# Patient Record
Sex: Female | Born: 1960
Health system: Southern US, Community
[De-identification: ages and names within clinical notes are randomized; demographics above are authoritative.]

## PROBLEM LIST (undated history)

## (undated) ENCOUNTER — Emergency Department (HOSPITAL_COMMUNITY): Discharge status: Absent without leave | Payer: No Typology Code available for payment source

## (undated) DIAGNOSIS — M179 Osteoarthritis of knee, unspecified: Secondary | ICD-10-CM

## (undated) DIAGNOSIS — R0600 Dyspnea, unspecified: Secondary | ICD-10-CM

## (undated) DIAGNOSIS — J449 Chronic obstructive pulmonary disease, unspecified: Secondary | ICD-10-CM

## (undated) DIAGNOSIS — G47419 Narcolepsy without cataplexy: Secondary | ICD-10-CM

## (undated) DIAGNOSIS — F319 Bipolar disorder, unspecified: Secondary | ICD-10-CM

## (undated) DIAGNOSIS — J189 Pneumonia, unspecified organism: Secondary | ICD-10-CM

## (undated) DIAGNOSIS — E876 Hypokalemia: Secondary | ICD-10-CM

## (undated) DIAGNOSIS — F1020 Alcohol dependence, uncomplicated: Secondary | ICD-10-CM

## (undated) DIAGNOSIS — M1990 Unspecified osteoarthritis, unspecified site: Secondary | ICD-10-CM

## (undated) DIAGNOSIS — I4891 Unspecified atrial fibrillation: Secondary | ICD-10-CM

## (undated) DIAGNOSIS — F419 Anxiety disorder, unspecified: Secondary | ICD-10-CM

## (undated) DIAGNOSIS — G35 Multiple sclerosis: Secondary | ICD-10-CM

## (undated) DIAGNOSIS — G43909 Migraine, unspecified, not intractable, without status migrainosus: Secondary | ICD-10-CM

## (undated) DIAGNOSIS — K7030 Alcoholic cirrhosis of liver without ascites: Secondary | ICD-10-CM

## (undated) DIAGNOSIS — M810 Age-related osteoporosis without current pathological fracture: Secondary | ICD-10-CM

## (undated) DIAGNOSIS — T7840XA Allergy, unspecified, initial encounter: Secondary | ICD-10-CM

## (undated) DIAGNOSIS — K219 Gastro-esophageal reflux disease without esophagitis: Secondary | ICD-10-CM

## (undated) DIAGNOSIS — F909 Attention-deficit hyperactivity disorder, unspecified type: Secondary | ICD-10-CM

## (undated) DIAGNOSIS — M1710 Unilateral primary osteoarthritis, unspecified knee: Secondary | ICD-10-CM

## (undated) DIAGNOSIS — Z8719 Personal history of other diseases of the digestive system: Secondary | ICD-10-CM

## (undated) DIAGNOSIS — N390 Urinary tract infection, site not specified: Secondary | ICD-10-CM

## (undated) HISTORY — PX: TONSILLECTOMY: SUR1361

## (undated) HISTORY — DX: Migraine, unspecified, not intractable, without status migrainosus: G43.909

## (undated) HISTORY — PX: FRACTURE SURGERY: SHX138

## (undated) HISTORY — DX: Age-related osteoporosis without current pathological fracture: M81.0

## (undated) HISTORY — PX: HERNIA REPAIR: SHX51

## (undated) HISTORY — PX: MYRINGOTOMY WITH TUBE PLACEMENT: SHX5663

## (undated) HISTORY — DX: Attention-deficit hyperactivity disorder, unspecified type: F90.9

---
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
## 1997-12-01 ENCOUNTER — Ambulatory Visit (HOSPITAL_COMMUNITY): Admission: RE | Admit: 1997-12-01 | Discharge: 1997-12-01 | Payer: Self-pay | Admitting: *Deleted

## 1997-12-09 ENCOUNTER — Ambulatory Visit (HOSPITAL_COMMUNITY): Admission: RE | Admit: 1997-12-09 | Discharge: 1997-12-09 | Payer: Self-pay | Admitting: Obstetrics & Gynecology

## 1997-12-11 ENCOUNTER — Ambulatory Visit (HOSPITAL_COMMUNITY): Admission: RE | Admit: 1997-12-11 | Discharge: 1997-12-11 | Payer: Self-pay

## 1998-01-05 ENCOUNTER — Ambulatory Visit (HOSPITAL_COMMUNITY): Admission: RE | Admit: 1998-01-05 | Discharge: 1998-01-05 | Payer: Self-pay

## 1998-06-03 ENCOUNTER — Encounter: Payer: Self-pay | Admitting: *Deleted

## 1998-06-03 ENCOUNTER — Ambulatory Visit (HOSPITAL_COMMUNITY): Admission: RE | Admit: 1998-06-03 | Discharge: 1998-06-03 | Payer: Self-pay | Admitting: *Deleted

## 1998-11-04 ENCOUNTER — Encounter (HOSPITAL_COMMUNITY): Admission: RE | Admit: 1998-11-04 | Discharge: 1998-12-20 | Payer: Self-pay | Admitting: *Deleted

## 1998-11-15 ENCOUNTER — Inpatient Hospital Stay (HOSPITAL_COMMUNITY): Admission: AD | Admit: 1998-11-15 | Discharge: 1998-11-15 | Payer: Self-pay | Admitting: Obstetrics

## 1998-12-18 ENCOUNTER — Inpatient Hospital Stay (HOSPITAL_COMMUNITY): Admission: AD | Admit: 1998-12-18 | Discharge: 1998-12-23 | Payer: Self-pay | Admitting: Obstetrics & Gynecology

## 1998-12-18 ENCOUNTER — Encounter (INDEPENDENT_AMBULATORY_CARE_PROVIDER_SITE_OTHER): Payer: Self-pay | Admitting: Specialist

## 1998-12-23 ENCOUNTER — Observation Stay (HOSPITAL_COMMUNITY): Admission: AD | Admit: 1998-12-23 | Discharge: 1998-12-24 | Payer: Self-pay

## 1998-12-25 ENCOUNTER — Encounter: Admission: RE | Admit: 1998-12-25 | Discharge: 1999-03-25 | Payer: Self-pay | Admitting: Obstetrics

## 1998-12-26 ENCOUNTER — Observation Stay (HOSPITAL_COMMUNITY): Admission: AD | Admit: 1998-12-26 | Discharge: 1998-12-27 | Payer: Self-pay | Admitting: *Deleted

## 1998-12-26 ENCOUNTER — Inpatient Hospital Stay (HOSPITAL_COMMUNITY): Admission: AD | Admit: 1998-12-26 | Discharge: 1998-12-27 | Payer: Self-pay | Admitting: *Deleted

## 1998-12-29 ENCOUNTER — Inpatient Hospital Stay (HOSPITAL_COMMUNITY): Admission: EM | Admit: 1998-12-29 | Discharge: 1998-12-29 | Payer: Self-pay | Admitting: Obstetrics & Gynecology

## 1999-02-06 ENCOUNTER — Inpatient Hospital Stay (HOSPITAL_COMMUNITY): Admission: AD | Admit: 1999-02-06 | Discharge: 1999-02-06 | Payer: Self-pay | Admitting: *Deleted

## 1999-02-06 ENCOUNTER — Encounter (INDEPENDENT_AMBULATORY_CARE_PROVIDER_SITE_OTHER): Payer: Self-pay | Admitting: *Deleted

## 1999-03-27 ENCOUNTER — Encounter: Admission: RE | Admit: 1999-03-27 | Discharge: 1999-06-25 | Payer: Self-pay | Admitting: Obstetrics

## 2000-08-18 ENCOUNTER — Ambulatory Visit: Admission: RE | Admit: 2000-08-18 | Discharge: 2000-08-18 | Payer: Self-pay | Admitting: *Deleted

## 2001-02-14 ENCOUNTER — Encounter (HOSPITAL_COMMUNITY): Admission: RE | Admit: 2001-02-14 | Discharge: 2001-05-15 | Payer: Self-pay | Admitting: *Deleted

## 2001-06-08 ENCOUNTER — Encounter (HOSPITAL_COMMUNITY): Admission: RE | Admit: 2001-06-08 | Discharge: 2001-09-06 | Payer: Self-pay | Admitting: *Deleted

## 2001-10-12 ENCOUNTER — Encounter (HOSPITAL_COMMUNITY): Admission: RE | Admit: 2001-10-12 | Discharge: 2001-10-16 | Payer: Self-pay | Admitting: *Deleted

## 2002-01-09 ENCOUNTER — Other Ambulatory Visit: Admission: RE | Admit: 2002-01-09 | Discharge: 2002-01-09 | Payer: Self-pay | Admitting: Family Medicine

## 2002-03-13 ENCOUNTER — Encounter (HOSPITAL_COMMUNITY): Admission: RE | Admit: 2002-03-13 | Discharge: 2002-06-11 | Payer: Self-pay | Admitting: *Deleted

## 2002-06-04 ENCOUNTER — Encounter: Payer: Self-pay | Admitting: Family Medicine

## 2002-06-04 ENCOUNTER — Encounter: Admission: RE | Admit: 2002-06-04 | Discharge: 2002-06-04 | Payer: Self-pay | Admitting: Family Medicine

## 2003-03-07 ENCOUNTER — Other Ambulatory Visit: Admission: RE | Admit: 2003-03-07 | Discharge: 2003-03-07 | Payer: Self-pay | Admitting: Family Medicine

## 2004-01-10 ENCOUNTER — Other Ambulatory Visit: Admission: RE | Admit: 2004-01-10 | Discharge: 2004-01-10 | Payer: Self-pay | Admitting: Obstetrics and Gynecology

## 2004-08-27 ENCOUNTER — Other Ambulatory Visit: Admission: RE | Admit: 2004-08-27 | Discharge: 2004-08-27 | Payer: Self-pay | Admitting: Family Medicine

## 2005-12-21 ENCOUNTER — Other Ambulatory Visit: Admission: RE | Admit: 2005-12-21 | Discharge: 2005-12-21 | Payer: Self-pay | Admitting: Family Medicine

## 2006-02-02 ENCOUNTER — Encounter: Admission: RE | Admit: 2006-02-02 | Discharge: 2006-02-02 | Payer: Self-pay | Admitting: Family Medicine

## 2006-05-04 ENCOUNTER — Ambulatory Visit: Payer: Self-pay | Admitting: Oncology

## 2006-08-16 ENCOUNTER — Ambulatory Visit: Admission: RE | Admit: 2006-08-16 | Discharge: 2006-08-16 | Payer: Self-pay | Admitting: Neurology

## 2007-03-21 ENCOUNTER — Other Ambulatory Visit: Admission: RE | Admit: 2007-03-21 | Discharge: 2007-03-21 | Payer: Self-pay | Admitting: Family Medicine

## 2007-04-25 ENCOUNTER — Ambulatory Visit: Admission: RE | Admit: 2007-04-25 | Discharge: 2007-04-25 | Payer: Self-pay | Admitting: Neurology

## 2008-02-27 ENCOUNTER — Ambulatory Visit: Admission: EM | Admit: 2008-02-27 | Discharge: 2008-02-27 | Payer: Self-pay | Admitting: Neurology

## 2008-10-21 ENCOUNTER — Other Ambulatory Visit: Admission: RE | Admit: 2008-10-21 | Discharge: 2008-10-21 | Payer: Self-pay | Admitting: Family Medicine

## 2009-01-07 ENCOUNTER — Ambulatory Visit (HOSPITAL_COMMUNITY): Admission: RE | Admit: 2009-01-07 | Discharge: 2009-01-07 | Payer: Self-pay | Admitting: Neurology

## 2009-02-05 ENCOUNTER — Ambulatory Visit (HOSPITAL_COMMUNITY): Admission: RE | Admit: 2009-02-05 | Discharge: 2009-02-05 | Payer: Self-pay | Admitting: Family Medicine

## 2009-04-22 ENCOUNTER — Ambulatory Visit: Admission: RE | Admit: 2009-04-22 | Discharge: 2009-04-22 | Payer: Self-pay | Admitting: Neurology

## 2009-12-16 ENCOUNTER — Ambulatory Visit (HOSPITAL_COMMUNITY): Admission: RE | Admit: 2009-12-16 | Discharge: 2009-12-16 | Payer: Self-pay | Admitting: Family Medicine

## 2009-12-24 ENCOUNTER — Encounter: Admission: RE | Admit: 2009-12-24 | Discharge: 2009-12-24 | Payer: Self-pay | Admitting: Family Medicine

## 2010-03-01 ENCOUNTER — Encounter: Payer: Self-pay | Admitting: Family Medicine

## 2010-04-07 ENCOUNTER — Ambulatory Visit (HOSPITAL_COMMUNITY)
Admission: RE | Admit: 2010-04-07 | Discharge: 2010-04-07 | Disposition: A | Discharge status: Home / Self Care | Payer: Self-pay | Source: Ambulatory Visit | Attending: Emergency Medicine | Admitting: Emergency Medicine

## 2010-04-07 DIAGNOSIS — I059 Rheumatic mitral valve disease, unspecified: Secondary | ICD-10-CM | POA: Insufficient documentation

## 2010-04-07 DIAGNOSIS — Z9221 Personal history of antineoplastic chemotherapy: Secondary | ICD-10-CM | POA: Insufficient documentation

## 2010-04-07 DIAGNOSIS — F17200 Nicotine dependence, unspecified, uncomplicated: Secondary | ICD-10-CM | POA: Insufficient documentation

## 2010-04-08 ENCOUNTER — Ambulatory Visit (HOSPITAL_COMMUNITY): Payer: Medicare Other

## 2010-05-11 LAB — CBC
HCT: 36.4 % (ref 36.0–46.0)
Hemoglobin: 12.5 g/dL (ref 12.0–15.0)
MCHC: 34.3 g/dL (ref 30.0–36.0)
MCV: 96.8 fL (ref 78.0–100.0)
Platelets: 158 10*3/uL (ref 150–400)
RBC: 3.76 MIL/uL — ABNORMAL LOW (ref 3.87–5.11)
RDW: 12.5 % (ref 11.5–15.5)
WBC: 5.4 10*3/uL (ref 4.0–10.5)

## 2010-05-11 LAB — URINALYSIS, ROUTINE W REFLEX MICROSCOPIC
Bilirubin Urine: NEGATIVE
Glucose, UA: NEGATIVE mg/dL
Hgb urine dipstick: NEGATIVE
Ketones, ur: NEGATIVE mg/dL
Nitrite: NEGATIVE
Protein, ur: NEGATIVE mg/dL
Specific Gravity, Urine: 1.01 (ref 1.005–1.030)
Urobilinogen, UA: 0.2 mg/dL (ref 0.0–1.0)
pH: 6 (ref 5.0–8.0)

## 2010-05-11 LAB — COMPREHENSIVE METABOLIC PANEL
ALT: 22 U/L (ref 0–35)
AST: 26 U/L (ref 0–37)
Albumin: 3.8 g/dL (ref 3.5–5.2)
Alkaline Phosphatase: 53 U/L (ref 39–117)
BUN: 9 mg/dL (ref 6–23)
CO2: 27 mEq/L (ref 19–32)
Calcium: 8.7 mg/dL (ref 8.4–10.5)
Chloride: 106 mEq/L (ref 96–112)
Creatinine, Ser: 0.57 mg/dL (ref 0.4–1.2)
GFR calc Af Amer: >60 mL/min (ref >60)
GFR calc non Af Amer: >60 mL/min (ref >60)
Glucose, Bld: 106 mg/dL — ABNORMAL HIGH (ref 70–99)
Potassium: 3.7 mEq/L (ref 3.5–5.1)
Sodium: 137 mEq/L (ref 135–145)
Total Bilirubin: 0.2 mg/dL — ABNORMAL LOW (ref 0.3–1.2)
Total Protein: 6.2 g/dL (ref 6.0–8.3)

## 2010-05-11 LAB — DIFFERENTIAL
Basophils Absolute: 0 10*3/uL (ref 0.0–0.1)
Basophils Relative: 1 % (ref 0–1)
Eosinophils Absolute: 0.1 10*3/uL (ref 0.0–0.7)
Eosinophils Relative: 3 % (ref 0–5)
Lymphocytes Relative: 28 % (ref 12–46)
Lymphs Abs: 1.5 10*3/uL (ref 0.7–4.0)
Monocytes Absolute: 0.4 10*3/uL (ref 0.1–1.0)
Monocytes Relative: 7 % (ref 3–12)
Neutro Abs: 3.3 10*3/uL (ref 1.7–7.7)
Neutrophils Relative %: 61 % (ref 43–77)

## 2010-06-26 NOTE — Op Note (Signed)
[facility]  Patient:    Anne Black                          MRN: [id number] Proc. Date: 12/20/98 Adm. Date:  [id number] Attending:  Antionette Char                           Operative Report  PREOPERATIVE DIAGNOSIS:       [redacted] weeks gestation, twins, induction of labor, arrest of descent, previous cesarean section.  POSTOPERATIVE DIAGNOSIS:      [redacted] weeks gestation, twins, induction of labor, arrest of descent, previous cesarean section.  OPERATION:                    Repeat low transverse cesarean section.  SURGEON:                      Charles A. Clearance Coots, M.D.  ASSISTANT:                    Reynolds Bowl  ANESTHESIA:                   Epidural.  ESTIMATED BLOOD LOSS:         800 cc.  IV FLUIDS:                    2000 cc.  URINE OUT:                    350 cc hemoconcentrated.  COMPLICATIONS:                None.  DRAINS:                       Foley to gravity.  FINDINGS:                     Baby A viable female at 2, Apgars of 8 at one minute and 9 at five minutes.  Weight of 6 pounds.  Cord pH of 7.33.  Baby B a viable female at 0341, Apgars of 8 at one minute and 9 at five minutes.  Weight of 5 pounds 6 ounces, cord pH of 7.27.  Normal uterus, ovaries, and fallopian tubes.  DESCRIPTION OF PROCEDURE:     The patient was brought to the operating room and  after satisfactory redosing of the epidural, the abdomen was prepped and draped in the usual sterile fashion.  A Pfannenstiel skin incision was made with the scalpel that was deepened down to the fascia with the scalpel.  The fascia was nicked in the midline and the fascial incision was extended to the left and to the right ith curved Mayo scissors.  The superior and inferior fascial edges were taken off of the rectus muscles with both blunt and sharp dissection.  The rectus muscle was  divided in the midline bluntly and sharply with curved Mayo scissors  being careful to avoid the urinary bladder inferiorly.  The peritoneum was entered digitally nd was digitally extended to the left and to the right.  The bladder blade was positioned.  The vesicouterine fold of peritoneum above the reflection of the urinary bladder was grasped with forceps and was incised and undermined with Metzenbaum scissors.  The incision was extended to the left and to the right with Metzenbaum scissors.  The bladder flap was bluntly developed and the bladder blade was repositioned in front of the urinary bladder placing it well out of the operative field.  The uterine was entered with short strokes of the scalpel. Moderate amount of clear amniotic fluid was expelled.  The uterine incision was  extended to the left and to the right with digital traction.  The vertex was delivered with the aid of fundal pressure from the assistant of baby A and the delivery was completed with the aid of fundal pressure from the assistant.  The  umbilical cord was clamped and cut and the infant was handed off to the nursery  staff.  The membranes were ruptured of baby B and the delivery was accomplished  breech extraction in routine fashion.  The umbilical cord was clamped and cut and the infant was handed off to the nursery staff.  Cord pH and cord blood were obtained and the placenta was spontaneously expelled from the uterine cavity intact.  The uterus was exteriorized and the endometrial surface was thoroughly  debrided with the dry lap sponge.  The edges of the uterine incision were grasped with ring forceps and the uterine was closed with a continuous interlocking suture of 0 Monocryl.  Hemostasis was excellent.  The uterus was placed back in its normal anatomic position.  The pelvic cavity was thoroughly irrigated with warm saline  solution and all clots were removed.  The abdomen was then closed as follows. he rectus muscle was approximated with several interrupted  sutures of 0 Monocryl. The fascia was closed with a continuous suture of 0 PDS from each corner to the center. The subcutaneous tissue was thoroughly irrigated with warm saline solution. All areas of subcutaneous bleeding were coagulated with the Bovie.  The skin was then approximated with surgical stainless steel staples.  A sterile bandage was applied to the incision closure.  The surgical technician indicated that all needle, sponge, and instrument counts were correct.  The patient tolerated the procedure well and was transported to the recovery room in satisfactory condition. DD:  12/20/98 TD:  12/22/98 Job: [id number]

## 2010-06-26 NOTE — Discharge Summary (Signed)
[facility]  Patient:    Anne Black                          MRN: [id number] Adm. Date:  [id number] Disc. Date: 12/23/98 Attending:  Antionette Black Dictator:   Anne Black, M.D.                           Discharge Summary  DISCHARGE DIAGNOSES: 1. Intrauterine pregnancy of twins delivered by cesarean section. 2. Postpartum anemia. 3. Repeat low transverse cesarean section. 4. Advanced maternal age. 5. History of multiple sclerosis.  DISCHARGE MEDICATIONS: 1. Ibuprofen 600 mg p.o. q.6-8h. p.r.n. 2. Tylox one or two tablets p.o. q.6h. p.r.n. breakthrough pain. 3. Prenatal vitamins one p.o. b.i.d. 4. Iron sulfate 325 mg p.o. b.i.d.  CONSULTATIONS:  None.  PROCEDURES:  Repeat low transverse cesarean section done on 12/20/98 by Dr. Clearance Black for arrest of descent.  ADMISSION HISTORY AND PHYSICAL:  Anne Black is a 50 year old G3, P 0-1-1-3, who  presented to [facility] [place] for induction of labor of twin gestation.  The patient is a VBAC after having had triplets in 1994.  PHYSICAL EXAMINATION:  VITAL SIGNS:  She is afebrile, blood pressure 117/84, heart rate 94.  CHEST:  Clear to auscultation bilaterally.  HEART:  Regular rate and rhythm, no murmurs, rubs, or gallops.  ABDOMEN:  Gravid, nontender.  EXTREMITIES:  There is 2+ deep tendon reflexes, 2+ pulses, bilateral trace edema, no clonus.  HOSPITAL COURSE:  The patient presented for induction of labor of twin gestation. The patient on her first night had PGI2 gel placed.  The next morning she was started on Pitocin.  The patient progressed to complete dilatation, however, after pushing for 1-1/2 hours she was diagnosed with arrest of descent and then taken to cesarean section.  The patient delivered viable twins.  Apgars on twin A were 8 at 1 minute 9 at 5 minutes.  Apgars on twin B were 8 at 1 minute and 9 at 5 minutes. The patient delivered viable baby  girls.  The patient did not have any postoperative complications.  The patient is rubella immune, blood type A+, she is breast-feeding, and the patient does not desire birth control.  Discharged to home.  CONDITION ON DISCHARGE:  Stable.  FOLLOWUP:  In six weeks at [facility] for routine postpartum care. DD:  12/23/98 TD:  12/23/98 Job: [id number]

## 2010-06-26 NOTE — Discharge Summary (Signed)
[facility]  Patient:    Anne Black                          MRN: [id number] Adm. Date:  [id number] Disc. Date: 12/24/98 Attending:  Antionette Char Dictator:   Amparo Bristol, M.D.                           Discharge Summary                                DISCHARGE SUMMARY  DISCHARGE DIAGNOSES:              1. Endometritis.                                   2. Postoperative day #4 status post Cesarean                                      section for delivery of twin gestation.                                   3. Borderline personality disorder.                                   4. Anemia.  DISCHARGE MEDICATIONS:            1. Clindamycin 900 mg intravenously q.8h. to e                                      instructed by [facility].                                   2. Gentamicin 160 mg intravenously q.8h. as                                      administered by [facility].                                   3. Prenatal vitamin one p.o. q.d.                                   4. Ibuprofen 600 mg p.o. q.6-8h. p.r.n. pain.                                   5. Tylox p.o. q.6h. p.r.n. breakthrough pain.                                   6. Iron as previously  prescribed.  COMPLICATIONS:                    None.  PROCEDURES:                       None.  ADMISSION HISTORY AND PHYSICAL:                         Ms. Wesche is a 50 year old Caucasian woman discharged from the hospital earlier in the morning who presented with a fever f 101.5 as taken by the home health nurse.  The patient also came in extremely anxious and having multiple other complaints, including shortness of breath. The patient is a smoker.  PHYSICAL EXAMINATION:  VITAL SIGNS:                      Temperature 100.4, heart rate 96, respiratory  rate 22, blood pressure 130/62. GENERAL:                          She is a frantic, tearful female.  LUNGS:                             Clear to auscultation bilaterally, no wheezes, no focal deficits.  HEART:                            Regular rate and rhythm, normal S1, S2 with 2/6 systolic ejection murmur.  BREASTS:                          Nonengorged, nontender, cool.  BACK:                             No CVA tenderness.  ABDOMEN:                          Fundus firm, at umbilicus, appropriately tender, nondistended, no masses, incision clean, dry and intact without signs or symptoms of infection.  EXTREMITIES:                      There is 3+ pitting edema bilaterally lower extremity, no hand edema, face is normal.  ASSESSMENT/PLAN:                  The patient had a postpartum endometritis.  HOSPITAL COURSE: #1 - ENDOMETRITIS:                 The patient was admitted for IV gentamicin and clindamycin.  Her white count came back as 6.1, hemoglobin 8.8, hematocrit 25.6. The patient continued to have fever spikes in the hospital of 103.9.  By time of discharge, her temperature was 100.6.  On November 15, the patient was demanded to go home and threatening to leave against medical advice if we did not arrange an alternative to in-hospital antibiotics administration.  After discussion with the patient of the risks of being out of the hospital instead of in the hospital, the patient was still demanding to leave.  At this point we arranged home health services to administer antibiotics and to teach patient how to hang her antibiotics at the scheduled q.8h. intervals.  FOLLOWUP:  The patient is to return to maternity admission on Friday, November 17, for a wound check and temperature review.  CONDITION ON DISCHARGE:           Stable maternal discharge to home. DD:  12/24/98 TD:  12/24/98 Job: [id number]

## 2010-09-22 ENCOUNTER — Ambulatory Visit (HOSPITAL_COMMUNITY): Payer: Self-pay

## 2010-11-12 ENCOUNTER — Ambulatory Visit (HOSPITAL_COMMUNITY): Payer: Self-pay

## 2010-11-17 ENCOUNTER — Other Ambulatory Visit (HOSPITAL_COMMUNITY): Payer: Self-pay

## 2010-11-19 ENCOUNTER — Ambulatory Visit (HOSPITAL_COMMUNITY): Payer: BC Managed Care – PPO | Attending: Neurology

## 2011-04-03 ENCOUNTER — Encounter (HOSPITAL_COMMUNITY): Payer: Self-pay | Admitting: Emergency Medicine

## 2011-04-03 ENCOUNTER — Emergency Department (HOSPITAL_COMMUNITY)
Admission: EM | Admit: 2011-04-03 | Discharge: 2011-04-03 | Disposition: A | Discharge status: Home / Self Care | Payer: BC Managed Care – PPO | Attending: Emergency Medicine | Admitting: Emergency Medicine

## 2011-04-03 DIAGNOSIS — IMO0001 Reserved for inherently not codable concepts without codable children: Secondary | ICD-10-CM | POA: Insufficient documentation

## 2011-04-03 DIAGNOSIS — G35 Multiple sclerosis: Secondary | ICD-10-CM | POA: Insufficient documentation

## 2011-04-03 DIAGNOSIS — M549 Dorsalgia, unspecified: Secondary | ICD-10-CM | POA: Insufficient documentation

## 2011-04-03 HISTORY — DX: Unspecified osteoarthritis, unspecified site: M19.90

## 2011-04-03 HISTORY — DX: Multiple sclerosis: G35

## 2011-04-03 MED ORDER — TRAMADOL HCL 50 MG PO TABS: 50.0000 mg | ORAL_TABLET | Freq: Four times a day (QID) | ORAL | Status: AC | PRN

## 2011-04-03 NOTE — ED Notes (Signed)
Pt states she has MS and arthritis, pt c/o pain to spine and neck, legs. pt states she is between neurologist. Pt states she tried 1/2 Lortab today with no relief. Pt also has Lyrica but does not want to take full dose for pain. Pt would like steroids to help pain. Pt states she was offered injections for MS, will consider that option.

## 2011-04-03 NOTE — Discharge Instructions (Signed)
Multiple Sclerosis Multiple sclerosis (MS) is a disease of the central nervous system. Its cause is unknown. It is more common in the [place] states than in the [place] states. There is a higher incidence of MS in women. There is a wide variation in the symptoms (problems) of MS. This is because of the many different ways it affects the central nervous system. It often comes on in episodes or attacks. These attacks may last weeks to months. There may be long periods of nearly no problems between attacks. The main symptoms include visual problems (associated with eye pain), numbness, weakness, and paralysis in extremities (arms/hands and legs/feet). There may also be tremors and problems with balance and walking. The age when MS starts is variable. Advances in medicine continue to improve the treatment of this illness. There is no known cure for MS but there are medications that help. MS is not an inherited illness, although your risk of getting this disease is higher if you have a relative with MS. The best radiologic (x-ray) study for MS is an MRI (magnetic resonance imaging). There are medications available to decrease the number and frequency of attacks. SYMPTOMS  The symptoms of MS are caused by loss of insulation (myelin) of the nerves of the brain. When this happens, brain signals do not get transmitted properly or may not get transmitted at all. Some of the problems caused by this include:   Numbness.   Weakness.   Paralysis in extremities.   Visual problems, eye pain.   Balance problems.   Tremors.  DIAGNOSIS  Your caregiver can do studies on you to make this diagnosis. This may include specialized X-rays and spinal fluid studies. HOME CARE INSTRUCTIONS   Take medications as directed by your caregiver. Baclofen is a drug commonly used to reduce muscle spasticity. Steroids are often used for short term relief.   Exercise as directed.   Use physical and occupational therapy as  directed by your caregiver. Careful attention to this medical care can help avoid depression.   See your caregiver if you begin to have problems with depression. This is a common problem in MS. Patients often continue to work many years after the diagnosis of MS.  Document Released: 01/23/2000 Document Revised: 10/07/2010 Document Reviewed: 08/31/2006 [facility] Patient Information 2012 [place], [place].

## 2011-04-03 NOTE — ED Notes (Signed)
MD at bedside. 

## 2011-04-03 NOTE — ED Notes (Signed)
PA is currently with patient.  Patient has multiple c/o.  Scattered thoughts.  Has not been taking her MS medications as prescribed.  Is currently seeking full round of MS treatment.  States that she is in between neurologists at this time.  The reason she came in today is that she wants something to take away the burning sensation and wants meds that she feels that she can take that won't make her groggy.

## 2011-04-15 NOTE — ED Provider Notes (Signed)
History     CSN: [id number]  Arrival date & time 04/03/11  Anne Black   First MD Initiated Contact with Patient 04/03/11 2033      Chief Complaint  Patient presents with  . Muscle Pain    (Consider location/radiation/quality/duration/timing/severity/associated sxs/prior treatment) Patient is a 51 y.o. female presenting with musculoskeletal pain. The history is provided by the patient.  Muscle Pain This is a recurrent problem. Pertinent negatives include no chest pain, no abdominal pain, no headaches and no shortness of breath.   patient states she has recurrent muscle pain. She states this is an MS flare for her. She has a neurologist that she had been seen, but she states she will not see them anymore because she wanted different treatments and they wanted to give her. Her primary care doctor is reportedly been giving her steroids. She states that she wants IV steroids here now with her MS. No fevers. No localized numbness or weakness. She tried Lortab at home without relief  Past Medical History  Diagnosis Date  . Multiple sclerosis   . Arthritis     Past Surgical History  Procedure Date  . Cesarean section     No family history on file.  History  Substance Use Topics  . Smoking status: Current Everyday Smoker  . Smokeless tobacco: Not on file  . Alcohol Use: Yes     occasional    OB History    Grav Para Term Preterm Abortions TAB SAB Ect Mult Living                  Review of Systems  Constitutional: Negative for activity change and appetite change.  HENT: Negative for neck stiffness.   Eyes: Negative for pain.  Respiratory: Negative for chest tightness and shortness of breath.   Cardiovascular: Negative for chest pain and leg swelling.  Gastrointestinal: Negative for nausea, vomiting, abdominal pain and diarrhea.  Genitourinary: Negative for flank pain.  Musculoskeletal: Positive for myalgias and back pain.  Skin: Negative for rash.  Neurological: Negative for  weakness, numbness and headaches.  Psychiatric/Behavioral: Negative for behavioral problems.    Allergies  Review of patient's allergies indicates no known allergies.  Home Medications   Current Outpatient Rx  Name Route Sig Dispense Refill  . CEFUROXIME AXETIL 250 MG PO TABS Oral Take 250 mg by mouth 2 (two) times daily. Course started 03/26/11 for 14 day supply    . HYDROCODONE-ACETAMINOPHEN 5-325 MG PO TABS Oral Take 1 tablet by mouth every 6 (six) hours as needed. pain    . LISDEXAMFETAMINE DIMESYLATE 50 MG PO CAPS Oral Take 50 mg by mouth every morning.    Marland Kitchen MELOXICAM 15 MG PO TABS Oral Take 15 mg by mouth daily.    Marland Kitchen MODAFINIL 200 MG PO TABS Oral Take 200-400 mg by mouth daily as needed.     Marland Kitchen OMEPRAZOLE 20 MG PO CPDR Oral Take 20 mg by mouth daily.      BP 137/65  Pulse 83  Temp(Src) 97.5 F (36.4 C) (Oral)  Resp 16  Wt 113 lb (51.256 kg)  SpO2 100%  Physical Exam  Nursing note and vitals reviewed. Constitutional: She is oriented to person, place, and time. She appears well-developed and well-nourished.  HENT:  Head: Normocephalic and atraumatic.  Eyes: EOM are normal. Pupils are equal, round, and reactive to light.  Neck: Normal range of motion. Neck supple.  Cardiovascular: Normal rate, regular rhythm and normal heart sounds.   No murmur heard.  Pulmonary/Chest: Effort normal and breath sounds normal. No respiratory distress. She has no wheezes. She has no rales.  Abdominal: Soft. Bowel sounds are normal. She exhibits no distension. There is no tenderness. There is no rebound and no guarding.  Musculoskeletal: Normal range of motion.  Neurological: She is alert and oriented to person, place, and time. No cranial nerve deficit.  Skin: Skin is warm and dry.  Psychiatric: She has a normal mood and affect. Her speech is normal.    ED Course  Procedures (including critical care time)  Labs Reviewed - No data to display No results found.   1. Multiple sclerosis  exacerbation       MDM  Patient is requesting treatment for her MS. He states that she needs IV steroids. Patient was informed this was not needed acutely in the ER. I discussed the case with neurology. I recommended just following it and the neurology office. They told us not to give steroids to the patient. Patient was discharged        Juliet Rude. Rubin Payor, MD 04/15/11 [id number]

## 2011-05-17 ENCOUNTER — Encounter (HOSPITAL_COMMUNITY): Payer: Self-pay

## 2011-05-17 ENCOUNTER — Emergency Department (HOSPITAL_COMMUNITY)
Admission: EM | Admit: 2011-05-17 | Discharge: 2011-05-20 | Disposition: A | Discharge status: Home / Self Care | Payer: BC Managed Care – PPO | Attending: Emergency Medicine | Admitting: Emergency Medicine

## 2011-05-17 DIAGNOSIS — G35 Multiple sclerosis: Secondary | ICD-10-CM | POA: Insufficient documentation

## 2011-05-17 DIAGNOSIS — F329 Major depressive disorder, single episode, unspecified: Secondary | ICD-10-CM | POA: Insufficient documentation

## 2011-05-17 DIAGNOSIS — F17200 Nicotine dependence, unspecified, uncomplicated: Secondary | ICD-10-CM | POA: Insufficient documentation

## 2011-05-17 DIAGNOSIS — F3289 Other specified depressive episodes: Secondary | ICD-10-CM | POA: Insufficient documentation

## 2011-05-17 HISTORY — DX: Anxiety disorder, unspecified: F41.9

## 2011-05-17 LAB — COMPREHENSIVE METABOLIC PANEL
AST: 18 U/L (ref 0–37)
Albumin: 3.7 g/dL (ref 3.5–5.2)
BUN: 10 mg/dL (ref 6–23)
Calcium: 9.1 mg/dL (ref 8.4–10.5)
Chloride: 103 mEq/L (ref 96–112)
Creatinine, Ser: 0.44 mg/dL — ABNORMAL LOW (ref 0.50–1.10)
Total Bilirubin: 0.3 mg/dL (ref 0.3–1.2)
Total Protein: 6.3 g/dL (ref 6.0–8.3)

## 2011-05-17 LAB — URINALYSIS, ROUTINE W REFLEX MICROSCOPIC
Bilirubin Urine: NEGATIVE
Ketones, ur: NEGATIVE mg/dL
Leukocytes, UA: NEGATIVE
Nitrite: NEGATIVE
Protein, ur: NEGATIVE mg/dL

## 2011-05-17 LAB — CBC
HCT: 37.3 % (ref 36.0–46.0)
Hemoglobin: 13 g/dL (ref 12.0–15.0)
MCH: 32.3 pg (ref 26.0–34.0)
MCV: 92.6 fL (ref 78.0–100.0)
RBC: 4.03 MIL/uL (ref 3.87–5.11)
WBC: 9.5 10*3/uL (ref 4.0–10.5)

## 2011-05-17 LAB — RAPID URINE DRUG SCREEN, HOSP PERFORMED
Amphetamines: NOT DETECTED
Barbiturates: NOT DETECTED
Benzodiazepines: NOT DETECTED
Cocaine: NOT DETECTED
Tetrahydrocannabinol: NOT DETECTED

## 2011-05-17 LAB — SALICYLATE LEVEL: Salicylate Lvl: <2 mg/dL — ABNORMAL LOW (ref 2.8–20.0)

## 2011-05-17 MED ORDER — LISDEXAMFETAMINE DIMESYLATE 50 MG PO CAPS
50.0000 mg | ORAL_CAPSULE | ORAL | Status: DC
  Administered 2011-05-18 (×2): 50 mg via ORAL
  Administered 2011-05-19: 20 mg via ORAL

## 2011-05-17 MED ORDER — LORAZEPAM 1 MG PO TABS: 1.0000 mg | ORAL_TABLET | Freq: Three times a day (TID) | ORAL | Status: DC | PRN

## 2011-05-17 MED ORDER — PANTOPRAZOLE SODIUM 40 MG PO TBEC
40.0000 mg | DELAYED_RELEASE_TABLET | Freq: Every day | ORAL | Status: DC
  Administered 2011-05-20: 40 mg via ORAL
  Filled 2011-05-17 (×4): qty 1

## 2011-05-17 MED ORDER — LORAZEPAM 1 MG PO TABS
2.0000 mg | ORAL_TABLET | Freq: Four times a day (QID) | ORAL | Status: DC | PRN
  Administered 2011-05-17: 1 mg via ORAL
  Administered 2011-05-18 – 2011-05-19 (×4): 2 mg via ORAL
  Administered 2011-05-20 (×2): 1 mg via ORAL
  Filled 2011-05-17 (×2): qty 2
  Filled 2011-05-17: qty 1
  Filled 2011-05-17: qty 2
  Filled 2011-05-17: qty 1
  Filled 2011-05-17: qty 2
  Filled 2011-05-17: qty 1
  Filled 2011-05-17: qty 2

## 2011-05-17 MED ORDER — ACETAMINOPHEN 325 MG PO TABS
650.0000 mg | ORAL_TABLET | Freq: Once | ORAL | Status: AC
  Administered 2011-05-17: 650 mg via ORAL
  Filled 2011-05-17: qty 2

## 2011-05-17 MED ORDER — ZIPRASIDONE MESYLATE 20 MG IM SOLR
10.0000 mg | Freq: Once | INTRAMUSCULAR | Status: AC
  Administered 2011-05-17: 10 mg via INTRAMUSCULAR
  Filled 2011-05-17: qty 20

## 2011-05-17 MED ORDER — ONDANSETRON HCL 4 MG PO TABS: 4.0000 mg | ORAL_TABLET | Freq: Three times a day (TID) | ORAL | Status: DC | PRN

## 2011-05-17 MED ORDER — MELOXICAM 15 MG PO TABS
15.0000 mg | ORAL_TABLET | Freq: Every day | ORAL | Status: DC
  Administered 2011-05-18 – 2011-05-20 (×3): 15 mg via ORAL
  Filled 2011-05-17 (×5): qty 1

## 2011-05-17 MED ORDER — NICOTINE 21 MG/24HR TD PT24
21.0000 mg | MEDICATED_PATCH | Freq: Every day | TRANSDERMAL | Status: DC
  Administered 2011-05-17 – 2011-05-20 (×4): 21 mg via TRANSDERMAL
  Filled 2011-05-17 (×4): qty 1

## 2011-05-17 MED ORDER — ALUM & MAG HYDROXIDE-SIMETH 200-200-20 MG/5ML PO SUSP: 30.0000 mL | ORAL | Status: DC | PRN

## 2011-05-17 MED ORDER — ACETAMINOPHEN 325 MG PO TABS
650.0000 mg | ORAL_TABLET | ORAL | Status: DC | PRN
  Administered 2011-05-17 – 2011-05-20 (×5): 650 mg via ORAL
  Filled 2011-05-17 (×5): qty 2

## 2011-05-17 NOTE — ED Notes (Signed)
Pt requiring constant redirection to room.  Has a sock tied in her hair.  Requesting pain patch and nicotine patch.  Also requesting to see psychiatry and EDP  Will not say why she wants to see EDP.  Security contacted to help redirect pt back into room.

## 2011-05-17 NOTE — ED Provider Notes (Signed)
History     CSN: [id number]  Arrival date & time 05/17/11  0127   First MD Initiated Contact with Patient 05/17/11 0247      Chief Complaint  Patient presents with  . Medical Clearance    (Consider location/radiation/quality/duration/timing/severity/associated sxs/prior treatment) HPI Hx from pt and mobile crisis team. 51 year old female with past medical history of anxiety and MS presents for medical clearance. Per the mobile crisis team member, she apparently has been calling the mobile crisis hotline frequently over the past several weeks, due to family issues (there is currently a CPS investigation for her 65 year old twin daughters). She has expressed passive suicidal ideation on several occasions but has not expressed a plan. She has not expressed any homicidal ideation. She has contacted mobile crisis several times over the past 24 hours expressing the need for help then saying she doesn't need help; the team went to her home to perform an assessment and found the home quite disorganized. She was noted to be physically unkempt with frequent tangential thoughts. She seemed to be unaware, per the crisis team, of her declining mental state. There was concern for her ability to care for herself safely, so involuntary commitment paperwork was taken out.  The patient does have a recent history of involuntary commitment at [facility] for the same; she had a three day inpatient stay.  She is intermittently cooperative with exam; she expresses frustration at the mobile crisis worker stating she feels as if "my rights have been taken away." She states "I'm not quite sure why I'm here."  Past Medical History  Diagnosis Date  . Multiple sclerosis   . Arthritis   . Anxiety     Past Surgical History  Procedure Date  . Cesarean section     No family history on file.  History  Substance Use Topics  . Smoking status: Current Everyday Smoker  . Smokeless tobacco: Not on file  .  Alcohol Use: Yes     occasional    OB History    Grav Para Term Preterm Abortions TAB SAB Ect Mult Living                  Review of Systems  Constitutional: Negative for fever, chills, activity change and appetite change.  HENT: Negative for congestion, sore throat and trouble swallowing.   Respiratory: Positive for chest tightness. Negative for shortness of breath.        Episodic chest tightness from anxiety  Gastrointestinal: Negative for nausea, vomiting and abdominal pain.  Musculoskeletal: Positive for myalgias.       Intermittent generalized myalgias which she attributes to her MS  Skin: Negative for color change and rash.  Neurological: Negative for dizziness, weakness and headaches.    Allergies  Review of patient's allergies indicates no known allergies.  Home Medications   Current Outpatient Rx  Name Route Sig Dispense Refill  . CALCIUM CARBONATE 1250 MG PO TABS Oral Take 1 tablet by mouth daily.    Marland Kitchen LISDEXAMFETAMINE DIMESYLATE 50 MG PO CAPS Oral Take 50 mg by mouth every morning.    Marland Kitchen MELOXICAM 15 MG PO TABS Oral Take 15 mg by mouth daily.    Marland Kitchen MODAFINIL 200 MG PO TABS Oral Take 200-400 mg by mouth daily as needed.     Marland Kitchen OMEPRAZOLE 20 MG PO CPDR Oral Take 20 mg by mouth daily.      BP 111/51  Pulse 87  Temp(Src) 98.7 F (37.1 C) (Oral)  Resp 19  Ht 5\' 3"  (1.6 m)  Wt 115 lb (52.164 kg)  BMI 20.37 kg/m2  SpO2 97%  Physical Exam  Nursing note and vitals reviewed. Constitutional: She appears well-developed and well-nourished. No distress.  HENT:  Head: Normocephalic and atraumatic.  Eyes: EOM are normal. Pupils are equal, round, and reactive to light.  Neck: Normal range of motion.  Cardiovascular: Normal rate, regular rhythm and normal heart sounds.   Pulmonary/Chest: Effort normal and breath sounds normal. She exhibits no tenderness.  Abdominal: Soft. Bowel sounds are normal. There is no tenderness.  Musculoskeletal: Normal range of motion.    Neurological: She is alert.  Skin: Skin is warm and dry. She is not diaphoretic.  Psychiatric: Her affect is labile. Her speech is tangential.       Labile mood, poor insight, unkempt    ED Course  Procedures (including critical care time)  Labs Reviewed  COMPREHENSIVE METABOLIC PANEL - Abnormal; Notable for the following:    Potassium 3.4 (*)    Glucose, Bld 105 (*)    Creatinine, Ser 0.44 (*)    All other components within normal limits  CBC  ETHANOL  URINE RAPID DRUG SCREEN (HOSP PERFORMED)  URINALYSIS, ROUTINE W REFLEX MICROSCOPIC   No results found.   No diagnosis found.    MDM  0430 Patient was not cooperative with exam and refused to let me examine her, stating "You guys keep waking me up. I have to sleep right now."  5:40 AM Patient allowed me to perform exam at this time. She is noted to have poor insight, labile mood, unkempt appearance. Mobile crisis working on placement. Telepsych ordered. Pt medically clear for assessment.  7:14 AM I've talked with telepsychiatrist who will see.      [place], [place] 05/17/11 [phone]

## 2011-05-17 NOTE — ED Notes (Signed)
Nadean Corwin, RN from [facility], called stating mobile crisis was trying to get pt placed there. Report given. RN sts pt will be discussed with physician and their [facility] dept will call back with plan.

## 2011-05-17 NOTE — ED Notes (Signed)
RN in to see pt. Pt requests decaf coffee, then asks for tylenol. Explained to pt she had received it just before 7 this am. Pt says "no, I didn't. What color was it? How many did I get?" Explained that the RN from previous shift medicated pt and I was not present for it. Explained that we were waiting on orders from the psychiatrist. Pt sts "he wasn't a psychiatrist, he can't write orders, or are you confused on the difference that and a psychological examiner?" Informed pt I would inform her of plan of care after orders were received. Sitter at bedside. Pt manipulative, asking continually, different people for her belongings, requests.

## 2011-05-17 NOTE — ED Notes (Signed)
Pt admits to suicidall thought but no plan

## 2011-05-17 NOTE — ED Notes (Signed)
Labs faxed to [facility].
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
## 2011-05-17 NOTE — ED Notes (Signed)
Assumed care of pt.  Attempted to assess pt but she states she is "too sedated to answer any questions"  Pt was alert and speaking, saying she is ready to leave.  Oriented to dept for phone, visitors and bathroom.  Had no other request at this time.
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
## 2011-05-17 NOTE — ED Notes (Signed)
Went to Bed 15 to assess patient. No patient found in room. Patient remains in triage area at this time.

## 2011-05-17 NOTE — ED Notes (Signed)
Pt repeatedly redirected back to room.  Given water upon request.  States she is too sedated to answer questions but continues to ask multiple questions.  Security with pt.

## 2011-05-17 NOTE — ED Notes (Signed)
Pt continuing to be manipulative and crying loudly. Pt medicated per orders. Pt calling this RN mean, hateful. Wanted to know the class of medication, side effects, etc before being given IM injection. Pt then pulled RN's arm away when trying to place a band-aid. Security was at bedside for assistance.

## 2011-05-17 NOTE — ED Notes (Addendum)
Pt out to desk requesting nursing supervisor. Sts she does not need to be here, has things she needs to do at home. Offered that pt could speak to charge RN, sts that won't do any good." Pt remains argumentative at nurses station, stating she needs exercise and a neurologist. Pt informed this was not available at this time due to protocols. Pt made one of three 5 minute phone call.

## 2011-05-17 NOTE — ED Notes (Signed)
Per Minerva Areola at [facility], pt information will need to be run again in the a.m. Due to no appropriate beds available this evening. Jan with [facility] mobile crisis made aware.

## 2011-05-17 NOTE — ED Notes (Signed)
Called [facility] due to no report being received after pt completed consult. [facility] sts report was completed and will fax report to RN.

## 2011-05-17 NOTE — ED Notes (Signed)
Pt continuing to argue that she has not seen a psychiatrist and asking to see another MD "to get me out of here." Explained to pt that who she saw was who would make the final decision about her plan of care. Pt then stopped talking to RN, put her hand up and said "ok, you're right, you can be right." When questioned about why she called mobile crisis, she stated "that was personal issues about relationships that you can't help." When RN exited room, pt began crying loudly to let her see her daughters and to get her home. Sitter remains at bedside.

## 2011-05-18 MED ORDER — LISDEXAMFETAMINE DIMESYLATE 30 MG PO CAPS
ORAL_CAPSULE | ORAL | Status: AC
  Filled 2011-05-18: qty 1

## 2011-05-18 MED ORDER — LISDEXAMFETAMINE DIMESYLATE 30 MG PO CAPS
ORAL_CAPSULE | ORAL | Status: AC
  Administered 2011-05-18: 50 mg via ORAL
  Filled 2011-05-18: qty 1

## 2011-05-18 MED ORDER — LISDEXAMFETAMINE DIMESYLATE 20 MG PO CAPS
ORAL_CAPSULE | ORAL | Status: AC
  Administered 2011-05-18: 50 mg via ORAL
  Filled 2011-05-18: qty 1

## 2011-05-18 MED ORDER — LISDEXAMFETAMINE DIMESYLATE 20 MG PO CAPS
ORAL_CAPSULE | ORAL | Status: AC
  Filled 2011-05-18: qty 1

## 2011-05-18 NOTE — ED Notes (Signed)
Pt continues to argue with staff regarding her care and phone usage.  Refuses to stay in room, wandering in dept and loitering at nurses desk.  Redirected to room.  Security with pt.

## 2011-05-18 NOTE — ED Notes (Signed)
Pt states she fell in the bathroom hitting her right knee on the floor.  Denies pain in knee but requested an ice pack for her right hip for her MS.  Ice pack given per request.  Knee examined by this nurse.  No redness, swelling or tenderness noted.

## 2011-05-18 NOTE — ED Provider Notes (Signed)
Filed Vitals:   05/18/11 0556  BP: 115/70  Pulse: 75  Temp: 98.6 F (37 C)  Resp: 16    Telepsych recommendation for admission. No other recommendation provided. Patient continues to have scattered thoughts. When necessary Ativan used to control symptoms insight daily. Awaiting placement.  Cyndra Numbers, MD 05/18/11 1136

## 2011-05-18 NOTE — ED Notes (Signed)
Patient did not take shower as planned. Patient seems confused and stated "I have to think about what I want to do". Told patient to let me know when she decided to take her shower.
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
## 2011-05-18 NOTE — ED Notes (Signed)
Patient requested to take a shower. Gave patient supplies. Patient went into the restroom and came out stating she had fell on her knee because the floor was wet. Went into restroom and floor was damp from previous patient shower. Told patient that her scrubs were dry so we were thankful that she didn't fall. Patient stated "these are waterproof clothes". Explained to patient that if she had fell her scrubs would indeed be wet. Patient agreed.

## 2011-05-18 NOTE — ED Notes (Signed)
Child Protection Services in with patient now.

## 2011-05-18 NOTE — Progress Notes (Signed)
Talked to pt in hallway, introduced self as counseling intern and told pt about behavioral health groups in psych ED. Invited pt to come to group tomorrow (Wed) at 2:30pm if she was still here. Pt stated that she had requested chaplain several days ago and one was never called, requested a chaplain be called so she could talk to someone. Pt stated she would think about groups but was unsure. Pt appeared slightly agitated but was cooperative, engaged, and easily redirected/verbally de-escalated via couns joining w/ pt and using low/slow communication and problem-solving approach (re: what do you need, what can I do).  Kennedee Kitzmiller B MS, LPCA, NCC

## 2011-05-18 NOTE — BH Assessment (Signed)
Mobile crises worker was here yesterday working on placement. Per mobile crisis, pt has been declined at [facility].  Per shift report, Minerva Areola @[facility]  stated that patient can be run again today, no 400 halls beds available at this time  Spoke to Debbie-dis batcher for mobile crises this am and she will have one of their staff members come to continue working on patients placement/disposition.

## 2011-05-19 MED ORDER — LISDEXAMFETAMINE DIMESYLATE 20 MG PO CAPS
ORAL_CAPSULE | ORAL | Status: AC
  Administered 2011-05-19: 20 mg via ORAL
  Filled 2011-05-19: qty 1

## 2011-05-19 MED ORDER — ZIPRASIDONE MESYLATE 20 MG IM SOLR
10.0000 mg | Freq: Once | INTRAMUSCULAR | Status: AC
  Administered 2011-05-19: 10 mg via INTRAMUSCULAR
  Filled 2011-05-19: qty 20

## 2011-05-19 MED ORDER — ZIPRASIDONE MESYLATE 20 MG IM SOLR
INTRAMUSCULAR | Status: AC
  Filled 2011-05-19: qty 20

## 2011-05-19 MED ORDER — LISDEXAMFETAMINE DIMESYLATE 30 MG PO CAPS
ORAL_CAPSULE | ORAL | Status: AC
  Administered 2011-05-19: 80 mg
  Filled 2011-05-19: qty 1

## 2011-05-19 NOTE — ED Notes (Signed)
Pt redirected back to room.

## 2011-05-19 NOTE — ED Notes (Signed)
Pt up to nsg station asking questions about medications. When given answers, she became argumentative. She was not redirectable until staff escorted her back to her room. She asked to speak with nsg supervisor. When informed charge nurse not available immediately, she became even more argumentative, but eventually went to her room with staff assistance. Will continue to monitor.

## 2011-05-19 NOTE — ED Notes (Signed)
Pt continually coming to nurses station asking for coffee, hot tea, and boullion.  Pt informed that she will get coffee with her breakfast tray.  Was given boullion but was trying to get the attention of Dr. Ranae Palms.  Was talking over nurses to get his attention.  Had to be physically redirected to her room.

## 2011-05-19 NOTE — ED Notes (Signed)
Pt redirected back to room.  At desk asking to use phone.

## 2011-05-19 NOTE — Progress Notes (Signed)
Psych ED Behavioral Health Group   Co-facilitated [facility] group in psych ED w/ chaplain [facility]. Group focused on emotion regulation and mindfulness in dealing w/ overwhelming and distressing emotions. Group was active and engaged w/ open sharing and support. Facilitated DBT-based mindfulness activity w/ group to teach members to focus on bodily reactions and accept them nonjudgmentally.   Pt was engaged in the group, reported that she became overwhelmed easily and stressed. Pt speech was pressured and rapid, pt attempted to monopolize group several times AEB talking at length and by making others' stories about her. Pt did provide support to other group members. Pt engaged in mindfulness activity and reported that she found it helpful. Pt repeatedly asked co-facilitators for external resources and volunteer opportunities for her and her kids after discharge. Pt had to be frequently redirected, but was polite and engaged.  Selig Wampole B MS, LPCA, NCC
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
## 2011-05-19 NOTE — ED Provider Notes (Signed)
Medical screening examination/treatment/procedure(s) were performed by non-physician practitioner and as supervising physician I was immediately available for consultation/collaboration.   Vida Roller, MD 05/19/11 [phone]

## 2011-05-19 NOTE — ED Provider Notes (Addendum)
BP 94/60  Pulse 81  Temp(Src) 98.3 F (36.8 C) (Oral)  Resp 18  Ht 5\' 3"  (1.6 m)  Wt 115 lb (52.164 kg)  BMI 20.37 kg/m2  SpO2 96% Ambulating in hallways. Denies SI. Awaiting placement at [facility]  Loren Racer, MD 05/19/11 0757  BP 115/75  Pulse 77  Temp(Src) 98.6 F (37 C) (Oral)  Resp 16  Ht 5\' 3"  (1.6 m)  Wt 115 lb (52.164 kg)  BMI 20.37 kg/m2  SpO2 99% Ambulatory with no limp or sign of pain though complaining to nursing staff of hip pain. IVC in place. Pending placement.   Loren Racer, MD 05/20/11 [phone]

## 2011-05-19 NOTE — ED Notes (Signed)
Patient up to nurses station offering to give an in-service on communicating effectively. Explained to patient that we were not able to allow that. Patient states "You all need to be able to communicate with me." Tried to redirect patient back to room. Patient continues to talk about in-service options. Went out to hallway with patient and redirected to room.

## 2011-05-20 DIAGNOSIS — F329 Major depressive disorder, single episode, unspecified: Secondary | ICD-10-CM

## 2011-05-20 NOTE — BH Assessment (Addendum)
Spoke to [employer] crises worker regarding patients disposition. Informed him that patient will be discharged, per Dr. Ronney Asters recommendations. Bruce sts, "I think that this is a bad idea but hey...he is the doctor" and "We spent a lot of time setting up a plan for her...all for nothing". Furthermore, Clinical research associate informed Bruce that the psychiatrist recommended a follow up plan to be put into place. Bruce stated, "She will not follow up and I am not coming out their for that", "yall can give her some referrals to [facility] or something".  Writer will provide patient with the appropriate details and referrals for follow up as Dr. Oneta Rack requested in his consult.

## 2011-05-20 NOTE — ED Notes (Signed)
Pt. Requested Ativan, medication brought to pt.'s room, pt. Then refused to take medication, medication wasted with 2nd RN.

## 2011-05-20 NOTE — ED Notes (Signed)
VS withheld at this time due to pt finally being asleep, having had difficulty falling asleep and refusing sleep meds.

## 2011-05-20 NOTE — Consult Note (Signed)
Reason for Consult: Depression and Multiple sclerosis Referring Physician: Ranae Palms, MD  Anne Black is an 51 y.o. female.  HPI: patient was seen for the psychiatric evaluation and the possible treatment recommendations. Patient has been brought in by mobile crisis secondary to multiple phone calls to Hotline requesting help over past  Few weeks. Patient reported that she is looking for help with her 52  years old  twins daughters who has been oppositional,  defiant and not following her directions. Patient reported she also need help to have a parental skills and control on her children. Patient stated the she calls every day and not able to get the help she needed because they're not meeting the criteria  for  services  or  Services not available. Patient was told about hotline number  and call, so she'll keep  calling for services . Patient denies   current the symptoms of for suicidal ideation,intentions or plans. She has no homicidal ideations,  intentions or plans. Patient has been taking multiple medications for the multiple sclerosis from  [facility] family practice Dr. freed and also the neurologist from the [facility]. Reportedly patient   feels she need to be energized with the medications,  otherwise she feels more down,  lack of energy and low self-esteem. Patient  has increased energy and unable to focus on specific treatment plans for herself and her children. Patient has no previous history of psychiatric hospitalizations.  patient has no history of substance abuse. Patient is a more frequently coming to the nursing station and aks  the staff members to get help to be released so that she can care for her family.  patient twin daughters were staying with her mother while she was in the hospital for the last few days. Patient has no agitation aggressive behavior or required restraints.   Past Medical History  Diagnosis Date  . Multiple sclerosis   . Arthritis   . Anxiety     Past  Surgical History  Procedure Date  . Cesarean section     No family history on file.  Social History:  reports that she has been smoking.  She does not have any smokeless tobacco history on file. She reports that she drinks alcohol. She reports that she does not use illicit drugs.  Allergies: No Known Allergies  Medications: I have reviewed the patient's current medications.  No results found for this or any previous visit (from the past 48 hour(s)).  No results found.  Positive for anxiety, bad mood and repetitive activity Blood pressure 121/85, pulse 75, temperature 97.7 F (36.5 C), temperature source Oral, resp. rate 16, height 5\' 3"  (1.6 m), weight 115 lb (52.164 kg), SpO2 100.00%.   Assessment/Plan: Mood disorder NOS Multiple sclerosis  Patient has repeatedly endorsing no suicidal or homicidal ideations or psychotic symptoms.Patient does not meet criteria for acute psychiatric hospitalization at this. Case will be discussed with the treatment team regarding possible discharged to home and attending the outpatient psychiatric services for the children and herself at [facility] [place], and and [facility] or [employer] [facility].  Jadien Lehigh,JANARDHAHA R. 05/20/2011, 3:44 PM

## 2011-05-20 NOTE — [facility] Counselor (Signed)
Patient to be discharged home per Dr. Oneta Rack. Writer informed EDP-Dr. Jeraldine Loots of patients disposition. Patient was brought in by mobile crises worker and they have been seeking placement for this patient. Writer contacted mobile crises worker-Lisa and made her aware of patients disposition. She sts that a mobile crises worker will be to the hospital to follow up with patients follow up disposition.

## 2011-05-20 NOTE — ED Provider Notes (Signed)
Per Dr. Massie Maroon note, the patient is appropriate for d/c.  She will be provided appropriate referral information by the [facility] team.  Gerhard Munch, MD 05/20/11 [phone]

## 2011-05-20 NOTE — ED Notes (Signed)
Per EDP, 0700 medication order for Vyvanse held.

## 2011-05-20 NOTE — Discharge Instructions (Signed)
It is very important that you follow up as directed.  If you develop any new, or concerning changes in your condition, particularly any thoughts of hurting himself, hurting others, please make sure to return to the emergency department immediately.

## 2011-05-20 NOTE — ED Notes (Signed)
Pt's community ACT team representative was at bedside for 1 hr. Speaking with pt.  Pt. Continues to ask why she can't go home.  Per community ACT: r/t pt.'s behavior/home condition when they came to her home.  Contacted Consulting civil engineer for info on pt.'s advocate for [employer].  Contacted LaVey with [employer], pt. On phone speaking with [facility] about her admission.  LaVey may aske [employer] to see pt. In [facility] today on her behalf.

## 2011-05-20 NOTE — ED Notes (Signed)
Pt. Requested 1mg  of Ativan, not 2mg .

## 2011-07-01 ENCOUNTER — Emergency Department (HOSPITAL_COMMUNITY): Payer: BC Managed Care – PPO

## 2011-07-01 ENCOUNTER — Inpatient Hospital Stay (HOSPITAL_COMMUNITY): Payer: BC Managed Care – PPO

## 2011-07-01 ENCOUNTER — Encounter (HOSPITAL_COMMUNITY): Payer: Self-pay

## 2011-07-01 ENCOUNTER — Inpatient Hospital Stay (HOSPITAL_COMMUNITY)
Admission: EM | Admit: 2011-07-01 | Discharge: 2011-07-06 | DRG: 296 | Disposition: A | Discharge status: Other Acute Inpatient Hospital | Payer: BC Managed Care – PPO | Attending: Internal Medicine | Admitting: Internal Medicine

## 2011-07-01 DIAGNOSIS — F39 Unspecified mood [affective] disorder: Secondary | ICD-10-CM | POA: Diagnosis present

## 2011-07-01 DIAGNOSIS — E871 Hypo-osmolality and hyponatremia: Secondary | ICD-10-CM

## 2011-07-01 DIAGNOSIS — F1999 Other psychoactive substance use, unspecified with unspecified psychoactive substance-induced disorder: Secondary | ICD-10-CM

## 2011-07-01 DIAGNOSIS — F411 Generalized anxiety disorder: Secondary | ICD-10-CM | POA: Diagnosis present

## 2011-07-01 DIAGNOSIS — F308 Other manic episodes: Secondary | ICD-10-CM | POA: Diagnosis present

## 2011-07-01 DIAGNOSIS — G35 Multiple sclerosis: Secondary | ICD-10-CM | POA: Diagnosis present

## 2011-07-01 DIAGNOSIS — E876 Hypokalemia: Secondary | ICD-10-CM | POA: Diagnosis present

## 2011-07-01 DIAGNOSIS — E878 Other disorders of electrolyte and fluid balance, not elsewhere classified: Secondary | ICD-10-CM

## 2011-07-01 DIAGNOSIS — R4182 Altered mental status, unspecified: Secondary | ICD-10-CM

## 2011-07-01 DIAGNOSIS — F17200 Nicotine dependence, unspecified, uncomplicated: Secondary | ICD-10-CM | POA: Diagnosis present

## 2011-07-01 LAB — DIFFERENTIAL
Basophils Absolute: 0 10*3/uL (ref 0.0–0.1)
Eosinophils Relative: 0 % (ref 0–5)
Lymphocytes Relative: 18 % (ref 12–46)
Lymphs Abs: 1.5 10*3/uL (ref 0.7–4.0)
Monocytes Relative: 10 % (ref 3–12)
Neutro Abs: 5.9 10*3/uL (ref 1.7–7.7)

## 2011-07-01 LAB — COMPREHENSIVE METABOLIC PANEL
ALT: 25 U/L (ref 0–35)
Albumin: 3.9 g/dL (ref 3.5–5.2)
Alkaline Phosphatase: 73 U/L (ref 39–117)
BUN: 6 mg/dL (ref 6–23)
Chloride: 74 mEq/L — ABNORMAL LOW (ref 96–112)
Glucose, Bld: 114 mg/dL — ABNORMAL HIGH (ref 70–99)
Potassium: 2.2 mEq/L — CL (ref 3.5–5.1)
Sodium: 113 mEq/L — CL (ref 135–145)
Total Bilirubin: 1.1 mg/dL (ref 0.3–1.2)
Total Protein: 6.5 g/dL (ref 6.0–8.3)

## 2011-07-01 LAB — BASIC METABOLIC PANEL
GFR calc Af Amer: >90 mL/min (ref >90)
GFR calc non Af Amer: >90 mL/min (ref >90)
Glucose, Bld: 100 mg/dL — ABNORMAL HIGH (ref 70–99)
Potassium: 2.2 mEq/L — CL (ref 3.5–5.1)
Sodium: 129 mEq/L — ABNORMAL LOW (ref 135–145)

## 2011-07-01 LAB — CBC
HCT: 36.7 % (ref 36.0–46.0)
Hemoglobin: 13.8 g/dL (ref 12.0–15.0)
MCV: 84.6 fL (ref 78.0–100.0)
RBC: 4.34 MIL/uL (ref 3.87–5.11)
WBC: 8.2 10*3/uL (ref 4.0–10.5)

## 2011-07-01 LAB — URINALYSIS, ROUTINE W REFLEX MICROSCOPIC
Leukocytes, UA: NEGATIVE
Nitrite: NEGATIVE
Protein, ur: NEGATIVE mg/dL
Specific Gravity, Urine: 1.011 (ref 1.005–1.030)
Urobilinogen, UA: 0.2 mg/dL (ref 0.0–1.0)

## 2011-07-01 LAB — ETHANOL: Alcohol, Ethyl (B): <11 mg/dL (ref 0–11)

## 2011-07-01 LAB — SALICYLATE LEVEL: Salicylate Lvl: <2 mg/dL — ABNORMAL LOW (ref 2.8–20.0)

## 2011-07-01 LAB — MRSA PCR SCREENING: MRSA by PCR: POSITIVE — AB

## 2011-07-01 LAB — RAPID URINE DRUG SCREEN, HOSP PERFORMED
Amphetamines: NOT DETECTED
Opiates: NOT DETECTED
Tetrahydrocannabinol: NOT DETECTED

## 2011-07-01 LAB — OSMOLALITY, URINE: Osmolality, Ur: 78 mOsm/kg — ABNORMAL LOW (ref 390–1090)

## 2011-07-01 MED ORDER — POTASSIUM CHLORIDE 10 MEQ/100ML IV SOLN
10.0000 meq | INTRAVENOUS | Status: DC
  Administered 2011-07-01: 10 meq via INTRAVENOUS
  Filled 2011-07-01 (×6): qty 100

## 2011-07-01 MED ORDER — SODIUM CHLORIDE 0.9 % IV SOLN
INTRAVENOUS | Status: DC
  Administered 2011-07-01 (×2): via INTRAVENOUS

## 2011-07-01 MED ORDER — POTASSIUM CHLORIDE CRYS ER 20 MEQ PO TBCR
40.0000 meq | EXTENDED_RELEASE_TABLET | Freq: Two times a day (BID) | ORAL | Status: DC
  Administered 2011-07-01 – 2011-07-06 (×10): 40 meq via ORAL
  Filled 2011-07-01 (×12): qty 2

## 2011-07-01 MED ORDER — SODIUM CHLORIDE 0.9 % IV SOLN
Freq: Once | INTRAVENOUS | Status: AC
  Administered 2011-07-01: 06:00:00 via INTRAVENOUS

## 2011-07-01 MED ORDER — ACETAMINOPHEN 325 MG PO TABS
650.0000 mg | ORAL_TABLET | Freq: Four times a day (QID) | ORAL | Status: DC | PRN
  Administered 2011-07-01 – 2011-07-06 (×8): 650 mg via ORAL
  Filled 2011-07-01 (×9): qty 2

## 2011-07-01 MED ORDER — ONDANSETRON HCL 4 MG/2ML IJ SOLN
4.0000 mg | Freq: Once | INTRAMUSCULAR | Status: AC
  Administered 2011-07-01: 4 mg via INTRAVENOUS
  Filled 2011-07-01: qty 2

## 2011-07-01 MED ORDER — ONDANSETRON HCL 4 MG/2ML IJ SOLN: 4.0000 mg | Freq: Four times a day (QID) | INTRAMUSCULAR | Status: DC | PRN

## 2011-07-01 MED ORDER — PANTOPRAZOLE SODIUM 40 MG PO TBEC
40.0000 mg | DELAYED_RELEASE_TABLET | Freq: Every day | ORAL | Status: DC
  Administered 2011-07-01 – 2011-07-05 (×4): 40 mg via ORAL
  Filled 2011-07-01 (×6): qty 1

## 2011-07-01 MED ORDER — MUPIROCIN 2 % EX OINT
1.0000 "application " | TOPICAL_OINTMENT | Freq: Two times a day (BID) | CUTANEOUS | Status: AC
  Administered 2011-07-01 – 2011-07-05 (×9): 1 via NASAL
  Filled 2011-07-01 (×2): qty 22

## 2011-07-01 MED ORDER — POTASSIUM CHLORIDE 10 MEQ/100ML IV SOLN
10.0000 meq | Freq: Once | INTRAVENOUS | Status: AC
  Administered 2011-07-01: 10 meq via INTRAVENOUS
  Filled 2011-07-01: qty 100

## 2011-07-01 MED ORDER — ONDANSETRON HCL 4 MG PO TABS: 4.0000 mg | ORAL_TABLET | Freq: Four times a day (QID) | ORAL | Status: DC | PRN

## 2011-07-01 MED ORDER — ENOXAPARIN SODIUM 40 MG/0.4ML ~~LOC~~ SOLN
40.0000 mg | SUBCUTANEOUS | Status: DC
  Administered 2011-07-01 – 2011-07-05 (×4): 40 mg via SUBCUTANEOUS
  Filled 2011-07-01 (×6): qty 0.4

## 2011-07-01 MED ORDER — CHLORHEXIDINE GLUCONATE CLOTH 2 % EX PADS
6.0000 | MEDICATED_PAD | Freq: Every day | CUTANEOUS | Status: DC
  Administered 2011-07-02: 6 via TOPICAL

## 2011-07-01 MED ORDER — SODIUM CHLORIDE 0.9 % IV SOLN: INTRAVENOUS | Status: AC

## 2011-07-01 MED ORDER — ACETAMINOPHEN 650 MG RE SUPP: 650.0000 mg | Freq: Four times a day (QID) | RECTAL | Status: DC | PRN

## 2011-07-01 MED ORDER — SODIUM CHLORIDE 0.9 % IV SOLN
INTRAVENOUS | Status: DC
  Administered 2011-07-01 – 2011-07-02 (×3): via INTRAVENOUS
  Filled 2011-07-01 (×3): qty 1000

## 2011-07-01 NOTE — ED Notes (Signed)
Pt called ems because she has been vomiting for two days, pt lives in a huge house but theres trash everywhere and she has lots of cats, pt is also dirty and dishelved

## 2011-07-01 NOTE — ED Notes (Signed)
Pt stated she is seeing a Neurologist and receiving Acthar gel for MS.

## 2011-07-01 NOTE — ED Provider Notes (Signed)
Medical screening examination/treatment/procedure(s) were conducted as a shared visit with non-physician practitioner(s) and myself.  I personally evaluated the patient during the encounter. Patient initially unable to provide any reliable history. Does have psych history and given presentation concern for possible overdose. Patient relates only that she's been sick for 2 days and vomiting. Clinically dehydrated severe hyponatremia. IV fluids provided. No seizure activity. Plan medicine admission.  Sunnie Nielsen, MD 07/01/11 [phone]

## 2011-07-01 NOTE — H&P (Signed)
PCP: Dr.Fried  Chief Complaint:  Multiple vague complaints  HPI: [name] is a 50-year-old female with history of multiple sclerosis, anxiety and depression, patient history is extremely vague and this point, she reports that she called EMS because she was having nausea, feeling unwell and apprehensive. When EMS arrived they found that her house was disheveled, dirty with a lot of cats and brought her to the emergency room for evaluation, where she was noted to have a sodium of 113, and a potassium of 2.2. She reports recalling today that her mind was not quite right, but denies any drug overdose, trauma, fevers or chills  She thinks she may have vomited couple of times the last 2 days, denies any diarrhea, she reports recently starting Acthar gel shots for her multiple sclerosis, and may have taken a dose yesterday.  Allergies:  No Known Allergies    Past Medical History  Diagnosis Date  . Multiple sclerosis   . Arthritis   . Anxiety     Past Surgical History  Procedure Date  . Cesarean section     Prior to Admission medications   Medication Sig Start Date End Date Taking? Authorizing Provider  lisdexamfetamine (VYVANSE) 50 MG capsule Take 50 mg by mouth every morning.   Yes Historical Provider, MD  omeprazole (PRILOSEC) 20 MG capsule Take 20 mg by mouth daily.   Yes Historical Provider, MD  meloxicam (MOBIC) 15 MG tablet Take 15 mg by mouth daily.    Historical Provider, MD  modafinil (PROVIGIL) 200 MG tablet Take 200-400 mg by mouth daily as needed.     Historical Provider, MD    Social History: Single lives at home with her 2 twin daughters, smokes half a pack per day for about 20 years, drinks alcohol socially   History reviewed. No pertinent family history.  Review of Systems:  Constitutional: Denies fever, chills, diaphoresis, appetite change and fatigue.  HEENT: Denies photophobia, eye pain, redness, hearing loss, ear pain, congestion, sore throat, rhinorrhea,  sneezing, mouth sores, trouble swallowing, neck pain, neck stiffness and tinnitus.   Respiratory: Denies SOB, DOE, cough, chest tightness,  and wheezing.   Cardiovascular: Denies chest pain, palpitations and leg swelling.  Gastrointestinal: Denies nausea, vomiting, abdominal pain, diarrhea, constipation, blood in stool and abdominal distention.  Genitourinary: Denies dysuria, urgency, frequency, hematuria, flank pain and difficulty urinating.  Musculoskeletal: Denies myalgias, back pain, joint swelling, arthralgias and gait problem.  Skin: Denies pallor, rash and wound.  Neurological: Denies dizziness, seizures, syncope, weakness, light-headedness, numbness and headaches.  Hematological: Denies adenopathy. Easy bruising, personal or family bleeding history  Psychiatric/Behavioral: Denies suicidal ideation, mood changes, confusion, nervousness, sleep disturbance and agitation   Physical Exam: Blood pressure 133/59, pulse 72, temperature 98.4 F (36.9 C), temperature source Oral, resp. rate 20, height 5' 3" (1.6 m), weight 51.8 kg (114 lb 3.2 oz), SpO2 100.00%. GEn:AAOx3, no distress,  HEENT: PERRLA, EOMI, oral mucosa dry, neck no JVD CVS: S1S2/RRR, no m/r/g Lungs: CTAB Abd: soft, NT, BS present, Ext: no edema/clubbing Neuro: moves all ext, no localizing signs  Labs on Admission:  Results for orders placed during the hospital encounter of 07/01/11 (from the past 48 hour(s))  CBC     Status: Abnormal   Collection Time   07/01/11  5:31 AM      Component Value Range Comment   WBC 8.2  4.0 - 10.5 (K/uL)    RBC 4.34  3.87 - 5.11 (MIL/uL)    Hemoglobin 13.8  12.0 -   15.0 (g/dL)    HCT 36.7  36.0 - 46.0 (%)    MCV 84.6  78.0 - 100.0 (fL)    MCH 31.8  26.0 - 34.0 (pg)    MCHC 37.6 (*) 30.0 - 36.0 (g/dL) RULED OUT INTERFERING SUBSTANCES   RDW 11.6  11.5 - 15.5 (%)    Platelets 266  150 - 400 (K/uL)   DIFFERENTIAL     Status: Normal   Collection Time   07/01/11  5:31 AM      Component Value  Range Comment   Neutrophils Relative 72  43 - 77 (%)    Lymphocytes Relative 18  12 - 46 (%)    Monocytes Relative 10  3 - 12 (%)    Eosinophils Relative 0  0 - 5 (%)    Basophils Relative 0  0 - 1 (%)    Neutro Abs 5.9  1.7 - 7.7 (K/uL)    Lymphs Abs 1.5  0.7 - 4.0 (K/uL)    Monocytes Absolute 0.8  0.1 - 1.0 (K/uL)    Eosinophils Absolute 0.0  0.0 - 0.7 (K/uL)    Basophils Absolute 0.0  0.0 - 0.1 (K/uL)    RBC Morphology SPHEROCYTES      Smear Review LARGE PLATELETS PRESENT   GIANT PLATELETS SEEN  COMPREHENSIVE METABOLIC PANEL     Status: Abnormal   Collection Time   07/01/11  5:31 AM      Component Value Range Comment   Sodium 113 (*) 135 - 145 (mEq/L)    Potassium 2.2 (*) 3.5 - 5.1 (mEq/L)    Chloride 74 (*) 96 - 112 (mEq/L)    CO2 27  19 - 32 (mEq/L)    Glucose, Bld 114 (*) 70 - 99 (mg/dL)    BUN 6  6 - 23 (mg/dL)    Creatinine, Ser 0.35 (*) 0.50 - 1.10 (mg/dL)    Calcium 8.5  8.4 - 10.5 (mg/dL)    Total Protein 6.5  6.0 - 8.3 (g/dL)    Albumin 3.9  3.5 - 5.2 (g/dL)    AST 47 (*) 0 - 37 (U/L)    ALT 25  0 - 35 (U/L)    Alkaline Phosphatase 73  39 - 117 (U/L)    Total Bilirubin 1.1  0.3 - 1.2 (mg/dL)    GFR calc non Af Amer >90  >90 (mL/min)    GFR calc Af Amer >90  >90 (mL/min)   ETHANOL     Status: Normal   Collection Time   07/01/11  5:31 AM      Component Value Range Comment   Alcohol, Ethyl (B) <11  0 - 11 (mg/dL)   SALICYLATE LEVEL     Status: Abnormal   Collection Time   07/01/11  5:35 AM      Component Value Range Comment   Salicylate Lvl <2.0 (*) 2.8 - 20.0 (mg/dL)   ACETAMINOPHEN LEVEL     Status: Normal   Collection Time   07/01/11  5:35 AM      Component Value Range Comment   Acetaminophen (Tylenol), Serum <15.0  10 - 30 (ug/mL)   URINE RAPID DRUG SCREEN (HOSP PERFORMED)     Status: Normal   Collection Time   07/01/11  6:20 AM      Component Value Range Comment   Opiates NONE DETECTED  NONE DETECTED     Cocaine NONE DETECTED  NONE DETECTED      Benzodiazepines NONE DETECTED  NONE DETECTED       Amphetamines NONE DETECTED  NONE DETECTED     Tetrahydrocannabinol NONE DETECTED  NONE DETECTED     Barbiturates NONE DETECTED  NONE DETECTED    URINALYSIS, ROUTINE W REFLEX MICROSCOPIC     Status: Normal   Collection Time   07/01/11  6:20 AM      Component Value Range Comment   Color, Urine YELLOW  YELLOW     APPearance CLEAR  CLEAR     Specific Gravity, Urine 1.011  1.005 - 1.030     pH 7.5  5.0 - 8.0     Glucose, UA NEGATIVE  NEGATIVE (mg/dL)    Hgb urine dipstick NEGATIVE  NEGATIVE     Bilirubin Urine NEGATIVE  NEGATIVE     Ketones, ur NEGATIVE  NEGATIVE (mg/dL)    Protein, ur NEGATIVE  NEGATIVE (mg/dL)    Urobilinogen, UA 0.2  0.0 - 1.0 (mg/dL)    Nitrite NEGATIVE  NEGATIVE     Leukocytes, UA NEGATIVE  NEGATIVE  MICROSCOPIC NOT DONE ON URINES WITH NEGATIVE PROTEIN, BLOOD, LEUKOCYTES, NITRITE, OR GLUCOSE <1000 mg/dL.    Radiological Exams on Admission: No results found.  Assessment/Plan 1. Severe hyponatremia: Clinically appears dry, more awake now, has been getting a bolus of normal saline in the ER, Will continue Fluid challenge Will check stat B. Met check urine sodium and osmolarity Since mentation appears to have improved, will hold off on using hypertonic saline unless clinical course changes. 2. history of MS: Hold Acthar gel 3. 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 4. Hypokalemia: replace 5. DVT prophylaxis: lovenox  Time Spent on Admission: 45min Anne Black [facility] Pager: [phone] 07/01/2011, 11:22 AM    

## 2011-07-01 NOTE — ED Notes (Signed)
Report given-transfer to 1238

## 2011-07-01 NOTE — Consult Note (Signed)
Patient Identification:  Anne Black Date of Evaluation:  07/01/2011 Reason for Consult:  Pt with MS and evaluate Mental Status/psychotic sx  Referring Provider: Dr. Jomarie Longs History of Present Illness: Pt came with N/V and was found to be hyponatrimia, hypokalemia  Past Psychiatric History:Pt has been found disheveled, found in home with squalor, many cats, filth.   Past Medical History:     Past Medical History  Diagnosis Date  . Multiple sclerosis   . Arthritis   . Anxiety        Past Surgical History  Procedure Date  . Cesarean section     Allergies: No Known Allergies  Current Medications:  Prior to Admission medications   Medication Sig Start Date End Date Taking? Authorizing Provider  lisdexamfetamine (VYVANSE) 50 MG capsule Take 50 mg by mouth every morning.   Yes Historical Provider, MD  omeprazole (PRILOSEC) 20 MG capsule Take 20 mg by mouth daily.   Yes Historical Provider, MD  meloxicam (MOBIC) 15 MG tablet Take 15 mg by mouth daily.    Historical Provider, MD  modafinil (PROVIGIL) 200 MG tablet Take 200-400 mg by mouth daily as needed.     Historical Provider, MD    Social History:    reports that she has been smoking.  She has never used smokeless tobacco. She reports that she drinks alcohol. She reports that she does not use illicit drugs.   Family History:    History reviewed. No pertinent family history.  Mental Status Examination/Evaluation: Objective:  Appearance: Bizarre, Guarded and c/o frontal headache  Psychomotor Activity:  Increased and turns side to side; connot complete sentences  Eye Contact::  Poor  Speech:  Blocked and incomplete comments  Volume:  Decreased  Mood:  Anxious  Affect:  insisting to leave  Thought Process:  Disorganized  Orientation:  Other:  distracted by headache; last night by nausea  Thought Content:  Delusions unrealistic understanding of medical consdition  Suicidal Thoughts:  No  Homicidal Thoughts:  No    Judgement:  Poor  Insight:  Lacking    DIAGNOSIS:   AXIS I   Anxiety, medication induced psychosis  AXIS II  Deferred  AXIS III See medical notes.  AXIS IV economic problems, other psychosocial or environmental problems, problems with access to health care services and problems with primary support group  AXIS V 61-70 mild symptoms     Assessment/Plan: Pt may have mental status changes due to new medication Acthar gel [steroid component] or due to hyponatremia.  She is unaware of the need to stabilize sodium.  She insists upon going home.  Presently at evaluation, she was complaining of frontal headache and requested medication.  She is unable to complete a sentence.  She would begin saying something then turns her head and stops speaking.  She says its difficult to speak with the headache she has.  She says nothing about her MS. Except she began started a new steroid type medication.   RECOMMENDATION:  1. Pt is most likely having psyhciatric sx, non-specific related to new medication.  2. Suggest referral to community psychiatrist for follow up when medically stable . 3. May consider IVC if pt insists on leaving before K or Na have corrected.  4. Will follow pt.  Vir Whetstine J. Ferol Luz, MD Psychiatrist 07/01/2011 4:30 PM

## 2011-07-01 NOTE — ED Notes (Signed)
Attempted multiple times to get pt to give a urine sample.  Every attempt the pt would state she wanted something for nausea or she wanted to take a long nap.

## 2011-07-01 NOTE — Progress Notes (Addendum)
Initial visit with pt in response to referral by nursing.   This chaplain believes he is familiar with pt from previous encounter in [facility] ED.    Pt was standing upon chaplain entering room, but sat in chair and remained throughout encoutner.  Pt was energetic and had pressured and rapid speech.  Pt exhibited some disorganization and tangentiality in thought - showed difficulty following conversation and continued returning to subjects of wishing to use phone, wishing to talk to hospital administrators,stress and needing to accomplish tasks (was not able to specify what these tasks are).    Pt continually expressed frustration around hospitalization and IVC - voiced mistrust of medical team.  Asked chaplain to "go to magistrate" and be a witness for her.  Pt did not seem to understand that chaplain was not able to do this.  Pt did not acknowledge / remember encounters with chaplain during prior visit, but during conversation spoke about having been to Emergency Department several times in last year.  She was unclear when these times were and reported that she did not know what she had been doing over the last few months.  Pt was vague re: family details and daughters.    If pt has previous notes on admission to [facility] ED at [facility], check for family information - especially re: children  Pt seemed to respond to chaplain's intentionally low energy and throughout encounter appeared to become increasingly relaxed.  Chaplain provided emotional support and worked with pt on identifying coping skills for stress and finding peace in the midst of her turmoil.    Will continue to follow     Belva Crome MDiv, Chaplain     07/01/11 1800  Clinical Encounter Type  Visited With Patient  Visit Type Initial;Psychological support;Spiritual support;Social support;Critical Care  Referral From Nurse  Consult/Referral To Chaplain;Nurse  Recommendations Follow up for support  during admission   Spiritual Encounters  Spiritual Needs Emotional;Other (Comment)  Stress Factors  Patient Stress Factors Family relationships;Other (Comment)

## 2011-07-01 NOTE — Progress Notes (Signed)
Call received from lab reporting + pcr mrsa screen. Anne Black

## 2011-07-01 NOTE — ED Provider Notes (Signed)
History     CSN: [id number]  Arrival date & time 07/01/11  0455   First MD Initiated Contact with Patient 07/01/11 [phone]      Chief Complaint  Patient presents with  . Emesis  . Medical Clearance    (Consider location/radiation/quality/duration/timing/severity/associated sxs/prior treatment) HPI  Patient who was evaluated in ER in April 2003 for medical clearance for anxiety and passive suicidal ideation surrounding a dispute with CPS regarding her twin daughters presents to emergency department by EMS with complaint of nausea and vomiting. A level V caveat applies due to patient's altered mental status and difficulty getting a history. EMS reports that they picked patient up in her home which is a large home [place]" however inside they state that there was a large amount of disorder, trash about the house, and filth. Patient keeps complaining of nausea however is altered. She is disheveled with poor hygiene.  Past Medical History  Diagnosis Date  . Multiple sclerosis   . Arthritis   . Anxiety     Past Surgical History  Procedure Date  . Cesarean section     History reviewed. No pertinent family history.  History  Substance Use Topics  . Smoking status: Current Everyday Smoker  . Smokeless tobacco: Not on file  . Alcohol Use: Yes     occasional    OB History    Grav Para Term Preterm Abortions TAB SAB Ect Mult Living                  Review of Systems  Unable to perform ROS   Allergies  Review of patient's allergies indicates no known allergies.  Home Medications   Current Outpatient Rx  Name Route Sig Dispense Refill  . LISDEXAMFETAMINE DIMESYLATE 50 MG PO CAPS Oral Take 50 mg by mouth every morning.    Marland Kitchen OMEPRAZOLE 20 MG PO CPDR Oral Take 20 mg by mouth daily.    . MELOXICAM 15 MG PO TABS Oral Take 15 mg by mouth daily.    Marland Kitchen MODAFINIL 200 MG PO TABS Oral Take 200-400 mg by mouth daily as needed.       BP 127/60  Pulse 75  Temp(Src)  98.4 F (36.9 C) (Oral)  Resp 20  SpO2 99%  Physical Exam  Vitals reviewed. Constitutional: She appears well-developed and well-nourished. No distress.       Disheveled with poor hygiene   HENT:  Head: Normocephalic and atraumatic.  Right Ear: External ear normal.  Left Ear: External ear normal.  Nose: Nose normal.  Mouth/Throat: No oropharyngeal exudate.  Eyes: Conjunctivae and EOM are normal. Pupils are equal, round, and reactive to light.  Neck: Normal range of motion. Neck supple.  Cardiovascular: Normal rate, regular rhythm and normal heart sounds.  Exam reveals no gallop and no friction rub.   No murmur heard. Pulmonary/Chest: Effort normal and breath sounds normal. No respiratory distress. She has no wheezes. She has no rales. She exhibits no tenderness.  Abdominal: Soft. She exhibits no distension and no mass. There is tenderness. There is no rebound and no guarding.       Mild TTP of entire abdomen. No rigidity or peritoneal signs.   Musculoskeletal: She exhibits no edema and no tenderness.  Lymphadenopathy:    She has no cervical adenopathy.  Neurological: She is alert. She has normal reflexes.       Patient is uncooperative to neurological exam due to AMS, she is alert but will not follow  commands stating "I'm so nauseous, give me water" but will not answer questions or follow commands.   Skin: Skin is warm and dry. No rash noted. She is not diaphoretic.  Psychiatric: She has a normal mood and affect.    ED Course  Procedures (including critical care time)  IV potassium to repleat NS at 118mL/hour to slowly correct hyponatremia  Assessment and plan discussed with Dr. Dierdre Highman  Close monitoring for seizures.    Date: 07/01/2011  Rate: 66  Rhythm: normal sinus rhythm  QRS Axis: normal  Intervals: QT prolonged  ST/T Wave abnormalities: nonspecific T wave changes  Conduction Disutrbances:none  Narrative Interpretation: non provocative   Old EKG Reviewed: none  available   Labs Reviewed  CBC - Abnormal; Notable for the following:    MCHC 37.6 (*) RULED OUT INTERFERING SUBSTANCES   All other components within normal limits  COMPREHENSIVE METABOLIC PANEL - Abnormal; Notable for the following:    Sodium 113 (*)    Potassium 2.2 (*)    Chloride 74 (*)    Glucose, Bld 114 (*)    Creatinine, Ser 0.35 (*)    AST 47 (*)    All other components within normal limits  SALICYLATE LEVEL - Abnormal; Notable for the following:    Salicylate Lvl <2.0 (*)    All other components within normal limits  DIFFERENTIAL  URINE RAPID DRUG SCREEN (HOSP PERFORMED)  URINALYSIS, ROUTINE W REFLEX MICROSCOPIC  ETHANOL  ACETAMINOPHEN LEVEL  URINE CULTURE  SODIUM, URINE, RANDOM  OSMOLALITY, URINE   No results found.   1. Altered mental status   2. Hyponatremia   3. Hypokalemia   4. Hypochloremia       MDM  Patient is to be admitted to [facility] but will require IVC paperwork while in ER for concern of OD of unknown substance, AMS and critical labs in the setting of patient now alert and stating that she is wanting to leave.         [place], [place] 07/01/11 [phone]

## 2011-07-01 NOTE — ED Notes (Signed)
Report received-airway intact-no s/s's of distress-refusing tests-noncompliant with treatment-poor historian

## 2011-07-01 NOTE — ED Notes (Signed)
Pt refused x-ray/CT-

## 2011-07-01 NOTE — Progress Notes (Signed)
CARE MANAGEMENT NOTE 07/01/2011  Patient:  Anne Black, Anne Black   Account Number:  [id number]  Date Initiated:  07/01/2011  Documentation initiated by:  Claudeen Leason  Subjective/Objective Assessment:   mulitple issues, psych versus withdrawal, electrolyte embalance versus tremors     Action/Plan:   [facility]   Anticipated DC Date:  07/04/2011   Anticipated DC Plan:  [facility]  In-house referral  Clinical Social Worker      DC Planning Services  NA      [facility] Choice  NA   Choice offered to / List presented to:  NA   DME arranged  NA      DME agency  NA     HH arranged  NA      HH agency  NA   Status of service:  In process, will continue to follow Medicare Important Message given?  NA - LOS <3 / Initial given by admissions (If response is "NO", the following Medicare IM given date fields will be blank) Date Medicare IM given:   Date Additional Medicare IM given:    Discharge Disposition:    Per UR Regulation:  Reviewed for med. necessity/level of care/duration of stay  If discussed at Long Length of Stay Meetings, dates discussed:    Comments:  [id number]/Destini Cambre Earlene Plater, RN, BSN, CCM No discharge needs present at time of this review at the sdu/icu level. Case Management [phone]

## 2011-07-01 NOTE — ED Notes (Signed)
Poison control notified(stephanie)-info given-will consult with toxicologist-suggests lithium level, hydrate, electrolyte replacement

## 2011-07-01 NOTE — Progress Notes (Signed)
PCP: Dr.Fried  Chief Complaint:  Multiple vague complaints  HPI: Anne Black is a 51 year old female with history of multiple sclerosis, anxiety and depression, patient history is extremely vague and this point, she reports that she called EMS because she was having nausea, feeling unwell and apprehensive. When EMS arrived they found that her house was disheveled, dirty with a lot of cats and brought her to the emergency room for evaluation, where she was noted to have a sodium of 113, and a potassium of 2.2. She reports recalling today that her mind was not quite right, but denies any drug overdose, trauma, fevers or chills  She thinks she may have vomited couple of times the last 2 days, denies any diarrhea, she reports recently starting Acthar gel shots for her multiple sclerosis, and may have taken a dose yesterday.  Allergies:  No Known Allergies    Past Medical History  Diagnosis Date  . Multiple sclerosis   . Arthritis   . Anxiety     Past Surgical History  Procedure Date  . Cesarean section     Prior to Admission medications   Medication Sig Start Date End Date Taking? Authorizing Provider  lisdexamfetamine (VYVANSE) 50 MG capsule Take 50 mg by mouth every morning.   Yes Historical Provider, MD  omeprazole (PRILOSEC) 20 MG capsule Take 20 mg by mouth daily.   Yes Historical Provider, MD  meloxicam (MOBIC) 15 MG tablet Take 15 mg by mouth daily.    Historical Provider, MD  modafinil (PROVIGIL) 200 MG tablet Take 200-400 mg by mouth daily as needed.     Historical Provider, MD    Social History: Single lives at home with her 2 twin daughters, smokes half a pack per day for about 20 years, drinks alcohol socially   History reviewed. No pertinent family history.  Review of Systems:  Constitutional: Denies fever, chills, diaphoresis, appetite change and fatigue.  HEENT: Denies photophobia, eye pain, redness, hearing loss, ear pain, congestion, sore throat, rhinorrhea,  sneezing, mouth sores, trouble swallowing, neck pain, neck stiffness and tinnitus.   Respiratory: Denies SOB, DOE, cough, chest tightness,  and wheezing.   Cardiovascular: Denies chest pain, palpitations and leg swelling.  Gastrointestinal: Denies nausea, vomiting, abdominal pain, diarrhea, constipation, blood in stool and abdominal distention.  Genitourinary: Denies dysuria, urgency, frequency, hematuria, flank pain and difficulty urinating.  Musculoskeletal: Denies myalgias, back pain, joint swelling, arthralgias and gait problem.  Skin: Denies pallor, rash and wound.  Neurological: Denies dizziness, seizures, syncope, weakness, light-headedness, numbness and headaches.  Hematological: Denies adenopathy. Easy bruising, personal or family bleeding history  Psychiatric/Behavioral: Denies suicidal ideation, mood changes, confusion, nervousness, sleep disturbance and agitation   Physical Exam: Blood pressure 133/59, pulse 72, temperature 98.4 F (36.9 C), temperature source Oral, resp. rate 20, height 5\' 3"  (1.6 m), weight 51.8 kg (114 lb 3.2 oz), SpO2 100.00%. WUJ:WJXB1, no distress,  HEENT: PERRLA, EOMI, oral mucosa dry, neck no JVD CVS: S1S2/RRR, no m/r/g Lungs: CTAB Abd: soft, NT, BS present, Ext: no edema/clubbing Neuro: moves all ext, no localizing signs  Labs on Admission:  Results for orders placed during the hospital encounter of 07/01/11 (from the past 48 hour(s))  CBC     Status: Abnormal   Collection Time   07/01/11  5:31 AM      Component Value Range Comment   WBC 8.2  4.0 - 10.5 (K/uL)    RBC 4.34  3.87 - 5.11 (MIL/uL)    Hemoglobin 13.8  12.0 -  15.0 (g/dL)    HCT 16.1  09.6 - 04.5 (%)    MCV 84.6  78.0 - 100.0 (fL)    MCH 31.8  26.0 - 34.0 (pg)    MCHC 37.6 (*) 30.0 - 36.0 (g/dL) RULED OUT INTERFERING SUBSTANCES   RDW 11.6  11.5 - 15.5 (%)    Platelets 266  150 - 400 (K/uL)   DIFFERENTIAL     Status: Normal   Collection Time   07/01/11  5:31 AM      Component Value  Range Comment   Neutrophils Relative 72  43 - 77 (%)    Lymphocytes Relative 18  12 - 46 (%)    Monocytes Relative 10  3 - 12 (%)    Eosinophils Relative 0  0 - 5 (%)    Basophils Relative 0  0 - 1 (%)    Neutro Abs 5.9  1.7 - 7.7 (K/uL)    Lymphs Abs 1.5  0.7 - 4.0 (K/uL)    Monocytes Absolute 0.8  0.1 - 1.0 (K/uL)    Eosinophils Absolute 0.0  0.0 - 0.7 (K/uL)    Basophils Absolute 0.0  0.0 - 0.1 (K/uL)    RBC Morphology SPHEROCYTES      Smear Review LARGE PLATELETS PRESENT   GIANT PLATELETS SEEN  COMPREHENSIVE METABOLIC PANEL     Status: Abnormal   Collection Time   07/01/11  5:31 AM      Component Value Range Comment   Sodium 113 (*) 135 - 145 (mEq/L)    Potassium 2.2 (*) 3.5 - 5.1 (mEq/L)    Chloride 74 (*) 96 - 112 (mEq/L)    CO2 27  19 - 32 (mEq/L)    Glucose, Bld 114 (*) 70 - 99 (mg/dL)    BUN 6  6 - 23 (mg/dL)    Creatinine, Ser 4.09 (*) 0.50 - 1.10 (mg/dL)    Calcium 8.5  8.4 - 10.5 (mg/dL)    Total Protein 6.5  6.0 - 8.3 (g/dL)    Albumin 3.9  3.5 - 5.2 (g/dL)    AST 47 (*) 0 - 37 (U/L)    ALT 25  0 - 35 (U/L)    Alkaline Phosphatase 73  39 - 117 (U/L)    Total Bilirubin 1.1  0.3 - 1.2 (mg/dL)    GFR calc non Af Amer >90  >90 (mL/min)    GFR calc Af Amer >90  >90 (mL/min)   ETHANOL     Status: Normal   Collection Time   07/01/11  5:31 AM      Component Value Range Comment   Alcohol, Ethyl (B) <11  0 - 11 (mg/dL)   SALICYLATE LEVEL     Status: Abnormal   Collection Time   07/01/11  5:35 AM      Component Value Range Comment   Salicylate Lvl <2.0 (*) 2.8 - 20.0 (mg/dL)   ACETAMINOPHEN LEVEL     Status: Normal   Collection Time   07/01/11  5:35 AM      Component Value Range Comment   Acetaminophen (Tylenol), Serum <15.0  10 - 30 (ug/mL)   URINE RAPID DRUG SCREEN (HOSP PERFORMED)     Status: Normal   Collection Time   07/01/11  6:20 AM      Component Value Range Comment   Opiates NONE DETECTED  NONE DETECTED     Cocaine NONE DETECTED  NONE DETECTED      Benzodiazepines NONE DETECTED  NONE DETECTED  Amphetamines NONE DETECTED  NONE DETECTED     Tetrahydrocannabinol NONE DETECTED  NONE DETECTED     Barbiturates NONE DETECTED  NONE DETECTED    URINALYSIS, ROUTINE W REFLEX MICROSCOPIC     Status: Normal   Collection Time   07/01/11  6:20 AM      Component Value Range Comment   Color, Urine YELLOW  YELLOW     APPearance CLEAR  CLEAR     Specific Gravity, Urine 1.011  1.005 - 1.030     pH 7.5  5.0 - 8.0     Glucose, UA NEGATIVE  NEGATIVE (mg/dL)    Hgb urine dipstick NEGATIVE  NEGATIVE     Bilirubin Urine NEGATIVE  NEGATIVE     Ketones, ur NEGATIVE  NEGATIVE (mg/dL)    Protein, ur NEGATIVE  NEGATIVE (mg/dL)    Urobilinogen, UA 0.2  0.0 - 1.0 (mg/dL)    Nitrite NEGATIVE  NEGATIVE     Leukocytes, UA NEGATIVE  NEGATIVE  MICROSCOPIC NOT DONE ON URINES WITH NEGATIVE PROTEIN, BLOOD, LEUKOCYTES, NITRITE, OR GLUCOSE <1000 mg/dL.    Radiological Exams on Admission: No results found.  Assessment/Plan 1. Severe hyponatremia: Clinically appears dry, more awake now, has been getting a bolus of normal saline in the ER, Will continue Fluid challenge Will check stat B. Met check urine sodium and osmolarity Since mentation appears to have improved, will hold off on using hypertonic saline unless clinical course changes. 2. history of MS: Hold Acthar gel 3. 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 4. Hypokalemia: replace 5. DVT prophylaxis: lovenox  Time Spent on Admission: Khanh Cordner [facility] Pager: [phone] 07/01/2011, 11:22 AM

## 2011-07-01 NOTE — Progress Notes (Signed)
Pt found off monitor with IV disconnected and tubing running on floor and pt up standing on opposite side of room from where she had been sitting in recliner. I have repeatedly reminded pt to use call bell when she needs assistance, she does not acknowledge or agree to instructions.Pt tried to re-connect her IV tubing to her IV site but I stopped her and explained why she should not be manipulating her IV due to risk of infection.   Placed back in recliner. IV tubing changed . Anne Black

## 2011-07-01 NOTE — Progress Notes (Signed)
Several times today pt has been found up in room by self after being asked to call for assistance and with call bell in reach. Pt has constant  stream of conversation while I am  in room ; with flight of ideas. She has called the hospital operator multiple times(q 5  Mins)  with various  requests such as asking for administration or asking to let her out of here.  She states she  wants to leave here because it is a holiday week-end and she has plans with her kids. Clearence Ped AD asked to speak with her. Chaplain called to come see her. Frequent emotional support given. Pt is mistrusting and states we are just doing this for her  Money. Anthonette Legato

## 2011-07-01 NOTE — Progress Notes (Signed)
Call received from [facility] in [place] and  update given.Anne Black

## 2011-07-02 DIAGNOSIS — E876 Hypokalemia: Secondary | ICD-10-CM

## 2011-07-02 DIAGNOSIS — E871 Hypo-osmolality and hyponatremia: Secondary | ICD-10-CM

## 2011-07-02 DIAGNOSIS — G35 Multiple sclerosis: Secondary | ICD-10-CM

## 2011-07-02 DIAGNOSIS — R4182 Altered mental status, unspecified: Secondary | ICD-10-CM

## 2011-07-02 LAB — COMPREHENSIVE METABOLIC PANEL
ALT: 21 U/L (ref 0–35)
Alkaline Phosphatase: 57 U/L (ref 39–117)
CO2: 27 mEq/L (ref 19–32)
Chloride: 99 mEq/L (ref 96–112)
GFR calc Af Amer: >90 mL/min (ref >90)
GFR calc non Af Amer: >90 mL/min (ref >90)
Glucose, Bld: 97 mg/dL (ref 70–99)
Potassium: 3.2 mEq/L — ABNORMAL LOW (ref 3.5–5.1)
Sodium: 132 mEq/L — ABNORMAL LOW (ref 135–145)

## 2011-07-02 LAB — CBC
Hemoglobin: 12 g/dL (ref 12.0–15.0)
RBC: 3.78 MIL/uL — ABNORMAL LOW (ref 3.87–5.11)
WBC: 5.3 10*3/uL (ref 4.0–10.5)

## 2011-07-02 LAB — URINE CULTURE

## 2011-07-02 MED ORDER — LORAZEPAM 2 MG/ML IJ SOLN: 1.0000 mg | INTRAMUSCULAR | Status: DC | PRN

## 2011-07-02 MED ORDER — LORAZEPAM 2 MG/ML IJ SOLN
1.0000 mg | Freq: Once | INTRAMUSCULAR | Status: AC
  Administered 2011-07-02: 1 mg via INTRAVENOUS
  Filled 2011-07-02: qty 1

## 2011-07-02 MED ORDER — NICOTINE 14 MG/24HR TD PT24
14.0000 mg | MEDICATED_PATCH | Freq: Every day | TRANSDERMAL | Status: DC
  Administered 2011-07-02 – 2011-07-06 (×5): 14 mg via TRANSDERMAL
  Filled 2011-07-02 (×6): qty 1

## 2011-07-02 MED ORDER — HALOPERIDOL LACTATE 5 MG/ML IJ SOLN
INTRAMUSCULAR | Status: AC
  Administered 2011-07-02: 5 mg
  Filled 2011-07-02: qty 1

## 2011-07-02 MED ORDER — LORAZEPAM 2 MG/ML IJ SOLN
INTRAMUSCULAR | Status: AC
  Administered 2011-07-02: 2 mg
  Filled 2011-07-02: qty 1

## 2011-07-02 MED ORDER — HALOPERIDOL LACTATE 5 MG/ML IJ SOLN
5.0000 mg | Freq: Four times a day (QID) | INTRAMUSCULAR | Status: DC | PRN
  Administered 2011-07-03: 5 mg via INTRAMUSCULAR
  Filled 2011-07-02: qty 1

## 2011-07-02 NOTE — Progress Notes (Signed)
Clinical Social Work Department CLINICAL SOCIAL WORK PSYCHIATRY SERVICE LINE ASSESSMENT 07/02/2011  Patient:  Anne Black  Account:  [id number]  Admit Date:  07/01/2011  Clinical Social Worker:  Doroteo Glassman  Date/Time:  07/02/2011 01:30 PM Referred by:  Physician  Date referred:  07/02/2011 Reason for Referral  Behavioral Health Issues   Presenting Symptoms/Problems (In the person's/family's own words):   Pt is engaging in bizarre behaviors, with awakening in the middle of the night to do exercises.  She has rapid and pressured speech.  Per medical records, Pt has been IVC'd due to bizarre behaviors and was supposed to go to [facility].    Abuse/Neglect/Trauma Comments:   Psychiatric History (check all that apply)  Inpatient/hospitilization   Psychiatric medications:  Unk   Current Mental Health Hospitalizations/Previous Mental Health History:   Current provider:   Unk   Place and Date:   Current Medications:   Unk   Previous Impatient Admission/Date/Reason:   Emotional Health / Current Symptoms     Suicide attempt in the past:   Other harmful behavior:   Psychotic/Dissociative Symptoms  Delusional  Paranoia   Other Psychotic/Dissociative Symptoms:    Attention/Behavioral Symptoms  Inattentive   Other Attention / Behavioral Symptoms:    Cognitive Impairment  Poor Judgement  Poor/Impaired Decision-Making   Other Cognitive Impairment:    Mood and Adjustment  Anxious  Manic  Paranoia     Anxiety (frequency):   Phobia (specify):   Compulsive behavior (specify):   Obsessive behavior (specify):   Other:    SBIRT completed (please refer for detailed history):    Self-reported substance use:   Urinary Drug Screen Completed:  Y Alcohol level:    Environmental/Housing/Living Arrangement  Stable housing   Who is in the home:   Twin 18 year old daughters   Emergency contact:  Mom, [name].   Financial  Social Security Disability Income    Patient's Strengths and Goals (patient's own words):   Clinical Social Worker's Interpretive Summary:   See CSW's detailed note in EPIC   Disposition:  Inpatient referral made [facility], [facility], Geri-psych)  Providence Crosby, [place] Clinical Social Work [phone]

## 2011-07-02 NOTE — Progress Notes (Signed)
Pt has exhibited erratic impulsive behavior throughout shift, alternately resting then jumping out of bed "needing exercise", removing leads stating "I don't have a heart problem", questioning safety sitter at bedside. Emotional support provided, however pt very distrustful of staff and argumentative about medical treatment. [facility] notified, nicotine patch applied per pt request and ativan 1mg  IV given per orders. Continuing to monitor.

## 2011-07-02 NOTE — Progress Notes (Signed)
CSW reviewed Pt's chart.  Per psych MD, Pt appropriate for [facility].  Notified MD.  Sherron Monday with Bruce from [facility], mobile crisis unit for mental health patients.  Per Bruce, Pt is well-known to the psych ED due to bizarre and manic behaviors with psychotic features.  Bruce states that Pt has twin daughters, approx 52 years old, whom she claims has significant behavioral pxs.  Pt will take the children from the home in the middle of the night and go to the Magistrate's Office to petition that the state assume custody.  Bruce states that the girls will be left in the car for extended periods of time.  She will then take them to her mom's house (her mom wants to have the children) and will leave them with her for a day or 2.  She will then come and get the girls and take them home.  There are incidents where Pt will be with the children while they're swinging outside and she will abruptly go inside and call the police, stating that she cannot find her children and that they've run away.  Bruce reports that he and his supervisor and law enforcement have presented at the home due to Pt's erratic behaviors.  Bruce reports that the house was in extreme disarray and was extremely filthy and that they contacted CPS.  CPS is involved and is aware of the conditions of the home.  Per Bruce, CPS has not assumed custody due to lack of evidence that the Pt is providing an injurious environment.  Per Bruce, Pt was on IVC status recently while in the psych ED and [facility] had moved her to the top of their list due to acuity.  On the day of d/c, Pt spoke with a Patient Advocate and, with this person's help, was able to convince psych MD, Dr. Shela Commons, to allow her to d/c home.  Bruce stated repeatedly, "This is an awful situation.  She won't take medication or see a psychiatrist."  Bruce states that the ACT team is no longer involved, as Pt has refused services.  Notified psych MD.  Psych MD wanting Pt transferred to [facility].   Psych MD to amend her note.  Notified MD and RN.  Contacted Pt's mom, Teena Irani, 57 year old retired Charity fundraiser from [employer] after 34 years.  [name] stated that she's had Pt's 88 year old twin daughters since Tuesday and was not aware that Pt was in the hospital until yesterday.  Pt's mom stated that Pt has triplett daughters who are 53 years old.  They have been living with their father for many years due to Pt's erratic, bizarre behaviors.    Pt's 5 year old daughters are by a donor.  Father is unknown.  Per [name], the children are "lovely" and are excellent students.  She stated that, as an older person, her expectations for children's behaviors are probably stronger than younger parents and she states that they are sweet, loving, well-behaved girls.  Pt's mom reports that Pt graduated [employer] and was a Environmental consultant for many years.  Pt's mom stated that Pt's mental health declined in early adulthood and that Pt had 4 hospitalizations at [place], [facility] and [place] in a short period.  Pt's mom reports that Pt's mental health stabilized when she was married.  She stated, however, that it began to decline over the past couple of years.  [name] stated that Pt has had 2 involuntary commitments in the recent past.  The first was when Pt took  her children to [facility] ED and wanted an MRI done on her.  When the ED MD refused, Pt began to act bizarre and left.  Pt's mom stated that staff wouldn't define "bizarre" but that staff was so concerned for the children that they got her license plate and called the [facility].  The Sheriff located Pt and took Pt and the girls to the ED.  Pt was put under IVC status.  The girls sat at the ED until well into the night until the [facility] contacted [name] at 0130 and asked that she come and get the girls.  The case was referred to DSS and DSS asked that [name] keep the girls until the situation could be explored.  Pt was  released after 48hrs and took the children home.  [name] was put under IVC status a 2nd time after calling mobile crisis ([facility]) at 2230 and told them to come and get the girls because they were abusing her.  Bruce arrived, per [name], and called [name] to come and get the girls, as Pt was acting in a bizarre manner.  When [name] arrive, Pt became furious and told mom to leave.  Bruce then went to the [employer] and obtained and IVC.  Sheriff took the girls to [name] at 0130 that night and then took Pt to [facility] psych ED.  [name] stated that Pt calls the [facility] regularly and will demand that they come and ghas taken the children to the Magistrate's Office and will tell them that the girls are abusive.  Pt will take the girls to the Magistrate's Office and tell them that she's getting them help because they're mentally ill.  There was an occasion whereupon the girls slept in the car downtown for 3 hours.  The Sheriff found them at midnight and took them to [name]'s home.  [name] states that Pt has kept the girls out of school because they didn't clean the kitchen.  [name] has spoken to the school SW and the principal.  [name] states that DSS has been involved but she's not sure if they are currently working with the family.     This past Saturday, Pt arranged for the girls to go with their friends and friends' mother for the afternoon and provided the mother with some money for the kids for the day.  Pt later called the [facility] and said that the friends' mother took the girls without permission.o behavior pxs.  Sheriff on Saturday called them and said that friend and friend's mom took the girls without her permission.    Pt's mom states that Pt currently receives SSI.  CSW thanked Pt's mom for her time.  CSW met with Pt.  Pt had pressured speech, rapid thoughts and expressed confusion re: the reasons she must remain in the hospital.  CSW,  with RN Millie present, attempted to discuss with Pt the concerns re: her mental health.  Pt was adamant that she doesn't have mental health issues.  She states that she has pxs with her daughter's behaviors and that she has sought the assistance of the Magistrate on many occasions.  She admitted to leaving her children in the car while she spoke with the Magistrate but stated that she asked someone off the street to watch them. She explained that she wants treatment for them or to put them in juvenile detention.  Pt's thinking wasn't clear or coherent and she continued  to state that she needs to be empowered and that she wants to join some groups.   CSW explained to Pt that the d/c plan for her is [facility].  Pt not in agreement and wanting to d/c now.  CSW explained to Pt that she is under IVC status and that both MDs feel that inpt mental tx is warranted.  Spoke with Scharlene Corn at [facility].  Thom to review Pt.  Providence Crosby, LCSWA Clinical Social Work [phone]

## 2011-07-02 NOTE — Progress Notes (Signed)
Patient Identification:  Anne Black Date of Evaluation:  07/02/2011 Reason for Consult: MS   Psychiatric Sx  Referring Provider:  Dr. Jomarie Longs History of Present Illness:Pt came with N/V and was found to be hyponatrimia, hypokalemia  Past Psychiatric History:Pt has been found disheveled, found in home with squalor, many cats, filth. Addendum from Psych CSW:  Pt has had very erratic behavior from ACT team member:  She has frequented the [place] at all hours insisting that her children, twins,ages 11, be removed from the home; other version - be put into juvenile detention.   Past Medical History:     Past Medical History  Diagnosis Date  . Multiple sclerosis   . Arthritis   . Anxiety        Past Surgical History  Procedure Date  . Cesarean section     Allergies: No Known Allergies  Current Medications:  Prior to Admission medications   Medication Sig Start Date End Date Taking? Authorizing Provider  lisdexamfetamine (VYVANSE) 50 MG capsule Take 50 mg by mouth every morning.   Yes Historical Provider, MD  omeprazole (PRILOSEC) 20 MG capsule Take 20 mg by mouth daily.   Yes Historical Provider, MD  meloxicam (MOBIC) 15 MG tablet Take 15 mg by mouth daily.    Historical Provider, MD  modafinil (PROVIGIL) 200 MG tablet Take 200-400 mg by mouth daily as needed.     Historical Provider, MD    Social History:    reports that she has been smoking.  She has never used smokeless tobacco. She reports that she drinks alcohol. She reports that she does not use illicit drugs.   Family History:    History reviewed. No pertinent family history.  Mental Status Examination/Evaluation: Objective:  Appearance: Bizarre and Disheveled  Psychomotor Activity:  Restlessness and in and out of bed, standing sitting; 'exercizing'  Eye Contact::  Good  Speech:  clear and disjointed in thought and content; great pauses  Volume:  Normal  Mood:  Dysphoric  Affect:  Inappropriate  Thought  Process:  Disorganized  Orientation:  Other:  obsessing on going home  Thought Content:  Delusions unclear about complaints about daughters  Suicidal Thoughts:  No, vague, evasive  Homicidal Thoughts:  No  Judgement:  Poor  Insight:  Lacking    DIAGNOSIS:   AXIS I   Mania, r/o steroid induce, thought disorderedr  AXIS II  Deferred  AXIS III See medical notes.  AXIS IV economic problems, other psychosocial or environmental problems, problems related to social environment and problems with primary support group  AXIS V 61-70 mild symptoms     Assessment/Plan: Discussed with Dr. Jomarie Longs, RN, Psych CSW Pt stares without response to personal greeting.  She says she MUST get home because she has many things to take care of.  She deflects response when asked who she sees for therapy, suicidal thoughts, or why she thinks her twin daughters need to be in detention.  She is vague about every issue but very verbal without any detail explained.  She has facial gestures that vary as often as she moves about the room.  She says [facility] cannot help her with the important things she needs to manage at home. NB: EMS found the home filthy with many cats, unkempt rooms.  She is unable to describe her MS symptoms.  More information has been made available to [facility] Psych CSW.  Pt still cannot name her doctor who has given her the steroid pulse Rx.  Her sodium and potassium by last report is below normal limits  However this per report by Dr. Jomarie Longs is in the 'normal range' for this patient.  She also received potassium supplement this am.  RECOMMENDATION:  1.  Pt is not stable to care for self and/or children 2. Transfer to [facility] or comparable inpatient psychiatric unit as Dr. Jomarie Longs has declared her medically stable.  3.  Consider report to CPS for unsanitary household, 'neglect' and mental injury for numerous attempts to have children removed from her home.  She objects to her mother taking the twins.  4.  No further psychiatric needs identified  MD Psychiatrist signs off.  Alexandria Shiflett J. Ferol Luz, MD Psychiatrist  07/02/2011 1:38 PM

## 2011-07-02 NOTE — Progress Notes (Signed)
AC states patient is IVC and extreme flight risk and needs her own Recruitment consultant.  Notified Chasity Hearn,RN of this situation

## 2011-07-02 NOTE — Progress Notes (Signed)
Per Ala Dach at [facility], Pt's information being reviewed now.  CSW to continue to follow.  Providence Crosby, LCSWA Clinical Social Work [phone]

## 2011-07-03 DIAGNOSIS — G35D Multiple sclerosis, unspecified: Secondary | ICD-10-CM

## 2011-07-03 DIAGNOSIS — E871 Hypo-osmolality and hyponatremia: Secondary | ICD-10-CM

## 2011-07-03 DIAGNOSIS — F39 Unspecified mood [affective] disorder: Secondary | ICD-10-CM

## 2011-07-03 DIAGNOSIS — E876 Hypokalemia: Secondary | ICD-10-CM

## 2011-07-03 DIAGNOSIS — R4182 Altered mental status, unspecified: Secondary | ICD-10-CM

## 2011-07-03 DIAGNOSIS — F411 Generalized anxiety disorder: Secondary | ICD-10-CM

## 2011-07-03 DIAGNOSIS — F308 Other manic episodes: Secondary | ICD-10-CM

## 2011-07-03 DIAGNOSIS — Z8659 Personal history of other mental and behavioral disorders: Secondary | ICD-10-CM

## 2011-07-03 DIAGNOSIS — G35 Multiple sclerosis: Secondary | ICD-10-CM

## 2011-07-03 LAB — BASIC METABOLIC PANEL
BUN: 7 mg/dL (ref 6–23)
Calcium: 8.3 mg/dL — ABNORMAL LOW (ref 8.4–10.5)
Creatinine, Ser: 0.44 mg/dL — ABNORMAL LOW (ref 0.50–1.10)
GFR calc Af Amer: >90 mL/min (ref >90)

## 2011-07-03 MED ORDER — MODAFINIL 200 MG PO TABS
200.0000 mg | ORAL_TABLET | Freq: Every day | ORAL | Status: DC | PRN
  Administered 2011-07-03 – 2011-07-06 (×4): 200 mg via ORAL
  Filled 2011-07-03 (×2): qty 1
  Filled 2011-07-03: qty 2
  Filled 2011-07-03 (×2): qty 1

## 2011-07-03 MED ORDER — LISDEXAMFETAMINE DIMESYLATE 50 MG PO CAPS
50.0000 mg | ORAL_CAPSULE | Freq: Every day | ORAL | Status: DC
  Administered 2011-07-03 – 2011-07-06 (×4): 50 mg via ORAL
  Filled 2011-07-03 (×5): qty 1

## 2011-07-03 MED ORDER — ZOLPIDEM TARTRATE 5 MG PO TABS
5.0000 mg | ORAL_TABLET | Freq: Every evening | ORAL | Status: DC | PRN
  Administered 2011-07-04 – 2011-07-05 (×2): 5 mg via ORAL
  Filled 2011-07-03 (×2): qty 1

## 2011-07-03 MED ORDER — MELOXICAM 15 MG PO TABS
15.0000 mg | ORAL_TABLET | Freq: Every day | ORAL | Status: DC | PRN
  Administered 2011-07-03 – 2011-07-04 (×2): 15 mg via ORAL
  Filled 2011-07-03 (×3): qty 1

## 2011-07-03 NOTE — Progress Notes (Signed)
At shift change, pt was very agitated and threatening to leave. Pt was uncooperative and tried to leave the unit. [employer] and security was called to escort pt back to her room. It was explained by both the RN and the [facility] that she involuntarily committed and could not leave the hospital AMA. The pt progressively got more upset and angry. 2mg  Ativan administered via IM which was ineffective. 5mg  Haldol administered via IM was then administered. 15 minutes later, the pt is sitting in bed, calm. VS stable. Will continue to monitor.

## 2011-07-03 NOTE — Progress Notes (Signed)
Subjective: Was agitated and restless, security called last pm, currently sleeping from Haldol  Objective: Vital signs in last 24 hours: Temp:  [97.6 F (36.4 C)-98 F (36.7 C)] 98 F (36.7 C) (05/25 0549) Pulse Rate:  [59-71] 68  (05/25 0549) Resp:  [18-20] 18  (05/25 0549) BP: (95-130)/(48-79) 130/79 mmHg (05/25 0549) SpO2:  [100 %] 100 % (05/25 0549) Weight change:  Last BM Date: 07/02/11  Intake/Output from previous day: 05/24 0701 - 05/25 0700 In: 300 [I.V.:300] Out: -  Total I/O In: 960 [P.O.:960] Out: -    Physical Exam: General: sleeping,  in no acute distress. Exam: deferred   Lab Results: Basic Metabolic Panel:  Basename 07/03/11 0552 07/02/11 0335  NA 133* 132*  K 3.5 3.2*  CL 102 99  CO2 25 27  GLUCOSE 96 97  BUN 7 8  CREATININE 0.44* 0.47*  CALCIUM 8.3* 7.8*  MG -- --  PHOS -- --   Liver Function Tests:  Basename 07/02/11 0335 07/01/11 0531  AST 25 47*  ALT 21 25  ALKPHOS 57 73  BILITOT 0.2* 1.1  PROT 5.3* 6.5  ALBUMIN 3.1* 3.9   No results found for this basename: LIPASE:2,AMYLASE:2 in the last 72 hours No results found for this basename: AMMONIA:2 in the last 72 hours CBC:  Basename 07/02/11 0335 07/01/11 0531  WBC 5.3 8.2  NEUTROABS -- 5.9  HGB 12.0 13.8  HCT 33.3* 36.7  MCV 88.1 84.6  PLT 223 266   Cardiac Enzymes: No results found for this basename: CKTOTAL:3,CKMB:3,CKMBINDEX:3,TROPONINI:3 in the last 72 hours BNP: No results found for this basename: PROBNP:3 in the last 72 hours D-Dimer: No results found for this basename: DDIMER:2 in the last 72 hours CBG: No results found for this basename: GLUCAP:6 in the last 72 hours Hemoglobin A1C: No results found for this basename: HGBA1C in the last 72 hours Fasting Lipid Panel: No results found for this basename: CHOL,HDL,LDLCALC,TRIG,CHOLHDL,LDLDIRECT in the last 72 hours Thyroid Function Tests: No results found for this basename: TSH,T4TOTAL,FREET4,T3FREE,THYROIDAB in the  last 72 hours Anemia Panel: No results found for this basename: VITAMINB12,FOLATE,FERRITIN,TIBC,IRON,RETICCTPCT in the last 72 hours Coagulation: No results found for this basename: LABPROT:2,INR:2 in the last 72 hours Urine Drug Screen: Drugs of Abuse     Component Value Date/Time   LABOPIA NONE DETECTED 07/01/2011 0620   COCAINSCRNUR NONE DETECTED 07/01/2011 0620   LABBENZ NONE DETECTED 07/01/2011 0620   AMPHETMU NONE DETECTED 07/01/2011 0620   THCU NONE DETECTED 07/01/2011 0620   LABBARB NONE DETECTED 07/01/2011 0620    Alcohol Level:  Basename 07/01/11 0531  ETH <11   Urinalysis:  Basename 07/01/11 0620  COLORURINE YELLOW  LABSPEC 1.011  PHURINE 7.5  GLUCOSEU NEGATIVE  HGBUR NEGATIVE  BILIRUBINUR NEGATIVE  KETONESUR NEGATIVE  PROTEINUR NEGATIVE  UROBILINOGEN 0.2  NITRITE NEGATIVE  LEUKOCYTESUR NEGATIVE    Recent Results (from the past 240 hour(s))  URINE CULTURE     Status: Normal   Collection Time   07/01/11  6:20 AM      Component Value Range Status Comment   Specimen Description URINE, CLEAN CATCH   Final    Special Requests Immunocompromised   Final    Culture  Setup Time [id number]   Final    Colony Count 4,000 COLONIES/ML   Final    Culture INSIGNIFICANT GROWTH   Final    Report Status 07/02/2011 FINAL   Final   MRSA PCR SCREENING     Status: Abnormal   Collection  Time   07/01/11  9:50 AM      Component Value Range Status Comment   MRSA by PCR POSITIVE (*) NEGATIVE  Final     Studies/Results: No results found.  Medications: Scheduled Meds:    . Chlorhexidine Gluconate Cloth  6 each Topical Q0600  . enoxaparin  40 mg Subcutaneous Q24H  . haloperidol lactate      . LORazepam      . mupirocin ointment  1 application Nasal BID  . nicotine  14 mg Transdermal Daily  . pantoprazole  40 mg Oral Q1200  . potassium chloride  40 mEq Oral BID   Continuous Infusions:  PRN Meds:.acetaminophen, acetaminophen, haloperidol lactate, LORazepam, ondansetron  (ZOFRAN) IV, ondansetron, DISCONTD: LORazepam  Assessment/Plan: 1. hyponatremia:  Secondary to hypovolemia, improved with IVF Stopped fluids 2. history of MS:  Hold Acthar gel  Restart home meds 3. history of Bipolar d/o, psych disorder not otherwise specified  Requesting to be discharged home on multiple occasions, per Psych needs inpatient psych, [facility] vs alternate facility Medically stable for DC Await input from Psych CSW today 4. Hypokalemia: replace  5. DVT prophylaxis: lovenox Dispo: per CSW, medically stable for DC to Psych facility   LOS: 2 days   [facility] Pager: [phone] 07/03/2011, 12:08 PM

## 2011-07-03 NOTE — Progress Notes (Signed)
Subjective: Very erratic anxious to go home, just waking up, tangentiality of thought  Objective: Vital signs in last 24 hours: Temp:  [97.6 F (36.4 C)-98 F (36.7 C)] 98 F (36.7 C) (05/25 0549) Pulse Rate:  [59-71] 68  (05/25 0549) Resp:  [18-20] 18  (05/25 0549) BP: (95-130)/(48-79) 130/79 mmHg (05/25 0549) SpO2:  [100 %] 100 % (05/25 0549) Weight change:  Last BM Date: 07/02/11  Intake/Output from previous day: 05/24 0701 - 05/25 0700 In: 300 [I.V.:300] Out: -  Total I/O In: 960 [P.O.:960] Out: -    Physical Exam: General: Alert, awake, oriented x3, in no acute distress. HEENT: No bruits, no goiter. Heart: Regular rate and rhythm, without murmurs, rubs, gallops. Lungs: Clear to auscultation bilaterally. Abdomen: Soft, nontender, nondistended, positive bowel sounds. Extremities: No clubbing cyanosis or edema with positive pedal pulses. Neuro: Grossly intact, nonfocal.    Lab Results: Basic Metabolic Panel:  Basename 07/03/11 0552 07/02/11 0335  NA 133* 132*  K 3.5 3.2*  CL 102 99  CO2 25 27  GLUCOSE 96 97  BUN 7 8  CREATININE 0.44* 0.47*  CALCIUM 8.3* 7.8*  MG -- --  PHOS -- --   Liver Function Tests:  Basename 07/02/11 0335 07/01/11 0531  AST 25 47*  ALT 21 25  ALKPHOS 57 73  BILITOT 0.2* 1.1  PROT 5.3* 6.5  ALBUMIN 3.1* 3.9   No results found for this basename: LIPASE:2,AMYLASE:2 in the last 72 hours No results found for this basename: AMMONIA:2 in the last 72 hours CBC:  Basename 07/02/11 0335 07/01/11 0531  WBC 5.3 8.2  NEUTROABS -- 5.9  HGB 12.0 13.8  HCT 33.3* 36.7  MCV 88.1 84.6  PLT 223 266   Cardiac Enzymes: No results found for this basename: CKTOTAL:3,CKMB:3,CKMBINDEX:3,TROPONINI:3 in the last 72 hours BNP: No results found for this basename: PROBNP:3 in the last 72 hours D-Dimer: No results found for this basename: DDIMER:2 in the last 72 hours CBG: No results found for this basename: GLUCAP:6 in the last 72  hours Hemoglobin A1C: No results found for this basename: HGBA1C in the last 72 hours Fasting Lipid Panel: No results found for this basename: CHOL,HDL,LDLCALC,TRIG,CHOLHDL,LDLDIRECT in the last 72 hours Thyroid Function Tests: No results found for this basename: TSH,T4TOTAL,FREET4,T3FREE,THYROIDAB in the last 72 hours Anemia Panel: No results found for this basename: VITAMINB12,FOLATE,FERRITIN,TIBC,IRON,RETICCTPCT in the last 72 hours Coagulation: No results found for this basename: LABPROT:2,INR:2 in the last 72 hours Urine Drug Screen: Drugs of Abuse     Component Value Date/Time   LABOPIA NONE DETECTED 07/01/2011 0620   COCAINSCRNUR NONE DETECTED 07/01/2011 0620   LABBENZ NONE DETECTED 07/01/2011 0620   AMPHETMU NONE DETECTED 07/01/2011 0620   THCU NONE DETECTED 07/01/2011 0620   LABBARB NONE DETECTED 07/01/2011 0620    Alcohol Level:  Basename 07/01/11 0531  ETH <11   Urinalysis:  Basename 07/01/11 0620  COLORURINE YELLOW  LABSPEC 1.011  PHURINE 7.5  GLUCOSEU NEGATIVE  HGBUR NEGATIVE  BILIRUBINUR NEGATIVE  KETONESUR NEGATIVE  PROTEINUR NEGATIVE  UROBILINOGEN 0.2  NITRITE NEGATIVE  LEUKOCYTESUR NEGATIVE    Recent Results (from the past 240 hour(s))  URINE CULTURE     Status: Normal   Collection Time   07/01/11  6:20 AM      Component Value Range Status Comment   Specimen Description URINE, CLEAN CATCH   Final    Special Requests Immunocompromised   Final    Culture  Setup Time [id number]   Final  Colony Count 4,000 COLONIES/ML   Final    Culture INSIGNIFICANT GROWTH   Final    Report Status 07/02/2011 FINAL   Final   MRSA PCR SCREENING     Status: Abnormal   Collection Time   07/01/11  9:50 AM      Component Value Range Status Comment   MRSA by PCR POSITIVE (*) NEGATIVE  Final     Studies/Results: No results found.  Medications: Scheduled Meds:   . Chlorhexidine Gluconate Cloth  6 each Topical Q0600  . enoxaparin  40 mg Subcutaneous Q24H  .  haloperidol lactate      . LORazepam      . mupirocin ointment  1 application Nasal BID  . nicotine  14 mg Transdermal Daily  . pantoprazole  40 mg Oral Q1200  . potassium chloride  40 mEq Oral BID   Continuous Infusions:  PRN Meds:.acetaminophen, acetaminophen, haloperidol lactate, LORazepam, ondansetron (ZOFRAN) IV, ondansetron, DISCONTD: LORazepam  Assessment/Plan: 1. hyponatremia:  Secondary to hypovolemia, improved with IVF Cut down IVF now bmet in am 2. history of MS:  Hold Acthar gel  3. history of anxiety disorder, psych disorder not otherwise specified  Requesting to be discharged home , seen by psych will D/w Dr.Bogard re DC options 4. Hypokalemia: replace  5. DVT prophylaxis: lovenox Transfer out of SDU   LOS: 2 days   [facility] Pager: [phone] 07/03/2011, 12:05 PM

## 2011-07-03 NOTE — Progress Notes (Signed)
CSW contacted [facility] to reassess reasons for bed denial.  [facility] the nurse stated  "the MD feels he has done all he can for her due to frequent admissions with little success."  CSW to f/u with other possible inpatient psychiatric placement options.   Leron Croak, LCSWA [employer] [phone]

## 2011-07-03 NOTE — Consult Note (Signed)
Reason for Consult: Psychiatric symptoms secondary to multiple sclerosis and treatment Referring Physician: Dr. Cherene Altes is an 51 y.o. female.  HPI: Patient was known to this provider from our previous encounter in the emergency department. Patient has been suffering with the multiple sclerosis and multiple behavioral and the emotional problems. Patient has a very erratic behavior and reportedly her house was filthy and upon inhabitable. Patient was not able to care for herself and her family. Patient has been received the multiple services but unable to make changes. Patient needed who home health care or out-of-home placement.   Past Medical History  Diagnosis Date  . Multiple sclerosis   . Arthritis   . Anxiety     Past Surgical History  Procedure Date  . Cesarean section     History reviewed. No pertinent family history.  Social History:  reports that she has been smoking.  She has never used smokeless tobacco. She reports that she drinks alcohol. She reports that she does not use illicit drugs.  Allergies: No Known Allergies  Medications: I have reviewed the patient's current medications.  Results for orders placed during the hospital encounter of 07/01/11 (from the past 48 hour(s))  CBC     Status: Abnormal   Collection Time   07/02/11  3:35 AM      Component Value Range Comment   WBC 5.3  4.0 - 10.5 (K/uL)    RBC 3.78 (*) 3.87 - 5.11 (MIL/uL)    Hemoglobin 12.0  12.0 - 15.0 (g/dL)    HCT 16.1 (*) 09.6 - 46.0 (%)    MCV 88.1  78.0 - 100.0 (fL)    MCH 31.7  26.0 - 34.0 (pg)    MCHC 36.0  30.0 - 36.0 (g/dL)    RDW 04.5  40.9 - 81.1 (%)    Platelets 223  150 - 400 (K/uL)   COMPREHENSIVE METABOLIC PANEL     Status: Abnormal   Collection Time   07/02/11  3:35 AM      Component Value Range Comment   Sodium 132 (*) 135 - 145 (mEq/L)    Potassium 3.2 (*) 3.5 - 5.1 (mEq/L)    Chloride 99  96 - 112 (mEq/L)    CO2 27  19 - 32 (mEq/L)    Glucose, Bld 97  70 - 99  (mg/dL)    BUN 8  6 - 23 (mg/dL)    Creatinine, Ser 9.14 (*) 0.50 - 1.10 (mg/dL)    Calcium 7.8 (*) 8.4 - 10.5 (mg/dL)    Total Protein 5.3 (*) 6.0 - 8.3 (g/dL)    Albumin 3.1 (*) 3.5 - 5.2 (g/dL)    AST 25  0 - 37 (U/L)    ALT 21  0 - 35 (U/L)    Alkaline Phosphatase 57  39 - 117 (U/L)    Total Bilirubin 0.2 (*) 0.3 - 1.2 (mg/dL)    GFR calc non Af Amer >90  >90 (mL/min)    GFR calc Af Amer >90  >90 (mL/min)   BASIC METABOLIC PANEL     Status: Abnormal   Collection Time   07/03/11  5:52 AM      Component Value Range Comment   Sodium 133 (*) 135 - 145 (mEq/L)    Potassium 3.5  3.5 - 5.1 (mEq/L)    Chloride 102  96 - 112 (mEq/L)    CO2 25  19 - 32 (mEq/L)    Glucose, Bld 96  70 -  99 (mg/dL)    BUN 7  6 - 23 (mg/dL)    Creatinine, Ser 1.47 (*) 0.50 - 1.10 (mg/dL)    Calcium 8.3 (*) 8.4 - 10.5 (mg/dL)    GFR calc non Af Amer >90  >90 (mL/min)    GFR calc Af Amer >90  >90 (mL/min)     No results found.  No depression, No anxiety, No psychosis and Positive for Medication-induced manic symptoms Blood pressure 130/79, pulse 68, temperature 98 F (36.7 C), temperature source Oral, resp. rate 18, height 5\' 3"  (1.6 m), weight 113 lb 15.7 oz (51.7 kg), SpO2 100.00%.   Assessment/Plan: Manic episode, most most probably steroid-induced  Patient stated that she want to go home but hospital staff is not allowing her to go home. Patient has a poor insight and judgment. Patient seems to be not able to manage herself and her children and her parents. Patient needed out-of-home placement her skilled nursing facility. Please contact social services for further assistance No further psychiatric needs identified. Thank you for psychiatric consultation and followup. Psychiatry will sign off at this case until further requests.  Clarine Elrod,JANARDHAHA R. 07/03/2011, 1:47 PM

## 2011-07-04 MED ORDER — CALCIUM CARBONATE ANTACID 500 MG PO CHEW
1.0000 | CHEWABLE_TABLET | Freq: Four times a day (QID) | ORAL | Status: DC | PRN
  Administered 2011-07-04: 200 mg via ORAL
  Filled 2011-07-04: qty 1

## 2011-07-04 NOTE — Progress Notes (Signed)
CSW left several messages for  and was unable to speak with anyone this weekend.   Pt information was faxed to the oncall admission coordinator on 5/25 with no response.  Dr. Jomarie Longs requested that a CSW speak with the Pt concerning her wanting inpt tx.   CSW unable to speak to the Pt at this time.  Weekday CSW to f/u.

## 2011-07-04 NOTE — Progress Notes (Signed)
Pt seen, angry about being in hospital, thinks we have an agenda against her, Awaiting DC to Psych facility, medically stable CSW working on this.  Zannie Cove, MD [phone]

## 2011-07-05 NOTE — Progress Notes (Signed)
Patient at the desk asking to see Dr. Jomarie Longs, paged.  Patient also wants to review her IVC papers with her Child psychotherapist.  [name] in room to discuss this with her.

## 2011-07-05 NOTE — Progress Notes (Signed)
Follow up with pt on [address] to continue to assess needs and provide emotional support for pt - especially around frustration with IVC and hospitalization  Pt seated in chair.  Pt communicated to chaplain that she was "uncomfortable," but did not elaborate.  Pt was welcoming to chaplain presence, but reported that she was not interested in speaking to chaplain at this point.   Pt welcomed follow up.  Chaplain will continue to follow. Please page as needs arise.    Belva Crome  MDiv, Chaplain

## 2011-07-05 NOTE — Progress Notes (Signed)
CSW consulted consulted with the pt. The pt requested information because she wanted to leave. The pt wanted to know more about her IVC. CSW consulted with the doctor regarding the client status. CSW informed the pt that it was important for her to stay medically and to avoid leaving AMA. The pt was not agreeable to staying, however, did not express that she would leave. The pt expressed concerns about being with her family. CSW also consulted with the pt mother, Delray Alt. Mother informed a little about her history and consulted about information about her daughters DC.    Kayleen Memos. Leighton Ruff [phone]

## 2011-07-05 NOTE — Clinical Social Work Note (Signed)
CSW continues to work towards inpt. psych placement for Pt. [facility] is full until Tuesday and we will check with them in the morning. Pt was refused admission at [facility] over the weekend. CSW continues to search for inpt. Psych placement which has been difficult on a federal and state holiday.  Psych CSW will follow up in the am.  Vennie Homans, Theresia Majors 07/05/2011 11:55 AM [phone]

## 2011-07-06 DIAGNOSIS — E871 Hypo-osmolality and hyponatremia: Secondary | ICD-10-CM

## 2011-07-06 DIAGNOSIS — G35 Multiple sclerosis: Secondary | ICD-10-CM

## 2011-07-06 DIAGNOSIS — F22 Delusional disorders: Secondary | ICD-10-CM

## 2011-07-06 DIAGNOSIS — Z8659 Personal history of other mental and behavioral disorders: Secondary | ICD-10-CM

## 2011-07-06 DIAGNOSIS — E876 Hypokalemia: Secondary | ICD-10-CM

## 2011-07-06 MED ORDER — POTASSIUM CHLORIDE CRYS ER 20 MEQ PO TBCR: 40.0000 meq | EXTENDED_RELEASE_TABLET | Freq: Every day | ORAL | Status: DC

## 2011-07-06 NOTE — Progress Notes (Signed)
Per Christiane Ha at [facility], Pt has been accepted.  Notified Pt, RN and MD.  RN to give report.  CSW arranged for transportation.  Pt to be d/c'd.  Providence Crosby, LCSWA Clinical Social Work [phone]

## 2011-07-06 NOTE — Progress Notes (Signed)
Per Christiane Ha at [facility], Pt's information received and currently being reviewed.  CSW to continue to follow.  Providence Crosby, LCSWA Clinical Social Work [phone]

## 2011-07-06 NOTE — Progress Notes (Signed)
Report given to Windy Canny at [facility].  No further questions at this time.

## 2011-07-06 NOTE — Discharge Summary (Addendum)
Physician Discharge Summary  Patient ID: ALIZON SCHMELING MRN: [id number] DOB/AGE: Oct 07, 1960 51 y.o.  Admit date: 07/01/2011 Discharge date: 07/06/2011  Primary Care Physician: Dr.Fried Neurologist: Dr. Loleta Chance at [facility] neurology  Discharge Diagnoses:   1. Hyponatremia improved 2. Altered mental status resolved 3. mood disorder 4. Bizarre and Manic behaviors with psychotic features 4. Hypokalemia 5. multiple sclerosis 6. history of anxiety     Medication List  As of 07/06/2011  1:24 PM   TAKE these medications         lisdexamfetamine 50 MG capsule   Commonly known as: VYVANSE   Take 50 mg by mouth every morning.      meloxicam 15 MG tablet   Commonly known as: MOBIC   Take 15 mg by mouth daily.      modafinil 200 MG tablet   Commonly known as: PROVIGIL   Take 200-400 mg by mouth daily as needed.      omeprazole 20 MG capsule   Commonly known as: PRILOSEC   Take 20 mg by mouth daily.      potassium chloride SA 20 MEQ tablet   Commonly known as: K-DUR,KLOR-CON   Take 2 tablets (40 mEq total) by mouth daily.             Disposition and Follow-up:  Psychiatry  Dr. Loleta Chance her neurologist in one month  Consults: Dr. Ferol Luz psychiatry  Brief H and P: Ms. Gluth is a 51 year old female with history of multiple sclerosis, anxiety and depression, patient history is extremely vague and this point, she reports that she called EMS because she was having nausea, feeling unwell and apprehensive.  When EMS arrived they found that her house was disheveled, filthy with a lot of cats and brought her to the emergency room for evaluation, where she was noted to have a sodium of 113, and a potassium of 2.2.  She reports recalling today that her mind was not quite right, but denies any drug overdose, trauma, fevers or chills  She thinks she may have vomited couple of times the last 2 days, denies any diarrhea, she reports recently starting Acthar gel shots for her multiple sclerosis, and  may have taken a dose yesterday.  Hospital Course:  1. Hyponatremia this was felt to be secondary to dehydration, poor by mouth intake She was confused on admission as well, her urine sodium and osmolarity was consistent with prerenal state as well. Her sodium improved with IV fluids to 133, up from 113 on admission. In addition she was also hypokalemic: Due recent history of vomiting,  improved with potassium supplementation. She is being discharged on 40 mEq of potassium a day, recommend B. met in one week to followup on her potassium. 2 history of MS stable continued on home medications, Acthar gel was discontinued. 3.   Mood disorder not otherwise specified, due to her erratic and bizarre behavior a psychiatric consultation was obtained which was provided by Dr. Mickeal Skinner, we were also able to contact her ACT team member and her mother, they have been reports that she was frequently visiting magistrate at odd hours in the night insisting that her children(twins age 41) be removed from home or another version to be put into juvenile detention, as well as abruptly calling police on random occasions saying that she cannot find her children when they're outside playing/swinging. Several instances like this have been reported. Hence psychiatry placed her on involuntary commitment, and recommended placement to psychiatric facility. Hence she is being  discharged to such a facility   Time spent on Discharge:  Signed: Shalom Ware [facility]  07/06/2011, 1:24 PM

## 2011-07-06 NOTE — Progress Notes (Signed)
Patient states she is going home, informed her Marchelle Folks the Child psychotherapist has made arrangements for transportation to [facility].  She is upset because she thought she was going home instead and will not be sent to a facility.  Marchelle Folks notified and will come and speak with patient

## 2011-07-06 NOTE — Progress Notes (Signed)
Updated IVC paperwork.  Alerted Magistrate to impending fax.  Confirmed receipt by [employer] of fax.  Providence Crosby, LCSWA Clinical Social Work [phone]

## 2011-07-06 NOTE — Progress Notes (Signed)
Sheriff at nursing station to pick patient up to take to [facility].  All belongings with patient.  Patient without IV access.  Patient tearful at this time as she believes she is well enough to go home.  Patient's Psychiatrist and social worker in to see patient multiple times today providing encouragement and providing her with the information she requested but patient still upset that she is not going home.  Patient was escorted by sheriff to elevator.  Patient without questions at this time.  Patient discharged.

## 2011-07-06 NOTE — Progress Notes (Signed)
Patient Identification:  Anne Black Date of Evaluation:  07/06/2011 Reason for Consult: Altered mental status  Hyponatremia     Referring Provider: Dr. Jomarie Longs History of Present Illness:Pt called EMS for N/V  EMS found home filthy and cats in home.  Police were called and CPS was notified.  In hospital she displays very poor judgment and she has been very guarded.  She alludes to problems with her twin daughters and when questioned for details, she responds it is a very 'personal' matter.  She is unable on multiple interviews to report any substantial problems, yet, she has reportedly gone to the magistrate late at night demanding her twins be placed in juvenile detention.  Reportedly the maternal grandmother has been contacting mental health services to no avail regarding her daughter's [pt] mental status.  She had contacted CPS and no action to date has been taken.   She is admitted with IVC that is continued for her disjointed, paranoid responses to multiple staff members, Child psychotherapist and psychiatrist.     Past Psychiatric History: She declines to discuss prior mental health issues   Past Medical History:     Past Medical History  Diagnosis Date  . Multiple sclerosis   . Arthritis   . Anxiety        Past Surgical History  Procedure Date  . Cesarean section     Allergies: No Known Allergies   Mental Status Examination/Evaluation: Objective:  Appearance: Casual  Psychomotor Activity:  Normal  Eye Contact::  Good  Speech:  clear and paranoid; guarded declines to offer details, information  Volume:  Normal  Mood:  Angry, Anxious and Dysphoric  Affect:  Blunt, Constricted and Restricted  Thought Process:  Irrelevant, Disorganized and evasive, She has articulate skills and talks without substantial information, protests her IVC but is unable to recognize her inconsistent logic  Orientation:  Full  Thought Content:  Delusions and Paranoia  Suicidal Thoughts:  No  Homicidal  Thoughts:  No  Judgement:  Impaired poor  Insight:  Lacking and Claims everything is normal    DIAGNOSIS:   AXIS I   Delusional disorder secondary to neurodegenerative disease; Multiple Sclerosis  AXIS II  Deferred  AXIS III See medical notes.  AXIS IV other psychosocial or environmental problems, problems related to social environment and altered mental status and unrealistic assessment of her mental status  AXIS V 51-60 moderate symptoms   Assessment/Plan: Discussed with Dr. Jomarie Longs, Psych CSW, Pt interviewed Pt is very uncomfortable participating in evaluation "why are you asking these questions".  She says she want the RN present Francis Dowse is involved in discharge], Psych CSW present, steps out "I can't talk because she is not present".  She says she manages to keep her household, takes care of her twin daughters and when asked why she wanted to request juvenile detention.  She declines to report any conduct disorder behaviors; just 'back talk'.  She says other 'abusive' things they have done 'are personal matters' i.e. No Information.  She then complains that she is asked questions.  She says she feels like it becomes 2 against 1. [NB reversal of complaint]. She denies she has been an inpatient in any psychiatric facility.  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      Pt. Is very persistent saying that she can manage everything in her life,  EMS witness contradicts her claim.  Agree with weekend consultation.  She needs full  psychiatric evaluation and possibly medication.  PCP/MD managing MS may be useful in decision of which psychiatric medications may be effective with MS medications [not available on inquiry].      She is unwilling to understand the very unusual behavior she has exhibited during this hospital stay.  She does not exhibit the capacity, even though she can appear oriented, to understand how she distorts the reality of her  mismanaged home and distortion of her twins behavior [contradicted by the maternal grandmother].   Andrea Ferrer J. Ferol Luz, MD Psychiatrist  07/06/2011 1:41 PM

## 2011-07-06 NOTE — Progress Notes (Signed)
Faxed Pt's info to [facility] [address] and [place].  CSW to continue to follow.  Providence Crosby, LCSWA Clinical Social Work [phone]

## 2011-07-20 ENCOUNTER — Ambulatory Visit: Payer: BC Managed Care – PPO | Admitting: Licensed Clinical Social Worker

## 2011-08-18 ENCOUNTER — Ambulatory Visit (HOSPITAL_COMMUNITY): Payer: BC Managed Care – PPO | Admitting: Physician Assistant

## 2011-08-18 ENCOUNTER — Emergency Department (HOSPITAL_COMMUNITY)
Admission: EM | Admit: 2011-08-18 | Discharge: 2011-08-18 | Disposition: A | Discharge status: Home / Self Care | Payer: BC Managed Care – PPO

## 2011-08-18 DIAGNOSIS — R209 Unspecified disturbances of skin sensation: Secondary | ICD-10-CM | POA: Insufficient documentation

## 2011-08-18 DIAGNOSIS — F411 Generalized anxiety disorder: Secondary | ICD-10-CM | POA: Insufficient documentation

## 2011-08-18 DIAGNOSIS — F17200 Nicotine dependence, unspecified, uncomplicated: Secondary | ICD-10-CM | POA: Insufficient documentation

## 2011-08-18 DIAGNOSIS — IMO0002 Reserved for concepts with insufficient information to code with codable children: Secondary | ICD-10-CM | POA: Insufficient documentation

## 2011-08-18 DIAGNOSIS — Z8739 Personal history of other diseases of the musculoskeletal system and connective tissue: Secondary | ICD-10-CM | POA: Insufficient documentation

## 2011-08-18 DIAGNOSIS — G35 Multiple sclerosis: Secondary | ICD-10-CM | POA: Insufficient documentation

## 2011-08-18 NOTE — ED Notes (Signed)
She has had numerus panic attacks in the past 2-3 days and she also has had numbness inher back arms and legs.  She has ms+

## 2011-08-19 ENCOUNTER — Emergency Department (HOSPITAL_COMMUNITY)
Admission: EM | Admit: 2011-08-19 | Discharge: 2011-08-19 | Disposition: A | Discharge status: Home / Self Care | Payer: BC Managed Care – PPO | Attending: Emergency Medicine | Admitting: Emergency Medicine

## 2011-08-19 DIAGNOSIS — M792 Neuralgia and neuritis, unspecified: Secondary | ICD-10-CM

## 2011-08-19 DIAGNOSIS — G35 Multiple sclerosis: Secondary | ICD-10-CM

## 2011-08-19 LAB — CBC WITH DIFFERENTIAL/PLATELET
Basophils Absolute: 0.1 10*3/uL (ref 0.0–0.1)
Basophils Relative: 1 % (ref 0–1)
Eosinophils Absolute: 0.2 10*3/uL (ref 0.0–0.7)
Eosinophils Relative: 3 % (ref 0–5)
HCT: 36.1 % (ref 36.0–46.0)
MCH: 32.5 pg (ref 26.0–34.0)
MCHC: 35.2 g/dL (ref 30.0–36.0)
Monocytes Absolute: 0.6 10*3/uL (ref 0.1–1.0)
Monocytes Relative: 10 % (ref 3–12)
Neutro Abs: 3.7 10*3/uL (ref 1.7–7.7)
RDW: 12.7 % (ref 11.5–15.5)

## 2011-08-19 LAB — URINALYSIS, ROUTINE W REFLEX MICROSCOPIC
Bilirubin Urine: NEGATIVE
Glucose, UA: NEGATIVE mg/dL
Ketones, ur: NEGATIVE mg/dL
Leukocytes, UA: NEGATIVE
Nitrite: NEGATIVE
Protein, ur: NEGATIVE mg/dL

## 2011-08-19 LAB — COMPREHENSIVE METABOLIC PANEL
AST: 27 U/L (ref 0–37)
Albumin: 4.5 g/dL (ref 3.5–5.2)
BUN: 3 mg/dL — ABNORMAL LOW (ref 6–23)
Calcium: 9.3 mg/dL (ref 8.4–10.5)
Chloride: 96 mEq/L (ref 96–112)
Creatinine, Ser: 0.61 mg/dL (ref 0.50–1.10)
Total Protein: 7.4 g/dL (ref 6.0–8.3)

## 2011-08-19 MED ORDER — PREDNISONE 20 MG PO TABS: ORAL_TABLET | ORAL | Status: AC

## 2011-08-19 MED ORDER — PREGABALIN 75 MG PO CAPS: 75.0000 mg | ORAL_CAPSULE | Freq: Two times a day (BID) | ORAL | Status: DC

## 2011-08-19 MED ORDER — BUSPIRONE HCL 10 MG PO TABS: 10.0000 mg | ORAL_TABLET | Freq: Three times a day (TID) | ORAL | Status: DC

## 2011-08-19 NOTE — ED Notes (Signed)
Pt continues to feel like the numbness and burning in her face is r/t her MS.  No increase.  Continues to be able to speak in clear sentences.  Pt feels she is beginning to have another panic attack.  Encouraged to perform breathing exercises.

## 2011-08-19 NOTE — ED Provider Notes (Signed)
History     CSN: [id number]  Arrival date & time 08/18/11  2347   First MD Initiated Contact with Patient 08/19/11 0503      Chief Complaint  Patient presents with  . Numbness    (Consider location/radiation/quality/duration/timing/severity/associated sxs/prior treatment) HPI Comments: Patient presents complaining of multiple panic attacks this week and worsening of her MS. She states her MS involves numbness in her upper and lower extremities as well as in the left side of her face. She denies any weakness or falls. She states for a long time she's had neuropathic pain for which she was taking Lyrica but now she does not have a doctor and has been out of the Lyrica which she states truly helped with her pain. Pain is a 6/10 and burning in nature. She complains of it in a stocking glove distribution as well as having tingling in the left side of her face and around her lips today when she was having a panic attack. She states that numbness has not resolved.  The history is provided by the patient.    Past Medical History  Diagnosis Date  . Multiple sclerosis   . Arthritis   . Anxiety     Past Surgical History  Procedure Date  . Cesarean section     No family history on file.  History  Substance Use Topics  . Smoking status: Current Everyday Smoker  . Smokeless tobacco: Never Used  . Alcohol Use: Yes     occasional    OB History    Grav Para Term Preterm Abortions TAB SAB Ect Mult Living                  Review of Systems  Constitutional: Negative for fever and chills.  Genitourinary: Negative for dysuria, urgency, flank pain, decreased urine volume and pelvic pain.  Neurological: Positive for numbness. Negative for dizziness, seizures, speech difficulty, weakness and headaches.  Psychiatric/Behavioral: Negative for confusion.  All other systems reviewed and are negative.    Allergies  Review of patient's allergies indicates no known allergies.  Home  Medications   Current Outpatient Rx  Name Route Sig Dispense Refill  . LISDEXAMFETAMINE DIMESYLATE 50 MG PO CAPS Oral Take 50 mg by mouth every morning.    Marland Kitchen MAGNESIA PO Oral Take by mouth.    . MELOXICAM 15 MG PO TABS Oral Take 15 mg by mouth daily.    Marland Kitchen MODAFINIL 200 MG PO TABS Oral Take 200 mg by mouth daily.       BP 137/63  Pulse 68  Temp 98.2 F (36.8 C) (Oral)  Resp 24  SpO2 100%  Physical Exam  Nursing note and vitals reviewed. Constitutional: She is oriented to person, place, and time. She appears well-developed and well-nourished. No distress.  HENT:  Head: Normocephalic and atraumatic.  Eyes: EOM are normal. Pupils are equal, round, and reactive to light.  Cardiovascular: Normal rate, regular rhythm, normal heart sounds and intact distal pulses.  Exam reveals no friction rub.   No murmur heard. Pulmonary/Chest: Effort normal and breath sounds normal. She has no wheezes. She has no rales.  Abdominal: Soft. Bowel sounds are normal. She exhibits no distension. There is no tenderness. There is no rebound and no guarding.  Musculoskeletal: Normal range of motion. She exhibits no tenderness.       No edema  Neurological: She is alert and oriented to person, place, and time. She has normal strength. No cranial nerve deficit or sensory  deficit. Coordination and gait normal.  Skin: Skin is warm and dry. No rash noted.  Psychiatric: She has a normal mood and affect. Her speech is normal and behavior is normal. Her mood appears not anxious.    ED Course  Procedures (including critical care time)  Labs Reviewed  COMPREHENSIVE METABOLIC PANEL - Abnormal; Notable for the following:    Potassium 3.1 (*)     BUN 3 (*)     All other components within normal limits  URINALYSIS, ROUTINE W REFLEX MICROSCOPIC  CBC WITH DIFFERENTIAL   No results found.   1. Multiple sclerosis   2. Neuropathic pain       MDM   Patient states earlier she was having a flare in her. She  complains of some tingling in the left side of her face and on the right leg consistent with her MS. Patient states that she was on Lyrica but has not seen a family doctor as she is in between doctors and has been out for several months. Patient denies any bladder or bowel incontinence or any weakness. She ambulated around the department without difficulty and states she feels better now. She is not displaying any significant neurologic deficits at this time.  Patient given a prescription for Lyrica and prednisone as she states the prednisone helps with her MS starts to flare. She was encouraged to followup with neurology for further treatment of her MS and possible long-term MS options. Secondly patient states she's had multiple panic attacks this week and also states that she is supposed to be taking BuSpar which she is also ran out of. Patient denies any homicidal or suicidal ideation. She does not appear anxious at this time. She was not given any benzodiazepines but was represcribed BuSpar she states she was taking. Again patient was encouraged to follow up with primary care. As there appears to be no acute issues going on with the patient she has negative lab results other than a mild hypokalemia she was encouraged to drink orange juice or a banana as it was discharged home.        Gwyneth Sprout, MD 08/19/11 [phone]

## 2011-09-14 ENCOUNTER — Inpatient Hospital Stay (HOSPITAL_COMMUNITY)
Admission: EM | Admit: 2011-09-14 | Discharge: 2011-09-15 | DRG: 296 | Disposition: A | Discharge status: Home / Self Care | Payer: BC Managed Care – PPO | Attending: Internal Medicine | Admitting: Internal Medicine

## 2011-09-14 ENCOUNTER — Encounter (HOSPITAL_COMMUNITY): Payer: Self-pay | Admitting: *Deleted

## 2011-09-14 DIAGNOSIS — F17200 Nicotine dependence, unspecified, uncomplicated: Secondary | ICD-10-CM | POA: Diagnosis present

## 2011-09-14 DIAGNOSIS — M129 Arthropathy, unspecified: Secondary | ICD-10-CM | POA: Diagnosis present

## 2011-09-14 DIAGNOSIS — D649 Anemia, unspecified: Secondary | ICD-10-CM | POA: Diagnosis present

## 2011-09-14 DIAGNOSIS — F411 Generalized anxiety disorder: Secondary | ICD-10-CM | POA: Diagnosis present

## 2011-09-14 DIAGNOSIS — F909 Attention-deficit hyperactivity disorder, unspecified type: Secondary | ICD-10-CM | POA: Diagnosis present

## 2011-09-14 DIAGNOSIS — G35 Multiple sclerosis: Secondary | ICD-10-CM | POA: Diagnosis present

## 2011-09-14 DIAGNOSIS — E871 Hypo-osmolality and hyponatremia: Secondary | ICD-10-CM

## 2011-09-14 DIAGNOSIS — E876 Hypokalemia: Secondary | ICD-10-CM | POA: Diagnosis present

## 2011-09-14 LAB — BASIC METABOLIC PANEL
BUN: 14 mg/dL (ref 6–23)
CO2: 29 mEq/L (ref 19–32)
Calcium: 9.3 mg/dL (ref 8.4–10.5)
GFR calc non Af Amer: >90 mL/min (ref >90)
Glucose, Bld: 96 mg/dL (ref 70–99)
Potassium: 3.4 mEq/L — ABNORMAL LOW (ref 3.5–5.1)
Sodium: 121 mEq/L — ABNORMAL LOW (ref 135–145)

## 2011-09-14 LAB — CBC WITH DIFFERENTIAL/PLATELET
Eosinophils Absolute: 0.1 10*3/uL (ref 0.0–0.7)
Eosinophils Relative: 1 % (ref 0–5)
Hemoglobin: 12.4 g/dL (ref 12.0–15.0)
Lymphocytes Relative: 16 % (ref 12–46)
Lymphs Abs: 1.7 10*3/uL (ref 0.7–4.0)
MCH: 32 pg (ref 26.0–34.0)
MCV: 91.5 fL (ref 78.0–100.0)
Monocytes Relative: 10 % (ref 3–12)
Platelets: 264 10*3/uL (ref 150–400)
RBC: 3.88 MIL/uL (ref 3.87–5.11)
WBC: 11 10*3/uL — ABNORMAL HIGH (ref 4.0–10.5)

## 2011-09-14 LAB — URINALYSIS, ROUTINE W REFLEX MICROSCOPIC
Bilirubin Urine: NEGATIVE
Glucose, UA: NEGATIVE mg/dL
Hgb urine dipstick: NEGATIVE
Specific Gravity, Urine: 1.015 (ref 1.005–1.030)

## 2011-09-14 LAB — URINE MICROSCOPIC-ADD ON

## 2011-09-14 MED ORDER — SODIUM CHLORIDE 0.9 % IV SOLN
1000.0000 mg | Freq: Once | INTRAVENOUS | Status: AC
  Administered 2011-09-14: 1000 mg via INTRAVENOUS
  Filled 2011-09-14: qty 8

## 2011-09-14 MED ORDER — ONDANSETRON HCL 4 MG/2ML IJ SOLN
4.0000 mg | Freq: Once | INTRAMUSCULAR | Status: AC
  Administered 2011-09-14: 4 mg via INTRAVENOUS
  Filled 2011-09-14: qty 2

## 2011-09-14 MED ORDER — SODIUM CHLORIDE 0.9 % IV SOLN
INTRAVENOUS | Status: DC
  Administered 2011-09-14: 125 mL/h via INTRAVENOUS
  Administered 2011-09-15: 02:00:00 via INTRAVENOUS

## 2011-09-14 NOTE — ED Notes (Signed)
Pt unable to give a specific time when this "flare up" started, pt states "I'm transitioning from the [facility] neurologists, I just need IV solumedrol, my talking is off, I just feel bad".

## 2011-09-14 NOTE — ED Notes (Signed)
Pt c/o burning in back, arms, and legs. Pt states movement helps her, but right side feels a little bit less than left. Pt refused to get in gown. Pt states she has a lot of things to do and wants to feel better and function better. Pt reports she has between two doctors right now. Pt is anxious and reports having anxiety attacks.

## 2011-09-14 NOTE — ED Notes (Signed)
Pt remaining in Triage stating "I've had bad, bad, bad experiences here @ this hospital & I don't want to talk about it"; pt offered to go to room or sign out AMA by Morrie Sheldon, RN; pt stating "I don't want any trouble if I live AMA, can you ask the doctor to see me"

## 2011-09-14 NOTE — ED Notes (Signed)
Pt consistently anxious during visit. Pt c/o not being able to call daughter due to cell phone in car. Pt given two phones (handheld and in room) to contact family. Pt constantly keeps crying, because she wants to talk to daughter. After many filled attempts daughter's phone is off.

## 2011-09-14 NOTE — ED Provider Notes (Signed)
History     CSN: [id number]  Arrival date & time 09/14/11  1627   First MD Initiated Contact with Patient 09/14/11 2033      Chief Complaint  Patient presents with  . MS flareup-generalized pain   . Panic Attack    (Consider location/radiation/quality/duration/timing/severity/associated sxs/prior treatment) The history is provided by the patient and medical records.   51 year old, female, with history of multiple sclerosis, presents emergency department complaining of myalgias.  On both sides of her body in her arms and legs as well, as numbness and tingling.  She also has epigastric pain.  She denies vision changes, dizziness, headache.  She denies weakness.  She denies cough, or shortness of breath.  She has not had nausea, vomiting, diarrhea, or urinary tract symptoms.  She does say sometimes it is difficult for her to urinate.  She is not presently taking anything for her MS, because she says the medicine.  She was taking have had intolerable side effects.  Past Medical History  Diagnosis Date  . Multiple sclerosis   . Arthritis   . Anxiety     Past Surgical History  Procedure Date  . Cesarean section     No family history on file.  History  Substance Use Topics  . Smoking status: Current Everyday Smoker  . Smokeless tobacco: Never Used  . Alcohol Use: Yes     occasional    OB History    Grav Para Term Preterm Abortions TAB SAB Ect Mult Living                  Review of Systems  Constitutional: Negative for fever.  HENT: Negative for neck pain.   Eyes: Negative for visual disturbance.  Respiratory: Negative for cough and shortness of breath.   Cardiovascular: Negative for chest pain.  Gastrointestinal: Positive for abdominal pain. Negative for nausea, vomiting and diarrhea.  Genitourinary: Negative for dysuria and hematuria.  Musculoskeletal: Positive for myalgias. Negative for back pain.  Skin: Negative for rash.  Neurological: Positive for numbness.  Negative for weakness and headaches.  Hematological: Does not bruise/bleed easily.  Psychiatric/Behavioral: Negative for confusion.  All other systems reviewed and are negative.    Allergies  Review of patient's allergies indicates no known allergies.  Home Medications   Current Outpatient Rx  Name Route Sig Dispense Refill  . BUSPIRONE HCL 10 MG PO TABS Oral Take 10 mg by mouth 3 (three) times daily.    Marland Kitchen LISDEXAMFETAMINE DIMESYLATE 50 MG PO CAPS Oral Take 50 mg by mouth every morning.    Marland Kitchen MELOXICAM 15 MG PO TABS Oral Take 15 mg by mouth daily.    Marland Kitchen MODAFINIL 200 MG PO TABS Oral Take 200 mg by mouth daily.     Marland Kitchen PREGABALIN 75 MG PO CAPS Oral Take 75 mg by mouth 2 (two) times daily.      Wt 120 lb (54.432 kg)  Physical Exam  Nursing note reviewed. Constitutional: She is oriented to person, place, and time. She appears well-developed and well-nourished. No distress.  HENT:  Head: Normocephalic and atraumatic.  Eyes: Conjunctivae are normal. Pupils are equal, round, and reactive to light.  Neck: Normal range of motion. Neck supple.  Cardiovascular: Normal rate and intact distal pulses.   No murmur heard. Pulmonary/Chest: Effort normal and breath sounds normal.  Abdominal: Soft. Bowel sounds are normal.  Musculoskeletal: Normal range of motion. She exhibits no edema.  Neurological: She is alert and oriented to person, place, and time.  She has normal strength. She displays no tremor. No cranial nerve deficit. GCS eye subscore is 4. GCS verbal subscore is 5. GCS motor subscore is 6.  Skin: Skin is warm and dry.  Psychiatric: She has a normal mood and affect. Thought content normal.    ED Course  Procedures (including critical care time) 51 year old, female, with MS, presents to emergency department complaining of an MS flair manifested by myalgias, and paresthesias in her upper and lower extremities.  We will establish an IV checked her blood and urine and give her a gram of  Solu-Medrol.   Labs Reviewed  CBC WITH DIFFERENTIAL  BASIC METABOLIC PANEL  URINALYSIS, ROUTINE W REFLEX MICROSCOPIC   No results found.   No diagnosis found.  11:03 PM Spoke with Dr. Selena Batten.  He will admit.  MDM  MS exacerbation Severe hyponatremia         Cheri Guppy, MD 09/14/11 [phone]

## 2011-09-14 NOTE — ED Notes (Signed)
Pt now stating "can they write me prescriptions for Vyvanse and provigil, that's in my records"; pt informed pt EDP's do not write for Vyvanse, pt continues to stand @ desk and state "I just need to get into a neurologist right away, that's really the issue"

## 2011-09-14 NOTE — ED Notes (Signed)
Pt now stating "maybe I just need to go to UC & get something"

## 2011-09-14 NOTE — ED Notes (Signed)
Nurse Eden Emms) want the pt. To have crackers,peanut butter

## 2011-09-14 NOTE — ED Notes (Signed)
MD at bedside. 

## 2011-09-14 NOTE — H&P (Addendum)
[facility] History and Physical  Anne Black ZOX:[id number] DOB: 01-03-1961 DOA: 09/14/2011  Referring physician: Ashley Mariner PCP: No primary provider on file.   Chief Complaint:   hyponatremia  HPI:  51 yo female with hx of MS has c/o feeling bad.  Pt will not be more specific.  Pt was found to have hyponatermia.  ED requests that we admit the patient for hyponatremia.   Review of Systems:  Negative for all organ systems except for + above   Past Medical History  Diagnosis Date  . Multiple sclerosis   . Arthritis   . Anxiety    Past Surgical History  Procedure Date  . Cesarean section    Social History:  reports that she has been smoking Cigarettes.  She has a 20 pack-year smoking history. She has never used smokeless tobacco. She reports that she drinks about 2 ounces of alcohol per week. She reports that she does not use illicit drugs. Lives by self.  Pt can participate in ADLs?  No Known Allergies  No family history on file.   (be sure to complete)  Prior to Admission medications   Medication Sig Start Date End Date Taking? Authorizing Provider  busPIRone (BUSPAR) 10 MG tablet Take 10 mg by mouth 3 (three) times daily. 08/19/11 08/18/12 Yes Gwyneth Sprout, MD  lisdexamfetamine (VYVANSE) 50 MG capsule Take 50 mg by mouth every morning.   Yes Historical Provider, MD  meloxicam (MOBIC) 15 MG tablet Take 15 mg by mouth daily.   Yes Historical Provider, MD  modafinil (PROVIGIL) 200 MG tablet Take 200 mg by mouth daily.    Yes Historical Provider, MD  pregabalin (LYRICA) 75 MG capsule Take 75 mg by mouth 2 (two) times daily. 08/19/11 08/18/12 Yes Gwyneth Sprout, MD   Physical Exam: Filed Vitals:   09/14/11 1808 09/14/11 2121 09/14/11 2332  BP:  117/83 131/66  Pulse:  97 77  Temp:  97.9 F (36.6 C)   TempSrc:  Oral   Resp:  14 16  Weight: 54.432 kg (120 lb)    SpO2:  100% 95%     General:    Eyes: anicteric, no nystamus, eomi, pupils 1.62mm symmetric,  direct, consensual intact  ENT: mm dry  Neck: no jvd, no bruit, no tm, no adenoapthy  Cardiovascular: rrr s1, s2, no m/g/r  Respiratory: ctab  Abdomen: soft, nt, nd, +bs  Skin: no rash,   Musculoskeletal: nl tone  Psychiatric: axox3  Neurologic: nonfocal  Labs on Admission:  Basic Metabolic Panel:  Lab 09/14/11 8119  NA 121*  K 3.4*  CL 83*  CO2 29  GLUCOSE 96  BUN 14  CREATININE 0.68  CALCIUM 9.3  MG --  PHOS --   Liver Function Tests: No results found for this basename: AST:5,ALT:5,ALKPHOS:5,BILITOT:5,PROT:5,ALBUMIN:5 in the last 168 hours No results found for this basename: LIPASE:5,AMYLASE:5 in the last 168 hours No results found for this basename: AMMONIA:5 in the last 168 hours CBC:  Lab 09/14/11 2115  WBC 11.0*  NEUTROABS 8.0*  HGB 12.4  HCT 35.5*  MCV 91.5  PLT 264   Cardiac Enzymes: No results found for this basename: CKTOTAL:5,CKMB:5,CKMBINDEX:5,TROPONINI:5 in the last 168 hours  BNP (last 3 results) No results found for this basename: PROBNP:3 in the last 8760 hours CBG: No results found for this basename: GLUCAP:5 in the last 168 hours  Radiological Exams on Admission: No results found.  EKG: Independently reviewed.   Assessment/Plan Active Problems:  Hypokalemia  MS (multiple sclerosis)  Anemia   1. Hyponatremia,  Check cortisol, tsh, serum osm, urine osm, urine sodium, avoid diuretic. Hydrate gently with ns iv 2. Hypokalemia:  Replete k 3. Anemia:  Repeat cbc in am, check iron studies b12, folate spep, upep, heme occult stool 4. MS:  Stable 5. ADHD  Code Status: Full code Family Communication:  Disposition Plan: home when stable  Time spent: 40 minutes  Pearson Grippe [facility] Pager [phone] tonight only, otherwise [facility]   [email or web address] Password [email or web address] 09/14/2011, 11:56 PM

## 2011-09-14 NOTE — ED Notes (Signed)
Informed pt. That she need urine specimen

## 2011-09-14 NOTE — ED Notes (Signed)
Pt refused a gown. Pt wants door open, because she's lonely. Door kept open. Pt requesting to smoke. Charge nurse and two other RNs informed her that this is a non-smoking facility.

## 2011-09-14 NOTE — ED Notes (Signed)
Pt. Is refusing to put on gown,asking for food,wants to leave

## 2011-09-14 NOTE — ED Notes (Signed)
Additional notes:  Pt has, for her entire stay in the waiting room, been in and out of the waiting room/outside.  On several occasions she would come to the window and repeat how she wasn't sure she would get the treatment she needed here and was debating on going to the UC.  She would not seem  To be able to decide what she wanted to do nor would she get up and move on to let people behind her check in.  She would at times be slow to respond, but otherwise appear to be neurologically intact.

## 2011-09-15 ENCOUNTER — Encounter (HOSPITAL_COMMUNITY): Payer: Self-pay | Admitting: Internal Medicine

## 2011-09-15 NOTE — ED Notes (Addendum)
Pt has been difficult with process. Pt has been informed of her options. Dr. Nino Parsley informed her that if she chose to leave she would be IVC. Dr. Selena Batten, the hospitalist, informed us that she can leave AMA, but will not be discharging her and/or can go to the room she was assigned to. Pt was informed of this, but stated she could not make her decision and will inform us when she wants to. Pt was seen by me, the charge nurse, the house supervisor, the attending physician, and the hospitalist. Pt refused to make decision for hours and kept switching between leaving AMA and going to the floor. Pt was treated well with care and given two sandwiches, soda, multiple grahams crackers, peanut butter, saltine crackers, water, and hot tea. Pt complained about communication, but everytime we informed her of her plan she would ask, "What?" Pt kept pacing the halls, walking out of the room, and constantly calling the call bell. Call bells were answered by RN and complaints were addressed. Pt has been crying on and off all night. Pt will ambulate normally to bathroom and then all of a sudden stare at the wall once back in room and sit down crying stating she is lonely and wants to see her kids. Pt kept being informed she could leaving and states she will leave but then states she wants a sandwich and made no attempt to leave. After much discussion pt decided she will leave AMA and printed form was signed and witnessed by RN, Press photographer, and security. Pt was escorted out of building and was given a form of patient rights.

## 2011-09-15 NOTE — ED Notes (Signed)
Pt was walked to car earlier with charge RN and [employer] as escort for her to get belongings such as cell phone and charger. Pt escorted back to room with charge RN and [employer]. Pt has been sitting in room with food.

## 2011-09-15 NOTE — ED Notes (Signed)
In patient room multiple times since patient arrival speaking with patient at long intervals regarding her care.  Patient stating she is not happy with her care, stating she wants to leave, stating she wants to go to [facility].  Complaining whenever more than one nurse walks into room; Complaining about noises being made by another patient in another room.  Dr Selena Batten notified of patient stating she wants to leave- Dr Selena Batten states ok for patient to leave AMA.  When patient notified of choices of leaving AMA or going to the floor after extended conversation pt requesting for patient advocate stating nurses were unkind, overpowering and inconsiderate.  Wells Guiles  [facility] notified and in to speak with patient.  Earlier in shift Billee Cashing [facility] in with patient to speak with her concerning similar issues

## 2011-09-15 NOTE — ED Notes (Signed)
Pt given options of going to her assigned admitted bed or leaving AMA; Patient chose to leave AMA after several hours of deliberating with nursing staff and [facility].  Patient signed AMA paperwork and was walked to exit by [employer].  Dr Selena Batten notified.

## 2011-09-15 NOTE — ED Notes (Signed)
Wells Guiles in room for past hour speaking with patient.  Dr Selena Batten notified of patient actions and states he will not discharge the patient but she can leave AMA.  In to speak with patient regarding this and patient continues to ask same questions repeatedly; Insulting care she has received and giving suggestions for care of other patients in the department.

## 2011-09-15 NOTE — ED Notes (Signed)
MD left bedside

## 2011-09-15 NOTE — ED Notes (Signed)
Patient presented back to the ER. States that she needed to make sure that she was correct in the information that she was given. States that she also needed her prescriptions refilled. Stated that she needed to be admitted due to being sick. Explained to the patient that I was aware of the situation that took place with her this am and asked her multiple times if she needed to be seen. She stated that she needed to be established with an internal medicine physician and I explained that I could let her speak to CSM or LCSW and she stated that she did not need to see either one of those. She stated that she needed a dose of Solu-Medrol and IVF and would we give that to her. I explained that those decisions would up to the EDP and she stated that she wanted to see the EDP that she had last pm. I explained to her that he was not in the ER due to him working night shift. She stated that she wanted a cup a coffee and go to the bathroom to think about whether she needed to stay or not. I gave her a cup a coffee as asked. Patient again started asking the same questions. Informed her that if she wanted to be seen that she needed to check in. She stated that she was still not sure. Patient was very aware of her condition as she stated that the EDP last pm explained that to her.

## 2011-09-26 ENCOUNTER — Encounter (HOSPITAL_COMMUNITY): Payer: Self-pay | Admitting: Emergency Medicine

## 2011-09-26 ENCOUNTER — Emergency Department (HOSPITAL_COMMUNITY)
Admission: EM | Admit: 2011-09-26 | Discharge: 2011-09-27 | Disposition: A | Discharge status: Home / Self Care | Payer: BC Managed Care – PPO | Attending: Emergency Medicine | Admitting: Emergency Medicine

## 2011-09-26 DIAGNOSIS — F17200 Nicotine dependence, unspecified, uncomplicated: Secondary | ICD-10-CM | POA: Insufficient documentation

## 2011-09-26 DIAGNOSIS — F419 Anxiety disorder, unspecified: Secondary | ICD-10-CM

## 2011-09-26 DIAGNOSIS — Z79899 Other long term (current) drug therapy: Secondary | ICD-10-CM | POA: Insufficient documentation

## 2011-09-26 DIAGNOSIS — G35 Multiple sclerosis: Secondary | ICD-10-CM | POA: Insufficient documentation

## 2011-09-26 DIAGNOSIS — R4189 Other symptoms and signs involving cognitive functions and awareness: Secondary | ICD-10-CM

## 2011-09-26 DIAGNOSIS — F411 Generalized anxiety disorder: Secondary | ICD-10-CM | POA: Insufficient documentation

## 2011-09-26 DIAGNOSIS — F319 Bipolar disorder, unspecified: Secondary | ICD-10-CM | POA: Insufficient documentation

## 2011-09-26 HISTORY — DX: Bipolar disorder, unspecified: F31.9

## 2011-09-26 LAB — COMPREHENSIVE METABOLIC PANEL
Albumin: 4.3 g/dL (ref 3.5–5.2)
Alkaline Phosphatase: 79 U/L (ref 39–117)
BUN: 5 mg/dL — ABNORMAL LOW (ref 6–23)
Creatinine, Ser: 0.5 mg/dL (ref 0.50–1.10)
GFR calc Af Amer: >90 mL/min (ref >90)
Glucose, Bld: 102 mg/dL — ABNORMAL HIGH (ref 70–99)
Potassium: 3.1 mEq/L — ABNORMAL LOW (ref 3.5–5.1)
Total Protein: 7.1 g/dL (ref 6.0–8.3)

## 2011-09-26 LAB — RAPID URINE DRUG SCREEN, HOSP PERFORMED
Amphetamines: NOT DETECTED
Cocaine: NOT DETECTED
Opiates: NOT DETECTED

## 2011-09-26 LAB — CBC WITH DIFFERENTIAL/PLATELET
Basophils Relative: 1 % (ref 0–1)
Eosinophils Absolute: 0 10*3/uL (ref 0.0–0.7)
Eosinophils Relative: 0 % (ref 0–5)
HCT: 37.7 % (ref 36.0–46.0)
Hemoglobin: 13.3 g/dL (ref 12.0–15.0)
Lymphs Abs: 1.2 10*3/uL (ref 0.7–4.0)
MCH: 32.4 pg (ref 26.0–34.0)
MCHC: 35.3 g/dL (ref 30.0–36.0)
MCV: 92 fL (ref 78.0–100.0)
Monocytes Absolute: 0.8 10*3/uL (ref 0.1–1.0)
Monocytes Relative: 10 % (ref 3–12)
Neutrophils Relative %: 77 % (ref 43–77)
RBC: 4.1 MIL/uL (ref 3.87–5.11)

## 2011-09-26 MED ORDER — ACETAMINOPHEN 325 MG PO TABS
650.0000 mg | ORAL_TABLET | Freq: Once | ORAL | Status: AC
  Administered 2011-09-27: 650 mg via ORAL
  Filled 2011-09-26: qty 1
  Filled 2011-09-26 (×2): qty 2
  Filled 2011-09-26: qty 1

## 2011-09-26 MED ORDER — LORAZEPAM 1 MG PO TABS
2.0000 mg | ORAL_TABLET | Freq: Once | ORAL | Status: AC
  Administered 2011-09-27: 2 mg via ORAL
  Filled 2011-09-26 (×2): qty 2

## 2011-09-26 NOTE — ED Provider Notes (Signed)
History     CSN: [id number]  Arrival date & time 09/26/11  1191   First MD Initiated Contact with Patient 09/26/11 2008      Chief Complaint  Patient presents with  . Medical Clearance    (Consider location/radiation/quality/duration/timing/severity/associated sxs/prior treatment) HPI Comments: Patient brought by authorities for eval of behavioral problems.  She has a history of MS and anxiety according to her chart.  Her ex-husband filed an involuntary commitment against her.  She tells me she was brought here for making phone calls to 911.    She is very uncooperative and evasive with any questions I ask her.  Further history is unable to be obtained due to this.  The history is provided by the patient.    Past Medical History  Diagnosis Date  . Multiple sclerosis   . Arthritis   . Anxiety   . Bipolar disorder     Past Surgical History  Procedure Date  . Cesarean section     History reviewed. No pertinent family history.  History  Substance Use Topics  . Smoking status: Current Everyday Smoker -- 1.0 packs/day for 20 years    Types: Cigarettes  . Smokeless tobacco: Never Used  . Alcohol Use: 2.0 oz/week    4 drink(s) per week     occasional    OB History    Grav Para Term Preterm Abortions TAB SAB Ect Mult Living                  Review of Systems  All other systems reviewed and are negative.    Allergies  Review of patient's allergies indicates no known allergies.  Home Medications   Current Outpatient Rx  Name Route Sig Dispense Refill  . BUSPIRONE HCL 10 MG PO TABS Oral Take 10 mg by mouth 3 (three) times daily.    Marland Kitchen LISDEXAMFETAMINE DIMESYLATE 50 MG PO CAPS Oral Take 50 mg by mouth every morning.    Marland Kitchen MELOXICAM 15 MG PO TABS Oral Take 15 mg by mouth daily.    Marland Kitchen MODAFINIL 200 MG PO TABS Oral Take 200 mg by mouth daily.     Marland Kitchen PREGABALIN 75 MG PO CAPS Oral Take 75 mg by mouth 2 (two) times daily.      BP 116/55  Pulse 86  Temp 99.1 F (37.3  C) (Oral)  Resp 16  SpO2 97%  Physical Exam  Nursing note and vitals reviewed. Constitutional: She is oriented to person, place, and time. She appears well-developed and well-nourished. No distress.  HENT:  Head: Normocephalic and atraumatic.  Neck: Normal range of motion. Neck supple.  Neurological: She is alert and oriented to person, place, and time.  Skin: Skin is warm and dry. She is not diaphoretic.  Psychiatric: Her mood appears anxious. Her affect is labile. Her speech is rapid and/or pressured. She is agitated. She expresses impulsivity.    ED Course  Procedures (including critical care time)  Labs Reviewed  COMPREHENSIVE METABOLIC PANEL - Abnormal; Notable for the following:    Sodium 128 (*)     Potassium 3.1 (*)     Chloride 91 (*)     Glucose, Bld 102 (*)     BUN 5 (*)     All other components within normal limits  ETHANOL  CBC WITH DIFFERENTIAL  URINE RAPID DRUG SCREEN (HOSP PERFORMED)   No results found.   No diagnosis found.    MDM  The patient presents with the authorities for  evaluation of erratic behavior under involuntary commitment by her husband.  She will not cooperate with my interview and her answers to my questions are evasive.  The police officer with her is not familiar of what had transpired prior to her arrival here and the patient refuses to tell the officer her address so that she can obtain that information.  I will consult act for evaluation.  Patient sent to Psych ER pending placement.  Care signed out to oncoming physician.        Geoffery Lyons, MD 09/27/11 [phone]

## 2011-09-26 NOTE — ED Notes (Signed)
Here with [employer]iff's dept. IVC papers. Has been calling 911 multiple times, has called ex- husband multiple times, he took IVC papers out on pt.

## 2011-09-26 NOTE — ED Notes (Signed)
Pt offered sandwich, juice, crackers or chips. Pt requesting cake or honeybun, explained to pt we do not have those items. Pt refused to answer if she would like items offered.

## 2011-09-26 NOTE — ED Notes (Signed)
Patient has been transferred to room 31. Very talkative. Police has visited. Twice. Patient remains addemien

## 2011-09-26 NOTE — ED Notes (Signed)
Patient has been assigned a Comptroller. Patient is agitated and irritated. Manipulating the conversation. Reassure patients that she will be cared for and patient continues to feel has she was being unfairly treated.
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
## 2011-09-27 DIAGNOSIS — F09 Unspecified mental disorder due to known physiological condition: Secondary | ICD-10-CM

## 2011-09-27 MED ORDER — MELOXICAM 15 MG PO TABS
15.0000 mg | ORAL_TABLET | Freq: Every day | ORAL | Status: DC
  Administered 2011-09-27: 15 mg via ORAL
  Filled 2011-09-27: qty 1

## 2011-09-27 MED ORDER — LISDEXAMFETAMINE DIMESYLATE 30 MG PO CAPS: 50.0000 mg | ORAL_CAPSULE | ORAL | Status: DC

## 2011-09-27 MED ORDER — BUSPIRONE HCL 10 MG PO TABS
10.0000 mg | ORAL_TABLET | Freq: Three times a day (TID) | ORAL | Status: DC
  Filled 2011-09-27: qty 1

## 2011-09-27 MED ORDER — LISDEXAMFETAMINE DIMESYLATE 30 MG PO CAPS
50.0000 mg | ORAL_CAPSULE | ORAL | Status: DC
  Administered 2011-09-27: 50 mg via ORAL
  Filled 2011-09-27: qty 1

## 2011-09-27 MED ORDER — MODAFINIL 200 MG PO TABS
200.0000 mg | ORAL_TABLET | Freq: Every day | ORAL | Status: DC
  Administered 2011-09-27: 200 mg via ORAL
  Filled 2011-09-27: qty 1

## 2011-09-27 MED ORDER — PREGABALIN 50 MG PO CAPS
75.0000 mg | ORAL_CAPSULE | Freq: Two times a day (BID) | ORAL | Status: DC
  Filled 2011-09-27: qty 3

## 2011-09-27 NOTE — ED Notes (Signed)
Pt discharged to home with mother at bedside.  Patient has all belongings.  Discussed discharge instructions - verbalized understanding.  No other concerns at this time.  Anne Black 6:13 PM 09/27/2011
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
## 2011-09-27 NOTE — Consult Note (Signed)
Reason for Consult: Cognitive deficits, relational, behavior. Referring Physician: Dr. Maximiano Black is an 51 y.o. female.  HPI: Patient was seen and chart reviewed. Ppatient was known to this provider from her previous encounter at [facility] long emergency department. Patient has been suffering with the chronic multiple sclerosis, fibromyalgia, and anxiety disorder. Patient also has a documented diagnosis of bipolar disorder. Patient was receiving medication management from outpatient physician. She was brought in by [name] for involuntary petition filed by her ex-husband. Reportedly patient has been increasingly distressed, distraught and unable to cope with the stress of custody battle with her husband. Patient has been worried and anxious, because she never been without her children. Now she has to go to the court to battle with her ex-husband, who has been keeps sending her to the emergency department with the IVC. Patient has difficulty providing lienear and goal-directed history. Patient has been choppy in her information and repeat herself on several times. Patient denies symptoms of depression, anxiety, psychosis, suicidal and homicidal ideation, intention, or plan. Patient has no homicidal ideation, intention, or plan. Patient requested to be discharged with outpatient psych treatment. Patient urine drug screen was negative for drug of abuse and alcoholism   Past Medical History  Diagnosis Date  . Multiple sclerosis   . Arthritis   . Anxiety   . Bipolar disorder     Past Surgical History  Procedure Date  . Cesarean section     History reviewed. No pertinent family history.  Social History:  reports that she has been smoking Cigarettes.  She has a 20 pack-year smoking history. She has never used smokeless tobacco. She reports that she drinks about 2 ounces of alcohol per week. She reports that she does not use illicit drugs.  Allergies: No Known  Allergies  Medications: I have reviewed the patient's current medications.  Results for orders placed during the hospital encounter of 09/26/11 (from the past 48 hour(s))  URINE RAPID DRUG SCREEN (HOSP PERFORMED)     Status: Normal   Collection Time   09/26/11  6:49 PM      Component Value Range Comment   Opiates NONE DETECTED  NONE DETECTED    Cocaine NONE DETECTED  NONE DETECTED    Benzodiazepines NONE DETECTED  NONE DETECTED    Amphetamines NONE DETECTED  NONE DETECTED    Tetrahydrocannabinol NONE DETECTED  NONE DETECTED    Barbiturates NONE DETECTED  NONE DETECTED   ETHANOL     Status: Normal   Collection Time   09/26/11  6:59 PM      Component Value Range Comment   Alcohol, Ethyl (B) <11  0 - 11 mg/dL   CBC WITH DIFFERENTIAL     Status: Normal   Collection Time   09/26/11  6:59 PM      Component Value Range Comment   WBC 8.7  4.0 - 10.5 K/uL    RBC 4.10  3.87 - 5.11 MIL/uL    Hemoglobin 13.3  12.0 - 15.0 g/dL    HCT 16.1  09.6 - 04.5 %    MCV 92.0  78.0 - 100.0 fL    MCH 32.4  26.0 - 34.0 pg    MCHC 35.3  30.0 - 36.0 g/dL    RDW 40.9  81.1 - 91.4 %    Platelets 289  150 - 400 K/uL    Neutrophils Relative 77  43 - 77 %    Neutro Abs 6.7  1.7 -  7.7 K/uL    Lymphocytes Relative 13  12 - 46 %    Lymphs Abs 1.2  0.7 - 4.0 K/uL    Monocytes Relative 10  3 - 12 %    Monocytes Absolute 0.8  0.1 - 1.0 K/uL    Eosinophils Relative 0  0 - 5 %    Eosinophils Absolute 0.0  0.0 - 0.7 K/uL    Basophils Relative 1  0 - 1 %    Basophils Absolute 0.1  0.0 - 0.1 K/uL   COMPREHENSIVE METABOLIC PANEL     Status: Abnormal   Collection Time   09/26/11  6:59 PM      Component Value Range Comment   Sodium 128 (*) 135 - 145 mEq/L    Potassium 3.1 (*) 3.5 - 5.1 mEq/L    Chloride 91 (*) 96 - 112 mEq/L    CO2 28  19 - 32 mEq/L    Glucose, Bld 102 (*) 70 - 99 mg/dL    BUN 5 (*) 6 - 23 mg/dL    Creatinine, Ser 9.60  0.50 - 1.10 mg/dL    Calcium 9.3  8.4 - 45.4 mg/dL    Total Protein 7.1  6.0  - 8.3 g/dL    Albumin 4.3  3.5 - 5.2 g/dL    AST 23  0 - 37 U/L    ALT 23  0 - 35 U/L    Alkaline Phosphatase 79  39 - 117 U/L    Total Bilirubin 0.3  0.3 - 1.2 mg/dL    GFR calc non Af Amer >90  >90 mL/min    GFR calc Af Amer >90  >90 mL/min   MRSA PCR SCREENING     Status: Normal   Collection Time   09/27/11 12:30 PM      Component Value Range Comment   MRSA by PCR NEGATIVE  NEGATIVE     No results found.  No depression, No psychosis and Positive for anxiety, bad mood, learning difficulty, separation anxiety and sleep disturbance Blood pressure 119/79, pulse 90, temperature 98.3 F (36.8 C), temperature source Oral, resp. rate 16, SpO2 100.00%.   Assessment/Plan: Cognitive disorder due to other medical conditions Fibromyalgia Multiple sclerosis  Patient does not meet criteria for acute psychiatric hospitalization and patient does not have a signs and symptoms of for danger to herself or others. Based on her behaviors, who unable to function alone. I would recommend out-of-home assisted-living facility. Patient also needed consultation with the neurologist at [facility] for possible flareup on multiple sclerosis. No medication recommendations given at this time. No medication changes were made during this visit. Patient may be discharged upon medically stable.  Anne Black,Anne R. 09/27/2011, 4:44 PM

## 2011-09-27 NOTE — ED Notes (Signed)
Came to patient bedside with medication and patient was sleeping. Will return medication and administer meds when needed.

## 2011-09-27 NOTE — ED Notes (Signed)
MD at bedside. 

## 2011-09-27 NOTE — Progress Notes (Signed)
Pt states she goes to Urgent care center at [facility] but unable to provider a pcp name

## 2011-09-27 NOTE — BHH Counselor (Signed)
Patient evaluated by psychiatrist Dr. Elsie Saas. He notes that patient does not meet criteria for acute psychiatric hospitalization and patient does not have a signs and symptoms of for danger to herself or others. Based on her behaviors, who unable to function alone. I would recommend out-of-home assisted-living facility. Patient also needed consultation with the neurologist at [facility] for possible flareup on multiple sclerosis. No medication recommendations given at this time. No medication changes were made during this visit. Patient may be discharged upon medically stable. Writer contacted EDP-Dr. Juleen China and made him aware of patient's disposition and recommendations by Dr. Henrene Hawking. Patient's nurse was also made aware of patient's disposition. He agreed to discharge patient home. Writer provided patient with the appropriate out-patient referrals to local therapist and psychiatrist in the community. Patient also given the contact information to mental health and mobile crises for emergency's as needed. Patient agreeable to follow up. Writer left patient's referrals at the nursing station for patient's nurse to attach to patient's discharge paperwork.

## 2011-09-27 NOTE — ED Notes (Signed)
Informed Psych MD about patient's desire to see a neurologist for multiple sclerosis symptoms.
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
## 2011-10-09 ENCOUNTER — Ambulatory Visit (INDEPENDENT_AMBULATORY_CARE_PROVIDER_SITE_OTHER): Payer: BC Managed Care – PPO | Admitting: Family Medicine

## 2011-10-09 VITALS — BP 110/69 | HR 90 | Temp 98.7°F | Resp 18 | Ht 61.5 in | Wt 123.0 lb

## 2011-10-09 DIAGNOSIS — G35 Multiple sclerosis: Secondary | ICD-10-CM

## 2011-10-09 DIAGNOSIS — K219 Gastro-esophageal reflux disease without esophagitis: Secondary | ICD-10-CM

## 2011-10-09 DIAGNOSIS — J40 Bronchitis, not specified as acute or chronic: Secondary | ICD-10-CM

## 2011-10-09 MED ORDER — DULOXETINE HCL 30 MG PO CPEP: 30.0000 mg | ORAL_CAPSULE | Freq: Every day | ORAL | Status: DC

## 2011-10-09 MED ORDER — AZITHROMYCIN 250 MG PO TABS: ORAL_TABLET | ORAL | Status: AC

## 2011-10-09 MED ORDER — METHYLPREDNISOLONE SODIUM SUCC 125 MG IJ SOLR: 125.0000 mg | Freq: Once | INTRAMUSCULAR | Status: DC

## 2011-10-09 MED ORDER — ESOMEPRAZOLE MAGNESIUM 40 MG PO CPDR: 40.0000 mg | DELAYED_RELEASE_CAPSULE | Freq: Every day | ORAL | Status: DC

## 2011-10-09 MED ORDER — METHYLPREDNISOLONE SODIUM SUCC 125 MG IJ SOLR
125.0000 mg | Freq: Once | INTRAMUSCULAR | Status: AC
  Administered 2011-10-09: 125 mg via INTRAVENOUS

## 2011-10-09 NOTE — Progress Notes (Signed)
51 yo woman with MS for more than 10 years.  She feels that her MS has been "flaring" for more than a month and she heard that Cymbalta.  Patient has seen many neurologists. Patient coughs intermittently  Current symptoms:  Back pain (burning), lower legs burning, fatigue, nausea, GERD  Sig Negs:  No double vision  Objective: NAD Alert but pauses in between responses to my questions Eyes:  Normal Neck: supple, adenopathy Chest:  Clear, but has congested cough Heart:  Reg, no murmur  Assessment:  51 yo with chronic meds needing refills which I don't feel comfortable doing.  She seems to have scattered thinking.  Patient is not cooperative and noncompliant.  Plan: 1. Multiple sclerosis  esomeprazole (NEXIUM) 40 MG capsule, DULoxetine (CYMBALTA) 30 MG capsule, methylPREDNISolone sodium succinate (SOLU-MEDROL) 125 mg/2 mL injection 125 mg, DISCONTINUED: methylPREDNISolone sodium succinate (SOLU-MEDROL) 125 mg/2 mL injection  2. GERD (gastroesophageal reflux disease)  esomeprazole (NEXIUM) 40 MG capsule  3. Bronchitis  azithromycin (ZITHROMAX Z-PAK) 250 MG tablet

## 2011-10-09 NOTE — Progress Notes (Signed)
During placement of IV patient stated that she wanted to leave and go to the ER so she could get her "medications." Several times she changed her mind between stating she was going to leave, and then that she would stay for her IV medication. When I asked her to please make a decision she stated I was unprofessional but that she would stay. She did threaten to leave several more times during placement of the IV. Jaymes Graff, CMA was present throughout the encounter.   Patient seemed to be very ambivalent about what she wanted and what she didn't.  I explained that we do not prescribe Vyvanse and Provigil for patients like her.

## 2011-10-12 ENCOUNTER — Telehealth: Payer: Self-pay

## 2011-10-12 NOTE — Telephone Encounter (Signed)
Pt states she did not understand message.  Wants to know if we are calling an oral treatment into a pharmacy.  [facility] [place] is okay, she will have it transferred to where she is.   Pt does not know her phone number (captured on phone [phone])

## 2011-10-12 NOTE — Telephone Encounter (Signed)
Pt had LM on nurse VM which was very difficult to understand d/t pt slurring words, but was finally able to determine who had left the message and called pt back. Pt is requesting further tx w/IV solumedrol or oral meds until she can see her neurologist. Dr Milus Glazier stated that we need to advise pt (as he did at [facility]) that we are a family practice and these txs need to be managed by her neurologist or pt needs to go to the ER where they can have a neurologist consult on tx.  Tried to call pt back, but VM was full and could not LM.

## 2011-10-13 NOTE — Telephone Encounter (Signed)
Explained Dr Cain Saupe message to pt again that she needs to consult her neurologist and/or ER for add'l tx.

## 2011-10-22 ENCOUNTER — Other Ambulatory Visit: Payer: Self-pay | Admitting: Family Medicine

## 2011-10-22 DIAGNOSIS — G35 Multiple sclerosis: Secondary | ICD-10-CM

## 2011-10-22 MED ORDER — DULOXETINE HCL 30 MG PO CPEP: 30.0000 mg | ORAL_CAPSULE | Freq: Every day | ORAL | Status: DC

## 2011-10-22 NOTE — Telephone Encounter (Signed)
Changed pt's RFs of Cymbalta to [facility] as requested on VM by pt. After asking Dr L about Rxing an NSAID, called an [facility] for pt that I changed her RFs on Cymbalta, but Dr L is not comfortable Rxing any other meds for her d/t drug interactions w/ her Rxs from neurologist. Advised pt to have her neurologist manage her medications, or PCP.

## 2011-10-22 NOTE — Addendum Note (Signed)
Addended by: Sheppard Plumber A on: 10/22/2011 04:43 PM   Modules accepted: Orders

## 2011-10-22 NOTE — Addendum Note (Signed)
Addended by: Sheppard Plumber A on: 10/22/2011 04:45 PM   Modules accepted: Orders

## 2011-10-22 NOTE — Telephone Encounter (Signed)
Pt called and LM on nurse VM asking for her Rx RFs for Cymbalta to be sent to [facility] for her 90 days at a time. Dr L, I can move the RFs you had given her to [facility] for the Cymbalta. Pt also asked that we send in a Rx for a NSAID for her like meloxicam, 30 day to [facility] and 90 day to [facility]. Do you want to do this?

## 2011-10-25 ENCOUNTER — Other Ambulatory Visit (HOSPITAL_COMMUNITY): Payer: Self-pay | Admitting: Internal Medicine

## 2011-10-25 ENCOUNTER — Other Ambulatory Visit: Payer: Self-pay | Admitting: Family Medicine

## 2011-10-25 MED ORDER — MODAFINIL 200 MG PO TABS: 200.0000 mg | ORAL_TABLET | Freq: Every day | ORAL | Status: DC

## 2011-10-27 ENCOUNTER — Other Ambulatory Visit: Payer: Self-pay | Admitting: Physician Assistant

## 2011-10-30 ENCOUNTER — Ambulatory Visit (INDEPENDENT_AMBULATORY_CARE_PROVIDER_SITE_OTHER): Payer: BC Managed Care – PPO | Admitting: Family Medicine

## 2011-10-30 VITALS — BP 122/81 | HR 81 | Temp 98.1°F | Resp 16 | Ht 62.25 in | Wt 126.0 lb

## 2011-10-30 DIAGNOSIS — F32A Depression, unspecified: Secondary | ICD-10-CM

## 2011-10-30 DIAGNOSIS — G35D Multiple sclerosis, unspecified: Secondary | ICD-10-CM

## 2011-10-30 DIAGNOSIS — F329 Major depressive disorder, single episode, unspecified: Secondary | ICD-10-CM

## 2011-10-30 DIAGNOSIS — F411 Generalized anxiety disorder: Secondary | ICD-10-CM

## 2011-10-30 DIAGNOSIS — F319 Bipolar disorder, unspecified: Secondary | ICD-10-CM

## 2011-10-30 DIAGNOSIS — G629 Polyneuropathy, unspecified: Secondary | ICD-10-CM

## 2011-10-30 DIAGNOSIS — F419 Anxiety disorder, unspecified: Secondary | ICD-10-CM

## 2011-10-30 DIAGNOSIS — G35 Multiple sclerosis: Secondary | ICD-10-CM

## 2011-10-30 MED ORDER — PREDNISONE 20 MG PO TABS: ORAL_TABLET | ORAL | Status: DC

## 2011-10-30 NOTE — Progress Notes (Signed)
[address]   [place], [place]  [address]   [phone]  Subjective:    Patient ID: Anne Black, female    DOB: [birth year], 51 y.o.   MRN: [id number]  HPIThis 51 y.o. female presents for evaluation of the following:  1.  Multiple sclerosis:  Between neurologists currently; behind on some things; needs Family Practitioner with extended hours including weekends.  Looking for someone who is open on weekends.  Was going to start with female neurologist with [facility]; neurologist not seeing MS patients.  Previously taking medication/chemo agent.  Has been off of medication. Has declined neurologically since stopping medication.  Connections are slower.  A lot of pressure currently that pt must deal with.  Linus Galas was previous medication.  Dr. Arlice Colt at [facility] was coordinating administration of Avantra; was previously seeing [employer]; was some issues with office.  New medication that pt heard about in medical magazine.  Considering  [facility] versus [facility].   Having neuropathy issues.  Has not been medically well.   Has been hospitalized for sodium, potassium imbalance recently.   First diagnosed age 81 with MS.  Last neurology visit 07/2011 [facility].  Previously followed by Dr. Leotis Shames in [facility].  Does not desire to return to [facility] due to drive.  Neuropathy has really affected overall neurological well-being.   At last visit at this visit, was given iv steroid by Dr. Milus Glazier; felt much better for several days after steroid infusion; really had more energy and overall well-being with infusion.  Today in a lot of pain due to neuropathy.  Requesting iv Solumedrol; has suffered with stomach upset/GERD thus not sure if can tolerate po Prednisone taper.  Connections are slower.  Decreased stamina but no focal weakness.   No difficulties speaking; no balance issues.    2. Depression: going through a lot of sadness and grief.  Some major stressors with  children; has triplets (19) and twins (12).  Was a single mom.  A lot of sadness with triplets.  Left to live with father; now twins have had some issues.  Has been an attentive mother.  Some struggles and changes.  Adapting well.  Ex husband has gotten temporary custody of twins.  Dysfunction within family unit currently.  Really sad; "legal system is not always right".  Triplets returned home recently.  Recent beach trip with triplets, twins; a lot of disrespect from triplets.  Lots of drama lately.  Going to court and not prepared; had attorney who was not very good.  Lots of adjustments.  Shocked with custody decision; has always been involved at school; recently lost custody of twins 52 yo.   Triplets took pictures of poor state of patient's house.  Allogations that triplets had to clean for patient.   Has 4000 square foot house; twins had not cleaned up house.  Had been to beach.  Court made a big deal.  House dirty.  All of triplets belongings still in home; contributing to clutter.   [employer] in past; father was alcoholic; only child; admitted at [facility] as adolescent.  +brain lesion on MRI during mental health admission during adolsecence.  Pt denies Bipoloar diagnosis but admits to depression.  Not being treated as such.  No current psychiatrist.  Ex-husband has had patient involuntarily committed x 2.   Last involuntarily committed in 07/2011.  Has not follow-up with psychiatry since discharge.  Dr. Nelda Severe, previous PCP, has been giving Vyvanse;  no longer followed by [facility] due to limited hours; no weekend availabilities.   No Vyvanse for several weeks.   Has permanent trial in one month regarding custody of twins.  No SI/HI.  Needs some help. Started on Cymbalta for anxiety/depression and neuropathy at last visit; would like to start back Wellbutrin which seemed to help depression a lot better.  Also wanting Vyvanse rx; unable to focus well recently.  3.  Neuropathy: diagnosed  with neuropathy by previous physicians and neurologist.  Started on Cymbalta 30mg  daily at last visit with Dr. Milus Glazier.  No previous NCS or EMG studies.  Suffers with a lot of pain.    4.  Vitamin B12 deficiency: patient requesting B12 injection; previous PCP administered B12 injections for patient.  Would like to transfer care to [facility] due to extended hours.    Review of Systems  Constitutional: Positive for fatigue. Negative for fever, chills and diaphoresis.  Eyes: Negative for photophobia and visual disturbance.  Respiratory: Negative for shortness of breath and wheezing.   Cardiovascular: Negative for chest pain, palpitations and leg swelling.  Gastrointestinal: Negative for nausea, vomiting and abdominal pain.  Musculoskeletal: Negative for myalgias, back pain, joint swelling, arthralgias and gait problem.  Skin: Negative for color change, pallor, rash and wound.  Neurological: Positive for numbness. Negative for dizziness, tremors, syncope, facial asymmetry, speech difficulty, weakness, light-headedness and headaches.  Psychiatric/Behavioral: Positive for behavioral problems, dysphoric mood, decreased concentration and agitation. Negative for suicidal ideas, confusion and self-injury. The patient is nervous/anxious and is hyperactive.         Past Medical History  Diagnosis Date  . Multiple sclerosis   . Arthritis   . Anxiety   . Bipolar disorder     Past Surgical History  Procedure Date  . Cesarean section     Prior to Admission medications   Medication Sig Start Date End Date Taking? Authorizing Provider  busPIRone (BUSPAR) 10 MG tablet Take 10 mg by mouth 3 (three) times daily as needed.  08/19/11 08/18/12 Yes Gwyneth Sprout, MD  DULoxetine (CYMBALTA) 30 MG capsule Take 1 capsule (30 mg total) by mouth daily. 10/22/11 10/21/12 Yes Elvina Sidle, MD  esomeprazole (NEXIUM) 40 MG capsule Take 1 capsule (40 mg total) by mouth daily. 10/09/11 10/08/12 Yes Elvina Sidle, MD    gabapentin (NEURONTIN) 300 MG capsule Take 300 mg by mouth 3 (three) times daily as needed.    Yes Historical Provider, MD  lamoTRIgine (LAMICTAL) 150 MG tablet Take 150 mg by mouth daily as needed.    Yes Historical Provider, MD  modafinil (PROVIGIL) 200 MG tablet Take 1 tablet (200 mg total) by mouth daily. 10/25/11  Yes Ryan M Dunn, PA-C  pregabalin (LYRICA) 75 MG capsule Take 75 mg by mouth 2 (two) times daily as needed.  08/19/11 08/18/12 Yes Gwyneth Sprout, MD  lisdexamfetamine (VYVANSE) 50 MG capsule Take 50 mg by mouth every morning.    Historical Provider, MD    No Known Allergies  History   Social History  . Marital Status: Divorced    Spouse Name: N/A    Number of Children: 5  . Years of Education: N/A   Occupational History  .      on social security disability for MS   Social History Main Topics  . Smoking status: Current Every Day Smoker -- 1.0 packs/day for 20 years    Types: Cigarettes  . Smokeless tobacco: Never Used  . Alcohol Use: 2.0 oz/week    4 drink(s)  per week     occasional  . Drug Use: No  . Sexually Active: Not Currently   Other Topics Concern  . Not on file   Social History Narrative  . No narrative on file    No family history on file.  Objective:   Physical Exam  Nursing note and vitals reviewed. Constitutional: She is oriented to person, place, and time. She appears well-developed and well-nourished. No distress.  HENT:  Head: Normocephalic and atraumatic.  Eyes: Conjunctivae normal are normal. Pupils are equal, round, and reactive to light.  Neck: Normal range of motion. Neck supple. No thyromegaly present.  Cardiovascular: Normal rate, regular rhythm, normal heart sounds and intact distal pulses.   Pulmonary/Chest: Effort normal and breath sounds normal.  Musculoskeletal: Normal range of motion. She exhibits no edema and no tenderness.  Lymphadenopathy:    She has no cervical adenopathy.  Neurological: She is alert and oriented to  person, place, and time. No cranial nerve deficit. She exhibits normal muscle tone. Coordination normal.  Skin: Skin is warm and dry. She is not diaphoretic.  Psychiatric: Thought content normal. Her mood appears anxious. Her affect is labile. Her speech is tangential. She is is hyperactive. Cognition and memory are normal. She expresses impulsivity.       Assessment & Plan:   1. Multiple sclerosis  predniSONE (DELTASONE) 20 MG tablet, Ambulatory referral to Neurology  2. Peripheral neuropathy    3. Depression    4. Anxiety    5. Bipolar disorder      1. Multiple Sclerosis: Worsening due to non-compliance with neurological follow-up.  Refer to neurology.  Refused to provide iv infusion of Solumedrol.  Agreeable to Prednisone taper orally.  Also advised patient that iv Solumedrol in [facility] setting not appropriate management of MS.  Neurologist strictlyl manage MS and this facility will not provide further iv Solumedrol for patient's management of MS symptoms.   2.  Peripheral Neuropathy: worsening per patient.  Unclear who is currently managing neuropathy for patient but should be managed by neurology.  Can benefit from increase in Cymbalta and Neurontin doses but will not make adjustments to medications today since referring to neurology.  3.  Bipolar Disorder:  Uncontrolled. Advised patient that her polypharmacy for psychiatric symptoms are best managed by psychiatry.  I am not comfortable prescribing various psychiatric medications especially considering recent mental health admission. Not appropriate for family practitioner to manage psychiatric diagnoses.  Patient declined referral to psychiatry.    Advised patient that she should seek primary care elsewhere due to complexity of medical comorbidities; she needs to establish with PCP who will see her consistently and scheduled.  Will not make adjustments to any medications at this time; will not prescribe Wellbutrin or Vyvanse.   Face  to face encounter with patient: 40 minutes (with over 50% of time dedicated to counseling, coordination of care).

## 2011-10-31 ENCOUNTER — Encounter (HOSPITAL_COMMUNITY): Payer: Self-pay | Admitting: Emergency Medicine

## 2011-10-31 ENCOUNTER — Inpatient Hospital Stay (HOSPITAL_COMMUNITY)
Admission: EM | Admit: 2011-10-31 | Discharge: 2011-11-02 | DRG: 641 | Disposition: A | Discharge status: Home / Self Care | Payer: Medicare Other | Attending: Internal Medicine | Admitting: Internal Medicine

## 2011-10-31 DIAGNOSIS — F329 Major depressive disorder, single episode, unspecified: Secondary | ICD-10-CM | POA: Diagnosis present

## 2011-10-31 DIAGNOSIS — D649 Anemia, unspecified: Secondary | ICD-10-CM

## 2011-10-31 DIAGNOSIS — E876 Hypokalemia: Secondary | ICD-10-CM

## 2011-10-31 DIAGNOSIS — G35 Multiple sclerosis: Secondary | ICD-10-CM | POA: Diagnosis present

## 2011-10-31 DIAGNOSIS — K5289 Other specified noninfective gastroenteritis and colitis: Secondary | ICD-10-CM | POA: Diagnosis present

## 2011-10-31 DIAGNOSIS — F411 Generalized anxiety disorder: Secondary | ICD-10-CM | POA: Diagnosis present

## 2011-10-31 DIAGNOSIS — R6251 Failure to thrive (child): Secondary | ICD-10-CM

## 2011-10-31 DIAGNOSIS — G35D Multiple sclerosis, unspecified: Secondary | ICD-10-CM

## 2011-10-31 DIAGNOSIS — E871 Hypo-osmolality and hyponatremia: Secondary | ICD-10-CM

## 2011-10-31 DIAGNOSIS — R4182 Altered mental status, unspecified: Secondary | ICD-10-CM

## 2011-10-31 DIAGNOSIS — F3289 Other specified depressive episodes: Secondary | ICD-10-CM | POA: Diagnosis present

## 2011-10-31 DIAGNOSIS — R112 Nausea with vomiting, unspecified: Secondary | ICD-10-CM | POA: Diagnosis present

## 2011-10-31 DIAGNOSIS — K219 Gastro-esophageal reflux disease without esophagitis: Secondary | ICD-10-CM

## 2011-10-31 NOTE — ED Notes (Signed)
Pt alert, arrives from home, states "I want my nausea medication", pt denies pain, pt states "i want to go [facility]", resp even unlabored, skin pwd

## 2011-11-01 ENCOUNTER — Encounter (HOSPITAL_COMMUNITY): Payer: Self-pay

## 2011-11-01 DIAGNOSIS — D649 Anemia, unspecified: Secondary | ICD-10-CM

## 2011-11-01 LAB — BASIC METABOLIC PANEL
BUN: 10 mg/dL (ref 6–23)
CO2: 25 mEq/L (ref 19–32)
Calcium: 9 mg/dL (ref 8.4–10.5)
Chloride: 87 mEq/L — ABNORMAL LOW (ref 96–112)
Chloride: 97 mEq/L (ref 96–112)
Chloride: 105 mEq/L (ref 96–112)
GFR calc Af Amer: >90 mL/min (ref >90)
GFR calc non Af Amer: >90 mL/min (ref >90)
Glucose, Bld: 90 mg/dL (ref 70–99)
Potassium: 3.4 mEq/L — ABNORMAL LOW (ref 3.5–5.1)
Potassium: 4 mEq/L (ref 3.5–5.1)
Potassium: 3.7 mEq/L (ref 3.5–5.1)
Sodium: 121 mEq/L — ABNORMAL LOW (ref 135–145)
Sodium: 130 mEq/L — ABNORMAL LOW (ref 135–145)

## 2011-11-01 LAB — CBC
MCH: 32.4 pg (ref 26.0–34.0)
MCV: 87.8 fL (ref 78.0–100.0)
Platelets: 219 10*3/uL (ref 150–400)
RDW: 11.9 % (ref 11.5–15.5)

## 2011-11-01 LAB — OSMOLALITY: Osmolality: 248 mOsm/kg — ABNORMAL LOW (ref 275–300)

## 2011-11-01 LAB — COMPREHENSIVE METABOLIC PANEL
AST: 32 U/L (ref 0–37)
Albumin: 4 g/dL (ref 3.5–5.2)
BUN: 9 mg/dL (ref 6–23)
Calcium: 8.9 mg/dL (ref 8.4–10.5)
Creatinine, Ser: 0.41 mg/dL — ABNORMAL LOW (ref 0.50–1.10)
GFR calc non Af Amer: >90 mL/min (ref >90)

## 2011-11-01 LAB — RAPID URINE DRUG SCREEN, HOSP PERFORMED
Amphetamines: NOT DETECTED
Cocaine: NOT DETECTED
Opiates: NOT DETECTED
Tetrahydrocannabinol: NOT DETECTED

## 2011-11-01 LAB — ETHANOL: Alcohol, Ethyl (B): <11 mg/dL (ref 0–11)

## 2011-11-01 LAB — SODIUM, URINE, RANDOM: Sodium, Ur: <10 mEq/L

## 2011-11-01 LAB — CORTISOL-AM, BLOOD: Cortisol - AM: 17.4 ug/dL (ref 4.3–22.4)

## 2011-11-01 MED ORDER — LAMOTRIGINE 150 MG PO TABS
150.0000 mg | ORAL_TABLET | Freq: Every day | ORAL | Status: DC | PRN
  Filled 2011-11-01: qty 1

## 2011-11-01 MED ORDER — SODIUM CHLORIDE 0.9 % IV SOLN
INTRAVENOUS | Status: DC
  Administered 2011-11-01: 06:00:00 via INTRAVENOUS

## 2011-11-01 MED ORDER — ONDANSETRON HCL 4 MG/2ML IJ SOLN
4.0000 mg | Freq: Four times a day (QID) | INTRAMUSCULAR | Status: DC | PRN
  Administered 2011-11-01: 4 mg via INTRAVENOUS
  Filled 2011-11-01: qty 2

## 2011-11-01 MED ORDER — ACETAMINOPHEN 650 MG RE SUPP: 650.0000 mg | Freq: Four times a day (QID) | RECTAL | Status: DC | PRN

## 2011-11-01 MED ORDER — LORAZEPAM 1 MG PO TABS
1.0000 mg | ORAL_TABLET | Freq: Once | ORAL | Status: DC
  Filled 2011-11-01: qty 1

## 2011-11-01 MED ORDER — ONDANSETRON HCL 4 MG PO TABS: 4.0000 mg | ORAL_TABLET | ORAL | Status: DC | PRN

## 2011-11-01 MED ORDER — PREGABALIN 75 MG PO CAPS: 75.0000 mg | ORAL_CAPSULE | Freq: Two times a day (BID) | ORAL | Status: DC | PRN

## 2011-11-01 MED ORDER — ACETAMINOPHEN 325 MG PO TABS
650.0000 mg | ORAL_TABLET | ORAL | Status: DC | PRN
  Administered 2011-11-01 (×2): 650 mg via ORAL
  Filled 2011-11-01 (×2): qty 2

## 2011-11-01 MED ORDER — LISDEXAMFETAMINE DIMESYLATE 50 MG PO CAPS
50.0000 mg | ORAL_CAPSULE | Freq: Every day | ORAL | Status: DC
  Administered 2011-11-01: 50 mg via ORAL
  Filled 2011-11-01: qty 1

## 2011-11-01 MED ORDER — ONDANSETRON HCL 4 MG PO TABS: 4.0000 mg | ORAL_TABLET | Freq: Four times a day (QID) | ORAL | Status: DC | PRN

## 2011-11-01 MED ORDER — DULOXETINE HCL 30 MG PO CPEP
30.0000 mg | ORAL_CAPSULE | Freq: Every day | ORAL | Status: DC
  Administered 2011-11-01: 30 mg via ORAL
  Filled 2011-11-01 (×3): qty 1

## 2011-11-01 MED ORDER — MODAFINIL 200 MG PO TABS
200.0000 mg | ORAL_TABLET | Freq: Every day | ORAL | Status: DC
  Administered 2011-11-01: 200 mg via ORAL
  Filled 2011-11-01: qty 1

## 2011-11-01 MED ORDER — ACETAMINOPHEN 325 MG PO TABS: 650.0000 mg | ORAL_TABLET | Freq: Four times a day (QID) | ORAL | Status: DC | PRN

## 2011-11-01 MED ORDER — POTASSIUM CHLORIDE 10 MEQ/100ML IV SOLN
10.0000 meq | INTRAVENOUS | Status: AC
  Administered 2011-11-01 (×3): 10 meq via INTRAVENOUS
  Filled 2011-11-01 (×4): qty 100

## 2011-11-01 MED ORDER — PANTOPRAZOLE SODIUM 40 MG PO TBEC
80.0000 mg | DELAYED_RELEASE_TABLET | Freq: Every day | ORAL | Status: DC
  Administered 2011-11-01: 80 mg via ORAL
  Filled 2011-11-01: qty 2
  Filled 2011-11-01: qty 1

## 2011-11-01 MED ORDER — KETOROLAC TROMETHAMINE 30 MG/ML IJ SOLN
30.0000 mg | Freq: Once | INTRAMUSCULAR | Status: AC
  Administered 2011-11-01: 30 mg via INTRAVENOUS
  Filled 2011-11-01: qty 1

## 2011-11-01 MED ORDER — ONDANSETRON HCL 4 MG/2ML IJ SOLN: 4.0000 mg | Freq: Four times a day (QID) | INTRAMUSCULAR | Status: DC | PRN

## 2011-11-01 MED ORDER — ACETAMINOPHEN 325 MG PO TABS: 650.0000 mg | ORAL_TABLET | Freq: Once | ORAL | Status: DC

## 2011-11-01 MED ORDER — GABAPENTIN 300 MG PO CAPS
300.0000 mg | ORAL_CAPSULE | Freq: Three times a day (TID) | ORAL | Status: DC | PRN
  Filled 2011-11-01: qty 1

## 2011-11-01 MED ORDER — NICOTINE 7 MG/24HR TD PT24
7.0000 mg | MEDICATED_PATCH | Freq: Every day | TRANSDERMAL | Status: DC
  Administered 2011-11-01: 7 mg via TRANSDERMAL
  Filled 2011-11-01 (×2): qty 1

## 2011-11-01 MED ORDER — ONDANSETRON 8 MG PO TBDP
8.0000 mg | ORAL_TABLET | Freq: Once | ORAL | Status: AC
  Administered 2011-11-01: 8 mg via ORAL
  Filled 2011-11-01: qty 1

## 2011-11-01 MED ORDER — ZOLPIDEM TARTRATE 10 MG PO TABS
10.0000 mg | ORAL_TABLET | Freq: Every evening | ORAL | Status: DC | PRN
  Administered 2011-11-01: 10 mg via ORAL
  Filled 2011-11-01: qty 1

## 2011-11-01 MED ORDER — ONDANSETRON 4 MG PO TBDP: 4.0000 mg | ORAL_TABLET | Freq: Once | ORAL | Status: DC

## 2011-11-01 NOTE — Progress Notes (Signed)
Addendum to admission note: Reviewed labs and agree with assessment and plan done by my colleague. 1. Hyponatremia - likely due to psychogenic polydipsia; follow up urine lytes, osm and cortisol level 2. Hypokalemia - replete and follow up BMP in am  Manson Passey [facility] [phone]

## 2011-11-01 NOTE — ED Notes (Signed)
AC aware of need for sitter, none available.

## 2011-11-01 NOTE — Progress Notes (Signed)
Charge Nurse has paged Dr. Elisabeth Pigeon twice more with no response noted to sign papers to rescind IVC papers.

## 2011-11-01 NOTE — Progress Notes (Signed)
Pt very restless and anxious. Per Pt "I just need to go home and nobody will listen to me.: Pt not receptive to emotional support offered by staff "Just shut up nobody will listen to me"

## 2011-11-01 NOTE — ED Notes (Signed)
Care of pt assumed. Pt reports still feeling nauseous. Asking for nausea medicine. Then asking for graham crackers. Informed she received nausea medicine 2 hours ago and is not due for any more. Asking what dose and if that is the maximum dose, saying "you can increase that. What about a suppository?" Explained to pt that she can speak with the rounding doctor this am about that, however she seems to be tolerating graham crackers and ice. Also sts her MS is her primary concern and she wanted to go to [facility], however per the notes, she is c/o HA and n/v/d. Explained that her labwork is abnormal and that would need to be treated first and if the MD felt a neuro consult was needed, they would order it on the floor. Pt does not seem acceptable of this, keeps repeating that MS is her concern and that she doesn't like the hospitalist service. Encouraged pt again to speak to the rounding MD about her concerns. 2nd of 4 K+ infusion completed at transport. NS to 168ml/hr.

## 2011-11-01 NOTE — ED Notes (Signed)
Pt placed under IVC order, papers on the way with [employer].

## 2011-11-01 NOTE — Progress Notes (Signed)
Pharmacy Tech in to review medications per Pt request. MD has been paged to discuss adjustment of time for PRN Medications for Pain and Nausea.

## 2011-11-01 NOTE — ED Notes (Signed)
Pt. Refused EKG. Pt. States that she needs to get her nausea and pain medicine first.

## 2011-11-01 NOTE — ED Notes (Signed)
Pt states she has pain in her head and nausea.

## 2011-11-01 NOTE — Progress Notes (Signed)
Pt admitted to floor from ER. Pt expressing several issues: Nausea and Headache remain and unrelieved by Tylenol and Zofran given in ER at 6am. Pt also requesting transfer to [facility] "I feel like their care is better than here". Pt also voices concern over home medications and not getting these here.

## 2011-11-01 NOTE — ED Notes (Signed)
EKG old and new given to EDP, [place],MD. 

## 2011-11-01 NOTE — ED Provider Notes (Signed)
History     CSN: [id number]  Arrival date & time 10/31/11  2247   First MD Initiated Contact with Patient 11/01/11 0232      Chief Complaint  Patient presents with  . Medical Clearance    (Consider location/radiation/quality/duration/timing/severity/associated sxs/prior treatment) HPI  Patient brought in by [employer] with IVC papers taken out by her mother. The patient endorses having some nausea and some abdominal pain. She is very sporadic and her speech and talks about her fingernail hurting and now she wants IV medicine. The IVC papers say that she is failing to thrive. She's not eating or taking showers, she is not taking care of herself that she is a harm to herself. Patient is in no acute distress at this time and her vital signs are stable. She's extensive psych history and multiple admissions for medical conditions due to her psychiatric condition.  Past Medical History  Diagnosis Date  . Multiple sclerosis   . Arthritis   . Anxiety   . Bipolar disorder     Past Surgical History  Procedure Date  . Cesarean section     No family history on file.  History  Substance Use Topics  . Smoking status: Current Every Day Smoker -- 1.0 packs/day for 20 years    Types: Cigarettes  . Smokeless tobacco: Never Used  . Alcohol Use: 2.0 oz/week    4 drink(s) per week     occasional    OB History    Grav Para Term Preterm Abortions TAB SAB Ect Mult Living                  Review of Systems Unable due to patient's psychiatric condition. She will only tell me about her nausea and that her fingernail hurts. Allergies  Review of patient's allergies indicates no known allergies.  Home Medications   Current Outpatient Rx  Name Route Sig Dispense Refill  . BUSPIRONE HCL 10 MG PO TABS Oral Take 10 mg by mouth 3 (three) times daily as needed.     . DULOXETINE HCL 30 MG PO CPEP Oral Take 1 capsule (30 mg total) by mouth daily. 90 capsule 2  . ESOMEPRAZOLE MAGNESIUM 40 MG PO CPDR  Oral Take 1 capsule (40 mg total) by mouth daily. 30 capsule 3  . GABAPENTIN 300 MG PO CAPS Oral Take 300 mg by mouth 3 (three) times daily as needed.     Marland Kitchen LAMOTRIGINE 150 MG PO TABS Oral Take 150 mg by mouth daily as needed.     Marland Kitchen LISDEXAMFETAMINE DIMESYLATE 50 MG PO CAPS Oral Take 50 mg by mouth every morning.    Marland Kitchen MODAFINIL 200 MG PO TABS Oral Take 1 tablet (200 mg total) by mouth daily. 30 tablet 0  . PREDNISONE 20 MG PO TABS  2 tablets daily x 5 days, then 1 tablet daily x 5 days 15 tablet 0  . PREGABALIN 75 MG PO CAPS Oral Take 75 mg by mouth 2 (two) times daily as needed.       BP 107/74  Pulse 60  Resp 20  SpO2 98%  Physical Exam  Nursing note and vitals reviewed. Constitutional: She appears well-developed and well-nourished. No distress.  HENT:  Head: Normocephalic and atraumatic.  Eyes: Pupils are equal, round, and reactive to light.  Neck: Normal range of motion. Neck supple.  Cardiovascular: Normal rate and regular rhythm.   Pulmonary/Chest: Effort normal.  Abdominal: Soft.  Neurological: She is alert.  Skin: Skin is warm  and dry.  Psychiatric: Her affect is inappropriate. She expresses homicidal ideation. She expresses no suicidal ideation. She expresses no suicidal plans and no homicidal plans. She is attentive.    ED Course  Procedures (including critical care time)  Labs Reviewed  CBC - Abnormal; Notable for the following:    HCT 35.2 (*)     MCHC 36.9 (*)     All other components within normal limits  COMPREHENSIVE METABOLIC PANEL - Abnormal; Notable for the following:    Sodium 120 (*)     Potassium 3.2 (*)     Chloride 84 (*)     Glucose, Bld 111 (*)     Creatinine, Ser 0.41 (*)     All other components within normal limits  ETHANOL  URINE RAPID DRUG SCREEN (HOSP PERFORMED)   No results found.   1. Failure to thrive (0-17)   2. Hyponatremia       MDM  Patient is a psych concern and will need psychiatric evaluation. However,  On her labs it  was noted that her sodium is 120. This is most likely due to either psychogenic polydipsia or her not eating or drinking due to her psychiatric problems.    Date: 11/01/2011  Rate: 64  Rhythm: normal sinus rhythm  QRS Axis: normal  Intervals: normal  ST/T Wave abnormalities: borderline t-wave abnormality  Conduction Disutrbances:none  Narrative Interpretation:   Old EKG Reviewed: inproved from May 23. 2013    [facility] Team 8, inpatient, Telemetry        Dorthula Matas, [place] 11/01/11 0439  Dorthula Matas, PA 11/01/11 [phone]

## 2011-11-01 NOTE — H&P (Signed)
[facility] History and Physical  RENDI MAPEL AVW:[id number] DOB: [birth year] DOA: 10/31/2011  Referring physician: ED PCP: No primary provider on file.   Chief Complaint: Headache, nausea  HPI: Anne Black is a 51 y.o. female with h/o MS who is in the hospital today as a psychiatric patient who complained of feeling bad with headache, nausea, and generalized MS pain.  Patient was found to have hyponatremia and the hospitalist service has been asked to admit the patient.  Unfortunately I am not able to really get a good idea regarding the onset of her symptoms, just that she is having nausea and a headache and has had hyponatremia before.  The patient is not happy with her current care, feels that she should not be committed as a psychiatric patient, feels she has been treated poorly here, and wants me to release her from her psych committment after treating her hyponatremia, insists she has been eating and drinking fluids.  Review of Systems: 12 systems reviewed and otherwise negative.  Past Medical History  Diagnosis Date  . Multiple sclerosis   . Arthritis   . Anxiety   . Bipolar disorder    Past Surgical History  Procedure Date  . Cesarean section    Social History:  reports that she has been smoking Cigarettes.  She has a 20 pack-year smoking history. She has never used smokeless tobacco. She reports that she drinks about 2 ounces of alcohol per week. She reports that she does not use illicit drugs.  No Known Allergies  No family history on file.  Prior to Admission medications   Medication Sig Start Date End Date Taking? Authorizing Provider  busPIRone (BUSPAR) 10 MG tablet Take 10 mg by mouth 3 (three) times daily as needed.  08/19/11 08/18/12  Gwyneth Sprout, MD  DULoxetine (CYMBALTA) 30 MG capsule Take 1 capsule (30 mg total) by mouth daily. 10/22/11 10/21/12  Elvina Sidle, MD  esomeprazole (NEXIUM) 40 MG capsule Take 1 capsule (40 mg total) by mouth daily.  10/09/11 10/08/12  Elvina Sidle, MD  gabapentin (NEURONTIN) 300 MG capsule Take 300 mg by mouth 3 (three) times daily as needed.     Historical Provider, MD  lamoTRIgine (LAMICTAL) 150 MG tablet Take 150 mg by mouth daily as needed.     Historical Provider, MD  lisdexamfetamine (VYVANSE) 50 MG capsule Take 50 mg by mouth every morning.    Historical Provider, MD  modafinil (PROVIGIL) 200 MG tablet Take 1 tablet (200 mg total) by mouth daily. 10/25/11   Sondra Barges, PA-C  predniSONE (DELTASONE) 20 MG tablet 2 tablets daily x 5 days, then 1 tablet daily x 5 days 10/30/11   Ethelda Chick, MD  pregabalin (LYRICA) 75 MG capsule Take 75 mg by mouth 2 (two) times daily as needed.  08/19/11 08/18/12  Gwyneth Sprout, MD   Physical Exam: Filed Vitals:   10/31/11 2322  BP: 107/74  Pulse: 60  Resp: 20  SpO2: 98%     General:  NAD  Eyes: PEERLA EOMI  ENT: moist mucous membranes  Neck: supple w/o JVD  Cardiovascular: RRR w/o MRG  Respiratory: CTA B  Abdomen: mild diffuse tenderness, soft, no guarding, no rebound, normoactive bowelsounds  Skin: no rash  Musculoskeletal: MAE, 5/5 strength in all extremities  Psychiatric: will defer psychiatric exam to the psychiatrist, needless to say I agree that there are abnormalities on her psych exam.  Neurologic: AAOx3, no focal deficits  Labs on Admission:  Basic Metabolic  Panel:  Lab 11/01/11 0135  NA 120*  K 3.2*  CL 84*  CO2 24  GLUCOSE 111*  BUN 9  CREATININE 0.41*  CALCIUM 8.9  MG --  PHOS --   Liver Function Tests:  Lab 11/01/11 0135  AST 32  ALT 30  ALKPHOS 68  BILITOT 0.6  PROT 6.4  ALBUMIN 4.0   No results found for this basename: LIPASE:5,AMYLASE:5 in the last 168 hours No results found for this basename: AMMONIA:5 in the last 168 hours CBC:  Lab 11/01/11 0135  WBC 8.0  NEUTROABS --  HGB 13.0  HCT 35.2*  MCV 87.8  PLT 219   Cardiac Enzymes: No results found for this basename:  CKTOTAL:5,CKMB:5,CKMBINDEX:5,TROPONINI:5 in the last 168 hours  BNP (last 3 results) No results found for this basename: PROBNP:3 in the last 8760 hours CBG: No results found for this basename: GLUCAP:5 in the last 168 hours  Radiological Exams on Admission: No results found.  EKG: Independently reviewed.  Assessment/Plan Principal Problem:  *Hyponatremia Active Problems:  Altered mental status  Hypokalemia  MS (multiple sclerosis)   1. Hyponatremia - check cortisol, TSH, serum osm, urine osm, urine sodium, hydrate gently with iv NS, suspect a psychiatric component in this (perhaps psychogenic polydipsia) rather than a primary SIADH but labs will tell us either way. 2. Hypokalemia - replace potassium 3. MS - stable at this time.  Code Status: Full code Family Communication: No family present in room Disposition Plan: to psych when stable  Time spent: 30 mins  GARDNER, JARED M. [facility] Pager [phone]  If 7PM-7AM, please contact night-coverage [email or web address] Password [facility] 11/01/2011, 3:58 AM

## 2011-11-01 NOTE — ED Provider Notes (Signed)
Medical screening examination/treatment/procedure(s) were conducted as a shared visit with non-physician practitioner(s) and myself.  I personally evaluated the patient during the encounter  Awake, alert, retching in triage room. Zofran ODT ordered.    Hanley Seamen, MD 11/01/11 [phone]

## 2011-11-01 NOTE — Progress Notes (Signed)
Charge Nursehas paged Dr. Elisabeth Pigeon twice to remind her that the papers to rescind the IVC papers are at the desk and need to be completed and signed.

## 2011-11-01 NOTE — Progress Notes (Signed)
Dr. Elisabeth Pigeon on the floor and requested to go speak with Patient regarding her multiple questions and issues regarding this admission. Dr. Elisabeth Pigeon in room to talk to Pt.

## 2011-11-01 NOTE — ED Notes (Signed)
Anne Black [phone] Pts Mother

## 2011-11-02 ENCOUNTER — Other Ambulatory Visit: Payer: Self-pay | Admitting: Family Medicine

## 2011-11-02 DIAGNOSIS — E876 Hypokalemia: Secondary | ICD-10-CM

## 2011-11-02 DIAGNOSIS — D649 Anemia, unspecified: Secondary | ICD-10-CM

## 2011-11-02 DIAGNOSIS — K219 Gastro-esophageal reflux disease without esophagitis: Secondary | ICD-10-CM

## 2011-11-02 DIAGNOSIS — E871 Hypo-osmolality and hyponatremia: Principal | ICD-10-CM

## 2011-11-02 MED ORDER — PANTOPRAZOLE SODIUM 40 MG PO TBEC: 40.0000 mg | DELAYED_RELEASE_TABLET | Freq: Every day | ORAL | Status: DC

## 2011-11-02 MED ORDER — BUPROPION HCL ER (XL) 150 MG PO TB24: 150.0000 mg | ORAL_TABLET | Freq: Every day | ORAL | Status: DC

## 2011-11-02 MED ORDER — LISDEXAMFETAMINE DIMESYLATE 50 MG PO CAPS: 50.0000 mg | ORAL_CAPSULE | ORAL | Status: DC

## 2011-11-02 NOTE — Progress Notes (Addendum)
Notified by Unit CSW that Pt is wanting to leave without being seen by psych.  Notified MD.  Per MD, Ok for Pt to leave without being seen by psych.  Notified RN.  Faxed Examination and Recommendation asking to rescind IVC papers to [employer].  No further psych needs.  Psych CSW to sign off, as Pt is leaving.  Providence Crosby, LCSWA Clinical Social Work [phone]

## 2011-11-02 NOTE — Progress Notes (Addendum)
Patient was seen and examined at bedside. I have reviewed labs with patient. Patient's potassium and sodium are within normal limits so  will not be providing prescriptions for that as patient requested supplements for potassium and sodium.  Patient has no suicidal or homicidal ideations. She is definitely capable of making her own treatment decisions. She does not need to be involuntarily committed. I did request a psychiatric consult to adjust patient's medications. Patient reported she takes Cymbalta, Wellbutrin, Buspar and Prozac for depression and anxiety and reports being compliant with this medication. She reported she will follow up with psych outpatient.  Manson Passey [phone]

## 2011-11-02 NOTE — Progress Notes (Signed)
Physical Therapy note --also late for 11/01/11-- PT is not indicated for this pt. At this time. On 9/23 RN declined for PT to see pt. Blanchard Kelch PT [phone]

## 2011-11-02 NOTE — Discharge Summary (Signed)
Physician Discharge Summary  MYLI PAE ZOX:[id number] DOB: 03-Nov-1960 DOA: 10/31/2011  PCP: No primary provider on file.  Admit date: 10/31/2011 Discharge date: 11/02/2011  Recommendations for Outpatient Follow-up:  1. Followup with neurology and psychiatry per scheduled appointment in one to 2 weeks post discharge.  Discharge Diagnoses:  Principal Problem:  *Hyponatremia Active Problems:  Altered mental status  Hypokalemia  MS (multiple sclerosis)    Discharge Condition: Medically stable for discharge home today  Diet recommendation: As tolerated  History of present illness:  51 year old female with history of multiple sclerosis, depression and anxiety who presented with nausea, vomiting and abdominal pain. Evaluation in the emergency department reveal hyponatremia and hypokalemia.  Assessment and plan  Active problems Abdominal pain, nausea and vomiting - Resolved at this time, perhaps gastroenteritis - All electrolytes are within the normal limits prior to discharge  Depression and anxiety - Patient does not need to be involuntarily committed. She's not suicidal or homicidal at this time. She is aware of her medical conditions and is taking medications which include Cymbalta, Wellbutrin and vyvanse. Psychiatry consult initially requested to adjust psychiatric medications but patient reports that the medications have already been adjusted on an outpatient basis and she will followup with her psychiatrist per her scheduled appointment. - Patient is stable for discharge and we can discontinue the sitter at bedside  Manson Passey [facility] Pager: [phone]  Discharge Exam: Filed Vitals:   11/02/11 0627  BP: 148/77  Pulse: 65  Temp: 98.2 F (36.8 C)  Resp: 20   Filed Vitals:   11/01/11 1139 11/01/11 1427 11/01/11 2129 11/02/11 0627  BP: 98/53 114/60 120/52 148/77  Pulse: 119 62 64 65  Temp: 98.5 F (36.9 C) 98.1 F (36.7 C) 97.7 F (36.5 C) 98.2 F (36.8 C)    TempSrc: Oral Oral Oral Oral  Resp: 18 18 20 20   Height: 5\' 2"  (1.575 m)     Weight: 56.9 kg (125 lb 7.1 oz)     SpO2: 96% 100% 100% 97%    General: Pt is alert, follows commands appropriately, not in acute distress Cardiovascular: Regular rate and rhythm, S1/S2 +, no murmurs, no rubs, no gallops Respiratory: Clear to auscultation bilaterally, no wheezing, no crackles, no rhonchi Abdominal: Soft, non tender, non distended, bowel sounds +, no guarding Extremities: no edema, no cyanosis, pulses palpable bilaterally DP and PT Neuro: Grossly nonfocal  Discharge Instructions  Discharge Orders    Future Orders Please Complete By Expires   Diet - low sodium heart healthy      Increase activity slowly      Call MD for:  persistant nausea and vomiting      Call MD for:  severe uncontrolled pain      Call MD for:  difficulty breathing, headache or visual disturbances      Call MD for:  persistant dizziness or light-headedness          Medication List     As of 11/02/2011  9:50 AM    TAKE these medications         buPROPion 150 MG 24 hr tablet   Commonly known as: WELLBUTRIN XL   Take 150 mg by mouth daily.      busPIRone 10 MG tablet   Commonly known as: BUSPAR   Take 10 mg by mouth 3 (three) times daily.      DULoxetine 30 MG capsule   Commonly known as: CYMBALTA   Take 1 capsule (30 mg total) by mouth  daily.      esomeprazole 40 MG capsule   Commonly known as: NEXIUM   Take 1 capsule (40 mg total) by mouth daily.      gabapentin 300 MG capsule   Commonly known as: NEURONTIN   Take 300 mg by mouth 3 (three) times daily.      lamoTRIgine 25 MG tablet   Commonly known as: LAMICTAL   Take 50 mg by mouth daily.      lisdexamfetamine 50 MG capsule   Commonly known as: VYVANSE   Take 50 mg by mouth every morning.      meloxicam 15 MG tablet   Commonly known as: MOBIC   Take 15 mg by mouth daily.      modafinil 200 MG tablet   Commonly known as: PROVIGIL   Take 1  tablet (200 mg total) by mouth daily.      potassium chloride 10 MEQ tablet   Commonly known as: K-DUR   Take 20 mEq by mouth daily.      predniSONE 20 MG tablet   Commonly known as: DELTASONE   2 tablets daily x 5 days, then 1 tablet daily x 5 days      pregabalin 75 MG capsule   Commonly known as: LYRICA   Take 75 mg by mouth 2 (two) times daily as needed.      PROZAC 20 MG capsule   Generic drug: FLUoxetine   Take 20 mg by mouth daily.      risperiDONE 1 MG tablet   Commonly known as: RISPERDAL   Take 1 mg by mouth 2 (two) times daily.          The results of significant diagnostics from this hospitalization (including imaging, microbiology, ancillary and laboratory) are listed below for reference.    Significant Diagnostic Studies: No results found.  Microbiology: Recent Results (from the past 240 hour(s))  MRSA PCR SCREENING     Status: Normal   Collection Time   11/01/11  9:08 AM      Component Value Range Status Comment   MRSA by PCR NEGATIVE  NEGATIVE Final      Labs: Basic Metabolic Panel:  Lab 11/01/11 7829 11/01/11 1132 11/01/11 0550 11/01/11 0135  NA 139 130* 121* 120*  K 3.7 4.0 3.4* 3.2*  CL 105 97 87* 84*  CO2 27 24 25 24   GLUCOSE 118* 90 96 111*  BUN 10 9 8 9   CREATININE 0.61 0.60 0.40* 0.41*  CALCIUM 8.5 8.7 9.0 8.9  MG -- -- -- --  PHOS -- -- -- --   Liver Function Tests:  Lab 11/01/11 0135  AST 32  ALT 30  ALKPHOS 68  BILITOT 0.6  PROT 6.4  ALBUMIN 4.0  CBC:  Lab 11/01/11 0135  WBC 8.0  NEUTROABS --  HGB 13.0  HCT 35.2*  MCV 87.8  PLT 219   Time coordinating discharge: Over 30 minutes

## 2011-11-02 NOTE — Progress Notes (Signed)
Patient in hallway this am walking around, standing in front of secretary desk entire morning. Patient was told multiple times to return to room so that discharge information could be gone over. Patient very anxious about discharge, still in hallway, RN unable to perform assessment on patient. Patient is alert and oriented, no complaints of pain, no shortness of breath.  Will continue to monitor patient. Setzer, Don Broach

## 2011-11-02 NOTE — Care Management Note (Signed)
    Page 1 of 1   11/02/2011     11:26:26 AM   CARE MANAGEMENT NOTE 11/02/2011  Patient:  Anne Black, Anne Black   Account Number:  [id number]  Date Initiated:  11/02/2011  Documentation initiated by:  Lanier Clam  Subjective/Objective Assessment:   ADMITTED W/HYPONATREMIA.     Action/Plan:   FROM HOME   Anticipated DC Date:  11/02/2011   Anticipated DC Plan:  HOME/SELF CARE  In-house referral  Clinical Social Worker      DC Planning Services  CM consult      Choice offered to / List presented to:             Status of service:  Completed, signed off Medicare Important Message given?  NA - LOS <3 / Initial given by admissions (If response is "NO", the following Medicare IM given date fields will be blank) Date Medicare IM given:   Date Additional Medicare IM given:    Discharge Disposition:    Per UR Regulation:  Reviewed for med. necessity/level of care/duration of stay  If discussed at Long Length of Stay Meetings, dates discussed:    Comments:  11/02/11 Toria Monte RN,BSN NCM [phone] PSYCH-RECOMMENDATIONS NOTED.SW NOTED.

## 2011-11-03 ENCOUNTER — Telehealth: Payer: Self-pay

## 2011-11-03 NOTE — Telephone Encounter (Signed)
Phone rang twice and then line went dead. Tried again rang once and then line went dead. Will try again later

## 2011-11-03 NOTE — Telephone Encounter (Signed)
Pt was discharged from the hospital yesterday 11/03/11, but Mayci states she does not have her prescriptions from hospital.  She states hospital prescribing provider is out of town for a week and [facility]  Advised she would need to RTC to receive her medications.  Pt was confrontational and also very strongly stated that she was recovering and could not RTC,   Zahniya also wants her [facility] referral to be urgent, explained referral was made on Monday and [facility] would call when they have reviewed records, wants [place] to call to get appointment for her right away.   [phone]

## 2011-11-04 ENCOUNTER — Telehealth: Payer: Self-pay | Admitting: Physician Assistant

## 2011-11-04 NOTE — Telephone Encounter (Signed)
On 11/03/2011 Ms. Horsford called reporting that she did not receive her prescriptions at discharge.  After discussing the situation with Dr. Lavera Guise, I contacted Ms. Stuckert to determine which prescriptions she needed.  She requested Vyvanse, Wellbutrin, Cymbalta, potassium, and mobic.  I called Mobic, potassium, cymbalta [facility] [place]  I wrote a paper prescription for Vyvanse which Ms. Appleyard picked up at the [facility] admissions office.    The prescription doses were identical to the discharge summary dated 11/02/2011.  Thirty day supply was written for each with the exception of potassium for which a 10 day prescription was written.   Ms Moyd did not want all of the medications prescribed for her.  She refused Prozac, Lyrica, Provigil, and Gabapentin.  Later in the day 11/03/2011 Ms. Willets called complaining of nausea and requested nausea medication.  I called in a prescription for Zofran 4 mg tab, 1 q 4 hours.  Quantity 40.  No refills.  On the morning of 11/04/2011 Ms. Righetti called requesting a dose of Steroid IM or IV.  Her reasoning was that she was unable to take PO prednisone.  She is currently prescribed prednisone 40 mg daily for 5 days (from 9/24), then 20 mg daily for 5 days.  I explained to Ms. Krog that I have never seen her or examined her and consequently I'm uncomfortable making changes to her prescriptions. She was quite persistent and insisted that I provide her with IV steroids.   I directed her to return Urgent Care or the ED where she could be evaluated by a physician.  Algis Downs, PA-C [facility] Pager: [phone]

## 2011-11-05 ENCOUNTER — Telehealth: Payer: Self-pay

## 2011-11-05 NOTE — Telephone Encounter (Signed)
Kim from [facility] [place] called to discuss some things regarding Ms. Rashad's medication.  Please call the pharmacy at [phone].

## 2011-11-05 NOTE — Telephone Encounter (Signed)
The pharmacy was calling with questions about her meds, they are going to call internal medicine.

## 2011-11-05 NOTE — Telephone Encounter (Signed)
Her doctors at internal med have taken care of her already. Pharmacy has called, she is taken care of.

## 2011-11-08 NOTE — Progress Notes (Signed)
Reviewed and agree.

## 2011-11-12 ENCOUNTER — Telehealth: Payer: Self-pay

## 2011-11-12 NOTE — Telephone Encounter (Signed)
Alice from [facility] called.  Dr. Salvatore Marvel at [facility] evaluated the referral and records we faxed to them re: [facility] Multiple sclerosis.  Dr. Anne Hahn feels that Dr. Epimenio Foot at [facility] is the expert in MS and patient would be better served to see Dr. Epimenio Foot.    Dr. Katrinka Blazing, would you like [place] to forward the referral to Dr. Epimenio Foot?

## 2011-11-12 NOTE — Telephone Encounter (Signed)
I would contact patient and relay the message from Dr. Salvatore Marvel

## 2011-11-13 ENCOUNTER — Telehealth: Payer: Self-pay

## 2011-11-13 NOTE — Telephone Encounter (Signed)
I would contact patient and relay the message from Dr. Salvatore Marvel.  If patient agreeable, then forward referral to Dr. Epimenio Foot at [facility]. KMS

## 2011-11-13 NOTE — Telephone Encounter (Signed)
Patient would like to know if she comes in the see Dr Milus Glazier if he would do a Steroid Injection.  Patient states that she came in and another doctor here would not do it for her.  Patient states that she has MS and does not want to waste the energy to come in if she doesn't need to.     Best#: [id number]

## 2011-11-13 NOTE — Telephone Encounter (Signed)
As stated to patient before, she needs neurology consultation to evaluate for a steroid shot.  We do not give these for MS without neurology advice.

## 2011-11-19 NOTE — Telephone Encounter (Signed)
Called patient to advise Neurology needs to eval on this.

## 2011-12-01 ENCOUNTER — Other Ambulatory Visit: Payer: Self-pay | Admitting: Family Medicine

## 2011-12-01 NOTE — Telephone Encounter (Signed)
PT [facility] AND SHE WOULD LIKE TO HAVE IT DONE AT [facility] WITH 3 REFILLS AND THAT WAY SHE CAN GET THEM ALL AT THE SAME TIME PLEASE CALL [phone]    [facility]

## 2011-12-01 NOTE — Telephone Encounter (Signed)
I do not see that we have ever written Mobic for her, and also a refill on her prednisone is not appropriate. She will need to follow up with Neurology which she has already been instructed to do.

## 2011-12-04 ENCOUNTER — Other Ambulatory Visit: Payer: Self-pay | Admitting: Family Medicine

## 2011-12-05 ENCOUNTER — Telehealth: Payer: Self-pay

## 2011-12-05 NOTE — Telephone Encounter (Signed)
PATIENT IS REQUESTING THAT HER MELOXICAM TAB 15MG  BE SENT INTO HER MAIL ORDER PRESCRIPTION PROGRAM. THEY WILL GIVE HER A 3 MONTH SUPPLY. PLEASE CALL HER WHEN IT HAS BEEN DONE. BEST PHONE [phone] (CELL)  MBC

## 2011-12-06 NOTE — Telephone Encounter (Signed)
Have pended Rx for 3 mo supply/ please sign if renewal appropriate, [facility]

## 2011-12-06 NOTE — Telephone Encounter (Signed)
We have never prescribed Mobic for this patient, so cannot refill at this time.

## 2011-12-06 NOTE — Telephone Encounter (Signed)
Left message for her to advise.  

## 2011-12-09 ENCOUNTER — Telehealth: Payer: Self-pay

## 2011-12-09 ENCOUNTER — Encounter: Payer: BC Managed Care – PPO | Admitting: Physician Assistant

## 2011-12-09 NOTE — Progress Notes (Signed)
I initially pulled up chart for review as patient was checking in to be seen.  During course of check-in, patient decided against being seen.  Erroneous encounter placed.  This encounter was created in error - please disregard.

## 2011-12-09 NOTE — Telephone Encounter (Signed)
PATIENT REQUEST A LETTER WRITTEN FOR WHY RX WILL NOT BE REFILLED BY A PROVIDER AT [facility]. PATIENT IS VERY CONFUSED AND WANTS TO KNOW IF SHE CAN BE SEEN HERE FOR HER DEPRESSION.

## 2011-12-17 ENCOUNTER — Telehealth: Payer: Self-pay

## 2011-12-17 NOTE — Telephone Encounter (Signed)
LMOM to CB. We have not sent in any Rxs for pt since Sept. See OV notes from Dr Katrinka Blazing - we are not going to manage pt's neurological or psychiatric meds. We referred to neurology and pt declined psychiatrist referral. Dr Katrinka Blazing suggested that she find a PCP that could see her consistently.

## 2011-12-17 NOTE — Telephone Encounter (Signed)
[facility] that Dr Milus Glazier sent in refills but they need to go to the [facility].    Best#:[phone]

## 2011-12-20 NOTE — Telephone Encounter (Signed)
Left message

## 2011-12-22 NOTE — Telephone Encounter (Signed)
Sent unable to reach letter and explained Dr Michaelle Copas decision that we can not manage her medications in the letter.

## 2011-12-24 ENCOUNTER — Telehealth: Payer: Self-pay | Admitting: Internal Medicine

## 2011-12-24 ENCOUNTER — Ambulatory Visit: Payer: Medicare Other | Admitting: Internal Medicine

## 2011-12-24 NOTE — Telephone Encounter (Signed)
No charge per Dr. Gessner. 

## 2012-01-22 ENCOUNTER — Telehealth: Payer: Self-pay

## 2012-01-22 DIAGNOSIS — G35 Multiple sclerosis: Secondary | ICD-10-CM

## 2012-01-22 NOTE — Telephone Encounter (Signed)
PT WOULD LIKE A CALL BACK TO SEE IF WE WILL TREAT HER.  SAYS SHE HAS BEEN TOLD SEVERAL TIMES BY [place] THAT SHE WILL HAVE TO SEE NEUROLOGIST.  WE HAVE REFERRED HER AND [facility] SHE SAYS CANCELED HER APPOINTMENT.   WANTS [place] TO GIVE HER SOME TYPE OF TREATMENT TO HELP HER UNTIL SHE CAN GET INTO A NEUROLOGIST.  I TRIED TO GET PATIENT TO COME IN TO BE EVALUATED AND SHE BECAME VERY UPSET, SAYING YOU DO NOT ASK A MS PATIENT TO COME OUT IN THIS TYPE OF WEATHER.  SAYS SHE HAS TO KNOW IF WE CAN HELP HER BEFORE SHE WOULD COME IN.   SHE SAYS SHE HAS THE RIGHT TO BE TREATED AND TO FEEL BETTER.  SHE IS VERY IRRITATED.  PLEASE READ PREVIOUS PHONE MESSAGES ABOUT THIS.  [phone]

## 2012-01-23 ENCOUNTER — Other Ambulatory Visit: Payer: Self-pay | Admitting: Internal Medicine

## 2012-01-23 NOTE — Telephone Encounter (Signed)
Forward to Dr. Smith. 

## 2012-01-23 NOTE — Telephone Encounter (Signed)
Left message for patient to return call. She needs to see neurology. We can help with that referral again if we need to. Dr. Katrinka Blazing notes and Dr. Milus Glazier notes reviewed. We will not treat MS or psychiatric.

## 2012-01-24 NOTE — Telephone Encounter (Signed)
Called her again, advised will send neurology appt. Told her I am sorry not anything else here to offer. She is angry. Told her we will resend the referral. Amy

## 2012-01-24 NOTE — Addendum Note (Signed)
Addended byCaffie Damme on: 01/24/2012 02:38 PM   Modules accepted: Orders

## 2012-01-24 NOTE — Telephone Encounter (Signed)
Called patient, to explain to her she needs to see the neurologist.

## 2012-01-25 ENCOUNTER — Other Ambulatory Visit: Payer: Self-pay | Admitting: Physician Assistant

## 2012-01-25 NOTE — Telephone Encounter (Signed)
Pt wants to change medication on three refills she has a coupon [facility] [place] [facility] [employer]  [phone]

## 2012-01-27 NOTE — Telephone Encounter (Signed)
Called patient

## 2012-01-27 NOTE — Telephone Encounter (Signed)
Patient asking for an infusion, I have advised her again we do not do infusions here, she has to go to neurology. I have transferred her to appt she wants to schedule appt. For a physical.

## 2012-01-27 NOTE — Addendum Note (Signed)
Addended byCaffie Damme on: 01/27/2012 09:38 AM   Modules accepted: Orders

## 2012-01-27 NOTE — Telephone Encounter (Signed)
She wants meloxicam through mail order. She wants provigil I have advise she should get this from the neurologist. She states she will come in for this. I told her I will ask about the meloxicam. Please advise.

## 2012-01-27 NOTE — Telephone Encounter (Signed)
I spoke to patient she does not know what this message is regarding. She states she wants to use mail order.

## 2012-01-28 NOTE — Telephone Encounter (Signed)
Dr. Elbert Ewings, I am forwarding this to you, but will talk to you about this when I get there.

## 2012-02-01 ENCOUNTER — Telehealth: Payer: Self-pay | Admitting: Gastroenterology

## 2012-02-01 NOTE — Telephone Encounter (Signed)
Patient states she is having severe reflux. Suggested she seek care at ED or Urgent Care if she needs care today. She states she may do this but would like to schedule OV here also. Scheduled patient on 02/04/12 at 1:30 PM with Dr. Christella Hartigan.

## 2012-02-01 NOTE — Telephone Encounter (Signed)
Left a message for patient to return my call. 

## 2012-02-04 ENCOUNTER — Encounter: Payer: Self-pay | Admitting: Gastroenterology

## 2012-02-04 ENCOUNTER — Ambulatory Visit (INDEPENDENT_AMBULATORY_CARE_PROVIDER_SITE_OTHER): Payer: Medicare Other | Admitting: Gastroenterology

## 2012-02-04 VITALS — BP 92/66 | HR 64 | Ht 61.5 in | Wt 135.6 lb

## 2012-02-04 DIAGNOSIS — Z1211 Encounter for screening for malignant neoplasm of colon: Secondary | ICD-10-CM

## 2012-02-04 DIAGNOSIS — R1013 Epigastric pain: Secondary | ICD-10-CM

## 2012-02-04 MED ORDER — ESOMEPRAZOLE MAGNESIUM 40 MG PO CPDR: 40.0000 mg | DELAYED_RELEASE_CAPSULE | Freq: Every day | ORAL | Status: AC

## 2012-02-04 NOTE — Patient Instructions (Addendum)
One of your biggest health concerns is your smoking.  This increases your risk for most cancers and serious cardiovascular diseases such as strokes, heart attacks.  You should try your best to stop.  If you need assistance, please contact your PCP or Smoking Cessation Class at [facility] [phone]) or [facility] Quit-Line (1-800-QUIT-NOW). Many of your medicines very commonly cause GI side effects such as nausea, abd pains, dyspepsia. Start nexium, one pill daily.  Best take 20-30 min before a meal. New script was called into your pharmacy. You will be set up for an upper endoscopy (LEC propofol sedation) for dyspepsia, abdominal pain. You will be set up for a colonoscopy for screening (LEC propofol sedation). You have decided to think about having these procedures before you schedule, please call when ready to set this up.  [phone]

## 2012-02-04 NOTE — Progress Notes (Signed)
HPI: This is a  51 year old woman whom I am meeting for the first time today.  Has had "tummy problems."  A lot of nausea, belching.  Abd pains all over her abdomen that she tells me is tender to touch.  The belching is really bad.  Pyrosis for a few months.  Has tried various PPIs without much real change in her symptoms  HAs been under "substantial stress."  GI cocktail in urgent clinic helped for her pains, dyspepsia.  Was taking PPI in past, usually in AM usually after breakfast.  Has gained 12 pounds in a few months.  Takes meloxicam, for a long time.        Review of systems: Pertinent positive and negative review of systems were noted in the above HPI section. Complete review of systems was performed and was otherwise normal.    Past Medical History  Diagnosis Date  . Multiple sclerosis   . Arthritis   . Anxiety   . Bipolar disorder   . ADHD (attention deficit hyperactivity disorder)     Past Surgical History  Procedure Date  . Cesarean section     Current Outpatient Prescriptions  Medication Sig Dispense Refill  . buPROPion (WELLBUTRIN XL) 150 MG 24 hr tablet Take 1 tablet (150 mg total) by mouth daily.  30 tablet  0  . DULoxetine (CYMBALTA) 30 MG capsule Take 1 capsule (30 mg total) by mouth daily.  90 capsule  2  . FLUoxetine (PROZAC) 20 MG capsule Take 20 mg by mouth daily.      Marland Kitchen gabapentin (NEURONTIN) 300 MG capsule Take 300 mg by mouth 3 (three) times daily.      Marland Kitchen lisdexamfetamine (VYVANSE) 50 MG capsule Take 1 capsule (50 mg total) by mouth every morning.  30 capsule  0  . meloxicam (MOBIC) 15 MG tablet Take 15 mg by mouth daily.      . modafinil (PROVIGIL) 200 MG tablet Take 1 tablet (200 mg total) by mouth daily.  30 tablet  0  . potassium chloride (K-DUR) 10 MEQ tablet Take 20 mEq by mouth daily.      . risperiDONE (RISPERDAL) 1 MG tablet Take 1 mg by mouth 2 (two) times daily.        Allergies as of 02/04/2012  . (No Known Allergies)     History reviewed. No pertinent family history.  History   Social History  . Marital Status: Divorced    Spouse Name: N/A    Number of Children: 5  . Years of Education: N/A   Occupational History  .      on social security disability for MS   Social History Main Topics  . Smoking status: Current Every Day Smoker -- 1.0 packs/day for 20 years    Types: Cigarettes  . Smokeless tobacco: Never Used  . Alcohol Use: 2.0 oz/week    4 drink(s) per week     Comment: occasional  . Drug Use: No  . Sexually Active: Not Currently   Other Topics Concern  . Not on file   Social History Narrative  . No narrative on file       Physical Exam: BP 92/66  Pulse 64  Ht 5' 1.5" (1.562 m)  Wt 135 lb 9.6 oz (61.508 kg)  BMI 25.21 kg/m2 Constitutional: generally well-appearing Psychiatric: alert and oriented x3 Eyes: extraocular movements intact Mouth: oral pharynx moist, no lesions Neck: supple no lymphadenopathy Cardiovascular: heart regular rate and rhythm Lungs: clear to auscultation bilaterally  Abdomen: soft, nontender, nondistended, no obvious ascites, no peritoneal signs, normal bowel sounds Extremities: no lower extremity edema bilaterally Skin: no lesions on visible extremities    Assessment and plan: 51 y.o. female with  dyspepsia, nausea, routine risk for colon cancer  She has several months of dyspepsia, nausea. I suspect some of her psychiatric medicines could be contributing. She is also taking meloxicam daily which is a high-strength NSAID which can also contribute. It doesn't sound like she is taking her proton pump inhibitor at the current time relation to meals since I recommended she change. I also recommended EGD to check for ulcer disease, H. pylori infection. She brought colon cancer screening and I recommended colonoscopy for routine risk colon cancer screening. She wants both of those done before the end of 2013. I am away all next week and that will not be  possible. She requires extra sedation given her multiple psychiatric issues with monitored anesthesia.  She was not happy about that his, she said the only reason she came in with so that she can have her EGD done before year. I see on her history that she canceled last-minute an office visit with Dr. Leone Payor last month. I explained to her that is just not reasonable to expect those tests to be done for her chronic conditions within the next 3 days.

## 2012-02-10 ENCOUNTER — Telehealth: Payer: Self-pay | Admitting: Gastroenterology

## 2012-02-10 NOTE — Telephone Encounter (Signed)
Pt is calling to complain that her GERD is worse and she wants Dr Christella Hartigan to give her GI cocktail in the office today,  She was very insistent and when I tried to explain to her that we do not treat patients with medication in the office she continued to ask why and state that we are ridiculous.  She did not schedule the procedures that Dr Christella Hartigan recommended in the office and does not wish to schedule them with me over the phone today.  She was very rude.  I explained that if she was in that much distress she should go the ER and then she questioned me about what she would be given at the ER and if they would give her GI cocktail.  I explained that the treatment would be up to the ER MD.  I told her to monitor her diet and I explained GERD precautions and she said she knew all of that and then a few minutes later said she ate a sausage biscuit this morning and then said we were unreal and hung up.

## 2012-02-11 NOTE — Telephone Encounter (Signed)
i agree, thanks 

## 2012-02-12 NOTE — Telephone Encounter (Signed)
error 

## 2012-03-13 ENCOUNTER — Telehealth: Payer: Self-pay

## 2012-03-13 NOTE — Telephone Encounter (Signed)
Do you know where patient was referred for her MS?

## 2012-03-13 NOTE — Telephone Encounter (Signed)
PT STATES SHE HAVE TO GO HAVE STEROIDS INJECTION AND HAVING A PROBLEM NOT KNOWING WHERE TO GO WOULD LIKE TO SPEAK WITH SOMEONE. PLEASE CALL [phone]

## 2012-03-13 NOTE — Telephone Encounter (Signed)
Patient came in at 8:30pm when we closed, she went to the bathroom with her cell phone then came out after she was done, trying to sign in. She know's what time we close, she was told several times. She insisted she wanted to see Dr. Milus Glazier for a cough. She was told to go to the ER if it was emergent. She understood and was going to try and catch him tomorrow. Are we treating this patient still? She was turned away halloween night 2013 when she came in wanting just pain medication after we closed that night. I'm just wondering because she has a habit of calling our office and doing the same behaviors; coming in after we close. Just concerned because she seemed like she was under the influence of something. I felt like it needed to be documented.

## 2012-03-14 ENCOUNTER — Telehealth: Payer: Self-pay

## 2012-03-16 ENCOUNTER — Emergency Department (HOSPITAL_COMMUNITY)
Admission: EM | Admit: 2012-03-16 | Discharge: 2012-03-16 | Disposition: A | Discharge status: Home / Self Care | Payer: Medicare Other

## 2012-03-16 NOTE — ED Notes (Signed)
Pt came to the window at 0730, per night shift RN pt had been in and out of the ED since 0615 asking questions and trying to decide whether or not to check in. Pt reported to this nurse that she was having "MS problems" and needed steroids but had to be somewhere at 0900. Pt also reported that she had been here before for a psych eval and asked how long that would take. This nurse informed pt that to be evaluated in ED for MS or psych could take a few hours. Pt denied SI/HI. Pt then decided to check in but when Registration attempted to place ID bracelet on, pt reported that she had to go to her car for her clothes. Pt was informed more than once that after checking in, she was not allowed to leave the ED; however, pt proceeded to leave. Diane, Consulting civil engineer informed of situation and instructed this nurse to dismiss pt.

## 2012-03-19 ENCOUNTER — Encounter: Payer: BC Managed Care – PPO | Admitting: Internal Medicine

## 2012-03-19 ENCOUNTER — Telehealth: Payer: Self-pay

## 2012-03-19 NOTE — Telephone Encounter (Signed)
PT ARRIVED AT 653 INSISTED ON GOING TO THE BATHROOM BEFORE SIGNING IN -WAIVERED BACK AND FORTH AS TO WHETHER SHE WAS GOING TO BE SEEN .  REQUEST STEROID SHOTS.  THEN STATED SHE HAD A SORE THROAT NEVER SIGNED IN -ATTEMPTED TO CHECK HER IN AT 607 AND SHE DID NOT HAVE INSURANCE CARD AND WAS TOLD WE NEEDED IT TO PROCESS CHECK IN LEFT THE BUILDING WHEN SHE RETURNED A PATIENT IN [place] LET HER IN BETWEEN [phone] THEN WAIVERED BACK AND FORTH AGAIN UNTIL 640 BEFORE WE CHECKED HER IN THIS IS A PATTERN THAT HAS REPEATED IT SELF SINCE October THIS IS INFORMATION IN HER CHART FROM November    THIS PATIENT REPEATEDLY COMES IN AT CLOSING AND WAIVERS AS TO WHETHER SHE WILL BE SEEN OR NOT

## 2012-03-19 NOTE — Progress Notes (Signed)
  Subjective:    Patient ID: Anne Black, female    DOB: [birth year], 52 y.o.   MRN: [id number]  HPI    Review of Systems     Objective:   Physical Exam        Assessment & Plan:   This encounter was created in error - please disregard.

## 2012-03-20 NOTE — Telephone Encounter (Signed)
Please also see multiple documentations of phone messages and OV notes. I am forwarding this to Dr Nilda Simmer and Sammuel Cooper for review.

## 2012-03-22 ENCOUNTER — Emergency Department (HOSPITAL_COMMUNITY)
Admission: EM | Admit: 2012-03-22 | Discharge: 2012-03-23 | Disposition: A | Discharge status: Home / Self Care | Payer: Medicare Other | Attending: Emergency Medicine | Admitting: Emergency Medicine

## 2012-03-22 ENCOUNTER — Emergency Department (HOSPITAL_COMMUNITY): Payer: Medicare Other

## 2012-03-22 ENCOUNTER — Encounter (HOSPITAL_COMMUNITY): Payer: Self-pay

## 2012-03-22 DIAGNOSIS — M129 Arthropathy, unspecified: Secondary | ICD-10-CM | POA: Insufficient documentation

## 2012-03-22 DIAGNOSIS — F909 Attention-deficit hyperactivity disorder, unspecified type: Secondary | ICD-10-CM | POA: Insufficient documentation

## 2012-03-22 DIAGNOSIS — Z862 Personal history of diseases of the blood and blood-forming organs and certain disorders involving the immune mechanism: Secondary | ICD-10-CM | POA: Insufficient documentation

## 2012-03-22 DIAGNOSIS — R1013 Epigastric pain: Secondary | ICD-10-CM | POA: Insufficient documentation

## 2012-03-22 DIAGNOSIS — R059 Cough, unspecified: Secondary | ICD-10-CM | POA: Insufficient documentation

## 2012-03-22 DIAGNOSIS — Z8639 Personal history of other endocrine, nutritional and metabolic disease: Secondary | ICD-10-CM | POA: Insufficient documentation

## 2012-03-22 DIAGNOSIS — J029 Acute pharyngitis, unspecified: Secondary | ICD-10-CM | POA: Insufficient documentation

## 2012-03-22 DIAGNOSIS — F319 Bipolar disorder, unspecified: Secondary | ICD-10-CM | POA: Insufficient documentation

## 2012-03-22 DIAGNOSIS — F17200 Nicotine dependence, unspecified, uncomplicated: Secondary | ICD-10-CM | POA: Insufficient documentation

## 2012-03-22 DIAGNOSIS — R110 Nausea: Secondary | ICD-10-CM | POA: Insufficient documentation

## 2012-03-22 DIAGNOSIS — F411 Generalized anxiety disorder: Secondary | ICD-10-CM | POA: Insufficient documentation

## 2012-03-22 DIAGNOSIS — IMO0002 Reserved for concepts with insufficient information to code with codable children: Secondary | ICD-10-CM | POA: Insufficient documentation

## 2012-03-22 DIAGNOSIS — Z79899 Other long term (current) drug therapy: Secondary | ICD-10-CM | POA: Insufficient documentation

## 2012-03-22 DIAGNOSIS — G35 Multiple sclerosis: Secondary | ICD-10-CM | POA: Insufficient documentation

## 2012-03-22 DIAGNOSIS — R05 Cough: Secondary | ICD-10-CM | POA: Insufficient documentation

## 2012-03-22 DIAGNOSIS — Z9889 Other specified postprocedural states: Secondary | ICD-10-CM | POA: Insufficient documentation

## 2012-03-22 LAB — CBC WITH DIFFERENTIAL/PLATELET
Basophils Relative: 1 % (ref 0–1)
Hemoglobin: 13 g/dL (ref 12.0–15.0)
Lymphocytes Relative: 27 % (ref 12–46)
Lymphs Abs: 1.7 10*3/uL (ref 0.7–4.0)
Monocytes Relative: 12 % (ref 3–12)
Neutro Abs: 3.5 10*3/uL (ref 1.7–7.7)
Neutrophils Relative %: 58 % (ref 43–77)
RBC: 4.02 MIL/uL (ref 3.87–5.11)

## 2012-03-22 LAB — BASIC METABOLIC PANEL
BUN: 7 mg/dL (ref 6–23)
CO2: 22 mEq/L (ref 19–32)
Chloride: 99 mEq/L (ref 96–112)
GFR calc Af Amer: >90 mL/min (ref >90)
Glucose, Bld: 91 mg/dL (ref 70–99)
Potassium: 3.4 mEq/L — ABNORMAL LOW (ref 3.5–5.1)

## 2012-03-22 MED ORDER — METHYLPREDNISOLONE SODIUM SUCC 125 MG IJ SOLR
125.0000 mg | Freq: Once | INTRAMUSCULAR | Status: AC
  Administered 2012-03-23: 125 mg via INTRAVENOUS
  Filled 2012-03-22: qty 2

## 2012-03-22 MED ORDER — GI COCKTAIL ~~LOC~~
30.0000 mL | Freq: Once | ORAL | Status: AC
  Administered 2012-03-22: 30 mL via ORAL
  Filled 2012-03-22: qty 30

## 2012-03-22 NOTE — ED Notes (Signed)
Pt reports multiple complaints at this time.  States that her PCP recommended her to have a scope for her GERD, but this is resolving with Nexium.  Pt states she has a cough with ear pain that has been present x2 months. Pt states she has also been recommended to have a steroid injection and needs multiple refills for her medications.  NAD noted at this time.

## 2012-03-22 NOTE — ED Notes (Signed)
Pt states her neurologist wanted her to have steroid injection.  States she was seen by him 3 weeks ago and has since spoke to RN recommending her to be seen in ED.  Pt also c/o sore throat and difficulty swallowing x 2 months.  Has seen GI doctor and is on nexium.  Pt states some pain to rt ear and cough also.

## 2012-03-22 NOTE — ED Notes (Addendum)
Pt was at the window asking if she will be admitted if she checks in, if not then she is "not sure" whether or not she needs to be seen.  Pt sts that her doctor, Dr.Potter referred her so she can have a steroid injection due to her MS and adjust her medication.  Writer informed her that we are not sure what the ERP is going to do. Writer informed pt that the RN's and the ERP would have to assess her. Pt is still unsure whether or not she wants to be seen.  Pt wants to be admitted b/c she doesn't want to be leaving in the middle of the night, with the weather like this. Pt sts that her GI doctors wants her to admitted, pt is very adamant about being admitted tonight.  Writer informed her that will be up to our ERP.

## 2012-03-22 NOTE — ED Provider Notes (Signed)
History     CSN: [id number]  Arrival date & time 03/22/12  2029   First MD Initiated Contact with Patient 03/22/12 2050      Chief Complaint  Patient presents with  . Multiple Sclerosis  . Sore Throat    (Consider location/radiation/quality/duration/timing/severity/associated sxs/prior treatment) HPI History provided by pt.   Pt a poor historian and has multiple comlaints.  She reports that she was referred to the ED by her Dr. Malvin Johns, her neurologist in [place], for IV solumedrol and an MRI of her brain, because she is experiencing an MS exacerbation.  She did not talk to him directly, but one of his nurses, an unknown amount of time ago but w/in the past week, and they were adamant about her coming to ED.  She most recently saw him ~1 month ago.  She is not on steroids at home.  Her mentation is slower than her baseline.  Denies headache, vision changes and dizziness.  She has been incontinent of stool on "a few" occasions, in the past month, but not in the last week.  Denies neck and low back pain, urinary sx and extremity weakness/paresthesias.  Has had a sore throat for the past 3 weeks that has improved in the past w/ a GI cocktail, and for which she is currently taking nexium, prescribed by her gastroenterologist.  On a few occasions, most recently a few days ago, she has had a random sensation that her throat is closing, that lasts for less than a minute, resolving w/ drinking water.  She has had an intermittent cough but denies fever, CP and SOB.  She has intermittent upper abdominal spasms as well as nausea, but has not had vomiting or diarrhea.  Denies myalgias/arthralgias.  Has had anxiety attacks but they are not quite as frequent as normal.  She has had suicidal ideation in the past, but she can not say when nor elaborate on these thoughts.  She is compliant w/ all medications.  She has been going through a lot of life changes recently, losing custody of her children, and this may  be what triggered her exacerbation.  Per prior chart, pt has an extensive psych history, for which she has been admitted in the past.  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  Past Medical History  Diagnosis Date  . Multiple sclerosis   . Arthritis   . Anxiety   . Bipolar disorder   . ADHD (attention deficit hyperactivity disorder)     Past Surgical History  Procedure Laterality Date  . Cesarean section      History reviewed. No pertinent family history.  History  Substance Use Topics  . Smoking status: Current Every Day Smoker -- 1.00 packs/day for 20 years    Types: Cigarettes  . Smokeless tobacco: Never Used  . Alcohol Use: 2.0 oz/week    4 drink(s) per week     Comment: occasional    OB History   Grav Para Term Preterm Abortions TAB SAB Ect Mult Living                  Review of Systems  All other systems reviewed and are negative.    Allergies  Review of patient's allergies indicates no known allergies.  Home Medications   Current Outpatient Rx  Name  Route  Sig  Dispense  Refill  . albuterol (PROVENTIL HFA;VENTOLIN HFA) 108 (90 BASE) MCG/ACT inhaler   Inhalation   Inhale 2 puffs into  the lungs every 6 (six) hours as needed for wheezing.         . Armodafinil (NUVIGIL PO)   Oral   Take by mouth every morning.         Marland Kitchen buPROPion (WELLBUTRIN XL) 150 MG 24 hr tablet   Oral   Take 1 tablet (150 mg total) by mouth daily.   30 tablet   0   . DULoxetine (CYMBALTA) 30 MG capsule   Oral   Take 1 capsule (30 mg total) by mouth daily.   90 capsule   2   . esomeprazole (NEXIUM) 40 MG capsule   Oral   Take 1 capsule (40 mg total) by mouth daily before breakfast.   30 capsule   11   . Fluticasone-Salmeterol (ADVAIR) 250-50 MCG/DOSE AEPB   Inhalation   Inhale 1 puff into the lungs every 12 (twelve) hours.         . gabapentin (NEURONTIN) 300 MG capsule   Oral   Take 300 mg by mouth 3 (three)  times daily as needed (pain).          Marland Kitchen glatiramer (COPAXONE) 20 MG/ML injection   Subcutaneous   Inject 10-20 mg into the skin daily.         Marland Kitchen lisdexamfetamine (VYVANSE) 50 MG capsule   Oral   Take 1 capsule (50 mg total) by mouth every morning.   30 capsule   0   . meloxicam (MOBIC) 15 MG tablet   Oral   Take 15 mg by mouth daily.         . potassium chloride (K-DUR) 10 MEQ tablet   Oral   Take 20 mEq by mouth daily as needed (cramps).          . risperiDONE (RISPERDAL) 1 MG tablet   Oral   Take 1 mg by mouth daily.            BP 133/84  Pulse 79  Temp(Src) 98.1 F (36.7 C) (Oral)  Resp 21  SpO2 95%  Physical Exam  Nursing note and vitals reviewed. Constitutional: She is oriented to person, place, and time. She appears well-developed and well-nourished. No distress.  HENT:  Head: Normocephalic and atraumatic.  Mouth/Throat: Oropharynx is clear and moist. No oropharyngeal exudate.  No injection of soft palate or posterior pharynx  Eyes:  Normal appearance  Neck: Normal range of motion. Neck supple.  Cardiovascular: Normal rate, regular rhythm and intact distal pulses.   Pulmonary/Chest: Effort normal and breath sounds normal. No stridor.  Abdominal: Soft. Bowel sounds are normal. She exhibits no distension.  Mild epigastric ttp  Musculoskeletal: Normal range of motion.  Lymphadenopathy:    She has no cervical adenopathy.  Neurological: She is alert and oriented to person, place, and time. No sensory deficit. Coordination normal.  CN 3-12 intact.  No nystagmus. 5/5 and equal upper and lower extremity strength.  No past pointing.     Skin: Skin is warm and dry. No rash noted.  Psychiatric: Her behavior is normal.  Pt seems mildly paranoid and untrusting.  Her responses are mildly delayed.    ED Course  Procedures (including critical care time)  Labs Reviewed  BASIC METABOLIC PANEL - Abnormal; Notable for the following:    Sodium 133 (*)     Potassium 3.4 (*)    Creatinine, Ser 0.44 (*)    All other components within normal limits  CBC WITH DIFFERENTIAL  URINALYSIS, ROUTINE W REFLEX MICROSCOPIC  Dg Chest 2 View  03/22/2012  *RADIOLOGY REPORT*  Clinical Data: Cough  CHEST - 2 VIEW  Comparison: 11/28/2009  Findings: Cardiac and mediastinal contours appear normal.  The lungs appear clear.  No pleural effusion is identified.  IMPRESSION:  No significant abnormality identified.   Original Report Authenticated By: Gaylyn Rong, M.D.      1. Cough   2. Sore throat       MDM  51yo F w/ h/o MS, bipolar disorder and anxiety (past psych admissions) presents w/ multiple complaints:   1-Intermittent cough x 1 month w/out fever, CP or SOB.  CXR neg.   2-Sore throat x several weeks.  Nml ENT and no lymphadenopathy to suggest infectious process on exam.  Relief w/ GI cocktail.  Likely d/t GERD.   3-A few episodes of "throat closing" that occurs randomly and resolves w/in a minute with drinking water; most recently a few days ago.  Pt is able to swallow and no stridor on exam.  She reports anxiety attacks w/ tachycardia as well and I suspect that her "throat closing" sensation is psychiatric.   4 (chief complaint)-Neurologist from [facility] sent her for IV solumedrol and MRI of brain because she is having an MS exacerbation.  She spoke to his nurse, sometime w/in the last week, and her most recent visit with him is unclear.  Initially reported 1 month ago and later said she's seen him twice in past month.  She is not on maintenance steroids.  Reported MS sx include slowed cognition as well as "a few" episodes of stool incontinence, but not in the last week.  Has not had any myalgias/arthralgias, including neck/back pain, and denies headache, vision changes and other neurologic sx.  Pt is very adamant about being admitted and states both that this is what her doctor told her would occur, and that she doesn't want to drive home this late at  night in bad weather.  I sat down at bedside and explained to patient that her symptoms were not consistent w/ an MS exacerbation and that there was no indication for admission nor MRI.  She was argumentative, said that she couldn't build a repoire with me, that I was rude for having my arms crossed and that our facility is inconsistent with our care since she was admitted for these complaints last time.  I explained that her most recent admission was d/t hyponatremia, not MS, and that her labs were nml today, but she continued to insist that she should stay and be treated.  In order to pacify her, I gave her 125mg  IV solumedrol.  Then she stated that she had not received enough and refused to leave.  I spoke with her again and politely repeated the reasons she would not be treated further here today, and that she should f/u with her neurologist asap.  [employer] asked her to leave.   ROS revealed that pt has had suicidal thoughts in the past but she "doesn't think" she has had any recently and they are "nothing she would ever act on".  I do not get the impression that patient is suicidal and she denies HI.   All test results as well as return precautions were discussed w/ patient.  Patient stable for discharge.  Nursing staff present for all conversations. 1:59 AM        Otilio Miu, PA-C 03/23/12 0200  Otilio Miu, PA-C 03/23/12 [phone]

## 2012-03-23 MED ORDER — SODIUM CHLORIDE 0.9 % IV SOLN
Freq: Once | INTRAVENOUS | Status: AC
  Administered 2012-03-23: 500 mL via INTRAVENOUS

## 2012-03-23 NOTE — ED Notes (Signed)
Pt. Made aware for the need of urine. 

## 2012-03-23 NOTE — ED Notes (Signed)
Spoke with PA about pt's urinalysis.  PA stated that since pt is not c/o abdominal discomfort and does not have a fever, pt does not have symptoms of an infection and the urinalysis is not required at this time.

## 2012-03-24 NOTE — ED Provider Notes (Signed)
Medical screening examination/treatment/procedure(s) were performed by non-physician practitioner and as supervising physician I was immediately available for consultation/collaboration.  Faizah Kandler R. Keionna Kinnaird, MD 03/24/12 0655 

## 2012-05-01 ENCOUNTER — Encounter: Payer: Self-pay | Admitting: Family

## 2012-05-01 ENCOUNTER — Ambulatory Visit (INDEPENDENT_AMBULATORY_CARE_PROVIDER_SITE_OTHER): Payer: Medicare Other | Admitting: Family

## 2012-05-01 VITALS — BP 96/52 | HR 81 | Wt 136.0 lb

## 2012-05-01 DIAGNOSIS — F329 Major depressive disorder, single episode, unspecified: Secondary | ICD-10-CM

## 2012-05-01 DIAGNOSIS — K219 Gastro-esophageal reflux disease without esophagitis: Secondary | ICD-10-CM

## 2012-05-01 DIAGNOSIS — F32A Depression, unspecified: Secondary | ICD-10-CM | POA: Insufficient documentation

## 2012-05-01 DIAGNOSIS — F319 Bipolar disorder, unspecified: Secondary | ICD-10-CM

## 2012-05-01 DIAGNOSIS — G35 Multiple sclerosis: Secondary | ICD-10-CM

## 2012-05-01 DIAGNOSIS — G609 Hereditary and idiopathic neuropathy, unspecified: Secondary | ICD-10-CM

## 2012-05-01 DIAGNOSIS — M1710 Unilateral primary osteoarthritis, unspecified knee: Secondary | ICD-10-CM | POA: Insufficient documentation

## 2012-05-01 DIAGNOSIS — F988 Other specified behavioral and emotional disorders with onset usually occurring in childhood and adolescence: Secondary | ICD-10-CM

## 2012-05-01 MED ORDER — DULOXETINE HCL 30 MG PO CPEP: 30.0000 mg | ORAL_CAPSULE | Freq: Every day | ORAL | Status: DC

## 2012-05-01 MED ORDER — ARMODAFINIL 150 MG PO TABS: 150.0000 mg | ORAL_TABLET | Freq: Every day | ORAL | Status: DC

## 2012-05-01 MED ORDER — LISDEXAMFETAMINE DIMESYLATE 50 MG PO CAPS: 50.0000 mg | ORAL_CAPSULE | ORAL | Status: DC

## 2012-05-01 MED ORDER — MELOXICAM 15 MG PO TABS: 15.0000 mg | ORAL_TABLET | Freq: Every day | ORAL | Status: DC

## 2012-05-01 MED ORDER — FLUOXETINE HCL 20 MG PO CAPS: 20.0000 mg | ORAL_CAPSULE | Freq: Every day | ORAL | Status: DC

## 2012-05-01 NOTE — Patient Instructions (Addendum)
Must see psychiatry...  Return for CPX   Fibromyalgia Fibromyalgia is a disorder that is often misunderstood. It is associated with muscular pains and tenderness that comes and goes. It is often associated with fatigue and sleep disturbances. Though it tends to be long-lasting, fibromyalgia is not life-threatening. CAUSES  The exact cause of fibromyalgia is unknown. People with certain gene types are predisposed to developing fibromyalgia and other conditions. Certain factors can play a role as triggers, such as:  Spine disorders.  Arthritis.  Severe injury (trauma) and other physical stressors.  Emotional stressors. SYMPTOMS   The main symptom is pain and stiffness in the muscles and joints, which can vary over time.  Sleep and fatigue problems. Other related symptoms may include:  Bowel and bladder problems.  Headaches.  Visual problems.  Problems with odors and noises.  Depression or mood changes.  Painful periods (dysmenorrhea).  Dryness of the skin or eyes. DIAGNOSIS  There are no specific tests for diagnosing fibromyalgia. Patients can be diagnosed accurately from the specific symptoms they have. The diagnosis is made by determining that nothing else is causing the problems. TREATMENT  There is no cure. Management includes medicines and an active, healthy lifestyle. The goal is to enhance physical fitness, decrease pain, and improve sleep. HOME CARE INSTRUCTIONS   Only take over-the-counter or prescription medicines as directed by your caregiver. Sleeping pills, tranquilizers, and pain medicines may make your problems worse.  Low-impact aerobic exercise is very important and advised for treatment. At first, it may seem to make pain worse. Gradually increasing your tolerance will overcome this feeling.  Learning relaxation techniques and how to control stress will help you. Biofeedback, visual imagery, hypnosis, muscle relaxation, yoga, and meditation are all  options.  Anti-inflammatory medicines and physical therapy may provide short-term help.  Acupuncture or massage treatments may help.  Take muscle relaxant medicines as suggested by your caregiver.  Avoid stressful situations.  Plan a healthy lifestyle. This includes your diet, sleep, rest, exercise, and friends.  Find and practice a hobby you enjoy.  Join a fibromyalgia support group for interaction, ideas, and sharing advice. This may be helpful. SEEK MEDICAL CARE IF:  You are not having good results or improvement from your treatment. FOR MORE INFORMATION  National Fibromyalgia Association: www.fmaware.org Arthritis Foundation: www.arthritis.org Document Released: 01/25/2005 Document Revised: 04/19/2011 Document Reviewed: 05/07/2009 [facility] Patient Information 2013 [place], [place].

## 2012-05-02 DIAGNOSIS — F319 Bipolar disorder, unspecified: Secondary | ICD-10-CM | POA: Insufficient documentation

## 2012-05-02 NOTE — Progress Notes (Signed)
Subjective:    Patient ID: Anne Black, female    DOB: February 26, 1960, 52 y.o.   MRN: [id number]  HPI 52 year old WF, smoker, new patient is in to be established. She has a history of bipolar disorder, depression, anxiety, ADD, Narcolepsy, Multiple Sclerosis, GERD, and Fibromyalgia. She is currently seeing a neurologist at [facility]. She has seen Dr. Lafayette Dragon, psychiatry in the pat but is no longer seeing a psychiatrist. She has been attempting to find a PCP who is willing to manage her psychiatric ailments as well as her primary care. She continues to have anxiety and depression that is uncontrolled. Is currently taking Cymbalta that helps the fibromyalgia pain but doesn't help her moods. Does not believe she is necessarily Bipolar so does not take Risperdal as directed. Reports going through a custody battle with her ex-husband who currently has custody of her twin girls. She is requesting that I write her a note suggesting she is mentally stable but has difficulty completing task due to medication adjustments. Patient wishes to go back on Prozac and Wellbutrin. Reports it worked well in the past.  Review of Systems  Constitutional: Negative.   HENT: Negative.   Eyes: Negative.   Respiratory: Negative.   Cardiovascular: Negative.   Gastrointestinal: Negative.   Endocrine: Negative.   Genitourinary: Negative.   Musculoskeletal: Positive for arthralgias.  Allergic/Immunologic: Negative.   Neurological: Negative.        "Neuropathic pain"  Hematological: Negative.   Psychiatric/Behavioral: Positive for confusion and agitation. Negative for suicidal ideas and self-injury. The patient is nervous/anxious and is hyperactive.    Past Medical History  Diagnosis Date  . Multiple sclerosis   . Arthritis   . Anxiety   . Bipolar disorder   . ADHD (attention deficit hyperactivity disorder)     History   Social History  . Marital Status: Divorced    Spouse Name: N/A    Number of  Children: 5  . Years of Education: N/A   Occupational History  .      on social security disability for MS   Social History Main Topics  . Smoking status: Current Every Day Smoker -- 1.00 packs/day for 20 years    Types: Cigarettes  . Smokeless tobacco: Never Used  . Alcohol Use: 2.0 oz/week    4 drink(s) per week     Comment: occasional  . Drug Use: No  . Sexually Active: Not Currently   Other Topics Concern  . Not on file   Social History Narrative  . No narrative on file    Past Surgical History  Procedure Laterality Date  . Cesarean section      No family history on file.  No Known Allergies  Current Outpatient Prescriptions on File Prior to Visit  Medication Sig Dispense Refill  . albuterol (PROVENTIL HFA;VENTOLIN HFA) 108 (90 BASE) MCG/ACT inhaler Inhale 2 puffs into the lungs every 6 (six) hours as needed for wheezing.      Marland Kitchen buPROPion (WELLBUTRIN XL) 150 MG 24 hr tablet Take 1 tablet (150 mg total) by mouth daily.  30 tablet  0  . esomeprazole (NEXIUM) 40 MG capsule Take 1 capsule (40 mg total) by mouth daily before breakfast.  30 capsule  11  . Fluticasone-Salmeterol (ADVAIR) 250-50 MCG/DOSE AEPB Inhale 1 puff into the lungs every 12 (twelve) hours.      . gabapentin (NEURONTIN) 300 MG capsule Take 300 mg by mouth 3 (three) times daily as needed (pain).       Marland Kitchen  glatiramer (COPAXONE) 20 MG/ML injection Inject 10-20 mg into the skin daily.      . potassium chloride (K-DUR) 10 MEQ tablet Take 20 mEq by mouth daily as needed (cramps).       . risperiDONE (RISPERDAL) 1 MG tablet Take 1 mg by mouth daily.        No current facility-administered medications on file prior to visit.    BP 96/52  Pulse 81  Wt 136 lb (61.689 kg)  BMI 25.28 kg/m2  SpO2 97%chart    Objective:   Physical Exam  Constitutional: She is oriented to person, place, and time. She appears well-developed and well-nourished.  Appears spacey   HENT:  Right Ear: External ear normal.  Left  Ear: External ear normal.  Nose: Nose normal.  Mouth/Throat: Oropharynx is clear and moist.  Neck: Normal range of motion. Neck supple. No thyromegaly present.  Cardiovascular: Normal rate, regular rhythm and normal heart sounds.   Pulmonary/Chest: Effort normal and breath sounds normal.  Musculoskeletal: Normal range of motion.  Neurological: She is alert and oriented to person, place, and time.  Skin: Skin is warm and dry.  Psychiatric: She has a normal mood and affect.  Scattered thought, difficulty concentrating and maintaining a specific subject.           Assessment & Plan:  Assessment:   1. Bipolar disorder-unstable 2. GERD 3. Fibromyalgia 4. ADD 5. Multiple Sclerosis 6. Anxiety  Plan: Patient is obviously unstable and scatter brained. She needs to see a psychiatrist to stabalize. I will continue Cymbalta 30mg  and low dose Prozac 10mg . I will refill Nuvigil and Vyvanse x 1 month only. I have advised patient that I believe that she is unstable and I am uncomfortable writing a letter on her behalf suggesting she is unstable due to meds. I have literally just met her today and she definitely has complex psychiatric issues requiring a specialist evaluation.I will not manage her pschiatric issues after today.  I did discuss her case with Dr. Kirtland Bouchard and he agrees, she needs to see psych and he will not see her for her psychiatric needs either. Recheck here for a CPX and all other medical needs.

## 2012-05-12 ENCOUNTER — Telehealth: Payer: Self-pay | Admitting: Family

## 2012-05-12 NOTE — Telephone Encounter (Signed)
TC to patient for triage.  On callback, RN unable to reach patient; message left on unidentified voicemail to call office.  krs/can

## 2012-05-15 ENCOUNTER — Telehealth: Payer: Self-pay | Admitting: Family

## 2012-05-15 MED ORDER — RISPERIDONE 1 MG PO TABS: 1.0000 mg | ORAL_TABLET | Freq: Every day | ORAL | Status: DC

## 2012-05-15 NOTE — Telephone Encounter (Signed)
Please advise 

## 2012-05-15 NOTE — Telephone Encounter (Signed)
[facility] Triage Call Report Triage Record Num: [id number] Operator: Donnella Sham Patient Name: Anne Black Call Date & Time: 05/12/2012 5:58:04PM Patient Phone: [phone] PCP: Adline Mango Patient Gender: Female PCP Fax : Patient DOB: February 12, 1960 [facility] Reason for Call: Caller: Lasonja/Patient; PCP: Adline Mango (Family Practice); CB#: [phone]; Call regarding Medication request for Risperdal; stated she had already talked to a nurse; checked cecc notes and found that Donell had already been told that Risperdal could not be called in because it wasn't a current rx; checked epic and told her the same thing; informed her that new rx needed to be dealt with during office hrs Protocol(s) Used: Office Note Recommended Outcome per Protocol: Information Noted and Sent to Office Reason for Outcome: Caller information to office Care Advice: ~ 04/

## 2012-05-15 NOTE — Telephone Encounter (Signed)
Sent in to pharmacy.  

## 2012-05-15 NOTE — Telephone Encounter (Signed)
Caller: Anne Black/Patient; Phone: [phone]; Reason for Call: Pt calling about resperidone, advised that Rx was at pharmacy per chart.  Pt experiencing a hard time coping w/current situation.  Didn't express any sucidal ideation, only that she was scared and wanted help.  Talked about taking herself to the ED.  Talked at length about not coping well, and being afraid/anxious.  Refused nurse triage.  Caller thanked Designer, television/film set for listening.

## 2012-05-15 NOTE — Telephone Encounter (Signed)
[facility] Triage Call Report Triage Record Num: [id number] Operator: Kelle Darting Patient Name: Anne Black Call Date & Time: 05/12/2012 5:04:32PM Patient Phone: [phone] PCP: Adline Mango Patient Gender: Female PCP Fax : Patient DOB: 12-11-1960 [facility] Reason for Call: Caller: [name]/Patient; PCP: Adline Mango, PA; CB#: [phone]; Call regarding Anxiety, wanting some pill that was 10mg  called in; Note: Has had several anxiety attacks this week, also having night mares, having a lot of difficult things going on; at this time patient feels sad, scared, having a hard time adjusting well, unsure how to do differently; When asked if she is having any thoughts of hurting herself and she starts describing how she feels when she wakes up from a night mare, also how frustrated she is due to a legal battle, states that she knows that she can call mobile crisis or go to [facility] behavioral health; states she does not feel suicidal right now, states she is home alone a lot, just having a hard time processing everything; this has gone on for 8 months, has been in different places regarding her psychiatric status; Unable to fully triage patient per Anxiety : Panic Guideline due to patient states "don't waste my time then", after I told her that I could not call something in that is "calming" for her, nothing was found in her Epic chart for Anxiety and she does not know the name, only that it was 10mg , then was asking for Risperdal that she had gotten in the hospital one time; Patient hung up; Note to office. Protocol(s) Used: Office Note Recommended Outcome per Protocol: Information Noted and Sent to Office Reason for Outcome: Caller information to office Care Advice: ~ 04/04/

## 2012-05-16 NOTE — Telephone Encounter (Signed)
noted 

## 2012-05-25 ENCOUNTER — Telehealth: Payer: Self-pay | Admitting: Family

## 2012-05-25 NOTE — Telephone Encounter (Addendum)
Pt needs new rx vyvanse 30 mg. Pt would like something (med) to calm her down. Pt was offered appt and decline. [facility] [place]

## 2012-05-29 MED ORDER — LISDEXAMFETAMINE DIMESYLATE 50 MG PO CAPS: 50.0000 mg | ORAL_CAPSULE | ORAL | Status: DC

## 2012-05-29 NOTE — Telephone Encounter (Signed)
Rx completed

## 2012-06-06 ENCOUNTER — Other Ambulatory Visit: Payer: Self-pay | Admitting: Family

## 2012-06-07 ENCOUNTER — Other Ambulatory Visit: Payer: Self-pay | Admitting: Family

## 2012-06-23 ENCOUNTER — Telehealth: Payer: Self-pay | Admitting: Family

## 2012-06-23 NOTE — Telephone Encounter (Signed)
Will do thank you

## 2012-06-23 NOTE — Telephone Encounter (Signed)
Just advise patient that insurance is not going to approve it. We can talk about it next ov. She has a lot going on!

## 2012-06-23 NOTE — Telephone Encounter (Signed)
Patient's tretinoin gel requires prior auth. However, I cannot find anything in the chart other then the refill to indicate need. I assume it is acne related, but have no supporting documentation to attempt PA. Please advise or advise if we can try Differin, as this MAY go through without PA.

## 2012-06-28 ENCOUNTER — Other Ambulatory Visit: Payer: Self-pay | Admitting: Family

## 2012-06-30 ENCOUNTER — Telehealth: Payer: Self-pay | Admitting: Family

## 2012-06-30 NOTE — Telephone Encounter (Signed)
Called and left a message for pt to return call.  

## 2012-06-30 NOTE — Telephone Encounter (Signed)
We will not make any changes in her ADD meds in absence of her primary provider. I do not feel comfortable calling in prednisone for her MS.  Is she not followed by neurologist?

## 2012-06-30 NOTE — Telephone Encounter (Addendum)
Pt needs new rx vyvanse 50 mg. Pt was would like to know can she have vyvanse 30 mg twice a day instead of 50 mg once a day. Pt has MS and would like prednisone [facility]. Pt is aware NP out until Tuesday. Pt stated she can not wait until NP returns next wk

## 2012-06-30 NOTE — Telephone Encounter (Signed)
Pt needs refill on nuvigil

## 2012-07-04 ENCOUNTER — Other Ambulatory Visit: Payer: Self-pay

## 2012-07-04 DIAGNOSIS — G35 Multiple sclerosis: Secondary | ICD-10-CM

## 2012-07-04 MED ORDER — ARMODAFINIL 150 MG PO TABS: ORAL_TABLET | ORAL | Status: DC

## 2012-07-04 MED ORDER — LISDEXAMFETAMINE DIMESYLATE 50 MG PO CAPS: 50.0000 mg | ORAL_CAPSULE | ORAL | Status: DC

## 2012-07-04 NOTE — Telephone Encounter (Addendum)
Pt following up on refill request. Armodafinil (NUVIGIL) 150 MG tablet lisdexamfetamine (VYVANSE) 50 MG capsule  Pt would also like a  refill of predniSONE (DELTASONE) 20 MG tablet  [facility] [place] [place]  Pls call pt when ready:  [phone]

## 2012-07-13 ENCOUNTER — Telehealth: Payer: Self-pay | Admitting: Family

## 2012-07-13 ENCOUNTER — Other Ambulatory Visit (HOSPITAL_COMMUNITY)
Admission: RE | Admit: 2012-07-13 | Discharge: 2012-07-13 | Disposition: A | Discharge status: Home / Self Care | Payer: Self-pay | Source: Ambulatory Visit | Attending: Family | Admitting: Family

## 2012-07-13 ENCOUNTER — Ambulatory Visit (INDEPENDENT_AMBULATORY_CARE_PROVIDER_SITE_OTHER): Payer: Medicare Other | Admitting: Family

## 2012-07-13 ENCOUNTER — Encounter: Payer: Self-pay | Admitting: Family

## 2012-07-13 VITALS — BP 100/58 | HR 87 | Ht 62.25 in | Wt 137.0 lb

## 2012-07-13 DIAGNOSIS — F319 Bipolar disorder, unspecified: Secondary | ICD-10-CM

## 2012-07-13 DIAGNOSIS — G35 Multiple sclerosis: Secondary | ICD-10-CM

## 2012-07-13 DIAGNOSIS — Z1211 Encounter for screening for malignant neoplasm of colon: Secondary | ICD-10-CM

## 2012-07-13 DIAGNOSIS — Z01419 Encounter for gynecological examination (general) (routine) without abnormal findings: Secondary | ICD-10-CM | POA: Insufficient documentation

## 2012-07-13 DIAGNOSIS — Z0000 Encounter for general adult medical examination without abnormal findings: Secondary | ICD-10-CM

## 2012-07-13 DIAGNOSIS — Z124 Encounter for screening for malignant neoplasm of cervix: Secondary | ICD-10-CM

## 2012-07-13 MED ORDER — METHYLPREDNISOLONE 4 MG PO KIT: PACK | ORAL | Status: AC

## 2012-07-13 NOTE — Telephone Encounter (Signed)
Upon check out today, pt requesting appointment for steroid injection.  I informed pt that I did not see anything listed on her AVS stating she needed an appointment for a steroid injection only a 6 mnth rov.  Pt declined to schedule 6 mnth rov but wants to come in for injection.  Does pt need this appointment?  Patient also requesting rx for Vyvance be written now and post dated for the end of the month.  I advised pt we would not be able to do that however she requested her PCP be asked instead.

## 2012-07-13 NOTE — Patient Instructions (Signed)
1. See psychiatry 2. Consider counseling for depression 3. Follow-up with neurology for MS and pain

## 2012-07-13 NOTE — Progress Notes (Signed)
Subjective:    Patient ID: Anne Black, female    DOB: 10-Mar-1960, 52 y.o.   MRN: [id number]  HPI 52 year old white female, nonsmoker, is in for complete physical exam. She has a history of GERD, bipolar disorder, anemia, depression, and multiple sclerosis. She's under the care of neurology with regards to multiple sclerosis. She's been seeing psychiatry in the past for psychiatric medications. However, she does not believe she needs to continue to see psych. She does not see a therapist either. Continues to have difficulty coping with the loss of her daughters. The court system has ruled against her and given custody for her former husband. Often finds herself tearful and depressed.    Review of Systems  Constitutional: Negative.   HENT: Negative.   Eyes: Negative.   Respiratory: Negative.   Cardiovascular: Negative.   Gastrointestinal: Negative.   Endocrine: Negative.   Genitourinary: Negative.   Musculoskeletal: Negative.   Skin: Negative.   Allergic/Immunologic: Negative.   Neurological: Negative.   Hematological: Negative.   Psychiatric/Behavioral: Positive for suicidal ideas, decreased concentration and agitation. The patient is nervous/anxious.    Past Medical History  Diagnosis Date  . Multiple sclerosis   . Arthritis   . Anxiety   . Bipolar disorder   . ADHD (attention deficit hyperactivity disorder)     History   Social History  . Marital Status: Divorced    Spouse Name: N/A    Number of Children: 5  . Years of Education: N/A   Occupational History  .      on social security disability for MS   Social History Main Topics  . Smoking status: Current Every Day Smoker -- 1.00 packs/day for 20 years    Types: Cigarettes  . Smokeless tobacco: Never Used  . Alcohol Use: 2.0 oz/week    4 drink(s) per week     Comment: occasional  . Drug Use: No  . Sexually Active: Not Currently   Other Topics Concern  . Not on file   Social History Narrative  . No  narrative on file    Past Surgical History  Procedure Laterality Date  . Cesarean section      History reviewed. No pertinent family history.  No Known Allergies  Current Outpatient Prescriptions on File Prior to Visit  Medication Sig Dispense Refill  . albuterol (PROVENTIL HFA;VENTOLIN HFA) 108 (90 BASE) MCG/ACT inhaler Inhale 2 puffs into the lungs every 6 (six) hours as needed for wheezing.      . Armodafinil (NUVIGIL) 150 MG tablet TAKE 1 TABLET BY MOUTH EVERY DAY  30 tablet  0  . buPROPion (WELLBUTRIN XL) 150 MG 24 hr tablet TAKE 1 TABLET BY MOUTH EVERY DAY  90 tablet  0  . DULoxetine (CYMBALTA) 30 MG capsule Take 1 capsule (30 mg total) by mouth daily.  90 capsule  1  . esomeprazole (NEXIUM) 40 MG capsule Take 1 capsule (40 mg total) by mouth daily before breakfast.  30 capsule  11  . FLUoxetine (PROZAC) 20 MG capsule Take 1 capsule (20 mg total) by mouth daily.  90 capsule  1  . Fluticasone-Salmeterol (ADVAIR) 250-50 MCG/DOSE AEPB Inhale 1 puff into the lungs every 12 (twelve) hours.      . gabapentin (NEURONTIN) 300 MG capsule Take 300 mg by mouth 3 (three) times daily as needed (pain).       Marland Kitchen glatiramer (COPAXONE) 20 MG/ML injection Inject 10-20 mg into the skin daily.      Marland Kitchen lisdexamfetamine (  VYVANSE) 50 MG capsule Take 1 capsule (50 mg total) by mouth every morning.  30 capsule  0  . meloxicam (MOBIC) 15 MG tablet Take 1 tablet (15 mg total) by mouth daily.  90 tablet  1  . potassium chloride (K-DUR) 10 MEQ tablet Take 20 mEq by mouth daily as needed (cramps).       . risperiDONE (RISPERDAL) 1 MG tablet Take 1 tablet (1 mg total) by mouth daily.  30 tablet  3  . topiramate (TOPAMAX) 25 MG tablet TAKE 3 TABLETS EVERY DAY  90 tablet  0  . tretinoin (RETIN-A) 0.01 % gel APPLY TO FACE NIGHTLY  45 g  0   No current facility-administered medications on file prior to visit.    BP 100/58  Pulse 87  Ht 5' 2.25" (1.581 m)  Wt 137 lb (62.143 kg)  BMI 24.86 kg/m2  SpO2  95%chart    Objective:   Physical Exam  Constitutional: She is oriented to person, place, and time. She appears well-developed and well-nourished.  HENT:  Head: Normocephalic.  Right Ear: External ear normal.  Left Ear: External ear normal.  Nose: Nose normal.  Mouth/Throat: Oropharynx is clear and moist.  Eyes: Conjunctivae are normal. Pupils are equal, round, and reactive to light.  Neck: Normal range of motion. Neck supple. No thyromegaly present.  Cardiovascular: Normal rate, regular rhythm and normal heart sounds.   Pulmonary/Chest: Effort normal and breath sounds normal.  Abdominal: Soft. Bowel sounds are normal.  Genitourinary: Vagina normal and uterus normal. No vaginal discharge found.  Refused rectal exam. Frantic about vaginal exam.   Musculoskeletal: Normal range of motion.  Neurological: She is alert and oriented to person, place, and time. She has normal reflexes.  Skin: Skin is warm and dry.  Psychiatric:  Scattered thoughts.          Assessment & Plan:  Assessment:  1. Preventative Health Care 2. Multiple Sclerosis  3. Bipolar disorder 4. Depression  Plan: Patient is extremely anxious with scattered thoughts. She's concerned about him as not being controlled once a transfer from neurologist at [facility] to different neurologists. She's requesting intravenous steroids for which we are unable to provide here. I have explained to patient numerous times today. However, she is continuing to ask. She goes from tearful 1 minute to perfectly fine the next minute.patient needs psychiatric care as well as a therapist. I have recommended Judithe Modest for psychotherapy.

## 2012-07-14 NOTE — Telephone Encounter (Signed)
Needs to see psychiatry and neurology!! She can schedule an injection appointment, Depo Medrol 80mg .

## 2012-07-14 NOTE — Telephone Encounter (Signed)
Left detailed message to advise pt of PCP note

## 2012-07-18 ENCOUNTER — Telehealth: Payer: Self-pay | Admitting: Family

## 2012-07-18 MED ORDER — PROMETHAZINE HCL 25 MG RE SUPP: 25.0000 mg | Freq: Four times a day (QID) | RECTAL | Status: DC | PRN

## 2012-07-18 NOTE — Telephone Encounter (Signed)
Patient Information:  Caller Name: Raaga  Phone: [phone]  Patient: Anne Black, Anne Black  Gender: Female  DOB: Jul 13, 1960  Age: 52 Years  PCP: Adline Mango [facility])  Pregnant: No  Office Follow Up:  Does the office need to follow up with this patient?: Yes  Instructions For The Office: Please call back since pt is symptomatic but declined triage and did not specify what medication she wants refilled.   Symptoms  Reason For Call & Symptoms: Called to request "suppositories" be called  out for generalized stomachache with nausea and diarrhea.  Declined triage.  Mentioned Nexium ; reported has seen [facility] so we should have all the information needed in her file.  Difficult caller; she declined to "answer all these questions" when she just needs a refill.  Did not specify what medications she wants refilled and caller hung up.  Reviewed Health History In EMR: N/A  Reviewed Medications In EMR: N/A  Reviewed Allergies In EMR: N/A  Reviewed Surgeries / Procedures: N/A  Date of Onset of Symptoms: 07/18/2012  Treatments Tried: Nexium  Treatments Tried Worked: No OB / GYN:  LMP: Unknown  Guideline(s) Used:  No Protocol Available - Sick Adult  Disposition Per Guideline:   Callback by PCP or Subspecialist within 1 Hour  Reason For Disposition Reached:   Nursing judgment  Advice Given:  N/A  Patient Will Follow Care Advice:  YES

## 2012-07-18 NOTE — Telephone Encounter (Signed)
Limited supply... If no better schedule OV.

## 2012-07-18 NOTE — Telephone Encounter (Signed)
Pt aware of Padonda's note. Pt continuously speaks about a "pink medicine" that can be sent to the pharmacy but she does not know what the name of the med is. 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 I advised pt that I have spoken to [facility] about this and phenergan suppository rx was sent to pharmacy for her. Pt said to me, "Don't piss me off when I don't feel good." I tried to get pt to hear what I was saying in reference to the Rx that was sent in by [facility]. She became really upset and hung up on me.

## 2012-07-18 NOTE — Telephone Encounter (Signed)
S/w patient and she described wanting "pink medicine" for her stomach, and to be scheduled with the hospital for an IV. She also asked for [place] to direct admit her to the hospital. She repeated herself multiple times, making little sense.

## 2012-07-19 ENCOUNTER — Telehealth: Payer: Self-pay | Admitting: Family

## 2012-07-19 ENCOUNTER — Encounter: Payer: Self-pay | Admitting: Family

## 2012-07-19 NOTE — Telephone Encounter (Signed)
[facility] Triage Call Report Triage Record Num: [id number] Operator: Aundra Millet Patient Name: Anne Black Call Date & Time: 07/18/2012 5:40:35PM Patient Phone: [phone] PCP: Adline Mango Patient Gender: Female PCP Fax : Patient DOB: 06-15-1960 [facility] Reason for Call: Caller: [name]/Patient; PCP: Other; CB#: [phone]; Call regarding Abd pain; Today, 07/18/2012, pt calling stating she has stomach problems , and more bothersome today -- Abdomen pain and stomach is swollen and wanting "pink stuff " GI cocktail called in. RN attempted to assess and triage symptoms , but pt appeared reluctant to answer questions. RN then asked pt to rate her abdomen pain and she cursed and then the line disconnected. Protocol(s) Used: Office Note Recommended Outcome per Protocol: Information Noted and Sent to Office Reason for Outcome: Caller information to office Care Advice: ~ 06/

## 2012-07-20 ENCOUNTER — Telehealth: Payer: Self-pay | Admitting: Internal Medicine

## 2012-07-20 MED ORDER — AMBULATORY NON FORMULARY MEDICATION: Status: DC

## 2012-07-20 NOTE — Telephone Encounter (Signed)
Had stopped her Nexium, dyspepsia returned. Asking for GI cocktail for temporary relief. 150 cc rx Advised needed to call for follow-up for further Rx

## 2012-07-21 ENCOUNTER — Telehealth: Payer: Self-pay | Admitting: Internal Medicine

## 2012-07-21 ENCOUNTER — Telehealth: Payer: Self-pay | Admitting: Family

## 2012-07-21 NOTE — Telephone Encounter (Signed)
Patient dismissed from [facility] by Adline Mango FNP , effective July 19 2012. Dismissal letter sent out by certified / registered mail. Samson Frederic

## 2012-07-21 NOTE — Telephone Encounter (Signed)
Pt called and states that the on-call doc was supposed to send in some med last night and the pharmacy did not have it. Script printed instead of going through. Script for GI cocktail [facility] for [facility]. Pt states her stomach is hurting so bad and she needs "that procedure done" that she might just go to the ER. Told pt if she was hurting that bad she could always go to the ER. Pt stated it was "the procedure you get when you turn 50." Pt wanted to know what they would do if she went to the ER. Explained to pt that I could not tell her that, she would have to go there an be evaluated. Pt got short with me and said "you know you have been doing this for a while, what will they do?" Again explained to pt that you cannot assess someone that well over the phone and that if she was in that much pain she should go to the ER. Pt hung up on me.

## 2012-07-26 ENCOUNTER — Telehealth: Payer: Self-pay | Admitting: Family

## 2012-07-26 ENCOUNTER — Other Ambulatory Visit: Payer: Self-pay | Admitting: Internal Medicine

## 2012-07-26 ENCOUNTER — Telehealth: Payer: Self-pay | Admitting: Internal Medicine

## 2012-07-26 NOTE — Telephone Encounter (Signed)
Patient Information:  Caller Name: Karene  Phone: [phone]  Patient: Anne Black, Anne Black  Gender: Female  DOB: Apr 03, 1960  Age: 52 Years  PCP: Adline M[facility])  Pregnant: No  Office Follow Up:  Does the office need to follow up with this patient?: Yes  Instructions For The Office: FYI- Suggest Lubertha Sayres, NP be advised of this call.  RN Note:  Seeking "mild calming agent" ot help with chronic severe panic attacks.  Does not have counselor.  Reports severe depression, feels hopeless, recurrent panic attacks daily and at night related to stress invovling chronic pain from MS, loss of custody of children, legal issues. Stated her MS "has not been treated properly for years."  Not currently in counseling.  Epic shows shes been released from the practice  per letter 07/21/12 but patient did not mention it so may not have received notification.  Last office visit 07/13/12 with Lubertha Sayres noted anxiety, scattered thoughts, crying.  PA referred for counseling and neurology for MS management.  Pateint reported recurrent thoughts of death. RN advised to go to ED  now for evaluation.  Refused to go to ED;shes been seen before and "they wont help me."    Called MD on call per caller request. Dr Darrick Huntsman agreed she sould go to ED but if she will not, suggested she could try Melatonin, Camimile tea, Sleepytime tea or Valerian root, but do not use Benadryl due to MS.   MD said it is the office polcy not to call in controlled substances after hours.  Per EMR, Lubertha Sayres NP documented emotional state and suggested counseling with Judithe Modest.  Advised to see P. Orvan Falconer or another provider in the office this week for further assistance. Caller hung up.  RN attempted to call back X 2 to give instructions from Dr Darrick Huntsman.  MD instructions not left on voice mail; instead left message to call back for assistance.  Symptoms  Reason For Call & Symptoms: Emergent Call:  Requesting medication to calm her down  during recurrent panic attacks. Panic attacks have been occuring for months with one or two episodes daily lasting hours.  Having rough time right now due to custody issue, isolation, frustration with finding lawyer, emotional and physical pain.  Reports clostrophobia, tachcardia and fear.  Sometimes they occur at night waking her.  Stated severe depression.  Reviewed Health History In EMR: Yes  Reviewed Medications In EMR: Yes  Reviewed Allergies In EMR: Yes  Reviewed Surgeries / Procedures: Yes  Date of Onset of Symptoms: Unknown  Treatments Tried: Melatonin  Treatments Tried Worked: No OB / GYN:  LMP: Unknown  Guideline(s) Used:  Pyschosocial Problems  Depression  Disposition Per Guideline:   Call Local Agency Now  Reason For Disposition Reached:   Recurrent thoughts of death (e.g., "life is not worth living") but not threatening suicide  Advice Given:  N/A  RN Overrode Recommendation:  Go To ED  Nursing judgement based on time of day and MD notes from last office visit.

## 2012-07-26 NOTE — Telephone Encounter (Signed)
Patient called after hours to Nurse hot Line with increased anxiety,  Thoughts of suicidality but no plan (similar to your last note) citing  Multiple social pressures including loss of custody of children, Requesting medication , which was denied.   Patient was advised to go to ER but refused.  Not clear whether she has received the Letter of dismissal  Yet .  No meds were prescribed,  Was advised to follow up in clinic  This week.

## 2012-07-27 NOTE — Telephone Encounter (Signed)
Please see below.

## 2012-07-27 NOTE — Telephone Encounter (Signed)
Advise patient of her dismissal. Also I encouraged her to schedule an appointment with psych and she must see psych. She is mentally complex and needs a specialist.

## 2012-07-27 NOTE — Telephone Encounter (Signed)
I spoke with patient to inform her of dismissal per PCP request. Patient states she has not recived discharge letter yet and that she does not agree with the letter.  Patient states that Padonda and I only sent the letter to her to be judgemental and that we both were the ones who needed a psych evaluation and not her.  I concluded the phone call by explaining she had 30 days to find another provider and if she would like her records sent to that provider we could assist her with that.

## 2012-08-03 ENCOUNTER — Telehealth: Payer: Self-pay | Admitting: Family

## 2012-08-04 NOTE — Telephone Encounter (Signed)
Pt calling to request appointment for steroid injection and also to request a new rx for HCTZ.  I informed patient due to her being discharged from practice she will only be seen for emergent care only.  Pt state she was seen at [facility] the other day and was informed that BP was elevated but was not prescribed any medication and is therefore requesting a script as she she states she has previously taken HCTZ .  Pt was informed that a message would be sent back to PCP for recommendation.

## 2012-08-04 NOTE — Telephone Encounter (Signed)
Left message on voicemail to return call.

## 2012-08-04 NOTE — Telephone Encounter (Signed)
Patient needs to see a PCP asap. Documented blood pressures here have been great! I dont have any evidence that she needs any medication based upon her reading here.

## 2012-08-08 ENCOUNTER — Other Ambulatory Visit: Payer: Self-pay | Admitting: Family

## 2012-08-09 NOTE — Progress Notes (Signed)
This encounter was created in error - please disregard.

## 2012-08-22 ENCOUNTER — Telehealth: Payer: Self-pay | Admitting: Family

## 2012-10-03 ENCOUNTER — Encounter: Payer: Self-pay | Admitting: Gastroenterology

## 2012-10-16 ENCOUNTER — Encounter (HOSPITAL_COMMUNITY): Payer: Self-pay | Admitting: Emergency Medicine

## 2012-10-16 ENCOUNTER — Emergency Department (HOSPITAL_COMMUNITY)
Admission: EM | Admit: 2012-10-16 | Discharge: 2012-10-16 | Discharge status: Against Medical Advice | Payer: 59 | Attending: Emergency Medicine | Admitting: Emergency Medicine

## 2012-10-16 DIAGNOSIS — M129 Arthropathy, unspecified: Secondary | ICD-10-CM | POA: Insufficient documentation

## 2012-10-16 DIAGNOSIS — F319 Bipolar disorder, unspecified: Secondary | ICD-10-CM | POA: Insufficient documentation

## 2012-10-16 DIAGNOSIS — Z791 Long term (current) use of non-steroidal anti-inflammatories (NSAID): Secondary | ICD-10-CM | POA: Insufficient documentation

## 2012-10-16 DIAGNOSIS — Z79899 Other long term (current) drug therapy: Secondary | ICD-10-CM | POA: Insufficient documentation

## 2012-10-16 DIAGNOSIS — R110 Nausea: Secondary | ICD-10-CM

## 2012-10-16 DIAGNOSIS — F909 Attention-deficit hyperactivity disorder, unspecified type: Secondary | ICD-10-CM | POA: Insufficient documentation

## 2012-10-16 DIAGNOSIS — F411 Generalized anxiety disorder: Secondary | ICD-10-CM | POA: Insufficient documentation

## 2012-10-16 DIAGNOSIS — F17200 Nicotine dependence, unspecified, uncomplicated: Secondary | ICD-10-CM | POA: Insufficient documentation

## 2012-10-16 LAB — CBC WITH DIFFERENTIAL/PLATELET
Basophils Absolute: 0 10*3/uL (ref 0.0–0.1)
Basophils Relative: 0 % (ref 0–1)
Eosinophils Relative: 0 % (ref 0–5)
HCT: 33.1 % — ABNORMAL LOW (ref 36.0–46.0)
MCHC: 36.9 g/dL — ABNORMAL HIGH (ref 30.0–36.0)
MCV: 86.9 fL (ref 78.0–100.0)
Monocytes Absolute: 0.7 10*3/uL (ref 0.1–1.0)
Platelets: 209 10*3/uL (ref 150–400)
RDW: 11.9 % (ref 11.5–15.5)

## 2012-10-16 LAB — COMPREHENSIVE METABOLIC PANEL
AST: 22 U/L (ref 0–37)
Albumin: 3.7 g/dL (ref 3.5–5.2)
Alkaline Phosphatase: 70 U/L (ref 39–117)
BUN: 5 mg/dL — ABNORMAL LOW (ref 6–23)
Creatinine, Ser: 0.38 mg/dL — ABNORMAL LOW (ref 0.50–1.10)
GFR calc non Af Amer: >90 mL/min (ref >90)
Sodium: 117 mEq/L — CL (ref 135–145)
Total Bilirubin: 0.7 mg/dL (ref 0.3–1.2)

## 2012-10-16 LAB — LIPASE, BLOOD: Lipase: 14 U/L (ref 11–59)

## 2012-10-16 LAB — ETHANOL: Alcohol, Ethyl (B): <11 mg/dL (ref 0–11)

## 2012-10-16 MED ORDER — ONDANSETRON HCL 4 MG PO TABS: 4.0000 mg | ORAL_TABLET | Freq: Four times a day (QID) | ORAL | Status: DC

## 2012-10-16 MED ORDER — ONDANSETRON HCL 4 MG/2ML IJ SOLN
4.0000 mg | Freq: Once | INTRAMUSCULAR | Status: AC
  Administered 2012-10-16: 4 mg via INTRAVENOUS
  Filled 2012-10-16: qty 2

## 2012-10-16 MED ORDER — SODIUM CHLORIDE 0.9 % IV BOLUS (SEPSIS)
1000.0000 mL | Freq: Once | INTRAVENOUS | Status: AC
  Administered 2012-10-16: 1000 mL via INTRAVENOUS

## 2012-10-16 MED ORDER — METHYLPREDNISOLONE SODIUM SUCC 125 MG IJ SOLR
250.0000 mg | Freq: Once | INTRAMUSCULAR | Status: AC
  Administered 2012-10-16: 250 mg via INTRAVENOUS
  Filled 2012-10-16: qty 4

## 2012-10-16 NOTE — ED Notes (Addendum)
Per EMS: pt c/o of body aches/nausea all day. Hx of MS. Pt called 911 this am, acting crazy and refused transport. Made a 911 hang up call at 1730, sheriff came and pt was c/o aches and nausea.

## 2012-10-16 NOTE — ED Notes (Signed)
Pt is hesitant on treatment.  She stated that she wanted to get off of the EMS truck.   She consistently wants medication.   Informed her of the need to see the RN and MD before we give medication.

## 2012-10-16 NOTE — ED Notes (Signed)
Pt refuses to let vital signs be taken upon discharged.

## 2012-10-16 NOTE — ED Notes (Signed)
Bed: WA17 Expected date: 10/16/12 Expected time: 7:10 PM Means of arrival: Ambulance Comments: Body aches

## 2012-10-16 NOTE — ED Provider Notes (Signed)
CSN: [id number]     Arrival date & time 10/16/12  1925 History   First MD Initiated Contact with Patient 10/16/12 1934     Chief Complaint  Patient presents with  . Generalized Body Aches    HPI Patient presents with complaints of nausea but no vomiting.  History of MS.  Patient is requesting IV steroids and something for nausea.  She states this is why she feels when she has some MS flare.  Patient denies any fever. Past Medical History  Diagnosis Date  . Multiple sclerosis   . Arthritis   . Anxiety   . Bipolar disorder   . ADHD (attention deficit hyperactivity disorder)    Past Surgical History  Procedure Laterality Date  . Cesarean section     No family history on file. History  Substance Use Topics  . Smoking status: Current Every Day Smoker -- 1.00 packs/day for 20 years    Types: Cigarettes  . Smokeless tobacco: Never Used  . Alcohol Use: 2.0 oz/week    4 drink(s) per week     Comment: occasional   OB History   Grav Para Term Preterm Abortions TAB SAB Ect Mult Living                 Review of Systems  Unable to perform ROS: Acuity of condition    Allergies  Review of patient's allergies indicates no known allergies.  Home Medications   Current Outpatient Rx  Name  Route  Sig  Dispense  Refill  . albuterol (PROVENTIL HFA;VENTOLIN HFA) 108 (90 BASE) MCG/ACT inhaler   Inhalation   Inhale 2 puffs into the lungs every 6 (six) hours as needed for wheezing.         . Armodafinil (NUVIGIL) 150 MG tablet   Oral   Take 150 mg by mouth daily.         Marland Kitchen buPROPion (WELLBUTRIN XL) 150 MG 24 hr tablet   Oral   Take 150 mg by mouth daily.         . DULoxetine (CYMBALTA) 30 MG capsule   Oral   Take 1 capsule (30 mg total) by mouth daily.   90 capsule   1   . esomeprazole (NEXIUM) 40 MG capsule   Oral   Take 1 capsule (40 mg total) by mouth daily before breakfast.   30 capsule   11   . FLUoxetine (PROZAC) 20 MG capsule   Oral   Take 1 capsule (20 mg  total) by mouth daily.   90 capsule   1   . Fluticasone-Salmeterol (ADVAIR) 250-50 MCG/DOSE AEPB   Inhalation   Inhale 1 puff into the lungs as needed (shortness of breath).          . AMBULATORY NON FORMULARY MEDICATION      Medication Name: GI cocktail (Maalox or Mylanta and viscous xylocaine) 15 cc every 4 hrs as needed for abdominal pain   150 mL   0   . gabapentin (NEURONTIN) 300 MG capsule   Oral   Take 300 mg by mouth 3 (three) times daily as needed (pain).          Marland Kitchen glatiramer (COPAXONE) 20 MG/ML injection   Subcutaneous   Inject 10-20 mg into the skin daily.         Marland Kitchen lisdexamfetamine (VYVANSE) 50 MG capsule   Oral   Take 1 capsule (50 mg total) by mouth every morning.   30 capsule   0   .  meloxicam (MOBIC) 15 MG tablet   Oral   Take 1 tablet (15 mg total) by mouth daily.   90 tablet   1   . ondansetron (ZOFRAN) 4 MG tablet   Oral   Take 1 tablet (4 mg total) by mouth every 6 (six) hours.   12 tablet   0   . potassium chloride (K-DUR) 10 MEQ tablet   Oral   Take 20 mEq by mouth daily as needed (cramps).          . promethazine (PHENERGAN) 25 MG suppository   Rectal   Place 1 suppository (25 mg total) rectally every 6 (six) hours as needed for nausea.   6 each   0   . risperiDONE (RISPERDAL) 1 MG tablet   Oral   Take 1 tablet (1 mg total) by mouth daily.   30 tablet   3   . topiramate (TOPAMAX) 25 MG tablet      TAKE 3 TABLETS EVERY DAY   90 tablet   0   . tretinoin (RETIN-A) 0.01 % gel      APPLY TO FACE NIGHTLY   45 g   0    BP 158/118  Pulse 73  Temp(Src) 98.2 F (36.8 C) (Oral)  Resp 24  SpO2 99% Physical Exam  Nursing note and vitals reviewed. Constitutional: She is oriented to person, place, and time. She appears well-developed and well-nourished. No distress.  HENT:  Head: Normocephalic and atraumatic.  Eyes: Pupils are equal, round, and reactive to light.  Neck: Normal range of motion.  Cardiovascular: Normal  rate and intact distal pulses.   Pulmonary/Chest: No respiratory distress.  Abdominal: Normal appearance. She exhibits no distension.  Musculoskeletal: Normal range of motion.  Neurological: She is alert and oriented to person, place, and time. No cranial nerve deficit.  Skin: Skin is warm and dry. No rash noted.  Psychiatric: Her speech is normal and behavior is normal. Judgment and thought content normal. Her mood appears anxious. Cognition and memory are normal.    ED Course  Procedures (including critical care time) Labs Review Labs Reviewed  COMPREHENSIVE METABOLIC PANEL - Abnormal; Notable for the following:    Sodium 117 (*)    Potassium 3.0 (*)    Chloride 79 (*)    Glucose, Bld 108 (*)    BUN 5 (*)    Creatinine, Ser 0.38 (*)    All other components within normal limits  CBC WITH DIFFERENTIAL - Abnormal; Notable for the following:    RBC 3.81 (*)    HCT 33.1 (*)    MCHC 36.9 (*)    All other components within normal limits  LIPASE, BLOOD  ETHANOL   Medications  sodium chloride 0.9 % bolus 1,000 mL (1,000 mLs Intravenous New Bag/Given 10/16/12 2039)  ondansetron (ZOFRAN) injection 4 mg (4 mg Intravenous Given 10/16/12 2040)  methylPREDNISolone sodium succinate (SOLU-MEDROL) 125 mg/2 mL injection 250 mg (250 mg Intravenous Given 10/16/12 2120)    Imaging Review No results found.  MDM   1. Nausea    Patient was adamant about leaving prior to completion of evaluation and treatment.  Patient has capacity to understand her actions.  Patient was invited to room return should she change her mind.  We attempted to help the patient find a ride home she refused.  Patient requested steroids for her MS which were given and patient signed out AMA.    Nelia Shi, MD 10/17/12 [phone]

## 2012-10-17 NOTE — ED Notes (Signed)
Pt refused to have blood drawn, was then discharged, said she didn't know any to call, didn't know anyone, was given a bus pass, offered to call a cab that she could pay for when she got home, wanted to check back in the ED to sleep, was told no repeatedly and would not leave the property, security and [employer] had her arrested for trespassing.

## 2012-10-20 ENCOUNTER — Telehealth (HOSPITAL_COMMUNITY): Payer: Self-pay | Admitting: Emergency Medicine

## 2012-10-20 NOTE — ED Notes (Signed)
Provider calling to f/u.  Pt seen last night at providers office and provider saw in EPIC low NA level but pt left and wasn't told about low NA.  Provider wanted to send pt via EMS to ED and pt refused stating she would drive herself to [facility].  Provider calling to verify pt came in.  Provider informed pt didn't return to a [facility] facility last evening/night.

## 2012-10-21 ENCOUNTER — Emergency Department (HOSPITAL_COMMUNITY)
Admission: EM | Admit: 2012-10-21 | Discharge: 2012-10-21 | Disposition: A | Discharge status: Home / Self Care | Payer: Medicare Other

## 2012-10-21 NOTE — ED Notes (Signed)
Pt decided not to check in reports, " i have a fear of hospitals." Attempted to talk to pt and explain the process along with the risks and benefits. Pt continues to refuse to be seen.

## 2012-10-25 ENCOUNTER — Emergency Department (HOSPITAL_COMMUNITY)
Admission: EM | Admit: 2012-10-25 | Discharge: 2012-10-25 | Disposition: A | Discharge status: Home / Self Care | Payer: Medicare Other | Attending: Emergency Medicine | Admitting: Emergency Medicine

## 2012-10-25 ENCOUNTER — Encounter (HOSPITAL_COMMUNITY): Payer: Self-pay | Admitting: Emergency Medicine

## 2012-10-25 DIAGNOSIS — F909 Attention-deficit hyperactivity disorder, unspecified type: Secondary | ICD-10-CM | POA: Insufficient documentation

## 2012-10-25 DIAGNOSIS — IMO0002 Reserved for concepts with insufficient information to code with codable children: Secondary | ICD-10-CM | POA: Insufficient documentation

## 2012-10-25 DIAGNOSIS — F411 Generalized anxiety disorder: Secondary | ICD-10-CM | POA: Insufficient documentation

## 2012-10-25 DIAGNOSIS — F17200 Nicotine dependence, unspecified, uncomplicated: Secondary | ICD-10-CM | POA: Insufficient documentation

## 2012-10-25 DIAGNOSIS — R110 Nausea: Secondary | ICD-10-CM

## 2012-10-25 DIAGNOSIS — Z79899 Other long term (current) drug therapy: Secondary | ICD-10-CM | POA: Insufficient documentation

## 2012-10-25 DIAGNOSIS — Z791 Long term (current) use of non-steroidal anti-inflammatories (NSAID): Secondary | ICD-10-CM | POA: Insufficient documentation

## 2012-10-25 DIAGNOSIS — M129 Arthropathy, unspecified: Secondary | ICD-10-CM | POA: Insufficient documentation

## 2012-10-25 DIAGNOSIS — Z8669 Personal history of other diseases of the nervous system and sense organs: Secondary | ICD-10-CM | POA: Insufficient documentation

## 2012-10-25 DIAGNOSIS — F319 Bipolar disorder, unspecified: Secondary | ICD-10-CM | POA: Insufficient documentation

## 2012-10-25 DIAGNOSIS — R109 Unspecified abdominal pain: Secondary | ICD-10-CM | POA: Insufficient documentation

## 2012-10-25 LAB — CBC WITH DIFFERENTIAL/PLATELET
Basophils Absolute: 0 10*3/uL (ref 0.0–0.1)
Basophils Relative: 1 % (ref 0–1)
Eosinophils Absolute: 0 10*3/uL (ref 0.0–0.7)
MCH: 32.6 pg (ref 26.0–34.0)
MCHC: 35.9 g/dL (ref 30.0–36.0)
Neutrophils Relative %: 72 % (ref 43–77)
Platelets: 235 10*3/uL (ref 150–400)
RDW: 12.8 % (ref 11.5–15.5)

## 2012-10-25 LAB — POCT I-STAT, CHEM 8
Glucose, Bld: 110 mg/dL — ABNORMAL HIGH (ref 70–99)
HCT: 37 % (ref 36.0–46.0)
Hemoglobin: 12.6 g/dL (ref 12.0–15.0)
Potassium: 3.5 mEq/L (ref 3.5–5.1)
Sodium: 135 mEq/L (ref 135–145)

## 2012-10-25 MED ORDER — ONDANSETRON HCL 4 MG/2ML IJ SOLN
4.0000 mg | Freq: Once | INTRAMUSCULAR | Status: AC
  Administered 2012-10-25: 4 mg via INTRAVENOUS
  Filled 2012-10-25: qty 2

## 2012-10-25 MED ORDER — SODIUM CHLORIDE 0.9 % IV BOLUS (SEPSIS)
1000.0000 mL | Freq: Once | INTRAVENOUS | Status: AC
  Administered 2012-10-25: 1000 mL via INTRAVENOUS

## 2012-10-25 MED ORDER — SUCRALFATE 1 G PO TABS
1.0000 g | ORAL_TABLET | Freq: Once | ORAL | Status: AC
  Administered 2012-10-25: 1 g via ORAL
  Filled 2012-10-25: qty 1

## 2012-10-25 MED ORDER — PROMETHAZINE HCL 25 MG RE SUPP: 25.0000 mg | Freq: Four times a day (QID) | RECTAL | Status: DC | PRN

## 2012-10-25 NOTE — ED Provider Notes (Signed)
CSN: [id number]     Arrival date & time 10/25/12  0039 History   First MD Initiated Contact with Patient 10/25/12 0111     Chief Complaint  Patient presents with  . Nausea   (Consider location/radiation/quality/duration/timing/severity/associated sxs/prior Treatment) HPI Comments: Patient presents by EMS.  Tonight, with the complaint of nausea, and lower, abdominal pain, which is very vague in her symptomology.  States she was seen by her Dr. on Thursday or Friday and told that she had low sodium, and low potassium, was told to come to the emergency department for further evaluation, but didn't come until Tuesday night.  Her speech is slowed.  Her history is convoluted and very nonspecific.  She states she has Zofran at home, but she hasn't taken any in several days, but then corrects herself and says she takes it for 5 times a day.  Then turned around and asked for crackers, ginger ale, and peanut butter, when questioned about her food intake.  At home.  She is very nonspecific and vague about when she last stating, that she's going through "some things."  But will not elaborate.  The history is provided by the patient.    Past Medical History  Diagnosis Date  . Multiple sclerosis   . Arthritis   . Anxiety   . Bipolar disorder   . ADHD (attention deficit hyperactivity disorder)    Past Surgical History  Procedure Laterality Date  . Cesarean section     No family history on file. History  Substance Use Topics  . Smoking status: Current Every Day Smoker -- 1.00 packs/day for 20 years    Types: Cigarettes  . Smokeless tobacco: Never Used  . Alcohol Use: 2.0 oz/week    4 drink(s) per week     Comment: occasional   OB History   Grav Para Term Preterm Abortions TAB SAB Ect Mult Living                 Review of Systems  Constitutional: Negative for fever, chills and unexpected weight change.  Respiratory: Negative for cough and shortness of breath.   Cardiovascular: Negative for  chest pain and leg swelling.  Gastrointestinal: Positive for nausea. Negative for vomiting, abdominal pain, diarrhea and constipation.  Genitourinary: Negative for dysuria, vaginal discharge and vaginal pain.  Neurological: Negative for dizziness and weakness.  All other systems reviewed and are negative.    Allergies  Review of patient's allergies indicates no known allergies.  Home Medications   Current Outpatient Rx  Name  Route  Sig  Dispense  Refill  . albuterol (PROVENTIL HFA;VENTOLIN HFA) 108 (90 BASE) MCG/ACT inhaler   Inhalation   Inhale 2 puffs into the lungs every 6 (six) hours as needed for wheezing.         . AMBULATORY NON FORMULARY MEDICATION      Medication Name: GI cocktail (Maalox or Mylanta and viscous xylocaine) 15 cc every 4 hrs as needed for abdominal pain   150 mL   0   . Armodafinil (NUVIGIL) 150 MG tablet   Oral   Take 150 mg by mouth daily.         Marland Kitchen buPROPion (WELLBUTRIN XL) 150 MG 24 hr tablet   Oral   Take 150 mg by mouth daily.         . DULoxetine (CYMBALTA) 30 MG capsule   Oral   Take 1 capsule (30 mg total) by mouth daily.   90 capsule   1   .  esomeprazole (NEXIUM) 40 MG capsule   Oral   Take 1 capsule (40 mg total) by mouth daily before breakfast.   30 capsule   11   . FLUoxetine (PROZAC) 20 MG capsule   Oral   Take 1 capsule (20 mg total) by mouth daily.   90 capsule   1   . Fluticasone-Salmeterol (ADVAIR) 250-50 MCG/DOSE AEPB   Inhalation   Inhale 1 puff into the lungs as needed (shortness of breath).          . gabapentin (NEURONTIN) 300 MG capsule   Oral   Take 300 mg by mouth 3 (three) times daily as needed (pain).          Marland Kitchen glatiramer (COPAXONE) 20 MG/ML injection   Subcutaneous   Inject 10-20 mg into the skin daily.         Marland Kitchen lisdexamfetamine (VYVANSE) 50 MG capsule   Oral   Take 1 capsule (50 mg total) by mouth every morning.   30 capsule   0   . meloxicam (MOBIC) 15 MG tablet   Oral   Take  1 tablet (15 mg total) by mouth daily.   90 tablet   1   . ondansetron (ZOFRAN) 4 MG tablet   Oral   Take 1 tablet (4 mg total) by mouth every 6 (six) hours.   12 tablet   0   . potassium chloride (K-DUR) 10 MEQ tablet   Oral   Take 20 mEq by mouth daily as needed (cramps).          . promethazine (PHENERGAN) 25 MG suppository   Rectal   Place 1 suppository (25 mg total) rectally every 6 (six) hours as needed for nausea.   6 each   0   . risperiDONE (RISPERDAL) 1 MG tablet   Oral   Take 1 tablet (1 mg total) by mouth daily.   30 tablet   3   . topiramate (TOPAMAX) 25 MG tablet      TAKE 3 TABLETS EVERY DAY   90 tablet   0   . tretinoin (RETIN-A) 0.01 % gel      APPLY TO FACE NIGHTLY   45 g   0    BP 114/65  Pulse 74  Temp(Src) 98 F (36.7 C) (Oral)  Resp 18  Ht 5\' 2"  (1.575 m)  Wt 135 lb (61.236 kg)  BMI 24.69 kg/m2  SpO2 95% Physical Exam  Constitutional: She is oriented to person, place, and time. She appears well-developed and well-nourished.  Patient appears ill kept  HENT:  Head: Normocephalic.  Mouth/Throat: Oropharynx is clear and moist.  Eyes: Pupils are equal, round, and reactive to light.  Neck: Normal range of motion.  Cardiovascular: Normal rate and regular rhythm.   Pulmonary/Chest: Effort normal and breath sounds normal.  Abdominal: Soft. Bowel sounds are normal. She exhibits no distension. There is no tenderness.  Musculoskeletal: Normal range of motion.  Lymphadenopathy:    She has no cervical adenopathy.  Neurological: She is alert and oriented to person, place, and time.  Skin: Skin is warm and dry. No rash noted. No erythema.  Psychiatric: She is slowed.    ED Course  Procedures (including critical care time) Labs Review Labs Reviewed  CBC WITH DIFFERENTIAL - Abnormal; Notable for the following:    RBC 3.84 (*)    HCT 34.8 (*)    All other components within normal limits  POCT I-STAT, CHEM 8 - Abnormal; Notable for the  following:    BUN <3 (*)    Glucose, Bld 110 (*)    All other components within normal limits   Imaging Review No results found.  MDM   1. Daily nausea    Patient's labs reviewed all within normal limits.  She's been eating, and drinking without any complaints of nausea, or vomiting.  She states she feels, better     Arman Filter, NP 10/25/12 0419  Arman Filter, NP 10/25/12 [phone]

## 2012-10-25 NOTE — ED Notes (Signed)
Pt tolerated oral fluids and solids, states "My stomach felt better".

## 2012-10-25 NOTE — ED Notes (Signed)
Bed: WA09 Expected date: 10/25/12 Expected time: 12:20 AM Means of arrival: Ambulance Comments: nausea

## 2012-10-25 NOTE — ED Notes (Signed)
Pt was offered ginger ale, saltine crackers and peanut butter.

## 2012-10-25 NOTE — ED Provider Notes (Signed)
Medical screening examination/treatment/procedure(s) were performed by non-physician practitioner and as supervising physician I was immediately available for consultation/collaboration.   William Orpha Dain, MD 10/25/12 0727 

## 2012-10-25 NOTE — ED Notes (Signed)
Per EMS: chronic nausea, 4mg  zofran, VSS,

## 2012-11-07 ENCOUNTER — Other Ambulatory Visit: Payer: Self-pay | Admitting: Family Medicine

## 2012-12-26 ENCOUNTER — Telehealth: Payer: Self-pay

## 2012-12-26 NOTE — Telephone Encounter (Signed)
Patient came in approximately ten am - talked with each of the check in people (nicole, jasmine, ashley, April) before being persuaded to sign in.   Her compliant was pain in her hand.  She wanted an injection.  It took all of [place] to get her to sign in.  Once she was called up for sign in - she was still undecided as to whether or not to stay.  Her insurance was not eligible and she could not produce coverage, insisting it was in the computer.  She had several employees place phone calls for her.  Her mother came and tried to help, patient was not very kind to her mom.  After, French Ana talked with patient she left with her mother at 11:20 am.

## 2012-12-27 ENCOUNTER — Ambulatory Visit: Payer: Medicare Other | Admitting: Neurology

## 2012-12-30 ENCOUNTER — Encounter (HOSPITAL_COMMUNITY): Payer: Self-pay | Admitting: Emergency Medicine

## 2012-12-30 ENCOUNTER — Emergency Department (HOSPITAL_COMMUNITY)
Admission: EM | Admit: 2012-12-30 | Discharge: 2012-12-30 | Disposition: A | Discharge status: Home / Self Care | Payer: Medicare Other | Attending: Emergency Medicine | Admitting: Emergency Medicine

## 2012-12-30 DIAGNOSIS — F411 Generalized anxiety disorder: Secondary | ICD-10-CM | POA: Diagnosis not present

## 2012-12-30 DIAGNOSIS — F909 Attention-deficit hyperactivity disorder, unspecified type: Secondary | ICD-10-CM | POA: Diagnosis not present

## 2012-12-30 DIAGNOSIS — F319 Bipolar disorder, unspecified: Secondary | ICD-10-CM | POA: Diagnosis not present

## 2012-12-30 DIAGNOSIS — R259 Unspecified abnormal involuntary movements: Secondary | ICD-10-CM | POA: Diagnosis not present

## 2012-12-30 DIAGNOSIS — M79609 Pain in unspecified limb: Secondary | ICD-10-CM | POA: Diagnosis present

## 2012-12-30 DIAGNOSIS — IMO0002 Reserved for concepts with insufficient information to code with codable children: Secondary | ICD-10-CM | POA: Insufficient documentation

## 2012-12-30 DIAGNOSIS — Z79899 Other long term (current) drug therapy: Secondary | ICD-10-CM | POA: Insufficient documentation

## 2012-12-30 DIAGNOSIS — G35 Multiple sclerosis: Secondary | ICD-10-CM | POA: Insufficient documentation

## 2012-12-30 DIAGNOSIS — F17200 Nicotine dependence, unspecified, uncomplicated: Secondary | ICD-10-CM | POA: Insufficient documentation

## 2012-12-30 DIAGNOSIS — R209 Unspecified disturbances of skin sensation: Secondary | ICD-10-CM | POA: Insufficient documentation

## 2012-12-30 DIAGNOSIS — M129 Arthropathy, unspecified: Secondary | ICD-10-CM | POA: Insufficient documentation

## 2012-12-30 DIAGNOSIS — Z791 Long term (current) use of non-steroidal anti-inflammatories (NSAID): Secondary | ICD-10-CM | POA: Insufficient documentation

## 2012-12-30 MED ORDER — METHYLPREDNISOLONE SODIUM SUCC 125 MG IJ SOLR
250.0000 mg | Freq: Once | INTRAMUSCULAR | Status: AC
Start: 2012-12-30 — End: 2012-12-30
  Administered 2012-12-30: 250 mg via INTRAMUSCULAR
  Filled 2012-12-30: qty 4

## 2012-12-30 MED ORDER — HYDROCODONE-ACETAMINOPHEN 5-325 MG PO TABS: 2.0000 | ORAL_TABLET | ORAL | Status: DC | PRN

## 2012-12-30 MED ORDER — METHYLPREDNISOLONE SODIUM SUCC 1000 MG IJ SOLR
1000.0000 mg | Freq: Once | INTRAMUSCULAR | Status: DC
  Filled 2012-12-30: qty 8

## 2012-12-30 MED ORDER — METHYLPREDNISOLONE 4 MG PO KIT: PACK | ORAL | Status: DC

## 2012-12-30 NOTE — ED Notes (Signed)
Patient is alert and oriented x3.  She is complaining of left hand pain, chest discomfort, stabbing pain in the back and burning in her feet. She states that all the pains are related to her MS.  She is unable to tell me which pain is worse.

## 2012-12-30 NOTE — ED Provider Notes (Signed)
CSN: [id number]     Arrival date & time 12/30/12  1843 History   First MD Initiated Contact with Patient 12/30/12 1935     Chief Complaint  Patient presents with  . Hand Pain    numbness and pain    HPI  Patient presents with multiple complaints. Her main concern is that she feel's having a flare of her multiple sclerosis. She gets some burning in her head and some burning in the back of her chest. Left index finger and right hand feels burning and painful too.  No frank chest pain. No dyspnea. No fevers. These are symptoms she's had in the past. She has a nuclear care physician at [facility] family practice. She sees neurology at [facility] about this. She's not currently on any immune modulators for her MS.  She was offered more aggressive treatment, but has thus far declined because of her concern over side afffects.  Past Medical History  Diagnosis Date  . Multiple sclerosis   . Arthritis   . Anxiety   . Bipolar disorder   . ADHD (attention deficit hyperactivity disorder)    Past Surgical History  Procedure Laterality Date  . Cesarean section     History reviewed. No pertinent family history. History  Substance Use Topics  . Smoking status: Current Every Day Smoker -- 1.00 packs/day for 20 years    Types: Cigarettes  . Smokeless tobacco: Never Used  . Alcohol Use: 2.0 oz/week    4 drink(s) per week     Comment: occasional   OB History   Grav Para Term Preterm Abortions TAB SAB Ect Mult Living                 Review of Systems  Constitutional: Negative for fever, chills, diaphoresis, appetite change and fatigue.  HENT: Negative for mouth sores, sore throat and trouble swallowing.   Eyes: Negative for visual disturbance.  Respiratory: Negative for cough, chest tightness, shortness of breath and wheezing.   Cardiovascular: Negative for chest pain.  Gastrointestinal: Negative for nausea, vomiting, abdominal pain, diarrhea and abdominal distention.  Endocrine: Negative for  polydipsia, polyphagia and polyuria.  Genitourinary: Negative for dysuria, frequency and hematuria.  Musculoskeletal: Negative for gait problem.  Skin: Negative for color change, pallor and rash.  Neurological: Positive for tremors and numbness. Negative for dizziness, seizures, syncope, facial asymmetry, speech difficulty, weakness, light-headedness and headaches.  Hematological: Does not bruise/bleed easily.  Psychiatric/Behavioral: Negative for behavioral problems and confusion.    Allergies  Review of patient's allergies indicates no known allergies.  Home Medications   Current Outpatient Rx  Name  Route  Sig  Dispense  Refill  . albuterol (PROVENTIL HFA;VENTOLIN HFA) 108 (90 BASE) MCG/ACT inhaler   Inhalation   Inhale 2 puffs into the lungs every 6 (six) hours as needed for wheezing.         . Armodafinil (NUVIGIL) 150 MG tablet   Oral   Take 150 mg by mouth daily.         Marland Kitchen buPROPion (WELLBUTRIN XL) 150 MG 24 hr tablet   Oral   Take 150 mg by mouth daily.         . DULoxetine (CYMBALTA) 30 MG capsule   Oral   Take 1 capsule (30 mg total) by mouth daily.   90 capsule   1   . esomeprazole (NEXIUM) 40 MG capsule   Oral   Take 1 capsule (40 mg total) by mouth daily before breakfast.   30  capsule   11   . FLUoxetine (PROZAC) 20 MG capsule   Oral   Take 1 capsule (20 mg total) by mouth daily.   90 capsule   1   . Fluticasone-Salmeterol (ADVAIR) 250-50 MCG/DOSE AEPB   Inhalation   Inhale 1 puff into the lungs as needed (shortness of breath).          Marland Kitchen ibuprofen (ADVIL,MOTRIN) 200 MG tablet   Oral   Take 600-800 mg by mouth every 6 (six) hours as needed.         Marland Kitchen lisdexamfetamine (VYVANSE) 50 MG capsule   Oral   Take 1 capsule (50 mg total) by mouth every morning.   30 capsule   0   . meloxicam (MOBIC) 15 MG tablet   Oral   Take 1 tablet (15 mg total) by mouth daily.   90 tablet   1   . promethazine (PHENERGAN) 25 MG suppository   Rectal    Place 1 suppository (25 mg total) rectally every 6 (six) hours as needed for nausea.   6 each   0   . risperiDONE (RISPERDAL) 1 MG tablet   Oral   Take 1 tablet (1 mg total) by mouth daily.   30 tablet   3   . topiramate (TOPAMAX) 25 MG capsule   Oral   Take 75 mg by mouth daily.         Marland Kitchen HYDROcodone-acetaminophen (NORCO/VICODIN) 5-325 MG per tablet   Oral   Take 2 tablets by mouth every 4 (four) hours as needed.   10 tablet   0   . methylPREDNISolone (MEDROL DOSEPAK) 4 MG tablet      6 po on day 1, decrease by 1 tab per day   21 tablet   0    BP 133/83  Pulse 101  Temp(Src) 98.7 F (37.1 C) (Oral)  Resp 16  SpO2 96% Physical Exam  Constitutional: She is oriented to person, place, and time. She appears well-developed and well-nourished. No distress.  Anxious. Oriented lucid  HENT:  Head: Normocephalic.  Eyes: Conjunctivae are normal. Pupils are equal, round, and reactive to light. No scleral icterus.  Neck: Normal range of motion. Neck supple. No thyromegaly present.  Cardiovascular: Normal rate and regular rhythm.  Exam reveals no gallop and no friction rub.   No murmur heard. Pulmonary/Chest: Effort normal and breath sounds normal. No respiratory distress. She has no wheezes. She has no rales.  Abdominal: Soft. Bowel sounds are normal. She exhibits no distension. There is no tenderness. There is no rebound.  Musculoskeletal: Normal range of motion.  Neurological: She is alert and oriented to person, place, and time.  Normal symmetric Strength to shoulder shrug, triceps, biceps, grip,wrist flex/extend,and intrinsics  Norma lsymmetric sensation above and below clavicles, and to all distributions to UEs. Norma symmetric strength to flex/.extend hip and knees, dorsi/plantar flex ankles. Normal symmetric sensation to all distributions to LEs Patellar and achilles reflexes 1-2+. Downgoing Babinski Her exam is intact. Her complaint shows only subjective symptoms. No  objective findings noted  Skin: Skin is warm and dry. No rash noted.  Psychiatric: She has a normal mood and affect. Her behavior is normal.    ED Course  Procedures (including critical care time) Labs Review Labs Reviewed - No data to display Imaging Review No results found.  EKG Interpretation   None       MDM   1. Multiple sclerosis    The time dose of Solu-Medrol  and Medrol Dosepak is reasonable. She has close follow. Locally and at [facility]. I don't think imaging or testing is indicated at this time    Roney [name], MD 12/30/12 2030

## 2012-12-30 NOTE — ED Notes (Signed)
Pt sts to writer that she does not want an EKG, just wants her pain under control.  Writer notified ERP Fayrene Fearing

## 2012-12-30 NOTE — ED Notes (Signed)
Pt complaining of chest pain.  Pt also complaining of left hand and arm pain that starts in the left hand and radiates to the left shoulder.  Pt states her hand dexterity is less than normal given the patients MS condition.

## 2013-01-11 ENCOUNTER — Encounter (HOSPITAL_COMMUNITY): Payer: Self-pay | Admitting: Emergency Medicine

## 2013-01-11 ENCOUNTER — Emergency Department (HOSPITAL_COMMUNITY)
Admission: EM | Admit: 2013-01-11 | Discharge: 2013-01-11 | Disposition: A | Discharge status: Home / Self Care | Payer: Medicare Other | Attending: Internal Medicine | Admitting: Internal Medicine

## 2013-01-11 ENCOUNTER — Telehealth: Payer: Self-pay | Admitting: Neurology

## 2013-01-11 DIAGNOSIS — R5381 Other malaise: Secondary | ICD-10-CM | POA: Diagnosis not present

## 2013-01-11 DIAGNOSIS — R262 Difficulty in walking, not elsewhere classified: Secondary | ICD-10-CM | POA: Diagnosis not present

## 2013-01-11 DIAGNOSIS — M129 Arthropathy, unspecified: Secondary | ICD-10-CM | POA: Insufficient documentation

## 2013-01-11 DIAGNOSIS — M79609 Pain in unspecified limb: Secondary | ICD-10-CM | POA: Insufficient documentation

## 2013-01-11 DIAGNOSIS — F17200 Nicotine dependence, unspecified, uncomplicated: Secondary | ICD-10-CM | POA: Insufficient documentation

## 2013-01-11 DIAGNOSIS — F411 Generalized anxiety disorder: Secondary | ICD-10-CM | POA: Diagnosis not present

## 2013-01-11 DIAGNOSIS — F319 Bipolar disorder, unspecified: Secondary | ICD-10-CM | POA: Diagnosis not present

## 2013-01-11 DIAGNOSIS — Z79899 Other long term (current) drug therapy: Secondary | ICD-10-CM | POA: Insufficient documentation

## 2013-01-11 DIAGNOSIS — R1013 Epigastric pain: Secondary | ICD-10-CM | POA: Insufficient documentation

## 2013-01-11 DIAGNOSIS — F909 Attention-deficit hyperactivity disorder, unspecified type: Secondary | ICD-10-CM | POA: Insufficient documentation

## 2013-01-11 DIAGNOSIS — F419 Anxiety disorder, unspecified: Secondary | ICD-10-CM

## 2013-01-11 DIAGNOSIS — G35 Multiple sclerosis: Secondary | ICD-10-CM | POA: Diagnosis present

## 2013-01-11 DIAGNOSIS — M546 Pain in thoracic spine: Secondary | ICD-10-CM | POA: Diagnosis not present

## 2013-01-11 MED ORDER — ACETAMINOPHEN 325 MG PO TABS
650.0000 mg | ORAL_TABLET | Freq: Once | ORAL | Status: AC
  Administered 2013-01-11: 650 mg via ORAL
  Filled 2013-01-11: qty 2

## 2013-01-11 NOTE — ED Provider Notes (Signed)
CSN: [id number]     Arrival date & time 01/11/13  1201 History   None    Chief Complaint  Patient presents with  . Multiple Sclerosis   (Consider location/radiation/quality/duration/timing/severity/associated sxs/prior Treatment) HPI [name] is a 52 y.o. female w/ PMHx of Multiple Sclerosis, Arthritis, Anxiety, Bipolar Disorder, and ADHD, presents to the ED w/ numerous non-specific complaints, claiming she is having an MS flare. The patient was brought to the ED by EMS after the patient had called her insurance company 6 times over the course of the evening, requesting assistance/hospitalization for her current mental status. Today, she claims to have severe left hand pain, burning epigastric pain, back pain, difficulty w/ ambulation, worsening depression, and anxiety. Patient does not seem to be complaint w/ her medications and claims to have ran out of most of them.  She was seen at her PCP's office yesterday for complaints of sinusitis, and was told that she needs to follow up with a neurologist for management of her MS. She has been referred to a neurologist several time in the past and has failed to show up for the appointments. Her other medications were refilled at that time. Additionally, the patient was seen in the [facility] ED on 12/30/12 for complaints of an MS flare where she was given dose of Solumedrol and discharged w/ Medrol dosepak. No imaging was done at that time.    Past Medical History  Diagnosis Date  . Multiple sclerosis   . Arthritis   . Anxiety   . Bipolar disorder   . ADHD (attention deficit hyperactivity disorder)    Past Surgical History  Procedure Laterality Date  . Cesarean section     No family history on file. History  Substance Use Topics  . Smoking status: Current Every Day Smoker -- 1.00 packs/day for 20 years    Types: Cigarettes  . Smokeless tobacco: Never Used  . Alcohol Use: 2.0 oz/week    4 drink(s) per week     Comment: occasional   OB  History   Grav Para Term Preterm Abortions TAB SAB Ect Mult Living                 Review of Systems General: Positive for fatigue. Denies fever, chills, diaphoresis, appetite change. Respiratory: Positive for cough (chronic) Denies SOB, DOE, chest tightness, and wheezing.   Cardiovascular: Denies chest pain, palpitations and leg swelling.  Gastrointestinal: Positive for epigastric pain. Denies nausea, vomiting, diarrhea, constipation, blood in stool and abdominal distention.  Genitourinary: Denies dysuria, urgency, frequency, hematuria, flank pain and difficulty urinating.  Endocrine: Denies hot or cold intolerance, sweats, polyuria, polydipsia. Musculoskeletal: Positive for gait difficulty, back pain, arthralgias.  Skin: Denies pallor, rash and wounds.  Neurological: Positive for dizziness, lightheadedness, non-specific numbness and headaches. Denies  seizures, syncope. Psychiatric/Behavioral: Positive for mood changes, anxiety, sleep disturbance, and agitation.   Allergies  Review of patient's allergies indicates no known allergies.  Home Medications   Current Outpatient Rx  Name  Route  Sig  Dispense  Refill  . albuterol (PROVENTIL HFA;VENTOLIN HFA) 108 (90 BASE) MCG/ACT inhaler   Inhalation   Inhale 2 puffs into the lungs every 6 (six) hours as needed for wheezing.         . Armodafinil (NUVIGIL) 150 MG tablet   Oral   Take 150 mg by mouth daily.         Marland Kitchen buPROPion (WELLBUTRIN XL) 150 MG 24 hr tablet   Oral   Take  150 mg by mouth daily.         . DULoxetine (CYMBALTA) 30 MG capsule   Oral   Take 1 capsule (30 mg total) by mouth daily.   90 capsule   1   . esomeprazole (NEXIUM) 40 MG capsule   Oral   Take 1 capsule (40 mg total) by mouth daily before breakfast.   30 capsule   11   . FLUoxetine (PROZAC) 20 MG capsule   Oral   Take 1 capsule (20 mg total) by mouth daily.   90 capsule   1   . Fluticasone-Salmeterol (ADVAIR) 250-50 MCG/DOSE AEPB    Inhalation   Inhale 1 puff into the lungs as needed (shortness of breath).          Marland Kitchen HYDROcodone-acetaminophen (NORCO/VICODIN) 5-325 MG per tablet   Oral   Take 2 tablets by mouth every 4 (four) hours as needed.   10 tablet   0   . ibuprofen (ADVIL,MOTRIN) 200 MG tablet   Oral   Take 600-800 mg by mouth every 6 (six) hours as needed.         Marland Kitchen lisdexamfetamine (VYVANSE) 50 MG capsule   Oral   Take 1 capsule (50 mg total) by mouth every morning.   30 capsule   0   . meloxicam (MOBIC) 15 MG tablet   Oral   Take 1 tablet (15 mg total) by mouth daily.   90 tablet   1   . risperiDONE (RISPERDAL) 1 MG tablet   Oral   Take 1 tablet (1 mg total) by mouth daily.   30 tablet   3   . topiramate (TOPAMAX) 25 MG capsule   Oral   Take 75 mg by mouth daily.         . methylPREDNISolone (MEDROL DOSEPAK) 4 MG tablet      6 po on day 1, decrease by 1 tab per day   21 tablet   0   . promethazine (PHENERGAN) 25 MG suppository   Rectal   Place 1 suppository (25 mg total) rectally every 6 (six) hours as needed for nausea.   6 each   0     Physical Exam Filed Vitals:   01/11/13 1208  BP: 127/86  Pulse: 80  Temp: 98.2 F (36.8 C)  TempSrc: Oral  Resp: 16  Height: 5\' 2"  (1.575 m)  Weight: 140 lb (63.504 kg)  SpO2: 100%   General: Vital signs reviewed.  Patient is a very anxious and agitated female, w/ disheveled appearance. Alert and oriented x3.  Head: Normocephalic and atraumatic. Eyes: PERRL, EOMI, conjunctivae normal, No scleral icterus.  Neck: Supple, trachea midline, normal ROM, No JVD, masses, thyromegaly, or carotid bruit present.  Cardiovascular: RRR, S1 normal, S2 normal, no murmurs, gallops, or rubs. Pulmonary/Chest: Normal respiratory effort, CTAB, no wheezes, rales, or rhonchi. Abdominal: Soft, non-tender, non-distended, bowel sounds are normal, no masses, organomegaly, or guarding present.  Musculoskeletal: No joint deformities, erythema, or stiffness,  ROM full and no nontender. Extremities: No swelling or edema,  pulses symmetric and intact bilaterally. No cyanosis or clubbing. Neurological: A&O x3, Strength is normal and symmetric bilaterally, cranial nerve II-XII are grossly intact, no focal motor deficit, sensory intact to light touch bilaterally.  Skin: Warm, dry and intact. No rashes or erythema. Psychiatric: Patient very disheveled, anxious, agitated, tangential thinking. Memory and speech intact.  ED Course  Procedures (including critical care time) Labs Review Labs Reviewed - No data to display Imaging  Review No results found.  EKG Interpretation   None       MDM   [name] is a 52 y.o. female w/ PMHx of Multiple Sclerosis, Arthritis, Anxiety, Bipolar Disorder, and ADHD, presents to the ED w/ numerous non-specific complaints, claiming she is having an MS flare.  -Given the patient's presentation, there is nothing emergently to be done at this time. Patient ambulating well, does not seem to be in acute pain or distress, Solumedrol not indicated at this time, despite her desire for this. Not given course of steroids by her PCP who she saw yesterday. After discussion w/ [facility], patient has been referred many times to neurologists and MS specialists and is non-compliant with her medical care as well as her medications.  -Given Tylenol 650 mg po for pain complaints. -In discussion w/ her primary care providers, it was agreed that her bigger problem at this time is her mental health issues. After talking with the patient, she very clearly does have depression, bipolar disorder, and severe anxiety, however, she reports no thoughts of suicidal ideations, homicidal ideations, and does not appear to be having a manic episode at this time. She also is not actively psychotic, w/ no hallucinations currently. No indication for emergent psychiatric admission at this time.   -She very much needs close behavioral  health follow up as well as neurology follow up for her MS management and PCP follow up in the next 1-2 weeks. Given extensive information for behavioral health.   Courtney Paris, MD 01/11/13 [phone]

## 2013-01-11 NOTE — ED Notes (Signed)
Since arrival pt has been agitated, asking repitive questions, voicing multiple needs (food, drinks, abx,) nursing has rounded regularly and has addressed as many of these needs as possible and has also attempted on multiple occasions to explain ED process and limitations, pt moved to Pod C to speak with social work at this time re difficulty obtaining meds, report given, pt has appeared comfortable and in no distress throughtout ED course

## 2013-01-11 NOTE — Telephone Encounter (Signed)
I called the patient, left a message. The patient has multiple sclerosis, bipolar disorder. The patient was seen through the emergency room today, it was felt that she did not have an urgent problem, but it was felt that her psychiatric disease was the primary issue. The patient was complaining of pain, spasms. We will try to get the patient seemed to our office in the next several days.

## 2013-01-11 NOTE — ED Notes (Signed)
Delay in discharge. ACT team with patient.

## 2013-01-11 NOTE — ED Notes (Signed)
To ED from home, EMS sts that pt called and emailed ins company and then did not respond so Ins company called EMS, A/O X4, VSS, pt agitated and non-compliant with undressing, reluctant to be seen, concerned about toe nails, c/o upper back pain, left hand and left foot pain, NAD

## 2013-01-11 NOTE — ED Notes (Signed)
Long conversation with pt re: her various needs:  Court date for expired tags/registration, child custody issues, insurance coverage, ? Need for MH care.  Also spoke with pt's daughter who concerned about pt's MH issues and believes she needs "long-term treatment" at a [facility] facility.  Dtr advised to contact Magistrate if she believed that pt was a danger to herself/others in order to IVC pt.  Dtr already familiar with this process.  Pt denied SI/HI, but stated that she may f/u at [facility] because of the various stressors in her life.  CSW supported this plan and inquired with MH Tech, Bruce if this would be appropriate.  Later in our encounter, pt denied need for MH intervention and became angry with her daughter for wanting to IVC her.  CSW called pt's pharmacy to check on the status of her prescriptions/coverages.  Per the pharmacy, they have no insurance on file for pt and pt has not produced any proof of insurance.  This info was relayed to pt, who stated that she had [employer].  (Earlier in conversation, pt stated that she had [employer] of [place] insurance.)  CSW assisted pt in calling her insurance company ([employer]) to try and resolve her coverage issues, but pt was impatient and would not stay on the phone long enough to talk to the appropriate person.  MC has UHC Medicare listed as [employer], but pt states that she has "cancelled that[employer].  Pt reluctant to d/c from ED and was unsatisfied about the medical w/u she received here.  Pt advised to speak with her insurance company and f/u with her regular medical providers.  Pt also encouraged to make it to her court date in the am, so that she may save herself from additional legal charges for not showing up.  Pt angry at CSW for not talking to her longer. CSW explained that several other pt's were waiting for assistance and that CSW had to see those pt's as well.

## 2013-01-11 NOTE — ED Notes (Signed)
Pt sts having difficulty with Ins and difficulty filling scripts, reports recent  emotional stress

## 2013-01-11 NOTE — Telephone Encounter (Signed)
Patient was discharged from ED, I don't see any appointments in Epic with our practice and cannot get into [facility].  She was advised to see her PCP, Behavioral Health and to follow up with neurology for her MS.

## 2013-01-11 NOTE — ED Notes (Signed)
Social work at bedside.  

## 2013-01-11 NOTE — ED Notes (Signed)
Social work at bedside with patient. 

## 2013-01-11 NOTE — ED Notes (Signed)
Social worker with the patient.

## 2013-01-12 ENCOUNTER — Telehealth: Payer: Self-pay | Admitting: *Deleted

## 2013-01-12 NOTE — ED Provider Notes (Signed)
I saw and evaluated the patient, reviewed the resident's note and I agree with the findings and plan.  EKG Interpretation   None         Candyce Churn, MD 01/12/13 1438

## 2013-01-12 NOTE — ED Provider Notes (Signed)
I saw and evaluated the patient, reviewed the resident's note and I agree with the findings and plan.  EKG Interpretation   None       52 year old female presenting with numerous nonspecific complaints. It was difficult to determine exactly why she came to the emergency department. Her review of systems was pain positive on exam, disheveled, no distress, normal respirations, normal perfusion, normal mental status.  She requested IV Solu-Medrol for treatment of an MS exacerbation. However, she did not have objective signs of such exacerbation. Advised that she followup with her primary doctor and her neurologist.  Clinical Impression: 1. Anxiety       Candyce Churn, MD 01/12/13 [phone]

## 2013-01-12 NOTE — Telephone Encounter (Signed)
Left msg for patient to call office to schedule appt. Dr. Anne Hahn has 11:00 this am if not will be a 12:00 work in visit.

## 2013-01-16 ENCOUNTER — Telehealth: Payer: Self-pay | Admitting: Neurology

## 2013-01-16 ENCOUNTER — Encounter: Payer: Self-pay | Admitting: Neurology

## 2013-01-16 ENCOUNTER — Ambulatory Visit (INDEPENDENT_AMBULATORY_CARE_PROVIDER_SITE_OTHER): Payer: BC Managed Care – PPO | Admitting: Neurology

## 2013-01-16 VITALS — BP 110/60 | HR 82 | Wt 144.0 lb

## 2013-01-16 DIAGNOSIS — G35 Multiple sclerosis: Secondary | ICD-10-CM

## 2013-01-16 MED ORDER — PREDNISONE 5 MG PO TABS: 5.0000 mg | ORAL_TABLET | Freq: Every day | ORAL | Status: DC

## 2013-01-16 NOTE — Patient Instructions (Signed)
Multiple Sclerosis Multiple sclerosis (MS) is a disease of the central nervous system. Its cause is unknown. It is more common in the northern states than in the southern states. There is a higher incidence of MS in women. There is a wide variation in the symptoms (problems) of MS. This is because of the many different ways it affects the central nervous system. It often comes on in episodes or attacks. These attacks may last weeks to months. There may be long periods of nearly no problems between attacks. The main symptoms include visual problems (associated with eye pain), numbness, weakness, and paralysis in extremities (arms/hands and legs/feet). There may also be tremors and problems with balance and walking. The age when MS starts is variable. Advances in medicine continue to improve the treatment of this illness. There is no known cure for MS but there are medications that help. MS is not an inherited illness, although your risk of getting this disease is higher if you have a relative with MS. The best radiologic (x-ray) study for MS is an MRI (magnetic resonance imaging). There are medications available to decrease the number and frequency of attacks. SYMPTOMS  The symptoms of MS are caused by loss of insulation (myelin) of the nerves of the brain. When this happens, brain signals do not get transmitted properly or may not get transmitted at all. Some of the problems caused by this include:   Numbness.  Weakness.  Paralysis in extremities.  Visual problems, eye pain.  Balance problems.  Tremors. DIAGNOSIS  Your caregiver can do studies on you to make this diagnosis. This may include specialized X-rays and spinal fluid studies. HOME CARE INSTRUCTIONS   Take medications as directed by your caregiver. Baclofen is a drug commonly used to reduce muscle spasticity. Steroids are often used for short term relief.  Exercise as directed.  Use physical and occupational therapy as directed by  your caregiver. Careful attention to this medical care can help avoid depression.  See your caregiver if you begin to have problems with depression. This is a common problem in MS. Patients often continue to work many years after the diagnosis of MS. Document Released: 01/23/2000 Document Revised: 04/19/2011 Document Reviewed: 08/31/2006 [employer] Patient Information 2014 [employer].  

## 2013-01-16 NOTE — Telephone Encounter (Signed)
Patient was in the office after her appointment and would not leave.  I was asked to speak to patient to find out what her further needs were.  The patient said she was not happy with the visit and wanted a different doctor.  I hold her she could go to another practice or see someone else in our practice the next visit. She wanted to know if we had any MS specialists in the office, I explained that many of our doctors treat MS patients.  She was explicit about a MS specialist.  She then went on to say that she needed IV solumedrol because she was not feeling well and if not that she needed more pain medicine.  I told her the doctor examined her and he is the one to decide on the treatment based on his findings.  She was not willing to accept any explanations I tried to give, so I suggested she go to the ER if she needed further treatment.  She said why should I if I'm here.  This type of conversation continued for a few minutes and did not seem to go anywhere.  I finally got up and said that she needed to check out and I couldn't offer her any other advise.  I did see her proceed to checkout but apparently the patient did not leave and was found going to the sleep lab where she asked someone else to help her.

## 2013-01-16 NOTE — Telephone Encounter (Signed)
Pt called upset but seemed very confused. Begin asking about the forms process. I explained that then she went on about where or not Dr. Anne Hahn had told me anything about admitting her and that she specifically asked to be admitted so she does not understand why this is not happening. She states she needs to speak with someone because she has a lot of questions.

## 2013-01-16 NOTE — Telephone Encounter (Signed)
Pt calling back to ask that [facility] because she can't pick up anything from [facility] states she doesn't know why. She then wen in again about how she needs to be admitted and this is to much going on. Advised her someone would call her back
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
## 2013-01-16 NOTE — Telephone Encounter (Signed)
I called patient. The Solu-Medrol was not ordered, I do not believe that this is indicated. The patient is having pain in the neck, shoulder, and down the arm. I am more concerned that there may be a cervical spine issue rather than an MS issue causing her current symptoms. MRI the brain and cervical spine has been set up, I will call the patient will get the results. I have called in a prednisone Dosepak for her.

## 2013-01-17 ENCOUNTER — Telehealth: Payer: Self-pay | Admitting: Neurology

## 2013-01-17 MED ORDER — PREDNISONE 5 MG PO TABS: ORAL_TABLET | ORAL | Status: DC

## 2013-01-17 NOTE — Telephone Encounter (Signed)
Patient calling requesting refill of Cymbalta. Please call.

## 2013-01-17 NOTE — Telephone Encounter (Signed)
I called the patient several times, no answer. The patient apparently wants [place] [facility] [place]. I will call the prednisone to that pharmacy.

## 2013-01-17 NOTE — Telephone Encounter (Signed)
Last Rx was written by Baker Pierini, FNP Dr Anne Hahn, Would you like to prescribe Cymbalta?  Please advise.  Thank you.

## 2013-01-17 NOTE — Telephone Encounter (Signed)
The patient is to get the medication through the original prescribing physician.

## 2013-01-18 NOTE — Telephone Encounter (Signed)
I called the patient back, got no answer.  Left message.  

## 2013-01-24 ENCOUNTER — Telehealth: Payer: Self-pay | Admitting: Neurology

## 2013-01-24 NOTE — Telephone Encounter (Signed)
I called the patient. I left a message. The patient wanted a prescription for Ativan for anxiety. I think that it is best that she get this medication to her psychiatry position who is managing her bipolar disorder. I will not call In a medication for anxiety.

## 2013-01-25 ENCOUNTER — Telehealth: Payer: Self-pay | Admitting: Neurology

## 2013-01-25 NOTE — Telephone Encounter (Signed)
I called the patient, left a message. The patient has called 4 or 5 times a day wanting to be admitted to the hospital for some reason. I will call back tomorrow to try to find out what is going on. This patient clearly has a very unusual affect, acted strangely when she was in the office previously.

## 2013-01-26 ENCOUNTER — Telehealth: Payer: Self-pay | Admitting: Neurology

## 2013-01-26 NOTE — Telephone Encounter (Signed)
Pt calling back to say that she is having anxiety and want to know if Dr. Clarisa Kindred wife a domestic attorney, wants to know if Dr, ever goes to court or writes notes about going to court. Doesn't know if it will be necessary but wanted to ask. Also wants to know who the psychiatrist might be.

## 2013-01-26 NOTE — Telephone Encounter (Signed)
I called patient. I left a message. MRI evaluations have been ordered, should be set up soon. The patient should have a revisit with the nurse practitioner in this office in the next several weeks. We need to get the patient on any disease modifying agent.

## 2013-01-26 NOTE — Telephone Encounter (Signed)
I called patient. The patient indicates that she is involved with a custody battle for her to daughters. The patient indicates that she is under a lot of stress. The patient indicates that she does not have a psychiatrist, and her primary care physician gives her all of her psychiatric medications which include 3 different antidepressant medications and an antipsychotic medication. The patient is also on stimulant drugs. I have recommended that she see a psychiatrist, and I have offered to set up a referral to a psychiatrist, but the patient does not wish to consider this at this time. The patient will be set up for a revisit with a nurse practitioner through our office to go over various treatment options for her multiple sclerosis. The patient has called multiple times wanting to be admitted to the hospital for IV Solu-Medrol, I am not clear that this is indicated at this point. The patient does in fact have multiple sclerosis, and she does need to be treated. Her psychiatric issues however, appear to be a significant impairment to medical management of this patient.

## 2013-01-26 NOTE — Telephone Encounter (Signed)
I have called the patient twice a day, talk with her this morning, call again I left a message this afternoon. The MRI should be done soon, I see no reason to move up the study. I have passed for the patient be seen in office by the nurse practitioner to get the patient on a disease modifying agent. I am not sure there is any way from keeping the patient from calling Multiple multiple multiple times during the day. I think this is a manifestation of her psychiatric disease.

## 2013-01-26 NOTE — Telephone Encounter (Addendum)
Patient is requesting to speak with Dr Anne Hahn, has more questions about the MS- getting it under control(wants to be as strong as she can when she goes to court in Jan, did not wish to schedule appt with NP at the moment).  She wanted a sooner appt.for MRI, if she cannot get this, wanted to know if she could have one dose of the solu medrol. She said that she is going thru a lot.

## 2013-01-29 ENCOUNTER — Telehealth: Payer: Self-pay | Admitting: Neurology

## 2013-01-29 NOTE — Telephone Encounter (Signed)
Patient calling yelling and cursing states she does not understand what is going on right now as far as her care. I reviewed the notes and advised her that she needed to schedule the MRI that was offered because declining it is only going to push it further out. She yelled into the phone that she is not waiting that long and that something needs to be done. I asked her if she wanted to speak back with the Dr. She yelled NO and to just f*%$# do it! And hung up the phone.

## 2013-01-29 NOTE — Telephone Encounter (Signed)
I did not called patient. Conversation noted. I suspect that the patient may need to be discharged from our practice in the near future if the behavior like this continues.

## 2013-01-30 ENCOUNTER — Telehealth: Payer: Self-pay | Admitting: *Deleted

## 2013-01-30 NOTE — Telephone Encounter (Signed)
Please advise 

## 2013-01-30 NOTE — Telephone Encounter (Signed)
I called patient. The patient apparently has not heard about the MRI scheduling with a visual evoked response test scheduling. I'll try to find out about this. She still wants a three-day course of Solu-Medrol, not sure that this is indicated at this point. The patient wants medications to "give her energy over the holidays".

## 2013-01-30 NOTE — Telephone Encounter (Signed)
Please refer to prior telephone note. I indicated the patient that I do not want to prescribe her Vyvanse. We need to get her visual evoked response test, MRI evaluation, and a revisit to initiate a disease modifying agent. I do not think that she needs Solu-Medrol.

## 2013-01-30 NOTE — Telephone Encounter (Signed)
Spoke to patient for an extended length of time trying to get clarification on exactly what she is requesting.  As we know she has multiple issues.  Her requests are as follows:  She wants to know if the doctor will order Vyvanse even though it was originally prescribed by another provider, she is all out.  Then she would like IV solumedrol, she says her left hand is numb and tingly, up to her shoulder and left side of neck, with a stabbing pain, her right hand is also somewhat effected but not nearly as bad as the left.  Next she would like to be admitted to the hospital so she can have an emergent MRI of cervical neck, she thinks this may be a disk problem.  Then she wanted to know if the doctor would be willing to write a letter for a custody suit for her children saying that her MS is stable.   We also talked about behavioral health, but not sure she is ready to go that route.  The patient initially was accusatory about poor patient care, but by the end of the conversation she was expressing what a difficult time she was having without her children and not feeling well.

## 2013-02-01 ENCOUNTER — Telehealth: Payer: Self-pay | Admitting: Family Medicine

## 2013-02-01 ENCOUNTER — Encounter (HOSPITAL_COMMUNITY): Payer: Self-pay | Admitting: Emergency Medicine

## 2013-02-01 ENCOUNTER — Emergency Department (HOSPITAL_COMMUNITY)
Admission: EM | Admit: 2013-02-01 | Discharge: 2013-02-01 | Disposition: A | Discharge status: Home / Self Care | Payer: Medicare Other | Attending: Emergency Medicine | Admitting: Emergency Medicine

## 2013-02-01 DIAGNOSIS — F411 Generalized anxiety disorder: Secondary | ICD-10-CM | POA: Insufficient documentation

## 2013-02-01 DIAGNOSIS — IMO0002 Reserved for concepts with insufficient information to code with codable children: Secondary | ICD-10-CM | POA: Insufficient documentation

## 2013-02-01 DIAGNOSIS — Y9389 Activity, other specified: Secondary | ICD-10-CM | POA: Insufficient documentation

## 2013-02-01 DIAGNOSIS — Z8669 Personal history of other diseases of the nervous system and sense organs: Secondary | ICD-10-CM | POA: Insufficient documentation

## 2013-02-01 DIAGNOSIS — F909 Attention-deficit hyperactivity disorder, unspecified type: Secondary | ICD-10-CM | POA: Insufficient documentation

## 2013-02-01 DIAGNOSIS — Z791 Long term (current) use of non-steroidal anti-inflammatories (NSAID): Secondary | ICD-10-CM | POA: Insufficient documentation

## 2013-02-01 DIAGNOSIS — Z79899 Other long term (current) drug therapy: Secondary | ICD-10-CM | POA: Insufficient documentation

## 2013-02-01 DIAGNOSIS — Z792 Long term (current) use of antibiotics: Secondary | ICD-10-CM | POA: Insufficient documentation

## 2013-02-01 DIAGNOSIS — F319 Bipolar disorder, unspecified: Secondary | ICD-10-CM | POA: Insufficient documentation

## 2013-02-01 DIAGNOSIS — S0180XA Unspecified open wound of other part of head, initial encounter: Secondary | ICD-10-CM | POA: Insufficient documentation

## 2013-02-01 DIAGNOSIS — Y929 Unspecified place or not applicable: Secondary | ICD-10-CM | POA: Insufficient documentation

## 2013-02-01 DIAGNOSIS — M542 Cervicalgia: Secondary | ICD-10-CM | POA: Insufficient documentation

## 2013-02-01 DIAGNOSIS — F17200 Nicotine dependence, unspecified, uncomplicated: Secondary | ICD-10-CM | POA: Insufficient documentation

## 2013-02-01 DIAGNOSIS — Z23 Encounter for immunization: Secondary | ICD-10-CM | POA: Insufficient documentation

## 2013-02-01 DIAGNOSIS — S0990XA Unspecified injury of head, initial encounter: Secondary | ICD-10-CM | POA: Insufficient documentation

## 2013-02-01 DIAGNOSIS — M129 Arthropathy, unspecified: Secondary | ICD-10-CM | POA: Insufficient documentation

## 2013-02-01 MED ORDER — TETANUS-DIPHTH-ACELL PERTUSSIS 5-2.5-18.5 LF-MCG/0.5 IM SUSP
0.5000 mL | Freq: Once | INTRAMUSCULAR | Status: AC
  Administered 2013-02-01: 0.5 mL via INTRAMUSCULAR
  Filled 2013-02-01: qty 0.5

## 2013-02-01 NOTE — Telephone Encounter (Signed)
Pt called at approx 10 am Christmas morning.  She had a cut over one eye "about the size of fingernail."  It occurred last night around 6pm.  I let her know that we would be glad to see when when we open at noon, or she could go to the ER now if she would prefer. I advised her that she should have her cut looked at somewhere. She wanted me to open the office early and meet her there now- let her know that I do not have the ability to open the office early but we will be open in 2 hours, at which time she hung up the phone
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
## 2013-02-01 NOTE — ED Provider Notes (Signed)
Patient suffered laceration at left. Orbital area immediately inferior to left eyebrow on "a garbage bag. She also complains of burning pain in her neck for several months. She also reports feeling depressed over several weeks. Patient is followed by neurology Dr. Anne Hahn for neck pain she reports that he has requested further diagnostic studies to be done as an outpatient. However denies suicidal ideation. She is alert Glasgow Coma Score 15. Moves all extremities. Gait normal. HEENT exam there is a 0.5 cm linear laceration parallel to the left eyebrow and immediately inferior to left eyebrow. Edges together. Wound is not require repair. Patient is not presently suffer neurologic an emergency. Has no focal weakness. She denies suicidal or homicidal ideation.  Doug Sou, MD 02/01/13 1249

## 2013-02-05 NOTE — ED Provider Notes (Signed)
Medical screening examination/treatment/procedure(s) were conducted as a shared visit with non-physician practitioner(s) and myself.  I personally evaluated the patient during the encounter.  EKG Interpretation   None        Doug Sou, MD 02/05/13 1455

## 2013-02-08 ENCOUNTER — Other Ambulatory Visit: Payer: Self-pay | Admitting: Neurology

## 2013-02-08 ENCOUNTER — Telehealth: Payer: Self-pay | Admitting: Neurology

## 2013-02-08 LAB — HM DEXA SCAN

## 2013-02-08 MED ORDER — RIZATRIPTAN BENZOATE 10 MG PO TBDP: 10.0000 mg | ORAL_TABLET | ORAL | Status: DC | PRN

## 2013-02-08 NOTE — Telephone Encounter (Signed)
Called patient back after receiving message that patient was experiencing a severe headache. Upon calling, patient very agitated/agressive. Expressed having a severe headache, refused to fully describe symptoms, became agitated. Notes she has taken Maxalt in the past with good benefit. Informed her I would send prescription to her pharmacy. Patient notes having a "MS symptoms" and requests oral steroids. Refused to describe symptoms, became angry and accused me/[facility] of poor patient care. Counseled her that oral steroids are not appropriate at this time. She again became agitated/angry, informing me she knows more than I do about MS and challenged me to ask her a question about MS. I informed her I am happy to call in Maxalt prescription at which time she hung up.   Her current [facility] was not open as it is New Years day. Called into [facility] in [place]. Attempted to call patient back to inform her of prescription location change, no one answered so message was left.

## 2013-02-18 ENCOUNTER — Ambulatory Visit (INDEPENDENT_AMBULATORY_CARE_PROVIDER_SITE_OTHER): Payer: Medicare Other | Admitting: Emergency Medicine

## 2013-02-18 VITALS — BP 110/70 | HR 86 | Temp 98.0°F | Resp 16 | Wt 146.0 lb

## 2013-02-18 DIAGNOSIS — G35 Multiple sclerosis: Secondary | ICD-10-CM

## 2013-02-18 DIAGNOSIS — J018 Other acute sinusitis: Secondary | ICD-10-CM

## 2013-02-18 DIAGNOSIS — F319 Bipolar disorder, unspecified: Secondary | ICD-10-CM

## 2013-02-18 MED ORDER — HYDROXYZINE PAMOATE 25 MG PO CAPS: 25.0000 mg | ORAL_CAPSULE | Freq: Three times a day (TID) | ORAL | Status: DC | PRN

## 2013-02-18 MED ORDER — BUPROPION HCL ER (XL) 150 MG PO TB24: 150.0000 mg | ORAL_TABLET | Freq: Every day | ORAL | Status: DC

## 2013-02-18 MED ORDER — LEVOFLOXACIN 500 MG PO TABS: 500.0000 mg | ORAL_TABLET | Freq: Every day | ORAL | Status: AC

## 2013-02-18 MED ORDER — FLUOXETINE HCL 20 MG PO CAPS: 20.0000 mg | ORAL_CAPSULE | Freq: Every day | ORAL | Status: DC

## 2013-02-18 NOTE — Progress Notes (Signed)
[facility] [address], [place] [id number] [phone]  Date:  02/18/2013   Name:  Anne Black   DOB:  03-28-1960   MRN:  [id number]  PCP:  No primary provider on file.    Chief Complaint: Sinusitis   History of Present Illness:  Anne Black is a 53 y.o. very pleasant female patient who presents with the following:  Complains of a previously treated (ineffectively it seems) sinusitis.  No fever or chills.  Has purulent nasal drainage.  No cough or coryza.  No nausea or vomiting.  No stool change, wheezing or shortness of breath.  Was given two rounds of antibiotic last month for sinusitis.   She has a history of bipolar disorder and is out of medications.  Is requesting refill on her ADD meds and nuvigil.   Insisting that she needs an injection of steroids for treatment of her MS.   Requested a prescription for valium or xanax for anxiety related to a funeral tomorrow.   No improvement with over the counter medications or other home remedies. Denies other complaint or health concern today.   Patient Active Problem List   Diagnosis Date Noted  . Bipolar disorder, unspecified 05/02/2012  . Unspecified hereditary and idiopathic peripheral neuropathy 05/01/2012  . Depression 05/01/2012  . GERD (gastroesophageal reflux disease) 05/01/2012  . Osteoarthrosis, unspecified whether generalized or localized, involving lower leg 05/01/2012  . MS (multiple sclerosis) 09/14/2011  . Anemia 09/14/2011    Past Medical History  Diagnosis Date  . Multiple sclerosis   . Arthritis   . Anxiety   . Bipolar disorder   . ADHD (attention deficit hyperactivity disorder)     Past Surgical History  Procedure Laterality Date  . Cesarean section    . Myringotomy with tube placement Bilateral   . Tonsillectomy      History  Substance Use Topics  . Smoking status: Current Every Day Smoker -- 1.00 packs/day for 20 years    Types: Cigarettes  . Smokeless tobacco:  Never Used  . Alcohol Use: 2.0 oz/week    4 drink(s) per week     Comment: occasional    Family History  Problem Relation Age of Onset  . Heart disease Mother     No Known Allergies  Medication list has been reviewed and updated.  Current Outpatient Prescriptions on File Prior to Visit  Medication Sig Dispense Refill  . albuterol (PROVENTIL HFA;VENTOLIN HFA) 108 (90 BASE) MCG/ACT inhaler Inhale 2 puffs into the lungs every 6 (six) hours as needed for wheezing.      . Armodafinil (NUVIGIL) 150 MG tablet Take 150 mg by mouth daily.      Marland Kitchen. buPROPion (WELLBUTRIN XL) 150 MG 24 hr tablet Take 150 mg by mouth daily.      Marland Kitchen. FLUoxetine (PROZAC) 20 MG capsule Take 1 capsule (20 mg total) by mouth daily.  90 capsule  1  . Fluticasone-Salmeterol (ADVAIR) 250-50 MCG/DOSE AEPB Inhale 1 puff into the lungs as needed (shortness of breath).       Marland Kitchen. HYDROcodone-acetaminophen (NORCO/VICODIN) 5-325 MG per tablet Take 2 tablets by mouth every 4 (four) hours as needed.  10 tablet  0  . ibuprofen (ADVIL,MOTRIN) 200 MG tablet Take 600-800 mg by mouth every 6 (six) hours as needed.      Marland Kitchen. lisdexamfetamine (VYVANSE) 50 MG capsule Take 1 capsule (50 mg total) by mouth every morning.  30 capsule  0  . meloxicam (MOBIC)  15 MG tablet Take 1 tablet (15 mg total) by mouth daily.  90 tablet  1  . risperiDONE (RISPERDAL) 1 MG tablet Take 1 tablet (1 mg total) by mouth daily.  30 tablet  3  . rizatriptan (MAXALT-MLT) 10 MG disintegrating tablet Take 1 tablet (10 mg total) by mouth as needed for migraine. May repeat in 2 hours if needed  9 tablet  11  . DULoxetine (CYMBALTA) 30 MG capsule Take 1 capsule (30 mg total) by mouth daily.  90 capsule  1  . promethazine (PHENERGAN) 25 MG suppository Place 1 suppository (25 mg total) rectally every 6 (six) hours as needed for nausea.  6 each  0  . topiramate (TOPAMAX) 25 MG capsule Take 75 mg by mouth daily.       No current facility-administered medications on file prior to  visit.    Review of Systems:  As per HPI, otherwise negative.    Physical Examination: Filed Vitals:   02/18/13 1751  BP: 110/70  Pulse: 86  Temp: 98 F (36.7 C)  Resp: 16   Filed Vitals:   02/18/13 1751  Weight: 146 lb (66.225 kg)   Body mass index is 26.7 kg/(m^2). Ideal Body Weight:    GEN: WDWN, NAD, Non-toxic, A & O x 3 HEENT: Atraumatic, Normocephalic. Neck supple. No masses, No LAD. Ears and Nose: No external deformity. CV: RRR, No M/G/R. No JVD. No thrill. No extra heart sounds. PULM: CTA B, no wheezes, crackles, rhonchi. No retractions. No resp. distress. No accessory muscle use. ABD: S, NT, ND, +BS. No rebound. No HSM. EXTR: No c/c/e NEURO Normal gait.  PSYCH: Normally interactive. Conversant. Not depressed appearing but has rambling disjointed speech and has a paranoid approach.     Assessment and Plan: Sinusitis Bipolar disorder  Signed,  Phillips Odor, MD

## 2013-02-18 NOTE — Patient Instructions (Signed)

## 2013-03-19 ENCOUNTER — Telehealth: Payer: Self-pay

## 2013-03-19 NOTE — Telephone Encounter (Signed)
Patient is requesting refill on her levelox and nasal spray [phone]

## 2013-03-19 NOTE — Telephone Encounter (Signed)
Left message to return call. What medication needs refill?

## 2013-03-19 NOTE — Telephone Encounter (Signed)
Patient wants a medication refill as soon as possible. Patient seems as if something may be wrong. Please call ASAP.Marland KitchenMarland Kitchen

## 2013-03-21 NOTE — Telephone Encounter (Signed)
LMOM to CB. 

## 2013-03-21 NOTE — Telephone Encounter (Signed)
LM for rtn call Pt will need to RTC for medication refill of this abx and nasal spray.

## 2013-03-21 NOTE — Telephone Encounter (Signed)
Duplicate message. 

## 2013-03-26 NOTE — Telephone Encounter (Signed)
Patient requesting Vyvanse, and Provigil. She indicates she has gotten them in the past from Dr Foy GuadalajaraFried. I have advised her to return to his office. She indicates she does not go there any longer. She wants Dr Dareen PianoAnderson to take over. I have advised her Dr Dareen PianoAnderson does not ever prescribe these medications. She may want to see a psychologist for these. To you FYI

## 2013-03-26 NOTE — Telephone Encounter (Signed)
Left message for patient to return to clinic for antibiotic for refills. Advised if she has questions to call.

## 2013-03-27 ENCOUNTER — Telehealth: Payer: Self-pay | Admitting: *Deleted

## 2013-03-27 NOTE — Telephone Encounter (Signed)
I have no interest in prescribing these medications.

## 2013-03-27 NOTE — Telephone Encounter (Signed)
Patient called the front desk requesting a refill on Vyvanse and Provigil, and as well wants a referral to PCP.  By viewing the previous phone note in the chart (dated 02/09), the patient also called Dr Linwood Dibbles office last week requesting these meds since she no longer sees the doctor that used to prescribe the.  It is noted they advised the patient he does not prescribe these meds and that she may want to see a psychologist.  After further review, I see a phone note from our office on 12/23 noting Dr Anne Hahn will not prescribe Vyvanse.  I called the patient back.  Got no answer.  Left message indicating we have previously noted we do not prescribe the med requested (Vyvanse).   Would you like to prescribe Provigil?  Please advise.  Thank you.

## 2013-03-29 ENCOUNTER — Telehealth: Payer: Self-pay | Admitting: Neurology

## 2013-03-29 NOTE — Telephone Encounter (Signed)
Patient calling to request steroid medication. Please call patient.

## 2013-03-29 NOTE — Telephone Encounter (Signed)
Patient is calling requesting steroid medication and would like to know if a prescription could be sent in. I asked the patient why do she feel like that she needs this medication and patient stated that she needs it because it helps with her MS. Please advise.

## 2013-03-29 NOTE — Telephone Encounter (Signed)
I called patient. The patient indicates that she has not felt well today. The patient is having some cramps in the chest level, cognitive issues. The patient has recent had an upper respiratory tract infection, has been on antibiotics. The patient once steroids, similar to her demands from the last visit in December 2014. We will get a work in revisit for this patient to evaluate her issues.

## 2013-03-30 ENCOUNTER — Telehealth: Payer: Self-pay | Admitting: Neurology

## 2013-03-30 NOTE — Telephone Encounter (Signed)
Patient wants to know if she can get samples of Gilenya to make her feel better--has not taken before--if no samples can Rx [facility] [place]..thank you..Marland Kitchen

## 2013-03-30 NOTE — Telephone Encounter (Signed)
Patient returning call to Dr. Anne HahnWillis regarding work in visit appointment. Please call to schedule.

## 2013-03-30 NOTE — Telephone Encounter (Signed)
Called patient and scheduled an appt. Per Dr Clarisa Kindred note (to see a NP), she did schedule but wanted to ask Dr Anne Hahn for something for now to help her feel better.  She is dealing with grief over going to court for custody of children, was afraid of leaving her house on yesterday, took some pain meds and felt better, feels unheard

## 2013-03-30 NOTE — Telephone Encounter (Signed)
Patient called front desk requesting samples of Gilenya (or an Rx for it) to make her feel better, says she has not taken it before.  Patient has an appt scheduled on 02/25 with Larita FifeLynn.  I called the patient back.  Explained Gilenya is not a med she can start taking without proper tests being done.  She said "well fine, then he needs to give me a prescription for Provigil".  I explained (as per previous note) we would not be prescribing that medication.  Patient got upset and said "there is no teamwork here with this doctor patient relationship, it's all the doctor.  Provigil makes people feel better.  I have to go" and then she hung up.

## 2013-04-03 ENCOUNTER — Other Ambulatory Visit: Payer: Self-pay | Admitting: Nurse Practitioner

## 2013-04-04 ENCOUNTER — Telehealth: Payer: Self-pay | Admitting: Neurology

## 2013-04-04 ENCOUNTER — Telehealth: Payer: Self-pay | Admitting: *Deleted

## 2013-04-04 ENCOUNTER — Encounter: Payer: Self-pay | Admitting: Nurse Practitioner

## 2013-04-04 ENCOUNTER — Ambulatory Visit (INDEPENDENT_AMBULATORY_CARE_PROVIDER_SITE_OTHER): Payer: Managed Care, Other (non HMO) | Admitting: Nurse Practitioner

## 2013-04-04 VITALS — BP 140/76 | HR 96 | Wt 150.0 lb

## 2013-04-04 DIAGNOSIS — Z9111 Patient's noncompliance with dietary regimen: Secondary | ICD-10-CM | POA: Insufficient documentation

## 2013-04-04 DIAGNOSIS — Z9119 Patient's noncompliance with other medical treatment and regimen: Secondary | ICD-10-CM

## 2013-04-04 DIAGNOSIS — G35 Multiple sclerosis: Secondary | ICD-10-CM

## 2013-04-04 DIAGNOSIS — Z91199 Patient's noncompliance with other medical treatment and regimen due to unspecified reason: Secondary | ICD-10-CM | POA: Insufficient documentation

## 2013-04-04 NOTE — Telephone Encounter (Signed)
Patient calling to ask Dr. Anne Hahn about how supportive he is regarding getting custody of her children back, and also if he has any attorney suggestions.

## 2013-04-04 NOTE — Progress Notes (Signed)
PATIENT: Anne Black DOB: Mar 05, 1960   REASON FOR VISIT: follow up for MS HISTORY FROM: patient  HISTORY OF PRESENT ILLNESS: [name] is a 53 year old white female with a history of bipolar disorder and multiple sclerosis. The patient apparently has been seen and evaluated by multiple physicians in this area, and treated for her multiple sclerosis. The patient indicates that she has been seen through this office previously, although we have no medical records of this at this time. The patient has been seen by Dr. Lin Givens, Dr. Epimenio Foot, and Dr. Renne Crigler at [facility]. The patient indicates that she has been treated with Novantrone in the past, and she has been on Copaxone and Betaseron. The patient is very vague about her history, and it is not clear when she went off all of her medications for multiple sclerosis. The patient likely has not been on medications for greater than 5 years. The patient recently was in the emergency room with a multitude of unusual complaints, one of which was left-sided neck, shoulder, and arm pain with numbness down the left arm. The patient was given some Solu-Medrol, and was placed on a prednisone Dosepak. The patient indicates that she still having symptoms. The patient indicates that she first had the diagnosis of multiple sclerosis in 1994 during her pregnancy, associated with weakness and numbness of the left side. The patient has not had any recent MRI evaluations in this area. The patient indicates that her last MRI was done at [facility]. The patient has a very unusual affect, and she is a very poor historian.   UPDATE 04/04/13 (LL):  Patient returns to office requesting refills of medications prescribed elsewhere, IV steroids and she is interested in starting on Gilenya.  She describes multiple social issues including losing custody of her children and financial problems.  She did not complete the tests ordered at previous visits she states due to financial difficulties.   She is a poor historian, and gives different answers to questions at different times.  She is requesting refills of Vistaril, Vicodin, Vyvanse and Nuvigil with IV steroids to "get her through this tough time."  She describes being persecuted by the court system and other medical offices "for no reason," stating "I always get blamed for everything."  REVIEW OF SYSTEMS: Out of a complete 14 system review of symptoms, the patient complains only of the following symptoms, and all other reviewed systems are negative.  Choking feeling Pain and numbness, left side of neck   ALLERGIES: No Known Allergies  HOME MEDICATIONS: Outpatient Prescriptions Prior to Visit  Medication Sig Dispense Refill  . albuterol (PROVENTIL HFA;VENTOLIN HFA) 108 (90 BASE) MCG/ACT inhaler Inhale 2 puffs into the lungs every 6 (six) hours as needed for wheezing.      . Armodafinil (NUVIGIL) 150 MG tablet Take 150 mg by mouth daily.      Marland Kitchen buPROPion (WELLBUTRIN XL) 150 MG 24 hr tablet Take 1 tablet (150 mg total) by mouth daily.  30 tablet  1  . DULoxetine (CYMBALTA) 30 MG capsule Take 1 capsule (30 mg total) by mouth daily.  90 capsule  1  . FLUoxetine (PROZAC) 20 MG capsule Take 1 capsule (20 mg total) by mouth daily.  30 capsule  1  . Fluticasone-Salmeterol (ADVAIR) 250-50 MCG/DOSE AEPB Inhale 1 puff into the lungs as needed (shortness of breath).       Marland Kitchen HYDROcodone-acetaminophen (NORCO/VICODIN) 5-325 MG per tablet Take 2 tablets by mouth every 4 (four) hours as needed.  10 tablet  0  . hydrOXYzine (VISTARIL) 25 MG capsule Take 1 capsule (25 mg total) by mouth 3 (three) times daily as needed.  30 capsule  0  . ibuprofen (ADVIL,MOTRIN) 200 MG tablet Take 600-800 mg by mouth every 6 (six) hours as needed.      Marland Kitchen. lisdexamfetamine (VYVANSE) 50 MG capsule Take 1 capsule (50 mg total) by mouth every morning.  30 capsule  0  . meloxicam (MOBIC) 15 MG tablet Take 1 tablet (15 mg total) by mouth daily.  90 tablet  1  . promethazine  (PHENERGAN) 25 MG suppository Place 1 suppository (25 mg total) rectally every 6 (six) hours as needed for nausea.  6 each  0  . risperiDONE (RISPERDAL) 1 MG tablet Take 1 tablet (1 mg total) by mouth daily.  30 tablet  3  . rizatriptan (MAXALT-MLT) 10 MG disintegrating tablet Take 1 tablet (10 mg total) by mouth as needed for migraine. May repeat in 2 hours if needed  9 tablet  11  . topiramate (TOPAMAX) 25 MG capsule Take 75 mg by mouth daily.       No facility-administered medications prior to visit.     PHYSICAL EXAM  Filed Vitals:   04/04/13 1058  BP: 140/76  Pulse: 96  Weight: 150 lb (68.04 kg)   Body mass index is 27.43 kg/(m^2).  Physical Exam  General: The patient is alert at the time of the examination. The patient is minimally overweight.  Head: Pupils are equal, round, and reactive to light.   Neck: The neck is supple, no carotid bruits are noted.  Respiratory: The respiratory examination is clear.  Cardiovascular: The cardiovascular examination reveals a regular rate and rhythm, no obvious murmurs or rubs are noted.  Skin: Extremities are without significant edema.   Neurologic Exam  Mental status: The patient is alert, not completely cooperative. Unusual affect.  Cranial nerves: Facial symmetry is present. There is good sensation of the face to pinprick and soft touch bilaterally. The strength of the facial muscles and the muscles to head turning and shoulder shrug are normal bilaterally. Speech is well enunciated, no aphasia or dysarthria is noted. Extraocular movements are full. Visual fields are full.  Motor: The motor testing reveals 5 over 5 strength of all 4 extremities. Good symmetric motor tone is noted throughout.  Sensory: Sensory testing is intact to pinprick, soft touch, vibration sensation, and position sense on all 4 extremities, with the exception that there is some decrease in pinprick sensation on the right foot, and left hand. No evidence of extinction  is noted.  Coordination: Cerebellar testing reveals good finger-nose-finger and heel-to-shin bilaterally. An intention tremor seen in the left greater than right upper extremity.  Gait and station: Gait is normal. Tandem gait is slightly unsteady, the patient does not fall. Romberg is negative. No drift is seen.  Reflexes: Deep tendon reflexes are symmetric and normal bilaterally.   ASSESSMENT AND PLAN  53 y.o. year old female  has a past medical history of Multiple sclerosis; Arthritis; Anxiety; Bipolar disorder; and ADHD (attention deficit hyperactivity disorder) here with MS.  The patient indicated that she is comes to this practice because it is closer to her home. In fact, the patient was discharged from the practice of Dr. Renne CriglerPharr in July 2014. The cause for the discharge is not clear. The patient last had MRI evaluation of the brain in February of 2013. This did show numerous white matter lesions consistent with the diagnosis of  multiple sclerosis.  This patient has a very unusual affect, and she is a very poor historian. The patient has minimal deficits on clinical examination at this point. The patient was set up for MRI evaluation of the brain and cervical spinal cord, and a visual evoked response test, but failed to complete these.  The patient was discharged from this practice several years ago. The patient indicates that she wants a prescription for hydrocodone, Vistaril, Vyvanse and Nuvigil I do not feel comfortable writing these prescriptions, due to her psychiatric illness, they should be managed by her psychiatrist. I do not see the indication for Steroids either, as her exam is relatively benign, and the steroids may induce mania.    The patient was encouraged to see psychiatry, but states that an appointment with Dr. Crecencio Mc is not available until May.  She states she has tried to seek care through [facility] as well.  This patient clearly has significant psychiatric disease, and while I left  the room, the patient wanted to wander the halls of the practice, repeatedly being asked by staff to remain in the exam room.  She was unwilling to accept the treatment plan being offered, repeatedly stating what other neurologists had prescribed her in the past.  Tawny Asal Ocie Stanzione, MSN, NP-C 04/04/2013, 8:07 PM [facility] [address] [place], [place] [address] [phone]  Note: This document was prepared with digital dictation and possible smart phrase technology. Any transcriptional errors that result from this process are unintentional.

## 2013-04-04 NOTE — Telephone Encounter (Signed)
Pt called and stated that she has several issues going on.  She wants her inhaler filled, she has drainage, she wants a steroid sent in.  I advised her that she needs to be evaluated and also the way she sounded on the phone.  After advising her to come in the pt hung up.  I called her back to ask her what happened and she said that she was frustrated because she has to come in and al she wants is to be treated over the phone.  I advised her again that she should come in.  She seemed very agitated and she also was going on about things she had going on at home that she was also frustrated about.

## 2013-04-04 NOTE — Telephone Encounter (Signed)
I called the patient. The patient wants to go if I will support her custody issues with her children. The patient has 2 twins that are 54 years old, they are living with her ex-husband in the [place] area, she lives in [place]. The patient has 2 issues, one is the physical impairment associated with the multiple sclerosis. From this aspect, I see no contraindication that she would not be able to take care of her children. However, this patient clearly has severe psychiatric issues, and this may preclude her ability to adequately manage children. The patient will be seeing a psychiatrist in May of 2015, I'll defer any opinions regarding this issue to the psychiatrist.   Once again, today when seen in the office, the patient was found wandering in the halls, similar to what had happened on her first visit. On that visit, the patient was found in the EEG, sleep suite. The patient would not allow me to examine her today, and once again made reference to the fact that I have used her at some point in the past, greater than 10 years ago. I am not sure what this issue was about, but she has brought up this issue previously.  The patient has acted very negatively towards other physician staff. I have recommended that she initiate medication for multiple sclerosis to include Tecfidera or any of the "platformed" medications that are relatively safe. I do not feel comfortable initiating Gilenya or Tysabri in a patient that is not reliable.  At this point, I believe this patient is a high legal risk to our practice. The report between the patient and our physician staff, including myself, is poor. The patient is not following our recommendations, and she appears to respond in a somewhat hostile fashion to her physician and nurse practitioner interaction.. I do not wish to follow this patient in the future, she will be discharged from our practice. I'll be happy to refer her to the [facility] neurology practice. The  patient was previously discharged from [facility], and she was also discharged from this practice several years ago, but somehow ended back up in our office.

## 2013-04-04 NOTE — Patient Instructions (Signed)
Please call your Psychiatrist for a sooner appointment.  In Emergency, go to [facility].

## 2013-04-06 ENCOUNTER — Encounter (HOSPITAL_COMMUNITY): Payer: Self-pay | Admitting: Emergency Medicine

## 2013-04-06 ENCOUNTER — Emergency Department (HOSPITAL_COMMUNITY): Payer: Medicare Other

## 2013-04-06 ENCOUNTER — Emergency Department (HOSPITAL_COMMUNITY)
Admission: EM | Admit: 2013-04-06 | Discharge: 2013-04-07 | Disposition: A | Discharge status: Other Acute Inpatient Hospital | Payer: Medicare Other | Attending: Emergency Medicine | Admitting: Emergency Medicine

## 2013-04-06 ENCOUNTER — Telehealth: Payer: Self-pay

## 2013-04-06 ENCOUNTER — Telehealth: Payer: Self-pay | Admitting: *Deleted

## 2013-04-06 DIAGNOSIS — F32A Depression, unspecified: Secondary | ICD-10-CM

## 2013-04-06 DIAGNOSIS — Z79899 Other long term (current) drug therapy: Secondary | ICD-10-CM | POA: Insufficient documentation

## 2013-04-06 DIAGNOSIS — M129 Arthropathy, unspecified: Secondary | ICD-10-CM | POA: Insufficient documentation

## 2013-04-06 DIAGNOSIS — Z8669 Personal history of other diseases of the nervous system and sense organs: Secondary | ICD-10-CM | POA: Insufficient documentation

## 2013-04-06 DIAGNOSIS — F17200 Nicotine dependence, unspecified, uncomplicated: Secondary | ICD-10-CM | POA: Insufficient documentation

## 2013-04-06 DIAGNOSIS — R079 Chest pain, unspecified: Secondary | ICD-10-CM | POA: Insufficient documentation

## 2013-04-06 DIAGNOSIS — R45851 Suicidal ideations: Secondary | ICD-10-CM | POA: Insufficient documentation

## 2013-04-06 DIAGNOSIS — Z791 Long term (current) use of non-steroidal anti-inflammatories (NSAID): Secondary | ICD-10-CM | POA: Insufficient documentation

## 2013-04-06 DIAGNOSIS — F319 Bipolar disorder, unspecified: Secondary | ICD-10-CM | POA: Insufficient documentation

## 2013-04-06 DIAGNOSIS — F329 Major depressive disorder, single episode, unspecified: Secondary | ICD-10-CM

## 2013-04-06 DIAGNOSIS — F3160 Bipolar disorder, current episode mixed, unspecified: Secondary | ICD-10-CM

## 2013-04-06 DIAGNOSIS — F411 Generalized anxiety disorder: Secondary | ICD-10-CM | POA: Insufficient documentation

## 2013-04-06 DIAGNOSIS — IMO0002 Reserved for concepts with insufficient information to code with codable children: Secondary | ICD-10-CM | POA: Insufficient documentation

## 2013-04-06 LAB — I-STAT TROPONIN, ED: TROPONIN I, POC: 0 ng/mL (ref 0.00–0.08)

## 2013-04-06 LAB — CBC WITH DIFFERENTIAL/PLATELET
Basophils Absolute: 0.1 10*3/uL (ref 0.0–0.1)
Basophils Relative: 1 % (ref 0–1)
EOS ABS: 0.3 10*3/uL (ref 0.0–0.7)
Eosinophils Relative: 4 % (ref 0–5)
HCT: 34.5 % — ABNORMAL LOW (ref 36.0–46.0)
Hemoglobin: 11.6 g/dL — ABNORMAL LOW (ref 12.0–15.0)
Lymphocytes Relative: 34 % (ref 12–46)
Lymphs Abs: 2.4 10*3/uL (ref 0.7–4.0)
MCH: 32.8 pg (ref 26.0–34.0)
MCHC: 33.6 g/dL (ref 30.0–36.0)
MCV: 97.5 fL (ref 78.0–100.0)
Monocytes Absolute: 0.6 10*3/uL (ref 0.1–1.0)
Monocytes Relative: 9 % (ref 3–12)
NEUTROS PCT: 52 % (ref 43–77)
Neutro Abs: 3.8 10*3/uL (ref 1.7–7.7)
Platelets: 229 10*3/uL (ref 150–400)
RBC: 3.54 MIL/uL — ABNORMAL LOW (ref 3.87–5.11)
RDW: 13.9 % (ref 11.5–15.5)
WBC: 7.2 10*3/uL (ref 4.0–10.5)

## 2013-04-06 MED ORDER — BUPROPION HCL ER (XL) 150 MG PO TB24
150.0000 mg | ORAL_TABLET | Freq: Every day | ORAL | Status: DC
  Administered 2013-04-07: 150 mg via ORAL
  Filled 2013-04-06: qty 1

## 2013-04-06 MED ORDER — LORAZEPAM 1 MG PO TABS: 0.0000 mg | ORAL_TABLET | Freq: Two times a day (BID) | ORAL | Status: DC

## 2013-04-06 MED ORDER — LORAZEPAM 1 MG PO TABS
0.0000 mg | ORAL_TABLET | Freq: Four times a day (QID) | ORAL | Status: DC
  Administered 2013-04-07: 1 mg via ORAL
  Filled 2013-04-06 (×2): qty 1

## 2013-04-06 MED ORDER — FLUOXETINE HCL 20 MG PO CAPS
20.0000 mg | ORAL_CAPSULE | Freq: Every day | ORAL | Status: DC
  Filled 2013-04-06: qty 1

## 2013-04-06 MED ORDER — ACETAMINOPHEN 325 MG PO TABS
650.0000 mg | ORAL_TABLET | ORAL | Status: DC | PRN
  Administered 2013-04-07: 650 mg via ORAL
  Filled 2013-04-06: qty 2

## 2013-04-06 MED ORDER — IBUPROFEN 200 MG PO TABS
600.0000 mg | ORAL_TABLET | Freq: Three times a day (TID) | ORAL | Status: DC | PRN
  Administered 2013-04-07: 600 mg via ORAL
  Filled 2013-04-06: qty 3

## 2013-04-06 MED ORDER — ALBUTEROL SULFATE HFA 108 (90 BASE) MCG/ACT IN AERS
2.0000 | INHALATION_SPRAY | Freq: Four times a day (QID) | RESPIRATORY_TRACT | Status: DC | PRN
  Administered 2013-04-07 (×2): 2 via RESPIRATORY_TRACT
  Filled 2013-04-06: qty 6.7

## 2013-04-06 MED ORDER — HYDROCODONE-ACETAMINOPHEN 5-325 MG PO TABS
2.0000 | ORAL_TABLET | ORAL | Status: DC | PRN
  Filled 2013-04-06: qty 2

## 2013-04-06 MED ORDER — HYDROXYZINE HCL 25 MG PO TABS
25.0000 mg | ORAL_TABLET | Freq: Three times a day (TID) | ORAL | Status: DC | PRN
  Filled 2013-04-06: qty 1

## 2013-04-06 NOTE — Telephone Encounter (Signed)
Patient is requesting two inhalers and a steroid for breathing.  [phone]

## 2013-04-06 NOTE — Progress Notes (Signed)
Please refer to the telephone note from the date of this revisit.  This patient has demonstrated unusual and inappropriate behavior, wandering about the office. The patient-physician relationship is strained. Ongoing followup with this patient is not in the best interest of this patient and our practice.  I have asked that this patient be discharged from our practice. As it turns out, the patient was discharged from our practice previously, but this was not realized until after she had made contact again through our office.  The patient has been discharged from several other practices as well.

## 2013-04-06 NOTE — ED Notes (Signed)
Pt brought in to ER via [employer] under IVC; Per IVC pt has been abusing alcohol and mother reports that she has called numerous times stating that she was going to kill herself; pt cooperative upon arrival; pt states that she is a patient at [facility] and has history of mental commitments; pt denies SI / HI upon arrival states that her mother is just harassing her.

## 2013-04-06 NOTE — Telephone Encounter (Signed)
Advised pt to come in she is complaining that we did not treat her correctly the first time she would be feeling better and that she wants a inhaler and some steroids called in now.  She states that she is having a hard time breathing and waking up in the middle of the night and cannot breathe, pain in her chest that radiates to her back.  Advised her that she needs to come in and pt gets upset and states that if we treated her correctly then she would not have to come back in.  Pt seems very frustrated and does not want to want to come in at all.  She was also very demanding and very mean on the phone.  In previous a  conversation with her she was stating the same thing, see previous phone message.  If we can help her PLEASE do.

## 2013-04-06 NOTE — ED Notes (Signed)
Pt denies speaking with Mother tonight, denies SI/HI. Flight of ideas noted. Pt compliant with request at this time. Pt is IVC. Pt states she is having legal issues with custody/visitation and uncontrolled MS.

## 2013-04-06 NOTE — ED Provider Notes (Signed)
CSN: [id number]     Arrival date & time 04/06/13  2140 History   First MD Initiated Contact with Patient 04/06/13 2154     Chief Complaint  Patient presents with  . Medical Clearance     (Consider location/radiation/quality/duration/timing/severity/associated sxs/prior Treatment) The history is provided by the patient and the EMS personnel. The history is limited by the condition of the patient.  Anne Black is a 53 y.o. female hx of multiple sclerosis, anxiety, bipolar here with suicidal ideations. She called her mom many time stating that she is going to kill herself. She states that she has been very depressed because of her vehicle troubles as well as custody problems with her kids. When asked about suicidal ideation, she states that she "always has a way out" but denies any suicidal plans. Denies any hallucinations. She was brought in by police and was IVC by her mother. She has been drinking alcohol chronically. Upon review of further notes, she was fired by her neurologist this past week because she was wandering around the rooms. She is complaining of some vague chest pain for the last day or so. Denies shortness of breath. Denies nausea or vomiting or fevers.    Level V caveat- condition of patient   Past Medical History  Diagnosis Date  . Multiple sclerosis   . Arthritis   . Anxiety   . Bipolar disorder   . ADHD (attention deficit hyperactivity disorder)    Past Surgical History  Procedure Laterality Date  . Cesarean section    . Myringotomy with tube placement Bilateral   . Tonsillectomy     Family History  Problem Relation Age of Onset  . Heart disease Mother    History  Substance Use Topics  . Smoking status: Current Every Day Smoker -- 1.50 packs/day for 20 years    Types: Cigarettes  . Smokeless tobacco: Never Used  . Alcohol Use: 4.4 oz/week    4 Drinks containing 0.5 oz of alcohol, 2 Glasses of wine, 2 Cans of beer per week     Comment: pt states she  drinks every 2-3 days   OB History   Grav Para Term Preterm Abortions TAB SAB Ect Mult Living                 Review of Systems  Cardiovascular: Positive for chest pain.  Psychiatric/Behavioral: Positive for dysphoric mood.  All other systems reviewed and are negative.      Allergies  Review of patient's allergies indicates no known allergies.  Home Medications   Current Outpatient Rx  Name  Route  Sig  Dispense  Refill  . albuterol (PROVENTIL HFA;VENTOLIN HFA) 108 (90 BASE) MCG/ACT inhaler   Inhalation   Inhale 2 puffs into the lungs every 6 (six) hours as needed for wheezing.         Marland Kitchen buPROPion (WELLBUTRIN XL) 150 MG 24 hr tablet   Oral   Take 1 tablet (150 mg total) by mouth daily.   30 tablet   1   . FLUoxetine (PROZAC) 20 MG capsule   Oral   Take 1 capsule (20 mg total) by mouth daily.   30 capsule   1   . HYDROcodone-acetaminophen (NORCO/VICODIN) 5-325 MG per tablet   Oral   Take 2 tablets by mouth every 4 (four) hours as needed for moderate pain.         . hydrOXYzine (VISTARIL) 25 MG capsule   Oral   Take 25 mg by  mouth 3 (three) times daily as needed for anxiety or itching.         Marland Kitchen. ibuprofen (ADVIL,MOTRIN) 200 MG tablet   Oral   Take 600-800 mg by mouth every 6 (six) hours as needed.         . meloxicam (MOBIC) 15 MG tablet   Oral   Take 1 tablet (15 mg total) by mouth daily.   90 tablet   1    BP 130/67  Pulse 91  Temp(Src) 98 F (36.7 C) (Oral)  Resp 20  SpO2 100% Physical Exam  Nursing note and vitals reviewed. Constitutional: She is oriented to person, place, and time.  Slightly agitated   HENT:  Head: Normocephalic.  Mouth/Throat: Oropharynx is clear and moist.  Eyes: Conjunctivae are normal. Pupils are equal, round, and reactive to light.  Neck: Normal range of motion. Neck supple.  Cardiovascular: Normal rate, regular rhythm and normal heart sounds.   Pulmonary/Chest: Effort normal and breath sounds normal. No  respiratory distress. She has no wheezes. She has no rales.  Abdominal: Soft. Bowel sounds are normal. She exhibits no distension. There is no tenderness. There is no rebound.  Musculoskeletal: Normal range of motion. She exhibits no edema and no tenderness.  Neurological: She is alert and oriented to person, place, and time.  Skin: Skin is warm and dry.  Psychiatric:  Poor judgment, depressed     ED Course  Procedures (including critical care time) Labs Review Labs Reviewed  CBC WITH DIFFERENTIAL - Abnormal; Notable for the following:    RBC 3.54 (*)    Hemoglobin 11.6 (*)    HCT 34.5 (*)    All other components within normal limits  COMPREHENSIVE METABOLIC PANEL  ETHANOL  SALICYLATE LEVEL  ACETAMINOPHEN LEVEL  URINE RAPID DRUG SCREEN (HOSP PERFORMED)  Rosezena SensorI-STAT TROPOININ, ED   Imaging Review Dg Chest 2 View  04/06/2013   CLINICAL DATA:  Right mid/lower chest pain, cough  EXAM: CHEST  2 VIEW  COMPARISON:  03/22/2012  FINDINGS: Lungs are essentially clear. No focal consolidation. No pleural effusion or pneumothorax.  The heart is normal size.  Visualized osseous structures are within normal limits.  IMPRESSION: No evidence of acute cardiopulmonary disease.   Electronically Signed   By: Charline BillsSriyesh  Krishnan M.D.   On: 04/06/2013 23:24     EKG Interpretation   Date/Time:  Friday April 06 2013 22:21:00 EST Ventricular Rate:  83 PR Interval:  161 QRS Duration: 87 QT Interval:  368 QTC Calculation: 432 R Axis:   81 Text Interpretation:  Sinus rhythm No significant change since last  tracing Confirmed by YAO  MD, DAVID ([id number]) on 04/06/2013 10:59:09 PM      MDM   Final diagnoses:  None   Leta SpellerCarla C Black is a 53 y.o. female here with suicidal ideation, chest pain. I doubt ACS or PE. Will get labs, CXR, trop x 1. Will get psych labs and get psych consult.   11:56 PM CXR nl, trop neg x 1. I doubt ACS. She is under IVF, psych will see patient. Medically cleared    Richardean Canalavid H  Yao, MD 04/06/13 2356

## 2013-04-07 ENCOUNTER — Inpatient Hospital Stay (HOSPITAL_COMMUNITY)
Admission: AD | Admit: 2013-04-07 | Discharge: 2013-04-12 | DRG: 885 | Disposition: A | Discharge status: Home / Self Care | Payer: 59 | Source: Intra-hospital | Attending: Psychiatry | Admitting: Psychiatry

## 2013-04-07 ENCOUNTER — Encounter (HOSPITAL_COMMUNITY): Payer: Self-pay | Admitting: *Deleted

## 2013-04-07 DIAGNOSIS — F3110 Bipolar disorder, current episode manic without psychotic features, unspecified: Principal | ICD-10-CM | POA: Diagnosis present

## 2013-04-07 DIAGNOSIS — Z8249 Family history of ischemic heart disease and other diseases of the circulatory system: Secondary | ICD-10-CM

## 2013-04-07 DIAGNOSIS — R45851 Suicidal ideations: Secondary | ICD-10-CM

## 2013-04-07 DIAGNOSIS — Z91199 Patient's noncompliance with other medical treatment and regimen due to unspecified reason: Secondary | ICD-10-CM

## 2013-04-07 DIAGNOSIS — F3289 Other specified depressive episodes: Secondary | ICD-10-CM

## 2013-04-07 DIAGNOSIS — M129 Arthropathy, unspecified: Secondary | ICD-10-CM | POA: Diagnosis present

## 2013-04-07 DIAGNOSIS — F1010 Alcohol abuse, uncomplicated: Secondary | ICD-10-CM | POA: Diagnosis present

## 2013-04-07 DIAGNOSIS — G35 Multiple sclerosis: Secondary | ICD-10-CM | POA: Diagnosis present

## 2013-04-07 DIAGNOSIS — F411 Generalized anxiety disorder: Secondary | ICD-10-CM | POA: Diagnosis present

## 2013-04-07 DIAGNOSIS — F909 Attention-deficit hyperactivity disorder, unspecified type: Secondary | ICD-10-CM | POA: Diagnosis present

## 2013-04-07 DIAGNOSIS — F319 Bipolar disorder, unspecified: Secondary | ICD-10-CM | POA: Diagnosis present

## 2013-04-07 DIAGNOSIS — F329 Major depressive disorder, single episode, unspecified: Secondary | ICD-10-CM

## 2013-04-07 DIAGNOSIS — F332 Major depressive disorder, recurrent severe without psychotic features: Secondary | ICD-10-CM | POA: Diagnosis present

## 2013-04-07 DIAGNOSIS — Z9119 Patient's noncompliance with other medical treatment and regimen: Secondary | ICD-10-CM

## 2013-04-07 DIAGNOSIS — F17200 Nicotine dependence, unspecified, uncomplicated: Secondary | ICD-10-CM | POA: Diagnosis present

## 2013-04-07 DIAGNOSIS — F4310 Post-traumatic stress disorder, unspecified: Secondary | ICD-10-CM | POA: Diagnosis present

## 2013-04-07 LAB — COMPREHENSIVE METABOLIC PANEL
ALBUMIN: 3.2 g/dL — AB (ref 3.5–5.2)
ALT: 13 U/L (ref 0–35)
AST: 15 U/L (ref 0–37)
Alkaline Phosphatase: 67 U/L (ref 39–117)
BUN: 15 mg/dL (ref 6–23)
CO2: 21 mEq/L (ref 19–32)
Calcium: 8.8 mg/dL (ref 8.4–10.5)
Chloride: 101 mEq/L (ref 96–112)
Creatinine, Ser: 0.76 mg/dL (ref 0.50–1.10)
GFR calc Af Amer: >90 mL/min (ref >90)
GFR calc non Af Amer: >90 mL/min (ref >90)
Glucose, Bld: 91 mg/dL (ref 70–99)
Potassium: 3.9 mEq/L (ref 3.7–5.3)
SODIUM: 136 meq/L — AB (ref 137–147)
TOTAL PROTEIN: 6.1 g/dL (ref 6.0–8.3)
Total Bilirubin: <0.2 mg/dL — ABNORMAL LOW (ref 0.3–1.2)

## 2013-04-07 LAB — ETHANOL: Alcohol, Ethyl (B): 44 mg/dL — ABNORMAL HIGH (ref 0–11)

## 2013-04-07 LAB — RAPID URINE DRUG SCREEN, HOSP PERFORMED
Amphetamines: NOT DETECTED
Barbiturates: NOT DETECTED
Benzodiazepines: NOT DETECTED
Cocaine: NOT DETECTED
OPIATES: NOT DETECTED
TETRAHYDROCANNABINOL: NOT DETECTED

## 2013-04-07 LAB — SALICYLATE LEVEL

## 2013-04-07 LAB — ACETAMINOPHEN LEVEL

## 2013-04-07 MED ORDER — GUAIFENESIN 100 MG/5ML PO SYRP
200.0000 mg | ORAL_SOLUTION | ORAL | Status: DC | PRN
  Administered 2013-04-08: 200 mg via ORAL
  Filled 2013-04-07: qty 10

## 2013-04-07 MED ORDER — OLANZAPINE 10 MG IM SOLR
10.0000 mg | Freq: Once | INTRAMUSCULAR | Status: AC
  Administered 2013-04-07: 10 mg via INTRAMUSCULAR
  Filled 2013-04-07: qty 10

## 2013-04-07 MED ORDER — FLUOXETINE HCL 20 MG PO CAPS
20.0000 mg | ORAL_CAPSULE | Freq: Every day | ORAL | Status: DC
  Administered 2013-04-08 – 2013-04-09 (×2): 20 mg via ORAL
  Filled 2013-04-07 (×5): qty 1

## 2013-04-07 MED ORDER — MOMETASONE FURO-FORMOTEROL FUM 100-5 MCG/ACT IN AERO
2.0000 | INHALATION_SPRAY | Freq: Two times a day (BID) | RESPIRATORY_TRACT | Status: DC
  Administered 2013-04-07: 2 via RESPIRATORY_TRACT
  Filled 2013-04-07: qty 8.8

## 2013-04-07 MED ORDER — BUPROPION HCL ER (XL) 150 MG PO TB24
150.0000 mg | ORAL_TABLET | Freq: Every day | ORAL | Status: DC
  Administered 2013-04-08 – 2013-04-10 (×3): 150 mg via ORAL
  Filled 2013-04-07 (×5): qty 1

## 2013-04-07 MED ORDER — NICOTINE 21 MG/24HR TD PT24
21.0000 mg | MEDICATED_PATCH | Freq: Once | TRANSDERMAL | Status: DC
  Administered 2013-04-07: 21 mg via TRANSDERMAL
  Filled 2013-04-07: qty 1

## 2013-04-07 MED ORDER — ALBUTEROL SULFATE HFA 108 (90 BASE) MCG/ACT IN AERS
2.0000 | INHALATION_SPRAY | Freq: Four times a day (QID) | RESPIRATORY_TRACT | Status: DC | PRN
  Administered 2013-04-08 – 2013-04-11 (×4): 2 via RESPIRATORY_TRACT
  Filled 2013-04-07 (×2): qty 6.7

## 2013-04-07 MED ORDER — TRAZODONE HCL 50 MG PO TABS
50.0000 mg | ORAL_TABLET | Freq: Every evening | ORAL | Status: DC | PRN
Start: 2013-04-07 — End: 2013-04-12
  Administered 2013-04-07 – 2013-04-12 (×5): 50 mg via ORAL
  Filled 2013-04-07 (×4): qty 1
  Filled 2013-04-07: qty 4
  Filled 2013-04-07: qty 1

## 2013-04-07 MED ORDER — ALUM & MAG HYDROXIDE-SIMETH 200-200-20 MG/5ML PO SUSP: 30.0000 mL | ORAL | Status: DC | PRN

## 2013-04-07 MED ORDER — HYDROXYZINE HCL 25 MG PO TABS
25.0000 mg | ORAL_TABLET | Freq: Four times a day (QID) | ORAL | Status: DC | PRN
  Administered 2013-04-08 – 2013-04-09 (×3): 25 mg via ORAL
  Filled 2013-04-07 (×3): qty 1

## 2013-04-07 MED ORDER — BENZONATATE 100 MG PO CAPS
200.0000 mg | ORAL_CAPSULE | Freq: Two times a day (BID) | ORAL | Status: DC | PRN
  Administered 2013-04-07: 200 mg via ORAL
  Filled 2013-04-07: qty 2

## 2013-04-07 MED ORDER — ACETAMINOPHEN 325 MG PO TABS
650.0000 mg | ORAL_TABLET | Freq: Four times a day (QID) | ORAL | Status: DC | PRN
  Administered 2013-04-08 (×2): 650 mg via ORAL
  Filled 2013-04-07 (×2): qty 2

## 2013-04-07 MED ORDER — HYDROXYZINE PAMOATE 25 MG PO CAPS
25.0000 mg | ORAL_CAPSULE | Freq: Three times a day (TID) | ORAL | Status: DC | PRN
  Filled 2013-04-07: qty 1

## 2013-04-07 MED ORDER — MELOXICAM 15 MG PO TABS
15.0000 mg | ORAL_TABLET | Freq: Every day | ORAL | Status: DC
  Administered 2013-04-08 – 2013-04-12 (×5): 15 mg via ORAL
  Filled 2013-04-07 (×2): qty 2
  Filled 2013-04-07 (×7): qty 1

## 2013-04-07 MED ORDER — MAGNESIUM HYDROXIDE 400 MG/5ML PO SUSP: 30.0000 mL | Freq: Every day | ORAL | Status: DC | PRN

## 2013-04-07 NOTE — BH Assessment (Signed)
Consulted with Alberteen SamFran Hobson, NP who recommended that pt be assessed by psychiatry in the am. Dr. Theron AristaPeter was notified.

## 2013-04-07 NOTE — Progress Notes (Signed)
Patient ID: Anne Black, female   DOB: June 03, 1960, 53 y.o.   MRN: [id number] Pt is an involuntary first time admit to [facility]. She has been a patient at [facility] two years ago. Pt. has a medical hx of : MS, asthma, anxiety, Bipolar, ADHD and tonsillectomy. Pt stated,"my mom took out papers on me stating I told her I was going to kill myself" Pt does appear in a manic state and offered to run the AA group the moment she was brought on the [address] hall.Pt appears very dishelved with her hair matted and her nails and hands very dirty. Pt did state her twin 22 year old daughters were taken away from  her two years ago as well as her 54 year old triplets. She states,"I do not know why." Pt denies alcohol usage however she was arrested several days ago for a DUI. The pt. 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 She presently is unemployed. Pt smokes one pck. of cigarettes a day.

## 2013-04-07 NOTE — ED Notes (Signed)
TTS assessment in progress at present.

## 2013-04-07 NOTE — ED Notes (Signed)
Dr Elsie Saas nad josephine NP into see

## 2013-04-07 NOTE — ED Notes (Signed)
Pt transferred from main ed, presents with complaint of rapid speech pattern, and SI, no plan as per IVC papers.  Pt reports her mother wrongfully took out IVC papers on her.  Smell of ETOH noted.  Admits to hx of Bipolar DO and ADHD. Pt reports she was drinking wine & beer tonight.  Pt uncooperative and talking loudly at present.  Coughing loudly noted also.

## 2013-04-07 NOTE — ED Notes (Signed)
Pt ambulatory to [facility] w/ [employer], belongings given to sheriff

## 2013-04-07 NOTE — BH Assessment (Signed)
Received a call for tele-assessment. Spoke with Dr. Lynelle Doctor who stated that pt was telling her mom that she wanted to kill herself. Pt has been going through some custody issues with the kids. Pt denies a plan; however mom completed the IVC paperwork. Pt is a chronic alcoholic. Tele-assessment will be initiated.

## 2013-04-07 NOTE — ED Notes (Signed)
Up to the bathroom 

## 2013-04-07 NOTE — ED Notes (Addendum)
confirmed rx for zithromax at [facility] [address] filled 2/24- dr bagger.  Dr Renae GlossShelton into see

## 2013-04-07 NOTE — Progress Notes (Signed)
[facility] Group Notes:  (Nursing/MHT/Case Management/Adjunct)  Date:  04/07/2013  Time:  8:00 p.m.   Type of Therapy:  Psychoeducational Skills  Participation Level:  None  Participation Quality:  Resistant  Affect:  Flat  Cognitive:  Lacking  Insight:  None  Engagement in Group:  None  Modes of Intervention:  Education  Summary of Progress/Problems: The patient attended group but had nothing to share.   Hazle CocaGOODMAN, Ramzy Cappelletti S 04/07/2013, 9:03 PM

## 2013-04-07 NOTE — Progress Notes (Signed)
Spoke with PA, Leonette Mostharles , who stated he would review pts chart and put in orders. Pt stated she is used to taking ventolin and dulera for her asthma. Pt did attend the AA meeting on the 300 hall. She denies SI and HI and contracts for safety. Pt has no N?V, no tremors and is not sweating.

## 2013-04-07 NOTE — ED Notes (Signed)
Laying quielty on the bed, nad, breath sounds CTA bilat.  Pt also reports that she has a ear infection (r) that she was rx's zithromax  For but has not picked up the rx yet, will inform EDP.

## 2013-04-07 NOTE — Progress Notes (Signed)
Patient ID: Anne Black, female   DOB: Mar 25, 1960, 53 y.o.   MRN: [id number] Contacted by floor nurse.pt admitted without orders and requesting 2 medications not on Home Med  List:Dulera and Vyvance.These will not be ordered.  Reviewed the information documented and agree with the treatment plan.  Jafeth Mustin,JANARDHAHA R. 04/07/2013 8:42 PM

## 2013-04-07 NOTE — Consult Note (Signed)
Diagnosis: Bipolar d/o This is a caucasian female who was IVC by her mother for suicidal ideation.  Her mother called EMS who brought patient to the ER.  Patient today during rounds with Dr Elsie Saas states she does not know why her mother committed her.  She states she has a lot of legal issues including losing her 5 children and car problems.  She also admits to not taking her medications regularly.  Patient was also fired recently by her neurologist due to her behavior at the clinic.  Patient has had issues with alcohol but states she does not think her drinking is an issue for her.  During the interview patient was rambling from one topic to another and she did not want [place] to talk to her mother.  She denies SI/HI/AVH.   We have accepted patient for admission for safety and stabilization.  Plan: Admit to inpatient Psychiatric unit.  Dahlia Byes  PMHNP-BC  Patient was seen face to face for psychiatric evaluation, case discussed with physician extender and reviewed the information documented and agree with the treatment plan.  Demetric Parslow,JANARDHAHA R. 04/07/2013 8:40 PM

## 2013-04-07 NOTE — ED Notes (Signed)
Sheriff contacted for transport 
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
## 2013-04-07 NOTE — ED Notes (Signed)
Pt constantly coming to desk, talking loudly and coughing. Requesting antibiotic for cough.  Pt very manic with rapid speech.  NP Drenda FreezeFran notified.

## 2013-04-07 NOTE — ED Notes (Signed)
Up to the desk, reports that her mother "is mean" and does not want her to get custody of her children,she also reports that she has to be in court on Monday, and that today is her visitation day with her daughters.  Pt also reports cough and that she needs to be on an antibotic.

## 2013-04-07 NOTE — ED Notes (Addendum)
Pt upset about not being dc'd.  Pt reports that she is not suicidal and that her mother is doing this because she is "vindictive".  Pt requesting to speak to the psychatrist about being held.  Julieanne Cotton NP aware.

## 2013-04-07 NOTE — ED Notes (Addendum)
Pt reports cough slt productive, Breath sounds CTA, diminished biltat.  Pt also reports that she uses a advir inhaler at home and has had to be on steroids in the past.  Pt requesting inhaler, antibiotic, and cough medication.  Dr Renae Gloss updated

## 2013-04-07 NOTE — ED Notes (Signed)
Up to the desk on the phone 

## 2013-04-08 ENCOUNTER — Encounter (HOSPITAL_COMMUNITY): Payer: Self-pay | Admitting: Psychiatry

## 2013-04-08 DIAGNOSIS — F411 Generalized anxiety disorder: Secondary | ICD-10-CM

## 2013-04-08 DIAGNOSIS — F3110 Bipolar disorder, current episode manic without psychotic features, unspecified: Principal | ICD-10-CM

## 2013-04-08 DIAGNOSIS — F1010 Alcohol abuse, uncomplicated: Secondary | ICD-10-CM

## 2013-04-08 DIAGNOSIS — R45851 Suicidal ideations: Secondary | ICD-10-CM

## 2013-04-08 DIAGNOSIS — F4310 Post-traumatic stress disorder, unspecified: Secondary | ICD-10-CM

## 2013-04-08 MED ORDER — RISPERIDONE 1 MG PO TABS
1.0000 mg | ORAL_TABLET | Freq: Two times a day (BID) | ORAL | Status: DC
  Administered 2013-04-08 – 2013-04-10 (×4): 1 mg via ORAL
  Filled 2013-04-08 (×6): qty 1

## 2013-04-08 MED ORDER — NICOTINE 21 MG/24HR TD PT24
21.0000 mg | MEDICATED_PATCH | Freq: Every day | TRANSDERMAL | Status: DC
  Administered 2013-04-08: 21 mg via TRANSDERMAL
  Filled 2013-04-08 (×3): qty 1

## 2013-04-08 MED ORDER — AZITHROMYCIN 500 MG PO TABS
500.0000 mg | ORAL_TABLET | Freq: Every day | ORAL | Status: AC
  Administered 2013-04-08: 500 mg via ORAL
  Filled 2013-04-08: qty 1
  Filled 2013-04-08: qty 2

## 2013-04-08 MED ORDER — GUAIFENESIN ER 600 MG PO TB12
600.0000 mg | ORAL_TABLET | Freq: Two times a day (BID) | ORAL | Status: DC
  Administered 2013-04-08 – 2013-04-12 (×8): 600 mg via ORAL
  Filled 2013-04-08 (×4): qty 1
  Filled 2013-04-08: qty 8
  Filled 2013-04-08 (×2): qty 1
  Filled 2013-04-08: qty 8
  Filled 2013-04-08 (×4): qty 1

## 2013-04-08 MED ORDER — AZITHROMYCIN 250 MG PO TABS
250.0000 mg | ORAL_TABLET | Freq: Every day | ORAL | Status: AC
  Administered 2013-04-09 – 2013-04-12 (×4): 250 mg via ORAL
  Filled 2013-04-08 (×4): qty 1

## 2013-04-08 MED ORDER — NICOTINE POLACRILEX 2 MG MT GUM
2.0000 mg | CHEWING_GUM | OROMUCOSAL | Status: DC | PRN
  Administered 2013-04-08: 2 mg via ORAL

## 2013-04-08 MED ORDER — BENZONATATE 100 MG PO CAPS
200.0000 mg | ORAL_CAPSULE | Freq: Three times a day (TID) | ORAL | Status: DC | PRN
  Administered 2013-04-08 – 2013-04-09 (×3): 200 mg via ORAL
  Filled 2013-04-08 (×3): qty 2

## 2013-04-08 MED ORDER — MODAFINIL 200 MG PO TABS
200.0000 mg | ORAL_TABLET | Freq: Every day | ORAL | Status: DC
  Administered 2013-04-08 – 2013-04-09 (×2): 200 mg via ORAL
  Filled 2013-04-08 (×2): qty 1

## 2013-04-08 MED ORDER — CHLORDIAZEPOXIDE HCL 25 MG PO CAPS
25.0000 mg | ORAL_CAPSULE | Freq: Three times a day (TID) | ORAL | Status: DC | PRN
  Administered 2013-04-08 (×2): 25 mg via ORAL
  Filled 2013-04-08 (×2): qty 1

## 2013-04-08 MED ORDER — ACETAMINOPHEN 325 MG PO TABS
650.0000 mg | ORAL_TABLET | ORAL | Status: DC | PRN
  Administered 2013-04-08 – 2013-04-11 (×4): 650 mg via ORAL
  Filled 2013-04-08 (×5): qty 2
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
## 2013-04-08 NOTE — Telephone Encounter (Signed)
Pt was not seen since 02/18/2013 and it looks like she is currently hospitalized.  Due to her recent hospital visit steroids are not a good idea - if she is in the hospital they will be able to evaluate her and Rx appropriate medications and it looks like she has an inhaler that she has if she needs it.

## 2013-04-08 NOTE — Telephone Encounter (Signed)
error 

## 2013-04-08 NOTE — [facility] Suicide Risk Assessment (Signed)
Suicide Risk Assessment  Admission Assessment     Nursing information obtained from:  Patient Demographic factors:  Divorced or widowed;Caucasian;Low socioeconomic status Current Mental Status:   deneis si but she is disorgnaized, no hi, mood is sad Loss Factors:  Loss of significant relationship;Decline in physical health;Legal issues;Financial problems / change in socioeconomic status Historical Factors:    mutiple stressors Risk Reduction Factors:  Sense of responsibility to family;Religious beliefs about death Total Time spent with patient: 30 minutes  CLINICAL FACTORS:   Currently Psychotic    COGNITIVE FEATURES THAT CONTRIBUTE TO RISK:  Loss of executive function    SUICIDE RISK:   Moderate:  Frequent suicidal ideation with limited intensity, and duration, some specificity in terms of plans, no associated intent, good self-control, limited dysphoria/symptomatology, some risk factors present, and identifiable protective factors, including available and accessible social support.  PLAN OF CARE:  Continue current meds  I certify that inpatient services furnished can reasonably be expected to improve the patient's condition.  Wonda Cerise 04/08/2013, 12:27 PM

## 2013-04-08 NOTE — Progress Notes (Signed)
D.  Pt labile on approach states that there needs to be more resources available to the patients such as a Engineering geologistlibrary and reading glasses.  Also wishes that patients had more variety of snacks.  Denies SI/HI/hallucinations at this time.  Positive for evening group, intrusive on the unit.  Coughing frequently and must be reminded to cover mouth.  A.  Support and encouragement offered, encouraged to write down suggestions for improvement of services.  [facility], order received.  R.  Pt remains safe on unit, will continue to monitor.

## 2013-04-08 NOTE — [facility] Group Notes (Signed)
[facility] Group Notes:  (Clinical Social Work)  04/08/2013   1:15-2:15PM  Summary of Progress/Problems:  The main focus of today's process group was to   identify the patient's current support system and decide on other supports that can be put in place.  The picture on workbook was used to discuss why additional supports are needed.  An emphasis was placed on using counselor, doctor, therapy groups, 12-step groups, and problem-specific support groups to expand supports.   There was also an extensive discussion about what constitutes a healthy support versus an unhealthy support.  The patient expressed full comprehension of the concepts presented, and agreed that there is a need to add more supports.  The patient has a number of ideas about positive supports, but is not currently using them.    Type of Therapy:  Process Group  Participation Level:  Active  Participation Quality:  Attentive and Sharing  Affect:  Blunted  Cognitive:  Oriented, but Disorganized  Insight:  Developing/Improving  Engagement in Therapy:  Engaged  Modes of Intervention:  Education,  Support and [employer], LCSW 04/08/2013, 4:00pm

## 2013-04-08 NOTE — Progress Notes (Signed)
Psychoeducational Group Note  Psychoeducational Group Note  Date: 04/08/2013 Time:  0930  Group Topic/Focus:  Gratefulness:  The focus of this group is to help patients identify what two things they are most grateful for in their lives. What helps ground them and to center them on their work to their recovery.  Participation Level:  Active  Participation Quality:  Appropriate  Affect:  Appropriate  Cognitive:  Oriented  Insight:  improving  Engagement in Group:  Engaged  Additional Comments:    Dione Housekeeper

## 2013-04-08 NOTE — Progress Notes (Signed)
Late Entry for 04-07-13 Entered on 04-08-13  .Psychoeducational Group Note    Date: 04/08/2013 Time: 0930   Goal Setting Purpose of Group: To be able to set a goal that is measurable and that can be accomplished in one day Participation Level:  Active  Participation Quality:  Appropriate  Affect:  Appropriate  Cognitive:  Oriented  Insight:  Improving  Engagement in Group:  Engaged  Additional Comments:    Lynesha Bango A 

## 2013-04-08 NOTE — Progress Notes (Signed)
Late entry for 04-07-13 Entered on 04-08-13  Psychoeducational Group Note  Date: 04/08/2013 Time:  1015  Group Topic/Focus:  Identifying Needs:   The focus of this group is to help patients identify their personal needs that have been historically problematic and identify healthy behaviors to address their needs.  Participation Level:  Active  Participation Quality:  Appropriate  Affect:  Appropriate  Cognitive:  Oriented  Insight:  Improving  Engagement in Group:  Engaged  Additional Comments:    Tomorrow Dehaas A  

## 2013-04-08 NOTE — H&P (Signed)
  Pt was seen by me today and I agree with the key elements documented in H&P.  

## 2013-04-08 NOTE — Progress Notes (Signed)
Psychoeducational Group Note  Date:  04/08/2013 Time:  8:00 p.m.   Group Topic/Focus:  Wrap-Up Group:   The focus of this group is to help patients review their daily goal of treatment and discuss progress on daily workbooks.  Participation Level: Did Not Attend  Participation Quality:  Not Applicable  Affect:  Not Applicable  Cognitive:  Not Applicable  Insight:  Not Applicable  Engagement in Group: Not Applicable  Additional Comments:  The patient did not attend group since she was asleep in her room.   Hazle Coca S 04/08/2013, 8:43 PM

## 2013-04-08 NOTE — Clinical Social Work Note (Signed)
At patient's request, CSW provided clothing to patient from the clothing closet, including light shirt.  Sarina Ser 04/08/2013 6:11 PM
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
## 2013-04-08 NOTE — Tx Team (Signed)
Initial Interdisciplinary Treatment Plan  PATIENT STRENGTHS: (choose at least two) Active sense of humor Average or above average intelligence  PATIENT STRESSORS: Financial difficulties Health problems Legal issue Marital or family conflict Substance abuse   PROBLEM LIST: Problem List/Patient Goals Date to be addressed Date deferred Reason deferred Estimated date of resolution  Suicidal ideation 04/08/13     Depression 04/08/13     anxiety 04/08/13     Substance abuse 04/08/13                                    DISCHARGE CRITERIA:  Improved stabilization in mood, thinking, and/or behavior Medical problems require only outpatient monitoring Motivation to continue treatment in a less acute level of care Reduction of life-threatening or endangering symptoms to within safe limits Verbal commitment to aftercare and medication compliance  PRELIMINARY DISCHARGE PLAN: Outpatient therapy Return to previous living arrangement  PATIENT/FAMIILY INVOLVEMENT: This treatment plan has been presented to and reviewed with the patient, Anne Black.  The patient and family have been given the opportunity to ask questions and make suggestions.  Juliann Pares 04/08/2013, 5:45 AM

## 2013-04-08 NOTE — Progress Notes (Signed)
Psychoeducational Group Note  Date: 04/08/2013 Time:  0930  Group Topic/Focus:  Gratefulness:  The focus of this group is to help patients identify what two things they are most grateful for in their lives. What helps ground them and to center them on their work to their recovery.  Participation Level:  Active  Participation Quality:  Appropriate  Affect:  Appropriate  Cognitive:  Oriented  Insight:  improving  Engagement in Group:  Engaged  Additional Comments:    Anne Black A   
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
## 2013-04-09 DIAGNOSIS — F411 Generalized anxiety disorder: Secondary | ICD-10-CM

## 2013-04-09 DIAGNOSIS — F3160 Bipolar disorder, current episode mixed, unspecified: Secondary | ICD-10-CM

## 2013-04-09 MED ORDER — MODAFINIL 200 MG PO TABS
100.0000 mg | ORAL_TABLET | Freq: Every day | ORAL | Status: AC
  Administered 2013-04-10: 100 mg via ORAL
  Filled 2013-04-09: qty 1

## 2013-04-09 MED ORDER — NICOTINE 21 MG/24HR TD PT24
21.0000 mg | MEDICATED_PATCH | Freq: Every day | TRANSDERMAL | Status: DC
  Filled 2013-04-09 (×2): qty 1

## 2013-04-09 MED ORDER — AMANTADINE HCL 100 MG PO CAPS
100.0000 mg | ORAL_CAPSULE | Freq: Two times a day (BID) | ORAL | Status: DC
  Administered 2013-04-10 – 2013-04-12 (×6): 100 mg via ORAL
  Filled 2013-04-09: qty 14
  Filled 2013-04-09 (×5): qty 1
  Filled 2013-04-09: qty 14
  Filled 2013-04-09 (×3): qty 1

## 2013-04-09 MED ORDER — NICOTINE POLACRILEX 2 MG MT GUM
2.0000 mg | CHEWING_GUM | OROMUCOSAL | Status: DC | PRN
  Administered 2013-04-09 – 2013-04-11 (×11): 2 mg via ORAL
  Filled 2013-04-09 (×6): qty 1

## 2013-04-09 MED ORDER — MODAFINIL 200 MG PO TABS: 100.0000 mg | ORAL_TABLET | Freq: Every day | ORAL | Status: DC

## 2013-04-09 MED ORDER — VITAMIN B-12 1000 MCG PO TABS
1000.0000 ug | ORAL_TABLET | Freq: Every day | ORAL | Status: DC
  Administered 2013-04-09 – 2013-04-12 (×4): 1000 ug via ORAL
  Filled 2013-04-09 (×8): qty 1

## 2013-04-09 NOTE — [facility] Group Notes (Signed)
[facility] LCSW Group Therapy  04/09/2013  1:15 PM   Type of Therapy:  Group Therapy  Participation Level:  Active  Participation Quality:  Attentive, Sharing and Supportive  Affect:  Irritable, Intrusive  Cognitive:  Alert and Oriented  Insight:  Developing/Improving and Engaged  Engagement in Therapy:  Developing/Improving and Engaged  Modes of Intervention:  Clarification, Confrontation, Discussion, Education, Exploration, Limit-setting, Orientation, Problem-solving, Rapport Building, Dance movement psychotherapist, Socialization and Support  Summary of Progress/Problems: Pt identified obstacles faced currently and processed barriers involved in overcoming these obstacles. Pt identified steps necessary for overcoming these obstacles and explored motivation (internal and external) for facing these difficulties head on. Pt further identified one area of concern in their lives and chose a goal to focus on for today.  Pt sat quietly through group until the discussion became heated, in which pt than announced that in al anon this is considered cross talk and giving advice.  CSW attempted to redirect pt from this, as the group discussion was going well and pt than left the group.    Reyes Ivan, LCSW 04/09/2013 2:31 PM

## 2013-04-09 NOTE — [facility] Group Notes (Signed)
[facility] LCSW Aftercare Discharge Planning Group Note   04/09/2013 8:45 AM  Participation Quality:  Alert, Appropriate and Oriented  Mood/Affect:  Manic  Depression Rating:  Unable to rate  Anxiety Rating:  Unable to rate  Thoughts of Suicide:  Pt denies SI/HI  Will you contract for safety?   Yes  Current AVH:  Pt denies  Plan for Discharge/Comments:  Pt attended discharge planning group and actively participated in group.  CSW provided pt with today's workbook.  Pt states that she is okay today and her mood "comes in waves".  Pt states that she can return home in [place] and does not have any outpatient providers.  Pt mentioned wanting a referral to Dr. Evelene Croon or a place in [facility].  CSW will assess for appropriate referrals.  Pt was focused on needing to be in court but states a letter is not sufficient.  Pt had flight of ideas and speech was pressured and fast. No further needs voiced by pt at this time.    Transportation Means: Pt reports access to transportation - friend or family will pick pt up  Supports: No supports mentioned at this time  Reyes Ivan, LCSW 04/09/2013 10:05 AM

## 2013-04-09 NOTE — Tx Team (Signed)
Interdisciplinary Treatment Plan Update (Adult)  Date: 04/09/2013  Time Reviewed:  9:45 AM  Progress in Treatment: Attending groups: Yes Participating in groups:  Yes Taking medication as prescribed:  Yes Tolerating medication:  Yes Family/Significant othe contact made: CSW assessing  Patient understands diagnosis:  Yes Discussing patient identified problems/goals with staff:  Yes Medical problems stabilized or resolved:  Yes Denies suicidal/homicidal ideation: Yes Issues/concerns per patient self-inventory:  Yes Other:  New problem(s) identified: N/A  Discharge Plan or Barriers: CSW assessing for appropriate referrals.  Reason for Continuation of Hospitalization: Anxiety Depression Medication Stabilization  Comments: N/A  Estimated length of stay: 3-5 days  For review of initial/current patient goals, please see plan of care.  Attendees: Patient:     Family:     Physician:  Dr. Johnalagadda 04/09/2013 10:51 AM   Nursing:   Carol Davis, RN 04/09/2013 10:51 AM   Clinical Social Worker:  Darold Miley Horton, LCSW 04/09/2013 10:51 AM   Other: Conrad Withrow, PA 04/09/2013 10:51 AM   Other:  Valerie Noch, care coordination 04/09/2013 10:51 AM   Other:  Quylle Hodnett, LCSW 04/09/2013 10:51 AM   Other:  Beverly Knight, RN 04/09/2013 10:51 AM   Other:  Dana Green, RN 04/09/2013 10:53 AM   Other: Xavier Pandimakeel, LCSW 04/09/2013 10:53 AM   Other: Kim Maggio, RN 04/09/2013 10:53 AM   Other:    Other:    Other:     Scribe for Treatment Team:   Horton, Nakita [place] Nicole, 04/09/2013 10:51 AM    

## 2013-04-09 NOTE — Progress Notes (Signed)
[facility] MD Progress Note  04/09/2013 1:56 PM Anne Black  MRN:  [id number] Subjective:   During admission assessment, pt is a 53 y.o. female presenting to [facility] ED via [facility] under IVC. Per IVC pt has been abusing alcohol and mother reports that she has been called numerous times. Mother reported that pt left multiple messages stating that she was going to kill herself. Pt reported that she does not recall making any of those statements to her mother. Pt reported multiple times during this assessment that she wanted to get her meds so that she can function better. Pt reported that she has been dealing with a lot of stressors in her life such as a custody battle with her twins and being arrested for DUI. Pt also reported that she is also attempting to find a lawyer to help with her custody case.   During today's assessment, pt is minimizing anxiety and depression ratings stating that they are both at  2/10. Pt is largely in denial about her situation, repeating "I am not a psych patient and I don't need to be here" during this assessment. Pt denies SI, HI, and AVH, contracts for safety. Pt states that she never made claims about wanting to hurt herself and is ruminating about legal issues and lawyers and how "the lawyers will sort this out because they are just wanting to keep my children". Pt then began to psychoanalyze this NP stating "you are just trying to comfort yourself when you tell me all this, you aren't trying to comfort me at all". Pt was very argumentative when discussing medication interactions and concerns about taking neurostimulatory medications alongside certain mood stabilizers and how they may interfere with circadian rhythms as well as anxiety/depression symptoms. Pt was adamant that "7 other doctors can't be wrong...and "can you page Dr. Shela Commons to come discharge me tonight? I know you can send me home tonight.I don't need to be here".    Diagnosis:   DSM5: Depressive Disorders:  Major  Depressive Disorder - Severe (296.23) Total Time spent with patient: Greater than 25 minutes  Axis I: Bipolar, mixed and Generalized Anxiety Disorder Axis II: Cluster B Traits Axis III:  Past Medical History  Diagnosis Date  . Multiple sclerosis   . Arthritis   . Anxiety   . Bipolar disorder   . ADHD (attention deficit hyperactivity disorder)    Axis IV: other psychosocial or environmental problems and problems related to social environment Axis V: 41-50 serious symptoms  ADL's:  Intact  Sleep: Good  Appetite:  Good  Suicidal Ideation:  Denies Homicidal Ideation:  Denies AEB (as evidenced by):  Psychiatric Specialty Exam: Physical Exam   ROS  Blood pressure 121/83, pulse 81, temperature 96.6 F (35.9 C), temperature source Oral, resp. rate 16, height 5\' 2"  (1.575 m), weight 63.504 kg (140 lb), SpO2 98.00%.Body mass index is 25.6 kg/(m^2).  General Appearance: Casual  Eye Contact::  Good  Speech:  Clear and Coherent  Volume:  Normal  Mood:  Anxious and Depressed  Affect:  Appropriate  Thought Process:  Coherent  Orientation:  Full (Time, Place, and Person)  Thought Content:  WDL  Suicidal Thoughts:  No  Homicidal Thoughts:  No  Memory:  Immediate;   Good Recent;   Good Remote;   Good  Judgement:  Good  Insight:  Good  Psychomotor Activity:  Normal  Concentration:  Good  Recall:  Good  Fund of Knowledge:Good  Language: Good  Akathisia:  NA  Handed:  Right  AIMS (if indicated):     Assets:  Communication Skills Desire for Improvement Resilience  Sleep:  Number of Hours: 6.25   Musculoskeletal: Strength & Muscle Tone: within normal limits Gait & Station: normal Patient leans: N/A  Current Medications: Current Facility-Administered Medications  Medication Dose Route Frequency Provider Last Rate Last Dose  . acetaminophen (TYLENOL) tablet 650 mg  650 mg Oral Q4H PRN Nanine Means, NP   650 mg at 04/08/13 2338  . albuterol (PROVENTIL HFA;VENTOLIN HFA)  108 (90 BASE) MCG/ACT inhaler 2 puff  2 puff Inhalation Q6H PRN Court Joy, PA-C   2 puff at 04/08/13 [phone]  . alum & mag hydroxide-simeth (MAALOX/MYLANTA) 200-200-20 MG/5ML suspension 30 mL  30 mL Oral Q4H PRN Court Joy, PA-C      . azithromycin [facility]) tablet 250 mg  250 mg Oral Daily Nanine Means, NP   250 mg at 04/09/13 0824  . benzonatate (TESSALON) capsule 200 mg  200 mg Oral TID PRN Kristeen Mans, NP   200 mg at 04/09/13 0530  . buPROPion (WELLBUTRIN XL) 24 hr tablet 150 mg  150 mg Oral Daily Court Joy, PA-C   150 mg at 04/09/13 1191  . chlordiazePOXIDE (LIBRIUM) capsule 25 mg  25 mg Oral TID PRN Nanine Means, NP   25 mg at 04/08/13 2338  . FLUoxetine (PROZAC) capsule 20 mg  20 mg Oral Daily Court Joy, PA-C   20 mg at 04/09/13 4782  . guaiFENesin (MUCINEX) 12 hr tablet 600 mg  600 mg Oral BID Nanine Means, NP   600 mg at 04/09/13 9562  . guaifenesin (ROBITUSSIN) 100 MG/5ML syrup 200 mg  200 mg Oral Q4H PRN Court Joy, PA-C   200 mg at 04/08/13 0935  . hydrOXYzine (ATARAX/VISTARIL) tablet 25 mg  25 mg Oral Q6H PRN Court Joy, PA-C   25 mg at 04/08/13 2101  . magnesium hydroxide (MILK OF MAGNESIA) suspension 30 mL  30 mL Oral Daily PRN Court Joy, PA-C      . meloxicam [facility]) tablet 15 mg  15 mg Oral Daily Court Joy, PA-C   15 mg at 04/09/13 1308  . modafinil (PROVIGIL) tablet 200 mg  200 mg Oral Daily Nanine Means, NP   200 mg at 04/09/13 6578  . nicotine polacrilex (NICORETTE) gum 2 mg  2 mg Oral PRN Nehemiah Settle, MD   2 mg at 04/09/13 1304  . risperiDONE (RISPERDAL) tablet 1 mg  1 mg Oral BID Nanine Means, NP   1 mg at 04/09/13 0824  . traZODone (DESYREL) tablet 50 mg  50 mg Oral QHS PRN,MR X 1 Court Joy, PA-C   50 mg at 04/08/13 2338    Lab Results: No results found for this or any previous visit (from the past 48 hour(s)).  Physical Findings: AIMS: Facial and Oral Movements Muscles of Facial Expression: None,  normal Lips and Perioral Area: None, normal Jaw: None, normal Tongue: None, normal,Extremity Movements Upper (arms, wrists, hands, fingers): None, normal Lower (legs, knees, ankles, toes): None, normal, Trunk Movements Neck, shoulders, hips: None, normal, Overall Severity Severity of abnormal movements (highest score from questions above): None, normal Incapacitation due to abnormal movements: None, normal Patient's awareness of abnormal movements (rate only patient's report): No Awareness, Dental Status Current problems with teeth and/or dentures?: No Does patient usually wear dentures?: No  CIWA:  CIWA-Ar Total: 3 COWS:  COWS Total Score: 0  Treatment  Plan Summary: Daily contact with patient to assess and evaluate symptoms and progress in treatment Medication management  Plan: Review of chart, vital signs, medications, and notes.  1-Individual and group therapy  2-Medication management for depression and anxiety: Medications reviewed with the patient and she stated no untoward effects. -Discontinue Prozac 20mg  -Modify Provigil to 100mg  daily (to titrate down 200, 100, 0).  -Add Amantadine 100mg  bid for MS symptoms (better choice than Provigil dt lower interactions with psych meds) -Add Vitamin B12 1000mcg daily for energy 3-Coping skills for depression, anxiety  4-Continue crisis stabilization and management  5-Address health issues--monitoring vital signs, stable  6-Treatment plan in progress to prevent relapse of depression and anxiety  Medical Decision Making Problem Points:  Established problem, stable/improving (1), Review of last therapy session (1) and Review of psycho-social stressors (1) Data Points:  Review or order clinical lab tests (1) Review or order medicine tests (1) Review of medication regiment & side effects (2) Review of new medications or change in dosage (2)  I certify that inpatient services furnished can reasonably be expected to improve the patient's  condition.   Beau FannyWithrow, John C, FNP-BC 04/09/2013, 1:56 PM  Reviewed the information documented and agree with the treatment plan.  Ishani Goldwasser,JANARDHAHA R. 04/09/2013 6:16 PM

## 2013-04-09 NOTE — Progress Notes (Signed)
Pt states she feels anxious this evening.  Discussed medications with pt and that MD had reordered her B-12 to help give her energy.  Pt voiced understanding.  Pt has not c/o coughing this evening, nor has she been observed coughing.  Pt says she went to groups today.  She is hoping to discharge soon and wants to return home.  Pt was encouraged to make her needs known to staff.  Meds given as ordered.  Support and encouragement offered.  Safety maintained with q15 minute checks.

## 2013-04-10 MED ORDER — HYDROXYZINE HCL 50 MG PO TABS
50.0000 mg | ORAL_TABLET | Freq: Four times a day (QID) | ORAL | Status: DC | PRN
  Administered 2013-04-10: 50 mg via ORAL
  Filled 2013-04-10: qty 1

## 2013-04-10 MED ORDER — FLUTICASONE PROPIONATE 50 MCG/ACT NA SUSP: 1.0000 | Freq: Four times a day (QID) | NASAL | Status: DC | PRN

## 2013-04-10 MED ORDER — RISPERIDONE 0.5 MG PO TABS
1.5000 mg | ORAL_TABLET | Freq: Two times a day (BID) | ORAL | Status: DC
  Administered 2013-04-10 – 2013-04-12 (×3): 1.5 mg via ORAL
  Filled 2013-04-10: qty 3
  Filled 2013-04-10 (×2): qty 24
  Filled 2013-04-10 (×5): qty 3

## 2013-04-10 MED ORDER — FLUTICASONE PROPIONATE 50 MCG/ACT NA SUSP
1.0000 | Freq: Two times a day (BID) | NASAL | Status: DC
  Administered 2013-04-10 – 2013-04-12 (×4): 1 via NASAL
  Filled 2013-04-10: qty 16

## 2013-04-10 NOTE — [facility] Group Notes (Signed)
[facility] LCSW Group Therapy  04/10/2013  1:15 PM   Type of Therapy:  Group Therapy  Participation Level:  Minimal  Participation Quality:  Distracting  Affect:  Depressed and Flat  Cognitive:  Alert and Oriented  Insight:  Limited, Lacking  Engagement in Therapy:  Limited, Lacking  Modes of Intervention:  Activity, Clarification, Confrontation, Discussion, Education, Exploration, Limit-setting, Orientation, Problem-solving, Rapport Building, Dance movement psychotherapist, Socialization and Support  Summary of Progress/Problems:Patient was attentive and engaged with speaker from [employer].  Patient was attentive to speaker while they shared their story of dealing with mental health and overcoming it.  Patient expressed interest in their programs and services and received information on their agency.  Patient processed ways they can relate to the speaker.   Pt came in and out of group and appeared distracted.    Reyes Ivan, LCSW 04/10/2013 2:33 PM

## 2013-04-10 NOTE — Progress Notes (Signed)
Patient ID: Anne Black, female   DOB: 03-Oct-1960, 53 y.o.   MRN: [id number] [facility] MD Progress Note  04/10/2013 11:37 AM [name]  MRN:  [id number] Subjective:   Patient was admitted with the diagnoses of bipolar 1 disorder most recent episode is mania and she also has a history of multiple sclerosis and noncompliant with medication management. Reportedly patient was drinking alcohol and making multiple phone calls to her mother reporting suicide threats which lead to involuntary commitment petition. Patient has denies her mother's report and also has a poor judgment and insight into her current mental health condition. Patient was known for psychiatric treatment over several years. Patient has been preoccupied with aching stimulant medication for multiple sclerosis which she has been noncompliant for several months to years. She has been dealing with a lot of stressors  especially regarding custody battle with her twins (14) and being arrested for DUI.  She is also attempting to find a lawyer to help with her custody case because previous one did not fight he was properly.   Patient has been manic with the grandiose delusions, pressured speech, increased goal-directed activities, reporting depression and anxiety even though she appears to manic symptoms. Patient has been repeatedly insisting she should be discharged home without appropriate disposition plan or support system in place. 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 Patient reported she saw Dr. Evelene Croon in the past and leg seeing psychiatrist in at [facility] and currently has no psychiatrist appointments. Patient has been in denial of mental illness and denial about her situation, repeating "I am not a psych patient and I don't need to be here".  patient will be treated on by mouth date of discharge  incidental focusing on her treatment while in the hospital the staff members reported patient has been disorganized and constantly intrusive and interruptive to the therapeutic community. He is in the patient made suicidal threats while at home she has been endorsing safety contract while in the hospital. Her increased goal-directed activity thinking about  "the lawyers will sort this out because they are just wanting to keep my children". Patient  was very argumentative when discussing medication interactions and concerns about taking neurostimulatory medications alongside certain mood stabilizers and how they may interfere with circadian rhythms as well as anxiety/depression symptoms.   Diagnosis:   DSM5: Depressive Disorders:  Major Depressive Disorder - Severe (296.23) Total Time spent with patient: Greater than 25 minutes  Axis I: Bipolar, mixed and Generalized Anxiety Disorder Axis II: Cluster B Traits Axis III:  Past Medical History  Diagnosis Date  . Multiple sclerosis   . Arthritis   . Anxiety   . Bipolar disorder   . ADHD (attention deficit hyperactivity disorder)    Axis IV: other psychosocial or environmental problems and problems related to social environment Axis V: 41-50 serious symptoms  ADL's:  Intact  Sleep: Good  Appetite:  Good  Suicidal Ideation:  Denies Homicidal Ideation:  Denies AEB (as evidenced by):  Psychiatric Specialty Exam: Physical Exam   ROS  Blood pressure 115/85, pulse 87, temperature 97.3 F (36.3 C), temperature source Oral, resp. rate 20, height 5\' 2"  (1.575 m), weight 63.504 kg (140 lb), SpO2 98.00%.Body mass index is 25.6 kg/(m^2).  General Appearance: Casual  Eye Contact::  Good  Speech:  Clear and Coherent  Volume:  Normal  Mood:  Anxious  and Depressed  Affect:  Appropriate  Thought Process:  Coherent  Orientation:  Full (Time, Place, and Person)  Thought Content:  WDL  Suicidal Thoughts:  No  Homicidal Thoughts:  No  Memory:   Immediate;   Good Recent;   Good Remote;   Good  Judgement:  Good  Insight:  Good  Psychomotor Activity:  Normal  Concentration:  Good  Recall:  Good  Fund of Knowledge:Good  Language: Good  Akathisia:  NA  Handed:  Right  AIMS (if indicated):     Assets:  Communication Skills Desire for Improvement Resilience  Sleep:  Number of Hours: 6.25   Musculoskeletal: Strength & Muscle Tone: within normal limits Gait & Station: normal Patient leans: N/A  Current Medications: Current Facility-Administered Medications  Medication Dose Route Frequency Provider Last Rate Last Dose  . acetaminophen (TYLENOL) tablet 650 mg  650 mg Oral Q4H PRN Nanine MeansJamison Lord, NP   650 mg at 04/10/13 0929  . albuterol (PROVENTIL HFA;VENTOLIN HFA) 108 (90 BASE) MCG/ACT inhaler 2 puff  2 puff Inhalation Q6H PRN Court Joyharles E Kober, PA-C   2 puff at 04/10/13 [phone]  . alum & mag hydroxide-simeth (MAALOX/MYLANTA) 200-200-20 MG/5ML suspension 30 mL  30 mL Oral Q4H PRN Court Joyharles E Kober, PA-C      . amantadine (SYMMETREL) capsule 100 mg  100 mg Oral BID WC Beau FannyJohn C Withrow, FNP   100 mg at 04/10/13 [id number]  . azithromycin (ZITHROMAX) tablet 250 mg  250 mg Oral Daily Nanine MeansJamison Lord, NP   250 mg at 04/10/13 [id number]  . benzonatate (TESSALON) capsule 200 mg  200 mg Oral TID PRN Kristeen MansFran E Hobson, NP   200 mg at 04/09/13 2349  . buPROPion (WELLBUTRIN XL) 24 hr tablet 150 mg  150 mg Oral Daily Court Joyharles E Kober, PA-C   150 mg at 04/10/13 [id number]  . chlordiazePOXIDE (LIBRIUM) capsule 25 mg  25 mg Oral TID PRN Nanine MeansJamison Lord, NP   25 mg at 04/08/13 2338  . guaiFENesin (MUCINEX) 12 hr tablet 600 mg  600 mg Oral BID Nanine MeansJamison Lord, NP   600 mg at 04/10/13 [id number]  . guaifenesin (ROBITUSSIN) 100 MG/5ML syrup 200 mg  200 mg Oral Q4H PRN Court Joyharles E Kober, PA-C   200 mg at 04/08/13 0935  . hydrOXYzine (ATARAX/VISTARIL) tablet 25 mg  25 mg Oral Q6H PRN Court Joyharles E Kober, PA-C   25 mg at 04/09/13 2351  . magnesium hydroxide (MILK OF MAGNESIA) suspension 30 mL  30 mL Oral  Daily PRN Court Joyharles E Kober, PA-C      . meloxicam [facility](MOBIC) tablet 15 mg  15 mg Oral Daily Court Joyharles E Kober, PA-C   15 mg at 04/10/13 [id number]  . nicotine polacrilex (NICORETTE) gum 2 mg  2 mg Oral PRN Nehemiah SettleJanardhaha R Harim Bi, MD   2 mg at 04/10/13 [id number]  . risperiDONE (RISPERDAL) tablet 1 mg  1 mg Oral BID Nanine MeansJamison Lord, NP   1 mg at 04/10/13 [id number]  . traZODone (DESYREL) tablet 50 mg  50 mg Oral QHS PRN,MR X 1 Court Joyharles E Kober, PA-C   50 mg at 04/09/13 2349  . vitamin B-12 (CYANOCOBALAMIN) tablet 1,000 mcg  1,000 mcg Oral Daily Beau FannyJohn C Withrow, FNP   1,000 mcg at 04/10/13 [id number]    Lab Results: No results found for this or any previous visit (from the past 48 hour(s)).  Physical Findings: AIMS: Facial and Oral Movements Muscles of Facial Expression: None, normal Lips and Perioral Area: None, normal Jaw: None, normal  Tongue: None, normal,Extremity Movements Upper (arms, wrists, hands, fingers): None, normal Lower (legs, knees, ankles, toes): None, normal, Trunk Movements Neck, shoulders, hips: None, normal, Overall Severity Severity of abnormal movements (highest score from questions above): None, normal Incapacitation due to abnormal movements: None, normal Patient's awareness of abnormal movements (rate only patient's report): No Awareness, Dental Status Current problems with teeth and/or dentures?: No Does patient usually wear dentures?: No  CIWA:  CIWA-Ar Total: 3 COWS:  COWS Total Score: 0  Treatment Plan Summary: Daily contact with patient to assess and evaluate symptoms and progress in treatment Medication management  Plan: Review of chart, vital signs, medications, and notes.  1-Individual and group therapy  2-Medication management for depression and anxiety: Medications reviewed with the patient and she stated no untoward effects. Continue taper of Provigil as planned because of manic symptoms (to titrate down 200, 100, 0).  Continue Amantadine 100mg  bid for MS symptoms (better choice  due less drug to drug interactions) Increase Risperidone 1.5 mg PO BID 4 mood swings and manic symptoms Increase Hydroxyzine 50 mg Q6H PRN for anxiety Patient may benefit from adding Depakote if not responds for risperidone  Discontinue Robitussin, tessalon, Wellbutrin and Librium - either not required or not useful Continue Vitamin B12 daily for energy 3-Coping skills for depression, anxiety  4-Continue crisis stabilization and management  5-Address health issues--monitoring vital signs, stable  6-Treatment plan in progress to prevent relapse of depression and anxiety 7. Disposition plans in progress in case he be discussed in treatment team meeting tomorrow morning   Medical Decision Making Problem Points:  Established problem, stable/improving (1), Review of last therapy session (1) and Review of psycho-social stressors (1) Data Points:  Review or order clinical lab tests (1) Review or order medicine tests (1) Review of medication regiment & side effects (2) Review of new medications or change in dosage (2)  I certify that inpatient services furnished can reasonably be expected to improve the patient's condition.    Rosealynn Mateus,JANARDHAHA R. 04/10/2013 11:37 AM

## 2013-04-10 NOTE — Progress Notes (Signed)
Recreation Therapy Notes  Animal-Assisted Activity/Therapy (AAA/T) Program Checklist/Progress Notes Patient Eligibility Criteria Checklist & Daily Group note for Rec Tx Intervention  Date: 03.03.2015 Time: 2:45pm Location: [address]    AAA/T Program Assumption of Risk Form signed by Patient/ or Parent Legal Guardian yes  Patient is free of allergies or sever asthma yes  Patient reports no fear of animals yes  Patient reports no history of cruelty to animals yes   Patient understands his/her participation is voluntary yes  Patient washes hands before animal contact yes  Patient washes hands after animal contact yes  Behavioral Response: Engaged, Attentive, Appropriate   Education: Hand Washing, Appropriate Animal Interaction   Education Outcome: Acknowledges understanding   Clinical Observations/Feedback: Patient actively engaged with therapy dog, patient pet dog and smiled while doing so. Patient interacted with peer, LRT and dog team appropriately.   Marykay Lex Matty Deamer, LRT/CTRS  Jearl Klinefelter 04/10/2013 4:57 PM

## 2013-04-10 NOTE — Progress Notes (Signed)
D: Patient denies SI/HI and A/V hallucinations; patient reports that she did not sleep well due to the frequent room checks; patient reports" this is abuse, I can't get my email to do the things I need to address and not giving me what I need and I don't need to be here"; patient also reports " I dont need any psychotic meds"; nurse practitioner Withrow made aware of all the statements and all concerns made by the patient; patient reports that she think she should try pristiq and nurse practitioner Withrow made aware   A: Monitored q 15 minutes; patient encouraged to attend groups; patient educated about medications; patient given medications per physician orders; patient encouraged to express feelings and/or concerns  R: Patient is grandiose and demanding; patient constantly with different commands; patient is irritable and really wants to go home; patient has had several request for the social worker Leeroy Bock and she was made aware and she has spoken to the patient about what she needed; patient is very suspicious of the medications given and why we are asking for certain information;  patient's interaction with staff and peers is intrusive at times;patient is taking medications as prescribed and tolerating medications; patient is attending most groups

## 2013-04-10 NOTE — Progress Notes (Signed)
The focus of this group is to educate the patient on the purpose and policies of crisis stabilization and provide a format to answer questions about their admission.  The group details unit policies and expectations of patients while admitted. Patient attended the group but came in late.

## 2013-04-10 NOTE — [facility] Group Notes (Signed)
Adult Psychoeducational Group Note  Date:  04/10/2013 Time:  11:51 PM  Group Topic/Focus:  Wrap-Up Group:   The focus of this group is to help patients review their daily goal of treatment and discuss progress on daily workbooks.  Participation Level:  Active  Participation Quality:  Intrusive  Affect:  Defensive  Cognitive:  Alert  Insight: None  Engagement in Group:  Off Topic  Modes of Intervention:  Discussion  Additional Comments:  Pt. Stated that her goal was to act out discharge plans to get her legal matters in order. Pt. Also accused another pt of laughing at her. Staff explained that the other pt could have been laughing at something else and to ignore the laughing. Pt got up and left group.   Delia Chimes 04/10/2013, 11:51 PM

## 2013-04-10 NOTE — Progress Notes (Signed)
Pt reports she wants to be discharged because this is not a therapeutic environment for her.  She says she is not being given all of her meds.  She feels the other patients are laughing and making fun of her.  Pt tends to be intrusive with peers and many are reporting that it makes them uncomfortable.  Pt says she feels the other pts are young and immature, and are not on the same level of wellness as she is and that is why she wants to be discharged.  She says being here is not beneficial for her.  She denies SI/HI/AV.  She says she has a plan and will make her appts for herself.  She blames her mother for being here in the first place.  Staff listened to pt's concerns.  Support and encouragement offered.  Safety maintained with q15 minute checks.

## 2013-04-10 NOTE — Clinical Social Work Note (Addendum)
CSW met with pt individually at this time.  Pt continues to have flight of ideas and appears to be thought blocking.  Pt continues to be focused on her court situation and appeals but doesn't have any further information for CSW to assist pt with it.  Pt did find the fax number for the [employer] [place], although it is unclear why pt wants a letter faxed to them.  CSW will fax the letter today, per pt's request and consent.  Pt believes she is discharging today.  Pt refused follow up, stating that she wanted to start fresh and doesn't want the new outpatient provider to judge her off of our notes.  Pt states that she will schedule her own follow up at [facility] for medication management.  Pt also refused CSW to contact family at this time, but states she will think about it.     [place], LCSW 04/10/2013  10:44 AM   

## 2013-04-11 MED ORDER — NICOTINE 21 MG/24HR TD PT24
21.0000 mg | MEDICATED_PATCH | Freq: Every day | TRANSDERMAL | Status: DC
  Administered 2013-04-12: 21 mg via TRANSDERMAL
  Filled 2013-04-11 (×4): qty 1

## 2013-04-11 NOTE — [facility] Suicide Risk Assessment (Signed)
[facility] INPATIENT:  Family/Significant Other Suicide Prevention Education  Suicide Prevention Education:  Education Completed; Anne Black - daughter [phone]),  (name of family member/significant other) has been identified by the patient as the family member/significant other with whom the patient will be residing, and identified as the person(s) who will aid the patient in the event of a mental health crisis (suicidal ideations/suicide attempt).  With written consent from the patient, the family member/significant other has been provided the following suicide prevention education, prior to the and/or following the discharge of the patient.  The suicide prevention education provided includes the following:  Suicide risk factors  Suicide prevention and interventions  National Suicide Hotline telephone number  [facility] assessment telephone number  [facility] Assistance 911  [facility] and/or Residential Mobile Crisis Unit telephone number  Request made of family/significant other to:  Remove weapons (e.g., guns, rifles, knives), all items previously/currently identified as safety concern.    Remove drugs/medications (over-the-counter, prescriptions, illicit drugs), all items previously/currently identified as a safety concern.  The family member/significant other verbalizes understanding of the suicide prevention education information provided.  The family member/significant other agrees to remove the items of safety concern listed above. Daughter states that she visited pt on Monday.  Daughter states that during this visit she observed pt to be more put together and calmer from where she was on admission.  Daughter states that she believes pt is making progress here.  Daughter states that pt was still somewhat manic on Monday though, focusing on discharging and "all over the place".  Daughter states that prior to admission pt was arrested for something  (doesn't know what for) and called her grandmother (pt's mother) saying she couldn't breathe and didn't want to live anymore.  Daughter states that her only concern is pt doesn't take her meds at home and doesn't follow through with outpatient services.  Daughter states that she believes pt is at her baseline, as pt is always "all over the place" and manic, but seems more stable now than usual.  Daughter expressed concern for pt long term and what the family could do to help her.  CSW discussed outpatient commitment and possibly needing guardianship eventually.      Anne Black, Anne Black 04/11/2013, 10:59 AM

## 2013-04-11 NOTE — Tx Team (Signed)
Interdisciplinary Treatment Plan Update (Adult)  Date: 04/11/2013  Time Reviewed:  9:45 AM  Progress in Treatment: Attending groups: Yes Participating in groups:  Yes Taking medication as prescribed:  Yes Tolerating medication:  Yes Family/Significant othe contact made: CSW making attempts Patient understands diagnosis:  Yes Discussing patient identified problems/goals with staff:  Yes Medical problems stabilized or resolved:  Yes Denies suicidal/homicidal ideation: Yes Issues/concerns per patient self-inventory:  Yes Other:  New problem(s) identified: N/A  Discharge Plan or Barriers: Pt refused follow up and states that she will arrange it on her own, as she wants to start as a new patient with no records given to the new provider.  Pt has been intrusive on the unit, refusing meds and follow up.  Pt did agree for CSW to contact family for collateral but CSW has not heard back from anyone yet.    Reason for Continuation of Hospitalization: Anxiety Depression Medication Stabilization  Comments: N/A  Estimated length of stay: 1-2 days  For review of initial/current patient goals, please see plan of care.  Attendees: Patient:     Family:     Physician:  Dr. Javier Glazier 04/11/2013 9:59 AM   Nursing:   Marzetta Board, RN 04/11/2013 9:59 AM   Clinical Social Worker:  Reyes Ivan, LCSW 04/11/2013 9:59 AM   Other: Claudette Head, PA 04/11/2013 9:59 AM   Other:  Sherrye Payor, care coordination 04/11/2013 9:59 AM   Other:  Juline Patch, LCSW 04/11/2013 9:59 AM   Other:  Harold Barban, RN 04/11/2013 10:00 AM   Other: Leighton Parody, RN 04/11/2013 10:00 AM   Other: Onnie Boer, case manager 04/11/2013 10:03 AM   Other:    Other:    Other:      Scribe for Treatment Team:   Carmina Miller, 04/11/2013 , 9:59 AM

## 2013-04-11 NOTE — [facility] Group Notes (Signed)
[facility] LCSW Group Therapy  04/11/2013  1:15 PM   Type of Therapy:  Group Therapy  Participation Level:  Active  Participation Quality:  Attentive, Sharing and Supportive  Affect:  Calm  Cognitive:  Alert and Oriented  Insight:  Developing/Improving and Engaged  Engagement in Therapy:  Developing/Improving and Engaged  Modes of Intervention:  Clarification, Confrontation, Discussion, Education, Exploration, Limit-setting, Orientation, Problem-solving, Rapport Building, Dance movement psychotherapisteality Testing, Socialization and Support  Summary of Progress/Problems: The topic for group today was emotional regulation.  This group focused on both positive and negative emotion identification and allowed group members to process ways to identify feelings, regulate negative emotions, and find healthy ways to manage internal/external emotions. Group members were asked to reflect on a time when their reaction to an emotion led to a negative outcome and explored how alternative responses using emotion regulation would have benefited them. Group members were also asked to discuss a time when emotion regulation was utilized when a negative emotion was experienced.  Pt shared that she is learning to accept her emotions.  Pt than got off topic and had to be redirected, as pt asks "well what was the topic" or "what was your question?"  Pt discussed not understanding why she is here besides her mom committing her.  Pt states that she couldn't breathe and made a comment that was taken as suicidal.  Pt was than confronted by peers, as peers felt pt was rambling on and repeating herself.  Pt took offense to this but handled it appropriately in the group.  After group pt states that she has boundaries and doesn't let what others say affect her.  Pt appears calmer but continues to not understand her mental health and reason for being here.    Anne IvanChelsea Horton, LCSW 04/11/2013 3:15 PM

## 2013-04-11 NOTE — Progress Notes (Signed)
Patient ID: DENELL BERRYHILL, female   DOB: 12/29/1960, 53 y.o.   MRN: [id number]  D: Pt. Denies SI/HI and A/V Hallucinations. Patient does report pain due to MS and requests PRN Tylenol. Patient reports decreased pain (2/10) after PRN Tylenol was administered. MD was notified of patient's behavior on the unit. Another staff member spoke with Clinical research associate about patient disrupting the milieu and aggravating other patients. Writer has had to redirect patient several times.  A: Support and encouragement provided to the patient to talk with Clinical research associate. 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 Patient requested to speak with assistant director for the second time today or someone that can be in charge of discharge. Patient has been informed that she is not going to be discharged today. Chiropodist was called but contact could not be made. However, MD reported that he was going to speak with patient today.   R: Patient is intrusive and unwilling to cooperate. Patient is seen in the milieu at times but causes disruption. Patient is still in a room by herself due to agitation.  Q15 minute checks are maintained for safety.

## 2013-04-11 NOTE — Progress Notes (Signed)
Pt observed in bed with eyes closed, but not asleep.  Pt states she is frustrated that she was not discharged today, and does not understand why she is being kept here.   Pt has been kept informed of her status and she is refusing to allow follow up treatment to be scheduled.  She continues to state that she is not a psychiatric pt, and does not need that kind of follow up.  Pt's chart shows she has a long hx of psych treatment.  Multiple staff have spoken to the pt throughout the day.  At the present, pt states she is just sleepy, and wants to just go to bed.  She denies SI/HI/AV at this time.  She voices no other needs to this Clinical research associatewriter.  Support and encouragement offered.  Safety maintained with q15 minute checks.

## 2013-04-11 NOTE — [facility] Group Notes (Signed)
[facility] LCSW Aftercare Discharge Planning Group Note   04/11/2013 8:45 AM  Participation Quality:  Alert, Appropriate and Oriented  Mood/Affect:  Manic, Intrusive, Disorganized   Depression Rating:  2  Anxiety Rating:  2  Thoughts of Suicide:  Pt denies SI/HI  Will you contract for safety?   Yes  Current AVH:  Pt denies  Plan for Discharge/Comments:  Pt attended discharge planning group and actively participated in group.  CSW provided pt with today's workbook.  Pt states that she is still hoping to d/c soon.  Pt will return home in [place] and refused follow up, stating that she would arrange it on her own with [facility].  Pt states that she wants to start as a new patient there with no records from other places going there.  Pt continues to ask for help with things that are out of the scope of what we can do for her, such as family therapy with her 90 year old daughters that she doesn't have custody of.  Pt continues to be intrusive and disorganizes, as evidenced by multiple pieces of paper and notebooks with notes everywhere.  CSW will continue to attempt reaching family for collateral.  No further needs voiced by pt at this time.    Transportation Means: Pt reports access to transportation - friend or family will pick pt up  Supports: No supports mentioned at this time  Reyes Ivan, LCSW 04/11/2013 9:32 AM

## 2013-04-11 NOTE — Progress Notes (Signed)
Adult Psychoeducational Group Note  Date:  04/11/2013 Time:  10:59 AM  Group Topic/Focus:  Personal Choices and Values:   The focus of this group is to help patients assess and explore the importance of values in their lives, how their values affect their decisions, how they express their values and what opposes their expression.  Participation Level:  Active  Participation Quality:  Resistant  Affect:  Angry, Defensive and Resistant  Cognitive:  Alert and Oriented  Insight: Good  Engagement in Group:  Defensive and Resistant  Modes of Intervention:  Clarification, Confrontation, Discussion, Education, Problem-solving and Support  Additional Comments:  Pt verbalized being angry at her mom for committing her to the hospital and not allowing her to see her children. Pt left group when another patient tried to share with her. This pt. Stated, "I have to set some boundaries and cannot listen to you". Pt walked out of group. Reynolds Bowl 04/11/2013, 10:59 AM

## 2013-04-11 NOTE — Progress Notes (Signed)
Patient ID: Anne Black, female   DOB: 03-19-1960, 53 y.o.   MRN: [id number] Patient ID: AJAYLA IGLESIAS, female   DOB: 03-18-1960, 53 y.o.   MRN: [id number] [facility] MD Progress Note  04/11/2013 3:18 PM EULA JASTER  MRN:  [id number] Subjective:   Patient was admitted with the diagnoses of bipolar 1 disorder most recent episode is mania and she also has a history of multiple sclerosis and noncompliant with medication management. Reportedly patient was drinking alcohol and making multiple phone calls to her mother reporting suicide threats which lead to involuntary commitment petition. Patient has denies her mother's report and also has a poor judgment and insight into her current mental health condition. Patient was known for psychiatric treatment over several years. Patient has been preoccupied with aching stimulant medication for multiple sclerosis which she has been noncompliant for several months to years. She has been dealing with a lot of stressors  especially regarding custody battle with her twins (14) and being arrested for DUI.  She is also attempting to find a lawyer to help with her custody case because previous one did not fight he was properly.   During my evaluation today patient has been manic with poor insight, judgment and refused this morning medication  because she thinks it is a antipsychotic medication but not a mood stabilizer. Patient continued to have pressured speech,  argumentative, and tangential and circumstantial in her thought process. It is difficult to make her understand her current mental status because she's all over the place is and talking about things not relevant in getting into the business of other people on the unit. Patient started talking about she has a good insight, has a spiritually oriented, people in her peer group and does not like her because she has been interrupting and intrusive and does not stop talking. Patient stated her mother and her daughter are her  worst enemy and is at appropriate if she takes medication from a psychiatric hospital which may interfere if she wants to go back to work force in the future. Patient has very limited insight into her both medical and mental health condition and the prognosis. Patient will benefit from outpatient commitment to psychiatric services and she was ready to be discharged.  Patient has been repeatedly insisting she should be discharged home without appropriate disposition plan or support system in place. 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 Patient reported she saw Dr. Evelene Croon in the past and leg seeing psychiatrist in at [facility] and currently has no psychiatrist appointments. Patient has been in denial of mental illness and denial about her situation, repeating "I am not a psych patient and I don't need to be here".  patient will be treated on by mouth date of discharge incidental focusing on her treatment while in the hospital the staff members reported patient has been disorganized and constantly intrusive and interruptive to the therapeutic community. He is in the patient made suicidal threats while at home she has been endorsing safety contract while in the hospital. Her increased goal-directed activity thinking about  "the lawyers will sort this out because they are just wanting to keep my children". Patient  was very argumentative when discussing medication interactions and concerns about taking neurostimulatory medications alongside certain mood stabilizers and how they may interfere with circadian rhythms as well as anxiety/depression symptoms.   Diagnosis:  DSM5: Depressive Disorders:  Major Depressive Disorder - Severe (296.23) Total Time spent with patient: Greater than 25 minutes  Axis I: Bipolar, mixed and Generalized Anxiety Disorder Axis II: Cluster  B Traits Axis III:  Past Medical History  Diagnosis Date  . Multiple sclerosis   . Arthritis   . Anxiety   . Bipolar disorder   . ADHD (attention deficit hyperactivity disorder)    Axis IV: other psychosocial or environmental problems and problems related to social environment Axis V: 41-50 serious symptoms  ADL's:  Intact  Sleep: Good  Appetite:  Good  Suicidal Ideation:  Denies Homicidal Ideation:  Denies AEB (as evidenced by):  Psychiatric Specialty Exam: Physical Exam   ROS  Blood pressure 114/79, pulse 87, temperature 97.7 F (36.5 C), temperature source Oral, resp. rate 18, height 5\' 2"  (1.575 m), weight 63.504 kg (140 lb), SpO2 98.00%.Body mass index is 25.6 kg/(m^2).  General Appearance: Casual  Eye Contact::  Good  Speech:  Clear and Coherent  Volume:  Normal  Mood:  Anxious and Depressed  Affect:  Appropriate  Thought Process:  Coherent  Orientation:  Full (Time, Place, and Person)  Thought Content:  WDL  Suicidal Thoughts:  No  Homicidal Thoughts:  No  Memory:  Immediate;   Good Recent;   Good Remote;   Good  Judgement:  Good  Insight:  Good  Psychomotor Activity:  Normal  Concentration:  Good  Recall:  Good  Fund of Knowledge:Good  Language: Good  Akathisia:  NA  Handed:  Right  AIMS (if indicated):     Assets:  Communication Skills Desire for Improvement Resilience  Sleep:  Number of Hours: 6.25   Musculoskeletal: Strength & Muscle Tone: within normal limits Gait & Station: normal Patient leans: N/A  Current Medications: Current Facility-Administered Medications  Medication Dose Route Frequency Provider Last Rate Last Dose  . acetaminophen (TYLENOL) tablet 650 mg  650 mg Oral Q4H PRN Nanine MeansJamison Lord, NP   650 mg at 04/11/13 1057  . albuterol (PROVENTIL HFA;VENTOLIN HFA) 108 (90 BASE) MCG/ACT inhaler 2 puff  2 puff Inhalation Q6H PRN Court Joyharles E Kober, PA-C   2 puff at 04/11/13 [phone]  . alum & mag hydroxide-simeth (MAALOX/MYLANTA) 200-200-20  MG/5ML suspension 30 mL  30 mL Oral Q4H PRN Court Joyharles E Kober, PA-C      . amantadine (SYMMETREL) capsule 100 mg  100 mg Oral BID WC Beau FannyJohn C Withrow, FNP   100 mg at 04/11/13 1205  . azithromycin (ZITHROMAX) tablet 250 mg  250 mg Oral Daily Nanine MeansJamison Lord, NP   250 mg at 04/11/13 0827  . fluticasone (FLONASE) 50 MCG/ACT nasal spray 1 spray  1 spray Each Nare BID Beau FannyJohn C Withrow, FNP   1 spray at 04/11/13 [id number]  . guaiFENesin (MUCINEX) 12 hr tablet 600 mg  600 mg Oral BID Nanine MeansJamison Lord, NP   600 mg at 04/11/13 0826  . hydrOXYzine (ATARAX/VISTARIL) tablet 50 mg  50 mg Oral Q6H PRN Nehemiah SettleJanardhaha R Sian Joles, MD   50 mg at 04/10/13 2131  . magnesium hydroxide (MILK OF MAGNESIA) suspension 30 mL  30 mL Oral Daily PRN Court Joyharles E Kober, PA-C      . meloxicam [facility](MOBIC) tablet 15 mg  15 mg Oral Daily Court Joyharles E Kober, PA-C   15 mg at 04/11/13 0827  . nicotine polacrilex (NICORETTE) gum 2 mg  2 mg Oral PRN Nehemiah SettleJanardhaha R Barnie Sopko, MD   2 mg at 04/11/13 1307  . risperiDONE (RISPERDAL) tablet 1.5 mg  1.5 mg Oral BID Nehemiah Settle, MD   1.5 mg at 04/10/13 1814  . traZODone (DESYREL) tablet 50 mg  50 mg Oral QHS PRN,MR X 1 Court Joy, PA-C   50 mg at 04/09/13 2349  . vitamin B-12 (CYANOCOBALAMIN) tablet 1,000 mcg  1,000 mcg Oral Daily Beau Fanny, FNP   1,000 mcg at 04/11/13 3817    Lab Results: No results found for this or any previous visit (from the past 48 hour(s)).  Physical Findings: AIMS: Facial and Oral Movements Muscles of Facial Expression: None, normal Lips and Perioral Area: None, normal Jaw: None, normal Tongue: None, normal,Extremity Movements Upper (arms, wrists, hands, fingers): None, normal Lower (legs, knees, ankles, toes): None, normal, Trunk Movements Neck, shoulders, hips: None, normal, Overall Severity Severity of abnormal movements (highest score from questions above): None, normal Incapacitation due to abnormal movements: None, normal Patient's awareness of abnormal  movements (rate only patient's report): No Awareness, Dental Status Current problems with teeth and/or dentures?: No Does patient usually wear dentures?: No  CIWA:  CIWA-Ar Total: 3 COWS:  COWS Total Score: 0  Treatment Plan Summary: Daily contact with patient to assess and evaluate symptoms and progress in treatment Medication management  Plan: Review of chart, vital signs, medications, and notes.  1-Individual and group therapy  2-Medication management for depression and anxiety: Medications reviewed with the patient and she stated no untoward effects. Continue taper of Provigil as planned because of manic symptoms (to titrate down 200, 100, 0).  Continue Amantadine 100mg  bid for MS symptoms (better choice due less drug to drug interactions) Continue Risperidone 1.5 mg PO BID 4 mood swings and manic symptoms Continue Hydroxyzine 50 mg Q6H PRN for anxiety Patient may benefit from adding Depakote if not responds for risperidone  Discontinue Robitussin, tessalon, Wellbutrin and Librium - either not required or not useful Continue Vitamin B12 daily for energy 3-Coping skills for depression, anxiety  4-Continue crisis stabilization and management  5-Address health issues--monitoring vital signs, stable  6-Treatment plan in progress to prevent relapse of depression and anxiety 7. Disposition plans in progress in case he be discussed in treatment team meeting tomorrow morning   Medical Decision Making Problem Points:  Established problem, stable/improving (1), Review of last therapy session (1) and Review of psycho-social stressors (1) Data Points:  Review or order clinical lab tests (1) Review or order medicine tests (1) Review of medication regiment & side effects (2) Review of new medications or change in dosage (2)  I certify that inpatient services furnished can reasonably be expected to improve the patient's condition.    Albion Weatherholtz,JANARDHAHA R. 04/11/2013 3:18 PM

## 2013-04-11 NOTE — Progress Notes (Signed)
Patient ID: RAYVN STARK, female   DOB: 17-Feb-1960, 53 y.o.   MRN: [id number]  Patient reports that she has no choice when taking her medication. Writer re-educated patient about her right to refuse medication. Patient Nurse, children's of being the reason why she can't discharge. Writer reiterated that patient was encouraged to take her medication but patient has a right to refuse medication. Writer offered patient support but patient refused.

## 2013-04-11 NOTE — Clinical Social Work Note (Signed)
CSW spoke with pt's mother, Teena Irani at this time [phone]).  Mother states that she's been dealing with pt's behaviors for 30 years on and off, but reports pt's behaviors and mental health has worsened over the last 3 years.  Mother states that pt has had 6 IVC's as well as 4 voluntarily admissions.  Mother expressed concern that nobody seems to be able to help pt, stating that pt has been discharged from every provider and atognizies providers so no one wants to help her outpatient.  Mother reports pt has had multiple arrests and DUI's.  Mother states that she doesn't know what else to do to help pt, as she is scared of pt due to her threatening mother.  Mother reports being a psychiatric nurse in the past and understands how the system works, but wishes there is more that could be done, such as pt being hospitalized long term.  Discussed outpatient commitment, guardianship and APS report as a means to get pt support in the future if mother feels pt is not safe or competent to care for herself.  Mother states that she plans to hire an attorney and call APS, when pt returns home.  Mother was thankful for this conversation.    Reyes Ivan, LCSW 04/11/2013  12:48 PM

## 2013-04-11 NOTE — Clinical Social Work Note (Signed)
CSW spoke with pt about the conversations CSW had with her family.  CSW explained that everyone continues to see pt as manic.  Pt states that this was unprofessional for CSW to say and doesn't believe she is manic or has a mental disorder.  Pt states that her primary diagnosis is multiple sclerosis and only wants this treated.  Pt continues to refuse signing consent for follow up for CSW, stating that she doesn't want the new doctor to have a "pre conceived notion" of pt before she arrives.  Pt states that she is willing to sign a consent though if it allows her to discharge today.  Pt states that she is worried about a court date she has tomorrow, saying it is for her attorney withdrawing from her case.  Pt states the attorney is representing her for her custody case and pending DUI.    CSW discussed this interaction with Dr. Elsie SaasJonnalagadda as well as the collateral info received.  Discussed outpatient commitment for pt being appropriate.  CSW will start paperwork for Dr. Shela CommonsJ to complete outpatient commitment.   Anne IvanChelsea Horton, LCSW 04/11/2013  3:24 PM

## 2013-04-12 DIAGNOSIS — F332 Major depressive disorder, recurrent severe without psychotic features: Secondary | ICD-10-CM

## 2013-04-12 MED ORDER — AMANTADINE HCL 100 MG PO CAPS: 100.0000 mg | ORAL_CAPSULE | Freq: Two times a day (BID) | ORAL | Status: DC

## 2013-04-12 MED ORDER — TRAZODONE HCL 50 MG PO TABS: 50.0000 mg | ORAL_TABLET | Freq: Every evening | ORAL | Status: DC | PRN

## 2013-04-12 MED ORDER — GUAIFENESIN ER 600 MG PO TB12: 600.0000 mg | ORAL_TABLET | Freq: Two times a day (BID) | ORAL | Status: DC

## 2013-04-12 MED ORDER — CLONAZEPAM 0.5 MG PO TABS
0.5000 mg | ORAL_TABLET | Freq: Two times a day (BID) | ORAL | Status: DC
  Administered 2013-04-12: 0.5 mg via ORAL
  Filled 2013-04-12: qty 1

## 2013-04-12 MED ORDER — CLONAZEPAM 0.5 MG PO TABS: 0.5000 mg | ORAL_TABLET | Freq: Two times a day (BID) | ORAL | Status: DC

## 2013-04-12 MED ORDER — RISPERIDONE 0.5 MG PO TABS: 1.5000 mg | ORAL_TABLET | Freq: Two times a day (BID) | ORAL | Status: DC

## 2013-04-12 NOTE — [facility] Suicide Risk Assessment (Signed)
   Demographic Factors:  Adolescent or young adult, Caucasian, Living alone and Unemployed  Total Time spent with patient: 30 minutes  Psychiatric Specialty Exam: Physical Exam  Review of Systems  All other systems reviewed and are negative.    Blood pressure 145/67, pulse 103, temperature 97.4 F (36.3 C), temperature source Oral, resp. rate 18, height 5\' 2"  (1.575 m), weight 63.504 kg (140 lb), SpO2 98.00%.Body mass index is 25.6 kg/(m^2).  General Appearance: Fairly Groomed  Patent attorney::  Good  Speech:  Clear and Coherent  Volume:  Normal  Mood:  Angry, Anxious and Irritable  Affect:  Appropriate and Congruent  Thought Process:  Goal Directed and Intact  Orientation:  Full (Time, Place, and Person)  Thought Content:  WDL  Suicidal Thoughts:  No  Homicidal Thoughts:  No  Memory:  Immediate;   Fair  Judgement:  Intact  Insight:  Fair  Psychomotor Activity:  Restlessness  Concentration:  Fair  Recall:  [employer] of Knowledge:Fair  Language: Fair  Akathisia:  NA  Handed:  Right  AIMS (if indicated):     Assets:  Communication Skills Desire for Improvement Financial Resources/Insurance Housing Intimacy Physical Health Resilience Social Support Transportation  Sleep:  Number of Hours: 6.5    Musculoskeletal: Strength & Muscle Tone: within normal limits Gait & Station: normal Patient leans: Right   Mental Status Per Nursing Assessment::   On Admission:     Current Mental Status by Physician: NA  Loss Factors: Financial problems/change in socioeconomic status  Historical Factors: Family history of mental illness or substance abuse  Risk Reduction Factors:   Sense of responsibility to family, Religious beliefs about death, Living with another person, especially a relative, Positive social support, Positive therapeutic relationship and Positive coping skills or problem solving skills  Continued Clinical Symptoms:  Bipolar Disorder:   Mixed  State Alcohol/Substance Abuse/Dependencies More than one psychiatric diagnosis Unstable or Poor Therapeutic Relationship Previous Psychiatric Diagnoses and Treatments Medical Diagnoses and Treatments/Surgeries  Cognitive Features That Contribute To Risk:  Polarized thinking    Suicide Risk:  Minimal: No identifiable suicidal ideation.  Patients presenting with no risk factors but with morbid ruminations; may be classified as minimal risk based on the severity of the depressive symptoms  Discharge Diagnoses:   AXIS I:  Alcohol Abuse and Bipolar, Manic AXIS II:  Deferred AXIS III:   Past Medical History  Diagnosis Date  . Multiple sclerosis   . Arthritis   . Anxiety   . Bipolar disorder   . ADHD (attention deficit hyperactivity disorder)    AXIS IV:  other psychosocial or environmental problems, problems related to social environment and problems with primary support group AXIS V:  51-60 moderate symptoms  Plan Of Care/Follow-up recommendations:  Activity:  as tolerated Diet:  regular  Is patient on multiple antipsychotic therapies at discharge:  No   Has Patient had three or more failed trials of antipsychotic monotherapy by history:  No  Recommended Plan for Multiple Antipsychotic Therapies: NA    Mariyam Remington,JANARDHAHA R. 04/12/2013, 9:09 AM

## 2013-04-12 NOTE — Progress Notes (Signed)
Discharge Note: Discharge instructions/prescriptions/medication samples given to patient. Patient verbalized understanding of discharge instructions and prescriptions. Returned belongings to patient. Denies SI/HI/AVH. Patient d/c without incident to the lobby and transported home by ex-husband.

## 2013-04-12 NOTE — Progress Notes (Signed)
Adult Psychoeducational Group Note  Date:  04/12/2013 Time:  9:00 AM  Group Topic/Focus:  Morning Wellness Group  Participation Level:  Did Not Attend   Additional Comments: Pt. was being seen by the MD during this group.   Melanee SpryByrd, Edie Darley E 04/12/2013, 6:51 PM

## 2013-04-12 NOTE — Progress Notes (Signed)
[facility] Adult Case Management Discharge Plan :  Will you be returning to the same living situation after discharge: Yes,  returning home At discharge, do you have transportation home?:Yes,  family or friend will pick pt up Do you have the ability to pay for your medications:Yes,  provided pt with prescription and pt verbalizes ability to afford meds  Release of information consent forms completed and in the chart;  Patient's signature needed at discharge.  Patient to Follow up at: Follow-up Information   Follow up with [facility] On 05/04/2013. (Appointment scheduled at 5:00 pm on this date with Dr. Jannifer Franklin for medication management.  )    Contact information:   [address] [place], [place] [address] Phone: [phone] Fax: [phone]      Patient denies SI/HI:   Yes,  denies SI/HI    Safety Planning and Suicide Prevention discussed:  Yes,  discussed with pt and pt's daughter.  See suicide prevention education note.   During this hospitalization, pt was non compliant with treatment recommendations.  Pt refused medication and initially refused follow up.  After receiving collateral information from family it was determined pt would benefit from an outpatient commitment.  Pt has no insight to her mental health and believes there is nothing wrong with her besides multiple sclerosis.    Anne Black 04/12/2013, 10:20 AM

## 2013-04-12 NOTE — Discharge Summary (Signed)
Physician Discharge Summary Note  Patient:  Anne Black is an 53 y.o., female MRN:  [id number] DOB:  03-27-1960 Patient phone:  [phone] (home)  Patient address:   [phone] [place] [address],  Total Time spent with patient: Greater than 30 minutes  Date of Admission:  04/07/2013 Date of Discharge: 04/12/2013  Reason for Admission:  Bipolar, Manic, with SI  Discharge Diagnoses: Principal Problem:   Bipolar I disorder, most recent episode (or current) manic   Psychiatric Specialty Exam: Physical Exam  ROS  Blood pressure 145/67, pulse 103, temperature 97.4 F (36.3 C), temperature source Oral, resp. rate 18, height 5\' 2"  (1.575 m), weight 63.504 kg (140 lb), SpO2 98.00%.Body mass index is 25.6 kg/(m^2).  General Appearance: Disheveled  Eye Contact::  Good  Speech:  Clear and Coherent  Volume:  Normal  Mood:  Anxious  Affect:  Inappropriate  Thought Process:  Coherent  Orientation:  Full (Time, Place, and Person)  Thought Content:  WDL  Suicidal Thoughts:  No  Homicidal Thoughts:  No  Memory:  Immediate;   Good Recent;   Good Remote;   Good  Judgement:  Good  Insight:  Good  Psychomotor Activity:  Increased  Concentration:  Good  Recall:  Good  Fund of Knowledge:Good  Language: NA  Akathisia:  NA  Handed:  Right  AIMS (if indicated):     Assets:  Communication Skills Desire for Improvement Resilience  Sleep:  Number of Hours: 6.5     Musculoskeletal: Strength & Muscle Tone: within normal limits Gait & Station: normal Patient leans: N/A  DSM5:  Depressive Disorders:  Major Depressive Disorder - Severe (296.23)  Axis Diagnosis:   AXIS I:  Major Depression, Recurrent severe AXIS II:  Deferred AXIS III:   Past Medical History  Diagnosis Date  . Multiple sclerosis   . Arthritis   . Anxiety   . Bipolar disorder   . ADHD (attention deficit hyperactivity disorder)    AXIS IV:  other psychosocial or environmental problems and problems  related to social environment AXIS V:  61-70 mild symptoms  Level of Care:  OP  Hospital Course:  53 y.o. female presenting to [facility] ED [facility] under IVC. Per IVC pt has been abusing alcohol and mother reports that she has been called numerous times. Mother reported that pt left multiple messages stating that she was going to kill herself. Pt reported that she does not recall making any of those statements to her mother. Pt reported multiple times during this assessment that she wanted to get her meds so that she can function better. Pt reported that she has been dealing with a lot of stressors in her life such as a custody battle with her twins and being arrested for DUI. Pt also reported that she is also attempting to find a lawyer to help with her custody case.    During Hospitalization: Medications managed, psychoeducation, group and individual therapy. Pt currently denies SI, HI, and Psychosis. At discharge, pt rates anxiety at 0/10 and depression at 0/10. Pt states that she does have a good supportive home environment and will followup with outpatient treatment. Affirms agreement with medication regimen and discharge plan. However, pt has a major history of noncompliance, but has agreed to keep her appointments this time. Family states that pt's current behavior is baseline or actually slightly better. Denies other physical and psychological concerns at time of discharge.   Consults:  None  Significant Diagnostic Studies:  None  Discharge Vitals:   Blood pressure 145/67, pulse 103, temperature 97.4 F (36.3 C), temperature source Oral, resp. rate 18, height 5\' 2"  (1.575 m), weight 63.504 kg (140 lb), SpO2 98.00%. Body mass index is 25.6 kg/(m^2). Lab Results:   No results found for this or any previous visit (from the past 72 hour(s)).  Physical Findings: AIMS: Facial and Oral Movements Muscles of Facial Expression: None, normal Lips and Perioral Area: None, normal Jaw: None,  normal Tongue: None, normal,Extremity Movements Upper (arms, wrists, hands, fingers): None, normal Lower (legs, knees, ankles, toes): None, normal, Trunk Movements Neck, shoulders, hips: None, normal, Overall Severity Severity of abnormal movements (highest score from questions above): None, normal Incapacitation due to abnormal movements: None, normal Patient's awareness of abnormal movements (rate only patient's report): No Awareness, Dental Status Current problems with teeth and/or dentures?: No Does patient usually wear dentures?: No  CIWA:  CIWA-Ar Total: 3 COWS:  COWS Total Score: 0  Psychiatric Specialty Exam: See Psychiatric Specialty Exam and Suicide Risk Assessment completed by Attending Physician prior to discharge.  Discharge destination:  Home  Is patient on multiple antipsychotic therapies at discharge:  No   Has Patient had three or more failed trials of antipsychotic monotherapy by history:  No  Recommended Plan for Multiple Antipsychotic Therapies: NA     Medication List    STOP taking these medications       buPROPion 150 MG 24 hr tablet  Commonly known as:  WELLBUTRIN XL     FLUoxetine 20 MG capsule  Commonly known as:  PROZAC     HYDROcodone-acetaminophen 5-325 MG per tablet  Commonly known as:  NORCO/VICODIN     hydrOXYzine 25 MG capsule  Commonly known as:  VISTARIL      TAKE these medications     Indication   albuterol 108 (90 BASE) MCG/ACT inhaler  Commonly known as:  PROVENTIL HFA;VENTOLIN HFA  Inhale 2 puffs into the lungs every 6 (six) hours as needed for wheezing.      amantadine 100 MG capsule  Commonly known as:  SYMMETREL  Take 1 capsule (100 mg total) by mouth 2 (two) times daily with breakfast and lunch.   Indication:  MS     clonazePAM 0.5 MG tablet  Commonly known as:  KLONOPIN  Take 1 tablet (0.5 mg total) by mouth 2 (two) times daily.   Indication:  anxiety     guaiFENesin 600 MG 12 hr tablet  Commonly known as:  MUCINEX   Take 1 tablet (600 mg total) by mouth 2 (two) times daily.   Indication:  Cough     ibuprofen 200 MG tablet  Commonly known as:  ADVIL,MOTRIN  Take 600-800 mg by mouth every 6 (six) hours as needed.      meloxicam 15 MG tablet  Commonly known as:  MOBIC  Take 1 tablet (15 mg total) by mouth daily.      risperiDONE 0.5 MG tablet  Commonly known as:  RISPERDAL  Take 3 tablets (1.5 mg total) by mouth 2 (two) times daily.   Indication:  Manic-Depression, Easily Angered or Annoyed     traZODone 50 MG tablet  Commonly known as:  DESYREL  Take 1 tablet (50 mg total) by mouth at bedtime as needed for sleep.   Indication:  Trouble Sleeping           Follow-up Information   Follow up with [facility] On 05/04/2013. (Appointment scheduled at 5:00 pm on this date with  Dr. Jannifer Franklin for medication management.  )    Contact information:   [address] [place], [place] [address] Phone: [phone] Fax: [phone]      Follow-up recommendations:  Activity:  As tolerated Diet:  Heart healthy with low sodium. Other:  Keep all follow-up appointments.  Comments:   Take all medications as prescribed. Keep all follow-up appointments as scheduled.  Do not consume alcohol or use illegal drugs while on prescription medications. Report any adverse effects from your medications to your primary care provider promptly.  In the event of recurrent symptoms or worsening symptoms, call 911, a crisis hotline, or go to the nearest emergency department for evaluation.   Total Discharge Time:  Greater than 30 minutes.  Signed: Beau Fanny, FNP-BC 04/12/2013, 10:21 AM  Patient was seen for psychiatric evaluation and suicide risk assessment, case discussed with treatment team and a physician extender. Disposition plans were made and reviewed the information documented and agree with the treatment plan.  Keighley Deckman,JANARDHAHA R. 04/12/2013 6:02 PM

## 2013-04-12 NOTE — Progress Notes (Signed)
Adult Psychoeducational Group Note  Date:  04/12/2013 Time:  11:23 AM  Group Topic/Focus:  Overcoming Stress:   The focus of this group is to define stress and help patients assess their triggers.  Participation Level:  Active  Participation Quality:  Appropriate and Sharing  Affect:  Appropriate  Cognitive:  Oriented  Insight: Good and Improving  Engagement in Group:  Engaged, Improving and Supportive  Modes of Intervention:  Discussion, Education, Socialization and Support  Additional Comments:  Pt identified a number of stressors in her life. In order to handle this stress she plans to start going to the gym, get people to help with housework,and  attend [employer]. She was appropriate in group and offered feedback to others.   Reynolds Bowl 04/12/2013, 11:23 AM

## 2013-04-17 NOTE — Clinical Social Work Note (Signed)
CSW spoke with Anne Black's mother Teena IraniMargie Hatley [phone]) at this time.  Mother is concerned with pt's behaviors, stating that pt never should've been discharged.  Mother is also concerned with why the outpatient commitment is not in place.  CSW explained that pt was stable to d/c when we did and pt d/c before the sheriff could serve pt, so it was dropped.  Mother states that pt is cussing her out on the phone, upset about not getting her ADHD medication.  Mother states that pt is not able to care for herself.  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  CSW explained that pt was not prescribed her ADHD medication here as it made her more manic.  Mother states that Albin FellingCarla is making comments such as she will go on the street to get her meds and can't live like this.  Mother is very frustrated with the system, stating that she is 53 yrs old and is tired of going through this to get Albin FellingCarla help.  CSW encouraged mother to IVC pt again if she is concerned.    Pt has also left CSW numerous voicemails for CSW today [phone]).  Pt rambles on about [facility] being [facility], felt like a [place] with her meds here.  Pt states that she is not able to function without her ADHD medication and not understanding why she can't be on the ADHD medication.  Pt is also wanting her hospitalization to count as DUI classes ordered by the court.  CSW attempted to return pt's phone calls but has been unable to reach pt live.  Voicemails left.    Reyes IvanChelsea Horton, LCSW 04/17/2013  3:44 PM

## 2013-04-17 NOTE — Progress Notes (Signed)
Patient Discharge Instructions:  After Visit Summary (AVS):   Faxed to:  04/17/13 Discharge Summary Note:   Faxed to:  04/17/13 Psychiatric Admission Assessment Note:   Faxed to:  04/17/13 Suicide Risk Assessment - Discharge Assessment:   Faxed to:  04/17/13 Faxed/Sent to the Next Level Care provider:  04/17/13 Faxed to [facility] @ [phone]  Jerelene ReddenSheena E Hillcrest, 04/17/2013, 4:26 PM

## 2013-04-23 NOTE — Clinical Social Work Note (Signed)
Outpatient commitment letter of recommendation completed (see chart, communication tab).  CSW informed by Cleopatra CedarNatashia Jones, secretary that pt has court tomorrow for outpatient commitment.    Reyes IvanChelsea Horton, LCSW 04/23/2013  1:08 PM

## 2013-06-05 ENCOUNTER — Telehealth: Payer: Self-pay | Admitting: Neurology

## 2013-06-05 NOTE — Telephone Encounter (Signed)
Patient called the office with questions about forms for starting Tecfidera and also requested that something be prescribed for spasms she is having.  She is requesting a call back.  By viewing previous notes, it appears the patient may be (or may be in the process of being) dismissed from our practice.  Please advise.  Thank you.

## 2013-06-05 NOTE — Telephone Encounter (Signed)
I called patient. I left messages. This patient has been discharged from our practice, we are no longer actively following this patient. The 30 day time period following discharge from practice Was up 06/02/2013. We have no professional relationship with this patient at this point.

## 2013-06-05 NOTE — Telephone Encounter (Signed)
Pt called to see if we recd the fax on her medication Tecfidera that Dr. Anne Hahn was going to try pt on.  Pt is having spasms under her left breast but has it under both and wants to know if there is something that can be called in to help with this.  Please call pt concerning this matter. Thanks

## 2013-06-20 ENCOUNTER — Encounter: Payer: Self-pay | Admitting: Neurology

## 2013-06-21 ENCOUNTER — Other Ambulatory Visit: Payer: Self-pay

## 2013-06-21 MED ORDER — DULOXETINE HCL 30 MG PO CPEP: 30.0000 mg | ORAL_CAPSULE | Freq: Every day | ORAL | Status: DC

## 2013-06-21 NOTE — Telephone Encounter (Signed)
Pharm reqs RF of duloxetine 30 mg, 1 QD. Dr L had written this for pt in the past for pain from MS but hasn't seen pt recently. Dr Dareen Piano, do you want to RX?

## 2013-08-03 ENCOUNTER — Other Ambulatory Visit: Payer: Self-pay | Admitting: Gastroenterology

## 2013-08-15 ENCOUNTER — Telehealth: Payer: Self-pay | Admitting: Gastroenterology

## 2013-08-16 NOTE — Telephone Encounter (Signed)
No answer and no voice mail.

## 2013-08-17 ENCOUNTER — Telehealth: Payer: Self-pay

## 2013-08-17 ENCOUNTER — Other Ambulatory Visit: Payer: Self-pay | Admitting: Gastroenterology

## 2013-08-17 NOTE — Telephone Encounter (Signed)
I am sorry this happened.  I saw her 1-2 years ago and not since.  I see that she was discharged from primary care.  Let's discuss your interaction with her next week.

## 2013-08-17 NOTE — Telephone Encounter (Signed)
No answer and no voice mail I will await further communication from the pt

## 2013-08-17 NOTE — Telephone Encounter (Signed)
Dr Christella Hartigan this pt was extremely rude to the front staff and to myself, she called and insisted that I send her medicine in for her H pylori, said she knows more about H pylori than Dr Christella Hartigan could ever know.  She also states she is on amoxicillin (and has a Phd in amoxicillin) that she prescribed her self.  She says that there is no FDA rule that states I can not send her medicine today the whole time screaming and cursing at me.  I advised her that I could not help her while she was screaming at me and that I would be glad to set her up an appt but she proceeded to curse at me at the top of her lungs and hung up.  Prior to my call she called the Dr line numerous times and hung up and then she spoke to Smarr, Amy and Toniann Fail and cursed at them as well.

## 2013-08-20 ENCOUNTER — Telehealth: Payer: Self-pay | Admitting: Gastroenterology

## 2013-08-20 NOTE — Telephone Encounter (Signed)
discharge letter written and sent to be mailed

## 2013-08-20 NOTE — Telephone Encounter (Signed)
Dismissal Letter sent by Certified Mail 08/21/2013

## 2013-08-20 NOTE — Telephone Encounter (Signed)
Discharge letter has been routed to Dr Christella HartiganJacobs per his request

## 2013-08-29 ENCOUNTER — Telehealth: Payer: Self-pay | Admitting: *Deleted

## 2013-08-29 NOTE — Telephone Encounter (Signed)
Per Dr. Juanda ChanceBrodie, patient called last night asking for medications for h. Pylori. Patient is having diarrhea and will need to be seen for possible C. Diff. Spoke with Clarnce FlockJoan Curtin and patient can be seen today at 10:00 AM with Willette ClusterPaula Guenther, NP. Left a message for patient to call me.

## 2013-08-31 NOTE — Telephone Encounter (Signed)
Patient did not return our call. 

## 2013-09-11 ENCOUNTER — Institutional Professional Consult (permissible substitution): Payer: Managed Care, Other (non HMO) | Admitting: Medical

## 2013-09-26 ENCOUNTER — Telehealth: Payer: Self-pay | Admitting: Medical

## 2013-09-26 ENCOUNTER — Ambulatory Visit (INDEPENDENT_AMBULATORY_CARE_PROVIDER_SITE_OTHER): Payer: Medicare Other | Admitting: Medical

## 2013-09-26 VITALS — BP 132/82 | HR 76 | Temp 97.8°F | Resp 16 | Wt 161.0 lb

## 2013-09-26 DIAGNOSIS — G35 Multiple sclerosis: Secondary | ICD-10-CM

## 2013-09-26 DIAGNOSIS — G35D Multiple sclerosis, unspecified: Secondary | ICD-10-CM

## 2013-09-26 DIAGNOSIS — F3289 Other specified depressive episodes: Secondary | ICD-10-CM

## 2013-09-26 DIAGNOSIS — M25559 Pain in unspecified hip: Secondary | ICD-10-CM

## 2013-09-26 DIAGNOSIS — I10 Essential (primary) hypertension: Secondary | ICD-10-CM

## 2013-09-26 DIAGNOSIS — F17200 Nicotine dependence, unspecified, uncomplicated: Secondary | ICD-10-CM

## 2013-09-26 DIAGNOSIS — K219 Gastro-esophageal reflux disease without esophagitis: Secondary | ICD-10-CM

## 2013-09-26 DIAGNOSIS — F32A Depression, unspecified: Secondary | ICD-10-CM

## 2013-09-26 DIAGNOSIS — F319 Bipolar disorder, unspecified: Secondary | ICD-10-CM

## 2013-09-26 DIAGNOSIS — F411 Generalized anxiety disorder: Secondary | ICD-10-CM

## 2013-09-26 DIAGNOSIS — F329 Major depressive disorder, single episode, unspecified: Secondary | ICD-10-CM

## 2013-09-26 DIAGNOSIS — M25551 Pain in right hip: Secondary | ICD-10-CM

## 2013-09-26 DIAGNOSIS — J45909 Unspecified asthma, uncomplicated: Secondary | ICD-10-CM

## 2013-09-26 MED ORDER — FLUTICASONE FUROATE 27.5 MCG/SPRAY NA SUSP: 2.0000 | Freq: Every day | NASAL | Status: DC

## 2013-09-26 MED ORDER — MONTELUKAST SODIUM 10 MG PO TABS: 10.0000 mg | ORAL_TABLET | Freq: Every day | ORAL | Status: DC

## 2013-09-26 MED ORDER — TRIAMTERENE-HCTZ 37.5-25 MG PO TABS: 1.0000 | ORAL_TABLET | Freq: Every day | ORAL | Status: DC

## 2013-09-26 MED ORDER — ALBUTEROL SULFATE HFA 108 (90 BASE) MCG/ACT IN AERS: 2.0000 | INHALATION_SPRAY | Freq: Four times a day (QID) | RESPIRATORY_TRACT | Status: DC | PRN

## 2013-09-26 MED ORDER — FLUTICASONE-SALMETEROL 100-50 MCG/DOSE IN AEPB: 1.0000 | INHALATION_SPRAY | Freq: Two times a day (BID) | RESPIRATORY_TRACT | Status: DC

## 2013-09-26 NOTE — Telephone Encounter (Signed)
Does she need to be referred to neurology for MS? Why does she need to be referred to a psychiatrist (for antidepressant meds)? Please let me know so I can refer for the correct reasons

## 2013-09-26 NOTE — Telephone Encounter (Signed)
We only got her to sign a release for [facility],Dr. Loreta Ave and [facility]. She also had medicare which she did not tell us when she got here.

## 2013-09-26 NOTE — Telephone Encounter (Signed)
Patient called to see how long it would be until she could get meds refilled. She states Vincenza HewsShane wants records first and wants to know how long that would take. Advised her that I am not certain of time that we have to wait until records come in.   Advised her that per Vincenza HewsShane , he will not prescribe any controlled substances per his notes

## 2013-09-26 NOTE — Progress Notes (Signed)
Subjective New patient today.  [facility], then went to [facility] for a while.   Urgent care advised her [facility] Based on her reports she has seen numerous specialists and numerous providers over the years including in recent months  Most recently been seeing [facility] on [address] but they told her to transfer to primary care  Neurology - in the past has [facility], sees Harlen LabsEmily Pharr, MD and Alric Setonoug Jeffries MD for multiple sclerosis.    Dewaine OatsBo Fried MD with [facility] [place] prior Gastroenterology - Dr. Loreta AveMann currently, and colonoscopy pending. Was seeing [facility] prior for primary care. Was [facility] recently, but has seen other psychiatrists.  Her initial concern is medication refills.  She primarily is asking to have her new Nuvigil, Vyvanse, and antidepressants refilled. She states that [facility] would not refill those, and Dr. Renne CriglerPharr neurologist at [facility] would not fill those medications either.  She asked if I can't refilled those could I at least refill Vistaril and antidepressants.    She [facility] and they did not see eye to eye, and she cannot go back their.  She would like to go back to Dr. Renne CriglerPharr at [facility] but Dr. Renne CriglerPharr will not refill her Nuvigil and Vyvanse, can't see her til 2016?  She did not like [name] due to having to talk to a computer screen and they would not refill the medication she requested.  they did however put her on BuSpar and antidepressants.  She also complains of right hip pain ongoing for years, uses hydrocodone once daily for this, has been using Mobic, but Dr. Loreta AveMann her gastroenteritis wants her off of the [facility].  Compliant with blood pressure medication without complaint  History of asthma compliant with Advair and albuterol without complaint  She states that  Dr. freed 9 practitioner [facility] a 1  She reports that Dr. Foy GuadalajaraFried, [facility] started her on Nuvigil and  Vyvanse.  She never really said why she didn't or coulnd't go back there.    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  Review of systems as in subjective  Objective Filed Vitals:   09/26/13 1232  BP: 132/82  Pulse: 76  Temp: 97.8 F (36.6 C)  Resp: 16    General appearance: alert, no distress, WD/WN, white female Heart: RRR, normal S1, S2, no murmurs Lungs: CTA bilaterally, no wheezes, rhonchi, or rales MSK: tender right hip and trochanteric bursa, relatively normal ROM, otherwise legs nontender and normal ROM Legs neurovascularly intact Ext: no edema Neuro: cn2-12 intact. Psych: stares without blinking for periods, unusual affect, answered questions appropriately ecpet wanders with thoughts at times and goes off in tangents at times.  Pleasant.   Assessment: Encounter Diagnoses  Name Primary?  . Bipolar disorder, unspecified Yes  . Generalized anxiety disorder   . Depression   . Hip pain, right   . Essential hypertension, benign   . Multiple sclerosis   . Unspecified asthma(493.90)   . Gastroesophageal reflux disease without esophagitis   . Smoker     Plan: I will refill her asthma and blood pressure medication.  I advised that given her history, her numerous medications, many of which are controlled substances, and given the brief records I have examined already, I advise I cannot refill her  controlled substances and would not take on responsibility for this condition.  I reviewed recent notes from [facility] neurology.  She was dismissed from their, she would not agree to their treatment plan, and they seemed to not have a good initial meeting. She apparently had been seen by [facility] neurology in years past with similar result.  The [facility] neurology notes suggest that she has been dismissed from several  providers including been dismissed from Dr. Renne Crigler from [facility] in 2014 although she refutes this.  We will try and see if we can get her hooked up with a neurology office and psychiatry office.  I advise that can work with her with her blood pressure asthma and hip pain, she has followup with Dr. Loreta Ave gastroenterology are scheduled.  She declines hip x-ray today.  Followup pending call back

## 2013-09-26 NOTE — Telephone Encounter (Signed)
We need to try to get her in with a neurologist and psychiatrist.    She cannot see [facility] as she was dismissed from there.     We can try to either get her back in with Dr. Renne Crigler at [facility] [phone]), neurology in [facility], or [facility] although I'm not sure [facility] handles her issues.  Regarding psychiatry, try Dr. Reddy/[facility] or [facility] or other.  I would like to get prior records from [facility], Dr. Dewaine Oats, recent labs from Dr. Kenna Gilbert office gastroenterology, and most recent couple office notes [facility].  Good luck with this one!

## 2013-09-27 ENCOUNTER — Telehealth: Payer: Self-pay | Admitting: Internal Medicine

## 2013-09-27 NOTE — Telephone Encounter (Signed)
Try and get her in with a neurology- she reportedly has hx/o Multiple Sclerosis, narcolepsy, and insomnia.    Try and get her in with a psychiatrist due to hx/o Bipolar, Depression, Anxiety, and on Vyvanse for some reason, but this is not clear.    As I told patient, I don't feel comfortable refilling or managing her medications for the above mentioned problems.

## 2013-09-27 NOTE — Telephone Encounter (Signed)
chandra please handle this 

## 2013-09-27 NOTE — Telephone Encounter (Signed)
Pt insurance will not cover veramyst nasal spray. Benefit plan alternative medications includes budesonide. Please send in new med with strength,directions, quantity and refills to [facility] [place], [place]

## 2013-09-30 ENCOUNTER — Telehealth: Payer: Self-pay | Admitting: Gastroenterology

## 2013-09-30 NOTE — Telephone Encounter (Signed)
On call note. Pt of Dr Loreta Ave calling at 0400 asking for medication for sleep. I advised her to use Tylenol PM which she has at home. I advised her to contact her PCP about sleeping problems. She asked again for a prescription sleeping med. I declined. After that she said she is having pain from reflux and she asked me to prescribe a pain medication. I declined. Advised TUMS or go to ED if she is having pain that TUMS does not relieve. Advised her to contact Dr Loreta Ave on Monday for advice on GI problems.

## 2013-10-01 ENCOUNTER — Telehealth: Payer: Self-pay | Admitting: Internal Medicine

## 2013-10-01 NOTE — Telephone Encounter (Signed)
[facility] called and states that her last visit with them was march of 2013 and they will try to send records on her as soon as they can.

## 2013-10-01 NOTE — Telephone Encounter (Signed)
Pt called states that she is trying her best to get the records here as soon as possible because she needs her meds.  She wanted to get a promise or commitment from [place] that he would indeed prescribe those medications whenever he gets the records.  I advised that was up to his discretion after he reviews the records and I could not give her that commitment.  She would like the office manager to call her regarding this.

## 2013-10-01 NOTE — Telephone Encounter (Signed)
Anne Black, I wasn't here am I suppose to do anything with this? Anne Black told me to ask you. CLS

## 2013-10-01 NOTE — Telephone Encounter (Signed)
Karren Burly - check with Lafonda Mosses first.   You may not need to call about these.

## 2013-10-01 NOTE — Telephone Encounter (Signed)
Wanted you to see she called in this morning to [place].   This is the one we discussed this morning.

## 2013-10-01 NOTE — Telephone Encounter (Signed)
Pt called stating that she needs either a beta blocker or Vistaril for pounding heart rate when she waking up in the middle of the night multiple times having anxiety. She would like something to slower her heart rate down. She is also having difficulty breathing when she wakes up at night too. Please send in something to [facility] [place]

## 2013-10-02 ENCOUNTER — Telehealth: Payer: Self-pay | Admitting: Medical

## 2013-10-02 ENCOUNTER — Telehealth: Payer: Self-pay | Admitting: Family Medicine

## 2013-10-02 ENCOUNTER — Encounter: Payer: Self-pay | Admitting: Family Medicine

## 2013-10-02 NOTE — Telephone Encounter (Signed)
We have dismiised this patient.   We will not be following this patient.

## 2013-10-02 NOTE — Telephone Encounter (Signed)
I refilled several of her non controlled, non psychiatric, non pain medications already.  The veramyst got blocked, so she can try OTC Flonase or Nasacort.

## 2013-10-02 NOTE — Telephone Encounter (Signed)
Patient would like to know the status of you re-filling her other medications?  Patient is aware of the medications that you did refill. CLS

## 2013-10-02 NOTE — Telephone Encounter (Signed)
I have called patient this morning [phone] and advised her we will not be able to care for her at the level she is wanting.  And she said why.  I explained she called over 6 times yesterday and through the weekend.  Vincenza Hews is waiting on all her records so he can determine what he can refill.  She states she is out of Visteril, Buspar, Vyvanse, Provigil and Nuvigil.  I advised I will speak with Vincenza Hews today and call her back this afternoon.  I also advised the patient to go ahead and find another provider.  She states she has 30 days here and I agreed that she has 30 days for emergency visits.  Again she wanted to tell me all that she needed. So I asked her had she not already explained that to [facility] at her visit and she said yes.

## 2013-10-02 NOTE — Telephone Encounter (Signed)
I stated in the message to you that I did send several of her routine medications to the pharmacy.  That is ALL I am filling for her.  The other medications should be under the discretion of psychiatry and neurology and I am NOT filling those as I told her in person that I would not prescribe those other.  The pharmacy should have all the new scripts I sent.

## 2013-10-03 ENCOUNTER — Telehealth: Payer: Self-pay | Admitting: Family Medicine

## 2013-10-03 ENCOUNTER — Other Ambulatory Visit: Payer: Self-pay | Admitting: Medical

## 2013-10-03 ENCOUNTER — Telehealth: Payer: Self-pay | Admitting: Medical

## 2013-10-03 NOTE — Telephone Encounter (Signed)
Also advised pt that there would be no further refills from this point.  That Anne Black has refilled all he is going to.

## 2013-10-03 NOTE — Telephone Encounter (Signed)
Pt left a message on my voice mail that she has talked with Deboraha Sprang and [facility] an that we should have those records and we should have enough information to make a clinical decision to refill her meds.  She states we are dismissing her based on PURE T DISCRIMINATION because she has MS and is a complex patient.  Because pt has a voice and speaking up and that bothers her greatly and sad.  But in the meantime she needs records faxed to [facility] they have specialties that might be a good match.  Pt hates that she is having to go through this and hates we would put a pt through this with MS, high anxiety, depression.  Pt hates that we are so insensitive that we would put her through that and that is who we are.  The phone message disconnected.

## 2013-10-03 NOTE — Telephone Encounter (Signed)
I'm forwarding this to you Anne Black. CLS

## 2013-10-03 NOTE — Telephone Encounter (Signed)
I called patient back explained to her we are not dismissing her because of her medical issues.   That we are not discriminating against her.  I explained that when we saw her for the 1st time that was not a guarantee that we were going to care for her.  I also advised her that Vincenza Hews had told her on her very first visit that he would not be refilling any of her controlled substances.  She states she thought if she got him all the records her would refill.  I explained that Vincenza Hews said he would not refill the controlled substances.  And that she is to call back up here if she has an emergency.  She states she has emergencies at night time when she wakes up and cannot breathe.  And she is out of her anxiety meds, etc.  I explained that she would need to call back to the provider that wrote her those rx.  That we did not write those RX and we were not going to.  If a true emergency than she will need to go to the ER.

## 2013-10-04 ENCOUNTER — Telehealth: Payer: Self-pay | Admitting: Family Medicine

## 2013-10-04 ENCOUNTER — Other Ambulatory Visit: Payer: Self-pay | Admitting: Internal Medicine

## 2013-10-04 NOTE — Telephone Encounter (Signed)
Do I need to respond to this, or is this just the [facility] for documentation?

## 2013-10-04 NOTE — Telephone Encounter (Signed)
Pt called and asked wasn't she supposed to call us back. And I told her no.  She states yes she thinks she was about writing rx for emergency for 30 days.  I explained again that Vincenza Hews was not going to refill any of those.  She states she thought since he got the records that he should have enough info to make a clinical decision to refill those meds.  She states you are talking with another practitioner and I asked who?  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  So I said then you should understand that Vincenza Hews does not fill comfortable filling any further meds. And she states he is obligated to fill those meds since he has her records and I explained he is not obligated.

## 2013-10-05 ENCOUNTER — Telehealth: Payer: Self-pay | Admitting: Medical

## 2013-10-05 NOTE — Telephone Encounter (Signed)
Recv'd prior authorization request for Veramyst, states not covered by pt's ins.  Per Vincenza Hews need change to OTC Flonase.   Please call pt and advise

## 2013-10-06 ENCOUNTER — Telehealth: Payer: Self-pay | Admitting: Gastroenterology

## 2013-10-06 MED ORDER — PANTOPRAZOLE SODIUM 40 MG PO TBEC: 40.0000 mg | DELAYED_RELEASE_TABLET | Freq: Every day | ORAL | Status: DC

## 2013-10-06 NOTE — Telephone Encounter (Signed)
Called, asked for refill of protonix.  Tells me she is a patient of Dr. Kenna Gilbert, has seen her in past month.  Happy to refill. Also let her know that pantoprazole is available OTC. She also asked to refill other, non-gi related meds.  I declined.

## 2013-10-08 ENCOUNTER — Encounter: Payer: Self-pay | Admitting: Medical

## 2013-10-08 NOTE — Telephone Encounter (Signed)
No, just FYI

## 2013-10-08 NOTE — Telephone Encounter (Signed)
Pt called again today.  Wanting [place] to reconsider her as a new patient.  She states she thought things went well for her first visit and she understands that 1st visits could be rocky sometimes.  She wanted to know could we order some pulmonary test and I explained no we cannot as we are not going to follow her care.  She states we would not need to follow her care just order the test.  I explained if we ordered.  We would have to follow.  She said ok.

## 2013-10-10 NOTE — Telephone Encounter (Signed)
Pt aware.

## 2013-10-11 ENCOUNTER — Telehealth: Payer: Self-pay

## 2013-10-11 NOTE — Telephone Encounter (Signed)
Pt advised to RTC to get a referral for pulmonologist.

## 2013-10-11 NOTE — Telephone Encounter (Signed)
Patient requesting to know if our office can test her breathing capacity degree? Per patient she has asthma and she is a smoker. Patient is requesting our office to refer her out if our office does not offer the testing. Patients call back number is [phone]

## 2013-10-12 NOTE — Telephone Encounter (Signed)
tsd  °

## 2013-10-19 ENCOUNTER — Ambulatory Visit (INDEPENDENT_AMBULATORY_CARE_PROVIDER_SITE_OTHER): Payer: Managed Care, Other (non HMO) | Admitting: Family Medicine

## 2013-10-19 VITALS — BP 122/80 | HR 80 | Temp 97.7°F | Resp 16 | Ht 63.0 in | Wt 166.0 lb

## 2013-10-19 DIAGNOSIS — F341 Dysthymic disorder: Secondary | ICD-10-CM

## 2013-10-19 DIAGNOSIS — F988 Other specified behavioral and emotional disorders with onset usually occurring in childhood and adolescence: Secondary | ICD-10-CM

## 2013-10-19 DIAGNOSIS — G35 Multiple sclerosis: Secondary | ICD-10-CM

## 2013-10-19 DIAGNOSIS — K219 Gastro-esophageal reflux disease without esophagitis: Secondary | ICD-10-CM

## 2013-10-19 DIAGNOSIS — G609 Hereditary and idiopathic neuropathy, unspecified: Secondary | ICD-10-CM

## 2013-10-19 DIAGNOSIS — I10 Essential (primary) hypertension: Secondary | ICD-10-CM

## 2013-10-19 DIAGNOSIS — Z8709 Personal history of other diseases of the respiratory system: Secondary | ICD-10-CM

## 2013-10-19 DIAGNOSIS — F329 Major depressive disorder, single episode, unspecified: Secondary | ICD-10-CM

## 2013-10-19 DIAGNOSIS — F419 Anxiety disorder, unspecified: Secondary | ICD-10-CM

## 2013-10-19 DIAGNOSIS — F32A Depression, unspecified: Secondary | ICD-10-CM

## 2013-10-19 MED ORDER — HYDROXYZINE HCL 25 MG PO TABS: ORAL_TABLET | ORAL | Status: DC

## 2013-10-19 MED ORDER — FLUOXETINE HCL 40 MG PO CAPS: 40.0000 mg | ORAL_CAPSULE | Freq: Every day | ORAL | Status: DC

## 2013-10-19 MED ORDER — BUSPIRONE HCL 15 MG PO TABS: 15.0000 mg | ORAL_TABLET | Freq: Two times a day (BID) | ORAL | Status: DC

## 2013-10-19 NOTE — Progress Notes (Signed)
Subjective: Patient is here for many reasons. She keeps rambling on someone problems in the next. I tried to convince what her problems are. She has not been able to find a regular Dr. She goes to a doctor in [place] for her MS. She has anxiety and depression. 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 She would like Vistaril and BuSpar. She goes to a subtle pharmacies, and her medicine list is very scattered. She has been to a doctor at [place] but has quit going there. She wants a new primary care, but I explained that I do not take regular primary care in the appointment setting. Many nodes are in Epic regarding her, and T[facility] recently did a fairly good review of things.  Objective: No acute distress. Very anxious. Smells strongly of cigarette smoke. TMs normal. Throat clear. Neck supple without nodes. Chest clear. Heart regular without murmurs. No edema.  Assessment: Polypharmacy Anxiety History of multiple sclerosis History of hypertension Depression History of chronic pain History of abdominal muscle spasms  Plan:  See orders and instructions   strongly urge you to try and get to where you can take less your medications around more.  It is very important that you make sure neurologist decide what would be best for your overall health given that you have multiple sclerosis. That is the most important of your diseases and needs to be managed well.  You need to start trying to minimize your psychiatric and controlled medications, giving him down to a minimum number of things. When you're taking too many medications you start treating symptoms are caused by the other medications  Take the hydroxyzine 50 mg (Vistaril) twice daily, 3 times a day necessary. This should help sleep some also.  Try to get regular exercise.  It is very important that you use only one pharmacy to avoid drug drug interactions of your medications  I cannot manage  her many medical problems from this setting in the walk in clinic.

## 2013-10-19 NOTE — Patient Instructions (Addendum)
I strongly urge you to try and get to where you can take less your medications around more.  It is very important that you make sure neurologist decide what would be best for your overall health given that you have multiple sclerosis. That is the most important of your diseases and needs to be managed well.  You need to start trying to minimize your psychiatric and controlled medications, giving him down to a minimum number of things. When you're taking too many medications you start treating symptoms are caused by the other medications  Take the hydroxyzine 50 mg (Vistaril) twice daily, 3 times a day necessary. This should help sleep some also.  Try to get regular exercise.  It is very important that you use only one pharmacy to avoid drug drug interactions of your medications  Stop smoking

## 2013-10-21 ENCOUNTER — Telehealth: Payer: Self-pay

## 2013-10-21 NOTE — Telephone Encounter (Signed)
Patient is requesting a lidocaine patch for left side pain and also a medication prescription for strateera sent to [facility] on [address]. Patient was advised that an office visit may be required for a medication that's never been prescribed before. Patient phone: [phone]

## 2013-10-21 NOTE — Telephone Encounter (Signed)
(  patient called again because she says she doesn't remember if she called earlier) Patient is requesting lidocaine patch for left side pain. Hurts when she's breathing. She is also wanting to complain about provider and patient communication with Dr. Alwyn Ren.  She doesn't understand why she can not get this medication lidocaine and stratera - ADHD Medication. She says she wants to understand by someone clinical to explain. She was put down and criticized by dr. Alwyn Ren and is extremely upset.  She says difficult provider and was lectured and treated awfuly and patient says she is not going to teach him how to be a doctor when he doesn't know how talk to patients. She also says her stratera medication is not a controlled substance and that Dr. Alwyn Ren doesn't have to freak out about prescribing it.

## 2013-10-22 ENCOUNTER — Encounter: Payer: Self-pay | Admitting: Medical

## 2013-10-22 NOTE — Telephone Encounter (Signed)
Medications pt is calling about were not discussed in OV. Called pharmacy to correct Prozac medication- 31 refills ordered. Changed to 1 as the others. Please advise on these medications.

## 2013-10-22 NOTE — Telephone Encounter (Signed)
Patient calling back stating she is in a lot of pain on her left side under breast by rib cage. Patient upset and states it is ridiculous that she is having to wait for a pain patch that isn't a controlled substances that she could get addicted too. Patient requesting it to be sent in today as soon as possible. Per patient it hurts so much that it is difficult for her to breath. Patient states she does not feel she received adequate care on her last visit. [facility] [place]. Patients call back number is [phone]

## 2013-10-23 NOTE — Telephone Encounter (Signed)
Patient has called again regarding the lidocaine patch. I advised that there were multiple message put in on her behalf and someone will contact her as soon as possible. I advised her to allow 24-48 hours for a return call.  She insisted that she was in extreme pain, I told her to come back in if the pain was that bad. She declined.

## 2013-10-24 NOTE — Telephone Encounter (Signed)
Patient has called frequently since last office visit with Dr. Alwyn Ren. It has been reported by clerical staff that patient is verbally aggressive and abrasive.   LM for pt on cell phone- we are not listed as her PCP and have only had one office visit with her. We did not discuss the use of those medications at her visit and Dr. Alwyn Ren referred her to her neurologist for management of her MS. If pt has any concerns in regards to the issues presented at her last visit to be sure to give [place] a call back or if she would like to discuss another issue she would need to RTC.

## 2013-10-26 NOTE — Telephone Encounter (Signed)
Agree she needs to see her neurologist.  She did not discuss lidoderm with me.

## 2013-10-27 NOTE — Telephone Encounter (Signed)
Noted  

## 2013-10-30 ENCOUNTER — Telehealth: Payer: Self-pay

## 2013-10-30 NOTE — Telephone Encounter (Signed)
Pt states that she was pulled over and received a dui because she was unable to blow in the machine due to respiratory issues she needs documentation from a pulmonologist to bring to court with her, she would like to know if we could refer her to one. Best# [phone]

## 2013-10-30 NOTE — Telephone Encounter (Signed)
LM to advise pt she will need to contact her PCP in regards to this request.

## 2013-12-25 ENCOUNTER — Telehealth: Payer: Self-pay | Admitting: Medical

## 2013-12-25 NOTE — Telephone Encounter (Signed)
Returned call to patient after receiving a voicemail from her inquiring if she could establish at our practice again after being discharged.  The patient was discharged from our practice on 07/19/2012 for non-compliance and being verbally abusive to office staff.  Pt will not be accepted back at our office and needs to find a new provider as previously informed last year when she was discharged.

## 2014-01-18 ENCOUNTER — Ambulatory Visit: Payer: Managed Care, Other (non HMO) | Admitting: Internal Medicine

## 2014-01-23 ENCOUNTER — Ambulatory Visit (INDEPENDENT_AMBULATORY_CARE_PROVIDER_SITE_OTHER): Payer: Managed Care, Other (non HMO) | Admitting: Internal Medicine

## 2014-01-23 ENCOUNTER — Encounter: Payer: Self-pay | Admitting: Internal Medicine

## 2014-01-23 ENCOUNTER — Telehealth: Payer: Self-pay

## 2014-01-23 VITALS — BP 136/80 | HR 91 | Temp 99.1°F | Resp 10 | Ht 62.5 in | Wt 177.0 lb

## 2014-01-23 DIAGNOSIS — IMO0002 Reserved for concepts with insufficient information to code with codable children: Secondary | ICD-10-CM

## 2014-01-23 DIAGNOSIS — K219 Gastro-esophageal reflux disease without esophagitis: Secondary | ICD-10-CM

## 2014-01-23 DIAGNOSIS — F411 Generalized anxiety disorder: Secondary | ICD-10-CM

## 2014-01-23 DIAGNOSIS — M1710 Unilateral primary osteoarthritis, unspecified knee: Secondary | ICD-10-CM

## 2014-01-23 DIAGNOSIS — M179 Osteoarthritis of knee, unspecified: Secondary | ICD-10-CM

## 2014-01-23 DIAGNOSIS — Z720 Tobacco use: Secondary | ICD-10-CM | POA: Insufficient documentation

## 2014-01-23 DIAGNOSIS — G35 Multiple sclerosis: Secondary | ICD-10-CM

## 2014-01-23 DIAGNOSIS — F909 Attention-deficit hyperactivity disorder, unspecified type: Secondary | ICD-10-CM

## 2014-01-23 DIAGNOSIS — F3110 Bipolar disorder, current episode manic without psychotic features, unspecified: Secondary | ICD-10-CM

## 2014-01-23 MED ORDER — TRAZODONE HCL 50 MG PO TABS: 50.0000 mg | ORAL_TABLET | Freq: Every evening | ORAL | Status: DC | PRN

## 2014-01-23 MED ORDER — RIZATRIPTAN BENZOATE 10 MG PO TABS: 10.0000 mg | ORAL_TABLET | ORAL | Status: DC | PRN

## 2014-01-23 MED ORDER — BUPROPION HCL ER (SR) 150 MG PO TB12: 150.0000 mg | ORAL_TABLET | Freq: Two times a day (BID) | ORAL | Status: DC

## 2014-01-23 MED ORDER — GABAPENTIN 300 MG PO CAPS: ORAL_CAPSULE | ORAL | Status: DC

## 2014-01-23 MED ORDER — PROMETHAZINE HCL 25 MG PO TABS: 25.0000 mg | ORAL_TABLET | Freq: Three times a day (TID) | ORAL | Status: DC | PRN

## 2014-01-23 MED ORDER — RISPERIDONE 0.5 MG PO TABS: 1.5000 mg | ORAL_TABLET | Freq: Two times a day (BID) | ORAL | Status: DC

## 2014-01-23 NOTE — Telephone Encounter (Signed)
Left message on voicemail with responses. Patient to return calls if questions or concerns

## 2014-01-23 NOTE — Telephone Encounter (Signed)
1.) we discussed that since she is no longer receiving chemtx, insurance not likely to cover zofran. Try phenergan po 1st (as she never has) and if not effective, will rx zofran. 2.) She will need to see Dermatology for her retin-A cream

## 2014-01-23 NOTE — Patient Instructions (Signed)
Return in 1 month for follow-up visit

## 2014-01-23 NOTE — Progress Notes (Addendum)
   Subjective:    Patient ID: Anne Black, female    DOB: 01/03/1961, 53 y.o.   MRN: [id number]  HPI 53 yo female presents today to establish care.   Hx MS and sees Dr Fara Olden at [facility] in [place]. She was told she needs another MRI to f/u on MS. She has not scheduled it.  She was told she needs to see a MS specialist.  Increased anxiety and panic attacks. She has SOB and awakens from sleep. Tried Buspar in the past but ineffective. Also tried xanax (helped), buspar and clonopin. She has seen psychiatry in the past.   Needs med RF. Requests wellbutrin, zofran (in lieu of phenergan supp), tri-luma, vyvanse. Requests hydrocodone prn for severe pain due to MS  She needs colonoscopy for screening. She has never had one. She has seen GI Dr Loreta Ave in the past.    Review of Systems  Constitutional: Positive for fatigue. Negative for fever, chills, diaphoresis, activity change and appetite change.  HENT: Negative for ear pain and sore throat.   Eyes: Negative for visual disturbance.  Respiratory: Positive for shortness of breath. Negative for cough and chest tightness.   Cardiovascular: Negative for chest pain, palpitations and leg swelling.  Gastrointestinal: Positive for nausea. Negative for vomiting, abdominal pain, diarrhea, constipation and blood in stool.  Genitourinary: Negative for dysuria.  Musculoskeletal: Negative for arthralgias.  Neurological: Positive for headaches. Negative for dizziness, tremors and numbness.  Psychiatric/Behavioral: Positive for sleep disturbance. The patient is nervous/anxious.        Objective:   Physical Exam   CONSTITUTIONAL: Looks well in NAD. Awake, alert and oriented x 3 HEENT: PERRLA. Oropharynx clear and without exudate NECK: Supple. Nontender. No palpable cervical or supraclavicular lymph nodes. No carotid bruit b/l.  CVS: Regular rate without murmur, gallop or rub. LUNGS: reduced BS at base but CTA b/l no wheezing, rales or  rhonchi. ABDOMEN: Bowel sounds present x 4. Soft, nondistended. No palpable mass or bruit. RUQ TTP but no r/g/r EXTREMITIES: No edema b/l. Distal pulses palpable. No calf tenderness. PSYCH: Affect, behavior and mood normal        Assessment & Plan:     ICD-9-CM ICD-10-CM   1. Generalized anxiety disorder 300.02 F41.1   2. Bipolar I disorder, most recent episode (or current) manic 296.40 F31.10   3. MS (multiple sclerosis) 340 G35   4. Gastroesophageal reflux disease, esophagitis presence not specified 530.81 K21.9   5. Osteoarthrosis, unspecified whether generalized or localized, involving lower leg 715.96 M17.9   6. Tobacco abuse 305.1 Z72.0   7. Attention deficit hyperactivity disorder (ADHD), unspecified ADHD type 314.01 F90.9    - she needs to f/u with psychiatry for mx of anxiety, bipolar, and ADHD. Start wellbutrin SR BID and clonopin and continue psych meds as above  - smoking cessation discussed and highly urged. rx wellbutrin SR  -refer to MS specialist; encouraged her to take meds as rx  - f/u in 1 month to reck efficacy of meds  - she will need a screening colonoscopy in early 2016 as she has never had one  - obtain old records  - [place] controlled substance query reviewed and scanned into chart  - She declined labs today stating she had some drawn at [facility] recently  - Hold flu shot due to low grade temp. Will reassess at next OV whether vaccine can be given then

## 2014-01-23 NOTE — Telephone Encounter (Signed)
Following appointment today:  1.) Patient has tired Zofran in the past and would highly prefer that medication over phenergan, please advise if ok to change medication and send in rx  2.) Patient would like to know why provider will not fill Retin-A cream, please advise

## 2014-01-24 ENCOUNTER — Telehealth: Payer: Self-pay | Admitting: *Deleted

## 2014-01-24 DIAGNOSIS — R110 Nausea: Secondary | ICD-10-CM

## 2014-01-24 DIAGNOSIS — R1084 Generalized abdominal pain: Secondary | ICD-10-CM

## 2014-01-24 NOTE — Telephone Encounter (Signed)
Patient called and stated that she was still running a low grade fever, nausea and stomach hurts. Patient wants a GI Cocktail called to pharmacy. Patient also stated that when her Sodium and Potassium is low it causes nausea. Patient wants Zofran called in because it is better than Phenergan and she wanted to let you know that she has also already tried the Suppositories and they didn't work either. Please Advise.

## 2014-01-25 ENCOUNTER — Other Ambulatory Visit: Payer: Self-pay | Admitting: *Deleted

## 2014-01-25 MED ORDER — ONDANSETRON HCL 4 MG PO TABS: 4.0000 mg | ORAL_TABLET | Freq: Three times a day (TID) | ORAL | Status: DC | PRN

## 2014-01-25 MED ORDER — CLONAZEPAM 0.5 MG PO TABS: ORAL_TABLET | ORAL | Status: DC

## 2014-01-25 NOTE — Telephone Encounter (Signed)
Patient notified and agreed. She will call back to schedule a lab appointment. Lab and Ultrasound Order placed. Called in Zofran and D/C Phenergan. Pharmacy needs to know the Ingredients and parts for the GI Cocktail. Please Advise.

## 2014-01-25 NOTE — Telephone Encounter (Signed)
GI cocktail is 30 mL mylanta + 10 mL viscous lidocaine 2% + 10 mL Donnatal by mouth x 1. No RF  Please instruct patient to take protonix as Rx

## 2014-01-25 NOTE — Telephone Encounter (Signed)
Pt needs lab (CMP and CBC with diff) due to abdominal pain and nausea. Ok to have  GI cocktail with no RF. Stop phenergan. Rx Zofran 4mg  #20 take 1 tab po q8hrs prn nausea no RF. May need gallbladder US if she has not had one yet.

## 2014-01-25 NOTE — Telephone Encounter (Signed)
Called into pharmacy and patient aware.

## 2014-01-25 NOTE — Telephone Encounter (Signed)
[facility].  

## 2014-02-04 ENCOUNTER — Telehealth: Payer: Self-pay | Admitting: *Deleted

## 2014-02-14 ENCOUNTER — Telehealth: Payer: Self-pay | Admitting: *Deleted

## 2014-02-14 MED ORDER — DESVENLAFAXINE SUCCINATE ER 50 MG PO TB24: ORAL_TABLET | ORAL | Status: DC

## 2014-02-14 NOTE — Telephone Encounter (Signed)
Faxed Rx into pharmacy and patient notified. 

## 2014-02-14 NOTE — Telephone Encounter (Signed)
Patient called and stated that you and her talked about her taking Pristiq. Patient wants to try it. Please Advise.

## 2014-02-14 NOTE — Telephone Encounter (Signed)
Rx pristiq 50mg  #30 take 1 tab po daily with 3 RF

## 2014-02-18 ENCOUNTER — Telehealth: Payer: Self-pay

## 2014-02-18 NOTE — Telephone Encounter (Signed)
Patient left message on triage voicemail- patient with multiple concerns- no current treatment for MS, long-term sleep issues, unable to complete daily task due to pain and no rest. Patient is currently getting Vyvanse rx'ed by specialist, patient is not happy with the dose and would like for Dr.Carter to rx. Patient would also like for Dr.Carter to rx Provigil.   I called patient to inform her that with multiple concerns we need to schedule appointment for follow-up, I informed patient that Dr.Carter will not rx provigil or vyvanse as she indicated this at last OV. Patient states she could not remember all that was discussed at last OV. Patient also requested names of other psych providers for she needs a new one, patient was given the [facility] to [facility]. Patient was not interested in [facility] for they do not rx medications.   Patient did not feel there is need to schedule another appointment, patient will keep pending follow-up as planned.   Message will be sent to Dr.Carter as a [place]

## 2014-02-19 NOTE — Telephone Encounter (Signed)
Patient called back and stated that she needs her message to be handled now. Medically appropriately. If we can't manage her MS, she has a level of pain everyday, "you" should know this. Patient is having a lot of migraine headaches. She stated that she is tired of begging for medications when you are suppose to medically treat her. Doesn't understand why you are being so "stingy"  She want to know How is a patient suppose to know what your limits are about what you are treating? Wants an answer. She states you are paid to evaluate and treat her diagnosis through the insurance company and you are not doing your job and she wants to know why. Patient is yelling on the phone stating you are licking the toilet bowl.

## 2014-02-20 ENCOUNTER — Other Ambulatory Visit: Payer: Self-pay | Admitting: Internal Medicine

## 2014-02-20 NOTE — Telephone Encounter (Signed)
Pt was instructed at her last OV that she needs to f/u with neurology and/or a MS specialist regarding treatment of her MS. She also needs to f/u with psych for her mental health needs. I will not continue to Rx controlled substances for her.

## 2014-02-20 NOTE — Telephone Encounter (Signed)
Patient is calling and left a detailed message stating that she has a Migraine and needs a refill on her Zofran. She has been taking double the Zofran and is out. Patient raised her voice stating that the CMA's are ignorant and she has a 4 year degree and our CMA expertise is makeup. She stated that her insurance pays a lot of money to [place] for [place] not to be doing our jobs. Stated that we accused her of "insisting" that we prescribe medications. She has prescriptions that she forgot to tell us about and she can't insist [place] to call anything in and that is communication ignorance. We only care about what we look like when we come into the office, not the care of patient just the look of our makeup! And hung up. Informed Aram Beecham and sent message to Dr. Montez Morita.

## 2014-02-20 NOTE — Progress Notes (Signed)
Patient called me on call just after 5pm 02/19/14.  She was talking very fast and requesting pain medication for her chronic migraine headaches.  I explained that we do not prescribe pain medications on call.  If her pain was so severe, she needed to be seen immediately in an urgent care of ER.  She also began going on about racial differences between herself and another provider and saying we don't know how to manage her headaches.  Told her to seek a provider somewhere else if she is not satisfied.  She went on for a while and hung up on me.

## 2014-02-21 ENCOUNTER — Telehealth: Payer: Self-pay | Admitting: Internal Medicine

## 2014-02-21 ENCOUNTER — Other Ambulatory Visit: Payer: Self-pay | Admitting: Internal Medicine

## 2014-02-21 NOTE — Telephone Encounter (Signed)
I called Anne, Anne Black, and immediatiy she became upset and would not listen to anything I had to say.  Pt was very rude demanding her  medication.  Told pt, per Dr. Montez Morita, Anne Black  would have to be evaluated via office visit before getting any pain medication. Says she was not coming to the appointment, that we needed to cancel it.  Anne Black asked if I was a black female.  I responded to Anne Black, by saying.  I do not think by race has anything to do with her care.  I suggested to Anne Black to keep this appointment, instead, she  became very upset, begin to screen over the telephone, and hung up...the appointment was cancelled. I discussed this situation with Dr. Montez Morita, and we both agreed to discharge Anne. Anne Black, for non=compliance and being verbally abusive to the office staff.  See Triage notes....cdavis

## 2014-02-22 ENCOUNTER — Ambulatory Visit: Payer: Managed Care, Other (non HMO) | Admitting: Internal Medicine

## 2014-02-22 NOTE — Telephone Encounter (Signed)
Patient called yesterday and was very angry because we won't prescribe her the medications that specialist from [facility] prescribed her in the past. She is angry that she has a 4 year degree and we won't listen to her recommendations. She states she has to beg for medications and she has to go around and round to get what she wants. She stated and that "medical assistant with all that eyeshadow" put her against my knowledge Ill go against her any day and yours!!!" Yelling this, and then hung up.  Informed Aram Beecham, office manager, and she stated that she will call patient.

## 2014-02-26 ENCOUNTER — Telehealth: Payer: Self-pay | Admitting: *Deleted

## 2014-02-26 NOTE — Telephone Encounter (Signed)
Patient called and stated that her klonopin medication stated that she was bipolar and that it was written on the label of the bottle. She also stated that this needs to be corrected, as soon as possible because it is not true. I informed her that I would give Dr. Montez Morita her message right away.

## 2014-02-28 ENCOUNTER — Other Ambulatory Visit: Payer: Self-pay | Admitting: *Deleted

## 2014-02-28 MED ORDER — ALBUTEROL SULFATE HFA 108 (90 BASE) MCG/ACT IN AERS: 2.0000 | INHALATION_SPRAY | Freq: Four times a day (QID) | RESPIRATORY_TRACT | Status: DC | PRN

## 2014-02-28 MED ORDER — FLUTICASONE-SALMETEROL 100-50 MCG/DOSE IN AEPB: 1.0000 | INHALATION_SPRAY | Freq: Two times a day (BID) | RESPIRATORY_TRACT | Status: DC

## 2014-02-28 NOTE — Telephone Encounter (Signed)
Patient called and wanted refills on her inhalers. Faxed to pharmacy and patient aware.

## 2014-03-01 ENCOUNTER — Other Ambulatory Visit: Payer: Self-pay | Admitting: Internal Medicine

## 2014-03-04 ENCOUNTER — Encounter: Payer: Self-pay | Admitting: Internal Medicine

## 2014-03-04 ENCOUNTER — Other Ambulatory Visit: Payer: Self-pay

## 2014-03-04 MED ORDER — RIZATRIPTAN BENZOATE 10 MG PO TABS: 10.0000 mg | ORAL_TABLET | ORAL | Status: DC | PRN

## 2014-03-04 NOTE — Telephone Encounter (Signed)
Rx sent electronically.  

## 2014-03-06 ENCOUNTER — Encounter: Payer: Self-pay | Admitting: Internal Medicine

## 2014-03-12 ENCOUNTER — Telehealth: Payer: Self-pay | Admitting: Internal Medicine

## 2014-03-12 NOTE — Telephone Encounter (Signed)
Anne Black called today, needing her medication.  Based on  previous conversation from the clinical staff.  They explained to Anne Black, she needed an office visit to discuss pain meds with provider.   Anne. Black refused.  Every conversation with Anne Black has been very difficult.  Told patient our intent is to give her good patient care, and we can not do that without her being complaint to the  Physicians recommendations.  Patient ended our call by just handing up, no response to my comment.   cdavis

## 2014-03-13 ENCOUNTER — Telehealth: Payer: Self-pay | Admitting: *Deleted

## 2014-03-13 NOTE — Telephone Encounter (Signed)
1. Patient wants to know why we released her from our practice. She doesn't understand why the Provider herself didn't call her and tell her why. She stated she is entitled to a reason and wants one.   2. She wants to know why her Klonopin was Decreased. She thinks she was taking 1mg  and now its only 0.5mg  and she wants the 1mg  back because she uses it for Panic Attacks which get bad at night and not Bipolar.  3. She stated that the Gabapentin is causing significant weight gain and wants something else for Neuropathic Pain. She stated that she went back to taking her Meloxicam which causes GI issues and her GI Dr. Rosine Abe want her to take. Weight gain is causing joints to hurt worse so she wants something different.  4.Has MS and Osteoarthritis and on no Pain Medication. She stated that she cannot survive at that level of pain without relief, that's why she went back to taking the Meloxicam.  5. Patient stated that she has been on Vyvanse for years now. Ever since 1994 when diagnosed. Vyvanse makes a significant difference and takes it up to 2 a day. She stated that she needs and entitled to medications the help her function better. Stated that she has 32 leisons on the brain. She stated that she DOESN'T want you to refer her to [facility] to a Neurologist because going forward she doesn't want your negativity, so she will make her own appointment. She stated that she is asking for compassion and needs a refill on this.   Please Advise.

## 2014-03-13 NOTE — Telephone Encounter (Signed)
Noted  

## 2014-03-13 NOTE — Telephone Encounter (Signed)
Please inform her that no medication changes will be made at this time. Her clonazepam dose did not change. She was rx the dose she told us she was taking. She will need to speak with neurology regarding a different nerve pain med as she has tried various ones in the past without adequate response and/or they were costly. Recommend she continues the gabapentin until seen by neurology. She needs to f/u with a MS specialist to treat her MS, including the associated pain. No pain meds will be Rx from this office. Vyvanse Rx will not be prescribed. She needs to f/u with psych.

## 2014-03-14 NOTE — Telephone Encounter (Signed)
Patient Notified and patient found Rx in a basket on her table.

## 2014-03-14 NOTE — Telephone Encounter (Signed)
No we cannot provide another klonopin Rx.

## 2014-03-14 NOTE — Telephone Encounter (Signed)
LMOM to return call.

## 2014-03-14 NOTE — Telephone Encounter (Signed)
Patient stated that she picked up her Klonopin this weekend and have lost it. Can we call in another Rx for this because patient cannot find it. She doesn't know if it was taken or she misplaced. Patient would like for you to call her about her discharge.

## 2014-03-15 ENCOUNTER — Telehealth: Payer: Self-pay | Admitting: *Deleted

## 2014-03-15 NOTE — Telephone Encounter (Signed)
Patient called and wanted a Psychiatrist Number. Gave her [facility] Number. Patient wanted to have Vyvance filled but Dr. Montez Morita stated she will not refill it that patient needs to follow up with her Psychiatrist and Neurologist. Reconfirmed this with patient.

## 2014-04-04 ENCOUNTER — Ambulatory Visit (INDEPENDENT_AMBULATORY_CARE_PROVIDER_SITE_OTHER): Payer: Private Health Insurance - Indemnity | Admitting: Psychology

## 2014-04-04 DIAGNOSIS — F4323 Adjustment disorder with mixed anxiety and depressed mood: Secondary | ICD-10-CM

## 2014-06-13 ENCOUNTER — Other Ambulatory Visit: Payer: Self-pay | Admitting: Neurology

## 2014-06-13 DIAGNOSIS — R269 Unspecified abnormalities of gait and mobility: Secondary | ICD-10-CM

## 2014-06-21 ENCOUNTER — Other Ambulatory Visit (HOSPITAL_COMMUNITY): Payer: Self-pay

## 2014-07-02 ENCOUNTER — Other Ambulatory Visit (HOSPITAL_COMMUNITY): Payer: Self-pay | Admitting: Respiratory Therapy

## 2014-07-02 DIAGNOSIS — G4730 Sleep apnea, unspecified: Secondary | ICD-10-CM

## 2014-10-29 ENCOUNTER — Emergency Department (HOSPITAL_COMMUNITY)
Admission: EM | Admit: 2014-10-29 | Discharge: 2014-10-30 | Disposition: A | Discharge status: Other Acute Inpatient Hospital | Payer: Managed Care, Other (non HMO) | Attending: Emergency Medicine | Admitting: Emergency Medicine

## 2014-10-29 ENCOUNTER — Encounter (HOSPITAL_COMMUNITY): Payer: Self-pay | Admitting: Emergency Medicine

## 2014-10-29 DIAGNOSIS — G43909 Migraine, unspecified, not intractable, without status migrainosus: Secondary | ICD-10-CM | POA: Insufficient documentation

## 2014-10-29 DIAGNOSIS — Z720 Tobacco use: Secondary | ICD-10-CM | POA: Diagnosis not present

## 2014-10-29 DIAGNOSIS — M1990 Unspecified osteoarthritis, unspecified site: Secondary | ICD-10-CM | POA: Insufficient documentation

## 2014-10-29 DIAGNOSIS — Z7951 Long term (current) use of inhaled steroids: Secondary | ICD-10-CM | POA: Insufficient documentation

## 2014-10-29 DIAGNOSIS — F3110 Bipolar disorder, current episode manic without psychotic features, unspecified: Secondary | ICD-10-CM | POA: Diagnosis not present

## 2014-10-29 DIAGNOSIS — Z008 Encounter for other general examination: Secondary | ICD-10-CM | POA: Diagnosis present

## 2014-10-29 DIAGNOSIS — F319 Bipolar disorder, unspecified: Secondary | ICD-10-CM | POA: Diagnosis not present

## 2014-10-29 DIAGNOSIS — F1020 Alcohol dependence, uncomplicated: Secondary | ICD-10-CM | POA: Diagnosis present

## 2014-10-29 DIAGNOSIS — R45851 Suicidal ideations: Secondary | ICD-10-CM

## 2014-10-29 DIAGNOSIS — M810 Age-related osteoporosis without current pathological fracture: Secondary | ICD-10-CM | POA: Diagnosis not present

## 2014-10-29 DIAGNOSIS — Z79899 Other long term (current) drug therapy: Secondary | ICD-10-CM | POA: Insufficient documentation

## 2014-10-29 MED ORDER — BUTALBITAL-APAP-CAFFEINE 50-325-40 MG PO TABS
1.0000 | ORAL_TABLET | Freq: Once | ORAL | Status: AC
  Administered 2014-10-29: 1 via ORAL
  Filled 2014-10-29: qty 1

## 2014-10-29 NOTE — ED Notes (Signed)
Delay in lab draw, pt in bathroom 

## 2014-10-29 NOTE — ED Notes (Signed)
Pt brought in by [employer] under IVC paperwork that states- "danger to self and others, respondent has been previously committed six times at [facility]. According to petitioner, respondent has stated multiple times over the past 24 hours that she will commit suicide. Abusive language and behavior towards her mother and daughters. Repeatedly says she wants to die and wishing that others were dead. Frequent consumption of alcohol and abuse of prescription drugs. Declines family member offers to help."   Upon entering into room, patient adamantly denies SI/HI. Says, "I just need all of my medications refilled. Can you let the doctor know? I want to make sure they know that." Reports recent increased stress in her family. No other c/c.

## 2014-10-29 NOTE — ED Provider Notes (Addendum)
CSN: [id number]     Arrival date & time 10/29/14  2156 History   First MD Initiated Contact with Patient 10/29/14 2243     Chief Complaint  Patient presents with  . IVC       (Consider location/radiation/quality/duration/timing/severity/associated sxs/prior Treatment) The history is provided by the patient.  Anne Black is a 54 y.o. female hx of bipolar, ADHD, multiple sclerosis here with suicidal ideation. Patient has been telling her family that she has thoughts of committing suicide. She also has been aggressive towards her mother and daughters. Patient currently denies any suicidal or homicidal ideation. He says she is under a lot of stress. She does drink some alcohol and does abuse some prescription drugs. Of note, patient has history of multiple sclerosis. However she is not happy with her neurologist and wanted a second opinion at [facility] and [place]. She had "chemo" in 2013 (no documentation in her current chart) and then she had worsening neuropathy. Denies blurry vision. Denies worsening weakness. She is requesting all her medication refilled. Has multiple psych admissions previously.    Past Medical History  Diagnosis Date  . Multiple sclerosis   . Arthritis   . Anxiety   . Bipolar disorder   . ADHD (attention deficit hyperactivity disorder)   . Migraine   . Osteoporosis    Past Surgical History  Procedure Laterality Date  . Cesarean section  [id number]  . Myringotomy with tube placement Bilateral   . Tonsillectomy     Family History  Problem Relation Age of Onset  . Heart disease Mother    Social History  Substance Use Topics  . Smoking status: Current Every Day Smoker -- 1.50 packs/day for 20 years    Types: Cigarettes  . Smokeless tobacco: Never Used  . Alcohol Use: 4.4 oz/week    4 Standard drinks or equivalent, 2 Glasses of wine, 2 Cans of beer per week     Comment: pt states she drinks every 2-3 days   OB History    No data available     Review of Systems   Psychiatric/Behavioral: Positive for suicidal ideas.  All other systems reviewed and are negative.     Allergies  Review of patient's allergies indicates no known allergies.  Home Medications   Prior to Admission medications   Medication Sig Start Date End Date Taking? Authorizing Provider  albuterol (PROVENTIL HFA;VENTOLIN HFA) 108 (90 BASE) MCG/ACT inhaler Inhale 2 puffs into the lungs every 6 (six) hours as needed for wheezing. 02/28/14  Yes Tiffany L Reed, DO  botulinum toxin Type A (BOTOX) 100 UNITS SOLR injection Inject 100 Units into the muscle once. As needed for migraine headaches   Yes Historical Provider, MD  butalbital-acetaminophen-caffeine (FIORICET, ESGIC) 50-325-40 MG per tablet Take 1-2 tablets by mouth every 6 (six) hours as needed. For neuropathy pain. 09/17/14  Yes Historical Provider, MD  clonazePAM (KLONOPIN) 0.5 MG tablet TAKE 1 TABLET BY MOUTH TWICE DAILY FOR BIPOLAR 03/04/14  Yes Kirt Boys, DO  FLUoxetine (PROZAC) 40 MG capsule Take 1 capsule (40 mg total) by mouth daily. Patient taking differently: Take 20 mg by mouth daily.  10/19/13  Yes Peyton Najjar, MD  Fluticasone-Salmeterol (ADVAIR) 100-50 MCG/DOSE AEPB Inhale 1 puff into the lungs 2 (two) times daily. 02/28/14  Yes Tiffany L Reed, DO  ibuprofen (ADVIL,MOTRIN) 200 MG tablet Take 600-800 mg by mouth every 6 (six) hours as needed (for pain.).    Yes Historical Provider, MD  lisdexamfetamine (VYVANSE) 40 MG  capsule Take 40 mg by mouth 2 (two) times daily.    Yes Historical Provider, MD  meloxicam (MOBIC) 15 MG tablet Take 15 mg by mouth daily. 10/19/14  Yes Historical Provider, MD  modafinil (PROVIGIL) 200 MG tablet Take 200 mg by mouth daily.   Yes Historical Provider, MD  ondansetron (ZOFRAN) 4 MG tablet Take 1 tablet (4 mg total) by mouth every 8 (eight) hours as needed for nausea or vomiting. 01/25/14  Yes Kirt Boys, DO  pantoprazole (PROTONIX) 40 MG tablet Take 1 tablet (40 mg total) by mouth daily  before breakfast. 10/06/13  Yes Rachael Fee, MD  risperiDONE (RISPERDAL) 0.5 MG tablet Take 3 tablets (1.5 mg total) by mouth 2 (two) times daily. Patient taking differently: Take 1 mg by mouth 2 (two) times daily.  01/23/14  Yes Kirt Boys, DO  rizatriptan (MAXALT) 10 MG tablet Take 1 tablet (10 mg total) by mouth as needed for migraine. May repeat in 2 hours if needed 03/04/14  Yes Kirt Boys, DO  topiramate (TOPAMAX) 25 MG tablet Take 75 mg by mouth daily.   Yes Historical Provider, MD  traZODone (DESYREL) 50 MG tablet Take 1 tablet (50 mg total) by mouth at bedtime as needed for sleep. 01/23/14  Yes Kirt Boys, DO  amantadine (SYMMETREL) 100 MG capsule Take 1 capsule (100 mg total) by mouth 2 (two) times daily with breakfast and lunch. Patient not taking: Reported on 10/29/2014 04/12/13   Beau Fanny, FNP  buPROPion [facility] SR) 150 MG 12 hr tablet Take 1 tablet (150 mg total) by mouth 2 (two) times daily. Patient not taking: Reported on 10/29/2014 01/23/14   Kirt Boys, DO  busPIRone (BUSPAR) 15 MG tablet Take 1 tablet (15 mg total) by mouth 2 (two) times daily. Patient not taking: Reported on 10/29/2014 10/19/13   Peyton Najjar, MD  desvenlafaxine (PRISTIQ) 50 MG 24 hr tablet Take one tablet by mouth once daily for depression Patient not taking: Reported on 10/29/2014 02/14/14   Kirt Boys, DO  gabapentin (NEURONTIN) 300 MG capsule Take 2 caps BID 1 cap at lunch Patient not taking: Reported on 10/29/2014 01/23/14   Kirt Boys, DO   BP 132/85 mmHg  Pulse 108  Temp(Src) 98.2 F (36.8 C) (Oral)  Resp 20  SpO2 95% Physical Exam  Constitutional: She is oriented to person, place, and time. She appears well-developed and well-nourished.  HENT:  Head: Normocephalic.  Mouth/Throat: Oropharynx is clear and moist.  Eyes: Conjunctivae are normal. Pupils are equal, round, and reactive to light.  Neck: Normal range of motion. Neck supple.  Cardiovascular: Normal rate, regular  rhythm and normal heart sounds.   Pulmonary/Chest: Effort normal and breath sounds normal. No respiratory distress. She has no wheezes. She has no rales.  Abdominal: Soft. Bowel sounds are normal. She exhibits no distension. There is no tenderness. There is no rebound.  Musculoskeletal: Normal range of motion. She exhibits no edema or tenderness.  Neurological: She is alert and oriented to person, place, and time. No cranial nerve deficit. Coordination normal.  Nl strength throughout. CN 2-12 intact.   Skin: Skin is warm and dry.  Psychiatric:  Poor judgment   Nursing note and vitals reviewed.   ED Course  Procedures (including critical care time) Labs Review Labs Reviewed  COMPREHENSIVE METABOLIC PANEL  ETHANOL  SALICYLATE LEVEL  ACETAMINOPHEN LEVEL  CBC  URINE RAPID DRUG SCREEN, HOSP PERFORMED    Imaging Review No results found. I have personally reviewed and  evaluated these images and lab results as part of my medical decision-making.   EKG Interpretation None      MDM   Final diagnoses:  None   Anne Black is a 54 y.o. female here with suicidal ideation. IVC by [employer]. Has MS but nl neuro exam, no signs of MS exacerbation. Will get labs, get TTS consult.   11:49 PM Labs pending. Signed out to Dr. Lynelle Doctor in the ED. If labs at baseline, can consult TTS for psych clearance. Patient also had more questions and requested that I speak to her again. Upon hearing that she will stay to get psych consult, she became agitated and started to talk about all the things she has to do at home. States that every time her family pulled out IVC papers on her, she gets admitted to psych facility. I told her that the psychiatrist usually decides who gets admitted to psych hospital. Her mood became more labile and began to be tearful. I filled out first exam for IVC. I told her firmly that she will need to be cleared by TTS before she can leave.     Richardean Canal, MD 10/29/14 2349  Richardean Canal, MD 10/30/14 [phone]

## 2014-10-30 ENCOUNTER — Inpatient Hospital Stay (HOSPITAL_COMMUNITY)
Admission: AD | Admit: 2014-10-30 | Discharge: 2014-11-05 | DRG: 885 | Disposition: A | Discharge status: Home / Self Care | Payer: Managed Care, Other (non HMO) | Source: Intra-hospital | Attending: Psychiatry | Admitting: Psychiatry

## 2014-10-30 DIAGNOSIS — F17210 Nicotine dependence, cigarettes, uncomplicated: Secondary | ICD-10-CM | POA: Diagnosis present

## 2014-10-30 DIAGNOSIS — F3164 Bipolar disorder, current episode mixed, severe, with psychotic features: Secondary | ICD-10-CM | POA: Diagnosis present

## 2014-10-30 DIAGNOSIS — Z23 Encounter for immunization: Secondary | ICD-10-CM | POA: Diagnosis not present

## 2014-10-30 DIAGNOSIS — F1020 Alcohol dependence, uncomplicated: Secondary | ICD-10-CM | POA: Diagnosis present

## 2014-10-30 DIAGNOSIS — F909 Attention-deficit hyperactivity disorder, unspecified type: Secondary | ICD-10-CM | POA: Diagnosis present

## 2014-10-30 DIAGNOSIS — G609 Hereditary and idiopathic neuropathy, unspecified: Secondary | ICD-10-CM | POA: Diagnosis present

## 2014-10-30 DIAGNOSIS — Z8249 Family history of ischemic heart disease and other diseases of the circulatory system: Secondary | ICD-10-CM

## 2014-10-30 DIAGNOSIS — K219 Gastro-esophageal reflux disease without esophagitis: Secondary | ICD-10-CM | POA: Diagnosis present

## 2014-10-30 DIAGNOSIS — F3111 Bipolar disorder, current episode manic without psychotic features, mild: Secondary | ICD-10-CM | POA: Diagnosis present

## 2014-10-30 DIAGNOSIS — F411 Generalized anxiety disorder: Secondary | ICD-10-CM | POA: Diagnosis present

## 2014-10-30 DIAGNOSIS — Z9119 Patient's noncompliance with other medical treatment and regimen: Secondary | ICD-10-CM | POA: Diagnosis present

## 2014-10-30 DIAGNOSIS — G43909 Migraine, unspecified, not intractable, without status migrainosus: Secondary | ICD-10-CM | POA: Diagnosis present

## 2014-10-30 DIAGNOSIS — F3110 Bipolar disorder, current episode manic without psychotic features, unspecified: Secondary | ICD-10-CM

## 2014-10-30 DIAGNOSIS — G4700 Insomnia, unspecified: Secondary | ICD-10-CM | POA: Diagnosis present

## 2014-10-30 DIAGNOSIS — M179 Osteoarthritis of knee, unspecified: Secondary | ICD-10-CM | POA: Diagnosis present

## 2014-10-30 DIAGNOSIS — E785 Hyperlipidemia, unspecified: Secondary | ICD-10-CM | POA: Insufficient documentation

## 2014-10-30 DIAGNOSIS — D643 Other sideroblastic anemias: Secondary | ICD-10-CM | POA: Diagnosis present

## 2014-10-30 DIAGNOSIS — M810 Age-related osteoporosis without current pathological fracture: Secondary | ICD-10-CM | POA: Diagnosis present

## 2014-10-30 DIAGNOSIS — J45909 Unspecified asthma, uncomplicated: Secondary | ICD-10-CM | POA: Diagnosis present

## 2014-10-30 DIAGNOSIS — G35 Multiple sclerosis: Secondary | ICD-10-CM | POA: Diagnosis present

## 2014-10-30 LAB — CBC
HEMATOCRIT: 42.6 % (ref 36.0–46.0)
HEMOGLOBIN: 14.7 g/dL (ref 12.0–15.0)
MCH: 34 pg (ref 26.0–34.0)
MCHC: 34.5 g/dL (ref 30.0–36.0)
MCV: 98.6 fL (ref 78.0–100.0)
Platelets: 268 10*3/uL (ref 150–400)
RBC: 4.32 MIL/uL (ref 3.87–5.11)
RDW: 12.8 % (ref 11.5–15.5)
WBC: 11.4 10*3/uL — ABNORMAL HIGH (ref 4.0–10.5)

## 2014-10-30 LAB — COMPREHENSIVE METABOLIC PANEL
ALBUMIN: 3.8 g/dL (ref 3.5–5.0)
ALK PHOS: 109 U/L (ref 38–126)
ALT: 52 U/L (ref 14–54)
AST: 60 U/L — AB (ref 15–41)
Anion gap: 10 (ref 5–15)
BUN: 5 mg/dL — ABNORMAL LOW (ref 6–20)
CALCIUM: 9 mg/dL (ref 8.9–10.3)
CHLORIDE: 99 mmol/L — AB (ref 101–111)
CO2: 27 mmol/L (ref 22–32)
CREATININE: 0.55 mg/dL (ref 0.44–1.00)
GFR calc Af Amer: >60 mL/min (ref >60)
GFR calc non Af Amer: >60 mL/min (ref >60)
GLUCOSE: 138 mg/dL — AB (ref 65–99)
Potassium: 3.1 mmol/L — ABNORMAL LOW (ref 3.5–5.1)
SODIUM: 136 mmol/L (ref 135–145)
Total Bilirubin: 0.3 mg/dL (ref 0.3–1.2)
Total Protein: 7.4 g/dL (ref 6.5–8.1)

## 2014-10-30 LAB — RAPID URINE DRUG SCREEN, HOSP PERFORMED
AMPHETAMINES: NOT DETECTED
BARBITURATES: NOT DETECTED
Benzodiazepines: NOT DETECTED
COCAINE: NOT DETECTED
Opiates: NOT DETECTED
TETRAHYDROCANNABINOL: NOT DETECTED

## 2014-10-30 LAB — ETHANOL: Alcohol, Ethyl (B): 14 mg/dL — ABNORMAL HIGH (ref <5)

## 2014-10-30 LAB — SALICYLATE LEVEL: Salicylate Lvl: <4 mg/dL (ref 2.8–30.0)

## 2014-10-30 LAB — ACETAMINOPHEN LEVEL: Acetaminophen (Tylenol), Serum: <10 ug/mL — ABNORMAL LOW (ref 10–30)

## 2014-10-30 MED ORDER — CLONAZEPAM 0.5 MG PO TABS
0.5000 mg | ORAL_TABLET | ORAL | Status: DC
  Administered 2014-10-31 (×2): 0.5 mg via ORAL
  Filled 2014-10-30 (×2): qty 1

## 2014-10-30 MED ORDER — POTASSIUM CHLORIDE CRYS ER 20 MEQ PO TBCR
40.0000 meq | EXTENDED_RELEASE_TABLET | Freq: Two times a day (BID) | ORAL | Status: DC
  Administered 2014-10-30: 20 meq via ORAL
  Administered 2014-10-30: 40 meq via ORAL
  Filled 2014-10-30 (×2): qty 2

## 2014-10-30 MED ORDER — ACETAMINOPHEN 325 MG PO TABS
650.0000 mg | ORAL_TABLET | ORAL | Status: DC | PRN
  Administered 2014-11-03: 650 mg via ORAL
  Filled 2014-10-30: qty 2

## 2014-10-30 MED ORDER — BUTALBITAL-APAP-CAFFEINE 50-325-40 MG PO TABS
1.0000 | ORAL_TABLET | Freq: Four times a day (QID) | ORAL | Status: DC | PRN
  Administered 2014-11-02 – 2014-11-05 (×3): 1 via ORAL
  Filled 2014-10-30 (×4): qty 1

## 2014-10-30 MED ORDER — IBUPROFEN 600 MG PO TABS
600.0000 mg | ORAL_TABLET | Freq: Three times a day (TID) | ORAL | Status: DC | PRN
  Administered 2014-11-04: 600 mg via ORAL
  Filled 2014-10-30: qty 1

## 2014-10-30 MED ORDER — LORAZEPAM 1 MG PO TABS
1.0000 mg | ORAL_TABLET | Freq: Three times a day (TID) | ORAL | Status: DC | PRN
  Administered 2014-10-30: 1 mg via ORAL
  Filled 2014-10-30 (×2): qty 1

## 2014-10-30 MED ORDER — TRAZODONE HCL 50 MG PO TABS
50.0000 mg | ORAL_TABLET | Freq: Every evening | ORAL | Status: DC | PRN
  Administered 2014-10-30: 50 mg via ORAL
  Filled 2014-10-30: qty 1

## 2014-10-30 MED ORDER — MODAFINIL 200 MG PO TABS
200.0000 mg | ORAL_TABLET | Freq: Every day | ORAL | Status: DC
  Administered 2014-10-30: 200 mg via ORAL
  Filled 2014-10-30: qty 1

## 2014-10-30 MED ORDER — LISDEXAMFETAMINE DIMESYLATE 20 MG PO CAPS
40.0000 mg | ORAL_CAPSULE | ORAL | Status: DC
  Administered 2014-10-30 (×2): 40 mg via ORAL
  Filled 2014-10-30 (×2): qty 2

## 2014-10-30 MED ORDER — MODAFINIL 200 MG PO TABS
200.0000 mg | ORAL_TABLET | Freq: Every day | ORAL | Status: DC
Start: 2014-10-31 — End: 2014-10-31
  Administered 2014-10-31: 200 mg via ORAL
  Filled 2014-10-30: qty 1

## 2014-10-30 MED ORDER — IBUPROFEN 200 MG PO TABS: 600.0000 mg | ORAL_TABLET | Freq: Three times a day (TID) | ORAL | Status: DC | PRN

## 2014-10-30 MED ORDER — TOPIRAMATE 25 MG PO TABS
75.0000 mg | ORAL_TABLET | Freq: Every day | ORAL | Status: DC
  Filled 2014-10-30: qty 3

## 2014-10-30 MED ORDER — CLONAZEPAM 0.5 MG PO TABS
0.5000 mg | ORAL_TABLET | Freq: Two times a day (BID) | ORAL | Status: DC
  Filled 2014-10-30: qty 1

## 2014-10-30 MED ORDER — CLONAZEPAM 0.5 MG PO TABS
0.5000 mg | ORAL_TABLET | ORAL | Status: DC
  Administered 2014-10-30 (×2): 0.5 mg via ORAL
  Filled 2014-10-30 (×2): qty 1

## 2014-10-30 MED ORDER — ACETAMINOPHEN 325 MG PO TABS: 650.0000 mg | ORAL_TABLET | ORAL | Status: DC | PRN

## 2014-10-30 MED ORDER — TOPIRAMATE 25 MG PO TABS
75.0000 mg | ORAL_TABLET | Freq: Every day | ORAL | Status: DC
  Administered 2014-10-31: 75 mg via ORAL
  Filled 2014-10-30 (×5): qty 3

## 2014-10-30 MED ORDER — POTASSIUM CHLORIDE CRYS ER 20 MEQ PO TBCR
40.0000 meq | EXTENDED_RELEASE_TABLET | Freq: Two times a day (BID) | ORAL | Status: AC
  Filled 2014-10-30 (×2): qty 2

## 2014-10-30 MED ORDER — LISDEXAMFETAMINE DIMESYLATE 20 MG PO CAPS
40.0000 mg | ORAL_CAPSULE | Freq: Two times a day (BID) | ORAL | Status: DC
Start: 2014-10-30 — End: 2014-10-30
  Filled 2014-10-30: qty 2

## 2014-10-30 MED ORDER — TRAZODONE HCL 50 MG PO TABS
50.0000 mg | ORAL_TABLET | Freq: Every evening | ORAL | Status: DC | PRN
  Administered 2014-10-30 – 2014-10-31 (×2): 50 mg via ORAL
  Filled 2014-10-30 (×2): qty 1

## 2014-10-30 MED ORDER — LISDEXAMFETAMINE DIMESYLATE 20 MG PO CAPS
40.0000 mg | ORAL_CAPSULE | ORAL | Status: DC
  Administered 2014-10-31 (×2): 40 mg via ORAL
  Filled 2014-10-30 (×2): qty 2

## 2014-10-30 MED ORDER — BUTALBITAL-APAP-CAFFEINE 50-325-40 MG PO TABS: 1.0000 | ORAL_TABLET | Freq: Four times a day (QID) | ORAL | Status: DC | PRN

## 2014-10-30 NOTE — ED Notes (Addendum)
Report called to RN [facility], pending [employer] transfer after 9pm.

## 2014-10-30 NOTE — BH Assessment (Signed)
[facility] Assessment Progress Note  Per Thedore Mins, MD, this pt requires psychiatric hospitalization at this time.  Pt presents under IVC initiated by her ex-husband, Claudie Leach, and upheld by EDP Chaney Malling, MD.  Rosey Bath, RN, [facility], has assigned pt to [facility] Rm 405-1 with a caveat that pt is not to be transported until 20:00.  IVC paperwork has been faxed to [facility].  Pt's nurse, Jan, has been notified and agrees to call report to [phone].  Pt is to be transported via [facility].  Doylene Canning, MA Triage Specialist [phone]

## 2014-10-30 NOTE — ED Notes (Signed)
Pt resting at present, no distress noted, uncooperative but calm. Pending report to [facility] and [facility] transfer after 8pm.  Monitoring for safety, Q 15 min checks in effect.

## 2014-10-30 NOTE — ED Notes (Signed)
Phlebotomist came to get BMP and pt refused.

## 2014-10-30 NOTE — Clinical Social Work Note (Signed)
CSW called and left a message on pt's ex husbands phone.  Pt's ex husband is the one who took IVC paperwork out on pt and CSW attempted to obtain additional information. A phone number was left for him to provide a return call.  Elray Buba, LCSW [facility] Triage

## 2014-10-30 NOTE — ED Notes (Signed)
Pt very argumentative, demanding an MRI, explained to pt that she will see the Psychiatrist at [facility] who will determine what meds and tests are to be ordered.

## 2014-10-30 NOTE — Consult Note (Signed)
[facility] Consult   Reason for Consult:  Bipolar disorder, Manic, Alcohol use disorder, severe dependence Referring Physician:  EDP [name] MRN:  [id number] Principal Diagnosis: Bipolar I disorder, most recent episode (or current) manic Diagnosis:   Patient Active Problem List   Diagnosis Date Noted  . Alcohol use disorder, moderate, dependence [F10.20] 10/30/2014  . Generalized anxiety disorder [F41.1] 01/23/2014  . Tobacco abuse [Z72.0] 01/23/2014  . Bipolar I disorder, most recent episode (or current) manic [F31.10] 04/08/2013  . Noncompliance with therapeutic plan [Z91.11] 04/04/2013  . Unspecified hereditary and idiopathic peripheral neuropathy [G60.9] 05/01/2012  . GERD (gastroesophageal reflux disease) [K21.9] 05/01/2012  . Osteoarthrosis, unspecified whether generalized or localized, involving lower leg [M17.9] 05/01/2012  . MS (multiple sclerosis) [G35] 09/14/2011  . Anemia [D64.9] 09/14/2011    Total Time spent with patient: 1 hour  Subjective:   Anne Black is a 54 y.o. female patient admitted with Bipolar disorder, Manic, Alcohol use disorder, severe dependence   HPI:  Caucasian female, 53 years old was evaluated for suicidal ideation.  Patient was IVC by her ex-husband which she states she does not know the reason for her IVC.   Patient became suicidal after her PMD refused to prescribe her medications for ADHD. Patient reports that she has not been compliant with her medications and treatment.  She reports that she does not have a Provider for her mental illness and MS.  Patient was brought in with Alcohol in the system but vehemently denied previous Alcohol problem.  Patient later during the assessment stated that she has had DUI X 4.   She admits that her 5 daughters insults her due to her Alcohol use.   She reports that she does not see a neurologist and have not taken any medication for MS for a while.  She reports  previous diagnosis of Depression and anxiety.  She has seen   Dr Darrold Span once a long time  ago during a divorce and custody battle.  She has not seen a Psychiatrist in a long time.  Patient reports good sleep and appetite.  Patient denies SI/HI/AVH today.  She has been accepted for admission and we have a bed assigned to her.    HPI Elements:   Location:  Bipolar disorder, manic, Alcohol use disorder, severe, . Quality:  severe. Severity:  severe. Timing:  acute. Duration:  Chronic mental illness. Context:  IVC by her ex-husband  for suicidal ideation.  Past Medical History:  Past Medical History  Diagnosis Date  . Multiple sclerosis   . Arthritis   . Anxiety   . Bipolar disorder   . ADHD (attention deficit hyperactivity disorder)   . Migraine   . Osteoporosis     Past Surgical History  Procedure Laterality Date  . Cesarean section  [id number]  . Myringotomy with tube placement Bilateral   . Tonsillectomy     Family History:  Family History  Problem Relation Age of Onset  . Heart disease Mother    Social History:  History  Alcohol Use  . 4.4 oz/week  . 4 Standard drinks or equivalent, 2 Glasses of wine, 2 Cans of beer per week    Comment: pt states she drinks every 2-3 days     History  Drug Use No    Social History   Social History  . Marital Status: Divorced    Spouse Name: N/A  . Number of Children: 5  . Years of Education:  hs   Occupational History  .      on social security disability for MS   Social History Main Topics  . Smoking status: Current Every Day Smoker -- 1.50 packs/day for 20 years    Types: Cigarettes  . Smokeless tobacco: Never Used  . Alcohol Use: 4.4 oz/week    4 Standard drinks or equivalent, 2 Glasses of wine, 2 Cans of beer per week     Comment: pt states she drinks every 2-3 days  . Drug Use: No  . Sexual Activity: Not Currently   Other Topics Concern  . None   Social History Narrative   Additional Social History:                           Allergies:  No Known Allergies  Labs:  Results for orders placed or performed during the hospital encounter of 10/29/14 (from the past 48 hour(s))  Comprehensive metabolic panel     Status: Abnormal   Collection Time: 10/29/14 11:40 PM  Result Value Ref Range   Sodium 136 135 - 145 mmol/L   Potassium 3.1 (L) 3.5 - 5.1 mmol/L   Chloride 99 (L) 101 - 111 mmol/L   CO2 27 22 - 32 mmol/L   Glucose, Bld 138 (H) 65 - 99 mg/dL   BUN 5 (L) 6 - 20 mg/dL   Creatinine, Ser 0.55 0.44 - 1.00 mg/dL   Calcium 9.0 8.9 - 10.3 mg/dL   Total Protein 7.4 6.5 - 8.1 g/dL   Albumin 3.8 3.5 - 5.0 g/dL   AST 60 (H) 15 - 41 U/L   ALT 52 14 - 54 U/L   Alkaline Phosphatase 109 38 - 126 U/L   Total Bilirubin 0.3 0.3 - 1.2 mg/dL   GFR calc non Af Amer >60 >60 mL/min   GFR calc Af Amer >60 >60 mL/min    Comment: (NOTE) The eGFR has been calculated using the CKD EPI equation. This calculation has not been validated in all clinical situations. eGFR's persistently <60 mL/min signify possible Chronic Kidney Disease.    Anion gap 10 5 - 15  Ethanol (ETOH)     Status: Abnormal   Collection Time: 10/29/14 11:40 PM  Result Value Ref Range   Alcohol, Ethyl (B) 14 (H) <5 mg/dL    Comment:        LOWEST DETECTABLE LIMIT FOR SERUM ALCOHOL IS 5 mg/dL FOR MEDICAL PURPOSES ONLY   Salicylate level     Status: None   Collection Time: 10/29/14 11:40 PM  Result Value Ref Range   Salicylate Lvl <2.4 2.8 - 30.0 mg/dL  Acetaminophen level     Status: Abnormal   Collection Time: 10/29/14 11:40 PM  Result Value Ref Range   Acetaminophen (Tylenol), Serum <10 (L) 10 - 30 ug/mL    Comment:        THERAPEUTIC CONCENTRATIONS VARY SIGNIFICANTLY. A RANGE OF 10-30 ug/mL MAY BE AN EFFECTIVE CONCENTRATION FOR MANY PATIENTS. HOWEVER, SOME ARE BEST TREATED AT CONCENTRATIONS OUTSIDE THIS RANGE. ACETAMINOPHEN CONCENTRATIONS >150 ug/mL AT 4 HOURS AFTER INGESTION AND >50 ug/mL AT 12 HOURS AFTER  INGESTION ARE OFTEN ASSOCIATED WITH TOXIC REACTIONS.   CBC     Status: Abnormal   Collection Time: 10/29/14 11:40 PM  Result Value Ref Range   WBC 11.4 (H) 4.0 - 10.5 K/uL   RBC 4.32 3.87 - 5.11 MIL/uL   Hemoglobin 14.7 12.0 - 15.0 g/dL   HCT 42.6  36.0 - 46.0 %   MCV 98.6 78.0 - 100.0 fL   MCH 34.0 26.0 - 34.0 pg   MCHC 34.5 30.0 - 36.0 g/dL   RDW 12.8 11.5 - 15.5 %   Platelets 268 150 - 400 K/uL  Urine rapid drug screen (hosp performed) (Not at [facility])     Status: None   Collection Time: 10/29/14 11:45 PM  Result Value Ref Range   Opiates NONE DETECTED NONE DETECTED   Cocaine NONE DETECTED NONE DETECTED   Benzodiazepines NONE DETECTED NONE DETECTED   Amphetamines NONE DETECTED NONE DETECTED   Tetrahydrocannabinol NONE DETECTED NONE DETECTED   Barbiturates NONE DETECTED NONE DETECTED    Comment:        DRUG SCREEN FOR MEDICAL PURPOSES ONLY.  IF CONFIRMATION IS NEEDED FOR ANY PURPOSE, NOTIFY LAB WITHIN 5 DAYS.        LOWEST DETECTABLE LIMITS FOR URINE DRUG SCREEN Drug Class       Cutoff (ng/mL) Amphetamine      1000 Barbiturate      200 Benzodiazepine   423 Tricyclics       953 Opiates          300 Cocaine          300 THC              50     Vitals: Blood pressure 134/69, pulse 87, temperature 98.6 F (37 C), temperature source Oral, resp. rate 24, SpO2 94 %.  Risk to Self: Suicidal Ideation: Yes-Currently Present Suicidal Intent: Yes-Currently Present Is patient at risk for suicide?: Yes Suicidal Plan?: No Access to Means: Yes Specify Access to Suicidal Means:  (has prescriptions at home) What has been your use of drugs/alcohol within the last 12 months?:  (4 drinks or 2 beers every 2-3 days) How many times?:  (none) Other Self Harm Risks:  (none reported) Triggers for Past Attempts:  (n/a) Intentional Self Injurious Behavior: None Risk to Others: Homicidal Ideation: No Thoughts of Harm to Others: No Current Homicidal Intent: No Current Homicidal Plan:  No Access to Homicidal Means:  (n/a) Identified Victim:  (n/a) History of harm to others?: No Assessment of Violence: None Noted Violent Behavior Description:  (none) Does patient have access to weapons?: No Criminal Charges Pending?: Yes Describe Pending Criminal Charges:  (DWI and driving without a license) Does patient have a court date: Yes Court Date: 10/30/14 Prior Inpatient Therapy: Prior Inpatient Therapy: Yes Prior Therapy Dates:  (2015, 2014, 2013 and maybe more) Prior Therapy Facilty/Provider(s):  ([facility]) Reason for Treatment:  (danger to herself) Prior Outpatient Therapy: Prior Outpatient Therapy: Yes Prior Therapy Dates:  (2004) Prior Therapy Facilty/Provider(s):  (does not remember) Reason for Treatment:  (depression and anxiety) Does patient have an ACCT team?: No Does patient have Intensive In-House Services?  : No Does patient have [name] services? : No Does patient have P4CC services?: No  Current Facility-Administered Medications  Medication Dose Route Frequency Provider Last Rate Last Dose  . acetaminophen (TYLENOL) tablet 650 mg  650 mg Oral Q4H PRN Wandra Arthurs, MD      . butalbital-acetaminophen-caffeine [facility], ESGIC) [phone] MG per tablet 1 tablet  1 tablet Oral Q6H PRN Wandra Arthurs, MD      . clonazePAM Bobbye [place]) tablet 0.5 mg  0.5 mg Oral 2 times per day Davonna Belling, MD   0.5 mg at 10/30/14 1225  . ibuprofen (ADVIL,MOTRIN) tablet 600 mg  600 mg Oral Q8H PRN Fenton Malling  Darl Householder, MD      . lisdexamfetamine (VYVANSE) capsule 40 mg  40 mg Oral 2 times per day Davonna Belling, MD   40 mg at 10/30/14 1221  . LORazepam (ATIVAN) tablet 1 mg  1 mg Oral Q8H PRN Wandra Arthurs, MD   1 mg at 10/30/14 0104  . modafinil (PROVIGIL) tablet 200 mg  200 mg Oral Daily Wandra Arthurs, MD   200 mg at 10/30/14 1225  . potassium chloride SA (K-DUR,KLOR-CON) CR tablet 40 mEq  40 mEq Oral BID Rolland Porter, MD   20 mEq at 10/30/14 1226  . topiramate (TOPAMAX) tablet 75 mg  75  mg Oral Daily Wandra Arthurs, MD   75 mg at 10/30/14 1226  . traZODone (DESYREL) tablet 50 mg  50 mg Oral QHS PRN Wandra Arthurs, MD   50 mg at 10/30/14 0104   Current Outpatient Prescriptions  Medication Sig Dispense Refill  . albuterol (PROVENTIL HFA;VENTOLIN HFA) 108 (90 BASE) MCG/ACT inhaler Inhale 2 puffs into the lungs every 6 (six) hours as needed for wheezing. 1 Inhaler 0  . botulinum toxin Type A (BOTOX) 100 UNITS SOLR injection Inject 100 Units into the muscle once. As needed for migraine headaches    . butalbital-acetaminophen-caffeine (FIORICET, ESGIC) 50-325-40 MG per tablet Take 1-2 tablets by mouth every 6 (six) hours as needed. For neuropathy pain.  1  . clonazePAM (KLONOPIN) 0.5 MG tablet TAKE 1 TABLET BY MOUTH TWICE DAILY FOR BIPOLAR 60 tablet 0  . FLUoxetine (PROZAC) 40 MG capsule Take 1 capsule (40 mg total) by mouth daily. (Patient taking differently: Take 20 mg by mouth daily. ) 90 capsule 31  . Fluticasone-Salmeterol (ADVAIR) 100-50 MCG/DOSE AEPB Inhale 1 puff into the lungs 2 (two) times daily. 60 each 0  . ibuprofen (ADVIL,MOTRIN) 200 MG tablet Take 600-800 mg by mouth every 6 (six) hours as needed (for pain.).     Marland Kitchen lisdexamfetamine (VYVANSE) 40 MG capsule Take 40 mg by mouth 2 (two) times daily.     . meloxicam (MOBIC) 15 MG tablet Take 15 mg by mouth daily.  2  . modafinil (PROVIGIL) 200 MG tablet Take 200 mg by mouth daily.    . ondansetron (ZOFRAN) 4 MG tablet Take 1 tablet (4 mg total) by mouth every 8 (eight) hours as needed for nausea or vomiting. 20 tablet 0  . pantoprazole (PROTONIX) 40 MG tablet Take 1 tablet (40 mg total) by mouth daily before breakfast. 30 tablet 1  . risperiDONE (RISPERDAL) 0.5 MG tablet Take 3 tablets (1.5 mg total) by mouth 2 (two) times daily. (Patient taking differently: Take 1 mg by mouth 2 (two) times daily. ) 180 tablet 1  . rizatriptan (MAXALT) 10 MG tablet Take 1 tablet (10 mg total) by mouth as needed for migraine. May repeat in 2 hours if  needed 10 tablet 0  . topiramate (TOPAMAX) 25 MG tablet Take 75 mg by mouth daily.    . traZODone (DESYREL) 50 MG tablet Take 1 tablet (50 mg total) by mouth at bedtime as needed for sleep. 30 tablet 1  . amantadine (SYMMETREL) 100 MG capsule Take 1 capsule (100 mg total) by mouth 2 (two) times daily with breakfast and lunch. (Patient not taking: Reported on 10/29/2014) 60 capsule 0  . buPROPion (WELLBUTRIN SR) 150 MG 12 hr tablet Take 1 tablet (150 mg total) by mouth 2 (two) times daily. (Patient not taking: Reported on 10/29/2014) 60 tablet 1  . busPIRone (BUSPAR) 15  MG tablet Take 1 tablet (15 mg total) by mouth 2 (two) times daily. (Patient not taking: Reported on 10/29/2014) 60 tablet 1  . desvenlafaxine (PRISTIQ) 50 MG 24 hr tablet Take one tablet by mouth once daily for depression (Patient not taking: Reported on 10/29/2014) 30 tablet 3  . gabapentin (NEURONTIN) 300 MG capsule Take 2 caps BID 1 cap at lunch (Patient not taking: Reported on 10/29/2014) 150 capsule 3    Musculoskeletal: Strength & Muscle Tone: within normal limits Gait & Station: normal Patient leans: N/A  Psychiatric Specialty Exam: Physical Exam  Review of Systems  Constitutional: Negative.   HENT: Negative.   Eyes: Negative.   Cardiovascular: Negative.   Gastrointestinal: Negative.   Genitourinary: Negative.   Musculoskeletal: Negative.   Skin: Negative.   Neurological:       Hx of MS    Blood pressure 134/69, pulse 87, temperature 98.6 F (37 C), temperature source Oral, resp. rate 24, SpO2 94 %.There is no weight on file to calculate BMI.  General Appearance: Casual and Disheveled  Eye Contact::  Good  Speech:  Clear and Coherent and Pressured  Volume:  Normal  Mood:  Angry, Anxious and Irritable  Affect:  Congruent  Thought Process:  Circumstantial and Tangential  Orientation:  Full (Time, Place, and Person)  Thought Content:  WDL  Suicidal Thoughts:  No  Homicidal Thoughts:  No  Memory:  Immediate;    Poor Recent;   Poor Remote;   Poor  Judgement:  Impaired  Insight:  Shallow  Psychomotor Activity:  Psychomotor Retardation  Concentration:  Poor  Recall:  Poor  Fund of Knowledge:Poor  Language: Fair  Akathisia:  NA  Handed:  Right  AIMS (if indicated):     Assets:  Desire for Improvement  ADL's:  Intact  Cognition: WNL  Sleep:      Medical Decision Making: Review of Psycho-Social Stressors (1), Established Problem, Worsening (2), Review of Medication Regimen & Side Effects (2) and Review of New Medication or Change in Dosage (2)  Treatment Plan Summary: Daily contact with patient to assess and evaluate symptoms and progress in treatment and Medication management  Plan:  Resume home medications. Disposition:  Admitted with bed assigned  Delfin Gant   PMHNP-BC 10/30/2014 2:37 PM Patient seen face-to-face for psychiatric evaluation, chart reviewed and case discussed with the physician extender and developed treatment plan. Reviewed the information documented and agree with the treatment plan. Corena Pilgrim, MD

## 2014-10-30 NOTE — ED Notes (Signed)
[facility] transport requested. 

## 2014-10-30 NOTE — ED Provider Notes (Signed)
Pt left at change of shift to check labs because a prior history of hyponatremia. Patient was started on potassium supplement.  Results for orders placed or performed during the hospital encounter of 10/29/14  Comprehensive metabolic panel  Result Value Ref Range   Sodium 136 135 - 145 mmol/L   Potassium 3.1 (L) 3.5 - 5.1 mmol/L   Chloride 99 (L) 101 - 111 mmol/L   CO2 27 22 - 32 mmol/L   Glucose, Bld 138 (H) 65 - 99 mg/dL   BUN 5 (L) 6 - 20 mg/dL   Creatinine, Ser 3.61 0.44 - 1.00 mg/dL   Calcium 9.0 8.9 - 44.3 mg/dL   Total Protein 7.4 6.5 - 8.1 g/dL   Albumin 3.8 3.5 - 5.0 g/dL   AST 60 (H) 15 - 41 U/L   ALT 52 14 - 54 U/L   Alkaline Phosphatase 109 38 - 126 U/L   Total Bilirubin 0.3 0.3 - 1.2 mg/dL   GFR calc non Af Amer >60 >60 mL/min   GFR calc Af Amer >60 >60 mL/min   Anion gap 10 5 - 15  Ethanol (ETOH)  Result Value Ref Range   Alcohol, Ethyl (B) 14 (H) <5 mg/dL  Salicylate level  Result Value Ref Range   Salicylate Lvl <4.0 2.8 - 30.0 mg/dL  Acetaminophen level  Result Value Ref Range   Acetaminophen (Tylenol), Serum <10 (L) 10 - 30 ug/mL  CBC  Result Value Ref Range   WBC 11.4 (H) 4.0 - 10.5 K/uL   RBC 4.32 3.87 - 5.11 MIL/uL   Hemoglobin 14.7 12.0 - 15.0 g/dL   HCT 15.4 00.8 - 67.6 %   MCV 98.6 78.0 - 100.0 fL   MCH 34.0 26.0 - 34.0 pg   MCHC 34.5 30.0 - 36.0 g/dL   RDW 19.5 09.3 - 26.7 %   Platelets 268 150 - 400 K/uL  Urine rapid drug screen (hosp performed) (Not at [facility])  Result Value Ref Range   Opiates NONE DETECTED NONE DETECTED   Cocaine NONE DETECTED NONE DETECTED   Benzodiazepines NONE DETECTED NONE DETECTED   Amphetamines NONE DETECTED NONE DETECTED   Tetrahydrocannabinol NONE DETECTED NONE DETECTED   Barbiturates NONE DETECTED NONE DETECTED   Laboratory interpretation all normal except hypokalemia, leukocytosis  Devoria Albe, MD, Concha Pyo, MD 10/30/14 [phone]

## 2014-10-30 NOTE — BH Assessment (Signed)
Spoke with Tresa Endo RN who reports pt was given sleep medications and can not be assessed at this time. TTS consult to be removed and ordered when pt is alert.    Clista Bernhardt, [facility] Triage Specialist 10/30/2014 3:47 AM

## 2014-10-30 NOTE — BH Assessment (Addendum)
Reviewed ED notes prior to initiating assessment. Pt with known hx of bipolar brought to ED under IVC with SI and recent aggression towards family members.   Requested cart be placed with pt for assessment. Requested IVC paperwork be faxed to this writer.   Assessment to commence shortly.   First attempt to call cart, [phone] no answer, will try again in five minutes to allow time for cart to be placed.   [phone] 2nd attempt, no answer.   Clista Bernhardt, [facility] Triage Specialist 10/30/2014 3:28 AM

## 2014-10-30 NOTE — Tx Team (Signed)
Initial Interdisciplinary Treatment Plan   PATIENT STRESSORS: Financial difficulties Health problems Legal issue Marital or family conflict Substance abuse   PATIENT STRENGTHS: Ability for insight Average or above average intelligence Capable of independent living General fund of knowledge Motivation for treatment/growth   PROBLEM LIST: Problem List/Patient Goals Date to be addressed Date deferred Reason deferred Estimated date of resolution  "To get my grass cut and seeded." 10/30/2014     "To get a mammogram." 10/30/2014     Anxiety 10/30/2014     Depression 10/30/2014     Suicide Risk 10/30/2014                              DISCHARGE CRITERIA:  Ability to meet basic life and health needs Improved stabilization in mood, thinking, and/or behavior Need for constant or close observation no longer present Safe-care adequate arrangements made  PRELIMINARY DISCHARGE PLAN: Attend aftercare/continuing care group Outpatient therapy Return to previous living arrangement  PATIENT/FAMIILY INVOLVEMENT: This treatment plan has been presented to and reviewed with the patient, ELAISHA BEESON.  The patient and family have been given the opportunity to ask questions and make suggestions.  Angeline Slim M 10/31/2014, 12:31 AM

## 2014-10-30 NOTE — ED Notes (Signed)
Pt is requesting to speak with nursing supervisor. NP supervisor notified.

## 2014-10-30 NOTE — ED Notes (Signed)
Bed: WA31 Expected date:  Expected time:  Means of arrival:  Comments: 

## 2014-10-30 NOTE — Progress Notes (Signed)
Pt mother called to provide collateral information. Pt mother states she knows she was brought in under ivc and is still in the [facility] ED.  Pt mother did not request any information as she knows patient will not sign consent to release information. Per mother, pt has cut off ties with mother for the past year and a half however pt mother is updated by pt adult children. Per mother, pt does not receive any outpatient care as patient has been dismissed from several medical providers including urgent cares. Pt mother shares the family has concern of alcohol use. Pt family attempted to petition for guardianship 2-3 years ago, however it was denied. Pt mother does not wish for pt to know that she called to provide information as it will make patient upset.   Olga Coaster, LCSW  Clinical Social Work  [employer] [phone]

## 2014-10-30 NOTE — ED Notes (Signed)
Phlebotomist notified for BMP
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
## 2014-10-30 NOTE — ED Notes (Signed)
Pt's mood is irritable and labile. Pt reports that no one listens and when writer tried to talk with pt and asked a question, she became irritable saying that she had already talked with the other nurse. Pt is requesting an MRI, mammogram, and saying that she did not take three pills this morning. She was unable to name the pills.

## 2014-10-31 ENCOUNTER — Encounter (HOSPITAL_COMMUNITY): Payer: Self-pay | Admitting: Behavioral Health

## 2014-10-31 DIAGNOSIS — F3110 Bipolar disorder, current episode manic without psychotic features, unspecified: Secondary | ICD-10-CM | POA: Diagnosis not present

## 2014-10-31 DIAGNOSIS — F3160 Bipolar disorder, current episode mixed, unspecified: Secondary | ICD-10-CM

## 2014-10-31 MED ORDER — RISPERIDONE 1 MG PO TABS
1.0000 mg | ORAL_TABLET | Freq: Two times a day (BID) | ORAL | Status: DC
  Administered 2014-11-01: 1 mg via ORAL
  Filled 2014-10-31 (×4): qty 1

## 2014-10-31 MED ORDER — HYDROXYZINE HCL 50 MG PO TABS
50.0000 mg | ORAL_TABLET | Freq: Three times a day (TID) | ORAL | Status: DC | PRN
  Administered 2014-10-31 – 2014-11-03 (×3): 50 mg via ORAL
  Filled 2014-10-31 (×3): qty 1

## 2014-10-31 MED ORDER — ALBUTEROL SULFATE HFA 108 (90 BASE) MCG/ACT IN AERS
INHALATION_SPRAY | RESPIRATORY_TRACT | Status: AC
Start: 2014-10-31 — End: 2014-10-31
  Filled 2014-10-31: qty 6.7

## 2014-10-31 MED ORDER — CLOTRIMAZOLE 1 % EX CREA
TOPICAL_CREAM | Freq: Two times a day (BID) | CUTANEOUS | Status: DC
  Administered 2014-10-31 – 2014-11-05 (×3): via TOPICAL
  Filled 2014-10-31 (×2): qty 15

## 2014-10-31 MED ORDER — PANTOPRAZOLE SODIUM 40 MG IV SOLR: 40.0000 mg | Freq: Two times a day (BID) | INTRAVENOUS | Status: DC

## 2014-10-31 MED ORDER — CLONAZEPAM 0.5 MG PO TABS
0.5000 mg | ORAL_TABLET | Freq: Three times a day (TID) | ORAL | Status: DC
  Administered 2014-11-01: 0.5 mg via ORAL
  Filled 2014-10-31: qty 1

## 2014-10-31 MED ORDER — PANTOPRAZOLE SODIUM 40 MG PO TBEC
40.0000 mg | DELAYED_RELEASE_TABLET | Freq: Two times a day (BID) | ORAL | Status: DC
  Administered 2014-10-31 – 2014-11-05 (×10): 40 mg via ORAL
  Filled 2014-10-31 (×16): qty 1

## 2014-10-31 MED ORDER — TRAZODONE HCL 100 MG PO TABS
100.0000 mg | ORAL_TABLET | Freq: Every evening | ORAL | Status: DC | PRN
  Administered 2014-10-31 – 2014-11-04 (×5): 100 mg via ORAL
  Filled 2014-10-31 (×5): qty 1

## 2014-10-31 MED ORDER — MELOXICAM 7.5 MG PO TABS
7.5000 mg | ORAL_TABLET | Freq: Every day | ORAL | Status: DC
  Administered 2014-10-31 – 2014-11-05 (×6): 7.5 mg via ORAL
  Filled 2014-10-31 (×9): qty 1

## 2014-10-31 MED ORDER — NICOTINE 21 MG/24HR TD PT24
21.0000 mg | MEDICATED_PATCH | Freq: Every day | TRANSDERMAL | Status: DC
  Administered 2014-10-31 – 2014-11-05 (×6): 21 mg via TRANSDERMAL
  Filled 2014-10-31 (×9): qty 1

## 2014-10-31 MED ORDER — MOMETASONE FURO-FORMOTEROL FUM 100-5 MCG/ACT IN AERO
2.0000 | INHALATION_SPRAY | Freq: Two times a day (BID) | RESPIRATORY_TRACT | Status: DC
  Administered 2014-10-31 – 2014-11-05 (×10): 2 via RESPIRATORY_TRACT
  Filled 2014-10-31 (×2): qty 8.8

## 2014-10-31 MED ORDER — ALBUTEROL SULFATE HFA 108 (90 BASE) MCG/ACT IN AERS
2.0000 | INHALATION_SPRAY | Freq: Four times a day (QID) | RESPIRATORY_TRACT | Status: DC | PRN
  Administered 2014-10-31 – 2014-11-04 (×3): 2 via RESPIRATORY_TRACT
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
## 2014-10-31 NOTE — Tx Team (Signed)
Interdisciplinary Treatment Plan Update (Adult) Date: 10/31/2014   Date: 10/31/2014 2:13 PM  Progress in Treatment:  Attending groups: Anne Black is new to milieu, continuing to assess  Participating in groups: Anne Black is new to milieu, continuing to assess  Taking medication as prescribed: Yes  Tolerating medication: Yes  Family/Significant othe contact made: No, CSW assessing for appropriate contact Patient understands diagnosis: Continuing to assess Discussing patient identified problems/goals with staff: Yes  Medical problems stabilized or resolved: Yes  Denies suicidal/homicidal ideation: Yes Patient has not harmed self or Others: Yes   New problem(s) identified: None identified at this time.   Discharge Plan or Barriers: CSW will assess for appropriate discharge plan and relevant barriers.   Additional comments: n/a   Reason for Continuation of Hospitalization:  Mania Medication stabilization Suicidal ideation  Estimated length of stay: 3-5 days  Review of initial/current patient goals per problem list:   1.  Goal(s): Patient will participate in aftercare plan  Met:  No  Target date: 3-5 days from date of admission   As evidenced by: Patient will participate within aftercare plan AEB aftercare [name] and housing plan at discharge being identified.  10/31/14: CSW to work with Anne Black to assess for appropriate discharge plan and faciliate appointments and referrals as needed prior to d/c. 6. Goal (s):  Patient will demonstrate decreased signs of mania . Met:  No . Target date: 3-5 days from date of admission  . As evidenced by:  Patient demonstrate decreased signs of mania AEB decreased mood instability and demonstration of stable mood    10/31/14: Anne Black continues to have flight of ideas, impulsive actions, intrusive behaviors, and racing    thoughts.  Attendees:  Patient:    Family:    Physician: Dr. Parke Poisson, MD  10/31/2014 2:13 PM  Nursing: Lars Pinks, RN Case manager  10/31/2014  2:13 PM  Clinical Social Worker Norman Clay, MSW 10/31/2014 2:13 PM  Other: Jake Bathe Liasion 10/31/2014 2:13 PM  Clinical:  RN 10/31/2014 2:13 PM  Other: Vira Browns, RN Charge Nurse 10/31/2014 2:13 PM  Other:     Peri Maris, [place] MSW

## 2014-10-31 NOTE — BHH Suicide Risk Assessment (Signed)
[facility] Admission Suicide Risk Assessment   Nursing information obtained from:   patient/ chart  Demographic factors:   54 year old female, divorced, on disability, lives alone Current Mental Status:   see below  Loss Factors:   limited support network, chronic medical illnesses ( MS ) , disability Historical Factors:   bipolar disorder, history of alcohol abuse  Risk Reduction Factors:   resilience  Total Time spent with patient: 45 minutes Principal Problem: Bipolar Disorder , Mixed  Diagnosis:   Patient Active Problem List   Diagnosis Date Noted  . Alcohol use disorder, moderate, dependence [F10.20] 10/30/2014  . Bipolar 1 disorder, manic, mild [F31.11] 10/30/2014  . Generalized anxiety disorder [F41.1] 01/23/2014  . Tobacco abuse [Z72.0] 01/23/2014  . Bipolar I disorder, most recent episode (or current) manic [F31.10] 04/08/2013  . Noncompliance with therapeutic plan [Z91.11] 04/04/2013  . Unspecified hereditary and idiopathic peripheral neuropathy [G60.9] 05/01/2012  . GERD (gastroesophageal reflux disease) [K21.9] 05/01/2012  . Osteoarthrosis, unspecified whether generalized or localized, involving lower leg [M17.9] 05/01/2012  . MS (multiple sclerosis) [G35] 09/14/2011  . Anemia [D64.9] 09/14/2011     Continued Clinical Symptoms:  Alcohol Use Disorder Identification Test Final Score (AUDIT): 7 The "Alcohol Use Disorders Identification Test", Guidelines for Use in Primary Care, Second Edition.  World Science writer [facility]). Score between 0-7:  no or low risk or alcohol related problems. Score between 8-15:  moderate risk of alcohol related problems. Score between 16-19:  high risk of alcohol related problems. Score 20 or above:  warrants further diagnostic evaluation for alcohol dependence and treatment.   CLINICAL FACTORS:  54 year old female, history of bipolar disorder, committed by family due to reported suicidal threats and increasing anger and hostility, patient  denies most of these reports, but does endorse depression. Presents intermittently loud, labile , irritable .     Psychiatric Specialty Exam: Physical Exam  ROS  Blood pressure 103/82, pulse 87, temperature 98.3 F (36.8 C), temperature source Oral, resp. rate 20, height 5\' 3"  (1.6 m), weight 205 lb (92.987 kg), SpO2 99 %.Body mass index is 36.32 kg/(m^2).   see admit note MSE   COGNITIVE FEATURES THAT CONTRIBUTE TO RISK:  Closed-mindedness and Loss of executive function    SUICIDE RISK:   Moderate:  Frequent suicidal ideation with limited intensity, and duration, some specificity in terms of plans, no associated intent, good self-control, limited dysphoria/symptomatology, some risk factors present, and identifiable protective factors, including available and accessible social support.  PLAN OF CARE: Patient will be admitted to inpatient psychiatric unit for stabilization and safety. Will provide and encourage milieu participation. Provide medication management and maked adjustments as needed.  Will follow daily.    Medical Decision Making:  Review of Psycho-Social Stressors (1), Review or order clinical lab tests (1), Established Problem, Worsening (2) and Review of Medication Regimen & Side Effects (2)  I certify that inpatient services furnished can reasonably be expected to improve the patient's condition.   COBOS, Madaline Guthrie 10/31/2014, 5:45 PM

## 2014-10-31 NOTE — BHH Group Notes (Signed)
[facility] Group Therapy 10/31/2014 1:15pm  Type of Therapy: [facility] Presentation  Pt came but left early, irritated, and did not return.   Chad Cordial, LCSWA 10/31/2014 2:12 PM

## 2014-10-31 NOTE — Progress Notes (Signed)
Adult Psychoeducational Group Note  Date:  10/31/2014 Time:  9:15 PM  Group Topic/Focus:  Wrap-Up Group:   The focus of this group is to help patients review their daily goal of treatment and discuss progress on daily workbooks.  Pt did not attend karaoke.   Anne Black 10/31/2014, 10:28 PM

## 2014-10-31 NOTE — Progress Notes (Signed)
Adult Psychoeducational Group Note  Date:  10/31/2014 Time:  0900  Group Topic/Focus:  Orientation:   The focus of this group is to educate the patient on the purpose and policies of crisis stabilization and provide a format to answer questions about their admission.  The group details unit policies and expectations of patients while admitted.  Participation Level:  Did Not Attend  Participation Quality:    Affect:    Cognitive:     Insight:   Engagement in Group:    Modes of Intervention:    Additional Comments:   White, Patrice L 10/31/2014, 11:34 AM
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
## 2014-11-01 DIAGNOSIS — F3110 Bipolar disorder, current episode manic without psychotic features, unspecified: Secondary | ICD-10-CM | POA: Diagnosis not present

## 2014-11-01 MED ORDER — NYSTATIN 100000 UNIT/GM EX POWD
Freq: Every day | CUTANEOUS | Status: DC
  Administered 2014-11-01 – 2014-11-05 (×15): via TOPICAL
  Filled 2014-11-01 (×2): qty 15

## 2014-11-01 MED ORDER — RISPERIDONE 2 MG PO TABS
2.0000 mg | ORAL_TABLET | Freq: Every day | ORAL | Status: DC
  Administered 2014-11-02 – 2014-11-03 (×2): 2 mg via ORAL
  Filled 2014-11-01 (×4): qty 1

## 2014-11-01 MED ORDER — CLONAZEPAM 1 MG PO TABS
1.0000 mg | ORAL_TABLET | Freq: Two times a day (BID) | ORAL | Status: DC
  Administered 2014-11-02: 1 mg via ORAL
  Filled 2014-11-01 (×5): qty 1

## 2014-11-01 MED ORDER — TOPIRAMATE 100 MG PO TABS
100.0000 mg | ORAL_TABLET | Freq: Every day | ORAL | Status: DC
  Administered 2014-11-01 – 2014-11-04 (×4): 100 mg via ORAL
  Filled 2014-11-01 (×6): qty 1

## 2014-11-01 MED ORDER — CLONAZEPAM 0.5 MG PO TABS
0.5000 mg | ORAL_TABLET | Freq: Two times a day (BID) | ORAL | Status: DC | PRN
  Administered 2014-11-02 – 2014-11-04 (×3): 0.5 mg via ORAL
  Filled 2014-11-01 (×4): qty 1

## 2014-11-01 MED ORDER — RISPERIDONE 1 MG PO TABS
1.0000 mg | ORAL_TABLET | Freq: Every day | ORAL | Status: DC
  Administered 2014-11-03 – 2014-11-05 (×3): 1 mg via ORAL
  Filled 2014-11-01 (×7): qty 1

## 2014-11-01 NOTE — [facility] Group Notes (Signed)
[facility] LCSW Group Therapy 11/01/2014 1:15pm  Type of Therapy: Group Therapy- Feelings Around Relapse and Recovery  Pt did not attend, declined invitation.   Lauren Carter, LCSWA [phone] 11/01/2014 4:59 PM  
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
## 2014-11-01 NOTE — Progress Notes (Signed)
Recreation Therapy Notes  Date: 09.23.2016 Time: 9:30am Location: [address] Group room   Group Topic: Stress Management  Goal Area(s) Addresses:  Patient will actively participate in stress management techniques presented during session.   Behavioral Response: Did not attend.   Denise L Blanchfield, LRT/CTRS        Blanchfield, Denise L 11/01/2014 1:51 PM 

## 2014-11-01 NOTE — Progress Notes (Signed)
`    D Pt remains emotionally stuck and adamant that she should not be here in this hospital, despite her  Rumination , tangential thought process, black white thinking and her lack of insight / denial about her own mental health. She has argued at every opportunity with this nurse abut her poc. She became highly enraged when this nurse told her her vyvanse and provigil had been discontinued. Her hygiene is lacking and she has been encouraged to bather ( but she refuses). [name] met with charge nurse and requested a new nurse, . She has been overheard twice on the [employer].speaking inappropriately to other patients. She is encouraged to let staff handle all personal - related problems. Per charge nurses' instruction, pt was transferred to [address] . She is in a room closest to the nurses' station..so she will get immediate resolution to her complaints and she can see her caregivers and she will be closer to walk to the dayroom. Ths nurse requested [facility] EK and charge nurse speak with pt. R POC cont.

## 2014-11-01 NOTE — [facility] Counselor (Signed)
Pt continues to be manic and irritable. PSA not completed at this time. CSW will attempt later.  Chad Cordial, LCSWA Clinical Social Work [phone]

## 2014-11-01 NOTE — Progress Notes (Signed)
Pt was upset at the beginning of the shift that she had been moved to the 500 hall.  She feels that too many people have control over her, starting with the fact that she is in the hospital.  She also talked about the need to be able to forgive so that she can move on with her healing, but when she talked with her pastor about attending a class about forgiveness, she felt the pastor brushed her off.  She says this angered her and now she can't move on with her healing.  Writer spent time with pt process her feelings and encouraged her to reach out again to her pastor as there may have been a misunderstanding.  Pt was positive and accepting of writer's conversation.  Pt denies SI/HI/AVH.  Pt received the nystatin powder for the yeast under her arm tonight.  Pt had no other needs or concerns other that the TV being too loud in the dayroom.  Pt was given ear plugs and her door was shut.  Support and encouragement offered.  Discharge plans still in process.  Safety maintained with q15 minute checks.

## 2014-11-01 NOTE — Plan of Care (Signed)
Problem: Diagnosis: Increased Risk For Suicide Attempt Goal: STG-Patient Will Comply With Medication Regime Outcome: Progressing Pt compliant with medication regime     

## 2014-11-02 DIAGNOSIS — F3111 Bipolar disorder, current episode manic without psychotic features, mild: Secondary | ICD-10-CM | POA: Diagnosis not present

## 2014-11-02 LAB — LIPID PANEL
Cholesterol: 243 mg/dL — ABNORMAL HIGH (ref 0–200)
HDL: 39 mg/dL — AB (ref >40)
LDL Cholesterol: 173 mg/dL — ABNORMAL HIGH (ref 0–99)
Total CHOL/HDL Ratio: 6.2 RATIO
Triglycerides: 154 mg/dL — ABNORMAL HIGH (ref <150)
VLDL: 31 mg/dL (ref 0–40)

## 2014-11-02 LAB — TSH: TSH: 2.96 u[IU]/mL (ref 0.350–4.500)

## 2014-11-02 NOTE — [facility] Group Notes (Signed)
[facility] Group Notes:  Coping Skills  Date:  11/02/2014  Time:  11:03 AM  Type of Therapy:  Nurse Education  Participation Level:  Did Not Attend  Participation Quality:  Inattentive  Affect:  Depressed  Cognitive:  Lacking  Insight:  None  Engagement in Group:  None  Modes of Intervention:  Discussion  Summary of Progress/Problems:Pt did not attend  Rodman Key [facility] 11/02/2014, 11:03 AM

## 2014-11-02 NOTE — Progress Notes (Signed)
[facility] Group Notes:  (Nursing/MHT/Case Management/Adjunct)  Date:  11/02/2014  Time:  8:53 PM  Type of Therapy:  Psychoeducational Skills  Participation Level:  Active  Participation Quality:  Monopolizing  Affect:  Depressed  Cognitive:  Appropriate  Insight:  Appropriate  Engagement in Group:  Distracting and Monopolizing  Modes of Intervention:  Education  Summary of Progress/Problems: The patient spoke at great length this evening about her infection in her arm. She states that the condition is presently being treated but she continues to feel uncomfortable. Next, the patient is upset about her multiple sclerosis and has a number of requests. She is requesting that the staff send her across the street to have an M.R.I performed. In addition, she would like to see a neurologist or be referred to one in order to address her multiple sclerosis. As a theme for the day, her coping skill is to go and be seen by a neurologist.    Hazle Coca S 11/02/2014, 8:53 PM
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
## 2014-11-02 NOTE — Progress Notes (Signed)
[facility] MD Progress Note  11/02/2014 1:09 PM Anne Black  MRN:  [id number] Subjective:  Patient states: "I really want to go home today if I can. I'm upset that they are giving me the medications that I want."   Objective: Pt seen and chart reviewed. Pt is alert/oriented x4, anxious, cooperative, and appropriate. However, pt is very demanding in regard to medication management and is asking for Provigil and Vyvanse. After lengthy discussion, pt accepting that we will not prescribe those medications and pt has been informed in detail as to how both of these medications may worsen manic episodes, OCD, and impulsivity. Pt denies suicidal/homicidal ideation and psychosis and does not appear to be responding to internal stimuli at this time. Pt also demanding MRI, but informed that there is no acute justification for such and that she can do this outpatient when she leaves.   Principal Problem:  Bipolar 1 disorder, manic, mild Diagnosis:   Patient Active Problem List   Diagnosis Date Noted  . Bipolar 1 disorder, manic, mild [F31.11] 10/30/2014    Priority: High  . Alcohol use disorder, moderate, dependence [F10.20] 10/30/2014  . Generalized anxiety disorder [F41.1] 01/23/2014  . Tobacco abuse [Z72.0] 01/23/2014  . Bipolar I disorder, most recent episode (or current) manic [F31.10] 04/08/2013  . Noncompliance with therapeutic plan [Z91.11] 04/04/2013  . Unspecified hereditary and idiopathic peripheral neuropathy [G60.9] 05/01/2012  . GERD (gastroesophageal reflux disease) [K21.9] 05/01/2012  . Osteoarthrosis, unspecified whether generalized or localized, involving lower leg [M17.9] 05/01/2012  . MS (multiple sclerosis) [G35] 09/14/2011  . Anemia [D64.9] 09/14/2011   Total Time spent with patient: 15 minutes   Past Medical History:  Past Medical History  Diagnosis Date  . Multiple sclerosis   . Arthritis   . Anxiety   . Bipolar disorder   . ADHD (attention deficit hyperactivity disorder)    . Migraine   . Osteoporosis     Past Surgical History  Procedure Laterality Date  . Cesarean section  [id number]  . Myringotomy with tube placement Bilateral   . Tonsillectomy     Family History:  Family History  Problem Relation Age of Onset  . Heart disease Mother    Social History:  History  Alcohol Use  . 4.4 oz/week  . 4 Standard drinks or equivalent, 2 Glasses of wine, 2 Cans of beer per week    Comment: pt states she drinks every 2-3 days     History  Drug Use No    Social History   Social History  . Marital Status: Divorced    Spouse Name: N/A  . Number of Children: 5  . Years of Education: hs   Occupational History  .      on social security disability for MS   Social History Main Topics  . Smoking status: Current Every Day Smoker -- 1.50 packs/day for 20 years    Types: Cigarettes  . Smokeless tobacco: Never Used  . Alcohol Use: 4.4 oz/week    4 Standard drinks or equivalent, 2 Glasses of wine, 2 Cans of beer per week     Comment: pt states she drinks every 2-3 days  . Drug Use: No  . Sexual Activity: Not Currently   Other Topics Concern  . None   Social History Narrative   Additional History:    Sleep: Good  Appetite:  Good  Assessment: See above  Musculoskeletal: Strength & Muscle Tone: within normal limits Gait & Station: normal Patient leans: N/A  Psychiatric Specialty Exam: Physical Exam  ROS denies headache, denies chest pain, denies shortness of breath   Blood pressure 127/59, pulse 79, temperature 97.5 F (36.4 C), temperature source Oral, resp. rate 18, height  (1.6 m), weight 92.987 kg (205 lb), SpO2 99 %.Body mass index is 36.32 kg/(m^2).  General Appearance: Fairly Groomed   Patent attorney::  Good  Speech:  Pressured and Although greatly improving  Volume:  Normal   Mood:  Anxious, Irritable and improving   Affect:  Labile and although improving  Thought Process:  no clear flight of ideations, presents more linear  today, does become tangential at times   Orientation:  Other:  fully alert and attentive   Thought Content:  denies hallucinations, no delusions expressed   Suicidal Thoughts:  No- denies any thoughts of hurting self or anyone else   Homicidal Thoughts:  No- denies any homicidal ideations towards anyone in particular- she states that   Memory:  recent and remote fair   Judgement:  Impaired  Insight:  Shallow  Psychomotor Activity:  Normal- no  Significant  restlessness noted at this time  Concentration:  Good  Recall:  Good  Fund of Knowledge:Good  Language: Good  Akathisia:  Negative  Handed:  Right  AIMS (if indicated):     Assets:  Desire for Improvement Housing Resilience  ADL's:  Impaired  Cognition: WNL  Sleep:  Number of Hours: 5.25     Current Medications: Current Facility-Administered Medications  Medication Dose Route Frequency Zhania Shaheen Last Rate Last Dose  . acetaminophen (TYLENOL) tablet 650 mg  650 mg Oral Q4H PRN Earney Navy, NP      . albuterol (PROVENTIL HFA;VENTOLIN HFA) 108 (90 BASE) MCG/ACT inhaler 2 puff  2 puff Inhalation Q6H PRN Sanjuana Kava, NP   2 puff at 11/01/14 1932  . butalbital-acetaminophen-caffeine (FIORICET, ESGIC) 50-325-40 MG per tablet 1 tablet  1 tablet Oral Q6H PRN Earney Navy, NP      . clonazePAM (KLONOPIN) tablet 0.5 mg  0.5 mg Oral BID PRN Craige Cotta, MD   0.5 mg at 11/02/14 0313  . clonazePAM (KLONOPIN) tablet 1 mg  1 mg Oral BID Craige Cotta, MD   1 mg at 11/02/14 0826  . clotrimazole (LOTRIMIN) 1 % cream   Topical BID Kerry Hough, PA-C      . hydrOXYzine (ATARAX/VISTARIL) tablet 50 mg  50 mg Oral TID PRN Worthy Flank, NP   50 mg at 10/31/14 2323  . ibuprofen (ADVIL,MOTRIN) tablet 600 mg  600 mg Oral Q8H PRN Earney Navy, NP      . meloxicam (MOBIC) tablet 7.5 mg  7.5 mg Oral Daily Rockey Situ Cobos, MD   7.5 mg at 11/02/14 0900  . mometasone-formoterol (DULERA) 100-5 MCG/ACT inhaler 2 puff  2 puff  Inhalation BID Sanjuana Kava, NP   2 puff at 11/02/14 0900  . nicotine (NICODERM CQ - dosed in mg/24 hours) patch 21 mg  21 mg Transdermal Daily Craige Cotta, MD   21 mg at 11/02/14 0932  . nystatin (MYCOSTATIN/NYSTOP) topical powder   Topical 5 X Daily Rockey Situ Cobos, MD      . pantoprazole (PROTONIX) EC tablet 40 mg  40 mg Oral BID Craige Cotta, MD   40 mg at 11/02/14 0826  . risperiDONE (RISPERDAL) tablet 1 mg  1 mg Oral Daily Craige Cotta, MD   1 mg at 11/02/14 0933  . risperiDONE (RISPERDAL)  tablet 2 mg  2 mg Oral QHS Craige Cotta, MD   2 mg at 11/01/14 2123  . topiramate (TOPAMAX) tablet 100 mg  100 mg Oral Daily Craige Cotta, MD   100 mg at 11/01/14 2038  . traZODone (DESYREL) tablet 100 mg  100 mg Oral QHS PRN Worthy Flank, NP   100 mg at 11/01/14 2118    Lab Results:  Results for orders placed or performed during the hospital encounter of 10/30/14 (from the past 48 hour(s))  TSH     Status: None   Collection Time: 11/02/14  6:36 AM  Result Value Ref Range   TSH 2.960 0.350 - 4.500 uIU/mL    Comment: Performed at [facility]    Physical Findings: AIMS: Facial and Oral Movements Muscles of Facial Expression: None, normal Lips and Perioral Area: None, normal Jaw: None, normal Tongue: None, normal,Extremity Movements Upper (arms, wrists, hands, fingers): None, normal Lower (legs, knees, ankles, toes): None, normal, Trunk Movements Neck, shoulders, hips: None, normal, Overall Severity Severity of abnormal movements (highest score from questions above): None, normal Incapacitation due to abnormal movements: None, normal, Dental Status Current problems with teeth and/or dentures?: Yes Does patient usually wear dentures?: No  CIWA:  CIWA-Ar Total: 0 COWS:  COWS Total Score: 0   Assessment -See above  Treatment Plan Summary: Daily contact with patient to assess and evaluate symptoms and progress in treatment, Medication management,  Plan inpatient admission and medications as below   Medications:  -Continue Topamax to 100 mgrs QHS for management of mood disorder -Continue Risperidone to 1 mgr QAM and 2 mgrs QHS for management of mood disorder  -Continue Klonopin to 1 mgr BID for management of agitation , mood disorder  -Continue Trazodone 100 mgrs QHS PRN for insomnia as needed  -Continue Protonix for management of GERD   Encourage milieu participation to work on Pharmacologist, symptom management /reduction. LABS - EKG, Lipid Panel, HgbA1C - routine as on antipsychotic medication.  -TSH is unremarkable; ruled out abnormal thyroid function.    Medical Decision Making:  Established Problem, Stable/Improving (1), Review of Psycho-Social Stressors (1), Review or order clinical lab tests (1) and Review of Medication Regimen & Side Effects (2)  Beau Fanny, FNP-BC 11/02/2014, 1:09 PM  Reviewed the information documented and agree with the treatment plan.  JONNALAGADDA,JANARDHAHA R. 11/04/2014 11:48 AM

## 2014-11-02 NOTE — [facility] Group Notes (Signed)
[facility] LCSW Group Therapy  11/02/2014  1:15 PM  Type of Therapy:  Group Therapy  Participation Level:  Did Not Attend although encouraged by MHT  Summary of Progress/Problems: The main focus of today's process group was for the patient to identify ways in which they have in the past sabotaged their own recovery. Motivational Interviewing was utilized to encourage patient's to explore what they wish to change. The concept of HALT (Am I hungry, angry, lonely or tired?) was introduced as a means of establishing a habit of self care.     Carney Bern, LCSW

## 2014-11-03 DIAGNOSIS — F3111 Bipolar disorder, current episode manic without psychotic features, mild: Secondary | ICD-10-CM | POA: Diagnosis not present

## 2014-11-03 NOTE — Progress Notes (Addendum)
Pt is pleasant and cooperative this am. She does contract for safety and denies Si and HI.pt remained in bed this am and had breakfast brought back to her. Pt did not take her clonopin this am due to feeling very sleepy and groggy. Pt continues to have good eye contact. She requested to sleep for a little longer this am. 12:50p-Pt at times can be manipulative at times. Pt stated she did not feel like eating lunch and wanted staff to stay in her room and talk to her. Pt was instructed she would need to come into the dayroom. Pt presently is talking to another staff member. 2;30Pt was encouraged to shower and stated she showered yesterday. Pt appears very needy at times and constantly ask the staff what they are doing. Pt was instructed she could not sit at the nurses station. 3:15p-Pt was given 2 tylenol for back pain a 6/10.6:30p-Pt phoned security to inform them to only permit her twin daughters back. Pt remains intrusive  manipulative . She was instructed she must not hang around the nurses station. Pt earlier told the nurse she could not eat carbs and must eat salads. A grilled chicken salad was given to the pt and she did not eat it. She then saw a pt eating Mac and Cheese and demanded Mac and cheese.
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
## 2014-11-03 NOTE — Progress Notes (Signed)
[facility] MD Progress Note  11/03/2014 3:14 PM Anne Black  MRN:  [id number] Subjective:  Patient states: "I feel much better today. I'm less anxious and I slept really well. I hope I can go home soon."  Objective: Pt seen and chart reviewed. Pt is alert/oriented x4, calm, cooperative, and appropriate. Pt is much more calm today and is no longer demanding medication changes. She reports feeling happy with her current medication regimen and feels less depressed and anxious at this time. Pt denies suicidal/homicidal ideation and psychosis and does not appear to be responding to internal stimuli at this time.   Principal Problem:  Bipolar 1 disorder, manic, mild Diagnosis:   Patient Active Problem List   Diagnosis Date Noted  . Bipolar 1 disorder, manic, mild [F31.11] 10/30/2014    Priority: High  . Alcohol use disorder, moderate, dependence [F10.20] 10/30/2014  . Generalized anxiety disorder [F41.1] 01/23/2014  . Tobacco abuse [Z72.0] 01/23/2014  . Bipolar I disorder, most recent episode (or current) manic [F31.10] 04/08/2013  . Noncompliance with therapeutic plan [Z91.11] 04/04/2013  . Unspecified hereditary and idiopathic peripheral neuropathy [G60.9] 05/01/2012  . GERD (gastroesophageal reflux disease) [K21.9] 05/01/2012  . Osteoarthrosis, unspecified whether generalized or localized, involving lower leg [M17.9] 05/01/2012  . MS (multiple sclerosis) [G35] 09/14/2011  . Anemia [D64.9] 09/14/2011   Total Time spent with patient: 15 minutes   Past Medical History:  Past Medical History  Diagnosis Date  . Multiple sclerosis   . Arthritis   . Anxiety   . Bipolar disorder   . ADHD (attention deficit hyperactivity disorder)   . Migraine   . Osteoporosis     Past Surgical History  Procedure Laterality Date  . Cesarean section  [id number]  . Myringotomy with tube placement Bilateral   . Tonsillectomy     Family History:  Family History  Problem Relation Age of Onset  . Heart  disease Mother    Social History:  History  Alcohol Use  . 4.4 oz/week  . 4 Standard drinks or equivalent, 2 Glasses of wine, 2 Cans of beer per week    Comment: pt states she drinks every 2-3 days     History  Drug Use No    Social History   Social History  . Marital Status: Divorced    Spouse Name: N/A  . Number of Children: 5  . Years of Education: hs   Occupational History  .      on social security disability for MS   Social History Main Topics  . Smoking status: Current Every Day Smoker -- 1.50 packs/day for 20 years    Types: Cigarettes  . Smokeless tobacco: Never Used  . Alcohol Use: 4.4 oz/week    4 Standard drinks or equivalent, 2 Glasses of wine, 2 Cans of beer per week     Comment: pt states she drinks every 2-3 days  . Drug Use: No  . Sexual Activity: Not Currently   Other Topics Concern  . None   Social History Narrative   Additional History:    Sleep: Good  Appetite:  Good  Assessment: See above  Musculoskeletal: Strength & Muscle Tone: within normal limits Gait & Station: normal Patient leans: N/A   Psychiatric Specialty Exam: Physical Exam  ROS denies headache, denies chest pain, denies shortness of breath   Blood pressure 123/74, pulse 79, temperature 98.1 F (36.7 C), temperature source Oral, resp. rate 20, height  (1.6 m), weight 92.987 kg (205 lb), SpO2  98 %.Body mass index is 36.32 kg/(m^2).  General Appearance: Fairly Groomed   Patent attorney::  Good  Speech:  Clear and Coherent, Normal Rate and Although greatly improving  Volume:  Normal   Mood:  Anxious, Depressed and although improving   Affect:  Appropriate and Congruent  Thought Process:  no clear flight of ideations, presents more linear today, does become tangential at times   Orientation:  Other:  fully alert and attentive   Thought Content:  denies hallucinations, no delusions expressed   Suicidal Thoughts:  No- denies any thoughts of hurting self or anyone else    Homicidal Thoughts:  No- denies any homicidal ideations towards anyone in particular- she states that   Memory:  recent and remote fair   Judgement:  Fair  Insight:  Fair  Psychomotor Activity:  Normal- no  Significant  restlessness noted at this time  Concentration:  Good  Recall:  Good  Fund of Knowledge:Good  Language: Good  Akathisia:  Negative  Handed:  Right  AIMS (if indicated):     Assets:  Desire for Improvement Housing Resilience  ADL's:  Impaired  Cognition: WNL  Sleep:  Number of Hours: 6.5     Current Medications: Current Facility-Administered Medications  Medication Dose Route Frequency Provider Last Rate Last Dose  . acetaminophen (TYLENOL) tablet 650 mg  650 mg Oral Q4H PRN Earney Navy, NP      . albuterol (PROVENTIL HFA;VENTOLIN HFA) 108 (90 BASE) MCG/ACT inhaler 2 puff  2 puff Inhalation Q6H PRN Sanjuana Kava, NP   2 puff at 11/01/14 1932  . butalbital-acetaminophen-caffeine (FIORICET, ESGIC) 50-325-40 MG per tablet 1 tablet  1 tablet Oral Q6H PRN Earney Navy, NP   1 tablet at 11/02/14 1615  . clonazePAM (KLONOPIN) tablet 0.5 mg  0.5 mg Oral BID PRN Craige Cotta, MD   0.5 mg at 11/02/14 1901  . clonazePAM (KLONOPIN) tablet 1 mg  1 mg Oral BID Craige Cotta, MD   1 mg at 11/02/14 0826  . clotrimazole (LOTRIMIN) 1 % cream   Topical BID Kerry Hough, PA-C      . hydrOXYzine (ATARAX/VISTARIL) tablet 50 mg  50 mg Oral TID PRN Worthy Flank, NP   50 mg at 11/02/14 2042  . ibuprofen (ADVIL,MOTRIN) tablet 600 mg  600 mg Oral Q8H PRN Earney Navy, NP      . meloxicam (MOBIC) tablet 7.5 mg  7.5 mg Oral Daily Rockey Situ Cobos, MD   7.5 mg at 11/03/14 0810  . mometasone-formoterol (DULERA) 100-5 MCG/ACT inhaler 2 puff  2 puff Inhalation BID Sanjuana Kava, NP   2 puff at 11/03/14 [phone]  . nicotine (NICODERM CQ - dosed in mg/24 hours) patch 21 mg  21 mg Transdermal Daily Craige Cotta, MD   21 mg at 11/03/14 0805  . nystatin  (MYCOSTATIN/NYSTOP) topical powder   Topical 5 X Daily Rockey Situ Cobos, MD      . pantoprazole (PROTONIX) EC tablet 40 mg  40 mg Oral BID Craige Cotta, MD   40 mg at 11/03/14 0810  . risperiDONE (RISPERDAL) tablet 1 mg  1 mg Oral Daily Craige Cotta, MD   1 mg at 11/03/14 0810  . risperiDONE (RISPERDAL) tablet 2 mg  2 mg Oral QHS Craige Cotta, MD   2 mg at 11/02/14 2037  . topiramate (TOPAMAX) tablet 100 mg  100 mg Oral Daily Craige Cotta, MD   100  mg at 11/02/14 2037  . traZODone (DESYREL) tablet 100 mg  100 mg Oral QHS PRN Worthy Flank, NP   100 mg at 11/02/14 2037    Lab Results:  Results for orders placed or performed during the hospital encounter of 10/30/14 (from the past 48 hour(s))  Lipid panel     Status: Abnormal   Collection Time: 11/02/14  6:36 AM  Result Value Ref Range   Cholesterol 243 (H) 0 - 200 mg/dL   Triglycerides 825 (H) <150 mg/dL   HDL 39 (L) >18 mg/dL   Total CHOL/HDL Ratio 6.2 RATIO   VLDL 31 0 - 40 mg/dL   LDL Cholesterol 984 (H) 0 - 99 mg/dL    Comment:        Total Cholesterol/HDL:CHD Risk Coronary Heart Disease Risk Table                     Men   Women  1/2 Average Risk   3.4   3.3  Average Risk       5.0   4.4  2 X Average Risk   9.6   7.1  3 X Average Risk  23.4   11.0        Use the calculated Patient Ratio above and the CHD Risk Table to determine the patient's CHD Risk.        ATP III CLASSIFICATION (LDL):  <100     mg/dL   Optimal  210-312  mg/dL   Near or Above                    Optimal  130-159  mg/dL   Borderline  811-886  mg/dL   High  >773     mg/dL   Very High Performed at [facility]   TSH     Status: None   Collection Time: 11/02/14  6:36 AM  Result Value Ref Range   TSH 2.960 0.350 - 4.500 uIU/mL    Comment: Performed at [facility]    Physical Findings: AIMS: Facial and Oral Movements Muscles of Facial Expression: None, normal Lips and Perioral Area: None, normal Jaw: None,  normal Tongue: None, normal,Extremity Movements Upper (arms, wrists, hands, fingers): None, normal Lower (legs, knees, ankles, toes): None, normal, Trunk Movements Neck, shoulders, hips: None, normal, Overall Severity Severity of abnormal movements (highest score from questions above): None, normal Incapacitation due to abnormal movements: None, normal Patient's awareness of abnormal movements (rate only patient's report): No Awareness, Dental Status Current problems with teeth and/or dentures?: Yes Does patient usually wear dentures?: No  CIWA:  CIWA-Ar Total: 0 COWS:  COWS Total Score: 0   Assessment -See above  Treatment Plan Summary: Daily contact with patient to assess and evaluate symptoms and progress in treatment, Medication management, Plan inpatient admission and medications as below   Medications:  -Continue Topamax to 100 mgrs QHS for management of mood disorder -Continue Risperidone to 1 mgr QAM and 2 mgrs QHS for management of mood disorder  -Continue Klonopin to 1 mgr BID for management of agitation , mood disorder  -Continue Trazodone 100 mgrs QHS PRN for insomnia as needed  -Continue Protonix for management of GERD   Non-pharmacologic:  Encourage milieu participation to work on Pharmacologist, symptom management /reduction. LABS - EKG, Lipid Panel, HgbA1C - routine as on antipsychotic medication.  -TSH is unremarkable; ruled out abnormal thyroid function.    Medical Decision Making:  Established Problem, Stable/Improving (  1), Review of Psycho-Social Stressors (1), Review or order clinical lab tests (1) and Review of Medication Regimen & Side Effects (2)  Beau Fanny, FNP-BC 11/03/2014, 3:14 PM  Reviewed the information documented and agree with the treatment plan.  JONNALAGADDA,JANARDHAHA R. 11/04/2014 3:24 PM

## 2014-11-03 NOTE — BHH Group Notes (Signed)
[facility] LCSW Group Therapy  11/03/2014 11:00 AM   Type of Therapy:  Group Therapy  Participation Level:  Did Not Attend  Reyes Ivan, LCSW 11/03/2014 1:08 PM

## 2014-11-03 NOTE — Progress Notes (Signed)
Adult Psychoeducational Group Note  Date:  11/03/2014 Time:  8:22 PM  Group Topic/Focus:  Wrap-Up Group:   The focus of this group is to help patients review their daily goal of treatment and discuss progress on daily workbooks.  Participation Level:  Active  Participation Quality:  Appropriate  Affect:  Appropriate  Cognitive:  Appropriate  Insight: Appropriate  Engagement in Group:  Engaged  Modes of Intervention:  Discussion  Additional Comments: The patient expressed that she had a good day.The patient also said that she rates her day a 8.  Octavio Manns 11/03/2014, 8:22 PM

## 2014-11-03 NOTE — BHH Group Notes (Signed)
[facility] Group Notes:  Healthy support systems  Date:  11/03/2014  Time:  11:07 AM  Type of Therapy:  Nurse Education  Participation Level:  Did Not Attend  Participation Quality:  Inattentive  Affect:  Depressed  Cognitive:  Lacking  Insight:  None  Engagement in Group:  Lacking  Modes of Intervention:  Discussion  Summary of Progress/Problems:pt remained in  bed asleep  Rodman [facility] 11/03/2014, 11:07 AM

## 2014-11-04 DIAGNOSIS — F3111 Bipolar disorder, current episode manic without psychotic features, mild: Secondary | ICD-10-CM | POA: Diagnosis not present

## 2014-11-04 DIAGNOSIS — F3164 Bipolar disorder, current episode mixed, severe, with psychotic features: Secondary | ICD-10-CM | POA: Diagnosis not present

## 2014-11-04 LAB — HEMOGLOBIN A1C
HEMOGLOBIN A1C: 5.5 % (ref 4.8–5.6)
Mean Plasma Glucose: 111 mg/dL

## 2014-11-04 MED ORDER — RISPERIDONE 1 MG PO TABS
2.5000 mg | ORAL_TABLET | Freq: Every day | ORAL | Status: DC
  Filled 2014-11-04 (×3): qty 2.5

## 2014-11-04 MED ORDER — INFLUENZA VAC SPLIT QUAD 0.5 ML IM SUSY
0.5000 mL | PREFILLED_SYRINGE | INTRAMUSCULAR | Status: AC
  Administered 2014-11-05: 0.5 mL via INTRAMUSCULAR
  Filled 2014-11-04: qty 0.5

## 2014-11-04 MED ORDER — GUAIFENESIN ER 600 MG PO TB12
600.0000 mg | ORAL_TABLET | Freq: Two times a day (BID) | ORAL | Status: DC
  Administered 2014-11-04 – 2014-11-05 (×2): 600 mg via ORAL
  Filled 2014-11-04 (×7): qty 1

## 2014-11-04 MED ORDER — BENZTROPINE MESYLATE 0.5 MG PO TABS
0.5000 mg | ORAL_TABLET | Freq: Every day | ORAL | Status: DC
  Administered 2014-11-04: 0.5 mg via ORAL
  Filled 2014-11-04 (×3): qty 1

## 2014-11-04 MED ORDER — PNEUMOCOCCAL VAC POLYVALENT 25 MCG/0.5ML IJ INJ
0.5000 mL | INJECTION | INTRAMUSCULAR | Status: AC
  Administered 2014-11-05: 0.5 mL via INTRAMUSCULAR

## 2014-11-04 NOTE — Progress Notes (Signed)
D:Pt rates depression as a 5, hopelessness as a 3 and anxiety as a 4 on 0-10 scale with 10 being the most. Pt is concerned about her yard that needs to be mowed, getting her lawn mower fixed and wanting to go home. Pt is moderately anxious and refused her klonopin this morning saying that it makes her sleepy. A:Supported pt to discuss feelings. Offered encouragement and 15 minute checks. R:Pt denies si and hi. Safety maintained on the unit.

## 2014-11-04 NOTE — Plan of Care (Signed)
Problem: Ineffective individual coping Goal: STG: Patient will remain free from self harm Outcome: Progressing Pt denies si and self harm thoughts today.

## 2014-11-04 NOTE — Progress Notes (Signed)
[facility] MD Progress Note  11/04/2014 12:07 PM FRANKYE SCHWEGEL  MRN:  [id number] Subjective:  Patient states: "I want my Vyvanse . I am OK.'  Objective: Pt seen and chart reviewed. Pt is alert/oriented x4, anxious, cooperative, and appropriate.As per nursing staff pt continues to have mood lability, as well as can be very demanding in regard to medication management . Pt continues to be very focussed on her Provigil and Vyvanse. Discussed with pt the risks of being on a stimulant medication given her diagnosis. Pt with very limited insight in to her illness.Although superficially cooperative, she presents as expansive in affect, loud and pressured at times and continues to need redirection on the unit. Discussed increasing her medications as needed to address her current symptoms.  Pt encouraged to attend groups and participate in therapeutic milieu.   Principal Problem:  Bipolar 1 disorder, manic, mild Diagnosis:   Patient Active Problem List   Diagnosis Date Noted  . Alcohol use disorder, moderate, dependence [F10.20] 10/30/2014  . Bipolar 1 disorder, manic, mild [F31.11] 10/30/2014  . Generalized anxiety disorder [F41.1] 01/23/2014  . Tobacco abuse [Z72.0] 01/23/2014  . Bipolar I disorder, most recent episode (or current) manic [F31.10] 04/08/2013  . Noncompliance with therapeutic plan [Z91.11] 04/04/2013  . Unspecified hereditary and idiopathic peripheral neuropathy [G60.9] 05/01/2012  . GERD (gastroesophageal reflux disease) [K21.9] 05/01/2012  . Osteoarthrosis, unspecified whether generalized or localized, involving lower leg [M17.9] 05/01/2012  . MS (multiple sclerosis) [G35] 09/14/2011  . Anemia [D64.9] 09/14/2011   Total Time spent with patient: 25 minutes   Past Medical History:  Past Medical History  Diagnosis Date  . Multiple sclerosis   . Arthritis   . Anxiety   . Bipolar disorder   . ADHD (attention deficit hyperactivity disorder)   . Migraine   . Osteoporosis      Past Surgical History  Procedure Laterality Date  . Cesarean section  [id number]  . Myringotomy with tube placement Bilateral   . Tonsillectomy     Family History:  Family History  Problem Relation Age of Onset  . Heart disease Mother    Social History:  History  Alcohol Use  . 4.4 oz/week  . 4 Standard drinks or equivalent, 2 Glasses of wine, 2 Cans of beer per week    Comment: pt states she drinks every 2-3 days     History  Drug Use No    Social History   Social History  . Marital Status: Divorced    Spouse Name: N/A  . Number of Children: 5  . Years of Education: hs   Occupational History  .      on social security disability for MS   Social History Main Topics  . Smoking status: Current Every Day Smoker -- 1.50 packs/day for 20 years    Types: Cigarettes  . Smokeless tobacco: Never Used  . Alcohol Use: 4.4 oz/week    4 Standard drinks or equivalent, 2 Glasses of wine, 2 Cans of beer per week     Comment: pt states she drinks every 2-3 days  . Drug Use: No  . Sexual Activity: Not Currently   Other Topics Concern  . None   Social History Narrative   Additional History:    Sleep: Good  Appetite:  Good   Musculoskeletal: Strength & Muscle Tone: within normal limits Gait & Station: normal Patient leans: N/A   Psychiatric Specialty Exam: Physical Exam  Review of Systems  Musculoskeletal: Positive for back pain.  Psychiatric/Behavioral: The patient is nervous/anxious.   All other systems reviewed and are negative.  denies headache, denies chest pain, denies shortness of breath   Blood pressure 114/76, pulse 79, temperature 97.8 F (36.6 C), temperature source Oral, resp. rate 20, height  (1.6 m), weight 92.987 kg (205 lb), SpO2 98 %.Body mass index is 36.32 kg/(m^2).  General Appearance: Fairly Groomed   Patent attorney::  Good  Speech:  Pressured  Volume:  Normal   Mood:  Anxious, Irritable and improving   Affect:  Labile and although  improving  Thought Process:  Tangential and no clear flight of ideations, presents more linear today, does become tangential at times   Orientation:  Other:  fully alert and attentive   Thought Content:  denies hallucinations, no delusions expressed   Suicidal Thoughts:  No- denies any thoughts of hurting self or anyone else   Homicidal Thoughts:  No- denies any homicidal ideations towards anyone in particular  Memory:  recent and remote fair immediate - fair  Judgement:  Impaired  Insight:  Shallow  Psychomotor Activity:  Decreased- no  Significant  restlessness noted at this time, has back pain issues   Concentration:  Fair  Recall:  Good  Fund of Knowledge:Good  Language: Good  Akathisia:  Negative  Handed:  Right  AIMS (if indicated):     Assets:  Desire for Improvement Resilience  ADL's:  Impaired  Cognition: WNL  Sleep:  Number of Hours: 6.75     Current Medications: Current Facility-Administered Medications  Medication Dose Route Frequency Provider Last Rate Last Dose  . acetaminophen (TYLENOL) tablet 650 mg  650 mg Oral Q4H PRN Earney Navy, NP   650 mg at 11/03/14 1522  . albuterol (PROVENTIL HFA;VENTOLIN HFA) 108 (90 BASE) MCG/ACT inhaler 2 puff  2 puff Inhalation Q6H PRN Sanjuana Kava, NP   2 puff at 11/01/14 1932  . butalbital-acetaminophen-caffeine (FIORICET, ESGIC) 50-325-40 MG per tablet 1 tablet  1 tablet Oral Q6H PRN Earney Navy, NP   1 tablet at 11/04/14 0945  . clonazePAM (KLONOPIN) tablet 0.5 mg  0.5 mg Oral BID PRN Craige Cotta, MD   0.5 mg at 11/02/14 1901  . clonazePAM (KLONOPIN) tablet 1 mg  1 mg Oral BID Craige Cotta, MD   1 mg at 11/02/14 0826  . clotrimazole (LOTRIMIN) 1 % cream   Topical BID Kerry Hough, PA-C      . hydrOXYzine (ATARAX/VISTARIL) tablet 50 mg  50 mg Oral TID PRN Worthy Flank, NP   50 mg at 11/03/14 2017  . ibuprofen (ADVIL,MOTRIN) tablet 600 mg  600 mg Oral Q8H PRN Earney Navy, NP   600 mg at 11/04/14  0947  . meloxicam (MOBIC) tablet 7.5 mg  7.5 mg Oral Daily Rockey Situ Cobos, MD   7.5 mg at 11/04/14 1610  . mometasone-formoterol (DULERA) 100-5 MCG/ACT inhaler 2 puff  2 puff Inhalation BID Sanjuana Kava, NP   2 puff at 11/04/14 [phone]  . nicotine (NICODERM CQ - dosed in mg/24 hours) patch 21 mg  21 mg Transdermal Daily Craige Cotta, MD   21 mg at 11/04/14 0811  . nystatin (MYCOSTATIN/NYSTOP) topical powder   Topical 5 X Daily Rockey Situ Cobos, MD      . pantoprazole (PROTONIX) EC tablet 40 mg  40 mg Oral BID Craige Cotta, MD   40 mg at 11/04/14 5409  . risperiDONE (RISPERDAL) tablet 1 mg  1 mg  Oral Daily Craige Cotta, MD   1 mg at 11/04/14 4098  . risperiDONE (RISPERDAL) tablet 2.5 mg  2.5 mg Oral QHS Saramma Eappen, MD      . topiramate (TOPAMAX) tablet 100 mg  100 mg Oral Daily Craige Cotta, MD   100 mg at 11/03/14 2017  . traZODone (DESYREL) tablet 100 mg  100 mg Oral QHS PRN Worthy Flank, NP   100 mg at 11/03/14 2016    Lab Results:  No results found for this or any previous visit (from the past 48 hour(s)).  Physical Findings: AIMS: Facial and Oral Movements Muscles of Facial Expression: None, normal Lips and Perioral Area: None, normal Jaw: None, normal Tongue: None, normal,Extremity Movements Upper (arms, wrists, hands, fingers): None, normal Lower (legs, knees, ankles, toes): None, normal, Trunk Movements Neck, shoulders, hips: None, normal, Overall Severity Severity of abnormal movements (highest score from questions above): None, normal Incapacitation due to abnormal movements: None, normal Patient's awareness of abnormal movements (rate only patient's report): No Awareness, Dental Status Current problems with teeth and/or dentures?: Yes Does patient usually wear dentures?: No  CIWA:  CIWA-Ar Total: 0 COWS:  COWS Total Score: 0   Assessment - Patient continues to have mood lability , focussed on obtaining Vyvanse , has limited insight in to her illness.  Will continue to need medications and support.   Treatment Plan Summary: Daily contact with patient to assess and evaluate symptoms and progress in treatment, Medication management, Plan inpatient admission and medications as below   Medications:  -Continue Topamax 100 mg QHS for management of mood disorder -Continue Risperidone to 1 mg PO QAM and increase to 2.5 mg PO QHS for management of mood disorder  -Continue Klonopin to 1 mg PO  BID for management of agitation , mood disorder  -Continue Trazodone 100 mgs QHS PRN for insomnia as needed  -Continue Protonix for management of GERD  - Lipid panel reviewed- will get Dietician consult.Hba1c pending.  Encourage milieu participation to work on Pharmacologist, symptom management /reducti  Medical Decision Making:  Established Problem, Stable/Improving (1), Review of Psycho-Social Stressors (1), Review or order clinical lab tests (1), New Problem, with no additional work-up planned (3), Review of Last Therapy Session (1), Review of Medication Regimen & Side Effects (2) and Review of New Medication or Change in Dosage (2)  Eappen,Saramma, MD 11/04/2014, 12:07 PM

## 2014-11-04 NOTE — [facility] Group Notes (Signed)
[facility] LCSW Group Therapy  11/04/2014 3:11 PM   Type of Therapy:  Group Therapy  Participation Level:  Active  Participation Quality:  Attentive  Affect:  Appropriate  Cognitive:  Appropriate  Insight:  Improving  Engagement in Therapy:  Engaged  Modes of Intervention:  Clarification, Education, Exploration and Socialization  Summary of Progress/Problems: Today's group focused on resilience.  We defined the term, and then built on past examples.  Cole was present for most of the group, with one break, and engaged throughout.  She talked about reslience as a theme throughout her life, starting in HS with certain classes, her struggles with infertility, having triplets and twins and raising them.  But with her diagnosis of MS, she feels she is losing stamina and energy needed to bounce back.  Identified Alanon as a means of connecting with God, which is what has helped her with resilience in the past, and outlined her plan for reconnecting.  Daryel Gerald B 11/04/2014 , 3:11 PM

## 2014-11-04 NOTE — Tx Team (Signed)
Interdisciplinary Treatment Plan Update (Adult) Date: 11/04/2014   Date: 11/04/2014 3:16 PM  Progress in Treatment:  Attending groups: Yes Participating in groups: Yes Taking medication as prescribed: Yes  Tolerating medication: Yes  Family/Significant other contact made: No Patient understands diagnosis: No  Primarily focused on her needs as it relates to [place] Discussing patient identified problems/goals with staff: Yes  Medical problems stabilized or resolved: Yes  Denies suicidal/homicidal ideation: Yes Patient has not harmed self or Others: Yes   New problem(s) identified: None identified at this time.   Discharge Plan or Barriers: Return home, follow up outpt Additional comments:    Reason for Continuation of Hospitalization:    Estimated length of stay: Likely d/c tomorrrow  Review of initial/current patient goals per problem list:   1.  Goal(s): Patient will participate in aftercare plan  Met:  Yes  Target date: 3-5 days from date of admission   As evidenced by: Patient will participate within aftercare plan AEB aftercare provider and housing plan at discharge being identified.  10/31/14: CSW to work with Pt to assess for appropriate discharge plan and faciliate appointments and referrals as needed prior to d/c. Pt plans to return home, follow up outpt.  Goal met.  R North LCSW 11/04/2014  6. Goal (s):  Patient will demonstrate decreased signs of mania . Met:  No . Target date: 3-5 days from date of admission  . As evidenced by:  Patient demonstrate decreased signs of mania AEB decreased mood instability and demonstration of stable mood    10/31/14: Pt continues to have flight of ideas, impulsive actions, intrusive behaviors, and racing thoughts.   11/04/2014 No signs nor symptoms of mania today  Attendees:  Patient:    Family:    Physician: Ursula Alert  11/04/2014 3:16 PM  Nursing: Desma Paganini 11/04/2014 3:16 PM  Clinical Social Worker Rod Conseco  11/04/2014 3:16 PM   Other: Jake Bathe Liasion 11/04/2014 3:16 PM  Clinical:   11/04/2014 3:16 PM  Other:  11/04/2014 3:16 PM  Other:     Norman Clay MSW

## 2014-11-04 NOTE — [facility] Suicide Risk Assessment (Signed)
[facility] INPATIENT:  Family/Significant Other Suicide Prevention Education  Suicide Prevention Education:  Patient Refusal for Family/Significant Other Suicide Prevention Education: The patient Anne Black has refused to provide written consent for family/significant other to be provided Family/Significant Other Suicide Prevention Education during admission and/or prior to discharge.  Physician notified.  Daryel Gerald B 11/04/2014, 8:34 AM
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
## 2014-11-05 DIAGNOSIS — E785 Hyperlipidemia, unspecified: Secondary | ICD-10-CM | POA: Insufficient documentation

## 2014-11-05 DIAGNOSIS — F3164 Bipolar disorder, current episode mixed, severe, with psychotic features: Principal | ICD-10-CM

## 2014-11-05 MED ORDER — CLONAZEPAM 0.5 MG PO TABS: 0.5000 mg | ORAL_TABLET | Freq: Two times a day (BID) | ORAL | Status: DC | PRN

## 2014-11-05 MED ORDER — TRAZODONE HCL 100 MG PO TABS: 100.0000 mg | ORAL_TABLET | Freq: Every evening | ORAL | Status: DC | PRN

## 2014-11-05 MED ORDER — BENZTROPINE MESYLATE 0.5 MG PO TABS: 0.5000 mg | ORAL_TABLET | Freq: Every day | ORAL | Status: DC

## 2014-11-05 MED ORDER — NICOTINE 21 MG/24HR TD PT24: 21.0000 mg | MEDICATED_PATCH | Freq: Every day | TRANSDERMAL | Status: DC

## 2014-11-05 MED ORDER — RISPERIDONE 0.5 MG PO TABS: 2.5000 mg | ORAL_TABLET | Freq: Every day | ORAL | Status: DC

## 2014-11-05 MED ORDER — PANTOPRAZOLE SODIUM 40 MG PO TBEC: 40.0000 mg | DELAYED_RELEASE_TABLET | Freq: Every day | ORAL | Status: DC

## 2014-11-05 MED ORDER — GUAIFENESIN ER 600 MG PO TB12: 600.0000 mg | ORAL_TABLET | Freq: Two times a day (BID) | ORAL | Status: DC

## 2014-11-05 MED ORDER — MELOXICAM 7.5 MG PO TABS: 7.5000 mg | ORAL_TABLET | Freq: Every day | ORAL | Status: DC

## 2014-11-05 MED ORDER — RISPERIDONE 1 MG PO TABS: 1.0000 mg | ORAL_TABLET | Freq: Every day | ORAL | Status: DC

## 2014-11-05 MED ORDER — TOPIRAMATE 100 MG PO TABS: 100.0000 mg | ORAL_TABLET | Freq: Every day | ORAL | Status: DC

## 2014-11-05 MED ORDER — FLUTICASONE-SALMETEROL 100-50 MCG/DOSE IN AEPB: 1.0000 | INHALATION_SPRAY | Freq: Two times a day (BID) | RESPIRATORY_TRACT | Status: DC

## 2014-11-05 NOTE — [facility] Suicide Risk Assessment (Signed)
[facility] Discharge Suicide Risk Assessment   Demographic Factors:  Caucasian  Total Time spent with patient: 30 minutes  Musculoskeletal: Strength & Muscle Tone: within normal limits Gait & Station: normal Patient leans: N/A  Psychiatric Specialty Exam: Physical Exam  Review of Systems  Psychiatric/Behavioral: Negative for depression, suicidal ideas and hallucinations. The patient is not nervous/anxious.   All other systems reviewed and are negative.   Blood pressure 114/76, pulse 79, temperature 97.8 F (36.6 C), temperature source Oral, resp. rate 20, height 5\' 3"  (1.6 m), weight 92.987 kg (205 lb), SpO2 98 %.Body mass index is 36.32 kg/(m^2).  General Appearance: Casual  Eye Contact::  Fair  Speech:  Clear and Coherent409  Volume:  Normal  Mood:  Euthymic  Affect:  Appropriate  Thought Process:  Coherent  Orientation:  Full (Time, Place, and Person)  Thought Content:  WDL  Suicidal Thoughts:  No  Homicidal Thoughts:  No  Memory:  Immediate;   Fair Recent;   Fair Remote;   Fair  Judgement:  Fair  Insight:  Shallow  Psychomotor Activity:  Normal  Concentration:  Fair  Recall:  [employer] of Knowledge:Fair  Language: Fair  Akathisia:  No    AIMS (if indicated):     Assets:  Communication Skills Desire for Improvement  Sleep:  Number of Hours: 6.75  Cognition: WNL  ADL's:  Intact   Have you used any form of tobacco in the last 30 days? (Cigarettes, Smokeless Tobacco, Cigars, and/or Pipes): Yes  Has this patient used any form of tobacco in the last 30 days? (Cigarettes, Smokeless Tobacco, Cigars, and/or Pipes) Yes, Prescription provided with nicotine patch  Mental Status Per Nursing Assessment::   On Admission:     Current Mental Status by Physician: pt denies SI/HI/AH/VH  Loss Factors: NA  Historical Factors: Impulsivity  Risk Reduction Factors:   Positive social support  Continued Clinical Symptoms:  Previous Psychiatric Diagnoses and  Treatments  Cognitive Features That Contribute To Risk:  Polarized thinking    Suicide Risk:  Minimal: No identifiable suicidal ideation.  Patients presenting with no risk factors but with morbid ruminations; may be classified as minimal risk based on the severity of the depressive symptoms  Principal Problem: Bipolar disorder, curr episode mixed, severe, with psychotic features Discharge Diagnoses:  Patient Active Problem List   Diagnosis Date Noted  . Bipolar disorder, curr episode mixed, severe, with psychotic features [F31.64] 11/05/2014  . Alcohol use disorder, moderate, dependence [F10.20] 10/30/2014  . Generalized anxiety disorder [F41.1] 01/23/2014  . Tobacco abuse [Z72.0] 01/23/2014  . Bipolar I disorder, most recent episode (or current) manic [F31.10] 04/08/2013  . Noncompliance with therapeutic plan [Z91.11] 04/04/2013  . Unspecified hereditary and idiopathic peripheral neuropathy [G60.9] 05/01/2012  . GERD (gastroesophageal reflux disease) [K21.9] 05/01/2012  . Osteoarthrosis, unspecified whether generalized or localized, involving lower leg [M17.9] 05/01/2012  . MS (multiple sclerosis) [G35] 09/14/2011  . Anemia [D64.9] 09/14/2011    Follow-up Information    Follow up with [facility] Outpatient program On 11/15/2014.   Why:  Friday at 8:00AM for an 8:30 appointment with Dr Lolly Mustache.  Bring along your filled out new patient packet.   Contact information:   [address]  [facility]  [phone]      Plan Of Care/Follow-up recommendations:  Activity:  No restrictions Diet:  regular Tests:  as needed Other:  follow up with after care  Is patient on multiple antipsychotic therapies at discharge:  No  Has Patient had three or more failed trials of antipsychotic monotherapy by history:  No  Recommended Plan for Multiple Antipsychotic Therapies: NA    Eappen,Saramma MD 11/05/2014, 9:26 AM
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
## 2014-11-05 NOTE — Progress Notes (Addendum)
D: Pt presents blunted in affect and labile in mood. Pt is visible and active within the milieu. Pt denied any SI/HI/AVH. Pt requested to take one of her available doses of klonopin this evening. Per pt, she refused previous doses to avoid being sleepy during the day. Pt observed in the dayroom watching the presidential debate. Pt later questions writer on the President's involvement or non-involvement with laws in regards to being IVC'd. Writer informed pt that Clinical research associate was unaware of the correct answer for her stated question.  A: Writer administered scheduled and prn medications to pt, per MD orders. Continued support and availability as needed was extended to this pt. Staff continues to monitor pt with q82min checks.  R: No adverse drug reactions noted. Pt receptive to treatment. Pt remains safe at this time.

## 2014-11-05 NOTE — Progress Notes (Signed)
  [facility] Adult Case Management Discharge Plan :  Will you be returning to the same living situation after discharge:  Yes,  home At discharge, do you have transportation home?: Yes,  cab Do you have the ability to pay for your medications: Yes,  insurance  Release of information consent forms completed and in the chart;  Patient's signature needed at discharge.  Patient to Follow up at: Follow-up Information    Follow up with [facility] Outpatient program On 11/15/2014.   Why:  Friday at 8:00AM for an 8:30 appointment with Dr Lolly Mustache.  Bring along your filled out new patient packet.   Contact information:   [address]  [facility]  [phone]      Patient denies SI/HI: Yes,  yes    Safety Planning and Suicide Prevention discussed: Yes,  yes  Have you used any form of tobacco in the last 30 days? (Cigarettes, Smokeless Tobacco, Cigars, and/or Pipes): Yes  Has patient been referred to the Quitline?: Yes, faxed on 11/05/14  Ida Rogue 11/05/2014, 9:20 AM
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
## 2014-11-05 NOTE — Progress Notes (Signed)
Psychoeducational Group Note  Date:  11/05/2014 Time:  0920  Group Topic/Focus:  Recovery Goals:   The focus of this group is to identify appropriate goals for recovery and establish a plan to achieve them.  Participation Level: Did Not Attend  Participation Quality:  Not Applicable  Affect:  Not Applicable  Cognitive:  Not Applicable  Insight:  Not Applicable  Engagement in Group: Not Applicable  Additional Comments:  Pt was taking a shower and unable to attend  group this morning.  TRINITY, JOEL E 11/05/2014, 10:34 AM

## 2014-11-06 NOTE — Progress Notes (Signed)
Calls placed to both cell phone and home phone regarding prescriptions that pt left behind upon discharge. [place] left on both phones.  Prescriptions remain in discharge cabinet in case pt calls back.  This RN offered to fax meds to pharmacy if pt returns call.  Ok Edwards, RN

## 2014-11-12 ENCOUNTER — Encounter (HOSPITAL_COMMUNITY): Payer: Self-pay

## 2014-11-12 ENCOUNTER — Inpatient Hospital Stay (HOSPITAL_COMMUNITY)
Admission: AD | Admit: 2014-11-12 | Discharge: 2014-11-16 | DRG: 885 | Disposition: A | Discharge status: Home / Self Care | Payer: Managed Care, Other (non HMO) | Attending: Psychiatry | Admitting: Psychiatry

## 2014-11-12 ENCOUNTER — Encounter (HOSPITAL_COMMUNITY): Payer: Self-pay | Admitting: Emergency Medicine

## 2014-11-12 ENCOUNTER — Emergency Department (HOSPITAL_COMMUNITY)
Admission: EM | Admit: 2014-11-12 | Discharge: 2014-11-12 | Disposition: A | Discharge status: Other Acute Inpatient Hospital | Payer: Managed Care, Other (non HMO) | Attending: Emergency Medicine | Admitting: Emergency Medicine

## 2014-11-12 DIAGNOSIS — K219 Gastro-esophageal reflux disease without esophagitis: Secondary | ICD-10-CM | POA: Diagnosis present

## 2014-11-12 DIAGNOSIS — R45851 Suicidal ideations: Secondary | ICD-10-CM

## 2014-11-12 DIAGNOSIS — Z720 Tobacco use: Secondary | ICD-10-CM | POA: Insufficient documentation

## 2014-11-12 DIAGNOSIS — F17210 Nicotine dependence, cigarettes, uncomplicated: Secondary | ICD-10-CM | POA: Diagnosis present

## 2014-11-12 DIAGNOSIS — F319 Bipolar disorder, unspecified: Secondary | ICD-10-CM | POA: Diagnosis present

## 2014-11-12 DIAGNOSIS — G35 Multiple sclerosis: Secondary | ICD-10-CM | POA: Diagnosis present

## 2014-11-12 DIAGNOSIS — E221 Hyperprolactinemia: Secondary | ICD-10-CM | POA: Diagnosis present

## 2014-11-12 DIAGNOSIS — F909 Attention-deficit hyperactivity disorder, unspecified type: Secondary | ICD-10-CM | POA: Diagnosis not present

## 2014-11-12 DIAGNOSIS — F419 Anxiety disorder, unspecified: Secondary | ICD-10-CM | POA: Diagnosis not present

## 2014-11-12 DIAGNOSIS — Z79899 Other long term (current) drug therapy: Secondary | ICD-10-CM | POA: Diagnosis not present

## 2014-11-12 DIAGNOSIS — Z7951 Long term (current) use of inhaled steroids: Secondary | ICD-10-CM | POA: Insufficient documentation

## 2014-11-12 DIAGNOSIS — F3110 Bipolar disorder, current episode manic without psychotic features, unspecified: Secondary | ICD-10-CM | POA: Diagnosis not present

## 2014-11-12 DIAGNOSIS — Z791 Long term (current) use of non-steroidal anti-inflammatories (NSAID): Secondary | ICD-10-CM | POA: Diagnosis not present

## 2014-11-12 DIAGNOSIS — G43909 Migraine, unspecified, not intractable, without status migrainosus: Secondary | ICD-10-CM | POA: Insufficient documentation

## 2014-11-12 DIAGNOSIS — M1990 Unspecified osteoarthritis, unspecified site: Secondary | ICD-10-CM | POA: Insufficient documentation

## 2014-11-12 DIAGNOSIS — F3163 Bipolar disorder, current episode mixed, severe, without psychotic features: Secondary | ICD-10-CM | POA: Diagnosis present

## 2014-11-12 DIAGNOSIS — F1020 Alcohol dependence, uncomplicated: Secondary | ICD-10-CM | POA: Diagnosis present

## 2014-11-12 LAB — CBC
HEMATOCRIT: 43.1 % (ref 36.0–46.0)
Hemoglobin: 14.8 g/dL (ref 12.0–15.0)
MCH: 33.7 pg (ref 26.0–34.0)
MCHC: 34.3 g/dL (ref 30.0–36.0)
MCV: 98.2 fL (ref 78.0–100.0)
Platelets: 267 10*3/uL (ref 150–400)
RBC: 4.39 MIL/uL (ref 3.87–5.11)
RDW: 12.2 % (ref 11.5–15.5)
WBC: 9.7 10*3/uL (ref 4.0–10.5)

## 2014-11-12 LAB — COMPREHENSIVE METABOLIC PANEL
ALBUMIN: 3.9 g/dL (ref 3.5–5.0)
ALK PHOS: 120 U/L (ref 38–126)
ALT: 50 U/L (ref 14–54)
ANION GAP: 10 (ref 5–15)
AST: 45 U/L — ABNORMAL HIGH (ref 15–41)
BILIRUBIN TOTAL: 0.2 mg/dL — AB (ref 0.3–1.2)
BUN: 6 mg/dL (ref 6–20)
CALCIUM: 9.2 mg/dL (ref 8.9–10.3)
CO2: 29 mmol/L (ref 22–32)
Chloride: 101 mmol/L (ref 101–111)
Creatinine, Ser: 0.45 mg/dL (ref 0.44–1.00)
GFR calc Af Amer: >60 mL/min (ref >60)
GFR calc non Af Amer: >60 mL/min (ref >60)
GLUCOSE: 108 mg/dL — AB (ref 65–99)
Potassium: 3.6 mmol/L (ref 3.5–5.1)
Sodium: 140 mmol/L (ref 135–145)
TOTAL PROTEIN: 7.8 g/dL (ref 6.5–8.1)

## 2014-11-12 LAB — SALICYLATE LEVEL: Salicylate Lvl: <4 mg/dL (ref 2.8–30.0)

## 2014-11-12 LAB — RAPID URINE DRUG SCREEN, HOSP PERFORMED
Amphetamines: NOT DETECTED
BARBITURATES: NOT DETECTED
Benzodiazepines: NOT DETECTED
COCAINE: NOT DETECTED
Opiates: NOT DETECTED
Tetrahydrocannabinol: NOT DETECTED

## 2014-11-12 LAB — ETHANOL: Alcohol, Ethyl (B): 126 mg/dL — ABNORMAL HIGH (ref <5)

## 2014-11-12 LAB — ACETAMINOPHEN LEVEL

## 2014-11-12 MED ORDER — RISPERIDONE 1 MG PO TABS
1.0000 mg | ORAL_TABLET | Freq: Every day | ORAL | Status: DC
Start: 2014-11-13 — End: 2014-11-13
  Filled 2014-11-12 (×2): qty 1

## 2014-11-12 MED ORDER — OLANZAPINE 5 MG PO TBDP
5.0000 mg | ORAL_TABLET | Freq: Once | ORAL | Status: AC
  Administered 2014-11-12: 5 mg via ORAL
  Filled 2014-11-12 (×2): qty 1

## 2014-11-12 MED ORDER — MELOXICAM 7.5 MG PO TABS
7.5000 mg | ORAL_TABLET | Freq: Every day | ORAL | Status: DC
  Administered 2014-11-12 – 2014-11-16 (×5): 7.5 mg via ORAL
  Filled 2014-11-12 (×7): qty 1

## 2014-11-12 MED ORDER — CLONAZEPAM 0.5 MG PO TABS
0.5000 mg | ORAL_TABLET | Freq: Two times a day (BID) | ORAL | Status: DC | PRN
  Administered 2014-11-12 – 2014-11-14 (×5): 0.5 mg via ORAL
  Filled 2014-11-12 (×6): qty 1

## 2014-11-12 MED ORDER — NICOTINE 21 MG/24HR TD PT24: 21.0000 mg | MEDICATED_PATCH | Freq: Every day | TRANSDERMAL | Status: DC

## 2014-11-12 MED ORDER — ACETAMINOPHEN 325 MG PO TABS
650.0000 mg | ORAL_TABLET | ORAL | Status: DC | PRN
  Administered 2014-11-12: 650 mg via ORAL
  Filled 2014-11-12: qty 2

## 2014-11-12 MED ORDER — PANTOPRAZOLE SODIUM 40 MG PO TBEC
40.0000 mg | DELAYED_RELEASE_TABLET | Freq: Every day | ORAL | Status: DC
  Administered 2014-11-13 – 2014-11-16 (×4): 40 mg via ORAL
  Filled 2014-11-12 (×6): qty 1

## 2014-11-12 MED ORDER — BENZTROPINE MESYLATE 0.5 MG PO TABS
0.5000 mg | ORAL_TABLET | Freq: Every day | ORAL | Status: DC
  Administered 2014-11-12 – 2014-11-15 (×4): 0.5 mg via ORAL
  Filled 2014-11-12 (×6): qty 1

## 2014-11-12 MED ORDER — ACETAMINOPHEN 325 MG PO TABS
650.0000 mg | ORAL_TABLET | ORAL | Status: DC | PRN
  Administered 2014-11-12 – 2014-11-13 (×2): 650 mg via ORAL
  Filled 2014-11-12 (×2): qty 2

## 2014-11-12 MED ORDER — NICOTINE 21 MG/24HR TD PT24
21.0000 mg | MEDICATED_PATCH | Freq: Every day | TRANSDERMAL | Status: DC
  Filled 2014-11-12: qty 1

## 2014-11-12 MED ORDER — LORAZEPAM 1 MG PO TABS
1.0000 mg | ORAL_TABLET | Freq: Once | ORAL | Status: AC
  Administered 2014-11-12: 1 mg via ORAL
  Filled 2014-11-12: qty 1

## 2014-11-12 MED ORDER — ONDANSETRON HCL 4 MG PO TABS
4.0000 mg | ORAL_TABLET | Freq: Three times a day (TID) | ORAL | Status: DC | PRN
  Administered 2014-11-12: 4 mg via ORAL
  Filled 2014-11-12: qty 1

## 2014-11-12 MED ORDER — ONDANSETRON HCL 4 MG PO TABS
4.0000 mg | ORAL_TABLET | Freq: Three times a day (TID) | ORAL | Status: DC | PRN
  Administered 2014-11-15: 4 mg via ORAL
  Filled 2014-11-12: qty 1

## 2014-11-12 MED ORDER — NICOTINE 21 MG/24HR TD PT24
21.0000 mg | MEDICATED_PATCH | Freq: Every day | TRANSDERMAL | Status: DC
  Administered 2014-11-12 – 2014-11-16 (×5): 21 mg via TRANSDERMAL
  Filled 2014-11-12 (×7): qty 1

## 2014-11-12 MED ORDER — TRAZODONE HCL 100 MG PO TABS
100.0000 mg | ORAL_TABLET | Freq: Every evening | ORAL | Status: DC | PRN
  Administered 2014-11-12 – 2014-11-13 (×2): 100 mg via ORAL
  Filled 2014-11-12 (×2): qty 1

## 2014-11-12 MED ORDER — MOMETASONE FURO-FORMOTEROL FUM 100-5 MCG/ACT IN AERO
2.0000 | INHALATION_SPRAY | Freq: Two times a day (BID) | RESPIRATORY_TRACT | Status: DC
  Administered 2014-11-12 – 2014-11-16 (×8): 2 via RESPIRATORY_TRACT
  Filled 2014-11-12: qty 8.8

## 2014-11-12 MED ORDER — RISPERIDONE 2 MG PO TABS
2.5000 mg | ORAL_TABLET | Freq: Every day | ORAL | Status: DC
  Administered 2014-11-12: 2 mg via ORAL
  Filled 2014-11-12 (×3): qty 1

## 2014-11-12 NOTE — ED Notes (Addendum)
Pt presents with IVC papers stating she left a voicemail, stating,"Take me away from this Misery.  I should die, shoot me."  Daughter states pt is noncompliant with medications and abuses alcohol.  Pt could not recall the reason she is in facility tonight with IVC papers taken out on her.  Denies SI, HI or AV hallucinations, denies feeling hopeless.  Pt denies being diagnosed with Bipolar DO, reports she is diagnosed with Adjustment DO, Anxiety, Depression and MS.  Pt reports she was admitted to [facility] x 1 week ago. Awake, alert & responsive, no acute distress noted, cooperative and anxious.  Monitoring for safety, Q 15 min checks in effect.

## 2014-11-12 NOTE — ED Notes (Signed)
Pt assessed by TTS Terri prior to RN eval.
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
## 2014-11-12 NOTE — ED Provider Notes (Signed)
CSN: [id number]     Arrival date & time 11/12/14  0043 History   First MD Initiated Contact with Patient 11/12/14 0120     Chief Complaint  Patient presents with  . Suicidal     (Consider location/radiation/quality/duration/timing/severity/associated sxs/prior Treatment) HPI Comments: Anne Black presents with [facility] under IVC for reportedly making suicidal threats via phone to her daughter who took out the petition on her mother. The patient denies SI/HI, hallucinations. She states she was at home cooking.   The history is provided by the patient. No language interpreter was used.    Past Medical History  Diagnosis Date  . Multiple sclerosis (HCC)   . Arthritis   . Anxiety   . Bipolar disorder (HCC)   . ADHD (attention deficit hyperactivity disorder)   . Migraine   . Osteoporosis    Past Surgical History  Procedure Laterality Date  . Cesarean section  [id number]  . Myringotomy with tube placement Bilateral   . Tonsillectomy     Family History  Problem Relation Age of Onset  . Heart disease Mother    Social History  Substance Use Topics  . Smoking status: Current Every Day Smoker -- 1.50 packs/day for 20 years    Types: Cigarettes  . Smokeless tobacco: Never Used  . Alcohol Use: 4.4 oz/week    4 Standard drinks or equivalent, 2 Glasses of wine, 2 Cans of beer per week     Comment: pt states she drinks every 2-3 days   OB History    No data available     Review of Systems  Constitutional: Negative for fever and chills.  HENT: Negative.   Respiratory: Negative.   Cardiovascular: Negative.   Gastrointestinal: Negative.   Musculoskeletal: Negative.   Skin: Negative.   Neurological: Negative.       Allergies  Review of patient's allergies indicates no known allergies.  Home Medications   Prior to Admission medications   Medication Sig Start Date End Date Taking? Authorizing Provider  albuterol (PROVENTIL HFA;VENTOLIN HFA) 108 (90 BASE)  MCG/ACT inhaler Inhale 2 puffs into the lungs every 6 (six) hours as needed for wheezing. 02/28/14  Yes Tiffany L Reed, DO  butalbital-acetaminophen-caffeine (FIORICET, ESGIC) 50-325-40 MG per tablet Take 1-2 tablets by mouth every 6 (six) hours as needed. For neuropathy pain. 09/17/14  Yes Historical Provider, MD  clonazePAM (KLONOPIN) 0.5 MG tablet Take 1 tablet (0.5 mg total) by mouth 2 (two) times daily as needed (Anxiety, Agitation). 11/05/14  Yes Beau Fanny, FNP  Fluticasone-Salmeterol (ADVAIR) 100-50 MCG/DOSE AEPB Inhale 1 puff into the lungs 2 (two) times daily. 11/05/14  Yes Beau Fanny, FNP  guaiFENesin (MUCINEX) 600 MG 12 hr tablet Take 1 tablet (600 mg total) by mouth 2 (two) times daily. 11/05/14  Yes Beau Fanny, FNP  lidocaine (LIDODERM) 5 % Place 1 patch onto the skin daily as needed (for area underarm for redness/swelling/pain.). Remove & Discard patch within 12 hours or as directed by MD   Yes Historical Provider, MD  meloxicam (MOBIC) 7.5 MG tablet Take 1 tablet (7.5 mg total) by mouth daily. 11/05/14  Yes Beau Fanny, FNP  methylPREDNISolone (MEDROL) 4 MG tablet Take 4 mg by mouth daily.   Yes Historical Provider, MD  modafinil (PROVIGIL) 200 MG tablet Take 200 mg by mouth daily. 10/19/14  Yes Historical Provider, MD  pantoprazole (PROTONIX) 40 MG tablet Take 1 tablet (40 mg total) by mouth daily before breakfast. 11/05/14  Yes John  C Withrow, FNP  rizatriptan (MAXALT) 10 MG tablet Take 10 mg by mouth daily as needed. For migraine headache. 09/05/14  Yes Historical Provider, MD  topiramate (TOPAMAX) 100 MG tablet Take 1 tablet (100 mg total) by mouth daily. 11/05/14  Yes Beau Fanny, FNP  traZODone (DESYREL) 100 MG tablet Take 1 tablet (100 mg total) by mouth at bedtime as needed for sleep. 11/05/14  Yes John Salena Saner Withrow, FNP  VYVANSE 40 MG capsule Take 40 mg by mouth 2 (two) times daily. 10/20/14  Yes Historical Provider, MD  benztropine (COGENTIN) 0.5 MG tablet Take 1 tablet (0.5  mg total) by mouth at bedtime. Patient not taking: Reported on 11/12/2014 11/05/14   Beau Fanny, FNP  nicotine (NICODERM CQ - DOSED IN MG/24 HOURS) 21 mg/24hr patch Place 1 patch (21 mg total) onto the skin daily. Patient not taking: Reported on 11/12/2014 11/05/14   Beau Fanny, FNP  risperiDONE (RISPERDAL) 0.5 MG tablet Take 5 tablets (2.5 mg total) by mouth at bedtime. Patient not taking: Reported on 11/12/2014 11/05/14   Beau Fanny, FNP  risperiDONE (RISPERDAL) 1 MG tablet Take 1 tablet (1 mg total) by mouth daily. Patient not taking: Reported on 11/12/2014 11/05/14   Everardo All Withrow, FNP   BP 131/71 mmHg  Pulse 97  Temp(Src) 97.5 F (36.4 C) (Oral)  Resp 18  SpO2 95% Physical Exam  Constitutional: She appears well-developed and well-nourished.  HENT:  Head: Normocephalic.  Neck: Normal range of motion. Neck supple.  Cardiovascular: Normal rate and regular rhythm.   Pulmonary/Chest: Effort normal and breath sounds normal.  Abdominal: Soft. Bowel sounds are normal. There is no tenderness. There is no rebound and no guarding.  Musculoskeletal: Normal range of motion.  Neurological: She is alert. No cranial nerve deficit.  Skin: Skin is warm and dry. No rash noted.  Psychiatric: She has a normal mood and affect. Thought content normal.  Patient is hyperverbal but otherwise calm, cooperative and oriented.    ED Course  Procedures (including critical care time) Labs Review Labs Reviewed  COMPREHENSIVE METABOLIC PANEL  ETHANOL  SALICYLATE LEVEL  ACETAMINOPHEN LEVEL  CBC  URINE RAPID DRUG SCREEN, HOSP PERFORMED   Results for orders placed or performed during the hospital encounter of 11/12/14  Comprehensive metabolic panel  Result Value Ref Range   Sodium 140 135 - 145 mmol/L   Potassium 3.6 3.5 - 5.1 mmol/L   Chloride 101 101 - 111 mmol/L   CO2 29 22 - 32 mmol/L   Glucose, Bld 108 (H) 65 - 99 mg/dL   BUN 6 6 - 20 mg/dL   Creatinine, Ser 1.61 0.44 - 1.00 mg/dL    Calcium 9.2 8.9 - 09.6 mg/dL   Total Protein 7.8 6.5 - 8.1 g/dL   Albumin 3.9 3.5 - 5.0 g/dL   AST 45 (H) 15 - 41 U/L   ALT 50 14 - 54 U/L   Alkaline Phosphatase 120 38 - 126 U/L   Total Bilirubin 0.2 (L) 0.3 - 1.2 mg/dL   GFR calc non Af Amer >60 >60 mL/min   GFR calc Af Amer >60 >60 mL/min   Anion gap 10 5 - 15  Ethanol (ETOH)  Result Value Ref Range   Alcohol, Ethyl (B) 126 (H) <5 mg/dL  Salicylate level  Result Value Ref Range   Salicylate Lvl <4.0 2.8 - 30.0 mg/dL  Acetaminophen level  Result Value Ref Range   Acetaminophen (Tylenol), Serum <10 (L) 10 - 30  ug/mL  CBC  Result Value Ref Range   WBC 9.7 4.0 - 10.5 K/uL   RBC 4.39 3.87 - 5.11 MIL/uL   Hemoglobin 14.8 12.0 - 15.0 g/dL   HCT 41.6 60.6 - 30.1 %   MCV 98.2 78.0 - 100.0 fL   MCH 33.7 26.0 - 34.0 pg   MCHC 34.3 30.0 - 36.0 g/dL   RDW 60.1 09.3 - 23.5 %   Platelets 267 150 - 400 K/uL  Urine rapid drug screen (hosp performed) (Not at [facility])  Result Value Ref Range   Opiates NONE DETECTED NONE DETECTED   Cocaine NONE DETECTED NONE DETECTED   Benzodiazepines NONE DETECTED NONE DETECTED   Amphetamines NONE DETECTED NONE DETECTED   Tetrahydrocannabinol NONE DETECTED NONE DETECTED   Barbiturates NONE DETECTED NONE DETECTED    Imaging Review No results found. I have personally reviewed and evaluated these images and lab results as part of my medical decision-making.   EKG Interpretation None      MDM   Final diagnoses:  None    1. Suicidal ideation, IVC  The patient denies any suicidal thoughts or threats. She states her daughter does this frequently and is upset she has to stay. TTS consultation to determine need for placement.   Elpidio Anis, PA-C 11/12/14 5732  April Palumbo, MD 11/12/14 [phone]

## 2014-11-12 NOTE — Consult Note (Signed)
[facility] Consult   Reason for Consult:  Bipolar  Disorder, manic, Medication non compliant Referring Physician:  EDP [name] MRN:  [id number] Principal Diagnosis: Bipolar I disorder, most recent episode (or current) manic ([place]) Diagnosis:   Patient Active Problem List   Diagnosis Date Noted  . Bipolar I disorder, most recent episode (or current) manic ([place]) [F31.10] 04/08/2013    Priority: High  . Bipolar disorder, curr episode mixed, severe, with psychotic features ([place]) [F31.64] 11/05/2014  . Dyslipidemia [E78.5]   . Alcohol use disorder, moderate, dependence ([place]) [F10.20] 10/30/2014  . Generalized anxiety disorder [F41.1] 01/23/2014  . Tobacco abuse [Z72.0] 01/23/2014  . Noncompliance with therapeutic plan [Z91.11] 04/04/2013  . Unspecified hereditary and idiopathic peripheral neuropathy [G60.9] 05/01/2012  . GERD (gastroesophageal reflux disease) [K21.9] 05/01/2012  . Osteoarthrosis, unspecified whether generalized or localized, involving lower leg [M17.9] 05/01/2012  . MS (multiple sclerosis) ([place]) [G35] 09/14/2011  . Anemia [D64.9] 09/14/2011    Total Time spent with patient: 1 hour  Subjective:   Anne Black is a 54 y.o. female patient admitted with Bipolar  Disorder, manic, Medication non compliant .  HPI: Caucasian female51 years old was evaluated for manic symptoms and for not taking her medications.  Patient was discharged from our inpatient unit on the 27th of September after treatment for Alcohol abuse and Bipolar disorder.  Patient was IVC by her husband the last time she was with [place] for driving while intoxicated and not taking her medications.  She was IVC by her daughter yesterday for not taking her medications and left a voice message that she wanted to die.  Patient denies every allegation in the IVC paper.  She states she is taking her medications and was supposed to see her Psychiatrist post admission.  That  appointment was cancelled for today.  Patient had tangential speech and FOI.  Her speech is pressured  and she is  rambling from topic to another.  She denied leaving a message about wanting to die and stated that he family members are used to committing her for no reasons.  Patient is seen constantly on the phone calling her family members asking to be discharged.  Patient is unkempt and disheveled.  She denies SI/HI/AVH.  She has been accepted for admission and she has a room assigned.  Past Psychiatric History: Alcoholism, Bipolar disorder  Risk to Self: Suicidal Ideation: No Suicidal Intent: No Is patient at risk for suicide?: No Suicidal Plan?: No Access to Means: Yes Specify Access to Suicidal Means: Pills  What has been your use of drugs/alcohol within the last 12 months?: Admits using alcohol  How many times?: 0 Other Self Harm Risks: None  Triggers for Past Attempts: None known Intentional Self Injurious Behavior: None Risk to Others: Homicidal Ideation: No Thoughts of Harm to Others: No Current Homicidal Intent: No Current Homicidal Plan: No Access to Homicidal Means: No Identified Victim: None  History of harm to others?: No Assessment of Violence: None Noted Violent Behavior Description: None  Does patient have access to weapons?: No Criminal Charges Pending?: Yes Describe Pending Criminal Charges: "Confidential" Does patient have a court date: No (Unk ) Court Date:  Tomasita Crumble ) Prior Inpatient Therapy: Prior Inpatient Therapy: Yes Prior Therapy Dates: 2015, 2016 Prior Therapy Facilty/Provider(s): [facility]  Reason for Treatment: Bipolar D/O  Prior Outpatient Therapy: Prior Outpatient Therapy: Yes Prior Therapy Dates: Current  Prior Therapy Facilty/Provider(s): 2016--Upcoming appt  Reason for Treatment: Med mgt  Does  patient have an ACCT team?: No Does patient have Intensive In-House Services?  : No Does patient have [name] services? : No Does patient have P4CC services?:  No  Past Medical History:  Past Medical History  Diagnosis Date  . Multiple sclerosis ([place])   . Arthritis   . Anxiety   . Bipolar disorder ([place])   . ADHD (attention deficit hyperactivity disorder)   . Migraine   . Osteoporosis     Past Surgical History  Procedure Laterality Date  . Cesarean section  [id number]  . Myringotomy with tube placement Bilateral   . Tonsillectomy     Family History:  Family History  Problem Relation Age of Onset  . Heart disease Mother    Family Psychiatric  History:  Positive family of mental illness but does not know what Social History:  History  Alcohol Use  . 4.4 oz/week  . 4 Standard drinks or equivalent, 2 Glasses of wine, 2 Cans of beer per week    Comment: pt states she drinks every 2-3 days     History  Drug Use No    Social History   Social History  . Marital Status: Divorced    Spouse Name: N/A  . Number of Children: 5  . Years of Education: hs   Occupational History  .      on social security disability for MS   Social History Main Topics  . Smoking status: Current Every Day Smoker -- 1.50 packs/day for 20 years    Types: Cigarettes  . Smokeless tobacco: Never Used  . Alcohol Use: 4.4 oz/week    4 Standard drinks or equivalent, 2 Glasses of wine, 2 Cans of beer per week     Comment: pt states she drinks every 2-3 days  . Drug Use: No  . Sexual Activity: Not Currently   Other Topics Concern  . None   Social History Narrative   Additional Social History:    Pain Medications: See MAR  Prescriptions: See MAR  Over the Counter: See MAR  History of alcohol / drug use?: Yes Longest period of sobriety (when/how long): None  Negative Consequences of Use: Work / Youth worker, Charity fundraiser relationships, Scientist, research (physical sciences) Withdrawal Symptoms: Other (Comment) (No current w/d sxs ) Name of Substance 1: Alcohol  1 - Age of First Use: 20's  1 - Amount (size/oz): 1 Beer  1 - Frequency: Daily  1 - Duration: On-going  1 - Last Use / Amount:  11/11/14                   Allergies:  No Known Allergies  Labs:  Results for orders placed or performed during the hospital encounter of 11/12/14 (from the past 48 hour(s))  Comprehensive metabolic panel     Status: Abnormal   Collection Time: 11/12/14  1:27 AM  Result Value Ref Range   Sodium 140 135 - 145 mmol/L   Potassium 3.6 3.5 - 5.1 mmol/L   Chloride 101 101 - 111 mmol/L   CO2 29 22 - 32 mmol/L   Glucose, Bld 108 (H) 65 - 99 mg/dL   BUN 6 6 - 20 mg/dL   Creatinine, Ser 0.45 0.44 - 1.00 mg/dL   Calcium 9.2 8.9 - 10.3 mg/dL   Total Protein 7.8 6.5 - 8.1 g/dL   Albumin 3.9 3.5 - 5.0 g/dL   AST 45 (H) 15 - 41 U/L   ALT 50 14 - 54 U/L   Alkaline Phosphatase 120 38 - 126  U/L   Total Bilirubin 0.2 (L) 0.3 - 1.2 mg/dL   GFR calc non Af Amer >60 >60 mL/min   GFR calc Af Amer >60 >60 mL/min    Comment: (NOTE) The eGFR has been calculated using the CKD EPI equation. This calculation has not been validated in all clinical situations. eGFR's persistently <60 mL/min signify possible Chronic Kidney Disease.    Anion gap 10 5 - 15  Ethanol (ETOH)     Status: Abnormal   Collection Time: 11/12/14  1:27 AM  Result Value Ref Range   Alcohol, Ethyl (B) 126 (H) <5 mg/dL    Comment:        LOWEST DETECTABLE LIMIT FOR SERUM ALCOHOL IS 5 mg/dL FOR MEDICAL PURPOSES ONLY   Salicylate level     Status: None   Collection Time: 11/12/14  1:27 AM  Result Value Ref Range   Salicylate Lvl <2.5 2.8 - 30.0 mg/dL  Acetaminophen level     Status: Abnormal   Collection Time: 11/12/14  1:27 AM  Result Value Ref Range   Acetaminophen (Tylenol), Serum <10 (L) 10 - 30 ug/mL    Comment:        THERAPEUTIC CONCENTRATIONS VARY SIGNIFICANTLY. A RANGE OF 10-30 ug/mL MAY BE AN EFFECTIVE CONCENTRATION FOR MANY PATIENTS. HOWEVER, SOME ARE BEST TREATED AT CONCENTRATIONS OUTSIDE THIS RANGE. ACETAMINOPHEN CONCENTRATIONS >150 ug/mL AT 4 HOURS AFTER INGESTION AND >50 ug/mL AT 12 HOURS AFTER  INGESTION ARE OFTEN ASSOCIATED WITH TOXIC REACTIONS.   CBC     Status: None   Collection Time: 11/12/14  1:27 AM  Result Value Ref Range   WBC 9.7 4.0 - 10.5 K/uL   RBC 4.39 3.87 - 5.11 MIL/uL   Hemoglobin 14.8 12.0 - 15.0 g/dL   HCT 43.1 36.0 - 46.0 %   MCV 98.2 78.0 - 100.0 fL   MCH 33.7 26.0 - 34.0 pg   MCHC 34.3 30.0 - 36.0 g/dL   RDW 12.2 11.5 - 15.5 %   Platelets 267 150 - 400 K/uL  Urine rapid drug screen (hosp performed) (Not at [facility])     Status: None   Collection Time: 11/12/14  1:52 AM  Result Value Ref Range   Opiates NONE DETECTED NONE DETECTED   Cocaine NONE DETECTED NONE DETECTED   Benzodiazepines NONE DETECTED NONE DETECTED   Amphetamines NONE DETECTED NONE DETECTED   Tetrahydrocannabinol NONE DETECTED NONE DETECTED   Barbiturates NONE DETECTED NONE DETECTED    Comment:        DRUG SCREEN FOR MEDICAL PURPOSES ONLY.  IF CONFIRMATION IS NEEDED FOR ANY PURPOSE, NOTIFY LAB WITHIN 5 DAYS.        LOWEST DETECTABLE LIMITS FOR URINE DRUG SCREEN Drug Class       Cutoff (ng/mL) Amphetamine      1000 Barbiturate      200 Benzodiazepine   427 Tricyclics       062 Opiates          300 Cocaine          300 THC              50     Current Facility-Administered Medications  Medication Dose Route Frequency Provider Last Rate Last Dose  . acetaminophen (TYLENOL) tablet 650 mg  650 mg Oral Q4H PRN Charlann Lange, PA-C   650 mg at 11/12/14 0524  . nicotine (NICODERM CQ - dosed in mg/24 hours) patch 21 mg  21 mg Transdermal Daily Charlann Lange, PA-C      .  ondansetron (ZOFRAN) tablet 4 mg  4 mg Oral Q8H PRN Charlann Lange, PA-C   4 mg at 11/12/14 3267   Current Outpatient Prescriptions  Medication Sig Dispense Refill  . albuterol (PROVENTIL HFA;VENTOLIN HFA) 108 (90 BASE) MCG/ACT inhaler Inhale 2 puffs into the lungs every 6 (six) hours as needed for wheezing. 1 Inhaler 0  . butalbital-acetaminophen-caffeine (FIORICET, ESGIC) 50-325-40 MG per tablet Take 1-2 tablets by  mouth every 6 (six) hours as needed. For neuropathy pain.  1  . clonazePAM (KLONOPIN) 0.5 MG tablet Take 1 tablet (0.5 mg total) by mouth 2 (two) times daily as needed (Anxiety, Agitation). 28 tablet 0  . Fluticasone-Salmeterol (ADVAIR) 100-50 MCG/DOSE AEPB Inhale 1 puff into the lungs 2 (two) times daily. 60 each 0  . guaiFENesin (MUCINEX) 600 MG 12 hr tablet Take 1 tablet (600 mg total) by mouth 2 (two) times daily. 6 tablet 0  . lidocaine (LIDODERM) 5 % Place 1 patch onto the skin daily as needed (for area underarm for redness/swelling/pain.). Remove & Discard patch within 12 hours or as directed by MD    . meloxicam (MOBIC) 7.5 MG tablet Take 1 tablet (7.5 mg total) by mouth daily. 30 tablet 0  . methylPREDNISolone (MEDROL) 4 MG tablet Take 4 mg by mouth daily.    . modafinil (PROVIGIL) 200 MG tablet Take 200 mg by mouth daily.  0  . pantoprazole (PROTONIX) 40 MG tablet Take 1 tablet (40 mg total) by mouth daily before breakfast. 30 tablet 1  . rizatriptan (MAXALT) 10 MG tablet Take 10 mg by mouth daily as needed. For migraine headache.  0  . topiramate (TOPAMAX) 100 MG tablet Take 1 tablet (100 mg total) by mouth daily. 30 tablet 0  . traZODone (DESYREL) 100 MG tablet Take 1 tablet (100 mg total) by mouth at bedtime as needed for sleep. 30 tablet 0  . VYVANSE 40 MG capsule Take 40 mg by mouth 2 (two) times daily.  0  . benztropine (COGENTIN) 0.5 MG tablet Take 1 tablet (0.5 mg total) by mouth at bedtime. (Patient not taking: Reported on 11/12/2014) 30 tablet 0  . nicotine (NICODERM CQ - DOSED IN MG/24 HOURS) 21 mg/24hr patch Place 1 patch (21 mg total) onto the skin daily. (Patient not taking: Reported on 11/12/2014) 28 patch 0  . risperiDONE (RISPERDAL) 0.5 MG tablet Take 5 tablets (2.5 mg total) by mouth at bedtime. (Patient not taking: Reported on 11/12/2014) 150 tablet 0  . risperiDONE (RISPERDAL) 1 MG tablet Take 1 tablet (1 mg total) by mouth daily. (Patient not taking: Reported on 11/12/2014)  30 tablet 0    Musculoskeletal: Strength & Muscle Tone: within normal limits Gait & Station: normal Patient leans: N/A  Psychiatric Specialty Exam: Review of Systems  Constitutional: Negative.   HENT: Negative.   Eyes: Negative.   Respiratory: Negative.   Cardiovascular: Negative.   Gastrointestinal: Negative.   Genitourinary: Negative.   Musculoskeletal: Negative.   Skin: Negative.   Neurological: Negative.   Endo/Heme/Allergies: Negative.     Blood pressure 121/78, pulse 97, temperature 98.6 F (37 C), temperature source Oral, resp. rate 20, SpO2 98 %.There is no weight on file to calculate BMI.  General Appearance: Casual and Disheveled  Eye Contact::  Good  Speech:  Pressured  Volume:  Normal  Mood:  Angry, Anxious and Irritable  Affect:  Congruent and Labile  Thought Process:  Circumstantial and Tangential  Orientation:  Full (Time, Place, and Person)  Thought Content:  WDL  Suicidal Thoughts:  No  Homicidal Thoughts:  No  Memory:  Immediate;   Fair Recent;   Fair Remote;   Fair  Judgement:  Poor  Insight:  Lacking  Psychomotor Activity:  Increased  Concentration:  Poor  Recall:  NA  Fund of Knowledge:Poor  Language: Fair  Akathisia:  NA  Handed:  Right  AIMS (if indicated):     Assets:  Desire for Improvement  ADL's:  Impaired  Cognition: WNL  Sleep:      Treatment Plan Summary: Daily contact with patient to assess and evaluate symptoms and progress in treatment and Medication management  Disposition: Resume all home medications and transfer to [facility].Marland Kitchen  Delfin Gant   PMHNP-BC 11/12/2014 1:11 PM

## 2014-11-12 NOTE — BH Assessment (Signed)
Per [facility], patient has a bed assignment at [facility] ([id number]). Patient accepted to [facility] by Dr. Ladona Ridgel and Julieanne Cotton, NP. Nursing report [phone]. Patient is under IVC and [employer] will provide transportation to [facility].

## 2014-11-12 NOTE — Tx Team (Addendum)
Initial Interdisciplinary Treatment Plan   PATIENT STRESSORS: Marital or family conflict Substance abuse   PATIENT STRENGTHS: Ability for insight Capable of independent living   PROBLEM LIST: Problem List/Patient Goals Date to be addressed Date deferred Reason deferred Estimated date of resolution  "Stop using drinking." 11/12/14     "Get along with my family." 11/12/14                       Increased risk for SI Reportedly, pt left voice mails stating that she wanted to be taken out her misery and that she should die and wanting someone to shoot her ( per teleasement note) 11/12/14                        DISCHARGE CRITERIA:  Ability to meet basic life and health needs Improved stabilization in mood, thinking, and/or behavior Motivation to continue treatment in a less acute level of care  PRELIMINARY DISCHARGE PLAN: Attend PHP/IOP Outpatient therapy Return to previous living arrangement  PATIENT/FAMIILY INVOLVEMENT: This treatment plan has been presented to and reviewed with the patient, Anne Black, and/or family member.  The patient and family have been given the opportunity to ask questions and make suggestions.  Bethann Punches 11/12/2014, 5:55 PM

## 2014-11-12 NOTE — Progress Notes (Signed)
Pt is 54 yr old female who was recently discharged from [facility]. Pt was IVCed  to [facility] by her daughter after having left a voice mail stating that she wanted to die and wanted some one to shoot her. During admmission, pt was very uncooperative, pt was very agitated, tearing and could not answer the question asked. On the unit, pt continue to be agitated, loud, demanding and crying. Medication was given as ordered. Pt refused the EKG. At this time pt denied SI and contracted for safety. Pt is on Q.99min checks for safety, we will continue to monitor.

## 2014-11-12 NOTE — ED Notes (Signed)
Pt brought in under IVC by sheriff department  Pt was IVC 'd by her daughter  Paperwork states the pt is bipolar and has been committed before  Pt is suicidal and left voice mails stating to take me away from this misery, I should die, shoot me  Pt does not take her medications on time  Pt has history of alcohol abuse, has been operating a motor vehicle under the influence of alcohol  Pt is talkative in triage  Pt is c/o pain in her foot and requesting a heating pad

## 2014-11-13 ENCOUNTER — Encounter (HOSPITAL_COMMUNITY): Payer: Self-pay | Admitting: Psychiatry

## 2014-11-13 ENCOUNTER — Other Ambulatory Visit: Payer: Self-pay

## 2014-11-13 DIAGNOSIS — F3163 Bipolar disorder, current episode mixed, severe, without psychotic features: Principal | ICD-10-CM

## 2014-11-13 MED ORDER — ARIPIPRAZOLE 5 MG PO TABS
5.0000 mg | ORAL_TABLET | Freq: Every evening | ORAL | Status: DC
  Administered 2014-11-13: 5 mg via ORAL
  Filled 2014-11-13 (×2): qty 1

## 2014-11-13 MED ORDER — BUTALBITAL-APAP-CAFFEINE 50-325-40 MG PO TABS
1.0000 | ORAL_TABLET | ORAL | Status: DC | PRN
  Administered 2014-11-13 – 2014-11-14 (×2): 1 via ORAL
  Filled 2014-11-13 (×2): qty 1

## 2014-11-13 MED ORDER — LAMOTRIGINE 25 MG PO TABS
25.0000 mg | ORAL_TABLET | Freq: Every day | ORAL | Status: DC
  Administered 2014-11-14 – 2014-11-16 (×3): 25 mg via ORAL
  Filled 2014-11-13 (×6): qty 1

## 2014-11-13 MED ORDER — RISPERIDONE 1 MG PO TABS
1.0000 mg | ORAL_TABLET | Freq: Every day | ORAL | Status: DC
  Administered 2014-11-14: 1 mg via ORAL
  Filled 2014-11-13 (×2): qty 1

## 2014-11-13 NOTE — [facility] Group Notes (Signed)
[facility] LCSW Aftercare Discharge Planning Group Note   11/13/2014 10:06 AM  Participation Quality:  Engaged  Mood/Affect:  Irritable  Depression Rating:  denies  Anxiety Rating:  denies  Thoughts of Suicide:  No Will you contract for safety?   NA  Current AVH:  Denies  Plan for Discharge/Comments:  "I don't know why my daughter took out paperwork on me.  I guess she is trying to make sure I never get custody of the 55 YO twins."  Later admits that she left a message for her daughter that could have been interpreted as SI.  "I may have said 'Just shoot me, would 'ja?'  It's just a saying."  States she did not fill her scripts when she left here last week.  "I still have plenty of meds at home from before.  I was just taking the ones I needed."  Initially was agreeable to having a meeting with daughter to try to problem solve around staying out of hospital, but then backtracked.  "It's nothing but lies.  I don't want anything to do with her or any other family members, including my mother.  They are all against me."  Transportation Means:   Supports:  Daryel Gerald B

## 2014-11-13 NOTE — Progress Notes (Signed)
Pt refused her EKG. " I had one done a week ago". Pt informed that this is a new order. Pt is requesting to have her EKG done at a later time.

## 2014-11-13 NOTE — [facility] Group Notes (Signed)
[facility] LCSW Group Therapy  11/13/2014 1:34 PM  Type of Therapy: Group Therapy  Participation Level: Active  Participation Quality: Attentive  Affect: Flat  Cognitive: Oriented  Insight: Limited  Engagement in Therapy: Engaged  Modes of Intervention: Discussion and Socialization  Summary of Progress/Problems: Anne Black from [facility] was here to tell his story of recovery and play his guitar. Pt was alert and attentive throughout the group. Pt participated in brief discussion and asked the speaker questions about the services that [facility] offers.   [name]. Beverely Pace 11/13/2014 1:34 PM

## 2014-11-13 NOTE — Progress Notes (Signed)
D   Pt is intrusive and monopolizes staff frequently   She has many somatic complaints and is very needy   She has trouble concentrating and staying focused   She said she does not want to take certain medications because they are making her too hungry and she doesn't want to gain weight A   Verbal support given  Set limits and allow for certain amount of time to talk with patient   Medications administered and effectiveness monitored    Q 15 min checks R    Pt safe at present

## 2014-11-13 NOTE — [facility] Counselor (Signed)
Adult Psychosocial Assessment Update Interdisciplinary Team  Previous [facility] admissions/discharges:  Admissions Discharges  Date: 9/21016 Date:11/05/14  Date: Date:  Date: Date:  Date: Date:  Date: Date:   Changes since the last Psychosocial Assessment (including adherence to outpatient mental health and/or substance abuse treatment, situational issues contributing to decompensation and/or relapse). Anne Black was cabbed home last week as she has no supports to call for help.  Her daughter IVC'd her on 11/11/14 stating that "Anne Black left a message asking daughter to take her away from this misery, I should die, just shoot me."   Daughter also states pt does not take meds as prescribed, and that she has been driving while under the influence.  Of course, Anne Black denies these allegations, saying that her family is trying to make sure that she never gets custody of her twin 52 year old daughters.  Admits that she did not fill prescriptions at d/c last week.  "I still have meds to take at home when I need them."              Discharge Plan 1. Will you be returning to the same living situation after discharge?   Yes: home No:      If no, what is your plan?    Unless pt decides she wants help for substance abuse or wants to live in an ALF where she can be monitored for meds, MS symptoms.       2. Would you like a referral for services when you are discharged? Yes:     If yes, for what services?  No:       States she was to have an appointment with [facility] outpt this week, but they call to say it would have to be rescheduled.       Summary and Recommendations (to be completed by the evaluator) Anne Black is a 54 YO Caucasian female with limited insight who has now been IVCed by family members twice in a 3 week span.  She acknowledges no issues nor challenges, stating only that her family is against her, which is the reason for both hospitalizations.  She is currently refusing to  allow [place] permission to call family or have a meeting with them.  She can benefit from crises stabilization, medication management, therapeutic milieu and referral for services.                       Signature:  Ida Rogue, 11/13/2014 10:53 AM

## 2014-11-13 NOTE — Evaluation (Signed)
Physical Therapy Evaluation-1x Patient Details Name: Anne Black MRN: [id number] DOB: May 13, 1960 Today's Date: 11/13/2014   History of Present Illness  54 yo female admitted with bipolar d/o, L foot pain. Hx of MS  Clinical Impression  On eval, pt was Mod Ind with mobility-walked ~75 feet. No LOB. Gait mildly antalgic due to L foot pain. Noted bruising, swelling L 4th, 5th toes. Instructed pt to rest, elevated, ice L foot/toes as able. No further PT needs.     Follow Up Recommendations No PT follow up    Equipment Recommendations  None recommended by PT    Recommendations for Other Services       Precautions / Restrictions Precautions Precautions: None Restrictions Weight Bearing Restrictions: No      Mobility  Bed Mobility               General bed mobility comments: pt oob   Transfers Overall transfer level: Modified independent                  Ambulation/Gait Ambulation/Gait assistance: Modified independent (Device/Increase time) Ambulation Distance (Feet): 75 Feet Assistive device: None Gait Pattern/deviations: Antalgic     General Gait Details: Gait mildly antalgic but no LOB  Stairs            Wheelchair Mobility    Modified Rankin (Stroke Patients Only)       Balance                                             Pertinent Vitals/Pain Pain Assessment: Faces Faces Pain Scale: Hurts little more Pain Location: L foot-4th and 5th toes Pain Descriptors / Indicators: Sore Pain Intervention(s): Monitored during session    Home Living Family/patient expects to be discharged to:: Private residence Living Arrangements: Alone                    Prior Function                 Hand Dominance        Extremity/Trunk Assessment               Lower Extremity Assessment: LLE deficits/detail   LLE Deficits / Details: bruising, swelling L 4th, 5th toes. Tender to palpation. Pt able to  flex/extend toes although painful  Cervical / Trunk Assessment: Normal  Communication   Communication: No difficulties  Cognition Arousal/Alertness: Awake/alert Behavior During Therapy: WFL for tasks assessed/performed Overall Cognitive Status: Within Functional Limits for tasks assessed                      General Comments      Exercises        Assessment/Plan    PT Assessment Patent does not need any further PT services  PT Diagnosis Acute pain   PT Problem List    PT Treatment Interventions     PT Goals (Current goals can be found in the Care Plan section) Acute Rehab PT Goals Patient Stated Goal: less pain PT Goal Formulation: All assessment and education complete, DC therapy    Frequency     Barriers to discharge        Co-evaluation               End of Session   Activity Tolerance: Patient tolerated treatment well Patient left:  (  ambulating into bathroom)      Functional Assessment Tool Used: clinical judgement Functional Limitation: Mobility: Walking and moving around Mobility: Walking and Moving Around Current Status [phone]): 0 percent impaired, limited or restricted Mobility: Walking and Moving Around Goal Status [phone]): 0 percent impaired, limited or restricted Mobility: Walking and Moving Around Discharge Status [phone]): 0 percent impaired, limited or restricted    Time: 1433-1445 PT Time Calculation (min) (ACUTE ONLY): 12 min   Charges:   PT Evaluation $Initial PT Evaluation Tier I: 1 Procedure     PT G Codes:   PT G-Codes **NOT FOR INPATIENT CLASS** Functional Assessment Tool Used: clinical judgement Functional Limitation: Mobility: Walking and moving around Mobility: Walking and Moving Around Current Status (A4166): 0 percent impaired, limited or restricted Mobility: Walking and Moving Around Goal Status (A6301): 0 percent impaired, limited or restricted Mobility: Walking and Moving Around Discharge Status (S0109): 0  percent impaired, limited or restricted    Rebeca Alert, MPT Pager: [phone]

## 2014-11-13 NOTE — Progress Notes (Signed)
D: Pt presents with a labile mood this evening. Pt's mood consisted of irritability, crying spells, anxiety, and depression. Pt identifies her family as a stressor to Clinical research associate. "I can't take care of them". Pt acknowledges the need to take care of herself as a priority. Pt initially refused to take her Risperdal and cogentin. "I don't need them". Per pt, she is not compliant with taking her Risperdal and Cogentin at home. Pt treats this medication as a prn. Writer voiced the importance of taking this medication as scheduled for mood stabilization. Pt reports a plan to speak with the psychiatrist in regards to this medication. Pt took a partial dose of 2mg /2.5mg . Pt denied any SI/HI/AVH. A: Writer administered scheduled and prn medications to pt, per MD orders. Continued support and availability as needed was extended to this pt. Staff continue to monitor pt with q38min checks.  R: No adverse drug reactions noted. Pt receptive to treatment. Pt remains safe at this time.

## 2014-11-13 NOTE — Tx Team (Signed)
Interdisciplinary Treatment Plan Update (Adult)  Date:  11/13/2014 Time Reviewed:  3:45 PM  Progress in Treatment: Attending groups: Yes. Participating in groups: Yes. Taking medication as prescribed:  Yes. Tolerating medication:  Yes. Family/Significant othe contact made:  Pt refused permission Patient understands diagnosis:  No  Limited insight.  Denies problems.  Blames family Discussing patient identified problems/goals with staff:  Yes, see initial care plan. Medical problems stabilized or resolved:  Yes Denies suicidal/homicidal ideation: Yes. Issues/concerns per patient self-inventory: No. Other:  New problem(s) identified:   Discharge Plan or Barriers: See below  Reason for Continuation of Hospitalization: Mania  Medication Stabilization  Comments: 11/13/14: Patient is 54 years old white female , who has a hx of Bipolar disorder , alcohol use disorder, as well as MS, presented IVC ed ,initiated by her daughter. Pt per initial notes in EHR " Pt was brought in by [employer]. Reportedly, pt left voice mails stating that she wanted to be taken out her misery and that she should die and wanting someone to shoot her. Pt denied SI while in the ED . Pt stated her daughter is "consistently and obsessively concerned with me". Pt stated she has stressors: poor relationship with her family and daughter, health problems(multiple sclerosis) and "life". Per daughter's account, pt is inconsistent with medication regimen she abuses alcohol." Abilify, Risperdal. Lamictal trial   Estimated length of stay: 4-5 days  New goal(s):  Review of initial/current patient goals per problem list:  1. Goal(s): Patient will participate in aftercare plan  Met:Yes  Target date: at discharge  As evidenced by: Patient will participate within aftercare plan AEB aftercare provider and housing plan at discharge being identified. 11/13/14: Return home, follow up [facility].   6. Goal  (s): Patient will demonstrate decreased signs of mania  * Met: No  * Target date: 3-5 days post admission date  * As evidenced by: Patient demonstrate decreased signs of mania AEB decreased mood instability and demonstration of stable mood  11/13/14: Pt presents as hyperactive and pressured with tangential thought process.  Attendees: Patient:  11/13/2014 3:45 PM  Family:   11/13/2014 3:45 PM  Physician:  Dr. Ursula Alert, MD 11/13/2014 3:45 PM  Nursing: Manuella Ghazi, RN 11/13/2014 3:45 PM  Case Manager:  Roque Lias, [place] 11/13/2014 3:45 PM  Counselor:  Matthew Saras, MSW Intern 11/13/2014 3:45 PM  Other:   11/13/2014 3:45 PM  Other:   11/13/2014 3:45 PM  Other:   11/13/2014 3:45 PM  Other:  11/13/2014 3:45 PM  Other:    Other:    Other:    Other:    Other:    Other:      Scribe for Treatment Team:   Georga Kaufmann, MSW Intern 11/13/2014 3:45 PM

## 2014-11-13 NOTE — H&P (Signed)
Psychiatric Admission Assessment Adult  [name] MRN:  [id number] Date of Evaluation:  11/13/2014 Chief Complaint:   " I am here because my family is dysfunctional.'  Principal Diagnosis: Bipolar Disorder ,  Mixed , severe with out psychotic features Diagnosis:   Patient Active Problem List   Diagnosis Date Noted  . Bipolar disorder, curr episode mixed, severe, w/o psychotic features ([place]) [F31.63] 11/13/2014  . Dyslipidemia [E78.5]   . Alcohol use disorder, moderate, dependence ([place]) [F10.20] 10/30/2014  . Noncompliance with therapeutic plan [Z91.11] 04/04/2013  . Unspecified hereditary and idiopathic peripheral neuropathy [G60.9] 05/01/2012  . GERD (gastroesophageal reflux disease) [K21.9] 05/01/2012  . Osteoarthrosis, unspecified whether generalized or localized, involving lower leg [M17.9] 05/01/2012  . MS (multiple sclerosis) ([place]) [G35] 09/14/2011   History of Present Illness::  Patient is 54 years old white female , who has a hx of Bipolar disorder , alcohol use disorder, as well as MS, presented IVC ed ,initiated by her daughter. Pt per initial notes in EHR " Pt was brought in by [employer]. Reportedly, pt left voice mails stating that she wanted to be taken out her misery and that she should die and wanting someone to shoot her. Pt denied  SI while in the ED . Pt stated her daughter is "consistently and obsessively concerned with me". Pt stated she has stressors: poor relationship with her family and daughter, health problems(multiple sclerosis) and "life". Per daughter's account, pt is inconsistent with medication regimen she abuses alcohol."  Patient seen and chart reviewed.Discussed patient with treatment team. Pt today seen as hyperactive , pressured, with tangential thought process. Pt lacks insight in to her illness. Pt continues to blame her family and her daughter for bringing her in. Pt reports she cannot find a good reason for her current  admission. Pt reports she was compliant on her medications , but she feels that she needs  her provigil the most . Pt reports sleep as fair, appetite as fair . Pt remains paranoid about her daughters , seen as flipping from topic to topic , has loose thought process.  She does report being anxious , uses her klonopin 2 -3 times a day. She denies misusing it .   Pt denies SI at this time. Denies AH/VH.  Pt appears to be in pain - complains about neuropathy as well as back pain due to her MS. Pt will follow up with her neurologist soon to get her started on a disease modifying agent .  Pt denies abusing any illicit drugs. She does use alcohol, reports taking 1-2 beers per day , denies any withdrawal sx.  Pt was recently d/c'd from [facility] and states she had an 11/15/14 with Dr. Adele Schilder at [facility] , but the appt was cancelled.     Elements: hyperactive , anxious , paranoid  Associated Signs/Symptoms: Depression Symptoms:  depressed mood, insomnia, anxiety, loss of energy/fatigue,  Denies suicidal ideations (Hypo) Manic Symptoms: patient is hyperactive , pressured , loose , tangential , paranoid Anxiety Symptoms:   Reports anxiety and some panic attacks  Psychotic Symptoms:   Denies hallucinations and does not appear internally preoccupied at  This time, but is paranoid about family. PTSD Symptoms:  Denies  Total Time spent with patient: 1 hour   Past Psychiatric History- multiple prior admissions , often under IVC . She is unsure when she was last admitted to a psychiatric hospital. Last hospitalization was in September 2016. She has been diagnosed with Bipolar  Disorder. Patient  Denies history of suicide attempts - chart indicates history of remote suicide attempt many years ago , while in college . Patient denies history of psychosis , denies history of violence .   Past Medical History:  Past Medical History  Diagnosis Date  . Multiple sclerosis ([place])   . Arthritis   .  Anxiety   . Bipolar disorder ([place])   . ADHD (attention deficit hyperactivity disorder)   . Migraine   . Osteoporosis     Past Surgical History  Procedure Laterality Date  . Cesarean section  [id number]  . Myringotomy with tube placement Bilateral   . Tonsillectomy     Family History:  Family History  Problem Relation Age of Onset  . Heart disease Mother    Social History: Divorced,  Lives alone , has three adult daughters ( triplets ) , and has twin daughters , age 25, who live with their father. She has a hx of  DUI. Denies any other legal issues  History  Alcohol Use  . 4.4 oz/week  . 4 Standard drinks or equivalent, 2 Glasses of wine, 2 Cans of beer per week    Comment: pt states she drinks every 2-3 days     History  Drug Use No    Social History   Social History  . Marital Status: Divorced    Spouse Name: N/A  . Number of Children: 5  . Years of Education: hs   Occupational History  .      on social security disability for MS   Social History Main Topics  . Smoking status: Current Every Day Smoker -- 1.50 packs/day for 20 years    Types: Cigarettes  . Smokeless tobacco: Never Used  . Alcohol Use: 4.4 oz/week    4 Standard drinks or equivalent, 2 Glasses of wine, 2 Cans of beer per week     Comment: pt states she drinks every 2-3 days  . Drug Use: No  . Sexual Activity: Not Currently   Other Topics Concern  . None   Social History Narrative   Additional Social History:    Pain Medications: See MAR  Prescriptions: See MAR  Over the Counter: See MAR  History of alcohol / drug use?: Yes Longest period of sobriety (when/how long): Unable to assess Negative Consequences of Use:  (UTA) Withdrawal Symptoms: Agitation, Irritability     Musculoskeletal: Strength & Muscle Tone: within normal limits Gait & Station: normal Patient leans: N/A  Psychiatric Specialty Exam: Physical Exam  Constitutional:  I concur with PE done in ED.    Review of Systems   Constitutional: Negative.   HENT: Negative.   Eyes: Negative.   Respiratory: Negative.   Cardiovascular: Negative.   Gastrointestinal: Negative for nausea.  Genitourinary: Negative.   Musculoskeletal: Negative.   Skin: Negative.   Neurological:       Peripheral neuropathy  Endo/Heme/Allergies: Negative.   Psychiatric/Behavioral: Positive for depression. The patient is nervous/anxious.   All other systems reviewed and are negative.   Blood pressure 115/50, pulse 63, temperature 98 F (36.7 C), temperature source Oral, resp. rate 20, height '5\' 3"'  (1.6 m), weight 91.173 kg (201 lb), SpO2 96 %.Body mass index is 35.61 kg/(m^2).  General Appearance: Disheveled  Eye Sport and exercise psychologist::  Fair  Speech:  Pressured  Volume:  Normal  Mood:  Anxious and Depressed  Affect:  Labile  Thought Process:  Goal Directed  Orientation:  Fully alert and attentive   Thought Content:  Denies hallucinations, not internally preoccupied ,   ruminates about her ex husband and family committing her to hospital. ?Paranoia towards family members  Suicidal Thoughts:  Denies and states that her family's reports of recent suicidal ideations are a lie   Homicidal Thoughts:  No  Memory:  Immediate;   Fair Recent;   Fair Remote;   Fair  Judgement:  Fair  Insight:  Lacking  Psychomotor Activity:  some restlessness   Concentration:  Poor  Recall:  [place] of Knowledge:Good  Language: Good  Akathisia:  Negative  Handed:  Right  AIMS (if indicated):     Assets:  Housing Resilience  ADL's:  Impaired  Cognition: WNL  Sleep:  Number of Hours: 6.75   Risk to Self: Is patient at risk for suicide?: No Risk to Others:   Prior Inpatient Therapy:   Prior Outpatient Therapy:    Alcohol Screening: 1. How often do you have a drink containing alcohol?: 2 to 3 times a week 2. How many drinks containing alcohol do you have on a typical day when you are drinking?: 1 or 2 3. How often do you have six or more drinks on one  occasion?: Never Preliminary Score: 0 9. Have you or someone else been injured as a result of your drinking?: No 10. Has a relative or friend or a doctor or another health worker been concerned about your drinking or suggested you cut down?: Yes, but not in the last year Alcohol Use Disorder Identification Test Final Score (AUDIT): 5  Allergies:  No Known Allergies Lab Results:  Results for orders placed or performed during the hospital encounter of 11/12/14 (from the past 48 hour(s))  Comprehensive metabolic panel     Status: Abnormal   Collection Time: 11/12/14  1:27 AM  Result Value Ref Range   Sodium 140 135 - 145 mmol/L   Potassium 3.6 3.5 - 5.1 mmol/L   Chloride 101 101 - 111 mmol/L   CO2 29 22 - 32 mmol/L   Glucose, Bld 108 (H) 65 - 99 mg/dL   BUN 6 6 - 20 mg/dL   Creatinine, Ser 0.45 0.44 - 1.00 mg/dL   Calcium 9.2 8.9 - 10.3 mg/dL   Total Protein 7.8 6.5 - 8.1 g/dL   Albumin 3.9 3.5 - 5.0 g/dL   AST 45 (H) 15 - 41 U/L   ALT 50 14 - 54 U/L   Alkaline Phosphatase 120 38 - 126 U/L   Total Bilirubin 0.2 (L) 0.3 - 1.2 mg/dL   GFR calc non Af Amer >60 >60 mL/min   GFR calc Af Amer >60 >60 mL/min    Comment: (NOTE) The eGFR has been calculated using the CKD EPI equation. This calculation has not been validated in all clinical situations. eGFR's persistently <60 mL/min signify possible Chronic Kidney Disease.    Anion gap 10 5 - 15  Ethanol (ETOH)     Status: Abnormal   Collection Time: 11/12/14  1:27 AM  Result Value Ref Range   Alcohol, Ethyl (B) 126 (H) <5 mg/dL    Comment:        LOWEST DETECTABLE LIMIT FOR SERUM ALCOHOL IS 5 mg/dL FOR MEDICAL PURPOSES ONLY   Salicylate level     Status: None   Collection Time: 11/12/14  1:27 AM  Result Value Ref Range   Salicylate Lvl <6.3 2.8 - 30.0 mg/dL  Acetaminophen level     Status: Abnormal   Collection Time: 11/12/14  1:27 AM  Result Value Ref Range   Acetaminophen (Tylenol), Serum <10 (L) 10 - 30 ug/mL    Comment:         THERAPEUTIC CONCENTRATIONS VARY SIGNIFICANTLY. A RANGE OF 10-30 ug/mL MAY BE AN EFFECTIVE CONCENTRATION FOR MANY PATIENTS. HOWEVER, SOME ARE BEST TREATED AT CONCENTRATIONS OUTSIDE THIS RANGE. ACETAMINOPHEN CONCENTRATIONS >150 ug/mL AT 4 HOURS AFTER INGESTION AND >50 ug/mL AT 12 HOURS AFTER INGESTION ARE OFTEN ASSOCIATED WITH TOXIC REACTIONS.   CBC     Status: None   Collection Time: 11/12/14  1:27 AM  Result Value Ref Range   WBC 9.7 4.0 - 10.5 K/uL   RBC 4.39 3.87 - 5.11 MIL/uL   Hemoglobin 14.8 12.0 - 15.0 g/dL   HCT 43.1 36.0 - 46.0 %   MCV 98.2 78.0 - 100.0 fL   MCH 33.7 26.0 - 34.0 pg   MCHC 34.3 30.0 - 36.0 g/dL   RDW 12.2 11.5 - 15.5 %   Platelets 267 150 - 400 K/uL  Urine rapid drug screen (hosp performed) (Not at [facility])     Status: None   Collection Time: 11/12/14  1:52 AM  Result Value Ref Range   Opiates NONE DETECTED NONE DETECTED   Cocaine NONE DETECTED NONE DETECTED   Benzodiazepines NONE DETECTED NONE DETECTED   Amphetamines NONE DETECTED NONE DETECTED   Tetrahydrocannabinol NONE DETECTED NONE DETECTED   Barbiturates NONE DETECTED NONE DETECTED    Comment:        DRUG SCREEN FOR MEDICAL PURPOSES ONLY.  IF CONFIRMATION IS NEEDED FOR ANY PURPOSE, NOTIFY LAB WITHIN 5 DAYS.        LOWEST DETECTABLE LIMITS FOR URINE DRUG SCREEN Drug Class       Cutoff (ng/mL) Amphetamine      1000 Barbiturate      200 Benzodiazepine   539 Tricyclics       767 Opiates          300 Cocaine          300 THC              50    Current Medications: Current Facility-Administered Medications  Medication Dose Route Frequency Provider Last Rate Last Dose  . ARIPiprazole (ABILIFY) tablet 5 mg  5 mg Oral QPM Letcher Schweikert, MD      . benztropine (COGENTIN) tablet 0.5 mg  0.5 mg Oral QHS Benjamine Mola, FNP   0.5 mg at 11/12/14 2126  . butalbital-acetaminophen-caffeine (FIORICET, ESGIC) 50-325-40 MG per tablet 1 tablet  1 tablet Oral Q4H PRN Ursula Alert, MD      .  clonazePAM (KLONOPIN) tablet 0.5 mg  0.5 mg Oral BID PRN Benjamine Mola, FNP   0.5 mg at 11/13/14 0636  . lamoTRIgine (LAMICTAL) tablet 25 mg  25 mg Oral Daily Jacquese Hackman, MD      . meloxicam (MOBIC) tablet 7.5 mg  7.5 mg Oral Daily Benjamine Mola, FNP   7.5 mg at 11/13/14 0804  . mometasone-formoterol (DULERA) 100-5 MCG/ACT inhaler 2 puff  2 puff Inhalation BID Benjamine Mola, FNP   2 puff at 11/13/14 [phone]  . nicotine (NICODERM CQ - dosed in mg/24 hours) patch 21 mg  21 mg Transdermal Daily Benjamine Mola, FNP   21 mg at 11/13/14 0805  . ondansetron (ZOFRAN) tablet 4 mg  4 mg Oral Q8H PRN Delfin Gant, NP      . pantoprazole (PROTONIX) EC tablet 40 mg  40 mg Oral QAC breakfast Jenny Reichmann  Johnn Hai, FNP   40 mg at 11/13/14 0634  . risperiDONE (RISPERDAL) tablet 1 mg  1 mg Oral QHS Jessy Calixte, MD      . traZODone (DESYREL) tablet 100 mg  100 mg Oral QHS PRN Benjamine Mola, FNP   100 mg at 11/12/14 2121   PTA Medications: Prescriptions prior to admission  Medication Sig Dispense Refill Last Dose  . albuterol (PROVENTIL HFA;VENTOLIN HFA) 108 (90 BASE) MCG/ACT inhaler Inhale 2 puffs into the lungs every 6 (six) hours as needed for wheezing. 1 Inhaler 0 11/11/2014 at Unknown time  . clonazePAM (KLONOPIN) 0.5 MG tablet Take 1 tablet (0.5 mg total) by mouth 2 (two) times daily as needed (Anxiety, Agitation). 28 tablet 0 11/11/2014 at Unknown time  . Fluticasone-Salmeterol (ADVAIR) 100-50 MCG/DOSE AEPB Inhale 1 puff into the lungs 2 (two) times daily. 60 each 0 11/11/2014 at Unknown time  . guaiFENesin (MUCINEX) 600 MG 12 hr tablet Take 1 tablet (600 mg total) by mouth 2 (two) times daily. 6 tablet 0 11/11/2014 at Unknown time  . lidocaine (LIDODERM) 5 % Place 1 patch onto the skin daily as needed (for area underarm for redness/swelling/pain.). Remove & Discard patch within 12 hours or as directed by MD   11/11/2014 at Unknown time  . meloxicam (MOBIC) 7.5 MG tablet Take 1 tablet (7.5 mg total) by mouth  daily. 30 tablet 0 11/11/2014 at Unknown time  . pantoprazole (PROTONIX) 40 MG tablet Take 1 tablet (40 mg total) by mouth daily before breakfast. 30 tablet 1 11/11/2014 at Unknown time  . rizatriptan (MAXALT) 10 MG tablet Take 10 mg by mouth daily as needed. For migraine headache.  0 11/07/2014  . topiramate (TOPAMAX) 100 MG tablet Take 1 tablet (100 mg total) by mouth daily. 30 tablet 0 11/11/2014 at Unknown time  . traZODone (DESYREL) 100 MG tablet Take 1 tablet (100 mg total) by mouth at bedtime as needed for sleep. 30 tablet 0 unknown  . benztropine (COGENTIN) 0.5 MG tablet Take 1 tablet (0.5 mg total) by mouth at bedtime. (Patient not taking: Reported on 11/12/2014) 30 tablet 0   . nicotine (NICODERM CQ - DOSED IN MG/24 HOURS) 21 mg/24hr patch Place 1 patch (21 mg total) onto the skin daily. (Patient not taking: Reported on 11/12/2014) 28 patch 0   . risperiDONE (RISPERDAL) 0.5 MG tablet Take 5 tablets (2.5 mg total) by mouth at bedtime. (Patient not taking: Reported on 11/12/2014) 150 tablet 0   . risperiDONE (RISPERDAL) 1 MG tablet Take 1 tablet (1 mg total) by mouth daily. (Patient not taking: Reported on 11/12/2014) 30 tablet 0     Previous Psychotropic Medications: Risperidone , neurontin , states she was taking provigil and vyvanse .   Substance Abuse History in the last 12 months:  Yes.  Chart notes indicate history of alcohol abuse, patient minimizes alcohol consumption at present- states she has been drinking 1-2 beers per day . Denies drug abuse     Consequences of Substance Abuse:  Does have a hx of DUI.  Results for orders placed or performed during the hospital encounter of 11/12/14 (from the past 72 hour(s))  Comprehensive metabolic panel     Status: Abnormal   Collection Time: 11/12/14  1:27 AM  Result Value Ref Range   Sodium 140 135 - 145 mmol/L   Potassium 3.6 3.5 - 5.1 mmol/L   Chloride 101 101 - 111 mmol/L   CO2 29 22 - 32 mmol/L   Glucose, Bld  108 (H) 65 - 99 mg/dL    BUN 6 6 - 20 mg/dL   Creatinine, Ser 0.45 0.44 - 1.00 mg/dL   Calcium 9.2 8.9 - 10.3 mg/dL   Total Protein 7.8 6.5 - 8.1 g/dL   Albumin 3.9 3.5 - 5.0 g/dL   AST 45 (H) 15 - 41 U/L   ALT 50 14 - 54 U/L   Alkaline Phosphatase 120 38 - 126 U/L   Total Bilirubin 0.2 (L) 0.3 - 1.2 mg/dL   GFR calc non Af Amer >60 >60 mL/min   GFR calc Af Amer >60 >60 mL/min    Comment: (NOTE) The eGFR has been calculated using the CKD EPI equation. This calculation has not been validated in all clinical situations. eGFR's persistently <60 mL/min signify possible Chronic Kidney Disease.    Anion gap 10 5 - 15  Ethanol (ETOH)     Status: Abnormal   Collection Time: 11/12/14  1:27 AM  Result Value Ref Range   Alcohol, Ethyl (B) 126 (H) <5 mg/dL    Comment:        LOWEST DETECTABLE LIMIT FOR SERUM ALCOHOL IS 5 mg/dL FOR MEDICAL PURPOSES ONLY   Salicylate level     Status: None   Collection Time: 11/12/14  1:27 AM  Result Value Ref Range   Salicylate Lvl <3.8 2.8 - 30.0 mg/dL  Acetaminophen level     Status: Abnormal   Collection Time: 11/12/14  1:27 AM  Result Value Ref Range   Acetaminophen (Tylenol), Serum <10 (L) 10 - 30 ug/mL    Comment:        THERAPEUTIC CONCENTRATIONS VARY SIGNIFICANTLY. A RANGE OF 10-30 ug/mL MAY BE AN EFFECTIVE CONCENTRATION FOR MANY PATIENTS. HOWEVER, SOME ARE BEST TREATED AT CONCENTRATIONS OUTSIDE THIS RANGE. ACETAMINOPHEN CONCENTRATIONS >150 ug/mL AT 4 HOURS AFTER INGESTION AND >50 ug/mL AT 12 HOURS AFTER INGESTION ARE OFTEN ASSOCIATED WITH TOXIC REACTIONS.   CBC     Status: None   Collection Time: 11/12/14  1:27 AM  Result Value Ref Range   WBC 9.7 4.0 - 10.5 K/uL   RBC 4.39 3.87 - 5.11 MIL/uL   Hemoglobin 14.8 12.0 - 15.0 g/dL   HCT 43.1 36.0 - 46.0 %   MCV 98.2 78.0 - 100.0 fL   MCH 33.7 26.0 - 34.0 pg   MCHC 34.3 30.0 - 36.0 g/dL   RDW 12.2 11.5 - 15.5 %   Platelets 267 150 - 400 K/uL  Urine rapid drug screen (hosp performed) (Not at [facility])      Status: None   Collection Time: 11/12/14  1:52 AM  Result Value Ref Range   Opiates NONE DETECTED NONE DETECTED   Cocaine NONE DETECTED NONE DETECTED   Benzodiazepines NONE DETECTED NONE DETECTED   Amphetamines NONE DETECTED NONE DETECTED   Tetrahydrocannabinol NONE DETECTED NONE DETECTED   Barbiturates NONE DETECTED NONE DETECTED    Comment:        DRUG SCREEN FOR MEDICAL PURPOSES ONLY.  IF CONFIRMATION IS NEEDED FOR ANY PURPOSE, NOTIFY LAB WITHIN 5 DAYS.        LOWEST DETECTABLE LIMITS FOR URINE DRUG SCREEN Drug Class       Cutoff (ng/mL) Amphetamine      1000 Barbiturate      200 Benzodiazepine   101 Tricyclics       751 Opiates          300 Cocaine          300 THC  50     Observation Level/Precautions:  15 minute checks    Psychotherapy:  Milieu, groups     Consultations:  As needed   Discharge Concerns: -         Psychological Evaluations:  No   Treatment Plan Summary: Daily contact with patient to assess and evaluate symptoms and progress in treatment, Medication management, Plan inpatient admission and medications as above    Patient will benefit from inpatient treatment and stabilization.  Estimated length of stay is 5-7 days.  Reviewed past medical records,treatment plan.  Will start patient on Abilify 5 mg po qpm for psychosis , cross titrate with Risperidone. Will continue Cogentin 0.5 mg po qhs for EPS. Will add Lamictal 25 mg po daily for mood stabilization. Will continue Trazodone 100 mg po qhs for sleep. Will continue to monitor vitals ,medication compliance and treatment side effects while patient is here.  Will restart home medications where indicated. Will monitor for medical issues as well as call consult as needed.  Reviewed labs cbc - wnl, cmp - AST mildly elevated , UDS - negative (see above) , EKG ( 11/02/14) - WNL , Lipid panel - hyperlipidemia ( 11/02/14) - dietician consult placed. TSH ( 11/02/14) - WNL. Hba1c-(11/02/14) -  WNL. Recreational therapy consult . CSW will start working on disposition.  Patient to participate in therapeutic milieu .         Medical Decision Making:  Review of Psycho-Social Stressors (1), Review or order clinical lab tests (1), Established Problem, Worsening (2) and Review of Medication Regimen & Side Effects (2)  I certify that inpatient services furnished can reasonably be expected to improve the patient's condition.   Kemal Amores MD 10/5/201612:24 PM

## 2014-11-13 NOTE — BHH Counselor (Signed)
Received a call from patient's ex-spouse Laconda Herskovitz) #[phone]. He wanted to know if patient was still at [facility]. Writer informed Mr. Cuadrado that patient was discharged. Mr. Schwander wanted to know if patient was transferred to another Hospital such as [facility]. Writer unable to provide such information due to HIPPA. Left a message with [facility] [place]) and she will inform patient that her spouse would like a return call.

## 2014-11-13 NOTE — Progress Notes (Signed)
DAR Note: Anne Black has been visible on the unit.  Attending groups.  Minimal interaction with peers.  She denies SI/HI or A/V hallucinations.  Self inventory not completed after much encouragement.  She reports that she has three broken toes on her left foot.  She rates the pain 8/10.  Tylenol given prn X 1 little relief.  Informed her to discuss this with the doctor and orders for PT consult ordered.  Called PT and left message about the referral.  She appears to be in no physical distress.  Q 15 minute checks maintained for safety.  Encouraged continued participation in group and unit activities.

## 2014-11-14 LAB — PROLACTIN: PROLACTIN: 26.3 ng/mL — AB (ref 4.8–23.3)

## 2014-11-14 MED ORDER — ARIPIPRAZOLE 10 MG PO TABS
10.0000 mg | ORAL_TABLET | Freq: Every evening | ORAL | Status: DC
  Filled 2014-11-14 (×4): qty 1

## 2014-11-14 MED ORDER — TRAZODONE HCL 50 MG PO TABS
125.0000 mg | ORAL_TABLET | Freq: Every day | ORAL | Status: DC
  Administered 2014-11-14: 125 mg via ORAL
  Filled 2014-11-14 (×3): qty 2.5

## 2014-11-14 MED ORDER — ACETAMINOPHEN 325 MG PO TABS
650.0000 mg | ORAL_TABLET | Freq: Four times a day (QID) | ORAL | Status: DC | PRN
  Administered 2014-11-14: 650 mg via ORAL
  Filled 2014-11-14: qty 2

## 2014-11-14 MED ORDER — BUTALBITAL-APAP-CAFFEINE 50-325-40 MG PO TABS
1.0000 | ORAL_TABLET | ORAL | Status: DC | PRN
  Administered 2014-11-14 – 2014-11-16 (×5): 1 via ORAL
  Filled 2014-11-14 (×5): qty 1

## 2014-11-14 NOTE — Progress Notes (Signed)
[facility] MD Progress Note  11/14/2014 2:14 PM Anne Black  MRN:  [id number] Subjective:  Patient states: "I did not sleep well last night.'   Objective: Pt seen and chart reviewed. Pt is alert/oriented x4, anxious, cooperative, and appropriate. As per nursing staff pt had a restless night last night , likely due to her taking fioricet in the PM hrs. Pt today continues to appear anxious ,pressured , but is better than yesterday. Pt continues to be very focussed on her Provigil and Vyvanse. Discussed with pt the risks of being on a stimulant medication given her diagnosis. Pt with very limited insight in to her illness.Pt encouraged to attend groups and participate in therapeutic milieu. Discussed the need for collateral information from family inorder start disposition planning. She reports she wants to think about this. She continues to talk about her conflict with family, especially her daughters.   Principal Problem:  Bipolar disorder, curr episode mixed, severe, w/o psychotic features (HCC) Diagnosis:   Patient Active Problem List   Diagnosis Date Noted  . Bipolar disorder, curr episode mixed, severe, w/o psychotic features (HCC) [F31.63] 11/13/2014  . Dyslipidemia [E78.5]   . Alcohol use disorder, moderate, dependence (HCC) [F10.20] 10/30/2014  . Noncompliance with therapeutic plan [Z91.11] 04/04/2013  . Unspecified hereditary and idiopathic peripheral neuropathy [G60.9] 05/01/2012  . GERD (gastroesophageal reflux disease) [K21.9] 05/01/2012  . Osteoarthrosis, unspecified whether generalized or localized, involving lower leg [M17.9] 05/01/2012  . MS (multiple sclerosis) (HCC) [G35] 09/14/2011   Total Time spent with patient: 25 minutes  Past Psychiatric History- multiple prior admissions , often under IVC . She is unsure when she was last admitted to a psychiatric hospital. Last hospitalization was in September 2016. She has been diagnosed with Bipolar Disorder. Patient Denies history  of suicide attempts - chart indicates history of remote suicide attempt many years ago , while in college . Patient denies history of psychosis , denies history of violence .  Past Medical History:  Past Medical History  Diagnosis Date  . Multiple sclerosis (HCC)   . Arthritis   . Anxiety   . Bipolar disorder (HCC)   . ADHD (attention deficit hyperactivity disorder)   . Migraine   . Osteoporosis     Past Surgical History  Procedure Laterality Date  . Cesarean section  [id number]  . Myringotomy with tube placement Bilateral   . Tonsillectomy     Family History:  Family History  Problem Relation Age of Onset  . Heart disease Mother    Social History: Divorced, Lives alone , has three adult daughters ( triplets ) , and has twin daughters , age 61, who live with their father. She has a hx of DUI. Denies any other legal issues  History  Alcohol Use  . 4.4 oz/week  . 4 Standard drinks or equivalent, 2 Glasses of wine, 2 Cans of beer per week    Comment: pt states she drinks every 2-3 days     History  Drug Use No    Social History   Social History  . Marital Status: Divorced    Spouse Name: N/A  . Number of Children: 5  . Years of Education: hs   Occupational History  .      on social security disability for MS   Social History Main Topics  . Smoking status: Current Every Day Smoker -- 1.50 packs/day for 20 years    Types: Cigarettes  . Smokeless tobacco: Never Used  . Alcohol Use: 4.4  oz/week    4 Standard drinks or equivalent, 2 Glasses of wine, 2 Cans of beer per week     Comment: pt states she drinks every 2-3 days  . Drug Use: No  . Sexual Activity: Not Currently   Other Topics Concern  . None   Social History Narrative   Additional History:    Sleep: Poor  Appetite:  Fair   Musculoskeletal: Strength & Muscle Tone: within normal limits Gait & Station: normal Patient leans: N/A   Psychiatric Specialty Exam: Physical Exam  Review of Systems   Musculoskeletal: Positive for back pain.  Psychiatric/Behavioral: The patient is nervous/anxious.   All other systems reviewed and are negative.  denies headache, denies chest pain, denies shortness of breath   Blood pressure 111/73, pulse 88, temperature 98 F (36.7 C), temperature source Oral, resp. rate 20, height  (1.6 m), weight 91.173 kg (201 lb), SpO2 96 %.Body mass index is 35.61 kg/(m^2).  General Appearance: Fairly Groomed   Patent attorney::  Good  Speech:  Pressured  Volume:  Normal   Mood:  Anxious and improving   Affect:  Labile and although improving  Thought Process:  Linear  Orientation:  Full (Time, Place, and Person)  Thought Content:  Rumination  Suicidal Thoughts:  No- denies any thoughts of hurting self or anyone else   Homicidal Thoughts:  No- denies any homicidal ideations towards anyone in particular  Memory:  recent and remote fair immediate - fair  Judgement:  Impaired  Insight:  Shallow  Psychomotor Activity:  Decreased- no  Significant  restlessness noted at this time, has back pain issues   Concentration:  Fair  Recall:  Good  Fund of Knowledge:Good  Language: Good  Akathisia:  Negative  Handed:  Right  AIMS (if indicated):     Assets:  Desire for Improvement Resilience  ADL's:  Impaired  Cognition: WNL  Sleep:  Number of Hours: 3.25     Current Medications: Current Facility-Administered Medications  Medication Dose Route Frequency Provider Last Rate Last Dose  . acetaminophen (TYLENOL) tablet 650 mg  650 mg Oral Q6H PRN Trula Frede, MD      . ARIPiprazole (ABILIFY) tablet 5 mg  5 mg Oral QPM Jomarie Longs, MD   5 mg at 11/13/14 1931  . benztropine (COGENTIN) tablet 0.5 mg  0.5 mg Oral QHS Beau Fanny, FNP   0.5 mg at 11/13/14 2125  . butalbital-acetaminophen-caffeine (FIORICET, ESGIC) 50-325-40 MG per tablet 1 tablet  1 tablet Oral Q4H PRN Jomarie Longs, MD      . clonazePAM (KLONOPIN) tablet 0.5 mg  0.5 mg Oral BID PRN Beau Fanny, FNP   0.5 mg at 11/14/14 0020  . lamoTRIgine (LAMICTAL) tablet 25 mg  25 mg Oral Daily Jomarie Longs, MD   25 mg at 11/14/14 0943  . meloxicam (MOBIC) tablet 7.5 mg  7.5 mg Oral Daily Beau Fanny, FNP   7.5 mg at 11/14/14 0942  . mometasone-formoterol (DULERA) 100-5 MCG/ACT inhaler 2 puff  2 puff Inhalation BID Beau Fanny, FNP   2 puff at 11/14/14 [phone]  . nicotine (NICODERM CQ - dosed in mg/24 hours) patch 21 mg  21 mg Transdermal Daily Beau Fanny, FNP   21 mg at 11/14/14 0943  . ondansetron (ZOFRAN) tablet 4 mg  4 mg Oral Q8H PRN Earney Navy, NP      . pantoprazole (PROTONIX) EC tablet 40 mg  40 mg Oral QAC breakfast Jonny Ruiz  Chipper Herb, FNP   40 mg at 11/14/14 0942  . risperiDONE (RISPERDAL) tablet 1 mg  1 mg Oral QHS Jomarie Longs, MD   1 mg at 11/14/14 0020  . traZODone (DESYREL) tablet 125 mg  125 mg Oral QHS Jomarie Longs, MD        Lab Results:  Results for orders placed or performed during the hospital encounter of 11/12/14 (from the past 48 hour(s))  Prolactin     Status: Abnormal   Collection Time: 11/13/14  6:47 PM  Result Value Ref Range   Prolactin 26.3 (H) 4.8 - 23.3 ng/mL    Comment: (NOTE) Performed At: [facility] [address] [place], [place] [id number] Mila Homer MD WU:[id number] Performed at [facility]     Physical Findings: AIMS: Facial and Oral Movements Muscles of Facial Expression: None, normal Lips and Perioral Area: None, normal Jaw: None, normal Tongue: None, normal,Extremity Movements Upper (arms, wrists, hands, fingers): None, normal Lower (legs, knees, ankles, toes): None, normal, Trunk Movements Neck, shoulders, hips: None, normal, Overall Severity Severity of abnormal movements (highest score from questions above): None, normal Incapacitation due to abnormal movements: None, normal Patient's awareness of abnormal movements (rate only patient's report): No Awareness, Dental Status Current  problems with teeth and/or dentures?:  (unable to assess) Does patient usually wear dentures?:  (unable to assess)  CIWA:  CIWA-Ar Total: 7 COWS:  COWS Total Score: 8   Assessment - Patient continues to have mood lability , focussed on obtaining Vyvanse , has limited insight in to her illness. Will continue to need medications and support.   Treatment Plan Summary: Daily contact with patient to assess and evaluate symptoms and progress in treatment, Medication management, Plan inpatient admission and medications as below  Will increase Abilify to 10 mg po qpm for psychosis , cross titrate with Risperidone. Will continue Cogentin 0.5 mg po qhs for EPS. Will continue Lamictal 25 mg po daily for mood stabilization. Will increase Trazodone to 125 mg po qhs for sleep. Will continue to monitor vitals ,medication compliance and treatment side effects while patient is here.  Will restart home medications where indicated. Will monitor for medical issues as well as call consult as needed.  Reviewed labs cbc - wnl, cmp - AST mildly elevated , UDS - negative (see above) , EKG ( 11/02/14) - WNL , Lipid panel - hyperlipidemia ( 11/02/14) - dietician consult placed. TSH ( 11/02/14) - WNL. Hba1c-(11/02/14) - WNL. Recreational therapy consult . CSW will start working on disposition.  Patient to participate in therapeutic milieu .     Medical Decision Making:  Established Problem, Stable/Improving (1), Review of Psycho-Social Stressors (1), Review or order clinical lab tests (1), New Problem, with no additional work-up planned (3), Review of Last Therapy Session (1), Review of Medication Regimen & Side Effects (2) and Review of New Medication or Change in Dosage (2)  Joda Braatz, MD 11/14/2014, 2:14 PM

## 2014-11-14 NOTE — [facility] Group Notes (Signed)
[facility] LCSW Group Therapy  11/14/2014 1:15 pm  Type of Therapy: Process Group Therapy  Participation Level:  Active  Participation Quality:  Appropriate  Affect:  Flat  Cognitive:  Oriented  Insight:  Improving  Engagement in Group:  Limited  Engagement in Therapy:  Limited  Modes of Intervention:  Activity, Clarification, Education, Problem-solving and Support  Summary of Progress/Problems: Today's group addressed the issue of overcoming obstacles.  Patients were asked to identify their biggest obstacle post d/c that stands in the way of their on-going success, and then problem solve as to how to manage this.  "I want to be able to walk a mile, and do pushups and situps.  I also went to get on an MS study to see if there is something more I can do.  I also need to hire some help or find a roommate who can help with things like cleaning, mowing the yard and those kinds of things."  Went on to talk about many other things, including seeing a therapist, getting custody of her twins, disease modifying treatment. "Staying out of the hospital would be nice."  Gave others advice based on what she had learned in [place].  Was sound advice.  Daryel Gerald B 11/14/2014   1:38 PM

## 2014-11-14 NOTE — Progress Notes (Signed)
Psychoeducational Group Note  Date:  11/14/2014 Time:  2358  Group Topic/Focus:  Wrap-Up Group:   The focus of this group is to help patients review their daily goal of treatment and discuss progress on daily workbooks.  Participation Level: Did Not Attend  Participation Quality:  Not Applicable  Affect:  Not Applicable  Cognitive:  Not Applicable  Insight:  Not Applicable  Engagement in Group: Not Applicable  Additional Comments:  The patient refused to attend group this evening since she was complaining of pain in her ankles and feet. The patient argued with this author prior to group about the policy of having the dayroom shut down while Karaoke took place.    Timia Casselman S 11/14/2014, 11:58 PM

## 2014-11-14 NOTE — Plan of Care (Signed)
Problem: Food- and Nutrition-Related Knowledge Deficit (NB-1.1) Goal: Nutrition education Formal process to instruct or train a patient/client in a skill or to impart knowledge to help patients/clients voluntarily manage or modify food choices and eating behavior to maintain or improve health. Outcome: Completed/Met Date Met:  11/14/14 Nutrition Education Note  RD consulted for nutrition education regarding hyperlipidemia.  Lipid Panel     Component Value Date/Time    CHOL 243* 11/02/2014 0636    TRIG 154* 11/02/2014 0636    HDL 39* 11/02/2014 0636    CHOLHDL 6.2 11/02/2014 0636    VLDL 31 11/02/2014 0636    LDLCALC 173* 11/02/2014 0636    RD provided "High Cholesterol Nutrition Therapy" handout from the Academy of Nutrition and Dietetics. Reviewed patient's dietary recall. Provided examples on ways to decrease sugar and fat intake in diet. Discouraged intake of processed foods and consumption of sugar-sweetened beverages. Encouraged fresh fruits and vegetables as well as whole grain sources of carbohydrates to maximize fiber intake. Teach back method used.  Expect fair to good compliance.  Body mass index is 35.61 kg/(m^2). Pt meets criteria for obesity based on current BMI.  Diet Order: Diet regular Room service appropriate?: Yes; Fluid consistency:: Thin Pt is also offered choice of unit snacks mid-morning and mid-afternoon.  Pt is eating as desired.   Labs and medications reviewed. No further nutrition interventions warranted at this time. If additional nutrition issues arise, please re-consult RD.  Clayton Bibles, MS, RD, LDN Pager: [phone] After Hours Pager: [phone]

## 2014-11-14 NOTE — [facility] Group Notes (Signed)
[facility] Group Notes:  (Nursing/MHT/Case Management/Adjunct)  Date:  11/14/2014  Time:  4:31 PM  Type of Therapy:  Nurse Education  Participation Level:  Active  Participation Quality:  Attentive and Monopolizing  Affect:  Depressed  Cognitive:  Alert and Appropriate  Insight:  Appropriate  Engagement in Group:  Monopolizing  Modes of Intervention:  Discussion and Education  Summary of Progress/Problems:  Group topic was Leisure and Lifestyle changes.  Discussed coping skills and how to set positive goals.  She reports that she likes to read, breath, watch TV, meditate and stretch when she is feeling stressed out.  She is hoping that she will be able to increase her exercise in a month.    Norm Parcel Saara Kijowski 11/14/2014, 4:31 PM

## 2014-11-15 ENCOUNTER — Ambulatory Visit (HOSPITAL_COMMUNITY): Payer: Self-pay | Admitting: Psychiatry

## 2014-11-15 DIAGNOSIS — E221 Hyperprolactinemia: Secondary | ICD-10-CM | POA: Clinically undetermined

## 2014-11-15 MED ORDER — TRAZODONE HCL 150 MG PO TABS
150.0000 mg | ORAL_TABLET | Freq: Every day | ORAL | Status: DC
  Administered 2014-11-15: 150 mg via ORAL
  Filled 2014-11-15 (×3): qty 1

## 2014-11-15 NOTE — Progress Notes (Signed)
  [facility] Adult Case Management Discharge Plan :  Will you be returning to the same living situation after discharge:  Yes,  home At discharge, do you have transportation home?: Yes,  says her ex-husband will pick her up Do you have the ability to pay for your medications: Yes,  insurance  Release of information consent forms completed and in the chart;  Patient's signature needed at discharge.  Patient to Follow up at: Follow-up Information    [facility] on [facility].   Why:  Arrive at 10:30am for 11:00am apt.on Wednesday with Dr Lolly Mustache.  Also, check out the [employer] at [phone].  They have groups daily   Contact information:   [address] [place] [address]       Patient denies SI/HI: Yes,  yes    Safety Planning and Suicide Prevention discussed: Yes,  yes  Have you used any form of tobacco in the last 30 days? (Cigarettes, Smokeless Tobacco, Cigars, and/or Pipes): Patient Refused Screening  Has patient been referred to the Quitline?: Yes, faxed on 11/05/14  Ida Rogue 11/15/2014, 4:12 PM

## 2014-11-15 NOTE — Progress Notes (Signed)
D: Pt who lacks insights continues to blame others. She states, "I am too smart to be here; People just lied all the time to get you in here; I have already informed my lawyer and a lot of people will get in trouble. Pt is also very argumentative and med seeking; continually asked for one med to another. Pt complained of mild L. foot of 4 on a 0-10 pain scale. Pt denies SI/HI and AVH.  A: Medications offered as prescribed.  Support, encouragement, and safe environment provided.  15-minute safety checks continue.  R: Pt was med compliant.  Pt did not attend karaoke group. Safety checks continue.

## 2014-11-15 NOTE — Plan of Care (Signed)
Problem: Diagnosis: Increased Risk For Suicide Attempt Goal: STG-Patient Will Report Suicidal Feelings to Staff Outcome: Progressing Patient denies SI Goal: STG-Patient Will Comply With Medication Regime Outcome: Progressing Patient has been med compliant.

## 2014-11-15 NOTE — [facility] Group Notes (Signed)
[facility] LCSW Aftercare Discharge Planning Group Note   11/15/2014 10:49 AM  Participation Quality:  Engaged  Mood/Affect:  Appropriate  Depression Rating:  denies  Anxiety Rating:  denies  Thoughts of Suicide:  No Will you contract for safety?   NA  Current AVH:  No  Plan for Discharge/Comments:  "I don't know why I am still here.  I'm doing fine.  My family keeps intervening and causing me to come here.  I feel like I am being violated."  Up to this point, she has been unwilling to give permission to call anyone.  I explained that the only way we can stop this cycle is if she gives me permission to talk to family so they can understand what we are capable of doing or not doing here.  She signed concsent.  Transportation Means: ex husband  Supports: neighbor, International aid/development worker, Cheval B

## 2014-11-15 NOTE — [facility] Group Notes (Signed)
Pt expressed her day was tiring.  She stated she was extremely tired because she didn't get much sleep last night and that her MS creates fatigue.  She stated she enjoyed the group with the Child psychotherapist.  Her goal was to speak to the doctor about going home and to identify a support system for when she goes home.  She expressed she wants to join a gym and be able to walk a mile within a month.  She stated she is "practicing letting go of what I can't control" and interacted with her peers.   Caroll Rancher, MHT

## 2014-11-15 NOTE — Progress Notes (Signed)
[facility] MD Progress Note  11/15/2014 1:35 PM Anne Black  MRN:  [id number] Subjective:  Patient states: "I am fine, can I leave today.'   Objective: Pt seen and chart reviewed. Pt is alert/oriented x4, anxious, cooperative, and appropriate. As per nursing staff pt had a restless night last night , likely due to her taking fioricet in the PM hrs. Pt today appears labile , but is more goal directed. Pt reports she has been trying to take all her medications as well as attending groups . Pt continues to does not feel that her family should be contacted since they are all very negative. Pt reports that she has not spoken to her dad in a year and she does not have a good relationship with her ex husband since he has custody of her two 50 year old children. Pt reports she is motivated to attend [employer] as well as to stay on her medications. Per staff - pt was restless last night and required prn medications for sleep.    Principal Problem:  Bipolar disorder, curr episode mixed, severe, w/o psychotic features (HCC) Diagnosis:   Patient Active Problem List   Diagnosis Date Noted  . Bipolar disorder, curr episode mixed, severe, w/o psychotic features (HCC) [F31.63] 11/13/2014  . Dyslipidemia [E78.5]   . Alcohol use disorder, moderate, dependence (HCC) [F10.20] 10/30/2014  . Noncompliance with therapeutic plan [Z91.11] 04/04/2013  . Unspecified hereditary and idiopathic peripheral neuropathy [G60.9] 05/01/2012  . GERD (gastroesophageal reflux disease) [K21.9] 05/01/2012  . Osteoarthrosis, unspecified whether generalized or localized, involving lower leg [M17.9] 05/01/2012  . MS (multiple sclerosis) (HCC) [G35] 09/14/2011   Total Time spent with patient: 25 minutes  Past Psychiatric History- multiple prior admissions , often under IVC . She is unsure when she was last admitted to a psychiatric hospital. Last hospitalization was in September 2016. She has been diagnosed with Bipolar Disorder.  Patient Denies history of suicide attempts - chart indicates history of remote suicide attempt many years ago , while in college . Patient denies history of psychosis , denies history of violence .  Past Medical History:  Past Medical History  Diagnosis Date  . Multiple sclerosis (HCC)   . Arthritis   . Anxiety   . Bipolar disorder (HCC)   . ADHD (attention deficit hyperactivity disorder)   . Migraine   . Osteoporosis     Past Surgical History  Procedure Laterality Date  . Cesarean section  [id number]  . Myringotomy with tube placement Bilateral   . Tonsillectomy     Family History:  Family History  Problem Relation Age of Onset  . Heart disease Mother    Social History: Divorced, Lives alone , has three adult daughters ( triplets ) , and has twin daughters , age 69, who live with their father. She has a hx of DUI. Denies any other legal issues  History  Alcohol Use  . 4.4 oz/week  . 4 Standard drinks or equivalent, 2 Glasses of wine, 2 Cans of beer per week    Comment: pt states she drinks every 2-3 days     History  Drug Use No    Social History   Social History  . Marital Status: Divorced    Spouse Name: N/A  . Number of Children: 5  . Years of Education: hs   Occupational History  .      on social security disability for MS   Social History Main Topics  . Smoking status: Current  Every Day Smoker -- 1.50 packs/day for 20 years    Types: Cigarettes  . Smokeless tobacco: Never Used  . Alcohol Use: 4.4 oz/week    4 Standard drinks or equivalent, 2 Glasses of wine, 2 Cans of beer per week     Comment: pt states she drinks every 2-3 days  . Drug Use: No  . Sexual Activity: Not Currently   Other Topics Concern  . None   Social History Narrative   Additional History:    Sleep: Fair  Appetite:  Fair   Musculoskeletal: Strength & Muscle Tone: within normal limits Gait & Station: normal Patient leans: N/A   Psychiatric Specialty Exam: Physical  Exam  Review of Systems  Musculoskeletal: Positive for back pain.  Psychiatric/Behavioral: The patient is nervous/anxious.   All other systems reviewed and are negative.  denies headache, denies chest pain, denies shortness of breath   Blood pressure 111/73, pulse 88, temperature 98 F (36.7 C), temperature source Oral, resp. rate 20, height 5\' 3"  (1.6 m), weight 91.173 kg (201 lb), SpO2 96 %.Body mass index is 35.61 kg/(m^2).  General Appearance: Fairly Groomed   Patent attorney::  Good  Speech:  Pressured improving  Volume:  Normal   Mood:  Anxious and improving   Affect:  Labile and Tearful  Thought Process:  Linear  Orientation:  Full (Time, Place, and Person)  Thought Content:  Rumination  Suicidal Thoughts:  No- denies any thoughts of hurting self or anyone else   Homicidal Thoughts:  No- denies any homicidal ideations towards anyone in particular  Memory:  recent and remote fair immediate - fair  Judgement:  Impaired  Insight:  Shallow  Psychomotor Activity:  Decreased- no  Significant  restlessness noted at this time, has back pain issues   Concentration:  Fair  Recall:  Good  Fund of Knowledge:Good  Language: Good  Akathisia:  Negative  Handed:  Right  AIMS (if indicated):     Assets:  Desire for Improvement Resilience  ADL's:  Impaired  Cognition: WNL  Sleep:  Number of Hours: 5.5     Current Medications: Current Facility-Administered Medications  Medication Dose Route Frequency Provider Last Rate Last Dose  . acetaminophen (TYLENOL) tablet 650 mg  650 mg Oral Q6H PRN Jomarie Longs, MD   650 mg at 11/14/14 1506  . ARIPiprazole (ABILIFY) tablet 10 mg  10 mg Oral QPM Jomarie Longs, MD   10 mg at 11/14/14 1841  . benztropine (COGENTIN) tablet 0.5 mg  0.5 mg Oral QHS Beau Fanny, FNP   0.5 mg at 11/14/14 2103  . butalbital-acetaminophen-caffeine (FIORICET, ESGIC) 50-325-40 MG per tablet 1 tablet  1 tablet Oral Q4H PRN Jomarie Longs, MD   1 tablet at 11/15/14 0917   . clonazePAM (KLONOPIN) tablet 0.5 mg  0.5 mg Oral BID PRN Beau Fanny, FNP   0.5 mg at 11/14/14 2103  . lamoTRIgine (LAMICTAL) tablet 25 mg  25 mg Oral Daily Jomarie Longs, MD   25 mg at 11/15/14 0915  . meloxicam (MOBIC) tablet 7.5 mg  7.5 mg Oral Daily Beau Fanny, FNP   7.5 mg at 11/15/14 0916  . mometasone-formoterol (DULERA) 100-5 MCG/ACT inhaler 2 puff  2 puff Inhalation BID Beau Fanny, FNP   2 puff at 11/15/14 0915  . nicotine (NICODERM CQ - dosed in mg/24 hours) patch 21 mg  21 mg Transdermal Daily Beau Fanny, FNP   21 mg at 11/15/14 0917  . ondansetron (ZOFRAN) tablet  4 mg  4 mg Oral Q8H PRN Earney Navy, NP   4 mg at 11/15/14 0916  . pantoprazole (PROTONIX) EC tablet 40 mg  40 mg Oral QAC breakfast Beau Fanny, FNP   40 mg at 11/15/14 0915  . traZODone (DESYREL) tablet 150 mg  150 mg Oral QHS Jomarie Longs, MD        Lab Results:  Results for orders placed or performed during the hospital encounter of 11/12/14 (from the past 48 hour(s))  Prolactin     Status: Abnormal   Collection Time: 11/13/14  6:47 PM  Result Value Ref Range   Prolactin 26.3 (H) 4.8 - 23.3 ng/mL    Comment: (NOTE) Performed At: [facility] [address] [place], [place] [id number] Mila Homer MD WU:[id number] Performed at [facility]     Physical Findings: AIMS: Facial and Oral Movements Muscles of Facial Expression: None, normal Lips and Perioral Area: None, normal Jaw: None, normal Tongue: None, normal,Extremity Movements Upper (arms, wrists, hands, fingers): None, normal Lower (legs, knees, ankles, toes): None, normal, Trunk Movements Neck, shoulders, hips: None, normal, Overall Severity Severity of abnormal movements (highest score from questions above): None, normal Incapacitation due to abnormal movements: None, normal Patient's awareness of abnormal movements (rate only patient's report): No Awareness, Dental Status Current problems  with teeth and/or dentures?:  (unable to assess) Does patient usually wear dentures?:  (unable to assess)  CIWA:  CIWA-Ar Total: 7 COWS:  COWS Total Score: 8   Assessment - Patient continues to have mood lability and tearfulness , but over all making progress. Pt will continue to need support .    Treatment Plan Summary: Daily contact with patient to assess and evaluate symptoms and progress in treatment, Medication management, Plan inpatient admission and medications as below  Will continue Abilify 10 mg po qpm for psychosis , cross titrate with Risperidone. Will continue Cogentin 0.5 mg po qhs for EPS. Will continue Lamictal 25 mg po daily for mood stabilization. Will increase Trazodone to 150 mg po qhs for sleep. Will continue to monitor vitals ,medication compliance and treatment side effects while patient is here.  Will restart home medications where indicated. Will monitor for medical issues as well as call consult as needed.  Recreational therapy consult . CSW will start working on disposition. CSW was able to contact ex husband - who reports concerns about patient's alcoholism as well as noncompliance on medications. Will provide substance abuse counseling as well as referral to substance abuse program if she is motivated. Patient to participate in therapeutic milieu .     Medical Decision Making:  Established Problem, Stable/Improving (1), Review of Psycho-Social Stressors (1), Review of Last Therapy Session (1), Review of Medication Regimen & Side Effects (2) and Review of New Medication or Change in Dosage (2)  Adelyne Marchese, MD 11/15/2014, 1:35 PM

## 2014-11-15 NOTE — [facility] Group Notes (Signed)
[facility] LCSW Aftercare Discharge Planning Group Note   11/15/2014 11:10 AM  Participation Quality:  Active  Mood/Affect:  Appropriate  Depression Rating:  Pt denies    Anxiety Rating:  Pt denies   Thoughts of Suicide:  No Will you contract for safety?   NA  Current AVH:  No  Plan for Discharge/Comments:  Pt expressed frustration about being in the hospital against her will and wanting to leave. Pt stated that she feels "violated because I am being forced to do something I don't want to do". CSW suggested that pt allow collateral contact so that a more complete picture of the situation can be given. Pt agreed and gave consent. Pt also expressed interest in outpt therapy upon discharge.  Transportation Means: Family will transport   Supports: Family and mental health providers   Jonathon Jordan

## 2014-11-15 NOTE — [facility] Suicide Risk Assessment (Signed)
[facility] INPATIENT:  Family/Significant Other Suicide Prevention Education  Suicide Prevention Education:  Patient Refusal for Family/Significant Other Suicide Prevention Education: The patient Anne Black has refused to provide written consent for family/significant other to be provided Family/Significant Other Suicide Prevention Education during admission and/or prior to discharge.  Physician notified.  Daryel Gerald B 11/15/2014, 4:10 PM

## 2014-11-15 NOTE — Progress Notes (Signed)
Patient remains disorganized and tangential. At desk with frequent requests. Wanting to discharge. States her family continues to IVC her "just to be spiteful." "My kids won't talk to me. My ex husband took them from me." Patient displays no insight into her illness. Does not believe she required hospitalization this admit. "There was no reason." States her only real issue is MS. Patient offered emotional support and reflective listening. Medicated per orders. Given fiorcet for headache with relief. Patient denies SI/HI/AVH. Will continue to redirect as needed. Lawrence Marseilles

## 2014-11-15 NOTE — [facility] Group Notes (Signed)
[facility] LCSW Group Therapy   11/15/2014 1:30 PM  Type of Therapy: Group Therapy  Participation Level:  Active  Participation Quality:  Attentive  Affect:  Flat  Cognitive:  Oriented  Insight:  Limited  Engagement in Therapy:  Engaged  Modes of Intervention:  Discussion and Socialization  Summary of Progress/Problems: Chaplain was here to lead a group on themes of hope and/or courage and love.  Pt spoke about love being the ability to trust herself. "Trusting in me is more important than trusting in others, or equally as important..but it feels very scary for me". Pt also spoke about her difficulty with setting boundaries. "Self love is respecting my own boundaries. It it no one else's job to set boundaries for me, it's my job".   When asked, responded that self love or self care for her looks like taking care of her physical self, and described goals of getting in shape.  Also talked about making a gratitude list, and saying the prayer by Georgina Pillion of [place]. "Self love is the ability to forgive.  If I forgive others, I feel better.  Otherwise it feels like a toxic thing." Jonathon Jordan 11/15/2014 1:30 PM

## 2014-11-16 MED ORDER — NICOTINE 21 MG/24HR TD PT24: 21.0000 mg | MEDICATED_PATCH | Freq: Every day | TRANSDERMAL | Status: DC

## 2014-11-16 MED ORDER — ARIPIPRAZOLE 10 MG PO TABS: 10.0000 mg | ORAL_TABLET | Freq: Every evening | ORAL | Status: DC

## 2014-11-16 MED ORDER — LAMOTRIGINE 25 MG PO TABS: 25.0000 mg | ORAL_TABLET | Freq: Every day | ORAL | Status: DC

## 2014-11-16 NOTE — [facility] Suicide Risk Assessment (Signed)
[facility] Discharge Suicide Risk Assessment   Demographic Factors:  Divorced or widowed and Caucasian  Total Time spent with patient: 30 minutes  Musculoskeletal: Strength & Muscle Tone: within normal limits Gait & Station: normal Patient leans: N/A  Psychiatric Specialty Exam: Physical Exam  Review of Systems  Psychiatric/Behavioral: Positive for substance abuse. The patient is nervous/anxious (IMPROVED).   All other systems reviewed and are negative.   Blood pressure 111/73, pulse 88, temperature 98 F (36.7 C), temperature source Oral, resp. rate 20, height  (1.6 m), weight 91.173 kg (201 lb), SpO2 96 %.Body mass index is 35.61 kg/(m^2).  General Appearance: Casual  Eye Contact::  Fair  Speech:  Clear and Coherent409  Volume:  Normal  Mood:  Euthymic  Affect:  Appropriate  Thought Process:  Circumstantial  Orientation:  Full (Time, Place, and Person)  Thought Content:  WDL  Suicidal Thoughts:  No  Homicidal Thoughts:  No  Memory:  Immediate;   Fair Recent;   Fair Remote;   Fair  Judgement:  Fair  Insight:  Shallow  Psychomotor Activity:  Normal  Concentration:  Fair  Recall:  [employer] of Knowledge:Fair  Language: Fair  Akathisia:  No  Handed:  Right  AIMS (if indicated):     Assets:  Communication Skills Desire for Improvement Social Support  Sleep:  Number of Hours: 6.75  Cognition: WNL  ADL's:  Intact   Have you used any form of tobacco in the last 30 days? (Cigarettes, Smokeless Tobacco, Cigars, and/or Pipes): Patient Refused Screening  Has this patient used any form of tobacco in the last 30 days? (Cigarettes, Smokeless Tobacco, Cigars, and/or Pipes) Yes, A prescription for nicotine patch given.    Mental Status Per Nursing Assessment::   On Admission:  Belief that plan would result in death  Current Mental Status by Physician: pt denies SI/HI/AH/VH  Loss Factors: NA  Historical Factors: Impulsivity  Risk Reduction Factors:   Positive  social support  Continued Clinical Symptoms:  Alcohol/Substance Abuse/Dependencies Previous Psychiatric Diagnoses and Treatments  Cognitive Features That Contribute To Risk:  None    Suicide Risk:  Minimal: No identifiable suicidal ideation.  Patients presenting with no risk factors but with morbid ruminations; may be classified as minimal risk based on the severity of the depressive symptoms  Principal Problem: Bipolar disorder, curr episode mixed, severe, w/o psychotic features [facility]) Discharge Diagnoses:  Patient Active Problem List   Diagnosis Date Noted  . Hyperprolactinemia (HCC) [E22.1] 11/15/2014  . Bipolar disorder, curr episode mixed, severe, w/o psychotic features (HCC) [F31.63] 11/13/2014  . Dyslipidemia [E78.5]   . Alcohol use disorder, moderate, dependence (HCC) [F10.20] 10/30/2014  . Noncompliance with therapeutic plan [Z91.11] 04/04/2013  . Unspecified hereditary and idiopathic peripheral neuropathy [G60.9] 05/01/2012  . GERD (gastroesophageal reflux disease) [K21.9] 05/01/2012  . Osteoarthrosis, unspecified whether generalized or localized, involving lower leg [M17.9] 05/01/2012  . MS (multiple sclerosis) (HCC) [G35] 09/14/2011    Follow-up Information    [facility].   Why:  Arrive at 10:30am for 11:00am apt.on Wednesday with Dr Lolly Mustache.  Also, check out [facility] [employer] at [phone].  They have groups daily   Contact information:   [address] [place] [address]       Plan Of Care/Follow-up recommendations:  Activity:  No restrictions Diet:  regular Tests:  Prolactin level needs to be checked in 3 months Other:  none  Is patient on  multiple antipsychotic therapies at discharge:  No   Has Patient had three or more failed trials of antipsychotic monotherapy by history:  No  Recommended Plan for Multiple Antipsychotic Therapies: NA    Paulo Keimig MD 11/16/2014,  9:33 AM

## 2014-11-16 NOTE — Progress Notes (Signed)
D: Pt who lacks insights continues to blame others for her admission. Pt possible d/c on Saturday endorses anxiety. She states, "The only reason am anxious now is because I could be going home tomorrow." Pt continues to be med seeking. Pt denies depression, pain, SI/HI and AVH.  A: Medications offered as prescribed.  Support, encouragement, and safe environment provided.  15-minute safety checks continue.  R: Pt was med compliant.  Pt did attend wrap-up group. Safety checks continue.

## 2014-11-16 NOTE — Discharge Summary (Signed)
Physician Discharge Summary Note  Patient:  Anne Black is an 54 y.o., female MRN:  [id number] DOB:  1960-04-10 Patient phone:  [phone] (home)  Patient address:   [phone] [place] [address],  Total Time spent with patient: 45 minutes  Date of Admission:  11/12/2014 Date of Discharge: 11/16/2014  Reason for Admission:  Patient is 54 years old white female , who has a hx of Bipolar disorder , alcohol use disorder, as well as MS, presented IVC ed ,initiated by her daughter. Pt per initial notes in EHR " Pt was brought in by [name]. Reportedly, pt left voice mails stating that she wanted to be taken out her misery and that she should die and wanting someone to shoot her. Pt denied SI while in the ED . Pt stated her daughter is "consistently and obsessively concerned with me". Pt stated she has stressors: poor relationship with her family and daughter, health problems(multiple sclerosis) and "life". Per daughter's account, pt is inconsistent with medication regimen she abuses alcohol."  Patient seen and chart reviewed.Discussed patient with treatment team. Pt today seen as hyperactive , pressured, with tangential thought process. Pt lacks insight in to her illness. Pt continues to blame her family and her daughter for bringing her in. Pt reports she cannot find a good reason for her current admission. Pt reports she was compliant on her medications , but she feels that she needs her provigil the most . Pt reports sleep as fair, appetite as fair . Pt remains paranoid about her daughters , seen as flipping from topic to topic , has loose thought process.  Pt was recently d/c'd from [facility] and states she had an 11/15/14 with Dr. Lolly Mustache at [facility] , but the appt was cancelled.   Principal Problem: Bipolar disorder, curr episode mixed, severe, w/o psychotic features [facility]) Discharge Diagnoses: Patient Active Problem List   Diagnosis Date Noted  .  Hyperprolactinemia (HCC) [E22.1] 11/15/2014  . Bipolar disorder, curr episode mixed, severe, w/o psychotic features (HCC) [F31.63] 11/13/2014  . Dyslipidemia [E78.5]   . Alcohol use disorder, moderate, dependence (HCC) [F10.20] 10/30/2014  . Noncompliance with therapeutic plan [Z91.11] 04/04/2013  . Unspecified hereditary and idiopathic peripheral neuropathy [G60.9] 05/01/2012  . GERD (gastroesophageal reflux disease) [K21.9] 05/01/2012  . Osteoarthrosis, unspecified whether generalized or localized, involving lower leg [M17.9] 05/01/2012  . MS (multiple sclerosis) (HCC) [G35] 09/14/2011    Musculoskeletal: Strength & Muscle Tone: within normal limits Gait & Station: normal Patient leans: N/A  Psychiatric Specialty Exam: Physical Exam  Constitutional: She is oriented to person, place, and time. She appears well-developed.  HENT:  Head: Normocephalic.  Eyes: Pupils are equal, round, and reactive to light.  Neck: Normal range of motion. Neck supple.  Respiratory: Effort normal.  Musculoskeletal: Normal range of motion.  Neurological: She is alert and oriented to person, place, and time.  Skin: Skin is warm and dry.  Psychiatric: She has a normal mood and affect. Her behavior is normal. Judgment and thought content normal.    Review of Systems  Musculoskeletal: Positive for myalgias (chronic), back pain (chronic) and joint pain.  All other systems reviewed and are negative.   Blood pressure 111/73, pulse 88, temperature 98 F (36.7 C), temperature source Oral, resp. rate 20, height 5\' 3"  (1.6 m), weight 91.173 kg (201 lb), SpO2 96 %.Body mass index is 35.61 kg/(m^2).  General Appearance: Fairly Groomed  Patent attorney::  Good  Speech:  Clear and Coherent and Normal  Rate  Volume:  Normal  Mood:  Anxious and Euthymic  Affect:  Appropriate and Tearful when discussing family members  Thought Process:  Circumstantial, Intact and Linear  Orientation:  Full (Time, Place, and Person)   Thought Content:  WDL  Suicidal Thoughts:  No  Homicidal Thoughts:  No  Memory:  Immediate;   Good Recent;   Fair Remote;   Fair  Judgement:  Fair  Insight:  Good  Psychomotor Activity:  Normal  Concentration:  Good  Recall:  Good  Fund of Knowledge:Fair  Language: Good  Akathisia:  No  Handed:  Right  AIMS (if indicated):     Assets:  Communication Skills Desire for Improvement Financial Resources/Insurance Housing Leisure Time Physical Health Social Support Vocational/Educational  ADL's:  Intact  Cognition: WNL  Sleep:  Number of Hours: 6.75   Have you used any form of tobacco in the last 30 days? (Cigarettes, Smokeless Tobacco, Cigars, and/or Pipes): Patient Refused Screening  Has this patient used any form of tobacco in the last 30 days? (Cigarettes, Smokeless Tobacco, Cigars, and/or Pipes) Patient accepted nicotine patches upon discharge. She notes she has signed up for the state smoking cessation program.   Past Medical History:  Past Medical History  Diagnosis Date  . Multiple sclerosis (HCC)   . Arthritis   . Anxiety   . Bipolar disorder (HCC)   . ADHD (attention deficit hyperactivity disorder)   . Migraine   . Osteoporosis     Past Surgical History  Procedure Laterality Date  . Cesarean section  [id number]  . Myringotomy with tube placement Bilateral   . Tonsillectomy     Family History:  Family History  Problem Relation Age of Onset  . Heart disease Mother    Social History:  History  Alcohol Use  . 4.4 oz/week  . 4 Standard drinks or equivalent, 2 Glasses of wine, 2 Cans of beer per week    Comment: pt states she drinks every 2-3 days     History  Drug Use No    Social History   Social History  . Marital Status: Divorced    Spouse Name: N/A  . Number of Children: 5  . Years of Education: hs   Occupational History  .      on social security disability for MS   Social History Main Topics  . Smoking status: Current Every Day Smoker  -- 1.50 packs/day for 20 years    Types: Cigarettes  . Smokeless tobacco: Never Used  . Alcohol Use: 4.4 oz/week    4 Standard drinks or equivalent, 2 Glasses of wine, 2 Cans of beer per week     Comment: pt states she drinks every 2-3 days  . Drug Use: No  . Sexual Activity: Not Currently   Other Topics Concern  . None   Social History Narrative    Past Psychiatric History: Hospitalizations: [facility] x 5x, since 2013  Outpatient Care: None  Substance Abuse Care: None  Self-Mutilation: None  Suicidal Attempts: None  Violent Behaviors: None   Risk to Self: Is patient at risk for suicide?: No Risk to Others:   Prior Inpatient Therapy:   Prior Outpatient Therapy:    Level of Care:  IOP  Hospital Course:  LEOMA BLAUSER was admitted for Bipolar disorder, curr episode mixed, severe, w/o psychotic features (HCC) and crisis management.  She was treated discharged with the medications listed below under Medication List.  Medical problems were identified and treated as  needed.  Home medications were restarted as appropriate.  Improvement was monitored by observation and [name] daily report of symptom reduction.  Emotional and mental status was monitored by daily self-inventory reports completed by [name] and clinical staff.         SAYSHA MENTA was evaluated by the treatment team for stability and plans for continued recovery upon discharge.  [name] motivation was an integral factor for scheduling further treatment.  Employment, transportation, bed availability, health status, family support, and any pending legal issues were also considered during her hospital stay. She was offered further treatment options upon discharge including but not limited to Residential, Intensive Outpatient, and Outpatient treatment.  DESTINI CAMBRE will follow up with the services as listed below under Follow Up Information.     Upon completion of this admission the patient was both  mentally and medically stable for discharge denying suicidal/homicidal ideation, auditory/visual/tactile hallucinations, delusional thoughts and paranoia.      Consults:  None  Significant Diagnostic Studies:  None  Discharge Vitals:   Blood pressure 111/73, pulse 88, temperature 98 F (36.7 C), temperature source Oral, resp. rate 20, height  (1.6 m), weight 91.173 kg (201 lb), SpO2 96 %. Body mass index is 35.61 kg/(m^2). Lab Results:   Results for orders placed or performed during the hospital encounter of 11/12/14 (from the past 72 hour(s))  Prolactin     Status: Abnormal   Collection Time: 11/13/14  6:47 PM  Result Value Ref Range   Prolactin 26.3 (H) 4.8 - 23.3 ng/mL    Comment: (NOTE) Performed At: [facility] [address] [place], [place] [id number] Mila Homer MD WU:[id number] Performed at [facility]     Physical Findings: AIMS: Facial and Oral Movements Muscles of Facial Expression: None, normal Lips and Perioral Area: None, normal Jaw: None, normal Tongue: None, normal,Extremity Movements Upper (arms, wrists, hands, fingers): None, normal Lower (legs, knees, ankles, toes): None, normal, Trunk Movements Neck, shoulders, hips: None, normal, Overall Severity Severity of abnormal movements (highest score from questions above): None, normal Incapacitation due to abnormal movements: None, normal Patient's awareness of abnormal movements (rate only patient's report): No Awareness, Dental Status Current problems with teeth and/or dentures?:  (unable to assess) Does patient usually wear dentures?:  (unable to assess)  CIWA:  CIWA-Ar Total: 7 COWS:  COWS Total Score: 8   See Psychiatric Specialty Exam and Suicide Risk Assessment completed by Attending Physician prior to discharge.  Discharge destination:  Home, Family member is coming to pick her up.   Is patient on multiple antipsychotic therapies at discharge:  No   Has Patient  had three or more failed trials of antipsychotic monotherapy by history:  No  Recommended Plan for Multiple Antipsychotic Therapies: NA     Medication List    ASK your doctor about these medications      Indication   albuterol 108 (90 BASE) MCG/ACT inhaler  Commonly known as:  PROVENTIL HFA;VENTOLIN HFA  Inhale 2 puffs into the lungs every 6 (six) hours as needed for wheezing.      benztropine 0.5 MG tablet  Commonly known as:  COGENTIN  Take 1 tablet (0.5 mg total) by mouth at bedtime.   Indication:  EPS prophylactic     clonazePAM 0.5 MG tablet  Commonly known as:  KLONOPIN  Take 1 tablet (0.5 mg total) by mouth 2 (two) times daily as needed (Anxiety, Agitation).   Indication:  anxiety     Fluticasone-Salmeterol 100-50 MCG/DOSE Aepb  Commonly known as:  ADVAIR  Inhale 1 puff into the lungs 2 (two) times daily.   Indication:  Asthma     guaiFENesin 600 MG 12 hr tablet  Commonly known as:  MUCINEX  Take 1 tablet (600 mg total) by mouth 2 (two) times daily.   Indication:  Cough     lidocaine 5 %  Commonly known as:  LIDODERM  Place 1 patch onto the skin daily as needed (for area underarm for redness/swelling/pain.). Remove & Discard patch within 12 hours or as directed by MD      meloxicam 7.5 MG tablet  Commonly known as:  MOBIC  Take 1 tablet (7.5 mg total) by mouth daily.   Indication:  Joint Damage causing Pain and Loss of Function     nicotine 21 mg/24hr patch  Commonly known as:  NICODERM CQ - dosed in mg/24 hours  Place 1 patch (21 mg total) onto the skin daily.   Indication:  Nicotine Addiction     pantoprazole 40 MG tablet  Commonly known as:  PROTONIX  Take 1 tablet (40 mg total) by mouth daily before breakfast.   Indication:  Gastroesophageal Reflux Disease     risperiDONE 1 MG tablet  Commonly known as:  RISPERDAL  Take 1 tablet (1 mg total) by mouth daily.   Indication:  mood stabilization     risperiDONE 0.5 MG tablet  Commonly known as:   RISPERDAL  Take 5 tablets (2.5 mg total) by mouth at bedtime.   Indication:  mood stabilization     rizatriptan 10 MG tablet  Commonly known as:  MAXALT  Take 10 mg by mouth daily as needed. For migraine headache.      topiramate 100 MG tablet  Commonly known as:  TOPAMAX  Take 1 tablet (100 mg total) by mouth daily.   Indication:  Migraine Headache     traZODone 100 MG tablet  Commonly known as:  DESYREL  Take 1 tablet (100 mg total) by mouth at bedtime as needed for sleep.   Indication:  Trouble Sleeping           Follow-up Information    [facility]2014.   Why:  Arrive at 10:30am for 11:00am apt.on Wednesday with Dr Lolly Mustache.  Also, check out the [facility] at [phone].  They have groups daily   Contact information:   [address] [place] [address]       Follow-up recommendations:  Activity:  Increase activity as tolerated Diet:  Regular diet.  Tests:  Will need a prolactin level recheck in 3 months.   Comments:  Take all medications as prescribed. Keep all follow-up appointments as scheduled.  Do not consume alcohol or use illegal drugs while on prescription medications. Report any adverse effects from your medications to your primary care provider promptly.  In the event of recurrent symptoms or worsening symptoms, call 911, a crisis hotline, or go to the nearest emergency department for evaluation.  Total Discharge Time: Greater than 45 minutes Signed: Truman Hayward FNP-BC 11/16/2014, 11:37 AM

## 2014-11-16 NOTE — [facility] Group Notes (Signed)
[facility] Group Notes:  (Clinical Social Work)  11/16/2014  11:15-12:00PM  Summary of Progress/Problems:   The main focus of today's process group was to discuss patients' feelings related to being hospitalized, as well as the difference between "being" and "having" a mental health diagnosis.  It was agreed in general by the group that it would be preferable to avoid future hospitalizations, and we discussed means of doing that.  The patient expressed her primary feeling about being hospitalized is good because it has given her a chance to increase her self-awareness, which she believes leads to better self-acceptance.  She reported being here 3-4 weeks ago, not wanting to return.  Type of Therapy:  Group Therapy - Process  Participation Level:  Active  Participation Quality:  Attentive, Sharing and Supportive  Affect:  Blunted  Cognitive:  Alert, Appropriate and Oriented  Insight:  Engaged  Engagement in Therapy:  Engaged  Modes of Intervention:  Exploration, Discussion  Ambrose Mantle, LCSW 11/16/2014, 12:26 PM

## 2014-12-11 ENCOUNTER — Telehealth: Payer: Self-pay

## 2014-12-11 ENCOUNTER — Ambulatory Visit (HOSPITAL_COMMUNITY): Payer: Self-pay | Admitting: Psychiatry

## 2014-12-11 NOTE — Telephone Encounter (Signed)
ERROR

## 2014-12-21 ENCOUNTER — Ambulatory Visit (HOSPITAL_BASED_OUTPATIENT_CLINIC_OR_DEPARTMENT_OTHER): Payer: Managed Care, Other (non HMO)

## 2014-12-25 NOTE — Progress Notes (Signed)
Informed by CSW [name] about patient running out of medications and her insurance calling us to get it refilled . Pt has an out patient appointment scheduled in December 2016.  Called [name] at [phone] [facility] - discussed this with her about this . Per [name] - pt did not show up for her last appointment and hence is on a cancel waitlist. However she will call her and make arrangements for her to see Child psychiatrist at [facility] - however if pt does not show up again - then they will refer her to another out patient and will not see her again.   Jomarie Longs ,MD Attending Psychiatrist  [facility]

## 2014-12-26 ENCOUNTER — Telehealth (HOSPITAL_COMMUNITY): Payer: Self-pay

## 2014-12-26 NOTE — Telephone Encounter (Signed)
Telephone call with patient to follow up on a message she left she was in need of enough Klonopin  to last until she comes in to see Dr.Tadepalli on 12/31/14.  Patient stated she was not happy her Klonopin was decreased when inpatient and would like someone to write her enough medication until she sees Dr. Rutherford Limerick on 12/31/14. Arranged for patient to be seen on 12/27/14 at 11:00am by Dr. Rutherford Limerick and informed patient several time she had to be in at 10:00am to complete paperwork to be seen by Dr. Rutherford Limerick as a new patient in the outpatient department.  Informed patient this nurse could not guarantee Dr.Tadepalli would write new orders for medications she is requesting but she can discuss this further at evaluation.  Patient agreed to to be at the appointment at 10:00am to have evaluation at 11:00am.

## 2014-12-27 ENCOUNTER — Ambulatory Visit (INDEPENDENT_AMBULATORY_CARE_PROVIDER_SITE_OTHER): Payer: Private Health Insurance - Indemnity | Admitting: Psychiatry

## 2014-12-27 ENCOUNTER — Encounter (HOSPITAL_COMMUNITY): Payer: Self-pay | Admitting: Psychiatry

## 2014-12-27 VITALS — BP 128/74 | HR 81 | Ht 62.0 in | Wt 207.8 lb

## 2014-12-27 DIAGNOSIS — F1910 Other psychoactive substance abuse, uncomplicated: Secondary | ICD-10-CM

## 2014-12-27 DIAGNOSIS — F3163 Bipolar disorder, current episode mixed, severe, without psychotic features: Secondary | ICD-10-CM

## 2014-12-27 DIAGNOSIS — Z91199 Patient's noncompliance with other medical treatment and regimen due to unspecified reason: Secondary | ICD-10-CM

## 2014-12-27 DIAGNOSIS — G35D Multiple sclerosis, unspecified: Secondary | ICD-10-CM

## 2014-12-27 DIAGNOSIS — G35 Multiple sclerosis: Secondary | ICD-10-CM

## 2014-12-27 DIAGNOSIS — F1020 Alcohol dependence, uncomplicated: Secondary | ICD-10-CM

## 2014-12-27 DIAGNOSIS — F1510 Other stimulant abuse, uncomplicated: Secondary | ICD-10-CM | POA: Diagnosis not present

## 2014-12-27 DIAGNOSIS — Z9111 Patient's noncompliance with dietary regimen: Secondary | ICD-10-CM

## 2014-12-27 DIAGNOSIS — Z720 Tobacco use: Secondary | ICD-10-CM

## 2014-12-27 NOTE — Progress Notes (Addendum)
Psychiatric Initial Adult Assessment   Patient Identification: RAGINA FENTER MRN:  [id number] Date of Evaluation:  12/27/2014 Referral Source: [facility] H adult inpatient unit Chief Complaint:   I need my Klonopin Visit Diagnosis:    ICD-9-CM ICD-10-CM   1. Bipolar disorder, curr episode mixed, severe, w/o psychotic features (HCC) 296.63 F31.63   2. Alcohol use disorder, moderate, dependence (HCC) 303.90 F10.20   3. Stimulant abuse 305.90 F15.10   4. Nicotine abuse 305.1 F19.10   5. MS (multiple sclerosis) (HCC) 340 G35   6. Noncompliance with therapeutic plan V15.81 Z91.11    Diagnosis:   Patient Active Problem List   Diagnosis Date Noted  . Stimulant abuse [F15.10] 12/27/2014    Priority: High  . Nicotine abuse [F19.10] 12/27/2014    Priority: High  . Alcohol use disorder, moderate, dependence (HCC) [F10.20] 10/30/2014    Priority: High  . Bipolar disorder, curr episode mixed, severe, w/o psychotic features (HCC) [F31.63] 11/13/2014    Priority: Low  . Hyperprolactinemia (HCC) [E22.1] 11/15/2014  . Dyslipidemia [E78.5]   . Noncompliance with therapeutic plan [Z91.11] 04/04/2013  . Unspecified hereditary and idiopathic peripheral neuropathy [G60.9] 05/01/2012  . GERD (gastroesophageal reflux disease) [K21.9] 05/01/2012  . Osteoarthrosis, unspecified whether generalized or localized, involving lower leg [M17.9] 05/01/2012  . MS (multiple sclerosis) (HCC) [G35] 09/14/2011   History of Present Illness: 54 year old white divorced female mother of 5 children discharge from the inpatient unit where she had been hospitalized from 11/12/2014 2 11/16/2014. Patient states that her 55 year old triplet IVC at her because of noncompliance with medication and patient abusing alcohol.   Patient was discharged on Risperdal 1 mg a.m. and 2.5 mg p.m., Cogentin 0.5 mg twice a day, Klonopin 0.5 mg twice a day, trazodone 100 mg daily at bedtime, Topamax 100 mg daily at bedtime. Maxalt 10 mg every day,  protonic's, mobile and Mucinex and fluticasone. And albuterol inhaler.  Patient today presents stating that she cannot remember what she is taking but she definitely states that she stopped taking the Risperdal and the Cogentin the trazodone and the Topamax only wants Klonopin and reports that she was diagnosed with ADHD and has been treated with Adderall Vyvanse and Provigil and now is requesting provider Provigil.  Patient was very argumentative insisting that I give her the Klonopin and Provigil prescription and that she would not take any other medications because she did not feel she needed them. Discussed with her that based on her diagnoses and the medications that have been prescribed for her she would be getting them but patient refused. Stating that she knows what her body needs and that the doctors do not know what she needs.  She states that her primary care physician at [facility] physicians had prescribed stimulant and diagnosed her with ADHD and she has been tried on Adderall Vyvanse but now likes Provigil. She also states that she likes Klonopin and that's only medication that she will take.  Reports that her sleep is poor, it is disturbed, appetite is good mood is more dysphoric and anxious. States she may have a panic attack in a day. Denies feeling hopeless or helpless denies anhedonia denies suicidal or homicidal ideation and has no hallucinations or delusions.  Patient would not give me more history and was completely focused on getting the medications from me.   . At this time I requested Vikki Ports N  to join as as patient was very argumentative and would not listen to what I was saying.  Shawn and I discussed the ACTT team be involved in her treatment because of her memory issues and forgetfulness and noncompliance patient stated she did not want anyone coming into her house . It was discussed in great detail with her that if she does not follow the treatment recommendation it will  be hard for [place] to provide services for her. Patient stated that since her neurologist is at [facility] she would find a psychiatrist there.  Substance Abuse History in the last 12 months:  Yes.  Patient states she smokes 1-1/2 pack of cigarettes per day uses 2 beers and 2 wines every 2 days. Denies using marijuana or other street drugs. Patient has a history of stimulant abuse and appears to have seen multiple physicians to obtain medications.  Consequences of Substance Abuse: Medical Consequences:  Memory problems Family Consequences:  Conflict with her family patient has been IVC by her children for treatment at the hospital   Associated Signs/Symptoms Depression Symptoms:  insomnia, impaired memory, anxiety, (Hypo) Manic Symptoms:  Irritable Mood, Labiality of Mood, Anxiety Symptoms:  Generalized anxiety Psychotic Symptoms:  None PTSD Symptoms: NA  Past psychiatric history-patient has been hospitalized on the behavioral inpatient unit 5 times. Patient has been to multiple provider discharges l in [place] and [address] area. . She has also seen Dr. Evelene Croon in the past.has also been at [facility]. Patient appears to have seen multiple physicians.  Past Medical History.: Reviewed and as correct Past Medical History  Diagnosis Date  . Multiple sclerosis (HCC)   . Arthritis   . Anxiety   . Bipolar disorder (HCC)   . ADHD (attention deficit hyperactivity disorder)   . Migraine   . Osteoporosis     Past Surgical History  Procedure Laterality Date  . Cesarean section  [id number]  . Myringotomy with tube placement Bilateral   . Tonsillectomy     Family History: Patient states her father was an alcoholic and maternal grandmother was depressed Family History  Problem Relation Age of Onset  . Heart disease Mother    Social History: Lives alone in [place] she is divorced and has one set of triplets for 55 years old and a set of twins were 27   Social History   Social History  .  Marital Status: Divorced    Spouse Name: N/A  . Number of Children: 5  . Years of Education: hs   Occupational History  .      on social security disability for MS   Social History Main Topics  . Smoking status: Current Every Day Smoker -- 0.75 packs/day for 20 years    Types: Cigarettes  . Smokeless tobacco: Never Used  . Alcohol Use: 4.8 oz/week    4 Standard drinks or equivalent, 2 Glasses of wine, 2 Cans of beer per week     Comment: pt states she drinks every 2-3 days  . Drug Use: No  . Sexual Activity: Not Currently   Other Topics Concern  . None   Social History Narrative   Additional Social History. 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  Psychiatric Specialty Exam: HPI  Review of Systems  Constitutional: Negative.   Eyes: Negative.   Respiratory: Negative.   Cardiovascular: Negative.   Gastrointestinal: Negative.   Genitourinary: Negative.   Musculoskeletal: Negative.  Skin: Negative.   Neurological: Positive for headaches. Negative for dizziness, tremors, sensory change, speech change, focal weakness and seizures.  Endo/Heme/Allergies: Negative.   Psychiatric/Behavioral: Positive for depression and substance abuse. The patient is nervous/anxious and has insomnia.     Blood pressure 128/74, pulse 81, height 5\' 2"  (1.575 m), weight 207 lb 12.8 oz (94.257 kg).Body mass index is 38 kg/(m^2).  General Appearance: Casual  Eye Contact:  Good  Speech:  Clear and Coherent and Normal Rate  Volume:  Normal  Mood:  Irritable  Affect:  Appropriate  Thought Process:  Goal Directed and Linear  Orientation:  Full (Time, Place, and Person)  Thought Content:  Rumination  Suicidal Thoughts:  No  Homicidal Thoughts:  No  Memory:  Immediate;   Poor Recent;   Poor Remote;    Poor  Judgement:  Impaired  Insight:  Lacking  Psychomotor Activity:  Decreased  Concentration:  Poor  Recall:  Poor  Fund of Knowledge:Fair  Language: Good  Akathisia:  No  Handed:  Right  AIMS (if indicated):  0  Assets:  Media planner  ADL's:  Intact  Cognition: WNL  Sleep:  disturbed   Is the patient at risk to self?  No. Has the patient been a risk to self in the past 6 months?  Yes.   Has the patient been a risk to self within the distant past?  Yes.   Is the patient a risk to others?  No. Has the patient been a risk to others in the past 6 months?  No. Has the patient been a risk to others within the distant past?  No.  Allergies:  No known allergies Current Medications: Current Outpatient Prescriptions  Medication Sig Dispense Refill  . albuterol (PROVENTIL HFA;VENTOLIN HFA) 108 (90 BASE) MCG/ACT inhaler Inhale 2 puffs into the lungs every 6 (six) hours as needed for wheezing. 1 Inhaler 0  . clonazePAM (KLONOPIN) 0.5 MG tablet Take 1 tablet (0.5 mg total) by mouth 2 (two) times daily as needed (Anxiety, Agitation). 28 tablet 0  . meloxicam (MOBIC) 7.5 MG tablet Take 1 tablet (7.5 mg total) by mouth daily. 30 tablet 0  . rizatriptan (MAXALT) 10 MG tablet Take 10 mg by mouth daily as needed. For migraine headache.  0  . ARIPiprazole (ABILIFY) 10 MG tablet Take 1 tablet (10 mg total) by mouth every evening. (Patient not taking: Reported on 12/27/2014) 30 tablet 0  . benztropine (COGENTIN) 0.5 MG tablet Take 1 tablet (0.5 mg total) by mouth at bedtime. (Patient not taking: Reported on 11/12/2014) 30 tablet 0  . Fluticasone-Salmeterol (ADVAIR) 100-50 MCG/DOSE AEPB Inhale 1 puff into the lungs 2 (two) times daily. (Patient not taking: Reported on 12/27/2014) 60 each 0  . guaiFENesin (MUCINEX) 600 MG 12 hr tablet Take 1 tablet (600 mg total) by mouth 2 (two) times daily. (Patient not taking: Reported on 12/27/2014) 6 tablet 0  .  lamoTRIgine (LAMICTAL) 25 MG tablet Take 1 tablet (25 mg total) by mouth daily. (Patient not taking: Reported on 12/27/2014) 30 tablet 0  . lidocaine (LIDODERM) 5 % Place 1 patch onto the skin daily as needed (for area underarm for redness/swelling/pain.). Remove & Discard patch within 12 hours or as directed by MD    . nicotine (NICODERM CQ - DOSED IN MG/24 HOURS) 21 mg/24hr patch Place 1 patch (21 mg total) onto the skin daily. (Patient not taking: Reported on 11/12/2014) 28 patch 0  . nicotine (NICODERM CQ -  DOSED IN MG/24 HOURS) 21 mg/24hr patch Place 1 patch (21 mg total) onto the skin daily. (Patient not taking: Reported on 12/27/2014) 28 patch 0  . pantoprazole (PROTONIX) 40 MG tablet Take 1 tablet (40 mg total) by mouth daily before breakfast. 30 tablet 1  . risperiDONE (RISPERDAL) 0.5 MG tablet Take 5 tablets (2.5 mg total) by mouth at bedtime. (Patient not taking: Reported on 11/12/2014) 150 tablet 0  . risperiDONE (RISPERDAL) 1 MG tablet Take 1 tablet (1 mg total) by mouth daily. (Patient not taking: Reported on 11/12/2014) 30 tablet 0  . topiramate (TOPAMAX) 100 MG tablet Take 1 tablet (100 mg total) by mouth daily. (Patient not taking: Reported on 12/27/2014) 30 tablet 0  . traZODone (DESYREL) 100 MG tablet Take 1 tablet (100 mg total) by mouth at bedtime as needed for sleep. (Patient not taking: Reported on 12/27/2014) 30 tablet 0   No current facility-administered medications for this visit.    Previous Psychotropic Medications: Yes     Medical Decision Making:  Review of Psycho-Social Stressors (1), Review or order clinical lab tests (1), Review and summation of old records (2), Established Problem, Worsening (2), Review of Last Therapy Session (1) and Review of Medication Regimen & Side Effects (2)  Treatment Plan Summary: Medication management Plan #1 bipolar disorder depressed Patient is refusing to take the medications prescribed to her at the hospital which include Risperdal,  Cogentin, trazodone, Topamax,. Patient wants Klonopin 0.5 3 times a day and is also requesting Provigil. #2 polysubstance abuse Patient has been abusing alcohol, benzodiazepines and stimulants. #3 Everlene Balls RN and I discussed involving the ACTT team to help her keep track of her medications and coordinate care but patient refused. Patient was also given an option of going to our [facility] and or [name] to help her with case management but she refused. She stated that since she was connected with [facility]  she would find her psychiatrist there. And left the office, stating she did not want to return. Discussed with her that her case would be closed and she is comfortable with that. This was a 60 minute visit, of high intensity initial assessment more than 50% of the time was spent in discussing substance abuse and trying to obtain history and trying to help the patient understand the need to take the correct medications while she argued. Also to coordinate care with the ACT team but patient refused. Hence her case is being closed. At this time she is not a suicidal or homicidal risk and she has not hallucinating and not delusional.  Margit Banda 11/18/201611:45 AM

## 2014-12-31 ENCOUNTER — Ambulatory Visit (HOSPITAL_COMMUNITY): Payer: Self-pay | Admitting: Psychiatry

## 2015-01-17 ENCOUNTER — Ambulatory Visit (HOSPITAL_COMMUNITY): Payer: Self-pay | Admitting: Psychiatry

## 2015-01-21 ENCOUNTER — Telehealth: Payer: Self-pay | Admitting: General Practice

## 2015-01-21 NOTE — Telephone Encounter (Signed)
Returned call to patient after request for call back.  Pt attempting to schedule an appt after being discharged from our practice 07/19/2012.  I informed patient since she had been discharged she is unable to schedule an appt with any of our providers.  Pt asked for the reason she was discharged and I informed her she had been discharged for noncompliance and verbal abuse toward staff.  Pt stated she did not agree with that in her opinion.  Pt states she had been dealing with a lot of issues personally for the last 3 years and she has been working on her verbal abuse issues and thanked me for the call back.

## 2015-02-20 ENCOUNTER — Emergency Department (HOSPITAL_COMMUNITY)
Admission: EM | Admit: 2015-02-20 | Discharge: 2015-02-21 | Disposition: A | Discharge status: Other Acute Inpatient Hospital | Payer: Managed Care, Other (non HMO) | Attending: Emergency Medicine | Admitting: Emergency Medicine

## 2015-02-20 ENCOUNTER — Emergency Department (HOSPITAL_COMMUNITY): Payer: Managed Care, Other (non HMO)

## 2015-02-20 ENCOUNTER — Encounter (HOSPITAL_COMMUNITY): Payer: Self-pay

## 2015-02-20 DIAGNOSIS — R4182 Altered mental status, unspecified: Secondary | ICD-10-CM | POA: Insufficient documentation

## 2015-02-20 DIAGNOSIS — F17210 Nicotine dependence, cigarettes, uncomplicated: Secondary | ICD-10-CM | POA: Diagnosis not present

## 2015-02-20 DIAGNOSIS — Z8669 Personal history of other diseases of the nervous system and sense organs: Secondary | ICD-10-CM | POA: Diagnosis not present

## 2015-02-20 DIAGNOSIS — Z9119 Patient's noncompliance with other medical treatment and regimen: Secondary | ICD-10-CM | POA: Insufficient documentation

## 2015-02-20 DIAGNOSIS — F10120 Alcohol abuse with intoxication, uncomplicated: Secondary | ICD-10-CM | POA: Insufficient documentation

## 2015-02-20 DIAGNOSIS — F319 Bipolar disorder, unspecified: Secondary | ICD-10-CM | POA: Diagnosis not present

## 2015-02-20 DIAGNOSIS — F3163 Bipolar disorder, current episode mixed, severe, without psychotic features: Secondary | ICD-10-CM | POA: Diagnosis not present

## 2015-02-20 DIAGNOSIS — Z791 Long term (current) use of non-steroidal anti-inflammatories (NSAID): Secondary | ICD-10-CM | POA: Insufficient documentation

## 2015-02-20 DIAGNOSIS — Z9111 Patient's noncompliance with dietary regimen: Secondary | ICD-10-CM

## 2015-02-20 DIAGNOSIS — F1110 Opioid abuse, uncomplicated: Secondary | ICD-10-CM | POA: Diagnosis not present

## 2015-02-20 DIAGNOSIS — G43909 Migraine, unspecified, not intractable, without status migrainosus: Secondary | ICD-10-CM | POA: Insufficient documentation

## 2015-02-20 DIAGNOSIS — F10129 Alcohol abuse with intoxication, unspecified: Secondary | ICD-10-CM | POA: Diagnosis present

## 2015-02-20 DIAGNOSIS — Z91199 Patient's noncompliance with other medical treatment and regimen due to unspecified reason: Secondary | ICD-10-CM

## 2015-02-20 DIAGNOSIS — F10920 Alcohol use, unspecified with intoxication, uncomplicated: Secondary | ICD-10-CM

## 2015-02-20 DIAGNOSIS — Z79899 Other long term (current) drug therapy: Secondary | ICD-10-CM | POA: Insufficient documentation

## 2015-02-20 DIAGNOSIS — M1990 Unspecified osteoarthritis, unspecified site: Secondary | ICD-10-CM | POA: Insufficient documentation

## 2015-02-20 DIAGNOSIS — F419 Anxiety disorder, unspecified: Secondary | ICD-10-CM | POA: Insufficient documentation

## 2015-02-20 LAB — COMPREHENSIVE METABOLIC PANEL
ALBUMIN: 4.1 g/dL (ref 3.5–5.0)
ALT: 113 U/L — ABNORMAL HIGH (ref 14–54)
AST: 162 U/L — AB (ref 15–41)
Alkaline Phosphatase: 117 U/L (ref 38–126)
Anion gap: 20 — ABNORMAL HIGH (ref 5–15)
BILIRUBIN TOTAL: 1.7 mg/dL — AB (ref 0.3–1.2)
CHLORIDE: 97 mmol/L — AB (ref 101–111)
CO2: 23 mmol/L (ref 22–32)
Calcium: 8.9 mg/dL (ref 8.9–10.3)
Creatinine, Ser: 0.68 mg/dL (ref 0.44–1.00)
GFR calc Af Amer: >60 mL/min (ref >60)
GFR calc non Af Amer: >60 mL/min (ref >60)
Glucose, Bld: 94 mg/dL (ref 65–99)
POTASSIUM: 3.8 mmol/L (ref 3.5–5.1)
Sodium: 140 mmol/L (ref 135–145)
Total Protein: 7.5 g/dL (ref 6.5–8.1)

## 2015-02-20 LAB — CBC WITH DIFFERENTIAL/PLATELET
BASOS ABS: 0.1 10*3/uL (ref 0.0–0.1)
Basophils Relative: 1 %
EOS PCT: 0 %
Eosinophils Absolute: 0 10*3/uL (ref 0.0–0.7)
HEMATOCRIT: 45.4 % (ref 36.0–46.0)
Hemoglobin: 14.7 g/dL (ref 12.0–15.0)
LYMPHS ABS: 1.9 10*3/uL (ref 0.7–4.0)
LYMPHS PCT: 32 %
MCH: 33.7 pg (ref 26.0–34.0)
MCHC: 32.4 g/dL (ref 30.0–36.0)
MCV: 104.1 fL — ABNORMAL HIGH (ref 78.0–100.0)
MONO ABS: 0.4 10*3/uL (ref 0.1–1.0)
Monocytes Relative: 6 %
NEUTROS ABS: 3.6 10*3/uL (ref 1.7–7.7)
Neutrophils Relative %: 61 %
PLATELETS: 225 10*3/uL (ref 150–400)
RBC: 4.36 MIL/uL (ref 3.87–5.11)
RDW: 16 % — AB (ref 11.5–15.5)
WBC: 6 10*3/uL (ref 4.0–10.5)

## 2015-02-20 LAB — RAPID URINE DRUG SCREEN, HOSP PERFORMED
Amphetamines: NOT DETECTED
BENZODIAZEPINES: NOT DETECTED
Barbiturates: NOT DETECTED
COCAINE: NOT DETECTED
OPIATES: POSITIVE — AB
Tetrahydrocannabinol: NOT DETECTED

## 2015-02-20 LAB — ETHANOL: Alcohol, Ethyl (B): 313 mg/dL (ref <5)

## 2015-02-20 MED ORDER — ONDANSETRON HCL 4 MG PO TABS
4.0000 mg | ORAL_TABLET | Freq: Three times a day (TID) | ORAL | Status: DC | PRN
  Administered 2015-02-20: 4 mg via ORAL
  Filled 2015-02-20: qty 1

## 2015-02-20 MED ORDER — LORAZEPAM 1 MG PO TABS
0.0000 mg | ORAL_TABLET | Freq: Four times a day (QID) | ORAL | Status: DC
  Administered 2015-02-20 – 2015-02-21 (×2): 2 mg via ORAL
  Filled 2015-02-20: qty 3
  Filled 2015-02-20: qty 2

## 2015-02-20 MED ORDER — IBUPROFEN 200 MG PO TABS
600.0000 mg | ORAL_TABLET | Freq: Three times a day (TID) | ORAL | Status: DC | PRN
  Administered 2015-02-20: 600 mg via ORAL
  Filled 2015-02-20: qty 3

## 2015-02-20 MED ORDER — ACETAMINOPHEN 325 MG PO TABS: 650.0000 mg | ORAL_TABLET | ORAL | Status: DC | PRN

## 2015-02-20 MED ORDER — VITAMIN B-1 100 MG PO TABS
100.0000 mg | ORAL_TABLET | Freq: Every day | ORAL | Status: DC
  Administered 2015-02-20 (×2): 100 mg via ORAL
  Filled 2015-02-20 (×2): qty 1

## 2015-02-20 MED ORDER — PANTOPRAZOLE SODIUM 40 MG PO TBEC: 40.0000 mg | DELAYED_RELEASE_TABLET | Freq: Every day | ORAL | Status: DC

## 2015-02-20 MED ORDER — ALUM & MAG HYDROXIDE-SIMETH 200-200-20 MG/5ML PO SUSP
30.0000 mL | ORAL | Status: DC | PRN
  Filled 2015-02-20: qty 30

## 2015-02-20 MED ORDER — PANTOPRAZOLE SODIUM 40 MG PO TBEC
40.0000 mg | DELAYED_RELEASE_TABLET | Freq: Once | ORAL | Status: AC
  Administered 2015-02-20: 40 mg via ORAL
  Filled 2015-02-20: qty 1

## 2015-02-20 MED ORDER — LORAZEPAM 1 MG PO TABS: 0.0000 mg | ORAL_TABLET | Freq: Two times a day (BID) | ORAL | Status: DC

## 2015-02-20 MED ORDER — THIAMINE HCL 100 MG/ML IJ SOLN: 100.0000 mg | Freq: Every day | INTRAMUSCULAR | Status: DC

## 2015-02-20 NOTE — ED Notes (Signed)
Patient appears anxious and indecisive. Reports anxiety and clamminess.

## 2015-02-20 NOTE — BH Assessment (Signed)
Pt is willing to go to the observation unit. Support paperwork has been completed. Informed Dr. Rubin Payor that pt has agreed to go to the observation unit. Pt nurse made aware as well.

## 2015-02-20 NOTE — BH Assessment (Signed)
Tele Assessment Note   Anne Black is an 55 y.o. female presenting to [facility] intoxicated. It has been reported that pt was found outside of the courthouse screaming and yelling earlier today. Pt stated "I am under a lot of stress". "I had court today so I called my ex-husband for a ride today". Pt denies SI, HI and AVH at this time. Pt did not report any previous suicide attempts or self-injurious behaviors. Pt reported that she is dealing with multiple stressors such as her head and DWI charges. Pt reported multiple depressive symptoms and shared that her sleep has been inconsistent. Pt did not report any issues with her appetite but reported weight gain over the past 3 years. Pt did not report any current mental health treatment but reported that she contacted a therapist and will schedule an appointment. Pt's reported that she drunk 2 scotches today and stated "I had 2 scotches because I was going to court. Pt was offered detox but she declined and stated "I don't think I need to stay for detox".  Pt does not meet inpatient criteria at this time.   Diagnosis: Alcohol intoxication with moderate or severe use disorder.  Past Medical History:  Past Medical History  Diagnosis Date  . Multiple sclerosis (HCC)   . Arthritis   . Anxiety   . Bipolar disorder (HCC)   . ADHD (attention deficit hyperactivity disorder)   . Migraine   . Osteoporosis     Past Surgical History  Procedure Laterality Date  . Cesarean section  [id number]  . Myringotomy with tube placement Bilateral   . Tonsillectomy      Family History:  Family History  Problem Relation Age of Onset  . Heart disease Mother     Social History:  reports that she has been smoking Cigarettes.  She has a 15 pack-year smoking history. She has never used smokeless tobacco. She reports that she drinks about 4.8 oz of alcohol per week. She reports that she does not use illicit drugs.  Additional Social History:  Alcohol / Drug  Use History of alcohol / drug use?: Yes Substance #1 Name of Substance 1: Alcohol  1 - Age of First Use: 20's  1 - Amount (size/oz): "2 scotches"  1 - Frequency: daily  1 - Duration: ongoing  1 - Last Use / Amount: 02-20-15 BAL:313  CIWA: CIWA-Ar BP: 123/83 mmHg Pulse Rate: 93 Nausea and Vomiting: no nausea and no vomiting Tactile Disturbances: none Tremor: no tremor Auditory Disturbances: not present Paroxysmal Sweats: no sweat visible Visual Disturbances: not present Anxiety: two Headache, Fullness in Head: none present Agitation: normal activity Orientation and Clouding of Sensorium: oriented and can do serial additions CIWA-Ar Total: 2 COWS:    PATIENT STRENGTHS: (choose at least two) Average or above average intelligence Capable of independent living    Allergies:  Allergies  Allergen Reactions  . Meloxicam Other (See Comments)    Bad stomach    Home Medications:  (Not in a hospital admission)  OB/GYN Status:  No LMP recorded. Patient is postmenopausal.  General Assessment Data Location of Assessment: [facility] ED TTS Assessment: In system Is this a Tele or Face-to-Face Assessment?: Face-to-Face Is this an Initial Assessment or a Re-assessment for this encounter?: Initial Assessment Marital status: Divorced Living Arrangements: Alone Can pt return to current living arrangement?: Yes Admission Status: Voluntary Is patient capable of signing voluntary admission?: Yes Referral Source: Self/Family/Friend Insurance type: AETNA     Crisis Care Plan Living Arrangements:  Alone Name of Psychiatrist: No provider reported.  Name of Therapist: No provider reportedl  Education Status Is patient currently in school?: No Current Grade: N/A Highest grade of school patient has completed: Some [employer] Name of school: N/A Contact person: N/A  Risk to self with the past 6 months Suicidal Ideation: No Has patient been a risk to self within the past 6 months prior to  admission? : No Suicidal Intent: No Has patient had any suicidal intent within the past 6 months prior to admission? : No Is patient at risk for suicide?: No Suicidal Plan?: No Has patient had any suicidal plan within the past 6 months prior to admission? : No Access to Means: No What has been your use of drugs/alcohol within the last 12 months?: Daily alcohol use reported.  Previous Attempts/Gestures: No How many times?: 0 Other Self Harm Risks: Pt denies Triggers for Past Attempts: None known Intentional Self Injurious Behavior: None Family Suicide History: No Recent stressful life event(s): Legal Issues (DWI charges ) Persecutory voices/beliefs?: No Depression: Yes Depression Symptoms: Insomnia, Fatigue, Guilt, Feeling worthless/self pity, Feeling angry/irritable Substance abuse history and/or treatment for substance abuse?: Yes Suicide prevention information given to non-admitted patients: Not applicable  Risk to Others within the past 6 months Homicidal Ideation: No Does patient have any lifetime risk of violence toward others beyond the six months prior to admission? : No Thoughts of Harm to Others: No Current Homicidal Intent: No Current Homicidal Plan: No Access to Homicidal Means: No Identified Victim: N/A History of harm to others?: No Assessment of Violence: None Noted Violent Behavior Description: No violent behaviors observed. Pt is calm and cooperative at this time.  Does patient have access to weapons?: No Criminal Charges Pending?: Yes Describe Pending Criminal Charges: DWI Does patient have a court date: Yes Court Date:  (02-20-15. Unknown date ) Is patient on probation?: No  Psychosis Hallucinations: None noted Delusions: None noted  Mental Status Report Appearance/Hygiene: In scrubs Eye Contact: Good Motor Activity: Freedom of movement Speech: Logical/coherent Level of Consciousness: Quiet/awake Mood: Irritable Affect: Irritable Anxiety Level:  Moderate Thought Processes: Coherent, Relevant Judgement: Impaired Orientation: Appropriate for developmental age Obsessive Compulsive Thoughts/Behaviors: None  Cognitive Functioning Concentration: Fair Memory: Remote Intact, Recent Intact IQ: Average Insight: Fair Impulse Control: Poor Appetite: Good Weight Loss: 0 Weight Gain: 160 (Over 3 years ) Sleep: Decreased Total Hours of Sleep: 5 ("up and down every couple of hours" ) Vegetative Symptoms: None  ADLScreening [facility] Assessment Services) Patient's cognitive ability adequate to safely complete daily activities?: Yes Patient able to express need for assistance with ADLs?: Yes Independently performs ADLs?: Yes (appropriate for developmental age)  Prior Inpatient Therapy Prior Inpatient Therapy: Yes Prior Therapy Dates: 2015, 10/2014, 11/2014 Prior Therapy Facilty/Provider(s): [facility]  Reason for Treatment: Alcohol use  Prior Outpatient Therapy Prior Outpatient Therapy: No Does patient have an ACCT team?: No Does patient have Intensive In-House Services?  : No Does patient have [name] services? : No Does patient have [facility] services?: No  ADL Screening (condition at time of admission) Patient's cognitive ability adequate to safely complete daily activities?: Yes Is the patient deaf or have difficulty hearing?: No Does the patient have difficulty seeing, even when wearing glasses/contacts?: No Does the patient have difficulty concentrating, remembering, or making decisions?: No Patient able to express need for assistance with ADLs?: Yes Does the patient have difficulty dressing or bathing?: No Independently performs ADLs?: Yes (appropriate for developmental age)       Abuse/Neglect Assessment (  Assessment to be complete while patient is alone) Physical Abuse: Denies Verbal Abuse: Yes, past (Comment) Sexual Abuse: Denies Exploitation of patient/patient's resources: Denies Self-Neglect: Denies     Merchant navy officer  (For Healthcare) Does patient have an advance directive?: No    Additional Information 1:1 In Past 12 Months?: No CIRT Risk: No Elopement Risk: No Does patient have medical clearance?: Yes     Disposition:  Disposition Initial Assessment Completed for this Encounter: Yes Disposition of Patient: Treatment offered and refused, Outpatient treatment Type of outpatient treatment: Chemical Dependence - Intensive Outpatient Type of treatment offered and refused: In-patient  British Moyd S 02/20/2015 8:31 PM

## 2015-02-20 NOTE — ED Notes (Signed)
Patient denies SI, HI, AVH. C/o abd pain and discomfort. C/o anxiety. Inquiring about her bal.   Encouragement offered. Given Motrin, Zofran, Protonix.   Q 15 safety checks continue.

## 2015-02-20 NOTE — ED Notes (Signed)
Pharmacy tech will in to do med rec.

## 2015-02-20 NOTE — ED Provider Notes (Addendum)
CSN: [id number]     Arrival date & time 02/20/15  1338 History   First MD Initiated Contact with Patient 02/20/15 1548     Chief Complaint  Patient presents with  . Alcohol Intoxication  . Pain     Level 5 caveat due to intoxication and psychiatric disorder. Patient is a 55 y.o. female presenting with intoxication. The history is provided by the patient and the spouse.  Alcohol Intoxication   patient presents for altered mental status and alcohol intoxication. History provided by patient and by her ex-husband. Patient reportedly had court today for a DUI. She was confused and altered mental status. Reportedly has been drinking all day and into last night. Has been drinking scotch. Patient states she does not remember anything. Has a history of alcohol abuse and bipolar disorder. Does apparently have a history of some memory issues with this too. Patient cannot tell me what happened. States she is not sure of her bipolar diagnosis she thinks it is something that her daughters told other people she had.  Past Medical History  Diagnosis Date  . Multiple sclerosis (HCC)   . Arthritis   . Anxiety   . Bipolar disorder (HCC)   . ADHD (attention deficit hyperactivity disorder)   . Migraine   . Osteoporosis    Past Surgical History  Procedure Laterality Date  . Cesarean section  [id number]  . Myringotomy with tube placement Bilateral   . Tonsillectomy     Family History  Problem Relation Age of Onset  . Heart disease Mother    Social History  Substance Use Topics  . Smoking status: Current Every Day Smoker -- 0.75 packs/day for 20 years    Types: Cigarettes  . Smokeless tobacco: Never Used  . Alcohol Use: 4.8 oz/week    4 Standard drinks or equivalent, 2 Glasses of wine, 2 Cans of beer per week     Comment: pt states she drinks every 2-3 days   OB History    No data available     Review of Systems  Unable to perform ROS: Psychiatric disorder      Allergies  Review of  patient's allergies indicates no known allergies.  Home Medications   Prior to Admission medications   Medication Sig Start Date End Date Taking? Authorizing Provider  albuterol (PROVENTIL HFA;VENTOLIN HFA) 108 (90 BASE) MCG/ACT inhaler Inhale 2 puffs into the lungs every 6 (six) hours as needed for wheezing. 02/28/14  Yes Tiffany L Reed, DO  pantoprazole (PROTONIX) 40 MG tablet Take 1 tablet (40 mg total) by mouth daily before breakfast. 11/05/14  Yes Beau Fanny, FNP  ARIPiprazole (ABILIFY) 10 MG tablet Take 1 tablet (10 mg total) by mouth every evening. Patient not taking: Reported on 12/27/2014 11/16/14   Truman Hayward, FNP  benztropine (COGENTIN) 0.5 MG tablet Take 1 tablet (0.5 mg total) by mouth at bedtime. Patient not taking: Reported on 11/12/2014 11/05/14   Beau Fanny, FNP  clonazePAM (KLONOPIN) 0.5 MG tablet Take 1 tablet (0.5 mg total) by mouth 2 (two) times daily as needed (Anxiety, Agitation). 11/05/14   Beau Fanny, FNP  Fluticasone-Salmeterol (ADVAIR) 100-50 MCG/DOSE AEPB Inhale 1 puff into the lungs 2 (two) times daily. Patient not taking: Reported on 12/27/2014 11/05/14   Beau Fanny, FNP  guaiFENesin (MUCINEX) 600 MG 12 hr tablet Take 1 tablet (600 mg total) by mouth 2 (two) times daily. Patient not taking: Reported on 12/27/2014 11/05/14   Beau Fanny, FNP  lamoTRIgine (LAMICTAL) 25 MG tablet Take 1 tablet (25 mg total) by mouth daily. Patient not taking: Reported on 12/27/2014 11/16/14   Truman Hayward, FNP  lidocaine (LIDODERM) 5 % Place 1 patch onto the skin daily as needed (for area underarm for redness/swelling/pain.). Remove & Discard patch within 12 hours or as directed by MD    Historical Provider, MD  meloxicam (MOBIC) 7.5 MG tablet Take 1 tablet (7.5 mg total) by mouth daily. 11/05/14   Beau Fanny, FNP  nicotine (NICODERM CQ - DOSED IN MG/24 HOURS) 21 mg/24hr patch Place 1 patch (21 mg total) onto the skin daily. Patient not taking: Reported on  11/12/2014 11/05/14   Beau Fanny, FNP  nicotine (NICODERM CQ - DOSED IN MG/24 HOURS) 21 mg/24hr patch Place 1 patch (21 mg total) onto the skin daily. Patient not taking: Reported on 12/27/2014 11/16/14   Truman Hayward, FNP  risperiDONE (RISPERDAL) 0.5 MG tablet Take 5 tablets (2.5 mg total) by mouth at bedtime. Patient not taking: Reported on 11/12/2014 11/05/14   Beau Fanny, FNP  risperiDONE (RISPERDAL) 1 MG tablet Take 1 tablet (1 mg total) by mouth daily. Patient not taking: Reported on 11/12/2014 11/05/14   Beau Fanny, FNP  rizatriptan (MAXALT) 10 MG tablet Take 10 mg by mouth daily as needed. For migraine headache. 09/05/14   Historical Provider, MD  topiramate (TOPAMAX) 100 MG tablet Take 1 tablet (100 mg total) by mouth daily. Patient not taking: Reported on 12/27/2014 11/05/14   Beau Fanny, FNP  traZODone (DESYREL) 100 MG tablet Take 1 tablet (100 mg total) by mouth at bedtime as needed for sleep. Patient not taking: Reported on 12/27/2014 11/05/14   Everardo All Withrow, FNP   BP 142/81 mmHg  Pulse 93  Temp(Src) 97.8 F (36.6 C) (Oral)  Resp 20  SpO2 91% Physical Exam  Constitutional: She is oriented to person, place, and time. She appears well-developed.  HENT:  Head: Atraumatic.  Cardiovascular: Normal rate.   Pulmonary/Chest: Effort normal. She has no wheezes.  Abdominal: Soft. There is no tenderness.  Neurological: She is alert and oriented to person, place, and time.  Skin: Skin is warm.  Psychiatric:  Patient appears intoxicated    ED Course  Procedures (including critical care time) Labs Review Labs Reviewed  COMPREHENSIVE METABOLIC PANEL - Abnormal; Notable for the following:    Chloride 97 (*)    BUN <5 (*)    AST 162 (*)    ALT 113 (*)    Total Bilirubin 1.7 (*)    Anion gap 20 (*)    All other components within normal limits  ETHANOL - Abnormal; Notable for the following:    Alcohol, Ethyl (B) 313 (*)    All other components within normal limits   CBC WITH DIFFERENTIAL/PLATELET - Abnormal; Notable for the following:    MCV 104.1 (*)    RDW 16.0 (*)    All other components within normal limits  URINE RAPID DRUG SCREEN, HOSP PERFORMED    Imaging Review Dg Chest 2 View  02/20/2015  CLINICAL DATA:  Cough.  Alcohol intoxication. EXAM: CHEST  2 VIEW COMPARISON:  04/06/2013 FINDINGS: The heart size and mediastinal contours are within normal limits. Both lungs are clear. The visualized skeletal structures are unremarkable. IMPRESSION: No active cardiopulmonary disease. Electronically Signed   By: Myles Rosenthal M.D.   On: 02/20/2015 16:43   I have personally reviewed and evaluated these images and lab results as part  of my medical decision-making.   EKG Interpretation None      MDM   Final diagnoses:  Alcohol intoxication, uncomplicated (HCC)  Bipolar disorder, curr episode mixed, severe, w/o psychotic features (HCC)  Noncompliance with therapeutic plan    Patient with alcohol intoxication and altered mental status.. Reportedly has not been taking any medicines. History of bipolar disorder. She was drunk to go to her DUI court case today. Has not been dealing with the swelling home. She appears to be a risk to herself and is not managing well at home. Discussed with her ex-husband. Old psychiatric notes reviewed. This point I think she needs a psychiatric evaluation. She does not appear to understand the significance of her diseases. She is voluntary at this time but if she attempts to leave a IVC her.    Benjiman Core, MD 02/20/15 1750  Patient is seen by TTS. She does not want any treatment for alcohol. She's been cleared from a psychiatric standpoint. She does not meet any inpatient criteria. She does not want further treatment at this time. She is not acutely suicidal or homicidal. Will discharge home.  Benjiman Core, MD 02/20/15 2103  I discussed with TTS again. Patient now states the patient has agreed to go over to  observation at [facility] for her alcohol withdrawal. Transfer put in.  Benjiman Core, MD 02/20/15 [phone]

## 2015-02-20 NOTE — BH Assessment (Signed)
Assessment completed. Consulted Hulan Fess, NP who agrees that pt does not meet inpatient criteria. Informed Dr. Rubin Payor of the recommendation.

## 2015-02-20 NOTE — ED Notes (Signed)
Pt presents with c/o alcohol intoxication. Pt was at the courthouse today for a court date for a DUI. Pt was found outside of the courthouse screaming and yelling and obviously intoxicated. Pt admitted that she was drinking this morning. Pt reports pain all over her body. Per EMS, her husband wants her evaluated for psychiatric issues.

## 2015-02-20 NOTE — Discharge Instructions (Signed)
Alcohol Intoxication Alcohol intoxication occurs when the amount of alcohol that a person has consumed impairs his or her ability to mentally and physically function. Alcohol directly impairs the normal chemical activity of the brain. Drinking large amounts of alcohol can lead to changes in mental function and behavior, and it can cause many physical effects that can be harmful.  Alcohol intoxication can range in severity from mild to very severe. Various factors can affect the level of intoxication that occurs, such as the person's age, gender, weight, frequency of alcohol consumption, and the presence of other medical conditions (such as diabetes, seizures, or heart conditions). Dangerous levels of alcohol intoxication may occur when people drink large amounts of alcohol in a short period (binge drinking). Alcohol can also be especially dangerous when combined with certain prescription medicines or "recreational" drugs. SIGNS AND SYMPTOMS Some common signs and symptoms of mild alcohol intoxication include:  Loss of coordination.  Changes in mood and behavior.  Impaired judgment.  Slurred speech. As alcohol intoxication progresses to more severe levels, other signs and symptoms will appear. These may include:  Vomiting.  Confusion and impaired memory.  Slowed breathing.  Seizures.  Loss of consciousness. DIAGNOSIS  Your health care provider will take a medical history and perform a physical exam. You will be asked about the amount and type of alcohol you have consumed. Blood tests will be done to measure the concentration of alcohol in your blood. In many places, your blood alcohol level must be lower than 80 mg/dL (1.61%) to legally drive. However, many dangerous effects of alcohol can occur at much lower levels.  TREATMENT  People with alcohol intoxication often do not require treatment. Most of the effects of alcohol intoxication are temporary, and they go away as the alcohol naturally  leaves the body. Your health care provider will monitor your condition until you are stable enough to go home. Fluids are sometimes given through an IV access tube to help prevent dehydration.  HOME CARE INSTRUCTIONS  Do not drive after drinking alcohol.  Stay hydrated. Drink enough water and fluids to keep your urine clear or pale yellow. Avoid caffeine.   Only take over-the-counter or prescription medicines as directed by your health care provider.  SEEK MEDICAL CARE IF:   You have persistent vomiting.   You do not feel better after a few days.  You have frequent alcohol intoxication. Your health care provider can help determine if you should see a substance use treatment counselor. SEEK IMMEDIATE MEDICAL CARE IF:   You become shaky or tremble when you try to stop drinking.   You shake uncontrollably (seizure).   You throw up (vomit) blood. This may be bright red or may look like black coffee grounds.   You have blood in your stool. This may be bright red or may appear as a black, tarry, bad smelling stool.   You become lightheaded or faint.  MAKE SURE YOU:   Understand these instructions.  Will watch your condition.  Will get help right away if you are not doing well or get worse.   This information is not intended to replace advice given to you by your health care provider. Make sure you discuss any questions you have with your health care provider.   Document Released: 11/04/2004 Document Revised: 09/27/2012 Document Reviewed: 06/30/2012 Elsevier Interactive Patient Education 2016 [employer] Bipolar Disorder Bipolar disorder is a mental illness. The term bipolar disorder actually is used to describe a group of disorders that  all share varying degrees of emotional highs and lows that can interfere with daily functioning, such as work, school, or relationships. Bipolar disorder also can lead to drug abuse, hospitalization, and suicide. The emotional highs of  bipolar disorder are periods of elation or irritability and high energy. These highs can range from a mild form (hypomania) to a severe form (mania). People experiencing episodes of hypomania may appear energetic, excitable, and highly productive. People experiencing mania may behave impulsively or erratically. They often make poor decisions. They may have difficulty sleeping. The most severe episodes of mania can involve having very distorted beliefs or perceptions about the world and seeing or hearing things that are not real (psychotic delusions and hallucinations).  The emotional lows of bipolar disorder (depression) also can range from mild to severe. Severe episodes of bipolar depression can involve psychotic delusions and hallucinations. Sometimes people with bipolar disorder experience a state of mixed mood. Symptoms of hypomania or mania and depression are both present during this mixed-mood episode. SIGNS AND SYMPTOMS There are signs and symptoms of the episodes of hypomania and mania as well as the episodes of depression. The signs and symptoms of hypomania and mania are similar but vary in severity. They include:  Inflated self-esteem or feeling of increased self-confidence.  Decreased need for sleep.  Unusual talkativeness (rapid or pressured speech) or the feeling of a need to keep talking.  Sensation of racing thoughts or constant talking, with quick shifts between topics that may or may not be related (flight of ideas).  Decreased ability to focus or concentrate.  Increased purposeful activity, such as work, studies, or social activity, or nonproductive activity, such as pacing, squirming and fidgeting, or finger and toe tapping.  Impulsive behavior and use of poor judgment, resulting in high-risk activities, such as having unprotected sex or spending excessive amounts of money. Signs and symptoms of depression include the following:   Feelings of sadness, hopelessness, or  helplessness.  Frequent or uncontrollable episodes of crying.  Lack of feeling anything or caring about anything.  Difficulty sleeping or sleeping too much.  Inability to enjoy the things you used to enjoy.   Desire to be alone all the time.   Feelings of guilt or worthlessness.  Lack of energy or motivation.   Difficulty concentrating, remembering, or making decisions.  Change in appetite or weight beyond normal fluctuations.  Thoughts of death or the desire to harm yourself. DIAGNOSIS  Bipolar disorder is diagnosed through an assessment by your caregiver. Your caregiver will ask questions about your emotional episodes. There are two main types of bipolar disorder. People with type I bipolar disorder have manic episodes with or without depressive episodes. People with type II bipolar disorder have hypomanic episodes and major depressive episodes, which are more serious than mild depression. The type of bipolar disorder you have can make an important difference in how your illness is monitored and treated. Your caregiver may ask questions about your medical history and use of alcohol or drugs, including prescription medication. Certain medical conditions and substances also can cause emotional highs and lows that resemble bipolar disorder (secondary bipolar disorder).  TREATMENT  Bipolar disorder is a long-term illness. It is best controlled with continuous treatment rather than treatment only when symptoms occur. The following treatments can be prescribed for bipolar disorders:  Medication--Medication can be prescribed by a doctor that is an expert in treating mental disorders (psychiatrists). Medications called mood stabilizers are usually prescribed to help control the illness. Other medications  are sometimes added if symptoms of mania, depression, or psychotic delusions and hallucinations occur despite the use of a mood stabilizer.  Talk therapy--Some forms of talk therapy are  helpful in providing support, education, and guidance. A combination of medication and talk therapy is best for managing the disorder over time. A procedure in which electricity is applied to your brain through your scalp (electroconvulsive therapy) is used in cases of severe mania when medication and talk therapy do not work or work too slowly.   This information is not intended to replace advice given to you by your health care provider. Make sure you discuss any questions you have with your health care provider.   Document Released: 05/03/2000 Document Revised: 02/15/2014 Document Reviewed: 02/21/2012 Elsevier Interactive Patient Education [employer]

## 2015-02-21 ENCOUNTER — Observation Stay (HOSPITAL_COMMUNITY)
Admission: EM | Admit: 2015-02-21 | Discharge: 2015-02-21 | Disposition: A | Discharge status: Home / Self Care | Payer: Managed Care, Other (non HMO) | Source: Intra-hospital | Attending: Psychiatry | Admitting: Psychiatry

## 2015-02-21 ENCOUNTER — Encounter (HOSPITAL_COMMUNITY): Payer: Self-pay | Admitting: Behavioral Health

## 2015-02-21 DIAGNOSIS — G609 Hereditary and idiopathic neuropathy, unspecified: Secondary | ICD-10-CM | POA: Diagnosis not present

## 2015-02-21 DIAGNOSIS — F411 Generalized anxiety disorder: Secondary | ICD-10-CM | POA: Diagnosis not present

## 2015-02-21 DIAGNOSIS — G43909 Migraine, unspecified, not intractable, without status migrainosus: Secondary | ICD-10-CM | POA: Diagnosis not present

## 2015-02-21 DIAGNOSIS — E785 Hyperlipidemia, unspecified: Secondary | ICD-10-CM | POA: Insufficient documentation

## 2015-02-21 DIAGNOSIS — F319 Bipolar disorder, unspecified: Secondary | ICD-10-CM | POA: Diagnosis not present

## 2015-02-21 DIAGNOSIS — K219 Gastro-esophageal reflux disease without esophagitis: Secondary | ICD-10-CM | POA: Insufficient documentation

## 2015-02-21 DIAGNOSIS — M810 Age-related osteoporosis without current pathological fracture: Secondary | ICD-10-CM | POA: Insufficient documentation

## 2015-02-21 DIAGNOSIS — F10229 Alcohol dependence with intoxication, unspecified: Secondary | ICD-10-CM | POA: Diagnosis not present

## 2015-02-21 DIAGNOSIS — F3163 Bipolar disorder, current episode mixed, severe, without psychotic features: Secondary | ICD-10-CM

## 2015-02-21 DIAGNOSIS — M179 Osteoarthritis of knee, unspecified: Secondary | ICD-10-CM | POA: Insufficient documentation

## 2015-02-21 DIAGNOSIS — G35 Multiple sclerosis: Secondary | ICD-10-CM | POA: Diagnosis not present

## 2015-02-21 DIAGNOSIS — J45909 Unspecified asthma, uncomplicated: Secondary | ICD-10-CM | POA: Diagnosis not present

## 2015-02-21 DIAGNOSIS — F17210 Nicotine dependence, cigarettes, uncomplicated: Secondary | ICD-10-CM | POA: Diagnosis not present

## 2015-02-21 DIAGNOSIS — Z888 Allergy status to other drugs, medicaments and biological substances status: Secondary | ICD-10-CM | POA: Diagnosis not present

## 2015-02-21 DIAGNOSIS — F909 Attention-deficit hyperactivity disorder, unspecified type: Secondary | ICD-10-CM | POA: Diagnosis not present

## 2015-02-21 DIAGNOSIS — F1020 Alcohol dependence, uncomplicated: Secondary | ICD-10-CM | POA: Diagnosis present

## 2015-02-21 MED ORDER — MOMETASONE FURO-FORMOTEROL FUM 100-5 MCG/ACT IN AERO
2.0000 | INHALATION_SPRAY | Freq: Two times a day (BID) | RESPIRATORY_TRACT | Status: DC
  Filled 2015-02-21: qty 8.8

## 2015-02-21 MED ORDER — PANTOPRAZOLE SODIUM 40 MG PO TBEC: 40.0000 mg | DELAYED_RELEASE_TABLET | Freq: Two times a day (BID) | ORAL | Status: DC

## 2015-02-21 MED ORDER — TRAZODONE HCL 100 MG PO TABS: 100.0000 mg | ORAL_TABLET | Freq: Every evening | ORAL | Status: DC | PRN

## 2015-02-21 MED ORDER — HYDROXYZINE HCL 25 MG PO TABS: 25.0000 mg | ORAL_TABLET | Freq: Three times a day (TID) | ORAL | Status: DC | PRN

## 2015-02-21 MED ORDER — LISDEXAMFETAMINE DIMESYLATE 50 MG PO CAPS
50.0000 mg | ORAL_CAPSULE | Freq: Every day | ORAL | Status: DC
  Administered 2015-02-21: 50 mg via ORAL
  Filled 2015-02-21: qty 1

## 2015-02-21 MED ORDER — GABAPENTIN 100 MG PO CAPS
100.0000 mg | ORAL_CAPSULE | Freq: Three times a day (TID) | ORAL | Status: DC
  Administered 2015-02-21: 100 mg via ORAL
  Filled 2015-02-21: qty 1

## 2015-02-21 MED ORDER — FLUOXETINE HCL 20 MG PO CAPS
20.0000 mg | ORAL_CAPSULE | Freq: Every day | ORAL | Status: DC
  Administered 2015-02-21: 20 mg via ORAL
  Filled 2015-02-21: qty 1

## 2015-02-21 MED ORDER — HYDROCHLOROTHIAZIDE 12.5 MG PO TABS: 12.5000 mg | ORAL_TABLET | Freq: Every day | ORAL | Status: DC

## 2015-02-21 MED ORDER — HYDROCHLOROTHIAZIDE 25 MG PO TABS
12.5000 mg | ORAL_TABLET | Freq: Every day | ORAL | Status: DC
  Administered 2015-02-21: 12.5 mg via ORAL
  Filled 2015-02-21: qty 1

## 2015-02-21 MED ORDER — AMANTADINE HCL 100 MG PO CAPS: 100.0000 mg | ORAL_CAPSULE | Freq: Two times a day (BID) | ORAL | Status: DC

## 2015-02-21 MED ORDER — PANTOPRAZOLE SODIUM 40 MG PO TBEC
40.0000 mg | DELAYED_RELEASE_TABLET | Freq: Every day | ORAL | Status: DC
  Administered 2015-02-21: 40 mg via ORAL
  Filled 2015-02-21: qty 1

## 2015-02-21 MED ORDER — BENZTROPINE MESYLATE 0.5 MG PO TABS: 0.5000 mg | ORAL_TABLET | Freq: Every day | ORAL | Status: DC

## 2015-02-21 MED ORDER — FLUOXETINE HCL 20 MG PO CAPS: 20.0000 mg | ORAL_CAPSULE | Freq: Every day | ORAL | Status: DC

## 2015-02-21 MED ORDER — MAGNESIUM HYDROXIDE 400 MG/5ML PO SUSP
30.0000 mL | Freq: Every day | ORAL | Status: DC | PRN
Start: 2015-02-21 — End: 2015-02-21

## 2015-02-21 MED ORDER — RISPERIDONE 0.5 MG PO TABS: 2.5000 mg | ORAL_TABLET | Freq: Every day | ORAL | Status: DC

## 2015-02-21 MED ORDER — NICOTINE 21 MG/24HR TD PT24
MEDICATED_PATCH | TRANSDERMAL | Status: AC
  Administered 2015-02-21: 09:00:00
  Filled 2015-02-21: qty 1

## 2015-02-21 MED ORDER — LISDEXAMFETAMINE DIMESYLATE 50 MG PO CAPS: 50.0000 mg | ORAL_CAPSULE | Freq: Every day | ORAL | Status: DC

## 2015-02-21 MED ORDER — AMANTADINE HCL 100 MG PO CAPS
100.0000 mg | ORAL_CAPSULE | Freq: Two times a day (BID) | ORAL | Status: DC
  Administered 2015-02-21: 100 mg via ORAL
  Filled 2015-02-21 (×4): qty 1

## 2015-02-21 MED ORDER — HYDROXYZINE HCL 25 MG PO TABS
25.0000 mg | ORAL_TABLET | Freq: Three times a day (TID) | ORAL | Status: DC | PRN
  Administered 2015-02-21: 25 mg via ORAL
  Filled 2015-02-21: qty 1

## 2015-02-21 MED ORDER — TOPIRAMATE 100 MG PO TABS: 100.0000 mg | ORAL_TABLET | Freq: Every day | ORAL | Status: DC

## 2015-02-21 MED ORDER — GABAPENTIN 100 MG PO CAPS: 100.0000 mg | ORAL_CAPSULE | Freq: Three times a day (TID) | ORAL | Status: DC | PRN

## 2015-02-21 MED ORDER — FLUTICASONE-SALMETEROL 100-50 MCG/DOSE IN AEPB: 1.0000 | INHALATION_SPRAY | Freq: Two times a day (BID) | RESPIRATORY_TRACT | Status: DC

## 2015-02-21 MED ORDER — NICOTINE 21 MG/24HR TD PT24
21.0000 mg | MEDICATED_PATCH | Freq: Every day | TRANSDERMAL | Status: DC
  Administered 2015-02-21: 21 mg via TRANSDERMAL

## 2015-02-21 MED ORDER — NICOTINE 21 MG/24HR TD PT24: 21.0000 mg | MEDICATED_PATCH | Freq: Every day | TRANSDERMAL | Status: DC

## 2015-02-21 MED ORDER — ALUM & MAG HYDROXIDE-SIMETH 200-200-20 MG/5ML PO SUSP: 30.0000 mL | ORAL | Status: DC | PRN

## 2015-02-21 MED ORDER — MODAFINIL 200 MG PO TABS: 200.0000 mg | ORAL_TABLET | Freq: Every day | ORAL | Status: DC

## 2015-02-21 MED ORDER — ACETAMINOPHEN 325 MG PO TABS
650.0000 mg | ORAL_TABLET | Freq: Four times a day (QID) | ORAL | Status: DC | PRN
Start: 2015-02-21 — End: 2015-02-21

## 2015-02-21 MED ORDER — ALBUTEROL SULFATE HFA 108 (90 BASE) MCG/ACT IN AERS
2.0000 | INHALATION_SPRAY | Freq: Four times a day (QID) | RESPIRATORY_TRACT | Status: DC | PRN
  Administered 2015-02-21: 2 via RESPIRATORY_TRACT
  Filled 2015-02-21: qty 6.7

## 2015-02-21 MED ORDER — SUMATRIPTAN SUCCINATE 50 MG PO TABS: 50.0000 mg | ORAL_TABLET | ORAL | Status: DC | PRN

## 2015-02-21 NOTE — Discharge Instructions (Signed)
Patient was discharged this date and will follow up with aftercare treatment.

## 2015-02-21 NOTE — Discharge Summary (Signed)
[facility] Discharge Summary Note  Patient:  Anne Black is an 55 y.o., female MRN:  [id number] DOB:  1960-05-09 Patient phone:  [phone] (home)  Patient address:   [place] [address],  Total Time spent with patient: 45 minutes  Date of Admission:  02/21/2015 Date of Discharge: 02/21/2015  Reason for Admission: Acute alcohol intoxication   Principal Problem: Alcohol use disorder, moderate, dependence ([place]) Discharge Diagnoses: Patient Active Problem List   Diagnosis Date Noted  . Stimulant abuse [F15.10] 12/27/2014  . Nicotine abuse [F19.10] 12/27/2014  . Hyperprolactinemia ([place]) [E22.1] 11/15/2014  . Bipolar disorder, curr episode mixed, severe, w/o psychotic features ([place]) [F31.63] 11/13/2014  . Dyslipidemia [E78.5]   . Alcohol use disorder, moderate, dependence ([place]) [F10.20] 10/30/2014  . Noncompliance with therapeutic plan [Z91.11] 04/04/2013  . Unspecified hereditary and idiopathic peripheral neuropathy [G60.9] 05/01/2012  . GERD (gastroesophageal reflux disease) [K21.9] 05/01/2012  . Osteoarthrosis, unspecified whether generalized or localized, involving lower leg [M17.9] 05/01/2012  . MS (multiple sclerosis) ([place]) [G35] 09/14/2011    Musculoskeletal: Strength & Muscle Tone: within normal limits Gait & Station: normal Patient leans: N/A  Psychiatric Specialty Exam: Physical Exam  Psychiatric: She has a normal mood and affect. Her behavior is normal. Judgment and thought content normal.    Review of Systems  Musculoskeletal: Positive for myalgias (chronic), back pain (chronic) and joint pain.  Neurological: Positive for tremors.  Psychiatric/Behavioral: Positive for substance abuse. The patient is nervous/anxious.   All other systems reviewed and are negative.   Blood pressure 124/65, pulse 101, temperature 98.7 F (37.1 C), temperature source Oral, resp. rate 22, height '5\' 2"'  (1.575 m), weight 94.257 kg (207 lb 12.8 oz), SpO2 93  %.Body mass index is 38 kg/(m^2).  General Appearance: Fairly Groomed  Engineer, water::  Good  Speech:  Clear and Coherent and Normal Rate  Volume:  Normal  Mood:  Anxious and Euthymic  Affect:  Congruent  Thought Process:  Circumstantial, Intact and Linear  Orientation:  Full (Time, Place, and Person)  Thought Content:  WDL  Suicidal Thoughts:  No  Homicidal Thoughts:  No  Memory:  Immediate;   Good Recent;   Fair Remote;   Fair  Judgement:  Fair  Insight:  Good  Psychomotor Activity:  Normal  Concentration:  Good  Recall:  Good  Fund of Knowledge:Fair  Language: Good  Akathisia:  No  Handed:  Right  AIMS (if indicated):     Assets:  Communication Skills Desire for Improvement Financial Resources/Insurance Housing Leisure Time Physical Health Social Support Vocational/Educational  ADL's:  Intact  Cognition: WNL  Sleep:      Have you used any form of tobacco in the last 30 days? (Cigarettes, Smokeless Tobacco, Cigars, and/or Pipes): Yes  Has this patient used any form of tobacco in the last 30 days? (Cigarettes, Smokeless Tobacco, Cigars, and/or Pipes) Yes, but was using nicotine patches at home already according to her prior to admission list.   Past Medical History:  Past Medical History  Diagnosis Date  . Multiple sclerosis ([place])   . Arthritis   . Anxiety   . Bipolar disorder ([place])   . ADHD (attention deficit hyperactivity disorder)   . Migraine   . Osteoporosis     Past Surgical History  Procedure Laterality Date  . Cesarean section  [id number]  . Myringotomy with tube placement Bilateral   . Tonsillectomy     Family History:  Family History  Problem Relation Age of  Onset  . Heart disease Mother    Social History:  History  Alcohol Use  . 4.8 oz/week  . 4 Standard drinks or equivalent, 2 Glasses of wine, 2 Cans of beer per week    Comment: pt states she drinks every 2-3 days     History  Drug Use No    Social History   Social History  .  Marital Status: Divorced    Spouse Name: N/A  . Number of Children: 5  . Years of Education: hs   Occupational History  .      on social security disability for MS   Social History Main Topics  . Smoking status: Current Every Day Smoker -- 0.75 packs/day for 20 years    Types: Cigarettes  . Smokeless tobacco: Never Used  . Alcohol Use: 4.8 oz/week    4 Standard drinks or equivalent, 2 Glasses of wine, 2 Cans of beer per week     Comment: pt states she drinks every 2-3 days  . Drug Use: No  . Sexual Activity: Not Currently   Other Topics Concern  . None   Social History Narrative    Past Psychiatric History: Hospitalizations: [facility] x 5x, since 2013  Outpatient Care: None  Substance Abuse Care: None  Self-Mutilation: None  Suicidal Attempts: None  Violent Behaviors: None   Risk to Self: Is patient at risk for suicide?: No Risk to Others:   Prior Inpatient Therapy:   Prior Outpatient Therapy:    Level of Care:  IOP  Hospital Course:  Anne Black was admitted for Alcohol use disorder, moderate, dependence ([place]) and crisis management.  She was treated discharged with the medications listed below under Medication List.  Medical problems were identified and treated as needed.  Home medications were restarted as appropriate. Patient had not been taking her medications for several months prior to her Observation Admission. She was restarted on Prozac, Vyvanse and Neurontin. The patient appeared to be especially focused on receiving Vyvanse but informed this medication was not advisable given her history of substance abuse and diagnosis of Bipolar Disorder. She was noted to be tremulous during assessment but declined detox stating "I just drank before I went to court because I was so nervous. I know that was not smart. I also have multiple sclerosis and need to get on a treatment plan. I have thought about going to [facility] for my alcohol use. I need to get back on my  medications again. I have anxiety and depression."   Improvement was monitored by observation and Tawanna Sat daily report of symptom reduction.  Emotional and mental status was monitored by daily self-inventory reports completed by Tawanna Sat and clinical staff.  Patient requested to be discharged from the Observation Unit. She was agreeable to follow up at [facility] for substance abuse treatment and for medication management. Her prior to admission medication list showed inconsistencies in her regimen with many medications reported as no longer being taken. Review of her last discharge summary indicated that in October of 2016 the patient was discharged on a regimen of Risperdal, Cogentin, and Topamax. These medications were treatments for Bipolar Disorder that the patient has been diagnosed with. However, patient disputed this diagnosis stating "My mother has been trying to get me labeled with the for a while."       Anne Black was evaluated by the treatment team for stability and plans for continued recovery upon discharge.  Tawanna Sat  motivation was an integral factor for scheduling further treatment.  Employment, transportation, bed availability, health status, family support, and any pending legal issues were also considered during her hospital stay. She was offered further treatment options upon discharge including but not limited to Residential, Intensive Outpatient, and Outpatient treatment.  Anne Black will follow up with the services as listed below under Follow Up Information. Patient requested a prescription for vistaril upon discharge stating "I already have Prozac. Not really sure Neurontin helps anyway." Patient was informed that she would not be provided with a prescription for Vyvanse but could discuss this with her outpatient provider after leaving the hospital.   Upon completion of this admission the patient was both mentally and medically stable for discharge denying  suicidal/homicidal ideation, auditory/visual/tactile hallucinations, delusional thoughts and paranoia.      Consults:  None  Significant Diagnostic Studies:  None  Discharge Vitals:   Blood pressure 124/65, pulse 101, temperature 98.7 F (37.1 C), temperature source Oral, resp. rate 22, height '5\' 2"'  (1.575 m), weight 94.257 kg (207 lb 12.8 oz), SpO2 93 %. Body mass index is 38 kg/(m^2). Lab Results:   Results for orders placed or performed during the hospital encounter of 02/20/15 (from the past 72 hour(s))  Comprehensive metabolic panel     Status: Abnormal   Collection Time: 02/20/15  4:36 PM  Result Value Ref Range   Sodium 140 135 - 145 mmol/L   Potassium 3.8 3.5 - 5.1 mmol/L   Chloride 97 (L) 101 - 111 mmol/L   CO2 23 22 - 32 mmol/L   Glucose, Bld 94 65 - 99 mg/dL   BUN <5 (L) 6 - 20 mg/dL   Creatinine, Ser 0.68 0.44 - 1.00 mg/dL   Calcium 8.9 8.9 - 10.3 mg/dL   Total Protein 7.5 6.5 - 8.1 g/dL   Albumin 4.1 3.5 - 5.0 g/dL   AST 162 (H) 15 - 41 U/L   ALT 113 (H) 14 - 54 U/L   Alkaline Phosphatase 117 38 - 126 U/L   Total Bilirubin 1.7 (H) 0.3 - 1.2 mg/dL   GFR calc non Af Amer >60 >60 mL/min   GFR calc Af Amer >60 >60 mL/min    Comment: (NOTE) The eGFR has been calculated using the CKD EPI equation. This calculation has not been validated in all clinical situations. eGFR's persistently <60 mL/min signify possible Chronic Kidney Disease.    Anion gap 20 (H) 5 - 15  CBC with Differential     Status: Abnormal   Collection Time: 02/20/15  4:36 PM  Result Value Ref Range   WBC 6.0 4.0 - 10.5 K/uL   RBC 4.36 3.87 - 5.11 MIL/uL   Hemoglobin 14.7 12.0 - 15.0 g/dL   HCT 45.4 36.0 - 46.0 %   MCV 104.1 (H) 78.0 - 100.0 fL   MCH 33.7 26.0 - 34.0 pg   MCHC 32.4 30.0 - 36.0 g/dL   RDW 16.0 (H) 11.5 - 15.5 %   Platelets 225 150 - 400 K/uL   Neutrophils Relative % 61 %   Neutro Abs 3.6 1.7 - 7.7 K/uL   Lymphocytes Relative 32 %   Lymphs Abs 1.9 0.7 - 4.0 K/uL   Monocytes  Relative 6 %   Monocytes Absolute 0.4 0.1 - 1.0 K/uL   Eosinophils Relative 0 %   Eosinophils Absolute 0.0 0.0 - 0.7 K/uL   Basophils Relative 1 %   Basophils Absolute 0.1 0.0 - 0.1 K/uL  Ethanol  Status: Abnormal   Collection Time: 02/20/15  4:37 PM  Result Value Ref Range   Alcohol, Ethyl (B) 313 (HH) <5 mg/dL    Comment:        LOWEST DETECTABLE LIMIT FOR SERUM ALCOHOL IS 5 mg/dL FOR MEDICAL PURPOSES ONLY CRITICAL RESULT CALLED TO, READ BACK BY AND VERIFIED WITH: A.NEASE AT 1727 ON 02/20/15 BY S.VANHOORNE   Urine rapid drug screen (hosp performed)     Status: Abnormal   Collection Time: 02/20/15  6:44 PM  Result Value Ref Range   Opiates POSITIVE (A) NONE DETECTED   Cocaine NONE DETECTED NONE DETECTED   Benzodiazepines NONE DETECTED NONE DETECTED   Amphetamines NONE DETECTED NONE DETECTED   Tetrahydrocannabinol NONE DETECTED NONE DETECTED   Barbiturates NONE DETECTED NONE DETECTED    Comment:        DRUG SCREEN FOR MEDICAL PURPOSES ONLY.  IF CONFIRMATION IS NEEDED FOR ANY PURPOSE, NOTIFY LAB WITHIN 5 DAYS.        LOWEST DETECTABLE LIMITS FOR URINE DRUG SCREEN Drug Class       Cutoff (ng/mL) Amphetamine      1000 Barbiturate      200 Benzodiazepine   182 Tricyclics       993 Opiates          300 Cocaine          300 THC              50     Physical Findings: AIMS: Facial and Oral Movements Muscles of Facial Expression: None, normal Lips and Perioral Area: None, normal Jaw: None, normal Tongue: None, normal,Extremity Movements Upper (arms, wrists, hands, fingers): None, normal Lower (legs, knees, ankles, toes): None, normal, Trunk Movements Neck, shoulders, hips: None, normal, Overall Severity Severity of abnormal movements (highest score from questions above): None, normal Incapacitation due to abnormal movements: None, normal, Dental Status Current problems with teeth and/or dentures?: Yes Does patient usually wear dentures?: No  CIWA:  CIWA-Ar Total:  10 COWS:     Discharge destination:  Home  Is patient on multiple antipsychotic therapies at discharge:  No   Has Patient had three or more failed trials of antipsychotic monotherapy by history:  No  Recommended Plan for Multiple Antipsychotic Therapies: NA      Discharge Instructions    Discharge instructions    Complete by:  As directed   Please follow up with your Medical Provider as scheduled for any further refills. Also with your Neurologist as discussed in the Observation Unit for treatment options for reported diagnoses of Multiple Sclerosis.            Medication List    STOP taking these medications        ARIPiprazole 10 MG tablet  Commonly known as:  ABILIFY     clonazePAM 0.5 MG tablet  Commonly known as:  KLONOPIN     guaiFENesin 600 MG 12 hr tablet  Commonly known as:  MUCINEX     lamoTRIgine 25 MG tablet  Commonly known as:  LAMICTAL     meloxicam 7.5 MG tablet  Commonly known as:  MOBIC      TAKE these medications      Indication   albuterol 108 (90 Base) MCG/ACT inhaler  Commonly known as:  PROVENTIL HFA;VENTOLIN HFA  Inhale 2 puffs into the lungs every 6 (six) hours as needed for wheezing.      amantadine 100 MG capsule  Commonly known as:  SYMMETREL  Take  1 capsule (100 mg total) by mouth 2 (two) times daily. Reported on 02/20/2015   Indication:  Tiredness associated with Multiple Sclerosis     benztropine 0.5 MG tablet  Commonly known as:  COGENTIN  Take 1 tablet (0.5 mg total) by mouth at bedtime.   Indication:  EPS prophylactic     FLUoxetine 20 MG capsule  Commonly known as:  PROZAC  Take 1 capsule (20 mg total) by mouth daily. Reported on 02/20/2015   Indication:  Manic-Depression, Depression     Fluticasone-Salmeterol 100-50 MCG/DOSE Aepb  Commonly known as:  ADVAIR  Inhale 1 puff into the lungs 2 (two) times daily.   Indication:  Asthma     gabapentin 100 MG capsule  Commonly known as:  NEURONTIN  Take 1 capsule (100 mg  total) by mouth 3 (three) times daily as needed. Reported on 02/20/2015   Indication:  Agitation     hydrochlorothiazide 12.5 MG tablet  Commonly known as:  HYDRODIURIL  Take 1 tablet (12.5 mg total) by mouth daily. Reported on 02/20/2015   Indication:  High Blood Pressure     hydrOXYzine 25 MG tablet  Commonly known as:  ATARAX/VISTARIL  Take 1 tablet (25 mg total) by mouth 3 (three) times daily as needed (sleep).   Indication:  Anxiety Neurosis, Sedation, Tension     lidocaine 5 %  Commonly known as:  LIDODERM  Place 1 patch onto the skin daily as needed (for area underarm for redness/swelling/pain.). Remove & Discard patch within 12 hours or as directed by MD      lisdexamfetamine 50 MG capsule  Commonly known as:  VYVANSE  Take 1 capsule (50 mg total) by mouth daily. Reported on 02/20/2015   Indication:  Improved focus per patient     modafinil 200 MG tablet  Commonly known as:  PROVIGIL  Take 1 tablet (200 mg total) by mouth daily. Reported on 02/20/2015   Indication:  Tiredness associated with Multiple Sclerosis     nicotine 21 mg/24hr patch  Commonly known as:  NICODERM CQ - dosed in mg/24 hours  Place 1 patch (21 mg total) onto the skin daily.   Indication:  Nicotine Addiction     pantoprazole 40 MG tablet  Commonly known as:  PROTONIX  Take 1 tablet (40 mg total) by mouth 2 (two) times daily.   Indication:  Gastroesophageal Reflux Disease     risperiDONE 0.5 MG tablet  Commonly known as:  RISPERDAL  Take 5 tablets (2.5 mg total) by mouth at bedtime.   Indication:  mood stabilization     rizatriptan 10 MG tablet  Commonly known as:  MAXALT  Take 10 mg by mouth daily as needed for migraine. Reported on 02/20/2015      topiramate 100 MG tablet  Commonly known as:  TOPAMAX  Take 1 tablet (100 mg total) by mouth daily.   Indication:  Migraine Headache     traZODone 100 MG tablet  Commonly known as:  DESYREL  Take 1 tablet (100 mg total) by mouth at bedtime as  needed for sleep.   Indication:  Trouble Sleeping       Follow-up Information    Follow up with [facility]. Call in 1 day.   Why:  follow up care   Contact information:   [place]      Follow-up recommendations:  Activity:  Increase activity as tolerated Diet:  Regular diet.  Tests:  Will need a prolactin level recheck  in 3 months.   Comments:  Take all medications as prescribed. Keep all follow-up appointments as scheduled.  Do not consume alcohol or use illegal drugs while on prescription medications. Report any adverse effects from your medications to your primary care provider promptly.  In the event of recurrent symptoms or worsening symptoms, call 911, a crisis hotline, or go to the nearest emergency department for evaluation.  Total Discharge Time: Greater than 30 minutes Signed: Kathlyne Loud NP-C 02/21/2015, 2:10 PM

## 2015-02-21 NOTE — Progress Notes (Signed)
Patient ID: Anne Black, female   DOB: 1960-10-17, 55 y.o.   MRN: [id number] Patient discharged to lobby to take cab home. Patient denied SI, HI, AVH. Patient given belongings.

## 2015-02-21 NOTE — BH Assessment (Signed)
[facility] Assessment Progress Note  This Clinical research associate spoke with patient this date with Earlene Plater NP to determine disposition. Patient denied any S/I or H/I and asked to be discharged with resources to address alcohol and mental health issues. Patient was given information on outpatient services  In reference to  specific needs and contacted [facility] to assist with follow up care. Patient stated they might be interested in residential services but first wanted to address their legal issues associated with a DUI. Patient was given information on [facility] along with a list of treatment providers associated with residential care. Patient was discharged with resources and linked to her residence by taxi in [place].

## 2015-02-21 NOTE — [facility] Group Notes (Addendum)
Observation Unit Nursing Admission Note: Patient admitted to the unit after she went the the hospital intoxicated.  Patient states she had a court date for a DWI and because she wasn't driving she drank one scotch.  Patient states she does not remember what happened at the court house but per report patient started yelling and acting bizarre at the court house.  On admission at [facility] patient etoh level was 313.  Patient states she wanted to go home but wanted to stay and rest for a few hours.  Patient denies SI/HI and denies AVH.  Patient states she does not want or need ETOH detox.  Patient states she has prescription medications but states she has been out of then for the past few months.  Patient states she lives alone and her ex-husband helps her out at times.  Patient agreed to stay in the unit tonight but patient states, "I want to leave first thing tomorrow and I need a cab voucher."  Patient cooperative and oriented to the unit.  Patient belongings secured in locker #31.

## 2015-02-21 NOTE — [facility] Counselor (Signed)
This Clinical research associate spoke with patient about specific needs, per note by counselor, pt does not want detox.  She is requesting inpt rehab and told this Clinical research associate that she has been researching rehab facilities and has had response from [facility]. Pt says she was denied services there due to her insurance.  This writer unable to continue interview because pt had been given ativan, she in/out consciousness.

## 2015-02-21 NOTE — Progress Notes (Signed)
Patient is resting but not consistently patient has sat up several times to ask the time and states, "Ok I am ready to go home now."

## 2015-02-21 NOTE — Progress Notes (Signed)
Patient ID: Anne Black, female   DOB: 1961/02/04, 55 y.o.   MRN: [id number] Patient stated that she wanted to leave AMA. Patient states she does not want or need detox. Discussed with provider and counselor. Patient to be discharged.

## 2015-02-21 NOTE — Plan of Care (Signed)
[facility] Observation Crisis Plan  Reason for Crisis Plan:  Medication Management and Substance Abuse   Plan of Care:  Referral for Substance Abuse  Family Support:      Current Living Environment:  Living Arrangements: Alone  Insurance:   Hospital Account    Name Acct ID Class Status Primary Coverage   Anne Black, Anne Black [id number] BEHAVIORAL HEALTH OBSERVATION Open AETNA - AETNA INDEMNITY        Guarantor Account (for Hospital Account [id number])    Name Relation to Pt Service Area Active? Acct Type   Anne Black Self [facility] Yes Behavioral Health   Address Phone       [address] [place], [place] [address] [phone](H)          Coverage Information (for Hospital Account [id number])    1. AETNA/AETNA INDEMNITY    F/O Payor/Plan Precert #   AETNA/AETNA INDEMNITY    Subscriber Subscriber #   Anne, Black [id number]   Address Phone   [address] [address] [place], [place] [address] [phone]       2. AETNA/AETNA MANAGED    F/O Payor/Plan Precert #   AETNA/AETNA MANAGED    Subscriber Subscriber #   Anne, Black [id number]   Address Phone   [address] [address] [place], [place] [address] [phone]          Legal Guardian:     Primary Care Provider:  No primary care provider on file.  Current Outpatient Providers:  [facility]  Psychiatrist:     Counselor/Therapist:     Compliant with Medications:  No  Additional Information:   Anne Black 1/13/20172:08 AM

## 2015-02-21 NOTE — H&P (Signed)
Observation Unit  Assessment Adult  [name] MRN:  [id number] Date of Evaluation:  02/21/2015 Chief Complaint:  MDD Principal Diagnosis: Alcohol use disorder, moderate, dependence ([place]) Diagnosis:   Patient Active Problem List   Diagnosis Date Noted  . Stimulant abuse [F15.10] 12/27/2014  . Nicotine abuse [F19.10] 12/27/2014  . Hyperprolactinemia ([place]) [E22.1] 11/15/2014  . Bipolar disorder, curr episode mixed, severe, w/o psychotic features ([place]) [F31.63] 11/13/2014  . Dyslipidemia [E78.5]   . Alcohol use disorder, moderate, dependence ([place]) [F10.20] 10/30/2014  . Noncompliance with therapeutic plan [Z91.11] 04/04/2013  . Unspecified hereditary and idiopathic peripheral neuropathy [G60.9] 05/01/2012  . GERD (gastroesophageal reflux disease) [K21.9] 05/01/2012  . Osteoarthrosis, unspecified whether generalized or localized, involving lower leg [M17.9] 05/01/2012  . MS (multiple sclerosis) ([place]) [G35] 09/14/2011  Subjective:  Patient is a 55 y.o. female with history of Bipolar, MS and Alcohol abuse presents to the Obs Unit for observation and stabilization post alcohol intoxication and mental status changes.     History of Present Illness:Anne Black is an 55 y.o. female presenting to [facility] intoxicated. It has been reported that pt was found outside of the courthouse screaming and yelling earlier today. Pt stated "I am under a lot of stress". "I had court today so I called my ex-husband for a ride today". Pt denies SI, HI and AVH at this time. Pt did not report any previous suicide attempts or self-injurious behaviors. Pt reported that she is dealing with multiple stressors such as her head and DWI charges. Pt reported multiple depressive symptoms and shared that her sleep has been inconsistent. Pt did not report any issues with her appetite but reported weight gain over the past 3 years. Pt did not report any current mental health treatment but reported that she  contacted a therapist and will schedule an appointment. Pt's reported that she drunk 2 scotches today and stated "I had 2 scotches because I was going to court. Pt was offered detox but she declined and stated "I don't think I need to stay for detox".  Pt does not meet inpatient criteria at this time.  Obs Unit today:  [name] interviewed and chart reviewed.  Patient reports that she has been under stress lately including appearing in court for a  DUI yesterday.  She remembers drinking some "glasses" of scotch the night before and yesterday morning before going to court.  She does not remember what happened after. She reports  her ex-husband who was giving  her a ride to the court house told her she was intoxicated and requested to be taken to ER.  Her BAL on admission.  Chart record indicates she was confused and has altered mental status changes.    Has a history of alcohol abuse, depression and bipolar disorder. She initially denies alcohol use issue but later states she and her mom are looking over some  alcohol residential treatment facilities.   Patient is reporting that she  ran out of her medications since November 2016 and all she wants now is to have medication refills. Patient received inpatient treatment at  [facility] in 10/2014 and 11/2014 and discharged with 30 days prescriptions.  Patient had a follow up appointment with [facility] outpatient and was no show and hence was on a cancel wait list  Patient was contacted but no indication that she showed up for her appointment.  She reports being very busy with taking care of issues in her home and repairing her cars. She  reports that she does not want to receive care here at [facility] and had asked her mother to take her to [facility].     Patient is alert, oriented x4, to person, place, time and situation.  She denies SI/HI/AVH. There is no indication that she is responding to internal stimuli.  Patient is very guarded and minimizes her drinking problem and  other mental health  Issues. She is more worried about the warrant out for her arrest for failure to appear in court yesterday in addition to the DUI charge.  She states her family are making up about her mental illness and states that she can do without some of the medications.  She reports she is anxious and that is why she drinks to calm her nerves down. Patient is insisting on getting refills and going home.  She is actually trying to call someone now to pick her up.    Associated Signs/Symptoms: Depression Symptoms:  depressed mood, (Hypo) Manic Symptoms:  Grandiosity, Anxiety Symptoms:  None reported. She reports she drinks becasuse of anxiety Psychotic Symptoms:  None reported PTSD Symptoms: None reported Total Time spent with patient: 45 minutes  Past Psychiatric History: See HPI  Risk to Self: Is patient at risk for suicide?: No Risk to Others:   Prior Inpatient Therapy:   Prior Outpatient Therapy:    Alcohol Screening: 1. How often do you have a drink containing alcohol?: 2 to 3 times a week 2. How many drinks containing alcohol do you have on a typical day when you are drinking?: 1 or 2 (patient etoh level 313 on admission) 3. How often do you have six or more drinks on one occasion?: Never Preliminary Score: 0 9. Have you or someone else been injured as a result of your drinking?: Yes, during the last year 10. Has a relative or friend or a doctor or another health worker been concerned about your drinking or suggested you cut down?: Yes, during the last year Alcohol Use Disorder Identification Test Final Score (AUDIT): 11 Brief Intervention: Patient declined brief intervention Substance Abuse History in the last 12 months:  Yes.   Consequences of Substance Abuse: Legal Consequences:  DUI, and failure to appear Family Consequences:  Conflict with daughter Previous Psychotropic Medications: Yes  Psychological Evaluations: Not reported Past Medical History:  Past Medical  History  Diagnosis Date  . Multiple sclerosis ([place])   . Arthritis   . Anxiety   . Bipolar disorder ([place])   . ADHD (attention deficit hyperactivity disorder)   . Migraine   . Osteoporosis     Past Surgical History  Procedure Laterality Date  . Cesarean section  [id number]  . Myringotomy with tube placement Bilateral   . Tonsillectomy     Family History:  Family History  Problem Relation Age of Onset  . Heart disease Mother    Family Psychiatric  History: None reported Social History:  History  Alcohol Use  . 4.8 oz/week  . 4 Standard drinks or equivalent, 2 Glasses of wine, 2 Cans of beer per week    Comment: pt states she drinks every 2-3 days     History  Drug Use No    Social History   Social History  . Marital Status: Divorced    Spouse Name: N/A  . Number of Children: 5  . Years of Education: hs   Occupational History  .      on social security disability for MS   Social History Main Topics  .  Smoking status: Current Every Day Smoker -- 0.75 packs/day for 20 years    Types: Cigarettes  . Smokeless tobacco: Never Used  . Alcohol Use: 4.8 oz/week    4 Standard drinks or equivalent, 2 Glasses of wine, 2 Cans of beer per week     Comment: pt states she drinks every 2-3 days  . Drug Use: No  . Sexual Activity: Not Currently   Other Topics Concern  . None   Social History Narrative   Additional Social History:    Pain Medications: hydrocodone Prescriptions: hydrocodone History of alcohol / drug use?: Yes Longest period of sobriety (when/how long): unknown Negative Consequences of Use: Legal, Personal relationships Withdrawal Symptoms: Irritability, Nausea / Vomiting, Sweats, Tremors Name of Substance 1: Alcohol  1 - Age of First Use: 20's  1 - Amount (size/oz): "2 scotches"  1 - Frequency: daily  1 - Duration: ongoing  1 - Last Use / Amount: 02-20-15 BAL:313                  Allergies:   Allergies  Allergen Reactions  . Meloxicam Other  (See Comments)    Bad stomach   Lab Results:  Results for orders placed or performed during the hospital encounter of 02/20/15 (from the past 48 hour(s))  Comprehensive metabolic panel     Status: Abnormal   Collection Time: 02/20/15  4:36 PM  Result Value Ref Range   Sodium 140 135 - 145 mmol/L   Potassium 3.8 3.5 - 5.1 mmol/L   Chloride 97 (L) 101 - 111 mmol/L   CO2 23 22 - 32 mmol/L   Glucose, Bld 94 65 - 99 mg/dL   BUN <5 (L) 6 - 20 mg/dL   Creatinine, Ser 0.68 0.44 - 1.00 mg/dL   Calcium 8.9 8.9 - 10.3 mg/dL   Total Protein 7.5 6.5 - 8.1 g/dL   Albumin 4.1 3.5 - 5.0 g/dL   AST 162 (H) 15 - 41 U/L   ALT 113 (H) 14 - 54 U/L   Alkaline Phosphatase 117 38 - 126 U/L   Total Bilirubin 1.7 (H) 0.3 - 1.2 mg/dL   GFR calc non Af Amer >60 >60 mL/min   GFR calc Af Amer >60 >60 mL/min    Comment: (NOTE) The eGFR has been calculated using the CKD EPI equation. This calculation has not been validated in all clinical situations. eGFR's persistently <60 mL/min signify possible Chronic Kidney Disease.    Anion gap 20 (H) 5 - 15  CBC with Differential     Status: Abnormal   Collection Time: 02/20/15  4:36 PM  Result Value Ref Range   WBC 6.0 4.0 - 10.5 K/uL   RBC 4.36 3.87 - 5.11 MIL/uL   Hemoglobin 14.7 12.0 - 15.0 g/dL   HCT 45.4 36.0 - 46.0 %   MCV 104.1 (H) 78.0 - 100.0 fL   MCH 33.7 26.0 - 34.0 pg   MCHC 32.4 30.0 - 36.0 g/dL   RDW 16.0 (H) 11.5 - 15.5 %   Platelets 225 150 - 400 K/uL   Neutrophils Relative % 61 %   Neutro Abs 3.6 1.7 - 7.7 K/uL   Lymphocytes Relative 32 %   Lymphs Abs 1.9 0.7 - 4.0 K/uL   Monocytes Relative 6 %   Monocytes Absolute 0.4 0.1 - 1.0 K/uL   Eosinophils Relative 0 %   Eosinophils Absolute 0.0 0.0 - 0.7 K/uL   Basophils Relative 1 %   Basophils Absolute 0.1 0.0 -  0.1 K/uL  Ethanol     Status: Abnormal   Collection Time: 02/20/15  4:37 PM  Result Value Ref Range   Alcohol, Ethyl (B) 313 (HH) <5 mg/dL    Comment:        LOWEST DETECTABLE  LIMIT FOR SERUM ALCOHOL IS 5 mg/dL FOR MEDICAL PURPOSES ONLY CRITICAL RESULT CALLED TO, READ BACK BY AND VERIFIED WITH: A.NEASE AT 1727 ON 02/20/15 BY S.VANHOORNE   Urine rapid drug screen (hosp performed)     Status: Abnormal   Collection Time: 02/20/15  6:44 PM  Result Value Ref Range   Opiates POSITIVE (A) NONE DETECTED   Cocaine NONE DETECTED NONE DETECTED   Benzodiazepines NONE DETECTED NONE DETECTED   Amphetamines NONE DETECTED NONE DETECTED   Tetrahydrocannabinol NONE DETECTED NONE DETECTED   Barbiturates NONE DETECTED NONE DETECTED    Comment:        DRUG SCREEN FOR MEDICAL PURPOSES ONLY.  IF CONFIRMATION IS NEEDED FOR ANY PURPOSE, NOTIFY LAB WITHIN 5 DAYS.        LOWEST DETECTABLE LIMITS FOR URINE DRUG SCREEN Drug Class       Cutoff (ng/mL) Amphetamine      1000 Barbiturate      200 Benzodiazepine   979 Tricyclics       892 Opiates          300 Cocaine          300 THC              50     Metabolic Disorder Labs:  Lab Results  Component Value Date   HGBA1C 5.5 11/02/2014   MPG 111 11/02/2014   Lab Results  Component Value Date   PROLACTIN 26.3* 11/13/2014   Lab Results  Component Value Date   CHOL 243* 11/02/2014   TRIG 154* 11/02/2014   HDL 39* 11/02/2014   CHOLHDL 6.2 11/02/2014   VLDL 31 11/02/2014   LDLCALC 173* 11/02/2014    Current Medications: Current Facility-Administered Medications  Medication Dose Route Frequency Provider Last Rate Last Dose  . acetaminophen (TYLENOL) tablet 650 mg  650 mg Oral Q6H PRN Harriet Butte, NP      . albuterol (PROVENTIL HFA;VENTOLIN HFA) 108 (90 Base) MCG/ACT inhaler 2 puff  2 puff Inhalation Q6H PRN Harriet Butte, NP   2 puff at 02/21/15 0601  . alum & mag hydroxide-simeth (MAALOX/MYLANTA) 200-200-20 MG/5ML suspension 30 mL  30 mL Oral Q4H PRN Harriet Butte, NP      . amantadine (SYMMETREL) capsule 100 mg  100 mg Oral BID Harriet Butte, NP      . FLUoxetine (PROZAC) capsule 20 mg  20 mg Oral Daily  Harriet Butte, NP      . gabapentin (NEURONTIN) capsule 100 mg  100 mg Oral TID Harriet Butte, NP      . hydrochlorothiazide (HYDRODIURIL) tablet 12.5 mg  12.5 mg Oral Daily Harriet Butte, NP      . hydrOXYzine (ATARAX/VISTARIL) tablet 25 mg  25 mg Oral TID PRN Harriet Butte, NP      . lisdexamfetamine (VYVANSE) capsule 50 mg  50 mg Oral Daily Harriet Butte, NP      . magnesium hydroxide (MILK OF MAGNESIA) suspension 30 mL  30 mL Oral Daily PRN Harriet Butte, NP      . mometasone-formoterol (DULERA) 100-5 MCG/ACT inhaler 2 puff  2 puff Inhalation BID Harriet Butte, NP   2 puff at 02/21/15  0215  . pantoprazole (PROTONIX) EC tablet 40 mg  40 mg Oral QAC breakfast Harriet Butte, NP      . SUMAtriptan (IMITREX) tablet 50 mg  50 mg Oral Q2H PRN Harriet Butte, NP       PTA Medications: Prescriptions prior to admission  Medication Sig Dispense Refill Last Dose  . albuterol (PROVENTIL HFA;VENTOLIN HFA) 108 (90 BASE) MCG/ACT inhaler Inhale 2 puffs into the lungs every 6 (six) hours as needed for wheezing. 1 Inhaler 0 Unknown at Unknown time  . amantadine (SYMMETREL) 100 MG capsule Take 100 mg by mouth 2 (two) times daily. Reported on 02/20/2015   More than a month at Unknown time  . ARIPiprazole (ABILIFY) 10 MG tablet Take 1 tablet (10 mg total) by mouth every evening. (Patient not taking: Reported on 12/27/2014) 30 tablet 0 More than a month at Unknown time  . benztropine (COGENTIN) 0.5 MG tablet Take 1 tablet (0.5 mg total) by mouth at bedtime. (Patient not taking: Reported on 11/12/2014) 30 tablet 0 More than a month at Unknown time  . clonazePAM (KLONOPIN) 0.5 MG tablet Take 1 tablet (0.5 mg total) by mouth 2 (two) times daily as needed (Anxiety, Agitation). (Patient not taking: Reported on 02/20/2015) 28 tablet 0 More than a month at Unknown time  . FLUoxetine (PROZAC) 20 MG capsule Take 20 mg by mouth daily. Reported on 02/20/2015   More than a month at Unknown time  .  Fluticasone-Salmeterol (ADVAIR) 100-50 MCG/DOSE AEPB Inhale 1 puff into the lungs 2 (two) times daily. 60 each 0 Unknown at Unknown time  . gabapentin (NEURONTIN) 100 MG capsule Take 100 mg by mouth 3 (three) times daily as needed. Reported on 02/20/2015   Not Taking at Unknown time  . guaiFENesin (MUCINEX) 600 MG 12 hr tablet Take 1 tablet (600 mg total) by mouth 2 (two) times daily. (Patient not taking: Reported on 12/27/2014) 6 tablet 0 More than a month at Unknown time  . hydrochlorothiazide (HYDRODIURIL) 12.5 MG tablet Take 12.5 mg by mouth daily. Reported on 02/20/2015  0 More than a month at Unknown time  . lamoTRIgine (LAMICTAL) 25 MG tablet Take 1 tablet (25 mg total) by mouth daily. (Patient not taking: Reported on 12/27/2014) 30 tablet 0 More than a month at Unknown time  . lidocaine (LIDODERM) 5 % Place 1 patch onto the skin daily as needed (for area underarm for redness/swelling/pain.). Remove & Discard patch within 12 hours or as directed by MD   More than a month at Unknown time  . lisdexamfetamine (VYVANSE) 50 MG capsule Take 50 mg by mouth daily. Reported on 02/20/2015   More than a month at Unknown time  . meloxicam (MOBIC) 7.5 MG tablet Take 1 tablet (7.5 mg total) by mouth daily. (Patient not taking: Reported on 02/20/2015) 30 tablet 0 More than a month at Unknown time  . modafinil (PROVIGIL) 200 MG tablet Take 200 mg by mouth daily. Reported on 02/20/2015   More than a month at Unknown time  . nicotine (NICODERM CQ - DOSED IN MG/24 HOURS) 21 mg/24hr patch Place 1 patch (21 mg total) onto the skin daily. (Patient not taking: Reported on 12/27/2014) 28 patch 0 More than a month at Unknown time  . pantoprazole (PROTONIX) 40 MG tablet Take 1 tablet (40 mg total) by mouth daily before breakfast. (Patient taking differently: Take 40 mg by mouth 2 (two) times daily. ) 30 tablet 1 More than a month at Unknown time  .  risperiDONE (RISPERDAL) 0.5 MG tablet Take 5 tablets (2.5 mg total) by mouth at  bedtime. (Patient not taking: Reported on 11/12/2014) 150 tablet 0 More than a month at Unknown time  . risperiDONE (RISPERDAL) 1 MG tablet Take 1 tablet (1 mg total) by mouth daily. (Patient not taking: Reported on 11/12/2014) 30 tablet 0 More than a month at Unknown time  . rizatriptan (MAXALT) 10 MG tablet Take 10 mg by mouth daily as needed for migraine. Reported on 02/20/2015  0 More than a month at Unknown time  . topiramate (TOPAMAX) 100 MG tablet Take 1 tablet (100 mg total) by mouth daily. (Patient not taking: Reported on 12/27/2014) 30 tablet 0 More than a month at Unknown time  . traZODone (DESYREL) 100 MG tablet Take 1 tablet (100 mg total) by mouth at bedtime as needed for sleep. (Patient not taking: Reported on 12/27/2014) 30 tablet 0 More than a month at Unknown time    Musculoskeletal: Strength & Muscle Tone: within normal limits Gait & Station: normal Patient leans: Right  Psychiatric Specialty Exam: Physical Exam  ROS  Blood pressure 124/65, pulse 101, temperature 98.7 F (37.1 C), temperature source Oral, resp. rate 22, height '5\' 2"'  (1.575 m), weight 94.257 kg (207 lb 12.8 oz), SpO2 93 %.Body mass index is 38 kg/(m^2).  General Appearance: Fairly Groomed  Engineer, water::  Good  Speech:  Clear and Coherent and Normal Rate  Volume:  Normal  Mood:  Euthymic  Affect:  Appropriate and Congruent  Thought Process:  Coherent, Goal Directed and Linear  Orientation:  Full (Time, Place, and Person)  Thought Content:  WDL  Suicidal Thoughts:  No  Homicidal Thoughts:  No  Memory:  Immediate;   Fair Recent;   Fair Remote;   Fair  Judgement:  Fair  Insight:  Lacking  Psychomotor Activity:  Normal  Concentration:  Fair  Recall:  [employer] of Knowledge:Fair  Language: Good  Akathisia:  Negative  Handed:  Right  AIMS (if indicated):     Assets:  Communication Skills Housing Leisure Time Physical Health Resilience Social Support  ADL's:  Intact  Cognition: WNL  Sleep:        Observation Level/Precautions:  Continuous Observation  Laboratory:  per edp  Psychotherapy:    Medications:    Consultations:    Discharge Concerns:    Estimated LOS:  Other:     Treatment Plan Summary: Daily contact with patient to assess and evaluate symptoms and progress in treatment, Medication management She wants to be discharged home Discussed Alcohol Residential Treatment Facility and encouraged seek placement from [facility]  Initiate Medication Wants refill but has "no showed" on her outpatient appointments  Disposition Possible discharge home but encouraged to consider discharge to alcohol residential treatment facility.    Samantha Crimes, PMHNP-BC 1/13/20176:29 AM

## 2015-02-21 NOTE — Progress Notes (Signed)
[facility] INPATIENT:  Family/Significant Other Suicide Prevention Education  Suicide Prevention Education:  Patient Refusal for Family/Significant Other Suicide Prevention Education: The patient Anne Black has refused to provide written consent for family/significant other to be provided Family/Significant Other Suicide Prevention Education during admission and/or prior to discharge.    Angeline Slim M 02/21/2015, 0050am

## 2015-03-14 ENCOUNTER — Telehealth (HOSPITAL_COMMUNITY): Payer: Self-pay

## 2015-03-14 NOTE — Telephone Encounter (Signed)
Patient called this morning and stated that the last time she met with Dr. Salem Senate that she would not prescribe Klonopin unless the patient agreed to have the ACT team come to her house 3 times a week. Patient declined at that time. This morning she stated that she changed her mind and that she would be willing to do that and wanted a prescription today. I explained to the patient that Dr. Salem Senate was not in today but that I could send a message to her and we can discuss on Monday. Patient said "nevermind, I need meds now" and hung up the phone. This CMA did call the patient back to make sure that she was okay. The patient said she was fine, she was just stressed. I reminded her that she could always come here or to her local ED if she was in crisis. Patient thanked me for caring and said that she was working with a new facility and she was comfortable there. She said she would call if she needed anything.

## 2015-03-19 NOTE — Telephone Encounter (Signed)
This case has been closed. She was referred to [facility] or [facility]

## 2015-04-06 ENCOUNTER — Ambulatory Visit (INDEPENDENT_AMBULATORY_CARE_PROVIDER_SITE_OTHER): Payer: Managed Care, Other (non HMO) | Admitting: Physician Assistant

## 2015-04-06 ENCOUNTER — Ambulatory Visit (INDEPENDENT_AMBULATORY_CARE_PROVIDER_SITE_OTHER): Payer: Managed Care, Other (non HMO)

## 2015-04-06 VITALS — BP 126/84 | HR 105 | Temp 98.6°F | Resp 18

## 2015-04-06 DIAGNOSIS — R059 Cough, unspecified: Secondary | ICD-10-CM

## 2015-04-06 DIAGNOSIS — R05 Cough: Secondary | ICD-10-CM | POA: Diagnosis not present

## 2015-04-06 DIAGNOSIS — F411 Generalized anxiety disorder: Secondary | ICD-10-CM | POA: Diagnosis not present

## 2015-04-06 DIAGNOSIS — M545 Low back pain, unspecified: Secondary | ICD-10-CM

## 2015-04-06 DIAGNOSIS — R110 Nausea: Secondary | ICD-10-CM | POA: Diagnosis not present

## 2015-04-06 MED ORDER — CYCLOBENZAPRINE HCL 10 MG PO TABS: 5.0000 mg | ORAL_TABLET | Freq: Three times a day (TID) | ORAL | Status: DC | PRN

## 2015-04-06 MED ORDER — PANTOPRAZOLE SODIUM 40 MG PO TBEC: 40.0000 mg | DELAYED_RELEASE_TABLET | Freq: Two times a day (BID) | ORAL | Status: DC

## 2015-04-06 MED ORDER — CLONAZEPAM 1 MG PO TABS: 1.0000 mg | ORAL_TABLET | Freq: Two times a day (BID) | ORAL | Status: DC | PRN

## 2015-04-06 MED ORDER — HYDROCOD POLST-CPM POLST ER 10-8 MG/5ML PO SUER: 5.0000 mL | Freq: Two times a day (BID) | ORAL | Status: DC | PRN

## 2015-04-06 MED ORDER — ONDANSETRON HCL 4 MG PO TABS: 4.0000 mg | ORAL_TABLET | Freq: Three times a day (TID) | ORAL | Status: DC | PRN

## 2015-04-06 MED ORDER — FLUOXETINE HCL 20 MG PO CAPS: 20.0000 mg | ORAL_CAPSULE | Freq: Every day | ORAL | Status: DC

## 2015-04-06 NOTE — Progress Notes (Signed)
Patient ID: Anne Black, female    DOB: 12/24/1960, 55 y.o.   MRN: [id number]  PCP: No primary care provider on file.  Subjective:   Chief Complaint  Patient presents with  . Medication Refill    zofran  . Anxiety  . Cough  . Back Pain    HPI Presents for medication refills.  She wants a refill of Zofran for nausea, something for anxiety, something for cough and something for back pain, and specifically requests medications she desires for each.  She has no PCP, sees specialists at [facility] and [facility]. Isn't taking most of the medications on her list, but desires many of them refilled. Her psychiatrist retired. Sorting her medications was difficult, and she initially answered that she was taking all of the ones listed, only later to say that she was out of many, or that she was previously prescribed them, and isn't clear about the doses. For example, she doesn't know whether she takes Advair 100/50 or 250/50, and both are on her medication list. She states that the Prozac is 40 mg, which is too much for her and makes her feel confused, and because it's a capsule, she can't break it, so she isn't taking it. However, the 20 mg capsule is listed.  1. Nausea is a long-standing, chronic problem. She has GERD. Only taking pantoprazole QD, though it's prescribed BID. She tells me that she's seen Dr. Loreta Ave for GI evaluation. There are notes in the record from [facility] 2015 and prior.  2. Cough. This is difficult to sort out. It began about a month ago. She had two [facility] visits, diagnosed with AOM and sinusitis and then bronchitis. Initially prescribed amoxicillin, then Levaquin. "I've been on the top tiered, most potent antibiotic. I've been on tessalon perles." Cough was so bad that she couldn't have the MRI of the brain and C-spine ordered by her neurologist. "I don't know that I can ever lie in that thing and not cough at all. The xray tech said she didn't think those things (tessalon  perles) worked." "I mostly cough at night when [employer] down. It's productive, I'm spitting up stuff."  Doesn't look at the color. Nasal drainage was green and blood tinged. "That's getting better with the levaquin." No associated fever, chills. Increased nausea with swallowing mucous. "Feels like it's cliniging to the back of my throat. Like it's stuck."  3. Diarrhea. Isn't able to tell me if it's new, associated with the cough, or if it's an ongoing problem. She's had MS flares with associated diarrhea. Using immodium PRN.  4. "The main thing I'm concerned about is these anxiety attacks. I'm up at 2-4-6 and can't go back to sleep. I'm not doing anything consistently. I'm going through a difficult time. I have too much on me. I need an anxiety product, even for just one month. Buspar is too short acting compared with the Klonopin and I sit on the edge of the bed and can't breathe. Elavil. I'm not crazy about taking it. It's so old. There are newer things. I'd prefer something that is weight neutral. Vyvanse really helped. Without it I'm more impulsive. More reactive. I can't stand to stand in line. I'm impatient. I'm not very tolerant, especially of myself." Most recent hospital admission was 02/21/2015 with acute alcohol intoxication and bipolar disorder episode, but she tells me that she hasn't seen psychiatry in a long time.  5. Back pain. Lower back and RIGHT hip. "Of course the  fact that I'm not exercising may have something to do with it. There is a tightening." Using meloxicam, either 7.5 or 15 mg daily, she isn't sure. Went back to it, despite her nausea, due to the pain. Sometimes uses lidoderm patches.  6. MS. Followed by neurology at [facility]. Previously seen by Dr. Anne Hahn at [facility], but not recently. Starting to carry an assistive device with her for ambulation-needing it more. Looking for a part-time helper   Review of Systems  Constitutional: Negative for fever, chills, diaphoresis,  activity change, appetite change, fatigue and unexpected weight change.  HENT: Negative for congestion, ear pain, sinus pressure, sneezing, sore throat and trouble swallowing.   Eyes: Negative for photophobia and visual disturbance.  Respiratory: Positive for cough. Negative for chest tightness, shortness of breath and wheezing.   Cardiovascular: Negative for chest pain, palpitations and leg swelling.  Gastrointestinal: Positive for nausea and diarrhea. Negative for vomiting.  Genitourinary: Negative for dysuria, urgency and frequency.  Musculoskeletal: Positive for back pain, arthralgias and gait problem. Negative for neck pain and neck stiffness.  Skin: Negative for rash.  Neurological: Positive for weakness. Negative for dizziness, syncope, light-headedness and headaches.  Hematological: Negative for adenopathy. Does not bruise/bleed easily.  Psychiatric/Behavioral: Positive for sleep disturbance and decreased concentration. Negative for suicidal ideas, hallucinations, behavioral problems, self-injury and agitation. The patient is nervous/anxious.        Patient Active Problem List   Diagnosis Date Noted  . Stimulant abuse 12/27/2014  . Nicotine abuse 12/27/2014  . Hyperprolactinemia (HCC) 11/15/2014  . Bipolar disorder, curr episode mixed, severe, w/o psychotic features (HCC) 11/13/2014  . Dyslipidemia   . Alcohol use disorder, moderate, dependence (HCC) 10/30/2014  . Noncompliance with therapeutic plan 04/04/2013  . Unspecified hereditary and idiopathic peripheral neuropathy 05/01/2012  . GERD (gastroesophageal reflux disease) 05/01/2012  . Osteoarthrosis, unspecified whether generalized or localized, involving lower leg 05/01/2012  . MS (multiple sclerosis) (HCC) 09/14/2011     Prior to Admission medications   Medication Sig Start Date End Date Taking? Authorizing Provider  ADVAIR DISKUS 250-50 MCG/DOSE AEPB INL 1 PUFF PO BID 03/05/15  Yes Historical Provider, MD  albuterol  (PROVENTIL HFA;VENTOLIN HFA) 108 (90 BASE) MCG/ACT inhaler Inhale 2 puffs into the lungs every 6 (six) hours as needed for wheezing. 02/28/14  Yes Tiffany L Reed, DO  amantadine (SYMMETREL) 100 MG capsule Take 1 capsule (100 mg total) by mouth 2 (two) times daily. Reported on 02/20/2015 02/21/15  Yes Thermon Leyland, NP  diphenhydrAMINE (BENADRYL) 25 MG tablet Take 25 mg by mouth at bedtime as needed for sleep.   Yes Historical Provider, MD  Fluticasone-Salmeterol (ADVAIR) 100-50 MCG/DOSE AEPB Inhale 1 puff into the lungs 2 (two) times daily. 02/21/15  Yes Thermon Leyland, NP  hydrochlorothiazide (HYDRODIURIL) 12.5 MG tablet Take 1 tablet (12.5 mg total) by mouth daily. Reported on 02/20/2015 02/21/15  Yes Thermon Leyland, NP  meloxicam [facility]) 7.5 MG tablet  03/17/15  Yes Historical Provider, MD  ondansetron (ZOFRAN) 4 MG tablet Take by mouth. 12/18/13  Yes Historical Provider, MD  pantoprazole (PROTONIX) 40 MG tablet Take 1 tablet (40 mg total) by mouth 2 (two) times daily. 02/21/15  Yes Thermon Leyland, NP  risperiDONE (RISPERDAL) 0.5 MG tablet Take 5 tablets (2.5 mg total) by mouth at bedtime. 02/21/15  Yes Thermon Leyland, NP  rizatriptan (MAXALT) 10 MG tablet Take 10 mg by mouth daily as needed for migraine. Reported on 02/20/2015 09/05/14  Yes Historical Provider, MD  topiramate (TOPAMAX) 100 MG tablet Take 1 tablet (100 mg total) by mouth daily. 02/21/15  Yes Thermon Leyland, NP  VITAMIN D, ERGOCALCIFEROL, PO Take by mouth.   Yes Historical Provider, MD  amitriptyline (ELAVIL) 50 MG tablet Reported on 04/06/2015 03/18/15   Historical Provider, MD  benztropine (COGENTIN) 0.5 MG tablet Take 1 tablet (0.5 mg total) by mouth at bedtime. Patient not taking: Reported on 04/06/2015 02/21/15   Thermon Leyland, NP  FLUoxetine (PROZAC) 20 MG capsule Take 1 capsule (20 mg total) by mouth daily. Reported on 02/20/2015 Patient not taking: Reported on 04/06/2015 02/21/15   Thermon Leyland, NP  gabapentin (NEURONTIN) 100 MG capsule Take 1  capsule (100 mg total) by mouth 3 (three) times daily as needed. Reported on 02/20/2015 Patient not taking: Reported on 04/06/2015 02/21/15   Thermon Leyland, NP  hydrOXYzine (ATARAX/VISTARIL) 25 MG tablet Take 1 tablet (25 mg total) by mouth 3 (three) times daily as needed (sleep). Patient not taking: Reported on 04/06/2015 02/21/15   Thermon Leyland, NP  lidocaine (LIDODERM) 5 % Place 1 patch onto the skin daily as needed (for area underarm for redness/swelling/pain.). Reported on 04/06/2015    Historical Provider, MD  lisdexamfetamine (VYVANSE) 50 MG capsule Take 1 capsule (50 mg total) by mouth daily. Reported on 02/20/2015 Patient not taking: Reported on 04/06/2015 02/21/15   Thermon Leyland, NP  modafinil (PROVIGIL) 200 MG tablet Take 1 tablet (200 mg total) by mouth daily. Reported on 02/20/2015 Patient not taking: Reported on 04/06/2015 02/21/15   Thermon Leyland, NP  nicotine (NICODERM CQ - DOSED IN MG/24 HOURS) 21 mg/24hr patch Place 1 patch (21 mg total) onto the skin daily. Patient not taking: Reported on 04/06/2015 02/21/15   Thermon Leyland, NP  traZODone (DESYREL) 100 MG tablet Take 1 tablet (100 mg total) by mouth at bedtime as needed for sleep. Patient not taking: Reported on 04/06/2015 02/21/15   Thermon Leyland, NP     No Known Allergies     Objective:  Physical Exam  Constitutional: She is oriented to person, place, and time. She appears well-developed and well-nourished. She is active and cooperative. No distress.  BP 126/84 mmHg  Pulse 105  Temp(Src) 98.6 F (37 C)  Resp 18  SpO2 98% Mildly tremulous.  HENT:  Head: Normocephalic and atraumatic.  Right Ear: Hearing normal.  Left Ear: Hearing normal.  Eyes: Conjunctivae are normal. No scleral icterus.  Neck: Normal range of motion. Neck supple. No thyromegaly present.  Cardiovascular: Normal rate, regular rhythm and normal heart sounds.   Pulses:      Radial pulses are 2+ on the right side, and 2+ on the left side.  Pulmonary/Chest:  Effort normal and breath sounds normal.  Lymphadenopathy:       Head (right side): No tonsillar, no preauricular, no posterior auricular and no occipital adenopathy present.       Head (left side): No tonsillar, no preauricular, no posterior auricular and no occipital adenopathy present.    She has no cervical adenopathy.       Right: No supraclavicular adenopathy present.       Left: No supraclavicular adenopathy present.  Neurological: She is alert and oriented to person, place, and time. She has normal strength. No sensory deficit.  Skin: Skin is warm, dry and intact. No rash noted. No cyanosis or erythema. Nails show no clubbing.  Distal fingers and nails are yellow-stained.  Psychiatric: She has a normal mood  and affect. Her speech is normal and behavior is normal. She expresses impulsivity. She expresses no homicidal and no suicidal ideation.       Dg Chest 2 View  04/06/2015  CLINICAL DATA:  55 year old female with cough for 1 month. EXAM: CHEST  2 VIEW COMPARISON:  02/20/2015 and prior chest radiographs dating back to 11/28/2009 FINDINGS: Cardiomegaly identified. There is no evidence of focal airspace disease, pulmonary edema, suspicious pulmonary nodule/mass, pleural effusion, or pneumothorax. No acute bony abnormalities are identified. IMPRESSION: Cardiomegaly without evidence of active cardiopulmonary disease. Electronically Signed   By: Harmon Pier M.D.   On: 04/06/2015 09:26       Assessment & Plan:   1. Cough Post-viral. No evidence of infection on CXR. Supportive care. - DG Chest 2 View; Future - chlorpheniramine-HYDROcodone (TUSSIONEX PENNKINETIC ER) 10-8 MG/5ML SUER; Take 5 mLs by mouth every 12 (twelve) hours as needed for cough.  Dispense: 100 mL; Refill: 0  2. Nausea without vomiting Likely due to uncontrolled GERD. Increase PPI to BID and continue zofran for now. If symptoms persist, will need re-evaluation with GI. - ondansetron (ZOFRAN) 4 MG tablet; Take 1 tablet  (4 mg total) by mouth every 8 (eight) hours as needed for nausea or vomiting.  Dispense: 30 tablet; Refill: 0 - pantoprazole (PROTONIX) 40 MG tablet; Take 1 tablet (40 mg total) by mouth 2 (two) times daily.  Dispense: 60 tablet; Refill: 0  3. Bilateral low back pain without sciatica She will continue meloxicam at the 7.5 mg dose, understanding that she should stop it as soon as she is able, as it can exacerbate the GERD. Cyclobenzaprine has worked well previously for the same. - cyclobenzaprine (FLEXERIL) 10 MG tablet; Take 0.5-1 tablets (5-10 mg total) by mouth 3 (three) times daily as needed for muscle spasms.  Dispense: 30 tablet; Refill: 0  4. Anxiety state REfill fluoxetine 20 mg. Agree to a short course of clonazepam, but additional treatment for this problem needs to come from psychiatry. She has someone in mind and believes she can get an appointment in the next week. - FLUoxetine (PROZAC) 20 MG capsule; Take 1 capsule (20 mg total) by mouth daily. Reported on 02/20/2015  Dispense: 90 capsule; Refill: 0 - clonazePAM (KLONOPIN) 1 MG tablet; Take 1 tablet (1 mg total) by mouth 2 (two) times daily as needed for anxiety.  Dispense: 30 tablet; Refill: 0   She needs to follow-up with neurology as well. She requested a refill of Neurontin, as a mood stabilizer, which I declined, as she is on topiramate. I also declined her request for a refill of Vyvanse.  Fernande Bras, PA-C Physician Assistant-Certified [facility]

## 2015-04-06 NOTE — Progress Notes (Deleted)
Dg Chest 2 View  04/06/2015  CLINICAL DATA:  55 year old female with cough for 1 month. EXAM: CHEST  2 VIEW COMPARISON:  02/20/2015 and prior chest radiographs dating back to 11/28/2009 FINDINGS: Cardiomegaly identified. There is no evidence of focal airspace disease, pulmonary edema, suspicious pulmonary nodule/mass, pleural effusion, or pneumothorax. No acute bony abnormalities are identified. IMPRESSION: Cardiomegaly without evidence of active cardiopulmonary disease. Electronically Signed   By: Harmon Pier M.D.   On: 04/06/2015 09:26

## 2015-04-06 NOTE — Patient Instructions (Addendum)
INCREASE the Protonix to twice daily.  Schedule with a psychiatrist as soon as you can. Please continue to follow-up with neurologist.  I think that at least a part of your cough is due to reflux. Things that often make reflux symptoms worse: Caffeine Carbonation (soda) Spicy foods Acidic foods (like tomato sauce, orange juice, lemonade) Fatty foods (including whole milk and ice cream) Stress (feeling sad, worried, nervous) Nicotine Alcohol NSAIDS (non-steroidal anti-inflammatories, like ibuprofen (Advil, Motrin) or naproxen (Aleve)).

## 2015-04-07 ENCOUNTER — Other Ambulatory Visit: Payer: Self-pay | Admitting: Internal Medicine

## 2015-04-09 ENCOUNTER — Telehealth: Payer: Self-pay

## 2015-04-09 NOTE — Telephone Encounter (Signed)
Pt called stating that she is experiencing urine and bowel incontinence// she is req. Something stronger than the imodium that she has been prescribed///  [phone] vm ok please cb

## 2015-04-10 ENCOUNTER — Telehealth: Payer: Self-pay

## 2015-04-10 NOTE — Telephone Encounter (Signed)
Pt wants to know if a small solumedrol dose pack could get cough under control so she can have a MRI so she can get a modified dose treatment.  Please advise   [phone]

## 2015-04-10 NOTE — Telephone Encounter (Signed)
Spoke with pt, she seemed reluctant to talk. She took 4 Immodium and she is still not all the way better. She will need something else called in. Is there anything else we can send in?

## 2015-04-11 NOTE — Telephone Encounter (Signed)
Please review

## 2015-04-11 NOTE — Telephone Encounter (Signed)
When I saw her, she did not tell me about these issues.  The urinary incontinence could represent and infection, for which she needs to be evaluated. The stool incontinence is a separate issue. She reported diarrhea related to her MS. If she has ever had stool incontinence before, it may be that. I recommend she contact her GI or neurology specialist. If they cannot send in something, she should come into our office for closer evaluation of this problem.

## 2015-04-14 ENCOUNTER — Telehealth: Payer: Self-pay

## 2015-04-14 DIAGNOSIS — F411 Generalized anxiety disorder: Secondary | ICD-10-CM

## 2015-04-14 NOTE — Telephone Encounter (Signed)
I do not prescribe these medications, except in very rare occasions, as I explained to the patient when she was here. She was to schedule with a psychiatrist and see her neurologist to discuss restarting these medications to treat her conditions.

## 2015-04-14 NOTE — Telephone Encounter (Signed)
Possibly, she needs to return.  She needs to return if cough is not resolving.  She can also use her cough medicine if this is close, and she chooses not to return.  Please inform patient.

## 2015-04-14 NOTE — Telephone Encounter (Signed)
Would you be willing to Rx this if I get the correct mg?

## 2015-04-14 NOTE — Telephone Encounter (Addendum)
Pt states she forgot to ask Chelle for some COGENTIN 0.5 MG and some LAMICTAL isn't sure about the MG because she got it from her [facility]. Please call [phone]   [facility] IN [place]

## 2015-04-15 ENCOUNTER — Other Ambulatory Visit: Payer: Self-pay

## 2015-04-15 MED ORDER — FLUOXETINE HCL 20 MG PO CAPS: 20.0000 mg | ORAL_CAPSULE | Freq: Every day | ORAL | Status: DC

## 2015-04-15 NOTE — Telephone Encounter (Signed)
Rx for prozac sent. Pt states the pharmacy did not get it. Pt understood message from Chelle.

## 2015-04-15 NOTE — Telephone Encounter (Signed)
Left message for pt to call back  °

## 2015-04-22 ENCOUNTER — Telehealth: Payer: Self-pay

## 2015-04-22 NOTE — Telephone Encounter (Signed)
PT would like call back due a fecal impaction.. Pt was offered walkin hrs as C.Leotis Shames is out of office// pt declined and req. Call back from clinical staff. It was also suggested that PT reach out to pharmacist for symptom relief.   Please call to consult [phone] pt was advised of 72 business hr ph call turnaround time

## 2015-04-22 NOTE — Telephone Encounter (Signed)
Pt is needing a refill on her hydrocodone cough medication   Best number [phone]

## 2015-04-22 NOTE — Telephone Encounter (Signed)
Previous Tele. Message accidentally closed in error...  Anne Black  04/22/2015  Telephone  MRN:  [id number]   Description: 55 year old female  Provider: Vicenta Dunning, RN  Department: [facility]       Reason for Call     Advice Only         Call Documentation      [name] at 04/22/2015 1:59 PM     Status: Signed       Expand All Collapse All   PT would like call back due a fecal impaction.. Pt was offered walkin hrs as C.Leotis Shames is out of office// pt declined and req. Call back from clinical staff. It was also suggested that PT reach out to pharmacist for symptom relief.   Please call to consult [phone] pt was advised of 72 business hr ph call turnaround time            Waymon Amato at 04/22/2015 1:15 PM     Status: Signed       Expand All Collapse All   Pt is needing a refill on her hydrocodone cough medication   Best number [phone]             Encounter MyChart Messages     No messages in this encounter     Routing History     Priority Sent On From To Message Type     04/22/2015 1:16 PM Waymon Amato [facility] Rx Refill Pool Patient Calls      Created by     Waymon Amato on 04/22/2015 01:15 PM     Visit Pharmacy     [id number] - [place], [place] - [address] [place] [address]     Contacts       Type Contact Phone    04/22/2015 01:15 PM Phone (Incoming) [name], [name] (Self) [phone] (H)

## 2015-04-23 NOTE — Telephone Encounter (Signed)
Left message for pt to call back  °

## 2015-04-25 ENCOUNTER — Telehealth: Payer: Self-pay

## 2015-04-25 NOTE — Telephone Encounter (Signed)
Patient is requesting cough syrup and pearl drops.   [phone]

## 2015-04-28 NOTE — Telephone Encounter (Signed)
Pt should RTC if symptoms not improving.

## 2015-05-08 NOTE — Telephone Encounter (Signed)
Pt  Called stating that she feels she needs a rx order for a mood stabilizer- Lamictal   [phone]

## 2015-05-09 ENCOUNTER — Telehealth: Payer: Self-pay

## 2015-05-09 NOTE — Telephone Encounter (Signed)
4. Anxiety state REfill fluoxetine 20 mg. Agree to a short course of clonazepam, but additional treatment for this problem needs to come from psychiatry. She has someone in mind and believes she can get an appointment in the next week. - FLUoxetine (PROZAC) 20 MG capsule; Take 1 capsule (20 mg total) by mouth daily. Reported on 02/20/2015 Dispense: 90 capsule; Refill: 0 - clonazePAM (KLONOPIN) 1 MG tablet; Take 1 tablet (1 mg total) by mouth 2 (two) times daily as needed for anxiety. Dispense: 30 tablet; Refill: 0   She needs to follow-up with neurology as well. She requested a refill of Neurontin, as a mood stabilizer, which I declined, as she is on topiramate. I also declined her request for a refill of Vyvanse.  SPoke with pt, she states she is very sad right now. I advised her that she needs to call psychiatry for mood stabilizer. Pt agreed and will call today. She admits her attitude is not great when it comes to this but she will do it. I advised her that psychiatry would need to follow her on a mood stabilizer and that it is too complex for [place] right now. Pt understood.

## 2015-05-09 NOTE — Telephone Encounter (Signed)
Please remind this patient of the conversation she and I had when she was here 04/06/2015. See also phone message 04/14/2015.  She needs to establish with psychiatry. She had someone in mind. If that is no longer the case, I am happy to provide some names of local psychiatrists. She saw Dr. Lucianne Muss during an admission in January, and saw Dr. Ella Jubilee in 12/2014.

## 2015-05-09 NOTE — Telephone Encounter (Signed)
Patient states her stomach is killing her. She is requesting a refill on Zofran. [facility] [place] [phone].

## 2015-05-11 ENCOUNTER — Other Ambulatory Visit: Payer: Self-pay | Admitting: Emergency Medicine

## 2015-05-11 ENCOUNTER — Telehealth: Payer: Self-pay | Admitting: Emergency Medicine

## 2015-05-11 NOTE — Telephone Encounter (Signed)
Patient called at 7 PM Saturday night  stating she was having worsening heartburn  Despite being on Protonix. She called requesting Phenergan and GI cocktail. I told her I could not call in these medicines. I did call in  Carafate 2 teaspoons 4 times a day on an empty stomach as well as Zofran ODT 4 mg. I also advised her she could take extra Pepcid in addition to her PPI. I advised her to follow-up with Chelle  on Monday or she could be seen at urgent care tomorrow morning.

## 2015-05-12 ENCOUNTER — Telehealth: Payer: Self-pay

## 2015-05-12 ENCOUNTER — Telehealth: Payer: Self-pay | Admitting: Emergency Medicine

## 2015-05-12 NOTE — Telephone Encounter (Signed)
Patient states that she can not sleep at night very well and really needs this meds because she can not get into anywhere, she [facility] and also someone by the name of Boykin Peek, and neither of those places could see her anytime soon. So she states she needs another refill until she can get in to be seen, patient states she was not referred anywhere and she would like to be, could we do that?  Her call back number is [phone]

## 2015-05-12 NOTE — Telephone Encounter (Signed)
Patient called requesting refill of Klonopin. This was denied. Patient needs to return to clinic to discuss what medications are appropriate for her to take

## 2015-05-12 NOTE — Telephone Encounter (Signed)
As discussed at her visit with me on 2/26, she needs to be seen by psychiatry for this issue. I provided a short course supply, and she was to schedule with psychiatry.  When/who is she scheduled?

## 2015-05-12 NOTE — Telephone Encounter (Signed)
Anne Gobble, MD at 05/11/2015 7:27 AM     Status: Signed       Expand All Collapse All   Patient called at 7 PM Saturday night stating she was having worsening heartburn Despite being on Protonix. She called requesting Phenergan and GI cocktail. I told her I could not call in these medicines. I did call in Carafate 2 teaspoons 4 times a day on an empty stomach as well as Zofran ODT 4 mg. I also advised her she could take extra Pepcid in addition to her PPI. I advised her to follow-up with Chelle on Monday or she could be seen at urgent care tomorrow morning.           Needs to RTC. FYI Chelle

## 2015-05-13 ENCOUNTER — Emergency Department (HOSPITAL_COMMUNITY)
Admission: EM | Admit: 2015-05-13 | Discharge: 2015-05-13 | Disposition: A | Discharge status: Home / Self Care | Payer: Managed Care, Other (non HMO) | Attending: Emergency Medicine | Admitting: Emergency Medicine

## 2015-05-13 ENCOUNTER — Encounter (HOSPITAL_COMMUNITY): Payer: Self-pay

## 2015-05-13 ENCOUNTER — Emergency Department (HOSPITAL_COMMUNITY): Payer: Managed Care, Other (non HMO)

## 2015-05-13 DIAGNOSIS — R1084 Generalized abdominal pain: Secondary | ICD-10-CM | POA: Diagnosis not present

## 2015-05-13 DIAGNOSIS — Z7951 Long term (current) use of inhaled steroids: Secondary | ICD-10-CM | POA: Diagnosis not present

## 2015-05-13 DIAGNOSIS — Z79899 Other long term (current) drug therapy: Secondary | ICD-10-CM | POA: Diagnosis not present

## 2015-05-13 DIAGNOSIS — M810 Age-related osteoporosis without current pathological fracture: Secondary | ICD-10-CM | POA: Diagnosis not present

## 2015-05-13 DIAGNOSIS — M1990 Unspecified osteoarthritis, unspecified site: Secondary | ICD-10-CM | POA: Diagnosis not present

## 2015-05-13 DIAGNOSIS — F909 Attention-deficit hyperactivity disorder, unspecified type: Secondary | ICD-10-CM | POA: Insufficient documentation

## 2015-05-13 DIAGNOSIS — R197 Diarrhea, unspecified: Secondary | ICD-10-CM | POA: Insufficient documentation

## 2015-05-13 DIAGNOSIS — F319 Bipolar disorder, unspecified: Secondary | ICD-10-CM | POA: Diagnosis not present

## 2015-05-13 DIAGNOSIS — Z8669 Personal history of other diseases of the nervous system and sense organs: Secondary | ICD-10-CM | POA: Insufficient documentation

## 2015-05-13 DIAGNOSIS — F17210 Nicotine dependence, cigarettes, uncomplicated: Secondary | ICD-10-CM | POA: Diagnosis not present

## 2015-05-13 DIAGNOSIS — Z8679 Personal history of other diseases of the circulatory system: Secondary | ICD-10-CM | POA: Diagnosis not present

## 2015-05-13 DIAGNOSIS — F419 Anxiety disorder, unspecified: Secondary | ICD-10-CM | POA: Diagnosis not present

## 2015-05-13 DIAGNOSIS — R531 Weakness: Secondary | ICD-10-CM | POA: Diagnosis present

## 2015-05-13 LAB — CBC WITH DIFFERENTIAL/PLATELET
BASOS PCT: 0 %
Basophils Absolute: 0.1 10*3/uL (ref 0.0–0.1)
EOS ABS: 0 10*3/uL (ref 0.0–0.7)
Eosinophils Relative: 0 %
HEMATOCRIT: 37.2 % (ref 36.0–46.0)
Hemoglobin: 13.2 g/dL (ref 12.0–15.0)
LYMPHS ABS: 1.1 10*3/uL (ref 0.7–4.0)
Lymphocytes Relative: 8 %
MCH: 35.7 pg — AB (ref 26.0–34.0)
MCHC: 35.5 g/dL (ref 30.0–36.0)
MCV: 100.5 fL — ABNORMAL HIGH (ref 78.0–100.0)
MONO ABS: 0.9 10*3/uL (ref 0.1–1.0)
MONOS PCT: 7 %
NEUTROS ABS: 11.1 10*3/uL — AB (ref 1.7–7.7)
Neutrophils Relative %: 85 %
Platelets: 190 10*3/uL (ref 150–400)
RBC: 3.7 MIL/uL — ABNORMAL LOW (ref 3.87–5.11)
RDW: 18.4 % — ABNORMAL HIGH (ref 11.5–15.5)
WBC: 13.1 10*3/uL — ABNORMAL HIGH (ref 4.0–10.5)

## 2015-05-13 LAB — COMPREHENSIVE METABOLIC PANEL
ALBUMIN: 2.6 g/dL — AB (ref 3.5–5.0)
ALK PHOS: 327 U/L — AB (ref 38–126)
ALT: 94 U/L — AB (ref 14–54)
AST: 182 U/L — ABNORMAL HIGH (ref 15–41)
Anion gap: 17 — ABNORMAL HIGH (ref 5–15)
BILIRUBIN TOTAL: 7.9 mg/dL — AB (ref 0.3–1.2)
CALCIUM: 8.3 mg/dL — AB (ref 8.9–10.3)
CO2: 26 mmol/L (ref 22–32)
CREATININE: 0.45 mg/dL (ref 0.44–1.00)
Chloride: 90 mmol/L — ABNORMAL LOW (ref 101–111)
GFR calc Af Amer: >60 mL/min (ref >60)
GFR calc non Af Amer: >60 mL/min (ref >60)
GLUCOSE: 92 mg/dL (ref 65–99)
Potassium: 2.9 mmol/L — ABNORMAL LOW (ref 3.5–5.1)
SODIUM: 133 mmol/L — AB (ref 135–145)
TOTAL PROTEIN: 6.1 g/dL — AB (ref 6.5–8.1)

## 2015-05-13 LAB — LIPASE, BLOOD: Lipase: 47 U/L (ref 11–51)

## 2015-05-13 MED ORDER — LORAZEPAM 2 MG/ML IJ SOLN
1.0000 mg | Freq: Once | INTRAMUSCULAR | Status: AC
  Administered 2015-05-13: 1 mg via INTRAVENOUS
  Filled 2015-05-13: qty 1

## 2015-05-13 MED ORDER — SODIUM CHLORIDE 0.9 % IV BOLUS (SEPSIS)
1000.0000 mL | Freq: Once | INTRAVENOUS | Status: AC
  Administered 2015-05-13: 1000 mL via INTRAVENOUS

## 2015-05-13 MED ORDER — METHYLPREDNISOLONE SODIUM SUCC 125 MG IJ SOLR
125.0000 mg | Freq: Once | INTRAMUSCULAR | Status: AC
  Administered 2015-05-13: 125 mg via INTRAVENOUS
  Filled 2015-05-13: qty 2

## 2015-05-13 MED ORDER — METOCLOPRAMIDE HCL 5 MG/ML IJ SOLN
5.0000 mg | Freq: Once | INTRAMUSCULAR | Status: AC
  Administered 2015-05-13: 5 mg via INTRAVENOUS
  Filled 2015-05-13: qty 2

## 2015-05-13 MED ORDER — POTASSIUM CHLORIDE CRYS ER 20 MEQ PO TBCR
40.0000 meq | EXTENDED_RELEASE_TABLET | Freq: Once | ORAL | Status: AC
  Administered 2015-05-13: 40 meq via ORAL
  Filled 2015-05-13: qty 2

## 2015-05-13 MED ORDER — DIPHENHYDRAMINE HCL 50 MG/ML IJ SOLN
12.5000 mg | Freq: Once | INTRAMUSCULAR | Status: AC
  Administered 2015-05-13: 12.5 mg via INTRAVENOUS
  Filled 2015-05-13: qty 1

## 2015-05-13 MED ORDER — SODIUM CHLORIDE 0.9 % IV SOLN
INTRAVENOUS | Status: DC
  Administered 2015-05-13: 07:00:00 via INTRAVENOUS

## 2015-05-13 NOTE — ED Provider Notes (Signed)
10:27 AM On repeat evaluation I discussed all findings again with the patient. Specifically discussed her abnormal lab results. Several times I advocated for admission for further evaluation and management. Patient states that she is intent on going home, states that she will follow-up with primary care. She voices understanding of risks of leaving prior to complete evaluation. Patient discharged, per her request.  Gerhard Munch, MD 05/13/15 1028

## 2015-05-13 NOTE — Telephone Encounter (Signed)
Dr. Daub spoke with pt 

## 2015-05-13 NOTE — ED Notes (Signed)
Prior to patient's departure, she wanted information for follow up. Had discharge paperwork re-printed with a couple different names. Then the patient wanted the ER MD's information so she could come see him regularly. Informed patient that the ER doctor was not a family doctor and the gave her information on how to obtain one. Called the case manager to come give patient a list of resources on private duty home health nursing per patient request. Wheeled patient out to lobby so she could driver herself home.

## 2015-05-13 NOTE — ED Provider Notes (Signed)
CSN: [id number]     Arrival date & time 05/13/15  0335 History   First MD Initiated Contact with Patient 05/13/15 0407     Chief Complaint  Patient presents with  . Weakness     (Consider location/radiation/quality/duration/timing/severity/associated sxs/prior Treatment) HPI Comments: Patient here complaining of weakness and watery diarrhea and abdominal discomfort times several days. Her old record was reviewed extensively and patient does have a history of MS. Denies any fever, chills, urinary symptoms. Has called her Dr. for the abdominal discomfort which is long-standing and she was prescribed Carafate.For anxiety, patient takes Klonopin which she says she is out of. Review of the old chart shows that patient had a refill 5 days ago of 30 tablets which was a two-week supply. She is evasive of how she is out of her medication. Abdominal discomfort is epigastric and does not radiate to her back and is worse after she eats. Denies any history of gallbladder disease. She has a long-standing history of anxiety and has been encouraged to see a psychiatrist which she has not done yet. She does agree that she is more anxious but denies any suicidal or homicidal ideations.  Patient is a 55 y.o. female presenting with weakness. The history is provided by the patient and medical records.  Weakness    Past Medical History  Diagnosis Date  . Multiple sclerosis (HCC)   . Arthritis   . Anxiety   . Bipolar disorder (HCC)   . ADHD (attention deficit hyperactivity disorder)   . Migraine   . Osteoporosis    Past Surgical History  Procedure Laterality Date  . Cesarean section  [id number]  . Myringotomy with tube placement Bilateral   . Tonsillectomy     Family History  Problem Relation Age of Onset  . Heart disease Mother   . Heart disease Father   . Hypertension Father    Social History  Substance Use Topics  . Smoking status: Current Every Day Smoker -- 0.75 packs/day for 20 years    Types:  Cigarettes  . Smokeless tobacco: Never Used     Comment: is cutting back, not in the car, house  . Alcohol Use: 4.8 oz/week    2 Glasses of wine, 2 Cans of beer, 4 Standard drinks or equivalent per week     Comment: "I'm a social drinker." "I drink a glass of wine nightly."   OB History    No data available     Review of Systems  Neurological: Positive for weakness.  All other systems reviewed and are negative.     Allergies  Review of patient's allergies indicates no known allergies.  Home Medications   Prior to Admission medications   Medication Sig Start Date End Date Taking? Authorizing Provider  ADVAIR DISKUS 250-50 MCG/DOSE AEPB INL 1 PUFF PO BID 03/05/15   Historical Provider, MD  albuterol (PROVENTIL HFA;VENTOLIN HFA) 108 (90 BASE) MCG/ACT inhaler Inhale 2 puffs into the lungs every 6 (six) hours as needed for wheezing. 02/28/14   Tiffany L Reed, DO  amantadine (SYMMETREL) 100 MG capsule Take 1 capsule (100 mg total) by mouth 2 (two) times daily. Reported on 02/20/2015 02/21/15   Thermon Leyland, NP  amitriptyline (ELAVIL) 50 MG tablet Reported on 04/06/2015 03/18/15   Historical Provider, MD  benztropine (COGENTIN) 0.5 MG tablet Take 1 tablet (0.5 mg total) by mouth at bedtime. Patient not taking: Reported on 04/06/2015 02/21/15   Thermon Leyland, NP  chlorpheniramine-HYDROcodone [facility]) 10-8 MG/5ML  SUER Take 5 mLs by mouth every 12 (twelve) hours as needed for cough. 04/06/15   Chelle Jeffery, PA-C  clonazePAM (KLONOPIN) 1 MG tablet Take 1 tablet (1 mg total) by mouth 2 (two) times daily as needed for anxiety. 04/06/15   Chelle Jeffery, PA-C  cyclobenzaprine (FLEXERIL) 10 MG tablet Take 0.5-1 tablets (5-10 mg total) by mouth 3 (three) times daily as needed for muscle spasms. 04/06/15   Chelle Jeffery, PA-C  diphenhydrAMINE (BENADRYL) 25 MG tablet Take 25 mg by mouth at bedtime as needed for sleep.    Historical Provider, MD  FLUoxetine (PROZAC) 20 MG capsule Take 1  capsule (20 mg total) by mouth daily. Reported on 02/20/2015 04/15/15   Porfirio Oar, PA-C  Fluticasone-Salmeterol (ADVAIR) 100-50 MCG/DOSE AEPB Inhale 1 puff into the lungs 2 (two) times daily. 02/21/15   Thermon Leyland, NP  gabapentin (NEURONTIN) 100 MG capsule Take 1 capsule (100 mg total) by mouth 3 (three) times daily as needed. Reported on 02/20/2015 Patient not taking: Reported on 04/06/2015 02/21/15   Thermon Leyland, NP  hydrochlorothiazide (HYDRODIURIL) 12.5 MG tablet Take 1 tablet (12.5 mg total) by mouth daily. Reported on 02/20/2015 02/21/15   Thermon Leyland, NP  hydrOXYzine (ATARAX/VISTARIL) 25 MG tablet Take 1 tablet (25 mg total) by mouth 3 (three) times daily as needed (sleep). Patient not taking: Reported on 04/06/2015 02/21/15   Thermon Leyland, NP  lidocaine (LIDODERM) 5 % Place 1 patch onto the skin daily as needed (for area underarm for redness/swelling/pain.). Reported on 04/06/2015    Historical Provider, MD  lisdexamfetamine (VYVANSE) 50 MG capsule Take 1 capsule (50 mg total) by mouth daily. Reported on 02/20/2015 Patient not taking: Reported on 04/06/2015 02/21/15   Thermon Leyland, NP  meloxicam [facility]) 7.5 MG tablet  03/17/15   Historical Provider, MD  modafinil (PROVIGIL) 200 MG tablet Take 1 tablet (200 mg total) by mouth daily. Reported on 02/20/2015 Patient not taking: Reported on 04/06/2015 02/21/15   Thermon Leyland, NP  nicotine (NICODERM CQ - DOSED IN MG/24 HOURS) 21 mg/24hr patch Place 1 patch (21 mg total) onto the skin daily. Patient not taking: Reported on 04/06/2015 02/21/15   Thermon Leyland, NP  ondansetron (ZOFRAN) 4 MG tablet Take 1 tablet (4 mg total) by mouth every 8 (eight) hours as needed for nausea or vomiting. 04/06/15   Porfirio Oar, PA-C  pantoprazole (PROTONIX) 40 MG tablet Take 1 tablet (40 mg total) by mouth 2 (two) times daily. 04/06/15   Chelle Jeffery, PA-C  risperiDONE (RISPERDAL) 0.5 MG tablet Take 5 tablets (2.5 mg total) by mouth at bedtime. 02/21/15   Thermon Leyland,  NP  rizatriptan (MAXALT) 10 MG tablet Take 10 mg by mouth daily as needed for migraine. Reported on 02/20/2015 09/05/14   Historical Provider, MD  topiramate (TOPAMAX) 100 MG tablet Take 1 tablet (100 mg total) by mouth daily. 02/21/15   Thermon Leyland, NP  traZODone (DESYREL) 100 MG tablet Take 1 tablet (100 mg total) by mouth at bedtime as needed for sleep. Patient not taking: Reported on 04/06/2015 02/21/15   Thermon Leyland, NP  VITAMIN D, ERGOCALCIFEROL, PO Take by mouth.    Historical Provider, MD   BP 135/64 mmHg  Pulse 104  Temp(Src) 97.9 F (36.6 C) (Oral)  Resp 20  SpO2 96% Physical Exam  Constitutional: She is oriented to person, place, and time. She appears well-developed and well-nourished.  Non-toxic appearance. No distress.  HENT:  Head:  Normocephalic and atraumatic.  Eyes: Conjunctivae, EOM and lids are normal. Pupils are equal, round, and reactive to light.  Neck: Normal range of motion. Neck supple. No tracheal deviation present. No thyroid mass present.  Cardiovascular: Normal rate, regular rhythm and normal heart sounds.  Exam reveals no gallop.   No murmur heard. Pulmonary/Chest: Effort normal and breath sounds normal. No stridor. No respiratory distress. She has no decreased breath sounds. She has no wheezes. She has no rhonchi. She has no rales.  Abdominal: Soft. Normal appearance and bowel sounds are normal. She exhibits no distension. There is tenderness in the epigastric area. There is no rigidity, no rebound, no guarding and no CVA tenderness.    Musculoskeletal: Normal range of motion. She exhibits no edema or tenderness.  Neurological: She is alert and oriented to person, place, and time. She has normal strength. No cranial nerve deficit or sensory deficit. GCS eye subscore is 4. GCS verbal subscore is 5. GCS motor subscore is 6.  Skin: Skin is warm and dry. No abrasion and no rash noted.  Psychiatric: She has a normal mood and affect. Her speech is normal and  behavior is normal.  Nursing note and vitals reviewed.   ED Course  Procedures (including critical care time) Labs Review Labs Reviewed  CBC WITH DIFFERENTIAL/PLATELET  COMPREHENSIVE METABOLIC PANEL  LIPASE, BLOOD    Imaging Review No results found. I have personally reviewed and evaluated these images and lab results as part of my medical decision-making.   EKG Interpretation None      MDM   Final diagnoses:  None    Pt given fluids, ativan, and steroids  Feels better, labs noted , has elevated lfts , reccomended admission to pt and she is unsure Will feed patient and she will decide afterwards if she wants to stay Signed out to Dr. Jory Ee, MD 05/13/15 [phone]

## 2015-05-13 NOTE — Discharge Instructions (Signed)
As discussed, with your abnormal laboratory results, it is very important that you follow-up with your primary care team.  Is difficult today to ensure that your evaluation continues.  Although you have chosen to leave the emergency department, prior to complete evaluation, do not hesitate to return here if you develop any new, or concerning changes in your condition.   Abdominal Pain, Adult Many things can cause abdominal pain. Usually, abdominal pain is not caused by a disease and will improve without treatment. It can often be observed and treated at home. Your health care provider will do a physical exam and possibly order blood tests and X-rays to help determine the seriousness of your pain. However, in many cases, more time must pass before a clear cause of the pain can be found. Before that point, your health care provider may not know if you need more testing or further treatment. HOME CARE INSTRUCTIONS Monitor your abdominal pain for any changes. The following actions may help to alleviate any discomfort you are experiencing:  Only take over-the-counter or prescription medicines as directed by your health care provider.  Do not take laxatives unless directed to do so by your health care provider.  Try a clear liquid diet (broth, tea, or water) as directed by your health care provider. Slowly move to a bland diet as tolerated. SEEK MEDICAL CARE IF:  You have unexplained abdominal pain.  You have abdominal pain associated with nausea or diarrhea.  You have pain when you urinate or have a bowel movement.  You experience abdominal pain that wakes you in the night.  You have abdominal pain that is worsened or improved by eating food.  You have abdominal pain that is worsened with eating fatty foods.  You have a fever. SEEK IMMEDIATE MEDICAL CARE IF:  Your pain does not go away within 2 hours.  You keep throwing up (vomiting).  Your pain is felt only in portions of the abdomen,  such as the right side or the left lower portion of the abdomen.  You pass bloody or black tarry stools. MAKE SURE YOU:  Understand these instructions.  Will watch your condition.  Will get help right away if you are not doing well or get worse.   This information is not intended to replace advice given to you by your health care provider. Make sure you discuss any questions you have with your health care provider.   Document Released: 11/04/2004 Document Revised: 10/16/2014 Document Reviewed: 10/04/2012 Elsevier Interactive Patient Education [employer]

## 2015-05-13 NOTE — ED Notes (Addendum)
Patient had a BM in her pants. Helped patient to get cleaned up. Changed gown/sheets, wiped patient down. Gave patient new scrubs to wear, socks, and underwear. Pt stated she wasn't able to properly wipe herself, asked patient if she needed help at home, stated no she had plans on interviewing some people for that position. Did not want to speak with case manager.

## 2015-05-13 NOTE — Progress Notes (Signed)
Pt D/c ED CM consulted by ED RN, McKenzie to assist pt with a list of PDN (private Duty nursing) staff  Pt came up to ED RN to ask her to wipe her bottom Pt walked from ED bathroom out side of her room - Pt in a blue ED scrub outfit because ED Rn confirmed pt had a stool on herself and had to be given clothes ED RN refused  Pt described having issues reaching to clean herself - Cm recommended pt go to [facility] to request a reacher (DME)  Pt given a list of PDN Pt with [employer] managed insurance  CM asked pt x 2 who her pcp was and she was not able to tell Cm who pcp is  CM educated pt to go to the back of her [employer] card to call the toll free number to ask for an [employer] in network pcp to be seen by and to get the f/u care recommended to her  Pt voiced understanding

## 2015-05-13 NOTE — ED Notes (Signed)
Pt complains of weakness, diarrhea, and severe stomach pain for several days, hx of MS and she states that her sx are getting worse, she currently doesn't have any MS medication.

## 2015-05-13 NOTE — ED Notes (Signed)
Pt adds that she has not been able to sleep due to the abd pain, reflux, and MS and is exhausted and weak all over

## 2015-05-14 ENCOUNTER — Telehealth: Payer: Self-pay

## 2015-05-14 ENCOUNTER — Encounter (HOSPITAL_BASED_OUTPATIENT_CLINIC_OR_DEPARTMENT_OTHER): Payer: Self-pay | Admitting: Emergency Medicine

## 2015-05-14 LAB — ACETAMINOPHEN LEVEL: Acetaminophen (Tylenol), Serum: <10 ug/mL — ABNORMAL LOW (ref 10–30)

## 2015-05-14 NOTE — Telephone Encounter (Signed)
DO you want me to tell her to come in?

## 2015-05-14 NOTE — Telephone Encounter (Signed)
Call patient and tell her she needs to come to the office for evaluation. I feel we could better handle her medical problems during an office visit rather than over the phone. She can come in tomorrow and see any of the providers at 102.

## 2015-05-14 NOTE — Telephone Encounter (Signed)
Pt is checking on status of her message regarding her ER visit  Best number [phone]

## 2015-05-14 NOTE — Telephone Encounter (Signed)
Patient request for Dr. Cleta Alberts to give her a call concerning her visit at the ER. [phone]

## 2015-05-15 NOTE — Telephone Encounter (Signed)
Left message advising pt to RTC.

## 2015-05-15 NOTE — Telephone Encounter (Signed)
LM she has to come in,. See previous note from Dr. Cleta Alberts.

## 2015-05-16 ENCOUNTER — Emergency Department (HOSPITAL_COMMUNITY)
Admission: EM | Admit: 2015-05-16 | Discharge: 2015-05-16 | Disposition: A | Discharge status: Home / Self Care | Payer: Managed Care, Other (non HMO) | Attending: Emergency Medicine | Admitting: Emergency Medicine

## 2015-05-16 ENCOUNTER — Encounter (HOSPITAL_COMMUNITY): Payer: Self-pay | Admitting: Emergency Medicine

## 2015-05-16 DIAGNOSIS — R197 Diarrhea, unspecified: Secondary | ICD-10-CM | POA: Insufficient documentation

## 2015-05-16 DIAGNOSIS — R1084 Generalized abdominal pain: Secondary | ICD-10-CM | POA: Insufficient documentation

## 2015-05-16 DIAGNOSIS — F17210 Nicotine dependence, cigarettes, uncomplicated: Secondary | ICD-10-CM | POA: Insufficient documentation

## 2015-05-16 DIAGNOSIS — R112 Nausea with vomiting, unspecified: Secondary | ICD-10-CM | POA: Insufficient documentation

## 2015-05-16 MED ORDER — ONDANSETRON HCL 4 MG/2ML IJ SOLN: 4.0000 mg | Freq: Once | INTRAMUSCULAR | Status: DC | PRN

## 2015-05-16 NOTE — ED Notes (Signed)
Pt c/o nausea, emesis, diarrhea and diffuse abdominal pain, unsure of onset, was seen on 05/13/15, given zofran, which provided some relief.

## 2015-05-16 NOTE — ED Notes (Signed)
Patient left without being seen. States she wants to go home and take her prescribed zofran. This RN explained to patient that room was available and this RN ordered zofran already. Pt still left.

## 2015-05-18 ENCOUNTER — Emergency Department (HOSPITAL_COMMUNITY): Payer: Managed Care, Other (non HMO)

## 2015-05-18 ENCOUNTER — Inpatient Hospital Stay (HOSPITAL_COMMUNITY): Payer: Managed Care, Other (non HMO)

## 2015-05-18 ENCOUNTER — Inpatient Hospital Stay (HOSPITAL_COMMUNITY)
Admission: EM | Admit: 2015-05-18 | Discharge: 2015-06-05 | DRG: 871 | Disposition: A | Discharge status: Home Health | Payer: Managed Care, Other (non HMO) | Attending: Internal Medicine | Admitting: Internal Medicine

## 2015-05-18 ENCOUNTER — Encounter (HOSPITAL_COMMUNITY): Payer: Self-pay | Admitting: Emergency Medicine

## 2015-05-18 DIAGNOSIS — K7011 Alcoholic hepatitis with ascites: Secondary | ICD-10-CM

## 2015-05-18 DIAGNOSIS — B961 Klebsiella pneumoniae [K. pneumoniae] as the cause of diseases classified elsewhere: Secondary | ICD-10-CM | POA: Diagnosis present

## 2015-05-18 DIAGNOSIS — E876 Hypokalemia: Secondary | ICD-10-CM | POA: Diagnosis present

## 2015-05-18 DIAGNOSIS — F419 Anxiety disorder, unspecified: Secondary | ICD-10-CM | POA: Diagnosis present

## 2015-05-18 DIAGNOSIS — F10231 Alcohol dependence with withdrawal delirium: Secondary | ICD-10-CM | POA: Diagnosis not present

## 2015-05-18 DIAGNOSIS — R109 Unspecified abdominal pain: Secondary | ICD-10-CM | POA: Diagnosis not present

## 2015-05-18 DIAGNOSIS — F10239 Alcohol dependence with withdrawal, unspecified: Secondary | ICD-10-CM

## 2015-05-18 DIAGNOSIS — F909 Attention-deficit hyperactivity disorder, unspecified type: Secondary | ICD-10-CM | POA: Diagnosis present

## 2015-05-18 DIAGNOSIS — Z9119 Patient's noncompliance with other medical treatment and regimen: Secondary | ICD-10-CM

## 2015-05-18 DIAGNOSIS — D539 Nutritional anemia, unspecified: Secondary | ICD-10-CM

## 2015-05-18 DIAGNOSIS — R6520 Severe sepsis without septic shock: Secondary | ICD-10-CM | POA: Diagnosis present

## 2015-05-18 DIAGNOSIS — G35 Multiple sclerosis: Secondary | ICD-10-CM | POA: Diagnosis present

## 2015-05-18 DIAGNOSIS — Y903 Blood alcohol level of 60-79 mg/100 ml: Secondary | ICD-10-CM | POA: Diagnosis present

## 2015-05-18 DIAGNOSIS — D696 Thrombocytopenia, unspecified: Secondary | ICD-10-CM | POA: Diagnosis present

## 2015-05-18 DIAGNOSIS — E872 Acidosis, unspecified: Secondary | ICD-10-CM

## 2015-05-18 DIAGNOSIS — R05 Cough: Secondary | ICD-10-CM

## 2015-05-18 DIAGNOSIS — R110 Nausea: Secondary | ICD-10-CM

## 2015-05-18 DIAGNOSIS — E8770 Fluid overload, unspecified: Secondary | ICD-10-CM | POA: Diagnosis present

## 2015-05-18 DIAGNOSIS — R1084 Generalized abdominal pain: Secondary | ICD-10-CM

## 2015-05-18 DIAGNOSIS — N390 Urinary tract infection, site not specified: Secondary | ICD-10-CM

## 2015-05-18 DIAGNOSIS — K7290 Hepatic failure, unspecified without coma: Secondary | ICD-10-CM | POA: Diagnosis not present

## 2015-05-18 DIAGNOSIS — F319 Bipolar disorder, unspecified: Secondary | ICD-10-CM

## 2015-05-18 DIAGNOSIS — K7031 Alcoholic cirrhosis of liver with ascites: Secondary | ICD-10-CM | POA: Diagnosis not present

## 2015-05-18 DIAGNOSIS — K7030 Alcoholic cirrhosis of liver without ascites: Secondary | ICD-10-CM

## 2015-05-18 DIAGNOSIS — F10230 Alcohol dependence with withdrawal, uncomplicated: Secondary | ICD-10-CM | POA: Diagnosis not present

## 2015-05-18 DIAGNOSIS — K652 Spontaneous bacterial peritonitis: Secondary | ICD-10-CM | POA: Diagnosis present

## 2015-05-18 DIAGNOSIS — F10930 Alcohol use, unspecified with withdrawal, uncomplicated: Secondary | ICD-10-CM

## 2015-05-18 DIAGNOSIS — G43909 Migraine, unspecified, not intractable, without status migrainosus: Secondary | ICD-10-CM | POA: Diagnosis present

## 2015-05-18 DIAGNOSIS — T501X5A Adverse effect of loop [high-ceiling] diuretics, initial encounter: Secondary | ICD-10-CM | POA: Diagnosis present

## 2015-05-18 DIAGNOSIS — Z720 Tobacco use: Secondary | ICD-10-CM | POA: Diagnosis present

## 2015-05-18 DIAGNOSIS — R059 Cough, unspecified: Secondary | ICD-10-CM

## 2015-05-18 DIAGNOSIS — L308 Other specified dermatitis: Secondary | ICD-10-CM | POA: Diagnosis present

## 2015-05-18 DIAGNOSIS — E871 Hypo-osmolality and hyponatremia: Secondary | ICD-10-CM | POA: Diagnosis present

## 2015-05-18 DIAGNOSIS — A419 Sepsis, unspecified organism: Secondary | ICD-10-CM | POA: Diagnosis present

## 2015-05-18 DIAGNOSIS — K7200 Acute and subacute hepatic failure without coma: Secondary | ICD-10-CM | POA: Diagnosis present

## 2015-05-18 DIAGNOSIS — F17210 Nicotine dependence, cigarettes, uncomplicated: Secondary | ICD-10-CM | POA: Diagnosis present

## 2015-05-18 DIAGNOSIS — R112 Nausea with vomiting, unspecified: Secondary | ICD-10-CM | POA: Diagnosis present

## 2015-05-18 DIAGNOSIS — K7460 Unspecified cirrhosis of liver: Secondary | ICD-10-CM | POA: Diagnosis present

## 2015-05-18 DIAGNOSIS — F3163 Bipolar disorder, current episode mixed, severe, without psychotic features: Secondary | ICD-10-CM | POA: Diagnosis not present

## 2015-05-18 DIAGNOSIS — Z6836 Body mass index (BMI) 36.0-36.9, adult: Secondary | ICD-10-CM

## 2015-05-18 DIAGNOSIS — F3160 Bipolar disorder, current episode mixed, unspecified: Secondary | ICD-10-CM | POA: Diagnosis not present

## 2015-05-18 DIAGNOSIS — B372 Candidiasis of skin and nail: Secondary | ICD-10-CM | POA: Diagnosis present

## 2015-05-18 DIAGNOSIS — R32 Unspecified urinary incontinence: Secondary | ICD-10-CM | POA: Diagnosis present

## 2015-05-18 DIAGNOSIS — B9620 Unspecified Escherichia coli [E. coli] as the cause of diseases classified elsewhere: Secondary | ICD-10-CM | POA: Diagnosis present

## 2015-05-18 DIAGNOSIS — K7682 Hepatic encephalopathy: Secondary | ICD-10-CM | POA: Diagnosis present

## 2015-05-18 DIAGNOSIS — Z8249 Family history of ischemic heart disease and other diseases of the circulatory system: Secondary | ICD-10-CM

## 2015-05-18 DIAGNOSIS — F10939 Alcohol use, unspecified with withdrawal, unspecified: Secondary | ICD-10-CM | POA: Diagnosis present

## 2015-05-18 DIAGNOSIS — E43 Unspecified severe protein-calorie malnutrition: Secondary | ICD-10-CM

## 2015-05-18 DIAGNOSIS — R000 Tachycardia, unspecified: Secondary | ICD-10-CM | POA: Diagnosis present

## 2015-05-18 LAB — CBC WITH DIFFERENTIAL/PLATELET
BASOS ABS: 0 10*3/uL (ref 0.0–0.1)
BASOS PCT: 0 %
BASOS PCT: 0 %
Basophils Absolute: 0 10*3/uL (ref 0.0–0.1)
EOS ABS: 0.1 10*3/uL (ref 0.0–0.7)
EOS PCT: 2 %
Eosinophils Absolute: 0.2 10*3/uL (ref 0.0–0.7)
Eosinophils Relative: 1 %
HCT: 39.5 % (ref 36.0–46.0)
HEMATOCRIT: 35.1 % — AB (ref 36.0–46.0)
HEMOGLOBIN: 14 g/dL (ref 12.0–15.0)
Hemoglobin: 12.2 g/dL (ref 12.0–15.0)
LYMPHS PCT: 9 %
Lymphocytes Relative: 11 %
Lymphs Abs: 1.4 10*3/uL (ref 0.7–4.0)
Lymphs Abs: 1.1 10*3/uL (ref 0.7–4.0)
MCH: 35.8 pg — ABNORMAL HIGH (ref 26.0–34.0)
MCH: 35.3 pg — ABNORMAL HIGH (ref 26.0–34.0)
MCHC: 35.4 g/dL (ref 30.0–36.0)
MCHC: 34.8 g/dL (ref 30.0–36.0)
MCV: 101 fL — ABNORMAL HIGH (ref 78.0–100.0)
MCV: 101.4 fL — AB (ref 78.0–100.0)
MONO ABS: 1.3 10*3/uL — AB (ref 0.1–1.0)
MONOS PCT: 11 %
MONOS PCT: 11 %
Monocytes Absolute: 1.4 10*3/uL — ABNORMAL HIGH (ref 0.1–1.0)
NEUTROS PCT: 77 %
Neutro Abs: 10.1 10*3/uL — ABNORMAL HIGH (ref 1.7–7.7)
Neutro Abs: 9.7 10*3/uL — ABNORMAL HIGH (ref 1.7–7.7)
Neutrophils Relative %: 78 %
PLATELETS: 138 10*3/uL — AB (ref 150–400)
PLATELETS: 134 10*3/uL — AB (ref 150–400)
RBC: 3.91 MIL/uL (ref 3.87–5.11)
RBC: 3.46 MIL/uL — ABNORMAL LOW (ref 3.87–5.11)
RDW: 19.8 % — ABNORMAL HIGH (ref 11.5–15.5)
RDW: 19.7 % — AB (ref 11.5–15.5)
WBC: 13 10*3/uL — AB (ref 4.0–10.5)
WBC: 12.4 10*3/uL — ABNORMAL HIGH (ref 4.0–10.5)

## 2015-05-18 LAB — I-STAT CG4 LACTIC ACID, ED
LACTIC ACID, VENOUS: 4.71 mmol/L — AB (ref 0.5–2.0)
LACTIC ACID, VENOUS: 5.43 mmol/L — AB (ref 0.5–2.0)

## 2015-05-18 LAB — COMPREHENSIVE METABOLIC PANEL
ALK PHOS: 359 U/L — AB (ref 38–126)
ALT: 126 U/L — AB (ref 14–54)
ALT: 116 U/L — ABNORMAL HIGH (ref 14–54)
ANION GAP: 12 (ref 5–15)
AST: 174 U/L — ABNORMAL HIGH (ref 15–41)
AST: 154 U/L — AB (ref 15–41)
Albumin: 2.5 g/dL — ABNORMAL LOW (ref 3.5–5.0)
Albumin: 2.2 g/dL — ABNORMAL LOW (ref 3.5–5.0)
Alkaline Phosphatase: 336 U/L — ABNORMAL HIGH (ref 38–126)
Anion gap: 14 (ref 5–15)
BILIRUBIN TOTAL: 8.7 mg/dL — AB (ref 0.3–1.2)
BILIRUBIN TOTAL: 8.8 mg/dL — AB (ref 0.3–1.2)
CALCIUM: 7.9 mg/dL — AB (ref 8.9–10.3)
CHLORIDE: 95 mmol/L — AB (ref 101–111)
CO2: 26 mmol/L (ref 22–32)
CO2: 23 mmol/L (ref 22–32)
Calcium: 7.6 mg/dL — ABNORMAL LOW (ref 8.9–10.3)
Chloride: 90 mmol/L — ABNORMAL LOW (ref 101–111)
Creatinine, Ser: <0.3 mg/dL — ABNORMAL LOW (ref 0.44–1.00)
Glucose, Bld: 106 mg/dL — ABNORMAL HIGH (ref 65–99)
Glucose, Bld: 153 mg/dL — ABNORMAL HIGH (ref 65–99)
POTASSIUM: 3 mmol/L — AB (ref 3.5–5.1)
Potassium: 4.1 mmol/L (ref 3.5–5.1)
Sodium: 130 mmol/L — ABNORMAL LOW (ref 135–145)
Sodium: 130 mmol/L — ABNORMAL LOW (ref 135–145)
TOTAL PROTEIN: 5.7 g/dL — AB (ref 6.5–8.1)
Total Protein: 6.1 g/dL — ABNORMAL LOW (ref 6.5–8.1)

## 2015-05-18 LAB — GLUCOSE, PERITONEAL FLUID: Glucose, Peritoneal Fluid: 147 mg/dL

## 2015-05-18 LAB — URINALYSIS, ROUTINE W REFLEX MICROSCOPIC
Glucose, UA: NEGATIVE mg/dL
Ketones, ur: 40 mg/dL — AB
NITRITE: POSITIVE — AB
PROTEIN: 30 mg/dL — AB
SPECIFIC GRAVITY, URINE: 1.021 (ref 1.005–1.030)
pH: 6 (ref 5.0–8.0)

## 2015-05-18 LAB — URINE MICROSCOPIC-ADD ON

## 2015-05-18 LAB — PROTEIN, BODY FLUID

## 2015-05-18 LAB — ALBUMIN, FLUID (OTHER): Albumin, Fluid: <1 g/dL

## 2015-05-18 LAB — MRSA PCR SCREENING: MRSA BY PCR: POSITIVE — AB

## 2015-05-18 LAB — ETHANOL: ALCOHOL ETHYL (B): 73 mg/dL — AB (ref <5)

## 2015-05-18 LAB — LACTATE DEHYDROGENASE, PLEURAL OR PERITONEAL FLUID: LD FL: 102 U/L — AB (ref 3–23)

## 2015-05-18 LAB — PROTIME-INR
INR: 1.32 (ref 0.00–1.49)
Prothrombin Time: 16 seconds — ABNORMAL HIGH (ref 11.6–15.2)

## 2015-05-18 LAB — APTT: aPTT: 20 seconds — ABNORMAL LOW (ref 24–37)

## 2015-05-18 LAB — PROCALCITONIN: PROCALCITONIN: 0.65 ng/mL

## 2015-05-18 LAB — LACTIC ACID, PLASMA
LACTIC ACID, VENOUS: 3.4 mmol/L — AB (ref 0.5–2.0)
LACTIC ACID, VENOUS: 2.9 mmol/L — AB (ref 0.5–2.0)

## 2015-05-18 MED ORDER — FLUOXETINE HCL 20 MG PO CAPS
20.0000 mg | ORAL_CAPSULE | Freq: Every day | ORAL | Status: DC
  Administered 2015-05-19 – 2015-05-24 (×5): 20 mg via ORAL
  Filled 2015-05-18 (×5): qty 1

## 2015-05-18 MED ORDER — SODIUM CHLORIDE 0.9 % IV BOLUS (SEPSIS)
1000.0000 mL | Freq: Once | INTRAVENOUS | Status: AC
  Administered 2015-05-18: 1000 mL via INTRAVENOUS

## 2015-05-18 MED ORDER — ACETAMINOPHEN 325 MG PO TABS
650.0000 mg | ORAL_TABLET | Freq: Four times a day (QID) | ORAL | Status: DC | PRN
  Administered 2015-05-19: 650 mg via ORAL
  Filled 2015-05-18: qty 2

## 2015-05-18 MED ORDER — IOPAMIDOL (ISOVUE-300) INJECTION 61%
100.0000 mL | Freq: Once | INTRAVENOUS | Status: AC | PRN
  Administered 2015-05-18: 100 mL via INTRAVENOUS

## 2015-05-18 MED ORDER — MUPIROCIN 2 % EX OINT
1.0000 "application " | TOPICAL_OINTMENT | Freq: Two times a day (BID) | CUTANEOUS | Status: DC
  Administered 2015-05-18 – 2015-05-23 (×9): 1 via NASAL
  Filled 2015-05-18: qty 22

## 2015-05-18 MED ORDER — BENZTROPINE MESYLATE 0.5 MG PO TABS
0.5000 mg | ORAL_TABLET | Freq: Every day | ORAL | Status: DC
  Administered 2015-05-18 – 2015-05-23 (×3): 0.5 mg via ORAL
  Filled 2015-05-18 (×4): qty 1

## 2015-05-18 MED ORDER — VITAMIN B-1 100 MG PO TABS
100.0000 mg | ORAL_TABLET | Freq: Every day | ORAL | Status: DC
  Administered 2015-05-18 – 2015-06-05 (×18): 100 mg via ORAL
  Filled 2015-05-18 (×18): qty 1

## 2015-05-18 MED ORDER — MORPHINE SULFATE (PF) 2 MG/ML IV SOLN: 2.0000 mg | INTRAVENOUS | Status: DC | PRN

## 2015-05-18 MED ORDER — CHLORHEXIDINE GLUCONATE CLOTH 2 % EX PADS
6.0000 | MEDICATED_PAD | Freq: Every day | CUTANEOUS | Status: AC
  Administered 2015-05-19 – 2015-05-23 (×5): 6 via TOPICAL

## 2015-05-18 MED ORDER — RISPERIDONE 0.5 MG PO TABS
1.5000 mg | ORAL_TABLET | Freq: Every day | ORAL | Status: DC
  Administered 2015-05-18 – 2015-05-23 (×3): 1.5 mg via ORAL
  Filled 2015-05-18 (×7): qty 1

## 2015-05-18 MED ORDER — HYDROXYZINE HCL 25 MG PO TABS: 25.0000 mg | ORAL_TABLET | Freq: Three times a day (TID) | ORAL | Status: DC | PRN

## 2015-05-18 MED ORDER — ONDANSETRON HCL 4 MG PO TABS
4.0000 mg | ORAL_TABLET | Freq: Four times a day (QID) | ORAL | Status: DC | PRN
  Administered 2015-05-30 (×2): 4 mg via ORAL
  Filled 2015-05-18 (×2): qty 1

## 2015-05-18 MED ORDER — ALBUTEROL SULFATE (2.5 MG/3ML) 0.083% IN NEBU
3.0000 mL | INHALATION_SOLUTION | Freq: Four times a day (QID) | RESPIRATORY_TRACT | Status: DC | PRN
  Filled 2015-05-18: qty 3

## 2015-05-18 MED ORDER — LORAZEPAM 2 MG/ML IJ SOLN: 1.0000 mg | Freq: Once | INTRAMUSCULAR | Status: DC

## 2015-05-18 MED ORDER — MOMETASONE FURO-FORMOTEROL FUM 200-5 MCG/ACT IN AERO
2.0000 | INHALATION_SPRAY | Freq: Two times a day (BID) | RESPIRATORY_TRACT | Status: DC
  Administered 2015-05-19 – 2015-06-05 (×29): 2 via RESPIRATORY_TRACT
  Filled 2015-05-18 (×3): qty 8.8

## 2015-05-18 MED ORDER — MAGNESIUM SULFATE 2 GM/50ML IV SOLN
2.0000 g | Freq: Once | INTRAVENOUS | Status: AC
  Administered 2015-05-18: 2 g via INTRAVENOUS
  Filled 2015-05-18: qty 50

## 2015-05-18 MED ORDER — MODAFINIL 200 MG PO TABS
200.0000 mg | ORAL_TABLET | Freq: Every day | ORAL | Status: DC
Start: 2015-05-18 — End: 2015-05-20
  Administered 2015-05-19 – 2015-05-20 (×2): 200 mg via ORAL
  Filled 2015-05-18 (×4): qty 1

## 2015-05-18 MED ORDER — M.V.I. ADULT IV INJ
Freq: Once | INTRAVENOUS | Status: AC
  Administered 2015-05-18: 11:00:00 via INTRAVENOUS
  Filled 2015-05-18: qty 10

## 2015-05-18 MED ORDER — PANTOPRAZOLE SODIUM 40 MG PO TBEC
40.0000 mg | DELAYED_RELEASE_TABLET | Freq: Two times a day (BID) | ORAL | Status: DC
  Administered 2015-05-18 – 2015-05-26 (×11): 40 mg via ORAL
  Filled 2015-05-18 (×14): qty 1

## 2015-05-18 MED ORDER — FOLIC ACID 1 MG PO TABS
1.0000 mg | ORAL_TABLET | Freq: Every day | ORAL | Status: DC
Start: 2015-05-18 — End: 2015-05-31
  Administered 2015-05-18 – 2015-05-31 (×13): 1 mg via ORAL
  Filled 2015-05-18 (×13): qty 1

## 2015-05-18 MED ORDER — POTASSIUM CHLORIDE 10 MEQ/100ML IV SOLN
10.0000 meq | INTRAVENOUS | Status: DC
  Filled 2015-05-18: qty 100

## 2015-05-18 MED ORDER — ONDANSETRON HCL 4 MG/2ML IJ SOLN
4.0000 mg | Freq: Once | INTRAMUSCULAR | Status: AC
  Administered 2015-05-18: 4 mg via INTRAVENOUS
  Filled 2015-05-18: qty 2

## 2015-05-18 MED ORDER — CYCLOBENZAPRINE HCL 5 MG PO TABS
5.0000 mg | ORAL_TABLET | Freq: Three times a day (TID) | ORAL | Status: DC | PRN
  Administered 2015-05-19: 10 mg via ORAL
  Filled 2015-05-18: qty 2

## 2015-05-18 MED ORDER — LORAZEPAM 2 MG/ML IJ SOLN
1.0000 mg | Freq: Once | INTRAMUSCULAR | Status: AC
  Administered 2015-05-18: 1 mg via INTRAVENOUS
  Filled 2015-05-18: qty 1

## 2015-05-18 MED ORDER — TOPIRAMATE 100 MG PO TABS
100.0000 mg | ORAL_TABLET | Freq: Every day | ORAL | Status: DC
  Administered 2015-05-18 – 2015-05-23 (×5): 100 mg via ORAL
  Filled 2015-05-18 (×7): qty 1

## 2015-05-18 MED ORDER — DEXTROSE 5 % IV SOLN: 1.0000 g | Freq: Once | INTRAVENOUS | Status: DC

## 2015-05-18 MED ORDER — LIDOCAINE 5 % EX PTCH
1.0000 | MEDICATED_PATCH | Freq: Every day | CUTANEOUS | Status: DC | PRN
Start: 2015-05-18 — End: 2015-05-24
  Filled 2015-05-18: qty 1

## 2015-05-18 MED ORDER — SODIUM CHLORIDE 0.9% FLUSH
3.0000 mL | Freq: Two times a day (BID) | INTRAVENOUS | Status: DC
  Administered 2015-05-18 – 2015-05-20 (×3): 3 mL via INTRAVENOUS

## 2015-05-18 MED ORDER — IPRATROPIUM-ALBUTEROL 0.5-2.5 (3) MG/3ML IN SOLN
3.0000 mL | Freq: Once | RESPIRATORY_TRACT | Status: AC
  Administered 2015-05-18: 3 mL via RESPIRATORY_TRACT
  Filled 2015-05-18: qty 3

## 2015-05-18 MED ORDER — AMANTADINE HCL 100 MG PO CAPS
100.0000 mg | ORAL_CAPSULE | Freq: Two times a day (BID) | ORAL | Status: DC
  Administered 2015-05-18 – 2015-06-05 (×31): 100 mg via ORAL
  Filled 2015-05-18 (×39): qty 1

## 2015-05-18 MED ORDER — SUCRALFATE 1 GM/10ML PO SUSP
1.0000 g | Freq: Three times a day (TID) | ORAL | Status: DC
  Administered 2015-05-18 – 2015-05-22 (×10): 1 g via ORAL
  Administered 2015-05-22: 0.5 g via ORAL
  Administered 2015-05-23 (×3): 1 g via ORAL
  Filled 2015-05-18 (×15): qty 10

## 2015-05-18 MED ORDER — ENOXAPARIN SODIUM 40 MG/0.4ML ~~LOC~~ SOLN
40.0000 mg | SUBCUTANEOUS | Status: DC
  Administered 2015-05-19 – 2015-06-04 (×18): 40 mg via SUBCUTANEOUS
  Filled 2015-05-18 (×19): qty 0.4

## 2015-05-18 MED ORDER — PIPERACILLIN-TAZOBACTAM 3.375 G IVPB
3.3750 g | Freq: Three times a day (TID) | INTRAVENOUS | Status: DC
  Administered 2015-05-18 – 2015-05-21 (×8): 3.375 g via INTRAVENOUS
  Filled 2015-05-18 (×8): qty 50

## 2015-05-18 MED ORDER — PIPERACILLIN-TAZOBACTAM 3.375 G IVPB
3.3750 g | Freq: Once | INTRAVENOUS | Status: AC
  Administered 2015-05-18: 3.375 g via INTRAVENOUS
  Filled 2015-05-18: qty 50

## 2015-05-18 MED ORDER — NICOTINE 21 MG/24HR TD PT24
21.0000 mg | MEDICATED_PATCH | Freq: Every day | TRANSDERMAL | Status: DC
  Administered 2015-05-18 – 2015-05-24 (×6): 21 mg via TRANSDERMAL
  Filled 2015-05-18 (×6): qty 1

## 2015-05-18 MED ORDER — FENTANYL CITRATE (PF) 100 MCG/2ML IJ SOLN
50.0000 ug | Freq: Once | INTRAMUSCULAR | Status: AC
  Administered 2015-05-18: 50 ug via INTRAVENOUS
  Filled 2015-05-18: qty 2

## 2015-05-18 MED ORDER — PIPERACILLIN-TAZOBACTAM 3.375 G IVPB 30 MIN: 3.3750 g | Freq: Once | INTRAVENOUS | Status: DC

## 2015-05-18 MED ORDER — LIDOCAINE-EPINEPHRINE 1 %-1:100000 IJ SOLN
10.0000 mL | Freq: Once | INTRAMUSCULAR | Status: AC
  Administered 2015-05-18: 1 mL
  Filled 2015-05-18: qty 1

## 2015-05-18 MED ORDER — LORAZEPAM 2 MG/ML IJ SOLN
0.0000 mg | Freq: Two times a day (BID) | INTRAMUSCULAR | Status: DC
  Administered 2015-05-20: 2 mg via INTRAVENOUS
  Filled 2015-05-18: qty 1

## 2015-05-18 MED ORDER — LORAZEPAM 2 MG/ML IJ SOLN
0.0000 mg | Freq: Four times a day (QID) | INTRAMUSCULAR | Status: AC
  Administered 2015-05-18: 2 mg via INTRAVENOUS
  Administered 2015-05-18 (×2): 1 mg via INTRAVENOUS
  Administered 2015-05-19 (×3): 2 mg via INTRAVENOUS
  Filled 2015-05-18 (×5): qty 1

## 2015-05-18 MED ORDER — ACETAMINOPHEN 650 MG RE SUPP: 650.0000 mg | Freq: Four times a day (QID) | RECTAL | Status: DC | PRN

## 2015-05-18 MED ORDER — LORAZEPAM 2 MG/ML IJ SOLN
2.0000 mg | INTRAMUSCULAR | Status: DC | PRN
  Administered 2015-05-19: 1 mg via INTRAVENOUS
  Administered 2015-05-20 (×2): 3 mg via INTRAVENOUS
  Filled 2015-05-18: qty 1
  Filled 2015-05-18: qty 2
  Filled 2015-05-18: qty 1
  Filled 2015-05-18: qty 2

## 2015-05-18 MED ORDER — POTASSIUM CHLORIDE 10 MEQ/100ML IV SOLN
10.0000 meq | Freq: Once | INTRAVENOUS | Status: AC
  Administered 2015-05-18: 10 meq via INTRAVENOUS

## 2015-05-18 MED ORDER — SODIUM CHLORIDE 0.9 % IV SOLN: 250.0000 mL | INTRAVENOUS | Status: DC | PRN

## 2015-05-18 MED ORDER — SODIUM CHLORIDE 0.9 % IV SOLN
1.0000 g | Freq: Once | INTRAVENOUS | Status: AC
  Administered 2015-05-18: 1 g via INTRAVENOUS
  Filled 2015-05-18: qty 10

## 2015-05-18 MED ORDER — THIAMINE HCL 100 MG/ML IJ SOLN
100.0000 mg | Freq: Once | INTRAMUSCULAR | Status: AC
  Administered 2015-05-18: 100 mg via INTRAVENOUS
  Filled 2015-05-18: qty 2

## 2015-05-18 MED ORDER — SODIUM CHLORIDE 0.9% FLUSH: 3.0000 mL | INTRAVENOUS | Status: DC | PRN

## 2015-05-18 MED ORDER — ONDANSETRON HCL 4 MG/2ML IJ SOLN
4.0000 mg | Freq: Four times a day (QID) | INTRAMUSCULAR | Status: DC | PRN
  Administered 2015-05-23 – 2015-05-27 (×3): 4 mg via INTRAVENOUS
  Filled 2015-05-18 (×3): qty 2

## 2015-05-18 MED ORDER — ADULT MULTIVITAMIN W/MINERALS CH
1.0000 | ORAL_TABLET | Freq: Every day | ORAL | Status: DC
  Administered 2015-05-18 – 2015-05-24 (×6): 1 via ORAL
  Filled 2015-05-18 (×6): qty 1

## 2015-05-18 NOTE — H&P (Signed)
[facility] History and Physical  Anne Black YHO:[id number] DOB: 1960-03-28 DOA: 05/18/2015  Referring physician: Dr. Frederick Peers, EDP PCP: No primary care provider on file.  Specialists:   Chief Complaint: Abdominal pain, nausea and vomiting diarrhea, alcohol detox  HPI: Anne Black is a 55 y.o. female  With a history of multiple sclerosis, bipolar disorder, depression and anxiety, presents emergency department with complaints of abdominal pain, nausea and vomiting, diarrhea. Patient has been to the emergency department twice this past week for similar presentation. She was seen on 05/13/2015 had ultrasound of the abdomen at that time refused admission. Patient then presented again 2 days ago for similar symptoms and left without being seen. She returned to the emergency department today with similar complaints and states she's had approximately 3 episodes of diarrhea with nausea and vomiting in the last day. She reports feeling "shaky". She's also complained of some burning abdominal pain which is been constant, more so in the upper right quadrant. The patient denied any urinary symptoms. Patient also states that she would like to undergo alcohol detox. She has been in contact with [facility], and stated that they would pick her up. She cannot quantify the amount of alcohol that she consumes it states that things have been very difficult for her recently. She denies any suicidal or homicidal ideations. In the emergency department, patient was found to have tachycardia with elevated lactic acid and leukocytosis. Pearson centesis was conducted at bedside. CT of the abdomen showed hepatic cirrhosis. [facility] called for admission.  Review of Systems:  Constitutional: Denies fever, chills, diaphoresis, appetite change and fatigue.  HEENT: Denies photophobia, eye pain, redness, hearing loss, ear pain, congestion, sore throat, rhinorrhea, sneezing, mouth sores, trouble swallowing, neck pain,  neck stiffness and tinnitus.   Respiratory: Denies SOB, DOE, cough, chest tightness,  and wheezing.   Cardiovascular: Denies chest pain, palpitations and leg swelling.  Gastrointestinal: Complains of abdominal pain, nausea, vomiting, diarrhea Genitourinary: Denies dysuria, urgency, frequency, hematuria, flank pain and difficulty urinating.  Musculoskeletal: Denies myalgias, back pain, joint swelling, arthralgias and gait problem.  Skin: Denies pallor, rash and wound.  Neurological: Complains of feeling "shaky"  Hematological: Denies adenopathy. Easy bruising, personal or family bleeding history  Psychiatric/Behavioral: Denies suicidal ideation, mood changes, confusion, nervousness, sleep disturbance and agitation  Past Medical History  Diagnosis Date  . Multiple sclerosis (HCC)   . Arthritis   . Anxiety   . Bipolar disorder (HCC)   . ADHD (attention deficit hyperactivity disorder)   . Migraine   . Osteoporosis    Past Surgical History  Procedure Laterality Date  . Cesarean section  [id number]  . Myringotomy with tube placement Bilateral   . Tonsillectomy     Social History:  reports that she has been smoking Cigarettes.  She has a 15 pack-year smoking history. She has never used smokeless tobacco. She reports that she drinks about 4.8 oz of alcohol per week. She reports that she does not use illicit drugs.  No Known Allergies  Family History  Problem Relation Age of Onset  . Heart disease Mother   . Heart disease Father   . Hypertension Father     Prior to Admission medications   Medication Sig Start Date End Date Taking? Authorizing Provider  albuterol (PROVENTIL HFA;VENTOLIN HFA) 108 (90 BASE) MCG/ACT inhaler Inhale 2 puffs into the lungs every 6 (six) hours as needed for wheezing. 02/28/14  Yes Tiffany L Reed, DO  Alum & Mag Hydroxide-Simeth (GI COCKTAIL)  SUSP suspension Take 30 mLs by mouth 2 (two) times daily as needed for indigestion. Shake well.   Yes Historical  Provider, MD  amantadine (SYMMETREL) 100 MG capsule Take 1 capsule (100 mg total) by mouth 2 (two) times daily. Reported on 02/20/2015 02/21/15  Yes Thermon Leyland, NP  benztropine (COGENTIN) 0.5 MG tablet Take 1 tablet (0.5 mg total) by mouth at bedtime. 02/21/15  Yes Thermon Leyland, NP  clonazePAM (KLONOPIN) 1 MG tablet Take 1 tablet (1 mg total) by mouth 2 (two) times daily as needed for anxiety. 04/06/15  Yes Chelle Jeffery, PA-C  cyclobenzaprine (FLEXERIL) 10 MG tablet Take 0.5-1 tablets (5-10 mg total) by mouth 3 (three) times daily as needed for muscle spasms. 04/06/15  Yes Chelle Jeffery, PA-C  diphenhydrAMINE (BENADRYL) 25 MG tablet Take 50 mg by mouth at bedtime as needed for sleep.    Yes Historical Provider, MD  FLUoxetine (PROZAC) 20 MG capsule Take 1 capsule (20 mg total) by mouth daily. Reported on 02/20/2015 04/15/15  Yes Chelle Jeffery, PA-C  Fluticasone-Salmeterol (ADVAIR) 250-50 MCG/DOSE AEPB Inhale 1 puff into the lungs 2 (two) times daily.   Yes Historical Provider, MD  hydrochlorothiazide (HYDRODIURIL) 12.5 MG tablet Take 1 tablet (12.5 mg total) by mouth daily. Reported on 02/20/2015 02/21/15  Yes Thermon Leyland, NP  hydrOXYzine (ATARAX/VISTARIL) 25 MG tablet Take 1 tablet (25 mg total) by mouth 3 (three) times daily as needed (sleep). 02/21/15  Yes Thermon Leyland, NP  lidocaine (LIDODERM) 5 % Place 1 patch onto the skin daily as needed (for area underarm for redness/swelling/pain.). Reported on 04/06/2015   Yes Historical Provider, MD  meloxicam (MOBIC) 7.5 MG tablet Take 7.5 mg by mouth daily.  03/17/15  Yes Historical Provider, MD  modafinil (PROVIGIL) 200 MG tablet Take 1 tablet (200 mg total) by mouth daily. Reported on 02/20/2015 02/21/15  Yes Thermon Leyland, NP  nicotine (NICODERM CQ - DOSED IN MG/24 HOURS) 21 mg/24hr patch Place 1 patch (21 mg total) onto the skin daily. 02/21/15  Yes Thermon Leyland, NP  ondansetron (ZOFRAN) 4 MG tablet Take 1 tablet (4 mg total) by mouth every 8 (eight) hours  as needed for nausea or vomiting. 04/06/15  Yes Chelle Jeffery, PA-C  pantoprazole (PROTONIX) 40 MG tablet Take 1 tablet (40 mg total) by mouth 2 (two) times daily. 04/06/15  Yes Chelle Jeffery, PA-C  risperiDONE (RISPERDAL) 0.5 MG tablet Take 5 tablets (2.5 mg total) by mouth at bedtime. Patient taking differently: Take 1.5 mg by mouth at bedtime.  02/21/15  Yes Thermon Leyland, NP  rizatriptan (MAXALT) 10 MG tablet Take 10 mg by mouth daily as needed for migraine. Reported on 02/20/2015 09/05/14  Yes Historical Provider, MD  sucralfate (CARAFATE) 1 GM/10ML suspension Take 1 g by mouth 4 (four) times daily -  with meals and at bedtime.   Yes Historical Provider, MD  topiramate (TOPAMAX) 100 MG tablet Take 1 tablet (100 mg total) by mouth daily. 02/21/15  Yes Thermon Leyland, NP  chlorpheniramine-HYDROcodone [facility] PENNKINETIC ER) 10-8 MG/5ML SUER Take 5 mLs by mouth every 12 (twelve) hours as needed for cough. Patient not taking: Reported on 05/18/2015 04/06/15   Porfirio Oar, PA-C  Fluticasone-Salmeterol (ADVAIR) 100-50 MCG/DOSE AEPB Inhale 1 puff into the lungs 2 (two) times daily. Patient not taking: Reported on 05/13/2015 02/21/15   Thermon Leyland, NP  gabapentin (NEURONTIN) 100 MG capsule Take 1 capsule (100 mg total) by mouth 3 (three) times daily  as needed. Reported on 02/20/2015 Patient not taking: Reported on 05/13/2015 02/21/15   Thermon Leyland, NP  lisdexamfetamine (VYVANSE) 50 MG capsule Take 1 capsule (50 mg total) by mouth daily. Reported on 02/20/2015 Patient not taking: Reported on 04/06/2015 02/21/15   Thermon Leyland, NP   Physical Exam: Filed Vitals:   05/18/15 1245 05/18/15 1300  BP:  123/75  Pulse: 103   Temp:    Resp: 25 24     General: Well developed, well nourished, NAD, appears stated age  HEENT: NCAT, PERRLA, EOMI, Scleral icteurs, mucous membranes dry  Neck: Supple, no JVD, no masses  Cardiovascular: S1 S2 auscultated, no rubs, murmurs or gallops. Tachycardic  Respiratory:  Diminished breath sounds  Abdomen: Soft, diffusely TTP-worse in RUQ, distended, + bowel sounds. No peritonitis or guarding  Extremities: warm dry without cyanosis clubbing. 3+ edema LE B/L   Neuro: AAOx3, cranial nerves grossly intact. Strength 5/5 in patient's upper and lower extremities bilaterally. Mild tremor in upper extremities  Skin: Without rashes exudates or nodules  Psych: Anxious  Labs on Admission:  Basic Metabolic Panel:  Recent Labs Lab 05/13/15 0441 05/18/15 0915  NA 133* 130*  K 2.9* 4.1  CL 90* 90*  CO2 26 26  GLUCOSE 92 106*  BUN <5* <5*  CREATININE 0.45 <0.30*  CALCIUM 8.3* 7.9*   Liver Function Tests:  Recent Labs Lab 05/13/15 0441 05/18/15 0915  AST 182* 174*  ALT 94* 126*  ALKPHOS 327* 359*  BILITOT 7.9* 8.7*  PROT 6.1* 6.1*  ALBUMIN 2.6* 2.5*    Recent Labs Lab 05/13/15 0441  LIPASE 47   No results for input(s): AMMONIA in the last 168 hours. CBC:  Recent Labs Lab 05/13/15 0441 05/18/15 0915  WBC 13.1* 13.0*  NEUTROABS 11.1* 10.1*  HGB 13.2 14.0  HCT 37.2 39.5  MCV 100.5* 101.0*  PLT 190 138*   Cardiac Enzymes: No results for input(s): CKTOTAL, CKMB, CKMBINDEX, TROPONINI in the last 168 hours.  BNP (last 3 results) No results for input(s): BNP in the last 8760 hours.  ProBNP (last 3 results) No results for input(s): PROBNP in the last 8760 hours.  CBG: No results for input(s): GLUCAP in the last 168 hours.  Radiological Exams on Admission: Dg Shoulder Right  05/18/2015  CLINICAL DATA:  Pt c/o diffuse right shoulder pain s/p alleged fall this week, pt not able, or willing, to communicate more hx. Pt states she hurts all over. Epic chart states pt hx MS, symptoms worsening this week. Pt also c/o n/v EXAM: RIGHT SHOULDER - 2+ VIEW COMPARISON:  None. FINDINGS: No fracture. No dislocation. No significant arthropathic changes. No bone lesion. Soft tissues are unremarkable. IMPRESSION: No fracture or dislocation. Electronically  Signed   By: Amie Portland M.D.   On: 05/18/2015 09:52   Ct Abdomen Pelvis W Contrast  05/18/2015  CLINICAL DATA:  55 year old presenting with 1 day history of generalized abdominal pain, abdominal distention nausea and vomiting, and diarrhea. Patient has hyperbilirubinemia on laboratory evaluation. EXAM: CT ABDOMEN AND PELVIS WITH CONTRAST TECHNIQUE: Multidetector CT imaging of the abdomen and pelvis was performed using the standard protocol following bolus administration of intravenous contrast. CONTRAST:  ISOVUE-300 IOPAMIDOL INJECTION 61% IV. Oral contrast was also administered. COMPARISON:  Abdominal ultrasound performed earlier today. No prior CT. FINDINGS: Lower chest: Linear atelectasis or scarring in the lingula. Visualized lung bases otherwise clear. Heart size normal. Hepatobiliary: Severe diffuse hepatic steatosis. Slight irregularity of the liver contour. Relative enlargement of  the left lobe and caudate lobe. No visible hepatic masses. Gallbladder unremarkable by CT (apparent high attenuation bile is artifactual and related to the fact that the normal bile is of higher attenuation than the severe fatty liver). No biliary ductal dilation. Pancreas: Normal in appearance without evidence of mass, ductal dilation, or inflammation. Spleen: Normal in size and appearance. Adrenals/Urinary Tract: Normal appearing adrenal glands. Kidneys normal in size and appearance without focal parenchymal abnormality. No evidence of urinary tract calculi or obstruction. Normal-appearing decompressed urinary bladder. Stomach/Bowel: Stomach normal in appearance for the degree of distention. Oral contrast material within the distal esophagus which demonstrates mild circumferential wall thickening. No hiatal hernia. Wall thickening involving multiple loops of small bowel, particularly the contrast filled jejunum. Wall thickening involving the decompressed colon to the level of the sigmoid. Appendix not clearly  demonstrated. Vascular/Lymphatic: Mild aortoiliac atherosclerosis without aneurysm. Normal-appearing portal venous and systemic venous systems. No pathologic lymphadenopathy. Reproductive: Normal-appearing uterus. Endometrial thickening and/or fluid likely related to the menstrual phase. No adnexal masses. Other: Moderate amount of ascites throughout the abdomen and pelvis. Diffuse body wall edema. Musculoskeletal: No acute abnormalities. IMPRESSION: 1. Hepatic cirrhosis. Severe diffuse hepatic steatosis. No hepatic parenchymal masses. 2. Moderate amount of ascites throughout the abdomen and pelvis. 3. Wall thickening involving the small bowel and colon. I note that the patient has hypoproteinemia and hypoalbuminemia, and this may account for the diffuse bowel wall thickening. 4. Anasarca. Electronically Signed   By: Hulan Saas M.D.   On: 05/18/2015 12:10    EKG: Independently reviewed. Sinus tachycardia 102, QTC 689  Assessment/Plan  Sepsis secondary to ?SBP vs UTI -Upon admission, she was tachycardic with leukocytosis, tachypnea and lactic acid of 5.4 -CT abdomen showed hepatic cirrhosis, moderate amount of ascites -EDP did perform bedside paracentesis, labs currently pending -UA: 3 VC 6-30, may bacteria, moderate leukocytes, positive nitrites -Blood cultures, urine, fluid culture pending -Placed on Zosyn -Continue monitor lactic acid, CBC  Alcohol withdrawal -placed on CIWA  -Alcohol level 73 -Given banana bag. Continue thiamine, multivitamin, folic acid -patient wishes to go to [place], [place] for detox  Hepatic cirrhosis/elevated LFTs -Likely secondary to alcohol use -Placed on IVF -Continue to monitor CMP  Abdominal pain with nausea and vomiting diarrhea -Possibly due to the above versus withdrawal symptoms -Patient wishes to get this time. -Continue pain control, antiemetics  Prolonged QTc -Will hold Prozac -Repeat EKG in the morning -Avoid QT prolonging  meds  Anxiety/depression/bipolar disorder -Continue Cogentin, Risperdal -Prozac held due prolonged QTc  Migraine headaches -Continue Topamax  Multiple sclerosis/ADHD -Held Provigil due to elevated LFTs  Tobacco abuse -Continue nicotine patch, discussed smoking cessation.   DVT prophylaxis: Lovenox  Code Status: Full  Condition: Guarded  Family Communication: None at bedside. Admission, patients condition and plan of care including tests being ordered have been discussed with the patient, who indicates understanding and agrees with the plan and Code Status.  Disposition Plan: Admitted to stepdown.  Time spent: 70 minutes  Sederick Jacobsen D.O. [facility] Pager [phone]  If 7PM-7AM, please contact night-coverage [email or web address] Password [email or web address] 05/18/2015, 1:58 PM

## 2015-05-18 NOTE — ED Notes (Signed)
Pt is refusing to let me do any sort of procedures for her until she gets medications and water. She is saying that she needs to be comfortable before she will let me hook her up to the heart monitor and get the EKG. Nurse was notified about the delay in EKG and cardiac monitoring.

## 2015-05-18 NOTE — ED Notes (Signed)
Dr. Clarene Duke at bedside to do paracentesis

## 2015-05-18 NOTE — Progress Notes (Signed)
CRITICAL VALUE ALERT  Critical value received:  Lactic acid 3.4  Date of notification:  05/18/15  Time of notification:  1725  Critical value read back: yes  Nurse who received alert:  S.Amory Zbikowski, RN  MD notified (1st page):  Decreased from previous result  Time of first page:    MD notified (2nd page):  Time of second page:  Responding MD:    Time MD responded:

## 2015-05-18 NOTE — ED Provider Notes (Signed)
CSN: [id number]     Arrival date & time 05/18/15  0844 History   First MD Initiated Contact with Patient 05/18/15 0845     Chief Complaint  Patient presents with  . Nausea  . Shoulder Pain     (Consider location/radiation/quality/duration/timing/severity/associated sxs/prior Treatment) HPI Comments: 55yo F w/ PMH including MS, bipolar d/o, alcohol abuse who p/w abdominal pain, vomiting, and diarrhea. The patient has presented here twice this week for vomiting and diarrhea as well as abdominal pain. She was seen on 4/4 and had lab work and ultrasound. They recommended admission but the patient wanted to be discharged. She re-presented for same sx 2 days ago but left without being seen. She returns today reporting one episode of vomiting and 3 episodes of diarrhea in the past 24 hours. She reports generalized, constant, severe abdominal pain which she describes as a burning/indigestion feeling. She denies any urinary symptoms. She reports some right shoulder pain related to a fall that occurred earlier this week. She denies any head injury, loss of consciousness, headache, or visual changes. Normal sensation right hand. She requests admission for alcohol detox, stating that she drinks at least 4-6 liquor beverages a day, last drink was at 4 AM. She endorses withdrawal symptoms but denies any history of withdrawal seizures. She endorses depression but denies any SI, HI, or AVH.  Patient is a 55 y.o. female presenting with shoulder pain. The history is provided by the patient.  Shoulder Pain   Past Medical History  Diagnosis Date  . Multiple sclerosis (HCC)   . Arthritis   . Anxiety   . Bipolar disorder (HCC)   . ADHD (attention deficit hyperactivity disorder)   . Migraine   . Osteoporosis    Past Surgical History  Procedure Laterality Date  . Cesarean section  [id number]  . Myringotomy with tube placement Bilateral   . Tonsillectomy     Family History  Problem Relation Age of Onset  .  Heart disease Mother   . Heart disease Father   . Hypertension Father    Social History  Substance Use Topics  . Smoking status: Current Every Day Smoker -- 0.75 packs/day for 20 years    Types: Cigarettes  . Smokeless tobacco: Never Used     Comment: is cutting back, not in the car, house  . Alcohol Use: 4.8 oz/week    2 Glasses of wine, 2 Cans of beer, 4 Standard drinks or equivalent per week     Comment: "I'm a social drinker." "I drink a glass of wine nightly."   OB History    No data available     Review of Systems 10 Systems reviewed and are negative for acute change except as noted in the HPI.    Allergies  Review of patient's allergies indicates no known allergies.  Home Medications   Prior to Admission medications   Medication Sig Start Date End Date Taking? Authorizing Provider  albuterol (PROVENTIL HFA;VENTOLIN HFA) 108 (90 BASE) MCG/ACT inhaler Inhale 2 puffs into the lungs every 6 (six) hours as needed for wheezing. 02/28/14  Yes Tiffany L Reed, DO  Alum & Mag Hydroxide-Simeth (GI COCKTAIL) SUSP suspension Take 30 mLs by mouth 2 (two) times daily as needed for indigestion. Shake well.   Yes Historical Provider, MD  amantadine (SYMMETREL) 100 MG capsule Take 1 capsule (100 mg total) by mouth 2 (two) times daily. Reported on 02/20/2015 02/21/15  Yes Thermon Leyland, NP  benztropine (COGENTIN) 0.5 MG tablet Take 1  tablet (0.5 mg total) by mouth at bedtime. 02/21/15  Yes Thermon Leyland, NP  clonazePAM (KLONOPIN) 1 MG tablet Take 1 tablet (1 mg total) by mouth 2 (two) times daily as needed for anxiety. 04/06/15  Yes Chelle Jeffery, PA-C  cyclobenzaprine (FLEXERIL) 10 MG tablet Take 0.5-1 tablets (5-10 mg total) by mouth 3 (three) times daily as needed for muscle spasms. 04/06/15  Yes Chelle Jeffery, PA-C  diphenhydrAMINE (BENADRYL) 25 MG tablet Take 50 mg by mouth at bedtime as needed for sleep.    Yes Historical Provider, MD  FLUoxetine (PROZAC) 20 MG capsule Take 1 capsule (20  mg total) by mouth daily. Reported on 02/20/2015 04/15/15  Yes Chelle Jeffery, PA-C  Fluticasone-Salmeterol (ADVAIR) 250-50 MCG/DOSE AEPB Inhale 1 puff into the lungs 2 (two) times daily.   Yes Historical Provider, MD  hydrochlorothiazide (HYDRODIURIL) 12.5 MG tablet Take 1 tablet (12.5 mg total) by mouth daily. Reported on 02/20/2015 02/21/15  Yes Thermon Leyland, NP  hydrOXYzine (ATARAX/VISTARIL) 25 MG tablet Take 1 tablet (25 mg total) by mouth 3 (three) times daily as needed (sleep). 02/21/15  Yes Thermon Leyland, NP  lidocaine (LIDODERM) 5 % Place 1 patch onto the skin daily as needed (for area underarm for redness/swelling/pain.). Reported on 04/06/2015   Yes Historical Provider, MD  meloxicam (MOBIC) 7.5 MG tablet Take 7.5 mg by mouth daily.  03/17/15  Yes Historical Provider, MD  modafinil (PROVIGIL) 200 MG tablet Take 1 tablet (200 mg total) by mouth daily. Reported on 02/20/2015 02/21/15  Yes Thermon Leyland, NP  nicotine (NICODERM CQ - DOSED IN MG/24 HOURS) 21 mg/24hr patch Place 1 patch (21 mg total) onto the skin daily. 02/21/15  Yes Thermon Leyland, NP  ondansetron (ZOFRAN) 4 MG tablet Take 1 tablet (4 mg total) by mouth every 8 (eight) hours as needed for nausea or vomiting. 04/06/15  Yes Chelle Jeffery, PA-C  pantoprazole (PROTONIX) 40 MG tablet Take 1 tablet (40 mg total) by mouth 2 (two) times daily. 04/06/15  Yes Chelle Jeffery, PA-C  risperiDONE (RISPERDAL) 0.5 MG tablet Take 5 tablets (2.5 mg total) by mouth at bedtime. Patient taking differently: Take 1.5 mg by mouth at bedtime.  02/21/15  Yes Thermon Leyland, NP  rizatriptan (MAXALT) 10 MG tablet Take 10 mg by mouth daily as needed for migraine. Reported on 02/20/2015 09/05/14  Yes Historical Provider, MD  sucralfate (CARAFATE) 1 GM/10ML suspension Take 1 g by mouth 4 (four) times daily -  with meals and at bedtime.   Yes Historical Provider, MD  topiramate (TOPAMAX) 100 MG tablet Take 1 tablet (100 mg total) by mouth daily. 02/21/15  Yes Thermon Leyland, NP   chlorpheniramine-HYDROcodone [facility] PENNKINETIC ER) 10-8 MG/5ML SUER Take 5 mLs by mouth every 12 (twelve) hours as needed for cough. Patient not taking: Reported on 05/18/2015 04/06/15   Porfirio Oar, PA-C  Fluticasone-Salmeterol (ADVAIR) 100-50 MCG/DOSE AEPB Inhale 1 puff into the lungs 2 (two) times daily. Patient not taking: Reported on 05/13/2015 02/21/15   Thermon Leyland, NP  gabapentin (NEURONTIN) 100 MG capsule Take 1 capsule (100 mg total) by mouth 3 (three) times daily as needed. Reported on 02/20/2015 Patient not taking: Reported on 05/13/2015 02/21/15   Thermon Leyland, NP  lisdexamfetamine (VYVANSE) 50 MG capsule Take 1 capsule (50 mg total) by mouth daily. Reported on 02/20/2015 Patient not taking: Reported on 04/06/2015 02/21/15   Thermon Leyland, NP   BP 123/75 mmHg  Pulse 103  Temp(Src)  97.8 F (36.6 C) (Oral)  Resp 24  Ht 5\' 2"  (1.575 m)  Wt 200 lb (90.719 kg)  BMI 36.57 kg/m2  SpO2 98% Physical Exam  Constitutional: She is oriented to person, place, and time. She appears well-developed and well-nourished. No distress.  Uncomfortable, mildly dyspneic  HENT:  Head: Normocephalic and atraumatic.  Extremely dry mucous membranes, cracked lips  Eyes: Pupils are equal, round, and reactive to light. Scleral icterus is present.  Neck: Neck supple.  Cardiovascular: Regular rhythm and normal heart sounds.  Tachycardia present.   No murmur heard. Pulmonary/Chest:  Mildly increased WOB, diminished BS b/l  Abdominal: Bowel sounds are normal. She exhibits distension. There is tenderness. There is no rebound and no guarding.  abd moderately distended and firm, TTP worst in RUQ, midepigastrium, no peritonitis  Musculoskeletal: She exhibits no edema.  Neurological: She is alert and oriented to person, place, and time.  Fluent speech  Skin: Skin is warm and dry.  Psychiatric:  Depressed, anxious  Nursing note and vitals reviewed.   ED Course  .Paracentesis Date/Time: 05/18/2015 1:32  PM Performed by: Laurence Spates Authorized by: Laurence Spates Consent: Verbal consent obtained. Written consent obtained. Risks and benefits: risks, benefits and alternatives were discussed Consent given by: patient Patient understanding: patient states understanding of the procedure being performed Patient consent: the patient's understanding of the procedure matches consent given Procedure consent: procedure consent matches procedure scheduled Patient identity confirmed: verbally with patient Initial or subsequent exam: initial Procedure purpose: diagnostic Indications: abdominal discomfort secondary to ascites Anesthesia: local infiltration Local anesthetic: lidocaine 1% with epinephrine Anesthetic total: 4 ml Patient sedated: no Preparation: Patient was prepped and draped in the usual sterile fashion. Needle gauge: 18 Ultrasound guidance: yes Puncture site: right lower quadrant Fluid removed: 20(ml) Fluid appearance: clear Dressing: 4x4 sterile gauze Patient tolerance: Patient tolerated the procedure well with no immediate complications   (including critical care time) CRITICAL CARE Performed by: Ambrose Finland Little   Total critical care time: 60 minutes  Critical care time was exclusive of separately billable procedures and treating other patients.  Critical care was necessary to treat or prevent imminent or life-threatening deterioration.  Critical care was time spent personally by me on the following activities: development of treatment plan with patient and/or surrogate as well as nursing, discussions with consultants, evaluation of patient's response to treatment, examination of patient, obtaining history from patient or surrogate, ordering and performing treatments and interventions, ordering and review of laboratory studies, ordering and review of radiographic studies, pulse oximetry and re-evaluation of patient's condition.  Labs Review Labs Reviewed   COMPREHENSIVE METABOLIC PANEL - Abnormal; Notable for the following:    Sodium 130 (*)    Chloride 90 (*)    Glucose, Bld 106 (*)    BUN <5 (*)    Creatinine, Ser <0.30 (*)    Calcium 7.9 (*)    Total Protein 6.1 (*)    Albumin 2.5 (*)    AST 174 (*)    ALT 126 (*)    Alkaline Phosphatase 359 (*)    Total Bilirubin 8.7 (*)    All other components within normal limits  CBC WITH DIFFERENTIAL/PLATELET - Abnormal; Notable for the following:    WBC 13.0 (*)    MCV 101.0 (*)    MCH 35.8 (*)    RDW 19.8 (*)    Platelets 138 (*)    Neutro Abs 10.1 (*)    Monocytes Absolute 1.4 (*)  All other components within normal limits  URINALYSIS, ROUTINE W REFLEX MICROSCOPIC (NOT AT [facility]) - Abnormal; Notable for the following:    Color, Urine ORANGE (*)    APPearance CLOUDY (*)    Hgb urine dipstick LARGE (*)    Bilirubin Urine LARGE (*)    Ketones, ur 40 (*)    Protein, ur 30 (*)    Nitrite POSITIVE (*)    Leukocytes, UA MODERATE (*)    All other components within normal limits  ETHANOL - Abnormal; Notable for the following:    Alcohol, Ethyl (B) 73 (*)    All other components within normal limits  URINE MICROSCOPIC-ADD ON - Abnormal; Notable for the following:    Squamous Epithelial / LPF 6-30 (*)    Bacteria, UA MANY (*)    All other components within normal limits  I-STAT CG4 LACTIC ACID, ED - Abnormal; Notable for the following:    Lactic Acid, Venous 4.71 (*)    All other components within normal limits  I-STAT CG4 LACTIC ACID, ED - Abnormal; Notable for the following:    Lactic Acid, Venous 5.43 (*)    All other components within normal limits  URINE CULTURE  CULTURE, BLOOD (ROUTINE X 2)  CULTURE, BLOOD (ROUTINE X 2)  BODY FLUID CULTURE  LACTATE DEHYDROGENASE, BODY FLUID  GLUCOSE, PERITONEAL FLUID  PROTEIN, BODY FLUID  ALBUMIN, FLUID  PROTIME-INR    Imaging Review Dg Shoulder Right  05/18/2015  CLINICAL DATA:  Pt c/o diffuse right shoulder pain s/p alleged fall this  week, pt not able, or willing, to communicate more hx. Pt states she hurts all over. Epic chart states pt hx MS, symptoms worsening this week. Pt also c/o n/v EXAM: RIGHT SHOULDER - 2+ VIEW COMPARISON:  None. FINDINGS: No fracture. No dislocation. No significant arthropathic changes. No bone lesion. Soft tissues are unremarkable. IMPRESSION: No fracture or dislocation. Electronically Signed   By: Amie Portland M.D.   On: 05/18/2015 09:52   Ct Abdomen Pelvis W Contrast  05/18/2015  CLINICAL DATA:  55 year old presenting with 1 day history of generalized abdominal pain, abdominal distention nausea and vomiting, and diarrhea. Patient has hyperbilirubinemia on laboratory evaluation. EXAM: CT ABDOMEN AND PELVIS WITH CONTRAST TECHNIQUE: Multidetector CT imaging of the abdomen and pelvis was performed using the standard protocol following bolus administration of intravenous contrast. CONTRAST:  ISOVUE-300 IOPAMIDOL INJECTION 61% IV. Oral contrast was also administered. COMPARISON:  Abdominal ultrasound performed earlier today. No prior CT. FINDINGS: Lower chest: Linear atelectasis or scarring in the lingula. Visualized lung bases otherwise clear. Heart size normal. Hepatobiliary: Severe diffuse hepatic steatosis. Slight irregularity of the liver contour. Relative enlargement of the left lobe and caudate lobe. No visible hepatic masses. Gallbladder unremarkable by CT (apparent high attenuation bile is artifactual and related to the fact that the normal bile is of higher attenuation than the severe fatty liver). No biliary ductal dilation. Pancreas: Normal in appearance without evidence of mass, ductal dilation, or inflammation. Spleen: Normal in size and appearance. Adrenals/Urinary Tract: Normal appearing adrenal glands. Kidneys normal in size and appearance without focal parenchymal abnormality. No evidence of urinary tract calculi or obstruction. Normal-appearing decompressed urinary bladder. Stomach/Bowel:  Stomach normal in appearance for the degree of distention. Oral contrast material within the distal esophagus which demonstrates mild circumferential wall thickening. No hiatal hernia. Wall thickening involving multiple loops of small bowel, particularly the contrast filled jejunum. Wall thickening involving the decompressed colon to the level of the sigmoid. Appendix not  clearly demonstrated. Vascular/Lymphatic: Mild aortoiliac atherosclerosis without aneurysm. Normal-appearing portal venous and systemic venous systems. No pathologic lymphadenopathy. Reproductive: Normal-appearing uterus. Endometrial thickening and/or fluid likely related to the menstrual phase. No adnexal masses. Other: Moderate amount of ascites throughout the abdomen and pelvis. Diffuse body wall edema. Musculoskeletal: No acute abnormalities. IMPRESSION: 1. Hepatic cirrhosis. Severe diffuse hepatic steatosis. No hepatic parenchymal masses. 2. Moderate amount of ascites throughout the abdomen and pelvis. 3. Wall thickening involving the small bowel and colon. I note that the patient has hypoproteinemia and hypoalbuminemia, and this may account for the diffuse bowel wall thickening. 4. Anasarca. Electronically Signed   By: Hulan Saas M.D.   On: 05/18/2015 12:10   I have personally reviewed and evaluated these lab results as part of my medical decision-making.   EKG Interpretation   Date/Time:  Sunday May 18 2015 09:58:33 EDT Ventricular Rate:  102 PR Interval:  133 QRS Duration: 91 QT Interval:  529 QTC Calculation: 689 R Axis:   75 Text Interpretation:  Sinus tachycardia Low voltage, precordial leads  Borderline T abnormalities, anterior leads Prolonged QT interval prolonged  QT new from previous Confirmed by LITTLE MD, RACHEL [phone]) on 05/18/2015  1:45:15 PM     Medications  LORazepam (ATIVAN) injection 0-4 mg (1 mg Intravenous Given 05/18/15 1301)    Followed by  LORazepam (ATIVAN) injection 0-4 mg (not  administered)  LORazepam (ATIVAN) injection 1 mg (not administered)  sodium chloride 0.9 % bolus 1,000 mL (0 mLs Intravenous Stopped 05/18/15 1030)  ondansetron (ZOFRAN) injection 4 mg (4 mg Intravenous Given 05/18/15 0928)  ipratropium-albuterol (DUONEB) 0.5-2.5 (3) MG/3ML nebulizer solution 3 mL (3 mLs Nebulization Given 05/18/15 0928)  multivitamins adult 10 mL in dextrose 5% lactated ringers 1,000 mL infusion ( Intravenous Given 05/18/15 1106)  thiamine (B-1) injection 100 mg (100 mg Intravenous Given 05/18/15 1012)  sodium chloride 0.9 % bolus 1,000 mL (0 mLs Intravenous Stopped 05/18/15 1130)  magnesium sulfate IVPB 2 g 50 mL (0 g Intravenous Stopped 05/18/15 1105)  LORazepam (ATIVAN) injection 1 mg (1 mg Intravenous Given 05/18/15 1038)  potassium chloride 10 mEq in 100 mL IVPB (0 mEq Intravenous Stopped 05/18/15 1200)  calcium gluconate 1 g in sodium chloride 0.9 % 100 mL IVPB (0 g Intravenous Stopped 05/18/15 1233)  piperacillin-tazobactam (ZOSYN) IVPB 3.375 g (0 g Intravenous Stopped 05/18/15 1310)  fentaNYL (SUBLIMAZE) injection 50 mcg (50 mcg Intravenous Given 05/18/15 1246)  sodium chloride 0.9 % bolus 1,000 mL (0 mLs Intravenous Stopped 05/18/15 1302)  iopamidol (ISOVUE-300) 61 % injection 100 mL (100 mLs Intravenous Contrast Given 05/18/15 1139)  lidocaine-EPINEPHrine (XYLOCAINE W/EPI) 1 %-1:100000 (with pres) injection 10 mL (1 mL Other Given 05/18/15 1303)    MDM   Final diagnoses:  Generalized abdominal pain  Alcohol withdrawal, uncomplicated (HCC)  Lactic acidosis  UTI (lower urinary tract infection)   Pt w/ h/o alcohol abuse p/w ongoing vomiting, diarrhea, and abdominal pain. Ultrasound several days ago showed moderate ascites, fatty liver, no acute abnormalities of gallbladder. On exam, the patient was anxious and depressed, mildly dyspneic but in no acute distress. Vital signs unremarkable. She had mildly increased work of breathing with diminished breath sounds. Abdomen was moderately distended,  firm, but not peritoneal headache. She endorses generalized abdominal pain but had tenderness only in right upper quadrant and midepigastrium. Obtained above lab work including lactate and gave the patient Zofran and an IV fluid bolus. XR shoulder unremarkable.  EKG shows sinus tachycardia and prolonged QT from previous. Gave  the patient potassium, calcium, magnesium as well as multivitamin cocktail and thiamine.  Labs show an initial lactate of 4.7, sodium 130, chloride 90, glucose 106, anion gap 14, AST 174, ALT 126, bilirubin 8.7. UA with nitrite positive, large amount of bacteria and WBCs consistent with infection. I did initiate a sepsis protocol on the patient because of her tachycardia, lactic acidosis, and UTI, though I suspect that some of her lactate is due to dehydration based on exam. Gave zosyn for intraabdominal coverage. Obtained CT to rule out acute intra-abdominal process. CT shows cirrhosis with moderate ascites and anasarca. Given this information, differential includes SBP. Obtained consent and performed a diagnostic paracentesis at bedside; see procedure note for details. Patient tolerated it well. I have given several doses of Ativan for tachycardia likely related to alcohol withdrawal. On reexamination, the patient is mildly uncomfortable but continues to [facility] appropriately with stable vital signs. I discussed admission with [facility] hospitalist, Dr. Catha Gosselin and pt admitted to stepdown for further care.  Laurence Spates, MD 05/18/15 [phone]

## 2015-05-18 NOTE — Progress Notes (Signed)
Pharmacy Antibiotic Note  Anne Black is a 55 y.o. female admitted on 05/18/2015 with sepsis secondary to intra-abdominal infection.  LA elevated. CT abdomen shows hepatic cirrhosis.  EDP performed bedside paracentesis and cultures pending. Pharmacy has been consulted for Zosyn dosing.    Plan: Zosyn 3.375g IV q8h (infuse over 4 hours)  Height: 5\' 2"  (157.5 cm) Weight: 200 lb (90.719 kg) IBW/kg (Calculated) : 50.1  Temp (24hrs), Avg:97.8 F (36.6 C), Min:97.8 F (36.6 C), Max:97.8 F (36.6 C)   Recent Labs Lab 05/13/15 0441 05/18/15 0915 05/18/15 0924 05/18/15 1227  WBC 13.1* 13.0*  --   --   CREATININE 0.45 <0.30*  --   --   LATICACIDVEN  --   --  4.71* 5.43*    CrCl cannot be calculated (Patient has no serum creatinine result on file.).    No Known Allergies  Antimicrobials this admission: 4/9 >> Zosyn >>  Dose adjustments this admission:   Microbiology results: 45 BCx: IP 4/9 UCx: IP (U/A+bacteria, leuks, nitrites) 4/9 Body fluid culture: IP  Thank you for allowing pharmacy to be a part of this patient's care.  Haynes Hoehn, PharmD, BCPS 05/18/2015, 3:36 PM  Pager: [phone]

## 2015-05-18 NOTE — ED Notes (Addendum)
Pt reported n/v x1 and diarrhea x3 in past 24hr, generalized abd pain, burning/indigestion. Rt shoulder pain s/p to losing balance and falling denies hitting head, LOC, visual disturbances or headaches. No deformity/bruising/swelling to rt shoulder, (+)PMS, CRT brisk, LROM. Pt reported that she needs to go to [facility] because she feels depressed but denies SI/HI and AVH. Pt reported ETOH last intake 0400.

## 2015-05-18 NOTE — ED Notes (Signed)
Pt taken to x-ray and returned to room without distress noted. 

## 2015-05-18 NOTE — ED Notes (Signed)
Pt taken to CT scan without distress noted.

## 2015-05-19 LAB — COMPREHENSIVE METABOLIC PANEL
ALT: 112 U/L — ABNORMAL HIGH (ref 14–54)
AST: 139 U/L — ABNORMAL HIGH (ref 15–41)
Albumin: 2.4 g/dL — ABNORMAL LOW (ref 3.5–5.0)
Alkaline Phosphatase: 313 U/L — ABNORMAL HIGH (ref 38–126)
Anion gap: 12 (ref 5–15)
BUN: <5 mg/dL — ABNORMAL LOW (ref 6–20)
CO2: 25 mmol/L (ref 22–32)
Calcium: 7.9 mg/dL — ABNORMAL LOW (ref 8.9–10.3)
Chloride: 95 mmol/L — ABNORMAL LOW (ref 101–111)
Creatinine, Ser: 0.39 mg/dL — ABNORMAL LOW (ref 0.44–1.00)
GFR calc Af Amer: >60 mL/min (ref >60)
GFR calc non Af Amer: >60 mL/min (ref >60)
Glucose, Bld: 124 mg/dL — ABNORMAL HIGH (ref 65–99)
Potassium: 2.7 mmol/L — CL (ref 3.5–5.1)
Sodium: 132 mmol/L — ABNORMAL LOW (ref 135–145)
Total Bilirubin: 10 mg/dL — ABNORMAL HIGH (ref 0.3–1.2)
Total Protein: 5.4 g/dL — ABNORMAL LOW (ref 6.5–8.1)

## 2015-05-19 LAB — MAGNESIUM: Magnesium: 2.2 mg/dL (ref 1.7–2.4)

## 2015-05-19 LAB — BLOOD GAS, ARTERIAL
ACID-BASE EXCESS: 3.5 mmol/L — AB (ref 0.0–2.0)
BICARBONATE: 26.8 meq/L — AB (ref 20.0–24.0)
Drawn by: 295031
O2 Content: 2 L/min
O2 SAT: 96 %
PATIENT TEMPERATURE: 37
PCO2 ART: 37.2 mmHg (ref 35.0–45.0)
PO2 ART: 82.6 mmHg (ref 80.0–100.0)
TCO2: 23.8 mmol/L (ref 0–100)
pH, Arterial: 7.471 — ABNORMAL HIGH (ref 7.350–7.450)

## 2015-05-19 LAB — PROTIME-INR
INR: 1.45 (ref 0.00–1.49)
Prothrombin Time: 17.2 seconds — ABNORMAL HIGH (ref 11.6–15.2)

## 2015-05-19 LAB — LACTIC ACID, PLASMA: LACTIC ACID, VENOUS: 1.9 mmol/L (ref 0.5–2.0)

## 2015-05-19 LAB — CBC
HCT: 35.6 % — ABNORMAL LOW (ref 36.0–46.0)
Hemoglobin: 12.3 g/dL (ref 12.0–15.0)
MCH: 35.3 pg — ABNORMAL HIGH (ref 26.0–34.0)
MCHC: 34.6 g/dL (ref 30.0–36.0)
MCV: 102.3 fL — ABNORMAL HIGH (ref 78.0–100.0)
Platelets: 126 10*3/uL — ABNORMAL LOW (ref 150–400)
RBC: 3.48 MIL/uL — ABNORMAL LOW (ref 3.87–5.11)
RDW: 20.2 % — ABNORMAL HIGH (ref 11.5–15.5)
WBC: 11.8 10*3/uL — ABNORMAL HIGH (ref 4.0–10.5)

## 2015-05-19 LAB — AMMONIA: AMMONIA: 108 umol/L — AB (ref 9–35)

## 2015-05-19 MED ORDER — POTASSIUM CHLORIDE 10 MEQ/100ML IV SOLN
10.0000 meq | INTRAVENOUS | Status: AC
Start: 2015-05-19 — End: 2015-05-19
  Administered 2015-05-19 (×4): 10 meq via INTRAVENOUS
  Filled 2015-05-19 (×4): qty 100

## 2015-05-19 MED ORDER — POTASSIUM CHLORIDE CRYS ER 20 MEQ PO TBCR
40.0000 meq | EXTENDED_RELEASE_TABLET | ORAL | Status: AC
  Administered 2015-05-19 (×2): 40 meq via ORAL
  Filled 2015-05-19 (×2): qty 2

## 2015-05-19 MED ORDER — GUAIFENESIN-DM 100-10 MG/5ML PO SYRP
5.0000 mL | ORAL_SOLUTION | ORAL | Status: DC | PRN
Start: 2015-05-19 — End: 2015-05-21
  Administered 2015-05-19: 5 mL via ORAL
  Filled 2015-05-19: qty 10

## 2015-05-19 MED ORDER — FUROSEMIDE 10 MG/ML IJ SOLN
20.0000 mg | Freq: Once | INTRAMUSCULAR | Status: AC
  Administered 2015-05-19: 20 mg via INTRAVENOUS
  Filled 2015-05-19: qty 2

## 2015-05-19 MED ORDER — LACTULOSE 10 GM/15ML PO SOLN
30.0000 g | Freq: Three times a day (TID) | ORAL | Status: DC
  Administered 2015-05-19: 30 g via ORAL
  Filled 2015-05-19 (×2): qty 45

## 2015-05-19 NOTE — Progress Notes (Signed)
PROGRESS NOTE  Anne Black ZOX:[id number] DOB: Mar 05, 1960 DOA: 05/18/2015 PCP: No primary care provider on file. Outpatient Specialists:    LOS: 1 day   Brief Narrative:  55 y.o. female with a history of multiple sclerosis, bipolar disorder, depression and anxiety, and ETOH dependence presented to the emergency department 3 episodes of diarrhea with nausea and vomiting in the last day. She also complained of some burning abdominal pain which is been constant, more so in the upper right quadrant. Patient has been to the emergency department twice this past week for similar presentation and refused admission on 1 occasion. Patient also states that she would like to undergo alcohol detox. In the emergency department, patient was found to have tachycardia with elevated lactic acid and leukocytosis. CT of the abdomen showed hepatic cirrhosis.  Assessment & Plan: Active Problems:   MS (multiple sclerosis) (HCC)   Bipolar disorder, curr episode mixed, severe, w/o psychotic features (HCC)   Nicotine abuse   Alcohol withdrawal (HCC)   Abdominal pain   Anxiety   Sepsis (HCC)   UTI (lower urinary tract infection)   Hepatic cirrhosis (HCC)   Sepsis secondary to ?SBP vs UTI - Upon admission, she was tachycardic with leukocytosis, tachypnea and lactic acid of 5.4 - CT abdomen showed hepatic cirrhosis, moderate amount of ascites - EDP did perform bedside paracentesis,  - UA: 3 VC 6-30, may bacteria, moderate leukocytes, positive nitrites - Continue Zosyn - Lactic acid normalized to 1.9, this morning, discontinue IV fluids given anasarca - Body fluid cell count with diff on paracentesis is pending  Hypokalemia - 04/10 potassium was 2.7 - Receiving potassium chloride  - Monitor  Alcohol abuse with withdrawal - On CIWA  - Continue thiamine, multivitamin, folic acid - patient wishes to go to [place], [place] for detox  Hepatic cirrhosis/elevated LFTs/decompensated with ascites as well as hepatic  encephalopathy - Likely secondary to alcohol use - Decompensated with ascites, anasarca, hepatic encephalopathy - Ordered Lasix - Ammonia: 108, ordered lactulose - MELD = 19, DF: 19.2 - Mental status fluctuates, currently she is confused. Asking to go home however does not have capacity to make medical decisions  Abdominal pain with nausea and vomiting diarrhea - Possibly due to the above versus withdrawal symptoms - Continue pain control, antiemetics  Prolonged QTc - Avoid QT prolonging meds  Anxiety/depression/bipolar disorder - Continue Cogentin, Risperdal, Prozac  Migraine headaches - Continue Topamax  Multiple sclerosis/ADHD - Hold Provigil due to elevated LFTs    Tobacco abuse - Continue nicotine patch  Hyponatremia - Likely in the setting of fluid overload, will receive Lasix, continue to monitor  Macrocytosis - Likely in the setting of alcohol abuse, check vitamin B12 and folic acid  Thrombocytopenia - Due to liver disease, monitor, discontinue Lovenox if it goes less than 100 K   DVT prophylaxis: Lovenox Code Status: Full Condition: Guarded Family Communication: None at bedside. Communicated plan with patient. Disposition Plan: Admitted to stepdown.  Antimicrobials: - Zosyn 4/9 >>  Subjective: Patient was confused. Speech was hard to understand, no specific complaints, asking to go home  Objective: Filed Vitals:   05/19/15 0600 05/19/15 0800 05/19/15 0835 05/19/15 0900  BP: 104/52     Pulse: 100 101  101  Temp:   98.3 F (36.8 C)   TempSrc:      Resp: 25 26  25   Height:      Weight:      SpO2: 99% 97%  98%    Intake/Output Summary (Last  24 hours) at 05/19/15 1124 Last data filed at 05/19/15 0900  Gross per 24 hour  Intake    940 ml  Output    325 ml  Net    615 ml   Filed Weights   05/18/15 0900 05/18/15 1530 05/19/15 0305  Weight: 90.719 kg (200 lb) 97.8 kg (215 lb 9.8 oz) 97.9 kg (215 lb 13.3 oz)    Examination: Constitutional:  Appears anxious  Filed Vitals:   05/19/15 0600 05/19/15 0800 05/19/15 0835 05/19/15 0900  BP: 104/52     Pulse: 100 101  101  Temp:   98.3 F (36.8 C)   TempSrc:      Resp: 25 26  25   Height:      Weight:      SpO2: 99% 97%  98%   Eyes: PERRL, + sclerae icterus  ENMT: Mucous membranes are moist.  Respiratory: clear to auscultation bilaterally, no wheezing, no crackles. Abdominal breathing pattern   Cardiovascular: Regular rate and rhythm, no murmurs / rubs / gallops.  2+ Bilateral extremity pitting edema. pedal pulses present.  Abdomen: no tenderness, no masses palpated. Bowel sounds positive. Distended   Neurologic: confused. Speech is unclear. Nonfocal   Data Reviewed: I have personally reviewed following labs and imaging studies  CBC:  Recent Labs Lab 05/13/15 0441 05/18/15 0915 05/18/15 1617 05/19/15 0329  WBC 13.1* 13.0* 12.4* 11.8*  NEUTROABS 11.1* 10.1* 9.7*  --   HGB 13.2 14.0 12.2 12.3  HCT 37.2 39.5 35.1* 35.6*  MCV 100.5* 101.0* 101.4* 102.3*  PLT 190 138* 134* 126*   Basic Metabolic Panel:  Recent Labs Lab 05/13/15 0441 05/18/15 0915 05/18/15 1617 05/19/15 0329  NA 133* 130* 130* 132*  K 2.9* 4.1 3.0* 2.7*  CL 90* 90* 95* 95*  CO2 26 26 23 25   GLUCOSE 92 106* 153* 124*  BUN <5* <5* <5* <5*  CREATININE 0.45 <0.30* <0.30* 0.39*  CALCIUM 8.3* 7.9* 7.6* 7.9*  MG  --   --   --  2.2   GFR: Estimated Creatinine Clearance: 86.8 mL/min (by C-G formula based on Cr of 0.39). Liver Function Tests:  Recent Labs Lab 05/13/15 0441 05/18/15 0915 05/18/15 1617 05/19/15 0329  AST 182* 174* 154* 139*  ALT 94* 126* 116* 112*  ALKPHOS 327* 359* 336* 313*  BILITOT 7.9* 8.7* 8.8* 10.0*  PROT 6.1* 6.1* 5.7* 5.4*  ALBUMIN 2.6* 2.5* 2.2* 2.4*    Recent Labs Lab 05/13/15 0441  LIPASE 47   No results for input(s): AMMONIA in the last 168 hours. Coagulation Profile:  Recent Labs Lab 05/18/15 1617 05/19/15 0955  INR 1.32 1.45   Cardiac  Enzymes: No results for input(s): CKTOTAL, CKMB, CKMBINDEX, TROPONINI in the last 168 hours. BNP (last 3 results) No results for input(s): PROBNP in the last 8760 hours. HbA1C: No results for input(s): HGBA1C in the last 72 hours. CBG: No results for input(s): GLUCAP in the last 168 hours. Lipid Profile: No results for input(s): CHOL, HDL, LDLCALC, TRIG, CHOLHDL, LDLDIRECT in the last 72 hours. Thyroid Function Tests: No results for input(s): TSH, T4TOTAL, FREET4, T3FREE, THYROIDAB in the last 72 hours. Anemia Panel: No results for input(s): VITAMINB12, FOLATE, FERRITIN, TIBC, IRON, RETICCTPCT in the last 72 hours. Urine analysis:    Component Value Date/Time   COLORURINE ORANGE* 05/18/2015 1033   APPEARANCEUR CLOUDY* 05/18/2015 1033   LABSPEC 1.021 05/18/2015 1033   PHURINE 6.0 05/18/2015 1033   GLUCOSEU NEGATIVE 05/18/2015 1033   HGBUR LARGE* 05/18/2015  1033   BILIRUBINUR LARGE* 05/18/2015 1033   KETONESUR 40* 05/18/2015 1033   PROTEINUR 30* 05/18/2015 1033   UROBILINOGEN 0.2 09/14/2011 2141   NITRITE POSITIVE* 05/18/2015 1033   LEUKOCYTESUR MODERATE* 05/18/2015 1033   Sepsis Labs: Invalid input(s): PROCALCITONIN, LACTICIDVEN  Recent Results (from the past 240 hour(s))  Culture, body fluid-bottle     Status: None (Preliminary result)   Collection Time: 05/18/15  2:01 PM  Result Value Ref Range Status   Specimen Description FLUID  Final   Special Requests BOTTLES DRAWN AEROBIC ONLY 10CC  Final   Gram Stain   Final    RARE WBC PRESENT,BOTH PMN AND MONONUCLEAR NO ORGANISMS SEEN Performed at [facility]    Culture PENDING  Incomplete   Report Status PENDING  Incomplete  MRSA PCR Screening     Status: Abnormal   Collection Time: 05/18/15  6:00 PM  Result Value Ref Range Status   MRSA by PCR POSITIVE (A) NEGATIVE Final    Comment:        The GeneXpert MRSA Assay (FDA approved for NASAL specimens only), is one component of a comprehensive MRSA  colonization surveillance program. It is not intended to diagnose MRSA infection nor to guide or monitor treatment for MRSA infections. RESULT CALLED TO, READ BACK BY AND VERIFIED WITH: M.HAIRSTON,RN AT 1958 ON 05/18/15 BY W.SHEA       Radiology Studies: Dg Shoulder Right  05/18/2015  CLINICAL DATA:  Pt c/o diffuse right shoulder pain s/p alleged fall this week, pt not able, or willing, to communicate more hx. Pt states she hurts all over. Epic chart states pt hx MS, symptoms worsening this week. Pt also c/o n/v EXAM: RIGHT SHOULDER - 2+ VIEW COMPARISON:  None. FINDINGS: No fracture. No dislocation. No significant arthropathic changes. No bone lesion. Soft tissues are unremarkable. IMPRESSION: No fracture or dislocation. Electronically Signed   By: Amie Portland M.D.   On: 05/18/2015 09:52   Ct Abdomen Pelvis W Contrast  05/18/2015  CLINICAL DATA:  55 year old presenting with 1 day history of generalized abdominal pain, abdominal distention nausea and vomiting, and diarrhea. Patient has hyperbilirubinemia on laboratory evaluation. EXAM: CT ABDOMEN AND PELVIS WITH CONTRAST TECHNIQUE: Multidetector CT imaging of the abdomen and pelvis was performed using the standard protocol following bolus administration of intravenous contrast. CONTRAST:  ISOVUE-300 IOPAMIDOL INJECTION 61% IV. Oral contrast was also administered. COMPARISON:  Abdominal ultrasound performed earlier today. No prior CT. FINDINGS: Lower chest: Linear atelectasis or scarring in the lingula. Visualized lung bases otherwise clear. Heart size normal. Hepatobiliary: Severe diffuse hepatic steatosis. Slight irregularity of the liver contour. Relative enlargement of the left lobe and caudate lobe. No visible hepatic masses. Gallbladder unremarkable by CT (apparent high attenuation bile is artifactual and related to the fact that the normal bile is of higher attenuation than the severe fatty liver). No biliary ductal dilation. Pancreas:  Normal in appearance without evidence of mass, ductal dilation, or inflammation. Spleen: Normal in size and appearance. Adrenals/Urinary Tract: Normal appearing adrenal glands. Kidneys normal in size and appearance without focal parenchymal abnormality. No evidence of urinary tract calculi or obstruction. Normal-appearing decompressed urinary bladder. Stomach/Bowel: Stomach normal in appearance for the degree of distention. Oral contrast material within the distal esophagus which demonstrates mild circumferential wall thickening. No hiatal hernia. Wall thickening involving multiple loops of small bowel, particularly the contrast filled jejunum. Wall thickening involving the decompressed colon to the level of the sigmoid. Appendix not clearly demonstrated. Vascular/Lymphatic: Mild aortoiliac  atherosclerosis without aneurysm. Normal-appearing portal venous and systemic venous systems. No pathologic lymphadenopathy. Reproductive: Normal-appearing uterus. Endometrial thickening and/or fluid likely related to the menstrual phase. No adnexal masses. Other: Moderate amount of ascites throughout the abdomen and pelvis. Diffuse body wall edema. Musculoskeletal: No acute abnormalities. IMPRESSION: 1. Hepatic cirrhosis. Severe diffuse hepatic steatosis. No hepatic parenchymal masses. 2. Moderate amount of ascites throughout the abdomen and pelvis. 3. Wall thickening involving the small bowel and colon. I note that the patient has hypoproteinemia and hypoalbuminemia, and this may account for the diffuse bowel wall thickening. 4. Anasarca. Electronically Signed   By: Hulan Saas M.D.   On: 05/18/2015 12:10   Dg Chest Port 1 View  05/18/2015  CLINICAL DATA:  Patient with sepsis.  Cough. EXAM: PORTABLE CHEST 1 VIEW COMPARISON:  Chest radiograph 04/06/2015. FINDINGS: Multiple monitoring leads overlie the patient. Stable cardiac and mediastinal contours. No consolidative pulmonary opacity. No pleural effusion or pneumothorax.  IMPRESSION: No acute cardiopulmonary process. Electronically Signed   By: Annia Belt M.D.   On: 05/18/2015 15:54     Scheduled Meds: . amantadine  100 mg Oral BID  . benztropine  0.5 mg Oral QHS  . Chlorhexidine Gluconate Cloth  6 each Topical Q0600  . enoxaparin (LOVENOX) injection  40 mg Subcutaneous Q24H  . FLUoxetine  20 mg Oral Daily  . folic acid  1 mg Oral Daily  . LORazepam  0-4 mg Intravenous 4 times per day   Followed by  . [START ON 05/20/2015] LORazepam  0-4 mg Intravenous Q12H  . LORazepam  1 mg Intravenous Once  . modafinil  200 mg Oral Daily  . mometasone-formoterol  2 puff Inhalation BID  . multivitamin with minerals  1 tablet Oral Daily  . mupirocin ointment  1 application Nasal BID  . nicotine  21 mg Transdermal Daily  . pantoprazole  40 mg Oral BID  . piperacillin-tazobactam (ZOSYN)  IV  3.375 g Intravenous 3 times per day  . potassium chloride  40 mEq Oral Q3H  . risperiDONE  1.5 mg Oral QHS  . sodium chloride flush  3 mL Intravenous Q12H  . sucralfate  1 g Oral TID WC & HS  . thiamine  100 mg Oral Daily  . topiramate  100 mg Oral Daily   Continuous Infusions:   0.9% sodium chloride infusion   Pamella Pert, MD, PhD [facility] Pager [phone]  If 7PM-7AM, please contact night-coverage [email or web address] Password [email or web address] 05/19/2015, 11:24 AM

## 2015-05-20 LAB — COMPREHENSIVE METABOLIC PANEL
ALT: 100 U/L — ABNORMAL HIGH (ref 14–54)
ANION GAP: 9 (ref 5–15)
AST: 132 U/L — ABNORMAL HIGH (ref 15–41)
Albumin: 1.9 g/dL — ABNORMAL LOW (ref 3.5–5.0)
Alkaline Phosphatase: 278 U/L — ABNORMAL HIGH (ref 38–126)
BILIRUBIN TOTAL: 10.8 mg/dL — AB (ref 0.3–1.2)
BUN: <5 mg/dL — ABNORMAL LOW (ref 6–20)
CHLORIDE: 98 mmol/L — AB (ref 101–111)
CO2: 27 mmol/L (ref 22–32)
Calcium: 7.6 mg/dL — ABNORMAL LOW (ref 8.9–10.3)
Creatinine, Ser: <0.3 mg/dL — ABNORMAL LOW (ref 0.44–1.00)
Glucose, Bld: 89 mg/dL (ref 65–99)
POTASSIUM: 2.8 mmol/L — AB (ref 3.5–5.1)
Sodium: 134 mmol/L — ABNORMAL LOW (ref 135–145)
TOTAL PROTEIN: 4.8 g/dL — AB (ref 6.5–8.1)

## 2015-05-20 LAB — CBC
HEMATOCRIT: 32.7 % — AB (ref 36.0–46.0)
Hemoglobin: 11.1 g/dL — ABNORMAL LOW (ref 12.0–15.0)
MCH: 35.1 pg — ABNORMAL HIGH (ref 26.0–34.0)
MCHC: 33.9 g/dL (ref 30.0–36.0)
MCV: 103.5 fL — AB (ref 78.0–100.0)
Platelets: 120 10*3/uL — ABNORMAL LOW (ref 150–400)
RBC: 3.16 MIL/uL — AB (ref 3.87–5.11)
RDW: 20.3 % — ABNORMAL HIGH (ref 11.5–15.5)
WBC: 10.1 10*3/uL (ref 4.0–10.5)

## 2015-05-20 MED ORDER — POTASSIUM CHLORIDE 10 MEQ/100ML IV SOLN
10.0000 meq | INTRAVENOUS | Status: AC
  Administered 2015-05-20 (×4): 10 meq via INTRAVENOUS
  Filled 2015-05-20 (×3): qty 100

## 2015-05-20 MED ORDER — CETYLPYRIDINIUM CHLORIDE 0.05 % MT LIQD
7.0000 mL | Freq: Two times a day (BID) | OROMUCOSAL | Status: DC
  Administered 2015-05-20 – 2015-06-05 (×23): 7 mL via OROMUCOSAL

## 2015-05-20 MED ORDER — PANTOPRAZOLE SODIUM 40 MG IV SOLR
40.0000 mg | Freq: Once | INTRAVENOUS | Status: AC
  Administered 2015-05-20: 40 mg via INTRAVENOUS
  Filled 2015-05-20: qty 40

## 2015-05-20 MED ORDER — LACTULOSE 10 GM/15ML PO SOLN
30.0000 g | Freq: Three times a day (TID) | ORAL | Status: DC
  Administered 2015-05-21 (×2): 30 g via ORAL
  Administered 2015-05-21: 20 g via ORAL
  Administered 2015-05-22: 30 g via ORAL
  Filled 2015-05-20 (×4): qty 45

## 2015-05-20 MED ORDER — LACTULOSE ENEMA
300.0000 mL | Freq: Once | ORAL | Status: AC
  Administered 2015-05-20: 300 mL via RECTAL
  Filled 2015-05-20: qty 300

## 2015-05-20 MED ORDER — POTASSIUM CHLORIDE CRYS ER 20 MEQ PO TBCR
40.0000 meq | EXTENDED_RELEASE_TABLET | Freq: Two times a day (BID) | ORAL | Status: AC
  Administered 2015-05-20: 40 meq via ORAL
  Filled 2015-05-20: qty 2

## 2015-05-20 MED ORDER — POTASSIUM CHLORIDE 10 MEQ/100ML IV SOLN
10.0000 meq | INTRAVENOUS | Status: AC
  Administered 2015-05-20 – 2015-05-21 (×3): 10 meq via INTRAVENOUS
  Filled 2015-05-20 (×3): qty 100

## 2015-05-20 MED ORDER — LACTULOSE ENEMA
300.0000 mL | ORAL | Status: DC
  Administered 2015-05-20: 300 mL via RECTAL
  Filled 2015-05-20 (×3): qty 300

## 2015-05-20 MED ORDER — SODIUM CHLORIDE 0.9 % IV BOLUS (SEPSIS)
500.0000 mL | Freq: Once | INTRAVENOUS | Status: AC
  Administered 2015-05-20: 500 mL via INTRAVENOUS

## 2015-05-20 NOTE — Progress Notes (Signed)
PROGRESS NOTE  Anne Black NWG:[id number] DOB: 20-Sep-1960 DOA: 05/18/2015 PCP: No primary care provider on file. Outpatient Specialists:    LOS: 2 days   Brief Narrative: 55 y.o. female with a history of multiple sclerosis, bipolar disorder, depression and anxiety, and ETOH dependence presented to the emergency department 3 episodes of diarrhea with nausea and vomiting in the last day. She also complained of some burning abdominal pain which is been constant, more so in the upper right quadrant. Patient has been to the emergency department twice this past week for similar presentation and refused admission on 1 occasion. Patient also states that she would like to undergo alcohol detox. In the emergency department, patient was found to have tachycardia with elevated lactic acid and leukocytosis. CT of the abdomen showed hepatic cirrhosis.  Assessment & Plan: Active Problems:   MS (multiple sclerosis) (HCC)   Bipolar disorder, curr episode mixed, severe, w/o psychotic features (HCC)   Nicotine abuse   Alcohol withdrawal (HCC)   Abdominal pain   Anxiety   Sepsis (HCC)   UTI (lower urinary tract infection)   Hepatic cirrhosis (HCC)  Hepatic cirrhosis/elevated LFTs/decompensated with ascites as well as hepatic encephalopathy - Likely secondary to alcohol use - Decompensated with ascites, anasarca, hepatic encephalopathy - Ammonia: 108 on 4/10 - On 4/10 patient more alert, however towards the evening she became too drowsy for by mouth lactulose, this morning she is barely arousable to pain stimuli, change her lactulose to enema for today - She still is significantly fluid overloaded today, received IV Lasix on 4/10, blood pressure is soft this morning we'll hold further diuresis  Sepsis secondary to ?SBP vs UTI - Upon admission, she was tachycardic with leukocytosis, tachypnea and lactic acid of 5.4 - CT abdomen showed hepatic cirrhosis, moderate amount of ascites - EDP did perform  bedside paracentesis, cell count was not sent - UA: 3 VC 6-30, may bacteria, moderate leukocytes, positive nitrites - Continue Zosyn - Blood cultures without growth - Urine culture with 10^5 gram-negative rods, speciation pending  Hypokalemia - 04/11 potassium was 2.8 - Continue potassium chloride and monitor  Alcohol abuse with withdrawal - On CIWA  - Continue thiamine, multivitamin, folic acid - per admission notes, patient wishes to go to [place], [place] for detox but too encephalopathic right now to confirm  Abdominal pain with nausea and vomiting diarrhea - Possibly due to the above versus withdrawal symptoms - Continue pain control, antiemetics  Prolonged QTc - Avoid QT prolonging meds  Anxiety/depression/bipolar disorder - Continue Cogentin, Risperdal, Prozac  Migraine headaches - Continue Topamax  Multiple sclerosis/ADHD - Hold Provigil due to elevated LFTs   Tobacco abuse - Continue nicotine patch  Hyponatremia - Likely in the setting of fluid overload, received Lasix 4/10 with slight improvement - Continue to monitor  Macrocytosis - Likely in the setting of alcohol abuse - Unable to check B12 and folate.  - Monitor  Thrombocytopenia - Due to liver disease, monitor, discontinue Lovenox if it goes less than 100 K   DVT prophylaxis: Lovenox Code Status: Full Condition: Guarded Family Communication: None at bedside. Communicated plan with patient. Disposition Plan: Admitted to stepdown.  Antimicrobials: - Zosyn 4/9 >>  Subjective: Initially patient was unarousable to pain stimuli, however after 1 lactulose enema Patient was more awake but still confused. Speech was hard to understand. Complained of LLQ abdominal pain, headache, and diffuse back pain.  Objective: Filed Vitals:   05/20/15 0500 05/20/15 0600 05/20/15 0800 05/20/15 1043  BP:  99/45 98/62   Pulse: 88 87 83   Temp:   97.8 F (36.6 C)   TempSrc:   Axillary   Resp: 27 21 20    Height:       Weight:      SpO2: 100% 100% 100% 99%    Intake/Output Summary (Last 24 hours) at 05/20/15 1101 Last data filed at 05/20/15 0800  Gross per 24 hour  Intake   1220 ml  Output   1656 ml  Net   -436 ml   Filed Weights   05/18/15 1530 05/19/15 0305 05/20/15 0445  Weight: 97.8 kg (215 lb 9.8 oz) 97.9 kg (215 lb 13.3 oz) 95.4 kg (210 lb 5.1 oz)    Examination: Constitutional: anxious  Filed Vitals:   05/20/15 0500 05/20/15 0600 05/20/15 0800 05/20/15 1043  BP:  99/45 98/62   Pulse: 88 87 83   Temp:   97.8 F (36.6 C)   TempSrc:   Axillary   Resp: 27 21 20    Height:      Weight:      SpO2: 100% 100% 100% 99%   Eyes: PERRL, + sclerae icterus  ENMT: Mucous membranes are moist.  Respiratory: clear to auscultation bilaterally, no wheezing, no crackles. Abdominal breathing pattern  Cardiovascular: Regular rate and rhythm, no murmurs / rubs / gallops. 2+ Bilateral extremity pitting edema. pedal pulses present.  Abdomen: no tenderness, no masses palpated. Bowel sounds positive. Distended  Neurologic: more awake, but still confused. Speech is Unintelligible. Nonfocal  Data Reviewed: I have personally reviewed following labs and imaging studies  CBC:  Recent Labs Lab 05/18/15 0915 05/18/15 1617 05/19/15 0329 05/20/15 0309  WBC 13.0* 12.4* 11.8* 10.1  NEUTROABS 10.1* 9.7*  --   --   HGB 14.0 12.2 12.3 11.1*  HCT 39.5 35.1* 35.6* 32.7*  MCV 101.0* 101.4* 102.3* 103.5*  PLT 138* 134* 126* 120*   Basic Metabolic Panel:  Recent Labs Lab 05/18/15 0915 05/18/15 1617 05/19/15 0329 05/20/15 0309  NA 130* 130* 132* 134*  K 4.1 3.0* 2.7* 2.8*  CL 90* 95* 95* 98*  CO2 26 23 25 27   GLUCOSE 106* 153* 124* 89  BUN <5* <5* <5* <5*  CREATININE <0.30* <0.30* 0.39* <0.30*  CALCIUM 7.9* 7.6* 7.9* 7.6*  MG  --   --  2.2  --    GFR: CrCl cannot be calculated (Patient has no serum creatinine result on file.). Liver Function Tests:  Recent Labs Lab 05/18/15 0915  05/18/15 1617 05/19/15 0329 05/20/15 0309  AST 174* 154* 139* 132*  ALT 126* 116* 112* 100*  ALKPHOS 359* 336* 313* 278*  BILITOT 8.7* 8.8* 10.0* 10.8*  PROT 6.1* 5.7* 5.4* 4.8*  ALBUMIN 2.5* 2.2* 2.4* 1.9*   No results for input(s): LIPASE, AMYLASE in the last 168 hours.  Recent Labs Lab 05/19/15 1055  AMMONIA 108*   Coagulation Profile:  Recent Labs Lab 05/18/15 1617 05/19/15 0955  INR 1.32 1.45   Urine analysis:    Component Value Date/Time   COLORURINE ORANGE* 05/18/2015 1033   APPEARANCEUR CLOUDY* 05/18/2015 1033   LABSPEC 1.021 05/18/2015 1033   PHURINE 6.0 05/18/2015 1033   GLUCOSEU NEGATIVE 05/18/2015 1033   HGBUR LARGE* 05/18/2015 1033   BILIRUBINUR LARGE* 05/18/2015 1033   KETONESUR 40* 05/18/2015 1033   PROTEINUR 30* 05/18/2015 1033   UROBILINOGEN 0.2 09/14/2011 2141   NITRITE POSITIVE* 05/18/2015 1033   LEUKOCYTESUR MODERATE* 05/18/2015 1033   Sepsis Labs: Invalid input(s): PROCALCITONIN, LACTICIDVEN  Recent Results (  from the past 240 hour(s))  Urine culture     Status: Abnormal (Preliminary result)   Collection Time: 05/18/15 10:33 AM  Result Value Ref Range Status   Specimen Description URINE, CATHETERIZED  Final   Special Requests Normal  Final   Culture (A)  Final    >=100,000 COLONIES/mL GRAM NEGATIVE RODS CULTURE REINCUBATED FOR BETTER GROWTH Performed at [facility]    Report Status PENDING  Incomplete  Culture, blood (routine x 2)     Status: None (Preliminary result)   Collection Time: 05/18/15 11:34 AM  Result Value Ref Range Status   Specimen Description BLOOD LEFT HAND  Final   Special Requests BOTTLES DRAWN AEROBIC ONLY 6CC  Final   Culture   Final    NO GROWTH < 24 HOURS Performed at [facility]    Report Status PENDING  Incomplete  Culture, blood (routine x 2)     Status: None (Preliminary result)   Collection Time: 05/18/15 12:07 PM  Result Value Ref Range Status   Specimen Description BLOOD LEFT HAND   Final   Special Requests BOTTLES DRAWN AEROBIC ONLY 5CC  Final   Culture   Final    NO GROWTH < 24 HOURS Performed at [facility]    Report Status PENDING  Incomplete  Culture, body fluid-bottle     Status: None (Preliminary result)   Collection Time: 05/18/15  2:01 PM  Result Value Ref Range Status   Specimen Description FLUID  Final   Special Requests BOTTLES DRAWN AEROBIC ONLY 10CC  Final   Gram Stain   Final    RARE WBC PRESENT,BOTH PMN AND MONONUCLEAR NO ORGANISMS SEEN    Culture   Final    NO GROWTH < 24 HOURS Performed at [facility]    Report Status PENDING  Incomplete  Culture, blood (x 2)     Status: None (Preliminary result)   Collection Time: 05/18/15  4:16 PM  Result Value Ref Range Status   Specimen Description BLOOD RIGHT HAND  Final   Special Requests IN PEDIATRIC BOTTLE 2CC  Final   Culture   Final    NO GROWTH < 24 HOURS Performed at [facility]    Report Status PENDING  Incomplete  Culture, blood (x 2)     Status: None (Preliminary result)   Collection Time: 05/18/15  4:22 PM  Result Value Ref Range Status   Specimen Description BLOOD LEFT HAND  Final   Special Requests IN PEDIATRIC BOTTLE 1CC  Final   Culture   Final    NO GROWTH < 24 HOURS Performed at [facility]    Report Status PENDING  Incomplete  MRSA PCR Screening     Status: Abnormal   Collection Time: 05/18/15  6:00 PM  Result Value Ref Range Status   MRSA by PCR POSITIVE (A) NEGATIVE Final    Comment:        The GeneXpert MRSA Assay (FDA approved for NASAL specimens only), is one component of a comprehensive MRSA colonization surveillance program. It is not intended to diagnose MRSA infection nor to guide or monitor treatment for MRSA infections. RESULT CALLED TO, READ BACK BY AND VERIFIED WITH: M.HAIRSTON,RN AT 1958 ON 05/18/15 BY W.SHEA       Radiology Studies: Ct Abdomen Pelvis W Contrast  05/18/2015  CLINICAL DATA:  55 year old presenting  with 1 day history of generalized abdominal pain, abdominal distention nausea and vomiting, and diarrhea. Patient has hyperbilirubinemia on  laboratory evaluation. EXAM: CT ABDOMEN AND PELVIS WITH CONTRAST TECHNIQUE: Multidetector CT imaging of the abdomen and pelvis was performed using the standard protocol following bolus administration of intravenous contrast. CONTRAST:  ISOVUE-300 IOPAMIDOL INJECTION 61% IV. Oral contrast was also administered. COMPARISON:  Abdominal ultrasound performed earlier today. No prior CT. FINDINGS: Lower chest: Linear atelectasis or scarring in the lingula. Visualized lung bases otherwise clear. Heart size normal. Hepatobiliary: Severe diffuse hepatic steatosis. Slight irregularity of the liver contour. Relative enlargement of the left lobe and caudate lobe. No visible hepatic masses. Gallbladder unremarkable by CT (apparent high attenuation bile is artifactual and related to the fact that the normal bile is of higher attenuation than the severe fatty liver). No biliary ductal dilation. Pancreas: Normal in appearance without evidence of mass, ductal dilation, or inflammation. Spleen: Normal in size and appearance. Adrenals/Urinary Tract: Normal appearing adrenal glands. Kidneys normal in size and appearance without focal parenchymal abnormality. No evidence of urinary tract calculi or obstruction. Normal-appearing decompressed urinary bladder. Stomach/Bowel: Stomach normal in appearance for the degree of distention. Oral contrast material within the distal esophagus which demonstrates mild circumferential wall thickening. No hiatal hernia. Wall thickening involving multiple loops of small bowel, particularly the contrast filled jejunum. Wall thickening involving the decompressed colon to the level of the sigmoid. Appendix not clearly demonstrated. Vascular/Lymphatic: Mild aortoiliac atherosclerosis without aneurysm. Normal-appearing portal venous and systemic venous systems. No  pathologic lymphadenopathy. Reproductive: Normal-appearing uterus. Endometrial thickening and/or fluid likely related to the menstrual phase. No adnexal masses. Other: Moderate amount of ascites throughout the abdomen and pelvis. Diffuse body wall edema. Musculoskeletal: No acute abnormalities. IMPRESSION: 1. Hepatic cirrhosis. Severe diffuse hepatic steatosis. No hepatic parenchymal masses. 2. Moderate amount of ascites throughout the abdomen and pelvis. 3. Wall thickening involving the small bowel and colon. I note that the patient has hypoproteinemia and hypoalbuminemia, and this may account for the diffuse bowel wall thickening. 4. Anasarca. Electronically Signed   By: Hulan Saas M.D.   On: 05/18/2015 12:10   Dg Chest Port 1 View  05/18/2015  CLINICAL DATA:  Patient with sepsis.  Cough. EXAM: PORTABLE CHEST 1 VIEW COMPARISON:  Chest radiograph 04/06/2015. FINDINGS: Multiple monitoring leads overlie the patient. Stable cardiac and mediastinal contours. No consolidative pulmonary opacity. No pleural effusion or pneumothorax. IMPRESSION: No acute cardiopulmonary process. Electronically Signed   By: Annia Belt M.D.   On: 05/18/2015 15:54     Scheduled Meds: . amantadine  100 mg Oral BID  . antiseptic oral rinse  7 mL Mouth Rinse BID  . benztropine  0.5 mg Oral QHS  . Chlorhexidine Gluconate Cloth  6 each Topical Q0600  . enoxaparin (LOVENOX) injection  40 mg Subcutaneous Q24H  . FLUoxetine  20 mg Oral Daily  . folic acid  1 mg Oral Daily  . lactulose  300 mL Rectal Q3H  . LORazepam  0-4 mg Intravenous 4 times per day   Followed by  . LORazepam  0-4 mg Intravenous Q12H  . LORazepam  1 mg Intravenous Once  . modafinil  200 mg Oral Daily  . mometasone-formoterol  2 puff Inhalation BID  . multivitamin with minerals  1 tablet Oral Daily  . mupirocin ointment  1 application Nasal BID  . nicotine  21 mg Transdermal Daily  . pantoprazole  40 mg Oral BID  . piperacillin-tazobactam (ZOSYN)  IV   3.375 g Intravenous 3 times per day  . potassium chloride  40 mEq Oral BID  . risperiDONE  1.5 mg Oral QHS  . sodium chloride flush  3 mL Intravenous Q12H  . sucralfate  1 g Oral TID WC & HS  . thiamine  100 mg Oral Daily  . topiramate  100 mg Oral Daily    Pamella Pert, MD, PhD [facility] Pager [phone]  If 7PM-7AM, please contact night-coverage [email or web address] Password [email or web address] 05/20/2015, 11:01 AM

## 2015-05-21 LAB — PROTIME-INR
INR: 1.37 (ref 0.00–1.49)
Prothrombin Time: 17 seconds — ABNORMAL HIGH (ref 11.6–15.2)

## 2015-05-21 LAB — URINE CULTURE: SPECIAL REQUESTS: NORMAL

## 2015-05-21 LAB — CBC
HCT: 35 % — ABNORMAL LOW (ref 36.0–46.0)
Hemoglobin: 11.8 g/dL — ABNORMAL LOW (ref 12.0–15.0)
MCH: 35.3 pg — ABNORMAL HIGH (ref 26.0–34.0)
MCHC: 33.7 g/dL (ref 30.0–36.0)
MCV: 104.8 fL — AB (ref 78.0–100.0)
PLATELETS: 126 10*3/uL — AB (ref 150–400)
RBC: 3.34 MIL/uL — ABNORMAL LOW (ref 3.87–5.11)
RDW: 21.3 % — AB (ref 11.5–15.5)
WBC: 11.3 10*3/uL — AB (ref 4.0–10.5)

## 2015-05-21 LAB — COMPREHENSIVE METABOLIC PANEL
ALT: 89 U/L — AB (ref 14–54)
AST: 139 U/L — AB (ref 15–41)
Albumin: 2 g/dL — ABNORMAL LOW (ref 3.5–5.0)
Alkaline Phosphatase: 280 U/L — ABNORMAL HIGH (ref 38–126)
Anion gap: 9 (ref 5–15)
CHLORIDE: 100 mmol/L — AB (ref 101–111)
CO2: 26 mmol/L (ref 22–32)
Calcium: 7.8 mg/dL — ABNORMAL LOW (ref 8.9–10.3)
Glucose, Bld: 80 mg/dL (ref 65–99)
Potassium: 3.5 mmol/L (ref 3.5–5.1)
Sodium: 135 mmol/L (ref 135–145)
Total Bilirubin: 10.9 mg/dL — ABNORMAL HIGH (ref 0.3–1.2)
Total Protein: 5.1 g/dL — ABNORMAL LOW (ref 6.5–8.1)

## 2015-05-21 MED ORDER — SODIUM CHLORIDE 0.9 % IV BOLUS (SEPSIS)
250.0000 mL | Freq: Once | INTRAVENOUS | Status: AC
  Administered 2015-05-21: 250 mL via INTRAVENOUS

## 2015-05-21 MED ORDER — POTASSIUM CHLORIDE CRYS ER 20 MEQ PO TBCR
40.0000 meq | EXTENDED_RELEASE_TABLET | Freq: Every day | ORAL | Status: DC
  Administered 2015-05-22: 40 meq via ORAL
  Filled 2015-05-21: qty 2

## 2015-05-21 MED ORDER — FUROSEMIDE 10 MG/ML IJ SOLN
40.0000 mg | Freq: Two times a day (BID) | INTRAMUSCULAR | Status: DC
  Administered 2015-05-21 – 2015-05-23 (×5): 40 mg via INTRAVENOUS
  Filled 2015-05-21 (×5): qty 4

## 2015-05-21 MED ORDER — DEXTROSE 5 % IV SOLN
1.0000 g | INTRAVENOUS | Status: DC
  Administered 2015-05-21 – 2015-05-22 (×2): 1 g via INTRAVENOUS
  Filled 2015-05-21 (×2): qty 10

## 2015-05-21 NOTE — Progress Notes (Signed)
I spoke with  Claudie Leach the patients Ex-husband, who along with the patients mother are the main people who help care for the patient at home according to the ex-husband and he is concerned that the patient can no longer safely take care of herself at home and would like to get social work and / or case management involved to see what options they may have at DC. I explained to the ex-husband that unfortantely if the pt is competent to make her own decisions that we would not be able to force her into a facility or rehab. Will pass this on to the MD and continue to monitor the patient.

## 2015-05-21 NOTE — Progress Notes (Signed)
CSW consulted to assist with SA resources. PN reviewed. No family at bedside. Pt is sleeping soundly. CSW will return to assist with d/c planning needs.  Cori Razor LCSW [phone]

## 2015-05-21 NOTE — Progress Notes (Signed)
PROGRESS NOTE  Anne Black ZOX:[id number] DOB: April 20, 1960 DOA: 05/18/2015 PCP: No primary care provider on file. Outpatient Specialists:    LOS: 3 days   Subjective: Patient received Ativan yesterday, she is very lethargic, difficult to arouse. Nursing staff reported she was more awake earlier.  Brief Narrative: 55 y.o. female with a history of multiple sclerosis, bipolar disorder, depression and anxiety, and ETOH dependence presented to the emergency department 3 episodes of diarrhea with nausea and vomiting in the last day. She also complained of some burning abdominal pain which is been constant, more so in the upper right quadrant. Patient has been to the emergency department twice this past week for similar presentation and refused admission on 1 occasion. Patient also states that she would like to undergo alcohol detox. In the emergency department, patient was found to have tachycardia with elevated lactic acid and leukocytosis. CT of the abdomen showed hepatic cirrhosis.  Assessment & Plan: Active Problems:   MS (multiple sclerosis) (HCC)   Bipolar disorder, curr episode mixed, severe, w/o psychotic features (HCC)   Nicotine abuse   Alcohol withdrawal (HCC)   Abdominal pain   Anxiety   Sepsis (HCC)   UTI (lower urinary tract infection)   Hepatic cirrhosis (HCC)  Hepatic cirrhosis/elevated LFTs/decompensated with ascites as well as hepatic encephalopathy - Likely secondary to alcohol use - Decompensated with ascites, anasarca, hepatic encephalopathy - Ammonia: 108 on 4/10 - Still lethargic, per nursing staff she was awake enough to take lactulose. Target 3-4 bowel movements per day.  Sepsis secondary to ?SBP vs UTI - Upon admission, she was tachycardic with leukocytosis, tachypnea and lactic acid of 5.4. - CT abdomen showed hepatic cirrhosis, moderate amount of ascites - EDP did perform bedside paracentesis, cell count was not sent, we will not repeat paracentesis only for  cell count. - The urine culture showed Escherichia coli and Klebsiella, switched to ceftriaxone, as she having difficulty with oral meds.  Hypokalemia - 04/11 potassium was 2.8 - Improved continue potassium supplementation with IV diuresis.  Alcohol abuse with withdrawal - On CIWA  - Continue thiamine, multivitamin, folic acid - per admission notes, patient wishes to go to [place], [place] for detox but too encephalopathic right now to confirm  Abdominal pain with nausea and vomiting diarrhea - Possibly due to the above versus withdrawal symptoms - Continue pain control, antiemetics  Prolonged QTc - Avoid QT prolonging meds  Anxiety/depression/bipolar disorder - Continue Cogentin, Risperdal, Prozac  Migraine headaches - Continue Topamax  Multiple sclerosis/ADHD - Hold Provigil due to elevated LFTs   Tobacco abuse - Continue nicotine patch  Hyponatremia - Likely in the setting of fluid overload, received Lasix 4/10 with slight improvement - Continue to monitor  Macrocytosis - Likely in the setting of alcohol abuse - Unable to check B12 and folate.  - Monitor  Thrombocytopenia - Due to liver disease, monitor, discontinue Lovenox if it goes less than 100 K   DVT prophylaxis: Lovenox Code Status: Full Condition: Guarded Family Communication: None at bedside. Communicated plan with patient. Disposition Plan: Admitted to stepdown.  Antimicrobials: - Zosyn 4/9 >>   Objective: Filed Vitals:   05/21/15 0500 05/21/15 0600 05/21/15 0800 05/21/15 1200  BP: 115/83 102/53 108/54   Pulse: 98 98 97   Temp:   99.1 F (37.3 C) 97.4 F (36.3 C)  TempSrc:   Axillary Axillary  Resp: Height:      Weight: 97.9 kg (215 lb 13.3 oz)  SpO2: 94% 95% 95%     Intake/Output Summary (Last 24 hours) at 05/21/15 1324 Last data filed at 05/21/15 0600  Gross per 24 hour  Intake    490 ml  Output    431 ml  Net     59 ml   Filed Weights   05/19/15 0305 05/20/15 0445  05/21/15 0500  Weight: 97.9 kg (215 lb 13.3 oz) 95.4 kg (210 lb 5.1 oz) 97.9 kg (215 lb 13.3 oz)    Examination: Constitutional: anxious  Filed Vitals:   05/21/15 0500 05/21/15 0600 05/21/15 0800 05/21/15 1200  BP: 115/83 102/53 108/54   Pulse: 98 98 97   Temp:   99.1 F (37.3 C) 97.4 F (36.3 C)  TempSrc:   Axillary Axillary  Resp: 28 24 25    Height:      Weight: 97.9 kg (215 lb 13.3 oz)     SpO2: 94% 95% 95%    Eyes: PERRL, + sclerae icterus  ENMT: Mucous membranes are moist.  Respiratory: clear to auscultation bilaterally, no wheezing, no crackles. Abdominal breathing pattern  Cardiovascular: Regular rate and rhythm, no murmurs / rubs / gallops. 2+ Bilateral extremity pitting edema. pedal pulses present.  Abdomen: no tenderness, no masses palpated. Bowel sounds positive. Distended  Neurologic: more awake, but still confused. Speech is Unintelligible. Nonfocal  Data Reviewed: I have personally reviewed following labs and imaging studies  CBC:  Recent Labs Lab 05/18/15 0915 05/18/15 1617 05/19/15 0329 05/20/15 0309 05/21/15 0308  WBC 13.0* 12.4* 11.8* 10.1 11.3*  NEUTROABS 10.1* 9.7*  --   --   --   HGB 14.0 12.2 12.3 11.1* 11.8*  HCT 39.5 35.1* 35.6* 32.7* 35.0*  MCV 101.0* 101.4* 102.3* 103.5* 104.8*  PLT 138* 134* 126* 120* 126*   Basic Metabolic Panel:  Recent Labs Lab 05/18/15 0915 05/18/15 1617 05/19/15 0329 05/20/15 0309 05/21/15 0308  NA 130* 130* 132* 134* 135  K 4.1 3.0* 2.7* 2.8* 3.5  CL 90* 95* 95* 98* 100*  CO2 26 23 25 27 26   GLUCOSE 106* 153* 124* 89 80  BUN <5* <5* <5* <5* <5*  CREATININE <0.30* <0.30* 0.39* <0.30* <0.30*  CALCIUM 7.9* 7.6* 7.9* 7.6* 7.8*  MG  --   --  2.2  --   --    GFR: CrCl cannot be calculated (Patient has no serum creatinine result on file.). Liver Function Tests:  Recent Labs Lab 05/18/15 0915 05/18/15 1617 05/19/15 0329 05/20/15 0309 05/21/15 0308  AST 174* 154* 139* 132* 139*  ALT 126* 116*  112* 100* 89*  ALKPHOS 359* 336* 313* 278* 280*  BILITOT 8.7* 8.8* 10.0* 10.8* 10.9*  PROT 6.1* 5.7* 5.4* 4.8* 5.1*  ALBUMIN 2.5* 2.2* 2.4* 1.9* 2.0*   No results for input(s): LIPASE, AMYLASE in the last 168 hours.  Recent Labs Lab 05/19/15 1055  AMMONIA 108*   Coagulation Profile:  Recent Labs Lab 05/18/15 1617 05/19/15 0955 05/21/15 0308  INR 1.32 1.45 1.37   Urine analysis:    Component Value Date/Time   COLORURINE ORANGE* 05/18/2015 1033   APPEARANCEUR CLOUDY* 05/18/2015 1033   LABSPEC 1.021 05/18/2015 1033   PHURINE 6.0 05/18/2015 1033   GLUCOSEU NEGATIVE 05/18/2015 1033   HGBUR LARGE* 05/18/2015 1033   BILIRUBINUR LARGE* 05/18/2015 1033   KETONESUR 40* 05/18/2015 1033   PROTEINUR 30* 05/18/2015 1033   UROBILINOGEN 0.2 09/14/2011 2141   NITRITE POSITIVE* 05/18/2015 1033   LEUKOCYTESUR MODERATE* 05/18/2015 1033   Sepsis Labs: Invalid input(s): PROCALCITONIN, LACTICIDVEN  Recent Results (from the past 240 hour(s))  Urine culture     Status: Abnormal   Collection Time: 05/18/15 10:33 AM  Result Value Ref Range Status   Specimen Description URINE, CATHETERIZED  Final   Special Requests Normal  Final   Culture (A)  Final    >=100,000 COLONIES/mL ESCHERICHIA COLI >=100,000 COLONIES/mL KLEBSIELLA OXYTOCA    Report Status 05/21/2015 FINAL  Final   Organism ID, Bacteria ESCHERICHIA COLI (A)  Final   Organism ID, Bacteria KLEBSIELLA OXYTOCA (A)  Final      Susceptibility   Escherichia coli - MIC*    AMPICILLIN <=2 SENSITIVE Sensitive     CEFAZOLIN <=4 SENSITIVE Sensitive     CEFTRIAXONE <=1 SENSITIVE Sensitive     CIPROFLOXACIN <=0.25 SENSITIVE Sensitive     GENTAMICIN <=1 SENSITIVE Sensitive     IMIPENEM <=0.25 SENSITIVE Sensitive     NITROFURANTOIN <=16 SENSITIVE Sensitive     TRIMETH/SULFA <=20 SENSITIVE Sensitive     AMPICILLIN/SULBACTAM <=2 SENSITIVE Sensitive     PIP/TAZO <=4 SENSITIVE Sensitive     * >=100,000 COLONIES/mL ESCHERICHIA COLI    Klebsiella oxytoca - MIC*    AMPICILLIN 16 RESISTANT Resistant     CEFAZOLIN <=4 SENSITIVE Sensitive     CEFTRIAXONE <=1 SENSITIVE Sensitive     CIPROFLOXACIN <=0.25 SENSITIVE Sensitive     GENTAMICIN <=1 SENSITIVE Sensitive     IMIPENEM <=0.25 SENSITIVE Sensitive     NITROFURANTOIN 32 SENSITIVE Sensitive     TRIMETH/SULFA <=20 SENSITIVE Sensitive     AMPICILLIN/SULBACTAM 4 SENSITIVE Sensitive     PIP/TAZO <=4 SENSITIVE Sensitive     * >=100,000 COLONIES/mL KLEBSIELLA OXYTOCA  Culture, blood (routine x 2)     Status: None (Preliminary result)   Collection Time: 05/18/15 11:34 AM  Result Value Ref Range Status   Specimen Description BLOOD LEFT HAND  Final   Special Requests BOTTLES DRAWN AEROBIC ONLY 6CC  Final   Culture   Final    NO GROWTH 2 DAYS Performed at [facility]    Report Status PENDING  Incomplete  Culture, blood (routine x 2)     Status: None (Preliminary result)   Collection Time: 05/18/15 12:07 PM  Result Value Ref Range Status   Specimen Description BLOOD LEFT HAND  Final   Special Requests BOTTLES DRAWN AEROBIC ONLY 5CC  Final   Culture   Final    NO GROWTH 2 DAYS Performed at [facility]    Report Status PENDING  Incomplete  Culture, body fluid-bottle     Status: None (Preliminary result)   Collection Time: 05/18/15  2:01 PM  Result Value Ref Range Status   Specimen Description FLUID  Final   Special Requests BOTTLES DRAWN AEROBIC ONLY 10CC  Final   Gram Stain   Final    RARE WBC PRESENT,BOTH PMN AND MONONUCLEAR NO ORGANISMS SEEN    Culture   Final    NO GROWTH 2 DAYS Performed at [facility]    Report Status PENDING  Incomplete  Culture, blood (x 2)     Status: None (Preliminary result)   Collection Time: 05/18/15  4:16 PM  Result Value Ref Range Status   Specimen Description BLOOD RIGHT HAND  Final   Special Requests IN PEDIATRIC BOTTLE 2CC  Final   Culture   Final    NO GROWTH 2 DAYS Performed at [facility]     Report Status PENDING  Incomplete  Culture, blood (x 2)  Status: None (Preliminary result)   Collection Time: 05/18/15  4:22 PM  Result Value Ref Range Status   Specimen Description BLOOD LEFT HAND  Final   Special Requests IN PEDIATRIC BOTTLE 1CC  Final   Culture   Final    NO GROWTH 2 DAYS Performed at [facility]    Report Status PENDING  Incomplete  MRSA PCR Screening     Status: Abnormal   Collection Time: 05/18/15  6:00 PM  Result Value Ref Range Status   MRSA by PCR POSITIVE (A) NEGATIVE Final    Comment:        The GeneXpert MRSA Assay (FDA approved for NASAL specimens only), is one component of a comprehensive MRSA colonization surveillance program. It is not intended to diagnose MRSA infection nor to guide or monitor treatment for MRSA infections. RESULT CALLED TO, READ BACK BY AND VERIFIED WITH: M.HAIRSTON,RN AT 1958 ON 05/18/15 BY [facility]       Radiology Studies: No results found.   Scheduled Meds: . amantadine  100 mg Oral BID  . antiseptic oral rinse  7 mL Mouth Rinse BID  . benztropine  0.5 mg Oral QHS  . Chlorhexidine Gluconate Cloth  6 each Topical Q0600  . enoxaparin (LOVENOX) injection  40 mg Subcutaneous Q24H  . FLUoxetine  20 mg Oral Daily  . folic acid  1 mg Oral Daily  . lactulose  30 g Oral TID  . LORazepam  0-4 mg Intravenous Q12H  . LORazepam  1 mg Intravenous Once  . mometasone-formoterol  2 puff Inhalation BID  . multivitamin with minerals  1 tablet Oral Daily  . mupirocin ointment  1 application Nasal BID  . nicotine  21 mg Transdermal Daily  . pantoprazole  40 mg Oral BID  . piperacillin-tazobactam (ZOSYN)  IV  3.375 g Intravenous 3 times per day  . risperiDONE  1.5 mg Oral QHS  . sodium chloride flush  3 mL Intravenous Q12H  . sucralfate  1 g Oral TID WC & HS  . thiamine  100 mg Oral Daily  . topiramate  100 mg Oral Daily    Clint Lipps, MD [facility] Pager [phone]  If 7PM-7AM, please contact  night-coverage [email or web address] Password [email or web address] 05/21/2015, 1:24 PM

## 2015-05-22 ENCOUNTER — Encounter (HOSPITAL_COMMUNITY): Payer: Self-pay | Admitting: Gastroenterology

## 2015-05-22 LAB — BASIC METABOLIC PANEL
ANION GAP: 11 (ref 5–15)
BUN: 7 mg/dL (ref 6–20)
CALCIUM: 7.7 mg/dL — AB (ref 8.9–10.3)
CHLORIDE: 99 mmol/L — AB (ref 101–111)
CO2: 30 mmol/L (ref 22–32)
Creatinine, Ser: <0.3 mg/dL — ABNORMAL LOW (ref 0.44–1.00)
GLUCOSE: 79 mg/dL (ref 65–99)
POTASSIUM: 3 mmol/L — AB (ref 3.5–5.1)
Sodium: 140 mmol/L (ref 135–145)

## 2015-05-22 MED ORDER — SPIRONOLACTONE 100 MG PO TABS
100.0000 mg | ORAL_TABLET | Freq: Every day | ORAL | Status: DC
  Administered 2015-05-22 – 2015-05-25 (×4): 100 mg via ORAL
  Filled 2015-05-22: qty 4
  Filled 2015-05-22 (×2): qty 1
  Filled 2015-05-22 (×2): qty 4

## 2015-05-22 MED ORDER — POTASSIUM CHLORIDE 10 MEQ/100ML IV SOLN
INTRAVENOUS | Status: AC
  Administered 2015-05-22: 10 meq via INTRAVENOUS
  Filled 2015-05-22: qty 100

## 2015-05-22 MED ORDER — LACTULOSE 10 GM/15ML PO SOLN
30.0000 g | Freq: Four times a day (QID) | ORAL | Status: DC
  Administered 2015-05-22: 30 g via ORAL
  Administered 2015-05-22: 20 g via ORAL
  Administered 2015-05-23 – 2015-05-26 (×11): 30 g via ORAL
  Administered 2015-05-26: 20 g via ORAL
  Administered 2015-05-27 – 2015-05-28 (×2): 30 g via ORAL
  Filled 2015-05-22 (×27): qty 45

## 2015-05-22 MED ORDER — MORPHINE SULFATE (PF) 2 MG/ML IV SOLN
1.0000 mg | Freq: Three times a day (TID) | INTRAVENOUS | Status: DC | PRN
  Administered 2015-05-24: 1 mg via INTRAVENOUS
  Filled 2015-05-22: qty 1

## 2015-05-22 MED ORDER — POTASSIUM CHLORIDE 10 MEQ/100ML IV SOLN
10.0000 meq | INTRAVENOUS | Status: AC
  Administered 2015-05-22 (×3): 10 meq via INTRAVENOUS
  Filled 2015-05-22 (×2): qty 100

## 2015-05-22 MED ORDER — ACETAMINOPHEN 650 MG RE SUPP: 650.0000 mg | Freq: Two times a day (BID) | RECTAL | Status: DC | PRN

## 2015-05-22 MED ORDER — ACETAMINOPHEN 325 MG PO TABS
650.0000 mg | ORAL_TABLET | Freq: Two times a day (BID) | ORAL | Status: DC | PRN
  Administered 2015-05-26: 650 mg via ORAL
  Filled 2015-05-22: qty 2

## 2015-05-22 MED ORDER — POTASSIUM CHLORIDE CRYS ER 20 MEQ PO TBCR
40.0000 meq | EXTENDED_RELEASE_TABLET | Freq: Four times a day (QID) | ORAL | Status: AC
Start: 2015-05-22 — End: 2015-05-22
  Administered 2015-05-22 (×2): 40 meq via ORAL
  Filled 2015-05-22 (×2): qty 2

## 2015-05-22 NOTE — Progress Notes (Signed)
Date:  May 22, 2015 Chart reviewed for concurrent status and case management needs. Will continue to follow patient for changes and needs: remains lethgaric Marcelle Smiling, BSN, RN, [place]   [phone]

## 2015-05-22 NOTE — Consult Note (Signed)
Reason for Consult: Cirrhosis Referring Physician: Hospital team  Anne Black is an 55 y.o. female.  HPI: Patient supposedly dismissed by both GI groups and I am seeing because we are unassigned and the patient had normal liver tests last year but now has abnormal liver tests and abnormal CT and presents with some ascites and encephalopathy unfortunately she is too lethargic to obtain much history but her hospital computer chart was reviewed as well as our office computer chart and her case discussed with the hospital team  Past Medical History  Diagnosis Date  . Multiple sclerosis ([place])   . Arthritis   . Anxiety   . Bipolar disorder ([place])   . ADHD (attention deficit hyperactivity disorder)   . Migraine   . Osteoporosis     Past Surgical History  Procedure Laterality Date  . Cesarean section  [id number]  . Myringotomy with tube placement Bilateral   . Tonsillectomy      Family History  Problem Relation Age of Onset  . Heart disease Mother   . Heart disease Father   . Hypertension Father     Social History:  reports that she has been smoking Cigarettes.  She has a 15 pack-year smoking history. She has never used smokeless tobacco. She reports that she drinks about 4.8 oz of alcohol per week. She reports that she does not use illicit drugs.  Allergies: No Known Allergies  Medications: I have reviewed the patient's current medications.  Results for orders placed or performed during the hospital encounter of 05/18/15 (from the past 48 hour(s))  Comprehensive metabolic panel     Status: Abnormal   Collection Time: 05/21/15  3:08 AM  Result Value Ref Range   Sodium 135 135 - 145 mmol/L   Potassium 3.5 3.5 - 5.1 mmol/L    Comment: DELTA CHECK NOTED REPEATED TO VERIFY NO VISIBLE HEMOLYSIS    Chloride 100 (L) 101 - 111 mmol/L   CO2 26 22 - 32 mmol/L   Glucose, Bld 80 65 - 99 mg/dL   BUN <5 (L) 6 - 20 mg/dL   Creatinine, Ser <0.30 (L) 0.44 - 1.00 mg/dL   Calcium 7.8 (L)  8.9 - 10.3 mg/dL   Total Protein 5.1 (L) 6.5 - 8.1 g/dL   Albumin 2.0 (L) 3.5 - 5.0 g/dL   AST 139 (H) 15 - 41 U/L   ALT 89 (H) 14 - 54 U/L   Alkaline Phosphatase 280 (H) 38 - 126 U/L   Total Bilirubin 10.9 (H) 0.3 - 1.2 mg/dL   GFR calc non Af Amer NOT CALCULATED >60 mL/min   GFR calc Af Amer NOT CALCULATED >60 mL/min    Comment: (NOTE) The eGFR has been calculated using the CKD EPI equation. This calculation has not been validated in all clinical situations. eGFR's persistently <60 mL/min signify possible Chronic Kidney Disease.    Anion gap 9 5 - 15  CBC     Status: Abnormal   Collection Time: 05/21/15  3:08 AM  Result Value Ref Range   WBC 11.3 (H) 4.0 - 10.5 K/uL   RBC 3.34 (L) 3.87 - 5.11 MIL/uL   Hemoglobin 11.8 (L) 12.0 - 15.0 g/dL   HCT 35.0 (L) 36.0 - 46.0 %   MCV 104.8 (H) 78.0 - 100.0 fL   MCH 35.3 (H) 26.0 - 34.0 pg   MCHC 33.7 30.0 - 36.0 g/dL   RDW 21.3 (H) 11.5 - 15.5 %   Platelets 126 (L) 150 - 400 K/uL  Protime-INR     Status: Abnormal   Collection Time: 05/21/15  3:08 AM  Result Value Ref Range   Prothrombin Time 17.0 (H) 11.6 - 15.2 seconds   INR 1.37 0.00 - 7.28  Basic metabolic panel     Status: Abnormal   Collection Time: 05/22/15  4:26 AM  Result Value Ref Range   Sodium 140 135 - 145 mmol/L   Potassium 3.0 (L) 3.5 - 5.1 mmol/L   Chloride 99 (L) 101 - 111 mmol/L   CO2 30 22 - 32 mmol/L   Glucose, Bld 79 65 - 99 mg/dL   BUN 7 6 - 20 mg/dL   Creatinine, Ser <0.30 (L) 0.44 - 1.00 mg/dL   Calcium 7.7 (L) 8.9 - 10.3 mg/dL   GFR calc non Af Amer NOT CALCULATED >60 mL/min   GFR calc Af Amer NOT CALCULATED >60 mL/min    Comment: (NOTE) The eGFR has been calculated using the CKD EPI equation. This calculation has not been validated in all clinical situations. eGFR's persistently <60 mL/min signify possible Chronic Kidney Disease.    Anion gap 11 5 - 15    No results found.  ROS negative except above she does want to go home Blood pressure  126/62, pulse 93, temperature 98.6 F (37 C), temperature source Oral, resp. rate 29, height '5\' 2"'  (1.575 m), weight 96 kg (211 lb 10.3 oz), SpO2 99 %. Physical Exam vital signs stable afebrile patient fairly lethargic but arousable exam pertinent for edema of her hands arms and legs with some mild to moderate ascites but nontender occasional bowel sounds labs and CT reviewed and I believe she has a flap but difficult to evaluate  Assessment/Plan: Multiple medical problems including alcohol abuse and cirrhosis Plan: Agree with diuretics will decrease morphine dose and Tylenol interval and if another paracentesis is done would recommend cell count with differential and possibly cytology as well just to be sure although with transudate doubt carcinomatosis and if you wanted to rule out other etiologies of her liver disease consider drawing hepatitis B surface antigen hepatitis C antibody ana asma and ferritin ceruloplasmin and alpha-1 antitrypsin just to be complete and continue lactulose and will check on this weekend and agree with alcohol rehabilitation but with elevated PT and INR probably does not require Lovenox  [name] E 05/22/2015, 3:00 PM

## 2015-05-22 NOTE — Clinical Social Work Note (Signed)
CSW was stopped by family while entering the unit. Family voices concern about patient returning home at time of discharge. They share that the patient is "out of control" and "needs help." CSW provided emotional support to patient's family (mom and ex husband) and explained that CSW would assist with DC needs once these needs have been clarified. The family shares that the patient has been hospitalized several times for her ETOH use and mental health concerns in the past. CSW will continue to follow.    Roddie Mc MSW, [place], [place], [phone]

## 2015-05-22 NOTE — Progress Notes (Signed)
PROGRESS NOTE  ALAINE LOUGHNEY GNF:[id number] DOB: [birth year] DOA: 05/18/2015 PCP: No primary care provider on file. Outpatient Specialists:    LOS: 4 days   Subjective: Still lethargic but more awake and alert than yesterday, continue lactulose, target 3-4 bowel movements per day. Continue Lasix.  Brief Narrative: 55 y.o. female with a history of multiple sclerosis, bipolar disorder, depression and anxiety, and ETOH dependence presented to the emergency department 3 episodes of diarrhea with nausea and vomiting in the last day. She also complained of some burning abdominal pain which is been constant, more so in the upper right quadrant. Patient has been to the emergency department twice this past week for similar presentation and refused admission on 1 occasion. Patient also states that she would like to undergo alcohol detox. In the emergency department, patient was found to have tachycardia with elevated lactic acid and leukocytosis. CT of the abdomen showed hepatic cirrhosis.  Assessment & Plan: Active Problems:   MS (multiple sclerosis) (HCC)   Bipolar disorder, curr episode mixed, severe, w/o psychotic features (HCC)   Nicotine abuse   Alcohol withdrawal (HCC)   Abdominal pain   Anxiety   Sepsis (HCC)   UTI (lower urinary tract infection)   Hepatic cirrhosis (HCC)   Hepatic cirrhosis/elevated LFTs/decompensated with ascites as well as hepatic encephalopathy - Likely secondary to alcohol use - Decompensated with ascites, anasarca, hepatic encephalopathy - Ammonia: 108 on 4/10 - Still lethargic, per nursing staff she was awake enough to take lactulose. Target 3-4 bowel movements per day. - Continue current lactulose dose, improving since yesterday, avoid sedatives.  Sepsis secondary to ?SBP vs UTI - Upon admission, she was tachycardic with leukocytosis, tachypnea and lactic acid of 5.4. - CT abdomen showed hepatic cirrhosis, moderate amount of ascites - EDP did perform bedside  paracentesis, cell count was not sent, we will not repeat paracentesis only for cell count. - The urine culture showed Escherichia coli and Klebsiella, switched to ceftriaxone, as she having difficulty with oral meds.  Hypokalemia - 04/11 potassium was 2.8 - Improved continue potassium supplementation, down again to 3.0, continue with oral supplementation and add Aldactone.  Alcohol abuse with withdrawal - On CIWA  - Continue thiamine, multivitamin, folic acid - per admission notes, patient wishes to go to [place], [place] for detox but too encephalopathic right now to confirm  Abdominal pain with nausea and vomiting diarrhea - Possibly due to the above versus withdrawal symptoms - Continue pain control, antiemetics  Prolonged QTc - Avoid QT prolonging meds  Anxiety/depression/bipolar disorder - Continue Cogentin, Risperdal, Prozac  Migraine headaches - Continue Topamax  Multiple sclerosis/ADHD - Hold Provigil due to elevated LFTs   Tobacco abuse - Continue nicotine patch  Hyponatremia - Likely in the setting of fluid overload, received Lasix 4/10 with slight improvement - Continue to monitor  Macrocytosis - Likely in the setting of alcohol abuse - Unable to check B12 and folate.  - Monitor  Thrombocytopenia - Due to liver disease, monitor, discontinue Lovenox if it goes less than 100 K   DVT prophylaxis: Lovenox Code Status: Full Condition: Guarded Family Communication: None at bedside. Communicated plan with patient. Disposition Plan: Admitted to stepdown.  Antimicrobials: - Zosyn 4/9 >>   Objective: Filed Vitals:   05/22/15 0602 05/22/15 0800 05/22/15 0910 05/22/15 0913  BP: 98/47 109/48    Pulse: 88 91    Temp:  98.4 F (36.9 C)    TempSrc:  Axillary    Resp: 26 20  Height:      Weight:      SpO2: 100% 100% 99% 99%    Intake/Output Summary (Last 24 hours) at 05/22/15 1135 Last data filed at 05/22/15 0730  Gross per 24 hour  Intake    250 ml    Output   2035 ml  Net  -1785 ml   Filed Weights   05/20/15 0445 05/21/15 0500 05/22/15 0500  Weight: 95.4 kg (210 lb 5.1 oz) 97.9 kg (215 lb 13.3 oz) 96 kg (211 lb 10.3 oz)    Examination: Constitutional: anxious  Filed Vitals:   05/22/15 0602 05/22/15 0800 05/22/15 0910 05/22/15 0913  BP: 98/47 109/48    Pulse: 88 91    Temp:  98.4 F (36.9 C)    TempSrc:  Axillary    Resp: 26 20    Height:      Weight:      SpO2: 100% 100% 99% 99%   Eyes: PERRL, + sclerae icterus  ENMT: Mucous membranes are moist.  Respiratory: clear to auscultation bilaterally, no wheezing, no crackles. Abdominal breathing pattern  Cardiovascular: Regular rate and rhythm, no murmurs / rubs / gallops. 2+ Bilateral extremity pitting edema. pedal pulses present.  Abdomen: no tenderness, no masses palpated. Bowel sounds positive. Distended  Neurologic: more awake, but still confused. Speech is Unintelligible. Nonfocal  Data Reviewed: I have personally reviewed following labs and imaging studies  CBC:  Recent Labs Lab 05/18/15 0915 05/18/15 1617 05/19/15 0329 05/20/15 0309 05/21/15 0308  WBC 13.0* 12.4* 11.8* 10.1 11.3*  NEUTROABS 10.1* 9.7*  --   --   --   HGB 14.0 12.2 12.3 11.1* 11.8*  HCT 39.5 35.1* 35.6* 32.7* 35.0*  MCV 101.0* 101.4* 102.3* 103.5* 104.8*  PLT 138* 134* 126* 120* 126*   Basic Metabolic Panel:  Recent Labs Lab 05/18/15 1617 05/19/15 0329 05/20/15 0309 05/21/15 0308 05/22/15 0426  NA 130* 132* 134* 135 140  K 3.0* 2.7* 2.8* 3.5 3.0*  CL 95* 95* 98* 100* 99*  CO2 23 25 27 26 30   GLUCOSE 153* 124* 89 80 79  BUN <5* <5* <5* <5* 7  CREATININE <0.30* 0.39* <0.30* <0.30* <0.30*  CALCIUM 7.6* 7.9* 7.6* 7.8* 7.7*  MG  --  2.2  --   --   --    GFR: CrCl cannot be calculated (Patient has no serum creatinine result on file.). Liver Function Tests:  Recent Labs Lab 05/18/15 0915 05/18/15 1617 05/19/15 0329 05/20/15 0309 05/21/15 0308  AST 174* 154* 139* 132*  139*  ALT 126* 116* 112* 100* 89*  ALKPHOS 359* 336* 313* 278* 280*  BILITOT 8.7* 8.8* 10.0* 10.8* 10.9*  PROT 6.1* 5.7* 5.4* 4.8* 5.1*  ALBUMIN 2.5* 2.2* 2.4* 1.9* 2.0*   No results for input(s): LIPASE, AMYLASE in the last 168 hours.  Recent Labs Lab 05/19/15 1055  AMMONIA 108*   Coagulation Profile:  Recent Labs Lab 05/18/15 1617 05/19/15 0955 05/21/15 0308  INR 1.32 1.45 1.37   Urine analysis:    Component Value Date/Time   COLORURINE ORANGE* 05/18/2015 1033   APPEARANCEUR CLOUDY* 05/18/2015 1033   LABSPEC 1.021 05/18/2015 1033   PHURINE 6.0 05/18/2015 1033   GLUCOSEU NEGATIVE 05/18/2015 1033   HGBUR LARGE* 05/18/2015 1033   BILIRUBINUR LARGE* 05/18/2015 1033   KETONESUR 40* 05/18/2015 1033   PROTEINUR 30* 05/18/2015 1033   UROBILINOGEN 0.2 09/14/2011 2141   NITRITE POSITIVE* 05/18/2015 1033   LEUKOCYTESUR MODERATE* 05/18/2015 1033   Sepsis Labs: Invalid input(s): PROCALCITONIN,  LACTICIDVEN  Recent Results (from the past 240 hour(s))  Urine culture     Status: Abnormal   Collection Time: 05/18/15 10:33 AM  Result Value Ref Range Status   Specimen Description URINE, CATHETERIZED  Final   Special Requests Normal  Final   Culture (A)  Final    >=100,000 COLONIES/mL ESCHERICHIA COLI >=100,000 COLONIES/mL KLEBSIELLA OXYTOCA    Report Status 05/21/2015 FINAL  Final   Organism ID, Bacteria ESCHERICHIA COLI (A)  Final   Organism ID, Bacteria KLEBSIELLA OXYTOCA (A)  Final      Susceptibility   Escherichia coli - MIC*    AMPICILLIN <=2 SENSITIVE Sensitive     CEFAZOLIN <=4 SENSITIVE Sensitive     CEFTRIAXONE <=1 SENSITIVE Sensitive     CIPROFLOXACIN <=0.25 SENSITIVE Sensitive     GENTAMICIN <=1 SENSITIVE Sensitive     IMIPENEM <=0.25 SENSITIVE Sensitive     NITROFURANTOIN <=16 SENSITIVE Sensitive     TRIMETH/SULFA <=20 SENSITIVE Sensitive     AMPICILLIN/SULBACTAM <=2 SENSITIVE Sensitive     PIP/TAZO <=4 SENSITIVE Sensitive     * >=100,000 COLONIES/mL  ESCHERICHIA COLI   Klebsiella oxytoca - MIC*    AMPICILLIN 16 RESISTANT Resistant     CEFAZOLIN <=4 SENSITIVE Sensitive     CEFTRIAXONE <=1 SENSITIVE Sensitive     CIPROFLOXACIN <=0.25 SENSITIVE Sensitive     GENTAMICIN <=1 SENSITIVE Sensitive     IMIPENEM <=0.25 SENSITIVE Sensitive     NITROFURANTOIN 32 SENSITIVE Sensitive     TRIMETH/SULFA <=20 SENSITIVE Sensitive     AMPICILLIN/SULBACTAM 4 SENSITIVE Sensitive     PIP/TAZO <=4 SENSITIVE Sensitive     * >=100,000 COLONIES/mL KLEBSIELLA OXYTOCA  Culture, blood (routine x 2)     Status: None (Preliminary result)   Collection Time: 05/18/15 11:34 AM  Result Value Ref Range Status   Specimen Description BLOOD LEFT HAND  Final   Special Requests BOTTLES DRAWN AEROBIC ONLY 6CC  Final   Culture   Final    NO GROWTH 3 DAYS Performed at [facility]    Report Status PENDING  Incomplete  Culture, blood (routine x 2)     Status: None (Preliminary result)   Collection Time: 05/18/15 12:07 PM  Result Value Ref Range Status   Specimen Description BLOOD LEFT HAND  Final   Special Requests BOTTLES DRAWN AEROBIC ONLY 5CC  Final   Culture   Final    NO GROWTH 3 DAYS Performed at [facility]    Report Status PENDING  Incomplete  Culture, body fluid-bottle     Status: None (Preliminary result)   Collection Time: 05/18/15  2:01 PM  Result Value Ref Range Status   Specimen Description FLUID  Final   Special Requests BOTTLES DRAWN AEROBIC ONLY 10CC  Final   Gram Stain   Final    RARE WBC PRESENT,BOTH PMN AND MONONUCLEAR NO ORGANISMS SEEN    Culture   Final    NO GROWTH 3 DAYS Performed at [facility]    Report Status PENDING  Incomplete  Culture, blood (x 2)     Status: None (Preliminary result)   Collection Time: 05/18/15  4:16 PM  Result Value Ref Range Status   Specimen Description BLOOD RIGHT HAND  Final   Special Requests IN PEDIATRIC BOTTLE 2CC  Final   Culture   Final    NO GROWTH 3 DAYS Performed at  [facility]    Report Status PENDING  Incomplete  Culture, blood (x  2)     Status: None (Preliminary result)   Collection Time: 05/18/15  4:22 PM  Result Value Ref Range Status   Specimen Description BLOOD LEFT HAND  Final   Special Requests IN PEDIATRIC BOTTLE 1CC  Final   Culture   Final    NO GROWTH 3 DAYS Performed at [facility]    Report Status PENDING  Incomplete  MRSA PCR Screening     Status: Abnormal   Collection Time: 05/18/15  6:00 PM  Result Value Ref Range Status   MRSA by PCR POSITIVE (A) NEGATIVE Final    Comment:        The GeneXpert MRSA Assay (FDA approved for NASAL specimens only), is one component of a comprehensive MRSA colonization surveillance program. It is not intended to diagnose MRSA infection nor to guide or monitor treatment for MRSA infections. RESULT CALLED TO, READ BACK BY AND VERIFIED WITH: M.HAIRSTON,RN AT 1958 ON 05/18/15 BY W.[facility]       Radiology Studies: No results found.   Scheduled Meds: . amantadine  100 mg Oral BID  . antiseptic oral rinse  7 mL Mouth Rinse BID  . benztropine  0.5 mg Oral QHS  . cefTRIAXone (ROCEPHIN)  IV  1 g Intravenous Q24H  . Chlorhexidine Gluconate Cloth  6 each Topical Q0600  . enoxaparin (LOVENOX) injection  40 mg Subcutaneous Q24H  . FLUoxetine  20 mg Oral Daily  . folic acid  1 mg Oral Daily  . furosemide  40 mg Intravenous BID  . lactulose  30 g Oral TID  . mometasone-formoterol  2 puff Inhalation BID  . multivitamin with minerals  1 tablet Oral Daily  . mupirocin ointment  1 application Nasal BID  . nicotine  21 mg Transdermal Daily  . pantoprazole  40 mg Oral BID  . potassium chloride  40 mEq Oral Daily  . risperiDONE  1.5 mg Oral QHS  . sodium chloride flush  3 mL Intravenous Q12H  . sucralfate  1 g Oral TID WC & HS  . thiamine  100 mg Oral Daily  . topiramate  100 mg Oral Daily    Clint Lipps, MD [facility] Pager [phone]  If 7PM-7AM, please contact  night-coverage [email or web address] Password [facility] 05/22/2015, 11:35 AM

## 2015-05-23 LAB — CULTURE, BLOOD (ROUTINE X 2)
CULTURE: NO GROWTH
CULTURE: NO GROWTH
CULTURE: NO GROWTH
CULTURE: NO GROWTH

## 2015-05-23 LAB — BASIC METABOLIC PANEL
Anion gap: 10 (ref 5–15)
BUN: 7 mg/dL (ref 6–20)
CALCIUM: 7.6 mg/dL — AB (ref 8.9–10.3)
CO2: 30 mmol/L (ref 22–32)
Chloride: 96 mmol/L — ABNORMAL LOW (ref 101–111)
Glucose, Bld: 86 mg/dL (ref 65–99)
Potassium: 2.9 mmol/L — ABNORMAL LOW (ref 3.5–5.1)
Sodium: 136 mmol/L (ref 135–145)

## 2015-05-23 LAB — CULTURE, BODY FLUID W GRAM STAIN -BOTTLE: Culture: NO GROWTH

## 2015-05-23 LAB — CULTURE, BODY FLUID-BOTTLE

## 2015-05-23 LAB — AMMONIA: AMMONIA: 96 umol/L — AB (ref 9–35)

## 2015-05-23 LAB — FERRITIN: Ferritin: 229 ng/mL (ref 11–307)

## 2015-05-23 MED ORDER — POTASSIUM CHLORIDE CRYS ER 20 MEQ PO TBCR
60.0000 meq | EXTENDED_RELEASE_TABLET | Freq: Four times a day (QID) | ORAL | Status: AC
  Administered 2015-05-23 (×3): 60 meq via ORAL
  Filled 2015-05-23 (×3): qty 3

## 2015-05-23 MED ORDER — RIFAXIMIN 550 MG PO TABS
550.0000 mg | ORAL_TABLET | Freq: Two times a day (BID) | ORAL | Status: DC
  Administered 2015-05-23 – 2015-06-05 (×26): 550 mg via ORAL
  Filled 2015-05-23 (×29): qty 1

## 2015-05-23 MED ORDER — DEXTROSE 5 % IV SOLN
2.0000 g | INTRAVENOUS | Status: DC
  Administered 2015-05-23 – 2015-05-26 (×4): 2 g via INTRAVENOUS
  Filled 2015-05-23 (×4): qty 2

## 2015-05-23 NOTE — Progress Notes (Signed)
Anne Black 11:05 AM  Subjective: Patient doing better than yesterday and answers questions and is a little more alert and follows directions and no specific complaints  Objective: Vital signs stable afebrile no acute distress abdomen is soft nontender some ascites positive flap cirrhosis labs pending  Assessment: Probable alcohol cirrhosis with encephalopathy  Plan: Continue current management adjust lactulose to 3 bowel movements a day and please call me this weekend if I can be of any further assistance  [facility] E  Pager [phone] After 5PM or if no answer call [phone]

## 2015-05-23 NOTE — Progress Notes (Signed)
PROGRESS NOTE  Anne Black ZOX:[id number] DOB: 03/03/1960 DOA: 05/18/2015 PCP: No primary care provider on file. Outpatient Specialists:    LOS: 5 days   Subjective: More awake and alert than yesterday, continues to have good bowel movements. Diffuse anasarca continue diuresis.  Brief Narrative: 55 y.o. female with a history of multiple sclerosis, bipolar disorder, depression and anxiety, and ETOH dependence presented to the emergency department 3 episodes of diarrhea with nausea and vomiting in the last day. She also complained of some burning abdominal pain which is been constant, more so in the upper right quadrant. Patient has been to the emergency department twice this past week for similar presentation and refused admission on 1 occasion. Patient also states that she would like to undergo alcohol detox. In the emergency department, patient was found to have tachycardia with elevated lactic acid and leukocytosis. CT of the abdomen showed hepatic cirrhosis.  Assessment & Plan: Active Problems:   MS (multiple sclerosis) (HCC)   Bipolar disorder, curr episode mixed, severe, w/o psychotic features (HCC)   Nicotine abuse   Alcohol withdrawal (HCC)   Abdominal pain   Anxiety   Sepsis (HCC)   UTI (lower urinary tract infection)   Hepatic cirrhosis (HCC)   Hepatic cirrhosis/elevated LFTs/decompensated with ascites as well as hepatic encephalopathy - Likely secondary to alcohol use - Decompensated with ascites, anasarca, hepatic encephalopathy - Ammonia: 108 on 4/10 - Continue current lactulose dose, improving since yesterday, avoid sedatives. - Her mentation improved since yesterday, more awake and alert but still slow.  Sepsis secondary to ?SBP vs UTI - Upon admission, she was tachycardic with leukocytosis, tachypnea and lactic acid of 5.4. - CT abdomen showed hepatic cirrhosis, moderate amount of ascites - EDP did perform bedside paracentesis, cell count was not sent, we will  not repeat paracentesis only for cell count. - The urine culture showed Escherichia coli and Klebsiella, switched to ceftriaxone, as she having difficulty with oral meds.  Hypokalemia - 04/11 potassium was 2.8 - Continue aggressive potassium supplementation, added Aldactone..  Alcohol abuse with withdrawal - On CIWA  - Continue thiamine, multivitamin, folic acid - Had prolonged discussion with her mother and her Ex, patient lives alone in very stressful conditions. - She drinks about every day and on much she drinks.  Abdominal pain with nausea and vomiting diarrhea - Possibly due to the above versus withdrawal symptoms - Continue pain control, antiemetics  Prolonged QTc - Avoid QT prolonging meds  Anxiety/depression/bipolar disorder - Continue Cogentin, Risperdal, Prozac  Migraine headaches - Continue Topamax  Multiple sclerosis/ADHD - Hold Provigil due to elevated LFTs   Tobacco abuse - Continue nicotine patch  Hyponatremia - Likely in the setting of fluid overload, received Lasix 4/10 with slight improvement - Continue to monitor  Macrocytosis - Likely in the setting of alcohol abuse - Unable to check B12 and folate.  - Monitor  Thrombocytopenia - Due to liver disease, monitor, discontinue Lovenox if it goes less than 100 K   DVT prophylaxis: Lovenox Code Status: Full Condition: Guarded Family Communication: None at bedside. Communicated plan with patient. Disposition Plan: Admitted to stepdown.  Antimicrobials: - Zosyn 4/9 >>   Objective: Filed Vitals:   05/23/15 0900 05/23/15 1000 05/23/15 1100 05/23/15 1200  BP:  124/93  119/60  Pulse: 88 95 93 91  Temp:      TempSrc:      Resp: 23 20 26 30   Height:      Weight:      SpO2:  96% 96% 98% 96%    Intake/Output Summary (Last 24 hours) at 05/23/15 1244 Last data filed at 05/23/15 1200  Gross per 24 hour  Intake    675 ml  Output   4300 ml  Net  -3625 ml   Filed Weights   05/21/15 0500  05/22/15 0500 05/23/15 0400  Weight: 97.9 kg (215 lb 13.3 oz) 96 kg (211 lb 10.3 oz) 94.4 kg (208 lb 1.8 oz)    Examination: Constitutional: anxious  Filed Vitals:   05/23/15 0900 05/23/15 1000 05/23/15 1100 05/23/15 1200  BP:  124/93  119/60  Pulse: 88 95 93 91  Temp:      TempSrc:      Resp: Height:      Weight:      SpO2: 96% 96% 98% 96%   Eyes: PERRL, + sclerae icterus  ENMT: Mucous membranes are moist.  Respiratory: clear to auscultation bilaterally, no wheezing, no crackles. Abdominal breathing pattern  Cardiovascular: Regular rate and rhythm, no murmurs / rubs / gallops. 2+ Bilateral extremity pitting edema. pedal pulses present.  Abdomen: no tenderness, no masses palpated. Bowel sounds positive. Distended  Neurologic: more awake, but still confused. Speech is Unintelligible. Nonfocal  Data Reviewed: I have personally reviewed following labs and imaging studies  CBC:  Recent Labs Lab 05/18/15 0915 05/18/15 1617 05/19/15 0329 05/20/15 0309 05/21/15 0308  WBC 13.0* 12.4* 11.8* 10.1 11.3*  NEUTROABS 10.1* 9.7*  --   --   --   HGB 14.0 12.2 12.3 11.1* 11.8*  HCT 39.5 35.1* 35.6* 32.7* 35.0*  MCV 101.0* 101.4* 102.3* 103.5* 104.8*  PLT 138* 134* 126* 120* 126*   Basic Metabolic Panel:  Recent Labs Lab 05/19/15 0329 05/20/15 0309 05/21/15 0308 05/22/15 0426 05/23/15 0554  NA 132* 134* 135 140 136  K 2.7* 2.8* 3.5 3.0* 2.9*  CL 95* 98* 100* 99* 96*  CO2 GLUCOSE 124* 89 80 79 86  BUN <5* <5* <5* 7 7  CREATININE 0.39* <0.30* <0.30* <0.30* <0.30*  CALCIUM 7.9* 7.6* 7.8* 7.7* 7.6*  MG 2.2  --   --   --   --    GFR: CrCl cannot be calculated (Patient has no serum creatinine result on file.). Liver Function Tests:  Recent Labs Lab 05/18/15 0915 05/18/15 1617 05/19/15 0329 05/20/15 0309 05/21/15 0308  AST 174* 154* 139* 132* 139*  ALT 126* 116* 112* 100* 89*  ALKPHOS 359* 336* 313* 278* 280*  BILITOT 8.7* 8.8*  10.0* 10.8* 10.9*  PROT 6.1* 5.7* 5.4* 4.8* 5.1*  ALBUMIN 2.5* 2.2* 2.4* 1.9* 2.0*   No results for input(s): LIPASE, AMYLASE in the last 168 hours.  Recent Labs Lab 05/19/15 1055  AMMONIA 108*   Coagulation Profile:  Recent Labs Lab 05/18/15 1617 05/19/15 0955 05/21/15 0308  INR 1.32 1.45 1.37   Urine analysis:    Component Value Date/Time   COLORURINE ORANGE* 05/18/2015 1033   APPEARANCEUR CLOUDY* 05/18/2015 1033   LABSPEC 1.021 05/18/2015 1033   PHURINE 6.0 05/18/2015 1033   GLUCOSEU NEGATIVE 05/18/2015 1033   HGBUR LARGE* 05/18/2015 1033   BILIRUBINUR LARGE* 05/18/2015 1033   KETONESUR 40* 05/18/2015 1033   PROTEINUR 30* 05/18/2015 1033   UROBILINOGEN 0.2 09/14/2011 2141   NITRITE POSITIVE* 05/18/2015 1033   LEUKOCYTESUR MODERATE* 05/18/2015 1033   Sepsis Labs: Invalid input(s): PROCALCITONIN, LACTICIDVEN  Recent Results (from the past 240 hour(s))  Urine culture     Status:  Abnormal   Collection Time: 05/18/15 10:33 AM  Result Value Ref Range Status   Specimen Description URINE, CATHETERIZED  Final   Special Requests Normal  Final   Culture (A)  Final    >=100,000 COLONIES/mL ESCHERICHIA COLI >=100,000 COLONIES/mL KLEBSIELLA OXYTOCA    Report Status 05/21/2015 FINAL  Final   Organism ID, Bacteria ESCHERICHIA COLI (A)  Final   Organism ID, Bacteria KLEBSIELLA OXYTOCA (A)  Final      Susceptibility   Escherichia coli - MIC*    AMPICILLIN <=2 SENSITIVE Sensitive     CEFAZOLIN <=4 SENSITIVE Sensitive     CEFTRIAXONE <=1 SENSITIVE Sensitive     CIPROFLOXACIN <=0.25 SENSITIVE Sensitive     GENTAMICIN <=1 SENSITIVE Sensitive     IMIPENEM <=0.25 SENSITIVE Sensitive     NITROFURANTOIN <=16 SENSITIVE Sensitive     TRIMETH/SULFA <=20 SENSITIVE Sensitive     AMPICILLIN/SULBACTAM <=2 SENSITIVE Sensitive     PIP/TAZO <=4 SENSITIVE Sensitive     * >=100,000 COLONIES/mL ESCHERICHIA COLI   Klebsiella oxytoca - MIC*    AMPICILLIN 16 RESISTANT Resistant      CEFAZOLIN <=4 SENSITIVE Sensitive     CEFTRIAXONE <=1 SENSITIVE Sensitive     CIPROFLOXACIN <=0.25 SENSITIVE Sensitive     GENTAMICIN <=1 SENSITIVE Sensitive     IMIPENEM <=0.25 SENSITIVE Sensitive     NITROFURANTOIN 32 SENSITIVE Sensitive     TRIMETH/SULFA <=20 SENSITIVE Sensitive     AMPICILLIN/SULBACTAM 4 SENSITIVE Sensitive     PIP/TAZO <=4 SENSITIVE Sensitive     * >=100,000 COLONIES/mL KLEBSIELLA OXYTOCA  Culture, blood (routine x 2)     Status: None (Preliminary result)   Collection Time: 05/18/15 11:34 AM  Result Value Ref Range Status   Specimen Description BLOOD LEFT HAND  Final   Special Requests BOTTLES DRAWN AEROBIC ONLY 6CC  Final   Culture   Final    NO GROWTH 4 DAYS Performed at [facility]    Report Status PENDING  Incomplete  Culture, blood (routine x 2)     Status: None (Preliminary result)   Collection Time: 05/18/15 12:07 PM  Result Value Ref Range Status   Specimen Description BLOOD LEFT HAND  Final   Special Requests BOTTLES DRAWN AEROBIC ONLY 5CC  Final   Culture   Final    NO GROWTH 4 DAYS Performed at [facility]    Report Status PENDING  Incomplete  Culture, body fluid-bottle     Status: None (Preliminary result)   Collection Time: 05/18/15  2:01 PM  Result Value Ref Range Status   Specimen Description FLUID  Final   Special Requests BOTTLES DRAWN AEROBIC ONLY 10CC  Final   Gram Stain   Final    RARE WBC PRESENT,BOTH PMN AND MONONUCLEAR NO ORGANISMS SEEN    Culture   Final    NO GROWTH 4 DAYS Performed at [facility]    Report Status PENDING  Incomplete  Culture, blood (x 2)     Status: None (Preliminary result)   Collection Time: 05/18/15  4:16 PM  Result Value Ref Range Status   Specimen Description BLOOD RIGHT HAND  Final   Special Requests IN PEDIATRIC BOTTLE 2CC  Final   Culture   Final    NO GROWTH 4 DAYS Performed at [facility]    Report Status PENDING  Incomplete  Culture, blood (x 2)     Status:  None (Preliminary result)   Collection Time: 05/18/15  4:22 PM  Result Value Ref Range Status   Specimen Description BLOOD LEFT HAND  Final   Special Requests IN PEDIATRIC BOTTLE 1CC  Final   Culture   Final    NO GROWTH 4 DAYS Performed at [facility]    Report Status PENDING  Incomplete  MRSA PCR Screening     Status: Abnormal   Collection Time: 05/18/15  6:00 PM  Result Value Ref Range Status   MRSA by PCR POSITIVE (A) NEGATIVE Final    Comment:        The GeneXpert MRSA Assay (FDA approved for NASAL specimens only), is one component of a comprehensive MRSA colonization surveillance program. It is not intended to diagnose MRSA infection nor to guide or monitor treatment for MRSA infections. RESULT CALLED TO, READ BACK BY AND VERIFIED WITH: M.HAIRSTON,RN AT 1958 ON 05/18/15 BY W.[place]       Radiology Studies: No results found.   Scheduled Meds: . amantadine  100 mg Oral BID  . antiseptic oral rinse  7 mL Mouth Rinse BID  . benztropine  0.5 mg Oral QHS  . cefTRIAXone (ROCEPHIN)  IV  1 g Intravenous Q24H  . enoxaparin (LOVENOX) injection  40 mg Subcutaneous Q24H  . FLUoxetine  20 mg Oral Daily  . folic acid  1 mg Oral Daily  . furosemide  40 mg Intravenous BID  . lactulose  30 g Oral QID  . mometasone-formoterol  2 puff Inhalation BID  . multivitamin with minerals  1 tablet Oral Daily  . mupirocin ointment  1 application Nasal BID  . nicotine  21 mg Transdermal Daily  . pantoprazole  40 mg Oral BID  . potassium chloride  60 mEq Oral Q6H  . risperiDONE  1.5 mg Oral QHS  . spironolactone  100 mg Oral Daily  . sucralfate  1 g Oral TID WC & HS  . thiamine  100 mg Oral Daily  . topiramate  100 mg Oral Daily    Clint Lipps, MD [facility] Pager [phone]  If 7PM-7AM, please contact night-coverage [email or web address] Password [facility] 05/23/2015, 12:44 PM

## 2015-05-24 LAB — CERULOPLASMIN: CERULOPLASMIN: 32.9 mg/dL (ref 19.0–39.0)

## 2015-05-24 LAB — HEPATITIS PANEL, ACUTE
HEP A IGM: NEGATIVE
HEP B C IGM: NEGATIVE
HEP B S AG: NEGATIVE

## 2015-05-24 LAB — BASIC METABOLIC PANEL
ANION GAP: 14 (ref 5–15)
BUN: 7 mg/dL (ref 6–20)
CALCIUM: 7.8 mg/dL — AB (ref 8.9–10.3)
CO2: 30 mmol/L (ref 22–32)
CREATININE: 0.37 mg/dL — AB (ref 0.44–1.00)
Chloride: 93 mmol/L — ABNORMAL LOW (ref 101–111)
GFR calc Af Amer: >60 mL/min (ref >60)
GFR calc non Af Amer: >60 mL/min (ref >60)
GLUCOSE: 95 mg/dL (ref 65–99)
Potassium: 2.1 mmol/L — CL (ref 3.5–5.1)
Sodium: 137 mmol/L (ref 135–145)

## 2015-05-24 LAB — CBC
HCT: 36 % (ref 36.0–46.0)
HEMOGLOBIN: 12.4 g/dL (ref 12.0–15.0)
MCH: 36.5 pg — AB (ref 26.0–34.0)
MCHC: 34.4 g/dL (ref 30.0–36.0)
MCV: 105.9 fL — ABNORMAL HIGH (ref 78.0–100.0)
Platelets: 110 10*3/uL — ABNORMAL LOW (ref 150–400)
RBC: 3.4 MIL/uL — ABNORMAL LOW (ref 3.87–5.11)
RDW: 23.4 % — ABNORMAL HIGH (ref 11.5–15.5)
WBC: 14.5 10*3/uL — ABNORMAL HIGH (ref 4.0–10.5)

## 2015-05-24 LAB — ANTI-SMITH ANTIBODY: ENA SM Ab Ser-aCnc: <0.2 AI (ref 0.0–0.9)

## 2015-05-24 MED ORDER — POTASSIUM CHLORIDE CRYS ER 20 MEQ PO TBCR
60.0000 meq | EXTENDED_RELEASE_TABLET | Freq: Four times a day (QID) | ORAL | Status: AC
  Administered 2015-05-24 (×2): 60 meq via ORAL
  Filled 2015-05-24 (×3): qty 3

## 2015-05-24 MED ORDER — MAGNESIUM SULFATE 2 GM/50ML IV SOLN
2.0000 g | Freq: Once | INTRAVENOUS | Status: AC
  Administered 2015-05-24: 2 g via INTRAVENOUS
  Filled 2015-05-24: qty 50

## 2015-05-24 MED ORDER — POTASSIUM CHLORIDE 10 MEQ/100ML IV SOLN
10.0000 meq | INTRAVENOUS | Status: DC
  Filled 2015-05-24 (×2): qty 100

## 2015-05-24 MED ORDER — POTASSIUM CHLORIDE 10 MEQ/100ML IV SOLN
10.0000 meq | INTRAVENOUS | Status: AC
  Administered 2015-05-24 (×4): 10 meq via INTRAVENOUS
  Filled 2015-05-24 (×3): qty 100

## 2015-05-24 NOTE — Progress Notes (Signed)
PROGRESS NOTE  Anne Black:[id number] DOB: 05-02-1960 DOA: 05/18/2015 PCP: No primary care provider on file. Outpatient Specialists:    LOS: 6 days   Subjective: Had prolonged discussion with family yesterday, they're concerned about her when she goes home. Minor bleeding, nursing staff thinks it's vaginal (patient has rectal tube)  Brief Narrative: 55 y.o. female with a history of multiple sclerosis, bipolar disorder, depression and anxiety, and ETOH dependence presented to the emergency department 3 episodes of diarrhea with nausea and vomiting in the last day. She also complained of some burning abdominal pain which is been constant, more so in the upper right quadrant. Patient has been to the emergency department twice this past week for similar presentation and refused admission on 1 occasion. Patient also states that she would like to undergo alcohol detox. In the emergency department, patient was found to have tachycardia with elevated lactic acid and leukocytosis. CT of the abdomen showed hepatic cirrhosis.  Assessment & Plan: Active Problems:   MS (multiple sclerosis) (HCC)   Bipolar disorder, curr episode mixed, severe, w/o psychotic features (HCC)   Nicotine abuse   Alcohol withdrawal (HCC)   Abdominal pain   Anxiety   Sepsis (HCC)   UTI (lower urinary tract infection)   Hepatic cirrhosis (HCC)   Hepatic cirrhosis/elevated LFTs/decompensated with ascites as well as hepatic encephalopathy - Likely secondary to alcohol use - Decompensated with ascites, anasarca, hepatic encephalopathy - Ammonia: 108 on 4/10 - Continue current lactulose dose, improving since yesterday, avoid sedatives.  Sepsis secondary to ?SBP vs UTI - Upon admission, she was tachycardic with leukocytosis, tachypnea and lactic acid of 5.4. - CT abdomen showed hepatic cirrhosis, moderate amount of ascites - EDP did perform bedside paracentesis, cell count was not sent, we will not repeat paracentesis  only for cell count. - The urine culture showed Escherichia coli and Klebsiella, switched to ceftriaxone, as she having difficulty with oral meds.  Hypokalemia - 04/11 potassium was 2.8 - Severe hypokalemia, patient to be given 180 mg until off oral potassium and 40 mEq of IV potassium - Check BMP later today, hold Lasix for 1 day to make sure Potassium is up. Given IV magnesium check mag in a.m.  Alcohol abuse with withdrawal - On CIWA  - Continue thiamine, multivitamin, folic acid - Had prolonged discussion with her mother and her Ex, patient lives alone in very stressful conditions. - She drinks about every day and on much she drinks.  Abdominal pain with nausea and vomiting diarrhea - Possibly due to the above versus withdrawal symptoms - Continue pain control, antiemetics  Prolonged QTc - Avoid QT prolonging meds  Anxiety/depression/bipolar disorder - Continue Cogentin, Risperdal, Prozac  Migraine headaches - Continue Topamax  Multiple sclerosis/ADHD - Hold Provigil due to elevated LFTs   Tobacco abuse - Continue nicotine patch  Hyponatremia - Likely in the setting of fluid overload, received Lasix 4/10 with slight improvement - Continue to monitor  Macrocytosis - Likely in the setting of alcohol abuse - Unable to check B12 and folate.  - Monitor  Thrombocytopenia - Due to liver disease, monitor, discontinue Lovenox if it goes less than 100 K   DVT prophylaxis: Lovenox Code Status: Full Condition: Guarded Family Communication: None at bedside. Communicated plan with patient. Disposition Plan: Admitted to stepdown.  Antimicrobials: - Zosyn 4/9 >>   Objective: Filed Vitals:   05/24/15 0500 05/24/15 0800 05/24/15 0849 05/24/15 0900  BP:    110/57  Pulse:    89  Temp:  98.6 F (37 C)    TempSrc:  Axillary    Resp:    24  Height:      Weight: 90.9 kg (200 lb 6.4 oz)     SpO2:   90% 99%    Intake/Output Summary (Last 24 hours) at 05/24/15  1122 Last data filed at 05/24/15 0540  Gross per 24 hour  Intake    240 ml  Output   2650 ml  Net  -2410 ml   Filed Weights   05/22/15 0500 05/23/15 0400 05/24/15 0500  Weight: 96 kg (211 lb 10.3 oz) 94.4 kg (208 lb 1.8 oz) 90.9 kg (200 lb 6.4 oz)    Examination: Constitutional: anxious  Filed Vitals:   05/24/15 0500 05/24/15 0800 05/24/15 0849 05/24/15 0900  BP:    110/57  Pulse:    89  Temp:  98.6 F (37 C)    TempSrc:  Axillary    Resp:    24  Height:      Weight: 90.9 kg (200 lb 6.4 oz)     SpO2:   90% 99%   Eyes: PERRL, + sclerae icterus  ENMT: Mucous membranes are moist.  Respiratory: clear to auscultation bilaterally, no wheezing, no crackles. Abdominal breathing pattern  Cardiovascular: Regular rate and rhythm, no murmurs / rubs / gallops. 2+ Bilateral extremity pitting edema. pedal pulses present.  Abdomen: no tenderness, no masses palpated. Bowel sounds positive. Distended  Neurologic: more awake, but still confused. Speech is Unintelligible. Nonfocal  Data Reviewed: I have personally reviewed following labs and imaging studies  CBC:  Recent Labs Lab 05/18/15 0915 05/18/15 1617 05/19/15 0329 05/20/15 0309 05/21/15 0308 05/24/15 0308  WBC 13.0* 12.4* 11.8* 10.1 11.3* 14.5*  NEUTROABS 10.1* 9.7*  --   --   --   --   HGB 14.0 12.2 12.3 11.1* 11.8* 12.4  HCT 39.5 35.1* 35.6* 32.7* 35.0* 36.0  MCV 101.0* 101.4* 102.3* 103.5* 104.8* 105.9*  PLT 138* 134* 126* 120* 126* 110*   Basic Metabolic Panel:  Recent Labs Lab 05/19/15 0329 05/20/15 0309 05/21/15 0308 05/22/15 0426 05/23/15 0554 05/24/15 0306  NA 132* 134* 135 140 136 137  K 2.7* 2.8* 3.5 3.0* 2.9* 2.1*  CL 95* 98* 100* 99* 96* 93*  CO2 25 27 26 30 30 30   GLUCOSE 124* 89 80 79 86 95  BUN <5* <5* <5* 7 7 7   CREATININE 0.39* <0.30* <0.30* <0.30* <0.30* 0.37*  CALCIUM 7.9* 7.6* 7.8* 7.7* 7.6* 7.8*  MG 2.2  --   --   --   --   --    GFR: Estimated Creatinine Clearance: 83.3 mL/min  (by C-G formula based on Cr of 0.37). Liver Function Tests:  Recent Labs Lab 05/18/15 0915 05/18/15 1617 05/19/15 0329 05/20/15 0309 05/21/15 0308  AST 174* 154* 139* 132* 139*  ALT 126* 116* 112* 100* 89*  ALKPHOS 359* 336* 313* 278* 280*  BILITOT 8.7* 8.8* 10.0* 10.8* 10.9*  PROT 6.1* 5.7* 5.4* 4.8* 5.1*  ALBUMIN 2.5* 2.2* 2.4* 1.9* 2.0*   No results for input(s): LIPASE, AMYLASE in the last 168 hours.  Recent Labs Lab 05/19/15 1055 05/23/15 2000  AMMONIA 108* 96*   Coagulation Profile:  Recent Labs Lab 05/18/15 1617 05/19/15 0955 05/21/15 0308  INR 1.32 1.45 1.37   Urine analysis:    Component Value Date/Time   COLORURINE ORANGE* 05/18/2015 1033   APPEARANCEUR CLOUDY* 05/18/2015 1033   LABSPEC 1.021 05/18/2015 1033   PHURINE 6.0  05/18/2015 1033   GLUCOSEU NEGATIVE 05/18/2015 1033   HGBUR LARGE* 05/18/2015 1033   BILIRUBINUR LARGE* 05/18/2015 1033   KETONESUR 40* 05/18/2015 1033   PROTEINUR 30* 05/18/2015 1033   UROBILINOGEN 0.2 09/14/2011 2141   NITRITE POSITIVE* 05/18/2015 1033   LEUKOCYTESUR MODERATE* 05/18/2015 1033   Sepsis Labs: Invalid input(s): PROCALCITONIN, LACTICIDVEN  Recent Results (from the past 240 hour(s))  Urine culture     Status: Abnormal   Collection Time: 05/18/15 10:33 AM  Result Value Ref Range Status   Specimen Description URINE, CATHETERIZED  Final   Special Requests Normal  Final   Culture (A)  Final    >=100,000 COLONIES/mL ESCHERICHIA COLI >=100,000 COLONIES/mL KLEBSIELLA OXYTOCA    Report Status 05/21/2015 FINAL  Final   Organism ID, Bacteria ESCHERICHIA COLI (A)  Final   Organism ID, Bacteria KLEBSIELLA OXYTOCA (A)  Final      Susceptibility   Escherichia coli - MIC*    AMPICILLIN <=2 SENSITIVE Sensitive     CEFAZOLIN <=4 SENSITIVE Sensitive     CEFTRIAXONE <=1 SENSITIVE Sensitive     CIPROFLOXACIN <=0.25 SENSITIVE Sensitive     GENTAMICIN <=1 SENSITIVE Sensitive     IMIPENEM <=0.25 SENSITIVE Sensitive      NITROFURANTOIN <=16 SENSITIVE Sensitive     TRIMETH/SULFA <=20 SENSITIVE Sensitive     AMPICILLIN/SULBACTAM <=2 SENSITIVE Sensitive     PIP/TAZO <=4 SENSITIVE Sensitive     * >=100,000 COLONIES/mL ESCHERICHIA COLI   Klebsiella oxytoca - MIC*    AMPICILLIN 16 RESISTANT Resistant     CEFAZOLIN <=4 SENSITIVE Sensitive     CEFTRIAXONE <=1 SENSITIVE Sensitive     CIPROFLOXACIN <=0.25 SENSITIVE Sensitive     GENTAMICIN <=1 SENSITIVE Sensitive     IMIPENEM <=0.25 SENSITIVE Sensitive     NITROFURANTOIN 32 SENSITIVE Sensitive     TRIMETH/SULFA <=20 SENSITIVE Sensitive     AMPICILLIN/SULBACTAM 4 SENSITIVE Sensitive     PIP/TAZO <=4 SENSITIVE Sensitive     * >=100,000 COLONIES/mL KLEBSIELLA OXYTOCA  Culture, blood (routine x 2)     Status: None   Collection Time: 05/18/15 11:34 AM  Result Value Ref Range Status   Specimen Description BLOOD LEFT HAND  Final   Special Requests BOTTLES DRAWN AEROBIC ONLY 6CC  Final   Culture   Final    NO GROWTH 5 DAYS Performed at [facility]    Report Status 05/23/2015 FINAL  Final  Culture, blood (routine x 2)     Status: None   Collection Time: 05/18/15 12:07 PM  Result Value Ref Range Status   Specimen Description BLOOD LEFT HAND  Final   Special Requests BOTTLES DRAWN AEROBIC ONLY 5CC  Final   Culture   Final    NO GROWTH 5 DAYS Performed at [facility]    Report Status 05/23/2015 FINAL  Final  Culture, body fluid-bottle     Status: None   Collection Time: 05/18/15  2:01 PM  Result Value Ref Range Status   Specimen Description FLUID  Final   Special Requests BOTTLES DRAWN AEROBIC ONLY 10CC  Final   Gram Stain   Final    RARE WBC PRESENT,BOTH PMN AND MONONUCLEAR NO ORGANISMS SEEN    Culture   Final    NO GROWTH 5 DAYS Performed at [facility]    Report Status 05/23/2015 FINAL  Final  Culture, blood (x 2)     Status: None   Collection Time: 05/18/15  4:16 PM  Result Value Ref  Range Status   Specimen Description  BLOOD RIGHT HAND  Final   Special Requests IN PEDIATRIC BOTTLE 2CC  Final   Culture   Final    NO GROWTH 5 DAYS Performed at [facility]    Report Status 05/23/2015 FINAL  Final  Culture, blood (x 2)     Status: None   Collection Time: 05/18/15  4:22 PM  Result Value Ref Range Status   Specimen Description BLOOD LEFT HAND  Final   Special Requests IN PEDIATRIC BOTTLE 1CC  Final   Culture   Final    NO GROWTH 5 DAYS Performed at [facility]    Report Status 05/23/2015 FINAL  Final  MRSA PCR Screening     Status: Abnormal   Collection Time: 05/18/15  6:00 PM  Result Value Ref Range Status   MRSA by PCR POSITIVE (A) NEGATIVE Final    Comment:        The GeneXpert MRSA Assay (FDA approved for NASAL specimens only), is one component of a comprehensive MRSA colonization surveillance program. It is not intended to diagnose MRSA infection nor to guide or monitor treatment for MRSA infections. RESULT CALLED TO, READ BACK BY AND VERIFIED WITH: M.HAIRSTON,RN AT 1958 ON 05/18/15 BY W.[facility]       Radiology Studies: No results found.   Scheduled Meds: . amantadine  100 mg Oral BID  . antiseptic oral rinse  7 mL Mouth Rinse BID  . benztropine  0.5 mg Oral QHS  . cefTRIAXone (ROCEPHIN)  IV  2 g Intravenous Q24H  . enoxaparin (LOVENOX) injection  40 mg Subcutaneous Q24H  . FLUoxetine  20 mg Oral Daily  . folic acid  1 mg Oral Daily  . lactulose  30 g Oral QID  . mometasone-formoterol  2 puff Inhalation BID  . multivitamin with minerals  1 tablet Oral Daily  . nicotine  21 mg Transdermal Daily  . pantoprazole  40 mg Oral BID  . potassium chloride  10 mEq Intravenous Q1 Hr x 4  . potassium chloride  10 mEq Intravenous Q1 Hr x 4  . potassium chloride  60 mEq Oral Q6H  . rifaximin  550 mg Oral BID  . risperiDONE  1.5 mg Oral QHS  . spironolactone  100 mg Oral Daily  . sucralfate  1 g Oral TID WC & HS  . thiamine  100 mg Oral Daily  . topiramate  100 mg Oral Daily     Clint Lipps, MD [facility] Pager [phone]  If 7PM-7AM, please contact night-coverage [email or web address] Password [email or web address] 05/24/2015, 11:22 AM

## 2015-05-25 LAB — COMPREHENSIVE METABOLIC PANEL
ALBUMIN: 2.1 g/dL — AB (ref 3.5–5.0)
ALK PHOS: 287 U/L — AB (ref 38–126)
ALT: 72 U/L — ABNORMAL HIGH (ref 14–54)
ANION GAP: 10 (ref 5–15)
AST: 154 U/L — AB (ref 15–41)
BILIRUBIN TOTAL: 12 mg/dL — AB (ref 0.3–1.2)
BUN: 8 mg/dL (ref 6–20)
CHLORIDE: 100 mmol/L — AB (ref 101–111)
CO2: 27 mmol/L (ref 22–32)
Calcium: 8.1 mg/dL — ABNORMAL LOW (ref 8.9–10.3)
Creatinine, Ser: 0.36 mg/dL — ABNORMAL LOW (ref 0.44–1.00)
Glucose, Bld: 121 mg/dL — ABNORMAL HIGH (ref 65–99)
POTASSIUM: 2.8 mmol/L — AB (ref 3.5–5.1)
Sodium: 137 mmol/L (ref 135–145)
Total Protein: 5.9 g/dL — ABNORMAL LOW (ref 6.5–8.1)

## 2015-05-25 LAB — BASIC METABOLIC PANEL
Anion gap: 6 (ref 5–15)
BUN: 7 mg/dL (ref 6–20)
CO2: 28 mmol/L (ref 22–32)
Calcium: 8.1 mg/dL — ABNORMAL LOW (ref 8.9–10.3)
Chloride: 103 mmol/L (ref 101–111)
Creatinine, Ser: <0.3 mg/dL — ABNORMAL LOW (ref 0.44–1.00)
Glucose, Bld: 105 mg/dL — ABNORMAL HIGH (ref 65–99)
POTASSIUM: 3.1 mmol/L — AB (ref 3.5–5.1)
Sodium: 137 mmol/L (ref 135–145)

## 2015-05-25 LAB — MAGNESIUM: Magnesium: 2.4 mg/dL (ref 1.7–2.4)

## 2015-05-25 MED ORDER — POTASSIUM CHLORIDE CRYS ER 10 MEQ PO TBCR
60.0000 meq | EXTENDED_RELEASE_TABLET | ORAL | Status: AC
  Administered 2015-05-25 (×2): 60 meq via ORAL
  Filled 2015-05-25: qty 3
  Filled 2015-05-25 (×2): qty 6

## 2015-05-25 MED ORDER — POTASSIUM CHLORIDE 10 MEQ/100ML IV SOLN
10.0000 meq | INTRAVENOUS | Status: AC
  Administered 2015-05-25 (×7): 10 meq via INTRAVENOUS
  Filled 2015-05-25 (×8): qty 100

## 2015-05-25 NOTE — Progress Notes (Signed)
Alerted MD to pt spitting out medications -- crushed in ice cream and family present.

## 2015-05-25 NOTE — Progress Notes (Signed)
PROGRESS NOTE  Anne Black RUE:[id number] DOB: December 08, 1960 DOA: 05/18/2015 PCP: No primary care provider on file. Outpatient Specialists:    LOS: 7 days   Subjective: Seen this morning with family members at bedside. More awake and alert, still slightly confused, per nursing staff she spitted out medications this morning. Potassium continues to be severely low at 2.8. Give more potassium parenterally and orally.  Brief Narrative: 55 y.o. female with a history of multiple sclerosis, bipolar disorder, depression and anxiety, and ETOH dependence presented to the emergency department 3 episodes of diarrhea with nausea and vomiting in the last day. She also complained of some burning abdominal pain which is been constant, more so in the upper right quadrant. Patient has been to the emergency department twice this past week for similar presentation and refused admission on 1 occasion. Patient also states that she would like to undergo alcohol detox. In the emergency department, patient was found to have tachycardia with elevated lactic acid and leukocytosis. CT of the abdomen showed hepatic cirrhosis.  Assessment & Plan: Active Problems:   MS (multiple sclerosis) (HCC)   Bipolar disorder, curr episode mixed, severe, w/o psychotic features (HCC)   Nicotine abuse   Alcohol withdrawal (HCC)   Abdominal pain   Anxiety   Sepsis (HCC)   UTI (lower urinary tract infection)   Hepatic cirrhosis (HCC)   Hepatic cirrhosis/elevated LFTs/decompensated with ascites as well as hepatic encephalopathy - Likely secondary to alcohol use - Decompensated with ascites, anasarca, hepatic encephalopathy - Ammonia: 108 on 4/10 - Continue current lactulose dose, improving since yesterday, avoid sedatives.  Sepsis secondary to ?SBP vs UTI - Upon admission, she was tachycardic with leukocytosis, tachypnea and lactic acid of 5.4. - CT abdomen showed hepatic cirrhosis, moderate amount of ascites - EDP did perform  bedside paracentesis, cell count was not sent, we will not repeat paracentesis only for cell count. - The urine culture showed Escherichia coli and Klebsiella, switched to ceftriaxone, as she having difficulty with oral meds.  Hypokalemia - 04/11 potassium was 2.8 - Severe hypokalemia, patient to be given 180 mg until off oral potassium and 40 mEq of IV potassium - Continue to supplement orally and parenterally. Not sure she is getting all of the oral meds  Alcohol abuse with withdrawal - On CIWA  - Continue thiamine, multivitamin, folic acid - Had prolonged discussion with her mother and her Ex, patient lives alone in very stressful conditions. - She drinks about every day and on much she drinks.  Abdominal pain with nausea and vomiting diarrhea - Possibly due to the above versus withdrawal symptoms - Continue pain control, antiemetics  Prolonged QTc - Avoid QT prolonging meds  Anxiety/depression/bipolar disorder - Continue Cogentin, Risperdal, Prozac  Migraine headaches - Continue Topamax  Multiple sclerosis/ADHD - Hold Provigil due to elevated LFTs   Tobacco abuse - Continue nicotine patch  Hyponatremia - Likely in the setting of fluid overload, received Lasix 4/10 with slight improvement - Continue to monitor  Macrocytosis - Likely in the setting of alcohol abuse - Unable to check B12 and folate.  - Monitor  Thrombocytopenia - Due to liver disease, monitor, discontinue Lovenox if it goes less than 100 K   DVT prophylaxis: Lovenox Code Status: Full Condition: Guarded Family Communication: None at bedside. Communicated plan with patient. Disposition Plan: Admitted to stepdown.  Antimicrobials: - Zosyn 4/9 >>   Objective: Filed Vitals:   05/24/15 1538 05/24/15 2043 05/25/15 0514 05/25/15 1404  BP: 94/54 109/49 94/34 126/42  Pulse:  95 88 88  Temp:  97.6 F (36.4 C) 98.3 F (36.8 C) 97.7 F (36.5 C)  TempSrc:  Oral Axillary Axillary  Resp:  16 16  16   Height:      Weight:      SpO2:  93% 96% 97%    Intake/Output Summary (Last 24 hours) at 05/25/15 1457 Last data filed at 05/25/15 0827  Gross per 24 hour  Intake    470 ml  Output    350 ml  Net    120 ml   Filed Weights   05/22/15 0500 05/23/15 0400 05/24/15 0500  Weight: 96 kg (211 lb 10.3 oz) 94.4 kg (208 lb 1.8 oz) 90.9 kg (200 lb 6.4 oz)    Examination: Constitutional: anxious  Filed Vitals:   05/24/15 1538 05/24/15 2043 05/25/15 0514 05/25/15 1404  BP: 94/54 109/49 94/34 126/42  Pulse:  95 88 88  Temp:  97.6 F (36.4 C) 98.3 F (36.8 C) 97.7 F (36.5 C)  TempSrc:  Oral Axillary Axillary  Resp:  16 16 16   Height:      Weight:      SpO2:  93% 96% 97%   Eyes: PERRL, + sclerae icterus  ENMT: Mucous membranes are moist.  Respiratory: clear to auscultation bilaterally, no wheezing, no crackles. Abdominal breathing pattern  Cardiovascular: Regular rate and rhythm, no murmurs / rubs / gallops. 2+ Bilateral extremity pitting edema. pedal pulses present.  Abdomen: no tenderness, no masses palpated. Bowel sounds positive. Distended  Neurologic: more awake, but still confused. Speech is Unintelligible. Nonfocal  Data Reviewed: I have personally reviewed following labs and imaging studies  CBC:  Recent Labs Lab 05/18/15 1617 05/19/15 0329 05/20/15 0309 05/21/15 0308 05/24/15 0308  WBC 12.4* 11.8* 10.1 11.3* 14.5*  NEUTROABS 9.7*  --   --   --   --   HGB 12.2 12.3 11.1* 11.8* 12.4  HCT 35.1* 35.6* 32.7* 35.0* 36.0  MCV 101.4* 102.3* 103.5* 104.8* 105.9*  PLT 134* 126* 120* 126* 110*   Basic Metabolic Panel:  Recent Labs Lab 05/19/15 0329  05/21/15 0308 05/22/15 0426 05/23/15 0554 05/24/15 0306 05/25/15 0545  NA 132*  < > 135 140 136 137 137  K 2.7*  < > 3.5 3.0* 2.9* 2.1* 2.8*  CL 95*  < > 100* 99* 96* 93* 100*  CO2 25  < > 26 30 30 30 27   GLUCOSE 124*  < > 80 79 86 95 121*  BUN <5*  < > <5* 7 7 7 8   CREATININE 0.39*  < > <0.30* <0.30*  <0.30* 0.37* 0.36*  CALCIUM 7.9*  < > 7.8* 7.7* 7.6* 7.8* 8.1*  MG 2.2  --   --   --   --   --  2.4  < > = values in this interval not displayed. GFR: Estimated Creatinine Clearance: 83.3 mL/min (by C-G formula based on Cr of 0.36). Liver Function Tests:  Recent Labs Lab 05/18/15 1617 05/19/15 0329 05/20/15 0309 05/21/15 0308 05/25/15 0545  AST 154* 139* 132* 139* 154*  ALT 116* 112* 100* 89* 72*  ALKPHOS 336* 313* 278* 280* 287*  BILITOT 8.8* 10.0* 10.8* 10.9* 12.0*  PROT 5.7* 5.4* 4.8* 5.1* 5.9*  ALBUMIN 2.2* 2.4* 1.9* 2.0* 2.1*   No results for input(s): LIPASE, AMYLASE in the last 168 hours.  Recent Labs Lab 05/19/15 1055 05/23/15 2000  AMMONIA 108* 96*   Coagulation Profile:  Recent Labs Lab 05/18/15 1617 05/19/15 0955 05/21/15 0308  INR 1.32 1.45 1.37   Urine analysis:    Component Value Date/Time   COLORURINE ORANGE* 05/18/2015 1033   APPEARANCEUR CLOUDY* 05/18/2015 1033   LABSPEC 1.021 05/18/2015 1033   PHURINE 6.0 05/18/2015 1033   GLUCOSEU NEGATIVE 05/18/2015 1033   HGBUR LARGE* 05/18/2015 1033   BILIRUBINUR LARGE* 05/18/2015 1033   KETONESUR 40* 05/18/2015 1033   PROTEINUR 30* 05/18/2015 1033   UROBILINOGEN 0.2 09/14/2011 2141   NITRITE POSITIVE* 05/18/2015 1033   LEUKOCYTESUR MODERATE* 05/18/2015 1033   Sepsis Labs: Invalid input(s): PROCALCITONIN, LACTICIDVEN  Recent Results (from the past 240 hour(s))  Urine culture     Status: Abnormal   Collection Time: 05/18/15 10:33 AM  Result Value Ref Range Status   Specimen Description URINE, CATHETERIZED  Final   Special Requests Normal  Final   Culture (A)  Final    >=100,000 COLONIES/mL ESCHERICHIA COLI >=100,000 COLONIES/mL KLEBSIELLA OXYTOCA    Report Status 05/21/2015 FINAL  Final   Organism ID, Bacteria ESCHERICHIA COLI (A)  Final   Organism ID, Bacteria KLEBSIELLA OXYTOCA (A)  Final      Susceptibility   Escherichia coli - MIC*    AMPICILLIN <=2 SENSITIVE Sensitive     CEFAZOLIN  <=4 SENSITIVE Sensitive     CEFTRIAXONE <=1 SENSITIVE Sensitive     CIPROFLOXACIN <=0.25 SENSITIVE Sensitive     GENTAMICIN <=1 SENSITIVE Sensitive     IMIPENEM <=0.25 SENSITIVE Sensitive     NITROFURANTOIN <=16 SENSITIVE Sensitive     TRIMETH/SULFA <=20 SENSITIVE Sensitive     AMPICILLIN/SULBACTAM <=2 SENSITIVE Sensitive     PIP/TAZO <=4 SENSITIVE Sensitive     * >=100,000 COLONIES/mL ESCHERICHIA COLI   Klebsiella oxytoca - MIC*    AMPICILLIN 16 RESISTANT Resistant     CEFAZOLIN <=4 SENSITIVE Sensitive     CEFTRIAXONE <=1 SENSITIVE Sensitive     CIPROFLOXACIN <=0.25 SENSITIVE Sensitive     GENTAMICIN <=1 SENSITIVE Sensitive     IMIPENEM <=0.25 SENSITIVE Sensitive     NITROFURANTOIN 32 SENSITIVE Sensitive     TRIMETH/SULFA <=20 SENSITIVE Sensitive     AMPICILLIN/SULBACTAM 4 SENSITIVE Sensitive     PIP/TAZO <=4 SENSITIVE Sensitive     * >=100,000 COLONIES/mL KLEBSIELLA OXYTOCA  Culture, blood (routine x 2)     Status: None   Collection Time: 05/18/15 11:34 AM  Result Value Ref Range Status   Specimen Description BLOOD LEFT HAND  Final   Special Requests BOTTLES DRAWN AEROBIC ONLY 6CC  Final   Culture   Final    NO GROWTH 5 DAYS Performed at [facility]    Report Status 05/23/2015 FINAL  Final  Culture, blood (routine x 2)     Status: None   Collection Time: 05/18/15 12:07 PM  Result Value Ref Range Status   Specimen Description BLOOD LEFT HAND  Final   Special Requests BOTTLES DRAWN AEROBIC ONLY 5CC  Final   Culture   Final    NO GROWTH 5 DAYS Performed at [facility]    Report Status 05/23/2015 FINAL  Final  Culture, body fluid-bottle     Status: None   Collection Time: 05/18/15  2:01 PM  Result Value Ref Range Status   Specimen Description FLUID  Final   Special Requests BOTTLES DRAWN AEROBIC ONLY 10CC  Final   Gram Stain   Final    RARE WBC PRESENT,BOTH PMN AND MONONUCLEAR NO ORGANISMS SEEN    Culture   Final    NO GROWTH 5 DAYS Performed  at  [facility]    Report Status 05/23/2015 FINAL  Final  Culture, blood (x 2)     Status: None   Collection Time: 05/18/15  4:16 PM  Result Value Ref Range Status   Specimen Description BLOOD RIGHT HAND  Final   Special Requests IN PEDIATRIC BOTTLE 2CC  Final   Culture   Final    NO GROWTH 5 DAYS Performed at [facility]    Report Status 05/23/2015 FINAL  Final  Culture, blood (x 2)     Status: None   Collection Time: 05/18/15  4:22 PM  Result Value Ref Range Status   Specimen Description BLOOD LEFT HAND  Final   Special Requests IN PEDIATRIC BOTTLE 1CC  Final   Culture   Final    NO GROWTH 5 DAYS Performed at [facility]    Report Status 05/23/2015 FINAL  Final  MRSA PCR Screening     Status: Abnormal   Collection Time: 05/18/15  6:00 PM  Result Value Ref Range Status   MRSA by PCR POSITIVE (A) NEGATIVE Final    Comment:        The GeneXpert MRSA Assay (FDA approved for NASAL specimens only), is one component of a comprehensive MRSA colonization surveillance program. It is not intended to diagnose MRSA infection nor to guide or monitor treatment for MRSA infections. RESULT CALLED TO, READ BACK BY AND VERIFIED WITH: M.HAIRSTON,RN AT 1958 ON 05/18/15 BY W.[facility]       Radiology Studies: No results found.   Scheduled Meds: . amantadine  100 mg Oral BID  . antiseptic oral rinse  7 mL Mouth Rinse BID  . cefTRIAXone (ROCEPHIN)  IV  2 g Intravenous Q24H  . enoxaparin (LOVENOX) injection  40 mg Subcutaneous Q24H  . folic acid  1 mg Oral Daily  . lactulose  30 g Oral QID  . mometasone-formoterol  2 puff Inhalation BID  . pantoprazole  40 mg Oral BID  . potassium chloride  60 mEq Oral Q4H  . potassium chloride  10 mEq Intravenous Q1 Hr x 6  . rifaximin  550 mg Oral BID  . spironolactone  100 mg Oral Daily  . thiamine  100 mg Oral Daily    Clint Lipps, MD [facility] Pager [phone]  If 7PM-7AM, please contact  night-coverage [email or web address] Password [email or web address] 05/25/2015, 2:57 PM

## 2015-05-26 DIAGNOSIS — E876 Hypokalemia: Secondary | ICD-10-CM | POA: Diagnosis present

## 2015-05-26 DIAGNOSIS — K7682 Hepatic encephalopathy: Secondary | ICD-10-CM | POA: Diagnosis present

## 2015-05-26 DIAGNOSIS — K7290 Hepatic failure, unspecified without coma: Secondary | ICD-10-CM

## 2015-05-26 DIAGNOSIS — K7200 Acute and subacute hepatic failure without coma: Secondary | ICD-10-CM | POA: Diagnosis present

## 2015-05-26 LAB — COMPREHENSIVE METABOLIC PANEL
ALT: 65 U/L — ABNORMAL HIGH (ref 14–54)
AST: 146 U/L — AB (ref 15–41)
Albumin: 2 g/dL — ABNORMAL LOW (ref 3.5–5.0)
Alkaline Phosphatase: 264 U/L — ABNORMAL HIGH (ref 38–126)
Anion gap: 10 (ref 5–15)
BILIRUBIN TOTAL: 12.5 mg/dL — AB (ref 0.3–1.2)
BUN: 7 mg/dL (ref 6–20)
CHLORIDE: 103 mmol/L (ref 101–111)
CO2: 23 mmol/L (ref 22–32)
Calcium: 8.1 mg/dL — ABNORMAL LOW (ref 8.9–10.3)
Glucose, Bld: 87 mg/dL (ref 65–99)
POTASSIUM: 3.8 mmol/L (ref 3.5–5.1)
Sodium: 136 mmol/L (ref 135–145)
TOTAL PROTEIN: 5.6 g/dL — AB (ref 6.5–8.1)

## 2015-05-26 LAB — ANTINUCLEAR ANTIBODIES, IFA: ANA Ab, IFA: NEGATIVE

## 2015-05-26 MED ORDER — POTASSIUM CHLORIDE CRYS ER 20 MEQ PO TBCR
40.0000 meq | EXTENDED_RELEASE_TABLET | Freq: Two times a day (BID) | ORAL | Status: DC
  Administered 2015-05-26 (×2): 40 meq via ORAL
  Filled 2015-05-26 (×4): qty 2

## 2015-05-26 MED ORDER — PANTOPRAZOLE SODIUM 40 MG PO TBEC
40.0000 mg | DELAYED_RELEASE_TABLET | Freq: Every day | ORAL | Status: DC
Start: 2015-05-27 — End: 2015-06-05
  Administered 2015-05-27 – 2015-06-05 (×10): 40 mg via ORAL
  Filled 2015-05-26 (×10): qty 1

## 2015-05-26 MED ORDER — FUROSEMIDE 10 MG/ML IJ SOLN
60.0000 mg | Freq: Two times a day (BID) | INTRAMUSCULAR | Status: DC
  Administered 2015-05-26 – 2015-05-27 (×4): 60 mg via INTRAVENOUS
  Filled 2015-05-26 (×8): qty 6

## 2015-05-26 MED ORDER — SPIRONOLACTONE 100 MG PO TABS
100.0000 mg | ORAL_TABLET | Freq: Two times a day (BID) | ORAL | Status: DC
  Administered 2015-05-26 – 2015-05-28 (×5): 100 mg via ORAL
  Filled 2015-05-26 (×7): qty 1

## 2015-05-26 NOTE — Evaluation (Signed)
Physical Therapy Evaluation Patient Details Name: Anne Black MRN: [id number] DOB: [birth year] Today's Date: 05/26/2015   History of Present Illness  55 y.o. female with a history of multiple sclerosis, bipolar disorder, depression and anxiety, and ETOH dependence presented with diarrhea, nausea and vomiting and admitted for Hepatic cirrhosis/elevated LFTs/decompensated with ascites as well as hepatic encephalopathy  Clinical Impression  Pt admitted with above diagnosis. Pt currently with functional limitations due to the deficits listed below (see PT Problem List).  Pt will benefit from skilled PT to increase their independence and safety with mobility to allow discharge to the venue listed below.   Pt admitted 05/18/15 and poor historian today.  Pt unable to stand once assisted to EOB.  Recommending SNF at this time.     Follow Up Recommendations SNF;Supervision/Assistance - 24 hour    Equipment Recommendations  Rolling walker with 5" wheels    Recommendations for Other Services       Precautions / Restrictions Precautions Precautions: Fall Precaution Comments: rectal tube      Mobility  Bed Mobility Overal bed mobility: Needs Assistance Bed Mobility: Supine to Sit;Sit to Supine     Supine to sit: Max assist;HOB elevated Sit to supine: Mod assist   General bed mobility comments: pt reports unable to initiate movement so assisted with bringing LEs over to EOB and raising trunk, assist for LEs onto bed  Transfers Overall transfer level: Needs assistance Equipment used: Rolling walker (2 wheeled)             General transfer comment: attempted to stand x2 however pt unable  Ambulation/Gait                Stairs            Wheelchair Mobility    Modified Rankin (Stroke Patients Only)       Balance Overall balance assessment: Needs assistance Sitting-balance support: Bilateral upper extremity supported;Feet supported Sitting balance-Leahy Scale:  Poor Sitting balance - Comments: requires UE support                                     Pertinent Vitals/Pain Pain Assessment: 0-10 Pain Score:  (not rated) Pain Location: rectum Pain Descriptors / Indicators: Sore;Tender Pain Intervention(s): Limited activity within patient's tolerance;Monitored during session;Repositioned    Home Living Family/patient expects to be discharged to:: Private residence Living Arrangements: Alone             Home Equipment: None      Prior Function Level of Independence: Independent               Hand Dominance        Extremity/Trunk Assessment               Lower Extremity Assessment: Generalized weakness      Cervical / Trunk Assessment: Normal  Communication   Communication: No difficulties  Cognition Arousal/Alertness: Awake/alert Behavior During Therapy: WFL for tasks assessed/performed Overall Cognitive Status: No family/caregiver present to determine baseline cognitive functioning Area of Impairment: Following commands       Following Commands: Follows one step commands with increased time       General Comments: pt states she came into hospital because she broke 3 toes  (unable to find any information related to this in chart review)    General Comments      Exercises  Assessment/Plan    PT Assessment Patient needs continued PT services  PT Diagnosis Difficulty walking   PT Problem List Decreased strength;Decreased activity tolerance;Decreased mobility;Decreased safety awareness;Decreased knowledge of use of DME;Decreased cognition;Decreased balance  PT Treatment Interventions DME instruction;Gait training;Functional mobility training;Patient/family education;Therapeutic activities;Therapeutic exercise;Balance training   PT Goals (Current goals can be found in the Care Plan section) Acute Rehab PT Goals PT Goal Formulation: With patient Time For Goal Achievement:  06/09/15 Potential to Achieve Goals: Good    Frequency Min 3X/week   Barriers to discharge        Co-evaluation               End of Session Equipment Utilized During Treatment: Gait belt Activity Tolerance: Patient tolerated treatment well Patient left: in bed;with call bell/phone within reach;with bed alarm set           Time: 1343-1406 PT Time Calculation (min) (ACUTE ONLY): 23 min   Charges:   PT Evaluation $PT Eval Low Complexity: 1 Procedure     PT G Codes:        Teyonna Plaisted,KATHrine E 05/26/2015, 3:16 PM Zenovia Jarred, PT, DPT 05/26/2015 Pager: [phone]

## 2015-05-26 NOTE — Progress Notes (Signed)
PROGRESS NOTE  Anne Black WUJ:[id number] DOB: 1960/12/20 DOA: 05/18/2015 PCP: No primary care provider on file.   LOS: 8 days   Subjective: More awake and alert today, continue current lactulose dose. PT/OT to evaluate and treat. Started back on Lasix and increased the Aldactone dose.  Brief Narrative: 55 y.o. female with a history of multiple sclerosis, bipolar disorder, depression and anxiety, and ETOH dependence presented to the emergency department 3 episodes of diarrhea with nausea and vomiting in the last day. She also complained of some burning abdominal pain which is been constant, more so in the upper right quadrant. Patient has been to the emergency department twice this past week for similar presentation and refused admission on 1 occasion. Patient also states that she would like to undergo alcohol detox. In the emergency department, patient was found to have tachycardia with elevated lactic acid and leukocytosis. CT of the abdomen showed hepatic cirrhosis.  Assessment & Plan: Active Problems:   MS (multiple sclerosis) (HCC)   Bipolar disorder, curr episode mixed, severe, w/o psychotic features (HCC)   Nicotine abuse   Alcohol withdrawal (HCC)   Abdominal pain   Anxiety   Sepsis (HCC)   UTI (lower urinary tract infection)   Hepatic cirrhosis (HCC)   Hypokalemia   Encephalopathy, hepatic (HCC)   Hepatic encephalopathy - Hepatic cirrhosis decompensated with ascites, anasarca and hepatic encephalopathy - Ammonia: 108 on 4/10, we'll not check ammonia level again unless her mentation is worsening. - Continue current lactulose dose and rifaximin, improving.  Hepatic cirrhosis -Likely secondary to alcohol use. Negative for hep C, negative ferritin and ceruloplasmin. -Patient with ascites, anasarca, hypoalbuminemia, hyperbilirubinemia, transaminitis and from cytopenia. -On lactulose, rifaximin, Lasix and Aldactone.  Sepsis secondary to ?SBP vs UTI - Upon admission, she  was tachycardic with leukocytosis, tachypnea and lactic acid of 5.4. - CT abdomen showed hepatic cirrhosis, moderate amount of ascites - EDP did perform bedside paracentesis, cell count was not sent, we will not repeat paracentesis only for cell count. - The urine culture showed Escherichia coli and Klebsiella, received Rocephin now for 7 days, discontinue on 05/26/2015.  Hypokalemia - 04/11 potassium was 2.8 - Had severe hypokalemia down to 2.1, she was not getting oral supplementation.  Alcohol abuse with withdrawal - Was on CIWA protocol, discontinued. - This is resolved.  Abdominal pain with nausea and vomiting diarrhea - Possibly due to the above versus withdrawal symptoms - Continue pain control, antiemetics  Prolonged QTc - Avoid QT prolonging meds  Anxiety/depression/bipolar disorder - Continue Cogentin, Risperdal, Prozac  Migraine headaches - Continue Topamax  Multiple sclerosis/ADHD - Hold Provigil due to elevated LFTs   Tobacco abuse - Continue nicotine patch  Hyponatremia - Likely in the setting of fluid overload, received Lasix 4/10 with slight improvement - Continue to monitor  Macrocytosis - Likely in the setting of alcohol abuse - Unable to check B12 and folate.  - Monitor  Thrombocytopenia - Due to liver disease, monitor, discontinue Lovenox if it goes less than 100 K   DVT prophylaxis: Lovenox Code Status: Full Condition: Guarded Family Communication: None at bedside. Communicated plan with patient. Disposition Plan: Admitted to stepdown.  Antimicrobials: - Zosyn 4/9 >>   Objective: Filed Vitals:   05/25/15 2133 05/26/15 0600 05/26/15 0753 05/26/15 1410  BP: 114/85 110/76  105/60  Pulse: 89 70 76 90  Temp: 97.7 F (36.5 C) 97.9 F (36.6 C)  97.9 F (36.6 C)  TempSrc: Oral   Oral  Resp: 16 20 22  20  Height:      Weight:      SpO2: 98% 97% 98% 98%    Intake/Output Summary (Last 24 hours) at 05/26/15 1501 Last data filed at  05/26/15 1412  Gross per 24 hour  Intake    830 ml  Output   3775 ml  Net  -2945 ml   Filed Weights   05/22/15 0500 05/23/15 0400 05/24/15 0500  Weight: 96 kg (211 lb 10.3 oz) 94.4 kg (208 lb 1.8 oz) 90.9 kg (200 lb 6.4 oz)    Examination: Constitutional: anxious  Filed Vitals:   05/25/15 2133 05/26/15 0600 05/26/15 0753 05/26/15 1410  BP: 114/85 110/76  105/60  Pulse: 89 70 76 90  Temp: 97.7 F (36.5 C) 97.9 F (36.6 C)  97.9 F (36.6 C)  TempSrc: Oral   Oral  Resp: 16 20 22 20   Height:      Weight:      SpO2: 98% 97% 98% 98%   Eyes: PERRL, + sclerae icterus  ENMT: Mucous membranes are moist.  Respiratory: clear to auscultation bilaterally, no wheezing, no crackles. Abdominal breathing pattern  Cardiovascular: Regular rate and rhythm, no murmurs / rubs / gallops. 2+ Bilateral extremity pitting edema. pedal pulses present.  Abdomen: no tenderness, no masses palpated. Bowel sounds positive. Distended  Neurologic: more awake, but still confused. Speech is Unintelligible. Nonfocal  Data Reviewed: I have personally reviewed following labs and imaging studies  CBC:  Recent Labs Lab 05/20/15 0309 05/21/15 0308 05/24/15 0308  WBC 10.1 11.3* 14.5*  HGB 11.1* 11.8* 12.4  HCT 32.7* 35.0* 36.0  MCV 103.5* 104.8* 105.9*  PLT 120* 126* 110*   Basic Metabolic Panel:  Recent Labs Lab 05/23/15 0554 05/24/15 0306 05/25/15 0545 05/25/15 1619 05/26/15 0651  NA 136 137 137 137 136  K 2.9* 2.1* 2.8* 3.1* 3.8  CL 96* 93* 100* 103 103  CO2 30 30 27 28 23   GLUCOSE 86 95 121* 105* 87  BUN 7 7 8 7 7   CREATININE <0.30* 0.37* 0.36* <0.30* <0.30*  CALCIUM 7.6* 7.8* 8.1* 8.1* 8.1*  MG  --   --  2.4  --   --    GFR: CrCl cannot be calculated (Patient has no serum creatinine result on file.). Liver Function Tests:  Recent Labs Lab 05/20/15 0309 05/21/15 0308 05/25/15 0545 05/26/15 0651  AST 132* 139* 154* 146*  ALT 100* 89* 72* 65*  ALKPHOS 278* 280* 287* 264*    BILITOT 10.8* 10.9* 12.0* 12.5*  PROT 4.8* 5.1* 5.9* 5.6*  ALBUMIN 1.9* 2.0* 2.1* 2.0*   No results for input(s): LIPASE, AMYLASE in the last 168 hours.  Recent Labs Lab 05/23/15 2000  AMMONIA 96*   Coagulation Profile:  Recent Labs Lab 05/21/15 0308  INR 1.37   Urine analysis:    Component Value Date/Time   COLORURINE ORANGE* 05/18/2015 1033   APPEARANCEUR CLOUDY* 05/18/2015 1033   LABSPEC 1.021 05/18/2015 1033   PHURINE 6.0 05/18/2015 1033   GLUCOSEU NEGATIVE 05/18/2015 1033   HGBUR LARGE* 05/18/2015 1033   BILIRUBINUR LARGE* 05/18/2015 1033   KETONESUR 40* 05/18/2015 1033   PROTEINUR 30* 05/18/2015 1033   UROBILINOGEN 0.2 09/14/2011 2141   NITRITE POSITIVE* 05/18/2015 1033   LEUKOCYTESUR MODERATE* 05/18/2015 1033   Sepsis Labs: Invalid input(s): PROCALCITONIN, LACTICIDVEN  Recent Results (from the past 240 hour(s))  Urine culture     Status: Abnormal   Collection Time: 05/18/15 10:33 AM  Result Value Ref Range Status  Specimen Description URINE, CATHETERIZED  Final   Special Requests Normal  Final   Culture (A)  Final    >=100,000 COLONIES/mL ESCHERICHIA COLI >=100,000 COLONIES/mL KLEBSIELLA OXYTOCA    Report Status 05/21/2015 FINAL  Final   Organism ID, Bacteria ESCHERICHIA COLI (A)  Final   Organism ID, Bacteria KLEBSIELLA OXYTOCA (A)  Final      Susceptibility   Escherichia coli - MIC*    AMPICILLIN <=2 SENSITIVE Sensitive     CEFAZOLIN <=4 SENSITIVE Sensitive     CEFTRIAXONE <=1 SENSITIVE Sensitive     CIPROFLOXACIN <=0.25 SENSITIVE Sensitive     GENTAMICIN <=1 SENSITIVE Sensitive     IMIPENEM <=0.25 SENSITIVE Sensitive     NITROFURANTOIN <=16 SENSITIVE Sensitive     TRIMETH/SULFA <=20 SENSITIVE Sensitive     AMPICILLIN/SULBACTAM <=2 SENSITIVE Sensitive     PIP/TAZO <=4 SENSITIVE Sensitive     * >=100,000 COLONIES/mL ESCHERICHIA COLI   Klebsiella oxytoca - MIC*    AMPICILLIN 16 RESISTANT Resistant     CEFAZOLIN <=4 SENSITIVE Sensitive      CEFTRIAXONE <=1 SENSITIVE Sensitive     CIPROFLOXACIN <=0.25 SENSITIVE Sensitive     GENTAMICIN <=1 SENSITIVE Sensitive     IMIPENEM <=0.25 SENSITIVE Sensitive     NITROFURANTOIN 32 SENSITIVE Sensitive     TRIMETH/SULFA <=20 SENSITIVE Sensitive     AMPICILLIN/SULBACTAM 4 SENSITIVE Sensitive     PIP/TAZO <=4 SENSITIVE Sensitive     * >=100,000 COLONIES/mL KLEBSIELLA OXYTOCA  Culture, blood (routine x 2)     Status: None   Collection Time: 05/18/15 11:34 AM  Result Value Ref Range Status   Specimen Description BLOOD LEFT HAND  Final   Special Requests BOTTLES DRAWN AEROBIC ONLY 6CC  Final   Culture   Final    NO GROWTH 5 DAYS Performed at [facility]    Report Status 05/23/2015 FINAL  Final  Culture, blood (routine x 2)     Status: None   Collection Time: 05/18/15 12:07 PM  Result Value Ref Range Status   Specimen Description BLOOD LEFT HAND  Final   Special Requests BOTTLES DRAWN AEROBIC ONLY 5CC  Final   Culture   Final    NO GROWTH 5 DAYS Performed at [facility]    Report Status 05/23/2015 FINAL  Final  Culture, body fluid-bottle     Status: None   Collection Time: 05/18/15  2:01 PM  Result Value Ref Range Status   Specimen Description FLUID  Final   Special Requests BOTTLES DRAWN AEROBIC ONLY 10CC  Final   Gram Stain   Final    RARE WBC PRESENT,BOTH PMN AND MONONUCLEAR NO ORGANISMS SEEN    Culture   Final    NO GROWTH 5 DAYS Performed at [facility]    Report Status 05/23/2015 FINAL  Final  Culture, blood (x 2)     Status: None   Collection Time: 05/18/15  4:16 PM  Result Value Ref Range Status   Specimen Description BLOOD RIGHT HAND  Final   Special Requests IN PEDIATRIC BOTTLE [facility]  Final   Culture   Final    NO GROWTH 5 DAYS Performed at [facility]    Report Status 05/23/2015 FINAL  Final  Culture, blood (x 2)     Status: None   Collection Time: 05/18/15  4:22 PM  Result Value Ref Range Status   Specimen Description BLOOD  LEFT HAND  Final   Special Requests IN PEDIATRIC BOTTLE  1CC  Final   Culture   Final    NO GROWTH 5 DAYS Performed at [facility]    Report Status 05/23/2015 FINAL  Final  MRSA PCR Screening     Status: Abnormal   Collection Time: 05/18/15  6:00 PM  Result Value Ref Range Status   MRSA by PCR POSITIVE (A) NEGATIVE Final    Comment:        The GeneXpert MRSA Assay (FDA approved for NASAL specimens only), is one component of a comprehensive MRSA colonization surveillance program. It is not intended to diagnose MRSA infection nor to guide or monitor treatment for MRSA infections. RESULT CALLED TO, READ BACK BY AND VERIFIED WITH: M.HAIRSTON,RN AT 1958 ON 05/18/15 BY W.[facility]       Radiology Studies: No results found.   Scheduled Meds: . amantadine  100 mg Oral BID  . antiseptic oral rinse  7 mL Mouth Rinse BID  . cefTRIAXone (ROCEPHIN)  IV  2 g Intravenous Q24H  . enoxaparin (LOVENOX) injection  40 mg Subcutaneous Q24H  . folic acid  1 mg Oral Daily  . furosemide  60 mg Intravenous BID  . lactulose  30 g Oral QID  . mometasone-formoterol  2 puff Inhalation BID  . pantoprazole  40 mg Oral BID  . potassium chloride  40 mEq Oral BID  . rifaximin  550 mg Oral BID  . spironolactone  100 mg Oral BID  . thiamine  100 mg Oral Daily    Monie Shere A, MD [facility] Pager [phone]  If 7PM-7AM, please contact night-coverage [email or web address] Password [email or web address] 05/26/2015, 3:01 PM

## 2015-05-26 NOTE — NC FL2 (Signed)
[place] MEDICAID FL2 LEVEL OF CARE SCREENING TOOL     IDENTIFICATION  Patient Name: Anne Black Birthdate: 1961/01/22 Sex: female Admission Date (Current Location): 05/18/2015  [facility] and [place] Number:  Producer, television/film/video and Address:  [facility],  [address], [place] [address]      Provider Number: [id number]  Attending Physician Name and Address:  Clydia Llano, MD  Relative Name and Phone Number:       Current Level of Care: Hospital Recommended Level of Care: Skilled Nursing Facility Prior Approval Number:    Date Approved/Denied:   PASRR Number:    Discharge Plan: SNF    Current Diagnoses: Patient Active Problem List   Diagnosis Date Noted  . Hypokalemia 05/26/2015  . Encephalopathy, hepatic (HCC) 05/26/2015  . Alcohol withdrawal (HCC) 05/18/2015  . Abdominal pain 05/18/2015  . Anxiety 05/18/2015  . Sepsis (HCC) 05/18/2015  . UTI (lower urinary tract infection) 05/18/2015  . Hepatic cirrhosis (HCC) 05/18/2015  . Stimulant abuse 12/27/2014  . Nicotine abuse 12/27/2014  . Hyperprolactinemia (HCC) 11/15/2014  . Bipolar disorder, curr episode mixed, severe, w/o psychotic features (HCC) 11/13/2014  . Dyslipidemia   . Alcohol use disorder, moderate, dependence (HCC) 10/30/2014  . Noncompliance with therapeutic plan 04/04/2013  . Unspecified hereditary and idiopathic peripheral neuropathy 05/01/2012  . GERD (gastroesophageal reflux disease) 05/01/2012  . Osteoarthrosis, unspecified whether generalized or localized, involving lower leg 05/01/2012  . MS (multiple sclerosis) (HCC) 09/14/2011    Orientation RESPIRATION BLADDER Height & Weight     Self  Normal Continent Weight: 90.9 kg (200 lb 6.4 oz) Height:  5\' 2"  (157.5 cm)  BEHAVIORAL SYMPTOMS/MOOD NEUROLOGICAL BOWEL NUTRITION STATUS   (NONE)  (NONE) Incontinent Diet (Heart Healthy)  AMBULATORY STATUS COMMUNICATION OF NEEDS Skin   Extensive Assist Verbally Normal                    Personal Care Assistance Level of Assistance  Bathing, Feeding, Dressing Bathing Assistance: Limited assistance Feeding assistance: Independent Dressing Assistance: Limited assistance     Functional Limitations Info  Sight, Hearing, Speech Sight Info: Adequate Hearing Info: Adequate Speech Info: Adequate    SPECIAL CARE FACTORS FREQUENCY  PT (By licensed PT), OT (By licensed OT)     PT Frequency: 5/week OT Frequency: 5/week            Contractures Contractures Info: Not present    Additional Factors Info  Code Status, Allergies, Isolation Precautions Code Status Info: Full Allergies Info: NKDA     Isolation Precautions Info: Contact isolation for MRSA by PCR.     Current Medications (05/26/2015):  This is the current hospital active medication list Current Facility-Administered Medications  Medication Dose Route Frequency Provider Last Rate Last Dose  . acetaminophen (TYLENOL) tablet 650 mg  650 mg Oral Q12H PRN Vida Rigger, MD   650 mg at 05/26/15 1635   Or  . acetaminophen (TYLENOL) suppository 650 mg  650 mg Rectal Q12H PRN Vida Rigger, MD      . albuterol (PROVENTIL) (2.5 MG/3ML) 0.083% nebulizer solution 3 mL  3 mL Inhalation Q6H PRN Maryann Mikhail, DO      . amantadine (SYMMETREL) capsule 100 mg  100 mg Oral BID Maryann Mikhail, DO   100 mg at 05/26/15 8119  . antiseptic oral rinse (CPC / CETYLPYRIDINIUM CHLORIDE 0.05%) solution 7 mL  7 mL Mouth Rinse BID Costin Otelia Sergeant, MD   7 mL at 05/22/15 2200  . enoxaparin (  LOVENOX) injection 40 mg  40 mg Subcutaneous Q24H Maryann Mikhail, DO   40 mg at 05/25/15 1751  . folic acid (FOLVITE) tablet 1 mg  1 mg Oral Daily Maryann Mikhail, DO   1 mg at 05/26/15 9604  . furosemide (LASIX) injection 60 mg  60 mg Intravenous BID Clydia Llano, MD   60 mg at 05/26/15 0922  . lactulose (CHRONULAC) 10 GM/15ML solution 30 g  30 g Oral QID Clydia Llano, MD   30 g at 05/26/15 0921  . mometasone-formoterol (DULERA) 200-5 MCG/ACT  inhaler 2 puff  2 puff Inhalation BID Maryann Mikhail, DO   2 puff at 05/26/15 0753  . ondansetron (ZOFRAN) tablet 4 mg  4 mg Oral Q6H PRN Maryann Mikhail, DO       Or  . ondansetron (ZOFRAN) injection 4 mg  4 mg Intravenous Q6H PRN Maryann Mikhail, DO   4 mg at 05/23/15 1720  . [START ON 05/27/2015] pantoprazole (PROTONIX) EC tablet 40 mg  40 mg Oral Daily Clydia Llano, MD      . potassium chloride SA (K-DUR,KLOR-CON) CR tablet 40 mEq  40 mEq Oral BID Clydia Llano, MD   40 mEq at 05/26/15 0923  . rifaximin (XIFAXAN) tablet 550 mg  550 mg Oral BID Clydia Llano, MD   550 mg at 05/26/15 0922  . spironolactone (ALDACTONE) tablet 100 mg  100 mg Oral BID Clydia Llano, MD   100 mg at 05/26/15 0922  . thiamine (VITAMIN B-1) tablet 100 mg  100 mg Oral Daily Maryann Mikhail, DO   100 mg at 05/26/15 5409     Discharge Medications: Please see discharge summary for a list of discharge medications.  Relevant Imaging Results:  Relevant Lab Results:   Additional Information SSN: [id number]  Venita Lick, LCSW

## 2015-05-27 LAB — COMPREHENSIVE METABOLIC PANEL
ALBUMIN: 1.8 g/dL — AB (ref 3.5–5.0)
ALT: 58 U/L — ABNORMAL HIGH (ref 14–54)
ANION GAP: 9 (ref 5–15)
AST: 122 U/L — ABNORMAL HIGH (ref 15–41)
Alkaline Phosphatase: 237 U/L — ABNORMAL HIGH (ref 38–126)
BILIRUBIN TOTAL: 12.1 mg/dL — AB (ref 0.3–1.2)
BUN: 7 mg/dL (ref 6–20)
CALCIUM: 7.8 mg/dL — AB (ref 8.9–10.3)
CHLORIDE: 101 mmol/L (ref 101–111)
CO2: 25 mmol/L (ref 22–32)
CREATININE: 0.32 mg/dL — AB (ref 0.44–1.00)
GFR calc non Af Amer: >60 mL/min (ref >60)
Glucose, Bld: 77 mg/dL (ref 65–99)
POTASSIUM: 2.9 mmol/L — AB (ref 3.5–5.1)
Sodium: 135 mmol/L (ref 135–145)
Total Protein: 5.5 g/dL — ABNORMAL LOW (ref 6.5–8.1)

## 2015-05-27 LAB — CBC
HCT: 37.6 % (ref 36.0–46.0)
Hemoglobin: 12.8 g/dL (ref 12.0–15.0)
MCH: 36.5 pg — ABNORMAL HIGH (ref 26.0–34.0)
MCHC: 34 g/dL (ref 30.0–36.0)
MCV: 107.1 fL — AB (ref 78.0–100.0)
PLATELETS: 121 10*3/uL — AB (ref 150–400)
RBC: 3.51 MIL/uL — AB (ref 3.87–5.11)
RDW: 23.3 % — AB (ref 11.5–15.5)
WBC: 17.9 10*3/uL — AB (ref 4.0–10.5)

## 2015-05-27 MED ORDER — RISPERIDONE 1 MG PO TABS
1.0000 mg | ORAL_TABLET | Freq: Every day | ORAL | Status: DC
  Administered 2015-05-27 – 2015-06-04 (×9): 1 mg via ORAL
  Filled 2015-05-27 (×10): qty 1

## 2015-05-27 MED ORDER — MAGNESIUM SULFATE 2 GM/50ML IV SOLN
2.0000 g | Freq: Once | INTRAVENOUS | Status: AC
  Administered 2015-05-27: 2 g via INTRAVENOUS
  Filled 2015-05-27: qty 50

## 2015-05-27 MED ORDER — TRAZODONE 25 MG HALF TABLET
25.0000 mg | ORAL_TABLET | Freq: Every day | ORAL | Status: DC
  Administered 2015-05-27 – 2015-06-04 (×9): 25 mg via ORAL
  Filled 2015-05-27 (×10): qty 1

## 2015-05-27 MED ORDER — KETOROLAC TROMETHAMINE 15 MG/ML IJ SOLN
15.0000 mg | Freq: Once | INTRAMUSCULAR | Status: AC
  Administered 2015-05-27: 15 mg via INTRAVENOUS
  Filled 2015-05-27: qty 1

## 2015-05-27 MED ORDER — POTASSIUM CHLORIDE 10 MEQ/100ML IV SOLN
10.0000 meq | INTRAVENOUS | Status: AC
  Administered 2015-05-27 (×6): 10 meq via INTRAVENOUS
  Filled 2015-05-27 (×9): qty 100

## 2015-05-27 MED ORDER — POTASSIUM CHLORIDE CRYS ER 20 MEQ PO TBCR: 40.0000 meq | EXTENDED_RELEASE_TABLET | Freq: Two times a day (BID) | ORAL | Status: DC

## 2015-05-27 MED ORDER — GERHARDT'S BUTT CREAM
TOPICAL_CREAM | Freq: Two times a day (BID) | CUTANEOUS | Status: DC
  Administered 2015-05-27 – 2015-05-31 (×8): via TOPICAL
  Filled 2015-05-27 (×3): qty 1

## 2015-05-27 MED ORDER — POTASSIUM CHLORIDE CRYS ER 20 MEQ PO TBCR
60.0000 meq | EXTENDED_RELEASE_TABLET | ORAL | Status: AC
  Administered 2015-05-27 (×3): 60 meq via ORAL
  Filled 2015-05-27 (×3): qty 3

## 2015-05-27 NOTE — Consult Note (Addendum)
WOC wound consult note Reason for Consult:IAD (Incontinence associated dermatitis) secondary to loose stools Wound type:Moisture associated skin damage (MASD), specifically incontinence associated dermatitis (IAD) with partial thickness skin loss in the left ischial tuberosity area (Stage 2 pressure injury) Pressure Ulcer POA: Yes Measurement:6cm x 4cm x 0.2cm Wound bed:red, moist Drainage (amount, consistency, odor) scant serous Periwound:Bilateral buttocks are inflamed, red, dry with satellite lesions at the right superior aspect Dressing procedure/placement/frequency: I will implement a constant low pressure sleep surface with low air loss therapy and topical applications of Dr. Wilford Sports butt cream (1:1:1 antifungal, zinc oxide moisture barrier and hydrocortisone cream) twice daily and PRN following episodes of incontinence.  Patient to lie side to side except for meals and with HOB elevation at or below a 30-degree angle to minimize pressure on the damaged skin over the ischial tuberosities. An indwelling bowel management system was in place earlier today, but patient expelled the device when a semisolid stool was passed. If you agree, a one time dose of IV antifungal (eg., diflucan) can be considered for more rapid resolution of the moisture-related fungal overgrowth. WOC nursing team will not follow, but will remain available to this patient, the nursing and medical teams.  Please re-consult if needed. Thanks, Ladona Mow, MSN, RN, GNP, [facility] [place]  Pager# [phone]

## 2015-05-27 NOTE — Progress Notes (Signed)
[facility] PROGRESS NOTE  Anne Black ZOX:[id number] DOB: 1960-10-16 DOA: 05/18/2015 PCP: No primary care provider on file.  Subjective: More awake and alert today, continue current lactulose dose. Reports nausea and continued RUQ pain PT recommended SNF at discharge  Hypokalemia- K+ 2.9  Diuresis with Lasix and Aldactone  Barriers to discharge: Await euvolemic status and for potassium to normalize, expect in 1-2 days.  Brief Narrative: 55 y.o. female with a history of multiple sclerosis, bipolar disorder, depression and anxiety, and ETOH dependence presented to the emergency department 3 episodes of diarrhea with nausea and vomiting in the last day. She also complained of some burning abdominal pain which is been constant, more so in the upper right quadrant. Patient has been to the emergency department twice this past week for similar presentation and refused admission on 1 occasion. Patient also states that she would like to undergo alcohol detox. In the emergency department, patient was found to have tachycardia with elevated lactic acid and leukocytosis. CT of the abdomen showed hepatic cirrhosis.  Assessment/Plan: Hepatic encephalopathy - Hepatic cirrhosis decompensated with ascites, anasarca and hepatic encephalopathy - Ammonia: 108 on 4/10, will not check ammonia level again unless her mentation is worsening- she continues to be more awake and alert each day - Continue current lactulose dose and rifaximin, improving.  Hepatic cirrhosis - Likely secondary to alcohol use. Negative for hep C, negative ferritin and ceruloplasmin. - Patient with ascites, anasarca, hypoalbuminemia, hyperbilirubinemia, transaminitis and thrombocytopenia. - On lactulose, rifaximin, Lasix and Aldactone.  Sepsis secondary to ?SBP vs UTI - Upon admission, she was tachycardic with leukocytosis, tachypnea and lactic acid of 5.4. - CT abdomen showed hepatic cirrhosis, moderate amount of ascites - EDP  did perform bedside paracentesis, cell count was not sent, we will not repeat paracentesis only for cell count. - The urine culture showed Escherichia coli and Klebsiella, received Rocephin for 7 days, discontinue on 05/26/2015 -04/18- Afebrile, lactic acid 1.9, WBC 17.9  Hypokalemia - Had severe hypokalemia down to 2.1 - 04/18 K+ 2.9  - continue to replete potassium orally and parentally, follow electrolytes  Alcohol abuse with withdrawal - Was on CIWA protocol, discontinued. - This is resolved.  RUQ Abdominal pain - Possibly due to liver capsule distention, very low suspicion for SBP - Continue pain control, antiemetics.  Prolonged QTc - Avoid QT prolonging meds  Anxiety/depression/bipolar disorder - Continue Cogentin, Risperdal, Prozac  Migraine headaches - Continue Topamax  Multiple sclerosis/ADHD - Hold Provigil due to elevated LFTs   Tobacco abuse - Continue nicotine patch  Hyponatremia - Likely in the setting of fluid overload, received Lasix 4/10 with slight improvement - Continue to monitor  Macrocytosis - Likely in the setting of alcohol abuse - Unable to check B12 and folate.  - Monitor  Thrombocytopenia - Due to liver disease, monitor, discontinue Lovenox if it goes less than 100 K  Code Status: Full Family Communication: None at bedside. Communicated plan with patient Disposition Plan: Remain inpatient, admitted to stepdown DVT prophylaxis: Lovenox   Consultants:  None  Procedures:  None  Antibiotics:  Rocephin (x7 days, completed 04/17)   Objective: Filed Vitals:   05/27/15 0528 05/27/15 0933  BP: 102/62   Pulse: 85 85  Temp: 97.6 F (36.4 C)   Resp: 22 20    Intake/Output Summary (Last 24 hours) at 05/27/15 1141 Last data filed at 05/27/15 0800  Gross per 24 hour  Intake    240 ml  Output    650 ml  Net   -410 ml   Filed Weights   05/23/15 0400 05/24/15 0500 05/27/15 0528  Weight: 94.4 kg (208 lb 1.8 oz) 90.9 kg (200  lb 6.4 oz) 92 kg (202 lb 13.2 oz)    Exam:   General:  Awake, alert, slow to respond but answering questions appropriately.  HEENT: Sclerae icterus present  Cardiovascular: RRR; 2+ bilateral pitting edema  Respiratory: CTAB, no wheezing or crackles  Abdomen: Soft and distended with RUQ tenderness; liver tender, nodular, and enlarged  Data Reviewed: Basic Metabolic Panel:  Recent Labs Lab 05/24/15 0306 05/25/15 0545 05/25/15 1619 05/26/15 0651 05/27/15 0515  NA 137 137 137 136 135  K 2.1* 2.8* 3.1* 3.8 2.9*  CL 93* 100* 103 103 101  CO2 30 27 28 23 25   GLUCOSE 95 121* 105* 87 77  BUN 7 8 7 7 7   CREATININE 0.37* 0.36* <0.30* <0.30* 0.32*  CALCIUM 7.8* 8.1* 8.1* 8.1* 7.8*  MG  --  2.4  --   --   --    Liver Function Tests:  Recent Labs Lab 05/21/15 0308 05/25/15 0545 05/26/15 0651 05/27/15 0515  AST 139* 154* 146* 122*  ALT 89* 72* 65* 58*  ALKPHOS 280* 287* 264* 237*  BILITOT 10.9* 12.0* 12.5* 12.1*  PROT 5.1* 5.9* 5.6* 5.5*  ALBUMIN 2.0* 2.1* 2.0* 1.8*   No results for input(s): LIPASE, AMYLASE in the last 168 hours.  Recent Labs Lab 05/23/15 2000  AMMONIA 96*   CBC:  Recent Labs Lab 05/21/15 0308 05/24/15 0308 05/27/15 0515  WBC 11.3* 14.5* 17.9*  HGB 11.8* 12.4 12.8  HCT 35.0* 36.0 37.6  MCV 104.8* 105.9* 107.1*  PLT 126* 110* 121*   Cardiac Enzymes: No results for input(s): CKTOTAL, CKMB, CKMBINDEX, TROPONINI in the last 168 hours. BNP (last 3 results) No results for input(s): BNP in the last 8760 hours.  ProBNP (last 3 results) No results for input(s): PROBNP in the last 8760 hours.  CBG: No results for input(s): GLUCAP in the last 168 hours.  Recent Results (from the past 240 hour(s))  Urine culture     Status: Abnormal   Collection Time: 05/18/15 10:33 AM  Result Value Ref Range Status   Specimen Description URINE, CATHETERIZED  Final   Special Requests Normal  Final   Culture (A)  Final    >=100,000 COLONIES/mL ESCHERICHIA  COLI >=100,000 COLONIES/mL KLEBSIELLA OXYTOCA    Report Status 05/21/2015 FINAL  Final   Organism ID, Bacteria ESCHERICHIA COLI (A)  Final   Organism ID, Bacteria KLEBSIELLA OXYTOCA (A)  Final      Susceptibility   Escherichia coli - MIC*    AMPICILLIN <=2 SENSITIVE Sensitive     CEFAZOLIN <=4 SENSITIVE Sensitive     CEFTRIAXONE <=1 SENSITIVE Sensitive     CIPROFLOXACIN <=0.25 SENSITIVE Sensitive     GENTAMICIN <=1 SENSITIVE Sensitive     IMIPENEM <=0.25 SENSITIVE Sensitive     NITROFURANTOIN <=16 SENSITIVE Sensitive     TRIMETH/SULFA <=20 SENSITIVE Sensitive     AMPICILLIN/SULBACTAM <=2 SENSITIVE Sensitive     PIP/TAZO <=4 SENSITIVE Sensitive     * >=100,000 COLONIES/mL ESCHERICHIA COLI   Klebsiella oxytoca - MIC*    AMPICILLIN 16 RESISTANT Resistant     CEFAZOLIN <=4 SENSITIVE Sensitive     CEFTRIAXONE <=1 SENSITIVE Sensitive     CIPROFLOXACIN <=0.25 SENSITIVE Sensitive     GENTAMICIN <=1 SENSITIVE Sensitive     IMIPENEM <=0.25 SENSITIVE Sensitive     NITROFURANTOIN 32  SENSITIVE Sensitive     TRIMETH/SULFA <=20 SENSITIVE Sensitive     AMPICILLIN/SULBACTAM 4 SENSITIVE Sensitive     PIP/TAZO <=4 SENSITIVE Sensitive     * >=100,000 COLONIES/mL KLEBSIELLA OXYTOCA  Culture, blood (routine x 2)     Status: None   Collection Time: 05/18/15 11:34 AM  Result Value Ref Range Status   Specimen Description BLOOD LEFT HAND  Final   Special Requests BOTTLES DRAWN AEROBIC ONLY 6CC  Final   Culture   Final    NO GROWTH 5 DAYS Performed at [facility]    Report Status 05/23/2015 FINAL  Final  Culture, blood (routine x 2)     Status: None   Collection Time: 05/18/15 12:07 PM  Result Value Ref Range Status   Specimen Description BLOOD LEFT HAND  Final   Special Requests BOTTLES DRAWN AEROBIC ONLY 5CC  Final   Culture   Final    NO GROWTH 5 DAYS Performed at [facility]    Report Status 05/23/2015 FINAL  Final  Culture, body fluid-bottle     Status: None   Collection  Time: 05/18/15  2:01 PM  Result Value Ref Range Status   Specimen Description FLUID  Final   Special Requests BOTTLES DRAWN AEROBIC ONLY 10CC  Final   Gram Stain   Final    RARE WBC PRESENT,BOTH PMN AND MONONUCLEAR NO ORGANISMS SEEN    Culture   Final    NO GROWTH 5 DAYS Performed at [facility]    Report Status 05/23/2015 FINAL  Final  Culture, blood (x 2)     Status: None   Collection Time: 05/18/15  4:16 PM  Result Value Ref Range Status   Specimen Description BLOOD RIGHT HAND  Final   Special Requests IN PEDIATRIC BOTTLE 2CC  Final   Culture   Final    NO GROWTH 5 DAYS Performed at [facility]    Report Status 05/23/2015 FINAL  Final  Culture, blood (x 2)     Status: None   Collection Time: 05/18/15  4:22 PM  Result Value Ref Range Status   Specimen Description BLOOD LEFT HAND  Final   Special Requests IN PEDIATRIC BOTTLE 1CC  Final   Culture   Final    NO GROWTH 5 DAYS Performed at [facility]    Report Status 05/23/2015 FINAL  Final  MRSA PCR Screening     Status: Abnormal   Collection Time: 05/18/15  6:00 PM  Result Value Ref Range Status   MRSA by PCR POSITIVE (A) NEGATIVE Final    Comment:        The GeneXpert MRSA Assay (FDA approved for NASAL specimens only), is one component of a comprehensive MRSA colonization surveillance program. It is not intended to diagnose MRSA infection nor to guide or monitor treatment for MRSA infections. RESULT CALLED TO, READ BACK BY AND VERIFIED WITH: M.HAIRSTON,RN AT 1958 ON 05/18/15 BY W.SHEA      Studies: No results found.  Scheduled Meds: . amantadine  100 mg Oral BID  . antiseptic oral rinse  7 mL Mouth Rinse BID  . enoxaparin (LOVENOX) injection  40 mg Subcutaneous Q24H  . folic acid  1 mg Oral Daily  . furosemide  60 mg Intravenous BID  . lactulose  30 g Oral QID  . mometasone-formoterol  2 puff Inhalation BID  . pantoprazole  40 mg Oral Daily  . potassium chloride  10 mEq  Intravenous Q1 Hr  x 6  . potassium chloride  60 mEq Oral Q4H  . rifaximin  550 mg Oral BID  . spironolactone  100 mg Oral BID  . thiamine  100 mg Oral Daily   Continuous Infusions:   Active Problems:   MS (multiple sclerosis) (HCC)   Bipolar disorder, curr episode mixed, severe, w/o psychotic features (HCC)   Nicotine abuse   Alcohol withdrawal (HCC)   Abdominal pain   Anxiety   Sepsis (HCC)   UTI (lower urinary tract infection)   Hepatic cirrhosis (HCC)   Hypokalemia   Encephalopathy, hepatic (HCC)   Time spent: 20 minutes  Lorin Glass PA-S wrote parts of this note. [facility] If 7PM-7AM, please contact night-coverage at [email or web address], password [facility] 05/27/2015, 11:41 AM  LOS: 9 days       Clint Lipps Pager: [phone] 05/27/2015, 3:34 PM

## 2015-05-27 NOTE — Progress Notes (Signed)
CSW met with pt re PT recommendation for SNF. Pt reports no previous SNF stay or HH. CSW explained placement process and answered questions. Pt reports agreeable to SNF placement pending insurance auth. Will initiate SNF search and f/u with offers. Once pt accepts a facility offer, the facility admissions will need to obtain auth. Pt will require a 30 day note for PASRR due to mental health diagnoses.   Wandra Feinstein, MSW, LCSW

## 2015-05-27 NOTE — Progress Notes (Signed)
Date: May 27, 2015 Chart reviewed for concurrent status and case management needs. Will continue to follow patient for changes and needs: wbc remain elevated, cultures pending Marcelle Smiling, BSN, RN, [place] [phone]

## 2015-05-27 NOTE — Progress Notes (Signed)
PROGRESS NOTE  Anne Black UJW:[id number] DOB: March 29, 1960 DOA: 05/18/2015 PCP: No primary care provider on file.   LOS: 9 days   Subjective: More awake and alert today, continue current lactulose dose. PT/OT to evaluate and treat. Started back on Lasix and increased the Aldactone dose.  Brief Narrative: 55 y.o. female with a history of multiple sclerosis, bipolar disorder, depression and anxiety, and ETOH dependence presented to the emergency department 3 episodes of diarrhea with nausea and vomiting in the last day. She also complained of some burning abdominal pain which is been constant, more so in the upper right quadrant. Patient has been to the emergency department twice this past week for similar presentation and refused admission on 1 occasion. Patient also states that she would like to undergo alcohol detox. In the emergency department, patient was found to have tachycardia with elevated lactic acid and leukocytosis. CT of the abdomen showed hepatic cirrhosis.  Assessment & Plan: Principal Problem:   Encephalopathy, hepatic (HCC) Active Problems:   MS (multiple sclerosis) (HCC)   Bipolar disorder, curr episode mixed, severe, w/o psychotic features (HCC)   Nicotine abuse   Alcohol withdrawal (HCC)   Abdominal pain   Anxiety   Sepsis (HCC)   UTI (lower urinary tract infection)   Hepatic cirrhosis (HCC)   Hypokalemia   Hepatic encephalopathy - Hepatic cirrhosis decompensated with ascites, anasarca and hepatic encephalopathy - Ammonia: 108 on 4/10, we'll not check ammonia level again unless her mentation is worsening. - Continue current lactulose dose and rifaximin, improving.  Hepatic cirrhosis -Likely secondary to alcohol use. Negative for hep C, negative ferritin and ceruloplasmin. -Patient with ascites, anasarca, hypoalbuminemia, hyperbilirubinemia, transaminitis and from cytopenia. -On lactulose, rifaximin, Lasix and Aldactone.  Sepsis secondary to ?SBP vs UTI -  Upon admission, she was tachycardic with leukocytosis, tachypnea and lactic acid of 5.4. - CT abdomen showed hepatic cirrhosis, moderate amount of ascites - EDP did perform bedside paracentesis, cell count was not sent, we will not repeat paracentesis only for cell count. - The urine culture showed Escherichia coli and Klebsiella, received Rocephin now for 7 days, discontinue on 05/26/2015.  Hypokalemia - 04/11 potassium was 2.8 - Had severe hypokalemia down to 2.1, she was not getting oral supplementation.  Alcohol abuse with withdrawal - Was on CIWA protocol, discontinued. - This is resolved.  Abdominal pain with nausea and vomiting diarrhea - Possibly due to the above versus withdrawal symptoms - Continue pain control, antiemetics  Prolonged QTc - Avoid QT prolonging meds  Anxiety/depression/bipolar disorder - Continue Cogentin, Risperdal, Prozac  Migraine headaches - Continue Topamax  Multiple sclerosis/ADHD - Hold Provigil due to elevated LFTs   Tobacco abuse - Continue nicotine patch  Hyponatremia - Likely in the setting of fluid overload, received Lasix 4/10 with slight improvement - Continue to monitor  Macrocytosis - Likely in the setting of alcohol abuse - Unable to check B12 and folate.  - Monitor  Thrombocytopenia - Due to liver disease, monitor, discontinue Lovenox if it goes less than 100 K   DVT prophylaxis: Lovenox Code Status: Full Condition: Guarded Family Communication: None at bedside. Communicated plan with patient. Disposition Plan: Admitted to stepdown.  Antimicrobials: - Zosyn 4/9 >>   Objective: Filed Vitals:   05/26/15 2108 05/27/15 0528 05/27/15 0933 05/27/15 1447  BP: 106/61 102/62  112/66  Pulse: 89 85 85 90  Temp: 97.8 F (36.6 C) 97.6 F (36.4 C)  97.7 F (36.5 C)  TempSrc: Oral Oral  Oral  Resp: 20  22 20 20   Height:      Weight:  92 kg (202 lb 13.2 oz)    SpO2: 95% 99% 95% 98%    Intake/Output Summary (Last 24  hours) at 05/27/15 1543 Last data filed at 05/27/15 0800  Gross per 24 hour  Intake    240 ml  Output    200 ml  Net     40 ml   Filed Weights   05/23/15 0400 05/24/15 0500 05/27/15 0528  Weight: 94.4 kg (208 lb 1.8 oz) 90.9 kg (200 lb 6.4 oz) 92 kg (202 lb 13.2 oz)    Examination: Constitutional: anxious  Filed Vitals:   05/26/15 2108 05/27/15 0528 05/27/15 0933 05/27/15 1447  BP: 106/61 102/62  112/66  Pulse: 89 85 85 90  Temp: 97.8 F (36.6 C) 97.6 F (36.4 C)  97.7 F (36.5 C)  TempSrc: Oral Oral  Oral  Resp: 20 22 20 20   Height:      Weight:  92 kg (202 lb 13.2 oz)    SpO2: 95% 99% 95% 98%   Eyes: PERRL, + sclerae icterus  ENMT: Mucous membranes are moist.  Respiratory: clear to auscultation bilaterally, no wheezing, no crackles. Abdominal breathing pattern  Cardiovascular: Regular rate and rhythm, no murmurs / rubs / gallops. 2+ Bilateral extremity pitting edema. pedal pulses present.  Abdomen: no tenderness, no masses palpated. Bowel sounds positive. Distended  Neurologic: more awake, but still confused. Speech is Unintelligible. Nonfocal  Data Reviewed: I have personally reviewed following labs and imaging studies  CBC:  Recent Labs Lab 05/21/15 0308 05/24/15 0308 05/27/15 0515  WBC 11.3* 14.5* 17.9*  HGB 11.8* 12.4 12.8  HCT 35.0* 36.0 37.6  MCV 104.8* 105.9* 107.1*  PLT 126* 110* 121*   Basic Metabolic Panel:  Recent Labs Lab 05/24/15 0306 05/25/15 0545 05/25/15 1619 05/26/15 0651 05/27/15 0515  NA 137 137 137 136 135  K 2.1* 2.8* 3.1* 3.8 2.9*  CL 93* 100* 103 103 101  CO2 30 27 28 23 25   GLUCOSE 95 121* 105* 87 77  BUN 7 8 7 7 7   CREATININE 0.37* 0.36* <0.30* <0.30* 0.32*  CALCIUM 7.8* 8.1* 8.1* 8.1* 7.8*  MG  --  2.4  --   --   --    GFR: Estimated Creatinine Clearance: 83.9 mL/min (by C-G formula based on Cr of 0.32). Liver Function Tests:  Recent Labs Lab 05/21/15 0308 05/25/15 0545 05/26/15 0651 05/27/15 0515  AST  139* 154* 146* 122*  ALT 89* 72* 65* 58*  ALKPHOS 280* 287* 264* 237*  BILITOT 10.9* 12.0* 12.5* 12.1*  PROT 5.1* 5.9* 5.6* 5.5*  ALBUMIN 2.0* 2.1* 2.0* 1.8*   No results for input(s): LIPASE, AMYLASE in the last 168 hours.  Recent Labs Lab 05/23/15 2000  AMMONIA 96*   Coagulation Profile:  Recent Labs Lab 05/21/15 0308  INR 1.37   Urine analysis:    Component Value Date/Time   COLORURINE ORANGE* 05/18/2015 1033   APPEARANCEUR CLOUDY* 05/18/2015 1033   LABSPEC 1.021 05/18/2015 1033   PHURINE 6.0 05/18/2015 1033   GLUCOSEU NEGATIVE 05/18/2015 1033   HGBUR LARGE* 05/18/2015 1033   BILIRUBINUR LARGE* 05/18/2015 1033   KETONESUR 40* 05/18/2015 1033   PROTEINUR 30* 05/18/2015 1033   UROBILINOGEN 0.2 09/14/2011 2141   NITRITE POSITIVE* 05/18/2015 1033   LEUKOCYTESUR MODERATE* 05/18/2015 1033   Sepsis Labs: Invalid input(s): PROCALCITONIN, LACTICIDVEN  Recent Results (from the past 240 hour(s))  Urine culture     Status:  Abnormal   Collection Time: 05/18/15 10:33 AM  Result Value Ref Range Status   Specimen Description URINE, CATHETERIZED  Final   Special Requests Normal  Final   Culture (A)  Final    >=100,000 COLONIES/mL ESCHERICHIA COLI >=100,000 COLONIES/mL KLEBSIELLA OXYTOCA    Report Status 05/21/2015 FINAL  Final   Organism ID, Bacteria ESCHERICHIA COLI (A)  Final   Organism ID, Bacteria KLEBSIELLA OXYTOCA (A)  Final      Susceptibility   Escherichia coli - MIC*    AMPICILLIN <=2 SENSITIVE Sensitive     CEFAZOLIN <=4 SENSITIVE Sensitive     CEFTRIAXONE <=1 SENSITIVE Sensitive     CIPROFLOXACIN <=0.25 SENSITIVE Sensitive     GENTAMICIN <=1 SENSITIVE Sensitive     IMIPENEM <=0.25 SENSITIVE Sensitive     NITROFURANTOIN <=16 SENSITIVE Sensitive     TRIMETH/SULFA <=20 SENSITIVE Sensitive     AMPICILLIN/SULBACTAM <=2 SENSITIVE Sensitive     PIP/TAZO <=4 SENSITIVE Sensitive     * >=100,000 COLONIES/mL ESCHERICHIA COLI   Klebsiella oxytoca - MIC*     AMPICILLIN 16 RESISTANT Resistant     CEFAZOLIN <=4 SENSITIVE Sensitive     CEFTRIAXONE <=1 SENSITIVE Sensitive     CIPROFLOXACIN <=0.25 SENSITIVE Sensitive     GENTAMICIN <=1 SENSITIVE Sensitive     IMIPENEM <=0.25 SENSITIVE Sensitive     NITROFURANTOIN 32 SENSITIVE Sensitive     TRIMETH/SULFA <=20 SENSITIVE Sensitive     AMPICILLIN/SULBACTAM 4 SENSITIVE Sensitive     PIP/TAZO <=4 SENSITIVE Sensitive     * >=100,000 COLONIES/mL KLEBSIELLA OXYTOCA  Culture, blood (routine x 2)     Status: None   Collection Time: 05/18/15 11:34 AM  Result Value Ref Range Status   Specimen Description BLOOD LEFT HAND  Final   Special Requests BOTTLES DRAWN AEROBIC ONLY 6CC  Final   Culture   Final    NO GROWTH 5 DAYS Performed at [facility]    Report Status 05/23/2015 FINAL  Final  Culture, blood (routine x 2)     Status: None   Collection Time: 05/18/15 12:07 PM  Result Value Ref Range Status   Specimen Description BLOOD LEFT HAND  Final   Special Requests BOTTLES DRAWN AEROBIC ONLY 5CC  Final   Culture   Final    NO GROWTH 5 DAYS Performed at [facility]    Report Status 05/23/2015 FINAL  Final  Culture, body fluid-bottle     Status: None   Collection Time: 05/18/15  2:01 PM  Result Value Ref Range Status   Specimen Description FLUID  Final   Special Requests BOTTLES DRAWN AEROBIC ONLY 10CC  Final   Gram Stain   Final    RARE WBC PRESENT,BOTH PMN AND MONONUCLEAR NO ORGANISMS SEEN    Culture   Final    NO GROWTH 5 DAYS Performed at [facility]    Report Status 05/23/2015 FINAL  Final  Culture, blood (x 2)     Status: None   Collection Time: 05/18/15  4:16 PM  Result Value Ref Range Status   Specimen Description BLOOD RIGHT HAND  Final   Special Requests IN PEDIATRIC BOTTLE [facility]  Final   Culture   Final    NO GROWTH 5 DAYS Performed at [facility]    Report Status 05/23/2015 FINAL  Final  Culture, blood (x 2)     Status: None   Collection Time:  05/18/15  4:22 PM  Result Value Ref Range Status  Specimen Description BLOOD LEFT HAND  Final   Special Requests IN PEDIATRIC BOTTLE 1CC  Final   Culture   Final    NO GROWTH 5 DAYS Performed at [facility]    Report Status 05/23/2015 FINAL  Final  MRSA PCR Screening     Status: Abnormal   Collection Time: 05/18/15  6:00 PM  Result Value Ref Range Status   MRSA by PCR POSITIVE (A) NEGATIVE Final    Comment:        The GeneXpert MRSA Assay (FDA approved for NASAL specimens only), is one component of a comprehensive MRSA colonization surveillance program. It is not intended to diagnose MRSA infection nor to guide or monitor treatment for MRSA infections. RESULT CALLED TO, READ BACK BY AND VERIFIED WITH: M.HAIRSTON,RN AT 1958 ON 05/18/15 BY W.[facility]       Radiology Studies: No results found.   Scheduled Meds: . amantadine  100 mg Oral BID  . antiseptic oral rinse  7 mL Mouth Rinse BID  . enoxaparin (LOVENOX) injection  40 mg Subcutaneous Q24H  . folic acid  1 mg Oral Daily  . furosemide  60 mg Intravenous BID  . lactulose  30 g Oral QID  . mometasone-formoterol  2 puff Inhalation BID  . pantoprazole  40 mg Oral Daily  . potassium chloride  60 mEq Oral Q4H  . rifaximin  550 mg Oral BID  . spironolactone  100 mg Oral BID  . thiamine  100 mg Oral Daily    Jalisia Puchalski A, MD [facility] Pager [phone]  If 7PM-7AM, please contact night-coverage [email or web address] Password [facility] 05/27/2015, 3:43 PM

## 2015-05-28 ENCOUNTER — Inpatient Hospital Stay (HOSPITAL_COMMUNITY): Payer: Managed Care, Other (non HMO)

## 2015-05-28 DIAGNOSIS — F10231 Alcohol dependence with withdrawal delirium: Secondary | ICD-10-CM

## 2015-05-28 LAB — COMPREHENSIVE METABOLIC PANEL
ALK PHOS: 260 U/L — AB (ref 38–126)
ALT: 56 U/L — ABNORMAL HIGH (ref 14–54)
AST: 158 U/L — AB (ref 15–41)
Albumin: 1.8 g/dL — ABNORMAL LOW (ref 3.5–5.0)
Anion gap: 10 (ref 5–15)
BUN: 7 mg/dL (ref 6–20)
CALCIUM: 7.9 mg/dL — AB (ref 8.9–10.3)
CHLORIDE: 97 mmol/L — AB (ref 101–111)
CO2: 22 mmol/L (ref 22–32)
Glucose, Bld: 74 mg/dL (ref 65–99)
Potassium: 4.8 mmol/L (ref 3.5–5.1)
Sodium: 129 mmol/L — ABNORMAL LOW (ref 135–145)
Total Bilirubin: 12.1 mg/dL — ABNORMAL HIGH (ref 0.3–1.2)
Total Protein: 5.1 g/dL — ABNORMAL LOW (ref 6.5–8.1)

## 2015-05-28 LAB — URINALYSIS, ROUTINE W REFLEX MICROSCOPIC
Glucose, UA: NEGATIVE mg/dL
Ketones, ur: NEGATIVE mg/dL
NITRITE: NEGATIVE
Protein, ur: NEGATIVE mg/dL
SPECIFIC GRAVITY, URINE: 1.013 (ref 1.005–1.030)
pH: 6 (ref 5.0–8.0)

## 2015-05-28 LAB — MAGNESIUM: Magnesium: 1.9 mg/dL (ref 1.7–2.4)

## 2015-05-28 LAB — CBC
HEMATOCRIT: 37 % (ref 36.0–46.0)
Hemoglobin: 12.7 g/dL (ref 12.0–15.0)
MCH: 36.7 pg — ABNORMAL HIGH (ref 26.0–34.0)
MCHC: 34.3 g/dL (ref 30.0–36.0)
MCV: 106.9 fL — AB (ref 78.0–100.0)
PLATELETS: 125 10*3/uL — AB (ref 150–400)
RBC: 3.46 MIL/uL — ABNORMAL LOW (ref 3.87–5.11)
RDW: 22.6 % — AB (ref 11.5–15.5)
WBC: 18.1 10*3/uL — AB (ref 4.0–10.5)

## 2015-05-28 LAB — BASIC METABOLIC PANEL
ANION GAP: 11 (ref 5–15)
BUN: 7 mg/dL (ref 6–20)
CO2: 23 mmol/L (ref 22–32)
Calcium: 8.3 mg/dL — ABNORMAL LOW (ref 8.9–10.3)
Chloride: 99 mmol/L — ABNORMAL LOW (ref 101–111)
Creatinine, Ser: 0.36 mg/dL — ABNORMAL LOW (ref 0.44–1.00)
GFR calc Af Amer: >60 mL/min (ref >60)
GFR calc non Af Amer: >60 mL/min (ref >60)
GLUCOSE: 94 mg/dL (ref 65–99)
Potassium: 3.7 mmol/L (ref 3.5–5.1)
Sodium: 133 mmol/L — ABNORMAL LOW (ref 135–145)

## 2015-05-28 LAB — URINE MICROSCOPIC-ADD ON
Bacteria, UA: NONE SEEN
Squamous Epithelial / LPF: NONE SEEN

## 2015-05-28 MED ORDER — DIPHENHYDRAMINE HCL 25 MG PO CAPS
25.0000 mg | ORAL_CAPSULE | Freq: Three times a day (TID) | ORAL | Status: DC | PRN
  Administered 2015-05-28 – 2015-06-03 (×3): 25 mg via ORAL
  Filled 2015-05-28 (×3): qty 1

## 2015-05-28 MED ORDER — BENZONATATE 100 MG PO CAPS
100.0000 mg | ORAL_CAPSULE | Freq: Three times a day (TID) | ORAL | Status: DC | PRN
  Administered 2015-05-28 – 2015-05-30 (×4): 100 mg via ORAL
  Filled 2015-05-28 (×4): qty 1

## 2015-05-28 MED ORDER — TRAMADOL HCL 50 MG PO TABS
50.0000 mg | ORAL_TABLET | Freq: Four times a day (QID) | ORAL | Status: DC | PRN
  Administered 2015-05-28 – 2015-05-30 (×4): 50 mg via ORAL
  Filled 2015-05-28 (×4): qty 1

## 2015-05-28 MED ORDER — FUROSEMIDE 40 MG PO TABS
40.0000 mg | ORAL_TABLET | Freq: Every day | ORAL | Status: DC
  Administered 2015-05-28 – 2015-05-30 (×3): 40 mg via ORAL
  Filled 2015-05-28 (×4): qty 1

## 2015-05-28 MED ORDER — SPIRONOLACTONE 100 MG PO TABS
100.0000 mg | ORAL_TABLET | Freq: Every day | ORAL | Status: DC
Start: 2015-05-29 — End: 2015-05-31
  Administered 2015-05-29 – 2015-05-30 (×2): 100 mg via ORAL
  Filled 2015-05-28 (×3): qty 1

## 2015-05-28 MED ORDER — LACTULOSE 10 GM/15ML PO SOLN
30.0000 g | Freq: Three times a day (TID) | ORAL | Status: DC
  Administered 2015-05-29: 30 g via ORAL
  Filled 2015-05-28 (×10): qty 45

## 2015-05-28 MED ORDER — PRO-STAT SUGAR FREE PO LIQD
30.0000 mL | Freq: Two times a day (BID) | ORAL | Status: DC
  Administered 2015-05-31 – 2015-06-05 (×4): 30 mL via ORAL
  Filled 2015-05-28 (×18): qty 30

## 2015-05-28 NOTE — Progress Notes (Signed)
Initial Nutrition Assessment  DOCUMENTATION CODES:   Obesity unspecified  INTERVENTION:  - Will order 30 mL Prostat BID, each supplement provides 100 kcal and 15 grams of protein. - Continue Heart Healthy diet - RD will continue to monitor for needs  NUTRITION DIAGNOSIS:   Inadequate oral intake related to other (see comment) (food preferences compared to current diet order) as evidenced by per patient/family report, meal completion < 50%.  GOAL:   Patient will meet greater than or equal to 90% of their needs  MONITOR:   PO intake, Supplement acceptance, Weight trends, Labs, Skin, I & O's  REASON FOR ASSESSMENT:   LOS  ASSESSMENT:   55 y.o. female with a history of multiple sclerosis, bipolar disorder, depression and anxiety, presents emergency department with complaints of abdominal pain, nausea and vomiting, diarrhea. Patient has been to the emergency department twice this past week for similar presentation. She was seen on 05/13/2015 had ultrasound of the abdomen at that time refused admission. Patient then presented again 2 days ago for similar symptoms and left without being seen. She returned to the emergency department today with similar complaints and states she's had approximately 3 episodes of diarrhea with nausea and vomiting in the last day. She reports feeling "shaky". She's also complained of some burning abdominal pain which has been constant, more so in the upper right quadrant. The patient denied any urinary symptoms. Patient also states that she would like to undergo alcohol detox. She cannot quantify the amount of alcohol that she consumes it states that things have been very difficult for her recently. She denies any suicidal or homicidal ideations.  Pt seen for LOS (10 days). BMI indicates obesity/borderline morbid obesity. Per chart review, pt has been consuming 10-35% of meals for the past 4 days. She states she ate a few bites of breakfast this AM but that she  has been unable to eat much since admission due to food preferences compared to allowed foods on current diet order. Pt reports that she has been following an Atkins-based diet for several years and has experienced frustration in inability to order preferred foods such as whole eggs, Reuben and pastrami sandwiches, cheese, etc. Talked with pt about Heart Healthy diet and the reason for order; talked with her about favorite foods that are in line with this diet order. Pt expresses ongoing frustration with diet order and states that she does not like to be told what to do or how to eat. RD feels that Heart Healthy diet is warranted for this pt but advised her to talk with MD should she continue to feel frustration concerning diet order. Placed lunch order for pt at her request, all foods in compliance with current diet order: grilled chicken sandwich, pinto beans, collard greens, sliced peaches, sweet tea. Pt highly appreciative.  Unable to obtain information from PTA as pt's focus was on current frustrations surrounding diet order and timeliness of lunch being received. Physical assessment shows no muscle or fat wasting. Moderate edema present and noted ascites to abdominal area. MD note from 4/18 @ 1543 states pt with hepatic cirrhosis secondary to alcohol abuse with associated ascites, anasarca, and hepatic encephalopathy.  Pt not meeting needs; will order Prostat to assist in meeting protein needs. Medications reviewed; 40 mg oral Lasix/day, 1 mg folic acid/day, 60 mEq oral KCl every 4 hours, 100 mg thiamine/day. Labs reviewed; Na: 129 mmol/L, Cl: 97 mmol/L, creatinine low, Ca: 7.9 mg/dL, WBC elevated and trending up today, LFTs elevated.  Diet Order:  Diet Heart Room service appropriate?: Yes; Fluid consistency:: Thin  Skin:  Wound (see comment) (Stage 2 ischial tuberosity)  Last BM:  4/19  Height:   Ht Readings from Last 1 Encounters:  05/18/15  (1.575 m)    Weight:   Wt Readings  from Last 1 Encounters:  05/28/15 201 lb 15.1 oz (91.6 kg)    Ideal Body Weight:  50 kg (kg)  BMI:  Body mass index is 36.93 kg/(m^2).  Estimated Nutritional Needs:   Kcal:  1500-1700  Protein:  85-95 grams  Fluid:  </= 1.5 L/day  EDUCATION NEEDS:   No education needs identified at this time     Trenton Gammon, RD, LDN Inpatient Clinical Dietitian Pager # [phone] After hours/weekend pager # [phone]

## 2015-05-28 NOTE — Progress Notes (Signed)
Physical Therapy Treatment Patient Details Name: Anne Black MRN: [id number] DOB: [birth year] Today's Date: 05/28/2015    History of Present Illness 55 y.o. female with a history of multiple sclerosis, bipolar disorder, depression and anxiety, and ETOH dependence presented with diarrhea, nausea and vomiting and admitted for Hepatic cirrhosis/elevated LFTs/decompensated with ascites as well as hepatic encephalopathy    PT Comments    Assisted pt OOB to amb a limited distance + 2 assist.  Pt demonstarted increased anxiety with increased activity/fearful of falling.  Weak and unsteady.  Pt will need ST Rehab at SNF prior to returning home.   Follow Up Recommendations  SNF     Equipment Recommendations       Recommendations for Other Services       Precautions / Restrictions Precautions Precautions: Fall Restrictions Weight Bearing Restrictions: No    Mobility  Bed Mobility Overal bed mobility: Needs Assistance Bed Mobility: Supine to Sit     Supine to sit: Mod assist     General bed mobility comments: increased time and repeat VC's to stay on task and encourage self assist level  Transfers Overall transfer level: Needs assistance Equipment used: Rolling walker (2 wheeled)             General transfer comment: max VC's to encourage self assist and to decrease anxiety/fear of falling  Ambulation/Gait Ambulation/Gait assistance: Max assist;+2 physical assistance;+2 safety/equipment Ambulation Distance (Feet): 14 Feet Assistive device: Rolling walker (2 wheeled) Gait Pattern/deviations: Step-to pattern;Decreased step length - right;Decreased step length - left     General Gait Details: severe posterior lean/pushing.  + 2 assist for safety with recliner following behind.  Max anxiety when pt got to doorway.  Required even more positive reinforcement she was not going to fall.  HIGH ANXIETY.    Stairs            Wheelchair Mobility    Modified Rankin  (Stroke Patients Only)       Balance                                    Cognition Arousal/Alertness: Awake/alert Behavior During Therapy: Anxious                        Exercises      General Comments        Pertinent Vitals/Pain Pain Assessment: No/denies pain    Home Living                      Prior Function            PT Goals (current goals can now be found in the care plan section) Progress towards PT goals: Progressing toward goals    Frequency  Min 3X/week    PT Plan Current plan remains appropriate    Co-evaluation             End of Session Equipment Utilized During Treatment: Gait belt Activity Tolerance: Other (comment) (anxiety limited further activity) Patient left: in chair;with call bell/phone within reach;with chair alarm set     Time: 1115-1140 PT Time Calculation (min) (ACUTE ONLY): 25 min  Charges:  $Gait Training: 8-22 mins $Therapeutic Activity: 8-22 mins                    G Codes:      Felecia Shelling  PTA  [facility]  Acute  Rehab Pager      [phone]

## 2015-05-28 NOTE — Progress Notes (Addendum)
PROGRESS NOTE  GICELLE MATHERLY ZOX:[id number] DOB: January 06, 1961 DOA: 05/18/2015 PCP: No primary care provider on file.   LOS: 10 days   Subjective:  aaox3 today, denies pain, want to eat regular diet,  daughter in room  Brief Narrative: 55 y.o. female with a history of multiple sclerosis, bipolar disorder, depression and anxiety, and ETOH dependence presented to the emergency department 3 episodes of diarrhea with nausea and vomiting in the last day. She also complained of some burning abdominal pain which is been constant, more so in the upper right quadrant. Patient has been to the emergency department twice this past week for similar presentation and refused admission on 1 occasion. Patient also states that she would like to undergo alcohol detox. In the emergency department, patient was found to have tachycardia with elevated lactic acid and leukocytosis. CT of the abdomen showed hepatic cirrhosis.  Assessment & Plan: Principal Problem:   Encephalopathy, hepatic (HCC) Active Problems:   MS (multiple sclerosis) (HCC)   Bipolar disorder, curr episode mixed, severe, w/o psychotic features (HCC)   Nicotine abuse   Alcohol withdrawal (HCC)   Abdominal pain   Anxiety   Sepsis (HCC)   UTI (lower urinary tract infection)   Hepatic cirrhosis (HCC)   Hypokalemia  Hyponatremia:   acute drop from yesterday, repeat bmp, na better, could be lab variation. Repeat bmp in am  Persistent leukocytosis, no fever, will repeat ua, monitor  Hepatic encephalopathy - Hepatic cirrhosis decompensated with ascites, anasarca and hepatic encephalopathy - Ammonia: 108 on 4/10, we'll not check ammonia level again unless her mentation is worsening. - Continue current lactulose dose , keep bm 3-4 time a day, continue rifaximin, improving.  Hepatic cirrhosis -Likely secondary to alcohol use. Negative for hep C, negative ferritin and ceruloplasmin. -Patient with ascites, anasarca, hypoalbuminemia,  hyperbilirubinemia, transaminitis and from cytopenia. -On lactulose, rifaximin, Lasix and Aldactone. -need to follow up with GI on outpatient basis  Sepsis secondary to ?SBP vs UTI - Upon admission, she was tachycardic with leukocytosis, tachypnea and lactic acid of 5.4. - CT abdomen showed hepatic cirrhosis, moderate amount of ascites - EDP did perform bedside paracentesis, cell count was not sent, we will not repeat paracentesis only for cell count. No ab pain,  - The urine culture showed Escherichia coli and Klebsiella, received zosyn then Rocephin, last dose of abx on 05/26/2015.  Hypokalemia - 04/11 potassium was 2.8 - Had severe hypokalemia down to 2.1, she was not getting oral supplementation. -check mag  Alcohol abuse with withdrawal - Was on CIWA protocol, discontinued. - This is resolved.  Abdominal pain with nausea and vomiting diarrhea - Possibly due to the above versus withdrawal symptoms - Continue pain control, antiemetics  Prolonged QTc, QTc 689 on admission - Avoid QT prolonging meds, repeat ekg in am, d/c tele if QWTc return to normal range  Anxiety/depression/bipolar disorder - Continue Cogentin, Risperdal, Prozac  Migraine headaches - Continue Topamax  Multiple sclerosis/ADHD - Hold Provigil due to elevated LFTs  Need outpatient neurology follow up, patient was last seen at [facility] neurology Dr Allena Earing for MS.  Tobacco abuse - Continue nicotine patch  Macrocytosis - Likely in the setting of alcohol abuse - check B12 and folate.  - Monitor  Thrombocytopenia - Due to liver disease, monitor, discontinue Lovenox if it goes less than 100 K   DVT prophylaxis: Lovenox Code Status: Full Condition: Guarded Family Communication: . Communicated plan with patient and daughter in room on 4/19 Disposition Plan: snf when bed  available  Antimicrobials: - Zosyn 4/9 >> 4/12 Rocephin from 4/12 -4/17   Objective: Filed Vitals:   05/27/15 1447  05/27/15 2138 05/28/15 0601 05/28/15 0757  BP: 112/66 108/73 96/57 103/76  Pulse: 90 86 89 89  Temp: 97.7 F (36.5 C) 98.3 F (36.8 C) 97.8 F (36.6 C) 97.9 F (36.6 C)  TempSrc: Oral Oral Oral Oral  Resp: 20 22 20 20   Height:      Weight:   91.6 kg (201 lb 15.1 oz)   SpO2: 98% 96% 95% 92%    Intake/Output Summary (Last 24 hours) at 05/28/15 0834 Last data filed at 05/28/15 0601  Gross per 24 hour  Intake    480 ml  Output    550 ml  Net    -70 ml   Filed Weights   05/24/15 0500 05/27/15 0528 05/28/15 0601  Weight: 90.9 kg (200 lb 6.4 oz) 92 kg (202 lb 13.2 oz) 91.6 kg (201 lb 15.1 oz)    Examination: Constitutional: anxious  Filed Vitals:   05/27/15 1447 05/27/15 2138 05/28/15 0601 05/28/15 0757  BP: 112/66 108/73 96/57 103/76  Pulse: 90 86 89 89  Temp: 97.7 F (36.5 C) 98.3 F (36.8 C) 97.8 F (36.6 C) 97.9 F (36.6 C)  TempSrc: Oral Oral Oral Oral  Resp: 20 22 20 20   Height:      Weight:   91.6 kg (201 lb 15.1 oz)   SpO2: 98% 96% 95% 92%   Eyes: PERRL, + sclerae icterus  ENMT: Mucous membranes are moist.  Respiratory: clear to auscultation bilaterally, no wheezing, no crackles. Abdominal breathing pattern  Cardiovascular: Regular rate and rhythm, no murmurs / rubs / gallops. 2+ Bilateral extremity pitting edema. pedal pulses present.  Abdomen: no tenderness, no masses palpated. Bowel sounds positive. Distended  Neurologic: aaox3, but does not has insight in her disease process. Nonfocal  Data Reviewed: I have personally reviewed following labs and imaging studies  CBC:  Recent Labs Lab 05/24/15 0308 05/27/15 0515 05/28/15 0517  WBC 14.5* 17.9* 18.1*  HGB 12.4 12.8 12.7  HCT 36.0 37.6 37.0  MCV 105.9* 107.1* 106.9*  PLT 110* 121* 125*   Basic Metabolic Panel:  Recent Labs Lab 05/25/15 0545 05/25/15 1619 05/26/15 0651 05/27/15 0515 05/28/15 0517  NA 137 137 136 135 129*  K 2.8* 3.1* 3.8 2.9* 4.8  CL 100* 103 103 101 97*  CO2 27 28  23 25 22   GLUCOSE 121* 105* 87 77 74  BUN 8 7 7 7 7   CREATININE 0.36* <0.30* <0.30* 0.32* <0.30*  CALCIUM 8.1* 8.1* 8.1* 7.8* 7.9*  MG 2.4  --   --   --  1.9   GFR: CrCl cannot be calculated (Patient has no serum creatinine result on file.). Liver Function Tests:  Recent Labs Lab 05/25/15 0545 05/26/15 0651 05/27/15 0515 05/28/15 0517  AST 154* 146* 122* 158*  ALT 72* 65* 58* 56*  ALKPHOS 287* 264* 237* 260*  BILITOT 12.0* 12.5* 12.1* 12.1*  PROT 5.9* 5.6* 5.5* 5.1*  ALBUMIN 2.1* 2.0* 1.8* 1.8*   No results for input(s): LIPASE, AMYLASE in the last 168 hours.  Recent Labs Lab 05/23/15 2000  AMMONIA 96*   Coagulation Profile: No results for input(s): INR, PROTIME in the last 168 hours. Urine analysis:    Component Value Date/Time   COLORURINE ORANGE* 05/18/2015 1033   APPEARANCEUR CLOUDY* 05/18/2015 1033   LABSPEC 1.021 05/18/2015 1033   PHURINE 6.0 05/18/2015 1033   GLUCOSEU NEGATIVE 05/18/2015  1033   HGBUR LARGE* 05/18/2015 1033   BILIRUBINUR LARGE* 05/18/2015 1033   KETONESUR 40* 05/18/2015 1033   PROTEINUR 30* 05/18/2015 1033   UROBILINOGEN 0.2 09/14/2011 2141   NITRITE POSITIVE* 05/18/2015 1033   LEUKOCYTESUR MODERATE* 05/18/2015 1033   Sepsis Labs: Invalid input(s): PROCALCITONIN, LACTICIDVEN  Recent Results (from the past 240 hour(s))  Urine culture     Status: Abnormal   Collection Time: 05/18/15 10:33 AM  Result Value Ref Range Status   Specimen Description URINE, CATHETERIZED  Final   Special Requests Normal  Final   Culture (A)  Final    >=100,000 COLONIES/mL ESCHERICHIA COLI >=100,000 COLONIES/mL KLEBSIELLA OXYTOCA    Report Status 05/21/2015 FINAL  Final   Organism ID, Bacteria ESCHERICHIA COLI (A)  Final   Organism ID, Bacteria KLEBSIELLA OXYTOCA (A)  Final      Susceptibility   Escherichia coli - MIC*    AMPICILLIN <=2 SENSITIVE Sensitive     CEFAZOLIN <=4 SENSITIVE Sensitive     CEFTRIAXONE <=1 SENSITIVE Sensitive     CIPROFLOXACIN  <=0.25 SENSITIVE Sensitive     GENTAMICIN <=1 SENSITIVE Sensitive     IMIPENEM <=0.25 SENSITIVE Sensitive     NITROFURANTOIN <=16 SENSITIVE Sensitive     TRIMETH/SULFA <=20 SENSITIVE Sensitive     AMPICILLIN/SULBACTAM <=2 SENSITIVE Sensitive     PIP/TAZO <=4 SENSITIVE Sensitive     * >=100,000 COLONIES/mL ESCHERICHIA COLI   Klebsiella oxytoca - MIC*    AMPICILLIN 16 RESISTANT Resistant     CEFAZOLIN <=4 SENSITIVE Sensitive     CEFTRIAXONE <=1 SENSITIVE Sensitive     CIPROFLOXACIN <=0.25 SENSITIVE Sensitive     GENTAMICIN <=1 SENSITIVE Sensitive     IMIPENEM <=0.25 SENSITIVE Sensitive     NITROFURANTOIN 32 SENSITIVE Sensitive     TRIMETH/SULFA <=20 SENSITIVE Sensitive     AMPICILLIN/SULBACTAM 4 SENSITIVE Sensitive     PIP/TAZO <=4 SENSITIVE Sensitive     * >=100,000 COLONIES/mL KLEBSIELLA OXYTOCA  Culture, blood (routine x 2)     Status: None   Collection Time: 05/18/15 11:34 AM  Result Value Ref Range Status   Specimen Description BLOOD LEFT HAND  Final   Special Requests BOTTLES DRAWN AEROBIC ONLY 6CC  Final   Culture   Final    NO GROWTH 5 DAYS Performed at [facility]    Report Status 05/23/2015 FINAL  Final  Culture, blood (routine x 2)     Status: None   Collection Time: 05/18/15 12:07 PM  Result Value Ref Range Status   Specimen Description BLOOD LEFT HAND  Final   Special Requests BOTTLES DRAWN AEROBIC ONLY 5CC  Final   Culture   Final    NO GROWTH 5 DAYS Performed at [facility]    Report Status 05/23/2015 FINAL  Final  Culture, body fluid-bottle     Status: None   Collection Time: 05/18/15  2:01 PM  Result Value Ref Range Status   Specimen Description FLUID  Final   Special Requests BOTTLES DRAWN AEROBIC ONLY 10CC  Final   Gram Stain   Final    RARE WBC PRESENT,BOTH PMN AND MONONUCLEAR NO ORGANISMS SEEN    Culture   Final    NO GROWTH 5 DAYS Performed at [facility]    Report Status 05/23/2015 FINAL  Final  Culture, blood (x 2)      Status: None   Collection Time: 05/18/15  4:16 PM  Result Value Ref Range Status   Specimen Description  BLOOD RIGHT HAND  Final   Special Requests IN PEDIATRIC BOTTLE 2CC  Final   Culture   Final    NO GROWTH 5 DAYS Performed at [facility]    Report Status 05/23/2015 FINAL  Final  Culture, blood (x 2)     Status: None   Collection Time: 05/18/15  4:22 PM  Result Value Ref Range Status   Specimen Description BLOOD LEFT HAND  Final   Special Requests IN PEDIATRIC BOTTLE 1CC  Final   Culture   Final    NO GROWTH 5 DAYS Performed at [facility]    Report Status 05/23/2015 FINAL  Final  MRSA PCR Screening     Status: Abnormal   Collection Time: 05/18/15  6:00 PM  Result Value Ref Range Status   MRSA by PCR POSITIVE (A) NEGATIVE Final    Comment:        The GeneXpert MRSA Assay (FDA approved for NASAL specimens only), is one component of a comprehensive MRSA colonization surveillance program. It is not intended to diagnose MRSA infection nor to guide or monitor treatment for MRSA infections. RESULT CALLED TO, READ BACK BY AND VERIFIED WITH: M.HAIRSTON,RN AT 1958 ON 05/18/15 BY W.[facility]       Radiology Studies: No results found.   Scheduled Meds: . amantadine  100 mg Oral BID  . antiseptic oral rinse  7 mL Mouth Rinse BID  . enoxaparin (LOVENOX) injection  40 mg Subcutaneous Q24H  . folic acid  1 mg Oral Daily  . furosemide  60 mg Intravenous BID  . Gerhardt's butt cream   Topical BID  . lactulose  30 g Oral QID  . mometasone-formoterol  2 puff Inhalation BID  . pantoprazole  40 mg Oral Daily  . rifaximin  550 mg Oral BID  . risperiDONE  1 mg Oral QHS  . spironolactone  100 mg Oral BID  . thiamine  100 mg Oral Daily  . traZODone  25 mg Oral QHS    Somalia Segler, MD PhD [facility] Pager [phone]  If 7PM-7AM, please contact night-coverage [email or web address] Password [facility] 05/28/2015, 8:34 AM

## 2015-05-29 ENCOUNTER — Inpatient Hospital Stay (HOSPITAL_COMMUNITY): Payer: Managed Care, Other (non HMO)

## 2015-05-29 LAB — COMPREHENSIVE METABOLIC PANEL
ALBUMIN: 1.8 g/dL — AB (ref 3.5–5.0)
ALT: 56 U/L — AB (ref 14–54)
ANION GAP: 10 (ref 5–15)
AST: 113 U/L — ABNORMAL HIGH (ref 15–41)
Alkaline Phosphatase: 232 U/L — ABNORMAL HIGH (ref 38–126)
BUN: 7 mg/dL (ref 6–20)
CALCIUM: 8.2 mg/dL — AB (ref 8.9–10.3)
CO2: 23 mmol/L (ref 22–32)
Chloride: 100 mmol/L — ABNORMAL LOW (ref 101–111)
Creatinine, Ser: <0.3 mg/dL — ABNORMAL LOW (ref 0.44–1.00)
GLUCOSE: 90 mg/dL (ref 65–99)
Potassium: 3.3 mmol/L — ABNORMAL LOW (ref 3.5–5.1)
SODIUM: 133 mmol/L — AB (ref 135–145)
Total Bilirubin: 13 mg/dL — ABNORMAL HIGH (ref 0.3–1.2)
Total Protein: 5.5 g/dL — ABNORMAL LOW (ref 6.5–8.1)

## 2015-05-29 LAB — CBC
HCT: 35.8 % — ABNORMAL LOW (ref 36.0–46.0)
Hemoglobin: 12.2 g/dL (ref 12.0–15.0)
MCH: 35.9 pg — AB (ref 26.0–34.0)
MCHC: 34.1 g/dL (ref 30.0–36.0)
MCV: 105.3 fL — AB (ref 78.0–100.0)
PLATELETS: 130 10*3/uL — AB (ref 150–400)
RBC: 3.4 MIL/uL — AB (ref 3.87–5.11)
RDW: 21.4 % — ABNORMAL HIGH (ref 11.5–15.5)
WBC: 17.2 10*3/uL — ABNORMAL HIGH (ref 4.0–10.5)

## 2015-05-29 LAB — MAGNESIUM: Magnesium: 1.8 mg/dL (ref 1.7–2.4)

## 2015-05-29 LAB — VITAMIN B12: Vitamin B-12: 2387 pg/mL — ABNORMAL HIGH (ref 180–914)

## 2015-05-29 MED ORDER — IBUPROFEN 200 MG PO TABS
400.0000 mg | ORAL_TABLET | Freq: Two times a day (BID) | ORAL | Status: DC | PRN
Start: 2015-05-29 — End: 2015-06-05
  Administered 2015-05-29 – 2015-06-02 (×4): 400 mg via ORAL
  Filled 2015-05-29 (×5): qty 2

## 2015-05-29 MED ORDER — POTASSIUM CHLORIDE CRYS ER 20 MEQ PO TBCR
40.0000 meq | EXTENDED_RELEASE_TABLET | Freq: Once | ORAL | Status: AC
  Administered 2015-05-29: 40 meq via ORAL
  Filled 2015-05-29: qty 2

## 2015-05-29 NOTE — Progress Notes (Addendum)
LCSW following for disposition:  Home with Home Health patient has been denying SNF. [phone]:  RN reports patient may be agreeable to SNF.  LCSW has called [facility] who has not responded on the HUB to review patient.  [facility] is not in network with insurance, however patient may have an out of network benefit allowing [facility] to accept her. [facility] is reviewing patient's information again.   2:41pm UPDATE:  LCSW has met with patient at length to discuss disposition SNF.  Patient reports she does not want to go to a rest home. LCSW educated patient about SNF, benefits, short term time frame, and eligibility.  Patient politely declined. She reports she does not want to use what little money she has to pay for what her insurance will not cover. Patient reports she wants to go back home and is open to the idea of home health. LCSW made CM aware.  Patient and LCSW discussed family dynamics, motivation to get better and take care of herself, plans for when she returns home, and overall emotions around hospitalization.  Patient is not confused and declined SNF. LCSW plans to return tomorrow morning in case patient changes her mind. [facility] has been completed. There are no current bed offers.  Patient [place] willing [place] out of the county.  10:30am LCSW met with family and they report limited support at home to help her and need for SNF has her home is not in shape for her to live in. Mother reports she is having cleaning crew come to house and clean this weekend.  LCSW to meet with patient in room but she was having procedure. Will meet and discuss DC plan.  Lane Hacker, MSW Clinical Social Work: [employer] [phone]

## 2015-05-29 NOTE — Progress Notes (Signed)
Physical Therapy Treatment Patient Details Name: Anne Black MRN: [id number] DOB: 1960/10/29 Today's Date: 05/29/2015    History of Present Illness 55 y.o. female with a history of multiple sclerosis, bipolar disorder, depression and anxiety, and ETOH dependence presented with diarrhea, nausea and vomiting and admitted for Hepatic cirrhosis/elevated LFTs/decompensated with ascites as well as hepatic encephalopathy    PT Comments    Assisted OOB to [facility] only is all pt would agreed to do.  Follow Up Recommendations  SNF     Equipment Recommendations       Recommendations for Other Services       Precautions / Restrictions Precautions Precautions: Fall Precaution Comments: Hx falls Restrictions Weight Bearing Restrictions: No    Mobility  Bed Mobility Overal bed mobility: Needs Assistance Bed Mobility: Supine to Sit;Sit to Supine     Supine to sit: Min assist Sit to supine: Min assist   General bed mobility comments: more assist to get B LE's up onto bed  Transfers Overall transfer level: Needs assistance Equipment used: None             General transfer comment: pt only agreed to transfer to and from [facility]  Ambulation/Gait    Stairs            Wheelchair Mobility    Modified Rankin (Stroke Patients Only)       Balance                                    Cognition Arousal/Alertness: Awake/alert Behavior During Therapy: WFL for tasks assessed/performed Overall Cognitive Status: Within Functional Limits for tasks assessed                      Exercises      General Comments        Pertinent Vitals/Pain Pain Assessment: Faces Faces Pain Scale: Hurts little more Pain Location: Buttocks Pain Descriptors / Indicators: Burning;Tender Pain Intervention(s): Monitored during session    Home Living                      Prior Function            PT Goals (current goals can now be found in the care plan  section) Progress towards PT goals: Progressing toward goals    Frequency  Min 3X/week    PT Plan      Co-evaluation             End of Session Equipment Utilized During Treatment: Gait belt   Patient left: in bed;with call bell/phone within reach;with bed alarm set     Time: 6962-9528 PT Time Calculation (min) (ACUTE ONLY): 10 min  Charges:  $Therapeutic Activity: 8-22 mins                    G Codes:      Felecia Shelling  PTA [facility]  Acute  Rehab Pager      [phone]

## 2015-05-29 NOTE — Clinical Social Work Placement (Addendum)
   CLINICAL SOCIAL WORK PLACEMENT  NOTE  Date:  05/29/2015  Patient Details  Name: RAMSEY LEVERINGTON MRN: [id number] Date of Birth: 27-Feb-1960  Clinical Social Work is seeking post-discharge placement for this patient at the Skilled  Nursing Facility level of care (*CSW will initial, date and re-position this form in  chart as items are completed):  Yes   Patient/family provided with [facility] Clinical Social Work Department's list of facilities offering this level of care within the geographic area requested by the patient (or if unable, by the patient's family).  Yes   Patient/family informed of their freedom to choose among providers that offer the needed level of care, that participate in Medicare, Medicaid or managed care program needed by the patient, have an available bed and are willing to accept the patient.  Yes   Patient/family informed of [facility]'s ownership interest in [facility] and [facility], as well as of the fact that they are under no obligation to receive care at these facilities.  PASRR submitted to EDS on 05/29/15     PASRR number received on       Existing PASRR number confirmed on       FL2 transmitted to all facilities in geographic area requested by pt/family on 05/28/15     FL2 transmitted to all facilities within larger geographic area on       Patient informed that his/her managed care company has contracts with or will negotiate with certain facilities, including the following:            Patient/family informed of bed offers received.  Patient chooses bed at       Physician recommends and patient chooses bed at      Patient to be transferred to   on  .  Patient to be transferred to facility by       Patient family notified on   of transfer.  Name of family member notified:        PHYSICIAN Please sign FL2     Additional Comment:  30 day passar and information has been fax. Waiting on  passar.  _______________________________________________ Raye Sorrow, LCSW 05/29/2015, 9:54 AM

## 2015-05-29 NOTE — Progress Notes (Signed)
Pt refusing to have echo done this am. MD aware.  Stefanie Hodgens W [place]unbo Mccadden, RN

## 2015-05-29 NOTE — Clinical Social Work Placement (Signed)
   CLINICAL SOCIAL WORK PLACEMENT  NOTE  Date:  05/29/2015  Patient Details  Name: Anne Black MRN: [id number] Date of Birth: 1960-06-14  Clinical Social Work is seeking post-discharge placement for this patient at the Skilled  Nursing Facility level of care (*CSW will initial, date and re-position this form in  chart as items are completed):  Yes   Patient/family provided with [facility] Clinical Social Work Department's list of facilities offering this level of care within the geographic area requested by the patient (or if unable, by the patient's family).  Yes   Patient/family informed of their freedom to choose among providers that offer the needed level of care, that participate in Medicare, Medicaid or managed care program needed by the patient, have an available bed and are willing to accept the patient.  Yes   Patient/family informed of [facility]'s ownership interest in [facility] and [facility], as well as of the fact that they are under no obligation to receive care at these facilities.  PASRR submitted to EDS on 05/29/15     PASRR number received on 05/29/15     Existing PASRR number confirmed on       FL2 transmitted to all facilities in geographic area requested by pt/family on 05/28/15     FL2 transmitted to all facilities within larger geographic area on       Patient informed that his/her managed care company has contracts with or will negotiate with certain facilities, including the following:            Patient/family informed of bed offers received.  Patient chooses bed at       Physician recommends and patient chooses bed at      Patient to be transferred to   on  .  Patient to be transferred to facility by       Patient family notified on   of transfer.  Name of family member notified:        PHYSICIAN Please sign FL2     Additional Comment:  [phone] [id number]  _______________________________________________ Raye Sorrow,  LCSW 05/29/2015, 1:29 PM

## 2015-05-29 NOTE — Progress Notes (Signed)
PROGRESS NOTE  Anne Black VWU:[id number] DOB: 12/08/1960 DOA: 05/18/2015 PCP: No primary care provider on file.   LOS: 11 days   Subjective:  aaox3 today, denies pain, she likes the regular diet,    Brief Narrative: 55 y.o. female with a history of multiple sclerosis, bipolar disorder, depression and anxiety, and ETOH dependence presented to the emergency department 3 episodes of diarrhea with nausea and vomiting in the last day. She also complained of some burning abdominal pain which is been constant, more so in the upper right quadrant. Patient has been to the emergency department twice this past week for similar presentation and refused admission on 1 occasion. Patient also states that she would like to undergo alcohol detox. In the emergency department, patient was found to have tachycardia with elevated lactic acid and leukocytosis. CT of the abdomen showed hepatic cirrhosis.  Assessment & Plan: Principal Problem:   Encephalopathy, hepatic (HCC) Active Problems:   MS (multiple sclerosis) (HCC)   Bipolar disorder, curr episode mixed, severe, w/o psychotic features (HCC)   Nicotine abuse   Alcohol withdrawal (HCC)   Abdominal pain   Anxiety   Sepsis (HCC)   UTI (lower urinary tract infection)   Hepatic cirrhosis (HCC)   Hypokalemia  Hyponatremia:   acute drop from yesterday, repeat bmp, na better, could be lab variation. Repeat bmp in am, stable  Persistent leukocytosis, no fever,  repeat ua no infection, foley removed,wbc slightly trending down, monitor  Hepatic encephalopathy - Hepatic cirrhosis decompensated with ascites, anasarca and hepatic encephalopathy - Ammonia: 108 on 4/10, we'll not check ammonia level again unless her mentation is worsening. - Continue current lactulose dose , keep bm 3-4 time a day, continue rifaximin, improving.  Hepatic cirrhosis -Likely secondary to alcohol use. Negative for hep C, negative ferritin and ceruloplasmin. -Patient with  ascites, anasarca, hypoalbuminemia, hyperbilirubinemia, transaminitis and from cytopenia. -On lactulose, rifaximin, Lasix and Aldactone. -need to follow up with GI on outpatient basis  Sepsis secondary to ?SBP vs UTI - Upon admission, she was tachycardic with leukocytosis, tachypnea and lactic acid of 5.4. - CT abdomen showed hepatic cirrhosis, moderate amount of ascites - EDP did perform bedside paracentesis, cell count was not sent, we will not repeat paracentesis only for cell count. No ab pain,  - The urine culture showed Escherichia coli and Klebsiella, received zosyn then Rocephin, last dose of abx on 05/26/2015.  Hypokalemia - 04/11 potassium was 2.8 - Had severe hypokalemia down to 2.1, she was not getting oral supplementation. -check mag  Alcohol abuse with withdrawal - Was on CIWA protocol, discontinued. - This is resolved.  Abdominal pain with nausea and vomiting diarrhea - Possibly due to the above versus withdrawal symptoms - Continue pain control, antiemetics  Prolonged QTc, QTc 689 on admission - Avoid QT prolonging meds, repeat ekg in am, d/c tele if QWTc return to normal range  Anxiety/depression/bipolar disorder - Continue Cogentin, Risperdal, Prozac  Migraine headaches - Continue Topamax  Multiple sclerosis/ADHD - Hold Provigil due to elevated LFTs  Need outpatient neurology follow up, patient was last seen at [facility] neurology Dr Allena Earing for MS.  Tobacco abuse - Continue nicotine patch  Macrocytosis - Likely in the setting of alcohol abuse - check B12 and folate.  - Monitor  Thrombocytopenia - Due to liver disease, monitor, discontinue Lovenox if it goes less than 100 K   DVT prophylaxis: Lovenox Code Status: Full Condition: Guarded Family Communication: . Communicated plan with patient and daughter in room on  4/19 Disposition Plan: snf when bed available  Antimicrobials: - Zosyn 4/9 >> 4/12 Rocephin from 4/12  -4/17   Objective: Filed Vitals:   05/28/15 1300 05/28/15 2115 05/29/15 0453 05/29/15 0456  BP: 107/65 113/62  124/67  Pulse: 97 87  84  Temp: 97.9 F (36.6 C) 97.5 F (36.4 C)  97.6 F (36.4 C)  TempSrc: Oral Oral  Oral  Resp: 20 20  20   Height:      Weight:   86.183 kg (190 lb)   SpO2: 95% 94%  96%    Intake/Output Summary (Last 24 hours) at 05/29/15 0958 Last data filed at 05/28/15 1440  Gross per 24 hour  Intake    600 ml  Output    550 ml  Net     50 ml   Filed Weights   05/27/15 0528 05/28/15 0601 05/29/15 0453  Weight: 92 kg (202 lb 13.2 oz) 91.6 kg (201 lb 15.1 oz) 86.183 kg (190 lb)    Examination: Constitutional: anxious  Filed Vitals:   05/28/15 1300 05/28/15 2115 05/29/15 0453 05/29/15 0456  BP: 107/65 113/62  124/67  Pulse: 97 87  84  Temp: 97.9 F (36.6 C) 97.5 F (36.4 C)  97.6 F (36.4 C)  TempSrc: Oral Oral  Oral  Resp: 20 20  20   Height:      Weight:   86.183 kg (190 lb)   SpO2: 95% 94%  96%   Eyes: PERRL, + sclerae icterus  ENMT: Mucous membranes are moist.  Respiratory: clear to auscultation bilaterally, no wheezing, no crackles. Abdominal breathing pattern  Cardiovascular: Regular rate and rhythm, no murmurs / rubs / gallops. 2+ Bilateral extremity pitting edema has improved. pedal pulses present.  Abdomen: no tenderness, no masses palpated. Bowel sounds positive. Distended  Neurologic: aaox3, but does not has insight in her disease process. Nonfocal  Data Reviewed: I have personally reviewed following labs and imaging studies  CBC:  Recent Labs Lab 05/24/15 0308 05/27/15 0515 05/28/15 0517 05/29/15 0538  WBC 14.5* 17.9* 18.1* 17.2*  HGB 12.4 12.8 12.7 12.2  HCT 36.0 37.6 37.0 35.8*  MCV 105.9* 107.1* 106.9* 105.3*  PLT 110* 121* 125* 130*   Basic Metabolic Panel:  Recent Labs Lab 05/25/15 0545  05/26/15 0651 05/27/15 0515 05/28/15 0517 05/28/15 0935 05/29/15 0538  NA 137  < > 136 135 129* 133* 133*  K 2.8*   < > 3.8 2.9* 4.8 3.7 3.3*  CL 100*  < > 103 101 97* 99* 100*  CO2 27  < > 23 25 22 23 23   GLUCOSE 121*  < > 87 77 74 94 90  BUN 8  < > 7 7 7 7 7   CREATININE 0.36*  < > <0.30* 0.32* <0.30* 0.36* <0.30*  CALCIUM 8.1*  < > 8.1* 7.8* 7.9* 8.3* 8.2*  MG 2.4  --   --   --  1.9  --  1.8  < > = values in this interval not displayed. GFR: CrCl cannot be calculated (Patient has no serum creatinine result on file.). Liver Function Tests:  Recent Labs Lab 05/25/15 0545 05/26/15 0651 05/27/15 0515 05/28/15 0517 05/29/15 0538  AST 154* 146* 122* 158* 113*  ALT 72* 65* 58* 56* 56*  ALKPHOS 287* 264* 237* 260* 232*  BILITOT 12.0* 12.5* 12.1* 12.1* 13.0*  PROT 5.9* 5.6* 5.5* 5.1* 5.5*  ALBUMIN 2.1* 2.0* 1.8* 1.8* 1.8*   No results for input(s): LIPASE, AMYLASE in the last 168 hours.  Recent  Labs Lab 05/23/15 2000  AMMONIA 96*   Coagulation Profile: No results for input(s): INR, PROTIME in the last 168 hours. Urine analysis:    Component Value Date/Time   COLORURINE ORANGE* 05/28/2015 1419   APPEARANCEUR CLOUDY* 05/28/2015 1419   LABSPEC 1.013 05/28/2015 1419   PHURINE 6.0 05/28/2015 1419   GLUCOSEU NEGATIVE 05/28/2015 1419   HGBUR SMALL* 05/28/2015 1419   BILIRUBINUR LARGE* 05/28/2015 1419   KETONESUR NEGATIVE 05/28/2015 1419   PROTEINUR NEGATIVE 05/28/2015 1419   UROBILINOGEN 0.2 09/14/2011 2141   NITRITE NEGATIVE 05/28/2015 1419   LEUKOCYTESUR TRACE* 05/28/2015 1419   Sepsis Labs: Invalid input(s): PROCALCITONIN, LACTICIDVEN  No results found for this or any previous visit (from the past 240 hour(s)).    Radiology Studies: Dg Chest 1 View  05/28/2015  CLINICAL DATA:  Ongoing cough which has worsened, some shortness of breath, multiple sclerosis, smoker EXAM: CHEST 1 VIEW COMPARISON:  Portable exam 1405 hours compared to 05/18/2015 FINDINGS: Enlargement of cardiac silhouette. Mediastinal contours and pulmonary vascularity normal. Lungs clear. No pleural effusion or  pneumothorax. Bones demineralized. IMPRESSION: Minimal enlargement of cardiac silhouette without acute infiltrate. Electronically Signed   By: Ulyses Southward M.D.   On: 05/28/2015 14:37     Scheduled Meds: . amantadine  100 mg Oral BID  . antiseptic oral rinse  7 mL Mouth Rinse BID  . enoxaparin (LOVENOX) injection  40 mg Subcutaneous Q24H  . feeding supplement (PRO-STAT SUGAR FREE 64)  30 mL Oral BID  . folic acid  1 mg Oral Daily  . furosemide  40 mg Oral Daily  . Gerhardt's butt cream   Topical BID  . lactulose  30 g Oral TID  . mometasone-formoterol  2 puff Inhalation BID  . pantoprazole  40 mg Oral Daily  . potassium chloride  40 mEq Oral Once  . rifaximin  550 mg Oral BID  . risperiDONE  1 mg Oral QHS  . spironolactone  100 mg Oral Daily  . thiamine  100 mg Oral Daily  . traZODone  25 mg Oral QHS    Jhamir Pickup, MD PhD [facility] Pager [phone]  If 7PM-7AM, please contact night-coverage [email or web address] Password [facility] 05/29/2015, 9:58 AM

## 2015-05-30 ENCOUNTER — Other Ambulatory Visit (HOSPITAL_COMMUNITY): Payer: Self-pay

## 2015-05-30 LAB — FOLATE RBC
FOLATE, RBC: 1008 ng/mL (ref >498)
Folate, Hemolysate: 371 ng/mL
Hematocrit: 36.8 % (ref 34.0–46.6)

## 2015-05-30 LAB — HEPATIC FUNCTION PANEL
ALBUMIN: 1.9 g/dL — AB (ref 3.5–5.0)
ALT: 50 U/L (ref 14–54)
AST: 119 U/L — ABNORMAL HIGH (ref 15–41)
Alkaline Phosphatase: 234 U/L — ABNORMAL HIGH (ref 38–126)
Bilirubin, Direct: 7.6 mg/dL — ABNORMAL HIGH (ref 0.1–0.5)
Indirect Bilirubin: 5.4 mg/dL — ABNORMAL HIGH (ref 0.3–0.9)
TOTAL PROTEIN: 5.5 g/dL — AB (ref 6.5–8.1)
Total Bilirubin: 13 mg/dL — ABNORMAL HIGH (ref 0.3–1.2)

## 2015-05-30 LAB — BASIC METABOLIC PANEL
ANION GAP: 10 (ref 5–15)
BUN: 6 mg/dL (ref 6–20)
CO2: 23 mmol/L (ref 22–32)
Calcium: 8.2 mg/dL — ABNORMAL LOW (ref 8.9–10.3)
Chloride: 100 mmol/L — ABNORMAL LOW (ref 101–111)
Creatinine, Ser: 0.3 mg/dL — ABNORMAL LOW (ref 0.44–1.00)
GFR calc Af Amer: >60 mL/min (ref >60)
GLUCOSE: 92 mg/dL (ref 65–99)
POTASSIUM: 3.4 mmol/L — AB (ref 3.5–5.1)
SODIUM: 133 mmol/L — AB (ref 135–145)

## 2015-05-30 LAB — CBC
HEMATOCRIT: 37.7 % (ref 36.0–46.0)
HEMOGLOBIN: 13 g/dL (ref 12.0–15.0)
MCH: 36.8 pg — ABNORMAL HIGH (ref 26.0–34.0)
MCHC: 34.5 g/dL (ref 30.0–36.0)
MCV: 106.8 fL — AB (ref 78.0–100.0)
Platelets: 131 10*3/uL — ABNORMAL LOW (ref 150–400)
RBC: 3.53 MIL/uL — AB (ref 3.87–5.11)
RDW: 21.7 % — ABNORMAL HIGH (ref 11.5–15.5)
WBC: 17.5 10*3/uL — AB (ref 4.0–10.5)

## 2015-05-30 LAB — PROTIME-INR
INR: 1.42 (ref 0.00–1.49)
PROTHROMBIN TIME: 16.9 s — AB (ref 11.6–15.2)

## 2015-05-30 MED ORDER — NYSTATIN 100000 UNIT/GM EX POWD
Freq: Two times a day (BID) | CUTANEOUS | Status: DC
  Administered 2015-05-30 – 2015-06-01 (×5): via TOPICAL
  Filled 2015-05-30: qty 15

## 2015-05-30 NOTE — Progress Notes (Signed)
LCSW met with patient again and spoke to facility regarding insurance benefit and bed offer. Patient has almost met her deductible and has an out of network benefit allowing her a SNF benefit at [facility]. [facility] can accept patient if patient is agreeable. Patient continues to be agreeable with nursing, but when LCSW comes in to speak on the subject she becomes defensive and refusing. She reports she wants to remain in the hospital and just continue what she is doing.  LCSW explained benefits, short term time period at facility, and return home once more stable and less weak. LCSW explained patient needing the be reasonable and what is realistic at this time for her. RN asked to speak with patient again to follow up with plan as she seems to be more willing and open with RN at this time.  Disposition:  Bed available at SNF if patient is agreeable.   Lane Hacker, MSW Clinical Social Work: [employer] [phone]

## 2015-05-30 NOTE — Progress Notes (Signed)
PROGRESS NOTE  Anne Black JJO:[id number] DOB: [birth year] DOA: 05/18/2015 PCP: No primary care provider on file.   LOS: 12 days   Subjective:  aaox3 today, denies pain, she likes the regular diet, sitting on edge of bed   Brief Narrative: 55 y.o. female with a history of multiple sclerosis, bipolar disorder, depression and anxiety, and ETOH dependence presented to the emergency department 3 episodes of diarrhea with nausea and vomiting in the last day. She also complained of some burning abdominal pain which is been constant, more so in the upper right quadrant. Patient has been to the emergency department twice this past week for similar presentation and refused admission on 1 occasion. Patient also states that she would like to undergo alcohol detox. In the emergency department, patient was found to have tachycardia with elevated lactic acid and leukocytosis. CT of the abdomen showed hepatic cirrhosis.  Assessment & Plan: Principal Problem:   Encephalopathy, hepatic (HCC) Active Problems:   MS (multiple sclerosis) (HCC)   Bipolar disorder, curr episode mixed, severe, w/o psychotic features (HCC)   Nicotine abuse   Alcohol withdrawal (HCC)   Abdominal pain   Anxiety   Sepsis (HCC)   UTI (lower urinary tract infection)   Hepatic cirrhosis (HCC)   Hypokalemia  Hyponatremia:   acute drop from yesterday, repeat bmp, na better, could be lab variation. Repeat bmp in am, stable  Persistent leukocytosis, no fever,  repeat ua no infection, foley removed,wbc slightly trending down, monitor  Hepatic encephalopathy - Hepatic cirrhosis decompensated with ascites, anasarca and hepatic encephalopathy - Ammonia: 108 on 4/10, we'll not check ammonia level again unless her mentation is worsening. - Continue current lactulose dose , keep bm 3-4 time a day, continue rifaximin, improving.  Hepatic cirrhosis -Likely secondary to alcohol use. Negative for hep C, negative ferritin and  ceruloplasmin. -Patient with ascites, anasarca, hypoalbuminemia, hyperbilirubinemia, transaminitis and from cytopenia. -On lactulose, rifaximin, Lasix and Aldactone. -need to follow up with GI on outpatient basis  Sepsis secondary to ?SBP vs UTI - Upon admission, she was tachycardic with leukocytosis, tachypnea and lactic acid of 5.4. - CT abdomen showed hepatic cirrhosis, moderate amount of ascites - EDP did perform bedside paracentesis, cell count was not sent, we will not repeat paracentesis only for cell count. No ab pain,  - The urine culture showed Escherichia coli and Klebsiella, received zosyn then Rocephin, last dose of abx on 05/26/2015.  Hypokalemia - 04/11 potassium was 2.8 - Had severe hypokalemia down to 2.1, she was not getting oral supplementation. -check mag  Alcohol abuse with withdrawal - Was on CIWA protocol, discontinued. - This is resolved.  Abdominal pain with nausea and vomiting diarrhea - Possibly due to the above versus withdrawal symptoms - Continue pain control, antiemetics  Prolonged QTc, QTc 689 on admission - Avoid QT prolonging meds, repeat ekg QTC 361, d/c tele.   Anxiety/depression/bipolar disorder - Continue Cogentin, Risperdal, Prozac Psych consulted for capacity eval, patient has intermittently refusing meds/testing/refusing snf placement  Migraine headaches - Continue Topamax  Multiple sclerosis/ADHD - Hold Provigil due to elevated LFTs  Need outpatient neurology follow up, patient was last seen at [facility] neurology Dr Allena Earing for MS.  Tobacco abuse - Continue nicotine patch  Macrocytosis - Likely in the setting of alcohol abuse - check B12 and folate.  - Monitor  Thrombocytopenia - Due to liver disease, monitor, discontinue Lovenox if it goes less than 100 K   DVT prophylaxis: Lovenox Code Status: Full Condition: Guarded Family  Communication:  Communicated plan with patient and daughter in room on  4/19 Disposition Plan: snf when bed available, pending capacity eval  Antimicrobials: - Zosyn 4/9 >> 4/12 Rocephin from 4/12 -4/17   Objective: Filed Vitals:   05/30/15 0500 05/30/15 0900 05/30/15 0939 05/30/15 1419  BP: 110/63   132/81  Pulse:    93  Temp: 97.5 F (36.4 C)   97.4 F (36.3 C)  TempSrc: Oral   Oral  Resp: 20   20  Height:      Weight:  93.441 kg (206 lb)    SpO2: 96%  96% 96%    Intake/Output Summary (Last 24 hours) at 05/30/15 1524 Last data filed at 05/30/15 0900  Gross per 24 hour  Intake    420 ml  Output    175 ml  Net    245 ml   Filed Weights   05/28/15 0601 05/29/15 0453 05/30/15 0900  Weight: 91.6 kg (201 lb 15.1 oz) 86.183 kg (190 lb) 93.441 kg (206 lb)    Examination: Constitutional: anxious  Filed Vitals:   05/30/15 0500 05/30/15 0900 05/30/15 0939 05/30/15 1419  BP: 110/63   132/81  Pulse:    93  Temp: 97.5 F (36.4 C)   97.4 F (36.3 C)  TempSrc: Oral   Oral  Resp: 20   20  Height:      Weight:  93.441 kg (206 lb)    SpO2: 96%  96% 96%   Eyes: PERRL, + sclerae icterus  ENMT: Mucous membranes are moist.  Respiratory: clear to auscultation bilaterally, no wheezing, no crackles. Abdominal breathing pattern  Cardiovascular: Regular rate and rhythm, no murmurs / rubs / gallops. 2+ Bilateral extremity pitting edema has improved. pedal pulses present.  Abdomen: no tenderness, no masses palpated. Bowel sounds positive. Distended  Neurologic: aaox3, but does not has insight in her disease process. Nonfocal  Data Reviewed: I have personally reviewed following labs and imaging studies  CBC:  Recent Labs Lab 05/24/15 0308 05/27/15 0515 05/28/15 0517 05/29/15 0538 05/30/15 1248  WBC 14.5* 17.9* 18.1* 17.2* 17.5*  HGB 12.4 12.8 12.7 12.2 13.0  HCT 36.0 37.6 37.0 35.8* 37.7  MCV 105.9* 107.1* 106.9* 105.3* 106.8*  PLT 110* 121* 125* 130* 131*   Basic Metabolic Panel:  Recent Labs Lab 05/25/15 0545  05/27/15 0515  05/28/15 0517 05/28/15 0935 05/29/15 0538 05/30/15 0521  NA 137  < > 135 129* 133* 133* 133*  K 2.8*  < > 2.9* 4.8 3.7 3.3* 3.4*  CL 100*  < > 101 97* 99* 100* 100*  CO2 27  < > GLUCOSE 121*  < > 77 74 94 90 92  BUN 8  < > CREATININE 0.36*  < > 0.32* <0.30* 0.36* <0.30* 0.30*  CALCIUM 8.1*  < > 7.8* 7.9* 8.3* 8.2* 8.2*  MG 2.4  --   --  1.9  --  1.8  --   < > = values in this interval not displayed. GFR: Estimated Creatinine Clearance: 84.5 mL/min (by C-G formula based on Cr of 0.3). Liver Function Tests:  Recent Labs Lab 05/26/15 0651 05/27/15 0515 05/28/15 0517 05/29/15 0538 05/30/15 0521  AST 146* 122* 158* 113* 119*  ALT 65* 58* 56* 56* 50  ALKPHOS 264* 237* 260* 232* 234*  BILITOT 12.5* 12.1* 12.1* 13.0* 13.0*  PROT 5.6* 5.5* 5.1* 5.5* 5.5*  ALBUMIN 2.0* 1.8* 1.8* 1.8* 1.9*  No results for input(s): LIPASE, AMYLASE in the last 168 hours.  Recent Labs Lab 05/23/15 2000  AMMONIA 96*   Coagulation Profile:  Recent Labs Lab 05/30/15 0521  INR 1.42   Urine analysis:    Component Value Date/Time   COLORURINE ORANGE* 05/28/2015 1419   APPEARANCEUR CLOUDY* 05/28/2015 1419   LABSPEC 1.013 05/28/2015 1419   PHURINE 6.0 05/28/2015 1419   GLUCOSEU NEGATIVE 05/28/2015 1419   HGBUR SMALL* 05/28/2015 1419   BILIRUBINUR LARGE* 05/28/2015 1419   KETONESUR NEGATIVE 05/28/2015 1419   PROTEINUR NEGATIVE 05/28/2015 1419   UROBILINOGEN 0.2 09/14/2011 2141   NITRITE NEGATIVE 05/28/2015 1419   LEUKOCYTESUR TRACE* 05/28/2015 1419   Sepsis Labs: Invalid input(s): PROCALCITONIN, LACTICIDVEN  No results found for this or any previous visit (from the past 240 hour(s)).    Radiology Studies: No results found.   Scheduled Meds: . amantadine  100 mg Oral BID  . antiseptic oral rinse  7 mL Mouth Rinse BID  . enoxaparin (LOVENOX) injection  40 mg Subcutaneous Q24H  . feeding supplement (PRO-STAT SUGAR FREE 64)  30 mL Oral BID  . folic acid   1 mg Oral Daily  . furosemide  40 mg Oral Daily  . Gerhardt's butt cream   Topical BID  . lactulose  30 g Oral TID  . mometasone-formoterol  2 puff Inhalation BID  . nystatin   Topical BID  . pantoprazole  40 mg Oral Daily  . rifaximin  550 mg Oral BID  . risperiDONE  1 mg Oral QHS  . spironolactone  100 mg Oral Daily  . thiamine  100 mg Oral Daily  . traZODone  25 mg Oral QHS    Sherlin Sonier, MD PhD [facility] Pager [phone]  If 7PM-7AM, please contact night-coverage [email or web address] Password [email or web address] 05/30/2015, 3:24 PM

## 2015-05-30 NOTE — NC FL2 (Signed)
[place] MEDICAID FL2 LEVEL OF CARE SCREENING TOOL     IDENTIFICATION  Patient Name: Anne Black Birthdate: Sep 30, 1960 Sex: female Admission Date (Current Location): 05/18/2015  [facility] and [place] Number:  Producer, television/film/video and Address:  [facility],  [address], [place] [address]      Provider Number: [id number]  Attending Physician Name and Address:  Albertine Grates, MD  Relative Name and Phone Number:       Current Level of Care: Hospital Recommended Level of Care: Skilled Nursing Facility Prior Approval Number:    Date Approved/Denied:   PASRR Number:   [id number]  Discharge Plan: SNF    Current Diagnoses: Patient Active Problem List   Diagnosis Date Noted  . Hypokalemia 05/26/2015  . Encephalopathy, hepatic (HCC) 05/26/2015  . Alcohol withdrawal (HCC) 05/18/2015  . Abdominal pain 05/18/2015  . Anxiety 05/18/2015  . Sepsis (HCC) 05/18/2015  . UTI (lower urinary tract infection) 05/18/2015  . Hepatic cirrhosis (HCC) 05/18/2015  . Stimulant abuse 12/27/2014  . Nicotine abuse 12/27/2014  . Hyperprolactinemia (HCC) 11/15/2014  . Bipolar disorder, curr episode mixed, severe, w/o psychotic features (HCC) 11/13/2014  . Dyslipidemia   . Alcohol use disorder, moderate, dependence (HCC) 10/30/2014  . Noncompliance with therapeutic plan 04/04/2013  . Unspecified hereditary and idiopathic peripheral neuropathy 05/01/2012  . GERD (gastroesophageal reflux disease) 05/01/2012  . Osteoarthrosis, unspecified whether generalized or localized, involving lower leg 05/01/2012  . MS (multiple sclerosis) (HCC) 09/14/2011    Orientation RESPIRATION BLADDER Height & Weight     Self  Normal Continent Weight: 190 lb (86.183 kg) Height:   (157.5 cm)  BEHAVIORAL SYMPTOMS/MOOD NEUROLOGICAL BOWEL NUTRITION STATUS   (NONE)  (NONE) Incontinent Diet (Heart Healthy)  AMBULATORY STATUS COMMUNICATION OF NEEDS Skin   Extensive Assist Verbally Normal                      Personal Care Assistance Level of Assistance  Bathing, Feeding, Dressing Bathing Assistance: Limited assistance Feeding assistance: Independent Dressing Assistance: Limited assistance     Functional Limitations Info  Sight, Hearing, Speech Sight Info: Adequate Hearing Info: Adequate Speech Info: Adequate    SPECIAL CARE FACTORS FREQUENCY  PT (By licensed PT), OT (By licensed OT)     PT Frequency: 5/week OT Frequency: 5/week            Contractures Contractures Info: Not present    Additional Factors Info  Code Status, Allergies, Isolation Precautions Code Status Info: Full Allergies Info: NKDA     Isolation Precautions Info: Contact isolation for MRSA by PCR.     Current Medications (05/30/2015):  This is the current hospital active medication list Current Facility-Administered Medications  Medication Dose Route Frequency Provider Last Rate Last Dose  . albuterol (PROVENTIL) (2.5 MG/3ML) 0.083% nebulizer solution 3 mL  3 mL Inhalation Q6H PRN Maryann Mikhail, DO      . amantadine (SYMMETREL) capsule 100 mg  100 mg Oral BID Maryann Mikhail, DO   100 mg at 05/30/15 0856  . antiseptic oral rinse (CPC / CETYLPYRIDINIUM CHLORIDE 0.05%) solution 7 mL  7 mL Mouth Rinse BID Leatha Gilding, MD   7 mL at 05/30/15 0856  . benzonatate (TESSALON) capsule 100 mg  100 mg Oral TID PRN Albertine Grates, MD   100 mg at 05/29/15 1110  . diphenhydrAMINE (BENADRYL) capsule 25 mg  25 mg Oral Q8H PRN Leda Gauze, NP   25 mg at 05/28/15 2300  .  enoxaparin (LOVENOX) injection 40 mg  40 mg Subcutaneous Q24H Maryann Mikhail, DO   40 mg at 05/29/15 1722  . feeding supplement (PRO-STAT SUGAR FREE 64) liquid 30 mL  30 mL Oral BID Albertine Grates, MD   30 mL at 05/28/15 1154  . folic acid (FOLVITE) tablet 1 mg  1 mg Oral Daily Maryann Mikhail, DO   1 mg at 05/30/15 0856  . furosemide (LASIX) tablet 40 mg  40 mg Oral Daily Albertine Grates, MD   40 mg at 05/30/15 0856  . Gerhardt's butt cream   Topical  BID Clydia Llano, MD      . ibuprofen (ADVIL,MOTRIN) tablet 400 mg  400 mg Oral BID PRN Albertine Grates, MD   400 mg at 05/29/15 1727  . lactulose (CHRONULAC) 10 GM/15ML solution 30 g  30 g Oral TID Albertine Grates, MD   30 g at 05/29/15 2126  . mometasone-formoterol (DULERA) 200-5 MCG/ACT inhaler 2 puff  2 puff Inhalation BID Maryann Mikhail, DO   2 puff at 05/29/15 2217  . ondansetron (ZOFRAN) tablet 4 mg  4 mg Oral Q6H PRN Maryann Mikhail, DO       Or  . ondansetron (ZOFRAN) injection 4 mg  4 mg Intravenous Q6H PRN Maryann Mikhail, DO   4 mg at 05/27/15 0544  . pantoprazole (PROTONIX) EC tablet 40 mg  40 mg Oral Daily Clydia Llano, MD   40 mg at 05/30/15 0855  . rifaximin (XIFAXAN) tablet 550 mg  550 mg Oral BID Clydia Llano, MD   550 mg at 05/30/15 0855  . risperiDONE (RISPERDAL) tablet 1 mg  1 mg Oral QHS Clydia Llano, MD   1 mg at 05/29/15 2127  . spironolactone (ALDACTONE) tablet 100 mg  100 mg Oral Daily Albertine Grates, MD   100 mg at 05/30/15 0856  . thiamine (VITAMIN B-1) tablet 100 mg  100 mg Oral Daily Maryann Mikhail, DO   100 mg at 05/30/15 0856  . traMADol (ULTRAM) tablet 50 mg  50 mg Oral Q6H PRN Leda Gauze, NP   50 mg at 05/29/15 1110  . traZODone (DESYREL) tablet 25 mg  25 mg Oral QHS Clydia Llano, MD   25 mg at 05/29/15 2127     Discharge Medications: Please see discharge summary for a list of discharge medications.  Relevant Imaging Results:  Relevant Lab Results:   Additional Information SSN: [id number]  Raye Sorrow, LCSW

## 2015-05-31 DIAGNOSIS — E43 Unspecified severe protein-calorie malnutrition: Secondary | ICD-10-CM

## 2015-05-31 DIAGNOSIS — K7011 Alcoholic hepatitis with ascites: Secondary | ICD-10-CM

## 2015-05-31 DIAGNOSIS — F3160 Bipolar disorder, current episode mixed, unspecified: Secondary | ICD-10-CM

## 2015-05-31 DIAGNOSIS — D539 Nutritional anemia, unspecified: Secondary | ICD-10-CM

## 2015-05-31 DIAGNOSIS — K7030 Alcoholic cirrhosis of liver without ascites: Secondary | ICD-10-CM

## 2015-05-31 DIAGNOSIS — F319 Bipolar disorder, unspecified: Secondary | ICD-10-CM

## 2015-05-31 LAB — COMPREHENSIVE METABOLIC PANEL
ALBUMIN: 2 g/dL — AB (ref 3.5–5.0)
ALT: 57 U/L — AB (ref 14–54)
ANION GAP: 11 (ref 5–15)
AST: 125 U/L — ABNORMAL HIGH (ref 15–41)
Alkaline Phosphatase: 240 U/L — ABNORMAL HIGH (ref 38–126)
BUN: 6 mg/dL (ref 6–20)
CHLORIDE: 99 mmol/L — AB (ref 101–111)
CO2: 23 mmol/L (ref 22–32)
Calcium: 8.5 mg/dL — ABNORMAL LOW (ref 8.9–10.3)
Creatinine, Ser: <0.3 mg/dL — ABNORMAL LOW (ref 0.44–1.00)
GLUCOSE: 94 mg/dL (ref 65–99)
Potassium: 3.2 mmol/L — ABNORMAL LOW (ref 3.5–5.1)
SODIUM: 133 mmol/L — AB (ref 135–145)
Total Bilirubin: 14.5 mg/dL — ABNORMAL HIGH (ref 0.3–1.2)
Total Protein: 6 g/dL — ABNORMAL LOW (ref 6.5–8.1)

## 2015-05-31 LAB — AMMONIA: AMMONIA: 49 umol/L — AB (ref 9–35)

## 2015-05-31 LAB — MAGNESIUM: Magnesium: 1.8 mg/dL (ref 1.7–2.4)

## 2015-05-31 MED ORDER — POTASSIUM CHLORIDE CRYS ER 20 MEQ PO TBCR
40.0000 meq | EXTENDED_RELEASE_TABLET | ORAL | Status: AC
  Administered 2015-05-31 (×2): 40 meq via ORAL
  Filled 2015-05-31 (×2): qty 2

## 2015-05-31 MED ORDER — SPIRONOLACTONE 100 MG PO TABS
100.0000 mg | ORAL_TABLET | Freq: Two times a day (BID) | ORAL | Status: DC
  Administered 2015-05-31 – 2015-06-05 (×10): 100 mg via ORAL
  Filled 2015-05-31 (×14): qty 1

## 2015-05-31 MED ORDER — FUROSEMIDE 10 MG/ML IJ SOLN
40.0000 mg | Freq: Two times a day (BID) | INTRAMUSCULAR | Status: DC
  Administered 2015-05-31 – 2015-06-02 (×5): 40 mg via INTRAVENOUS
  Filled 2015-05-31 (×7): qty 4

## 2015-05-31 MED ORDER — FLUCONAZOLE IN SODIUM CHLORIDE 200-0.9 MG/100ML-% IV SOLN
200.0000 mg | INTRAVENOUS | Status: DC
  Administered 2015-05-31 – 2015-06-03 (×4): 200 mg via INTRAVENOUS
  Filled 2015-05-31 (×4): qty 100

## 2015-05-31 MED ORDER — NYSTATIN 100000 UNIT/GM EX POWD
Freq: Three times a day (TID) | CUTANEOUS | Status: DC
  Administered 2015-05-31 – 2015-06-05 (×15): via TOPICAL
  Filled 2015-05-31: qty 15

## 2015-05-31 MED ORDER — TRAMADOL HCL 50 MG PO TABS
50.0000 mg | ORAL_TABLET | Freq: Three times a day (TID) | ORAL | Status: DC | PRN
  Administered 2015-06-01 – 2015-06-04 (×5): 50 mg via ORAL
  Filled 2015-05-31 (×6): qty 1

## 2015-05-31 NOTE — Progress Notes (Signed)
PROGRESS NOTE    SACHI BOULAY  JYN:[id number] DOB: October 26, 1960 DOA: 05/18/2015  PCP: No primary care provider on file.   Brief Narrative:  With a history of multiple sclerosis, bipolar disorder, depression and anxiety, presents emergency department with complaints of abdominal pain, nausea and vomiting, diarrhea. She was tachycardic, tachypneic, WBC count 13, lactic acid 5.4. Source suspected to be SBP vs UTI. U cx grew Klebsiella and E coli which was treated for 9 days of appropriate antibiotics. WBC count never improved but other signs of sepsis resolved.   Assessment & Plan:   Principal Problem:    Sepsis - treated for Klebsiella and E coli UTI with Zosyn and Rocephin for 9 days - paracentesis done in ER but no cell count sent - 9 days of treatment should be sufficient - no tenderness in abdomen on exam now - has severe candida dermatitis on buttocks which may be why her WBC count is still 17- has been getting nystatin ointment- will add Diflucan  Active Problems:  Encephalopathy, hepatic  - Xifaxin, Lactulose ordered-  she is refusing Lactulose- seems stable just on Xifaxin- will d/c Lactulose  Alcoholic cirrhosis with ascites, macrocytic anemia, mild thrombocytopenia, alcoholic hepatitis - cont Lasix and Aldactone- will need to give Lasix IV and increase to 40 BID as abdomen is quite distended with ascites - jaundice with severely elevated Bilirubin (13-14) - Maddrey's discriminant function is 22  ETOH withdrawal - resolved  Hypokalemia - due to lasix and poor PO intake - cont to replace  Anxiety/depression/bipolar disorder - Continue Cogentin, Risperdal, Prozac Psych consulted for capacity eval, patient has intermittently refusing meds/testing/refusing snf placement  Multiple sclerosis/ADHD -  Provigil on hold due to elevated LFTs  Need outpatient neurology follow up, patient was last seen at [facility] neurology Dr Allena Earing for MS.  Tobacco abuse - nicotine  patch stopped on 4/15  Hypoalbuminemia/ severe protein calorie malnutrition - cont supplements   DVT prophylaxis: Lovenox Code Status: Full code Family Communication: Disposition Plan: home vs SNF   Consultants:   none  Procedures:   Paracentesis on admission  Antimicrobials:  Anti-infectives    Start     Dose/Rate Route Frequency Ordered Stop   05/31/15 1000  fluconazole (DIFLUCAN) IVPB 200 mg     200 mg 100 mL/hr over 60 Minutes Intravenous Every 24 hours 05/31/15 0857     05/23/15 1400  rifaximin (XIFAXAN) tablet 550 mg     550 mg Oral 2 times daily 05/23/15 1251     05/23/15 1400  cefTRIAXone (ROCEPHIN) 2 g in dextrose 5 % 50 mL IVPB  Status:  Discontinued     2 g 100 mL/hr over 30 Minutes Intravenous Every 24 hours 05/23/15 1310 05/26/15 1509   05/21/15 1400  cefTRIAXone (ROCEPHIN) 1 g in dextrose 5 % 50 mL IVPB  Status:  Discontinued     1 g 100 mL/hr over 30 Minutes Intravenous Every 24 hours 05/21/15 1333 05/23/15 1310   05/18/15 2200  piperacillin-tazobactam (ZOSYN) IVPB 3.375 g  Status:  Discontinued     3.375 g 12.5 mL/hr over 240 Minutes Intravenous 3 times per day 05/18/15 1550 05/21/15 1333   05/18/15 1545  piperacillin-tazobactam (ZOSYN) IVPB 3.375 g  Status:  Discontinued     3.375 g 100 mL/hr over 30 Minutes Intravenous  Once 05/18/15 1531 05/18/15 1549   05/18/15 1115  cefTRIAXone (ROCEPHIN) 1 g in dextrose 5 % 50 mL IVPB  Status:  Discontinued     1 g  100 mL/hr over 30 Minutes Intravenous  Once 05/18/15 1109 05/18/15 1109   05/18/15 1115  piperacillin-tazobactam (ZOSYN) IVPB 3.375 g     3.375 g 12.5 mL/hr over 240 Minutes Intravenous  Once 05/18/15 1110 05/18/15 1310      Subjective: No complaints. Specifically no nausea, vomiting, constipation, diarrhea or pain.   Objective: Filed Vitals:   05/30/15 1419 05/30/15 2055 05/31/15 0523 05/31/15 0836  BP: 132/81 113/68 111/56 99/68  Pulse: 93 85 80 82  Temp: 97.4 F (36.3 C) 97.5 F (36.4 C)  97.9 F (36.6 C) 97 F (36.1 C)  TempSrc: Oral Oral  Oral  Resp: Height:      Weight:      SpO2: 96% 97% 94% 95%    Intake/Output Summary (Last 24 hours) at 05/31/15 1219 Last data filed at 05/31/15 1125  Gross per 24 hour  Intake    240 ml  Output    700 ml  Net   -460 ml   Filed Weights   05/28/15 0601 05/29/15 0453 05/30/15 0900  Weight: 91.6 kg (201 lb 15.1 oz) 86.183 kg (190 lb) 93.441 kg (206 lb)    Examination: General exam: Appears calm and comfortable  Respiratory system: Clear to auscultation. Respiratory effort normal. Cardiovascular system: S1 & S2 heard, RRR. No JVD, murmurs, rubs, gallops or clicks. No pedal edema. Gastrointestinal system: Abdomen is nondistended, soft and nontender. No organomegaly or masses felt. Normal bowel sounds heard. Central nervous system: Alert and oriented. No focal neurological deficits. Extremities: Symmetric 5 x 5 power. Skin: bright red confluent rash on buttocks with pustular lesions Psychiatry: Judgement and insight appear normal. Mood & affect appropriate.     Data Reviewed: I have personally reviewed following labs and imaging studies  CBC:  Recent Labs Lab 05/27/15 0515 05/28/15 0517 05/29/15 0538 05/30/15 1248  WBC 17.9* 18.1* 17.2* 17.5*  HGB 12.8 12.7 12.2 13.0  HCT 37.6 37.0 35.8*  36.8 37.7  MCV 107.1* 106.9* 105.3* 106.8*  PLT 121* 125* 130* 131*   Basic Metabolic Panel:  Recent Labs Lab 05/25/15 0545  05/28/15 0517 05/28/15 0935 05/29/15 0538 05/30/15 0521 05/31/15 0527  NA 137  < > 129* 133* 133* 133* 133*  K 2.8*  < > 4.8 3.7 3.3* 3.4* 3.2*  CL 100*  < > 97* 99* 100* 100* 99*  CO2 27  < > GLUCOSE 121*  < > 74 94 90 92 94  BUN 8  < > CREATININE 0.36*  < > <0.30* 0.36* <0.30* 0.30* <0.30*  CALCIUM 8.1*  < > 7.9* 8.3* 8.2* 8.2* 8.5*  MG 2.4  --  1.9  --  1.8  --  1.8  < > = values in this interval not displayed. GFR: CrCl cannot be calculated  (Patient has no serum creatinine result on file.). Liver Function Tests:  Recent Labs Lab 05/27/15 0515 05/28/15 0517 05/29/15 0538 05/30/15 0521 05/31/15 0527  AST 122* 158* 113* 119* 125*  ALT 58* 56* 56* 50 57*  ALKPHOS 237* 260* 232* 234* 240*  BILITOT 12.1* 12.1* 13.0* 13.0* 14.5*  PROT 5.5* 5.1* 5.5* 5.5* 6.0*  ALBUMIN 1.8* 1.8* 1.8* 1.9* 2.0*   No results for input(s): LIPASE, AMYLASE in the last 168 hours.  Recent Labs Lab 05/31/15 0528  AMMONIA 49*   Coagulation Profile:  Recent Labs Lab 05/30/15 0521  INR 1.42   Cardiac Enzymes: No  results for input(s): CKTOTAL, CKMB, CKMBINDEX, TROPONINI in the last 168 hours. BNP (last 3 results) No results for input(s): PROBNP in the last 8760 hours. HbA1C: No results for input(s): HGBA1C in the last 72 hours. CBG: No results for input(s): GLUCAP in the last 168 hours. Lipid Profile: No results for input(s): CHOL, HDL, LDLCALC, TRIG, CHOLHDL, LDLDIRECT in the last 72 hours. Thyroid Function Tests: No results for input(s): TSH, T4TOTAL, FREET4, T3FREE, THYROIDAB in the last 72 hours. Anemia Panel:  Recent Labs  05/29/15 0538  VITAMINB12 2387*   Urine analysis:    Component Value Date/Time   COLORURINE ORANGE* 05/28/2015 1419   APPEARANCEUR CLOUDY* 05/28/2015 1419   LABSPEC 1.013 05/28/2015 1419   PHURINE 6.0 05/28/2015 1419   GLUCOSEU NEGATIVE 05/28/2015 1419   HGBUR SMALL* 05/28/2015 1419   BILIRUBINUR LARGE* 05/28/2015 1419   KETONESUR NEGATIVE 05/28/2015 1419   PROTEINUR NEGATIVE 05/28/2015 1419   UROBILINOGEN 0.2 09/14/2011 2141   NITRITE NEGATIVE 05/28/2015 1419   LEUKOCYTESUR TRACE* 05/28/2015 1419   Sepsis Labs: @LABRCNTIP (procalcitonin:4,lacticidven:4)  )No results found for this or any previous visit (from the past 240 hour(s)).       Radiology Studies: No results found.      Scheduled Meds: . amantadine  100 mg Oral BID  . antiseptic oral rinse  7 mL Mouth Rinse BID  .  enoxaparin (LOVENOX) injection  40 mg Subcutaneous Q24H  . feeding supplement (PRO-STAT SUGAR FREE 64)  30 mL Oral BID  . fluconazole (DIFLUCAN) IV  200 mg Intravenous Q24H  . folic acid  1 mg Oral Daily  . furosemide  40 mg Intravenous Q12H  . Gerhardt's butt cream   Topical BID  . lactulose  30 g Oral TID  . mometasone-formoterol  2 puff Inhalation BID  . nystatin   Topical BID  . pantoprazole  40 mg Oral Daily  . rifaximin  550 mg Oral BID  . risperiDONE  1 mg Oral QHS  . spironolactone  100 mg Oral BID  . thiamine  100 mg Oral Daily  . traZODone  25 mg Oral QHS   Continuous Infusions:    LOS: 13 days    Time spent in minutes: 45    Zerrick Hanssen, MD [facility] Pager: [email or web address] Password [facility] 05/31/2015, 12:19 PM

## 2015-05-31 NOTE — Progress Notes (Signed)
PROGRESS NOTE    Anne Black  ZOX:[id number] DOB: 1960/11/26 DOA: 05/18/2015  PCP: No primary care provider on file.   Brief Narrative:  With a history of multiple sclerosis, bipolar disorder, depression and anxiety, presents emergency department with complaints of abdominal pain, nausea and vomiting, diarrhea. She was tachycardic, tachypneic, WBC count 13, lactic acid 5.4. Source suspected to be SBP vs UTI. U cx grew Klebsiella and E coli which was treated for 9 days of appropriate antibiotics. WBC count never improved but other signs of sepsis resolved.   Assessment & Plan:   Principal Problem:   Sepsis due to UTI vs SBP - treated for Klebsiella and E coli UTI with Zosyn and Rocephin for 9 days - paracentesis done in ER but no cell count sent - 9 days of treatment should be sufficient - abdomen non tender - has severe candida dermatitis on buttocks which may be why her WBC count is still 17- has been getting nystatin ointment-  added Diflucan on 4/22- WBC improving-   Active Problems: Encephalopathy, hepatic  - Xifaxin- she is refusing Lactulose- seems stable just on Xifaxin-  d/c'd Lactulose  alcoholic cirrhosis with ascites and mild thrombocytopenia - cont Lasix and Aldactone- increased Lasix to 40 IV BID  (from 40 mg oral daily) as abdomen is quite distended with ascites - jaundice with severely elevated Bilirubin (13-14) - Maddrey's discriminant function is 22  ETOH withdrawal - resolved  Hypokalemia - due to lasix and poor PO intake - cont to replace  Anxiety/depression/bipolar disorder - Continue Cogentin, Risperdal, Prozac Psych consulted for capacity eval, patient has intermittently refusing meds/testing/refusing snf placement- per nursing she is much better this week as compared to last week- psych noted on 4/22 that she has capacity to make medical decisions but need to follow with outpt psych  Multiple sclerosis/ADHD -  Provigil on hold due to elevated LFTs    Tobacco abuse - nicotine patch stopped on 4/15  Hypoalbuminemia/ severe protein calorie malnutrition - cont supplements   DVT prophylaxis: Lovenox Code Status: Full code Family Communication:  Disposition Plan: likely SNF   Consultants:   psych  Procedures:   Paracentesis on admission  Antimicrobials:  Anti-infectives    Start     Dose/Rate Route Frequency Ordered Stop   05/23/15 1400  rifaximin (XIFAXAN) tablet 550 mg     550 mg Oral 2 times daily 05/23/15 1251     05/23/15 1400  cefTRIAXone (ROCEPHIN) 2 g in dextrose 5 % 50 mL IVPB  Status:  Discontinued     2 g 100 mL/hr over 30 Minutes Intravenous Every 24 hours 05/23/15 1310 05/26/15 1509   05/21/15 1400  cefTRIAXone (ROCEPHIN) 1 g in dextrose 5 % 50 mL IVPB  Status:  Discontinued     1 g 100 mL/hr over 30 Minutes Intravenous Every 24 hours 05/21/15 1333 05/23/15 1310   05/18/15 2200  piperacillin-tazobactam (ZOSYN) IVPB 3.375 g  Status:  Discontinued     3.375 g 12.5 mL/hr over 240 Minutes Intravenous 3 times per day 05/18/15 1550 05/21/15 1333   05/18/15 1545  piperacillin-tazobactam (ZOSYN) IVPB 3.375 g  Status:  Discontinued     3.375 g 100 mL/hr over 30 Minutes Intravenous  Once 05/18/15 1531 05/18/15 1549   05/18/15 1115  cefTRIAXone (ROCEPHIN) 1 g in dextrose 5 % 50 mL IVPB  Status:  Discontinued     1 g 100 mL/hr over 30 Minutes Intravenous  Once 05/18/15 1109 05/18/15 1109   05/18/15  1115  piperacillin-tazobactam (ZOSYN) IVPB 3.375 g     3.375 g 12.5 mL/hr over 240 Minutes Intravenous  Once 05/18/15 1110 05/18/15 1310      Subjective: Has a headache, no nausea, vomiting, cough or congestion.   Objective: Filed Vitals:   05/30/15 0939 05/30/15 1419 05/30/15 2055 05/31/15 0523  BP:  132/81 113/68 111/56  Pulse:  93 85 80  Temp:  97.4 F (36.3 C) 97.5 F (36.4 C) 97.9 F (36.6 C)  TempSrc:  Oral Oral   Resp:  20 20 20   Height:      Weight:      SpO2: 96% 96% 97% 94%    Intake/Output  Summary (Last 24 hours) at 05/31/15 0807 Last data filed at 05/30/15 0900  Gross per 24 hour  Intake      0 ml  Output     75 ml  Net    -75 ml   Filed Weights   05/28/15 0601 05/29/15 0453 05/30/15 0900  Weight: 91.6 kg (201 lb 15.1 oz) 86.183 kg (190 lb) 93.441 kg (206 lb)    Examination: General exam: Appears calm and comfortable  Respiratory system: Clear to auscultation. Respiratory effort normal. Cardiovascular system: S1 & S2 heard, RRR. No JVD, murmurs, rubs, gallops or clicks. No pedal edema. Gastrointestinal system: Abdomen is distended, soft and nontender. No organomegaly or masses felt. Normal bowel sounds heard. Central nervous system: Alert and oriented. No focal neurological deficits. Extremities: 2+ edema Skin: No rashes, lesions or ulcers Psychiatry: Judgement and insight appear normal. Mood & affect appropriate.     Data Reviewed: I have personally reviewed following labs and imaging studies  CBC:  Recent Labs Lab 05/27/15 0515 05/28/15 0517 05/29/15 0538 05/30/15 1248  WBC 17.9* 18.1* 17.2* 17.5*  HGB 12.8 12.7 12.2 13.0  HCT 37.6 37.0 35.8*  36.8 37.7  MCV 107.1* 106.9* 105.3* 106.8*  PLT 121* 125* 130* 131*   Basic Metabolic Panel:  Recent Labs Lab 05/25/15 0545  05/28/15 0517 05/28/15 0935 05/29/15 0538 05/30/15 0521 05/31/15 0527  NA 137  < > 129* 133* 133* 133* 133*  K 2.8*  < > 4.8 3.7 3.3* 3.4* 3.2*  CL 100*  < > 97* 99* 100* 100* 99*  CO2 27  < > 22 23 23 23 23   GLUCOSE 121*  < > 74 94 90 92 94  BUN 8  < > 7 7 7 6 6   CREATININE 0.36*  < > <0.30* 0.36* <0.30* 0.30* <0.30*  CALCIUM 8.1*  < > 7.9* 8.3* 8.2* 8.2* 8.5*  MG 2.4  --  1.9  --  1.8  --  1.8  < > = values in this interval not displayed. GFR: CrCl cannot be calculated (Patient has no serum creatinine result on file.). Liver Function Tests:  Recent Labs Lab 05/27/15 0515 05/28/15 0517 05/29/15 0538 05/30/15 0521 05/31/15 0527  AST 122* 158* 113* 119* 125*  ALT  58* 56* 56* 50 57*  ALKPHOS 237* 260* 232* 234* 240*  BILITOT 12.1* 12.1* 13.0* 13.0* 14.5*  PROT 5.5* 5.1* 5.5* 5.5* 6.0*  ALBUMIN 1.8* 1.8* 1.8* 1.9* 2.0*   No results for input(s): LIPASE, AMYLASE in the last 168 hours.  Recent Labs Lab 05/31/15 0528  AMMONIA 49*   Coagulation Profile:  Recent Labs Lab 05/30/15 0521  INR 1.42   Cardiac Enzymes: No results for input(s): CKTOTAL, CKMB, CKMBINDEX, TROPONINI in the last 168 hours. BNP (last 3 results) No results for input(s): PROBNP in  the last 8760 hours. HbA1C: No results for input(s): HGBA1C in the last 72 hours. CBG: No results for input(s): GLUCAP in the last 168 hours. Lipid Profile: No results for input(s): CHOL, HDL, LDLCALC, TRIG, CHOLHDL, LDLDIRECT in the last 72 hours. Thyroid Function Tests: No results for input(s): TSH, T4TOTAL, FREET4, T3FREE, THYROIDAB in the last 72 hours. Anemia Panel:  Recent Labs  05/29/15 0538  VITAMINB12 2387*   Urine analysis:    Component Value Date/Time   COLORURINE ORANGE* 05/28/2015 1419   APPEARANCEUR CLOUDY* 05/28/2015 1419   LABSPEC 1.013 05/28/2015 1419   PHURINE 6.0 05/28/2015 1419   GLUCOSEU NEGATIVE 05/28/2015 1419   HGBUR SMALL* 05/28/2015 1419   BILIRUBINUR LARGE* 05/28/2015 1419   KETONESUR NEGATIVE 05/28/2015 1419   PROTEINUR NEGATIVE 05/28/2015 1419   UROBILINOGEN 0.2 09/14/2011 2141   NITRITE NEGATIVE 05/28/2015 1419   LEUKOCYTESUR TRACE* 05/28/2015 1419   Sepsis Labs: @LABRCNTIP (procalcitonin:4,lacticidven:4)  )No results found for this or any previous visit (from the past 240 hour(s)).       Radiology Studies: No results found.      Scheduled Meds: . amantadine  100 mg Oral BID  . antiseptic oral rinse  7 mL Mouth Rinse BID  . enoxaparin (LOVENOX) injection  40 mg Subcutaneous Q24H  . feeding supplement (PRO-STAT SUGAR FREE 64)  30 mL Oral BID  . folic acid  1 mg Oral Daily  . furosemide  40 mg Oral Daily  . Gerhardt's butt cream    Topical BID  . lactulose  30 g Oral TID  . mometasone-formoterol  2 puff Inhalation BID  . nystatin   Topical BID  . pantoprazole  40 mg Oral Daily  . potassium chloride  40 mEq Oral Q4H  . rifaximin  550 mg Oral BID  . risperiDONE  1 mg Oral QHS  . spironolactone  100 mg Oral Daily  . thiamine  100 mg Oral Daily  . traZODone  25 mg Oral QHS   Continuous Infusions:    LOS: 13 days    Time spent in minutes: 35    Ladislav Caselli, MD [facility] Pager: [email or web address] Password [facility] 06/01/2015, 8:07 AM

## 2015-05-31 NOTE — Consult Note (Signed)
[name]   Reason for Consult:  Capacity evaluation Referring Physician:  Dr. Wynelle Cleveland [name] MRN:  [id number] Principal Diagnosis: Sepsis [facility]) Diagnosis:   Patient Active Problem List   Diagnosis Date Noted  . Hypokalemia [E87.6] 05/26/2015  . Encephalopathy, hepatic ([place]) [K72.90] 05/26/2015  . Alcohol withdrawal ([place]) [F10.239] 05/18/2015  . Abdominal pain [R10.9] 05/18/2015  . Anxiety [F41.9] 05/18/2015  . Sepsis ([place]) [A41.9] 05/18/2015  . UTI (lower urinary tract infection) [N39.0] 05/18/2015  . Hepatic cirrhosis ([place]) [K74.60] 05/18/2015  . Stimulant abuse [F15.10] 12/27/2014  . Nicotine abuse [F19.10] 12/27/2014  . Hyperprolactinemia ([place]) [E22.1] 11/15/2014  . Bipolar disorder, curr episode mixed, severe, w/o psychotic features ([place]) [F31.63] 11/13/2014  . Dyslipidemia [E78.5]   . Alcohol use disorder, moderate, dependence ([place]) [F10.20] 10/30/2014  . Noncompliance with therapeutic plan [Z91.11] 04/04/2013  . Unspecified hereditary and idiopathic peripheral neuropathy [G60.9] 05/01/2012  . GERD (gastroesophageal reflux disease) [K21.9] 05/01/2012  . Osteoarthrosis, unspecified whether generalized or localized, involving lower leg [M17.9] 05/01/2012  . MS (multiple sclerosis) ([place]) [G35] 09/14/2011    Total Time spent with patient: 45 minutes  Subjective:   Anne Black is a 55 y.o. female patient admitted with diarrhea with nausea and vomiting .  HPI:  Anne Black is a 55 years old female, disabled, living by herself and has been suffering with a history of multiple sclerosis, bipolar disorder, depression and anxiety, and ETOH dependence presented to the emergency department 3 episodes of diarrhea with nausea and vomiting in the last day. She also complained of some burning abdominal pain which is been constant, more so in the upper right quadrant. Patient has been to the emergency department twice this past week  for similar pr patient is known to this provider from her previous hospital encounters for bipolar depression and was receiving medication management from [facility] psychiatric services. Patient is currently not in outpatient psychiatric services and has been relapsed and drug of abuse which is alcohol and tobacco. Patient also states that she would like to undergo alcohol detox. Patient denies current symptoms of depression, mania, psychosis, suicidal/homicidal ideation, intention or plans. Patient has intact cognition, memory, language functions but poor judgment and impulse control.  Past Psychiatric History: Patient has been diagnosed with bipolar disorder, and alcohol dependence [place] last admission on 11/2014.  Risk to Self: Is patient at risk for suicide?: No Risk to Others:   Prior Inpatient Therapy:   Prior Outpatient Therapy:    Past Medical History:  Past Medical History  Diagnosis Date  . Multiple sclerosis ([place])   . Arthritis   . Anxiety   . Bipolar disorder ([place])   . ADHD (attention deficit hyperactivity disorder)   . Migraine   . Osteoporosis     Past Surgical History  Procedure Laterality Date  . Cesarean section  [id number]  . Myringotomy with tube placement Bilateral   . Tonsillectomy     Family History:  Family History  Problem Relation Age of Onset  . Heart disease Mother   . Heart disease Father   . Hypertension Father    Family Psychiatric  History: Unknown Social History:  History  Alcohol Use  . 4.8 oz/week  . 2 Glasses of wine, 2 Cans of beer, 4 Standard drinks or equivalent per week    Comment: "I'm a social drinker." "I drink a glass of wine nightly."     History  Drug Use No    Social History  Social History  . Marital Status: Divorced    Spouse Name: n/a  . Number of Children: 5  . Years of Education: College   Occupational History  . disabled     on social security disability for MS   Social History Main Topics  . Smoking status: Current  Every Day Smoker -- 0.75 packs/day for 20 years    Types: Cigarettes  . Smokeless tobacco: Never Used     Comment: is cutting back, not in the car, house  . Alcohol Use: 4.8 oz/week    2 Glasses of wine, 2 Cans of beer, 4 Standard drinks or equivalent per week     Comment: "I'm a social drinker." "I drink a glass of wine nightly."  . Drug Use: No  . Sexual Activity: Not Currently   Other Topics Concern  . None   Social History Narrative   Former Teaching laboratory technician.   Started graduate school in counseling, but didn't finish when a good job came open.   Lives alone.   One daughter lives in [place].   Another daughter is in [place].   The other three live in [place], along with her mother.   Father lives in the [place], [place] area.   Additional Social History:    Allergies:  No Known Allergies  Labs:  Results for orders placed or performed during the hospital encounter of 05/18/15 (from the past 48 hour(s))  Basic metabolic panel     Status: Abnormal   Collection Time: 05/30/15  5:21 AM  Result Value Ref Range   Sodium 133 (L) 135 - 145 mmol/L   Potassium 3.4 (L) 3.5 - 5.1 mmol/L   Chloride 100 (L) 101 - 111 mmol/L   CO2 23 22 - 32 mmol/L   Glucose, Bld 92 65 - 99 mg/dL   BUN 6 6 - 20 mg/dL   Creatinine, Ser 0.30 (L) 0.44 - 1.00 mg/dL   Calcium 8.2 (L) 8.9 - 10.3 mg/dL   GFR calc non Af Amer >60 >60 mL/min   GFR calc Af Amer >60 >60 mL/min    Comment: (NOTE) The eGFR has been calculated using the CKD EPI equation. This calculation has not been validated in all clinical situations. eGFR's persistently <60 mL/min signify possible Chronic Kidney Disease.    Anion gap 10 5 - 15  Protime-INR     Status: Abnormal   Collection Time: 05/30/15  5:21 AM  Result Value Ref Range   Prothrombin Time 16.9 (H) 11.6 - 15.2 seconds   INR 1.42 0.00 - 1.49  Hepatic function panel     Status: Abnormal   Collection Time: 05/30/15  5:21 AM  Result Value Ref Range   Total  Protein 5.5 (L) 6.5 - 8.1 g/dL   Albumin 1.9 (L) 3.5 - 5.0 g/dL   AST 119 (H) 15 - 41 U/L   ALT 50 14 - 54 U/L   Alkaline Phosphatase 234 (H) 38 - 126 U/L   Total Bilirubin 13.0 (H) 0.3 - 1.2 mg/dL   Bilirubin, Direct 7.6 (H) 0.1 - 0.5 mg/dL   Indirect Bilirubin 5.4 (H) 0.3 - 0.9 mg/dL  CBC     Status: Abnormal   Collection Time: 05/30/15 12:48 PM  Result Value Ref Range   WBC 17.5 (H) 4.0 - 10.5 K/uL   RBC 3.53 (L) 3.87 - 5.11 MIL/uL   Hemoglobin 13.0 12.0 - 15.0 g/dL   HCT 37.7 36.0 - 46.0 %   MCV 106.8 (H) 78.0 - 100.0  fL   MCH 36.8 (H) 26.0 - 34.0 pg   MCHC 34.5 30.0 - 36.0 g/dL   RDW 21.7 (H) 11.5 - 15.5 %   Platelets 131 (L) 150 - 400 K/uL    Comment: SPECIMEN CHECKED FOR CLOTS REPEATED TO VERIFY PLATELET COUNT CONFIRMED BY SMEAR   Comprehensive metabolic panel     Status: Abnormal   Collection Time: 05/31/15  5:27 AM  Result Value Ref Range   Sodium 133 (L) 135 - 145 mmol/L   Potassium 3.2 (L) 3.5 - 5.1 mmol/L   Chloride 99 (L) 101 - 111 mmol/L   CO2 23 22 - 32 mmol/L   Glucose, Bld 94 65 - 99 mg/dL   BUN 6 6 - 20 mg/dL   Creatinine, Ser <0.30 (L) 0.44 - 1.00 mg/dL   Calcium 8.5 (L) 8.9 - 10.3 mg/dL   Total Protein 6.0 (L) 6.5 - 8.1 g/dL   Albumin 2.0 (L) 3.5 - 5.0 g/dL   AST 125 (H) 15 - 41 U/L   ALT 57 (H) 14 - 54 U/L   Alkaline Phosphatase 240 (H) 38 - 126 U/L   Total Bilirubin 14.5 (H) 0.3 - 1.2 mg/dL   GFR calc non Af Amer NOT CALCULATED >60 mL/min   GFR calc Af Amer NOT CALCULATED >60 mL/min    Comment: (NOTE) The eGFR has been calculated using the CKD EPI equation. This calculation has not been validated in all clinical situations. eGFR's persistently <60 mL/min signify possible Chronic Kidney Disease.    Anion gap 11 5 - 15  Magnesium     Status: None   Collection Time: 05/31/15  5:27 AM  Result Value Ref Range   Magnesium 1.8 1.7 - 2.4 mg/dL  Ammonia     Status: Abnormal   Collection Time: 05/31/15  5:28 AM  Result Value Ref Range   Ammonia 49  (H) 9 - 35 umol/L    Current Facility-Administered Medications  Medication Dose Route Frequency Provider Last Rate Last Dose  . albuterol (PROVENTIL) (2.5 MG/3ML) 0.083% nebulizer solution 3 mL  3 mL Inhalation Q6H PRN Maryann Mikhail, DO      . amantadine (SYMMETREL) capsule 100 mg  100 mg Oral BID Maryann Mikhail, DO   100 mg at 05/31/15 2751  . antiseptic oral rinse (CPC / CETYLPYRIDINIUM CHLORIDE 0.05%) solution 7 mL  7 mL Mouth Rinse BID Caren Griffins, MD   7 mL at 05/31/15 0929  . diphenhydrAMINE (BENADRYL) capsule 25 mg  25 mg Oral Q8H PRN Gardiner Barefoot, NP   25 mg at 05/28/15 2300  . enoxaparin (LOVENOX) injection 40 mg  40 mg Subcutaneous Q24H Maryann Mikhail, DO   40 mg at 05/30/15 1651  . feeding supplement (PRO-STAT SUGAR FREE 64) liquid 30 mL  30 mL Oral BID Florencia Reasons, MD   30 mL at 05/28/15 1154  . fluconazole (DIFLUCAN) IVPB 200 mg  200 mg Intravenous Q24H Debbe Odea, MD   200 mg at 05/31/15 1024  . folic acid (FOLVITE) tablet 1 mg  1 mg Oral Daily Maryann Mikhail, DO   1 mg at 05/31/15 7001  . furosemide (LASIX) injection 40 mg  40 mg Intravenous Q12H Debbe Odea, MD   40 mg at 05/31/15 1025  . Gerhardt's butt cream   Topical BID Verlee Monte, MD      . ibuprofen (ADVIL,MOTRIN) tablet 400 mg  400 mg Oral BID PRN Florencia Reasons, MD   400 mg at 05/30/15 2151  .  lactulose (CHRONULAC) 10 GM/15ML solution 30 g  30 g Oral TID Florencia Reasons, MD   30 g at 05/29/15 2126  . mometasone-formoterol (DULERA) 200-5 MCG/ACT inhaler 2 puff  2 puff Inhalation BID Maryann Mikhail, DO   2 puff at 05/31/15 0836  . nystatin (MYCOSTATIN/NYSTOP) topical powder   Topical BID Florencia Reasons, MD      . ondansetron [facility]) tablet 4 mg  4 mg Oral Q6H PRN Maryann Mikhail, DO   4 mg at 05/30/15 1955   Or  . ondansetron (ZOFRAN) injection 4 mg  4 mg Intravenous Q6H PRN Maryann Mikhail, DO   4 mg at 05/27/15 0544  . pantoprazole (PROTONIX) EC tablet 40 mg  40 mg Oral Daily Verlee Monte, MD   40 mg at 05/31/15 0923  .  potassium chloride SA (K-DUR,KLOR-CON) CR tablet 40 mEq  40 mEq Oral Q4H Debbe Odea, MD   40 mEq at 05/31/15 0847  . rifaximin (XIFAXAN) tablet 550 mg  550 mg Oral BID Verlee Monte, MD   550 mg at 05/31/15 0922  . risperiDONE (RISPERDAL) tablet 1 mg  1 mg Oral QHS Verlee Monte, MD   1 mg at 05/30/15 2151  . spironolactone (ALDACTONE) tablet 100 mg  100 mg Oral BID Debbe Odea, MD      . thiamine (VITAMIN B-1) tablet 100 mg  100 mg Oral Daily Maryann Mikhail, DO   100 mg at 05/31/15 4562  . traMADol (ULTRAM) tablet 50 mg  50 mg Oral Q8H PRN Debbe Odea, MD      . traZODone (DESYREL) tablet 25 mg  25 mg Oral QHS Verlee Monte, MD   25 mg at 05/30/15 2150    Musculoskeletal: Strength & Muscle Tone: within normal limits Gait & Station: normal Patient leans: N/A  Psychiatric Specialty Exam: ROS  No Fever-chills, No Headache, No changes with Vision or hearing, reports vertigo No problems swallowing food or Liquids, No Chest pain, Cough or Shortness of Breath, No Abdominal pain, No Nausea or Vommitting, Bowel movements are regular, No Blood in stool or Urine, No dysuria, No new skin rashes or bruises, No new joints pains-aches,  No new weakness, tingling, numbness in any extremity, No recent weight gain or loss, No polyuria, polydypsia or polyphagia,   A full 10 point Review of Systems was done, except as stated above, all other Review of Systems were negative.  Blood pressure 111/56, pulse 82, temperature 97.9 F (36.6 C), temperature source Oral, resp. rate 18, height '5\' 2"'  (1.575 m), weight 93.441 kg (206 lb), SpO2 95 %.Body mass index is 37.67 kg/(m^2).  General Appearance: Casual  Eye Contact::  Good  Speech:  Clear and Coherent  Volume:  Normal  Mood:  Euthymic  Affect:  Appropriate and Congruent  Thought Process:  Coherent and Goal Directed  Orientation:  Full (Time, Place, and Person)  Thought Content:  WDL  Suicidal Thoughts:  No  Homicidal Thoughts:  No  Memory:   Immediate;   Good Recent;   Fair  Judgement:  Fair  Insight:  Good and Fair  Psychomotor Activity:  Decreased  Concentration:  Good  Recall:  Good  Fund of Knowledge:Good  Language: Good  Akathisia:  Negative  Handed:  Right  AIMS (if indicated):     Assets:  Communication Skills Desire for Improvement Financial Resources/Insurance Housing Leisure Time Resilience Social Support Transportation  ADL's:  Intact  Cognition: WNL  Sleep:      Treatment Plan Summary: Patient meets criteria for  capacity to make her own medical decisions and living arrangements based on my evaluation today.  Patient will be referred to the outpatient psychiatric medication management for bipolar depression and multiple sclerosis.   Continue risperidone and trazodone as prescribed   Counseled to stay sober and alcohol and nicotine abuse.  Monitor for alcohol withdrawal symptoms  Appreciate psychiatric consultation and we sign off as of today Please contact [phone] or [phone] if needs further assistance   Disposition: Patient can be referred to the outpatient medication management when medically stable.  Patient does not meet criteria for psychiatric inpatient admission. Supportive therapy provided about ongoing stressors.  Durward Parcel., MD 05/31/2015 10:53 AM

## 2015-06-01 DIAGNOSIS — B372 Candidiasis of skin and nail: Secondary | ICD-10-CM

## 2015-06-01 LAB — COMPREHENSIVE METABOLIC PANEL
ALK PHOS: 215 U/L — AB (ref 38–126)
ALT: 55 U/L — ABNORMAL HIGH (ref 14–54)
ANION GAP: 10 (ref 5–15)
AST: 117 U/L — ABNORMAL HIGH (ref 15–41)
Albumin: 1.9 g/dL — ABNORMAL LOW (ref 3.5–5.0)
BUN: 5 mg/dL — ABNORMAL LOW (ref 6–20)
CALCIUM: 8.3 mg/dL — AB (ref 8.9–10.3)
CHLORIDE: 102 mmol/L (ref 101–111)
CO2: 24 mmol/L (ref 22–32)
Creatinine, Ser: 0.37 mg/dL — ABNORMAL LOW (ref 0.44–1.00)
GFR calc Af Amer: >60 mL/min (ref >60)
Glucose, Bld: 97 mg/dL (ref 65–99)
Potassium: 3.3 mmol/L — ABNORMAL LOW (ref 3.5–5.1)
SODIUM: 136 mmol/L (ref 135–145)
Total Bilirubin: 13.1 mg/dL — ABNORMAL HIGH (ref 0.3–1.2)
Total Protein: 5.8 g/dL — ABNORMAL LOW (ref 6.5–8.1)

## 2015-06-01 LAB — CBC
HCT: 36.9 % (ref 36.0–46.0)
Hemoglobin: 12.5 g/dL (ref 12.0–15.0)
MCH: 36.2 pg — AB (ref 26.0–34.0)
MCHC: 33.9 g/dL (ref 30.0–36.0)
MCV: 107 fL — ABNORMAL HIGH (ref 78.0–100.0)
PLATELETS: 130 10*3/uL — AB (ref 150–400)
RBC: 3.45 MIL/uL — ABNORMAL LOW (ref 3.87–5.11)
RDW: 20.6 % — ABNORMAL HIGH (ref 11.5–15.5)
WBC: 13.8 10*3/uL — ABNORMAL HIGH (ref 4.0–10.5)

## 2015-06-01 LAB — GLUCOSE, CAPILLARY: GLUCOSE-CAPILLARY: 94 mg/dL (ref 65–99)

## 2015-06-01 MED ORDER — MAGNESIUM SULFATE 2 GM/50ML IV SOLN
2.0000 g | Freq: Once | INTRAVENOUS | Status: AC
  Administered 2015-06-01: 2 g via INTRAVENOUS
  Filled 2015-06-01: qty 50

## 2015-06-01 MED ORDER — SIMETHICONE 80 MG PO CHEW
160.0000 mg | CHEWABLE_TABLET | Freq: Four times a day (QID) | ORAL | Status: DC | PRN
  Filled 2015-06-01: qty 2

## 2015-06-01 MED ORDER — POTASSIUM CHLORIDE CRYS ER 20 MEQ PO TBCR
40.0000 meq | EXTENDED_RELEASE_TABLET | ORAL | Status: AC
  Administered 2015-06-01 (×2): 40 meq via ORAL
  Filled 2015-06-01 (×2): qty 2

## 2015-06-02 LAB — COMPREHENSIVE METABOLIC PANEL
ALBUMIN: 1.9 g/dL — AB (ref 3.5–5.0)
ALK PHOS: 209 U/L — AB (ref 38–126)
ALT: 54 U/L (ref 14–54)
ANION GAP: 9 (ref 5–15)
AST: 110 U/L — ABNORMAL HIGH (ref 15–41)
BILIRUBIN TOTAL: 12.1 mg/dL — AB (ref 0.3–1.2)
BUN: 6 mg/dL (ref 6–20)
CALCIUM: 8.4 mg/dL — AB (ref 8.9–10.3)
CO2: 23 mmol/L (ref 22–32)
Chloride: 103 mmol/L (ref 101–111)
GLUCOSE: 108 mg/dL — AB (ref 65–99)
Potassium: 3.9 mmol/L (ref 3.5–5.1)
Sodium: 135 mmol/L (ref 135–145)
TOTAL PROTEIN: 5.5 g/dL — AB (ref 6.5–8.1)

## 2015-06-02 LAB — CBC
HCT: 36.1 % (ref 36.0–46.0)
HEMATOCRIT: 35.8 % — AB (ref 36.0–46.0)
HEMOGLOBIN: 12.3 g/dL (ref 12.0–15.0)
Hemoglobin: 12.2 g/dL (ref 12.0–15.0)
MCH: 36.6 pg — ABNORMAL HIGH (ref 26.0–34.0)
MCH: 37 pg — AB (ref 26.0–34.0)
MCHC: 34.1 g/dL (ref 30.0–36.0)
MCHC: 34.1 g/dL (ref 30.0–36.0)
MCV: 107.5 fL — ABNORMAL HIGH (ref 78.0–100.0)
MCV: 108.7 fL — ABNORMAL HIGH (ref 78.0–100.0)
PLATELETS: 138 10*3/uL — AB (ref 150–400)
Platelets: 135 10*3/uL — ABNORMAL LOW (ref 150–400)
RBC: 3.33 MIL/uL — AB (ref 3.87–5.11)
RBC: 3.32 MIL/uL — ABNORMAL LOW (ref 3.87–5.11)
RDW: 20.2 % — ABNORMAL HIGH (ref 11.5–15.5)
RDW: 20.2 % — AB (ref 11.5–15.5)
WBC: 14.1 10*3/uL — ABNORMAL HIGH (ref 4.0–10.5)
WBC: 13.1 10*3/uL — ABNORMAL HIGH (ref 4.0–10.5)

## 2015-06-02 MED ORDER — FLUOXETINE HCL 20 MG PO CAPS
20.0000 mg | ORAL_CAPSULE | Freq: Every day | ORAL | Status: DC
  Administered 2015-06-02 – 2015-06-05 (×4): 20 mg via ORAL
  Filled 2015-06-02 (×4): qty 1

## 2015-06-02 MED ORDER — FUROSEMIDE 10 MG/ML IJ SOLN
40.0000 mg | Freq: Three times a day (TID) | INTRAMUSCULAR | Status: DC
  Administered 2015-06-02: 40 mg via INTRAVENOUS
  Filled 2015-06-02 (×4): qty 4

## 2015-06-02 NOTE — Progress Notes (Signed)
Nutrition Follow-up  DOCUMENTATION CODES:   Obesity unspecified  INTERVENTION:  - Continue Prostat BID - Continue Regular diet with 1200cc fluid restriction - RD will continue to monitor for additional needs  NUTRITION DIAGNOSIS:   Inadequate oral intake related to other (see comment) (food preferences compared to current diet order) as evidenced by per patient/family report, meal completion < 50%. -resolved with diet liberalization  No new nutrition dx at this time.  GOAL:   Patient will meet greater than or equal to 90% of their needs -met at this time  MONITOR:   PO intake, Supplement acceptance, Weight trends, Labs, Skin, I & O's  ASSESSMENT:   55 y.o. female with a history of multiple sclerosis, bipolar disorder, depression and anxiety, presents emergency department with complaints of abdominal pain, nausea and vomiting, diarrhea. Patient has been to the emergency department twice this past week for similar presentation. She was seen on 05/13/2015 had ultrasound of the abdomen at that time refused admission. Patient then presented again 2 days ago for similar symptoms and left without being seen. She returned to the emergency department today with similar complaints and states she's had approximately 3 episodes of diarrhea with nausea and vomiting in the last day. She reports feeling "shaky". She's also complained of some burning abdominal pain which has been constant, more so in the upper right quadrant. The patient denied any urinary symptoms. Patient also states that she would like to undergo alcohol detox. She cannot quantify the amount of alcohol that she consumes it states that things have been very difficult for her recently. She denies any suicidal or homicidal ideations.  4/24 Diet liberalization since previous assessment with associated 100% intakes on average (50-100%). Pt meeting needs at this time with current intakes and supplement order. Per MD note, likely to d/c to  SNF when medically stable. Will continue to monitor for additional nutrition-related needs prior to d/c. Medications reviewed; 40 mg IV Lasix every 12 hours. Labs reviewed; creatining <0.3, Ca: 8.4 mg/dL, Alk Phos/AST elevated but trending down.     4/19 - Per chart review, pt has been consuming 10-35% of meals for the past 4 days. - She states she ate a few bites of breakfast this AM but that she has been unable to eat much since admission due to food preferences compared to allowed foods on current diet order (Heart Healthy).  - Pt reports that she has been following an Atkins-based diet for several years and has experienced frustration in inability to order preferred foods. - Talked with pt about Heart Healthy diet and the reason for order; talked with her about favorite foods that are in line with this diet order.  - RD feels that Heart Healthy diet is warranted for this pt but advised her to talk with MD should she continue to feel frustration concerning diet order.  - Unable to obtain information from PTA as pt's focus was on current frustrations surrounding diet order and timeliness of lunch being received.  - Physical assessment shows no muscle or fat wasting.  - Moderate edema present and noted ascites to abdominal area. MD note from 4/18 @ 1543 states pt with hepatic cirrhosis secondary to alcohol abuse with associated ascites, anasarca, and hepatic encephalopathy. - Pt not meeting needs; will order Prostat to assist in meeting protein needs.  Diet Order:  Diet regular Room service appropriate?: Yes; Fluid consistency:: Thin; Fluid restriction:: 1200 mL Fluid  Skin:  Wound (see comment) (Stage 2 ischial tuberosity)  Last  BM:  4/23  Height:   Ht Readings from Last 1 Encounters:  05/18/15 _0  (1.575 m)    Weight:   Wt Readings from Last 1 Encounters:  06/02/15 196 lb 6.5 oz (89.09 kg)    Ideal Body Weight:  50 kg (kg)  BMI:  Body mass index is 35.91 kg/(m^2).  Estimated  Nutritional Needs:   Kcal:  1500-1700  Protein:  85-95 grams  Fluid:  </= 1.5 L/day  EDUCATION NEEDS:   No education needs identified at this time     Jarome Matin, RD, LDN Inpatient Clinical Dietitian Pager # [phone] After hours/weekend pager # [phone]

## 2015-06-02 NOTE — Progress Notes (Signed)
PROGRESS NOTE    Anne Black  VHQ:[id number] DOB: 1960/07/10 DOA: 05/18/2015  PCP: Lucilla Edin, MD   Brief Narrative:  With a history of multiple sclerosis, bipolar disorder, depression and anxiety, presents emergency department with complaints of abdominal pain, nausea and vomiting, diarrhea. She was tachycardic, tachypneic, WBC count 13, lactic acid 5.4. Source suspected to be SBP vs UTI. U cx grew Klebsiella and E coli which was treated for 9 days of appropriate antibiotics. WBC count never improved but other signs of sepsis resolved.   Assessment & Plan:   Principal Problem:   Sepsis due to UTI vs SBP - treated for Klebsiella and E coli UTI with Zosyn and Rocephin for 9 days - paracentesis done in ER but no cell count sent - 9 days of treatment should be sufficient - abdomen non tender - has severe candida dermatitis on buttocks which may be why her WBC count is still 17- has been getting nystatin ointment-  added Diflucan on 4/22- WBC & candida dermatitis improving  Active Problems: Encephalopathy, hepatic  - Xifaxin- she is refusing Lactulose- seems stable just on Xifaxin-  d/c'd Lactulose  alcoholic cirrhosis with ascites and mild thrombocytopenia - cont Lasix and Aldactone- increased Lasix from 40 IV BID to 40 TID  as abdomen is quite distended with ascites- talked to her about limiting IVF - jaundice with severely elevated Bilirubin (13-14) - Maddrey's discriminant function is 22  ETOH withdrawal - resolved  Hypokalemia - due to lasix and poor PO intake - cont to replace  Anxiety/depression/bipolar disorder - Continue Cogentin, Risperdal, Prozac Psych consulted for capacity eval, patient has intermittently refusing meds/testing/refusing snf placement- per nursing she is much better this week as compared to last week- psych noted on 4/22 that she has capacity to make medical decisions but need to follow with outpt psych  Multiple sclerosis/ADHD -  Provigil on hold  due to elevated LFTs   Tobacco abuse - nicotine patch stopped on 4/15  Hypoalbuminemia/ severe protein calorie malnutrition - cont supplements   DVT prophylaxis: Lovenox Code Status: Full code Family Communication:  Disposition Plan: likely SNF   Consultants:   psych  Procedures:   Paracentesis on admission  Antimicrobials:  Anti-infectives    Start     Dose/Rate Route Frequency Ordered Stop   05/31/15 1000  fluconazole (DIFLUCAN) IVPB 200 mg     200 mg 100 mL/hr over 60 Minutes Intravenous Every 24 hours 05/31/15 0857     05/23/15 1400  rifaximin (XIFAXAN) tablet 550 mg     550 mg Oral 2 times daily 05/23/15 1251     05/23/15 1400  cefTRIAXone (ROCEPHIN) 2 g in dextrose 5 % 50 mL IVPB  Status:  Discontinued     2 g 100 mL/hr over 30 Minutes Intravenous Every 24 hours 05/23/15 1310 05/26/15 1509   05/21/15 1400  cefTRIAXone (ROCEPHIN) 1 g in dextrose 5 % 50 mL IVPB  Status:  Discontinued     1 g 100 mL/hr over 30 Minutes Intravenous Every 24 hours 05/21/15 1333 05/23/15 1310   05/18/15 2200  piperacillin-tazobactam (ZOSYN) IVPB 3.375 g  Status:  Discontinued     3.375 g 12.5 mL/hr over 240 Minutes Intravenous 3 times per day 05/18/15 1550 05/21/15 1333   05/18/15 1545  piperacillin-tazobactam (ZOSYN) IVPB 3.375 g  Status:  Discontinued     3.375 g 100 mL/hr over 30 Minutes Intravenous  Once 05/18/15 1531 05/18/15 1549   05/18/15 1115  cefTRIAXone (ROCEPHIN) 1  g in dextrose 5 % 50 mL IVPB  Status:  Discontinued     1 g 100 mL/hr over 30 Minutes Intravenous  Once 05/18/15 1109 05/18/15 1109   05/18/15 1115  piperacillin-tazobactam (ZOSYN) IVPB 3.375 g     3.375 g 12.5 mL/hr over 240 Minutes Intravenous  Once 05/18/15 1110 05/18/15 1310      Subjective: Feels good today. Would like to go home. I have gone over all her prior and current medication. Have discussed need for diuretics and fluid restriction due to ascites. She appears to understand. Refusing SNF. Her  mother is in the room and states she cannot take care of herself and she does not allow any family to come in the house. The patient is agreeable to Home health coming into the house but does not want her family to come in.   Objective: Filed Vitals:   06/01/15 1951 06/01/15 2142 06/02/15 0429 06/02/15 0500  BP:  110/62 117/65   Pulse:  83 91   Temp:  97.6 F (36.4 C) 97.6 F (36.4 C)   TempSrc:  Oral Oral   Resp:  18 18   Height:      Weight:    89.09 kg (196 lb 6.5 oz)  SpO2: 98% 99% 95%     Intake/Output Summary (Last 24 hours) at 06/02/15 1215 Last data filed at 06/02/15 0200  Gross per 24 hour  Intake    720 ml  Output    650 ml  Net     70 ml   Filed Weights   05/30/15 0900 06/01/15 0530 06/02/15 0500  Weight: 93.441 kg (206 lb) 87.091 kg (192 lb) 89.09 kg (196 lb 6.5 oz)    Examination: General exam: Appears calm and comfortable  Respiratory system: Clear to auscultation. Respiratory effort normal. Cardiovascular system: S1 & S2 heard, RRR. No JVD, murmurs, rubs, gallops or clicks. No pedal edema. Gastrointestinal system: Abdomen is distended, soft and nontender. No organomegaly or masses felt. Normal bowel sounds heard. Central nervous system: Alert and oriented. No focal neurological deficits. Extremities:no edema Skin: No rashes, lesions or ulcers Psychiatry: Judgement and insight appear normal. Mood & affect appropriate.     Data Reviewed: I have personally reviewed following labs and imaging studies  CBC:  Recent Labs Lab 05/28/15 0517 05/29/15 0538 05/30/15 1248 06/01/15 0549 06/02/15 0527  WBC 18.1* 17.2* 17.5* 13.8* 14.1*  HGB 12.7 12.2 13.0 12.5 12.2  HCT 37.0 35.8*  36.8 37.7 36.9 35.8*  MCV 106.9* 105.3* 106.8* 107.0* 107.5*  PLT 125* 130* 131* 130* 135*   Basic Metabolic Panel:  Recent Labs Lab 05/28/15 0517  05/29/15 0538 05/30/15 0521 05/31/15 0527 06/01/15 0549 06/02/15 0527  NA 129*  < > 133* 133* 133* 136 135  K 4.8  < >  3.3* 3.4* 3.2* 3.3* 3.9  CL 97*  < > 100* 100* 99* 102 103  CO2 22  < > GLUCOSE 74  < > 90 92 94 97 108*  BUN 7  < > 5* 6  CREATININE <0.30*  < > <0.30* 0.30* <0.30* 0.37* <0.30*  CALCIUM 7.9*  < > 8.2* 8.2* 8.5* 8.3* 8.4*  MG 1.9  --  1.8  --  1.8  --   --   < > = values in this interval not displayed. GFR: CrCl cannot be calculated (Patient has no serum creatinine result on file.). Liver Function Tests:  Recent Labs Lab 05/29/15  1610 05/30/15 0521 05/31/15 0527 06/01/15 0549 06/02/15 0527  AST 113* 119* 125* 117* 110*  ALT 56* 50 57* 55* 54  ALKPHOS 232* 234* 240* 215* 209*  BILITOT 13.0* 13.0* 14.5* 13.1* 12.1*  PROT 5.5* 5.5* 6.0* 5.8* 5.5*  ALBUMIN 1.8* 1.9* 2.0* 1.9* 1.9*   No results for input(s): LIPASE, AMYLASE in the last 168 hours.  Recent Labs Lab 05/31/15 0528  AMMONIA 49*   Coagulation Profile:  Recent Labs Lab 05/30/15 0521  INR 1.42   Cardiac Enzymes: No results for input(s): CKTOTAL, CKMB, CKMBINDEX, TROPONINI in the last 168 hours. BNP (last 3 results) No results for input(s): PROBNP in the last 8760 hours. HbA1C: No results for input(s): HGBA1C in the last 72 hours. CBG:  Recent Labs Lab 06/01/15 2140  GLUCAP 94   Lipid Profile: No results for input(s): CHOL, HDL, LDLCALC, TRIG, CHOLHDL, LDLDIRECT in the last 72 hours. Thyroid Function Tests: No results for input(s): TSH, T4TOTAL, FREET4, T3FREE, THYROIDAB in the last 72 hours. Anemia Panel: No results for input(s): VITAMINB12, FOLATE, FERRITIN, TIBC, IRON, RETICCTPCT in the last 72 hours. Urine analysis:    Component Value Date/Time   COLORURINE ORANGE* 05/28/2015 1419   APPEARANCEUR CLOUDY* 05/28/2015 1419   LABSPEC 1.013 05/28/2015 1419   PHURINE 6.0 05/28/2015 1419   GLUCOSEU NEGATIVE 05/28/2015 1419   HGBUR SMALL* 05/28/2015 1419   BILIRUBINUR LARGE* 05/28/2015 1419   KETONESUR NEGATIVE 05/28/2015 1419   PROTEINUR NEGATIVE 05/28/2015 1419    UROBILINOGEN 0.2 09/14/2011 2141   NITRITE NEGATIVE 05/28/2015 1419   LEUKOCYTESUR TRACE* 05/28/2015 1419   Sepsis Labs: @LABRCNTIP (procalcitonin:4,lacticidven:4)  )No results found for this or any previous visit (from the past 240 hour(s)).       Radiology Studies: No results found.      Scheduled Meds: . amantadine  100 mg Oral BID  . antiseptic oral rinse  7 mL Mouth Rinse BID  . enoxaparin (LOVENOX) injection  40 mg Subcutaneous Q24H  . feeding supplement (PRO-STAT SUGAR FREE 64)  30 mL Oral BID  . fluconazole (DIFLUCAN) IV  200 mg Intravenous Q24H  . FLUoxetine  20 mg Oral Daily  . furosemide  40 mg Intravenous Q8H  . mometasone-formoterol  2 puff Inhalation BID  . nystatin   Topical TID  . pantoprazole  40 mg Oral Daily  . rifaximin  550 mg Oral BID  . risperiDONE  1 mg Oral QHS  . spironolactone  100 mg Oral BID  . thiamine  100 mg Oral Daily  . traZODone  25 mg Oral QHS   Continuous Infusions:    LOS: 15 days    Time spent in minutes: 35    Shawntia Mangal, MD [facility] Pager: [email or web address] Password [facility] 06/01/2015, 12:15 PM

## 2015-06-02 NOTE — Progress Notes (Signed)
Physical Therapy Treatment Patient Details Name: Anne Black MRN: [id number] DOB: 1960/02/18 Today's Date: 06/02/2015    History of Present Illness 55 y.o. female with a history of multiple sclerosis, bipolar disorder, depression and anxiety, and ETOH dependence presented with diarrhea, nausea and vomiting and admitted for Hepatic cirrhosis/elevated LFTs/decompensated with ascites as well as hepatic encephalopathy, sepsis, UTI.     PT Comments    Pt tolerated increased gait distance today, she ambulated 40' with RW without loss of balance. Mobility improving overall. PT now recommending HHPT.   Follow Up Recommendations  Home health PT     Equipment Recommendations  None recommended by PT (pt stated she has a RW)    Recommendations for Other Services       Precautions / Restrictions Precautions Precautions: Fall Precaution Comments: pt admitted to Hx falls but  delined to discuss details of falls Restrictions Weight Bearing Restrictions: No    Mobility  Bed Mobility               General bed mobility comments: up in recliner  Transfers Overall transfer level: Needs assistance Equipment used: Rolling walker (2 wheeled) Transfers: Sit to/from Stand Sit to Stand: Supervision         General transfer comment: steady, no LOB, no physical assist  Ambulation/Gait Ambulation/Gait assistance: Supervision Ambulation Distance (Feet): 70 Feet Assistive device: Rolling walker (2 wheeled) Gait Pattern/deviations: WFL(Within Functional Limits)   Gait velocity interpretation: Below normal speed for age/gender General Gait Details: increased lateral trunk shift B, no LOB   Stairs            Wheelchair Mobility    Modified Rankin (Stroke Patients Only)       Balance     Sitting balance-Leahy Scale: Good Sitting balance - Comments: requires UE support     Standing balance-Leahy Scale: Fair                      Cognition Arousal/Alertness:  Awake/alert Behavior During Therapy: WFL for tasks assessed/performed Overall Cognitive Status: Within Functional Limits for tasks assessed                      Exercises      General Comments        Pertinent Vitals/Pain Pain Score: 3  Pain Location: buttocks Pain Descriptors / Indicators: Sore Pain Intervention(s): Monitored during session;Limited activity within patient's tolerance;Premedicated before session    Home Living                      Prior Function            PT Goals (current goals can now be found in the care plan section) Acute Rehab PT Goals PT Goal Formulation: With patient Time For Goal Achievement: 06/09/15 Potential to Achieve Goals: Good Progress towards PT goals: Progressing toward goals    Frequency  Min 3X/week    PT Plan Current plan remains appropriate    Co-evaluation             End of Session Equipment Utilized During Treatment: Gait belt Activity Tolerance: Patient tolerated treatment well Patient left: with call bell/phone within reach;in chair     Time: 1207-1223 PT Time Calculation (min) (ACUTE ONLY): 16 min  Charges:  $Gait Training: 8-22 mins                    G Codes:  Ralene Bathe Kistler 06/02/2015, 12:30 PM [phone]

## 2015-06-02 NOTE — Progress Notes (Signed)
LCSW has been following patient for disposition. Capacity completed by Psych:  Patient has capacity to make decisions.  Patient was faxed out with only one bed offer: [facility]. [facility] made bed offer and authorization for insurance was obtained. Spoke with facility today and they are prepared to accept patient.  Patient however has not been agreeable.   Plan: SNF bed and authorization completed and obtained.  Bed available if patient agreeable. If patient declines:  Return home and she has been open to [facility].  Deretha Emory, MSW Clinical Social Work: [employer] [phone]

## 2015-06-03 LAB — COMPREHENSIVE METABOLIC PANEL
ALBUMIN: 1.9 g/dL — AB (ref 3.5–5.0)
ALT: 48 U/L (ref 14–54)
ANION GAP: 11 (ref 5–15)
AST: 102 U/L — ABNORMAL HIGH (ref 15–41)
Alkaline Phosphatase: 181 U/L — ABNORMAL HIGH (ref 38–126)
BILIRUBIN TOTAL: 10.5 mg/dL — AB (ref 0.3–1.2)
BUN: 10 mg/dL (ref 6–20)
CALCIUM: 8.2 mg/dL — AB (ref 8.9–10.3)
CO2: 24 mmol/L (ref 22–32)
CREATININE: 0.47 mg/dL (ref 0.44–1.00)
Chloride: 99 mmol/L — ABNORMAL LOW (ref 101–111)
GFR calc non Af Amer: >60 mL/min (ref >60)
GLUCOSE: 98 mg/dL (ref 65–99)
Potassium: 3.8 mmol/L (ref 3.5–5.1)
Sodium: 134 mmol/L — ABNORMAL LOW (ref 135–145)
TOTAL PROTEIN: 5.5 g/dL — AB (ref 6.5–8.1)

## 2015-06-03 LAB — CBC
HEMATOCRIT: 34.5 % — AB (ref 36.0–46.0)
HEMOGLOBIN: 11.7 g/dL — AB (ref 12.0–15.0)
MCH: 36.1 pg — AB (ref 26.0–34.0)
MCHC: 33.9 g/dL (ref 30.0–36.0)
MCV: 106.5 fL — ABNORMAL HIGH (ref 78.0–100.0)
Platelets: 123 10*3/uL — ABNORMAL LOW (ref 150–400)
RBC: 3.24 MIL/uL — AB (ref 3.87–5.11)
RDW: 19.3 % — ABNORMAL HIGH (ref 11.5–15.5)
WBC: 11.8 10*3/uL — ABNORMAL HIGH (ref 4.0–10.5)

## 2015-06-03 MED ORDER — FUROSEMIDE 10 MG/ML IJ SOLN
40.0000 mg | Freq: Two times a day (BID) | INTRAMUSCULAR | Status: DC
  Administered 2015-06-03 – 2015-06-04 (×2): 40 mg via INTRAVENOUS
  Filled 2015-06-03 (×2): qty 4

## 2015-06-03 MED ORDER — FLUCONAZOLE 100 MG PO TABS
100.0000 mg | ORAL_TABLET | Freq: Every day | ORAL | Status: DC
  Administered 2015-06-04 – 2015-06-05 (×2): 100 mg via ORAL
  Filled 2015-06-03 (×2): qty 1

## 2015-06-03 NOTE — Progress Notes (Signed)
PROGRESS NOTE    Anne Black  ZOX:[id number] DOB: 09-Aug-1960 DOA: 05/18/2015  PCP: Lucilla Edin, MD   Brief Narrative:  With a history of multiple sclerosis, bipolar disorder, depression and anxiety, presents emergency department with complaints of abdominal pain, nausea and vomiting, diarrhea. She was tachycardic, tachypneic, WBC count 13, lactic acid 5.4. Source suspected to be SBP vs UTI. U cx grew Klebsiella and E coli which was treated for 9 days of appropriate antibiotics. WBC count never improved but other signs of sepsis resolved.   4/25: Refusing SNF. Her mother is in the room and states she cannot take care of herself and she does not allow any family to come in the house. The patient is agreeable to Home health coming into the house but does not want her family to come in.   Assessment & Plan:   Principal Problem:   Sepsis due to UTI vs SBP - treated for Klebsiella and E coli UTI with Zosyn and Rocephin for 9 days - paracentesis done in ER but no cell count sent - 9 days of treatment should be sufficient - abdomen non tender - has severe diffuse candida dermatitis with pustules on buttocks which may be why her WBC count was still 17- has been getting nystatin ointment-  added Diflucan on 4/22- WBC now 11.8 & candida dermatitis improving  Active Problems: Encephalopathy, hepatic  - Xifaxin- she is refusing Lactulose- seems stable just on Xifaxin-  d/c'd Lactulose  Alcoholic cirrhosis with ascites and mild thrombocytopenia - cont Lasix and Aldactone-- talked to her about limiting IVF NOTE: she was not on diuretics prior to coming to the hospital- she wants a paracentesis but as diuretics are working, will hold off on this to prevent infection - I and O inaccurate- follow daily weights - jaundice with severely elevated Bilirubin (13-14) - Maddrey's discriminant function is 22  ETOH withdrawal - resolved  Hypokalemia - due to lasix and poor PO intake - resolved-    Anxiety/depression/bipolar disorder - Continue Cogentin, Risperdal, Prozac Psych consulted for capacity eval, patient has intermittently refusing meds/testing/refusing snf placement- per nursing she is much better this week as compared to last week- psych noted on 4/22 that she has capacity to make medical decisions but need to follow with outpt psych  Multiple sclerosis/ADHD -  Provigil on hold due to elevated LFTs   Tobacco abuse - nicotine patch stopped on 4/15  Hypoalbuminemia/ severe protein calorie malnutrition - cont supplements  NOTE: a number of medications have been discontinued during this hospital stay. She will go home with [facility] to follow medication intake. She will f/u with [place] [facility].   DVT prophylaxis: Lovenox Code Status: Full code Family Communication:  Disposition Plan: likely SNF   Consultants:   psych  Procedures:   Paracentesis on admission  Antimicrobials:  Anti-infectives    Start     Dose/Rate Route Frequency Ordered Stop   05/31/15 1000  fluconazole (DIFLUCAN) IVPB 200 mg     200 mg 100 mL/hr over 60 Minutes Intravenous Every 24 hours 05/31/15 0857     05/23/15 1400  rifaximin (XIFAXAN) tablet 550 mg     550 mg Oral 2 times daily 05/23/15 1251     05/23/15 1400  cefTRIAXone (ROCEPHIN) 2 g in dextrose 5 % 50 mL IVPB  Status:  Discontinued     2 g 100 mL/hr over 30 Minutes Intravenous Every 24 hours 05/23/15 1310 05/26/15 1509   05/21/15 1400  cefTRIAXone (ROCEPHIN) 1  g in dextrose 5 % 50 mL IVPB  Status:  Discontinued     1 g 100 mL/hr over 30 Minutes Intravenous Every 24 hours 05/21/15 1333 05/23/15 1310   05/18/15 2200  piperacillin-tazobactam (ZOSYN) IVPB 3.375 g  Status:  Discontinued     3.375 g 12.5 mL/hr over 240 Minutes Intravenous 3 times per day 05/18/15 1550 05/21/15 1333   05/18/15 1545  piperacillin-tazobactam (ZOSYN) IVPB 3.375 g  Status:  Discontinued     3.375 g 100 mL/hr over 30 Minutes Intravenous  Once 05/18/15  1531 05/18/15 1549   05/18/15 1115  cefTRIAXone (ROCEPHIN) 1 g in dextrose 5 % 50 mL IVPB  Status:  Discontinued     1 g 100 mL/hr over 30 Minutes Intravenous  Once 05/18/15 1109 05/18/15 1109   05/18/15 1115  piperacillin-tazobactam (ZOSYN) IVPB 3.375 g     3.375 g 12.5 mL/hr over 240 Minutes Intravenous  Once 05/18/15 1110 05/18/15 1310      Subjective: No complaints - again wants to go home - agrees to stay until ascites improves.   Objective: Filed Vitals:   06/02/15 2112 06/02/15 2337 06/03/15 0511 06/03/15 0831  BP: 99/49 102/73 101/62   Pulse: 85  88   Temp: 97.9 F (36.6 C)  98.7 F (37.1 C)   TempSrc: Oral  Oral   Resp: 18  18   Height:      Weight:      SpO2: 97%  96% 95%    Intake/Output Summary (Last 24 hours) at 06/03/15 1344 Last data filed at 06/03/15 0825  Gross per 24 hour  Intake    936 ml  Output    100 ml  Net    836 ml   Filed Weights   05/30/15 0900 06/01/15 0530 06/02/15 0500  Weight: 93.441 kg (206 lb) 87.091 kg (192 lb) 89.09 kg (196 lb 6.5 oz)    Examination: General exam: Appears calm and comfortable  Respiratory system: Clear to auscultation. Respiratory effort normal. Cardiovascular system: S1 & S2 heard, RRR. No JVD, murmurs, rubs, gallops or clicks. No pedal edema. Gastrointestinal system: Abdomen is distended, soft and nontender. No organomegaly or masses felt. Normal bowel sounds heard. Central nervous system: Alert and oriented. No focal neurological deficits. Extremities:no edema Skin: No rashes, lesions or ulcers Psychiatry: Judgement and insight appear normal. Mood & affect appropriate.     Data Reviewed: I have personally reviewed following labs and imaging studies  CBC:  Recent Labs Lab 05/30/15 1248 06/01/15 0549 06/02/15 0527 06/02/15 1329 06/03/15 0539  WBC 17.5* 13.8* 14.1* 13.1* 11.8*  HGB 13.0 12.5 12.2 12.3 11.7*  HCT 37.7 36.9 35.8* 36.1 34.5*  MCV 106.8* 107.0* 107.5* 108.7* 106.5*  PLT 131* 130* 135*  138* 123*   Basic Metabolic Panel:  Recent Labs Lab 05/28/15 0517  05/29/15 0538 05/30/15 0521 05/31/15 0527 06/01/15 0549 06/02/15 0527 06/03/15 0539  NA 129*  < > 133* 133* 133* 136 135 134*  K 4.8  < > 3.3* 3.4* 3.2* 3.3* 3.9 3.8  CL 97*  < > 100* 100* 99* 102 103 99*  CO2 22  < > GLUCOSE 74  < > 90 92 94 97 108* 98  BUN 7  < > 5* 6 10  CREATININE <0.30*  < > <0.30* 0.30* <0.30* 0.37* <0.30* 0.47  CALCIUM 7.9*  < > 8.2* 8.2* 8.5* 8.3* 8.4* 8.2*  MG 1.9  --  1.8  --  1.8  --   --   --   < > = values in this interval not displayed. GFR: Estimated Creatinine Clearance: 82.4 mL/min (by C-G formula based on Cr of 0.47). Liver Function Tests:  Recent Labs Lab 05/30/15 0521 05/31/15 0527 06/01/15 0549 06/02/15 0527 06/03/15 0539  AST 119* 125* 117* 110* 102*  ALT 50 57* 55* 54 48  ALKPHOS 234* 240* 215* 209* 181*  BILITOT 13.0* 14.5* 13.1* 12.1* 10.5*  PROT 5.5* 6.0* 5.8* 5.5* 5.5*  ALBUMIN 1.9* 2.0* 1.9* 1.9* 1.9*   No results for input(s): LIPASE, AMYLASE in the last 168 hours.  Recent Labs Lab 05/31/15 0528  AMMONIA 49*   Coagulation Profile:  Recent Labs Lab 05/30/15 0521  INR 1.42   Cardiac Enzymes: No results for input(s): CKTOTAL, CKMB, CKMBINDEX, TROPONINI in the last 168 hours. BNP (last 3 results) No results for input(s): PROBNP in the last 8760 hours. HbA1C: No results for input(s): HGBA1C in the last 72 hours. CBG:  Recent Labs Lab 06/01/15 2140  GLUCAP 94   Lipid Profile: No results for input(s): CHOL, HDL, LDLCALC, TRIG, CHOLHDL, LDLDIRECT in the last 72 hours. Thyroid Function Tests: No results for input(s): TSH, T4TOTAL, FREET4, T3FREE, THYROIDAB in the last 72 hours. Anemia Panel: No results for input(s): VITAMINB12, FOLATE, FERRITIN, TIBC, IRON, RETICCTPCT in the last 72 hours. Urine analysis:    Component Value Date/Time   COLORURINE ORANGE* 05/28/2015 1419   APPEARANCEUR CLOUDY* 05/28/2015 1419    LABSPEC 1.013 05/28/2015 1419   PHURINE 6.0 05/28/2015 1419   GLUCOSEU NEGATIVE 05/28/2015 1419   HGBUR SMALL* 05/28/2015 1419   BILIRUBINUR LARGE* 05/28/2015 1419   KETONESUR NEGATIVE 05/28/2015 1419   PROTEINUR NEGATIVE 05/28/2015 1419   UROBILINOGEN 0.2 09/14/2011 2141   NITRITE NEGATIVE 05/28/2015 1419   LEUKOCYTESUR TRACE* 05/28/2015 1419   Sepsis Labs: @LABRCNTIP (procalcitonin:4,lacticidven:4)  )No results found for this or any previous visit (from the past 240 hour(s)).       Radiology Studies: No results found.      Scheduled Meds: . amantadine  100 mg Oral BID  . antiseptic oral rinse  7 mL Mouth Rinse BID  . enoxaparin (LOVENOX) injection  40 mg Subcutaneous Q24H  . feeding supplement (PRO-STAT SUGAR FREE 64)  30 mL Oral BID  . fluconazole (DIFLUCAN) IV  200 mg Intravenous Q24H  . FLUoxetine  20 mg Oral Daily  . furosemide  40 mg Intravenous Q8H  . mometasone-formoterol  2 puff Inhalation BID  . nystatin   Topical TID  . pantoprazole  40 mg Oral Daily  . rifaximin  550 mg Oral BID  . risperiDONE  1 mg Oral QHS  . spironolactone  100 mg Oral BID  . thiamine  100 mg Oral Daily  . traZODone  25 mg Oral QHS   Continuous Infusions:    LOS: 16 days    Time spent in minutes: 35    Niyonna Betsill, MD [facility] Pager: [email or web address] Password [email or web address] 06/01/2015, 1:44 PM

## 2015-06-03 NOTE — Progress Notes (Signed)
Evening dose IV lasix held due to low BP. MD asked to recheck after two hours and hold if SBP under 105.

## 2015-06-03 NOTE — Progress Notes (Signed)
Attempted to schedule follow up appt with [facility] in one week of pt discharge.  Unable to schedule appointment, office does not make appointments, instructed for patient to walk in to be seen after discharge.  Appointment information shared with patient and patient verbalized understanding.

## 2015-06-03 NOTE — Progress Notes (Signed)
Physical Therapy Treatment Patient Details Name: Anne Black MRN: [id number] DOB: Nov 27, 1960 Today's Date: 06/03/2015    History of Present Illness 55 y.o. female with a history of multiple sclerosis, bipolar disorder, depression and anxiety, and ETOH dependence presented with diarrhea, nausea and vomiting and admitted for Hepatic cirrhosis/elevated LFTs/decompensated with ascites as well as hepatic encephalopathy, sepsis, UTI.     PT Comments    Pt "Not Happy" because she thought she was going home today.  Assisted with amb a greater distance then returned to room to use bathroom.  Then assisted to recliner.  Pt also amb in room without the use of the walker tolerated well, no LOB.  Occasional use of hands to steady self on furniture.   Follow Up Recommendations  Home health PT     Equipment Recommendations  None recommended by PT    Recommendations for Other Services       Precautions / Restrictions Precautions Precautions: Fall Precaution Comments: pt admitted to Hx falls but  delined to discuss details of falls Restrictions Weight Bearing Restrictions: No    Mobility  Bed Mobility               General bed mobility comments: up in recliner  Transfers Overall transfer level: Needs assistance Equipment used: Rolling walker (2 wheeled) Transfers: Sit to/from Stand Sit to Stand: Min guard;Supervision         General transfer comment: 50% VC's for safety esp with turn completion prior to sit  Ambulation/Gait Ambulation/Gait assistance: Min guard;Supervision Ambulation Distance (Feet): 115 Feet Assistive device: Rolling walker (2 wheeled) Gait Pattern/deviations: Step-through pattern;Decreased stride length Gait velocity: decreased   General Gait Details: tolerated increased distance and amb in room without AD with no LOB.   Stairs            Wheelchair Mobility    Modified Rankin (Stroke Patients Only)       Balance                                     Cognition Arousal/Alertness: Awake/alert                          Exercises      General Comments        Pertinent Vitals/Pain Pain Assessment: No/denies pain    Home Living                      Prior Function            PT Goals (current goals can now be found in the care plan section) Progress towards PT goals: Progressing toward goals    Frequency  Min 3X/week    PT Plan Current plan remains appropriate    Co-evaluation             End of Session Equipment Utilized During Treatment: Gait belt Activity Tolerance: Patient tolerated treatment well Patient left: in chair;with call bell/phone within reach;with chair alarm set     Time: 1440-1505 PT Time Calculation (min) (ACUTE ONLY): 25 min  Charges:  $Gait Training: 8-22 mins $Therapeutic Activity: 8-22 mins                    G Codes:      Felecia Shelling  PTA [facility]  Acute  Rehab Pager      [phone]

## 2015-06-03 NOTE — Progress Notes (Signed)
Patient has refused SNF, PT re-evaluated, and said patient could go home with home health. LCSW signing off at this time. If needs arise, please re-consult.  Deretha Emory, MSW Clinical Social Work: [facility] [employer] [phone]

## 2015-06-04 ENCOUNTER — Inpatient Hospital Stay (HOSPITAL_COMMUNITY): Payer: Managed Care, Other (non HMO)

## 2015-06-04 DIAGNOSIS — K7290 Hepatic failure, unspecified without coma: Secondary | ICD-10-CM

## 2015-06-04 DIAGNOSIS — A419 Sepsis, unspecified organism: Principal | ICD-10-CM

## 2015-06-04 DIAGNOSIS — E876 Hypokalemia: Secondary | ICD-10-CM

## 2015-06-04 DIAGNOSIS — F1910 Other psychoactive substance abuse, uncomplicated: Secondary | ICD-10-CM

## 2015-06-04 DIAGNOSIS — G35 Multiple sclerosis: Secondary | ICD-10-CM

## 2015-06-04 DIAGNOSIS — K7031 Alcoholic cirrhosis of liver with ascites: Secondary | ICD-10-CM

## 2015-06-04 LAB — CBC
HEMATOCRIT: 35.5 % — AB (ref 36.0–46.0)
HEMOGLOBIN: 12.1 g/dL (ref 12.0–15.0)
MCH: 37 pg — AB (ref 26.0–34.0)
MCHC: 34.1 g/dL (ref 30.0–36.0)
MCV: 108.6 fL — ABNORMAL HIGH (ref 78.0–100.0)
Platelets: 147 10*3/uL — ABNORMAL LOW (ref 150–400)
RBC: 3.27 MIL/uL — ABNORMAL LOW (ref 3.87–5.11)
RDW: 19.4 % — ABNORMAL HIGH (ref 11.5–15.5)
WBC: 11.7 10*3/uL — ABNORMAL HIGH (ref 4.0–10.5)

## 2015-06-04 LAB — COMPREHENSIVE METABOLIC PANEL
ALK PHOS: 180 U/L — AB (ref 38–126)
ALT: 48 U/L (ref 14–54)
ANION GAP: 7 (ref 5–15)
AST: 119 U/L — ABNORMAL HIGH (ref 15–41)
Albumin: 2 g/dL — ABNORMAL LOW (ref 3.5–5.0)
BILIRUBIN TOTAL: 8.9 mg/dL — AB (ref 0.3–1.2)
BUN: 8 mg/dL (ref 6–20)
CALCIUM: 8.3 mg/dL — AB (ref 8.9–10.3)
CO2: 27 mmol/L (ref 22–32)
Chloride: 100 mmol/L — ABNORMAL LOW (ref 101–111)
Creatinine, Ser: <0.3 mg/dL — ABNORMAL LOW (ref 0.44–1.00)
Glucose, Bld: 107 mg/dL — ABNORMAL HIGH (ref 65–99)
Potassium: 4 mmol/L (ref 3.5–5.1)
Sodium: 134 mmol/L — ABNORMAL LOW (ref 135–145)
TOTAL PROTEIN: 5.3 g/dL — AB (ref 6.5–8.1)

## 2015-06-04 MED ORDER — FUROSEMIDE 80 MG PO TABS
80.0000 mg | ORAL_TABLET | Freq: Two times a day (BID) | ORAL | Status: DC
  Administered 2015-06-04 – 2015-06-05 (×2): 80 mg via ORAL
  Filled 2015-06-04 (×5): qty 1

## 2015-06-04 NOTE — Progress Notes (Signed)
PROGRESS NOTE  Anne Black:[id number] DOB: [birth year] DOA: 05/18/2015 PCP: Lucilla Edin, MD Outpatient Specialists:  Dr. Stephanie Acre, neurology  Brief History:  55 y/o female with hx of history of multiple sclerosis, bipolar disorder, depression and anxiety, presents emergency department with complaints of abdominal pain, nausea and vomiting, diarrhea. She was tachycardic, tachypneic, WBC count 13, lactic acid 5.4. Source suspected to be SBP vs UTI. U cx grew Klebsiella and E coli which was treated for 9 days of appropriate antibiotics. WBC count never improved but other signs of sepsis resolved.   4/25: Refusing SNF. Her mother is in the room and states she cannot take care of herself and she does not allow any family to come in the house. The patient is agreeable to Home health coming into the house but does not want her family to come in.   Assessment/Plan:  Sepsis due to UTI vs SBP - treated for Klebsiella and E coli UTI with Zosyn and Rocephin for 9 days - paracentesis done in ER but no cell count sent - 9 days of treatment  - has severe diffuse candida dermatitis with pustules on buttocks which may be why her WBC count was still 17- has been getting nystatin ointment- added Diflucan on 4/22 -WBC improving -continue fluconazole  Active Problems: Encephalopathy, hepatic  - Xifaxin- she is refusing Lactulose- seems stable just on Xifaxin- d/c'd Lactulose -4/26--continues to refuse lactulose after discussion of risks, benefits, alternatives--pt remains A&O x 3  Alcoholic cirrhosis with ascites and mild thrombocytopenia - cont Lasix and Aldactone-- talked to her about limiting IVF -was not on diuretics prior to coming to the hospital- she wants a paracentesis but as diuretics are working, will hold off on this to prevent infection - I and O inaccurate- follow daily weights - jaundice with severely elevated Bilirubin (13-14) - noncompliant with fluid  restriction -Change furosemide to po -Continue Aldactone -4/26--request repeat paracentesis by IR  ETOH withdrawal - resolved  Hypokalemia - due to lasix and poor PO intake - resolved-   Anxiety/depression/bipolar disorder - Continue Cogentin, Risperdal, Prozac Psych consulted for capacity eval, patient has intermittently refusing meds/testing/refusing snf placement- per nursing she is much better this week as compared to last week- psych noted on 4/22 that she has capacity to make medical decisions but need to follow with outpt psych  Multiple sclerosis/ADHD - Provigil on hold due to elevated LFTs   Tobacco abuse - nicotine patch stopped on 4/15  Hypoalbuminemia/ severe protein calorie malnutrition - cont supplements  Migraine headaches -Topamax discontinued without recurrence of headache   DVT prophylaxis: Lovenox Code Status: Full code Family Communication:  Disposition Plan: home on 06/05/15 if stable   Consultants:   psych  Procedures:   Paracentesis on admission        Subjective: Patient complains of intermittent abdominal pain. Denies any vomiting, diarrhea, chest pain, shortness breath, headache, neck pain. Denies any fevers or chills.  Objective: Filed Vitals:   06/03/15 2105 06/04/15 0500 06/04/15 0530 06/04/15 0713  BP: 98/54  112/55 109/68  Pulse: 87  89 91  Temp: 97.7 F (36.5 C)  97.6 F (36.4 C) 97.5 F (36.4 C)  TempSrc: Oral  Oral Oral  Resp: 20  18   Height:      Weight:  87.998 kg (194 lb)    SpO2: 93%  95% 94%    Intake/Output Summary (Last 24 hours) at 06/04/15 1038 Last data  filed at 06/04/15 0918  Gross per 24 hour  Intake    950 ml  Output   1030 ml  Net    -80 ml   Weight change:  Exam:   General:  Pt is alert, follows commands appropriately, not in acute distress  HEENT: No icterus, No thrush, No neck mass, [place]/AT  Cardiovascular: RRR, S1/S2, no rubs, no gallops  Respiratory: Diminished breath sounds but  clear to auscultation. No wheezing  Abdomen: Soft/+BS, non tender, mildly distended, no guarding  Extremities: 1+LE edema, No lymphangitis, No petechiae, No rashes, no synovitis   Data Reviewed: I have personally reviewed following labs and imaging studies Basic Metabolic Panel:  Recent Labs Lab 05/29/15 0538  05/31/15 0527 06/01/15 0549 06/02/15 0527 06/03/15 0539 06/04/15 0537  NA 133*  < > 133* 136 135 134* 134*  K 3.3*  < > 3.2* 3.3* 3.9 3.8 4.0  CL 100*  < > 99* 102 103 99* 100*  CO2 23  < > 23 24 23 24 27   GLUCOSE 90  < > 94 97 108* 98 107*  BUN 7  < > 6 5* 6 10 8   CREATININE <0.30*  < > <0.30* 0.37* <0.30* 0.47 <0.30*  CALCIUM 8.2*  < > 8.5* 8.3* 8.4* 8.2* 8.3*  MG 1.8  --  1.8  --   --   --   --   < > = values in this interval not displayed. Liver Function Tests:  Recent Labs Lab 05/31/15 0527 06/01/15 0549 06/02/15 0527 06/03/15 0539 06/04/15 0537  AST 125* 117* 110* 102* 119*  ALT 57* 55* 54 48 48  ALKPHOS 240* 215* 209* 181* 180*  BILITOT 14.5* 13.1* 12.1* 10.5* 8.9*  PROT 6.0* 5.8* 5.5* 5.5* 5.3*  ALBUMIN 2.0* 1.9* 1.9* 1.9* 2.0*   No results for input(s): LIPASE, AMYLASE in the last 168 hours.  Recent Labs Lab 05/31/15 0528  AMMONIA 49*   Coagulation Profile:  Recent Labs Lab 05/30/15 0521  INR 1.42   CBC:  Recent Labs Lab 06/01/15 0549 06/02/15 0527 06/02/15 1329 06/03/15 0539 06/04/15 0537  WBC 13.8* 14.1* 13.1* 11.8* 11.7*  HGB 12.5 12.2 12.3 11.7* 12.1  HCT 36.9 35.8* 36.1 34.5* 35.5*  MCV 107.0* 107.5* 108.7* 106.5* 108.6*  PLT 130* 135* 138* 123* 147*   Cardiac Enzymes: No results for input(s): CKTOTAL, CKMB, CKMBINDEX, TROPONINI in the last 168 hours. BNP: Invalid input(s): POCBNP CBG:  Recent Labs Lab 06/01/15 2140  GLUCAP 94   HbA1C: No results for input(s): HGBA1C in the last 72 hours. Urine analysis:    Component Value Date/Time   COLORURINE ORANGE* 05/28/2015 1419   APPEARANCEUR CLOUDY* 05/28/2015 1419    LABSPEC 1.013 05/28/2015 1419   PHURINE 6.0 05/28/2015 1419   GLUCOSEU NEGATIVE 05/28/2015 1419   HGBUR SMALL* 05/28/2015 1419   BILIRUBINUR LARGE* 05/28/2015 1419   KETONESUR NEGATIVE 05/28/2015 1419   PROTEINUR NEGATIVE 05/28/2015 1419   UROBILINOGEN 0.2 09/14/2011 2141   NITRITE NEGATIVE 05/28/2015 1419   LEUKOCYTESUR TRACE* 05/28/2015 1419   Sepsis Labs: @LABRCNTIP (procalcitonin:4,lacticidven:4) )No results found for this or any previous visit (from the past 240 hour(s)).   Scheduled Meds: . amantadine  100 mg Oral BID  . antiseptic oral rinse  7 mL Mouth Rinse BID  . enoxaparin (LOVENOX) injection  40 mg Subcutaneous Q24H  . feeding supplement (PRO-STAT SUGAR FREE 64)  30 mL Oral BID  . fluconazole  100 mg Oral Daily  . FLUoxetine  20 mg Oral  Daily  . furosemide  40 mg Intravenous Q12H  . mometasone-formoterol  2 puff Inhalation BID  . nystatin   Topical TID  . pantoprazole  40 mg Oral Daily  . rifaximin  550 mg Oral BID  . risperiDONE  1 mg Oral QHS  . spironolactone  100 mg Oral BID  . thiamine  100 mg Oral Daily  . traZODone  25 mg Oral QHS   Continuous Infusions:   Procedures/Studies: Dg Chest 1 View  05/28/2015  CLINICAL DATA:  Ongoing cough which has worsened, some shortness of breath, multiple sclerosis, smoker EXAM: CHEST 1 VIEW COMPARISON:  Portable exam 1405 hours compared to 05/18/2015 FINDINGS: Enlargement of cardiac silhouette. Mediastinal contours and pulmonary vascularity normal. Lungs clear. No pleural effusion or pneumothorax. Bones demineralized. IMPRESSION: Minimal enlargement of cardiac silhouette without acute infiltrate. Electronically Signed   By: Ulyses Southward M.D.   On: 05/28/2015 14:37   Dg Shoulder Right  05/18/2015  CLINICAL DATA:  Pt c/o diffuse right shoulder pain s/p alleged fall this week, pt not able, or willing, to communicate more hx. Pt states she hurts all over. Epic chart states pt hx MS, symptoms worsening this week. Pt also c/o n/v  EXAM: RIGHT SHOULDER - 2+ VIEW COMPARISON:  None. FINDINGS: No fracture. No dislocation. No significant arthropathic changes. No bone lesion. Soft tissues are unremarkable. IMPRESSION: No fracture or dislocation. Electronically Signed   By: Amie Portland M.D.   On: 05/18/2015 09:52   US Abdomen Complete  05/13/2015  CLINICAL DATA:  Abdominal pain. EXAM: ABDOMEN ULTRASOUND COMPLETE COMPARISON:  No prior. FINDINGS: Gallbladder: No gallstones or wall thickening visualized. No sonographic Murphy sign noted by sonographer. Common bile duct: Diameter: 4.1 mm Liver: Liver is echogenic consistent fatty infiltration and/or hepatocellular disease. No focal hepatic abnormality identified . IVC: No abnormality visualized. Pancreas: Visualized portion unremarkable. Spleen: Size and appearance within normal limits. Right Kidney: Length: 10.8 cm. Echogenicity within normal limits. No mass or hydronephrosis visualized. Left Kidney: Length: 10.2 cm. Echogenicity within normal limits. No mass or hydronephrosis visualized. Abdominal aorta: No aneurysm visualized. Other findings: Mild ascites. IMPRESSION: 1. Liver is echogenic consistent fatty infiltration and/or hepatocellular disease. No focal hepatic abnormality identified. 2.  Mild ascites. Electronically Signed   By: Maisie Fus  Register   On: 05/13/2015 07:30   Ct Abdomen Pelvis W Contrast  05/18/2015  CLINICAL DATA:  55 year old presenting with 1 day history of generalized abdominal pain, abdominal distention nausea and vomiting, and diarrhea. Patient has hyperbilirubinemia on laboratory evaluation. EXAM: CT ABDOMEN AND PELVIS WITH CONTRAST TECHNIQUE: Multidetector CT imaging of the abdomen and pelvis was performed using the standard protocol following bolus administration of intravenous contrast. CONTRAST:  ISOVUE-300 IOPAMIDOL INJECTION 61% IV. Oral contrast was also administered. COMPARISON:  Abdominal ultrasound performed earlier today. No prior CT. FINDINGS: Lower  chest: Linear atelectasis or scarring in the lingula. Visualized lung bases otherwise clear. Heart size normal. Hepatobiliary: Severe diffuse hepatic steatosis. Slight irregularity of the liver contour. Relative enlargement of the left lobe and caudate lobe. No visible hepatic masses. Gallbladder unremarkable by CT (apparent high attenuation bile is artifactual and related to the fact that the normal bile is of higher attenuation than the severe fatty liver). No biliary ductal dilation. Pancreas: Normal in appearance without evidence of mass, ductal dilation, or inflammation. Spleen: Normal in size and appearance. Adrenals/Urinary Tract: Normal appearing adrenal glands. Kidneys normal in size and appearance without focal parenchymal abnormality. No evidence of urinary tract calculi or obstruction.  Normal-appearing decompressed urinary bladder. Stomach/Bowel: Stomach normal in appearance for the degree of distention. Oral contrast material within the distal esophagus which demonstrates mild circumferential wall thickening. No hiatal hernia. Wall thickening involving multiple loops of small bowel, particularly the contrast filled jejunum. Wall thickening involving the decompressed colon to the level of the sigmoid. Appendix not clearly demonstrated. Vascular/Lymphatic: Mild aortoiliac atherosclerosis without aneurysm. Normal-appearing portal venous and systemic venous systems. No pathologic lymphadenopathy. Reproductive: Normal-appearing uterus. Endometrial thickening and/or fluid likely related to the menstrual phase. No adnexal masses. Other: Moderate amount of ascites throughout the abdomen and pelvis. Diffuse body wall edema. Musculoskeletal: No acute abnormalities. IMPRESSION: 1. Hepatic cirrhosis. Severe diffuse hepatic steatosis. No hepatic parenchymal masses. 2. Moderate amount of ascites throughout the abdomen and pelvis. 3. Wall thickening involving the small bowel and colon. I note that the patient has  hypoproteinemia and hypoalbuminemia, and this may account for the diffuse bowel wall thickening. 4. Anasarca. Electronically Signed   By: Hulan Saas M.D.   On: 05/18/2015 12:10   Dg Chest Port 1 View  05/18/2015  CLINICAL DATA:  Patient with sepsis.  Cough. EXAM: PORTABLE CHEST 1 VIEW COMPARISON:  Chest radiograph 04/06/2015. FINDINGS: Multiple monitoring leads overlie the patient. Stable cardiac and mediastinal contours. No consolidative pulmonary opacity. No pleural effusion or pneumothorax. IMPRESSION: No acute cardiopulmonary process. Electronically Signed   By: Annia Belt M.D.   On: 05/18/2015 15:54    Nakeisha Greenhouse, DO  [facility] Pager [phone]  If 7PM-7AM, please contact night-coverage [email or web address] Password [email or web address] 06/04/2015, 10:38 AM   LOS: 17 days

## 2015-06-04 NOTE — Progress Notes (Signed)
Pt's mother is gravely concerned the she is not well enough to go home. "She needs to go to a facility." Mother states that "if Jyz[facility] will not let me have access to her medical information and won't let me help her then I am not going to pick her up."

## 2015-06-05 ENCOUNTER — Telehealth: Payer: Self-pay | Admitting: Family Medicine

## 2015-06-05 ENCOUNTER — Other Ambulatory Visit (HOSPITAL_COMMUNITY): Payer: Self-pay

## 2015-06-05 DIAGNOSIS — N390 Urinary tract infection, site not specified: Secondary | ICD-10-CM

## 2015-06-05 DIAGNOSIS — F10230 Alcohol dependence with withdrawal, uncomplicated: Secondary | ICD-10-CM

## 2015-06-05 DIAGNOSIS — K7030 Alcoholic cirrhosis of liver without ascites: Secondary | ICD-10-CM

## 2015-06-05 DIAGNOSIS — E43 Unspecified severe protein-calorie malnutrition: Secondary | ICD-10-CM

## 2015-06-05 LAB — COMPREHENSIVE METABOLIC PANEL
ALBUMIN: 2 g/dL — AB (ref 3.5–5.0)
ALK PHOS: 169 U/L — AB (ref 38–126)
ALT: 47 U/L (ref 14–54)
AST: 107 U/L — AB (ref 15–41)
Anion gap: 9 (ref 5–15)
BUN: 9 mg/dL (ref 6–20)
CALCIUM: 8.2 mg/dL — AB (ref 8.9–10.3)
CHLORIDE: 98 mmol/L — AB (ref 101–111)
CO2: 28 mmol/L (ref 22–32)
CREATININE: 0.52 mg/dL (ref 0.44–1.00)
GFR calc non Af Amer: >60 mL/min (ref >60)
GLUCOSE: 85 mg/dL (ref 65–99)
Potassium: 3.3 mmol/L — ABNORMAL LOW (ref 3.5–5.1)
SODIUM: 135 mmol/L (ref 135–145)
Total Bilirubin: 8.6 mg/dL — ABNORMAL HIGH (ref 0.3–1.2)
Total Protein: 5.5 g/dL — ABNORMAL LOW (ref 6.5–8.1)

## 2015-06-05 LAB — CBC
HCT: 34.8 % — ABNORMAL LOW (ref 36.0–46.0)
HEMOGLOBIN: 11.7 g/dL — AB (ref 12.0–15.0)
MCH: 36.1 pg — ABNORMAL HIGH (ref 26.0–34.0)
MCHC: 33.6 g/dL (ref 30.0–36.0)
MCV: 107.4 fL — ABNORMAL HIGH (ref 78.0–100.0)
PLATELETS: 133 10*3/uL — AB (ref 150–400)
RBC: 3.24 MIL/uL — AB (ref 3.87–5.11)
RDW: 18.5 % — ABNORMAL HIGH (ref 11.5–15.5)
WBC: 11.4 10*3/uL — AB (ref 4.0–10.5)

## 2015-06-05 MED ORDER — POTASSIUM CHLORIDE CRYS ER 10 MEQ PO TBCR
40.0000 meq | EXTENDED_RELEASE_TABLET | Freq: Once | ORAL | Status: AC
  Administered 2015-06-05: 40 meq via ORAL
  Filled 2015-06-05: qty 4

## 2015-06-05 MED ORDER — FLUCONAZOLE 200 MG PO TABS: 200.0000 mg | ORAL_TABLET | Freq: Every day | ORAL | Status: DC

## 2015-06-05 MED ORDER — SPIRONOLACTONE 100 MG PO TABS: 100.0000 mg | ORAL_TABLET | Freq: Two times a day (BID) | ORAL | Status: DC

## 2015-06-05 MED ORDER — FUROSEMIDE 40 MG PO TABS: 40.0000 mg | ORAL_TABLET | Freq: Two times a day (BID) | ORAL | Status: DC

## 2015-06-05 MED ORDER — PANTOPRAZOLE SODIUM 40 MG PO TBEC: 40.0000 mg | DELAYED_RELEASE_TABLET | Freq: Every day | ORAL | Status: DC

## 2015-06-05 MED ORDER — LACTULOSE 10 GM/15ML PO SOLN: 10.0000 g | Freq: Two times a day (BID) | ORAL | Status: DC

## 2015-06-05 MED ORDER — FUROSEMIDE 40 MG PO TABS
40.0000 mg | ORAL_TABLET | Freq: Two times a day (BID) | ORAL | Status: DC
  Filled 2015-06-05 (×2): qty 1

## 2015-06-05 MED ORDER — RIFAXIMIN 550 MG PO TABS: 550.0000 mg | ORAL_TABLET | Freq: Two times a day (BID) | ORAL | Status: DC

## 2015-06-05 NOTE — Discharge Summary (Addendum)
Physician Discharge Summary  Anne Black:[id number] DOB: 1960-11-30 DOA: 05/18/2015  PCP: Anne Edin, MD  Admit date: 05/18/2015 Discharge date: 06/05/2015  Recommendations for Outpatient Follow-up:  1. Pt will need to follow up with PCP in 1 week post discharge 2. Please obtain BMP and CBC in one week  Discharge Diagnoses:  Sepsis due to UTI vs SBP - treated for Klebsiella and E coli UTI with Zosyn and Rocephin for 9 days - paracentesis done in ER but no cell count sent - 9 days of treatment With no recurrence of abdominal pain - has severe diffuse candida dermatitis with pustules on buttocks which may be why her WBC count was still 17- has been getting nystatin ointment- added Diflucan on 4/22 -WBC improving -continue fluconazole 200 mg po x 4 more days after discharge to finish 10 days of tx for her candida dermatitis -06/04/2015 requested repeat paracentesis, but there was not enough fluid to safely perform paracentesis  Active Problems: Encephalopathy, hepatic  - Xifaxin- she is refusing Lactulose- seems stable just on Xifaxin- d/c'd Lactulose -4/26--continues to refuse lactulose after discussion of risks, benefits, alternatives--pt remains A&O x 3 -I discussed the possibility that her insurance company may not pay for the rifaximin. She states that she wanted an additional prescription for lactulose if that is the case.  Alcoholic cirrhosis with ascites and mild thrombocytopenia - viral hepatitis panel negative -cont Lasix and Aldactone-- talked to her about limiting IVF -was not on diuretics prior to coming to the hospital -SAAG >1.1 - I and O inaccurate- follow daily weights -NEG 11 pounds in past 10 days; discharge weight 191 lbs - jaundice with severely elevated Bilirubin (13-14) - noncompliant with fluid restriction -Change furosemide to po--home with 40 mg twice a day -Continue Aldactone--home with 100 mg twice a day -4/26--request repeat paracentesis by  IR--not enough fluid to safely perform  ETOH withdrawal - resolved  Hypokalemia - due to lasix and poor PO intake - repleted   Anxiety/depression/bipolar disorder - Continue Cogentin, Risperdal, Prozac Psych consulted for capacity eval, patient has intermittently refusing meds/testing/refusing snf placement- per nursing she is much better the week of d/c as compared to last week- psych noted on 4/22 that she has capacity to make medical decisions but need to follow with outpt psych  Multiple sclerosis/ADHD - Provigil on hold due to elevated LFTs  -follow up with outpt neurology  Tobacco abuse - nicotine patch stopped on 4/15  Migraine headaches -Topamax discontinued without recurrence of headache   Discharge Condition: stable  Disposition: home--refuses SNF  Diet:low sodium Wt Readings from Last 3 Encounters:  06/05/15 86.637 kg (191 lb)  02/21/15 94.257 kg (207 lb 12.8 oz)  12/27/14 94.257 kg (207 lb 12.8 oz)    History of present illness:  55 y/o female with hx of history of multiple sclerosis, bipolar disorder, depression and anxiety, presents emergency department with complaints of abdominal pain, nausea and vomiting, diarrhea. She was tachycardic, tachypneic, WBC count 13, lactic acid 5.4. Source suspected to be SBP vs UTI. U cx grew Klebsiella and E coli which was treated for 9 days of appropriate antibiotics. WBC count initially did not improve but other signs of sepsis resolved.  However, the patient was started on fluconazole for treatment of dermatophytosis. After institution of fluconazole, WBC improved. The patient will be discharged home with 4 additional days to finish 10 days of therapy.  4/25: Refusing SNF. Her mother is in the room and states she cannot take care  of herself and she does not allow any family to come in the house. The patient is agreeable to Home health coming into the house but does not want her family to come in.     Consultants: psychiatry  Discharge Exam: Filed Vitals:   06/04/15 2120 06/05/15 0536  BP: 123/71 114/60  Pulse: 90 81  Temp: 97.4 F (36.3 C) 98.2 F (36.8 C)  Resp: 18 20   Filed Vitals:   06/04/15 1308 06/04/15 2120 06/05/15 0442 06/05/15 0536  BP: 128/73 123/71  114/60  Pulse: 89 90  81  Temp: 97.3 F (36.3 C) 97.4 F (36.3 C)  98.2 F (36.8 C)  TempSrc:  Oral  Oral  Resp: 20 18  20   Height:      Weight:   86.637 kg (191 lb)   SpO2: 97% 93%  94%   General: A&O x 3, NAD, pleasant, cooperative Cardiovascular: RRR, no rub, no gallop, no S3 Respiratory: Dementia breath sounds at the bases without any wheezing. Abdomen:soft, nontender, nondistended, positive bowel sounds Extremities: 1+LE edema, No lymphangitis, no petechiae  Discharge Instructions      Discharge Instructions    Diet - low sodium heart healthy    Complete by:  As directed      Increase activity slowly    Complete by:  As directed             Medication List    STOP taking these medications        chlorpheniramine-HYDROcodone 10-8 MG/5ML Suer  Commonly known as:  TUSSIONEX PENNKINETIC ER     cyclobenzaprine 10 MG tablet  Commonly known as:  FLEXERIL     gabapentin 100 MG capsule  Commonly known as:  NEURONTIN     hydrochlorothiazide 12.5 MG tablet  Commonly known as:  HYDRODIURIL     hydrOXYzine 25 MG tablet  Commonly known as:  ATARAX/VISTARIL     lisdexamfetamine 50 MG capsule  Commonly known as:  VYVANSE     modafinil 200 MG tablet  Commonly known as:  PROVIGIL      TAKE these medications        albuterol 108 (90 Base) MCG/ACT inhaler  Commonly known as:  PROVENTIL HFA;VENTOLIN HFA  Inhale 2 puffs into the lungs every 6 (six) hours as needed for wheezing.     amantadine 100 MG capsule  Commonly known as:  SYMMETREL  Take 1 capsule (100 mg total) by mouth 2 (two) times daily. Reported on 02/20/2015     benztropine 0.5 MG tablet  Commonly known as:  COGENTIN  Take  1 tablet (0.5 mg total) by mouth at bedtime.     clonazePAM 1 MG tablet  Commonly known as:  KLONOPIN  Take 1 tablet (1 mg total) by mouth 2 (two) times daily as needed for anxiety.     diphenhydrAMINE 25 MG tablet  Commonly known as:  BENADRYL  Take 50 mg by mouth at bedtime as needed for sleep.     fluconazole 200 MG tablet  Commonly known as:  DIFLUCAN  Take 1 tablet (200 mg total) by mouth daily.  Start taking on:  06/06/2015     FLUoxetine 20 MG capsule  Commonly known as:  PROZAC  Take 1 capsule (20 mg total) by mouth daily. Reported on 02/20/2015     Fluticasone-Salmeterol 250-50 MCG/DOSE Aepb  Commonly known as:  ADVAIR  Inhale 1 puff into the lungs 2 (two) times daily.     furosemide 40 MG  tablet  Commonly known as:  LASIX  Take 1 tablet (40 mg total) by mouth 2 (two) times daily.     gi cocktail Susp suspension  Take 30 mLs by mouth 2 (two) times daily as needed for indigestion. Shake well.     lactulose 10 GM/15ML solution  Commonly known as:  CHRONULAC  Take 15 mLs (10 g total) by mouth 2 (two) times daily.     lidocaine 5 %  Commonly known as:  LIDODERM  Place 1 patch onto the skin daily as needed (for area underarm for redness/swelling/pain.). Reported on 04/06/2015     meloxicam 7.5 MG tablet  Commonly known as:  MOBIC  Take 7.5 mg by mouth daily.     nicotine 21 mg/24hr patch  Commonly known as:  NICODERM CQ - dosed in mg/24 hours  Place 1 patch (21 mg total) onto the skin daily.     ondansetron 4 MG tablet  Commonly known as:  ZOFRAN  Take 1 tablet (4 mg total) by mouth every 8 (eight) hours as needed for nausea or vomiting.     pantoprazole 40 MG tablet  Commonly known as:  PROTONIX  Take 1 tablet (40 mg total) by mouth daily.     rifaximin 550 MG Tabs tablet  Commonly known as:  XIFAXAN  Take 1 tablet (550 mg total) by mouth 2 (two) times daily.     risperiDONE 0.5 MG tablet  Commonly known as:  RISPERDAL  Take 5 tablets (2.5 mg total) by  mouth at bedtime.     rizatriptan 10 MG tablet  Commonly known as:  MAXALT  Take 10 mg by mouth daily as needed for migraine. Reported on 02/20/2015     spironolactone 100 MG tablet  Commonly known as:  ALDACTONE  Take 1 tablet (100 mg total) by mouth 2 (two) times daily.     sucralfate 1 GM/10ML suspension  Commonly known as:  CARAFATE  Take 1 g by mouth 4 (four) times daily -  with meals and at bedtime.     topiramate 100 MG tablet  Commonly known as:  TOPAMAX  Take 1 tablet (100 mg total) by mouth daily.         The results of significant diagnostics from this hospitalization (including imaging, microbiology, ancillary and laboratory) are listed below for reference.    Significant Diagnostic Studies: Dg Chest 1 View  05/28/2015  CLINICAL DATA:  Ongoing cough which has worsened, some shortness of breath, multiple sclerosis, smoker EXAM: CHEST 1 VIEW COMPARISON:  Portable exam 1405 hours compared to 05/18/2015 FINDINGS: Enlargement of cardiac silhouette. Mediastinal contours and pulmonary vascularity normal. Lungs clear. No pleural effusion or pneumothorax. Bones demineralized. IMPRESSION: Minimal enlargement of cardiac silhouette without acute infiltrate. Electronically Signed   By: Ulyses Southward M.D.   On: 05/28/2015 14:37   Dg Shoulder Right  05/18/2015  CLINICAL DATA:  Pt c/o diffuse right shoulder pain s/p alleged fall this week, pt not able, or willing, to communicate more hx. Pt states she hurts all over. Epic chart states pt hx MS, symptoms worsening this week. Pt also c/o n/v EXAM: RIGHT SHOULDER - 2+ VIEW COMPARISON:  None. FINDINGS: No fracture. No dislocation. No significant arthropathic changes. No bone lesion. Soft tissues are unremarkable. IMPRESSION: No fracture or dislocation. Electronically Signed   By: Amie Portland M.D.   On: 05/18/2015 09:52   US Abdomen Complete  05/13/2015  CLINICAL DATA:  Abdominal pain. EXAM: ABDOMEN ULTRASOUND COMPLETE COMPARISON:  No prior.  FINDINGS: Gallbladder: No gallstones or wall thickening visualized. No sonographic Murphy sign noted by sonographer. Common bile duct: Diameter: 4.1 mm Liver: Liver is echogenic consistent fatty infiltration and/or hepatocellular disease. No focal hepatic abnormality identified . IVC: No abnormality visualized. Pancreas: Visualized portion unremarkable. Spleen: Size and appearance within normal limits. Right Kidney: Length: 10.8 cm. Echogenicity within normal limits. No mass or hydronephrosis visualized. Left Kidney: Length: 10.2 cm. Echogenicity within normal limits. No mass or hydronephrosis visualized. Abdominal aorta: No aneurysm visualized. Other findings: Mild ascites. IMPRESSION: 1. Liver is echogenic consistent fatty infiltration and/or hepatocellular disease. No focal hepatic abnormality identified. 2.  Mild ascites. Electronically Signed   By: Maisie Fus  Register   On: 05/13/2015 07:30   Ct Abdomen Pelvis W Contrast  05/18/2015  CLINICAL DATA:  55 year old presenting with 1 day history of generalized abdominal pain, abdominal distention nausea and vomiting, and diarrhea. Patient has hyperbilirubinemia on laboratory evaluation. EXAM: CT ABDOMEN AND PELVIS WITH CONTRAST TECHNIQUE: Multidetector CT imaging of the abdomen and pelvis was performed using the standard protocol following bolus administration of intravenous contrast. CONTRAST:  ISOVUE-300 IOPAMIDOL INJECTION 61% IV. Oral contrast was also administered. COMPARISON:  Abdominal ultrasound performed earlier today. No prior CT. FINDINGS: Lower chest: Linear atelectasis or scarring in the lingula. Visualized lung bases otherwise clear. Heart size normal. Hepatobiliary: Severe diffuse hepatic steatosis. Slight irregularity of the liver contour. Relative enlargement of the left lobe and caudate lobe. No visible hepatic masses. Gallbladder unremarkable by CT (apparent high attenuation bile is artifactual and related to the fact that the normal bile is  of higher attenuation than the severe fatty liver). No biliary ductal dilation. Pancreas: Normal in appearance without evidence of mass, ductal dilation, or inflammation. Spleen: Normal in size and appearance. Adrenals/Urinary Tract: Normal appearing adrenal glands. Kidneys normal in size and appearance without focal parenchymal abnormality. No evidence of urinary tract calculi or obstruction. Normal-appearing decompressed urinary bladder. Stomach/Bowel: Stomach normal in appearance for the degree of distention. Oral contrast material within the distal esophagus which demonstrates mild circumferential wall thickening. No hiatal hernia. Wall thickening involving multiple loops of small bowel, particularly the contrast filled jejunum. Wall thickening involving the decompressed colon to the level of the sigmoid. Appendix not clearly demonstrated. Vascular/Lymphatic: Mild aortoiliac atherosclerosis without aneurysm. Normal-appearing portal venous and systemic venous systems. No pathologic lymphadenopathy. Reproductive: Normal-appearing uterus. Endometrial thickening and/or fluid likely related to the menstrual phase. No adnexal masses. Other: Moderate amount of ascites throughout the abdomen and pelvis. Diffuse body wall edema. Musculoskeletal: No acute abnormalities. IMPRESSION: 1. Hepatic cirrhosis. Severe diffuse hepatic steatosis. No hepatic parenchymal masses. 2. Moderate amount of ascites throughout the abdomen and pelvis. 3. Wall thickening involving the small bowel and colon. I note that the patient has hypoproteinemia and hypoalbuminemia, and this may account for the diffuse bowel wall thickening. 4. Anasarca. Electronically Signed   By: Hulan Saas M.D.   On: 05/18/2015 12:10   US Abdomen Limited  06/04/2015  CLINICAL DATA:  Patient with a history of alcoholic cirrhosis and ascites. She is currently on diuretics to help with control of her ascites. A request for a diagnostic and therapeutic  paracentesis has been made. EXAM: LIMITED ABDOMEN ULTRASOUND FOR ASCITES TECHNIQUE: Limited ultrasound survey for ascites was performed in all four abdominal quadrants. COMPARISON:  None. FINDINGS: A small pocket of ascites was noted in the central lower abdomen just next to the bladder and superior to the uterus. All other quadrants were scanned with no  significant ascites. IMPRESSION: Insufficient ascites noted in the abdominal cavity to be able to safely perform a paracentesis. Read by: Barnetta Chapel, PA-C Electronically Signed   By: Malachy Moan M.D.   On: 06/04/2015 16:04   Dg Chest Port 1 View  05/18/2015  CLINICAL DATA:  Patient with sepsis.  Cough. EXAM: PORTABLE CHEST 1 VIEW COMPARISON:  Chest radiograph 04/06/2015. FINDINGS: Multiple monitoring leads overlie the patient. Stable cardiac and mediastinal contours. No consolidative pulmonary opacity. No pleural effusion or pneumothorax. IMPRESSION: No acute cardiopulmonary process. Electronically Signed   By: Annia Belt M.D.   On: 05/18/2015 15:54     Microbiology: No results found for this or any previous visit (from the past 240 hour(s)).   Labs: Basic Metabolic Panel:  Recent Labs Lab 05/31/15 0527 06/01/15 0549 06/02/15 0527 06/03/15 0539 06/04/15 0537 06/05/15 0513  NA 133* 136 135 134* 134* 135  K 3.2* 3.3* 3.9 3.8 4.0 3.3*  CL 99* 102 103 99* 100* 98*  CO2 GLUCOSE 94 97 108* 98 107* 85  BUN 6 5* CREATININE <0.30* 0.37* <0.30* 0.47 <0.30* 0.52  CALCIUM 8.5* 8.3* 8.4* 8.2* 8.3* 8.2*  MG 1.8  --   --   --   --   --    Liver Function Tests:  Recent Labs Lab 06/01/15 0549 06/02/15 0527 06/03/15 0539 06/04/15 0537 06/05/15 0513  AST 117* 110* 102* 119* 107*  ALT 55* 54 48 48 47  ALKPHOS 215* 209* 181* 180* 169*  BILITOT 13.1* 12.1* 10.5* 8.9* 8.6*  PROT 5.8* 5.5* 5.5* 5.3* 5.5*  ALBUMIN 1.9* 1.9* 1.9* 2.0* 2.0*   No results for input(s): LIPASE, AMYLASE in the last 168  hours.  Recent Labs Lab 05/31/15 0528  AMMONIA 49*   CBC:  Recent Labs Lab 06/02/15 0527 06/02/15 1329 06/03/15 0539 06/04/15 0537 06/05/15 0513  WBC 14.1* 13.1* 11.8* 11.7* 11.4*  HGB 12.2 12.3 11.7* 12.1 11.7*  HCT 35.8* 36.1 34.5* 35.5* 34.8*  MCV 107.5* 108.7* 106.5* 108.6* 107.4*  PLT 135* 138* 123* 147* 133*   Cardiac Enzymes: No results for input(s): CKTOTAL, CKMB, CKMBINDEX, TROPONINI in the last 168 hours. BNP: Invalid input(s): POCBNP CBG:  Recent Labs Lab 06/01/15 2140  GLUCAP 94    Time coordinating discharge:  Greater than 30 minutes  Signed:  Edwinna Rochette, DO [facility] Pager: [phone] 06/05/2015, 10:31 AM

## 2015-06-05 NOTE — Progress Notes (Signed)
Pt called 5 [facility]ST at [facility] after discharge requesting to speak to me.  I called her back. She asked if I can write her a prescription for Vyvanse which she has taken in the past.   I told her that she needs to follow up with her PCP or whomever wrote that prescription for her as she will need close follow up.  I told her, I will not write for this Rx.  DTat

## 2015-06-05 NOTE — Progress Notes (Signed)
Discharge instructions given to pt, verbalized understanding, left the unit in stable condition. 

## 2015-06-05 NOTE — Telephone Encounter (Signed)
Pt asking to trans care to Dr Beverely Low, Pt was just discharged from hosp and will need a NP hosp follow up this week, please advise.

## 2015-06-09 ENCOUNTER — Telehealth: Payer: Self-pay | Admitting: Family Medicine

## 2015-06-09 NOTE — Telephone Encounter (Signed)
Spoke with [facility].  Informed her that patient was suppose to go to Dr. Beverely Low when she left the hospital for a follow up appointment.  They are going to try and reach the [facility].

## 2015-06-10 ENCOUNTER — Emergency Department (HOSPITAL_COMMUNITY): Payer: Managed Care, Other (non HMO)

## 2015-06-10 ENCOUNTER — Encounter (HOSPITAL_COMMUNITY): Payer: Self-pay | Admitting: *Deleted

## 2015-06-10 ENCOUNTER — Emergency Department (HOSPITAL_COMMUNITY)
Admission: EM | Admit: 2015-06-10 | Discharge: 2015-06-10 | Discharge status: Against Medical Advice | Payer: Managed Care, Other (non HMO) | Attending: Emergency Medicine | Admitting: Emergency Medicine

## 2015-06-10 DIAGNOSIS — R5383 Other fatigue: Secondary | ICD-10-CM | POA: Diagnosis not present

## 2015-06-10 DIAGNOSIS — Z79899 Other long term (current) drug therapy: Secondary | ICD-10-CM | POA: Insufficient documentation

## 2015-06-10 DIAGNOSIS — R5381 Other malaise: Secondary | ICD-10-CM

## 2015-06-10 DIAGNOSIS — Z791 Long term (current) use of non-steroidal anti-inflammatories (NSAID): Secondary | ICD-10-CM | POA: Diagnosis not present

## 2015-06-10 DIAGNOSIS — N939 Abnormal uterine and vaginal bleeding, unspecified: Secondary | ICD-10-CM | POA: Diagnosis not present

## 2015-06-10 DIAGNOSIS — G35 Multiple sclerosis: Secondary | ICD-10-CM | POA: Diagnosis not present

## 2015-06-10 DIAGNOSIS — Z7951 Long term (current) use of inhaled steroids: Secondary | ICD-10-CM | POA: Insufficient documentation

## 2015-06-10 DIAGNOSIS — M1990 Unspecified osteoarthritis, unspecified site: Secondary | ICD-10-CM | POA: Insufficient documentation

## 2015-06-10 DIAGNOSIS — M810 Age-related osteoporosis without current pathological fracture: Secondary | ICD-10-CM | POA: Insufficient documentation

## 2015-06-10 DIAGNOSIS — F909 Attention-deficit hyperactivity disorder, unspecified type: Secondary | ICD-10-CM | POA: Insufficient documentation

## 2015-06-10 DIAGNOSIS — F319 Bipolar disorder, unspecified: Secondary | ICD-10-CM | POA: Diagnosis not present

## 2015-06-10 DIAGNOSIS — R140 Abdominal distension (gaseous): Secondary | ICD-10-CM | POA: Insufficient documentation

## 2015-06-10 DIAGNOSIS — Z792 Long term (current) use of antibiotics: Secondary | ICD-10-CM | POA: Insufficient documentation

## 2015-06-10 DIAGNOSIS — F17210 Nicotine dependence, cigarettes, uncomplicated: Secondary | ICD-10-CM | POA: Insufficient documentation

## 2015-06-10 LAB — URINE MICROSCOPIC-ADD ON

## 2015-06-10 LAB — COMPREHENSIVE METABOLIC PANEL
ALT: 57 U/L — ABNORMAL HIGH (ref 14–54)
ANION GAP: 7 (ref 5–15)
AST: 113 U/L — ABNORMAL HIGH (ref 15–41)
Albumin: 2.1 g/dL — ABNORMAL LOW (ref 3.5–5.0)
Alkaline Phosphatase: 143 U/L — ABNORMAL HIGH (ref 38–126)
BUN: 6 mg/dL (ref 6–20)
CHLORIDE: 103 mmol/L (ref 101–111)
CO2: 26 mmol/L (ref 22–32)
Calcium: 8.7 mg/dL — ABNORMAL LOW (ref 8.9–10.3)
Creatinine, Ser: 0.45 mg/dL (ref 0.44–1.00)
Glucose, Bld: 113 mg/dL — ABNORMAL HIGH (ref 65–99)
POTASSIUM: 4 mmol/L (ref 3.5–5.1)
SODIUM: 136 mmol/L (ref 135–145)
Total Bilirubin: 6.4 mg/dL — ABNORMAL HIGH (ref 0.3–1.2)
Total Protein: 5.8 g/dL — ABNORMAL LOW (ref 6.5–8.1)

## 2015-06-10 LAB — URINALYSIS, ROUTINE W REFLEX MICROSCOPIC
Glucose, UA: NEGATIVE mg/dL
Ketones, ur: NEGATIVE mg/dL
NITRITE: NEGATIVE
PROTEIN: NEGATIVE mg/dL
Specific Gravity, Urine: 1.011 (ref 1.005–1.030)
pH: 6.5 (ref 5.0–8.0)

## 2015-06-10 LAB — CBC WITH DIFFERENTIAL/PLATELET
BASOS PCT: 1 %
Basophils Absolute: 0.1 10*3/uL (ref 0.0–0.1)
EOS ABS: 0.5 10*3/uL (ref 0.0–0.7)
EOS PCT: 4 %
HCT: 36.2 % (ref 36.0–46.0)
Hemoglobin: 12.1 g/dL (ref 12.0–15.0)
LYMPHS ABS: 2.1 10*3/uL (ref 0.7–4.0)
Lymphocytes Relative: 17 %
MCH: 37 pg — AB (ref 26.0–34.0)
MCHC: 33.4 g/dL (ref 30.0–36.0)
MCV: 110.7 fL — AB (ref 78.0–100.0)
MONO ABS: 0.9 10*3/uL (ref 0.1–1.0)
Monocytes Relative: 7 %
NEUTROS PCT: 71 %
Neutro Abs: 8.8 10*3/uL — ABNORMAL HIGH (ref 1.7–7.7)
PLATELETS: 167 10*3/uL (ref 150–400)
RBC: 3.27 MIL/uL — AB (ref 3.87–5.11)
RDW: 16 % — AB (ref 11.5–15.5)
WBC: 12.4 10*3/uL — AB (ref 4.0–10.5)

## 2015-06-10 LAB — AMMONIA: AMMONIA: 35 umol/L (ref 9–35)

## 2015-06-10 LAB — I-STAT CG4 LACTIC ACID, ED: LACTIC ACID, VENOUS: 1.73 mmol/L (ref 0.5–2.0)

## 2015-06-10 LAB — ETHANOL

## 2015-06-10 LAB — BRAIN NATRIURETIC PEPTIDE: B NATRIURETIC PEPTIDE 5: 68.1 pg/mL (ref 0.0–100.0)

## 2015-06-10 NOTE — ED Notes (Signed)
Patient leaving AMA, informed that she is welcome to stay and receive treatment. Pt states she intends to leave. Pt informed that provider did not discharge patient and that provider felt more testing was needed to consider patient safe to leave hospital. Patient continues plan to leave AMA. Pt informed she may return to ED at any time.

## 2015-06-10 NOTE — ED Notes (Signed)
Pt refused pelvic exam and chest x-ray. Rolm Gala, PA aware and informed pt of importance of these tests and pt verbalized understanding of the potential complications of not having these done.

## 2015-06-10 NOTE — Progress Notes (Signed)
Called in for meals on wheels to [phone]  no qualifying related age 55

## 2015-06-10 NOTE — ED Notes (Addendum)
Patient is from home with complaint of vaginal bleeding that began 2 weeks ago prior to her discharge from the hospital. Patient was supposed to be discharged to a rehab facility, but refused. She states that she was at home taking care of her home. She has history of cirrhosis and is just having a hard time managing at home. Patient has been wearing sanitary napkins. Patient states that she needs help at home.

## 2015-06-10 NOTE — Discharge Instructions (Signed)
Deconditioning  Deconditioning refers to the changes in your body that occur during a period of inactivity. Deconditioning results in changes to your heart, lungs, and muscles. These changes decrease your ability to endure activity, resulting in feelings of fatigue and weakness.  Deconditioning can occur after only a few days of bed rest or inactivity. The longer the period of inactivity, the more severe your symptoms of deconditioning will be. After longer periods of inactivity, it will also take longer for you to return to your previous level of functioning.  Deconditioning can be mild, moderate, or severe:  · Mild. Your condition interferes with your ability to perform your usual types of exercise, such as running, biking, or swimming.  · Moderate. Your condition interferes with your ability to do normal everyday activities. This may include walking, grocery shopping, or doing chores or lawn work.  · Severe. Your condition interferes with your ability to perform minimal activity or normal self-care.  CAUSES   Some common reasons for inactivity that may result in deconditioning include:  · Illnesses, such as cancer, stroke, heart attack, fibromyalgia, or chronic fatigue syndrome.  · Injuries, especially back injuries, broken bones, or injury to ligaments or tendons.  · Surgery or a long stay in the hospital for any reason.  · Pregnancy, especially with conditions that require long periods of bed rest.  RISK FACTORS  Anything that results in a period of hospitalization or bed rest will put you at risk of deconditioning. Some other factors that can increase the risk include:  · Obesity.  · Poor nutrition.  · Old age.  · Injuries or illnesses that interfere with movement and activity.  SIGNS AND SYMPTOMS  · Feeling weak.  · Feeling tired.  · Shortness of breath with minor exertion.  · Your heart beating faster than normal. You may or may not notice this without taking your pulse.  · Pain or discomfort with  activity.  · Decreased strength.  · Decreased sense of balance.  · Decreased endurance.  · Difficulty participating in usual forms of exercise.  · Difficulty doing activities of daily living, such as grocery shopping or chores.  · Difficulty walking around the house and doing basic self-care, such as getting to the bathroom, preparing meals, or doing laundry.  DIAGNOSIS   There is no specific test to diagnose deconditioning. Your health care provider will take your medical history and do a physical exam. During the physical exam, the health care provider will check for signs of deconditioning, such as:  · Decreased size of muscles.  · Decreased strength.  · Difficulty with balance.  · Shortness of breath or abnormally increased heart rate after minor exertion.  TREATMENT   Treatment usually involves a structured exercise program in which activity is increased gradually. Your health care provider will determine which exercises are right for you. The exercise program will likely include aerobic exercise and strength training. Aerobic exercise helps improve the functioning of the heart and lungs as well as the muscles. Strength training helps improve muscle size and strength. Both of these types of exercise will improve your endurance. You may be referred to a physical therapist who can create a safe strengthening program for you to follow.  HOME CARE INSTRUCTIONS  · Follow the exercise program recommended by your health care provider or physical therapist.  ? Do not increase your exercise any faster than directed.  · Eat a healthy diet.  · If your health care provider thinks that you   need to lose weight, consider seeing a dietitian to help you do so in a healthy way.  · Do not use any tobacco products, including cigarettes, chewing tobacco, or electronic cigarettes. If you need help quitting, ask your health care provider.  · Take medicines only as directed by your health care provider.  · Keep all follow-up visits as  directed by your health care provider. This is important.  SEEK MEDICAL CARE IF:  · You are not able to carry out the prescribed exercise program.  · You are not able to carry out your usual level of activity.  · You are having trouble doing normal household chores or caring for yourself.  · You are becoming increasingly fatigued and weak.  · You become light-headed when rising to a sitting or standing position.  · Your level of endurance decreases after having improved.  SEEK IMMEDIATE MEDICAL CARE IF:  · You have chest pain.  · You are very short of breath.  · You have any episodes of passing out.     This information is not intended to replace advice given to you by your health care provider. Make sure you discuss any questions you have with your health care provider.     Document Released: 06/11/2013 Document Reviewed: 06/11/2013  [employer]vier Interactive Patient Education ©2016 [employer]   

## 2015-06-10 NOTE — ED Provider Notes (Signed)
CSN: [id number]     Arrival date & time 06/10/15  1126 History   First MD Initiated Contact with Patient 06/10/15 1217     No chief complaint on file.  HPI  Anne Black is a 55 year old female with past medical history of bipolar disorder, ADHD, anxiety, multiple sclerosis and migraines presenting with weakness and fatigue. Patient reports she was recently discharged from hospital for alcoholic cirrhosis and sepsis secondary to UTI vs SBP and was recommended to move to a rehabilitation facility. Patient declined this and returned home. She reports worsening weakness since her discharge a few weeks ago. She reports severe fatigue and inability to pull herself from a lying or seated position to standing. She states that her family is insisting that she return to the hospital because they can no longer care for her. She is also complaining of mild, persistent nausea without vomiting. She also notes that she has been having vaginal bleeding that started while she was in the hospital and has persisted. He states that it feels like a prolonged period. Patient is somewhat difficult to interview as she is focused on anger towards family members for insisting she return to the hospital for placement. She does admit to not being able to care for herself at home do to her fatigue. Denies all other complaints. States that she is only here for placement.  Past Medical History  Diagnosis Date  . Multiple sclerosis (HCC)   . Arthritis   . Anxiety   . Bipolar disorder (HCC)   . ADHD (attention deficit hyperactivity disorder)   . Migraine   . Osteoporosis    Past Surgical History  Procedure Laterality Date  . Cesarean section  [id number]  . Myringotomy with tube placement Bilateral   . Tonsillectomy     Family History  Problem Relation Age of Onset  . Heart disease Mother   . Heart disease Father   . Hypertension Father    Social History  Substance Use Topics  . Smoking status: Current Every Day Smoker  -- 0.75 packs/day for 20 years    Types: Cigarettes  . Smokeless tobacco: Never Used     Comment: is cutting back, not in the car, house  . Alcohol Use: 4.8 oz/week    2 Glasses of wine, 2 Cans of beer, 4 Standard drinks or equivalent per week     Comment: "I'm a social drinker." "I drink a glass of wine nightly."   OB History    No data available     Review of Systems  All other systems reviewed and are negative.     Allergies  Review of patient's allergies indicates no known allergies.  Home Medications   Prior to Admission medications   Medication Sig Start Date End Date Taking? Authorizing Provider  albuterol (PROVENTIL HFA;VENTOLIN HFA) 108 (90 BASE) MCG/ACT inhaler Inhale 2 puffs into the lungs every 6 (six) hours as needed for wheezing. 02/28/14  Yes Tiffany L Reed, DO  Alum & Mag Hydroxide-Simeth (GI COCKTAIL) SUSP suspension Take 30 mLs by mouth 2 (two) times daily as needed for indigestion. Shake well.   Yes Historical Provider, MD  amantadine (SYMMETREL) 100 MG capsule Take 1 capsule (100 mg total) by mouth 2 (two) times daily. Reported on 02/20/2015 02/21/15  Yes Thermon Leyland, NP  benztropine (COGENTIN) 0.5 MG tablet Take 1 tablet (0.5 mg total) by mouth at bedtime. 02/21/15  Yes Thermon Leyland, NP  clonazePAM (KLONOPIN) 1 MG tablet Take 1 tablet (  1 mg total) by mouth 2 (two) times daily as needed for anxiety. 04/06/15  Yes Chelle Jeffery, PA-C  diphenhydrAMINE (BENADRYL) 25 MG tablet Take 50 mg by mouth at bedtime as needed for sleep.    Yes Historical Provider, MD  FLUoxetine (PROZAC) 20 MG capsule Take 1 capsule (20 mg total) by mouth daily. Reported on 02/20/2015 04/15/15  Yes Chelle Jeffery, PA-C  Fluticasone-Salmeterol (ADVAIR) 250-50 MCG/DOSE AEPB Inhale 1 puff into the lungs 2 (two) times daily.   Yes Historical Provider, MD  furosemide (LASIX) 40 MG tablet Take 1 tablet (40 mg total) by mouth 2 (two) times daily. 06/05/15  Yes Catarina Hartshorn, MD  lidocaine (LIDODERM) 5 %  Place 1 patch onto the skin daily as needed (for area underarm for redness/swelling/pain.). Reported on 04/06/2015   Yes Historical Provider, MD  meloxicam (MOBIC) 7.5 MG tablet Take 7.5 mg by mouth daily.  03/17/15  Yes Historical Provider, MD  ondansetron (ZOFRAN) 4 MG tablet Take 1 tablet (4 mg total) by mouth every 8 (eight) hours as needed for nausea or vomiting. 04/06/15  Yes Chelle Jeffery, PA-C  pantoprazole (PROTONIX) 40 MG tablet Take 1 tablet (40 mg total) by mouth daily. 06/05/15  Yes Catarina Hartshorn, MD  rifaximin (XIFAXAN) 550 MG TABS tablet Take 1 tablet (550 mg total) by mouth 2 (two) times daily. 06/05/15  Yes Catarina Hartshorn, MD  risperiDONE (RISPERDAL) 0.5 MG tablet Take 5 tablets (2.5 mg total) by mouth at bedtime. Patient taking differently: Take 1.5 mg by mouth at bedtime.  02/21/15  Yes Thermon Leyland, NP  rizatriptan (MAXALT) 10 MG tablet Take 10 mg by mouth daily as needed for migraine. Reported on 02/20/2015 09/05/14  Yes Historical Provider, MD  spironolactone (ALDACTONE) 100 MG tablet Take 1 tablet (100 mg total) by mouth 2 (two) times daily. 06/05/15  Yes Catarina Hartshorn, MD  sucralfate (CARAFATE) 1 GM/10ML suspension Take 1 g by mouth 4 (four) times daily -  with meals and at bedtime.   Yes Historical Provider, MD  topiramate (TOPAMAX) 100 MG tablet Take 1 tablet (100 mg total) by mouth daily. 02/21/15  Yes Thermon Leyland, NP  fluconazole (DIFLUCAN) 200 MG tablet Take 1 tablet (200 mg total) by mouth daily. Patient not taking: Reported on 06/10/2015 06/06/15   Catarina Hartshorn, MD  lactulose (CHRONULAC) 10 GM/15ML solution Take 15 mLs (10 g total) by mouth 2 (two) times daily. Patient not taking: Reported on 06/10/2015 06/05/15   Catarina Hartshorn, MD  nicotine (NICODERM CQ - DOSED IN MG/24 HOURS) 21 mg/24hr patch Place 1 patch (21 mg total) onto the skin daily. Patient not taking: Reported on 06/10/2015 02/21/15   Thermon Leyland, NP   BP 114/72 mmHg  Pulse 81  Temp(Src) 97.9 F (36.6 C) (Oral)  Resp 16  SpO2  99% Physical Exam  Constitutional: She appears well-developed and well-nourished. No distress.  Chronically ill-appearing  HENT:  Head: Normocephalic and atraumatic.  Right Ear: External ear normal.  Left Ear: External ear normal.  Dry mucous membranes  Eyes: Conjunctivae are normal. Right eye exhibits no discharge. Left eye exhibits no discharge. Scleral icterus is present.  Neck: Normal range of motion. Neck supple.  Cardiovascular: Normal rate, regular rhythm and normal heart sounds.   Pulmonary/Chest:  Mildly increased work of breathing when attempting to reposition herself in stretcher. Diminished breath sounds bilaterally. No adventitious breath sounds.  Abdominal: She exhibits distension. There is tenderness. There is no rebound and no guarding.  Abdomen appears  distended with generalized tenderness to palpation. No rebound or guarding.  Genitourinary:  Vaginal bleeding noted. Unable to perform pelvic exam secondary to pain.   Musculoskeletal: Normal range of motion.  Moves all extremities spontaneously  Neurological: She is alert. Coordination normal.  Skin: Skin is warm and dry.  Appears jaundiced  Psychiatric: She has a normal mood and affect. Her behavior is normal.  Nursing note and vitals reviewed.   ED Course  Procedures (including critical care time) Labs Review Labs Reviewed  CBC WITH DIFFERENTIAL/PLATELET - Abnormal; Notable for the following:    WBC 12.4 (*)    RBC 3.27 (*)    MCV 110.7 (*)    MCH 37.0 (*)    RDW 16.0 (*)    Neutro Abs 8.8 (*)    All other components within normal limits  COMPREHENSIVE METABOLIC PANEL - Abnormal; Notable for the following:    Glucose, Bld 113 (*)    Calcium 8.7 (*)    Total Protein 5.8 (*)    Albumin 2.1 (*)    AST 113 (*)    ALT 57 (*)    Alkaline Phosphatase 143 (*)    Total Bilirubin 6.4 (*)    All other components within normal limits  URINALYSIS, ROUTINE W REFLEX MICROSCOPIC (NOT AT [facility]) - Abnormal; Notable for  the following:    Color, Urine RED (*)    APPearance CLOUDY (*)    Hgb urine dipstick LARGE (*)    Bilirubin Urine MODERATE (*)    Leukocytes, UA SMALL (*)    All other components within normal limits  URINE MICROSCOPIC-ADD ON - Abnormal; Notable for the following:    Squamous Epithelial / LPF 0-5 (*)    Bacteria, UA FEW (*)    All other components within normal limits  ETHANOL  AMMONIA  BRAIN NATRIURETIC PEPTIDE  I-STAT CG4 LACTIC ACID, ED  I-STAT CG4 LACTIC ACID, ED    Imaging Review No results found. I have personally reviewed and evaluated these images and lab results as part of my medical decision-making.   EKG Interpretation None     Attempted pelvic exam - pt states it is too painful and refuses to continue Pt also refusing CXR. Discussed with pt at length who still refuses. States she will leave AMA if we force her to get an Xray.   MDM   Final diagnoses:  Physical deconditioning   Patient states she is here for placement to rehabilitation facility due to worsening weakness and fatigue since discharge from hospital last month. Patient denies all other complaints but is noted to have scleral icterus and somewhat jaundiced skin tone. Patient is difficult to interview due to her anger towards family members and unwillingness to be here. Pt eventually admits that she can no longer care for herself and needs placement. Afebrile and hemodynamically stable. Chronically ill appearing. Increased work of breathing when attempting to reposition self but breaths easily at rest. Lungs with diminished breath sounds but no wheeze, rale or crackle. O2 remains above 95%. Abdomen appears distended but pt also states this is has not changed since discharge. Mild, generalized tenderness without peritoneal signs. Pt appears jaundiced but states this is how her skin looks since discharge. Pt is very uncooperative and states she does not want blood work or imaging. She states she only wants  placement  Long discussion with pt who agrees with some work up. Leukocytosis of 12.4. CMP unchanged since bloodwork 5 days ago. UA with small leuks and few  bacteria. Will send for culture. Lactic acid 1.73. Ammonia 35. Ethanol <5. BNP 68. Pt refuses CXR initially. Will get portable CXR.   Discussed with patient who still declines CXR. Pt states she wishes to leave because she has not been placed in a rehab facility yet. Long discussion with patient about importance of imaging and full workup prior to dispositioning patient. Pt continues to remain uncooperative and wishes to leave. I have discussed my concerns as their provider and the possibility that this may worsen. I have specifically discussed that without further evaluation I cannot guarantee there is not a life threatening event occuring.  Pt is A&Ox4, their own POA and states understanding of my concerns and the possible consequences.  I have made pt aware that this is an AMA discharge, but he may return at any time for further evaluation and treatment.     Rolm Gala Hinata Diener, PA-C 06/10/15 1608  Lorre Nick, MD 06/13/15 [phone]

## 2015-06-10 NOTE — Care Management Note (Addendum)
Case Management Note  Patient Details  Name: Anne Black MRN: [id number] Date of Birth: [birth year]  Subjective/Objective:     CM  Consult Pt wants get into rehab. Also requests home health assistance  Pt confirms [facility] and was seen on 06/09/15 by [facility] & PT Pt stating "both are telling me I need to be in rehab"  At bedside is female friend, Anne Black, from [place] who has been visiting for 3 days and plans on returning to [place] Pt use to live in [place] Pt lives alone in a one level home with 5 bedrooms and 4 baths per female Hx liver disease and MS Pt states not walking for about 2 months, has had MS for 23 years. Pt states she was recently d/c from [facility] after being offered snf rehab by SW and refusing because she needed to get home to take "care of business" and refused facility rehab.  Pt went home and HHRN found out pcp is not Daub (daub is leaving [place] [facility] as [facility] was informed and pt has not been assigned a new pcp and can not be assigned a pcp she would have to follow a first come, first serve basis) Pt interested in a female dr near her home but this Dr Can not see her "until July"  Pt has a walker and cane at home Female friend stating w/c is not preferred "I fear if she gets in one she may not come out of one" Pt frequently stating she is hungry and became angry when CM began to assess for snf rehab needs and to encourage her to consider options for home safety (respite services, medical alert devices, etc) Pt informed Cm she was refusing further services like cxr, pelvic exam for c/o vaginal bleeding Pt began to complain about ED services and timeframe of evaluation Cm able to redirect pt and when Cm left pt she agreed to resources   CM reviewed 06/05/15 d/c instructions to find Psych consulted for capacity eval, patient has intermittently refusing meds/testing/refusing snf placement- per nursing she is much better the week of d/c as compared to last week- psych  noted on 4/22 that she has capacity to make medical decisions but need to follow with outpt psych  Action/Plan:  Assessed for present home services, DME, clarification of CM consult CM reviewed in details medicare guidelines, home health [facility]) (length of stay in home, types of [facility] staff available, coverage, primary caregiver, up to 24 hrs before services  may be started) and Private duty nursing (PDN-coverage, length of stay in the home types of staff available).  CM provided pt/family with a list of [place] home health agencies and PDN.  Discussed Dr who make home visits Provided with this list of visiting MDs, [facility] [place] information Discussed not waiting until July to go to the dr near her  Explained to pt why she would not be able to eat at this time until all her ED evaluation tests are completed  1556 spoke with ED NT and ED RN about pt concerns with nausea, wanting to eat ED Rn states pt refused services Cm discussed refusing tests may not assist if pt was eligible for facility placement  Given list of [employer] primary care providers within [place] code [address], a list of skilled nursing facilities, a list of outpt therapy facilities, PDN services  1619 Pt left AMA with Mr Anne Black Pt states she could not wait to be seen by  ED SW after Cm, RN, NT and wayne attempted to encourage her - Anne Black Apologized for pt's behavior and stated he has attempted to get pt consider facility but she became angry at him  Expected Discharge Date:   Pending EDP PA/NP evaluation               Expected Discharge Plan:     In-House Referral:     Discharge planning Services     Post Acute Care Choice:    Choice offered to:   pt  DME Arranged:   na  DME Agency:   na   HH Arranged:   HHRN, PT  HH Agency:   [facility]   Status of Service:   completed     Additional Comments:  Ophelia Shoulder, RN 06/10/2015, 3:36 PM

## 2015-06-11 NOTE — Telephone Encounter (Signed)
Pt is able to establish in the next available new pt appt

## 2015-06-12 NOTE — Telephone Encounter (Signed)
LM for pt to call and schedule NP appt.

## 2015-06-19 ENCOUNTER — Ambulatory Visit (INDEPENDENT_AMBULATORY_CARE_PROVIDER_SITE_OTHER): Payer: Managed Care, Other (non HMO) | Admitting: Physician Assistant

## 2015-06-19 ENCOUNTER — Other Ambulatory Visit: Payer: Self-pay | Admitting: Emergency Medicine

## 2015-06-19 ENCOUNTER — Telehealth: Payer: Self-pay

## 2015-06-19 VITALS — BP 110/76 | HR 98 | Temp 98.0°F | Resp 16 | Ht 62.0 in | Wt 166.8 lb

## 2015-06-19 DIAGNOSIS — R17 Unspecified jaundice: Secondary | ICD-10-CM | POA: Diagnosis not present

## 2015-06-19 DIAGNOSIS — Z09 Encounter for follow-up examination after completed treatment for conditions other than malignant neoplasm: Secondary | ICD-10-CM

## 2015-06-19 DIAGNOSIS — B369 Superficial mycosis, unspecified: Secondary | ICD-10-CM

## 2015-06-19 DIAGNOSIS — F411 Generalized anxiety disorder: Secondary | ICD-10-CM | POA: Diagnosis not present

## 2015-06-19 LAB — POCT CBC
Granulocyte percent: 83.4 %G — AB (ref 37–80)
HCT, POC: 40.9 % (ref 37.7–47.9)
HEMOGLOBIN: 14.2 g/dL (ref 12.2–16.2)
LYMPH, POC: 1.8 (ref 0.6–3.4)
MCH: 36.9 pg — AB (ref 27–31.2)
MCHC: 34.8 g/dL (ref 31.8–35.4)
MCV: 105.9 fL — AB (ref 80–97)
MID (cbc): 0.6 (ref 0–0.9)
MPV: 9.4 fL (ref 0–99.8)
POC GRANULOCYTE: 12 — AB (ref 2–6.9)
POC LYMPH PERCENT: 12.4 %L (ref 10–50)
POC MID %: 4.2 % (ref 0–12)
Platelet Count, POC: 187 10*3/uL (ref 142–424)
RBC: 3.86 M/uL — AB (ref 4.04–5.48)
RDW, POC: 15.2 %
WBC: 14.4 10*3/uL — AB (ref 4.6–10.2)

## 2015-06-19 LAB — POCT URINALYSIS DIP (MANUAL ENTRY)
Glucose, UA: NEGATIVE
LEUKOCYTES UA: NEGATIVE
NITRITE UA: NEGATIVE
Protein Ur, POC: NEGATIVE
RBC UA: NEGATIVE
Spec Grav, UA: 1.01
Urobilinogen, UA: >=8
pH, UA: 6

## 2015-06-19 MED ORDER — HYDROXYZINE HCL 10 MG PO TABS: 10.0000 mg | ORAL_TABLET | Freq: Three times a day (TID) | ORAL | Status: DC | PRN

## 2015-06-19 NOTE — Telephone Encounter (Addendum)
The patient called to inquire about a psychiatry referral.  Patient was last seen on 04/06/15.  She has been in the hospital for several weeks and has been working towards recovery and building back her stamina.  She would like to proceed by establishing with a new psychiatrist.  Per OV note, her former psychiatrist retired.  Please advise, thank you.  CB#: [phone]

## 2015-06-19 NOTE — Progress Notes (Signed)
06/20/2015 8:13 AM   DOB: [birth year] / MRN: [id number]  SUBJECTIVE:  Anne Black is a 55 y.o. female presenting for a hospital follow up.  The discharge summary advises that she receive a BMP and CBC within one week of discharge.  She reports feeling somewhat better since her stay but feels deconditioned due to the prolonged hospital stay.    She is here for treatment of anxiety.  States that she can't sleep due to diuretics.  She takes risperdal and this does help her calm down.  She is requesting Lorazepam as she has received this from our practice in the past.    She has No Known Allergies.   She  has a past medical history of Multiple sclerosis (HCC); Arthritis; Anxiety; Bipolar disorder (HCC); ADHD (attention deficit hyperactivity disorder); Migraine; and Osteoporosis.    She  reports that she has been smoking Cigarettes.  She has a 15 pack-year smoking history. She has never used smokeless tobacco. She reports that she drinks about 4.8 oz of alcohol per week. She reports that she does not use illicit drugs. She  reports that she does not currently engage in sexual activity. The patient  has past surgical history that includes Cesarean section ([id number]); Myringotomy with tube placement (Bilateral); and Tonsillectomy.  Her family history includes Heart disease in her father and mother; Hypertension in her father.  Review of Systems  Constitutional: Negative for fever and chills.  Eyes: Negative for blurred vision.  Respiratory: Negative for cough and shortness of breath.   Cardiovascular: Negative for chest pain.  Gastrointestinal: Negative for nausea and abdominal pain.  Genitourinary: Negative for dysuria, urgency and frequency.  Musculoskeletal: Negative for myalgias.  Skin: Negative for rash.  Neurological: Negative for dizziness, tingling and headaches.  Psychiatric/Behavioral: Negative for depression. The patient is not nervous/anxious.     Problem list and medications  reviewed and updated by myself where necessary, and exist elsewhere in the encounter.   OBJECTIVE:  BP 110/76 mmHg  Pulse 98  Temp(Src) 98 F (36.7 C) (Oral)  Resp 16  Ht  (1.575 m)  Wt 166 lb 12.8 oz (75.66 kg)  BMI 30.50 kg/m2  SpO2 95%  Physical Exam  Constitutional: No distress.  Jaundiced.  Cardiovascular: Normal rate, regular rhythm and normal heart sounds.   Pulmonary/Chest: Effort normal and breath sounds normal.  Abdominal: Soft. Bowel sounds are normal. She exhibits no distension and no mass. There is no tenderness. There is no rebound and no guarding.  Negative for ascites  Skin: She is not diaphoretic.  Vitals reviewed.   Results for orders placed or performed in visit on 06/19/15 (from the past 72 hour(s))  POCT CBC     Status: Abnormal   Collection Time: 06/19/15  7:04 PM  Result Value Ref Range   WBC 14.4 (A) 4.6 - 10.2 K/uL   Lymph, poc 1.8 0.6 - 3.4   POC LYMPH PERCENT 12.4 10 - 50 %L   MID (cbc) 0.6 0 - 0.9   POC MID % 4.2 0 - 12 %M   POC Granulocyte 12.0 (A) 2 - 6.9   Granulocyte percent 83.4 (A) 37 - 80 %G   RBC 3.86 (A) 4.04 - 5.48 M/uL   Hemoglobin 14.2 12.2 - 16.2 g/dL   HCT, POC 62.9 52.8 - 47.9 %   MCV 105.9 (A) 80 - 97 fL   MCH, POC 36.9 (A) 27 - 31.2 pg   MCHC 34.8 31.8 - 35.4 g/dL  RDW, POC 15.2 %   Platelet Count, POC 187 142 - 424 K/uL   MPV 9.4 0 - 99.8 fL  POCT urinalysis dipstick     Status: Abnormal   Collection Time: 06/19/15  7:06 PM  Result Value Ref Range   Color, UA yellow yellow    Comment: dark yellow   Clarity, UA clear clear   Glucose, UA negative negative   Bilirubin, UA moderate (A) negative   Ketones, POC UA trace (5) (A) negative   Spec Grav, UA 1.010    Blood, UA negative negative   pH, UA 6.0    Protein Ur, POC negative negative   Urobilinogen, UA >=8.0    Nitrite, UA Negative Negative   Leukocytes, UA Negative Negative   CBC Latest Ref Rng 06/19/2015 06/10/2015 06/05/2015  WBC 4.6 - 10.2 K/uL 14.4(A)  12.4(H) 11.4(H)  Hemoglobin 12.2 - 16.2 g/dL 34.7 42.5 11.7(L)  Hematocrit 37.7 - 47.9 % 40.9 36.2 34.8(L)  Platelets 150 - 400 K/uL - 167 133(L)   Lab Results  Component Value Date   HEPAIGM Negative 05/23/2015   HEPBIGM Negative 05/23/2015      No results found.  ASSESSMENT AND PLAN  Anne Black was seen today for multiple sclerosis, anxiety and medication refill.  Diagnoses and all orders for this visit:  Hospital discharge follow-up: She is medically complex.  Her labs and previous documentation all point towards alcoholism.  She denies this. There does appear to be a worsening white count with a granulocytic shift. Her belly exam today is benign and I can not appreciate any ascites.  Her urine is clear on dip from an infection standpoint.    -     POCT CBC -     COMPLETE METABOLIC PANEL WITH GFR -     POCT urinalysis dipstick  Jaundice -     Ammonia -     Hepatitis C antibody  Fungal rash of trunk -     HIV antibody  Anxiety state -     hydrOXYzine (ATARAX/VISTARIL) 10 MG tablet; Take 1 tablet (10 mg total) by mouth 3 (three) times daily as needed.    The patient was advised to call or return to clinic if she does not see an improvement in symptoms or to seek the care of the closest emergency department if she worsens with the above plan.   Anne Black, MHS, PA-C [facility] 06/20/2015 8:13 AM

## 2015-06-19 NOTE — Telephone Encounter (Signed)
Lm, it looks like she is supposed to be seeing Dr. Beverely Low for follow up after hospital visit. She can discuss this with Dr. Beverely Low at her visit. Advised pt to call me back if she needed to.

## 2015-06-19 NOTE — Patient Instructions (Signed)
     IF you received an x-ray today, you will receive an invoice from [facility]. Please contact [facility] at [phone] with questions or concerns regarding your invoice.   IF you received labwork today, you will receive an invoice from [facility]. Please contact [facility] at [phone] with questions or concerns regarding your invoice.   Our billing staff will not be able to assist you with questions regarding bills from these companies.  You will be contacted with the lab results as soon as they are available. The fastest way to get your results is to activate your My Chart account. Instructions are located on the last page of this paperwork. If you have not heard from us regarding the results in 2 weeks, please contact this office.      

## 2015-06-20 ENCOUNTER — Telehealth: Payer: Self-pay

## 2015-06-20 LAB — AMMONIA: AMMONIA: 57 umol/L — AB (ref 16–53)

## 2015-06-20 LAB — COMPLETE METABOLIC PANEL WITH GFR
ALBUMIN: 3.1 g/dL — AB (ref 3.6–5.1)
ALK PHOS: 160 U/L — AB (ref 33–130)
ALT: 36 U/L — ABNORMAL HIGH (ref 6–29)
AST: 66 U/L — ABNORMAL HIGH (ref 10–35)
BILIRUBIN TOTAL: 5.7 mg/dL — AB (ref 0.2–1.2)
BUN: 4 mg/dL — ABNORMAL LOW (ref 7–25)
CALCIUM: 8.2 mg/dL — AB (ref 8.6–10.4)
CO2: 31 mmol/L (ref 20–31)
Chloride: 90 mmol/L — ABNORMAL LOW (ref 98–110)
Creat: 0.55 mg/dL (ref 0.50–1.05)
Glucose, Bld: 81 mg/dL (ref 65–99)
POTASSIUM: 3 mmol/L — AB (ref 3.5–5.3)
Sodium: 134 mmol/L — ABNORMAL LOW (ref 135–146)
TOTAL PROTEIN: 6.4 g/dL (ref 6.1–8.1)

## 2015-06-20 LAB — HIV ANTIBODY (ROUTINE TESTING W REFLEX): HIV 1&2 Ab, 4th Generation: NONREACTIVE

## 2015-06-20 LAB — HEPATITIS C ANTIBODY: HCV Ab: NEGATIVE

## 2015-06-20 NOTE — Telephone Encounter (Signed)
Pt is needing a refill on zofran   Best number [phone]

## 2015-06-21 NOTE — Telephone Encounter (Signed)
I am concerned about prescribing for this patient, having seen her only one time (03/2015 for unrelated complaint), and due to previous non-compliance with recommendations.  She was seen here 5/11 and denied nausea.  She presented to the ED on 5/02 with complaints of nausea, among others, but left AMA.  Will route to the provider who evaluated her most recently to determine appropriateness of this medication.

## 2015-06-23 ENCOUNTER — Telehealth: Payer: Self-pay

## 2015-06-23 NOTE — Telephone Encounter (Signed)
Pt is needing a cough medication called in   Best number [phone]

## 2015-06-23 NOTE — Telephone Encounter (Signed)
Anne Black,  Spoke with patient and she states that has has a terrible dry cough with some chest discomfort. Per pt sxs are the worse at night. Also patient has nausea and would like refill of Zofran. I advise patient per your note, I did not see where the cough was a complaint or mention throughout the note and she was here for a hospital follow up and she would need to be seen given that sxs are new and worsen since her OV. Pt declined an appointment and would like to know if you would send in Levaquin to the pharmacy. Per pt it's too soon for her to come back in for a follow up. Patient was seen on 06/19/15.  Thanks   [employer]

## 2015-06-23 NOTE — Telephone Encounter (Signed)
Please tell her I need to see her.  This cough cough be a pneumonia given she was recently in the hospital.  I need to recheck her white count and get a chest xray.  Deliah Boston, MS, PA-C 12:51 PM, 06/23/2015

## 2015-06-23 NOTE — Telephone Encounter (Signed)
LM informing patient that per Deliah Boston OV is needed for those sxs we discuss earlier

## 2015-06-23 NOTE — Telephone Encounter (Signed)
Pt called after hours on Saturday 06/21/15 for an appt. Called pt this morning to schedule and left VM for a call back.

## 2015-06-24 ENCOUNTER — Telehealth: Payer: Self-pay | Admitting: *Deleted

## 2015-06-24 ENCOUNTER — Telehealth: Payer: Self-pay

## 2015-06-24 NOTE — Telephone Encounter (Signed)
Pt called and wanted a refill on clonazapam.  I asked did she ever make appt with psychiatry.  She stated she never did because they told her it would be about 3 weeks.  I advised pt to call psychiatry Dr. Jennelle Human to see if she can get in, because that is who is going to be taking over refills

## 2015-06-24 NOTE — Telephone Encounter (Signed)
Casimiro Needle,  Pt called inquiring about referral to psychiatrist. Per pt this was discussed at her last OV. Per pt she will try to come in to follow up with you tomorrow or Thursday. Please advise   Thanks   Minahil Quinlivan

## 2015-06-26 NOTE — Telephone Encounter (Signed)
This was done on 06/23/2015

## 2015-06-27 ENCOUNTER — Other Ambulatory Visit: Payer: Self-pay | Admitting: Physician Assistant

## 2015-06-27 DIAGNOSIS — K7460 Unspecified cirrhosis of liver: Secondary | ICD-10-CM

## 2015-06-27 DIAGNOSIS — R7989 Other specified abnormal findings of blood chemistry: Secondary | ICD-10-CM

## 2015-06-27 DIAGNOSIS — F1011 Alcohol abuse, in remission: Secondary | ICD-10-CM

## 2015-06-27 DIAGNOSIS — R17 Unspecified jaundice: Secondary | ICD-10-CM

## 2015-06-27 DIAGNOSIS — R945 Abnormal results of liver function studies: Secondary | ICD-10-CM

## 2015-07-01 ENCOUNTER — Telehealth: Payer: Self-pay | Admitting: Professional Counselor

## 2015-07-01 NOTE — Telephone Encounter (Signed)
Patient mother called inquiring assistance with patient as patient is at home and wanting substance abuse rehabilitation. Mother reports patient has called her asking for services and help. Patient at this time per mother's observations and behaviors :pt is dealing with too acute of medical issues to be admitted into inpatient residential SA. Mother and patient are looking into [facility] as an option.  Discussed with mother to be very honest about her current state with regards to ADLs, mobility, and daily needs. If patient is incontinent, [facility] will not accept as patient has to be able to care for self.  Encouraged mother to have patient follow up with PCP for medical needs in community.  Other options for SA given to mother: [facility], [facility], and [facility] for outpatient treatment.  Mother verbalized understanding. Deretha Emory, MSW Clinical Social Work: [employer] [phone]

## 2015-07-09 ENCOUNTER — Telehealth: Payer: Self-pay

## 2015-07-09 NOTE — Telephone Encounter (Signed)
Patient is calling for Anne Black. She states that she's coughing out of control and would like to see if she could try a steroid medication. Please advise!  [phone]

## 2015-07-11 NOTE — Telephone Encounter (Signed)
Called patient, but when trying to leave VM it ask for me to enter your remote access code then call was disconnected

## 2015-07-16 NOTE — Telephone Encounter (Signed)
Spoke with patient and she is still coughing a lot and would like something like prednisone called in and tessalon pearls.  Patient uses [facility] in [place]. Best call back number [phone].

## 2015-07-17 NOTE — Telephone Encounter (Signed)
Pt advised.

## 2015-07-17 NOTE — Telephone Encounter (Signed)
Per the previous instructions regarding complaint of cough, she needs to be evaluated for this issue. We are concerned that she may have pneumonia or bronchitis. Calling in treatment for this without evaluation is not appropriate.

## 2015-07-25 ENCOUNTER — Other Ambulatory Visit: Payer: Self-pay | Admitting: Physician Assistant

## 2015-07-25 ENCOUNTER — Other Ambulatory Visit: Payer: Self-pay | Admitting: Internal Medicine

## 2015-07-25 ENCOUNTER — Telehealth: Payer: Self-pay

## 2015-07-25 NOTE — Telephone Encounter (Signed)
Pt called back and stated that the risperadal needs to refilled.  She only 2 tablets left and she needs this medication refilled today.  She stated that Casimiro Needle and her spoke about this medication at her last visit which was 06/19/15.  Casimiro Needle please advise on the refill. thanks

## 2015-07-25 NOTE — Telephone Encounter (Signed)
Pt states she and michael clark discussed refills at her visit and she did not get her risperdal refilled  Best number [phone]

## 2015-07-25 NOTE — Telephone Encounter (Signed)
Let's give her 60 days worth of the medication she is requesting. We need to see her back. I had set her up to see a hepatologist however she cancelled the appointment. We need to try and get her back in with that doctor.

## 2015-07-25 NOTE — Telephone Encounter (Signed)
Made 4 attempts to reach patient via the number she left on vm. Line was busy then made effort to contact via cellphone and left vm. Pt attempted to return call; however was not on the line when I picked up. Attempted to call again and patient did not answer. Assuming she is calling regarding refill. Will continue to contact patient.

## 2015-07-26 NOTE — Telephone Encounter (Signed)
I can refill the hydrox.  She needs to come in.  Deliah Boston, MS, PA-C 11:44 AM, 07/26/2015

## 2015-07-26 NOTE — Telephone Encounter (Signed)
Patient states that the hydroxyzine isn't strong enough and she's having panic attacks. She also wants to know if there something she can take for MS to prevent choking.

## 2015-07-29 ENCOUNTER — Ambulatory Visit: Payer: Managed Care, Other (non HMO) | Admitting: Gastroenterology

## 2015-07-29 ENCOUNTER — Telehealth: Payer: Self-pay

## 2015-07-29 NOTE — Telephone Encounter (Signed)
Patient called and stated that Deliah Boston told her that he would give her a direct admit to the hospital if she wanted one--she did not state what it would be for. I was advised to send this directly to the PA Pool and Deliah Boston, her call back number is [phone]. Thank you.

## 2015-07-29 NOTE — Telephone Encounter (Signed)
The patient needs to be seen. Please see previous phone messages. She has been repeatedly told that she needs to be seen for re-evaluation. If she feels that she needs hospital admission, and is unwilling to come in for evaluation, she should go to the emergency department.

## 2015-07-29 NOTE — Telephone Encounter (Signed)
Anne Black advised pt to RTC.

## 2015-07-30 ENCOUNTER — Emergency Department (HOSPITAL_COMMUNITY)
Admission: EM | Admit: 2015-07-30 | Discharge: 2015-07-31 | Disposition: A | Discharge status: Other Acute Inpatient Hospital | Payer: Managed Care, Other (non HMO) | Attending: Emergency Medicine | Admitting: Emergency Medicine

## 2015-07-30 ENCOUNTER — Encounter (HOSPITAL_COMMUNITY): Payer: Self-pay

## 2015-07-30 ENCOUNTER — Emergency Department (HOSPITAL_COMMUNITY)
Admission: EM | Admit: 2015-07-30 | Discharge: 2015-07-30 | Discharge status: Against Medical Advice | Payer: Managed Care, Other (non HMO)

## 2015-07-30 DIAGNOSIS — R259 Unspecified abnormal involuntary movements: Secondary | ICD-10-CM | POA: Diagnosis not present

## 2015-07-30 DIAGNOSIS — E876 Hypokalemia: Secondary | ICD-10-CM | POA: Diagnosis not present

## 2015-07-30 DIAGNOSIS — F17210 Nicotine dependence, cigarettes, uncomplicated: Secondary | ICD-10-CM | POA: Diagnosis not present

## 2015-07-30 DIAGNOSIS — Z79899 Other long term (current) drug therapy: Secondary | ICD-10-CM | POA: Diagnosis not present

## 2015-07-30 DIAGNOSIS — F1020 Alcohol dependence, uncomplicated: Secondary | ICD-10-CM | POA: Diagnosis present

## 2015-07-30 DIAGNOSIS — F3160 Bipolar disorder, current episode mixed, unspecified: Secondary | ICD-10-CM | POA: Diagnosis not present

## 2015-07-30 DIAGNOSIS — F314 Bipolar disorder, current episode depressed, severe, without psychotic features: Secondary | ICD-10-CM

## 2015-07-30 DIAGNOSIS — Z046 Encounter for general psychiatric examination, requested by authority: Secondary | ICD-10-CM

## 2015-07-30 DIAGNOSIS — R45851 Suicidal ideations: Secondary | ICD-10-CM | POA: Diagnosis present

## 2015-07-30 NOTE — ED Notes (Signed)
Per registration staff, the pt requested to have arm band removed and stated she was leaving

## 2015-07-30 NOTE — ED Notes (Signed)
Pt is IVC'd by her daughters because she called them saying that she wanted to die and she was going to drink herself to death

## 2015-07-30 NOTE — ED Provider Notes (Signed)
CSN: [id number]     Arrival date & time 07/30/15  2254 History  By signing my name below, I, Marisue Humble, attest that this documentation has been prepared under the direction and in the presence of non-physician practitioner, Antony Madura, PA-C. Electronically Signed: Marisue Humble, Scribe. 07/30/2015. 11:54 PM.   Chief Complaint  Patient presents with  . Suicidal   The history is provided by the patient. No language interpreter was used.   HPI Comments:  Anne Black is a 55 y.o. female with PMHx of MS, bipolar disorder, and anxiety who presents to the Emergency Department for medical clearance and IVC. Pt checked in earlier tonight, went outside, then called her daughters. Per nursing note, pt told her daughters she wanted to die and drink herself to death. She states it is a pattern that her daughters file IVC paperwork against her. Pt states she does not want to die or drink herself to death. She notes being in pain this morning because she did not take all her medications. She is tearful on exam and expresses depressed mood surrounding MS diagnosis. Pt states she is involved in AA, has a temporary sponsor and discusses her thoughts with them. She had a plan to go to a rehabilitation center in [place], [place] for alcohol abuse. Pt had 2 beers today. Denies drug use. Denies past suicide attempts.   Past Medical History  Diagnosis Date  . Multiple sclerosis (HCC)   . Arthritis   . Anxiety   . Bipolar disorder (HCC)   . ADHD (attention deficit hyperactivity disorder)   . Migraine   . Osteoporosis    Past Surgical History  Procedure Laterality Date  . Cesarean section  [id number]  . Myringotomy with tube placement Bilateral   . Tonsillectomy     Family History  Problem Relation Age of Onset  . Heart disease Mother   . Heart disease Father   . Hypertension Father    Social History  Substance Use Topics  . Smoking status: Current Every Day Smoker -- 0.75 packs/day for 20 years    Types: Cigarettes  . Smokeless tobacco: Never Used     Comment: is cutting back, not in the car, house  . Alcohol Use: 4.8 oz/week    2 Glasses of wine, 2 Cans of beer, 4 Standard drinks or equivalent per week     Comment: "I'm a social drinker." "I drink a glass of wine nightly."   OB History    No data available      Review of Systems  Musculoskeletal: Positive for arthralgias.  Psychiatric/Behavioral: Positive for dysphoric mood. Negative for suicidal ideas.  All other systems reviewed and are negative.   Allergies  Review of patient's allergies indicates no known allergies.  Home Medications   Prior to Admission medications   Medication Sig Start Date End Date Taking? Authorizing Provider  acetaminophen (TYLENOL) 500 MG tablet Take 1,000 mg by mouth every 6 (six) hours as needed for mild pain.   Yes Historical Provider, MD  albuterol (PROVENTIL HFA;VENTOLIN HFA) 108 (90 BASE) MCG/ACT inhaler Inhale 2 puffs into the lungs every 6 (six) hours as needed for wheezing. 02/28/14  Yes Tiffany L Reed, DO  Alum & Mag Hydroxide-Simeth (GI COCKTAIL) SUSP suspension Take 30 mLs by mouth 2 (two) times daily as needed for indigestion. Shake well.   Yes Historical Provider, MD  amantadine (SYMMETREL) 100 MG capsule Take 1 capsule (100 mg total) by mouth 2 (two) times daily. Reported on 02/20/2015  02/21/15  Yes Thermon Leyland, NP  diphenhydrAMINE (BENADRYL) 25 MG tablet Take 50 mg by mouth at bedtime as needed for sleep.    Yes Historical Provider, MD  FLUoxetine (PROZAC) 20 MG capsule Take 1 capsule (20 mg total) by mouth daily. Reported on 02/20/2015 04/15/15  Yes Chelle Jeffery, PA-C  Fluticasone-Salmeterol (ADVAIR) 250-50 MCG/DOSE AEPB Inhale 1 puff into the lungs 2 (two) times daily.   Yes Historical Provider, MD  furosemide (LASIX) 40 MG tablet Take 1 tablet (40 mg total) by mouth 2 (two) times daily. 06/05/15  Yes Catarina Hartshorn, MD  hydrOXYzine (ATARAX/VISTARIL) 10 MG tablet TAKE 1 TABLET(10 MG) BY  MOUTH THREE TIMES DAILY AS NEEDED 07/26/15  Yes Ofilia Neas, PA-C  lidocaine (LIDODERM) 5 % Place 1 patch onto the skin daily as needed (for area underarm for redness/swelling/pain.). Reported on 04/06/2015   Yes Historical Provider, MD  lisdexamfetamine (VYVANSE) 50 MG capsule Take 50 mg by mouth daily.   Yes Historical Provider, MD  meloxicam (MOBIC) 7.5 MG tablet Take 7.5 mg by mouth daily.  03/17/15  Yes Historical Provider, MD  nicotine (NICODERM CQ - DOSED IN MG/24 HOURS) 21 mg/24hr patch Place 1 patch (21 mg total) onto the skin daily. 02/21/15  Yes Thermon Leyland, NP  ondansetron (ZOFRAN-ODT) 4 MG disintegrating tablet DISSOLVE 1 TABLET BY MOUTH EVERY 6 TO 8 HOURS AS NEEDED FOR NAUSEA 06/23/15  Yes Ofilia Neas, PA-C  pantoprazole (PROTONIX) 40 MG tablet Take 1 tablet (40 mg total) by mouth daily. 06/05/15  Yes Catarina Hartshorn, MD  rifaximin (XIFAXAN) 550 MG TABS tablet Take 1 tablet (550 mg total) by mouth 2 (two) times daily. 06/05/15  Yes Catarina Hartshorn, MD  risperiDONE (RISPERDAL) 0.5 MG tablet Take 5 tablets (2.5 mg total) by mouth at bedtime. Patient taking differently: Take 1.5 mg by mouth at bedtime.  02/21/15  Yes Thermon Leyland, NP  rizatriptan (MAXALT) 10 MG tablet Take 10 mg by mouth daily as needed for migraine. Reported on 02/20/2015 09/05/14  Yes Historical Provider, MD  spironolactone (ALDACTONE) 100 MG tablet Take 1 tablet (100 mg total) by mouth 2 (two) times daily. 06/05/15  Yes Catarina Hartshorn, MD  topiramate (TOPAMAX) 100 MG tablet Take 1 tablet (100 mg total) by mouth daily. 02/21/15  Yes Thermon Leyland, NP  fluconazole (DIFLUCAN) 200 MG tablet Take 1 tablet (200 mg total) by mouth daily. Patient not taking: Reported on 07/31/2015 06/06/15   Catarina Hartshorn, MD  lactulose (CHRONULAC) 10 GM/15ML solution Take 15 mLs (10 g total) by mouth 2 (two) times daily. Patient not taking: Reported on 07/31/2015 06/05/15   Catarina Hartshorn, MD  ondansetron (ZOFRAN) 4 MG tablet Take 1 tablet (4 mg total) by mouth every 8  (eight) hours as needed for nausea or vomiting. Patient not taking: Reported on 07/31/2015 04/06/15   Chelle Jeffery, PA-C   BP 108/58 mmHg  Pulse 99  Temp(Src) 97.9 F (36.6 C) (Oral)  Resp 22  SpO2 92%   Physical Exam  Constitutional: She is oriented to person, place, and time. She appears well-developed and well-nourished. No distress.  Nontoxic appearing  HENT:  Head: Normocephalic and atraumatic.  Eyes: Conjunctivae and EOM are normal. No scleral icterus.  Neck: Normal range of motion.  Pulmonary/Chest: Effort normal. No respiratory distress.  Respirations even and unlabored  Musculoskeletal: Normal range of motion.  Neurological: She is alert and oriented to person, place, and time. She exhibits normal muscle tone. Coordination normal.  GCS  15. Ambulatory without assistance.  Skin: Skin is warm and dry. No rash noted. She is not diaphoretic. No erythema. No pallor.  Psychiatric: Her speech is normal. Cognition and memory are normal. She exhibits a depressed mood. She expresses no homicidal and no suicidal ideation. She expresses no suicidal plans and no homicidal plans.  Patient tearful  Nursing note and vitals reviewed.   ED Course  Procedures  DIAGNOSTIC STUDIES:  Oxygen Saturation is 93% on RA, adequate by my interpretation.    COORDINATION OF CARE:  11:51 PM Will order [facility] consult. Discussed treatment plan with pt at bedside and pt agreed to plan.  Labs Review Labs Reviewed  COMPREHENSIVE METABOLIC PANEL - Abnormal; Notable for the following:    Potassium 2.6 (*)    Chloride 93 (*)    Glucose, Bld 114 (*)    BUN <5 (*)    Creatinine, Ser 0.40 (*)    Calcium 8.2 (*)    Albumin 3.4 (*)    AST 111 (*)    ALT 71 (*)    Alkaline Phosphatase 253 (*)    Total Bilirubin 2.1 (*)    All other components within normal limits  ETHANOL - Abnormal; Notable for the following:    Alcohol, Ethyl (B) 196 (*)    All other components within normal limits  ACETAMINOPHEN  LEVEL - Abnormal; Notable for the following:    Acetaminophen (Tylenol), Serum <10 (*)    All other components within normal limits  CBC - Abnormal; Notable for the following:    Platelets 145 (*)    All other components within normal limits  SALICYLATE LEVEL  URINE RAPID DRUG SCREEN, HOSP PERFORMED    Imaging Review No results found.   I have personally reviewed and evaluated these images and lab results as part of my medical decision-making.   EKG Interpretation None      Medications  acetaminophen (TYLENOL) tablet 1,000 mg (not administered)  albuterol (PROVENTIL HFA;VENTOLIN HFA) 108 (90 Base) MCG/ACT inhaler 2 puff (not administered)  gi cocktail (Maalox,Lidocaine,Donnatal) (not administered)  amantadine (SYMMETREL) capsule 100 mg (not administered)  diphenhydrAMINE (BENADRYL) capsule 50 mg (not administered)  FLUoxetine (PROZAC) capsule 20 mg (not administered)  mometasone-formoterol (DULERA) 200-5 MCG/ACT inhaler 2 puff (not administered)  furosemide (LASIX) tablet 40 mg (not administered)  hydrOXYzine (ATARAX/VISTARIL) tablet 10 mg (not administered)  lisdexamfetamine (VYVANSE) capsule 50 mg (not administered)  meloxicam (MOBIC) tablet 7.5 mg (not administered)  nicotine (NICODERM CQ - dosed in mg/24 hours) patch 21 mg (not administered)  ondansetron (ZOFRAN-ODT) disintegrating tablet 4 mg (not administered)  pantoprazole (PROTONIX) EC tablet 40 mg (not administered)  rifaximin (XIFAXAN) tablet 550 mg (not administered)  risperiDONE (RISPERDAL) tablet 1.5 mg (not administered)  spironolactone (ALDACTONE) tablet 100 mg (not administered)  topiramate (TOPAMAX) tablet 100 mg (not administered)  potassium chloride 10 mEq in 100 mL IVPB (0 mEq Intravenous Stopped 07/31/15 0201)  potassium chloride SA (K-DUR,KLOR-CON) CR tablet 40 mEq (40 mEq Oral Given 07/31/15 0101)  acetaminophen (TYLENOL) tablet 650 mg (650 mg Oral Given 07/31/15 0145)  LORazepam (ATIVAN) tablet 1 mg (1  mg Oral Given 07/31/15 0353)    MDM   Final diagnoses:  Involuntary commitment  Bipolar affective disorder, current episode mixed, current episode severity unspecified (HCC)  Hypokalemia    55 year old female with a history of bipolar disorder presents to the emergency department for psychiatric evaluation. IVC papers taken out by daughter. Patient was previously in the emergency department, but left prior to  being seen. She returned after IVC papers were taken out. Patient denies any suicidal thoughts during my encounter with her. She is pending psychiatric evaluation and recommendations for disposition. The patient has been medically cleared in the emergency department. She does have abnormal LFTs, but a history of cirrhosis. These labs seem consistent with her prior evaluations. Hypokalemia repleted with IV and oral potassium.  Disposition to be determined by oncoming ED provider. Daily medications ordered to be given until disposition set.  I personally performed the services described in this documentation, which was scribed in my presence. The recorded information has been reviewed and is accurate.    Filed Vitals:   07/30/15 2335 07/31/15 0545  BP: 127/85 108/58  Pulse: 95 99  Temp: 97.5 F (36.4 C) 97.9 F (36.6 C)  TempSrc: Oral Oral  Resp: 17 22  SpO2: 93% 92%     Antony Madura, PA-C 07/31/15 9604  Derwood Kaplan, MD 08/05/15 1055

## 2015-07-30 NOTE — ED Notes (Signed)
MD at bedside. 

## 2015-07-31 ENCOUNTER — Inpatient Hospital Stay (HOSPITAL_COMMUNITY)
Admission: AD | Admit: 2015-07-31 | Discharge: 2015-08-04 | DRG: 885 | Disposition: A | Discharge status: Home / Self Care | Payer: Managed Care, Other (non HMO) | Attending: Emergency Medicine | Admitting: Emergency Medicine

## 2015-07-31 ENCOUNTER — Encounter (HOSPITAL_COMMUNITY): Payer: Self-pay

## 2015-07-31 DIAGNOSIS — G35 Multiple sclerosis: Secondary | ICD-10-CM | POA: Diagnosis present

## 2015-07-31 DIAGNOSIS — F411 Generalized anxiety disorder: Secondary | ICD-10-CM

## 2015-07-31 DIAGNOSIS — F3162 Bipolar disorder, current episode mixed, moderate: Principal | ICD-10-CM | POA: Diagnosis present

## 2015-07-31 DIAGNOSIS — Y906 Blood alcohol level of 120-199 mg/100 ml: Secondary | ICD-10-CM | POA: Diagnosis present

## 2015-07-31 DIAGNOSIS — E876 Hypokalemia: Secondary | ICD-10-CM | POA: Diagnosis present

## 2015-07-31 DIAGNOSIS — F17210 Nicotine dependence, cigarettes, uncomplicated: Secondary | ICD-10-CM | POA: Diagnosis present

## 2015-07-31 DIAGNOSIS — F314 Bipolar disorder, current episode depressed, severe, without psychotic features: Secondary | ICD-10-CM | POA: Diagnosis present

## 2015-07-31 DIAGNOSIS — Z9181 History of falling: Secondary | ICD-10-CM | POA: Diagnosis not present

## 2015-07-31 DIAGNOSIS — G8929 Other chronic pain: Secondary | ICD-10-CM | POA: Diagnosis present

## 2015-07-31 DIAGNOSIS — F1020 Alcohol dependence, uncomplicated: Secondary | ICD-10-CM | POA: Diagnosis present

## 2015-07-31 DIAGNOSIS — K219 Gastro-esophageal reflux disease without esophagitis: Secondary | ICD-10-CM | POA: Diagnosis present

## 2015-07-31 DIAGNOSIS — K7030 Alcoholic cirrhosis of liver without ascites: Secondary | ICD-10-CM | POA: Diagnosis present

## 2015-07-31 DIAGNOSIS — F319 Bipolar disorder, unspecified: Secondary | ICD-10-CM | POA: Diagnosis not present

## 2015-07-31 DIAGNOSIS — F3160 Bipolar disorder, current episode mixed, unspecified: Secondary | ICD-10-CM | POA: Diagnosis not present

## 2015-07-31 LAB — COMPREHENSIVE METABOLIC PANEL
ALT: 71 U/L — ABNORMAL HIGH (ref 14–54)
AST: 111 U/L — AB (ref 15–41)
Albumin: 3.4 g/dL — ABNORMAL LOW (ref 3.5–5.0)
Alkaline Phosphatase: 253 U/L — ABNORMAL HIGH (ref 38–126)
Anion gap: 11 (ref 5–15)
BILIRUBIN TOTAL: 2.1 mg/dL — AB (ref 0.3–1.2)
CO2: 31 mmol/L (ref 22–32)
CREATININE: 0.4 mg/dL — AB (ref 0.44–1.00)
Calcium: 8.2 mg/dL — ABNORMAL LOW (ref 8.9–10.3)
Chloride: 93 mmol/L — ABNORMAL LOW (ref 101–111)
Glucose, Bld: 114 mg/dL — ABNORMAL HIGH (ref 65–99)
POTASSIUM: 2.6 mmol/L — AB (ref 3.5–5.1)
Sodium: 135 mmol/L (ref 135–145)
TOTAL PROTEIN: 6.6 g/dL (ref 6.5–8.1)

## 2015-07-31 LAB — ETHANOL: ALCOHOL ETHYL (B): 196 mg/dL — AB (ref <5)

## 2015-07-31 LAB — RAPID URINE DRUG SCREEN, HOSP PERFORMED
AMPHETAMINES: NOT DETECTED
BARBITURATES: NOT DETECTED
BENZODIAZEPINES: NOT DETECTED
Cocaine: NOT DETECTED
Opiates: NOT DETECTED
Tetrahydrocannabinol: NOT DETECTED

## 2015-07-31 LAB — SALICYLATE LEVEL

## 2015-07-31 LAB — CBC
HCT: 38.8 % (ref 36.0–46.0)
Hemoglobin: 13.2 g/dL (ref 12.0–15.0)
MCH: 34 pg (ref 26.0–34.0)
MCHC: 34 g/dL (ref 30.0–36.0)
MCV: 100 fL (ref 78.0–100.0)
Platelets: 145 10*3/uL — ABNORMAL LOW (ref 150–400)
RBC: 3.88 MIL/uL (ref 3.87–5.11)
RDW: 15.3 % (ref 11.5–15.5)
WBC: 9.5 10*3/uL (ref 4.0–10.5)

## 2015-07-31 LAB — ACETAMINOPHEN LEVEL: Acetaminophen (Tylenol), Serum: <10 ug/mL — ABNORMAL LOW (ref 10–30)

## 2015-07-31 MED ORDER — DIPHENHYDRAMINE HCL 25 MG PO CAPS: 50.0000 mg | ORAL_CAPSULE | Freq: Every evening | ORAL | Status: DC | PRN

## 2015-07-31 MED ORDER — ALBUTEROL SULFATE HFA 108 (90 BASE) MCG/ACT IN AERS: 2.0000 | INHALATION_SPRAY | Freq: Four times a day (QID) | RESPIRATORY_TRACT | Status: DC | PRN

## 2015-07-31 MED ORDER — TOPIRAMATE 100 MG PO TABS: 100.0000 mg | ORAL_TABLET | Freq: Every day | ORAL | Status: DC

## 2015-07-31 MED ORDER — ACETAMINOPHEN 500 MG PO TABS: 1000.0000 mg | ORAL_TABLET | Freq: Four times a day (QID) | ORAL | Status: DC | PRN

## 2015-07-31 MED ORDER — LORAZEPAM 1 MG PO TABS: 0.0000 mg | ORAL_TABLET | Freq: Two times a day (BID) | ORAL | Status: DC

## 2015-07-31 MED ORDER — AMANTADINE HCL 100 MG PO CAPS
100.0000 mg | ORAL_CAPSULE | Freq: Two times a day (BID) | ORAL | Status: DC
  Administered 2015-07-31 – 2015-08-04 (×8): 100 mg via ORAL
  Filled 2015-07-31 (×14): qty 1

## 2015-07-31 MED ORDER — RISPERIDONE 0.5 MG PO TABS: 1.5000 mg | ORAL_TABLET | Freq: Every day | ORAL | Status: DC

## 2015-07-31 MED ORDER — GABAPENTIN 100 MG PO CAPS
200.0000 mg | ORAL_CAPSULE | Freq: Three times a day (TID) | ORAL | Status: DC
  Administered 2015-07-31: 200 mg via ORAL
  Filled 2015-07-31: qty 2

## 2015-07-31 MED ORDER — VITAMIN B-1 100 MG PO TABS
100.0000 mg | ORAL_TABLET | Freq: Every day | ORAL | Status: DC
  Administered 2015-08-01 – 2015-08-04 (×4): 100 mg via ORAL
  Filled 2015-07-31 (×7): qty 1

## 2015-07-31 MED ORDER — ADULT MULTIVITAMIN W/MINERALS CH
1.0000 | ORAL_TABLET | Freq: Every day | ORAL | Status: DC
  Administered 2015-07-31: 1 via ORAL
  Filled 2015-07-31: qty 1

## 2015-07-31 MED ORDER — FLUOXETINE HCL 20 MG PO CAPS
20.0000 mg | ORAL_CAPSULE | Freq: Every day | ORAL | Status: DC
  Administered 2015-07-31: 20 mg via ORAL
  Filled 2015-07-31: qty 1

## 2015-07-31 MED ORDER — PANTOPRAZOLE SODIUM 40 MG PO TBEC
40.0000 mg | DELAYED_RELEASE_TABLET | Freq: Every day | ORAL | Status: DC
Start: 2015-07-31 — End: 2015-07-31
  Administered 2015-07-31: 40 mg via ORAL
  Filled 2015-07-31: qty 1

## 2015-07-31 MED ORDER — POTASSIUM CHLORIDE 10 MEQ/100ML IV SOLN
10.0000 meq | Freq: Once | INTRAVENOUS | Status: AC
  Administered 2015-07-31: 10 meq via INTRAVENOUS
  Filled 2015-07-31: qty 100

## 2015-07-31 MED ORDER — LORAZEPAM 1 MG PO TABS
1.0000 mg | ORAL_TABLET | Freq: Once | ORAL | Status: AC
  Administered 2015-07-31: 1 mg via ORAL
  Filled 2015-07-31: qty 1

## 2015-07-31 MED ORDER — FUROSEMIDE 40 MG PO TABS
40.0000 mg | ORAL_TABLET | Freq: Two times a day (BID) | ORAL | Status: DC
  Filled 2015-07-31 (×4): qty 1
  Filled 2015-07-31: qty 2
  Filled 2015-07-31 (×7): qty 1

## 2015-07-31 MED ORDER — NICOTINE 21 MG/24HR TD PT24
21.0000 mg | MEDICATED_PATCH | Freq: Every day | TRANSDERMAL | Status: DC
  Administered 2015-08-01 – 2015-08-04 (×4): 21 mg via TRANSDERMAL
  Filled 2015-07-31 (×7): qty 1

## 2015-07-31 MED ORDER — AMANTADINE HCL 100 MG PO CAPS
100.0000 mg | ORAL_CAPSULE | Freq: Two times a day (BID) | ORAL | Status: DC
  Administered 2015-07-31: 100 mg via ORAL
  Filled 2015-07-31 (×2): qty 1

## 2015-07-31 MED ORDER — LORAZEPAM 1 MG PO TABS
1.0000 mg | ORAL_TABLET | Freq: Four times a day (QID) | ORAL | Status: DC | PRN
  Administered 2015-07-31: 1 mg via ORAL
  Filled 2015-07-31: qty 1

## 2015-07-31 MED ORDER — GABAPENTIN 100 MG PO CAPS: 200.0000 mg | ORAL_CAPSULE | Freq: Three times a day (TID) | ORAL | Status: DC

## 2015-07-31 MED ORDER — LORAZEPAM 1 MG PO TABS
1.0000 mg | ORAL_TABLET | Freq: Four times a day (QID) | ORAL | Status: AC | PRN
  Administered 2015-07-31 – 2015-08-03 (×6): 1 mg via ORAL
  Filled 2015-07-31 (×7): qty 1

## 2015-07-31 MED ORDER — LOPERAMIDE HCL 2 MG PO CAPS: 2.0000 mg | ORAL_CAPSULE | ORAL | Status: DC | PRN

## 2015-07-31 MED ORDER — HYDROXYZINE HCL 25 MG PO TABS
25.0000 mg | ORAL_TABLET | Freq: Four times a day (QID) | ORAL | Status: AC | PRN
  Filled 2015-07-31: qty 1

## 2015-07-31 MED ORDER — ACETAMINOPHEN 325 MG PO TABS
650.0000 mg | ORAL_TABLET | Freq: Four times a day (QID) | ORAL | Status: DC | PRN
  Administered 2015-07-31 – 2015-08-04 (×3): 650 mg via ORAL
  Filled 2015-07-31 (×3): qty 2

## 2015-07-31 MED ORDER — NICOTINE 21 MG/24HR TD PT24
21.0000 mg | MEDICATED_PATCH | Freq: Every day | TRANSDERMAL | Status: DC
  Administered 2015-07-31: 21 mg via TRANSDERMAL
  Filled 2015-07-31: qty 1

## 2015-07-31 MED ORDER — HYDROXYZINE HCL 25 MG PO TABS: 25.0000 mg | ORAL_TABLET | Freq: Four times a day (QID) | ORAL | Status: DC | PRN

## 2015-07-31 MED ORDER — MAGNESIUM HYDROXIDE 400 MG/5ML PO SUSP: 30.0000 mL | Freq: Every day | ORAL | Status: DC | PRN

## 2015-07-31 MED ORDER — MOMETASONE FURO-FORMOTEROL FUM 200-5 MCG/ACT IN AERO
2.0000 | INHALATION_SPRAY | Freq: Two times a day (BID) | RESPIRATORY_TRACT | Status: DC
  Administered 2015-07-31: 2 via RESPIRATORY_TRACT
  Filled 2015-07-31: qty 8.8

## 2015-07-31 MED ORDER — ALUM & MAG HYDROXIDE-SIMETH 200-200-20 MG/5ML PO SUSP: 30.0000 mL | ORAL | Status: DC | PRN

## 2015-07-31 MED ORDER — SPIRONOLACTONE 100 MG PO TABS
100.0000 mg | ORAL_TABLET | Freq: Two times a day (BID) | ORAL | Status: DC
  Administered 2015-07-31: 100 mg via ORAL
  Filled 2015-07-31 (×3): qty 1

## 2015-07-31 MED ORDER — ACETAMINOPHEN 325 MG PO TABS
650.0000 mg | ORAL_TABLET | Freq: Once | ORAL | Status: AC
  Administered 2015-07-31: 650 mg via ORAL
  Filled 2015-07-31: qty 2

## 2015-07-31 MED ORDER — ONDANSETRON 4 MG PO TBDP: 4.0000 mg | ORAL_TABLET | Freq: Four times a day (QID) | ORAL | Status: DC | PRN

## 2015-07-31 MED ORDER — LOPERAMIDE HCL 2 MG PO CAPS: 2.0000 mg | ORAL_CAPSULE | ORAL | Status: AC | PRN

## 2015-07-31 MED ORDER — TOPIRAMATE 100 MG PO TABS
100.0000 mg | ORAL_TABLET | Freq: Every day | ORAL | Status: DC
  Administered 2015-08-01 – 2015-08-03 (×3): 100 mg via ORAL
  Filled 2015-07-31 (×7): qty 1

## 2015-07-31 MED ORDER — VITAMIN B-1 100 MG PO TABS
100.0000 mg | ORAL_TABLET | Freq: Every day | ORAL | Status: DC
  Administered 2015-07-31: 100 mg via ORAL
  Filled 2015-07-31: qty 1

## 2015-07-31 MED ORDER — TOPIRAMATE 100 MG PO TABS
100.0000 mg | ORAL_TABLET | Freq: Every day | ORAL | Status: DC
  Administered 2015-07-31: 100 mg via ORAL
  Filled 2015-07-31: qty 1

## 2015-07-31 MED ORDER — GABAPENTIN 100 MG PO CAPS
200.0000 mg | ORAL_CAPSULE | Freq: Three times a day (TID) | ORAL | Status: DC
  Administered 2015-07-31 – 2015-08-04 (×10): 200 mg via ORAL
  Filled 2015-07-31 (×18): qty 2

## 2015-07-31 MED ORDER — ADULT MULTIVITAMIN W/MINERALS CH
1.0000 | ORAL_TABLET | Freq: Every day | ORAL | Status: DC
  Administered 2015-08-01 – 2015-08-04 (×4): 1 via ORAL
  Filled 2015-07-31 (×7): qty 1

## 2015-07-31 MED ORDER — POTASSIUM CHLORIDE CRYS ER 20 MEQ PO TBCR
40.0000 meq | EXTENDED_RELEASE_TABLET | Freq: Once | ORAL | Status: AC
  Administered 2015-07-31: 40 meq via ORAL
  Filled 2015-07-31: qty 2

## 2015-07-31 MED ORDER — MELOXICAM 7.5 MG PO TABS
7.5000 mg | ORAL_TABLET | Freq: Every day | ORAL | Status: DC
  Administered 2015-07-31: 7.5 mg via ORAL
  Filled 2015-07-31: qty 1

## 2015-07-31 MED ORDER — MELOXICAM 7.5 MG PO TABS
7.5000 mg | ORAL_TABLET | Freq: Every day | ORAL | Status: DC
  Administered 2015-08-01 – 2015-08-04 (×4): 7.5 mg via ORAL
  Filled 2015-07-31 (×7): qty 1

## 2015-07-31 MED ORDER — PANTOPRAZOLE SODIUM 40 MG PO TBEC
40.0000 mg | DELAYED_RELEASE_TABLET | Freq: Every day | ORAL | Status: DC
  Administered 2015-08-01 – 2015-08-04 (×4): 40 mg via ORAL
  Filled 2015-07-31 (×8): qty 1

## 2015-07-31 MED ORDER — ONDANSETRON 4 MG PO TBDP: 4.0000 mg | ORAL_TABLET | Freq: Four times a day (QID) | ORAL | Status: AC | PRN

## 2015-07-31 MED ORDER — FLUOXETINE HCL 20 MG PO CAPS
20.0000 mg | ORAL_CAPSULE | Freq: Every day | ORAL | Status: DC
  Administered 2015-08-01 – 2015-08-04 (×4): 20 mg via ORAL
  Filled 2015-07-31 (×8): qty 1

## 2015-07-31 MED ORDER — GI COCKTAIL ~~LOC~~
30.0000 mL | Freq: Two times a day (BID) | ORAL | Status: DC | PRN
  Filled 2015-07-31 (×2): qty 30

## 2015-07-31 MED ORDER — LISDEXAMFETAMINE DIMESYLATE 50 MG PO CAPS
50.0000 mg | ORAL_CAPSULE | Freq: Every day | ORAL | Status: DC
  Administered 2015-07-31: 50 mg via ORAL
  Filled 2015-07-31: qty 1

## 2015-07-31 MED ORDER — SPIRONOLACTONE 100 MG PO TABS
100.0000 mg | ORAL_TABLET | Freq: Two times a day (BID) | ORAL | Status: DC
  Administered 2015-08-02 – 2015-08-04 (×5): 100 mg via ORAL
  Filled 2015-07-31 (×12): qty 1

## 2015-07-31 MED ORDER — DIPHENHYDRAMINE HCL 25 MG PO CAPS
50.0000 mg | ORAL_CAPSULE | Freq: Every evening | ORAL | Status: DC | PRN
  Administered 2015-07-31 – 2015-08-03 (×3): 50 mg via ORAL
  Filled 2015-07-31 (×4): qty 2

## 2015-07-31 MED ORDER — RIFAXIMIN 550 MG PO TABS
550.0000 mg | ORAL_TABLET | Freq: Two times a day (BID) | ORAL | Status: DC
  Administered 2015-08-01 – 2015-08-04 (×6): 550 mg via ORAL
  Filled 2015-07-31 (×12): qty 1

## 2015-07-31 MED ORDER — HYDROXYZINE HCL 10 MG PO TABS
10.0000 mg | ORAL_TABLET | Freq: Three times a day (TID) | ORAL | Status: DC | PRN
  Administered 2015-07-31: 10 mg via ORAL
  Filled 2015-07-31: qty 1

## 2015-07-31 MED ORDER — LORAZEPAM 1 MG PO TABS: 0.0000 mg | ORAL_TABLET | Freq: Four times a day (QID) | ORAL | Status: DC

## 2015-07-31 MED ORDER — FUROSEMIDE 40 MG PO TABS: 40.0000 mg | ORAL_TABLET | Freq: Two times a day (BID) | ORAL | Status: DC

## 2015-07-31 MED ORDER — THIAMINE HCL 100 MG/ML IJ SOLN: 100.0000 mg | Freq: Once | INTRAMUSCULAR | Status: DC

## 2015-07-31 MED ORDER — ONDANSETRON 4 MG PO TBDP
4.0000 mg | ORAL_TABLET | Freq: Three times a day (TID) | ORAL | Status: DC | PRN
  Administered 2015-07-31: 4 mg via ORAL
  Filled 2015-07-31: qty 1

## 2015-07-31 MED ORDER — RIFAXIMIN 550 MG PO TABS
550.0000 mg | ORAL_TABLET | Freq: Two times a day (BID) | ORAL | Status: DC
  Administered 2015-07-31: 550 mg via ORAL
  Filled 2015-07-31 (×2): qty 1

## 2015-07-31 MED ORDER — GI COCKTAIL ~~LOC~~: 30.0000 mL | Freq: Two times a day (BID) | ORAL | Status: DC | PRN

## 2015-07-31 MED ORDER — MOMETASONE FURO-FORMOTEROL FUM 200-5 MCG/ACT IN AERO
2.0000 | INHALATION_SPRAY | Freq: Two times a day (BID) | RESPIRATORY_TRACT | Status: DC
  Administered 2015-07-31 – 2015-08-04 (×7): 2 via RESPIRATORY_TRACT
  Filled 2015-07-31 (×3): qty 8.8

## 2015-07-31 NOTE — ED Notes (Signed)
Pt reports she is anxious and would like Benadryl. Pt was made aware of order parameters to give at bedtime. Pt states she understands and wants to know if there was any alcohol in her blood test and when all her daily medications will be due. medications schedule discussed with patient. Understands, cooperative and calm.

## 2015-07-31 NOTE — ED Notes (Signed)
Pt states that her daughters have a pattern of telling people that she is suicidal. She states that it is her choice to drink if she wants to. She states that she is not suicidal.

## 2015-07-31 NOTE — ED Notes (Signed)
[employer] at bedside for transport   Pt ambulated to bathroom w/o difficulty. Request a wheelchair out to their car.

## 2015-07-31 NOTE — ED Notes (Signed)
Bed: WA08 Expected date:  Expected time:  Means of arrival:  Comments: 

## 2015-07-31 NOTE — ED Notes (Signed)
Bed: WA07 Expected date:  Expected time:  Means of arrival:  Comments: 

## 2015-07-31 NOTE — BH Assessment (Signed)
[facility] Assessment Progress Note  Per Anne Mins, MD, this pt requires psychiatric hospitalization at this time.  Anne Heinrich, RN, [facility] has assigned pt to [facility] Rm 304-2.  Pt presents under IVC which Dr Jannifer Franklin has upheld, and IVC documents have been faxed to [facility].  Pt's nurse, Merry Proud, has been notified, and agrees to call report to [phone].  Pt is to be transported via [employer].  Doylene Canning, MA Triage Specialist [phone]
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
## 2015-07-31 NOTE — Tx Team (Signed)
Initial Interdisciplinary Treatment Plan   PATIENT STRESSORS: Health problems Marital or family conflict Substance abuse   PATIENT STRENGTHS: Manufacturing systems engineer Motivation for treatment/growth   PROBLEM LIST: Problem List/Patient Goals Date to be addressed Date deferred Reason deferred Estimated date of resolution  Depression 07/31/15     Substance abuse 07/31/15     Anxiety  07/31/15     "stop drinking" 07/31/15     "Come up with a plan of action on how I'm going to deta                               DISCHARGE CRITERIA:  Improved stabilization in mood, thinking, and/or behavior Verbal commitment to aftercare and medication compliance  PRELIMINARY DISCHARGE PLAN: Outpatient therapy Medication management  PATIENT/FAMIILY INVOLVEMENT: This treatment plan has been presented to and reviewed with the patient, Anne Black.  The patient and family have been given the opportunity to ask questions and make suggestions.  Levin Bacon 07/31/2015, 6:44 PM

## 2015-07-31 NOTE — Progress Notes (Signed)
Patient ID: Anne Black, female   DOB: 1960-12-01, 55 y.o.   MRN: [id number] PER STATE REGULATIONS 482.30  THIS CHART WAS REVIEWED FOR MEDICAL NECESSITY WITH RESPECT TO THE PATIENT'S ADMISSION/DURATION OF STAY.  NEXT REVIEW DATE:08/04/15  Loura Halt, RN, BSN CASE MANAGER

## 2015-07-31 NOTE — Progress Notes (Signed)
Patient did not attend the evening karaoke group. Pt was notified that group was beginning and remained in bed.

## 2015-07-31 NOTE — ED Notes (Signed)
Request for Transport by [employer] called.

## 2015-08-01 ENCOUNTER — Encounter (HOSPITAL_COMMUNITY): Payer: Self-pay | Admitting: Psychiatry

## 2015-08-01 DIAGNOSIS — F3162 Bipolar disorder, current episode mixed, moderate: Principal | ICD-10-CM

## 2015-08-01 DIAGNOSIS — F1020 Alcohol dependence, uncomplicated: Secondary | ICD-10-CM

## 2015-08-01 MED ORDER — POTASSIUM CHLORIDE CRYS ER 20 MEQ PO TBCR
20.0000 meq | EXTENDED_RELEASE_TABLET | Freq: Two times a day (BID) | ORAL | Status: AC
  Administered 2015-08-01 – 2015-08-03 (×4): 20 meq via ORAL
  Filled 2015-08-01 (×5): qty 1

## 2015-08-01 MED ORDER — RISPERIDONE 0.5 MG PO TABS
0.5000 mg | ORAL_TABLET | Freq: Every day | ORAL | Status: DC
  Administered 2015-08-01 – 2015-08-03 (×3): 0.5 mg via ORAL
  Filled 2015-08-01 (×6): qty 1

## 2015-08-01 NOTE — Progress Notes (Signed)
Patient has appointment for MR Brain and MR Cervical Spine on Saturday August 02, 2015 at 1:45 p.m.  [facility], [address], [place], [place] (inside the [facility] building beside [facility]).  Patient is involuntary and needs to be taken by [employer].  Nurse called [employer] today phone [phone] and spoke with Operator Earlene Plater who told nurse to have someone call them one hr ahead of time and tell [employer] that they need to pick up this patient and transport.  Nurse informed charge nurse today and also 2nd shift nurse.

## 2015-08-01 NOTE — Progress Notes (Signed)
D.  Pt pleasant on approach, but anxious about where she will go after the brain and cervical scan she is to have tomorrow.  Pt is hopeful for discharge at that time so that she can get her car and get gas in it for Monday.  Pt states she has an early appointment on Monday with her neurologist.  Pt denies SI/HI/hallucinations at this time.  Pt observed interacting appropriately with peers on the unit.  Pt was positive for evening AA group.  A.  Support and encouragement offered, medication given as ordered.  R. Pt remains safe on the unit, will continue to monitor.
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
## 2015-08-01 NOTE — Progress Notes (Signed)
CSW spoke with pt individually. Pt continues to request discharge. CSW explained that pt has appt to go to hospital tomorrow for more tests. CSW contacted Aggie Cosier at [facility] [place]. Pt has been accepted BUT MUST FIND HER OWN TRANSPORTATION TO FACILITY DUE TO FACILITY TRYING TO PICK HER UP 3X IN THE PAST AND SHE WOULD NOT COME TO THE DOOR.  Pt aware of this. Pt also unsure of what she wants to do from here. She stated that she was calling her neurologist to make follow-up appt "because I really need all that done." Pt stated that she may still be interested in [id number] [facility] [place] but may move to [place]. She appears very unclear about plan. Pt stated that she has appt with Neurologist at 8:20AM on Monday. Pt spoke with May Augustin NP about this as well. Pt wants to d/c Saturday or Sunday (more likely Sunday) in order to make this appt.  Trula Slade, MSW, LCSW Clinical Social Worker 08/01/2015 2:33 PM

## 2015-08-01 NOTE — BHH Suicide Risk Assessment (Signed)
[facility] Admission Suicide Risk Assessment   Nursing information obtained from:    Demographic factors:   Patient is a 55 yo caucasian woman. Current Mental Status:   Alert and oriented to 4. Speech normal in rate and volume. Mood depressed, affect constricted. Limited insighta nd judgement. Denies suicidal and homicidal thoughts. Loss Factors:   loss of job Historical Factors:   non compliance with MS treatment Risk Reduction Factors:   family support  Total Time spent with patient: 15 min Principal Problem: Bipolar disorder, current episode mixed, moderate (HCC) Diagnosis:   Patient Active Problem List   Diagnosis Date Noted  . Bipolar disorder, current episode mixed, moderate (HCC) [F31.62] 08/01/2015  . Alcohol use disorder, severe, dependence (HCC) [F10.20] 07/31/2015  . Alcoholic cirrhosis (HCC) [K70.30] 05/31/2015  . Alcoholic hepatitis with ascites [K70.11] 05/31/2015  . Macrocytic anemia- due to alcohol abuse with normal B12 & folate levels [D53.9] 05/31/2015  . Severe protein-calorie malnutrition (HCC) [E43] 05/31/2015  . Hypokalemia [E87.6] 05/26/2015  . Encephalopathy, hepatic (HCC) [K72.90] 05/26/2015  . Anxiety [F41.9] 05/18/2015  . Sepsis (HCC) [A41.9] 05/18/2015  . UTI (lower urinary tract infection) [N39.0] 05/18/2015  . Stimulant abuse [F15.10] 12/27/2014  . Nicotine abuse [F19.10] 12/27/2014  . Hyperprolactinemia (HCC) [E22.1] 11/15/2014  . Dyslipidemia [E78.5]   . Noncompliance with therapeutic plan [Z91.11] 04/04/2013  . Unspecified hereditary and idiopathic peripheral neuropathy [G60.9] 05/01/2012  . GERD (gastroesophageal reflux disease) [K21.9] 05/01/2012  . Osteoarthrosis, unspecified whether generalized or localized, involving lower leg [M17.9] 05/01/2012  . MS (multiple sclerosis) (HCC) [G35] 09/14/2011   Subjective Data: Patient is a 55 yo woman with alcohol dependence and presenting with suicidal thoughts.  Continued Clinical Symptoms:  Alcohol Use  Disorder Identification Test Final Score (AUDIT): 15 The "Alcohol Use Disorders Identification Test", Guidelines for Use in Primary Care, Second Edition.  World Science writer [facility]). Score between 0-7:  no or low risk or alcohol related problems. Score between 8-15:  moderate risk of alcohol related problems. Score between 16-19:  high risk of alcohol related problems. Score 20 or above:  warrants further diagnostic evaluation for alcohol dependence and treatment.   CLINICAL FACTORS:   Depression:   Anhedonia Comorbid alcohol abuse/dependence Hopelessness Impulsivity Insomnia Severe   Musculoskeletal: Strength & Muscle Tone: within normal limits Gait & Station: normal Patient leans: N/A  Psychiatric Specialty Exam: Physical Exam  ROS  Blood pressure 116/86, pulse 95, temperature 98.5 F (36.9 C), temperature source Oral, resp. rate 16, height 5\' 1"  (1.549 m), weight 193 lb (87.544 kg), SpO2 96 %.Body mass index is 36.49 kg/(m^2).  General Appearance: Casual  Eye Contact: Fair  Speech: Normal Rate  Volume: Decreased  Mood: Anxious  Affect: Labile  Thought Process: Goal Directed  Orientation: Full (Time, Place, and Person)  Thought Content: Paranoid Ideation and Rumination  Suicidal Thoughts: No  Homicidal Thoughts: No  Memory: Immediate; Fair Recent; Fair Remote; Fair  Judgement: Poor  Insight: Lacking  Psychomotor Activity: Restlessness and Tremor  Concentration: Concentration: Fair and Attention Span: Fair  Recall: [employer] of Knowledge: Fair  Language: Good  Akathisia: Negative  Handed: Right  AIMS (if indicated):    Assets: Communication Skills Desire for Improvement Resilience  ADL's: Intact  Cognition: WNL  Sleep: Number of Hours: 6             COGNITIVE FEATURES THAT CONTRIBUTE TO RISK:  Closed-mindedness and Thought constriction (tunnel vision)    SUICIDE RISK:   Moderate:   Frequent suicidal ideation  with limited intensity, and duration, some specificity in terms of plans, no associated intent, good self-control, limited dysphoria/symptomatology, some risk factors present, and identifiable protective factors, including available and accessible social support.  PLAN OF CARE:  Treatment Plan Summary: Admit for crisis management and mood stabilization. Medication management to re-stabilize current mood symptoms Group counseling sessions for coping skills Medical consults as needed Review and reinstate any pertinent home medications for other health problems  Observation Level/Precautions: 15 minute checks  Laboratory: per ED  Psychotherapy: group  Medications: As per medlist, cont home meds, Prozac 20 mg QD depression, Gabapentin 200 mg BID anxiety, Hydroxyzine   Consultations: As needed  Discharge Concerns: safety  Estimated LOS: 2-7 days  Other: MRI neck and brain w/wo contrast for Mult Sclerosis         I certify that inpatient services furnished can reasonably be expected to improve the patient's condition.   Patrick North, MD 08/01/2015, 4:05 PM

## 2015-08-01 NOTE — Progress Notes (Addendum)
  [facility] Adult Case Management Discharge Plan :  Will you be returning to the same living situation after discharge:  Yes,  home At discharge, do you have transportation home?: Yes, friend/family member (TENTATIVE DISCHARGE FOR Sunday DUE TO Monday AM APPT WITH HER NEUROLOGIST IF PT IS STABLE) Do you have the ability to pay for your medications: Yes,  private insurance and medicare  Release of information consent forms completed and submitted to medical records by CSW.  Patient to Follow up at: Follow-up Information    Follow up with [facility] .   Why:  Clinical Social Worker spoke with Aggie Cosier in admissions. You have been accepted for admission but due to 3 previous attempts to pick you up for transport to facility, you must have transportation to facility if you decide to accept this admission.    Contact information:   [address] [place], [place] [address] Phone: [phone] Fax: [phone]      Follow up with [facility] On 08/04/2015.   Why:  Appt on this date for Neurology appt.    Contact information:   ATTN: Dr. Allena Earing [address], [place] [address] Phone: [phone] Fax: [phone]      Follow up with [facility].   Why:  Walk in between 8am-9am Monday through Friday for hospital follow-up/medication management/assessment for counseling and substance abuse outpatient services if interested.    Contact information:   [address] [place], [place] [address] Phone: [phone] Fax: [phone]      Next level of care provider has access to [facility] Link:no  Safety Planning and Suicide Prevention discussed: Yes,  SPE attempt made with pt's friend. SPE completed with pt; SPI pamphlet and Mobile Crisis information provided to pt and she was encouraged to share information with support network, ask questions, and talk about any concerns relating to SPE.  Have you used any form of tobacco in the last 30 days? (Cigarettes, Smokeless  Tobacco, Cigars, and/or Pipes): Yes  Has patient been referred to the Quitline?: Patient refused referral  Patient has been referred for addiction treatment: Yes  Smart, Remmy Riffe LCSW 08/01/2015, 3:17 PM

## 2015-08-01 NOTE — [facility] Group Notes (Signed)
[facility] LCSW Aftercare Discharge Planning Group Note   08/01/2015 10:41 AM  Participation Quality:  Anxious  Mood/Affect:  Anxious and Irritable  Depression Rating:  5  Anxiety Rating:  5  Thoughts of Suicide:  No Will you contract for safety?   NA  Current AVH:  No  Plan for Discharge/Comments:  Pt reports that she is no longer interested in [facility] [place] and wants to move to [place] with a friend for "about 6 months." "I"m interested in something outpatient. Pt reports that she has MS that has been untreated. "I'm also drinking some." Pt upset with her daughters and her mother for "committing me again." Pt reports that she sees PCP at "[facility]."  [employer]: unknown at this time.  Supports: pt upset with her mother and daughter  Jimmye Norman, Herbert Seta LCSW

## 2015-08-01 NOTE — Progress Notes (Signed)
D:  Patient's rated depression 4-5, hopeless 3-4, anxiety 5.  Denied SI and HI, contracts for safety.  Denied A/V hallucinations. A:  Patient has refused some medications throughout the day.  Will discuss medications with MD tomorrow. R:  Safety maintained with 15 minute checks. Patient has wheelchair and walker in her room.  Patient does not use her wheelchair at times.  Patient has been reminded repeatedly throughout the day to use wheelchair or walker.
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
## 2015-08-01 NOTE — H&P (Signed)
Psychiatric Admission Assessment Adult  [name] MRN:  [id number] Date of Evaluation:  08/01/2015 Chief Complaint:  ALCOHOL USE DISORDER,SEVERE MDD RECURRENT,SEVERE Principal Diagnosis: Bipolar disorder, current episode mixed, moderate ([place]) Diagnosis:   Patient Active Problem List   Diagnosis Date Noted  . Bipolar disorder, current episode mixed, moderate ([place]) [F31.62] 08/01/2015  . Alcohol use disorder, severe, dependence ([place]) [F10.20] 07/31/2015  . Alcoholic cirrhosis ([place]) [K24.09] 05/31/2015  . Alcoholic hepatitis with ascites [K70.11] 05/31/2015  . Macrocytic anemia- due to alcohol abuse with normal B12 & folate levels [D53.9] 05/31/2015  . Severe protein-calorie malnutrition ([place]) [E43] 05/31/2015  . Hypokalemia [E87.6] 05/26/2015  . Encephalopathy, hepatic ([place]) [K72.90] 05/26/2015  . Anxiety [F41.9] 05/18/2015  . Sepsis ([place]) [A41.9] 05/18/2015  . UTI (lower urinary tract infection) [N39.0] 05/18/2015  . Stimulant abuse [F15.10] 12/27/2014  . Nicotine abuse [F19.10] 12/27/2014  . Hyperprolactinemia ([place]) [E22.1] 11/15/2014  . Dyslipidemia [E78.5]   . Noncompliance with therapeutic plan [Z91.11] 04/04/2013  . Unspecified hereditary and idiopathic peripheral neuropathy [G60.9] 05/01/2012  . GERD (gastroesophageal reflux disease) [K21.9] 05/01/2012  . Osteoarthrosis, unspecified whether generalized or localized, involving lower leg [M17.9] 05/01/2012  . MS (multiple sclerosis) ([place]) [G35] 09/14/2011   History of Present Illness:  Anne Black, 55 yo was IVC petitioned by her daughters and mother.  "They said, I was suicidal and I'm not."  She said that her main problem is her MS.  [facility] manages her MS.  She reports that she saw them last Oct 2016.  "They wanted me to get MRI of my back and brain and I haven't been able to get it.  My last one was 6 years ago.  I am in so much pain and I know its because my MS is not cared for and so I drink  alcolhol so that I forget about my chronic pain."  Patient's BAL was 196 on admission.   Collateral information obtained from her friend Dawayne Patricia [phone] (cell), [phone] (home).  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  Per Newman, "Delayne's problem with alcohol worsened because her MS diagnosis is not properly taken care of.  She substitutes alcohol so manage her pain.    Patient denies SI/HI/AVH.  Associated Signs/Symptoms: Depression Symptoms:  hopelessness, anxiety, (Hypo) Manic Symptoms:  Elevated Mood, Impulsivity, Irritable Mood, Labiality of Mood, Anxiety Symptoms:  Excessive Worry, Social Anxiety, Psychotic Symptoms:  NA PTSD Symptoms: NA Total Time spent with patient: 45 minutes  Past Psychiatric History: see HPI  Is the patient at risk to self? Yes.    Has the patient been a risk to self in the past 6 months? Yes.    Has the patient been a risk to self within the distant past? Yes.    Is the patient a risk to others? Yes.    Has the patient been a risk to others in the past 6 months? No.  Has the patient been a risk to others within the distant past? No.   Prior Inpatient Therapy:   Prior Outpatient Therapy:    Alcohol Screening: Patient refused Alcohol Screening Tool: Yes 1. How often do you have a drink containing alcohol?: 4 or more times a week 2. How many drinks containing alcohol do you have on a typical day when you are drinking?: 3 or 4 3. How often do you have six or more drinks on one occasion?: Weekly Preliminary Score: 4 4. How often  during the last year have you found that you were not able to stop drinking once you had started?: Never 5. How often during the last year have you failed to do what was normally expected from you becasue of drinking?: Never 6. How often during the last year have you needed a first drink in the morning to get yourself going after a heavy drinking session?: Never 7. How often during  the last year have you had a feeling of guilt of remorse after drinking?: Weekly 8. How often during the last year have you been unable to remember what happened the night before because you had been drinking?: Never 9. Have you or someone else been injured as a result of your drinking?: No 10. Has a relative or friend or a doctor or another health worker been concerned about your drinking or suggested you cut down?: Yes, during the last year Alcohol Use Disorder Identification Test Final Score (AUDIT): 15 Brief Intervention: Patient declined brief intervention Substance Abuse History in the last 12 months:  Yes.   Consequences of Substance Abuse: inpatient crisis management  Previous Psychotropic Medications: Yes  Psychological Evaluations: Yes  Past Medical History:  Past Medical History  Diagnosis Date  . Multiple sclerosis ([place])   . Arthritis   . Anxiety   . Bipolar disorder ([place])   . ADHD (attention deficit hyperactivity disorder)   . Migraine   . Osteoporosis     Past Surgical History  Procedure Laterality Date  . Cesarean section  [id number]  . Myringotomy with tube placement Bilateral   . Tonsillectomy     Family History:  Family History  Problem Relation Age of Onset  . Heart disease Mother   . Heart disease Father   . Hypertension Father    Family Psychiatric  History: see HPI  Tobacco Screening: '@FLOW' [phone])::1)@ Social History:  History  Alcohol Use  . 4.8 oz/week  . 2 Glasses of wine, 2 Cans of beer, 4 Standard drinks or equivalent per week    Comment: "I'm a social drinker." "I drink a glass of wine nightly."     History  Drug Use No    Additional Social History:      Pain Medications: Please see EPIC notes  Prescriptions: Risperdal, Prozac Over the Counter: Please see EPIC notes History of alcohol / drug use?: Yes Longest period of sobriety (when/how long): unknown Negative Consequences of Use: Financial, Scientist, research (physical sciences), Personal relationships, Work /  School Withdrawal Symptoms: Irritability, Nausea / Vomiting, Sweats, Tremors Name of Substance 1: alcohol  1 - Age of First Use: unknown 1 - Amount (size/oz): 3-4 "tall fruity beers" 1 - Frequency: daily 1 - Duration: ongoing 1 - Last Use / Amount: yesterday    Allergies:  No Known Allergies Lab Results:  Results for orders placed or performed during the hospital encounter of 07/30/15 (from the past 48 hour(s))  Rapid urine drug screen (hospital performed)     Status: None   Collection Time: 07/30/15 11:40 PM  Result Value Ref Range   Opiates NONE DETECTED NONE DETECTED   Cocaine NONE DETECTED NONE DETECTED   Benzodiazepines NONE DETECTED NONE DETECTED   Amphetamines NONE DETECTED NONE DETECTED   Tetrahydrocannabinol NONE DETECTED NONE DETECTED   Barbiturates NONE DETECTED NONE DETECTED    Comment:        DRUG SCREEN FOR MEDICAL PURPOSES ONLY.  IF CONFIRMATION IS NEEDED FOR ANY PURPOSE, NOTIFY LAB WITHIN 5 DAYS.        LOWEST DETECTABLE  LIMITS FOR URINE DRUG SCREEN Drug Class       Cutoff (ng/mL) Amphetamine      1000 Barbiturate      200 Benzodiazepine   308 Tricyclics       657 Opiates          300 Cocaine          300 THC              50   Comprehensive metabolic panel     Status: Abnormal   Collection Time: 07/31/15 12:07 AM  Result Value Ref Range   Sodium 135 135 - 145 mmol/L   Potassium 2.6 (LL) 3.5 - 5.1 mmol/L    Comment: CRITICAL RESULT CALLED TO, READ BACK BY AND VERIFIED WITH: Assunta Found RN [id number] 07/31/15 A NAVARRO    Chloride 93 (L) 101 - 111 mmol/L   CO2 31 22 - 32 mmol/L   Glucose, Bld 114 (H) 65 - 99 mg/dL   BUN <5 (L) 6 - 20 mg/dL   Creatinine, Ser 0.40 (L) 0.44 - 1.00 mg/dL   Calcium 8.2 (L) 8.9 - 10.3 mg/dL   Total Protein 6.6 6.5 - 8.1 g/dL   Albumin 3.4 (L) 3.5 - 5.0 g/dL   AST 111 (H) 15 - 41 U/L   ALT 71 (H) 14 - 54 U/L   Alkaline Phosphatase 253 (H) 38 - 126 U/L   Total Bilirubin 2.1 (H) 0.3 - 1.2 mg/dL   GFR calc non Af Amer >60 >60  mL/min   GFR calc Af Amer >60 >60 mL/min    Comment: (NOTE) The eGFR has been calculated using the CKD EPI equation. This calculation has not been validated in all clinical situations. eGFR's persistently <60 mL/min signify possible Chronic Kidney Disease.    Anion gap 11 5 - 15  Ethanol     Status: Abnormal   Collection Time: 07/31/15 12:07 AM  Result Value Ref Range   Alcohol, Ethyl (B) 196 (H) <5 mg/dL    Comment:        LOWEST DETECTABLE LIMIT FOR SERUM ALCOHOL IS 5 mg/dL FOR MEDICAL PURPOSES ONLY   Salicylate level     Status: None   Collection Time: 07/31/15 12:07 AM  Result Value Ref Range   Salicylate Lvl <6.2 2.8 - 30.0 mg/dL  Acetaminophen level     Status: Abnormal   Collection Time: 07/31/15 12:07 AM  Result Value Ref Range   Acetaminophen (Tylenol), Serum <10 (L) 10 - 30 ug/mL    Comment:        THERAPEUTIC CONCENTRATIONS VARY SIGNIFICANTLY. A RANGE OF 10-30 ug/mL MAY BE AN EFFECTIVE CONCENTRATION FOR MANY PATIENTS. HOWEVER, SOME ARE BEST TREATED AT CONCENTRATIONS OUTSIDE THIS RANGE. ACETAMINOPHEN CONCENTRATIONS >150 ug/mL AT 4 HOURS AFTER INGESTION AND >50 ug/mL AT 12 HOURS AFTER INGESTION ARE OFTEN ASSOCIATED WITH TOXIC REACTIONS.   cbc     Status: Abnormal   Collection Time: 07/31/15 12:07 AM  Result Value Ref Range   WBC 9.5 4.0 - 10.5 K/uL   RBC 3.88 3.87 - 5.11 MIL/uL   Hemoglobin 13.2 12.0 - 15.0 g/dL   HCT 38.8 36.0 - 46.0 %   MCV 100.0 78.0 - 100.0 fL   MCH 34.0 26.0 - 34.0 pg   MCHC 34.0 30.0 - 36.0 g/dL   RDW 15.3 11.5 - 15.5 %   Platelets 145 (L) 150 - 400 K/uL    Blood Alcohol level:  Lab Results  Component Value Date  ETH 196* 07/31/2015   ETH <5 61/60/7371    Metabolic Disorder Labs:  Lab Results  Component Value Date   HGBA1C 5.5 11/02/2014   MPG 111 11/02/2014   Lab Results  Component Value Date   PROLACTIN 26.3* 11/13/2014   Lab Results  Component Value Date   CHOL 243* 11/02/2014   TRIG 154* 11/02/2014   HDL  39* 11/02/2014   CHOLHDL 6.2 11/02/2014   VLDL 31 11/02/2014   LDLCALC 173* 11/02/2014    Current Medications: Current Facility-Administered Medications  Medication Dose Route Frequency Provider Last Rate Last Dose  . acetaminophen (TYLENOL) tablet 650 mg  650 mg Oral Q6H PRN Patrecia Pour, NP   650 mg at 07/31/15 1835  . albuterol (PROVENTIL HFA;VENTOLIN HFA) 108 (90 Base) MCG/ACT inhaler 2 puff  2 puff Inhalation Q6H PRN Patrecia Pour, NP      . alum & mag hydroxide-simeth (MAALOX/MYLANTA) 200-200-20 MG/5ML suspension 30 mL  30 mL Oral Q4H PRN Patrecia Pour, NP      . amantadine (SYMMETREL) capsule 100 mg  100 mg Oral BID Patrecia Pour, NP   100 mg at 08/01/15 0813  . diphenhydrAMINE (BENADRYL) capsule 50 mg  50 mg Oral QHS PRN Patrecia Pour, NP   50 mg at 07/31/15 2216  . FLUoxetine (PROZAC) capsule 20 mg  20 mg Oral Daily Patrecia Pour, NP   20 mg at 08/01/15 0626  . furosemide (LASIX) tablet 40 mg  40 mg Oral BID Patrecia Pour, NP   40 mg at 07/31/15 1834  . gabapentin (NEURONTIN) capsule 200 mg  200 mg Oral TID Patrecia Pour, NP   200 mg at 08/01/15 9485  . gi cocktail (Maalox,Lidocaine,Donnatal)  30 mL Oral BID PRN Patrecia Pour, NP      . hydrOXYzine (ATARAX/VISTARIL) tablet 25 mg  25 mg Oral Q6H PRN Patrecia Pour, NP      . loperamide (IMODIUM) capsule 2-4 mg  2-4 mg Oral PRN Patrecia Pour, NP      . LORazepam (ATIVAN) tablet 1 mg  1 mg Oral Q6H PRN Patrecia Pour, NP   1 mg at 08/01/15 0817  . magnesium hydroxide (MILK OF MAGNESIA) suspension 30 mL  30 mL Oral Daily PRN Patrecia Pour, NP      . meloxicam [facility]) tablet 7.5 mg  7.5 mg Oral Daily Patrecia Pour, NP   7.5 mg at 08/01/15 0813  . mometasone-formoterol (DULERA) 200-5 MCG/ACT inhaler 2 puff  2 puff Inhalation BID Patrecia Pour, NP   2 puff at 08/01/15 0813  . multivitamin with minerals tablet 1 tablet  1 tablet Oral Daily Patrecia Pour, NP   1 tablet at 08/01/15 4627  . nicotine (NICODERM CQ - dosed in mg/24  hours) patch 21 mg  21 mg Transdermal Daily Patrecia Pour, NP   21 mg at 08/01/15 0809  . ondansetron (ZOFRAN-ODT) disintegrating tablet 4 mg  4 mg Oral Q6H PRN Patrecia Pour, NP      . pantoprazole (PROTONIX) EC tablet 40 mg  40 mg Oral Daily Patrecia Pour, NP   40 mg at 08/01/15 0350  . potassium chloride SA (K-DUR,KLOR-CON) CR tablet 20 mEq  20 mEq Oral BID Ursula Alert, MD   20 mEq at 08/01/15 1213  . rifaximin (XIFAXAN) tablet 550 mg  550 mg Oral BID Patrecia Pour, NP   550 mg at 08/01/15 0938  .  risperiDONE (RISPERDAL) tablet 0.5 mg  0.5 mg Oral QHS Saramma Eappen, MD      . spironolactone (ALDACTONE) tablet 100 mg  100 mg Oral BID Patrecia Pour, NP   100 mg at 07/31/15 1834  . thiamine (VITAMIN B-1) tablet 100 mg  100 mg Oral Daily Patrecia Pour, NP   100 mg at 08/01/15 0600  . topiramate (TOPAMAX) tablet 100 mg  100 mg Oral QHS Patrecia Pour, NP       PTA Medications: Prescriptions prior to admission  Medication Sig Dispense Refill Last Dose  . albuterol (PROVENTIL HFA;VENTOLIN HFA) 108 (90 BASE) MCG/ACT inhaler Inhale 2 puffs into the lungs every 6 (six) hours as needed for wheezing. 1 Inhaler 0 07/30/2015 at Unknown time  . Alum & Mag Hydroxide-Simeth (GI COCKTAIL) SUSP suspension Take 30 mLs by mouth 2 (two) times daily as needed for indigestion. Shake well.   Past Month at Unknown time  . amantadine (SYMMETREL) 100 MG capsule Take 1 capsule (100 mg total) by mouth 2 (two) times daily. Reported on 02/20/2015   Past Week at Unknown time  . diphenhydrAMINE (BENADRYL) 25 MG tablet Take 50 mg by mouth at bedtime as needed for sleep.    07/30/2015 at Unknown time  . fluconazole (DIFLUCAN) 200 MG tablet Take 1 tablet (200 mg total) by mouth daily. (Patient not taking: Reported on 07/31/2015) 4 tablet 0 Taking  . FLUoxetine (PROZAC) 20 MG capsule Take 1 capsule (20 mg total) by mouth daily. Reported on 02/20/2015 90 capsule 0 07/30/2015 at Unknown time  . Fluticasone-Salmeterol (ADVAIR)  250-50 MCG/DOSE AEPB Inhale 1 puff into the lungs 2 (two) times daily.   07/30/2015 at Unknown time  . furosemide (LASIX) 40 MG tablet Take 1 tablet (40 mg total) by mouth 2 (two) times daily. 60 tablet 0 Past Week at Unknown time  . hydrOXYzine (ATARAX/VISTARIL) 10 MG tablet TAKE 1 TABLET(10 MG) BY MOUTH THREE TIMES DAILY AS NEEDED 60 tablet 0 07/30/2015 at Unknown time  . lactulose (CHRONULAC) 10 GM/15ML solution Take 15 mLs (10 g total) by mouth 2 (two) times daily. (Patient not taking: Reported on 07/31/2015) 240 mL 1 Taking  . lidocaine (LIDODERM) 5 % Place 1 patch onto the skin daily as needed (for area underarm for redness/swelling/pain.). Reported on 04/06/2015   Past Month at Unknown time  . lisdexamfetamine (VYVANSE) 50 MG capsule Take 50 mg by mouth daily.   Past Month at Unknown time  . meloxicam (MOBIC) 7.5 MG tablet Take 7.5 mg by mouth daily.    07/30/2015 at Unknown time  . nicotine (NICODERM CQ - DOSED IN MG/24 HOURS) 21 mg/24hr patch Place 1 patch (21 mg total) onto the skin daily. 28 patch 0 07/30/2015 at Unknown time  . ondansetron (ZOFRAN) 4 MG tablet Take 1 tablet (4 mg total) by mouth every 8 (eight) hours as needed for nausea or vomiting. (Patient not taking: Reported on 07/31/2015) 30 tablet 0 Taking  . ondansetron (ZOFRAN-ODT) 4 MG disintegrating tablet DISSOLVE 1 TABLET BY MOUTH EVERY 6 TO 8 HOURS AS NEEDED FOR NAUSEA 12 tablet 0 Past Week at Unknown time  . pantoprazole (PROTONIX) 40 MG tablet Take 1 tablet (40 mg total) by mouth daily. 30 tablet 0 07/30/2015 at Unknown time  . rifaximin (XIFAXAN) 550 MG TABS tablet Take 1 tablet (550 mg total) by mouth 2 (two) times daily. 60 tablet 0 Past Month at Unknown time  . risperiDONE (RISPERDAL) 0.5 MG tablet Take 5 tablets (  2.5 mg total) by mouth at bedtime. (Patient taking differently: Take 1.5 mg by mouth at bedtime. ) 150 tablet 0 07/30/2015 at Unknown time  . rizatriptan (MAXALT) 10 MG tablet Take 10 mg by mouth daily as needed for  migraine. Reported on 02/20/2015  0 Past Month at Unknown time  . spironolactone (ALDACTONE) 100 MG tablet Take 1 tablet (100 mg total) by mouth 2 (two) times daily. 60 tablet 0 Past Month at Unknown time  . topiramate (TOPAMAX) 100 MG tablet Take 1 tablet (100 mg total) by mouth daily. 30 tablet 0 07/30/2015 at 2200    Musculoskeletal: Strength & Muscle Tone: within normal limits Gait & Station: normal Patient leans: N/A  Psychiatric Specialty Exam: Physical Exam  ROS  Blood pressure 116/86, pulse 95, temperature 98.5 F (36.9 C), temperature source Oral, resp. rate 16, height '5\' 1"'  (1.549 m), weight 87.544 kg (193 lb), SpO2 96 %.Body mass index is 36.49 kg/(m^2).  General Appearance: Casual  Eye Contact:  Fair  Speech:  Normal Rate  Volume:  Decreased  Mood:  Anxious  Affect:  Labile  Thought Process:  Goal Directed  Orientation:  Full (Time, Place, and Person)  Thought Content:  Paranoid Ideation and Rumination  Suicidal Thoughts:  No  Homicidal Thoughts:  No  Memory:  Immediate;   Fair Recent;   Fair Remote;   Fair  Judgement:  Poor  Insight:  Lacking  Psychomotor Activity:  Restlessness and Tremor  Concentration:  Concentration: Fair and Attention Span: Fair  Recall:  [employer] of Knowledge:  Fair  Language:  Good  Akathisia:  Negative  Handed:  Right  AIMS (if indicated):     Assets:  Communication Skills Desire for Improvement Resilience  ADL's:  Intact  Cognition:  WNL  Sleep:  Number of Hours: 6   Treatment Plan Summary: Admit for crisis management and mood stabilization. Medication management to re-stabilize current mood symptoms Group counseling sessions for coping skills Medical consults as needed Review and reinstate any pertinent home medications for other health problems  Observation Level/Precautions:  15 minute checks  Laboratory:  per ED  Psychotherapy:  group  Medications:  As per medlist, cont home meds, Prozac 20 mg QD depression, Gabapentin  200 mg BID anxiety, Hydroxyzine   Consultations:  As needed  Discharge Concerns:  safety  Estimated LOS:  2-7 days  Other:  MRI neck and brain w/wo contrast for Mult Sclerosis   I certify that inpatient services furnished can reasonably be expected to improve the patient's condition.    [facility], NP [facility] 6/23/201712:57 PM

## 2015-08-01 NOTE — [facility] Suicide Risk Assessment (Signed)
[facility] INPATIENT:  Family/Significant Other Suicide Prevention Education  Suicide Prevention Education:  Contact Attempts: [name] (pt's friend) [phone] has been identified by the patient as the family member/significant other with whom the patient will be residing, and identified as the person(s) who will aid the patient in the event of a mental health crisis.  With written consent from the patient, two attempts were made to provide suicide prevention education, prior to and/or following the patient's discharge.  We were unsuccessful in providing suicide prevention education.  A suicide education pamphlet was given to the patient to share with family/significant other.  Date and time of first attempt: 3:00PM 08/01/15 Date and time of second attempt:  8:20AM/08/03/15  Smart, Heather LCSW 08/01/2015, 2:59 PM

## 2015-08-01 NOTE — Progress Notes (Signed)
Pt is new to the unit this afternoon, but has been a patient on the unit in the past.  Pt reports that her daughters had her involuntarily committed, saying that she was having suicidal thoughts.  Pt denies this, saying that she was going to go to [facility] tomorrow for treatment r/t to her alcohol abuse.  She says that because she is here, that she will probably lose her bed at [facility].  The facility was going to come pick her up tomorrow.  According to the first shift RN, this has been substantiated with [facility], and first shift RN reported that the CSW was going to look into getting pt transferred to the facility.  Pt denies SI/HI/AVH with Clinical research associate.  Pt reports having chronic pain.  She also is incontinent of urine and had one such accident this evening.  Pt chose not to go to group tonight.  Pt moves very slowly and is a high fall risk.  Pt has been medicated per orders given.  She makes her needs known.  Support and encouragement offered.  Discharge plans are in process.  Pt has been encouraged to request help with getting out of bed if needed.  Safety maintained with q15 minute checks.

## 2015-08-01 NOTE — [facility] Suicide Risk Assessment (Signed)
[facility] Admission Suicide Risk Assessment   Nursing information obtained from:    Demographic factors:    Current Mental Status:    Loss Factors:    Historical Factors:    Risk Reduction Factors:     Total Time spent with patient: 30 minutes Principal Problem: Bipolar disorder, current episode mixed, moderate (HCC) Diagnosis:   Patient Active Problem List   Diagnosis Date Noted  . Bipolar disorder, current episode mixed, moderate (HCC) [F31.62] 08/01/2015  . Alcohol use disorder, severe, dependence (HCC) [F10.20] 07/31/2015  . Alcoholic cirrhosis (HCC) [K70.30] 05/31/2015  . Alcoholic hepatitis with ascites [K70.11] 05/31/2015  . Macrocytic anemia- due to alcohol abuse with normal B12 & folate levels [D53.9] 05/31/2015  . Severe protein-calorie malnutrition (HCC) [E43] 05/31/2015  . Hypokalemia [E87.6] 05/26/2015  . Encephalopathy, hepatic (HCC) [K72.90] 05/26/2015  . Anxiety [F41.9] 05/18/2015  . Sepsis (HCC) [A41.9] 05/18/2015  . UTI (lower urinary tract infection) [N39.0] 05/18/2015  . Stimulant abuse [F15.10] 12/27/2014  . Nicotine abuse [F19.10] 12/27/2014  . Hyperprolactinemia (HCC) [E22.1] 11/15/2014  . Dyslipidemia [E78.5]   . Noncompliance with therapeutic plan [Z91.11] 04/04/2013  . Unspecified hereditary and idiopathic peripheral neuropathy [G60.9] 05/01/2012  . GERD (gastroesophageal reflux disease) [K21.9] 05/01/2012  . Osteoarthrosis, unspecified whether generalized or localized, involving lower leg [M17.9] 05/01/2012  . MS (multiple sclerosis) (HCC) [G35] 09/14/2011   Subjective Data: Patient states " I have liver disease and my family does not want me to drink at all. It should not be that way , I should be the one to make that decision. I want to go to [place] or go to [place]. I am Bipolar , I do have some sx, but not a lot. Risperidone has helped in the past. I am willing to take that."    Continued Clinical Symptoms:  Alcohol Use Disorder Identification Test  Final Score (AUDIT): 15 The "Alcohol Use Disorders Identification Test", Guidelines for Use in Primary Care, Second Edition.  World Science writer [facility]). Score between 0-7:  no or low risk or alcohol related problems. Score between 8-15:  moderate risk of alcohol related problems. Score between 16-19:  high risk of alcohol related problems. Score 20 or above:  warrants further diagnostic evaluation for alcohol dependence and treatment.   CLINICAL FACTORS:   Bipolar Disorder:   Mixed State Alcohol/Substance Abuse/Dependencies   Musculoskeletal: Strength & Muscle Tone: within normal limits Gait & Station: normal Patient leans: N/A  Psychiatric Specialty Exam: Physical Exam  Nursing note and vitals reviewed.   Review of Systems  Psychiatric/Behavioral: Positive for substance abuse. The patient is nervous/anxious.     Blood pressure 124/64, pulse 108, temperature 98.5 F (36.9 C), temperature source Oral, resp. rate 24, height  (1.549 m), weight 87.544 kg (193 lb), SpO2 96 %.Body mass index is 36.49 kg/(m^2).  General Appearance: Casual  Eye Contact:  Fair  Speech:  Normal Rate  Volume:  Decreased  Mood:  Anxious  Affect:  Labile  Thought Process:  Goal Directed and Descriptions of Associations: Intact  Orientation:  Full (Time, Place, and Person)  Thought Content:  Paranoid Ideation and Rumination  Suicidal Thoughts:  denies now - but per family endorses plan to kill self  Homicidal Thoughts:  No  Memory:  Immediate;   Fair Recent;   Fair Remote;   Fair  Judgement:  Impaired  Insight:  Shallow  Psychomotor Activity:  Restlessness and Tremor  Concentration:  Concentration: Fair and Attention Span: Fair  Recall:  Fair  Fund of Knowledge:  Fair  Language:  Fair  Akathisia:  No  Handed:  Right  AIMS (if indicated):     Assets:  Desire for Improvement  ADL's:  Intact  Cognition:  WNL  Sleep:  Number of Hours: 6      COGNITIVE FEATURES THAT CONTRIBUTE TO  RISK:  Closed-mindedness, Polarized thinking and Thought constriction (tunnel vision)    SUICIDE RISK:   Moderate:  Frequent suicidal ideation with limited intensity, and duration, some specificity in terms of plans, no associated intent, good self-control, limited dysphoria/symptomatology, some risk factors present, and identifiable protective factors, including available and accessible social support.  PLAN OF CARE: Please see H&P. Pt to be monitored on the unit , start CIWA protocol and restart medications as needed.   I certify that inpatient services furnished can reasonably be expected to improve the patient's condition.   Aquarius Latouche, MD 08/01/2015, 10:54 AM

## 2015-08-01 NOTE — [facility] Group Notes (Signed)
[facility] LCSW Group Therapy  08/01/2015 3:28 PM  Type of Therapy:  Group Therapy  Participation Level:  Active  Participation Quality:  Attentive  Affect:  Appropriate  Cognitive:  Oriented  Insight:  Limited and Poor  Engagement in Therapy:  Improving  Modes of Intervention:  Confrontation, Discussion, Education, Exploration, Problem-solving, Rapport Building, Socialization and Support  Summary of Progress/Problems: Feelings around Relapse. Group members discussed the meaning of relapse and shared personal stories of relapse, how it affected them and others, and how they perceived themselves during this time. Group members were encouraged to identify triggers, warning signs and coping skills used when facing the possibility of relapse. Social supports were discussed and explored in detail. Post Acute Withdrawal Syndrome (handout provided) was introduced and examined. Pt's were encouraged to ask questions, talk about key points associated with PAWS, and process this information in terms of relapse prevention. Anne Black was attentive and engaged during today's processing group. She continues to exhibit minimal insight into alcohol abuse and blames her mother and children from committing to "for the 3rd time here." Anne Black states that she is somewhat interested in treatment but is not willing to commit to inpatient at this time. "My main concern is getting my brain scan and starting to get treatment for my MS. That will help with the drinking I think."   Smart, Jazmine Heckman LCSW 08/01/2015, 3:28 PM

## 2015-08-02 ENCOUNTER — Other Ambulatory Visit: Payer: Self-pay

## 2015-08-02 ENCOUNTER — Encounter (HOSPITAL_COMMUNITY): Payer: Self-pay | Admitting: Emergency Medicine

## 2015-08-02 ENCOUNTER — Inpatient Hospital Stay (HOSPITAL_COMMUNITY): Payer: Managed Care, Other (non HMO)

## 2015-08-02 ENCOUNTER — Emergency Department (HOSPITAL_COMMUNITY)
Admission: EM | Admit: 2015-08-02 | Discharge: 2015-08-02 | Discharge status: Absent without leave | Payer: Managed Care, Other (non HMO)

## 2015-08-02 LAB — LIPID PANEL
CHOL/HDL RATIO: 3.3 ratio
CHOLESTEROL: 119 mg/dL (ref 0–200)
HDL: 36 mg/dL — ABNORMAL LOW (ref >40)
LDL CALC: 69 mg/dL (ref 0–99)
TRIGLYCERIDES: 68 mg/dL (ref <150)
VLDL: 14 mg/dL (ref 0–40)

## 2015-08-02 LAB — MAGNESIUM: Magnesium: 1.5 mg/dL — ABNORMAL LOW (ref 1.7–2.4)

## 2015-08-02 LAB — TSH: TSH: 3.771 u[IU]/mL (ref 0.350–4.500)

## 2015-08-02 LAB — COMPREHENSIVE METABOLIC PANEL
ALT: 46 U/L (ref 14–54)
AST: 62 U/L — AB (ref 15–41)
Albumin: 2.9 g/dL — ABNORMAL LOW (ref 3.5–5.0)
Alkaline Phosphatase: 215 U/L — ABNORMAL HIGH (ref 38–126)
Anion gap: 6 (ref 5–15)
CHLORIDE: 99 mmol/L — AB (ref 101–111)
CO2: 31 mmol/L (ref 22–32)
Calcium: 8.3 mg/dL — ABNORMAL LOW (ref 8.9–10.3)
Creatinine, Ser: 0.41 mg/dL — ABNORMAL LOW (ref 0.44–1.00)
Glucose, Bld: 95 mg/dL (ref 65–99)
POTASSIUM: 2.9 mmol/L — AB (ref 3.5–5.1)
SODIUM: 136 mmol/L (ref 135–145)
Total Bilirubin: 3 mg/dL — ABNORMAL HIGH (ref 0.3–1.2)
Total Protein: 5.9 g/dL — ABNORMAL LOW (ref 6.5–8.1)

## 2015-08-02 LAB — POTASSIUM: Potassium: 2.4 mmol/L — CL (ref 3.5–5.1)

## 2015-08-02 MED ORDER — GADOBENATE DIMEGLUMINE 529 MG/ML IV SOLN
18.0000 mL | Freq: Once | INTRAVENOUS | Status: AC | PRN
  Administered 2015-08-02: 18 mL via INTRAVENOUS
  Filled 2015-08-02: qty 18

## 2015-08-02 NOTE — ED Notes (Signed)
Bed: WA09 Expected date:  Expected time:  Means of arrival:  Comments: From [facility] low K

## 2015-08-02 NOTE — Progress Notes (Signed)
D Pt is seen standing-alone- in the middle of the 300 hall this morning at 0730. She is groggy, unsteady on her feet and this is evidenced by her weaving from side to side as she stands in the hall. She shares that she is " unsteady" this morning and she attributes this to her MS.SHe says " don't give me that water pill today..I  peed all over myself yesterday after I took it ". [name] completes her daily assessment and on it she wrote she denied experiencing SI today and she rated her depression, hopelessness and anxiety " 3/2/5", respectively. R Safety in place with pt placed on 1:1 due extremely unstable gait.

## 2015-08-02 NOTE — Progress Notes (Signed)
D Anne Black returned to the unit at 1730...after undergoing the MRI of her brain and ehr C-spine, both with and without contrast. She is irriatle, remains unsteady on her feet and demanding as soon as she hits the front door of the building. R Safety in place.

## 2015-08-02 NOTE — ED Notes (Signed)
Patient from [facility] arrived with EMS with stretcher, with complain of hypokalemia. Patient has had lab drawn today and K was 2.9. Was sent here for treatment.

## 2015-08-02 NOTE — ED Notes (Signed)
Bed: WTR7 Expected date:  Expected time:  Means of arrival:  Comments: Labs 

## 2015-08-02 NOTE — ED Provider Notes (Signed)
CSN: [id number]     Arrival date & time 08/02/15  2133 History  By signing my name below, I, Renetta Chalk, attest that this documentation has been prepared under the direction and in the presence of Whisper Kurka PA-C. Electronically Signed: Renetta Chalk, ED Scribe. 07/06/2015. 4:01 PM.   Chief Complaint  Patient presents with  . Hypokalemia    The history is provided by the patient. No language interpreter was used.   HPI Comments: Anne Black is a 55 y.o. female with a PMHx of MS, Bipolar Disorder, EtOH abuse, brought in by ambulance, after being sent to the ED to be treated for low potassium. Pt's IVC paperwork was taken out by her daughter on July 30, 2015  And she was sent to [facility] for inpatient psychiatric care. She had low potassium on 6/21, they replaced the potassium. They rechecked  her potassium today and it was 2.9. Pt had an already scheduled MRI with and without contrast, brain and cervical spine which she had done today, results to be follow-up by provider at [facility], per patient. Pt states she has been having intermittent diarrhea for the past few months. Pt currently takes Protonix. Pt also states she has been having bowel and bladder incontinence this evening which she gets intermittently due to urgency. Denies any hematuria or hematochezia. Pt denies any HI, SI. Pt states she her PCP is a PA at [facility].    Past Medical History  Diagnosis Date  . Multiple sclerosis (HCC)   . Arthritis   . Anxiety   . Bipolar disorder (HCC)   . ADHD (attention deficit hyperactivity disorder)   . Migraine   . Osteoporosis    Past Surgical History  Procedure Laterality Date  . Cesarean section  [id number]  . Myringotomy with tube placement Bilateral   . Tonsillectomy     Family History  Problem Relation Age of Onset  . Heart disease Mother   . Heart disease Father   . Hypertension Father    Social History  Substance Use Topics  . Smoking status: Current Every Day Smoker -- 0.75  packs/day for 20 years    Types: Cigarettes  . Smokeless tobacco: Never Used     Comment: is cutting back, not in the car, house  . Alcohol Use: 4.8 oz/week    2 Glasses of wine, 2 Cans of beer, 4 Standard drinks or equivalent per week     Comment: "I'm a social drinker." "I drink a glass of wine nightly."   OB History    No data available     Review of Systems  Gastrointestinal: Positive for diarrhea.  Genitourinary: Positive for urgency. Negative for hematuria.  All other systems reviewed and are negative.     Allergies  Review of patient's allergies indicates no known allergies.  Home Medications   Prior to Admission medications   Medication Sig Start Date End Date Taking? Authorizing Provider  albuterol (PROVENTIL HFA;VENTOLIN HFA) 108 (90 BASE) MCG/ACT inhaler Inhale 2 puffs into the lungs every 6 (six) hours as needed for wheezing. 02/28/14   Zyia Kaneko L Reed, DO  Alum & Mag Hydroxide-Simeth (GI COCKTAIL) SUSP suspension Take 30 mLs by mouth 2 (two) times daily as needed for indigestion. Shake well.    Historical Provider, MD  amantadine (SYMMETREL) 100 MG capsule Take 1 capsule (100 mg total) by mouth 2 (two) times daily. Reported on 02/20/2015 02/21/15   Thermon Leyland, NP  diphenhydrAMINE (BENADRYL) 25 MG tablet Take  50 mg by mouth at bedtime as needed for sleep.     Historical Provider, MD  fluconazole (DIFLUCAN) 200 MG tablet Take 1 tablet (200 mg total) by mouth daily. Patient not taking: Reported on 07/31/2015 06/06/15   Catarina Hartshorn, MD  FLUoxetine (PROZAC) 20 MG capsule Take 1 capsule (20 mg total) by mouth daily. Reported on 02/20/2015 04/15/15   Porfirio Oar, PA-C  Fluticasone-Salmeterol (ADVAIR) 250-50 MCG/DOSE AEPB Inhale 1 puff into the lungs 2 (two) times daily.    Historical Provider, MD  furosemide (LASIX) 40 MG tablet Take 1 tablet (40 mg total) by mouth 2 (two) times daily. 06/05/15   Catarina Hartshorn, MD  hydrOXYzine (ATARAX/VISTARIL) 10 MG tablet TAKE 1 TABLET(10 MG) BY  MOUTH THREE TIMES DAILY AS NEEDED 07/26/15   Ofilia Neas, PA-C  lactulose (CHRONULAC) 10 GM/15ML solution Take 15 mLs (10 g total) by mouth 2 (two) times daily. Patient not taking: Reported on 07/31/2015 06/05/15   Catarina Hartshorn, MD  lidocaine (LIDODERM) 5 % Place 1 patch onto the skin daily as needed (for area underarm for redness/swelling/pain.). Reported on 04/06/2015    Historical Provider, MD  lisdexamfetamine (VYVANSE) 50 MG capsule Take 50 mg by mouth daily.    Historical Provider, MD  meloxicam (MOBIC) 7.5 MG tablet Take 7.5 mg by mouth daily.  03/17/15   Historical Provider, MD  nicotine (NICODERM CQ - DOSED IN MG/24 HOURS) 21 mg/24hr patch Place 1 patch (21 mg total) onto the skin daily. 02/21/15   Thermon Leyland, NP  ondansetron (ZOFRAN) 4 MG tablet Take 1 tablet (4 mg total) by mouth every 8 (eight) hours as needed for nausea or vomiting. Patient not taking: Reported on 07/31/2015 04/06/15   Porfirio Oar, PA-C  ondansetron (ZOFRAN-ODT) 4 MG disintegrating tablet DISSOLVE 1 TABLET BY MOUTH EVERY 6 TO 8 HOURS AS NEEDED FOR NAUSEA 06/23/15   Ofilia Neas, PA-C  pantoprazole (PROTONIX) 40 MG tablet Take 1 tablet (40 mg total) by mouth daily. 06/05/15   Catarina Hartshorn, MD  rifaximin (XIFAXAN) 550 MG TABS tablet Take 1 tablet (550 mg total) by mouth 2 (two) times daily. 06/05/15   Catarina Hartshorn, MD  risperiDONE (RISPERDAL) 0.5 MG tablet Take 5 tablets (2.5 mg total) by mouth at bedtime. Patient taking differently: Take 1.5 mg by mouth at bedtime.  02/21/15   Thermon Leyland, NP  rizatriptan (MAXALT) 10 MG tablet Take 10 mg by mouth daily as needed for migraine. Reported on 02/20/2015 09/05/14   Historical Provider, MD  spironolactone (ALDACTONE) 100 MG tablet Take 1 tablet (100 mg total) by mouth 2 (two) times daily. 06/05/15   Catarina Hartshorn, MD  topiramate (TOPAMAX) 100 MG tablet Take 1 tablet (100 mg total) by mouth daily. 02/21/15   Thermon Leyland, NP   BP 104/69 mmHg  Pulse 91  Temp(Src) 98 F (36.7 C) (Oral)   Resp 16  Ht  (1.549 m)  Wt 193 lb (87.544 kg)  BMI 36.49 kg/m2  SpO2 96% Physical Exam  Constitutional: She is oriented to person, place, and time. She appears well-developed and well-nourished. No distress.  HENT:  Head: Normocephalic and atraumatic.  Right Ear: Tympanic membrane and ear canal normal.  Left Ear: Tympanic membrane and ear canal normal.  Nose: Nose normal.  Mouth/Throat: Uvula is midline, oropharynx is clear and moist and mucous membranes are normal.  Eyes: Pupils are equal, round, and reactive to light.  Neck: Normal range of motion. Neck supple.  Cardiovascular: Normal  rate and regular rhythm.   Pulmonary/Chest: Effort normal.  Abdominal: Soft.  No signs of abdominal distention + ecchymosis to abdominal wall, pt states this is chronic.  Musculoskeletal:  No LE swelling  Neurological: She is alert and oriented to person, place, and time.  Baseline tremor.   Skin: Skin is warm and dry. No rash noted. She is not diaphoretic.  Bruising to abdominal wall with pt states has been there for a couple of weeks.  Psychiatric: Judgment normal. Her mood appears anxious.  Nursing note and vitals reviewed.   ED Course  Procedures  DIAGNOSTIC STUDIES: Oxygen Saturation is 98% on RA, normal by my interpretation.  COORDINATION OF CARE: 10:07 PM-Will order EKG. Discussed treatment plan with pt at bedside and pt agreed to plan.   Labs Review Labs Reviewed  COMPREHENSIVE METABOLIC PANEL - Abnormal; Notable for the following:    Potassium 2.9 (*)    Chloride 99 (*)    BUN <5 (*)    Creatinine, Ser 0.41 (*)    Calcium 8.3 (*)    Total Protein 5.9 (*)    Albumin 2.9 (*)    AST 62 (*)    Alkaline Phosphatase 215 (*)    Total Bilirubin 3.0 (*)    All other components within normal limits  LIPID PANEL - Abnormal; Notable for the following:    HDL 36 (*)    All other components within normal limits  POTASSIUM - Abnormal; Notable for the following:    Potassium 2.4  (*)    All other components within normal limits  TSH  HEMOGLOBIN A1C    Imaging Review  I have personally reviewed and evaluated these images and lab results as part of my medical decision-making.   EKG Interpretation None      MDM   Final diagnoses:  Multiple sclerosis (HCC)  Multiple sclerosis (HCC)    The patients potassium is 2.4. I am concerned that with her diarrhea and low potassium that she will require admission- she also takes lasix. I spoke with Dr. Ophelia Charter, [facility] who does not feel that she meets inpatient criteria. I discussed the specifics of the patient psychiatric situation but she still recommends I check a Magnesium level and if this is low, replace magnesium and try to avoid admission.  Discussed with Dr. Cyndie Chime.  Magnesium level is 1.5, only mildly low. Will give 4 g IV magnesium, 40 meq or oral potassium and 4 runs of 10 mg IV potassium and then recheck the potassium level approx 2 hours later.  At end of shift, patient sign out to [facility], PA-C. IF the patient potassium has corrected, transfer back to [facility]. If it continues to be low, then pt will meet admission criteria. I suspect she will be able to go back to [facility]. The patient is IVC'd and not allowed to leave. Recheck potassium at 7 am.    Marlon Pel, PA-C 08/03/15 [id number]  Leta Baptist, MD 08/03/15 [phone]

## 2015-08-02 NOTE — Progress Notes (Addendum)
D Writer called [phone] ( at 1230)  per chart instructions and spoke with Operator # [phone], re need transport for pt to go from current address to [facility] ( [facility] 2' IVC status) fro already - scheduled MRI with and without brain and cervical spine. Pt incontinent in the middle of hallway of very large amount of foul-smelling urine.  A Pt notified transport on the way. Pt given 1 mg ativan po at 1234 per her request for anxiety. R Safety in place.

## 2015-08-02 NOTE — [facility] Group Notes (Signed)
Date:  08/02/2015 Time:  10:00AM-11:00AM  Group Topic/Focus:  The main focus of today's therapy group was to identify unhealthy coping techniques which led to hospitalization and to start looking at healthy coping skills to be learned to use instead in similar circumstances.  Motivational Interviewing was used to highlight patient ambivilence.  Scaling questions were asked to help define for patients where they are right now on the issue they had initially raised.  Participation Level:  Active  Participation Quality:  Attentive and Sharing  Affect:  Blunted  Cognitive:  Appropriate  Insight: Good  Engagement in Group:  Developing/Improving  Modes of Intervention:  Discussion and Motivational Interviewing  Additional Comments:  Pt was in and out of the room a few times to go to the restroom.  She stated that she has been dealing with frustrating people in her life that has led her to use the unhealthy coping skills of drinking, not exercising for her multiple sclerosis and thus having more pain.  She is going to have an MRI, then go to [facility] to start on an appropriate medication regimen.  She stated one of the first moves she needs to make is get rid of toxic people in her life.  Sarina Ser 08/02/2015, 12:28 PM

## 2015-08-02 NOTE — Progress Notes (Signed)
Received call from Liz, RN [facility] to inform of critical lab value. Patient's potassium level drawn at 1837 today resulted at 2.4. Patient had previous potassium level of 2.9 at 0619 on 08/02/15. She has been receiving KCL 20 meq BID since 08/01/15, however, the patient has been having episodes of diarrhea which may be contributing to her hypokalemia. The patient has a history of hypokalemia but 2.4 is lowest value since 07/31/15. Will send patient to [facility] ED for evaluation and treatment of hypokalemia. Report called to charge nurse Misty Stanley.    Plan: 1. Obtain EKG - EKG shows NSR 2. Send to ER for evaluation and treatment of hypokalemia.   Alberteen Sam, FNP-BC [facility] 08/02/15          8:52 PM  Reviewed the information documented and agree with the treatment plan.  Anne Black 08/03/2015 2:26 PM

## 2015-08-02 NOTE — Progress Notes (Signed)
Pt in MRI.

## 2015-08-02 NOTE — Progress Notes (Signed)
[facility] MD Progress Note  08/02/2015 11:32 AM Anne Black  MRN:  [id number] Subjective:  Patient reports " I am leaving tomorrow and I need Vyvanse when I discharge."  Objective:Anne Black is awake, alert and oriented X4. Patient placed on 1:1 for safety. RN reports patient has history of falls. Patient denies suicidal or homicidal ideation. Denies auditory or visual hallucination and does not appear to be responding to internal stimuli. Patient interacts well with staff and others. Patient reports she is medication compliant without mediation side effects. Patient reports " I need a copy of my MRI to take to [facility] with me on Monday." States her depression 5/10.  Reports good appetite and reports she is  resting well. Patient report she is excited regarding discharge  On tomorrow. Support, encouragement and reassurance was provided.    Principal Problem: Bipolar disorder, current episode mixed, moderate ([place]) Diagnosis:   Patient Active Problem List   Diagnosis Date Noted  . Bipolar disorder, current episode mixed, moderate ([place]) [F31.62] 08/01/2015  . Alcohol use disorder, severe, dependence ([place]) [F10.20] 07/31/2015  . Alcoholic cirrhosis ([place]) [W29.93] 05/31/2015  . Alcoholic hepatitis with ascites [K70.11] 05/31/2015  . Macrocytic anemia- due to alcohol abuse with normal B12 & folate levels [D53.9] 05/31/2015  . Severe protein-calorie malnutrition ([place]) [E43] 05/31/2015  . Hypokalemia [E87.6] 05/26/2015  . Encephalopathy, hepatic ([place]) [K72.90] 05/26/2015  . Anxiety [F41.9] 05/18/2015  . Sepsis ([place]) [A41.9] 05/18/2015  . UTI (lower urinary tract infection) [N39.0] 05/18/2015  . Stimulant abuse [F15.10] 12/27/2014  . Nicotine abuse [F19.10] 12/27/2014  . Hyperprolactinemia ([place]) [E22.1] 11/15/2014  . Dyslipidemia [E78.5]   . Noncompliance with therapeutic plan [Z91.11] 04/04/2013  . Unspecified hereditary and idiopathic peripheral neuropathy [G60.9] 05/01/2012  . GERD  (gastroesophageal reflux disease) [K21.9] 05/01/2012  . Osteoarthrosis, unspecified whether generalized or localized, involving lower leg [M17.9] 05/01/2012  . MS (multiple sclerosis) ([place]) [G35] 09/14/2011   Total Time spent with patient: 30 minutes  Past Psychiatric History: See Above  Past Medical History:  Past Medical History  Diagnosis Date  . Multiple sclerosis ([place])   . Arthritis   . Anxiety   . Bipolar disorder ([place])   . ADHD (attention deficit hyperactivity disorder)   . Migraine   . Osteoporosis     Past Surgical History  Procedure Laterality Date  . Cesarean section  [id number]  . Myringotomy with tube placement Bilateral   . Tonsillectomy     Family History:  Family History  Problem Relation Age of Onset  . Heart disease Mother   . Heart disease Father   . Hypertension Father    Family Psychiatric  History: See Above Social History:  History  Alcohol Use  . 4.8 oz/week  . 2 Glasses of wine, 2 Cans of beer, 4 Standard drinks or equivalent per week    Comment: "I'm a social drinker." "I drink a glass of wine nightly."     History  Drug Use No    Social History   Social History  . Marital Status: Divorced    Spouse Name: n/a  . Number of Children: 5  . Years of Education: College   Occupational History  . disabled     on social security disability for MS   Social History Main Topics  . Smoking status: Current Every Day Smoker -- 0.75 packs/day for 20 years    Types: Cigarettes  . Smokeless tobacco: Never Used     Comment: is cutting back, not in the  car, house  . Alcohol Use: 4.8 oz/week    2 Glasses of wine, 2 Cans of beer, 4 Standard drinks or equivalent per week     Comment: "I'm a social drinker." "I drink a glass of wine nightly."  . Drug Use: No  . Sexual Activity: Not Currently   Other Topics Concern  . None   Social History Narrative   Former Teaching laboratory technician.   Started graduate school in counseling, but didn't  finish when a good job came open.   Lives alone.   One daughter lives in [place].   Another daughter is in [place].   The other three live in [place], along with her mother.   Father lives in the [place], [place] area.   Additional Social History:    Pain Medications: Please see EPIC notes  Prescriptions: Risperdal, Prozac Over the Counter: Please see EPIC notes History of alcohol / drug use?: Yes Longest period of sobriety (when/how long): unknown Negative Consequences of Use: Financial, Legal, Personal relationships, Work / School Withdrawal Symptoms: Irritability, Nausea / Vomiting, Sweats, Tremors Name of Substance 1: alcohol  1 - Age of First Use: unknown 1 - Amount (size/oz): 3-4 "tall fruity beers" 1 - Frequency: daily 1 - Duration: ongoing 1 - Last Use / Amount: yesterday                  Sleep: Good  Appetite:  Fair  Current Medications: Current Facility-Administered Medications  Medication Dose Route Frequency Provider Last Rate Last Dose  . acetaminophen (TYLENOL) tablet 650 mg  650 mg Oral Q6H PRN Patrecia Pour, NP   650 mg at 07/31/15 1835  . albuterol (PROVENTIL HFA;VENTOLIN HFA) 108 (90 Base) MCG/ACT inhaler 2 puff  2 puff Inhalation Q6H PRN Patrecia Pour, NP      . alum & mag hydroxide-simeth (MAALOX/MYLANTA) 200-200-20 MG/5ML suspension 30 mL  30 mL Oral Q4H PRN Patrecia Pour, NP      . amantadine (SYMMETREL) capsule 100 mg  100 mg Oral BID Patrecia Pour, NP   100 mg at 08/02/15 0843  . diphenhydrAMINE (BENADRYL) capsule 50 mg  50 mg Oral QHS PRN Patrecia Pour, NP   50 mg at 08/01/15 2112  . FLUoxetine (PROZAC) capsule 20 mg  20 mg Oral Daily Patrecia Pour, NP   20 mg at 08/02/15 0844  . furosemide (LASIX) tablet 40 mg  40 mg Oral BID Patrecia Pour, NP   40 mg at 08/02/15 0843  . gabapentin (NEURONTIN) capsule 200 mg  200 mg Oral TID Patrecia Pour, NP   200 mg at 08/02/15 0843  . gi cocktail (Maalox,Lidocaine,Donnatal)  30 mL Oral BID PRN  Patrecia Pour, NP   30 mL at 08/02/15 0845  . hydrOXYzine (ATARAX/VISTARIL) tablet 25 mg  25 mg Oral Q6H PRN Patrecia Pour, NP      . loperamide (IMODIUM) capsule 2-4 mg  2-4 mg Oral PRN Patrecia Pour, NP      . LORazepam (ATIVAN) tablet 1 mg  1 mg Oral Q6H PRN Patrecia Pour, NP   1 mg at 08/01/15 2112  . magnesium hydroxide (MILK OF MAGNESIA) suspension 30 mL  30 mL Oral Daily PRN Patrecia Pour, NP      . meloxicam [facility]) tablet 7.5 mg  7.5 mg Oral Daily Patrecia Pour, NP   7.5 mg at 08/02/15 0842  . mometasone-formoterol (DULERA) 200-5 MCG/ACT inhaler 2 puff  2 puff  Inhalation BID Patrecia Pour, NP   2 puff at 08/02/15 [phone]  . multivitamin with minerals tablet 1 tablet  1 tablet Oral Daily Patrecia Pour, NP   1 tablet at 08/02/15 [phone]  . nicotine (NICODERM CQ - dosed in mg/24 hours) patch 21 mg  21 mg Transdermal Daily Patrecia Pour, NP   21 mg at 08/02/15 0845  . ondansetron (ZOFRAN-ODT) disintegrating tablet 4 mg  4 mg Oral Q6H PRN Patrecia Pour, NP      . pantoprazole (PROTONIX) EC tablet 40 mg  40 mg Oral Daily Patrecia Pour, NP   40 mg at 08/02/15 0843  . potassium chloride SA (K-DUR,KLOR-CON) CR tablet 20 mEq  20 mEq Oral BID Ursula Alert, MD   20 mEq at 08/01/15 1213  . rifaximin (XIFAXAN) tablet 550 mg  550 mg Oral BID Patrecia Pour, NP   550 mg at 08/02/15 0843  . risperiDONE (RISPERDAL) tablet 0.5 mg  0.5 mg Oral QHS Saramma Eappen, MD   0.5 mg at 08/01/15 2111  . spironolactone (ALDACTONE) tablet 100 mg  100 mg Oral BID Patrecia Pour, NP   100 mg at 08/02/15 0842  . thiamine (VITAMIN B-1) tablet 100 mg  100 mg Oral Daily Patrecia Pour, NP   100 mg at 08/02/15 0843  . topiramate (TOPAMAX) tablet 100 mg  100 mg Oral QHS Patrecia Pour, NP   100 mg at 08/01/15 2111    Lab Results:  Results for orders placed or performed during the hospital encounter of 07/31/15 (from the past 48 hour(s))  Comprehensive metabolic panel     Status: Abnormal   Collection Time: 08/02/15   6:19 AM  Result Value Ref Range   Sodium 136 135 - 145 mmol/L   Potassium 2.9 (L) 3.5 - 5.1 mmol/L   Chloride 99 (L) 101 - 111 mmol/L   CO2 31 22 - 32 mmol/L   Glucose, Bld 95 65 - 99 mg/dL   BUN <5 (L) 6 - 20 mg/dL   Creatinine, Ser 0.41 (L) 0.44 - 1.00 mg/dL   Calcium 8.3 (L) 8.9 - 10.3 mg/dL   Total Protein 5.9 (L) 6.5 - 8.1 g/dL   Albumin 2.9 (L) 3.5 - 5.0 g/dL   AST 62 (H) 15 - 41 U/L   ALT 46 14 - 54 U/L   Alkaline Phosphatase 215 (H) 38 - 126 U/L   Total Bilirubin 3.0 (H) 0.3 - 1.2 mg/dL   GFR calc non Af Amer >60 >60 mL/min   GFR calc Af Amer >60 >60 mL/min    Comment: (NOTE) The eGFR has been calculated using the CKD EPI equation. This calculation has not been validated in all clinical situations. eGFR's persistently <60 mL/min signify possible Chronic Kidney Disease.    Anion gap 6 5 - 15    Comment: Performed at [facility]  Lipid panel     Status: Abnormal   Collection Time: 08/02/15  6:19 AM  Result Value Ref Range   Cholesterol 119 0 - 200 mg/dL   Triglycerides 68 <150 mg/dL   HDL 36 (L) >40 mg/dL   Total CHOL/HDL Ratio 3.3 RATIO   VLDL 14 0 - 40 mg/dL   LDL Cholesterol 69 0 - 99 mg/dL    Comment:        Total Cholesterol/HDL:CHD Risk Coronary Heart Disease Risk Table  Men   Women  1/2 Average Risk   3.4   3.3  Average Risk       5.0   4.4  2 X Average Risk   9.6   7.1  3 X Average Risk  23.4   11.0        Use the calculated Patient Ratio above and the CHD Risk Table to determine the patient's CHD Risk.        ATP III CLASSIFICATION (LDL):  <100     mg/dL   Optimal  100-129  mg/dL   Near or Above                    Optimal  130-159  mg/dL   Borderline  160-189  mg/dL   High  >190     mg/dL   Very High Performed at [facility]   TSH     Status: None   Collection Time: 08/02/15  6:19 AM  Result Value Ref Range   TSH 3.771 0.350 - 4.500 uIU/mL    Comment: Performed at [facility]     Blood Alcohol level:  Lab Results  Component Value Date   [facility] 196* 07/31/2015   ETH <5 16/96/7893    Metabolic Disorder Labs: Lab Results  Component Value Date   HGBA1C 5.5 11/02/2014   MPG 111 11/02/2014   Lab Results  Component Value Date   PROLACTIN 26.3* 11/13/2014   Lab Results  Component Value Date   CHOL 119 08/02/2015   TRIG 68 08/02/2015   HDL 36* 08/02/2015   CHOLHDL 3.3 08/02/2015   VLDL 14 08/02/2015   LDLCALC 69 08/02/2015   LDLCALC 173* 11/02/2014    Physical Findings: AIMS: Facial and Oral Movements Muscles of Facial Expression: None, normal Lips and Perioral Area: None, normal Jaw: None, normal Tongue: None, normal,Extremity Movements Upper (arms, wrists, hands, fingers): None, normal Lower (legs, knees, ankles, toes): None, normal, Trunk Movements Neck, shoulders, hips: None, normal, Overall Severity Severity of abnormal movements (highest score from questions above): None, normal Incapacitation due to abnormal movements: None, normal Patient's awareness of abnormal movements (rate only patient's report): No Awareness, Dental Status Current problems with teeth and/or dentures?: No Does patient usually wear dentures?: No  CIWA:  CIWA-Ar Total: 8 COWS:  COWS Total Score: 4  Musculoskeletal: Strength & Muscle Tone: within normal limits Gait & Station: unsteady- using a walker for ambulation.  Patient leans: N/A  Psychiatric Specialty Exam: Physical Exam  Vitals reviewed. Constitutional: She is oriented to person, place, and time. She appears well-developed.  Musculoskeletal: Normal range of motion.  Neurological: She is alert and oriented to person, place, and time.  Psychiatric: She has a normal mood and affect. Her behavior is normal.    Review of Systems  Psychiatric/Behavioral: Positive for depression. Negative for suicidal ideas and hallucinations. The patient is nervous/anxious.   All other systems reviewed and are negative.    Blood pressure 108/70, pulse 100, temperature 98 F (36.7 C), temperature source Oral, resp. rate 16, height '5\' 1"'  (1.549 m), weight 87.544 kg (193 lb), SpO2 96 %.Body mass index is 36.49 kg/(m^2).  General Appearance: Casual  Eye Contact:  Fair  Speech:  Clear and Coherent  Volume:  Normal  Mood:  Depressed  Affect:  Congruent  Thought Process:  Coherent  Orientation:  Full (Time, Place, and Person)  Thought Content:  Hallucinations: None  Suicidal Thoughts:  No  Homicidal Thoughts:  No  Memory:  Immediate;   Fair Recent;   Fair Remote;   Fair  Judgement:  Intact  Insight:  Fair  Psychomotor Activity:  Restlessness  Concentration:  Concentration: Fair  Recall:  [employer] of Knowledge:  Fair  Language:  Fair  Akathisia:  No  Handed:  Right  AIMS (if indicated):     Assets:  Communication Skills Desire for Improvement Resilience Social Support  ADL's:  Intact  Cognition:  WNL  Sleep:  Number of Hours: 6.75     I agree with current treatment plan on 08/02/2015, Patient seen face-to-face for psychiatric evaluation follow-up, chart reviewed. Reviewed the information documented and agree with the treatment plan.  Treatment Plan Summary: Daily contact with patient to assess and evaluate symptoms and progress in treatment and Medication management  Continue Prozac 20 mgs for depression and Gabapentin 200 mg PO BID for mood stabilization Orders placed for potassium level to be collected 08/02/2015 Orders placed for MRI on 08/02/2015- results pending  Admit for crisis management and mood stabilization. Medication management to re-stabilize current mood symptoms Group counseling sessions for coping skills Medical consults as needed Review and reinstate any pertinent home medications for other health problems  Derrill Center, NP 08/02/2015, 11:32 AM  Reviewed the information documented and agree with the treatment plan.  Corrie Brannen [facility] 08/03/2015 2:29 PM

## 2015-08-03 LAB — POTASSIUM: POTASSIUM: 3.8 mmol/L (ref 3.5–5.1)

## 2015-08-03 MED ORDER — POTASSIUM CHLORIDE 10 MEQ/100ML IV SOLN
10.0000 meq | INTRAVENOUS | Status: AC
  Administered 2015-08-03 (×2): 10 meq via INTRAVENOUS
  Filled 2015-08-03 (×2): qty 100

## 2015-08-03 MED ORDER — POTASSIUM CHLORIDE CRYS ER 20 MEQ PO TBCR
40.0000 meq | EXTENDED_RELEASE_TABLET | Freq: Once | ORAL | Status: AC
  Administered 2015-08-03: 40 meq via ORAL
  Filled 2015-08-03: qty 2

## 2015-08-03 MED ORDER — LORAZEPAM 0.5 MG PO TABS
0.5000 mg | ORAL_TABLET | Freq: Once | ORAL | Status: AC
  Administered 2015-08-03: 0.5 mg via ORAL
  Filled 2015-08-03: qty 1

## 2015-08-03 MED ORDER — MAGNESIUM SULFATE 4 GM/100ML IV SOLN
4.0000 g | Freq: Once | INTRAVENOUS | Status: AC
  Administered 2015-08-03: 4 g via INTRAVENOUS
  Filled 2015-08-03: qty 100

## 2015-08-03 NOTE — Progress Notes (Signed)
D.  Pt to be transported to [facility].  Upon the start of shift Pt experienced diarrhea and incontinence and was in the process of being assisted to clean and shower when critical value was called.  The time of call was 1935 and the critical value was K 2.4.  This RN called doctor who was on call and left message.  The physician called back but called assessment by accident and call was transferred to unit but this RN had gone to assist the Pt.  Upon returning, NP was notified and immediate orders were received for stat EKG and to then send Pt to ED for infusion of Potassium.  Pt was informed of this and was agreeable with treatment plan.  Pt was asymptomatic during event and requested Ginger Ale which she was given. A.   Pt was transported to hospital by EMS with MHT sitter present.  Pt appeared in no acute distress.  EKG was placed on chart after NP reviewed.  R.  Pt remained alert and pleasant during process, no acute distress noted.

## 2015-08-03 NOTE — Progress Notes (Signed)
Anne Black has been sound asleep since returning to the unit. [name] does not awaken when this writer calls her name at the bedside. She is lying in her bed, supine, breathing regularly(20/min) with slow, even respirations. She is calm and restful. R Safety in plae and 1:1 cont.

## 2015-08-03 NOTE — ED Notes (Signed)
Report called to Adult Unit [facility].

## 2015-08-03 NOTE — Progress Notes (Signed)
[name] has had a hard time this evening...dealing with her increasingly overwhelming feelings of anxiety . She remains in her wheelchair ( due to her HIGH FALL RISK and inabiltiy to ambulate independently. She is focused on her neurology appt with Dr. Gaynell Face at [facility] tomorrow AM and demands that #1 [facility] drive her to this appt tomorrow AM and #2 thiw writer take her to her locker NOW and let her get her " pretty dreww and sandals out so I can wear it tomorrow". She is not willing and is unable to listen and hear this writers explanation of how and why this is not possible. This Clinical research associate got CN SF to meet with pt while these issues are addressed and pt allows CN to re explain this situation. Pt requested prn anxiety medication later and this nurse called practitioner and obtained order. A Pt given prn ativan  0.5 mg prn x1 for c/o anxiety. R Safety in place and 1:1 cont.

## 2015-08-03 NOTE — ED Provider Notes (Signed)
6:11 AM Patient signed out to me by Marlon Pel, PA-C.  Pt is here from behavioral health, under IVC.  Sent to ED due to hypokalemia.  Pt found to have K 2.4.  Hospitalist declined admission.  Mg also low.  Replaced in ED.  Plan for recheck at 7am.  Anticipate transfer back to behavioral health.    8:40 AM Pt resting comfortably.  Made aware of normal potassium and plan for return to behavioral health.  Pt advised to continue her prescribed supplements.  Pt verbalizes understanding.    Transfer back to [employer].  Dr Lucianne Muss accepting.    Trixie Dredge, PA-C 08/03/15 [address] PA-C 08/03/15 1010  Leta Baptist, MD 08/03/15 [phone]

## 2015-08-03 NOTE — Progress Notes (Signed)
D: Pt presents with anxious, depressed affect and mood.  She reports "I'm confused.  I can't focus."  Pt states her goal is "I wanted to go home."  Pt states "I had my MRI yesterday, spine, head and neck.  That was a Management consultant.  I have an appointment Monday at 0820.  I need to be on a disease modifying treatment."  Pt has been very focused on her appointment that is scheduled for tomorrow per pt.  She was becoming visibly irritable because she was afraid to miss her appointment.  She denies SI/HI, denies hallucinations, report bilateral hip pain of 5/10.  She was initially ambulating without assistive device. She attended evening group.   A: Introduced self to pt.  Actively listened to pt and offered support and encouragement.  Reassured pt that appointment will be addressed by treatment team.  Medication administered per order.  Positive coping skills encouraged and reinforced.  Pt instructed to use assistive device since her gait is unsteady and she is a high fall risk.  PRN medication administered for pain, sleep. 1:1 staff remains with pt for safety.    R: Pt used assistive device after speaking with Clinical research associate.  She was less anxious after reassurance from staff.  She verbally contracts for safety and reports she will inform staff of needs and concerns.  She is currently resting in her room with eyes closed.  Will continue to monitor and assess.

## 2015-08-03 NOTE — Progress Notes (Signed)
1:1 Anne Black is seen sitting in the dayroom in a wheelchair. She returned from the [facility] ED after undergoing IV and po potassium supplement ( her potassium Sat pm was 2.4).She says she is exhausted and she can barely hold her eyes opened. She says she was  " awake all night". A Sitter helped her complete her daily assessment. On it she wrote she denied SI today and she rated her depression, hopelessness and anxiety " 4/3/4", respectively. R Safety in place. 1:1 in place.

## 2015-08-04 LAB — HEMOGLOBIN A1C
HEMOGLOBIN A1C: 4.9 % (ref 4.8–5.6)
Mean Plasma Glucose: 94 mg/dL

## 2015-08-04 MED ORDER — ONDANSETRON 4 MG PO TBDP
4.0000 mg | ORAL_TABLET | Freq: Once | ORAL | Status: AC
  Administered 2015-08-04: 4 mg via ORAL
  Filled 2015-08-04 (×2): qty 1

## 2015-08-04 MED ORDER — AMANTADINE HCL 100 MG PO CAPS: 100.0000 mg | ORAL_CAPSULE | Freq: Two times a day (BID) | ORAL | Status: DC

## 2015-08-04 MED ORDER — FLUOXETINE HCL 20 MG PO CAPS: 20.0000 mg | ORAL_CAPSULE | Freq: Every day | ORAL | Status: DC

## 2015-08-04 MED ORDER — GABAPENTIN 100 MG PO CAPS: 200.0000 mg | ORAL_CAPSULE | Freq: Three times a day (TID) | ORAL | Status: DC

## 2015-08-04 MED ORDER — RISPERIDONE 0.5 MG PO TABS: 0.5000 mg | ORAL_TABLET | Freq: Every day | ORAL | Status: DC

## 2015-08-04 MED ORDER — HYDROXYZINE HCL 25 MG PO TABS: 25.0000 mg | ORAL_TABLET | Freq: Three times a day (TID) | ORAL | Status: DC | PRN

## 2015-08-04 MED ORDER — NICOTINE 21 MG/24HR TD PT24: 21.0000 mg | MEDICATED_PATCH | Freq: Every day | TRANSDERMAL | Status: DC

## 2015-08-04 MED ORDER — HYDROXYZINE HCL 25 MG PO TABS
25.0000 mg | ORAL_TABLET | Freq: Three times a day (TID) | ORAL | Status: DC | PRN
  Administered 2015-08-04: 25 mg via ORAL
  Filled 2015-08-04: qty 1

## 2015-08-04 MED ORDER — PAROXETINE HCL 30 MG PO TABS: 30.0000 mg | ORAL_TABLET | Freq: Every day | ORAL | Status: DC

## 2015-08-04 MED ORDER — FLUOXETINE HCL 10 MG PO CAPS: 30.0000 mg | ORAL_CAPSULE | Freq: Every day | ORAL | Status: DC

## 2015-08-04 NOTE — Progress Notes (Signed)
Pt is resting quietly in her room with eyes closed.  Respirations are even and unlabored.  Pt is in no apparent distress.  1:1 staff remains with pt for safety.  Assistive devices available in pt's room.  Will continue to monitor and assess.

## 2015-08-04 NOTE — Progress Notes (Signed)
Observation note: Pt anxious requesting prn med for anxiety. Writer obtained order from [place], NP., for Vistaril. Ordered placed. Pt observed in wheelchair for high fall risk. Pt safety maintained.

## 2015-08-04 NOTE — BHH Suicide Risk Assessment (Signed)
[facility] Discharge Suicide Risk Assessment   Principal Problem: Bipolar disorder, current episode mixed, moderate (HCC) Discharge Diagnoses:  Patient Active Problem List   Diagnosis Date Noted  . Bipolar disorder, current episode mixed, moderate (HCC) [F31.62] 08/01/2015  . Alcohol use disorder, severe, dependence (HCC) [F10.20] 07/31/2015  . Alcoholic cirrhosis (HCC) [K70.30] 05/31/2015  . Alcoholic hepatitis with ascites [K70.11] 05/31/2015  . Macrocytic anemia- due to alcohol abuse with normal B12 & folate levels [D53.9] 05/31/2015  . Severe protein-calorie malnutrition (HCC) [E43] 05/31/2015  . Hypokalemia [E87.6] 05/26/2015  . Encephalopathy, hepatic (HCC) [K72.90] 05/26/2015  . Anxiety [F41.9] 05/18/2015  . Sepsis (HCC) [A41.9] 05/18/2015  . UTI (lower urinary tract infection) [N39.0] 05/18/2015  . Stimulant abuse [F15.10] 12/27/2014  . Nicotine abuse [F19.10] 12/27/2014  . Hyperprolactinemia (HCC) [E22.1] 11/15/2014  . Dyslipidemia [E78.5]   . Noncompliance with therapeutic plan [Z91.11] 04/04/2013  . Unspecified hereditary and idiopathic peripheral neuropathy [G60.9] 05/01/2012  . GERD (gastroesophageal reflux disease) [K21.9] 05/01/2012  . Osteoarthrosis, unspecified whether generalized or localized, involving lower leg [M17.9] 05/01/2012  . MS (multiple sclerosis) (HCC) [G35] 09/14/2011    Total Time spent with patient: 30 minutes  Musculoskeletal: Strength & Muscle Tone: within normal limits Gait & Station: normal Patient leans: N/A  Psychiatric Specialty Exam: Review of Systems  Psychiatric/Behavioral: Positive for substance abuse. Negative for depression.  All other systems reviewed and are negative.   Blood pressure 120/89, pulse 91, temperature 98 F (36.7 C), temperature source Oral, resp. rate 16, height  (1.549 m), weight 87.544 kg (193 lb), SpO2 96 %.Body mass index is 36.49 kg/(m^2).  General Appearance: Casual  Eye Contact::  Fair  Speech:  Normal  Rate409  Volume:  Normal  Mood:  Euthymic  Affect:  Appropriate  Thought Process:  Goal Directed and Descriptions of Associations: Intact  Orientation:  Full (Time, Place, and Person)  Thought Content:  Logical  Suicidal Thoughts:  No  Homicidal Thoughts:  No  Memory:  Immediate;   Fair Recent;   Fair Remote;   Fair  Judgement:  Fair  Insight:  Fair  Psychomotor Activity:  Normal  Concentration:  Fair  Recall:  [employer] of Knowledge:Fair  Language: Fair  Akathisia:  No  Handed:  Right  AIMS (if indicated):     Assets:  Desire for Improvement  Sleep:  Number of Hours: 6.5  Cognition: WNL  ADL's:  Intact   Mental Status Per Nursing Assessment::   On Admission:     Demographic Factors:  Caucasian  Loss Factors: Decrease in vocational status  Historical Factors: Impulsivity  Risk Reduction Factors:   Positive therapeutic relationship  Continued Clinical Symptoms:  Alcohol/Substance Abuse/Dependencies Previous Psychiatric Diagnoses and Treatments Medical Diagnoses and Treatments/Surgeries  Cognitive Features That Contribute To Risk:  None    Suicide Risk:  Minimal: No identifiable suicidal ideation.  Patients presenting with no risk factors but with morbid ruminations; may be classified as minimal risk based on the severity of the depressive symptoms  Follow-up Information    Follow up with [facility] .   Why:  Clinical Social Worker spoke with Aggie Cosier in admissions. You have been accepted for admission but due to 3 previous attempts to pick you up for transport to facility, you must have transportation to facility if you decide to accept this admission.    Contact information:   [address] [place], [place] [address] Phone: [phone] Fax: [phone]      Follow up with [facility]  Dept of Neurology On 08/06/2015.   Why:  Appt on this date at 11:00AM with Ron Agee PA. Please arrive 30 minutes early to this appt. Thank you.    Contact information:    ATTN: Dr. Allena Earing [address] [facility] [address] [place], [place] [address] Phone: [phone] Fax: [phone]      Follow up with [facility].   Why:  Walk in between 8am-9am Monday through Friday for hospital follow-up/medication management/assessment for counseling and substance abuse outpatient services if interested.    Contact information:   [address], [place] [address] Phone: [phone] Fax: [phone]      Plan Of Care/Follow-up recommendations:  Activity:  regular Diet:  regular Tests:  as needed Other:  follow up with aftercare  Ory Elting, MD 08/04/2015, 10:58 AM

## 2015-08-04 NOTE — Discharge Summary (Signed)
Physician Discharge Summary Note  Patient:  Anne Black is an 55 y.o., female MRN:  [id number] DOB:  1960/10/29 Patient phone:  [phone] (home)  Patient address:   [phone] [place] [address],  Total Time spent with patient: 30 minutes  Date of Admission:  07/31/2015 Date of Discharge: 08/04/2015  Reason for Admission: Anne Black, 55 yo was IVC petitioned by her daughters and mother. "They said, I was suicidal and I'm not." She said that her main problem is her MS. [facility] manages her MS. She reports that she saw them last Oct 2016. "They wanted me to get MRI of my back and brain and I haven't been able to get it. My last one was 6 years ago. I am in so much pain and I know its because my MS is not cared for and so I drink alcohol so that I forget about my chronic pain." Patient's BAL was 196 on admission. Collateral information obtained from her friend Anne Black [phone] (cell), [phone] (home). 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 Per [place], "Anne Black's problem with alcohol worsened because her MS diagnosis is not properly taken care of. She substitutes alcohol so manage her pain.   Principal Problem: Bipolar disorder, current episode mixed, moderate (HCC) Discharge Diagnoses: Patient Active Problem List   Diagnosis Date Noted  . Bipolar disorder, current episode mixed, moderate (HCC) [F31.62] 08/01/2015  . Alcohol use disorder, severe, dependence (HCC) [F10.20] 07/31/2015  . Alcoholic cirrhosis (HCC) [K70.30] 05/31/2015  . Alcoholic hepatitis with ascites [K70.11] 05/31/2015  . Macrocytic anemia- due to alcohol abuse with normal B12 & folate levels [D53.9] 05/31/2015  . Severe protein-calorie malnutrition (HCC) [E43] 05/31/2015  . Hypokalemia [E87.6] 05/26/2015  . Encephalopathy, hepatic (HCC) [K72.90] 05/26/2015  . Anxiety [F41.9] 05/18/2015  . Sepsis (HCC) [A41.9] 05/18/2015  . UTI (lower urinary tract  infection) [N39.0] 05/18/2015  . Stimulant abuse [F15.10] 12/27/2014  . Nicotine abuse [F19.10] 12/27/2014  . Hyperprolactinemia (HCC) [E22.1] 11/15/2014  . Dyslipidemia [E78.5]   . Noncompliance with therapeutic plan [Z91.11] 04/04/2013  . Unspecified hereditary and idiopathic peripheral neuropathy [G60.9] 05/01/2012  . GERD (gastroesophageal reflux disease) [K21.9] 05/01/2012  . Osteoarthrosis, unspecified whether generalized or localized, involving lower leg [M17.9] 05/01/2012  . MS (multiple sclerosis) (HCC) [G35] 09/14/2011    Past Psychiatric History: See Above  Past Medical History:  Past Medical History  Diagnosis Date  . Multiple sclerosis (HCC)   . Arthritis   . Anxiety   . Bipolar disorder (HCC)   . ADHD (attention deficit hyperactivity disorder)   . Migraine   . Osteoporosis     Past Surgical History  Procedure Laterality Date  . Cesarean section  [id number]  . Myringotomy with tube placement Bilateral   . Tonsillectomy     Family History:  Family History  Problem Relation Age of Onset  . Heart disease Mother   . Heart disease Father   . Hypertension Father    Family Psychiatric  History: See Above Social History:  History  Alcohol Use  . 4.8 oz/week  . 2 Glasses of wine, 2 Cans of beer, 4 Standard drinks or equivalent per week    Comment: "I'm a social drinker." "I drink a glass of wine nightly."     History  Drug Use No    Social History   Social History  . Marital Status: Divorced    Spouse Name: n/a  . Number of Children: 5  .  Years of Education: College   Occupational History  . disabled     on social security disability for MS   Social History Main Topics  . Smoking status: Current Every Day Smoker -- 0.75 packs/day for 20 years    Types: Cigarettes  . Smokeless tobacco: Never Used     Comment: is cutting back, not in the car, house  . Alcohol Use: 4.8 oz/week    2 Glasses of wine, 2 Cans of beer, 4 Standard drinks or equivalent per  week     Comment: "I'm a social drinker." "I drink a glass of wine nightly."  . Drug Use: No  . Sexual Activity: Not Currently   Other Topics Concern  . None   Social History Narrative   Former Environmental consultant.   Started graduate school in counseling, but didn't finish when a good job came open.   Lives alone.   One daughter lives in [place].   Another daughter is in [place].   The other three live in [place], along with her mother.   Father lives in the [place], [place] area.    Hospital Course: Anne Black was admitted for Bipolar disorder, current episode mixed, moderate (HCC) crisis management.  Pt was treated discharged with the medications listed below under Medication List.  Medical problems were identified and treated as needed.  Home medications were restarted as appropriate.  Improvement was monitored by observation and Anne Black 's daily report of symptom reduction.  Emotional and mental status was monitored by daily self-inventory reports completed by Anne Black and clinical staff.         Anne Black was evaluated by the treatment team for stability and plans for continued recovery upon discharge. Anne Black 's motivation was an integral factor for scheduling further treatment. Employment, transportation, bed availability, health status, family support, and any pending legal issues were also considered during hospital stay. Pt was offered further treatment options upon discharge including but not limited to Residential, Intensive Outpatient, and Outpatient treatment.  Anne Black will follow up with the services as listed below under Follow Up Information.     Upon completion of this admission the patient was both mentally and medically stable for discharge denying suicidal/homicidal ideation, auditory/visual/tactile hallucinations, delusional thoughts and paranoia. Patient has follow-up appointment for Monday as [facility] with  Dr.Eckstein. Social Worker to reschedule.  Anne Black responded well to treatment with Prozac 20 mg, Neurontin 200 mg, Symmetrel 100 mg, and Risperdal 0.5mg  without adverse effects. Patient  demonstrated improvement without reported or observed adverse effects to the point of stability appropriate for outpatient management. Pertinent labs include: Magnesium 1.5, Potassium 2.4,- patient was admitted to emergence department for potassium and mag levels on 08/02/2015.CMP, Lipid Panel  36 (l), for which outpatient follow-up is necessary for lab recheck as mentioned below. Reviewed CBC, CMP, BAL, and UDS; all unremarkable aside from noted exceptions.   Physical Findings: AIMS: Facial and Oral Movements Muscles of Facial Expression: None, normal Lips and Perioral Area: None, normal Jaw: None, normal Tongue: None, normal,Extremity Movements Upper (arms, wrists, hands, fingers): None, normal Lower (legs, knees, ankles, toes): None, normal, Trunk Movements Neck, shoulders, hips: None, normal, Overall Severity Severity of abnormal movements (highest score from questions above): None, normal Incapacitation due to abnormal movements: None, normal Patient's awareness of abnormal movements (rate only patient's report): No Awareness, Dental Status Current problems with teeth and/or dentures?: No Does patient usually wear dentures?: No  CIWA:  CIWA-Ar Total: 4 COWS:  COWS Total Score: 4  Musculoskeletal: Strength & Muscle Tone: within normal limits Gait & Station: normal Patient leans: N/A  Psychiatric Specialty Exam: Physical Exam  Nursing note and vitals reviewed. Constitutional: She is oriented to person, place, and time. She appears well-developed.  HENT:  Head: Normocephalic.  Neck: Normal range of motion. Neck supple.  Musculoskeletal: Normal range of motion.  Neurological: She is alert and oriented to person, place, and time.  Skin: Skin is warm.  Psychiatric: She has a normal mood and  affect. Her behavior is normal.    Review of Systems  Psychiatric/Behavioral: Negative for suicidal ideas and hallucinations. Depression: stable. The patient is nervous/anxious.   All other systems reviewed and are negative.   Blood pressure 120/89, pulse 91, temperature 98 F (36.7 C), temperature source Oral, resp. rate 16, height 5\' 1"  (1.549 m), weight 87.544 kg (193 lb), SpO2 96 %.Body mass index is 36.49 kg/(m^2).    Have you used any form of tobacco in the last 30 days? (Cigarettes, Smokeless Tobacco, Cigars, and/or Pipes): Yes  Has this patient used any form of tobacco in the last 30 days? (Cigarettes, Smokeless Tobacco, Cigars, and/or Pipes)  No  Blood Alcohol level:  Lab Results  Component Value Date   [facility] 196* 07/31/2015   ETH <5 06/10/2015    Metabolic Disorder Labs:  Lab Results  Component Value Date   HGBA1C 5.5 11/02/2014   MPG 111 11/02/2014   Lab Results  Component Value Date   PROLACTIN 26.3* 11/13/2014   Lab Results  Component Value Date   CHOL 119 08/02/2015   TRIG 68 08/02/2015   HDL 36* 08/02/2015   CHOLHDL 3.3 08/02/2015   VLDL 14 08/02/2015   LDLCALC 69 08/02/2015   LDLCALC 173* 11/02/2014    See Psychiatric Specialty Exam and Suicide Risk Assessment completed by Attending Physician prior to discharge.  Discharge destination:  Home  Is patient on multiple antipsychotic therapies at discharge:  No   Has Patient had three or more failed trials of antipsychotic monotherapy by history:  No  Recommended Plan for Multiple Antipsychotic Therapies: NA  Discharge Instructions    Activity as tolerated - No restrictions    Complete by:  As directed      Diet general    Complete by:  As directed      Discharge instructions    Complete by:  As directed   Take all medications as prescribed. Keep all follow-up appointments as scheduled.  Do not consume alcohol or use illegal drugs while on prescription medications. Report any adverse effects from  your medications to your primary care provider promptly.  In the event of recurrent symptoms or worsening symptoms, call 911, a crisis hotline, or go to the nearest emergency department for evaluation.            Medication List    STOP taking these medications        diphenhydrAMINE 25 MG tablet  Commonly known as:  BENADRYL     fluconazole 200 MG tablet  Commonly known as:  DIFLUCAN     gi cocktail Susp suspension     lactulose 10 GM/15ML solution  Commonly known as:  CHRONULAC     lisdexamfetamine 50 MG capsule  Commonly known as:  VYVANSE     ondansetron 4 MG disintegrating tablet  Commonly known as:  ZOFRAN-ODT     ondansetron 4 MG tablet  Commonly known as:  ZOFRAN  rizatriptan 10 MG tablet  Commonly known as:  MAXALT     spironolactone 100 MG tablet  Commonly known as:  ALDACTONE      TAKE these medications      Indication   albuterol 108 (90 Base) MCG/ACT inhaler  Commonly known as:  PROVENTIL HFA;VENTOLIN HFA  Inhale 2 puffs into the lungs every 6 (six) hours as needed for wheezing.      amantadine 100 MG capsule  Commonly known as:  SYMMETREL  Take 1 capsule (100 mg total) by mouth 2 (two) times daily.   Indication:  Tiredness associated with Multiple Sclerosis     FLUoxetine 20 MG capsule  Commonly known as:  PROZAC  Take 1 capsule (20 mg total) by mouth daily.   Indication:  Manic-Depression, Depression     Fluticasone-Salmeterol 250-50 MCG/DOSE Aepb  Commonly known as:  ADVAIR  Inhale 1 puff into the lungs 2 (two) times daily.      furosemide 40 MG tablet  Commonly known as:  LASIX  Take 1 tablet (40 mg total) by mouth 2 (two) times daily.      gabapentin 100 MG capsule  Commonly known as:  NEURONTIN  Take 2 capsules (200 mg total) by mouth 3 (three) times daily.   Indication:  Agitation, Alcohol Withdrawal Syndrome, mood control     hydrOXYzine 25 MG tablet  Commonly known as:  ATARAX/VISTARIL  Take 1 tablet (25 mg total) by mouth  every 8 (eight) hours as needed for anxiety.   Indication:  Anxiety Neurosis     lidocaine 5 %  Commonly known as:  LIDODERM  Place 1 patch onto the skin daily as needed (for area underarm for redness/swelling/pain.). Reported on 04/06/2015      nicotine 21 mg/24hr patch  Commonly known as:  NICODERM CQ - dosed in mg/24 hours  Place 1 patch (21 mg total) onto the skin daily.   Indication:  Nicotine Addiction     pantoprazole 40 MG tablet  Commonly known as:  PROTONIX  Take 1 tablet (40 mg total) by mouth daily.   Indication:  Gastroesophageal Reflux Disease     rifaximin 550 MG Tabs tablet  Commonly known as:  XIFAXAN  Take 1 tablet (550 mg total) by mouth 2 (two) times daily.      risperiDONE 0.5 MG tablet  Commonly known as:  RISPERDAL  Take 1 tablet (0.5 mg total) by mouth at bedtime.      topiramate 100 MG tablet  Commonly known as:  TOPAMAX  Take 1 tablet (100 mg total) by mouth daily.   Indication:  Migraine Headache      ASK your doctor about these medications      Indication   meloxicam 7.5 MG tablet  Commonly known as:  MOBIC  Take 7.5 mg by mouth daily.            Follow-up Information    Follow up with [facility] .   Why:  Clinical Social Worker spoke with Aggie Cosier in admissions. You have been accepted for admission but due to 3 previous attempts to pick you up for transport to facility, you must have transportation to facility if you decide to accept this admission.    Contact information:   [address] [place], [place] [address] Phone: [phone] Fax: [phone]      Follow up with [facility] On 08/04/2015.   Why:  Appt on this date for Neurology appt.  Contact information:   ATTN: Dr. Allena Earing [address] [place], [place] [address] Phone: [phone] Fax: [phone]      Follow up with [facility].   Why:  Walk in between 8am-9am Monday through Friday for hospital follow-up/medication management/assessment for  counseling and substance abuse outpatient services if interested.    Contact information:   [address] [place] [address] Phone: [phone] Fax: [phone]      Follow-up recommendations:  Activity:  as tolerated Diet:  Heart Healthy as prdx by Primary Care Provider  Comments: Take all medications as prescribed. Keep all follow-up appointments as scheduled.  Do not consume alcohol or use illegal drugs while on prescription medications. Report any adverse effects from your medications to your primary care provider promptly.  In the event of recurrent symptoms or worsening symptoms, call 911, a crisis hotline, or go to the nearest emergency department for evaluation.   Signed: Oneta Rack, NP 08/04/2015, 9:21 AM

## 2015-08-04 NOTE — Plan of Care (Addendum)
Problem: Safety: Goal: Ability to remain free from injury will improve Outcome: Progressing Pt has not harmed self tonight.  She verbally contracts for safety

## 2015-08-04 NOTE — Progress Notes (Signed)
Patient did attend the evening speaker AA meeting.  

## 2015-08-04 NOTE — Progress Notes (Signed)
Pt is resting quietly in her bed with eyes closed.  Respirations are even and unlabored.  Pt is in no apparent distress.  1:1 staff remains with pt for safety.  Assistive devices are present in pt's room.  Will continue to monitor and assess.

## 2015-08-04 NOTE — Progress Notes (Signed)
  [facility] Adult Case Management Discharge Plan :  Will you be returning to the same living situation after discharge:  Yes,  home At discharge, do you have transportation home?: Yes,  mother (PT'S DISCHARGE RESCHEDULED FROM Sunday TO TODAY, 08/04/15. NEUROLOGY APPT RESCHEDULED BEING THAT PT MISSED HER APPT THIS MORNING -NEW LOCATION-SEE BELOW). NEW RELEASE SIGNED WITH NEW ADDRESS AND HAS BEEN SUBMITTED TO MEDICAL RECORDS).  Do you have the ability to pay for your medications: Yes,  [employer] and Medicare  Release of information consent forms completed and submitted to medical records by CSW. Patient to Follow up at: Follow-up Information    Follow up with [facility] .   Why:  Clinical Social Worker spoke with Aggie Cosier in admissions. You have been accepted for admission but due to 3 previous attempts to pick you up for transport to facility, you must have transportation to facility if you decide to accept this admission.    Contact information:   [address] [place], [place] [address] Phone: [phone] Fax: [phone]      Follow up with [facility] On 08/06/2015.   Why:  Appt on this date at 11:00AM with Ron Agee PA. Please arrive 30 minutes early to this appt. Thank you.    Contact information:   ATTN: Dr. Allena Earing [address] [facility] [address] [place], [place] [address] Phone: [phone] Fax: [phone]      Follow up with [facility].   Why:  Walk in between 8am-9am Monday through Friday for hospital follow-up/medication management/assessment for counseling and substance abuse outpatient services if interested.    Contact information:   [address], [place] [address] Phone: [phone] Fax: [phone]      Next level of care provider has access to [facility] Link:no  Safety Planning and Suicide Prevention discussed: Yes,  SPE completed with pt; contact attempts made with pt's friend. SPI pamphlet and Mobile Crisis information provided to pt.    Have you used any form of tobacco in the last 30 days? (Cigarettes, Smokeless Tobacco, Cigars, and/or Pipes): Yes  Has patient been referred to the Quitline?: Patient refused referral  Patient has been referred for addiction treatment: Yes  Smart, Janda Cargo LCSW 08/04/2015, 10:05 AM

## 2015-08-04 NOTE — Progress Notes (Signed)
Recreation Therapy Notes  Date: 06.26.2017 Time: 9:30am Location: [address] Group Room   Group Topic: Stress Management  Goal Area(s) Addresses:  Patient will actively participate in stress management techniques presented during session.   Behavioral Response: Did not attend.   Marykay Lex Merit Gadsby, LRT/CTRS        Kervens Roper L 08/04/2015 10:14 AM

## 2015-08-04 NOTE — Progress Notes (Addendum)
[facility] Post 1:1 Observation Documentation  For the first (8) hours following discontinuation of 1:1 precautions, a progress note entry by nursing staff should be documented at least every 2 hours, reflecting the patient's behavior, condition, mood, and conversation.  Use the progress notes for additional entries.  Time 1:1 discontinued:  10:55 am  Patient's Behavior:  Anxious   Patient's Condition:  Safety maintained. Pt observed using wheelchair. 15 minute checks performed for safety.  Patient's Conversation:  Soft, slow tone.  Anne Black L 08/04/2015, 1:30 PM

## 2015-08-04 NOTE — Progress Notes (Signed)
Anne Black was D/C from the unit to lobby accompanied by her mother.  She was pleasant and cooperative. Voiced no SI/HI or A/V halluciations.  Pt. Denies any pain or discomfort.  D/C instructions and medications reviewed with pt.  Pt. verbalized understanding of medications and d/c instructions.   All belongings from locker 16 and 2 items behind the curtain returned to pt. Q 15 min checks maintained until discharge.  She left the unit in no apparent distress.

## 2015-08-05 ENCOUNTER — Telehealth: Payer: Self-pay

## 2015-08-05 NOTE — Telephone Encounter (Signed)
per fax requests; ventolin 2 puffs QID;Advair 1 puff BID; Protonix 40mg  1 daily;  per fax requests:  Modafinil / Provigil 200mg ; 1 tab daily PRN

## 2015-08-06 ENCOUNTER — Other Ambulatory Visit: Payer: Self-pay | Admitting: Physician Assistant

## 2015-08-06 MED ORDER — FLUTICASONE-SALMETEROL 250-50 MCG/DOSE IN AEPB: 1.0000 | INHALATION_SPRAY | Freq: Two times a day (BID) | RESPIRATORY_TRACT | Status: DC

## 2015-08-06 MED ORDER — PANTOPRAZOLE SODIUM 40 MG PO TBEC: 40.0000 mg | DELAYED_RELEASE_TABLET | Freq: Every day | ORAL | Status: DC

## 2015-08-06 MED ORDER — ALBUTEROL SULFATE HFA 108 (90 BASE) MCG/ACT IN AERS: 2.0000 | INHALATION_SPRAY | Freq: Four times a day (QID) | RESPIRATORY_TRACT | Status: DC | PRN

## 2015-08-06 NOTE — Telephone Encounter (Signed)
Left message for pt to call back  °

## 2015-08-06 NOTE — Telephone Encounter (Signed)
Will refill asthma and GERD meds.  She will need to contact the original provider for provigil. Please let her know. Deliah Boston, MS, PA-C 9:27 AM, 08/06/2015

## 2015-08-18 NOTE — Telephone Encounter (Signed)
Patient called requesting medications for panic attacks. I told her these medications could not be called over the phone. I suggested she make an appointment to see Deliah Boston and discuss her issues with him. She understands and agrees. Phone call received 5 PM Sunday

## 2015-08-22 ENCOUNTER — Inpatient Hospital Stay (HOSPITAL_COMMUNITY)
Admission: EM | Admit: 2015-08-22 | Discharge: 2015-08-30 | DRG: 433 | Disposition: A | Discharge status: Skilled Nursing Facility | Payer: Managed Care, Other (non HMO) | Attending: Internal Medicine | Admitting: Internal Medicine

## 2015-08-22 ENCOUNTER — Emergency Department (HOSPITAL_COMMUNITY): Payer: Managed Care, Other (non HMO)

## 2015-08-22 ENCOUNTER — Encounter (HOSPITAL_COMMUNITY): Payer: Self-pay

## 2015-08-22 DIAGNOSIS — Z7951 Long term (current) use of inhaled steroids: Secondary | ICD-10-CM

## 2015-08-22 DIAGNOSIS — Z91128 Patient's intentional underdosing of medication regimen for other reason: Secondary | ICD-10-CM | POA: Diagnosis not present

## 2015-08-22 DIAGNOSIS — G35 Multiple sclerosis: Secondary | ICD-10-CM | POA: Diagnosis present

## 2015-08-22 DIAGNOSIS — T501X6A Underdosing of loop [high-ceiling] diuretics, initial encounter: Secondary | ICD-10-CM | POA: Diagnosis present

## 2015-08-22 DIAGNOSIS — E8770 Fluid overload, unspecified: Secondary | ICD-10-CM | POA: Diagnosis present

## 2015-08-22 DIAGNOSIS — K7030 Alcoholic cirrhosis of liver without ascites: Secondary | ICD-10-CM | POA: Diagnosis present

## 2015-08-22 DIAGNOSIS — R0902 Hypoxemia: Secondary | ICD-10-CM

## 2015-08-22 DIAGNOSIS — Z79899 Other long term (current) drug therapy: Secondary | ICD-10-CM | POA: Diagnosis not present

## 2015-08-22 DIAGNOSIS — F319 Bipolar disorder, unspecified: Secondary | ICD-10-CM | POA: Diagnosis present

## 2015-08-22 DIAGNOSIS — D6959 Other secondary thrombocytopenia: Secondary | ICD-10-CM | POA: Diagnosis present

## 2015-08-22 DIAGNOSIS — F1020 Alcohol dependence, uncomplicated: Secondary | ICD-10-CM

## 2015-08-22 DIAGNOSIS — K7031 Alcoholic cirrhosis of liver with ascites: Secondary | ICD-10-CM | POA: Diagnosis not present

## 2015-08-22 DIAGNOSIS — R1011 Right upper quadrant pain: Secondary | ICD-10-CM | POA: Diagnosis present

## 2015-08-22 DIAGNOSIS — Z8249 Family history of ischemic heart disease and other diseases of the circulatory system: Secondary | ICD-10-CM

## 2015-08-22 DIAGNOSIS — R32 Unspecified urinary incontinence: Secondary | ICD-10-CM | POA: Diagnosis present

## 2015-08-22 DIAGNOSIS — F909 Attention-deficit hyperactivity disorder, unspecified type: Secondary | ICD-10-CM | POA: Diagnosis present

## 2015-08-22 DIAGNOSIS — F10239 Alcohol dependence with withdrawal, unspecified: Secondary | ICD-10-CM | POA: Diagnosis present

## 2015-08-22 DIAGNOSIS — Y906 Blood alcohol level of 120-199 mg/100 ml: Secondary | ICD-10-CM | POA: Diagnosis present

## 2015-08-22 DIAGNOSIS — N390 Urinary tract infection, site not specified: Secondary | ICD-10-CM | POA: Diagnosis present

## 2015-08-22 DIAGNOSIS — E876 Hypokalemia: Secondary | ICD-10-CM | POA: Diagnosis present

## 2015-08-22 DIAGNOSIS — K7040 Alcoholic hepatic failure without coma: Secondary | ICD-10-CM | POA: Diagnosis present

## 2015-08-22 DIAGNOSIS — R188 Other ascites: Secondary | ICD-10-CM

## 2015-08-22 DIAGNOSIS — E871 Hypo-osmolality and hyponatremia: Secondary | ICD-10-CM | POA: Diagnosis present

## 2015-08-22 DIAGNOSIS — F419 Anxiety disorder, unspecified: Secondary | ICD-10-CM | POA: Diagnosis present

## 2015-08-22 DIAGNOSIS — F17200 Nicotine dependence, unspecified, uncomplicated: Secondary | ICD-10-CM | POA: Diagnosis present

## 2015-08-22 DIAGNOSIS — M810 Age-related osteoporosis without current pathological fracture: Secondary | ICD-10-CM | POA: Diagnosis present

## 2015-08-22 DIAGNOSIS — K7011 Alcoholic hepatitis with ascites: Principal | ICD-10-CM | POA: Diagnosis present

## 2015-08-22 LAB — CBC WITH DIFFERENTIAL/PLATELET
BASOS ABS: 0.1 10*3/uL (ref 0.0–0.1)
BASOS PCT: 0 %
Eosinophils Absolute: 0.2 10*3/uL (ref 0.0–0.7)
Eosinophils Relative: 1 %
HEMATOCRIT: 37.7 % (ref 36.0–46.0)
HEMOGLOBIN: 12.7 g/dL (ref 12.0–15.0)
LYMPHS PCT: 15 %
Lymphs Abs: 1.7 10*3/uL (ref 0.7–4.0)
MCH: 33.2 pg (ref 26.0–34.0)
MCHC: 33.7 g/dL (ref 30.0–36.0)
MCV: 98.7 fL (ref 78.0–100.0)
Monocytes Absolute: 1.1 10*3/uL — ABNORMAL HIGH (ref 0.1–1.0)
Monocytes Relative: 9 %
NEUTROS ABS: 8.2 10*3/uL — AB (ref 1.7–7.7)
NEUTROS PCT: 75 %
Platelets: 170 10*3/uL (ref 150–400)
RBC: 3.82 MIL/uL — ABNORMAL LOW (ref 3.87–5.11)
RDW: 16.8 % — ABNORMAL HIGH (ref 11.5–15.5)
WBC: 11.1 10*3/uL — ABNORMAL HIGH (ref 4.0–10.5)

## 2015-08-22 LAB — URINALYSIS, ROUTINE W REFLEX MICROSCOPIC
Glucose, UA: NEGATIVE mg/dL
Hgb urine dipstick: NEGATIVE
KETONES UR: 40 mg/dL — AB
NITRITE: POSITIVE — AB
PH: 6 (ref 5.0–8.0)
PROTEIN: 30 mg/dL — AB
Specific Gravity, Urine: 1.026 (ref 1.005–1.030)

## 2015-08-22 LAB — URINE MICROSCOPIC-ADD ON

## 2015-08-22 LAB — COMPREHENSIVE METABOLIC PANEL
ALBUMIN: 3.1 g/dL — AB (ref 3.5–5.0)
ALK PHOS: 206 U/L — AB (ref 38–126)
ALT: 33 U/L (ref 14–54)
ANION GAP: 12 (ref 5–15)
AST: 75 U/L — AB (ref 15–41)
BILIRUBIN TOTAL: 2.6 mg/dL — AB (ref 0.3–1.2)
CALCIUM: 7.8 mg/dL — AB (ref 8.9–10.3)
CO2: 30 mmol/L (ref 22–32)
Chloride: 94 mmol/L — ABNORMAL LOW (ref 101–111)
Creatinine, Ser: 0.32 mg/dL — ABNORMAL LOW (ref 0.44–1.00)
GFR calc Af Amer: >60 mL/min (ref >60)
GLUCOSE: 93 mg/dL (ref 65–99)
POTASSIUM: 2.7 mmol/L — AB (ref 3.5–5.1)
Sodium: 136 mmol/L (ref 135–145)
TOTAL PROTEIN: 6.3 g/dL — AB (ref 6.5–8.1)

## 2015-08-22 LAB — LIPASE, BLOOD: Lipase: 17 U/L (ref 11–51)

## 2015-08-22 LAB — ETHANOL: Alcohol, Ethyl (B): 172 mg/dL — ABNORMAL HIGH (ref <5)

## 2015-08-22 LAB — POC OCCULT BLOOD, ED: FECAL OCCULT BLD: NEGATIVE

## 2015-08-22 LAB — SALICYLATE LEVEL

## 2015-08-22 LAB — ACETAMINOPHEN LEVEL: ACETAMINOPHEN (TYLENOL), SERUM: 12 ug/mL (ref 10–30)

## 2015-08-22 MED ORDER — FOLIC ACID 1 MG PO TABS
1.0000 mg | ORAL_TABLET | Freq: Every day | ORAL | Status: DC
  Administered 2015-08-22 – 2015-08-30 (×7): 1 mg via ORAL
  Filled 2015-08-22 (×8): qty 1

## 2015-08-22 MED ORDER — MOMETASONE FURO-FORMOTEROL FUM 200-5 MCG/ACT IN AERO
2.0000 | INHALATION_SPRAY | Freq: Two times a day (BID) | RESPIRATORY_TRACT | Status: DC
  Administered 2015-08-23 – 2015-08-30 (×14): 2 via RESPIRATORY_TRACT
  Filled 2015-08-22: qty 8.8

## 2015-08-22 MED ORDER — PANTOPRAZOLE SODIUM 40 MG PO TBEC
40.0000 mg | DELAYED_RELEASE_TABLET | Freq: Every day | ORAL | Status: DC
  Administered 2015-08-22 – 2015-08-30 (×7): 40 mg via ORAL
  Filled 2015-08-22 (×8): qty 1

## 2015-08-22 MED ORDER — RISPERIDONE 0.5 MG PO TABS
0.5000 mg | ORAL_TABLET | Freq: Every day | ORAL | Status: DC
  Administered 2015-08-22 – 2015-08-29 (×8): 0.5 mg via ORAL
  Filled 2015-08-22 (×8): qty 1

## 2015-08-22 MED ORDER — VITAMIN B-1 100 MG PO TABS
100.0000 mg | ORAL_TABLET | Freq: Every day | ORAL | Status: DC
  Administered 2015-08-23 – 2015-08-30 (×6): 100 mg via ORAL
  Filled 2015-08-22 (×7): qty 1

## 2015-08-22 MED ORDER — THIAMINE HCL 100 MG/ML IJ SOLN
100.0000 mg | Freq: Every day | INTRAMUSCULAR | Status: DC
  Administered 2015-08-22: 100 mg via INTRAVENOUS
  Filled 2015-08-22: qty 2

## 2015-08-22 MED ORDER — FUROSEMIDE 40 MG PO TABS: 40.0000 mg | ORAL_TABLET | Freq: Two times a day (BID) | ORAL | Status: DC

## 2015-08-22 MED ORDER — OXYBUTYNIN CHLORIDE ER 5 MG PO TB24
5.0000 mg | ORAL_TABLET | Freq: Every day | ORAL | Status: DC
  Administered 2015-08-22 – 2015-08-29 (×8): 5 mg via ORAL
  Filled 2015-08-22 (×8): qty 1

## 2015-08-22 MED ORDER — SODIUM CHLORIDE 0.9% FLUSH
3.0000 mL | Freq: Two times a day (BID) | INTRAVENOUS | Status: DC
  Administered 2015-08-22 – 2015-08-30 (×15): 3 mL via INTRAVENOUS

## 2015-08-22 MED ORDER — ADULT MULTIVITAMIN W/MINERALS CH
1.0000 | ORAL_TABLET | Freq: Every day | ORAL | Status: DC
  Administered 2015-08-22 – 2015-08-30 (×6): 1 via ORAL
  Filled 2015-08-22 (×8): qty 1

## 2015-08-22 MED ORDER — ENOXAPARIN SODIUM 40 MG/0.4ML ~~LOC~~ SOLN
40.0000 mg | SUBCUTANEOUS | Status: DC
  Administered 2015-08-23 – 2015-08-29 (×7): 40 mg via SUBCUTANEOUS
  Filled 2015-08-22 (×8): qty 0.4

## 2015-08-22 MED ORDER — MORPHINE SULFATE (PF) 4 MG/ML IV SOLN
4.0000 mg | Freq: Once | INTRAVENOUS | Status: AC
Start: 2015-08-22 — End: 2015-08-22
  Administered 2015-08-22: 4 mg via INTRAVENOUS
  Filled 2015-08-22: qty 1

## 2015-08-22 MED ORDER — AMANTADINE HCL 100 MG PO CAPS
100.0000 mg | ORAL_CAPSULE | Freq: Two times a day (BID) | ORAL | Status: DC
  Administered 2015-08-22 – 2015-08-30 (×16): 100 mg via ORAL
  Filled 2015-08-22 (×18): qty 1

## 2015-08-22 MED ORDER — SPIRONOLACTONE 100 MG PO TABS
100.0000 mg | ORAL_TABLET | Freq: Every day | ORAL | Status: DC
  Administered 2015-08-23 – 2015-08-30 (×8): 100 mg via ORAL
  Filled 2015-08-22: qty 1
  Filled 2015-08-22: qty 4
  Filled 2015-08-22: qty 1
  Filled 2015-08-22: qty 4
  Filled 2015-08-22 (×3): qty 1
  Filled 2015-08-22: qty 4
  Filled 2015-08-22 (×2): qty 1
  Filled 2015-08-22 (×2): qty 4
  Filled 2015-08-22: qty 1

## 2015-08-22 MED ORDER — GABAPENTIN 100 MG PO CAPS
200.0000 mg | ORAL_CAPSULE | Freq: Three times a day (TID) | ORAL | Status: DC
  Administered 2015-08-22 – 2015-08-30 (×18): 200 mg via ORAL
  Filled 2015-08-22 (×21): qty 2

## 2015-08-22 MED ORDER — POTASSIUM CHLORIDE CRYS ER 20 MEQ PO TBCR
40.0000 meq | EXTENDED_RELEASE_TABLET | Freq: Once | ORAL | Status: AC
  Administered 2015-08-22: 40 meq via ORAL
  Filled 2015-08-22: qty 2

## 2015-08-22 MED ORDER — ONDANSETRON HCL 4 MG/2ML IJ SOLN
4.0000 mg | Freq: Four times a day (QID) | INTRAMUSCULAR | Status: DC | PRN
  Administered 2015-08-22 – 2015-08-29 (×2): 4 mg via INTRAVENOUS
  Filled 2015-08-22 (×2): qty 2

## 2015-08-22 MED ORDER — LORAZEPAM 1 MG PO TABS
1.0000 mg | ORAL_TABLET | Freq: Four times a day (QID) | ORAL | Status: AC | PRN
  Administered 2015-08-23 – 2015-08-24 (×4): 1 mg via ORAL
  Filled 2015-08-22 (×4): qty 1

## 2015-08-22 MED ORDER — LORAZEPAM 2 MG/ML IJ SOLN
1.0000 mg | Freq: Four times a day (QID) | INTRAMUSCULAR | Status: AC | PRN
  Administered 2015-08-22 – 2015-08-23 (×2): 1 mg via INTRAVENOUS
  Filled 2015-08-22 (×3): qty 1

## 2015-08-22 MED ORDER — FLUOXETINE HCL 20 MG PO CAPS
20.0000 mg | ORAL_CAPSULE | Freq: Every day | ORAL | Status: DC
  Administered 2015-08-23 – 2015-08-30 (×7): 20 mg via ORAL
  Filled 2015-08-22 (×8): qty 1

## 2015-08-22 MED ORDER — ALBUTEROL SULFATE (2.5 MG/3ML) 0.083% IN NEBU
3.0000 mL | INHALATION_SOLUTION | Freq: Four times a day (QID) | RESPIRATORY_TRACT | Status: DC | PRN
Start: 2015-08-22 — End: 2015-08-30

## 2015-08-22 MED ORDER — FUROSEMIDE 40 MG PO TABS
40.0000 mg | ORAL_TABLET | Freq: Every day | ORAL | Status: DC
  Administered 2015-08-23 – 2015-08-30 (×8): 40 mg via ORAL
  Filled 2015-08-22 (×8): qty 1

## 2015-08-22 MED ORDER — POTASSIUM CHLORIDE 10 MEQ/100ML IV SOLN
10.0000 meq | INTRAVENOUS | Status: AC
  Administered 2015-08-22 – 2015-08-23 (×3): 10 meq via INTRAVENOUS
  Filled 2015-08-22: qty 100

## 2015-08-22 MED ORDER — NICOTINE 21 MG/24HR TD PT24
21.0000 mg | MEDICATED_PATCH | Freq: Every day | TRANSDERMAL | Status: DC
  Administered 2015-08-22 – 2015-08-30 (×9): 21 mg via TRANSDERMAL
  Filled 2015-08-22 (×9): qty 1

## 2015-08-22 MED ORDER — HYDROXYZINE HCL 25 MG PO TABS
25.0000 mg | ORAL_TABLET | Freq: Three times a day (TID) | ORAL | Status: DC | PRN
  Administered 2015-08-23 – 2015-08-29 (×7): 25 mg via ORAL
  Filled 2015-08-22 (×8): qty 1

## 2015-08-22 MED ORDER — TOPIRAMATE 100 MG PO TABS
100.0000 mg | ORAL_TABLET | Freq: Every day | ORAL | Status: DC
  Administered 2015-08-22 – 2015-08-30 (×9): 100 mg via ORAL
  Filled 2015-08-22 (×9): qty 1

## 2015-08-22 NOTE — ED Notes (Signed)
Pt lpm [place] changed to 2 lpm per [facility] order.

## 2015-08-22 NOTE — ED Notes (Addendum)
Phone report given to RN. Patient sent upstairs with sandwich.

## 2015-08-22 NOTE — H&P (Signed)
History and Physical    Anne Black ZOX:[id number] DOB: [birth year] DOA: 08/22/2015   PCP: No primary care provider on file. Chief Complaint:  Chief Complaint  Patient presents with  . Abdominal Pain    HPI: Anne Black is a 55 y.o. female with medical history significant of ongoing EtOH abuse, cirrhosis, EtOH hepatitis in April, Ascites last drained in April.  She has not been taking her lasix due to incontinence of urine.  She presents to the ED with several week history of slowly worsening SOB, DOE, abdominal distention.  Insidious onset of symptoms, worsening, worse with exertion, better at rest.  She admits that she is still drinking heavily and wants to get into a 28 day facility program after her admission here.  ED Course: O2 sat drops to 89% on RA with ambulation.  EtOH > 170  Review of Systems: As per HPI otherwise 10 point review of systems negative.    Past Medical History  Diagnosis Date  . Multiple sclerosis (HCC)   . Arthritis   . Anxiety   . Bipolar disorder (HCC)   . ADHD (attention deficit hyperactivity disorder)   . Migraine   . Osteoporosis     Past Surgical History  Procedure Laterality Date  . Cesarean section  [id number]  . Myringotomy with tube placement Bilateral   . Tonsillectomy       reports that she has been smoking Cigarettes.  She has a 15 pack-year smoking history. She has never used smokeless tobacco. She reports that she drinks about 4.8 oz of alcohol per week. She reports that she does not use illicit drugs.  No Known Allergies  Family History  Problem Relation Age of Onset  . Heart disease Mother   . Heart disease Father   . Hypertension Father       Prior to Admission medications   Medication Sig Start Date End Date Taking? Authorizing Provider  albuterol (PROVENTIL HFA;VENTOLIN HFA) 108 (90 Base) MCG/ACT inhaler Inhale 2 puffs into the lungs every 6 (six) hours as needed for wheezing (Must return to clinic for future  refills.). 08/06/15  Yes Ofilia Neas, PA-C  amantadine (SYMMETREL) 100 MG capsule Take 1 capsule (100 mg total) by mouth 2 (two) times daily. 08/04/15  Yes Oneta Rack, NP  diphenhydrAMINE (BENADRYL) 25 MG tablet Take 25 mg by mouth every 6 (six) hours as needed for sleep.   Yes Historical Provider, MD  Fluticasone-Salmeterol (ADVAIR) 250-50 MCG/DOSE AEPB Inhale 1 puff into the lungs 2 (two) times daily. Must return to clinic for future refills. 08/06/15  Yes Ofilia Neas, PA-C  gabapentin (NEURONTIN) 100 MG capsule Take 2 capsules (200 mg total) by mouth 3 (three) times daily. 08/04/15  Yes Oneta Rack, NP  hydrOXYzine (ATARAX/VISTARIL) 25 MG tablet Take 1 tablet (25 mg total) by mouth every 8 (eight) hours as needed for anxiety. 08/04/15  Yes Oneta Rack, NP  MELATONIN PO Take 1 tablet by mouth daily as needed (sleep).   Yes Historical Provider, MD  oxybutynin (DITROPAN-XL) 5 MG 24 hr tablet Take 5 mg by mouth at bedtime.  08/06/15 08/05/16 Yes Historical Provider, MD  pantoprazole (PROTONIX) 40 MG tablet Take 1 tablet (40 mg total) by mouth daily. Must return to clinic for future refills. 08/06/15  Yes Ofilia Neas, PA-C  risperiDONE (RISPERDAL) 0.5 MG tablet Take 1 tablet (0.5 mg total) by mouth at bedtime. 08/04/15  Yes Oneta Rack, NP  FLUoxetine (PROZAC) 20  MG capsule  08/04/15   Historical Provider, MD  furosemide (LASIX) 40 MG tablet Take 1 tablet (40 mg total) by mouth 2 (two) times daily. Patient not taking: Reported on 08/22/2015 06/05/15   Catarina Hartshorn, MD  lidocaine (LIDODERM) 5 % Place 1 patch onto the skin daily as needed (for area underarm for redness/swelling/pain.). Reported on 04/06/2015    Historical Provider, MD  nicotine (NICODERM CQ - DOSED IN MG/24 HOURS) 21 mg/24hr patch Place 1 patch (21 mg total) onto the skin daily. Patient not taking: Reported on 08/22/2015 08/04/15   Oneta Rack, NP  ondansetron (ZOFRAN-ODT) 4 MG disintegrating tablet DIS 1 T ON THE TONGUE Q 6 TO  8 H PRF NAUSEA 07/29/15   Historical Provider, MD  rifaximin (XIFAXAN) 550 MG TABS tablet Take 1 tablet (550 mg total) by mouth 2 (two) times daily. Patient not taking: Reported on 08/22/2015 06/05/15   Catarina Hartshorn, MD  topiramate (TOPAMAX) 100 MG tablet Take 1 tablet (100 mg total) by mouth daily. 02/21/15   Thermon Leyland, NP    Physical Exam: Filed Vitals:   08/22/15 1719 08/22/15 1813 08/22/15 1907 08/22/15 2058  BP:  130/71  117/76  Pulse: 92 95  103  Temp:      TempSrc:      Resp: 17 21  23   Weight:   83.6 kg (184 lb 4.9 oz)   SpO2: 97% 97%  95%      Constitutional: NAD, calm, comfortable Eyes: PERRL, lids and conjunctivae normal ENMT: Mucous membranes are moist. Posterior pharynx clear of any exudate or lesions.Normal dentition.  Neck: normal, supple, no masses, no thyromegaly Respiratory: clear to auscultation bilaterally, no wheezing, no crackles. Normal respiratory effort. No accessory muscle use.  Cardiovascular: Regular rate and rhythm, no murmurs / rubs / gallops. No extremity edema. 2+ pedal pulses. No carotid bruits.  Abdomen: no tenderness, no masses palpated. No hepatosplenomegaly. Bowel sounds positive.  Musculoskeletal: no clubbing / cyanosis. No joint deformity upper and lower extremities. Good ROM, no contractures. Normal muscle tone.  Skin: no rashes, lesions, ulcers. No induration Neurologic: CN 2-12 grossly intact. Sensation intact, DTR normal. Strength 5/5 in all 4.  Psychiatric: Normal judgment and insight. Alert and oriented x 3. Normal mood.    Labs on Admission: I have personally reviewed following labs and imaging studies  CBC:  Recent Labs Lab 08/22/15 1610  WBC 11.1*  NEUTROABS 8.2*  HGB 12.7  HCT 37.7  MCV 98.7  PLT 170   Basic Metabolic Panel:  Recent Labs Lab 08/22/15 1610  NA 136  K 2.7*  CL 94*  CO2 30  GLUCOSE 93  BUN <5*  CREATININE 0.32*  CALCIUM 7.8*   GFR: Estimated Creatinine Clearance: 77.9 mL/min (by C-G formula based  on Cr of 0.32). Liver Function Tests:  Recent Labs Lab 08/22/15 1610  AST 75*  ALT 33  ALKPHOS 206*  BILITOT 2.6*  PROT 6.3*  ALBUMIN 3.1*    Recent Labs Lab 08/22/15 1610  LIPASE 17   No results for input(s): AMMONIA in the last 168 hours. Coagulation Profile: No results for input(s): INR, PROTIME in the last 168 hours. Cardiac Enzymes: No results for input(s): CKTOTAL, CKMB, CKMBINDEX, TROPONINI in the last 168 hours. BNP (last 3 results) No results for input(s): PROBNP in the last 8760 hours. HbA1C: No results for input(s): HGBA1C in the last 72 hours. CBG: No results for input(s): GLUCAP in the last 168 hours. Lipid Profile: No results for input(s):  CHOL, HDL, LDLCALC, TRIG, CHOLHDL, LDLDIRECT in the last 72 hours. Thyroid Function Tests: No results for input(s): TSH, T4TOTAL, FREET4, T3FREE, THYROIDAB in the last 72 hours. Anemia Panel: No results for input(s): VITAMINB12, FOLATE, FERRITIN, TIBC, IRON, RETICCTPCT in the last 72 hours. Urine analysis:    Component Value Date/Time   COLORURINE AMBER* 08/22/2015 1740   APPEARANCEUR CLOUDY* 08/22/2015 1740   LABSPEC 1.026 08/22/2015 1740   PHURINE 6.0 08/22/2015 1740   GLUCOSEU NEGATIVE 08/22/2015 1740   HGBUR NEGATIVE 08/22/2015 1740   BILIRUBINUR MODERATE* 08/22/2015 1740   BILIRUBINUR moderate* 06/19/2015 1906   KETONESUR 40* 08/22/2015 1740   KETONESUR trace (5)* 06/19/2015 1906   PROTEINUR 30* 08/22/2015 1740   PROTEINUR negative 06/19/2015 1906   UROBILINOGEN >=8.0 06/19/2015 1906   UROBILINOGEN 0.2 09/14/2011 2141   NITRITE POSITIVE* 08/22/2015 1740   NITRITE Negative 06/19/2015 1906   LEUKOCYTESUR SMALL* 08/22/2015 1740   Sepsis Labs: @LABRCNTIP (procalcitonin:4,lacticidven:4) )No results found for this or any previous visit (from the past 240 hour(s)).   Radiological Exams on Admission: Dg Chest 2 View  08/22/2015  CLINICAL DATA:  Fevers of unknown origin EXAM: CHEST  2 VIEW COMPARISON:   05/28/2015 FINDINGS: The heart size and mediastinal contours are within normal limits. Both lungs are clear. The visualized skeletal structures are unremarkable. IMPRESSION: No active cardiopulmonary disease. Electronically Signed   By: Alcide Clever M.D.   On: 08/22/2015 17:42    EKG: Independently reviewed.  Assessment/Plan Principal Problem:   Alcoholic hepatitis with ascites Active Problems:   Alcoholic cirrhosis (HCC)   Alcohol use disorder, severe, dependence (HCC)   Alcoholic cirrhosis of liver with ascites (HCC)    EtOH hepatitis with ascites -  Maddrey's is low at this point, getting INR to calculate  Ascites likely causing mechanical breathing obstruction and SOB  US paracentesis ordered  Restarting daily lasix and spironolactone  Repeat CMP in AM  EtOH abuse -  CIWA  SW consult to see about getting patient into a treatment center   DVT prophylaxis: Lovenox Code Status: Full Family Communication: No family in room Consults called: None Admission status: Admit to inpatient   Hillary Bow DO [facility] Pager [phone] from 7PM-7AM  If 7AM-7PM, please contact the day physician for the patient [email or web address] Password [facility]  08/22/2015, 9:13 PM

## 2015-08-22 NOTE — ED Notes (Signed)
IV established but unable to collect sufficient sample for blood ordered. Cheek reports will attempt blood draw.

## 2015-08-22 NOTE — ED Notes (Signed)
Pt transported to [facility]. Will administer potassium when returned.

## 2015-08-22 NOTE — ED Notes (Signed)
Nurse stated she is going to collect the blood

## 2015-08-22 NOTE — ED Provider Notes (Signed)
CSN: [id number]     Arrival date & time 08/22/15  1422 History   First MD Initiated Contact with Patient 08/22/15 1502     Chief Complaint  Patient presents with  . Abdominal Pain     (Consider location/radiation/quality/duration/timing/severity/associated sxs/prior Treatment) HPI Comments: Patient with PMH of substance abuse, cirrhosis, MS, bipolar, presents to the ED with a chief complaint of right upper quadrant abdominal pain. She reports having associated nausea and black stools.  She denies any fevers, chills, CP, vomiting, diarrhea, or dysuria.  She states that she drinks heavily, about 7 shots of vodka per day at least.  She denies any aggravating or alleviating factors.    Additionally, patient states that she has been having increasing SOB for the past several weeks.  She states that it has been progressively worsening, especially with ambulation.  The history is provided by the patient. No language interpreter was used.    Past Medical History  Diagnosis Date  . Multiple sclerosis (HCC)   . Arthritis   . Anxiety   . Bipolar disorder (HCC)   . ADHD (attention deficit hyperactivity disorder)   . Migraine   . Osteoporosis    Past Surgical History  Procedure Laterality Date  . Cesarean section  [id number]  . Myringotomy with tube placement Bilateral   . Tonsillectomy     Family History  Problem Relation Age of Onset  . Heart disease Mother   . Heart disease Father   . Hypertension Father    Social History  Substance Use Topics  . Smoking status: Current Every Day Smoker -- 0.75 packs/day for 20 years    Types: Cigarettes  . Smokeless tobacco: Never Used     Comment: is cutting back, not in the car, house  . Alcohol Use: 4.8 oz/week    2 Glasses of wine, 2 Cans of beer, 4 Standard drinks or equivalent per week     Comment: "I'm a social drinker." "I drink a glass of wine nightly."   OB History    No data available     Review of Systems  Gastrointestinal:  Positive for nausea, abdominal pain and abdominal distention.  All other systems reviewed and are negative.     Allergies  Review of patient's allergies indicates no known allergies.  Home Medications   Prior to Admission medications   Medication Sig Start Date End Date Taking? Authorizing Provider  albuterol (PROVENTIL HFA;VENTOLIN HFA) 108 (90 Base) MCG/ACT inhaler Inhale 2 puffs into the lungs every 6 (six) hours as needed for wheezing (Must return to clinic for future refills.). 08/06/15   Ofilia Neas, PA-C  amantadine (SYMMETREL) 100 MG capsule Take 1 capsule (100 mg total) by mouth 2 (two) times daily. 08/04/15   Oneta Rack, NP  FLUoxetine (PROZAC) 10 MG capsule Take 3 capsules (30 mg total) by mouth daily. Reported on 02/20/2015 08/04/15   Oneta Rack, NP  Fluticasone-Salmeterol (ADVAIR) 250-50 MCG/DOSE AEPB Inhale 1 puff into the lungs 2 (two) times daily. Must return to clinic for future refills. 08/06/15   Ofilia Neas, PA-C  furosemide (LASIX) 40 MG tablet Take 1 tablet (40 mg total) by mouth 2 (two) times daily. 06/05/15   Catarina Hartshorn, MD  gabapentin (NEURONTIN) 100 MG capsule Take 2 capsules (200 mg total) by mouth 3 (three) times daily. 08/04/15   Oneta Rack, NP  hydrOXYzine (ATARAX/VISTARIL) 25 MG tablet Take 1 tablet (25 mg total) by mouth every 8 (eight) hours as needed  for anxiety. 08/04/15   Oneta Rack, NP  lidocaine (LIDODERM) 5 % Place 1 patch onto the skin daily as needed (for area underarm for redness/swelling/pain.). Reported on 04/06/2015    Historical Provider, MD  nicotine (NICODERM CQ - DOSED IN MG/24 HOURS) 21 mg/24hr patch Place 1 patch (21 mg total) onto the skin daily. 08/04/15   Oneta Rack, NP  pantoprazole (PROTONIX) 40 MG tablet Take 1 tablet (40 mg total) by mouth daily. Must return to clinic for future refills. 08/06/15   Ofilia Neas, PA-C  rifaximin (XIFAXAN) 550 MG TABS tablet Take 1 tablet (550 mg total) by mouth 2 (two) times daily.  06/05/15   Catarina Hartshorn, MD  risperiDONE (RISPERDAL) 0.5 MG tablet Take 1 tablet (0.5 mg total) by mouth at bedtime. 08/04/15   Oneta Rack, NP  topiramate (TOPAMAX) 100 MG tablet Take 1 tablet (100 mg total) by mouth daily. 02/21/15   Thermon Leyland, NP   BP 130/61 mmHg  Pulse 99  Temp(Src) 98.3 F (36.8 C) (Oral)  Resp 20  SpO2 92% Physical Exam  Constitutional: She is oriented to person, place, and time. She appears well-developed and well-nourished.  HENT:  Head: Normocephalic and atraumatic.  Eyes: Conjunctivae and EOM are normal. Pupils are equal, round, and reactive to light.  Neck: Normal range of motion. Neck supple.  Cardiovascular: Normal rate and regular rhythm.  Exam reveals no gallop and no friction rub.   No murmur heard. Pulmonary/Chest: Effort normal and breath sounds normal. No respiratory distress. She has no wheezes. She has no rales. She exhibits no tenderness.  Abdominal: Soft. Bowel sounds are normal. She exhibits distension. She exhibits no mass. There is no tenderness. There is no rebound and no guarding.  Large distended abdomen, no focal tenderness on exam  Musculoskeletal: Normal range of motion. She exhibits no edema or tenderness.  Neurological: She is alert and oriented to person, place, and time.  Skin: Skin is warm and dry.  Psychiatric: She has a normal mood and affect. Her behavior is normal. Judgment and thought content normal.  Nursing note and vitals reviewed.   ED Course  Procedures (including critical care time) Results for orders placed or performed during the hospital encounter of 08/22/15  Lipase, blood  Result Value Ref Range   Lipase 17 11 - 51 U/L  Comprehensive metabolic panel  Result Value Ref Range   Sodium 136 135 - 145 mmol/L   Potassium 2.7 (LL) 3.5 - 5.1 mmol/L   Chloride 94 (L) 101 - 111 mmol/L   CO2 30 22 - 32 mmol/L   Glucose, Bld 93 65 - 99 mg/dL   BUN <5 (L) 6 - 20 mg/dL   Creatinine, Ser 4.09 (L) 0.44 - 1.00 mg/dL    Calcium 7.8 (L) 8.9 - 10.3 mg/dL   Total Protein 6.3 (L) 6.5 - 8.1 g/dL   Albumin 3.1 (L) 3.5 - 5.0 g/dL   AST 75 (H) 15 - 41 U/L   ALT 33 14 - 54 U/L   Alkaline Phosphatase 206 (H) 38 - 126 U/L   Total Bilirubin 2.6 (H) 0.3 - 1.2 mg/dL   GFR calc non Af Amer >60 >60 mL/min   GFR calc Af Amer >60 >60 mL/min   Anion gap 12 5 - 15  Urinalysis, Routine w reflex microscopic  Result Value Ref Range   Color, Urine AMBER (A) YELLOW   APPearance CLOUDY (A) CLEAR   Specific Gravity, Urine 1.026 1.005 -  1.030   pH 6.0 5.0 - 8.0   Glucose, UA NEGATIVE NEGATIVE mg/dL   Hgb urine dipstick NEGATIVE NEGATIVE   Bilirubin Urine MODERATE (A) NEGATIVE   Ketones, ur 40 (A) NEGATIVE mg/dL   Protein, ur 30 (A) NEGATIVE mg/dL   Nitrite POSITIVE (A) NEGATIVE   Leukocytes, UA SMALL (A) NEGATIVE  CBC with Differential/Platelet  Result Value Ref Range   WBC 11.1 (H) 4.0 - 10.5 K/uL   RBC 3.82 (L) 3.87 - 5.11 MIL/uL   Hemoglobin 12.7 12.0 - 15.0 g/dL   HCT 16.1 09.6 - 04.5 %   MCV 98.7 78.0 - 100.0 fL   MCH 33.2 26.0 - 34.0 pg   MCHC 33.7 30.0 - 36.0 g/dL   RDW 40.9 (H) 81.1 - 91.4 %   Platelets 170 150 - 400 K/uL   Neutrophils Relative % 75 %   Neutro Abs 8.2 (H) 1.7 - 7.7 K/uL   Lymphocytes Relative 15 %   Lymphs Abs 1.7 0.7 - 4.0 K/uL   Monocytes Relative 9 %   Monocytes Absolute 1.1 (H) 0.1 - 1.0 K/uL   Eosinophils Relative 1 %   Eosinophils Absolute 0.2 0.0 - 0.7 K/uL   Basophils Relative 0 %   Basophils Absolute 0.1 0.0 - 0.1 K/uL  Ethanol  Result Value Ref Range   Alcohol, Ethyl (B) 172 (H) <5 mg/dL  Acetaminophen level  Result Value Ref Range   Acetaminophen (Tylenol), Serum 12 10 - 30 ug/mL  Salicylate level  Result Value Ref Range   Salicylate Lvl <4.0 2.8 - 30.0 mg/dL  Urine microscopic-add on  Result Value Ref Range   Squamous Epithelial / LPF TOO NUMEROUS TO COUNT (A) NONE SEEN   WBC, UA 0-5 0 - 5 WBC/hpf   RBC / HPF 0-5 0 - 5 RBC/hpf   Bacteria, UA FEW (A) NONE SEEN    Urine-Other MUCOUS PRESENT   POC occult blood, ED  Result Value Ref Range   Fecal Occult Bld NEGATIVE NEGATIVE     I have personally reviewed and evaluated these images and lab results as part of my medical decision-making.    MDM   Final diagnoses:  Right upper quadrant pain  Hypoxia  Alcoholism (HCC)    Patient with hx of alcohol abuse and cirrhosis, with RUQ pain.  No focal tenderness on exam. Will check labs and reassess.  Also has SOB which has been worsening for the past several weeks.  She is a heavy smoker.  States that she becomes very short of breath with exertion, but denies any chest pain.  Patient has been hypoxic between 84-89 on RA.  She maintains >95% on 2 L.  She states that she feels much better with the oxygen on.  Thought to be due to low lung volumes 2/2 ascites.  Discussed with Dr. Effie Shy, who recommends admission due to hypoxia and shortness of breath.  Otherwise, labs appear to be about baseline.  She is noted to be hypokalemic, which has been supplemented in the ED.  Appreciate Dr. Julian Reil for seeing the patient.  Anticipate admission.   Roxy Horseman, PA-C 08/22/15 2115  Mancel Bale, MD 08/23/15 [phone]

## 2015-08-22 NOTE — ED Notes (Signed)
Pt ambulated from bed to door O2 stat dropped to 89%

## 2015-08-22 NOTE — ED Notes (Addendum)
Pt with liver "problems" per family. Pt started having abdominal pain yesterday.  Nausea with no vomiting.  Unknown for fever.  No vaginal discharge or burning.  Pt with alcohol hx and has been drinking.

## 2015-08-23 ENCOUNTER — Inpatient Hospital Stay (HOSPITAL_COMMUNITY): Payer: Managed Care, Other (non HMO)

## 2015-08-23 DIAGNOSIS — R1011 Right upper quadrant pain: Secondary | ICD-10-CM | POA: Diagnosis not present

## 2015-08-23 DIAGNOSIS — K7011 Alcoholic hepatitis with ascites: Secondary | ICD-10-CM | POA: Diagnosis not present

## 2015-08-23 LAB — COMPREHENSIVE METABOLIC PANEL
ALBUMIN: 3.2 g/dL — AB (ref 3.5–5.0)
ALT: 33 U/L (ref 14–54)
AST: 78 U/L — AB (ref 15–41)
Alkaline Phosphatase: 200 U/L — ABNORMAL HIGH (ref 38–126)
Anion gap: 10 (ref 5–15)
CHLORIDE: 93 mmol/L — AB (ref 101–111)
CO2: 31 mmol/L (ref 22–32)
Calcium: 7.8 mg/dL — ABNORMAL LOW (ref 8.9–10.3)
Creatinine, Ser: <0.3 mg/dL — ABNORMAL LOW (ref 0.44–1.00)
Glucose, Bld: 83 mg/dL (ref 65–99)
POTASSIUM: 4 mmol/L (ref 3.5–5.1)
SODIUM: 134 mmol/L — AB (ref 135–145)
Total Bilirubin: 3.1 mg/dL — ABNORMAL HIGH (ref 0.3–1.2)
Total Protein: 6.6 g/dL (ref 6.5–8.1)

## 2015-08-23 LAB — CBC
HCT: 36.4 % (ref 36.0–46.0)
Hemoglobin: 11.9 g/dL — ABNORMAL LOW (ref 12.0–15.0)
MCH: 33 pg (ref 26.0–34.0)
MCHC: 32.7 g/dL (ref 30.0–36.0)
MCV: 100.8 fL — ABNORMAL HIGH (ref 78.0–100.0)
Platelets: 191 10*3/uL (ref 150–400)
RBC: 3.61 MIL/uL — ABNORMAL LOW (ref 3.87–5.11)
RDW: 16.8 % — AB (ref 11.5–15.5)
WBC: 16.4 10*3/uL — AB (ref 4.0–10.5)

## 2015-08-23 LAB — MRSA PCR SCREENING: MRSA by PCR: NEGATIVE

## 2015-08-23 MED ORDER — CHLORDIAZEPOXIDE HCL 25 MG PO CAPS
25.0000 mg | ORAL_CAPSULE | Freq: Three times a day (TID) | ORAL | Status: AC
  Administered 2015-08-24 – 2015-08-25 (×2): 25 mg via ORAL
  Filled 2015-08-23 (×3): qty 1

## 2015-08-23 MED ORDER — LOPERAMIDE HCL 2 MG PO CAPS: 2.0000 mg | ORAL_CAPSULE | ORAL | Status: AC | PRN

## 2015-08-23 MED ORDER — CHLORDIAZEPOXIDE HCL 25 MG PO CAPS
25.0000 mg | ORAL_CAPSULE | ORAL | Status: AC
  Administered 2015-08-25 (×2): 25 mg via ORAL
  Filled 2015-08-23 (×2): qty 1

## 2015-08-23 MED ORDER — CHLORDIAZEPOXIDE HCL 25 MG PO CAPS
25.0000 mg | ORAL_CAPSULE | Freq: Four times a day (QID) | ORAL | Status: AC
  Administered 2015-08-23 (×4): 25 mg via ORAL
  Filled 2015-08-23 (×4): qty 1

## 2015-08-23 MED ORDER — CHLORDIAZEPOXIDE HCL 25 MG PO CAPS
25.0000 mg | ORAL_CAPSULE | Freq: Every day | ORAL | Status: AC
  Administered 2015-08-26: 25 mg via ORAL
  Filled 2015-08-23: qty 1

## 2015-08-23 NOTE — Procedures (Signed)
   US guided RLQ paracentesis  2.7 Liters yellow fluid obtained Tolerated well

## 2015-08-23 NOTE — Progress Notes (Signed)
PROGRESS NOTE  Anne Black WUJ:[id number] DOB: November 26, 1960 DOA: 08/22/2015 PCP: No primary care provider on file.  HPI/Recap of past 24 hours: Anne Black is a 55 y.o. female with medical history significant of ongoing EtOH abuse, cirrhosis, EtOH hepatitis in April, Ascites last drained in April. She has not been taking her lasix due to incontinence of urine. She presents to the ED with several week history of slowly worsening SOB, DOE, abdominal distention.  She had paracentesis this morning, with 2.7 liters of fluid removed. She is a bit sleepy, says she last drank alcohol yesterday. She feels improved abdominal and respiratory discomfort after paracentesis.  Assessment/Plan: Principal Problem:   Alcoholic hepatitis with ascites - s/p paracentesis, no abd pain, confusion or fevers to raise concern for SBP. - cont aldactone/lasix Active Problems:   Alcoholic cirrhosis (HCC)   Alcohol use disorder, severe, dependence (HCC) - she will withdraw in the next 24-48 hours - placed on CIWA with lorazepam - start PO librium taper today 7/15 - PO Folate, MVI, thiamine   Alcoholic cirrhosis of liver with ascites (HCC)  Code Status: FULL   Family Communication: None present   Disposition Plan: Likely to rehab facility when stable for d/c. MSW consulted for this.    Consultants:  None   Procedures:  Paracentesis 7/15 with radiology, 2.7 liters removed   Antimicrobials:  None    Objective: Filed Vitals:   08/23/15 0808 08/23/15 0915 08/23/15 0927 08/23/15 0934  BP:  119/71 126/63 118/65  Pulse:      Temp:      TempSrc:      Resp:      Weight:      SpO2: 99%      No intake or output data in the 24 hours ending 08/23/15 1001 Filed Weights   08/22/15 1907  Weight: 83.6 kg (184 lb 4.9 oz)    Exam: General:  Sleepy but oriented, calm, in no acute distress Eyes: pupils round and reactive to light and accomodation, icteric sclerea Neck: supple, no masses, trachea  mildline  Cardiovascular: RRR, no murmurs or rubs, 2+ pitting BLE edema  Respiratory: clear to auscultation bilaterally, no wheezes, no crackles  Abdomen: soft, nontender, mildly distended, normal bowel tones heard  Skin: dry, no rashes  Musculoskeletal: no joint effusions, normal range of motion  Psychiatric: appropriate affect, normal speech  Neurologic: extraocular muscles intact, clear speech, moving all extremities with intact sensorium    Data Reviewed: CBC:  Recent Labs Lab 08/22/15 1610 08/23/15 0530  WBC 11.1* 16.4*  NEUTROABS 8.2*  --   HGB 12.7 11.9*  HCT 37.7 36.4  MCV 98.7 100.8*  PLT 170 191   Basic Metabolic Panel:  Recent Labs Lab 08/22/15 1610 08/23/15 0530  NA 136 134*  K 2.7* 4.0  CL 94* 93*  CO2 30 31  GLUCOSE 93 83  BUN <5* <5*  CREATININE 0.32* <0.30*  CALCIUM 7.8* 7.8*   GFR: CrCl cannot be calculated (Patient has no serum creatinine result on file.). Liver Function Tests:  Recent Labs Lab 08/22/15 1610 08/23/15 0530  AST 75* 78*  ALT 33 33  ALKPHOS 206* 200*  BILITOT 2.6* 3.1*  PROT 6.3* 6.6  ALBUMIN 3.1* 3.2*    Recent Labs Lab 08/22/15 1610  LIPASE 17   No results for input(s): AMMONIA in the last 168 hours. Coagulation Profile: No results for input(s): INR, PROTIME in the last 168 hours. Cardiac Enzymes: No results for input(s): CKTOTAL, CKMB, CKMBINDEX, TROPONINI  in the last 168 hours. BNP (last 3 results) No results for input(s): PROBNP in the last 8760 hours. HbA1C: No results for input(s): HGBA1C in the last 72 hours. CBG: No results for input(s): GLUCAP in the last 168 hours. Lipid Profile: No results for input(s): CHOL, HDL, LDLCALC, TRIG, CHOLHDL, LDLDIRECT in the last 72 hours. Thyroid Function Tests: No results for input(s): TSH, T4TOTAL, FREET4, T3FREE, THYROIDAB in the last 72 hours. Anemia Panel: No results for input(s): VITAMINB12, FOLATE, FERRITIN, TIBC, IRON, RETICCTPCT in the last 72 hours. Urine  analysis:    Component Value Date/Time   COLORURINE AMBER* 08/22/2015 1740   APPEARANCEUR CLOUDY* 08/22/2015 1740   LABSPEC 1.026 08/22/2015 1740   PHURINE 6.0 08/22/2015 1740   GLUCOSEU NEGATIVE 08/22/2015 1740   HGBUR NEGATIVE 08/22/2015 1740   BILIRUBINUR MODERATE* 08/22/2015 1740   BILIRUBINUR moderate* 06/19/2015 1906   KETONESUR 40* 08/22/2015 1740   KETONESUR trace (5)* 06/19/2015 1906   PROTEINUR 30* 08/22/2015 1740   PROTEINUR negative 06/19/2015 1906   UROBILINOGEN >=8.0 06/19/2015 1906   UROBILINOGEN 0.2 09/14/2011 2141   NITRITE POSITIVE* 08/22/2015 1740   NITRITE Negative 06/19/2015 1906   LEUKOCYTESUR SMALL* 08/22/2015 1740   Sepsis Labs: @LABRCNTIP (procalcitonin:4,lacticidven:4)  ) Recent Results (from the past 240 hour(s))  MRSA PCR Screening     Status: None   Collection Time: 08/22/15 11:30 PM  Result Value Ref Range Status   MRSA by PCR NEGATIVE NEGATIVE Final    Comment:        The GeneXpert MRSA Assay (FDA approved for NASAL specimens only), is one component of a comprehensive MRSA colonization surveillance program. It is not intended to diagnose MRSA infection nor to guide or monitor treatment for MRSA infections.       Studies: Dg Chest 2 View  08/22/2015  CLINICAL DATA:  Fevers of unknown origin EXAM: CHEST  2 VIEW COMPARISON:  05/28/2015 FINDINGS: The heart size and mediastinal contours are within normal limits. Both lungs are clear. The visualized skeletal structures are unremarkable. IMPRESSION: No active cardiopulmonary disease. Electronically Signed   By: Alcide Clever M.D.   On: 08/22/2015 17:42    Scheduled Meds: . amantadine  100 mg Oral BID  . chlordiazePOXIDE  25 mg Oral QID   Followed by  . [START ON 08/24/2015] chlordiazePOXIDE  25 mg Oral TID   Followed by  . [START ON 08/25/2015] chlordiazePOXIDE  25 mg Oral BH-qamhs   Followed by  . [START ON 08/26/2015] chlordiazePOXIDE  25 mg Oral Daily  . enoxaparin (LOVENOX) injection   40 mg Subcutaneous Q24H  . FLUoxetine  20 mg Oral Daily  . folic acid  1 mg Oral Daily  . furosemide  40 mg Oral Daily  . gabapentin  200 mg Oral TID  . mometasone-formoterol  2 puff Inhalation BID  . multivitamin with minerals  1 tablet Oral Daily  . nicotine  21 mg Transdermal Daily  . oxybutynin  5 mg Oral QHS  . pantoprazole  40 mg Oral Daily  . risperiDONE  0.5 mg Oral QHS  . sodium chloride flush  3 mL Intravenous Q12H  . spironolactone  100 mg Oral Daily  . thiamine  100 mg Oral Daily   Or  . thiamine  100 mg Intravenous Daily  . topiramate  100 mg Oral Daily    Continuous Infusions:    LOS: 1 day   Time spent: 21 minutes  Jourdain Guay Vergie Living, MD [facility] Pager [phone]  If 7PM-7AM, please  contact night-coverage [email or web address] Password [email or web address] 08/23/2015, 10:01 AM

## 2015-08-24 DIAGNOSIS — K7011 Alcoholic hepatitis with ascites: Secondary | ICD-10-CM | POA: Diagnosis not present

## 2015-08-24 MED ORDER — HALOPERIDOL LACTATE 5 MG/ML IJ SOLN
2.0000 mg | Freq: Four times a day (QID) | INTRAMUSCULAR | Status: DC | PRN
  Administered 2015-08-24: 2 mg via INTRAVENOUS
  Filled 2015-08-24: qty 1

## 2015-08-24 NOTE — Progress Notes (Signed)
Notified physician that patient was found pouring tea between her legs, continues to yell out of room, asking for her pocketbook, asking about a suitcase.  Patient denies pouring tea between her legs.  Reinforced to patient that the drink belongs on the table and not between her legs.  Asked patient to discontinue yelling as there are other patients on the hall and they are being disturbed by the loud voice.  Patient reports understanding.  Asked for another medication for patient.  Physician putting in order for Haldol.

## 2015-08-24 NOTE — Progress Notes (Signed)
PT Cancellation Note  Patient Details Name: Anne Black MRN: [id number] DOB: 04/08/1960   Cancelled Treatment:    Reason Eval/Treat Not Completed: Medical issues which prohibited therapy. Pt just medicated with haldol. Will hold PT and check back on tomorrow.    Rebeca Alert, MPT Pager: [phone]

## 2015-08-24 NOTE — Progress Notes (Signed)
PROGRESS NOTE  Anne Black ZOX:[id number] DOB: Jun 23, 1960 DOA: 08/22/2015 PCP: No primary care provider on file.  HPI/Recap of past 24 hours: Anne Black is a 55 y.o. female with medical history significant of ongoing EtOH abuse, cirrhosis, EtOH hepatitis in April, Ascites last drained in April. She has not been taking her lasix due to incontinence of urine. She presents to the ED with several week history of slowly worsening SOB, DOE, abdominal distention.  She had paracentesis 7/15, with 2.7 liters of fluid removed. Last ETOH 7/14.   Today she says she is doing decently well, denies chest pain, shortness of breath. She says she's ready to go home. She does want to go to alcohol rehab when she leaves the hospital.  Assessment/Plan: Principal Problem:   Alcoholic hepatitis with ascites - s/p paracentesis, no abd pain, confusion or fevers to raise concern for SBP. - cont aldactone/lasix Active Problems:   Alcoholic cirrhosis (HCC)   Alcohol use disorder, severe, dependence (HCC) - she will withdraw in the next 24-48 hours - placed on CIWA with lorazepam - cont PO librium taper started 7/15 - PO Folate, MVI, thiamine   Alcoholic cirrhosis of liver with ascites (HCC)  Code Status: FULL   Family Communication: None present   Disposition Plan: Likely to rehab facility when stable for d/c. MSW consulted for this.    Consultants:  None   Procedures:  Paracentesis 7/15 with radiology, 2.7 liters removed   Antimicrobials:  None    Objective: Filed Vitals:   08/24/15 0000 08/24/15 0320 08/24/15 0614 08/24/15 0910  BP:   100/65   Pulse:  109 97 110  Temp:   97.9 F (36.6 C)   TempSrc:   Oral   Resp:   18 20  Height: 5' (1.524 m)     Weight: 81.5 kg (179 lb 10.8 oz)     SpO2:   100% 100%   No intake or output data in the 24 hours ending 08/24/15 0937 Filed Weights   08/22/15 1907 08/24/15 0000  Weight: 83.6 kg (184 lb 4.9 oz) 81.5 kg (179 lb 10.8 oz)     Exam: General:  More awake this AM, sitting up eating breakfast, calm, in no acute distress Eyes: pupils round and reactive to light and accomodation, icteric sclerea Neck: supple, no masses, trachea mildline  Cardiovascular: RRR, no murmurs or rubs, 2+ pitting BLE edema  Respiratory: clear to auscultation bilaterally, no wheezes, no crackles  Abdomen: soft, nontender, mildly distended, normal bowel tones heard  Skin: dry, no rashes  Musculoskeletal: no joint effusions, normal range of motion  Psychiatric: appropriate affect, normal speech  Neurologic: extraocular muscles intact, clear speech, moving all extremities with intact sensorium    Data Reviewed: CBC:  Recent Labs Lab 08/22/15 1610 08/23/15 0530  WBC 11.1* 16.4*  NEUTROABS 8.2*  --   HGB 12.7 11.9*  HCT 37.7 36.4  MCV 98.7 100.8*  PLT 170 191   Basic Metabolic Panel:  Recent Labs Lab 08/22/15 1610 08/23/15 0530  NA 136 134*  K 2.7* 4.0  CL 94* 93*  CO2 30 31  GLUCOSE 93 83  BUN <5* <5*  CREATININE 0.32* <0.30*  CALCIUM 7.8* 7.8*   GFR: CrCl cannot be calculated (Patient has no serum creatinine result on file.). Liver Function Tests:  Recent Labs Lab 08/22/15 1610 08/23/15 0530  AST 75* 78*  ALT 33 33  ALKPHOS 206* 200*  BILITOT 2.6* 3.1*  PROT 6.3* 6.6  ALBUMIN 3.1*  3.2*    Recent Labs Lab 08/22/15 1610  LIPASE 17   No results for input(s): AMMONIA in the last 168 hours. Coagulation Profile: No results for input(s): INR, PROTIME in the last 168 hours. Cardiac Enzymes: No results for input(s): CKTOTAL, CKMB, CKMBINDEX, TROPONINI in the last 168 hours. BNP (last 3 results) No results for input(s): PROBNP in the last 8760 hours. HbA1C: No results for input(s): HGBA1C in the last 72 hours. CBG: No results for input(s): GLUCAP in the last 168 hours. Lipid Profile: No results for input(s): CHOL, HDL, LDLCALC, TRIG, CHOLHDL, LDLDIRECT in the last 72 hours. Thyroid Function Tests: No  results for input(s): TSH, T4TOTAL, FREET4, T3FREE, THYROIDAB in the last 72 hours. Anemia Panel: No results for input(s): VITAMINB12, FOLATE, FERRITIN, TIBC, IRON, RETICCTPCT in the last 72 hours. Urine analysis:    Component Value Date/Time   COLORURINE AMBER* 08/22/2015 1740   APPEARANCEUR CLOUDY* 08/22/2015 1740   LABSPEC 1.026 08/22/2015 1740   PHURINE 6.0 08/22/2015 1740   GLUCOSEU NEGATIVE 08/22/2015 1740   HGBUR NEGATIVE 08/22/2015 1740   BILIRUBINUR MODERATE* 08/22/2015 1740   BILIRUBINUR moderate* 06/19/2015 1906   KETONESUR 40* 08/22/2015 1740   KETONESUR trace (5)* 06/19/2015 1906   PROTEINUR 30* 08/22/2015 1740   PROTEINUR negative 06/19/2015 1906   UROBILINOGEN >=8.0 06/19/2015 1906   UROBILINOGEN 0.2 09/14/2011 2141   NITRITE POSITIVE* 08/22/2015 1740   NITRITE Negative 06/19/2015 1906   LEUKOCYTESUR SMALL* 08/22/2015 1740   Sepsis Labs: @LABRCNTIP (procalcitonin:4,lacticidven:4)  ) Recent Results (from the past 240 hour(s))  MRSA PCR Screening     Status: None   Collection Time: 08/22/15 11:30 PM  Result Value Ref Range Status   MRSA by PCR NEGATIVE NEGATIVE Final    Comment:        The GeneXpert MRSA Assay (FDA approved for NASAL specimens only), is one component of a comprehensive MRSA colonization surveillance program. It is not intended to diagnose MRSA infection nor to guide or monitor treatment for MRSA infections.       Studies: No results found.  Scheduled Meds: . amantadine  100 mg Oral BID  . chlordiazePOXIDE  25 mg Oral TID   Followed by  . [START ON 08/25/2015] chlordiazePOXIDE  25 mg Oral BH-qamhs   Followed by  . [START ON 08/26/2015] chlordiazePOXIDE  25 mg Oral Daily  . enoxaparin (LOVENOX) injection  40 mg Subcutaneous Q24H  . FLUoxetine  20 mg Oral Daily  . folic acid  1 mg Oral Daily  . furosemide  40 mg Oral Daily  . gabapentin  200 mg Oral TID  . mometasone-formoterol  2 puff Inhalation BID  . multivitamin with minerals   1 tablet Oral Daily  . nicotine  21 mg Transdermal Daily  . oxybutynin  5 mg Oral QHS  . pantoprazole  40 mg Oral Daily  . risperiDONE  0.5 mg Oral QHS  . sodium chloride flush  3 mL Intravenous Q12H  . spironolactone  100 mg Oral Daily  . thiamine  100 mg Oral Daily   Or  . thiamine  100 mg Intravenous Daily  . topiramate  100 mg Oral Daily    Continuous Infusions:    LOS: 2 days   Time spent: 22 minutes  Antrell Tipler Vergie Living, MD [facility] Pager [phone]  If 7PM-7AM, please contact night-coverage [email or web address] Password [facility] 08/24/2015, 9:37 AM

## 2015-08-24 NOTE — Clinical Social Work Note (Signed)
Clinical Social Work Assessment  Patient Details  Name: Anne Black MRN: [id number] Date of Birth: 08-Sep-1960  Date of referral:  08/24/15               Reason for consult:  Discharge Planning, Substance Use/ETOH Abuse                Permission sought to share information with:  Facility Sport and exercise psychologist, Family Supports Permission granted to share information::  Yes, Verbal Permission Granted  Name::     Mining engineer::  Drug rehabs  Relationship::     Contact Information:     Housing/Transportation Living arrangements for the past 2 months:  [place] of Information:  Patient Patient Interpreter Needed:  None Criminal Activity/Legal Involvement Pertinent to Current Situation/Hospitalization:  No - Comment as needed Significant Relationships:  Adult Children Lives with:  Self Do you feel safe going back to the place where you live?  Yes Need for family participation in patient care:  No (Coment)  Care giving concerns:  The patient reports that she has experienced two "bad" falls in the past year. She is concerned about falling again at home.     Social Worker assessment / plan:  CSW met with patient at bedside to complete assessment for alcohol use. The patient presence with severe alcohol use disorder. She shares that she lives at home alone but does have a supportive mother and daughters. CSW assessed the patient's orientation. She is able to give me her name and date of birth, and the year. However she believes she is at [facility] and wasn't able to articulate the reason for her hospitalization. CSW completed SBIRT with patient, for which she scored 16, indication a need for further evaluation and referral to treatment. The patient states that she is agreeable to treatment and specifically requests [facility]. CSW explained to the patient that we cannot guarantee that a bed will be available for her and that she may need to admit to a  treatment program from home. The patient verbalized understanding, but she will likely need to be remind of this given her fluctuating orientation. During assessment the patient pointed across the room and asked if CSW could hand her the suitcase. CSW explained that there is no suitcase in the room. The patient then stated "Doesn't that mummy look like a little kid?" CSW asked the patient if she has been experiencing any hallucinations. She states, "I can't remember, I think I did." CSW met with RN to discuss. CSW will continue to follow and assist with discharge needs when more appropriate.   Employment status:  Unemployed Forensic scientist:  Managed Care PT Recommendations:  Not assessed at this time Information / Referral to community resources:  Residential Substance Abuse Treatment Options, Outpatient Substance Abuse Treatment Options, SBIRT, Other (Comment Required) (Patient says she would like [facility] for treatment if possible.)  Patient/Family's Response to care:  The patient appears happy with the care she has received, however the unit staff share that the patient has been agitated this morning.  Patient/Family's Understanding of and Emotional Response to Diagnosis, Current Treatment, and Prognosis:  It's difficult to get an accurate assessment of the patient's understanding of her diagnosis and treatment plan at this time. She presented with flat affect and was limited in her engagement with CSW.  Emotional Assessment Appearance:  Appears stated age Attitude/Demeanor/Rapport:  Inconsistent Affect (typically observed):  Calm, Flat, Restless Orientation:  Oriented to Self, Oriented  to  Time Alcohol / Substance use:  Alcohol Use Psych involvement (Current and /or in the community):  No (Comment)  Discharge Needs  Concerns to be addressed:  Substance Abuse Concerns, Discharge Planning Concerns Readmission within the last 30 days:  Yes Current discharge risk:  Substance Abuse, Lives  alone Barriers to Discharge:  Continued Medical Work up   [place], LCSW 08/24/2015, 10:59 AM

## 2015-08-25 DIAGNOSIS — K7011 Alcoholic hepatitis with ascites: Secondary | ICD-10-CM | POA: Diagnosis not present

## 2015-08-25 MED ORDER — DEXTROSE 5 % IV SOLN
1.0000 g | INTRAVENOUS | Status: DC
  Administered 2015-08-25 – 2015-08-27 (×3): 1 g via INTRAVENOUS
  Filled 2015-08-25 (×3): qty 10

## 2015-08-25 NOTE — Evaluation (Signed)
Physical Therapy Evaluation Patient Details Name: Anne Black MRN: [id number] DOB: November 27, 1960 Today's Date: 08/25/2015   History of Present Illness  55 y.o. female adm with SOB and abd distention, ongoing ETOH;  PMHx: multiple sclerosis, bipolar disorder, depression and anxiety, and ETOH dependence presented with diarrhea  Clinical Impression  Pt admitted with above diagnosis. Pt currently with functional limitations due to the deficits listed below (see PT Problem List). * Pt will benefit from skilled PT to increase their independence and safety with mobility to allow discharge to the venue listed below.    Pt with overall disheveled appearance, continues to be lethargic but arousable, likely d/t meds; she states she only wants to go home and has no desire to go to rehab (physical or substance abuse); recommend SNF at this time, will continue to follow     Follow Up Recommendations SNF;Supervision/Assistance - 24 hour (unless progresses quickly)    Equipment Recommendations  None recommended by PT    Recommendations for Other Services       Precautions / Restrictions Precautions Precautions: Fall      Mobility  Bed Mobility Overal bed mobility: Needs Assistance Bed Mobility: Supine to Sit;Sit to Supine;Rolling Rolling: Mod assist   Supine to sit: Mod assist Sit to supine: Min assist   General bed mobility comments: multi-moal cues for sequence and participation; pt falls asleep without constatnt stimuli  Transfers                    Ambulation/Gait                Stairs            Wheelchair Mobility    Modified Rankin (Stroke Patients Only)       Balance Overall balance assessment: Needs assistance Sitting-balance support: Single extremity supported;Feet supported Sitting balance-Leahy Scale: Poor   Postural control: Posterior lean                                   Pertinent Vitals/Pain Pain Assessment: No/denies  pain    Home Living Family/patient expects to be discharged to:: Private residence Living Arrangements: Alone             Home Equipment: Environmental consultant - 2 wheels;Cane - single point Additional Comments: difficult to get accurate hx from pt    Prior Function Level of Independence: Independent with assistive device(s);Needs assistance      ADL's / Homemaking Assistance Needed: pt reports IND, then reports help "sometimes" from her mother or her dtr (??)  Comments: amb with walker or cane per  pt report--unsure of accuracy of this     Hand Dominance        Extremity/Trunk Assessment   Upper Extremity Assessment: Defer to OT evaluation;Generalized weakness           Lower Extremity Assessment: Generalized weakness         Communication   Communication: No difficulties  Cognition Arousal/Alertness: Lethargic;Suspect due to medications (sleepy arouses easily, difficulty maintaining) Behavior During Therapy: Flat affect Overall Cognitive Status: Impaired/Different from baseline Area of Impairment: Orientation;Following commands;Problem solving;Safety/judgement Orientation Level: Situation;Place;Time     Following Commands: Follows one step commands inconsistently Safety/Judgement: Decreased awareness of safety;Decreased awareness of deficits   Problem Solving: Slow processing;Difficulty sequencing;Requires verbal cues;Requires tactile cues      General Comments      Exercises  Assessment/Plan    PT Assessment Patient needs continued PT services  PT Diagnosis Generalized weakness;Altered mental status   PT Problem List Decreased activity tolerance;Decreased balance;Decreased mobility;Decreased cognition  PT Treatment Interventions DME instruction;Gait training;Functional mobility training;Therapeutic activities;Therapeutic exercise;Patient/family education   PT Goals (Current goals can be found in the Care Plan section) Acute Rehab PT Goals Patient  Stated Goal: to go home PT Goal Formulation: Patient unable to participate in goal setting Time For Goal Achievement: 09/08/15 Potential to Achieve Goals: Fair    Frequency Min 3X/week   Barriers to discharge        Co-evaluation               End of Session   Activity Tolerance: Patient limited by lethargy Patient left: in bed;with call bell/phone within reach;with bed alarm set Nurse Communication: Mobility status         Time: 1030-1044 PT Time Calculation (min) (ACUTE ONLY): 14 min   Charges:   PT Evaluation $PT Eval Moderate Complexity: 1 Procedure     PT G Codes:        [facility] 2015/09/24, 10:52 AM

## 2015-08-25 NOTE — Progress Notes (Signed)
PROGRESS NOTE  Anne Black GNF:[id number] DOB: 05/26/1960 DOA: 08/22/2015 PCP: No primary care provider on file.  HPI/Recap of past 24 hours: Anne Black is a 55 y.o. female with medical history significant of ongoing EtOH abuse, cirrhosis, EtOH hepatitis in April, Ascites last drained in April. She has not been taking her lasix due to incontinence of urine. She presents to the ED with several week history of slowly worsening SOB, DOE, abdominal distention.  She had paracentesis 7/15, with 2.7 liters of fluid removed. Last ETOH 7/14.   Today she says she is doing decently well, denies chest pain, shortness of breath. She is a little confused, is not sure what month it is. She has no acute complaints otherwise.   Assessment/Plan: Principal Problem:   Alcoholic hepatitis with ascites - s/p paracentesis, no abd pain, confusion or fevers to raise concern for SBP. - cont aldactone/lasix   Alcoholic cirrhosis (HCC)   Alcohol use disorder, severe, dependence (HCC) - she will continue to withdraw for the next 24-48 hours - placed on CIWA with lorazepam - cont PO librium taper started 7/15 - PO Folate, MVI, thiamine Alcoholic cirrhosis of liver with ascites (HCC)  Code Status: FULL   Family Communication: None present   Disposition Plan: Likely to rehab facility when stable for d/c. MSW consulted for this.    Consultants:  None   Procedures:  Paracentesis 7/15 with radiology, 2.7 liters removed   Antimicrobials:  None    Objective: Filed Vitals:   08/24/15 2327 08/25/15 0515 08/25/15 0635 08/25/15 0759  BP: 103/58 101/47 109/59   Pulse: 93 99 102   Temp: 98.7 F (37.1 C) 99 F (37.2 C) 98 F (36.7 C)   TempSrc: Oral Oral Oral   Resp: Height:      Weight:      SpO2: 99% 96% 97% 95%   No intake or output data in the 24 hours ending 08/25/15 1014 Filed Weights   08/22/15 1907 08/24/15 0000  Weight: 83.6 kg (184 lb 4.9 oz) 81.5 kg (179 lb 10.8 oz)     Exam: General:  She is awake this AM, sitting up eating breakfast, calm, in no acute distress, slight tremor noted Eyes: pupils round and reactive to light and accomodation, icteric sclerea Neck: supple, no masses, trachea mildline  Cardiovascular: RRR, no murmurs or rubs, 2+ pitting BLE edema  Respiratory: clear to auscultation bilaterally, no wheezes, no crackles  Abdomen: soft, nontender, mildly distended, normal bowel tones heard  Skin: dry, no rashes  Musculoskeletal: no joint effusions, normal range of motion  Psychiatric: appropriate affect, normal speech  Neurologic: extraocular muscles intact, clear speech, moving all extremities with intact sensorium    Data Reviewed: CBC:  Recent Labs Lab 08/22/15 1610 08/23/15 0530  WBC 11.1* 16.4*  NEUTROABS 8.2*  --   HGB 12.7 11.9*  HCT 37.7 36.4  MCV 98.7 100.8*  PLT 170 191   Basic Metabolic Panel:  Recent Labs Lab 08/22/15 1610 08/23/15 0530  NA 136 134*  K 2.7* 4.0  CL 94* 93*  CO2 30 31  GLUCOSE 93 83  BUN <5* <5*  CREATININE 0.32* <0.30*  CALCIUM 7.8* 7.8*   GFR: CrCl cannot be calculated (Patient has no serum creatinine result on file.). Liver Function Tests:  Recent Labs Lab 08/22/15 1610 08/23/15 0530  AST 75* 78*  ALT 33 33  ALKPHOS 206* 200*  BILITOT 2.6* 3.1*  PROT 6.3* 6.6  ALBUMIN 3.1*  3.2*    Recent Labs Lab 08/22/15 1610  LIPASE 17   No results for input(s): AMMONIA in the last 168 hours. Coagulation Profile: No results for input(s): INR, PROTIME in the last 168 hours. Cardiac Enzymes: No results for input(s): CKTOTAL, CKMB, CKMBINDEX, TROPONINI in the last 168 hours. BNP (last 3 results) No results for input(s): PROBNP in the last 8760 hours. HbA1C: No results for input(s): HGBA1C in the last 72 hours. CBG: No results for input(s): GLUCAP in the last 168 hours. Lipid Profile: No results for input(s): CHOL, HDL, LDLCALC, TRIG, CHOLHDL, LDLDIRECT in the last 72  hours. Thyroid Function Tests: No results for input(s): TSH, T4TOTAL, FREET4, T3FREE, THYROIDAB in the last 72 hours. Anemia Panel: No results for input(s): VITAMINB12, FOLATE, FERRITIN, TIBC, IRON, RETICCTPCT in the last 72 hours. Urine analysis:    Component Value Date/Time   COLORURINE AMBER* 08/22/2015 1740   APPEARANCEUR CLOUDY* 08/22/2015 1740   LABSPEC 1.026 08/22/2015 1740   PHURINE 6.0 08/22/2015 1740   GLUCOSEU NEGATIVE 08/22/2015 1740   HGBUR NEGATIVE 08/22/2015 1740   BILIRUBINUR MODERATE* 08/22/2015 1740   BILIRUBINUR moderate* 06/19/2015 1906   KETONESUR 40* 08/22/2015 1740   KETONESUR trace (5)* 06/19/2015 1906   PROTEINUR 30* 08/22/2015 1740   PROTEINUR negative 06/19/2015 1906   UROBILINOGEN >=8.0 06/19/2015 1906   UROBILINOGEN 0.2 09/14/2011 2141   NITRITE POSITIVE* 08/22/2015 1740   NITRITE Negative 06/19/2015 1906   LEUKOCYTESUR SMALL* 08/22/2015 1740   Sepsis Labs: @LABRCNTIP (procalcitonin:4,lacticidven:4)  ) Recent Results (from the past 240 hour(s))  MRSA PCR Screening     Status: None   Collection Time: 08/22/15 11:30 PM  Result Value Ref Range Status   MRSA by PCR NEGATIVE NEGATIVE Final    Comment:        The GeneXpert MRSA Assay (FDA approved for NASAL specimens only), is one component of a comprehensive MRSA colonization surveillance program. It is not intended to diagnose MRSA infection nor to guide or monitor treatment for MRSA infections.       Studies: No results found.  Scheduled Meds: . amantadine  100 mg Oral BID  . cefTRIAXone (ROCEPHIN)  IV  1 g Intravenous Q24H  . chlordiazePOXIDE  25 mg Oral BH-qamhs   Followed by  . [START ON 08/26/2015] chlordiazePOXIDE  25 mg Oral Daily  . enoxaparin (LOVENOX) injection  40 mg Subcutaneous Q24H  . FLUoxetine  20 mg Oral Daily  . folic acid  1 mg Oral Daily  . furosemide  40 mg Oral Daily  . gabapentin  200 mg Oral TID  . mometasone-formoterol  2 puff Inhalation BID  .  multivitamin with minerals  1 tablet Oral Daily  . nicotine  21 mg Transdermal Daily  . oxybutynin  5 mg Oral QHS  . pantoprazole  40 mg Oral Daily  . risperiDONE  0.5 mg Oral QHS  . sodium chloride flush  3 mL Intravenous Q12H  . spironolactone  100 mg Oral Daily  . thiamine  100 mg Oral Daily  . topiramate  100 mg Oral Daily    Continuous Infusions:    LOS: 3 days   Time spent: 24 minutes  Adain Geurin Vergie Living, MD [facility] Pager [phone]  If 7PM-7AM, please contact night-coverage [email or web address] Password [email or web address] 08/25/2015, 10:14 AM

## 2015-08-26 DIAGNOSIS — K7011 Alcoholic hepatitis with ascites: Secondary | ICD-10-CM | POA: Diagnosis not present

## 2015-08-26 LAB — URINE CULTURE

## 2015-08-26 LAB — AMMONIA: Ammonia: 92 umol/L — ABNORMAL HIGH (ref 9–35)

## 2015-08-26 MED ORDER — LACTULOSE 10 GM/15ML PO SOLN
10.0000 g | Freq: Three times a day (TID) | ORAL | Status: DC
  Administered 2015-08-26 – 2015-08-28 (×6): 10 g via ORAL
  Filled 2015-08-26 (×7): qty 15

## 2015-08-26 NOTE — Progress Notes (Signed)
LCSWA met with pt. mother Majorie Patient mother reports she is afraid for her daughter at this time due to patient current status. Patient is not alert or oriented. Patient is lethargic and confused. Patient mother explained the patient extensive history of behavioral health hospitals, and several attempts to complete substance abuse rehab. Discussed patient SNF recommendation at this time, but inability to make decisions at this time. LCSWA will continue to follow up with patient once more alert and stable.   Kathrin Greathouse, Latanya Presser, MSW Clinical Social Worker 5E and Psychiatric Service Line [phone] 08/26/2015  1:46 PM

## 2015-08-26 NOTE — Progress Notes (Signed)
PROGRESS NOTE  Anne Black NFA:[id number] DOB: March 22, 1960 DOA: 08/22/2015 PCP: No primary care provider on file.  HPI/Recap of past 24 hours: Anne Black is a 55 y.o. female with medical history significant of ongoing EtOH abuse, cirrhosis, EtOH hepatitis in April, Ascites last drained in April. She has not been taking her lasix due to incontinence of urine. She presents to the ED with several week history of slowly worsening SOB, DOE, abdominal distention.  She had paracentesis 7/15, with 2.7 liters of fluid removed. Last ETOH 7/14.   Today she says she is very somnolent, hasn't had sedating medication since yesterday. She seems less confused, just having a hard time waking up.   Assessment/Plan: Principal Problem:   Alcoholic hepatitis with ascites - s/p paracentesis, no abd pain, fevers to raise concern for SBP. - cont aldactone/lasix   Alcoholic cirrhosis (HCC)   Alcohol use disorder, severe, dependence (HCC) - she will continue to withdraw for the next 24-48 hours - placed on CIWA with lorazepam - cont PO librium taper started 7/15, I will jsut d/c this as she is getting to sedated. - check Ammonia level due to sedation - PO Folate, MVI, thiamine Alcoholic cirrhosis of liver with ascites (HCC)  Code Status: FULL   Family Communication: None present   Disposition Plan: Likely to rehab facility when stable for d/c, hope in next 24 hours. MSW consulted for this.    Consultants:  None   Procedures:  Paracentesis 7/15 with radiology, 2.7 liters removed   Antimicrobials:  None    Objective: Filed Vitals:   08/25/15 1432 08/25/15 2135 08/26/15 0641 08/26/15 0848  BP: 111/58 107/63 105/43   Pulse: 95 92 90   Temp: 97.9 F (36.6 C) 97.7 F (36.5 C) 97.6 F (36.4 C)   TempSrc: Oral Oral Oral   Resp: 20 18 19    Height:      Weight:      SpO2: 100% 100% 100% 96%    Intake/Output Summary (Last 24 hours) at 08/26/15 1203 Last data filed at 08/25/15  1700  Gross per 24 hour  Intake    450 ml  Output      0 ml  Net    450 ml   Filed Weights   08/22/15 1907 08/24/15 0000  Weight: 83.6 kg (184 lb 4.9 oz) 81.5 kg (179 lb 10.8 oz)    Exam: General:  She is sleepy this AM, able to state name and answer questions but goes back to sleep, calm, in no acute distress, slight tremor noted Eyes: pupils round and reactive to light and accomodation, icteric sclerea Neck: supple, no masses, trachea mildline  Cardiovascular: RRR, no murmurs or rubs, 2+ pitting BLE edema  Respiratory: clear to auscultation bilaterally, no wheezes, no crackles  Abdomen: soft, nontender, mildly distended, normal bowel tones heard  Skin: dry, no rashes  Musculoskeletal: no joint effusions, normal range of motion  Psychiatric: appropriate affect, normal speech  Neurologic: extraocular muscles intact, clear speech, moving all extremities with intact sensorium    Data Reviewed: CBC:  Recent Labs Lab 08/22/15 1610 08/23/15 0530  WBC 11.1* 16.4*  NEUTROABS 8.2*  --   HGB 12.7 11.9*  HCT 37.7 36.4  MCV 98.7 100.8*  PLT 170 191   Basic Metabolic Panel:  Recent Labs Lab 08/22/15 1610 08/23/15 0530  NA 136 134*  K 2.7* 4.0  CL 94* 93*  CO2 30 31  GLUCOSE 93 83  BUN <5* <5*  CREATININE 0.32* <  0.30*  CALCIUM 7.8* 7.8*   GFR: CrCl cannot be calculated (Patient has no serum creatinine result on file.). Liver Function Tests:  Recent Labs Lab 08/22/15 1610 08/23/15 0530  AST 75* 78*  ALT 33 33  ALKPHOS 206* 200*  BILITOT 2.6* 3.1*  PROT 6.3* 6.6  ALBUMIN 3.1* 3.2*    Recent Labs Lab 08/22/15 1610  LIPASE 17   No results for input(s): AMMONIA in the last 168 hours. Coagulation Profile: No results for input(s): INR, PROTIME in the last 168 hours. Cardiac Enzymes: No results for input(s): CKTOTAL, CKMB, CKMBINDEX, TROPONINI in the last 168 hours. BNP (last 3 results) No results for input(s): PROBNP in the last 8760 hours. HbA1C: No  results for input(s): HGBA1C in the last 72 hours. CBG: No results for input(s): GLUCAP in the last 168 hours. Lipid Profile: No results for input(s): CHOL, HDL, LDLCALC, TRIG, CHOLHDL, LDLDIRECT in the last 72 hours. Thyroid Function Tests: No results for input(s): TSH, T4TOTAL, FREET4, T3FREE, THYROIDAB in the last 72 hours. Anemia Panel: No results for input(s): VITAMINB12, FOLATE, FERRITIN, TIBC, IRON, RETICCTPCT in the last 72 hours. Urine analysis:    Component Value Date/Time   COLORURINE AMBER* 08/22/2015 1740   APPEARANCEUR CLOUDY* 08/22/2015 1740   LABSPEC 1.026 08/22/2015 1740   PHURINE 6.0 08/22/2015 1740   GLUCOSEU NEGATIVE 08/22/2015 1740   HGBUR NEGATIVE 08/22/2015 1740   BILIRUBINUR MODERATE* 08/22/2015 1740   BILIRUBINUR moderate* 06/19/2015 1906   KETONESUR 40* 08/22/2015 1740   KETONESUR trace (5)* 06/19/2015 1906   PROTEINUR 30* 08/22/2015 1740   PROTEINUR negative 06/19/2015 1906   UROBILINOGEN >=8.0 06/19/2015 1906   UROBILINOGEN 0.2 09/14/2011 2141   NITRITE POSITIVE* 08/22/2015 1740   NITRITE Negative 06/19/2015 1906   LEUKOCYTESUR SMALL* 08/22/2015 1740   Sepsis Labs: (procalcitonin:4,lacticidven:4)  ) Recent Results (from the past 240 hour(s))  MRSA PCR Screening     Status: None   Collection Time: 08/22/15 11:30 PM  Result Value Ref Range Status   MRSA by PCR NEGATIVE NEGATIVE Final    Comment:        The GeneXpert MRSA Assay (FDA approved for NASAL specimens only), is one component of a comprehensive MRSA colonization surveillance program. It is not intended to diagnose MRSA infection nor to guide or monitor treatment for MRSA infections.       Studies: No results found.  Scheduled Meds: . amantadine  100 mg Oral BID  . cefTRIAXone (ROCEPHIN)  IV  1 g Intravenous Q24H  . enoxaparin (LOVENOX) injection  40 mg Subcutaneous Q24H  . FLUoxetine  20 mg Oral Daily  . folic acid  1 mg Oral Daily  . furosemide  40 mg Oral  Daily  . gabapentin  200 mg Oral TID  . mometasone-formoterol  2 puff Inhalation BID  . multivitamin with minerals  1 tablet Oral Daily  . nicotine  21 mg Transdermal Daily  . oxybutynin  5 mg Oral QHS  . pantoprazole  40 mg Oral Daily  . risperiDONE  0.5 mg Oral QHS  . sodium chloride flush  3 mL Intravenous Q12H  . spironolactone  100 mg Oral Daily  . thiamine  100 mg Oral Daily  . topiramate  100 mg Oral Daily    Continuous Infusions:    LOS: 4 days   Time spent: 24 minutes  Fe Okubo Vergie Living, MD [facility] Pager [phone]  If 7PM-7AM, please contact night-coverage [email or web address] Password [facility] 08/26/2015, 12:03 PM

## 2015-08-26 NOTE — Discharge Summary (Deleted)
Discharge Summary  Anne Black WUJ:[id number] DOB: 04-03-1960  PCP: No primary care provider on file.  Admit date: 08/22/2015 Discharge date: 08/26/2015   Recommendations for Outpatient Follow-up:  1. PCP in 2 weeks, follow up EGD biopsy.   Discharge Diagnoses:  Active Hospital Problems   Diagnosis Date Noted  . Alcoholic hepatitis with ascites 05/31/2015  . Alcoholic cirrhosis of liver with ascites (HCC) 08/22/2015  . Alcohol use disorder, severe, dependence (HCC) 07/31/2015  . Alcoholic cirrhosis (HCC) 05/31/2015    Resolved Hospital Problems   Diagnosis Date Noted Date Resolved  No resolved problems to display.   Discharge Condition: Stable   Diet recommendation: Regular   Filed Vitals:   08/25/15 2135 08/26/15 0641  BP: 107/63 105/43  Pulse: 92 90  Temp: 97.7 F (36.5 C) 97.6 F (36.4 C)  Resp: 18 19    History of present illness:  Anne Black is a 55 y.o. female with medical history significant of ongoing EtOH abuse, cirrhosis, EtOH hepatitis in April, Ascites last drained in April. Admit with several week history of slowly worsening SOB, DOE, abdominal distention.  She had paracentesis 7/15, with 2.7 liters of fluid removed. Last ETOH 7/14.   Today she says she is doing decently well, denies chest pain, shortness of breath. She is a little confused, is not sure what month it is. She has no acute complaints otherwise.    Hospital Course:  Principal Problem:   Alcoholic hepatitis with ascites Active Problems:   Alcoholic cirrhosis (HCC)   Alcohol use disorder, severe, dependence (HCC)   Alcoholic cirrhosis of liver with ascites (HCC)  Principal Problem:  Alcoholic hepatitis with ascites - s/p paracentesis, no abd pain, confusion or fevers to raise concern for SBP. - cont aldactone/lasix - lactulose and rifaximin at home as well - strongly advised to abstain completely from EtOH - was placed on propranolol for varices, but this was d/c today as  she had none on EGD  Alcoholic cirrhosis (HCC)  Alcohol use disorder, severe, dependence (HCC) - she had mild Etoh withdrawal, she is now over it  - placed on CIWA with lorazepam and PO librium taper started 7/15 - PO Folate, MVI, thiamine at d/c  Alcoholic cirrhosis of liver with ascites (HCC)  Procedures:  EGD 7/17 with small gastric ulcer, no bleeding, no evidence of varices   Consultations:  [facility]   Discharge Exam: BP 105/43 mmHg  Pulse 90  Temp(Src) 97.6 F (36.4 C) (Oral)  Resp 19  Ht 5' (1.524 m)  Wt 81.5 kg (179 lb 10.8 oz)  BMI 35.09 kg/m2  SpO2 96% General:  Alert, oriented, calm, in no acute distress  Eyes: pupils round and reactive to light and accomodation, clear sclerea Neck: supple, no masses, trachea mildline  Cardiovascular: RRR, no murmurs or rubs, no peripheral edema  Respiratory: clear to auscultation bilaterally, no wheezes, no crackles  Abdomen: soft, nontender, nondistended, normal bowel tones heard  Skin: dry, no rashes  Musculoskeletal: no joint effusions, normal range of motion  Psychiatric: appropriate affect, normal speech  Neurologic: extraocular muscles intact, clear speech, moving all extremities with intact sensorium    Discharge Instructions You were cared for by a hospitalist during your hospital stay. If you have any questions about your discharge medications or the care you received while you were in the hospital after you are discharged, you can call the unit and asked to speak with the hospitalist on call if the hospitalist that took care of you  is not available. Once you are discharged, your primary care physician will handle any further medical issues. Please note that NO REFILLS for any discharge medications will be authorized once you are discharged, as it is imperative that you return to your primary care physician (or establish a relationship with a primary care physician if you do not have one) for your aftercare needs so  that they can reassess your need for medications and monitor your lab values.     Medication List    ASK your doctor about these medications        albuterol 108 (90 Base) MCG/ACT inhaler  Commonly known as:  PROVENTIL HFA;VENTOLIN HFA  Inhale 2 puffs into the lungs every 6 (six) hours as needed for wheezing (Must return to clinic for future refills.).     amantadine 100 MG capsule  Commonly known as:  SYMMETREL  Take 1 capsule (100 mg total) by mouth 2 (two) times daily.     diphenhydrAMINE 25 MG tablet  Commonly known as:  BENADRYL  Take 25 mg by mouth every 6 (six) hours as needed for sleep.     FLUoxetine 20 MG capsule  Commonly known as:  PROZAC     Fluticasone-Salmeterol 250-50 MCG/DOSE Aepb  Commonly known as:  ADVAIR  Inhale 1 puff into the lungs 2 (two) times daily. Must return to clinic for future refills.     furosemide 40 MG tablet  Commonly known as:  LASIX  Take 1 tablet (40 mg total) by mouth 2 (two) times daily.     gabapentin 100 MG capsule  Commonly known as:  NEURONTIN  Take 2 capsules (200 mg total) by mouth 3 (three) times daily.     hydrOXYzine 25 MG tablet  Commonly known as:  ATARAX/VISTARIL  Take 1 tablet (25 mg total) by mouth every 8 (eight) hours as needed for anxiety.     lidocaine 5 %  Commonly known as:  LIDODERM  Place 1 patch onto the skin daily as needed (for area underarm for redness/swelling/pain.). Reported on 04/06/2015     MELATONIN PO  Take 1 tablet by mouth daily as needed (sleep).     nicotine 21 mg/24hr patch  Commonly known as:  NICODERM CQ - dosed in mg/24 hours  Place 1 patch (21 mg total) onto the skin daily.     ondansetron 4 MG disintegrating tablet  Commonly known as:  ZOFRAN-ODT  DIS 1 T ON THE TONGUE Q 6 TO 8 H PRF NAUSEA     oxybutynin 5 MG 24 hr tablet  Commonly known as:  DITROPAN-XL  Take 5 mg by mouth at bedtime.     pantoprazole 40 MG tablet  Commonly known as:  PROTONIX  Take 1 tablet (40 mg total)  by mouth daily. Must return to clinic for future refills.     rifaximin 550 MG Tabs tablet  Commonly known as:  XIFAXAN  Take 1 tablet (550 mg total) by mouth 2 (two) times daily.     risperiDONE 0.5 MG tablet  Commonly known as:  RISPERDAL  Take 1 tablet (0.5 mg total) by mouth at bedtime.     topiramate 100 MG tablet  Commonly known as:  TOPAMAX  Take 1 tablet (100 mg total) by mouth daily.       No Known Allergies    The results of significant diagnostics from this hospitalization (including imaging, microbiology, ancillary and laboratory) are listed below for reference.    Significant Diagnostic Studies:  Dg Chest 2 View  08/22/2015  CLINICAL DATA:  Fevers of unknown origin EXAM: CHEST  2 VIEW COMPARISON:  05/28/2015 FINDINGS: The heart size and mediastinal contours are within normal limits. Both lungs are clear. The visualized skeletal structures are unremarkable. IMPRESSION: No active cardiopulmonary disease. Electronically Signed   By: Alcide Clever M.D.   On: 08/22/2015 17:42   Mr Laqueta Jean UJ Contrast  08/02/2015  CLINICAL DATA:  Initial evaluation for multiple sclerosis. EXAM: MRI HEAD WITHOUT AND WITH CONTRAST MRI CERVICAL SPINE WITHOUT AND WITH CONTRAST TECHNIQUE: Multiplanar, multiecho pulse sequences of the brain and surrounding structures, and cervical spine, to include the craniocervical junction and cervicothoracic junction, were obtained without and with intravenous contrast. CONTRAST:  18mL MULTIHANCE GADOBENATE DIMEGLUMINE 529 MG/ML IV SOLN COMPARISON:  None available. FINDINGS: MRI HEAD FINDINGS Study mildly degraded by motion artifact. Diffuse prominence of the CSF containing spaces is compatible with generalized cerebral atrophy, advanced for patient age. Multiple patchy and confluent T2/FLAIR hyperintense foci present within the periventricular, deep, and subcortical white matter of both cerebral hemispheres. A few of these foci are oriented perpendicular to the lateral  ventricles. There is involvement of the corpus callosum. Findings most likely related to provided history of underlying multiple sclerosis. No infratentorial lesions identified. No abnormal restricted diffusion or enhancement to suggest active demyelination. No evidence for acute infarct. Gray-white matter differentiation maintained. Major intracranial vascular flow voids are preserved. No acute or chronic intracranial hemorrhage. No areas of chronic infarction. No mass lesion, midline shift, or mass effect. No hydrocephalus. No extra-axial fluid collection. No abnormal enhancement. Craniocervical junction within normal limits. Visualized upper cervical spine unremarkable. Pituitary gland normal. No acute abnormality about the globes and orbits. Paranasal sinuses are clear. Minimal scattered opacity within the right mastoid air cells. Inner ear structures normal. Bone marrow signal intensity within normal limits. No scalp soft tissue abnormality. MRI CERVICAL SPINE FINDINGS Alignment: Vertebral bodies normally aligned with preservation of the normal cervical lordosis. No listhesis or subluxation. Vertebrae: Vertebral body heights well maintained. No evidence for acute or chronic fracture. Signal intensity within the vertebral body bone marrow is normal. Cord: Signal intensity within the cervical spinal cord is normal. No abnormal cord signal intensity to suggest underlying demyelinating disease. No abnormal enhancement. Posterior Fossa, vertebral arteries, paraspinal tissues: Visualized portions of the brain and posterior fossa are within normal limits. Craniocervical junction normal. Paraspinous soft tissues demonstrate no acute abnormality. No prevertebral or paraspinous edema. Normal intravascular flow voids present within the vertebral arteries bilaterally. Disc levels: C2-C3: Negative. C3-C4:  Negative. C4-C5: Mild degenerative disc bulge with bilateral uncovertebral hypertrophy. No significant stenosis. C5-C6:   Minimal bilateral uncovertebral spurring.  No stenosis. C6-C7: Broad-based posterior disc bulge with bilateral uncovertebral spurring. Minimal flattening of the ventral thecal sac without significant stenosis. Foramina remain patent. C7-T1:  Mild bilateral uncovertebral spurring without stenosis. Visualized portions of the upper thoracic spine demonstrate no acute abnormality. No significant stenosis. IMPRESSION: MRI BRAIN IMPRESSION: 1. Patchy and confluent cerebral white matter disease as above, most likely related to patient's history of underlying multiple sclerosis. No evidence for active demyelination. 2. Advanced cerebral atrophy for age, also likely related underlying demyelinating disease. 3. No other acute intracranial process identified. MRI CERVICAL SPINE IMPRESSION: 1. No MRI evidence for underlying demyelinating disease within the cervical spinal cord. 2. Shallow degenerative disc bulge at C6-7 without significant stenosis. 3. Additional relatively minor multilevel degenerative changes as above. No significant stenosis. Electronically Signed   By: Rise Mu  M.D.   On: 08/02/2015 17:17   Mr Cervical Spine W Wo Contrast  08/02/2015  CLINICAL DATA:  Initial evaluation for multiple sclerosis. EXAM: MRI HEAD WITHOUT AND WITH CONTRAST MRI CERVICAL SPINE WITHOUT AND WITH CONTRAST TECHNIQUE: Multiplanar, multiecho pulse sequences of the brain and surrounding structures, and cervical spine, to include the craniocervical junction and cervicothoracic junction, were obtained without and with intravenous contrast. CONTRAST:  18mL MULTIHANCE GADOBENATE DIMEGLUMINE 529 MG/ML IV SOLN COMPARISON:  None available. FINDINGS: MRI HEAD FINDINGS Study mildly degraded by motion artifact. Diffuse prominence of the CSF containing spaces is compatible with generalized cerebral atrophy, advanced for patient age. Multiple patchy and confluent T2/FLAIR hyperintense foci present within the periventricular, deep, and  subcortical white matter of both cerebral hemispheres. A few of these foci are oriented perpendicular to the lateral ventricles. There is involvement of the corpus callosum. Findings most likely related to provided history of underlying multiple sclerosis. No infratentorial lesions identified. No abnormal restricted diffusion or enhancement to suggest active demyelination. No evidence for acute infarct. Gray-white matter differentiation maintained. Major intracranial vascular flow voids are preserved. No acute or chronic intracranial hemorrhage. No areas of chronic infarction. No mass lesion, midline shift, or mass effect. No hydrocephalus. No extra-axial fluid collection. No abnormal enhancement. Craniocervical junction within normal limits. Visualized upper cervical spine unremarkable. Pituitary gland normal. No acute abnormality about the globes and orbits. Paranasal sinuses are clear. Minimal scattered opacity within the right mastoid air cells. Inner ear structures normal. Bone marrow signal intensity within normal limits. No scalp soft tissue abnormality. MRI CERVICAL SPINE FINDINGS Alignment: Vertebral bodies normally aligned with preservation of the normal cervical lordosis. No listhesis or subluxation. Vertebrae: Vertebral body heights well maintained. No evidence for acute or chronic fracture. Signal intensity within the vertebral body bone marrow is normal. Cord: Signal intensity within the cervical spinal cord is normal. No abnormal cord signal intensity to suggest underlying demyelinating disease. No abnormal enhancement. Posterior Fossa, vertebral arteries, paraspinal tissues: Visualized portions of the brain and posterior fossa are within normal limits. Craniocervical junction normal. Paraspinous soft tissues demonstrate no acute abnormality. No prevertebral or paraspinous edema. Normal intravascular flow voids present within the vertebral arteries bilaterally. Disc levels: C2-C3: Negative. C3-C4:   Negative. C4-C5: Mild degenerative disc bulge with bilateral uncovertebral hypertrophy. No significant stenosis. C5-C6:  Minimal bilateral uncovertebral spurring.  No stenosis. C6-C7: Broad-based posterior disc bulge with bilateral uncovertebral spurring. Minimal flattening of the ventral thecal sac without significant stenosis. Foramina remain patent. C7-T1:  Mild bilateral uncovertebral spurring without stenosis. Visualized portions of the upper thoracic spine demonstrate no acute abnormality. No significant stenosis. IMPRESSION: MRI BRAIN IMPRESSION: 1. Patchy and confluent cerebral white matter disease as above, most likely related to patient's history of underlying multiple sclerosis. No evidence for active demyelination. 2. Advanced cerebral atrophy for age, also likely related underlying demyelinating disease. 3. No other acute intracranial process identified. MRI CERVICAL SPINE IMPRESSION: 1. No MRI evidence for underlying demyelinating disease within the cervical spinal cord. 2. Shallow degenerative disc bulge at C6-7 without significant stenosis. 3. Additional relatively minor multilevel degenerative changes as above. No significant stenosis. Electronically Signed   By: Rise Mu M.D.   On: 08/02/2015 17:17   US Paracentesis  08/23/2015  INDICATION: History of alcohol abuse and cirrhosis now with symptomatic intra-abdominal ascites. Request made for ultrasound-guided paracentesis. EXAM: ULTRASOUND-GUIDED PARACENTESIS COMPARISON:  None. MEDICATIONS: 10 cc 1% lidocaine COMPLICATIONS: None immediate. TECHNIQUE: Informed written consent was obtained from the patient after a  discussion of the risks, benefits and alternatives to treatment. A timeout was performed prior to the initiation of the procedure. Initial ultrasound scanning demonstrates a large amount of ascites within the right lower abdominal quadrant. The right lower abdomen was prepped and draped in the usual sterile fashion. 1%  lidocaine with epinephrine was used for local anesthesia. Under direct ultrasound guidance, a 19 gauge, 7-cm, Yueh catheter was introduced. An ultrasound image was saved for documentation purposed. The paracentesis was performed. The catheter was removed and a dressing was applied. The patient tolerated the procedure well without immediate post procedural complication. FINDINGS: A total of approximately 2.7 liters of serous fluid was removed. IMPRESSION: Successful ultrasound-guided paracentesis yielding 2.7 liters of peritoneal fluid. Read by:  Robet Leu [facility] Electronically Signed   By: Simonne Come M.D.   On: 08/23/2015 09:48    Microbiology: Recent Results (from the past 240 hour(s))  MRSA PCR Screening     Status: None   Collection Time: 08/22/15 11:30 PM  Result Value Ref Range Status   MRSA by PCR NEGATIVE NEGATIVE Final    Comment:        The GeneXpert MRSA Assay (FDA approved for NASAL specimens only), is one component of a comprehensive MRSA colonization surveillance program. It is not intended to diagnose MRSA infection nor to guide or monitor treatment for MRSA infections.      Labs: Basic Metabolic Panel:  Recent Labs Lab 08/22/15 1610 08/23/15 0530  NA 136 134*  K 2.7* 4.0  CL 94* 93*  CO2 30 31  GLUCOSE 93 83  BUN <5* <5*  CREATININE 0.32* <0.30*  CALCIUM 7.8* 7.8*   Liver Function Tests:  Recent Labs Lab 08/22/15 1610 08/23/15 0530  AST 75* 78*  ALT 33 33  ALKPHOS 206* 200*  BILITOT 2.6* 3.1*  PROT 6.3* 6.6  ALBUMIN 3.1* 3.2*    Recent Labs Lab 08/22/15 1610  LIPASE 17   No results for input(s): AMMONIA in the last 168 hours. CBC:  Recent Labs Lab 08/22/15 1610 08/23/15 0530  WBC 11.1* 16.4*  NEUTROABS 8.2*  --   HGB 12.7 11.9*  HCT 37.7 36.4  MCV 98.7 100.8*  PLT 170 191   Cardiac Enzymes: No results for input(s): CKTOTAL, CKMB, CKMBINDEX, TROPONINI in the last 168 hours. BNP: BNP (last 3 results)  Recent Labs   06/10/15 1314  BNP 68.1    ProBNP (last 3 results) No results for input(s): PROBNP in the last 8760 hours.  CBG: No results for input(s): GLUCAP in the last 168 hours.  Time spent: 41 minutes were spent in preparing this discharge including medication reconciliation, counseling, and coordination of care.  Signed:  Sianne Tejada [employer]  [facility] 08/26/2015, 11:33 AM

## 2015-08-27 DIAGNOSIS — K7011 Alcoholic hepatitis with ascites: Secondary | ICD-10-CM | POA: Diagnosis not present

## 2015-08-27 LAB — BASIC METABOLIC PANEL
ANION GAP: 7 (ref 5–15)
Anion gap: 9 (ref 5–15)
BUN: 5 mg/dL — AB (ref 6–20)
BUN: 6 mg/dL (ref 6–20)
CALCIUM: 8.3 mg/dL — AB (ref 8.9–10.3)
CO2: 32 mmol/L (ref 22–32)
CO2: 28 mmol/L (ref 22–32)
CREATININE: 0.39 mg/dL — AB (ref 0.44–1.00)
Calcium: 8.2 mg/dL — ABNORMAL LOW (ref 8.9–10.3)
Chloride: 93 mmol/L — ABNORMAL LOW (ref 101–111)
Chloride: 99 mmol/L — ABNORMAL LOW (ref 101–111)
Creatinine, Ser: 0.47 mg/dL (ref 0.44–1.00)
GFR calc Af Amer: >60 mL/min (ref >60)
GFR calc Af Amer: >60 mL/min (ref >60)
GLUCOSE: 96 mg/dL (ref 65–99)
GLUCOSE: 101 mg/dL — AB (ref 65–99)
POTASSIUM: 2.6 mmol/L — AB (ref 3.5–5.1)
Potassium: 3.5 mmol/L (ref 3.5–5.1)
SODIUM: 134 mmol/L — AB (ref 135–145)
Sodium: 134 mmol/L — ABNORMAL LOW (ref 135–145)

## 2015-08-27 LAB — AMMONIA
AMMONIA: 124 umol/L — AB (ref 9–35)
Ammonia: 53 umol/L — ABNORMAL HIGH (ref 9–35)

## 2015-08-27 LAB — MAGNESIUM: MAGNESIUM: 1.8 mg/dL (ref 1.7–2.4)

## 2015-08-27 MED ORDER — POTASSIUM CHLORIDE CRYS ER 20 MEQ PO TBCR
40.0000 meq | EXTENDED_RELEASE_TABLET | ORAL | Status: AC
  Administered 2015-08-27 (×2): 40 meq via ORAL
  Filled 2015-08-27 (×2): qty 2

## 2015-08-27 MED ORDER — POTASSIUM CHLORIDE 10 MEQ/100ML IV SOLN
10.0000 meq | INTRAVENOUS | Status: AC
  Administered 2015-08-27 (×4): 10 meq via INTRAVENOUS
  Filled 2015-08-27 (×5): qty 100

## 2015-08-27 MED ORDER — IBUPROFEN 200 MG PO TABS
600.0000 mg | ORAL_TABLET | Freq: Four times a day (QID) | ORAL | Status: DC | PRN
  Administered 2015-08-28: 600 mg via ORAL
  Filled 2015-08-27: qty 3

## 2015-08-27 MED ORDER — LACTULOSE ENEMA
300.0000 mL | Freq: Two times a day (BID) | ORAL | Status: DC
  Administered 2015-08-27 – 2015-08-28 (×2): 300 mL via RECTAL
  Filled 2015-08-27 (×4): qty 300

## 2015-08-27 MED ORDER — CEFTRIAXONE SODIUM 2 G IJ SOLR
2.0000 g | INTRAMUSCULAR | Status: DC
  Administered 2015-08-28 – 2015-08-30 (×3): 2 g via INTRAVENOUS
  Filled 2015-08-27 (×3): qty 2

## 2015-08-27 NOTE — Progress Notes (Signed)
PROGRESS NOTE  Anne Black MMH:[id number] DOB: Apr 02, 1960 DOA: 08/22/2015 PCP: No primary care provider on file.  HPI/Recap of past 24 hours: Anne Black is a 55 y.o. female with medical history significant of ongoing EtOH abuse, cirrhosis, EtOH hepatitis in April, Ascites last drained in April. She has not been taking her lasix due to incontinence of urine. She presents to the ED with several week history of slowly worsening SOB, DOE, abdominal distention.  She had paracentesis 7/15, with 2.7 liters of fluid removed. Last ETOH 7/14.   Today she is a bit more awake, hasn't had sedating medication since yesterday. Ammonia is high but she has refused PO lactulose.  She seems less confused, just having a hard time waking up. She got rectal lactulose this AM.  Assessment/Plan: Principal Problem: Alcoholic hepatitis with ascites - s/p paracentesis, no abd pain, fevers to raise concern for SBP. - cont aldactone/lasix   Alcoholic cirrhosis (HCC)   Alcohol use disorder, severe, dependence (HCC) - I think she is over her withdrawal - placed on CIWA with lorazepam - completed PO librium taper  - cont lactulose - PO Folate, MVI, thiamine Alcoholic cirrhosis of liver with ascites (HCC) HypoK - replete today, Mg is normal  Code Status: FULL   Family Communication: None present   Disposition Plan: Likely to rehab facility when stable for d/c, hope in next 24 hours. MSW consulted for this.   Consultants:  None   Procedures:  Paracentesis 7/15 with radiology, 2.7 liters removed   Antimicrobials:  None    Objective: Filed Vitals:   08/26/15 2139 08/27/15 0452 08/27/15 1019 08/27/15 1413  BP:  99/51  101/65  Pulse:  92 91 85  Temp:  97.8 F (36.6 C)  98.7 F (37.1 C)  TempSrc:  Oral  Oral  Resp:  18 16 18   Height:      Weight:      SpO2: 100% 97% 95% 100%    Intake/Output Summary (Last 24 hours) at 08/27/15 1528 Last data filed at 08/27/15 1200  Gross per 24  hour  Intake      0 ml  Output   1500 ml  Net  -1500 ml   Filed Weights   08/22/15 1907 08/24/15 0000  Weight: 83.6 kg (184 lb 4.9 oz) 81.5 kg (179 lb 10.8 oz)    Exam: General:  Pt is alert and oriented today, looks sleepy, slight tremor noted Eyes: pupils round and reactive to light and accomodation, icteric sclerea Neck: supple, no masses, trachea mildline  Cardiovascular: RRR, no murmurs or rubs, 2+ pitting BLE edema  Respiratory: clear to auscultation bilaterally, no wheezes, no crackles  Abdomen: soft, nontender, mildly distended, normal bowel tones heard  Skin: dry, no rashes  Musculoskeletal: no joint effusions, normal range of motion  Psychiatric: appropriate affect, normal speech  Neurologic: extraocular muscles intact, clear speech, moving all extremities with intact sensorium    Data Reviewed: CBC:  Recent Labs Lab 08/22/15 1610 08/23/15 0530  WBC 11.1* 16.4*  NEUTROABS 8.2*  --   HGB 12.7 11.9*  HCT 37.7 36.4  MCV 98.7 100.8*  PLT 170 191   Basic Metabolic Panel:  Recent Labs Lab 08/22/15 1610 08/23/15 0530 08/27/15 0747  NA 136 134* 134*  K 2.7* 4.0 2.6*  CL 94* 93* 93*  CO2 30 31 32  GLUCOSE 93 83 96  BUN <5* <5* 5*  CREATININE 0.32* <0.30* 0.39*  CALCIUM 7.8* 7.8* 8.2*  MG  --   --  1.8   GFR: Estimated Creatinine Clearance: 75.1 mL/min (by C-G formula based on Cr of 0.39). Liver Function Tests:  Recent Labs Lab 08/22/15 1610 08/23/15 0530  AST 75* 78*  ALT 33 33  ALKPHOS 206* 200*  BILITOT 2.6* 3.1*  PROT 6.3* 6.6  ALBUMIN 3.1* 3.2*    Recent Labs Lab 08/22/15 1610  LIPASE 17    Recent Labs Lab 08/26/15 1317 08/27/15 0747  AMMONIA 92* 124*   Coagulation Profile: No results for input(s): INR, PROTIME in the last 168 hours. Cardiac Enzymes: No results for input(s): CKTOTAL, CKMB, CKMBINDEX, TROPONINI in the last 168 hours. BNP (last 3 results) No results for input(s): PROBNP in the last 8760 hours. HbA1C: No  results for input(s): HGBA1C in the last 72 hours. CBG: No results for input(s): GLUCAP in the last 168 hours. Lipid Profile: No results for input(s): CHOL, HDL, LDLCALC, TRIG, CHOLHDL, LDLDIRECT in the last 72 hours. Thyroid Function Tests: No results for input(s): TSH, T4TOTAL, FREET4, T3FREE, THYROIDAB in the last 72 hours. Anemia Panel: No results for input(s): VITAMINB12, FOLATE, FERRITIN, TIBC, IRON, RETICCTPCT in the last 72 hours. Urine analysis:    Component Value Date/Time   COLORURINE AMBER* 08/22/2015 1740   APPEARANCEUR CLOUDY* 08/22/2015 1740   LABSPEC 1.026 08/22/2015 1740   PHURINE 6.0 08/22/2015 1740   GLUCOSEU NEGATIVE 08/22/2015 1740   HGBUR NEGATIVE 08/22/2015 1740   BILIRUBINUR MODERATE* 08/22/2015 1740   BILIRUBINUR moderate* 06/19/2015 1906   KETONESUR 40* 08/22/2015 1740   KETONESUR trace (5)* 06/19/2015 1906   PROTEINUR 30* 08/22/2015 1740   PROTEINUR negative 06/19/2015 1906   UROBILINOGEN >=8.0 06/19/2015 1906   UROBILINOGEN 0.2 09/14/2011 2141   NITRITE POSITIVE* 08/22/2015 1740   NITRITE Negative 06/19/2015 1906   LEUKOCYTESUR SMALL* 08/22/2015 1740   Sepsis Labs: @LABRCNTIP (procalcitonin:4,lacticidven:4)  ) Recent Results (from the past 240 hour(s))  MRSA PCR Screening     Status: None   Collection Time: 08/22/15 11:30 PM  Result Value Ref Range Status   MRSA by PCR NEGATIVE NEGATIVE Final    Comment:        The GeneXpert MRSA Assay (FDA approved for NASAL specimens only), is one component of a comprehensive MRSA colonization surveillance program. It is not intended to diagnose MRSA infection nor to guide or monitor treatment for MRSA infections.   Culture, Urine     Status: Abnormal   Collection Time: 08/25/15  2:14 PM  Result Value Ref Range Status   Specimen Description URINE, RANDOM  Final   Special Requests NONE  Final   Culture MULTIPLE SPECIES PRESENT, SUGGEST RECOLLECTION (A)  Final   Report Status 08/26/2015 FINAL  Final        Studies: No results found.  Scheduled Meds: . amantadine  100 mg Oral BID  . [START ON 08/28/2015] cefTRIAXone (ROCEPHIN)  IV  2 g Intravenous Q24H  . enoxaparin (LOVENOX) injection  40 mg Subcutaneous Q24H  . FLUoxetine  20 mg Oral Daily  . folic acid  1 mg Oral Daily  . furosemide  40 mg Oral Daily  . gabapentin  200 mg Oral TID  . lactulose  10 g Oral TID  . lactulose  300 mL Rectal BID  . mometasone-formoterol  2 puff Inhalation BID  . multivitamin with minerals  1 tablet Oral Daily  . nicotine  21 mg Transdermal Daily  . oxybutynin  5 mg Oral QHS  . pantoprazole  40 mg Oral Daily  . risperiDONE  0.5 mg Oral  QHS  . sodium chloride flush  3 mL Intravenous Q12H  . spironolactone  100 mg Oral Daily  . thiamine  100 mg Oral Daily  . topiramate  100 mg Oral Daily    Continuous Infusions:    LOS: 5 days   Time spent: 26 minutes  Jermario Kalmar Vergie Living, MD [facility] Pager [phone]  If 7PM-7AM, please contact night-coverage [email or web address] Password [facility] 08/27/2015, 3:28 PM

## 2015-08-27 NOTE — Progress Notes (Addendum)
LCSWA met with patient at bedside. Patient still very lethargic. Patient asked, "when did I come into the hospital". Patient  She requested condiments for meal and continued to eat breakfast. Patient does not have recollection of anything that happened prior to admission. Patient agreed to SNF then declined going to SNF despite not ambulating well at this time.Patient declined SA rehab as well.  Patient larthargic duiring LCSWA second visit and unresponsive to questions.  Will attempt to follow up again.   Kathrin Greathouse, Latanya Presser, MSW Clinical Social Worker 5E and Psychiatric Service Line [phone] 08/27/2015  2:09 PM

## 2015-08-27 NOTE — Progress Notes (Signed)
Physical Therapy Treatment Patient Details Name: Anne Black MRN: [id number] DOB: 1960/06/16 Today's Date: 08/27/2015    History of Present Illness 55 y.o. female adm with SOB and abd distention, ongoing ETOH;  PMHx: multiple sclerosis, bipolar disorder, depression and anxiety, and ETOH dependence presented with diarrhea    PT Comments    Pt in bed very sleepy and groggy.  Assisted OOB to attempt amb to bathroom however pt declined so assisted to [facility] which required + 2 assist.  Very unsteady with B knees buckling.  Only able to take a few side turn steps incomplete.  HIGH FALL RISK.   Follow Up Recommendations  SNF     Equipment Recommendations       Recommendations for Other Services       Precautions / Restrictions Precautions Precautions: Fall Restrictions Weight Bearing Restrictions: No    Mobility  Bed Mobility Overal bed mobility: Needs Assistance Bed Mobility: Supine to Sit;Sit to Supine;Rolling     Supine to sit: Mod assist Sit to supine: Mod assist   General bed mobility comments: multi-moal cues for sequence and participation; pt falls asleep without constatnt stimuli.  Pt uncontrolled decend back onto bed.    Transfers Overall transfer level: Needs assistance Equipment used: Rolling walker (2 wheeled) Transfers: Sit to/from [employer] Sit to Stand: Max assist;+2 physical assistance Stand pivot transfers: Max assist;+2 physical assistance       General transfer comment: very groggy requiring + 2 assist.  Only able to assist from bed to [facility] then back to bed.  B knees buckle.    Ambulation/Gait             General Gait Details: unable to attempt due to poor transfer ability.    Stairs            Wheelchair Mobility    Modified Rankin (Stroke Patients Only)       Balance                                    Cognition Arousal/Alertness: Lethargic;Suspect due to medications Behavior During Therapy:  Flat affect Overall Cognitive Status: Impaired/Different from baseline   Orientation Level: Situation;Place;Time     Following Commands: Follows one step commands inconsistently Safety/Judgement: Decreased awareness of safety;Decreased awareness of deficits   Problem Solving: Slow processing;Difficulty sequencing;Requires verbal cues;Requires tactile cues      Exercises      General Comments        Pertinent Vitals/Pain Pain Assessment: No/denies pain    Home Living                      Prior Function            PT Goals (current goals can now be found in the care plan section) Progress towards PT goals: Progressing toward goals    Frequency  Min 3X/week    PT Plan Current plan remains appropriate    Co-evaluation             End of Session Equipment Utilized During Treatment: Gait belt Activity Tolerance: Patient limited by lethargy Patient left: in bed;with call bell/phone within reach;with bed alarm set     Time: 1440-1505 PT Time Calculation (min) (ACUTE ONLY): 25 min  Charges:  $Therapeutic Activity: 23-37 mins  G Codes:      Rica Koyanagi  PTA [facility]  Acute  Rehab Pager      [phone]

## 2015-08-27 NOTE — NC FL2 (Signed)
[place] MEDICAID FL2 LEVEL OF CARE SCREENING TOOL     IDENTIFICATION  Patient Name: Anne Black Birthdate: Aug 09, 1960 Sex: female Admission Date (Current Location): 08/22/2015  [facility] and [place] Number:  Producer, television/film/video and Address:  [facility],  [address] [place] [address]      Provider Number: [id number]  Attending Physician Name and Address:  Mir Vergie Living,*  Relative Name and Phone Number:       Current Level of Care: Hospital Recommended Level of Care: Skilled Nursing Facility Prior Approval Number:    Date Approved/Denied:   PASRR Number:    Discharge Plan: SNF    Current Diagnoses: Patient Active Problem List   Diagnosis Date Noted  . Alcoholic cirrhosis of liver with ascites (HCC) 08/22/2015  . Bipolar disorder, current episode mixed, moderate (HCC) 08/01/2015  . Alcohol use disorder, severe, dependence (HCC) 07/31/2015  . Alcoholic cirrhosis (HCC) 05/31/2015  . Alcoholic hepatitis with ascites 05/31/2015  . Macrocytic anemia- due to alcohol abuse with normal B12 & folate levels 05/31/2015  . Severe protein-calorie malnutrition (HCC) 05/31/2015  . Hypokalemia 05/26/2015  . Encephalopathy, hepatic (HCC) 05/26/2015  . Anxiety 05/18/2015  . Sepsis (HCC) 05/18/2015  . UTI (lower urinary tract infection) 05/18/2015  . Stimulant abuse 12/27/2014  . Nicotine abuse 12/27/2014  . Hyperprolactinemia (HCC) 11/15/2014  . Dyslipidemia   . Noncompliance with therapeutic plan 04/04/2013  . Unspecified hereditary and idiopathic peripheral neuropathy 05/01/2012  . GERD (gastroesophageal reflux disease) 05/01/2012  . Osteoarthrosis, unspecified whether generalized or localized, involving lower leg 05/01/2012  . MS (multiple sclerosis) (HCC) 09/14/2011    Orientation RESPIRATION BLADDER Height & Weight     Self  Normal Indwelling catheter Weight: 179 lb 10.8 oz (81.5 kg) Height:  5' (152.4 cm)  BEHAVIORAL SYMPTOMS/MOOD  NEUROLOGICAL BOWEL NUTRITION STATUS      Continent Diet  AMBULATORY STATUS COMMUNICATION OF NEEDS Skin   Extensive Assist Verbally Normal                       Personal Care Assistance Level of Assistance  Bathing, Feeding, Dressing Bathing Assistance: Limited assistance Feeding assistance: Limited assistance Dressing Assistance: Limited assistance     Functional Limitations Info  Sight, Hearing, Speech Sight Info: Adequate Hearing Info: Adequate Speech Info: Adequate    SPECIAL CARE FACTORS FREQUENCY  PT (By licensed PT)     PT Frequency: 5              Contractures Contractures Info: Not present    Additional Factors Info  Isolation Precautions, Allergies   Allergies Info: No Known Allergies     Isolation Precautions Info: MRSA     Current Medications (08/27/2015):  This is the current hospital active medication list Current Facility-Administered Medications  Medication Dose Route Frequency Provider Last Rate Last Dose  . albuterol (PROVENTIL) (2.5 MG/3ML) 0.083% nebulizer solution 3 mL  3 mL Inhalation Q6H PRN Hillary Bow, DO      . amantadine (SYMMETREL) capsule 100 mg  100 mg Oral BID Hillary Bow, DO   100 mg at 08/27/15 1014  . [START ON 08/28/2015] cefTRIAXone (ROCEPHIN) 2 g in dextrose 5 % 50 mL IVPB  2 g Intravenous Q24H Aleda Grana, RPH      . enoxaparin (LOVENOX) injection 40 mg  40 mg Subcutaneous Q24H Hillary Bow, DO   40 mg at 08/26/15 2227  . FLUoxetine (PROZAC) capsule 20 mg  20  mg Oral Daily Hillary Bow, DO   20 mg at 08/27/15 1014  . folic acid (FOLVITE) tablet 1 mg  1 mg Oral Daily Hillary Bow, DO   1 mg at 08/25/15 1232  . furosemide (LASIX) tablet 40 mg  40 mg Oral Daily Hillary Bow, DO   40 mg at 08/27/15 1014  . gabapentin (NEURONTIN) capsule 200 mg  200 mg Oral TID Hillary Bow, DO   200 mg at 08/26/15 2227  . haloperidol lactate (HALDOL) injection 2 mg  2 mg Intravenous Q6H PRN Mir Vergie Living, MD    2 mg at 08/24/15 1454  . hydrOXYzine (ATARAX/VISTARIL) tablet 25 mg  25 mg Oral Q8H PRN Hillary Bow, DO   25 mg at 08/24/15 1255  . ibuprofen (ADVIL,MOTRIN) tablet 600 mg  600 mg Oral Q6H PRN Mir Vergie Living, MD      . lactulose (CHRONULAC) 10 GM/15ML solution 10 g  10 g Oral TID Mir Vergie Living, MD   10 g at 08/27/15 1029  . lactulose (CHRONULAC) enema 200 gm  300 mL Rectal BID Mir Vergie Living, MD   300 mL at 08/27/15 1114  . mometasone-formoterol (DULERA) 200-5 MCG/ACT inhaler 2 puff  2 puff Inhalation BID Hillary Bow, DO   2 puff at 08/27/15 1019  . multivitamin with minerals tablet 1 tablet  1 tablet Oral Daily Hillary Bow, DO   1 tablet at 08/25/15 1233  . nicotine (NICODERM CQ - dosed in mg/24 hours) patch 21 mg  21 mg Transdermal Daily Hillary Bow, DO   21 mg at 08/27/15 1029  . ondansetron (ZOFRAN) injection 4 mg  4 mg Intravenous Q6H PRN Jinger Neighbors, NP   4 mg at 08/22/15 2214  . oxybutynin (DITROPAN-XL) 24 hr tablet 5 mg  5 mg Oral QHS Hillary Bow, DO   5 mg at 08/26/15 2227  . pantoprazole (PROTONIX) EC tablet 40 mg  40 mg Oral Daily Hillary Bow, DO   40 mg at 08/25/15 1233  . risperiDONE (RISPERDAL) tablet 0.5 mg  0.5 mg Oral QHS Hillary Bow, DO   0.5 mg at 08/26/15 2227  . sodium chloride flush (NS) 0.9 % injection 3 mL  3 mL Intravenous Q12H Hillary Bow, DO   3 mL at 08/27/15 1000  . spironolactone (ALDACTONE) tablet 100 mg  100 mg Oral Daily Hillary Bow, DO   100 mg at 08/27/15 1014  . thiamine (VITAMIN B-1) tablet 100 mg  100 mg Oral Daily Hillary Bow, DO   100 mg at 08/25/15 1234  . topiramate (TOPAMAX) tablet 100 mg  100 mg Oral Daily Hillary Bow, DO   100 mg at 08/27/15 1014     Discharge Medications: Please see discharge summary for a list of discharge medications.  Relevant Imaging Results:  Relevant Lab Results:   Additional Information SSN# [id number]  Clearance Coots, LCSW

## 2015-08-27 NOTE — Progress Notes (Signed)
CRITICAL VALUE ALERT  Critical value received:  K+ 2.6  Date of notification:  08/27/2015  Time of notification: 0838 Critical value read back:Yes.    Nurse who received alert:  Justin Mend, RN  MD notified (1st page):  Erenest Blank  Time of first page: [phone]  MD notified (2nd page):  Time of second page:  Responding MD:    Time MD responded:

## 2015-08-28 DIAGNOSIS — E876 Hypokalemia: Secondary | ICD-10-CM | POA: Diagnosis not present

## 2015-08-28 DIAGNOSIS — F10230 Alcohol dependence with withdrawal, uncomplicated: Secondary | ICD-10-CM | POA: Diagnosis not present

## 2015-08-28 DIAGNOSIS — K7031 Alcoholic cirrhosis of liver with ascites: Secondary | ICD-10-CM | POA: Diagnosis not present

## 2015-08-28 LAB — BASIC METABOLIC PANEL
Anion gap: 6 (ref 5–15)
BUN: 8 mg/dL (ref 6–20)
CHLORIDE: 100 mmol/L — AB (ref 101–111)
CO2: 28 mmol/L (ref 22–32)
CREATININE: 0.39 mg/dL — AB (ref 0.44–1.00)
Calcium: 8.4 mg/dL — ABNORMAL LOW (ref 8.9–10.3)
GFR calc non Af Amer: >60 mL/min (ref >60)
GLUCOSE: 101 mg/dL — AB (ref 65–99)
Potassium: 3.2 mmol/L — ABNORMAL LOW (ref 3.5–5.1)
Sodium: 134 mmol/L — ABNORMAL LOW (ref 135–145)

## 2015-08-28 LAB — CBC
HCT: 35.7 % — ABNORMAL LOW (ref 36.0–46.0)
Hemoglobin: 12 g/dL (ref 12.0–15.0)
MCH: 33.3 pg (ref 26.0–34.0)
MCHC: 33.6 g/dL (ref 30.0–36.0)
MCV: 99.2 fL (ref 78.0–100.0)
PLATELETS: 130 10*3/uL — AB (ref 150–400)
RBC: 3.6 MIL/uL — AB (ref 3.87–5.11)
RDW: 17.8 % — ABNORMAL HIGH (ref 11.5–15.5)
WBC: 12.8 10*3/uL — ABNORMAL HIGH (ref 4.0–10.5)

## 2015-08-28 LAB — AMMONIA: Ammonia: 70 umol/L — ABNORMAL HIGH (ref 9–35)

## 2015-08-28 MED ORDER — HALOPERIDOL LACTATE 5 MG/ML IJ SOLN: 1.0000 mg | Freq: Two times a day (BID) | INTRAMUSCULAR | Status: DC | PRN

## 2015-08-28 MED ORDER — LACTULOSE 10 GM/15ML PO SOLN
20.0000 g | Freq: Three times a day (TID) | ORAL | Status: DC
  Administered 2015-08-28 – 2015-08-29 (×2): 20 g via ORAL
  Filled 2015-08-28 (×4): qty 30

## 2015-08-28 MED ORDER — POTASSIUM CHLORIDE CRYS ER 20 MEQ PO TBCR
40.0000 meq | EXTENDED_RELEASE_TABLET | ORAL | Status: AC
  Administered 2015-08-28 (×2): 40 meq via ORAL
  Filled 2015-08-28 (×2): qty 2

## 2015-08-28 MED ORDER — POTASSIUM CHLORIDE CRYS ER 20 MEQ PO TBCR
20.0000 meq | EXTENDED_RELEASE_TABLET | Freq: Once | ORAL | Status: DC
Start: 2015-08-28 — End: 2015-08-28

## 2015-08-28 NOTE — Progress Notes (Signed)
Refused lactulose enema

## 2015-08-28 NOTE — Progress Notes (Addendum)
PROGRESS NOTE  Anne Black ZOX:[id number] DOB: 02-10-1960 DOA: 08/22/2015 PCP: No primary care provider on file.  Brief narrative 55 year old female with PMH of alcohol abuse, cirrhosis, alcoholic hepatitis in April, ascites which was last drained in April, non-compliant with her Lasix due to urinary incontinence, multiple sclerosis, anxiety/bipolar/ADHD, presented to the [facility] ED on 08/22/15 with several week history of worsening dyspnea and abdominal distention. She admitted to drinking heavily.  Assessment/Plan:  Cirrhosis, suspected alcoholic with ascites -Status post ultrasound-guided paracentesis of 2.7 L by IR on 7/15. - Aldactone and furosemide resumed on admission. - Does not appear that any of the ascitic fluid was sent for testing. As per record review, in the absence of abdominal pain, fever or confusion, low index of SBP at that time - Patient however complains of mild abdominal pain. Rocephin was empirically started on 7/17,? For UTI. Continue same for now. May consider repeating paracentesis.   Alcohol dependence/withdrawal - Ongoing heavy alcohol abuse. Blood alcohol level on admission 172.  - Treated per CIWA will call and scores fluctuating between 1-9. Getting Risperdal at bedtime and when necessary IV haloperidol. Follow EKG in a.m. for QTC. EKG 7/14: QTC 467 ms.  Acute hepatic encephalopathy  -  patient has been refusing oral and rectal lactulose intermittently. Ammonia level had come down to 53 but is increased to 70. Encourage lactulose-increased oral dose. Monitor clinically and with serial ammonia levels.  Hypokalemia -Replace and follow. Magnesium 1.8 on 7/19.  Mild thrombocytopenia - Likely secondary to cirrhosis and alcohol abuse. Follow CBC in a.m.  Mild hyponatremia - Likely related to cirrhosis and volume overload. Stable.   ? UTI - Urine microscopy from 7/14 showed few bacteria, positive nitrite and 0-5 WBC. Urine culture suggestive  of contamination. Completed 4 days of IV Rocephin and hence treatment for this indication.    DVT Prophylaxis: Lovenox  Code Status: FULL  Family Communication: No family at bedside.  Disposition Plan: Likely to rehab facility when stable for d/c. Not stable for discharge.   Consultants:  Interventional radiology.  Procedures:  Ultrasound-guided paracentesis 7/15: Drained 2.7 L of ascitic fluid.  Antimicrobials:  Ceftriaxone 7/17    Subjective Appears confused. Denies pain. Complains of feeling weak. As per RN, has been refusing lactulose and is confused.  Objective: Filed Vitals:   08/27/15 1413 08/27/15 2001 08/27/15 2111 08/28/15 0616  BP: 101/65  108/65 104/64  Pulse: 85  91 86  Temp: 98.7 F (37.1 C)  97.7 F (36.5 C) 97.5 F (36.4 C)  TempSrc: Oral  Oral Oral  Resp: Height:      Weight:      SpO2: 100% 93% 100% 100%    Intake/Output Summary (Last 24 hours) at 08/28/15 1838 Last data filed at 08/28/15 1454  Gross per 24 hour  Intake     60 ml  Output   1650 ml  Net  -1590 ml   Filed Weights   08/22/15 1907 08/24/15 0000  Weight: 83.6 kg (184 lb 4.9 oz) 81.5 kg (179 lb 10.8 oz)    Exam: General: Pleasant middle-aged female sitting up comfortably propped up in bed.  Cardiovascular: RRR, no murmurs or rubs, 1+ pitting BLE edema. Telemetry: Sinus rhythm.  Respiratory: clear to auscultation bilaterally, no wheezes, no crackles . No increased work of breathing.  Abdomen: soft, nontender, mildly distended, normal bowel tones heard . Ascites + +.  Skin: dry, no rashes  Musculoskeletal: no joint effusions, normal  range of motion  Psychiatric:Confused.  Neurologic: Alert and oriented to self and partly to place. No focal neurological deficits. Asterixis +.   Data Reviewed: CBC:  Recent Labs Lab 08/22/15 1610 08/23/15 0530 08/28/15 0528  WBC 11.1* 16.4* 12.8*  NEUTROABS 8.2*  --   --   HGB 12.7 11.9* 12.0  HCT 37.7 36.4 35.7*  MCV 98.7  100.8* 99.2  PLT 170 191 130*   Basic Metabolic Panel:  Recent Labs Lab 08/22/15 1610 08/23/15 0530 08/27/15 0747 08/27/15 1734 08/28/15 0528  NA 136 134* 134* 134* 134*  K 2.7* 4.0 2.6* 3.5 3.2*  CL 94* 93* 93* 99* 100*  CO2 30 31 32 28 28  GLUCOSE 93 83 96 101* 101*  BUN <5* <5* 5* 6 8  CREATININE 0.32* <0.30* 0.39* 0.47 0.39*  CALCIUM 7.8* 7.8* 8.2* 8.3* 8.4*  MG  --   --  1.8  --   --    GFR: Estimated Creatinine Clearance: 75.1 mL/min (by C-G formula based on Cr of 0.39). Liver Function Tests:  Recent Labs Lab 08/22/15 1610 08/23/15 0530  AST 75* 78*  ALT 33 33  ALKPHOS 206* 200*  BILITOT 2.6* 3.1*  PROT 6.3* 6.6  ALBUMIN 3.1* 3.2*    Recent Labs Lab 08/22/15 1610  LIPASE 17    Recent Labs Lab 08/26/15 1317 08/27/15 0747 08/27/15 1734 08/28/15 0528  AMMONIA 92* 124* 53* 70*   Coagulation Profile: No results for input(s): INR, PROTIME in the last 168 hours. Cardiac Enzymes: No results for input(s): CKTOTAL, CKMB, CKMBINDEX, TROPONINI in the last 168 hours. BNP (last 3 results) No results for input(s): PROBNP in the last 8760 hours. HbA1C: No results for input(s): HGBA1C in the last 72 hours. CBG: No results for input(s): GLUCAP in the last 168 hours. Lipid Profile: No results for input(s): CHOL, HDL, LDLCALC, TRIG, CHOLHDL, LDLDIRECT in the last 72 hours. Thyroid Function Tests: No results for input(s): TSH, T4TOTAL, FREET4, T3FREE, THYROIDAB in the last 72 hours. Anemia Panel: No results for input(s): VITAMINB12, FOLATE, FERRITIN, TIBC, IRON, RETICCTPCT in the last 72 hours. Urine analysis:    Component Value Date/Time   COLORURINE AMBER* 08/22/2015 1740   APPEARANCEUR CLOUDY* 08/22/2015 1740   LABSPEC 1.026 08/22/2015 1740   PHURINE 6.0 08/22/2015 1740   GLUCOSEU NEGATIVE 08/22/2015 1740   HGBUR NEGATIVE 08/22/2015 1740   BILIRUBINUR MODERATE* 08/22/2015 1740   BILIRUBINUR moderate* 06/19/2015 1906   KETONESUR 40* 08/22/2015 1740    KETONESUR trace (5)* 06/19/2015 1906   PROTEINUR 30* 08/22/2015 1740   PROTEINUR negative 06/19/2015 1906   UROBILINOGEN >=8.0 06/19/2015 1906   UROBILINOGEN 0.2 09/14/2011 2141   NITRITE POSITIVE* 08/22/2015 1740   NITRITE Negative 06/19/2015 1906   LEUKOCYTESUR SMALL* 08/22/2015 1740   Sepsis Labs: @LABRCNTIP (procalcitonin:4,lacticidven:4)  ) Recent Results (from the past 240 hour(s))  MRSA PCR Screening     Status: None   Collection Time: 08/22/15 11:30 PM  Result Value Ref Range Status   MRSA by PCR NEGATIVE NEGATIVE Final    Comment:        The GeneXpert MRSA Assay (FDA approved for NASAL specimens only), is one component of a comprehensive MRSA colonization surveillance program. It is not intended to diagnose MRSA infection nor to guide or monitor treatment for MRSA infections.   Culture, Urine     Status: Abnormal   Collection Time: 08/25/15  2:14 PM  Result Value Ref Range Status   Specimen Description URINE, RANDOM  Final  Special Requests NONE  Final   Culture MULTIPLE SPECIES PRESENT, SUGGEST RECOLLECTION (A)  Final   Report Status 08/26/2015 FINAL  Final      Studies: No results found.  Scheduled Meds: . amantadine  100 mg Oral BID  . cefTRIAXone (ROCEPHIN)  IV  2 g Intravenous Q24H  . enoxaparin (LOVENOX) injection  40 mg Subcutaneous Q24H  . FLUoxetine  20 mg Oral Daily  . folic acid  1 mg Oral Daily  . furosemide  40 mg Oral Daily  . gabapentin  200 mg Oral TID  . lactulose  10 g Oral TID  . lactulose  300 mL Rectal BID  . mometasone-formoterol  2 puff Inhalation BID  . multivitamin with minerals  1 tablet Oral Daily  . nicotine  21 mg Transdermal Daily  . oxybutynin  5 mg Oral QHS  . pantoprazole  40 mg Oral Daily  . risperiDONE  0.5 mg Oral QHS  . sodium chloride flush  3 mL Intravenous Q12H  . spironolactone  100 mg Oral Daily  . thiamine  100 mg Oral Daily  . topiramate  100 mg Oral Daily    Continuous Infusions:    LOS: 6 days    Time spent: 25 minutes  Anne Forstrom, MD, FACP, FHM. [facility] Pager [phone]  If 7PM-7AM, please contact night-coverage [email or web address] Password [email or web address] 08/28/2015, 6:57 PM

## 2015-08-29 ENCOUNTER — Other Ambulatory Visit: Payer: Self-pay

## 2015-08-29 DIAGNOSIS — E876 Hypokalemia: Secondary | ICD-10-CM

## 2015-08-29 DIAGNOSIS — K7011 Alcoholic hepatitis with ascites: Secondary | ICD-10-CM | POA: Diagnosis not present

## 2015-08-29 DIAGNOSIS — K7030 Alcoholic cirrhosis of liver without ascites: Secondary | ICD-10-CM

## 2015-08-29 DIAGNOSIS — F1020 Alcohol dependence, uncomplicated: Secondary | ICD-10-CM | POA: Diagnosis not present

## 2015-08-29 DIAGNOSIS — K7031 Alcoholic cirrhosis of liver with ascites: Secondary | ICD-10-CM | POA: Diagnosis not present

## 2015-08-29 LAB — BASIC METABOLIC PANEL
ANION GAP: 7 (ref 5–15)
Anion gap: 8 (ref 5–15)
BUN: 11 mg/dL (ref 6–20)
BUN: 11 mg/dL (ref 6–20)
CO2: 29 mmol/L (ref 22–32)
CO2: 27 mmol/L (ref 22–32)
Calcium: 8.8 mg/dL — ABNORMAL LOW (ref 8.9–10.3)
Calcium: 8.7 mg/dL — ABNORMAL LOW (ref 8.9–10.3)
Chloride: 99 mmol/L — ABNORMAL LOW (ref 101–111)
Chloride: 97 mmol/L — ABNORMAL LOW (ref 101–111)
Creatinine, Ser: 0.37 mg/dL — ABNORMAL LOW (ref 0.44–1.00)
Creatinine, Ser: 0.32 mg/dL — ABNORMAL LOW (ref 0.44–1.00)
GFR calc Af Amer: >60 mL/min (ref >60)
GFR calc non Af Amer: >60 mL/min (ref >60)
Glucose, Bld: 113 mg/dL — ABNORMAL HIGH (ref 65–99)
Glucose, Bld: 104 mg/dL — ABNORMAL HIGH (ref 65–99)
POTASSIUM: 3.1 mmol/L — AB (ref 3.5–5.1)
Potassium: 3.5 mmol/L (ref 3.5–5.1)
SODIUM: 135 mmol/L (ref 135–145)
Sodium: 132 mmol/L — ABNORMAL LOW (ref 135–145)

## 2015-08-29 LAB — URINE MICROSCOPIC-ADD ON

## 2015-08-29 LAB — URINALYSIS, ROUTINE W REFLEX MICROSCOPIC
Glucose, UA: NEGATIVE mg/dL
Hgb urine dipstick: NEGATIVE
Ketones, ur: 15 mg/dL — AB
Nitrite: POSITIVE — AB
Protein, ur: 30 mg/dL — AB
Specific Gravity, Urine: 1.03 (ref 1.005–1.030)
pH: 6.5 (ref 5.0–8.0)

## 2015-08-29 LAB — MAGNESIUM: MAGNESIUM: 1.9 mg/dL (ref 1.7–2.4)

## 2015-08-29 LAB — CBC
HEMATOCRIT: 39.5 % (ref 36.0–46.0)
HEMOGLOBIN: 13.2 g/dL (ref 12.0–15.0)
MCH: 33.2 pg (ref 26.0–34.0)
MCHC: 33.4 g/dL (ref 30.0–36.0)
MCV: 99.2 fL (ref 78.0–100.0)
Platelets: 142 10*3/uL — ABNORMAL LOW (ref 150–400)
RBC: 3.98 MIL/uL (ref 3.87–5.11)
RDW: 17.7 % — AB (ref 11.5–15.5)
WBC: 16.2 10*3/uL — AB (ref 4.0–10.5)

## 2015-08-29 LAB — AMMONIA: Ammonia: 39 umol/L — ABNORMAL HIGH (ref 9–35)

## 2015-08-29 MED ORDER — POTASSIUM CHLORIDE CRYS ER 20 MEQ PO TBCR
40.0000 meq | EXTENDED_RELEASE_TABLET | Freq: Once | ORAL | Status: AC
  Administered 2015-08-29: 40 meq via ORAL
  Filled 2015-08-29: qty 2

## 2015-08-29 MED ORDER — POTASSIUM CHLORIDE 10 MEQ/100ML IV SOLN
10.0000 meq | INTRAVENOUS | Status: AC
  Administered 2015-08-29 (×2): 10 meq via INTRAVENOUS
  Filled 2015-08-29 (×2): qty 100

## 2015-08-29 MED ORDER — LORAZEPAM 0.5 MG PO TABS
0.2500 mg | ORAL_TABLET | Freq: Three times a day (TID) | ORAL | Status: DC | PRN
Start: 2015-08-29 — End: 2015-08-30
  Administered 2015-08-29 – 2015-08-30 (×3): 0.25 mg via ORAL
  Filled 2015-08-29 (×3): qty 1

## 2015-08-29 MED ORDER — POTASSIUM CHLORIDE 10 MEQ/100ML IV SOLN: 10.0000 meq | INTRAVENOUS | Status: AC

## 2015-08-29 NOTE — Progress Notes (Signed)
Received call for executive office regarding call from pt daughter and concern that mother was going home today per her conversation with the pt.  Spoke with pt daughter-[name] after speaking with RN and notified her that no plans for mother to be discharged.  [name] thanked me for the follow up.

## 2015-08-29 NOTE — Progress Notes (Signed)
PT Cancellation Note  Patient Details Name: Anne Black MRN: [id number] DOB: 1960/11/10   Cancelled Treatment:     attempted x 2                                            AM agitated and declined any OOB activity                                            PM sound sleeping   Armando Reichert 08/29/2015, 3:44 PM

## 2015-08-29 NOTE — Progress Notes (Signed)
PROGRESS NOTE  Anne Black:[id number] DOB: 10/08/1960 DOA: 08/22/2015 PCP: No primary care provider on file.  Brief narrative 55 year old female with PMH of alcohol abuse, cirrhosis, alcoholic hepatitis in April, ascites which was last drained in April, non-compliant with her Lasix due to urinary incontinence, multiple sclerosis, anxiety/bipolar/ADHD, presented to the [facility] ED on 08/22/15 with several week history of worsening dyspnea and abdominal distention. She admitted to drinking heavily.  Assessment/Plan:  Cirrhosis, suspected alcoholic with ascites - Counseled to quit. -Status post ultrasound-guided paracentesis of 2.7 L by IR on 7/15. - Aldactone and furosemide resumed on admission. - Does not appear that any of the ascitic fluid was sent for testing. As per record review, in the absence of abdominal pain, fever or confusion, low index of SBP at that time - Patient however complains of mild abdominal pain. Rocephin was empirically started on 7/17,? For UTI. Continue same for now. May consider repeating paracentesis.   Alcohol dependence/withdrawal  - Ongoing heavy alcohol abuse. Blood alcohol level on admission 172.  - Treated per CIWA will call and scores fluctuating between 1-9. Getting Risperdal at bedtime and when necessary IV haloperidol. Follow EKG in a.m. for QTC. EKG 7/14: QTC 467 ms.  Acute hepatic encephalopathy - Resolved significantly. Refusing Lactulose. -  patient has been refusing oral and rectal lactulose intermittently. Ammonia level had come down to 53 but is increased to 70. Encourage lactulose-increased oral dose. Monitor clinically and with serial ammonia levels.  Hypokalemia - Replete. -Replace and follow. Magnesium 1.8 on 7/19.  Mild thrombocytopenia - Likely secondary to cirrhosis and alcohol abuse. Follow CBC in a.m.  Mild hyponatremia - Likely related to cirrhosis and volume overload. Stable.   ? UTI - Urine microscopy from 7/14  showed few bacteria, positive nitrite and 0-5 WBC. Urine culture suggestive of contamination. Completed 4 days of IV Rocephin and hence treatment for this indication.    DVT Prophylaxis: Lovenox  Code Status: FULL  Family Communication: No family at bedside.  Disposition Plan: Likely to rehab facility when stable for d/c. Not stable for discharge.   Consultants:  Interventional radiology.  Procedures:  Ultrasound-guided paracentesis 7/15: Drained 2.7 L of ascitic fluid.  Antimicrobials:  Ceftriaxone 7/17    Subjective Appears confused. Denies pain. Complains of feeling weak. As per RN, has been refusing lactulose and is confused.  Objective: Filed Vitals:   08/28/15 1947 08/28/15 2100 08/29/15 0535 08/29/15 0825  BP:  114/52 116/55   Pulse:  83 83   Temp:  98.7 F (37.1 C) 98.4 F (36.9 C)   TempSrc:  Oral Oral   Resp:  20 20   Height:      Weight:      SpO2: 98% 100% 100% 96%    Intake/Output Summary (Last 24 hours) at 08/29/15 1108 Last data filed at 08/28/15 1454  Gross per 24 hour  Intake      0 ml  Output   1650 ml  Net  -1650 ml   Filed Weights   08/22/15 1907 08/24/15 0000  Weight: 83.6 kg (184 lb 4.9 oz) 81.5 kg (179 lb 10.8 oz)    Exam: General: Pleasant middle-aged female sitting up comfortably propped up in bed.  Cardiovascular: RRR, no murmurs or rubs, 1+ pitting BLE edema. Telemetry: Sinus rhythm.  Respiratory: clear to auscultation bilaterally, no wheezes, no crackles . No increased work of breathing.  Abdomen: soft, nontender, mildly distended, normal bowel tones heard . Ascites + +.  Skin:  dry, no rashes  Musculoskeletal: no joint effusions, normal range of motion  Psychiatric:Confused.  Neurologic: Alert and oriented to self and partly to place. No focal neurological deficits. Asterixis +.   Data Reviewed: CBC:  Recent Labs Lab 08/22/15 1610 08/23/15 0530 08/28/15 0528 08/29/15 0519  WBC 11.1* 16.4* 12.8* 16.2*  NEUTROABS 8.2*   --   --   --   HGB 12.7 11.9* 12.0 13.2  HCT 37.7 36.4 35.7* 39.5  MCV 98.7 100.8* 99.2 99.2  PLT 170 191 130* 142*   Basic Metabolic Panel:  Recent Labs Lab 08/23/15 0530 08/27/15 0747 08/27/15 1734 08/28/15 0528 08/29/15 0514 08/29/15 0519  NA 134* 134* 134* 134*  --  135  K 4.0 2.6* 3.5 3.2*  --  3.1*  CL 93* 93* 99* 100*  --  99*  CO2 31 32 28 28  --  29  GLUCOSE 83 96 101* 101*  --  113*  BUN <5* 5* 6 8  --  11  CREATININE <0.30* 0.39* 0.47 0.39*  --  0.37*  CALCIUM 7.8* 8.2* 8.3* 8.4*  --  8.8*  MG  --  1.8  --   --  1.9  --    GFR: Estimated Creatinine Clearance: 75.1 mL/min (by C-G formula based on Cr of 0.37). Liver Function Tests:  Recent Labs Lab 08/22/15 1610 08/23/15 0530  AST 75* 78*  ALT 33 33  ALKPHOS 206* 200*  BILITOT 2.6* 3.1*  PROT 6.3* 6.6  ALBUMIN 3.1* 3.2*    Recent Labs Lab 08/22/15 1610  LIPASE 17    Recent Labs Lab 08/26/15 1317 08/27/15 0747 08/27/15 1734 08/28/15 0528 08/29/15 0606  AMMONIA 92* 124* 53* 70* 39*   Coagulation Profile: No results for input(s): INR, PROTIME in the last 168 hours. Cardiac Enzymes: No results for input(s): CKTOTAL, CKMB, CKMBINDEX, TROPONINI in the last 168 hours. BNP (last 3 results) No results for input(s): PROBNP in the last 8760 hours. HbA1C: No results for input(s): HGBA1C in the last 72 hours. CBG: No results for input(s): GLUCAP in the last 168 hours. Lipid Profile: No results for input(s): CHOL, HDL, LDLCALC, TRIG, CHOLHDL, LDLDIRECT in the last 72 hours. Thyroid Function Tests: No results for input(s): TSH, T4TOTAL, FREET4, T3FREE, THYROIDAB in the last 72 hours. Anemia Panel: No results for input(s): VITAMINB12, FOLATE, FERRITIN, TIBC, IRON, RETICCTPCT in the last 72 hours. Urine analysis:    Component Value Date/Time   COLORURINE AMBER* 08/22/2015 1740   APPEARANCEUR CLOUDY* 08/22/2015 1740   LABSPEC 1.026 08/22/2015 1740   PHURINE 6.0 08/22/2015 1740   GLUCOSEU NEGATIVE  08/22/2015 1740   HGBUR NEGATIVE 08/22/2015 1740   BILIRUBINUR MODERATE* 08/22/2015 1740   BILIRUBINUR moderate* 06/19/2015 1906   KETONESUR 40* 08/22/2015 1740   KETONESUR trace (5)* 06/19/2015 1906   PROTEINUR 30* 08/22/2015 1740   PROTEINUR negative 06/19/2015 1906   UROBILINOGEN >=8.0 06/19/2015 1906   UROBILINOGEN 0.2 09/14/2011 2141   NITRITE POSITIVE* 08/22/2015 1740   NITRITE Negative 06/19/2015 1906   LEUKOCYTESUR SMALL* 08/22/2015 1740   Sepsis Labs: (procalcitonin:4,lacticidven:4)  ) Recent Results (from the past 240 hour(s))  MRSA PCR Screening     Status: None   Collection Time: 08/22/15 11:30 PM  Result Value Ref Range Status   MRSA by PCR NEGATIVE NEGATIVE Final    Comment:        The GeneXpert MRSA Assay (FDA approved for NASAL specimens only), is one component of a comprehensive MRSA colonization surveillance program. It is  not intended to diagnose MRSA infection nor to guide or monitor treatment for MRSA infections.   Culture, Urine     Status: Abnormal   Collection Time: 08/25/15  2:14 PM  Result Value Ref Range Status   Specimen Description URINE, RANDOM  Final   Special Requests NONE  Final   Culture MULTIPLE SPECIES PRESENT, SUGGEST RECOLLECTION (A)  Final   Report Status 08/26/2015 FINAL  Final      Studies: No results found.  Scheduled Meds: . amantadine  100 mg Oral BID  . cefTRIAXone (ROCEPHIN)  IV  2 g Intravenous Q24H  . enoxaparin (LOVENOX) injection  40 mg Subcutaneous Q24H  . FLUoxetine  20 mg Oral Daily  . folic acid  1 mg Oral Daily  . furosemide  40 mg Oral Daily  . gabapentin  200 mg Oral TID  . lactulose  20 g Oral TID  . mometasone-formoterol  2 puff Inhalation BID  . multivitamin with minerals  1 tablet Oral Daily  . nicotine  21 mg Transdermal Daily  . oxybutynin  5 mg Oral QHS  . pantoprazole  40 mg Oral Daily  . risperiDONE  0.5 mg Oral QHS  . sodium chloride flush  3 mL Intravenous Q12H  .  spironolactone  100 mg Oral Daily  . thiamine  100 mg Oral Daily  . topiramate  100 mg Oral Daily    Continuous Infusions:    LOS: 7 days   Time spent: 25 minutes  Barnetta Chapel, MD. [facility] Pager [phone].  If 7PM-7AM, please contact night-coverage [email or web address] Password [email or web address] 08/29/2015, 11:08 AM

## 2015-08-30 DIAGNOSIS — K7031 Alcoholic cirrhosis of liver with ascites: Secondary | ICD-10-CM

## 2015-08-30 DIAGNOSIS — F1020 Alcohol dependence, uncomplicated: Secondary | ICD-10-CM | POA: Diagnosis not present

## 2015-08-30 DIAGNOSIS — K7011 Alcoholic hepatitis with ascites: Secondary | ICD-10-CM | POA: Diagnosis not present

## 2015-08-30 DIAGNOSIS — K7030 Alcoholic cirrhosis of liver without ascites: Secondary | ICD-10-CM | POA: Diagnosis not present

## 2015-08-30 LAB — BASIC METABOLIC PANEL
Anion gap: 7 (ref 5–15)
BUN: 12 mg/dL (ref 6–20)
CALCIUM: 8.9 mg/dL (ref 8.9–10.3)
CO2: 27 mmol/L (ref 22–32)
CREATININE: 0.43 mg/dL — AB (ref 0.44–1.00)
Chloride: 98 mmol/L — ABNORMAL LOW (ref 101–111)
GFR calc non Af Amer: >60 mL/min (ref >60)
Glucose, Bld: 102 mg/dL — ABNORMAL HIGH (ref 65–99)
Potassium: 4.6 mmol/L (ref 3.5–5.1)
SODIUM: 132 mmol/L — AB (ref 135–145)

## 2015-08-30 LAB — URINE CULTURE: Culture: NO GROWTH

## 2015-08-30 LAB — CBC
HCT: 40.5 % (ref 36.0–46.0)
Hemoglobin: 13.4 g/dL (ref 12.0–15.0)
MCH: 32.8 pg (ref 26.0–34.0)
MCHC: 33.1 g/dL (ref 30.0–36.0)
MCV: 99 fL (ref 78.0–100.0)
PLATELETS: 164 10*3/uL (ref 150–400)
RBC: 4.09 MIL/uL (ref 3.87–5.11)
RDW: 17.8 % — AB (ref 11.5–15.5)
WBC: 16.7 10*3/uL — ABNORMAL HIGH (ref 4.0–10.5)

## 2015-08-30 LAB — AMMONIA: AMMONIA: 45 umol/L — AB (ref 9–35)

## 2015-08-30 LAB — RENAL FUNCTION PANEL
Albumin: 3 g/dL — ABNORMAL LOW (ref 3.5–5.0)
Anion gap: 6 (ref 5–15)
BUN: 12 mg/dL (ref 6–20)
CO2: 27 mmol/L (ref 22–32)
Calcium: 8.9 mg/dL (ref 8.9–10.3)
Chloride: 98 mmol/L — ABNORMAL LOW (ref 101–111)
Creatinine, Ser: 0.46 mg/dL (ref 0.44–1.00)
GFR calc Af Amer: >60 mL/min (ref >60)
GFR calc non Af Amer: >60 mL/min (ref >60)
Glucose, Bld: 102 mg/dL — ABNORMAL HIGH (ref 65–99)
Phosphorus: 3.2 mg/dL (ref 2.5–4.6)
Potassium: 4.6 mmol/L (ref 3.5–5.1)
Sodium: 131 mmol/L — ABNORMAL LOW (ref 135–145)

## 2015-08-30 MED ORDER — FOLIC ACID 1 MG PO TABS: 1.0000 mg | ORAL_TABLET | Freq: Every day | ORAL | Status: DC

## 2015-08-30 MED ORDER — SPIRONOLACTONE 100 MG PO TABS: 100.0000 mg | ORAL_TABLET | Freq: Every day | ORAL | Status: DC

## 2015-08-30 MED ORDER — LACTULOSE 10 GM/15ML PO SOLN: 20.0000 g | Freq: Two times a day (BID) | ORAL | Status: DC

## 2015-08-30 MED ORDER — FUROSEMIDE 40 MG PO TABS: 40.0000 mg | ORAL_TABLET | Freq: Every day | ORAL | Status: DC

## 2015-08-30 MED ORDER — LORAZEPAM 0.5 MG PO TABS: 0.2500 mg | ORAL_TABLET | Freq: Three times a day (TID) | ORAL | Status: DC | PRN

## 2015-08-30 MED ORDER — ADULT MULTIVITAMIN W/MINERALS CH: 1.0000 | ORAL_TABLET | Freq: Every day | ORAL | Status: DC

## 2015-08-30 MED ORDER — THIAMINE HCL 100 MG PO TABS: 100.0000 mg | ORAL_TABLET | Freq: Every day | ORAL | Status: DC

## 2015-08-30 MED ORDER — GABAPENTIN 100 MG PO CAPS: 200.0000 mg | ORAL_CAPSULE | Freq: Three times a day (TID) | ORAL | Status: DC

## 2015-08-30 NOTE — Clinical Social Work Placement (Signed)
Patient is set to discharge to [facility] today. Patient & mother, Anne Black made aware. Discharge packet given to RN, Donetta Potts. [facility] called for transport for pickup at 2:00pm.     Lincoln Maxin, LCSW [facility] Clinical Social Worker cell #: [phone]    CLINICAL SOCIAL WORK PLACEMENT  NOTE  Date:  08/30/2015  Patient Details  Name: Anne Black MRN: [id number] Date of Birth: [birth year]  Clinical Social Work is seeking post-discharge placement for this patient at the Skilled  Nursing Facility level of care (*CSW will initial, date and re-position this form in  chart as items are completed):  Yes   Patient/family provided with [facility] Clinical Social Work Department's list of facilities offering this level of care within the geographic area requested by the patient (or if unable, by the patient's family).  Yes   Patient/family informed of their freedom to choose among providers that offer the needed level of care, that participate in Medicare, Medicaid or managed care program needed by the patient, have an available bed and are willing to accept the patient.  Yes   Patient/family informed of [facility]'s ownership interest in [facility] and [facility], as well as of the fact that they are under no obligation to receive care at these facilities.  PASRR submitted to EDS on 08/29/15     PASRR number received on 08/29/15     Existing PASRR number confirmed on       FL2 transmitted to all facilities in geographic area requested by pt/family on       FL2 transmitted to all facilities within larger geographic area on 08/26/15     Patient informed that his/her managed care company has contracts with or will negotiate with certain facilities, including the following:  [facility]     Yes   Patient/family informed of bed offers received.  Patient chooses bed at [facility]     Physician  recommends and patient chooses bed at      Patient to be transferred to [facility] on 08/30/15.  Patient to be transferred to facility by [facility]     Patient family notified on 08/30/15 of transfer.  Name of family member notified:  patient's mother, Anne Black at bedside     PHYSICIAN       Additional Comment:    _______________________________________________ Arlyss Repress, LCSW 08/30/2015, 1:05 PM

## 2015-08-30 NOTE — Progress Notes (Signed)
Patient discharged to [facility].  EMS transporting patient to facility.  Patient daughter at bedside.  Leaving with personal belongings.  3L [place].  No s/s of pain or distress.

## 2015-08-30 NOTE — Discharge Summary (Signed)
Physician Discharge Summary  Anne Black ZOX:[id number] DOB: 25-Apr-1960 DOA: 08/22/2015  PCP: No primary care provider on file.  Admit date: 08/22/2015 Discharge date: 08/30/2015  Time spent: Greater than 30 minutes  Recommendations for Outpatient Follow-up:  1. Follow up with PCP within one week. 2. Discharge patient to SNF [facility]).  Discharge Diagnoses:  Principal Problem:   Alcoholic hepatitis with ascites Active Problems:   Alcoholic cirrhosis (HCC)   Alcohol use disorder, severe, dependence (HCC)   Alcoholic cirrhosis of liver with ascites [facility])   Discharge Condition: Stable  Diet recommendation: Cardiac  Filed Weights   08/22/15 1907 08/24/15 0000  Weight: 83.6 kg (184 lb 4.9 oz) 81.5 kg (179 lb 10.8 oz)    History of present illness: 55 year old female with past medical history for alcohol abuse, cirrhosis with ascites s/p paracentesis in April of this year, alcoholic hepatitis, non-compliance with diuretics (Lasix) due to urinary incontinence as per patient, multiple sclerosis, anxiety, bipolar disorder and ADHD. Patient presented with several week history of worsening dyspnea and abdominal distention. She admitted to heavy alcohol use.  Hospital Course: Patient was admitted for further assessment and management. Patient was counseled to quit alcohol use. Patient was started on alcohol withdrawal protocol. Patient underwent paracentesis on 08/23/2015 and 2.7 Liters of yellowish fluid was drained. Patient has been restarted on lasix. Spironolactone has been added. Electrolytes were monitored and replete during the hospital. Patient will be discharged to SNF [facility])  Procedures:  Large volume paracentesis.  Consultations:  Interventional Radiology  Discharge Exam: Filed Vitals:   08/29/15 2137 08/30/15 0523  BP: 114/59 118/66  Pulse: 96 94  Temp: 98.9 F (37.2 C) 97.7 F (36.5 C)  Resp: 19 20    General: Not in distress. AAO X 3. Cardiovascular:  S1S2 Respiratory: Clear to auscultation.  Discharge Instructions   Discharge Instructions    Diet - low sodium heart healthy    Complete by:  As directed      Increase activity slowly    Complete by:  As directed           Current Discharge Medication List    START taking these medications   Details  folic acid (FOLVITE) 1 MG tablet Take 1 tablet (1 mg total) by mouth daily. Qty: 30 tablet, Refills: 1    lactulose (CHRONULAC) 10 GM/15ML solution Take 30 mLs (20 g total) by mouth 2 (two) times daily. Qty: 240 mL, Refills: 0    LORazepam (ATIVAN) 0.5 MG tablet Take 0.5 tablets (0.25 mg total) by mouth every 8 (eight) hours as needed for anxiety. Qty: 12 tablet, Refills: 0    Multiple Vitamin (MULTIVITAMIN WITH MINERALS) TABS tablet Take 1 tablet by mouth daily. Qty: 30 tablet, Refills: 1    spironolactone (ALDACTONE) 100 MG tablet Take 1 tablet (100 mg total) by mouth daily. Qty: 30 tablet, Refills: 1    thiamine 100 MG tablet Take 1 tablet (100 mg total) by mouth daily. Qty: 30 tablet, Refills: 1      CONTINUE these medications which have CHANGED   Details  furosemide (LASIX) 40 MG tablet Take 1 tablet (40 mg total) by mouth daily. Qty: 30 tablet, Refills: 1    gabapentin (NEURONTIN) 100 MG capsule Take 2 capsules (200 mg total) by mouth 3 (three) times daily. Qty: 90 capsule, Refills: 0      CONTINUE these medications which have NOT CHANGED   Details  albuterol (PROVENTIL HFA;VENTOLIN HFA) 108 (90 Base) MCG/ACT inhaler Inhale 2 puffs  into the lungs every 6 (six) hours as needed for wheezing (Must return to clinic for future refills.). Qty: 1 Inhaler, Refills: 0    amantadine (SYMMETREL) 100 MG capsule Take 1 capsule (100 mg total) by mouth 2 (two) times daily. Qty: 30 capsule, Refills: 0    Fluticasone-Salmeterol (ADVAIR) 250-50 MCG/DOSE AEPB Inhale 1 puff into the lungs 2 (two) times daily. Must return to clinic for future refills. Qty: 60 each, Refills: 0     oxybutynin (DITROPAN-XL) 5 MG 24 hr tablet Take 5 mg by mouth at bedtime.     pantoprazole (PROTONIX) 40 MG tablet Take 1 tablet (40 mg total) by mouth daily. Must return to clinic for future refills. Qty: 30 tablet, Refills: 0    risperiDONE (RISPERDAL) 0.5 MG tablet Take 1 tablet (0.5 mg total) by mouth at bedtime. Qty: 30 tablet, Refills: 0    FLUoxetine (PROZAC) 20 MG capsule Refills: 0    nicotine (NICODERM CQ - DOSED IN MG/24 HOURS) 21 mg/24hr patch Place 1 patch (21 mg total) onto the skin daily. Qty: 28 patch, Refills: 0    rifaximin (XIFAXAN) 550 MG TABS tablet Take 1 tablet (550 mg total) by mouth 2 (two) times daily. Qty: 60 tablet, Refills: 0    topiramate (TOPAMAX) 100 MG tablet Take 1 tablet (100 mg total) by mouth daily. Qty: 30 tablet, Refills: 0      STOP taking these medications     diphenhydrAMINE (BENADRYL) 25 MG tablet      hydrOXYzine (ATARAX/VISTARIL) 25 MG tablet      MELATONIN PO      lidocaine (LIDODERM) 5 %      ondansetron (ZOFRAN-ODT) 4 MG disintegrating tablet        No Known Allergies    The results of significant diagnostics from this hospitalization (including imaging, microbiology, ancillary and laboratory) are listed below for reference.    Significant Diagnostic Studies: Dg Chest 2 View  08/22/2015  CLINICAL DATA:  Fevers of unknown origin EXAM: CHEST  2 VIEW COMPARISON:  05/28/2015 FINDINGS: The heart size and mediastinal contours are within normal limits. Both lungs are clear. The visualized skeletal structures are unremarkable. IMPRESSION: No active cardiopulmonary disease. Electronically Signed   By: Alcide Clever M.D.   On: 08/22/2015 17:42   Mr Laqueta Jean ZO Contrast  08/02/2015  CLINICAL DATA:  Initial evaluation for multiple sclerosis. EXAM: MRI HEAD WITHOUT AND WITH CONTRAST MRI CERVICAL SPINE WITHOUT AND WITH CONTRAST TECHNIQUE: Multiplanar, multiecho pulse sequences of the brain and surrounding structures, and cervical spine,  to include the craniocervical junction and cervicothoracic junction, were obtained without and with intravenous contrast. CONTRAST:  18mL MULTIHANCE GADOBENATE DIMEGLUMINE 529 MG/ML IV SOLN COMPARISON:  None available. FINDINGS: MRI HEAD FINDINGS Study mildly degraded by motion artifact. Diffuse prominence of the CSF containing spaces is compatible with generalized cerebral atrophy, advanced for patient age. Multiple patchy and confluent T2/FLAIR hyperintense foci present within the periventricular, deep, and subcortical white matter of both cerebral hemispheres. A few of these foci are oriented perpendicular to the lateral ventricles. There is involvement of the corpus callosum. Findings most likely related to provided history of underlying multiple sclerosis. No infratentorial lesions identified. No abnormal restricted diffusion or enhancement to suggest active demyelination. No evidence for acute infarct. Gray-white matter differentiation maintained. Major intracranial vascular flow voids are preserved. No acute or chronic intracranial hemorrhage. No areas of chronic infarction. No mass lesion, midline shift, or mass effect. No hydrocephalus. No extra-axial fluid collection. No  abnormal enhancement. Craniocervical junction within normal limits. Visualized upper cervical spine unremarkable. Pituitary gland normal. No acute abnormality about the globes and orbits. Paranasal sinuses are clear. Minimal scattered opacity within the right mastoid air cells. Inner ear structures normal. Bone marrow signal intensity within normal limits. No scalp soft tissue abnormality. MRI CERVICAL SPINE FINDINGS Alignment: Vertebral bodies normally aligned with preservation of the normal cervical lordosis. No listhesis or subluxation. Vertebrae: Vertebral body heights well maintained. No evidence for acute or chronic fracture. Signal intensity within the vertebral body bone marrow is normal. Cord: Signal intensity within the cervical  spinal cord is normal. No abnormal cord signal intensity to suggest underlying demyelinating disease. No abnormal enhancement. Posterior Fossa, vertebral arteries, paraspinal tissues: Visualized portions of the brain and posterior fossa are within normal limits. Craniocervical junction normal. Paraspinous soft tissues demonstrate no acute abnormality. No prevertebral or paraspinous edema. Normal intravascular flow voids present within the vertebral arteries bilaterally. Disc levels: C2-C3: Negative. C3-C4:  Negative. C4-C5: Mild degenerative disc bulge with bilateral uncovertebral hypertrophy. No significant stenosis. C5-C6:  Minimal bilateral uncovertebral spurring.  No stenosis. C6-C7: Broad-based posterior disc bulge with bilateral uncovertebral spurring. Minimal flattening of the ventral thecal sac without significant stenosis. Foramina remain patent. C7-T1:  Mild bilateral uncovertebral spurring without stenosis. Visualized portions of the upper thoracic spine demonstrate no acute abnormality. No significant stenosis. IMPRESSION: MRI BRAIN IMPRESSION: 1. Patchy and confluent cerebral white matter disease as above, most likely related to patient's history of underlying multiple sclerosis. No evidence for active demyelination. 2. Advanced cerebral atrophy for age, also likely related underlying demyelinating disease. 3. No other acute intracranial process identified. MRI CERVICAL SPINE IMPRESSION: 1. No MRI evidence for underlying demyelinating disease within the cervical spinal cord. 2. Shallow degenerative disc bulge at C6-7 without significant stenosis. 3. Additional relatively minor multilevel degenerative changes as above. No significant stenosis. Electronically Signed   By: Rise Mu M.D.   On: 08/02/2015 17:17   Mr Cervical Spine W Wo Contrast  08/02/2015  CLINICAL DATA:  Initial evaluation for multiple sclerosis. EXAM: MRI HEAD WITHOUT AND WITH CONTRAST MRI CERVICAL SPINE WITHOUT AND WITH  CONTRAST TECHNIQUE: Multiplanar, multiecho pulse sequences of the brain and surrounding structures, and cervical spine, to include the craniocervical junction and cervicothoracic junction, were obtained without and with intravenous contrast. CONTRAST:  18mL MULTIHANCE GADOBENATE DIMEGLUMINE 529 MG/ML IV SOLN COMPARISON:  None available. FINDINGS: MRI HEAD FINDINGS Study mildly degraded by motion artifact. Diffuse prominence of the CSF containing spaces is compatible with generalized cerebral atrophy, advanced for patient age. Multiple patchy and confluent T2/FLAIR hyperintense foci present within the periventricular, deep, and subcortical white matter of both cerebral hemispheres. A few of these foci are oriented perpendicular to the lateral ventricles. There is involvement of the corpus callosum. Findings most likely related to provided history of underlying multiple sclerosis. No infratentorial lesions identified. No abnormal restricted diffusion or enhancement to suggest active demyelination. No evidence for acute infarct. Gray-white matter differentiation maintained. Major intracranial vascular flow voids are preserved. No acute or chronic intracranial hemorrhage. No areas of chronic infarction. No mass lesion, midline shift, or mass effect. No hydrocephalus. No extra-axial fluid collection. No abnormal enhancement. Craniocervical junction within normal limits. Visualized upper cervical spine unremarkable. Pituitary gland normal. No acute abnormality about the globes and orbits. Paranasal sinuses are clear. Minimal scattered opacity within the right mastoid air cells. Inner ear structures normal. Bone marrow signal intensity within normal limits. No scalp soft tissue abnormality. MRI CERVICAL SPINE  FINDINGS Alignment: Vertebral bodies normally aligned with preservation of the normal cervical lordosis. No listhesis or subluxation. Vertebrae: Vertebral body heights well maintained. No evidence for acute or chronic  fracture. Signal intensity within the vertebral body bone marrow is normal. Cord: Signal intensity within the cervical spinal cord is normal. No abnormal cord signal intensity to suggest underlying demyelinating disease. No abnormal enhancement. Posterior Fossa, vertebral arteries, paraspinal tissues: Visualized portions of the brain and posterior fossa are within normal limits. Craniocervical junction normal. Paraspinous soft tissues demonstrate no acute abnormality. No prevertebral or paraspinous edema. Normal intravascular flow voids present within the vertebral arteries bilaterally. Disc levels: C2-C3: Negative. C3-C4:  Negative. C4-C5: Mild degenerative disc bulge with bilateral uncovertebral hypertrophy. No significant stenosis. C5-C6:  Minimal bilateral uncovertebral spurring.  No stenosis. C6-C7: Broad-based posterior disc bulge with bilateral uncovertebral spurring. Minimal flattening of the ventral thecal sac without significant stenosis. Foramina remain patent. C7-T1:  Mild bilateral uncovertebral spurring without stenosis. Visualized portions of the upper thoracic spine demonstrate no acute abnormality. No significant stenosis. IMPRESSION: MRI BRAIN IMPRESSION: 1. Patchy and confluent cerebral white matter disease as above, most likely related to patient's history of underlying multiple sclerosis. No evidence for active demyelination. 2. Advanced cerebral atrophy for age, also likely related underlying demyelinating disease. 3. No other acute intracranial process identified. MRI CERVICAL SPINE IMPRESSION: 1. No MRI evidence for underlying demyelinating disease within the cervical spinal cord. 2. Shallow degenerative disc bulge at C6-7 without significant stenosis. 3. Additional relatively minor multilevel degenerative changes as above. No significant stenosis. Electronically Signed   By: Rise Mu M.D.   On: 08/02/2015 17:17   US Paracentesis  08/23/2015  INDICATION: History of alcohol  abuse and cirrhosis now with symptomatic intra-abdominal ascites. Request made for ultrasound-guided paracentesis. EXAM: ULTRASOUND-GUIDED PARACENTESIS COMPARISON:  None. MEDICATIONS: 10 cc 1% lidocaine COMPLICATIONS: None immediate. TECHNIQUE: Informed written consent was obtained from the patient after a discussion of the risks, benefits and alternatives to treatment. A timeout was performed prior to the initiation of the procedure. Initial ultrasound scanning demonstrates a large amount of ascites within the right lower abdominal quadrant. The right lower abdomen was prepped and draped in the usual sterile fashion. 1% lidocaine with epinephrine was used for local anesthesia. Under direct ultrasound guidance, a 19 gauge, 7-cm, Yueh catheter was introduced. An ultrasound image was saved for documentation purposed. The paracentesis was performed. The catheter was removed and a dressing was applied. The patient tolerated the procedure well without immediate post procedural complication. FINDINGS: A total of approximately 2.7 liters of serous fluid was removed. IMPRESSION: Successful ultrasound-guided paracentesis yielding 2.7 liters of peritoneal fluid. Read by:  Robet Leu [facility] Electronically Signed   By: Simonne Come M.D.   On: 08/23/2015 09:48    Microbiology: Recent Results (from the past 240 hour(s))  MRSA PCR Screening     Status: None   Collection Time: 08/22/15 11:30 PM  Result Value Ref Range Status   MRSA by PCR NEGATIVE NEGATIVE Final    Comment:        The GeneXpert MRSA Assay (FDA approved for NASAL specimens only), is one component of a comprehensive MRSA colonization surveillance program. It is not intended to diagnose MRSA infection nor to guide or monitor treatment for MRSA infections.   Culture, Urine     Status: Abnormal   Collection Time: 08/25/15  2:14 PM  Result Value Ref Range Status   Specimen Description URINE, RANDOM  Final   Special Requests  NONE  Final   Culture  MULTIPLE SPECIES PRESENT, SUGGEST RECOLLECTION (A)  Final   Report Status 08/26/2015 FINAL  Final     Labs: Basic Metabolic Panel:  Recent Labs Lab 08/27/15 0747 08/27/15 1734 08/28/15 0528 08/29/15 0514 08/29/15 0519 08/29/15 1732 08/30/15 0620  NA 134* 134* 134*  --  135 132* 131*  132*  K 2.6* 3.5 3.2*  --  3.1* 3.5 4.6  4.6  CL 93* 99* 100*  --  99* 97* 98*  98*  CO2 32 28 28  --  29 27 27  27   GLUCOSE 96 101* 101*  --  113* 104* 102*  102*  BUN 5* 6 8  --  11 11 12  12   CREATININE 0.39* 0.47 0.39*  --  0.37* 0.32* 0.46  0.43*  CALCIUM 8.2* 8.3* 8.4*  --  8.8* 8.7* 8.9  8.9  MG 1.8  --   --  1.9  --   --   --   PHOS  --   --   --   --   --   --  3.2   Liver Function Tests:  Recent Labs Lab 08/30/15 0620  ALBUMIN 3.0*   No results for input(s): LIPASE, AMYLASE in the last 168 hours.  Recent Labs Lab 08/27/15 0747 08/27/15 1734 08/28/15 0528 08/29/15 0606 08/30/15 0620  AMMONIA 124* 53* 70* 39* 45*   CBC:  Recent Labs Lab 08/28/15 0528 08/29/15 0519 08/30/15 0620  WBC 12.8* 16.2* 16.7*  HGB 12.0 13.2 13.4  HCT 35.7* 39.5 40.5  MCV 99.2 99.2 99.0  PLT 130* 142* 164   Cardiac Enzymes: No results for input(s): CKTOTAL, CKMB, CKMBINDEX, TROPONINI in the last 168 hours. BNP: BNP (last 3 results)  Recent Labs  06/10/15 1314  BNP 68.1    ProBNP (last 3 results) No results for input(s): PROBNP in the last 8760 hours.  CBG: No results for input(s): GLUCAP in the last 168 hours.     Signed:  Berton Mount, MD  [facility] Pager #: [phone] 7PM-7AM contact night coverage as above

## 2015-08-30 NOTE — Progress Notes (Signed)
Called report to [facility] and [place], [phone].  Report given to [place], [place].

## 2015-09-18 ENCOUNTER — Encounter (HOSPITAL_COMMUNITY): Payer: Self-pay | Admitting: Emergency Medicine

## 2015-09-18 ENCOUNTER — Emergency Department (HOSPITAL_COMMUNITY)
Admission: EM | Admit: 2015-09-18 | Discharge: 2015-09-18 | Disposition: A | Discharge status: Home / Self Care | Payer: Managed Care, Other (non HMO) | Attending: Dermatology | Admitting: Dermatology

## 2015-09-18 DIAGNOSIS — Z5321 Procedure and treatment not carried out due to patient leaving prior to being seen by health care provider: Secondary | ICD-10-CM | POA: Diagnosis not present

## 2015-09-18 DIAGNOSIS — Z79899 Other long term (current) drug therapy: Secondary | ICD-10-CM | POA: Diagnosis not present

## 2015-09-18 DIAGNOSIS — F17210 Nicotine dependence, cigarettes, uncomplicated: Secondary | ICD-10-CM | POA: Diagnosis not present

## 2015-09-18 DIAGNOSIS — R1084 Generalized abdominal pain: Secondary | ICD-10-CM | POA: Diagnosis present

## 2015-09-18 NOTE — ED Triage Notes (Signed)
Patient presents for generalized abdominal pain, nausea, emesis x2 episodes and diarrhea x2 episodes starting today. Denies fever.

## 2015-09-18 NOTE — ED Notes (Signed)
I called patient to collect blood ans she stated no she is leaving.  I made the nurse aware

## 2015-09-29 ENCOUNTER — Ambulatory Visit: Payer: Self-pay | Admitting: Family Medicine

## 2015-09-30 ENCOUNTER — Emergency Department (HOSPITAL_COMMUNITY)
Admission: EM | Admit: 2015-09-30 | Discharge: 2015-10-01 | Discharge status: Against Medical Advice | Payer: Managed Care, Other (non HMO) | Attending: Emergency Medicine | Admitting: Emergency Medicine

## 2015-09-30 ENCOUNTER — Encounter (HOSPITAL_COMMUNITY): Payer: Self-pay | Admitting: Emergency Medicine

## 2015-09-30 ENCOUNTER — Emergency Department (HOSPITAL_COMMUNITY): Payer: Managed Care, Other (non HMO)

## 2015-09-30 DIAGNOSIS — F909 Attention-deficit hyperactivity disorder, unspecified type: Secondary | ICD-10-CM | POA: Diagnosis not present

## 2015-09-30 DIAGNOSIS — Z79899 Other long term (current) drug therapy: Secondary | ICD-10-CM | POA: Insufficient documentation

## 2015-09-30 DIAGNOSIS — E876 Hypokalemia: Secondary | ICD-10-CM | POA: Insufficient documentation

## 2015-09-30 DIAGNOSIS — F17210 Nicotine dependence, cigarettes, uncomplicated: Secondary | ICD-10-CM | POA: Diagnosis not present

## 2015-09-30 DIAGNOSIS — M25551 Pain in right hip: Secondary | ICD-10-CM | POA: Diagnosis present

## 2015-09-30 LAB — COMPREHENSIVE METABOLIC PANEL
ALT: 28 U/L (ref 14–54)
ANION GAP: 10 (ref 5–15)
AST: 54 U/L — ABNORMAL HIGH (ref 15–41)
Albumin: 2.9 g/dL — ABNORMAL LOW (ref 3.5–5.0)
Alkaline Phosphatase: 123 U/L (ref 38–126)
BUN: <5 mg/dL — ABNORMAL LOW (ref 6–20)
CHLORIDE: 101 mmol/L (ref 101–111)
CO2: 27 mmol/L (ref 22–32)
Calcium: 8.3 mg/dL — ABNORMAL LOW (ref 8.9–10.3)
Creatinine, Ser: 0.49 mg/dL (ref 0.44–1.00)
GFR calc non Af Amer: >60 mL/min (ref >60)
Glucose, Bld: 130 mg/dL — ABNORMAL HIGH (ref 65–99)
Potassium: 2.3 mmol/L — CL (ref 3.5–5.1)
SODIUM: 138 mmol/L (ref 135–145)
Total Bilirubin: 1.4 mg/dL — ABNORMAL HIGH (ref 0.3–1.2)
Total Protein: 6.4 g/dL — ABNORMAL LOW (ref 6.5–8.1)

## 2015-09-30 LAB — CBC WITH DIFFERENTIAL/PLATELET
BASOS ABS: 0.1 10*3/uL (ref 0.0–0.1)
BASOS PCT: 1 %
EOS ABS: 0.3 10*3/uL (ref 0.0–0.7)
EOS PCT: 3 %
HCT: 36.7 % (ref 36.0–46.0)
HEMOGLOBIN: 12.6 g/dL (ref 12.0–15.0)
LYMPHS ABS: 1.8 10*3/uL (ref 0.7–4.0)
Lymphocytes Relative: 22 %
MCH: 33 pg (ref 26.0–34.0)
MCHC: 34.3 g/dL (ref 30.0–36.0)
MCV: 96.1 fL (ref 78.0–100.0)
Monocytes Absolute: 0.6 10*3/uL (ref 0.1–1.0)
Monocytes Relative: 8 %
NEUTROS PCT: 66 %
Neutro Abs: 5.4 10*3/uL (ref 1.7–7.7)
PLATELETS: 182 10*3/uL (ref 150–400)
RBC: 3.82 MIL/uL — AB (ref 3.87–5.11)
RDW: 16.1 % — ABNORMAL HIGH (ref 11.5–15.5)
WBC: 8.1 10*3/uL (ref 4.0–10.5)

## 2015-09-30 LAB — RAPID URINE DRUG SCREEN, HOSP PERFORMED
AMPHETAMINES: NOT DETECTED
BARBITURATES: NOT DETECTED
BENZODIAZEPINES: POSITIVE — AB
Cocaine: NOT DETECTED
OPIATES: NOT DETECTED
Tetrahydrocannabinol: NOT DETECTED

## 2015-09-30 LAB — ETHANOL: ALCOHOL ETHYL (B): 143 mg/dL — AB (ref <5)

## 2015-09-30 LAB — MAGNESIUM: Magnesium: 1.9 mg/dL (ref 1.7–2.4)

## 2015-09-30 MED ORDER — POTASSIUM CHLORIDE 10 MEQ/100ML IV SOLN
10.0000 meq | INTRAVENOUS | Status: DC
  Administered 2015-10-01: 10 meq via INTRAVENOUS
  Filled 2015-09-30: qty 100

## 2015-09-30 MED ORDER — LORAZEPAM 1 MG PO TABS: 1.0000 mg | ORAL_TABLET | Freq: Four times a day (QID) | ORAL | Status: DC | PRN

## 2015-09-30 MED ORDER — LORAZEPAM 2 MG/ML IJ SOLN: 1.0000 mg | Freq: Four times a day (QID) | INTRAMUSCULAR | Status: DC | PRN

## 2015-09-30 MED ORDER — POTASSIUM CHLORIDE 10 MEQ/100ML IV SOLN
10.0000 meq | Freq: Once | INTRAVENOUS | Status: AC
  Administered 2015-09-30: 10 meq via INTRAVENOUS
  Filled 2015-09-30: qty 100

## 2015-09-30 MED ORDER — ADULT MULTIVITAMIN W/MINERALS CH: 1.0000 | ORAL_TABLET | Freq: Every day | ORAL | Status: DC

## 2015-09-30 MED ORDER — POTASSIUM CHLORIDE CRYS ER 20 MEQ PO TBCR
40.0000 meq | EXTENDED_RELEASE_TABLET | Freq: Once | ORAL | Status: AC
  Administered 2015-09-30: 40 meq via ORAL
  Filled 2015-09-30: qty 2

## 2015-09-30 MED ORDER — LORAZEPAM 2 MG/ML IJ SOLN
1.0000 mg | Freq: Once | INTRAMUSCULAR | Status: AC
  Administered 2015-09-30: 1 mg via INTRAVENOUS
  Filled 2015-09-30: qty 1

## 2015-09-30 MED ORDER — VITAMIN B-1 100 MG PO TABS: 100.0000 mg | ORAL_TABLET | Freq: Every day | ORAL | Status: DC

## 2015-09-30 MED ORDER — DIPHENHYDRAMINE HCL 50 MG/ML IJ SOLN: 12.5000 mg | Freq: Once | INTRAMUSCULAR | Status: DC

## 2015-09-30 MED ORDER — FOLIC ACID 1 MG PO TABS: 1.0000 mg | ORAL_TABLET | Freq: Every day | ORAL | Status: DC

## 2015-09-30 MED ORDER — DIPHENHYDRAMINE HCL 50 MG/ML IJ SOLN
12.5000 mg | Freq: Once | INTRAMUSCULAR | Status: AC
  Administered 2015-09-30: 12.5 mg via INTRAVENOUS
  Filled 2015-09-30: qty 1

## 2015-09-30 MED ORDER — METOCLOPRAMIDE HCL 5 MG/ML IJ SOLN
5.0000 mg | Freq: Once | INTRAMUSCULAR | Status: AC
  Administered 2015-09-30: 5 mg via INTRAVENOUS
  Filled 2015-09-30: qty 2

## 2015-09-30 MED ORDER — THIAMINE HCL 100 MG/ML IJ SOLN: 100.0000 mg | Freq: Every day | INTRAMUSCULAR | Status: DC

## 2015-09-30 NOTE — ED Triage Notes (Signed)
Patient here with complaints of bilateral hip pain 10/10. ETOH. Fall 2 days ago, denies LOC.

## 2015-09-30 NOTE — ED Notes (Signed)
[facility] Expected date:  Expected time:  Means of arrival:  Comments: EMS- 55yo F, Bilateral hip pain/ fall x 2 days ago/ETOH

## 2015-09-30 NOTE — ED Notes (Signed)
Potassium rate turned down due to patient complaints of medication "burning".  Pt tolerating at 50 ml/hr.

## 2015-09-30 NOTE — ED Provider Notes (Addendum)
[facility]-EMERGENCY DEPT Provider Note   CSN: [id number] Arrival date & time: 09/30/15  1745     History   Chief Complaint Chief Complaint  Patient presents with  . Hip Pain    detox    HPI Anne Black is a 55 y.o. female.  [age] year old female presents with pain to the lower back and bilateral hips after falling out of bed this morning. States that she has a history of alcoholism and has been drinking more frequently. Recently left AMA from rehabilitation a few days ago. Denies any head trauma from today's fall. States she is able to ambulate. Pain is characterized as sharp and worse when she stands. Denies any bowel or bladder dysfunction. No abdominal or chest discomfort. Does have a history of cirrhosis and has noticed some increased abdominal girth. Denies any fever or chills. No black or bloody stools.      Past Medical History:  Diagnosis Date  . ADHD (attention deficit hyperactivity disorder)   . Anxiety   . Arthritis   . Bipolar disorder (HCC)   . Migraine   . Multiple sclerosis (HCC)   . Osteoporosis     Patient Active Problem List   Diagnosis Date Noted  . Alcoholic cirrhosis of liver with ascites (HCC) 08/22/2015  . Bipolar disorder, current episode mixed, moderate (HCC) 08/01/2015  . Alcohol use disorder, severe, dependence (HCC) 07/31/2015  . Alcoholic cirrhosis (HCC) 05/31/2015  . Alcoholic hepatitis with ascites 05/31/2015  . Macrocytic anemia- due to alcohol abuse with normal B12 & folate levels 05/31/2015  . Severe protein-calorie malnutrition (HCC) 05/31/2015  . Hypokalemia 05/26/2015  . Encephalopathy, hepatic (HCC) 05/26/2015  . Anxiety 05/18/2015  . Sepsis (HCC) 05/18/2015  . UTI (lower urinary tract infection) 05/18/2015  . Stimulant abuse 12/27/2014  . Nicotine abuse 12/27/2014  . Hyperprolactinemia (HCC) 11/15/2014  . Dyslipidemia   . Noncompliance with therapeutic plan 04/04/2013  . Unspecified hereditary and idiopathic peripheral  neuropathy 05/01/2012  . GERD (gastroesophageal reflux disease) 05/01/2012  . Osteoarthrosis, unspecified whether generalized or localized, involving lower leg 05/01/2012  . MS (multiple sclerosis) (HCC) 09/14/2011    Past Surgical History:  Procedure Laterality Date  . CESAREAN SECTION  [id number]  . MYRINGOTOMY WITH TUBE PLACEMENT Bilateral   . TONSILLECTOMY      OB History    No data available       Home Medications    Prior to Admission medications   Medication Sig Start Date End Date Taking? Authorizing Provider  albuterol (PROVENTIL HFA;VENTOLIN HFA) 108 (90 Base) MCG/ACT inhaler Inhale 2 puffs into the lungs every 6 (six) hours as needed for wheezing (Must return to clinic for future refills.). 08/06/15   Ofilia NeasMichael L Clark, PA-C  amantadine (SYMMETREL) 100 MG capsule Take 1 capsule (100 mg total) by mouth 2 (two) times daily. 08/04/15   Oneta Rackanika N Lewis, NP  FLUoxetine (PROZAC) 20 MG capsule  08/04/15   Historical Provider, MD  Fluticasone-Salmeterol (ADVAIR) 250-50 MCG/DOSE AEPB Inhale 1 puff into the lungs 2 (two) times daily. Must return to clinic for future refills. 08/06/15   Ofilia NeasMichael L Clark, PA-C  folic acid (FOLVITE) 1 MG tablet Take 1 tablet (1 mg total) by mouth daily. 08/30/15   Barnetta ChapelSylvester I Ogbata, MD  furosemide (LASIX) 40 MG tablet Take 1 tablet (40 mg total) by mouth daily. 08/30/15   Barnetta ChapelSylvester I Ogbata, MD  gabapentin (NEURONTIN) 100 MG capsule Take 2 capsules (200 mg total) by mouth 3 (three) times daily. 08/30/15  Barnetta Chapel, MD  lactulose (CHRONULAC) 10 GM/15ML solution Take 30 mLs (20 g total) by mouth 2 (two) times daily. 08/30/15   Barnetta Chapel, MD  LORazepam (ATIVAN) 0.5 MG tablet Take 0.5 tablets (0.25 mg total) by mouth every 8 (eight) hours as needed for anxiety. 08/30/15   Barnetta Chapel, MD  Multiple Vitamin (MULTIVITAMIN WITH MINERALS) TABS tablet Take 1 tablet by mouth daily. 08/30/15   Barnetta Chapel, MD  nicotine (NICODERM CQ - DOSED IN  MG/24 HOURS) 21 mg/24hr patch Place 1 patch (21 mg total) onto the skin daily. Patient not taking: Reported on 08/22/2015 08/04/15   Oneta Rack, NP  oxybutynin (DITROPAN-XL) 5 MG 24 hr tablet Take 5 mg by mouth at bedtime.  08/06/15 08/05/16  Historical Provider, MD  pantoprazole (PROTONIX) 40 MG tablet Take 1 tablet (40 mg total) by mouth daily. Must return to clinic for future refills. 08/06/15   Ofilia Neas, PA-C  rifaximin (XIFAXAN) 550 MG TABS tablet Take 1 tablet (550 mg total) by mouth 2 (two) times daily. Patient not taking: Reported on 08/22/2015 06/05/15   Catarina Hartshorn, MD  risperiDONE (RISPERDAL) 0.5 MG tablet Take 1 tablet (0.5 mg total) by mouth at bedtime. 08/04/15   Oneta Rack, NP  spironolactone (ALDACTONE) 100 MG tablet Take 1 tablet (100 mg total) by mouth daily. 08/30/15   Barnetta Chapel, MD  thiamine 100 MG tablet Take 1 tablet (100 mg total) by mouth daily. 08/30/15   Barnetta Chapel, MD  topiramate (TOPAMAX) 100 MG tablet Take 1 tablet (100 mg total) by mouth daily. 02/21/15   Thermon Leyland, NP    Family History Family History  Problem Relation Age of Onset  . Heart disease Mother   . Heart disease Father   . Hypertension Father     Social History Social History  Substance Use Topics  . Smoking status: Current Every Day Smoker    Packs/day: 0.75    Years: 20.00    Types: Cigarettes  . Smokeless tobacco: Never Used     Comment: is cutting back, not in the car, house  . Alcohol use 4.8 oz/week    2 Glasses of wine, 2 Cans of beer, 4 Standard drinks or equivalent per week     Comment: "I'm a social drinker." "I drink a glass of wine nightly."     Allergies   Review of patient's allergies indicates no known allergies.   Review of Systems Review of Systems  All other systems reviewed and are negative.    Physical Exam Updated Vital Signs BP 107/67 (BP Location: Left Arm)   Pulse 91   Temp 98.3 F (36.8 C) (Oral)   Resp 16   SpO2 92%    Physical Exam  Constitutional: She is oriented to person, place, and time. She appears well-developed and well-nourished.  Non-toxic appearance. No distress.  HENT:  Head: Normocephalic and atraumatic.  Eyes: Conjunctivae, EOM and lids are normal. Pupils are equal, round, and reactive to light.  Neck: Normal range of motion. Neck supple. No tracheal deviation present. No thyroid mass present.  Cardiovascular: Normal rate, regular rhythm and normal heart sounds.  Exam reveals no gallop.   No murmur heard. Pulmonary/Chest: Effort normal and breath sounds normal. No stridor. No respiratory distress. She has no decreased breath sounds. She has no wheezes. She has no rhonchi. She has no rales.  Abdominal: Soft. Normal appearance and bowel sounds are normal. She exhibits no  distension. There is no tenderness. There is no rebound and no CVA tenderness.  Musculoskeletal: Normal range of motion. She exhibits no edema or tenderness.       Back:  Neurological: She is alert and oriented to person, place, and time. She has normal strength. No cranial nerve deficit or sensory deficit. GCS eye subscore is 4. GCS verbal subscore is 5. GCS motor subscore is 6.  Skin: Skin is warm and dry. No abrasion and no rash noted.  Psychiatric: Her affect is blunt. Her speech is delayed. She is slowed. She expresses no suicidal plans and no homicidal plans.  Nursing note and vitals reviewed.    ED Treatments / Results  Labs (all labs ordered are listed, but only abnormal results are displayed) Labs Reviewed  CBC WITH DIFFERENTIAL/PLATELET  ETHANOL  URINE RAPID DRUG SCREEN, HOSP PERFORMED  COMPREHENSIVE METABOLIC PANEL    EKG  EKG Interpretation None       Radiology No results found.  Procedures Procedures (including critical care time)  Medications Ordered in ED Medications - No data to display   Initial Impression / Assessment and Plan / ED Course  I have reviewed the triage vital signs and  the nursing notes.  Pertinent labs & imaging results that were available during my care of the patient were reviewed by me and considered in my medical decision making (see chart for details).  Clinical Course    Patient with hypokalemia on her blood work this evening. Given potassium 40 mEq orally and 10 mEq IV piggyback. Patient also with resting tremor likely from early alcohol withdrawal. She was treated with Ativan. Patient's headache treated with Reglan and Benadryl. Will be admitted for observation  Final Clinical Impressions(s) / ED Diagnoses   Final diagnoses:  None   ED ECG REPORT   Date: 09/30/2015  Rate: 87  Rhythm: normal sinus rhythm  QRS Axis: normal  Intervals: QT prolonged  ST/T Wave abnormalities: nonspecific ST/T changes  Conduction Disutrbances:none  Narrative Interpretation:   Old EKG Reviewed: none available  I have personally reviewed the EKG tracing and agree with the computerized printout as noted.  New Prescriptions New Prescriptions   No medications on file    11:05 PM Case d/w dr. Julian Reil and he will evaluate the patient, replace potassium , and decide the patients disposition to inpatient vs home Lorre Nick, MD 09/30/15 2221    Lorre Nick, MD 09/30/15 4076    Lorre Nick, MD 09/30/15 2314

## 2015-09-30 NOTE — ED Notes (Signed)
Pt went to the restroom but she was unable to give enough for a urine sample

## 2015-10-01 ENCOUNTER — Telehealth: Payer: Self-pay | Admitting: Family Medicine

## 2015-10-01 DIAGNOSIS — E876 Hypokalemia: Secondary | ICD-10-CM | POA: Diagnosis not present

## 2015-10-01 NOTE — ED Notes (Signed)
Pt became agitated at waiting for the hospitalist to come see her.  Stated she did not want to wait until 0300 for a blood draw.  Pt stated she wanted to leave against medical advise.  Pt advised to wait and see hospitalist.  Pt declined and still wished to leave.  Pt's IV taken out, risks explained, and pt discharged.

## 2015-10-01 NOTE — Telephone Encounter (Signed)
Pt wanted phone number off back of her old [employer] consultant card

## 2015-10-07 ENCOUNTER — Telehealth: Payer: Self-pay

## 2015-10-07 NOTE — Telephone Encounter (Signed)
Pt called today requesting an urgent new pt appt with Dr. Servando SnareWohl. I asked her what the appt was for and she stated she was having severe abdominal pain. I advised her we usually like to have a referral from her pcp as if she is a new pt we will need to get her information and call her with the appt. I checked our referrals and saw a referral was sent on 06/27/15 and also saw pt had 2 calls made with detailed messages left for a return call. Pt was eventually scheduled on 07/29/15. Pt called and cancelled that appt.  I advised pt today I could work her in on 10/21/15 and she asked me what she was going to do about her severe pain. I advised her if she was in that much pain, she really should go to the ER and be evaluated. Pt decided that was best and will call back if she decides to schedule.

## 2015-10-13 ENCOUNTER — Other Ambulatory Visit: Payer: Self-pay | Admitting: Internal Medicine

## 2015-10-14 ENCOUNTER — Telehealth: Payer: Self-pay | Admitting: Emergency Medicine

## 2015-10-14 ENCOUNTER — Ambulatory Visit (INDEPENDENT_AMBULATORY_CARE_PROVIDER_SITE_OTHER): Payer: Managed Care, Other (non HMO) | Admitting: Physician Assistant

## 2015-10-14 ENCOUNTER — Encounter (HOSPITAL_COMMUNITY): Payer: Self-pay

## 2015-10-14 ENCOUNTER — Emergency Department (HOSPITAL_COMMUNITY)
Admission: EM | Admit: 2015-10-14 | Discharge: 2015-10-14 | Disposition: A | Discharge status: Home / Self Care | Payer: Managed Care, Other (non HMO) | Attending: Dermatology | Admitting: Dermatology

## 2015-10-14 ENCOUNTER — Ambulatory Visit: Payer: Managed Care, Other (non HMO)

## 2015-10-14 VITALS — BP 140/90 | HR 107 | Temp 98.2°F | Resp 20

## 2015-10-14 DIAGNOSIS — R110 Nausea: Secondary | ICD-10-CM | POA: Diagnosis not present

## 2015-10-14 DIAGNOSIS — F17210 Nicotine dependence, cigarettes, uncomplicated: Secondary | ICD-10-CM | POA: Diagnosis not present

## 2015-10-14 DIAGNOSIS — R1084 Generalized abdominal pain: Secondary | ICD-10-CM

## 2015-10-14 DIAGNOSIS — F909 Attention-deficit hyperactivity disorder, unspecified type: Secondary | ICD-10-CM | POA: Insufficient documentation

## 2015-10-14 DIAGNOSIS — Z79899 Other long term (current) drug therapy: Secondary | ICD-10-CM | POA: Insufficient documentation

## 2015-10-14 DIAGNOSIS — R531 Weakness: Secondary | ICD-10-CM | POA: Diagnosis present

## 2015-10-14 DIAGNOSIS — Z5321 Procedure and treatment not carried out due to patient leaving prior to being seen by health care provider: Secondary | ICD-10-CM | POA: Insufficient documentation

## 2015-10-14 LAB — CBC
HCT: 35.6 % — ABNORMAL LOW (ref 36.0–46.0)
Hemoglobin: 12.6 g/dL (ref 12.0–15.0)
MCH: 32.5 pg (ref 26.0–34.0)
MCHC: 35.4 g/dL (ref 30.0–36.0)
MCV: 91.8 fL (ref 78.0–100.0)
PLATELETS: 173 10*3/uL (ref 150–400)
RBC: 3.88 MIL/uL (ref 3.87–5.11)
RDW: 16.1 % — ABNORMAL HIGH (ref 11.5–15.5)
WBC: 11.2 10*3/uL — ABNORMAL HIGH (ref 4.0–10.5)

## 2015-10-14 LAB — COMPREHENSIVE METABOLIC PANEL
ALK PHOS: 224 U/L — AB (ref 38–126)
ALT: 28 U/L (ref 14–54)
AST: 74 U/L — ABNORMAL HIGH (ref 15–41)
Albumin: 3 g/dL — ABNORMAL LOW (ref 3.6–5.1)
Albumin: 3 g/dL — ABNORMAL LOW (ref 3.5–5.0)
Anion gap: 18 — ABNORMAL HIGH (ref 5–15)
BILIRUBIN TOTAL: 2.7 mg/dL — AB (ref 0.3–1.2)
BUN: <5 mg/dL — ABNORMAL LOW (ref 6–20)
CALCIUM: 7.9 mg/dL — AB (ref 8.9–10.3)
CO2: 31 mmol/L (ref 20–31)
CO2: 32 mmol/L (ref 22–32)
CREATININE: 0.48 mg/dL (ref 0.44–1.00)
Calcium: 7.8 mg/dL — ABNORMAL LOW (ref 8.6–10.4)
Chloride: 82 mmol/L — ABNORMAL LOW (ref 98–110)
Chloride: 81 mmol/L — ABNORMAL LOW (ref 101–111)
Creat: 0.45 mg/dL — ABNORMAL LOW (ref 0.50–1.05)
GFR calc non Af Amer: >60 mL/min (ref >60)
GLUCOSE: 88 mg/dL (ref 65–99)
Potassium: <2 mmol/L — CL (ref 3.5–5.1)
SODIUM: 131 mmol/L — AB (ref 135–145)
Sodium: 134 mmol/L — ABNORMAL LOW (ref 135–146)
Total Protein: 6.9 g/dL (ref 6.5–8.1)

## 2015-10-14 LAB — POCT CBC
Granulocyte percent: 86.4 %G — AB (ref 37–80)
HCT, POC: 36.4 % — AB (ref 37.7–47.9)
Hemoglobin: 12.9 g/dL (ref 12.2–16.2)
Lymph, poc: 1 (ref 0.6–3.4)
MCH, POC: 34.1 pg — AB (ref 27–31.2)
MCHC: 35.5 g/dL — AB (ref 31.8–35.4)
MCV: 96 fL (ref 80–97)
MID (cbc): 0.2 (ref 0–0.9)
MPV: 7.8 fL (ref 0–99.8)
POC Granulocyte: 7.9 — AB (ref 2–6.9)
POC LYMPH PERCENT: 11 %L (ref 10–50)
POC MID %: 2.6 % (ref 0–12)
Platelet Count, POC: 157 10*3/uL (ref 142–424)
RBC: 3.79 M/uL — AB (ref 4.04–5.48)
RDW, POC: 16.1 %
WBC: 9.1 10*3/uL (ref 4.6–10.2)

## 2015-10-14 LAB — COMPREHENSIVE METABOLIC PANEL WITH GFR
ALT: 23 U/L (ref 6–29)
AST: 67 U/L — ABNORMAL HIGH (ref 10–35)
Alkaline Phosphatase: 216 U/L — ABNORMAL HIGH (ref 33–130)
BUN: 5 mg/dL — ABNORMAL LOW (ref 7–25)
Glucose, Bld: 78 mg/dL (ref 65–99)
Potassium: 2.2 mmol/L — CL (ref 3.5–5.3)
Total Bilirubin: 2 mg/dL — ABNORMAL HIGH (ref 0.2–1.2)
Total Protein: 6.4 g/dL (ref 6.1–8.1)

## 2015-10-14 LAB — LIPASE, BLOOD: Lipase: 24 U/L (ref 11–51)

## 2015-10-14 MED ORDER — ONDANSETRON HCL 4 MG/2ML IJ SOLN
2.0000 mg | Freq: Once | INTRAMUSCULAR | Status: DC
Start: 2015-10-14 — End: 2015-10-14

## 2015-10-14 MED ORDER — POTASSIUM CHLORIDE 10 MEQ/100ML IV SOLN: 10.0000 meq | Freq: Once | INTRAVENOUS | Status: DC

## 2015-10-14 MED ORDER — PROMETHAZINE HCL 25 MG/ML IJ SOLN
25.0000 mg | Freq: Once | INTRAMUSCULAR | Status: AC
  Administered 2015-10-14: 25 mg via INTRAMUSCULAR

## 2015-10-14 MED ORDER — ONDANSETRON 4 MG PO TBDP
4.0000 mg | ORAL_TABLET | Freq: Once | ORAL | Status: AC | PRN
  Administered 2015-10-14: 4 mg via ORAL
  Filled 2015-10-14: qty 1

## 2015-10-14 MED ORDER — ONDANSETRON HCL 4 MG/2ML IJ SOLN: 2.0000 mg | Freq: Once | INTRAMUSCULAR | Status: DC

## 2015-10-14 NOTE — Telephone Encounter (Signed)
Patient has been dismissed.

## 2015-10-14 NOTE — Progress Notes (Signed)
Anne Black  MRN: [id number] DOB: 1961/01/27  PCP: No primary care provider on file.  Subjective:  Pt is a 55 year old female, history of alcoholic cirrhosis, Multiple sclerosis, bipolar disorder, presenting to clinic for stomach pain x 2 days. Gradual onset for one week, worse yesterday.  +Nausea. No vomiting. No appetite for one week. Some soup, few bites of sandwich.  Pain is throughout abdomen, cannot localize the pain. Does not radiate.  Last BM this morning. Took a stool softener. Cannot recall last BM prior to this morning. Does not notice what bowel movements look like.   Has not taken anything for her belly pain.  Was seen in E.D. Last month for low potassium.   History of alcohol abuse. Last drink 2 days ago. Drinks nightly. Cannot remember how much she drinks. Vodka is drink of choice.  She is afraid today she is feeling poorly due to alcohol withdrawal.  [facility] a few months ago. Felt they couldn't help her due to her medical history of MS and cirrhosis.     History of multiple sclerosis. Treatment at [facility], would like to receive her steroids here. [facility] is too far for her to drive.   Review of Systems  Constitutional: Positive for appetite change and fatigue. Negative for chills and fever.  Respiratory: Negative.   Cardiovascular: Negative.   Gastrointestinal: Positive for abdominal distention, abdominal pain, constipation and nausea. Negative for blood in stool, diarrhea and vomiting.  Genitourinary: Negative for difficulty urinating, dysuria, flank pain, frequency and hematuria.  Neurological: Positive for dizziness, tremors, weakness, light-headedness and headaches.    Patient Active Problem List   Diagnosis Date Noted  . Alcoholic cirrhosis of liver with ascites (HCC) 08/22/2015  . Bipolar disorder, current episode mixed, moderate (HCC) 08/01/2015  . Alcohol use disorder, severe, dependence (HCC) 07/31/2015  . Alcoholic cirrhosis (HCC) 05/31/2015  . Alcoholic  hepatitis with ascites 05/31/2015  . Macrocytic anemia- due to alcohol abuse with normal B12 & folate levels 05/31/2015  . Severe protein-calorie malnutrition (HCC) 05/31/2015  . Hypokalemia 05/26/2015  . Encephalopathy, hepatic (HCC) 05/26/2015  . Anxiety 05/18/2015  . Sepsis (HCC) 05/18/2015  . UTI (lower urinary tract infection) 05/18/2015  . Stimulant abuse 12/27/2014  . Nicotine abuse 12/27/2014  . Hyperprolactinemia (HCC) 11/15/2014  . Dyslipidemia   . Noncompliance with therapeutic plan 04/04/2013  . Unspecified hereditary and idiopathic peripheral neuropathy 05/01/2012  . GERD (gastroesophageal reflux disease) 05/01/2012  . Osteoarthrosis, unspecified whether generalized or localized, involving lower leg 05/01/2012  . MS (multiple sclerosis) (HCC) 09/14/2011    Current Outpatient Prescriptions on File Prior to Visit  Medication Sig Dispense Refill  . albuterol (PROVENTIL HFA;VENTOLIN HFA) 108 (90 Base) MCG/ACT inhaler Inhale 2 puffs into the lungs every 6 (six) hours as needed for wheezing or shortness of breath.    Marland Kitchen amantadine (SYMMETREL) 100 MG capsule Take 1 capsule (100 mg total) by mouth 2 (two) times daily. 30 capsule 0  . FLUoxetine (PROZAC) 20 MG capsule Take 20 mg by mouth daily.   0  . Fluticasone-Salmeterol (ADVAIR) 250-50 MCG/DOSE AEPB Inhale 1 puff into the lungs 2 (two) times daily.    . folic acid (FOLVITE) 1 MG tablet Take 1 tablet (1 mg total) by mouth daily. 30 tablet 1  . furosemide (LASIX) 40 MG tablet Take 1 tablet (40 mg total) by mouth daily. 30 tablet 1  . gabapentin (NEURONTIN) 100 MG capsule Take 2 capsules (200 mg total) by mouth 3 (three) times daily. 90  capsule 0  . lactulose (CHRONULAC) 10 GM/15ML solution Take 30 mLs (20 g total) by mouth 2 (two) times daily. 240 mL 0  . LORazepam (ATIVAN) 0.5 MG tablet Take 0.5 tablets (0.25 mg total) by mouth every 8 (eight) hours as needed for anxiety. 12 tablet 0  . Multiple Vitamin (MULTIVITAMIN WITH  MINERALS) TABS tablet Take 1 tablet by mouth daily. 30 tablet 1  . nicotine (NICODERM CQ - DOSED IN MG/24 HOURS) 21 mg/24hr patch Place 1 patch (21 mg total) onto the skin daily. 28 patch 0  . oxybutynin (DITROPAN-XL) 5 MG 24 hr tablet Take 5 mg by mouth at bedtime.     . pantoprazole (PROTONIX) 40 MG tablet Take 40 mg by mouth daily.    . rifaximin (XIFAXAN) 550 MG TABS tablet Take 1 tablet (550 mg total) by mouth 2 (two) times daily. 60 tablet 0  . risperiDONE (RISPERDAL) 0.5 MG tablet Take 1 tablet (0.5 mg total) by mouth at bedtime. 30 tablet 0  . spironolactone (ALDACTONE) 100 MG tablet Take 1 tablet (100 mg total) by mouth daily. 30 tablet 1  . thiamine 100 MG tablet Take 1 tablet (100 mg total) by mouth daily. 30 tablet 1  . topiramate (TOPAMAX) 100 MG tablet Take 1 tablet (100 mg total) by mouth daily. 30 tablet 0   No current facility-administered medications on file prior to visit.     No Known Allergies  Objective:  BP 140/90   Pulse (!) 107   Temp 98.2 F (36.8 C) (Oral)   Resp 20   SpO2 94%   Physical Exam  Constitutional: She is oriented to person, place, and time. She appears dehydrated. She appears not jaundiced. She appears unhealthy. She appears distressed.  HENT:  Mouth/Throat: Mucous membranes are dry.  Cardiovascular: Regular rhythm, normal heart sounds and intact distal pulses.  Tachycardia present.   Pulmonary/Chest: Effort normal.  Abdominal: She exhibits distension. She exhibits no pulsatile liver, no abdominal bruit and no pulsatile midline mass. Bowel sounds are hypoactive. Bowel sounds are not tinkling. There is hepatomegaly. There is generalized tenderness.  Tempanic LUQ and LLQ. TTP general. Liver is enlarged, TTP.  Bowel sounds normal right abdomen. Hypoactive LUQ and LLQ.   Neurological: She is alert and oriented to person, place, and time. GCS score is 15.  Skin: Skin is warm. Bruising (Multiple bruses b/l forearms different stages of healing. ) noted.  She is diaphoretic.      Psychiatric: Mood, memory, affect and judgment normal.  Vitals reviewed.  Results for orders placed or performed in visit on 10/14/15  Comprehensive metabolic panel  Result Value Ref Range   Sodium 134 (L) 135 - 146 mmol/L   Potassium 2.2 (LL) 3.5 - 5.3 mmol/L   Chloride 82 (L) 98 - 110 mmol/L   CO2 31 20 - 31 mmol/L   Glucose, Bld 78 65 - 99 mg/dL   BUN 5 (L) 7 - 25 mg/dL   Creat 0.980.45 (L) 1.190.50 - 1.05 mg/dL   Total Bilirubin 2.0 (H) 0.2 - 1.2 mg/dL   Alkaline Phosphatase 216 (H) 33 - 130 U/L   AST 67 (H) 10 - 35 U/L   ALT 23 6 - 29 U/L   Total Protein 6.4 6.1 - 8.1 g/dL   Albumin 3.0 (L) 3.6 - 5.1 g/dL   Calcium 7.8 (L) 8.6 - 10.4 mg/dL  POCT CBC  Result Value Ref Range   WBC 9.1 4.6 - 10.2 K/uL   Lymph, poc 1.0  0.6 - 3.4   POC LYMPH PERCENT 11.0 10 - 50 %L   MID (cbc) 0.2 0 - 0.9   POC MID % 2.6 0 - 12 %M   POC Granulocyte 7.9 (A) 2 - 6.9   Granulocyte percent 86.4 (A) 37 - 80 %G   RBC 3.79 (A) 4.04 - 5.48 M/uL   Hemoglobin 12.9 12.2 - 16.2 g/dL   HCT, POC 16.1 (A) 09.6 - 47.9 %   MCV 96.0 80 - 97 fL   MCH, POC 34.1 (A) 27 - 31.2 pg   MCHC 35.5 (A) 31.8 - 35.4 g/dL   RDW, POC 04.5 %   Platelet Count, POC 157 142 - 424 K/uL   MPV 7.8 0 - 99.8 fL    Assessment and Plan :  1. Generalized abdominal pain - POCT CBC 2. Nausea without vomiting - promethazine (PHENERGAN) injection 25 mg; Inject 1 mL (25 mg total) into the muscle once.  - Discussed with patient her need to got to the emergency department at this time for further evaluation, testing and treatment. She understands. States she has a driver at the clinic with her and would like to go by personal vehicle.   Marco Collie, PA-C  [facility] 10/14/2015 8:36 AM

## 2015-10-14 NOTE — ED Triage Notes (Signed)
Another pt in the emergency room reported that she overheard pt calling a cab.

## 2015-10-14 NOTE — ED Triage Notes (Signed)
Attempted to take pt back to a room but pt is no longer in the ER. Westly PamLacy, EMT went outside of the ER but pt is nowhere to be found. Pt has family who is being seen in another area of the ER at this time and they are attempting to get in contact with pt. PA ChadWest made aware that pt is not longer in the ER.

## 2015-10-14 NOTE — Telephone Encounter (Signed)
[facility] called in critical K+ 2.2 Pt aware and instructed to go straight to ER for further evaluation and treatment. Pt was Fast Tracked today and told to go directly to ER but non complaint. Pt states, she will go tomorrow due to transportation. Reiterated, she needs to go today for care.  Alphonzo Lemmings and Sarah aware

## 2015-10-14 NOTE — Patient Instructions (Addendum)
Please go directly to the E.D.      IF you received an x-ray today, you will receive an invoice from [facility]. Please contact [facility] at [phone] with questions or concerns regarding your invoice.   IF you received labwork today, you will receive an invoice from [employer]. Please contact [facility] at [phone] with questions or concerns regarding your invoice.   Our billing staff will not be able to assist you with questions regarding bills from these companies.  You will be contacted with the lab results as soon as they are available. The fastest way to get your results is to activate your My Chart account. Instructions are located on the last page of this paperwork. If you have not heard from [place] regarding the results in 2 weeks, please contact this office.

## 2015-10-14 NOTE — ED Triage Notes (Signed)
Pt presents with c/o weakness and nausea that started yesterday. Pt reports she was seen by her physician today and instructed to come to the ER. Pt reports she is unable to eat. Pt reports that she had chest pain earlier today but denies any at this time. Pt reports she believes she is dehydrated.

## 2015-10-14 NOTE — ED Notes (Signed)
PA Irving Burton made aware of pt's potassium level of <2.

## 2015-10-15 ENCOUNTER — Emergency Department (HOSPITAL_COMMUNITY): Payer: Managed Care, Other (non HMO)

## 2015-10-15 ENCOUNTER — Encounter (HOSPITAL_COMMUNITY): Payer: Self-pay

## 2015-10-15 ENCOUNTER — Inpatient Hospital Stay (HOSPITAL_COMMUNITY)
Admission: EM | Admit: 2015-10-15 | Discharge: 2015-10-24 | DRG: 433 | Disposition: A | Discharge status: Home Health | Payer: Managed Care, Other (non HMO) | Attending: Family Medicine | Admitting: Family Medicine

## 2015-10-15 DIAGNOSIS — E871 Hypo-osmolality and hyponatremia: Secondary | ICD-10-CM | POA: Diagnosis present

## 2015-10-15 DIAGNOSIS — F17210 Nicotine dependence, cigarettes, uncomplicated: Secondary | ICD-10-CM | POA: Diagnosis present

## 2015-10-15 DIAGNOSIS — K766 Portal hypertension: Secondary | ICD-10-CM | POA: Diagnosis present

## 2015-10-15 DIAGNOSIS — I472 Ventricular tachycardia: Secondary | ICD-10-CM | POA: Diagnosis not present

## 2015-10-15 DIAGNOSIS — K7031 Alcoholic cirrhosis of liver with ascites: Secondary | ICD-10-CM | POA: Diagnosis present

## 2015-10-15 DIAGNOSIS — Z6830 Body mass index (BMI) 30.0-30.9, adult: Secondary | ICD-10-CM

## 2015-10-15 DIAGNOSIS — J9601 Acute respiratory failure with hypoxia: Secondary | ICD-10-CM | POA: Diagnosis not present

## 2015-10-15 DIAGNOSIS — E872 Acidosis: Secondary | ICD-10-CM | POA: Diagnosis present

## 2015-10-15 DIAGNOSIS — E860 Dehydration: Secondary | ICD-10-CM | POA: Diagnosis present

## 2015-10-15 DIAGNOSIS — K219 Gastro-esophageal reflux disease without esophagitis: Secondary | ICD-10-CM | POA: Diagnosis present

## 2015-10-15 DIAGNOSIS — E878 Other disorders of electrolyte and fluid balance, not elsewhere classified: Secondary | ICD-10-CM | POA: Diagnosis present

## 2015-10-15 DIAGNOSIS — K7011 Alcoholic hepatitis with ascites: Secondary | ICD-10-CM | POA: Diagnosis present

## 2015-10-15 DIAGNOSIS — R188 Other ascites: Secondary | ICD-10-CM

## 2015-10-15 DIAGNOSIS — J9600 Acute respiratory failure, unspecified whether with hypoxia or hypercapnia: Secondary | ICD-10-CM | POA: Diagnosis present

## 2015-10-15 DIAGNOSIS — K5900 Constipation, unspecified: Secondary | ICD-10-CM | POA: Diagnosis present

## 2015-10-15 DIAGNOSIS — E663 Overweight: Secondary | ICD-10-CM | POA: Diagnosis present

## 2015-10-15 DIAGNOSIS — F419 Anxiety disorder, unspecified: Secondary | ICD-10-CM | POA: Diagnosis present

## 2015-10-15 DIAGNOSIS — Z79899 Other long term (current) drug therapy: Secondary | ICD-10-CM | POA: Diagnosis not present

## 2015-10-15 DIAGNOSIS — R0600 Dyspnea, unspecified: Secondary | ICD-10-CM

## 2015-10-15 DIAGNOSIS — F3170 Bipolar disorder, currently in remission, most recent episode unspecified: Secondary | ICD-10-CM | POA: Diagnosis not present

## 2015-10-15 DIAGNOSIS — Z7951 Long term (current) use of inhaled steroids: Secondary | ICD-10-CM

## 2015-10-15 DIAGNOSIS — K7460 Unspecified cirrhosis of liver: Secondary | ICD-10-CM

## 2015-10-15 DIAGNOSIS — N390 Urinary tract infection, site not specified: Secondary | ICD-10-CM | POA: Diagnosis present

## 2015-10-15 DIAGNOSIS — R443 Hallucinations, unspecified: Secondary | ICD-10-CM | POA: Diagnosis not present

## 2015-10-15 DIAGNOSIS — E876 Hypokalemia: Secondary | ICD-10-CM | POA: Diagnosis present

## 2015-10-15 DIAGNOSIS — F10239 Alcohol dependence with withdrawal, unspecified: Secondary | ICD-10-CM | POA: Diagnosis not present

## 2015-10-15 DIAGNOSIS — B961 Klebsiella pneumoniae [K. pneumoniae] as the cause of diseases classified elsewhere: Secondary | ICD-10-CM | POA: Diagnosis present

## 2015-10-15 DIAGNOSIS — B379 Candidiasis, unspecified: Secondary | ICD-10-CM | POA: Diagnosis not present

## 2015-10-15 DIAGNOSIS — E785 Hyperlipidemia, unspecified: Secondary | ICD-10-CM | POA: Diagnosis present

## 2015-10-15 DIAGNOSIS — G35 Multiple sclerosis: Secondary | ICD-10-CM | POA: Diagnosis present

## 2015-10-15 DIAGNOSIS — F1020 Alcohol dependence, uncomplicated: Secondary | ICD-10-CM | POA: Diagnosis present

## 2015-10-15 DIAGNOSIS — R0602 Shortness of breath: Secondary | ICD-10-CM

## 2015-10-15 DIAGNOSIS — M810 Age-related osteoporosis without current pathological fracture: Secondary | ICD-10-CM | POA: Insufficient documentation

## 2015-10-15 DIAGNOSIS — F319 Bipolar disorder, unspecified: Secondary | ICD-10-CM | POA: Diagnosis present

## 2015-10-15 DIAGNOSIS — I4581 Long QT syndrome: Secondary | ICD-10-CM | POA: Diagnosis present

## 2015-10-15 DIAGNOSIS — K7030 Alcoholic cirrhosis of liver without ascites: Secondary | ICD-10-CM | POA: Diagnosis present

## 2015-10-15 DIAGNOSIS — R109 Unspecified abdominal pain: Secondary | ICD-10-CM | POA: Diagnosis present

## 2015-10-15 HISTORY — DX: Hypokalemia: E87.6

## 2015-10-15 LAB — CBC WITH DIFFERENTIAL/PLATELET
BASOS ABS: 0 10*3/uL (ref 0.0–0.1)
BASOS PCT: 0 %
EOS ABS: 0 10*3/uL (ref 0.0–0.7)
EOS PCT: 0 %
HCT: 38.4 % (ref 36.0–46.0)
Hemoglobin: 13.2 g/dL (ref 12.0–15.0)
Lymphocytes Relative: 16 %
Lymphs Abs: 1.4 10*3/uL (ref 0.7–4.0)
MCH: 32.4 pg (ref 26.0–34.0)
MCHC: 34.4 g/dL (ref 30.0–36.0)
MCV: 94.3 fL (ref 78.0–100.0)
MONO ABS: 0.7 10*3/uL (ref 0.1–1.0)
Monocytes Relative: 8 %
Neutro Abs: 6.8 10*3/uL (ref 1.7–7.7)
Neutrophils Relative %: 76 %
PLATELETS: 170 10*3/uL (ref 150–400)
RBC: 4.07 MIL/uL (ref 3.87–5.11)
RDW: 16 % — AB (ref 11.5–15.5)
WBC: 9 10*3/uL (ref 4.0–10.5)

## 2015-10-15 LAB — URINE MICROSCOPIC-ADD ON: RBC / HPF: NONE SEEN RBC/hpf (ref 0–5)

## 2015-10-15 LAB — URINALYSIS, ROUTINE W REFLEX MICROSCOPIC
GLUCOSE, UA: NEGATIVE mg/dL
Hgb urine dipstick: NEGATIVE
Ketones, ur: >80 mg/dL — AB
Nitrite: NEGATIVE
PROTEIN: NEGATIVE mg/dL
SPECIFIC GRAVITY, URINE: 1.021 (ref 1.005–1.030)
pH: 6 (ref 5.0–8.0)

## 2015-10-15 LAB — COMPREHENSIVE METABOLIC PANEL
ALBUMIN: 3 g/dL — AB (ref 3.5–5.0)
ALT: 30 U/L (ref 14–54)
AST: 80 U/L — AB (ref 15–41)
Alkaline Phosphatase: 216 U/L — ABNORMAL HIGH (ref 38–126)
Anion gap: 19 — ABNORMAL HIGH (ref 5–15)
CHLORIDE: 81 mmol/L — AB (ref 101–111)
CO2: 33 mmol/L — AB (ref 22–32)
Calcium: 8.2 mg/dL — ABNORMAL LOW (ref 8.9–10.3)
Creatinine, Ser: 0.61 mg/dL (ref 0.44–1.00)
GFR calc Af Amer: >60 mL/min (ref >60)
Glucose, Bld: 84 mg/dL (ref 65–99)
SODIUM: 133 mmol/L — AB (ref 135–145)
Total Bilirubin: 2.6 mg/dL — ABNORMAL HIGH (ref 0.3–1.2)
Total Protein: 7 g/dL (ref 6.5–8.1)

## 2015-10-15 LAB — PROTIME-INR
INR: 1.15
PROTHROMBIN TIME: 14.7 s (ref 11.4–15.2)

## 2015-10-15 LAB — LIPASE, BLOOD: LIPASE: 31 U/L (ref 11–51)

## 2015-10-15 LAB — TROPONIN I

## 2015-10-15 LAB — MAGNESIUM: Magnesium: 1.5 mg/dL — ABNORMAL LOW (ref 1.7–2.4)

## 2015-10-15 LAB — MRSA PCR SCREENING: MRSA BY PCR: NEGATIVE

## 2015-10-15 LAB — AMMONIA: Ammonia: 61 umol/L — ABNORMAL HIGH (ref 9–35)

## 2015-10-15 LAB — ETHANOL: ALCOHOL ETHYL (B): 183 mg/dL — AB (ref <5)

## 2015-10-15 MED ORDER — KETOROLAC TROMETHAMINE 30 MG/ML IJ SOLN
30.0000 mg | Freq: Once | INTRAMUSCULAR | Status: AC
  Administered 2015-10-15: 30 mg via INTRAVENOUS
  Filled 2015-10-15: qty 1

## 2015-10-15 MED ORDER — TOPIRAMATE 100 MG PO TABS
100.0000 mg | ORAL_TABLET | Freq: Every day | ORAL | Status: DC
  Administered 2015-10-16 – 2015-10-24 (×10): 100 mg via ORAL
  Filled 2015-10-15 (×10): qty 1

## 2015-10-15 MED ORDER — POLYETHYLENE GLYCOL 3350 17 G PO PACK: 17.0000 g | PACK | Freq: Every day | ORAL | Status: DC | PRN

## 2015-10-15 MED ORDER — ENOXAPARIN SODIUM 40 MG/0.4ML ~~LOC~~ SOLN
40.0000 mg | SUBCUTANEOUS | Status: DC
  Administered 2015-10-16 – 2015-10-24 (×9): 40 mg via SUBCUTANEOUS
  Filled 2015-10-15 (×9): qty 0.4

## 2015-10-15 MED ORDER — VITAMIN B-1 100 MG PO TABS
100.0000 mg | ORAL_TABLET | Freq: Every day | ORAL | Status: DC
  Administered 2015-10-16 – 2015-10-24 (×9): 100 mg via ORAL
  Filled 2015-10-15 (×9): qty 1

## 2015-10-15 MED ORDER — MOMETASONE FURO-FORMOTEROL FUM 200-5 MCG/ACT IN AERO
2.0000 | INHALATION_SPRAY | Freq: Two times a day (BID) | RESPIRATORY_TRACT | Status: DC
  Administered 2015-10-16 – 2015-10-23 (×13): 2 via RESPIRATORY_TRACT
  Filled 2015-10-15 (×4): qty 8.8

## 2015-10-15 MED ORDER — SODIUM CHLORIDE 0.9% FLUSH
3.0000 mL | Freq: Two times a day (BID) | INTRAVENOUS | Status: DC
  Administered 2015-10-15 – 2015-10-17 (×4): 3 mL via INTRAVENOUS
  Administered 2015-10-17: 10 mL via INTRAVENOUS
  Administered 2015-10-17 – 2015-10-24 (×13): 3 mL via INTRAVENOUS

## 2015-10-15 MED ORDER — POTASSIUM CHLORIDE 10 MEQ/100ML IV SOLN
10.0000 meq | INTRAVENOUS | Status: AC
  Administered 2015-10-15 (×2): 10 meq via INTRAVENOUS
  Filled 2015-10-15 (×2): qty 100

## 2015-10-15 MED ORDER — LACTULOSE 10 GM/15ML PO SOLN
20.0000 g | Freq: Two times a day (BID) | ORAL | Status: DC
  Administered 2015-10-17 – 2015-10-21 (×7): 20 g via ORAL
  Filled 2015-10-15 (×18): qty 30

## 2015-10-15 MED ORDER — LORAZEPAM 2 MG/ML IJ SOLN
2.0000 mg | INTRAMUSCULAR | Status: DC | PRN
  Administered 2015-10-16 (×3): 2 mg via INTRAVENOUS
  Filled 2015-10-15 (×4): qty 1

## 2015-10-15 MED ORDER — MELATONIN 3 MG PO TABS
3.0000 mg | ORAL_TABLET | Freq: Every evening | ORAL | Status: DC | PRN
  Administered 2015-10-18 – 2015-10-23 (×2): 3 mg via ORAL
  Filled 2015-10-15 (×4): qty 1

## 2015-10-15 MED ORDER — RISPERIDONE 0.5 MG PO TABS
0.5000 mg | ORAL_TABLET | Freq: Every day | ORAL | Status: DC
  Administered 2015-10-15 – 2015-10-23 (×9): 0.5 mg via ORAL
  Filled 2015-10-15 (×12): qty 1

## 2015-10-15 MED ORDER — NON FORMULARY: 1.0000 | Freq: Every evening | Status: DC | PRN

## 2015-10-15 MED ORDER — PROMETHAZINE HCL 25 MG/ML IJ SOLN
12.5000 mg | Freq: Four times a day (QID) | INTRAMUSCULAR | Status: DC | PRN
Start: 2015-10-15 — End: 2015-10-16
  Administered 2015-10-15: 12.5 mg via INTRAVENOUS
  Filled 2015-10-15 (×2): qty 1

## 2015-10-15 MED ORDER — GABAPENTIN 100 MG PO CAPS
200.0000 mg | ORAL_CAPSULE | Freq: Three times a day (TID) | ORAL | Status: DC
  Administered 2015-10-15 – 2015-10-24 (×26): 200 mg via ORAL
  Filled 2015-10-15 (×27): qty 2

## 2015-10-15 MED ORDER — FOLIC ACID 1 MG PO TABS
1.0000 mg | ORAL_TABLET | Freq: Every day | ORAL | Status: DC
  Administered 2015-10-16 – 2015-10-24 (×9): 1 mg via ORAL
  Filled 2015-10-15 (×9): qty 1

## 2015-10-15 MED ORDER — ORAL CARE MOUTH RINSE
15.0000 mL | Freq: Two times a day (BID) | OROMUCOSAL | Status: DC
  Administered 2015-10-17 – 2015-10-24 (×11): 15 mL via OROMUCOSAL

## 2015-10-15 MED ORDER — SODIUM CHLORIDE 0.9 % IV SOLN
INTRAVENOUS | Status: DC
  Administered 2015-10-15: 15:00:00 via INTRAVENOUS
  Administered 2015-10-16: 1000 mL via INTRAVENOUS
  Administered 2015-10-16: 09:00:00 via INTRAVENOUS
  Administered 2015-10-17: 1000 mL via INTRAVENOUS

## 2015-10-15 MED ORDER — AMANTADINE HCL 100 MG PO CAPS
100.0000 mg | ORAL_CAPSULE | Freq: Two times a day (BID) | ORAL | Status: DC
  Administered 2015-10-16 – 2015-10-24 (×17): 100 mg via ORAL
  Filled 2015-10-15 (×20): qty 1

## 2015-10-15 MED ORDER — ADULT MULTIVITAMIN W/MINERALS CH
1.0000 | ORAL_TABLET | Freq: Every day | ORAL | Status: DC
  Administered 2015-10-16 – 2015-10-24 (×9): 1 via ORAL
  Filled 2015-10-15 (×9): qty 1

## 2015-10-15 MED ORDER — DOCUSATE SODIUM 100 MG PO CAPS: 100.0000 mg | ORAL_CAPSULE | Freq: Every day | ORAL | Status: DC | PRN

## 2015-10-15 MED ORDER — SODIUM CHLORIDE 0.9 % IV BOLUS (SEPSIS)
500.0000 mL | Freq: Once | INTRAVENOUS | Status: AC
  Administered 2015-10-15: 500 mL via INTRAVENOUS

## 2015-10-15 MED ORDER — RIFAXIMIN 550 MG PO TABS
550.0000 mg | ORAL_TABLET | Freq: Two times a day (BID) | ORAL | Status: DC
  Administered 2015-10-15 – 2015-10-24 (×17): 550 mg via ORAL
  Filled 2015-10-15 (×18): qty 1

## 2015-10-15 MED ORDER — SPIRONOLACTONE 50 MG PO TABS
100.0000 mg | ORAL_TABLET | Freq: Every day | ORAL | Status: DC
  Administered 2015-10-16 – 2015-10-24 (×10): 100 mg via ORAL
  Filled 2015-10-15 (×10): qty 2

## 2015-10-15 MED ORDER — ONDANSETRON HCL 4 MG/2ML IJ SOLN
4.0000 mg | Freq: Once | INTRAMUSCULAR | Status: AC
  Administered 2015-10-15: 4 mg via INTRAVENOUS
  Filled 2015-10-15: qty 2

## 2015-10-15 MED ORDER — LORAZEPAM 2 MG/ML IJ SOLN
1.0000 mg | Freq: Once | INTRAMUSCULAR | Status: AC
  Administered 2015-10-15: 1 mg via INTRAVENOUS
  Filled 2015-10-15: qty 1

## 2015-10-15 MED ORDER — ALBUTEROL SULFATE (2.5 MG/3ML) 0.083% IN NEBU: 3.0000 mL | INHALATION_SOLUTION | Freq: Four times a day (QID) | RESPIRATORY_TRACT | Status: DC | PRN

## 2015-10-15 MED ORDER — FLUOXETINE HCL 20 MG PO CAPS
20.0000 mg | ORAL_CAPSULE | Freq: Every day | ORAL | Status: DC
  Administered 2015-10-16 – 2015-10-24 (×10): 20 mg via ORAL
  Filled 2015-10-15 (×10): qty 1

## 2015-10-15 MED ORDER — POTASSIUM CHLORIDE CRYS ER 20 MEQ PO TBCR
40.0000 meq | EXTENDED_RELEASE_TABLET | Freq: Once | ORAL | Status: DC
  Filled 2015-10-15: qty 2

## 2015-10-15 MED ORDER — IOPAMIDOL (ISOVUE-300) INJECTION 61%
INTRAVENOUS | Status: AC
  Administered 2015-10-15: 100 mL
  Filled 2015-10-15: qty 100

## 2015-10-15 MED ORDER — POTASSIUM CHLORIDE 10 MEQ/100ML IV SOLN
10.0000 meq | INTRAVENOUS | Status: AC
  Administered 2015-10-15 (×3): 10 meq via INTRAVENOUS
  Filled 2015-10-15 (×3): qty 100

## 2015-10-15 MED ORDER — PANTOPRAZOLE SODIUM 40 MG PO TBEC
40.0000 mg | DELAYED_RELEASE_TABLET | Freq: Every day | ORAL | Status: DC
  Administered 2015-10-16 – 2015-10-24 (×10): 40 mg via ORAL
  Filled 2015-10-15 (×10): qty 1

## 2015-10-15 MED ORDER — MAGNESIUM SULFATE 2 GM/50ML IV SOLN
2.0000 g | Freq: Once | INTRAVENOUS | Status: AC
  Administered 2015-10-15: 2 g via INTRAVENOUS
  Filled 2015-10-15: qty 50

## 2015-10-15 NOTE — ED Notes (Signed)
Placed patient into a gown and on the monitor did ekg shown to er doctor 

## 2015-10-15 NOTE — ED Provider Notes (Signed)
[facility] DEPT Provider Note   CSN: [id number] Arrival date & time: 10/15/15  1018     History   Chief Complaint Chief Complaint  Patient presents with  . Abdominal Pain    HPI ELLEAN FIRMAN is a 55 y.o. female.  JACALYNN BUZZELL is a 55 y.o. female with h/o anxiety, bipolar disorder, arthritis, alcohol cirrhosis with ascites, mirgraine, dyslipidemia, noncompliance with medication, MS, GERD presents to ED with complaint of abdominal pain and distension. Symptoms have worsened over the last few days. She states she was diagnosed with cirrhosis approximately 6 months ago and has had to have fluid withdrawn from her abdomen. She has associated nausea, decreased appetite, constipation, difficulty breathing, chest wall pain, and increased bruising. She also reports dry mucous membranes. She denies fever, vomiting, dysuria, hematuria, or syncope. Patient was seen in the ED yesterday and noted to have low potassium; however, she did not stay due to the wait.       Past Medical History:  Diagnosis Date  . ADHD (attention deficit hyperactivity disorder)   . Anxiety   . Arthritis   . Bipolar disorder ([place])   . Cirrhosis ([place])   . Hypokalemia   . Migraine   . Multiple sclerosis ([place])   . Osteoporosis     Patient Active Problem List   Diagnosis Date Noted  . Acute respiratory failure ([place]) 10/15/2015  . Ascites 10/15/2015  . Osteoporosis   . Cirrhosis ([place])   . Bipolar disorder ([place])   . Multiple sclerosis ([place])   . Alcoholic cirrhosis of liver with ascites ([place]) 08/22/2015  . Bipolar disorder, current episode mixed, moderate ([place]) 08/01/2015  . Alcohol use disorder, severe, dependence ([place]) 07/31/2015  . Alcoholic cirrhosis ([place]) 43/15/4008  . Alcoholic hepatitis with ascites 05/31/2015  . Macrocytic anemia- due to alcohol abuse with normal B12 & folate levels 05/31/2015  . Severe protein-calorie malnutrition ([place]) 05/31/2015  . Hypokalemia 05/26/2015  . Encephalopathy,  hepatic ([place]) 05/26/2015  . Anxiety 05/18/2015  . Sepsis ([place]) 05/18/2015  . UTI (lower urinary tract infection) 05/18/2015  . Stimulant abuse 12/27/2014  . Nicotine abuse 12/27/2014  . Hyperprolactinemia ([place]) 11/15/2014  . Dyslipidemia   . Noncompliance with therapeutic plan 04/04/2013  . Unspecified hereditary and idiopathic peripheral neuropathy 05/01/2012  . GERD (gastroesophageal reflux disease) 05/01/2012  . Osteoarthrosis, unspecified whether generalized or localized, involving lower leg 05/01/2012  . MS (multiple sclerosis) ([place]) 09/14/2011    Past Surgical History:  Procedure Laterality Date  . CESAREAN SECTION  [id number]  . MYRINGOTOMY WITH TUBE PLACEMENT Bilateral   . TONSILLECTOMY      OB History    No data available       Home Medications    Prior to Admission medications   Medication Sig Start Date End Date Taking? Authorizing Provider  albuterol (PROVENTIL HFA;VENTOLIN HFA) 108 (90 Base) MCG/ACT inhaler Inhale 2 puffs into the lungs every 6 (six) hours as needed for wheezing or shortness of breath.    Historical Provider, MD  amantadine (SYMMETREL) 100 MG capsule Take 1 capsule (100 mg total) by mouth 2 (two) times daily. 08/04/15   Derrill Center, NP  FLUoxetine (PROZAC) 20 MG capsule Take 20 mg by mouth daily.     Historical Provider, MD  Fluticasone-Salmeterol (ADVAIR) 250-50 MCG/DOSE AEPB Inhale 1 puff into the lungs 2 (two) times daily.    Historical Provider, MD  folic acid (FOLVITE) 1 MG tablet Take 1 tablet (1 mg total) by mouth daily. 08/30/15  Bonnell Public, MD  furosemide (LASIX) 40 MG tablet Take 1 tablet (40 mg total) by mouth daily. 08/30/15   Bonnell Public, MD  gabapentin (NEURONTIN) 100 MG capsule Take 2 capsules (200 mg total) by mouth 3 (three) times daily. 08/30/15   Bonnell Public, MD  lactulose (CHRONULAC) 10 GM/15ML solution Take 30 mLs (20 g total) by mouth 2 (two) times daily. 08/30/15   Bonnell Public, MD  LORazepam  (ATIVAN) 0.5 MG tablet Take 0.5 tablets (0.25 mg total) by mouth every 8 (eight) hours as needed for anxiety. 08/30/15   Bonnell Public, MD  Multiple Vitamin (MULTIVITAMIN WITH MINERALS) TABS tablet Take 1 tablet by mouth daily. 08/30/15   Bonnell Public, MD  nicotine (NICODERM CQ - DOSED IN MG/24 HOURS) 21 mg/24hr patch Place 1 patch (21 mg total) onto the skin daily. 08/04/15   Derrill Center, NP  oxybutynin (DITROPAN-XL) 5 MG 24 hr tablet Take 5 mg by mouth at bedtime.     Historical Provider, MD  pantoprazole (PROTONIX) 40 MG tablet Take 40 mg by mouth daily.    Historical Provider, MD  rifaximin (XIFAXAN) 550 MG TABS tablet Take 1 tablet (550 mg total) by mouth 2 (two) times daily. 06/05/15   Orson Eva, MD  risperiDONE (RISPERDAL) 0.5 MG tablet Take 1 tablet (0.5 mg total) by mouth at bedtime. 08/04/15   Derrill Center, NP  spironolactone (ALDACTONE) 100 MG tablet Take 1 tablet (100 mg total) by mouth daily. 08/30/15   Bonnell Public, MD  thiamine 100 MG tablet Take 1 tablet (100 mg total) by mouth daily. 08/30/15   Bonnell Public, MD  topiramate (TOPAMAX) 100 MG tablet Take 1 tablet (100 mg total) by mouth daily. 02/21/15   Niel Hummer, NP    Family History Family History  Problem Relation Age of Onset  . Heart disease Mother   . Heart disease Father   . Hypertension Father     Social History Social History  Substance Use Topics  . Smoking status: Current Every Day Smoker    Packs/day: 0.75    Years: 20.00    Types: Cigarettes  . Smokeless tobacco: Never Used     Comment: is cutting back, not in the car, house  . Alcohol use 4.8 oz/week    2 Glasses of wine, 2 Cans of beer, 4 Standard drinks or equivalent per week     Comment: "I'm a social drinker." "I drink a glass of wine nightly."     Allergies   Review of patient's allergies indicates no known allergies.   Review of Systems Review of Systems  Constitutional: Negative for chills, diaphoresis and fever.    HENT: Negative for trouble swallowing.   Eyes: Negative for visual disturbance.  Respiratory: Positive for shortness of breath.   Cardiovascular: Positive for chest pain (chest wall).  Gastrointestinal: Positive for abdominal distention, abdominal pain, constipation and nausea. Negative for blood in stool, diarrhea and vomiting.  Genitourinary: Negative for dysuria and hematuria.  Musculoskeletal: Negative for neck pain.  Skin: Positive for color change. Negative for rash.  Neurological: Negative for syncope.     Physical Exam Updated Vital Signs BP 109/56   Pulse 91   Temp 98.6 F (37 C) (Oral)   Resp 20   SpO2 94%   Physical Exam  Constitutional: She appears ill. She appears distressed.  HENT:  Head: Normocephalic and atraumatic.  Mouth/Throat: Uvula is midline. Mucous membranes are dry. No  trismus in the jaw. No oropharyngeal exudate, posterior oropharyngeal edema, posterior oropharyngeal erythema or tonsillar abscesses.  Eyes: Conjunctivae and EOM are normal. Pupils are equal, round, and reactive to light. Right eye exhibits no discharge. Left eye exhibits no discharge. Scleral icterus ( mild) is present.  Neck: Normal range of motion and phonation normal. Neck supple. No neck rigidity. Normal range of motion present.  Cardiovascular: Normal rate, regular rhythm, normal heart sounds and intact distal pulses.   No murmur heard. Pulmonary/Chest: Effort normal and breath sounds normal. No stridor. No respiratory distress. She has no wheezes. She has no rales. She exhibits tenderness.    Abdominal: Bowel sounds are normal. She exhibits distension and ascites. There is generalized tenderness. There is no rigidity, no rebound, no guarding and no CVA tenderness.  Musculoskeletal: Normal range of motion.  Lymphadenopathy:    She has no cervical adenopathy.  Neurological: She is alert. She is not disoriented. Coordination and gait normal. GCS eye subscore is 4. GCS verbal subscore  is 5. GCS motor subscore is 6.  Skin: Skin is warm and dry. Petechiae noted. She is not diaphoretic.  Psychiatric: Her mood appears anxious. She is slowed.     ED Treatments / Results  Labs (all labs ordered are listed, but only abnormal results are displayed) Labs Reviewed  CBC WITH DIFFERENTIAL/PLATELET - Abnormal; Notable for the following:       Result Value   RDW 16.0 (*)    All other components within normal limits  COMPREHENSIVE METABOLIC PANEL - Abnormal; Notable for the following:    Sodium 133 (*)    Potassium <2.0 (*)    Chloride 81 (*)    CO2 33 (*)    BUN <5 (*)    Calcium 8.2 (*)    Albumin 3.0 (*)    AST 80 (*)    Alkaline Phosphatase 216 (*)    Total Bilirubin 2.6 (*)    Anion gap 19 (*)    All other components within normal limits  URINALYSIS, ROUTINE W REFLEX MICROSCOPIC (NOT AT [facility]) - Abnormal; Notable for the following:    Color, Urine AMBER (*)    APPearance CLOUDY (*)    Bilirubin Urine SMALL (*)    Ketones, ur >80 (*)    Leukocytes, UA SMALL (*)    All other components within normal limits  URINE MICROSCOPIC-ADD ON - Abnormal; Notable for the following:    Squamous Epithelial / LPF 6-30 (*)    Bacteria, UA MANY (*)    All other components within normal limits  AMMONIA - Abnormal; Notable for the following:    Ammonia 61 (*)    All other components within normal limits  LIPASE, BLOOD  PROTIME-INR  TROPONIN I  ETHANOL  MAGNESIUM    EKG  EKG Interpretation  Date/Time:  Wednesday October 15 2015 10:23:39 EDT Ventricular Rate:  94 PR Interval:    QRS Duration: 96 QT Interval:  475 QTC Calculation: 595 R Axis:   79 Text Interpretation:  Sinus rhythm Nonspecific repol abnormality, diffuse leads Prolonged QT interval No significant change since last tracing Confirmed by ZACKOWSKI  MD, SCOTT [phone]) on 10/15/2015 11:58:50 AM       Radiology Dg Chest 2 View  Result Date: 10/15/2015 CLINICAL DATA:  Chest pain, shortness of breath, nausea  EXAM: CHEST  2 VIEW COMPARISON:  08/22/2015 FINDINGS: Lungs are clear.  No pleural effusion or pneumothorax. The heart is normal in size. Visualized osseous structures are within normal limits.  IMPRESSION: Normal chest radiographs. Electronically Signed   By: Julian Hy M.D.   On: 10/15/2015 13:27   Ct Abdomen Pelvis W Contrast  Result Date: 10/15/2015 CLINICAL DATA:  Diffuse abdominal pain, cirrhosis, ascites EXAM: CT ABDOMEN AND PELVIS WITH CONTRAST TECHNIQUE: Multidetector CT imaging of the abdomen and pelvis was performed using the standard protocol following bolus administration of intravenous contrast. CONTRAST:  165m ISOVUE-300 IOPAMIDOL (ISOVUE-300) INJECTION 61% COMPARISON:  05/18/2015 FINDINGS: The lung bases are unremarkable Small hiatal hernia is noted. Again noted significant fatty infiltration of the liver. Nodular contour of the liver consistent with cirrhosis. Moderate perihepatic and perisplenic ascites. Moderate ascites is noted bilateral paracolic gutters. Moderate ascites mid lower mesentery. Enhanced pancreas is unremarkable. Enhanced spleen is unremarkable. The spleen measures 10.4 cm in length. No adrenal gland mass. Enhanced kidneys are symmetrical in size. No hydronephrosis or hydroureter. Delayed renal images shows bilateral renal symmetrical excretion. Bilateral visualized proximal ureter is unremarkable. No calcified calculi are noted within under distended urinary bladder. There is no small bowel obstruction. No pericecal inflammation. Normal appendix is partially visualized. No distal colonic obstruction. The uterus is atrophic. No adnexal masses noted. Moderate pelvic ascites. Some stool and gas noted within rectum. There is no inguinal adenopathy.  No evidence of free abdominal air. Sagittal images of the spine shows no destructive bony lesions. Mild degenerative changes lower thoracic spine. No destructive bony lesions are noted within pelvis. IMPRESSION: 1. Again noted  subtle nodular contour of the liver consistent with cirrhosis. Significant geographic fatty infiltration of the liver. 2. No hydronephrosis or hydroureter. 3. Abdominal and pelvic ascites is noted. 4. No small bowel obstruction. No pericecal inflammation. Normal appendix partially visualized. 5. No adnexal mass.  Atrophic uterus. Electronically Signed   By: LLahoma CrockerM.D.   On: 10/15/2015 13:39    Procedures Procedures (including critical care time)  Medications Ordered in ED Medications  potassium chloride SA (K-DUR,KLOR-CON) CR tablet 40 mEq (40 mEq Oral Not Given 10/15/15 1414)  potassium chloride 10 mEq in 100 mL IVPB (10 mEq Intravenous New Bag/Given 10/15/15 1339)  sodium chloride 0.9 % bolus 500 mL (0 mLs Intravenous Stopped 10/15/15 1156)  ondansetron (ZOFRAN) injection 4 mg (4 mg Intravenous Given 10/15/15 1045)  iopamidol (ISOVUE-300) 61 % injection (100 mLs  Contrast Given 10/15/15 1314)  LORazepam (ATIVAN) injection 1 mg (1 mg Intravenous Given 10/15/15 1338)     Initial Impression / Assessment and Plan / ED Course  I have reviewed the triage vital signs and the nursing notes.  Pertinent labs & imaging results that were available during my care of the patient were reviewed by me and considered in my medical decision making (see chart for details).  Clinical Course  Value Comment By Time  DG Chest 2 View Normal cardiac silhouette. No evidence of consolidation, effusion, or PTX. No free air under diaphragm.  [place]oxanna Mew PV[place]09/06 1433  CT Abdomen Pelvis W Contrast Reviewed ARoxanna Mew PA-C 09/06 1436    Patient presents to ED complaint of abdominal pain and distension. Patient is afebrile and chronically ill appearing. She is no acute distress. Vital signs stable. Physical exam remarkable for abdominal distension and generalized abdominal pain. Concern for possible ascites secondary to cirrhosis. Will check labs. Given SOB will CXR and troponin. Given abdominal pain  will CT abdomen/pelvis. Ativan given for nausea and pain.    Labs remarkable for critically low potassium at <2.0. PO potassium and IV potassium initiated. EKG shows sinus rhythm  with prolonged QT and repol abnormality. CMP also remarkable for hyponatremia and hypochloremia - may be secondary to dehydration, dry mucous membranes and >80 ketones on urine. AST, alk phos, and bili elevated. AG 19, nml glucose. CBC re-assuring - no leukocytosis, no thrombocytopenia; patient is afebrile - lower suspicion for SBP. Lipase normal. Ammonia elevated - most likely secondary to cirrhosis. U/A negative for UTI. CXR negative for acute cardiopulmonary process. CT abdomen/pelvis remarkable for abdominal and pelvic ascites. Consult to hospitalist for admission for hypokalemia and ascites. Troponin, ethanol, PT/INR pending.   On re-evaluation patient is hypoxic with O2 sats in low 90s to 89%. Places on [place] O2.   1:45 PM: Spoke with Dyanne Carrel of [facility], greatly appreciated her time. Agree to admit patient.   2:00 PM: Spoke with Dr. Marily Memos of [facility], greatly appreciate his time - patient is [place] patient - admit to [facility].   2:30 PM: Spoke with Family Practice, greatly appreciate their time and input. Patient to be admitted for further management of hypokalemia and ascites.   Final Clinical Impressions(s) / ED Diagnoses   Final diagnoses:  Hypokalemia  Alcoholic cirrhosis of liver with ascites [facility])    New Prescriptions New Prescriptions   No medications on file     Roxanna Mew, PA-C 10/15/15 [place], MD 10/16/15 [phone]

## 2015-10-15 NOTE — H&P (Signed)
Family Medicine Teaching [facility] Admission History and Physical Service Pager: [phone]  Patient name: Anne Black Medical record number: [id number] Date of birth: 07-20-1960 Age: 55 y.o. Gender: female  Primary Care Provider: No PCP Per Patient Consultants: IR Code Status: FULL   Chief Complaint: nausea  Assessment and Plan: Anne Black is a 55 y.o. female presenting with nausea x1 week. PMH is significant for alcohol cirrhosis with ascites, alcohol abuse, bipolar disorder, GERD, anxiety, MS.   Alcoholic cirrhosis with ascites: Diagnosed 05/2015. Pt endorses a 2 year history of heavy EtOH abuse, but charts indicate much further.  MELD score 6 (6% 3 month mortality). In April, Hep B surf Ag neg, HCV neg, Hep A IgM neg, Hep C IgM neg. Ceruloplasmin normal. ANA neg. Anti-smith antibody normal. Was discharged with xifaxin from that admission, as she refused lactulose. Also discharged on lasix 40mg  PO daily and aldactone 100mg  BID. Hypokalemic at that time as well. HIV neg 06/2015. INr 1.15. AST 80, ALT 30. Bili 2.6. Lipase 31. Ammonia 61.  Moderate ascites noted in abdomen and pelvis on CT. Likely transudative fluid caused by cirrosis as pt is afebrile and WBC only mildly elevated at ED visit on 9/5 and normalized now.  -admit to stepdown, attending Jennette Kettle -restarted lactulose 20g BID -restart rifaximin -consult IR for paracentesis (will need labs on the fluid) -Consider starting antibiotics for possible SBP if patient spikes fevers or decompensates  Hyponatremia: Na 133, recently 131-136. Likely in the setting of taking poor PO. Chronic EtOH abuse also likely contributing.  -consider serum osms and further workup if persists after hydration -hydrate with NS at   Hypokalemia: Chronic issue for pt. K <3.0 on multiple admissions in the past. K <2.0 in ED on 9/5 and on admission today. S/p K IV runs 10 mEq x3.  -replete K with IV runs x3 -recheck BMP this evening and  replete additionally as needed  -holding home lasix given low potassium   Hypomagnesemia: Mg 1.5.  -replete with 2g  -re-check Mag in AM   Alcohol abuse: Pt drinks large amounts of vodka. Last drink 1 day prior to admission. Ethanol level 183 on admission. -stepdown CIWA protocol -social work consult -thiamine, folate, multivitamin  Bipolar disorder and anxiety: Pt with many admissions for IVC and psych management. On prozac 20mg , gabapentin 200mg  TID, topamax 100mg  QD, risperidone 0.5mg  QHS. -continued home meds  Multiple Sclerosis: Pt follows with [facility] neurology.  -continue home symmetrel  Prolonged QTc: QTc 595 on admission today.  -avoid QTc prolonging agents, specifically zofran  FEN/GI: heart healthy diet; PPI Prophylaxis: lovenox  Disposition: admit to stepdown  History of Present Illness:  Anne Black is a 55 y.o. female presenting with nausea. This began 1 week ago. She has not actually vomited, just having dry heaving with diffuse abdominal pain that radiates to the lower back. Reports h/o cirrhosis. Concerned that she is dehydrated from taking Lasix 40 mg daily. Started taking it again one week ago. Follows with [facility] intermittently. Hasn't been eating due to lack of appetite x 1 week. Only urinates when she takes Lasix. Feels like its hard to pee. Sometimes it burns. Has MS and has had trouble urinating due to that. Has not been taking Oxybutynin.   Pt reports a 2 year history of alcohol abuse, drinks 3 "glasses" of vodka per day. Last drink yesterday.   Incontinent of stool. Has not been taking Lactulose at home because it causes her to have diarrhea.  However, feels that she might be "impacted" today despite having one small BM yesterday.  Last paracentesis April 2017. +Chills. Afebrile at home. Abdomen feels larger than usual.   Review Of Systems: Per HPI with the following additions: none. Otherwise the remainder of the systems were negative.  Patient Active  Problem List   Diagnosis Date Noted  . Acute respiratory failure (HCC) 10/15/2015  . Ascites 10/15/2015  . Osteoporosis   . Cirrhosis (HCC)   . Bipolar disorder (HCC)   . Multiple sclerosis (HCC)   . Alcoholic cirrhosis of liver with ascites (HCC) 08/22/2015  . Bipolar disorder, current episode mixed, moderate (HCC) 08/01/2015  . Alcohol use disorder, severe, dependence (HCC) 07/31/2015  . Alcoholic cirrhosis (HCC) 05/31/2015  . Alcoholic hepatitis with ascites 05/31/2015  . Macrocytic anemia- due to alcohol abuse with normal B12 & folate levels 05/31/2015  . Severe protein-calorie malnutrition (HCC) 05/31/2015  . Hypokalemia 05/26/2015  . Encephalopathy, hepatic (HCC) 05/26/2015  . Anxiety 05/18/2015  . Sepsis (HCC) 05/18/2015  . UTI (lower urinary tract infection) 05/18/2015  . Stimulant abuse 12/27/2014  . Nicotine abuse 12/27/2014  . Hyperprolactinemia (HCC) 11/15/2014  . Dyslipidemia   . Noncompliance with therapeutic plan 04/04/2013  . Unspecified hereditary and idiopathic peripheral neuropathy 05/01/2012  . GERD (gastroesophageal reflux disease) 05/01/2012  . Osteoarthrosis, unspecified whether generalized or localized, involving lower leg 05/01/2012  . MS (multiple sclerosis) (HCC) 09/14/2011    Past Medical History: Past Medical History:  Diagnosis Date  . ADHD (attention deficit hyperactivity disorder)   . Anxiety   . Arthritis   . Bipolar disorder (HCC)   . Cirrhosis (HCC)   . Hypokalemia   . Migraine   . Multiple sclerosis (HCC)   . Osteoporosis     Past Surgical History: Past Surgical History:  Procedure Laterality Date  . CESAREAN SECTION  [id number]  . MYRINGOTOMY WITH TUBE PLACEMENT Bilateral   . TONSILLECTOMY      Social History: Social History  Substance Use Topics  . Smoking status: Current Every Day Smoker    Packs/day: 0.75    Years: 20.00    Types: Cigarettes  . Smokeless tobacco: Never Used     Comment: is cutting back, not in the  car, house  . Alcohol use 4.8 oz/week    2 Glasses of wine, 2 Cans of beer, 4 Standard drinks or equivalent per week     Comment: "I'm a social drinker." "I drink a glass of wine nightly."   Additional social history: Drinks 3-4 "glasses" of vodka. A 750 mL of vodka will last her about 3-4 days. Smokes 1ppd since high school.  Denies drug use.  Please also refer to relevant sections of EMR.  Family History: Family History  Problem Relation Age of Onset  . Heart disease Mother   . Heart disease Father   . Hypertension Father    Allergies and Medications: No Known Allergies No current facility-administered medications on file prior to encounter.    Current Outpatient Prescriptions on File Prior to Encounter  Medication Sig Dispense Refill  . albuterol (PROVENTIL HFA;VENTOLIN HFA) 108 (90 Base) MCG/ACT inhaler Inhale 2 puffs into the lungs every 6 (six) hours as needed for wheezing or shortness of breath.    Marland Kitchen amantadine (SYMMETREL) 100 MG capsule Take 1 capsule (100 mg total) by mouth 2 (two) times daily. 30 capsule 0  . FLUoxetine (PROZAC) 20 MG capsule Take 20 mg by mouth daily.   0  . Fluticasone-Salmeterol (ADVAIR) 250-50  MCG/DOSE AEPB Inhale 1 puff into the lungs 2 (two) times daily.    . folic acid (FOLVITE) 1 MG tablet Take 1 tablet (1 mg total) by mouth daily. 30 tablet 1  . furosemide (LASIX) 40 MG tablet Take 1 tablet (40 mg total) by mouth daily. 30 tablet 1  . gabapentin (NEURONTIN) 100 MG capsule Take 2 capsules (200 mg total) by mouth 3 (three) times daily. 90 capsule 0  . lactulose (CHRONULAC) 10 GM/15ML solution Take 30 mLs (20 g total) by mouth 2 (two) times daily. 240 mL 0  . LORazepam (ATIVAN) 0.5 MG tablet Take 0.5 tablets (0.25 mg total) by mouth every 8 (eight) hours as needed for anxiety. 12 tablet 0  . Multiple Vitamin (MULTIVITAMIN WITH MINERALS) TABS tablet Take 1 tablet by mouth daily. 30 tablet 1  . nicotine (NICODERM CQ - DOSED IN MG/24 HOURS) 21 mg/24hr  patch Place 1 patch (21 mg total) onto the skin daily. 28 patch 0  . oxybutynin (DITROPAN-XL) 5 MG 24 hr tablet Take 5 mg by mouth at bedtime.     . pantoprazole (PROTONIX) 40 MG tablet Take 40 mg by mouth daily.    . rifaximin (XIFAXAN) 550 MG TABS tablet Take 1 tablet (550 mg total) by mouth 2 (two) times daily. 60 tablet 0  . risperiDONE (RISPERDAL) 0.5 MG tablet Take 1 tablet (0.5 mg total) by mouth at bedtime. 30 tablet 0  . spironolactone (ALDACTONE) 100 MG tablet Take 1 tablet (100 mg total) by mouth daily. 30 tablet 1  . thiamine 100 MG tablet Take 1 tablet (100 mg total) by mouth daily. 30 tablet 1  . topiramate (TOPAMAX) 100 MG tablet Take 1 tablet (100 mg total) by mouth daily. 30 tablet 0    Objective: BP 108/64   Pulse 98   Temp 98.6 F (37 C) (Oral)   Resp 19   SpO2 99%  Exam: General: Jaundiced female with protuberant abdomen resting in bed. Eyes: PEERLA, EOMI ENTM: moist mucous membranes Cardiovascular: rrr, no m/r/g, 1+ LE edema bilaterally to ankles  Respiratory: CTAB, no crackles or wheezes Abdomen: +distended, +fluid wave, +TTP diffusely, no rebound or guarding Skin: no rashes, bruising over R upper extremity Neuro: CN II-XII grossly intact, A&O to person, place, and year not oriented to month  Psych: mood and affect appropriate although slowed  Labs and Imaging: CBC BMET   Recent Labs Lab 10/15/15 1037  WBC 9.0  HGB 13.2  HCT 38.4  PLT 170    Recent Labs Lab 10/15/15 1037  NA 133*  K <2.0*  CL 81*  CO2 33*  BUN <5*  CREATININE 0.61  GLUCOSE 84  CALCIUM 8.2*     Trop - <0.03 Ammonia 61 Lipase normal  Garth BignessKathryn Timberlake, MD 10/15/2015, 3:09 PM PGY-1, [place] [facility] FPTS Intern pager: [phone], text pages welcome   Upper Level Addendum:  I have seen and evaluated this patient along with Dr. Chanetta Marshallimberlake and reviewed the above note, making necessary revisions in green.   Marcy Sirenatherine Keyari Kleeman, D.O. 10/15/2015, 4:57 PM PGY-2, [facility]

## 2015-10-15 NOTE — ED Notes (Signed)
Pt. Calling out to nurses station requesting medication for nausea and constipation. This RN at bedside. Pt. Being transported to xray. Will notify RN.

## 2015-10-15 NOTE — ED Notes (Signed)
Attempted report 

## 2015-10-16 ENCOUNTER — Inpatient Hospital Stay (HOSPITAL_COMMUNITY): Payer: Managed Care, Other (non HMO)

## 2015-10-16 LAB — BODY FLUID CELL COUNT WITH DIFFERENTIAL
EOS FL: 0 %
LYMPHS FL: 47 %
Monocyte-Macrophage-Serous Fluid: 33 % — ABNORMAL LOW (ref 50–90)
NEUTROPHIL FLUID: 20 % (ref 0–25)
Other Cells, Fluid: 0 %
WBC FLUID: 71 uL (ref 0–1000)

## 2015-10-16 LAB — ALBUMIN, FLUID (OTHER)

## 2015-10-16 LAB — BASIC METABOLIC PANEL
ANION GAP: 9 (ref 5–15)
ANION GAP: 18 — AB (ref 5–15)
ANION GAP: 17 — AB (ref 5–15)
BUN: <5 mg/dL — ABNORMAL LOW (ref 6–20)
CALCIUM: 7.9 mg/dL — AB (ref 8.9–10.3)
CALCIUM: 7.9 mg/dL — AB (ref 8.9–10.3)
CO2: 29 mmol/L (ref 22–32)
CO2: 28 mmol/L (ref 22–32)
CO2: 30 mmol/L (ref 22–32)
Calcium: 8 mg/dL — ABNORMAL LOW (ref 8.9–10.3)
Chloride: 85 mmol/L — ABNORMAL LOW (ref 101–111)
Chloride: 86 mmol/L — ABNORMAL LOW (ref 101–111)
Chloride: 89 mmol/L — ABNORMAL LOW (ref 101–111)
Creatinine, Ser: 0.6 mg/dL (ref 0.44–1.00)
Creatinine, Ser: 0.67 mg/dL (ref 0.44–1.00)
Creatinine, Ser: 0.63 mg/dL (ref 0.44–1.00)
GFR calc Af Amer: >60 mL/min (ref >60)
GFR calc Af Amer: >60 mL/min (ref >60)
GFR calc non Af Amer: >60 mL/min (ref >60)
GLUCOSE: 106 mg/dL — AB (ref 65–99)
GLUCOSE: 86 mg/dL (ref 65–99)
GLUCOSE: 93 mg/dL (ref 65–99)
POTASSIUM: 2.3 mmol/L — AB (ref 3.5–5.1)
Potassium: 2.2 mmol/L — CL (ref 3.5–5.1)
Potassium: 2.8 mmol/L — ABNORMAL LOW (ref 3.5–5.1)
SODIUM: 128 mmol/L — AB (ref 135–145)
SODIUM: 132 mmol/L — AB (ref 135–145)
Sodium: 131 mmol/L — ABNORMAL LOW (ref 135–145)

## 2015-10-16 LAB — GRAM STAIN

## 2015-10-16 LAB — BLOOD GAS, ARTERIAL
Acid-Base Excess: 8.2 mmol/L — ABNORMAL HIGH (ref 0.0–2.0)
Bicarbonate: 31.6 mmol/L — ABNORMAL HIGH (ref 20.0–28.0)
DRAWN BY: 129711
O2 CONTENT: 2 L/min
O2 Saturation: 95.2 %
PCO2 ART: 39 mmHg (ref 32.0–48.0)
PH ART: 7.519 — AB (ref 7.350–7.450)
Patient temperature: 98.6
pO2, Arterial: 77.1 mmHg — ABNORMAL LOW (ref 83.0–108.0)

## 2015-10-16 LAB — LACTATE DEHYDROGENASE, PLEURAL OR PERITONEAL FLUID: LD FL: 42 U/L — AB (ref 3–23)

## 2015-10-16 LAB — GAMMA GT: GGT: 153 U/L — ABNORMAL HIGH (ref 7–50)

## 2015-10-16 LAB — PROTEIN, BODY FLUID: Total protein, fluid: <3 g/dL

## 2015-10-16 LAB — CBC
HEMATOCRIT: 33.5 % — AB (ref 36.0–46.0)
Hemoglobin: 11.3 g/dL — ABNORMAL LOW (ref 12.0–15.0)
MCH: 32.1 pg (ref 26.0–34.0)
MCHC: 33.7 g/dL (ref 30.0–36.0)
MCV: 95.2 fL (ref 78.0–100.0)
Platelets: 125 10*3/uL — ABNORMAL LOW (ref 150–400)
RBC: 3.52 MIL/uL — AB (ref 3.87–5.11)
RDW: 16.4 % — ABNORMAL HIGH (ref 11.5–15.5)
WBC: 8.9 10*3/uL (ref 4.0–10.5)

## 2015-10-16 LAB — OSMOLALITY: OSMOLALITY: 271 mosm/kg — AB (ref 275–295)

## 2015-10-16 LAB — MAGNESIUM: Magnesium: 2.1 mg/dL (ref 1.7–2.4)

## 2015-10-16 LAB — GLUCOSE, PERITONEAL FLUID: Glucose, Peritoneal Fluid: 112 mg/dL

## 2015-10-16 LAB — PHOSPHORUS: Phosphorus: 1.2 mg/dL — ABNORMAL LOW (ref 2.5–4.6)

## 2015-10-16 MED ORDER — POTASSIUM CHLORIDE CRYS ER 20 MEQ PO TBCR
40.0000 meq | EXTENDED_RELEASE_TABLET | Freq: Three times a day (TID) | ORAL | Status: DC
  Filled 2015-10-16: qty 2

## 2015-10-16 MED ORDER — ENSURE ENLIVE PO LIQD: 237.0000 mL | Freq: Two times a day (BID) | ORAL | 12 refills | Status: DC

## 2015-10-16 MED ORDER — POTASSIUM CHLORIDE ER 20 MEQ PO TBCR: 40.0000 meq | EXTENDED_RELEASE_TABLET | Freq: Every day | ORAL | 0 refills | Status: DC

## 2015-10-16 MED ORDER — PROMETHAZINE HCL 25 MG/ML IJ SOLN
6.2500 mg | Freq: Four times a day (QID) | INTRAMUSCULAR | Status: DC | PRN
  Administered 2015-10-16 – 2015-10-21 (×3): 6.25 mg via INTRAVENOUS
  Filled 2015-10-16 (×3): qty 1

## 2015-10-16 MED ORDER — POTASSIUM CHLORIDE 10 MEQ/100ML IV SOLN
10.0000 meq | INTRAVENOUS | Status: AC
  Administered 2015-10-16 (×6): 10 meq via INTRAVENOUS
  Filled 2015-10-16 (×6): qty 100

## 2015-10-16 MED ORDER — POTASSIUM CHLORIDE CRYS ER 20 MEQ PO TBCR
40.0000 meq | EXTENDED_RELEASE_TABLET | Freq: Three times a day (TID) | ORAL | Status: DC
  Administered 2015-10-16 (×2): 40 meq via ORAL
  Filled 2015-10-16 (×2): qty 2

## 2015-10-16 MED ORDER — ENSURE ENLIVE PO LIQD
237.0000 mL | Freq: Two times a day (BID) | ORAL | Status: DC
  Administered 2015-10-17 – 2015-10-22 (×3): 237 mL via ORAL

## 2015-10-16 MED ORDER — LIDOCAINE HCL (PF) 1 % IJ SOLN
INTRAMUSCULAR | Status: AC
  Filled 2015-10-16: qty 10

## 2015-10-16 MED ORDER — POLYETHYLENE GLYCOL 3350 17 G PO PACK: 17.0000 g | PACK | Freq: Every day | ORAL | 0 refills | Status: DC | PRN

## 2015-10-16 NOTE — Discharge Summary (Signed)
Family Medicine Teaching [facility] Discharge Summary  Patient name: Anne Black Medical record number: [id number] Date of birth: August 14, 1960 Age: 55 y.o. Gender: female Date of Admission: 10/15/2015  Date of Discharge: 10/24/15 Admitting Physician: Donnal Debar, MD  Primary Care Provider: No PCP Per Patient Consult ants: IR for paracentesis  Indication for Hospitalization: ascites   Discharge Diagnoses/Problem List:  Alcoholic Cirrhosis UTI, treated with keflex for 7 days, end date 10/23/15 Hyponatremia, resolved Hypokalemia, resolved Hypomagnesemia, resolved Alcohol abuse Bipolar disorder and anxiety Multiple Sclerosis Prolonged QTc  Disposition: SNF  Discharge Condition: stable  Discharge Exam:  General: Overweight female sitting up in bed. Cardiovascular: rrr, no m/r/g, 1+ LE edema bilaterally to ankles  Respiratory: CTAB, no crackles or wheezes Abdomen: mildly distended, but soft Skin: no rashes, bruising over R upper extremity Neuro: AOx4.  Brief Hospital Course:  Patient presented with nausea 1 week. No vomiting. Intermittently received care with [facility] urgent care for her primary. Poor by mouth intake prior to admission 1 week. Poor adherence to medication regimen for alcohol cirrhosis prior to admission. Endorsed drinking greater than 3 classes of 5 Per day for many years prior to admission. New diagnosis of cirrhosis on previous admission in April 2017. In the ED, potassium was noted to be: Critically low at less than 2. Repletion was started but patient refused by mouth potassium and IV was started. EKG with prolonged QTC but no other changes. Patient was on but also hyponatremic and hypochloremic with dry mucous membranes and greater than 80 ketones on urine, AST was almost double ALT. Ammonia 61. Ethanol level elevated. INR 1.15 without any signs of obvious bleeding. Patient was admitted to the floor for monitoring of alcohol withdrawal and therapeutic  and diagnostic paracentesis. Paracentesis was performed, fluid was found to be transudative in nature and caused by portal venous hypertension as evidenced by SAAG. Patient endorsed desires to be discharged, however she was very unsteady on her feet when standing to go to the restroom and began hallucinating. Ativan was given per CIWA protocol. Magnesium and phosphorus were repleted as well. Abdominal distention recurred, but not enough fluid for paracentesis. Continued potassium repletion. 9/14-9/15 K stable at 3.8 without repletion. Continuing magnesium repletion as alcoholics are frequently intracellularly hypomagnesemic despite serum levels.  Issues for Follow Up:  1. Consider stopping risperidone 2/2 QTc prolongation. 2. Counsel regarding smoking and alcohol cessation. 3. Follow up nutrition status > patient takes poor PO when drinking alcohol.  4. Keflex for UTI - end date 10/23/15.  5. Pt reports yeast infection, started miconazole 9/15.   Significant Procedures: paracentesis x1 for 3.4L transudative fluid consistent with portal venous hypertension  Significant Labs and Imaging:  No results for input(s): WBC, HGB, HCT, PLT in the last 168 hours.  Recent Labs Lab 10/21/15 0413 10/21/15 1548 10/22/15 0655 10/23/15 1001 10/24/15 0611  NA 136 136 136 137 138  K 2.9* 3.4* 3.4* 3.8 3.8  CL 105 108 108 111 109  CO2 24 22 20* 22 24  GLUCOSE 95 100* 118* 108* 90  BUN <5* <5* <5* <5* <5*  CREATININE 0.51 0.50 0.50 0.51 0.44  CALCIUM 8.1* 8.3* 8.3* 8.5* 8.4*  MG  --   --   --   --  1.8  ALKPHOS 149*  --   --   --   --   AST 47*  --   --   --   --   ALT 22  --   --   --   --  ALBUMIN 2.1*  --   --   --   --     Results/Tests Pending at Time of Discharge: none  Discharge Medications:    Medication List    STOP taking these medications   meloxicam 15 MG tablet Commonly known as:  MOBIC   oxybutynin 5 MG 24 hr tablet Commonly known as:  DITROPAN-XL     TAKE these medications    albuterol 108 (90 Base) MCG/ACT inhaler Commonly known as:  PROVENTIL HFA;VENTOLIN HFA Inhale 2 puffs into the lungs every 6 (six) hours as needed for wheezing or shortness of breath.   amantadine 100 MG capsule Commonly known as:  SYMMETREL Take 1 capsule (100 mg total) by mouth 2 (two) times daily.   baclofen 10 MG tablet Commonly known as:  LIORESAL Take 1 tablet (10 mg total) by mouth 3 (three) times daily as needed for muscle spasms.   feeding supplement (ENSURE ENLIVE) Liqd Take 237 mLs by mouth 2 (two) times daily between meals.   feeding supplement (ENSURE ENLIVE) Liqd Take 237 mLs by mouth daily.   FLUoxetine 20 MG capsule Commonly known as:  PROZAC Take 20 mg by mouth daily.   Fluticasone-Salmeterol 250-50 MCG/DOSE Aepb Commonly known as:  ADVAIR Inhale 1 puff into the lungs 2 (two) times daily.   folic acid 1 MG tablet Commonly known as:  FOLVITE Take 1 tablet (1 mg total) by mouth daily.   furosemide 40 MG tablet Commonly known as:  LASIX Take 1 tablet (40 mg total) by mouth daily. What changed:  Another medication with the same name was added. Make sure you understand how and when to take each.   furosemide 40 MG tablet Commonly known as:  LASIX Take 1 tablet (40 mg total) by mouth daily. What changed:  You were already taking a medication with the same name, and this prescription was added. Make sure you understand how and when to take each.   gabapentin 100 MG capsule Commonly known as:  NEURONTIN Take 2 capsules (200 mg total) by mouth 3 (three) times daily.   lactulose 10 GM/15ML solution Commonly known as:  CHRONULAC Take 30 mLs (20 g total) by mouth 2 (two) times daily.   LORazepam 0.5 MG tablet Commonly known as:  ATIVAN Take 0.5 tablets (0.25 mg total) by mouth every 8 (eight) hours as needed for anxiety.   Melatonin 3 MG Tabs Take 1 tablet (3 mg total) by mouth at bedtime as needed (sleep).   miconazole 100 MG vaginal  suppository Commonly known as:  MICOTIN Place 1 suppository (100 mg total) vaginally at bedtime.   multivitamin with minerals Tabs tablet Take 1 tablet by mouth daily.   nicotine 21 mg/24hr patch Commonly known as:  NICODERM CQ - dosed in mg/24 hours Place 1 patch (21 mg total) onto the skin daily. What changed:  Another medication with the same name was added. Make sure you understand how and when to take each.   nicotine 21 mg/24hr patch Commonly known as:  NICODERM CQ - dosed in mg/24 hours Place 1 patch (21 mg total) onto the skin daily. What changed:  You were already taking a medication with the same name, and this prescription was added. Make sure you understand how and when to take each.   pantoprazole 40 MG tablet Commonly known as:  PROTONIX Take 40 mg by mouth daily.   polyethylene glycol packet Commonly known as:  MIRALAX / GLYCOLAX Take 17 g by mouth daily as  needed for mild constipation.   Potassium Chloride ER 20 MEQ Tbcr Take 40 mEq by mouth daily.   rifaximin 550 MG Tabs tablet Commonly known as:  XIFAXAN Take 1 tablet (550 mg total) by mouth 2 (two) times daily.   risperiDONE 0.5 MG tablet Commonly known as:  RISPERDAL Take 1 tablet (0.5 mg total) by mouth at bedtime.   spironolactone 100 MG tablet Commonly known as:  ALDACTONE Take 1 tablet (100 mg total) by mouth daily.   thiamine 100 MG tablet Take 1 tablet (100 mg total) by mouth daily.   topiramate 100 MG tablet Commonly known as:  TOPAMAX Take 1 tablet (100 mg total) by mouth daily.       Discharge Instructions: Please refer to Patient Instructions section of EMR for full details.  Patient was counseled important signs and symptoms that should prompt return to medical care, changes in medications, dietary instructions, activity restrictions, and follow up appointments.   Follow-Up Appointments: Family Medicine Center 11/04/15 10:45am  Garth Bigness, MD 10/27/2015, 8:59 PM PGY-1,  [facility]

## 2015-10-16 NOTE — Clinical Social Work Note (Signed)
Clinical Social Work Assessment  Patient Details  Name: Anne Black MRN: [id number] Date of Birth: [birth year]  Date of referral:  10/15/15               Reason for consult:  Substance Use/ETOH Abuse                Permission sought to share information with:    Permission granted to share information::  No  Name::        Agency::     Relationship::     Contact Information:     Housing/Transportation Living arrangements for the past 2 months:  Single Family Home Source of Information:  Patient, Medical Team Patient Interpreter Needed:  None Criminal Activity/Legal Involvement Pertinent to Current Situation/Hospitalization:  No - Comment as needed Significant Relationships:  Adult Children Lives with:  Self Do you feel safe going back to the place where you live?  Yes Need for family participation in patient care:  Yes (Comment)  Care giving concerns:  Patient regularly uses ETOH.   Social Worker assessment / plan:  CSW met with patient. No supports at bedside. CSW introduced role and inquired if patient would be interested in any substance abuse resources. Patient stated that she is working toward getting treatment. She was recently assessed by [facility] but they declined. CSW provided list of residential and outpatient substance abuse treatment facilities. Patient could not find her glasses so she was not able to read it then began to complain of pain and wanting pain medication. RN notified. No further concerns. CSW encouraged patient to contact CSW as needed. CSW signing off as social work intervention no longer needed.  Employment status:  Disabled (Comment on whether or not currently receiving Disability) Insurance information:  Medicare PT Recommendations:  Not assessed at this time Information / Referral to community resources:  Outpatient Substance Abuse Treatment Options, Residential Substance Abuse Treatment Options  Patient/Family's Response to care:  Patient  agreeable to receiving substance abuse resources. Patient's family supportive and involved in patient's care. Patient appreciated social work intervention.  Patient/Family's Understanding of and Emotional Response to Diagnosis, Current Treatment, and Prognosis:  Patient understands need for substance abuse resources consult and is actively seeking treatment. Patient appears happy with hospital care.  Emotional Assessment Appearance:  Appears stated age Attitude/Demeanor/Rapport:  Other (Pleasant but in pain) Affect (typically observed):  Accepting, Appropriate, Calm, Pleasant Orientation:  Oriented to Self, Oriented to Place, Oriented to  Time, Oriented to Situation Alcohol / Substance use:  Alcohol Use Psych involvement (Current and /or in the community):  No (Comment)  Discharge Needs  Concerns to be addressed:  Care Coordination Readmission within the last 30 days:  No Current discharge risk:  Substance Abuse, Psychiatric Illness Barriers to Discharge:  Active Substance Use   Anne Chroman, LCSW 10/16/2015, 12:05 PM

## 2015-10-16 NOTE — Progress Notes (Signed)
Critical Potassium 2.2, MD gave 6 runs of potassium.

## 2015-10-16 NOTE — Progress Notes (Signed)
Initial Nutrition Assessment  DOCUMENTATION CODES:   Obesity unspecified  INTERVENTION:    Continue to replete potassium and phosphorus.   Recommend monitor magnesium, potassium, and phosphorus BID for at least 3 days, MD to replete as needed, as pt is at risk for refeeding syndrome given hx of alcohol abuse with recent minimal PO intake.  Add Ensure Enlive PO BID starting tonight, each supplement provides 350 kcal and 20 grams of protein.  NUTRITION DIAGNOSIS:   Inadequate oral intake related to lethargy/confusion as evidenced by meal completion < 50%.  GOAL:   Patient will meet greater than or equal to 90% of their needs  MONITOR:   PO intake, Supplement acceptance, Labs, Weight trends, I & O's  REASON FOR ASSESSMENT:   Consult Assessment of nutrition requirement/status  ASSESSMENT:   55 y.o. female presenting with nausea x1 week. PMH is significant for alcohol cirrhosis with ascites, alcohol abuse, bipolar disorder, GERD, anxiety, MS.   Received MD consult for assessment of nutrition status with concern for beer potomania and refeeding syndrome. Patient has a long hx of alcohol abuse. She has been eating poorly at home per H&P. Patient is at risk for refeeding syndrome, potassium and phosphorus are low.  Nutrition-Focused physical exam completed. Findings are no fat depletion, no muscle depletion, and mild edema.   Patient going for a paracentesis today. Expect weight to trend down with fluid removal.   Labs reviewed: sodium low at 131, potassium low at 2.3, phosphorus low at 2.1, magnesium WNL at 2.1 Medications reviewed and include folic acid, thiamine, Aldactone, potassium chloride.  Expect serum levels of potassium, phosphorus, magnesium to decline with initiation of PO's. Will need to monitor electrolytes BID and replete as needed.   Diet Order:  Diet Heart Room service appropriate? Yes; Fluid consistency: Thin  Skin:  Reviewed, no issues  Last BM:   9/7  Height:   Ht Readings from Last 1 Encounters:  10/15/15 5\' 2"  (1.575 m)    Weight:   Wt Readings from Last 1 Encounters:  10/15/15 170 lb 13.7 oz (77.5 kg)    Ideal Body Weight:  50 kg  BMI:  Body mass index is 31.25 kg/m.  Estimated Nutritional Needs:   Kcal:  1600-1800  Protein:  80-100 gm  Fluid:  1.6-1.8 L  EDUCATION NEEDS:   No education needs identified at this time  Joaquin Courts, RD, LDN, CNSC Pager [phone] After Hours Pager [phone]

## 2015-10-16 NOTE — Procedures (Signed)
Successful US guided paracentesis from right lateral abdomen.  Yielded 3.38 of clear yellow fluid.  No immediate complications.  Pt tolerated well.   Specimen was sent for labs.  Navid Lenzen S Cristian Grieves PA-C 10/16/2015 2:56 PM

## 2015-10-16 NOTE — Care Management Note (Signed)
Case Management Note  Patient Details  Name: Anne Black MRN: [id number] Date of Birth: Sep 15, 1960  Subjective/Objective:   Patient presents with Cirrhosis / ascites, s/p paracentesis, NCM will cont to follow for dc needs.                  Action/Plan:   Expected Discharge Date:                  Expected Discharge Plan:  Home/Self Care  In-House Referral:     Discharge planning Services  CM Consult  Post Acute Care Choice:    Choice offered to:     DME Arranged:    DME Agency:     HH Arranged:    HH Agency:     Status of Service:  In process, will continue to follow  If discussed at Long Length of Stay Meetings, dates discussed:    Additional Comments:  Leone Haven, RN 10/16/2015, 5:39 PM

## 2015-10-16 NOTE — Progress Notes (Signed)
Family Medicine Teaching Service Daily Progress Note Intern Pager: [phone]  Patient name: Anne Black Medical record number: [id number] Date of birth: [birth year] Age: 55 y.o. Gender: female  Primary Care Provider: No PCP Per Patient Consultants: none Code Status: FULL  Pt Overview and Major Events to Date:  9/6: Total of 10 runs of K  Assessment and Plan: Anne Black is a 55 y.o. female presenting with nausea x1 week. PMH is significant for alcohol cirrhosis with ascites, alcohol abuse, bipolar disorder, GERD, anxiety, MS.   Alcoholic cirrhosis with ascites: Diagnosed 05/2015. Pt endorses a 2 year history of heavy EtOH abuse, but charts indicate much further.  MELD score 6 (6% 3 month mortality). In April, Hep B surf Ag neg, HCV neg, Hep A IgM neg, Hep C IgM neg. Ceruloplasmin normal. ANA neg. Anti-smith antibody normal. Was discharged with xifaxin from that admission, as she refused lactulose. Also discharged on lasix 40mg  PO daily and aldactone 100mg  BID. Hypokalemic at that time as well. HIV neg 06/2015. INr 1.15. AST 80, ALT 30. Bili 2.6. Lipase 31. Ammonia 61.  Moderate ascites noted in abdomen and pelvis on CT. Likely transudative fluid caused by cirrosis as pt is afebrile and WBC only mildly elevated at ED visit on 9/5 and normalized now.  -restarted lactulose 20g BID -restart rifaximin -consult IR for paracentesis (will need labs on the fluid) -Consider starting antibiotics for possible SBP if patient spikes fevers or decompensates  Alcoholic ketoacidosis: Pt with extensive alcohol abuse for many years.  -continue hydration with NS -can transition to hydration with dextrose if K is improving -repeat BMP this afternoon  Hyponatremia: Na 133, recently 131-136. Likely in the setting of taking poor PO. Chronic EtOH abuse also likely contributing. Beer potomania vs SIADH vs third spacing vs thiazine use. -serum osms pending -hydrate with NS at   Hypokalemia: Chronic  issue for pt. K <3.0 on multiple admissions in the past. K <2.0 in ED on 9/5 and on admission. -s/p 10 runs IV K -recheck BMP this evening and replete additionally as needed  -holding home lasix given low potassium   Hypomagnesemia: Mg 1.5 on admission > 2.1 after 2g Mg 9/6.  -continue to monitor  Alcohol abuse: Pt drinks large amounts of vodka. Last drink 1 day prior to admission. Ethanol level 183 on admission. CIWA 11, 4, 4, 11. Ativan x2 given.  -stepdown CIWA protocol -social work consult -thiamine, folate, multivitamin  Bipolar disorder and anxiety: Pt with many admissions for IVC and psych management. On prozac 20mg , gabapentin 200mg  TID, topamax 100mg  QD, risperidone 0.5mg  QHS. -continued home meds  Multiple Sclerosis: Pt follows with [facility] neurology.  -continue home symmetrel  Prolonged QTc: QTc 595 on admission today.  -avoid QTc prolonging agents, specifically zofran  FEN/GI: heart healthy diet; PPI Prophylaxis: lovenox  Disposition: continued monitoring in stepdown  Subjective:  Pt sleepy this morning, reports that she doesn't eat very much at home, but is able to get food and does have a license (curious since she has a history of DUIs). Not nauseous this morning.  Objective: Temp:  [97.6 F (36.4 C)-98.6 F (37 C)] 98.2 F (36.8 C) (09/07 0336) Pulse Rate:  [91-106] 106 (09/07 0336) Resp:  [17-24] 21 (09/07 0336) BP: (102-119)/(56-71) 109/71 (09/07 0336) SpO2:  [84 %-99 %] 97 % (09/07 0336) Weight:  [170 lb 13.7 oz (77.5 kg)] 170 lb 13.7 oz (77.5 kg) (09/06 1517) Physical Exam: General: Jaundiced female with protuberant abdomen resting in bed.  Eyes: PEERLA, EOMI ENTM: moist mucous membranes Cardiovascular: rrr, no m/r/g, 1+ LE edema bilaterally to ankles  Respiratory: CTAB, no crackles or wheezes Abdomen: +distended, +fluid wave, +TTP diffusely, no rebound or guarding Skin: no rashes, bruising over R upper extremity Neuro: CN II-XII grossly intact,  A&O to person, place, and year not oriented to month  Psych: mood and affect appropriate although slowed  Laboratory:  Recent Labs Lab 10/14/15 2037 10/15/15 1037 10/16/15 0447  WBC 11.2* 9.0 8.9  HGB 12.6 13.2 11.3*  HCT 35.6* 38.4 33.5*  PLT 173 170 125*    Recent Labs Lab 10/14/15 0858  10/14/15 2037 10/15/15 1037 10/16/15 0017 10/16/15 0447  NA 134*  --  131* 133* 132* 131*  K 2.2*  --  <2.0* <2.0* 2.2* 2.3*  CL 82*  --  81* 81* 85* 86*  CO2 31  --  32 33* 29 28  BUN 5*  --  <5* <5* <5* <5*  CREATININE 0.45*  < > 0.48 0.61 0.60 0.67  CALCIUM 7.8*  --  7.9* 8.2* 7.9* 8.0*  PROT 6.4  --  6.9 7.0  --   --   BILITOT 2.0*  --  2.7* 2.6*  --   --   ALKPHOS 216*  --  224* 216*  --   --   ALT 23  --  28 30  --   --   AST 67*  --  74* 80*  --   --   GLUCOSE 78  --  88 84 86 93  < > = values in this interval not displayed.  Imaging/Diagnostic Tests: Dg Chest 2 View  Result Date: 10/15/2015 CLINICAL DATA:  Chest pain, shortness of breath, nausea EXAM: CHEST  2 VIEW COMPARISON:  08/22/2015 FINDINGS: Lungs are clear.  No pleural effusion or pneumothorax. The heart is normal in size. Visualized osseous structures are within normal limits. IMPRESSION: Normal chest radiographs. Electronically Signed   By: Charline BillsSriyesh  Krishnan M.D.   On: 10/15/2015 13:27   Ct Abdomen Pelvis W Contrast  Result Date: 10/15/2015 CLINICAL DATA:  Diffuse abdominal pain, cirrhosis, ascites EXAM: CT ABDOMEN AND PELVIS WITH CONTRAST TECHNIQUE: Multidetector CT imaging of the abdomen and pelvis was performed using the standard protocol following bolus administration of intravenous contrast. CONTRAST:  100mL ISOVUE-300 IOPAMIDOL (ISOVUE-300) INJECTION 61% COMPARISON:  05/18/2015 FINDINGS: The lung bases are unremarkable Small hiatal hernia is noted. Again noted significant fatty infiltration of the liver. Nodular contour of the liver consistent with cirrhosis. Moderate perihepatic and perisplenic ascites. Moderate  ascites is noted bilateral paracolic gutters. Moderate ascites mid lower mesentery. Enhanced pancreas is unremarkable. Enhanced spleen is unremarkable. The spleen measures 10.4 cm in length. No adrenal gland mass. Enhanced kidneys are symmetrical in size. No hydronephrosis or hydroureter. Delayed renal images shows bilateral renal symmetrical excretion. Bilateral visualized proximal ureter is unremarkable. No calcified calculi are noted within under distended urinary bladder. There is no small bowel obstruction. No pericecal inflammation. Normal appendix is partially visualized. No distal colonic obstruction. The uterus is atrophic. No adnexal masses noted. Moderate pelvic ascites. Some stool and gas noted within rectum. There is no inguinal adenopathy.  No evidence of free abdominal air. Sagittal images of the spine shows no destructive bony lesions. Mild degenerative changes lower thoracic spine. No destructive bony lesions are noted within pelvis. IMPRESSION: 1. Again noted subtle nodular contour of the liver consistent with cirrhosis. Significant geographic fatty infiltration of the liver. 2. No hydronephrosis or hydroureter. 3. Abdominal and  pelvic ascites is noted. 4. No small bowel obstruction. No pericecal inflammation. Normal appendix partially visualized. 5. No adnexal mass.  Atrophic uterus. Electronically Signed   By: Natasha Mead M.D.   On: 10/15/2015 13:39    Garth Bigness, MD 10/16/2015, 7:05 AM PGY-1, [facility] Family Medicine FPTS Intern pager: [phone], text pages welcome

## 2015-10-16 NOTE — Progress Notes (Signed)
critical potassium of 2.3 received but pt is currently receiving 6 runs of potassium.

## 2015-10-17 LAB — GLUCOSE, CAPILLARY: Glucose-Capillary: 99 mg/dL (ref 65–99)

## 2015-10-17 LAB — COMPREHENSIVE METABOLIC PANEL
ALBUMIN: 2.2 g/dL — AB (ref 3.5–5.0)
ALT: 22 U/L (ref 14–54)
ANION GAP: 6 (ref 5–15)
AST: 53 U/L — ABNORMAL HIGH (ref 15–41)
Alkaline Phosphatase: 163 U/L — ABNORMAL HIGH (ref 38–126)
BUN: <5 mg/dL — ABNORMAL LOW (ref 6–20)
CHLORIDE: 95 mmol/L — AB (ref 101–111)
CO2: 30 mmol/L (ref 22–32)
Calcium: 7.8 mg/dL — ABNORMAL LOW (ref 8.9–10.3)
Creatinine, Ser: 0.56 mg/dL (ref 0.44–1.00)
GFR calc non Af Amer: >60 mL/min (ref >60)
GLUCOSE: 86 mg/dL (ref 65–99)
POTASSIUM: 3.4 mmol/L — AB (ref 3.5–5.1)
SODIUM: 131 mmol/L — AB (ref 135–145)
Total Bilirubin: 3.3 mg/dL — ABNORMAL HIGH (ref 0.3–1.2)
Total Protein: 4.8 g/dL — ABNORMAL LOW (ref 6.5–8.1)

## 2015-10-17 LAB — CBC
HCT: 30.5 % — ABNORMAL LOW (ref 36.0–46.0)
HEMOGLOBIN: 10.3 g/dL — AB (ref 12.0–15.0)
MCH: 32.3 pg (ref 26.0–34.0)
MCHC: 33.8 g/dL (ref 30.0–36.0)
MCV: 95.6 fL (ref 78.0–100.0)
PLATELETS: 112 10*3/uL — AB (ref 150–400)
RBC: 3.19 MIL/uL — ABNORMAL LOW (ref 3.87–5.11)
RDW: 16.6 % — AB (ref 11.5–15.5)
WBC: 6.1 10*3/uL (ref 4.0–10.5)

## 2015-10-17 LAB — BASIC METABOLIC PANEL
ANION GAP: 6 (ref 5–15)
BUN: <5 mg/dL — ABNORMAL LOW (ref 6–20)
CALCIUM: 8.1 mg/dL — AB (ref 8.9–10.3)
CO2: 28 mmol/L (ref 22–32)
CREATININE: 0.69 mg/dL (ref 0.44–1.00)
Chloride: 99 mmol/L — ABNORMAL LOW (ref 101–111)
Glucose, Bld: 135 mg/dL — ABNORMAL HIGH (ref 65–99)
Potassium: 3.3 mmol/L — ABNORMAL LOW (ref 3.5–5.1)
Sodium: 133 mmol/L — ABNORMAL LOW (ref 135–145)

## 2015-10-17 MED ORDER — POTASSIUM CHLORIDE CRYS ER 20 MEQ PO TBCR
40.0000 meq | EXTENDED_RELEASE_TABLET | Freq: Once | ORAL | Status: AC
Start: 2015-10-17 — End: 2015-10-17
  Administered 2015-10-17: 40 meq via ORAL
  Filled 2015-10-17: qty 2

## 2015-10-17 MED ORDER — POTASSIUM CHLORIDE CRYS ER 20 MEQ PO TBCR
40.0000 meq | EXTENDED_RELEASE_TABLET | Freq: Once | ORAL | Status: AC
  Administered 2015-10-17: 40 meq via ORAL
  Filled 2015-10-17: qty 2

## 2015-10-17 MED ORDER — POTASSIUM CHLORIDE 10 MEQ/100ML IV SOLN
10.0000 meq | INTRAVENOUS | Status: AC
  Administered 2015-10-17 (×2): 10 meq via INTRAVENOUS
  Filled 2015-10-17 (×2): qty 100

## 2015-10-17 MED ORDER — CEPHALEXIN 500 MG PO CAPS
500.0000 mg | ORAL_CAPSULE | Freq: Two times a day (BID) | ORAL | Status: AC
  Administered 2015-10-17 – 2015-10-23 (×14): 500 mg via ORAL
  Filled 2015-10-17 (×14): qty 1

## 2015-10-17 MED ORDER — DEXTROSE-NACL 5-0.45 % IV SOLN
INTRAVENOUS | Status: AC
  Administered 2015-10-17: 12:00:00 via INTRAVENOUS

## 2015-10-17 MED ORDER — POTASSIUM CHLORIDE 10 MEQ/100ML IV SOLN
10.0000 meq | INTRAVENOUS | Status: AC
  Administered 2015-10-17 (×4): 10 meq via INTRAVENOUS
  Filled 2015-10-17 (×4): qty 100

## 2015-10-17 MED ORDER — LORAZEPAM 2 MG/ML IJ SOLN
1.0000 mg | INTRAMUSCULAR | Status: DC | PRN
  Administered 2015-10-17: 2 mg via INTRAVENOUS
  Administered 2015-10-18 – 2015-10-19 (×4): 1 mg via INTRAVENOUS
  Filled 2015-10-17 (×6): qty 1

## 2015-10-17 NOTE — Care Management Note (Signed)
Case Management Note  Patient Details  Name: RHESA PIRRAGLIA MRN: [id number] Date of Birth: February 12, 1960  Subjective/Objective:    Patient with ETOH cirrhosis/ascites, s/p paracentesis.  NCM went in to speak with patient she was really sleepy and kept going in and out .  NCM will come back later to talk with her when she is more awake.               Action/Plan:   Expected Discharge Date:                  Expected Discharge Plan:  Home w Home Health Services  In-House Referral:     Discharge planning Services  CM Consult  Post Acute Care Choice:    Choice offered to:     DME Arranged:    DME Agency:     HH Arranged:    HH Agency:     Status of Service:  In process, will continue to follow  If discussed at Long Length of Stay Meetings, dates discussed:    Additional Comments:  Leone Haven, RN 10/17/2015, 11:40 AM

## 2015-10-17 NOTE — Progress Notes (Signed)
Family Medicine Teaching Service Daily Progress Note Intern Pager: [phone]  Patient name: Anne Black Medical record number: [id number] Date of birth: Sep 08, 1960 Age: 55 y.o. Gender: female  Primary Care Provider: No PCP Per Patient Consultants: none Code Status: FULL  Pt Overview and Major Events to Date:  9/6: Total of 10 runs of K 9/7 paracentesis for 3.4L, pt tolerated well; pt asking to d/c at 5pm. Waiting on repeat BMP, then pt began hallucinating and K was 2.8.   Assessment and Plan: VERGIE ZAHM is a 55 y.o. female presenting with nausea x1 week. PMH is significant for alcohol cirrhosis with ascites, alcohol abuse, bipolar disorder, GERD, anxiety, MS.   Alcoholic cirrhosis with ascites: Diagnosed 05/2015. Pt endorses a 2 year history of heavy EtOH abuse, but charts indicate much further. Labs consistent with cirrhosis (INR 1.15) without signs of acute bleeding risk. Moderate ascites noted in abdomen and pelvis on CT. Paracentesis results consistent with transudative fluid secondary to portal hypertension- SAAG 1.3.  -continue lactulose 20g BID -continue rifaximin  Hyponatremia: Na 133, recently 131-136. Likely in the setting of taking poor PO. Chronic EtOH abuse also likely contributing. Beer potomania vs SIADH vs third spacing vs thiazine use. -serum osm gap is normal at 2.0 -hydrate with NS at , continue this as pt is taking poor PO, consider changing to dextrose  Hypokalemia: Chronic issue for pt. K <3.0 on multiple admissions in the past. K <2.0 in ED on 9/5 and on admission. S/p 18 runs IV K, CMP drawn s/p 16 runs > improved at 3.4. -recheck CMP and consider additional repletion -holding home lasix given low potassium   Hypomagnesemia: Mg 1.5 on admission > 2.1 after 2g Mg 9/6.  -continue to monitor  Alcohol abuse: Pt drinks large amounts of vodka. Last drink 1 day prior to admission. Ethanol level 183 on admission. CIWA 8, 4, 8, 4, 1, 5. Ativan 2mg  x2 given  over past 24 hours.  -stepdown CIWA protocol -social work consult -thiamine, folate, multivitamin  Bipolar disorder and anxiety: Pt with many admissions for IVC and psych management. On prozac 20mg , gabapentin 200mg  TID, topamax 100mg  QD, risperidone 0.5mg  QHS. -continued home meds  Multiple Sclerosis: Pt follows with [facility] neurology.  -continue home symmetrel  Prolonged QTc: QTc 595 on admission today.  -avoid QTc prolonging agents, specifically zofran  FEN/GI: heart healthy diet; PPI Prophylaxis: lovenox  Disposition: continued monitoring in stepdown  Subjective:  Pt very sleepy this morning. [place] and [place] is the 1970s. No pain, arousable.  Objective: Temp:  [98.3 F (36.8 C)-99.3 F (37.4 C)] 98.7 F (37.1 C) (09/08 0315) Pulse Rate:  [102-111] 103 (09/08 0315) Resp:  [16-26] 25 (09/08 0610) BP: (89-120)/(48-71) 102/56 (09/08 0610) SpO2:  [93 %-99 %] 93 % (09/08 0315) Physical Exam: General: Jaundiced female sleeping but arousable.  Cardiovascular: rrr, no m/r/g, 1+ LE edema bilaterally to ankles  Respiratory: CTAB, no crackles or wheezes Abdomen: mildly distended, nontender, + BS, no fluid wave Skin: no rashes, bruising over R upper extremity Neuro: AO only to person.  Laboratory:  Recent Labs Lab 10/15/15 1037 10/16/15 0447 10/17/15 0304  WBC 9.0 8.9 6.1  HGB 13.2 11.3* 10.3*  HCT 38.4 33.5* 30.5*  PLT 170 125* 112*    Recent Labs Lab 10/14/15 2037 10/15/15 1037  10/16/15 0447 10/16/15 1832 10/17/15 0304  NA 131* 133*  < > 131* 128* 131*  K <2.0* <2.0*  < > 2.3* 2.8* 3.4*  CL 81* 81*  < > 86* 89* 95*  CO2 32 33*  < > 28 30 30   BUN <5* <5*  < > <5* <5* <5*  CREATININE 0.48 0.61  < > 0.67 0.63 0.56  CALCIUM 7.9* 8.2*  < > 8.0* 7.9* 7.8*  PROT 6.9 7.0  --   --   --  4.8*  BILITOT 2.7* 2.6*  --   --   --  3.3*  ALKPHOS 224* 216*  --   --   --  163*  ALT 28 30  --   --   --  22  AST 74* 80*  --   --   --  53*   GLUCOSE 88 84  < > 93 106* 86  < > = values in this interval not displayed.  Imaging/Diagnostic Tests: Dg Chest 2 View  Result Date: 10/15/2015 CLINICAL DATA:  Chest pain, shortness of breath, nausea EXAM: CHEST  2 VIEW COMPARISON:  08/22/2015 FINDINGS: Lungs are clear.  No pleural effusion or pneumothorax. The heart is normal in size. Visualized osseous structures are within normal limits. IMPRESSION: Normal chest radiographs. Electronically Signed   By: Charline Bills M.D.   On: 10/15/2015 13:27   Ct Abdomen Pelvis W Contrast  Result Date: 10/15/2015 CLINICAL DATA:  Diffuse abdominal pain, cirrhosis, ascites EXAM: CT ABDOMEN AND PELVIS WITH CONTRAST TECHNIQUE: Multidetector CT imaging of the abdomen and pelvis was performed using the standard protocol following bolus administration of intravenous contrast. CONTRAST:  ISOVUE-300 IOPAMIDOL (ISOVUE-300) INJECTION 61% COMPARISON:  05/18/2015 FINDINGS: The lung bases are unremarkable Small hiatal hernia is noted. Again noted significant fatty infiltration of the liver. Nodular contour of the liver consistent with cirrhosis. Moderate perihepatic and perisplenic ascites. Moderate ascites is noted bilateral paracolic gutters. Moderate ascites mid lower mesentery. Enhanced pancreas is unremarkable. Enhanced spleen is unremarkable. The spleen measures 10.4 cm in length. No adrenal gland mass. Enhanced kidneys are symmetrical in size. No hydronephrosis or hydroureter. Delayed renal images shows bilateral renal symmetrical excretion. Bilateral visualized proximal ureter is unremarkable. No calcified calculi are noted within under distended urinary bladder. There is no small bowel obstruction. No pericecal inflammation. Normal appendix is partially visualized. No distal colonic obstruction. The uterus is atrophic. No adnexal masses noted. Moderate pelvic ascites. Some stool and gas noted within rectum. There is no inguinal adenopathy.  No evidence of free  abdominal air. Sagittal images of the spine shows no destructive bony lesions. Mild degenerative changes lower thoracic spine. No destructive bony lesions are noted within pelvis. IMPRESSION: 1. Again noted subtle nodular contour of the liver consistent with cirrhosis. Significant geographic fatty infiltration of the liver. 2. No hydronephrosis or hydroureter. 3. Abdominal and pelvic ascites is noted. 4. No small bowel obstruction. No pericecal inflammation. Normal appendix partially visualized. 5. No adnexal mass.  Atrophic uterus. Electronically Signed   By: Natasha Mead M.D.   On: 10/15/2015 13:39   US Paracentesis  Result Date: 10/16/2015 INDICATION: History of alcohol abuse and cirrhosis now with symptomatic intra-abdominal ascites. Request for ultrasound-guided paracentesis. EXAM: ULTRASOUND GUIDED RIGHT LATERAL ABDOMEN PARACENTESIS MEDICATIONS: 1% Lidocaine. COMPLICATIONS: None immediate. PROCEDURE: Informed written consent was obtained from the patient after a discussion of the risks, benefits and alternatives to treatment. A timeout was performed prior to the initiation of the procedure. Initial ultrasound scanning demonstrates a moderate amount of ascites within the right lateral abdomen. The right lateral abdomen was prepped and draped in the usual sterile fashion. 1% lidocaine with  epinephrine was used for local anesthesia. Following this, a 19 gauge, 7-cm, Yueh catheter was introduced. An ultrasound image was saved for documentation purposes. The paracentesis was performed. The catheter was removed and a dressing was applied. The patient tolerated the procedure well without immediate post procedural complication. FINDINGS: A total of approximately 3.38 liters of clear yellow fluid was removed. Samples were sent to the laboratory as requested by the clinical team. IMPRESSION: Successful ultrasound-guided paracentesis yielding 3.38 liters of peritoneal fluid. Read by:  Corrin ParkerWendy Blair, PA-C Electronically  Signed   By: Corlis Leak  Hassell M.D.   On: 10/16/2015 14:57    Garth BignessKathryn Zafir Schauer, MD 10/17/2015, 7:27 AM PGY-1, [facility] FPTS Intern pager: [phone], text pages welcome

## 2015-10-17 NOTE — Progress Notes (Signed)
Patient has been very drowsy all night since 1900, I went in to try and give her medication and she would not awake enough to answer my question.  I tried to give her something to drink and she went into a coughing spell afterwards. So I held all medication and contacted the doctor to have her potassium switch to IV and to input a speech evaluation on patient.

## 2015-10-17 NOTE — Progress Notes (Signed)
Patient has had episodes of confusion today. Patient is confused at times about where she currently is and when she arrived. Patient stated, "How did so much happen in this one day?" When patient was told that she has been here more than one day she made a look and said she didn't believe me and that she was at her house last night. Patient has been incontinent of bowel and bladder at times today and this is not patient's home baseline.

## 2015-10-18 LAB — MAGNESIUM: Magnesium: 1.9 mg/dL (ref 1.7–2.4)

## 2015-10-18 LAB — CBC
HCT: 33.7 % — ABNORMAL LOW (ref 36.0–46.0)
Hemoglobin: 11.2 g/dL — ABNORMAL LOW (ref 12.0–15.0)
MCH: 32.5 pg (ref 26.0–34.0)
MCHC: 33.2 g/dL (ref 30.0–36.0)
MCV: 97.7 fL (ref 78.0–100.0)
PLATELETS: 106 10*3/uL — AB (ref 150–400)
RBC: 3.45 MIL/uL — ABNORMAL LOW (ref 3.87–5.11)
RDW: 16.9 % — AB (ref 11.5–15.5)
WBC: 7.4 10*3/uL (ref 4.0–10.5)

## 2015-10-18 LAB — BASIC METABOLIC PANEL
ANION GAP: 6 (ref 5–15)
CALCIUM: 8.3 mg/dL — AB (ref 8.9–10.3)
CO2: 27 mmol/L (ref 22–32)
Chloride: 101 mmol/L (ref 101–111)
Creatinine, Ser: 0.48 mg/dL (ref 0.44–1.00)
GFR calc Af Amer: >60 mL/min (ref >60)
GLUCOSE: 111 mg/dL — AB (ref 65–99)
Potassium: 3.8 mmol/L (ref 3.5–5.1)
Sodium: 134 mmol/L — ABNORMAL LOW (ref 135–145)

## 2015-10-18 LAB — URINE CULTURE: Culture: >=100000 — AB

## 2015-10-18 LAB — PHOSPHORUS: Phosphorus: 1.1 mg/dL — ABNORMAL LOW (ref 2.5–4.6)

## 2015-10-18 MED ORDER — POTASSIUM PHOSPHATES 15 MMOLE/5ML IV SOLN
40.0000 meq | Freq: Once | INTRAVENOUS | Status: AC
  Administered 2015-10-18: 40 meq via INTRAVENOUS
  Filled 2015-10-18: qty 9.09

## 2015-10-18 MED ORDER — MAGNESIUM SULFATE IN D5W 1-5 GM/100ML-% IV SOLN
1.0000 g | Freq: Once | INTRAVENOUS | Status: AC
  Administered 2015-10-18: 1 g via INTRAVENOUS
  Filled 2015-10-18: qty 100

## 2015-10-18 NOTE — Progress Notes (Signed)
Pt's mother came to the nursing station and stated she tripped on pt's blood pressure cord and fell to the floor. Pt states she hit her right hip on the floor but states she is ok. She refuses to go to ED. Safety Zone will be completed. Marisue Ivan RN

## 2015-10-18 NOTE — Progress Notes (Signed)
Family Medicine Teaching Service Daily Progress Note Intern Pager: [phone]  Patient name: Anne ApplebaumCarla S Kidney Medical record number: [id number] Date of birth: 11-Aug-1960 Age: 55 y.o. Gender: female  Primary Care Provider: No PCP Per Patient Consultants: none Code Status: FULL  Pt Overview and Major Events to Date:  9/6: Total of 10 runs of K 9/7 paracentesis for 3.4L, pt tolerated well; pt asking to d/c at 5pm. Waiting on repeat BMP, then pt began hallucinating and K was 2.8.   Assessment and Plan: Anne Black is a 55 y.o. female presenting with nausea x1 week. PMH is significant for alcohol cirrhosis with ascites, alcohol abuse, bipolar disorder, GERD, anxiety, MS.   Alcoholic cirrhosis with ascites: Diagnosed 05/2015. Pt endorses a 2 year history of heavy EtOH abuse, but charts indicate much further. Labs consistent with cirrhosis (INR 1.15) without signs of acute bleeding risk. Moderate ascites noted in abdomen and pelvis on CT. Paracentesis results consistent with transudative fluid secondary to portal hypertension- SAAG 1.3. Abdominal distention stable since paracentesis.  -continue lactulose 20g BID -continue rifaximin  Hyponatremia: Na 134, recently 131-136. Likely in the setting of taking poor PO. Chronic EtOH abuse also likely contributing. Beer potomania vs SIADH vs third spacing vs thiazine use. Serum osm gap is normal at 2.0.  -continue to monitor  Hypokalemia: Chronic issue for pt. K <3.0 on multiple admissions in the past. K <2.0 in ED on 9/5 and on admission. S/p 18 runs IV K, improved to 3.8. -recheck BMP in am -holding home lasix given low potassium   Hypomagnesemia: Mg 1.5 on admission > 2.1 after 2g Mg 9/6.  -continue to monitor  Alcohol abuse: Pt drinks large amounts of vodka. Last drink 1 day prior to admission. Ethanol level 183 on admission. CIWA 4, 4, 13, 2, 0. Ativan 2mg  x1 given over past 24 hours.  -stepdown CIWA protocol -reinforced with nursing that we  would prefer 1mg  Ativan vs 2mg , order changed but protocol still calls for 2mg  for scores 10-19. -social work consult -thiamine, folate, multivitamin  Bipolar disorder and anxiety: Pt with many admissions for IVC and psych management. On prozac 20mg , gabapentin 200mg  TID, topamax 100mg  QD, risperidone 0.5mg  QHS. -continued home meds  Multiple Sclerosis: [facility].  -continue home symmetrel  Prolonged QTc: QTc 595 on admission today.  -avoid QTc prolonging agents, specifically zofran  FEN/GI: heart healthy diet; PPI Prophylaxis: lovenox  Disposition: continued monitoring in stepdown  Subjective:  Pt sleepy but arousable this morning, asking for food and drink. Denies any pain.   Objective: Temp:  [97.4 F (36.3 C)-98.2 F (36.8 C)] 97.7 F (36.5 C) (09/09 0730) Pulse Rate:  [91-103] 91 (09/09 0730) Resp:  [17-24] 18 (09/09 0730) BP: (88-103)/(45-63) 93/57 (09/09 0730) SpO2:  [94 %-100 %] 95 % (09/09 0730) Physical Exam: General: Jaundiced female sleeping but arousable.  Cardiovascular: rrr, no m/r/g, 1+ LE edema bilaterally to ankles  Respiratory: CTAB, no crackles or wheezes Abdomen: mildly distended, nontender, + BS, no fluid wave Skin: no rashes, bruising over R upper extremity Neuro: AOx4.  Laboratory:  Recent Labs Lab 10/16/15 0447 10/17/15 0304 10/18/15 0403  WBC 8.9 6.1 7.4  HGB 11.3* 10.3* 11.2*  HCT 33.5* 30.5* 33.7*  PLT 125* 112* 106*    Recent Labs Lab 10/14/15 2037 10/15/15 1037  10/17/15 0304 10/17/15 1637 10/18/15 0403  NA 131* 133*  < > 131* 133* 134*  K <2.0* <2.0*  < > 3.4* 3.3* 3.8  CL 81*  81*  < > 95* 99* 101  CO2 32 33*  < > 30 28 27   BUN <5* <5*  < > <5* <5* <5*  CREATININE 0.48 0.61  < > 0.56 0.69 0.48  CALCIUM 7.9* 8.2*  < > 7.8* 8.1* 8.3*  PROT 6.9 7.0  --  4.8*  --   --   BILITOT 2.7* 2.6*  --  3.3*  --   --   ALKPHOS 224* 216*  --  163*  --   --   ALT 28 30  --  22  --   --   AST 74* 80*  --  53*   --   --   GLUCOSE 88 84  < > 86 135* 111*  < > = values in this interval not displayed.  Imaging/Diagnostic Tests: US Paracentesis  Result Date: 10/16/2015 INDICATION: History of alcohol abuse and cirrhosis now with symptomatic intra-abdominal ascites. Request for ultrasound-guided paracentesis. EXAM: ULTRASOUND GUIDED RIGHT LATERAL ABDOMEN PARACENTESIS MEDICATIONS: 1% Lidocaine. COMPLICATIONS: None immediate. PROCEDURE: Informed written consent was obtained from the patient after a discussion of the risks, benefits and alternatives to treatment. A timeout was performed prior to the initiation of the procedure. Initial ultrasound scanning demonstrates a moderate amount of ascites within the right lateral abdomen. The right lateral abdomen was prepped and draped in the usual sterile fashion. 1% lidocaine with epinephrine was used for local anesthesia. Following this, a 19 gauge, 7-cm, Yueh catheter was introduced. An ultrasound image was saved for documentation purposes. The paracentesis was performed. The catheter was removed and a dressing was applied. The patient tolerated the procedure well without immediate post procedural complication. FINDINGS: A total of approximately 3.38 liters of clear yellow fluid was removed. Samples were sent to the laboratory as requested by the clinical team. IMPRESSION: Successful ultrasound-guided paracentesis yielding 3.38 liters of peritoneal fluid. Read by:  Corrin Parker, PA-C Electronically Signed   By: Corlis Leak M.D.   On: 10/16/2015 14:57    Garth Bigness, MD 10/18/2015, 8:40 AM PGY-1, [facility] Family Medicine FPTS Intern pager: [phone], text pages welcome

## 2015-10-18 NOTE — Progress Notes (Deleted)
RN unable to get consent from patient or patients family about procedure that is supposed to be happening this morning due to MD not discussing procedure with patient or family yet.

## 2015-10-18 NOTE — Evaluation (Signed)
Physical Therapy Evaluation Patient Details Name: Anne Black MRN: [id number] DOB: 1960/02/19 Today's Date: 10/18/2015   History of Present Illness  Anne Black is a 55 y.o. female presenting with nausea x1 week. PMH is significant for alcohol cirrhosis with ascites, alcohol abuse, bipolar disorder, GERD, anxiety, MS.  Clinical Impression  Pt admitted with above complications. Pt currently with functional limitations due to the deficits listed below (see PT Problem List). Evaluation limited due to symptomatic low blood pressure upon standing. Pt demonstrates instability, reports multiple falls at home PTA. Will continue to monitor and progress with updates to recommendations as appropriate. Pt will benefit from skilled PT to increase their independence and safety with mobility to allow discharge to the venue listed below.       Follow Up Recommendations SNF;Supervision/Assistance - 24 hour    Equipment Recommendations  None recommended by PT    Recommendations for Other Services OT consult     Precautions / Restrictions Precautions Precautions: Fall Precaution Comments: ETOH CIWA protocol Restrictions Weight Bearing Restrictions: No      Mobility  Bed Mobility               General bed mobility comments: in recliner when PT entered room  Transfers Overall transfer level: Needs assistance Equipment used: Rolling walker (2 wheeled) Transfers: Sit to/from Stand Sit to Stand: Min assist         General transfer comment: Min assist for balance with frequent verbal cues for technique to rise scoot and rise from chair. Pt dizzy upon standing (see BP below)  Ambulation/Gait             General Gait Details: Deferred due to symptomatic low BP  Stairs            Wheelchair Mobility    Modified Rankin (Stroke Patients Only)       Balance Overall balance assessment: Needs assistance Sitting-balance support: Bilateral upper extremity  supported Sitting balance-Leahy Scale: Poor Sitting balance - Comments: Unable to tolerate >1 minute due to dizziness and <BP                                     Pertinent Vitals/Pain Pain Assessment: No/denies pain Pain Intervention(s): Monitored during session    Home Living Family/patient expects to be discharged to:: Skilled nursing facility Living Arrangements: Alone   Type of Home: House         Home Equipment: Dan Humphreys - 2 wheels;Cane - single point;Wheelchair - manual      Prior Function Level of Independence: Independent with assistive device(s);Needs assistance   Gait / Transfers Assistance Needed: Pt reports she uses walker in home due to instability.           Hand Dominance        Extremity/Trunk Assessment   Upper Extremity Assessment: Defer to OT evaluation           Lower Extremity Assessment: Generalized weakness;Difficult to assess due to impaired cognition         Communication   Communication: No difficulties  Cognition Arousal/Alertness: Awake/alert Behavior During Therapy: Anxious;Restless Overall Cognitive Status: Within Functional Limits for tasks assessed                      General Comments General comments (skin integrity, edema, etc.): Seated BP 93/54, standing BP 73/61 and symptomatic with dizziness. Once seated again BP 100/61  symptoms resolved. HR maintained 97-102, SpO2 100% on RA    Exercises General Exercises - Lower Extremity Ankle Circles/Pumps: AROM;Both;10 reps;Seated Long Arc Quad: Strengthening;Both;10 reps;Seated Hip Flexion/Marching: Strengthening;Both;10 reps;Seated      Assessment/Plan    PT Assessment Patient needs continued PT services  PT Diagnosis Difficulty walking;Abnormality of gait;Generalized weakness   PT Problem List Decreased strength;Decreased activity tolerance;Decreased balance;Decreased mobility;Decreased coordination;Decreased knowledge of use of DME;Decreased  knowledge of precautions  PT Treatment Interventions DME instruction;Gait training;Functional mobility training;Therapeutic activities;Therapeutic exercise;Balance training;Neuromuscular re-education;Patient/family education   PT Goals (Current goals can be found in the Care Plan section) Acute Rehab PT Goals Patient Stated Goal: none stated PT Goal Formulation: With patient/family Time For Goal Achievement: 11/01/15 Potential to Achieve Goals: Fair    Frequency Min 3X/week   Barriers to discharge Decreased caregiver support lives alone    Co-evaluation               End of Session Equipment Utilized During Treatment: Gait belt Activity Tolerance: Treatment limited secondary to medical complications (Comment) (Low BP) Patient left: in chair;with call bell/phone within reach;with family/visitor present Nurse Communication: Mobility status;Other (comment) (BP)         Time: [id number] PT Time Calculation (min) (ACUTE ONLY): 12 min   Charges:   PT Evaluation $PT Eval Moderate Complexity: 1 Procedure     PT G CodesBerton Mount:        Jaycen Vercher S 10/18/2015, 1:03 PM  Sunday SpillersLogan Secor [place], [place] [phone]

## 2015-10-19 LAB — CBC
HEMATOCRIT: 31.2 % — AB (ref 36.0–46.0)
HEMOGLOBIN: 10.3 g/dL — AB (ref 12.0–15.0)
MCH: 32.3 pg (ref 26.0–34.0)
MCHC: 33 g/dL (ref 30.0–36.0)
MCV: 97.8 fL (ref 78.0–100.0)
Platelets: 114 10*3/uL — ABNORMAL LOW (ref 150–400)
RBC: 3.19 MIL/uL — ABNORMAL LOW (ref 3.87–5.11)
RDW: 17.3 % — AB (ref 11.5–15.5)
WBC: 7 10*3/uL (ref 4.0–10.5)

## 2015-10-19 LAB — BASIC METABOLIC PANEL
Anion gap: 7 (ref 5–15)
BUN: <5 mg/dL — ABNORMAL LOW (ref 6–20)
CHLORIDE: 104 mmol/L (ref 101–111)
CO2: 25 mmol/L (ref 22–32)
Calcium: 8.1 mg/dL — ABNORMAL LOW (ref 8.9–10.3)
Creatinine, Ser: 0.41 mg/dL — ABNORMAL LOW (ref 0.44–1.00)
GFR calc non Af Amer: >60 mL/min (ref >60)
Glucose, Bld: 93 mg/dL (ref 65–99)
POTASSIUM: 3.7 mmol/L (ref 3.5–5.1)
SODIUM: 136 mmol/L (ref 135–145)

## 2015-10-19 LAB — MAGNESIUM: MAGNESIUM: 2.1 mg/dL (ref 1.7–2.4)

## 2015-10-19 LAB — PHOSPHORUS: Phosphorus: 3.3 mg/dL (ref 2.5–4.6)

## 2015-10-19 MED ORDER — FUROSEMIDE 20 MG PO TABS
20.0000 mg | ORAL_TABLET | Freq: Every day | ORAL | Status: DC
  Administered 2015-10-19 – 2015-10-21 (×3): 20 mg via ORAL
  Filled 2015-10-19 (×3): qty 1

## 2015-10-19 MED ORDER — LORAZEPAM 0.5 MG PO TABS
0.5000 mg | ORAL_TABLET | ORAL | Status: DC | PRN
  Administered 2015-10-19 – 2015-10-22 (×8): 0.5 mg via ORAL
  Filled 2015-10-19 (×9): qty 1

## 2015-10-19 MED ORDER — LORAZEPAM 2 MG/ML IJ SOLN: 0.5000 mg | INTRAMUSCULAR | Status: DC | PRN

## 2015-10-19 NOTE — Progress Notes (Signed)
Patient refusing to take Lactulose.  Dr. Wonda Olds notified.

## 2015-10-19 NOTE — Progress Notes (Signed)
Family Medicine Teaching Service Daily Progress Note Intern Pager: [phone]  Patient name: Anne Black Medical record number: [id number] Date of birth: [birth year] Age: 55 y.o. Gender: female  Primary Care Provider: No PCP Per Patient Consultants: none Code Status: FULL  Pt Overview and Major Events to Date:  9/6: Total of 10 runs of K 9/7 paracentesis for 3.4L, pt tolerated well; pt asking to d/c at 5pm. Waiting on repeat BMP, then pt began hallucinating and K was 2.8.   Assessment and Plan: Anne Black is a 55 y.o. female presenting with nausea x1 week. PMH is significant for alcohol cirrhosis with ascites, alcohol abuse, bipolar disorder, GERD, anxiety, MS.   Alcoholic cirrhosis with ascites: Diagnosed 05/2015. Pt endorses a 2 year history of heavy EtOH abuse, but charts indicate much further. Labs consistent with cirrhosis (INR 1.15) without signs of acute bleeding risk. Moderate ascites noted in abdomen and pelvis on CT. Paracentesis results consistent with transudative fluid secondary to portal hypertension- SAAG 1.3. Abdominal distention stable since paracentesis.  -continue lactulose 20g BID -continue rifaximin -continue spironolactone 100 mg daily -Repeat PT assessment for dispo once more alert  Hyponatremia: Resolved. Na 136 9/10. Na 134, recently 131-136. Likely in the setting of taking poor PO. Chronic EtOH abuse also likely contributing. Beer potomania vs SIADH vs third spacing vs thiazine use. Serum osm gap is normal at 2.0.  -continue to monitor  Hypokalemia: Chronic issue for pt. K <3.0 on multiple admissions in the past. K <2.0 in ED on 9/5 and on admission. S/p 18 runs IV K, improved to 3.8. -K 3.7 this am (9/10) -plan to restart home lasix today at half home dose  Hypomagnesemia: Mg 1.5 on admission > 2.1 after 2g Mg 9/6.  -Stable at 2.1 (9/10) -continue to monitor  Alcohol abuse: Pt drinks large amounts of vodka. Last drink 1 day prior to admission.  Ethanol level 183 on admission. Ativan 1mg  x 3 given over past 24 hours. CIWA scores of 0, 0, 5, 14, 16, 0 over last 24 hours with high scores for agitation, tremor and sweating.  -CIWA protocol with prn ativan; if scores remain elevated may need to consider transfer to SDU -social work consult -thiamine, folate, multivitamin  Bipolar disorder and anxiety: Pt with many admissions for IVC and psych management. On prozac 20mg , gabapentin 200mg  TID, topamax 100mg  QD, risperidone 0.5mg  QHS. -continued home meds  Multiple Sclerosis: Pt follows with [facility] neurology.  -continue home symmetrel  Prolonged QTc: QTc 595 on admission.  -avoid QTc prolonging agents, specifically zofran  FEN/GI: heart healthy diet; PPI Prophylaxis: lovenox  Disposition: Home vs. SNF pending improvement in CIWA scores  Subjective:  Pt sleepy but arousable this morning. Speaking in whispered voice and falls back asleep quickly. Denies pain. When asked about agreement to placement at SNF, she said it depends on which one. Telemetry reviewed with a few brief episodes of Vtach.  Objective: Temp:  [97.8 F (36.6 C)-98.9 F (37.2 C)] 98.9 F (37.2 C) (09/10 0733) Pulse Rate:  [89-99] 89 (09/10 0733) Resp:  [18-26] 20 (09/10 0733) BP: (92-103)/(36-52) 92/50 (09/10 0733) SpO2:  [95 %-100 %] 95 % (09/10 0733) FiO2 (%):  [21 %] 21 % (09/09 0859) Weight:  [172 lb 1.6 oz (78.1 kg)] 172 lb 1.6 oz (78.1 kg) (09/09 1423) Physical Exam: General: Jaundiced female sleeping but arousable.  Cardiovascular: rrr, no m/r/g, 1+ LE edema bilaterally to ankles  Respiratory: CTAB, no crackles or wheezes Abdomen: mildly distended, diffusely TTP, +  BS, no fluid wave Skin: no rashes, bruising over R upper extremity Neuro: AOx3. Very sleepy.   Laboratory:  Recent Labs Lab 10/17/15 0304 10/18/15 0403 10/19/15 0517  WBC 6.1 7.4 7.0  HGB 10.3* 11.2* 10.3*  HCT 30.5* 33.7* 31.2*  PLT 112* 106* 114*    Recent Labs Lab  10/14/15 2037 10/15/15 1037  10/17/15 0304 10/17/15 1637 10/18/15 0403 10/19/15 0517  NA 131* 133*  < > 131* 133* 134* 136  K <2.0* <2.0*  < > 3.4* 3.3* 3.8 3.7  CL 81* 81*  < > 95* 99* 101 104  CO2 32 33*  < > 30 28 27 25   BUN <5* <5*  < > <5* <5* <5* <5*  CREATININE 0.48 0.61  < > 0.56 0.69 0.48 0.41*  CALCIUM 7.9* 8.2*  < > 7.8* 8.1* 8.3* 8.1*  PROT 6.9 7.0  --  4.8*  --   --   --   BILITOT 2.7* 2.6*  --  3.3*  --   --   --   ALKPHOS 224* 216*  --  163*  --   --   --   ALT 28 30  --  22  --   --   --   AST 74* 80*  --  53*  --   --   --   GLUCOSE 88 84  < > 86 135* 111* 93  < > = values in this interval not displayed.  Imaging/Diagnostic Tests: No results found.  Casey BurkittHillary Moen Theron Cumbie, MD 10/19/2015, 8:11 AM PGY-2, [facility] FPTS Intern pager: [phone], text pages welcome

## 2015-10-20 LAB — CBC
HEMATOCRIT: 33.2 % — AB (ref 36.0–46.0)
HEMOGLOBIN: 10.9 g/dL — AB (ref 12.0–15.0)
MCH: 32.5 pg (ref 26.0–34.0)
MCHC: 32.8 g/dL (ref 30.0–36.0)
MCV: 99.1 fL (ref 78.0–100.0)
Platelets: 121 10*3/uL — ABNORMAL LOW (ref 150–400)
RBC: 3.35 MIL/uL — AB (ref 3.87–5.11)
RDW: 18.1 % — AB (ref 11.5–15.5)
WBC: 7.8 10*3/uL (ref 4.0–10.5)

## 2015-10-20 LAB — BASIC METABOLIC PANEL
Anion gap: 6 (ref 5–15)
CALCIUM: 8.1 mg/dL — AB (ref 8.9–10.3)
CO2: 23 mmol/L (ref 22–32)
CREATININE: 0.62 mg/dL (ref 0.44–1.00)
Chloride: 106 mmol/L (ref 101–111)
GFR calc Af Amer: >60 mL/min (ref >60)
GLUCOSE: 94 mg/dL (ref 65–99)
Potassium: 3.1 mmol/L — ABNORMAL LOW (ref 3.5–5.1)
Sodium: 135 mmol/L (ref 135–145)

## 2015-10-20 LAB — PHOSPHORUS: Phosphorus: 3.2 mg/dL (ref 2.5–4.6)

## 2015-10-20 LAB — MAGNESIUM: Magnesium: 1.9 mg/dL (ref 1.7–2.4)

## 2015-10-20 NOTE — Progress Notes (Signed)
Family Medicine Teaching Service Daily Progress Note Intern Pager: [phone]  Patient name: Anne Black Medical record number: [id number] Date of birth: 04/27/1960 Age: 55 y.o. Gender: female  Primary Care Provider: No PCP Per Patient Consultants: none Code Status: FULL  Pt Overview and Major Events to Date:  9/6: Total of 10 runs of K 9/7 paracentesis for 3.4L, pt tolerated well; pt asking to d/c at 5pm. Waiting on repeat BMP, then pt began hallucinating and K was 2.8.  9/9: Urine culture with klebsiella growing sens to cefazolin  Assessment and Plan: Anne ApplebaumCarla S Matthew is a 55 y.o. female presenting with nausea x1 week. PMH is significant for alcohol cirrhosis with ascites, alcohol abuse, bipolar disorder, GERD, anxiety, MS.   Alcoholic cirrhosis with ascites: Diagnosed 05/2015. Pt endorses a 2 year history of heavy EtOH abuse, but charts indicate much further. Labs consistent with cirrhosis (INR 1.15) without signs of acute bleeding risk. Moderate ascites noted in abdomen and pelvis on CT on admission. Paracentesis results consistent with transudative fluid secondary to portal hypertension- SAAG 1.3. Increasing abdominal distension today since paracentesis.  -consider ultrasound with concern for recurrent ascites -continue lactulose 20g BID -continue rifaximin -continue spironolactone 100 mg daily -Repeat PT assessment for dispo today  UTI: Urine concerning for UTI on admission, treated with keflex. Culture returned Klebsiella sensitive to cefazolin. Pt afebrile, WBC has never been elevated.  - keflex day 4/7 (end date 10/23/15)  Hyponatremia: Resolved. Na 136 9/10. Likely in the setting of taking poor PO. Chronic EtOH abuse also likely contributing. Serum osm gap is normal at 2.0.  -continue to monitor -labs pending   Hypokalemia: Chronic issue for pt. K <3.0 on multiple admissions in the past. K <2.0 in ED on 9/5 and on admission. S/p 18 runs IV K, improved to 3.8. Restarted home  lasix 9/10.  -K 3.7 (9/10), repeat pending  Hypomagnesemia: Mg 1.5 on admission > 2.1 after 2g Mg 9/6. Repleted and increased to 2.1 on 9/10.  -continue to monitor  Alcohol abuse: Pt drinks large amounts of vodka. Last drink 1 day prior to admission. Ethanol level 183 on admission. Ativan 1mg  x 3 given over past 24 hours. CIWA scores of 6, 10, 9, 0, 0 over last 24 hours with high scores for anxiety, tremor and sweating. Ativan 0.5mg  x 2 given in past 24 hrs. -CIWA protocol with prn ativan -social work consult -thiamine, folate, multivitamin  Bipolar disorder and anxiety: Pt with many admissions for IVC and psych management. On prozac 20mg , gabapentin 200mg  TID, topamax 100mg  QD, risperidone 0.5mg  QHS. -continued home meds  Multiple Sclerosis: Pt follows with [facility] neurology.  -continue home symmetrel  Prolonged QTc: QTc 595 on admission.  -avoid QTc prolonging agents, specifically zofran  FEN/GI: heart healthy diet; PPI Prophylaxis: lovenox  Disposition: Home vs. SNF pending improvement in CIWA scores  Subjective:  Pt awake, sitting up in bed eating this morning. Would like to consider going to a SNF, endorses a desire to remain abstinent from alcohol. Notes diffuse abdominal pain.   Objective: Temp:  [97.5 F (36.4 C)-98.9 F (37.2 C)] 98.5 F (36.9 C) (09/11 14780608) Pulse Rate:  [85-89] 85 (09/11 0608) Resp:  [18-20] 20 (09/11 0608) BP: (91-93)/(50-55) 93/55 (09/11 0608) SpO2:  [95 %-99 %] 99 % (09/11 29560608) Weight:  [168 lb 12.8 oz (76.6 kg)] 168 lb 12.8 oz (76.6 kg) (09/10 2150) Physical Exam: General: Jaundiced female sleeping but arousable.  Cardiovascular: rrr, no m/r/g, 1+ LE edema bilaterally to ankles  Respiratory: CTAB, no crackles or wheezes Abdomen: distended, diffusely TTP, + BS, + fluid wave Skin: no rashes, bruising over R upper extremity Neuro: AOx4.  Laboratory:  Recent Labs Lab 10/17/15 0304 10/18/15 0403 10/19/15 0517  WBC 6.1 7.4 7.0  HGB  10.3* 11.2* 10.3*  HCT 30.5* 33.7* 31.2*  PLT 112* 106* 114*    Recent Labs Lab 10/14/15 2037 10/15/15 1037  10/17/15 0304 10/17/15 1637 10/18/15 0403 10/19/15 0517  NA 131* 133*  < > 131* 133* 134* 136  K <2.0* <2.0*  < > 3.4* 3.3* 3.8 3.7  CL 81* 81*  < > 95* 99* 101 104  CO2 32 33*  < > 30 28 27 25   BUN <5* <5*  < > <5* <5* <5* <5*  CREATININE 0.48 0.61  < > 0.56 0.69 0.48 0.41*  CALCIUM 7.9* 8.2*  < > 7.8* 8.1* 8.3* 8.1*  PROT 6.9 7.0  --  4.8*  --   --   --   BILITOT 2.7* 2.6*  --  3.3*  --   --   --   ALKPHOS 224* 216*  --  163*  --   --   --   ALT 28 30  --  22  --   --   --   AST 74* 80*  --  53*  --   --   --   GLUCOSE 88 84  < > 86 135* 111* 93  < > = values in this interval not displayed.  Imaging/Diagnostic Tests: No results found.  Garth Bigness, MD 10/20/2015, 7:30 AM PGY-1, [facility] Family [facility] FPTS Intern pager: [phone], text pages welcome

## 2015-10-20 NOTE — Progress Notes (Addendum)
Physical Therapy Treatment Patient Details Name: Anne Black MRN: [id number] DOB: 27-Aug-1960 Today's Date: 10/20/2015    History of Present Illness Anne Black is a 55 y.o. female presenting with nausea x1 week. PMH is significant for alcohol cirrhosis with ascites, alcohol abuse, bipolar disorder, GERD, anxiety, MS.    PT Comments    Making progress with functional mobility and activity tolerance; Continue to recommend SNF for rehab to maximize independence and safety with mobility prior to dc to home -- or to Alcohol Rehab facility (which she was not against in our brief conversation); BPs remained low but relatively stable, and Anne Black did state she felt dizziness while getting up; took a few steps to [facility], then stood for hygeine and pivoted to chair; Appreciate Dr. Derek Mound presence in session;   End of session noted wet cough after drinking orange juice; pt states this happens every meal; consider ST consult to evaluate swallow   Follow Up Recommendations  SNF;Supervision/Assistance - 24 hour     Equipment Recommendations  Rolling walker with 5" wheels;3in1 (PT)    Recommendations for Other Services OT consult     Precautions / Restrictions Precautions Precautions: Fall Precaution Comments: ETOH CIWA protocol    Mobility  Bed Mobility Overal bed mobility: Needs Assistance Bed Mobility: Supine to Sit     Supine to sit: Min assist     General bed mobility comments: Cues for technqiue and inititation  Transfers Overall transfer level: Needs assistance Equipment used: Rolling walker (2 wheeled) Transfers: Sit to/from Stand Sit to Stand: Mod assist         General transfer comment: Mod assist to rise to stand as she had a significant posterior lean, bracing backs of LEs against bed for steadiness  Ambulation/Gait Ambulation/Gait assistance: Mod assist;+2 safety/equipment Ambulation Distance (Feet): 6 Feet Assistive device: Rolling walker (2  wheeled) Gait Pattern/deviations: Decreased step length - right;Decreased step length - left;Decreased stance time - right;Decreased stance time - left;Decreased stride length;Shuffle;Leaning posteriorly     General Gait Details: mod assist due to posertior lean and loss of balance   Stairs            Wheelchair Mobility    Modified Rankin (Stroke Patients Only)       Balance Overall balance assessment: Needs assistance   Sitting balance-Leahy Scale: Fair       Standing balance-Leahy Scale: Poor                      Cognition Arousal/Alertness: Awake/alert Behavior During Therapy: WFL for tasks assessed/performed Overall Cognitive Status: Within Functional Limits for tasks assessed (for simple mobility tasks)                      Exercises      General Comments General comments (skin integrity, edema, etc.):   10/20/15 0938 10/20/15 0947 10/20/15 0954  Vital Signs  Patient Position (if appropriate) Orthostatic Vitals --  --   Orthostatic Lying   BP- Lying 90/56 --  --   Pulse- Lying 82 --  --   Orthostatic Sitting  BP- Sitting 96/63, then 59/37 after approx 3 minutes on commode (!) 88/46 (!) 83/58  Pulse- Sitting 90 82 100  Orthostatic Standing at 0 minutes  BP- Standing at 0 minutes 97/44 --  --   Pulse- Standing at 0 minutes 90 --  --      10/20/15 0955  Vital Signs  Patient Position (if appropriate) --  Orthostatic Lying   BP- Lying --   Pulse- Lying --   Orthostatic Sitting  BP- Sitting 103/58  Pulse- Sitting 112 (coughing in recliner, feet up)  Orthostatic Standing at 0 minutes  BP- Standing at 0 minutes --   Pulse- Standing at 0 minutes --          Pertinent Vitals/Pain Pain Assessment: No/denies pain Pain Intervention(s): Monitored during session    Home Living                      Prior Function            PT Goals (current goals can now be found in the care plan section) Acute Rehab PT Goals Patient  Stated Goal: none stated PT Goal Formulation: With patient/family Time For Goal Achievement: 11/01/15 Potential to Achieve Goals: Fair Progress towards PT goals: Progressing toward goals    Frequency  Min 3X/week    PT Plan Current plan remains appropriate    Co-evaluation             End of Session Equipment Utilized During Treatment: Gait belt Activity Tolerance: Patient tolerated treatment well;Other (comment) (soft BPs) Patient left: in chair;with call bell/phone within reach;with chair alarm set     Time: [id number] PT Time Calculation (min) (ACUTE ONLY): 24 min  Charges:  $Gait Training: 8-22 mins $Therapeutic Activity: 8-22 mins                    G Codes:      Anne Black, Anne Black 10/20/2015, 11:16 AM  Anne Black, PT  Acute Rehabilitation Services Pager [phone] Office [phone]

## 2015-10-21 ENCOUNTER — Inpatient Hospital Stay (HOSPITAL_COMMUNITY): Payer: Managed Care, Other (non HMO)

## 2015-10-21 LAB — COMPREHENSIVE METABOLIC PANEL
ALBUMIN: 2.1 g/dL — AB (ref 3.5–5.0)
ALT: 22 U/L (ref 14–54)
AST: 47 U/L — AB (ref 15–41)
Alkaline Phosphatase: 149 U/L — ABNORMAL HIGH (ref 38–126)
Anion gap: 7 (ref 5–15)
CHLORIDE: 105 mmol/L (ref 101–111)
CO2: 24 mmol/L (ref 22–32)
CREATININE: 0.51 mg/dL (ref 0.44–1.00)
Calcium: 8.1 mg/dL — ABNORMAL LOW (ref 8.9–10.3)
GFR calc Af Amer: >60 mL/min (ref >60)
GFR calc non Af Amer: >60 mL/min (ref >60)
GLUCOSE: 95 mg/dL (ref 65–99)
POTASSIUM: 2.9 mmol/L — AB (ref 3.5–5.1)
Sodium: 136 mmol/L (ref 135–145)
Total Bilirubin: 2.4 mg/dL — ABNORMAL HIGH (ref 0.3–1.2)
Total Protein: 4.8 g/dL — ABNORMAL LOW (ref 6.5–8.1)

## 2015-10-21 LAB — BASIC METABOLIC PANEL
ANION GAP: 6 (ref 5–15)
BUN: <5 mg/dL — ABNORMAL LOW (ref 6–20)
CHLORIDE: 108 mmol/L (ref 101–111)
CO2: 22 mmol/L (ref 22–32)
Calcium: 8.3 mg/dL — ABNORMAL LOW (ref 8.9–10.3)
Creatinine, Ser: 0.5 mg/dL (ref 0.44–1.00)
GFR calc Af Amer: >60 mL/min (ref >60)
GFR calc non Af Amer: >60 mL/min (ref >60)
GLUCOSE: 100 mg/dL — AB (ref 65–99)
POTASSIUM: 3.4 mmol/L — AB (ref 3.5–5.1)
Sodium: 136 mmol/L (ref 135–145)

## 2015-10-21 LAB — CULTURE, BODY FLUID W GRAM STAIN -BOTTLE

## 2015-10-21 LAB — CULTURE, BODY FLUID-BOTTLE: CULTURE: NO GROWTH

## 2015-10-21 MED ORDER — FUROSEMIDE 40 MG PO TABS
40.0000 mg | ORAL_TABLET | Freq: Every day | ORAL | Status: DC
  Filled 2015-10-21 (×3): qty 1

## 2015-10-21 MED ORDER — POTASSIUM CHLORIDE 10 MEQ/100ML IV SOLN
10.0000 meq | INTRAVENOUS | Status: AC
  Administered 2015-10-21 (×2): 10 meq via INTRAVENOUS
  Filled 2015-10-21: qty 100

## 2015-10-21 MED ORDER — POTASSIUM CHLORIDE CRYS ER 20 MEQ PO TBCR
40.0000 meq | EXTENDED_RELEASE_TABLET | Freq: Two times a day (BID) | ORAL | Status: DC
  Administered 2015-10-21: 40 meq via ORAL
  Filled 2015-10-21: qty 2

## 2015-10-21 MED ORDER — MAGNESIUM SULFATE IN D5W 1-5 GM/100ML-% IV SOLN
1.0000 g | Freq: Once | INTRAVENOUS | Status: AC
  Administered 2015-10-21: 1 g via INTRAVENOUS
  Filled 2015-10-21: qty 100

## 2015-10-21 MED ORDER — POTASSIUM CHLORIDE 10 MEQ/100ML IV SOLN
INTRAVENOUS | Status: AC
  Filled 2015-10-21: qty 100

## 2015-10-21 MED ORDER — POTASSIUM CHLORIDE 10 MEQ/100ML IV SOLN
10.0000 meq | INTRAVENOUS | Status: AC
  Administered 2015-10-21 (×2): 10 meq via INTRAVENOUS
  Filled 2015-10-21 (×2): qty 100

## 2015-10-21 NOTE — Clinical Social Work Note (Addendum)
CSW talked with patient on 9/11 and 9/12 regarding skilled facility placement. On 9/12 patient reported that she has been to [facility] last month and [place]. CSW reported that her mother does not want to take care of her and she really [place] go stay [place] and this was discussed. CSW expressed concern to patient  regarding this plan due to patient's deconditioning and long travel.    CSW talked again with patient and her ex-husband Claudie LeachReid Cecchi on 9/12 regarding SNF placement and she remains in agreement [place]. Ms. Lorella NimrodHarvey was [place] and the facility search process was discussed. [place] facilities will be [place] and patient will be provided with facility responses.  Genelle BalVanessa Kasandra Fehr, MSW, LCSW Licensed Clinical Social Worker Clinical Social Work Department [facility] [phone]

## 2015-10-21 NOTE — Progress Notes (Signed)
Physical Therapy Treatment Patient Details Name: Anne Black MRN: [id number] DOB: Apr 09, 1960 Today's Date: 10/21/2015    History of Present Illness Anne Black is a 55 y.o. female presenting with nausea x1 week. PMH is significant for alcohol cirrhosis with ascites, alcohol abuse, bipolar disorder, GERD, anxiety, MS.    PT Comments    Able to walk farther tahn last session; still with unsteady, inefficient gait, wide base of support, and decr weight shift fully onto stance leg (R or L); BPs remained low, but stable during session today; Max encouragement to increase activity   Follow Up Recommendations  SNF;Supervision/Assistance - 24 hour     Equipment Recommendations  Rolling walker with 5" wheels;3in1 (PT)    Recommendations for Other Services       Precautions / Restrictions Precautions Precautions: Fall Precaution Comments: ETOH CIWA protocol; BP runs soft    Mobility  Bed Mobility                  Transfers Overall transfer level: Needs assistance Equipment used: None;Rolling walker (2 wheeled) Transfers: Stand Pivot Transfers;Sit to/from Stand Sit to Stand: Mod assist Stand pivot transfers: Min assist       General transfer comment: Min assist with stand pivot transfer to [facility]; Cues for hand placement and safety; light mod assist to power up and steady at initial stand; braces backs of LEs against bed for stability  Ambulation/Gait Ambulation/Gait assistance: Mod assist Ambulation Distance (Feet): 12 Feet Assistive device: Rolling walker (2 wheeled) Gait Pattern/deviations: Wide base of support;Decreased step length - right;Decreased step length - left;Decreased stance time - right;Decreased stance time - left   Gait velocity interpretation: Below normal speed for age/gender (indicative of incr fall risk) General Gait Details: Short, staccato steps with minimal weight shift onto stance leg (R or L) causing decr step length in advancing LE; cues to  self-monitor for activity tolerance   Stairs            Wheelchair Mobility    Modified Rankin (Stroke Patients Only)       Balance     Sitting balance-Leahy Scale: Fair       Standing balance-Leahy Scale: Poor                      Cognition Arousal/Alertness: Awake/alert Behavior During Therapy: WFL for tasks assessed/performed Overall Cognitive Status: Within Functional Limits for tasks assessed (for simple mobility tasks)                      Exercises      General Comments General comments (skin integrity, edema, etc.):  BPs post amb reclined, feet elevated 103/50, HR 86, then 98/52 HR 87      Pertinent Vitals/Pain Pain Assessment: No/denies pain Pain Intervention(s): Monitored during session    Home Living                      Prior Function            PT Goals (current goals can now be found in the care plan section) Acute Rehab PT Goals Patient Stated Goal: none stated PT Goal Formulation: With patient/family Time For Goal Achievement: 11/01/15 Potential to Achieve Goals: Fair Progress towards PT goals: Progressing toward goals    Frequency  Min 3X/week    PT Plan Current plan remains appropriate    Co-evaluation  End of Session Equipment Utilized During Treatment: Gait belt Activity Tolerance: Patient tolerated treatment well Patient left: in chair;with call bell/phone within reach;with chair alarm set     Time: 1345-1411 PT Time Calculation (min) (ACUTE ONLY): 26 min  Charges:  $Gait Training: 8-22 mins $Therapeutic Activity: 8-22 mins                    G Codes:      [name], [name] 10/21/2015, 3:45 PM   [name], [place]  Acute Rehabilitation Services Pager [phone] Office [phone]

## 2015-10-21 NOTE — Progress Notes (Signed)
PT Cancellation Note  Patient Details Name: Anne Black MRN: [id number] DOB: 11-Jan-1961   Cancelled Treatment:    Reason Eval/Treat Not Completed: Patient at procedure or test/unavailable   Currently off the floor, likely at Paracentesis;   Will follow up later today as time allows;  Otherwise, will follow up for PT tomorrow;   Thank you,  Anne ClinesHolly Shalece Black, PT  Acute Rehabilitation Services Pager [phone] Office [phone]     Anne Black, Anne Black [facility] 10/21/2015, 10:07 AM

## 2015-10-21 NOTE — Progress Notes (Signed)
Family Medicine Teaching Service Daily Progress Note Intern Pager: [phone]  Patient name: Anne Black Medical record number: [id number] Date of birth: [birth year] Age: 55 y.o. Gender: female  Primary Care Provider: No PCP Per Patient Consultants: none Code Status: FULL  Pt Overview and Major Events to Date:  9/6: Total of 10 runs of K 9/7 paracentesis for 3.4L, pt tolerated well; pt asking to d/c at 5pm. Waiting on repeat BMP, then pt began hallucinating and K was 2.8.  9/9: Urine culture with klebsiella growing sens to cefazolin 9/12: paracentesis scheduled  Assessment and Plan: Anne ApplebaumCarla S Markell is a 55 y.o. female presenting with nausea x1 week. PMH is significant for alcohol cirrhosis with ascites, alcohol abuse, bipolar disorder, GERD, anxiety, MS.   Alcoholic cirrhosis with ascites: Diagnosed 05/2015. Pt endorses a 2 year history of heavy EtOH abuse, but charts indicate much further. Labs consistent with cirrhosis (INR 1.15) without signs of acute bleeding risk. Moderate ascites noted in abdomen and pelvis on CT on admission. Paracentesis results consistent with transudative fluid secondary to portal hypertension- SAAG 1.3. Increasing abdominal distension today since paracentesis.  -[name] paracentesis scheduled today -consider CXR if worsening SOB after paracentesis, would be c/w with pleural effusion -continue lactulose 20g BID -continue rifaximin -continue spironolactone 100 mg daily  UTI: Urine concerning for UTI on admission, treated with keflex. Culture returned Klebsiella sensitive to cefazolin. Pt afebrile, WBC has never been elevated.  - keflex day 5/7 (end date 10/23/15)  Hyponatremia: Resolved. Na 136 9/12. Likely in the setting of taking poor PO. Chronic EtOH abuse also likely contributing. Serum osm gap is normal at 2.0.  -continue to monitor  Hypokalemia: Chronic issue for pt. K <3.0 on multiple admissions in the past. K <2.0 in ED on 9/5 and on admission. S/p 18 runs  IV K, improved to 3.8. Restarted home lasix 9/10.  -K 2.9, repleting with 40mEQ kdur TID  Hypomagnesemia: Mg 1.5 on admission > 2.1 after 2g Mg 9/6. Repleted and increased to 2.1 on 9/10. Repeat 1.9, will replete again.  -replete today  Alcohol abuse: Pt drinks large amounts of vodka. Last drink 1 day prior to admission. Ethanol level 183 on admission. Ativan 1mg  x 3 given over past 24 hours. CIWA scores of 6, 10, 9, 0, 0 over last 24 hours with high scores for anxiety, tremor and sweating. Ativan 0.5mg  x 2 given in past 24 hrs. -CIWA protocol with prn ativan -social work consult -thiamine, folate, multivitamin  Bipolar disorder and anxiety: Pt with many admissions for IVC and psych management. On prozac 20mg , gabapentin 200mg  TID, topamax 100mg  QD, risperidone 0.5mg  QHS. -continued home meds  Multiple Sclerosis: [facility].  -continue home symmetrel  Prolonged QTc: QTc 595 on admission.  -avoid QTc prolonging agents, specifically zofran  FEN/GI: heart healthy diet; PPI Prophylaxis: lovenox  Disposition: Home vs. SNF pending improvement in CIWA scores  Subjective:  Pt eating in bed. Reports shortness of breath that she attributes to worsening ascites. Would like her vyvanse.   Objective: Temp:  [97.5 F (36.4 C)-98.4 F (36.9 C)] 98.4 F (36.9 C) (09/12 0548) Pulse Rate:  [83-88] 88 (09/12 0548) Resp:  [17-18] 17 (09/12 0548) BP: (81-124)/(35-103) 122/99 (09/12 0548) SpO2:  [87 %-98 %] 96 % (09/12 0548) Physical Exam: General: Jaundiced female sleeping but arousable.  Cardiovascular: rrr, no m/r/g, 1+ LE edema bilaterally to ankles  Respiratory: CTAB, no crackles or wheezes Abdomen: distended, diffusely TTP, + BS, + fluid wave Skin:  no rashes, bruising over R upper extremity Neuro: AOx4.  Laboratory:  Recent Labs Lab 10/18/15 0403 10/19/15 0517 10/20/15 1600  WBC 7.4 7.0 7.8  HGB 11.2* 10.3* 10.9*  HCT 33.7* 31.2* 33.2*  PLT 106* 114*  121*    Recent Labs Lab 10/15/15 1037  10/17/15 0304  10/19/15 0517 10/20/15 1600 10/21/15 0413  NA 133*  < > 131*  < > 136 135 136  K <2.0*  < > 3.4*  < > 3.7 3.1* 2.9*  CL 81*  < > 95*  < > 104 106 105  CO2 33*  < > 30  < > 25 23 24   BUN <5*  < > <5*  < > <5* <5* <5*  CREATININE 0.61  < > 0.56  < > 0.41* 0.62 0.51  CALCIUM 8.2*  < > 7.8*  < > 8.1* 8.1* 8.1*  PROT 7.0  --  4.8*  --   --   --  4.8*  BILITOT 2.6*  --  3.3*  --   --   --  2.4*  ALKPHOS 216*  --  163*  --   --   --  149*  ALT 30  --  22  --   --   --  22  AST 80*  --  53*  --   --   --  47*  GLUCOSE 84  < > 86  < > 93 94 95  < > = values in this interval not displayed.  Imaging/Diagnostic Tests: No results found.  Garth Bigness, MD 10/21/2015, 7:04 AM PGY-1, [facility] Health Family Medicine FPTS Intern pager: [phone], text pages welcome

## 2015-10-21 NOTE — Clinical Social Work Placement (Signed)
   CLINICAL SOCIAL WORK PLACEMENT  NOTE  Date:  10/21/2015  Patient Details  Name: Anne Black MRN: [id number] Date of Birth: 1960-08-31  Clinical Social Work is seeking post-discharge placement for this patient at the Skilled  Nursing Facility level of care (*CSW will initial, date and re-position this form in  chart as items are completed):  Yes   Patient/family provided with [facility] Clinical Social Work Department's list of facilities offering this level of care within the geographic area requested by the patient (or if unable, by the patient's family).  Yes   Patient/family informed of their freedom to choose among providers that offer the needed level of care, that participate in Medicare, Medicaid or managed care program needed by the patient, have an available bed and are willing to accept the patient.  Yes   Patient/family informed of [facility]'s ownership interest in [facility] and [facility], as well as of the fact that they are under no obligation to receive care at these facilities.  PASRR submitted to [employer] on 10/21/15 (MUST SA#[id number])     PASRR number received on       Existing PASRR number confirmed on       FL2 transmitted to all facilities in geographic area requested by pt/family on 10/21/15     FL2 transmitted to all facilities within larger geographic area on       Patient informed that his/her managed care company has contracts with or will negotiate with certain facilities, including the following:            Patient/family informed of bed offers received.  Patient chooses bed at       Physician recommends and patient chooses bed at      Patient to be transferred to   on  .  Patient to be transferred to facility by       Patient family notified on   of transfer.  Name of family member notified:        PHYSICIAN       Additional Comment:    _______________________________________________ Cristobal Goldmann,  LCSW 10/21/2015, 11:57 PM

## 2015-10-21 NOTE — Progress Notes (Signed)
Patient took 20 mg PO lasix per MD order. Patient refused the 40 mg stating that she is not taking anymore today.  Dr. Chanetta Marshall notified.

## 2015-10-21 NOTE — [place] FL2 (Signed)
[place] MEDICAID FL2 LEVEL OF CARE SCREENING TOOL     IDENTIFICATION  Patient Name: Anne Black Birthdate: 1960/03/03 Sex: female Admission Date (Current Location): 10/15/2015  [facility] [place]      Facility and Address:  [facility], [address], [place], [place] [address]      Provider Number: [id number]  Attending Physician Name and Address:  Nestor RampSara L Neal, MD  Relative Name and Phone Number:  Tamera StandsMarjorie Hatley - mother.  Phone [phone]    Current Level of Care: SNF Recommended Level of Care: Nursing Facility Prior Approval Number:    Date Approved/Denied:   PASRR Number:  (Submitted for PASRR 9/12 - MUST MV#[id number])  Discharge Plan: SNF    Current Diagnoses: Patient Active Problem List   Diagnosis Date Noted  . Acute respiratory failure (HCC) 10/15/2015  . Ascites 10/15/2015  . Osteoporosis   . Cirrhosis (HCC)   . Bipolar disorder (HCC)   . Multiple sclerosis (HCC)   . Alcoholic cirrhosis of liver with ascites (HCC) 08/22/2015  . Bipolar disorder, current episode mixed, moderate (HCC) 08/01/2015  . Alcohol use disorder, severe, dependence (HCC) 07/31/2015  . Alcoholic cirrhosis (HCC) 05/31/2015  . Alcoholic hepatitis with ascites 05/31/2015  . Macrocytic anemia- due to alcohol abuse with normal B12 & folate levels 05/31/2015  . Severe protein-calorie malnutrition (HCC) 05/31/2015  . Hypokalemia 05/26/2015  . Encephalopathy, hepatic (HCC) 05/26/2015  . Anxiety 05/18/2015  . Sepsis (HCC) 05/18/2015  . UTI (lower urinary tract infection) 05/18/2015  . Stimulant abuse 12/27/2014  . Nicotine abuse 12/27/2014  . Hyperprolactinemia (HCC) 11/15/2014  . Dyslipidemia   . Noncompliance with therapeutic plan 04/04/2013  . Unspecified hereditary and idiopathic peripheral neuropathy 05/01/2012  . GERD (gastroesophageal reflux disease) 05/01/2012  . Osteoarthrosis, unspecified whether generalized or localized, involving lower leg  05/01/2012  . MS (multiple sclerosis) (HCC) 09/14/2011    Orientation RESPIRATION BLADDER Height & Weight     Self, Time, Situation, Place  Normal Continent Weight: 168 lb 4.8 oz (76.3 kg) Height:  5\' 2"  (157.5 cm)  BEHAVIORAL SYMPTOMS/MOOD NEUROLOGICAL BOWEL NUTRITION STATUS      Continent Diet (Regular)  AMBULATORY STATUS COMMUNICATION OF NEEDS Skin   Extensive Assist Verbally Normal                       Personal Care Assistance Level of Assistance  Bathing, Feeding, Dressing Bathing Assistance: Maximum assistance Feeding assistance: Independent Dressing Assistance: Maximum assistance     Functional Limitations Info  Sight, Hearing, Speech Sight Info: Adequate Hearing Info: Adequate Speech Info: Adequate    SPECIAL CARE FACTORS FREQUENCY  PT (By licensed PT)     PT Frequency: Evaluated 9/9 and a minimum of 3X per week therapy recommended              Contractures Contractures Info: Not present    Additional Factors Info  Code Status, Allergies Code Status Info: Full code Allergies Info: No known allergies           Current Medications (10/21/2015):  This is the current hospital active medication list Current Facility-Administered Medications  Medication Dose Route Frequency Provider Last Rate Last Dose  . albuterol (PROVENTIL) (2.5 MG/3ML) 0.083% nebulizer solution 3 mL  3 mL Inhalation Q6H PRN Arvilla Marketatherine Lauren Wallace, DO      . amantadine (SYMMETREL) capsule 100 mg  100 mg Oral BID Arvilla Marketatherine Lauren Wallace, DO   100 mg at 10/21/15 2159  .  cephALEXin (KEFLEX) capsule 500 mg  500 mg Oral Q12H Arvilla Market, DO   500 mg at 10/21/15 2158  . docusate sodium (COLACE) capsule 100 mg  100 mg Oral Daily PRN Arvilla Market, DO      . enoxaparin (LOVENOX) injection 40 mg  40 mg Subcutaneous Q24H Arvilla Market, DO   40 mg at 10/21/15 1644  . feeding supplement (ENSURE ENLIVE) (ENSURE ENLIVE) liquid 237 mL  237 mL Oral BID BM Nestor Ramp, MD   237 mL at 10/18/15 1000  . FLUoxetine (PROZAC) capsule 20 mg  20 mg Oral Daily Arvilla Market, DO   20 mg at 10/21/15 1127  . folic acid (FOLVITE) tablet 1 mg  1 mg Oral Daily Arvilla Market, DO   1 mg at 10/21/15 1126  . furosemide (LASIX) tablet 40 mg  40 mg Oral Daily Garth Bigness, MD      . gabapentin (NEURONTIN) capsule 200 mg  200 mg Oral TID Arvilla Market, DO   200 mg at 10/21/15 2157  . lactulose (CHRONULAC) 10 GM/15ML solution 20 g  20 g Oral BID Arvilla Market, DO   20 g at 10/21/15 1128  . LORazepam (ATIVAN) injection 0.5 mg  0.5 mg Intravenous Q1H PRN Casey Burkitt, MD      . LORazepam (ATIVAN) tablet 0.5 mg  0.5 mg Oral Q1H PRN Casey Burkitt, MD   0.5 mg at 10/21/15 2208  . MEDLINE mouth rinse  15 mL Mouth Rinse BID Nestor Ramp, MD   15 mL at 10/21/15 2202  . Melatonin TABS 3 mg  3 mg Oral QHS PRN Nestor Ramp, MD   3 mg at 10/18/15 2332  . mometasone-formoterol (DULERA) 200-5 MCG/ACT inhaler 2 puff  2 puff Inhalation BID Arvilla Market, DO   2 puff at 10/21/15 1610  . multivitamin with minerals tablet 1 tablet  1 tablet Oral Daily Arvilla Market, DO   1 tablet at 10/21/15 1123  . pantoprazole (PROTONIX) EC tablet 40 mg  40 mg Oral Daily Arvilla Market, DO   40 mg at 10/21/15 1128  . polyethylene glycol (MIRALAX / GLYCOLAX) packet 17 g  17 g Oral Daily PRN Arvilla Market, DO      . potassium chloride 10 MEQ/100ML IVPB           . promethazine (PHENERGAN) injection 6.25 mg  6.25 mg Intravenous Q6H PRN Nestor Ramp, MD   6.25 mg at 10/21/15 1304  . rifaximin (XIFAXAN) tablet 550 mg  550 mg Oral BID Arvilla Market, DO   550 mg at 10/21/15 2158  . risperiDONE (RISPERDAL) tablet 0.5 mg  0.5 mg Oral QHS Arvilla Market, DO   0.5 mg at 10/21/15 2202  . sodium chloride flush (NS) 0.9 % injection 3 mL  3 mL Intravenous Q12H Arvilla Market, DO   3 mL  at 10/21/15 1128  . spironolactone (ALDACTONE) tablet 100 mg  100 mg Oral Daily Arvilla Market, DO   100 mg at 10/21/15 1126  . thiamine (VITAMIN B-1) tablet 100 mg  100 mg Oral Daily Arvilla Market, DO   100 mg at 10/21/15 1127  . topiramate (TOPAMAX) tablet 100 mg  100 mg Oral Daily Arvilla Market, DO   100 mg at 10/21/15 1123     Discharge Medications: Please see discharge summary for a list of discharge medications.  Relevant Imaging Results:  Relevant Lab Results:   Additional Information [id number]  Okey Dupre Lazaro Arms, LCSW

## 2015-10-22 DIAGNOSIS — R0602 Shortness of breath: Secondary | ICD-10-CM

## 2015-10-22 LAB — BASIC METABOLIC PANEL
ANION GAP: 8 (ref 5–15)
BUN: <5 mg/dL — ABNORMAL LOW (ref 6–20)
CO2: 20 mmol/L — AB (ref 22–32)
Calcium: 8.3 mg/dL — ABNORMAL LOW (ref 8.9–10.3)
Chloride: 108 mmol/L (ref 101–111)
Creatinine, Ser: 0.5 mg/dL (ref 0.44–1.00)
GFR calc Af Amer: >60 mL/min (ref >60)
GFR calc non Af Amer: >60 mL/min (ref >60)
GLUCOSE: 118 mg/dL — AB (ref 65–99)
POTASSIUM: 3.4 mmol/L — AB (ref 3.5–5.1)
Sodium: 136 mmol/L (ref 135–145)

## 2015-10-22 MED ORDER — POTASSIUM CHLORIDE CRYS ER 20 MEQ PO TBCR: 40.0000 meq | EXTENDED_RELEASE_TABLET | Freq: Two times a day (BID) | ORAL | Status: DC

## 2015-10-22 MED ORDER — ONDANSETRON HCL 4 MG PO TABS
4.0000 mg | ORAL_TABLET | Freq: Three times a day (TID) | ORAL | Status: DC | PRN
  Administered 2015-10-22 – 2015-10-23 (×2): 4 mg via ORAL
  Filled 2015-10-22 (×2): qty 1

## 2015-10-22 MED ORDER — ENSURE ENLIVE PO LIQD
237.0000 mL | ORAL | Status: DC
  Administered 2015-10-24: 237 mL via ORAL

## 2015-10-22 MED ORDER — POTASSIUM CHLORIDE CRYS ER 20 MEQ PO TBCR
40.0000 meq | EXTENDED_RELEASE_TABLET | Freq: Two times a day (BID) | ORAL | Status: AC
  Administered 2015-10-22 (×2): 40 meq via ORAL
  Filled 2015-10-22 (×2): qty 2

## 2015-10-22 NOTE — Progress Notes (Signed)
Pt. Admitted to pulling out IV and is becoming more noncompliant with treatment. Placed IV consult and educated patient the reason IV access is necessary.  Site is fine and catheter intact.

## 2015-10-22 NOTE — Progress Notes (Signed)
Nutrition Follow-up  DOCUMENTATION CODES:   Obesity unspecified  INTERVENTION:  Provide Ensure Enlive po once daily, each supplement provides 350 kcal and 20 grams of protein.  Encourage adequate PO intake.   NUTRITION DIAGNOSIS:   Inadequate oral intake related to lethargy/confusion as evidenced by meal completion < 50%; improved  GOAL:   Patient will meet greater than or equal to 90% of their needs; met  MONITOR:   PO intake, Supplement acceptance, Labs, Weight trends, I & O's  REASON FOR ASSESSMENT:   Consult Assessment of nutrition requirement/status  ASSESSMENT:   55 y.o. female presenting with nausea x1 week. PMH is significant for alcohol cirrhosis with ascites, alcohol abuse, bipolar disorder, GERD, anxiety, MS.   Meal completion has been 75-100%. Pt reports having a good appetite with no other difficulties. Pt currently has Ensure ordered with varied consumption. RD to modify orders as intake has improved. Labs and medications reviewed.   Diet Order:  Diet regular Room service appropriate? Yes; Fluid consistency: Thin  Skin:  Reviewed, no issues  Last BM:  9/13  Height:   Ht Readings from Last 1 Encounters:  10/18/15 5' 2" (1.575 m)    Weight:   Wt Readings from Last 1 Encounters:  10/21/15 168 lb 4.8 oz (76.3 kg)    Ideal Body Weight:  50 kg  BMI:  Body mass index is 30.78 kg/m.  Estimated Nutritional Needs:   Kcal:  1600-1800  Protein:  80-100 gm  Fluid:  1.6-1.8 L  EDUCATION NEEDS:   No education needs identified at this time   , MS, RD, LDN Pager # [phone] After hours/ weekend pager # [phone]  

## 2015-10-22 NOTE — Progress Notes (Signed)
Family Medicine Teaching Service Daily Progress Note Intern Pager: [phone]  Patient name: Anne Black Medical record number: [id number] Date of birth: [birth year] Age: 55 y.o. Gender: female  Primary Care Provider: No PCP Per Patient Consultants: none Code Status: FULL  Pt Overview and Major Events to Date:  9/6: Total of 10 runs of K 9/7 paracentesis for 3.4L, pt tolerated well; pt asking to d/c at 5pm. Waiting on repeat BMP, then pt began hallucinating and K was 2.8.  9/9: Urine culture with klebsiella growing sens to cefazolin 9/12: paracentesis ordered > not enough fluid to tap  Assessment and Plan: Anne Black is a 55 y.o. female presenting with nausea x1 week. PMH is significant for alcohol cirrhosis with ascites, alcohol abuse, bipolar disorder, GERD, anxiety, MS.   Alcoholic cirrhosis with ascites: Diagnosed 05/2015. Pt endorses a 2 year history of heavy EtOH abuse, but charts indicate much further. Labs consistent with cirrhosis (INR 1.15) without signs of acute bleeding risk. Moderate ascites noted in abdomen and pelvis on CT on admission. Paracentesis results consistent with transudative fluid secondary to portal hypertension- SAAG 1.3. Increasing abdominal distension today since paracentesis. Pt refused CXR with concerns of shortness of breath, this has resolved today, no new O2 needs.  -continue lactulose 20g BID -on home lasix 40mg  QD -continue rifaximin -continue spironolactone 100 mg daily  UTI: Urine concerning for UTI on admission, treated with keflex. Culture returned Klebsiella sensitive to cefazolin. Pt afebrile, WBC has never been elevated.  - keflex day 6/7 (end date 10/23/15)  Hyponatremia: Resolved. Likely in the setting of taking poor PO. Chronic EtOH abuse also likely contributing. Serum osm gap is normal at 2.0.  -continue to monitor  Hypokalemia: Chronic issue for pt. K <3.0 on multiple admissions in the past. K <2.0 in ED on 9/5 and on admission. S/p  18 runs IV K, improved to 3.8. Restarted home lasix 9/10.  -K 3.4 today, will replete with 40meQ BID.   Hypomagnesemia: Mg 1.5 on admission > 2.1 after 2g Mg 9/6. Repleted and increased to 2.1 on 9/10. Repeat 1.9, will replete again.  -continue to monitor  Alcohol abuse: Pt drinks large amounts of vodka. Last drink 1 day prior to admission. Ethanol level 183 on admission. Ativan 1mg  x 3 given over past 24 hours. CIWA scores of 1, 3, 4, 0, 0 over last 24 hours with high scores for anxiety, tremor and sweating. -CIWA protocol with prn ativan -social work consult -thiamine, folate, multivitamin  Bipolar disorder and anxiety: Pt with many admissions for IVC and psych management. On prozac 20mg , gabapentin 200mg  TID, topamax 100mg  QD, risperidone 0.5mg  QHS. -continued home meds  Multiple Sclerosis: [facility].  -continue home symmetrel  Prolonged QTc: QTc 595 on admission.  -avoid QTc prolonging agents, specifically zofran  FEN/GI: heart healthy diet; PPI Prophylaxis: lovenox  Disposition: awaiting SNF placement per PT  Subjective:  Pt eating in bed. Reports vague anxiety.   Objective: Temp:  [98.5 F (36.9 C)-98.7 F (37.1 C)] 98.5 F (36.9 C) (09/13 0554) Pulse Rate:  [84-89] 84 (09/13 0554) Resp:  [16-18] 16 (09/13 0554) BP: (85-122)/(58-77) 85/70 (09/13 0554) SpO2:  [97 %-99 %] 98 % (09/13 0744) Weight:  [168 lb 4.8 oz (76.3 kg)] 168 lb 4.8 oz (76.3 kg) (09/12 2026) Physical Exam: General: Jaundiced female sleeping but arousable.  Cardiovascular: rrr, no m/r/g, 1+ LE edema bilaterally to ankles  Respiratory: CTAB, no crackles or wheezes Abdomen: mildly distended, but soft Skin:  no rashes, bruising over R upper extremity Neuro: AOx4.  Laboratory:  Recent Labs Lab 10/18/15 0403 10/19/15 0517 10/20/15 1600  WBC 7.4 7.0 7.8  HGB 11.2* 10.3* 10.9*  HCT 33.7* 31.2* 33.2*  PLT 106* 114* 121*    Recent Labs Lab 10/15/15 1037   10/17/15 0304  10/21/15 0413 10/21/15 1548 10/22/15 0655  NA 133*  < > 131*  < > 136 136 136  K <2.0*  < > 3.4*  < > 2.9* 3.4* 3.4*  CL 81*  < > 95*  < > 105 108 108  CO2 33*  < > 30  < > 24 22 20*  BUN <5*  < > <5*  < > <5* <5* <5*  CREATININE 0.61  < > 0.56  < > 0.51 0.50 0.50  CALCIUM 8.2*  < > 7.8*  < > 8.1* 8.3* 8.3*  PROT 7.0  --  4.8*  --  4.8*  --   --   BILITOT 2.6*  --  3.3*  --  2.4*  --   --   ALKPHOS 216*  --  163*  --  149*  --   --   ALT 30  --  22  --  22  --   --   AST 80*  --  53*  --  47*  --   --   GLUCOSE 84  < > 86  < > 95 100* 118*  < > = values in this interval not displayed.  Imaging/Diagnostic Tests: US Abdomen Limited  Result Date: 10/21/2015 CLINICAL DATA:  Ascites, history alcoholic cirrhosis, multiple sclerosis, GERD EXAM: LIMITED ABDOMEN ULTRASOUND FOR ASCITES TECHNIQUE: Limited ultrasound survey for ascites was performed in all four abdominal quadrants. COMPARISON:  10/16/2015 FINDINGS: Survey imaging the abdomen demonstrates a small amount of ascites between bowel loops in the RIGHT lower quadrant. No significant pocket of ascites identified. IMPRESSION: No significant ascites identified. Electronically Signed   By: Ulyses Southward M.D.   On: 10/21/2015 10:28    Garth Bigness, MD 10/22/2015, 9:38 AM PGY-1, [facility] FPTS Intern pager: [phone], text pages welcome

## 2015-10-22 NOTE — Progress Notes (Signed)
Pt. mother wanted note for her daughter stay because she had a cruise that she was not able to make because of patients hospitalization. The note is for the refund from the vacation insurance.

## 2015-10-23 DIAGNOSIS — R0600 Dyspnea, unspecified: Secondary | ICD-10-CM

## 2015-10-23 DIAGNOSIS — J9601 Acute respiratory failure with hypoxia: Secondary | ICD-10-CM

## 2015-10-23 LAB — BASIC METABOLIC PANEL
Anion gap: 4 — ABNORMAL LOW (ref 5–15)
CALCIUM: 8.5 mg/dL — AB (ref 8.9–10.3)
CO2: 22 mmol/L (ref 22–32)
CREATININE: 0.51 mg/dL (ref 0.44–1.00)
Chloride: 111 mmol/L (ref 101–111)
GFR calc Af Amer: >60 mL/min (ref >60)
Glucose, Bld: 108 mg/dL — ABNORMAL HIGH (ref 65–99)
POTASSIUM: 3.8 mmol/L (ref 3.5–5.1)
SODIUM: 137 mmol/L (ref 135–145)

## 2015-10-23 MED ORDER — NICOTINE 21 MG/24HR TD PT24
21.0000 mg | MEDICATED_PATCH | Freq: Every day | TRANSDERMAL | Status: DC
  Administered 2015-10-23 – 2015-10-24 (×2): 21 mg via TRANSDERMAL
  Filled 2015-10-23 (×2): qty 1

## 2015-10-23 MED ORDER — ACETAMINOPHEN 500 MG PO TABS
500.0000 mg | ORAL_TABLET | Freq: Four times a day (QID) | ORAL | Status: DC | PRN
  Administered 2015-10-23 – 2015-10-24 (×2): 500 mg via ORAL
  Filled 2015-10-23 (×2): qty 1

## 2015-10-23 NOTE — Evaluation (Signed)
Occupational Therapy Evaluation Patient Details Name: Anne ApplebaumCarla S Kem MRN: [id number] DOB: [birth year] Today's Date: 10/23/2015    History of Present Illness Anne ApplebaumCarla S Dolberry is a 55 y.o. female presenting with nausea x1 week. PMH is significant for alcohol cirrhosis with ascites, alcohol abuse, bipolar disorder, GERD, anxiety, MS.   Clinical Impression   Pt admitted with nausea. Pt currently with functional limitations due to the deficits listed below (see OT Problem List).  Pt will benefit from skilled OT to increase their safety and independence with ADL and functional mobility for ADL to facilitate discharge to venue listed below.      Follow Up Recommendations  SNF    Equipment Recommendations  None recommended by OT       Precautions / Restrictions Precautions Precautions: Fall Precaution Comments: ETOH CIWA protocol; BP runs soft Restrictions Weight Bearing Restrictions: No      Mobility Bed Mobility Overal bed mobility: Needs Assistance Bed Mobility: Supine to Sit     Supine to sit: Min assist     General bed mobility comments: Cues for technqiue and inititation  Transfers Overall transfer level: Needs assistance Equipment used: Rolling walker (2 wheeled) Transfers: Sit to/from [employer]Stand;Stand Pivot Transfers Sit to Stand: Mod assist Stand pivot transfers: Mod assist                 ADL Overall ADL's : Needs assistance/impaired Eating/Feeding: Set up;Sitting   Grooming: Set up;Sitting   Upper Body Bathing: Minimal assitance;Sitting   Lower Body Bathing: Moderate assistance;Sit to/from stand;Cueing for sequencing;Cueing for compensatory techniques   Upper Body Dressing : Minimal assistance;Sitting   Lower Body Dressing: Moderate assistance;Sit to/from stand;Cueing for sequencing;Cueing for safety   Toilet Transfer: Moderate assistance;BSC;RW   Toileting- Clothing Manipulation and Hygiene: Minimal assistance                          Pertinent Vitals/Pain Pain Assessment: 0-10 Pain Score: 4  Pain Location: sacrum Pain Descriptors / Indicators: Tender Pain Intervention(s): Monitored during session     Hand Dominance     Extremity/Trunk Assessment Upper Extremity Assessment Upper Extremity Assessment: Generalized weakness           Communication Communication Communication: No difficulties   Cognition Arousal/Alertness: Awake/alert Behavior During Therapy: WFL for tasks assessed/performed Overall Cognitive Status: Within Functional Limits for tasks assessed (for simple mobility tasks)                                Home Living Family/patient expects to be discharged to:: Skilled nursing facility Living Arrangements: Alone   Type of Home: House                       Home Equipment: Dan HumphreysWalker - 2 wheels;Cane - single point;Wheelchair - manual          Prior Functioning/Environment Level of Independence: Independent with assistive device(s);Needs assistance  Gait / Transfers Assistance Needed: Pt reports she uses walker in home due to instability.          OT Diagnosis: Generalized weakness   OT Problem List: Decreased strength;Decreased activity tolerance;Decreased safety awareness   OT Treatment/Interventions: Self-care/ADL training;DME and/or AE instruction;Patient/family education    OT Goals(Current goals can be found in the care plan section) Acute Rehab OT Goals Patient Stated Goal: get to a rehab place OT Goal Formulation: With patient Time For Goal Achievement: 11/06/15  Potential to Achieve Goals: Good  OT Frequency: Min 2X/week   Barriers to D/C:               End of Session Equipment Utilized During Treatment: Rolling walker Nurse Communication: Mobility status  Activity Tolerance: Patient tolerated treatment well Patient left: in chair   Time: 1211-1229 OT Time Calculation (min): 18 min Charges:  OT General Charges $OT Visit: 1 Procedure OT  Evaluation $OT Eval Moderate Complexity: 1 Procedure G-Codes:    Alba Cory Nov 03, 2015, 12:36 PM

## 2015-10-23 NOTE — Clinical Social Work Note (Signed)
Awaiting PASSR # and bed offers.  CSW will continue to follow for discharge needs.  Valero EnergyShonny Cerina Leary, LCSW [phone]

## 2015-10-23 NOTE — Progress Notes (Signed)
Paged intern pager at this time for patient concerns

## 2015-10-23 NOTE — Progress Notes (Signed)
Family Medicine Teaching Service Daily Progress Note Intern Pager: [phone]  Patient name: Anne Black Medical record number: [id number] Date of birth: October 14, 1960 Age: 55 y.o. Gender: female  Primary Care Provider: No PCP Per Patient Consultants: none Code Status: FULL  Pt Overview and Major Events to Date:  9/6: Total of 10 runs of K 9/7 paracentesis for 3.4L, pt tolerated well; pt asking to d/c at 5pm. Waiting on repeat BMP, then pt began hallucinating and K was 2.8.  9/9: Urine culture with klebsiella growing sens to cefazolin 9/12: paracentesis ordered > not enough fluid to tap  Assessment and Plan: Anne Black is a 55 y.o. female presenting with nausea x1 week. PMH is significant for alcohol cirrhosis with ascites, alcohol abuse, bipolar disorder, GERD, anxiety, MS.   Alcoholic cirrhosis with ascites: Diagnosed 05/2015. Pt endorses a 2 year history of heavy EtOH abuse, but charts indicate much further. Labs consistent with cirrhosis (INR 1.15) without signs of acute bleeding risk. Moderate ascites noted in abdomen and pelvis on CT on admission. Paracentesis results consistent with transudative fluid secondary to portal hypertension- SAAG 1.3. Increasing abdominal distension today since paracentesis. Pt refused CXR with concerns of shortness of breath, this has resolved today, no new O2 needs.  -continue lactulose 20g BID -on home lasix 40mg  QD -continue rifaximin -continue spironolactone 100 mg daily  UTI: Urine concerning for UTI on admission, treated with keflex. Culture returned Klebsiella sensitive to cefazolin. Pt afebrile, WBC has never been elevated.  - keflex day 7/7   Hyponatremia: Resolved. Likely in the setting of taking poor PO. Chronic EtOH abuse also likely contributing. Serum osm gap is normal at 2.0.  -continue to monitor  Hypokalemia: Chronic issue for pt. K <3.0 on multiple admissions in the past. K <2.0 in ED on 9/5 and on admission. S/p 18 runs IV K,  improved to 3.8. Restarted home lasix 9/10.  -K 3.8, will d/c with QD  Hypomagnesemia: Mg 1.5 on admission > 2.1 after 2g Mg 9/6. Repleted and increased to 2.1 on 9/10. Repeat 1.9, will replete again.  -continue to monitor  Alcohol abuse: Pt drinks large amounts of vodka. Last drink 1 day prior to admission. Ethanol level 183 on admission. Ativan 1mg  x 3 given over past 24 hours. CIWA scores of 1, 3, 4, 0, 0 over last 24 hours with high scores for anxiety, tremor and sweating. -CIWA protocol with prn ativan -thiamine, folate, multivitamin  Bipolar disorder and anxiety: Pt with many admissions for IVC and psych management. On prozac 20mg , gabapentin 200mg  TID, topamax 100mg  QD, risperidone 0.5mg  QHS. -continued home meds  Multiple Sclerosis: Pt follows with [facility] neurology.  -continue home symmetrel  Prolonged QTc: QTc 595 on admission.  -avoid QTc prolonging agents, specifically zofran  FEN/GI: heart healthy diet; PPI Prophylaxis: lovenox  Disposition: awaiting SNF placement per PT  Subjective:  Pt eating in bed. Reports vague anxiety. Her mother is present, had a great conversation about the importance of rehab.  Objective: Temp:  [97.9 F (36.6 C)-98.6 F (37 C)] 97.9 F (36.6 C) (09/14 0438) Pulse Rate:  [80-95] 80 (09/14 0438) Resp:  [16-20] 16 (09/14 0438) BP: (94-100)/(56-78) 94/56 (09/14 0438) SpO2:  [96 %-98 %] 97 % (09/14 0910) Physical Exam: General: Jaundiced female sleeping but arousable.  Cardiovascular: rrr, no m/r/g, 1+ LE edema bilaterally to ankles  Respiratory: CTAB, no crackles or wheezes Abdomen: mildly distended, but soft Skin: no rashes, bruising over R upper extremity Neuro: AOx4.  Laboratory:  Recent Labs Lab 10/18/15 0403 10/19/15 0517 10/20/15 1600  WBC 7.4 7.0 7.8  HGB 11.2* 10.3* 10.9*  HCT 33.7* 31.2* 33.2*  PLT 106* 114* 121*    Recent Labs Lab 10/17/15 0304  10/21/15 0413 10/21/15 1548 10/22/15 0655 10/23/15 1001   NA 131*  < > 136 136 136 137  K 3.4*  < > 2.9* 3.4* 3.4* 3.8  CL 95*  < > 105 108 108 111  CO2 30  < > 24 22 20* 22  BUN <5*  < > <5* <5* <5* <5*  CREATININE 0.56  < > 0.51 0.50 0.50 0.51  CALCIUM 7.8*  < > 8.1* 8.3* 8.3* 8.5*  PROT 4.8*  --  4.8*  --   --   --   BILITOT 3.3*  --  2.4*  --   --   --   ALKPHOS 163*  --  149*  --   --   --   ALT 22  --  22  --   --   --   AST 53*  --  47*  --   --   --   GLUCOSE 86  < > 95 100* 118* 108*  < > = values in this interval not displayed.  Imaging/Diagnostic Tests: No results found.  Garth BignessKathryn Orell Hurtado, MD 10/23/2015, 12:40 PM PGY-1, [facility] FPTS Intern pager: [phone], text pages welcome

## 2015-10-23 NOTE — Progress Notes (Signed)
Physical Therapy Treatment Patient Details Name: Anne Black MRN: [id number] DOB: October 20, 1960 Today's Date: 10/23/2015    History of Present Illness Anne Black is a 55 y.o. female presenting with nausea x1 week. PMH is significant for alcohol cirrhosis with ascites, alcohol abuse, bipolar disorder, GERD, anxiety, MS.    PT Comments    Patient making good gains with mobility and gait today.  Follow Up Recommendations  SNF;Supervision/Assistance - 24 hour     Equipment Recommendations  Rolling walker with 5" wheels;3in1 (PT)    Recommendations for Other Services       Precautions / Restrictions Precautions Precautions: Fall Precaution Comments: ETOH CIWA protocol; BP runs soft Restrictions Weight Bearing Restrictions: No    Mobility  Bed Mobility Overal bed mobility: Needs Assistance Bed Mobility: Supine to Sit     Supine to sit: Min assist     General bed mobility comments: Patient sitting EOB as PT entered room  Transfers Overall transfer level: Needs assistance Equipment used: Rolling walker (2 wheeled) Transfers: Sit to/from Stand Sit to Stand: Min guard Stand pivot transfers: Mod assist       General transfer comment: Verbal cues for hand placement.  Assist for safety/balance.  Patient able to move to stance with no physical assist.  Ambulation/Gait Ambulation/Gait assistance: Min guard Ambulation Distance (Feet): 300 Feet Assistive device: Rolling walker (2 wheeled) Gait Pattern/deviations: Step-through pattern;Decreased stride length Gait velocity: decreased Gait velocity interpretation: Below normal speed for age/gender General Gait Details: Verbal cues for safe use of RW.  Short, shuffling steps.  Increased time for mobility.  Improved activity tolerance.   Stairs            Wheelchair Mobility    Modified Rankin (Stroke Patients Only)       Balance           Standing balance support: Single extremity supported Standing  balance-Leahy Scale: Poor                      Cognition Arousal/Alertness: Awake/alert Behavior During Therapy: WFL for tasks assessed/performed Overall Cognitive Status: Within Functional Limits for tasks assessed                      Exercises      General Comments        Pertinent Vitals/Pain Pain Assessment: No/denies pain Pain Score: 4  Pain Location: sacrum Pain Descriptors / Indicators: Tender Pain Intervention(s): Monitored during session    Home Living Family/patient expects to be discharged to:: Skilled nursing facility Living Arrangements: Alone   Type of Home: House       Home Equipment: Dan Humphreys - 2 wheels;Cane - single point;Wheelchair - manual      Prior Function Level of Independence: Independent with assistive device(s);Needs assistance  Gait / Transfers Assistance Needed: Pt reports she uses walker in home due to instability.       PT Goals (current goals can now be found in the care plan section) Acute Rehab PT Goals Patient Stated Goal: get to a rehab place Progress towards PT goals: Progressing toward goals    Frequency  Min 3X/week    PT Plan Current plan remains appropriate    Co-evaluation             End of Session Equipment Utilized During Treatment: Gait belt Activity Tolerance: Patient tolerated treatment well Patient left: in chair;with call bell/phone within reach     Time: 1343-1400 PT Time Calculation (  min) (ACUTE ONLY): 17 min  Charges:  $Gait Training: 8-22 mins                    G Codes:      Vena AustriaDavis, Destynee Stringfellow H 10/23/2015, 2:07 PM Durenda HurtSusan H. Renaldo Fiddleravis, PT, [facility] Acute Rehab Services Pager [phone]

## 2015-10-24 LAB — BASIC METABOLIC PANEL
ANION GAP: 5 (ref 5–15)
BUN: <5 mg/dL — ABNORMAL LOW (ref 6–20)
CALCIUM: 8.4 mg/dL — AB (ref 8.9–10.3)
CO2: 24 mmol/L (ref 22–32)
CREATININE: 0.44 mg/dL (ref 0.44–1.00)
Chloride: 109 mmol/L (ref 101–111)
GLUCOSE: 90 mg/dL (ref 65–99)
Potassium: 3.8 mmol/L (ref 3.5–5.1)
Sodium: 138 mmol/L (ref 135–145)

## 2015-10-24 LAB — MAGNESIUM: MAGNESIUM: 1.8 mg/dL (ref 1.7–2.4)

## 2015-10-24 MED ORDER — MELATONIN 3 MG PO TABS: 3.0000 mg | ORAL_TABLET | Freq: Every evening | ORAL | 0 refills | Status: DC | PRN

## 2015-10-24 MED ORDER — MAGNESIUM SULFATE 2 GM/50ML IV SOLN
2.0000 g | Freq: Every day | INTRAVENOUS | Status: DC
  Administered 2015-10-24: 2 g via INTRAVENOUS
  Filled 2015-10-24: qty 50

## 2015-10-24 MED ORDER — FUROSEMIDE 40 MG PO TABS: 40.0000 mg | ORAL_TABLET | Freq: Every day | ORAL | 0 refills | Status: DC

## 2015-10-24 MED ORDER — ENSURE ENLIVE PO LIQD: 237.0000 mL | ORAL | 12 refills | Status: DC

## 2015-10-24 MED ORDER — NICOTINE 21 MG/24HR TD PT24: 21.0000 mg | MEDICATED_PATCH | Freq: Every day | TRANSDERMAL | 0 refills | Status: DC

## 2015-10-24 MED ORDER — BACLOFEN 10 MG PO TABS: 10.0000 mg | ORAL_TABLET | Freq: Three times a day (TID) | ORAL | 0 refills | Status: DC | PRN

## 2015-10-24 MED ORDER — MICONAZOLE NITRATE 100 MG VA SUPP: 100.0000 mg | Freq: Every day | VAGINAL | 0 refills | Status: DC

## 2015-10-24 MED ORDER — MICONAZOLE NITRATE 100 MG VA SUPP
100.0000 mg | Freq: Every day | VAGINAL | Status: DC
  Administered 2015-10-24: 100 mg via VAGINAL
  Filled 2015-10-24: qty 7

## 2015-10-24 MED ORDER — MICONAZOLE NITRATE 100 MG VA SUPP: 100.0000 mg | Freq: Every day | VAGINAL | Status: DC

## 2015-10-24 NOTE — Clinical Social Work Note (Signed)
CSW notified RNCM that patient does not have any bed offers and is walking 372ft. CSW called attending in AM and also brought up concerns about placing patient with SNF.   [employer], LCSW [phone]

## 2015-10-24 NOTE — Care Management Note (Signed)
Case Management Note  Patient Details  Name: Anne Black MRN: [id number] Date of Birth: 1960-08-06  Subjective/Objective:      CM following for progression and d/c planning.               Action/Plan: 10/24/2015 This CM informed by CSW Rana Snare that this pt is walking 300 ft per PT notes on 10/23/2015 and OOB with minimal assistance. The pt no longer qualifies for SNF based on PT needs additionally there have been no bed offers at this time for the pt . Given this information the pt may d/c to home with [facility] services. This CM along with CSW [place] met with the pt mother, Anne Black to explain prior to informing pt as Anne Black supports and assist her daughter, this pt as much as possible. Anne Black became very upset insisting that pt insurance [employer] would cover SNF cost even without a demonstrated skilled need.  This CM ask Nathaniel Man, English as a second language teacher of CSW to explain to Anne Black that this pt has no bed offers in their attempt to place the pt in skilled care.  Family Medicine resident Dr Lindell Noe in to speak with Anne Black as well. PT again saw the pt and eval pt to be at her baseline.  This CM arranged HH with [facility] and requested a visit as early as possible following d/c. They will provide [facility] visit as early as 'Sunday, Sept 17, 2017. 3:1 commode ordered.    Expected Discharge Date:     09' /15/2017             Expected Discharge Plan:  [place]  In-House Referral:  Clinical Social Work  Discharge planning Services  CM Consult  Post Acute Care Choice:  Home Health, Durable Medical Equipment Choice offered to:  NA  DME Arranged:  3-N-1 DME Agency:   [facility]  HH Arranged:  RN, PT, OT HH Agency:  [place]  Status of Service:  Completed, signed off  If discussed at [place] of Stay Meetings, dates discussed:    Additional Comments:  Adron Bene, RN 10/24/2015, 5:37 PM

## 2015-10-24 NOTE — Progress Notes (Signed)
10/24/2015 11:15 AM (Late Entry)  Patient complaining of vaginal irration to NT [place] and stated "I think I have a yeast infection".   Informed patient that the MD was aware and that they had already ordered her medication for her yeast infection. Check eMAR. Patient stated that she did not wish to wait until night time to get the mediation due to her "wanting to dig my vagina out now" from scratching so much.   Informed MD again. New orders written. Check eMAR. Will continue to assess and monitor the patient.   [employer], RN-BC, [employer] [facility] Phone [phone]

## 2015-10-24 NOTE — Progress Notes (Signed)
Physical Therapy Treatment Patient Details Name: Anne Black MRN: [id number] DOB: [birth year] Today's Date: 10/24/2015    History of Present Illness Anne Black is a 55 y.o. female presenting with nausea x1 week. PMH is significant for alcohol cirrhosis with ascites, alcohol abuse, bipolar disorder, GERD, anxiety, MS.    PT Comments    Pt admitted with above diagnosis. Pt currently with functional limitations due to balance and endurance deficits. Upon arrival, pt was on 3N1 and had urinated on floor.  Changed pts socks and cleaned her up.  Pt min guard assist overall with some decr safety awareness mostly due to anxiety.  Pt was able to ambulate with RW in hall with good safety.  This is most likely pt's baseline status. Noted that SNF would not accept pt therefore recommend HHPT, HHOT, HHaide and 24 hour supervision.  Pt will benefit from skilled PT to increase their independence and safety with mobility to allow discharge to the venue listed below.    Follow Up Recommendations  Supervision/Assistance - 24 hour;Home health PT (HHOT, HHaide)     Equipment Recommendations  3in1 (PT)    Recommendations for Other Services       Precautions / Restrictions Precautions Precautions: Fall Precaution Comments: ETOH CIWA protocol; BP runs soft Restrictions Weight Bearing Restrictions: No    Mobility  Bed Mobility               General bed mobility comments: Patient sitting on 3N1 as PT entered room.  Urine on floor.  Pt did not make it to 3N1 prior to urinating.  This PT cleaned pt, changed her socks, and then pt walked.  Transfers Overall transfer level: Needs assistance Equipment used: Rolling walker (2 wheeled) Transfers: Sit to/from Stand Sit to Stand: Min guard         General transfer comment: Verbal cues for hand placement.  Patient able to move to stance with no physical assist.  PT cleaned pts bottom and pt was able to wash her perineal and buttock area.  Pt  shaky as she was highly anxious.   Ambulation/Gait Ambulation/Gait assistance: Min guard Ambulation Distance (Feet): 45 Feet Assistive device: Rolling walker (2 wheeled) Gait Pattern/deviations: Step-through pattern;Decreased stride length;Shuffle Gait velocity: decreased Gait velocity interpretation: Below normal speed for age/gender General Gait Details: Verbal cues for safe use of RW.  Short, shuffling steps.  Increased time for mobility. Pt would not walk far because she was "nervous."   Stairs            Wheelchair Mobility    Modified Rankin (Stroke Patients Only)       Balance Overall balance assessment: Needs assistance Sitting-balance support: No upper extremity supported;Feet supported Sitting balance-Leahy Scale: Fair     Standing balance support: Bilateral upper extremity supported;During functional activity Standing balance-Leahy Scale: Poor Standing balance comment: relies on RW for UE support             High level balance activites: Direction changes;Turns High Level Balance Comments: min guard    Cognition Arousal/Alertness: Awake/alert Behavior During Therapy: Anxious;Restless Overall Cognitive Status: Impaired/Different from baseline Area of Impairment: Following commands;Safety/judgement;Awareness       Following Commands: Follows one step commands with increased time Safety/Judgement: Decreased awareness of safety Awareness: Intellectual   General Comments: Pt highly anxious.  Limited safety.  Probable baseline for pt.     Exercises      General Comments General comments (skin integrity, edema, etc.): Mom upset that pt is  being discharged.  Pt is most likely at baseline.  CM came and discussed that pt will be followed in clinic and [facility].        Pertinent Vitals/Pain Pain Assessment: No/denies pain  VSS    Home Living                      Prior Function            PT Goals (current goals can now  be found in the care plan section) Progress towards PT goals: Progressing toward goals    Frequency    Min 3X/week      PT Plan Discharge plan needs to be updated    Co-evaluation             End of Session Equipment Utilized During Treatment: Gait belt Activity Tolerance: Patient tolerated treatment well Patient left: in chair;with call bell/phone within reach;with family/visitor present     Time: 1547-1610 PT Time Calculation (min) (ACUTE ONLY): 23 min  Charges:  $Gait Training: 8-22 mins $Self Care/Home Management: 8-22                    G CodesBerline Lopes:      Vonne Mcdanel F 10/24/2015, 4:45 PM Syndi Pua,PT Acute Rehabilitation [phone] (pager)

## 2015-10-24 NOTE — Progress Notes (Signed)
10/24/2015 12:45 PM (Late Entry)  Patient expressed concern about what her plans were for discharge. I informed her that I would speak with CM and SW. She stated that she would rather have them come in and talk with her.   Her main concerns were 1) what SNF was her bed offer with? 2) What if she did not like that SNF that was chosen for her, what would be her options for choosing another facility.   Informed CM. Will continue to assess and monitor the patient.   PACCAR Incyanne Hill BSN, RN-BC, [employer] [facility] Phone [phone]

## 2015-10-24 NOTE — Discharge Instructions (Signed)
It would be the best thing you could do for your health if you could stop drinking alcohol and smoking. You have made great strides by detoxing from alcohol already! Please follow up with our clinic on 9/26 at 10:45, arrive by 10:30am.   BE SURE that you take your lasix and spironolactone at home. These help keep your potassium at the appropriate level.   Go back to the emergency room if you feel like you have accumulated a lot of fluid in your belly.

## 2015-10-24 NOTE — Progress Notes (Signed)
Patient discharged to home. Discharge instruction were given to patient, patient agreed and verbalized understanding. Patient left unit via wheelchair accompanied my her mother. No further questions at this moment.  Anne Black, Anne Black n 10/24/15

## 2015-10-24 NOTE — Progress Notes (Signed)
~  1600 Called to bedside with Anne Black, CSW ChiropodistAssistant Director to discuss disposition with patient, mother and patients daughter.   Patients mother concerned that per her up until today the patient was planning to be discharged to SNF after the weekend. Mother verbalized that patient is not able to care for herself at home and that the mother is not able to care for the patient at home.    Discussed that patient would be unable to go to SNF due to improvement with PT and not having been given any bed offers to SNF.   Patient and Mother provided with a list of private duty care agencies. Also discussed that per CM [facility] and [facility] and request a visit ASAP.   Paged and spoke with Dr. Chanetta Marshallimberlake and requested that she come to speak with patient and Mother in room. Dr. Chanetta Marshallimberlake arrived and listened to mother's concerns and discussed discharge disposition options.   ~1700 text paged Dr. Chanetta Marshallimberlake to make aware that PTs most recent note indicated if patient could not go to SNF that they recommend HHPT, HHOT, HHAid.  Anne Black, Anne Black

## 2015-10-24 NOTE — Progress Notes (Signed)
Family Medicine Teaching Service Daily Progress Note Intern Pager: [phone]  Patient name: Anne Black Medical record number: [id number] Date of birth: Sep 11, 1960 Age: 55 y.o. Gender: female  Primary Care Provider: No PCP Per Patient Consultants: none Code Status: FULL  Pt Overview and Major Events to Date:  9/6: Total of 10 runs of K 9/7 paracentesis for 3.4L, pt tolerated well; pt asking to d/c at 5pm. Waiting on repeat BMP, then pt began hallucinating and K was 2.8.  9/9: Urine culture with klebsiella growing sens to cefazolin 9/12: paracentesis ordered > not enough fluid to tap  Assessment and Plan: Anne Black is a 55 y.o. female presenting with nausea x1 week. PMH is significant for alcohol cirrhosis with ascites, alcohol abuse, bipolar disorder, GERD, anxiety, MS.   Alcoholic cirrhosis with ascites: Diagnosed 05/2015. Pt endorses a 2 year history of heavy EtOH abuse, but charts indicate much further. Labs consistent with cirrhosis (INR 1.15) without signs of acute bleeding risk. Moderate ascites noted in abdomen and pelvis on CT on admission. Paracentesis results consistent with transudative fluid secondary to portal hypertension- SAAG 1.3. Increasing abdominal distension today since paracentesis. Pt refused CXR with concerns of shortness of breath, this has resolved today, no new O2 needs.  -continue lactulose 20g BID -on home lasix 40mg  QD -continue rifaximin -continue spironolactone 100 mg daily  UTI: Urine concerning for UTI on admission, treated with keflex. Culture returned Klebsiella sensitive to cefazolin. Pt afebrile, WBC has never been elevated.  - keflex course completed  Hyponatremia: Resolved. Likely in the setting of taking poor PO. Chronic EtOH abuse also likely contributing. Serum osm gap is normal at 2.0.  -continue to monitor  Hypokalemia, resolved: Chronic issue for pt. K <3.0 on multiple admissions in the past. K <2.0 in ED on 9/5 and on admission. S/p  18 runs IV K, improved to 3.8. Restarted home lasix 9/10.  -K 3.8 stable, consider d/c with kdur vs outpatient monitoring  Hypomagnesemia: Mg 1.5 on admission > 2.1 after 2g Mg 9/6. Repleted and increased to 2.1 on 9/10. Repeat 1.9, will replete again.  -will replete again 9/15  Alcohol abuse: Pt drinks large amounts of vodka. Last drink 1 day prior to admission. Ethanol level 183 on admission. Ativan 1mg  x 3 given over past 24 hours. CIWA scores of 1, 3, 4, 0, 0 over last 24 hours with high scores for anxiety, tremor and sweating. -CIWA protocol with prn ativan -thiamine, folate, multivitamin  Bipolar disorder and anxiety: Pt with many admissions for IVC and psych management. On prozac 20mg , gabapentin 200mg  TID, topamax 100mg  QD, risperidone 0.5mg  QHS. -continued home meds  Multiple Sclerosis: [facility].  -continue home symmetrel  Prolonged QTc: QTc 595 on admission.  -avoid QTc prolonging agents, specifically zofran  FEN/GI: heart healthy diet; PPI Prophylaxis: lovenox  Disposition: awaiting SNF placement per PT  Subjective:  Pt sitting up in bed. Bringing up various concerns of needing long term pain regimen. Wanted her gabapentin changed to PRN, wanted fioricet, and norco. Will disuss this with the team. Also concerned with yeast infection and hair loss. Told her that we will recommend B12 followup with her outpatient doctor.  Objective: Temp:  [98 F (36.7 C)-98.7 F (37.1 C)] 98.1 F (36.7 C) (09/15 0924) Pulse Rate:  [80-86] 85 (09/15 0924) Resp:  [16-18] 18 (09/15 0924) BP: (96-113)/(52-66) 97/52 (09/15 0924) SpO2:  [97 %-99 %] 98 % (09/15 0924) Physical Exam: General: Overweight female sitting up in  bed. Cardiovascular: rrr, no m/r/g, 1+ LE edema bilaterally to ankles  Respiratory: CTAB, no crackles or wheezes Abdomen: mildly distended, but soft Skin: no rashes, bruising over R upper extremity Neuro: AOx4.  Laboratory:  Recent  Labs Lab 10/18/15 0403 10/19/15 0517 10/20/15 1600  WBC 7.4 7.0 7.8  HGB 11.2* 10.3* 10.9*  HCT 33.7* 31.2* 33.2*  PLT 106* 114* 121*    Recent Labs Lab 10/21/15 0413  10/22/15 0655 10/23/15 1001 10/24/15 0611  NA 136  < > 136 137 138  K 2.9*  < > 3.4* 3.8 3.8  CL 105  < > 108 111 109  CO2 24  < > 20* 22 24  BUN <5*  < > <5* <5* <5*  CREATININE 0.51  < > 0.50 0.51 0.44  CALCIUM 8.1*  < > 8.3* 8.5* 8.4*  PROT 4.8*  --   --   --   --   BILITOT 2.4*  --   --   --   --   ALKPHOS 149*  --   --   --   --   ALT 22  --   --   --   --   AST 47*  --   --   --   --   GLUCOSE 95  < > 118* 108* 90  < > = values in this interval not displayed.  Imaging/Diagnostic Tests: No results found.  Garth Bigness, MD 10/24/2015, 9:51 AM PGY-1, [facility] FPTS Intern pager: [phone], text pages welcome

## 2015-10-24 NOTE — Progress Notes (Signed)
Called to bedside to discuss disposition with pt and mother. Had a long discussion with pt's mother at 1400 today about disposition, including CM Cheryl. Mother had been told that pt would be unable to go to SNF because no facilities had given bed offers, and in addition pt has improved too much with PT. Per our earlier discussion, we had PT re-evaluate the patient, but based on insurance no longer paying for hospital stay and no SNF offers, we would probably have to discharge the pt.   In this discussion, pt and mother were visibly upset that they felt like we were being forced out. Heard concerns and voiced understanding and empathy. Nurse and nurse floor manager were present. Explained that we were limited by insurance and PT recs. We will take patient on as an outpatient and coordinate her care and [facility] services.

## 2015-10-24 NOTE — Clinical Social Work Note (Signed)
CSW received a call from Dr. Chanetta Marshallimberlake. CSW expressed concerns about not receiving bed offers for patient and patient walking 36500ft. CSW expressed concerns that patient is  no longer appropriate  for SNF placement. CSW made [facility] and ChiropodistAssistant Director of Social Work aware of patient.  [employer]ero EnergyShonny Nanea Jared, LCSW [phone]

## 2015-10-30 ENCOUNTER — Telehealth: Payer: Self-pay | Admitting: General Practice

## 2015-10-30 NOTE — Telephone Encounter (Signed)
Pts mother Tamera Stands, discussed with Dr Chanetta Marshall about completing forms for vacation insurance. Dr Chanetta Marshall agreed. Pt has appt 11-04-15. When the forms are complete, please call Ms Josepha Pigg to pick. Do not give them to pt.

## 2015-11-02 NOTE — Progress Notes (Signed)
   [facility] Phone: [phone]   Date of Visit: 11/04/2015   HPI:  Patient is here for hospital follow up. She was hospitalized in 10/15/15 to 10/24/15. Patient presented with nausea x1 week without vomiting. She was noncompliant with her medication regimen for alcoholic cirrhosis. She had some electrolyte disturbances on admission. She was admitted for monitoring of alcohol withdrawal and therapeutic/diagnostic paracentesis. The fluid was found to be transudative in nature. She was also treated for UTI and vaginal yeast infection.   - reports she is drinking again: 1 pint vodka a day since discharge. Reports that she has cravings. She has a 5 yr history of alcohol abuse.  - she did go to AA twice after discharge: reports this was very helpful  - mother reports she has not been taking medications; reports when she is drinking she is not "with it" - reports her abdomen feels more full since discharge, reports of bloating not pain, no fevers  - her east infection and UTI resolved  - reports of bowel incontience intermittently since being diagnosed; mother reports this occurs when she takes lactulose. - has home health nurse who has come once and PT  - mother is concerned that patient is unable to care for herself at home appropriately. She reports that she will continue to drink if she is at home and will not take her medications.  - she still smoking; not using nicotine patches   - reports not taking any medications she was discharged with including Lactulose and Lasix  - [place] note[place]  ROS: See HPI.  PMFSH:  PMH: Alcoholic Cirrhosis GERD Hyperprolactinemia MS Chronic Peripheral Neuropathy Osteoporosis  OA unspecified  HLD Stimulant abuse/nicotine abuse  Bipolar DO, Anxiety   PHYSICAL EXAM: BP (!) 123/53   Pulse 84   Temp 98.4 F (36.9 C) (Oral)   Wt 164 lb (74.4 kg)   SpO2 98%   BMI 30.00 kg/m  GEN: NAD, non-toxic appearing    HEENT: EOMI, sclera clear  CV: RRR, no murmurs, rubs, or gallops, no JVD PULM: CTAB, normal effort ABD: Distended, fluid wave present, tender to palpation diffusely but possibly more on the left side, no guarding/rebound, NABS SKIN: No rash or cyanosis; warm and well-perfused EXTR: pitting edema up to mid shin of bilateral LE.   PSYCH: Mood and affect euthymic, normal rate and volume of speech NEURO: Awake, alert, no focal deficits grossly, normal speech, No asterixis   ASSESSMENT/PLAN:  Patient presents with abdominal ascites in the setting of alcoholic cirrhosis that has worsened. She has diffuse pain on palpation of her abdomen, but no fevers. She may need repeat paracentesis. Additionally, patient has been non-compliant with her medication regimen for her cirrhosis. Her blood pressure is soft while she has been off her medications. I am afraid that restarting her meds may bottom her out. Additionally, I do not think the patient is able to take care of herself at home despite [facility]. She also began to drink again. Discussed with Dr. Randolm IdolFletke. It is best that she is readmitted for possible repeat paracentesis, to re-initiate spiro and lasix while closely monitoring her BP, and also find a safe discharge plan for patient.   [name], MD PGY 2 [facility]

## 2015-11-04 ENCOUNTER — Ambulatory Visit (INDEPENDENT_AMBULATORY_CARE_PROVIDER_SITE_OTHER): Payer: Managed Care, Other (non HMO) | Admitting: Internal Medicine

## 2015-11-04 ENCOUNTER — Encounter (HOSPITAL_COMMUNITY): Payer: Self-pay | Admitting: General Practice

## 2015-11-04 ENCOUNTER — Encounter: Payer: Self-pay | Admitting: Internal Medicine

## 2015-11-04 ENCOUNTER — Inpatient Hospital Stay (HOSPITAL_COMMUNITY)
Admission: AD | Admit: 2015-11-04 | Discharge: 2015-11-05 | DRG: 434 | Disposition: A | Discharge status: Home Health | Payer: Managed Care, Other (non HMO) | Source: Ambulatory Visit | Attending: Family Medicine | Admitting: Family Medicine

## 2015-11-04 ENCOUNTER — Other Ambulatory Visit: Payer: Self-pay

## 2015-11-04 VITALS — BP 123/53 | HR 84 | Temp 98.4°F | Wt 164.0 lb

## 2015-11-04 DIAGNOSIS — Z8249 Family history of ischemic heart disease and other diseases of the circulatory system: Secondary | ICD-10-CM | POA: Diagnosis not present

## 2015-11-04 DIAGNOSIS — R140 Abdominal distension (gaseous): Secondary | ICD-10-CM | POA: Diagnosis not present

## 2015-11-04 DIAGNOSIS — F319 Bipolar disorder, unspecified: Secondary | ICD-10-CM | POA: Diagnosis present

## 2015-11-04 DIAGNOSIS — T500X6A Underdosing of mineralocorticoids and their antagonists, initial encounter: Secondary | ICD-10-CM | POA: Diagnosis present

## 2015-11-04 DIAGNOSIS — K219 Gastro-esophageal reflux disease without esophagitis: Secondary | ICD-10-CM | POA: Diagnosis present

## 2015-11-04 DIAGNOSIS — M810 Age-related osteoporosis without current pathological fracture: Secondary | ICD-10-CM | POA: Diagnosis present

## 2015-11-04 DIAGNOSIS — Y92009 Unspecified place in unspecified non-institutional (private) residence as the place of occurrence of the external cause: Secondary | ICD-10-CM | POA: Diagnosis not present

## 2015-11-04 DIAGNOSIS — T501X6A Underdosing of loop [high-ceiling] diuretics, initial encounter: Secondary | ICD-10-CM | POA: Diagnosis present

## 2015-11-04 DIAGNOSIS — G35 Multiple sclerosis: Secondary | ICD-10-CM | POA: Diagnosis present

## 2015-11-04 DIAGNOSIS — T473X6A Underdosing of saline and osmotic laxatives, initial encounter: Secondary | ICD-10-CM | POA: Diagnosis present

## 2015-11-04 DIAGNOSIS — Z23 Encounter for immunization: Secondary | ICD-10-CM | POA: Diagnosis not present

## 2015-11-04 DIAGNOSIS — I4581 Long QT syndrome: Secondary | ICD-10-CM | POA: Diagnosis present

## 2015-11-04 DIAGNOSIS — Z79899 Other long term (current) drug therapy: Secondary | ICD-10-CM

## 2015-11-04 DIAGNOSIS — F17210 Nicotine dependence, cigarettes, uncomplicated: Secondary | ICD-10-CM | POA: Diagnosis present

## 2015-11-04 DIAGNOSIS — F419 Anxiety disorder, unspecified: Secondary | ICD-10-CM | POA: Diagnosis present

## 2015-11-04 DIAGNOSIS — F1010 Alcohol abuse, uncomplicated: Secondary | ICD-10-CM

## 2015-11-04 DIAGNOSIS — K7031 Alcoholic cirrhosis of liver with ascites: Secondary | ICD-10-CM | POA: Diagnosis present

## 2015-11-04 DIAGNOSIS — F1020 Alcohol dependence, uncomplicated: Secondary | ICD-10-CM | POA: Diagnosis present

## 2015-11-04 DIAGNOSIS — G43909 Migraine, unspecified, not intractable, without status migrainosus: Secondary | ICD-10-CM | POA: Diagnosis present

## 2015-11-04 DIAGNOSIS — E876 Hypokalemia: Secondary | ICD-10-CM | POA: Diagnosis present

## 2015-11-04 LAB — MRSA PCR SCREENING: MRSA BY PCR: NEGATIVE

## 2015-11-04 LAB — COMPREHENSIVE METABOLIC PANEL
ALBUMIN: 2.8 g/dL — AB (ref 3.5–5.0)
ALT: 24 U/L (ref 14–54)
ANION GAP: 15 (ref 5–15)
AST: 55 U/L — ABNORMAL HIGH (ref 15–41)
Alkaline Phosphatase: 119 U/L (ref 38–126)
BUN: <5 mg/dL — ABNORMAL LOW (ref 6–20)
CALCIUM: 8.3 mg/dL — AB (ref 8.9–10.3)
CHLORIDE: 102 mmol/L (ref 101–111)
CO2: 23 mmol/L (ref 22–32)
Creatinine, Ser: 0.38 mg/dL — ABNORMAL LOW (ref 0.44–1.00)
GFR calc non Af Amer: >60 mL/min (ref >60)
GLUCOSE: 74 mg/dL (ref 65–99)
POTASSIUM: 2.9 mmol/L — AB (ref 3.5–5.1)
Sodium: 140 mmol/L (ref 135–145)
Total Bilirubin: 2 mg/dL — ABNORMAL HIGH (ref 0.3–1.2)
Total Protein: 6.1 g/dL — ABNORMAL LOW (ref 6.5–8.1)

## 2015-11-04 LAB — PROTIME-INR
INR: 1.28
PROTHROMBIN TIME: 16.1 s — AB (ref 11.4–15.2)

## 2015-11-04 LAB — LACTATE DEHYDROGENASE: LDH: 214 U/L — AB (ref 98–192)

## 2015-11-04 LAB — CBC
HEMATOCRIT: 36.2 % (ref 36.0–46.0)
HEMOGLOBIN: 11.8 g/dL — AB (ref 12.0–15.0)
MCH: 31.9 pg (ref 26.0–34.0)
MCHC: 32.6 g/dL (ref 30.0–36.0)
MCV: 97.8 fL (ref 78.0–100.0)
Platelets: 247 10*3/uL (ref 150–400)
RBC: 3.7 MIL/uL — ABNORMAL LOW (ref 3.87–5.11)
RDW: 16.1 % — ABNORMAL HIGH (ref 11.5–15.5)
WBC: 11.5 10*3/uL — AB (ref 4.0–10.5)

## 2015-11-04 LAB — MAGNESIUM: Magnesium: 1.6 mg/dL — ABNORMAL LOW (ref 1.7–2.4)

## 2015-11-04 LAB — ETHANOL: ALCOHOL ETHYL (B): 56 mg/dL — AB (ref <5)

## 2015-11-04 LAB — AMMONIA: Ammonia: 50 umol/L — ABNORMAL HIGH (ref 9–35)

## 2015-11-04 MED ORDER — SPIRONOLACTONE 25 MG PO TABS
100.0000 mg | ORAL_TABLET | Freq: Every day | ORAL | Status: DC
  Administered 2015-11-04 – 2015-11-05 (×2): 100 mg via ORAL
  Filled 2015-11-04 (×3): qty 4

## 2015-11-04 MED ORDER — CHLORDIAZEPOXIDE HCL 25 MG PO CAPS
25.0000 mg | ORAL_CAPSULE | Freq: Four times a day (QID) | ORAL | Status: DC
  Filled 2015-11-04: qty 1

## 2015-11-04 MED ORDER — CHLORDIAZEPOXIDE HCL 25 MG PO CAPS: 25.0000 mg | ORAL_CAPSULE | ORAL | Status: DC

## 2015-11-04 MED ORDER — RIFAXIMIN 550 MG PO TABS
550.0000 mg | ORAL_TABLET | Freq: Two times a day (BID) | ORAL | Status: DC
  Administered 2015-11-04 – 2015-11-05 (×2): 550 mg via ORAL
  Filled 2015-11-04 (×2): qty 1

## 2015-11-04 MED ORDER — ENOXAPARIN SODIUM 40 MG/0.4ML ~~LOC~~ SOLN
40.0000 mg | SUBCUTANEOUS | Status: DC
  Administered 2015-11-04: 40 mg via SUBCUTANEOUS
  Filled 2015-11-04 (×2): qty 0.4

## 2015-11-04 MED ORDER — AMANTADINE HCL 100 MG PO CAPS
100.0000 mg | ORAL_CAPSULE | Freq: Two times a day (BID) | ORAL | Status: DC
  Administered 2015-11-04 – 2015-11-05 (×2): 100 mg via ORAL
  Filled 2015-11-04 (×3): qty 1

## 2015-11-04 MED ORDER — LORAZEPAM 1 MG PO TABS
1.0000 mg | ORAL_TABLET | Freq: Four times a day (QID) | ORAL | Status: DC | PRN
  Administered 2015-11-05: 1 mg via ORAL
  Filled 2015-11-04: qty 1

## 2015-11-04 MED ORDER — THIAMINE HCL 100 MG/ML IJ SOLN
100.0000 mg | Freq: Every day | INTRAMUSCULAR | Status: DC
  Filled 2015-11-04: qty 2

## 2015-11-04 MED ORDER — NICOTINE 21 MG/24HR TD PT24
21.0000 mg | MEDICATED_PATCH | Freq: Every day | TRANSDERMAL | Status: DC
  Administered 2015-11-04 – 2015-11-05 (×2): 21 mg via TRANSDERMAL
  Filled 2015-11-04 (×2): qty 1

## 2015-11-04 MED ORDER — CHLORDIAZEPOXIDE HCL 25 MG PO CAPS: 25.0000 mg | ORAL_CAPSULE | Freq: Three times a day (TID) | ORAL | Status: DC

## 2015-11-04 MED ORDER — LOPERAMIDE HCL 2 MG PO CAPS: 2.0000 mg | ORAL_CAPSULE | ORAL | Status: DC | PRN

## 2015-11-04 MED ORDER — POTASSIUM CHLORIDE CRYS ER 20 MEQ PO TBCR
40.0000 meq | EXTENDED_RELEASE_TABLET | ORAL | Status: AC
  Administered 2015-11-04 – 2015-11-05 (×3): 40 meq via ORAL
  Filled 2015-11-04 (×3): qty 2

## 2015-11-04 MED ORDER — CHLORDIAZEPOXIDE HCL 25 MG PO CAPS
25.0000 mg | ORAL_CAPSULE | Freq: Four times a day (QID) | ORAL | Status: DC
  Administered 2015-11-04 – 2015-11-05 (×2): 25 mg via ORAL
  Filled 2015-11-04 (×2): qty 1

## 2015-11-04 MED ORDER — CHLORDIAZEPOXIDE HCL 25 MG PO CAPS
50.0000 mg | ORAL_CAPSULE | Freq: Once | ORAL | Status: AC
Start: 2015-11-04 — End: 2015-11-04
  Administered 2015-11-04: 50 mg via ORAL
  Filled 2015-11-04: qty 2

## 2015-11-04 MED ORDER — FOLIC ACID 1 MG PO TABS
1.0000 mg | ORAL_TABLET | Freq: Every day | ORAL | Status: DC
  Administered 2015-11-04 – 2015-11-05 (×2): 1 mg via ORAL
  Filled 2015-11-04 (×2): qty 1

## 2015-11-04 MED ORDER — FUROSEMIDE 40 MG PO TABS
40.0000 mg | ORAL_TABLET | Freq: Every day | ORAL | Status: DC
  Administered 2015-11-04 – 2015-11-05 (×2): 40 mg via ORAL
  Filled 2015-11-04 (×2): qty 1

## 2015-11-04 MED ORDER — KETOROLAC TROMETHAMINE 30 MG/ML IJ SOLN
30.0000 mg | Freq: Four times a day (QID) | INTRAMUSCULAR | Status: DC | PRN
  Administered 2015-11-05: 30 mg via INTRAVENOUS
  Filled 2015-11-04: qty 1

## 2015-11-04 MED ORDER — MAGNESIUM SULFATE 2 GM/50ML IV SOLN
2.0000 g | Freq: Every day | INTRAVENOUS | Status: DC
  Administered 2015-11-04 – 2015-11-05 (×2): 2 g via INTRAVENOUS
  Filled 2015-11-04 (×2): qty 50

## 2015-11-04 MED ORDER — LACTULOSE 10 GM/15ML PO SOLN
20.0000 g | Freq: Two times a day (BID) | ORAL | Status: DC
  Filled 2015-11-04 (×2): qty 30

## 2015-11-04 MED ORDER — MOMETASONE FURO-FORMOTEROL FUM 200-5 MCG/ACT IN AERO
2.0000 | INHALATION_SPRAY | Freq: Two times a day (BID) | RESPIRATORY_TRACT | Status: DC
  Administered 2015-11-05: 2 via RESPIRATORY_TRACT
  Filled 2015-11-04: qty 8.8

## 2015-11-04 MED ORDER — POLYETHYLENE GLYCOL 3350 17 G PO PACK: 17.0000 g | PACK | Freq: Every day | ORAL | Status: DC | PRN

## 2015-11-04 MED ORDER — PROMETHAZINE HCL 25 MG PO TABS
12.5000 mg | ORAL_TABLET | Freq: Four times a day (QID) | ORAL | Status: DC | PRN
  Administered 2015-11-04: 12.5 mg via ORAL
  Filled 2015-11-04: qty 1

## 2015-11-04 MED ORDER — CHLORDIAZEPOXIDE HCL 25 MG PO CAPS: 25.0000 mg | ORAL_CAPSULE | Freq: Every day | ORAL | Status: DC

## 2015-11-04 MED ORDER — FLUOXETINE HCL 20 MG PO CAPS
20.0000 mg | ORAL_CAPSULE | Freq: Every day | ORAL | Status: DC
  Administered 2015-11-04 – 2015-11-05 (×2): 20 mg via ORAL
  Filled 2015-11-04 (×2): qty 1

## 2015-11-04 MED ORDER — MELATONIN 3 MG PO TABS
3.0000 mg | ORAL_TABLET | Freq: Every evening | ORAL | Status: DC | PRN
  Filled 2015-11-04: qty 1

## 2015-11-04 MED ORDER — ADULT MULTIVITAMIN W/MINERALS CH
1.0000 | ORAL_TABLET | Freq: Every day | ORAL | Status: DC
  Administered 2015-11-04: 1 via ORAL
  Filled 2015-11-04 (×2): qty 1

## 2015-11-04 MED ORDER — PROCHLORPERAZINE EDISYLATE 5 MG/ML IJ SOLN
5.0000 mg | Freq: Once | INTRAMUSCULAR | Status: AC
  Administered 2015-11-04: 5 mg via INTRAVENOUS
  Filled 2015-11-04: qty 2

## 2015-11-04 MED ORDER — PROMETHAZINE HCL 25 MG/ML IJ SOLN: 12.5000 mg | Freq: Four times a day (QID) | INTRAMUSCULAR | Status: DC | PRN

## 2015-11-04 MED ORDER — TOPIRAMATE 100 MG PO TABS
100.0000 mg | ORAL_TABLET | Freq: Every day | ORAL | Status: DC
  Administered 2015-11-04 – 2015-11-05 (×2): 100 mg via ORAL
  Filled 2015-11-04 (×2): qty 1

## 2015-11-04 MED ORDER — VITAMIN B-1 100 MG PO TABS
100.0000 mg | ORAL_TABLET | Freq: Every day | ORAL | Status: DC
  Administered 2015-11-04 – 2015-11-05 (×2): 100 mg via ORAL
  Filled 2015-11-04 (×2): qty 1

## 2015-11-04 MED ORDER — ACETAMINOPHEN 325 MG PO TABS
650.0000 mg | ORAL_TABLET | Freq: Four times a day (QID) | ORAL | Status: DC | PRN
  Filled 2015-11-04: qty 2

## 2015-11-04 MED ORDER — GABAPENTIN 100 MG PO CAPS
200.0000 mg | ORAL_CAPSULE | Freq: Three times a day (TID) | ORAL | Status: DC
  Administered 2015-11-04 – 2015-11-05 (×4): 200 mg via ORAL
  Filled 2015-11-04 (×4): qty 2

## 2015-11-04 MED ORDER — LORAZEPAM 2 MG/ML IJ SOLN
1.0000 mg | Freq: Four times a day (QID) | INTRAMUSCULAR | Status: DC | PRN
  Administered 2015-11-04: 1 mg via INTRAVENOUS
  Filled 2015-11-04: qty 1

## 2015-11-04 MED ORDER — PROCHLORPERAZINE MALEATE 5 MG PO TABS
5.0000 mg | ORAL_TABLET | Freq: Once | ORAL | Status: DC
  Filled 2015-11-04: qty 1

## 2015-11-04 MED ORDER — RISPERIDONE 0.5 MG PO TABS
0.5000 mg | ORAL_TABLET | Freq: Every day | ORAL | Status: DC
  Administered 2015-11-04: 0.5 mg via ORAL
  Filled 2015-11-04 (×3): qty 1

## 2015-11-04 MED ORDER — ENSURE ENLIVE PO LIQD: 237.0000 mL | Freq: Two times a day (BID) | ORAL | Status: DC

## 2015-11-04 MED ORDER — ALBUTEROL SULFATE (2.5 MG/3ML) 0.083% IN NEBU
3.0000 mL | INHALATION_SOLUTION | Freq: Four times a day (QID) | RESPIRATORY_TRACT | Status: DC | PRN
Start: 2015-11-04 — End: 2015-11-05

## 2015-11-04 MED ORDER — PANTOPRAZOLE SODIUM 40 MG PO TBEC
40.0000 mg | DELAYED_RELEASE_TABLET | Freq: Every day | ORAL | Status: DC
  Administered 2015-11-04 – 2015-11-05 (×2): 40 mg via ORAL
  Filled 2015-11-04 (×2): qty 1

## 2015-11-04 MED ORDER — INFLUENZA VAC SPLIT QUAD 0.5 ML IM SUSY
0.5000 mL | PREFILLED_SYRINGE | INTRAMUSCULAR | Status: AC
  Administered 2015-11-05: 0.5 mL via INTRAMUSCULAR
  Filled 2015-11-04: qty 0.5

## 2015-11-04 NOTE — H&P (Signed)
Family Medicine Teaching [facility] Admission History and Physical Service Pager: [phone]  Patient name: NERIA PROCTER Medical record number: [id number] Date of birth: 29-Dec-1960 Age: 55 y.o. Gender: female  Primary Care Provider: [name], MD Consultants: IR Code Status: FULL  Chief Complaint: Abdominal distention  Assessment and Plan: EVEE LISKA is a 55 y.o. female presenting with abdominal distention. PMH is significant for alcohol cirrhosis with ascites, alcohol abuse, bipolar disorder, GERD, anxiety, MS.  Alcoholic cirrhosis with ascites: Diagnosed 05/2015 and had negative hepatitis panel and autoimmune labs at that time. Ethanol 56. PT 16.1 and INR 1.28. Ammonia 50. WBC 11.5. Likely transudative fluid caused by cirrosis as pt is afebrile and WBC only mildly elevated to 11.5. No history of SBP on prior paracentesis. Patient does have abdominal pain but abdominal exam is unremarkable. Suspect pain most likely related to significant ascites on exam. Most recent paracentesis performed on 9/7 with 3.4L removed.  -admit to telemetry, vitals signs per units  -paracentesis ordered  -continue spironolactone and lasix, monitor BPs  -restarted lactulose and rifaximin  -consider starting abx for possible SBP if patient spikes fevers or decompensates  -Phenergan for nausea prn  -Toradol for abdominal pain prn  -PT consult   Hypokalemia: K 2.9 at admission. Likely secondary to EtOH use.  -replete with 40 mEq of Kdur x3 -check magnesium level, will likely need repletion   Alcohol abuse: Patient has been drinking large amounts of vodka (about 1 pint of vodka per day since recent discharge). Her last drink was this morning prior to admission. She states that she has never had a seizure from withdrawal but has become shaky/nauseous. Denies previous history of alcoholic hallucinations although these were documented from prior admission. Reports that she wants to go to rehab to  quick drinking.  -CIWA protocol -librium per detox protocol  -thiamine, folate, MVI  -social work consult placed    Bipolar disorder and anxiety: Pt has many admissions for IVC and psych management. On prozac 20 mg, gabapentin 200mg  TID, topamax 100mg  Qd, risperidone 0.5mg  QHS. -continue home meds  Multiple Sclerosis: Pt follows with [facility] neurology -continue home symmetrel   Prolonged QTc: Prolonged to 499 at admission.  -avoid QTc prolonging drugs, particularly Zofran   FEN/GI: Regular diet, SLIV  Prophylaxis: Lovenox  Disposition: admit to telemetry  History of Present Illness:  SHAMIKIA LINSKEY is a 55 y.o. female presenting with abdominal distention associated with pain and nausea. She went to see her primary care doctor for a hospital follow up this morning and was directly admitted from there. She was hospitalized from 10/15/15 to 10/24/15 for nausea x1 week with abdominal ascites and EtOH withdrawal. Since then she says she has been drinking a pint of vodka a day. Her last drink was this morning, reports only having a sip. Patient states that she has not been taking her medications for several days. Patient's mother says patient has been having trouble remembering things and says she has been having problems with alcohol abuse for the past 3-4 years. Mother reports that patient is unable to do most things for herself including bathing herself.  She had a bowel movement this morning, but can not remember how many she had yesterday.    Review Of Systems: Per HPI with the following additions:   Review of Systems  Cardiovascular: Positive for leg swelling.  Gastrointestinal: Positive for abdominal pain and nausea. Negative for vomiting.  Neurological: Positive for tremors. Negative for focal weakness and seizures.  Psychiatric/Behavioral: Positive for memory loss and substance abuse.    Patient Active Problem List   Diagnosis Date Noted  . Abdominal distention 11/04/2015  .  Shortness of breath   . Acute respiratory failure (HCC) 10/15/2015  . Ascites 10/15/2015  . Osteoporosis   . Cirrhosis (HCC)   . Bipolar disorder (HCC)   . Multiple sclerosis (HCC)   . Alcoholic cirrhosis of liver with ascites (HCC) 08/22/2015  . Bipolar disorder, current episode mixed, moderate (HCC) 08/01/2015  . Alcohol use disorder, severe, dependence (HCC) 07/31/2015  . Alcoholic cirrhosis (HCC) 05/31/2015  . Alcoholic hepatitis with ascites 05/31/2015  . Macrocytic anemia- due to alcohol abuse with normal B12 & folate levels 05/31/2015  . Severe protein-calorie malnutrition (HCC) 05/31/2015  . Hypokalemia 05/26/2015  . Encephalopathy, hepatic (HCC) 05/26/2015  . Anxiety 05/18/2015  . Sepsis (HCC) 05/18/2015  . UTI (lower urinary tract infection) 05/18/2015  . Stimulant abuse 12/27/2014  . Nicotine abuse 12/27/2014  . Hyperprolactinemia (HCC) 11/15/2014  . Dyslipidemia   . Noncompliance with therapeutic plan 04/04/2013  . Unspecified hereditary and idiopathic peripheral neuropathy 05/01/2012  . GERD (gastroesophageal reflux disease) 05/01/2012  . Osteoarthrosis, unspecified whether generalized or localized, involving lower leg 05/01/2012  . MS (multiple sclerosis) (HCC) 09/14/2011    Past Medical History: Past Medical History:  Diagnosis Date  . ADHD (attention deficit hyperactivity disorder)   . Anxiety   . Arthritis   . Ascites   . Bipolar disorder (HCC)   . Cirrhosis (HCC)   . Hypokalemia   . Migraine   . Multiple sclerosis (HCC)   . Osteoporosis     Past Surgical History: Past Surgical History:  Procedure Laterality Date  . CESAREAN SECTION  [id number]  . MYRINGOTOMY WITH TUBE PLACEMENT Bilateral   . TONSILLECTOMY      Social History: Social History  Substance Use Topics  . Smoking status: Current Every Day Smoker    Packs/day: 0.75    Years: 20.00    Types: Cigarettes  . Smokeless tobacco: Never Used     Comment: is cutting back, not in the car,  house  . Alcohol use 4.8 oz/week    2 Glasses of wine, 2 Cans of beer, 4 Standard drinks or equivalent per week     Comment: 10/2015   " I drink occasionaly "   Additional social history: lives alone, has 5 daughters Please also refer to relevant sections of EMR.  Family History: Family History  Problem Relation Age of Onset  . Heart disease Mother   . Heart disease Father   . Hypertension Father     Allergies and Medications: No Known Allergies No current facility-administered medications on file prior to encounter.    Current Outpatient Prescriptions on File Prior to Encounter  Medication Sig Dispense Refill  . albuterol (PROVENTIL HFA;VENTOLIN HFA) 108 (90 Base) MCG/ACT inhaler Inhale 2 puffs into the lungs every 6 (six) hours as needed for wheezing or shortness of breath.    Marland Kitchen. amantadine (SYMMETREL) 100 MG capsule Take 1 capsule (100 mg total) by mouth 2 (two) times daily. 30 capsule 0  . baclofen (LIORESAL) 10 MG tablet Take 1 tablet (10 mg total) by mouth 3 (three) times daily as needed for muscle spasms. 30 each 0  . feeding supplement, ENSURE ENLIVE, (ENSURE ENLIVE) LIQD Take 237 mLs by mouth 2 (two) times daily between meals. 237 mL 12  . feeding supplement, ENSURE ENLIVE, (ENSURE ENLIVE) LIQD Take 237 mLs by mouth daily. 237 mL  12  . FLUoxetine (PROZAC) 20 MG capsule Take 20 mg by mouth daily.   0  . Fluticasone-Salmeterol (ADVAIR) 250-50 MCG/DOSE AEPB Inhale 1 puff into the lungs 2 (two) times daily.    . folic acid (FOLVITE) 1 MG tablet Take 1 tablet (1 mg total) by mouth daily. 30 tablet 1  . furosemide (LASIX) 40 MG tablet Take 1 tablet (40 mg total) by mouth daily. 30 tablet 1  . furosemide (LASIX) 40 MG tablet Take 1 tablet (40 mg total) by mouth daily. 30 tablet 0  . gabapentin (NEURONTIN) 100 MG capsule Take 2 capsules (200 mg total) by mouth 3 (three) times daily. 90 capsule 0  . lactulose (CHRONULAC) 10 GM/15ML solution Take 30 mLs (20 g total) by mouth 2 (two)  times daily. 240 mL 0  . LORazepam (ATIVAN) 0.5 MG tablet Take 0.5 tablets (0.25 mg total) by mouth every 8 (eight) hours as needed for anxiety. 12 tablet 0  . Melatonin 3 MG TABS Take 1 tablet (3 mg total) by mouth at bedtime as needed (sleep). 30 tablet 0  . miconazole (MICOTIN) 100 MG vaginal suppository Place 1 suppository (100 mg total) vaginally at bedtime. 7 suppository 0  . Multiple Vitamin (MULTIVITAMIN WITH MINERALS) TABS tablet Take 1 tablet by mouth daily. 30 tablet 1  . nicotine (NICODERM CQ - DOSED IN MG/24 HOURS) 21 mg/24hr patch Place 1 patch (21 mg total) onto the skin daily. 28 patch 0  . nicotine (NICODERM CQ - DOSED IN MG/24 HOURS) 21 mg/24hr patch Place 1 patch (21 mg total) onto the skin daily. 28 patch 0  . pantoprazole (PROTONIX) 40 MG tablet Take 40 mg by mouth daily.    . polyethylene glycol (MIRALAX / GLYCOLAX) packet Take 17 g by mouth daily as needed for mild constipation. 14 each 0  . potassium chloride 20 MEQ TBCR Take 40 mEq by mouth daily. 60 tablet 0  . rifaximin (XIFAXAN) 550 MG TABS tablet Take 1 tablet (550 mg total) by mouth 2 (two) times daily. 60 tablet 0  . risperiDONE (RISPERDAL) 0.5 MG tablet Take 1 tablet (0.5 mg total) by mouth at bedtime. 30 tablet 0  . spironolactone (ALDACTONE) 100 MG tablet Take 1 tablet (100 mg total) by mouth daily. (Patient not taking: Reported on 10/15/2015) 30 tablet 1  . thiamine 100 MG tablet Take 1 tablet (100 mg total) by mouth daily. 30 tablet 1  . topiramate (TOPAMAX) 100 MG tablet Take 1 tablet (100 mg total) by mouth daily. 30 tablet 0    Objective: BP (!) 102/48   Pulse 88   Temp 98.7 F (37.1 C) (Oral)   Resp 18   SpO2 91%  Exam: General: Alert and oriented x3, mildly distressed Cardiovascular: RRR, no m/r/g, 1+ LE pitting edema bilaterally Respiratory: CTAB, no rales, rhonchi, or wheezes Gastrointestinal: +BS, distended, diffusely mildly TTP,  fluid wave, no rebound or guarding Skin: No visible rashes.  MSK:  Moves all extremities spontaneously.  Neuro: Strength 5/5 in all extremities. Alert and oriented x3.  Psych: mood and affect appropriate, slow speech  Labs and Imaging: CBC BMET   Recent Labs Lab 11/04/15 1654  WBC 11.5*  HGB 11.8*  HCT 36.2  PLT 247    Recent Labs Lab 11/04/15 1654  NA 140  K 2.9*  CL 102  CO2 23  BUN <5*  CREATININE 0.38*  GLUCOSE 74  CALCIUM 8.3*      Salma Mohammadi  [facility] FPTS  Intern pager: [phone], text pages welcome  RESIDENT ADDENDUM  I have separately seen and examined the patient. I have discussed the findings and exam with the medical student and agree with the above note, which I have edited appropriately. I helped develop the management plan that is described in the student's note, and I agree with the content.  Additionally I have outlined my exam and assessment/plan below:   PE:  General: Alert and oriented x3, mildly distressed Cardiovascular: RRR, no m/r/g, 1+ LE pitting edema bilaterally Respiratory: CTAB, no rales, rhonchi, or wheezes Gastrointestinal: +BS, distended, diffusely mildly TTP,  fluid wave, no rebound or guarding Skin: No visible rashes.  MSK: Moves all extremities spontaneously.  Neuro: Strength 5/5 in all extremities. Alert and oriented x3.  Psych: mood and affect appropriate, slow speech  A/P:  SETAYESH ALCIDE is 55 y.o. female with history of alcoholic cirrhosis who presented with increase in abdominal ascites. Patient has been drinking EtOH since recent discharge and has been non-adherent with spironolactone and lasix. Plan for paracentesis with IR. Re-start spironolactone, lasix, lactulose, and rifaximin. CIWA protocol with prn ativan initiated. Will also treat with Librium.    Marcy Siren, D.O. 11/04/2015, 8:15 PM PGY-2, [facility]

## 2015-11-04 NOTE — Telephone Encounter (Signed)
Placing forms in Dr. Benard Rink box so that she can notify pt mother that she will not be able to complete the forms due to there not being a DPR on file giving [place] permission to disclose the patients healthcare information to her, and also a release of information from the pt to give [place] permission to share her information with the insurance company.  I have also attached the proper forms for this to be completed by the patient when she has an opportunity to come fill them out. Handed forms to Dr. Chanetta Marshall and she stated that she would take them to the pt to have them signed at the hospital.    Anne Black, Jearldean Gutt D, CMA

## 2015-11-04 NOTE — Progress Notes (Signed)
Anne Black [id number] Admitted to 7G81: 11/04/2015 4:48 PM Attending Provider: Uvaldo Rising, MD    Anne Black is a 54 y.o. female patient admitted from ED awake, alert  & orientated  X 3, slow to respond.  Full Code, VSS - Blood pressure (!) 129/56, pulse 82, temperature 98.6 F (37 C), resp. rate 18, SpO2 94 %., R/A, no c/o shortness of breath, but is experiencing some with activity, no c/o chest pain, no distress noted, also having c/o of abd. Pain radiating to the back. Non Tele..   IV site WDL:  Non at present.  Allergies:  No Known Allergies   Past Medical History:  Diagnosis Date  . ADHD (attention deficit hyperactivity disorder)   . Anxiety   . Arthritis   . Bipolar disorder (HCC)   . Cirrhosis (HCC)   . Hypokalemia   . Migraine   . Multiple sclerosis (HCC)   . Osteoporosis     History: will be obtained from the patient and mother.  Pt orientation to unit, room and routine. Information packet given to patient/family and safety video watched.  Admission INP armband ID verified with patient/family, and in place. SR up x 2, fall risk assessment complete with Patient and family verbalizing understanding of risks associated with falls. Pt verbalizes an understanding of how to use the call bell and to call for help before getting out of bed.  Skin, clean-dry- intact with evidence of abrasions to left forearm and right upper arm, foam dressing applied.    Will cont to monitor and assist as needed.  Joana Reamer, RN 11/04/2015 4:48 PM

## 2015-11-05 ENCOUNTER — Other Ambulatory Visit: Payer: Self-pay | Admitting: Family Medicine

## 2015-11-05 ENCOUNTER — Inpatient Hospital Stay (HOSPITAL_COMMUNITY): Payer: Managed Care, Other (non HMO)

## 2015-11-05 DIAGNOSIS — Z9114 Patient's other noncompliance with medication regimen: Secondary | ICD-10-CM

## 2015-11-05 DIAGNOSIS — Z9289 Personal history of other medical treatment: Secondary | ICD-10-CM

## 2015-11-05 DIAGNOSIS — F1010 Alcohol abuse, uncomplicated: Secondary | ICD-10-CM

## 2015-11-05 DIAGNOSIS — K7031 Alcoholic cirrhosis of liver with ascites: Secondary | ICD-10-CM

## 2015-11-05 DIAGNOSIS — Z9189 Other specified personal risk factors, not elsewhere classified: Secondary | ICD-10-CM

## 2015-11-05 LAB — COMPREHENSIVE METABOLIC PANEL
ALK PHOS: 113 U/L (ref 38–126)
ALT: 23 U/L (ref 14–54)
AST: 41 U/L (ref 15–41)
Albumin: 2.6 g/dL — ABNORMAL LOW (ref 3.5–5.0)
Anion gap: 7 (ref 5–15)
BUN: <5 mg/dL — ABNORMAL LOW (ref 6–20)
CALCIUM: 8.1 mg/dL — AB (ref 8.9–10.3)
CO2: 28 mmol/L (ref 22–32)
CREATININE: 0.55 mg/dL (ref 0.44–1.00)
Chloride: 100 mmol/L — ABNORMAL LOW (ref 101–111)
Glucose, Bld: 104 mg/dL — ABNORMAL HIGH (ref 65–99)
Potassium: 3.5 mmol/L (ref 3.5–5.1)
Sodium: 135 mmol/L (ref 135–145)
Total Bilirubin: 2.6 mg/dL — ABNORMAL HIGH (ref 0.3–1.2)
Total Protein: 5.4 g/dL — ABNORMAL LOW (ref 6.5–8.1)

## 2015-11-05 LAB — LACTATE DEHYDROGENASE, PLEURAL OR PERITONEAL FLUID: LD FL: 46 U/L — AB (ref 3–23)

## 2015-11-05 LAB — PROTEIN, BODY FLUID

## 2015-11-05 LAB — BODY FLUID CELL COUNT WITH DIFFERENTIAL
EOS FL: 0 %
Lymphs, Fluid: 28 %
Monocyte-Macrophage-Serous Fluid: 46 % — ABNORMAL LOW (ref 50–90)
NEUTROPHIL FLUID: 26 % — AB (ref 0–25)
Total Nucleated Cell Count, Fluid: 54 cu mm (ref 0–1000)

## 2015-11-05 LAB — GLUCOSE, SEROUS FLUID: GLUCOSE FL: 99 mg/dL

## 2015-11-05 LAB — GRAM STAIN

## 2015-11-05 MED ORDER — LIDOCAINE HCL 1 % IJ SOLN
INTRAMUSCULAR | Status: AC
  Filled 2015-11-05: qty 20

## 2015-11-05 NOTE — Care Management Note (Signed)
Case Management Note  Patient Details  Name: Anne Black MRN: [id number] Date of Birth: 1960-08-30  Subjective/Objective:                 Spoke with patient in the room. She states that she is active w/ [facility] for PT OT and RN. CM would recommend adding HHA at DC. Patient states she has walker. CM discuss compliance with lactulose and home regimen considering high readmission rate of 5 admissions and 8 ED visits in last 6 months. [facility]. Patient stated she does not like going to the bathroom and losing control of her bowels because of lactulose. Patient agreed that she would be more compliant with lactulose if she had a BSC. CM agreed with plan and patient will be sent home with one. [place] lives alone in [place], but mother who lives in [place] provides support with driving patient to appointments etc. Patient states that she drives and has a car. PCP is [facility] in [place] Patient states that either her mom or her ex-husband will provide transportation home.    Action/Plan:  BSC to be delivered to room prior to DC. [facility]  [facility]   Expected Discharge Date:                  Expected Discharge Plan:  Home w Home Health Services  In-House Referral:     Discharge planning Services  CM Consult  Post Acute Care Choice:  Home Health, Resumption of Svcs/PTA Provider, Durable Medical Equipment Choice offered to:  Patient  DME Arranged:  3-N-1 DME Agency:  [facility]  [facility]  Status of Service:  In process, will continue to follow  If discussed at Long Length of Stay Meetings, dates discussed:    Additional Comments:  Lawerance SabalDebbie Tomoko Sandra, RN 11/05/2015, 11:44 AM

## 2015-11-05 NOTE — Progress Notes (Signed)
Family Medicine Teaching Service Daily Progress Note Intern Pager: [phone]  Patient name: Anne Black Medical record number: [id number] Date of birth: Feb 04, 1961 Age: 55 y.o. Gender: female  Primary Care Provider: [name], MD Consultants: none Code Status: FULL  Pt Overview and Major Events to Date:  9/26 admitted for ascites  9/27 paracentesis  Assessment and Plan: Anne Black is a 55 y.o. female presenting with abdominal distention. PMH is significant for alcohol cirrhosis with ascites, alcohol abuse, bipolar disorder, GERD, anxiety, MS.  Alcoholic cirrhosis with ascites: Diagnosed 05/2015 and had negative hepatitis panel and autoimmune labs at that time. Ethanol 56. PT 16.1 and INR 1.28. Ammonia 50. WBC 11.5. Likely transudative fluid caused by cirrosis as pt is afebrile and WBC only mildly elevated to 11.5. No history of SBP on prior paracentesis. Patient does have abdominal pain but abdominal exam is unremarkable. Suspect pain most likely related to significant ascites on exam. Most recent paracentesis performed on 9/7 with 3.4L removed.  -paracentesis ordered  -continue spironolactone and lasix, monitor BPs  -restarted lactulose and rifaximin  -consider starting abx for possible SBP if patient spikes fevers or decompensates  -Phenergan for nausea prn  -Toradol for abdominal pain prn  -PT consult   Hypokalemia: K 2.9 at admission. Likely secondary to EtOH use. Stable after repletion. Magnesium ordered as alcoholics are frequently serum eugmagnesiemic and intracellularly hypomagnesemic. -monitor  Alcohol abuse: Patient has been drinking large amounts of vodka (about 1 pint of vodka per day since recent discharge). Her last drink was this morning prior to admission. She states that she has never had a seizure from withdrawal but has become shaky/nauseous. Denies previous history of alcoholic hallucinations although these were documented from prior admission.  Reports that she wants to go to rehab to quick drinking.  -CIWA protocol -librium per detox protocol  -thiamine, folate, MVI  -social work consult placed   Bipolar disorder and anxiety: Pt has many admissions for IVC and psych management. On prozac 20 mg, gabapentin 200mg  TID, topamax 100mg  Qd, risperidone 0.5mg  QHS. -continue home meds  Multiple Sclerosis: Pt follows with [facility] neurology -continue home symmetrel   Prolonged QTc: Prolonged to 499 at admission.  -avoid QTc prolonging drugs, particularly Zofran   FEN/GI: Regular diet, SLIV  Prophylaxis: Lovenox  Disposition: discharge pending paracentesis  Subjective:  Patient feels well today, ambivalent about alcohol detox. Amenable to tap today. Says her last drink was 9/25, several servings of vodka. Did not understand that her spiro would help keep her potassium up.  Objective: Temp:  [98.2 F (36.8 C)-98.7 F (37.1 C)] 98.2 F (36.8 C) (09/27 0521) Pulse Rate:  [82-100] 100 (09/27 0521) Resp:  [18-20] 18 (09/27 0521) BP: (102-129)/(48-106) 118/65 (09/27 0521) SpO2:  [90 %-98 %] 98 % (09/27 0521) Weight:  [164 lb (74.4 kg)] 164 lb (74.4 kg) (09/26 1024) Physical Exam: General: Alert and oriented x3, NAD, eating breakfast. Cardiovascular: RRR, no m/r/g, 1+ LE pitting edema bilaterally Respiratory: CTAB, no rales, rhonchi, or wheezes Gastrointestinal: +BS, distended, diffusely mildly TTP,  fluid wave, no rebound or guarding Skin: No visible rashes.  MSK: Moves all extremities spontaneously.  Neuro: Strength 5/5 in all extremities. Alert and oriented x3.  Psych: mood and affect appropriate, slow speech  Laboratory:  Recent Labs Lab 11/04/15 1654  WBC 11.5*  HGB 11.8*  HCT 36.2  PLT 247    Recent Labs Lab 11/04/15 1654  NA 140  K 2.9*  CL 102  CO2 23  BUN <5*  CREATININE 0.38*  CALCIUM 8.3*  PROT 6.1*  BILITOT 2.0*  ALKPHOS 119  ALT 24  AST 55*  GLUCOSE 74     Garth BignessKathryn Gianella Chismar,  MD 11/05/2015, 7:30 AM PGY-1, [facility] FPTS Intern pager: [phone], text pages welcome

## 2015-11-05 NOTE — Evaluation (Signed)
Physical Therapy Evaluation Patient Details Name: Anne Black MRN: [id number] DOB: Apr 11, 1960 Today's Date: 11/05/2015   History of Present Illness  Anne Black is a 55 y.o. female presenting with nausea. This began 1 week ago. She has not actually vomited, just having dry heaving with diffuse abdominal pain that radiates to the lower back. PMH: alcoholic cirrhosis, bipolar, GERD, anxiety, MS. Concerned that she is dehydrated from taking Lasix 40 mg daily.  Clinical Impression  Patient evaluated by Physical Therapy with no further acute PT needs identified. All education has been completed and the patient has no further questions. Pt is moving better today than when she was here last, earlier this month. Ambulated 200' with supervision. Recommend that she continue to ambulate with family or nsg and resume HHPT at discharge.  See below for any follow-up Physical Therapy or equipment needs. PT is signing off. Thank you for this referral.     Follow Up Recommendations Home health PT    Equipment Recommendations  None recommended by PT    Recommendations for Other Services       Precautions / Restrictions Precautions Precautions: Fall Precaution Comments: ETOH CIWA protocol Restrictions Weight Bearing Restrictions: No      Mobility  Bed Mobility Overal bed mobility: Independent                Transfers Overall transfer level: Modified independent Equipment used: None Transfers: Sit to/from Stand Sit to Stand: Modified independent (Device/Increase time)         General transfer comment: pt stood from bed and toilet without assistance, no dizziness or LOB.  Ambulation/Gait Ambulation/Gait assistance: Supervision Ambulation Distance (Feet): 200 Feet Assistive device: None Gait Pattern/deviations: Step-through pattern;Decreased step length - right;Decreased step length - left Gait velocity: decreased Gait velocity interpretation: Below normal speed for  age/gender General Gait Details: pt began pushing IV pole (then asked if she could just leave it behind, had to show her where she was connected to it), then able to ambulate with therapist pushing pole. Fatigued after 150', standing rest break taken.   Stairs            Wheelchair Mobility    Modified Rankin (Stroke Patients Only)       Balance Overall balance assessment: Needs assistance Sitting-balance support: No upper extremity supported Sitting balance-Leahy Scale: Good     Standing balance support: No upper extremity supported Standing balance-Leahy Scale: Fair Standing balance comment: balance close to "good", she is able to shift wt but minimally and cannot accept quick or moderate perturbations                             Pertinent Vitals/Pain Pain Assessment: Faces Faces Pain Scale: Hurts a little bit Pain Location: "all over" from my MS Pain Descriptors / Indicators: Aching Pain Intervention(s): Limited activity within patient's tolerance;Monitored during session    Home Living Family/patient expects to be discharged to:: Private residence Living Arrangements: Alone Available Help at Discharge: Family;Available PRN/intermittently Type of Home: House Home Access: Stairs to enter Entrance Stairs-Rails: None Entrance Stairs-Number of Steps: 1 Home Layout: Able to live on main level with bedroom/bathroom;Multi-level Home Equipment: Walker - 2 wheels;Cane - single point;Wheelchair - manual Additional Comments: pt reports that her ex husband and her 5 kids help out as needed but none live with her    Prior Function Level of Independence: Independent with assistive device(s)   Gait / Transfers Assistance Needed:  pt reports that some days she uses RW and some she does not, depending on how she feels. She reports a fall 6 wks ago after tripping over a cord.      Comments: chart notes that per pt's mother, she has needed help caring for herself  though pt does not admit to this     Hand Dominance        Extremity/Trunk Assessment   Upper Extremity Assessment: Generalized weakness           Lower Extremity Assessment: Generalized weakness      Cervical / Trunk Assessment: Normal  Communication   Communication: No difficulties  Cognition Arousal/Alertness: Awake/alert Behavior During Therapy: Anxious Overall Cognitive Status: Within Functional Limits for tasks assessed           Safety/Judgement: Decreased awareness of safety     General Comments: appropriate throughout session but decreased caution for IV    General Comments General comments (skin integrity, edema, etc.): due to fatigue with ambulation, encouraged pt to use her RW at home for first week home. Also spoke with pt and NT about ambulating 2 more times today and 3x tomorrow. Family can also walk with her if they visit    Exercises     Assessment/Plan    PT Assessment All further PT needs can be met in the next venue of care  PT Problem List Decreased strength;Decreased activity tolerance;Decreased balance;Decreased mobility;Decreased coordination;Decreased knowledge of use of DME;Decreased knowledge of precautions          PT Treatment Interventions      PT Goals (Current goals can be found in the Care Plan section)  Acute Rehab PT Goals Patient Stated Goal: go home PT Goal Formulation: All assessment and education complete, DC therapy Time For Goal Achievement: 12/19/15 Potential to Achieve Goals: Good    Frequency     Barriers to discharge Decreased caregiver support lives alone but per pt, family checks on her regularly?    Co-evaluation               End of Session Equipment Utilized During Treatment: Gait belt Activity Tolerance: Patient tolerated treatment well Patient left: in bed;with call bell/phone within reach Nurse Communication: Mobility status         Time: 3419-3790 PT Time Calculation (min) (ACUTE  ONLY): 17 min   Charges:   PT Evaluation $PT Eval Moderate Complexity: 1 Procedure     PT G Codes:      Leighton Roach, PT  Acute Rehab Services  [place], [place] 11/05/2015, 1:44 PM

## 2015-11-05 NOTE — Progress Notes (Signed)
CSW received consult regarding sending medical records to [facility] [place] for pt to enter into ETOH treatment. Patient signed PHI Disclosure form (placed on hard chart). CSW faxed records to requested facility and provided a list of additional ETOH treatment facilities.  CSW signing off.   Osborne Casco Dela Sweeny LCSWA [phone]

## 2015-11-05 NOTE — Discharge Summary (Signed)
Family Medicine Teaching [facility] Discharge Summary  Patient name: Anne Black Medical record number: [id number] Date of birth: [birth year] Age: 55 y.o. Gender: female Date of Admission: 11/04/2015  Date of Discharge: 11/05/15 Admitting Physician: Doreene Eland, MD  Primary Care Provider: [name], MD Consultants: none  Indication for Hospitalization: ascites   Discharge Diagnoses/Problem List:  Alcoholic cirrhosis with ascites Bipolar MS Chronic hypokalemia  Disposition: home  Discharge Condition: stable  Discharge Exam:  General: Alert and oriented x3, NAD, eating breakfast. Cardiovascular: RRR, no m/r/g, 1+ LE pitting edema bilaterally Respiratory: CTAB, no rales, rhonchi, or wheezes Gastrointestinal: +BS, mildly distended, no rebound or guarding Skin: No visible rashes.  MSK: Moves all extremities spontaneously.  Neuro: Strength 5/5 in all extremities. Alert and oriented x3.  Psych: mood and affect appropriate, slow speech  Brief Hospital Course:  Patient recently discharged for ascites and alcohol detox. Presented to outpatient hospital follow up appointment with recurrent ascites, abdominal pain due to tightness, and having consumed 1 pint of vodka per day. Directly admitted for paracentesis. Patient noncompliant with lasix, lactulose, and spironolactone at home. Patient reported some desire for alcohol withdrawal, although this was in the presence of her mother, who is forceful while well-meaning that her daughter should detox. Patient regresses somewhat in the presence of her mom and does not participate in her own care. When discussed alone, patient had minimal desire to stop drinking. On admission, EtOH level 56, INR 1.28, ammonia 50. K 2.9 on admission, repleted this and magnesium. Only 1 elevated CIWA score while admitted. Paracentesis performed for 3.9L of transudative fluid. Patient felt much better after paracentesis, amenable to going home to  continue home PT with hopes of possible inpatient vs outpatient alcohol rehab after she is strong enough.   Issues for Follow Up:  1. Schedule colonoscopy. 2. Check repeat BMP, history of hypokalemia. 3. Consider outpatient alcohol rehab.  Significant Procedures: paracentesis 11/05/15  Significant Labs and Imaging:   Recent Labs Lab 11/04/15 1654  WBC 11.5*  HGB 11.8*  HCT 36.2  PLT 247    Recent Labs Lab 11/04/15 1654 11/05/15 0623  NA 140 135  K 2.9* 3.5  CL 102 100*  CO2 23 28  GLUCOSE 74 104*  BUN <5* <5*  CREATININE 0.38* 0.55  CALCIUM 8.3* 8.1*  MG 1.6*  --   ALKPHOS 119 113  AST 55* 41  ALT 24 23  ALBUMIN 2.8* 2.6*    Results/Tests Pending at Time of Discharge: none  Discharge Medications:    Medication List    STOP taking these medications   LORazepam 0.5 MG tablet Commonly known as:  ATIVAN   thiamine 100 MG tablet     TAKE these medications   albuterol 108 (90 Base) MCG/ACT inhaler Commonly known as:  PROVENTIL HFA;VENTOLIN HFA Inhale 2 puffs into the lungs every 6 (six) hours as needed for wheezing or shortness of breath.   amantadine 100 MG capsule Commonly known as:  SYMMETREL Take 1 capsule (100 mg total) by mouth 2 (two) times daily.   baclofen 10 MG tablet Commonly known as:  LIORESAL Take 1 tablet (10 mg total) by mouth 3 (three) times daily as needed for muscle spasms.   feeding supplement (ENSURE ENLIVE) Liqd Take 237 mLs by mouth 2 (two) times daily between meals.   feeding supplement (ENSURE ENLIVE) Liqd Take 237 mLs by mouth daily.   FLUoxetine 20 MG capsule Commonly known as:  PROZAC Take 20 mg by mouth  daily.   Fluticasone-Salmeterol 250-50 MCG/DOSE Aepb Commonly known as:  ADVAIR Inhale 1 puff into the lungs 2 (two) times daily.   folic acid 1 MG tablet Commonly known as:  FOLVITE Take 1 tablet (1 mg total) by mouth daily.   furosemide 40 MG tablet Commonly known as:  LASIX Take 1 tablet (40 mg total) by  mouth daily. What changed:  Another medication with the same name was removed. Continue taking this medication, and follow the directions you see here.   gabapentin 100 MG capsule Commonly known as:  NEURONTIN Take 2 capsules (200 mg total) by mouth 3 (three) times daily.   lactulose 10 GM/15ML solution Commonly known as:  CHRONULAC Take 30 mLs (20 g total) by mouth 2 (two) times daily.   Melatonin 3 MG Tabs Take 1 tablet (3 mg total) by mouth at bedtime as needed (sleep).   miconazole 100 MG vaginal suppository Commonly known as:  MICOTIN Place 1 suppository (100 mg total) vaginally at bedtime.   multivitamin with minerals Tabs tablet Take 1 tablet by mouth daily.   nicotine 21 mg/24hr patch Commonly known as:  NICODERM CQ - dosed in mg/24 hours Place 1 patch (21 mg total) onto the skin daily. What changed:  Another medication with the same name was removed. Continue taking this medication, and follow the directions you see here.   pantoprazole 40 MG tablet Commonly known as:  PROTONIX Take 40 mg by mouth daily.   polyethylene glycol packet Commonly known as:  MIRALAX / GLYCOLAX Take 17 g by mouth daily as needed for mild constipation.   Potassium Chloride ER 20 MEQ Tbcr Take 40 mEq by mouth daily.   rifaximin 550 MG Tabs tablet Commonly known as:  XIFAXAN Take 1 tablet (550 mg total) by mouth 2 (two) times daily.   risperiDONE 0.5 MG tablet Commonly known as:  RISPERDAL Take 1 tablet (0.5 mg total) by mouth at bedtime.   spironolactone 100 MG tablet Commonly known as:  ALDACTONE Take 1 tablet (100 mg total) by mouth daily.   topiramate 100 MG tablet Commonly known as:  TOPAMAX Take 1 tablet (100 mg total) by mouth daily.       Discharge Instructions: Please refer to Patient Instructions section of EMR for full details.  Patient was counseled important signs and symptoms that should prompt return to medical care, changes in medications, dietary instructions,  activity restrictions, and follow up appointments.   Follow-Up Appointments: Follow-up Information    [facility] .   Why:  to resume services at discharge, bedside commode will be delivered to room prior to discharge Contact information: [address] [place] [address] [phone]        Palma HolterKanishka G Gunadasa, MD. Go on 11/17/2015.   Specialty:  Family Medicine Why:  hospital follow up for potassium at 1:45pm, arrive by 1:30pm Contact information: [address] [place] [address] [phone]           Garth BignessKathryn Kamal Jurgens, MD 11/08/2015, 8:19 AM PGY-1, [facility]

## 2015-11-05 NOTE — Procedures (Signed)
Ultrasound-guided diagnostic and therapeutic paracentesis performed yielding 3.9 liters of yellow, clear colored fluid. No immediate complications.  Anne Black E 11/05/2015

## 2015-11-07 NOTE — Telephone Encounter (Signed)
Form completed 11/05/15 after patient signed release of information form. Form given directly to patient's mother at discharge by floor nurse, along with d/c instructions. Will bring release of information form to be scanned to [facility] box.

## 2015-11-10 LAB — CULTURE, BODY FLUID W GRAM STAIN -BOTTLE

## 2015-11-10 LAB — CULTURE, BODY FLUID-BOTTLE: CULTURE: NO GROWTH

## 2015-11-14 ENCOUNTER — Telehealth: Payer: Self-pay | Admitting: Internal Medicine

## 2015-11-14 NOTE — Telephone Encounter (Signed)
Will forward to Dr. Gunadasa to advise. Dade Rodin,CMA  

## 2015-11-14 NOTE — Telephone Encounter (Signed)
Just a heads up. In previous notes, Dr. Chanetta Marshall filled out a form for [employer] insurance and it was supposed to be given to the pt's mom at the hospital. Mom states she was not given the form and that the pt must have been given the form. Mom is going to try and get another form from [place] and as soon as we receive it please get it filled out ASAP.Thanks! ep

## 2015-11-16 NOTE — Progress Notes (Deleted)
   [facility] Phone: [phone]   Date of Visit: 06/11/2015   HPI:  Patient is here for hospital follow up. She was last seen in clinic as a new patient hospital follow up in 9/26 for similar symptoms. At that time she was noncompliant with medication and had to be re-admitted.   She was hospitalized from 9/26 to 9/27. She was restarted on her medications. It is uncertain if patient desires to stop drinking as it was noted that "When discussed alone (without her mother present), patient had minimal desire to stop drinking. On admission, EtOH level 56, INR 1.28, ammonia 50. K 2.9 on admission. She had another paracentesis done with 3.9 L transudative fluid.   ROS: See HPI.  PMFSH:  PMH: Alcoholic Cirrhosis GERD Hyperprolactinemia MS Chronic Peripheral Neuropathy Osteoporosis  OA unspecified  HLD Stimulant abuse/nicotine abuse Alcohol Abuse  Bipolar DO, Anxiety  Asthma?  PHYSICAL EXAM: There were no vitals taken for this visit. Gen: *** HEENT: *** Heart: *** Lungs: *** Neuro: *** Ext: ***  ASSESSMENT/PLAN:  Health maintenance:  -***  No problem-specific Assessment & Plan notes found for this encounter.  FOLLOW UP: Follow up in *** for ***  [name], MD PGY 2 [facility]

## 2015-11-17 ENCOUNTER — Inpatient Hospital Stay: Payer: Self-pay | Admitting: Internal Medicine

## 2015-11-18 NOTE — Telephone Encounter (Signed)
Ms hatley brought in another form to be completed.  Dr Chanetta Marshallimberlake is the dr that completed the original form.  Form is in an envelope in white team folder

## 2015-11-18 NOTE — Telephone Encounter (Signed)
Placed form in Dr. Christena Flakeimberlake's box for completion.  Anne Black, Darroll Bredeson D, [place]

## 2015-11-18 NOTE — Telephone Encounter (Signed)
Mother cannot locate the form. She will bring in another one to be completed. She is embarrassed to have to ask to have this filled out again

## 2015-11-21 NOTE — Telephone Encounter (Signed)
Left voice message for patient that form is complete and ready for pick up.  Martin, Tamika L, RN  

## 2015-11-21 NOTE — Telephone Encounter (Signed)
Completed form and placed it in [place] box. Please call Kaleen MaskMarge Hatley at [phone] or [phone] to let her know this is ready for pickup.

## 2015-12-08 ENCOUNTER — Telehealth: Payer: Self-pay | Admitting: Internal Medicine

## 2015-12-08 NOTE — Telephone Encounter (Signed)
Pt called and needs some nausea. She is also would like to have some advice about getting into a long term care facility. jw

## 2015-12-08 NOTE — Telephone Encounter (Signed)
Reports she is scared because her abdomen is distended. Reports it is worse than before. Reports of nausea for the past 3-4 days. Unable to keep "hardly anything" for the past 4 days. Reports she feels dizzy. Reports she has been drinking again.   Reports that she needs to be in long term facility because "she cannot take care of myself".   I recommended that she be evaluated in the ED for her symptoms.  She agreed and plans on getting a ride to the ED.

## 2015-12-10 ENCOUNTER — Emergency Department (HOSPITAL_COMMUNITY)
Admission: EM | Admit: 2015-12-10 | Discharge: 2015-12-10 | Discharge status: Against Medical Advice | Payer: Managed Care, Other (non HMO) | Attending: Emergency Medicine | Admitting: Emergency Medicine

## 2015-12-10 ENCOUNTER — Encounter (HOSPITAL_COMMUNITY): Payer: Self-pay | Admitting: Emergency Medicine

## 2015-12-10 DIAGNOSIS — F17210 Nicotine dependence, cigarettes, uncomplicated: Secondary | ICD-10-CM | POA: Insufficient documentation

## 2015-12-10 DIAGNOSIS — F909 Attention-deficit hyperactivity disorder, unspecified type: Secondary | ICD-10-CM | POA: Insufficient documentation

## 2015-12-10 DIAGNOSIS — R109 Unspecified abdominal pain: Secondary | ICD-10-CM | POA: Insufficient documentation

## 2015-12-10 DIAGNOSIS — Z79899 Other long term (current) drug therapy: Secondary | ICD-10-CM | POA: Diagnosis not present

## 2015-12-10 NOTE — ED Notes (Signed)
Bed: WA26 Expected date:  Expected time:  Means of arrival:  Comments: EMS-ETOH 

## 2015-12-10 NOTE — ED Notes (Signed)
THE PT HAS DECIDED TO STAY STATING, "I'M SCARED OF DYING. I'LL STAY SO YOU CAN HELP ME."

## 2015-12-10 NOTE — ED Triage Notes (Signed)
Per EMS: Pt brought from home.  A&O x 4.  Pt w/ hx of chronic abd pain and MS.  Pt states that she drank a half a bottle of vodka and has been having diarrhea.  Hx of anxiety.

## 2015-12-10 NOTE — ED Notes (Signed)
[employer] called for taxi. Pt ambulated in hall before being taken to the front in a wheel chair.

## 2015-12-10 NOTE — Progress Notes (Signed)
Pt with [facility] ED visits x 7 and admissions x 4  In the last 6 months  Pt with frequent calls to pcp office, GUNADASA,  For recent assistance, guidance, etoh detox program assistance as noted in EPIC  For this encounter pt with c/o etoh, chronic abdominal pain and MS - Pt has had incident of wanting to leave AMA see nursing notes No ED CP nor availability for [facility] services

## 2015-12-10 NOTE — ED Provider Notes (Signed)
[facility]-EMERGENCY DEPT Provider Note   CSN: [id number] Arrival date & time: 12/10/15  1228     History   Chief Complaint Chief Complaint  Patient presents with  . Alcohol Intoxication  . Abdominal Pain    HPI Anne Black is a 55 y.o. female.  HPI patient with history of alcoholism, cirrhosis with ascites, chronic abdominal pain here for acute alcohol intoxication and abdominal pain. Patient reports she has been drinking roughly "a fourth of vodka every day ". She also admits that she has not been compliant with her medications. She reports associated lower abdominal discomfort today. Reports mild nausea but no vomiting. 2 episodes of loose stool, nonbloody or overtly dark. Denies fevers, chills, urinary symptoms, chest pain, shortness of breath or rash.  Past Medical History:  Diagnosis Date  . ADHD (attention deficit hyperactivity disorder)   . Anxiety   . Arthritis   . Ascites   . Bipolar disorder (HCC)   . Cirrhosis (HCC)   . Hypokalemia   . Migraine   . Multiple sclerosis (HCC)   . Osteoporosis     Patient Active Problem List   Diagnosis Date Noted  . Abdominal distension 11/04/2015  . Alcohol abuse   . Shortness of breath   . Acute respiratory failure (HCC) 10/15/2015  . Ascites 10/15/2015  . Osteoporosis   . Cirrhosis (HCC)   . Bipolar I disorder (HCC)   . Multiple sclerosis (HCC)   . Alcoholic cirrhosis of liver with ascites (HCC) 08/22/2015  . Bipolar disorder, current episode mixed, moderate (HCC) 08/01/2015  . Alcohol use disorder, severe, dependence (HCC) 07/31/2015  . Alcoholic cirrhosis (HCC) 05/31/2015  . Alcoholic hepatitis with ascites 05/31/2015  . Macrocytic anemia- due to alcohol abuse with normal B12 & folate levels 05/31/2015  . Severe protein-calorie malnutrition (HCC) 05/31/2015  . Hypokalemia 05/26/2015  . Encephalopathy, hepatic (HCC) 05/26/2015  . Anxiety 05/18/2015  . Sepsis (HCC) 05/18/2015  . UTI (lower urinary tract infection)  05/18/2015  . Stimulant abuse 12/27/2014  . Nicotine abuse 12/27/2014  . Hyperprolactinemia (HCC) 11/15/2014  . Dyslipidemia   . Noncompliance with therapeutic plan 04/04/2013  . Unspecified hereditary and idiopathic peripheral neuropathy 05/01/2012  . GERD (gastroesophageal reflux disease) 05/01/2012  . Osteoarthrosis, unspecified whether generalized or localized, involving lower leg 05/01/2012  . MS (multiple sclerosis) (HCC) 09/14/2011    Past Surgical History:  Procedure Laterality Date  . CESAREAN SECTION  [id number]  . MYRINGOTOMY WITH TUBE PLACEMENT Bilateral   . TONSILLECTOMY      OB History    No data available       Home Medications    Prior to Admission medications   Medication Sig Start Date End Date Taking? Authorizing Provider  albuterol (PROVENTIL HFA;VENTOLIN HFA) 108 (90 Base) MCG/ACT inhaler Inhale 2 puffs into the lungs every 6 (six) hours as needed for wheezing or shortness of breath.    Historical Provider, MD  amantadine (SYMMETREL) 100 MG capsule Take 1 capsule (100 mg total) by mouth 2 (two) times daily. 08/04/15   Oneta Rack, NP  baclofen (LIORESAL) 10 MG tablet Take 1 tablet (10 mg total) by mouth 3 (three) times daily as needed for muscle spasms. 10/24/15   Garth Bigness, MD  feeding supplement, ENSURE ENLIVE, (ENSURE ENLIVE) LIQD Take 237 mLs by mouth 2 (two) times daily between meals. 10/16/15   Garth Bigness, MD  feeding supplement, ENSURE ENLIVE, (ENSURE ENLIVE) LIQD Take 237 mLs by mouth daily. 10/25/15   Garth Bigness, MD  FLUoxetine (PROZAC) 20 MG capsule Take 20 mg by mouth daily.     Historical Provider, MD  Fluticasone-Salmeterol (ADVAIR) 250-50 MCG/DOSE AEPB Inhale 1 puff into the lungs 2 (two) times daily.    Historical Provider, MD  folic acid (FOLVITE) 1 MG tablet Take 1 tablet (1 mg total) by mouth daily. 08/30/15   Barnetta ChapelSylvester I Ogbata, MD  furosemide (LASIX) 40 MG tablet Take 1 tablet (40 mg total) by mouth daily. 08/30/15    Barnetta ChapelSylvester I Ogbata, MD  gabapentin (NEURONTIN) 100 MG capsule Take 2 capsules (200 mg total) by mouth 3 (three) times daily. 08/30/15   Barnetta ChapelSylvester I Ogbata, MD  lactulose (CHRONULAC) 10 GM/15ML solution Take 30 mLs (20 g total) by mouth 2 (two) times daily. 08/30/15   Barnetta ChapelSylvester I Ogbata, MD  Melatonin 3 MG TABS Take 1 tablet (3 mg total) by mouth at bedtime as needed (sleep). 10/24/15   Garth BignessKathryn Timberlake, MD  miconazole (MICOTIN) 100 MG vaginal suppository Place 1 suppository (100 mg total) vaginally at bedtime. 10/25/15   Garth BignessKathryn Timberlake, MD  Multiple Vitamin (MULTIVITAMIN WITH MINERALS) TABS tablet Take 1 tablet by mouth daily. 08/30/15   Barnetta ChapelSylvester I Ogbata, MD  nicotine (NICODERM CQ - DOSED IN MG/24 HOURS) 21 mg/24hr patch Place 1 patch (21 mg total) onto the skin daily. 08/04/15   Oneta Rackanika N Lewis, NP  pantoprazole (PROTONIX) 40 MG tablet Take 40 mg by mouth daily.    Historical Provider, MD  polyethylene glycol (MIRALAX / GLYCOLAX) packet Take 17 g by mouth daily as needed for mild constipation. 10/16/15   Garth BignessKathryn Timberlake, MD  potassium chloride 20 MEQ TBCR Take 40 mEq by mouth daily. 10/16/15   Garth BignessKathryn Timberlake, MD  rifaximin (XIFAXAN) 550 MG TABS tablet Take 1 tablet (550 mg total) by mouth 2 (two) times daily. 06/05/15   Catarina Hartshornavid Tat, MD  risperiDONE (RISPERDAL) 0.5 MG tablet Take 1 tablet (0.5 mg total) by mouth at bedtime. 08/04/15   Oneta Rackanika N Lewis, NP  spironolactone (ALDACTONE) 100 MG tablet Take 1 tablet (100 mg total) by mouth daily. Patient not taking: Reported on 10/15/2015 08/30/15   Barnetta ChapelSylvester I Ogbata, MD  topiramate (TOPAMAX) 100 MG tablet Take 1 tablet (100 mg total) by mouth daily. 02/21/15   Thermon LeylandLaura A Davis, NP    Family History Family History  Problem Relation Age of Onset  . Heart disease Mother   . Heart disease Father   . Hypertension Father     Social History Social History  Substance Use Topics  . Smoking status: Current Every Day Smoker    Packs/day: 0.75    Years: 20.00      Types: Cigarettes  . Smokeless tobacco: Never Used     Comment: is cutting back, not in the car, house  . Alcohol use 4.8 oz/week    2 Glasses of wine, 2 Cans of beer, 4 Standard drinks or equivalent per week     Comment: 10/2015   " I drink occasionaly "     Allergies   Review of patient's allergies indicates no known allergies.   Review of Systems Review of Systems A 10 point review of systems was completed and was negative except for pertinent positives and negatives as mentioned in the history of present illness    Physical Exam Updated Vital Signs BP 106/74   Pulse 80   Temp 98.2 F (36.8 C) (Oral)   Resp 18   SpO2 97%   Physical Exam  Constitutional: She appears well-developed. No  distress.  Awake, alert and nontoxic in appearance  HENT:  Head: Normocephalic and atraumatic.  Right Ear: External ear normal.  Left Ear: External ear normal.  Mouth/Throat: Oropharynx is clear and moist.  Eyes: Conjunctivae and EOM are normal. Pupils are equal, round, and reactive to light.  Neck: Normal range of motion. No JVD present.  Cardiovascular: Normal rate, regular rhythm and normal heart sounds.   Pulmonary/Chest: Effort normal and breath sounds normal. No stridor.  Abdominal: Soft. She exhibits distension.  Patient does have diffusely distended abdomen. No peritoneal signs.  Musculoskeletal: Normal range of motion.  Neurological:  Awake, alert, cooperative and aware of situation; motor strength bilaterally; sensation normal to light touch bilaterally; no facial asymmetry; tongue midline; major cranial nerves appear intact;  baseline gait without new ataxia.  Skin: No rash noted. She is not diaphoretic.  Psychiatric: She has a normal mood and affect. Her behavior is normal. Thought content normal.  Nursing note and vitals reviewed.    ED Treatments / Results  Labs (all labs ordered are listed, but only abnormal results are displayed) Labs Reviewed - No data to  display  EKG  EKG Interpretation None       Radiology No results found.  Procedures Procedures (including critical care time)  Medications Ordered in ED Medications - No data to display   Initial Impression / Assessment and Plan / ED Course  I have reviewed the triage vital signs and the nursing notes.  Pertinent labs & imaging results that were available during my care of the patient were reviewed by me and considered in my medical decision making (see chart for details).  Clinical Course    Patient leaves AMA prior to completion of workup and exam.   Final Clinical Impressions(s) / ED Diagnoses   Final diagnoses:  None    New Prescriptions New Prescriptions   No medications on file     Joycie Peek, PA-C 12/10/15 1535    Laurence Spates, MD 12/10/15 [phone]

## 2015-12-10 NOTE — ED Notes (Signed)
Pt is refusing treatment and states that she wants to leave.  Charge Nurse is calling her a cab. AD made aware.

## 2015-12-10 NOTE — ED Notes (Signed)
PT IS REQUESTING TO LEAVE AGAINST MEDICAL ADVICE STATING, "I DON'T LIKE THIS PLACE. I DON'T LIKE THE DOCTOR. I ASKED YOU GUYS FOR SOME FOOD,A ND YOU DIDN'T GIVE IT TO ME." PA BENJAMIN MADE AWARE.

## 2015-12-11 NOTE — Progress Notes (Deleted)
   [facility] Phone: [phone]   Date of Visit: 12/12/2015   HPI:  - patient was seen in the ED on 12/10/15 for abdominal distension, but left AMA prior to completion of workup and exam.   ROS: See HPI.  PMFSH:  PMH:  Alcoholic Cirrhosis GERD Hyperprolactinemia MS Peripheral Neuropathy Osteoporosis Alcohol Use Disorder Nicotine Use HLD Bipolar I Disorder   PHYSICAL EXAM: There were no vitals taken for this visit. Gen: *** HEENT: *** Heart: *** Lungs: *** Neuro: *** Ext: ***  ASSESSMENT/PLAN:  Health maintenance:  -***  No problem-specific Assessment & Plan notes found for this encounter.  FOLLOW UP: Follow up in *** for ***  [name], MD PGY 2 [facility]

## 2015-12-12 ENCOUNTER — Encounter (HOSPITAL_COMMUNITY): Payer: Self-pay | Admitting: Emergency Medicine

## 2015-12-12 ENCOUNTER — Emergency Department (HOSPITAL_COMMUNITY): Payer: Managed Care, Other (non HMO)

## 2015-12-12 ENCOUNTER — Observation Stay (HOSPITAL_COMMUNITY)
Admission: EM | Admit: 2015-12-12 | Discharge: 2015-12-12 | Discharge status: Against Medical Advice | Payer: Managed Care, Other (non HMO) | Attending: Family Medicine | Admitting: Family Medicine

## 2015-12-12 ENCOUNTER — Ambulatory Visit: Payer: Self-pay | Admitting: Internal Medicine

## 2015-12-12 DIAGNOSIS — K7011 Alcoholic hepatitis with ascites: Secondary | ICD-10-CM | POA: Diagnosis not present

## 2015-12-12 DIAGNOSIS — E43 Unspecified severe protein-calorie malnutrition: Secondary | ICD-10-CM | POA: Diagnosis not present

## 2015-12-12 DIAGNOSIS — G43909 Migraine, unspecified, not intractable, without status migrainosus: Secondary | ICD-10-CM | POA: Insufficient documentation

## 2015-12-12 DIAGNOSIS — R109 Unspecified abdominal pain: Secondary | ICD-10-CM | POA: Diagnosis not present

## 2015-12-12 DIAGNOSIS — G35 Multiple sclerosis: Secondary | ICD-10-CM | POA: Diagnosis not present

## 2015-12-12 DIAGNOSIS — F909 Attention-deficit hyperactivity disorder, unspecified type: Secondary | ICD-10-CM | POA: Insufficient documentation

## 2015-12-12 DIAGNOSIS — F319 Bipolar disorder, unspecified: Secondary | ICD-10-CM | POA: Diagnosis not present

## 2015-12-12 DIAGNOSIS — M1990 Unspecified osteoarthritis, unspecified site: Secondary | ICD-10-CM | POA: Insufficient documentation

## 2015-12-12 DIAGNOSIS — K7040 Alcoholic hepatic failure without coma: Secondary | ICD-10-CM | POA: Insufficient documentation

## 2015-12-12 DIAGNOSIS — F419 Anxiety disorder, unspecified: Secondary | ICD-10-CM | POA: Diagnosis not present

## 2015-12-12 DIAGNOSIS — G609 Hereditary and idiopathic neuropathy, unspecified: Secondary | ICD-10-CM | POA: Insufficient documentation

## 2015-12-12 DIAGNOSIS — M810 Age-related osteoporosis without current pathological fracture: Secondary | ICD-10-CM | POA: Insufficient documentation

## 2015-12-12 DIAGNOSIS — G35D Multiple sclerosis, unspecified: Secondary | ICD-10-CM | POA: Diagnosis present

## 2015-12-12 DIAGNOSIS — F17210 Nicotine dependence, cigarettes, uncomplicated: Secondary | ICD-10-CM | POA: Insufficient documentation

## 2015-12-12 DIAGNOSIS — K219 Gastro-esophageal reflux disease without esophagitis: Secondary | ICD-10-CM | POA: Diagnosis not present

## 2015-12-12 DIAGNOSIS — R110 Nausea: Secondary | ICD-10-CM | POA: Diagnosis not present

## 2015-12-12 DIAGNOSIS — E785 Hyperlipidemia, unspecified: Secondary | ICD-10-CM | POA: Insufficient documentation

## 2015-12-12 DIAGNOSIS — Z9114 Patient's other noncompliance with medication regimen: Secondary | ICD-10-CM | POA: Insufficient documentation

## 2015-12-12 DIAGNOSIS — E876 Hypokalemia: Secondary | ICD-10-CM | POA: Insufficient documentation

## 2015-12-12 DIAGNOSIS — K7460 Unspecified cirrhosis of liver: Secondary | ICD-10-CM

## 2015-12-12 DIAGNOSIS — K7031 Alcoholic cirrhosis of liver with ascites: Secondary | ICD-10-CM | POA: Diagnosis not present

## 2015-12-12 DIAGNOSIS — F1510 Other stimulant abuse, uncomplicated: Secondary | ICD-10-CM | POA: Insufficient documentation

## 2015-12-12 DIAGNOSIS — F1020 Alcohol dependence, uncomplicated: Secondary | ICD-10-CM | POA: Insufficient documentation

## 2015-12-12 DIAGNOSIS — K644 Residual hemorrhoidal skin tags: Secondary | ICD-10-CM | POA: Diagnosis not present

## 2015-12-12 DIAGNOSIS — K625 Hemorrhage of anus and rectum: Secondary | ICD-10-CM | POA: Diagnosis not present

## 2015-12-12 DIAGNOSIS — Z9119 Patient's noncompliance with other medical treatment and regimen: Secondary | ICD-10-CM | POA: Insufficient documentation

## 2015-12-12 DIAGNOSIS — D539 Nutritional anemia, unspecified: Secondary | ICD-10-CM | POA: Insufficient documentation

## 2015-12-12 DIAGNOSIS — E221 Hyperprolactinemia: Secondary | ICD-10-CM | POA: Diagnosis not present

## 2015-12-12 LAB — CBC WITH DIFFERENTIAL/PLATELET
BASOS ABS: 0 10*3/uL (ref 0.0–0.1)
BASOS PCT: 0 %
EOS ABS: 0 10*3/uL (ref 0.0–0.7)
Eosinophils Relative: 0 %
HCT: 35.4 % — ABNORMAL LOW (ref 36.0–46.0)
HEMOGLOBIN: 11.7 g/dL — AB (ref 12.0–15.0)
Lymphocytes Relative: 13 %
Lymphs Abs: 1 10*3/uL (ref 0.7–4.0)
MCH: 31 pg (ref 26.0–34.0)
MCHC: 33.1 g/dL (ref 30.0–36.0)
MCV: 93.7 fL (ref 78.0–100.0)
MONOS PCT: 7 %
Monocytes Absolute: 0.5 10*3/uL (ref 0.1–1.0)
NEUTROS PCT: 80 %
Neutro Abs: 6 10*3/uL (ref 1.7–7.7)
Platelets: 134 10*3/uL — ABNORMAL LOW (ref 150–400)
RBC: 3.78 MIL/uL — ABNORMAL LOW (ref 3.87–5.11)
RDW: 15.7 % — AB (ref 11.5–15.5)
WBC: 7.6 10*3/uL (ref 4.0–10.5)

## 2015-12-12 LAB — COMPREHENSIVE METABOLIC PANEL
ALBUMIN: 3 g/dL — AB (ref 3.5–5.0)
ALK PHOS: 169 U/L — AB (ref 38–126)
ALT: 24 U/L (ref 14–54)
ANION GAP: 13 (ref 5–15)
AST: 60 U/L — ABNORMAL HIGH (ref 15–41)
BUN: <5 mg/dL — ABNORMAL LOW (ref 6–20)
CO2: 26 mmol/L (ref 22–32)
Calcium: 8.2 mg/dL — ABNORMAL LOW (ref 8.9–10.3)
Chloride: 96 mmol/L — ABNORMAL LOW (ref 101–111)
Creatinine, Ser: 0.46 mg/dL (ref 0.44–1.00)
GFR calc Af Amer: >60 mL/min (ref >60)
GFR calc non Af Amer: >60 mL/min (ref >60)
GLUCOSE: 88 mg/dL (ref 65–99)
POTASSIUM: 3.2 mmol/L — AB (ref 3.5–5.1)
SODIUM: 135 mmol/L (ref 135–145)
Total Bilirubin: 2.7 mg/dL — ABNORMAL HIGH (ref 0.3–1.2)
Total Protein: 6.5 g/dL (ref 6.5–8.1)

## 2015-12-12 LAB — LIPASE, BLOOD: LIPASE: 21 U/L (ref 11–51)

## 2015-12-12 LAB — PROTIME-INR
INR: 1.5
Prothrombin Time: 18.3 seconds — ABNORMAL HIGH (ref 11.4–15.2)

## 2015-12-12 LAB — POC OCCULT BLOOD, ED: FECAL OCCULT BLD: POSITIVE — AB

## 2015-12-12 LAB — AMMONIA: AMMONIA: 30 umol/L (ref 9–35)

## 2015-12-12 MED ORDER — LORAZEPAM 2 MG/ML IJ SOLN
2.0000 mg | Freq: Once | INTRAMUSCULAR | Status: AC
  Administered 2015-12-12: 2 mg via INTRAVENOUS
  Filled 2015-12-12: qty 1

## 2015-12-12 MED ORDER — ONDANSETRON 4 MG PO TBDP: ORAL_TABLET | ORAL | 0 refills | Status: DC

## 2015-12-12 MED ORDER — GI COCKTAIL ~~LOC~~
30.0000 mL | Freq: Once | ORAL | Status: DC
  Filled 2015-12-12: qty 30

## 2015-12-12 MED ORDER — ONDANSETRON HCL 4 MG/2ML IJ SOLN
4.0000 mg | Freq: Once | INTRAMUSCULAR | Status: AC
  Administered 2015-12-12: 4 mg via INTRAVENOUS
  Filled 2015-12-12: qty 2

## 2015-12-12 MED ORDER — LIDOCAINE VISCOUS 2 % MT SOLN: 15.0000 mL | Freq: Once | OROMUCOSAL | Status: DC

## 2015-12-12 MED ORDER — NICOTINE 21 MG/24HR TD PT24
21.0000 mg | MEDICATED_PATCH | Freq: Once | TRANSDERMAL | Status: DC
  Administered 2015-12-12: 21 mg via TRANSDERMAL
  Filled 2015-12-12: qty 1

## 2015-12-12 MED ORDER — METHYLPREDNISOLONE SODIUM SUCC 125 MG IJ SOLR
125.0000 mg | Freq: Once | INTRAMUSCULAR | Status: AC
  Administered 2015-12-12: 125 mg via INTRAVENOUS
  Filled 2015-12-12: qty 2

## 2015-12-12 MED ORDER — SODIUM CHLORIDE 0.9 % IV BOLUS (SEPSIS)
1000.0000 mL | Freq: Once | INTRAVENOUS | Status: AC
  Administered 2015-12-12: 1000 mL via INTRAVENOUS

## 2015-12-12 MED ORDER — LIDOCAINE HCL (PF) 1 % IJ SOLN
INTRAMUSCULAR | Status: AC
  Filled 2015-12-12: qty 10

## 2015-12-12 NOTE — ED Notes (Signed)
Pt appears very anxious and having noticeable tremors that she reports began last night.

## 2015-12-12 NOTE — ED Provider Notes (Signed)
[facility]-EMERGENCY DEPT Provider Note   CSN: [id number] Arrival date & time: 12/12/15  1006     History   Chief Complaint Chief Complaint  Patient presents with  . Abdominal Pain  . Nausea    HPI Anne Black is a 55 y.o. female with a hx of MS, bipolar disorder, ETOH abuse, and cirrhosis that presents to the ED noting a 2-3 week history of progressively worsening MS flare symptoms of tingling sensations in her legs, arms and face, similar to that which she has had in the past. She states that these symptoms have been gradually worsening making it difficult to ambulate without assistance. The patient also notes ETOH abuse recently and has been noncompliant with her medications as a result. Last drink was about 3-4 days prior and she noted the onset of tremors last night. She states that it feels like prior withdrawal symptoms that she has had in the past and only further worsens her difficulty ambulating. She denies any other substance abuse of illicit drugs or drugs that aren't rx'd to her.   Additionally she states that her abdomen has been gradually distending and states that it has been since September. She has generalized abdominal pain as a result of this, which she states is chronic, gradually worsening over time as her stomach distends, and some nausea, but no vomiting or diarrhea. She has had an episode of small volume BRBPR when wiping after a bowel movement yesterday. No hx of significant GI bleeding, variceal bleeding. Uncertain of hx of hemorrhoids. She denies any hematemesis, light headedness, syncope, chest pain, shortness of breath.   HPI  Past Medical History:  Diagnosis Date  . ADHD (attention deficit hyperactivity disorder)   . Anxiety   . Arthritis   . Ascites   . Bipolar disorder (HCC)   . Cirrhosis (HCC)   . Hypokalemia   . Migraine   . Multiple sclerosis (HCC)   . Osteoporosis     Patient Active Problem List   Diagnosis Date Noted  . Multiple sclerosis  exacerbation (HCC) 12/12/2015  . Acute alcoholism (HCC)   . Abdominal distension 11/04/2015  . Alcohol abuse   . Shortness of breath   . Acute respiratory failure (HCC) 10/15/2015  . Ascites 10/15/2015  . Osteoporosis   . Cirrhosis (HCC)   . Bipolar I disorder (HCC)   . Multiple sclerosis (HCC)   . Alcoholic cirrhosis of liver with ascites (HCC) 08/22/2015  . Bipolar disorder, current episode mixed, moderate (HCC) 08/01/2015  . Alcohol use disorder, severe, dependence (HCC) 07/31/2015  . Alcoholic cirrhosis (HCC) 05/31/2015  . Alcoholic hepatitis with ascites 05/31/2015  . Macrocytic anemia- due to alcohol abuse with normal B12 & folate levels 05/31/2015  . Severe protein-calorie malnutrition (HCC) 05/31/2015  . Hypokalemia 05/26/2015  . Encephalopathy, hepatic (HCC) 05/26/2015  . Anxiety 05/18/2015  . Sepsis (HCC) 05/18/2015  . UTI (lower urinary tract infection) 05/18/2015  . Stimulant abuse 12/27/2014  . Nicotine abuse 12/27/2014  . Hyperprolactinemia (HCC) 11/15/2014  . Dyslipidemia   . Noncompliance with therapeutic plan 04/04/2013  . Unspecified hereditary and idiopathic peripheral neuropathy 05/01/2012  . GERD (gastroesophageal reflux disease) 05/01/2012  . Osteoarthrosis, unspecified whether generalized or localized, involving lower leg 05/01/2012  . MS (multiple sclerosis) (HCC) 09/14/2011    Past Surgical History:  Procedure Laterality Date  . CESAREAN SECTION  [id number]  . MYRINGOTOMY WITH TUBE PLACEMENT Bilateral   . TONSILLECTOMY      OB History    No data  available       Home Medications    Prior to Admission medications   Medication Sig Start Date End Date Taking? Authorizing Provider  albuterol (PROVENTIL HFA;VENTOLIN HFA) 108 (90 Base) MCG/ACT inhaler Inhale 2 puffs into the lungs every 6 (six) hours as needed for wheezing or shortness of breath.    Historical Provider, MD  amantadine (SYMMETREL) 100 MG capsule Take 1 capsule (100 mg total) by  mouth 2 (two) times daily. 08/04/15   Oneta Rack, NP  baclofen (LIORESAL) 10 MG tablet Take 1 tablet (10 mg total) by mouth 3 (three) times daily as needed for muscle spasms. 10/24/15   Garth Bigness, MD  feeding supplement, ENSURE ENLIVE, (ENSURE ENLIVE) LIQD Take 237 mLs by mouth 2 (two) times daily between meals. 10/16/15   Garth Bigness, MD  feeding supplement, ENSURE ENLIVE, (ENSURE ENLIVE) LIQD Take 237 mLs by mouth daily. 10/25/15   Garth Bigness, MD  FLUoxetine (PROZAC) 20 MG capsule Take 20 mg by mouth daily.     Historical Provider, MD  Fluticasone-Salmeterol (ADVAIR) 250-50 MCG/DOSE AEPB Inhale 1 puff into the lungs 2 (two) times daily.    Historical Provider, MD  folic acid (FOLVITE) 1 MG tablet Take 1 tablet (1 mg total) by mouth daily. 08/30/15   Barnetta Chapel, MD  furosemide (LASIX) 40 MG tablet Take 1 tablet (40 mg total) by mouth daily. 08/30/15   Barnetta Chapel, MD  gabapentin (NEURONTIN) 100 MG capsule Take 2 capsules (200 mg total) by mouth 3 (three) times daily. 08/30/15   Barnetta Chapel, MD  lactulose (CHRONULAC) 10 GM/15ML solution Take 30 mLs (20 g total) by mouth 2 (two) times daily. 08/30/15   Barnetta Chapel, MD  Melatonin 3 MG TABS Take 1 tablet (3 mg total) by mouth at bedtime as needed (sleep). 10/24/15   Garth Bigness, MD  miconazole (MICOTIN) 100 MG vaginal suppository Place 1 suppository (100 mg total) vaginally at bedtime. 10/25/15   Garth Bigness, MD  Multiple Vitamin (MULTIVITAMIN WITH MINERALS) TABS tablet Take 1 tablet by mouth daily. 08/30/15   Barnetta Chapel, MD  nicotine (NICODERM CQ - DOSED IN MG/24 HOURS) 21 mg/24hr patch Place 1 patch (21 mg total) onto the skin daily. 08/04/15   Oneta Rack, NP  ondansetron (ZOFRAN ODT) 4 MG disintegrating tablet 4mg  ODT q4 hours prn nausea/vomit 12/12/15   Blane Ohara, MD  pantoprazole (PROTONIX) 40 MG tablet Take 40 mg by mouth daily.    Historical Provider, MD  polyethylene glycol  (MIRALAX / GLYCOLAX) packet Take 17 g by mouth daily as needed for mild constipation. 10/16/15   Garth Bigness, MD  potassium chloride 20 MEQ TBCR Take 40 mEq by mouth daily. 10/16/15   Garth Bigness, MD  rifaximin (XIFAXAN) 550 MG TABS tablet Take 1 tablet (550 mg total) by mouth 2 (two) times daily. 06/05/15   Catarina Hartshorn, MD  risperiDONE (RISPERDAL) 0.5 MG tablet Take 1 tablet (0.5 mg total) by mouth at bedtime. 08/04/15   Oneta Rack, NP  spironolactone (ALDACTONE) 100 MG tablet Take 1 tablet (100 mg total) by mouth daily. Patient not taking: Reported on 10/15/2015 08/30/15   Barnetta Chapel, MD  topiramate (TOPAMAX) 100 MG tablet Take 1 tablet (100 mg total) by mouth daily. 02/21/15   Thermon Leyland, NP    Family History Family History  Problem Relation Age of Onset  . Heart disease Mother   . Heart disease Father   . Hypertension  Father     Social History Social History  Substance Use Topics  . Smoking status: Current Every Day Smoker    Packs/day: 0.75    Years: 20.00    Types: Cigarettes  . Smokeless tobacco: Never Used     Comment: is cutting back, not in the car, house  . Alcohol use 4.8 oz/week    2 Glasses of wine, 2 Cans of beer, 4 Standard drinks or equivalent per week     Comment: 10/2015   " I drink occasionaly "     Allergies   Review of patient's allergies indicates no known allergies.   Review of Systems Review of Systems  Constitutional: Positive for activity change and appetite change. Negative for chills and fever.  Respiratory: Negative for cough, chest tightness and shortness of breath.   Cardiovascular: Negative for chest pain.  Gastrointestinal: Positive for abdominal distention, abdominal pain, blood in stool, nausea and rectal pain. Negative for constipation, diarrhea and vomiting.  Genitourinary: Negative for dysuria, frequency, hematuria and urgency.  Musculoskeletal: Negative for arthralgias and myalgias.  Skin: Negative for rash and wound.   Neurological: Positive for tremors and numbness (tingling sensations in her legs, face, and arms 2/2 MS). Negative for dizziness, syncope, facial asymmetry, speech difficulty, weakness, light-headedness and headaches.  All other systems reviewed and are negative.    Physical Exam Updated Vital Signs BP 133/62   Pulse 106   Temp 98.4 F (36.9 C) (Oral)   Resp 22   Ht 5\' 2"  (1.575 m)   Wt 77.1 kg   SpO2 93%   BMI 31.09 kg/m   Physical Exam  Constitutional: She is oriented to person, place, and time. No distress.  Disheveled appearing, anxious  HENT:  Head: Normocephalic and atraumatic.  Nose: Nose normal.  MM dry, oropharynx clear   Eyes: Conjunctivae and EOM are normal. Pupils are equal, round, and reactive to light.  Neck: Normal range of motion. Neck supple.  Cardiovascular: Regular rhythm, normal heart sounds and intact distal pulses.  Tachycardia present.   Pulmonary/Chest: Effort normal and breath sounds normal.  Abdominal: She exhibits distension. There is tenderness (generalized). There is no rigidity, no rebound, no guarding and no CVA tenderness. A hernia (umbillical) is present.  Genitourinary: Rectal exam shows external hemorrhoid, tenderness and guaiac positive stool (No frank blood or melena on rectal exam, trace positive.).  Musculoskeletal: She exhibits no edema or tenderness.  Neurological: She is alert and oriented to person, place, and time. She has normal strength. She displays tremor. A sensory deficit (slight sensory deficit noted in legs, forearms, and face.) is present. No cranial nerve deficit. GCS eye subscore is 4. GCS verbal subscore is 5. GCS motor subscore is 6.  Skin: Skin is warm and dry. No rash noted. She is not diaphoretic.  Nursing note and vitals reviewed.    ED Treatments / Results  Labs (all labs ordered are listed, but only abnormal results are displayed) Labs Reviewed  CBC WITH DIFFERENTIAL/PLATELET - Abnormal; Notable for the  following:       Result Value   RBC 3.78 (*)    Hemoglobin 11.7 (*)    HCT 35.4 (*)    RDW 15.7 (*)    Platelets 134 (*)    All other components within normal limits  COMPREHENSIVE METABOLIC PANEL - Abnormal; Notable for the following:    Potassium 3.2 (*)    Chloride 96 (*)    BUN <5 (*)    Calcium 8.2 (*)  Albumin 3.0 (*)    AST 60 (*)    Alkaline Phosphatase 169 (*)    Total Bilirubin 2.7 (*)    All other components within normal limits  PROTIME-INR - Abnormal; Notable for the following:    Prothrombin Time 18.3 (*)    All other components within normal limits  POC OCCULT BLOOD, ED - Abnormal; Notable for the following:    Fecal Occult Bld POSITIVE (*)    All other components within normal limits  URINE CULTURE  LIPASE, BLOOD  AMMONIA    EKG  EKG Interpretation None       Radiology US Paracentesis  Result Date: 12/12/2015 INDICATION: Recurrent ascites EXAM: ULTRASOUND-GUIDED PARACENTESIS COMPARISON:  Previous paracentesis. MEDICATIONS: 10 cc 1% lidocaine. COMPLICATIONS: None immediate. TECHNIQUE: Informed written consent was obtained from the patient after a discussion of the risks, benefits and alternatives to treatment. A timeout was performed prior to the initiation of the procedure. Initial ultrasound scanning demonstrates a large amount of ascites within the right lower abdominal quadrant. The right lower abdomen was prepped and draped in the usual sterile fashion. 1% lidocaine with epinephrine was used for local anesthesia. Under direct ultrasound guidance, a 19 gauge, 7-cm, Yueh catheter was introduced. An ultrasound image was saved for documentation purposed. The paracentesis was performed. The catheter was removed and a dressing was applied. The patient tolerated the procedure well without immediate post procedural complication. FINDINGS: A total of approximately 3.4 liters of yellow fluid was removed. Samples were sent to the laboratory as requested by the  clinical team. IMPRESSION: Successful ultrasound-guided paracentesis yielding 3.4 liters of peritoneal fluid. Read by: Robet Leu [facility] Electronically Signed   By: Judie Petit.  Shick M.D.   On: 12/12/2015 15:11    Procedures Procedures (including critical care time)  Medications Ordered in ED Medications  LORazepam (ATIVAN) injection 2 mg (2 mg Intravenous Given 12/12/15 1102)  sodium chloride 0.9 % bolus 1,000 mL (0 mLs Intravenous Stopped 12/12/15 1332)  ondansetron (ZOFRAN) injection 4 mg (4 mg Intravenous Given 12/12/15 1126)  methylPREDNISolone sodium succinate (SOLU-MEDROL) 125 mg/2 mL injection 125 mg (125 mg Intravenous Given 12/12/15 1158)  LORazepam (ATIVAN) injection 2 mg (2 mg Intravenous Given 12/12/15 1338)     Initial Impression / Assessment and Plan / ED Course  I have reviewed the triage vital signs and the nursing notes.  Pertinent labs & imaging results that were available during my care of the patient were reviewed by me and considered in my medical decision making (see chart for details).  Clinical Course   55 y.o. female with cirrhosis, ETOH abuse, and MS presents with withdrawal, MS flare, and BRBPR. Exam as above with external hemorrhoids noted and given small volume of BRB feel that this in combination with her coagulopathy from cirrhosis is the cause of her bleeding. Low suspicion for upper GI bleed.   Labs were drawn and returned showing mildly elevated LFTs. INR 1.5, Plt 134, Hg 11.7.   US Paracentesis was done to relieve her distention. Given no fever and insidious onset, low suspicion for SBP.   She was given zofran and ativan and her tremors and withdrawal sx significant improved. She was given IV fluids and a dose of solumedrol for her MS flare.   Given the patient's multiple complaints and difficulty walking the decision was made to admit to family medicine for further care and assessment as observation.  Final Clinical Impressions(s) / ED Diagnoses   Final  diagnoses:  Cirrhosis (HCC)  New Prescriptions Discharge Medication List as of 12/12/2015  4:18 PM    START taking these medications   Details  ondansetron (ZOFRAN ODT) 4 MG disintegrating tablet 4mg  ODT q4 hours prn nausea/vomit, Print         Francoise Ceo, DO 12/13/15 5621    Charlynne Pander, MD 12/15/15 1023

## 2015-12-12 NOTE — ED Notes (Signed)
Pt requesting again to go outside and smoke, this RN reminded patient that she cannot go outside and smoke while she is checked in as a patient and that she has a nicotine patch on. Pt stated "I am definitely not staying over night then."

## 2015-12-12 NOTE — ED Notes (Signed)
Pt put on bed pan

## 2015-12-12 NOTE — Progress Notes (Signed)
CSW attempted to engage with Patient at her bedside. Patient presently at ultrasound. CSW will attempt Patient when she returns.          Lance Muss, LCSW [facility] ED/41M Clinical Social Worker [phone]

## 2015-12-12 NOTE — ED Notes (Signed)
Admitting team at bedside, pt provided sandwich.

## 2015-12-12 NOTE — ED Notes (Signed)
Patient transported to Ultrasound 

## 2015-12-12 NOTE — ED Notes (Signed)
Patient refusing gi cocktail stating, "why can't I have more zofran?" this RN explained that patient just received zofran prior to arrival and it is too soon for another dose, pt stated, "then give me some phenergan." this RN stated to patient that the doctor did not order phenergan and wanted to try a gi cocktail first, pt then stated, "I will only take that if I can have something to drink." this RN advised I would speak with Dr. Yetta Barre about something to drink but since she is still having nausea she will probably need to remain NPO at this time. Pt stated to this RN "I dont know why you are being so difficult, I feel awful, I need something to drink." this RN advised I would speak with Dr. Yetta Barre regarding her request.

## 2015-12-12 NOTE — ED Notes (Signed)
Placed pt on bed pan.

## 2015-12-12 NOTE — ED Triage Notes (Addendum)
Pt arrives from home via [facility] for c/o abdominal pain and nausea since last night. Pt reports bright red blood in stool yesterday. Pt has hx of liver cirrhosis and last alcoholic drink was 3-4 days ago. Pt seen Wednesday at [facility] for same thing. Received 4mg  zofran en route.

## 2015-12-12 NOTE — Discharge Instructions (Signed)
Follow up as directed by family practice, return to the ER if you change your mind and would like further treatment.  If you were given medicines take as directed.  If you are on coumadin or contraceptives realize their levels and effectiveness is altered by many different medicines.  If you have any reaction (rash, tongues swelling, other) to the medicines stop taking and see a physician.    If your blood pressure was elevated in the ER make sure you follow up for management with a primary doctor or return for chest pain, shortness of breath or stroke symptoms.  Please follow up as directed and return to the ER or see a physician for new or worsening symptoms.  Thank you. Vitals:   12/12/15 1513 12/12/15 1515 12/12/15 1530 12/12/15 1600  BP:  118/84 121/97 133/62  Pulse: 102 109 106 97  Resp: 19     Temp:      TempSrc:      SpO2: 93% 94% 94% 97%  Weight:      Height:

## 2015-12-12 NOTE — Procedures (Signed)
   US guided RLQ paracentesis  3.4 Liters yellow fluid  Tolerated well sent for labs per MD

## 2015-12-12 NOTE — ED Notes (Signed)
Placed pt onto bedpan, pt did have some bloody stool. Nurse was notified.

## 2015-12-12 NOTE — ED Notes (Signed)
Patient requesting to go outside and smoke, this RN advised that I cannot let her do that but that we can give her a nicotine patch. Pt stated that would be fine. Pt also asking for something else for anxiety. Dr. Yetta Barre gave verbal order to this RN for 2mg  ativan IV and a nicotine patch. MD at bedside speaking with patient at this time.

## 2015-12-12 NOTE — ED Notes (Signed)
Patient requesting that this RN give her phenergan for her nausea. This RN advised the Doctor would have to order meds for patient after assessing her.

## 2015-12-13 ENCOUNTER — Telehealth: Payer: Self-pay | Admitting: Family Medicine

## 2015-12-13 NOTE — Telephone Encounter (Signed)
**  After Hours/ Emergency Line Call*  Anne Black calls to report that she is having anxiety secondary to ETOH withdrawal.  She notes that she wants to stop drinking.  She notes that she was seen in ED 11/03 but was not given Ativan for home use.  Upon further review, it appears that she was going to be admitted to hospital for ETOH withdrawal/ complications of cirrhosis but declined admission after paracentesis.  Endorsing anxiety, nausea.  Recommended that she return to ED for further care and possible admission.  She declines this and prefers to be seen in clinic for discussion and initiation of Antabuse/ Ativan.  She states, "I cannot wait until Thursday to see my PCP", "I missed my appt on Friday because I was in the ED".  Red flags discussed.  Will forward to PCP.  Patient scheduled with same day provider, Dr Leonides Schanz 11/6 @10am , per patient's request.  Anne Black. Nadine Counts, DO PGY-3, [facility] Family Medicine Residency

## 2015-12-15 ENCOUNTER — Telehealth: Payer: Self-pay | Admitting: Family Medicine

## 2015-12-15 ENCOUNTER — Encounter: Payer: Self-pay | Admitting: Family Medicine

## 2015-12-15 ENCOUNTER — Inpatient Hospital Stay
Admission: AD | Admit: 2015-12-15 | Payer: Managed Care, Other (non HMO) | Source: Ambulatory Visit | Admitting: Family Medicine

## 2015-12-15 ENCOUNTER — Ambulatory Visit (INDEPENDENT_AMBULATORY_CARE_PROVIDER_SITE_OTHER): Payer: Managed Care, Other (non HMO) | Admitting: Family Medicine

## 2015-12-15 VITALS — BP 111/54 | HR 83 | Temp 97.8°F | Wt 155.0 lb

## 2015-12-15 DIAGNOSIS — F10939 Alcohol use, unspecified with withdrawal, unspecified: Secondary | ICD-10-CM

## 2015-12-15 DIAGNOSIS — F10239 Alcohol dependence with withdrawal, unspecified: Secondary | ICD-10-CM | POA: Diagnosis not present

## 2015-12-15 DIAGNOSIS — F1014 Alcohol abuse with alcohol-induced mood disorder: Secondary | ICD-10-CM | POA: Diagnosis present

## 2015-12-15 DIAGNOSIS — R296 Repeated falls: Secondary | ICD-10-CM

## 2015-12-15 NOTE — Telephone Encounter (Signed)
Called the mother back who is with the patient now. Patient not agreeable to go to the hospital, she states that she can get [facility]. She does not want to go in the hospital. She is aware of all the risks such as seizures, DTs, and even death. The patient can verbalize all of this to me.  At one point she mentions possibly getting direct admitted tomorrow if necessary.  I state that this is not an option and that most likely in the future if she presents to our clinic requesting to withdraw from alcohol, direct admission may not be an option, she may have to go through the ED given her refusal to go now.   Patient's mother is very appreciative of all our help and notes her significant frustration at her daughter's behavior. She notes in the future, she hopes she will get both psychiatric and substance abuse help as she has often wondered about the patient's competence. I discussed that ideally the patient patient would get enrolled in a dual rehab in the future.   Looking over previous records, she was evaluated by psych in April 2017 after refusing meds and care and was deemed to have capacity.  I do believe the patient has capacity now (she can tell me the risks of detox at home and she can set up rehab [or lie about it]), however I feel her judgement is quite poor. Advised her to f/u in our clinic if she is not at the rehab in [place].  Joanna Puffrystal S. Larkin Alfred, MD [facility] Family Medicine Resident  12/15/2015, 4:14 PM

## 2015-12-15 NOTE — Assessment & Plan Note (Addendum)
Patient is presenting with request for alcohol withdrawal and would like Antabuse and Ativan. We discussed that there are certain people who would not do well with detoxification as an outpatient, and she states she certainly one of these people. She notes that she needs to be in a long-term care facility. There have been significant issues in the past with trying to get her into a facility due to needing multiple places AMA. I discussed importance of her needing to stay in one place if they are able to get her a bed. When I ask why she left [place] after 2 weeks, she states because she got "impatient".  She is not showing significant signs of withdraw currently, mild tremors with eyes closed and reported anxiety.  -The patient will be admitted to withdraw from alcohol and frequent falls. I'm unsure if falls are secondary to alcohol use, encephalopathy, or MS).  -After discussing with the inpatient team, the patient mother arrives and asks multiple questions. She is very concerned about placement and would like to try to find her long term placement somewhere, even if it is outside of [place]. -Mother also is interested in the patient's mental status, as she is worried that the alcohol has caused significant encephalopathy. -I would place her on CIWA. She's been on a Librium during previous admissions, therefore orders placed for stepdown unit. -I would consider getting LFTs and ammonia levels in the inpatient setting.  - would consider PT evaluation. Get care management/social work involved early for disposition planning. - consider MOCA vs other cognitive evaluation inpatient vs outpatient. - given recent fall, would consider imaging of left shoulder and CXR to evaluate for rib fracture.  **Update: Bed admissions stated it'd be a few hours before a bed was available, it was felt the patient was stable to be sent home with her mother to pack bags and return for admission when a bed was available.  The patient only had mild tremors with her eyes closed on our exam, vitals stable, no h/o hallucinations or seizures. It seems as though she may be in the very early stages of withdrawal (if that, unsure if tremor is baseline give MS), there was just the desire to withdraw and these frequent falls spurring her admission.  The pt requested Ativan to help her until then, we discussed that it would not be appropriate, her mother agreed.  Her home number was confirmed as well as her mother's cell phone number. These numbers were relayed to bed control. I also asked them about med/tele beds, however none of these are available either. I asked that bed control let our office and the patient know if there was not a bed available by 4pm (they believed there would be), at which time we'd call and have the patient proceed to the ED. Discussed with the inpatient team, Dr. Nelson Chimes, who will also follow up with bed admissions at 4pm and call and advise pt to to go the ED if no bed is available by that time.

## 2015-12-15 NOTE — Progress Notes (Signed)
Subjective: CC: alcohol cessation HPI: Patient is a 55 y.o. female with a past medical history of MS, alcoholism, cirrhosis presenting to clinic today for alcohol cessation.  Patient interested in antabuse for alcohol withdrawal. She would also like Ativan as well. Last drank yesterday.  She drinks 1/2 cup of vodka per day per her report (however she keeps changing the sizes of her cup when she shows me with her hands). She notes she had a paracentesis in the ED recently. She notes extreme nausea and anxiety currently. Her liver is hurting mostly on the R side. She is having difficulty controlling her bowels- she has bowel incontinence and then takes imodium and becomes constipate. This has been going on for several weeks.  She reports her alcohol withdrawal symptoms are tremors and extreme nausea. Notes tremors are not too bad right now as she drank yesterday. She knows she will not be able to get sober without assistance.  She notes that she's been able to go 2-3 weeks sober over the last year approximately 4 times. Never had hallucinations or seizures with withdrawing. She notes that she was in a rehab [place], but left 10 days ago  She needs rehabilitation for multiple things per her report. She also notes a broken R wrist (from a fall) that was previously diagnosed but painful. She states she fell a few days ago again (can't tell me when or the mechanism of the fall) and injured her L shoulder and L ribs. Is worried she could've injured herself.   Social History: lives alone, accompanied by her ex-husband.   ROS: All other systems reviewed and are negative.  Past Medical History Patient Active Problem List   Diagnosis Date Noted  . Alcohol withdrawal (HCC) 12/15/2015  . Multiple sclerosis exacerbation (HCC) 12/12/2015  . Acute alcoholism (HCC)   . Abdominal distension 11/04/2015  . Alcohol abuse   . Shortness of breath   . Acute respiratory failure (HCC)  10/15/2015  . Ascites 10/15/2015  . Osteoporosis   . Cirrhosis (HCC)   . Bipolar I disorder (HCC)   . Multiple sclerosis (HCC)   . Alcoholic cirrhosis of liver with ascites (HCC) 08/22/2015  . Bipolar disorder, current episode mixed, moderate (HCC) 08/01/2015  . Alcohol use disorder, severe, dependence (HCC) 07/31/2015  . Alcoholic cirrhosis (HCC) 05/31/2015  . Alcoholic hepatitis with ascites 05/31/2015  . Macrocytic anemia- due to alcohol abuse with normal B12 & folate levels 05/31/2015  . Severe protein-calorie malnutrition (HCC) 05/31/2015  . Hypokalemia 05/26/2015  . Encephalopathy, hepatic (HCC) 05/26/2015  . Anxiety 05/18/2015  . Sepsis (HCC) 05/18/2015  . UTI (lower urinary tract infection) 05/18/2015  . Stimulant abuse 12/27/2014  . Nicotine abuse 12/27/2014  . Hyperprolactinemia (HCC) 11/15/2014  . Dyslipidemia   . Noncompliance with therapeutic plan 04/04/2013  . Unspecified hereditary and idiopathic peripheral neuropathy 05/01/2012  . GERD (gastroesophageal reflux disease) 05/01/2012  . Osteoarthrosis, unspecified whether generalized or localized, involving lower leg 05/01/2012  . MS (multiple sclerosis) (HCC) 09/14/2011    Medications- reviewed and updated  Objective: Office vital signs reviewed. BP (!) 111/54   Pulse 83   Temp 97.8 F (36.6 C) (Oral)   Wt 155 lb (70.3 kg)   BMI 28.35 kg/m    Physical Examination:  General: Awake, alert, well- nourished, anxious Eyes: Conjunctiva non-injected. PERRL. Bruising around left eye and forehead.  Cardio: RRR, no m/r/g noted.  Pulm: No increased WOB.  CTAB, without wheezes, rhonchi or crackles  noted.  GI: +BS, soft, distended, diffusely tender to palpation. No rebound or guarding.  MSK: ambulates with a cane. Patient will not let me examine her L shoulder. Significant tenderness to the anterior left shoulder. Will not perform ROM. No bruising noted over L rib cage, pain with light palpation over ribcage, will no  let me examine it further. Mild tremors when eyes are closed, no tremors when eyes are open. No asterixis. Diffusely weak all over, 4+/5 strength in UE and LE b/l.   Assessment/Plan: Alcohol withdrawal (HCC) Patient is presenting with request for alcohol withdrawal and would like Antabuse and Ativan. We discussed that there are certain people who would not do well with detoxification as an outpatient, and she states she certainly one of these people. She notes that she needs to be in a long-term care facility. There have been significant issues in the past with trying to get her into a facility due to needing multiple places AMA. I discussed importance of her needing to stay in one place if they are able to get her a bed. When I ask why she left [facility] after 2 weeks, she states because she got "impatient".  She is not showing significant signs of withdraw currently, mild tremors with eyes closed and reported anxiety.  -The patient will be admitted to withdraw from alcohol and frequent falls. I'm unsure if falls are secondary to alcohol use, encephalopathy, or MS).  -After discussing with the inpatient team, the patient mother arrives and asks multiple questions. She is very concerned about placement and would like to try to find her long term placement somewhere, even if it is outside of [place]. -Mother also is interested in the patient's mental status, as she is worried that the alcohol has caused significant encephalopathy. -I would place her on CIWA. She's been on a Librium during previous admissions, therefore orders placed for stepdown unit. -I would consider getting LFTs and ammonia levels in the inpatient setting.  - would consider PT evaluation. Get care management/social work involved early for disposition planning. - consider MOCA vs other cognitive evaluation inpatient vs outpatient. - given recent fall, would consider imaging of left shoulder and CXR to evaluate for rib  fracture.  **Update: Bed admissions stated it'd be a few hours before a bed was available, it was felt the patient was stable to be sent home with her mother to pack bags and return for admission when a bed was available. The patient only had mild tremors with her eyes closed on our exam, vitals stable, no h/o hallucinations or seizures. It seems as though she may be in the very early stages of withdrawal (if that, unsure if tremor is baseline give MS), there was just the desire to withdraw and these frequent falls spurring her admission.  The pt requested Ativan to help her until then, we discussed that it would not be appropriate, her mother agreed.  Her home number was confirmed as well as her mother's cell phone number. These numbers were relayed to bed control. I also asked them about med/tele beds, however none of these are available either. I asked that bed control let our office and the patient know if there was not a bed available by 4pm (they believed there would be), at which time we'd call and have the patient proceed to the ED. Discussed with the inpatient team, Dr. Nelson Chimes, who will also follow up with bed admissions at 4pm and call and advise pt to to go the ED  if no bed is available by that time.   No orders of the defined types were placed in this encounter.   No orders of the defined types were placed in this encounter.   Joanna Puff PGY-3, [facility] Family Medicine

## 2015-12-15 NOTE — Telephone Encounter (Signed)
[place] call from [place]. Apparently patient has a bed at the hospital, however when bed control called to let her know, the patient stated she felt she'd wait until tomorrow.   Asha attempted to call pt twice without an answer. I attempted to call patient once with no answer. I then called her mother who the patient stated she'd be with this afternoon (who accompanied her to her appt this AM at the patient's request). Her mother states she's on the phone right now with the patient trying to urge her to follow through with the admission. She requests that we not give away her bed. I note that we cannot hold her bed indefinitely. We discuss the risks of her going against medical advice. Patient's mother voices clear understanding and states she's been telling her daughter the exact same thing.  I will call the patient's mother back in 20 minutes for a definite answer.

## 2015-12-16 ENCOUNTER — Telehealth: Payer: Self-pay | Admitting: Family Medicine

## 2015-12-16 NOTE — Telephone Encounter (Signed)
Called patient to see how she's doing. Patient doing "awful" due to her left shoulder pain and bruise on her left face present due to her recent fall. She doesn't mention withdrawing from alcohol at all.   She notes she can go to [facility] "at any time" but that they will not address her medical issues. We discuss her going to the ED for further evaluation and admission. She notes that she does not want to be in the hospital.  She asks me for pain medication for her shoulder. We discuss that this would not be good care. I again stress the importance of her going to the ED for evaluation and possible admission for alcohol withdraw if she's still interested in that course. Patient becomes a little frustrated that I will not give her enough pain medication to "last a few days" but ultimately thanks me for calling to check in with her.    I stress that our clinic and the ED are always options. We discuss the risk of withdrawal seizures, hallucinations, and even death once again.  Joanna Puffrystal S. Shea Kapur, MD [facility] Resident  12/16/2015, 12:56 PM

## 2015-12-17 ENCOUNTER — Telehealth: Payer: Self-pay | Admitting: Internal Medicine

## 2015-12-17 NOTE — Telephone Encounter (Signed)
Returned patient's call. She notes she would like a prescription for anxiety and pain medication.   I discuss once gain, it is not appropriate for me to prescribe her Ativan and pain medications. She doesn't understand why I can't do this and I note that it would not be in her best interest and it is not good medical care. I note she needs to have that shoulder pain worked up further (she was supposed to have this done when we were going to direct admit her) and she needs to withdrawal in a safe location such as a hospital (not a home alone).   She then asks if I can direct admit her. I  tell her I cannot direct admit her as it has been >48hrs since I last evaluated her and we need to 1) ensure that patient's are stable before we direct admit them and 2) ensure that the patient will show up when a bed is available which she failed to do 2 days ago.   I once again stress that it is important that she go to the ED for evaluation and possible admission so that we can better care for her. I stressed that she can make an appt with our clinic as well however she denies it at this time.  Joanna Puffrystal S. Mindie Rawdon, MD [facility] Family Medicine Resident  12/17/2015, 4:50 PM

## 2015-12-17 NOTE — Telephone Encounter (Signed)
Will forward to MD who saw patient as well as pcp.  They will likely need to contact CSW on this process.  Jazmin Hartsell,CMA

## 2015-12-17 NOTE — Telephone Encounter (Signed)
Pt called because she was seen by Dr. Leonides Schanzorsey yesterday. Dr Leonides Schanzorsey was going to get her admitted into the hospital for her alcohol problem. She has thought about this and now wants the doctor to admit her into a program ASAP.  Please call her and get this set up so she can turn herself in. jw

## 2015-12-18 ENCOUNTER — Emergency Department (HOSPITAL_COMMUNITY): Payer: Managed Care, Other (non HMO)

## 2015-12-18 ENCOUNTER — Observation Stay (HOSPITAL_COMMUNITY)
Admission: EM | Admit: 2015-12-18 | Discharge: 2015-12-19 | Discharge status: Against Medical Advice | Payer: Managed Care, Other (non HMO) | Attending: Family Medicine | Admitting: Family Medicine

## 2015-12-18 ENCOUNTER — Observation Stay (HOSPITAL_COMMUNITY): Payer: Managed Care, Other (non HMO)

## 2015-12-18 ENCOUNTER — Encounter (HOSPITAL_COMMUNITY): Payer: Self-pay | Admitting: General Practice

## 2015-12-18 DIAGNOSIS — E876 Hypokalemia: Secondary | ICD-10-CM

## 2015-12-18 DIAGNOSIS — R569 Unspecified convulsions: Secondary | ICD-10-CM | POA: Diagnosis not present

## 2015-12-18 DIAGNOSIS — Z8249 Family history of ischemic heart disease and other diseases of the circulatory system: Secondary | ICD-10-CM | POA: Insufficient documentation

## 2015-12-18 DIAGNOSIS — K7290 Hepatic failure, unspecified without coma: Secondary | ICD-10-CM | POA: Insufficient documentation

## 2015-12-18 DIAGNOSIS — G35 Multiple sclerosis: Secondary | ICD-10-CM

## 2015-12-18 DIAGNOSIS — F17210 Nicotine dependence, cigarettes, uncomplicated: Secondary | ICD-10-CM | POA: Insufficient documentation

## 2015-12-18 DIAGNOSIS — K219 Gastro-esophageal reflux disease without esophagitis: Secondary | ICD-10-CM | POA: Diagnosis not present

## 2015-12-18 DIAGNOSIS — K7011 Alcoholic hepatitis with ascites: Secondary | ICD-10-CM | POA: Insufficient documentation

## 2015-12-18 DIAGNOSIS — E221 Hyperprolactinemia: Secondary | ICD-10-CM | POA: Diagnosis not present

## 2015-12-18 DIAGNOSIS — G43909 Migraine, unspecified, not intractable, without status migrainosus: Secondary | ICD-10-CM | POA: Insufficient documentation

## 2015-12-18 DIAGNOSIS — F319 Bipolar disorder, unspecified: Secondary | ICD-10-CM | POA: Diagnosis not present

## 2015-12-18 DIAGNOSIS — D539 Nutritional anemia, unspecified: Secondary | ICD-10-CM | POA: Diagnosis not present

## 2015-12-18 DIAGNOSIS — K7031 Alcoholic cirrhosis of liver with ascites: Principal | ICD-10-CM

## 2015-12-18 DIAGNOSIS — K7460 Unspecified cirrhosis of liver: Secondary | ICD-10-CM

## 2015-12-18 DIAGNOSIS — Z79899 Other long term (current) drug therapy: Secondary | ICD-10-CM | POA: Diagnosis not present

## 2015-12-18 DIAGNOSIS — F909 Attention-deficit hyperactivity disorder, unspecified type: Secondary | ICD-10-CM | POA: Insufficient documentation

## 2015-12-18 DIAGNOSIS — F419 Anxiety disorder, unspecified: Secondary | ICD-10-CM | POA: Insufficient documentation

## 2015-12-18 DIAGNOSIS — G609 Hereditary and idiopathic neuropathy, unspecified: Secondary | ICD-10-CM | POA: Insufficient documentation

## 2015-12-18 DIAGNOSIS — F1510 Other stimulant abuse, uncomplicated: Secondary | ICD-10-CM | POA: Insufficient documentation

## 2015-12-18 DIAGNOSIS — Z9119 Patient's noncompliance with other medical treatment and regimen: Secondary | ICD-10-CM | POA: Diagnosis not present

## 2015-12-18 DIAGNOSIS — M1990 Unspecified osteoarthritis, unspecified site: Secondary | ICD-10-CM | POA: Insufficient documentation

## 2015-12-18 DIAGNOSIS — E43 Unspecified severe protein-calorie malnutrition: Secondary | ICD-10-CM | POA: Diagnosis not present

## 2015-12-18 DIAGNOSIS — F10239 Alcohol dependence with withdrawal, unspecified: Secondary | ICD-10-CM | POA: Insufficient documentation

## 2015-12-18 DIAGNOSIS — F1020 Alcohol dependence, uncomplicated: Secondary | ICD-10-CM

## 2015-12-18 DIAGNOSIS — M810 Age-related osteoporosis without current pathological fracture: Secondary | ICD-10-CM | POA: Insufficient documentation

## 2015-12-18 DIAGNOSIS — R112 Nausea with vomiting, unspecified: Secondary | ICD-10-CM

## 2015-12-18 DIAGNOSIS — E785 Hyperlipidemia, unspecified: Secondary | ICD-10-CM | POA: Diagnosis not present

## 2015-12-18 LAB — URINALYSIS, ROUTINE W REFLEX MICROSCOPIC
BILIRUBIN URINE: NEGATIVE
Glucose, UA: NEGATIVE mg/dL
Hgb urine dipstick: NEGATIVE
Ketones, ur: NEGATIVE mg/dL
LEUKOCYTES UA: NEGATIVE
NITRITE: NEGATIVE
PH: 7 (ref 5.0–8.0)
Protein, ur: NEGATIVE mg/dL
SPECIFIC GRAVITY, URINE: 1.01 (ref 1.005–1.030)

## 2015-12-18 LAB — CBC WITH DIFFERENTIAL/PLATELET
BASOS PCT: 0 %
Basophils Absolute: 0 10*3/uL (ref 0.0–0.1)
Eosinophils Absolute: 0 10*3/uL (ref 0.0–0.7)
Eosinophils Relative: 0 %
HEMATOCRIT: 35.4 % — AB (ref 36.0–46.0)
HEMOGLOBIN: 11.6 g/dL — AB (ref 12.0–15.0)
Lymphocytes Relative: 23 %
Lymphs Abs: 1.7 10*3/uL (ref 0.7–4.0)
MCH: 31.4 pg (ref 26.0–34.0)
MCHC: 32.8 g/dL (ref 30.0–36.0)
MCV: 95.7 fL (ref 78.0–100.0)
MONOS PCT: 8 %
Monocytes Absolute: 0.5 10*3/uL (ref 0.1–1.0)
NEUTROS ABS: 5 10*3/uL (ref 1.7–7.7)
NEUTROS PCT: 69 %
Platelets: 116 10*3/uL — ABNORMAL LOW (ref 150–400)
RBC: 3.7 MIL/uL — AB (ref 3.87–5.11)
RDW: 16.5 % — ABNORMAL HIGH (ref 11.5–15.5)
WBC: 7.2 10*3/uL (ref 4.0–10.5)

## 2015-12-18 LAB — PROTIME-INR
INR: 1.34
Prothrombin Time: 16.7 seconds — ABNORMAL HIGH (ref 11.4–15.2)

## 2015-12-18 LAB — RAPID URINE DRUG SCREEN, HOSP PERFORMED
Amphetamines: NOT DETECTED
Barbiturates: NOT DETECTED
Benzodiazepines: NOT DETECTED
Cocaine: NOT DETECTED
OPIATES: NOT DETECTED
TETRAHYDROCANNABINOL: NOT DETECTED

## 2015-12-18 LAB — COMPREHENSIVE METABOLIC PANEL
ALT: 38 U/L (ref 14–54)
ANION GAP: 14 (ref 5–15)
AST: 95 U/L — ABNORMAL HIGH (ref 15–41)
Albumin: 3 g/dL — ABNORMAL LOW (ref 3.5–5.0)
Alkaline Phosphatase: 189 U/L — ABNORMAL HIGH (ref 38–126)
BILIRUBIN TOTAL: 2.4 mg/dL — AB (ref 0.3–1.2)
CO2: 35 mmol/L — ABNORMAL HIGH (ref 22–32)
Calcium: 8.2 mg/dL — ABNORMAL LOW (ref 8.9–10.3)
Chloride: 88 mmol/L — ABNORMAL LOW (ref 101–111)
Creatinine, Ser: 0.5 mg/dL (ref 0.44–1.00)
GFR calc Af Amer: >60 mL/min (ref >60)
Glucose, Bld: 153 mg/dL — ABNORMAL HIGH (ref 65–99)
POTASSIUM: 2.4 mmol/L — AB (ref 3.5–5.1)
Sodium: 137 mmol/L (ref 135–145)
TOTAL PROTEIN: 6.5 g/dL (ref 6.5–8.1)

## 2015-12-18 LAB — POTASSIUM: Potassium: 2.4 mmol/L — CL (ref 3.5–5.1)

## 2015-12-18 LAB — LIPASE, BLOOD: LIPASE: 24 U/L (ref 11–51)

## 2015-12-18 LAB — MAGNESIUM: MAGNESIUM: 1.6 mg/dL — AB (ref 1.7–2.4)

## 2015-12-18 LAB — ETHANOL: ALCOHOL ETHYL (B): 193 mg/dL — AB (ref <5)

## 2015-12-18 MED ORDER — POLYETHYLENE GLYCOL 3350 17 G PO PACK: 17.0000 g | PACK | Freq: Every day | ORAL | Status: DC | PRN

## 2015-12-18 MED ORDER — ONDANSETRON 4 MG PO TBDP: 4.0000 mg | ORAL_TABLET | Freq: Three times a day (TID) | ORAL | Status: DC | PRN

## 2015-12-18 MED ORDER — MOMETASONE FURO-FORMOTEROL FUM 200-5 MCG/ACT IN AERO
2.0000 | INHALATION_SPRAY | Freq: Two times a day (BID) | RESPIRATORY_TRACT | Status: DC
  Administered 2015-12-18 – 2015-12-19 (×2): 2 via RESPIRATORY_TRACT
  Filled 2015-12-18: qty 8.8

## 2015-12-18 MED ORDER — SODIUM CHLORIDE 0.9 % IV SOLN
1000.0000 mg | Freq: Every day | INTRAVENOUS | Status: DC
  Administered 2015-12-18 – 2015-12-19 (×2): 1000 mg via INTRAVENOUS
  Filled 2015-12-18 (×2): qty 8

## 2015-12-18 MED ORDER — RIFAXIMIN 550 MG PO TABS
550.0000 mg | ORAL_TABLET | Freq: Two times a day (BID) | ORAL | Status: DC
  Administered 2015-12-18 – 2015-12-19 (×2): 550 mg via ORAL
  Filled 2015-12-18 (×3): qty 1

## 2015-12-18 MED ORDER — RISPERIDONE 0.5 MG PO TABS
0.5000 mg | ORAL_TABLET | Freq: Every day | ORAL | Status: DC
  Filled 2015-12-18: qty 1

## 2015-12-18 MED ORDER — SPIRONOLACTONE 25 MG PO TABS
100.0000 mg | ORAL_TABLET | Freq: Every day | ORAL | Status: DC
  Filled 2015-12-18 (×2): qty 4

## 2015-12-18 MED ORDER — ALBUTEROL SULFATE (2.5 MG/3ML) 0.083% IN NEBU: 2.5000 mg | INHALATION_SOLUTION | Freq: Four times a day (QID) | RESPIRATORY_TRACT | Status: DC | PRN

## 2015-12-18 MED ORDER — VITAMIN B-1 100 MG PO TABS
100.0000 mg | ORAL_TABLET | Freq: Every day | ORAL | Status: DC
  Administered 2015-12-18 – 2015-12-19 (×2): 100 mg via ORAL
  Filled 2015-12-18 (×2): qty 1

## 2015-12-18 MED ORDER — POTASSIUM CHLORIDE 2 MEQ/ML IV SOLN
30.0000 meq | INTRAVENOUS | Status: AC
  Administered 2015-12-18: 30 meq via INTRAVENOUS
  Filled 2015-12-18: qty 15

## 2015-12-18 MED ORDER — IBUPROFEN 400 MG PO TABS
400.0000 mg | ORAL_TABLET | Freq: Four times a day (QID) | ORAL | Status: DC | PRN
  Filled 2015-12-18: qty 1

## 2015-12-18 MED ORDER — FOLIC ACID 1 MG PO TABS
1.0000 mg | ORAL_TABLET | Freq: Every day | ORAL | Status: DC
  Administered 2015-12-18 – 2015-12-19 (×2): 1 mg via ORAL
  Filled 2015-12-18 (×2): qty 1

## 2015-12-18 MED ORDER — NICOTINE 7 MG/24HR TD PT24
7.0000 mg | MEDICATED_PATCH | Freq: Every day | TRANSDERMAL | Status: DC
  Administered 2015-12-18 – 2015-12-19 (×2): 7 mg via TRANSDERMAL
  Filled 2015-12-18 (×2): qty 1

## 2015-12-18 MED ORDER — AMANTADINE HCL 100 MG PO CAPS
100.0000 mg | ORAL_CAPSULE | Freq: Two times a day (BID) | ORAL | Status: DC
  Administered 2015-12-18 – 2015-12-19 (×2): 100 mg via ORAL
  Filled 2015-12-18 (×2): qty 1

## 2015-12-18 MED ORDER — ADULT MULTIVITAMIN W/MINERALS CH
1.0000 | ORAL_TABLET | Freq: Every day | ORAL | Status: DC
  Administered 2015-12-19: 1 via ORAL
  Filled 2015-12-18 (×2): qty 1

## 2015-12-18 MED ORDER — LORAZEPAM 2 MG/ML IJ SOLN
1.0000 mg | Freq: Once | INTRAMUSCULAR | Status: AC
  Administered 2015-12-18: 1 mg via INTRAVENOUS
  Filled 2015-12-18: qty 1

## 2015-12-18 MED ORDER — LORAZEPAM 2 MG/ML IJ SOLN: 1.0000 mg | Freq: Four times a day (QID) | INTRAMUSCULAR | Status: DC | PRN

## 2015-12-18 MED ORDER — ENOXAPARIN SODIUM 40 MG/0.4ML ~~LOC~~ SOLN
40.0000 mg | SUBCUTANEOUS | Status: DC
  Filled 2015-12-18: qty 0.4

## 2015-12-18 MED ORDER — TOPIRAMATE 100 MG PO TABS
100.0000 mg | ORAL_TABLET | Freq: Every day | ORAL | Status: DC
  Administered 2015-12-18 – 2015-12-19 (×2): 100 mg via ORAL
  Filled 2015-12-18 (×2): qty 1

## 2015-12-18 MED ORDER — POTASSIUM CHLORIDE CRYS ER 20 MEQ PO TBCR
40.0000 meq | EXTENDED_RELEASE_TABLET | Freq: Three times a day (TID) | ORAL | Status: DC
  Administered 2015-12-18 – 2015-12-19 (×2): 40 meq via ORAL
  Filled 2015-12-18 (×2): qty 2

## 2015-12-18 MED ORDER — PANTOPRAZOLE SODIUM 40 MG PO TBEC
40.0000 mg | DELAYED_RELEASE_TABLET | Freq: Every day | ORAL | Status: DC
  Administered 2015-12-18 – 2015-12-19 (×2): 40 mg via ORAL
  Filled 2015-12-18 (×2): qty 1

## 2015-12-18 MED ORDER — FUROSEMIDE 40 MG PO TABS
40.0000 mg | ORAL_TABLET | Freq: Every day | ORAL | Status: DC
  Administered 2015-12-19: 40 mg via ORAL
  Filled 2015-12-18 (×2): qty 1

## 2015-12-18 MED ORDER — GABAPENTIN 100 MG PO CAPS
200.0000 mg | ORAL_CAPSULE | Freq: Three times a day (TID) | ORAL | Status: DC
  Administered 2015-12-18 – 2015-12-19 (×3): 200 mg via ORAL
  Filled 2015-12-18 (×3): qty 2

## 2015-12-18 MED ORDER — THIAMINE HCL 100 MG/ML IJ SOLN: 100.0000 mg | Freq: Every day | INTRAMUSCULAR | Status: DC

## 2015-12-18 MED ORDER — MAGNESIUM SULFATE 2 GM/50ML IV SOLN
2.0000 g | Freq: Once | INTRAVENOUS | Status: AC
  Administered 2015-12-18: 2 g via INTRAVENOUS
  Filled 2015-12-18: qty 50

## 2015-12-18 MED ORDER — LORAZEPAM 1 MG PO TABS
1.0000 mg | ORAL_TABLET | Freq: Four times a day (QID) | ORAL | Status: DC | PRN
  Administered 2015-12-18 – 2015-12-19 (×3): 1 mg via ORAL
  Filled 2015-12-18 (×3): qty 1

## 2015-12-18 MED ORDER — FLUOXETINE HCL 20 MG PO CAPS
20.0000 mg | ORAL_CAPSULE | Freq: Every day | ORAL | Status: DC
  Administered 2015-12-18 – 2015-12-19 (×2): 20 mg via ORAL
  Filled 2015-12-18 (×2): qty 1

## 2015-12-18 MED ORDER — LACTULOSE 10 GM/15ML PO SOLN
20.0000 g | Freq: Two times a day (BID) | ORAL | Status: DC
  Filled 2015-12-18 (×2): qty 30

## 2015-12-18 NOTE — Progress Notes (Signed)
Pt c/o left rib cage and left breast pain, due to injury to previous fall. MD notified at 2359. No new orders placed at his time, pain relieved with repositioning.

## 2015-12-18 NOTE — Progress Notes (Signed)
CRITICAL VALUE ALERT  Critical value received:  Potassium 2.4  Date of notification:  12/18/15  Time of notification:  2137  Critical value read back:Yes.    Nurse who received alert:  Christean GriefIvana Tanush Drees  MD notified (1st page):  Craige CottaKirby, NP  Time of first page:  2138  MD notified (2nd page):  Time of second page:  Responding MD:  Murray HodgkinsYashika, MD  Time MD responded:  2140

## 2015-12-18 NOTE — ED Notes (Signed)
Pt calling out complaining on shaking all over and that she is claustrophobic. RN aware.

## 2015-12-18 NOTE — Telephone Encounter (Signed)
Pt called. She says she is sad and anxious. After talking to her, she decided to call 911 and be admitted to the hospital

## 2015-12-18 NOTE — ED Provider Notes (Signed)
[facility]-EMERGENCY DEPT Provider Note   CSN: [id number] Arrival date & time: 12/18/15  1028     History   Chief Complaint Chief Complaint  Patient presents with  . Extremity Pain    HPI Anne Black is a 55 y.o. female.  HPI Patient states she is here because her back and legs hurt. States it is her MS. States she is having trouble dealing with it and also has anxiety. States she uses alcohol to treat this. She drank some alcohol this morning. Reviewing family practice notes they've been attempting a direct admit for the last 2 days. Attempting for help with her detoxing off alcohol. Has had a history of cirrhosis 2. Continues to drink. Has had paracentesis per patient a week ago. States her abdomen never really got smaller. States her abdomen does hurt. States she is having trouble dealing with all of this.   Past Medical History:  Diagnosis Date  . ADHD (attention deficit hyperactivity disorder)   . Anxiety   . Arthritis   . Ascites   . Bipolar disorder (HCC)   . Cirrhosis (HCC)   . Hypokalemia   . Migraine   . Multiple sclerosis (HCC)   . Osteoporosis     Patient Active Problem List   Diagnosis Date Noted  . Alcohol withdrawal (HCC) 12/15/2015  . Multiple sclerosis exacerbation (HCC) 12/12/2015  . Acute alcoholism (HCC)   . Abdominal distension 11/04/2015  . Alcohol abuse   . Shortness of breath   . Acute respiratory failure (HCC) 10/15/2015  . Ascites 10/15/2015  . Osteoporosis   . Cirrhosis (HCC)   . Bipolar I disorder (HCC)   . Multiple sclerosis (HCC)   . Alcoholic cirrhosis of liver with ascites (HCC) 08/22/2015  . Bipolar disorder, current episode mixed, moderate (HCC) 08/01/2015  . Alcohol use disorder, severe, dependence (HCC) 07/31/2015  . Alcoholic cirrhosis (HCC) 05/31/2015  . Alcoholic hepatitis with ascites 05/31/2015  . Macrocytic anemia- due to alcohol abuse with normal B12 & folate levels 05/31/2015  . Severe protein-calorie malnutrition (HCC)  05/31/2015  . Hypokalemia 05/26/2015  . Encephalopathy, hepatic (HCC) 05/26/2015  . Anxiety 05/18/2015  . Sepsis (HCC) 05/18/2015  . UTI (lower urinary tract infection) 05/18/2015  . Stimulant abuse 12/27/2014  . Nicotine abuse 12/27/2014  . Hyperprolactinemia (HCC) 11/15/2014  . Dyslipidemia   . Noncompliance with therapeutic plan 04/04/2013  . Unspecified hereditary and idiopathic peripheral neuropathy 05/01/2012  . GERD (gastroesophageal reflux disease) 05/01/2012  . Osteoarthrosis, unspecified whether generalized or localized, involving lower leg 05/01/2012  . MS (multiple sclerosis) (HCC) 09/14/2011    Past Surgical History:  Procedure Laterality Date  . CESAREAN SECTION  [id number]  . MYRINGOTOMY WITH TUBE PLACEMENT Bilateral   . TONSILLECTOMY      OB History    No data available       Home Medications    Prior to Admission medications   Medication Sig Start Date End Date Taking? Authorizing Provider  albuterol (PROVENTIL HFA;VENTOLIN HFA) 108 (90 Base) MCG/ACT inhaler Inhale 2 puffs into the lungs every 6 (six) hours as needed for wheezing or shortness of breath.   Yes Historical Provider, MD  amantadine (SYMMETREL) 100 MG capsule Take 1 capsule (100 mg total) by mouth 2 (two) times daily. 08/04/15  Yes Oneta Rack, NP  baclofen (LIORESAL) 10 MG tablet Take 1 tablet (10 mg total) by mouth 3 (three) times daily as needed for muscle spasms. 10/24/15  Yes Garth Bigness, MD  feeding supplement, ENSURE  ENLIVE, (ENSURE ENLIVE) LIQD Take 237 mLs by mouth daily. 10/25/15  Yes Garth Bigness, MD  FLUoxetine (PROZAC) 20 MG capsule Take 20 mg by mouth daily.    Yes Historical Provider, MD  Fluticasone-Salmeterol (ADVAIR) 250-50 MCG/DOSE AEPB Inhale 1 puff into the lungs 2 (two) times daily.   Yes Historical Provider, MD  folic acid (FOLVITE) 1 MG tablet Take 1 tablet (1 mg total) by mouth daily. 08/30/15  Yes Barnetta Chapel, MD  furosemide (LASIX) 40 MG tablet Take 1  tablet (40 mg total) by mouth daily. 08/30/15  Yes Barnetta Chapel, MD  gabapentin (NEURONTIN) 100 MG capsule Take 2 capsules (200 mg total) by mouth 3 (three) times daily. 08/30/15  Yes Barnetta Chapel, MD  lactulose (CHRONULAC) 10 GM/15ML solution Take 30 mLs (20 g total) by mouth 2 (two) times daily. 08/30/15  Yes Barnetta Chapel, MD  Melatonin 3 MG TABS Take 1 tablet (3 mg total) by mouth at bedtime as needed (sleep). 10/24/15  Yes Garth Bigness, MD  miconazole (MICOTIN) 100 MG vaginal suppository Place 1 suppository (100 mg total) vaginally at bedtime. 10/25/15  Yes Garth Bigness, MD  Multiple Vitamin (MULTIVITAMIN WITH MINERALS) TABS tablet Take 1 tablet by mouth daily. 08/30/15  Yes Barnetta Chapel, MD  ondansetron (ZOFRAN ODT) 4 MG disintegrating tablet 4mg  ODT q4 hours prn nausea/vomit 12/12/15  Yes Blane Ohara, MD  pantoprazole (PROTONIX) 40 MG tablet Take 40 mg by mouth daily.   Yes Historical Provider, MD  polyethylene glycol (MIRALAX / GLYCOLAX) packet Take 17 g by mouth daily as needed for mild constipation. 10/16/15  Yes Garth Bigness, MD  potassium chloride 20 MEQ TBCR Take 40 mEq by mouth daily. 10/16/15  Yes Garth Bigness, MD  rifaximin (XIFAXAN) 550 MG TABS tablet Take 1 tablet (550 mg total) by mouth 2 (two) times daily. 06/05/15  Yes Catarina Hartshorn, MD  risperiDONE (RISPERDAL) 0.5 MG tablet Take 1 tablet (0.5 mg total) by mouth at bedtime. 08/04/15  Yes Oneta Rack, NP  topiramate (TOPAMAX) 100 MG tablet Take 1 tablet (100 mg total) by mouth daily. 02/21/15  Yes Thermon Leyland, NP  feeding supplement, ENSURE ENLIVE, (ENSURE ENLIVE) LIQD Take 237 mLs by mouth 2 (two) times daily between meals. Patient not taking: Reported on 12/18/2015 10/16/15   Garth Bigness, MD  nicotine (NICODERM CQ - DOSED IN MG/24 HOURS) 21 mg/24hr patch Place 1 patch (21 mg total) onto the skin daily. Patient not taking: Reported on 12/18/2015 08/04/15   Oneta Rack, NP  spironolactone  (ALDACTONE) 100 MG tablet Take 1 tablet (100 mg total) by mouth daily. Patient not taking: Reported on 12/18/2015 08/30/15   Barnetta Chapel, MD    Family History Family History  Problem Relation Age of Onset  . Heart disease Mother   . Heart disease Father   . Hypertension Father     Social History Social History  Substance Use Topics  . Smoking status: Current Every Day Smoker    Packs/day: 0.75    Years: 20.00    Types: Cigarettes  . Smokeless tobacco: Never Used     Comment: is cutting back, not in the car, house  . Alcohol use 4.8 oz/week    2 Glasses of wine, 2 Cans of beer, 4 Standard drinks or equivalent per week     Comment: 10/2015   " I drink occasionaly "     Allergies   Patient has no known allergies.   Review  of Systems Review of Systems  Constitutional: Positive for appetite change.  HENT: Negative for congestion.   Respiratory: Negative for shortness of breath.   Cardiovascular: Positive for leg swelling. Negative for chest pain.  Gastrointestinal: Positive for abdominal pain, nausea and vomiting.  Genitourinary: Negative for flank pain.  Musculoskeletal: Positive for back pain.  Skin: Negative for wound.  Neurological: Positive for weakness.  Psychiatric/Behavioral: Negative for confusion.     Physical Exam Updated Vital Signs BP 114/61   Pulse 98   Temp 98.3 F (36.8 C) (Oral)   Resp 18   SpO2 99%   Physical Exam  Constitutional: She appears well-developed.  HENT:  Head: Atraumatic.  Neck: Neck supple.  Cardiovascular: Normal rate.   Pulmonary/Chest: Effort normal. She has no wheezes.  Abdominal: She exhibits distension.  Some distention with reducible umbilical hernia. Somewhat diffuse mild tenderness.  Musculoskeletal: She exhibits edema.  Neurological: She is alert.  Skin: Skin is warm. Capillary refill takes less than 2 seconds.  Psychiatric: Her behavior is normal.     ED Treatments / Results  Labs (all labs ordered are  listed, but only abnormal results are displayed) Labs Reviewed  COMPREHENSIVE METABOLIC PANEL - Abnormal; Notable for the following:       Result Value   Potassium 2.4 (*)    Chloride 88 (*)    CO2 35 (*)    Glucose, Bld 153 (*)    BUN <5 (*)    Calcium 8.2 (*)    Albumin 3.0 (*)    AST 95 (*)    Alkaline Phosphatase 189 (*)    Total Bilirubin 2.4 (*)    All other components within normal limits  ETHANOL - Abnormal; Notable for the following:    Alcohol, Ethyl (B) 193 (*)    All other components within normal limits  PROTIME-INR - Abnormal; Notable for the following:    Prothrombin Time 16.7 (*)    All other components within normal limits  CBC WITH DIFFERENTIAL/PLATELET - Abnormal; Notable for the following:    RBC 3.70 (*)    Hemoglobin 11.6 (*)    HCT 35.4 (*)    RDW 16.5 (*)    Platelets 116 (*)    All other components within normal limits  URINALYSIS, ROUTINE W REFLEX MICROSCOPIC (NOT AT [facility]) - Abnormal; Notable for the following:    Color, Urine AMBER (*)    APPearance HAZY (*)    All other components within normal limits  RAPID URINE DRUG SCREEN, HOSP PERFORMED  LIPASE, BLOOD  MAGNESIUM    EKG  EKG Interpretation None       Radiology Dg Chest 2 View  Result Date: 12/18/2015 CLINICAL DATA:  Shortness of breath and back pain EXAM: CHEST  2 VIEW COMPARISON:  10/15/2015 FINDINGS: The heart size and mediastinal contours are within normal limits. Both lungs are clear. The visualized skeletal structures are unremarkable. IMPRESSION: No active cardiopulmonary disease. Electronically Signed   By: Alcide Clever M.D.   On: 12/18/2015 12:18    Procedures Procedures (including critical care time)  Medications Ordered in ED Medications  potassium chloride 30 mEq in sodium chloride 0.9 % 265 mL (KCL MULTIRUN) IVPB (30 mEq Intravenous Given 12/18/15 1350)  LORazepam (ATIVAN) injection 1 mg (1 mg Intravenous Given 12/18/15 1157)     Initial Impression / Assessment and  Plan / ED Course  I have reviewed the triage vital signs and the nursing notes.  Pertinent labs & imaging results that were available  during my care of the patient were reviewed by me and considered in my medical decision making (see chart for details).  Clinical Course     Patient with multiple complaints. Abdominal pain cirrhosis alcoholism leg pain. Family practice is attempted direct admit the last 2 days and patient was not willing to do it. Patient states now she is willing to do it. Also found to have hypokalemia. Labs overall reassuring otherwise.  Final Clinical Impressions(s) / ED Diagnoses   Final diagnoses:  Alcoholism (HCC)  Hypokalemia  Non-intractable vomiting with nausea, unspecified vomiting type    New Prescriptions New Prescriptions   No medications on file     Benjiman CoreNathan Suheyb Raucci, MD 12/18/15 1419

## 2015-12-18 NOTE — Telephone Encounter (Signed)
Pt called again. She is currently at the hospital receiving fluids.she didn't know if she would be admitted. She is concerned about her communication with her dr because she states she needs anxiety medicine and her dr will not give it to her.  I read her the office visit notes from Nov6 visit and there was no mention of anxiety in the notes.  She would like to talk to dr Leonides Schanz

## 2015-12-18 NOTE — ED Triage Notes (Signed)
Patient complains of ongoing worsening extremity pain and abdominal swelling.  She has a history of alcoholism, cirrhosis and MS.  Patient also states she feels she is unable to care for her self.  States she was told by her PCP that her doctor wanted to admit her but the patient is unsure for what she was to be admitted.  Patient normally drinks 2-3 cups of vodka per day, states she has had approximately 0.5 cup today.

## 2015-12-18 NOTE — H&P (Signed)
Family Medicine Teaching [facility] Admission History and Physical Service Pager: [phone]  Patient name: Anne ApplebaumCarla S Black Medical record number: [id number] Date of birth: [birth year] Age: 55 y.o. Gender: female  Primary Care Provider: Palma HolterKanishka G Gunadasa, MD Consultants: None Code Status: Full  Chief Complaint:  Pain in legs, back, head  Assessment and Plan: Anne Black is a 55 y.o. female presenting with abdominal distention as well as pain in her legs, back, and face which she believes are secondary to an MS flare. PMH is significant for alcohol cirrhosis with ascites, alcohol abuse, bipolar disorder, GERD, anxiety, MS.  Alcoholic cirrhosis with ascites: Diagnosed 05/2015 and had negative hepatitis panel and autoimmune labs at that time. Ethanol 193. PT 16.7 and INR 1.34. WBC 7.2. Likely transudative fluid caused by cirrosis as pt is afebrile and no leukocytosis. No history of SBP on prior paracentesis. Patient does have mild abdominal tenderness to palpation. Suspect pain most likely related to significant ascites on exam. Most recent paracentesis performed on 11/3 with 3.4L removed. No signs or symptoms of SBP at this time.  - diagnostic and therapeutic paracentesis in AM if symptoms or clinical picture worsens. - will order abdominal U/S - admit to telemetry, vitals signs per units  - continue spironolactone and lasix, monitor BPs  - restarted lactulose and rifaximin, monitor potassium  - Ibuprofen for pain prn  - PT consult   Multiple Sclerosis Flare: [facility]. Endorsing some back and leg pain.  - continue home symmetrel  - solucortef 1000 mg for MS x 3d for acute flare (11/9 - 11/11) - will reach out to the patient's neurologist in AM -neuro consult in AM  Hypokalemia: K 2.4 at admission. Likely secondary to EtOH use.  Mg low at 1.6, will replete and recheck tomorrow. -replete with 30 mEq IV potassium chloride - will recheck potassium this evening  and in AM - consider scheduling potassium  Alcohol abuse: Patient has been drinking large amounts of vodka (about 1 pint of vodka per day since recent discharge). Her last drink was this morning prior to admission. She has withdrawn from alcohol in the past with anxiety and nausea, has seized from withdrawal per chart review.  - CIWA protocol -may need to consider scheduling a benzo to control symptoms better - thiamine, folate, MVI   Bipolar disorder and anxiety: Pt has many admissions for IVC and psych management. On prozac 20 mg, gabapentin 200mg  TID, topamax 100mg  Qd, risperidone 0.5mg  QHS. -continue home meds  History of prolonged Qtc, mild - Qtc 499 on last EKG  - will order EKG - avoid Qtc prolonging medications  FEN/GI: Regular diet, SLIV, Protonix Prophylaxis: Lovenox  Disposition: Home  History of Present Illness:  Anne Black is a 55 y.o. female presenting with abdominal distension in the setting of alcoholic cirrhosis with ascites as well as pain in her legs, face, and back.  The patient indicates she has had abdominal pain associated with abdominal distension and worsening anxiety over the past two days.  She has a history of alcoholism, indicates she drinks a double shot of vodka multiple times per day. Last drink was this morning. [place] of which she completed two weeks, left the program early.  Patient acknowledges that she needs to quit drinking alcohol, stating her motivation is 7/10 to quit. She feels she needs to make changes in her life in order for this to happen.  Patient believes she  is going through an MS flare, endorses pain in her bilateral lower extremities, back, and face that have intensified in the last two days.  States she is unsure when she last had a flare. SHe indicates she's had weakness, instability, falls.  Feels gabapentin has not helping control her pain.   Review Of Systems: Per HPI with the following  additions: No chest pain or dyspnea, no N/V/D/C, no urinary symtpoms  ROS  Patient Active Problem List   Diagnosis Date Noted  . Alcohol withdrawal (HCC) 12/15/2015  . Multiple sclerosis exacerbation (HCC) 12/12/2015  . Acute alcoholism (HCC)   . Abdominal distension 11/04/2015  . Alcohol abuse   . Shortness of breath   . Acute respiratory failure (HCC) 10/15/2015  . Ascites 10/15/2015  . Osteoporosis   . Cirrhosis (HCC)   . Bipolar I disorder (HCC)   . Multiple sclerosis (HCC)   . Alcoholic cirrhosis of liver with ascites (HCC) 08/22/2015  . Bipolar disorder, current episode mixed, moderate (HCC) 08/01/2015  . Alcohol use disorder, severe, dependence (HCC) 07/31/2015  . Alcoholic cirrhosis (HCC) 05/31/2015  . Alcoholic hepatitis with ascites 05/31/2015  . Macrocytic anemia- due to alcohol abuse with normal B12 & folate levels 05/31/2015  . Severe protein-calorie malnutrition (HCC) 05/31/2015  . Hypokalemia 05/26/2015  . Encephalopathy, hepatic (HCC) 05/26/2015  . Anxiety 05/18/2015  . Sepsis (HCC) 05/18/2015  . UTI (lower urinary tract infection) 05/18/2015  . Stimulant abuse 12/27/2014  . Nicotine abuse 12/27/2014  . Hyperprolactinemia (HCC) 11/15/2014  . Dyslipidemia   . Noncompliance with therapeutic plan 04/04/2013  . Unspecified hereditary and idiopathic peripheral neuropathy 05/01/2012  . GERD (gastroesophageal reflux disease) 05/01/2012  . Osteoarthrosis, unspecified whether generalized or localized, involving lower leg 05/01/2012  . MS (multiple sclerosis) (HCC) 09/14/2011    Past Medical History: Past Medical History:  Diagnosis Date  . ADHD (attention deficit hyperactivity disorder)   . Anxiety   . Arthritis   . Ascites   . Bipolar disorder (HCC)   . Cirrhosis (HCC)   . Hypokalemia   . Migraine   . Multiple sclerosis (HCC)   . Osteoporosis     Past Surgical History: Past Surgical History:  Procedure Laterality Date  . CESAREAN SECTION  [id number]   . MYRINGOTOMY WITH TUBE PLACEMENT Bilateral   . TONSILLECTOMY      Social History: Social History  Substance Use Topics  . Smoking status: Current Every Day Smoker    Packs/day: 0.75    Years: 20.00    Types: Cigarettes  . Smokeless tobacco: Never Used     Comment: is cutting back, not in the car, house  . Alcohol use 4.8 oz/week    2 Glasses of wine, 2 Cans of beer, 4 Standard drinks or equivalent per week     Comment: 10/2015   " I drink occasionaly "    Family History: Family History  Problem Relation Age of Onset  . Heart disease Mother   . Heart disease Father   . Hypertension Father     Allergies and Medications: No Known Allergies No current facility-administered medications on file prior to encounter.    Current Outpatient Prescriptions on File Prior to Encounter  Medication Sig Dispense Refill  . albuterol (PROVENTIL HFA;VENTOLIN HFA) 108 (90 Base) MCG/ACT inhaler Inhale 2 puffs into the lungs every 6 (six) hours as needed for wheezing or shortness of breath.    Marland Kitchen amantadine (SYMMETREL) 100 MG capsule Take 1 capsule (100 mg total) by mouth 2 (  two) times daily. 30 capsule 0  . baclofen (LIORESAL) 10 MG tablet Take 1 tablet (10 mg total) by mouth 3 (three) times daily as needed for muscle spasms. 30 each 0  . feeding supplement, ENSURE ENLIVE, (ENSURE ENLIVE) LIQD Take 237 mLs by mouth daily. 237 mL 12  . FLUoxetine (PROZAC) 20 MG capsule Take 20 mg by mouth daily.   0  . Fluticasone-Salmeterol (ADVAIR) 250-50 MCG/DOSE AEPB Inhale 1 puff into the lungs 2 (two) times daily.    . folic acid (FOLVITE) 1 MG tablet Take 1 tablet (1 mg total) by mouth daily. 30 tablet 1  . furosemide (LASIX) 40 MG tablet Take 1 tablet (40 mg total) by mouth daily. 30 tablet 1  . gabapentin (NEURONTIN) 100 MG capsule Take 2 capsules (200 mg total) by mouth 3 (three) times daily. 90 capsule 0  . lactulose (CHRONULAC) 10 GM/15ML solution Take 30 mLs (20 g total) by mouth 2 (two) times daily.  240 mL 0  . Melatonin 3 MG TABS Take 1 tablet (3 mg total) by mouth at bedtime as needed (sleep). 30 tablet 0  . miconazole (MICOTIN) 100 MG vaginal suppository Place 1 suppository (100 mg total) vaginally at bedtime. 7 suppository 0  . Multiple Vitamin (MULTIVITAMIN WITH MINERALS) TABS tablet Take 1 tablet by mouth daily. 30 tablet 1  . ondansetron (ZOFRAN ODT) 4 MG disintegrating tablet 4mg  ODT q4 hours prn nausea/vomit 10 tablet 0  . pantoprazole (PROTONIX) 40 MG tablet Take 40 mg by mouth daily.    . polyethylene glycol (MIRALAX / GLYCOLAX) packet Take 17 g by mouth daily as needed for mild constipation. 14 each 0  . potassium chloride 20 MEQ TBCR Take 40 mEq by mouth daily. 60 tablet 0  . rifaximin (XIFAXAN) 550 MG TABS tablet Take 1 tablet (550 mg total) by mouth 2 (two) times daily. 60 tablet 0  . risperiDONE (RISPERDAL) 0.5 MG tablet Take 1 tablet (0.5 mg total) by mouth at bedtime. 30 tablet 0  . topiramate (TOPAMAX) 100 MG tablet Take 1 tablet (100 mg total) by mouth daily. 30 tablet 0  . feeding supplement, ENSURE ENLIVE, (ENSURE ENLIVE) LIQD Take 237 mLs by mouth 2 (two) times daily between meals. (Patient not taking: Reported on 12/18/2015) 237 mL 12  . nicotine (NICODERM CQ - DOSED IN MG/24 HOURS) 21 mg/24hr patch Place 1 patch (21 mg total) onto the skin daily. (Patient not taking: Reported on 12/18/2015) 28 patch 0  . spironolactone (ALDACTONE) 100 MG tablet Take 1 tablet (100 mg total) by mouth daily. (Patient not taking: Reported on 12/18/2015) 30 tablet 1    Objective: BP 118/68   Pulse 101   Temp 98.3 F (36.8 C) (Oral)   Resp 23   SpO2 100%  Exam: General: anxious-appearing, chronically-ill appearing female rests in bed, appears older than stated age Eyes: PERRL, EOMI, no conjunctival injection or pallor ENTM: no rhinorrhea, no pharyngeal erythema or exudate Neck: full ROM Cardiovascular: RRR, no m/r/g Respiratory: CTA bil, no W/R/R Gastrointestinal: +distension, +mild  tenderness to palpation, +tightness, normoactive BS appreciate MSK: full ROM in all extremities Derm: +spider angiomas appreciated over face and arms Neuro: CN II-XII grossly intact, non-focal Psych: AAOx3, +anxiety however thought process linear  Labs and Imaging: Results for orders placed or performed during the hospital encounter of 12/18/15 (from the past 24 hour(s))  Comprehensive metabolic panel     Status: Abnormal   Collection Time: 12/18/15 11:25 AM  Result Value Ref Range  Sodium 137 135 - 145 mmol/L   Potassium 2.4 (LL) 3.5 - 5.1 mmol/L   Chloride 88 (L) 101 - 111 mmol/L   CO2 35 (H) 22 - 32 mmol/L   Glucose, Bld 153 (H) 65 - 99 mg/dL   BUN <5 (L) 6 - 20 mg/dL   Creatinine, Ser 4.54 0.44 - 1.00 mg/dL   Calcium 8.2 (L) 8.9 - 10.3 mg/dL   Total Protein 6.5 6.5 - 8.1 g/dL   Albumin 3.0 (L) 3.5 - 5.0 g/dL   AST 95 (H) 15 - 41 U/L   ALT 38 14 - 54 U/L   Alkaline Phosphatase 189 (H) 38 - 126 U/L   Total Bilirubin 2.4 (H) 0.3 - 1.2 mg/dL   GFR calc non Af Amer >60 >60 mL/min   GFR calc Af Amer >60 >60 mL/min   Anion gap 14 5 - 15  Protime-INR     Status: Abnormal   Collection Time: 12/18/15 11:25 AM  Result Value Ref Range   Prothrombin Time 16.7 (H) 11.4 - 15.2 seconds   INR 1.34   CBC with Differential     Status: Abnormal   Collection Time: 12/18/15 11:25 AM  Result Value Ref Range   WBC 7.2 4.0 - 10.5 K/uL   RBC 3.70 (L) 3.87 - 5.11 MIL/uL   Hemoglobin 11.6 (L) 12.0 - 15.0 g/dL   HCT 09.8 (L) 11.9 - 14.7 %   MCV 95.7 78.0 - 100.0 fL   MCH 31.4 26.0 - 34.0 pg   MCHC 32.8 30.0 - 36.0 g/dL   RDW 82.9 (H) 56.2 - 13.0 %   Platelets 116 (L) 150 - 400 K/uL   Neutrophils Relative % 69 %   Neutro Abs 5.0 1.7 - 7.7 K/uL   Lymphocytes Relative 23 %   Lymphs Abs 1.7 0.7 - 4.0 K/uL   Monocytes Relative 8 %   Monocytes Absolute 0.5 0.1 - 1.0 K/uL   Eosinophils Relative 0 %   Eosinophils Absolute 0.0 0.0 - 0.7 K/uL   Basophils Relative 0 %   Basophils Absolute 0.0 0.0  - 0.1 K/uL  Lipase, blood     Status: None   Collection Time: 12/18/15 11:25 AM  Result Value Ref Range   Lipase 24 11 - 51 U/L  Magnesium     Status: Abnormal   Collection Time: 12/18/15 11:25 AM  Result Value Ref Range   Magnesium 1.6 (L) 1.7 - 2.4 mg/dL  Urine rapid drug screen (hosp performed)     Status: None   Collection Time: 12/18/15 11:44 AM  Result Value Ref Range   Opiates NONE DETECTED NONE DETECTED   Cocaine NONE DETECTED NONE DETECTED   Benzodiazepines NONE DETECTED NONE DETECTED   Amphetamines NONE DETECTED NONE DETECTED   Tetrahydrocannabinol NONE DETECTED NONE DETECTED   Barbiturates NONE DETECTED NONE DETECTED  Urinalysis, Routine w reflex microscopic     Status: Abnormal   Collection Time: 12/18/15 11:44 AM  Result Value Ref Range   Color, Urine AMBER (A) YELLOW   APPearance HAZY (A) CLEAR   Specific Gravity, Urine 1.010 1.005 - 1.030   pH 7.0 5.0 - 8.0   Glucose, UA NEGATIVE NEGATIVE mg/dL   Hgb urine dipstick NEGATIVE NEGATIVE   Bilirubin Urine NEGATIVE NEGATIVE   Ketones, ur NEGATIVE NEGATIVE mg/dL   Protein, ur NEGATIVE NEGATIVE mg/dL   Nitrite NEGATIVE NEGATIVE   Leukocytes, UA NEGATIVE NEGATIVE  Ethanol     Status: Abnormal   Collection Time: 12/18/15 12:27  PM  Result Value Ref Range   Alcohol, Ethyl (B) 193 (H) <5 mg/dL   Dg Chest 2 View  Result Date: 12/18/2015 CLINICAL DATA:  Shortness of breath and back pain EXAM: CHEST  2 VIEW COMPARISON:  10/15/2015 FINDINGS: The heart size and mediastinal contours are within normal limits. Both lungs are clear. The visualized skeletal structures are unremarkable. IMPRESSION: No active cardiopulmonary disease. Electronically Signed   By: Alcide Clever M.D.   On: 12/18/2015 12:18    Howard Pouch, MD 12/18/2015, 5:16 PM PGY-1, [facility] FPTS Intern pager: [phone], text pages welcome  FPTS Upper-Level Resident Addendum  I have independently interviewed and examined the patient. I have  discussed the above with the original author and agree with their documentation. My edits for correction/addition/clarification are in pink. Please see also any attending notes.   Pincus Large, DO PGY-2, [place] [facility] FPTS Service pager: [phone] (text pages welcome through [facility])

## 2015-12-18 NOTE — Progress Notes (Signed)
Pt turned in bed and pulled IV out, will administer meds as soon as new IV access is established.

## 2015-12-19 LAB — CBC
HCT: 33.3 % — ABNORMAL LOW (ref 36.0–46.0)
HEMOGLOBIN: 10.9 g/dL — AB (ref 12.0–15.0)
MCH: 31.6 pg (ref 26.0–34.0)
MCHC: 32.7 g/dL (ref 30.0–36.0)
MCV: 96.5 fL (ref 78.0–100.0)
Platelets: 84 10*3/uL — ABNORMAL LOW (ref 150–400)
RBC: 3.45 MIL/uL — AB (ref 3.87–5.11)
RDW: 16.7 % — ABNORMAL HIGH (ref 11.5–15.5)
WBC: 3.4 10*3/uL — AB (ref 4.0–10.5)

## 2015-12-19 LAB — COMPREHENSIVE METABOLIC PANEL
ALK PHOS: 175 U/L — AB (ref 38–126)
ALT: 33 U/L (ref 14–54)
ANION GAP: 9 (ref 5–15)
AST: 77 U/L — ABNORMAL HIGH (ref 15–41)
Albumin: 2.7 g/dL — ABNORMAL LOW (ref 3.5–5.0)
BILIRUBIN TOTAL: 4.6 mg/dL — AB (ref 0.3–1.2)
BUN: 5 mg/dL — ABNORMAL LOW (ref 6–20)
CALCIUM: 8.3 mg/dL — AB (ref 8.9–10.3)
CO2: 30 mmol/L (ref 22–32)
Chloride: 96 mmol/L — ABNORMAL LOW (ref 101–111)
Creatinine, Ser: 0.45 mg/dL (ref 0.44–1.00)
GFR calc non Af Amer: >60 mL/min (ref >60)
Glucose, Bld: 154 mg/dL — ABNORMAL HIGH (ref 65–99)
Potassium: 2.9 mmol/L — ABNORMAL LOW (ref 3.5–5.1)
SODIUM: 135 mmol/L (ref 135–145)
TOTAL PROTEIN: 5.7 g/dL — AB (ref 6.5–8.1)

## 2015-12-19 LAB — MRSA PCR SCREENING: MRSA BY PCR: NEGATIVE

## 2015-12-19 NOTE — Discharge Summary (Signed)
Family [facility] Discharge Summary  Patient name: Anne Black Medical record number: [id number] Date of birth: Nov 27, 1960 Age: 55 y.o. Gender: female Date of Admission: 12/18/2015  Date of Discharge: Against Medical Advice 12/19/2015 Admitting Physician: Latrelle DodrillBrittany J McIntyre, MD  Primary Care Provider: Palma HolterKanishka G Gunadasa, MD Consultants: None  Indication for Hospitalization: Worsened ascites, hypokalemia  Discharge Diagnoses/Problem List:  Patient Active Problem List   Diagnosis Date Noted  . Alcoholism (HCC)   . Non-intractable vomiting with nausea   . Alcohol withdrawal (HCC) 12/15/2015  . Multiple sclerosis exacerbation (HCC) 12/12/2015  . Acute alcoholism (HCC)   . Abdominal distension 11/04/2015  . Alcohol abuse   . Shortness of breath   . Acute respiratory failure (HCC) 10/15/2015  . Ascites 10/15/2015  . Osteoporosis   . Cirrhosis (HCC)   . Bipolar I disorder (HCC)   . Multiple sclerosis (HCC)   . Alcoholic cirrhosis of liver with ascites (HCC) 08/22/2015  . Bipolar disorder, current episode mixed, moderate (HCC) 08/01/2015  . Alcohol use disorder, severe, dependence (HCC) 07/31/2015  . Alcoholic cirrhosis (HCC) 05/31/2015  . Alcoholic hepatitis with ascites 05/31/2015  . Macrocytic anemia- due to alcohol abuse with normal B12 & folate levels 05/31/2015  . Severe protein-calorie malnutrition (HCC) 05/31/2015  . Hypokalemia 05/26/2015  . Encephalopathy, hepatic (HCC) 05/26/2015  . Anxiety 05/18/2015  . Sepsis (HCC) 05/18/2015  . UTI (lower urinary tract infection) 05/18/2015  . Stimulant abuse 12/27/2014  . Nicotine abuse 12/27/2014  . Hyperprolactinemia (HCC) 11/15/2014  . Dyslipidemia   . Noncompliance with therapeutic plan 04/04/2013  . Unspecified hereditary and idiopathic peripheral neuropathy 05/01/2012  . GERD (gastroesophageal reflux disease) 05/01/2012  . Osteoarthrosis, unspecified whether generalized or localized, involving  lower leg 05/01/2012  . MS (multiple sclerosis) (HCC) 09/14/2011     Disposition:  Patient left the hospital against medical advice.  Discharge Exam:  Temp:  [98 F (36.7 C)-99.4 F (37.4 C)] 98 F (36.7 C) (11/10 0644) Pulse Rate:  [74-104] 74 (11/10 0644) Resp:  [16-23] 16 (11/10 0644) BP: (93-123)/(48-78) 93/48 (11/10 0644) SpO2:  [93 %-100 %] 93 % (11/10 40980644) Physical Exam: General: anxious-appearing, chronically-ill appearing female rests in bed, appears older than stated age Cardiovascular: RRR, no m/r/g Respiratory: CTA bil, no W/R/R Abdomen: +distension, +mild tenderness to palpation, +tightness, normoactive BS appreciate Extremities: full ROM in all extremities  Brief Hospital Course:  Anne SarkCarla S [name] a 55 y.o.femalepresentingwith abdominal distention as well as pain in her legs, back, and face which she believes are secondary to an MS flare. PMH is significant for alcohol cirrhosis with ascites, alcohol abuse, bipolar disorder, GERD, anxiety, MS.  Patient admitted to the hospital with low potassium, elevated bilirubin, and worsening ascites as well as possible MS flare.   MS For the MS flare, the patient was noting numbness and tingling in her legs as well as pain in her legs,hips, shoulder, and face.  She said the symptoms had intensified over the past two days.    She was treated with 1000 mg solumedrol, plan was to give a 3 day course for MS flare.  Cirrhosis/Ascites For her worsening ascites and bilirubin increase, concern for worsening cirrhosis/possible SBP was noted.  Planned for diagnostic and therapeutic paracentesis of the ascitic fluid prior to patient leaving AMA. Bilirubin was elevated to 4.6 on the day of discharge.  Potassium low at 2.4 on admission, 2.9 when she left, we were in the process of repletion.   Alcohol Abuse Patient had  originally said over the phone she would like to detox in the hospital. On admission she did not endorse this as a  priority and cited her MS flare as her primary concern.  Patient was placed on CIWA overnight, scores were 15, 9, 9.  In the morning patient stated she wished to go home for her twin daughters 16th birthdays, had plans to call a [facility] facility where she had previously undergone treatment. Risks of leaving AMA, particularly with elevated bilirubin and with potentially fatal effects of withdrawing from alcohol at home were discussed with the patient. She demonstrated clear decision making capacity at the time she left the hospital.   Issues for Follow Up:  1. Patient's bilirubin was found to be elevated to 4.6.  Potassium still low at 2.9 when she signed herself out of the hospital. Please follow up. 2. 3.4L ascitic fluid had been removed by paracentesis 1 week before this admission. Fluid appears to have re-accumulated on this admission, SBP was considered.  Unable to undergo paracentesis this admission due to patient leaving AMA. 3. Patient has stated she would like to discontinue alcohol use, has noted prior treatment at a [facility] rehabilitation center. Further counseling and treatment will be required to help this patient reach her goals.  Significant Procedures: None  Significant Labs and Imaging:   Recent Labs Lab 12/18/15 1125 12/19/15 0547  WBC 7.2 3.4*  HGB 11.6* 10.9*  HCT 35.4* 33.3*  PLT 116* 84*    Recent Labs Lab 12/18/15 1125 12/18/15 2108 12/19/15 0547  NA 137  --  135  K 2.4* 2.4* 2.9*  CL 88*  --  96*  CO2 35*  --  30  GLUCOSE 153*  --  154*  BUN <5*  --  5*  CREATININE 0.50  --  0.45  CALCIUM 8.2*  --  8.3*  MG 1.6*  --   --   ALKPHOS 189*  --  175*  AST 95*  --  77*  ALT 38  --  33  ALBUMIN 3.0*  --  2.7*    Results/Tests Pending at Time of Discharge: None  Discharge Medications:    Medication List    ASK your doctor about these medications   albuterol 108 (90 Base) MCG/ACT inhaler Commonly known as:  PROVENTIL HFA;VENTOLIN HFA Inhale  2 puffs into the lungs every 6 (six) hours as needed for wheezing or shortness of breath.   amantadine 100 MG capsule Commonly known as:  SYMMETREL Take 1 capsule (100 mg total) by mouth 2 (two) times daily.   baclofen 10 MG tablet Commonly known as:  LIORESAL Take 1 tablet (10 mg total) by mouth 3 (three) times daily as needed for muscle spasms.   feeding supplement (ENSURE ENLIVE) Liqd Take 237 mLs by mouth 2 (two) times daily between meals.   feeding supplement (ENSURE ENLIVE) Liqd Take 237 mLs by mouth daily.   FLUoxetine 20 MG capsule Commonly known as:  PROZAC Take 20 mg by mouth daily.   Fluticasone-Salmeterol 250-50 MCG/DOSE Aepb Commonly known as:  ADVAIR Inhale 1 puff into the lungs 2 (two) times daily.   folic acid 1 MG tablet Commonly known as:  FOLVITE Take 1 tablet (1 mg total) by mouth daily.   furosemide 40 MG tablet Commonly known as:  LASIX Take 1 tablet (40 mg total) by mouth daily.   gabapentin 100 MG capsule Commonly known as:  NEURONTIN Take 2 capsules (200 mg total) by mouth 3 (three) times daily.  lactulose 10 GM/15ML solution Commonly known as:  CHRONULAC Take 30 mLs (20 g total) by mouth 2 (two) times daily.   Melatonin 3 MG Tabs Take 1 tablet (3 mg total) by mouth at bedtime as needed (sleep).   miconazole 100 MG vaginal suppository Commonly known as:  MICOTIN Place 1 suppository (100 mg total) vaginally at bedtime.   multivitamin with minerals Tabs tablet Take 1 tablet by mouth daily.   nicotine 21 mg/24hr patch Commonly known as:  NICODERM CQ - dosed in mg/24 hours Place 1 patch (21 mg total) onto the skin daily.   ondansetron 4 MG disintegrating tablet Commonly known as:  ZOFRAN ODT 4mg  ODT q4 hours prn nausea/vomit   pantoprazole 40 MG tablet Commonly known as:  PROTONIX Take 40 mg by mouth daily.   polyethylene glycol packet Commonly known as:  MIRALAX / GLYCOLAX Take 17 g by mouth daily as needed for mild  constipation.   Potassium Chloride ER 20 MEQ Tbcr Take 40 mEq by mouth daily.   rifaximin 550 MG Tabs tablet Commonly known as:  XIFAXAN Take 1 tablet (550 mg total) by mouth 2 (two) times daily.   risperiDONE 0.5 MG tablet Commonly known as:  RISPERDAL Take 1 tablet (0.5 mg total) by mouth at bedtime.   spironolactone 100 MG tablet Commonly known as:  ALDACTONE Take 1 tablet (100 mg total) by mouth daily.   topiramate 100 MG tablet Commonly known as:  TOPAMAX Take 1 tablet (100 mg total) by mouth daily.       Discharge Instructions: Patient signed out AMA.  Follow-Up Appointments:   Howard Pouch, MD 12/21/2015, 8:18 PM PGY-1, [facility]

## 2015-12-19 NOTE — Care Management Obs Status (Signed)
MEDICARE OBSERVATION STATUS NOTIFICATION   Patient Details  Name: Anne Black MRN: [id number] Date of Birth: [birth year]   Medicare Observation Status Notification Given:  Yes    Elliot Cousin, RN 12/19/2015, 11:32 AM

## 2015-12-19 NOTE — Progress Notes (Signed)
Our team was called by RN because Pt is requesting to leave AMA this morning. I assessed the patient and spent 30 minutes talking with her to make sure that she has decision-making capacity before we would let her leave AMA. Patient wants to leave AMA today because it is her twin daughters' 16th birthday. She was able to tell me the reasons that we think she should stay in the hospital- including monitoring her for DTs since she is withdrawing from alcohol, monitoring her liver function since her bilirubin became elevated, and planning for possible paracentesis this admission. She was also able to explain to me the risks of going home, including have a seizure that could kill her and having worsening liver function. She stated that she understands the risks and she would still like to go home for her twin daughters' birthday. She is not planning on drinking when she goes home. She is planning on calling the [facility] tomorrow morning and having them pick her up and bring her back for further substance abuse treatment. After my assessment, I feel that patient demonstrated decision-making capacity. She then left AMA.

## 2015-12-19 NOTE — Care Management Note (Signed)
Case Management Note  Patient Details  Name: Ladon ApplebaumCarla S Spallone MRN: [id number] Date of Birth: [birth year]  Subjective/Objective:     MS flare, ETOH abuse, Cirrhosis, anxiety/bipolar               Action/Plan: Discharge Planning:  Attempted to speak to pt. Pt states she is leaving. Did not want to have a conversation.   Expected Discharge Date:  12/19/2015              Expected Discharge Plan:  Against Medical Advice  In-House Referral:  NA  Discharge planning Services  CM Consult  Post Acute Care Choice:  NA Choice offered to:  NA  DME Arranged:  N/A DME Agency:  NA  HH Arranged:  NA HH Agency:  NA  Status of Service:  Completed, signed off  If discussed at Long Length of Stay Meetings, dates discussed:    Additional Comments:  Elliot CousinShavis, Gifford Ballon Ellen, RN 12/19/2015, 11:33 AM

## 2015-12-19 NOTE — Progress Notes (Signed)
Family Medicine Teaching Service Daily Progress Note Intern Pager: [phone]  Patient name: Anne Black Medical record number: [id number] Date of birth: [birth year] Age: 55 y.o. Gender: female  Primary Care Provider: Palma HolterKanishka G Gunadasa, MD Consultants: None Code Status: Full  Pt Overview and Major Events to Date:  12/19/2015 - Admit to FPTS   Assessment and Plan: Anne ApplebaumCarla S Black is a 55 y.o. female presenting with abdominal distention as well as pain in her legs, back, and face which she believes are secondary to an MS flare. PMH is significant for alcohol cirrhosis with ascites, alcohol abuse, bipolar disorder, GERD, anxiety, MS.  #Alcoholic cirrhosis with ascites: Diagnosed 05/2015 and had negative hepatitis panel and autoimmune labs at that time. PT 16.7 and INR 1.34. WBC 7.2 >> now low at 3.4, all cell lines decreased likely dilutional. Low suspicion for SBP, but cannot rule out. Ascites is likely transudative fluid caused by cirrosis as patient is afebrile and no leukocytosis. No history of SBP on prior paracentesis. Patient with mild abdominal tenderness likely secondary to ascites.  Most recent paracentesis performed on 11/3 with 3.4L removed.  - diagnostic and therapeutic paracentesis in AM if symptoms or clinical picture worsens. - abdominal ultrasound shows moderate ascites, cirrhosis - Continue telemetry - continue spironolactone and lasix, monitor BPs and potassium - restarted lactulose and rifaximin, monitor potassium (see below) - Ibuprofen for pain prn  - PT consult   #Alcohol abuse: Drinks ~1 pint vodka/day. Ethanol 193 on admission. Last drink AM of admission. Has withdrawn from EtOH in the past with anxiety/nausea, also reported seizures per chart review. - CIWA protocol; scores were 9, 9 overnight - may need to consider scheduling a benzo to control symptoms if needed - thiamine, folate, MVI   #Multiple Sclerosis Flare: [facility] neurology. Endorsing some  back and leg pain.  - continue home symmetrel  - solucortef 1000 mg for MS x 3d for acute flare (11/9 - 11/11) - neuro consult in AM  #Hypokalemia: K+ improved at 2.9 from 2.4 at admission,40 KDUR x 2 given overnight with one more dose ordered. Likely secondary to EtOH use.  Mg low at 1.6, will replete and recheck tomorrow. - monitor and replete as needed - consider scheduling potassium  #Bipolar disorder and anxiety: Pt has many admissions for IVC and psych management. On prozac 20 mg, gabapentin 200mg  TID, topamax 100mg  Qd, risperidone 0.5mg  QHS. - continue home meds  #prolonged Qtc  - Qtc prolonged at 528 this morning - avoid Qtc prolonging medications   FEN/GI: Regular diet, SLIV, Protonix Prophylaxis: Lovenox  Disposition: Home, SNF or   Subjective:  Patient did well overnight. Expresses that she would like to leave today for her daughter's birthday party.  States her pain has improved. Is not sure she wants to do a paracentesis today. Overall no N/V/D/C overnight.  Objective: Temp:  [98 F (36.7 C)-99.4 F (37.4 C)] 98 F (36.7 C) (11/10 0644) Pulse Rate:  [74-104] 74 (11/10 0644) Resp:  [16-23] 16 (11/10 0644) BP: (93-123)/(48-78) 93/48 (11/10 0644) SpO2:  [93 %-100 %] 93 % (11/10 14780644) Physical Exam: General: anxious-appearing, chronically-ill appearing female rests in bed, appears older than stated age Cardiovascular: RRR, no m/r/g Respiratory: CTA bil, no W/R/R Abdomen: +distension, +mild tenderness to palpation, +tightness, normoactive BS appreciate Extremities: full ROM in all extremities  Laboratory:  Ethanol 193 PT 16.7, INR 1.34.  WBC 7.2 >> 3.4    Recent Labs Lab 12/18/15 1125 12/19/15 0547  WBC 7.2  3.4*  HGB 11.6* 10.9*  HCT 35.4* 33.3*  PLT 116* 84*    Recent Labs Lab 12/18/15 1125 12/18/15 2108 12/19/15 0547  NA 137  --  135  K 2.4* 2.4* 2.9*  CL 88*  --  96*  CO2 35*  --  30  BUN <5*  --  5*  CREATININE 0.50  --  0.45   CALCIUM 8.2*  --  8.3*  PROT 6.5  --  5.7*  BILITOT 2.4*  --  4.6*  ALKPHOS 189*  --  175*  ALT 38  --  33  AST 95*  --  77*  GLUCOSE 153*  --  154*    Imaging/Diagnostic Tests: Abdominal Ultrasound 12/18/2015 FINDINGS: Gallbladder: No gallstones or wall thickening visualized. No sonographic Murphy sign noted by sonographer.  Common bile duct: Diameter: 2.4 mm, normal Liver: Diffusely increased and coarsened hepatic parenchymal echotexture with nodular contour to the liver. Changes are consistent with fatty infiltration and cirrhosis. No focal liver lesions are identified. IVC: Not visualized due to overlying bowel gas. Pancreas: Not visualized due to overlying bowel gas. Spleen: Size and appearance within normal limits. Right Kidney: Length: 10.9 cm. Echogenicity within normal limits. No mass or hydronephrosis visualized. Left Kidney: Length: 11.5 cm. Echogenicity within normal limits. No mass or hydronephrosis visualized. Abdominal aorta: Not visualized due to overlying bowel gas. Other findings: Moderate diffuse abdominal ascites is present.  IMPRESSION: Hepatic echotexture changes consistent with fatty infiltration and hepatic cirrhosis. No focal liver lesions identified. Moderate diffuse abdominal ascites. No evidence of cholelithiasis or cholecystitis.  Howard Pouch, MD 12/19/2015, 9:57 AM PGY-1, [facility] Family Medicine FPTS Intern pager: [phone], text pages welcome

## 2015-12-19 NOTE — Progress Notes (Signed)
BP 93/48, MD notified at [phone]. Instructions given to monitor pt.

## 2015-12-19 NOTE — Progress Notes (Signed)
Date: 12/19/2015 Patient: Anne Black Admitted: 12/18/2015 10:28 AM Attending Provider: Latrelle Dodrill, MD  [name] or her authorized caregiver has made the decision for the patient to leave the Hospital against the advice of Latrelle Dodrill, MD.  She or her authorized caregiver has been informed and understands the inherent risks, including death.  She or her authorized caregiver has decided to accept the responsibility for this decision. [name] and all necessary parties have been advised that she may return for further evaluation or treatment. Her condition at time of discharge was Stable.  [name] had current vital signs as follows:  Blood pressure (!) 93/48, pulse 74, temperature 98 F (36.7 C), temperature source Oral, resp. rate 16, SpO2 93 %.   [name] or her authorized caregiver has signed the Leaving Against Medical Advice form prior to leaving the hospital  Hank Walling  waldon 12/19/2015

## 2015-12-20 ENCOUNTER — Telehealth: Payer: Self-pay | Admitting: Internal Medicine

## 2015-12-20 NOTE — Telephone Encounter (Signed)
**  After Hours/ Emergency Line Call*  Received a call from Anne Black stating that she would like to talk about her alcoholism. She states that she has been drinking since leaving the hospital AMA. She called the [facility] and they stated that they cannot take her for a couple more days. She would like for me to call in a prescription for Ativan or she would like to come pick up an Ativan prescription from the hospital. I told her this would not be possible. I recommended that she come to the ED for further evaluation. Pt stated she would not do this. I explained to her that this would be the safest course of action. She stated that she understood, but she did not want to come to the ED. She said that she would come to an appointment at our clinic to discuss her alcoholism. Her PCP was not available until Thursday, and she stated she did not want to wait this long. I have scheduled her an appointment for 11/14 at 9:45AM with Dr. Lum Babe.  Will forward to PCP.  Hilton Sinclair, MD PGY-2, [facility] Family Medicine Residency

## 2015-12-21 NOTE — Telephone Encounter (Signed)
Patient called after hours line again this morning. Wondering why we cannot give her anything for withdrawal over the phone. States that she needs help with her alcohol abuse. Has been drinking since leaving the hospital. She is on the fence on whether or not she wants to come in. Advised patient that is she wants to withdraw she needs to come to the hospital as she would not be safe trying to withdraw at home alone.   Caryl Ada, DO 12/21/2015, 9:11 AM PGY-3,  Family Medicine

## 2015-12-22 ENCOUNTER — Telehealth: Payer: Self-pay | Admitting: Internal Medicine

## 2015-12-22 NOTE — Telephone Encounter (Signed)
Pt called and would like to be put in a assisted living program that could help with her addictions and other medical issues. She would also like to have something for her anxiety, and steroids for her pain, plus Zofran so she doesn't keep throwing up. She is also wanting something that will help with her alcohol withdraws, Please call and discuss this today. jw

## 2015-12-23 ENCOUNTER — Ambulatory Visit: Payer: Self-pay | Admitting: Family Medicine

## 2015-12-23 NOTE — Telephone Encounter (Signed)
Returned call to patient.  Reports she can't hardly get out of bed because she feels bad. Reports her entire body hurts. Reports that she is withdrawing from alcohol. She is having severe nausea and "shaking", weakness. Reports that she called and cancelled her appointment today because she does not drive. Reports her mom sometimes can drive her but did not ask her mother to drive her to this appointment. Reports that "feels sad about the whole situation."   I discussed that if she is actively withdrawing, I recommend that she be evaluated in the ED. She agrees with this recommendation but is hesitant to go tonight and says that she would like to go to the ED tomorrow morning. After much discussion she reports "she cannot promise you that I will go to the ED tonight but will promise you that I will go in the morning". She understands that I recommend that she go tonight. She again asked if I can call in a medication to help with her withdrawal at home. I report this is not safe and it is not safe to withdraw at home. She understood. I asked how long she would be in the hospital for withdrawal. I reported that I cannot give a specific number of days and it depends on the patient. Then she asked what the plan is after she withdraws in the hospital and that she needs help getting into a facility. I said that this can be discussed during her hospital stay.

## 2015-12-23 NOTE — Telephone Encounter (Signed)
Pt called again. She wants to know when the dr will call her. She says she is in so much pain that she can hardly walk.she doesn't know that she cannot go to rehab because she cannot walk.  She would like something more for pain.  Please advise

## 2015-12-24 ENCOUNTER — Telehealth: Payer: Self-pay | Admitting: Licensed Clinical Social Worker

## 2015-12-24 NOTE — Progress Notes (Signed)
Social work Librarian, academic from Dr. Ottie Glazier to assess patient for possible resources.  Previously patient has left hospitals against medical advice and not followed recommendations from PCP go to the ED.     LCSW received phone call today from Phill Mutter, [facility] care manager [phone]) for coordination of care.  Darel Hong has provided care management services for patient over the past year.  Per Darel Hong, as of 3:22 today patient has not gone to the ED however she is in agreement to go to the hospital in [place]. LCSW provided PCP with an update.    Plan: Per Darel Hong, patient will to the Hospital in [place] at [facility] today for detox and evaluation.   Sammuel Hines, LCSW Licensed Clinical Social Worker [facility]   [phone] 3:34 PM

## 2015-12-25 ENCOUNTER — Telehealth: Payer: Self-pay | Admitting: Neurology

## 2015-12-25 NOTE — Telephone Encounter (Signed)
The patient has called back. She requested to speak with the Print production planner. I asked what it was regarding and she Dr Anne Hahn and she did not get along. She said Dr Anne Hahn is a mean, mean, mean man. I advised her that the patients do not talk about Dr Anne Hahn in that manner. They are pleased with his care. She said "maybe I am wrong, maybe I am wrong".She talked about Dr Noreene Filbert and she got along very well. The patient is extremely teary-eyed. I advised her to call her PCP who would direct in finding the care she needed. She said "Yes and it will be out of [place]". She again begged to be seen again. I advised her my hands are tied, a msg would be sent to DR Anne Hahn. She began abruptly wailing and just as abruptly stopped. She said ok, I disconnected the call.

## 2015-12-25 NOTE — Telephone Encounter (Signed)
I called the patient and advised her Dr Anne Hahn advised she would not be seen again and to call her PCP. She said ok.

## 2015-12-25 NOTE — Telephone Encounter (Signed)
Pt called wanting to be seen again. I advised she has been dismissed from the practice. She said "I don't even know the reason for that". I read the entire letter dated 06/20/13. She said "he was requesting MRI when I could not get it all done in the time frame he wanted it done". "My mother worked for [employer] for 35 years and sent you many patients. I am begging to be seen again. I need a neurologist". I advised her again of the letter and that I did not think she would be seen again. She said "please grant me some grace, I'm begging you". I advised her I would send a msg but Dr Anne HahnWillis would have to make that decision. She said thank you. I spent 10 min on the phone with this patient.

## 2015-12-26 ENCOUNTER — Emergency Department (HOSPITAL_COMMUNITY): Payer: Managed Care, Other (non HMO)

## 2015-12-26 ENCOUNTER — Emergency Department (HOSPITAL_COMMUNITY)
Admission: EM | Admit: 2015-12-26 | Discharge: 2015-12-26 | Disposition: A | Discharge status: Home / Self Care | Payer: Managed Care, Other (non HMO) | Attending: Emergency Medicine | Admitting: Emergency Medicine

## 2015-12-26 ENCOUNTER — Encounter (HOSPITAL_COMMUNITY): Payer: Self-pay | Admitting: Emergency Medicine

## 2015-12-26 DIAGNOSIS — F909 Attention-deficit hyperactivity disorder, unspecified type: Secondary | ICD-10-CM | POA: Insufficient documentation

## 2015-12-26 DIAGNOSIS — W19XXXA Unspecified fall, initial encounter: Secondary | ICD-10-CM | POA: Insufficient documentation

## 2015-12-26 DIAGNOSIS — Y999 Unspecified external cause status: Secondary | ICD-10-CM | POA: Insufficient documentation

## 2015-12-26 DIAGNOSIS — K7031 Alcoholic cirrhosis of liver with ascites: Secondary | ICD-10-CM | POA: Insufficient documentation

## 2015-12-26 DIAGNOSIS — Z79899 Other long term (current) drug therapy: Secondary | ICD-10-CM | POA: Diagnosis not present

## 2015-12-26 DIAGNOSIS — Y939 Activity, unspecified: Secondary | ICD-10-CM | POA: Diagnosis not present

## 2015-12-26 DIAGNOSIS — Y929 Unspecified place or not applicable: Secondary | ICD-10-CM | POA: Insufficient documentation

## 2015-12-26 DIAGNOSIS — R51 Headache: Secondary | ICD-10-CM | POA: Diagnosis present

## 2015-12-26 DIAGNOSIS — F17210 Nicotine dependence, cigarettes, uncomplicated: Secondary | ICD-10-CM | POA: Diagnosis not present

## 2015-12-26 MED ORDER — GABAPENTIN 400 MG PO CAPS: 400.0000 mg | ORAL_CAPSULE | Freq: Three times a day (TID) | ORAL | 0 refills | Status: DC

## 2015-12-26 MED ORDER — CHLORDIAZEPOXIDE HCL 25 MG PO CAPS
25.0000 mg | ORAL_CAPSULE | Freq: Once | ORAL | Status: AC
  Administered 2015-12-26: 25 mg via ORAL
  Filled 2015-12-26: qty 1

## 2015-12-26 MED ORDER — CHLORDIAZEPOXIDE HCL 25 MG PO CAPS: ORAL_CAPSULE | ORAL | 0 refills | Status: DC

## 2015-12-26 MED ORDER — ACETAMINOPHEN 325 MG PO TABS
650.0000 mg | ORAL_TABLET | Freq: Once | ORAL | Status: AC
  Administered 2015-12-26: 650 mg via ORAL
  Filled 2015-12-26: qty 2

## 2015-12-26 NOTE — ED Notes (Signed)
Delay on vitals, pt is not in the room

## 2015-12-26 NOTE — ED Notes (Signed)
Social work at bedside giving pt resources.

## 2015-12-26 NOTE — ED Notes (Addendum)
Pt has requested medication for detox and has also requested that all of her medications be refilled.

## 2015-12-26 NOTE — ED Notes (Signed)
ED Provider at bedside. 

## 2015-12-26 NOTE — Progress Notes (Signed)
Staffed case with EDP.  CSW spoke with patient at bedside explained and provided resources for residential and outpatient detox/rehab facilities. Nurse made aware.   Posey Rea, LCSWA Clinical Social Worker [phone] 12:59 PM

## 2015-12-26 NOTE — ED Provider Notes (Signed)
[facility]-EMERGENCY DEPT Provider Note   CSN: [id number] Arrival date & time: 12/26/15  0913     History   Chief Complaint Chief Complaint  Patient presents with  . Fall  . Headache    HPI Anne Black is a 55 y.o. female.  HPI  Patient presents after a fall. Patient does not have recall a fall, though she initially states that she remembers part of it. She does recall being in the bathroom, inability on the floor after falling. She has pain throughout her head, diffusely, sore, as well as her mid cervical spine. She denies weakness in any extremity, confusion, disorientation, but does acknowledge forgetfulness. He acknowledges multiple medical issues including cirrhosis, states that she has not had paracentesis in a long time, those she was supposed to do so several weeks ago. She denies recent fever, vomiting. She does acknowledge substantial ongoing alcohol intake.  Past Medical History:  Diagnosis Date  . ADHD (attention deficit hyperactivity disorder)   . Anxiety   . Arthritis   . Ascites   . Bipolar disorder (HCC)   . Cirrhosis (HCC)   . Hypokalemia   . Migraine   . Multiple sclerosis (HCC)   . Osteoporosis     Patient Active Problem List   Diagnosis Date Noted  . Alcoholism (HCC)   . Non-intractable vomiting with nausea   . Alcohol withdrawal (HCC) 12/15/2015  . Multiple sclerosis exacerbation (HCC) 12/12/2015  . Acute alcoholism (HCC)   . Abdominal distension 11/04/2015  . Alcohol abuse   . Shortness of breath   . Acute respiratory failure (HCC) 10/15/2015  . Ascites 10/15/2015  . Osteoporosis   . Cirrhosis (HCC)   . Bipolar I disorder (HCC)   . Multiple sclerosis (HCC)   . Alcoholic cirrhosis of liver with ascites (HCC) 08/22/2015  . Bipolar disorder, current episode mixed, moderate (HCC) 08/01/2015  . Alcohol use disorder, severe, dependence (HCC) 07/31/2015  . Alcoholic cirrhosis (HCC) 05/31/2015  . Alcoholic hepatitis with ascites 05/31/2015    . Macrocytic anemia- due to alcohol abuse with normal B12 & folate levels 05/31/2015  . Severe protein-calorie malnutrition (HCC) 05/31/2015  . Hypokalemia 05/26/2015  . Encephalopathy, hepatic (HCC) 05/26/2015  . Anxiety 05/18/2015  . Sepsis (HCC) 05/18/2015  . UTI (lower urinary tract infection) 05/18/2015  . Stimulant abuse 12/27/2014  . Nicotine abuse 12/27/2014  . Hyperprolactinemia (HCC) 11/15/2014  . Dyslipidemia   . Noncompliance with therapeutic plan 04/04/2013  . Unspecified hereditary and idiopathic peripheral neuropathy 05/01/2012  . GERD (gastroesophageal reflux disease) 05/01/2012  . Osteoarthrosis, unspecified whether generalized or localized, involving lower leg 05/01/2012  . MS (multiple sclerosis) (HCC) 09/14/2011    Past Surgical History:  Procedure Laterality Date  . CESAREAN SECTION  [id number]  . MYRINGOTOMY WITH TUBE PLACEMENT Bilateral   . TONSILLECTOMY      OB History    No data available       Home Medications    Prior to Admission medications   Medication Sig Start Date End Date Taking? Authorizing Provider  albuterol (PROVENTIL HFA;VENTOLIN HFA) 108 (90 Base) MCG/ACT inhaler Inhale 2 puffs into the lungs every 6 (six) hours as needed for wheezing or shortness of breath.    Historical Provider, MD  amantadine (SYMMETREL) 100 MG capsule Take 1 capsule (100 mg total) by mouth 2 (two) times daily. 08/04/15   Oneta Rackanika N Lewis, NP  baclofen (LIORESAL) 10 MG tablet Take 1 tablet (10 mg total) by mouth 3 (three) times daily as needed  for muscle spasms. 10/24/15   Garth Bigness, MD  feeding supplement, ENSURE ENLIVE, (ENSURE ENLIVE) LIQD Take 237 mLs by mouth 2 (two) times daily between meals. Patient not taking: Reported on 12/18/2015 10/16/15   Garth Bigness, MD  feeding supplement, ENSURE ENLIVE, (ENSURE ENLIVE) LIQD Take 237 mLs by mouth daily. 10/25/15   Garth Bigness, MD  FLUoxetine (PROZAC) 20 MG capsule Take 20 mg by mouth daily.      Historical Provider, MD  Fluticasone-Salmeterol (ADVAIR) 250-50 MCG/DOSE AEPB Inhale 1 puff into the lungs 2 (two) times daily.    Historical Provider, MD  folic acid (FOLVITE) 1 MG tablet Take 1 tablet (1 mg total) by mouth daily. 08/30/15   Barnetta Chapel, MD  furosemide (LASIX) 40 MG tablet Take 1 tablet (40 mg total) by mouth daily. 08/30/15   Barnetta Chapel, MD  gabapentin (NEURONTIN) 100 MG capsule Take 2 capsules (200 mg total) by mouth 3 (three) times daily. 08/30/15   Barnetta Chapel, MD  lactulose (CHRONULAC) 10 GM/15ML solution Take 30 mLs (20 g total) by mouth 2 (two) times daily. 08/30/15   Barnetta Chapel, MD  Melatonin 3 MG TABS Take 1 tablet (3 mg total) by mouth at bedtime as needed (sleep). 10/24/15   Garth Bigness, MD  miconazole (MICOTIN) 100 MG vaginal suppository Place 1 suppository (100 mg total) vaginally at bedtime. 10/25/15   Garth Bigness, MD  Multiple Vitamin (MULTIVITAMIN WITH MINERALS) TABS tablet Take 1 tablet by mouth daily. 08/30/15   Barnetta Chapel, MD  nicotine (NICODERM CQ - DOSED IN MG/24 HOURS) 21 mg/24hr patch Place 1 patch (21 mg total) onto the skin daily. Patient not taking: Reported on 12/18/2015 08/04/15   Oneta Rack, NP  ondansetron (ZOFRAN ODT) 4 MG disintegrating tablet 4mg  ODT q4 hours prn nausea/vomit 12/12/15   Blane Ohara, MD  pantoprazole (PROTONIX) 40 MG tablet Take 40 mg by mouth daily.    Historical Provider, MD  polyethylene glycol (MIRALAX / GLYCOLAX) packet Take 17 g by mouth daily as needed for mild constipation. 10/16/15   Garth Bigness, MD  potassium chloride 20 MEQ TBCR Take 40 mEq by mouth daily. 10/16/15   Garth Bigness, MD  rifaximin (XIFAXAN) 550 MG TABS tablet Take 1 tablet (550 mg total) by mouth 2 (two) times daily. 06/05/15   Catarina Hartshorn, MD  risperiDONE (RISPERDAL) 0.5 MG tablet Take 1 tablet (0.5 mg total) by mouth at bedtime. 08/04/15   Oneta Rack, NP  spironolactone (ALDACTONE) 100 MG tablet Take  1 tablet (100 mg total) by mouth daily. Patient not taking: Reported on 12/18/2015 08/30/15   Barnetta Chapel, MD  topiramate (TOPAMAX) 100 MG tablet Take 1 tablet (100 mg total) by mouth daily. 02/21/15   Thermon Leyland, NP    Family History Family History  Problem Relation Age of Onset  . Heart disease Mother   . Heart disease Father   . Hypertension Father     Social History Social History  Substance Use Topics  . Smoking status: Current Every Day Smoker    Packs/day: 0.75    Years: 20.00    Types: Cigarettes  . Smokeless tobacco: Never Used     Comment: is cutting back, not in the car, house  . Alcohol use 4.8 oz/week    2 Glasses of wine, 2 Cans of beer, 4 Standard drinks or equivalent per week     Comment: 10/2015   " I drink occasionaly "  Allergies   Patient has no known allergies.   Review of Systems Review of Systems  Constitutional:       Per HPI, otherwise negative  HENT:       Per HPI, otherwise negative  Respiratory:       Per HPI, otherwise negative  Cardiovascular:       Per HPI, otherwise negative  Gastrointestinal: Negative for vomiting.  Endocrine:       Negative aside from HPI  Genitourinary:       Neg aside from HPI   Musculoskeletal:       Per HPI, otherwise negative  Skin: Positive for pallor.  Allergic/Immunologic: Positive for immunocompromised state.  Neurological: Positive for weakness and headaches. Negative for syncope.  Psychiatric/Behavioral:       History of bipolar disorder     Physical Exam Updated Vital Signs BP 123/68   Pulse 82   Temp 98.1 F (36.7 C) (Oral)   Resp 16   SpO2 96%   Physical Exam  Constitutional: She is oriented to person, place, and time. No distress.  Disheveled appearing, anxious obese female resting in bed  HENT:  Head: Normocephalic.    Nose: Nose normal.  MM dry, oropharynx clear   Eyes: Conjunctivae and EOM are normal. Pupils are equal, round, and reactive to light.  Neck: Normal  range of motion. Neck supple.    Cardiovascular: Regular rhythm, normal heart sounds and intact distal pulses.  Tachycardia present.   Pulmonary/Chest: Effort normal and breath sounds normal.  Abdominal: She exhibits distension. There is tenderness (generalized). There is no rigidity, no rebound, no guarding and no CVA tenderness. A hernia (umbillical) is present.  Musculoskeletal: She exhibits no edema or tenderness.  Neurological: She is alert and oriented to person, place, and time. She has normal strength. She displays tremor. No cranial nerve deficit. GCS eye subscore is 4. GCS verbal subscore is 5. GCS motor subscore is 6.  Anxiousness about her forgetfulness, but otherwise awake, alert, oriented 3  Skin: Skin is warm and dry. No rash noted. She is not diaphoretic.  Nursing note and vitals reviewed.    ED Treatments / Results    Radiology Ct Head Wo Contrast  Result Date: 12/26/2015 CLINICAL DATA:  Headache following a fall this morning. EXAM: CT HEAD WITHOUT CONTRAST CT CERVICAL SPINE WITHOUT CONTRAST TECHNIQUE: Multidetector CT imaging of the head and cervical spine was performed following the standard protocol without intravenous contrast. Multiplanar CT image reconstructions of the cervical spine were also generated. COMPARISON:  Brain and cervical spine MR examinations dated 08/02/2015. FINDINGS: CT HEAD FINDINGS Brain: Diffusely enlarged ventricles and subarachnoid spaces. Patchy white matter low density in both cerebral hemispheres. No intracranial hemorrhage, mass lesion or CT evidence of acute infarction. Vascular: No hyperdense vessel or unexpected calcification. Skull: Normal. Negative for fracture or focal lesion. Sinuses/Orbits: No acute finding. Other: None. CT CERVICAL SPINE FINDINGS Alignment: Normal. Skull base and vertebrae: No acute fracture. No primary bone lesion or focal pathologic process. Soft tissues and spinal canal: No prevertebral fluid or swelling. No visible  canal hematoma. Disc levels:  Minimal degenerative changes at multiple levels. Upper chest: Clear lung apices. Other: None. IMPRESSION: 1. No skull fracture or intracranial hemorrhage. 2. No cervical spine fracture or subluxation. 3. Stable atrophy and chronic small vessel white matter ischemic changes in both cerebral hemispheres. 4. Minimal cervical spine degenerative changes. Electronically Signed   By: Beckie Salts M.D.   On: 12/26/2015 10:29   Ct Cervical  Spine Wo Contrast  Result Date: 12/26/2015 CLINICAL DATA:  Headache following a fall this morning. EXAM: CT HEAD WITHOUT CONTRAST CT CERVICAL SPINE WITHOUT CONTRAST TECHNIQUE: Multidetector CT imaging of the head and cervical spine was performed following the standard protocol without intravenous contrast. Multiplanar CT image reconstructions of the cervical spine were also generated. COMPARISON:  Brain and cervical spine MR examinations dated 08/02/2015. FINDINGS: CT HEAD FINDINGS Brain: Diffusely enlarged ventricles and subarachnoid spaces. Patchy white matter low density in both cerebral hemispheres. No intracranial hemorrhage, mass lesion or CT evidence of acute infarction. Vascular: No hyperdense vessel or unexpected calcification. Skull: Normal. Negative for fracture or focal lesion. Sinuses/Orbits: No acute finding. Other: None. CT CERVICAL SPINE FINDINGS Alignment: Normal. Skull base and vertebrae: No acute fracture. No primary bone lesion or focal pathologic process. Soft tissues and spinal canal: No prevertebral fluid or swelling. No visible canal hematoma. Disc levels:  Minimal degenerative changes at multiple levels. Upper chest: Clear lung apices. Other: None. IMPRESSION: 1. No skull fracture or intracranial hemorrhage. 2. No cervical spine fracture or subluxation. 3. Stable atrophy and chronic small vessel white matter ischemic changes in both cerebral hemispheres. 4. Minimal cervical spine degenerative changes. Electronically Signed   By:  Beckie Salts M.D.   On: 12/26/2015 10:29    Procedures Procedures (including critical care time)  Medications Ordered in ED Medications  acetaminophen (TYLENOL) tablet 650 mg (650 mg Oral Given 12/26/15 1149)    Chart review notable for 12 visits over the past 6 months, including recent evaluation in the emergency department.  Initial Impression / Assessment and Plan / ED Course  I have reviewed the triage vital signs and the nursing notes.  Pertinent labs & imaging results that were available during my care of the patient were reviewed by me and considered in my medical decision making (see chart for details).  Clinical Course     On repeat exam the patient is in no distress she is awake and alert. She requests information on both detox and rehabilitation facility placement. Chart review distress the patient has had similar conversations with her care team in the past, has had relapses, but again she requests additional information today. I discussed the importance of following up with outpatient providers, specifically for assistance with alcohol cessation. Patient requests refill of her gabapentin, as well as medication for alcohol detoxification.  I discussed this case with our social work team to facilitate provision resources.  Absent evidence for cranial hemorrhage, fracture, with no evidence for distress, and with patient's well described pathology including alcoholism, cirrhosis, now, after successful paracentesis, and unremarkable imaging studies were performed, the patient is appropriate for further evaluation, management, alcohol dependency counseling to occur as an outpatient.   Gerhard Munch, MD 12/26/15 [phone]

## 2015-12-26 NOTE — ED Triage Notes (Signed)
Per EMS. Pt had a fall this am. Unknown time of fall. Pt complains of a HA. Also asking for tylenol and food

## 2015-12-26 NOTE — ED Notes (Signed)
Bed: WA23 Expected date:  Expected time:  Means of arrival:  Comments: EMS 

## 2015-12-26 NOTE — ED Notes (Signed)
Pt complains of shaking and nausea. However pt has not had tremors during ED stay and has been asking for and has been given multiple ginger ales

## 2015-12-26 NOTE — Discharge Instructions (Signed)
As discussed, it is very important that you follow-up with both your primary care team, and the counseling center [facility] you have been referred by our social worker team.  Please take all medication as directed, and do not hesitate to return here for concerning changes in your condition.

## 2015-12-26 NOTE — ED Notes (Signed)
Pt transported to US

## 2015-12-26 NOTE — Procedures (Signed)
Ultrasound-guided  therapeutic paracentesis performed yielding 4.4 liters of slightly hazy, yellow fluid. No immediate complications.

## 2015-12-26 NOTE — ED Notes (Signed)
Pt ambulated to the restroom without assistance

## 2015-12-28 ENCOUNTER — Telehealth: Payer: Self-pay | Admitting: Internal Medicine

## 2015-12-28 NOTE — Telephone Encounter (Signed)
Patient called after hours line concerned about a fungal infection in her mouth. She asked if magic mouth wash could be called in to her pharmacy. I told the patient that we would not be able to call in any medications to her pharmacy without seeing her in person first. Patient asked if it was ok to use hydrogen peroxide mouth wash. I recommended that she be seen at the [facility] tomorrow or even go to urgent care today if her symptoms were bothersome. Patient voiced understanding and had no further questions.  Katina Degree. Jimmey Ralph, MD [facility] Family Medicine Resident PGY-3 12/28/2015 10:23 AM

## 2015-12-28 NOTE — Telephone Encounter (Signed)
Received call from patient on after hours line. Stated that she started having a significant amount of leakage coming from her site of paracentesis that had leaked through her shirt and pants. I advised patient to go the ED for further evaluation. Patient voiced understanding and had no further questions.  Katina Degree. Jimmey Ralph, MD [facility] Family Medicine Resident PGY-3 12/28/2015 1:05 PM

## 2015-12-28 NOTE — Telephone Encounter (Signed)
Received call from patient on after hours line. Patient stated that she had a paracentesis 3 days ago and since then has had "terrible" pain in her abdomen radiating to her back. Patient also reports nausea without vomiting. Patient asked if I could send in pain medications. I told her that I would not be able to do that, but she should go to the ED to be evaluated for a complication of her paracentesis. Patient voiced understanding and had no further questions.  Katina Degree. Jimmey Ralph, MD [facility] Family Medicine Resident PGY-3 12/28/2015 8:28 AM

## 2015-12-29 ENCOUNTER — Telehealth: Payer: Self-pay | Admitting: *Deleted

## 2015-12-29 ENCOUNTER — Ambulatory Visit: Payer: Self-pay | Admitting: Family Medicine

## 2015-12-29 NOTE — Telephone Encounter (Signed)
Will forward to MD to make aware.  Patient is coming in tomorrow for a visit with Dr. Kennon RoundsHaney. Jazmin Hartsell,CMA

## 2015-12-29 NOTE — Telephone Encounter (Signed)
Darel Hong, rep from [place] calls again, without patient on the line at first. She ask about patient being directly admitted in to the hospital for leaking after her paracentesis site. I advised that Per Dr. Lowella Fairy note, we are unable to do a direct admit unless she is seen in clinic on the same day. And she is advise that she go to the ED as soon as possible to be evaluated for leaking after her paracentesis. Patient was then brought into call and advised of this message. I advised patient I had a 4 pm appt today and he response was "I live 30 mins away, what do you want me to do, Fly?" Darel Hong then advised patient that if she continued to give smart comments that she would no longer help patient. Patient eventually made an appt for tomorrow at 1:45 PM with Dr. Kennon Rounds.

## 2015-12-29 NOTE — Telephone Encounter (Signed)
Please call patient and report to her that I am unable refill these medications without her being seen in clinic. Also we are unable to do a direct admit unless she is seen in clinic on the same day. I advise that she go to the ED as soon as possible to be evaluated for leaking after her paracentesis.

## 2015-12-29 NOTE — Telephone Encounter (Signed)
Patient called and states that she needs a refill on her meloxicam, magic mouth wash (thrush on tongue), non narcotic pain medication and anxiety medication (currently taking benadryl and gabapentin).  She would also like to be direct admitted today if possible for severe pain from her MS, liver/abdominal pain.  Patient states that she had a paracenthesis done and it is painful and leaking.

## 2015-12-29 NOTE — Telephone Encounter (Signed)
Received a call from patient and a representative from [employer]. Patient has called them about all the demands she had already called [place] about this morning (Magic Mouthwash and Dermabond) Once I advised the rep that patient had already called [place] this morning, patient began to yell stating we never answer back in a timely fashion. I advised that we are allotted 24-48 hrs to respond. I also advised rep that patient spoke to our on-call MD 3x this past weekend and on all 3 calls, he advised her to return to the ED, which she did not. Once the rep realized all of this, she advised me that I could disconnect the call and she would further speak to patient.

## 2015-12-30 ENCOUNTER — Ambulatory Visit (INDEPENDENT_AMBULATORY_CARE_PROVIDER_SITE_OTHER): Payer: Managed Care, Other (non HMO) | Admitting: Student

## 2015-12-30 ENCOUNTER — Other Ambulatory Visit: Payer: Self-pay | Admitting: Internal Medicine

## 2015-12-30 ENCOUNTER — Encounter: Payer: Self-pay | Admitting: Student

## 2015-12-30 VITALS — BP 108/66 | HR 101 | Temp 97.9°F | Ht 62.0 in | Wt 162.0 lb

## 2015-12-30 DIAGNOSIS — E876 Hypokalemia: Secondary | ICD-10-CM | POA: Diagnosis not present

## 2015-12-30 DIAGNOSIS — K7031 Alcoholic cirrhosis of liver with ascites: Secondary | ICD-10-CM | POA: Diagnosis not present

## 2015-12-30 DIAGNOSIS — F419 Anxiety disorder, unspecified: Secondary | ICD-10-CM | POA: Diagnosis not present

## 2015-12-30 DIAGNOSIS — M545 Low back pain, unspecified: Secondary | ICD-10-CM | POA: Insufficient documentation

## 2015-12-30 DIAGNOSIS — F1020 Alcohol dependence, uncomplicated: Secondary | ICD-10-CM | POA: Diagnosis not present

## 2015-12-30 LAB — COMPLETE METABOLIC PANEL WITH GFR
ALT: 25 U/L (ref 6–29)
AST: 60 U/L — AB (ref 10–35)
Albumin: 2.8 g/dL — ABNORMAL LOW (ref 3.6–5.1)
Alkaline Phosphatase: 235 U/L — ABNORMAL HIGH (ref 33–130)
BILIRUBIN TOTAL: 1.4 mg/dL — AB (ref 0.2–1.2)
BUN: 8 mg/dL (ref 7–25)
CHLORIDE: 103 mmol/L (ref 98–110)
CO2: 26 mmol/L (ref 20–31)
CREATININE: 0.37 mg/dL — AB (ref 0.50–1.05)
Calcium: 7.9 mg/dL — ABNORMAL LOW (ref 8.6–10.4)
GFR, Est Non African American: >89 mL/min (ref >=60)
Glucose, Bld: 78 mg/dL (ref 65–99)
Potassium: 3.6 mmol/L (ref 3.5–5.3)
Sodium: 139 mmol/L (ref 135–146)
TOTAL PROTEIN: 5.6 g/dL — AB (ref 6.1–8.1)

## 2015-12-30 MED ORDER — ONDANSETRON HCL 4 MG PO TABS: 4.0000 mg | ORAL_TABLET | Freq: Three times a day (TID) | ORAL | 0 refills | Status: DC | PRN

## 2015-12-30 MED ORDER — PANTOPRAZOLE SODIUM 40 MG PO TBEC: 40.0000 mg | DELAYED_RELEASE_TABLET | Freq: Every day | ORAL | 3 refills | Status: DC

## 2015-12-30 MED ORDER — HYDROXYZINE HCL 10 MG PO TABS: 10.0000 mg | ORAL_TABLET | Freq: Three times a day (TID) | ORAL | 0 refills | Status: DC | PRN

## 2015-12-30 MED ORDER — MELOXICAM 7.5 MG PO TABS: 7.5000 mg | ORAL_TABLET | Freq: Every day | ORAL | 0 refills | Status: DC

## 2015-12-30 NOTE — Patient Instructions (Signed)
Follow up with PCP Please Call [facility] at [phone]. You were given a hand out for these services Your PCP will schedule your next paracentesis If you have any further issues please call the office at [phone]

## 2015-12-30 NOTE — Assessment & Plan Note (Signed)
Patient still actively drinking. SW was consulted during the visit and options inpateint rehab facilities were given. She will call and if she has any issues, we will help as we can

## 2015-12-30 NOTE — Assessment & Plan Note (Signed)
Increasing need for paracentesis, no evidence of SBP in exam but still some fluid although was drained on 11/17, 4 days ago. She does have a GI doctor in [place] and doe snot wish to be be seen in [place]. Rate of accumulation makes me think she may need scheduled therapeutic paracentises in future

## 2015-12-30 NOTE — Assessment & Plan Note (Signed)
Requested something for anxiety. Will given vistaril

## 2015-12-30 NOTE — Assessment & Plan Note (Addendum)
K 2.9 prior to leaving AMA from the hospital 11/10. On spiro and lasix Will repeat today

## 2015-12-30 NOTE — Progress Notes (Signed)
   Subjective:    Patient ID: Anne Black, female    DOB: 1960-06-21, 55 y.o.   MRN: [id number]   CC: Paracentesis site draining, desire for inpatient rehabilitation  HPI: 13 rolled female with a PMH of alcohol cirrhosis requiring multiple paracenteses presents for concern of paracentesis site drainage  Paracentesis site drainage - She feels it has slowed - Last paracentesis on 11/17 at which time 4.4 L was drained - Paracentesis prior to that was on 11/3 and was drained of 3.4 L - She denies fevers, has had continuous abdominal pain  Alcohol use - Drinks liquor daily, last had 2 drinks of vodka this morning - Requests help with inpatient rehabilitation  - She was admitted to [facility] rehabilitation for alcohol use but left AMA because she states she felt unsafe after falling there  - She reports daily nausea but has not had emesis if she does not drink and asks for antiemetics to help with her nausea   Smoking status reviewed  Review of Systems  Per HPI, else denies recent illness, fever, chest pain, shortness of breath  Objective:  BP 108/66   Pulse (!) 101   Temp 97.9 F (36.6 C) (Oral)   Ht 5\' 2"  (1.575 m)   Wt 162 lb (73.5 kg)   BMI 29.63 kg/m  Vitals and nursing note reviewed  General: NAD Cardiac: RRR Respiratory: CTAB, normal effort Abdomen: Distended but not tense ascitic abdmomen, mildly tender but distractible, no rebound or guarding. Paracentesis site at the right lower quadrant nonerythematous, nonswollen.  Skin: warm and dry, no rashes noted Neuro: alert and oriented   Assessment & Plan:    Alcoholic cirrhosis of liver with ascites (HCC) Increasing need for paracentesis, no evidence of SBP in exam but still some fluid although was drained on 11/17, 4 days ago. She does have a GI doctor in [place] and doe snot wish to be be seen in [place]. Rate of accumulation makes me think she may need scheduled therapeutic paracentises in  future  Hypokalemia K 2.9 prior to leaving AMA from the hospital 11/10. On spiro and lasix Will repeat today  Acute alcoholism (HCC) Patient still actively drinking. SW was consulted during the visit and options inpateint rehab facilities were given. She will call and if she has any issues, we will help as we can  Anxiety Requested something for anxiety. Will given vistaril  Non-intractable vomiting with nausea Zofran for nausea  Low back ache Mobic for back pain    Travious Vanover A. Kennon Rounds MD, MS Family Medicine Resident PGY-3 Pager [phone]

## 2015-12-30 NOTE — Progress Notes (Signed)
**  After Hours/ Emergency Line Call*  Received a call to report that Anne Black complaining of oral thrush. She states she was seen in clinic today, but forgot to tell the doctor about her thrush. She is requesting that I call in some Magic Mouthwash to her pharmacy. Magic Mouthwash called in.    Will forward to PCP.  Hilton Sinclair, MD PGY-2, [facility] Family Medicine Residency

## 2015-12-30 NOTE — Assessment & Plan Note (Signed)
Zofran for nausea

## 2015-12-30 NOTE — Assessment & Plan Note (Signed)
Mobic for back pain

## 2015-12-31 ENCOUNTER — Other Ambulatory Visit: Payer: Self-pay | Admitting: Internal Medicine

## 2015-12-31 ENCOUNTER — Telehealth: Payer: Self-pay | Admitting: Internal Medicine

## 2015-12-31 NOTE — Telephone Encounter (Signed)
Wants to know about getting an antibus injection or pill. She doesn't understand why the dr didn't prescribe it for her at her last visit

## 2015-12-31 NOTE — Telephone Encounter (Signed)
Needs refills on topamax, maxalt (sp?), neurotin.  [facility] in [place].  She would also like a note stating her liver is ok. This is needed for a treatment facility in [place].

## 2016-01-02 ENCOUNTER — Telehealth: Payer: Self-pay | Admitting: Student

## 2016-01-02 NOTE — Telephone Encounter (Signed)
After hours telephone call  Patient calls to report some "serious complications" including cardiac arrhythmia, severe pain, and cirrhosis of the liver.  She is having serious pain and thinks that she needs to be in the hospital to be monitored.  She thinks that her state of health is unacceptable, because paracentesis hurt and she has to get them for cirrhosis.  She reports that she cannot take care of herself and is a "huge fall risk."  She thinks gabapentin needs to be increased and she needs to add topamax.    Nothing has changed from baseline per patient.  She is not happy with course that has been taken thus far.  She wants more pain medications and to be put in ALF.  She does not want to go to ED or to come into [facility], because she was just seen 11/21.  Advised calling to make appt next week for re-eval for back pain as no pain medications will be prescribed over the phone.  If she feels as though things are worsening or become an emergency, she can go to ED this weekend.  Advised that topamax refill is not an emergency and to send through pharmacy.  Patient becomes angry and insists that we should not need to re-evaluate and just need to take care of her back pain. "Doctors make mistakes"  Erasmo Downer, MD, MPH PGY-3,  [facility] 01/02/2016 4:43 PM

## 2016-01-03 ENCOUNTER — Telehealth: Payer: Self-pay | Admitting: Student

## 2016-01-03 NOTE — Telephone Encounter (Signed)
Patient called and asked " why didn't you admit me?". She states she is withdrawing since she " feels so bad". Last had vodka this AM. She states she is not intoxicated. She was advised to go to the ED if she feels she is with drawign to be further evaluated. She expressed her understanding

## 2016-01-07 MED ORDER — TOPIRAMATE 100 MG PO TABS: 100.0000 mg | ORAL_TABLET | Freq: Every day | ORAL | 0 refills | Status: DC

## 2016-01-07 MED ORDER — GABAPENTIN 100 MG PO CAPS: 200.0000 mg | ORAL_CAPSULE | Freq: Three times a day (TID) | ORAL | 0 refills | Status: DC

## 2016-01-09 ENCOUNTER — Telehealth: Payer: Self-pay | Admitting: Licensed Clinical Social Worker

## 2016-01-09 NOTE — Telephone Encounter (Signed)
Called patient to ask if she would like to attend TOPc clinic on 12/7. Patient is agreeable to switch her appointment from 12/6 to 12/7 at 2pm. Will make this appointment.

## 2016-01-09 NOTE — Progress Notes (Signed)
Received phone call from patients' mother Tamera Stands asking for assist in locating rehab at a skilled nursing facility for patient.  Per Ms. Hatley patient spent 5 days at an alcohol rehab but was sent home due difficulty with walking and not able to care for self.  States patient need rehab at skilled facility, they are having difficulty finding a facility that will take patient's insurance.    Informed Ms. Hatley LCSW could only provide general information.  LCSW explained the placement process for rehab /SNF, and provided her with several facility names and phone number in network with patient's insurance.   Plan:  1. Patient's mother will call the facilities,  2. Contact patient's Buyer, retail for approval and  3. Call LCSW or patient's PCP for an FL2 if she finds a facility that will take patient  Sammuel Hines, LCSW Licensed Clinical Social Worker [facility]   [phone] 12:26 PM

## 2016-01-10 ENCOUNTER — Telehealth: Payer: Self-pay | Admitting: Family Medicine

## 2016-01-10 NOTE — Telephone Encounter (Signed)
Emergency Line Phone Call Returned patient call. Reports history of Asthma and COPD and would like prescription for steroids called in. Patient reports she has an appointment scheduled December 7. Discussed that medications are not prescribed over the phone without evaluation and that she can go to the Emergency Department or Urgent Care if she she is having significant distress or I could offer her an appointment at the [facility] prior to December 7. Patient hung up phone before conversation could be continued. Attempted to return call again without success. Note patient was speaking in complete sentences and did not sound short of breath over limited telephone encounter.   Dr. Caroleen Hamman 01/10/16, 3:47 PM

## 2016-01-14 ENCOUNTER — Ambulatory Visit: Payer: Self-pay | Admitting: Internal Medicine

## 2016-01-14 NOTE — Progress Notes (Signed)
   [facility] Phone: [phone]   Date of Visit: 01/15/2016   HPI: Patient is here for TOPc clinic to discuss her overall health. Dr. Deirdre Priest, attending preceptor also present for visit.   Of note: she had a recent dmission to [facility] in [place]. She was admitted on 01/03/16 to 01/08/16 for alcohol treatment . Patient discharged with the following medications: Prozac 20mg  daily (depression), Gabapentin 400mg  TID ("mood"), Risperdal 0.5mg  at bedtime ("mood"). The discharge summary reports she completed detox while hospitalized. She reported history of multiple falls recently; she used wheelchair.   Patient is here today with her mother today. Anne Black and her mother report that Anne Black has returned to drinking after being discharged from [facility]. She reports that she use alcohol to treat her MS pain which she knows "is not the best thing to do". She states that she does not have the will power to be sober and live alone in her home.   They report that patient has been to prior detox centers. She was in [facility] detox for 2 weeks but the facility reported that they are "not capable of taking care of her as they are not assisted living". Anne Black needs help with ambulation at times and she has multiple falls. She also has been to [facility] and rehab where she did very well as she was able to work with PT there but she "chose to come home". Her mother has called this facility again to attempt to get her re-admitted, however they have not returned her call. Her mother is frustrated as it seems that rehab facilities are not willing to accept Anne Black because of her active alcohol use, but detox facilities are concerned that they will not be able to handle her medical needs. We have established communication with Anne Black, clinic Child psychotherapist. She has given them a list of resources they can contact. So far, they have not found  anything promising.   ROS: See HPI.  PMFSH:  PMH: GERD Alcoholic Cirrhosis of Liver with Recurrent Ascites Hyperprolactinemia Unspecified Peripheral Neuropathy Multiple Sclerosis Osteoporosis Alcohol Use Disorder Dyslipidemia Tobacco Use Anxiety Bipolar Disorder Severe Protein Calorie Malnutrition   PHYSICAL EXAM: BP 130/84   Pulse 79   Temp 98.1 F (36.7 C) (Oral)   Ht 5\' 2"  (1.575 m)   Wt 166 lb (75.3 kg)   BMI 30.36 kg/m  Gen: NAD, tearful when talking about her alcoholism  ASSESSMENT/PLAN:  Anne Black is a medically complex patient who is in an unfortunate situation. If she does not stop drinking, we will not be able to make progress in managing her medical diagnoses/needs. Thus, the first step is for her to detox from alcohol and stop drinking. Secondly, there is some concern that she is noncompliant with her medication and may take certain medications intermittently. Currently her medication list is not up to date as we are not sure which medications she is taking and not taking. We discussed that she should return to clinic as soon as possible with all her medications for medication management. After our discuss, it may be beneficial if we can write a letter summarizing her medial diagnoses for the detox/rehab facilities if this will help her get accepted into one. She has recent labs done last month; we will obtain PT/INR.   [name], MD PGY 2 [facility]

## 2016-01-15 ENCOUNTER — Encounter: Payer: Self-pay | Admitting: Internal Medicine

## 2016-01-15 ENCOUNTER — Ambulatory Visit (INDEPENDENT_AMBULATORY_CARE_PROVIDER_SITE_OTHER): Payer: Managed Care, Other (non HMO) | Admitting: Internal Medicine

## 2016-01-15 ENCOUNTER — Encounter: Payer: Self-pay | Admitting: Licensed Clinical Social Worker

## 2016-01-15 VITALS — BP 130/84 | HR 79 | Temp 98.1°F | Ht 62.0 in | Wt 166.0 lb

## 2016-01-15 DIAGNOSIS — Z7189 Other specified counseling: Secondary | ICD-10-CM

## 2016-01-15 DIAGNOSIS — F1010 Alcohol abuse, uncomplicated: Secondary | ICD-10-CM | POA: Diagnosis not present

## 2016-01-15 DIAGNOSIS — K7031 Alcoholic cirrhosis of liver with ascites: Secondary | ICD-10-CM

## 2016-01-15 NOTE — Patient Instructions (Signed)
Please make a follow up visit to discuss your medications as soon as you can make an appointment with me. Please bring all of your medications to this visit. We will check your blood work today.

## 2016-01-15 NOTE — Progress Notes (Signed)
Patient and mother Ms. Harley requested to meet with LCSW  after office visit with PCP.  Mother is asking for assistance in locating a Skilled Nursing Facility for patient that will provide physical therapy.  Per Ms. Hatley in order to get patient to a substance treatment facility, she has to go to SNF first.  LCSW assessed patient for level of committee for treatment.  Last drink was today.  States started having withdrawals yesterday, which consisted of nausea, shaking, trimmers and extreme sadness. Patient states willing to go to inpatient treatment for alcohol, " I need help". Per mother, Patient was unable to stay at previous facility due to unsteady gait and not independent of ADL's.  Patient has an appointment with PCP next week for medication evaluation.    Plan: LCSW will consult with PCP on patient's needs and how to best assist.   Sammuel Hines, LCSW Licensed Clinical Social Worker [facility]   [phone] 11:20 AM

## 2016-01-16 LAB — PROTIME-INR
INR: 1.1
PROTHROMBIN TIME: 11.9 s — AB (ref 9.0–11.5)

## 2016-01-21 NOTE — Progress Notes (Deleted)
   [facility] Phone: [phone]   Date of Visit: 01/22/2016   HPI:  ***  ROS: See HPI.  PMFSH:  PMH: GERD Alcoholic Cirrhosis of Liver with Recurrent Ascites Hyperprolactinemia Unspecified Peripheral Neuropathy Multiple Sclerosis Osteoporosis Alcohol Use Disorder Dyslipidemia Tobacco Use Anxiety Bipolar Disorder Severe Protein Calorie Malnutrition  PHYSICAL EXAM: There were no vitals taken for this visit. Gen: *** HEENT: *** Heart: *** Lungs: *** Neuro: *** Ext: ***  ASSESSMENT/PLAN:  Health maintenance:  -***  No problem-specific Assessment & Plan notes found for this encounter.  FOLLOW UP: Follow up in *** for ***  Palma HolterKanishka G Gunadasa, MD PGY 2 [facility]

## 2016-01-22 ENCOUNTER — Ambulatory Visit: Payer: Self-pay | Admitting: Internal Medicine

## 2016-01-22 ENCOUNTER — Encounter: Payer: Self-pay | Admitting: Family Medicine

## 2016-01-22 ENCOUNTER — Emergency Department (HOSPITAL_COMMUNITY): Payer: Managed Care, Other (non HMO)

## 2016-01-22 ENCOUNTER — Other Ambulatory Visit: Payer: Self-pay

## 2016-01-22 ENCOUNTER — Inpatient Hospital Stay (HOSPITAL_COMMUNITY)
Admission: EM | Admit: 2016-01-22 | Discharge: 2016-02-06 | DRG: 433 | Disposition: A | Discharge status: Skilled Nursing Facility | Payer: Managed Care, Other (non HMO) | Attending: Family Medicine | Admitting: Family Medicine

## 2016-01-22 ENCOUNTER — Encounter (HOSPITAL_COMMUNITY): Payer: Self-pay | Admitting: *Deleted

## 2016-01-22 ENCOUNTER — Other Ambulatory Visit (HOSPITAL_COMMUNITY): Payer: Self-pay

## 2016-01-22 ENCOUNTER — Ambulatory Visit (INDEPENDENT_AMBULATORY_CARE_PROVIDER_SITE_OTHER): Payer: Managed Care, Other (non HMO) | Admitting: Family Medicine

## 2016-01-22 DIAGNOSIS — F17200 Nicotine dependence, unspecified, uncomplicated: Secondary | ICD-10-CM | POA: Diagnosis present

## 2016-01-22 DIAGNOSIS — K7200 Acute and subacute hepatic failure without coma: Secondary | ICD-10-CM | POA: Diagnosis present

## 2016-01-22 DIAGNOSIS — F319 Bipolar disorder, unspecified: Secondary | ICD-10-CM | POA: Diagnosis present

## 2016-01-22 DIAGNOSIS — E875 Hyperkalemia: Secondary | ICD-10-CM | POA: Diagnosis present

## 2016-01-22 DIAGNOSIS — F1020 Alcohol dependence, uncomplicated: Secondary | ICD-10-CM

## 2016-01-22 DIAGNOSIS — K7031 Alcoholic cirrhosis of liver with ascites: Secondary | ICD-10-CM

## 2016-01-22 DIAGNOSIS — R0602 Shortness of breath: Secondary | ICD-10-CM

## 2016-01-22 DIAGNOSIS — K7040 Alcoholic hepatic failure without coma: Secondary | ICD-10-CM | POA: Diagnosis present

## 2016-01-22 DIAGNOSIS — G8929 Other chronic pain: Secondary | ICD-10-CM | POA: Diagnosis present

## 2016-01-22 DIAGNOSIS — R188 Other ascites: Secondary | ICD-10-CM

## 2016-01-22 DIAGNOSIS — K219 Gastro-esophageal reflux disease without esophagitis: Secondary | ICD-10-CM | POA: Diagnosis present

## 2016-01-22 DIAGNOSIS — E876 Hypokalemia: Secondary | ICD-10-CM

## 2016-01-22 DIAGNOSIS — I4892 Unspecified atrial flutter: Secondary | ICD-10-CM

## 2016-01-22 DIAGNOSIS — Z79899 Other long term (current) drug therapy: Secondary | ICD-10-CM | POA: Diagnosis not present

## 2016-01-22 DIAGNOSIS — Z7189 Other specified counseling: Secondary | ICD-10-CM

## 2016-01-22 DIAGNOSIS — I483 Typical atrial flutter: Secondary | ICD-10-CM | POA: Diagnosis present

## 2016-01-22 DIAGNOSIS — R000 Tachycardia, unspecified: Secondary | ICD-10-CM | POA: Diagnosis not present

## 2016-01-22 DIAGNOSIS — K7682 Hepatic encephalopathy: Secondary | ICD-10-CM | POA: Diagnosis present

## 2016-01-22 DIAGNOSIS — N390 Urinary tract infection, site not specified: Secondary | ICD-10-CM | POA: Diagnosis present

## 2016-01-22 DIAGNOSIS — E8770 Fluid overload, unspecified: Secondary | ICD-10-CM | POA: Diagnosis present

## 2016-01-22 DIAGNOSIS — R109 Unspecified abdominal pain: Secondary | ICD-10-CM | POA: Diagnosis not present

## 2016-01-22 DIAGNOSIS — R0902 Hypoxemia: Secondary | ICD-10-CM | POA: Diagnosis present

## 2016-01-22 DIAGNOSIS — F419 Anxiety disorder, unspecified: Secondary | ICD-10-CM | POA: Diagnosis present

## 2016-01-22 DIAGNOSIS — N179 Acute kidney failure, unspecified: Secondary | ICD-10-CM | POA: Diagnosis present

## 2016-01-22 DIAGNOSIS — E872 Acidosis: Secondary | ICD-10-CM | POA: Diagnosis present

## 2016-01-22 DIAGNOSIS — K7290 Hepatic failure, unspecified without coma: Secondary | ICD-10-CM

## 2016-01-22 DIAGNOSIS — F10939 Alcohol use, unspecified with withdrawal, unspecified: Secondary | ICD-10-CM | POA: Diagnosis present

## 2016-01-22 DIAGNOSIS — K766 Portal hypertension: Secondary | ICD-10-CM | POA: Diagnosis present

## 2016-01-22 DIAGNOSIS — F10231 Alcohol dependence with withdrawal delirium: Secondary | ICD-10-CM | POA: Diagnosis not present

## 2016-01-22 DIAGNOSIS — E860 Dehydration: Secondary | ICD-10-CM | POA: Diagnosis not present

## 2016-01-22 DIAGNOSIS — K709 Alcoholic liver disease, unspecified: Secondary | ICD-10-CM | POA: Diagnosis not present

## 2016-01-22 DIAGNOSIS — R0682 Tachypnea, not elsewhere classified: Secondary | ICD-10-CM

## 2016-01-22 DIAGNOSIS — Z515 Encounter for palliative care: Secondary | ICD-10-CM

## 2016-01-22 DIAGNOSIS — F1010 Alcohol abuse, uncomplicated: Secondary | ICD-10-CM | POA: Diagnosis not present

## 2016-01-22 DIAGNOSIS — Z9119 Patient's noncompliance with other medical treatment and regimen: Secondary | ICD-10-CM

## 2016-01-22 DIAGNOSIS — R140 Abdominal distension (gaseous): Secondary | ICD-10-CM

## 2016-01-22 DIAGNOSIS — F17210 Nicotine dependence, cigarettes, uncomplicated: Secondary | ICD-10-CM | POA: Diagnosis not present

## 2016-01-22 DIAGNOSIS — R52 Pain, unspecified: Secondary | ICD-10-CM | POA: Diagnosis not present

## 2016-01-22 DIAGNOSIS — I959 Hypotension, unspecified: Secondary | ICD-10-CM | POA: Diagnosis present

## 2016-01-22 DIAGNOSIS — K7011 Alcoholic hepatitis with ascites: Secondary | ICD-10-CM | POA: Diagnosis not present

## 2016-01-22 DIAGNOSIS — R300 Dysuria: Secondary | ICD-10-CM | POA: Diagnosis present

## 2016-01-22 DIAGNOSIS — B964 Proteus (mirabilis) (morganii) as the cause of diseases classified elsewhere: Secondary | ICD-10-CM | POA: Diagnosis present

## 2016-01-22 DIAGNOSIS — F10239 Alcohol dependence with withdrawal, unspecified: Secondary | ICD-10-CM | POA: Diagnosis present

## 2016-01-22 DIAGNOSIS — R0600 Dyspnea, unspecified: Secondary | ICD-10-CM

## 2016-01-22 DIAGNOSIS — R1013 Epigastric pain: Secondary | ICD-10-CM | POA: Diagnosis not present

## 2016-01-22 DIAGNOSIS — G35 Multiple sclerosis: Secondary | ICD-10-CM | POA: Diagnosis present

## 2016-01-22 DIAGNOSIS — Z8249 Family history of ischemic heart disease and other diseases of the circulatory system: Secondary | ICD-10-CM | POA: Diagnosis not present

## 2016-01-22 LAB — COMPREHENSIVE METABOLIC PANEL
ALBUMIN: 2.9 g/dL — AB (ref 3.5–5.0)
ALT: 20 U/L (ref 14–54)
ANION GAP: 20 — AB (ref 5–15)
AST: 50 U/L — AB (ref 15–41)
Alkaline Phosphatase: 186 U/L — ABNORMAL HIGH (ref 38–126)
BILIRUBIN TOTAL: 3.6 mg/dL — AB (ref 0.3–1.2)
CHLORIDE: 82 mmol/L — AB (ref 101–111)
CO2: 29 mmol/L (ref 22–32)
Calcium: 8.3 mg/dL — ABNORMAL LOW (ref 8.9–10.3)
Creatinine, Ser: 0.76 mg/dL (ref 0.44–1.00)
GFR calc Af Amer: >60 mL/min (ref >60)
Glucose, Bld: 128 mg/dL — ABNORMAL HIGH (ref 65–99)
Sodium: 131 mmol/L — ABNORMAL LOW (ref 135–145)
TOTAL PROTEIN: 6.8 g/dL (ref 6.5–8.1)

## 2016-01-22 LAB — CBC
HEMATOCRIT: 36.2 % (ref 36.0–46.0)
HEMOGLOBIN: 11.7 g/dL — AB (ref 12.0–15.0)
MCH: 29.8 pg (ref 26.0–34.0)
MCHC: 32.3 g/dL (ref 30.0–36.0)
MCV: 92.1 fL (ref 78.0–100.0)
Platelets: 230 10*3/uL (ref 150–400)
RBC: 3.93 MIL/uL (ref 3.87–5.11)
RDW: 16.7 % — AB (ref 11.5–15.5)
WBC: 8.5 10*3/uL (ref 4.0–10.5)

## 2016-01-22 LAB — I-STAT TROPONIN, ED: Troponin i, poc: 0 ng/mL (ref 0.00–0.08)

## 2016-01-22 LAB — AMMONIA: AMMONIA: 84 umol/L — AB (ref 9–35)

## 2016-01-22 LAB — MAGNESIUM: MAGNESIUM: 1.5 mg/dL — AB (ref 1.7–2.4)

## 2016-01-22 LAB — I-STAT CG4 LACTIC ACID, ED: LACTIC ACID, VENOUS: 1.96 mmol/L — AB (ref 0.5–1.9)

## 2016-01-22 LAB — LIPASE, BLOOD: LIPASE: 21 U/L (ref 11–51)

## 2016-01-22 LAB — ETHANOL

## 2016-01-22 MED ORDER — SODIUM CHLORIDE 0.9 % IV SOLN
30.0000 meq | Freq: Once | INTRAVENOUS | Status: AC
  Administered 2016-01-22: 30 meq via INTRAVENOUS
  Filled 2016-01-22: qty 15

## 2016-01-22 MED ORDER — SODIUM CHLORIDE 0.9 % IV SOLN
INTRAVENOUS | Status: DC
  Administered 2016-01-22: 23:00:00 via INTRAVENOUS

## 2016-01-22 MED ORDER — MAGNESIUM SULFATE 2 GM/50ML IV SOLN
2.0000 g | Freq: Once | INTRAVENOUS | Status: AC
  Administered 2016-01-22: 2 g via INTRAVENOUS
  Filled 2016-01-22: qty 50

## 2016-01-22 MED ORDER — ONDANSETRON HCL 4 MG/2ML IJ SOLN
4.0000 mg | Freq: Once | INTRAMUSCULAR | Status: AC
  Administered 2016-01-22: 4 mg via INTRAVENOUS
  Filled 2016-01-22: qty 2

## 2016-01-22 MED ORDER — THIAMINE HCL 100 MG/ML IJ SOLN
100.0000 mg | Freq: Every day | INTRAMUSCULAR | Status: DC
  Administered 2016-01-23 (×2): 100 mg via INTRAVENOUS
  Filled 2016-01-22 (×2): qty 2

## 2016-01-22 MED ORDER — ADULT MULTIVITAMIN W/MINERALS CH
1.0000 | ORAL_TABLET | Freq: Every day | ORAL | Status: DC
  Administered 2016-01-24 – 2016-02-06 (×14): 1 via ORAL
  Filled 2016-01-22 (×15): qty 1

## 2016-01-22 MED ORDER — ONDANSETRON HCL 4 MG PO TABS: 4.0000 mg | ORAL_TABLET | Freq: Three times a day (TID) | ORAL | 0 refills | Status: DC | PRN

## 2016-01-22 MED ORDER — LORAZEPAM 2 MG/ML IJ SOLN
1.0000 mg | Freq: Four times a day (QID) | INTRAMUSCULAR | Status: DC | PRN
  Administered 2016-01-23: 1 mg via INTRAVENOUS
  Filled 2016-01-22: qty 1

## 2016-01-22 MED ORDER — SODIUM CHLORIDE 0.9 % IV BOLUS (SEPSIS)
1000.0000 mL | Freq: Once | INTRAVENOUS | Status: AC
  Administered 2016-01-22: 1000 mL via INTRAVENOUS

## 2016-01-22 MED ORDER — PANTOPRAZOLE SODIUM 40 MG PO TBEC
40.0000 mg | DELAYED_RELEASE_TABLET | Freq: Every day | ORAL | Status: DC
  Administered 2016-01-24: 40 mg via ORAL
  Filled 2016-01-22 (×2): qty 1

## 2016-01-22 MED ORDER — METOPROLOL TARTRATE 5 MG/5ML IV SOLN
2.5000 mg | Freq: Once | INTRAVENOUS | Status: AC
  Administered 2016-01-22: 2.5 mg via INTRAVENOUS
  Filled 2016-01-22: qty 5

## 2016-01-22 MED ORDER — VITAMIN B-1 100 MG PO TABS
100.0000 mg | ORAL_TABLET | Freq: Every day | ORAL | Status: DC
  Administered 2016-01-24 – 2016-02-03 (×12): 100 mg via ORAL
  Filled 2016-01-22 (×12): qty 1

## 2016-01-22 MED ORDER — LORAZEPAM 1 MG PO TABS: 1.0000 mg | ORAL_TABLET | Freq: Four times a day (QID) | ORAL | Status: DC | PRN

## 2016-01-22 MED ORDER — SODIUM CHLORIDE 0.9 % IV SOLN: INTRAVENOUS | Status: DC

## 2016-01-22 MED ORDER — FOLIC ACID 1 MG PO TABS
1.0000 mg | ORAL_TABLET | Freq: Every day | ORAL | Status: DC
  Administered 2016-01-24 – 2016-02-06 (×14): 1 mg via ORAL
  Filled 2016-01-22 (×14): qty 1

## 2016-01-22 NOTE — ED Notes (Addendum)
O2 sats dropping to 84/85% on RA, placed pt on 4LNC, sats now at 97%

## 2016-01-22 NOTE — Assessment & Plan Note (Addendum)
Worsened.  Informed patient and mother that the best hope for any recovery it to go through detox and rehab preferably in the same place.   She will certainly die if she continues drinking. This could happen tomorrow or in a few months.  Patient wished to go home and try to cut back on alcohol and drink more fluids and try to eat and continue to look for a rehab.  I informed them if she worsens she should go to the ER and offered admission now but patient declined.  Very poor prognosis.  Told not to take any medications other than her zofran, multivitamin and PPI. They will follow up with Dr Ottie Glazier

## 2016-01-22 NOTE — ED Notes (Signed)
Family practice at bedside.

## 2016-01-22 NOTE — ED Notes (Signed)
Pt's belongings and purse given to pt's ex-husband to take home

## 2016-01-22 NOTE — ED Notes (Signed)
Family at bedside. 

## 2016-01-22 NOTE — Patient Instructions (Signed)
If you chose to go home you should cut back on drinking alcohol and drink plenty of fluids and eat some food  If you can find yourself a detox and rehab that would be the best possible care for you.  You are at high risk for death if you do not stop drinking very soon

## 2016-01-22 NOTE — Progress Notes (Signed)
Received report from [facility]

## 2016-01-22 NOTE — ED Notes (Signed)
EDP is at bedside. 

## 2016-01-22 NOTE — H&P (Signed)
[facility] Admission History and Physical Service Pager: [phone]  Patient name: Anne Black Medical record number: [id number] Date of birth: Feb 01, 1961 Age: 55 y.o. Gender: female  Primary Care Provider: Smiley Houseman, MD Consultants:  Code Status: FULL  Chief Complaint: generalized pain, dehydration, abdominal ascites   Assessment and Plan: Anne Black is a 55 y.o. female presenting with dehydration, generalized pain, ascites. PMH is significant for Alcoholic Liver Cirrhosis with Ascites, Alcohol Abuse, Bipolar DO, anxiety, GERD.   Dehydration: Patient reports no PO intake for the past few days other than alcohol. Reports only having dry heaves and not vomiting. S/p 1L bolus in ED. She seems intracellularly depleted as she does have significant ascites and mild LE edema bilaterally. However, her overall weight is lower compared to one week ago (152lb from 162).  - admit to telemetry, attending Dr. Andria Frames  - NS @ 125cc/hr x 12 hours then re-evaluate   Lactic Acidosis, Mild: Possibly due to dehydration. Clinical picture is not consistent with sepsis as a cause of her lactic acidosis. Possibly contributed by her liver disease.  - IVF as above - repeat lactic acid level  Alcoholic Cirrhosis with Ascites: Diagnosed in 05/2015 with negative hepatitis panel and autoimmune labs at that time. No history of SBP. Patient does report of generalized abdominal pain/discomfort, but possibly due to distention instead of SBP in the setting of a normal white blood cell count and no fevers. Ammonia is more elevated than prior values at 84; no signs of hepatic encephalopathy. It seems that patient has not been taking Lactulose, Rifaximin Lasix or Spironolactone. Does not seem to be regularly followed by GI.   - PT/INR  - abdominal ultrasound ordered  - consider diagnostic paracentesis in the morning if pain worsens  - will hold Lactulose, Lasix, and Spironolactone as  patient seems she is clinically dehydrated. Consider starting in the AM after IVF repletion  - consider establishing care with GI in [facility] prior to discharge   Hypokalemia: less than 2 in the ED. Can be due to patient having possible emesis at home along with poor PO intake - IV K 51mq x 1  - repeat BMP this evening   Hypomagnesemia: 1.5 s/p repletion in ED - repeat Mag level in AM   Decreased Air Movement/Shortness of breath: Patient endorses feeling short of breath for uncertain period of time. Normal work of breathing and on room air, but decreased air movement on lung exam with distant crackles  - CXR   Generalized Pain: Reports of pain in back and lower extremities on palpation.  - will obtain CK level to evaluate for possible alcoholic myopathy  Mild Hypotension, resolved: Likely due to dehydration. Resolved with IVF - monitor   Alcohol Abuse: - CIWA protocol  [facility] Currently not [facility]   Bipolar Disorder and Anxiety:  - Prozac 256mdaily, Neurontin 40025mID, Risperdal 0.5mg66mnd Topamax on med list but uncertain if patient is taking or should be taking. Recently discharged [facility] with the following mediction : Prozac 20mg52mly, Gabapentin 400mg 75m and Risperdal 0.5mg at9mdtime.  - will order Gabapentin 400mg TI52mnd Risperdal 0.5mg at b82mime. Hold Prozac for now due to QT prolongation (below)  History of Prolonged QTC: QTc 532 - avoid QT prolonging medications  - repeat EKG in AM   FEN/GI: NPO, IVF NS @ 125 x 12 hours  Prophylaxis: holding pharmacologic anticoagulation until PT-INR returns  Disposition: admit to FPTS   History of Present Illness:  Anne Black is a 55 y.o. female with alcoholic cirrhosis and ongoing alcohol abuse who presents to [facility] with worsening generalized abdominal pain in the setting of a week long alcohol binge.  Reportedly drinks daily and increased her intake  in the last week. Has had virtually no PO intake for past 3 days aside from alcohol. Had been wretching for past 5 days or so and denies any blood or coffee ground emesis. Was seen in PCP office on the day of admission and was noted to have HR to 150s and when she got home called 911.    In ED her heart rate was initially 147 and rhythm showed a-flutter.  Patient was asymptomatic. She was given metoprolol 2.64m IV and converted to NSR and rate decreased to WNL. EKG showed prolonged QT to 532 and non-specific ST changes.  BMP showed K+ <2 and Mag 1.5 and was given potassium and magnesium.  Per ED physician note, patient has not taken any of her medications in the past 1-2 weeks. She does endorse diffuse body tenderness and denies any falls.  Denies CP, fevers, chills, cough, swelling in extremities or dysuria.   Review Of Systems: Per HPI   ROS  Patient Active Problem List   Diagnosis Date Noted  . Hypomagnesemia   . Dehydration 01/22/2016  . Low back ache 12/30/2015  . Non-intractable vomiting with nausea   . Alcohol withdrawal ([place] 12/15/2015  . Acute alcoholism ([facility]   . Alcohol abuse   . Shortness of breath   . Acute respiratory failure ([place] 10/15/2015  . Ascites 10/15/2015  . Osteoporosis   . Bipolar I disorder ([place]   . Multiple sclerosis ([place]   . Alcoholic cirrhosis of liver with ascites ([place] 08/22/2015  . Bipolar disorder, current episode mixed, moderate ([place] 08/01/2015  . Alcohol use disorder, severe, dependence (HW[place] 07/31/2015  . Macrocytic anemia- due to alcohol abuse with normal B12 & folate levels 05/31/2015  . Severe protein-calorie malnutrition (HC[place] 05/31/2015  . Hypokalemia 05/26/2015  . Encephalopathy, hepatic ([place] 05/26/2015  . Anxiety 05/18/2015  . Sepsis ([place] 05/18/2015  . UTI (lower urinary tract infection) 05/18/2015  . Stimulant abuse 12/27/2014  . Nicotine abuse 12/27/2014  . Hyperprolactinemia ([place] 11/15/2014  . Dyslipidemia   . Noncompliance  with therapeutic plan 04/04/2013  . Unspecified hereditary and idiopathic peripheral neuropathy 05/01/2012  . GERD (gastroesophageal reflux disease) 05/01/2012  . Osteoarthrosis, unspecified whether generalized or localized, involving lower leg 05/01/2012    Past Medical History: Past Medical History:  Diagnosis Date  . ADHD (attention deficit hyperactivity disorder)   . Anxiety   . Arthritis   . Ascites   . Bipolar disorder (HW[place]   . Cirrhosis ([place]   . Hypokalemia   . Migraine   . Multiple sclerosis (HW[place]   . Osteoporosis     Past Surgical History: Past Surgical History:  Procedure Laterality Date  . CESAREAN SECTION  [id number] . MYRINGOTOMY WITH TUBE PLACEMENT Bilateral   . TONSILLECTOMY      Social History: Social History  Substance Use Topics  . Smoking status: Current Every Day Smoker    Packs/day: 0.75    Years: 20.00    Types: Cigarettes  . Smokeless tobacco: Never Used     Comment: is cutting back, not in the car, house  . Alcohol use 4.8 oz/week    2 Glasses of wine, 2 Cans of beer, 4 Standard drinks  or equivalent per week     Comment: 10/2015   " I drink occasionaly "   Please also refer to relevant sections of EMR.  Family History: Family History  Problem Relation Age of Onset  . Heart disease Mother   . Heart disease Father   . Hypertension Father     Allergies and Medications: No Known Allergies No current facility-administered medications on file prior to encounter.    Current Outpatient Prescriptions on File Prior to Encounter  Medication Sig Dispense Refill  . albuterol (PROVENTIL HFA;VENTOLIN HFA) 108 (90 Base) MCG/ACT inhaler Inhale 2 puffs into the lungs every 6 (six) hours as needed for wheezing or shortness of breath.    Marland Kitchen amantadine (SYMMETREL) 100 MG capsule Take 1 capsule (100 mg total) by mouth 2 (two) times daily. (Patient not taking: Reported on 12/26/2015) 30 capsule 0  . baclofen (LIORESAL) 10 MG tablet Take 1 tablet (10 mg  total) by mouth 3 (three) times daily as needed for muscle spasms. (Patient not taking: Reported on 12/26/2015) 30 each 0  . chlordiazePOXIDE (LIBRIUM) 25 MG capsule 71m PO TID x 1D, then 25-570mPO BID X 1D, then 25-5062mO QD X 1D 10 capsule 0  . feeding supplement, ENSURE ENLIVE, (ENSURE ENLIVE) LIQD Take 237 mLs by mouth 2 (two) times daily between meals. (Patient not taking: Reported on 12/26/2015) 237 mL 12  . feeding supplement, ENSURE ENLIVE, (ENSURE ENLIVE) LIQD Take 237 mLs by mouth daily. (Patient not taking: Reported on 12/26/2015) 237 mL 12  . FLUoxetine (PROZAC) 20 MG capsule Take 20 mg by mouth daily.   0  . Fluticasone-Salmeterol (ADVAIR) 250-50 MCG/DOSE AEPB Inhale 1 puff into the lungs 2 (two) times daily.    . folic acid (FOLVITE) 1 MG tablet Take 1 tablet (1 mg total) by mouth daily. 30 tablet 1  . furosemide (LASIX) 40 MG tablet Take 1 tablet (40 mg total) by mouth daily. (Patient not taking: Reported on 12/26/2015) 30 tablet 1  . gabapentin (NEURONTIN) 100 MG capsule Take 2 capsules (200 mg total) by mouth 3 (three) times daily. 180 capsule 0  . hydrOXYzine (ATARAX/VISTARIL) 10 MG tablet Take 1 tablet (10 mg total) by mouth 3 (three) times daily as needed. 30 tablet 0  . lactulose (CHRONULAC) 10 GM/15ML solution Take 30 mLs (20 g total) by mouth 2 (two) times daily. (Patient not taking: Reported on 12/26/2015) 240 mL 0  . Melatonin 3 MG TABS Take 1 tablet (3 mg total) by mouth at bedtime as needed (sleep). 30 tablet 0  . meloxicam (MOBIC) 7.5 MG tablet Take 1 tablet (7.5 mg total) by mouth daily. 30 tablet 0  . miconazole (MICOTIN) 100 MG vaginal suppository Place 1 suppository (100 mg total) vaginally at bedtime. (Patient not taking: Reported on 12/26/2015) 7 suppository 0  . Multiple Vitamin (MULTIVITAMIN WITH MINERALS) TABS tablet Take 1 tablet by mouth daily. (Patient not taking: Reported on 12/26/2015) 30 tablet 1  . nicotine (NICODERM CQ - DOSED IN MG/24 HOURS) 21 mg/24hr  patch Place 1 patch (21 mg total) onto the skin daily. (Patient not taking: Reported on 12/18/2015) 28 patch 0  . ondansetron (ZOFRAN ODT) 4 MG disintegrating tablet 4mg74mT q4 hours prn nausea/vomit (Patient not taking: Reported on 12/26/2015) 10 tablet 0  . ondansetron (ZOFRAN) 4 MG tablet Take 1 tablet (4 mg total) by mouth every 8 (eight) hours as needed for nausea or vomiting. 20 tablet 0  . pantoprazole (PROTONIX) 40 MG tablet Take  40 mg by mouth daily.    . pantoprazole (PROTONIX) 40 MG tablet Take 1 tablet (40 mg total) by mouth daily. 30 tablet 3  . polyethylene glycol (MIRALAX / GLYCOLAX) packet Take 17 g by mouth daily as needed for mild constipation. (Patient not taking: Reported on 12/26/2015) 14 each 0  . potassium chloride 20 MEQ TBCR Take 40 mEq by mouth daily. 60 tablet 0  . rifaximin (XIFAXAN) 550 MG TABS tablet Take 1 tablet (550 mg total) by mouth 2 (two) times daily. (Patient not taking: Reported on 12/26/2015) 60 tablet 0  . risperiDONE (RISPERDAL) 0.5 MG tablet Take 1 tablet (0.5 mg total) by mouth at bedtime. 30 tablet 0  . spironolactone (ALDACTONE) 100 MG tablet Take 1 tablet (100 mg total) by mouth daily. (Patient not taking: Reported on 12/18/2015) 30 tablet 1  . topiramate (TOPAMAX) 100 MG tablet Take 1 tablet (100 mg total) by mouth daily. 30 tablet 0    Objective: BP 122/62 (BP Location: Right Arm)   Pulse 100   Temp 97.5 F (36.4 C) (Oral)   Resp 18   SpO2 99%  Exam: General: Ill-appearing 55 year old female lying in bed in NAD Eyes: EOMI, PERRL, non-injected ENTM: MMM Neck: supple, no LAD Cardiovascular: Tachycardic, regular rhytym, intact distal pulses Respiratory: NWOB, poor air movement, faint bibasilar crackles, no wheezing or rhonchi apparent Gastrointestinal: marked ascities, soft, moderate tenderness, no peritoneal signs and no guarding, +BS MSK: no deformities or swelling, thoracic tenderness from T10/T12 to lumbar spine, no paraspinal tenderness,  able to flex trunk for exam Derm: warm and dry Neuro: AAOx3, sensation intact, strength 5/5 upper and lower extremities Psych: Normal mood and affect  Labs and Imaging: CBC BMET   Recent Labs Lab 01/23/16 0132  WBC 6.7  HGB 10.4*  HCT 32.4*  PLT 191    Recent Labs Lab 01/22/16 2047  NA 131*  K <2.0*  CL 82*  CO2 29  BUN <5*  CREATININE 0.76  GLUCOSE 128*  CALCIUM 8.3*    Ammonia 84 Lactic Acid 1.96 EtOH: <5 Alk Phos 186 AST 50 ALT 20 Magnesium 1.5 Lipase 21 EKG: NSR, no significant ST changes, QTc 532  Dg Chest Port 1 View  Result Date: 01/22/2016 CLINICAL DATA:  55 y/o  F; shortness of breath. EXAM: PORTABLE CHEST 1 VIEW COMPARISON:  12/18/2015 chest radiograph FINDINGS: Stable cardiac silhouette within normal limits. Prominent epicardial fat pad. No focal consolidation. No pleural effusion. Bones are unremarkable. IMPRESSION: No active disease. Electronically Signed   By: Kristine Garbe M.D.   On: 01/22/2016 23:48    Eloise Levels, MD 01/23/2016, 1:50 AM PGY-1, [place] [facility] pager: [phone], text pages welcome  UPPER LEVEL ADDENDUM  I have read the above note and made revisions highlighted in blue.  Smiley Houseman, MD PGY-2 [facility] Pager [phone]

## 2016-01-22 NOTE — Progress Notes (Signed)
Subjective  Patient arrived 30 minutes after scheduled appointment because was not ready when Mom arrived to pick her up.  Has been drinking alcohol steadily since last visit.  Mom does not think she has been eating or drinking much besides alcohol.  They have not been able to find a detox facility. Patient has discomfort from her ascites and occsl vomiting. No bleeding No severe shortness of breath or loss of consciousness.  Feels her jaundice is worse  Chief Complaint noted Review of Symptoms - see HPI PMH - Smoking status noted.     Objective Vital Signs reviewed Patient is ill appearing and walks slowly with a walker but without assistance Oriented to person, place, Mother full name, president year.   Able to say the her ascites is due to her liver failure and that is due to her alcohol use. Able to drink a cup of water slowly without vomiting. Does make retching sounds every so often Pulse 150 regular MM - dry  Spent from 4PM to 450 with patient and mom the majority in counseling   Assessments/Plans  No problem-specific Assessment & Plan notes found for this encounter.   See Encounter view if individual problem A/Ps not visible See after visit summary for details of patient instuctions

## 2016-01-22 NOTE — ED Notes (Signed)
Pt reports chronic back pain, offered to assist pt change position, which she declined at this time.

## 2016-01-22 NOTE — ED Provider Notes (Signed)
[facility]-EMERGENCY DEPT Provider Note   CSN: [id number] Arrival date & time: 01/22/16  2034     History   Chief Complaint Chief Complaint  Patient presents with  . Abdominal Pain    HPI Anne Black is a 55 y.o. female.  Patient is a 55 year old female with a history of cirrhosis, hypokalemia, alcohol abuse, and bipolar disease presenting today with over a week stream worsening generalized abdominal pain, vomiting, generalized weakness and nausea. Patient usually drinks daily however in the last 6 days a friend has come into town and she has had more alcohol intake than normal. In the last 2 days she has not had any by mouth intake. She has had persistent nausea and gagging. She has not taken any of her medications in approximately 1-2 weeks and had stopped taking lactulose because it gave her diarrhea.  She denies fever, cough or distal edema. She was seen by her PCP today and because she was feeling worse when she got home she called 911. Last alcohol intake was this morning   The history is provided by the patient.  Abdominal Pain   This is a recurrent problem. Episode onset: 3-4 days ago. The problem occurs constantly. The problem has been gradually worsening. The pain is associated with alcohol use. The pain is located in the generalized abdominal region. The pain is at a severity of 3/10. The pain is mild. Associated symptoms include anorexia, nausea and vomiting. Pertinent negatives include fever, diarrhea, constipation and dysuria. The symptoms are aggravated by drinking alcohol and eating. Nothing relieves the symptoms.    Past Medical History:  Diagnosis Date  . ADHD (attention deficit hyperactivity disorder)   . Anxiety   . Arthritis   . Ascites   . Bipolar disorder (HCC)   . Cirrhosis (HCC)   . Hypokalemia   . Migraine   . Multiple sclerosis (HCC)   . Osteoporosis     Patient Active Problem List   Diagnosis Date Noted  . Low back ache 12/30/2015  .  Non-intractable vomiting with nausea   . Alcohol withdrawal (HCC) 12/15/2015  . Acute alcoholism (HCC)   . Alcohol abuse   . Shortness of breath   . Acute respiratory failure (HCC) 10/15/2015  . Ascites 10/15/2015  . Osteoporosis   . Bipolar I disorder (HCC)   . Multiple sclerosis (HCC)   . Alcoholic cirrhosis of liver with ascites (HCC) 08/22/2015  . Bipolar disorder, current episode mixed, moderate (HCC) 08/01/2015  . Alcohol use disorder, severe, dependence (HCC) 07/31/2015  . Macrocytic anemia- due to alcohol abuse with normal B12 & folate levels 05/31/2015  . Severe protein-calorie malnutrition (HCC) 05/31/2015  . Hypokalemia 05/26/2015  . Encephalopathy, hepatic (HCC) 05/26/2015  . Anxiety 05/18/2015  . Sepsis (HCC) 05/18/2015  . UTI (lower urinary tract infection) 05/18/2015  . Stimulant abuse 12/27/2014  . Nicotine abuse 12/27/2014  . Hyperprolactinemia (HCC) 11/15/2014  . Dyslipidemia   . Noncompliance with therapeutic plan 04/04/2013  . Unspecified hereditary and idiopathic peripheral neuropathy 05/01/2012  . GERD (gastroesophageal reflux disease) 05/01/2012  . Osteoarthrosis, unspecified whether generalized or localized, involving lower leg 05/01/2012    Past Surgical History:  Procedure Laterality Date  . CESAREAN SECTION  [id number]  . MYRINGOTOMY WITH TUBE PLACEMENT Bilateral   . TONSILLECTOMY      OB History    No data available       Home Medications    Prior to Admission medications   Medication Sig Start Date End Date  Taking? Authorizing Provider  albuterol (PROVENTIL HFA;VENTOLIN HFA) 108 (90 Base) MCG/ACT inhaler Inhale 2 puffs into the lungs every 6 (six) hours as needed for wheezing or shortness of breath.    Historical Provider, MD  amantadine (SYMMETREL) 100 MG capsule Take 1 capsule (100 mg total) by mouth 2 (two) times daily. Patient not taking: Reported on 12/26/2015 08/04/15   Oneta Rack, NP  baclofen (LIORESAL) 10 MG tablet Take 1  tablet (10 mg total) by mouth 3 (three) times daily as needed for muscle spasms. Patient not taking: Reported on 12/26/2015 10/24/15   Garth Bigness, MD  chlordiazePOXIDE (LIBRIUM) 25 MG capsule 50mg  PO TID x 1D, then 25-50mg  PO BID X 1D, then 25-50mg  PO QD X 1D 12/26/15   Gerhard Munch, MD  feeding supplement, ENSURE ENLIVE, (ENSURE ENLIVE) LIQD Take 237 mLs by mouth 2 (two) times daily between meals. Patient not taking: Reported on 12/26/2015 10/16/15   Garth Bigness, MD  feeding supplement, ENSURE ENLIVE, (ENSURE ENLIVE) LIQD Take 237 mLs by mouth daily. Patient not taking: Reported on 12/26/2015 10/25/15   Garth Bigness, MD  FLUoxetine (PROZAC) 20 MG capsule Take 20 mg by mouth daily.     Historical Provider, MD  Fluticasone-Salmeterol (ADVAIR) 250-50 MCG/DOSE AEPB Inhale 1 puff into the lungs 2 (two) times daily.    Historical Provider, MD  folic acid (FOLVITE) 1 MG tablet Take 1 tablet (1 mg total) by mouth daily. 08/30/15   Barnetta Chapel, MD  furosemide (LASIX) 40 MG tablet Take 1 tablet (40 mg total) by mouth daily. Patient not taking: Reported on 12/26/2015 08/30/15   Barnetta Chapel, MD  gabapentin (NEURONTIN) 100 MG capsule Take 2 capsules (200 mg total) by mouth 3 (three) times daily. 01/07/16   [name], MD  hydrOXYzine (ATARAX/VISTARIL) 10 MG tablet Take 1 tablet (10 mg total) by mouth 3 (three) times daily as needed. 12/30/15   Alyssa A Kennon Rounds, MD  lactulose (CHRONULAC) 10 GM/15ML solution Take 30 mLs (20 g total) by mouth 2 (two) times daily. Patient not taking: Reported on 12/26/2015 08/30/15   Barnetta Chapel, MD  Melatonin 3 MG TABS Take 1 tablet (3 mg total) by mouth at bedtime as needed (sleep). 10/24/15   Garth Bigness, MD  meloxicam (MOBIC) 7.5 MG tablet Take 1 tablet (7.5 mg total) by mouth daily. 12/30/15   Alyssa A Haney, MD  miconazole (MICOTIN) 100 MG vaginal suppository Place 1 suppository (100 mg total) vaginally at bedtime. Patient  not taking: Reported on 12/26/2015 10/25/15   Garth Bigness, MD  Multiple Vitamin (MULTIVITAMIN WITH MINERALS) TABS tablet Take 1 tablet by mouth daily. Patient not taking: Reported on 12/26/2015 08/30/15   Barnetta Chapel, MD  nicotine (NICODERM CQ - DOSED IN MG/24 HOURS) 21 mg/24hr patch Place 1 patch (21 mg total) onto the skin daily. Patient not taking: Reported on 12/18/2015 08/04/15   Oneta Rack, NP  ondansetron (ZOFRAN ODT) 4 MG disintegrating tablet 4mg  ODT q4 hours prn nausea/vomit Patient not taking: Reported on 12/26/2015 12/12/15   Blane Ohara, MD  ondansetron (ZOFRAN) 4 MG tablet Take 1 tablet (4 mg total) by mouth every 8 (eight) hours as needed for nausea or vomiting. 01/22/16   Carney Living, MD  pantoprazole (PROTONIX) 40 MG tablet Take 40 mg by mouth daily.    Historical Provider, MD  pantoprazole (PROTONIX) 40 MG tablet Take 1 tablet (40 mg total) by mouth daily. 12/30/15   Bonney Aid, MD  polyethylene glycol (MIRALAX / GLYCOLAX) packet Take 17 g by mouth daily as needed for mild constipation. Patient not taking: Reported on 12/26/2015 10/16/15   Garth Bigness, MD  potassium chloride 20 MEQ TBCR Take 40 mEq by mouth daily. 10/16/15   Garth Bigness, MD  rifaximin (XIFAXAN) 550 MG TABS tablet Take 1 tablet (550 mg total) by mouth 2 (two) times daily. Patient not taking: Reported on 12/26/2015 06/05/15   Catarina Hartshorn, MD  risperiDONE (RISPERDAL) 0.5 MG tablet Take 1 tablet (0.5 mg total) by mouth at bedtime. 08/04/15   Oneta Rack, NP  spironolactone (ALDACTONE) 100 MG tablet Take 1 tablet (100 mg total) by mouth daily. Patient not taking: Reported on 12/18/2015 08/30/15   Barnetta Chapel, MD  topiramate (TOPAMAX) 100 MG tablet Take 1 tablet (100 mg total) by mouth daily. 01/07/16   [name], MD    Family History Family History  Problem Relation Age of Onset  . Heart disease Mother   . Heart disease Father   . Hypertension Father      Social History Social History  Substance Use Topics  . Smoking status: Current Every Day Smoker    Packs/day: 0.75    Years: 20.00    Types: Cigarettes  . Smokeless tobacco: Never Used     Comment: is cutting back, not in the car, house  . Alcohol use 4.8 oz/week    2 Glasses of wine, 2 Cans of beer, 4 Standard drinks or equivalent per week     Comment: 10/2015   " I drink occasionaly "     Allergies   Patient has no known allergies.   Review of Systems Review of Systems  Constitutional: Negative for fever.  Gastrointestinal: Positive for abdominal pain, anorexia, nausea and vomiting. Negative for constipation and diarrhea.  Genitourinary: Negative for dysuria.  All other systems reviewed and are negative.    Physical Exam Updated Vital Signs BP 106/77   Pulse 102   Temp 99.6 F (37.6 C) (Rectal)   Resp 17   SpO2 100%   Physical Exam  Constitutional: She is oriented to person, place, and time. She appears well-developed and well-nourished. No distress.  HENT:  Head: Normocephalic and atraumatic.  Mouth/Throat: Mucous membranes are dry.  Eyes: Conjunctivae and EOM are normal. Pupils are equal, round, and reactive to light.  Neck: Normal range of motion. Neck supple.  Cardiovascular: Regular rhythm and intact distal pulses.  Tachycardia present.   No murmur heard. Pulmonary/Chest: Effort normal and breath sounds normal. No respiratory distress. She has no wheezes. She has no rales.  Abdominal: Soft. She exhibits ascites. She exhibits no distension. There is tenderness. There is no rebound and no guarding.  Mild diffuse tenderness.  No peritoneal signs.  Easily reducible umbilical hernia  Musculoskeletal: Normal range of motion. She exhibits no edema or tenderness.  Neurological: She is alert and oriented to person, place, and time.  Skin: Skin is warm and dry. No rash noted. No erythema.  Psychiatric: She has a normal mood and affect. Her behavior is normal.   Nursing note and vitals reviewed.    ED Treatments / Results  Labs (all labs ordered are listed, but only abnormal results are displayed) Labs Reviewed  COMPREHENSIVE METABOLIC PANEL - Abnormal; Notable for the following:       Result Value   Sodium 131 (*)    Potassium <2.0 (*)    Chloride 82 (*)    Glucose, Bld 128 (*)  BUN <5 (*)    Calcium 8.3 (*)    Albumin 2.9 (*)    AST 50 (*)    Alkaline Phosphatase 186 (*)    Total Bilirubin 3.6 (*)    Anion gap 20 (*)    All other components within normal limits  CBC - Abnormal; Notable for the following:    Hemoglobin 11.7 (*)    RDW 16.7 (*)    All other components within normal limits  AMMONIA - Abnormal; Notable for the following:    Ammonia 84 (*)    All other components within normal limits  MAGNESIUM - Abnormal; Notable for the following:    Magnesium 1.5 (*)    All other components within normal limits  I-STAT CG4 LACTIC ACID, ED - Abnormal; Notable for the following:    Lactic Acid, Venous 1.96 (*)    All other components within normal limits  LIPASE, BLOOD  ETHANOL  URINALYSIS, ROUTINE W REFLEX MICROSCOPIC  I-STAT TROPOININ, ED    ED ECG REPORT   Date: 01/23/2016  Rate: 147  Rhythm: atrial flutter  QRS Axis: normal  Intervals: QT prolonged  ST/T Wave abnormalities: nonspecific ST changes  Conduction Disutrbances:none  Narrative Interpretation:   Old EKG Reviewed: changes noted  I have personally reviewed the EKG tracing and agree with the computerized printout as noted.    Radiology Dg Chest Port 1 View  Result Date: 01/22/2016 CLINICAL DATA:  55 y/o  F; shortness of breath. EXAM: PORTABLE CHEST 1 VIEW COMPARISON:  12/18/2015 chest radiograph FINDINGS: Stable cardiac silhouette within normal limits. Prominent epicardial fat pad. No focal consolidation. No pleural effusion. Bones are unremarkable. IMPRESSION: No active disease. Electronically Signed   By: Mitzi HansenLance  Furusawa-Stratton M.D.   On:  01/22/2016 23:48    Procedures Procedures (including critical care time)  Medications Ordered in ED Medications  potassium chloride 30 mEq in sodium chloride 0.9 % 265 mL (KCL MULTIRUN) IVPB (30 mEq Intravenous Given 01/22/16 2233)  magnesium sulfate IVPB 2 g 50 mL (2 g Intravenous Transfusing/Transfer 01/22/16 2335)  0.9 %  sodium chloride infusion ( Intravenous Transfusing/Transfer 01/22/16 2335)  0.9 %  sodium chloride infusion (not administered)  ondansetron (ZOFRAN) injection 4 mg (4 mg Intravenous Given 01/22/16 2119)  sodium chloride 0.9 % bolus 1,000 mL (0 mLs Intravenous Stopped 01/22/16 2219)  metoprolol (LOPRESSOR) injection 2.5 mg (2.5 mg Intravenous Given 01/22/16 2219)     Initial Impression / Assessment and Plan / ED Course  I have reviewed the triage vital signs and the nursing notes.  Pertinent labs & imaging results that were available during my care of the patient were reviewed by me and considered in my medical decision making (see chart for details).  Clinical Course    Patient is a 55 year old female with multiple medical problems presenting today with decreased by mouth intake, increased alcohol intake and mild diffuse abdominal pain. Patient was found to be in atrial flutter with a rate of 150 with stable blood pressure. Patient is afebrile with no signs of infection. Lab is consistent with hypokalemia, hypomagnesemia, dehydration but stable creatinine. White cell count is within normal limits. Low suspicion for SBP. EKG after conversion with sinus rhythm. She required 2.5 and metoprolol. Patient replaced with potassium and magnesium. Will admit for further care.  [facility] 2  CRITICAL CARE Performed by: Gwyneth SproutPLUNKETT,Elpidia Karn Total critical care time: 30 minutes Critical care time was exclusive of separately billable procedures and treating other patients. Critical care was necessary to treat or  prevent imminent or life-threatening deterioration. Critical care was  time spent personally by me on the following activities: development of treatment plan with patient and/or surrogate as well as nursing, discussions with consultants, evaluation of patient's response to treatment, examination of patient, obtaining history from patient or surrogate, ordering and performing treatments and interventions, ordering and review of laboratory studies, ordering and review of radiographic studies, pulse oximetry and re-evaluation of patient's condition.  CHA2DS2/VAS Stroke Risk Points      2 >= 2 Points: High Risk  1 - 1.99 Points: Medium Risk  0 Points: Low Risk    The patient's score has not changed in the past year.:  No Change         Details    Note: External data might be a factor in metrics not marked with    Points Metrics   This score determines the patient's risk of having a stroke if the  patient has atrial fibrillation.       0 Has Congestive Heart Failure:  No   0 Has Vascular Disease:  No   1 Has Hypertension:  Yes   0 Age:  30   0 Has Diabetes:  No   0 Had Stroke:  No Had TIA:  No Had thromboembolism:  No   1 Female:  Yes         Final Clinical Impressions(s) / ED Diagnoses   Final diagnoses:  Typical atrial flutter (HCC)  Hypokalemia  Hypomagnesemia  Alcohol abuse  Hypoxia    New Prescriptions Current Discharge Medication List       Gwyneth Sprout, MD 01/23/16 [phone]

## 2016-01-22 NOTE — ED Triage Notes (Addendum)
Pt c/o abdominal and low back pain. Also reports NV and decreased appetite x 3 days. Pt was seen by PCP today after pt's mother took her for a follow up. Per pt's chart, pt is a chronic alcoholic and has been drinking alcohol for several days, has not been eating. Also has ascites and liver failure per pt

## 2016-01-23 ENCOUNTER — Telehealth: Payer: Self-pay | Admitting: Licensed Clinical Social Worker

## 2016-01-23 ENCOUNTER — Inpatient Hospital Stay (HOSPITAL_COMMUNITY): Payer: Managed Care, Other (non HMO)

## 2016-01-23 ENCOUNTER — Encounter (HOSPITAL_COMMUNITY): Payer: Self-pay | Admitting: *Deleted

## 2016-01-23 DIAGNOSIS — R0902 Hypoxemia: Secondary | ICD-10-CM

## 2016-01-23 LAB — COMPREHENSIVE METABOLIC PANEL
ALBUMIN: 2.5 g/dL — AB (ref 3.5–5.0)
ALT: 16 U/L (ref 14–54)
AST: 43 U/L — AB (ref 15–41)
Alkaline Phosphatase: 164 U/L — ABNORMAL HIGH (ref 38–126)
Anion gap: 9 (ref 5–15)
CHLORIDE: 90 mmol/L — AB (ref 101–111)
CO2: 35 mmol/L — AB (ref 22–32)
Calcium: 7.7 mg/dL — ABNORMAL LOW (ref 8.9–10.3)
Creatinine, Ser: 0.61 mg/dL (ref 0.44–1.00)
GFR calc Af Amer: >60 mL/min (ref >60)
Glucose, Bld: 102 mg/dL — ABNORMAL HIGH (ref 65–99)
POTASSIUM: 2.4 mmol/L — AB (ref 3.5–5.1)
SODIUM: 134 mmol/L — AB (ref 135–145)
Total Bilirubin: 3.6 mg/dL — ABNORMAL HIGH (ref 0.3–1.2)
Total Protein: 5.7 g/dL — ABNORMAL LOW (ref 6.5–8.1)

## 2016-01-23 LAB — BASIC METABOLIC PANEL
ANION GAP: 12 (ref 5–15)
BUN: <5 mg/dL — ABNORMAL LOW (ref 6–20)
CHLORIDE: 89 mmol/L — AB (ref 101–111)
CO2: 33 mmol/L — AB (ref 22–32)
CREATININE: 0.6 mg/dL (ref 0.44–1.00)
Calcium: 7.6 mg/dL — ABNORMAL LOW (ref 8.9–10.3)
GFR calc non Af Amer: >60 mL/min (ref >60)
Glucose, Bld: 110 mg/dL — ABNORMAL HIGH (ref 65–99)
Potassium: 2.2 mmol/L — CL (ref 3.5–5.1)
SODIUM: 134 mmol/L — AB (ref 135–145)

## 2016-01-23 LAB — URINALYSIS, ROUTINE W REFLEX MICROSCOPIC
Glucose, UA: NEGATIVE mg/dL
Hgb urine dipstick: NEGATIVE
KETONES UR: 80 mg/dL — AB
Nitrite: POSITIVE — AB
PH: 7 (ref 5.0–8.0)
Protein, ur: 100 mg/dL — AB
Specific Gravity, Urine: 1.016 (ref 1.005–1.030)

## 2016-01-23 LAB — CK: CK TOTAL: 26 U/L — AB (ref 38–234)

## 2016-01-23 LAB — CBC
HEMATOCRIT: 32.4 % — AB (ref 36.0–46.0)
HEMOGLOBIN: 10.4 g/dL — AB (ref 12.0–15.0)
MCH: 29.6 pg (ref 26.0–34.0)
MCHC: 32.1 g/dL (ref 30.0–36.0)
MCV: 92.3 fL (ref 78.0–100.0)
Platelets: 191 10*3/uL (ref 150–400)
RBC: 3.51 MIL/uL — AB (ref 3.87–5.11)
RDW: 17 % — AB (ref 11.5–15.5)
WBC: 6.7 10*3/uL (ref 4.0–10.5)

## 2016-01-23 LAB — LACTIC ACID, PLASMA: Lactic Acid, Venous: 1.1 mmol/L (ref 0.5–1.9)

## 2016-01-23 LAB — LACTATE DEHYDROGENASE: LDH: 228 U/L — AB (ref 98–192)

## 2016-01-23 LAB — PROTIME-INR
INR: 1.47
Prothrombin Time: 18 seconds — ABNORMAL HIGH (ref 11.4–15.2)

## 2016-01-23 LAB — MRSA PCR SCREENING: MRSA by PCR: NEGATIVE

## 2016-01-23 LAB — MAGNESIUM: MAGNESIUM: 2 mg/dL (ref 1.7–2.4)

## 2016-01-23 LAB — POTASSIUM: POTASSIUM: 2.3 mmol/L — AB (ref 3.5–5.1)

## 2016-01-23 MED ORDER — FLUOXETINE HCL 20 MG PO CAPS: 20.0000 mg | ORAL_CAPSULE | Freq: Every day | ORAL | Status: DC

## 2016-01-23 MED ORDER — GABAPENTIN 400 MG PO CAPS
400.0000 mg | ORAL_CAPSULE | Freq: Three times a day (TID) | ORAL | Status: DC
Start: 2016-01-23 — End: 2016-01-24
  Administered 2016-01-24: 400 mg via ORAL
  Filled 2016-01-23 (×3): qty 1

## 2016-01-23 MED ORDER — LORAZEPAM 0.5 MG PO TABS
0.5000 mg | ORAL_TABLET | Freq: Four times a day (QID) | ORAL | Status: DC | PRN
  Administered 2016-01-24 – 2016-01-25 (×3): 0.5 mg via ORAL
  Filled 2016-01-23 (×3): qty 1

## 2016-01-23 MED ORDER — SODIUM CHLORIDE 0.9 % IV SOLN
30.0000 meq | Freq: Once | INTRAVENOUS | Status: AC
  Administered 2016-01-23: 30 meq via INTRAVENOUS
  Filled 2016-01-23: qty 15

## 2016-01-23 MED ORDER — LORAZEPAM 2 MG/ML IJ SOLN
0.5000 mg | Freq: Four times a day (QID) | INTRAMUSCULAR | Status: DC | PRN
  Administered 2016-01-23 – 2016-01-25 (×2): 0.5 mg via INTRAVENOUS
  Filled 2016-01-23 (×2): qty 1

## 2016-01-23 MED ORDER — RIFAXIMIN 200 MG PO TABS
200.0000 mg | ORAL_TABLET | Freq: Two times a day (BID) | ORAL | Status: DC
  Filled 2016-01-23: qty 1

## 2016-01-23 MED ORDER — SODIUM CHLORIDE 0.9 % IV SOLN
30.0000 meq | Freq: Once | INTRAVENOUS | Status: DC
  Filled 2016-01-23: qty 15

## 2016-01-23 MED ORDER — LIDOCAINE HCL (PF) 1 % IJ SOLN
INTRAMUSCULAR | Status: AC
  Filled 2016-01-23: qty 10

## 2016-01-23 MED ORDER — RIFAXIMIN 550 MG PO TABS
550.0000 mg | ORAL_TABLET | Freq: Two times a day (BID) | ORAL | Status: DC
  Administered 2016-01-24 – 2016-02-06 (×26): 550 mg via ORAL
  Filled 2016-01-23 (×29): qty 1

## 2016-01-23 MED ORDER — SODIUM CHLORIDE 0.9 % IV SOLN
30.0000 meq | INTRAVENOUS | Status: AC
  Administered 2016-01-24 (×2): 30 meq via INTRAVENOUS
  Filled 2016-01-23 (×2): qty 15

## 2016-01-23 MED ORDER — LACTULOSE 10 GM/15ML PO SOLN
30.0000 g | Freq: Two times a day (BID) | ORAL | Status: DC
  Administered 2016-01-24: 30 g via ORAL
  Filled 2016-01-23 (×3): qty 45

## 2016-01-23 MED ORDER — SODIUM CHLORIDE 0.9 % IV SOLN
30.0000 meq | INTRAVENOUS | Status: DC
  Filled 2016-01-23 (×2): qty 15

## 2016-01-23 MED ORDER — MECLIZINE HCL 25 MG PO TABS
25.0000 mg | ORAL_TABLET | Freq: Three times a day (TID) | ORAL | Status: DC | PRN
Start: 2016-01-23 — End: 2016-01-24

## 2016-01-23 MED ORDER — MAGNESIUM SULFATE 2 GM/50ML IV SOLN
2.0000 g | Freq: Once | INTRAVENOUS | Status: AC
  Administered 2016-01-23: 2 g via INTRAVENOUS
  Filled 2016-01-23: qty 50

## 2016-01-23 MED ORDER — SPIRONOLACTONE 25 MG PO TABS
100.0000 mg | ORAL_TABLET | Freq: Every day | ORAL | Status: DC
  Administered 2016-01-24: 100 mg via ORAL
  Filled 2016-01-23: qty 4

## 2016-01-23 MED ORDER — SODIUM CHLORIDE 0.9 % IV SOLN
30.0000 meq | INTRAVENOUS | Status: DC
  Administered 2016-01-23: 30 meq via INTRAVENOUS
  Filled 2016-01-23 (×3): qty 15

## 2016-01-23 MED ORDER — SODIUM CHLORIDE 0.9 % IV SOLN
INTRAVENOUS | Status: AC
  Administered 2016-01-23 (×2): via INTRAVENOUS

## 2016-01-23 MED ORDER — RISPERIDONE 0.5 MG PO TABS
0.5000 mg | ORAL_TABLET | Freq: Every day | ORAL | Status: DC
  Administered 2016-01-24 – 2016-02-05 (×12): 0.5 mg via ORAL
  Filled 2016-01-23 (×14): qty 1

## 2016-01-23 MED ORDER — SODIUM CHLORIDE 0.9 % IV SOLN
INTRAVENOUS | Status: DC
  Administered 2016-01-23: 17:00:00 via INTRAVENOUS
  Administered 2016-01-24: 1 mL via INTRAVENOUS

## 2016-01-23 NOTE — Progress Notes (Signed)
Called inpatient social worker Osborne Cascoadia for coordination of care and discharge planning.   Plan: This LCSW is available to assist inpatient LCSW as needed  Anne Hineseborah Bob Daversa, LCSW Licensed Clinical Social Worker [facility]   [phone] 8:58 AM

## 2016-01-23 NOTE — Progress Notes (Signed)
I &O done & obtained 150 cc dark colored urine output & it stopped & pt.was tense & refused to have it done .Bladder scan done again & obtained 500 cc.Dr Myrtie SomanWarden made aware.Will continue to monitor pt.

## 2016-01-23 NOTE — Progress Notes (Signed)
CRITICAL VALUE ALERT  Critical value received:  K+ 2.4  Date of notification:  01/23/16  Time of notification:  0910  Critical value read back: Yes  Nurse who received alert:  [facility]  MD notified (1st page):  Hensel  Time of first page:  Verbal notification [phone]  MD notified (2nd page):  Time of second page:  Responding MD:   Time MD responded:

## 2016-01-23 NOTE — Progress Notes (Signed)
Family Medicine Teaching Service Daily Progress Note Intern Pager: [phone]  Patient name: Anne Black Medical record number: [id number] Date of birth: 1960/04/07 Age: 55 y.o. Gender: female  Primary Care Provider: [name], MD Consultants: None Code Status: Full Code  Pt Overview and Major Events to Date:  1. Admit to FMTS  Assessment and Plan: Anne Black is a 55 y.o. female presenting with dehydration, generalized pain, ascites. PMH is significant for Alcoholic Liver Cirrhosis with Ascites, Alcohol Abuse, Bipolar DO, anxiety, GERD.   Dehydration, improving:  S/p 1L bolus in ED and 12 hours of NS @125cc /hr.  This morning she still appears dry, with dry MM, but appears to be improving.  - continue NS @ 125cc/hr while NPO and until paracentesis  Alcoholic Cirrhosis with Ascites: Diagnosed in 05/2015 with negative hepatitis panel and autoimmune labs at that time. No history of SBP. Patient does report of generalized abdominal pain/discomfort, but possibly due to distention instead of SBP in the setting of a normal white blood cell count and no fevers. Ammonia is more elevated than prior values at 84; no obvious signs of hepatic encephalopathy like asterixis.  Does have psychomotor slowing and slow speech, unclear of her baseline.  Has reportedly not been taking any medication for at least 2 weeks, including Lactulose, Rifaximin Lasix or Spironolactone. Does not follow up with GI.   MELD score 18, Childs-pugh score of 12.  - PT/INR mildly elevated at 18/1/47. Holding PPX currently, can add SCDs if negative LE duplex - consider coverage for SBP with 2g ceftriaxone - diagnostic/therapeutic paracentesis today - restart lactulose and spironolactone and rifaximin - stool guaiac - consider establishing care with GI in [place] prior to discharge   Hypokalemia: less than 2 in the ED, repeat 2.4 after IV K x2. Can be due to patient having possible emesis at home along with poor  PO intake - reordered IV K x 1  - repeat K+ this afternoon  Hypomagnesemia: 1.5 s/p repletion in ED.  Repeat 2.0, s/p mg repletion.  - continue to follow - daily 2g mag replacement - repeat mg in AM  Lactic Acidosis, Mild: Possibly due to dehydration. Clinical picture is not consistent with sepsis as a cause of her lactic acidosis. Possibly contributed by her liver disease.  Improved with fluids.  - IVF as above - repeat lactic acid level  Decreased Air Movement/Shortness of breath: Patient endorses feeling short of breath for uncertain period of time. Normal work of breathing and on room air, but decreased air movement on lung exam with distant crackles  - repeat CXR as appropriate  Generalized Pain: Reports of pain in back and lower extremities on palpation. CK to 24.  - continue to monitor.   Mild Hypotension, resolved: Likely due to dehydration. Resolved with IVF - monitor   Alcohol Abuse: Last drink yesterday morning. Scored 8 this morning.  - CIWA protocol - change PRN dosing to 0.5mg  given possible worsening of encephalopathy  MS: Followed by [facility]. Currently not on medications   Bipolar Disorder and Anxiety:  - Prozac 20mg  daily, Neurontin 400mg  TID, Risperdal 0.5mg , and Topamax on med list but uncertain if patient is taking or should be taking. Recently discharged from [facility] (01/08/16) with the following mediction : Prozac 20mg  daily, Gabapentin 400mg  TID, and Risperdal 0.5mg  at bedtime.  - will order Gabapentin 400mg  TID, and Risperdal 0.5mg  at bedtime. Hold Prozac for now due to QT prolongation (below)  History of Prolonged  QTC: QTc 532 - avoid QT prolonging medications  - repeat EKG in AM   FEN/GI: NPO, IVF NS @ 125 x 12 hours  Prophylaxis: holding pharmacologic anticoagulation until PT-INR returns   Disposition: discharge pending medical improvement  Subjective:  Feels about the same as yesterday.  Uncomfortable,  denies SOB, CP or significant abdominal pain.    Objective: Temp:  [97.5 F (36.4 C)-99.6 F (37.6 C)] 98 F (36.7 C) (12/15 0612) Pulse Rate:  [85-163] 96 (12/15 0612) Resp:  [16-24] 18 (12/15 0612) BP: (92-136)/(46-84) 127/67 (12/15 0612) SpO2:  [92 %-100 %] 96 % (12/15 0612) Weight:  [152 lb (68.9 kg)] 152 lb (68.9 kg) (12/14 1610) Physical Exam: General: Ill-appearing 55 year old female lying in bed in NAD Eyes: EOMI, PERRL, non-injected ENTM: MMM Neck: supple, no LAD Cardiovascular: Tachycardic, regular rhytym, intact distal pulses Respiratory: NWOB, poor air movement, faint bibasilar crackles, no wheezing or rhonchi apparent Gastrointestinal: marked ascities, soft, moderate tenderness, no peritoneal signs and no guarding, +BS MSK: no deformities or swelling, thoracic tenderness from T10/T12 to lumbar spine, no paraspinal tenderness, able to flex trunk for exam Derm: warm and dry Neuro: AAOx3, sensation intact, strength 5/5 upper and lower extremities Psych: Normal mood and affect, although has psychomotor slowing and delayed speech  Laboratory:  Recent Labs Lab 01/22/16 2047 01/23/16 0132  WBC 8.5 6.7  HGB 11.7* 10.4*  HCT 36.2 32.4*  PLT 230 191    Recent Labs Lab 01/22/16 2047 01/23/16 0132  NA 131* 134*  K <2.0* 2.2*  CL 82* 89*  CO2 29 33*  BUN <5* <5*  CREATININE 0.76 0.60  CALCIUM 8.3* 7.6*  PROT 6.8  --   BILITOT 3.6*  --   ALKPHOS 186*  --   ALT 20  --   AST 50*  --   GLUCOSE 128* 110*     Imaging/Diagnostic Tests: Dg Chest Port 1 View  Result Date: 01/22/2016 CLINICAL DATA:  55 y/o  F; shortness of breath. EXAM: PORTABLE CHEST 1 VIEW COMPARISON:  12/18/2015 chest radiograph FINDINGS: Stable cardiac silhouette within normal limits. Prominent epicardial fat pad. No focal consolidation. No pleural effusion. Bones are unremarkable. IMPRESSION: No active disease. Electronically Signed   By: Mitzi HansenLance  Furusawa-Stratton M.D.   On: 01/22/2016 23:48     Renne Muscaaniel L Warden, MD 01/23/2016, 8:32 AM PGY-1, [facility] FPTS Intern pager: [phone], text pages welcome

## 2016-01-23 NOTE — Progress Notes (Signed)
CRITICAL VALUE ALERT  Critical value received:  K+ 2.3  Date of notification:  01/23/16  Time of notification:  1613  Critical value read back: Yes  Nurse who received alert:  [facility]  MD notified (1st page):  Resident with Dr. Leveda Anna  Time of first page:  1615  MD notified (2nd page):  Time of second page:  Responding MD:    Time MD responded:

## 2016-01-23 NOTE — Progress Notes (Signed)
Paged Family medicine to make aware patient c/o of pain while urinating and  Noted pt had urinalysis done but not on any abx. Team is aware and plans to look at this after sign out.

## 2016-01-23 NOTE — Progress Notes (Signed)
Called pharmacy and asked to retime potassium.  Spoke to [place]. Retimed to begin at 2100 and every three hours after X 2 more.

## 2016-01-23 NOTE — Progress Notes (Signed)
Noted pt.has not voided & she wants to void but not able to do .Bladder scan done & obtained 700cc.Notified MD on call & ordered to do I&O.

## 2016-01-23 NOTE — Progress Notes (Signed)
No safe window was able to be found in any of her 4 quadrants to proceed with paracentesis.  Therefore, no procedure was performed.  The patient was taken back upstairs.  Melvin Marmo E

## 2016-01-23 NOTE — Progress Notes (Signed)
Admitted pt.to room 5w 17,from ED bec.of nausea & no po intake other than alcohol.She is alert ,oriented x 2,no resp.distress noted.Skin with abrasion on left elbow & some echymosis on rt.knee & left buttocks.IV infusing on left AC & saline lock on left hand,no redness noted.V/S taken & recorded & placed pt.on heart monitor.& made comfortable in bed.Abdomen is distended & firm.& with bilat.LE edema & slight redness on both legs.Will cont.to monitor pt.

## 2016-01-23 NOTE — Progress Notes (Signed)
CRITICAL VALUE ALERT  Critical value received:  K+=2.2  Date of notification: 01/23/16  Time of notification:  0215  Critical value read back:Yes.    Nurse who received alert:  Randell Loop RN  MD notified (1st page):  Dr.Warden  Time of first page:  0215  MD notified (2nd page):  Time of second page:  Responding MD:  Dr.Warden  Time MD responded:  [phone]

## 2016-01-23 NOTE — Discharge Summary (Signed)
b Family Medicine Teaching [facility] Discharge Summary  Patient name: Anne Black Medical record number: [id number] Date of birth: 10-13-1960 Age: 55 y.o. Gender: female Date of Admission: 01/22/2016  Date of Discharge: 01/07/2016 Admitting Physician: Moses Manners, MD  Primary Care Provider: [name], MD Consultants: Psychiatry, palliative care  Indication for Hospitalization: Dehydration, ascites  Discharge Diagnoses/Problem List:  Alcohol cirrhosis with ascites Volume depletion Bipolar disorder and anxiety UTI  Disposition: Discharged to [facility]  Discharge Condition: Stable, improved  Discharge Exam:  General: 55 year old female lying in bed in NAD Cardiovascular: RRR, intact distal pulses Respiratory: no increased work of breathing noted, [place] in place, NWOB Gastrointestinal:Soft and moderately distended with marked ascities Derm: warm and dry Neuro: Alert and Oriented x3 Psych: Speech is still slow but improving, mood appropriate  Brief Hospital Course:  55 year old woman with history of alcoholic liver cirrhosis with ascites, and alcohol abuse presenting with generalized abdominal pain with ascites and volume depletion. At the time of admission patient had no obvious signs of hepatic encephalopathy.  She had not been taking her lactulose rifaximin, Lasix or spironolactone for at least the past couple of weeks. She does not follow with a gastroenterologist. She was initially hyperkalemic less than 2 in the ED likely due to emesis and poor by mouth intake and by the time of discharge this had resolved after IV and PO repletion. She was noted to have a abnormal UA and had a urine culture that grew Proteus. She completed a course of Bactrim DS twice a day for 3 days. She had continued abdominal distention and had a paracentesis that yielded 2 L of clear fluid that grew less than 250 PMNs.  She was started on Bactrim DS 1 tab daily for SBP prophylaxis. Her home  lactulose and rifaximin were continued.  CIWA scores were followed and were as high as 30 was requiring scheduled and PRN Ativan for a number of days.  Due to her prolonged course of suspected alcohol withdrawal, psychiatry was consulted. They felt this was likely delirium and was important to rule out Wernicke encephalopathy and we started thiamine 500 mg IV 3 times a day for 3 days followed by 100 mg by mouth daily. Also recommended starting risperidone 0.5 mg 3 times a day when necessary for agitation and also to continue her risperidone 0.5 mg daily at bedtime. On day 13 of admission CIWA scoring and scheduled ativan were discontinued.  She was seen by physical therapy and occupational therapy who both recommended SNF placement.  Patient has a prognosis of < 6 months given advanced liver disease:  INR = 1.5, serum albumin 2.1, refractory ascites and palliative care was consulted.  She was noted to be confused and emotionally labile during palliative meeting, and recommendation was to complete a healthcare power of attorney and living will. This morning per mother and family patient is completely confused today and unable to make decisions regarding HCPOA or Living Will. Plan for palliative care team to follow at SNF upon discharge.  At the time of discharge patient was medically stable for SNF transfer.  Plan is to continue rehabilitation at SNF and ultimately transferred to inpatient rehabilitation for alcohol abuse treatment.     Issues for Follow Up:  1. General deconditioning: Discharged to SNF 2. Delirium: s/p 3 days IV thiamine, and then 100 mg thiamine daily for concern for Wernicke encephalopathy. Continue risperidone 0.5 mg daily at bedtime and 0.5 mg risperidone 3 times a day when necessary  for agitation.  3. Alcoholic cirrhosis: Prognosis of less than 6 months, patient needs to be adherent to lactulose, rifaximin, spironolactone, Lasix and to continue Bactrim daily for SBP prophylaxis. Also  needs to follow-up with a gastroenterologist and obviously abstain from alcohol.  Significant Procedures: Paracentesis  Significant Labs and Imaging:   Recent Labs Lab 02/03/16 0444 02/05/16 0450 02/06/16 0650  WBC 6.6 6.9 6.4  HGB 10.5* 10.1* 10.4*  HCT 33.0* 31.2* 31.9*  PLT 156 149* 139*    Recent Labs Lab 02/01/16 0416 02/03/16 0444 02/04/16 0440 02/04/16 1600 02/05/16 0450 02/06/16 0650  NA 135 135 135  --  131* 132*  K 3.7 3.4* 3.4*  --  3.3* 4.0  CL 103 101 99*  --  99* 102  CO2 27 28 24   --  27 25  GLUCOSE 100* 103* 98  --  100* 104*  BUN <5* <5* <5*  --  <5* <5*  CREATININE 0.49 0.45 0.55  --  0.48 0.51  CALCIUM 7.9* 8.3* 8.2*  --  7.7* 8.1*  MG  --   --  1.6* 1.7  --   --   ALKPHOS  --   --  138*  --   --   --   AST  --   --  46*  --   --   --   ALT  --   --  17  --   --   --   ALBUMIN  --   --  2.1*  --   --   --     Results/Tests Pending at Time of Discharge: None  Discharge Medications:  Allergies as of 02/06/2016   No Known Allergies     Medication List    STOP taking these medications   chlordiazePOXIDE 25 MG capsule Commonly known as:  LIBRIUM   hydrOXYzine 10 MG tablet Commonly known as:  ATARAX/VISTARIL   meloxicam 7.5 MG tablet Commonly known as:  MOBIC     TAKE these medications   albuterol 108 (90 Base) MCG/ACT inhaler Commonly known as:  PROVENTIL HFA;VENTOLIN HFA Inhale 2 puffs into the lungs every 6 (six) hours as needed for wheezing or shortness of breath.   amantadine 100 MG capsule Commonly known as:  SYMMETREL Take 1 capsule (100 mg total) by mouth 2 (two) times daily.   baclofen 10 MG tablet Commonly known as:  LIORESAL Take 1 tablet (10 mg total) by mouth 3 (three) times daily as needed for muscle spasms.   feeding supplement (ENSURE ENLIVE) Liqd Take 237 mLs by mouth 2 (two) times daily between meals. What changed:  Another medication with the same name was removed. Continue taking this medication, and follow  the directions you see here.   folic acid 1 MG tablet Commonly known as:  FOLVITE Take 1 tablet (1 mg total) by mouth daily.   furosemide 40 MG tablet Commonly known as:  LASIX Take 1 tablet (40 mg total) by mouth daily.   gabapentin 100 MG capsule Commonly known as:  NEURONTIN Take 2 capsules (200 mg total) by mouth 3 (three) times daily.   lactulose 10 GM/15ML solution Commonly known as:  CHRONULAC Take 45 mLs (30 g total) by mouth 3 (three) times daily. What changed:  how much to take  when to take this   Melatonin 3 MG Tabs Take 1 tablet (3 mg total) by mouth at bedtime as needed (sleep).   multivitamin with minerals Tabs tablet Take 1 tablet by mouth  daily.   nicotine 21 mg/24hr patch Commonly known as:  NICODERM CQ - dosed in mg/24 hours Place 1 patch (21 mg total) onto the skin daily. What changed:  Another medication with the same name was added. Make sure you understand how and when to take each.   nicotine 21 mg/24hr patch Commonly known as:  NICODERM CQ - dosed in mg/24 hours Place 1 patch (21 mg total) onto the skin daily. Start taking on:  02/07/2016 What changed:  You were already taking a medication with the same name, and this prescription was added. Make sure you understand how and when to take each.   ondansetron 4 MG disintegrating tablet Commonly known as:  ZOFRAN ODT 4mg  ODT q4 hours prn nausea/vomit   pantoprazole 40 MG tablet Commonly known as:  PROTONIX Take 1 tablet (40 mg total) by mouth daily.   polyethylene glycol packet Commonly known as:  MIRALAX / GLYCOLAX Take 17 g by mouth daily as needed for mild constipation.   Potassium Chloride ER 20 MEQ Tbcr Take 40 mEq by mouth daily.   rifaximin 550 MG Tabs tablet Commonly known as:  XIFAXAN Take 1 tablet (550 mg total) by mouth 2 (two) times daily.   risperiDONE 0.5 MG tablet Commonly known as:  RISPERDAL Take 1 tablet (0.5 mg total) by mouth at bedtime. What changed:  Another  medication with the same name was added. Make sure you understand how and when to take each.   risperiDONE 0.5 MG tablet Commonly known as:  RISPERDAL Take 1 tablet (0.5 mg total) by mouth 3 (three) times daily as needed (agitation). What changed:  You were already taking a medication with the same name, and this prescription was added. Make sure you understand how and when to take each.   spironolactone 100 MG tablet Commonly known as:  ALDACTONE Take 1 tablet (100 mg total) by mouth daily.   sulfamethoxazole-trimethoprim 800-160 MG tablet Commonly known as:  BACTRIM DS,SEPTRA DS Take 1 tablet by mouth daily. Start taking on:  02/07/2016   topiramate 100 MG tablet Commonly known as:  TOPAMAX Take 1 tablet (100 mg total) by mouth daily.       Discharge Instructions: Please refer to Patient Instructions section of EMR for full details.  Patient was counseled important signs and symptoms that should prompt return to medical care, changes in medications, dietary instructions, activity restrictions, and follow up appointments.   Follow-Up Appointments:   Renne Muscaaniel L Danie Diehl, MD 02/06/2016, 11:40 AM PGY-1, [facility]

## 2016-01-23 NOTE — Progress Notes (Signed)
At the request of MD, CSW spoke with patient's mother, Ollen GrossMarjorie. Ollen GrossMarjorie would like for patient to discharge to [facility], where she has been before. CSW followed up with facility. Morrie Sheldonshley in admissions stated that patient must be completely ADL capable in order to go to their facility. She is allowed to use a walker, but cannot need any assistance with tasks like bathing or walking. If patient is able to walk, she can discharge to [facility] [phone]). CSW would need to fax dc summary ([phone]). Patient's mother can drive her there and it is considered a "walk in".   Patient has been to snf before but left AMA. Will await PT recommendations.   Osborne Cascoadia Auriah Hollings LCSWA [phone]

## 2016-01-24 ENCOUNTER — Encounter (HOSPITAL_COMMUNITY): Payer: Self-pay

## 2016-01-24 DIAGNOSIS — R1013 Epigastric pain: Secondary | ICD-10-CM

## 2016-01-24 LAB — BASIC METABOLIC PANEL
ANION GAP: 8 (ref 5–15)
BUN: <5 mg/dL — ABNORMAL LOW (ref 6–20)
CALCIUM: 7.7 mg/dL — AB (ref 8.9–10.3)
CO2: 29 mmol/L (ref 22–32)
Chloride: 100 mmol/L — ABNORMAL LOW (ref 101–111)
Creatinine, Ser: 0.53 mg/dL (ref 0.44–1.00)
GFR calc non Af Amer: >60 mL/min (ref >60)
GLUCOSE: 90 mg/dL (ref 65–99)
POTASSIUM: 3.5 mmol/L (ref 3.5–5.1)
Sodium: 137 mmol/L (ref 135–145)

## 2016-01-24 LAB — CBC
HCT: 31.3 % — ABNORMAL LOW (ref 36.0–46.0)
HEMATOCRIT: 30.3 % — AB (ref 36.0–46.0)
HEMOGLOBIN: 9.5 g/dL — AB (ref 12.0–15.0)
HEMOGLOBIN: 9.7 g/dL — AB (ref 12.0–15.0)
MCH: 30.1 pg (ref 26.0–34.0)
MCH: 29.8 pg (ref 26.0–34.0)
MCHC: 31.4 g/dL (ref 30.0–36.0)
MCHC: 31 g/dL (ref 30.0–36.0)
MCV: 95.9 fL (ref 78.0–100.0)
MCV: 96 fL (ref 78.0–100.0)
PLATELETS: 167 10*3/uL (ref 150–400)
Platelets: 163 10*3/uL (ref 150–400)
RBC: 3.16 MIL/uL — AB (ref 3.87–5.11)
RBC: 3.26 MIL/uL — AB (ref 3.87–5.11)
RDW: 17.6 % — AB (ref 11.5–15.5)
RDW: 17.5 % — ABNORMAL HIGH (ref 11.5–15.5)
WBC: 6.9 10*3/uL (ref 4.0–10.5)
WBC: 7.3 10*3/uL (ref 4.0–10.5)

## 2016-01-24 LAB — OCCULT BLOOD X 1 CARD TO LAB, STOOL: Fecal Occult Bld: NEGATIVE

## 2016-01-24 LAB — MAGNESIUM: Magnesium: 2.3 mg/dL (ref 1.7–2.4)

## 2016-01-24 MED ORDER — POTASSIUM CHLORIDE CRYS ER 20 MEQ PO TBCR
30.0000 meq | EXTENDED_RELEASE_TABLET | Freq: Once | ORAL | Status: AC
  Administered 2016-01-24: 30 meq via ORAL
  Filled 2016-01-24: qty 1

## 2016-01-24 MED ORDER — SODIUM CHLORIDE 0.9 % IV SOLN
INTRAVENOUS | Status: DC
  Administered 2016-01-24: 11:00:00 via INTRAVENOUS

## 2016-01-24 MED ORDER — DEXTROSE-NACL 5-0.9 % IV SOLN
INTRAVENOUS | Status: DC
  Administered 2016-01-24 – 2016-01-25 (×3): via INTRAVENOUS
  Administered 2016-01-26: 125 mL via INTRAVENOUS
  Administered 2016-01-27 (×2): via INTRAVENOUS

## 2016-01-24 MED ORDER — LACTULOSE 10 GM/15ML PO SOLN
30.0000 g | Freq: Three times a day (TID) | ORAL | Status: DC
  Administered 2016-01-24 – 2016-01-25 (×5): 30 g via ORAL
  Filled 2016-01-24 (×5): qty 45

## 2016-01-24 MED ORDER — GABAPENTIN 100 MG PO CAPS
200.0000 mg | ORAL_CAPSULE | Freq: Three times a day (TID) | ORAL | Status: DC
  Administered 2016-01-24 – 2016-02-06 (×37): 200 mg via ORAL
  Filled 2016-01-24 (×39): qty 2

## 2016-01-24 MED ORDER — MAGNESIUM SULFATE 2 GM/50ML IV SOLN
2.0000 g | Freq: Once | INTRAVENOUS | Status: AC
  Administered 2016-01-24: 2 g via INTRAVENOUS
  Filled 2016-01-24: qty 50

## 2016-01-24 MED ORDER — PANTOPRAZOLE SODIUM 40 MG PO TBEC
40.0000 mg | DELAYED_RELEASE_TABLET | Freq: Two times a day (BID) | ORAL | Status: DC
  Administered 2016-01-24 – 2016-02-06 (×26): 40 mg via ORAL
  Filled 2016-01-24 (×27): qty 1

## 2016-01-24 NOTE — Progress Notes (Signed)
PT Cancellation Note  Patient Details Name: Anne Black MRN: [id number] DOB: 1960-05-29   Cancelled Treatment:    Reason Eval/Treat Not Completed: Patient declined, no reason specified;Other (comment) (Come back tomorrow).  Notified nursing of the issue.   Ivar Drape 01/24/2016, 2:40 PM   Samul Dada, PT MS Acute Rehab Dept. Number: [facility] [id number] and [facility] [phone]

## 2016-01-24 NOTE — Clinical Social Work Note (Signed)
Clinical Social Work Assessment  Patient Details  Name: Anne Black MRN: [id number] Date of Birth: April 23, 1960  Date of referral:  01/24/16               Reason for consult:  Discharge Planning                Permission sought to share information with:  Family Supports Permission granted to share information::  Yes, Verbal Permission Granted  Name::     Kendell Bane  Agency::     Relationship::  mother  Contact Information:  [phone]  Housing/Transportation Living arrangements for the past 2 months:  [place] of Information:  Patient, Parent Patient Interpreter Needed:  None Criminal Activity/Legal Involvement Pertinent to Current Situation/Hospitalization:  No - Comment as needed Significant Relationships:  Adult Children, Parents Lives with:  Minor Children Do you feel safe going back to the place where you live?  Yes Need for family participation in patient care:  No (Coment)  Care giving concerns:  Patient's daughters and mother are supportive   Social Worker assessment / plan:  Holiday representative met patient at bedside to offer support and discuss patients needs at discharge. Patient is agreeable to SNF placement and would prefer [place] area. CSW also spoke to patient mother Ms. Hatley. Ms. Ardine Bjork states that she wants her daughter to stay in the hospital until patient completely detox from alcohol use. CSW asked physician to order PT/OT for patient to see if she is appropriate for SNF placement. CSW made patient's mother aware that patient would have to be able to walk before SNF can offer a bed. CSW awaiting notes from PT/OT. CSW remains available for support and to facilitate patient discharge once medically stable.  Employment status:  Unemployed Forensic scientist:  Other (Comment Required) Scientist, clinical (histocompatibility and immunogenetics)) PT Recommendations:    Information / Referral to community resources:  [place]  Patient/Family's Response to care:   Patient verbalized appreciation and understanding for CSW role and involvement in care. Patient agreeable with current discharge plan to SNF following discharge.   Patient/Family's Understanding of and Emotional Response to Diagnosis, Current Treatment, and Prognosis:  Patient with good understating of current medical state and limitations around most recent hospitalization. Patient agreeable with SNF placement.  Emotional Assessment Appearance:  Appears older than stated age Attitude/Demeanor/Rapport:  Sedated Affect (typically observed):  Calm Orientation:    Alcohol / Substance use:  Alcohol Use Psych involvement (Current and /or in the community):  Outpatient Provider  Discharge Needs  Concerns to be addressed:  No discharge needs identified Readmission within the last 30 days:  No Current discharge risk:  None, Substance Abuse Barriers to Discharge:  Active Substance Use   Rhea Pink, MSW,  [place]

## 2016-01-24 NOTE — Progress Notes (Signed)
Family Medicine Teaching Service Daily Progress Note Intern Pager: [phone]  Patient name: Anne Black Medical record number: [id number] Date of birth: 22-Jan-1961 Age: 55 y.o. Gender: female  Primary Care Provider: Palma HolterKanishka G Gunadasa, MD Consultants: None Code Status: Full Code  Pt Overview and Major Events to Date:  1. Admit to FMTS  Assessment and Plan: Anne ApplebaumCarla S Pitera is a 55 y.o. female presenting with dehydration, generalized pain, ascites. PMH is significant for Alcoholic Liver Cirrhosis with Ascites, Alcohol Abuse, Bipolar DO, anxiety, GERD.   Dehydration, ongoing: Poor PO intake. Dry MM - cont on NS @125cc /hr - cont to encourage   Alcoholic Cirrhosis with Ascites:  Does continue to have psychomotor slowing and slow speech, unclear of her baseline, although mom feels that she is not herself.  Refused lactulose yesterday, but did take a dose this morning.  Continuing spironolactone and rifaxamin. Paracentesis not performed yesterday due to no safe window visible in any of her 4 quadrants to proceed with paracentesis.  Does not appear to be more distended and abd pain appears to be improved. Spoke with IR, Dr. Fredia SorrowYamagata who stated that she did not have a significant amount of ascities on US and if she worsens clinically that they could obtain a diagnostic sample if necessary.  - PT/INR mildly elevated at 18/1.47. Holding PPX currently, can add SCDs if negative LE duplex - consider coverage for SBP with 2g ceftriaxone - continue spironolactone and rifaximin - continue to encourage lactulose.  - stool guaiac - consider [place] care [place] in [place] prior to discharge   Melenic stool: History of alcoholic cirrhosis not followed by GI and current drinking. No documented history of prior bleeds. On protonix 40mg  daily, question compliance.  Unclear NSAID use. This morning nurse confirmed black, stool.  Hgb upon presentation was 11.7 and was down to 10.4 yesterday and 9.5 this  AM.   - rectal exam with hemoccult - consider GI c/s - high dose PPI to 40mg  BID - mid afternoon CBC - AM CBC  Hypokalemia, resolved: 3.5 this AM.  - cont to monitor - 30 kdur  Hypomagnesemia, resolved: 1.5 s/p repletion in ED.  2.3 this AM.  - continue to follow - daily 2g mag replacement x5 days (day 3) - repeat mg in AM  Lactic Acidosis, resolved: Possibly due to dehydration. Clinical picture is not consistent with sepsis as a cause of her lactic acidosis. Possibly contributed by her liver disease.  Improved with fluids.  - IVF as above - repeat lactic acid level  Decreased Air Movement/Shortness of breath, improved: Patient endorses feeling short of breath for uncertain period of time. Normal work of breathing and on room air, but decreased air movement on lung exam with distant crackles  - repeat CXR as appropriate  Generalized Pain, improving: Reports of pain in back and lower extremities on palpation. CK to 24.  - continue to monitor.   Mild Hypotension, resolved: Likely due to dehydration. Resolved with IVF - monitor   Alcohol Abuse: Last drink two days ago. Scored 6 early this morning.  - CIWA protocol - change PRN dosing to 0.5mg  given possible worsening of encephalopathy  MS: [facility]. Currently not on medications   Bipolar Disorder and Anxiety:  - Prozac 20mg  daily, Neurontin 400mg  TID, Risperdal 0.5mg , and Topamax on med list but uncertain if patient is taking or should be taking. Recently discharged from [facility] (01/08/16) with the following mediction : Prozac 20mg  daily, Gabapentin 400mg   TID, and Risperdal 0.5mg  at bedtime.  - will order Gabapentin 400mg  TID, and Risperdal 0.5mg  at bedtime. Hold Prozac for now due to QT prolongation (below)  History of Prolonged QTC: QTc 532 - avoid QT prolonging medications  - repeat EKG in AM   FEN/GI: regular diet Prophylaxis: holding pharmacologic anticoagulation until PT-INR  returns   Disposition: discharge pending medical improvement  Subjective:  Feels about the same as yesterday.  Uncomfortable, denies SOB, CP or significant abdominal pain.    Objective: Temp:  [97.4 F (36.3 C)-98.1 F (36.7 C)] 98 F (36.7 C) (12/16 0500) Pulse Rate:  [88-97] 94 (12/16 0500) Resp:  [18] 18 (12/16 0500) BP: (108-114)/(60-74) 108/60 (12/16 0500) SpO2:  [93 %-100 %] 100 % (12/16 0500) Weight:  [166 lb 11.2 oz (75.6 kg)] 166 lb 11.2 oz (75.6 kg) (12/16 0011) Physical Exam: General: Ill-appearing 55 year old female lying in bed in NAD Eyes: non-injected ENTM: dry MM Cardiovascular: RRR, intact distal pulses Respiratory: NWOB, poor air movement, faint bibasilar crackles, mild wheezing diffusely Gastrointestinal: marked ascities, soft, NT, no peritoneal signs and no guarding, +BS Derm: warm and dry Neuro: AAOx3 Psych: Normal mood and affect, although has psychomotor slowing and delayed speech  Laboratory:  Recent Labs Lab 01/22/16 2047 01/23/16 0132 01/24/16 0504  WBC 8.5 6.7 6.9  HGB 11.7* 10.4* 9.5*  HCT 36.2 32.4* 30.3*  PLT 230 191 163    Recent Labs Lab 01/22/16 2047 01/23/16 0132 01/23/16 0702 01/23/16 1520 01/24/16 0504  NA 131* 134* 134*  --  137  K <2.0* 2.2* 2.4* 2.3* 3.5  CL 82* 89* 90*  --  100*  CO2 29 33* 35*  --  29  BUN <5* <5* <5*  --  <5*  CREATININE 0.76 0.60 0.61  --  0.53  CALCIUM 8.3* 7.6* 7.7*  --  7.7*  PROT 6.8  --  5.7*  --   --   BILITOT 3.6*  --  3.6*  --   --   ALKPHOS 186*  --  164*  --   --   ALT 20  --  16  --   --   AST 50*  --  43*  --   --   GLUCOSE 128* 110* 102*  --  90     Imaging/Diagnostic Tests: Dg Abd 1 View  Result Date: 01/23/2016 CLINICAL DATA:  Hepatic cirrhosis.  Abdominal pain. EXAM: ABDOMEN - 1 VIEW COMPARISON:  None. FINDINGS: The bowel gas pattern is normal. No radio-opaque calculi or other significant radiographic abnormality are seen. IMPRESSION: No evidence of bowel obstruction or  ileus. Electronically Signed   By: Lupita Raider, M.D.   On: 01/23/2016 16:21   US Abdomen Limited  Result Date: 01/23/2016 CLINICAL DATA:  Patient with a history of cirrhosis and recurrent abdominal ascites. Request was made today for paracentesis. EXAM: LIMITED ABDOMEN ULTRASOUND FOR ASCITES TECHNIQUE: Limited ultrasound survey for ascites was performed in all four abdominal quadrants. COMPARISON:  None. FINDINGS: A minimal amount of ascites is noted throughout the abdomen. Due to bowel in close proximity to her peritoneum, there is no safe window in order to perform the paracentesis for fluid removal. IMPRESSION: Minimal ascites with unsafe window to perform paracentesis. Procedure not done. Read by: Barnetta Chapel, PA-C Electronically Signed   By: Gilmer Mor D.O.   On: 01/23/2016 12:31    Renne Musca, MD 01/24/2016, 9:21 AM PGY-1, [place] [facility] FPTS Intern pager: [phone], text pages welcome

## 2016-01-24 NOTE — [place] FL2 (Signed)
[place] MEDICAID FL2 LEVEL OF CARE SCREENING TOOL     IDENTIFICATION  Patient Name: Anne Black Birthdate: 27-Oct-1960 Sex: female Admission Date (Current Location): 01/22/2016  [facility] and [place] Number:  Producer, television/film/video and Address:  [facility] [place], [address] [place], [place] [address]      Provider Number: [id number]  Attending Physician Name and Address:  Moses Manners, MD  Relative Name and Phone Number:       Current Level of Care: Hospital Recommended Level of Care: Skilled Nursing Facility Prior Approval Number:    Date Approved/Denied:   PASRR Number:    Discharge Plan:      Current Diagnoses: Patient Active Problem List   Diagnosis Date Noted  . Hypomagnesemia   . Hypoxia   . Dehydration 01/22/2016  . Low back ache 12/30/2015  . Non-intractable vomiting with nausea   . Alcohol withdrawal (HCC) 12/15/2015  . Acute alcoholism (HCC)   . Alcohol abuse   . Shortness of breath   . Acute respiratory failure (HCC) 10/15/2015  . Ascites due to alcoholic cirrhosis (HCC) 10/15/2015  . Osteoporosis   . Bipolar I disorder (HCC)   . Multiple sclerosis (HCC)   . Alcoholic cirrhosis of liver with ascites (HCC) 08/22/2015  . Bipolar disorder, current episode mixed, moderate (HCC) 08/01/2015  . Alcohol use disorder, severe, dependence (HCC) 07/31/2015  . Macrocytic anemia- due to alcohol abuse with normal B12 & folate levels 05/31/2015  . Severe protein-calorie malnutrition (HCC) 05/31/2015  . Hypokalemia 05/26/2015  . Encephalopathy, hepatic (HCC) 05/26/2015  . Abdominal pain 05/18/2015  . Anxiety 05/18/2015  . Sepsis (HCC) 05/18/2015  . UTI (lower urinary tract infection) 05/18/2015  . Stimulant abuse 12/27/2014  . Nicotine abuse 12/27/2014  . Hyperprolactinemia (HCC) 11/15/2014  . Dyslipidemia   . Noncompliance with therapeutic plan 04/04/2013  . Unspecified hereditary and idiopathic peripheral neuropathy 05/01/2012   . GERD (gastroesophageal reflux disease) 05/01/2012  . Osteoarthrosis, unspecified whether generalized or localized, involving lower leg 05/01/2012    Orientation RESPIRATION BLADDER Height & Weight     Self, Place  Normal Incontinent Weight: 166 lb 11.2 oz (75.6 kg) Height:     BEHAVIORAL SYMPTOMS/MOOD NEUROLOGICAL BOWEL NUTRITION STATUS      Continent Diet (Regualr, thin fluid consistency )  AMBULATORY STATUS COMMUNICATION OF NEEDS Skin   Extensive Assist Verbally Normal                       Personal Care Assistance Level of Assistance  Bathing, Feeding, Dressing Bathing Assistance: Limited assistance Feeding assistance: Independent Dressing Assistance: Limited assistance     Functional Limitations Info  Sight, Hearing, Speech Sight Info: Adequate Hearing Info: Adequate Speech Info: Adequate    SPECIAL CARE FACTORS FREQUENCY  PT (By licensed PT), OT (By licensed OT)     PT Frequency: 5x OT Frequency: 5x            Contractures Contractures Info: Not present    Additional Factors Info  Code Status, Allergies Code Status Info: Full Allergies Info: No known allergies            Current Medications (01/24/2016):  This is the current hospital active medication list Current Facility-Administered Medications  Medication Dose Route Frequency Provider Last Rate Last Dose  . 0.9 %  sodium chloride infusion   Intravenous Continuous Renne Musca, MD 125 mL/hr at 01/24/16 1127    . dextrose 5 %-0.9 %  sodium chloride infusion   Intravenous Continuous Leighton Roachodd D McDiarmid, MD 125 mL/hr at 01/24/16 1553    . folic acid (FOLVITE) tablet 1 mg  1 mg Oral Daily Palma HolterKanishka G Gunadasa, MD   1 mg at 01/24/16 0917  . gabapentin (NEURONTIN) capsule 200 mg  200 mg Oral TID Leighton Roachodd D McDiarmid, MD   200 mg at 01/24/16 1559  . lactulose (CHRONULAC) 10 GM/15ML solution 30 g  30 g Oral TID Leighton Roachodd D McDiarmid, MD   30 g at 01/24/16 1601  . LORazepam (ATIVAN) tablet 0.5 mg  0.5 mg Oral  Q6H PRN Asiyah Mayra ReelZahra Mikell, MD   0.5 mg at 01/24/16 1559   Or  . LORazepam (ATIVAN) injection 0.5 mg  0.5 mg Intravenous Q6H PRN Asiyah Mayra ReelZahra Mikell, MD   0.5 mg at 01/23/16 1324  . multivitamin with minerals tablet 1 tablet  1 tablet Oral Daily Palma HolterKanishka G Gunadasa, MD   1 tablet at 01/24/16 [phone]  . pantoprazole (PROTONIX) EC tablet 40 mg  40 mg Oral BID Renne Muscaaniel L Warden, MD      . rifaximin Burman Blacksmith(XIFAXAN) tablet 550 mg  550 mg Oral BID Moses MannersWilliam A Hensel, MD   550 mg at 01/24/16 0917  . risperiDONE (RISPERDAL) tablet 0.5 mg  0.5 mg Oral QHS Palma HolterKanishka G Gunadasa, MD      . thiamine (VITAMIN B-1) tablet 100 mg  100 mg Oral Daily Palma HolterKanishka G Gunadasa, MD   100 mg at 01/24/16 [id number]     Discharge Medications: Please see discharge summary for a list of discharge medications.  Relevant Imaging Results:  Relevant Lab Results:   Additional Information ss#[id number]  Jonathon JordanLynn B Anwyn Kriegel, LCSWA

## 2016-01-25 ENCOUNTER — Inpatient Hospital Stay (HOSPITAL_COMMUNITY): Payer: Managed Care, Other (non HMO)

## 2016-01-25 DIAGNOSIS — R52 Pain, unspecified: Secondary | ICD-10-CM

## 2016-01-25 DIAGNOSIS — F1010 Alcohol abuse, uncomplicated: Secondary | ICD-10-CM

## 2016-01-25 LAB — CBC
HCT: 35.1 % — ABNORMAL LOW (ref 36.0–46.0)
HEMATOCRIT: 32.3 % — AB (ref 36.0–46.0)
HEMOGLOBIN: 10.1 g/dL — AB (ref 12.0–15.0)
Hemoglobin: 10.7 g/dL — ABNORMAL LOW (ref 12.0–15.0)
MCH: 30 pg (ref 26.0–34.0)
MCH: 29.8 pg (ref 26.0–34.0)
MCHC: 30.5 g/dL (ref 30.0–36.0)
MCHC: 31.3 g/dL (ref 30.0–36.0)
MCV: 98.3 fL (ref 78.0–100.0)
MCV: 95.3 fL (ref 78.0–100.0)
PLATELETS: 147 10*3/uL — AB (ref 150–400)
Platelets: 138 10*3/uL — ABNORMAL LOW (ref 150–400)
RBC: 3.57 MIL/uL — ABNORMAL LOW (ref 3.87–5.11)
RBC: 3.39 MIL/uL — AB (ref 3.87–5.11)
RDW: 18.3 % — AB (ref 11.5–15.5)
RDW: 17.6 % — ABNORMAL HIGH (ref 11.5–15.5)
WBC: 6.7 10*3/uL (ref 4.0–10.5)
WBC: 6.7 10*3/uL (ref 4.0–10.5)

## 2016-01-25 LAB — MAGNESIUM: Magnesium: 2.2 mg/dL (ref 1.7–2.4)

## 2016-01-25 LAB — COMPREHENSIVE METABOLIC PANEL
ALBUMIN: 2.5 g/dL — AB (ref 3.5–5.0)
ALT: 17 U/L (ref 14–54)
ANION GAP: 5 (ref 5–15)
AST: 41 U/L (ref 15–41)
Alkaline Phosphatase: 141 U/L — ABNORMAL HIGH (ref 38–126)
BILIRUBIN TOTAL: 1.9 mg/dL — AB (ref 0.3–1.2)
BUN: <5 mg/dL — ABNORMAL LOW (ref 6–20)
CO2: 28 mmol/L (ref 22–32)
Calcium: 8.2 mg/dL — ABNORMAL LOW (ref 8.9–10.3)
Chloride: 105 mmol/L (ref 101–111)
Creatinine, Ser: 0.42 mg/dL — ABNORMAL LOW (ref 0.44–1.00)
Glucose, Bld: 133 mg/dL — ABNORMAL HIGH (ref 65–99)
POTASSIUM: 2.8 mmol/L — AB (ref 3.5–5.1)
Sodium: 138 mmol/L (ref 135–145)
TOTAL PROTEIN: 5.9 g/dL — AB (ref 6.5–8.1)

## 2016-01-25 LAB — BASIC METABOLIC PANEL WITH GFR
Anion gap: 9 (ref 5–15)
BUN: <5 mg/dL — ABNORMAL LOW (ref 6–20)
CO2: 24 mmol/L (ref 22–32)
Calcium: 8.1 mg/dL — ABNORMAL LOW (ref 8.9–10.3)
Chloride: 104 mmol/L (ref 101–111)
Creatinine, Ser: 0.46 mg/dL (ref 0.44–1.00)
GFR calc Af Amer: >60 mL/min
GFR calc non Af Amer: >60 mL/min
Glucose, Bld: 152 mg/dL — ABNORMAL HIGH (ref 65–99)
Potassium: 3.7 mmol/L (ref 3.5–5.1)
Sodium: 137 mmol/L (ref 135–145)

## 2016-01-25 LAB — LACTATE DEHYDROGENASE: LDH: 288 U/L — ABNORMAL HIGH (ref 98–192)

## 2016-01-25 MED ORDER — LORAZEPAM 0.5 MG PO TABS
0.5000 mg | ORAL_TABLET | ORAL | Status: DC | PRN
  Administered 2016-01-25 – 2016-01-27 (×6): 0.5 mg via ORAL
  Filled 2016-01-25 (×7): qty 1

## 2016-01-25 MED ORDER — LORAZEPAM 2 MG/ML IJ SOLN
0.5000 mg | INTRAMUSCULAR | Status: DC
  Administered 2016-01-25: 0.5 mg via INTRAVENOUS

## 2016-01-25 MED ORDER — IPRATROPIUM-ALBUTEROL 0.5-2.5 (3) MG/3ML IN SOLN: 3.0000 mL | Freq: Once | RESPIRATORY_TRACT | Status: DC

## 2016-01-25 MED ORDER — LORAZEPAM 2 MG/ML IJ SOLN
0.5000 mg | INTRAMUSCULAR | Status: DC | PRN
  Administered 2016-01-26 (×2): 0.5 mg via INTRAVENOUS
  Filled 2016-01-25 (×4): qty 1

## 2016-01-25 MED ORDER — ENOXAPARIN SODIUM 40 MG/0.4ML ~~LOC~~ SOLN
40.0000 mg | SUBCUTANEOUS | Status: DC
  Administered 2016-01-25 – 2016-02-06 (×13): 40 mg via SUBCUTANEOUS
  Filled 2016-01-25 (×12): qty 0.4

## 2016-01-25 MED ORDER — IPRATROPIUM-ALBUTEROL 0.5-2.5 (3) MG/3ML IN SOLN
3.0000 mL | RESPIRATORY_TRACT | Status: DC | PRN
  Administered 2016-01-25 – 2016-01-29 (×6): 3 mL via RESPIRATORY_TRACT
  Filled 2016-01-25 (×7): qty 3

## 2016-01-25 MED ORDER — ENOXAPARIN SODIUM 30 MG/0.3ML ~~LOC~~ SOLN
30.0000 mg | SUBCUTANEOUS | Status: DC
Start: 2016-01-25 — End: 2016-01-25

## 2016-01-25 MED ORDER — SPIRONOLACTONE 25 MG PO TABS
100.0000 mg | ORAL_TABLET | Freq: Every day | ORAL | Status: DC
  Administered 2016-01-26 – 2016-02-06 (×12): 100 mg via ORAL
  Filled 2016-01-25 (×2): qty 2
  Filled 2016-01-25 (×2): qty 4
  Filled 2016-01-25 (×8): qty 2

## 2016-01-25 NOTE — Progress Notes (Signed)
VASCULAR LAB PRELIMINARY  PRELIMINARY  PRELIMINARY  PRELIMINARY  Bilateral lower extremity venous duplex completed.    Preliminary report:  There is no DVT or SVT noted in the visualized veins of the bilateral lower extremities.   Ryver Zadrozny, RVT 01/25/2016, 12:34 PM

## 2016-01-25 NOTE — Progress Notes (Signed)
Went to evaluate Mrs. Anne Black following nursing reports of increased wheezing, worsening abdominal distension, and irritation. Increased wheezing noted on exam. Has previously been improved with duonebs--will give duoneb at this time.  Abdominal exam with worsening tightness and distention since exam this morning. Decreased bowel sounds noted compared to this morning as well. Will order Paracentesis with US and KUB.   CIWA score of 13 noted, Ativan given with improvement of irritation. Mrs. Anne Black sleepy but easily awakens with stimulation and oriented x3 on exam. Has been taking Lactulose as prescribed. Suspect she is continuing to Safeco Corporationwithdraw--continue CIWA protocol.  Will continue to monitor closely.  Dr. Caroleen Hammanumley 01/25/16, 3:53 PM

## 2016-01-25 NOTE — Progress Notes (Signed)
Family Medicine Teaching Service Daily Progress Note Intern Pager: [phone]  Patient name: Anne ApplebaumCarla S Black Medical record number: [id number] Date of birth: 01/08/1961 Age: 55 y.o. Gender: female  Primary Care Provider: Palma HolterKanishka G Gunadasa, MD Consultants: None Code Status: Full Code  Pt Overview and Major Events to Date:  1. Admit to FMTS  Assessment and Plan: 55 y.o. female presenting with dehydration, generalized pain, ascites. PMH is significant for Alcoholic Liver Cirrhosis with Ascites, Alcohol Abuse, Bipolar DO, anxiety, GERD.   Dehydration, ongoing: Poor PO intake. Dry MM. Creatinine stable. - D5 1/2 NS @125cc /hr - Continue to encourage PO intake  Alcoholic Cirrhosis with Ascites:  - Monitor CIWA (8>4>0>0). Ativan 0.5mg  given liver function. - Covering for SBP with 2g ceftriaxone. Unable to obtain Paracentesis. - Continue spironolactone and rifaximin - Continue to encourage lactulose.  - Thiamine and Folic Acid - PT/OT - Social Work for disposition planning - Consider establishing care with GI in [place] prior to discharge  UTI: Urinalysis with few bacteria, small bilirubin, moderate leukocytes, positive nitrites. Urine Culture with Proteus mirabilis.  - Follow up sensitivities - Continue Ceftriaxone  Melenic stool: Black stool reported by nursing. CBC 11.7>10.4>9.7>10.7. FOBT negative. - High dose PPI to 40mg  twice daily - Monitor CBC  Bipolar Disorder and Anxiety: Prozac 20mg  daily, Neurontin 400mg  TID, Risperdal 0.5mg , and Topamax on med list but uncertain if patient is taking or should be taking. Recently discharged from [facility] (01/08/16) with the following mediction : Prozac 20mg  daily, Gabapentin 400mg  TID, and Risperdal 0.5mg  at bedtime.  - Gabapentin 400mg  TID, and Risperdal 0.5mg  at bedtime.  - Hold Prozac for now due to QT prolongation (below)  History of Prolonged QTC: QTc 532>518 - Avoid QT prolonging medications   FEN/GI:  regular diet Prophylaxis: Lovenox  Disposition: discharge pending medical improvement  Subjective:  Denies any acute concerns. Does note some suprapubic pain, consistent with UTI. Again states she would like to go to Rehab at discharge.   Objective: Temp:  [97.4 F (36.3 C)-98 F (36.7 C)] 97.4 F (36.3 C) (12/17 0606) Pulse Rate:  [95-100] 100 (12/17 0606) Resp:  [16-20] 20 (12/17 0606) BP: (82-125)/(45-77) 112/59 (12/17 0606) SpO2:  [96 %-100 %] 96 % (12/17 0606) Physical Exam: General: Ill-appearing 55 year old female lying in bed in NAD Eyes: non-injected ENTM: dry MM Cardiovascular: RRR, intact distal pulses Respiratory: NWOB, poor air movement, faint bibasilar crackles, mild wheezing diffusely, upper respiratory sounds noted Gastrointestinal: marked ascities, soft, NT, no peritoneal signs and no guarding, +BS Derm: warm and dry Neuro: AAOx3 Psych: Normal mood and affect, although has psychomotor slowing and delayed speech  Laboratory:  Recent Labs Lab 01/24/16 0504 01/24/16 1637 01/25/16 0853  WBC 6.9 7.3 6.7  HGB 9.5* 9.7* 10.7*  HCT 30.3* 31.3* 35.1*  PLT 163 167 147*    Recent Labs Lab 01/22/16 2047  01/23/16 0702 01/23/16 1520 01/24/16 0504 01/25/16 0533  NA 131*  < > 134*  --  137 137  K <2.0*  < > 2.4* 2.3* 3.5 3.7  CL 82*  < > 90*  --  100* 104  CO2 29  < > 35*  --  29 24  BUN <5*  < > <5*  --  <5* <5*  CREATININE 0.76  < > 0.61  --  0.53 0.46  CALCIUM 8.3*  < > 7.7*  --  7.7* 8.1*  PROT 6.8  --  5.7*  --   --   --   BILITOT 3.6*  --  3.6*  --   --   --   ALKPHOS 186*  --  164*  --   --   --   ALT 20  --  16  --   --   --   AST 50*  --  43*  --   --   --   GLUCOSE 128*  < > 102*  --  90 152*  < > = values in this interval not displayed. - LA 1.1 - LDH 228  Imaging/Diagnostic Tests: Dg Chest 2 View  Result Date: 01/25/2016 CLINICAL DATA:  Tachypnea.  Cough. EXAM: CHEST  2 VIEW COMPARISON:  Previous examinations, the most recent dated  01/22/2016. FINDINGS: The cardiac silhouette remains borderline enlarged. A prominent epicardial fat pad is again demonstrated with a small area of probable scarring in that region without significant change. Otherwise, the lungs are clear with mildly prominent interstitial markings. Diffuse osteopenia. IMPRESSION: No acute abnormality. Mild chronic interstitial lung disease and borderline cardiomegaly. Electronically Signed   By: Beckie Salts M.D.   On: 01/25/2016 08:40    Araceli Bouche, DO 01/25/2016, 9:34 AM PGY-3, [facility] FPTS Intern pager: [phone], text pages welcome

## 2016-01-25 NOTE — Evaluation (Signed)
Physical Therapy Evaluation Patient Details Name: Anne Black MRN: [id number] DOB: 04-14-1960 Today's Date: 01/25/2016   History of Present Illness  55 y.o. female presenting with dehydration, generalized pain, ascites. PMH is significant for Alcoholic Liver Cirrhosis with Ascites, Alcohol Abuse, Bipolar DO, anxiety, GERD  Clinical Impression  Patient seen for assessment. Patient will need continued skilled PT to address deficits and maximize function. Will see as indicated and progress as tolerated.Recommend SNF upon acute discharge. At this time, patient is extremely confused upon entering room.  Audible wheezing and patient attempting to climb out of bed but wrapped in lines. O2 saturations 86% on 2 liters upon arrival, increased O2 to 3 liters. Nsg aware.     Follow Up Recommendations SNF;Supervision/Assistance - 24 hour    Equipment Recommendations  None recommended by PT    Recommendations for Other Services       Precautions / Restrictions Precautions Precautions: Fall Restrictions Weight Bearing Restrictions: No      Mobility  Bed Mobility Overal bed mobility: Needs Assistance Bed Mobility: Rolling;Supine to Sit;Sit to Supine Rolling: Min assist   Supine to sit: Min assist Sit to supine: Min assist   General bed mobility comments: Min assist for safety  Transfers Overall transfer level: Needs assistance Equipment used: 1 person hand held assist Transfers: Sit to/from [employer] CorporationStand;Stand Pivot Transfers Sit to Stand: Min assist Stand pivot transfers: Mod assist       General transfer comment: min assist to power up and provide stability, moderate assist to [facility], chair, back to bed then back to chair again  Ambulation/Gait Ambulation/Gait assistance: Mod assist Ambulation Distance (Feet): 6 Feet Assistive device: 1 person hand held assist Gait Pattern/deviations: Shuffle;Decreased stride length;Narrow base of support Gait velocity: decreased Gait  velocity interpretation: Below normal speed for age/gender General Gait Details: patient very unsteady, difficulty mobilizing due to safety and confusion.  Stairs            Wheelchair Mobility    Modified Rankin (Stroke Patients Only)       Balance Overall balance assessment: Needs assistance   Sitting balance-Leahy Scale: Fair       Standing balance-Leahy Scale: Poor Standing balance comment: relies assist at this time for stability                             Pertinent Vitals/Pain Pain Assessment: Faces Faces Pain Scale: Hurts little more Pain Location: patient states her stomach and back hurt Pain Descriptors / Indicators: Moaning Pain Intervention(s): Monitored during session;Repositioned    Home Living Family/patient expects to be discharged to:: Private residence Living Arrangements: Alone Available Help at Discharge: Family;Available PRN/intermittently Type of Home: House Home Access: Stairs to enter Entrance Stairs-Rails: None Entrance Stairs-Number of Steps: 1 Home Layout: Able to live on main level with bedroom/bathroom;Multi-level Home Equipment: Walker - 2 wheels;Cane - single point;Wheelchair - manual Additional Comments: history taken from Chart as patient poor historian today and is very confused    Prior Function                 Hand Dominance   Dominant Hand: Right    Extremity/Trunk Assessment   Upper Extremity Assessment Upper Extremity Assessment: Generalized weakness    Lower Extremity Assessment Lower Extremity Assessment: Generalized weakness       Communication   Communication: No difficulties  Cognition Arousal/Alertness: Awake/alert Behavior During Therapy: Restless Overall Cognitive Status:  (extremely confused throughout session)  General Comments: extremely confused throughout session, asking to have "radiation take her to her car because she should not have come her from the  bus stop" When asked about her family, patient ialso extremel restless, attmepting to climb out of bed multiple times without waiting for assist.    General Comments      Exercises     Assessment/Plan    PT Assessment Patient needs continued PT services  PT Problem List Decreased strength;Decreased activity tolerance;Decreased balance;Decreased mobility;Decreased coordination;Decreased cognition;Decreased safety awareness;Cardiopulmonary status limiting activity;Pain          PT Treatment Interventions DME instruction;Gait training;Functional mobility training;Therapeutic activities;Therapeutic exercise;Balance training;Neuromuscular re-education;Cognitive remediation;Patient/family education    PT Goals (Current goals can be found in the Care Plan section)  Acute Rehab PT Goals Patient Stated Goal: to get out of here PT Goal Formulation: With patient Time For Goal Achievement: 02/08/16 Potential to Achieve Goals: Fair    Frequency Min 2X/week   Barriers to discharge        Co-evaluation               End of Session Equipment Utilized During Treatment: Oxygen Activity Tolerance: Other (comment) (limited by confusion and restlessness) Patient left: in chair;with call bell/phone within reach;with chair alarm set (at nursing station) Nurse Communication: Mobility status         Time: 1478-2956 PT Time Calculation (min) (ACUTE ONLY): 25 min   Charges:   PT Evaluation $PT Eval Moderate Complexity: 1 Procedure     PT G Codes:        Fabio Asa February 05, 2016, 10:52 AM Charlotte Crumb, PT DPT  [phone]

## 2016-01-25 NOTE — Progress Notes (Signed)
During change of shift report day shift nurse reported that patient was much more agitated today and she was having severe hallucinations. MD called and reported that a CIWA score had not been documented since 1200 noon and asked if this nurse would complete one. CIWA score at @ 2100 was 18. MD placed new orders to schedule IV ativan 0.5mg  Q 4 hours and to perform CIWA scoring Q 1 hour. MD also ordered patient to be transferred to a step down bed. Patient transferred to [address] [place] room 15. This nurse called the patient's primary contact which is her mother at [phone] and left her a message to call when she got the message.

## 2016-01-25 NOTE — Progress Notes (Addendum)
CIWA trend 13>18. Will schedule Ativan 0.5mg  q4 hours with an additional 0.5mg  available as needed q4hr per CIWA. Will increase CIWA frequency to every 1hours for 8 hours to monitor more closely. Transfer to Step Down Unit.  Dr. Caroleen Hamman 01/25/16, 9:43 PM   ADDENDUM:  Micah Flesher to evaluate Mrs. Lorella Nimrod. Now only oriented to person and place, not to time (August 1817). Asterixis noted. Tachycardia noted throughout the day 102-112. Dr. McDiarmid contacted and agrees with plan to SDU. Will consider increasing scheduled dose to 1mg  if no improvement over the next few hours. Note history of prolonged somnolence in past with 1mg  Ativan given liver function, but we may be required to use this dose or higher to control withdrawal symptoms. Will curbside ICU to alert about patient and plan to transfer to ICU if no improvement over the next several hours.

## 2016-01-26 ENCOUNTER — Inpatient Hospital Stay (HOSPITAL_COMMUNITY): Payer: Managed Care, Other (non HMO)

## 2016-01-26 DIAGNOSIS — I483 Typical atrial flutter: Secondary | ICD-10-CM

## 2016-01-26 DIAGNOSIS — R0682 Tachypnea, not elsewhere classified: Secondary | ICD-10-CM

## 2016-01-26 DIAGNOSIS — R188 Other ascites: Secondary | ICD-10-CM | POA: Diagnosis present

## 2016-01-26 DIAGNOSIS — K7011 Alcoholic hepatitis with ascites: Secondary | ICD-10-CM

## 2016-01-26 DIAGNOSIS — I4892 Unspecified atrial flutter: Secondary | ICD-10-CM

## 2016-01-26 LAB — GRAM STAIN

## 2016-01-26 LAB — BODY FLUID CELL COUNT WITH DIFFERENTIAL
Lymphs, Fluid: 45 %
Monocyte-Macrophage-Serous Fluid: 46 % — ABNORMAL LOW (ref 50–90)
Neutrophil Count, Fluid: 8 % (ref 0–25)
Total Nucleated Cell Count, Fluid: 86 uL (ref 0–1000)

## 2016-01-26 LAB — BASIC METABOLIC PANEL
ANION GAP: 7 (ref 5–15)
BUN: <5 mg/dL — ABNORMAL LOW (ref 6–20)
CALCIUM: 8 mg/dL — AB (ref 8.9–10.3)
CO2: 27 mmol/L (ref 22–32)
Chloride: 105 mmol/L (ref 101–111)
Creatinine, Ser: 0.39 mg/dL — ABNORMAL LOW (ref 0.44–1.00)
GLUCOSE: 111 mg/dL — AB (ref 65–99)
POTASSIUM: 3.6 mmol/L (ref 3.5–5.1)
SODIUM: 139 mmol/L (ref 135–145)

## 2016-01-26 LAB — CBC
HCT: 33 % — ABNORMAL LOW (ref 36.0–46.0)
Hemoglobin: 10.2 g/dL — ABNORMAL LOW (ref 12.0–15.0)
MCH: 29.8 pg (ref 26.0–34.0)
MCHC: 30.9 g/dL (ref 30.0–36.0)
MCV: 96.5 fL (ref 78.0–100.0)
PLATELETS: 129 10*3/uL — AB (ref 150–400)
RBC: 3.42 MIL/uL — AB (ref 3.87–5.11)
RDW: 18 % — AB (ref 11.5–15.5)
WBC: 5.9 10*3/uL (ref 4.0–10.5)

## 2016-01-26 LAB — ALBUMIN, FLUID (OTHER): Albumin, Fluid: <1 g/dL

## 2016-01-26 LAB — URINE CULTURE: Culture: >=100000 — AB

## 2016-01-26 LAB — LACTATE DEHYDROGENASE: LDH: 200 U/L — AB (ref 98–192)

## 2016-01-26 LAB — PROTEIN, BODY FLUID: Total protein, fluid: <3 g/dL

## 2016-01-26 MED ORDER — LACTULOSE 10 GM/15ML PO SOLN
30.0000 g | Freq: Four times a day (QID) | ORAL | Status: DC
  Administered 2016-01-26: 30 g via ORAL
  Filled 2016-01-26: qty 45

## 2016-01-26 MED ORDER — SODIUM CHLORIDE 0.9 % IV SOLN
30.0000 meq | Freq: Once | INTRAVENOUS | Status: AC
  Administered 2016-01-26: 30 meq via INTRAVENOUS
  Filled 2016-01-26: qty 15

## 2016-01-26 MED ORDER — DEXTROSE 5 % IV SOLN
2.0000 g | INTRAVENOUS | Status: DC
  Administered 2016-01-26: 2 g via INTRAVENOUS
  Filled 2016-01-26: qty 2

## 2016-01-26 MED ORDER — IPRATROPIUM-ALBUTEROL 0.5-2.5 (3) MG/3ML IN SOLN
3.0000 mL | Freq: Four times a day (QID) | RESPIRATORY_TRACT | Status: DC
  Administered 2016-01-26 – 2016-01-28 (×10): 3 mL via RESPIRATORY_TRACT
  Filled 2016-01-26 (×11): qty 3

## 2016-01-26 MED ORDER — LIDOCAINE HCL (CARDIAC) 20 MG/ML IV SOLN
INTRAVENOUS | Status: AC
  Filled 2016-01-26: qty 5

## 2016-01-26 MED ORDER — SULFAMETHOXAZOLE-TRIMETHOPRIM 800-160 MG PO TABS
1.0000 | ORAL_TABLET | Freq: Two times a day (BID) | ORAL | Status: DC
  Administered 2016-01-26 – 2016-01-28 (×5): 1 via ORAL
  Filled 2016-01-26 (×6): qty 1

## 2016-01-26 MED ORDER — LORAZEPAM 2 MG/ML IJ SOLN: 1.0000 mg | INTRAMUSCULAR | Status: DC

## 2016-01-26 MED ORDER — DEXTROSE 5 % IV SOLN: 1.0000 g | INTRAVENOUS | Status: DC

## 2016-01-26 MED ORDER — LORAZEPAM 2 MG/ML IJ SOLN: 0.5000 mg | INTRAMUSCULAR | Status: DC

## 2016-01-26 MED ORDER — DEXTROSE 5 % IV SOLN: 2.0000 g | INTRAVENOUS | Status: DC

## 2016-01-26 MED ORDER — SODIUM CHLORIDE 0.9 % IV BOLUS (SEPSIS)
1000.0000 mL | Freq: Once | INTRAVENOUS | Status: AC
  Administered 2016-01-26: 1000 mL via INTRAVENOUS

## 2016-01-26 MED ORDER — LACTULOSE 10 GM/15ML PO SOLN
30.0000 g | Freq: Three times a day (TID) | ORAL | Status: DC
  Administered 2016-01-26 – 2016-01-29 (×8): 30 g via ORAL
  Filled 2016-01-26 (×10): qty 45

## 2016-01-26 MED ORDER — NICOTINE 21 MG/24HR TD PT24
21.0000 mg | MEDICATED_PATCH | Freq: Every day | TRANSDERMAL | Status: DC
  Administered 2016-01-26 – 2016-02-06 (×12): 21 mg via TRANSDERMAL
  Filled 2016-01-26 (×12): qty 1

## 2016-01-26 NOTE — Procedures (Signed)
Successful US guided paracentesis from RLQ.  Yielded 2.7L of clear yellow fluid.  No immediate complications.  Pt tolerated well.   Specimen was sent for labs.  Brayton El PA-C 01/26/2016 10:26 AM

## 2016-01-26 NOTE — Clinical Social Work Note (Signed)
Patient only oriented to self. CSW called patient's mother and gave bed offers. Patient's mother is on her way to [place] on a trip that she has had planned for a while. Patient's mother was very appreciative of bed offers. She will review with patient's daughters, Anne Pinchess and Anne FiremanHayes, and have one of them call CSW. Patient's mother will return from her trip on Thursday night.   Charlynn CourtSarah Anelis Hrivnak, CSW [phone]

## 2016-01-26 NOTE — Progress Notes (Signed)
eLink Physician-Brief Progress Note Patient Name: Anne Black DOB: 1960-08-11 MRN: [id number]   Date of Service  01/26/2016  HPI/Events of Note  H/O Alcohol cirrhosis, hepatic encephalopathy, alcohol withdrawl  eICU Interventions  Stable on cam check, resting comfortably. Will continue to monitor for precedex needs.     Intervention Category Evaluation Type: New Patient Evaluation  Anne Black 01/26/2016, 12:16 AM

## 2016-01-26 NOTE — Care Management Important Message (Signed)
Important Message  Patient Details  Name: Ladon ApplebaumCarla S Peart MRN: [id number] Date of Birth: [birth year]   Medicare Important Message Given:  Yes    Kyla BalzarineShealy, Viridiana Spaid Abena 01/26/2016, 10:54 AM

## 2016-01-26 NOTE — Progress Notes (Signed)
Family Medicine Teaching Service Daily Progress Note Intern Pager: [phone]  Patient name: Anne Black: [id number] Date of birth: Oct 08, 1960 Age: 55 y.o. Gender: female  Primary Care Provider: Palma HolterKanishka G Gunadasa, MD Consultants: None Code Status: Full Code  Pt Overview and Major Events to Date:  1. Admit to FMTS  Assessment and Plan: 55 y.o. female presenting with dehydration, generalized pain, ascites. PMH is significant for Alcoholic Liver Cirrhosis with Ascites, Alcohol Abuse, Bipolar DO, anxiety, GERD.   Volume depletion, ongoing: Poor PO intake. Dry MM. Creatinine 0.39 - continue D5 1/2 NS @125cc /hr - 1L NS bolus - Continue to encourage PO intake  Alcoholic Cirrhosis with Ascites:  CIWA scoring to 18 last night and received ativan 1mg  q4hrs. Scoring 0 this AM.  Transferred to SDU.  Yesterday noted to have increased abd distension. Paracentesis yielded  2.7L of clear yellow fluid that showed <250 PMNs with low likelihood of SBP.  - discontinued ceftriaxone - started bactrim DS (will do 3 days of BID for UTI coverage and then 1 tab for prophylaxis coverage) - CIWA scoring changed to q4hrs - Continue spironolactone and rifaximin - continue with q8hrs lactulose - Thiamine and Folic Acid - PT/OT-- PT>>SNF and 24hr supervision  - Social Work for disposition planning - Consider [place] care with GI in [place] prior to discharge  UTI: Urinalysis with few bacteria, small bilirubin, moderate leukocytes, positive nitrites. Urine Culture with Proteus mirabilis sensitive to bactrim.  Continues to have dysuria.  - bactrim DS BID for 3 days  Melenic stool: Black stool reported by nursing. CBC 11.7>10.4>9.7>10.7>10.1 FOBT negative. - High dose PPI to 40mg  twice daily - Monitor CBC  Bipolar Disorder and Anxiety: Prozac 20mg  daily, Neurontin 400mg  TID, Risperdal 0.5mg , and Topamax on med list but uncertain if patient is taking or should be taking.  Recently discharged from [facility] (01/08/16) with the following mediction : Prozac 20mg  daily, Gabapentin 400mg  TID, and Risperdal 0.5mg  at bedtime.  - Gabapentin 400mg  TID, and Risperdal 0.5mg  at bedtime.  - Hold Prozac for now due to QT prolongation (below)  History of Prolonged QTC: QTc 532>518 - Avoid QT prolonging medications   FEN/GI: regular diet Prophylaxis: Lovenox  Disposition: discharge pending medical improvement  Subjective:  Continues to have dysuria, denies any CP, SOB, NVD.   Objective: Temp:  [97.9 F (36.6 C)-99 F (37.2 C)] 98.3 F (36.8 C) (12/18 0805) Pulse Rate:  [88-101] 94 (12/18 0805) Resp:  [17-25] 25 (12/18 0805) BP: (113-127)/(52-81) 118/81 (12/18 0805) SpO2:  [100 %] 100 % (12/18 0810) Weight:  [166 lb 10.7 oz (75.6 kg)] 166 lb 10.7 oz (75.6 kg) (12/18 0327) Physical Exam: General: Ill-appearing 55 year old female lying in bed in NAD Eyes: non-injected ENTM: dry MM Cardiovascular: RRR, intact distal pulses Respiratory: NWOB, poor air movement, faint bibasilar crackles, mild wheezing diffusely, upper respiratory sounds noted Gastrointestinal: marked ascities, soft, NT, no peritoneal signs and no guarding, +BS Derm: warm and dry Neuro: AAOx2 ([facility] mild asterixis Psych: Normal mood and affect, although has psychomotor slowing and delayed speech  Laboratory:  Recent Labs Lab 01/25/16 0853 01/25/16 2230 01/26/16 0516  WBC 6.7 6.7 5.9  HGB 10.7* 10.1* 10.2*  HCT 35.1* 32.3* 33.0*  PLT 147* 138* 129*    Recent Labs Lab 01/22/16 2047  01/23/16 0702  01/25/16 0533 01/25/16 2230 01/26/16 0516  NA 131*  < > 134*  < > 137 138 139  K <2.0*  < >  2.4*  < > 3.7 2.8* 3.6  CL 82*  < > 90*  < > 104 105 105  CO2 29  < > 35*  < > 24 28 27   BUN <5*  < > <5*  < > <5* <5* <5*  CREATININE 0.76  < > 0.61  < > 0.46 0.42* 0.39*  CALCIUM 8.3*  < > 7.7*  < > 8.1* 8.2* 8.0*  PROT 6.8  --  5.7*  --   --  5.9*   --   BILITOT 3.6*  --  3.6*  --   --  1.9*  --   ALKPHOS 186*  --  164*  --   --  141*  --   ALT 20  --  16  --   --  17  --   AST 50*  --  43*  --   --  41  --   GLUCOSE 128*  < > 102*  < > 152* 133* 111*  < > = values in this interval not displayed. - LA 1.1 - LDH 228  Imaging/Diagnostic Tests: Dg Abd Portable 1v  Result Date: 01/25/2016 CLINICAL DATA:  Abdominal distension decreased bowel sounds EXAM: PORTABLE ABDOMEN - 1 VIEW COMPARISON:  01/23/2016 FINDINGS: Scattered large and small bowel gas is noted. No obstructive changes are seen. No acute bony abnormality is noted. No mass lesion is seen. IMPRESSION: No acute abnormality noted. Electronically Signed   By: Alcide Clever M.D.   On: 01/25/2016 17:57    Renne Musca, MD 01/26/2016, 8:37 AM PGY-1, [facility] FPTS Intern pager: [phone], text pages welcome

## 2016-01-27 ENCOUNTER — Inpatient Hospital Stay (HOSPITAL_COMMUNITY): Payer: Managed Care, Other (non HMO)

## 2016-01-27 LAB — COMPREHENSIVE METABOLIC PANEL
ALBUMIN: 2.1 g/dL — AB (ref 3.5–5.0)
ALK PHOS: 138 U/L — AB (ref 38–126)
ALT: 17 U/L (ref 14–54)
AST: 39 U/L (ref 15–41)
Anion gap: 6 (ref 5–15)
BILIRUBIN TOTAL: 1.9 mg/dL — AB (ref 0.3–1.2)
CALCIUM: 7.9 mg/dL — AB (ref 8.9–10.3)
CO2: 28 mmol/L (ref 22–32)
Chloride: 105 mmol/L (ref 101–111)
Creatinine, Ser: 0.44 mg/dL (ref 0.44–1.00)
GFR calc Af Amer: >60 mL/min (ref >60)
GFR calc non Af Amer: >60 mL/min (ref >60)
GLUCOSE: 118 mg/dL — AB (ref 65–99)
Potassium: 2.9 mmol/L — ABNORMAL LOW (ref 3.5–5.1)
Sodium: 139 mmol/L (ref 135–145)
TOTAL PROTEIN: 5.1 g/dL — AB (ref 6.5–8.1)

## 2016-01-27 LAB — CBC
HEMATOCRIT: 32.4 % — AB (ref 36.0–46.0)
HEMOGLOBIN: 10.2 g/dL — AB (ref 12.0–15.0)
MCH: 29.9 pg (ref 26.0–34.0)
MCHC: 31.5 g/dL (ref 30.0–36.0)
MCV: 95 fL (ref 78.0–100.0)
Platelets: 122 10*3/uL — ABNORMAL LOW (ref 150–400)
RBC: 3.41 MIL/uL — ABNORMAL LOW (ref 3.87–5.11)
RDW: 18 % — ABNORMAL HIGH (ref 11.5–15.5)
WBC: 5.7 10*3/uL (ref 4.0–10.5)

## 2016-01-27 MED ORDER — LORAZEPAM 1 MG PO TABS: 1.0000 mg | ORAL_TABLET | ORAL | Status: DC | PRN

## 2016-01-27 MED ORDER — LORAZEPAM 2 MG/ML IJ SOLN
0.5000 mg | INTRAMUSCULAR | Status: DC
  Administered 2016-01-27: 1 mg via INTRAVENOUS
  Filled 2016-01-27: qty 1

## 2016-01-27 MED ORDER — LORAZEPAM 2 MG/ML IJ SOLN
2.0000 mg | INTRAMUSCULAR | Status: DC
  Administered 2016-01-27 – 2016-01-30 (×14): 2 mg via INTRAVENOUS
  Filled 2016-01-27 (×14): qty 1

## 2016-01-27 MED ORDER — LORAZEPAM 2 MG/ML IJ SOLN: 0.5000 mg | INTRAMUSCULAR | Status: DC

## 2016-01-27 MED ORDER — LORAZEPAM 2 MG/ML IJ SOLN
1.0000 mg | Freq: Once | INTRAMUSCULAR | Status: AC
  Administered 2016-01-27: 1 mg via INTRAVENOUS

## 2016-01-27 MED ORDER — SODIUM CHLORIDE 0.9 % IV BOLUS (SEPSIS)
1000.0000 mL | Freq: Once | INTRAVENOUS | Status: AC
Start: 2016-01-27 — End: 2016-01-27
  Administered 2016-01-27: 1000 mL via INTRAVENOUS

## 2016-01-27 MED ORDER — FUROSEMIDE 10 MG/ML IJ SOLN
40.0000 mg | Freq: Once | INTRAMUSCULAR | Status: AC
  Administered 2016-01-28: 40 mg via INTRAVENOUS
  Filled 2016-01-27: qty 4

## 2016-01-27 MED ORDER — LORAZEPAM 2 MG/ML IJ SOLN
0.5000 mg | INTRAMUSCULAR | Status: DC | PRN
  Administered 2016-01-28 – 2016-01-29 (×3): 0.5 mg via INTRAVENOUS
  Filled 2016-01-27 (×4): qty 1

## 2016-01-27 MED ORDER — POTASSIUM CHLORIDE CRYS ER 20 MEQ PO TBCR
40.0000 meq | EXTENDED_RELEASE_TABLET | Freq: Two times a day (BID) | ORAL | Status: DC
  Administered 2016-01-27 (×2): 40 meq via ORAL
  Filled 2016-01-27 (×3): qty 2

## 2016-01-27 MED ORDER — LORAZEPAM 2 MG/ML IJ SOLN: 0.5000 mg | INTRAMUSCULAR | Status: DC | PRN

## 2016-01-27 NOTE — Evaluation (Signed)
Occupational Therapy Evaluation Patient Details Name: Anne Black MRN: [id number] DOB: [birth year] Today's Date: 01/27/2016    History of Present Illness 55 y.o. female presenting with dehydration, generalized pain, ascites. PMH is significant for Alcoholic Liver Cirrhosis with Ascites, Alcohol Abuse, Bipolar DO, anxiety, GERD   Clinical Impression   Pt admitted with above. She demonstrates the below listed deficits and will benefit from continued OT to maximize safety and independence with BADLs.  Pt presents to OT with impaired cognition, generalized weakness, decreased activity tolerance, impaired balance, decreased FMC.  She require mod - total A for ADLs and mod A for functional transfers.  She will need SNF level rehab with likely transition to ALF.  Will follow acutely.      Follow Up Recommendations  SNF    Equipment Recommendations  None recommended by OT    Recommendations for Other Services       Precautions / Restrictions Precautions Precautions: Fall Restrictions Weight Bearing Restrictions: No      Mobility Bed Mobility Overal bed mobility: Needs Assistance Bed Mobility: Supine to Sit;Sit to Supine     Supine to sit: Max assist Sit to supine: Mod assist   General bed mobility comments: assist to move LEs off bed and to lift trunk - pt with difficulty maintaining attention to complete task   Transfers Overall transfer level: Needs assistance Equipment used: 1 person hand held assist Transfers: Sit to/from Stand;Stand Pivot Transfers Sit to Stand: Mod assist Stand pivot transfers: Mod assist       General transfer comment: assist to move into standing and assist for balance     Balance Overall balance assessment: Needs assistance Sitting-balance support: Feet supported Sitting balance-Leahy Scale: Fair     Standing balance support: Bilateral upper extremity supported Standing balance-Leahy Scale: Poor Standing balance comment: mod A                              ADL Overall ADL's : Needs assistance/impaired Eating/Feeding: Bed level;Sitting;Minimal assistance   Grooming: Wash/dry hands;Wash/dry face;Oral care;Brushing hair;Moderate assistance;Sitting   Upper Body Bathing: Maximal assistance;Sitting;Bed level   Lower Body Bathing: Total assistance;Sit to/from stand   Upper Body Dressing : Maximal assistance;Sitting   Lower Body Dressing: Total assistance;Sit to/from stand;Bed level Lower Body Dressing Details (indicate cue type and reason): unable to access feet, and requires mod A for balance.   Toilet Transfer: Moderate assistance;Stand-pivot;BSC   Toileting- Clothing Manipulation and Hygiene: Total assistance;Sit to/from stand       Functional mobility during ADLs: Moderate assistance General ADL Comments: mod - max cues to attend to task      Vision     Perception     Praxis      Pertinent Vitals/Pain Pain Assessment: Faces Faces Pain Scale: Hurts little more Pain Location: LEs when scooting up in bed  Pain Descriptors / Indicators: Moaning Pain Intervention(s): Monitored during session;Repositioned     Hand Dominance Right   Extremity/Trunk Assessment Upper Extremity Assessment Upper Extremity Assessment: Generalized weakness;RUE deficits/detail;LUE deficits/detail RUE Deficits / Details: impaired FMC RUE Coordination: decreased fine motor LUE Deficits / Details: impaired FMC  LUE Coordination: decreased fine motor   Lower Extremity Assessment Lower Extremity Assessment: Defer to PT evaluation       Communication Communication Communication: Expressive difficulties (slurred speech )   Cognition Arousal/Alertness: Awake/alert Behavior During Therapy: Restless;Impulsive Overall Cognitive Status: Impaired/Different from baseline Area of Impairment: Orientation;Attention;Memory;Following commands;Safety/judgement;Problem solving Orientation  Level: Disoriented  to;Place;Situation Current Attention Level: Focused;Sustained Memory: Decreased short-term memory Following Commands: Follows one step commands consistently Safety/Judgement: Decreased awareness of safety;Decreased awareness of deficits   Problem Solving: Slow processing;Difficulty sequencing;Requires verbal cues;Requires tactile cues General Comments: Pt very confused - max cues to redirect to task at hand    General Comments       Exercises       Shoulder Instructions      Home Living Family/patient expects to be discharged to:: Skilled nursing facility Living Arrangements: Alone Available Help at Discharge: Family;Available PRN/intermittently Type of Home: House Home Access: Stairs to enter Entergy CorporationEntrance Stairs-Number of Steps: 1 Entrance Stairs-Rails: None Home Layout: Able to live on main level with bedroom/bathroom;Multi-level               Home Equipment: Walker - 2 wheels;Cane - single point;Wheelchair - manual   Additional Comments: history taken from Chart as patient poor historian today and is very confused      Prior Functioning/Environment Level of Independence: Independent with assistive device(s)        Comments: chart notes that per pt's mother, she has needed help caring for herself though pt does not admit to this        OT Problem List: Decreased strength;Decreased activity tolerance;Impaired balance (sitting and/or standing);Decreased coordination;Decreased cognition;Decreased safety awareness;Decreased knowledge of use of DME or AE;Impaired UE functional use   OT Treatment/Interventions: Self-care/ADL training;Therapeutic exercise;DME and/or AE instruction;Therapeutic activities;Cognitive remediation/compensation;Patient/family education;Balance training    OT Goals(Current goals can be found in the care plan section) Acute Rehab OT Goals Patient Stated Goal: did not state  OT Goal Formulation: With patient Time For Goal Achievement:  02/10/16 Potential to Achieve Goals: Fair ADL Goals Pt Will Perform Grooming: with min assist;standing Pt Will Perform Upper Body Bathing: with supervision;sitting Pt Will Perform Lower Body Bathing: with min assist;sit to/from stand Pt Will Transfer to Toilet: with min assist;ambulating;regular height toilet;bedside commode;grab bars Pt Will Perform Toileting - Clothing Manipulation and hygiene: with min assist;sit to/from stand Additional ADL Goal #1: Pt will sustain attention to simple ADL tasks x 3 mins   OT Frequency: Min 2X/week   Barriers to D/C: Decreased caregiver support          Co-evaluation              End of Session Nurse Communication: Mobility status  Activity Tolerance: Patient tolerated treatment well Patient left: in bed;with call bell/phone within reach;with nursing/sitter in room   Time: [id number] OT Time Calculation (min): 28 min Charges:  OT General Charges $OT Visit: 1 Procedure OT Evaluation $OT Eval Moderate Complexity: 1 Procedure OT Treatments $Therapeutic Activity: 8-22 mins $Neuromuscular Re-education: 8-22 mins G-Codes:    Karina Lenderman M 01/27/2016, 11:42 AM

## 2016-01-27 NOTE — Progress Notes (Signed)
Family Medicine Teaching Service Daily Progress Note Intern Pager: [phone]  Patient name: Anne Black Medical record number: [id number] Date of birth: 1960/11/24 Age: 55 y.o. Gender: female  Primary Care Provider: [name], MD Consultants: None Code Status: Full Code  Pt Overview and Major Events to Date:  1. Admit to FMTS  Assessment and Plan: 55 y.o. female presenting with dehydration, generalized pain, ascites. PMH is significant for Alcoholic Liver Cirrhosis with Ascites, Alcohol Abuse, Bipolar DO, anxiety, GERD.   Volume depletion, ongoing: Poor PO intake. Dry MM. Creatinine 0.39 - continue D5 1/2 NS @125cc /hr - 1L NS bolus - Continue to encourage PO intake  Alcoholic Cirrhosis with Ascites:  CIWA scored 13 this AM and given ativan. Continued asterixis this AM.  Received 3 doses lactulose yesterday with 1 BM.  -  bactrim DS day 2 (will do 3 days of BID for UTI coverage and then 1 tab for prophylaxis coverage) - CIWA  - Continue spironolactone and rifaximin - continue with q8hrs lactulose - Thiamine and Folic Acid - PT/OT-- PT>>SNF and 24hr supervision  - Social Work for disposition planning - Consider establishing care with GI in [place] prior to discharge  Hypokalemia: 2.9 this AM. - [id number] BID  UTI: Urinalysis with few bacteria, small bilirubin, moderate leukocytes, positive nitrites. Urine Culture with Proteus mirabilis sensitive to bactrim.  Continues to have dysuria.  - bactrim DS BID for 3 days (day 2/3)  Melenic stool: Black stool reported by nursing. CBC 11.7>10.4>9.7>10.7>10.1 FOBT negative. - High dose PPI to 40mg  twice daily - Monitor CBC  Bipolar Disorder and Anxiety: Prozac 20mg  daily, Neurontin 400mg  TID, Risperdal 0.5mg , and Topamax on med list but uncertain if patient is taking or should be taking. Recently discharged from [facility] (01/08/16) with the following mediction : Prozac 20mg  daily, Gabapentin 400mg  TID, and  Risperdal 0.5mg  at bedtime.  - Gabapentin 400mg  TID, and Risperdal 0.5mg  at bedtime.  - Hold Prozac for now due to QT prolongation (below)  History of Prolonged QTC: QTc 532>518 - Avoid QT prolonging medications   FEN/GI: regular diet Prophylaxis: Lovenox  Disposition: discharge pending medical improvement  Subjective:  Continues to have dysuria, denies any CP, SOB, NVD.   Objective: Temp:  [97.3 F (36.3 C)-98 F (36.7 C)] 97.5 F (36.4 C) (12/19 0700) Pulse Rate:  [25-107] 105 (12/19 0700) Resp:  [17-30] 17 (12/19 0700) BP: (85-128)/(59-89) 108/85 (12/19 0700) SpO2:  [84 %-100 %] 96 % (12/19 0804) Physical Exam: General: Ill-appearing 55 year old female lying in bed in NAD Eyes: non-injected ENTM: dry MM Cardiovascular: RRR, intact distal pulses Respiratory: NWOB, poor air movement, faint bibasilar crackles, mild wheezing diffusely, upper respiratory sounds noted Gastrointestinal: marked ascities, soft, NT, no peritoneal signs and no guarding, +BS Derm: warm and dry Neuro: AAOx2 (thought she was at [facility]), mild asterixis Psych: Normal mood and affect, although has psychomotor slowing and delayed speech  Laboratory:  Recent Labs Lab 01/25/16 2230 01/26/16 0516 01/27/16 0536  WBC 6.7 5.9 5.7  HGB 10.1* 10.2* 10.2*  HCT 32.3* 33.0* 32.4*  PLT 138* 129* 122*    Recent Labs Lab 01/23/16 0702  01/25/16 2230 01/26/16 0516 01/27/16 0536  NA 134*  < > 138 139 139  K 2.4*  < > 2.8* 3.6 2.9*  CL 90*  < > 105 105 105  CO2 35*  < > 28 27 28   BUN <5*  < > <5* <5* <5*  CREATININE 0.61  < > 0.42* 0.39* 0.44  CALCIUM 7.7*  < > 8.2* 8.0* 7.9*  PROT 5.7*  --  5.9*  --  5.1*  BILITOT 3.6*  --  1.9*  --  1.9*  ALKPHOS 164*  --  141*  --  138*  ALT 16  --  17  --  17  AST 43*  --  41  --  39  GLUCOSE 102*  < > 133* 111* 118*  < > = values in this interval not displayed. - LA 1.1 - LDH 228  Imaging/Diagnostic Tests: [name] Paracentesis  Result Date:  01/26/2016 INDICATION: Alcoholic cirrhosis. Recurrent ascites. Request diagnostic and therapeutic paracentesis. EXAM: ULTRASOUND GUIDED RIGHT LOWER QUADRANT PARACENTESIS MEDICATIONS: None. COMPLICATIONS: None immediate. PROCEDURE: Informed written consent was obtained from the patient after a discussion of the risks, benefits and alternatives to treatment. A timeout was performed prior to the initiation of the procedure. Initial ultrasound scanning demonstrates a large amount of ascites within the right lower abdominal quadrant. The right lower abdomen was prepped and draped in the usual sterile fashion. 1% lidocaine with epinephrine was used for local anesthesia. Following this, a Safe-T-Centesis catheter was introduced. An ultrasound image was saved for documentation purposes. The paracentesis was performed. The catheter was removed and a dressing was applied. The patient tolerated the procedure well without immediate post procedural complication. FINDINGS: A total of approximately 2.7 L of clear yellow fluid was removed. Samples were sent to the laboratory as requested by the clinical team. IMPRESSION: Successful ultrasound-guided paracentesis yielding 2.7 liters of peritoneal fluid. Read by: Brayton ElKevin Bruning PA-C Electronically Signed   By: Jolaine ClickArthur  Hoss M.D.   On: 01/26/2016 10:29    Renne Muscaaniel L Jelissa Espiritu, MD 01/27/2016, 8:24 AM PGY-1, [facility] FPTS Intern pager: [phone], text pages welcome

## 2016-01-27 NOTE — Clinical Social Work Note (Addendum)
CSW put 30-day note on chart and asked MD to sign. He stated he would get to it sometime today. CSW will fax to [facility] Must once signed in order to obtain PASARR.  Charlynn CourtSarah Virgil Lightner, CSW [phone]  1:46 pm CSW called patient's mother to see if she had spoken with patient's daughters regarding bed offers. Voicemail is full. Will try again later.  Charlynn CourtSarah Ruchy Wildrick, CSW [phone]  3:36 pm CSW received call back from patient's mother. She discussed bed offers with patient's daughters and encouraged them to go online to review them. Patient's mother will have patient's daughter call CSW with decision. CSW encouraged her to have patient's daughter leave a voicemail with decision if CSW unable to take the call.  Charlynn CourtSarah Chima Astorino, CSW [phone]  4:05 pm Patient's mother called CSW again to confirm [facility], [facility], and [facility]. [facility].  Charlynn CourtSarah Laylonie Marzec, CSW [phone]

## 2016-01-27 NOTE — Progress Notes (Signed)
Interim Progress Note  Noted CIWA score of 30.  Scheduled Ativan had been increased to 2mg  q4h.  Nurse reports that patient seems better than she did prior to 2mg  of ativan.  She thinks that she is in [place] in [address] and keeps wanting to leave.  Noted that she had increased WOB and wheezing when entering room. Reports SOB is "getting there"  PE: Gen: Laying in bed, restless, agitated CV: Tachycardic Pulm: Crackles in bases Abd: Distended with fluid wave Ext: WWP, no edema  Plan:  Pulm:  - Get CXR to assess fluid status - SLIV at least O/N - Lasix IV 40mg  x1  Alcohol withdrawal - Continue to monitor CIWA closely, q1h - Continue scheduled ativan 2mg  q4h prn - Increase frequency of prn ativan to q2h prn - If CIWA continues to be >25, re-consult CCM about possibility of precedex drip   Erasmo Downer, MD, MPH PGY-3,  [facility] 01/27/2016 11:10 PM

## 2016-01-27 NOTE — Progress Notes (Addendum)
CALL PAGER [phone] for any questions or notifications regarding this patient  FMTS Attending Note: Denny LevySara Neal MD Called to see patient for increased agitation. We have asked nursing to see her at next CIWA score evaluation requiring medication. She is obviously confused. Initially she tells me she has to go home. When I declined to send her home now, she became a little agitated. I managed to put off further discussion of this, we gave her 1 mg IV Ativan. I will recheck her a little later this afternoon. I think we'll need to go back to the higher dose of lorazepam. We had attempted to cut that down today secondary to her hepatic issues. I did not want the medication to start stacking on her oversedate her. At this point, I think it would be better to increase that dose back slightly and follow her closely. She is oriented to person and place. She's not rational at this time. She occasionally mixes her words up. She is clearly not competent to make decisions at this point.

## 2016-01-27 NOTE — Progress Notes (Addendum)
Patient becoming increasingly agitated with hallucinations, patient states "I just had a baby."   Patient also says "there is a tombstone in my room."  CIWA increased from 16 to 27.  Dr. Myrtie SomanWarden updated on patients change in condition, physician to come see patient.  Will continue to monitor.

## 2016-01-27 NOTE — Progress Notes (Signed)
PT Cancellation Note  Patient Details Name: Anne Black MRN: [id number] DOB: Dec 05, 1960   Cancelled Treatment:    Reason Eval/Treat Not Completed: Other (comment) pt very confused and restless. Wanting to report everyone to the sheriff. Not appropriate for PT at this time. Will follow up as appropriate.   Blake Divine A Fatina Sprankle 01/27/2016, 2:42 PM Mylo Red, PT, DPT [phone]

## 2016-01-27 NOTE — NC FL2 (Signed)
[place] MEDICAID FL2 LEVEL OF CARE SCREENING TOOL     IDENTIFICATION  Patient Name: Anne Black Birthdate: 31-Jul-1960 Sex: female Admission Date (Current Location): 01/22/2016  [facility] and [place] Number:  Producer, television/film/video and Address:  [facility], [address] [place], [place] [address]      Provider Number: [id number]  Attending Physician Name and Address:  Moses Manners, MD  Relative Name and Phone Number:       Current Level of Care: Hospital Recommended Level of Care: Skilled Nursing Facility Prior Approval Number:    Date Approved/Denied:   PASRR Number: Pending  Discharge Plan:  SNF    Current Diagnoses: Patient Active Problem List   Diagnosis Date Noted  . Ascites   . Tachypnea   . Typical atrial flutter (HCC)   . Hypomagnesemia   . Hypoxia   . Dehydration 01/22/2016  . Low back ache 12/30/2015  . Non-intractable vomiting with nausea   . Alcohol withdrawal (HCC) 12/15/2015  . Acute alcoholism (HCC)   . Alcohol abuse   . Shortness of breath   . Acute respiratory failure (HCC) 10/15/2015  . Ascites due to alcoholic cirrhosis (HCC) 10/15/2015  . Osteoporosis   . Bipolar I disorder (HCC)   . Multiple sclerosis (HCC)   . Alcoholic cirrhosis of liver with ascites (HCC) 08/22/2015  . Bipolar disorder, current episode mixed, moderate (HCC) 08/01/2015  . Alcohol use disorder, severe, dependence (HCC) 07/31/2015  . Macrocytic anemia- due to alcohol abuse with normal B12 & folate levels 05/31/2015  . Severe protein-calorie malnutrition (HCC) 05/31/2015  . Hypokalemia 05/26/2015  . Encephalopathy, hepatic (HCC) 05/26/2015  . Abdominal pain 05/18/2015  . Anxiety 05/18/2015  . Sepsis (HCC) 05/18/2015  . UTI (lower urinary tract infection) 05/18/2015  . Stimulant abuse 12/27/2014  . Nicotine abuse 12/27/2014  . Hyperprolactinemia (HCC) 11/15/2014  . Dyslipidemia   . Noncompliance with therapeutic plan 04/04/2013  .  Unspecified hereditary and idiopathic peripheral neuropathy 05/01/2012  . GERD (gastroesophageal reflux disease) 05/01/2012  . Osteoarthrosis, unspecified whether generalized or localized, involving lower leg 05/01/2012    Orientation RESPIRATION BLADDER Height & Weight     Self  O2 (Nasal Canula 2 L) Continent Weight: 166 lb 10.7 oz (75.6 kg) Height:  5\' 2"  (157.5 cm)  BEHAVIORAL SYMPTOMS/MOOD NEUROLOGICAL BOWEL NUTRITION STATUS   (Impulsive, Poor safety awareness, Getting out of bed, Has a telesitter.)   Continent Diet (Regualr, thin fluid consistency )  AMBULATORY STATUS COMMUNICATION OF NEEDS Skin   Limited Assist Verbally Skin abrasions, Bruising                       Personal Care Assistance Level of Assistance  Bathing, Feeding, Dressing Bathing Assistance: Limited assistance Feeding assistance: Independent Dressing Assistance: Limited assistance     Functional Limitations Info  Sight, Hearing, Speech Sight Info: Adequate Hearing Info: Adequate Speech Info: Adequate    SPECIAL CARE FACTORS FREQUENCY  PT (By licensed PT), OT (By licensed OT)     PT Frequency: 5x OT Frequency: 5x            Contractures Contractures Info: Not present    Additional Factors Info  Code Status, Allergies Code Status Info: Full Allergies Info: No known allergies            Current Medications (01/27/2016):  This is the current hospital active medication list Current Facility-Administered Medications  Medication Dose Route Frequency Provider Last Rate  Last Dose  . dextrose 5 %-0.9 % sodium chloride infusion   Intravenous Continuous Leighton Roachodd D McDiarmid, MD 125 mL/hr at 01/27/16 0534    . enoxaparin (LOVENOX) injection 40 mg  40 mg Subcutaneous Q24H Moses MannersWilliam A Hensel, MD   40 mg at 01/26/16 0912  . folic acid (FOLVITE) tablet 1 mg  1 mg Oral Daily Palma HolterKanishka G Gunadasa, MD   1 mg at 01/26/16 0912  . gabapentin (NEURONTIN) capsule 200 mg  200 mg Oral TID Leighton Roachodd D McDiarmid, MD   200  mg at 01/26/16 2128  . ipratropium-albuterol (DUONEB) 0.5-2.5 (3) MG/3ML nebulizer solution 3 mL  3 mL Nebulization Q4H PRN Asiyah Mayra ReelZahra Mikell, MD   3 mL at 01/26/16 0101  . ipratropium-albuterol (DUONEB) 0.5-2.5 (3) MG/3ML nebulizer solution 3 mL  3 mL Nebulization Once [facility] N Rumley, DO      . ipratropium-albuterol (DUONEB) 0.5-2.5 (3) MG/3ML nebulizer solution 3 mL  3 mL Nebulization QID Moses MannersWilliam A Hensel, MD   3 mL at 01/27/16 0804  . lactulose (CHRONULAC) 10 GM/15ML solution 30 g  30 g Oral TID Garth BignessKathryn Timberlake, MD   30 g at 01/26/16 2129  . LORazepam (ATIVAN) tablet 0.5 mg  0.5 mg Oral Q4H PRN [facility] N Rumley, DO   0.5 mg at 01/27/16 0851   Or  . LORazepam (ATIVAN) injection 0.5 mg  0.5 mg Intravenous Q4H PRN Crowley N Rumley, DO   0.5 mg at 01/26/16 1708  . multivitamin with minerals tablet 1 tablet  1 tablet Oral Daily Palma HolterKanishka G Gunadasa, MD   1 tablet at 01/26/16 0911  . nicotine (NICODERM CQ - dosed in mg/24 hours) patch 21 mg  21 mg Transdermal Daily Garth BignessKathryn Timberlake, MD   21 mg at 01/26/16 1450  . pantoprazole (PROTONIX) EC tablet 40 mg  40 mg Oral BID Renne Muscaaniel L Warden, MD   40 mg at 01/26/16 2128  . potassium chloride SA (K-DUR,KLOR-CON) CR tablet 40 mEq  40 mEq Oral BID Renne Muscaaniel L Warden, MD      . rifaximin Burman Blacksmith(XIFAXAN) tablet 550 mg  550 mg Oral BID Moses MannersWilliam A Hensel, MD   550 mg at 01/26/16 2128  . risperiDONE (RISPERDAL) tablet 0.5 mg  0.5 mg Oral QHS Palma HolterKanishka G Gunadasa, MD   0.5 mg at 01/26/16 2128  . spironolactone (ALDACTONE) tablet 100 mg  100 mg Oral Daily Leighton Roachodd D McDiarmid, MD   100 mg at 01/26/16 0911  . sulfamethoxazole-trimethoprim (BACTRIM DS,SEPTRA DS) 800-160 MG per tablet 1 tablet  1 tablet Oral Q12H Renne Muscaaniel L Warden, MD   1 tablet at 01/26/16 1709  . thiamine (VITAMIN B-1) tablet 100 mg  100 mg Oral Daily Palma HolterKanishka G Gunadasa, MD   100 mg at 01/26/16 0911     Discharge Medications: Please see discharge summary for a list of discharge medications.  Relevant  Imaging Results:  Relevant Lab Results:   Additional Information ss#[id number]  Margarito LinerSarah C Abdoulie Tierce, LCSW

## 2016-01-27 NOTE — Care Management Note (Signed)
Case Management Note  Patient Details  Name: Anne Black MRN: [id number] Date of Birth: [birth year]  Subjective/Objective:  Patient with volume depletion, alcoholic cirrhosis with ascites, uti, bipolar, s/p paracentesis on 12/18.  CIWA was 18 on night of 12/17, having halluncinations, ativan given q4, on ivf's, lactulose, and xifaxan.  CSW following for snf, NCM asked MD if it was possible patient could just be on lacutulose due to the expensiveness of xifaxan and plan is for snf.  NCM will cont to follow .                  Action/Plan:   Expected Discharge Date:                  Expected Discharge Plan:  Skilled Nursing Facility  In-House Referral:  Clinical Social Work  Discharge planning Services     Post Acute Care Choice:    Choice offered to:     DME Arranged:    DME Agency:     HH Arranged:    HH Agency:     Status of Service:  Completed, signed off  If discussed at [employer], dates discussed:    Additional Comments:  Leone Haven, RN 01/27/2016, 11:09 AM

## 2016-01-28 ENCOUNTER — Inpatient Hospital Stay (HOSPITAL_COMMUNITY): Payer: Managed Care, Other (non HMO)

## 2016-01-28 DIAGNOSIS — R109 Unspecified abdominal pain: Secondary | ICD-10-CM

## 2016-01-28 LAB — BLOOD GAS, ARTERIAL
ACID-BASE EXCESS: 4.1 mmol/L — AB (ref 0.0–2.0)
Bicarbonate: 27.9 mmol/L (ref 20.0–28.0)
DRAWN BY: 213381
O2 Content: 2 L/min
O2 Saturation: 94.9 %
PATIENT TEMPERATURE: 98.6
PH ART: 7.454 — AB (ref 7.350–7.450)
pCO2 arterial: 40.3 mmHg (ref 32.0–48.0)
pO2, Arterial: 76.3 mmHg — ABNORMAL LOW (ref 83.0–108.0)

## 2016-01-28 LAB — BASIC METABOLIC PANEL
Anion gap: 7 (ref 5–15)
CHLORIDE: 105 mmol/L (ref 101–111)
CO2: 23 mmol/L (ref 22–32)
Calcium: 8 mg/dL — ABNORMAL LOW (ref 8.9–10.3)
Creatinine, Ser: 0.41 mg/dL — ABNORMAL LOW (ref 0.44–1.00)
GFR calc Af Amer: >60 mL/min (ref >60)
GFR calc non Af Amer: >60 mL/min (ref >60)
GLUCOSE: 97 mg/dL (ref 65–99)
Potassium: 5.5 mmol/L — ABNORMAL HIGH (ref 3.5–5.1)
Sodium: 135 mmol/L (ref 135–145)

## 2016-01-28 LAB — CBC
HCT: 33.3 % — ABNORMAL LOW (ref 36.0–46.0)
HEMOGLOBIN: 10.6 g/dL — AB (ref 12.0–15.0)
MCH: 29.5 pg (ref 26.0–34.0)
MCHC: 31.8 g/dL (ref 30.0–36.0)
MCV: 92.8 fL (ref 78.0–100.0)
Platelets: 123 10*3/uL — ABNORMAL LOW (ref 150–400)
RBC: 3.59 MIL/uL — AB (ref 3.87–5.11)
RDW: 18.1 % — ABNORMAL HIGH (ref 11.5–15.5)
WBC: 7.9 10*3/uL (ref 4.0–10.5)

## 2016-01-28 LAB — POTASSIUM: POTASSIUM: 3.6 mmol/L (ref 3.5–5.1)

## 2016-01-28 LAB — PATHOLOGIST SMEAR REVIEW

## 2016-01-28 MED ORDER — IPRATROPIUM-ALBUTEROL 0.5-2.5 (3) MG/3ML IN SOLN
3.0000 mL | Freq: Three times a day (TID) | RESPIRATORY_TRACT | Status: DC
  Administered 2016-01-29 – 2016-01-31 (×7): 3 mL via RESPIRATORY_TRACT
  Filled 2016-01-28 (×8): qty 3

## 2016-01-28 MED ORDER — FUROSEMIDE 10 MG/ML IJ SOLN
80.0000 mg | Freq: Once | INTRAMUSCULAR | Status: AC
  Administered 2016-01-28: 80 mg via INTRAVENOUS

## 2016-01-28 MED ORDER — ONDANSETRON HCL 4 MG/2ML IJ SOLN: 4.0000 mg | Freq: Three times a day (TID) | INTRAMUSCULAR | Status: DC

## 2016-01-28 MED ORDER — IPRATROPIUM-ALBUTEROL 0.5-2.5 (3) MG/3ML IN SOLN
3.0000 mL | Freq: Once | RESPIRATORY_TRACT | Status: AC
  Administered 2016-01-28: 3 mL via RESPIRATORY_TRACT

## 2016-01-28 MED ORDER — FUROSEMIDE 10 MG/ML IJ SOLN
INTRAMUSCULAR | Status: AC
  Filled 2016-01-28: qty 8

## 2016-01-28 MED ORDER — PROCHLORPERAZINE EDISYLATE 5 MG/ML IJ SOLN
5.0000 mg | Freq: Once | INTRAMUSCULAR | Status: AC
  Administered 2016-01-28: 5 mg via INTRAVENOUS
  Filled 2016-01-28: qty 2

## 2016-01-28 MED ORDER — IPRATROPIUM-ALBUTEROL 0.5-2.5 (3) MG/3ML IN SOLN: 3.0000 mL | RESPIRATORY_TRACT | Status: DC

## 2016-01-28 NOTE — Clinical Social Work Note (Signed)
Patient's mother called CSW asking for a direct phone number to patient's RN. CSW provided unit phone number. CSW asked for patient's daughter's phone number ([name]): [phone].  Charlynn CourtSarah Nyx Keady, CSW [phone]

## 2016-01-28 NOTE — Progress Notes (Signed)
Called for increased RR, belly breathing, and increased agitation by RN. CIWA scores increasing up to 16 most recently. Got 1 Ativan PRN at 1153. Just got 1 duoneb, added another now. On my exam, 2+ pitting sacral edema, diffuse wheezing throughout, along with tight breath sounds. Patient disoriented, not agitated. Perseverating on the fact that she had twins yesterday, which was actually 20+ years ago.   Ordered:  -STAT ABG -CXR 1 view stat -1 additional duoneb -lasix 80mg  IV once  Seen with upper level resident. Loni Muse, MD

## 2016-01-28 NOTE — Progress Notes (Signed)
Patient experiencing increased work of breathing with audible wheezing and RR maintaining in the 30's.  Pt. CIWA score trending up with increased confusion and anxiety.  Administered PRN ativan and scheduled nebulizer treatment.  Updated Dr. Chanetta Marshall on patients change in condition, MD to come and assess.  Will continue to monitor.

## 2016-01-28 NOTE — Progress Notes (Signed)
Family Medicine Teaching Service Daily Progress Note Intern Pager: [phone]  Patient name: Anne ApplebaumCarla S Youngers Medical record number: [id number] Date of birth: Feb 16, 1960 Age: 55 y.o. Gender: female  Primary Care Provider: Palma HolterKanishka G Gunadasa, MD Consultants: None Code Status: Full Code  Pt Overview and Major Events to Date:  1. Admit to FMTS  Assessment and Plan: 55 y.o. female presenting with dehydration, generalized pain, ascites. PMH is significant for Alcoholic Liver Cirrhosis with Ascites, Alcohol Abuse, Bipolar DO, anxiety, GERD.   Volume depletion, ongoing: Poor PO intake. Dry MM. Creatinine 0.39 - continue D5 1/2 NS @125cc /hr - Continue to encourage PO intake  Alcoholic Cirrhosis with Ascites: CIWA up to 30 last night. Ativan scheduled 2mg  q4hrs and 1mg  q2hrs PRN.  Notified CCM of CIWA scores.  Down to 8 this AM.  -  bactrim DS day 3 (will do 3 days of BID for UTI coverage and then 1 tab for prophylaxis coverage) - CIWA  - Continue spironolactone and rifaximin - continue with q8hrs lactulose - Thiamine and Folic Acid - PT/OT-- PT>>SNF and 24hr supervision  - Social Work for disposition planning - Consider establishing care with GI in [place] prior to discharge  Hypokalemia: ?5.5 this AM. Hemolyzed sample - AM BMP  UTI: Urinalysis with few bacteria, small bilirubin, moderate leukocytes, positive nitrites. Urine Culture with Proteus mirabilis sensitive to bactrim.  Continues to have dysuria.  - bactrim DS BID for 3 days (day 3/3)  Melenic stool: Black stool reported by nursing. CBC 11.7>10.4>9.7>10.7>10.1>10.6 FOBT negative. - High dose PPI to 40mg  twice daily - Monitor CBC  Bipolar Disorder and Anxiety: Prozac 20mg  daily, Neurontin 400mg  TID, Risperdal 0.5mg , and Topamax on med list but uncertain if patient is taking or should be taking. Recently discharged from [facility] (01/08/16) with the following mediction : Prozac 20mg  daily, Gabapentin 400mg  TID,  and Risperdal 0.5mg  at bedtime.  - Gabapentin 400mg  TID, and Risperdal 0.5mg  at bedtime.  - Hold Prozac for now due to QT prolongation (below)  History of Prolonged QTC: QTc 532>518 - Avoid QT prolonging medications   FEN/GI: regular diet Prophylaxis: Lovenox  Disposition: discharge pending medical improvement  Subjective:  Continues to have dysuria, denies any CP, SOB, NVD.   Objective: Temp:  [98.2 F (36.8 C)-99.8 F (37.7 C)] 99.8 F (37.7 C) (12/20 0742) Pulse Rate:  [62-108] 103 (12/20 0742) Resp:  [26-36] 26 (12/20 0742) BP: (101-131)/(59-94) 117/76 (12/20 0742) SpO2:  [95 %-100 %] 96 % (12/20 0742) Physical Exam: General: Ill-appearing 55 year old female lying in bed in NAD Eyes: non-injected ENTM: dry MM Cardiovascular: RRR, intact distal pulses Respiratory: NWOB, poor air movement, faint bibasilar crackles, mild wheezing diffusely, upper respiratory sounds noted Gastrointestinal: marked ascities, soft, NT, no peritoneal signs and no guarding, +BS Derm: warm and dry Neuro: alert to herself, mild asterixis Psych: confused with psychomotor slowing and delayed speech  Laboratory:  Recent Labs Lab 01/25/16 2230 01/26/16 0516 01/27/16 0536  WBC 6.7 5.9 5.7  HGB 10.1* 10.2* 10.2*  HCT 32.3* 33.0* 32.4*  PLT 138* 129* 122*    Recent Labs Lab 01/23/16 0702  01/25/16 2230 01/26/16 0516 01/27/16 0536  NA 134*  < > 138 139 139  K 2.4*  < > 2.8* 3.6 2.9*  CL 90*  < > 105 105 105  CO2 35*  < > 28 27 28   BUN <5*  < > <5* <5* <5*  CREATININE 0.61  < > 0.42* 0.39* 0.44  CALCIUM 7.7*  < >  8.2* 8.0* 7.9*  PROT 5.7*  --  5.9*  --  5.1*  BILITOT 3.6*  --  1.9*  --  1.9*  ALKPHOS 164*  --  141*  --  138*  ALT 16  --  17  --  17  AST 43*  --  41  --  39  GLUCOSE 102*  < > 133* 111* 118*  < > = values in this interval not displayed. - LA 1.1 - LDH 228  Imaging/Diagnostic Tests: Dg Chest Port 1 View  Result Date: 01/28/2016 CLINICAL DATA:  55 year old  female with increasing shortness of breath and wheezing. EXAM: PORTABLE CHEST 1 VIEW COMPARISON:  Chest radiograph dated 01/25/2016 FINDINGS: Minimal bibasilar densities, left greater right most likely atelectatic changes. Developing infiltrate is less likely but not entirely excluded. Clinical correlation is recommended. Mild diffuse interstitial coarsening as seen on the prior radiograph. No pleural effusion or pneumothorax. The cardiac silhouette is within normal limits with no acute osseous pathology. IMPRESSION: Bibasilar densities, likely atelectatic changes. Developing infiltrates are not excluded. Electronically Signed   By: Elgie Collard M.D.   On: 01/28/2016 01:21    Renne Musca, MD 01/28/2016, 8:09 AM PGY-1, [facility] Family Medicine FPTS Intern pager: [phone], text pages welcome

## 2016-01-29 ENCOUNTER — Inpatient Hospital Stay (HOSPITAL_COMMUNITY): Payer: Managed Care, Other (non HMO)

## 2016-01-29 LAB — BASIC METABOLIC PANEL
ANION GAP: 8 (ref 5–15)
BUN: <5 mg/dL — ABNORMAL LOW (ref 6–20)
CO2: 27 mmol/L (ref 22–32)
Calcium: 8.5 mg/dL — ABNORMAL LOW (ref 8.9–10.3)
Chloride: 103 mmol/L (ref 101–111)
Creatinine, Ser: 0.57 mg/dL (ref 0.44–1.00)
GLUCOSE: 118 mg/dL — AB (ref 65–99)
POTASSIUM: 3.5 mmol/L (ref 3.5–5.1)
Sodium: 138 mmol/L (ref 135–145)

## 2016-01-29 LAB — CBC
HCT: 32.2 % — ABNORMAL LOW (ref 36.0–46.0)
Hemoglobin: 10.3 g/dL — ABNORMAL LOW (ref 12.0–15.0)
MCH: 29.5 pg (ref 26.0–34.0)
MCHC: 32 g/dL (ref 30.0–36.0)
MCV: 92.3 fL (ref 78.0–100.0)
PLATELETS: 133 10*3/uL — AB (ref 150–400)
RBC: 3.49 MIL/uL — AB (ref 3.87–5.11)
RDW: 18 % — ABNORMAL HIGH (ref 11.5–15.5)
WBC: 6.2 10*3/uL (ref 4.0–10.5)

## 2016-01-29 LAB — ALBUMIN: Albumin: 2.5 g/dL — ABNORMAL LOW (ref 3.5–5.0)

## 2016-01-29 MED ORDER — LORAZEPAM 1 MG PO TABS
1.0000 mg | ORAL_TABLET | ORAL | Status: DC | PRN
  Administered 2016-02-01 – 2016-02-02 (×2): 1 mg via ORAL
  Filled 2016-01-29: qty 1

## 2016-01-29 MED ORDER — LORAZEPAM 2 MG/ML IJ SOLN
1.0000 mg | INTRAMUSCULAR | Status: DC | PRN
  Administered 2016-01-29 – 2016-02-02 (×9): 1 mg via INTRAVENOUS
  Filled 2016-01-29 (×10): qty 1

## 2016-01-29 MED ORDER — SODIUM CHLORIDE 0.9% FLUSH
3.0000 mL | Freq: Two times a day (BID) | INTRAVENOUS | Status: DC
  Administered 2016-01-29 – 2016-02-05 (×15): 3 mL via INTRAVENOUS

## 2016-01-29 MED ORDER — SODIUM CHLORIDE 0.9% FLUSH: 3.0000 mL | INTRAVENOUS | Status: DC | PRN

## 2016-01-29 MED ORDER — SULFAMETHOXAZOLE-TRIMETHOPRIM 800-160 MG PO TABS
1.0000 | ORAL_TABLET | Freq: Every day | ORAL | Status: DC
  Administered 2016-01-29 – 2016-02-06 (×9): 1 via ORAL
  Filled 2016-01-29 (×9): qty 1

## 2016-01-29 MED ORDER — LACTULOSE ENEMA: 300.0000 mL | Freq: Once | ORAL | Status: DC

## 2016-01-29 MED ORDER — LACTULOSE ENEMA
300.0000 mL | Freq: Three times a day (TID) | ORAL | Status: DC
  Administered 2016-01-29: 300 mL via RECTAL
  Filled 2016-01-29 (×2): qty 300

## 2016-01-29 NOTE — Progress Notes (Signed)
Pt exhibiting wheezing this morning, duoneb given, still wheezy.  MD on call notified.  No new orders given, but team will assess this AM.  Will continue to monitor.

## 2016-01-29 NOTE — Care Management Important Message (Signed)
Important Message  Patient Details  Name: Anne Black MRN: [id number] Date of Birth: Jun 27, 1960   Medicare Important Message Given:  Yes    Kyla Balzarine 01/29/2016, 10:54 AM

## 2016-01-29 NOTE — Progress Notes (Signed)
Family Medicine Teaching Service Daily Progress Note Intern Pager: [phone]  Patient name: Anne Black Medical record number: [id number] Date of birth: August 26, 1960 Age: 55 y.o. Gender: female  Primary Care Provider: [name], MD Consultants: None Code Status: Full Code  Pt Overview and Major Events to Date:  1. Admit to FMTS  Assessment and Plan: 55 y.o. female presenting with dehydration, generalized pain, ascites. PMH is significant for Alcoholic Liver Cirrhosis with Ascites, Alcohol Abuse, Bipolar DO, anxiety, GERD.   Volume depletion, ongoing: Poor PO intake. Dry MM. Creatinine 0.39.  Mildly fluid overloaded yesterday with 2L output s/p 80IV laxis.  Stopped fluids last night.  Dry MM this AM with orange urine.  - Continue to encourage PO intake - will reevaluate in afternoon and add MIVF if appropriate.   Alcoholic Cirrhosis with Ascites: CIWA scoring 7-8 overnight and up to 18 this AM Ativan scheduled 2mg  q4hrs and 1mg  q2hrs PRN.  1BM past 24hrs.  Alert only to herself this AM.  Unclear if this is due to encephalopathy or alcohol withdrawal vs: scheduled ativan.  -  bactrim DS 1 tab daily for SBP prophylaxis - cont scheduled ativan and PRN doses as above - CIWA  - Continue spironolactone and rifaximin - continue with q8hrs lactulose - Thiamine and Folic Acid - PT/OT-- PT>>SNF and 24hr supervision  - Social Work for disposition planning - Consider establishing care with GI in [place] prior to discharge  Hypokalemia: AM K+ pending - replete as needed - AM BMP  UTI: Urinalysis with few bacteria, small bilirubin, moderate leukocytes, positive nitrites. Urine Culture with Proteus mirabilis sensitive to bactrim.  Denies dysuria - finished course, cont to monitor - remaining on bactrim DS once daily for SBP prophylaxis   Melenic stool: Black stool reported by nursing. CBC stable. FOBT negative. - High dose PPI to 40mg  twice daily - Monitor CBC  Bipolar  Disorder and Anxiety: Prozac 20mg  daily, Neurontin 400mg  TID, Risperdal 0.5mg , and Topamax on med list but uncertain if patient is taking or should be taking. Recently discharged from [facility] (01/08/16) with the following mediction : Prozac 20mg  daily, Gabapentin 400mg  TID, and Risperdal 0.5mg  at bedtime.  - Gabapentin 400mg  TID, and Risperdal 0.5mg  at bedtime.  - Hold Prozac for now due to QT prolongation (below)  History of Prolonged QTC: QTc 532>518 - Avoid QT prolonging medications   FEN/GI: regular diet Prophylaxis: Lovenox  Disposition: discharge pending medical improvement  Subjective:  Denies dysuria, CP, SOB, NVD.  Alert only to herself this AM.   Objective: Temp:  [97.4 F (36.3 C)-99 F (37.2 C)] 98.8 F (37.1 C) (12/21 0700) Pulse Rate:  [101-110] 101 (12/21 0700) Resp:  [17-32] 25 (12/21 0700) BP: (111-132)/(57-88) 111/66 (12/21 0700) SpO2:  [93 %-100 %] 97 % (12/21 0700) Weight:  [157 lb (71.2 kg)-174 lb (78.9 kg)] 174 lb (78.9 kg) (12/21 0500) Physical Exam: General: Ill-appearing 55 year old female lying in bed in NAD Eyes: non-injected ENTM: dry MM Cardiovascular: RRR, intact distal pulses Respiratory: NWOB, poor air movement, faint bibasilar crackles, mild wheezing diffusely, upper respiratory sounds noted Gastrointestinal: marked ascities, soft, NT, no peritoneal signs and no guarding, +BS Derm: warm and dry Neuro: alert to herself Psych: confused with psychomotor slowing and delayed speech  Laboratory:  Recent Labs Lab 01/26/16 0516 01/27/16 0536 01/28/16 0812  WBC 5.9 5.7 7.9  HGB 10.2* 10.2* 10.6*  HCT 33.0* 32.4* 33.3*  PLT 129* 122* 123*    Recent Labs Lab 01/23/16 [phone]  01/25/16 2230 01/26/16 0516 01/27/16 0536 01/28/16 0812 01/28/16 1027  NA 134*  < > 138 139 139 135  --   K 2.4*  < > 2.8* 3.6 2.9* 5.5* 3.6  CL 90*  < > 105 105 105 105  --   CO2 35*  < > 28 27 28 23   --   BUN <5*  < > <5* <5* <5* <5*  --    CREATININE 0.61  < > 0.42* 0.39* 0.44 0.41*  --   CALCIUM 7.7*  < > 8.2* 8.0* 7.9* 8.0*  --   PROT 5.7*  --  5.9*  --  5.1*  --   --   BILITOT 3.6*  --  1.9*  --  1.9*  --   --   ALKPHOS 164*  --  141*  --  138*  --   --   ALT 16  --  17  --  17  --   --   AST 43*  --  41  --  39  --   --   GLUCOSE 102*  < > 133* 111* 118* 97  --   < > = values in this interval not displayed. - LA 1.1 - LDH 228  Imaging/Diagnostic Tests: Dg Chest Port 1 View  Result Date: 01/28/2016 CLINICAL DATA:  Shortness of breath EXAM: PORTABLE CHEST 1 VIEW COMPARISON:  01/27/2016 FINDINGS: The heart size and mediastinal contours are within normal limits. Both lungs are clear. The visualized skeletal structures are unremarkable. IMPRESSION: No active disease. Electronically Signed   By: Elige KoHetal  Patel   On: 01/28/2016 13:19    Renne Muscaaniel L Diyari Cherne, MD 01/29/2016, 8:59 AM PGY-1, [facility] Family Medicine FPTS Intern pager: [phone], text pages welcome

## 2016-01-29 NOTE — Progress Notes (Signed)
Patient made NPO as per MD order for diagnosis of ileus by KUB.  See results.  Abdomen continues to increase in distention and no fluid wave observed.  Tympanic to percussion.

## 2016-01-29 NOTE — Progress Notes (Signed)
PT Cancellation Note  Patient Details Name: Anne Black MRN: [id number] DOB: December 23, 1960   Cancelled Treatment:    Reason Eval/Treat Not Completed: Other (comment) Hold per RN as pt just given ativan and sleeping. Continues to be confused and withdrawing. Will follow up as appropriate/time allows.   Blake Divine A Tocara Mennen 01/29/2016, 10:54 AM  Mylo Red, PT, DPT [phone]

## 2016-01-29 NOTE — Progress Notes (Signed)
OT Cancellation Note  Patient Details Name: Anne Black MRN: [id number] DOB: 12-Feb-1960   Cancelled Treatment:    Reason Eval/Treat Not Completed: Medical issues which prohibited therapy.  Pt going through withdrawal and given Ativan.  Will try back.  Yehonatan Grandison Doran, OTR/L [phone]   Jeani Hawking M 01/29/2016, 2:51 PM

## 2016-01-30 LAB — CBC
HEMATOCRIT: 31.7 % — AB (ref 36.0–46.0)
HEMOGLOBIN: 10 g/dL — AB (ref 12.0–15.0)
MCH: 29.9 pg (ref 26.0–34.0)
MCHC: 31.5 g/dL (ref 30.0–36.0)
MCV: 94.6 fL (ref 78.0–100.0)
Platelets: 156 10*3/uL (ref 150–400)
RBC: 3.35 MIL/uL — AB (ref 3.87–5.11)
RDW: 18.5 % — ABNORMAL HIGH (ref 11.5–15.5)
WBC: 6.9 10*3/uL (ref 4.0–10.5)

## 2016-01-30 LAB — BASIC METABOLIC PANEL
ANION GAP: 6 (ref 5–15)
BUN: <5 mg/dL — ABNORMAL LOW (ref 6–20)
CHLORIDE: 104 mmol/L (ref 101–111)
CO2: 28 mmol/L (ref 22–32)
Calcium: 8.2 mg/dL — ABNORMAL LOW (ref 8.9–10.3)
Creatinine, Ser: 0.56 mg/dL (ref 0.44–1.00)
GFR calc non Af Amer: >60 mL/min (ref >60)
Glucose, Bld: 107 mg/dL — ABNORMAL HIGH (ref 65–99)
POTASSIUM: 4.8 mmol/L (ref 3.5–5.1)
Sodium: 138 mmol/L (ref 135–145)

## 2016-01-30 MED ORDER — LORAZEPAM 2 MG/ML IJ SOLN
1.0000 mg | INTRAMUSCULAR | Status: DC
  Administered 2016-01-30 – 2016-02-01 (×8): 1 mg via INTRAVENOUS
  Filled 2016-01-30 (×9): qty 1

## 2016-01-30 MED ORDER — LACTULOSE 10 GM/15ML PO SOLN
30.0000 g | Freq: Three times a day (TID) | ORAL | Status: DC
  Administered 2016-01-30 – 2016-02-05 (×18): 30 g via ORAL
  Filled 2016-01-30 (×21): qty 45

## 2016-01-30 NOTE — Progress Notes (Signed)
Physical Therapy Treatment Patient Details Name: NALANY LAUB MRN: [id number] DOB: 1960-06-22 Today's Date: 01/30/2016    History of Present Illness 55 y.o. female presenting with dehydration, generalized pain, ascites. PMH is significant for Alcoholic Liver Cirrhosis with Ascites, Alcohol Abuse, Bipolar DO, anxiety, GERD    PT Comments    Pt presented supine in bed with HOB elevated, initially asleep but easily aroused. Pt fluctuating between lethargy and agitation throughout session, limiting her participation in therapy session. Pt required max A for rolling in bed with use of bed pad. Attempted to sit EOB with initiating bilateral LEs off of bed; however, pt became very agitated at that point, bringing her LEs back up onto bed and refusing to further participate in therapeutic interventions. Pt would continue to benefit from skilled physical therapy services at this time while admitted and after d/c to address her limitations in order to improve her overall safety and independence with functional mobility.   Follow Up Recommendations  SNF;Supervision/Assistance - 24 hour     Equipment Recommendations  None recommended by PT    Recommendations for Other Services       Precautions / Restrictions Precautions Precautions: Fall Restrictions Weight Bearing Restrictions: No    Mobility  Bed Mobility Overal bed mobility: Needs Assistance Bed Mobility: Rolling Rolling: Max assist         General bed mobility comments: pt required max A with use of bed pad to roll towards her R side. Attempted to sit EOB with initiation of LEs off of the bed but pt became very agitated and positioned LEs back onto the bed and refused to further participate.  Transfers                    Ambulation/Gait                 Stairs            Wheelchair Mobility    Modified Rankin (Stroke Patients Only)       Balance                                     Cognition Arousal/Alertness: Lethargic Behavior During Therapy: Agitated;Flat affect Overall Cognitive Status: No family/caregiver present to determine baseline cognitive functioning Area of Impairment: Attention;Memory;Following commands;Safety/judgement;Awareness;Problem solving Orientation Level: Disoriented to;Situation Current Attention Level: Focused;Sustained Memory: Decreased short-term memory Following Commands: Follows one step commands inconsistently Safety/Judgement: Decreased awareness of safety;Decreased awareness of deficits   Problem Solving: Slow processing;Difficulty sequencing;Requires verbal cues;Requires tactile cues      Exercises      General Comments        Pertinent Vitals/Pain Pain Assessment: Faces Faces Pain Scale: Hurts a little bit Pain Location: abdomen Pain Descriptors / Indicators: Sore Pain Intervention(s): Monitored during session    Home Living                      Prior Function            PT Goals (current goals can now be found in the care plan section) Acute Rehab PT Goals Patient Stated Goal: go home PT Goal Formulation: With patient Time For Goal Achievement: 02/08/16 Potential to Achieve Goals: Fair Progress towards PT goals: Progressing toward goals    Frequency    Min 2X/week      PT Plan Current plan remains appropriate    Co-evaluation  End of Session   Activity Tolerance: Treatment limited secondary to agitation;Patient limited by lethargy Patient left: in bed;with call bell/phone within reach;Other (comment) (mittens reapplied and telesitter on)     Time: 1533-1550 PT Time Calculation (min) (ACUTE ONLY): 17 min  Charges:  $Therapeutic Activity: 8-22 mins                    G CodesAlessandra Bevels:      Sukaina Toothaker M Bridgette Wolden 01/30/2016, 4:23 PM Deborah ChalkJennifer Cheryll Keisler, PT, DPT [phone]

## 2016-01-30 NOTE — Progress Notes (Signed)
Occupational Therapy Treatment Patient Details Name: Anne Black MRN: [id number] DOB: 09-09-1960 Today's Date: 01/30/2016    History of present illness 55 y.o. female presenting with dehydration, generalized pain, ascites. PMH is significant for Alcoholic Liver Cirrhosis with Ascites, Alcohol Abuse, Bipolar DO, anxiety, GERD   OT comments  Pt initially asleep, but able to arouse her adequately to move to EOB with max A.  She sat ~8 mins with max A, progressing to min A.  Initially with heavy Rt and posterior lean.  She remains very confused and unable to engage in ADL activities.    Follow Up Recommendations  SNF    Equipment Recommendations  None recommended by OT    Recommendations for Other Services      Precautions / Restrictions Precautions Precautions: Fall Restrictions Weight Bearing Restrictions: No       Mobility Bed Mobility Overal bed mobility: Needs Assistance Bed Mobility: Supine to Sit;Sit to Supine Rolling: Max assist   Supine to sit: Max assist Sit to supine: Mod assist   General bed mobility comments: asssist to initiate movement.  she was able to assist with lifting trunk   Transfers                      Balance Overall balance assessment: Needs assistance Sitting-balance support: Feet supported Sitting balance-Leahy Scale: Poor Sitting balance - Comments: Pt required max A, progressing to min A for 2-3 mins.  She demonstrated heavy Rt and posterior lean.  Worked on facilitation of anterior translation which improved balance, until she fatigued  Postural control: Posterior lean;Right lateral lean                         ADL Overall ADL's : Needs assistance/impaired     Grooming: Wash/dry hands;Wash/dry face;Brushing hair;Total assistance;Sitting Grooming Details (indicate cue type and reason): unable to sustain attention                                       Vision                     Perception      Praxis      Cognition   Behavior During Therapy: Flat affect;Restless;Impulsive Overall Cognitive Status: Impaired/Different from baseline Area of Impairment: Attention;Memory;Following commands;Safety/judgement;Awareness;Problem solving;Orientation Orientation Level: Disoriented to;Situation Current Attention Level: Focused Memory: Decreased short-term memory  Following Commands: Follows one step commands inconsistently Safety/Judgement: Decreased awareness of safety;Decreased awareness of deficits   Problem Solving: Slow processing;Difficulty sequencing;Requires verbal cues;Requires tactile cues General Comments: confused.  yelling for her children.  She was oriented to place.  "march... November... December 2017"      Extremity/Trunk Assessment               Exercises     Shoulder Instructions       General Comments      Pertinent Vitals/ Pain       Pain Assessment: Faces Faces Pain Scale: Hurts a little bit Pain Location: abdomen Pain Descriptors / Indicators: Sore Pain Intervention(s): Monitored during session  Home Living                                          Prior Functioning/Environment  Frequency  Min 2X/week        Progress Toward Goals  OT Goals(current goals can now be found in the care plan section)  Progress towards OT goals: Not progressing toward goals - comment  Acute Rehab OT Goals Patient Stated Goal: go home  Plan Discharge plan remains appropriate    Co-evaluation                 End of Session     Activity Tolerance Patient limited by fatigue;Other (comment) (cognition )   Patient Left in bed;with call bell/phone within reach;with bed alarm set   Nurse Communication Mobility status        Time: [id number] OT Time Calculation (min): 12 min  Charges: OT General Charges $OT Visit: 1 Procedure OT Treatments $Therapeutic Activity: 8-22 mins  Marjani Kobel  M 01/30/2016, 5:09 PM

## 2016-01-30 NOTE — Clinical Social Work Note (Signed)
H&P, FL2, and 30 day note faxed to [facility] for review for [facility].  Charlynn Court, CSW [phone]

## 2016-01-30 NOTE — Progress Notes (Signed)
Family Medicine Teaching Service Daily Progress Note Intern Pager: [phone]  Patient name: Anne Black Medical record number: [id number] Date of birth: August 31, 1960 Age: 55 y.o. Gender: female  Primary Care Provider: [name], MD Consultants: None Code Status: Full Code  Assessment and Plan: 55 y.o. female presenting with dehydration, generalized pain, ascites. PMH is significant for Alcoholic Liver Cirrhosis with Ascites, Alcohol Abuse, Bipolar DO, anxiety, GERD.   #Volume depletion, ongoing Patient continue to have poor PO intake. Creatinine 0.56. Patient was made NPO in the setting of possible ileus vs obstruction. --Continue to encourage PO intake --Continue MIVF if appropriate.   #Alcoholic Cirrhosis with Ascites, resolving Last CIWA score 5 up to 10 overnight. Unclear if this is due to encephalopathy vs alcohol withdrawal vs scheduled ativan.  --Bactrim DS 1 tab daily for SBP prophylaxis --Continue scheduled ativan 1mg  q4 --Continue  ativan 1 mg q2 PRN for CIWA>8 --Continue spironolactone 100 mg --Continue rifaximin 550 mg bid --Continue Lactulose 200 g q8 --Continue Thiamine and Folic Acid --PT/OT-- PT>>SNF and 24hr supervision  --Follow up with Social Work to see if pt qualify for SNF  --Consider establishing care with GI in [place] prior to discharge  #UTI, resolved   Urinalysis with few bacteria, small bilirubin, moderate leukocytes, positive nitrites. Urine Culture with Proteus mirabilis sensitive to bactrim.  Denies dysuria --Completed CFTX course --Remaining on bactrim DS once daily for SBP prophylaxis   #Bipolar Disorder and Anxiety Prozac 20mg  daily, Neurontin 400mg  TID, Risperdal 0.5mg , and Topamax on med list but uncertain if patient is taking or should be taking. Recently discharged from [facility] (01/08/16) --Continue Gabapentin 400mg  TID, --Continue Risperdal 0.5mg  qhs --Hold Prozac 20 mg daily for now due to QT prolongation    History of Prolonged QTC: QTc 532>518 - Avoid QT prolonging medications   FEN/GI: regular diet Prophylaxis: Lovenox, Protonix  Disposition: Discharge pending medical improvement  Subjective:  Patient is more oriented this morning, patient able to tell me name, location, date of birth and current president. Patient had multiple BMs in the past 24hrs and has been requested some fluid.  Objective: Temp:  [98.1 F (36.7 C)-98.8 F (37.1 C)] 98.6 F (37 C) (12/22 0017) Pulse Rate:  [96-102] 96 (12/22 0017) Resp:  [18-29] 18 (12/22 0017) BP: (103-122)/(56-73) 103/67 (12/22 0017) SpO2:  [94 %-97 %] 94 % (12/22 0017) Weight:  [169 lb (76.7 kg)] 169 lb (76.7 kg) (12/22 0500)  Physical Exam: General: Ill-appearing 55 year old female lying in bed in NAD Eyes: non-injected ENTM: dry MM Cardiovascular: RRR, intact distal pulses Respiratory: NWOB, poor air movement, faint bibasilar crackles, mild wheezing diffusely, upper respiratory sounds noted Gastrointestinal: Improved ascites no fluid wave, soft, moderately distended, no peritoneal signs and no guarding, +BS Derm: warm and dry Neuro: alert to herself Psych: confused with psychomotor slowing and delayed speech  Laboratory:  Recent Labs Lab 01/27/16 0536 01/28/16 0812 01/29/16 1048  WBC 5.7 7.9 6.2  HGB 10.2* 10.6* 10.3*  HCT 32.4* 33.3* 32.2*  PLT 122* 123* 133*    Recent Labs Lab 01/23/16 0702  01/25/16 2230  01/27/16 0536 01/28/16 0812 01/28/16 1027 01/29/16 1048  NA 134*  < > 138  < > 139 135  --  138  K 2.4*  < > 2.8*  < > 2.9* 5.5* 3.6 3.5  CL 90*  < > 105  < > 105 105  --  103  CO2 35*  < > 28  < > 28 23  --  27  BUN <5*  < > <5*  < > <5* <5*  --  <5*  CREATININE 0.61  < > 0.42*  < > 0.44 0.41*  --  0.57  CALCIUM 7.7*  < > 8.2*  < > 7.9* 8.0*  --  8.5*  PROT 5.7*  --  5.9*  --  5.1*  --   --   --   BILITOT 3.6*  --  1.9*  --  1.9*  --   --   --   ALKPHOS 164*  --  141*  --  138*  --   --   --   ALT 16   --  17  --  17  --   --   --   AST 43*  --  41  --  39  --   --   --   GLUCOSE 102*  < > 133*  < > 118* 97  --  118*  < > = values in this interval not displayed. - LA 1.1 - LDH 228  Imaging/Diagnostic Tests: Dg Abd 1 View  Result Date: 01/29/2016 CLINICAL DATA:  Abdominal distention EXAM: ABDOMEN - 1 VIEW COMPARISON:  01/25/2016.  01/23/2016 FINDINGS: Interval progression of diffuse gaseous bowel distention with a central predominance. Visualized bony anatomy is unremarkable. Telemetry leads overlie the lower chest. IMPRESSION: Gaseous bowel distention in the central abdomen compatible with ileus or evolving obstruction. Centralization of bowel loops suggests associated ascites. Electronically Signed   By: Kennith CenterEric  Mansell M.D.   On: 01/29/2016 16:57    Lovena NeighboursAbdoulaye Anaisha Mago, MD 01/30/2016, 6:53 AM PGY-1, [place] [facility] FPTS Intern pager: [phone], text pages welcome

## 2016-01-31 ENCOUNTER — Inpatient Hospital Stay (HOSPITAL_COMMUNITY): Payer: Managed Care, Other (non HMO)

## 2016-01-31 DIAGNOSIS — R140 Abdominal distension (gaseous): Secondary | ICD-10-CM

## 2016-01-31 DIAGNOSIS — R0600 Dyspnea, unspecified: Secondary | ICD-10-CM

## 2016-01-31 LAB — GRAM STAIN

## 2016-01-31 LAB — BODY FLUID CELL COUNT WITH DIFFERENTIAL
LYMPHS FL: 26 %
Monocyte-Macrophage-Serous Fluid: 71 % (ref 50–90)
Neutrophil Count, Fluid: 2 % (ref 0–25)
Total Nucleated Cell Count, Fluid: 123 cu mm (ref 0–1000)

## 2016-01-31 LAB — CULTURE, BODY FLUID W GRAM STAIN -BOTTLE: Culture: NO GROWTH

## 2016-01-31 LAB — LACTATE DEHYDROGENASE, PLEURAL OR PERITONEAL FLUID: LD FL: 66 U/L — AB (ref 3–23)

## 2016-01-31 LAB — CULTURE, BODY FLUID-BOTTLE

## 2016-01-31 LAB — ALBUMIN, FLUID (OTHER): Albumin, Fluid: <1 g/dL

## 2016-01-31 LAB — PROTEIN, BODY FLUID: Total protein, fluid: <3 g/dL

## 2016-01-31 MED ORDER — LIDOCAINE HCL (PF) 1 % IJ SOLN
INTRAMUSCULAR | Status: AC
  Administered 2016-01-31: 10:00:00
  Filled 2016-01-31: qty 10

## 2016-01-31 MED ORDER — SODIUM CHLORIDE 0.9 % IV BOLUS (SEPSIS)
250.0000 mL | Freq: Once | INTRAVENOUS | Status: AC
  Administered 2016-01-31: 250 mL via INTRAVENOUS

## 2016-01-31 NOTE — Progress Notes (Signed)
Family Medicine Teaching Service Daily Progress Note Intern Pager: [phone]  Patient name: Anne Black Medical record number: [id number] Date of birth: 1960/12/28 Age: 55 y.o. Gender: female  Primary Care Provider: [name], MD Consultants: None Code Status: Full Code  Assessment and Plan: 55 y.o. female presenting with dehydration, generalized pain, ascites. PMH is significant for Alcoholic Liver Cirrhosis with Ascites, Alcohol Abuse, Bipolar DO, anxiety, GERD.   #Volume depletion, ongoing Patient continue to have poor PO intake. Creatinine 0.56 (12/22).  NPO (12/21) 2/2 concern from ileus vs obstruction. Patient had multiple BMs less concerning for obstruction. NPO status d/ced on 12/22 and diet advance to heart healthy. --Follow up on am BMP. --Has not eaten over past 24 hours, will add IV fluids at 50cc/hr x 12 hours  #Alcoholic Cirrhosis with Ascites, resolving CIWA score ranging between 3-18. Will continue to monitor. Patient is on SBP prophylaxis. Paracentesis 12/22, fluid with SAAG gradient of 1.5, likely reflective of portal hypertension. No organisms on gram stain.  --Continue Bactrim 800-160 mg 1 tab daily DAY 4 (start 12/21) -Criteria for SBP prophylaxis include hx of SBP, ascitic fluid protein <1g/dL, variceal hemorrhage. Lab for total ascitic fluid below limit of our test and <3, continue prophylaxis while hospitalized and for future hospitalizations --Overnight, scheduled ativan increased to 2mg  q4h given scores increasing to 18, but decreased to 1mg  q4 this am --Continue  ativan 1 mg q2 PRN for CIWA>8 --Continue spironolactone 100 mg --Continue rifaximin 550 mg bid --Continue Lactulose 200 g q8 --Continue Thiamine and Folic Acid --PT/OT-- PT>>SNF and 24hr supervision  --Follow up with Social Work to see if pt qualify for SNF  --Consider establishing care with GI in [place] prior to discharge  #UTI, resolved  --Completed CFTX course --On bactrim DS  for SBP prophylaxis   #Bipolar Disorder and Anxiety Prozac 20mg  daily, Neurontin 400mg  TID, Risperdal 0.5mg , and Topamax on med list but uncertain if patient is taking or should be taking. Recently discharged from [facility] (01/08/16).  --Continue Gabapentin 400mg  TID --Continue Risperdal 0.5mg  qhs --Hold Prozac 20 mg daily for now due to QT prolongation  --Sitter has been ordered (12/23)  #History of Prolonged QTC: QTc 532>518 --Avoid QT prolonging medications   FEN/GI: Heart Healthy Prophylaxis: Lovenox, Protonix  Disposition: Discharge pending medical improvement  Subjective:  Patient resting peacefully after an eventful night (spitting at staff, attempting to walk out of the room).   Objective: Temp:  [97.8 F (36.6 C)-98.6 F (37 C)] 98.6 F (37 C) (12/23 2000) Pulse Rate:  [88-97] 97 (12/23 2000) Resp:  [15-32] 15 (12/23 2000) BP: (91-117)/(47-93) 117/57 (12/23 2000) SpO2:  [84 %-99 %] 98 % (12/23 2000) Weight:  [176 lb (79.8 kg)] 176 lb (79.8 kg) (12/23 0356)  Physical Exam: General: Ill-appearing 55 year old female lying in bed in NAD Cardiovascular: RRR, intact distal pulses Respiratory: NWOB, poor air movement, faint bibasilar crackles, mild wheezing diffusely, upper respiratory sounds noted Gastrointestinal: Improved ascites no fluid wave, soft, moderately distended, no peritoneal signs and no guarding, +BS Derm: warm and dry Neuro: Alert and Oriented x3 Psych: Speech is still slow but improving, mood appropriate  Laboratory:  Recent Labs Lab 01/28/16 0812 01/29/16 1048 01/30/16 0651  WBC 7.9 6.2 6.9  HGB 10.6* 10.3* 10.0*  HCT 33.3* 32.2* 31.7*  PLT 123* 133* 156    Recent Labs Lab 01/25/16 2230  01/27/16 0536 01/28/16 0812 01/28/16 1027 01/29/16 1048 01/30/16 0651  NA 138  < > 139 135  --  138 138  K 2.8*  < > 2.9* 5.5* 3.6 3.5 4.8  CL 105  < > 105 105  --  103 104  CO2 28  < > 28 23  --  27 28  BUN <5*  < > <5* <5*  --   <5* <5*  CREATININE 0.42*  < > 0.44 0.41*  --  0.57 0.56  CALCIUM 8.2*  < > 7.9* 8.0*  --  8.5* 8.2*  PROT 5.9*  --  5.1*  --   --   --   --   BILITOT 1.9*  --  1.9*  --   --   --   --   ALKPHOS 141*  --  138*  --   --   --   --   ALT 17  --  17  --   --   --   --   AST 41  --  39  --   --   --   --   GLUCOSE 133*  < > 118* 97  --  118* 107*  < > = values in this interval not displayed. - LA 1.1 - LDH 228  Imaging/Diagnostic Tests: [name] Paracentesis  Result Date: 01/31/2016 INDICATION: Recurrent ascites EXAM: ULTRASOUND-GUIDED PARACENTESIS COMPARISON:  Previous paracentesis MEDICATIONS: 10 cc 1% lidocaine COMPLICATIONS: None immediate. TECHNIQUE: Informed written consent was obtained from the patient after a discussion of the risks, benefits and alternatives to treatment. A timeout was performed prior to the initiation of the procedure. Initial ultrasound scanning demonstrates a large amount of ascites within the right lower abdominal quadrant. The right lower abdomen was prepped and draped in the usual sterile fashion. 1% lidocaine with epinephrine was used for local anesthesia. Under direct ultrasound guidance, a 19 gauge, 7-cm, Yueh catheter was introduced. An ultrasound image was saved for documentation purposed. The paracentesis was performed. The catheter was removed and a dressing was applied. The patient tolerated the procedure well without immediate post procedural complication. FINDINGS: A total of approximately 3 liters of yellow fluid was removed. Samples were sent to the laboratory as requested by the clinical team. IMPRESSION: Successful ultrasound-guided paracentesis yielding 3 liters of peritoneal fluid. Read by Robet LeuPamela A [facility] Electronically Signed   By: Jolaine ClickArthur  Hoss M.D.   On: 01/31/2016 12:04     Garth BignessKathryn Timberlake, MD 01/31/2016, 9:42 PM PGY-1, [facility] FPTS Intern pager: [phone], text pages welcome

## 2016-01-31 NOTE — Progress Notes (Signed)
Family Medicine Teaching Service Daily Progress Note Intern Pager: [phone]  Patient name: Anne Black Medical record number: [id number] Date of birth: Dec 04, 1960 Age: 55 y.o. Gender: female  Primary Care Provider: [name], MD Consultants: None Code Status: Full Code  Assessment and Plan: 55 y.o. female presenting with dehydration, generalized pain, ascites. PMH is significant for Alcoholic Liver Cirrhosis with Ascites, Alcohol Abuse, Bipolar DO, anxiety, GERD.   #Volume depletion, ongoing Patient continue to have poor PO intake. Creatinine 0.56 (12/22).  NPO (12/21) 2/2 concern from ileus vs obstruction. Patient had multiple BMs less concerning for obstruction. NPO status d/ced on 12/22 and diet advance to heart healthy. --Continue to encourage PO intake. --Follow up on am BMP. --Follow up volume status and consider starting MIVF if needed.  #Alcoholic Cirrhosis with Ascites, resolving CIWA score ranging between 4-7. Patient more alert and oriented. Will continue to monitor. Patient is on SBP prophylaxis. Patient will receive paracentesis this morning. --Continue Bactrim 800-160 mg 1 tab daily DAY 3 (start 12/21) --Continue scheduled ativan 1mg  q4 --Continue  ativan 1 mg q2 PRN for CIWA>8 --Follow up on paracentesis labs --Continue spironolactone 100 mg --Continue rifaximin 550 mg bid --Continue Lactulose 200 g q8 --Continue Thiamine and Folic Acid --PT/OT-- PT>>SNF and 24hr supervision  --Follow up with Social Work to see if pt qualify for SNF  --Consider establishing care with GI in [place] prior to discharge  #UTI, resolved  --Completed CFTX course --On bactrim DS for SBP prophylaxis   #Bipolar Disorder and Anxiety Prozac 20mg  daily, Neurontin 400mg  TID, Risperdal 0.5mg , and Topamax on med list but uncertain if patient is taking or should be taking. Recently discharged from [facility] (01/08/16). --Continue Gabapentin 400mg  TID, --Continue  Risperdal 0.5mg  qhs --Hold Prozac 20 mg daily for now due to QT prolongation  --Ophelia Charter has been order (12/23)  #History of Prolonged QTC: QTc 532>518 --Avoid QT prolonging medications   FEN/GI: Heart Healthy Prophylaxis: Lovenox, Protonix  Disposition: Discharge pending medical improvement  Subjective:  Patient continue to improve and is less altered and more oriented. Patient endorse some abdominal pain most likely secondary to abdominal distention. Patient has an appetite and would to eat this morning.   Objective: Temp:  [97.8 F (36.6 C)-99 F (37.2 C)] 98.2 F (36.8 C) (12/23 0715) Pulse Rate:  [89-96] 91 (12/23 0715) Resp:  [21-30] 28 (12/23 0715) BP: (92-117)/(49-93) 99/64 (12/23 0715) SpO2:  [93 %-99 %] 99 % (12/23 0900) Weight:  [176 lb (79.8 kg)] 176 lb (79.8 kg) (12/23 0356)  Physical Exam: General: Ill-appearing 55 year old female lying in bed in NAD Eyes: non-injected ENTM: dry MM Cardiovascular: RRR, intact distal pulses Respiratory: NWOB, poor air movement, faint bibasilar crackles, mild wheezing diffusely, upper respiratory sounds noted Gastrointestinal: Improved ascites no fluid wave, soft, moderately distended, no peritoneal signs and no guarding, +BS Derm: warm and dry Neuro: Alert and Oriented x3 Psych: Speech is still slow but improving, mood appropriate  Laboratory:  Recent Labs Lab 01/28/16 0812 01/29/16 1048 01/30/16 0651  WBC 7.9 6.2 6.9  HGB 10.6* 10.3* 10.0*  HCT 33.3* 32.2* 31.7*  PLT 123* 133* 156    Recent Labs Lab 01/25/16 2230  01/27/16 0536 01/28/16 0812 01/28/16 1027 01/29/16 1048 01/30/16 0651  NA 138  < > 139 135  --  138 138  K 2.8*  < > 2.9* 5.5* 3.6 3.5 4.8  CL 105  < > 105 105  --  103 104  CO2 28  < >  28 23  --  27 28  BUN <5*  < > <5* <5*  --  <5* <5*  CREATININE 0.42*  < > 0.44 0.41*  --  0.57 0.56  CALCIUM 8.2*  < > 7.9* 8.0*  --  8.5* 8.2*  PROT 5.9*  --  5.1*  --   --   --   --   BILITOT 1.9*  --  1.9*   --   --   --   --   ALKPHOS 141*  --  138*  --   --   --   --   ALT 17  --  17  --   --   --   --   AST 41  --  39  --   --   --   --   GLUCOSE 133*  < > 118* 97  --  118* 107*  < > = values in this interval not displayed. - LA 1.1 - LDH 228  Imaging/Diagnostic Tests: No results found.    Lovena NeighboursAbdoulaye Avyan Livesay, MD 01/31/2016, 9:39 AM PGY-1, [facility] FPTS Intern pager: [phone], text pages welcome

## 2016-02-01 LAB — BASIC METABOLIC PANEL
ANION GAP: 5 (ref 5–15)
BUN: <5 mg/dL — ABNORMAL LOW (ref 6–20)
CALCIUM: 7.9 mg/dL — AB (ref 8.9–10.3)
CO2: 27 mmol/L (ref 22–32)
Chloride: 103 mmol/L (ref 101–111)
Creatinine, Ser: 0.49 mg/dL (ref 0.44–1.00)
GFR calc non Af Amer: >60 mL/min (ref >60)
Glucose, Bld: 100 mg/dL — ABNORMAL HIGH (ref 65–99)
Potassium: 3.7 mmol/L (ref 3.5–5.1)
Sodium: 135 mmol/L (ref 135–145)

## 2016-02-01 LAB — CBC
HCT: 30.3 % — ABNORMAL LOW (ref 36.0–46.0)
HEMOGLOBIN: 9.6 g/dL — AB (ref 12.0–15.0)
MCH: 29 pg (ref 26.0–34.0)
MCHC: 31.7 g/dL (ref 30.0–36.0)
MCV: 91.5 fL (ref 78.0–100.0)
Platelets: 157 10*3/uL (ref 150–400)
RBC: 3.31 MIL/uL — AB (ref 3.87–5.11)
RDW: 17.8 % — ABNORMAL HIGH (ref 11.5–15.5)
WBC: 5.7 10*3/uL (ref 4.0–10.5)

## 2016-02-01 MED ORDER — LORAZEPAM 2 MG/ML IJ SOLN
2.0000 mg | INTRAMUSCULAR | Status: DC
  Administered 2016-02-01: 2 mg via INTRAVENOUS
  Filled 2016-02-01: qty 1

## 2016-02-01 MED ORDER — LORAZEPAM 2 MG/ML IJ SOLN
0.5000 mg | Freq: Four times a day (QID) | INTRAMUSCULAR | Status: DC
  Administered 2016-02-02 – 2016-02-03 (×4): 0.5 mg via INTRAVENOUS
  Filled 2016-02-01 (×4): qty 1

## 2016-02-01 MED ORDER — LORAZEPAM 2 MG/ML IJ SOLN
1.0000 mg | INTRAMUSCULAR | Status: DC
  Administered 2016-02-01: 1 mg via INTRAVENOUS

## 2016-02-01 MED ORDER — BOOST / RESOURCE BREEZE PO LIQD
1.0000 | Freq: Three times a day (TID) | ORAL | Status: DC
  Administered 2016-02-01 – 2016-02-03 (×4): 1 via ORAL

## 2016-02-01 MED ORDER — DEXTROSE-NACL 5-0.9 % IV SOLN
INTRAVENOUS | Status: AC
  Administered 2016-02-01: 07:00:00 via INTRAVENOUS

## 2016-02-01 MED ORDER — HALOPERIDOL LACTATE 5 MG/ML IJ SOLN
2.0000 mg | Freq: Once | INTRAMUSCULAR | Status: AC
  Administered 2016-02-01: 2 mg via INTRAVENOUS
  Filled 2016-02-01: qty 1

## 2016-02-02 MED ORDER — HALOPERIDOL LACTATE 5 MG/ML IJ SOLN: 1.0000 mg | Freq: Once | INTRAMUSCULAR | Status: DC

## 2016-02-02 MED ORDER — WHITE PETROLATUM GEL
Status: AC
  Administered 2016-02-02: 1
  Filled 2016-02-02: qty 1

## 2016-02-02 NOTE — Progress Notes (Signed)
Wasted 2mg  IV vial with Tereasa Coop RN. Pulled Ativan 1mg . Patient's IV infiltrated. 1mg  PO given. IV Ativan was never given due to IV infiltrated.

## 2016-02-02 NOTE — Progress Notes (Signed)
Family Medicine Teaching Service Daily Progress Note Intern Pager: [phone]  Patient name: Anne Black Medical record number: [id number] Date of birth: 1961/01/08 Age: [age] y.o. Gender: female  Primary Care Provider: Palma HolterKanishka G Gunadasa, MD Consultants: None Code Status: Full Code  Assessment and Plan: 55 y.o. female presenting with dehydration, generalized pain, ascites. PMH is significant for Alcoholic Liver Cirrhosis with Ascites, Alcohol Abuse, Bipolar DO, anxiety, GERD.   #Volume depletion, ongoing. Continues to have poor PO intake. Has been fine balance between low volume status Intravascularly and elevated fluid status Extravascularly requiring lasix.  --Nutrition consulted --Boost  #Alcoholic Cirrhosis with Ascites, resolving. CIWA 0>11>10>15>15>14.   --Continue Bactrim 800-160 mg 1 tab daily DAY 5 (start 12/21) --Scheduled Ativan 0.5mg  q6hr, 1 mg q2 PRN for CIWA>8 --Consider as needed Haldol for agitation. Caution use due to prolonged QT. Will repeat EKG today. --Continue Spironolactone 100 mg --Continue Rifaximin 550 mg bid --Continue Lactulose 200 g q8 --Continue Thiamine and Folic Acid --PT/OT-- PT>>SNF and 24hr supervision  --Social Work --[place]  #Bipolar Disorder and Anxiety. Prozac 20mg  daily, Neurontin 400mg  TID, Risperdal 0.5mg , and Topamax on med list but uncertain if patient is taking or should be taking. Recently discharged from [facility] (01/08/16).  --Continue Gabapentin 400mg  TID --Continue Risperdal 0.5mg  qhs --Hold Prozac 20 mg daily for now due to QT prolongation  --Corporate investment bankerContinue Sitter --Consider Psych consult  #History of Prolonged QTC: QTc 532>518 --Avoid QT prolonging medications  --Repeat EKG today  FEN/GI: Heart Healthy Prophylaxis: Lovenox, Protonix  Disposition: Discharge pending medical improvement  Subjective:  Family present at bedside. Continues to note  agitation and anxiety. Denies pain or shortness of breath.   Objective: Temp:  [97.7 F (36.5 C)-98.1 F (36.7 C)] 98.1 F (36.7 C) (12/25 0406) Pulse Rate:  [87-92] 91 (12/25 0406) Resp:  [20-25] 22 (12/25 0406) BP: (98-112)/(50-80) 98/51 (12/25 0406) SpO2:  [92 %-95 %] 95 % (12/25 0406) Weight:  [167 lb (75.8 kg)] 167 lb (75.8 kg) (12/25 0406)  Physical Exam: General: 55 year old female lying in bed in NAD Cardiovascular: RRR, intact distal pulses Respiratory: no increased work of breathing noted, [place] in place, mild wheezing diffusely Gastrointestinal:Soft and moderately distended with some ascites present Derm: warm and dry Neuro: Alert and Oriented x3 Psych: Speech is still slow but improving, mood appropriate  Laboratory:  Recent Labs Lab 01/29/16 1048 01/30/16 0651 02/01/16 0416  WBC 6.2 6.9 5.7  HGB 10.3* 10.0* 9.6*  HCT 32.2* 31.7* 30.3*  PLT 133* 156 157    Recent Labs Lab 01/27/16 0536  01/29/16 1048 01/30/16 0651 02/01/16 0416  NA 139  < > 138 138 135  K 2.9*  < > 3.5 4.8 3.7  CL 105  < > 103 104 103  CO2 28  < > 27 28 27   BUN <5*  < > <5* <5* <5*  CREATININE 0.44  < > 0.57 0.56 0.49  CALCIUM 7.9*  < > 8.5* 8.2* 7.9*  PROT 5.1*  --   --   --   --   BILITOT 1.9*  --   --   --   --   ALKPHOS 138*  --   --   --   --   ALT 17  --   --   --   --   AST 39  --   --   --   --   GLUCOSE 118*  < > 118* 107*  100*  < > = values in this interval not displayed. - LA 1.1 - [facility] [address] [place], DO 02/02/2016, 8:43 AM PGY-3, [place] [facility] FPTS Intern pager: [phone], text pages welcome

## 2016-02-03 DIAGNOSIS — Z8249 Family history of ischemic heart disease and other diseases of the circulatory system: Secondary | ICD-10-CM

## 2016-02-03 DIAGNOSIS — F10231 Alcohol dependence with withdrawal delirium: Secondary | ICD-10-CM

## 2016-02-03 DIAGNOSIS — R45851 Suicidal ideations: Secondary | ICD-10-CM

## 2016-02-03 DIAGNOSIS — Z79899 Other long term (current) drug therapy: Secondary | ICD-10-CM

## 2016-02-03 DIAGNOSIS — F17210 Nicotine dependence, cigarettes, uncomplicated: Secondary | ICD-10-CM

## 2016-02-03 LAB — CBC
HEMATOCRIT: 33 % — AB (ref 36.0–46.0)
HEMOGLOBIN: 10.5 g/dL — AB (ref 12.0–15.0)
MCH: 29.2 pg (ref 26.0–34.0)
MCHC: 31.8 g/dL (ref 30.0–36.0)
MCV: 91.7 fL (ref 78.0–100.0)
Platelets: 156 10*3/uL (ref 150–400)
RBC: 3.6 MIL/uL — AB (ref 3.87–5.11)
RDW: 18.1 % — ABNORMAL HIGH (ref 11.5–15.5)
WBC: 6.6 10*3/uL (ref 4.0–10.5)

## 2016-02-03 LAB — BASIC METABOLIC PANEL
ANION GAP: 6 (ref 5–15)
CHLORIDE: 101 mmol/L (ref 101–111)
CO2: 28 mmol/L (ref 22–32)
Calcium: 8.3 mg/dL — ABNORMAL LOW (ref 8.9–10.3)
Creatinine, Ser: 0.45 mg/dL (ref 0.44–1.00)
GFR calc Af Amer: >60 mL/min (ref >60)
GFR calc non Af Amer: >60 mL/min (ref >60)
GLUCOSE: 103 mg/dL — AB (ref 65–99)
POTASSIUM: 3.4 mmol/L — AB (ref 3.5–5.1)
Sodium: 135 mmol/L (ref 135–145)

## 2016-02-03 LAB — PATHOLOGIST SMEAR REVIEW

## 2016-02-03 MED ORDER — ENSURE ENLIVE PO LIQD
237.0000 mL | Freq: Two times a day (BID) | ORAL | Status: DC
  Administered 2016-02-03 – 2016-02-04 (×3): 237 mL via ORAL

## 2016-02-03 MED ORDER — THIAMINE HCL 100 MG/ML IJ SOLN
500.0000 mg | Freq: Three times a day (TID) | INTRAVENOUS | Status: DC
  Administered 2016-02-04 – 2016-02-06 (×8): 500 mg via INTRAVENOUS
  Filled 2016-02-03 (×11): qty 5

## 2016-02-03 MED ORDER — IPRATROPIUM-ALBUTEROL 0.5-2.5 (3) MG/3ML IN SOLN
3.0000 mL | RESPIRATORY_TRACT | Status: DC
  Administered 2016-02-03: 3 mL via RESPIRATORY_TRACT
  Filled 2016-02-03: qty 3

## 2016-02-03 MED ORDER — ALBUTEROL SULFATE (2.5 MG/3ML) 0.083% IN NEBU: 2.5000 mg | INHALATION_SOLUTION | RESPIRATORY_TRACT | Status: DC | PRN

## 2016-02-03 MED ORDER — IPRATROPIUM-ALBUTEROL 0.5-2.5 (3) MG/3ML IN SOLN
3.0000 mL | RESPIRATORY_TRACT | Status: DC
  Filled 2016-02-03: qty 3

## 2016-02-03 MED ORDER — IPRATROPIUM-ALBUTEROL 0.5-2.5 (3) MG/3ML IN SOLN: 3.0000 mL | RESPIRATORY_TRACT | Status: DC

## 2016-02-03 NOTE — Progress Notes (Signed)
Family Medicine Teaching Service Daily Progress Note Intern Pager: [phone]  Patient name: Anne Black Medical record number: [id number] Date of birth: [birth year] Age: 55 y.o. Gender: female  Primary Care Provider: [name], MD Consultants: None Code Status: Full Code  Assessment and Plan: 55 y.o. female presenting with dehydration, generalized pain, ascites. PMH is significant for Alcoholic Liver Cirrhosis with Ascites, Alcohol Abuse, Bipolar DO, anxiety, GERD.   #Alcoholic Cirrhosis with Ascites, resolving. CIWA 0>12>10>2 --Continue Bactrim 800-160 mg 1 tab daily (start 12/21) for SBP prophylaxis --Discontinue scheduled ativan. Continue Antivan 1 mg q2 PRN for CIWA>8 --Consider as needed Haldol for agitation. Note QT 480. --Continue Spironolactone 100 mg --Continue Rifaximin 550 mg bid --Continue Lactulose 200 g q8. Has been more consistent with doses for two days now.  --Continue Thiamine and Folic Acid --PT/OT-- PT>>SNF and 24hr supervision  --Social Work --Psych consulted to help differentiate Withdrawal vs. Encephalopathy vs. Underlying worsening psychiatric disorder.Marland Kitchen Appreciate recommendations.  --Consider establishing care with GI in [facility] prior to discharge --Transfer out of SDU  #Bipolar Disorder and Anxiety. Prozac 20mg  daily, Neurontin 400mg  TID, Risperdal 0.5mg , and Topamax on med list but uncertain if patient is taking or should be taking. Recently discharged from [facility] (01/08/16).  --Continue Gabapentin 400mg  TID --Continue Risperdal 0.5mg  qhs --Hold Prozac 20 mg daily for now due to QT prolongation  --Continue Sitter --Psych consulted, appreciate recommendations.   #Volume depletion, ongoing. Continues to have poor PO intake. Has been fine balance between low volume status Intravascularly and elevated fluid status Extravascularly requiring lasix.  --Nutrition consulted --Boost --Encourage PO intake.   #History of  Prolonged QTC: QTc [phone] --Avoid QT prolonging medications   FEN/GI: Heart Healthy Prophylaxis: Lovenox, Protonix  Disposition: Discharge pending medical improvement  Subjective:  Reports she is very thirsty and hungry today. Notes chronic pain. Denies any other acute concerns.   Objective: Temp:  [97.6 F (36.4 C)-99 F (37.2 C)] 97.6 F (36.4 C) (12/26 0723) Pulse Rate:  [86-99] 88 (12/26 0723) Resp:  [14-30] 24 (12/26 0723) BP: (90-116)/(43-68) 98/61 (12/26 0723) SpO2:  [93 %-100 %] 93 % (12/26 0723) Weight:  [167 lb (75.8 kg)] 167 lb (75.8 kg) (12/26 0500)  Physical Exam: General: 55 year old female lying in bed in NAD Cardiovascular: RRR, intact distal pulses Respiratory: no increased work of breathing noted, [place] in place, mild wheezing diffusely Gastrointestinal:Soft and moderately distended with some ascites present Derm: warm and dry Neuro: Alert and Oriented x1 (Person) Psych: Speech is still slow but improving, mood appropriate  Laboratory:  Recent Labs Lab 01/30/16 0651 02/01/16 0416 02/03/16 0444  WBC 6.9 5.7 6.6  HGB 10.0* 9.6* 10.5*  HCT 31.7* 30.3* 33.0*  PLT 156 157 156    Recent Labs Lab 01/30/16 0651 02/01/16 0416 02/03/16 0444  NA 138 135 135  K 4.8 3.7 3.4*  CL 104 103 101  CO2 28 27 28   BUN <5* <5* <5*  CREATININE 0.56 0.49 0.45  CALCIUM 8.2* 7.9* 8.3*  GLUCOSE 107* 100* 103*   - LA 1.1 - [facility] [address] [place], DO 02/03/2016, 8:04 AM PGY-3, [place] [facility] FPTS Intern pager: [phone], text pages welcome

## 2016-02-03 NOTE — Progress Notes (Signed)
Physical Therapy Treatment Patient Details Name: Anne Black MRN: [id number] DOB: 05-29-1960 Today's Date: 02/03/2016    History of Present Illness 55 y.o. female presenting with dehydration, generalized pain, ascites. PMH is significant for Alcoholic Liver Cirrhosis with Ascites, Alcohol Abuse, Bipolar DO, anxiety, GERD    PT Comments    Patient slow to progress due to recently lethargy, but this date easily aroused, but fearful of falling.  Was unable to achieve safe standing and still +2 A for safe transfers.  Will need SNF level rehab at d/c.  Follow Up Recommendations  SNF;Supervision/Assistance - 24 hour     Equipment Recommendations  None recommended by PT    Recommendations for Other Services       Precautions / Restrictions Precautions Precautions: Fall    Mobility  Bed Mobility Overal bed mobility: Needs Assistance Bed Mobility: Rolling;Sidelying to Sit Rolling: Mod assist Sidelying to sit: Max assist       General bed mobility comments: able to reach for rail initially, then needed assist to maintain grip and utilize for side to sit, assist for legs off bed and lifting trunk  Transfers Overall transfer level: Needs assistance Equipment used: None Transfers: Sit to/from [employer] Transfers Sit to Stand: Max assist;+2 physical assistance Stand pivot transfers: Max assist;+2 physical assistance       General transfer comment: pt fearful and needing max A for anterior weight shift; pivot at first to 3:1 and pt pushing it away as attempting to pivot towards, +2 A for safety due to fearful and high fall risk  Ambulation/Gait                 Stairs            Wheelchair Mobility    Modified Rankin (Stroke Patients Only)       Balance Overall balance assessment: Needs assistance   Sitting balance-Leahy Scale: Poor Sitting balance - Comments: falling back and to L; able to lean forward with assist, but does not maintain; not  able to assist with UE's for balance Postural control: Posterior lean;Left lateral lean   Standing balance-Leahy Scale: Zero Standing balance comment: unable to stand without significant support; stood wtih +1 A while second person assisted with hygiene                    Cognition Arousal/Alertness: Lethargic Behavior During Therapy: Anxious Overall Cognitive Status: Impaired/Different from baseline Area of Impairment: Attention;Memory;Following commands;Safety/judgement;Awareness;Problem solving;Orientation Orientation Level: Disoriented to;Situation Current Attention Level: Focused Memory: Decreased short-term memory Following Commands: Follows one step commands with increased time Safety/Judgement: Decreased awareness of safety;Decreased awareness of deficits   Problem Solving: Slow processing General Comments: asleep initially, then when roused eager to get up, stated needed to have BM, but fearful of falling and increased leaning posteriorly and poor safety awareness    Exercises      General Comments        Pertinent Vitals/Pain Faces Pain Scale: Hurts a little bit Pain Location: abdomen Pain Descriptors / Indicators: Sore Pain Intervention(s): Monitored during session;Repositioned    Home Living                      Prior Function            PT Goals (current goals can now be found in the care plan section) Progress towards PT goals: Progressing toward goals    Frequency    Min 2X/week  PT Plan Current plan remains appropriate    Co-evaluation             End of Session Equipment Utilized During Treatment: Gait belt Activity Tolerance: Other (comment) (limited by fear) Patient left: in chair;with chair alarm set;with call bell/phone within reach;with nursing/sitter in room     Time: 1050-1128 PT Time Calculation (min) (ACUTE ONLY): 38 min  Charges:  $Therapeutic Activity: 38-52 mins                    G Codes:       Anne Black 02/03/2016, 4:00 PM  Anne Black, [place]PT [phone] 02/03/2016

## 2016-02-03 NOTE — Consult Note (Signed)
[facility] Consult   Reason for Consult:  Altered mental status Referring Physician:  Dr. Junie Panning [name] MRN:  [id number] Principal Diagnosis: Alcohol withdrawal [facility]) Diagnosis:   Patient Active Problem List   Diagnosis Date Noted  . Ascites [R18.8]   . Tachypnea [R06.82]   . Typical atrial flutter (HCC) [I48.3]   . Hypomagnesemia [E83.42]   . Hypoxia [R09.02]   . Dehydration [E86.0] 01/22/2016  . Low back ache [M54.5] 12/30/2015  . Non-intractable vomiting with nausea [R11.2]   . Alcohol use disorder ([place]) [F10.99] 12/15/2015  . Acute alcoholism ([place]) [F10.20]   . Abdominal distension [R14.0] 11/04/2015  . Alcohol abuse [F10.10]   . Dyspnea [R06.00]   . Acute respiratory failure ([place]) [J96.00] 10/15/2015  . Ascites due to alcoholic cirrhosis ([place]) [A07.62] 10/15/2015  . Osteoporosis [M81.0]   . Bipolar I disorder ([place]) [F31.9]   . Multiple sclerosis ([place]) [G35]   . Alcoholic cirrhosis of liver with ascites ([place]) [K70.31] 08/22/2015  . Bipolar disorder, current episode mixed, moderate ([place]) [F31.62] 08/01/2015  . Alcohol use disorder, severe, dependence ([place]) [F10.20] 07/31/2015  . Macrocytic anemia- due to alcohol abuse with normal B12 & folate levels [D53.9] 05/31/2015  . Severe protein-calorie malnutrition ([place]) [E43] 05/31/2015  . Hypokalemia [E87.6] 05/26/2015  . Encephalopathy, hepatic ([place]) [K72.90] 05/26/2015  . Alcohol withdrawal ([place]) [F10.239] 05/18/2015  . Abdominal pain [R10.9] 05/18/2015  . Anxiety [F41.9] 05/18/2015  . Sepsis ([place]) [A41.9] 05/18/2015  . UTI (lower urinary tract infection) [N39.0] 05/18/2015  . Stimulant abuse [F15.10] 12/27/2014  . Nicotine abuse [Z72.0] 12/27/2014  . Hyperprolactinemia ([place]) [E22.1] 11/15/2014  . Dyslipidemia [E78.5]   . Noncompliance with therapeutic plan [Z91.11] 04/04/2013  . Unspecified hereditary and idiopathic peripheral neuropathy [G60.9] 05/01/2012  . GERD  (gastroesophageal reflux disease) [K21.9] 05/01/2012  . Osteoarthrosis, unspecified whether generalized or localized, involving lower leg [M17.10] 05/01/2012    Total Time spent with patient: 45 minutes  Subjective/HPI:   Anne Black is a 55 y.o. female with alcohol use disorder, bipolar disorder per chart, alcoholic liver cirrhosis with ascites, anxiety, who is admitted for worsening generalized abdominal pain, vomiting, generalized weakness and nausea. She has been treated for hepatic cirrhosis with both hepatocellular dysfunction, severe hypokalemia. Hospital course is complicated by persistent altered mental status. Psychiatry is consulted for evaluation for her mental status.   Per nursing report, patient was trying to get out of the bed and was not coherent. Patient was placed on five points restraint.   Patient states that she was admitted to the hospital, as she was trying to be off from alcohol use. She drinks two cups of vodka every day. She wants to be removed of restraints. She states that she was "playing around" with nurse earlier, and regrets what she did. She misses her daughter and wants to see them (they just went outside of the room to have privacy for a patient). 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   She denies feeling depressed. She reports fair sleep. She has good appetite (but her daughter states that she had only a few bites). She reports passive SI, stating that she misses her daughters (who stepped out from the room a few minutes ago). She denies AH/VH. She denies paranoia. She denies drug use. She denies decreased need for sleep. She denies euphoria.  Her daughters are in the room.  Patient has occasional confusion; they report that the patient could not recognize their face yesterday. She used to be independent by her self, and they did not have  any concern about patient except she uses alcohol.   Past Psychiatric History:  Outpatient: used to see a psychiatrist (unable to tell when the last visit is) Dx: depression with mixed features Psychiatry admission: once in the past, details unknown Previous suicide attempt: denies Past trials of medication: risperidone, fluoxetine History of violence: denies  Risk to Self: Is patient at risk for suicide?: No Risk to Others:   Prior Inpatient Therapy:   Prior Outpatient Therapy:    Past Medical History:  Past Medical History:  Diagnosis Date  . ADHD (attention deficit hyperactivity disorder)   . Anxiety   . Arthritis   . Ascites   . Bipolar disorder ([place])   . Cirrhosis ([place])   . Hypokalemia   . Migraine   . Multiple sclerosis ([place])   . Osteoporosis     Past Surgical History:  Procedure Laterality Date  . CESAREAN SECTION  [id number]  . MYRINGOTOMY WITH TUBE PLACEMENT Bilateral   . TONSILLECTOMY     Family History:  Family History  Problem Relation Age of Onset  . Heart disease Mother   . Heart disease Father   . Hypertension Father    Family Psychiatric  History: denies Social History:  History  Alcohol Use  . 4.8 oz/week  . 2 Glasses of wine, 2 Cans of beer, 4 Standard drinks or equivalent per week    Comment: 10/2015   " I drink occasionaly "     History  Drug Use No    Social History   Social History  . Marital status: Divorced    Spouse name: n/a  . Number of children: 5  . Years of education: College   Occupational History  . disabled     on social security disability for MS   Social History Main Topics  . Smoking status: Current Every Day Smoker    Packs/day: 0.75    Years: 20.00    Types: Cigarettes  . Smokeless tobacco: Never Used     Comment: is cutting back, not in the car, house  . Alcohol use 4.8 oz/week    2 Glasses of wine, 2 Cans of beer, 4 Standard drinks or equivalent per week     Comment: 10/2015   " I drink occasionaly "  .  Drug use: No  . Sexual activity: Not Currently   Other Topics Concern  . None   Social History Narrative   Former Teaching laboratory technician.   Started graduate school in counseling, but didn't finish when a good job came open.   Lives alone.   One daughter lives in [place].   Another daughter is in [place].   The other three live in [place], along with her mother.   Father lives in the [place], [place] area.   Additional Social History:    Allergies:  No Known Allergies  Labs:  Results for orders placed or performed during the hospital encounter of 01/22/16 (from the past 48 hour(s))  CBC     Status: Abnormal   Collection Time: 02/03/16  4:44 AM  Result Value Ref Range   WBC 6.6 4.0 - 10.5 K/uL   RBC 3.60 (L) 3.87 - 5.11 MIL/uL   Hemoglobin 10.5 (L) 12.0 - 15.0 g/dL   HCT 33.0 (L) 36.0 - 46.0 %  MCV 91.7 78.0 - 100.0 fL   MCH 29.2 26.0 - 34.0 pg   MCHC 31.8 30.0 - 36.0 g/dL   RDW 18.1 (H) 11.5 - 15.5 %   Platelets 156 150 - 400 K/uL  Basic metabolic panel     Status: Abnormal   Collection Time: 02/03/16  4:44 AM  Result Value Ref Range   Sodium 135 135 - 145 mmol/L   Potassium 3.4 (L) 3.5 - 5.1 mmol/L   Chloride 101 101 - 111 mmol/L   CO2 28 22 - 32 mmol/L   Glucose, Bld 103 (H) 65 - 99 mg/dL   BUN <5 (L) 6 - 20 mg/dL   Creatinine, Ser 0.45 0.44 - 1.00 mg/dL   Calcium 8.3 (L) 8.9 - 10.3 mg/dL   GFR calc non Af Amer >60 >60 mL/min   GFR calc Af Amer >60 >60 mL/min    Comment: (NOTE) The eGFR has been calculated using the CKD EPI equation. This calculation has not been validated in all clinical situations. eGFR's persistently <60 mL/min signify possible Chronic Kidney Disease.    Anion gap 6 5 - 15    Current Facility-Administered Medications  Medication Dose Route Frequency Provider Last Rate Last Dose  . albuterol (PROVENTIL) (2.5 MG/3ML) 0.083% nebulizer solution 2.5 mg  2.5 mg Nebulization Q2H PRN Smiley Houseman, MD      . enoxaparin (LOVENOX)  injection 40 mg  40 mg Subcutaneous Q24H Zenia Resides, MD   40 mg at 02/03/16 1112  . feeding supplement (ENSURE ENLIVE) (ENSURE ENLIVE) liquid 237 mL  237 mL Oral BID BM Zenia Resides, MD      . folic acid (FOLVITE) tablet 1 mg  1 mg Oral Daily Smiley Houseman, MD   1 mg at 02/03/16 1109  . gabapentin (NEURONTIN) capsule 200 mg  200 mg Oral TID Blane Ohara McDiarmid, MD   200 mg at 02/03/16 1109  . lactulose (CHRONULAC) 10 GM/15ML solution 30 g  30 g Oral TID [facility], DO   30 g at 02/03/16 1108  . LORazepam (ATIVAN) injection 1 mg  1 mg Intravenous Q2H PRN [place] [place] [facility], DO   1 mg at 02/02/16 1350   Or  . LORazepam (ATIVAN) tablet 1 mg  1 mg Oral Q2H PRN [facility], DO   1 mg at 02/02/16 3016  . multivitamin with minerals tablet 1 tablet  1 tablet Oral Daily Smiley Houseman, MD   1 tablet at 02/03/16 1110  . nicotine (NICODERM CQ - dosed in mg/24 hours) patch 21 mg  21 mg Transdermal Daily Sela Hilding, MD   21 mg at 02/03/16 1112  . pantoprazole (PROTONIX) EC tablet 40 mg  40 mg Oral BID Eloise Levels, MD   40 mg at 02/03/16 1110  . rifaximin (XIFAXAN) tablet 550 mg  550 mg Oral BID Zenia Resides, MD   550 mg at 02/03/16 1109  . risperiDONE (RISPERDAL) tablet 0.5 mg  0.5 mg Oral QHS Smiley Houseman, MD   0.5 mg at 02/02/16 2244  . sodium chloride flush (NS) 0.9 % injection 3 mL  3 mL Intravenous Q12H Zenia Resides, MD   3 mL at 02/03/16 1000  . sodium chloride flush (NS) 0.9 % injection 3 mL  3 mL Intravenous PRN Zenia Resides, MD      . spironolactone (ALDACTONE) tablet 100 mg  100 mg Oral Daily Blane Ohara McDiarmid, MD   100 mg  at 02/03/16 1109  . sulfamethoxazole-trimethoprim (BACTRIM DS,SEPTRA DS) 800-160 MG per tablet 1 tablet  1 tablet Oral Daily Eloise Levels, MD   1 tablet at 02/03/16 1110  . thiamine (VITAMIN B-1) tablet 100 mg  100 mg Oral Daily Smiley Houseman, MD   100 mg at 02/03/16 1110    Musculoskeletal: Strength & Muscle Tone:  within normal limits Gait & Station: unable to assess, in restraints Patient leans: Backward  Psychiatric Specialty Exam: Physical Exam  Review of Systems  Psychiatric/Behavioral: Positive for substance abuse and suicidal ideas. Negative for depression and hallucinations. The patient is not nervous/anxious and does not have insomnia.   All other systems reviewed and are negative.   Blood pressure 115/62, pulse (!) 102, temperature 98.2 F (36.8 C), temperature source Oral, resp. rate (!) 25, height '5\' 2"'  (1.575 m), weight 167 lb (75.8 kg), SpO2 99 %.Body mass index is 30.54 kg/m.  General Appearance: Fairly Groomed  Eye Contact:  Good  Speech:  Clear and Coherent  Volume:  Normal  Mood:  "fine"  Affect:  Labile  Thought Process:  Coherent, disorganized at times  Orientation:  Other:  "2007, Dec, hospital, [place], [place]"   Perceptions: denies [place]  Suicidal Thoughts:  Yes.  without intent/plan  Homicidal Thoughts:  No  Memory:  Immediate;   Poor Recent;   Poor Remote;   Poor  Judgement:  Impaired  Insight:  Shallow  Psychomotor Activity:  Normal  Concentration:  Concentration: Fair and Attention Span: Fair  Recall:  [employer] of Knowledge:  Fair  Language:  Fair  Akathisia:  No  Handed:  Right  AIMS (if indicated):     Assets:  Desire for Improvement  ADL's:  Intact  Cognition:  Impaired,  Mild  Sleep:   fair  Delayed recall 0/3  Assessment Anne Black is a 55 y.o. female with alcohol use disorder, bipolar disorder per chart, alcoholic liver cirrhosis with ascites, anxiety, who is admitted for worsening generalized abdominal pain, vomiting, generalized weakness and nausea. She has been treated for hepatic cirrhosis with both hepatocellular dysfunction, severe hypokalemia. Hospital course is complicated by persistent altered mental status. Psychiatry is consulted for evaluation for her mental status.   # Delirium # r/o Wernicke encephalopathy The patient's exam is  notable for occasional disorganization and cognitive deficits that appear markedly different than their baseline, suggesting a diagnosis of delirium. Virtually any medical condition or physiologic stress can precipitate delirium in a susceptible individual, with risk increasing in those with: advanced age, prolonged hospitalizations, insomnia/disturbed sleep, hyper ammonia (resolving) and alcohol withdrawal. Addressing the underlying medical condition and institution of preventative measures are recommended. Will recommend high dose thiamine given patient is at risk for wernicke; her delayed recall was 0/3 which is significantly low given her education level (graduate school), although it is difficult to assess in the setting of  delirium. Would recommend prn risperidone for agitation.   - Start thiamine 500 mg IV TID for 3 days followed by 100 mg PO daily - Continue risperidone 0.5 mg qhs - Start risperidone 0.5 mg TID prn for agitation (Check EKG for QTc prolongation. Keep K>4.0, Mg>2 to avoid [place]) - Continue gabapentin 200 mg TID, CIWA protocol - Hold fluoxetine at this time. This medication can be restarted when her mental status improves. - Continue to monitor and treat underlying medical causes of delirium, including infection, electrolyte disturbances, etc. - Delirium precautions - Minimize/avoid deliriogenic meds including: anticholinergic, opiates, benzodiazepines           -  Maintain hydration, oxygenation, nutrition           - Limit use of restraints and catheters           - Normalize sleep patterns by minimizing nighttime noise, light and interruptions by                clustering care, opening blinds during the day           - Reorient the patient frequently, provide easily visible clock and calendar           - Provide sensory aids like glasses, hearing aids           - Encourage ambulation, regular activities and visitors to maintain cognitive stimulation   Treatment  Plan Summary: Plan as above  Disposition: No evidence of imminent risk to self or others at present.    Thank you for your consult. We will sign off. Please contact psychiatry for any questions or concerns.  Norman Clay, MD 02/03/2016 5:34 PM

## 2016-02-03 NOTE — Progress Notes (Signed)
Initial Nutrition Assessment  DOCUMENTATION CODES:   Not applicable  INTERVENTION:    Ensure Enlive PO BID, each supplement provides 350 kcal and 20 grams of protein  NUTRITION DIAGNOSIS:   Inadequate oral intake related to poor appetite as evidenced by per patient/family report.  GOAL:   Patient will meet greater than or equal to 90% of their needs  MONITOR:   PO intake, Supplement acceptance, Labs, Skin, I & O's  REASON FOR ASSESSMENT:   Consult Assessment of nutrition requirement/status  ASSESSMENT:   55 y.o. female presenting with dehydration, generalized pain, ascites. PMH is significant for Alcoholic Liver Cirrhosis with Ascites, Alcohol Abuse, Bipolar DO, anxiety, GERD.   Patient unable to provide a clear nutrition history. She reported that she usually weighs 120 lbs, and that she usually weighs 160 lbs. Current weight is likely elevated due to fluid overload. She says that she usually eats "average" and consumes 2 meals per day. Lately she has had a poor appetite and has been eating poorly. Patient may have lost weight with recent poor intake, however it is being masked by fluid overload. She agreed to drink Ensure between meals. Per nurse tech, she ate 0% of breakfast today.  Labs reviewed: potassium slightly low. Medications reviewed and include MVI, folic acid, thiamine, lactulose, spironolactone.  Diet Order:  Diet Heart Room service appropriate? Yes; Fluid consistency: Thin  Skin:  Reviewed, no issues  Last BM:  12/25  Height:   Ht Readings from Last 1 Encounters:  01/26/16 5\' 2"  (1.575 m)    Weight:   Wt Readings from Last 1 Encounters:  02/03/16 167 lb (75.8 kg)    Ideal Body Weight:  50 kg  BMI:  Body mass index is 30.54 kg/m.  Estimated Nutritional Needs:   Kcal:  1600-1800  Protein:  70-80 gm  Fluid:  1.6 L  EDUCATION NEEDS:   No education needs identified at this time  Joaquin CourtsKimberly Harris, RD, LDN, CNSC Pager [phone] After  Hours Pager [phone]

## 2016-02-04 LAB — COMPREHENSIVE METABOLIC PANEL
ALBUMIN: 2.1 g/dL — AB (ref 3.5–5.0)
ALT: 17 U/L (ref 14–54)
ANION GAP: 12 (ref 5–15)
AST: 46 U/L — ABNORMAL HIGH (ref 15–41)
Alkaline Phosphatase: 138 U/L — ABNORMAL HIGH (ref 38–126)
BUN: <5 mg/dL — ABNORMAL LOW (ref 6–20)
CHLORIDE: 99 mmol/L — AB (ref 101–111)
CO2: 24 mmol/L (ref 22–32)
Calcium: 8.2 mg/dL — ABNORMAL LOW (ref 8.9–10.3)
Creatinine, Ser: 0.55 mg/dL (ref 0.44–1.00)
GFR calc non Af Amer: >60 mL/min (ref >60)
GLUCOSE: 98 mg/dL (ref 65–99)
POTASSIUM: 3.4 mmol/L — AB (ref 3.5–5.1)
SODIUM: 135 mmol/L (ref 135–145)
Total Bilirubin: 1.8 mg/dL — ABNORMAL HIGH (ref 0.3–1.2)
Total Protein: 5.5 g/dL — ABNORMAL LOW (ref 6.5–8.1)

## 2016-02-04 LAB — MAGNESIUM
Magnesium: 1.6 mg/dL — ABNORMAL LOW (ref 1.7–2.4)
Magnesium: 1.7 mg/dL (ref 1.7–2.4)

## 2016-02-04 MED ORDER — MAGNESIUM SULFATE 2 GM/50ML IV SOLN
2.0000 g | Freq: Once | INTRAVENOUS | Status: AC
  Administered 2016-02-04: 2 g via INTRAVENOUS
  Filled 2016-02-04: qty 50

## 2016-02-04 MED ORDER — CLOTRIMAZOLE 1 % EX CREA
TOPICAL_CREAM | Freq: Two times a day (BID) | CUTANEOUS | Status: DC
  Administered 2016-02-04 – 2016-02-06 (×5): via TOPICAL
  Filled 2016-02-04: qty 15

## 2016-02-04 MED ORDER — IPRATROPIUM-ALBUTEROL 0.5-2.5 (3) MG/3ML IN SOLN
3.0000 mL | RESPIRATORY_TRACT | Status: DC
  Administered 2016-02-04 (×3): 3 mL via RESPIRATORY_TRACT
  Filled 2016-02-04 (×3): qty 3

## 2016-02-04 MED ORDER — RISPERIDONE 0.5 MG PO TABS
0.5000 mg | ORAL_TABLET | Freq: Three times a day (TID) | ORAL | Status: DC | PRN
  Administered 2016-02-05 (×2): 0.5 mg via ORAL
  Filled 2016-02-04 (×3): qty 1

## 2016-02-04 NOTE — Progress Notes (Signed)
I have interviewed Anne Black and her mother, Anne Black, at bedside.  I examined the patient.  I have discussed the case and verified the key findings with Dr. Chanetta Marshallimberlake.   I agree with their assessments and plans as documented in their visit note.   Anne Black was orient to hospital, year, and month.  She was not oriented to day or date.  She was able to describe the reason for her hospitalization.  She was able to attend to me in a directed conversation for about 10 minutes, with occasional digressions to the indignity of having restraints placed on her.   I discussed with Anne Black and her mother what their healthcare goals for Anne Black were.    Anne Black goal is to return home after physical rehabilitation and an intensive inpatient alcohol & substance rehabilitation program.  Her mother identified a similar goal for her daughter.    Anne Black did not believe that the usual restrictions on payments for SNF applied to her daughter because her primary insurance is [employer].  Medicare is a Social research officer, governmentsecondary insurance for patient.   Her mother has been in contact with an [employer] scientist[employer] insurance nurse who Anne Black reports said that Anne Black's SNF would be covered by [employer] if it were ordered.  In discussing Anne Black's end of life issues with her and her mother, it was my opinion that if Anne Black continued to drink, it would not surprise me if she died within the next six months.  Alternatively, should she remain abstinent, she may have a life expectancy beyond six months.  Evidence for limited life expectancy is elevated INR and serum albumin less than 2.3 g/dL with hepatic encephalopathy and ascites in a patient who is non-adherent to recommended treatments.   Anne Black was able to repeat to me what I said about her goals and life expectancies with and without abstinence.   A/ Alcoholic Liver Cirrhosis- Stage C Delirium, improving Alcohol withdrawal, improving.  Alcohol Dependence, continuous Bipolar  Disorder Impaired ADLs, gait, balance, strength and endurance  P/ Pursue SNF placement thru [employer] insurance as primary.  Patient's mother can work with [employer] to find a Alcohol and substance dependence rehabilitation facility to accept patient after SNF.       Ambulate three times a day to decrease risk of delirium      Stop CIWA      Haldol 0.5 mg prn risk harm to self or others, hallucinnations/delusions that are disturbing to           patient/refusal to allow life and limb saving treatments.            Use Haldol for.

## 2016-02-04 NOTE — Progress Notes (Signed)
Physical Therapy Treatment Patient Details Name: Anne Black MRN: [id number] DOB: 10/26/1960 Today's Date: 02/04/2016    History of Present Illness 55 y.o. female presenting with dehydration, generalized pain, ascites. PMH is significant for Alcoholic Liver Cirrhosis with Ascites, Alcohol Abuse, Bipolar DO, anxiety, GERD    PT Comments    Patient seen for mobility progression, tolerated EOB activity with assist (poor trunk control). Performed sit <> stand x5 with weight shifts and pre gait mini squats using Bilateral UE support on back of chair. Patient tolerated well but limited overall by fatigue. Will continue to see and progress as tolerated.  Follow Up Recommendations  SNF;Supervision/Assistance - 24 hour     Equipment Recommendations  None recommended by PT    Recommendations for Other Services       Precautions / Restrictions Precautions Precautions: Fall Restrictions Weight Bearing Restrictions: No    Mobility  Bed Mobility Overal bed mobility: Needs Assistance Bed Mobility: Supine to Sit;Sit to Supine     Supine to sit: Mod assist Sit to supine: Max assist   General bed mobility comments: increased time and effort to perform  Transfers Overall transfer level: Needs assistance Equipment used:  (Utilized UE support via back of chair) Transfers: Sit to/from Stand Sit to Stand: Mod assist;+2 physical assistance         General transfer comment: Sit to stand x5 perfromed from elevated EOB. Mod assist +2 for balance with strong posterior lean  Ambulation/Gait                 Stairs            Wheelchair Mobility    Modified Rankin (Stroke Patients Only)       Balance Overall balance assessment: Needs assistance Sitting-balance support: Feet supported;No upper extremity supported Sitting balance-Leahy Scale: Poor Sitting balance - Comments: strong posterior lean requiring physical assist to maintain balance Postural control:  Posterior lean Standing balance support: Bilateral upper extremity supported Standing balance-Leahy Scale: Poor Standing balance comment: posterior lean in standing with bil UE support and external assist                    Cognition Arousal/Alertness: Awake/alert Behavior During Therapy: Flat affect Overall Cognitive Status: Impaired/Different from baseline Area of Impairment: Attention;Memory;Following commands;Safety/judgement;Problem solving   Current Attention Level: Focused Memory: Decreased short-term memory Following Commands: Follows one step commands inconsistently Safety/Judgement: Decreased awareness of safety;Decreased awareness of deficits   Problem Solving: Slow processing;Difficulty sequencing;Decreased initiation;Requires verbal cues;Requires tactile cues General Comments: Easily distracted and requires consistent cues for attention to task and safety.    Exercises      General Comments        Pertinent Vitals/Pain Pain Assessment: Faces Faces Pain Scale: Hurts a little bit    Home Living                      Prior Function            PT Goals (current goals can now be found in the care plan section) Acute Rehab PT Goals Patient Stated Goal: get a drink of water PT Goal Formulation: With patient Time For Goal Achievement: 02/08/16 Potential to Achieve Goals: Fair Progress towards PT goals: Progressing toward goals    Frequency    Min 2X/week      PT Plan Current plan remains appropriate    Co-evaluation PT/OT/SLP Co-Evaluation/Treatment: Yes Reason for Co-Treatment: Complexity of the patient's impairments (multi-system involvement);For patient/therapist  safety;Necessary to address cognition/behavior during functional activity PT goals addressed during session: Mobility/safety with mobility OT goals addressed during session: ADL's and self-care     End of Session Equipment Utilized During Treatment: Gait belt Activity  Tolerance: Patient limited by fatigue Patient left: in bed     Time: 5035-4656 PT Time Calculation (min) (ACUTE ONLY): 23 min  Charges:  $Therapeutic Activity: 8-22 mins                    G Codes:      Fabio Asa 02/27/2016, 4:31 PM Charlotte Crumb, PT DPT  [phone]

## 2016-02-04 NOTE — Progress Notes (Signed)
Occupational Therapy Treatment Patient Details Name: Anne Black MRN: [id number] DOB: 26-Jan-1961 Today's Date: 02/04/2016    History of present illness 55 y.o. female presenting with dehydration, generalized pain, ascites. PMH is significant for Alcoholic Liver Cirrhosis with Ascites, Alcohol Abuse, Bipolar DO, anxiety, GERD   OT comments  Pt tolerated sit to stand from EOB x5 with mod assist +2 for balance. Pt with strong posterior lean in standing and sitting. Min assist for grooming task sitting EOB due to poor sitting balance. Pt c/o dizziness following activity; VSS. D/c plan remains appropriate. Will continue to follow acutely.   Follow Up Recommendations  SNF    Equipment Recommendations  None recommended by OT    Recommendations for Other Services      Precautions / Restrictions Precautions Precautions: Fall Restrictions Weight Bearing Restrictions: No       Mobility Bed Mobility Overal bed mobility: Needs Assistance Bed Mobility: Supine to Sit;Sit to Supine     Supine to sit: Mod assist Sit to supine: Max assist      Transfers Overall transfer level: Needs assistance   Transfers: Sit to/from Stand Sit to Stand: Mod assist;+2 physical assistance         General transfer comment: Sit to stand x5 perfromed from elevated EOB. Mod assist +2 for balance with strong posterior lean    Balance Overall balance assessment: Needs assistance Sitting-balance support: Feet supported;No upper extremity supported Sitting balance-Leahy Scale: Poor Sitting balance - Comments: strong posterior lean requiring physical assist to maintain balance Postural control: Posterior lean Standing balance support: Bilateral upper extremity supported Standing balance-Leahy Scale: Poor Standing balance comment: posterior lean in standing with bil UE support and external assist                   ADL Overall ADL's : Needs assistance/impaired     Grooming: Minimal  assistance;Sitting;Brushing hair Grooming Details (indicate cue type and reason): assist for balance sitting EOB                             Functional mobility during ADLs: Moderate assistance;+2 for physical assistance (for sit to stand x5) General ADL Comments: Max cues for attention to task and sequencing; pt easily distracted.      Vision                     Perception     Praxis      Cognition   Behavior During Therapy: Flat affect Overall Cognitive Status: Impaired/Different from baseline Area of Impairment: Attention;Memory;Following commands;Safety/judgement;Problem solving   Current Attention Level: Focused Memory: Decreased short-term memory  Following Commands: Follows one step commands inconsistently Safety/Judgement: Decreased awareness of safety;Decreased awareness of deficits   Problem Solving: Slow processing;Difficulty sequencing;Decreased initiation;Requires verbal cues;Requires tactile cues General Comments: Easily distracted and requires consistent cues for attention to task and safety.    Extremity/Trunk Assessment               Exercises     Shoulder Instructions       General Comments      Pertinent Vitals/ Pain       Pain Assessment: Faces Faces Pain Scale: Hurts a little bit  Home Living  Prior Functioning/Environment              Frequency  Min 2X/week        Progress Toward Goals  OT Goals(current goals can now be found in the care plan section)  Progress towards OT goals: Progressing toward goals  Acute Rehab OT Goals Patient Stated Goal: get a drink of water OT Goal Formulation: With patient  Plan Discharge plan remains appropriate    Co-evaluation    PT/OT/SLP Co-Evaluation/Treatment: Yes Reason for Co-Treatment: Complexity of the patient's impairments (multi-system involvement);For patient/therapist safety;Necessary to address  cognition/behavior during functional activity PT goals addressed during session: Mobility/safety with mobility OT goals addressed during session: ADL's and self-care      End of Session Equipment Utilized During Treatment: Gait belt   Activity Tolerance Patient tolerated treatment well   Patient Left in bed;with call bell/phone within reach;with nursing/sitter in room;with family/visitor present   Nurse Communication Mobility status        Time: [id number] OT Time Calculation (min): 23 min  Charges: OT General Charges $OT Visit: 1 Procedure OT Treatments $Self Care/Home Management : 8-22 mins  Gaye AlkenBailey A Indie Boehne M.S., OTR/L Pager: [phone]  02/04/2016, 4:15 PM

## 2016-02-04 NOTE — Progress Notes (Signed)
Family Medicine Teaching Service Daily Progress Note Intern Pager: [phone]  Patient name: Anne Black Medical record number: [id number] Date of birth: 12/04/1960 Age: 55 y.o. Gender: female  Primary Care Provider: [name], MD Consultants: None Code Status: Full Code  Assessment and Plan: 55 y.o. female presenting with dehydration, generalized pain, ascites. PMH is significant for Alcoholic Liver Cirrhosis with Ascites, Alcohol Abuse, Bipolar DO, anxiety, GERD.   #Alcoholic Cirrhosis with Ascites, resolving. CIWA 0>6>6>4 --Continue Bactrim 800-160 mg 1 tab daily (start 12/21) for SBP prophylaxis --Discontinue CIWA scoring and PRN ativan, as patient is outside EtOH withdrawal window, confusion and agitation likely secondary to delirium --Consider as needed Haldol for agitation. Note QT 480. --Continue Spironolactone 100 mg --Continue Rifaximin 550 mg bid --Continue Lactulose 200 g q8. Has been more consistent with doses for two days now.  --Continue Thiamine and Folic Acid --PT/OT-- PT>>SNF and 24hr supervision  --Social Work --Psych consulted to help differentiate Withdrawal vs. Encephalopathy vs. Underlying worsening psychiatric disorder. Appreciate recommendations. Recommended risperdone PRN TID for anxiety --Consider establishing care with GI in [facility] prior to discharge --Transfer out of SDU -- consulted palliative care after discussion with [name], daughter. Explained that patient meets hospice criteria for end stage liver disease and this may provide additional resources. [name] was amenable to consult, understanding that this does not mean anyone will change GOC without discussion with patient and family.  #Bipolar Disorder and Anxiety. Prozac 20mg  daily, Neurontin 400mg  TID, Risperdal 0.5mg , and Topamax on med list but uncertain if patient is taking or should be taking. Recently discharged from [facility] (01/08/16).  --Continue Gabapentin 400mg   TID --Continue Risperdal 0.5mg  qhs --Hold Prozac 20 mg daily for now due to QT prolongation  --Continue Sitter --Psych consulted, appreciate recommendations: hold fluoxitine and consider risperidone TID PRN if QTc will allow  #Volume depletion, ongoing. Continues to have poor PO intake. Has been fine balance between low volume status Intravascularly and elevated fluid status Extravascularly requiring lasix.  --Nutrition consulted --Boost --Encourage PO intake.   #History of Prolonged QTC: QTc [phone] --Avoid QT prolonging medications   FEN/GI: Heart Healthy Prophylaxis: Lovenox, Protonix  Disposition: Discharge pending medical improvement  Subjective:  Reports she wants to go to EtOH rehab today. Understands that we need to optimize her medical needs first. AOx3.   Objective: Temp:  [97.6 F (36.4 C)-98.3 F (36.8 C)] 98 F (36.7 C) (12/27 0240) Pulse Rate:  [88-102] 95 (12/27 0240) Resp:  [21-26] 23 (12/27 0240) BP: (94-115)/(51-74) 102/52 (12/27 0240) SpO2:  [90 %-99 %] 90 % (12/27 0240) Weight:  [165 lb (74.8 kg)] 165 lb (74.8 kg) (12/27 0500)  Physical Exam: General: 55 year old female lying in bed in NAD Cardiovascular: RRR, intact distal pulses Respiratory: no increased work of breathing noted, [place] in place, mild wheezing diffusely Gastrointestinal:Soft and moderately distended with some ascites present Derm: warm and dry Neuro: Alert and Oriented x3 Psych: Speech is still slow but improving, mood appropriate  Laboratory:  Recent Labs Lab 01/30/16 0651 02/01/16 0416 02/03/16 0444  WBC 6.9 5.7 6.6  HGB 10.0* 9.6* 10.5*  HCT 31.7* 30.3* 33.0*  PLT 156 157 156    Recent Labs Lab 02/01/16 0416 02/03/16 0444 02/04/16 0440  NA 135 135 135  K 3.7 3.4* 3.4*  CL 103 101 99*  CO2 27 28 24   BUN <5* <5* <5*  CREATININE 0.49 0.45 0.55  CALCIUM 7.9* 8.3* 8.2*  PROT  --   --  5.5*  BILITOT  --   --  1.8*  ALKPHOS  --   --  138*  ALT  --   --  17  AST   --   --  46*  GLUCOSE 100* 103* 98   - LA 1.1 - LDH 098>119>147228>288>200  Garth BignessKathryn Timberlake, MD 02/04/2016, 7:22 AM PGY-1, [facility] FPTS Intern pager: [phone], text pages welcome

## 2016-02-05 DIAGNOSIS — K709 Alcoholic liver disease, unspecified: Secondary | ICD-10-CM

## 2016-02-05 DIAGNOSIS — Z7189 Other specified counseling: Secondary | ICD-10-CM

## 2016-02-05 DIAGNOSIS — K7290 Hepatic failure, unspecified without coma: Secondary | ICD-10-CM

## 2016-02-05 DIAGNOSIS — Z515 Encounter for palliative care: Secondary | ICD-10-CM

## 2016-02-05 LAB — BASIC METABOLIC PANEL
Anion gap: 5 (ref 5–15)
CHLORIDE: 99 mmol/L — AB (ref 101–111)
CO2: 27 mmol/L (ref 22–32)
Calcium: 7.7 mg/dL — ABNORMAL LOW (ref 8.9–10.3)
Creatinine, Ser: 0.48 mg/dL (ref 0.44–1.00)
GFR calc Af Amer: >60 mL/min (ref >60)
GFR calc non Af Amer: >60 mL/min (ref >60)
GLUCOSE: 100 mg/dL — AB (ref 65–99)
POTASSIUM: 3.3 mmol/L — AB (ref 3.5–5.1)
Sodium: 131 mmol/L — ABNORMAL LOW (ref 135–145)

## 2016-02-05 LAB — CBC
HEMATOCRIT: 31.2 % — AB (ref 36.0–46.0)
Hemoglobin: 10.1 g/dL — ABNORMAL LOW (ref 12.0–15.0)
MCH: 28.6 pg (ref 26.0–34.0)
MCHC: 32.4 g/dL (ref 30.0–36.0)
MCV: 88.4 fL (ref 78.0–100.0)
Platelets: 149 10*3/uL — ABNORMAL LOW (ref 150–400)
RBC: 3.53 MIL/uL — ABNORMAL LOW (ref 3.87–5.11)
RDW: 18.6 % — AB (ref 11.5–15.5)
WBC: 6.9 10*3/uL (ref 4.0–10.5)

## 2016-02-05 LAB — CULTURE, BODY FLUID W GRAM STAIN -BOTTLE

## 2016-02-05 LAB — CULTURE, BODY FLUID-BOTTLE: CULTURE: NO GROWTH

## 2016-02-05 MED ORDER — PROMETHAZINE HCL 25 MG PO TABS
12.5000 mg | ORAL_TABLET | Freq: Once | ORAL | Status: AC
  Administered 2016-02-05: 12.5 mg via ORAL
  Filled 2016-02-05: qty 1

## 2016-02-05 MED ORDER — KETOROLAC TROMETHAMINE 15 MG/ML IJ SOLN
15.0000 mg | Freq: Three times a day (TID) | INTRAMUSCULAR | Status: DC | PRN
Start: 2016-02-05 — End: 2016-02-06
  Administered 2016-02-05 – 2016-02-06 (×2): 15 mg via INTRAVENOUS
  Filled 2016-02-05 (×2): qty 1

## 2016-02-05 MED ORDER — POTASSIUM CHLORIDE CRYS ER 20 MEQ PO TBCR
20.0000 meq | EXTENDED_RELEASE_TABLET | Freq: Two times a day (BID) | ORAL | Status: DC
  Administered 2016-02-05 – 2016-02-06 (×3): 20 meq via ORAL
  Filled 2016-02-05 (×4): qty 1

## 2016-02-05 NOTE — Progress Notes (Signed)
Nutrition Follow-up  DOCUMENTATION CODES:   Not applicable  INTERVENTION:   -Continue Ensure Enlive po BID, each supplement provides 350 kcal and 20 grams of protein  NUTRITION DIAGNOSIS:   Inadequate oral intake related to poor appetite as evidenced by per patient/family report.  Progressing  GOAL:   Patient will meet greater than or equal to 90% of their needs  Progressing  MONITOR:   PO intake, Supplement acceptance, Labs, Skin, I & O's  REASON FOR ASSESSMENT:   Consult Assessment of nutrition requirement/status  ASSESSMENT:   55 y.o. female presenting with dehydration, generalized pain, ascites. PMH is significant for Alcoholic Liver Cirrhosis with Ascites, Alcohol Abuse, Bipolar DO, anxiety, GERD.   Pt receiving nursing care at time of visit.   Per doc flowesheets, oral intake is improving. Noted 0-100% meal completion over the past 2 days, averaging 25-50%. Pt has been accepting Ensure supplements, however, accepting this morning's dose.   Palliative care consulted due to end stage liver disease. Per family medicine notes, pt desires to undergo alcohol rehab and SNF placement.   Labs reviewed: Na: 131, K: 3.3 (on PO supplementation).   Diet Order:  Diet Heart Room service appropriate? Yes; Fluid consistency: Thin  Skin:  Reviewed, no issues  Last BM:  02/05/16  Height:   Ht Readings from Last 1 Encounters:  01/26/16 5\' 2"  (1.575 m)    Weight:   Wt Readings from Last 1 Encounters:  02/05/16 161 lb (73 kg)    Ideal Body Weight:  50 kg  BMI:  Body mass index is 29.45 kg/m.  Estimated Nutritional Needs:   Kcal:  1600-1800  Protein:  70-80 gm  Fluid:  1.6 L  EDUCATION NEEDS:   No education needs identified at this time  Keyshawn Hellwig A. Mayford KnifeWilliams, RD, LDN, CDE Pager: [phone] After hours Pager: [phone]

## 2016-02-05 NOTE — Progress Notes (Signed)
Benefit check sent for xifaxan

## 2016-02-05 NOTE — Progress Notes (Signed)
Family Medicine Teaching Service Daily Progress Note Intern Pager: [phone]  Patient name: Anne Black Medical record number: [id number] Date of birth: 21-May-1960 Age: 55 y.o. Gender: female  Primary Care Provider: [name], MD Consultants: None Code Status: Full Code  Assessment and Plan: 55 y.o. female presenting with dehydration, generalized pain, ascites. PMH is significant for Alcoholic Liver Cirrhosis with Ascites, Alcohol Abuse, Bipolar DO, anxiety, GERD.   #Alcoholic Cirrhosis with Ascites, persistent: Discontinued CIWA scoring and discontinued scheduled ativan yesterday.  Marked abdominal distention this AM.  Lungs clear, satting well on room air.  --Continue Bactrim 800-160 mg 1 tab daily (start 12/21) for SBP prophylaxis --Discontinue CIWA scoring and PRN ativan, as patient is outside EtOH withdrawal window, confusion and agitation likely secondary to delirium --Consider as needed Haldol for agitation. Note QT 480 --Continue Spironolactone 100 mg, Rifaximin 550 mg bid, Lactulose 200 g q8.  --Continue Thiamine and Folic Acid --PT/OT-- PT>>SNF and 24hr supervision  --Social Work  --Psych consulted to help differentiate Withdrawal vs. Encephalopathy vs. Underlying worsening psychiatric disorder. Appreciate recommendations. Recommended risperdone PRN TID for anxiety --Consider establishing care with GI in [facility] prior to discharge -- ambulate 3x daily to decrease risk of delirium -- consulted palliative care   #Bipolar Disorder and Anxiety. Prozac 20mg  daily, Neurontin 400mg  TID, Risperdal 0.5mg , and Topamax on med list but uncertain if patient is taking or should be taking. Recently discharged from [facility] (01/08/16).  --Continue Gabapentin 400mg  TID --Continue Risperdal 0.5mg  qhs --Hold Prozac 20 mg daily for now due to QT prolongation  --Continue Sitter --Psych consulted, appreciate recommendations: hold fluoxitine and consider risperidone  TID PRN if QTc will allow  #Volume depletion, ongoing. Continues to have poor PO intake. Has been fine balance between low volume status Intravascularly and elevated fluid status Extravascularly requiring lasix.  --Nutrition consulted --Boost --Encourage PO intake.   #History of Prolonged QTC: QTc [phone] --Avoid QT prolonging medications   FEN/GI: Heart Healthy Prophylaxis: Lovenox, Protonix  Disposition: Discharge pending medical improvement  Subjective:  Denies any acute issues this AM. Denies any CP, SOB, NVD.   Objective: Temp:  [97.9 F (36.6 C)-98.4 F (36.9 C)] 97.9 F (36.6 C) (12/28 0626) Pulse Rate:  [60-101] 96 (12/28 0626) Resp:  [18-20] 18 (12/28 0626) BP: (96-129)/(44-84) 96/50 (12/28 0626) SpO2:  [89 %-100 %] 92 % (12/28 0626) Weight:  [161 lb (73 kg)] 161 lb (73 kg) (12/28 0500)  Physical Exam: General: 55 year old female lying in bed in NAD Cardiovascular: RRR, intact distal pulses Respiratory: no increased work of breathing noted, [place] in place, NWOB Gastrointestinal:Soft and moderately distended with marked ascities Derm: warm and dry Neuro: Alert and Oriented x3 Psych: Speech is still slow but improving, mood appropriate  Laboratory:  Recent Labs Lab 02/01/16 0416 02/03/16 0444 02/05/16 0450  WBC 5.7 6.6 6.9  HGB 9.6* 10.5* 10.1*  HCT 30.3* 33.0* 31.2*  PLT 157 156 149*    Recent Labs Lab 02/03/16 0444 02/04/16 0440 02/05/16 0450  NA 135 135 131*  K 3.4* 3.4* 3.3*  CL 101 99* 99*  CO2 28 24 27   BUN <5* <5* <5*  CREATININE 0.45 0.55 0.48  CALCIUM 8.3* 8.2* 7.7*  PROT  --  5.5*  --   BILITOT  --  1.8*  --   ALKPHOS  --  138*  --   ALT  --  17  --   AST  --  46*  --   GLUCOSE 103* 98 100*   -  LA 1.1 - LDH 161>096>045228>288>200  Renne Muscaaniel L Jamaris Biernat, MD 02/05/2016, 8:13 AM PGY-1, [facility] FPTS Intern pager: [phone], text pages welcome

## 2016-02-05 NOTE — Consult Note (Addendum)
Consultation Note Date: 02/05/2016   Patient Name: Anne Black  DOB: [birth year]  MRN: [id number]  Age / Sex: 55 y.o., female  PCP: Smiley Houseman, MD Referring Physician: Zenia Resides, MD  Reason for Consultation: Establishing goals of care, Hospice Evaluation and Psychosocial/spiritual support  HPI/Patient Profile: 55 y.o. female  with past medical history of alcoholic cirrhosis, portal hypertension, bipolar disorder, anxiety, and multiple sclerosis who was admitted on 01/22/2016 with abdominal pain, weakness and confusion.  She was found to have atrial flutter on admission.  She has be able to physically detox from alcohol, but has been battling encephalopathy during her admission.  She has had 5 admissions in 5 months.  On admission her potassium was less than 2 and LFTs were elevated with a total bili of 3.6.  Her ammonia level was 84.  She has coagulopathy with an INR of 1.5.  Her albumin is low - currently 2.1 and she has undergone paracentesis 2x this admission with re accumulation of her ascites..  Clinical Assessment and Goals of Care: I met with the patient at bedside.  After talking with her for a few minutes one realizes she is confused and emotionally labile.  I listened to the patient for a long time and attempted to provide emotional support.  She states she is will to go to SNF then ETOH rehab, but shortly after saying that she asks me to drive her home today.    We discussed HCPOA.  She indicated she would want her ex-husband, Joneen Caraway to be her HCPOA.  She tells me she is very concerned about who will care for her 56 yo twins when she is not around.  She states she needs emotional support from her family but they expect her to do things on her own.  I also spoke with [name] (mother) for a long time (approx 40 min).  It was evident that [name] and the family attempt to provide a  great deal of support to the patient and have her best interest at heart.  [name] stated she would greatly appreciate it if we could complete a Living Will with her daughter and establish and HCPOA.  Primary Decision Maker:  OTHER - undetermined - will attempt to determine HCPOA in family meeting 12/29 at 11:00    [place]    PMT will meet with patient and family tomorrow to attempt to complete HCPOA and Living Will.  Code Status/Advance Care Planning:  Full code    Symptom Management:   Per primary team.  Palliative Prophylaxis:   Delirium Protocol   Psycho-social/Spiritual:   Desire for further Chaplaincy support:yes  Additional Recommendations: Crisis Intervention - complicated case of alcohol addiction and psychiatric illness.  Prognosis:   < 6 months given advanced liver disease:  INR = 1.5, serum albumin 2.1, refractory ascites  Discharge Planning: [place] for rehab with Palliative care service follow-up      Primary Diagnoses: Present on Admission: . Hypokalemia . Dehydration . Hypomagnesemia .  Ascites . Alcohol withdrawal ([place]) . Ascites due to alcoholic cirrhosis ([place]) . Bipolar I disorder ([place]) . Encephalopathy, hepatic ([place])   I have reviewed the medical record, interviewed the patient and family, and examined the patient. The following aspects are pertinent.  Past Medical History:  Diagnosis Date  . ADHD (attention deficit hyperactivity disorder)   . Anxiety   . Arthritis   . Ascites   . Bipolar disorder ([place])   . Cirrhosis ([place])   . Hypokalemia   . Migraine   . Multiple sclerosis ([employer])   . Osteoporosis    Social History   Social History  . Marital status: Divorced    Spouse name: n/a  . Number of children: 5  . Years of education: College   Occupational History  . disabled     on social security disability for MS   Social History Main Topics  . Smoking status: Current Every Day Smoker      Packs/day: 0.75    Years: 20.00    Types: Cigarettes  . Smokeless tobacco: Never Used     Comment: is cutting back, not in the car, house  . Alcohol use 4.8 oz/week    2 Glasses of wine, 2 Cans of beer, 4 Standard drinks or equivalent per week     Comment: 10/2015   " I drink occasionaly "  . Drug use: No  . Sexual activity: Not Currently   Other Topics Concern  . None   Social History Narrative   Former Teaching laboratory technician.   Started graduate school in counseling, but didn't finish when a good job came open.   Lives alone.   One daughter lives in [place].   Another daughter is in [place].   The other three live in [place], along with her mother.   Father lives in the [place], [place] area.   Family History  Problem Relation Age of Onset  . Heart disease Mother   . Heart disease Father   . Hypertension Father    Scheduled Meds: . clotrimazole   Topical BID  . enoxaparin (LOVENOX) injection  40 mg Subcutaneous Q24H  . feeding supplement (ENSURE ENLIVE)  237 mL Oral BID BM  . folic acid  1 mg Oral Daily  . gabapentin  200 mg Oral TID  . lactulose  30 g Oral TID  . multivitamin with minerals  1 tablet Oral Daily  . nicotine  21 mg Transdermal Daily  . pantoprazole  40 mg Oral BID  . potassium chloride  20 mEq Oral BID  . rifaximin  550 mg Oral BID  . risperiDONE  0.5 mg Oral QHS  . sodium chloride flush  3 mL Intravenous Q12H  . spironolactone  100 mg Oral Daily  . sulfamethoxazole-trimethoprim  1 tablet Oral Daily  . thiamine injection  500 mg Intravenous TID   Continuous Infusions: PRN Meds:.albuterol, ketorolac, risperiDONE, sodium chloride flush No Known Allergies Review of Systems  Complains of diarrhea x 4 episodes today. +Dizzyness +weakness +sadness, depression +abdominal pain +abdominal swelling +anorexia Denies chest pain   Physical Exam  Well developed chronically ill appearing female, NAD.  Awake, pallor [place]/AT CV rrr,  +systolic murmur resp +expiratory wheeze, mildly increased work of breathing Abdomen soft, nt, +distention. Psychiatric poor insight, orientated to person but not to place or time (asks me to drive her home now to pick up a few things) Emotionally labile - quickly begins crying. Neuro awake, follows command, clear speech   Vital Signs:  BP (!) 118/54 (BP Location: Left Arm)   Pulse 95   Temp 98.6 F (37 C) (Oral)   Resp 20   Ht '5\' 2"'  (1.575 m)   Wt 73 kg (161 lb)   SpO2 93%   BMI 29.45 kg/m  Pain Assessment: 0-10   Pain Score: Asleep   SpO2: SpO2: 93 % O2 Device:SpO2: 93 % O2 Flow Rate: .O2 Flow Rate (L/min): 2 L/min  IO: Intake/output summary:  Intake/Output Summary (Last 24 hours) at 02/05/16 1512 Last data filed at 02/05/16 1225  Gross per 24 hour  Intake              463 ml  Output              502 ml  Net              -39 ml    LBM: Last BM Date: 02/04/16 Baseline Weight: Weight: 75.6 kg (166 lb 11.2 oz) Most recent weight: Weight: 73 kg (161 lb)     Palliative Assessment/Data:     Time In: 12:00 Time Out: 2:00 Time Total: 120 min. Greater than 50%  of this time was spent counseling and coordinating care related to the above assessment and plan.  Signed by: Imogene Burn, PA-C Palliative Medicine Pager: [phone]  Please contact Palliative Medicine Team phone at [phone] for questions and concerns.  For individual provider: See Shea Evans

## 2016-02-06 ENCOUNTER — Telehealth: Payer: Self-pay | Admitting: *Deleted

## 2016-02-06 LAB — CBC
HCT: 31.9 % — ABNORMAL LOW (ref 36.0–46.0)
Hemoglobin: 10.4 g/dL — ABNORMAL LOW (ref 12.0–15.0)
MCH: 29.1 pg (ref 26.0–34.0)
MCHC: 32.6 g/dL (ref 30.0–36.0)
MCV: 89.1 fL (ref 78.0–100.0)
PLATELETS: 139 10*3/uL — AB (ref 150–400)
RBC: 3.58 MIL/uL — AB (ref 3.87–5.11)
RDW: 18.4 % — ABNORMAL HIGH (ref 11.5–15.5)
WBC: 6.4 10*3/uL (ref 4.0–10.5)

## 2016-02-06 LAB — BASIC METABOLIC PANEL
Anion gap: 5 (ref 5–15)
CO2: 25 mmol/L (ref 22–32)
Calcium: 8.1 mg/dL — ABNORMAL LOW (ref 8.9–10.3)
Chloride: 102 mmol/L (ref 101–111)
Creatinine, Ser: 0.51 mg/dL (ref 0.44–1.00)
Glucose, Bld: 104 mg/dL — ABNORMAL HIGH (ref 65–99)
POTASSIUM: 4 mmol/L (ref 3.5–5.1)
SODIUM: 132 mmol/L — AB (ref 135–145)

## 2016-02-06 MED ORDER — RIFAXIMIN 550 MG PO TABS: 550.0000 mg | ORAL_TABLET | Freq: Two times a day (BID) | ORAL | 0 refills | Status: DC

## 2016-02-06 MED ORDER — RISPERIDONE 0.5 MG PO TABS: 0.5000 mg | ORAL_TABLET | Freq: Three times a day (TID) | ORAL | 0 refills | Status: DC | PRN

## 2016-02-06 MED ORDER — SULFAMETHOXAZOLE-TRIMETHOPRIM 800-160 MG PO TABS: 1.0000 | ORAL_TABLET | Freq: Every day | ORAL | 0 refills | Status: DC

## 2016-02-06 MED ORDER — NICOTINE 21 MG/24HR TD PT24: 21.0000 mg | MEDICATED_PATCH | Freq: Every day | TRANSDERMAL | 0 refills | Status: DC

## 2016-02-06 MED ORDER — LACTULOSE 10 GM/15ML PO SOLN: 30.0000 g | Freq: Three times a day (TID) | ORAL | 0 refills | Status: DC

## 2016-02-06 NOTE — Care Management Note (Signed)
Case Management Note  Patient Details  Name: Anne Black MRN: [id number] Date of Birth: [birth year]  Subjective/Objective:                    Action/Plan:  DC to SNF.  Expected Discharge Date:                  Expected Discharge Plan:  Skilled Nursing Facility  In-House Referral:  Clinical Social Work  Discharge planning Services     Post Acute Care Choice:    Choice offered to:     DME Arranged:    DME Agency:     HH Arranged:    HH Agency:     Status of Service:  Completed, signed off  If discussed at [employer] Meetings, dates discussed:    Additional Comments:  Lawerance SabalDebbie Keera Altidor, RN 02/06/2016, 12:04 PM

## 2016-02-06 NOTE — Progress Notes (Signed)
Family Medicine Teaching Service Daily Progress Note Intern Pager: [phone]  Patient name: Anne Black Medical record number: [id number] Date of birth: August 15, 1960 Age: 55 y.o. Gender: female  Primary Care Provider: [name], MD Consultants: None Code Status: Full Code  Assessment and Plan: 55 y.o. female presenting with dehydration, generalized pain, ascites. PMH is significant for Alcoholic Liver Cirrhosis with Ascites, Alcohol Abuse, Bipolar DO, anxiety, GERD.   #Alcoholic Cirrhosis with Ascites, persistent:  Improved abd distension.  Does not appear to be fluid overloaded and lungs are without crackles and she is satting well on room air.  --Continue Bactrim 800-160 mg 1 tab daily (start 12/21) for SBP prophylaxis --Discontinue CIWA scoring and PRN ativan, as patient is outside EtOH withdrawal window, confusion and agitation likely secondary to delirium --Consider as needed Haldol for agitation. Note QT 480 --Continue Spironolactone 100 mg, Rifaximin 550 mg bid, Lactulose 200 g q8.  --Continue Thiamine and Folic Acid --PT/OT-- PT>>SNF and 24hr supervision  --Social Work  --Psych consulted to help differentiate Withdrawal vs. Encephalopathy vs. Underlying worsening psychiatric disorder. Appreciate recommendations. Recommended risperdone PRN TID for anxiety --Consider establishing care with GI in [facility] prior to discharge -- ambulate 3x daily to decrease risk of delirium -- consulted palliative care   #Bipolar Disorder and Anxiety. Prozac 20mg  daily, Neurontin 400mg  TID, Risperdal 0.5mg , and Topamax on med list but uncertain if patient is taking or should be taking. Recently discharged from [facility] (01/08/16).  --Continue Gabapentin 400mg  TID --Continue Risperdal 0.5mg  qhs --Hold Prozac 20 mg daily for now due to QT prolongation  --Continue Sitter --Psych consulted, appreciate recommendations: hold fluoxitine and consider risperidone TID PRN if QTc  will allow  #Volume depletion, ongoing. Continues to have poor PO intake. Has been fine balance between low volume status Intravascularly and elevated fluid status Extravascularly requiring lasix.  --Nutrition consulted --Boost --Encourage PO intake.   #History of Prolonged QTC: QTc [phone] --Avoid QT prolonging medications   FEN/GI: Heart Healthy Prophylaxis: Lovenox, Protonix  Disposition: Discharge pending medical improvement  Subjective:  Denies any acute issues this AM. Denies any CP, SOB, NVD.   Objective: Temp:  [97.9 F (36.6 C)-98.6 F (37 C)] 97.9 F (36.6 C) (12/29 0711) Pulse Rate:  [88-95] 94 (12/29 0711) Resp:  [18-20] 18 (12/28 2220) BP: (103-118)/(54-61) 103/61 (12/29 0711) SpO2:  [93 %-97 %] 97 % (12/29 0711)  Physical Exam: General: 55 year old female lying in bed in NAD Cardiovascular: RRR, intact distal pulses Respiratory: no increased work of breathing noted, [place] in place, NWOB Gastrointestinal:Soft and moderately distended with marked ascities Derm: warm and dry Neuro: Alert and Oriented x3 Psych: Speech is still slow but improving, mood appropriate  Laboratory:  Recent Labs Lab 02/01/16 0416 02/03/16 0444 02/05/16 0450  WBC 5.7 6.6 6.9  HGB 9.6* 10.5* 10.1*  HCT 30.3* 33.0* 31.2*  PLT 157 156 149*    Recent Labs Lab 02/03/16 0444 02/04/16 0440 02/05/16 0450  NA 135 135 131*  K 3.4* 3.4* 3.3*  CL 101 99* 99*  CO2 28 24 27   BUN <5* <5* <5*  CREATININE 0.45 0.55 0.48  CALCIUM 8.3* 8.2* 7.7*  PROT  --  5.5*  --   BILITOT  --  1.8*  --   ALKPHOS  --  138*  --   ALT  --  17  --   AST  --  46*  --   GLUCOSE 103* 98 100*    Renne Musca, MD 02/06/2016, 7:17 AM  PGY-1, [facility] FPTS Intern pager: [phone], text pages welcome

## 2016-02-06 NOTE — Progress Notes (Signed)
Occupational Therapy Treatment Patient Details Name: Anne ApplebaumCarla S Black MRN: [id number] DOB: 1960-12-03 Today's Date: 02/06/2016    History of present illness 55 y.o. female presenting with dehydration, generalized pain, ascites. PMH is significant for Alcoholic Liver Cirrhosis with Ascites, Alcohol Abuse, Bipolar DO, anxiety, GERD   OT comments  Pt able to stand at sink with use of steady to complete grooming tasks. Pt requires mod assist +2 for standing unsupported in steady but able to perform sit to stand in steady with min guard assist. Pt continues to require cues for attention to task and sequencing. D/c plan remains appropriate. Will continue to follow acutely.   Follow Up Recommendations  SNF    Equipment Recommendations  None recommended by OT    Recommendations for Other Services      Precautions / Restrictions Precautions Precautions: Fall Restrictions Weight Bearing Restrictions: No       Mobility Bed Mobility Overal bed mobility: Needs Assistance Bed Mobility: Supine to Sit   Sidelying to sit: Mod assist       General bed mobility comments: increased time and effort to perform, assist to elevate trunk into sitting.  Pt able to pull on rails to scoot to edge of bed.    Transfers Overall transfer level: Needs assistance   Transfers: Sit to/from Stand Sit to Stand: Mod assist;+2 physical assistance;Min guard         General transfer comment: min guard with stedy without required mod assist +2 to maintain standing. Pt remains to lean posterior.     Balance Overall balance assessment: Needs assistance   Sitting balance-Leahy Scale: Fair   Postural control: Posterior lean Standing balance support: No upper extremity supported;During functional activity Standing balance-Leahy Scale: Poor                     ADL Overall ADL's : Needs assistance/impaired     Grooming: Minimal assistance;Standing;Wash/dry hands;Oral care (using steady for  standing support)           Upper Body Dressing : Minimal assistance;Standing (standing in steady)                     General ADL Comments: Min guard for sit to stand with use of steady; mod +2 for standing in steady without UE support.      Vision                     Perception     Praxis      Cognition   Behavior During Therapy: Flat affect Overall Cognitive Status: Difficult to assess          Following Commands: Follows one step commands inconsistently (perseverating on wanting to talk with her mother) Safety/Judgement: Decreased awareness of safety;Decreased awareness of deficits   Problem Solving: Slow processing General Comments: remains distracted during treatment.      Extremity/Trunk Assessment               Exercises     Shoulder Instructions       General Comments      Pertinent Vitals/ Pain       Pain Assessment: Faces Faces Pain Scale: Hurts a little bit Pain Location: back Pain Descriptors / Indicators: Constant Pain Intervention(s): Monitored during session;Repositioned  Home Living  Prior Functioning/Environment              Frequency  Min 2X/week        Progress Toward Goals  OT Goals(current goals can now be found in the care plan section)  Progress towards OT goals: Progressing toward goals  Acute Rehab OT Goals Patient Stated Goal: get a drink of water OT Goal Formulation: With patient  Plan Discharge plan remains appropriate    Co-evaluation    PT/OT/SLP Co-Evaluation/Treatment: Yes Reason for Co-Treatment: Complexity of the patient's impairments (multi-system involvement);To address functional/ADL transfers;For patient/therapist safety   OT goals addressed during session: ADL's and self-care      End of Session Equipment Utilized During Treatment: Other (comment) (steady)   Activity Tolerance Patient tolerated treatment  well   Patient Left in chair;with call bell/phone within reach;with chair alarm set;with family/visitor present   Nurse Communication Mobility status        Time: 1137-1202 OT Time Calculation (min): 25 min  Charges: OT General Charges $OT Visit: 1 Procedure OT Treatments $Self Care/Home Management : 8-22 mins  Gaye Alken M.S., OTR/L Pager: [phone]  02/06/2016, 1:25 PM

## 2016-02-06 NOTE — Clinical Social Work Placement (Signed)
   CLINICAL SOCIAL WORK PLACEMENT  NOTE  Date:  02/06/2016  Patient Details  Name: Anne Black MRN: [id number] Date of Birth: 1960/11/29  Clinical Social Work is seeking post-discharge placement for this patient at the Skilled  Nursing Facility level of care (*CSW will initial, date and re-position this form in  chart as items are completed):  Yes   Patient/family provided with [facility] Clinical Social Work Department's list of facilities offering this level of care within the geographic area requested by the patient (or if unable, by the patient's family).  Yes   Patient/family informed of their freedom to choose among providers that offer the needed level of care, that participate in Medicare, Medicaid or managed care program needed by the patient, have an available bed and are willing to accept the patient.  Yes   Patient/family informed of [facility]'s ownership interest in [facility] and [facility], as well as of the fact that they are under no obligation to receive care at these facilities.  PASRR submitted to EDS on 01/25/16     PASRR number received on 01/30/16     Existing PASRR number confirmed on       FL2 transmitted to all facilities in geographic area requested by pt/family on 01/30/16     FL2 transmitted to all facilities within larger geographic area on       Patient informed that his/her managed care company has contracts with or will negotiate with certain facilities, including the following:        Yes   Patient/family informed of bed offers received.  Patient chooses bed at [facility]     Physician recommends and patient chooses bed at      Patient to be transferred to [facility] on 02/06/16.  Patient to be transferred to facility by ptar     Patient family notified on 02/06/16 of transfer.  Name of family member notified:  mom     PHYSICIAN Please sign FL2     Additional Comment:     _______________________________________________ Burna Sis, LCSW 02/06/2016, 12:14 PM

## 2016-02-06 NOTE — Progress Notes (Signed)
No charge note.  Patient with increased diarrhea yesterday.  Per mother and family  Judie Petit(M. Hatley) patient is completely confused today and unable to make decisions regarding HCPOA or Living Will.  Will delay Palliative Meeting today.  Will follow peripherally and look for a time to re-engage when we may be able to assist the family and patient with advanced directives.  If she discharges to SNF today the attending team will request Palliative Care to follow at SNF in the D/C summary.  Algis DownsMarianne York, [place] JerseyPA-C Palliative Medicine Pager: [phone]

## 2016-02-06 NOTE — Progress Notes (Signed)
Patient will discharge to [facility] Anticipated discharge date: 12/29 Family notified: mom at bedside Transportation by PTAR- scheduled at 1:30pm  CSW signing off.  Burna Sis, LCSW Clinical Social Worker [phone]

## 2016-02-06 NOTE — Telephone Encounter (Signed)
Pt calling in stating that she doesn't like where the dr sent her for rehab. She that the people there are on different stages than she is and she cant stay. Wants to know if the dr could change her to somewhere else. Celia Gibbons Bruna PotterBlount, CMA

## 2016-02-06 NOTE — Progress Notes (Signed)
Physical Therapy Treatment Patient Details Name: Anne Black MRN: [id number] DOB: 20-Mar-1960 Today's Date: 02/06/2016    History of Present Illness 55 y.o. female presenting with dehydration, generalized pain, ascites. PMH is significant for Alcoholic Liver Cirrhosis with Ascites, Alcohol Abuse, Bipolar DO, anxiety, GERD    PT Comments    Pt performed increased activity.  Performed multiple standing trials to perform ADLs at sink.  Remains to present with poor balance.  Continue to recommend rehab at SNF to improve strength and balance before returning home.    Follow Up Recommendations  SNF;Supervision/Assistance - 24 hour     Equipment Recommendations  None recommended by PT    Recommendations for Other Services       Precautions / Restrictions Precautions Precautions: Fall Restrictions Weight Bearing Restrictions: No    Mobility  Bed Mobility Overal bed mobility: Needs Assistance Bed Mobility: Supine to Sit;Sit to Supine   Sidelying to sit: Mod assist       General bed mobility comments: increased time and effort to perform, assist to elevate trunk into sitting.  Pt able to pull on rails to scoot to edge of bed.    Transfers Overall transfer level: Needs assistance   Transfers: Sit to/from Stand Sit to Stand: Mod assist;+2 physical assistance;Min guard (min guard with stedy without required mod assist +2 to maintain standing.  Pt remains to lean posterior.  )         General transfer comment: Cues for hand placement performed sit to stand multiple times from edge of bed and stedy frame.    Ambulation/Gait Ambulation/Gait assistance:  (refused)               Stairs            Wheelchair Mobility    Modified Rankin (Stroke Patients Only)       Balance                                    Cognition Arousal/Alertness: Awake/alert Behavior During Therapy: Flat affect Overall Cognitive Status: Difficult to assess          Following Commands: Follows one step commands inconsistently (perseverates on wanting top speak to her mother.  ) Safety/Judgement: Decreased awareness of safety;Decreased awareness of deficits   Problem Solving: Slow processing General Comments: remains distracted during treatment.      Exercises      General Comments        Pertinent Vitals/Pain Pain Assessment: Faces Pain Location: back Pain Descriptors / Indicators: Constant Pain Intervention(s): Monitored during session;Repositioned    Home Living                      Prior Function            PT Goals (current goals can now be found in the care plan section) Acute Rehab PT Goals Patient Stated Goal: get a drink of water Potential to Achieve Goals: Fair Progress towards PT goals: Progressing toward goals    Frequency    Min 2X/week      PT Plan Current plan remains appropriate    Co-evaluation PT/OT/SLP Co-Evaluation/Treatment: Yes Reason for Co-Treatment: Complexity of the patient's impairments (multi-system involvement);For patient/therapist safety         End of Session   Activity Tolerance: Patient limited by fatigue Patient left: in chair;with family/visitor present;with chair alarm set;with nursing/sitter in room (avasys  sitter.)     Time: 1829-9371 PT Time Calculation (min) (ACUTE ONLY): 23 min  Charges:  $Therapeutic Activity: 8-22 mins                    G Codes:      Florestine Avers 02/13/16, 12:16 PM  Joycelyn Rua, PTA pager [phone]

## 2016-02-06 NOTE — Progress Notes (Signed)
Anne Black to be D/C'd Skilled nursing facility per MD order.  Discussed with the patient and all questions fully answered.  VSS, Skin clean, dry and intact without evidence of skin break down, no evidence of skin tears noted. IV catheter discontinued intact. Site without signs and symptoms of complications. Dressing and pressure applied.  Report called to [facility] and all questions answered.  Patient D/C'd via PTAR.  Anne Black [name] 02/06/2016 1:38 PM

## 2016-02-06 NOTE — Progress Notes (Signed)
Visited with patient per spiritual care consult.  Patient is alert and welcomed brief visit.  Patient asked if I would call her attending PA.  I made nurse and director aware of her request.  Provided ministry of presence.  Will follow as needed.   02/06/16 5277  Clinical Encounter Type  Visited With Patient;Health care provider  Visit Type Initial;Spiritual support  Referral From Other (Comment)  Consult/Referral To (Spiritual Consult)  Spiritual Encounters  Spiritual Needs Emotional  Stress Factors  Patient Stress Factors None identified  Venida Jarvis, Chaplain,pager [phone]

## 2016-02-10 ENCOUNTER — Non-Acute Institutional Stay (SKILLED_NURSING_FACILITY): Payer: Managed Care, Other (non HMO) | Admitting: Internal Medicine

## 2016-02-10 ENCOUNTER — Encounter: Payer: Self-pay | Admitting: Internal Medicine

## 2016-02-10 DIAGNOSIS — G621 Alcoholic polyneuropathy: Secondary | ICD-10-CM

## 2016-02-10 DIAGNOSIS — Z789 Other specified health status: Secondary | ICD-10-CM | POA: Diagnosis not present

## 2016-02-10 DIAGNOSIS — G4700 Insomnia, unspecified: Secondary | ICD-10-CM | POA: Diagnosis not present

## 2016-02-10 DIAGNOSIS — F3162 Bipolar disorder, current episode mixed, moderate: Secondary | ICD-10-CM | POA: Diagnosis not present

## 2016-02-10 DIAGNOSIS — E871 Hypo-osmolality and hyponatremia: Secondary | ICD-10-CM | POA: Diagnosis not present

## 2016-02-10 DIAGNOSIS — D696 Thrombocytopenia, unspecified: Secondary | ICD-10-CM

## 2016-02-10 DIAGNOSIS — E43 Unspecified severe protein-calorie malnutrition: Secondary | ICD-10-CM

## 2016-02-10 DIAGNOSIS — D638 Anemia in other chronic diseases classified elsewhere: Secondary | ICD-10-CM

## 2016-02-10 DIAGNOSIS — G35 Multiple sclerosis: Secondary | ICD-10-CM | POA: Diagnosis not present

## 2016-02-10 DIAGNOSIS — K219 Gastro-esophageal reflux disease without esophagitis: Secondary | ICD-10-CM | POA: Diagnosis not present

## 2016-02-10 DIAGNOSIS — R5381 Other malaise: Secondary | ICD-10-CM

## 2016-02-10 DIAGNOSIS — E876 Hypokalemia: Secondary | ICD-10-CM

## 2016-02-10 DIAGNOSIS — K7031 Alcoholic cirrhosis of liver with ascites: Secondary | ICD-10-CM

## 2016-02-10 DIAGNOSIS — Z7289 Other problems related to lifestyle: Secondary | ICD-10-CM

## 2016-02-10 NOTE — Progress Notes (Signed)
LOCATION: [facility]  PCP: [name], MD   Code Status: Full Code  Goals of care: Advanced Directive information Advanced Directives 01/23/2016  Does Patient Have a Medical Advance Directive? No  Would patient like information on creating a medical advance directive? No - Patient declined  Pre-existing out of facility DNR order (yellow form or pink MOST form) -  Some encounter information is confidential and restricted. Go to Review Flowsheets activity to see all data.       Extended Emergency Contact Information Primary Emergency Contact: August Luz [place] Home Phone: [phone] Mobile Phone: [phone] Relation: Mother Secondary Emergency Contact: Peri Maris [place] Home Phone: [phone] Mobile Phone: [phone] Relation: Friend   No Known Allergies  Chief Complaint  Patient presents with  . New Admit To SNF    New Admision Visit      HPI:  Patient is a 56 y.o. female seen today for short term rehabilitation post hospital admission from 01/22/2016-02/06/2016 with hepatic encephalopathy, dehydration, cystitis and Proteus UTI. She underwent paracentesis and was placed on Bactrim for SBP prophylaxis. She had CIWA protocol initiated in the hospital. Her rifaximin main, lactulose, Spironolactone and Lasix were resumed. She was seen by psychiatric services for her behavioral issues and placed on risperidone. She was also seen by palliative care service given her poor overall prognosis. She was treated for urinary tract infection. She is seen in her room today with her daughter at bedside. She has medical history of alcoholic liver cirrhosis with ascites, alcohol abuse. She has been noncompliant with her medications and doctor visits prior to admission.  Review of Systems:  Constitutional: Negative for fever, chills, diaphoresis.  positive for generalized weakness. HENT: Negative for headache, congestion,  nasal discharge, sore throat. Positive for occasional difficulty swallowing.    Eyes: Negative for blurred vision, double vision and discharge. Wears glasses.   Respiratory: Negative for cough, shortness of breath and wheezing.   Cardiovascular: Negative for chest pain, palpitations, leg swelling.  Gastrointestinal: Negative for nausea, vomiting, loss of appetite. Positive for right quadrant abdominal discomfort and heartburn. Last bowel movement was yesterday. Denies loose stool or blood in stool   Genitourinary: Negative for dysuria and flank pain.  Musculoskeletal: Negative for back pain, fall in the facility.  Skin: Negative for itching, rash.  Neurological: Negative for dizziness. Psychiatric/Behavioral: Negative for depression.   Past Medical History:  Diagnosis Date  . ADHD (attention deficit hyperactivity disorder)   . Anxiety   . Arthritis   . Ascites   . Bipolar disorder (HCC)   . Cirrhosis (HCC)   . Hypokalemia   . Migraine   . Multiple sclerosis (HCC)   . Osteoporosis    Past Surgical History:  Procedure Laterality Date  . CESAREAN SECTION  [id number]  . MYRINGOTOMY WITH TUBE PLACEMENT Bilateral   . TONSILLECTOMY     Social History:   reports that she has been smoking Cigarettes.  She has a 15.00 pack-year smoking history. She has never used smokeless tobacco. She reports that she drinks about 4.8 oz of alcohol per week . She reports that she does not use drugs.  Family History  Problem Relation Age of Onset  . Heart disease Mother   . Heart disease Father   . Hypertension Father     Medications: Allergies as of 02/10/2016   No Known Allergies     Medication List       Accurate as of 02/10/16 11:38 AM.  Always use your most recent med list.          albuterol 108 (90 Base) MCG/ACT inhaler Commonly known as:  PROVENTIL HFA;VENTOLIN HFA Inhale 2 puffs into the lungs every 6 (six) hours as needed for wheezing or shortness of breath.   amantadine 100 MG  capsule Commonly known as:  SYMMETREL Take 1 capsule (100 mg total) by mouth 2 (two) times daily.   baclofen 10 MG tablet Commonly known as:  LIORESAL Take 1 tablet (10 mg total) by mouth 3 (three) times daily as needed for muscle spasms.   folic acid 1 MG tablet Commonly known as:  FOLVITE Take 1 tablet (1 mg total) by mouth daily.   furosemide 40 MG tablet Commonly known as:  LASIX Take 1 tablet (40 mg total) by mouth daily.   gabapentin 100 MG capsule Commonly known as:  NEURONTIN Take 2 capsules (200 mg total) by mouth 3 (three) times daily.   lactulose 10 GM/15ML solution Commonly known as:  CHRONULAC Take 45 mLs (30 g total) by mouth 3 (three) times daily.   Melatonin 3 MG Tabs Take 1 tablet (3 mg total) by mouth at bedtime as needed (sleep).   multivitamin with minerals Tabs tablet Take 1 tablet by mouth daily.   nicotine 21 mg/24hr patch Commonly known as:  NICODERM CQ - dosed in mg/24 hours Place 1 patch (21 mg total) onto the skin daily.   ondansetron 4 MG disintegrating tablet Commonly known as:  ZOFRAN ODT 4mg  ODT q4 hours prn nausea/vomit   pantoprazole 40 MG tablet Commonly known as:  PROTONIX Take 1 tablet (40 mg total) by mouth daily.   polyethylene glycol packet Commonly known as:  MIRALAX / GLYCOLAX Take 17 g by mouth daily as needed for mild constipation.   Potassium Chloride ER 20 MEQ Tbcr Take 40 mEq by mouth daily.   rifaximin 550 MG Tabs tablet Commonly known as:  XIFAXAN Take 1 tablet (550 mg total) by mouth 2 (two) times daily.   risperiDONE 0.5 MG tablet Commonly known as:  RISPERDAL Take 1 tablet (0.5 mg total) by mouth at bedtime.   risperiDONE 0.5 MG tablet Commonly known as:  RISPERDAL Take 1 tablet (0.5 mg total) by mouth 3 (three) times daily as needed (agitation).   spironolactone 100 MG tablet Commonly known as:  ALDACTONE Take 1 tablet (100 mg total) by mouth daily.   sulfamethoxazole-trimethoprim 800-160 MG  tablet Commonly known as:  BACTRIM DS,SEPTRA DS Take 1 tablet by mouth daily.   topiramate 100 MG tablet Commonly known as:  TOPAMAX Take 1 tablet (100 mg total) by mouth daily.   UNABLE TO FIND Med Name: Med pass 120 mL by mouth twice daily between meals       Immunizations: Immunization History  Administered Date(s) Administered  . Influenza,inj,Quad PF,36+ Mos 11/05/2014, 11/05/2015  . Pneumococcal Polysaccharide-23 11/05/2014  . Tdap 02/01/2013     Physical Exam: Vitals:   02/10/16 1125  BP: 106/60  Pulse: 92  Resp: 16  Temp: 97.5 F (36.4 C)  TempSrc: Oral  SpO2: 97%  Weight: 166 lb (75.3 kg)  Height: 5\' 2"  (1.575 m)   Body mass index is 30.36 kg/m.  General- Adult, obese female, in no acute distress Head- normocephalic, atraumatic Nose- no maxillary or frontal sinus tenderness, no nasal discharge Throat- moist mucus membrane, angular chelosis present Eyes- PERRLA, EOMI, no pallor, no icterus Neck- no cervical lymphadenopathy Cardiovascular- normal s1,s2, no murmur Respiratory- bilateral clear to auscultation,  no wheeze, no rhonchi, no crackles, no use of accessory muscles Abdomen- bowel sounds present, soft, distended, periumbilical hernia, no guarding or rigidity, tender to palpation on right upper quadrant Musculoskeletal- able to move all 4 extremities, generalized weakness, 1+ pitting leg edema  Neurological- alert and oriented to person, place and time, asterixis present Skin- warm and dry Nails- hypertrophy, ingrown Psychiatry- normal mood and affect    Labs reviewed: Basic Metabolic Panel:  Recent Labs  16/10/96 1021 10/19/15 0517 10/20/15 1600  01/25/16 0533  02/04/16 0440 02/04/16 1600 02/05/16 0450 02/06/16 0650  NA  --  136 135  < > 137  < > 135  --  131* 132*  K  --  3.7 3.1*  < > 3.7  < > 3.4*  --  3.3* 4.0  CL  --  104 106  < > 104  < > 99*  --  99* 102  CO2  --  25 23  < > 24  < > 24  --  27 25  GLUCOSE  --  93 94  < > 152*   < > 98  --  100* 104*  BUN  --  <5* <5*  < > <5*  < > <5*  --  <5* <5*  CREATININE  --  0.41* 0.62  < > 0.46  < > 0.55  --  0.48 0.51  CALCIUM  --  8.1* 8.1*  < > 8.1*  < > 8.2*  --  7.7* 8.1*  MG 1.9 2.1 1.9  < > 2.2  --  1.6* 1.7  --   --   PHOS 1.1* 3.3 3.2  --   --   --   --   --   --   --   < > = values in this interval not displayed. Liver Function Tests:  Recent Labs  01/25/16 2230 01/27/16 0536 01/29/16 1132 02/04/16 0440  AST 41 39  --  46*  ALT 17 17  --  17  ALKPHOS 141* 138*  --  138*  BILITOT 1.9* 1.9*  --  1.8*  PROT 5.9* 5.1*  --  5.5*  ALBUMIN 2.5* 2.1* 2.5* 2.1*    Recent Labs  12/12/15 1049 12/18/15 1125 01/22/16 2047  LIPASE 21 24 21     Recent Labs  11/04/15 1654 12/12/15 1123 01/22/16 2108  AMMONIA 50* 30 84*   CBC:  Recent Labs  10/15/15 1037  12/12/15 1049 12/18/15 1125  02/03/16 0444 02/05/16 0450 02/06/16 0650  WBC 9.0  < > 7.6 7.2  < > 6.6 6.9 6.4  NEUTROABS 6.8  --  6.0 5.0  --   --   --   --   HGB 13.2  < > 11.7* 11.6*  < > 10.5* 10.1* 10.4*  HCT 38.4  < > 35.4* 35.4*  < > 33.0* 31.2* 31.9*  MCV 94.3  < > 93.7 95.7  < > 91.7 88.4 89.1  PLT 170  < > 134* 116*  < > 156 149* 139*  < > = values in this interval not displayed. Cardiac Enzymes:  Recent Labs  10/15/15 1414 01/23/16 0132  CKTOTAL  --  26*  TROPONINI <0.03  --    BNP: Invalid input(s): POCBNP CBG:  Recent Labs  06/01/15 2140 10/17/15 1149  GLUCAP 94 99    Radiological Exams: Dg Chest 2 View  Result Date: 01/25/2016 CLINICAL DATA:  Tachypnea.  Cough. EXAM: CHEST  2 VIEW COMPARISON:  Previous examinations, the  most recent dated 01/22/2016. FINDINGS: The cardiac silhouette remains borderline enlarged. A prominent epicardial fat pad is again demonstrated with a small area of probable scarring in that region without significant change. Otherwise, the lungs are clear with mildly prominent interstitial markings. Diffuse osteopenia. IMPRESSION: No acute  abnormality. Mild chronic interstitial lung disease and borderline cardiomegaly. Electronically Signed   By: Beckie Salts M.D.   On: 01/25/2016 08:40   Dg Abd 1 View  Result Date: 01/29/2016 CLINICAL DATA:  Abdominal distention EXAM: ABDOMEN - 1 VIEW COMPARISON:  01/25/2016.  01/23/2016 FINDINGS: Interval progression of diffuse gaseous bowel distention with a central predominance. Visualized bony anatomy is unremarkable. Telemetry leads overlie the lower chest. IMPRESSION: Gaseous bowel distention in the central abdomen compatible with ileus or evolving obstruction. Centralization of bowel loops suggests associated ascites. Electronically Signed   By: Kennith Center M.D.   On: 01/29/2016 16:57   Dg Abd 1 View  Result Date: 01/23/2016 CLINICAL DATA:  Hepatic cirrhosis.  Abdominal pain. EXAM: ABDOMEN - 1 VIEW COMPARISON:  None. FINDINGS: The bowel gas pattern is normal. No radio-opaque calculi or other significant radiographic abnormality are seen. IMPRESSION: No evidence of bowel obstruction or ileus. Electronically Signed   By: Lupita Raider, M.D.   On: 01/23/2016 16:21   US Abdomen Limited  Result Date: 01/23/2016 CLINICAL DATA:  Patient with a history of cirrhosis and recurrent abdominal ascites. Request was made today for paracentesis. EXAM: LIMITED ABDOMEN ULTRASOUND FOR ASCITES TECHNIQUE: Limited ultrasound survey for ascites was performed in all four abdominal quadrants. COMPARISON:  None. FINDINGS: A minimal amount of ascites is noted throughout the abdomen. Due to bowel in close proximity to her peritoneum, there is no safe window in order to perform the paracentesis for fluid removal. IMPRESSION: Minimal ascites with unsafe window to perform paracentesis. Procedure not done. Read by: Barnetta Chapel, PA-C Electronically Signed   By: Gilmer Mor D.O.   On: 01/23/2016 12:31   US Paracentesis  Result Date: 01/31/2016 INDICATION: Recurrent ascites EXAM: ULTRASOUND-GUIDED PARACENTESIS  COMPARISON:  Previous paracentesis MEDICATIONS: 10 cc 1% lidocaine COMPLICATIONS: None immediate. TECHNIQUE: Informed written consent was obtained from the patient after a discussion of the risks, benefits and alternatives to treatment. A timeout was performed prior to the initiation of the procedure. Initial ultrasound scanning demonstrates a large amount of ascites within the right lower abdominal quadrant. The right lower abdomen was prepped and draped in the usual sterile fashion. 1% lidocaine with epinephrine was used for local anesthesia. Under direct ultrasound guidance, a 19 gauge, 7-cm, Yueh catheter was introduced. An ultrasound image was saved for documentation purposed. The paracentesis was performed. The catheter was removed and a dressing was applied. The patient tolerated the procedure well without immediate post procedural complication. FINDINGS: A total of approximately 3 liters of yellow fluid was removed. Samples were sent to the laboratory as requested by the clinical team. IMPRESSION: Successful ultrasound-guided paracentesis yielding 3 liters of peritoneal fluid. Read by Robet Leu [facility] Electronically Signed   By: Jolaine Click M.D.   On: 01/31/2016 12:04   US Paracentesis  Result Date: 01/26/2016 INDICATION: Alcoholic cirrhosis. Recurrent ascites. Request diagnostic and therapeutic paracentesis. EXAM: ULTRASOUND GUIDED RIGHT LOWER QUADRANT PARACENTESIS MEDICATIONS: None. COMPLICATIONS: None immediate. PROCEDURE: Informed written consent was obtained from the patient after a discussion of the risks, benefits and alternatives to treatment. A timeout was performed prior to the initiation of the procedure. Initial ultrasound scanning demonstrates a large amount of ascites within the  right lower abdominal quadrant. The right lower abdomen was prepped and draped in the usual sterile fashion. 1% lidocaine with epinephrine was used for local anesthesia. Following this, a Safe-T-Centesis  catheter was introduced. An ultrasound image was saved for documentation purposes. The paracentesis was performed. The catheter was removed and a dressing was applied. The patient tolerated the procedure well without immediate post procedural complication. FINDINGS: A total of approximately 2.7 L of clear yellow fluid was removed. Samples were sent to the laboratory as requested by the clinical team. IMPRESSION: Successful ultrasound-guided paracentesis yielding 2.7 liters of peritoneal fluid. Read by: Brayton El PA-C Electronically Signed   By: Jolaine Click M.D.   On: 01/26/2016 10:29   Dg Chest Port 1 View  Result Date: 01/28/2016 CLINICAL DATA:  Shortness of breath EXAM: PORTABLE CHEST 1 VIEW COMPARISON:  01/27/2016 FINDINGS: The heart size and mediastinal contours are within normal limits. Both lungs are clear. The visualized skeletal structures are unremarkable. IMPRESSION: No active disease. Electronically Signed   By: Elige Ko   On: 01/28/2016 13:19   Dg Chest Port 1 View  Result Date: 01/28/2016 CLINICAL DATA:  56 year old female with increasing shortness of breath and wheezing. EXAM: PORTABLE CHEST 1 VIEW COMPARISON:  Chest radiograph dated 01/25/2016 FINDINGS: Minimal bibasilar densities, left greater right most likely atelectatic changes. Developing infiltrate is less likely but not entirely excluded. Clinical correlation is recommended. Mild diffuse interstitial coarsening as seen on the prior radiograph. No pleural effusion or pneumothorax. The cardiac silhouette is within normal limits with no acute osseous pathology. IMPRESSION: Bibasilar densities, likely atelectatic changes. Developing infiltrates are not excluded. Electronically Signed   By: Elgie Collard M.D.   On: 01/28/2016 01:21   Dg Chest Port 1 View  Result Date: 01/22/2016 CLINICAL DATA:  56 y/o  F; shortness of breath. EXAM: PORTABLE CHEST 1 VIEW COMPARISON:  12/18/2015 chest radiograph FINDINGS: Stable cardiac  silhouette within normal limits. Prominent epicardial fat pad. No focal consolidation. No pleural effusion. Bones are unremarkable. IMPRESSION: No active disease. Electronically Signed   By: Mitzi Hansen M.D.   On: 01/22/2016 23:48   Dg Abd Portable 1v  Result Date: 01/25/2016 CLINICAL DATA:  Abdominal distension decreased bowel sounds EXAM: PORTABLE ABDOMEN - 1 VIEW COMPARISON:  01/23/2016 FINDINGS: Scattered large and small bowel gas is noted. No obstructive changes are seen. No acute bony abnormality is noted. No mass lesion is seen. IMPRESSION: No acute abnormality noted. Electronically Signed   By: Alcide Clever M.D.   On: 01/25/2016 17:57    Assessment/Plan  Physical deconditioning With generalized weakness. Will have her to work with physical therapy and occupational therapy as tolerated to help regain her strength and balance.  Alcoholic liver cirrhosis with ascites  Status post paracentesis in the hospital. Will make GI follow-up. Has asterixis on exam. Currently on lactulose 45 mL 3 times a day, Lasix 40 mg daily, Spironolactone 100 mg daily and rifaximin main 550 mg twice a day. Monitor bowel movement. Monitor mental status. Continue Bactrim double strength 1 tab daily for SBP prophylaxis until seen by gastroenterologist.  Alcohol abuse Counseled on abstinence from alcohol. Continue folic acid and thiamine supplement.  Hypokalemia With her on Lasix, continue potassium supplement. Check BMP.  Anemia of chronic disease Monitor CBC  Thrombocytopenia No frank bleeding reported. Has advanced alcoholic liver cirrhosis. Monitor her platelet count. Monitor for signs of bleeding.  Hyponatremia Monitor BMP  Mood disorder Currently on topiramate 100 mg daily, risperidone 0.5 mg daily at  bedtime, risperidone 0.5 mg 3 times a day as needed for agitation. Get psychiatric service to evaluate.  Severe protein calorie malnutrition With the history of chronic alcohol use,  alcoholic liver cirrhosis. RD to evaluate. Monitor her weight. Continue med Pass supplement. Have pt on thiamine and folic acid.   Gastroesophageal reflux disease Continue pantoprazole 40 mg daily for now and monitor  Multiple sclerosis Denies pain this visit. Has generalized weakness. Continue baclofen 10 mg 3 times a day as needed for muscle spasm.   Insomnia Continue melatonin 3 mg daily at bedtime as needed for sleep.  Peripheral neuropathy Continue gabapentin 200 mg 3 times a day.   Goals of care: short term rehabilitation   Labs/tests ordered: cbc, cmp 02/11/16  Family/ staff Communication: reviewed care plan with patient and nursing supervisor    Oneal Grout, MD Internal Medicine [facility] [address] [place], [place] [address] Cell Phone (Monday-Friday 8 am - 5 pm): [phone] On Call: [phone] and follow prompts after 5 pm and on weekends Office Phone: [phone] Office Fax: [phone]

## 2016-02-12 NOTE — Telephone Encounter (Signed)
[facility] at [phone] to get in touch with patient to return her call. Initially attempted mobile number but this kept ringing.   Patient reports that she is doing well. She is taking part in PT. She asks if she can "just leave" or if she needs to be discharged from SNF. We discussed that PT and the doctors there will continue to evaluate her and will determine when it is safe to take the next step with discharge from SNF and admission to alcohol rehab. She reports that she has not thought about drinking. I reported that I am glad things are going well for her thus far. Patient had no further questions or concerns.

## 2016-02-13 ENCOUNTER — Non-Acute Institutional Stay (SKILLED_NURSING_FACILITY): Payer: Managed Care, Other (non HMO) | Admitting: Adult Health

## 2016-02-13 ENCOUNTER — Encounter: Payer: Self-pay | Admitting: Adult Health

## 2016-02-13 DIAGNOSIS — R5381 Other malaise: Secondary | ICD-10-CM

## 2016-02-13 DIAGNOSIS — Z720 Tobacco use: Secondary | ICD-10-CM

## 2016-02-13 DIAGNOSIS — E871 Hypo-osmolality and hyponatremia: Secondary | ICD-10-CM | POA: Diagnosis not present

## 2016-02-13 DIAGNOSIS — Z7289 Other problems related to lifestyle: Secondary | ICD-10-CM

## 2016-02-13 DIAGNOSIS — K7031 Alcoholic cirrhosis of liver with ascites: Secondary | ICD-10-CM | POA: Diagnosis not present

## 2016-02-13 DIAGNOSIS — E876 Hypokalemia: Secondary | ICD-10-CM | POA: Diagnosis not present

## 2016-02-13 DIAGNOSIS — D638 Anemia in other chronic diseases classified elsewhere: Secondary | ICD-10-CM

## 2016-02-13 DIAGNOSIS — G621 Alcoholic polyneuropathy: Secondary | ICD-10-CM

## 2016-02-13 DIAGNOSIS — D696 Thrombocytopenia, unspecified: Secondary | ICD-10-CM

## 2016-02-13 DIAGNOSIS — K219 Gastro-esophageal reflux disease without esophagitis: Secondary | ICD-10-CM

## 2016-02-13 DIAGNOSIS — F3162 Bipolar disorder, current episode mixed, moderate: Secondary | ICD-10-CM

## 2016-02-13 DIAGNOSIS — G35 Multiple sclerosis: Secondary | ICD-10-CM | POA: Diagnosis not present

## 2016-02-13 DIAGNOSIS — Z789 Other specified health status: Secondary | ICD-10-CM | POA: Diagnosis not present

## 2016-02-13 NOTE — Progress Notes (Signed)
DATE:  02/13/2016   MRN:  [id number]  BIRTHDAY: [birth year]  Facility:  Nursing Home Location:  [facility]  Nursing Home Room Number: 903-A  LEVEL OF CARE:  SNF (31)  Contact Information    Name Relation Home Work Mobile   Hatley,Marjorie Mother [phone]  [phone]   Bard Herbert [phone]  [phone]   Browder,[name] Daughter   [phone]   Pfefferle,Garland Daughter   [phone]       Code Status History    Date Active Date Inactive Code Status Order ID Comments User Context   01/22/2016 11:59 PM 02/06/2016  4:52 PM Full Code [id number]  [name], MD Inpatient   12/18/2015  5:17 PM 12/19/2015  2:39 PM Full Code [id number]  Pincus Large, DO Inpatient   11/04/2015  3:57 PM 11/05/2015  7:41 PM Full Code [id number]  [name], MD Inpatient   10/15/2015  3:21 PM 10/24/2015  9:08 PM Full Code [id number]  Arvilla Market, DO Inpatient   08/22/2015  9:12 PM 08/30/2015  5:45 PM Full Code [id number]  Hillary Bow, DO ED   07/31/2015  3:44 PM 08/04/2015  4:47 PM Full Code [id number]  Charm Rings, NP Inpatient   07/31/2015 12:41 AM 07/31/2015  3:44 PM Full Code [id number]  Antony Madura, PA-C ED   05/18/2015  3:31 PM 06/05/2015  2:56 PM Full Code [id number]  Edsel Petrin, DO Inpatient   02/21/2015  1:36 AM 02/21/2015  2:29 PM Full Code [id number]  Worthy Flank, NP Inpatient   02/20/2015  5:37 PM 02/21/2015  1:36 AM Full Code [id number]  Benjiman Core, MD ED   11/12/2014  4:16 PM 11/16/2014  5:27 PM Full Code [id number]  Earney Navy, NP Inpatient   11/12/2014  4:06 PM 11/12/2014  4:16 PM Full Code [id number]  Earney Navy, NP Inpatient   11/12/2014  1:41 AM 11/12/2014  4:05 PM Full Code [id number]  Elpidio Anis, PA-C ED   10/30/2014 10:42 PM 11/05/2014  4:26 PM Full Code [id number]  Earney Navy, NP Inpatient   10/30/2014 12:04 AM 10/30/2014 10:42 PM Full Code [id number]  Richardean Canal, MD ED   04/07/2013  8:41 PM 04/12/2013  4:37 PM  Full Code [id number]  Court Joy, PA-C Inpatient   04/06/2013 11:13 PM 04/07/2013  8:41 PM Full Code [id number]  Richardean Canal, MD ED   11/01/2011  4:23 AM 11/02/2011  1:45 PM Full Code [id number]  Hillary Bow, DO ED   09/26/2011 10:15 PM 09/28/2011  1:45 AM Full Code [id number]  Geoffery Lyons, MD ED   05/17/2011  7:16 AM 05/20/2011  9:52 PM Full Code [id number]  Grant Fontana, PA-C ED       Chief Complaint  Patient presents with  . Discharge Note    HISTORY OF PRESENT ILLNESS:  This is a 55-YO female seen for a discharge visit. She will discharge home with Home health PT, OT, CNA, ST and Child psychotherapist.  She was admitted to [facility] on 02/06/16 for short-term rehabilitation following a stay at [facility] from 01/22/16-02/06/16 with Hepatic encephalopathy, dehydration, cystitis and Proteus UTI. She had paracentesis and was placed on Bactrim for SBP prophylaxis. She had CIWA protocol initiated in the hospital. She was seen by psychiatric services for behavioral issues and was placed on risperidone. She was treated for UTI. Palliative care was consulted for for poor overall prognosis. She has PMH of alcoholic liver cirrhosis with  ascites and alcohol abuse.  Patient was admitted to this facility for short-term rehabilitation after the patient's recent hospitalization.  Patient has completed SNF rehabilitation and therapy has cleared the patient for discharge.   PAST MEDICAL HISTORY:  Past Medical History:  Diagnosis Date  . ADHD (attention deficit hyperactivity disorder)   . Anxiety   . Arthritis   . Ascites   . Bipolar disorder (HCC)   . Cirrhosis (HCC)   . Hypokalemia   . Migraine   . Multiple sclerosis (HCC)   . Osteoporosis      CURRENT MEDICATIONS: Reviewed  Patient's Medications  New Prescriptions   No medications on file  Previous Medications   ALBUTEROL (PROVENTIL HFA;VENTOLIN HFA) 108 (90 BASE) MCG/ACT INHALER    Inhale 2 puffs into the lungs every 6 (six)  hours as needed for wheezing or shortness of breath.   AMANTADINE (SYMMETREL) 100 MG CAPSULE    Take 1 capsule (100 mg total) by mouth 2 (two) times daily.   BACLOFEN (LIORESAL) 10 MG TABLET    Take 1 tablet (10 mg total) by mouth 3 (three) times daily as needed for muscle spasms.   FOLIC ACID (FOLVITE) 1 MG TABLET    Take 1 tablet (1 mg total) by mouth daily.   FUROSEMIDE (LASIX) 40 MG TABLET    Take 1 tablet (40 mg total) by mouth daily.   GABAPENTIN (NEURONTIN) 100 MG CAPSULE    Take 2 capsules (200 mg total) by mouth 3 (three) times daily.   LACTULOSE (CHRONULAC) 10 GM/15ML SOLUTION    Take 45 mLs (30 g total) by mouth 3 (three) times daily.   MELATONIN 3 MG TABS    Take 1 tablet (3 mg total) by mouth at bedtime as needed (sleep).   MULTIPLE VITAMIN (MULTIVITAMIN WITH MINERALS) TABS TABLET    Take 1 tablet by mouth daily.   NICOTINE (NICODERM CQ - DOSED IN MG/24 HOURS) 21 MG/24HR PATCH    Place 1 patch (21 mg total) onto the skin daily.   ONDANSETRON (ZOFRAN ODT) 4 MG DISINTEGRATING TABLET    4mg  ODT q4 hours prn nausea/vomit   PANTOPRAZOLE (PROTONIX) 40 MG TABLET    Take 1 tablet (40 mg total) by mouth daily.   POLYETHYLENE GLYCOL (MIRALAX / GLYCOLAX) PACKET    Take 17 g by mouth daily as needed for mild constipation.   POTASSIUM CHLORIDE 20 MEQ TBCR    Take 40 mEq by mouth daily.   RIFAXIMIN (XIFAXAN) 550 MG TABS TABLET    Take 1 tablet (550 mg total) by mouth 2 (two) times daily.   RISPERIDONE (RISPERDAL) 0.5 MG TABLET    Take 1 tablet (0.5 mg total) by mouth at bedtime.   RISPERIDONE (RISPERDAL) 0.5 MG TABLET    Take 1 tablet (0.5 mg total) by mouth 3 (three) times daily as needed (agitation).   SPIRONOLACTONE (ALDACTONE) 100 MG TABLET    Take 1 tablet (100 mg total) by mouth daily.   SULFAMETHOXAZOLE-TRIMETHOPRIM (BACTRIM DS,SEPTRA DS) 800-160 MG TABLET    Take 1 tablet by mouth daily.   THIAMINE 100 MG TABLET    Take 100 mg by mouth daily.   TOPIRAMATE (TOPAMAX) 100 MG TABLET    Take 1  tablet (100 mg total) by mouth daily.   UNABLE TO FIND    Med Name: Med pass 120 mL by mouth twice daily between meals  Modified Medications   No medications on file  Discontinued Medications   No medications on file  No Known Allergies   REVIEW OF SYSTEMS:  GENERAL: no change in appetite, no fatigue, no weight changes, no fever, chills or weakness EYES: Denies change in vision, dry eyes, eye pain, itching or discharge EARS: Denies change in hearing, ringing in ears, or earache NOSE: Denies nasal congestion or epistaxis MOUTH and THROAT: Denies oral discomfort, gingival pain or bleeding, pain from teeth or hoarseness   RESPIRATORY: no cough, SOB, DOE, wheezing, hemoptysis CARDIAC: no chest pain, edema or palpitations GI: no abdominal pain, diarrhea, constipation, heart burn, nausea or vomiting GU: Denies dysuria, frequency, hematuria, incontinence, or discharge PSYCHIATRIC: Denies feeling of depression or anxiety. No report of hallucinations, insomnia, paranoia, or agitation     PHYSICAL EXAMINATION  GENERAL APPEARANCE: Well nourished. In no acute distress. Obese SKIN:  Skin is warm and dry.  HEAD: Normal in size and contour. No evidence of trauma EYES: Lids open and close normally. No blepharitis, entropion or ectropion. PERRL. Conjunctivae are clear and sclerae are white. Lenses are without opacity EARS: Pinnae are normal. Patient hears normal voice tunes of the examiner MOUTH and THROAT: Lips are without lesions. Oral mucosa is moist and without lesions. Tongue is normal in shape, size, and color and without lesions NECK: supple, trachea midline, no neck masses, no thyroid tenderness, no thyromegaly LYMPHATICS: no LAN in the neck, no supraclavicular LAN RESPIRATORY: breathing is even & unlabored, BS CTAB CARDIAC: RRR, no murmur,no extra heart sounds, no edema GI: abdomen soft, normal BS, no masses, no tenderness, no hepatomegaly, no splenomegaly EXTREMITIES:  Able to  move X 4 extremities PSYCHIATRIC: Alert and oriented X 3. Affect and behavior are appropriate    LABS/RADIOLOGY: Labs reviewed: Basic Metabolic Panel:  Recent Labs  69/62/95 1021 10/19/15 0517 10/20/15 1600  01/25/16 0533  02/04/16 0440 02/04/16 1600 02/05/16 0450 02/06/16 0650  NA  --  136 135  < > 137  < > 135  --  131* 132*  K  --  3.7 3.1*  < > 3.7  < > 3.4*  --  3.3* 4.0  CL  --  104 106  < > 104  < > 99*  --  99* 102  CO2  --  25 23  < > 24  < > 24  --  27 25  GLUCOSE  --  93 94  < > 152*  < > 98  --  100* 104*  BUN  --  <5* <5*  < > <5*  < > <5*  --  <5* <5*  CREATININE  --  0.41* 0.62  < > 0.46  < > 0.55  --  0.48 0.51  CALCIUM  --  8.1* 8.1*  < > 8.1*  < > 8.2*  --  7.7* 8.1*  MG 1.9 2.1 1.9  < > 2.2  --  1.6* 1.7  --   --   PHOS 1.1* 3.3 3.2  --   --   --   --   --   --   --   < > = values in this interval not displayed. Liver Function Tests:  Recent Labs  01/25/16 2230 01/27/16 0536 01/29/16 1132 02/04/16 0440  AST 41 39  --  46*  ALT 17 17  --  17  ALKPHOS 141* 138*  --  138*  BILITOT 1.9* 1.9*  --  1.8*  PROT 5.9* 5.1*  --  5.5*  ALBUMIN 2.5* 2.1* 2.5* 2.1*    Recent Labs  12/12/15 1049 12/18/15 1125  01/22/16 2047  LIPASE 21 24 21     Recent Labs  11/04/15 1654 12/12/15 1123 01/22/16 2108  AMMONIA 50* 30 84*   CBC:  Recent Labs  10/15/15 1037  12/12/15 1049 12/18/15 1125  02/03/16 0444 02/05/16 0450 02/06/16 0650  WBC 9.0  < > 7.6 7.2  < > 6.6 6.9 6.4  NEUTROABS 6.8  --  6.0 5.0  --   --   --   --   HGB 13.2  < > 11.7* 11.6*  < > 10.5* 10.1* 10.4*  HCT 38.4  < > 35.4* 35.4*  < > 33.0* 31.2* 31.9*  MCV 94.3  < > 93.7 95.7  < > 91.7 88.4 89.1  PLT 170  < > 134* 116*  < > 156 149* 139*  < > = values in this interval not displayed. Lipid Panel:  Recent Labs  08/02/15 0619  HDL 36*   Cardiac Enzymes:  Recent Labs  10/15/15 1414 01/23/16 0132  CKTOTAL  --  26*  TROPONINI <0.03  --    CBG:  Recent Labs   06/01/15 2140 10/17/15 1149  GLUCAP 94 99      Dg Chest 2 View  Result Date: 01/25/2016 CLINICAL DATA:  Tachypnea.  Cough. EXAM: CHEST  2 VIEW COMPARISON:  Previous examinations, the most recent dated 01/22/2016. FINDINGS: The cardiac silhouette remains borderline enlarged. A prominent epicardial fat pad is again demonstrated with a small area of probable scarring in that region without significant change. Otherwise, the lungs are clear with mildly prominent interstitial markings. Diffuse osteopenia. IMPRESSION: No acute abnormality. Mild chronic interstitial lung disease and borderline cardiomegaly. Electronically Signed   By: Beckie Salts M.D.   On: 01/25/2016 08:40   Dg Abd 1 View  Result Date: 01/29/2016 CLINICAL DATA:  Abdominal distention EXAM: ABDOMEN - 1 VIEW COMPARISON:  01/25/2016.  01/23/2016 FINDINGS: Interval progression of diffuse gaseous bowel distention with a central predominance. Visualized bony anatomy is unremarkable. Telemetry leads overlie the lower chest. IMPRESSION: Gaseous bowel distention in the central abdomen compatible with ileus or evolving obstruction. Centralization of bowel loops suggests associated ascites. Electronically Signed   By: Kennith Center M.D.   On: 01/29/2016 16:57   Dg Abd 1 View  Result Date: 01/23/2016 CLINICAL DATA:  Hepatic cirrhosis.  Abdominal pain. EXAM: ABDOMEN - 1 VIEW COMPARISON:  None. FINDINGS: The bowel gas pattern is normal. No radio-opaque calculi or other significant radiographic abnormality are seen. IMPRESSION: No evidence of bowel obstruction or ileus. Electronically Signed   By: Lupita Raider, M.D.   On: 01/23/2016 16:21   US Abdomen Limited  Result Date: 01/23/2016 CLINICAL DATA:  Patient with a history of cirrhosis and recurrent abdominal ascites. Request was made today for paracentesis. EXAM: LIMITED ABDOMEN ULTRASOUND FOR ASCITES TECHNIQUE: Limited ultrasound survey for ascites was performed in all four abdominal  quadrants. COMPARISON:  None. FINDINGS: A minimal amount of ascites is noted throughout the abdomen. Due to bowel in close proximity to her peritoneum, there is no safe window in order to perform the paracentesis for fluid removal. IMPRESSION: Minimal ascites with unsafe window to perform paracentesis. Procedure not done. Read by: Barnetta Chapel, PA-C Electronically Signed   By: Gilmer Mor D.O.   On: 01/23/2016 12:31   US Paracentesis  Result Date: 01/31/2016 INDICATION: Recurrent ascites EXAM: ULTRASOUND-GUIDED PARACENTESIS COMPARISON:  Previous paracentesis MEDICATIONS: 10 cc 1% lidocaine COMPLICATIONS: None immediate. TECHNIQUE: Informed written consent was obtained from the patient after a discussion of the  risks, benefits and alternatives to treatment. A timeout was performed prior to the initiation of the procedure. Initial ultrasound scanning demonstrates a large amount of ascites within the right lower abdominal quadrant. The right lower abdomen was prepped and draped in the usual sterile fashion. 1% lidocaine with epinephrine was used for local anesthesia. Under direct ultrasound guidance, a 19 gauge, 7-cm, Yueh catheter was introduced. An ultrasound image was saved for documentation purposed. The paracentesis was performed. The catheter was removed and a dressing was applied. The patient tolerated the procedure well without immediate post procedural complication. FINDINGS: A total of approximately 3 liters of yellow fluid was removed. Samples were sent to the laboratory as requested by the clinical team. IMPRESSION: Successful ultrasound-guided paracentesis yielding 3 liters of peritoneal fluid. Read by Robet Leu [facility] Electronically Signed   By: Jolaine Click M.D.   On: 01/31/2016 12:04   US Paracentesis  Result Date: 01/26/2016 INDICATION: Alcoholic cirrhosis. Recurrent ascites. Request diagnostic and therapeutic paracentesis. EXAM: ULTRASOUND GUIDED RIGHT LOWER QUADRANT PARACENTESIS  MEDICATIONS: None. COMPLICATIONS: None immediate. PROCEDURE: Informed written consent was obtained from the patient after a discussion of the risks, benefits and alternatives to treatment. A timeout was performed prior to the initiation of the procedure. Initial ultrasound scanning demonstrates a large amount of ascites within the right lower abdominal quadrant. The right lower abdomen was prepped and draped in the usual sterile fashion. 1% lidocaine with epinephrine was used for local anesthesia. Following this, a Safe-T-Centesis catheter was introduced. An ultrasound image was saved for documentation purposes. The paracentesis was performed. The catheter was removed and a dressing was applied. The patient tolerated the procedure well without immediate post procedural complication. FINDINGS: A total of approximately 2.7 L of clear yellow fluid was removed. Samples were sent to the laboratory as requested by the clinical team. IMPRESSION: Successful ultrasound-guided paracentesis yielding 2.7 liters of peritoneal fluid. Read by: Brayton El PA-C Electronically Signed   By: Jolaine Click M.D.   On: 01/26/2016 10:29   Dg Chest Port 1 View  Result Date: 01/28/2016 CLINICAL DATA:  Shortness of breath EXAM: PORTABLE CHEST 1 VIEW COMPARISON:  01/27/2016 FINDINGS: The heart size and mediastinal contours are within normal limits. Both lungs are clear. The visualized skeletal structures are unremarkable. IMPRESSION: No active disease. Electronically Signed   By: Elige Ko   On: 01/28/2016 13:19   Dg Chest Port 1 View  Result Date: 01/28/2016 CLINICAL DATA:  56 year old female with increasing shortness of breath and wheezing. EXAM: PORTABLE CHEST 1 VIEW COMPARISON:  Chest radiograph dated 01/25/2016 FINDINGS: Minimal bibasilar densities, left greater right most likely atelectatic changes. Developing infiltrate is less likely but not entirely excluded. Clinical correlation is recommended. Mild diffuse  interstitial coarsening as seen on the prior radiograph. No pleural effusion or pneumothorax. The cardiac silhouette is within normal limits with no acute osseous pathology. IMPRESSION: Bibasilar densities, likely atelectatic changes. Developing infiltrates are not excluded. Electronically Signed   By: Elgie Collard M.D.   On: 01/28/2016 01:21   Dg Chest Port 1 View  Result Date: 01/22/2016 CLINICAL DATA:  56 y/o  F; shortness of breath. EXAM: PORTABLE CHEST 1 VIEW COMPARISON:  12/18/2015 chest radiograph FINDINGS: Stable cardiac silhouette within normal limits. Prominent epicardial fat pad. No focal consolidation. No pleural effusion. Bones are unremarkable. IMPRESSION: No active disease. Electronically Signed   By: Mitzi Hansen M.D.   On: 01/22/2016 23:48   Dg Abd Portable 1v  Result Date: 01/25/2016 CLINICAL DATA:  Abdominal distension decreased bowel sounds EXAM: PORTABLE ABDOMEN - 1 VIEW COMPARISON:  01/23/2016 FINDINGS: Scattered large and small bowel gas is noted. No obstructive changes are seen. No acute bony abnormality is noted. No mass lesion is seen. IMPRESSION: No acute abnormality noted. Electronically Signed   By: Alcide Clever M.D.   On: 01/25/2016 17:57    ASSESSMENT/PLAN:  Physical deconditioning - for home health PT and OT, for therapeutic strengthening exercises; fall precautions  Alcoholic liver cirrhosis with ascites - S/P paracentesis in the hospital, follow-up with GI, continue lactulose 10 g/15 mL take 45 mL = 30 g by mouth 3 times a day, Lasix 40 mg 1 tab by mouth daily, Spironolactone 100 mg 1 tab by mouth daily, Bactrim DS 1 tab by mouth daily until seen by GI, continue Xifaxan 550 mg 1 tab by mouth twice a day  Peripheral neuropathy - continue gabapentin 100 mg take 2 capsules= 200 mg by mouth 3 times daily  Alcohol abuse - continue folic acid 1 mg 1 tab by mouth daily, thiamine 100 mg 1 tab by mouth daily  Tobacco use - continue nicotine 21 mg/24  hour 1 patch daily for a total of 6 weeks (till 03/20/16)  Hypokalemia - continue KCl ER 20 meq  Take 2 tabs = 40 meq by mouth daily  Bipolar disorder - continue to be her roommate 100 mg 1 tab by mouth daily, risperidone 0.5 mg 1 tab by mouth daily at bedtime and 0.5 mg 1 tab by mouth 3 times a day when necessary till 02/20/16  GERD - continue pantoprazole 40 mg 1 tab by mouth daily  Thrombocytopenia - has advanced alcoholic liver cirrhosis, monitor for bleeding and bruising  Hyponatremia - Na 132, better  Anemia of chronic disease - hgb 10.4, stable   Multiple sclerosis - continue baclofen 10 mg 1 tab by mouth 3 times a day when necessary      I have filled out patient's discharge paperwork and written prescriptions.  Patient will receive home health PT, OT, ST, SW, Nursing and CNA.  DME provided:  None  Total discharge time: Less than 30 minutes  Discharge time involved coordination of the discharge process with social worker, nursing staff and therapy department. Medical justification for home health services verified.    Milbert Bixler C. Medina-Vargas - NP [employer] [phone]

## 2016-02-14 IMAGING — CR DG CHEST 2V
2 series · 2 of 2 positions shown · non-contrast
Comparison: 03/22/2012

CLINICAL DATA: Right mid/lower chest pain, cough

EXAM:
CHEST  2 VIEW

[PA]
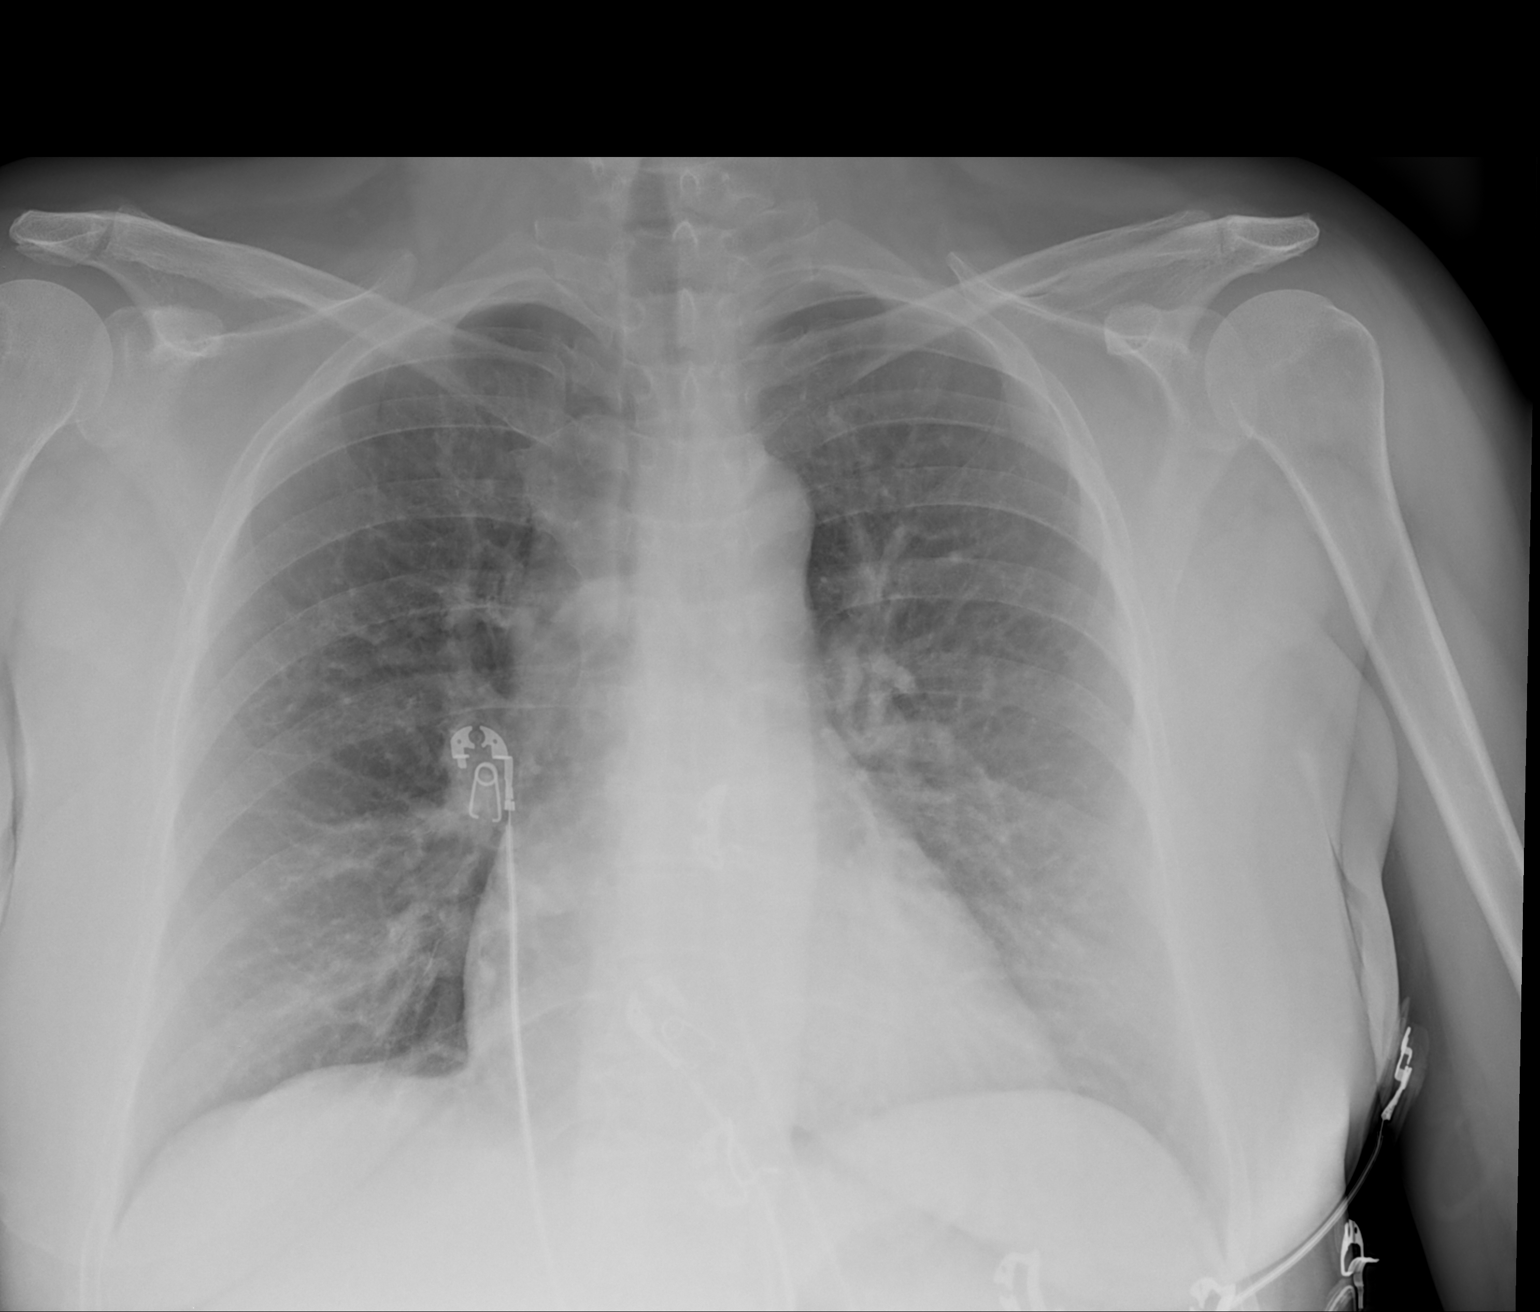

[w chest lat]
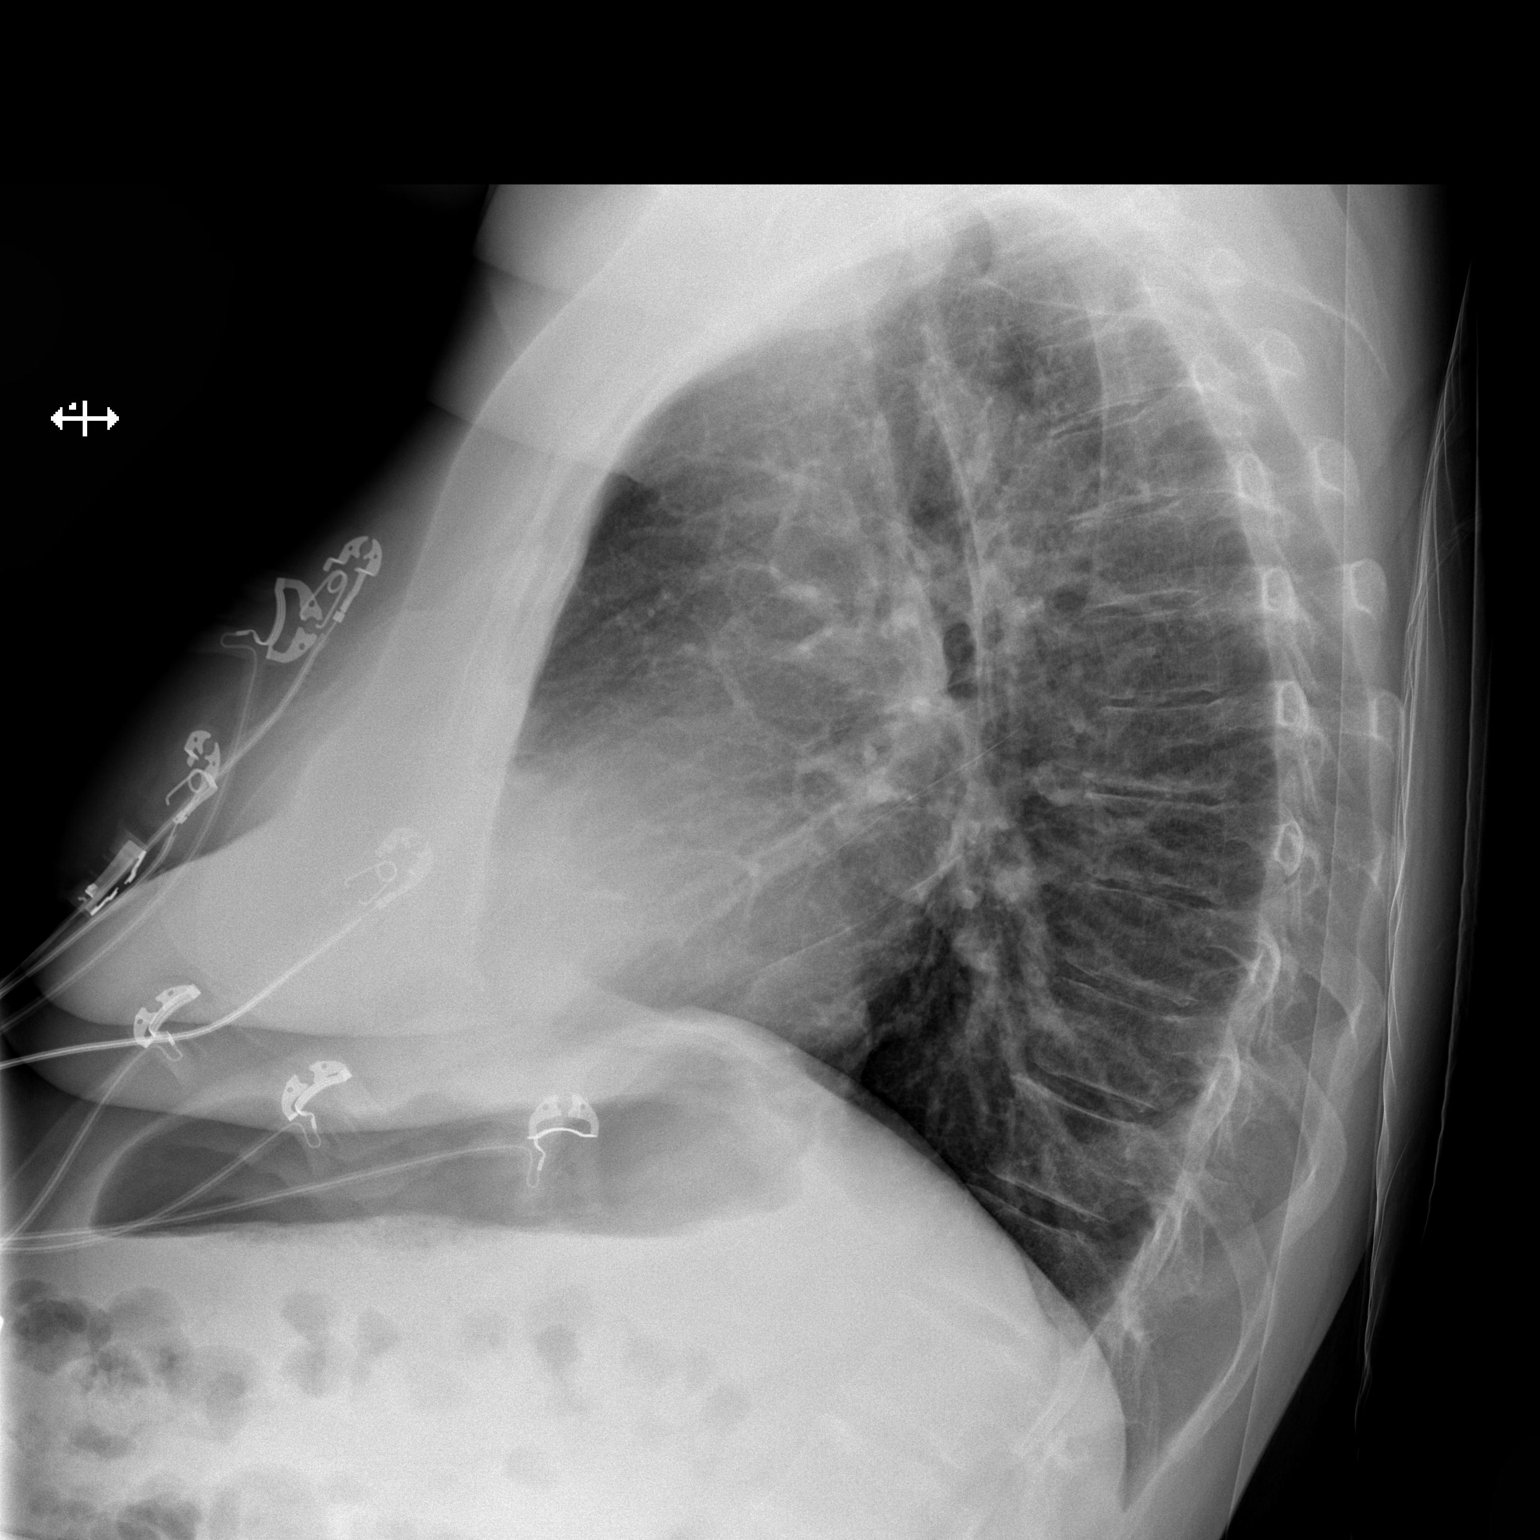

[2 of 2 positions shown; findings below may reference images not displayed]

FINDINGS: Lungs are essentially clear. No focal consolidation. No pleural
effusion or pneumothorax.

The heart is normal size.

Visualized osseous structures are within normal limits.
IMPRESSION: No evidence of acute cardiopulmonary disease.

## 2016-02-16 ENCOUNTER — Telehealth: Payer: Self-pay | Admitting: *Deleted

## 2016-02-16 NOTE — Telephone Encounter (Signed)
Pt called in requesting medication that wasn't prescribed by [place] or anyone else. I offered her an appt with a doctor and she declined. She stated that she was not going to come in just for [place] to tell her that we cant prescribe it and she wants an answer on the phone. PCP not available in office to talk with pt. She got angry and hung up the phone. Deseree Bruna Potter, CMA

## 2016-02-17 ENCOUNTER — Telehealth: Payer: Self-pay | Admitting: Internal Medicine

## 2016-02-17 NOTE — Telephone Encounter (Signed)
Pt has has refused home health services. ep

## 2016-02-17 NOTE — Telephone Encounter (Signed)
Will forward to MD to make her aware.  Ivanna Kocak,CMA  

## 2016-02-17 NOTE — Telephone Encounter (Signed)
Tried to call Anne Black's home/cell number [phone]; no answer and continued to ring. Therefore called number for [facility] [phone]. The staff member reported that Anne Black was discharged from [facility] on 02/14/16. The discharge note is pending currently.

## 2016-02-18 ENCOUNTER — Ambulatory Visit: Payer: Self-pay | Admitting: Internal Medicine

## 2016-02-19 ENCOUNTER — Telehealth: Payer: Self-pay | Admitting: Student

## 2016-02-19 NOTE — Telephone Encounter (Signed)
Called patient the response to the page I received on the after hour pager. Patient reports having pain from her multiple sclerosis flare-up. She likes to know if she can come to [facility] clinic tomorrow to get steroid injection or if I can call Norco in to her pharmacy. She states that gabapentin is not helping a lot. She stated that she is followed by rheumatologist at [facility]. However, she states that she is not able to drive to [facility] [address].  I recommended calling the clinic in the morning for SDA. If the pain is unbearable, I recommended coming to the emergency department for evaluation.  Patient voice understanding and agrees to do as advised.

## 2016-02-20 ENCOUNTER — Telehealth: Payer: Self-pay | Admitting: Family Medicine

## 2016-02-20 NOTE — Telephone Encounter (Signed)
Family Medicine After hours phone call  Patient is calling the after hours call line to request a prescription for "Antabuse". When inquired further she states that she is an alcoholic and would like this medication to help with her cravings. I asked if she had ever had this medication in the past and she stated that she had not. I informed her that Antabuse was not a medication that would be helpful in preventing feelings of craving alcohol but more effectively causes significant nausea after the consumption of alcohol. Patient related that she did not know this about this medication. I informed her that I was very hesitant to start any new medications without a office visit. She stated her understanding.  I informed her that at this time the most likely effective recommendation I could give were the numbers of 24-hour call lines who specialized in drug and alcohol abuse. She was very receptive to this recommendation. I provided her 2 24-hour numbers which she claims to have written down. She had no further questions at this time. And thanked me for these recommendations.  Kathee Delton, MD,MS,  PGY3 02/20/2016 8:33 PM

## 2016-02-23 ENCOUNTER — Telehealth: Payer: Self-pay | Admitting: Internal Medicine

## 2016-02-23 NOTE — Progress Notes (Signed)
   CC: Hernia and Generalized Itching  HPI: Anne Black is a 56 y.o. female with PMH alcoholic cirrhosis, alcohol use disorder, MS, biplar 1 disorder, who presents to [facility] today with generalized itching and a hernia.  Ventral Hernia  - Patient is unsure when she first noticed the ventral hernia - Not painful - +no diarrhea or constipation - no vomiting - no previous history of hernia - Does not remember how it came on - Soft, mildly tender when she pushes it in - concerned about appearance of abdomen/ability to wear swim suit in summer  Itching, generalized - onset one month ago - no new exposures, no new detergents, meds, foods, soaps - over entire body, constant - no known worsening or palliating factors  Review of Symptoms:  See HPI for ROS.   CC, SH/smoking status, and VS noted.  Objective: BP 120/76 (BP Location: Right Arm, Patient Position: Sitting, Cuff Size: Normal)   Pulse 89   Temp 97.3 F (36.3 C) (Oral)   Ht 5\' 2"  (1.575 m)   Wt 145 lb (65.8 kg)   SpO2 94%   BMI 26.52 kg/m  GEN: NAD, alert, cooperative NECK: full ROM, no thyromegally RESPIRATORY: clear to auscultation bilaterally with no wheezes, rhonchi or rales, good effort CV: RRR, no m/r/g, no peripheral edema GI: soft, non-tender, non-distended, normoactive bowel sounds, +easily reducible ventral hernia without discoloration or tenderness to palpation. SKIN: warm and dry, no rash, +numerous excoriations over abdomen consistent with scratching.  NEURO: II-XII grossly intact, normal gait, peripheral sensation intact PSYCH: AAOx3, appropriate affect  Assessment and plan:  Itching - Generalized itching, likely 2/2 liver cirrhosis. No rashes or lesions, no known new exposures, no new meds or foods. - will start cholestyramine to take BID with meals, can titrate up as needed   Ventral hernia without obstruction or gangrene - easily reducible, no obstruction - patient having normal bowel movements -  not good surgical candidate given history of cirrhosis - will monitor, strict return precautions advised - patient agreeable to this plan   Meds ordered this encounter  Medications  . cholestyramine light (PREVALITE) 4 g packet    Sig: Take 1 packet (4 g total) by mouth 2 (two) times daily. With meals    Dispense:  60 packet    Refill:  3     Howard Pouch, MD,MS,  PGY1 02/24/2016 8:06 PM

## 2016-02-23 NOTE — Telephone Encounter (Signed)
Pt would like something called in for anxiety. Pt states she is itching and unable to sleep. Offered to scheduled an appointment, pt declined. ep

## 2016-02-24 ENCOUNTER — Ambulatory Visit: Payer: Managed Care, Other (non HMO) | Admitting: Gastroenterology

## 2016-02-24 ENCOUNTER — Telehealth: Payer: Self-pay | Admitting: Student

## 2016-02-24 ENCOUNTER — Telehealth: Payer: Self-pay | Admitting: Internal Medicine

## 2016-02-24 ENCOUNTER — Encounter: Payer: Self-pay | Admitting: Student in an Organized Health Care Education/Training Program

## 2016-02-24 ENCOUNTER — Ambulatory Visit (INDEPENDENT_AMBULATORY_CARE_PROVIDER_SITE_OTHER): Payer: Managed Care, Other (non HMO) | Admitting: Student in an Organized Health Care Education/Training Program

## 2016-02-24 VITALS — BP 120/76 | HR 89 | Temp 97.3°F | Ht 62.0 in | Wt 145.0 lb

## 2016-02-24 DIAGNOSIS — K439 Ventral hernia without obstruction or gangrene: Secondary | ICD-10-CM | POA: Diagnosis not present

## 2016-02-24 DIAGNOSIS — L299 Pruritus, unspecified: Secondary | ICD-10-CM | POA: Diagnosis not present

## 2016-02-24 MED ORDER — CHOLESTYRAMINE LIGHT 4 G PO PACK: 4.0000 g | PACK | Freq: Two times a day (BID) | ORAL | 3 refills | Status: DC

## 2016-02-24 MED ORDER — HYDROXYZINE HCL 25 MG PO TABS: 25.0000 mg | ORAL_TABLET | Freq: Two times a day (BID) | ORAL | 0 refills | Status: DC | PRN

## 2016-02-24 NOTE — Patient Instructions (Addendum)
It was a pleasure seeing you today in our clinic. Today we discussed your itching and your hernia. Here is the treatment plan we have discussed and agreed upon together:  - Watch the hernia for increased pain, change in color or blood in the stool, as these would be reasons to call us back or come in to be seen. - Please take the medication I prescribed called cholestyramine for itching, twice daily with meals. - Please schedule a follow up to be seen by your PCP in one month, or sooner if needed.   Our clinic's number is [phone]. Please call with questions or concerns about what we discussed today.  Be well, Dr. Mosetta Putt

## 2016-02-24 NOTE — Telephone Encounter (Signed)
Will forward to PCP.  Martin, Tamika L, RN  

## 2016-02-24 NOTE — Assessment & Plan Note (Signed)
-   Generalized itching, likely 2/2 liver cirrhosis. No rashes or lesions, no known new exposures, no new meds or foods. - will start cholestyramine to take BID with meals, can titrate up as needed

## 2016-02-24 NOTE — Telephone Encounter (Signed)
Patient was seen in clinic today by another provider. Per chart review, her concerns were addressed.

## 2016-02-24 NOTE — Telephone Encounter (Signed)
Pt is wondering if the cirrhosis is causing her hair t gall out.  Please advise

## 2016-02-24 NOTE — Telephone Encounter (Signed)
Called patient in response to her page on an after hour pager. Patient was seen in clinic for generalized itching likely from liver cirrhosis. She was prescribed cholestyramine. She she started taking this medication. However, she asks if she can get something for pruritus to help with the itching with the itching quicker.  I sent a prescription for hydroxyzine 25 mg twice a day as needed for itching, #20. Patient was appreciative and had no further questions.

## 2016-02-24 NOTE — Assessment & Plan Note (Signed)
-   easily reducible, no obstruction - patient having normal bowel movements - not good surgical candidate given history of cirrhosis - will monitor, strict return precautions advised - patient agreeable to this plan

## 2016-03-01 ENCOUNTER — Ambulatory Visit (INDEPENDENT_AMBULATORY_CARE_PROVIDER_SITE_OTHER): Payer: Managed Care, Other (non HMO) | Admitting: Physician Assistant

## 2016-03-01 VITALS — BP 104/62 | HR 91 | Temp 97.9°F | Resp 18 | Wt 139.4 lb

## 2016-03-01 DIAGNOSIS — F419 Anxiety disorder, unspecified: Secondary | ICD-10-CM

## 2016-03-01 DIAGNOSIS — L299 Pruritus, unspecified: Secondary | ICD-10-CM

## 2016-03-01 MED ORDER — BUSPIRONE HCL 15 MG PO TABS: 15.0000 mg | ORAL_TABLET | Freq: Two times a day (BID) | ORAL | 0 refills | Status: DC

## 2016-03-01 MED ORDER — HYDROXYZINE HCL 25 MG PO TABS: 25.0000 mg | ORAL_TABLET | Freq: Two times a day (BID) | ORAL | 0 refills | Status: DC | PRN

## 2016-03-01 NOTE — Patient Instructions (Addendum)
1. Go to the 8 pm AA meeting in [place]. Go to your regular meeting tomorrow at 10 am. Ask for the names and numbers again. Consider attending 2 meetings each day for a while. You can go to as many meetings a day as you want. Attending meetings is a great way to reach out for the help that you need-both in the support of others who carry the same burden, suggestions for what to do, and maybe even help you get the clutter to the dump.  2. Be kind and generous with yourself. You cannot do it all, and certainly not alone. ASK for the help that you need. Don't rush it. The clutter will still be there tomorrow!  3. Call Dr. Deland Pretty office tomorrow and schedule the appointment. When you go, take ALL of your medications with you, in the bottles, so that she can determine if you need them all at this point.  Buspirone: The tablets are 15 mg each and are scored to be broken in half (7.5 mg) or in thirds (5 mg). Start by taking 5 mg (1/3 tablet) twice each day x 1 week. Then 7.5 mg (1/2 tablet) twice each day x 1 week. Then 10 mg (2/3 tablet) twice each day x 1 week. Then 12.5 mg (1/2 tablet + 1/3 tablet) twice each day x 1 week. Then 15 mg twice (1 whole tablet) each day x 1 week.     IF you received an x-ray today, you will receive an invoice from [facility]. Please contact [facility] at [phone] with questions or concerns regarding your invoice.   IF you received labwork today, you will receive an invoice from [place]. Please contact [facility] at [phone] with questions or concerns regarding your invoice.   Our billing staff will not be able to assist you with questions regarding bills from these companies.  You will be contacted with the lab results as soon as they are available. The fastest way to get your results is to activate your My Chart account. Instructions are located on the last page of this paperwork. If you have not heard from [place] regarding the results  in 2 weeks, please contact this office.

## 2016-03-01 NOTE — Progress Notes (Signed)
Patient ID: Anne Black, female    DOB: May 11, 1960, 56 y.o.   MRN: [id number]  PCP: [name], MD  Chief Complaint  Patient presents with  . Anxiety    Subjective:   Presents for evaluation of anxiety. I last saw her 03/2015 for a cough.  She has since been diagnosed with cirrhosis. She relates that today is 30-days sober. Today she has started thinking about alcohol and feeling anxious about that. Notes that she previously used alcohol to self medicate her anxiety symptoms. Would like to go back into treatment, but states that she doesn't meet criteria for admission because she hasn't had alcohol in the past 3 days.  Got frustrated with the clinic where her PCP is. "They have some rules that I don't think I'm going to be able to tolerate." Accessibility there is an issue. She was seen last week for itching, by a colleague of her PCP who was not available, and was prescribed cholestyramine which has helped, but because she was there for an acute visit, was told that she could only be seen for one issue. She was told she would have to schedule with her primary care provider to address the anxiety. The next available appointment with her PCP is next Monday. She declined that appointment and came here. The situation is feeling more pressing and urgent.  Previously attended treatment in [place], [place] x 3 weeks. Usually attends AA at 10 am M-F at the [facility] on [address], and sometimes attends a Saturday night speaker meeting, but notes that she hasn't been attending meetings as consistently. Lost her sponsor's number. Had previously asked for and received phone numbers from other meeting attendees willing to receive calls from her. Tried to establish community. But lost the list. Hasn't asked again.  Her triplet daughters celebrate their 24th birthday tomorrow. She has hired one of her daughters to help her clean out clutter from her home. Feeling overwhelmed  with the volume of things she needs to take to the dump, as well as feeling emotional going through her items. Feels "so incompetent right now." Minimal tolerance for frustration. Very irritable. Got lost coming here today, despite having lived in this area for decades. 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   No thoughts of suicide.    Review of Systems No chest pain, SOB, HA, dizziness, vision change, N/V, diarrhea, constipation, dysuria, urinary urgency or frequency, myalgias, arthralgias or rash.   Patient Active Problem List   Diagnosis Date Noted  . Itching 02/24/2016  . Ventral hernia without obstruction or gangrene 02/24/2016  . Palliative care encounter   . Ascites   . Tachypnea   . Typical atrial flutter (HCC)   . Hypomagnesemia   . Hypoxia   . Dehydration 01/22/2016  . Low back ache 12/30/2015  . Non-intractable vomiting with nausea   . Alcohol use disorder (HCC) 12/15/2015  . Acute alcoholism (HCC)   . Abdominal distension 11/04/2015  . Alcohol abuse   . Dyspnea   . Acute respiratory failure (HCC) 10/15/2015  . Ascites due to alcoholic cirrhosis (HCC) 10/15/2015  . Osteoporosis   . Bipolar I disorder (HCC)   . Multiple sclerosis (HCC)   . Liver disease due to alcohol (HCC) 08/22/2015  . Bipolar disorder, current episode mixed, moderate (HCC) 08/01/2015  . Alcohol use disorder, severe, dependence (HCC) 07/31/2015  . Macrocytic anemia- due to alcohol abuse with normal B12 & folate levels  05/31/2015  . Severe protein-calorie malnutrition (HCC) 05/31/2015  . Hypokalemia 05/26/2015  . Encephalopathy, hepatic (HCC) 05/26/2015  . Alcohol withdrawal (HCC) 05/18/2015  . Abdominal pain 05/18/2015  . Anxiety 05/18/2015  . Sepsis (HCC) 05/18/2015  . UTI (lower urinary tract infection) 05/18/2015  . Stimulant abuse 12/27/2014  . Nicotine abuse 12/27/2014  . Hyperprolactinemia (HCC) 11/15/2014  . Dyslipidemia   .  Noncompliance with therapeutic plan 04/04/2013  . Unspecified hereditary and idiopathic peripheral neuropathy 05/01/2012  . GERD (gastroesophageal reflux disease) 05/01/2012  . Osteoarthrosis, unspecified whether generalized or localized, involving lower leg 05/01/2012     Prior to Admission medications   Medication Sig Start Date End Date Taking? Authorizing Provider  albuterol (PROVENTIL HFA;VENTOLIN HFA) 108 (90 Base) MCG/ACT inhaler Inhale 2 puffs into the lungs every 6 (six) hours as needed for wheezing or shortness of breath.   Yes Historical Provider, MD  amantadine (SYMMETREL) 100 MG capsule Take 1 capsule (100 mg total) by mouth 2 (two) times daily. 08/04/15  Yes Oneta Rack, NP  baclofen (LIORESAL) 10 MG tablet Take 1 tablet (10 mg total) by mouth 3 (three) times daily as needed for muscle spasms. 10/24/15  Yes Garth Bigness, MD  cholestyramine light (PREVALITE) 4 g packet Take 1 packet (4 g total) by mouth 2 (two) times daily. With meals 02/24/16  Yes Howard Pouch, MD  folic acid (FOLVITE) 1 MG tablet Take 1 tablet (1 mg total) by mouth daily. 08/30/15  Yes Barnetta Chapel, MD  furosemide (LASIX) 40 MG tablet Take 1 tablet (40 mg total) by mouth daily. 08/30/15  Yes Barnetta Chapel, MD  gabapentin (NEURONTIN) 100 MG capsule Take 2 capsules (200 mg total) by mouth 3 (three) times daily. 01/07/16  Yes [name], MD  hydrOXYzine (ATARAX/VISTARIL) 25 MG tablet Take 1 tablet (25 mg total) by mouth 2 (two) times daily as needed for itching. 02/24/16  Yes Almon Hercules, MD  lactulose (CHRONULAC) 10 GM/15ML solution Take 45 mLs (30 g total) by mouth 3 (three) times daily. 02/06/16  Yes Renne Musca, MD  Melatonin 3 MG TABS Take 1 tablet (3 mg total) by mouth at bedtime as needed (sleep). 10/24/15  Yes Garth Bigness, MD  Multiple Vitamin (MULTIVITAMIN WITH MINERALS) TABS tablet Take 1 tablet by mouth daily. 08/30/15  Yes Barnetta Chapel, MD  nicotine (NICODERM CQ -  DOSED IN MG/24 HOURS) 21 mg/24hr patch Place 1 patch (21 mg total) onto the skin daily. 02/07/16  Yes Renne Musca, MD  ondansetron (ZOFRAN ODT) 4 MG disintegrating tablet 4mg  ODT q4 hours prn nausea/vomit 12/12/15  Yes Blane Ohara, MD  pantoprazole (PROTONIX) 40 MG tablet Take 1 tablet (40 mg total) by mouth daily. 12/30/15  Yes Alyssa A Haney, MD  polyethylene glycol (MIRALAX / GLYCOLAX) packet Take 17 g by mouth daily as needed for mild constipation. 10/16/15  Yes Garth Bigness, MD  potassium chloride 20 MEQ TBCR Take 40 mEq by mouth daily. 10/16/15  Yes Garth Bigness, MD  risperiDONE (RISPERDAL) 0.5 MG tablet Take 1 tablet (0.5 mg total) by mouth at bedtime. 08/04/15  Yes Oneta Rack, NP  risperiDONE (RISPERDAL) 0.5 MG tablet Take 1 tablet (0.5 mg total) by mouth 3 (three) times daily as needed (agitation). 02/06/16  Yes Renne Musca, MD  spironolactone (ALDACTONE) 100 MG tablet Take 1 tablet (100 mg total) by mouth daily. 08/30/15  Yes Barnetta Chapel, MD  sulfamethoxazole-trimethoprim (BACTRIM DS,SEPTRA DS) 800-160 MG tablet Take  1 tablet by mouth daily. 02/07/16  Yes Renne Musca, MD  thiamine 100 MG tablet Take 100 mg by mouth daily.   Yes Historical Provider, MD  topiramate (TOPAMAX) 100 MG tablet Take 1 tablet (100 mg total) by mouth daily. 01/07/16  Yes [name], MD  UNABLE TO FIND Med Name: Med pass 120 mL by mouth twice daily between meals   Yes Historical Provider, MD  rifaximin (XIFAXAN) 550 MG TABS tablet Take 1 tablet (550 mg total) by mouth 2 (two) times daily. 06/05/15   Catarina Hartshorn, MD     No Known Allergies     Objective:  Physical Exam  Constitutional: She is oriented to person, place, and time. She appears well-developed and well-nourished. She is active and cooperative. No distress.  BP 104/62 (Cuff Size: Normal)   Pulse 91   Temp 97.9 F (36.6 C)   Resp 18   Wt 139 lb 6.4 oz (63.2 kg)   SpO2 93%   BMI 25.50 kg/m   HENT:  Head:  Normocephalic and atraumatic.  Right Ear: Hearing normal.  Left Ear: Hearing normal.  Eyes: Conjunctivae are normal. No scleral icterus.  Neck: Normal range of motion. Neck supple. No thyromegaly present.  Cardiovascular: Normal rate, regular rhythm and normal heart sounds.   Pulses:      Radial pulses are 2+ on the right side, and 2+ on the left side.  Pulmonary/Chest: Effort normal and breath sounds normal.  Lymphadenopathy:       Head (right side): No tonsillar, no preauricular, no posterior auricular and no occipital adenopathy present.       Head (left side): No tonsillar, no preauricular, no posterior auricular and no occipital adenopathy present.    She has no cervical adenopathy.       Right: No supraclavicular adenopathy present.       Left: No supraclavicular adenopathy present.  Neurological: She is alert and oriented to person, place, and time. No sensory deficit.  Skin: Skin is warm, dry and intact. No rash noted. No cyanosis or erythema. Nails show no clubbing.  Psychiatric: She has a normal mood and affect. Her speech is normal and behavior is normal.           Assessment & Plan:   1. Anxiety High risk for relapse to alcohol and other substance abuse. Trial of Buspar. Follow-up with her PCP. Attend as many meetings as needed to keep from drinking. Ask again for numbers of other attendees who can provide support by phone when she is feeling like drinking. Re-establish with her sponsor or with a new sponsor. - busPIRone (BUSPAR) 15 MG tablet; Take 1 tablet (15 mg total) by mouth 2 (two) times daily.  Dispense: 60 tablet; Refill: 0  2. Itching Continue hydroxyzine as needed. - hydrOXYzine (ATARAX/VISTARIL) 25 MG tablet; Take 1 tablet (25 mg total) by mouth 2 (two) times daily as needed for itching.  Dispense: 20 tablet; Refill: 0   Fernande Bras, PA-C Physician Assistant-Certified Primary Care at [facility]

## 2016-03-02 ENCOUNTER — Other Ambulatory Visit
Admission: RE | Admit: 2016-03-02 | Discharge: 2016-03-02 | Disposition: A | Discharge status: Home / Self Care | Payer: Medicare Other | Source: Ambulatory Visit | Attending: Gastroenterology | Admitting: Gastroenterology

## 2016-03-02 ENCOUNTER — Ambulatory Visit (INDEPENDENT_AMBULATORY_CARE_PROVIDER_SITE_OTHER): Payer: Managed Care, Other (non HMO) | Admitting: Gastroenterology

## 2016-03-02 ENCOUNTER — Telehealth: Payer: Self-pay

## 2016-03-02 ENCOUNTER — Other Ambulatory Visit: Payer: Self-pay

## 2016-03-02 ENCOUNTER — Encounter: Payer: Self-pay | Admitting: Gastroenterology

## 2016-03-02 VITALS — BP 112/63 | HR 86 | Temp 98.0°F | Ht 62.0 in | Wt 141.0 lb

## 2016-03-02 DIAGNOSIS — K7031 Alcoholic cirrhosis of liver with ascites: Secondary | ICD-10-CM | POA: Diagnosis not present

## 2016-03-02 LAB — COMPREHENSIVE METABOLIC PANEL
ALBUMIN: 3.3 g/dL — AB (ref 3.5–5.0)
ALK PHOS: 92 U/L (ref 38–126)
ALT: 17 U/L (ref 14–54)
ANION GAP: 6 (ref 5–15)
AST: 31 U/L (ref 15–41)
BUN: <5 mg/dL — ABNORMAL LOW (ref 6–20)
CALCIUM: 8.9 mg/dL (ref 8.9–10.3)
CHLORIDE: 104 mmol/L (ref 101–111)
CO2: 27 mmol/L (ref 22–32)
CREATININE: 0.52 mg/dL (ref 0.44–1.00)
GFR calc Af Amer: >60 mL/min (ref >60)
GFR calc non Af Amer: >60 mL/min (ref >60)
GLUCOSE: 107 mg/dL — AB (ref 65–99)
Potassium: 3.4 mmol/L — ABNORMAL LOW (ref 3.5–5.1)
SODIUM: 137 mmol/L (ref 135–145)
Total Bilirubin: 1.2 mg/dL (ref 0.3–1.2)
Total Protein: 7.4 g/dL (ref 6.5–8.1)

## 2016-03-02 MED ORDER — FUROSEMIDE 40 MG PO TABS: 40.0000 mg | ORAL_TABLET | Freq: Every day | ORAL | 3 refills | Status: DC

## 2016-03-02 NOTE — Telephone Encounter (Signed)
Please call patient and let her know: I think cirrhosis is less likely the cause of her hair thinning. Hair thinning is sometimes due to not having a well balanced diet and stress among other things.

## 2016-03-02 NOTE — Telephone Encounter (Signed)
Pt informed and voiced understanding

## 2016-03-02 NOTE — Progress Notes (Signed)
Gastroenterology Consultation  Referring Provider:     [name], MD Primary Care Physician:  [name], MD Primary Gastroenterologist:  Dr. Servando Snare     Reason for Consultation:     Cirrhosis        HPI:   Anne Black is a 56 y.o. y/o female referred for consultation & management of Cirrhosis by Dr. [name], MD.  This patient comes to see me today after being discharged from multiple GI practices in the past.The patient was recently in the hospital with what appears to be hepatic encephalopathy.  The patient is presently on Lasix and Aldactone.  When asked why she was discharged from the other GI practices she reports that it was because she did not do her colonoscopy fast enough.  The patient is concerned about her distended abdomen and umbilical hernia.  She denies any black stools or bloody stools.  The patient has had an ultrasound of the abdomen that showed her to have a nodular liver consistent with cirrhosis.  Today she also comes in stating that she has only drank for approximately 1-1/2 years.  On looking back throughout her chart I find elevated alcohol levels as far back as 3 years ago.  She reports that she has been clean for 30 days.  I'm now seeing her because of her cirrhosis and to establish care with her.  Past Medical History:  Diagnosis Date  . ADHD (attention deficit hyperactivity disorder)   . Anxiety   . Arthritis   . Ascites   . Bipolar disorder (HCC)   . Cirrhosis (HCC)   . Hypokalemia   . Migraine   . Multiple sclerosis (HCC)   . Osteoporosis     Past Surgical History:  Procedure Laterality Date  . CESAREAN SECTION  [id number]  . MYRINGOTOMY WITH TUBE PLACEMENT Bilateral   . TONSILLECTOMY      Prior to Admission medications   Medication Sig Start Date End Date Taking? Authorizing Provider  albuterol (PROVENTIL HFA;VENTOLIN HFA) 108 (90 Base) MCG/ACT inhaler Inhale 2 puffs into the lungs every 6 (six) hours as needed for  wheezing or shortness of breath.   Yes Historical Provider, MD  amantadine (SYMMETREL) 100 MG capsule Take 1 capsule (100 mg total) by mouth 2 (two) times daily. 08/04/15  Yes Oneta Rack, NP  baclofen (LIORESAL) 10 MG tablet Take 1 tablet (10 mg total) by mouth 3 (three) times daily as needed for muscle spasms. 10/24/15  Yes Garth Bigness, MD  busPIRone (BUSPAR) 15 MG tablet Take 1 tablet (15 mg total) by mouth 2 (two) times daily. 03/01/16  Yes Chelle Jeffery, PA-C  cholestyramine light (PREVALITE) 4 g packet Take 1 packet (4 g total) by mouth 2 (two) times daily. With meals 02/24/16  Yes Howard Pouch, MD  folic acid (FOLVITE) 1 MG tablet Take 1 tablet (1 mg total) by mouth daily. 08/30/15  Yes Barnetta Chapel, MD  furosemide (LASIX) 40 MG tablet Take 1 tablet (40 mg total) by mouth daily. 03/02/16  Yes Midge Minium, MD  gabapentin (NEURONTIN) 100 MG capsule Take 2 capsules (200 mg total) by mouth 3 (three) times daily. 01/07/16  Yes [name], MD  hydrOXYzine (ATARAX/VISTARIL) 25 MG tablet Take 1 tablet (25 mg total) by mouth 2 (two) times daily as needed for itching. 03/01/16  Yes Chelle Jeffery, PA-C  lactulose (CHRONULAC) 10 GM/15ML solution Take 45 mLs (30 g total) by mouth 3 (three) times daily. 02/06/16  Yes Renne Musca, MD  Melatonin 3 MG TABS Take 1 tablet (3 mg total) by mouth at bedtime as needed (sleep). 10/24/15  Yes Garth Bigness, MD  Multiple Vitamin (MULTIVITAMIN WITH MINERALS) TABS tablet Take 1 tablet by mouth daily. 08/30/15  Yes Barnetta Chapel, MD  nicotine (NICODERM CQ - DOSED IN MG/24 HOURS) 21 mg/24hr patch Place 1 patch (21 mg total) onto the skin daily. 02/07/16  Yes Renne Musca, MD  ondansetron (ZOFRAN ODT) 4 MG disintegrating tablet 4mg  ODT q4 hours prn nausea/vomit 12/12/15  Yes Blane Ohara, MD  pantoprazole (PROTONIX) 40 MG tablet Take 1 tablet (40 mg total) by mouth daily. 12/30/15  Yes Alyssa A Haney, MD  polyethylene glycol (MIRALAX /  GLYCOLAX) packet Take 17 g by mouth daily as needed for mild constipation. 10/16/15  Yes Garth Bigness, MD  potassium chloride 20 MEQ TBCR Take 40 mEq by mouth daily. 10/16/15  Yes Garth Bigness, MD  rifaximin (XIFAXAN) 550 MG TABS tablet Take 1 tablet (550 mg total) by mouth 2 (two) times daily. 06/05/15  Yes Catarina Hartshorn, MD  risperiDONE (RISPERDAL) 0.5 MG tablet Take 1 tablet (0.5 mg total) by mouth at bedtime. 08/04/15  Yes Oneta Rack, NP  risperiDONE (RISPERDAL) 0.5 MG tablet Take 1 tablet (0.5 mg total) by mouth 3 (three) times daily as needed (agitation). 02/06/16  Yes Renne Musca, MD  spironolactone (ALDACTONE) 100 MG tablet Take 1 tablet (100 mg total) by mouth daily. 08/30/15  Yes Barnetta Chapel, MD  sulfamethoxazole-trimethoprim (BACTRIM DS,SEPTRA DS) 800-160 MG tablet Take 1 tablet by mouth daily. 02/07/16  Yes Renne Musca, MD  thiamine 100 MG tablet Take 100 mg by mouth daily.   Yes Historical Provider, MD  topiramate (TOPAMAX) 100 MG tablet Take 1 tablet (100 mg total) by mouth daily. 01/07/16  Yes [name], MD  UNABLE TO FIND Med Name: Med pass 120 mL by mouth twice daily between meals   Yes Historical Provider, MD    Family History  Problem Relation Age of Onset  . Heart disease Mother   . Heart disease Father   . Hypertension Father      Social History  Substance Use Topics  . Smoking status: Current Every Day Smoker    Packs/day: 0.75    Years: 20.00    Types: Cigarettes  . Smokeless tobacco: Never Used     Comment: is cutting back, not in the car, house  . Alcohol use 4.8 oz/week    2 Glasses of wine, 2 Cans of beer, 4 Standard drinks or equivalent per week     Comment: 10/2015   " I drink occasionaly "    Allergies as of 03/02/2016  . (No Known Allergies)    Review of Systems:    All systems reviewed and negative except where noted in HPI.   Physical Exam:  BP 112/63   Pulse 86   Temp 98 F (36.7 C) (Oral)   Ht 5\' 2"  (1.575 m)    Wt 141 lb (64 kg)   BMI 25.79 kg/m  No LMP recorded. Patient is postmenopausal. Psych:  Alert and cooperative. Normal mood and affect. General:   Alert,  Well-developed, well-nourished, pleasant and cooperative in NAD Head:  Normocephalic and atraumatic. Eyes:  Sclera clear, no icterus.   Conjunctiva pink. Ears:  Normal auditory acuity. Nose:  No deformity, discharge, or lesions. Mouth:  No deformity or lesions,oropharynx pink & moist. Neck:  Supple; no masses  or thyromegaly. Lungs:  Respirations even and unlabored.  Clear throughout to auscultation.   No wheezes, crackles, or rhonchi. No acute distress. Heart:  Regular rate and rhythm; no murmurs, clicks, rubs, or gallops. Abdomen:  Normal bowel sounds.  No bruits.  Soft, non-tender and non-distended without masses, Umbilical hernia noted.  No guarding or rebound tenderness.  Negative Carnett sign.  Positive for shifting dullness and ascites. Rectal:  Deferred.  Msk:  Symmetrical without gross deformities.  Good, equal movement & strength bilaterally. Pulses:  Normal pulses noted. Extremities:  No clubbing or edema.  No cyanosis.There is extremity wasting Neurologic:  Alert and oriented x3;  grossly normal neurologically. Skin:  Intact without significant lesions or rashes.  No jaundice. Lymph Nodes:  No significant cervical adenopathy. Psych:  Alert and cooperative. Normal mood and affect.  Imaging Studies: No results found.  Assessment and Plan:   LABRESHA MELLOR is a 56 y.o. y/o female Who reports that she has only used alcohol for a year and a half but it is obvious from the chart that she has been drinking for much longer than that.  The patient has imaging suggestive of cirrhosis and she has a physical exam consistent with ascites and cirrhosis with an umbilical hernia.  The patient will have her Lasix increased to twice a day to help manage her ascites.  The patient will stay on the Aldactone.  The patient will also be set up  for an EGD to look for varices and treat accordingly and she will be set up for colonoscopy because she has not had a colonoscopy in the past.  The patient also will have her blood checked for CMP and alpha-fetoprotein.  The patient has been explained the plan agrees with it.    Midge Minium, MD. Clementeen Graham   Note: This dictation was prepared with Dragon dictation along with smaller phrase technology. Any transcriptional errors that result from this process are unintentional.

## 2016-03-02 NOTE — Telephone Encounter (Signed)
hydoxyzine not covered by insurance needs PA [id number] Id [id number]

## 2016-03-03 ENCOUNTER — Telehealth: Payer: Self-pay

## 2016-03-03 LAB — AFP TUMOR MARKER: AFP-Tumor Marker: 2.4 ng/mL (ref 0.0–8.3)

## 2016-03-03 NOTE — Telephone Encounter (Signed)
Pt notified of lab results

## 2016-03-03 NOTE — Telephone Encounter (Signed)
-----   Message from Midge Minium, MD sent at 03/03/2016 10:42 AM EST ----- Let the patient know that her tumor marker was negative and her liver enzymes are normal.

## 2016-03-05 NOTE — Telephone Encounter (Signed)
Per pa no pa is needed, pharmacy advised

## 2016-03-08 ENCOUNTER — Telehealth: Payer: Self-pay | Admitting: Internal Medicine

## 2016-03-08 ENCOUNTER — Telehealth: Payer: Self-pay | Admitting: Student

## 2016-03-08 NOTE — Telephone Encounter (Signed)
21 

## 2016-03-08 NOTE — Telephone Encounter (Signed)
Pt called and would like to speak to one if the nurses for her doctor to ask a couple of questions concerning her issues, she wants to make an appointment but not if the nurse doesn't think she has too. jw

## 2016-03-08 NOTE — Telephone Encounter (Signed)
Spoke with pt and she stated she would like to just come in for an office visit.

## 2016-03-08 NOTE — Telephone Encounter (Signed)
Patient called reports continued generalized itching. She has been taking the cholestyramine as well as hydroxyzine asdirected and feels nothing is helping. She was directed to take 50 mg hydroxyzine tonight and call to make an appointment to follow with her PCP regarding this

## 2016-03-10 NOTE — Progress Notes (Signed)
[facility] Phone: [phone]   Date of Visit: 03/11/2016   HPI:  Itching:  - patient was initially seen in clinic for diffuse itching on 02/24/16 and was prescribed Cholestyramine  - has also been using Hydroxyzine 25mg  TID with limited relief  - reports she has been using this and only gotten little relief - reports itching is worse at night and worse with she is laying down  - she is unclear when symptoms started - reports it is diffuse - no history of eczema - has also been using baby oil after shower  Anxiety:  - reports that she has not seen outpatient psychiatry in years  - per chart review, she had a recent admission to [facility] in [facility] for alcohol treatment. She was discharged with Prozac 20mg  daily (depression), Gabapentin 400mg  TID ("mood"), Risperdal (0.5mg ) at bedtime ("mood"). Reports she is taking these medications.  - She was seen by FM at [facility] on 1/23 and was prescribed Buspar. Reports that she was on Buspar before and she knows this does not work  - reports these medications are not working  - reports she needs benzodiazepines.  - Per chart review, her problem list does include bipolar disorder but does not have further details about this. Additionally, care everywhere has Lamictal as a medication. She reports that the physician who first diagnosed her with this "did not know what he was talking about". She started to discuss how there was a lesion found in her brain by MRI and reported that that physician did not know "what I had and diagnosed me with bipolar". We discussed that we cannot diagnose bipolar disorder with imaging, and patient reports "I know that".  - per chart review, last seen by OUTPATIENT psych was 12/2014  Hx of ADHD:  - reports 3 years ago she used to be on Vyvanse for ADHD and she would like to go back on this.   Patient did not bring her medications to the visit as recommended.   ROS: See  HPI.  PMFSH:  PMH: Typical Atrial Flutter GERD Alcoholic Cirrhosis of Liver with Recurrent Ascites Hyperprolactinemia Unspecified Peripheral Neuropathy Multiple Sclerosis Osteoporosis Alcohol Use Disorder Dyslipidemia Tobacco Use Anxiety Bipolar Disorder Severe Protein Calorie Malnutrition Macrocytic Anemia Ventral Hernia without Obstruction/Gangrene ADHD   PHYSICAL EXAM: BP 110/64   Pulse 79   Temp 97.8 F (36.6 C) (Oral)   Ht 5\' 2"  (1.575 m)   Wt 64.5 kg (142 lb 3.2 oz)   SpO2 98%   BMI 26.01 kg/m  GEN: NAD CV: RRR, no murmurs, rubs, or gallops PULM: CTAB, normal effort ABD: Soft, nontender, distended with umbilical hernia that is easily reducible, NABS. Positive for ascites.  SKIN: No primary rash but note excoriations and with small superficial wounds on the right anterior shoulder from scratching without signs of infection. Palmar and planter surfaces not affected; webs of fingers normal. Mild dry skin on back and legs. No cyanosis; warm and well-perfused EXTR: No lower extremity edema or calf tenderness PSYCH: Mood and affect euthymic, normal rate and volume of speech NEURO: Awake, alert, no focal deficits grossly, normal speech  ASSESSMENT/PLAN:  Itching Liver from liver cirrhosis with component of dry skin. Plan to increase Cholestyramine to 4g TID (from BID). Also increase Hydroxyzine to 50mg  TID (patient reports no drowsiness with 25mg  dose). Discussed importance of skin hydration with emollients such as Eucerin multiple times a day.   Anxiety Requesting benzodiazepine for anxiety and itching.  Currently on Prozac and was recently started on Buspar. She also has Hydroxyzine PRN which we will increase today. Discussed with preceptor; benzodiazepine is unsafe. Per chart review, she has a history of bipolar disorder. She will benefit from establishing care with psychiatry again to ensure she is on the right medications or that she has the correct diagnosis.     Discussed with preceptor Dr. Leveda Anna. Advised to refrain from use of Benzodiazepine. Additionally starting a stimulant would likely worsen anxiety. Additionally, per chart review, she has stimulant abuse in her problem list. We discussed that she would benefit from pharmacy clinic visit with Dr. Raymondo Band to ensure there are not drug interactions.   Patient was argumentative insisting that she needs a benzodiazepine and her Vyvanse refilled today. Reports that I am not listening to her and that she knows that this is what she needs. I stated that it would be poor medical care for me to prescribe her those medications. She would initially report that she would follow the above recommendations then reports she would not. Spent over an 1 hour discussing the above with patient, and near the end she walked out. I asked the nurse to give her AVS with the summary of what we talked about (note AVS).   [name], MD PGY 2 [facility]

## 2016-03-11 ENCOUNTER — Encounter: Payer: Self-pay | Admitting: Internal Medicine

## 2016-03-11 ENCOUNTER — Ambulatory Visit (INDEPENDENT_AMBULATORY_CARE_PROVIDER_SITE_OTHER): Payer: Managed Care, Other (non HMO) | Admitting: Internal Medicine

## 2016-03-11 VITALS — BP 110/64 | HR 79 | Temp 97.8°F | Ht 62.0 in | Wt 142.2 lb

## 2016-03-11 DIAGNOSIS — L299 Pruritus, unspecified: Secondary | ICD-10-CM

## 2016-03-11 DIAGNOSIS — F419 Anxiety disorder, unspecified: Secondary | ICD-10-CM

## 2016-03-11 DIAGNOSIS — Z8659 Personal history of other mental and behavioral disorders: Secondary | ICD-10-CM | POA: Diagnosis not present

## 2016-03-11 MED ORDER — CHOLESTYRAMINE LIGHT 4 G PO PACK: 4.0000 g | PACK | Freq: Three times a day (TID) | ORAL | 3 refills | Status: DC

## 2016-03-11 MED ORDER — HYDROXYZINE HCL 50 MG PO TABS: 50.0000 mg | ORAL_TABLET | Freq: Three times a day (TID) | ORAL | 0 refills | Status: DC | PRN

## 2016-03-11 NOTE — Patient Instructions (Signed)
Please follow up in 4 weeks for anxiety  We make a referral to psychiatry  Please make an appointment with pharmacy clinic for medication management  Let's increase your cholestyramine to three times a day Let's increase your hydroxyzine to 50mg  three times a day as needed for itching and anxiety  Please try Eucerin multiple times throughout the day to keep your skin hydrated.

## 2016-03-12 ENCOUNTER — Telehealth: Payer: Self-pay | Admitting: Internal Medicine

## 2016-03-12 NOTE — Telephone Encounter (Signed)
Will forward to MD to advise. Jazmin Hartsell,CMA  

## 2016-03-12 NOTE — Assessment & Plan Note (Addendum)
Requesting benzodiazepine for anxiety and itching. Currently on Prozac and was recently started on Buspar. She also has Hydroxyzine PRN which we will increase today. Discussed with preceptor; benzodiazepine is unsafe. Per chart review, she has a history of bipolar disorder. She will benefit from establishing care with psychiatry again to ensure she is on the right medications or that she has the correct diagnosis.  Referral made to psychiatry.

## 2016-03-12 NOTE — Telephone Encounter (Signed)
Pt wants to know if there is prescription called timarail (sp?) for itching.  Since the itching is keeping her up at night, could something for sleep be called in? melotonin and benedryl are not working.  [facility] in [place]

## 2016-03-12 NOTE — Assessment & Plan Note (Signed)
Liver from liver cirrhosis with component of dry skin. Plan to increase Cholestyramine to 4g TID (from BID). Also increase Hydroxyzine to 50mg  TID (patient reports no drowsiness with 25mg  dose). Discussed importance of skin hydration with emollients such as Eucerin multiple times a day.

## 2016-03-16 ENCOUNTER — Telehealth: Payer: Self-pay | Admitting: Gastroenterology

## 2016-03-16 NOTE — Telephone Encounter (Signed)
Please let patient know that I have not heard of that cream for itching. I would recommend Eucerin or Aquaphor cream.

## 2016-03-16 NOTE — Telephone Encounter (Signed)
Left detailed message for pt to use OTC Eucerin cream for her itching and to call the clinic if she has any questions.

## 2016-03-16 NOTE — Telephone Encounter (Signed)
Patient left a voice message that she would like to talk to you. °

## 2016-03-17 NOTE — Telephone Encounter (Signed)
LVM for pt to return my call.

## 2016-03-18 NOTE — Telephone Encounter (Signed)
Pt wanted to discuss the possibility of a liver transplant. Advised her they do require a referral but gave her the numbers to [facility] and [facility] liver clinics per her request. Advised her if she would like a referral, she would need a follow up with Dr. Servando Snare to discuss.

## 2016-03-26 ENCOUNTER — Telehealth: Payer: Self-pay

## 2016-03-26 NOTE — Telephone Encounter (Signed)
Let the patient know that her liver enzymes are normal therefore her liver is unlikely the cause of her itching and she should talk to her primary care provider about her itching since her bilirubin and alkaline phosphatase is normal.

## 2016-03-26 NOTE — Telephone Encounter (Signed)
Pt called today stating she has been itching really bad especially in the evenings. Pt has been taking Benadryl daily to help with this but its not working very well. She has been using lotion daily as well Aveeno bath. She has alcoholic cirrhosis. Is there anything else she can do for this?

## 2016-03-29 NOTE — Telephone Encounter (Signed)
LVM for pt to return my call.

## 2016-03-30 ENCOUNTER — Other Ambulatory Visit: Payer: Self-pay

## 2016-03-30 MED ORDER — ONDANSETRON 4 MG PO TBDP: 4.0000 mg | ORAL_TABLET | Freq: Three times a day (TID) | ORAL | 1 refills | Status: DC | PRN

## 2016-03-30 NOTE — Telephone Encounter (Signed)
Pt advised to contact pcp to schedule an appt to discuss itching.

## 2016-04-18 ENCOUNTER — Other Ambulatory Visit: Payer: Self-pay | Admitting: Physician Assistant

## 2016-04-18 DIAGNOSIS — F419 Anxiety disorder, unspecified: Secondary | ICD-10-CM

## 2016-04-22 ENCOUNTER — Telehealth: Payer: Self-pay | Admitting: Family Medicine

## 2016-04-22 NOTE — Telephone Encounter (Signed)
Pt wanting a refill on Busbar it looks like another provider from another practice already filled this RX on 04-19-16 please respond

## 2016-04-22 NOTE — Telephone Encounter (Signed)
Authorized by Dr. Ottie Glazier on 04/19/2016

## 2016-04-23 ENCOUNTER — Telehealth: Payer: Self-pay | Admitting: Family Medicine

## 2016-04-23 NOTE — Telephone Encounter (Signed)
**  After Hours/ Emergency Line Call*  Received a call to report that Anne Black is having severe skin problem. She can't sleep because of this. She has scratches all down her right arm and they are bleeding. She is itching a lot. She has "literally almost lost her mind". The cholestyramine is not working. Today, she bought an anti-fungal spray. She states she has been seen twice in our clinic for this and "it's just the same shit, different day". She is very upset that she is not receiving appropriate education about what to expect during the course of this skin illness. I offered her a clinic appointment, but she stated "well I don't want to come and waste another ounce of time time on you guys because I feel like you just want me to suffer." Patient stated that she would only come to clinic if I could 100% guarantee that her suffering would be cured after coming to the appointment. I told her that I could not guarantee that one visit would cause her skin problem to completely resolve. She seemed frustrated and upset and she hung up the phone.  Will forward to PCP.  Hilton Sinclair, MD PGY-2, [facility]

## 2016-05-05 ENCOUNTER — Other Ambulatory Visit: Payer: Self-pay | Admitting: Internal Medicine

## 2016-05-05 NOTE — Telephone Encounter (Signed)
Pt is asking about antifungal OTC for her skin.  She is wanting to talk to a nurse about this. She wonders if there is a pill she can take or would it be a cream? She hasnt been seen by a dr for this problem.  At her last visit, her dr missed it---per pt

## 2016-05-05 NOTE — Telephone Encounter (Signed)
ptcalling to request refill of:  Name of Medication(s):  Risperidone Last date of OV: 03-11-16 Pharmacy: [facility] [place]   Will route refill request to Clinic RN.  Discussed with patient policy to call pharmacy for future refills.  Also, discussed refills may take up to 48 hours to approve or deny.  Avanell Shackleton

## 2016-05-05 NOTE — Telephone Encounter (Signed)
Will forward to PCP regarding antifungal cream/pillMartin, Bronson Ing, RN

## 2016-05-06 MED ORDER — RISPERIDONE 0.5 MG PO TABS: 0.5000 mg | ORAL_TABLET | Freq: Every day | ORAL | 0 refills | Status: DC

## 2016-05-06 NOTE — Telephone Encounter (Signed)
Attempted to contact pt- no answer and no option for VM for her VM memory was full. If pt calls back please schedule an apt with PCP.

## 2016-05-06 NOTE — Telephone Encounter (Signed)
Risperdal refilled. However it would be very helpful for patient to make an appointment for medication management so we can review her medications carefully. It is important that she brings all of her medications to this visit. She also needs to be seen for her skin concerns before we can make further recommendations regarding this. Please let patient know.

## 2016-05-08 ENCOUNTER — Telehealth: Payer: Self-pay | Admitting: Physician Assistant

## 2016-05-08 NOTE — Telephone Encounter (Signed)
Pt would like a referral to get a hernia removed. I let her know she may need to have an OV for this. Please advise at [phone]

## 2016-05-10 NOTE — Telephone Encounter (Signed)
Needs ov.  No answer

## 2016-05-12 DIAGNOSIS — K429 Umbilical hernia without obstruction or gangrene: Secondary | ICD-10-CM | POA: Insufficient documentation

## 2016-05-14 NOTE — Telephone Encounter (Signed)
Advised pt that she would need an ov pt asked why and just hung up

## 2016-05-17 ENCOUNTER — Telehealth: Payer: Self-pay | Admitting: Gastroenterology

## 2016-05-17 NOTE — Telephone Encounter (Signed)
Patient is ready to schedule her egd and colonoscopy. She would like to schedule it for Wed. 4/11 if possible.

## 2016-05-18 ENCOUNTER — Ambulatory Visit: Payer: Medicare Other | Admitting: Surgery

## 2016-05-18 NOTE — Telephone Encounter (Signed)
LVM for pt to return my call.

## 2016-05-20 ENCOUNTER — Telehealth: Payer: Self-pay

## 2016-05-20 NOTE — Telephone Encounter (Signed)
Left vm again today for pt to return my call.  

## 2016-05-20 NOTE — Telephone Encounter (Signed)
Returned phone call to patient at this time. She states that she has a hard lump at her umbilicus that is red and causing her severe abdominal pain. This has been this way since yesterday. She states that her pain is now a 4/10 at her umbilicus. Positive for nausea but denies vomiting. Denies fever and diarrhea. Always constipated per patient. She also states that this was draining fluid 2 days ago but is not currently.  She was instructed how to reduce the hernia over the phone and was told if she does  Not get this reduced within 45 minutes to 1 hour, she will need to go to the Emergency Department. Patient verbalizes understanding.

## 2016-05-20 NOTE — Telephone Encounter (Signed)
Patient is calling back because she has pushed the tissue back through her hernia but there is a hole where it was. I advised the patient to go to the ER. She asked why she needed to go if she managed to push it in. I told her because she now has a hole and we don't want it to get infected. She wants to be seen in the office today or tomorrow. I informed her that we do not have anything open for these next couple of days and that she needs to go to the ER. Patient understood.

## 2016-05-20 NOTE — Telephone Encounter (Signed)
Patient has an appointment on 05/25/16 with Dr. Earlene Plater to discuss her umbilical hernia. She had some scabs around her hernia that have come off. She states that there must have been an opening because some of her internal tissue, red in color, is protruding out of the opening. She is experiencing diarrhea and a lot of abdominal pain since last night and her abdominal pain seems to be getting worse. She describes it at a pain level of 8. The area is tender to the touch. Patient denies fever, nausea, chills, and vomiting but is concerned of an infection. Please call patient and advice.

## 2016-05-25 ENCOUNTER — Ambulatory Visit: Payer: Medicare Other | Admitting: Surgery

## 2016-05-26 ENCOUNTER — Ambulatory Visit (INDEPENDENT_AMBULATORY_CARE_PROVIDER_SITE_OTHER): Payer: 59 | Admitting: Surgery

## 2016-05-26 ENCOUNTER — Encounter: Payer: Self-pay | Admitting: Surgery

## 2016-05-26 VITALS — BP 98/65 | HR 97 | Temp 98.7°F | Ht 62.0 in | Wt 135.0 lb

## 2016-05-26 DIAGNOSIS — K429 Umbilical hernia without obstruction or gangrene: Secondary | ICD-10-CM | POA: Diagnosis not present

## 2016-05-26 DIAGNOSIS — K7031 Alcoholic cirrhosis of liver with ascites: Secondary | ICD-10-CM | POA: Diagnosis not present

## 2016-05-26 NOTE — Progress Notes (Signed)
Surgical Clinic History and Physical  Referring provider:  [name], MD [address] [place], [place] [address]  HISTORY OF PRESENT ILLNESS (HPI):  56 y.o. female with chronic ascites secondary to alcoholic cirrhosis presents for evaluation of an umbilical hernia x 1 - 2 years that she reports bulges when she feels bloated and when she does not where her elastic abdominal binder. She denies significant peri-umbilical pain, but expresses concern about enlargement of her hernia over the past 2 years. Patient otherwise reports she ceased alcohol consumption for the second time this past December, 2017, denies fever/chills, CP, or SOB, and has had her ascites overall medically controlled with diuretics since her last of many paracenteses performed between 08/23/2015 and 01/31/2016.  PAST MEDICAL HISTORY (PMH):  Past Medical History:  Diagnosis Date  . ADHD (attention deficit hyperactivity disorder)   . Anxiety   . Arthritis   . Ascites   . Bipolar disorder (HCC)   . Cirrhosis (HCC)   . Hypokalemia   . Migraine   . Multiple sclerosis (HCC)   . Osteoporosis      PAST SURGICAL HISTORY (PSH):  Past Surgical History:  Procedure Laterality Date  . CESAREAN SECTION  [id number]  . MYRINGOTOMY WITH TUBE PLACEMENT Bilateral   . TONSILLECTOMY       MEDICATIONS:  Prior to Admission medications   Medication Sig Start Date End Date Taking? Authorizing Provider  albuterol (PROVENTIL HFA;VENTOLIN HFA) 108 (90 Base) MCG/ACT inhaler Inhale 2 puffs into the lungs every 6 (six) hours as needed for wheezing or shortness of breath.   Yes Historical Provider, MD  amantadine (SYMMETREL) 100 MG capsule Take 1 capsule (100 mg total) by mouth 2 (two) times daily. 08/04/15  Yes Oneta Rack, NP  busPIRone (BUSPAR) 15 MG tablet TAKE 1 TABLET(15 MG) BY MOUTH TWICE DAILY 04/19/16  Yes [name], MD  cholestyramine light (PREVALITE) 4 g packet Take 1 packet (4 g total) by mouth 3 (three)  times daily. With meals 03/11/16  Yes [name], MD  FLUoxetine (PROZAC) 20 MG capsule Take 20 mg by mouth 2 (two) times daily. 03/30/16  Yes Historical Provider, MD  Fluticasone-Salmeterol (ADVAIR) 100-50 MCG/DOSE AEPB Inhale 2 Inhalers into the lungs 2 (two) times daily. 11/05/14  Yes Historical Provider, MD  furosemide (LASIX) 40 MG tablet Take 1 tablet (40 mg total) by mouth daily. 03/02/16  Yes Midge Minium, MD  gabapentin (NEURONTIN) 100 MG capsule Take 2 capsules (200 mg total) by mouth 3 (three) times daily. 01/07/16  Yes [name], MD  hydrocortisone 2.5 % cream Apply 1 application topically as needed. 04/26/16  Yes Historical Provider, MD  hydrOXYzine (ATARAX/VISTARIL) 50 MG tablet Take 1 tablet (50 mg total) by mouth 3 (three) times daily as needed for itching. 03/11/16  Yes [name], MD  Melatonin 3 MG TABS Take 1 tablet (3 mg total) by mouth at bedtime as needed (sleep). 10/24/15  Yes Garth Bigness, MD  ondansetron (ZOFRAN ODT) 4 MG disintegrating tablet Take 1 tablet (4 mg total) by mouth every 8 (eight) hours as needed for nausea or vomiting. 4mg  ODT q4 hours prn nausea/vomit 03/30/16  Yes Midge Minium, MD  pantoprazole (PROTONIX) 40 MG tablet Take 1 tablet (40 mg total) by mouth daily. 12/30/15  Yes Alyssa A Kennon Rounds, MD  rifaximin (XIFAXAN) 550 MG TABS tablet Take 1 tablet (550 mg total) by mouth 2 (two) times daily. 06/05/15  Yes Catarina Hartshorn, MD  risperiDONE (RISPERDAL) 0.5  MG tablet Take 1 tablet (0.5 mg total) by mouth 3 (three) times daily as needed (agitation). 02/06/16  Yes Renne Musca, MD  spironolactone (ALDACTONE) 100 MG tablet Take 1 tablet (100 mg total) by mouth daily. 08/30/15  Yes Barnetta Chapel, MD  thiamine 100 MG tablet Take 100 mg by mouth daily.   Yes Historical Provider, MD  topiramate (TOPAMAX) 100 MG tablet Take 1 tablet (100 mg total) by mouth daily. 01/07/16  Yes [name], MD  tretinoin (RETIN-A) 0.025 % gel Apply 1  application topically Nightly. 04/23/16  Yes Historical Provider, MD  VYVANSE 60 MG capsule Take 1 capsule by mouth once as needed. 05/12/16  Yes Historical Provider, MD     ALLERGIES:  No Known Allergies   SOCIAL HISTORY:  Social History   Social History  . Marital status: Divorced    Spouse name: n/a  . Number of children: 5  . Years of education: College   Occupational History  . disabled     on social security disability for MS   Social History Main Topics  . Smoking status: Former Smoker    Packs/day: 0.75    Years: 20.00    Types: Cigarettes    Quit date: 05/12/2016  . Smokeless tobacco: Never Used     Comment: Currently Vaping Daily  . Alcohol use No     Comment: Heavy ETOH Use in Past- Last Drink 01/23/16  . Drug use: No  . Sexual activity: Not Currently   Other Topics Concern  . Not on file   Social History Narrative   Former Environmental consultant.   Started graduate school in counseling, but didn't finish when a good job came open.   Lives alone.   One daughter lives in [place].   Another daughter is in [place].   The other three live in [place], along with her mother.   Father lives in the [place], [place] area.    The patient currently resides (home / rehab facility / nursing home): Home  The patient normally is (ambulatory / bedbound) : Ambulatory   FAMILY HISTORY:  Family History  Problem Relation Age of Onset  . Arrhythmia Mother   . Heart disease Father   . Hypertension Father     Otherwise negative/non-contributory.  REVIEW OF SYSTEMS:  Constitutional: denies any other weight loss, fever, chills, or sweats  Eyes: denies any other vision changes, history of eye injury  ENT: denies sore throat, hearing problems  Respiratory: denies shortness of breath, wheezing  Cardiovascular: denies chest pain, palpitations  Gastrointestinal: abdominal pain as per HPI, denies N/V or diarrhea Musculoskeletal: denies any other joint pains or cramps   Skin: reports generalized itching, denies any other rashes or skin discolorations  Neurological: denies any other headache, dizziness, weakness  Psychiatric: denies any other depression, anxiety   All other review of systems were otherwise negative   VITAL SIGNS:  @VSRANGES @     Height: 5\' 2"  (157.5 cm) Weight: 135 lb (61.2 kg) BMI (Calculated): 24.7   PHYSICAL EXAM:  Constitutional:  -- Normal body habitus  -- Awake, alert, and oriented x3  Eyes:  -- Pupils equally round and reactive to light  -- No scleral icterus  Ear, nose, throat:  -- No jugular venous distension -- No nasal drainage, bleeding Pulmonary:  -- No crackles  -- Equal breath sounds bilaterally -- Breathing non-labored at rest Cardiovascular:  -- S1, S2 present  -- No pericardial rubs  Gastrointestinal:  -- Abdomen soft  with moderate distention attributable to moderate hepatic ascites, no guarding/rebound  -- 4 cm x 4 cm umbilical hernia with small superficial crusted infraumbilical ulceration without erythema or drainage  Musculoskeletal and Integumentary:  -- Wounds or skin discoloration: Only as described by GI section above -- Extremities: B/L UE and LE FROM, hands and feet warm Neurologic:  -- Motor function: Intact and symmetric -- Sensation: Intact and symmetric  Labs:  CBC:  Lab Results  Component Value Date   WBC 6.4 02/06/2016   RBC 3.58 (L) 02/06/2016   Coagulation:  Lab Results  Component Value Date   INR 1.47 01/23/2016   APTT 20 (L) 05/18/2015   CMP Latest Ref Rng & Units 03/02/2016 02/06/2016 02/05/2016  Glucose 65 - 99 mg/dL 161(W) 960(A) 540(J)  BUN 6 - 20 mg/dL <8(J) <1(B) <1(Y)  Creatinine 0.44 - 1.00 mg/dL 7.82 9.56 2.13  Sodium 135 - 145 mmol/L 137 132(L) 131(L)  Potassium 3.5 - 5.1 mmol/L 3.4(L) 4.0 3.3(L)  Chloride 101 - 111 mmol/L 104 102 99(L)  CO2 22 - 32 mmol/L 27 25 27   Calcium 8.9 - 10.3 mg/dL 8.9 0.8(M) 7.7(L)  Total Protein 6.5 - 8.1 g/dL 7.4 - -  Total  Bilirubin 0.3 - 1.2 mg/dL 1.2 - -  Alkaline Phos 38 - 126 U/L 92 - -  AST 15 - 41 U/L 31 - -  ALT 14 - 54 U/L 17 - -     Imaging studies: No new pertinent imaging studies available   Assessment/Plan:  56 y.o. female with ulcerated umbilical hernia without evidence of obstruction, complicated by extensive and severe co-morbidities including advanced alcoholic cirrhosis with moderately well-controlled hepatic ascites as well as atrial flutter, hyperprolactinemia, multiple sclerosis, polysubstance abuse, malnutrition, bipolar disorder, generalized anxiety disorder, ADHD, chronic back pain, chronic tobacco abuse, medical non-compliance, GERD, osteoarthritis, and osteoporosis.   - surgical repair of ulcerated umbilical hernia is indicated if hepatic ascites can consistently and aggressively be well-controlled both pre- and post-operatively to facilitate wound healing  - considering medical complexity of patient's pre- and post-operative management, patient would likely benefit from hepatology and similar resources more readily available at a tertiary care facility such as [facility] (closer to patient's residence in [place]) or [facility]  - all risks, benefits, and alternatives to surgical repair of umbilical hernia in the context of hepatic cirrhosis were discussed with the patient, and all of her questions were answered to her expressed satisfaction  - patient advised to continue with medical management of hepatic ascites and wear abdominal binder  - refferal offered and patient encouraged to call office if any questions  - ongoing abstinence from alcohol applauded and encouraged  All of the above recommendations were discussed with patient, and all of patient's questions were answered to her expressed satisfaction.  -- Scherrie Gerlach Earlene Plater, MD, RPVI [place]: [facility] - Partnering for exceptional care. Office: [phone]

## 2016-05-26 NOTE — Telephone Encounter (Signed)
Pt returned my call and dates were discussed in regards to scheduling a colonoscopy and EGD. Pt will see about getting transportation arranged and call back to set up.

## 2016-05-26 NOTE — Patient Instructions (Signed)
As we spoke today, we will have you seen at either [facility] or [facility] to make sure that you have all available resources when planning your umbilical hernia repair with the current Liver Disease and Ascites.  Please call with your decision as to which facility you would prefer and we will send the appropriate referrals and make the appointments for you.

## 2016-05-27 ENCOUNTER — Telehealth: Payer: Self-pay

## 2016-05-27 NOTE — Telephone Encounter (Signed)
Patient is calling because she is not happy with her visit and the doctor referring her out to [facility].   Patient hung up on me in mid sentence on the first phone call, she claims she had to pick up the other like. She called again and hung up on me that time because she didn't like what I was telling her.   Please call patient and advice.

## 2016-05-28 NOTE — Telephone Encounter (Signed)
Returned phone call to patient at this time to discuss her dissatisfaction with her visit as well as to find out which [facility] she has chosen for her care going forward in regards to her hernia surgery.   No answer when I called. Left voicemail for return phone call.

## 2016-05-28 NOTE — Telephone Encounter (Signed)
Call made to patient once again at this time. No answer. Left voicemail asking for a return phone call.

## 2016-05-31 ENCOUNTER — Telehealth: Payer: Self-pay | Admitting: General Practice

## 2016-05-31 NOTE — Telephone Encounter (Signed)
Patient called and left a voicemail at 5:44 am stating that we tried to referral her, and said she was discourage from all this. Said that we should of known before she came that we would not do the surgery. She has liver disease and we just saw her for an appointment, said that we gave her patient abuse because we will not do her surgery, or take on the surgery. She said she should not be billed for this appointment.

## 2016-06-02 ENCOUNTER — Telehealth: Payer: Self-pay

## 2016-06-03 ENCOUNTER — Encounter: Payer: Self-pay | Admitting: General Surgery

## 2016-06-03 NOTE — Telephone Encounter (Signed)
Patient called in and states that she felt like the surgeon did her an injustice by asking her to go to a tertiary care center. She feels that being under the care of  Dr. Servando Snare (Gastroenterologist) and him managing her ascites that this surgery can be done effectively at [facility] and that she would have the resources that she needs in her post-op period. She would not like to go to [place], [place], or [facility] as she was offered as she states that she has transportation problems to those facilities. She also does not want a 2nd opinion in our office, she explained "In my experience that if one doctor in a practice denies you surgery then they all will." I have offered patient to go to [facility] for a 2nd opinion in this matter and she does not want to make an appointment there but to have a surgeon review her chart prior and tell her whether she would be a surgical candidate prior to scheduling an appointment so that she does not have to pay her copay "for nothing." The patient is very unhappy with all options given. I have contacted [facility] so that they may call patient for an appointment. Referral will be placed.

## 2016-06-03 NOTE — Telephone Encounter (Signed)
Call made to [facility]- spoke with [facility]. Patient's appointment will be with Dr. Lemar Livings at 06/14/16 at 1515.  Call made to patient. She did not answer. Unable to leave voicemail. If patient returns phone call please give above information.

## 2016-06-03 NOTE — Telephone Encounter (Signed)
I have put in an internal referral to [facility].   Appointment has already been Scheduled. I tried calling the patient to make her aware of her appointment but she did not pick up. I left her a message stating her appointment date and time as well as the contact number to [facility]. [phone].

## 2016-06-08 ENCOUNTER — Encounter: Payer: Self-pay | Admitting: *Deleted

## 2016-06-14 ENCOUNTER — Ambulatory Visit: Payer: Managed Care, Other (non HMO) | Admitting: General Surgery

## 2016-06-17 ENCOUNTER — Other Ambulatory Visit: Payer: Self-pay

## 2016-06-17 ENCOUNTER — Other Ambulatory Visit: Payer: Self-pay | Admitting: Gastroenterology

## 2016-06-17 MED ORDER — SPIRONOLACTONE 100 MG PO TABS: 100.0000 mg | ORAL_TABLET | Freq: Every day | ORAL | 5 refills | Status: DC

## 2016-06-17 MED ORDER — FUROSEMIDE 40 MG PO TABS: 40.0000 mg | ORAL_TABLET | Freq: Every day | ORAL | 5 refills | Status: DC

## 2016-06-17 NOTE — Telephone Encounter (Signed)
*  STAT* If patient is at the pharmacy, call can be transferred to refill team.   1. Which medications need to be refilled? (please list name of each medication and dose if known) spironolactone (ALDACTONE) 100 MG tablet & furosemide (LASIX) 40 MG tablet  2. Which pharmacy/location (including street and city if local pharmacy) is medication to be sent to  3. Do they need a 30 day or 90 day supply?

## 2016-06-17 NOTE — Telephone Encounter (Signed)
Left pt a voicemail letter her know I have sent her refills to her pharmacy.

## 2016-08-08 ENCOUNTER — Emergency Department (HOSPITAL_COMMUNITY)
Admission: EM | Admit: 2016-08-08 | Discharge: 2016-08-09 | Disposition: A | Discharge status: Home / Self Care | Payer: 59 | Attending: Emergency Medicine | Admitting: Emergency Medicine

## 2016-08-08 ENCOUNTER — Encounter (HOSPITAL_COMMUNITY): Payer: Self-pay | Admitting: Emergency Medicine

## 2016-08-08 DIAGNOSIS — F909 Attention-deficit hyperactivity disorder, unspecified type: Secondary | ICD-10-CM | POA: Diagnosis not present

## 2016-08-08 DIAGNOSIS — G35 Multiple sclerosis: Secondary | ICD-10-CM | POA: Insufficient documentation

## 2016-08-08 DIAGNOSIS — Z87891 Personal history of nicotine dependence: Secondary | ICD-10-CM | POA: Insufficient documentation

## 2016-08-08 DIAGNOSIS — F419 Anxiety disorder, unspecified: Secondary | ICD-10-CM | POA: Diagnosis not present

## 2016-08-08 DIAGNOSIS — F329 Major depressive disorder, single episode, unspecified: Secondary | ICD-10-CM | POA: Insufficient documentation

## 2016-08-08 DIAGNOSIS — F32A Depression, unspecified: Secondary | ICD-10-CM

## 2016-08-08 LAB — CBC
HEMATOCRIT: 37.2 % (ref 36.0–46.0)
HEMOGLOBIN: 12.1 g/dL (ref 12.0–15.0)
MCH: 28.7 pg (ref 26.0–34.0)
MCHC: 32.5 g/dL (ref 30.0–36.0)
MCV: 88.2 fL (ref 78.0–100.0)
Platelets: 160 10*3/uL (ref 150–400)
RBC: 4.22 MIL/uL (ref 3.87–5.11)
RDW: 18.6 % — ABNORMAL HIGH (ref 11.5–15.5)
WBC: 5.7 10*3/uL (ref 4.0–10.5)

## 2016-08-08 LAB — COMPREHENSIVE METABOLIC PANEL
ALBUMIN: 3.6 g/dL (ref 3.5–5.0)
ALT: 38 U/L (ref 14–54)
AST: 73 U/L — ABNORMAL HIGH (ref 15–41)
Alkaline Phosphatase: 152 U/L — ABNORMAL HIGH (ref 38–126)
Anion gap: 9 (ref 5–15)
BUN: 5 mg/dL — AB (ref 6–20)
CHLORIDE: 100 mmol/L — AB (ref 101–111)
CO2: 26 mmol/L (ref 22–32)
Calcium: 8.4 mg/dL — ABNORMAL LOW (ref 8.9–10.3)
Creatinine, Ser: 0.41 mg/dL — ABNORMAL LOW (ref 0.44–1.00)
GFR calc Af Amer: >60 mL/min (ref >60)
GFR calc non Af Amer: >60 mL/min (ref >60)
GLUCOSE: 95 mg/dL (ref 65–99)
POTASSIUM: 3.5 mmol/L (ref 3.5–5.1)
Sodium: 135 mmol/L (ref 135–145)
Total Bilirubin: 0.9 mg/dL (ref 0.3–1.2)
Total Protein: 6.7 g/dL (ref 6.5–8.1)

## 2016-08-08 LAB — ETHANOL: Alcohol, Ethyl (B): 249 mg/dL — ABNORMAL HIGH (ref <5)

## 2016-08-08 LAB — URINALYSIS, ROUTINE W REFLEX MICROSCOPIC
Bilirubin Urine: NEGATIVE
Glucose, UA: NEGATIVE mg/dL
HGB URINE DIPSTICK: NEGATIVE
Ketones, ur: NEGATIVE mg/dL
NITRITE: NEGATIVE
PROTEIN: NEGATIVE mg/dL
Specific Gravity, Urine: 1.004 — ABNORMAL LOW (ref 1.005–1.030)
pH: 8 (ref 5.0–8.0)

## 2016-08-08 LAB — RAPID URINE DRUG SCREEN, HOSP PERFORMED
AMPHETAMINES: NOT DETECTED
Barbiturates: NOT DETECTED
Benzodiazepines: NOT DETECTED
Cocaine: NOT DETECTED
OPIATES: NOT DETECTED
TETRAHYDROCANNABINOL: NOT DETECTED

## 2016-08-08 LAB — ACETAMINOPHEN LEVEL

## 2016-08-08 LAB — SALICYLATE LEVEL

## 2016-08-08 LAB — PREGNANCY, URINE: Preg Test, Ur: NEGATIVE

## 2016-08-08 MED ORDER — LORAZEPAM 1 MG PO TABS
0.0000 mg | ORAL_TABLET | Freq: Four times a day (QID) | ORAL | Status: DC
  Administered 2016-08-08 (×4): 2 mg via ORAL
  Administered 2016-08-09: 1 mg via ORAL
  Filled 2016-08-08 (×4): qty 2
  Filled 2016-08-08: qty 1

## 2016-08-08 MED ORDER — RISPERIDONE 0.5 MG PO TABS
0.5000 mg | ORAL_TABLET | Freq: Three times a day (TID) | ORAL | Status: DC | PRN
  Administered 2016-08-08: 0.5 mg via ORAL
  Filled 2016-08-08: qty 1

## 2016-08-08 MED ORDER — LORAZEPAM 2 MG/ML IJ SOLN: 0.0000 mg | Freq: Two times a day (BID) | INTRAMUSCULAR | Status: DC

## 2016-08-08 MED ORDER — ALBUTEROL SULFATE HFA 108 (90 BASE) MCG/ACT IN AERS
2.0000 | INHALATION_SPRAY | RESPIRATORY_TRACT | Status: DC | PRN
Start: 2016-08-08 — End: 2016-08-09
  Administered 2016-08-08: 2 via RESPIRATORY_TRACT
  Filled 2016-08-08: qty 6.7

## 2016-08-08 MED ORDER — LISDEXAMFETAMINE DIMESYLATE 30 MG PO CAPS
70.0000 mg | ORAL_CAPSULE | Freq: Every day | ORAL | Status: DC
  Administered 2016-08-08 – 2016-08-09 (×2): 70 mg via ORAL
  Filled 2016-08-08 (×2): qty 2

## 2016-08-08 MED ORDER — LISDEXAMFETAMINE DIMESYLATE 30 MG PO CAPS: 70.0000 mg | ORAL_CAPSULE | Freq: Every day | ORAL | Status: DC

## 2016-08-08 MED ORDER — LISDEXAMFETAMINE DIMESYLATE 30 MG PO CAPS: 60.0000 mg | ORAL_CAPSULE | Freq: Every day | ORAL | Status: DC

## 2016-08-08 MED ORDER — VITAMIN B-1 100 MG PO TABS
100.0000 mg | ORAL_TABLET | Freq: Every day | ORAL | Status: DC
  Administered 2016-08-08 – 2016-08-09 (×2): 100 mg via ORAL
  Filled 2016-08-08 (×2): qty 1

## 2016-08-08 MED ORDER — HYDROXYZINE HCL 50 MG PO TABS
50.0000 mg | ORAL_TABLET | Freq: Three times a day (TID) | ORAL | Status: DC | PRN
  Administered 2016-08-08: 50 mg via ORAL
  Filled 2016-08-08: qty 1

## 2016-08-08 MED ORDER — GABAPENTIN 100 MG PO CAPS
200.0000 mg | ORAL_CAPSULE | Freq: Three times a day (TID) | ORAL | Status: DC
  Administered 2016-08-08: 100 mg via ORAL
  Administered 2016-08-08 – 2016-08-09 (×3): 200 mg via ORAL
  Filled 2016-08-08 (×4): qty 2

## 2016-08-08 MED ORDER — FUROSEMIDE 20 MG PO TABS
40.0000 mg | ORAL_TABLET | Freq: Every day | ORAL | Status: DC
Start: 2016-08-08 — End: 2016-08-09
  Administered 2016-08-08 – 2016-08-09 (×2): 40 mg via ORAL
  Filled 2016-08-08 (×2): qty 2

## 2016-08-08 MED ORDER — LORAZEPAM 2 MG/ML IJ SOLN: 0.0000 mg | Freq: Four times a day (QID) | INTRAMUSCULAR | Status: DC

## 2016-08-08 MED ORDER — NICOTINE 21 MG/24HR TD PT24
21.0000 mg | MEDICATED_PATCH | Freq: Once | TRANSDERMAL | Status: AC
  Administered 2016-08-08: 21 mg via TRANSDERMAL
  Filled 2016-08-08: qty 1

## 2016-08-08 MED ORDER — BUSPIRONE HCL 10 MG PO TABS
15.0000 mg | ORAL_TABLET | Freq: Two times a day (BID) | ORAL | Status: DC
  Administered 2016-08-08 – 2016-08-09 (×3): 15 mg via ORAL
  Filled 2016-08-08 (×3): qty 2

## 2016-08-08 MED ORDER — AEROCHAMBER PLUS FLO-VU LARGE MISC: 1.0000 | Freq: Once | Status: DC

## 2016-08-08 MED ORDER — LORAZEPAM 1 MG PO TABS: 0.0000 mg | ORAL_TABLET | Freq: Two times a day (BID) | ORAL | Status: DC

## 2016-08-08 MED ORDER — SPIRONOLACTONE 50 MG PO TABS
100.0000 mg | ORAL_TABLET | Freq: Every day | ORAL | Status: DC
  Administered 2016-08-08 – 2016-08-09 (×2): 100 mg via ORAL
  Filled 2016-08-08: qty 1
  Filled 2016-08-08: qty 2

## 2016-08-08 MED ORDER — MOMETASONE FURO-FORMOTEROL FUM 100-5 MCG/ACT IN AERO
2.0000 | INHALATION_SPRAY | Freq: Two times a day (BID) | RESPIRATORY_TRACT | Status: DC
  Administered 2016-08-08 – 2016-08-09 (×3): 2 via RESPIRATORY_TRACT
  Filled 2016-08-08: qty 8.8

## 2016-08-08 MED ORDER — FLUOXETINE HCL 20 MG PO CAPS
20.0000 mg | ORAL_CAPSULE | Freq: Two times a day (BID) | ORAL | Status: DC
  Administered 2016-08-08 – 2016-08-09 (×3): 20 mg via ORAL
  Filled 2016-08-08 (×3): qty 1

## 2016-08-08 MED ORDER — TOPIRAMATE 25 MG PO TABS
100.0000 mg | ORAL_TABLET | Freq: Every day | ORAL | Status: DC
  Administered 2016-08-08 – 2016-08-09 (×2): 100 mg via ORAL
  Filled 2016-08-08 (×2): qty 4

## 2016-08-08 MED ORDER — THIAMINE HCL 100 MG/ML IJ SOLN: 100.0000 mg | Freq: Every day | INTRAMUSCULAR | Status: DC

## 2016-08-08 NOTE — ED Notes (Addendum)
TTS at bedside. Pt given gingerale and a sandwich.

## 2016-08-08 NOTE — ED Notes (Signed)
Pt on phone at nurses' desk calling [employer].

## 2016-08-08 NOTE — ED Notes (Signed)
Pt asking again why she cannot stay here to be treated for detox, this RN explained that she is medically cleared to go to a more appropriate facility that can help her detox. Pt continued to state that she has detoxed here before and does not understand why she has to go somewhere else today.

## 2016-08-08 NOTE — ED Notes (Signed)
Pt requested to speak with RN. Pt states " I don't belong here. I called the [facility] for help and ya'll are not doing anything for me. I am going to leave!" Explained to the pt that she can leave but if she attempts to then we have no choice but to IVC her based on comments she made to the [facility] counselor. Pt stated " I made those comments because I was off of my Risperdal and Prozac." Advised pt that she will get another opportunity in the morning to speak with the [facility] counselor and can express her concerns then. Advised pt that until that point I and other staff will be giving her medications and nutrition. Pt was satisfied at that moment.

## 2016-08-08 NOTE — ED Notes (Addendum)
Advised pt [facility] Counselor aware of her request. Pt asking repeatedly for rx for Vyvanse. Advised pt will not be provided as she has an rx at her dr's office. Voiced understanding.

## 2016-08-08 NOTE — ED Notes (Signed)
Pharm Tech in w/pt. 

## 2016-08-08 NOTE — ED Notes (Signed)
Pt stated that she "wants to leave". Pt stated that she "doesn't belong here and that it was a mistake coming here". Pt wanted to know what to do to leave. Pt informed that she discussed things with the counselor and that this Tech did not know anything they discussed and that this Tech has no control over if the patient stays of goes home. Pt came out to the desk and was tearful. Becky - RN informed and aware.

## 2016-08-08 NOTE — ED Notes (Addendum)
Pt continues to come to the desk and ask about leaving. Pt wanting to speak to the doctors to see if they can do something to allow her to leave. Pt is concerned about her IRA. Pt asked to go back to room to get vitals. Pt complied.

## 2016-08-08 NOTE — ED Notes (Signed)
Dr. Rhunette Croft made aware that patient needs her home medications ordered.

## 2016-08-08 NOTE — ED Notes (Signed)
Pt returned to the desk asking to speak with her Physician from [facility]. Pt informed that information will be documented in her patient chart.

## 2016-08-08 NOTE — ED Notes (Addendum)
While pt was in the bathroom, pt urinated on the floor. Pt stated that she "tried to make it to the bathroom on time, but couldn't". A bedside commode was placed in the pt's room.

## 2016-08-08 NOTE — ED Notes (Signed)
Lunch tray placed in pt's room.  

## 2016-08-08 NOTE — ED Notes (Signed)
Patient stating she thinks she needs to stay in the hospital a few days until she feels better, this RN explained to patient that she has been medically cleared by the physician and is appropriate for placement at a detox facility. Pt continued to insist that she needs to stay in the hospital for further treatment prior to going anywhere else. This RN advised I would let the ED physician know of her concerns and have them come and speak with her.

## 2016-08-08 NOTE — ED Notes (Signed)
Pt's dinner tray arrived. 

## 2016-08-08 NOTE — ED Notes (Signed)
Pt came up to desk asking to leave. Pt stated that she is "not suicidal or that she won't hurt anyone". Pt stated that she "wants to sign a contract and leave". Pt stated that "the contract states that if she is suicidal to call 911". Pt stated that she "will call 911, if she becomes suicidal". Pt informed to speak to [facility] fundraiser.

## 2016-08-08 NOTE — ED Notes (Signed)
Pt asking about leaving again.

## 2016-08-08 NOTE — ED Notes (Signed)
Pt continues to ask for multiple items repeatedly - 1 at a time - pt ambulates to nurses' desk then back to room - asking when she can speak w/[facility] so she may advise them she is not SI - advised pt [facility] aware and usually does not re-assess until 24 hours. Asking for Nicotine Patch - advised has been ordered. Asking for Vyvanse - advised has been ordered. States she did not pick up rx from dr's office yesterday. Asking for rx for Vyvanse. Advised her no - as she had just mentioned she has rx at dr's office - voiced understanding. States Vyvanse is for 70mg  - advised we have it entered as 60mg  - states dr changed it 2 months ago. Pt returned to desk asking if she can just leave d/t arrived voluntarily. Advised no d/t [facility] assessment stating pt stated she was SI - pt continues to deny. Voiced understanding RN has advised [facility] Counselor of this and they may attempt to speak w/her again today d/t was intoxicated at the time. Pt returned to desk - asking for taxi voucher for if and when she is d/c'd today - also asking if she can be d/c'd by 1600. Advised pt no taxi voucher - that she may call her neighbor or friend - states she does not have her cell phone - advised pt staff will assist when time comes. Advised her it is doubtful she will be d/c'd by 1600 today.
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
## 2016-08-08 NOTE — ED Notes (Signed)
Pt at nurses' desk asking same repeated questions - states "Can you not call another dr and tell him I'm not suicidal?" Advised pt [facility] is aware and encouraged pt to lie down and rest. Pt returned to room.

## 2016-08-08 NOTE — ED Notes (Signed)
Staffing Office aware of need for sitter. 

## 2016-08-08 NOTE — ED Notes (Signed)
Pt placed in burgundy scrubs. Wanded by security. Belongings inventoried and placed in locker number 13. Valuables taken to security. Envelope # [id number].

## 2016-08-08 NOTE — ED Notes (Signed)
Breakfast tray at bedside 

## 2016-08-08 NOTE — ED Notes (Signed)
Pt's mother called Kaleen Mask - [phone] hm - [phone] - cell. Advised she has not seen pt since Jan 2018 and wants pt to get help but pt continues to refuse. States she would like to visit pt but does not want to cause problems. Advised her RN will ask pt if she wants her to visit when she awakens. Pt now awake - ambulatory to desk stating "I think I'm going to leave now". Escorted pt to her room - and advised her that [facility] and [facility] May accept her. Pt refuses - states "they can't help me with my medical problem - incontinence". Allowed pt to vent her concerns/feelings. Advised pt [facility] Counselor will perform reassessment tomorrow d/t pt's usually stay at least 24 hours after they have voiced SI. Pt voiced understanding. Advised pt her mother called and will visit if she would like - pt states "Only if I'm being discharged and she can take me home". Pt slept approx 5 min - ambulated to bathroom - then to nurses' desk - making same statements as above re: leaving. Pt returned to room after RN gave pt magazines.

## 2016-08-08 NOTE — ED Notes (Signed)
U/A ordered d/t c/o possible bladder infection.

## 2016-08-08 NOTE — ED Notes (Signed)
Lunch tray ordered 

## 2016-08-08 NOTE — ED Notes (Addendum)
Pt very preoccupied about the cost of the hospital visit and not wanting to be seen by the doctor because the assessment is expensive. Pt asking how she will get home because she doesn't have any money. Pt stating that she wants to go home and will call her internal medicine doctor to be medically cleared by him.

## 2016-08-08 NOTE — BH Assessment (Signed)
Faxed clinical information to the following facilities for placement:  [facility]   [address] Patsy [place], [facility], [facility], [facility] Triage Specialist [phone]

## 2016-08-08 NOTE — ED Notes (Signed)
Pt called out stating she had urinated in the floor again, pt given clean pants and a brief and assisted to clean up. Pt advised to call when she needs to urinate so that staff can place her on a bedpan since she is having issues holding her bladder.

## 2016-08-08 NOTE — ED Notes (Signed)
ED Provider at bedside. 

## 2016-08-08 NOTE — Progress Notes (Signed)
RN Lelon Mast from [place] called to inquire if patient still needed placement. Writer informed that patient needs placement. Writer awaiting call back from [facility] regarding potential placement.  Melbourne Abts, LCSWA Disposition staff 08/08/2016 12:30 PM

## 2016-08-08 NOTE — Progress Notes (Signed)
Patient's referral to inpatient treatment facilities has been followed up at: [facility] - per Greggory Stallion, refax it. Referral was refaxed. [facility] - per Jasmine Pang, pt to be reviewed. [facility] - at capacity [facility] - at capacity [facility] - no beds today per intake. [facility] - Tanisha,  [facility] - per Silva Bandy, refax it  [facility] - patient is under review, per [facility] - at capacity   Patient is being referred to [facility].  CSW in disposition will continue to follow up with placement efforts.  Anne Black, LCSWA Disposition staff 08/08/2016 9:56 AM

## 2016-08-08 NOTE — ED Notes (Signed)
Pt arrived to F8 via stretcher. Pt ambulatory to her room then to bathroom. RN assisted pt w/obtaining urine specimen. Pt noted to be incont into brief and cont of urine. Pt denies SI/HI - denies advising SI to [facility]. States she has been medically admitted prior for ETOH withdrawals d/t "I'm a complicated pt. I have MS. I went to [facility] before and they referred me here for medial clearance". States she came to ED via deputy d/t states phone was not working and she and her neighbor were worried so called them. States she does not want to go to [facility] - she does "not think they're equipped to handle me". Pt has copy of Medical Clearance Pt policy form that she had signed previously. Pt given Diet Coke as requested.

## 2016-08-08 NOTE — ED Provider Notes (Signed)
[facility]-EM[facility]Y DEPT Provider Note   CSN: [id number] Arrival date & time: 08/08/16  0040     History   Chief Complaint Chief Complaint  Patient presents with  . Medical Clearance    HPI Anne Black is a 56 y.o. female with a hx of ADHD, anxiety, bipolar disorder, cirrhosis, hyperkalemia, multiple sclerosis presents to the Emergency Department complaining of gradual, persistent, progressively worsening feelings of anxiety and depression onset over the last several weeks but worsening in the last several days. Patient reports she has increased her alcohol content drinking numerous bottles of water. She cannot quantify, John alcohol she is taking. She denies suicidal or homicidal ideations. She does endorse rapid and racing thoughts. She also endorses hearing her children's voices when they are not present. She denies visual hallucinations. Tonight patient was at a friend's house who called the sheriff who brought her here. She reports she needs help with her depression and is anxious about her life. Nothing seems to make the symptoms better or worse. Patient denies somatic symptoms including chest pain, shortness of breath, abdominal pain, nausea, vomiting, diarrhea, weakness, dizziness and syncope.  Patient is in everyday smoker.  The history is provided by the patient and medical records. No language interpreter was used.    Past Medical History:  Diagnosis Date  . ADHD (attention deficit hyperactivity disorder)   . Anxiety   . Arthritis   . Ascites   . Bipolar disorder (HCC)   . Cirrhosis (HCC)   . Hypokalemia   . Migraine   . Multiple sclerosis (HCC)   . Osteoporosis     Patient Active Problem List   Diagnosis Date Noted  . Umbilical hernia without obstruction and without gangrene 05/12/2016  . Itching 02/24/2016  . Ventral hernia without obstruction or gangrene 02/24/2016  . Palliative care encounter   . Ascites   . Tachypnea   . Typical atrial flutter (HCC)   .  Hypomagnesemia   . Hypoxia   . Dehydration 01/22/2016  . Low back ache 12/30/2015  . Non-intractable vomiting with nausea   . Alcohol use disorder (HCC) 12/15/2015  . Acute alcoholism (HCC)   . Abdominal distension 11/04/2015  . Alcohol abuse   . Dyspnea   . Acute respiratory failure (HCC) 10/15/2015  . Ascites due to alcoholic cirrhosis (HCC) 10/15/2015  . Osteoporosis   . Bipolar I disorder (HCC)   . Multiple sclerosis (HCC)   . Alcoholic cirrhosis of liver without ascites (HCC) 08/22/2015  . Bipolar disorder, current episode mixed, moderate (HCC) 08/01/2015  . Alcohol use disorder, severe, dependence (HCC) 07/31/2015  . Macrocytic anemia- due to alcohol abuse with normal B12 & folate levels 05/31/2015  . Severe protein-calorie malnutrition (HCC) 05/31/2015  . Hypokalemia 05/26/2015  . Encephalopathy, hepatic (HCC) 05/26/2015  . Alcohol withdrawal (HCC) 05/18/2015  . Abdominal pain 05/18/2015  . Anxiety 05/18/2015  . UTI (lower urinary tract infection) 05/18/2015  . Stimulant abuse 12/27/2014  . Nicotine abuse 12/27/2014  . Hyperprolactinemia (HCC) 11/15/2014  . Dyslipidemia   . Tobacco abuse 01/23/2014  . Noncompliance with therapeutic plan 04/04/2013  . Unspecified hereditary and idiopathic peripheral neuropathy 05/01/2012  . GERD (gastroesophageal reflux disease) 05/01/2012  . Osteoarthrosis, unspecified whether generalized or localized, involving lower leg 05/01/2012    Past Surgical History:  Procedure Laterality Date  . CESAREAN SECTION  [id number]  . MYRINGOTOMY WITH TUBE PLACEMENT Bilateral   . TONSILLECTOMY      OB History    No data available  Home Medications    Prior to Admission medications   Medication Sig Start Date End Date Taking? Authorizing Provider  albuterol (PROVENTIL HFA;VENTOLIN HFA) 108 (90 Base) MCG/ACT inhaler Inhale 2 puffs into the lungs every 6 (six) hours as needed for wheezing or shortness of breath.   Yes [provider]  busPIRone (BUSPAR) 15 MG tablet TAKE 1 TABLET(15 MG) BY MOUTH TWICE DAILY 04/19/16  Yes [name], MD  FLUoxetine (PROZAC) 20 MG capsule Take 20 mg by mouth 2 (two) times daily. 03/30/16  Yes [provider]  Fluticasone-Salmeterol (ADVAIR) 100-50 MCG/DOSE AEPB Inhale 2 Inhalers into the lungs 2 (two) times daily. 11/05/14  Yes [provider]  furosemide (LASIX) 40 MG tablet Take 1 tablet (40 mg total) by mouth daily. 06/17/16  Yes Midge Minium, MD  gabapentin (NEURONTIN) 100 MG capsule Take 2 capsules (200 mg total) by mouth 3 (three) times daily. Patient taking differently: Take 200 mg by mouth 2 (two) times daily as needed (for ;pan).  01/07/16  Yes [name], MD  hydrOXYzine (ATARAX/VISTARIL) 50 MG tablet Take 1 tablet (50 mg total) by mouth 3 (three) times daily as needed for itching. 03/11/16  Yes [name], MD  Melatonin 3 MG TABS Take 1 tablet (3 mg total) by mouth at bedtime as needed (sleep). 10/24/15  Yes Garth Bigness, MD  modafinil (PROVIGIL) 200 MG tablet Take 200 mg by mouth daily. 08/04/16  Yes [provider]  pantoprazole (PROTONIX) 40 MG tablet Take 1 tablet (40 mg total) by mouth daily. 12/30/15  Yes Haney, Alyssa A, MD  risperiDONE (RISPERDAL) 0.5 MG tablet Take 1 tablet (0.5 mg total) by mouth 3 (three) times daily as needed (agitation). Patient taking differently: Take 0.5 mg by mouth 2 (two) times daily.  02/06/16  Yes Renne Musca, MD  spironolactone (ALDACTONE) 100 MG tablet Take 1 tablet (100 mg total) by mouth daily. 06/17/16  Yes Midge Minium, MD  topiramate (TOPAMAX) 100 MG tablet Take 1 tablet (100 mg total) by mouth daily. 01/07/16  Yes [name], MD  VYVANSE 60 MG capsule Take 60 mg by mouth daily.  05/12/16  Yes [provider]  amantadine (SYMMETREL) 100 MG capsule Take 1 capsule (100 mg total) by mouth 2 (two) times daily. Patient not taking: Reported on 08/08/2016  08/04/15   Oneta Rack, NP  cholestyramine light (PREVALITE) 4 g packet Take 1 packet (4 g total) by mouth 3 (three) times daily. With meals Patient not taking: Reported on 08/08/2016 03/11/16   [name], MD  ondansetron (ZOFRAN ODT) 4 MG disintegrating tablet Take 1 tablet (4 mg total) by mouth every 8 (eight) hours as needed for nausea or vomiting. 4mg  ODT q4 hours prn nausea/vomit Patient not taking: Reported on 08/08/2016 03/30/16   Midge Minium, MD  rifaximin (XIFAXAN) 550 MG TABS tablet Take 1 tablet (550 mg total) by mouth 2 (two) times daily. Patient not taking: Reported on 08/08/2016 06/05/15   Catarina Hartshorn, MD    Family History Family History  Problem Relation Age of Onset  . Arrhythmia Mother   . Heart disease Father   . Hypertension Father     Social History Social History  Substance Use Topics  . Smoking status: Former Smoker    Packs/day: 0.75    Years: 20.00    Types: Cigarettes    Quit date: 05/12/2016  . Smokeless tobacco: Never Used     Comment: Currently Vaping Daily  .  Alcohol use No     Comment: Heavy ETOH Use in Past- Last Drink 01/23/16     Allergies   Patient has no known allergies.   Review of Systems Review of Systems  Constitutional: Negative for appetite change, diaphoresis, fatigue, fever and unexpected weight change.  HENT: Negative for mouth sores.   Eyes: Negative for visual disturbance.  Respiratory: Negative for cough, chest tightness, shortness of breath and wheezing.   Cardiovascular: Negative for chest pain.  Gastrointestinal: Negative for abdominal pain, constipation, diarrhea, nausea and vomiting.  Endocrine: Negative for polydipsia, polyphagia and polyuria.  Genitourinary: Negative for dysuria, frequency, hematuria and urgency.  Musculoskeletal: Negative for back pain and neck stiffness.  Skin: Negative for rash.  Allergic/Immunologic: Negative for immunocompromised state.  Neurological: Negative for syncope, light-headedness  and headaches.  Hematological: Does not bruise/bleed easily.  Psychiatric/Behavioral: Positive for decreased concentration and dysphoric mood. Negative for sleep disturbance and suicidal ideas. The patient is nervous/anxious.   All other systems reviewed and are negative.    Physical Exam Updated Vital Signs BP (!) 120/50   Pulse 81   Temp 97.7 F (36.5 C) (Oral)   Resp 18   Ht 5\' 2"  (1.575 m)   Wt 63.5 kg (140 lb)   SpO2 91%   BMI 25.61 kg/m   Physical Exam  Constitutional: She appears well-developed and well-nourished. No distress.  Awake, alert, nontoxic appearance  HENT:  Head: Normocephalic and atraumatic.  Mouth/Throat: Oropharynx is clear and moist. No oropharyngeal exudate.  Eyes: Conjunctivae are normal. No scleral icterus.  Neck: Normal range of motion. Neck supple.  Cardiovascular: Normal rate, regular rhythm and intact distal pulses.   Pulmonary/Chest: Effort normal. No respiratory distress. She has wheezes ( Mild, expiratory).  Equal chest expansion  Abdominal: Soft. Bowel sounds are normal. She exhibits no mass. There is no tenderness. There is no rebound and no guarding.  Umbilical hernia which is soft, nontender and reducible  Musculoskeletal: Normal range of motion. She exhibits no edema.  Neurological: She is alert.  Speech is clear and goal oriented Moves extremities without ataxia  Skin: Skin is warm and dry. She is not diaphoretic.  Psychiatric: She is actively hallucinating. She exhibits a depressed mood. She expresses no homicidal and no suicidal ideation. She expresses no suicidal plans and no homicidal plans.  Patient is tearful throughout her discussion.  Nursing note and vitals reviewed.    ED Treatments / Results  Labs (all labs ordered are listed, but only abnormal results are displayed) Labs Reviewed  COMPREHENSIVE METABOLIC PANEL - Abnormal; Notable for the following:       Result Value   Chloride 100 (*)    BUN 5 (*)    Creatinine,  Ser 0.41 (*)    Calcium 8.4 (*)    AST 73 (*)    Alkaline Phosphatase 152 (*)    All other components within normal limits  ETHANOL - Abnormal; Notable for the following:    Alcohol, Ethyl (B) 249 (*)    All other components within normal limits  CBC - Abnormal; Notable for the following:    RDW 18.6 (*)    All other components within normal limits  ACETAMINOPHEN LEVEL - Abnormal; Notable for the following:    Acetaminophen (Tylenol), Serum <10 (*)    All other components within normal limits  SALICYLATE LEVEL  RAPID URINE DRUG SCREEN, HOSP PERFORMED  PREGNANCY, URINE    EKG  EKG Interpretation None       Radiology  No results found.  Procedures Procedures (including critical care time)  Medications Ordered in ED Medications  LORazepam (ATIVAN) injection 0-4 mg ( Intravenous See Alternative 08/08/16 0240)    Or  LORazepam (ATIVAN) tablet 0-4 mg (2 mg Oral Given 08/08/16 0240)  LORazepam (ATIVAN) injection 0-4 mg (not administered)    Or  LORazepam (ATIVAN) tablet 0-4 mg (not administered)  thiamine (VITAMIN B-1) tablet 100 mg (not administered)    Or  thiamine (B-1) injection 100 mg (not administered)  albuterol (PROVENTIL HFA;VENTOLIN HFA) 108 (90 Base) MCG/ACT inhaler 2 puff (not administered)  AEROCHAMBER PLUS FLO-VU LARGE MISC 1 each (not administered)     Initial Impression / Assessment and Plan / ED Course  I have reviewed the triage vital signs and the nursing notes.  Pertinent labs & imaging results that were available during my care of the patient were reviewed by me and considered in my medical decision making (see chart for details).  Clinical Course as of Aug 09 338  Sun Aug 08, 2016  0340 Discussed with [facility].  Pt meets inpatient criteria.    [HM]    Clinical Course User Index [HM] Leonela Kivi, Dahlia Client, [place]    Patient presents with increasing depression and worsening anxiety. She denies suicidal ideation but does endorse some auditory  hallucinations and racing thoughts. Patient is well-appearing, but intoxicated. She is medically cleared.  Will consult TTS.    Final Clinical Impressions(s) / ED Diagnoses   Final diagnoses:  Depression, unspecified depression type  Anxiety    New Prescriptions New Prescriptions   No medications on file     Milta Deiters 08/08/16 0341    Dione Booze, MD 08/08/16 [phone]

## 2016-08-08 NOTE — ED Notes (Signed)
[facility] to possibly accept pt - advised [place], AC, of pt's urine incont episodes - pt to be reassessed in am.

## 2016-08-08 NOTE — ED Triage Notes (Signed)
Reports I don't know why I am here.  I tried to go to [facility] but they won't take me without medical clearance.  Denies SI or HI.  Does admit to drinking alcohol for the last couple of weeks.

## 2016-08-08 NOTE — ED Notes (Addendum)
Pt on phone at nurses' desk attempting to contact [facility] to ask her dr to medically admit her directly. Dr Rhunette Croft aware. Also aware pt asking for her Vyvanse. States she is in ED d/t she ran out yesterday.

## 2016-08-08 NOTE — ED Notes (Signed)
A beside commode was placed in pt's room. While attempting to urinate using the bedside commode, pt missed and urinated on the floor. Pt told to use the brief that she is currently wearing, instead of using the bedside commode.

## 2016-08-08 NOTE — ED Notes (Addendum)
Pt consistently calling out on call bell. Pt stating that her nausea is going to get worse and that she will need something else for nausea. Pt stated that she thinks she has a bladder infection. Pt attempted to use bathroom and pt said that she won't be able to use the bathroom unless she gets her spironolactone from home. This RN stated that she would let the EDP know about her concerns.

## 2016-08-08 NOTE — ED Notes (Signed)
Dinner tray ordered.

## 2016-08-08 NOTE — ED Notes (Addendum)
While obtaining pt's vitals, pt asked if this Tech "knew someone who works here that lives in [place]." Pt was asked why she asked and the pt stated that she "lives in that area and needs a ride home." Pt informed that staff is unable to give pt's rides home.

## 2016-08-08 NOTE — ED Notes (Signed)
Pt asleep at this time

## 2016-08-08 NOTE — ED Notes (Signed)
Upon this RN entering the room to check on patient, pt had urinated in the floor and had feces on her feet, this RN assisted patient to change into dry scrubs and changed linen on bed. Pt provided rags and soap to clean up with. Pt stated she had to urinate but did not have time to get to the restroom. Pt provided with underwear and incontinence pad

## 2016-08-09 MED ORDER — ACETAMINOPHEN 325 MG PO TABS
650.0000 mg | ORAL_TABLET | Freq: Once | ORAL | Status: AC
  Administered 2016-08-09: 650 mg via ORAL
  Filled 2016-08-09: qty 2

## 2016-08-09 NOTE — ED Notes (Signed)
Pt. Given belongings and bus pass. Pt encouraged to follow up with PCP for any additional questions . NAD. Ambulatory at DC.

## 2016-08-09 NOTE — Discharge Instructions (Signed)
Follow up with the behavioral health doctors as instructed

## 2016-08-09 NOTE — ED Notes (Signed)
Pt ambulated to RR with steady gait. Meal tray given.

## 2016-08-09 NOTE — ED Provider Notes (Addendum)
Pt was assessed by TTS.  They recommend discharge.  I spoke with patient and answered her questions.        Linwood Dibbles, MD 08/09/16 [phone]

## 2016-08-09 NOTE — ED Notes (Signed)
Pt speaking with daughter on phone. 

## 2016-08-09 NOTE — Consult Note (Signed)
Telepsych Consultation   Reason for Consult: Consult for Anne Black, a 56 y.o female admitted for substance abuse and passive suicide ideations. Referring Physician: EDP [name] MRN:  [id number] Principal Diagnosis: <principal problem not specified> Diagnosis:   Patient Active Problem List   Diagnosis Date Noted  . Umbilical hernia without obstruction and without gangrene [K42.9] 05/12/2016  . Itching [L29.9] 02/24/2016  . Ventral hernia without obstruction or gangrene [K43.9] 02/24/2016  . Palliative care encounter [Z51.5]   . Ascites [R18.8]   . Tachypnea [R06.82]   . Typical atrial flutter (HCC) [I48.3]   . Hypomagnesemia [E83.42]   . Hypoxia [R09.02]   . Dehydration [E86.0] 01/22/2016  . Low back ache [M54.5] 12/30/2015  . Non-intractable vomiting with nausea [R11.2]   . Alcohol use disorder ([place]) [F10.99] 12/15/2015  . Acute alcoholism ([place]) [F10.20]   . Abdominal distension [R14.0] 11/04/2015  . Alcohol abuse [F10.10]   . Dyspnea [R06.00]   . Acute respiratory failure ([place]) [J96.00] 10/15/2015  . Ascites due to alcoholic cirrhosis ([place]) [Z60.10] 10/15/2015  . Osteoporosis [M81.0]   . Bipolar I disorder ([place]) [F31.9]   . Multiple sclerosis ([place]) [G35]   . Alcoholic cirrhosis of liver without ascites ([place]) [K70.30] 08/22/2015  . Bipolar disorder, current episode mixed, moderate ([place]) [F31.62] 08/01/2015  . Alcohol use disorder, severe, dependence ([place]) [F10.20] 07/31/2015  . Macrocytic anemia- due to alcohol abuse with normal B12 & folate levels [D53.9] 05/31/2015  . Severe protein-calorie malnutrition ([place]) [E43] 05/31/2015  . Hypokalemia [E87.6] 05/26/2015  . Encephalopathy, hepatic ([place]) [K72.90] 05/26/2015  . Alcohol withdrawal ([place]) [F10.239] 05/18/2015  . Abdominal pain [R10.9] 05/18/2015  . Anxiety [F41.9] 05/18/2015  . UTI (lower urinary tract infection) [N39.0] 05/18/2015  . Stimulant abuse [F15.10] 12/27/2014  . Nicotine abuse  [Z72.0] 12/27/2014  . Hyperprolactinemia ([place]) [E22.1] 11/15/2014  . Dyslipidemia [E78.5]   . Tobacco abuse [Z72.0] 01/23/2014  . Noncompliance with therapeutic plan [Z91.11] 04/04/2013  . Unspecified hereditary and idiopathic peripheral neuropathy [G60.9] 05/01/2012  . GERD (gastroesophageal reflux disease) [K21.9] 05/01/2012  . Osteoarthrosis, unspecified whether generalized or localized, involving lower leg [M17.10] 05/01/2012    Total Time spent with patient: 30 minutes  Subjective:   Anne Black is a 56 y.o. female patient admitted with anxiety, depression and heavy alcohol use.  HPI: Per the assessment completed by Rico Sheehan on 08/08/16: Anne Black is an 56 y.o. divorced female who presents to [facility] reporting symptoms of depression, anxiety and heavy alcohol use.  Tonight patient was at a friend's house who called the sheriff who brought her to the ED.  It has a history of bipolar disorder and alcohol use disorder. She says she started drinking alcohol again on a daily basis several weeks ago. Pt's blood alcohol level is 249. She states she drinks wine but cannot estimate the amount. She reports she experiences withdrawal symptoms when she stops drinking including tremors, sweats, nausea and irritability. Pt reports gradual, persistent, progressively worsening feelings of anxiety and depressiononset over the last several weeks but worsening in the last several days. Pt reports symptoms including crying spells, social withdrawal, loss of interest in usual pleasures, fatigue, irritability, decreased concentration, decreased sleep and feelings of guilt and hopelessness. Pt reports racing thoughts. She reports suicidal ideation with no plan or intent. Pt states "I feel I would be better off dead." She denies history of previous suicide attempts. Pt denies current homicidal ideation or history of violence. She denies history  of psychotic symptoms. Pt denies any substance use other  than alcohol and tobacco.  Pt identifies several stressors. 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 Pt also recently has been charged with DUI and has a pending court date in August. Pt reports she has diagnosis of multiple sclerosis, arthritis and other medical problems. She has she has no one in her life who is supportive. Pt says she started drinking years ago after her ex-husband obtained full custody of their daughters.  Pt denies any current outpatient mental health providers. She says her primary care physician prescribes her medications but she doesn't want to disclose the name of her provider. Pt reports she was inpatient at [facility] in June 2017 followed by residential treatment in [place].   Pt is dressed in hospital scrubs, alert, oriented x4 with normal speech and normal motor behavior. Eye contact is good. Pt's mood is depressed and anxious; affect is congruent with mood. Thought process is coherent and relevant. There is no indication Pt is currently responding to internal stimuli or experiencing delusional thought content. Pt was cooperative throughout assessment. She states she is willing to sign voluntarily into a psychiatric facility.  On Exam: Patient was seen, chart reviewed with treatment team. Patien was in bed awake, alert and oriented x4. Patient reiterated the reason for this hospital admission as documented above. She stated that she just had too much alcohol to drink. Patient denies and suicidal/homicidal ideation at this time, also denies [facility], rated her depression as 5/10 and reporting being stable with her medications. Patient stated that she ran out of her Vyvanse a few days ago leading to her drinking too much. Patient currently minimizing her behavior and stated that she wants to go home as she has a lot  of responsibilities at home including her gardening and other stuff. Patient stated that she takes her medications daily as prescribed.  Past Psychiatric History: See H&P  Risk to Self: Suicidal Ideation: Yes-Currently Present Suicidal Intent: No Is patient at risk for suicide?: Yes Suicidal Plan?: No Access to Means: No What has been your use of drugs/alcohol within the last 12 months?: Pt drinking large quantities of wine daily How many times?: 0 Other Self Harm Risks: None identified Triggers for Past Attempts: None known Intentional Self Injurious Behavior: None Risk to Others: Homicidal Ideation: No Thoughts of Harm to Others: No Current Homicidal Intent: No Current Homicidal Plan: No Access to Homicidal Means: No Identified Victim: None History of harm to others?: No Assessment of Violence: None Noted Violent Behavior Description: None Does patient have access to weapons?: No Criminal Charges Pending?: Yes Describe Pending Criminal Charges: DUI Does patient have a court date: Yes Court Date:  (Unknown) Prior Inpatient Therapy: Prior Inpatient Therapy: Yes Prior Therapy Dates: 07/2015; 08/2015 Prior Therapy Facilty/Provider(s): [facility]; [facility] Reason for Treatment: Bipolar disorder, alcohol use Prior Outpatient Therapy: Prior Outpatient Therapy: Yes Prior Therapy Dates: 2017 Prior Therapy Facilty/Provider(s): [facility] Reason for Treatment: Alcohol use Does patient have an ACCT team?: No Does patient have Intensive In-House Services?  : No Does patient have [name] services? : No Does patient have P4CC services?: No  Past Medical History:  Past Medical History:  Diagnosis Date  . ADHD (attention deficit hyperactivity disorder)   . Anxiety   . Arthritis   . Ascites   .  Bipolar disorder ([place])   . Cirrhosis ([place])   . Hypokalemia   . Migraine   . Multiple sclerosis ([place])   . Osteoporosis     Past Surgical History:  Procedure Laterality Date   . CESAREAN SECTION  [id number]  . MYRINGOTOMY WITH TUBE PLACEMENT Bilateral   . TONSILLECTOMY     Family History:  Family History  Problem Relation Age of Onset  . Arrhythmia Mother   . Heart disease Father   . Hypertension Father    Family Psychiatric  History: Unknown Social History:  History  Alcohol Use No    Comment: Heavy ETOH Use in Past- Last Drink 01/23/16     History  Drug Use No    Social History   Social History  . Marital status: Divorced    Spouse name: n/a  . Number of children: 5  . Years of education: College   Occupational History  . disabled     on social security disability for MS   Social History Main Topics  . Smoking status: Former Smoker    Packs/day: 0.75    Years: 20.00    Types: Cigarettes    Quit date: 05/12/2016  . Smokeless tobacco: Never Used     Comment: Currently Vaping Daily  . Alcohol use No     Comment: Heavy ETOH Use in Past- Last Drink 01/23/16  . Drug use: No  . Sexual activity: Not Currently   Other Topics Concern  . None   Social History Narrative   Former Teaching laboratory technician.   Started graduate school in counseling, but didn't finish when a good job came open.   Lives alone.   One daughter lives in [place].   Another daughter is in [place].   The other three live in [place], along with her mother.   Father lives in the [place], [place] area.   Additional Social History:    Allergies:  No Known Allergies  Labs:  Results for orders placed or performed during the hospital encounter of 08/08/16 (from the past 48 hour(s))  Comprehensive metabolic panel     Status: Abnormal   Collection Time: 08/08/16 12:53 AM  Result Value Ref Range   Sodium 135 135 - 145 mmol/L   Potassium 3.5 3.5 - 5.1 mmol/L   Chloride 100 (L) 101 - 111 mmol/L   CO2 26 22 - 32 mmol/L   Glucose, Bld 95 65 - 99 mg/dL   BUN 5 (L) 6 - 20 mg/dL   Creatinine, Ser 0.41 (L) 0.44 - 1.00 mg/dL   Calcium 8.4 (L) 8.9 - 10.3 mg/dL   Total  Protein 6.7 6.5 - 8.1 g/dL   Albumin 3.6 3.5 - 5.0 g/dL   AST 73 (H) 15 - 41 U/L   ALT 38 14 - 54 U/L   Alkaline Phosphatase 152 (H) 38 - 126 U/L   Total Bilirubin 0.9 0.3 - 1.2 mg/dL   GFR calc non Af Amer >60 >60 mL/min   GFR calc Af Amer >60 >60 mL/min    Comment: (NOTE) The eGFR has been calculated using the CKD EPI equation. This calculation has not been validated in all clinical situations. eGFR's persistently <60 mL/min signify possible Chronic Kidney Disease.    Anion gap 9 5 - 15  cbc     Status: Abnormal   Collection Time: 08/08/16 12:53 AM  Result Value Ref Range   WBC 5.7 4.0 - 10.5 K/uL   RBC 4.22 3.87 - 5.11 MIL/uL   Hemoglobin  12.1 12.0 - 15.0 g/dL   HCT 37.2 36.0 - 46.0 %   MCV 88.2 78.0 - 100.0 fL   MCH 28.7 26.0 - 34.0 pg   MCHC 32.5 30.0 - 36.0 g/dL   RDW 18.6 (H) 11.5 - 15.5 %   Platelets 160 150 - 400 K/uL  Ethanol     Status: Abnormal   Collection Time: 08/08/16 12:54 AM  Result Value Ref Range   Alcohol, Ethyl (B) 249 (H) <5 mg/dL    Comment:        LOWEST DETECTABLE LIMIT FOR SERUM ALCOHOL IS 5 mg/dL FOR MEDICAL PURPOSES ONLY   Acetaminophen level     Status: Abnormal   Collection Time: 08/08/16  1:55 AM  Result Value Ref Range   Acetaminophen (Tylenol), Serum <10 (L) 10 - 30 ug/mL    Comment:        THERAPEUTIC CONCENTRATIONS VARY SIGNIFICANTLY. A RANGE OF 10-30 ug/mL MAY BE AN EFFECTIVE CONCENTRATION FOR MANY PATIENTS. HOWEVER, SOME ARE BEST TREATED AT CONCENTRATIONS OUTSIDE THIS RANGE. ACETAMINOPHEN CONCENTRATIONS >150 ug/mL AT 4 HOURS AFTER INGESTION AND >50 ug/mL AT 12 HOURS AFTER INGESTION ARE OFTEN ASSOCIATED WITH TOXIC REACTIONS.   Salicylate level     Status: None   Collection Time: 08/08/16  1:55 AM  Result Value Ref Range   Salicylate Lvl <2.4 2.8 - 30.0 mg/dL  Rapid urine drug screen (hospital performed)     Status: None   Collection Time: 08/08/16 12:45 PM  Result Value Ref Range   Opiates NONE DETECTED NONE DETECTED    Cocaine NONE DETECTED NONE DETECTED   Benzodiazepines NONE DETECTED NONE DETECTED   Amphetamines NONE DETECTED NONE DETECTED   Tetrahydrocannabinol NONE DETECTED NONE DETECTED   Barbiturates NONE DETECTED NONE DETECTED    Comment:        DRUG SCREEN FOR MEDICAL PURPOSES ONLY.  IF CONFIRMATION IS NEEDED FOR ANY PURPOSE, NOTIFY LAB WITHIN 5 DAYS.        LOWEST DETECTABLE LIMITS FOR URINE DRUG SCREEN Drug Class       Cutoff (ng/mL) Amphetamine      1000 Barbiturate      200 Benzodiazepine   580 Tricyclics       998 Opiates          300 Cocaine          300 THC              50   Pregnancy, urine     Status: None   Collection Time: 08/08/16 12:45 PM  Result Value Ref Range   Preg Test, Ur NEGATIVE NEGATIVE    Comment:        THE SENSITIVITY OF THIS METHODOLOGY IS >20 mIU/mL.   Urinalysis, Routine w reflex microscopic     Status: Abnormal   Collection Time: 08/08/16 12:45 PM  Result Value Ref Range   Color, Urine COLORLESS (A) YELLOW   APPearance CLEAR CLEAR   Specific Gravity, Urine 1.004 (L) 1.005 - 1.030   pH 8.0 5.0 - 8.0   Glucose, UA NEGATIVE NEGATIVE mg/dL   Hgb urine dipstick NEGATIVE NEGATIVE   Bilirubin Urine NEGATIVE NEGATIVE   Ketones, ur NEGATIVE NEGATIVE mg/dL   Protein, ur NEGATIVE NEGATIVE mg/dL   Nitrite NEGATIVE NEGATIVE   Leukocytes, UA TRACE (A) NEGATIVE   RBC / HPF 0-5 0 - 5 RBC/hpf   WBC, UA 6-30 0 - 5 WBC/hpf   Bacteria, UA RARE (A) NONE SEEN   Squamous Epithelial / LPF  0-5 (A) NONE SEEN   Mucous PRESENT     Current Facility-Administered Medications  Medication Dose Route Frequency Provider Last Rate Last Dose  . AEROCHAMBER PLUS FLO-VU LARGE MISC 1 each  1 each Other Once Muthersbaugh, Hannah, PA-C      . albuterol (PROVENTIL HFA;VENTOLIN HFA) 108 (90 Base) MCG/ACT inhaler 2 puff  2 puff Inhalation Q4H PRN Muthersbaugh, Hannah, PA-C   2 puff at 08/08/16 0931  . busPIRone (BUSPAR) tablet 15 mg  15 mg Oral BID Varney Biles, MD   15 mg at  08/09/16 0931  . FLUoxetine (PROZAC) capsule 20 mg  20 mg Oral BID Kathrynn Humble, Ankit, MD   20 mg at 08/09/16 0935  . furosemide (LASIX) tablet 40 mg  40 mg Oral Daily Varney Biles, MD   40 mg at 08/09/16 0934  . gabapentin (NEURONTIN) capsule 200 mg  200 mg Oral TID Varney Biles, MD   200 mg at 08/09/16 0937  . hydrOXYzine (ATARAX/VISTARIL) tablet 50 mg  50 mg Oral TID PRN Varney Biles, MD   50 mg at 08/08/16 2015  . lisdexamfetamine (VYVANSE) capsule 70 mg  70 mg Oral Daily Varney Biles, MD   70 mg at 08/09/16 0933  . LORazepam (ATIVAN) injection 0-4 mg  0-4 mg Intravenous Q6H Muthersbaugh, Hannah, PA-C       Or  . LORazepam (ATIVAN) tablet 0-4 mg  0-4 mg Oral Q6H Muthersbaugh, Hannah, PA-C   1 mg at 08/09/16 0325  . [START ON 08/10/2016] LORazepam (ATIVAN) injection 0-4 mg  0-4 mg Intravenous Q12H Muthersbaugh, Hannah, PA-C       Or  . Derrill Memo ON 08/10/2016] LORazepam (ATIVAN) tablet 0-4 mg  0-4 mg Oral Q12H Muthersbaugh, Hannah, PA-C      . mometasone-formoterol (DULERA) 100-5 MCG/ACT inhaler 2 puff  2 puff Inhalation BID Varney Biles, MD   2 puff at 08/09/16 0844  . nicotine (NICODERM CQ - dosed in mg/24 hours) patch 21 mg  21 mg Transdermal Once Varney Biles, MD   21 mg at 08/08/16 1439  . risperiDONE (RISPERDAL) tablet 0.5 mg  0.5 mg Oral TID PRN Varney Biles, MD   0.5 mg at 08/08/16 2015  . spironolactone (ALDACTONE) tablet 100 mg  100 mg Oral Daily Kathrynn Humble, Ankit, MD   100 mg at 08/09/16 0935  . thiamine (VITAMIN B-1) tablet 100 mg  100 mg Oral Daily Muthersbaugh, Hannah, PA-C   100 mg at 08/09/16 0933  . topiramate (TOPAMAX) tablet 100 mg  100 mg Oral Daily Varney Biles, MD   100 mg at 08/09/16 3149   Current Outpatient Prescriptions  Medication Sig Dispense Refill  . albuterol (PROVENTIL HFA;VENTOLIN HFA) 108 (90 Base) MCG/ACT inhaler Inhale 2 puffs into the lungs every 6 (six) hours as needed for wheezing or shortness of breath.    . busPIRone (BUSPAR) 15 MG tablet  TAKE 1 TABLET(15 MG) BY MOUTH TWICE DAILY 60 tablet 0  . FLUoxetine (PROZAC) 20 MG capsule Take 20 mg by mouth 2 (two) times daily.    . Fluticasone-Salmeterol (ADVAIR) 100-50 MCG/DOSE AEPB Inhale 2 Inhalers into the lungs 2 (two) times daily.    . furosemide (LASIX) 40 MG tablet Take 1 tablet (40 mg total) by mouth daily. 30 tablet 5  . gabapentin (NEURONTIN) 100 MG capsule Take 2 capsules (200 mg total) by mouth 3 (three) times daily. (Patient taking differently: Take 200 mg by mouth 2 (two) times daily as needed (for ;pan). ) 180 capsule 0  . hydrOXYzine (  ATARAX/VISTARIL) 50 MG tablet Take 1 tablet (50 mg total) by mouth 3 (three) times daily as needed for itching. 90 tablet 0  . lisdexamfetamine (VYVANSE) 70 MG capsule Take 70 mg by mouth daily.    . Melatonin 3 MG TABS Take 1 tablet (3 mg total) by mouth at bedtime as needed (sleep). 30 tablet 0  . modafinil (PROVIGIL) 200 MG tablet Take 200 mg by mouth daily.  1  . pantoprazole (PROTONIX) 40 MG tablet Take 1 tablet (40 mg total) by mouth daily. 30 tablet 3  . risperiDONE (RISPERDAL) 0.5 MG tablet Take 1 tablet (0.5 mg total) by mouth 3 (three) times daily as needed (agitation). (Patient taking differently: Take 0.5 mg by mouth 2 (two) times daily. ) 30 tablet 0  . spironolactone (ALDACTONE) 100 MG tablet Take 1 tablet (100 mg total) by mouth daily. 30 tablet 5  . topiramate (TOPAMAX) 100 MG tablet Take 1 tablet (100 mg total) by mouth daily. 30 tablet 0  . amantadine (SYMMETREL) 100 MG capsule Take 1 capsule (100 mg total) by mouth 2 (two) times daily. (Patient not taking: Reported on 08/08/2016) 30 capsule 0  . cholestyramine light (PREVALITE) 4 g packet Take 1 packet (4 g total) by mouth 3 (three) times daily. With meals (Patient not taking: Reported on 08/08/2016) 60 packet 3  . ondansetron (ZOFRAN ODT) 4 MG disintegrating tablet Take 1 tablet (4 mg total) by mouth every 8 (eight) hours as needed for nausea or vomiting. 51m ODT q4 hours prn  nausea/vomit (Patient not taking: Reported on 08/08/2016) 20 tablet 1  . rifaximin (XIFAXAN) 550 MG TABS tablet Take 1 tablet (550 mg total) by mouth 2 (two) times daily. (Patient not taking: Reported on 08/08/2016) 60 tablet 0    Musculoskeletal: UTA via camera.  Psychiatric Specialty Exam: Physical Exam  Nursing note and vitals reviewed.   Review of Systems  Psychiatric/Behavioral: Positive for depression and substance abuse. Negative for hallucinations, memory loss and suicidal ideas. The patient is nervous/anxious. The patient does not have insomnia.   All other systems reviewed and are negative.   Blood pressure (!) 115/55, pulse 96, temperature 98.3 F (36.8 C), temperature source Oral, resp. rate 16, height '5\' 2"'  (1.575 m), weight 63.5 kg (140 lb), SpO2 97 %.Body mass index is 25.61 kg/m.  General Appearance: unremarkable, dressed in hospital gown  Eye Contact:  Good  Speech:  Clear and Coherent and Normal Rate  Volume:  Normal  Mood:  Anxious and Depressed  Affect:  Labile and Restricted  Thought Process:  Coherent and Goal Directed  Orientation:  Full (Time, Place, and Person)  Thought Content:  WDL and Logical  Suicidal Thoughts:  No  Homicidal Thoughts:  No  Memory:  Immediate;   Good Recent;   Fair Remote;   Fair  Judgement:  Intact  Insight:  Present  Psychomotor Activity:  Normal  Concentration:  Concentration: Good and Attention Span: Good  Recall:  Good  Fund of Knowledge:  Good  Language:  Good  Akathisia:  No  Handed:  Right  AIMS (if indicated):     Assets:  Communication Skills Desire for Improvement Financial Resources/Insurance Housing Leisure Time Resilience  ADL's:  Intact  Cognition:  WNL  Sleep:        Treatment Plan Summary: Patient has denies SI/HI/VAH, does not appear to be a threat to anyone at this time. Insisting that she wants to be discharged as she has a lot of things to take  care of at home. She agrees to follow up OP for alchol  abuse treatment.    Disposition: No evidence of imminent risk to self or others at present.   Patient does not meet criteria for psychiatric inpatient admission. Supportive therapy provided about ongoing stressors. Refer to IOP. Discussed crisis plan, support from social network, calling 911, coming to the Emergency Department, and calling Suicide Hotline.  Discharge patient.  Vicenta Aly, NP 08/09/2016 10:08 AM

## 2016-08-09 NOTE — ED Notes (Signed)
Pt c/o her back hurting.  Pt given hot pack.

## 2016-08-09 NOTE — Progress Notes (Signed)
Per [name].O, NP, patient does not meet criteria for inpatient treatment. Patient is recommended for discharge.   Baldo Daub MSW, LCSWA CSW Disposition [phone]

## 2016-08-12 ENCOUNTER — Telehealth: Payer: Self-pay | Admitting: Obstetrics and Gynecology

## 2016-08-12 NOTE — Telephone Encounter (Signed)
No longer a patient of [facility]. Patient goes to [facility] and new PCP is [facility]. This was verified with patient.

## 2016-08-24 ENCOUNTER — Encounter: Payer: Self-pay | Admitting: *Deleted

## 2016-09-09 ENCOUNTER — Inpatient Hospital Stay (HOSPITAL_COMMUNITY): Payer: 59

## 2016-09-09 ENCOUNTER — Encounter (HOSPITAL_COMMUNITY): Payer: Self-pay | Admitting: Family Medicine

## 2016-09-09 ENCOUNTER — Encounter (HOSPITAL_COMMUNITY)
Admission: EM | Disposition: A | Discharge status: Home / Self Care | Payer: Self-pay | Source: Home / Self Care | Attending: Internal Medicine

## 2016-09-09 ENCOUNTER — Inpatient Hospital Stay (HOSPITAL_COMMUNITY)
Admission: EM | Admit: 2016-09-09 | Discharge: 2016-09-11 | DRG: 511 | Disposition: A | Discharge status: Home / Self Care | Payer: 59 | Attending: Internal Medicine | Admitting: Internal Medicine

## 2016-09-09 ENCOUNTER — Inpatient Hospital Stay (HOSPITAL_COMMUNITY): Payer: 59 | Admitting: Certified Registered Nurse Anesthetist

## 2016-09-09 DIAGNOSIS — F909 Attention-deficit hyperactivity disorder, unspecified type: Secondary | ICD-10-CM | POA: Diagnosis present

## 2016-09-09 DIAGNOSIS — S52532A Colles' fracture of left radius, initial encounter for closed fracture: Secondary | ICD-10-CM | POA: Diagnosis present

## 2016-09-09 DIAGNOSIS — F17290 Nicotine dependence, other tobacco product, uncomplicated: Secondary | ICD-10-CM | POA: Diagnosis present

## 2016-09-09 DIAGNOSIS — W010XXA Fall on same level from slipping, tripping and stumbling without subsequent striking against object, initial encounter: Secondary | ICD-10-CM | POA: Diagnosis present

## 2016-09-09 DIAGNOSIS — E871 Hypo-osmolality and hyponatremia: Secondary | ICD-10-CM | POA: Diagnosis present

## 2016-09-09 DIAGNOSIS — K219 Gastro-esophageal reflux disease without esophagitis: Secondary | ICD-10-CM | POA: Diagnosis present

## 2016-09-09 DIAGNOSIS — R0902 Hypoxemia: Secondary | ICD-10-CM | POA: Diagnosis present

## 2016-09-09 DIAGNOSIS — F1020 Alcohol dependence, uncomplicated: Secondary | ICD-10-CM | POA: Diagnosis present

## 2016-09-09 DIAGNOSIS — G5602 Carpal tunnel syndrome, left upper limb: Secondary | ICD-10-CM | POA: Diagnosis present

## 2016-09-09 DIAGNOSIS — F419 Anxiety disorder, unspecified: Secondary | ICD-10-CM | POA: Diagnosis present

## 2016-09-09 DIAGNOSIS — K7030 Alcoholic cirrhosis of liver without ascites: Secondary | ICD-10-CM | POA: Diagnosis present

## 2016-09-09 DIAGNOSIS — K7031 Alcoholic cirrhosis of liver with ascites: Secondary | ICD-10-CM | POA: Diagnosis present

## 2016-09-09 DIAGNOSIS — Z720 Tobacco use: Secondary | ICD-10-CM | POA: Diagnosis present

## 2016-09-09 DIAGNOSIS — E876 Hypokalemia: Secondary | ICD-10-CM | POA: Diagnosis present

## 2016-09-09 DIAGNOSIS — S82209A Unspecified fracture of shaft of unspecified tibia, initial encounter for closed fracture: Secondary | ICD-10-CM | POA: Insufficient documentation

## 2016-09-09 DIAGNOSIS — S52612A Displaced fracture of left ulna styloid process, initial encounter for closed fracture: Secondary | ICD-10-CM | POA: Diagnosis present

## 2016-09-09 DIAGNOSIS — M810 Age-related osteoporosis without current pathological fracture: Secondary | ICD-10-CM | POA: Diagnosis present

## 2016-09-09 DIAGNOSIS — Z8249 Family history of ischemic heart disease and other diseases of the circulatory system: Secondary | ICD-10-CM

## 2016-09-09 DIAGNOSIS — Z716 Tobacco abuse counseling: Secondary | ICD-10-CM | POA: Diagnosis not present

## 2016-09-09 DIAGNOSIS — S82409A Unspecified fracture of shaft of unspecified fibula, initial encounter for closed fracture: Secondary | ICD-10-CM

## 2016-09-09 DIAGNOSIS — D696 Thrombocytopenia, unspecified: Secondary | ICD-10-CM | POA: Diagnosis present

## 2016-09-09 DIAGNOSIS — R938 Abnormal findings on diagnostic imaging of other specified body structures: Secondary | ICD-10-CM | POA: Diagnosis not present

## 2016-09-09 DIAGNOSIS — S52502A Unspecified fracture of the lower end of left radius, initial encounter for closed fracture: Secondary | ICD-10-CM | POA: Diagnosis present

## 2016-09-09 DIAGNOSIS — G35 Multiple sclerosis: Secondary | ICD-10-CM | POA: Diagnosis present

## 2016-09-09 DIAGNOSIS — W19XXXA Unspecified fall, initial encounter: Secondary | ICD-10-CM

## 2016-09-09 DIAGNOSIS — Z79899 Other long term (current) drug therapy: Secondary | ICD-10-CM

## 2016-09-09 DIAGNOSIS — E785 Hyperlipidemia, unspecified: Secondary | ICD-10-CM | POA: Diagnosis present

## 2016-09-09 DIAGNOSIS — F319 Bipolar disorder, unspecified: Secondary | ICD-10-CM | POA: Diagnosis not present

## 2016-09-09 DIAGNOSIS — J449 Chronic obstructive pulmonary disease, unspecified: Secondary | ICD-10-CM | POA: Diagnosis present

## 2016-09-09 DIAGNOSIS — F3162 Bipolar disorder, current episode mixed, moderate: Secondary | ICD-10-CM | POA: Diagnosis present

## 2016-09-09 DIAGNOSIS — R9389 Abnormal findings on diagnostic imaging of other specified body structures: Secondary | ICD-10-CM | POA: Insufficient documentation

## 2016-09-09 HISTORY — PX: ORIF WRIST FRACTURE: SHX2133

## 2016-09-09 LAB — CBC WITH DIFFERENTIAL/PLATELET
Basophils Absolute: 0 10*3/uL (ref 0.0–0.1)
Basophils Relative: 0 %
EOS ABS: 0 10*3/uL (ref 0.0–0.7)
EOS PCT: 0 %
HCT: 37.1 % (ref 36.0–46.0)
Hemoglobin: 12.5 g/dL (ref 12.0–15.0)
LYMPHS ABS: 0.6 10*3/uL — AB (ref 0.7–4.0)
Lymphocytes Relative: 8 %
MCH: 29.8 pg (ref 26.0–34.0)
MCHC: 33.7 g/dL (ref 30.0–36.0)
MCV: 88.5 fL (ref 78.0–100.0)
MONOS PCT: 6 %
Monocytes Absolute: 0.5 10*3/uL (ref 0.1–1.0)
Neutro Abs: 7.1 10*3/uL (ref 1.7–7.7)
Neutrophils Relative %: 86 %
PLATELETS: 136 10*3/uL — AB (ref 150–400)
RBC: 4.19 MIL/uL (ref 3.87–5.11)
RDW: 15.4 % (ref 11.5–15.5)
WBC: 8.3 10*3/uL (ref 4.0–10.5)

## 2016-09-09 LAB — COMPREHENSIVE METABOLIC PANEL
ALK PHOS: 133 U/L — AB (ref 38–126)
ALT: 28 U/L (ref 14–54)
ANION GAP: 11 (ref 5–15)
AST: 39 U/L (ref 15–41)
Albumin: 4.2 g/dL (ref 3.5–5.0)
BUN: 11 mg/dL (ref 6–20)
CALCIUM: 9.1 mg/dL (ref 8.9–10.3)
CHLORIDE: 95 mmol/L — AB (ref 101–111)
CO2: 25 mmol/L (ref 22–32)
Creatinine, Ser: 0.4 mg/dL — ABNORMAL LOW (ref 0.44–1.00)
Glucose, Bld: 112 mg/dL — ABNORMAL HIGH (ref 65–99)
Potassium: 3.4 mmol/L — ABNORMAL LOW (ref 3.5–5.1)
SODIUM: 131 mmol/L — AB (ref 135–145)
Total Bilirubin: 1.8 mg/dL — ABNORMAL HIGH (ref 0.3–1.2)
Total Protein: 7.9 g/dL (ref 6.5–8.1)

## 2016-09-09 LAB — ETHANOL: Alcohol, Ethyl (B): <5 mg/dL (ref <5)

## 2016-09-09 LAB — PROTIME-INR
INR: 1.11
Prothrombin Time: 14.4 seconds (ref 11.4–15.2)

## 2016-09-09 LAB — AMMONIA: Ammonia: 41 umol/L — ABNORMAL HIGH (ref 9–35)

## 2016-09-09 SURGERY — OPEN REDUCTION INTERNAL FIXATION (ORIF) WRIST FRACTURE
Anesthesia: General | Site: Wrist | Laterality: Left

## 2016-09-09 MED ORDER — VITAMIN B-1 100 MG PO TABS
100.0000 mg | ORAL_TABLET | Freq: Every day | ORAL | Status: DC
  Administered 2016-09-10 – 2016-09-11 (×2): 100 mg via ORAL
  Filled 2016-09-09 (×2): qty 1

## 2016-09-09 MED ORDER — FENTANYL CITRATE (PF) 100 MCG/2ML IJ SOLN: 25.0000 ug | INTRAMUSCULAR | Status: DC | PRN

## 2016-09-09 MED ORDER — LORAZEPAM 2 MG/ML IJ SOLN
0.0000 mg | Freq: Four times a day (QID) | INTRAMUSCULAR | Status: DC
  Filled 2016-09-09: qty 1

## 2016-09-09 MED ORDER — FENTANYL CITRATE (PF) 100 MCG/2ML IJ SOLN
100.0000 ug | INTRAMUSCULAR | Status: AC | PRN
  Administered 2016-09-09 (×3): 100 ug via INTRAVENOUS
  Filled 2016-09-09 (×3): qty 2

## 2016-09-09 MED ORDER — CEFAZOLIN SODIUM-DEXTROSE 2-4 GM/100ML-% IV SOLN
INTRAVENOUS | Status: AC
  Filled 2016-09-09: qty 100

## 2016-09-09 MED ORDER — FOLIC ACID 1 MG PO TABS
1.0000 mg | ORAL_TABLET | Freq: Every day | ORAL | Status: DC
  Administered 2016-09-10 – 2016-09-11 (×2): 1 mg via ORAL
  Filled 2016-09-09 (×2): qty 1

## 2016-09-09 MED ORDER — TOPIRAMATE 100 MG PO TABS
100.0000 mg | ORAL_TABLET | Freq: Every day | ORAL | Status: DC
  Administered 2016-09-11: 10:00:00 100 mg via ORAL
  Filled 2016-09-09 (×2): qty 1

## 2016-09-09 MED ORDER — ROCURONIUM BROMIDE 100 MG/10ML IV SOLN
INTRAVENOUS | Status: DC | PRN
  Administered 2016-09-09: 40 mg via INTRAVENOUS

## 2016-09-09 MED ORDER — MOMETASONE FURO-FORMOTEROL FUM 100-5 MCG/ACT IN AERO
2.0000 | INHALATION_SPRAY | Freq: Two times a day (BID) | RESPIRATORY_TRACT | Status: DC
  Administered 2016-09-11: 2 via RESPIRATORY_TRACT
  Filled 2016-09-09: qty 8.8

## 2016-09-09 MED ORDER — LORAZEPAM 1 MG PO TABS
1.0000 mg | ORAL_TABLET | Freq: Four times a day (QID) | ORAL | Status: DC | PRN
  Administered 2016-09-10 (×2): 1 mg via ORAL
  Filled 2016-09-09 (×2): qty 1

## 2016-09-09 MED ORDER — PANTOPRAZOLE SODIUM 40 MG PO TBEC
40.0000 mg | DELAYED_RELEASE_TABLET | Freq: Every day | ORAL | Status: DC
  Administered 2016-09-10 – 2016-09-11 (×2): 40 mg via ORAL
  Filled 2016-09-09 (×2): qty 1

## 2016-09-09 MED ORDER — BISACODYL 5 MG PO TBEC
5.0000 mg | DELAYED_RELEASE_TABLET | Freq: Every day | ORAL | Status: DC | PRN
  Filled 2016-09-09: qty 1

## 2016-09-09 MED ORDER — FENTANYL CITRATE (PF) 100 MCG/2ML IJ SOLN
INTRAMUSCULAR | Status: AC
  Filled 2016-09-09: qty 2

## 2016-09-09 MED ORDER — LORAZEPAM 2 MG/ML IJ SOLN: 0.0000 mg | Freq: Two times a day (BID) | INTRAMUSCULAR | Status: DC

## 2016-09-09 MED ORDER — ACETAMINOPHEN 325 MG PO TABS
650.0000 mg | ORAL_TABLET | Freq: Four times a day (QID) | ORAL | Status: DC | PRN
  Administered 2016-09-10: 05:00:00 650 mg via ORAL
  Filled 2016-09-09: qty 2

## 2016-09-09 MED ORDER — BUPIVACAINE HCL (PF) 0.25 % IJ SOLN
INTRAMUSCULAR | Status: AC
  Filled 2016-09-09: qty 30

## 2016-09-09 MED ORDER — PROPOFOL 10 MG/ML IV BOLUS
INTRAVENOUS | Status: AC
  Filled 2016-09-09: qty 20

## 2016-09-09 MED ORDER — LIDOCAINE HCL (CARDIAC) 20 MG/ML IV SOLN
INTRAVENOUS | Status: DC | PRN
  Administered 2016-09-09: 60 mg via INTRAVENOUS

## 2016-09-09 MED ORDER — SUGAMMADEX SODIUM 200 MG/2ML IV SOLN
INTRAVENOUS | Status: DC | PRN
  Administered 2016-09-09: 150 mg via INTRAVENOUS

## 2016-09-09 MED ORDER — ALBUTEROL SULFATE (2.5 MG/3ML) 0.083% IN NEBU: 2.5000 mg | INHALATION_SOLUTION | Freq: Four times a day (QID) | RESPIRATORY_TRACT | Status: DC | PRN

## 2016-09-09 MED ORDER — BUSPIRONE HCL 5 MG PO TABS
15.0000 mg | ORAL_TABLET | Freq: Two times a day (BID) | ORAL | Status: DC
  Administered 2016-09-10 – 2016-09-11 (×3): 15 mg via ORAL
  Filled 2016-09-09 (×3): qty 3

## 2016-09-09 MED ORDER — CEFAZOLIN SODIUM-DEXTROSE 2-3 GM-% IV SOLR
INTRAVENOUS | Status: DC | PRN
  Administered 2016-09-09: 2 g via INTRAVENOUS

## 2016-09-09 MED ORDER — ONDANSETRON HCL 4 MG/2ML IJ SOLN
4.0000 mg | Freq: Once | INTRAMUSCULAR | Status: AC
  Administered 2016-09-09: 4 mg via INTRAVENOUS
  Filled 2016-09-09: qty 2

## 2016-09-09 MED ORDER — ADULT MULTIVITAMIN W/MINERALS CH
1.0000 | ORAL_TABLET | Freq: Every day | ORAL | Status: DC
  Administered 2016-09-10 – 2016-09-11 (×2): 1 via ORAL
  Filled 2016-09-09 (×2): qty 1

## 2016-09-09 MED ORDER — VITAMIN C 500 MG PO TABS
1000.0000 mg | ORAL_TABLET | Freq: Every day | ORAL | Status: DC
  Administered 2016-09-10 – 2016-09-11 (×2): 1000 mg via ORAL
  Filled 2016-09-09 (×2): qty 2

## 2016-09-09 MED ORDER — FENTANYL CITRATE (PF) 100 MCG/2ML IJ SOLN
100.0000 ug | Freq: Once | INTRAMUSCULAR | Status: AC
  Administered 2016-09-09: 100 ug via INTRAVENOUS
  Filled 2016-09-09: qty 2

## 2016-09-09 MED ORDER — ONDANSETRON HCL 4 MG PO TABS: 4.0000 mg | ORAL_TABLET | Freq: Four times a day (QID) | ORAL | Status: DC | PRN

## 2016-09-09 MED ORDER — FENTANYL CITRATE (PF) 100 MCG/2ML IJ SOLN
INTRAMUSCULAR | Status: DC | PRN
  Administered 2016-09-09: 100 ug via INTRAVENOUS

## 2016-09-09 MED ORDER — MODAFINIL 200 MG PO TABS: 200.0000 mg | ORAL_TABLET | Freq: Every day | ORAL | Status: DC

## 2016-09-09 MED ORDER — LISDEXAMFETAMINE DIMESYLATE 30 MG PO CAPS: 70.0000 mg | ORAL_CAPSULE | Freq: Every day | ORAL | Status: DC

## 2016-09-09 MED ORDER — HYDROMORPHONE HCL-NACL 0.5-0.9 MG/ML-% IV SOSY
PREFILLED_SYRINGE | INTRAVENOUS | Status: AC
  Filled 2016-09-09: qty 1

## 2016-09-09 MED ORDER — MORPHINE SULFATE (PF) 2 MG/ML IV SOLN: 2.0000 mg | INTRAVENOUS | Status: DC | PRN

## 2016-09-09 MED ORDER — FUROSEMIDE 40 MG PO TABS: 40.0000 mg | ORAL_TABLET | Freq: Every day | ORAL | Status: DC

## 2016-09-09 MED ORDER — MORPHINE SULFATE (PF) 2 MG/ML IV SOLN
2.0000 mg | Freq: Once | INTRAVENOUS | Status: AC
  Administered 2016-09-09: 2 mg via INTRAVENOUS
  Filled 2016-09-09: qty 1

## 2016-09-09 MED ORDER — BUPIVACAINE HCL (PF) 0.25 % IJ SOLN: INTRAMUSCULAR | Status: DC | PRN

## 2016-09-09 MED ORDER — THIAMINE HCL 100 MG/ML IJ SOLN
100.0000 mg | Freq: Every day | INTRAMUSCULAR | Status: DC
  Filled 2016-09-09 (×2): qty 1

## 2016-09-09 MED ORDER — SENNOSIDES-DOCUSATE SODIUM 8.6-50 MG PO TABS: 1.0000 | ORAL_TABLET | Freq: Every evening | ORAL | Status: DC | PRN

## 2016-09-09 MED ORDER — DEXAMETHASONE SODIUM PHOSPHATE 10 MG/ML IJ SOLN
INTRAMUSCULAR | Status: DC | PRN
  Administered 2016-09-09: 10 mg via INTRAVENOUS

## 2016-09-09 MED ORDER — MIDAZOLAM HCL 2 MG/2ML IJ SOLN
INTRAMUSCULAR | Status: AC
  Filled 2016-09-09: qty 2

## 2016-09-09 MED ORDER — CEFAZOLIN SODIUM-DEXTROSE 1-4 GM/50ML-% IV SOLN
1.0000 g | Freq: Three times a day (TID) | INTRAVENOUS | Status: DC
  Administered 2016-09-10 – 2016-09-11 (×5): 1 g via INTRAVENOUS
  Filled 2016-09-09 (×6): qty 50

## 2016-09-09 MED ORDER — OXYCODONE HCL 5 MG PO TABS
10.0000 mg | ORAL_TABLET | ORAL | Status: DC | PRN
  Administered 2016-09-10 – 2016-09-11 (×5): 10 mg via ORAL
  Filled 2016-09-09 (×5): qty 2

## 2016-09-09 MED ORDER — ALBUTEROL SULFATE HFA 108 (90 BASE) MCG/ACT IN AERS
INHALATION_SPRAY | RESPIRATORY_TRACT | Status: AC
  Filled 2016-09-09: qty 6.7

## 2016-09-09 MED ORDER — ALBUTEROL SULFATE HFA 108 (90 BASE) MCG/ACT IN AERS: 2.0000 | INHALATION_SPRAY | Freq: Four times a day (QID) | RESPIRATORY_TRACT | Status: DC | PRN

## 2016-09-09 MED ORDER — PROMETHAZINE HCL 25 MG/ML IJ SOLN: 6.2500 mg | INTRAMUSCULAR | Status: DC | PRN

## 2016-09-09 MED ORDER — FLUOXETINE HCL 20 MG PO CAPS
20.0000 mg | ORAL_CAPSULE | Freq: Two times a day (BID) | ORAL | Status: DC
  Administered 2016-09-10 – 2016-09-11 (×3): 20 mg via ORAL
  Filled 2016-09-09 (×3): qty 1

## 2016-09-09 MED ORDER — BUPIVACAINE-EPINEPHRINE (PF) 0.25% -1:200000 IJ SOLN
INTRAMUSCULAR | Status: AC
  Filled 2016-09-09: qty 30

## 2016-09-09 MED ORDER — LACTATED RINGERS IV SOLN
INTRAVENOUS | Status: DC | PRN
  Administered 2016-09-09 (×2): via INTRAVENOUS

## 2016-09-09 MED ORDER — DOCUSATE SODIUM 100 MG PO CAPS
100.0000 mg | ORAL_CAPSULE | Freq: Two times a day (BID) | ORAL | Status: DC
  Administered 2016-09-10 – 2016-09-11 (×3): 100 mg via ORAL
  Filled 2016-09-09 (×3): qty 1

## 2016-09-09 MED ORDER — LORAZEPAM 2 MG/ML IJ SOLN
1.0000 mg | Freq: Once | INTRAMUSCULAR | Status: AC
  Administered 2016-09-09: 1 mg via INTRAVENOUS
  Filled 2016-09-09: qty 1

## 2016-09-09 MED ORDER — FAMOTIDINE 20 MG PO TABS
20.0000 mg | ORAL_TABLET | Freq: Every day | ORAL | Status: DC
  Administered 2016-09-10: 11:00:00 20 mg via ORAL
  Filled 2016-09-09 (×2): qty 1

## 2016-09-09 MED ORDER — MIDAZOLAM HCL 5 MG/5ML IJ SOLN
INTRAMUSCULAR | Status: DC | PRN
  Administered 2016-09-09: 2 mg via INTRAVENOUS

## 2016-09-09 MED ORDER — LORAZEPAM 2 MG/ML IJ SOLN
1.0000 mg | Freq: Four times a day (QID) | INTRAMUSCULAR | Status: DC | PRN
  Administered 2016-09-10: 1 mg via INTRAVENOUS

## 2016-09-09 MED ORDER — RISPERIDONE 0.25 MG PO TABS
0.5000 mg | ORAL_TABLET | Freq: Two times a day (BID) | ORAL | Status: DC
  Administered 2016-09-10 – 2016-09-11 (×3): 0.5 mg via ORAL
  Filled 2016-09-09 (×3): qty 2

## 2016-09-09 MED ORDER — ALBUTEROL SULFATE HFA 108 (90 BASE) MCG/ACT IN AERS
INHALATION_SPRAY | RESPIRATORY_TRACT | Status: DC | PRN
  Administered 2016-09-09: 4 via RESPIRATORY_TRACT

## 2016-09-09 MED ORDER — POTASSIUM CHLORIDE IN NACL 20-0.9 MEQ/L-% IV SOLN
INTRAVENOUS | Status: AC
  Administered 2016-09-09: via INTRAVENOUS
  Filled 2016-09-09: qty 1000

## 2016-09-09 MED ORDER — PROPOFOL 10 MG/ML IV BOLUS
INTRAVENOUS | Status: DC | PRN
  Administered 2016-09-09: 120 mg via INTRAVENOUS

## 2016-09-09 MED ORDER — GABAPENTIN 100 MG PO CAPS: 200.0000 mg | ORAL_CAPSULE | Freq: Two times a day (BID) | ORAL | Status: DC | PRN

## 2016-09-09 MED ORDER — SPIRONOLACTONE 100 MG PO TABS: 100.0000 mg | ORAL_TABLET | Freq: Every day | ORAL | Status: DC

## 2016-09-09 MED ORDER — ONDANSETRON HCL 4 MG/2ML IJ SOLN
4.0000 mg | Freq: Four times a day (QID) | INTRAMUSCULAR | Status: DC | PRN
  Administered 2016-09-09: 4 mg via INTRAVENOUS

## 2016-09-09 MED ORDER — ACETAMINOPHEN 650 MG RE SUPP: 650.0000 mg | Freq: Four times a day (QID) | RECTAL | Status: DC | PRN

## 2016-09-09 MED ORDER — 0.9 % SODIUM CHLORIDE (POUR BTL) OPTIME
TOPICAL | Status: DC | PRN
  Administered 2016-09-09: 1000 mL

## 2016-09-09 MED ORDER — HYDROMORPHONE HCL-NACL 0.5-0.9 MG/ML-% IV SOSY: 0.2500 mg | PREFILLED_SYRINGE | INTRAVENOUS | Status: DC | PRN

## 2016-09-09 SURGICAL SUPPLY — 61 items
BAG ZIPLOCK 12X15 (MISCELLANEOUS) ×3 IMPLANT
BANDAGE ACE 3X5.8 VEL STRL LF (GAUZE/BANDAGES/DRESSINGS) ×3 IMPLANT
BANDAGE ACE 4X5 VEL STRL LF (GAUZE/BANDAGES/DRESSINGS) ×3 IMPLANT
BIT DRILL 2.2 SS TIBIAL (BIT) ×3 IMPLANT
BNDG GAUZE ELAST 4 BULKY (GAUZE/BANDAGES/DRESSINGS) ×3 IMPLANT
COVER SURGICAL LIGHT HANDLE (MISCELLANEOUS) ×3 IMPLANT
CUFF TOURN SGL QUICK 18 (TOURNIQUET CUFF) ×3 IMPLANT
DECANTER SPIKE VIAL GLASS SM (MISCELLANEOUS) ×3 IMPLANT
DRAPE OEC MINIVIEW 54X84 (DRAPES) ×3 IMPLANT
DRAPE U-SHAPE 47X51 STRL (DRAPES) ×3 IMPLANT
DRSG ADAPTIC 3X8 NADH LF (GAUZE/BANDAGES/DRESSINGS) ×3 IMPLANT
DRSG PAD ABDOMINAL 8X10 ST (GAUZE/BANDAGES/DRESSINGS) ×3 IMPLANT
ELECT REM PT RETURN 15FT ADLT (MISCELLANEOUS) ×3 IMPLANT
EVACUATOR 1/8 PVC DRAIN (DRAIN) ×3 IMPLANT
GAUZE SPONGE 4X4 12PLY STRL (GAUZE/BANDAGES/DRESSINGS) ×3 IMPLANT
GAUZE SPONGE 4X4 16PLY XRAY LF (GAUZE/BANDAGES/DRESSINGS) ×6 IMPLANT
GAUZE XEROFORM 5X9 LF (GAUZE/BANDAGES/DRESSINGS) ×3 IMPLANT
GLOVE BIO SURGEON STRL SZ8 (GLOVE) ×3 IMPLANT
GOWN STRL REUS W/TWL XL LVL3 (GOWN DISPOSABLE) ×3 IMPLANT
KIT BASIN OR (CUSTOM PROCEDURE TRAY) ×3 IMPLANT
KWIRE 4.0 X .045IN (WIRE) ×6 IMPLANT
KWIRE 4.0 X .062IN (WIRE) ×6 IMPLANT
MANIFOLD NEPTUNE II (INSTRUMENTS) ×3 IMPLANT
NS IRRIG 1000ML POUR BTL (IV SOLUTION) ×3 IMPLANT
PACK ORTHO EXTREMITY (CUSTOM PROCEDURE TRAY) ×3 IMPLANT
PAD CAST 3X4 CTTN HI CHSV (CAST SUPPLIES) ×1 IMPLANT
PAD CAST 4YDX4 CTTN HI CHSV (CAST SUPPLIES) ×1 IMPLANT
PADDING CAST COTTON 3X4 STRL (CAST SUPPLIES) ×2
PADDING CAST COTTON 4X4 STRL (CAST SUPPLIES) ×2
PEG LOCKING SMOOTH 2.2X18 (Peg) ×3 IMPLANT
PEG LOCKING SMOOTH 2.2X20 (Screw) ×15 IMPLANT
PEG LOCKING SMOOTH 2.2X22 (Screw) ×3 IMPLANT
PLATE NARROW DVR LEFT (Plate) ×3 IMPLANT
POSITIONER SURGICAL ARM (MISCELLANEOUS) ×3 IMPLANT
PUTTY DBM STAGRAFT PLUS 5CC (Putty) ×3 IMPLANT
SCREW LOCK 12X2.7X 3 LD (Screw) ×2 IMPLANT
SCREW LOCK 14X2.7X 3 LD TPR (Screw) ×1 IMPLANT
SCREW LOCKING 2.7X12MM (Screw) ×4 IMPLANT
SCREW LOCKING 2.7X13MM (Screw) ×3 IMPLANT
SCREW LOCKING 2.7X14 (Screw) ×2 IMPLANT
SCREW LOCKING 2.7X15MM (Screw) ×3 IMPLANT
SPLINT FIBERGLASS 4X15 (CAST SUPPLIES) ×3 IMPLANT
STOCKINETTE TUBULAR SYNTH 3IN (CAST SUPPLIES) ×3 IMPLANT
SUT BONE WAX W31G (SUTURE) ×3 IMPLANT
SUT ETHILON 6 0 PS 3 18 (SUTURE) ×3 IMPLANT
SUT MERSILENE 4 0 P 3 (SUTURE) ×3 IMPLANT
SUT MNCRL AB 4-0 PS2 18 (SUTURE) ×3 IMPLANT
SUT PROLENE 3 0 PS 2 (SUTURE) ×12 IMPLANT
SUT PROLENE 4 0 P 3 18 (SUTURE) ×3 IMPLANT
SUT PROLENE 4 0 RB 1 (SUTURE) ×2
SUT PROLENE 4-0 RB1 .5 CRCL 36 (SUTURE) ×1 IMPLANT
SUT VIC AB 0 CT1 27 (SUTURE) ×4
SUT VIC AB 0 CT1 27XBRD ANTBC (SUTURE) ×2 IMPLANT
SUT VIC AB 2-0 CT1 27 (SUTURE) ×2
SUT VIC AB 2-0 CT1 TAPERPNT 27 (SUTURE) ×1 IMPLANT
SUT VIC AB 3-0 SH 27 (SUTURE) ×2
SUT VIC AB 3-0 SH 27XBRD (SUTURE) ×1 IMPLANT
SYSTEM CHEST DRAIN TLS 7FR (DRAIN) ×3 IMPLANT
TOWEL OR 17X26 10 PK STRL BLUE (TOWEL DISPOSABLE) ×3 IMPLANT
UNDERPAD 30X30 (UNDERPADS AND DIAPERS) ×3 IMPLANT
WATER STERILE IRR 1500ML POUR (IV SOLUTION) ×3 IMPLANT

## 2016-09-09 NOTE — H&P (Signed)
Anne Black is an 56 y.o. female.   Chief Complaint: " I fell" HPI: The patient is a 56 year old female who presents to the emergency room setting for evaluation of her left upper extremity after falling. She states that she tripped at [place] onto the left upper extremity. She states she had immediate pain and deformity noted about the left wrist. She presented to the emergency room setting for evaluation and is noted have a displaced left distal radius fracture in nature. He states that she "scuffed her left knee". She complains of general tenderness about the elbow and left shoulder. She states that she does have a some increasing numbness and tingling about the thumb index and middle finger of the left hand. Should note she does have a history of multiple sclerosis and states that she typically has some degree of numbness and tingling about her hands in general, however, she notes a slight increase in numbness about the finger since the fall. She denies any significant neck pain. She states that her abdomen is tender out the right upper and lower quadrant she states that her low back is somewhat tender. She denies any numbness or tingling of the lower extremities other than her status quo. We have reviewed her medical history in depth. She does relate she in addition to the listed medical history has a history of COPD she still utilizes tobacco she smokes approximately 2 packs per week and in addition states she" Vapes" daily. She has a history of alcoholism per chart review. She states that she has not had anything to drink today she states that she last drank the day before yesterday approximately 3 glasses of wine. She denies pain about the right upper extremity. She denies any nausea, vomiting, significant anxiety or tremors at this juncture.  Past Medical History:  Diagnosis Date  . ADHD (attention deficit hyperactivity disorder)   . Anxiety   . Arthritis   . Ascites   . Bipolar  disorder (HCC)   . Cirrhosis (HCC)   . Hypokalemia   . Migraine   . Multiple sclerosis (HCC)   . Osteoporosis   History of hypoxia history of low back pain history of alcoholism, history of bipolar disorder, history of macrocytic anemia secondary to alcohol abuse, history of tobacco use, history of nicotine abuse, history of ventral hernia without obstruction, history of atrial flutter, history of osteoporosis, history of multiple sclerosis  Past Surgical History:  Procedure Laterality Date  . CESAREAN SECTION  [id number]  . MYRINGOTOMY WITH TUBE PLACEMENT Bilateral   . TONSILLECTOMY      Family History  Problem Relation Age of Onset  . Arrhythmia Mother   . Heart disease Father   . Hypertension Father    Social History: 2 pack cigarettes weekly, "Vapes daily", history of alcoholism and states she drinks approximately 3 glasses of wine per day. She is divorced she has 5 children. She is currently disabled secondary to multiple sclerosis  Allergies: No Known Allergies   (Not in a hospital admission)  No results found for this or any previous visit (from the past 48 hour(s)). Dg Wrist Complete Left  Result Date: 09/09/2016 CLINICAL DATA:  Status post fall with wrist pain. EXAM: LEFT WRIST - COMPLETE 3+ VIEW COMPARISON:  None. FINDINGS: There is an impacted comminuted intra-articular fracture of the distal left radius. There is a 1/3 shaft posterior dislocation of the distal fracture fragment. There is an accompanied avulsion fracture of the ulnar styloid process. Associated soft  tissue swelling. IMPRESSION: Impacted comminuted intra-articular fracture of the distal left radius. Accompanied avulsion fracture of the ulnar styloid process. Electronically Signed   By: Ted Mcalpine M.D.   On: 09/09/2016 14:13    Review of Systems  HENT: Negative.   Eyes: Negative.   Respiratory:       See past medical history  Cardiovascular:       See past medical history  Gastrointestinal:  Negative.   Genitourinary: Negative.   Musculoskeletal:       See history of present illness  Skin: Negative.   Neurological: Positive for weakness.       See past medical history  Endo/Heme/Allergies: Bruises/bleeds easily.  Psychiatric/Behavioral: Positive for depression and substance abuse.       Blood pressure 134/83, pulse 88, temperature 97.9 F (36.6 C), temperature source Oral, resp. rate 18, SpO2 98 %. Physical Exam  The patient is alert and oriented in no acute distress. The patient complains of pain in the affected upper extremity.  The patient is noted to have a normal HEENT exam. Lung fields show equal chest expansion and no shortness of breath. Abdomen exam: Tender about the right upper quadrant, history of cirrhosis, she has a large umbilical hernia present without obvious gangrene. Lower extremity examination does not show any fracture dislocation or blood clot symptoms, left knee is tender with a mild abrasion about the anterior patellar region no effusion, no significant ecchymosis or edema is present she is able to elicit straight leg raises bilaterally. Internal/external rotation about the hips are nontender. Transient is intact to resisted testing about the ankles knees and hips. Left upper extremity shows that she has a obvious deformity about the left wrist present. Gross sensation is intact, refill is intact she has intermittent ecchymosis about her forearms and hands. Obviously tender about the wrist wrist range of motion is not performed, elbow flexion-extension is intact she's nontender with palpation examination about the elbow humerus or shoulder.. Pelvis is stable and the neck and back are stable and nontender. Assessment/Plan Comminuted displaced left distal radius fracture with an increase of paresthesias about the median nerve distribution Patient Active Problem List   Diagnosis Date Noted  . Tibia/fibula fracture 09/09/2016  . Umbilical hernia without  obstruction and without gangrene 05/12/2016  . Itching 02/24/2016  . Ventral hernia without obstruction or gangrene 02/24/2016  . Palliative care encounter   . Ascites   . Tachypnea   . Typical atrial flutter (HCC)   . Hypomagnesemia   . Hypoxia   . Dehydration 01/22/2016  . Low back ache 12/30/2015  . Non-intractable vomiting with nausea   . Alcohol use disorder (HCC) 12/15/2015  . Acute alcoholism (HCC)   . Abdominal distension 11/04/2015  . Alcohol abuse   . Dyspnea   . Acute respiratory failure (HCC) 10/15/2015  . Ascites due to alcoholic cirrhosis (HCC) 10/15/2015  . Osteoporosis   . Bipolar I disorder (HCC)   . Multiple sclerosis (HCC)   . Alcoholic cirrhosis of liver without ascites (HCC) 08/22/2015  . Bipolar disorder, current episode mixed, moderate (HCC) 08/01/2015  . Alcohol use disorder, severe, dependence (HCC) 07/31/2015  . Macrocytic anemia- due to alcohol abuse with normal B12 & folate levels 05/31/2015  . Severe protein-calorie malnutrition (HCC) 05/31/2015  . Hypokalemia 05/26/2015  . Encephalopathy, hepatic (HCC) 05/26/2015  . Alcohol withdrawal (HCC) 05/18/2015  . Abdominal pain 05/18/2015  . Anxiety 05/18/2015  . UTI (lower urinary tract infection) 05/18/2015  . Stimulant abuse 12/27/2014  .  Nicotine abuse 12/27/2014  . Hyperprolactinemia (HCC) 11/15/2014  . Dyslipidemia   . Tobacco abuse 01/23/2014  . Noncompliance with therapeutic plan 04/04/2013  . Unspecified hereditary and idiopathic peripheral neuropathy 05/01/2012  . GERD (gastroesophageal reflux disease) 05/01/2012  . Osteoarthrosis, unspecified whether generalized or localized, involving lower leg 05/01/2012  Have a lengthy discussion with the patient in regards to her left upper extremity we have discussed with her given the degree of comminution and displacement we would recommend attempts at surgical fixation pending her medical workup. Currently there is no available labs and we have  discussed with her preoperative and all labs and chest x-ray as well as consult per the hospitalist. We will look towards operative fixation this evening. If this is delayed we will attempt closed reduction measures in an effort to decrease the deformity about the wrist. I have had a length discussion with the patient she understands all issues. We are planning surgery for your upper extremity. The risk and benefits of surgery to include risk of bleeding, infection, anesthesia,  damage to normal structures and failure of the surgery to accomplish its intended goals of relieving symptoms and restoring function have been discussed in detail. With this in mind we plan to proceed. I have specifically discussed with the patient the pre-and postoperative regime and the dos and don'ts and risk and benefits in great detail. Risk and benefits of surgery also include risk of dystrophy(CRPS), chronic nerve pain, failure of the healing process to go onto completion and other inherent risks of surgery The relavent the pathophysiology of the disease/injury process, as well as the alternatives for treatment and postoperative course of action has been discussed in great detail with the patient who desires to proceed.  We will do everything in our power to help you (the patient) restore function to the upper extremity. It is a pleasure to see this patient today.   Marga Gramajo L, PA-C 09/09/2016, 4:08 PM

## 2016-09-09 NOTE — Anesthesia Preprocedure Evaluation (Addendum)
Anesthesia Evaluation  Patient identified by MRN, date of birth, ID band Patient awake    Reviewed: Allergy & Precautions, NPO status , Patient's Chart, lab work & pertinent test results  Airway Mallampati: II  TM Distance: >3 FB Neck ROM: Full    Dental  (+) Teeth Intact, Dental Advisory Given, Caps   Pulmonary asthma , COPD,  COPD inhaler, Current Smoker,    Pulmonary exam normal breath sounds clear to auscultation       Cardiovascular negative cardio ROS Normal cardiovascular exam Rhythm:Regular Rate:Normal     Neuro/Psych  Headaches, PSYCHIATRIC DISORDERS Anxiety Bipolar Disorder Multiple sclerosis  Neuromuscular disease    GI/Hepatic GERD  Medicated,(+) Cirrhosis   ascites  substance abuse  alcohol use,   Endo/Other  negative endocrine ROS  Renal/GU negative Renal ROS     Musculoskeletal  (+) Arthritis , Left hand numbness   Abdominal   Peds  (+) ADHD Hematology  (+) Blood dyscrasia (thrombocytopenia), ,   Anesthesia Other Findings Day of surgery medications reviewed with the patient.  Reproductive/Obstetrics                            Anesthesia Physical Anesthesia Plan  ASA: III and emergent  Anesthesia Plan: General   Post-op Pain Management:    Induction: Intravenous  PONV Risk Score and Plan: 4 or greater and Ondansetron, Dexamethasone, Midazolam and Treatment may vary due to age or medical condition  Airway Management Planned: Oral ETT  Additional Equipment:   Intra-op Plan:   Post-operative Plan: Extubation in OR  Informed Consent: I have reviewed the patients History and Physical, chart, labs and discussed the procedure including the risks, benefits and alternatives for the proposed anesthesia with the patient or authorized representative who has indicated his/her understanding and acceptance.   Dental advisory given  Plan Discussed with: CRNA  Anesthesia  Plan Comments: (Risks/benefits of general anesthesia discussed with patient including risk of damage to teeth, lips, gum, and tongue, nausea/vomiting, allergic reactions to medications, and the possibility of heart attack, stroke and death.  All patient questions answered.  Patient wishes to proceed.)        Anesthesia Quick Evaluation
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
## 2016-09-09 NOTE — H&P (Signed)
History and Physical    Anne Black ZOX:[id number] DOB: [birth year] DOA: 09/09/2016  PCP: Adela Glimpse, PA-C   Patient coming from: Home  Chief Complaint: Fall with severe left forearm pain  HPI: Anne Black is a 56 y.o. female with medical history significant for alcohol dependence, alcoholic cirrhosis, bipolar disorder, and multiple sclerosis, now presenting to the emergency department with severe left forearm pain after mechanical fall. Patient reports that she had been in her usual state of health and was in a parking lot when she tripped, falling onto an outstretched left arm. She experienced immediate and severe pain at this site and EMS was called out. She denies any recent fevers or chills, denies any chest pain or palpitations, and denies any increased cough or dyspnea. No abdominal pain, dysuria, or flank pain. She denies hitting her head and denies headaches, change in vision or hearing, focal numbness or weakness. She reports that she is able to ascend a flight of stairs and walk a city block without shortness of breath or angina. Denies lower extremity swelling, tenderness, or orthopnea.  ED Course: Upon arrival to the ED, patient is found to be afebrile, saturating 90s on 2 L/m supplemental oxygen, and with vitals otherwise stable. EKG features a sinus rhythm and chest x-ray is notable for subtle density just above the right hemidiaphragm and chronic COPD changes. Radiographs of the left forearm demonstrate impacted and comminuted intra-articular fracture of the distal left radius as well as avulsion fracture of the ulnar styloid process. Chemistry panel reveals a sodium of 131, potassium 3.4, and total bilirubin 1.8. CBC is notable for a mild leukopenia with LIMA to 36,000. Ammonia is slightly elevated to 41 and INR returns at 1.11. Patient was treated with Ativan and multiple doses of IV fentanyl in the ED. Hand surgery was consulted and requested a medical admission,  indicating the complaint of pruritic. Patient remained hemodynamically stable in the ED with no apparent respiratory distress and will be admitted to the medical-surgical unit for ongoing evaluation and management of severe left forearm pain secondary to a complicated distal radius fracture.  Review of Systems:  All other systems reviewed and apart from HPI, are negative.  Past Medical History:  Diagnosis Date  . ADHD (attention deficit hyperactivity disorder)   . Anxiety   . Arthritis   . Ascites   . Bipolar disorder (HCC)   . Cirrhosis (HCC)   . Hypokalemia   . Migraine   . Multiple sclerosis (HCC)   . Osteoporosis     Past Surgical History:  Procedure Laterality Date  . CESAREAN SECTION  [id number]  . MYRINGOTOMY WITH TUBE PLACEMENT Bilateral   . TONSILLECTOMY       reports that she quit smoking about 3 months ago. Her smoking use included Cigarettes. She has a 15.00 pack-year smoking history. She has never used smokeless tobacco. She reports that she does not drink alcohol or use drugs.  No Known Allergies  Family History  Problem Relation Age of Onset  . Arrhythmia Mother   . Heart disease Father   . Hypertension Father      Prior to Admission medications   Medication Sig Start Date End Date Taking? Authorizing Provider  acetaminophen (TYLENOL) 325 MG tablet Take 650 mg by mouth every 6 (six) hours as needed for moderate pain.   Yes [provider]  albuterol (PROVENTIL HFA;VENTOLIN HFA) 108 (90 Base) MCG/ACT inhaler Inhale 2 puffs into the lungs every 6 (six) hours as  needed for wheezing or shortness of breath.   Yes [provider]  busPIRone (BUSPAR) 15 MG tablet TAKE 1 TABLET(15 MG) BY MOUTH TWICE DAILY 04/19/16  Yes [name], MD  FLUoxetine (PROZAC) 20 MG capsule Take 20 mg by mouth 2 (two) times daily. 03/30/16  Yes [provider]  Fluticasone-Salmeterol (ADVAIR) 100-50 MCG/DOSE AEPB Inhale 2 Inhalers into the lungs 2 (two)  times daily. 11/05/14  Yes [provider]  furosemide (LASIX) 40 MG tablet Take 1 tablet (40 mg total) by mouth daily. 06/17/16  Yes Midge Minium, MD  gabapentin (NEURONTIN) 100 MG capsule Take 2 capsules (200 mg total) by mouth 3 (three) times daily. Patient taking differently: Take 200 mg by mouth 2 (two) times daily as needed (for ;pan).  01/07/16  Yes [name], MD  lisdexamfetamine (VYVANSE) 70 MG capsule Take 70 mg by mouth daily.   Yes [provider]  Melatonin 3 MG TABS Take 1 tablet (3 mg total) by mouth at bedtime as needed (sleep). 10/24/15  Yes Garth Bigness, MD  modafinil (PROVIGIL) 200 MG tablet Take 200 mg by mouth daily. 08/04/16  Yes [provider]  pantoprazole (PROTONIX) 40 MG tablet Take 1 tablet (40 mg total) by mouth daily. 12/30/15  Yes Haney, Alyssa A, MD  risperiDONE (RISPERDAL) 0.5 MG tablet Take 1 tablet (0.5 mg total) by mouth 3 (three) times daily as needed (agitation). Patient taking differently: Take 0.5 mg by mouth 2 (two) times daily.  02/06/16  Yes Renne Musca, MD  spironolactone (ALDACTONE) 100 MG tablet Take 1 tablet (100 mg total) by mouth daily. 06/17/16  Yes Midge Minium, MD  topiramate (TOPAMAX) 100 MG tablet Take 1 tablet (100 mg total) by mouth daily. 01/07/16  Yes [name], MD  amantadine (SYMMETREL) 100 MG capsule Take 1 capsule (100 mg total) by mouth 2 (two) times daily. Patient not taking: Reported on 08/08/2016 08/04/15   Oneta Rack, NP  cholestyramine light (PREVALITE) 4 g packet Take 1 packet (4 g total) by mouth 3 (three) times daily. With meals Patient not taking: Reported on 08/08/2016 03/11/16   [name], MD  hydrOXYzine (ATARAX/VISTARIL) 50 MG tablet Take 1 tablet (50 mg total) by mouth 3 (three) times daily as needed for itching. Patient not taking: Reported on 09/09/2016 03/11/16   [name], MD  ondansetron (ZOFRAN ODT) 4 MG disintegrating tablet Take 1 tablet (4 mg  total) by mouth every 8 (eight) hours as needed for nausea or vomiting. 4mg  ODT q4 hours prn nausea/vomit Patient not taking: Reported on 08/08/2016 03/30/16   Midge Minium, MD  rifaximin (XIFAXAN) 550 MG TABS tablet Take 1 tablet (550 mg total) by mouth 2 (two) times daily. Patient not taking: Reported on 08/08/2016 06/05/15   Catarina Hartshorn, MD    Physical Exam: Vitals:   09/09/16 1316 09/09/16 1457 09/09/16 1457 09/09/16 1809  BP: 134/83   100/60  Pulse: 88   73  Resp: 18   12  Temp: 97.9 F (36.6 C)     TempSrc: Oral     SpO2: 94% (!) 86% 98% 97%      Constitutional: No respiratory distress, calm, appears uncomfortable Eyes: PERTLA, lids and conjunctivae normal ENMT: Mucous membranes are moist. Posterior pharynx clear of any exudate or lesions.   Neck: normal, supple, no masses, no thyromegaly Respiratory: Slightly diminished bilaterally, no rhonchi, crackles, or wheezes. Normal respiratory effort. No accessory muscle use.  Cardiovascular: S1 & S2  heard, regular rate and rhythm. No extremity edema. No significant JVD. Abdomen: No distension, no tenderness, no masses palpated. Bowel sounds normal.  Musculoskeletal: Left forearm immobilized, finger flexion/extension intact with good cap refill; sensation to light touch symmetric with right. No joint deformity lower extremities.   Skin: no significant rashes, lesions, ulcers. Warm, dry, well-perfused. Neurologic: CN 2-12 grossly intact. Sensation to light touch diminished in distal extremities, DTR normal. Strength 5/5 in all 4 limbs.  Psychiatric: Alert and oriented x 3. Cooperative.     Labs on Admission: I have personally reviewed following labs and imaging studies  CBC:  Recent Labs Lab 09/09/16 1720  WBC 8.3  NEUTROABS 7.1  HGB 12.5  HCT 37.1  MCV 88.5  PLT 136*   Basic Metabolic Panel:  Recent Labs Lab 09/09/16 1720  NA 131*  K 3.4*  CL 95*  CO2 25  GLUCOSE 112*  BUN 11  CREATININE 0.40*  CALCIUM 9.1    GFR: CrCl cannot be calculated (Unknown ideal weight.). Liver Function Tests:  Recent Labs Lab 09/09/16 1720  AST 39  ALT 28  ALKPHOS 133*  BILITOT 1.8*  PROT 7.9  ALBUMIN 4.2   No results for input(s): LIPASE, AMYLASE in the last 168 hours.  Recent Labs Lab 09/09/16 1720  AMMONIA 41*   Coagulation Profile:  Recent Labs Lab 09/09/16 1720  INR 1.11   Cardiac Enzymes: No results for input(s): CKTOTAL, CKMB, CKMBINDEX, TROPONINI in the last 168 hours. BNP (last 3 results) No results for input(s): PROBNP in the last 8760 hours. HbA1C: No results for input(s): HGBA1C in the last 72 hours. CBG: No results for input(s): GLUCAP in the last 168 hours. Lipid Profile: No results for input(s): CHOL, HDL, LDLCALC, TRIG, CHOLHDL, LDLDIRECT in the last 72 hours. Thyroid Function Tests: No results for input(s): TSH, T4TOTAL, FREET4, T3FREE, THYROIDAB in the last 72 hours. Anemia Panel: No results for input(s): VITAMINB12, FOLATE, FERRITIN, TIBC, IRON, RETICCTPCT in the last 72 hours. Urine analysis:    Component Value Date/Time   COLORURINE COLORLESS (A) 08/08/2016 1245   APPEARANCEUR CLEAR 08/08/2016 1245   LABSPEC 1.004 (L) 08/08/2016 1245   PHURINE 8.0 08/08/2016 1245   GLUCOSEU NEGATIVE 08/08/2016 1245   HGBUR NEGATIVE 08/08/2016 1245   BILIRUBINUR NEGATIVE 08/08/2016 1245   BILIRUBINUR moderate (A) 06/19/2015 1906   KETONESUR NEGATIVE 08/08/2016 1245   PROTEINUR NEGATIVE 08/08/2016 1245   UROBILINOGEN >=8.0 06/19/2015 1906   UROBILINOGEN 0.2 09/14/2011 2141   NITRITE NEGATIVE 08/08/2016 1245   LEUKOCYTESUR TRACE (A) 08/08/2016 1245   Sepsis Labs: @LABRCNTIP (procalcitonin:4,lacticidven:4) )No results found for this or any previous visit (from the past 240 hour(s)).   Radiological Exams on Admission: Dg Wrist Complete Left  Result Date: 09/09/2016 CLINICAL DATA:  Status post fall with wrist pain. EXAM: LEFT WRIST - COMPLETE 3+ VIEW COMPARISON:  None. FINDINGS:  There is an impacted comminuted intra-articular fracture of the distal left radius. There is a 1/3 shaft posterior dislocation of the distal fracture fragment. There is an accompanied avulsion fracture of the ulnar styloid process. Associated soft tissue swelling. IMPRESSION: Impacted comminuted intra-articular fracture of the distal left radius. Accompanied avulsion fracture of the ulnar styloid process. Electronically Signed   By: Ted Mcalpine M.D.   On: 09/09/2016 14:13   Dg Chest Port 1 View  Result Date: 09/09/2016 CLINICAL DATA:  Hypoxia.  Long-term smoker.  History of COPD. EXAM: PORTABLE CHEST 1 VIEW COMPARISON:  Chest x-ray of January 28, 2016 FINDINGS: The lungs are  well-expanded. There is subtle ovoid increased density in the right infrahilar region. The interstitial markings are coarse. There is no pleural effusion. The heart is normal in size. The pulmonary vascularity is not engorged. There is calcification in the wall of the aortic arch. The trachea is midline. The bony thorax exhibits no acute abnormality. IMPRESSION: COPD. Subtle increased density just above the right hemidiaphragm merits further evaluation either with a PA and lateral chest x-ray or chest CT scan to exclude an abnormal nodule. There is no CHF. Thoracic aortic atherosclerosis. Electronically Signed   By: David  [place] M.D.   On: 09/09/2016 16:29    EKG: Independently reviewed. Sinus rhythm.   Assessment/Plan  1. Distal left radius fracture  - Pt presents with severe left forearm pain after mechanical ground-level fall - Found to have impacted and comminuted intraarticular fracture of distal radius and avulsion fracture of ulnar styloid process  - Hand surgery is consulting and much appreciated, asked for medical admission   - Based on the available data, Ms. Ladson presents an estimated risk probability of 1.5% for perioperative MI or cardiac arrest per Nolon Nations al  - Plan to provide gentle IVF hydration,  correct electrolytes, control BP with prn hydralazine IVP's, continue prn analgesia  2. COPD  - Stable with no wheezing, cough, or dyspnea  - Continue scheduled ICS/LABA BID, and prn albuterol nebs   3. Alcohol dependence  - Last drink was yesterday or day before  - No signs of withdrawal on admission  - Monitor with CIWA and prn Ativan - Supplement vitamins and minerals   4. Alcoholic cirrhosis  - Appears well-compensated  - Will be given a gentle IVF hydration while NPO, can likely resume her diuretics in am  - Continue daily PPI    5. Hyponatremia  - Serum potassium is 131 on admission  - Pt appears hypovolemic on admission and will be hydrated with NS overnight - Repeat chem panel in am    6. Hypokalemia - Serum potassium is 3.4 on admission - KCl added to her IVF  - Repeat chemistries in am   7. Bipolar disorder  - Pt denies hallucinations, SI, or HI  - Continue Risperdal, Topamax, Buspar   8. Multiple sclerosis  - [facility]s [facility] at [facility] - Has been stable and managed with supportive care only    DVT prophylaxis: SCD's   Code Status: Full  Family Communication: Discussed with patient Disposition Plan: Admit to med-surg Consults called: Hand surgery Admission status: Inpatient    Briscoe Deutscher, MD [facility] Pager [phone]  If 7PM-7AM, please contact night-coverage [email or web address] Password [facility]  09/09/2016, 6:28 PM

## 2016-09-09 NOTE — Op Note (Signed)
See full dictation [phone]  Status post ORIF left wrist fracture with DVR croissant plate and open carpal tunnel release.  We'll plan for elevation range of motion removal of the drain tomorrow close careful observation. Her median nerve was contused.  We'll monitor the postop course. We'll plan for discharge once medicine clears and RTC in our office in 2 weeks.  Anea Fodera M.D.
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
## 2016-09-09 NOTE — ED Provider Notes (Signed)
[facility]-EMERGENCY DEPT Provider Note   CSN: [id number] Arrival date & time: 09/09/16  1304     History   Chief Complaint Chief Complaint  Patient presents with  . Fall    HPI Anne Black is a 56 y.o. female.  She presents for evaluation of accidental fall, injuring her left wrist and knees.  She denies loss of consciousness, neck pain, back pain, weakness or dizziness.  She presents by EMS.  Treatment in the field included splinting and IV analgesia, fentanyl.  There are no other known modifying factors.   HPI  Past Medical History:  Diagnosis Date  . ADHD (attention deficit hyperactivity disorder)   . Anxiety   . Arthritis   . Ascites   . Bipolar disorder (HCC)   . Cirrhosis (HCC)   . Hypokalemia   . Migraine   . Multiple sclerosis (HCC)   . Osteoporosis     Patient Active Problem List   Diagnosis Date Noted  . Tibia/fibula fracture 09/09/2016  . Umbilical hernia without obstruction and without gangrene 05/12/2016  . Itching 02/24/2016  . Ventral hernia without obstruction or gangrene 02/24/2016  . Palliative care encounter   . Ascites   . Tachypnea   . Typical atrial flutter (HCC)   . Hypomagnesemia   . Hypoxia   . Dehydration 01/22/2016  . Low back ache 12/30/2015  . Non-intractable vomiting with nausea   . Alcohol use disorder (HCC) 12/15/2015  . Acute alcoholism (HCC)   . Abdominal distension 11/04/2015  . Alcohol abuse   . Dyspnea   . Acute respiratory failure (HCC) 10/15/2015  . Ascites due to alcoholic cirrhosis (HCC) 10/15/2015  . Osteoporosis   . Bipolar I disorder (HCC)   . Multiple sclerosis (HCC)   . Alcoholic cirrhosis of liver without ascites (HCC) 08/22/2015  . Bipolar disorder, current episode mixed, moderate (HCC) 08/01/2015  . Alcohol use disorder, severe, dependence (HCC) 07/31/2015  . Macrocytic anemia- due to alcohol abuse with normal B12 & folate levels 05/31/2015  . Severe protein-calorie malnutrition (HCC) 05/31/2015  .  Hypokalemia 05/26/2015  . Encephalopathy, hepatic (HCC) 05/26/2015  . Alcohol withdrawal (HCC) 05/18/2015  . Abdominal pain 05/18/2015  . Anxiety 05/18/2015  . UTI (lower urinary tract infection) 05/18/2015  . Stimulant abuse 12/27/2014  . Nicotine abuse 12/27/2014  . Hyperprolactinemia (HCC) 11/15/2014  . Dyslipidemia   . Tobacco abuse 01/23/2014  . Noncompliance with therapeutic plan 04/04/2013  . Unspecified hereditary and idiopathic peripheral neuropathy 05/01/2012  . GERD (gastroesophageal reflux disease) 05/01/2012  . Osteoarthrosis, unspecified whether generalized or localized, involving lower leg 05/01/2012    Past Surgical History:  Procedure Laterality Date  . CESAREAN SECTION  [id number]  . MYRINGOTOMY WITH TUBE PLACEMENT Bilateral   . TONSILLECTOMY      OB History    No data available       Home Medications    Prior to Admission medications   Medication Sig Start Date End Date Taking? Authorizing Provider  acetaminophen (TYLENOL) 325 MG tablet Take 650 mg by mouth every 6 (six) hours as needed for moderate pain.   Yes [provider]  albuterol (PROVENTIL HFA;VENTOLIN HFA) 108 (90 Base) MCG/ACT inhaler Inhale 2 puffs into the lungs every 6 (six) hours as needed for wheezing or shortness of breath.   Yes [provider]  busPIRone (BUSPAR) 15 MG tablet TAKE 1 TABLET(15 MG) BY MOUTH TWICE DAILY 04/19/16  Yes [name], MD  FLUoxetine (PROZAC) 20 MG capsule Take 20  mg by mouth 2 (two) times daily. 03/30/16  Yes [provider]  Fluticasone-Salmeterol (ADVAIR) 100-50 MCG/DOSE AEPB Inhale 2 Inhalers into the lungs 2 (two) times daily. 11/05/14  Yes [provider]  furosemide (LASIX) 40 MG tablet Take 1 tablet (40 mg total) by mouth daily. 06/17/16  Yes Midge Minium, MD  gabapentin (NEURONTIN) 100 MG capsule Take 2 capsules (200 mg total) by mouth 3 (three) times daily. Patient taking differently: Take 200 mg by mouth 2 (two)  times daily as needed (for ;pan).  01/07/16  Yes [name], MD  lisdexamfetamine (VYVANSE) 70 MG capsule Take 70 mg by mouth daily.   Yes [provider]  Melatonin 3 MG TABS Take 1 tablet (3 mg total) by mouth at bedtime as needed (sleep). 10/24/15  Yes Garth Bigness, MD  modafinil (PROVIGIL) 200 MG tablet Take 200 mg by mouth daily. 08/04/16  Yes [provider]  pantoprazole (PROTONIX) 40 MG tablet Take 1 tablet (40 mg total) by mouth daily. 12/30/15  Yes Haney, Alyssa A, MD  risperiDONE (RISPERDAL) 0.5 MG tablet Take 1 tablet (0.5 mg total) by mouth 3 (three) times daily as needed (agitation). Patient taking differently: Take 0.5 mg by mouth 2 (two) times daily.  02/06/16  Yes Renne Musca, MD  spironolactone (ALDACTONE) 100 MG tablet Take 1 tablet (100 mg total) by mouth daily. 06/17/16  Yes Midge Minium, MD  topiramate (TOPAMAX) 100 MG tablet Take 1 tablet (100 mg total) by mouth daily. 01/07/16  Yes [name], MD  amantadine (SYMMETREL) 100 MG capsule Take 1 capsule (100 mg total) by mouth 2 (two) times daily. Patient not taking: Reported on 08/08/2016 08/04/15   Oneta Rack, NP  cholestyramine light (PREVALITE) 4 g packet Take 1 packet (4 g total) by mouth 3 (three) times daily. With meals Patient not taking: Reported on 08/08/2016 03/11/16   [name], MD  hydrOXYzine (ATARAX/VISTARIL) 50 MG tablet Take 1 tablet (50 mg total) by mouth 3 (three) times daily as needed for itching. Patient not taking: Reported on 09/09/2016 03/11/16   [name], MD  ondansetron (ZOFRAN ODT) 4 MG disintegrating tablet Take 1 tablet (4 mg total) by mouth every 8 (eight) hours as needed for nausea or vomiting. 4mg  ODT q4 hours prn nausea/vomit Patient not taking: Reported on 08/08/2016 03/30/16   Midge Minium, MD  rifaximin (XIFAXAN) 550 MG TABS tablet Take 1 tablet (550 mg total) by mouth 2 (two) times daily. Patient not taking: Reported on 08/08/2016  06/05/15   Catarina Hartshorn, MD    Family History Family History  Problem Relation Age of Onset  . Arrhythmia Mother   . Heart disease Father   . Hypertension Father     Social History Social History  Substance Use Topics  . Smoking status: Former Smoker    Packs/day: 0.75    Years: 20.00    Types: Cigarettes    Quit date: 05/12/2016  . Smokeless tobacco: Never Used     Comment: Currently Vaping Daily  . Alcohol use No     Comment: Heavy ETOH Use in Past- Last Drink 01/23/16     Allergies   Patient has no known allergies.   Review of Systems Review of Systems  All other systems reviewed and are negative.    Physical Exam Updated Vital Signs BP 134/83 (BP Location: Right Arm)   Pulse 88   Temp 97.9 F (36.6 C) (Oral)   Resp 18  SpO2 98%   Physical Exam  Constitutional: She is oriented to person, place, and time. She appears well-developed. No distress (She is uncomfortable).  Appears older than stated age  HENT:  Head: Normocephalic and atraumatic.  Eyes: Pupils are equal, round, and reactive to light. Conjunctivae and EOM are normal.  Neck: Normal range of motion and phonation normal. Neck supple.  Cardiovascular: Normal rate and regular rhythm.   Pulmonary/Chest: Effort normal and breath sounds normal. She exhibits no tenderness.  Abdominal: Soft. She exhibits no distension. There is no tenderness. There is no guarding.  Musculoskeletal:  Left wrist, deformity distal forearm, no skin lesion.  Neurovascular intact distally  Neurological: She is alert and oriented to person, place, and time. She exhibits normal muscle tone.  Skin: Skin is warm and dry.  Psychiatric: She has a normal mood and affect. Her behavior is normal. Judgment and thought content normal.  Nursing note and vitals reviewed.    ED Treatments / Results  Labs (all labs ordered are listed, but only abnormal results are displayed) Labs Reviewed  ETHANOL  COMPREHENSIVE METABOLIC PANEL  CBC  WITH DIFFERENTIAL/PLATELET  AMMONIA    EKG  EKG Interpretation None       Radiology Dg Wrist Complete Left  Result Date: 09/09/2016 CLINICAL DATA:  Status post fall with wrist pain. EXAM: LEFT WRIST - COMPLETE 3+ VIEW COMPARISON:  None. FINDINGS: There is an impacted comminuted intra-articular fracture of the distal left radius. There is a 1/3 shaft posterior dislocation of the distal fracture fragment. There is an accompanied avulsion fracture of the ulnar styloid process. Associated soft tissue swelling. IMPRESSION: Impacted comminuted intra-articular fracture of the distal left radius. Accompanied avulsion fracture of the ulnar styloid process. Electronically Signed   By: Ted Mcalpine M.D.   On: 09/09/2016 14:13    Procedures Procedures (including critical care time)  Medications Ordered in ED Medications  fentaNYL (SUBLIMAZE) injection 100 mcg (100 mcg Intravenous Given 09/09/16 1326)  ondansetron (ZOFRAN) injection 4 mg (4 mg Intravenous Given 09/09/16 1326)  fentaNYL (SUBLIMAZE) injection 100 mcg (100 mcg Intravenous Given 09/09/16 1440)  LORazepam (ATIVAN) injection 1 mg (1 mg Intravenous Given 09/09/16 1442)     Initial Impression / Assessment and Plan / ED Course  I have reviewed the triage vital signs and the nursing notes.  Pertinent labs & imaging results that were available during my care of the patient were reviewed by me and considered in my medical decision making (see chart for details).      Patient Vitals for the past 24 hrs:  BP Temp Temp src Pulse Resp SpO2  09/09/16 1457 - - - - - 98 %  09/09/16 1457 - - - - - (!) 86 %  09/09/16 1316 134/83 97.9 F (36.6 C) Oral 88 18 94 %  09/09/16 1309 - - - - - 96 %    3:52 PM Reevaluation with update and discussion. After initial assessment and treatment, an updated evaluation reveals no change in clinical status.  Patient has been seen by hand surgery who recommends hospitalization for surgical repair of Colles'  fracture.  They would like hospitalist to admit, and hopefully have the patient cleared for surgery, by 6 PM, today. Jazmynn Pho L    Final Clinical Impressions(s) / ED Diagnoses   Final diagnoses:  Fall, initial encounter  Closed Colles' fracture of left radius, initial encounter    Apparent mechanical fall with complex left wrist fracture.  Nursing Notes Reviewed/ Care Coordinated Applicable Imaging  Reviewed Interpretation of Laboratory Data incorporated into ED treatment   Plan- Stay in Hospital under Observation status.  New Prescriptions New Prescriptions   No medications on file     Mancel Bale, MD 09/10/16 [phone]

## 2016-09-09 NOTE — Anesthesia Procedure Notes (Signed)
Procedure Name: Intubation Performed by: Gean Maidens Pre-anesthesia Checklist: Patient identified, Emergency Drugs available, Suction available, Patient being monitored and Timeout performed Patient Re-evaluated:Patient Re-evaluated prior to induction Oxygen Delivery Method: Circle system utilized Preoxygenation: Pre-oxygenation with 100% oxygen Induction Type: IV induction Ventilation: Mask ventilation without difficulty Laryngoscope Size: Mac and 4 Grade View: Grade II Tube type: Oral Tube size: 7.0 mm Number of attempts: 1 Airway Equipment and Method: Stylet Placement Confirmation: ETT inserted through vocal cords under direct vision,  positive ETCO2,  CO2 detector and breath sounds checked- equal and bilateral Secured at: 21 cm Tube secured with: Tape Dental Injury: Teeth and Oropharynx as per pre-operative assessment

## 2016-09-09 NOTE — ED Notes (Signed)
Bed: ER74 Expected date:  Expected time:  Means of arrival:  Comments: EMS 56 yo, fall

## 2016-09-09 NOTE — ED Triage Notes (Signed)
Per EMS, pt fell in the parking lot of [employer]. Pt injured her left wrist and complaining of pain 10/10. Pt is AO x4. Pt received 100 mcg of fentanyl pta. Pt has a hx of COPD. Pt denies taking anticoagulants.

## 2016-09-09 NOTE — Transfer of Care (Signed)
Immediate Anesthesia Transfer of Care Note  Patient: Anne Black  Procedure(s) Performed: Procedure(s): OPEN REDUCTION INTERNAL FIXATION (ORIF) LEFT WRIST FRACTURE, LEFT CARPAL TUNNEL RELEASE (Left)  Patient Location: PACU  Anesthesia Type:General  Level of Consciousness: sedated, patient cooperative and responds to stimulation  Airway & Oxygen Therapy: Patient Spontanous Breathing and Patient connected to face mask oxygen  Post-op Assessment: Report given to RN and Post -op Vital signs reviewed and stable  Post vital signs: Reviewed and stable  Last Vitals:  Vitals:   09/09/16 1809 09/09/16 1917  BP: 100/60 (!) 123/56  Pulse: 73 92  Resp: 12 17  Temp:  36.8 C    Last Pain:  Vitals:   09/09/16 1917  TempSrc: Oral  PainSc:          Complications: No apparent anesthesia complications

## 2016-09-10 ENCOUNTER — Encounter (HOSPITAL_COMMUNITY): Payer: Self-pay | Admitting: Orthopedic Surgery

## 2016-09-10 DIAGNOSIS — K7030 Alcoholic cirrhosis of liver without ascites: Secondary | ICD-10-CM | POA: Diagnosis not present

## 2016-09-10 LAB — CBC
HCT: 34.9 % — ABNORMAL LOW (ref 36.0–46.0)
Hemoglobin: 11.3 g/dL — ABNORMAL LOW (ref 12.0–15.0)
MCH: 29.2 pg (ref 26.0–34.0)
MCHC: 32.4 g/dL (ref 30.0–36.0)
MCV: 90.2 fL (ref 78.0–100.0)
PLATELETS: 138 10*3/uL — AB (ref 150–400)
RBC: 3.87 MIL/uL (ref 3.87–5.11)
RDW: 15.5 % (ref 11.5–15.5)
WBC: 6.3 10*3/uL (ref 4.0–10.5)

## 2016-09-10 LAB — COMPREHENSIVE METABOLIC PANEL
ALBUMIN: 3.5 g/dL (ref 3.5–5.0)
ALT: 23 U/L (ref 14–54)
AST: 39 U/L (ref 15–41)
Alkaline Phosphatase: 116 U/L (ref 38–126)
Anion gap: 8 (ref 5–15)
BUN: 10 mg/dL (ref 6–20)
CHLORIDE: 100 mmol/L — AB (ref 101–111)
CO2: 25 mmol/L (ref 22–32)
CREATININE: 0.48 mg/dL (ref 0.44–1.00)
Calcium: 8.7 mg/dL — ABNORMAL LOW (ref 8.9–10.3)
GFR calc non Af Amer: >60 mL/min (ref >60)
GLUCOSE: 190 mg/dL — AB (ref 65–99)
Potassium: 3.8 mmol/L (ref 3.5–5.1)
SODIUM: 133 mmol/L — AB (ref 135–145)
Total Bilirubin: 1.1 mg/dL (ref 0.3–1.2)
Total Protein: 7.1 g/dL (ref 6.5–8.1)

## 2016-09-10 LAB — URINALYSIS, ROUTINE W REFLEX MICROSCOPIC
Bilirubin Urine: NEGATIVE
GLUCOSE, UA: NEGATIVE mg/dL
HGB URINE DIPSTICK: NEGATIVE
KETONES UR: NEGATIVE mg/dL
NITRITE: NEGATIVE
PH: 7 (ref 5.0–8.0)
PROTEIN: NEGATIVE mg/dL
Specific Gravity, Urine: 1.008 (ref 1.005–1.030)

## 2016-09-10 LAB — MRSA PCR SCREENING: MRSA BY PCR: NEGATIVE

## 2016-09-10 LAB — HIV ANTIBODY (ROUTINE TESTING W REFLEX): HIV SCREEN 4TH GENERATION: NONREACTIVE

## 2016-09-10 MED ORDER — ALBUTEROL SULFATE (2.5 MG/3ML) 0.083% IN NEBU: 2.5000 mg | INHALATION_SOLUTION | RESPIRATORY_TRACT | Status: DC | PRN

## 2016-09-10 MED ORDER — FUROSEMIDE 40 MG PO TABS
40.0000 mg | ORAL_TABLET | Freq: Every day | ORAL | Status: DC
  Administered 2016-09-11: 40 mg via ORAL
  Filled 2016-09-10: qty 1

## 2016-09-10 MED ORDER — IPRATROPIUM-ALBUTEROL 0.5-2.5 (3) MG/3ML IN SOLN: 3.0000 mL | Freq: Three times a day (TID) | RESPIRATORY_TRACT | Status: DC

## 2016-09-10 MED ORDER — IPRATROPIUM-ALBUTEROL 0.5-2.5 (3) MG/3ML IN SOLN
3.0000 mL | Freq: Three times a day (TID) | RESPIRATORY_TRACT | Status: DC
  Administered 2016-09-11: 09:00:00 3 mL via RESPIRATORY_TRACT
  Filled 2016-09-10 (×2): qty 3

## 2016-09-10 MED ORDER — NICOTINE 14 MG/24HR TD PT24
14.0000 mg | MEDICATED_PATCH | Freq: Every day | TRANSDERMAL | Status: DC
  Administered 2016-09-10 – 2016-09-11 (×2): 14 mg via TRANSDERMAL
  Filled 2016-09-10 (×2): qty 1

## 2016-09-10 NOTE — Progress Notes (Signed)
Subjective: 1 Day Post-Op Procedure(s) (LRB): OPEN REDUCTION INTERNAL FIXATION (ORIF) LEFT WRIST FRACTURE, LEFT CARPAL TUNNEL RELEASE (Left) Patient reports pain as controlled at  this juncture. She is finishing dinner. Her daughters are in the room. She denies nausea, vomiting, fever or chills. She is noted to be very tangential in her thought process and requires redirection multiple times. We have discussed with her potential discharge issues. Currently, physical therapy evaluation needs to be performed. We have discussed with her the potential for discharge pending therapy, hospitalist evaluation and care management/disposition issues. She is tolerating by mouth's and voiding without difficulties.  Objective: Vital signs in last 24 hours: Temp:  [97.6 F (36.4 C)-98.7 F (37.1 C)] 97.7 F (36.5 C) (08/03 1410) Pulse Rate:  [73-92] 89 (08/03 1410) Resp:  [12-20] 16 (08/03 1410) BP: (100-147)/(56-77) 116/56 (08/03 1410) SpO2:  [95 %-100 %] 96 % (08/03 1410) Weight:  [63.5 kg (140 lb)-63.6 kg (140 lb 3.4 oz)] 63.6 kg (140 lb 3.4 oz) (08/03 0501)  Intake/Output from previous day: 08/02 0701 - 08/03 0700 In: 2480 [P.O.:1080; I.V.:1400] Out: 870 [Urine:850; Drains:10; Blood:10] Intake/Output this shift: Total I/O In: 480 [P.O.:480] Out: 500 [Urine:500]   Recent Labs  09/09/16 1720 09/10/16 0532  HGB 12.5 11.3*    Recent Labs  09/09/16 1720 09/10/16 0532  WBC 8.3 6.3  RBC 4.19 3.87  HCT 37.1 34.9*  PLT 136* 138*    Recent Labs  09/09/16 1720 09/10/16 0532  NA 131* 133*  K 3.4* 3.8  CL 95* 100*  CO2 25 25  BUN 11 10  CREATININE 0.40* 0.48  GLUCOSE 112* 190*  CALCIUM 9.1 8.7*    Recent Labs  09/09/16 1720  INR 1.11    The patient complains of pain in the affected upper extremity.  The patient is noted to have a normal HEENT exam. Lung fields show equal chest expansion, audible wheezing which clears with inspiration and coug. Lower extremity examination  does not show any fracture dislocation or blood clot symptoms. Left upper extremity: Splint is clean dry and intact she has excellent digital range of motion her sensation and refill is intact. She still has a slight increased amount numbness along the median nerve distribution not be unexpected given the contusive injury she sustained as noted intraoperatively. The drain is pulled without difficulties.  Assessment/Plan: 1 Day Post-Op Procedure(s) (LRB): OPEN REDUCTION INTERNAL FIXATION (ORIF) LEFT WRIST FRACTURE, LEFT CARPAL TUNNEL RELEASE (Left) Patient Active Problem List   Diagnosis Date Noted  . Distal radius fracture, left 09/09/2016  . Thrombocytopenia (HCC) 09/09/2016  . Chest x-ray abnormality   . Umbilical hernia without obstruction and without gangrene 05/12/2016  . Itching 02/24/2016  . Ventral hernia without obstruction or gangrene 02/24/2016  . Palliative care encounter   . Ascites   . Tachypnea   . Typical atrial flutter (HCC)   . Hypomagnesemia   . Hypoxia   . Dehydration 01/22/2016  . Low back ache 12/30/2015  . Non-intractable vomiting with nausea   . Alcohol use disorder (HCC) 12/15/2015  . Acute alcoholism (HCC)   . Abdominal distension 11/04/2015  . Alcohol abuse   . Dyspnea   . Acute respiratory failure (HCC) 10/15/2015  . Ascites due to alcoholic cirrhosis (HCC) 10/15/2015  . Osteoporosis   . Bipolar I disorder (HCC)   . Multiple sclerosis (HCC)   . Alcoholic cirrhosis of liver without ascites (HCC) 08/22/2015  . Bipolar disorder, current episode mixed, moderate (HCC) 08/01/2015  . Alcohol use disorder,  severe, dependence (HCC) 07/31/2015  . Macrocytic anemia- due to alcohol abuse with normal B12 & folate levels 05/31/2015  . Severe protein-calorie malnutrition (HCC) 05/31/2015  . Hypokalemia 05/26/2015  . Encephalopathy, hepatic (HCC) 05/26/2015  . Alcohol withdrawal (HCC) 05/18/2015  . Abdominal pain 05/18/2015  . Anxiety 05/18/2015  . UTI (lower  urinary tract infection) 05/18/2015  . Stimulant abuse 12/27/2014  . Nicotine abuse 12/27/2014  . Hyperprolactinemia (HCC) 11/15/2014  . Dyslipidemia   . Tobacco abuse 01/23/2014  . Noncompliance with therapeutic plan 04/04/2013  . Unspecified hereditary and idiopathic peripheral neuropathy 05/01/2012  . GERD (gastroesophageal reflux disease) 05/01/2012  . Osteoarthrosis, unspecified whether generalized or localized, involving lower leg 05/01/2012  . Hyponatremia 07/01/2011   I have spent greater than 45 minutes discussing all issues with the patient. At this juncture we will wait for physical therapy and medicines evaluation. Case care management consultation and recommendations are currently pending. She will be stable from an upper extremity standpoint once these issues are addressed. She will be nonweightbearing to the wrist no lifting gripping twisting pressure pulling with the hand. She will need to elevate the upper extremity frequently and work on digital range of motion. I have discussed with her at length the merits of tobacco cessation and the detriment it has on bone healing. Once final disposition is obtained we will need to see her in approximate 2 weeks for a splint removal, suture removals and repeat radiographs and cast application. All questions were encouraged and answered.  Anne Black L 09/10/2016, 3:13 PM

## 2016-09-10 NOTE — Evaluation (Signed)
Physical Therapy Evaluation Patient Details Name: Anne Black MRN: [id number] DOB: [birth year] Today's Date: 09/10/2016   History of Present Illness  Pt fell in the community and sustained a L comminuted wrist fx.  She is s/p ORIF and CTR.  PMH:  ETOH, bipolar, MS, and ADHD  Clinical Impression  Pt admitted as above and presenting with functional mobility limitations 2* ambulatory balance deficits and poor safety awareness.      Follow Up Recommendations Supervision/Assistance - 24 hour;Home health PT    Equipment Recommendations  None recommended by PT    Recommendations for Other Services OT consult     Precautions / Restrictions Precautions Precautions: Fall Precaution Comments: no lifting pulling twisting and gripping with LUE Restrictions Weight Bearing Restrictions: Yes LUE Weight Bearing: Non weight bearing      Mobility  Bed Mobility Overal bed mobility: Needs Assistance Bed Mobility: Supine to Sit     Supine to sit: Supervision     General bed mobility comments: HOB raised; cues for NWB  Transfers Overall transfer level: Needs assistance Equipment used: None Transfers: Sit to/from Stand Sit to Stand: Min guard Stand pivot transfers: Min guard       General transfer comment: for safety.  Pt moves quickly  Ambulation/Gait Ambulation/Gait assistance: Min assist Ambulation Distance (Feet): 250 Feet Assistive device: None Gait Pattern/deviations: Step-through pattern;Decreased step length - right;Decreased step length - left;Shuffle;Trunk flexed Gait velocity: decr Gait velocity interpretation: Below normal speed for age/gender General Gait Details: Pt ambulating with mild instability but no LOB until losing focus on task. - 2 episodes balance loss requiring min assist to correct  Stairs            Wheelchair Mobility    Modified Rankin (Stroke Patients Only)       Balance Overall balance assessment: Needs assistance Sitting-balance  support: No upper extremity supported;Feet supported Sitting balance-Leahy Scale: Good     Standing balance support: No upper extremity supported Standing balance-Leahy Scale: Fair                               Pertinent Vitals/Pain Pain Assessment: No/denies pain    Home Living Family/patient expects to be discharged to:: Private residence Living Arrangements: Alone Available Help at Discharge: Family;Available PRN/intermittently Type of Home: House Home Access: Stairs to enter Entrance Stairs-Rails: None Entrance Stairs-Number of Steps: 1 Home Layout: Able to live on main level with bedroom/bathroom;Multi-level Home Equipment: Walker - 2 wheels;Cane - single point;Wheelchair - manual      Prior Function Level of Independence: Independent         Comments: pt reports she was not using AD     Hand Dominance   Dominant Hand: Right    Extremity/Trunk Assessment   Upper Extremity Assessment Upper Extremity Assessment: Defer to OT evaluation LUE Deficits / Details: immobilized wrist.  Pt able to move fingers--reports numbness.  Had variable numbness before sx due to MS    Lower Extremity Assessment Lower Extremity Assessment: Overall WFL for tasks assessed       Communication   Communication: No difficulties  Cognition Arousal/Alertness: Awake/alert Behavior During Therapy: Impulsive;Anxious Overall Cognitive Status: No family/caregiver present to determine baseline cognitive functioning                                 General Comments: pt with h/o ADHD.  She lost  train of what we were going to do during session.  Verbalizes NWB of LUE, but cues needed to follow. Pt moves quickly. 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       General Comments      Exercises     Assessment/Plan    PT Assessment Patient needs continued PT services  PT Problem List Decreased activity tolerance;Decreased balance;Decreased  mobility;Decreased knowledge of use of DME;Obesity       PT Treatment Interventions DME instruction;Gait training;Stair training;Functional mobility training;Therapeutic activities;Therapeutic exercise;Balance training;Patient/family education    PT Goals (Current goals can be found in the Care Plan section)  Acute Rehab PT Goals Patient Stated Goal: Mow my lawn PT Goal Formulation: With patient Time For Goal Achievement: 09/18/16 Potential to Achieve Goals: Fair    Frequency Min 3X/week   Barriers to discharge Decreased caregiver support Very Intermittent assist of family only    Co-evaluation               AM-PAC PT "6 Clicks" Daily Activity  Outcome Measure Difficulty turning over in bed (including adjusting bedclothes, sheets and blankets)?: Total Difficulty moving from lying on back to sitting on the side of the bed? : Total Difficulty sitting down on and standing up from a chair with arms (e.g., wheelchair, bedside commode, etc,.)?: A Little Help needed moving to and from a bed to chair (including a wheelchair)?: A Little Help needed walking in hospital room?: A Little Help needed climbing 3-5 steps with a railing? : A Little 6 Click Score: 14    End of Session Equipment Utilized During Treatment: Gait belt Activity Tolerance: Patient tolerated treatment well Patient left: in chair;with call bell/phone within reach;with chair alarm set Nurse Communication: Mobility status PT Visit Diagnosis: Unsteadiness on feet (R26.81)    Time: 4098-1191 PT Time Calculation (min) (ACUTE ONLY): 20 min   Charges:   PT Evaluation $PT Eval Low Complexity: 1 Low     PT G Codes:        Pg [phone]   Ulysse Siemen 09/10/2016, 5:31 PM
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
## 2016-09-10 NOTE — Evaluation (Addendum)
Occupational Therapy Evaluation Patient Details Name: Anne Black MRN: [id number] DOB: 1960-11-21 Today's Date: 09/10/2016    History of Present Illness Pt fell in the community and sustained a L comminuted wrist fx.  She is s/p ORIF and CTR.  PMH:  ETOH, bipolar, MS, and ADHD   Clinical Impression   This 56 year old female was admitted for the above.  Initial evaluation completed; however, pt limited time as she wanted to see her show (stocks). Will return later today to continue education on edema control and NWB and help her problem solve how to complete ADLs more independently.    Follow Up Recommendations  Supervision/Assistance - and HHOT.  Pt does not have 24/7. She is interested in SNF, if she qualifies   Equipment Recommendations    likely none   Recommendations for Other Services       Precautions / Restrictions Precautions Precautions: Fall Precaution Comments: no lifting pulling twisting and gripping with LUE Restrictions Weight Bearing Restrictions: Yes Other Position/Activity Restrictions: nwb  No lifting, pulling, twisting, gripping with LUE    Mobility Bed Mobility Overal bed mobility: Needs Assistance Bed Mobility: Supine to Sit     Supine to sit: Supervision     General bed mobility comments: HOB raised; cues for NWB  Transfers Overall transfer level: Needs assistance Equipment used: None Transfers: Sit to/from [employer] Sit to Stand: Min guard Stand pivot transfers: Min guard       General transfer comment: for safety.  Pt moves quickly    Balance                                           ADL either performed or assessed with clinical judgement   ADL Overall ADL's : Needs assistance/impaired Eating/Feeding: Sitting;Set up (assistance to cut food)   Grooming: Set up;Sitting, cues for LUE   Upper Body Bathing: Sitting;Minimal assistance   Lower Body Bathing: Minimal assistance;Sit to/from stand    Upper Body Dressing : Minimal assistance   Lower Body Dressing: Moderate assistance;Sit to/from stand   Toilet Transfer: Min guard;Stand-pivot (to chair)   Toileting- Architect and Hygiene: Minimal assistance;Sit to/from stand         General ADL Comments: pt needs assistance with adls. Needs frequent cues for LUE, especially NWB      Vision         Perception     Praxis      Pertinent Vitals/Pain Pain Assessment: No/denies pain     Hand Dominance     Extremity/Trunk Assessment Upper Extremity Assessment Upper Extremity Assessment: LUE deficits/detail LUE Deficits / Details: immobilized wrist.  Pt able to move fingers--reports numbness.  Had variable numbness before sx due to MS           Communication Communication Communication: No difficulties   Cognition Arousal/Alertness: Awake/alert   Overall Cognitive Status: No family/caregiver present to determine baseline cognitive functioning                                 General Comments: pt with h/o ADHD.  She lost train of what we were going to do during session.  Verbalizes NWB of LUE, but cues needed to follow. Pt moves quickly. 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    General Comments  educated on edema control:  positioning, AROM as well as NWB.  Positioned pt prior to leaving. Will need reinforcement    Exercises     Shoulder Instructions      Home Living Family/patient expects to be discharged to:: Private residence Living Arrangements: Alone Available Help at Discharge: Family;Available PRN/intermittently Type of Home: House                       Home Equipment: Walker - 2 wheels;Cane - single point;Wheelchair - manual          Prior Functioning/Environment Level of Independence: Independent        Comments: pt reports she was not using AD        OT Problem List: Decreased strength;Decreased activity tolerance;Decreased  cognition;Decreased knowledge of use of DME or AE      OT Treatment/Interventions: Self-care/ADL training;DME and/or AE instruction;Patient/family education;Therapeutic activities    OT Goals(Current goals can be found in the care plan section) Acute Rehab OT Goals Patient Stated Goal: none stated OT Goal Formulation: With patient (agreeable to OT returning later) Time For Goal Achievement: 09/11/16 Potential to Achieve Goals: Good ADL Goals Pt Will Perform Lower Body Dressing: with supervision;sit to/from stand Pt Will Transfer to Toilet: with supervision;ambulating;regular height toilet;bedside commode (vs) Pt Will Perform Toileting - Clothing Manipulation and hygiene: with supervision;sit to/from stand Additional ADL Goal #1: pt will be independent with edema management and NWB  OT Frequency: Min 2X/week   Barriers to D/C:            Co-evaluation              AM-PAC PT "6 Clicks" Daily Activity     Outcome Measure Help from another person eating meals?: None Help from another person taking care of personal grooming?: A Little Help from another person toileting, which includes using toliet, bedpan, or urinal?: A Little Help from another person bathing (including washing, rinsing, drying)?: A Little Help from another person to put on and taking off regular upper body clothing?: A Little Help from another person to put on and taking off regular lower body clothing?: A Lot 6 Click Score: 18   End of Session    Activity Tolerance:  (pt self limited session due to TV show) Patient left: in chair;with call bell/phone within reach;with chair alarm set  OT Visit Diagnosis: Muscle weakness (generalized) (M62.81);History of falling (Z91.81)                Time: 1150-1206 OT Time Calculation (min): 16 min Charges:  OT General Charges $OT Visit: 1 Procedure OT Evaluation $OT Eval Low Complexity: 1 Procedure G-Codes:     [place],  OTR/L [phone] 09/10/2016  Aniel Hubble 09/10/2016, 3:35 PM
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
## 2016-09-10 NOTE — Progress Notes (Addendum)
PROGRESS NOTE    Anne Black  ZOX:[id number] DOB: 1960/03/17 DOA: 09/09/2016 PCP: Anne Glimpse, PA-C  Brief Narrative:  HPI: Anne Black is a 56 y.o. female with medical history significant for alcohol dependence, alcoholic cirrhosis, bipolar disorder, and multiple sclerosis, now presenting to the emergency department with severe left forearm pain after mechanical fall. Patient reports that she had been in her usual state of health and was in a parking lot when she tripped, falling onto an outstretched left arm. She experienced immediate and severe pain at this site and EMS was called out. She denies any recent fevers or chills, denies any chest pain or palpitations, and denies any increased cough or dyspnea. No abdominal pain, dysuria, or flank pain. She denies hitting her head and denies headaches, change in vision or hearing, focal numbness or weakness.  ED- Radiographs of the left forearm demonstrate impacted and comminuted intra-articular fracture of the distal left radius as well as avulsion fracture of the ulnar styloid process.  Assessment & Plan:   Principal Problem:   Distal radius fracture, left Active Problems:   Hyponatremia   Tobacco abuse   Hypokalemia   Alcohol use disorder, severe, dependence (HCC)   Alcoholic cirrhosis of liver without ascites (HCC)   Bipolar I disorder (HCC)   Multiple sclerosis (HCC)   Thrombocytopenia (HCC)   Distal left radius fracture- Found to have impacted and comminuted intraarticular fracture of distal radius and avulsion fracture of ulnar styloid process  - Hand surgery recs appreciated-  s/p ORIF with DVR croissant plate and open carpal tunnel release. We'll plan for elevation range of motion removal of the drain tomorrow close careful observation. Her median nerve was contused.We'll monitor the postop course. We'll plan for discharge once medicine clears and RTC in our office in 2 weeks.  Addendum- called to patient bedside,  patient requesting to leave the hospital AMA. The patient's nurse reports patient is not steady on her feet. Patient is awake alert and oriented 3. She has capacity and is able to tell me why she is here, and the procedure she had been But when asked to explain to me why I don't feel comfortable discharging her- she tells me "for extra money". She reports she can take care of herself. PT recommending 24-hour supervision which she does not have, HHPT. Told patient she will have to sign out AMA as I cannot discharge her. She says she now does not have transportation and so she cannot go home today. Family was present earlier today but have gone now and she has no contact number for her mother's daughters.  COPD - Mild diffuse wheezing, no obvious SOB.  - Duonebs Scheduled for now - Continue scheduled ICS/LABA BID,  - Nicotine patch  Abnormal UA- with LArge leuks, Rare bacteria. Pt initially tells me that she had burning with urination last week, she was prescribed an antibiotic which she took- probably has a couple of tabs left, cannot remember name or duration. She later reports that dysuria is present but intermittent, she has frequency, but is on diuretics. - Will add urine culture to original UA sample - Will hold off on antibiotics of now.  Alcohol dependence  - Last drink was 1-2 days before admission.  - Sleeping this am, Monitor with CIWA and prn Ativan - Supplement vitamins and minerals   Alcoholic cirrhosis  - Appears well-compensated  - Can resume LAsix 40 daily in a.m  - Continue daily PPI    Electrolytes  abnormality- Low Na 131 >>133, Low K. 3.4 >>3.8. Low Na likely related to Liver cirrhosis. - Monitor  Bipolar disorder - Stable - Continue Risperdal, Topamax, Buspar   Multiple sclerosis  - Follows [facility]y at [facility] - Has been stable and managed with supportive care only    DVT prophylaxis: SCD's   Code Status: Full  Family Communication: Discussed with  patient Disposition Plan: Admit to med-surg Consults called: Hand surgery Admission status: Inpatient   Procedures: ORIF  Antimicrobials:   None, except peri-op antibiotics.  Subjective: Pt sleeping this am, but easily arousable. She denies chest pain, or SOB. Pain in Left arm is better.   Objective: Vitals:   09/10/16 0044 09/10/16 0149 09/10/16 0501 09/10/16 1000  BP: 116/75 120/72 108/67 (!) 108/57  Pulse: 80 78 84 81  Resp: 15 15 14 14   Temp: 97.9 F (36.6 C) 98 F (36.7 C) 98 F (36.7 C) 97.8 F (36.6 C)  TempSrc: Oral Oral Oral Oral  SpO2: 98% 97% 98% 95%  Weight:   63.6 kg (140 lb 3.4 oz)   Height:        Intake/Output Summary (Last 24 hours) at 09/10/16 1216 Last data filed at 09/10/16 1112  Gross per 24 hour  Intake             2720 ml  Output             1170 ml  Net             1550 ml   Filed Weights   09/09/16 2300 09/10/16 0501  Weight: 63.5 kg (140 lb) 63.6 kg (140 lb 3.4 oz)    Examination:  General exam: Appears calm and comfortable  Respiratory system: Mild diffuse expiratory wheeze. Cardiovascular system: S1 & S2 heard, RRR. No JVD, murmurs, rubs, gallops or clicks. No pedal edema. Gastrointestinal system: Abdomen is nondistended, soft and nontender. No organomegaly or masses felt. Normal bowel sounds heard. Central nervous system: Alert and oriented. No focal neurological deficits. Extremities: Symmetric 5 x 5 power. Skin: No rashes, lesions or ulcers Psychiatry: Judgement and insight appear normal. Mood & affect appropriate.   Data Reviewed: I have personally reviewed following labs and imaging studies  CBC:  Recent Labs Lab 09/09/16 1720 09/10/16 0532  WBC 8.3 6.3  NEUTROABS 7.1  --   HGB 12.5 11.3*  HCT 37.1 34.9*  MCV 88.5 90.2  PLT 136* 138*   Basic Metabolic Panel:  Recent Labs Lab 09/09/16 1720 09/10/16 0532  NA 131* 133*  K 3.4* 3.8  CL 95* 100*  CO2 25 25  GLUCOSE 112* 190*  BUN 11 10  CREATININE 0.40* 0.48   CALCIUM 9.1 8.7*   Liver Function Tests:  Recent Labs Lab 09/09/16 1720 09/10/16 0532  AST 39 39  ALT 28 23  ALKPHOS 133* 116  BILITOT 1.8* 1.1  PROT 7.9 7.1  ALBUMIN 4.2 3.5   No results for input(s): LIPASE, AMYLASE in the last 168 hours.  Recent Labs Lab 09/09/16 1720  AMMONIA 41*   Coagulation Profile:  Recent Labs Lab 09/09/16 1720  INR 1.11     Recent Results (from the past 240 hour(s))  MRSA PCR Screening     Status: None   Collection Time: 09/09/16 11:57 PM  Result Value Ref Range Status   MRSA by PCR NEGATIVE NEGATIVE Final    Comment:        The GeneXpert MRSA Assay (FDA approved for NASAL specimens only), is one  component of a comprehensive MRSA colonization surveillance program. It is not intended to diagnose MRSA infection nor to guide or monitor treatment for MRSA infections.      Radiology Studies: Dg Wrist Complete Left  Result Date: 09/09/2016 CLINICAL DATA:  Status post fall with wrist pain. EXAM: LEFT WRIST - COMPLETE 3+ VIEW COMPARISON:  None. FINDINGS: There is an impacted comminuted intra-articular fracture of the distal left radius. There is a 1/3 shaft posterior dislocation of the distal fracture fragment. There is an accompanied avulsion fracture of the ulnar styloid process. Associated soft tissue swelling. IMPRESSION: Impacted comminuted intra-articular fracture of the distal left radius. Accompanied avulsion fracture of the ulnar styloid process. Electronically Signed   By: Ted Mcalpine M.D.   On: 09/09/2016 14:13   Dg Chest 2v Repeat Same Day  Result Date: 09/09/2016 CLINICAL DATA:  Abnormality seen at right lung base on prior chest x-ray. EXAM: CHEST  2 VIEW COMPARISON:  Earlier the same day FINDINGS: Patient was unable to stand for true PA frontal positioning. As such, an AP exam was again obtained. The focal opacity seen at the right lung base on the prior study is not evident on this film. Left lung is clear. The  cardiopericardial silhouette is within normal limits for size. The visualized bony structures of the thorax are intact. Telemetry leads overlie the chest. IMPRESSION: Limited study due to the patient's inability to stand for PA imaging. Abnormality at the right base on the prior study not readily evident on this film. Finding unlikely to be an acute abnormality and repeat nonemergent two-view chest x-ray, after resolution of acute symptoms, could be used to further evaluate. Electronically Signed   By: Kennith Center M.D.   On: 09/09/2016 18:35   Dg Chest Port 1 View  Result Date: 09/09/2016 CLINICAL DATA:  Hypoxia.  Long-term smoker.  History of COPD. EXAM: PORTABLE CHEST 1 VIEW COMPARISON:  Chest x-ray of January 28, 2016 FINDINGS: The lungs are well-expanded. There is subtle ovoid increased density in the right infrahilar region. The interstitial markings are coarse. There is no pleural effusion. The heart is normal in size. The pulmonary vascularity is not engorged. There is calcification in the wall of the aortic arch. The trachea is midline. The bony thorax exhibits no acute abnormality. IMPRESSION: COPD. Subtle increased density just above the right hemidiaphragm merits further evaluation either with a PA and lateral chest x-ray or chest CT scan to exclude an abnormal nodule. There is no CHF. Thoracic aortic atherosclerosis. Electronically Signed   By: David  [place] M.D.   On: 09/09/2016 16:29   Scheduled Meds: . busPIRone  15 mg Oral BID  . docusate sodium  100 mg Oral BID  . famotidine  20 mg Oral Daily  . FLUoxetine  20 mg Oral BID  . folic acid  1 mg Oral Daily  . LORazepam  0-4 mg Intravenous Q6H   Followed by  . [START ON 09/11/2016] LORazepam  0-4 mg Intravenous Q12H  . mometasone-formoterol  2 puff Inhalation BID  . multivitamin with minerals  1 tablet Oral Daily  . pantoprazole  40 mg Oral Daily  . risperiDONE  0.5 mg Oral BID  . thiamine  100 mg Oral Daily   Or  . thiamine  100 mg  Intravenous Daily  . topiramate  100 mg Oral Daily  . vitamin C  1,000 mg Oral Daily   Continuous Infusions: .  ceFAZolin (ANCEF) IV Stopped (09/10/16 0524)     LOS:  1 day   Onnie Boer, MD [facility] Pager [phone]  If 7PM-7AM, please contact night-coverage [email or web address] Password [email or web address] 09/10/2016, 12:16 PM

## 2016-09-10 NOTE — Op Note (Signed)
NAMEMINERVIA, BRILLA NO.:  [id number]  MEDICAL RECORD NO.:  [id number]  LOCATION:  [facility]                         FACILITY:  [facility]  PHYSICIAN:  Dionne Ano. Lowana Hable, M.D.DATE OF BIRTH:  10/17/1960  DATE OF PROCEDURE: DATE OF DISCHARGE:                              OPERATIVE REPORT   PREOPERATIVE DIAGNOSIS:  Comminuted complex greater than 5-part intra- articular distal radius fracture with associated carpal tunnel syndrome, left upper extremity.  POSTOPERATIVE DIAGNOSIS:  Comminuted complex greater than 5-part intra- articular distal radius fracture with associated carpal tunnel syndrome, left upper extremity.  PROCEDURES: 1. Open reduction and internal fixation with DVR Crosslock plate, left     comminuted complex greater than 5-part intraarticular distal radius     fracture utilizing stay graft, bone graft. 2. Open left carpal tunnel release. 3. Four-view x-ray series, left wrist, performed, examined, and     interpreted by myself.  SURGEON:  Dionne Ano. Amanda Pea, M.D.  ASSISTANT:  Karie Chimera, P.A.-C.  COMPLICATIONS:  None.  ANESTHESIA:  General.  TOURNIQUET TIME:  Less than an hour.  INDICATIONS:  A 56 year old female with multiple medical problems and challenges, presents after mechanical fall with fracture.  She has been cleared by Medicine.  I have counseled her in regard to risks and benefits of surgery, and she desires to proceed.  OPERATIVE PROCEDURE IN DETAIL:  The patient was seen by myself and Anesthesia, taken to the operative theater, underwent smooth induction of general anesthesia.  She was laid supine and appropriately padded. Hibiclens pre-scrub followed by 10 minutes of surgical Betadine scrub was employed.  The patient underwent a very careful and cautious approach to the extremity at this time with sterile field being secured. Preoperative antibiotics were given and the patient then underwent a tourniquet insufflation after the  time-out.  Incision was made volar radial in nature.  FCR sheath was incised palmarly and dorsally, retracted hand.  Following this, pronator was incised.  The fracture was accessed.  A large void was noted.  This was packed with stay graft.  I then reduced the fracture.  This was greater than 5-part intraarticular fracture.  Following this, I applied a DVR Crosslock plate, standard technique was used.  Adequate height, volar tilt, and radial inclination were restored.  I was pleased with this and the findings.  AP, lateral, and oblique x-rays were performed, examined and interpreted by myself, and looked to be excellent.  I was quite pleased with this.  Following this, I performed incision and an open carpal tunnel release was performed.  Palmar fascia was incised.  Distal edge of transcarpal ligament was identified.  Superficial palmar arch carefully protected. Fat pad egressed nicely.  There were no complicating features.  Distal to proximal dissection was carried out until adequate room was available to slide the blunt tip parotidectomy scissors.  Following this, the patient then underwent evaluation of the canal.  The median nerve was bruised with some hematoma around it from the fall. Hopefully, this will perk up quickly.  The area was irrigated.  Pronator closed with Vicryl.  Final copy x-rays taken and the patient then had closure with Prolene.  This was closed over  TLS size 7 drain over the main volar radial incision.  Sterile dressing was applied.  There were no complicating features.  All sponge and instrument counts were reported as correct.  She has multiple medical challenges.  She will be admitted to the Medicine Service and notify should any problems occur.  We will stand. We will proceed according to standard DVR protocol postoperatively. These notes have been discussed and all questions have been encouraged and answered.  It is pleasure participating in her care and  we will plan for suture removal at 2 weeks.  At 4 weeks, removal of splint if things were looking appropriately.  We will wait 8 weeks for any aggressive strengthening, etc.  These notes have been discussed.  All questions have been encouraged and answered.     Dionne Ano. Amanda Pea, M.D.     [facility]  D:  09/09/2016  T:  09/10/2016  Job:  [id number]

## 2016-09-10 NOTE — Progress Notes (Signed)
OT Cancellation Note  Patient Details Name: Anne Black MRN: [id number] DOB: [birth year]   Cancelled Treatment:     Stopped in 2 more times.  Pt was eating when I first arrived and mother came in.  Mother said that pt will not have 24/7, and she does need this. They are interested in SNF, but likely won't qualify.  Pt is now back to bed and sleepy.  If pt is here tomorrow, we will stop back.  Prachi Oftedahl 09/10/2016, 1:56 PM  Marica Otter, OTR/L [phone] 09/10/2016

## 2016-09-11 DIAGNOSIS — R0902 Hypoxemia: Secondary | ICD-10-CM | POA: Diagnosis not present

## 2016-09-11 DIAGNOSIS — E876 Hypokalemia: Secondary | ICD-10-CM

## 2016-09-11 DIAGNOSIS — S52532A Colles' fracture of left radius, initial encounter for closed fracture: Secondary | ICD-10-CM | POA: Diagnosis not present

## 2016-09-11 DIAGNOSIS — D696 Thrombocytopenia, unspecified: Secondary | ICD-10-CM

## 2016-09-11 DIAGNOSIS — K7030 Alcoholic cirrhosis of liver without ascites: Secondary | ICD-10-CM | POA: Diagnosis not present

## 2016-09-11 DIAGNOSIS — G35 Multiple sclerosis: Secondary | ICD-10-CM

## 2016-09-11 DIAGNOSIS — E871 Hypo-osmolality and hyponatremia: Secondary | ICD-10-CM

## 2016-09-11 DIAGNOSIS — S52502D Unspecified fracture of the lower end of left radius, subsequent encounter for closed fracture with routine healing: Secondary | ICD-10-CM | POA: Diagnosis not present

## 2016-09-11 DIAGNOSIS — F319 Bipolar disorder, unspecified: Secondary | ICD-10-CM

## 2016-09-11 DIAGNOSIS — Z720 Tobacco use: Secondary | ICD-10-CM

## 2016-09-11 MED ORDER — LISDEXAMFETAMINE DIMESYLATE 30 MG PO CAPS
30.0000 mg | ORAL_CAPSULE | Freq: Every day | ORAL | Status: DC
  Administered 2016-09-11: 30 mg via ORAL
  Filled 2016-09-11: qty 1

## 2016-09-11 MED ORDER — LISDEXAMFETAMINE DIMESYLATE 70 MG PO CAPS: 70.0000 mg | ORAL_CAPSULE | Freq: Every day | ORAL | Status: DC

## 2016-09-11 MED ORDER — IPRATROPIUM-ALBUTEROL 0.5-2.5 (3) MG/3ML IN SOLN: 3.0000 mL | Freq: Two times a day (BID) | RESPIRATORY_TRACT | Status: DC

## 2016-09-11 MED ORDER — LISDEXAMFETAMINE DIMESYLATE 20 MG PO CAPS
40.0000 mg | ORAL_CAPSULE | Freq: Every day | ORAL | Status: DC
  Administered 2016-09-11: 16:00:00 40 mg via ORAL
  Filled 2016-09-11: qty 2

## 2016-09-11 MED ORDER — OXYCODONE HCL 10 MG PO TABS: 10.0000 mg | ORAL_TABLET | ORAL | 0 refills | Status: DC | PRN

## 2016-09-11 NOTE — Progress Notes (Addendum)
Occupational Therapy Treatment Patient Details Name: Anne Black MRN: [id number] DOB: 1960-12-20 Today's Date: 09/11/2016    History of present illness Pt fell in the community and sustained a L comminuted wrist fx.  She is s/p ORIF and CTR.  PMH:  ETOH, bipolar, MS, and ADHD   OT comments  Pt is impulsive at times during session and needs frequent reminders to not use L UE in activity including gripping and pulling on items. Educated on edema control and digit ROM this session and pt performed X 10 digit flexion/extension L hand. Pt had much difficulty with donning sweat pants this session and required mod assist and mod cues. Pt will require 24/7 assistance at d/c.   Follow Up Recommendations  Supervision/Assistance - 24 hour;Home health OT    Equipment Recommendations       Recommendations for Other Services      Precautions / Restrictions Precautions Precautions: Fall Precaution Comments: no lifting pulling twisting and gripping with LUE Restrictions LUE Weight Bearing: Non weight bearing       Mobility Bed Mobility               General bed mobility comments: in chair.  Transfers Overall transfer level: Needs assistance Equipment used: None Transfers: Sit to/from Stand Sit to Stand: Min assist         General transfer comment: difficulty with sit to stand out of recliner. Needed min assist to boost up due to report of L LE cramping during ADL.    Balance                                           ADL either performed or assessed with clinical judgement   ADL                       Lower Body Dressing: Moderate assistance;Sit to/from stand                 General ADL Comments: Worked on education for LB dressing. Pt stating her L LE started to cramp when she attempted to raise L LE to slide on pants. Pt overall required mod assist to don sweatpants as she needed assistance for pulling pant legs fully over feet, some  assistance for sit to stand due to L LE cramping and also mod cues for technique. She needed cues to not pull on pants with L hand. She seemed to get easily frustrated and wanted to stop but with encouragement she was able to continue with task. When asked how she needs to position L UE, she stated, "you are going to have to give me a bit. its a lot of information." Reinforced keeping L UE elevated for edema control.      Vision Patient Visual Report: No change from baseline     Perception     Praxis      Cognition Arousal/Alertness: Awake/alert Behavior During Therapy: Impulsive;Anxious Overall Cognitive Status: No family/caregiver present to determine baseline cognitive functioning                                 General Comments: She verbalizes understanding of NWB L UE and not to grip and pull with L hand, but needs continous cues to follow. She is impulsive during ADL.  Exercises Other Exercises Other Exercises: digit ROM L hand X 10   Shoulder Instructions       General Comments      Pertinent Vitals/ Pain       Pain Assessment: 0-10 Pain Score: 8  Pain Location: L LE Pain Descriptors / Indicators: Cramping Pain Intervention(s): Monitored during session;Patient requesting pain meds-RN notified;Repositioned  Home Living                                          Prior Functioning/Environment              Frequency  Min 2X/week        Progress Toward Goals  OT Goals(current goals can now be found in the care plan section)  Progress towards OT goals: Progressing toward goals  Acute Rehab OT Goals Patient Stated Goal: return to previous activities OT Goal Formulation: With patient  Plan      Co-evaluation                 AM-PAC PT "6 Clicks" Daily Activity     Outcome Measure   Help from another person eating meals?: None Help from another person taking care of personal grooming?: A Little Help from  another person toileting, which includes using toliet, bedpan, or urinal?: A Little Help from another person bathing (including washing, rinsing, drying)?: A Little Help from another person to put on and taking off regular upper body clothing?: A Little Help from another person to put on and taking off regular lower body clothing?: A Lot 6 Click Score: 18    End of Session    OT Visit Diagnosis: Muscle weakness (generalized) (M62.81);History of falling (Z91.81)   Activity Tolerance Patient limited by pain   Patient Left in chair;with call bell/phone within reach;with chair alarm set   Nurse Communication          Time: [phone] OT Time Calculation (min): 22 min  Charges: OT General Charges $OT Visit: 1 Procedure OT Treatments $Self Care/Home Management : 8-22 mins     Zannie Kehr Chung [name] 09/11/2016, 11:29 AM

## 2016-09-11 NOTE — Progress Notes (Signed)
Patient ID: Anne Black, female   DOB: 20-May-1960, 56 y.o.   MRN: [id number] All event noted Stable from ortho standpoint PT and OT notes reviewed Will need close follow up and certainly may be best served in a SNF environment post op Amanda Pea MD

## 2016-09-11 NOTE — Progress Notes (Signed)
Pt discharged to home. Left unit in wheelchair pushed by nurse tech accompanied by kids. Left in stable condition. [name].

## 2016-09-11 NOTE — Discharge Summary (Signed)
Physician Discharge Summary  Anne Black ZOX:[id number] DOB: 1960-12-07 DOA: 09/09/2016  PCP: Adela Glimpse, PA-C  Admit date: 09/09/2016 Discharge date: 09/11/2016  Admitted From:Home Disposition:  Home  Recommendations for Outpatient Follow-up:  1. Follow up with PCP in 1-2 weeks 2. Please obtain BMP/CBC in one week   Home Health: YES Equipment/Devices:HHPT  Discharge Condition: Stable CODE STATUS:*FULL Diet recommendation: low sodium   Brief/Interim Summary: 56 y.o. female with medical history significant for alcohol dependence, alcoholic cirrhosis, bipolar disorder, and multiple sclerosis, now presenting to the emergency department with severe left forearm pain after mechanical fall. Patient reports that she had been in her usual state of health and was in a parking lot when she tripped, falling onto an outstretched left arm. She experienced immediate and severe pain at this site and EMS was called out. Radiographs of the left forearm demonstrate impacted and comminuted intra-articular fracture of the distal left radius as well as avulsion fracture of the ulnar styloid process. . . Hand surgery was consulted to assist in management.  Dr. Amanda Pea performed ORIF with DVR croissant plate and left carpal tunnel release on 09/10/16. Physical therapy was consulted after surgery and recommended home health physical therapy. On 09/10/2016, the patient wanted to leave AGAINST MEDICAL ADVICE. However, she ultimately agreed to stay.  Discharge Diagnoses:  Left comminuted distal radius fracture and ulnar styloid fracture -Status post ORIF with DVR plate-Dr. Amanda Pea on 09/10/16 -PT eval-->HHPT -home with oxycodone 10 mg #12, 1-2 po q 4 hours prn pain, no RF The [place] Controlled Substance Reporting System has been queried for this patient for the past 12 months prior to prescribing any opioids.  Alcohol dependence  - Last drink was 1-2 days before admission.  - Sleeping this am,  Monitor with CIWA and prn Ativan - Supplement vitamins and minerals -no signs of withdrawal during the admission  Alcoholic cirrhosis  - Appears well-compensated  - Can resume LAsix 40 daily and aldactone - Continue daily PPI   Hyponatremia -secondary to alcoholic cirrhosis, meds, and a degree of volume depletion -stable, improving with IVF  COPD -stable on RA without wheeze   Bipolar disorder - Stable - Continue Risperdal, Topamax, Buspar   Multiple sclerosis  - Follows with neurology at [facility] - Has been stable and managed with supportive care only   Tobacco abuse - nicotine patch stopped on 4/15  Migraine headaches -Topamax discontinued without recurrence of headache  Hypokalemia -repleted  Thrombocytopenia -this has been chronic -Secondary to liver cirrhosis  ADHD -continue vyvanse  Discharge Instructions  Discharge Instructions    Diet - low sodium heart healthy    Complete by:  As directed    Increase activity slowly    Complete by:  As directed      Allergies as of 09/11/2016   No Known Allergies     Medication List    STOP taking these medications   amantadine 100 MG capsule Commonly known as:  SYMMETREL   hydrOXYzine 50 MG tablet Commonly known as:  ATARAX/VISTARIL     TAKE these medications   acetaminophen 325 MG tablet Commonly known as:  TYLENOL Take 650 mg by mouth every 6 (six) hours as needed for moderate pain.   albuterol 108 (90 Base) MCG/ACT inhaler Commonly known as:  PROVENTIL HFA;VENTOLIN HFA Inhale 2 puffs into the lungs every 6 (six) hours as needed for wheezing or shortness of breath.   busPIRone 15 MG tablet Commonly known as:  BUSPAR TAKE 1 TABLET(15  MG) BY MOUTH TWICE DAILY   cholestyramine light 4 g packet Commonly known as:  PREVALITE Take 1 packet (4 g total) by mouth 3 (three) times daily. With meals   FLUoxetine 20 MG capsule Commonly known as:  PROZAC Take 20 mg by mouth 2 (two) times daily.     Fluticasone-Salmeterol 100-50 MCG/DOSE Aepb Commonly known as:  ADVAIR Inhale 2 Inhalers into the lungs 2 (two) times daily.   furosemide 40 MG tablet Commonly known as:  LASIX Take 1 tablet (40 mg total) by mouth daily.   gabapentin 100 MG capsule Commonly known as:  NEURONTIN Take 2 capsules (200 mg total) by mouth 3 (three) times daily. What changed:  when to take this  reasons to take this   lisdexamfetamine 70 MG capsule Commonly known as:  VYVANSE Take 70 mg by mouth daily.   Melatonin 3 MG Tabs Take 1 tablet (3 mg total) by mouth at bedtime as needed (sleep).   modafinil 200 MG tablet Commonly known as:  PROVIGIL Take 200 mg by mouth daily.   ondansetron 4 MG disintegrating tablet Commonly known as:  ZOFRAN ODT Take 1 tablet (4 mg total) by mouth every 8 (eight) hours as needed for nausea or vomiting. 4mg  ODT q4 hours prn nausea/vomit   Oxycodone HCl 10 MG Tabs Take 1 tablet (10 mg total) by mouth every 4 (four) hours as needed for severe pain (1 to 2 tabs as needed for pain).   pantoprazole 40 MG tablet Commonly known as:  PROTONIX Take 1 tablet (40 mg total) by mouth daily.   rifaximin 550 MG Tabs tablet Commonly known as:  XIFAXAN Take 1 tablet (550 mg total) by mouth 2 (two) times daily.   risperiDONE 0.5 MG tablet Commonly known as:  RISPERDAL Take 1 tablet (0.5 mg total) by mouth 3 (three) times daily as needed (agitation). What changed:  when to take this   spironolactone 100 MG tablet Commonly known as:  ALDACTONE Take 1 tablet (100 mg total) by mouth daily.   topiramate 100 MG tablet Commonly known as:  TOPAMAX Take 1 tablet (100 mg total) by mouth daily.       No Known Allergies  Consultations:  Hand Surgery, [facility]   Procedures/Studies: Dg Wrist Complete Left  Result Date: 09/09/2016 CLINICAL DATA:  Status post fall with wrist pain. EXAM: LEFT WRIST - COMPLETE 3+ VIEW COMPARISON:  None. FINDINGS: There is an impacted comminuted  intra-articular fracture of the distal left radius. There is a 1/3 shaft posterior dislocation of the distal fracture fragment. There is an accompanied avulsion fracture of the ulnar styloid process. Associated soft tissue swelling. IMPRESSION: Impacted comminuted intra-articular fracture of the distal left radius. Accompanied avulsion fracture of the ulnar styloid process. Electronically Signed   By: Ted Mcalpine M.D.   On: 09/09/2016 14:13   Dg Chest 2v Repeat Same Day  Result Date: 09/09/2016 CLINICAL DATA:  Abnormality seen at right lung base on prior chest x-ray. EXAM: CHEST  2 VIEW COMPARISON:  Earlier the same day FINDINGS: Patient was unable to stand for true PA frontal positioning. As such, an AP exam was again obtained. The focal opacity seen at the right lung base on the prior study is not evident on this film. Left lung is clear. The cardiopericardial silhouette is within normal limits for size. The visualized bony structures of the thorax are intact. Telemetry leads overlie the chest. IMPRESSION: Limited study due to the patient's inability to stand for PA imaging.  Abnormality at the right base on the prior study not readily evident on this film. Finding unlikely to be an acute abnormality and repeat nonemergent two-view chest x-ray, after resolution of acute symptoms, could be used to further evaluate. Electronically Signed   By: Kennith Center M.D.   On: 09/09/2016 18:35   Dg Chest Port 1 View  Result Date: 09/09/2016 CLINICAL DATA:  Hypoxia.  Long-term smoker.  History of COPD. EXAM: PORTABLE CHEST 1 VIEW COMPARISON:  Chest x-ray of January 28, 2016 FINDINGS: The lungs are well-expanded. There is subtle ovoid increased density in the right infrahilar region. The interstitial markings are coarse. There is no pleural effusion. The heart is normal in size. The pulmonary vascularity is not engorged. There is calcification in the wall of the aortic arch. The trachea is midline. The bony  thorax exhibits no acute abnormality. IMPRESSION: COPD. Subtle increased density just above the right hemidiaphragm merits further evaluation either with a PA and lateral chest x-ray or chest CT scan to exclude an abnormal nodule. There is no CHF. Thoracic aortic atherosclerosis. Electronically Signed   By: Taimi Towe  [place] M.D.   On: 09/09/2016 16:29        Discharge Exam: Vitals:   09/11/16 0534 09/11/16 1315  BP: (!) 102/57 (!) 115/57  Pulse: 82 84  Resp: 16 16  Temp: 98 F (36.7 C) 98 F (36.7 C)   Vitals:   09/10/16 1410 09/10/16 2017 09/11/16 0534 09/11/16 1315  BP: (!) 116/56 (!) 119/57 (!) 102/57 (!) 115/57  Pulse: 89 85 82 84  Resp: 16 16 16 16   Temp: 97.7 F (36.5 C) 98.1 F (36.7 C) 98 F (36.7 C) 98 F (36.7 C)  TempSrc: Oral Oral Oral   SpO2: 96% 94% 95% 99%  Weight:   65.9 kg (145 lb 4.5 oz)   Height:        General: Pt is alert, awake, not in acute distress Cardiovascular: RRR, S1/S2 +, no rubs, no gallops Respiratory: CTA bilaterally, no wheezing, no rhonchi Abdominal: Soft, NT, ND, bowel sounds + Extremities: no edema, no cyanosis   The results of significant diagnostics from this hospitalization (including imaging, microbiology, ancillary and laboratory) are listed below for reference.    Significant Diagnostic Studies: Dg Wrist Complete Left  Result Date: 09/09/2016 CLINICAL DATA:  Status post fall with wrist pain. EXAM: LEFT WRIST - COMPLETE 3+ VIEW COMPARISON:  None. FINDINGS: There is an impacted comminuted intra-articular fracture of the distal left radius. There is a 1/3 shaft posterior dislocation of the distal fracture fragment. There is an accompanied avulsion fracture of the ulnar styloid process. Associated soft tissue swelling. IMPRESSION: Impacted comminuted intra-articular fracture of the distal left radius. Accompanied avulsion fracture of the ulnar styloid process. Electronically Signed   By: Ted Mcalpine M.D.   On: 09/09/2016 14:13    Dg Chest 2v Repeat Same Day  Result Date: 09/09/2016 CLINICAL DATA:  Abnormality seen at right lung base on prior chest x-ray. EXAM: CHEST  2 VIEW COMPARISON:  Earlier the same day FINDINGS: Patient was unable to stand for true PA frontal positioning. As such, an AP exam was again obtained. The focal opacity seen at the right lung base on the prior study is not evident on this film. Left lung is clear. The cardiopericardial silhouette is within normal limits for size. The visualized bony structures of the thorax are intact. Telemetry leads overlie the chest. IMPRESSION: Limited study due to the patient's inability to stand for PA  imaging. Abnormality at the right base on the prior study not readily evident on this film. Finding unlikely to be an acute abnormality and repeat nonemergent two-view chest x-ray, after resolution of acute symptoms, could be used to further evaluate. Electronically Signed   By: Kennith Center M.D.   On: 09/09/2016 18:35   Dg Chest Port 1 View  Result Date: 09/09/2016 CLINICAL DATA:  Hypoxia.  Long-term smoker.  History of COPD. EXAM: PORTABLE CHEST 1 VIEW COMPARISON:  Chest x-ray of January 28, 2016 FINDINGS: The lungs are well-expanded. There is subtle ovoid increased density in the right infrahilar region. The interstitial markings are coarse. There is no pleural effusion. The heart is normal in size. The pulmonary vascularity is not engorged. There is calcification in the wall of the aortic arch. The trachea is midline. The bony thorax exhibits no acute abnormality. IMPRESSION: COPD. Subtle increased density just above the right hemidiaphragm merits further evaluation either with a PA and lateral chest x-ray or chest CT scan to exclude an abnormal nodule. There is no CHF. Thoracic aortic atherosclerosis. Electronically Signed   By: Chanler Mendonca  [place] M.D.   On: 09/09/2016 16:29     Microbiology: Recent Results (from the past 240 hour(s))  MRSA PCR Screening     Status: None    Collection Time: 09/09/16 11:57 PM  Result Value Ref Range Status   MRSA by PCR NEGATIVE NEGATIVE Final    Comment:        The GeneXpert MRSA Assay (FDA approved for NASAL specimens only), is one component of a comprehensive MRSA colonization surveillance program. It is not intended to diagnose MRSA infection nor to guide or monitor treatment for MRSA infections.   Culture, Urine     Status: None (Preliminary result)   Collection Time: 09/10/16  1:15 AM  Result Value Ref Range Status   Specimen Description URINE, CLEAN CATCH  Final   Special Requests NONE  Final   Culture   Final    CULTURE REINCUBATED FOR BETTER GROWTH Performed at [facility], [address], [place], [place] [address]    Report Status PENDING  Incomplete     Labs: Basic Metabolic Panel:  Recent Labs Lab 09/09/16 1720 09/10/16 0532  NA 131* 133*  K 3.4* 3.8  CL 95* 100*  CO2 25 25  GLUCOSE 112* 190*  BUN 11 10  CREATININE 0.40* 0.48  CALCIUM 9.1 8.7*   Liver Function Tests:  Recent Labs Lab 09/09/16 1720 09/10/16 0532  AST 39 39  ALT 28 23  ALKPHOS 133* 116  BILITOT 1.8* 1.1  PROT 7.9 7.1  ALBUMIN 4.2 3.5   No results for input(s): LIPASE, AMYLASE in the last 168 hours.  Recent Labs Lab 09/09/16 1720  AMMONIA 41*   CBC:  Recent Labs Lab 09/09/16 1720 09/10/16 0532  WBC 8.3 6.3  NEUTROABS 7.1  --   HGB 12.5 11.3*  HCT 37.1 34.9*  MCV 88.5 90.2  PLT 136* 138*   Cardiac Enzymes: No results for input(s): CKTOTAL, CKMB, CKMBINDEX, TROPONINI in the last 168 hours. BNP: Invalid input(s): POCBNP CBG: No results for input(s): GLUCAP in the last 168 hours.  Time coordinating discharge:  Greater than 30 minutes  Signed:  Daenerys Buttram, DO [facility] Pager: [phone] 09/11/2016, 3:19 PM

## 2016-09-11 NOTE — Progress Notes (Signed)
Physical Therapy Treatment Patient Details Name: Anne Black MRN: [id number] DOB: [birth year] Today's Date: 09/11/2016    History of Present Illness Pt fell in the community and sustained a L comminuted wrist fx.  She is s/p ORIF and CTR.  PMH:  ETOH, bipolar, MS, and ADHD    PT Comments    Assisted OOB to amb to bathroom very unsteady gait and impaired cognition.   Pt unable to safely use cane at this time and attempted BERG balance test but unable to complete due to increased agitation.  Assisted with amb a great distance in hallway hand held assist.  Very unsteady gait.    Pt will need 24/7 assist when D/C'd.   Follow Up Recommendations  Supervision/Assistance - 24 hour;Home health PT     Equipment Recommendations  None recommended by PT    Recommendations for Other Services       Precautions / Restrictions Precautions Precautions: Fall Precaution Comments: no lifting pulling twisting and gripping with LUE        try to keep wrist elevated Restrictions Weight Bearing Restrictions: Yes LUE Weight Bearing: Non weight bearing    Mobility  Bed Mobility Overal bed mobility: Needs Assistance Bed Mobility: Supine to Sit     Supine to sit: Supervision     General bed mobility comments: in chair.  Transfers Overall transfer level: Needs assistance Equipment used: None Transfers: Sit to/from Stand Sit to Stand: Min assist;Min guard Stand pivot transfers: Min guard;Min assist       General transfer comment: difficulty due to cognition.  Unstaedy.  Required assist for all transfers.  HIGH FALL RISK.  Ambulation/Gait Ambulation/Gait assistance: Min assist Ambulation Distance (Feet): 275 Feet Assistive device: 1 person hand held assist Gait Pattern/deviations: Step-to pattern;Step-through pattern;Staggering right;Staggering left Gait velocity: decreased   General Gait Details: attempted to use a cane however unable to safely use due to cognition.  Very unsteady  gait. HIGH FALL RISK.     Stairs            Wheelchair Mobility    Modified Rankin (Stroke Patients Only)       Balance                                            Cognition Arousal/Alertness: Awake/alert Behavior During Therapy: Impulsive;Anxious Overall Cognitive Status: No family/caregiver present to determine baseline cognitive functioning                                 General Comments: inconsistant following commands.  impulsive      Exercises Other Exercises Other Exercises: digit ROM L hand X 10    General Comments        Pertinent Vitals/Pain Pain Assessment: Faces Pain Score: 8  Faces Pain Scale: Hurts little more Pain Location: L LE  "where I fell" Pain Descriptors / Indicators: Grimacing Pain Intervention(s): Monitored during session    Home Living                      Prior Function            PT Goals (current goals can now be found in the care plan section) Acute Rehab PT Goals Patient Stated Goal: return to previous activities Progress towards PT goals: Progressing toward goals  Frequency    Min 3X/week      PT Plan      Co-evaluation              AM-PAC PT "6 Clicks" Daily Activity  Outcome Measure  Difficulty turning over in bed (including adjusting bedclothes, sheets and blankets)?: Total Difficulty moving from lying on back to sitting on the side of the bed? : Total Difficulty sitting down on and standing up from a chair with arms (e.g., wheelchair, bedside commode, etc,.)?: Total Help needed moving to and from a bed to chair (including a wheelchair)?: A Lot Help needed walking in hospital room?: A Lot Help needed climbing 3-5 steps with a railing? : A Lot 6 Click Score: 9    End of Session Equipment Utilized During Treatment: Gait belt Activity Tolerance: Other (comment) (impaired cognition) Patient left: in chair;with call bell/phone within reach;with chair alarm  set Nurse Communication: Mobility status PT Visit Diagnosis: Unsteadiness on feet (R26.81)     Time: 2947-6546 PT Time Calculation (min) (ACUTE ONLY): 25 min  Charges:  $Gait Training: 8-22 mins $Therapeutic Activity: 8-22 mins                    G Codes:       Felecia Shelling  PTA [facility]  Acute  Rehab Pager      [phone]
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
## 2016-09-11 NOTE — Progress Notes (Signed)
Discharge instructions given. Prescription x 1 given for pain med. Spoke with patient's daughter, Roanna Epley, who said she would be in by 4:00 pm to get patient. Pt made aware. Awaiting daughter's arrival to be taken home. [name].

## 2016-09-12 LAB — URINE CULTURE: Culture: >=100000 — AB

## 2016-09-12 NOTE — Anesthesia Postprocedure Evaluation (Signed)
Anesthesia Post Note  Patient: Anne Black  Procedure(s) Performed: Procedure(s) (LRB): OPEN REDUCTION INTERNAL FIXATION (ORIF) LEFT WRIST FRACTURE, LEFT CARPAL TUNNEL RELEASE (Left)     Patient location during evaluation: PACU Anesthesia Type: General Level of consciousness: awake and alert Pain management: pain level controlled Vital Signs Assessment: post-procedure vital signs reviewed and stable Respiratory status: spontaneous breathing, nonlabored ventilation, respiratory function stable and patient connected to nasal cannula oxygen Cardiovascular status: blood pressure returned to baseline and stable Postop Assessment: no signs of nausea or vomiting Anesthetic complications: no    Last Vitals:  Vitals:   09/11/16 0534 09/11/16 1315  BP: (!) 102/57 (!) 115/57  Pulse: 82 84  Resp: 16 16  Temp: 36.7 C 36.7 C    Last Pain:  Vitals:   09/11/16 1023  TempSrc:   PainSc: 7                  Cecile Hearing

## 2016-09-13 ENCOUNTER — Encounter (HOSPITAL_COMMUNITY): Payer: Self-pay | Admitting: Orthopedic Surgery

## 2016-11-09 ENCOUNTER — Ambulatory Visit: Payer: Managed Care, Other (non HMO) | Admitting: Physician Assistant

## 2016-11-10 ENCOUNTER — Ambulatory Visit (INDEPENDENT_AMBULATORY_CARE_PROVIDER_SITE_OTHER): Payer: 59 | Admitting: Family Medicine

## 2016-11-10 ENCOUNTER — Encounter: Payer: Self-pay | Admitting: Family Medicine

## 2016-11-10 ENCOUNTER — Telehealth: Payer: Self-pay | Admitting: Family Medicine

## 2016-11-10 VITALS — BP 130/70 | HR 102 | Temp 98.2°F | Resp 17 | Ht 62.0 in | Wt 149.8 lb

## 2016-11-10 DIAGNOSIS — H6593 Unspecified nonsuppurative otitis media, bilateral: Secondary | ICD-10-CM

## 2016-11-10 DIAGNOSIS — B3731 Acute candidiasis of vulva and vagina: Secondary | ICD-10-CM

## 2016-11-10 DIAGNOSIS — G47429 Narcolepsy in conditions classified elsewhere without cataplexy: Secondary | ICD-10-CM

## 2016-11-10 DIAGNOSIS — G35 Multiple sclerosis: Secondary | ICD-10-CM | POA: Diagnosis not present

## 2016-11-10 DIAGNOSIS — H7293 Unspecified perforation of tympanic membrane, bilateral: Secondary | ICD-10-CM | POA: Diagnosis not present

## 2016-11-10 DIAGNOSIS — B373 Candidiasis of vulva and vagina: Secondary | ICD-10-CM

## 2016-11-10 DIAGNOSIS — F902 Attention-deficit hyperactivity disorder, combined type: Secondary | ICD-10-CM

## 2016-11-10 DIAGNOSIS — H9201 Otalgia, right ear: Secondary | ICD-10-CM

## 2016-11-10 MED ORDER — FLUCONAZOLE 150 MG PO TABS: 150.0000 mg | ORAL_TABLET | Freq: Once | ORAL | 0 refills | Status: AC

## 2016-11-10 NOTE — Patient Instructions (Addendum)
     IF you received an x-ray today, you will receive an invoice from [facility]. Please contact [facility] at [phone] with questions or concerns regarding your invoice.   IF you received labwork today, you will receive an invoice from [facility]. Please contact [facility] at [phone] with questions or concerns regarding your invoice.   Our billing staff will not be able to assist you with questions regarding bills from these companies.  You will be contacted with the lab results as soon as they are available. The fastest way to get your results is to activate your My Chart account. Instructions are located on the last page of this paperwork. If you have not heard from [place] regarding the results in 2 weeks, please contact this office.    We recommend that you schedule a mammogram for breast cancer screening. Typically, you do not need a referral to do this. Please contact a local imaging center to schedule your mammogram.  [facility] - [phone]  *ask for the Radiology Department [facility]) - [phone] or [phone]  [facility] - [phone] [facility] - [phone] [facility] - [phone]  *ask for the Radiology Department [facility] - [phone]  *ask for the Radiology Department [facility] - [phone]  *ask for the Mammography Department [facility] - [phone] Eardrum Perforation The eardrum is a thin, round tissue inside the ear. It allows you to hear. The eardrum can get torn (perforated). Eardrums often heal on their own. There is often little or no long-term hearing loss. Follow these instructions at home:  Keep your ear dry while it heals. Do not let your head go under water. Do not swim or dive until your doctor says it is okay.  Before you take a bath or shower, do one of these things to keep water out of your  ear: ? Put a waterproof earplug in your ear. ? Put petroleum jelly all over a cotton ball. Put the cotton ball in your ear.  Take medicines only as told by your doctor.  Avoid blowing your nose if you can. If you blow your nose, do it gently.  Continue your normal activities after your eardrum heals. Your doctor will tell you when your eardrum has healed.  Talk to your doctor before you fly on an airplane.  Keep all doctor follow-up visits as told by your doctor. This is important. Contact a doctor if:  You have a fever. Get help right away if:  You have blood or yellowish-white fluid (pus) coming from your ear.  You feel dizzy or off balance.  You feel sick to your stomach (nauseous), or you throw up (vomit).  You have more pain. This information is not intended to replace advice given to you by your health care provider. Make sure you discuss any questions you have with your health care provider. Document Released: 07/15/2009 Document Revised: 07/03/2015 Document Reviewed: 09/03/2013 Elsevier Interactive Patient Education  [employer].

## 2016-11-10 NOTE — Progress Notes (Signed)
Chief Complaint  Patient presents with  . right ear pain    3-4 weeks, on 2nd antibiotic cefdnir without relief, initially placed augmentin with no relief.  Pain level 8/10.  Marland Kitchen ADHD    needs vyvanse refill and needs condition managed    HPI  Ear Pain Pt reports that [facility]. She was started on Cefdnir  She was also seen on 9/22 for the same ear infection with Cefdinir.  This was prescribed after Augmentin failed She reports that she had TM tubes a child with a 30% hearing loss She has not seen ENT in her adulthood.  pain with swallowing due to the otitis and TM dysfunction  Vaginitis She also has vaginitis as a consequence of the Antibiotics  She reports that she now has itching in the vagina She states that she has been having the itching since the augmentin  ADHD  Narcolepsy She reports that she was going to [facility] for primary care where she was getting vyvanse and provigil These were prescribed vyvanse for ADHD and provigil for narcolepsy due to her MS Her neurologist would give her oxycocode prn MS pain  MS She sees her Neurologist for MS once every six months Her MS is a chronic progressive state She states that she does not think her status is relapsing remitting She reports that she is in chronic pain  Past Medical History:  Diagnosis Date  . ADHD (attention deficit hyperactivity disorder)   . Anxiety   . Arthritis   . Ascites   . Bipolar disorder (HCC)   . Cirrhosis (HCC)   . Hypokalemia   . Migraine   . Multiple sclerosis (HCC)   . Osteoporosis     Current Outpatient Prescriptions  Medication Sig Dispense Refill  . acetaminophen (TYLENOL) 325 MG tablet Take 650 mg by mouth every 6 (six) hours as needed for moderate pain.    Marland Kitchen albuterol (PROVENTIL HFA;VENTOLIN HFA) 108 (90 Base) MCG/ACT inhaler Inhale 2 puffs into the lungs every 6 (six) hours as needed for wheezing or shortness of breath.    .  busPIRone (BUSPAR) 15 MG tablet TAKE 1 TABLET(15 MG) BY MOUTH TWICE DAILY 60 tablet 0  . FLUoxetine (PROZAC) 20 MG capsule Take 20 mg by mouth 2 (two) times daily.    . Fluticasone-Salmeterol (ADVAIR) 100-50 MCG/DOSE AEPB Inhale 2 Inhalers into the lungs 2 (two) times daily.    . furosemide (LASIX) 40 MG tablet Take 1 tablet (40 mg total) by mouth daily. 30 tablet 5  . gabapentin (NEURONTIN) 100 MG capsule Take 2 capsules (200 mg total) by mouth 3 (three) times daily. (Patient taking differently: Take 200 mg by mouth 2 (two) times daily as needed (for ;pan). ) 180 capsule 0  . lisdexamfetamine (VYVANSE) 70 MG capsule Take 70 mg by mouth daily.    . Melatonin 3 MG TABS Take 1 tablet (3 mg total) by mouth at bedtime as needed (sleep). 30 tablet 0  . modafinil (PROVIGIL) 200 MG tablet Take 200 mg by mouth daily.  1  . ondansetron (ZOFRAN ODT) 4 MG disintegrating tablet Take 1 tablet (4 mg total) by mouth every 8 (eight) hours as needed for nausea or vomiting.  ODT q4 hours prn nausea/vomit 20 tablet 1  . risperiDONE (RISPERDAL) 0.5 MG tablet Take 1 tablet (0.5 mg total) by mouth 3 (three) times daily as needed (agitation). (Patient taking differently: Take 0.5 mg by mouth 2 (two)  times daily. ) 30 tablet 0  . spironolactone (ALDACTONE) 100 MG tablet Take 1 tablet (100 mg total) by mouth daily. 30 tablet 5  . topiramate (TOPAMAX) 100 MG tablet Take 1 tablet (100 mg total) by mouth daily. 30 tablet 0  . cholestyramine light (PREVALITE) 4 g packet Take 1 packet (4 g total) by mouth 3 (three) times daily. With meals (Patient not taking: Reported on 08/08/2016) 60 packet 3  . fluconazole (DIFLUCAN) 150 MG tablet Take 1 tablet (150 mg total) by mouth once. Take a second tablet in 3 days if needed 2 tablet 0  . oxyCODONE 10 MG TABS Take 1 tablet (10 mg total) by mouth every 4 (four) hours as needed for severe pain (1 to 2 tabs as needed for pain). (Patient not taking: Reported on 11/10/2016) 12 tablet 0  .  pantoprazole (PROTONIX) 40 MG tablet Take 1 tablet (40 mg total) by mouth daily. 30 tablet 3  . rifaximin (XIFAXAN) 550 MG TABS tablet Take 1 tablet (550 mg total) by mouth 2 (two) times daily. (Patient not taking: Reported on 08/08/2016) 60 tablet 0   No current facility-administered medications for this visit.     Allergies: No Known Allergies  Past Surgical History:  Procedure Laterality Date  . CESAREAN SECTION  [id number]  . MYRINGOTOMY WITH TUBE PLACEMENT Bilateral   . ORIF WRIST FRACTURE Left 09/09/2016   Procedure: OPEN REDUCTION INTERNAL FIXATION (ORIF) LEFT WRIST FRACTURE, LEFT CARPAL TUNNEL RELEASE;  Surgeon: Dominica Severin, MD;  Location: [facility];  Service: Orthopedics;  Laterality: Left;  . TONSILLECTOMY      Social History   Social History  . Marital status: Divorced    Spouse name: n/a  . Number of children: 5  . Years of education: College   Occupational History  . disabled     on social security disability for MS   Social History Main Topics  . Smoking status: Former Smoker    Packs/day: 0.75    Years: 20.00    Types: Cigarettes    Quit date: 05/12/2016  . Smokeless tobacco: Never Used     Comment: Currently Vaping Daily  . Alcohol use No     Comment: Heavy ETOH Use in Past- Last Drink 01/23/16  . Drug use: No  . Sexual activity: Not Currently   Other Topics Concern  . None   Social History Narrative   Former Environmental consultant.   Started graduate school in counseling, but didn't finish when a good job came open.   Lives alone.   One daughter lives in [place].   Another daughter is in [place].   The other three live in [place], along with her mother.   Father lives in the [place], [place] area.    Review of Systems  Constitutional: Negative for chills and fever.  HENT: Positive for ear discharge and ear pain.   Eyes: Negative for blurred vision and double vision.  Gastrointestinal: Negative for nausea and vomiting.  Neurological:  Negative for dizziness and headaches.   SEE HPI  Objective: Vitals:   11/10/16 1003  BP: 130/70  Pulse: (!) 102  Resp: 17  Temp: 98.2 F (36.8 C)  TempSrc: Oral  SpO2: 90%  Weight: 149 lb 12.8 oz (67.9 kg)  Height:  (1.575 m)    Physical Exam  Constitutional: She is oriented to person, place, and time. She appears well-developed and well-nourished.  HENT:  Head: Normocephalic and atraumatic.  TM with perforation noted in both ears,  ear exam on the right noted to be exquisitely painful for the patient. No serous drainage. No purulent drainage  Eyes: Conjunctivae and EOM are normal.  Neck: Normal range of motion.  Cardiovascular: Normal rate, regular rhythm and normal heart sounds.   Pulmonary/Chest: Effort normal and breath sounds normal. No respiratory distress. She has no wheezes.  Neurological: She is alert and oriented to person, place, and time.  Skin: Skin is warm. No erythema.  Psychiatric: She has a normal mood and affect. Her behavior is normal. Judgment and thought content normal.    Assessment and Plan Laihla was seen today for right ear pain and adhd.  Diagnoses and all orders for this visit:  Right ear pain -     Ambulatory referral to ENT  Otitis media, serous, tm rupture, bilateral -     Ambulatory referral to ENT Would recommend urgent appt with ENT  Candida vaginitis- will treat empirically with diflucan   Multiple sclerosis [facility])- continue Neurology recs ADHD - pt to fill out med rec release for [facility], will refer to Psychiatry to establish care for ADHD with concomittant Narcolepsy  Other orders -     fluconazole (DIFLUCAN) 150 MG tablet; Take 1 tablet (150 mg total) by mouth once. Take a second tablet in 3 days if needed    Zoe A [employer]

## 2016-11-11 NOTE — Telephone Encounter (Signed)
Spoke with patient, she stated that during her ofc visit "face to face", you told her that you would filll her ADD meds Vyvanse 70 mg and Provigil 200 mg.  She stated that she does not understand why she was told that, and then was told that you would not fill meds. She feels like she's being "ping- pong around and perplexed". She seen ENT today and she was told that it's not her ear,they did get a "little" wax out of ear, but diagnosed with TMJ and has to see a specialist.  pls advise

## 2016-11-16 ENCOUNTER — Telehealth: Payer: Self-pay | Admitting: Family Medicine

## 2016-11-16 NOTE — Telephone Encounter (Signed)
Pt wanting explanation as to why Dr Creta Levin will not treat her for her ADHD.   Advised pt she has been referred to psychiatry to treat her ADHD.  Pt goes on and on about doctor saying she treats it but then saying she won't treat hers and she feels this is unfair and wants to know why.  Advised you were referred so your ADHD could be managed by a specialist in the treatment of ADHD.  Advised Dr made decision she felt best for your situation-advised MD out of office all week but will relay your concerns to her upon her arrival back.  Pt wants doctor to write rx for atleast for a month's worth of vyvanse as it takes weeks to find or get appointment with psychiatrist.   Algis Downs will send message. Pt agreeable. Dgaddy, CMA

## 2016-11-16 NOTE — Telephone Encounter (Signed)
Pt is needing to talk with someone regarding her attention defiecit issues -she has been told that we can treat her and then received a call stating that we can not and she would like to have all this explained to her again  Best number [phone]

## 2016-11-17 NOTE — Telephone Encounter (Signed)
Noted. Thank you.  The patient is established at [facility].  She can get another refill from there.  Please let her know that I would like her to establish care with someone as I am out of the [place] and do not want her to continue to have to wait on me.

## 2016-11-22 ENCOUNTER — Emergency Department (HOSPITAL_COMMUNITY)
Admission: EM | Admit: 2016-11-22 | Discharge: 2016-11-22 | Disposition: A | Discharge status: Home / Self Care | Payer: 59 | Attending: Emergency Medicine | Admitting: Emergency Medicine

## 2016-11-22 ENCOUNTER — Encounter (HOSPITAL_COMMUNITY): Payer: Self-pay | Admitting: Emergency Medicine

## 2016-11-22 ENCOUNTER — Encounter: Payer: Self-pay | Admitting: Neurology

## 2016-11-22 DIAGNOSIS — Z5321 Procedure and treatment not carried out due to patient leaving prior to being seen by health care provider: Secondary | ICD-10-CM | POA: Diagnosis not present

## 2016-11-22 DIAGNOSIS — Z008 Encounter for other general examination: Secondary | ICD-10-CM | POA: Insufficient documentation

## 2016-11-22 NOTE — ED Triage Notes (Signed)
Pt brought in by EMS after her daughter called EMS requesting they do a welfare check on her mother  Daughter is concerned that her mother is not taking care of herself, not taking her medicine, and drinking too much

## 2016-11-22 NOTE — ED Notes (Signed)
Pt states she does not need to be here and does not want to be seen  Pt calling people trying to find a ride home

## 2016-11-23 ENCOUNTER — Emergency Department (HOSPITAL_COMMUNITY)
Admission: EM | Admit: 2016-11-23 | Discharge: 2016-11-23 | Disposition: A | Discharge status: Home / Self Care | Payer: No Typology Code available for payment source | Source: Home / Self Care | Attending: Emergency Medicine | Admitting: Emergency Medicine

## 2016-11-23 ENCOUNTER — Emergency Department (HOSPITAL_COMMUNITY)
Admission: EM | Admit: 2016-11-23 | Discharge: 2016-11-23 | Disposition: A | Discharge status: Home / Self Care | Payer: No Typology Code available for payment source | Attending: Emergency Medicine | Admitting: Emergency Medicine

## 2016-11-23 ENCOUNTER — Encounter (HOSPITAL_COMMUNITY): Payer: Self-pay | Admitting: *Deleted

## 2016-11-23 DIAGNOSIS — F1020 Alcohol dependence, uncomplicated: Secondary | ICD-10-CM | POA: Insufficient documentation

## 2016-11-23 DIAGNOSIS — F1010 Alcohol abuse, uncomplicated: Secondary | ICD-10-CM

## 2016-11-23 DIAGNOSIS — F319 Bipolar disorder, unspecified: Secondary | ICD-10-CM | POA: Insufficient documentation

## 2016-11-23 DIAGNOSIS — K7031 Alcoholic cirrhosis of liver with ascites: Secondary | ICD-10-CM

## 2016-11-23 DIAGNOSIS — Z79899 Other long term (current) drug therapy: Secondary | ICD-10-CM

## 2016-11-23 DIAGNOSIS — G35 Multiple sclerosis: Secondary | ICD-10-CM | POA: Insufficient documentation

## 2016-11-23 DIAGNOSIS — F17210 Nicotine dependence, cigarettes, uncomplicated: Secondary | ICD-10-CM | POA: Diagnosis not present

## 2016-11-23 DIAGNOSIS — IMO0001 Reserved for inherently not codable concepts without codable children: Secondary | ICD-10-CM

## 2016-11-23 LAB — CBC WITH DIFFERENTIAL/PLATELET
BASOS ABS: 0 10*3/uL (ref 0.0–0.1)
BASOS PCT: 1 %
EOS ABS: 0 10*3/uL (ref 0.0–0.7)
EOS PCT: 0 %
HCT: 37 % (ref 36.0–46.0)
Hemoglobin: 12 g/dL (ref 12.0–15.0)
Lymphocytes Relative: 16 %
Lymphs Abs: 1.1 10*3/uL (ref 0.7–4.0)
MCH: 29.4 pg (ref 26.0–34.0)
MCHC: 32.4 g/dL (ref 30.0–36.0)
MCV: 90.7 fL (ref 78.0–100.0)
MONO ABS: 0.7 10*3/uL (ref 0.1–1.0)
Monocytes Relative: 9 %
Neutro Abs: 5.4 10*3/uL (ref 1.7–7.7)
Neutrophils Relative %: 74 %
PLATELETS: 164 10*3/uL (ref 150–400)
RBC: 4.08 MIL/uL (ref 3.87–5.11)
RDW: 15.7 % — AB (ref 11.5–15.5)
WBC: 7.2 10*3/uL (ref 4.0–10.5)

## 2016-11-23 LAB — COMPREHENSIVE METABOLIC PANEL
ALK PHOS: 126 U/L (ref 38–126)
ALT: 16 U/L (ref 14–54)
ANION GAP: 9 (ref 5–15)
AST: 38 U/L (ref 15–41)
Albumin: 3.2 g/dL — ABNORMAL LOW (ref 3.5–5.0)
BILIRUBIN TOTAL: 1.1 mg/dL (ref 0.3–1.2)
BUN: <5 mg/dL — ABNORMAL LOW (ref 6–20)
CALCIUM: 8 mg/dL — AB (ref 8.9–10.3)
CO2: 22 mmol/L (ref 22–32)
CREATININE: 0.35 mg/dL — AB (ref 0.44–1.00)
Chloride: 106 mmol/L (ref 101–111)
Glucose, Bld: 91 mg/dL (ref 65–99)
Potassium: 4 mmol/L (ref 3.5–5.1)
SODIUM: 137 mmol/L (ref 135–145)
TOTAL PROTEIN: 6.5 g/dL (ref 6.5–8.1)

## 2016-11-23 MED ORDER — DEXAMETHASONE SODIUM PHOSPHATE 10 MG/ML IJ SOLN
10.0000 mg | Freq: Once | INTRAMUSCULAR | Status: AC
  Administered 2016-11-23: 10 mg via INTRAMUSCULAR
  Filled 2016-11-23: qty 1

## 2016-11-23 MED ORDER — LORAZEPAM 1 MG PO TABS
1.0000 mg | ORAL_TABLET | Freq: Once | ORAL | Status: AC
  Administered 2016-11-23: 1 mg via ORAL
  Filled 2016-11-23: qty 1

## 2016-11-23 MED ORDER — PANTOPRAZOLE SODIUM 20 MG PO TBEC
20.0000 mg | DELAYED_RELEASE_TABLET | Freq: Once | ORAL | Status: DC
  Filled 2016-11-23: qty 1

## 2016-11-23 MED ORDER — SPIRONOLACTONE 100 MG PO TABS
100.0000 mg | ORAL_TABLET | Freq: Every day | ORAL | 1 refills | Status: DC
Start: 2016-11-23 — End: 2017-11-07

## 2016-11-23 MED ORDER — ONDANSETRON HCL 4 MG/2ML IJ SOLN
4.0000 mg | Freq: Once | INTRAMUSCULAR | Status: AC
  Administered 2016-11-23: 4 mg via INTRAVENOUS
  Filled 2016-11-23: qty 2

## 2016-11-23 MED ORDER — CHLORDIAZEPOXIDE HCL 25 MG PO CAPS: ORAL_CAPSULE | ORAL | 0 refills | Status: DC

## 2016-11-23 MED ORDER — LORAZEPAM 2 MG/ML IJ SOLN
1.0000 mg | Freq: Once | INTRAMUSCULAR | Status: DC
  Filled 2016-11-23: qty 1

## 2016-11-23 NOTE — ED Triage Notes (Signed)
Pt brought in IVC. Pt was IVC'd by daughter for acting aggressive and being extremely drunk. Paperwork states she was advised by medical staff she will die due to cirrhosis but continues to drink alcohol. Paperwork states she is a danger to herself at this time.

## 2016-11-23 NOTE — ED Notes (Signed)
Pt given a diet gingerale for PO fluid challenge

## 2016-11-23 NOTE — ED Notes (Signed)
Bed: WA10 Expected date:  Expected time:  Means of arrival:  Comments: EMS-abdominal pain 

## 2016-11-23 NOTE — Discharge Instructions (Signed)
Below are a list of rehabilitation facilities that You can contact.   Substance Abuse Treatment Programs  Intensive Outpatient Programs [facility]     [address]      [place], [place]                   [phone]       [facility] [address] [place] [address] [place], [place] [phone]  [facility]     (Inpatient and outpatient)     [address]           [phone]    [facility] [phone] (Suboxone and Methadone)  [address]      [place], [place] [address]      [phone]       [address] [place], [place] [phone]  [facility] (Outpatient/Inpatient, [facility])    (insurance only) [phone]             [facility] (Groups & Residential) [place], [place] [phone]     [facility]     [address]     [place], [place]      [phone]       [facility] (for caregivers and family) [phone] [address] [place], [place] [phone]      Residential Treatment Programs [facility]      [address] [place], [place] [address]  [phone]       [facility] [address] [place], [place] [address] [phone]  [facility] [place]        [phone]       [facility] [phone]  [facility] ([facility]             [address]                                         [place], [place]                                                [phone] or [phone]                               [facility] [address] [place], [address] [phone]  [facility]    [address]      [place], [place]     [phone]       [facility] [address] [place], [place] [phone]  [facility]   [address] [place], [place] [address]     [phone]      Admissions: 8am-3pm  M-F  [facility] ([facility]) [address] [place], [place] [phone]  [facility]: Residential Program [phone] Days)   [place], [place]      [phone] or [phone]     [facility]: [facility] [place], [place] (Walk in Hours over the weekend or by referral)  [facility] [address] [place], [place], [place] [address] [phone]  Crisis Mobile: [facility]:  [phone] (for crisis response 24 hours a day) [facility] Hotline:  [phone] Outpatient Psychiatry and Counseling  [facility]: Mobile Crisis Management 24 hours:  [phone]  [facility] [employer] sliding scale fee and walk in schedule: M-F 8am-12pm/1pm-3pm [place], [place] [address] [address] [place], [place] [address] [phone]  [facility] ([facility])- new patient walk-in appointments available Monday - Friday 8am -3pm.          [address] [place], [place] [address] [phone] or crisis line- [facility]/ Intensive Outpatient Therapy Program [place], [place] [address] [place]      [phone] [address] [place], [place] [address]                 [place]   [facility] [phone]. [address] [place], [place] [address]   [facility] Care          [address] Johnette Abraham  [place], [place] [address]       [phone]  [facility] [address], [place], [place] [address] [phone]  [facility]    [address] [place], [place] [address]     [place], [place] Joycelyn Man     [place]  [address]     [phone]       [facility] [place] [address]  [facility]     [address], [place], [place], [place] [address] [place]     [address]     [place], [place] [address]     [phone]       [facility] [address] [place], [place] [address] [phone] Resources for Temporary Residential Assistance/Crisis [place] [facility]) M-F 8am-3pm   [address], [place] [address]   [phone] Services include: laundry, barbering, support groups, case management, phone  & computer access, showers, AA/NA mtgs, mental health/substance abuse nurse, job skills class, disability information, VA assistance, spiritual classes, etc.   [facility]   [address], [place]     [place]              [employer] (women and children)       [place]. [place], [place] [address] [phone] [email or web address] for application and process Application Required  [facility]   [address]    [place] [address]     [phone]                    [place], [place] [address] [id number] application appt.) Application Required  [employer] (women only)    [place], [place]  [place]      Intake starts 6pm daily Need valid ID, SSC, & Police report [employer] [address] [place], [place] [id number] Application Required  [employer] (men only)     [place]      [place], [place]       [place] (Pregnant  women only) [address] [place], [place]  [facility]      [address] [address]      [place], [place] [address]     [phone]             [facility] [address] [place], [place] 90 day commitment/SA/Application process  [facility](men only)     [address]     [place], [place]       Check-in at [facility] [address] [place], [place] [address] [phone] Men/Women/Women and Children must be there by 7 pm  [place], [place]

## 2016-11-23 NOTE — ED Notes (Signed)
Bed: [facility]PT4 Expected date:  Expected time:  Means of arrival:  Comments: 

## 2016-11-23 NOTE — ED Notes (Signed)
Patient called out and stated that she had to use the restroom. Tried to assist patient with bedpan due to weakness and shaking. Patient refused bedpan. Patient stated she could walk. While assisting patient to the restroom patient stated that I needed to have a 2nd set of hands or a wheelchair to help. I notified patient that I was fairly certain that she would make it to the restroom with my assistance. Patient made it to the restroom without issues. Gave patient instructions about collecting urine sample. Patient failed to follow instructions and ruined sample. Patient then stated that I needed to help her get a bath. I instructed that I could set her up to wash herself. Patient then stated that this "wasn't working out" for her and that I was "no help" and "the doctors are too slow around here". She stated that she "might leave" she "has things to do".

## 2016-11-23 NOTE — ED Provider Notes (Signed)
[facility]-EMERGENCY DEPT Provider Note   CSN: [id number] Arrival date & time: 11/23/16  1853     History   Chief Complaint Chief Complaint  Patient presents with  . IVC    HPI Anne Black is a 56 y.o. female.  Pt presents to the ED today with IVC papers taken out by her daughter.  The pt does have a hx of alcohol abuse and cirrhosis.  The pt was seen earlier in the day by Dr. Rhunette Croft.  She was seen because of abdominal pain and request inpatient admission for alcohol abuse.  The pt was evaluated here and was told that we don't perform inpatient detox here if there is not SI/HI.  She was provided with outpatient resources.  She was also given a rx for librium.  The pt was not suicidal or homicidal.  She had no indication for inpatient admission.  The pt's daughter took out IVC papers after pt was discharged and pt was brought back to the ED by the police.  The pt still denies si/hi.  The pt is again requesting admission for alcohol detox.  Pt is also requesting a rx for vyvanse which her doctor took her off of.  She also feels like she may have a MS flare and is requesting a steroid shot.         Past Medical History:  Diagnosis Date  . ADHD (attention deficit hyperactivity disorder)   . Anxiety   . Arthritis   . Ascites   . Bipolar disorder (HCC)   . Cirrhosis (HCC)   . Hypokalemia   . Migraine   . Multiple sclerosis (HCC)   . Osteoporosis     Patient Active Problem List   Diagnosis Date Noted  . Distal radius fracture, left 09/09/2016  . Thrombocytopenia (HCC) 09/09/2016  . Chest x-ray abnormality   . Umbilical hernia without obstruction and without gangrene 05/12/2016  . Itching 02/24/2016  . Ventral hernia without obstruction or gangrene 02/24/2016  . Palliative care encounter   . Ascites   . Tachypnea   . Typical atrial flutter (HCC)   . Hypomagnesemia   . Hypoxia   . Dehydration 01/22/2016  . Low back ache 12/30/2015  .  Non-intractable vomiting with nausea   . Alcohol use disorder 12/15/2015  . Acute alcoholism (HCC)   . Abdominal distension 11/04/2015  . Alcohol abuse   . Dyspnea   . Acute respiratory failure (HCC) 10/15/2015  . Ascites due to alcoholic cirrhosis (HCC) 10/15/2015  . Osteoporosis   . Bipolar I disorder (HCC)   . Multiple sclerosis (HCC)   . Alcoholic cirrhosis of liver without ascites (HCC) 08/22/2015  . Bipolar disorder, current episode mixed, moderate (HCC) 08/01/2015  . Alcohol use disorder, severe, dependence (HCC) 07/31/2015  . Macrocytic anemia- due to alcohol abuse with normal B12 & folate levels 05/31/2015  . Severe protein-calorie malnutrition (HCC) 05/31/2015  . Hypokalemia 05/26/2015  . Encephalopathy, hepatic (HCC) 05/26/2015  . Alcohol withdrawal (HCC) 05/18/2015  . Abdominal pain 05/18/2015  . Anxiety 05/18/2015  . UTI (lower urinary tract infection) 05/18/2015  . Stimulant abuse (HCC) 12/27/2014  . Nicotine abuse 12/27/2014  . Hyperprolactinemia (HCC) 11/15/2014  . Dyslipidemia   . Tobacco abuse 01/23/2014  . Noncompliance with therapeutic plan 04/04/2013  . Unspecified hereditary and idiopathic peripheral neuropathy 05/01/2012  . GERD (gastroesophageal reflux disease) 05/01/2012  . Osteoarthrosis, unspecified whether generalized or localized, involving lower leg 05/01/2012  . Hyponatremia 07/01/2011  Past Surgical History:  Procedure Laterality Date  . CESAREAN SECTION  [id number]  . MYRINGOTOMY WITH TUBE PLACEMENT Bilateral   . ORIF WRIST FRACTURE Left 09/09/2016   Procedure: OPEN REDUCTION INTERNAL FIXATION (ORIF) LEFT WRIST FRACTURE, LEFT CARPAL TUNNEL RELEASE;  Surgeon: Dominica Severin, MD;  Location: [facility]S;  Service: Orthopedics;  Laterality: Left;  . TONSILLECTOMY      OB History    No data available       Home Medications    Prior to Admission medications   Medication Sig Start Date End Date Taking? Authorizing Provider  acetaminophen  (TYLENOL) 325 MG tablet Take 650 mg by mouth every 6 (six) hours as needed for moderate pain.    [provider]  albuterol (PROVENTIL HFA;VENTOLIN HFA) 108 (90 Base) MCG/ACT inhaler Inhale 2 puffs into the lungs every 6 (six) hours as needed for wheezing or shortness of breath.    [provider]  busPIRone (BUSPAR) 15 MG tablet TAKE 1 TABLET(15 MG) BY MOUTH TWICE DAILY 04/19/16   [name], MD  chlordiazePOXIDE (LIBRIUM) 25 MG capsule  PO TID x 1D, then 25-50mg  PO BID X 1D, then 25-50mg  PO QD X 1D 11/23/16   Derwood Kaplan, MD  cholestyramine light (PREVALITE) 4 g packet Take 1 packet (4 g total) by mouth 3 (three) times daily. With meals Patient not taking: Reported on 08/08/2016 03/11/16   [name], MD  FLUoxetine (PROZAC) 20 MG capsule Take 20 mg by mouth daily.  03/30/16   [provider]  Fluticasone-Salmeterol (ADVAIR) 100-50 MCG/DOSE AEPB Inhale 2 Inhalers into the lungs 2 (two) times daily. 11/05/14   [provider]  furosemide (LASIX) 40 MG tablet Take 1 tablet (40 mg total) by mouth daily. 06/17/16   Midge Minium, MD  gabapentin (NEURONTIN) 100 MG capsule Take 2 capsules (200 mg total) by mouth 3 (three) times daily. Patient taking differently: Take 200 mg by mouth 2 (two) times daily as needed (for ;pain).  01/07/16   [name], MD  Lisdexamfetamine Dimesylate 20 MG CHEW Chew 20 mg by mouth daily.     [provider]  Melatonin 3 MG TABS Take 1 tablet (3 mg total) by mouth at bedtime as needed (sleep). 10/24/15   Garth Bigness, MD  modafinil (PROVIGIL) 200 MG tablet Take 200 mg by mouth daily. 08/04/16   [provider]  oxyCODONE 10 MG TABS Take 1 tablet (10 mg total) by mouth every 4 (four) hours as needed for severe pain (1 to 2 tabs as needed for pain). Patient not taking: Reported on 11/10/2016 09/11/16   Tat, Onalee Hua, MD  pantoprazole (PROTONIX) 40 MG tablet Take 1 tablet (40 mg total) by mouth  daily. 12/30/15   Haney, Jeanann Lewandowsky, MD  rifaximin (XIFAXAN) 550 MG TABS tablet Take 1 tablet (550 mg total) by mouth 2 (two) times daily. Patient not taking: Reported on 08/08/2016 06/05/15   Catarina Hartshorn, MD  risperiDONE (RISPERDAL) 0.5 MG tablet Take 1 tablet (0.5 mg total) by mouth 3 (three) times daily as needed (agitation). Patient taking differently: Take 0.5 mg by mouth 2 (two) times daily.  02/06/16   Renne Musca, MD  spironolactone (ALDACTONE) 100 MG tablet Take 1 tablet (100 mg total) by mouth daily. 11/23/16   Derwood Kaplan, MD  topiramate (TOPAMAX) 100 MG tablet Take 1 tablet (100 mg total) by mouth daily. 01/07/16   [name], MD    Family History Family History  Problem Relation  Age of Onset  . Arrhythmia Mother   . Heart disease Father   . Hypertension Father     Social History Social History  Substance Use Topics  . Smoking status: Current Every Day Smoker    Packs/day: 1.00    Years: 20.00    Types: Cigarettes  . Smokeless tobacco: Never Used     Comment: Currently Vaping Daily  . Alcohol use 12.6 oz/week    21 Glasses of wine per week     Allergies   Patient has no known allergies.   Review of Systems Review of Systems  Constitutional: Positive for fatigue.  Neurological: Positive for weakness.  All other systems reviewed and are negative.    Physical Exam Updated Vital Signs BP 131/89 (BP Location: Left Arm)   Pulse 95   Temp 98.4 F (36.9 C)   Resp 18   Ht  (1.575 m)   Wt 68 kg (150 lb)   SpO2 (!) 87%   BMI 27.44 kg/m   Physical Exam  Constitutional: She is oriented to person, place, and time. She appears well-developed and well-nourished.  HENT:  Head: Normocephalic and atraumatic.  Right Ear: External ear normal.  Left Ear: External ear normal.  Nose: Nose normal.  Mouth/Throat: Mucous membranes are dry.  Eyes: Pupils are equal, round, and reactive to light. Conjunctivae and EOM are normal.  Neck: Normal range of  motion. Neck supple.  Cardiovascular: Normal rate, regular rhythm, normal heart sounds and intact distal pulses.   Pulmonary/Chest: Effort normal and breath sounds normal.  Abdominal: Soft. Bowel sounds are normal. She exhibits ascites.  Musculoskeletal: Normal range of motion.  Neurological: She is alert and oriented to person, place, and time.  Skin: Skin is warm and dry.  Psychiatric: She has a normal mood and affect. Her behavior is normal. Judgment and thought content normal.  Nursing note and vitals reviewed.    ED Treatments / Results  Labs (all labs ordered are listed, but only abnormal results are displayed) Labs Reviewed - No data to display  EKG  EKG Interpretation None       Radiology No results found.  Procedures Procedures (including critical care time)  Medications Ordered in ED Medications  dexamethasone (DECADRON) injection 10 mg (10 mg Intramuscular Given 11/23/16 2016)     Initial Impression / Assessment and Plan / ED Course  I have reviewed the triage vital signs and the nursing notes.  Pertinent labs & imaging results that were available during my care of the patient were reviewed by me and considered in my medical decision making (see chart for details).    Nl labs earlier today.  Pt does not meet criteria for IVC.  She has already been given resources for f/u and librium rx.    O2 sat in vitals is incorrect.  She was ambulated and O2 sat 93%.  She is stable for d/c.  Return if worse.  Final Clinical Impressions(s) / ED Diagnoses   Final diagnoses:  Alcohol abuse    New Prescriptions New Prescriptions   No medications on file     Jacalyn Lefevre, MD 11/23/16 2021
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
## 2016-11-23 NOTE — ED Notes (Signed)
Patient requesting that no one be told that shes here.

## 2016-11-23 NOTE — ED Notes (Signed)
Pt has continued to cry and ask about lab results. Pt has been informed multiple times that we cannot give her the results, the MD will do that. Pt is frustrated and telling RN that she "needs a special ride home"
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
## 2016-11-23 NOTE — ED Notes (Signed)
Pt is requesting a steroid shot and reports she is "sad" and "weak as water"

## 2016-11-23 NOTE — ED Notes (Signed)
Sheriff took patient home.

## 2016-11-23 NOTE — ED Notes (Signed)
Patient was walked and O2 sat is 93%.

## 2016-11-23 NOTE — ED Notes (Signed)
Pt assisted to bathroom with wheel chair by RN.  Pt has asked to be given a bath, hemorrhoid cream, treatment for her anxiety, food, a shower, a bedside commode, clean clothes, and continues to make a list of things to ask for.  Pt informed that we are attempting to assist her with her emergent needs and some of her needs can be followed up with with primary care.  Pt keeps repeating "You just don't know how bad I feel" Pt assisted back to her room and pt requested to stay in her wheelchair so she could sit up and eat.

## 2016-11-23 NOTE — ED Notes (Signed)
Pt informed that RN could give her a bus pass but could not provide a taxi voucher. Pt also informed she is not a option for EMS because she is ambulatory. Pt assisted to lobby in wheel chair with all her belongings and assisted to use lobby phone

## 2016-12-03 ENCOUNTER — Telehealth: Payer: Self-pay | Admitting: Family Medicine

## 2016-12-03 NOTE — Telephone Encounter (Signed)
Pt called requesting referral to General Surgery. She said she has a hernia and she would like to get this removed. Pt can be contacted at [phone]. Thanks!

## 2016-12-06 ENCOUNTER — Telehealth: Payer: Self-pay | Admitting: Physician Assistant

## 2016-12-06 MED ORDER — RIZATRIPTAN BENZOATE 10 MG PO TABS: 10.0000 mg | ORAL_TABLET | ORAL | 0 refills | Status: DC | PRN

## 2016-12-06 NOTE — Telephone Encounter (Signed)
Patient called via the Answering Service, reporting a migraine headache and needing a refill of Maxalt. She identified me as her "doctor."  She reports that the headache began yesterday and then resolved. It recurred this morning, and she treated it with acetaminophen with success until this evening. The acetaminophen is no longer effective and she is out of Maxalt. She asked that it be called to her regular pharmacy, stating that she would go get it in the morning.I advised that she should come in to be seen in the morning, to address this problem directly, along with the cough and wheezing audible over the phone, but she didn't think that was necessary, asking that I send in the medication, so she'd "have the option." As such, I agreed to send in the Maxalt to a 24-hour pharmacy, and continued to encourage her to call the office in the morning to schedule an appointment.  I last saw her 03/01/2016, at which time Dr. Dillon Bjork was her PCP.  Her chart lists Ms. Nevada Crane as her PCP, but the patient relates that she has been dismissed from that practice and doesn't know why. At the time of her visit 10/03, and on a telephone call 10/09, we understood that she was established elsewhere and advised that she should contact that provider.  She was seen in our office on 11/10/2016 by one of my colleagues with ear pain, and was referred to ENT, where she was diagnosed with TMJ.   Chart review reveals that she has been seen at [facility] as well, on 11/03/2016. She was also seen in the ED 10/15 and 10/16 due to alcoholism. There are numerous phone calls to Ms. Doylene Canning, requesting refills of medications, and being advised of her non-compliance, which I suspect is the reason that she was dismissed from that practice.  In addition to the migraine, she asks for a referral to general surgery for a hernia that is bleeding and for a refill of Vyvanse. Again, I encouraged her to schedule a visit tomorrow at the  office. She needs a new PCP and evaluation for several issues. I suspect that she does have a hernia (she describes it as being located at the umbilicus), but that there is an abrasion/erosion and/or cellulitis causing the bleeding.  She has been referred to psychiatry for treatment of ADHD. She is also managed by neurology for MS, and for migraines in the past.  I will remove Ms. Aleda Grana name from the chart as her PCP.

## 2016-12-07 ENCOUNTER — Telehealth: Payer: Self-pay | Admitting: Family Medicine

## 2016-12-07 DIAGNOSIS — K429 Umbilical hernia without obstruction or gangrene: Secondary | ICD-10-CM

## 2016-12-07 NOTE — Telephone Encounter (Signed)
Signed encounter in error. Message has not been addressed yet.   Patient initial message on 12/03/16:  "Pt called requesting referral to General Surgery. She said she has a hernia and she would like to get this removed. Pt can be contacted at [phone]. Thanks!"  Patient second message on 12/07/16:  "Pt left message for referrals again on 12/06/16 concerning referral for general surgery. Stated that her hernia is getting worse and bleeding and she woke up the other day in a pool of blood from it. She said she is needing to get this taken care of. Please advise. Pt can be reached at [phone]."

## 2016-12-07 NOTE — Telephone Encounter (Signed)
Pt left message for referrals again on 12/06/16 concerning referral for general surgery. Stated that her hernia is getting worse and bleeding and she woke up the other day in a pool of blood from it. She said she is needing to get this taken care of. Please advise. Pt can be reached at [phone].

## 2016-12-08 ENCOUNTER — Emergency Department (HOSPITAL_COMMUNITY): Payer: 59

## 2016-12-08 ENCOUNTER — Emergency Department (HOSPITAL_COMMUNITY)
Admission: EM | Admit: 2016-12-08 | Discharge: 2016-12-09 | Disposition: A | Discharge status: Home / Self Care | Payer: 59 | Attending: Emergency Medicine | Admitting: Emergency Medicine

## 2016-12-08 DIAGNOSIS — F419 Anxiety disorder, unspecified: Secondary | ICD-10-CM | POA: Insufficient documentation

## 2016-12-08 DIAGNOSIS — J4521 Mild intermittent asthma with (acute) exacerbation: Secondary | ICD-10-CM | POA: Diagnosis not present

## 2016-12-08 DIAGNOSIS — F17210 Nicotine dependence, cigarettes, uncomplicated: Secondary | ICD-10-CM | POA: Diagnosis not present

## 2016-12-08 DIAGNOSIS — R0602 Shortness of breath: Secondary | ICD-10-CM | POA: Insufficient documentation

## 2016-12-08 DIAGNOSIS — F1010 Alcohol abuse, uncomplicated: Secondary | ICD-10-CM | POA: Diagnosis not present

## 2016-12-08 DIAGNOSIS — Z79899 Other long term (current) drug therapy: Secondary | ICD-10-CM | POA: Insufficient documentation

## 2016-12-08 MED ORDER — IPRATROPIUM BROMIDE 0.02 % IN SOLN
0.5000 mg | Freq: Once | RESPIRATORY_TRACT | Status: AC
  Administered 2016-12-09: 0.5 mg via RESPIRATORY_TRACT
  Filled 2016-12-08: qty 2.5

## 2016-12-08 MED ORDER — ALBUTEROL (5 MG/ML) CONTINUOUS INHALATION SOLN
10.0000 mg/h | INHALATION_SOLUTION | Freq: Once | RESPIRATORY_TRACT | Status: AC
  Administered 2016-12-09: 10 mg/h via RESPIRATORY_TRACT
  Filled 2016-12-08: qty 20

## 2016-12-08 NOTE — ED Notes (Signed)
Bed: WA17 Expected date:  Expected time:  Means of arrival:  Comments: EMS 56 yo female from home called x 2 panic attack and abdominal pain-has cirrhosis/ascites

## 2016-12-08 NOTE — ED Triage Notes (Signed)
Patient arrives by EMS with complaints of abdominal pain, EMS reports patient is having panic attacks, patient also has ascites. Patient called EMS twice tonight and declined to come to ED, called EMS again and stated to EMS "I just want to be admitted". Patient refusing to have vital signs checked.

## 2016-12-08 NOTE — Telephone Encounter (Signed)
Please let the patient know that with a situation such as a bleeding hernia and severe pain she needs to go to the ER.
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
## 2016-12-08 NOTE — Telephone Encounter (Signed)
Referral placed.

## 2016-12-08 NOTE — ED Triage Notes (Signed)
Patient has no family and lives by herself.

## 2016-12-08 NOTE — Telephone Encounter (Signed)
Spoke with pt advised with the bleeding and pain she will need to go to ED.  Pt advises hernia stopped bleeding and she is not in any pain but would like referral to general surgery.  Advised dr Creta Levin of pt request and she will send referral.  Pt agreeable. Dgaddy, CMA

## 2016-12-09 ENCOUNTER — Encounter (HOSPITAL_COMMUNITY): Payer: Self-pay

## 2016-12-09 ENCOUNTER — Emergency Department (HOSPITAL_COMMUNITY): Payer: 59

## 2016-12-09 LAB — COMPREHENSIVE METABOLIC PANEL
ALK PHOS: 136 U/L — AB (ref 38–126)
ALT: 23 U/L (ref 14–54)
AST: 53 U/L — AB (ref 15–41)
Albumin: 3.4 g/dL — ABNORMAL LOW (ref 3.5–5.0)
Anion gap: 9 (ref 5–15)
BUN: 8 mg/dL (ref 6–20)
CHLORIDE: 101 mmol/L (ref 101–111)
CO2: 29 mmol/L (ref 22–32)
Calcium: 8.7 mg/dL — ABNORMAL LOW (ref 8.9–10.3)
Creatinine, Ser: 0.38 mg/dL — ABNORMAL LOW (ref 0.44–1.00)
GFR calc Af Amer: >60 mL/min (ref >60)
Glucose, Bld: 101 mg/dL — ABNORMAL HIGH (ref 65–99)
Potassium: 3.9 mmol/L (ref 3.5–5.1)
Sodium: 139 mmol/L (ref 135–145)
TOTAL PROTEIN: 6.7 g/dL (ref 6.5–8.1)
Total Bilirubin: 1.1 mg/dL (ref 0.3–1.2)

## 2016-12-09 LAB — URINALYSIS, ROUTINE W REFLEX MICROSCOPIC
BILIRUBIN URINE: NEGATIVE
Glucose, UA: NEGATIVE mg/dL
HGB URINE DIPSTICK: NEGATIVE
KETONES UR: NEGATIVE mg/dL
Leukocytes, UA: NEGATIVE
NITRITE: NEGATIVE
PH: 7 (ref 5.0–8.0)
Protein, ur: NEGATIVE mg/dL
SPECIFIC GRAVITY, URINE: 1.006 (ref 1.005–1.030)

## 2016-12-09 LAB — CBC WITH DIFFERENTIAL/PLATELET
BASOS PCT: 1 %
Basophils Absolute: 0 10*3/uL (ref 0.0–0.1)
Eosinophils Absolute: 0 10*3/uL (ref 0.0–0.7)
Eosinophils Relative: 0 %
HEMATOCRIT: 35.9 % — AB (ref 36.0–46.0)
HEMOGLOBIN: 11.4 g/dL — AB (ref 12.0–15.0)
LYMPHS ABS: 0.9 10*3/uL (ref 0.7–4.0)
LYMPHS PCT: 12 %
MCH: 29.4 pg (ref 26.0–34.0)
MCHC: 31.8 g/dL (ref 30.0–36.0)
MCV: 92.5 fL (ref 78.0–100.0)
MONO ABS: 0.7 10*3/uL (ref 0.1–1.0)
MONOS PCT: 9 %
NEUTROS ABS: 6 10*3/uL (ref 1.7–7.7)
NEUTROS PCT: 78 %
Platelets: 185 10*3/uL (ref 150–400)
RBC: 3.88 MIL/uL (ref 3.87–5.11)
RDW: 15.8 % — AB (ref 11.5–15.5)
WBC: 7.6 10*3/uL (ref 4.0–10.5)

## 2016-12-09 LAB — MAGNESIUM: Magnesium: 1.7 mg/dL (ref 1.7–2.4)

## 2016-12-09 LAB — D-DIMER, QUANTITATIVE (NOT AT ARMC): D DIMER QUANT: 1.18 ug{FEU}/mL — AB (ref 0.00–0.50)

## 2016-12-09 LAB — RAPID URINE DRUG SCREEN, HOSP PERFORMED
AMPHETAMINES: NOT DETECTED
BENZODIAZEPINES: POSITIVE — AB
Barbiturates: NOT DETECTED
Cocaine: NOT DETECTED
Opiates: NOT DETECTED
TETRAHYDROCANNABINOL: NOT DETECTED

## 2016-12-09 LAB — LIPASE, BLOOD: LIPASE: 29 U/L (ref 11–51)

## 2016-12-09 LAB — ETHANOL: Alcohol, Ethyl (B): <10 mg/dL (ref <10)

## 2016-12-09 LAB — AMMONIA: AMMONIA: 48 umol/L — AB (ref 9–35)

## 2016-12-09 LAB — BRAIN NATRIURETIC PEPTIDE: B Natriuretic Peptide: 63.7 pg/mL (ref 0.0–100.0)

## 2016-12-09 MED ORDER — FLUOXETINE HCL 20 MG PO CAPS: 20.0000 mg | ORAL_CAPSULE | Freq: Every day | ORAL | Status: DC

## 2016-12-09 MED ORDER — VITAMIN B-1 100 MG PO TABS: 100.0000 mg | ORAL_TABLET | Freq: Every day | ORAL | Status: DC

## 2016-12-09 MED ORDER — LORAZEPAM 2 MG/ML IJ SOLN: 0.0000 mg | Freq: Two times a day (BID) | INTRAMUSCULAR | Status: DC

## 2016-12-09 MED ORDER — LORAZEPAM 1 MG PO TABS: 0.0000 mg | ORAL_TABLET | Freq: Two times a day (BID) | ORAL | Status: DC

## 2016-12-09 MED ORDER — RISPERIDONE 0.5 MG PO TABS: 0.5000 mg | ORAL_TABLET | Freq: Two times a day (BID) | ORAL | Status: DC

## 2016-12-09 MED ORDER — MELOXICAM 7.5 MG PO TABS: 7.5000 mg | ORAL_TABLET | Freq: Every day | ORAL | Status: DC

## 2016-12-09 MED ORDER — SPIRONOLACTONE 100 MG PO TABS: 100.0000 mg | ORAL_TABLET | Freq: Every day | ORAL | Status: DC

## 2016-12-09 MED ORDER — LISDEXAMFETAMINE DIMESYLATE 20 MG PO CHEW: 20.0000 mg | CHEWABLE_TABLET | Freq: Every day | ORAL | Status: DC

## 2016-12-09 MED ORDER — FUROSEMIDE 40 MG PO TABS: 40.0000 mg | ORAL_TABLET | Freq: Every day | ORAL | Status: DC

## 2016-12-09 MED ORDER — LORAZEPAM 2 MG/ML IJ SOLN
0.0000 mg | Freq: Four times a day (QID) | INTRAMUSCULAR | Status: DC
  Administered 2016-12-09: 1 mg via INTRAVENOUS
  Filled 2016-12-09: qty 1

## 2016-12-09 MED ORDER — ONDANSETRON 8 MG PO TBDP: 8.0000 mg | ORAL_TABLET | Freq: Three times a day (TID) | ORAL | 0 refills | Status: DC | PRN

## 2016-12-09 MED ORDER — TOPIRAMATE 100 MG PO TABS: 100.0000 mg | ORAL_TABLET | Freq: Every day | ORAL | Status: DC

## 2016-12-09 MED ORDER — THIAMINE HCL 100 MG/ML IJ SOLN: 100.0000 mg | Freq: Every day | INTRAMUSCULAR | Status: DC

## 2016-12-09 MED ORDER — IOPAMIDOL (ISOVUE-370) INJECTION 76%
INTRAVENOUS | Status: AC
  Administered 2016-12-09: 100 mL via INTRAVENOUS
  Filled 2016-12-09: qty 100

## 2016-12-09 MED ORDER — GABAPENTIN 100 MG PO CAPS: 200.0000 mg | ORAL_CAPSULE | Freq: Two times a day (BID) | ORAL | Status: DC | PRN

## 2016-12-09 MED ORDER — MOMETASONE FURO-FORMOTEROL FUM 100-5 MCG/ACT IN AERO: 2.0000 | INHALATION_SPRAY | Freq: Two times a day (BID) | RESPIRATORY_TRACT | Status: DC

## 2016-12-09 MED ORDER — IOPAMIDOL (ISOVUE-370) INJECTION 76%
100.0000 mL | Freq: Once | INTRAVENOUS | Status: AC | PRN
  Administered 2016-12-09: 100 mL via INTRAVENOUS

## 2016-12-09 MED ORDER — LORAZEPAM 1 MG PO TABS: 0.0000 mg | ORAL_TABLET | Freq: Four times a day (QID) | ORAL | Status: DC

## 2016-12-09 MED ORDER — ALBUTEROL SULFATE HFA 108 (90 BASE) MCG/ACT IN AERS: 2.0000 | INHALATION_SPRAY | Freq: Four times a day (QID) | RESPIRATORY_TRACT | Status: DC | PRN

## 2016-12-09 MED ORDER — PROMETHAZINE HCL 25 MG PO TABS: 25.0000 mg | ORAL_TABLET | Freq: Four times a day (QID) | ORAL | Status: DC | PRN

## 2016-12-09 MED ORDER — BUSPIRONE HCL 10 MG PO TABS: 15.0000 mg | ORAL_TABLET | Freq: Two times a day (BID) | ORAL | Status: DC

## 2016-12-09 MED ORDER — CHLORDIAZEPOXIDE HCL 25 MG PO CAPS: ORAL_CAPSULE | ORAL | 0 refills | Status: DC

## 2016-12-09 MED ORDER — MODAFINIL 200 MG PO TABS: 200.0000 mg | ORAL_TABLET | Freq: Every day | ORAL | Status: DC

## 2016-12-09 NOTE — ED Notes (Signed)
Pt family at bedside requesting to speak with provider prior to discharge. Patria Mane, MD made aware and en route to speak with pt and family.

## 2016-12-09 NOTE — ED Notes (Signed)
Bed: [facility] Expected date:  Expected time:  Means of arrival:  Comments: 

## 2016-12-09 NOTE — ED Notes (Signed)
PT REMAINS HERE TO WAIT FOR SOCIAL CONSULT.

## 2016-12-09 NOTE — BHH Counselor (Signed)
Per Donell SievertSpencer Simon, PA-C: Patient does not meet inpatient criteria.  Patient recommended for discharge with outpatient resources.  Recommendations for Patient given suicide prevention information, 24 hour crisis numbers, and referrals to outpatient Medicaid providers.    Irine SealWL-EDP, [place], MD notified at [phone].  Patient's RN, Jonny RuizJohn was unable available.  Marchelle Folksmanda reported contact with RN to provide faxed documents for outpatient resources.  [phone].

## 2016-12-09 NOTE — Discharge Instructions (Addendum)
Look at the information about getting a psychiatrist to evaluate your anxiety. There is also information about getting help with your alcohol abuse. You can continue to call [facility] daily to see when they have an available bed. You need to get a primary care doctor to manage your medical problems.
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
## 2016-12-09 NOTE — Clinical Social Work Note (Cosign Needed)
Clinical Social Work Assessment  Patient Details  Name: Anne Black MRN: [id number] Date of Birth: 29-Dec-1960  Date of referral:  12/09/16               Reason for consult:  Housing Concerns/Homelessness                Permission sought to share information with:    Permission granted to share information::     Name::        Agency::     Relationship::     Contact Information:     Housing/Transportation Living arrangements for the past 2 months:  Single Family Home Source of Information:  Patient Patient Interpreter Needed:  None Criminal Activity/Legal Involvement Pertinent to Current Situation/Hospitalization:    Significant Relationships:  None Lives with:  Self Do you feel safe going back to the place where you live?    Need for family participation in patient care:     Care giving concerns:  Patient admitted to hospital by EMS with complaints of abdominal pain, EMS reports patient is having panic attacks, patient also has ascites. 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  Social Worker assessment / plan:  CSW intern met with patient via bedside to discuss housing concerns. Patient informed Probation officer that she feels very lonely and has no supports in place. Patient is actively looking for a facility and has called [facility], but there were no beds available. Patient stated she was living in her home alone before coming to the hospital.   Patient voiced concerns saying, "sometimes I feel like I have no reason to live anymore". CSW intern relayed this information to the doctor on duty and the doctor is going to see patient to discuss these concerns. CSW intern will place information for DSS on patients AVS in the event she would like additional resources.   Employment status:  Disabled (Comment on whether or not currently receiving Disability) Insurance information:  Managed Medicare PT Recommendations:    Information / Referral to community resources:      Patient/Family's Response to care:  Patient was calm, however asked a multitude of questions to this Probation officer and continued to express her concerns regarding her anxiety.  Patient/Family's Understanding of and Emotional Response to Diagnosis, Current Treatment, and Prognosis:  Patient is very understanding of diagnoses and current treatment. Patient is actively seeking help for her concerns regarding anxiety.  Emotional Assessment Appearance:  Appears stated age Attitude/Demeanor/Rapport:    Affect (typically observed):  Anxious, Hopeless, Sad Orientation:  Oriented to Self, Oriented to Place, Oriented to  Time, Oriented to Situation Alcohol / Substance use:  Alcohol Use Psych involvement (Current and /or in the community):  No (Comment)  Discharge Needs  Concerns to be addressed:  Home Safety Concerns Readmission within the last 30 days:  No Current discharge risk:  None Barriers to Discharge:  No Barriers Identified   Willeen Niece, Student-Social Work 12/09/2016, 8:48 AM

## 2016-12-09 NOTE — ED Notes (Signed)
Bed: [facility] Expected date:  Expected time:  Means of arrival:  Comments: 
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
## 2016-12-13 ENCOUNTER — Encounter (HOSPITAL_COMMUNITY): Payer: Self-pay

## 2016-12-13 ENCOUNTER — Emergency Department (HOSPITAL_COMMUNITY)
Admission: EM | Admit: 2016-12-13 | Discharge: 2016-12-13 | Disposition: A | Discharge status: Home / Self Care | Payer: 59 | Attending: Emergency Medicine | Admitting: Emergency Medicine

## 2016-12-13 ENCOUNTER — Telehealth: Payer: Self-pay | Admitting: Family Medicine

## 2016-12-13 ENCOUNTER — Emergency Department (HOSPITAL_COMMUNITY): Payer: 59

## 2016-12-13 DIAGNOSIS — Z79899 Other long term (current) drug therapy: Secondary | ICD-10-CM | POA: Diagnosis not present

## 2016-12-13 DIAGNOSIS — K429 Umbilical hernia without obstruction or gangrene: Secondary | ICD-10-CM | POA: Diagnosis not present

## 2016-12-13 DIAGNOSIS — F17210 Nicotine dependence, cigarettes, uncomplicated: Secondary | ICD-10-CM | POA: Diagnosis not present

## 2016-12-13 DIAGNOSIS — R0602 Shortness of breath: Secondary | ICD-10-CM | POA: Diagnosis present

## 2016-12-13 LAB — CBC WITH DIFFERENTIAL/PLATELET
Basophils Absolute: 0.1 10*3/uL (ref 0.0–0.1)
Basophils Relative: 1 %
Eosinophils Absolute: 0 10*3/uL (ref 0.0–0.7)
Eosinophils Relative: 0 %
HCT: 35.4 % — ABNORMAL LOW (ref 36.0–46.0)
Hemoglobin: 11 g/dL — ABNORMAL LOW (ref 12.0–15.0)
Lymphocytes Relative: 18 %
Lymphs Abs: 1.4 10*3/uL (ref 0.7–4.0)
MCH: 29.5 pg (ref 26.0–34.0)
MCHC: 31.1 g/dL (ref 30.0–36.0)
MCV: 94.9 fL (ref 78.0–100.0)
Monocytes Absolute: 0.7 10*3/uL (ref 0.1–1.0)
Monocytes Relative: 9 %
Neutro Abs: 5.5 10*3/uL (ref 1.7–7.7)
Neutrophils Relative %: 72 %
Platelets: 162 10*3/uL (ref 150–400)
RBC: 3.73 MIL/uL — ABNORMAL LOW (ref 3.87–5.11)
RDW: 15.1 % (ref 11.5–15.5)
WBC: 7.6 10*3/uL (ref 4.0–10.5)

## 2016-12-13 LAB — BASIC METABOLIC PANEL
Anion gap: 8 (ref 5–15)
BUN: <5 mg/dL — ABNORMAL LOW (ref 6–20)
CO2: 28 mmol/L (ref 22–32)
Calcium: 8 mg/dL — ABNORMAL LOW (ref 8.9–10.3)
Chloride: 102 mmol/L (ref 101–111)
Creatinine, Ser: 0.4 mg/dL — ABNORMAL LOW (ref 0.44–1.00)
GFR calc Af Amer: >60 mL/min (ref >60)
GFR calc non Af Amer: >60 mL/min (ref >60)
Glucose, Bld: 85 mg/dL (ref 65–99)
Potassium: 3.3 mmol/L — ABNORMAL LOW (ref 3.5–5.1)
Sodium: 138 mmol/L (ref 135–145)

## 2016-12-13 MED ORDER — FENTANYL CITRATE (PF) 100 MCG/2ML IJ SOLN
50.0000 ug | Freq: Once | INTRAMUSCULAR | Status: AC
  Administered 2016-12-13: 50 ug via INTRAVENOUS
  Filled 2016-12-13: qty 2

## 2016-12-13 MED ORDER — METHYLPREDNISOLONE SODIUM SUCC 125 MG IJ SOLR
125.0000 mg | Freq: Once | INTRAMUSCULAR | Status: AC
  Administered 2016-12-13: 125 mg via INTRAVENOUS
  Filled 2016-12-13: qty 2

## 2016-12-13 MED ORDER — SODIUM CHLORIDE 0.9 % IV BOLUS (SEPSIS)
500.0000 mL | Freq: Once | INTRAVENOUS | Status: AC
  Administered 2016-12-13: 500 mL via INTRAVENOUS

## 2016-12-13 MED ORDER — PREDNISONE 10 MG PO TABS: 40.0000 mg | ORAL_TABLET | Freq: Every day | ORAL | 0 refills | Status: AC

## 2016-12-13 MED ORDER — IPRATROPIUM-ALBUTEROL 0.5-2.5 (3) MG/3ML IN SOLN
3.0000 mL | Freq: Once | RESPIRATORY_TRACT | Status: AC
  Administered 2016-12-13: 3 mL via RESPIRATORY_TRACT
  Filled 2016-12-13: qty 3

## 2016-12-13 NOTE — Telephone Encounter (Signed)
Pt is calling to see if Anne Black could call in some more prednisone for her.  She is having a lot of trouble breathing.  I did advise pt that she either needs to be seen today by us or at the ED since we ask for 2-3 days for requests to go through but she insisted that I put in a message.  Please advise  [phone]

## 2016-12-13 NOTE — ED Notes (Signed)
Pt has large abdominal hernia that has dried blood at bottom of hernia. No bleeding noted  At present.

## 2016-12-13 NOTE — ED Notes (Addendum)
Placed pt on 2 liters of O2 due to O2 sats 84%

## 2016-12-13 NOTE — ED Notes (Signed)
Pt picking at hernia and caused bleeding again, ABD pad applied and pt cleaned of all dried blood on abdomen and legs.

## 2016-12-13 NOTE — ED Provider Notes (Signed)
[facility] EMERGENCY DEPARTMENT Provider Note   CSN: [id number] Arrival date & time: 12/13/16  1405     History   Chief Complaint Chief Complaint  Patient presents with  . Shortness of Breath  . Wound Check    HPI Anne Black is a 56 y.o. female with history of anxiety, cirrhosis, MS, bipolar disorder, umbilical hernia tobacco abuse, alcohol abuse,  and noncompliance who presents today with chief complaint acute onset, intermittent pain and bleeding to the umbilical hernia which occurred earlier today.  Patient states that when she awoke she noted a small amount of bleeding from her umbilical hernia.  She states that this is happened in the past and typically occurs when she scratches at the site.  Pain is described as a soreness which does not radiate.  She states that she has been seen and evaluated by general surgeon for hernia but never followed up.  She endorses intermittent nausea and vomiting for "weeks "which is chronic and unchanged.  Denies aggravating or alleviating factors.  Has not tried anything for her symptoms.  Endorses chills but no fevers.  Denies hematuria, dysuria, melena, or hematochezia.  States that she had one episode of watery nonbloody loose stools earlier today.  She also complains of shortness of breath which has been going on for "a while ", the patient is unable to provide any specifics.  Denies chest pain.  Denies cough.  States that she smokes approximately 1 pack/day and has been smoking for decades.  She received 2 albuterol nebulizers and Atrovent while in the ambulance en route to the hospital which she states was helpful.  She states that her shortness of breath typically is related to anxiety attacks.  She states she drinks 1 large cup of wine earlier today.  She is states she typically drinks several cups of wine daily.  She states earlier she was calling multiple rehabilitation centers attempting to find placement for alcoholism  treatment but has been unsuccessful thus far. Denies SI or HI.   The history is provided by the patient.    Past Medical History:  Diagnosis Date  . ADHD (attention deficit hyperactivity disorder)   . Anxiety   . Arthritis   . Ascites   . Bipolar disorder (HCC)   . Cirrhosis (HCC)   . Hypokalemia   . Migraine   . Multiple sclerosis (HCC)   . Osteoporosis     Patient Active Problem List   Diagnosis Date Noted  . Distal radius fracture, left 09/09/2016  . Thrombocytopenia (HCC) 09/09/2016  . Chest x-ray abnormality   . Umbilical hernia without obstruction and without gangrene 05/12/2016  . Itching 02/24/2016  . Ventral hernia without obstruction or gangrene 02/24/2016  . Palliative care encounter   . Ascites   . Tachypnea   . Typical atrial flutter (HCC)   . Hypomagnesemia   . Hypoxia   . Dehydration 01/22/2016  . Low back ache 12/30/2015  . Non-intractable vomiting with nausea   . Alcohol use disorder 12/15/2015  . Acute alcoholism (HCC)   . Abdominal distension 11/04/2015  . Alcohol abuse   . Dyspnea   . Acute respiratory failure (HCC) 10/15/2015  . Ascites due to alcoholic cirrhosis (HCC) 10/15/2015  . Osteoporosis   . Bipolar I disorder (HCC)   . Multiple sclerosis (HCC)   . Alcoholic cirrhosis of liver without ascites (HCC) 08/22/2015  . Bipolar disorder, current episode mixed, moderate (HCC) 08/01/2015  . Alcohol use disorder, severe, dependence (  HCC) 07/31/2015  . Macrocytic anemia- due to alcohol abuse with normal B12 & folate levels 05/31/2015  . Severe protein-calorie malnutrition (HCC) 05/31/2015  . Hypokalemia 05/26/2015  . Encephalopathy, hepatic (HCC) 05/26/2015  . Alcohol withdrawal (HCC) 05/18/2015  . Abdominal pain 05/18/2015  . Anxiety 05/18/2015  . UTI (lower urinary tract infection) 05/18/2015  . Stimulant abuse (HCC) 12/27/2014  . Nicotine abuse 12/27/2014  . Hyperprolactinemia (HCC) 11/15/2014  . Dyslipidemia   . Tobacco abuse  01/23/2014  . Noncompliance with therapeutic plan 04/04/2013  . Unspecified hereditary and idiopathic peripheral neuropathy 05/01/2012  . GERD (gastroesophageal reflux disease) 05/01/2012  . Osteoarthrosis, unspecified whether generalized or localized, involving lower leg 05/01/2012  . Hyponatremia 07/01/2011    Past Surgical History:  Procedure Laterality Date  . CESAREAN SECTION  [id number]  . MYRINGOTOMY WITH TUBE PLACEMENT Bilateral   . TONSILLECTOMY      OB History    No data available       Home Medications    Prior to Admission medications   Medication Sig Start Date End Date Taking? Authorizing Provider  albuterol (PROVENTIL HFA;VENTOLIN HFA) 108 (90 Base) MCG/ACT inhaler Inhale 2 puffs into the lungs every 6 (six) hours as needed for wheezing or shortness of breath.   Yes [provider]  busPIRone (BUSPAR) 15 MG tablet TAKE 1 TABLET(15 MG) BY MOUTH TWICE DAILY Patient taking differently: Take 15 mg by mouth two times a day 04/19/16  Yes Palma HolterGunadasa, Kanishka G, MD  chlordiazePOXIDE (LIBRIUM) 25 MG capsule 50mg  PO TID x 1D, then 25-50mg  PO BID X 1D, then 25-50mg  PO QD X 1D Patient taking differently: 50 mg three times a day for 1 day then 25-50 mg two times a day for 1 day then 25-50 mg once a day for 1 day 12/09/16  Yes Azalia Bilisampos, Kevin, MD  FLUoxetine (PROZAC) 20 MG capsule Take 20 mg by mouth daily.  03/30/16  Yes [provider]  Fluticasone-Salmeterol (ADVAIR) 100-50 MCG/DOSE AEPB Inhale 1-2 puffs 2 (two) times daily into the lungs.  11/05/14  Yes [provider]  furosemide (LASIX) 40 MG tablet Take 1 tablet (40 mg total) by mouth daily. 06/17/16  Yes Midge MiniumWohl, Darren, MD  gabapentin (NEURONTIN) 100 MG capsule Take 2 capsules (200 mg total) by mouth 3 (three) times daily. Patient taking differently: Take 200 mg 2 (two) times daily as needed by mouth (for pain).  01/07/16  Yes Palma HolterGunadasa, Kanishka G, MD  lisdexamfetamine (VYVANSE) 70 MG capsule Take 70 mg  every morning by mouth.   Yes [provider]  meloxicam (MOBIC) 7.5 MG tablet Take 7.5 mg by mouth daily. 11/27/16  Yes [provider]  cholestyramine light (PREVALITE) 4 g packet Take 1 packet (4 g total) by mouth 3 (three) times daily. With meals Patient not taking: Reported on 12/13/2016 03/11/16   Palma HolterGunadasa, Kanishka G, MD  Melatonin 3 MG TABS Take 1 tablet (3 mg total) by mouth at bedtime as needed (sleep). 10/24/15   Garth Bignessimberlake, Kathryn, MD  modafinil (PROVIGIL) 200 MG tablet Take 200 mg by mouth daily. 08/04/16   [provider]  ondansetron (ZOFRAN ODT) 8 MG disintegrating tablet Take 1 tablet (8 mg total) by mouth every 8 (eight) hours as needed for nausea or vomiting. 12/09/16   Azalia Bilisampos, Kevin, MD  oxyCODONE 10 MG TABS Take 1 tablet (10 mg total) by mouth every 4 (four) hours as needed for severe pain (1 to 2 tabs as needed for pain). 09/11/16   Tat,  Onalee Hua, MD  pantoprazole (PROTONIX) 40 MG tablet Take 1 tablet (40 mg total) by mouth daily. 12/30/15   Haney, Jeanann Lewandowsky, MD  predniSONE (DELTASONE) 10 MG tablet Take 4 tablets (40 mg total) daily with breakfast for 4 days by mouth. 12/13/16 12/17/16  Michela Pitcher A, PA-C  promethazine (PHENERGAN) 25 MG tablet Take 25 mg by mouth every 6 (six) hours as needed for nausea or vomiting.    [provider]  rifaximin (XIFAXAN) 550 MG TABS tablet Take 1 tablet (550 mg total) by mouth 2 (two) times daily. 06/05/15   Catarina Hartshorn, MD  risperiDONE (RISPERDAL) 0.5 MG tablet Take 1 tablet (0.5 mg total) by mouth 3 (three) times daily as needed (agitation). Patient taking differently: Take 0.5 mg by mouth 2 (two) times daily.  02/06/16   Renne Musca, MD  rizatriptan (MAXALT) 10 MG tablet Take 1 tablet (10 mg total) by mouth as needed for migraine. May repeat in 2 hours if needed 12/06/16   Porfirio Oar, PA-C  spironolactone (ALDACTONE) 100 MG tablet Take 1 tablet (100 mg total) by mouth daily. 11/23/16   Derwood Kaplan, MD    topiramate (TOPAMAX) 100 MG tablet Take 1 tablet (100 mg total) by mouth daily. 01/07/16   [name], MD    Family History Family History  Problem Relation Age of Onset  . Arrhythmia Mother   . Heart disease Father   . Hypertension Father     Social History Social History   Tobacco Use  . Smoking status: Current Every Day Smoker    Packs/day: 1.00    Years: 20.00    Pack years: 20.00    Types: Cigarettes  . Smokeless tobacco: Never Used  . Tobacco comment: Currently Vaping Daily  Substance Use Topics  . Alcohol use: Yes    Alcohol/week: 12.6 oz    Types: 21 Glasses of wine per week  . Drug use: No     Allergies   Patient has no known allergies.   Review of Systems Review of Systems  Constitutional: Positive for chills. Negative for fever.  Respiratory: Positive for shortness of breath and wheezing. Negative for cough.   Cardiovascular: Negative for chest pain, palpitations and leg swelling.  Gastrointestinal: Positive for abdominal pain, diarrhea, nausea and vomiting. Negative for blood in stool and constipation.  Genitourinary: Negative for dysuria and hematuria.  Skin: Positive for wound.  All other systems reviewed and are negative.    Physical Exam Updated Vital Signs BP (!) 100/58   Pulse 90   Temp 98.2 F (36.8 C) (Oral)   Resp (!) 22   Ht 5\' 2"  (1.575 m)   Wt 68 kg (150 lb)   SpO2 97%   BMI 27.44 kg/m   Physical Exam  Constitutional: She appears well-developed and well-nourished. No distress.  HENT:  Head: Normocephalic and atraumatic.  Eyes: Conjunctivae are normal. Right eye exhibits no discharge. Left eye exhibits no discharge.  Neck: No JVD present. No tracheal deviation present.  Cardiovascular: Normal rate and normal heart sounds.  Pulmonary/Chest: Effort normal. Tachypnea noted. She has wheezes.  Diffuse expiratory wheezing in all lung fields, equal rise and fall of chest  Abdominal: She exhibits mass. She exhibits no  distension.  Soft warm umbilical hernia, reducible, with a 3 cm linear superficial skin abrasion.  Bleeding is controlled.  The area is erythematous.  Murphy sign absent, Rovsing's absent, no CVA tenderness.    Musculoskeletal: Normal range of motion. She exhibits no edema.  Neurological: She is alert.  Skin: Skin is warm and dry. No erythema.  Psychiatric: She has a normal mood and affect. Her behavior is normal.  Nursing note and vitals reviewed.    ED Treatments / Results  Labs (all labs ordered are listed, but only abnormal results are displayed) Labs Reviewed  BASIC METABOLIC PANEL - Abnormal; Notable for the following components:      Result Value   Potassium 3.3 (*)    BUN <5 (*)    Creatinine, Ser 0.40 (*)    Calcium 8.0 (*)    All other components within normal limits  CBC WITH DIFFERENTIAL/PLATELET - Abnormal; Notable for the following components:   RBC 3.73 (*)    Hemoglobin 11.0 (*)    HCT 35.4 (*)    All other components within normal limits    EKG  EKG Interpretation None       Radiology Dg Chest 2 View  Result Date: 12/13/2016 CLINICAL DATA:  Shortness of Breath EXAM: CHEST  2 VIEW COMPARISON:  Chest radiograph December 08, 2016 and chest CT December 09, 2016 FINDINGS: There is no edema or consolidation. The heart size and pulmonary vascularity are within normal limits. There is aortic atherosclerosis. No adenopathy. No evident bone lesions. IMPRESSION: No edema or consolidation.  There is aortic atherosclerosis. Aortic Atherosclerosis (ICD10-I70.0). Electronically Signed   By: Bretta Bang III M.D.   On: 12/13/2016 15:12    Procedures Procedures (including critical care time)  Medications Ordered in ED Medications  fentaNYL (SUBLIMAZE) injection 50 mcg (50 mcg Intravenous Given 12/13/16 1630)  sodium chloride 0.9 % bolus 500 mL (500 mLs Intravenous New Bag/Given 12/13/16 1626)  methylPREDNISolone sodium succinate (SOLU-MEDROL) 125 mg/2 mL injection 125  mg (125 mg Intravenous Given 12/13/16 1629)  ipratropium-albuterol (DUONEB) 0.5-2.5 (3) MG/3ML nebulizer solution 3 mL (3 mLs Nebulization Given 12/13/16 1633)     Initial Impression / Assessment and Plan / ED Course  I have reviewed the triage vital signs and the nursing notes.  Pertinent labs & imaging results that were available during my care of the patient were reviewed by me and considered in my medical decision making (see chart for details).     Patient presents with bleeding from umbilical hernia after scratching, as well as shortness of breath.  She was seen and evaluated for the same 5 days ago.  Afebrile, initially tachypneic and hypotensive with improvement while in the ED.  SPO2 dropped to 84% on room air with improvement while on 2 L nasal cannula.  Chest x-ray shows no acute cardiopulmonary abnormality such as pneumonia or CHF exacerbation.  She denies chest pain, I doubt ACS or MI.  Low suspicion of DVT or PE.  Significant improvement in wheezing after administration of Solu-Medrol and DuoNeb.  Ambulatory pulse ox with SPO2 greater than 90% on room air while ambulating. suspect COPD exacerbation.  Will discharge with prednisone pulse for 5 days.  Abdominal hernia is easily reducible, low suspicion of incarcerated hernia.  No evidence of abscess, cellulitis, or other superimposed infection.  Low suspicion of obstruction, perforation, or other acute surgical abdominal pathology.  She has a scratch which continues to bleed as she aggravates it.  Wound was cleaned and dressed.  Discussed wound care.  Discussed importance of follow-up with general surgery for reevaluation.  Lab work is reassuring with no leukocytosis or significant electrolyte abnormality.  On reevaluation, patient is resting comfortably, tolerating food and fluids orally without difficulty, and ambulatory without difficulty.  She  is requesting discharge home which I think is reasonable.  Discussed indications for return to  the ED.  She will follow-up with a primary care physician for reevaluation. Pt verbalized understanding of and agreement with plan and is safe for discharge home at this time.  Patient seen and evaluated by Dr. Adela Lank who agrees with assessment and plan at this time.  Final Clinical Impressions(s) / ED Diagnoses   Final diagnoses:  Umbilical hernia without obstruction and without gangrene  Shortness of breath    ED Discharge Orders        Ordered    predniSONE (DELTASONE) 10 MG tablet  Daily with breakfast     12/13/16 1815       Jeanie Sewer, PA-C 12/13/16 1830    Melene Plan, DO 12/14/16 [phone]

## 2016-12-13 NOTE — Discharge Instructions (Signed)
Take prednisone as prescribed beginning tomorrow.  You received the first dose of steroid while in the ED today.  Take home medications as prescribed.  Follow-up with a primary care physician for reevaluation of your symptoms.   Apply antibiotic ointment to the cut on your umbilical hernia.  Keep it clean and dry.Return to the ED for any concerning signs or symptoms develop.

## 2016-12-13 NOTE — Telephone Encounter (Signed)
Tried patient on both numbers unable to reach. Will need to be seen

## 2016-12-13 NOTE — ED Notes (Signed)
Pt ambulated to RR and maintained O2 of 90%

## 2016-12-13 NOTE — ED Notes (Signed)
Family at bedside. 

## 2016-12-13 NOTE — ED Triage Notes (Addendum)
Pt arrives EMS from home with c/o shob and bleeding at abdominal hernia after she picked at scab.. Pt had albuterol 5mg  x 2 and atrovent .5  In route to hospital. Hx of COPD. Pt states she hasd been drinking wine this am while calling [facility] centers for alcoholism treatment

## 2016-12-14 ENCOUNTER — Telehealth: Payer: Self-pay | Admitting: Physician Assistant

## 2016-12-14 NOTE — ED Provider Notes (Signed)
Medical screening examination/treatment/procedure(s) were conducted as a shared visit with non-physician practitioner(s) and myself.  I personally evaluated the patient during the encounter.   EKG Interpretation None        See the written copy of this report in the patient's paper medical record.  These results did not interface directly into the electronic medical record and are summarized here.  Hernia reduction Date/Time: 12/14/2016 9:04 AM Performed by: Melene Plan, DO Authorized by: Melene Plan, DO  Consent: Verbal consent obtained. Risks and benefits: risks, benefits and alternatives were discussed Consent given by: patient Patient understanding: patient states understanding of the procedure being performed Patient consent: the patient's understanding of the procedure matches consent given Local anesthesia used: no  Anesthesia: Local anesthesia used: no  Sedation: Patient sedated: no  Patient tolerance: Patient tolerated the procedure well with no immediate complications    56 yo F with a chief complaint of shortness of breath and bleeding from her hernia.  She has been scratching at her hernia off and on for many months.  She scratched it and started bleed this morning and wanted it evaluated.  On my exam she has an umbilical hernia that has some mild erythema.  It was easily reduced at bedside with direct pressure.  She has diffuse wheezes noted on exam.  Continues to smoke.  Had improvement with breathing treatments.  Discharge home.  Surgical follow-up.   Melene Plan, DO 12/14/16 [phone]

## 2016-12-14 NOTE — Telephone Encounter (Signed)
Copied from CRM [id number]. Topic: Quick Communication - See Telephone Encounter >> Dec 14, 2016  3:25 PM Arlyss Gandy, NT wrote: CRM for notification. See Telephone encounter for: Patient would like a referral to pain management. Please give pt a call.  12/14/16.

## 2016-12-21 ENCOUNTER — Encounter (HOSPITAL_COMMUNITY): Payer: Self-pay

## 2016-12-21 ENCOUNTER — Emergency Department (HOSPITAL_COMMUNITY): Payer: 59

## 2016-12-21 ENCOUNTER — Emergency Department (HOSPITAL_COMMUNITY)
Admission: EM | Admit: 2016-12-21 | Discharge: 2016-12-21 | Disposition: A | Discharge status: Home / Self Care | Payer: 59 | Attending: Emergency Medicine | Admitting: Emergency Medicine

## 2016-12-21 DIAGNOSIS — F17210 Nicotine dependence, cigarettes, uncomplicated: Secondary | ICD-10-CM | POA: Insufficient documentation

## 2016-12-21 DIAGNOSIS — R1084 Generalized abdominal pain: Secondary | ICD-10-CM | POA: Diagnosis present

## 2016-12-21 DIAGNOSIS — Z79899 Other long term (current) drug therapy: Secondary | ICD-10-CM | POA: Diagnosis not present

## 2016-12-21 DIAGNOSIS — K439 Ventral hernia without obstruction or gangrene: Secondary | ICD-10-CM | POA: Insufficient documentation

## 2016-12-21 DIAGNOSIS — R109 Unspecified abdominal pain: Secondary | ICD-10-CM

## 2016-12-21 LAB — CBC WITH DIFFERENTIAL/PLATELET
BASOS ABS: 0 10*3/uL (ref 0.0–0.1)
BASOS PCT: 1 %
EOS ABS: 0.2 10*3/uL (ref 0.0–0.7)
EOS PCT: 3 %
HCT: 37 % (ref 36.0–46.0)
Hemoglobin: 11.5 g/dL — ABNORMAL LOW (ref 12.0–15.0)
Lymphocytes Relative: 24 %
Lymphs Abs: 1.6 10*3/uL (ref 0.7–4.0)
MCH: 28.7 pg (ref 26.0–34.0)
MCHC: 31.1 g/dL (ref 30.0–36.0)
MCV: 92.3 fL (ref 78.0–100.0)
MONO ABS: 0.6 10*3/uL (ref 0.1–1.0)
Monocytes Relative: 9 %
Neutro Abs: 4.4 10*3/uL (ref 1.7–7.7)
Neutrophils Relative %: 63 %
PLATELETS: 187 10*3/uL (ref 150–400)
RBC: 4.01 MIL/uL (ref 3.87–5.11)
RDW: 14.8 % (ref 11.5–15.5)
WBC: 6.8 10*3/uL (ref 4.0–10.5)

## 2016-12-21 LAB — COMPREHENSIVE METABOLIC PANEL
ALBUMIN: 4.1 g/dL (ref 3.5–5.0)
ALK PHOS: 138 U/L — AB (ref 38–126)
ALT: 23 U/L (ref 14–54)
AST: 49 U/L — AB (ref 15–41)
Anion gap: 11 (ref 5–15)
BILIRUBIN TOTAL: 0.8 mg/dL (ref 0.3–1.2)
BUN: 9 mg/dL (ref 6–20)
CALCIUM: 8.4 mg/dL — AB (ref 8.9–10.3)
CO2: 26 mmol/L (ref 22–32)
Chloride: 104 mmol/L (ref 101–111)
Creatinine, Ser: 0.44 mg/dL (ref 0.44–1.00)
GFR calc Af Amer: >60 mL/min (ref >60)
GLUCOSE: 119 mg/dL — AB (ref 65–99)
Potassium: 3.1 mmol/L — ABNORMAL LOW (ref 3.5–5.1)
Sodium: 141 mmol/L (ref 135–145)
TOTAL PROTEIN: 7.4 g/dL (ref 6.5–8.1)

## 2016-12-21 LAB — BLOOD GAS, ARTERIAL
ACID-BASE DEFICIT: 1.1 mmol/L (ref 0.0–2.0)
BICARBONATE: 24.8 mmol/L (ref 20.0–28.0)
DRAWN BY: 422461
O2 Content: 2 L/min
O2 Saturation: 95.9 %
PATIENT TEMPERATURE: 98.6
PO2 ART: 106 mmHg (ref 83.0–108.0)
pCO2 arterial: 50.2 mmHg — ABNORMAL HIGH (ref 32.0–48.0)
pH, Arterial: 7.315 — ABNORMAL LOW (ref 7.350–7.450)

## 2016-12-21 LAB — LIPASE, BLOOD: LIPASE: 28 U/L (ref 11–51)

## 2016-12-21 LAB — ETHANOL: ALCOHOL ETHYL (B): 233 mg/dL — AB (ref <10)

## 2016-12-21 MED ORDER — IPRATROPIUM-ALBUTEROL 0.5-2.5 (3) MG/3ML IN SOLN
3.0000 mL | Freq: Once | RESPIRATORY_TRACT | Status: AC
  Administered 2016-12-21: 3 mL via RESPIRATORY_TRACT

## 2016-12-21 MED ORDER — IOPAMIDOL (ISOVUE-300) INJECTION 61%
INTRAVENOUS | Status: AC
  Administered 2016-12-21: 100 mL via INTRAVENOUS
  Filled 2016-12-21: qty 100

## 2016-12-21 MED ORDER — IPRATROPIUM-ALBUTEROL 0.5-2.5 (3) MG/3ML IN SOLN
3.0000 mL | Freq: Once | RESPIRATORY_TRACT | Status: DC
  Filled 2016-12-21: qty 3

## 2016-12-21 MED ORDER — ACETAMINOPHEN 500 MG PO TABS
1000.0000 mg | ORAL_TABLET | Freq: Once | ORAL | Status: AC
  Administered 2016-12-21: 1000 mg via ORAL
  Filled 2016-12-21: qty 2

## 2016-12-21 MED ORDER — ONDANSETRON HCL 4 MG/2ML IJ SOLN
4.0000 mg | Freq: Once | INTRAMUSCULAR | Status: AC
  Administered 2016-12-21: 4 mg via INTRAVENOUS
  Filled 2016-12-21: qty 2

## 2016-12-21 MED ORDER — IOPAMIDOL (ISOVUE-300) INJECTION 61%
100.0000 mL | Freq: Once | INTRAVENOUS | Status: AC | PRN
  Administered 2016-12-21: 100 mL via INTRAVENOUS

## 2016-12-21 MED ORDER — SODIUM CHLORIDE 0.9 % IV BOLUS (SEPSIS)
1000.0000 mL | Freq: Once | INTRAVENOUS | Status: AC
  Administered 2016-12-21: 1000 mL via INTRAVENOUS

## 2016-12-21 MED ORDER — PANTOPRAZOLE SODIUM 40 MG IV SOLR
40.0000 mg | Freq: Once | INTRAVENOUS | Status: AC
  Administered 2016-12-21: 40 mg via INTRAVENOUS
  Filled 2016-12-21: qty 40

## 2016-12-21 NOTE — ED Triage Notes (Signed)
Pt has cirrhosis and has had a bottle of wine and now complains of severe abd pain

## 2016-12-21 NOTE — ED Notes (Signed)
Respiratory called for nebs and ABG

## 2016-12-21 NOTE — ED Provider Notes (Addendum)
[facility]-EMERGENCY DEPT Provider Note   CSN: [id number] Arrival date & time: 12/21/16  0219     History   Chief Complaint Chief Complaint  Patient presents with  . Abdominal Pain    HPI Anne Black is a 56 y.o. female.  Patient is a 56 year old female with past medical history of chronic ventral hernia presenting for evaluation of abdominal pain.  This started earlier this evening.  She reports generalized abdominal pain that is severe.  She denies any vomiting, but does feel nauseated.   The history is provided by the patient.  Abdominal Pain   This is a recurrent problem. The current episode started 1 to 2 hours ago. The problem occurs constantly. The problem has been rapidly worsening. The pain is located in the generalized abdominal region. The quality of the pain is cramping. The pain is severe. Pertinent negatives include fever, melena and constipation. The symptoms are aggravated by deep breathing, certain positions, coughing and palpation. Nothing relieves the symptoms.    Past Medical History:  Diagnosis Date  . ADHD (attention deficit hyperactivity disorder)   . Anxiety   . Arthritis   . Ascites   . Bipolar disorder (HCC)   . Cirrhosis (HCC)   . Hypokalemia   . Migraine   . Multiple sclerosis (HCC)   . Osteoporosis     Patient Active Problem List   Diagnosis Date Noted  . Distal radius fracture, left 09/09/2016  . Thrombocytopenia (HCC) 09/09/2016  . Chest x-ray abnormality   . Umbilical hernia without obstruction and without gangrene 05/12/2016  . Itching 02/24/2016  . Ventral hernia without obstruction or gangrene 02/24/2016  . Palliative care encounter   . Ascites   . Tachypnea   . Typical atrial flutter (HCC)   . Hypomagnesemia   . Hypoxia   . Dehydration 01/22/2016  . Low back ache 12/30/2015  . Non-intractable vomiting with nausea   . Alcohol use disorder 12/15/2015  . Acute alcoholism (HCC)   . Abdominal  distension 11/04/2015  . Alcohol abuse   . Dyspnea   . Acute respiratory failure (HCC) 10/15/2015  . Ascites due to alcoholic cirrhosis (HCC) 10/15/2015  . Osteoporosis   . Bipolar I disorder (HCC)   . Multiple sclerosis (HCC)   . Alcoholic cirrhosis of liver without ascites (HCC) 08/22/2015  . Bipolar disorder, current episode mixed, moderate (HCC) 08/01/2015  . Alcohol use disorder, severe, dependence (HCC) 07/31/2015  . Macrocytic anemia- due to alcohol abuse with normal B12 & folate levels 05/31/2015  . Severe protein-calorie malnutrition (HCC) 05/31/2015  . Hypokalemia 05/26/2015  . Encephalopathy, hepatic (HCC) 05/26/2015  . Alcohol withdrawal (HCC) 05/18/2015  . Abdominal pain 05/18/2015  . Anxiety 05/18/2015  . UTI (lower urinary tract infection) 05/18/2015  . Stimulant abuse (HCC) 12/27/2014  . Nicotine abuse 12/27/2014  . Hyperprolactinemia (HCC) 11/15/2014  . Dyslipidemia   . Tobacco abuse 01/23/2014  . Noncompliance with therapeutic plan 04/04/2013  . Unspecified hereditary and idiopathic peripheral neuropathy 05/01/2012  . GERD (gastroesophageal reflux disease) 05/01/2012  . Osteoarthrosis, unspecified whether generalized or localized, involving lower leg 05/01/2012  . Hyponatremia 07/01/2011    Past Surgical History:  Procedure Laterality Date  . CESAREAN SECTION  [id number]  . MYRINGOTOMY WITH TUBE PLACEMENT Bilateral   . TONSILLECTOMY      OB History    No data available       Home Medications    Prior to Admission medications   Medication Sig Start Date  End Date Taking? Authorizing Provider  albuterol (PROVENTIL HFA;VENTOLIN HFA) 108 (90 Base) MCG/ACT inhaler Inhale 2 puffs into the lungs every 6 (six) hours as needed for wheezing or shortness of breath.    [provider]  busPIRone (BUSPAR) 15 MG tablet TAKE 1 TABLET(15 MG) BY MOUTH TWICE DAILY Patient taking differently: Take 15 mg by mouth two times a day 04/19/16   Palma HolterGunadasa, Kanishka G,  MD  chlordiazePOXIDE (LIBRIUM) 25 MG capsule 50mg  PO TID x 1D, then 25-50mg  PO BID X 1D, then 25-50mg  PO QD X 1D Patient taking differently: 50 mg three times a day for 1 day then 25-50 mg two times a day for 1 day then 25-50 mg once a day for 1 day 12/09/16   Azalia Bilisampos, Kevin, MD  cholestyramine light (PREVALITE) 4 g packet Take 1 packet (4 g total) by mouth 3 (three) times daily. With meals Patient not taking: Reported on 12/13/2016 03/11/16   Palma HolterGunadasa, Kanishka G, MD  FLUoxetine (PROZAC) 20 MG capsule Take 20 mg by mouth daily.  03/30/16   [provider]  Fluticasone-Salmeterol (ADVAIR) 100-50 MCG/DOSE AEPB Inhale 1-2 puffs 2 (two) times daily into the lungs.  11/05/14   [provider]  furosemide (LASIX) 40 MG tablet Take 1 tablet (40 mg total) by mouth daily. 06/17/16   Midge MiniumWohl, Darren, MD  gabapentin (NEURONTIN) 100 MG capsule Take 2 capsules (200 mg total) by mouth 3 (three) times daily. Patient taking differently: Take 200 mg 2 (two) times daily as needed by mouth (for pain).  01/07/16   Palma HolterGunadasa, Kanishka G, MD  lisdexamfetamine (VYVANSE) 70 MG capsule Take 70 mg every morning by mouth.    [provider]  Melatonin 3 MG TABS Take 1 tablet (3 mg total) by mouth at bedtime as needed (sleep). 10/24/15   Garth Bignessimberlake, Kathryn, MD  meloxicam (MOBIC) 7.5 MG tablet Take 7.5 mg by mouth daily. 11/27/16   [provider]  modafinil (PROVIGIL) 200 MG tablet Take 200 mg by mouth daily. 08/04/16   [provider]  ondansetron (ZOFRAN ODT) 8 MG disintegrating tablet Take 1 tablet (8 mg total) by mouth every 8 (eight) hours as needed for nausea or vomiting. 12/09/16   Azalia Bilisampos, Kevin, MD  oxyCODONE 10 MG TABS Take 1 tablet (10 mg total) by mouth every 4 (four) hours as needed for severe pain (1 to 2 tabs as needed for pain). 09/11/16   Catarina Hartshornat, David, MD  pantoprazole (PROTONIX) 40 MG tablet Take 1 tablet (40 mg total) by mouth daily. 12/30/15   Bonney AidHaney, Alyssa A, MD  promethazine  (PHENERGAN) 25 MG tablet Take 25 mg by mouth every 6 (six) hours as needed for nausea or vomiting.    [provider]  rifaximin (XIFAXAN) 550 MG TABS tablet Take 1 tablet (550 mg total) by mouth 2 (two) times daily. 06/05/15   Catarina Hartshornat, David, MD  risperiDONE (RISPERDAL) 0.5 MG tablet Take 1 tablet (0.5 mg total) by mouth 3 (three) times daily as needed (agitation). Patient taking differently: Take 0.5 mg by mouth 2 (two) times daily.  02/06/16   Renne MuscaWarden, Daniel L, MD  rizatriptan (MAXALT) 10 MG tablet Take 1 tablet (10 mg total) by mouth as needed for migraine. May repeat in 2 hours if needed 12/06/16   Porfirio OarJeffery, Chelle, PA-C  spironolactone (ALDACTONE) 100 MG tablet Take 1 tablet (100 mg total) by mouth daily. 11/23/16   Derwood KaplanNanavati, Ankit, MD  topiramate (TOPAMAX) 100 MG tablet Take 1 tablet (100 mg total) by  mouth daily. 01/07/16   [name], MD    Family History Family History  Problem Relation Age of Onset  . Arrhythmia Mother   . Heart disease Father   . Hypertension Father     Social History Social History   Tobacco Use  . Smoking status: Current Every Day Smoker    Packs/day: 1.00    Years: 20.00    Pack years: 20.00    Types: Cigarettes  . Smokeless tobacco: Never Used  . Tobacco comment: Currently Vaping Daily  Substance Use Topics  . Alcohol use: Yes    Alcohol/week: 12.6 oz    Types: 21 Glasses of wine per week  . Drug use: No     Allergies   Patient has no known allergies.   Review of Systems Review of Systems  Constitutional: Negative for fever.  Gastrointestinal: Positive for abdominal pain. Negative for constipation and melena.  All other systems reviewed and are negative.    Physical Exam Updated Vital Signs There were no vitals taken for this visit.  Physical Exam  Constitutional: She is oriented to person, place, and time. She appears well-developed and well-nourished. No distress.  HENT:  Head: Normocephalic and atraumatic.  Neck:  Normal range of motion. Neck supple.  Cardiovascular: Normal rate and regular rhythm. Exam reveals no gallop and no friction rub.  No murmur heard. Pulmonary/Chest: Effort normal and breath sounds normal. No respiratory distress. She has no wheezes.  Abdominal: Soft. Bowel sounds are normal. She exhibits no distension. There is no tenderness. There is no rigidity, no rebound and no guarding.  There is a large ventral hernia noted.  The patient has tenderness with palpation of any part of her abdomen.  Musculoskeletal: Normal range of motion.  Neurological: She is alert and oriented to person, place, and time.  Skin: Skin is warm and dry. She is not diaphoretic.  Nursing note and vitals reviewed.    ED Treatments / Results  Labs (all labs ordered are listed, but only abnormal results are displayed) Labs Reviewed  COMPREHENSIVE METABOLIC PANEL  CBC WITH DIFFERENTIAL/PLATELET  LIPASE, BLOOD  ETHANOL    EKG  EKG Interpretation None       Radiology No results found.  Procedures Procedures (including critical care time)  Medications Ordered in ED Medications  sodium chloride 0.9 % bolus 1,000 mL (not administered)  ondansetron (ZOFRAN) injection 4 mg (not administered)  pantoprazole (PROTONIX) injection 40 mg (not administered)     Initial Impression / Assessment and Plan / ED Course  I have reviewed the triage vital signs and the nursing notes.  Pertinent labs & imaging results that were available during my care of the patient were reviewed by me and considered in my medical decision making (see chart for details).  Patient is a 56 year old female with history of chronic alcoholism, cirrhosis, and ventral hernia presenting with complaints of abdominal pain.  She reports drinking a bottle of wine this evening, then started with severe abdominal pain.  She does have a large ventral hernia, however this was easily reducible.  CT scan shows no acute intra-abdominal process.   She was given Protonix and will be discharged.  To follow-up with primary doctor.  When patient was being discharged, she was found to have low oxygen saturations.  She was laying flat on her back and was very somnolent.  An ABG was obtained which reveals no significant CO2 retention as an explanation for her somnolence.  Her blood alcohol was 233  and I suspect this was contributing to both.  She was given 2 nebulizer treatments and is now more awake and alert with oxygen saturations of 93%.  I suspect this may be her baseline as she has a history of heavy cigarette use.   The chest x-ray reveals possible infiltrate in the left base, however this does not fit the clinical picture.  She has no white count, no fever, no cough, and there was no evidence for infiltrate on the CT scan.  The original plan for discharged will be carried out.  ADDENDUM:  The patient inquired to the nurse about the possibility of "going into a facility".  She apparently does not want to go home and wants to go somewhere where someone can take care of her.  I explained to her that there was no medical reason for her to be admitted to the hospital, nor is there a medical reason for her to go to a nursing home.  She tells me that she is a "fall risk" due to her alcoholism and history of multiple sclerosis. And also says that she "does not take good care of herself".  She tells me that she was hospitalized in the past, then was sent to [facility] place and would like to go back there.  I offered her to speak with the social worker to discuss home health, however she declined this.  At this point, I see no indication for medical admission, and no indication for emergent nursing home placement.  She is ambulatory and has no problem getting from her house to the liquor store to buy her alcohol.  It was also noted by EMS that this patient ambulated without difficulty or assistance from the house into the ambulance prior to her arrival  here.  She is ambulatory in the emergency department now without assistance.    Final Clinical Impressions(s) / ED Diagnoses   Final diagnoses:  None    ED Discharge Orders    None       Geoffery Lyons, MD 12/21/16 1610    Geoffery Lyons, MD 12/21/16 [id number]    Geoffery Lyons, MD 12/21/16 [id number]    Geoffery Lyons, MD 12/21/16 8119    Geoffery Lyons, MD 12/21/16 [phone]

## 2016-12-21 NOTE — Discharge Instructions (Signed)
Follow-up with your primary doctor to discuss referral to general surgery for evaluation of your hernia.  Return to the ER if symptoms significantly worsen or change.

## 2016-12-24 ENCOUNTER — Ambulatory Visit: Payer: Medicare Other | Admitting: Internal Medicine

## 2016-12-24 ENCOUNTER — Other Ambulatory Visit: Payer: Self-pay | Admitting: Family Medicine

## 2016-12-24 ENCOUNTER — Telehealth: Payer: Self-pay | Admitting: Family Medicine

## 2016-12-24 MED ORDER — ONDANSETRON HCL 4 MG PO TABS: 4.0000 mg | ORAL_TABLET | Freq: Three times a day (TID) | ORAL | 0 refills | Status: DC | PRN

## 2016-12-24 NOTE — Telephone Encounter (Signed)
error 

## 2016-12-24 NOTE — Telephone Encounter (Signed)
**  After Hours/ Emergency Line Call*  Received a call to report that [name] ate some food last night and has been having persistent nausea with some intermittent vomiting over the past 15 hours or so.  She has been able to keep down small amounts of liquids and denies any blood or bile in her vomit.  She says that she is worried about the pain and discomfort and when I offered to call in some Zofran for her she stated that she does not feel comfortable driving.  I said if she is concerned enough she can come to the emergency department and I sent in a prescription for Zofran 4 mg every 8 as needed.  Patient then called again about 5 minutes later stating that she thinks she needs to come into the hospital because she may hurt herself or hurt others.  I stated that she should call 911 and come to the emergency department to be evaluated.  She then asked me if I could directly admit her.  I said that I do not think this is possible and then asked what would happen if she called tomorrow or Monday.  I ended the conversation by highly recommending that she come to the emergency department to be evaluated.  Red flags were discussed regarding her abdominal pain, nausea and vomiting.  Per care team's updated 12/08/2016 patient has no PCP.  Of note, patient did have appointment scheduled today with Dr. Cathlean Cower.  Will forward note to her.   Renne Musca, MD PGY-2, [facility]

## 2016-12-25 ENCOUNTER — Encounter (HOSPITAL_COMMUNITY): Payer: Self-pay | Admitting: Nurse Practitioner

## 2016-12-25 ENCOUNTER — Telehealth: Payer: Self-pay | Admitting: Internal Medicine

## 2016-12-25 ENCOUNTER — Emergency Department (HOSPITAL_COMMUNITY)
Admission: EM | Admit: 2016-12-25 | Discharge: 2016-12-26 | Disposition: A | Discharge status: Home / Self Care | Payer: 59 | Attending: Emergency Medicine | Admitting: Emergency Medicine

## 2016-12-25 DIAGNOSIS — N390 Urinary tract infection, site not specified: Secondary | ICD-10-CM | POA: Diagnosis not present

## 2016-12-25 DIAGNOSIS — F319 Bipolar disorder, unspecified: Secondary | ICD-10-CM | POA: Insufficient documentation

## 2016-12-25 DIAGNOSIS — R1084 Generalized abdominal pain: Secondary | ICD-10-CM | POA: Diagnosis present

## 2016-12-25 DIAGNOSIS — F17210 Nicotine dependence, cigarettes, uncomplicated: Secondary | ICD-10-CM | POA: Diagnosis not present

## 2016-12-25 DIAGNOSIS — L03311 Cellulitis of abdominal wall: Secondary | ICD-10-CM | POA: Diagnosis not present

## 2016-12-25 DIAGNOSIS — F1024 Alcohol dependence with alcohol-induced mood disorder: Secondary | ICD-10-CM | POA: Diagnosis not present

## 2016-12-25 DIAGNOSIS — F1014 Alcohol abuse with alcohol-induced mood disorder: Secondary | ICD-10-CM | POA: Diagnosis present

## 2016-12-25 DIAGNOSIS — F1010 Alcohol abuse, uncomplicated: Secondary | ICD-10-CM

## 2016-12-25 DIAGNOSIS — Z79899 Other long term (current) drug therapy: Secondary | ICD-10-CM | POA: Insufficient documentation

## 2016-12-25 DIAGNOSIS — F1020 Alcohol dependence, uncomplicated: Secondary | ICD-10-CM

## 2016-12-25 LAB — CBC
HCT: 38 % (ref 36.0–46.0)
Hemoglobin: 11.7 g/dL — ABNORMAL LOW (ref 12.0–15.0)
MCH: 28.4 pg (ref 26.0–34.0)
MCHC: 30.8 g/dL (ref 30.0–36.0)
MCV: 92.2 fL (ref 78.0–100.0)
PLATELETS: 185 10*3/uL (ref 150–400)
RBC: 4.12 MIL/uL (ref 3.87–5.11)
RDW: 15 % (ref 11.5–15.5)
WBC: 9.4 10*3/uL (ref 4.0–10.5)

## 2016-12-25 LAB — URINALYSIS, ROUTINE W REFLEX MICROSCOPIC
BILIRUBIN URINE: NEGATIVE
GLUCOSE, UA: NEGATIVE mg/dL
Hgb urine dipstick: NEGATIVE
KETONES UR: NEGATIVE mg/dL
Nitrite: POSITIVE — AB
PH: 6 (ref 5.0–8.0)
PROTEIN: NEGATIVE mg/dL
Specific Gravity, Urine: 1.019 (ref 1.005–1.030)

## 2016-12-25 LAB — COMPREHENSIVE METABOLIC PANEL
ALT: 25 U/L (ref 14–54)
ANION GAP: 9 (ref 5–15)
AST: 49 U/L — ABNORMAL HIGH (ref 15–41)
Albumin: 3.9 g/dL (ref 3.5–5.0)
Alkaline Phosphatase: 137 U/L — ABNORMAL HIGH (ref 38–126)
BUN: 7 mg/dL (ref 6–20)
CHLORIDE: 106 mmol/L (ref 101–111)
CO2: 22 mmol/L (ref 22–32)
CREATININE: 0.44 mg/dL (ref 0.44–1.00)
Calcium: 8.6 mg/dL — ABNORMAL LOW (ref 8.9–10.3)
Glucose, Bld: 97 mg/dL (ref 65–99)
Potassium: 3.9 mmol/L (ref 3.5–5.1)
SODIUM: 137 mmol/L (ref 135–145)
Total Bilirubin: 0.8 mg/dL (ref 0.3–1.2)
Total Protein: 7.6 g/dL (ref 6.5–8.1)

## 2016-12-25 LAB — RAPID URINE DRUG SCREEN, HOSP PERFORMED
AMPHETAMINES: NOT DETECTED
BARBITURATES: NOT DETECTED
BENZODIAZEPINES: POSITIVE — AB
Cocaine: NOT DETECTED
Opiates: NOT DETECTED
Tetrahydrocannabinol: NOT DETECTED

## 2016-12-25 LAB — I-STAT BETA HCG BLOOD, ED (MC, WL, AP ONLY)

## 2016-12-25 LAB — LIPASE, BLOOD: LIPASE: 31 U/L (ref 11–51)

## 2016-12-25 LAB — PROTIME-INR
INR: 1.11
Prothrombin Time: 14.2 seconds (ref 11.4–15.2)

## 2016-12-25 LAB — ETHANOL: Alcohol, Ethyl (B): 131 mg/dL — ABNORMAL HIGH (ref <10)

## 2016-12-25 MED ORDER — ONDANSETRON 4 MG PO TBDP
4.0000 mg | ORAL_TABLET | Freq: Once | ORAL | Status: AC
  Administered 2016-12-25: 4 mg via ORAL
  Filled 2016-12-25: qty 1

## 2016-12-25 MED ORDER — ALUM & MAG HYDROXIDE-SIMETH 200-200-20 MG/5ML PO SUSP
30.0000 mL | Freq: Four times a day (QID) | ORAL | Status: DC | PRN
  Administered 2016-12-25: 30 mL via ORAL
  Filled 2016-12-25: qty 30

## 2016-12-25 MED ORDER — CLINDAMYCIN HCL 300 MG PO CAPS
300.0000 mg | ORAL_CAPSULE | Freq: Four times a day (QID) | ORAL | Status: DC
  Administered 2016-12-25: 300 mg via ORAL
  Filled 2016-12-25 (×3): qty 1

## 2016-12-25 MED ORDER — LORAZEPAM 1 MG PO TABS: 0.0000 mg | ORAL_TABLET | Freq: Two times a day (BID) | ORAL | Status: DC

## 2016-12-25 MED ORDER — THIAMINE HCL 100 MG/ML IJ SOLN: 100.0000 mg | Freq: Every day | INTRAMUSCULAR | Status: DC

## 2016-12-25 MED ORDER — LORAZEPAM 2 MG/ML IJ SOLN: 0.0000 mg | Freq: Two times a day (BID) | INTRAMUSCULAR | Status: DC

## 2016-12-25 MED ORDER — SENNA 8.6 MG PO TABS
2.0000 | ORAL_TABLET | Freq: Every day | ORAL | Status: DC
  Administered 2016-12-25 – 2016-12-26 (×2): 17.2 mg via ORAL
  Filled 2016-12-25 (×2): qty 2

## 2016-12-25 MED ORDER — LORAZEPAM 2 MG/ML IJ SOLN
0.0000 mg | Freq: Four times a day (QID) | INTRAMUSCULAR | Status: DC
  Administered 2016-12-25: 2 mg via INTRAVENOUS
  Filled 2016-12-25: qty 1

## 2016-12-25 MED ORDER — LORAZEPAM 1 MG PO TABS
0.0000 mg | ORAL_TABLET | Freq: Four times a day (QID) | ORAL | Status: DC
  Administered 2016-12-26: 2 mg via ORAL
  Filled 2016-12-25: qty 2

## 2016-12-25 MED ORDER — VITAMIN B-1 100 MG PO TABS
100.0000 mg | ORAL_TABLET | Freq: Every day | ORAL | Status: DC
Start: 2016-12-25 — End: 2016-12-26
  Administered 2016-12-25 – 2016-12-26 (×2): 100 mg via ORAL
  Filled 2016-12-25 (×2): qty 1

## 2016-12-25 MED ORDER — TRAZODONE HCL 50 MG PO TABS
50.0000 mg | ORAL_TABLET | Freq: Once | ORAL | Status: AC
  Administered 2016-12-25: 50 mg via ORAL
  Filled 2016-12-25: qty 1

## 2016-12-25 MED ORDER — ONDANSETRON HCL 4 MG PO TABS
4.0000 mg | ORAL_TABLET | Freq: Three times a day (TID) | ORAL | Status: DC | PRN
  Administered 2016-12-25 – 2016-12-26 (×2): 4 mg via ORAL
  Filled 2016-12-25 (×3): qty 1

## 2016-12-25 NOTE — ED Notes (Signed)
Hourly rounding reveals patient in room. Stable, in no acute distress. Q15 minute rounds and monitoring via Security Cameras to continue. 

## 2016-12-25 NOTE — ED Notes (Signed)
Pt. Transferred to [facility] from ED to room 37 after screening for contraband. Report to include Situation, Background, Assessment and Recommendations from Oscar RN. Pt. Oriented to unit including Q15 minute rounds as well as the security cameras for their protection. Patient is alert and oriented, warm and dry in no acute distress. Patient denies SI, HI, and AVH. Pt. Encouraged to let me know if needs arise. 

## 2016-12-25 NOTE — ED Notes (Signed)
ED Provider at bedside. 

## 2016-12-25 NOTE — ED Provider Notes (Signed)
[facility]-EMERGENCY DEPT Provider Note   CSN: [id number] Arrival date & time: 12/25/16  1731     History   Chief Complaint Chief Complaint  Patient presents with  . Addiction Problem    HPI Anne Black is a 56 y.o. female.  56yo F w/ PMH including alcohol abuse, cirrhosis, bipolar d/o, MS who p/w multiple complaints.  The patient was brought in by EMS with complaints of severe generalized abdominal pain associated with nausea and vomiting.  She states she thinks she had food poisoning because she ate [employer] pizza and it began afterwards.  She does admit to drinking 3 glasses of wine today.  She admits to having problems with alcohol abuse and has been looking into a detox facility to go to.  She endorses constipation and difficulty with urination.  She has problems with depression and feeling hopeless.  She states she feels like she does not want to live anymore.  She denies any HI or hallucinations.  Mother notes that patient has told her this week she does not want to live anymore. Mom is very concerned about her mental health problems and alcoholism.   LEVEL 5 CAVEAT DUE TO PSYCHIATRIC ILLNESS   The history is provided by the patient and a relative.    Past Medical History:  Diagnosis Date  . ADHD (attention deficit hyperactivity disorder)   . Anxiety   . Arthritis   . Ascites   . Bipolar disorder (HCC)   . Cirrhosis (HCC)   . Hypokalemia   . Migraine   . Multiple sclerosis (HCC)   . Osteoporosis     Patient Active Problem List   Diagnosis Date Noted  . Distal radius fracture, left 09/09/2016  . Thrombocytopenia (HCC) 09/09/2016  . Chest x-ray abnormality   . Umbilical hernia without obstruction and without gangrene 05/12/2016  . Itching 02/24/2016  . Ventral hernia without obstruction or gangrene 02/24/2016  . Palliative care encounter   . Ascites   . Tachypnea   . Typical atrial flutter (HCC)   . Hypomagnesemia   . Hypoxia   .  Dehydration 01/22/2016  . Low back ache 12/30/2015  . Non-intractable vomiting with nausea   . Alcohol use disorder 12/15/2015  . Acute alcoholism (HCC)   . Abdominal distension 11/04/2015  . Alcohol abuse   . Dyspnea   . Acute respiratory failure (HCC) 10/15/2015  . Ascites due to alcoholic cirrhosis (HCC) 10/15/2015  . Osteoporosis   . Bipolar I disorder (HCC)   . Multiple sclerosis (HCC)   . Alcoholic cirrhosis of liver without ascites (HCC) 08/22/2015  . Bipolar disorder, current episode mixed, moderate (HCC) 08/01/2015  . Alcohol use disorder, severe, dependence (HCC) 07/31/2015  . Macrocytic anemia- due to alcohol abuse with normal B12 & folate levels 05/31/2015  . Severe protein-calorie malnutrition (HCC) 05/31/2015  . Hypokalemia 05/26/2015  . Encephalopathy, hepatic (HCC) 05/26/2015  . Alcohol withdrawal (HCC) 05/18/2015  . Abdominal pain 05/18/2015  . Anxiety 05/18/2015  . UTI (lower urinary tract infection) 05/18/2015  . Stimulant abuse (HCC) 12/27/2014  . Nicotine abuse 12/27/2014  . Hyperprolactinemia (HCC) 11/15/2014  . Dyslipidemia   . Tobacco abuse 01/23/2014  . Noncompliance with therapeutic plan 04/04/2013  . Unspecified hereditary and idiopathic peripheral neuropathy 05/01/2012  . GERD (gastroesophageal reflux disease) 05/01/2012  . Osteoarthrosis, unspecified whether generalized or localized, involving lower leg 05/01/2012  . Hyponatremia 07/01/2011    Past Surgical History:  Procedure Laterality Date  . CESAREAN SECTION  1610,9604  . MYRINGOTOMY WITH TUBE PLACEMENT Bilateral   . OPEN REDUCTION INTERNAL FIXATION (ORIF) LEFT WRIST FRACTURE, LEFT CARPAL TUNNEL RELEASE Left 09/09/2016   Performed by Dominica Severin, MD at [facility] ORS  . TONSILLECTOMY      OB History    No data available       Home Medications    Prior to Admission medications   Medication Sig Start Date End Date Taking? Authorizing Provider  albuterol (PROVENTIL HFA;VENTOLIN HFA) 108  (90 Base) MCG/ACT inhaler Inhale 2 puffs into the lungs every 6 (six) hours as needed for wheezing or shortness of breath.   Yes [provider]  busPIRone (BUSPAR) 15 MG tablet TAKE 1 TABLET(15 MG) BY MOUTH TWICE DAILY Patient taking differently: Take 15 mg by mouth two times a day 04/19/16  Yes [name], MD  FLUoxetine (PROZAC) 20 MG capsule Take 20 mg by mouth daily.  03/30/16  Yes [provider]  Fluticasone-Salmeterol (ADVAIR) 100-50 MCG/DOSE AEPB Inhale 1-2 puffs 2 (two) times daily into the lungs.  11/05/14  Yes [provider]  furosemide (LASIX) 40 MG tablet Take 1 tablet (40 mg total) by mouth daily. 06/17/16  Yes Midge Minium, MD  gabapentin (NEURONTIN) 100 MG capsule Take 2 capsules (200 mg total) by mouth 3 (three) times daily. Patient taking differently: Take 200 mg 2 (two) times daily as needed by mouth (for pain).  01/07/16  Yes [name], MD  lisdexamfetamine (VYVANSE) 70 MG capsule Take 70 mg every morning by mouth.   Yes [provider]  Melatonin 3 MG TABS Take 1 tablet (3 mg total) by mouth at bedtime as needed (sleep). 10/24/15  Yes Garth Bigness, MD  meloxicam (MOBIC) 7.5 MG tablet Take 7.5 mg by mouth daily. 11/27/16  Yes [provider]  modafinil (PROVIGIL) 200 MG tablet Take 200 mg by mouth daily. 08/04/16  Yes [provider]  pantoprazole (PROTONIX) 40 MG tablet Take 1 tablet (40 mg total) by mouth daily. 12/30/15  Yes Haney, Alyssa A, MD  promethazine (PHENERGAN) 25 MG tablet Take 25 mg by mouth every 6 (six) hours as needed for nausea or vomiting.   Yes [provider]  rifaximin (XIFAXAN) 550 MG TABS tablet Take 1 tablet (550 mg total) by mouth 2 (two) times daily. 06/05/15  Yes Tat, Onalee Hua, MD  risperiDONE (RISPERDAL) 0.5 MG tablet Take 1 tablet (0.5 mg total) by mouth 3 (three) times daily as needed (agitation). Patient taking differently: Take 0.5 mg by mouth 2 (two) times daily.   02/06/16  Yes Renne Musca, MD  rizatriptan (MAXALT) 10 MG tablet Take 1 tablet (10 mg total) by mouth as needed for migraine. May repeat in 2 hours if needed 12/06/16  Yes Jeffery, Chelle, PA-C  spironolactone (ALDACTONE) 100 MG tablet Take 1 tablet (100 mg total) by mouth daily. 11/23/16  Yes Derwood Kaplan, MD  topiramate (TOPAMAX) 100 MG tablet Take 1 tablet (100 mg total) by mouth daily. 01/07/16  Yes [name], MD  ondansetron (ZOFRAN ODT) 8 MG disintegrating tablet Take 1 tablet (8 mg total) by mouth every 8 (eight) hours as needed for nausea or vomiting. Patient not taking: Reported on 12/25/2016 12/09/16   Azalia Bilis, MD  ondansetron (ZOFRAN) 4 MG tablet Take 1 tablet (4 mg total) every 8 (eight) hours as needed by mouth for nausea or vomiting. 12/24/16   Renne Musca, MD    Family History Family History  Problem Relation Age of  Onset  . Arrhythmia Mother   . Heart disease Father   . Hypertension Father     Social History Social History   Tobacco Use  . Smoking status: Current Every Day Smoker    Packs/day: 1.00    Years: 20.00    Pack years: 20.00    Types: Cigarettes  . Smokeless tobacco: Never Used  . Tobacco comment: Currently Vaping Daily  Substance Use Topics  . Alcohol use: Yes    Alcohol/week: 12.6 oz    Types: 21 Glasses of wine per week  . Drug use: No     Allergies   Patient has no known allergies.   Review of Systems Review of Systems  Unable to perform ROS: Psychiatric disorder     Physical Exam Updated Vital Signs BP 134/73 (BP Location: Left Arm)   Pulse 93   Temp 98.7 F (37.1 C) (Oral)   Resp 16   SpO2 94%   Physical Exam  Constitutional: She is oriented to person, place, and time. She appears well-developed and well-nourished.  Pacing room, appears older than stated age  HENT:  Head: Normocephalic and atraumatic.  Moist mucous membranes  Eyes: Conjunctivae are normal.  Neck: Neck supple.  Cardiovascular:  Regular rhythm and normal heart sounds. Tachycardia present.  No murmur heard. Pulmonary/Chest: Effort normal and breath sounds normal.  Abdominal: Soft. Bowel sounds are normal. She exhibits mass. She exhibits no distension. There is no tenderness.  Large ventral hernia, no focal tenderness; skin overlying hernia has ulceration with scant drainage, mild erythema and warmth  Musculoskeletal: She exhibits no edema.  Neurological: She is alert and oriented to person, place, and time.  Fluent speech Normal gait  Skin: Skin is warm and dry.  Multiple excoriations and scabs on arms  Psychiatric:  Disheveled, walking around room and ER hallway with no pants or underwear; slightly pressured speech; good eye contact; tearful at times  Nursing note and vitals reviewed.    ED Treatments / Results  Labs (all labs ordered are listed, but only abnormal results are displayed) Labs Reviewed  COMPREHENSIVE METABOLIC PANEL - Abnormal; Notable for the following components:      Result Value   Calcium 8.6 (*)    AST 49 (*)    Alkaline Phosphatase 137 (*)    All other components within normal limits  CBC - Abnormal; Notable for the following components:   Hemoglobin 11.7 (*)    All other components within normal limits  URINALYSIS, ROUTINE W REFLEX MICROSCOPIC - Abnormal; Notable for the following components:   Color, Urine AMBER (*)    APPearance HAZY (*)    Nitrite POSITIVE (*)    Leukocytes, UA TRACE (*)    Bacteria, UA RARE (*)    Squamous Epithelial / LPF 0-5 (*)    All other components within normal limits  ETHANOL - Abnormal; Notable for the following components:   Alcohol, Ethyl (B) 131 (*)    All other components within normal limits  RAPID URINE DRUG SCREEN, HOSP PERFORMED - Abnormal; Notable for the following components:   Benzodiazepines POSITIVE (*)    All other components within normal limits  LIPASE, BLOOD  PROTIME-INR  I-STAT BETA HCG BLOOD, ED ([facility], [facility], [facility] ONLY)    EKG   EKG Interpretation None       Radiology No results found.  Procedures Procedures (including critical care time)  Medications Ordered in ED Medications  senna (SENOKOT) tablet 17.2 mg (17.2 mg Oral Given 12/25/16 1959)  clindamycin (CLEOCIN) capsule 300 mg (300 mg Oral Refused 12/26/16 0005)  LORazepam (ATIVAN) injection 0-4 mg (2 mg Intravenous Given 12/25/16 1959)    Or  LORazepam (ATIVAN) tablet 0-4 mg ( Oral See Alternative 12/25/16 1959)  LORazepam (ATIVAN) injection 0-4 mg (not administered)    Or  LORazepam (ATIVAN) tablet 0-4 mg (not administered)  thiamine (VITAMIN B-1) tablet 100 mg (100 mg Oral Given 12/25/16 1959)    Or  thiamine (B-1) injection 100 mg ( Intravenous See Alternative 12/25/16 1959)  alum & mag hydroxide-simeth (MAALOX/MYLANTA) 200-200-20 MG/5ML suspension 30 mL (30 mLs Oral Given 12/25/16 2219)  ondansetron (ZOFRAN) tablet 4 mg (4 mg Oral Given 12/25/16 2219)  ondansetron (ZOFRAN-ODT) disintegrating tablet 4 mg (4 mg Oral Given 12/25/16 1826)  traZODone (DESYREL) tablet 50 mg (50 mg Oral Given 12/25/16 2315)     Initial Impression / Assessment and Plan / ED Course  I have reviewed the triage vital signs and the nursing notes.  Pertinent labs that were available during my care of the patient were reviewed by me and considered in my medical decision making (see chart for details).    Longstanding h/o cirrhosis 2/2 alcoholism and bipolar d/o, here 3 days ago with N/V/abdominal pain and CT abd/pelvis was negative for acute findings.  Her vital signs were stable and she was walking around the room without problems.  She mentioned some low back pain later in conversation but has no extremity weakness or difficulty with ambulation.  She has multiple excoriations from skin picking, is disheveled, and admits to severe depression therefore I contacted TTS for evaluation.  Regarding her abdominal pain and vomiting, she admits to continuing to drink which I feel is  likely contributing to her symptoms.  Her labs today are overall reassuring with normal creatinine, normal total bili, normal WBC, hemoglobin 11.7. BAL 131. UDS + benzos.  UA is c/w UTI with nitrite positive, large WBCs. I have added urine culture. I initially gave clindamycin to cover for superficial cellulitis on hernia where she has picked at it, however after UA results I have switched to bactrim and keflex to cover both skin infection and UTI. She will need to complete 7 day course of each.   She requested to drink water and tolerated without problems immediately upon my first evaluation, therefore I do not feel she needs any further workup of her initial chief complaints.  However, I am very concerned about her depression and psychiatric symptoms on exam.  Psychiatry has recommended overnight hold for reevaluation in the morning and I agree.  I have completed IVC papers due to concerns for personal safety. Pt will be reassessed in the morning to determine disposition.  Final Clinical Impressions(s) / ED Diagnoses   Final diagnoses:  None    ED Discharge Orders    None       Little, Ambrose Finland, MD 12/26/16 [phone]

## 2016-12-25 NOTE — Telephone Encounter (Signed)
Received page to emergency after hours line. Patient says she wants to be admitted and that she wants a solumedrol dose pack. She could not give specifics about why she thought she needed to be admitted. Advised her that if she was concerned she should be evaluated in the emergency room. [facility] per telephone note 08/12/16. Advised patient I could not prescribe medications without assessing her. Offered that she could call to possibly re-establish with us on Monday.  Dani GobbleHillary Aaliyah Gavel, MD [facility], PGY-3

## 2016-12-25 NOTE — ED Notes (Signed)
Pt. C/o insomnia. 

## 2016-12-25 NOTE — ED Triage Notes (Signed)
Pt is c/o abdominal pain, N/V/C. States she has had 3 glasses of wine today.

## 2016-12-25 NOTE — ED Notes (Signed)
Pt. C/o abdominal discomfort and nausea.
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
## 2016-12-25 NOTE — BH Assessment (Signed)
[facility] Assessment Progress Note   Per Nira Conn, NP pt is to be monitored overnight and re-evaluated by psych in the AM due to manic behaviors observed in the ED. EDP Little, Ambrose Finland, MD in agreement with disposition. Pt's nurse Jari Favre, RN has been notified of disposition.   Princess Bruins, MSW, LCSW Therapeutic Triage Specialist  [phone]

## 2016-12-26 MED ORDER — LORAZEPAM 1 MG PO TABS: 1.0000 mg | ORAL_TABLET | Freq: Two times a day (BID) | ORAL | Status: DC

## 2016-12-26 MED ORDER — ONDANSETRON 4 MG PO TBDP: 4.0000 mg | ORAL_TABLET | Freq: Four times a day (QID) | ORAL | Status: DC | PRN

## 2016-12-26 MED ORDER — SULFAMETHOXAZOLE-TRIMETHOPRIM 800-160 MG PO TABS
1.0000 | ORAL_TABLET | Freq: Two times a day (BID) | ORAL | Status: DC
  Administered 2016-12-26 (×2): 1 via ORAL
  Filled 2016-12-26 (×2): qty 1

## 2016-12-26 MED ORDER — CEPHALEXIN 500 MG PO CAPS
500.0000 mg | ORAL_CAPSULE | Freq: Two times a day (BID) | ORAL | Status: DC
  Administered 2016-12-26 (×2): 500 mg via ORAL
  Filled 2016-12-26 (×2): qty 1

## 2016-12-26 MED ORDER — GABAPENTIN 400 MG PO CAPS
400.0000 mg | ORAL_CAPSULE | Freq: Three times a day (TID) | ORAL | Status: DC
  Administered 2016-12-26: 400 mg via ORAL
  Filled 2016-12-26: qty 1

## 2016-12-26 MED ORDER — LORAZEPAM 1 MG PO TABS: 1.0000 mg | ORAL_TABLET | Freq: Three times a day (TID) | ORAL | Status: DC

## 2016-12-26 MED ORDER — LORAZEPAM 1 MG PO TABS
1.0000 mg | ORAL_TABLET | Freq: Four times a day (QID) | ORAL | Status: DC
  Administered 2016-12-26: 1 mg via ORAL
  Filled 2016-12-26: qty 1

## 2016-12-26 MED ORDER — LORAZEPAM 1 MG PO TABS: 1.0000 mg | ORAL_TABLET | Freq: Every day | ORAL | Status: DC

## 2016-12-26 MED ORDER — ONDANSETRON HCL 4 MG PO TABS: 4.0000 mg | ORAL_TABLET | Freq: Three times a day (TID) | ORAL | Status: DC | PRN

## 2016-12-26 MED ORDER — VITAMIN B-1 100 MG PO TABS: 100.0000 mg | ORAL_TABLET | Freq: Every day | ORAL | Status: DC

## 2016-12-26 MED ORDER — LOPERAMIDE HCL 2 MG PO CAPS: 2.0000 mg | ORAL_CAPSULE | ORAL | Status: DC | PRN

## 2016-12-26 MED ORDER — GABAPENTIN 400 MG PO CAPS: 400.0000 mg | ORAL_CAPSULE | Freq: Three times a day (TID) | ORAL | 0 refills | Status: DC

## 2016-12-26 MED ORDER — THIAMINE HCL 100 MG/ML IJ SOLN: 100.0000 mg | Freq: Once | INTRAMUSCULAR | Status: DC

## 2016-12-26 MED ORDER — ADULT MULTIVITAMIN W/MINERALS CH
1.0000 | ORAL_TABLET | Freq: Every day | ORAL | Status: DC
  Administered 2016-12-26: 1 via ORAL
  Filled 2016-12-26: qty 1

## 2016-12-26 MED ORDER — HYDROXYZINE HCL 25 MG PO TABS: 25.0000 mg | ORAL_TABLET | Freq: Four times a day (QID) | ORAL | Status: DC | PRN

## 2016-12-26 MED ORDER — SULFAMETHOXAZOLE-TRIMETHOPRIM 800-160 MG PO TABS: 1.0000 | ORAL_TABLET | Freq: Two times a day (BID) | ORAL | 0 refills | Status: DC

## 2016-12-26 MED ORDER — ONDANSETRON HCL 4 MG PO TABS: 4.0000 mg | ORAL_TABLET | Freq: Three times a day (TID) | ORAL | 0 refills | Status: DC | PRN

## 2016-12-26 MED ORDER — CEPHALEXIN 500 MG PO CAPS: 500.0000 mg | ORAL_CAPSULE | Freq: Two times a day (BID) | ORAL | 0 refills | Status: DC

## 2016-12-26 MED ORDER — LORAZEPAM 1 MG PO TABS: 1.0000 mg | ORAL_TABLET | Freq: Four times a day (QID) | ORAL | Status: DC | PRN

## 2016-12-26 NOTE — ED Notes (Signed)
Hourly rounding reveals patient sleeping in room. No complaints, stable, in no acute distress. Q15 minute rounds and monitoring via Security Cameras to continue. 

## 2016-12-26 NOTE — Patient Outreach (Signed)
ED Peer Support Specialist Patient Intake (Complete at intake & 30-60 Day Follow-up)  Name: Anne Black  MRN: [id number]  Age: 56 y.o.   Date of Admission: 12/26/2016  Intake: Initial Comments:      Primary Reason Admitted: Anne Black is an 56 y.o. female who presents to the ED voluntarily initially due to abdominal pain and while in the ED pt expressed she has been depressed and consuming heavy amounts of alcohol. During the assessment, the pt presented tangential in her speech. Pt was crying at times during the assessment and the next moment she was speaking in a euthymic tone. Pt states she has been depressed because she has not seen her daughters and they don't stay in contact with her the way they used to. Pt states she became upset today because her daughter went out of town and she did not know she was going out of town. Pt states she has a set of twins and a set of triplets. Pt stated an incident occurred that caused her daughters to stop communicating with her but she did not wish to disclose what took place. Pt asked "why are you asking me these questions?" Once pt was explained the reason for the TTS consult, the pt stated "I'm going to get detox tomorrow. My daughter is taking me to [facility] for detox. It is a 30 day program.   Lab values: Alcohol/ETOH: Positive Positive UDS? Yes Amphetamines: No Barbiturates: No Benzodiazepines: Yes Cocaine: No Opiates: No Cannabinoids: No  Demographic information: Gender: Female Ethnicity: White Marital Status: Divorced Insurance Status: Press photographer (Work Engineer, agricultural, Sales executive, etc.:   Lives with: Alone Living situation:    Reported Patient History: Patient reported health conditions: Depression, Other (comment), ADD/ADHD, Bipolar disorder(Anxiety) Patient aware of HIV and hepatitis status:    In past year, has patient visited ED for any reason?    Number of ED visits:     Reason(s) for visit:    In past year, has patient been hospitalized for any reason?    Number of hospitalizations:    Reason(s) for hospitalization:    In past year, has patient been arrested?    Number of arrests:    Reason(s) for arrest:    In past year, has patient been incarcerated?    Number of incarcerations:    Reason(s) for incarceration:    In past year, has patient received medication-assisted treatment?    In past year, patient received the following treatments:    In past year, has patient received any harm reduction services?    Did this include any of the following?    In past year, has patient received care from a mental health provider for diagnosis other than SUD?    In past year, is this first time patient has overdosed?    Number of past overdoses:    In past year, is this first time patient has been hospitalized for an overdose?    Number of hospitalizations for overdose(s):    Is patient currently receiving treatment for a mental health diagnosis?    Patient reports experiencing difficulty participating in SUD treatment:      Most important reason(s) for this difficulty?    Has patient received prior services for treatment?    In past, patient has received services from following agencies:    Plan of Care:  Suggested follow up at these agencies/treatment centers:    Other information:    Bartholomew Boards, CPSS  12/26/2016 11:47 AM

## 2016-12-26 NOTE — ED Notes (Signed)
Patient is alert and oriented. Patient states she wants to go to a detox center and that her family member suppose to take her there today. Patient blames her mom for being here  In the hospital. Patient is calm and cooperative. Denies pain. Q 15 minute checks in progress and patient remains safe on unit. Patient denies SI/HI and A/V/ hallucinations. Monitoring continues.

## 2016-12-26 NOTE — ED Notes (Signed)
Patient discharge home with family. Patient alert and oriented. AVS/Follow-up/Prescriptions given. Instructions reviewed with patient and mom. Patient and mom verbalized understanding and written copy given. Patient given prescriptions. All patient belongings returned to her. Patient escorted to lobby by staff. Patient left ambulatory and medically stable. Patient aware of number to call if questions or problems.

## 2016-12-26 NOTE — ED Notes (Signed)
John with peer support in to visit with patient.

## 2016-12-26 NOTE — [facility] Suicide Risk Assessment (Signed)
Suicide Risk Assessment  Discharge Assessment   [facility] Discharge Suicide Risk Assessment   Principal Problem: Alcohol abuse with alcohol-induced mood disorder [facility]) Discharge Diagnoses:  Patient Active Problem List   Diagnosis Date Noted  . Alcohol abuse with alcohol-induced mood disorder (HCC) [F10.14] 12/15/2015    Priority: High  . Alcohol use disorder, severe, dependence (HCC) [F10.20] 07/31/2015    Priority: High  . Distal radius fracture, left [S52.502A] 09/09/2016  . Thrombocytopenia (HCC) [D69.6] 09/09/2016  . Chest x-ray abnormality [R93.89]   . Umbilical hernia without obstruction and without gangrene [K42.9] 05/12/2016  . Itching [L29.9] 02/24/2016  . Ventral hernia without obstruction or gangrene [K43.9] 02/24/2016  . Palliative care encounter [Z51.5]   . Ascites [R18.8]   . Tachypnea [R06.82]   . Typical atrial flutter (HCC) [I48.3]   . Hypomagnesemia [E83.42]   . Hypoxia [R09.02]   . Dehydration [E86.0] 01/22/2016  . Low back ache [M54.5] 12/30/2015  . Non-intractable vomiting with nausea [R11.2]   . Acute alcoholism (HCC) [F10.20]   . Abdominal distension [R14.0] 11/04/2015  . Alcohol abuse [F10.10]   . Dyspnea [R06.00]   . Acute respiratory failure (HCC) [J96.00] 10/15/2015  . Ascites due to alcoholic cirrhosis (HCC) [K70.31] 10/15/2015  . Osteoporosis [M81.0]   . Bipolar I disorder (HCC) [F31.9]   . Multiple sclerosis (HCC) [G35]   . Alcoholic cirrhosis of liver without ascites (HCC) [K70.30] 08/22/2015  . Bipolar disorder, current episode mixed, moderate (HCC) [F31.62] 08/01/2015  . Macrocytic anemia- due to alcohol abuse with normal B12 & folate levels [D53.9] 05/31/2015  . Severe protein-calorie malnutrition (HCC) [E43] 05/31/2015  . Hypokalemia [E87.6] 05/26/2015  . Encephalopathy, hepatic (HCC) [K72.90] 05/26/2015  . Alcohol withdrawal (HCC) [F10.239] 05/18/2015  . Abdominal pain [R10.9] 05/18/2015  . Anxiety [F41.9] 05/18/2015  . UTI (lower urinary  tract infection) [N39.0] 05/18/2015  . Stimulant abuse (HCC) [F15.10] 12/27/2014  . Nicotine abuse [Z72.0] 12/27/2014  . Hyperprolactinemia (HCC) [E22.1] 11/15/2014  . Dyslipidemia [E78.5]   . Tobacco abuse [Z72.0] 01/23/2014  . Noncompliance with therapeutic plan [Z91.11] 04/04/2013  . Unspecified hereditary and idiopathic peripheral neuropathy [G60.9] 05/01/2012  . GERD (gastroesophageal reflux disease) [K21.9] 05/01/2012  . Osteoarthrosis, unspecified whether generalized or localized, involving lower leg [M17.10] 05/01/2012  . Hyponatremia [E87.1] 07/01/2011    Total Time spent with patient: 45 minutes   Musculoskeletal: Strength & Muscle Tone: within normal limits Gait & Station: normal Patient leans: N/A  Psychiatric Specialty Exam: Physical Exam  Constitutional: She is oriented to person, place, and time. She appears well-developed and well-nourished.  HENT:  Head: Normocephalic.  Neck: Normal range of motion.  Respiratory: Effort normal.  Musculoskeletal: Normal range of motion.  Neurological: She is alert and oriented to person, place, and time.  Psychiatric: She has a normal mood and affect. Her speech is normal and behavior is normal. Judgment and thought content normal. Cognition and memory are normal.    Review of Systems  Psychiatric/Behavioral: Positive for substance abuse.  All other systems reviewed and are negative.   Blood pressure 123/65, pulse 92, temperature 98.2 F (36.8 C), temperature source Oral, resp. rate 18, SpO2 95 %.There is no height or weight on file to calculate BMI.  General Appearance: Casual  Eye Contact:  Good  Speech:  Normal Rate  Volume:  Normal  Mood:  Irritable  Affect:  Congruent  Thought Process:  Coherent and Descriptions of Associations: Intact  Orientation:  Full (Time, Place, and Person)  Thought Content:  WDL and  Logical  Suicidal Thoughts:  No  Homicidal Thoughts:  No  Memory:  Immediate;   Good Recent;    Good Remote;   Good  Judgement:  Fair  Insight:  Fair  Psychomotor Activity:  Normal  Concentration:  Concentration: Good and Attention Span: Good  Recall:  Good  Fund of Knowledge:  Good  Language:  Good  Akathisia:  No  Handed:  Right  AIMS (if indicated):     Assets:  Housing Leisure Time Physical Health Resilience Social Support  ADL's:  Intact  Cognition:  WNL  Sleep:      Mental Status Per Nursing Assessment::   On Admission:   alcohol abuse with depression  Demographic Factors:  NA  Loss Factors: NA  Historical Factors: NA  Risk Reduction Factors:   Sense of responsibility to family, Living with another person, especially a relative and Positive social support  Continued Clinical Symptoms:  Irritable   Cognitive Features That Contribute To Risk:  None    Suicide Risk:  Minimal: No identifiable suicidal ideation.  Patients presenting with no risk factors but with morbid ruminations; may be classified as minimal risk based on the severity of the depressive symptoms    Plan Of Care/Follow-up recommendations:  Activity:  as tolerated Diet:  heart healthy diet  Anne Cressman, NP 12/26/2016, 12:07 PM

## 2016-12-26 NOTE — ED Notes (Signed)
Hourly rounding reveals patient in room. Stable, in no acute distress. Q15 minute rounds and monitoring via Security Cameras to continue. 

## 2016-12-26 NOTE — Consult Note (Signed)
[facility] Consult   Reason for Consult:  Alcohol abuse with depression Referring Physician:  EDP Patient Identification: MCKAILA DUFFUS MRN:  [id number] Principal Diagnosis: Alcohol abuse with alcohol-induced mood disorder [facility]) Diagnosis:   Patient Active Problem List   Diagnosis Date Noted  . Alcohol abuse with alcohol-induced mood disorder ([place]) [F10.14] 12/15/2015    Priority: High  . Alcohol use disorder, severe, dependence ([place]) [F10.20] 07/31/2015    Priority: High  . Distal radius fracture, left [S52.502A] 09/09/2016  . Thrombocytopenia ([place]) [D69.6] 09/09/2016  . Chest x-ray abnormality [R93.89]   . Umbilical hernia without obstruction and without gangrene [K42.9] 05/12/2016  . Itching [L29.9] 02/24/2016  . Ventral hernia without obstruction or gangrene [K43.9] 02/24/2016  . Palliative care encounter [Z51.5]   . Ascites [R18.8]   . Tachypnea [R06.82]   . Typical atrial flutter (HCC) [I48.3]   . Hypomagnesemia [E83.42]   . Hypoxia [R09.02]   . Dehydration [E86.0] 01/22/2016  . Low back ache [M54.5] 12/30/2015  . Non-intractable vomiting with nausea [R11.2]   . Acute alcoholism ([place]) [F10.20]   . Abdominal distension [R14.0] 11/04/2015  . Alcohol abuse [F10.10]   . Dyspnea [R06.00]   . Acute respiratory failure ([place]) [J96.00] 10/15/2015  . Ascites due to alcoholic cirrhosis ([place]) [E72.09] 10/15/2015  . Osteoporosis [M81.0]   . Bipolar I disorder ([place]) [F31.9]   . Multiple sclerosis ([place]) [G35]   . Alcoholic cirrhosis of liver without ascites ([place]) [K70.30] 08/22/2015  . Bipolar disorder, current episode mixed, moderate ([place]) [F31.62] 08/01/2015  . Macrocytic anemia- due to alcohol abuse with normal B12 & folate levels [D53.9] 05/31/2015  . Severe protein-calorie malnutrition ([place]) [E43] 05/31/2015  . Hypokalemia [E87.6] 05/26/2015  . Encephalopathy, hepatic ([place]) [K72.90] 05/26/2015  . Alcohol withdrawal ([place]) [F10.239] 05/18/2015  . Abdominal pain  [R10.9] 05/18/2015  . Anxiety [F41.9] 05/18/2015  . UTI (lower urinary tract infection) [N39.0] 05/18/2015  . Stimulant abuse ([place]) [F15.10] 12/27/2014  . Nicotine abuse [Z72.0] 12/27/2014  . Hyperprolactinemia ([place]) [E22.1] 11/15/2014  . Dyslipidemia [E78.5]   . Tobacco abuse [Z72.0] 01/23/2014  . Noncompliance with therapeutic plan [Z91.11] 04/04/2013  . Unspecified hereditary and idiopathic peripheral neuropathy [G60.9] 05/01/2012  . GERD (gastroesophageal reflux disease) [K21.9] 05/01/2012  . Osteoarthrosis, unspecified whether generalized or localized, involving lower leg [M17.10] 05/01/2012  . Hyponatremia [E87.1] 07/01/2011    Total Time spent with patient: 45 minutes  Subjective:   PIERA DOWNS is a 56 y.o. female patient does not warrant admission  HPI:  56 yo female who came to the ED with alcohol abuse and depression.  She is scheduled to go to rehab today with her daughter driving her to the place in [place], [place].  Yesterday, she did not want to go.  Today, Sharra told the providers this morning that she was ready to go, no suicidal/homicidal ideations, hallucinations, or withdrawal symptoms.  She later wanted to talk to the social worker because she wanted a rehab that was close, [facility] or [place].  Peer support was consulted and gave her these resources.  She then wanted them to call her mother.  Stable for discharge, encouraged to go to [facility] into the rehab her family arranged for her.  Past Psychiatric History: alcohol abuse  Risk to Self: None Risk to Others: Homicidal Ideation: No Thoughts of Harm to Others: No Current Homicidal Intent: No Current Homicidal Plan: No Access to Homicidal Means: No History of harm to others?: No Assessment of Violence: None Noted Does  patient have access to weapons?: No Criminal Charges Pending?: No Does patient have a court date: No Prior Inpatient Therapy: Prior Inpatient Therapy: Yes Prior Therapy Dates:  2017 Prior Therapy Facilty/Provider(s): [facility] Reason for Treatment: Bipolar D/O, Alcohol Use Prior Outpatient Therapy: Prior Outpatient Therapy: Yes Prior Therapy Dates: pt unable to recall Prior Therapy Facilty/Provider(s): Pt stated "Dr Doreen Beam, [facility]" Reason for Treatment: unknown Does patient have an ACCT team?: No Does patient have Intensive In-House Services?  : No Does patient have [name] services? : No Does patient have P4CC services?: No  Past Medical History:  Past Medical History:  Diagnosis Date  . ADHD (attention deficit hyperactivity disorder)   . Anxiety   . Arthritis   . Ascites   . Bipolar disorder ([place])   . Cirrhosis ([place])   . Hypokalemia   . Migraine   . Multiple sclerosis ([place])   . Osteoporosis     Past Surgical History:  Procedure Laterality Date  . CESAREAN SECTION  [id number]  . MYRINGOTOMY WITH TUBE PLACEMENT Bilateral   . OPEN REDUCTION INTERNAL FIXATION (ORIF) LEFT WRIST FRACTURE, LEFT CARPAL TUNNEL RELEASE Left 09/09/2016   Performed by Roseanne Kaufman, MD at [facility] ORS  . TONSILLECTOMY     Family History:  Family History  Problem Relation Age of Onset  . Arrhythmia Mother   . Heart disease Father   . Hypertension Father    Family Psychiatric  History: unknown Social History:  Social History   Substance and Sexual Activity  Alcohol Use Yes  . Alcohol/week: 12.6 oz  . Types: 21 Glasses of wine per week     Social History   Substance and Sexual Activity  Drug Use No    Social History   Socioeconomic History  . Marital status: Divorced    Spouse name: n/a  . Number of children: 5  . Years of education: College  . Highest education level: None  Social Needs  . Financial resource strain: None  . Food insecurity - worry: None  . Food insecurity - inability: None  . Transportation needs - medical: None  . Transportation needs - non-medical: None  Occupational History  . Occupation: disabled    Comment: on  social security disability for MS  Tobacco Use  . Smoking status: Current Every Day Smoker    Packs/day: 1.00    Years: 20.00    Pack years: 20.00    Types: Cigarettes  . Smokeless tobacco: Never Used  . Tobacco comment: Currently Vaping Daily  Substance and Sexual Activity  . Alcohol use: Yes    Alcohol/week: 12.6 oz    Types: 21 Glasses of wine per week  . Drug use: No  . Sexual activity: Not Currently  Other Topics Concern  . None  Social History Narrative   Former Teaching laboratory technician.   Started graduate school in counseling, but didn't finish when a good job came open.   Lives alone.   One daughter lives in [place].   Another daughter is in [place].   The other three live in [place], along with her mother.   Father lives in the [place], [place] area.   Additional Social History:    Allergies:  No Known Allergies  Labs:  Results for orders placed or performed during the hospital encounter of 12/25/16 (from the past 48 hour(s))  Urinalysis, Routine w reflex microscopic     Status: Abnormal   Collection Time: 12/25/16  6:24 PM  Result Value Ref Range  Color, Urine AMBER (A) YELLOW    Comment: BIOCHEMICALS MAY BE AFFECTED BY COLOR   APPearance HAZY (A) CLEAR   Specific Gravity, Urine 1.019 1.005 - 1.030   pH 6.0 5.0 - 8.0   Glucose, UA NEGATIVE NEGATIVE mg/dL   Hgb urine dipstick NEGATIVE NEGATIVE   Bilirubin Urine NEGATIVE NEGATIVE   Ketones, ur NEGATIVE NEGATIVE mg/dL   Protein, ur NEGATIVE NEGATIVE mg/dL   Nitrite POSITIVE (A) NEGATIVE   Leukocytes, UA TRACE (A) NEGATIVE   RBC / HPF 0-5 0 - 5 RBC/hpf   WBC, UA TOO NUMEROUS TO COUNT 0 - 5 WBC/hpf   Bacteria, UA RARE (A) NONE SEEN   Squamous Epithelial / LPF 0-5 (A) NONE SEEN   Mucus PRESENT   Lipase, blood     Status: None   Collection Time: 12/25/16  6:28 PM  Result Value Ref Range   Lipase 31 11 - 51 U/L  Comprehensive metabolic panel     Status: Abnormal   Collection Time: 12/25/16  6:28 PM   Result Value Ref Range   Sodium 137 135 - 145 mmol/L   Potassium 3.9 3.5 - 5.1 mmol/L   Chloride 106 101 - 111 mmol/L   CO2 22 22 - 32 mmol/L   Glucose, Bld 97 65 - 99 mg/dL   BUN 7 6 - 20 mg/dL   Creatinine, Ser 0.44 0.44 - 1.00 mg/dL   Calcium 8.6 (L) 8.9 - 10.3 mg/dL   Total Protein 7.6 6.5 - 8.1 g/dL   Albumin 3.9 3.5 - 5.0 g/dL   AST 49 (H) 15 - 41 U/L   ALT 25 14 - 54 U/L   Alkaline Phosphatase 137 (H) 38 - 126 U/L   Total Bilirubin 0.8 0.3 - 1.2 mg/dL   GFR calc non Af Amer >60 >60 mL/min   GFR calc Af Amer >60 >60 mL/min    Comment: (NOTE) The eGFR has been calculated using the CKD EPI equation. This calculation has not been validated in all clinical situations. eGFR's persistently <60 mL/min signify possible Chronic Kidney Disease.    Anion gap 9 5 - 15  CBC     Status: Abnormal   Collection Time: 12/25/16  6:28 PM  Result Value Ref Range   WBC 9.4 4.0 - 10.5 K/uL   RBC 4.12 3.87 - 5.11 MIL/uL   Hemoglobin 11.7 (L) 12.0 - 15.0 g/dL   HCT 38.0 36.0 - 46.0 %   MCV 92.2 78.0 - 100.0 fL   MCH 28.4 26.0 - 34.0 pg   MCHC 30.8 30.0 - 36.0 g/dL   RDW 15.0 11.5 - 15.5 %   Platelets 185 150 - 400 K/uL  Urine rapid drug screen (hosp performed)     Status: Abnormal   Collection Time: 12/25/16  6:59 PM  Result Value Ref Range   Opiates NONE DETECTED NONE DETECTED   Cocaine NONE DETECTED NONE DETECTED   Benzodiazepines POSITIVE (A) NONE DETECTED   Amphetamines NONE DETECTED NONE DETECTED   Tetrahydrocannabinol NONE DETECTED NONE DETECTED   Barbiturates NONE DETECTED NONE DETECTED    Comment:        DRUG SCREEN FOR MEDICAL PURPOSES ONLY.  IF CONFIRMATION IS NEEDED FOR ANY PURPOSE, NOTIFY LAB WITHIN 5 DAYS.        LOWEST DETECTABLE LIMITS FOR URINE DRUG SCREEN Drug Class       Cutoff (ng/mL) Amphetamine      1000 Barbiturate      200 Benzodiazepine   545 Tricyclics  300 Opiates          300 Cocaine          300 THC              50   Ethanol     Status:  Abnormal   Collection Time: 12/25/16  7:11 PM  Result Value Ref Range   Alcohol, Ethyl (B) 131 (H) <10 mg/dL    Comment:        LOWEST DETECTABLE LIMIT FOR SERUM ALCOHOL IS 10 mg/dL FOR MEDICAL PURPOSES ONLY   Protime-INR     Status: None   Collection Time: 12/25/16  7:11 PM  Result Value Ref Range   Prothrombin Time 14.2 11.4 - 15.2 seconds   INR 1.11   I-Stat beta hCG blood, ED     Status: None   Collection Time: 12/25/16  9:43 PM  Result Value Ref Range   I-stat hCG, quantitative <5.0 <5 mIU/mL   Comment 3            Comment:   GEST. AGE      CONC.  (mIU/mL)   <=1 WEEK        5 - 50     2 WEEKS       50 - 500     3 WEEKS       100 - 10,000     4 WEEKS     1,000 - 30,000        FEMALE AND NON-PREGNANT FEMALE:     LESS THAN 5 mIU/mL     Current Facility-Administered Medications  Medication Dose Route Frequency Provider Last Rate Last Dose  . alum & mag hydroxide-simeth (MAALOX/MYLANTA) 200-200-20 MG/5ML suspension 30 mL  30 mL Oral Q6H PRN Little, Wenda Overland, MD   30 mL at 12/25/16 2219  . cephALEXin (KEFLEX) capsule 500 mg  500 mg Oral Q12H Little, Wenda Overland, MD   500 mg at 12/26/16 0933  . gabapentin (NEURONTIN) capsule 400 mg  400 mg Oral TID Patrecia Pour, NP      . hydrOXYzine (ATARAX/VISTARIL) tablet 25 mg  25 mg Oral Q6H PRN Patrecia Pour, NP      . loperamide (IMODIUM) capsule 2-4 mg  2-4 mg Oral PRN Patrecia Pour, NP      . LORazepam (ATIVAN) tablet 1 mg  1 mg Oral Q6H PRN Patrecia Pour, NP      . LORazepam (ATIVAN) tablet 1 mg  1 mg Oral QID Patrecia Pour, NP       Followed by  . [START ON 12/27/2016] LORazepam (ATIVAN) tablet 1 mg  1 mg Oral TID Patrecia Pour, NP       Followed by  . [START ON 12/28/2016] LORazepam (ATIVAN) tablet 1 mg  1 mg Oral BID Patrecia Pour, NP       Followed by  . [START ON 12/29/2016] LORazepam (ATIVAN) tablet 1 mg  1 mg Oral Daily Lord, Jamison Y, NP      . multivitamin with minerals tablet 1 tablet  1 tablet Oral  Daily Patrecia Pour, NP      . Derrill Memo ON 12/29/2016] ondansetron (ZOFRAN) tablet 4 mg  4 mg Oral Q8H PRN Patrecia Pour, NP      . ondansetron (ZOFRAN-ODT) disintegrating tablet 4 mg  4 mg Oral Q6H PRN Patrecia Pour, NP      . senna (SENOKOT) tablet 17.2 mg  2 tablet Oral Daily Little,  Wenda Overland, MD   17.2 mg at 12/26/16 0934  . sulfamethoxazole-trimethoprim (BACTRIM DS,SEPTRA DS) 800-160 MG per tablet 1 tablet  1 tablet Oral Q12H Little, Wenda Overland, MD   1 tablet at 12/26/16 0933  . thiamine (VITAMIN B-1) tablet 100 mg  100 mg Oral Daily Little, Wenda Overland, MD   100 mg at 12/26/16 5638   Or  . thiamine (B-1) injection 100 mg  100 mg Intravenous Daily Little, Wenda Overland, MD      . thiamine (B-1) injection 100 mg  100 mg Intramuscular Once Patrecia Pour, NP      . Derrill Memo ON 12/27/2016] thiamine (VITAMIN B-1) tablet 100 mg  100 mg Oral Daily Patrecia Pour, NP       Current Outpatient Medications  Medication Sig Dispense Refill  . albuterol (PROVENTIL HFA;VENTOLIN HFA) 108 (90 Base) MCG/ACT inhaler Inhale 2 puffs into the lungs every 6 (six) hours as needed for wheezing or shortness of breath.    . busPIRone (BUSPAR) 15 MG tablet TAKE 1 TABLET(15 MG) BY MOUTH TWICE DAILY (Patient taking differently: Take 15 mg by mouth two times a day) 60 tablet 0  . FLUoxetine (PROZAC) 20 MG capsule Take 20 mg by mouth daily.     . Fluticasone-Salmeterol (ADVAIR) 100-50 MCG/DOSE AEPB Inhale 1-2 puffs 2 (two) times daily into the lungs.     . furosemide (LASIX) 40 MG tablet Take 1 tablet (40 mg total) by mouth daily. 30 tablet 5  . gabapentin (NEURONTIN) 100 MG capsule Take 2 capsules (200 mg total) by mouth 3 (three) times daily. (Patient taking differently: Take 200 mg 2 (two) times daily as needed by mouth (for pain). ) 180 capsule 0  . lisdexamfetamine (VYVANSE) 70 MG capsule Take 70 mg every morning by mouth.    . Melatonin 3 MG TABS Take 1 tablet (3 mg total) by mouth at bedtime as needed  (sleep). 30 tablet 0  . meloxicam (MOBIC) 7.5 MG tablet Take 7.5 mg by mouth daily.  0  . modafinil (PROVIGIL) 200 MG tablet Take 200 mg by mouth daily.  1  . pantoprazole (PROTONIX) 40 MG tablet Take 1 tablet (40 mg total) by mouth daily. 30 tablet 3  . promethazine (PHENERGAN) 25 MG tablet Take 25 mg by mouth every 6 (six) hours as needed for nausea or vomiting.    . rifaximin (XIFAXAN) 550 MG TABS tablet Take 1 tablet (550 mg total) by mouth 2 (two) times daily. 60 tablet 0  . risperiDONE (RISPERDAL) 0.5 MG tablet Take 1 tablet (0.5 mg total) by mouth 3 (three) times daily as needed (agitation). (Patient taking differently: Take 0.5 mg by mouth 2 (two) times daily. ) 30 tablet 0  . rizatriptan (MAXALT) 10 MG tablet Take 1 tablet (10 mg total) by mouth as needed for migraine. May repeat in 2 hours if needed 10 tablet 0  . spironolactone (ALDACTONE) 100 MG tablet Take 1 tablet (100 mg total) by mouth daily. 30 tablet 1  . topiramate (TOPAMAX) 100 MG tablet Take 1 tablet (100 mg total) by mouth daily. 30 tablet 0  . ondansetron (ZOFRAN ODT) 8 MG disintegrating tablet Take 1 tablet (8 mg total) by mouth every 8 (eight) hours as needed for nausea or vomiting. (Patient not taking: Reported on 12/25/2016) 10 tablet 0  . ondansetron (ZOFRAN) 4 MG tablet Take 1 tablet (4 mg total) every 8 (eight) hours as needed by mouth for nausea or vomiting. 20 tablet 0  Musculoskeletal: Strength & Muscle Tone: within normal limits Gait & Station: normal Patient leans: N/A  Psychiatric Specialty Exam: Physical Exam  Constitutional: She is oriented to person, place, and time. She appears well-developed and well-nourished.  HENT:  Head: Normocephalic.  Neck: Normal range of motion.  Respiratory: Effort normal.  Musculoskeletal: Normal range of motion.  Neurological: She is alert and oriented to person, place, and time.  Psychiatric: She has a normal mood and affect. Her speech is normal and behavior is  normal. Judgment and thought content normal. Cognition and memory are normal.    Review of Systems  Psychiatric/Behavioral: Positive for substance abuse.  All other systems reviewed and are negative.   Blood pressure 123/65, pulse 92, temperature 98.2 F (36.8 C), temperature source Oral, resp. rate 18, SpO2 95 %.There is no height or weight on file to calculate BMI.  General Appearance: Casual  Eye Contact:  Good  Speech:  Normal Rate  Volume:  Normal  Mood:  Irritable  Affect:  Congruent  Thought Process:  Coherent and Descriptions of Associations: Intact  Orientation:  Full (Time, Place, and Person)  Thought Content:  WDL and Logical  Suicidal Thoughts:  No  Homicidal Thoughts:  No  Memory:  Immediate;   Good Recent;   Good Remote;   Good  Judgement:  Fair  Insight:  Fair  Psychomotor Activity:  Normal  Concentration:  Concentration: Good and Attention Span: Good  Recall:  Good  Fund of Knowledge:  Good  Language:  Good  Akathisia:  No  Handed:  Right  AIMS (if indicated):     Assets:  Housing Leisure Time Physical Health Resilience Social Support  ADL's:  Intact  Cognition:  WNL  Sleep:        Treatment Plan Summary: Daily contact with patient to assess and evaluate symptoms and progress in treatment, Medication management and Plan alcohol abuse with alcohol induced mood disorder:  -Crisis stabilization -Medication management:  Started Ativan alcohol detox protocol inpatient, gabapentin at discharge for withdrawal 400 mg TID and Zofran 4 mg PRN nausea; antibiotics continued from the EDP -Individual and substance abuse counseling -Peer support referral  Disposition: No evidence of imminent risk to self or others at present.    Waylan Boga, NP 12/26/2016 10:41 AM  Patient seen face-to-face for psychiatric evaluation, chart reviewed and case discussed with the physician extender and developed treatment plan. Reviewed the information documented and agree with  the treatment plan. Corena Pilgrim, MD

## 2016-12-28 ENCOUNTER — Telehealth: Payer: Self-pay | Admitting: Family Medicine

## 2016-12-28 DIAGNOSIS — M1990 Unspecified osteoarthritis, unspecified site: Secondary | ICD-10-CM

## 2016-12-28 NOTE — Telephone Encounter (Signed)
Pt called [facility] 12/14/16 requesting referral to pain management. Please advise. Thanks!

## 2016-12-28 NOTE — Telephone Encounter (Signed)
Referral placed.

## 2016-12-29 ENCOUNTER — Telehealth (HOSPITAL_COMMUNITY): Payer: Self-pay

## 2016-12-29 ENCOUNTER — Inpatient Hospital Stay (HOSPITAL_COMMUNITY)
Admission: EM | Admit: 2016-12-29 | Discharge: 2017-01-11 | DRG: 353 | Disposition: A | Discharge status: Home Health | Payer: 59 | Attending: Family Medicine | Admitting: Family Medicine

## 2016-12-29 ENCOUNTER — Ambulatory Visit: Payer: Self-pay | Admitting: *Deleted

## 2016-12-29 ENCOUNTER — Encounter (HOSPITAL_COMMUNITY): Payer: Self-pay

## 2016-12-29 DIAGNOSIS — R45851 Suicidal ideations: Secondary | ICD-10-CM | POA: Diagnosis present

## 2016-12-29 DIAGNOSIS — Z9289 Personal history of other medical treatment: Secondary | ICD-10-CM

## 2016-12-29 DIAGNOSIS — K420 Umbilical hernia with obstruction, without gangrene: Principal | ICD-10-CM | POA: Diagnosis present

## 2016-12-29 DIAGNOSIS — J9602 Acute respiratory failure with hypercapnia: Secondary | ICD-10-CM | POA: Diagnosis not present

## 2016-12-29 DIAGNOSIS — I8500 Esophageal varices without bleeding: Secondary | ICD-10-CM | POA: Diagnosis present

## 2016-12-29 DIAGNOSIS — I864 Gastric varices: Secondary | ICD-10-CM | POA: Diagnosis present

## 2016-12-29 DIAGNOSIS — Z8249 Family history of ischemic heart disease and other diseases of the circulatory system: Secondary | ICD-10-CM

## 2016-12-29 DIAGNOSIS — F17210 Nicotine dependence, cigarettes, uncomplicated: Secondary | ICD-10-CM | POA: Diagnosis present

## 2016-12-29 DIAGNOSIS — G9349 Other encephalopathy: Secondary | ICD-10-CM | POA: Diagnosis not present

## 2016-12-29 DIAGNOSIS — D649 Anemia, unspecified: Secondary | ICD-10-CM | POA: Diagnosis present

## 2016-12-29 DIAGNOSIS — R197 Diarrhea, unspecified: Secondary | ICD-10-CM | POA: Diagnosis not present

## 2016-12-29 DIAGNOSIS — F319 Bipolar disorder, unspecified: Secondary | ICD-10-CM | POA: Diagnosis present

## 2016-12-29 DIAGNOSIS — Z79899 Other long term (current) drug therapy: Secondary | ICD-10-CM

## 2016-12-29 DIAGNOSIS — K439 Ventral hernia without obstruction or gangrene: Secondary | ICD-10-CM | POA: Diagnosis present

## 2016-12-29 DIAGNOSIS — G35 Multiple sclerosis: Secondary | ICD-10-CM | POA: Diagnosis present

## 2016-12-29 DIAGNOSIS — G609 Hereditary and idiopathic neuropathy, unspecified: Secondary | ICD-10-CM | POA: Diagnosis present

## 2016-12-29 DIAGNOSIS — M810 Age-related osteoporosis without current pathological fracture: Secondary | ICD-10-CM | POA: Diagnosis present

## 2016-12-29 DIAGNOSIS — D72829 Elevated white blood cell count, unspecified: Secondary | ICD-10-CM | POA: Diagnosis not present

## 2016-12-29 DIAGNOSIS — K7031 Alcoholic cirrhosis of liver with ascites: Secondary | ICD-10-CM | POA: Diagnosis not present

## 2016-12-29 DIAGNOSIS — I700 Atherosclerosis of aorta: Secondary | ICD-10-CM | POA: Diagnosis present

## 2016-12-29 DIAGNOSIS — K219 Gastro-esophageal reflux disease without esophagitis: Secondary | ICD-10-CM | POA: Diagnosis present

## 2016-12-29 DIAGNOSIS — J9690 Respiratory failure, unspecified, unspecified whether with hypoxia or hypercapnia: Secondary | ICD-10-CM

## 2016-12-29 DIAGNOSIS — E876 Hypokalemia: Secondary | ICD-10-CM | POA: Diagnosis present

## 2016-12-29 DIAGNOSIS — I480 Paroxysmal atrial fibrillation: Secondary | ICD-10-CM | POA: Diagnosis not present

## 2016-12-29 DIAGNOSIS — F1020 Alcohol dependence, uncomplicated: Secondary | ICD-10-CM | POA: Diagnosis not present

## 2016-12-29 DIAGNOSIS — F10239 Alcohol dependence with withdrawal, unspecified: Secondary | ICD-10-CM | POA: Diagnosis present

## 2016-12-29 DIAGNOSIS — E872 Acidosis: Secondary | ICD-10-CM | POA: Diagnosis present

## 2016-12-29 DIAGNOSIS — A419 Sepsis, unspecified organism: Secondary | ICD-10-CM | POA: Diagnosis not present

## 2016-12-29 DIAGNOSIS — G4700 Insomnia, unspecified: Secondary | ICD-10-CM | POA: Diagnosis not present

## 2016-12-29 DIAGNOSIS — Z9889 Other specified postprocedural states: Secondary | ICD-10-CM

## 2016-12-29 DIAGNOSIS — J449 Chronic obstructive pulmonary disease, unspecified: Secondary | ICD-10-CM | POA: Diagnosis present

## 2016-12-29 DIAGNOSIS — J9601 Acute respiratory failure with hypoxia: Secondary | ICD-10-CM | POA: Diagnosis not present

## 2016-12-29 DIAGNOSIS — R451 Restlessness and agitation: Secondary | ICD-10-CM | POA: Diagnosis not present

## 2016-12-29 DIAGNOSIS — D696 Thrombocytopenia, unspecified: Secondary | ICD-10-CM | POA: Diagnosis not present

## 2016-12-29 DIAGNOSIS — L03311 Cellulitis of abdominal wall: Secondary | ICD-10-CM | POA: Diagnosis not present

## 2016-12-29 DIAGNOSIS — F419 Anxiety disorder, unspecified: Secondary | ICD-10-CM | POA: Diagnosis present

## 2016-12-29 DIAGNOSIS — R739 Hyperglycemia, unspecified: Secondary | ICD-10-CM | POA: Diagnosis not present

## 2016-12-29 DIAGNOSIS — Z4659 Encounter for fitting and adjustment of other gastrointestinal appliance and device: Secondary | ICD-10-CM

## 2016-12-29 DIAGNOSIS — F909 Attention-deficit hyperactivity disorder, unspecified type: Secondary | ICD-10-CM | POA: Diagnosis present

## 2016-12-29 LAB — COMPREHENSIVE METABOLIC PANEL
ALK PHOS: 143 U/L — AB (ref 38–126)
ALT: 22 U/L (ref 14–54)
AST: 41 U/L (ref 15–41)
Albumin: 4.2 g/dL (ref 3.5–5.0)
Anion gap: 16 — ABNORMAL HIGH (ref 5–15)
BILIRUBIN TOTAL: 1.5 mg/dL — AB (ref 0.3–1.2)
CALCIUM: 8.6 mg/dL — AB (ref 8.9–10.3)
CO2: 30 mmol/L (ref 22–32)
Chloride: 87 mmol/L — ABNORMAL LOW (ref 101–111)
Creatinine, Ser: 0.65 mg/dL (ref 0.44–1.00)
GFR calc Af Amer: >60 mL/min (ref >60)
Glucose, Bld: 105 mg/dL — ABNORMAL HIGH (ref 65–99)
POTASSIUM: 2.6 mmol/L — AB (ref 3.5–5.1)
Sodium: 133 mmol/L — ABNORMAL LOW (ref 135–145)
TOTAL PROTEIN: 8.3 g/dL — AB (ref 6.5–8.1)

## 2016-12-29 LAB — RAPID URINE DRUG SCREEN, HOSP PERFORMED
Amphetamines: NOT DETECTED
BARBITURATES: NOT DETECTED
BENZODIAZEPINES: POSITIVE — AB
Cocaine: NOT DETECTED
Opiates: NOT DETECTED
Tetrahydrocannabinol: NOT DETECTED

## 2016-12-29 LAB — CBC
HEMATOCRIT: 40 % (ref 36.0–46.0)
Hemoglobin: 12.8 g/dL (ref 12.0–15.0)
MCH: 29 pg (ref 26.0–34.0)
MCHC: 32 g/dL (ref 30.0–36.0)
MCV: 90.7 fL (ref 78.0–100.0)
PLATELETS: 182 10*3/uL (ref 150–400)
RBC: 4.41 MIL/uL (ref 3.87–5.11)
RDW: 15 % (ref 11.5–15.5)
WBC: 11 10*3/uL — AB (ref 4.0–10.5)

## 2016-12-29 LAB — I-STAT BETA HCG BLOOD, ED (MC, WL, AP ONLY)

## 2016-12-29 LAB — URINALYSIS, ROUTINE W REFLEX MICROSCOPIC
BILIRUBIN URINE: NEGATIVE
Glucose, UA: NEGATIVE mg/dL
Hgb urine dipstick: NEGATIVE
KETONES UR: NEGATIVE mg/dL
LEUKOCYTES UA: NEGATIVE
NITRITE: NEGATIVE
PROTEIN: NEGATIVE mg/dL
Specific Gravity, Urine: 1.005 (ref 1.005–1.030)
pH: 7 (ref 5.0–8.0)

## 2016-12-29 LAB — SALICYLATE LEVEL: Salicylate Lvl: <7 mg/dL (ref 2.8–30.0)

## 2016-12-29 LAB — ETHANOL

## 2016-12-29 LAB — ACETAMINOPHEN LEVEL: Acetaminophen (Tylenol), Serum: <10 ug/mL — ABNORMAL LOW (ref 10–30)

## 2016-12-29 LAB — LIPASE, BLOOD: LIPASE: 35 U/L (ref 11–51)

## 2016-12-29 MED ORDER — ONDANSETRON HCL 4 MG/2ML IJ SOLN
4.0000 mg | Freq: Once | INTRAMUSCULAR | Status: AC
  Administered 2016-12-30: 4 mg via INTRAVENOUS
  Filled 2016-12-29: qty 2

## 2016-12-29 MED ORDER — POTASSIUM CHLORIDE 10 MEQ/100ML IV SOLN
10.0000 meq | Freq: Once | INTRAVENOUS | Status: AC
  Administered 2016-12-30: 10 meq via INTRAVENOUS
  Filled 2016-12-29: qty 100

## 2016-12-29 MED ORDER — ONDANSETRON 4 MG PO TBDP
4.0000 mg | ORAL_TABLET | Freq: Once | ORAL | Status: AC | PRN
  Administered 2016-12-29: 4 mg via ORAL
  Filled 2016-12-29: qty 1

## 2016-12-29 MED ORDER — SODIUM CHLORIDE 0.9 % IV BOLUS (SEPSIS)
1000.0000 mL | Freq: Once | INTRAVENOUS | Status: AC
  Administered 2016-12-30: 1000 mL via INTRAVENOUS

## 2016-12-29 MED ORDER — HYDROXYZINE HCL 25 MG PO TABS
25.0000 mg | ORAL_TABLET | Freq: Once | ORAL | Status: AC
  Administered 2016-12-30: 25 mg via ORAL
  Filled 2016-12-29: qty 1

## 2016-12-29 MED ORDER — POTASSIUM CHLORIDE CRYS ER 20 MEQ PO TBCR
40.0000 meq | EXTENDED_RELEASE_TABLET | Freq: Once | ORAL | Status: AC
  Administered 2016-12-30: 40 meq via ORAL
  Filled 2016-12-29: qty 2

## 2016-12-29 NOTE — ED Triage Notes (Signed)
Pt BIB [facility], pt is detoxing from alcohol, last drink yesterday evening, going to rehab Monday. C/o of muscle cramping in the back and stomach along with nausea. Pt recently diagnosed with UTI and infected hernia, noncompliant with antibiotics. Pt called a nurse line and stated she was SI, denies now.

## 2016-12-29 NOTE — ED Triage Notes (Addendum)
Pt denies SI/HI/AVH. States "I just cant take this pain and I dont feel right and I feel desperately sick and scared" Pt reports being depressed and is not taking care of herself at home. States she has a lot of anxiety and she scared about how depressed and bad she feels. States she does not want to be seen for mental health only her abd pain.

## 2016-12-29 NOTE — Telephone Encounter (Signed)
  Reason for Disposition . [1] Constant abdominal pain AND [2] present > 2 hours  Answer Assessment - Initial Assessment Questions 1. DO YOU DRINK: "Do you drink alcohol, including beer, wine or hard liquor?"     Yes 2. HOW OFTEN: "How many days per week do you typically drink alcohol?"     Not assessed 3. HOW MUCH: "How many drinks do you typically have on days when you drink?" (one drink = 1.5 oz alcohol, 5 oz wine, 12 oz beer)     Not assessed 4. MOST: "What is the most that you have had to drink on any one occasion in the last month?"     Not assessed 5. LAST 24 HOURS: "Have you had a drink within the last 24 hours?"     Last drink last night 6. DRINKING PROBLEM: "Do you have or have you ever had a drinking problem?"     Yes 7. DRUG PROBLEM: "Are you using any other drugs?" (e.g., yes/no; cocaine, prescription medications, etc.)     Not assessed 8. SYMPTOMS: "What symptoms are you currently experiencing?" (e.g., none, tremors or shakiness, abdominal pain, vomiting, blackout spells)     Nausea, abdominal pain, feels like skin is burning 9. DETOX PROGRAM: "Have you ever gone through a detox program?"     Yes, pt is currently in contact with tx center in [place] 10. THERAPIST: "Do you have a counselor or therapist? Name?"       Yes has contacted detox center in [place]. SUPPORT: "Who is with you now?" "Who do you live with?" "Do you have family or friends nearby who you can talk to?" "Are you a member of Alcoholics Anonymous?"       No one, attempting to get in contact with daughter 53. PREGNANCY: "Is there any chance you are pregnant?" "When was your last menstrual period?"       Not assessed  Protocols used: ALCOHOL ABUSE AND DEPENDENCE-A-AH  Pt asking if she can have a prescription for Librium, for withdrawal symptoms. Advised pt that she needs to be evaluated for nausea and abdominal pain. Pt states she has contacted a recovery place in [place], and is trying to get in  contact with her daughter to see if she can drive her to [facility] in order to receive treatment.

## 2016-12-29 NOTE — Telephone Encounter (Unsigned)
Copied from CRM [id number]. Topic: Inquiry >> Dec 29, 2016  3:36 PM Anice PaganiniMunoz, Barak Bialecki I, [place] wrote: Reason for CRM:Rx Fill Risperdal 0.5 Mg she is complete out of These Med and she worry not to have it.Thank

## 2016-12-29 NOTE — H&P (Addendum)
History and Physical    Anne Black ZOX:[id number] DOB: 06-Dec-1960 DOA: 12/29/2016  Referring MD/NP/PA: Dr. Judd Lien PCP: Patient, No Pcp Per  Patient coming from: Home   Chief Complaint:   I have personally briefly reviewed patient's old medical records in [facility]   HPI: Anne Black is a 56 y.o. female with medical history significant of alcohol abuse, cirrhosis, hypokalemia, bipolar disorder, and MS followed at [facility]; who presents with complaints of having cramps all over her body over the last 2 days.  Reports associated symptoms of generalized malaise, weakness/ fatigue, decreased appetite, and nausea.  She reports drinking anywhere from 4-5 glasses of wine per day on average and her last drink was sometime yesterday.  Review of records shows the patient was seen in the ED on 11/11 and at that time had a CT scan that showed a ventral wall hernia which was new surrounding edema.  However since that time patient notes that area now has open wound that has purulent drainage and the surrounding skin is red and inflamed.  She states that she is an constant pain and thinks that she is having an MS flare for which she needs high-dose Solu-Medrol.  Patient reports being unaware of her last bowel movement or her ability to pass flatus at this time.  Patient reports that she previously had the area evaluated by general surgery, but never followed up. Patient states that she feels hopeless, but does not state specific plan of self harm.   ED Course: Upon admission to the emergency department patient was seen to be afebrile, pulse 88-99, respirations 18 4, and all other vital signs relatively within normal limits.  Labs revealed a WBC 11, potassium 2.6.  Patient was started on empiric antibiotics of vancomycin.  Review of Systems  Constitutional: Positive for chills and malaise/fatigue.  HENT: Negative for ear discharge and ear pain.   Eyes: Negative for photophobia and pain.  Respiratory:  Negative for sputum production and shortness of breath.   Cardiovascular: Negative for chest pain and claudication.  Gastrointestinal: Positive for abdominal pain, constipation and nausea. Negative for vomiting.  Genitourinary: Negative for dysuria and hematuria.  Musculoskeletal: Positive for myalgias. Negative for falls.  Skin:       Positive for color change of skin and open wound  Neurological: Positive for weakness. Negative for sensory change, speech change and loss of consciousness.  Endo/Heme/Allergies: Negative for environmental allergies and polydipsia.  Psychiatric/Behavioral: Positive for substance abuse and suicidal ideas.    Past Medical History:  Diagnosis Date  . ADHD (attention deficit hyperactivity disorder)   . Anxiety   . Arthritis   . Ascites   . Bipolar disorder (HCC)   . Cirrhosis (HCC)   . Hypokalemia   . Migraine   . Multiple sclerosis (HCC)   . Osteoporosis     Past Surgical History:  Procedure Laterality Date  . CESAREAN SECTION  [id number]  . MYRINGOTOMY WITH TUBE PLACEMENT Bilateral   . ORIF WRIST FRACTURE Left 09/09/2016   Procedure: OPEN REDUCTION INTERNAL FIXATION (ORIF) LEFT WRIST FRACTURE, LEFT CARPAL TUNNEL RELEASE;  Surgeon: Dominica Severin, MD;  Location: [facility];  Service: Orthopedics;  Laterality: Left;  . TONSILLECTOMY       reports that she has been smoking cigarettes.  She has a 20.00 pack-year smoking history. she has never used smokeless tobacco. She reports that she drinks about 12.6 oz of alcohol per week. She reports that she does not use drugs.  No  Known Allergies  Family History  Problem Relation Age of Onset  . Arrhythmia Mother   . Heart disease Father   . Hypertension Father     Prior to Admission medications   Medication Sig Start Date End Date Taking? Authorizing Provider  albuterol (PROVENTIL HFA;VENTOLIN HFA) 108 (90 Base) MCG/ACT inhaler Inhale 2 puffs into the lungs every 6 (six) hours as needed for wheezing or  shortness of breath.    [provider]  busPIRone (BUSPAR) 15 MG tablet TAKE 1 TABLET(15 MG) BY MOUTH TWICE DAILY Patient taking differently: Take 15 mg by mouth two times a day 04/19/16   [name], MD  cephALEXin (KEFLEX) 500 MG capsule Take 1 capsule (500 mg total) every 12 (twelve) hours by mouth. 12/26/16   Charm Rings, NP  FLUoxetine (PROZAC) 20 MG capsule Take 20 mg by mouth daily.  03/30/16   [provider]  Fluticasone-Salmeterol (ADVAIR) 100-50 MCG/DOSE AEPB Inhale 1-2 puffs 2 (two) times daily into the lungs.  11/05/14   [provider]  furosemide (LASIX) 40 MG tablet Take 1 tablet (40 mg total) by mouth daily. 06/17/16   Midge Minium, MD  gabapentin (NEURONTIN) 400 MG capsule Take 1 capsule (400 mg total) 3 (three) times daily by mouth. 12/26/16   Charm Rings, NP  Melatonin 3 MG TABS Take 1 tablet (3 mg total) by mouth at bedtime as needed (sleep). 10/24/15   Garth Bigness, MD  meloxicam (MOBIC) 7.5 MG tablet Take 7.5 mg by mouth daily. 11/27/16   [provider]  modafinil (PROVIGIL) 200 MG tablet Take 200 mg by mouth daily. 08/04/16   [provider]  ondansetron (ZOFRAN) 4 MG tablet Take 1 tablet (4 mg total) every 8 (eight) hours as needed by mouth for nausea or vomiting. 12/26/16   Charm Rings, NP  pantoprazole (PROTONIX) 40 MG tablet Take 1 tablet (40 mg total) by mouth daily. 12/30/15   Haney, Jeanann Lewandowsky, MD  rifaximin (XIFAXAN) 550 MG TABS tablet Take 1 tablet (550 mg total) by mouth 2 (two) times daily. 06/05/15   Catarina Hartshorn, MD  risperiDONE (RISPERDAL) 0.5 MG tablet Take 1 tablet (0.5 mg total) by mouth 3 (three) times daily as needed (agitation). Patient taking differently: Take 0.5 mg by mouth 2 (two) times daily.  02/06/16   Renne Musca, MD  rizatriptan (MAXALT) 10 MG tablet Take 1 tablet (10 mg total) by mouth as needed for migraine. May repeat in 2 hours if needed 12/06/16   Porfirio Oar, PA-C    spironolactone (ALDACTONE) 100 MG tablet Take 1 tablet (100 mg total) by mouth daily. 11/23/16   Derwood Kaplan, MD  sulfamethoxazole-trimethoprim (BACTRIM DS,SEPTRA DS) 800-160 MG tablet Take 1 tablet every 12 (twelve) hours by mouth. 12/26/16   Charm Rings, NP  topiramate (TOPAMAX) 100 MG tablet Take 1 tablet (100 mg total) by mouth daily. 01/07/16   [name], MD    Physical Exam:  Constitutional: NAD, calm, comfortable Vitals:   12/29/16 2110 12/29/16 2300 12/29/16 2315 12/29/16 2330  BP: 129/83  130/86 (!) 121/92  Pulse: 99 94 88   Resp: 18 (!) 24 (!) 22 (!) 23  Temp: 98.8 F (37.1 C)     SpO2: 93% 92% 94%    Eyes: PERRL, lids and conjunctivae normal ENMT: Mucous membranes are moist. Posterior pharynx clear of any exudate or lesions.Normal dentition.  Neck: normal, supple, no masses, no thyromegaly Respiratory: Increased respiratory rate, clear to auscultation  bilaterally, no wheezing, no crackles. Normal respiratory effort. No accessory muscle use.  Cardiovascular: Regular rate and rhythm, no murmurs / rubs / gallops. No extremity edema. 2+ pedal pulses. No carotid bruits.  Abdomen: Abdominal distension with positive fluid wave. Ventral wall hernia present with ulcerated lesion with herniation of suspected fatty tissue present with drainage noted and surrounding cellulitis   Musculoskeletal: no clubbing / cyanosis. No joint deformity upper and lower extremities. Good ROM, no contractures. Normal muscle tone.  Skin: Broad area of erythema noted surrounding the herniated lesion. Neurologic: CN 2-12 grossly intact. Sensation intact, DTR normal. Strength 5/5 in all 4. Tremor noted.  Psychiatric: Normal judgment and insight. Alert and oriented x 3. Anxious mood    Labs on Admission: I have personally reviewed following labs and imaging studies  CBC: Recent Labs  Lab 12/25/16 1828 12/29/16 2117  WBC 9.4 11.0*  HGB 11.7* 12.8  HCT 38.0 40.0  MCV 92.2 90.7   PLT 185 182   Basic Metabolic Panel: Recent Labs  Lab 12/25/16 1828 12/29/16 2117  NA 137 133*  K 3.9 2.6*  CL 106 87*  CO2 22 30  GLUCOSE 97 105*  BUN 7 <5*  CREATININE 0.44 0.65  CALCIUM 8.6* 8.6*   GFR: CrCl cannot be calculated (Unknown ideal weight.). Liver Function Tests: Recent Labs  Lab 12/25/16 1828 12/29/16 2117  AST 49* 41  ALT 25 22  ALKPHOS 137* 143*  BILITOT 0.8 1.5*  PROT 7.6 8.3*  ALBUMIN 3.9 4.2   Recent Labs  Lab 12/25/16 1828 12/29/16 2117  LIPASE 31 35   No results for input(s): AMMONIA in the last 168 hours. Coagulation Profile: Recent Labs  Lab 12/25/16 1911  INR 1.11   Cardiac Enzymes: No results for input(s): CKTOTAL, CKMB, CKMBINDEX, TROPONINI in the last 168 hours. BNP (last 3 results) No results for input(s): PROBNP in the last 8760 hours. HbA1C: No results for input(s): HGBA1C in the last 72 hours. CBG: No results for input(s): GLUCAP in the last 168 hours. Lipid Profile: No results for input(s): CHOL, HDL, LDLCALC, TRIG, CHOLHDL, LDLDIRECT in the last 72 hours. Thyroid Function Tests: No results for input(s): TSH, T4TOTAL, FREET4, T3FREE, THYROIDAB in the last 72 hours. Anemia Panel: No results for input(s): VITAMINB12, FOLATE, FERRITIN, TIBC, IRON, RETICCTPCT in the last 72 hours. Urine analysis:    Component Value Date/Time   COLORURINE STRAW (A) 12/29/2016 2130   APPEARANCEUR CLEAR 12/29/2016 2130   LABSPEC 1.005 12/29/2016 2130   PHURINE 7.0 12/29/2016 2130   GLUCOSEU NEGATIVE 12/29/2016 2130   HGBUR NEGATIVE 12/29/2016 2130   BILIRUBINUR NEGATIVE 12/29/2016 2130   BILIRUBINUR moderate (A) 06/19/2015 1906   KETONESUR NEGATIVE 12/29/2016 2130   PROTEINUR NEGATIVE 12/29/2016 2130   UROBILINOGEN >=8.0 06/19/2015 1906   UROBILINOGEN 0.2 09/14/2011 2141   NITRITE NEGATIVE 12/29/2016 2130   LEUKOCYTESUR NEGATIVE 12/29/2016 2130   Sepsis Labs: No results found for this or any previous visit (from the past 240  hour(s)).   Radiological Exams on Admission: No results found.  EKG: Independently reviewed. Sinus rythm  Assessment/Plan Abdominal wall cellulitis related with ventral hernia: Acute.  Patient with acute worsening of ventral wall hernia now with open wound and surrounding erythema and increased warmth.  Patient started on vancomycin.  - Admit to a telemetry bed - Cellulitis focused order set initiated - Follow-up blood cultures - Continue empiric antibiotics of vancomycin - Consulted General surgery Dr. Angelena Formhris White, and recommended keeping patient NPO and plan for surgery  in a.m.  Leukocytosis WBC elevated at 11.  Suspect secondary to the above. - Repeat CBC in a.m.  Hypokalemia: Acute.  Initial potassium noted to be 2.6 on admission.  Patient was given 40 mEq orally and 10 mEq IV while in the ED. - Check magnesium level - 40 mEq of potassium chloride IV - Continue to monitor and replace as needed  Alcohol Abuse: Patient reports last drink yesterday and reports normally drinking 4-5 glasses of wine at least per day. - CWIA Protocols with scheduled Ativan - Social work consult for alcohol abuse  Cirrhosis with varices secondary to alcoholism: MELD =9 and Meld-Na 14. - Patient likely to need outpatient GI follow-up  Suicidal Ideation:  Patient reports feeling hopeless and questions if she would be better off not alive, but does note a specific plan for self harm. - Sitter to bedside  History of multiple sclerosis: Patient followed at [facility] records seen on care everywhere.  Currently appears to be being treated with Vyvanse for related fatigue.   DVT prophylaxis: Lovenox    Code Status: Full  Family Communication: No family present at bedside Disposition Plan:  TBD Consults called: Surgery  Admission status: Inpatient  Clydie Braun MD [facility] Pager [phone]   If 7PM-7AM, please contact night-coverage [email or web address] Password [email or web address]  12/29/2016, 11:57 PM

## 2016-12-29 NOTE — ED Provider Notes (Signed)
[facility] Provider Note   CSN: [id number] Arrival date & time: 12/29/16  2059     History   Chief Complaint Chief Complaint  Patient presents with  . Abdominal Pain    HPI Anne Black is a 56 y.o. female.  This patient is a 56 year old female with past medical history of bipolar disorder, multiple sclerosis, ADHD, anxiety, chronic alcoholism, and chronic ventral hernia.  She presents today with complaints of "cramps all over", weakness, fatigue, decreased appetite, and feeling shaky.  She reports stopping drinking yesterday and is due to go into some sort of treatment facility in [place] on Friday, possibly Monday.  She denies any fevers or chills.  She denies any vomiting or diarrhea.  She does have a chronic large ventral hernia for which she is on antibiotics.  She has been seen in an emergency department recently on multiple occasions.  I personally treated her approximately 1 [facility] ago [facility] with a similar presentation.  Patient was very difficult to disposition and on several occasions insisted upon being admitted.  I was unable to find any clear medical reason for this and she was ultimately discharged.  She has since returned, then was discharged again.  Today she is reporting feeling depressed, but denies suicidal ideation and does not want to speak with any behavioral health representatives.   The history is provided by the patient.    Past Medical History:  Diagnosis Date  . ADHD (attention deficit hyperactivity disorder)   . Anxiety   . Arthritis   . Ascites   . Bipolar disorder (HCC)   . Cirrhosis (HCC)   . Hypokalemia   . Migraine   . Multiple sclerosis (HCC)   . Osteoporosis     Patient Active Problem List   Diagnosis Date Noted  . Distal radius fracture, left 09/09/2016  . Thrombocytopenia (HCC) 09/09/2016  . Chest x-ray abnormality   . Umbilical hernia without obstruction and without  gangrene 05/12/2016  . Itching 02/24/2016  . Ventral hernia without obstruction or gangrene 02/24/2016  . Palliative care encounter   . Ascites   . Tachypnea   . Typical atrial flutter (HCC)   . Hypomagnesemia   . Hypoxia   . Dehydration 01/22/2016  . Low back ache 12/30/2015  . Non-intractable vomiting with nausea   . Alcohol abuse with alcohol-induced mood disorder (HCC) 12/15/2015  . Acute alcoholism (HCC)   . Abdominal distension 11/04/2015  . Alcohol abuse   . Dyspnea   . Acute respiratory failure (HCC) 10/15/2015  . Ascites due to alcoholic cirrhosis (HCC) 10/15/2015  . Osteoporosis   . Bipolar I disorder (HCC)   . Multiple sclerosis (HCC)   . Alcoholic cirrhosis of liver without ascites (HCC) 08/22/2015  . Bipolar disorder, current episode mixed, moderate (HCC) 08/01/2015  . Alcohol use disorder, severe, dependence (HCC) 07/31/2015  . Macrocytic anemia- due to alcohol abuse with normal B12 & folate levels 05/31/2015  . Severe protein-calorie malnutrition (HCC) 05/31/2015  . Hypokalemia 05/26/2015  . Encephalopathy, hepatic (HCC) 05/26/2015  . Alcohol withdrawal (HCC) 05/18/2015  . Abdominal pain 05/18/2015  . Anxiety 05/18/2015  . UTI (lower urinary tract infection) 05/18/2015  . Stimulant abuse (HCC) 12/27/2014  . Nicotine abuse 12/27/2014  . Hyperprolactinemia (HCC) 11/15/2014  . Dyslipidemia   . Tobacco abuse 01/23/2014  . Noncompliance with therapeutic plan 04/04/2013  . Unspecified hereditary and idiopathic peripheral neuropathy 05/01/2012  . GERD (gastroesophageal reflux disease) 05/01/2012  .  Osteoarthrosis, unspecified whether generalized or localized, involving lower leg 05/01/2012  . Hyponatremia 07/01/2011    Past Surgical History:  Procedure Laterality Date  . CESAREAN SECTION  [id number]  . MYRINGOTOMY WITH TUBE PLACEMENT Bilateral   . ORIF WRIST FRACTURE Left 09/09/2016   Procedure: OPEN REDUCTION INTERNAL FIXATION (ORIF) LEFT WRIST FRACTURE, LEFT  CARPAL TUNNEL RELEASE;  Surgeon: Dominica Severin, MD;  Location: [facility];  Service: Orthopedics;  Laterality: Left;  . TONSILLECTOMY      OB History    No data available       Home Medications    Prior to Admission medications   Medication Sig Start Date End Date Taking? Authorizing Provider  albuterol (PROVENTIL HFA;VENTOLIN HFA) 108 (90 Base) MCG/ACT inhaler Inhale 2 puffs into the lungs every 6 (six) hours as needed for wheezing or shortness of breath.    [provider]  busPIRone (BUSPAR) 15 MG tablet TAKE 1 TABLET(15 MG) BY MOUTH TWICE DAILY Patient taking differently: Take 15 mg by mouth two times a day 04/19/16   [name], MD  cephALEXin (KEFLEX) 500 MG capsule Take 1 capsule (500 mg total) every 12 (twelve) hours by mouth. 12/26/16   Charm Rings, NP  FLUoxetine (PROZAC) 20 MG capsule Take 20 mg by mouth daily.  03/30/16   [provider]  Fluticasone-Salmeterol (ADVAIR) 100-50 MCG/DOSE AEPB Inhale 1-2 puffs 2 (two) times daily into the lungs.  11/05/14   [provider]  furosemide (LASIX) 40 MG tablet Take 1 tablet (40 mg total) by mouth daily. 06/17/16   Midge Minium, MD  gabapentin (NEURONTIN) 400 MG capsule Take 1 capsule (400 mg total) 3 (three) times daily by mouth. 12/26/16   Charm Rings, NP  Melatonin 3 MG TABS Take 1 tablet (3 mg total) by mouth at bedtime as needed (sleep). 10/24/15   Garth Bigness, MD  meloxicam (MOBIC) 7.5 MG tablet Take 7.5 mg by mouth daily. 11/27/16   [provider]  modafinil (PROVIGIL) 200 MG tablet Take 200 mg by mouth daily. 08/04/16   [provider]  ondansetron (ZOFRAN) 4 MG tablet Take 1 tablet (4 mg total) every 8 (eight) hours as needed by mouth for nausea or vomiting. 12/26/16   Charm Rings, NP  pantoprazole (PROTONIX) 40 MG tablet Take 1 tablet (40 mg total) by mouth daily. 12/30/15   Haney, Jeanann Lewandowsky, MD  rifaximin (XIFAXAN) 550 MG TABS tablet Take 1 tablet (550 mg  total) by mouth 2 (two) times daily. 06/05/15   Catarina Hartshorn, MD  risperiDONE (RISPERDAL) 0.5 MG tablet Take 1 tablet (0.5 mg total) by mouth 3 (three) times daily as needed (agitation). Patient taking differently: Take 0.5 mg by mouth 2 (two) times daily.  02/06/16   Renne Musca, MD  rizatriptan (MAXALT) 10 MG tablet Take 1 tablet (10 mg total) by mouth as needed for migraine. May repeat in 2 hours if needed 12/06/16   Porfirio Oar, PA-C  spironolactone (ALDACTONE) 100 MG tablet Take 1 tablet (100 mg total) by mouth daily. 11/23/16   Derwood Kaplan, MD  sulfamethoxazole-trimethoprim (BACTRIM DS,SEPTRA DS) 800-160 MG tablet Take 1 tablet every 12 (twelve) hours by mouth. 12/26/16   Charm Rings, NP  topiramate (TOPAMAX) 100 MG tablet Take 1 tablet (100 mg total) by mouth daily. 01/07/16   [name], MD    Family History Family History  Problem Relation Age of Onset  . Arrhythmia Mother   . Heart disease Father   .  Hypertension Father     Social History Social History   Tobacco Use  . Smoking status: Current Every Day Smoker    Packs/day: 1.00    Years: 20.00    Pack years: 20.00    Types: Cigarettes  . Smokeless tobacco: Never Used  . Tobacco comment: Currently Vaping Daily  Substance Use Topics  . Alcohol use: Yes    Alcohol/week: 12.6 oz    Types: 21 Glasses of wine per week  . Drug use: No     Allergies   Patient has no known allergies.   Review of Systems Review of Systems  All other systems reviewed and are negative.    Physical Exam Updated Vital Signs BP (!) 121/92   Pulse 88   Temp 98.8 F (37.1 C)   Resp (!) 23   SpO2 94%   Physical Exam  Constitutional: She is oriented to person, place, and time. She appears well-developed and well-nourished. No distress.  HENT:  Head: Normocephalic and atraumatic.  Mouth/Throat: Oropharynx is clear and moist.  Neck: Normal range of motion. Neck supple.  Cardiovascular: Normal rate and regular  rhythm. Exam reveals no gallop and no friction rub.  No murmur heard. Pulmonary/Chest: Effort normal and breath sounds normal. No respiratory distress. She has no wheezes.  Abdominal: Soft. Bowel sounds are normal. She exhibits no distension. There is no tenderness.  Musculoskeletal: Normal range of motion.  Neurological: She is alert and oriented to person, place, and time.  Skin: Skin is warm and dry. She is not diaphoretic.  Nursing note and vitals reviewed.    ED Treatments / Results  Labs (all labs ordered are listed, but only abnormal results are displayed) Labs Reviewed  COMPREHENSIVE METABOLIC PANEL - Abnormal; Notable for the following components:      Result Value   Sodium 133 (*)    Potassium 2.6 (*)    Chloride 87 (*)    Glucose, Bld 105 (*)    BUN <5 (*)    Calcium 8.6 (*)    Total Protein 8.3 (*)    Alkaline Phosphatase 143 (*)    Total Bilirubin 1.5 (*)    Anion gap 16 (*)    All other components within normal limits  ACETAMINOPHEN LEVEL - Abnormal; Notable for the following components:   Acetaminophen (Tylenol), Serum <10 (*)    All other components within normal limits  CBC - Abnormal; Notable for the following components:   WBC 11.0 (*)    All other components within normal limits  RAPID URINE DRUG SCREEN, HOSP PERFORMED - Abnormal; Notable for the following components:   Benzodiazepines POSITIVE (*)    All other components within normal limits  URINALYSIS, ROUTINE W REFLEX MICROSCOPIC - Abnormal; Notable for the following components:   Color, Urine STRAW (*)    All other components within normal limits  ETHANOL  SALICYLATE LEVEL  LIPASE, BLOOD  I-STAT BETA HCG BLOOD, ED ([facility], [facility], [facility] ONLY)    EKG  EKG Interpretation None       Radiology No results found.  Procedures Procedures (including critical care time)  Medications Ordered in ED Medications  sodium chloride 0.9 % bolus 1,000 mL (not administered)  ondansetron (ZOFRAN) injection 4 mg  (not administered)  hydrOXYzine (ATARAX/VISTARIL) tablet 25 mg (not administered)  potassium chloride 10 mEq in 100 mL IVPB (not administered)  potassium chloride SA (K-DUR,KLOR-CON) CR tablet 40 mEq (not administered)  ondansetron (ZOFRAN-ODT) disintegrating tablet 4 mg (4 mg Oral Given 12/29/16  2129)     Initial Impression / Assessment and Plan / ED Course  I have reviewed the triage vital signs and the nursing notes.  Pertinent labs & imaging results that were available during my care of the patient were reviewed by me and considered in my medical decision making (see chart for details).  Patient with multiple complaints, most notably and most urgent is that of what appears to be a significant sore to her ventral hernia with formation of what appears to be a pyogenic granuloma.  There is cellulitis extending the entire dimension of the hernia and into the abdominal wall.  She was given vancomycin for this.  Laboratory studies are unremarkable with the exception of a potassium of 2.6.  She was given both IV and oral replacement..  I feel as though she will require admission for repeat IV antibiotics.  I have discussed this with Dr. Katrinka Blazing who agrees to admit.  She also has multiple other issues including "spasms", abdominal pain, feeling anxious, and depression.  Final Clinical Impressions(s) / ED Diagnoses   Final diagnoses:  None    ED Discharge Orders    None       Geoffery Lyons, MD 12/30/16 [phone]

## 2016-12-29 NOTE — Telephone Encounter (Unsigned)
Copied from CRM [id number]. Topic: Inquiry >> Dec 29, 2016  3:36 PM Anice Paganini I, [place] wrote: Reason for CRM: Risperdal .

## 2016-12-29 NOTE — ED Notes (Signed)
Spoke with PA regarding possible SI thoughts, wait for doctor to evaluate

## 2016-12-30 ENCOUNTER — Inpatient Hospital Stay (HOSPITAL_COMMUNITY): Payer: 59

## 2016-12-30 ENCOUNTER — Observation Stay (HOSPITAL_COMMUNITY): Payer: 59 | Admitting: Anesthesiology

## 2016-12-30 ENCOUNTER — Encounter (HOSPITAL_COMMUNITY): Payer: Self-pay | Admitting: Certified Registered Nurse Anesthetist

## 2016-12-30 ENCOUNTER — Encounter (HOSPITAL_COMMUNITY)
Admission: EM | Disposition: A | Discharge status: Home Health | Payer: Self-pay | Source: Home / Self Care | Attending: Pulmonary Disease

## 2016-12-30 DIAGNOSIS — L03311 Cellulitis of abdominal wall: Secondary | ICD-10-CM | POA: Diagnosis present

## 2016-12-30 DIAGNOSIS — F1020 Alcohol dependence, uncomplicated: Secondary | ICD-10-CM

## 2016-12-30 DIAGNOSIS — E876 Hypokalemia: Secondary | ICD-10-CM | POA: Diagnosis present

## 2016-12-30 DIAGNOSIS — E872 Acidosis: Secondary | ICD-10-CM | POA: Diagnosis present

## 2016-12-30 DIAGNOSIS — Z9889 Other specified postprocedural states: Secondary | ICD-10-CM

## 2016-12-30 DIAGNOSIS — A419 Sepsis, unspecified organism: Secondary | ICD-10-CM

## 2016-12-30 DIAGNOSIS — G35 Multiple sclerosis: Secondary | ICD-10-CM | POA: Diagnosis present

## 2016-12-30 DIAGNOSIS — G609 Hereditary and idiopathic neuropathy, unspecified: Secondary | ICD-10-CM | POA: Diagnosis present

## 2016-12-30 DIAGNOSIS — K7031 Alcoholic cirrhosis of liver with ascites: Secondary | ICD-10-CM | POA: Diagnosis not present

## 2016-12-30 DIAGNOSIS — I864 Gastric varices: Secondary | ICD-10-CM | POA: Diagnosis present

## 2016-12-30 DIAGNOSIS — I700 Atherosclerosis of aorta: Secondary | ICD-10-CM | POA: Diagnosis present

## 2016-12-30 DIAGNOSIS — R45851 Suicidal ideations: Secondary | ICD-10-CM | POA: Diagnosis present

## 2016-12-30 DIAGNOSIS — K439 Ventral hernia without obstruction or gangrene: Secondary | ICD-10-CM | POA: Diagnosis not present

## 2016-12-30 DIAGNOSIS — G9349 Other encephalopathy: Secondary | ICD-10-CM | POA: Diagnosis not present

## 2016-12-30 DIAGNOSIS — K219 Gastro-esophageal reflux disease without esophagitis: Secondary | ICD-10-CM | POA: Diagnosis present

## 2016-12-30 DIAGNOSIS — R197 Diarrhea, unspecified: Secondary | ICD-10-CM | POA: Diagnosis not present

## 2016-12-30 DIAGNOSIS — M810 Age-related osteoporosis without current pathological fracture: Secondary | ICD-10-CM | POA: Diagnosis present

## 2016-12-30 DIAGNOSIS — G9340 Encephalopathy, unspecified: Secondary | ICD-10-CM | POA: Diagnosis not present

## 2016-12-30 DIAGNOSIS — F909 Attention-deficit hyperactivity disorder, unspecified type: Secondary | ICD-10-CM | POA: Diagnosis present

## 2016-12-30 DIAGNOSIS — I8500 Esophageal varices without bleeding: Secondary | ICD-10-CM | POA: Diagnosis present

## 2016-12-30 DIAGNOSIS — J9602 Acute respiratory failure with hypercapnia: Secondary | ICD-10-CM | POA: Diagnosis not present

## 2016-12-30 DIAGNOSIS — F17210 Nicotine dependence, cigarettes, uncomplicated: Secondary | ICD-10-CM | POA: Diagnosis present

## 2016-12-30 DIAGNOSIS — F319 Bipolar disorder, unspecified: Secondary | ICD-10-CM | POA: Diagnosis present

## 2016-12-30 DIAGNOSIS — J9601 Acute respiratory failure with hypoxia: Secondary | ICD-10-CM | POA: Diagnosis not present

## 2016-12-30 DIAGNOSIS — K420 Umbilical hernia with obstruction, without gangrene: Secondary | ICD-10-CM | POA: Diagnosis present

## 2016-12-30 DIAGNOSIS — F419 Anxiety disorder, unspecified: Secondary | ICD-10-CM | POA: Diagnosis present

## 2016-12-30 DIAGNOSIS — F10239 Alcohol dependence with withdrawal, unspecified: Secondary | ICD-10-CM | POA: Diagnosis present

## 2016-12-30 DIAGNOSIS — I480 Paroxysmal atrial fibrillation: Secondary | ICD-10-CM | POA: Diagnosis not present

## 2016-12-30 HISTORY — PX: ABDOMINAL WALL DEFECT REPAIR: SHX53

## 2016-12-30 HISTORY — PX: APPLICATION OF WOUND VAC: SHX5189

## 2016-12-30 LAB — PROTIME-INR
INR: 1.3
PROTHROMBIN TIME: 16.1 s — AB (ref 11.4–15.2)

## 2016-12-30 LAB — BASIC METABOLIC PANEL
ANION GAP: 9 (ref 5–15)
ANION GAP: 8 (ref 5–15)
BUN: <5 mg/dL — ABNORMAL LOW (ref 6–20)
BUN: <5 mg/dL — ABNORMAL LOW (ref 6–20)
CALCIUM: 8 mg/dL — AB (ref 8.9–10.3)
CHLORIDE: 95 mmol/L — AB (ref 101–111)
CO2: 30 mmol/L (ref 22–32)
CO2: 30 mmol/L (ref 22–32)
CREATININE: 0.46 mg/dL (ref 0.44–1.00)
Calcium: 8 mg/dL — ABNORMAL LOW (ref 8.9–10.3)
Chloride: 95 mmol/L — ABNORMAL LOW (ref 101–111)
Creatinine, Ser: 0.5 mg/dL (ref 0.44–1.00)
GFR calc non Af Amer: >60 mL/min (ref >60)
GFR calc non Af Amer: >60 mL/min (ref >60)
Glucose, Bld: 120 mg/dL — ABNORMAL HIGH (ref 65–99)
Glucose, Bld: 146 mg/dL — ABNORMAL HIGH (ref 65–99)
POTASSIUM: 2.8 mmol/L — AB (ref 3.5–5.1)
Potassium: 3 mmol/L — ABNORMAL LOW (ref 3.5–5.1)
Sodium: 134 mmol/L — ABNORMAL LOW (ref 135–145)
Sodium: 133 mmol/L — ABNORMAL LOW (ref 135–145)

## 2016-12-30 LAB — TYPE AND SCREEN
ABO/RH(D): A POS
ANTIBODY SCREEN: NEGATIVE

## 2016-12-30 LAB — CBC
HCT: 37.6 % (ref 36.0–46.0)
Hemoglobin: 12 g/dL (ref 12.0–15.0)
MCH: 28.8 pg (ref 26.0–34.0)
MCHC: 31.9 g/dL (ref 30.0–36.0)
MCV: 90.2 fL (ref 78.0–100.0)
PLATELETS: 154 10*3/uL (ref 150–400)
RBC: 4.17 MIL/uL (ref 3.87–5.11)
RDW: 14.8 % (ref 11.5–15.5)
WBC: 9 10*3/uL (ref 4.0–10.5)

## 2016-12-30 LAB — POCT I-STAT 3, ART BLOOD GAS (G3+)
Acid-Base Excess: 6 mmol/L — ABNORMAL HIGH (ref 0.0–2.0)
Acid-Base Excess: 8 mmol/L — ABNORMAL HIGH (ref 0.0–2.0)
BICARBONATE: 32.2 mmol/L — AB (ref 20.0–28.0)
Bicarbonate: 34.9 mmol/L — ABNORMAL HIGH (ref 20.0–28.0)
O2 SAT: 99 %
O2 Saturation: 98 %
PCO2 ART: 75.2 mmHg — AB (ref 32.0–48.0)
PCO2 ART: 41.4 mmHg (ref 32.0–48.0)
PH ART: 7.275 — AB (ref 7.350–7.450)
PO2 ART: 98 mmHg (ref 83.0–108.0)
Patient temperature: 98.6
TCO2: 37 mmol/L — ABNORMAL HIGH (ref 22–32)
TCO2: 33 mmol/L — AB (ref 22–32)
pH, Arterial: 7.499 — ABNORMAL HIGH (ref 7.350–7.450)
pO2, Arterial: 145 mmHg — ABNORMAL HIGH (ref 83.0–108.0)

## 2016-12-30 LAB — APTT: APTT: 34 s (ref 24–36)

## 2016-12-30 LAB — GLUCOSE, CAPILLARY
GLUCOSE-CAPILLARY: 100 mg/dL — AB (ref 65–99)
GLUCOSE-CAPILLARY: 134 mg/dL — AB (ref 65–99)
Glucose-Capillary: 138 mg/dL — ABNORMAL HIGH (ref 65–99)
Glucose-Capillary: 128 mg/dL — ABNORMAL HIGH (ref 65–99)

## 2016-12-30 LAB — ABO/RH: ABO/RH(D): A POS

## 2016-12-30 LAB — HEMOGLOBIN A1C
HEMOGLOBIN A1C: 5 % (ref 4.8–5.6)
Mean Plasma Glucose: 96.8 mg/dL

## 2016-12-30 LAB — TRIGLYCERIDES: TRIGLYCERIDES: 76 mg/dL (ref <150)

## 2016-12-30 LAB — LACTIC ACID, PLASMA: LACTIC ACID, VENOUS: 1.2 mmol/L (ref 0.5–1.9)

## 2016-12-30 LAB — PROCALCITONIN: Procalcitonin: <0.1 ng/mL

## 2016-12-30 LAB — MAGNESIUM
Magnesium: 1.4 mg/dL — ABNORMAL LOW (ref 1.7–2.4)
Magnesium: 2.1 mg/dL (ref 1.7–2.4)

## 2016-12-30 LAB — MRSA PCR SCREENING: MRSA by PCR: NEGATIVE

## 2016-12-30 SURGERY — REPAIR, ABDOMINAL WALL
Anesthesia: General | Site: Abdomen

## 2016-12-30 MED ORDER — CHLORHEXIDINE GLUCONATE 0.12% ORAL RINSE (MEDLINE KIT)
15.0000 mL | Freq: Two times a day (BID) | OROMUCOSAL | Status: DC
  Administered 2016-12-30 – 2016-12-31 (×2): 15 mL via OROMUCOSAL

## 2016-12-30 MED ORDER — LACTATED RINGERS IV SOLN
INTRAVENOUS | Status: DC | PRN
  Administered 2016-12-30: 10:00:00 via INTRAVENOUS

## 2016-12-30 MED ORDER — THIAMINE HCL 100 MG/ML IJ SOLN: 100.0000 mg | Freq: Every day | INTRAMUSCULAR | Status: DC

## 2016-12-30 MED ORDER — ALBUTEROL SULFATE HFA 108 (90 BASE) MCG/ACT IN AERS
INHALATION_SPRAY | RESPIRATORY_TRACT | Status: AC
  Filled 2016-12-30: qty 6.7

## 2016-12-30 MED ORDER — ONDANSETRON HCL 4 MG PO TABS: 4.0000 mg | ORAL_TABLET | Freq: Four times a day (QID) | ORAL | Status: DC | PRN

## 2016-12-30 MED ORDER — MAGNESIUM SULFATE 2 GM/50ML IV SOLN
2.0000 g | Freq: Once | INTRAVENOUS | Status: AC
  Administered 2016-12-30: 2 g via INTRAVENOUS
  Filled 2016-12-30: qty 50

## 2016-12-30 MED ORDER — ROCURONIUM BROMIDE 100 MG/10ML IV SOLN
INTRAVENOUS | Status: DC | PRN
  Administered 2016-12-30: 50 mg via INTRAVENOUS

## 2016-12-30 MED ORDER — FOLIC ACID 5 MG/ML IJ SOLN
1.0000 mg | Freq: Every day | INTRAMUSCULAR | Status: DC
  Administered 2016-12-30 – 2017-01-03 (×3): 1 mg via INTRAVENOUS
  Filled 2016-12-30 (×6): qty 0.2

## 2016-12-30 MED ORDER — VANCOMYCIN HCL IN DEXTROSE 750-5 MG/150ML-% IV SOLN
750.0000 mg | Freq: Two times a day (BID) | INTRAVENOUS | Status: DC
  Administered 2016-12-30 – 2017-01-01 (×4): 750 mg via INTRAVENOUS
  Filled 2016-12-30 (×4): qty 150

## 2016-12-30 MED ORDER — ONDANSETRON HCL 4 MG/2ML IJ SOLN: 4.0000 mg | Freq: Four times a day (QID) | INTRAMUSCULAR | Status: DC | PRN

## 2016-12-30 MED ORDER — PIPERACILLIN-TAZOBACTAM 3.375 G IVPB
3.3750 g | Freq: Three times a day (TID) | INTRAVENOUS | Status: DC
  Administered 2016-12-30 – 2017-01-04 (×17): 3.375 g via INTRAVENOUS
  Filled 2016-12-30 (×18): qty 50

## 2016-12-30 MED ORDER — ONDANSETRON HCL 4 MG/2ML IJ SOLN
INTRAMUSCULAR | Status: AC
  Filled 2016-12-30: qty 2

## 2016-12-30 MED ORDER — VITAMIN B-1 100 MG PO TABS: 100.0000 mg | ORAL_TABLET | Freq: Every day | ORAL | Status: DC

## 2016-12-30 MED ORDER — IPRATROPIUM-ALBUTEROL 0.5-2.5 (3) MG/3ML IN SOLN: 3.0000 mL | Freq: Four times a day (QID) | RESPIRATORY_TRACT | Status: DC | PRN

## 2016-12-30 MED ORDER — SUGAMMADEX SODIUM 200 MG/2ML IV SOLN
INTRAVENOUS | Status: AC
  Filled 2016-12-30: qty 2

## 2016-12-30 MED ORDER — PROPOFOL 10 MG/ML IV BOLUS
INTRAVENOUS | Status: AC
  Filled 2016-12-30: qty 20

## 2016-12-30 MED ORDER — ENOXAPARIN SODIUM 40 MG/0.4ML ~~LOC~~ SOLN: 40.0000 mg | SUBCUTANEOUS | Status: DC

## 2016-12-30 MED ORDER — DEXAMETHASONE SODIUM PHOSPHATE 10 MG/ML IJ SOLN
INTRAMUSCULAR | Status: DC | PRN
  Administered 2016-12-30: 10 mg via INTRAVENOUS

## 2016-12-30 MED ORDER — FOLIC ACID 1 MG PO TABS: 1.0000 mg | ORAL_TABLET | Freq: Every day | ORAL | Status: DC

## 2016-12-30 MED ORDER — VANCOMYCIN HCL IN DEXTROSE 750-5 MG/150ML-% IV SOLN
750.0000 mg | Freq: Two times a day (BID) | INTRAVENOUS | Status: DC
  Administered 2016-12-30: 750 mg via INTRAVENOUS
  Filled 2016-12-30: qty 150

## 2016-12-30 MED ORDER — INSULIN ASPART 100 UNIT/ML ~~LOC~~ SOLN
0.0000 [IU] | SUBCUTANEOUS | Status: DC
  Administered 2016-12-30 – 2017-01-03 (×5): 1 [IU] via SUBCUTANEOUS
  Administered 2017-01-04: 2 [IU] via SUBCUTANEOUS
  Administered 2017-01-05 (×2): 1 [IU] via SUBCUTANEOUS

## 2016-12-30 MED ORDER — POTASSIUM CHLORIDE 10 MEQ/100ML IV SOLN
10.0000 meq | INTRAVENOUS | Status: AC
Start: 2016-12-30 — End: 2016-12-30
  Administered 2016-12-30 (×3): 10 meq via INTRAVENOUS
  Filled 2016-12-30 (×3): qty 100

## 2016-12-30 MED ORDER — LIDOCAINE HCL (CARDIAC) 20 MG/ML IV SOLN
INTRAVENOUS | Status: DC | PRN
  Administered 2016-12-30: 100 mg via INTRAVENOUS

## 2016-12-30 MED ORDER — SODIUM CHLORIDE 0.9 % IV BOLUS (SEPSIS): 250.0000 mL | Freq: Once | INTRAVENOUS | Status: DC

## 2016-12-30 MED ORDER — NICOTINE 14 MG/24HR TD PT24
14.0000 mg | MEDICATED_PATCH | Freq: Every day | TRANSDERMAL | Status: DC
  Administered 2016-12-30 – 2017-01-11 (×13): 14 mg via TRANSDERMAL
  Filled 2016-12-30 (×14): qty 1

## 2016-12-30 MED ORDER — ALBUTEROL SULFATE HFA 108 (90 BASE) MCG/ACT IN AERS
INHALATION_SPRAY | RESPIRATORY_TRACT | Status: DC | PRN
  Administered 2016-12-30 (×3): 4 via RESPIRATORY_TRACT

## 2016-12-30 MED ORDER — MIDAZOLAM HCL 2 MG/2ML IJ SOLN
INTRAMUSCULAR | Status: AC
Start: 2016-12-30 — End: ?
  Filled 2016-12-30: qty 2

## 2016-12-30 MED ORDER — PROPOFOL 1000 MG/100ML IV EMUL
5.0000 ug/kg/min | INTRAVENOUS | Status: DC
  Administered 2016-12-30: 40 ug/kg/min via INTRAVENOUS
  Filled 2016-12-30: qty 100

## 2016-12-30 MED ORDER — ONDANSETRON 4 MG PO TBDP
4.0000 mg | ORAL_TABLET | Freq: Four times a day (QID) | ORAL | Status: DC | PRN
  Administered 2017-01-03 – 2017-01-11 (×3): 4 mg via ORAL
  Filled 2016-12-30 (×4): qty 1

## 2016-12-30 MED ORDER — FENTANYL CITRATE (PF) 100 MCG/2ML IJ SOLN
INTRAMUSCULAR | Status: DC | PRN
  Administered 2016-12-30: 50 ug via INTRAVENOUS
  Administered 2016-12-30: 100 ug via INTRAVENOUS

## 2016-12-30 MED ORDER — FENTANYL 2500MCG IN NS 250ML (10MCG/ML) PREMIX INFUSION
0.0000 ug/h | INTRAVENOUS | Status: DC
  Administered 2016-12-30: 50 ug/h via INTRAVENOUS
  Administered 2016-12-30 – 2016-12-31 (×3): 400 ug/h via INTRAVENOUS
  Filled 2016-12-30 (×4): qty 250

## 2016-12-30 MED ORDER — LIDOCAINE 2% (20 MG/ML) 5 ML SYRINGE
INTRAMUSCULAR | Status: AC
  Filled 2016-12-30: qty 5

## 2016-12-30 MED ORDER — PHENYLEPHRINE 40 MCG/ML (10ML) SYRINGE FOR IV PUSH (FOR BLOOD PRESSURE SUPPORT)
PREFILLED_SYRINGE | INTRAVENOUS | Status: AC
  Filled 2016-12-30: qty 10

## 2016-12-30 MED ORDER — LORAZEPAM 2 MG/ML IJ SOLN: 0.0000 mg | Freq: Two times a day (BID) | INTRAMUSCULAR | Status: DC

## 2016-12-30 MED ORDER — FENTANYL BOLUS VIA INFUSION
25.0000 ug | INTRAVENOUS | Status: DC | PRN
  Administered 2016-12-30 (×4): 50 ug via INTRAVENOUS
  Filled 2016-12-30: qty 50

## 2016-12-30 MED ORDER — POTASSIUM CHLORIDE 10 MEQ/100ML IV SOLN
10.0000 meq | INTRAVENOUS | Status: AC
  Administered 2016-12-30 (×6): 10 meq via INTRAVENOUS
  Filled 2016-12-30 (×6): qty 100

## 2016-12-30 MED ORDER — ONDANSETRON HCL 4 MG/2ML IJ SOLN: 4.0000 mg | Freq: Once | INTRAMUSCULAR | Status: DC | PRN

## 2016-12-30 MED ORDER — MIDAZOLAM HCL 2 MG/2ML IJ SOLN
INTRAMUSCULAR | Status: AC
  Filled 2016-12-30: qty 2

## 2016-12-30 MED ORDER — ROCURONIUM BROMIDE 10 MG/ML (PF) SYRINGE
PREFILLED_SYRINGE | INTRAVENOUS | Status: AC
  Filled 2016-12-30: qty 5

## 2016-12-30 MED ORDER — 0.9 % SODIUM CHLORIDE (POUR BTL) OPTIME
TOPICAL | Status: DC | PRN
  Administered 2016-12-30: 1000 mL

## 2016-12-30 MED ORDER — ADULT MULTIVITAMIN W/MINERALS CH: 1.0000 | ORAL_TABLET | Freq: Every day | ORAL | Status: DC

## 2016-12-30 MED ORDER — FENTANYL CITRATE (PF) 250 MCG/5ML IJ SOLN
INTRAMUSCULAR | Status: AC
  Filled 2016-12-30: qty 5

## 2016-12-30 MED ORDER — ORAL CARE MOUTH RINSE
15.0000 mL | Freq: Four times a day (QID) | OROMUCOSAL | Status: DC
  Administered 2016-12-30: 15 mL via OROMUCOSAL

## 2016-12-30 MED ORDER — LORAZEPAM 1 MG PO TABS: 1.0000 mg | ORAL_TABLET | Freq: Four times a day (QID) | ORAL | Status: DC | PRN

## 2016-12-30 MED ORDER — LORAZEPAM 2 MG/ML IJ SOLN
0.0000 mg | Freq: Four times a day (QID) | INTRAMUSCULAR | Status: DC
  Administered 2016-12-30: 2 mg via INTRAVENOUS
  Filled 2016-12-30: qty 1

## 2016-12-30 MED ORDER — HYDROCORTISONE NA SUCCINATE PF 250 MG IJ SOLR
INTRAMUSCULAR | Status: AC
  Filled 2016-12-30: qty 250

## 2016-12-30 MED ORDER — MIDAZOLAM HCL 5 MG/5ML IJ SOLN
INTRAMUSCULAR | Status: DC | PRN
  Administered 2016-12-30: 2 mg via INTRAVENOUS

## 2016-12-30 MED ORDER — LACTATED RINGERS IV SOLN
INTRAVENOUS | Status: DC | PRN
  Administered 2016-12-30: 11:00:00 via INTRAVENOUS

## 2016-12-30 MED ORDER — FENTANYL CITRATE (PF) 100 MCG/2ML IJ SOLN
25.0000 ug | INTRAMUSCULAR | Status: DC | PRN
  Administered 2016-12-30 – 2016-12-31 (×2): 50 ug via INTRAVENOUS
  Filled 2016-12-30: qty 2

## 2016-12-30 MED ORDER — VANCOMYCIN HCL IN DEXTROSE 1-5 GM/200ML-% IV SOLN
1000.0000 mg | Freq: Once | INTRAVENOUS | Status: AC
  Administered 2016-12-30: 1000 mg via INTRAVENOUS
  Filled 2016-12-30: qty 200

## 2016-12-30 MED ORDER — SUGAMMADEX SODIUM 200 MG/2ML IV SOLN
INTRAVENOUS | Status: DC | PRN
  Administered 2016-12-30: 200 mg via INTRAVENOUS

## 2016-12-30 MED ORDER — SODIUM CHLORIDE 0.9 % IV SOLN
INTRAVENOUS | Status: DC
  Administered 2016-12-30 – 2017-01-03 (×3): via INTRAVENOUS

## 2016-12-30 MED ORDER — PROPOFOL 10 MG/ML IV BOLUS
INTRAVENOUS | Status: DC | PRN
  Administered 2016-12-30: 150 mg via INTRAVENOUS
  Administered 2016-12-30: 50 mg via INTRAVENOUS

## 2016-12-30 MED ORDER — ONDANSETRON HCL 4 MG/2ML IJ SOLN
INTRAMUSCULAR | Status: DC | PRN
  Administered 2016-12-30: 4 mg via INTRAVENOUS

## 2016-12-30 MED ORDER — MORPHINE SULFATE (PF) 4 MG/ML IV SOLN
2.0000 mg | INTRAVENOUS | Status: AC
  Administered 2016-12-30: 2 mg via INTRAVENOUS
  Filled 2016-12-30: qty 1

## 2016-12-30 MED ORDER — DEXAMETHASONE SODIUM PHOSPHATE 10 MG/ML IJ SOLN
INTRAMUSCULAR | Status: AC
  Filled 2016-12-30: qty 1

## 2016-12-30 MED ORDER — ORAL CARE MOUTH RINSE
15.0000 mL | OROMUCOSAL | Status: DC
  Administered 2016-12-30 – 2016-12-31 (×5): 15 mL via OROMUCOSAL

## 2016-12-30 MED ORDER — HYDROCORTISONE NA SUCCINATE PF 100 MG IJ SOLR
INTRAMUSCULAR | Status: DC | PRN
  Administered 2016-12-30: 125 mg via INTRAVENOUS

## 2016-12-30 MED ORDER — PANTOPRAZOLE SODIUM 40 MG IV SOLR: 40.0000 mg | Freq: Two times a day (BID) | INTRAVENOUS | Status: DC

## 2016-12-30 MED ORDER — CHLORHEXIDINE GLUCONATE 0.12% ORAL RINSE (MEDLINE KIT)
15.0000 mL | Freq: Two times a day (BID) | OROMUCOSAL | Status: DC
  Administered 2016-12-30: 15 mL via OROMUCOSAL

## 2016-12-30 MED ORDER — PANTOPRAZOLE SODIUM 40 MG IV SOLR
40.0000 mg | INTRAVENOUS | Status: DC
  Administered 2016-12-30 – 2017-01-02 (×4): 40 mg via INTRAVENOUS
  Filled 2016-12-30 (×5): qty 40

## 2016-12-30 MED ORDER — MIDAZOLAM HCL 2 MG/2ML IJ SOLN
1.0000 mg | INTRAMUSCULAR | Status: DC | PRN
  Administered 2016-12-30: 4 mg via INTRAVENOUS
  Administered 2016-12-30 (×2): 2 mg via INTRAVENOUS
  Administered 2016-12-31 (×3): 4 mg via INTRAVENOUS
  Administered 2016-12-31 (×2): 2 mg via INTRAVENOUS
  Filled 2016-12-30 (×2): qty 4
  Filled 2016-12-30: qty 2
  Filled 2016-12-30: qty 4
  Filled 2016-12-30 (×2): qty 2
  Filled 2016-12-30: qty 4

## 2016-12-30 MED ORDER — PROPOFOL 1000 MG/100ML IV EMUL
INTRAVENOUS | Status: AC
  Filled 2016-12-30: qty 100

## 2016-12-30 MED ORDER — PROPOFOL 500 MG/50ML IV EMUL
INTRAVENOUS | Status: DC | PRN
  Administered 2016-12-30: 30 ug/kg/min via INTRAVENOUS

## 2016-12-30 MED ORDER — PHENYLEPHRINE 40 MCG/ML (10ML) SYRINGE FOR IV PUSH (FOR BLOOD PRESSURE SUPPORT)
PREFILLED_SYRINGE | INTRAVENOUS | Status: DC | PRN
  Administered 2016-12-30 (×3): 80 ug via INTRAVENOUS

## 2016-12-30 MED ORDER — LORAZEPAM 2 MG/ML IJ SOLN: 1.0000 mg | Freq: Four times a day (QID) | INTRAMUSCULAR | Status: DC | PRN

## 2016-12-30 MED ORDER — ONDANSETRON HCL 4 MG/2ML IJ SOLN
4.0000 mg | Freq: Four times a day (QID) | INTRAMUSCULAR | Status: DC | PRN
  Administered 2017-01-05 – 2017-01-06 (×3): 4 mg via INTRAVENOUS
  Filled 2016-12-30 (×3): qty 2

## 2016-12-30 SURGICAL SUPPLY — 34 items
CANISTER SUCT 3000ML PPV (MISCELLANEOUS) ×3 IMPLANT
CANISTER WOUND CARE 500ML ATS (WOUND CARE) ×3 IMPLANT
CHLORAPREP W/TINT 26ML (MISCELLANEOUS) ×3 IMPLANT
COVER SURGICAL LIGHT HANDLE (MISCELLANEOUS) ×3 IMPLANT
DECANTER SPIKE VIAL GLASS SM (MISCELLANEOUS) ×3 IMPLANT
DRAPE LAPAROSCOPIC ABDOMINAL (DRAPES) ×3 IMPLANT
DRSG VAC ATS MED SENSATRAC (GAUZE/BANDAGES/DRESSINGS) ×3 IMPLANT
DURAPREP 26ML APPLICATOR (WOUND CARE) ×3 IMPLANT
ELECT CAUTERY BLADE 6.4 (BLADE) ×3 IMPLANT
ELECT REM PT RETURN 9FT ADLT (ELECTROSURGICAL) ×3
ELECTRODE REM PT RTRN 9FT ADLT (ELECTROSURGICAL) ×1 IMPLANT
GLOVE BIO SURGEON STRL SZ7 (GLOVE) ×3 IMPLANT
GLOVE BIOGEL PI IND STRL 6.5 (GLOVE) ×1 IMPLANT
GLOVE BIOGEL PI IND STRL 7.0 (GLOVE) ×1 IMPLANT
GLOVE BIOGEL PI IND STRL 7.5 (GLOVE) ×1 IMPLANT
GLOVE BIOGEL PI IND STRL 8.5 (GLOVE) ×1 IMPLANT
GLOVE BIOGEL PI INDICATOR 6.5 (GLOVE) ×2
GLOVE BIOGEL PI INDICATOR 7.0 (GLOVE) ×2
GLOVE BIOGEL PI INDICATOR 7.5 (GLOVE) ×2
GLOVE BIOGEL PI INDICATOR 8.5 (GLOVE) ×2
GLOVE ECLIPSE 6.5 STRL STRAW (GLOVE) ×3 IMPLANT
GOWN STRL REUS W/ TWL LRG LVL3 (GOWN DISPOSABLE) ×3 IMPLANT
GOWN STRL REUS W/TWL LRG LVL3 (GOWN DISPOSABLE) ×6
HANDLE SUCTION POOLE (INSTRUMENTS) ×1 IMPLANT
KIT BASIN OR (CUSTOM PROCEDURE TRAY) ×3 IMPLANT
KIT ROOM TURNOVER OR (KITS) ×3 IMPLANT
NS IRRIG 1000ML POUR BTL (IV SOLUTION) ×3 IMPLANT
PACK GENERAL/GYN (CUSTOM PROCEDURE TRAY) ×3 IMPLANT
PAD ARMBOARD 7.5X6 YLW CONV (MISCELLANEOUS) ×3 IMPLANT
SUCTION POOLE HANDLE (INSTRUMENTS) ×3
SUT ETHIBOND NAB CT1 #1 30IN (SUTURE) ×9 IMPLANT
SUT SILK 2 0 SH CR/8 (SUTURE) ×3 IMPLANT
SUT SILK 2 0 TIES 10X30 (SUTURE) ×6 IMPLANT
SUT SILK 3 0 TIES 10X30 (SUTURE) ×3 IMPLANT

## 2016-12-30 NOTE — ED Notes (Signed)
Sent add on label to main lab for APTT and INR.

## 2016-12-30 NOTE — ED Notes (Signed)
Delay with 2nd set of blood cultures,  General surgeon at bedside.

## 2016-12-30 NOTE — ED Notes (Addendum)
Pt begging for pain medication and solumedrol. C/o "spasms" to side of abd area bilat. Pt also wanting Librium for "detox". States that her last drink was yesterday. Pt crying in room. "I just don't know if I can keep on with life this way". Ask pt if she is suicidal, pt states "I don't know" "Everyone has these thoughts" "I just need something for the pain and I will be better". Pt begging for muscle relaxer also.  States "all of this will help me feel better". Pt complaining that the admitting md is taking too long because she wants pain medication. Pt repeating herself. Pt stating "I want a [place] sandwich now" "you can do that later" "get me a sandwich now". Pt stating "maybe the admitting md will call the neurologist on call at [facility] and talk to him". "Do you think he will do that?". "Can you call the neurologist on call at [facility] and tell him I'm here and let him tell you something to give me for pain?" Will speak with md.

## 2016-12-30 NOTE — Progress Notes (Signed)
Pharmacy Antibiotic Note  Anne Black is a 56 y.o. female admitted on 12/29/2016 with cellulitis.  Pharmacy has been consulted for Vancomycin dosing. WBC 11. Renal function ok.   Plan: Vancomycin 750 mg IV q12h Trend WBC, temp, renal function  F/U infectious work-up Drug levels as indicated  Temp (24hrs), Avg:98.8 F (37.1 C), Min:98.8 F (37.1 C), Max:98.8 F (37.1 C)  Recent Labs  Lab 12/25/16 1828 12/29/16 2117  WBC 9.4 11.0*  CREATININE 0.44 0.65    CrCl cannot be calculated (Unknown ideal weight.).    No Known Allergies   Anne Black 12/30/2016 1:20 AM

## 2016-12-30 NOTE — Progress Notes (Signed)
Pharmacy Antibiotic Note  Anne Black is a 56 y.o. female admitted on 12/29/2016 with sepsis and wound infection.  Pharmacy has been consulted for vancomycin and zosyn dosing. Pt is afebrile and WBC is WNL. SCr is WNL at 0.46. Vancomycin started earlier today and now adding zosyn. S/p ex lap with omental resection, hernia repair and placement of abdomen VAC.   Plan: Continue vanc 750mg  IV Q12H Zosyn 3.375gm IV Q8H (4 hr inf) F/u renal fxn, C&S, clinical status and trough at SS  Height: 5\' 2"  (157.5 cm) Weight: 149 lb 11.1 oz (67.9 kg)(per outpt visit) IBW/kg (Calculated) : 50.1  Temp (24hrs), Avg:98.3 F (36.8 C), Min:97.7 F (36.5 C), Max:98.8 F (37.1 C)  Recent Labs  Lab 12/25/16 1828 12/29/16 2117 12/30/16 0126 12/30/16 0909  WBC 9.4 11.0* 9.0  --   CREATININE 0.44 0.65  --  0.46    Estimated Creatinine Clearance: 70.1 mL/min (by C-G formula based on SCr of 0.46 mg/dL).    No Known Allergies  Antimicrobials this admission: Vanc 11/22>> Zosyn 11/22>>  Dose adjustments this admission: N/A  Microbiology results: Pending  Thank you for allowing pharmacy to be a part of this patient's care.  Boy Delamater, Drake LeachRachel Lynn 12/30/2016 12:32 PM

## 2016-12-30 NOTE — Op Note (Signed)
Preoperative diagnosis: incarcerated umbilical hernia with eviscerated omentum Postoperative diagnosis: Same as above Procedure: Exploratory laparotomy with omental resection Primary umbilical hernia repair with sutures Placement of abdominal VAC Surgeon: Dr. Harden MoMatt Braelin Brosch Anesthesia: General Estimated blood loss: 200 cc Drains: None Specimens: Abdominal wall, hernia sac, omentum to pathology patient drainage of remaining complications: None Sponge needle count was correct at completion Disposition to recovery in stable condition  Indications: This is a 56 year old female who presents she was in the emergency room a week prior to this with child's a cirrhosis and a known incarcerated umbilical hernia. with a scab overlying the hernia sac.  Since then this has broken down and she now has a piece of omentum that has eviscerated from the hernia sac and accompanying abdominal wall cellulitis.  I discussed urgent laparotomy with primary repair of the hernia.  Procedure: After informed consent was obtained the patient was taken to the operating room.  She had received antibiotics.  SCDs were in place.  She was placed under general anesthesia without complication.    An orogastric tube and a Foley were placed.  She was prepped and draped in the standard sterile surgical fashion.  Surgical timeout was then performed.  I made an elliptical incision to encompass all the thinned out skin overlying the hernia.  I used cautery to dissect down to the fascia.  She had a number of very large veins in her abdominal wall in  accordance with her cirrhosis.  I tied a number of these off with silk suture as well as using cautery on a number of these as well.  Eventually I  was able to enter the hernia sac.  There was only omentum in the hernia.  She had a fair amount of ascites that was drained when I entered the hernia sac.  I then proceeded to excise the hernia sac as well as all the accompanying thinned out skin  overlying the incarcerated hernia.  There was no bowel involved.  I then resected the portion of the omentum that was incarcerated.  Her omentum had fairly large veins consistent with her cirrhosis as well.  These were all tied off with 2-0 silk sutures.  This was then placed back in the abdomen.  Due to the fact that this was incarcerated and she has a cellulitis I elected to repair this primarily.  I used #1 Ethibond sutures to close this defect primarily without any tension.  Hemostasis was then obtained.  I then elected to place a VAC sponge over this and not close the wound.  This was functional upon completion.  She was not able to be extubated in the operating room and she will be transferred to the ICU in stable condition.

## 2016-12-30 NOTE — Consult Note (Signed)
PULMONARY / CRITICAL CARE MEDICINE   Name: Anne Black MRN: [id number] DOB: 11/27/1960    ADMISSION DATE:  12/29/2016   CONSULTATION DATE:  12/30/2016  REFERRING MD:  [name], M.D. / CCS  CHIEF COMPLAINT:  Sepsis  HISTORY OF PRESENT ILLNESS:  56 y.o. female with long-standing history of alcohol abuse and underlying liver cirrhosis with history of ascites and paracentesis. Patient also has known multiple sclerosis followed at [facility] as well as bipolar disorder. Presented yesterday to hospital with 2 day history of body cramps. Also endorses generalized malaise, fatigue, decreased appetite, and nausea. Patient endorsed drinking 4-5 glasses of wine daily on average with last alcoholic beverage on 11/20. Abdominal CT imaging in the emergency department on 11/11 showed a ventral wall hernia with surrounding edema. Patient endorsed new purulent drainage from open wound and surrounding erythema from abdominal wall hernia. Patient also endorsed constant pain. Was unaware of her last bowel movement or flatus. Patient taken to the operating room today for exploratory laparotomy. Patient underwent omental resection as well as repair of umbilical hernia. Returns to the ICU with abdominal wound VAC in place and on ventilator support.  PAST MEDICAL HISTORY :  Past Medical History:  Diagnosis Date  . ADHD (attention deficit hyperactivity disorder)   . Anxiety   . Arthritis   . Ascites   . Bipolar disorder (HCC)   . Cirrhosis (HCC)   . Hypokalemia   . Migraine   . Multiple sclerosis (HCC)   . Osteoporosis    PAST SURGICAL HISTORY: Past Surgical History:  Procedure Laterality Date  . CESAREAN SECTION  [id number]  . MYRINGOTOMY WITH TUBE PLACEMENT Bilateral   . ORIF WRIST FRACTURE Left 09/09/2016   Procedure: OPEN REDUCTION INTERNAL FIXATION (ORIF) LEFT WRIST FRACTURE, LEFT CARPAL TUNNEL RELEASE;  Surgeon: Dominica Severin, MD;  Location: [facility];  Service: Orthopedics;  Laterality: Left;  .  TONSILLECTOMY     No Known Allergies  No current facility-administered medications on file prior to encounter.    Current Outpatient Medications on File Prior to Encounter  Medication Sig  . albuterol (PROVENTIL HFA;VENTOLIN HFA) 108 (90 Base) MCG/ACT inhaler Inhale 2 puffs into the lungs every 6 (six) hours as needed for wheezing or shortness of breath.  . busPIRone (BUSPAR) 15 MG tablet TAKE 1 TABLET(15 MG) BY MOUTH TWICE DAILY (Patient taking differently: Take 15 mg by mouth two times a day)  . cephALEXin (KEFLEX) 500 MG capsule Take 1 capsule (500 mg total) every 12 (twelve) hours by mouth.  Marland Kitchen FLUoxetine (PROZAC) 20 MG capsule Take 20 mg by mouth daily.   . Fluticasone-Salmeterol (ADVAIR) 100-50 MCG/DOSE AEPB Inhale 1-2 puffs 2 (two) times daily into the lungs.   . furosemide (LASIX) 40 MG tablet Take 1 tablet (40 mg total) by mouth daily.  Marland Kitchen gabapentin (NEURONTIN) 400 MG capsule Take 1 capsule (400 mg total) 3 (three) times daily by mouth.  . Melatonin 3 MG TABS Take 1 tablet (3 mg total) by mouth at bedtime as needed (sleep).  . meloxicam (MOBIC) 7.5 MG tablet Take 7.5 mg by mouth daily.  . modafinil (PROVIGIL) 200 MG tablet Take 200 mg by mouth daily.  . ondansetron (ZOFRAN) 4 MG tablet Take 1 tablet (4 mg total) every 8 (eight) hours as needed by mouth for nausea or vomiting.  . pantoprazole (PROTONIX) 40 MG tablet Take 1 tablet (40 mg total) by mouth daily.  . rifaximin (XIFAXAN) 550 MG TABS tablet Take 1 tablet (550 mg total) by  mouth 2 (two) times daily.  . risperiDONE (RISPERDAL) 0.5 MG tablet Take 1 tablet (0.5 mg total) by mouth 3 (three) times daily as needed (agitation). (Patient taking differently: Take 0.5 mg by mouth 2 (two) times daily. )  . rizatriptan (MAXALT) 10 MG tablet Take 1 tablet (10 mg total) by mouth as needed for migraine. May repeat in 2 hours if needed  . spironolactone (ALDACTONE) 100 MG tablet Take 1 tablet (100 mg total) by mouth daily.  Marland Kitchen.  sulfamethoxazole-trimethoprim (BACTRIM DS,SEPTRA DS) 800-160 MG tablet Take 1 tablet every 12 (twelve) hours by mouth.  . topiramate (TOPAMAX) 100 MG tablet Take 1 tablet (100 mg total) by mouth daily.    FAMILY HISTORY:  Family History  Problem Relation Age of Onset  . Arrhythmia Mother   . Heart disease Father   . Hypertension Father     SOCIAL HISTORY: Social History   Socioeconomic History  . Marital status: Divorced    Spouse name: n/a  . Number of children: 5  . Years of education: College  . Highest education level: None  Social Needs  . Financial resource strain: None  . Food insecurity - worry: None  . Food insecurity - inability: None  . Transportation needs - medical: None  . Transportation needs - non-medical: None  Occupational History  . Occupation: disabled    Comment: on social security disability for MS  Tobacco Use  . Smoking status: Current Every Day Smoker    Packs/day: 1.00    Years: 20.00    Pack years: 20.00    Types: Cigarettes  . Smokeless tobacco: Never Used  . Tobacco comment: Currently Vaping Daily  Substance and Sexual Activity  . Alcohol use: Yes    Alcohol/week: 12.6 oz    Types: 21 Glasses of wine per week  . Drug use: No  . Sexual activity: Not Currently  Other Topics Concern  . None  Social History Narrative   Former Environmental consultantpharmaceutical representative.   Started graduate school in counseling, but didn't finish when a good job came open.   Lives alone.   One daughter lives in [place].   Another daughter is in [place].   The other three live in [place], along with her mother.   Father lives in the [place] area.   REVIEW OF SYSTEMS:  A pertinent 14 point review of systems is negative except as per the history of presenting illness.  SUBJECTIVE: As above.  VITAL SIGNS: BP (!) 106/58   Pulse 77   Temp 98.8 F (37.1 C)   Resp 14   SpO2 97%   HEMODYNAMICS:    VENTILATOR SETTINGS:    INTAKE / OUTPUT: I/O last 3  completed shifts: In: 1250 [IV Piggyback:1250] Out: -   PHYSICAL EXAMINATION: General:  Sedated. No acute distress. No family at bedside.  Integument:  Warm & dry. No rash on exposed skin. Abdominal wound vac in place. Extremities:  No cyanosis or clubbing.  Lymphatics:  No appreciated cervical or supraclavicular lymphadenoapthy. HEENT:  Moist mucus membranes. No oral ulcers. No scleral injection. Endotracheal tube in place.  Cardiovascular:  Regular rate. No edema. No appreciable JVD.  Pulmonary:  Good aeration & clear to auscultation bilaterally. Symmetric chest wall expansion on ventilator. Abdomen: Soft. Normal bowel sounds. Nondistended.  Musculoskeletal:  Normal bulk and tone. No joint deformity or effusion appreciated. Neurological:  Pupils symmetric and reactive. Sedated on propofol. Psychiatric:  Unable to assess with sedation.   LABS:  BMET Recent Labs  Lab 12/25/16 1828 12/29/16 2117 12/30/16 0909  NA 137 133* 134*  K 3.9 2.6* 2.8*  CL 106 87* 95*  CO2 22 30 30   BUN 7 <5* <5*  CREATININE 0.44 0.65 0.46  GLUCOSE 97 105* 120*    Electrolytes Recent Labs  Lab 12/25/16 1828 12/29/16 2117 12/30/16 0909  CALCIUM 8.6* 8.6* 8.0*  MG  --  1.4* 2.1    CBC Recent Labs  Lab 12/25/16 1828 12/29/16 2117 12/30/16 0126  WBC 9.4 11.0* 9.0  HGB 11.7* 12.8 12.0  HCT 38.0 40.0 37.6  PLT 185 182 154    Coag's Recent Labs  Lab 12/25/16 1911 12/30/16 0415  APTT  --  34  INR 1.11 1.30    Sepsis Markers No results for input(s): LATICACIDVEN, PROCALCITON, O2SATVEN in the last 168 hours.  ABG No results for input(s): PHART, PCO2ART, PO2ART in the last 168 hours.  Liver Enzymes Recent Labs  Lab 12/25/16 1828 12/29/16 2117  AST 49* 41  ALT 25 22  ALKPHOS 137* 143*  BILITOT 0.8 1.5*  ALBUMIN 3.9 4.2    Cardiac Enzymes No results for input(s): TROPONINI, PROBNP in the last 168 hours.  Glucose No results for input(s): GLUCAP in the last 168  hours.  Imaging No results found.   STUDIES:  CT ABDOMEN/PELVIS 11/13: IMPRESSION: 1. Changes of hepatic cirrhosis again noted. Mild gastric and esophageal varices seen. Nonspecific focus of contrast blush again seen at the left hepatic lobe. 2. Small volume ascites noted tracking about the liver. 3. Prominent moderate anterior abdominal wall hernia, new from 2017, with associated diffuse soft tissue edema. No evidence of bowel herniation at this time. 4. Mild scarring at the left lingula. 5.  Aortic Atherosclerosis (ICD10-I70.0).  PORT CXR 11/22 >>>  MICROBIOLOGY: MRSA PCR 11/22 >>> Blood Cultures x2 11/22 >>>  ANTIBIOTICS: Vancomycin 11/22 >>> Zosyn 11/22 >>>  SIGNIFICANT EVENTS: 11/21 - Presented to [facility] 11/22 - Ex Lap for incarcerated omental hernia w/ wound vac placement >> returned to ICU on vent  LINES/TUBES: OETT 11/22 >>> Foley >>> PIV  DISCUSSION:  56 y.o. female with long-standing history of alcohol use. Presents with incarcerated omental hernia. Early signs of sepsis secondary to incarcerated hernia. Continuing broad-spectrum antibiotics. Monitoring closely on ventilator overnight  ASSESSMENT / PLAN:  PULMONARY A: Acute Hypoxic Respiratory Failure Tobacco Use Disorder:  Currently smoking 1ppd.   P:   Checking ABG & Fortaz x-ray upon arrival to ICU Continuing full ventilator support Daily pressures were weaning & spontaneous breathing trial Holding Advair Duonebs prn q6hr Nicotine Patch 14mg  daily  CARDIOVASCULAR A:  No acute issues.  P:  Continuous monitoring on telemetry Vital signs per unit protocol Maintaining MAP >65  RENAL A:   Hypokalemia: Status post replacement.  P:   Monitoring UOP with Foley  Repeat electrolytes in a.m. Along with renal function Checking stat BMP  GASTROINTESTINAL A:   Esophageal & Gastric Varices:  Noted on abdominal CT scan 11/13. Liver Cirrhosis w/ Ascites:  H/O paracentesis. Secondary to EtOH.  P:    NPO. Holding Aldactone & Lasix  HEMATOLOGIC A:   No acute issues.  P:  Trending cell counts daily with CBC.  INFECTIOUS A:   Sepsis:  Secondary to incarcerated omental hernia.  P:   Continuing in treatment with empiric vancomycin. Adding Zosyn to regimen MRSA PCR & blood cultures pending  ENDOCRINE A:   Hyperglycemia:  Mild. No h/o DM.   P:   Checking hemoglobin A1c Accu-Cheks every 4  hours Slight scale insulin per sensitive algorithm  NEUROLOGIC A:   Sedation on ventilator  History of bipolar disorder & anxiety History of multiple sclerosis: Followed at [facility].  Possible H/O Hepatic Encephalopathy  P:   RASS goal: 0 to -1 Fentanyl gtt & IV bolus as needed for pain control Versed IV prn sedation Holding Risperdal, Prozac, BuSpar, & Neurontin  Holding Xifaxan Thiamine & folic acid IV daily  Prophylaxis: Continuing Lovenox subcutaneous daily & Protonix IV every 12 hours. Ordering SCDs. Diet: Nothing by mouth. Holding on tube feedings. Code Status: Full code as per previous physician discussions. Disposition: Admitting to the ICU.  FAMILY  - Updates: No family at bedside at the time of my exam.   - Inter-disciplinary family meet or Palliative Care meeting due by:  11/28  I have spent a total of 33 minutes of critical care time today caring for the patient and reviewing the patient's electronic medical record.   Donna Christen Jamison Neighbor, M.D. [facility] Pager:  [phone] After 7pm or if no response, call [phone] 12/30/2016, 12:00 PM

## 2016-12-30 NOTE — ED Notes (Signed)
Report given to OR.

## 2016-12-30 NOTE — Anesthesia Postprocedure Evaluation (Signed)
Anesthesia Post Note  Patient: Anne Black  Procedure(s) Performed: EXPLORATORY LAPAROTOMY WITH REPAIR ABDOMINAL WALL VENTRAL HERNIA (N/A Abdomen) APPLICATION OF WOUND VAC (N/A Abdomen)     Patient location during evaluation: SICU Anesthesia Type: General Level of consciousness: sedated Pain management: pain level controlled Vital Signs Assessment: post-procedure vital signs reviewed and stable Respiratory status: patient remains intubated per anesthesia plan Cardiovascular status: stable Postop Assessment: no apparent nausea or vomiting Anesthetic complications: no    Last Vitals:  Vitals:   12/30/16 1226 12/30/16 1300  BP:  106/65  Pulse:  79  Resp:  14  Temp: 36.5 C   SpO2:  96%    Last Pain:  Vitals:   12/30/16 1226  TempSrc: Oral  PainSc:                  Cecile Hearing

## 2016-12-30 NOTE — Anesthesia Preprocedure Evaluation (Addendum)
Anesthesia Evaluation  Patient identified by MRN, date of birth, ID band Patient awake    Reviewed: Allergy & Precautions, NPO status , Patient's Chart, lab work & pertinent test results  Airway Mallampati: II  TM Distance: >3 FB Neck ROM: Full    Dental  (+) Teeth Intact, Dental Advisory Given   Pulmonary COPD,  COPD inhaler, Current Smoker,    Pulmonary exam normal breath sounds clear to auscultation       Cardiovascular negative cardio ROS Normal cardiovascular exam Rhythm:Regular Rate:Normal     Neuro/Psych  Headaches, PSYCHIATRIC DISORDERS Anxiety Bipolar Disorder MS  Neuromuscular disease    GI/Hepatic GERD  Medicated,(+) Cirrhosis     substance abuse  alcohol use, Ventral hernia    Endo/Other  negative endocrine ROS  Renal/GU negative Renal ROS     Musculoskeletal  (+) Arthritis ,   Abdominal   Peds  (+) ADHD Hematology negative hematology ROS (+) Hypokalemia   Anesthesia Other Findings Day of surgery medications reviewed with the patient.  Reproductive/Obstetrics                            Anesthesia Physical Anesthesia Plan  ASA: III and emergent  Anesthesia Plan: General   Post-op Pain Management:    Induction: Intravenous  PONV Risk Score and Plan: 4 or greater and Midazolam, Dexamethasone and Ondansetron  Airway Management Planned: Oral ETT  Additional Equipment:   Intra-op Plan:   Post-operative Plan: Possible Post-op intubation/ventilation  Informed Consent: I have reviewed the patients History and Physical, chart, labs and discussed the procedure including the risks, benefits and alternatives for the proposed anesthesia with the patient or authorized representative who has indicated his/her understanding and acceptance.   Dental advisory given  Plan Discussed with: CRNA  Anesthesia Plan Comments: (Risks/benefits of general anesthesia discussed with  patient including risk of damage to teeth, lips, gum, and tongue, nausea/vomiting, allergic reactions to medications, and the possibility of heart attack, stroke and death.  All patient questions answered.  Patient wishes to proceed.)      Anesthesia Quick Evaluation

## 2016-12-30 NOTE — ED Notes (Signed)
Pt to door, pulling all monitor cords, with blood dripping down her left arm. Pt has scabs all over arm and scratched them until they bled. Pt does not want to go back to bed. Asking this RN to stay in the room with her.

## 2016-12-30 NOTE — Anesthesia Procedure Notes (Signed)
Procedure Name: Intubation Date/Time: 12/30/2016 10:43 AM Performed by: Candis Shine, CRNA Pre-anesthesia Checklist: Patient identified, Emergency Drugs available, Suction available and Patient being monitored Patient Re-evaluated:Patient Re-evaluated prior to induction Oxygen Delivery Method: Circle System Utilized Preoxygenation: Pre-oxygenation with 100% oxygen Induction Type: IV induction Ventilation: Mask ventilation without difficulty Laryngoscope Size: Mac and 3 Grade View: Grade II Tube type: Oral Tube size: 7.0 mm Number of attempts: 1 Airway Equipment and Method: Stylet Placement Confirmation: ETT inserted through vocal cords under direct vision,  positive ETCO2 and breath sounds checked- equal and bilateral Secured at: 22 cm Tube secured with: Tape Dental Injury: Teeth and Oropharynx as per pre-operative assessment

## 2016-12-30 NOTE — Progress Notes (Signed)
Patient ID: Anne Black, female   DOB: 05/08/1960, 56 y.o.   MRN: [id number] Her daughters arrived. Patient asked me not to call anyone preop.  I did explain to her daughters that situation and plan moving forward.

## 2016-12-30 NOTE — H&P (Addendum)
CC: Abdominal wall wound; consult in ER by Dr. Fuller Plan  HPI: Anne Black is an 57 y.o. female with hx of alcohol abuse, MS, ADHD, anxiety, bipolar disorder presents to the ER today complaining of a 4day history of an abdominal wall wound. Was seen in the ER at [facility] 1 week ago with a fat containing ventral hernia but had no frank skin changes per the patient but did have a scab. This subsequently came off 4 days ago and a piece of yellow fat protruded from the skin and has been present since. Clear fluid was draining but this subsequently stopped 24hrs ago. Currently denies f/c/n/v; having flatus. During my evaluation of her, she had just completed eating a [place] sandwich, graham crackers +peanut butter and a coke.  Hx of cirrhosis - last LVP was 01/31/16.  Currently Child's A6/B7  PSH Csx via Pfannenstiel x2; denies prior incisions in the midline/umbilicus.  Past Medical History:  Diagnosis Date  . ADHD (attention deficit hyperactivity disorder)   . Anxiety   . Arthritis   . Ascites   . Bipolar disorder ([place])   . Cirrhosis ([place])   . Hypokalemia   . Migraine   . Multiple sclerosis ([place])   . Osteoporosis     Past Surgical History:  Procedure Laterality Date  . CESAREAN SECTION  [id number]  . MYRINGOTOMY WITH TUBE PLACEMENT Bilateral   . ORIF WRIST FRACTURE Left 09/09/2016   Procedure: OPEN REDUCTION INTERNAL FIXATION (ORIF) LEFT WRIST FRACTURE, LEFT CARPAL TUNNEL RELEASE;  Surgeon: Roseanne Kaufman, MD;  Location: [facility];  Service: Orthopedics;  Laterality: Left;  . TONSILLECTOMY      Family History  Problem Relation Age of Onset  . Arrhythmia Mother   . Heart disease Father   . Hypertension Father     Social: She reports that she has been smoking cigarettes.  She has a 20.00 pack-year smoking history - ~1/2 pack per day. Drinks at least 3-4 glasses of wine per day. She reports that she does not use drugs.  Allergies: No Known Allergies  Medications: I have reviewed  the patient's current medications.  Results for orders placed or performed during the hospital encounter of 12/29/16 (from the past 48 hour(s))  Comprehensive metabolic panel     Status: Abnormal   Collection Time: 12/29/16  9:17 PM  Result Value Ref Range   Sodium 133 (L) 135 - 145 mmol/L   Potassium 2.6 (LL) 3.5 - 5.1 mmol/L    Comment: CRITICAL RESULT CALLED TO, READ BACK BY AND VERIFIED WITH: CHRISCO C,RN 12/29/16 2225 WAYK    Chloride 87 (L) 101 - 111 mmol/L   CO2 30 22 - 32 mmol/L   Glucose, Bld 105 (H) 65 - 99 mg/dL   BUN <5 (L) 6 - 20 mg/dL   Creatinine, Ser 0.65 0.44 - 1.00 mg/dL   Calcium 8.6 (L) 8.9 - 10.3 mg/dL   Total Protein 8.3 (H) 6.5 - 8.1 g/dL   Albumin 4.2 3.5 - 5.0 g/dL   AST 41 15 - 41 U/L   ALT 22 14 - 54 U/L   Alkaline Phosphatase 143 (H) 38 - 126 U/L   Total Bilirubin 1.5 (H) 0.3 - 1.2 mg/dL   GFR calc non Af Amer >60 >60 mL/min   GFR calc Af Amer >60 >60 mL/min    Comment: (NOTE) The eGFR has been calculated using the CKD EPI equation. This calculation has not been validated in all clinical situations. eGFR's persistently <60 mL/min signify  possible Chronic Kidney Disease.    Anion gap 16 (H) 5 - 15  Ethanol     Status: None   Collection Time: 12/29/16  9:17 PM  Result Value Ref Range   Alcohol, Ethyl (B) <10 <10 mg/dL    Comment:        LOWEST DETECTABLE LIMIT FOR SERUM ALCOHOL IS 10 mg/dL FOR MEDICAL PURPOSES ONLY   Salicylate level     Status: None   Collection Time: 12/29/16  9:17 PM  Result Value Ref Range   Salicylate Lvl <6.4 2.8 - 30.0 mg/dL  Acetaminophen level     Status: Abnormal   Collection Time: 12/29/16  9:17 PM  Result Value Ref Range   Acetaminophen (Tylenol), Serum <10 (L) 10 - 30 ug/mL    Comment:        THERAPEUTIC CONCENTRATIONS VARY SIGNIFICANTLY. A RANGE OF 10-30 ug/mL MAY BE AN EFFECTIVE CONCENTRATION FOR MANY PATIENTS. HOWEVER, SOME ARE BEST TREATED AT CONCENTRATIONS OUTSIDE THIS RANGE. ACETAMINOPHEN  CONCENTRATIONS >150 ug/mL AT 4 HOURS AFTER INGESTION AND >50 ug/mL AT 12 HOURS AFTER INGESTION ARE OFTEN ASSOCIATED WITH TOXIC REACTIONS.   cbc     Status: Abnormal   Collection Time: 12/29/16  9:17 PM  Result Value Ref Range   WBC 11.0 (H) 4.0 - 10.5 K/uL   RBC 4.41 3.87 - 5.11 MIL/uL   Hemoglobin 12.8 12.0 - 15.0 g/dL   HCT 40.0 36.0 - 46.0 %   MCV 90.7 78.0 - 100.0 fL   MCH 29.0 26.0 - 34.0 pg   MCHC 32.0 30.0 - 36.0 g/dL   RDW 15.0 11.5 - 15.5 %   Platelets 182 150 - 400 K/uL  Lipase, blood     Status: None   Collection Time: 12/29/16  9:17 PM  Result Value Ref Range   Lipase 35 11 - 51 U/L  Magnesium     Status: Abnormal   Collection Time: 12/29/16  9:17 PM  Result Value Ref Range   Magnesium 1.4 (L) 1.7 - 2.4 mg/dL  I-Stat beta hCG blood, ED     Status: None   Collection Time: 12/29/16  9:27 PM  Result Value Ref Range   I-stat hCG, quantitative <5.0 <5 mIU/mL   Comment 3            Comment:   GEST. AGE      CONC.  (mIU/mL)   <=1 WEEK        5 - 50     2 WEEKS       50 - 500     3 WEEKS       100 - 10,000     4 WEEKS     1,000 - 30,000        FEMALE AND NON-PREGNANT FEMALE:     LESS THAN 5 mIU/mL   Rapid urine drug screen (hospital performed)     Status: Abnormal   Collection Time: 12/29/16  9:30 PM  Result Value Ref Range   Opiates NONE DETECTED NONE DETECTED   Cocaine NONE DETECTED NONE DETECTED   Benzodiazepines POSITIVE (A) NONE DETECTED   Amphetamines NONE DETECTED NONE DETECTED   Tetrahydrocannabinol NONE DETECTED NONE DETECTED   Barbiturates NONE DETECTED NONE DETECTED    Comment:        DRUG SCREEN FOR MEDICAL PURPOSES ONLY.  IF CONFIRMATION IS NEEDED FOR ANY PURPOSE, NOTIFY LAB WITHIN 5 DAYS.        LOWEST DETECTABLE LIMITS FOR URINE DRUG SCREEN Drug  Class       Cutoff (ng/mL) Amphetamine      1000 Barbiturate      200 Benzodiazepine   355 Tricyclics       974 Opiates          300 Cocaine          300 THC              50   Urinalysis,  Routine w reflex microscopic     Status: Abnormal   Collection Time: 12/29/16  9:30 PM  Result Value Ref Range   Color, Urine STRAW (A) YELLOW   APPearance CLEAR CLEAR   Specific Gravity, Urine 1.005 1.005 - 1.030   pH 7.0 5.0 - 8.0   Glucose, UA NEGATIVE NEGATIVE mg/dL   Hgb urine dipstick NEGATIVE NEGATIVE   Bilirubin Urine NEGATIVE NEGATIVE   Ketones, ur NEGATIVE NEGATIVE mg/dL   Protein, ur NEGATIVE NEGATIVE mg/dL   Nitrite NEGATIVE NEGATIVE   Leukocytes, UA NEGATIVE NEGATIVE  CBC     Status: None   Collection Time: 12/30/16  1:26 AM  Result Value Ref Range   WBC 9.0 4.0 - 10.5 K/uL   RBC 4.17 3.87 - 5.11 MIL/uL   Hemoglobin 12.0 12.0 - 15.0 g/dL   HCT 37.6 36.0 - 46.0 %   MCV 90.2 78.0 - 100.0 fL   MCH 28.8 26.0 - 34.0 pg   MCHC 31.9 30.0 - 36.0 g/dL   RDW 14.8 11.5 - 15.5 %   Platelets 154 150 - 400 K/uL    No results found.  ROS - all of the below systems have been reviewed with the patient and positives are indicated with bold text General: chills, fever or night sweats Eyes: blurry vision or double vision ENT: epistaxis or sore throat Allergy/Immunology: itchy/watery eyes or nasal congestion Hematologic/Lymphatic: bleeding problems, blood clots or swollen lymph nodes Endocrine: temperature intolerance or unexpected weight changes Breast: new or changing breast lumps or nipple discharge Resp: cough, shortness of breath, or wheezing CV: chest pain or dyspnea on exertion GI: as per HPI GU: dysuria, trouble voiding, or hematuria MSK: joint pain or joint stiffness Neuro: TIA or stroke symptoms Derm: pruritus and skin lesion changes; abdominal wall redness Psych: anxiety and depression  PE Blood pressure 114/75, pulse 94, temperature 98.8 F (37.1 C), resp. rate (!) 25, SpO2 90 %. Constitutional: NAD; conversant; no deformities Eyes: Moist conjunctiva; no lid lag; anicteric Neck: Trachea midline; no thyromegaly Lungs: Normal respiratory effort; no tactile  fremitus CV: RRR; no palpable thrills; no pitting edema GI: Abdomen soft, NT/ND; open umbilical/ventral hernia wound with a desiccated piece of omentum. Associated blanching erythema around the hernia. MSK: Normal gait; no clubbing/cyanosis Psychiatric: Anxious affect; alert and oriented x3 Lymphatic: No palpable cervical or axillary lymphadenopathy  Results for orders placed or performed during the hospital encounter of 12/29/16 (from the past 48 hour(s))  Comprehensive metabolic panel     Status: Abnormal   Collection Time: 12/29/16  9:17 PM  Result Value Ref Range   Sodium 133 (L) 135 - 145 mmol/L   Potassium 2.6 (LL) 3.5 - 5.1 mmol/L    Comment: CRITICAL RESULT CALLED TO, READ BACK BY AND VERIFIED WITH: CHRISCO C,RN 12/29/16 2225 WAYK    Chloride 87 (L) 101 - 111 mmol/L   CO2 30 22 - 32 mmol/L   Glucose, Bld 105 (H) 65 - 99 mg/dL   BUN <5 (L) 6 - 20 mg/dL   Creatinine, Ser 0.65 0.44 -  1.00 mg/dL   Calcium 8.6 (L) 8.9 - 10.3 mg/dL   Total Protein 8.3 (H) 6.5 - 8.1 g/dL   Albumin 4.2 3.5 - 5.0 g/dL   AST 41 15 - 41 U/L   ALT 22 14 - 54 U/L   Alkaline Phosphatase 143 (H) 38 - 126 U/L   Total Bilirubin 1.5 (H) 0.3 - 1.2 mg/dL   GFR calc non Af Amer >60 >60 mL/min   GFR calc Af Amer >60 >60 mL/min    Comment: (NOTE) The eGFR has been calculated using the CKD EPI equation. This calculation has not been validated in all clinical situations. eGFR's persistently <60 mL/min signify possible Chronic Kidney Disease.    Anion gap 16 (H) 5 - 15  Ethanol     Status: None   Collection Time: 12/29/16  9:17 PM  Result Value Ref Range   Alcohol, Ethyl (B) <10 <10 mg/dL    Comment:        LOWEST DETECTABLE LIMIT FOR SERUM ALCOHOL IS 10 mg/dL FOR MEDICAL PURPOSES ONLY   Salicylate level     Status: None   Collection Time: 12/29/16  9:17 PM  Result Value Ref Range   Salicylate Lvl <9.5 2.8 - 30.0 mg/dL  Acetaminophen level     Status: Abnormal   Collection Time: 12/29/16  9:17 PM    Result Value Ref Range   Acetaminophen (Tylenol), Serum <10 (L) 10 - 30 ug/mL    Comment:        THERAPEUTIC CONCENTRATIONS VARY SIGNIFICANTLY. A RANGE OF 10-30 ug/mL MAY BE AN EFFECTIVE CONCENTRATION FOR MANY PATIENTS. HOWEVER, SOME ARE BEST TREATED AT CONCENTRATIONS OUTSIDE THIS RANGE. ACETAMINOPHEN CONCENTRATIONS >150 ug/mL AT 4 HOURS AFTER INGESTION AND >50 ug/mL AT 12 HOURS AFTER INGESTION ARE OFTEN ASSOCIATED WITH TOXIC REACTIONS.   cbc     Status: Abnormal   Collection Time: 12/29/16  9:17 PM  Result Value Ref Range   WBC 11.0 (H) 4.0 - 10.5 K/uL   RBC 4.41 3.87 - 5.11 MIL/uL   Hemoglobin 12.8 12.0 - 15.0 g/dL   HCT 40.0 36.0 - 46.0 %   MCV 90.7 78.0 - 100.0 fL   MCH 29.0 26.0 - 34.0 pg   MCHC 32.0 30.0 - 36.0 g/dL   RDW 15.0 11.5 - 15.5 %   Platelets 182 150 - 400 K/uL  Lipase, blood     Status: None   Collection Time: 12/29/16  9:17 PM  Result Value Ref Range   Lipase 35 11 - 51 U/L  Magnesium     Status: Abnormal   Collection Time: 12/29/16  9:17 PM  Result Value Ref Range   Magnesium 1.4 (L) 1.7 - 2.4 mg/dL  I-Stat beta hCG blood, ED     Status: None   Collection Time: 12/29/16  9:27 PM  Result Value Ref Range   I-stat hCG, quantitative <5.0 <5 mIU/mL   Comment 3            Comment:   GEST. AGE      CONC.  (mIU/mL)   <=1 WEEK        5 - 50     2 WEEKS       50 - 500     3 WEEKS       100 - 10,000     4 WEEKS     1,000 - 30,000        FEMALE AND NON-PREGNANT FEMALE:     LESS THAN 5  mIU/mL   Rapid urine drug screen (hospital performed)     Status: Abnormal   Collection Time: 12/29/16  9:30 PM  Result Value Ref Range   Opiates NONE DETECTED NONE DETECTED   Cocaine NONE DETECTED NONE DETECTED   Benzodiazepines POSITIVE (A) NONE DETECTED   Amphetamines NONE DETECTED NONE DETECTED   Tetrahydrocannabinol NONE DETECTED NONE DETECTED   Barbiturates NONE DETECTED NONE DETECTED    Comment:        DRUG SCREEN FOR MEDICAL PURPOSES ONLY.  IF CONFIRMATION IS  NEEDED FOR ANY PURPOSE, NOTIFY LAB WITHIN 5 DAYS.        LOWEST DETECTABLE LIMITS FOR URINE DRUG SCREEN Drug Class       Cutoff (ng/mL) Amphetamine      1000 Barbiturate      200 Benzodiazepine   786 Tricyclics       767 Opiates          300 Cocaine          300 THC              50   Urinalysis, Routine w reflex microscopic     Status: Abnormal   Collection Time: 12/29/16  9:30 PM  Result Value Ref Range   Color, Urine STRAW (A) YELLOW   APPearance CLEAR CLEAR   Specific Gravity, Urine 1.005 1.005 - 1.030   pH 7.0 5.0 - 8.0   Glucose, UA NEGATIVE NEGATIVE mg/dL   Hgb urine dipstick NEGATIVE NEGATIVE   Bilirubin Urine NEGATIVE NEGATIVE   Ketones, ur NEGATIVE NEGATIVE mg/dL   Protein, ur NEGATIVE NEGATIVE mg/dL   Nitrite NEGATIVE NEGATIVE   Leukocytes, UA NEGATIVE NEGATIVE  CBC     Status: None   Collection Time: 12/30/16  1:26 AM  Result Value Ref Range   WBC 9.0 4.0 - 10.5 K/uL   RBC 4.17 3.87 - 5.11 MIL/uL   Hemoglobin 12.0 12.0 - 15.0 g/dL   HCT 37.6 36.0 - 46.0 %   MCV 90.2 78.0 - 100.0 fL   MCH 28.8 26.0 - 34.0 pg   MCHC 31.9 30.0 - 36.0 g/dL   RDW 14.8 11.5 - 15.5 %   Platelets 154 150 - 400 K/uL   Imaging: CT A/P 12/21/16: IMPRESSION: 1. Changes of hepatic cirrhosis again noted. Mild gastric and esophageal varices seen. Nonspecific focus of contrast blush again seen at the left hepatic lobe. 2. Small volume ascites noted tracking about the liver. 3. Prominent moderate anterior abdominal wall hernia, new from 2017, with associated diffuse soft tissue edema. No evidence of bowel herniation at this time. 4. Mild scarring at the left lingula. 5. Aortic Atherosclerosis (ICD10-I70.0).   A/P: Anne Black is an 56 y.o. female with history of alcoholism+ cirrhosis and primary ventral hernia with extruding piece of omentum x 4 days  -NPO -Replace potassium, magnesium -PT/INR (ordered) -IVF -IV Vanc+Zosyn (ordered) for abdominal wall cellulitis present on  admission. -Hospitalist service has admitted her; high likelihood for EtOH withdrawal - started on CIWA protocol, ativan -Needs primary ventral hernia repair with excision of some of the overlying skin; advised that normally we'd do this now but since she finished eating her dinner at 2am, we will need to wait a minimum of 6 hours  -I discussed at length the anatomy, physiology and pathophysiology of abdominal wall hernias and their management; the high risk for recurrence in her case given her active smoking history, cirrhosis and inability to undergo a mesh repair 2/2 an open wound  and cellulitis -The planned procedure, material risks (including, but not limited to, pain, bleeding, infection, scarring, wound infection, recurrence of the hernia, leakage of ascites, nerve damage, need for additional procedures, damage to surrounding structures, heart attack, stroke, death) benefits and alternatives were described. Her questions were answered to her satisfaction and she elected to proceed  Sharon Mt. Dema Severin, M.D. [facility]

## 2016-12-30 NOTE — Transfer of Care (Signed)
Immediate Anesthesia Transfer of Care Note  Patient: Anne Black  Procedure(s) Performed: EXPLORATORY LAPAROTOMY WITH REPAIR ABDOMINAL WALL VENTRAL HERNIA (N/A Abdomen) APPLICATION OF WOUND VAC (N/A Abdomen)  Patient Location: ICU  Anesthesia Type:General  Level of Consciousness: sedated and Patient remains intubated per anesthesia plan  Airway & Oxygen Therapy: Patient remains intubated per anesthesia plan and Patient placed on Ventilator (see vital sign flow sheet for setting)  Post-op Assessment: Report given to RN and Post -op Vital signs reviewed and stable  Post vital signs: Reviewed and stable  Last Vitals:  Vitals:   12/30/16 0945 12/30/16 1226  BP: (!) 106/58   Pulse: 77   Resp:    Temp:  36.5 C  SpO2: 97%     Last Pain:  Vitals:   12/30/16 1226  TempSrc: Oral  PainSc:          Complications: No apparent anesthesia complications

## 2016-12-30 NOTE — Interval H&P Note (Signed)
History and Physical Interval Note:  12/30/2016 7:57 AM I have seen patient, reviewed labs and prior ct scan. She has abdominal wall cellulitis, incarcerated uh that has eroded through skin in setting of childs a cirrhosis.  MELD score is 11.  She needs surgery today.  Discussed elap with primary repair and open wound. She does not want me to contact any family or friends about this.  I discussed risks including liver failure leading to death.   Anne Black  has presented today for surgery, with the diagnosis of ventral hernia  The various methods of treatment have been discussed with the patient and family. After consideration of risks, benefits and other options for treatment, the patient has consented to  Procedure(s): REPAIR ABDOMINAL WALL ventral hernia (N/A) as a surgical intervention .  The patient's history has been reviewed, patient examined, no change in status, stable for surgery.  I have reviewed the patient's chart and labs.  Questions were answered to the patient's satisfaction.     [name]

## 2016-12-30 NOTE — ED Notes (Signed)
Patient denies pain and is resting comfortably.  

## 2016-12-30 NOTE — Progress Notes (Signed)
[facility] PROGRESS NOTE  GERYL SCHUMANN XIH:[id number] DOB: 04-26-1960 DOA: 12/29/2016 PCP: Patient, No Pcp Per  Interim summary and HPI 56 y/o with pmh significant for cirrhosis, hypokalemia, bipolar disorder, alcohol abuse and MS; who presented with complaints of having cramps all over her body for the last 2 days. Patient found with incarcerated ventral hernia and superimposed cellulitis.  Assessment/Plan: 1-incarcerated hernia with superimposed cellulitis -patient seen by general surgery and with plans to go to OR for surgical repair later today. -will continue vancomycin -keep patient NPO -patient in no acute distress currently and reporting just mild pain. Positive yellowish discharge appreciated.  2-leukocytosis -due to #1 -continue abx's -follow trend  3-hypokalemia -will replete and check Mg level -follow trend and further replete as needed   4-alcohol abuse -will monitor on CIWA protocol -thiamine and folic acid -cessation encouraged  5-cirrhosis with varices secondary to alcohol -MELD score 9 -alcohol cessation provided -will benefit of outpatient GI follow up  6-suicidal thoughts/depression -denied on my evaluation -but reporting feeling hopeless -will asked psychiatry to see her. -sitter at bedside for now  7-hx of MS -continue outpatient follow up with neurology at [facility] -currently treated with Vyvanse    Code Status: Full Family Communication: no family at bedside Disposition Plan: remains inpatient, follow surgical outcome, continue IV antibiotics.   Consultants:  [facility]  Procedures:  Surgical intervention for ventral hernia repair  Antibiotics:  Vancomycin 11/21  HPI/Subjective: No fever, no CP, no SOB, no nausea, no vomiting. Flat affect appreciated. Denying SI.  Objective: Vitals:   12/30/16 0730 12/30/16 0945  BP: 108/64 (!) 106/58  Pulse: 73 77  Resp: 14   Temp:    SpO2: 97% 97%    Intake/Output Summary (Last 24  hours) at 12/30/2016 1201 Last data filed at 12/30/2016 1115 Gross per 24 hour  Intake 1250 ml  Output 175 ml  Net 1075 ml   There were no vitals filed for this visit.  Exam:   General:  Afebrile, no CP, no nausea, no vomiting. Reporting abd cramps.  Cardiovascular: S1 and S2, no rubs, no gallops.  Respiratory: mild tachypnea, no wheezing, no crackles, no using accessory muscles.  Abdomen: soft, tender on palpitations, redness and positive ulcerated ventral hernia with surrounding cellulitis.   Musculoskeletal: no cyanosis, no clubbing   Data Reviewed: Basic Metabolic Panel: Recent Labs  Lab 12/25/16 1828 12/29/16 2117 12/30/16 0909  NA 137 133* 134*  K 3.9 2.6* 2.8*  CL 106 87* 95*  CO2 22 30 30   GLUCOSE 97 105* 120*  BUN 7 <5* <5*  CREATININE 0.44 0.65 0.46  CALCIUM 8.6* 8.6* 8.0*  MG  --  1.4* 2.1   Liver Function Tests: Recent Labs  Lab 12/25/16 1828 12/29/16 2117  AST 49* 41  ALT 25 22  ALKPHOS 137* 143*  BILITOT 0.8 1.5*  PROT 7.6 8.3*  ALBUMIN 3.9 4.2   Recent Labs  Lab 12/25/16 1828 12/29/16 2117  LIPASE 31 35   No results for input(s): AMMONIA in the last 168 hours. CBC: Recent Labs  Lab 12/25/16 1828 12/29/16 2117 12/30/16 0126  WBC 9.4 11.0* 9.0  HGB 11.7* 12.8 12.0  HCT 38.0 40.0 37.6  MCV 92.2 90.7 90.2  PLT 185 182 154   BNP (last 3 results) Recent Labs    12/09/16 0041  BNP 63.7    Studies: No results found.  Scheduled Meds: . [MAR Hold] enoxaparin (LOVENOX) injection  40 mg Subcutaneous Q24H  . [MAR Hold] folic  acid  1 mg Oral Daily  . [MAR Hold] LORazepam  0-4 mg Intravenous Q6H   Followed by  . [MAR Hold] LORazepam  0-4 mg Intravenous Q12H  . [MAR Hold] multivitamin with minerals  1 tablet Oral Daily  . [MAR Hold] pantoprazole (PROTONIX) IV  40 mg Intravenous Q12H  . [MAR Hold] thiamine  100 mg Oral Daily   Or  . [MAR Hold] thiamine  100 mg Intravenous Daily   Continuous Infusions: . [MAR Hold] vancomycin       Time spent: 35 minutes    Vassie Lollarlos Ninel Abdella  [facility] Pager [phone]. If 7PM-7AM, please contact night-coverage at [email or web address], password [email or web address] 12/30/2016, 12:01 PM  LOS: 0 days

## 2016-12-30 NOTE — ED Notes (Signed)
PT/INR drawn and sent to the lab. Pt sleeping soundly. No complaints. VSS. Will continue to monitor.

## 2016-12-30 NOTE — ED Notes (Signed)
Dr Dwain Sarna at bedside to eval pt. No change in condition.

## 2016-12-31 ENCOUNTER — Encounter (HOSPITAL_COMMUNITY): Payer: Self-pay | Admitting: General Surgery

## 2016-12-31 ENCOUNTER — Inpatient Hospital Stay (HOSPITAL_COMMUNITY): Payer: 59

## 2016-12-31 DIAGNOSIS — L03311 Cellulitis of abdominal wall: Secondary | ICD-10-CM

## 2016-12-31 DIAGNOSIS — J9601 Acute respiratory failure with hypoxia: Secondary | ICD-10-CM

## 2016-12-31 LAB — PROCALCITONIN: Procalcitonin: <0.1 ng/mL

## 2016-12-31 LAB — BLOOD GAS, ARTERIAL
Acid-Base Excess: 4 mmol/L — ABNORMAL HIGH (ref 0.0–2.0)
Bicarbonate: 28.5 mmol/L — ABNORMAL HIGH (ref 20.0–28.0)
Drawn by: 418751
FIO2: 40
MECHVT: 420 mL
O2 Saturation: 98.9 %
PEEP: 5 cmH2O
Patient temperature: 98.6
RATE: 22 {breaths}/min
pCO2 arterial: 47.6 mmHg (ref 32.0–48.0)
pH, Arterial: 7.395 (ref 7.350–7.450)
pO2, Arterial: 121 mmHg — ABNORMAL HIGH (ref 83.0–108.0)

## 2016-12-31 LAB — COMPREHENSIVE METABOLIC PANEL WITH GFR
ALT: 13 U/L — ABNORMAL LOW (ref 14–54)
AST: 24 U/L (ref 15–41)
Albumin: 3 g/dL — ABNORMAL LOW (ref 3.5–5.0)
Alkaline Phosphatase: 95 U/L (ref 38–126)
Anion gap: 5 (ref 5–15)
BUN: <5 mg/dL — ABNORMAL LOW (ref 6–20)
CO2: 30 mmol/L (ref 22–32)
Calcium: 8.2 mg/dL — ABNORMAL LOW (ref 8.9–10.3)
Chloride: 101 mmol/L (ref 101–111)
Creatinine, Ser: 0.53 mg/dL (ref 0.44–1.00)
GFR calc Af Amer: >60 mL/min
GFR calc non Af Amer: >60 mL/min
Glucose, Bld: 105 mg/dL — ABNORMAL HIGH (ref 65–99)
Potassium: 3.9 mmol/L (ref 3.5–5.1)
Sodium: 136 mmol/L (ref 135–145)
Total Bilirubin: 1.3 mg/dL — ABNORMAL HIGH (ref 0.3–1.2)
Total Protein: 5.8 g/dL — ABNORMAL LOW (ref 6.5–8.1)

## 2016-12-31 LAB — POCT I-STAT 3, ART BLOOD GAS (G3+)
ACID-BASE EXCESS: 2 mmol/L (ref 0.0–2.0)
Bicarbonate: 30.7 mmol/L — ABNORMAL HIGH (ref 20.0–28.0)
O2 SAT: 99 %
PCO2 ART: 68.3 mmHg — AB (ref 32.0–48.0)
PH ART: 7.261 — AB (ref 7.350–7.450)
Patient temperature: 98.6
TCO2: 33 mmol/L — AB (ref 22–32)
pO2, Arterial: 167 mmHg — ABNORMAL HIGH (ref 83.0–108.0)

## 2016-12-31 LAB — CBC WITH DIFFERENTIAL/PLATELET
BASOS ABS: 0 10*3/uL (ref 0.0–0.1)
Basophils Relative: 0 %
EOS PCT: 0 %
Eosinophils Absolute: 0 10*3/uL (ref 0.0–0.7)
HEMATOCRIT: 32.1 % — AB (ref 36.0–46.0)
Hemoglobin: 9.8 g/dL — ABNORMAL LOW (ref 12.0–15.0)
LYMPHS PCT: 9 %
Lymphs Abs: 0.9 10*3/uL (ref 0.7–4.0)
MCH: 28.3 pg (ref 26.0–34.0)
MCHC: 30.5 g/dL (ref 30.0–36.0)
MCV: 92.8 fL (ref 78.0–100.0)
Monocytes Absolute: 1.1 10*3/uL — ABNORMAL HIGH (ref 0.1–1.0)
Monocytes Relative: 11 %
NEUTROS ABS: 7.6 10*3/uL (ref 1.7–7.7)
Neutrophils Relative %: 80 %
PLATELETS: 128 10*3/uL — AB (ref 150–400)
RBC: 3.46 MIL/uL — AB (ref 3.87–5.11)
RDW: 14.7 % (ref 11.5–15.5)
WBC: 9.6 10*3/uL (ref 4.0–10.5)

## 2016-12-31 LAB — PROTIME-INR
INR: 1.2
Prothrombin Time: 15.1 s (ref 11.4–15.2)

## 2016-12-31 LAB — GLUCOSE, CAPILLARY
GLUCOSE-CAPILLARY: 94 mg/dL (ref 65–99)
GLUCOSE-CAPILLARY: 104 mg/dL — AB (ref 65–99)
Glucose-Capillary: 97 mg/dL (ref 65–99)
Glucose-Capillary: 109 mg/dL — ABNORMAL HIGH (ref 65–99)

## 2016-12-31 LAB — MAGNESIUM: MAGNESIUM: 1.8 mg/dL (ref 1.7–2.4)

## 2016-12-31 LAB — PHOSPHORUS: Phosphorus: 2.7 mg/dL (ref 2.5–4.6)

## 2016-12-31 MED ORDER — ORAL CARE MOUTH RINSE
15.0000 mL | Freq: Two times a day (BID) | OROMUCOSAL | Status: DC
  Administered 2016-12-31 – 2017-01-03 (×6): 15 mL via OROMUCOSAL

## 2016-12-31 MED ORDER — NALOXONE HCL 0.4 MG/ML IJ SOLN
0.4000 mg | Freq: Once | INTRAMUSCULAR | Status: AC
  Administered 2016-12-31: 0.4 mg via INTRAVENOUS
  Filled 2016-12-31: qty 1

## 2016-12-31 MED ORDER — FUROSEMIDE 10 MG/ML IJ SOLN
40.0000 mg | Freq: Once | INTRAMUSCULAR | Status: AC
  Administered 2016-12-31: 40 mg via INTRAVENOUS
  Filled 2016-12-31: qty 4

## 2016-12-31 MED ORDER — ENOXAPARIN SODIUM 40 MG/0.4ML ~~LOC~~ SOLN
40.0000 mg | SUBCUTANEOUS | Status: DC
  Administered 2016-12-31 – 2017-01-08 (×9): 40 mg via SUBCUTANEOUS
  Filled 2016-12-31 (×10): qty 0.4

## 2016-12-31 NOTE — Progress Notes (Signed)
PCCM Attending:  Stat ABG noted with acute hypercarbia and subsequent acidosis. Backup rate set to 16 on BiPAP. Administering Narcan 0.4 mg IV push. Proceed with Lasix 40 mg IV. Continuing close monitoring given potential need for reintubation.  Donna Christen Jamison Neighbor, M.D. [facility] Pager:  [phone] After 7pm or if no response, call [phone] 6:13 PM 12/31/16

## 2016-12-31 NOTE — Progress Notes (Signed)
1 Day Post-Op  Subjective: Remains on vent.  Trying to wean.  Agitated. BP 121/68.  Heart rate 94. Good urine output Negative pressure dressing in place  Labs:  Hemoglobin 9.8.  WBC 9600.  Potassium 3.9.  Glucose 101.  Creatinine 0.53  Objective: Vital signs in last 24 hours: Temp:  [97.6 F (36.4 C)-99.2 F (37.3 C)] 99.2 F (37.3 C) (11/23 0713) Pulse Rate:  [56-102] 94 (11/23 0900) Resp:  [8-28] 8 (11/23 0900) BP: (90-133)/(44-86) 121/68 (11/23 0900) SpO2:  [93 %-100 %] 94 % (11/23 0914) FiO2 (%):  [40 %] 40 % (11/23 0914) Weight:  [67.9 kg (149 lb 11.1 oz)-78 kg (171 lb 15.3 oz)] 78 kg (171 lb 15.3 oz) (11/23 0603)    Intake/Output from previous day: 11/22 0701 - 11/23 0700 In: 4217.7 [I.V.:3417.7; IV Piggyback:800] Out: 965 [Urine:890; Blood:75] Intake/Output this shift: Total I/O In: 408.8 [I.V.:408.8] Out: 50 [Urine:50]  General appearance: Intubated.  Agitated. Resp: Coarse breath sounds bilaterally GI: Soft.  Appropriate incisional tenderness.  Negative pressure dressing in place no significant guarding  Lab Results:  Results for orders placed or performed during the hospital encounter of 12/29/16 (from the past 24 hour(s))  Glucose, capillary     Status: Abnormal   Collection Time: 12/30/16 12:25 PM  Result Value Ref Range   Glucose-Capillary 138 (H) 65 - 99 mg/dL   Comment 1 Capillary Specimen    Comment 2 Notify RN   MRSA PCR Screening     Status: None   Collection Time: 12/30/16 12:27 PM  Result Value Ref Range   MRSA by PCR NEGATIVE NEGATIVE  Hemoglobin A1c     Status: None   Collection Time: 12/30/16 12:40 PM  Result Value Ref Range   Hgb A1c MFr Bld 5.0 4.8 - 5.6 %   Mean Plasma Glucose 96.8 mg/dL  Procalcitonin - Baseline     Status: None   Collection Time: 12/30/16 12:40 PM  Result Value Ref Range   Procalcitonin <0.10 ng/mL  Basic metabolic panel     Status: Abnormal   Collection Time: 12/30/16 12:40 PM  Result Value Ref Range   Sodium  133 (L) 135 - 145 mmol/L   Potassium 3.0 (L) 3.5 - 5.1 mmol/L   Chloride 95 (L) 101 - 111 mmol/L   CO2 30 22 - 32 mmol/L   Glucose, Bld 146 (H) 65 - 99 mg/dL   BUN <5 (L) 6 - 20 mg/dL   Creatinine, Ser 0.50 0.44 - 1.00 mg/dL   Calcium 8.0 (L) 8.9 - 10.3 mg/dL   GFR calc non Af Amer >60 >60 mL/min   GFR calc Af Amer >60 >60 mL/min   Anion gap 8 5 - 15  Triglycerides     Status: None   Collection Time: 12/30/16 12:40 PM  Result Value Ref Range   Triglycerides 76 <150 mg/dL  I-STAT 3, arterial blood gas (G3+)     Status: Abnormal   Collection Time: 12/30/16  2:43 PM  Result Value Ref Range   pH, Arterial 7.275 (L) 7.350 - 7.450   pCO2 arterial 75.2 (HH) 32.0 - 48.0 mmHg   pO2, Arterial 145.0 (H) 83.0 - 108.0 mmHg   Bicarbonate 34.9 (H) 20.0 - 28.0 mmol/L   TCO2 37 (H) 22 - 32 mmol/L   O2 Saturation 99.0 %   Acid-Base Excess 6.0 (H) 0.0 - 2.0 mmol/L   Patient temperature 98.6 F    Collection site RADIAL, ALLEN'S TEST ACCEPTABLE    Drawn by  RT    Sample type ARTERIAL    Comment NOTIFIED PHYSICIAN   Glucose, capillary     Status: Abnormal   Collection Time: 12/30/16  3:04 PM  Result Value Ref Range   Glucose-Capillary 134 (H) 65 - 99 mg/dL   Comment 1 Capillary Specimen    Comment 2 Notify RN   Lactic acid, plasma     Status: None   Collection Time: 12/30/16  3:08 PM  Result Value Ref Range   Lactic Acid, Venous 1.2 0.5 - 1.9 mmol/L  I-STAT 3, arterial blood gas (G3+)     Status: Abnormal   Collection Time: 12/30/16  4:05 PM  Result Value Ref Range   pH, Arterial 7.499 (H) 7.350 - 7.450   pCO2 arterial 41.4 32.0 - 48.0 mmHg   pO2, Arterial 98.0 83.0 - 108.0 mmHg   Bicarbonate 32.2 (H) 20.0 - 28.0 mmol/L   TCO2 33 (H) 22 - 32 mmol/L   O2 Saturation 98.0 %   Acid-Base Excess 8.0 (H) 0.0 - 2.0 mmol/L   Patient temperature 98.6 F    Collection site RADIAL, ALLEN'S TEST ACCEPTABLE    Drawn by RT    Sample type ARTERIAL   Glucose, capillary     Status: Abnormal   Collection  Time: 12/30/16  7:04 PM  Result Value Ref Range   Glucose-Capillary 128 (H) 65 - 99 mg/dL   Comment 1 Notify RN   Glucose, capillary     Status: Abnormal   Collection Time: 12/30/16 11:33 PM  Result Value Ref Range   Glucose-Capillary 100 (H) 65 - 99 mg/dL   Comment 1 Capillary Specimen   Comprehensive metabolic panel     Status: Abnormal   Collection Time: 12/31/16  3:11 AM  Result Value Ref Range   Sodium 136 135 - 145 mmol/L   Potassium 3.9 3.5 - 5.1 mmol/L   Chloride 101 101 - 111 mmol/L   CO2 30 22 - 32 mmol/L   Glucose, Bld 105 (H) 65 - 99 mg/dL   BUN <5 (L) 6 - 20 mg/dL   Creatinine, Ser 0.53 0.44 - 1.00 mg/dL   Calcium 8.2 (L) 8.9 - 10.3 mg/dL   Total Protein 5.8 (L) 6.5 - 8.1 g/dL   Albumin 3.0 (L) 3.5 - 5.0 g/dL   AST 24 15 - 41 U/L   ALT 13 (L) 14 - 54 U/L   Alkaline Phosphatase 95 38 - 126 U/L   Total Bilirubin 1.3 (H) 0.3 - 1.2 mg/dL   GFR calc non Af Amer >60 >60 mL/min   GFR calc Af Amer >60 >60 mL/min   Anion gap 5 5 - 15  Protime-INR     Status: None   Collection Time: 12/31/16  3:11 AM  Result Value Ref Range   Prothrombin Time 15.1 11.4 - 15.2 seconds   INR 1.20   Magnesium     Status: None   Collection Time: 12/31/16  3:11 AM  Result Value Ref Range   Magnesium 1.8 1.7 - 2.4 mg/dL  CBC with Differential/Platelet     Status: Abnormal   Collection Time: 12/31/16  3:11 AM  Result Value Ref Range   WBC 9.6 4.0 - 10.5 K/uL   RBC 3.46 (L) 3.87 - 5.11 MIL/uL   Hemoglobin 9.8 (L) 12.0 - 15.0 g/dL   HCT 32.1 (L) 36.0 - 46.0 %   MCV 92.8 78.0 - 100.0 fL   MCH 28.3 26.0 - 34.0 pg   MCHC 30.5 30.0 -  36.0 g/dL   RDW 14.7 11.5 - 15.5 %   Platelets 128 (L) 150 - 400 K/uL   Neutrophils Relative % 80 %   Neutro Abs 7.6 1.7 - 7.7 K/uL   Lymphocytes Relative 9 %   Lymphs Abs 0.9 0.7 - 4.0 K/uL   Monocytes Relative 11 %   Monocytes Absolute 1.1 (H) 0.1 - 1.0 K/uL   Eosinophils Relative 0 %   Eosinophils Absolute 0.0 0.0 - 0.7 K/uL   Basophils Relative 0 %    Basophils Absolute 0.0 0.0 - 0.1 K/uL  Phosphorus     Status: None   Collection Time: 12/31/16  3:11 AM  Result Value Ref Range   Phosphorus 2.7 2.5 - 4.6 mg/dL  Procalcitonin     Status: None   Collection Time: 12/31/16  3:11 AM  Result Value Ref Range   Procalcitonin <0.10 ng/mL  Glucose, capillary     Status: None   Collection Time: 12/31/16  3:25 AM  Result Value Ref Range   Glucose-Capillary 94 65 - 99 mg/dL   Comment 1 Capillary Specimen   Blood gas, arterial     Status: Abnormal   Collection Time: 12/31/16  3:44 AM  Result Value Ref Range   FIO2 40.00    Delivery systems VENTILATOR    Mode PRESSURE REGULATED VOLUME CONTROL    VT 420 mL   LHR 22 resp/min   Peep/cpap 5.0 cm H20   pH, Arterial 7.395 7.350 - 7.450   pCO2 arterial 47.6 32.0 - 48.0 mmHg   pO2, Arterial 121 (H) 83.0 - 108.0 mmHg   Bicarbonate 28.5 (H) 20.0 - 28.0 mmol/L   Acid-Base Excess 4.0 (H) 0.0 - 2.0 mmol/L   O2 Saturation 98.9 %   Patient temperature 98.6    Collection site LEFT RADIAL    Drawn by [id number]    Sample type ARTERIAL DRAW    Allens test (pass/fail) PASS PASS  Glucose, capillary     Status: None   Collection Time: 12/31/16  7:11 AM  Result Value Ref Range   Glucose-Capillary 97 65 - 99 mg/dL   Comment 1 Capillary Specimen    Comment 2 Notify RN      Studies/Results: Dg Chest Port 1 View  Result Date: 12/31/2016 CLINICAL DATA:  Respiratory failure with hypoxia. EXAM: PORTABLE CHEST 1 VIEW COMPARISON:  12/30/2016 . FINDINGS: Endotracheal tube and NG tube in stable position. Heart size stable. Lungs are clear. No pleural effusion or pneumothorax. IMPRESSION: 1. Lines and tubes in stable position. 2.  Low lung volumes.  No acute infiltrate.  Heart size stable. Electronically Signed   By: Marcello Moores  Register   On: 12/31/2016 06:16   Portable Chest Xray  Result Date: 12/30/2016 CLINICAL DATA:  Hypoxia EXAM: PORTABLE CHEST 1 VIEW COMPARISON:  December 21, 2016 FINDINGS: Endotracheal tube tip  is 2.9 cm above the carina. Nasogastric tube tip and side port are below the diaphragm. No pneumothorax. There is no edema or consolidation. The heart is upper normal in size with pulmonary vascularity within normal limits. No adenopathy. No bone lesions. IMPRESSION: Tube positions as described without pneumothorax. No edema or consolidation. Stable cardiac silhouette. Electronically Signed   By: Lowella Grip III M.D.   On: 12/30/2016 13:05   Dg Abd Portable 1v  Result Date: 12/30/2016 CLINICAL DATA:  Nasogastric tube placement. EXAM: PORTABLE ABDOMEN - 1 VIEW COMPARISON:  CT abdomen and pelvis December 21, 2016 FINDINGS: Nasogastric tube tip is in the distal third portion  the duodenum with the side port in the distal second portion of the duodenum. There is no bowel dilatation or air-fluid level to suggest bowel obstruction. No evident free air. IMPRESSION: Nasogastric tube tip and side-port in duodenum. Bowel gas pattern unremarkable. No free air evident. Electronically Signed   By: Lowella Grip III M.D.   On: 12/30/2016 13:06    . chlorhexidine gluconate (MEDLINE KIT)  15 mL Mouth Rinse BID  . enoxaparin (LOVENOX) injection  40 mg Subcutaneous Q24H  . folic acid  1 mg Intravenous Daily  . insulin aspart  0-9 Units Subcutaneous Q4H  . mouth rinse  15 mL Mouth Rinse 10 times per day  . nicotine  14 mg Transdermal Daily  . pantoprazole (PROTONIX) IV  40 mg Intravenous Q24H     Assessment/Plan: s/p Procedure(s): EXPLORATORY LAPAROTOMY WITH REPAIR ABDOMINAL WALL VENTRAL HERNIA APPLICATION OF WOUND VAC  POD #1:  Laparotomy, partial omentectomy, primary umbilical hernia repair with sutures, placement negative pressure dressing -No surgical or incisional problems POD 1. -Change VAC  Tomorrow -Vent wean per CCM.  Greatly appreciate their assistance.  Cirrhosis with varices secondary to alcohol History of multiple sclerosis Alcohol abuse   '@PROBHOSP' @  LOS: 1 day    Anne Black 12/31/2016  . .prob

## 2016-12-31 NOTE — Progress Notes (Signed)
Pt placed back on full support at this time due to decreased respiratory effort, will wait until pt more awake to wean

## 2016-12-31 NOTE — Progress Notes (Signed)
PULMONARY / CRITICAL CARE MEDICINE   Name: Anne Black MRN: [id number] DOB: 08-Nov-1960    ADMISSION DATE:  12/29/2016   CONSULTATION DATE:  12/30/2016  REFERRING MD:  Emelia LoronMatthew Wakefield, M.D. / CCS  CHIEF COMPLAINT:  Sepsis  HISTORY OF PRESENT ILLNESS:  56 y.o. female with long-standing history of alcohol abuse and underlying liver cirrhosis with history of ascites and paracentesis. Patient also has known multiple sclerosis [facility] as well as bipolar disorder. Presented yesterday to hospital with 2 day history of body cramps. Also endorses generalized malaise, fatigue, decreased appetite, and nausea. Patient endorsed drinking 4-5 glasses of wine daily on average with last alcoholic beverage on 11/20. Abdominal CT imaging in the emergency department on 11/11 showed a ventral wall hernia with surrounding edema. Patient endorsed new purulent drainage from open wound and surrounding erythema from abdominal wall hernia. Patient also endorsed constant pain. Was unaware of her last bowel movement or flatus. Patient taken to the operating room today for exploratory laparotomy. Patient underwent omental resection as well as repair of umbilical hernia. Returns to the ICU with abdominal wound VAC in place and on ventilator support.  SUBJECTIVE: No acute events overnight. Patient requiring intermittent sedation to tolerate endotracheal tube.   REVIEW OF SYSTEMS:  Unable to obtain as patient is intubated & sedated.  VITAL SIGNS: BP (!) 113/57   Pulse 73   Temp 99.2 F (37.3 C) (Oral)   Resp (!) 22   Ht 5\' 2"  (1.575 m)   Wt 171 lb 15.3 oz (78 kg)   SpO2 98%   BMI 31.45 kg/m   HEMODYNAMICS:    VENTILATOR SETTINGS: Vent Mode: PRVC FiO2 (%):  [40 %] 40 % Set Rate:  [14 bmp-28 bmp] 22 bmp Vt Set:  [400 mL-500 mL] 420 mL PEEP:  [5 cmH20] 5 cmH20 Plateau Pressure:  [17 cmH20-33 cmH20] 17 cmH20  INTAKE / OUTPUT: I/O last 3 completed shifts: In: 5467.7 [I.V.:3417.7; IV  Piggyback:2050] Out: 965 [Urine:890; Blood:75]  PHYSICAL EXAMINATION: Gen.: No distress. No family at bedside. Comfortable. Integument: Warm. Dry. No rash. No erythema surrounding abdominal wound VAC. Abdomen: Soft. Protuberant. Wound VAC in place. Neurological: Grossly nonfocal. Attends to voice. Following commands. Cardiovascular: Regular rate. No JVD. No edema. Pulmonary: Symmetric chest wall expansion on ventilator. Clear bilaterally with auscultation. HEENT: Moist mucus membranes. Endotracheal tube in place. Pupils symmetric.  LABS:  BMET Recent Labs  Lab 12/30/16 0909 12/30/16 1240 12/31/16 0311  NA 134* 133* 136  K 2.8* 3.0* 3.9  CL 95* 95* 101  CO2 30 30 30   BUN <5* <5* <5*  CREATININE 0.46 0.50 0.53  GLUCOSE 120* 146* 105*    Electrolytes Recent Labs  Lab 12/29/16 2117 12/30/16 0909 12/30/16 1240 12/31/16 0311  CALCIUM 8.6* 8.0* 8.0* 8.2*  MG 1.4* 2.1  --  1.8  PHOS  --   --   --  2.7    CBC Recent Labs  Lab 12/29/16 2117 12/30/16 0126 12/31/16 0311  WBC 11.0* 9.0 9.6  HGB 12.8 12.0 9.8*  HCT 40.0 37.6 32.1*  PLT 182 154 128*    Coag's Recent Labs  Lab 12/25/16 1911 12/30/16 0415 12/31/16 0311  APTT  --  34  --   INR 1.11 1.30 1.20    Sepsis Markers Recent Labs  Lab 12/30/16 1240 12/30/16 1508 12/31/16 0311  LATICACIDVEN  --  1.2  --   PROCALCITON <0.10  --  <0.10    ABG Recent Labs  Lab 12/30/16  1443 12/30/16 1605 12/31/16 0344  PHART 7.275* 7.499* 7.395  PCO2ART 75.2* 41.4 47.6  PO2ART 145.0* 98.0 121*    Liver Enzymes Recent Labs  Lab 12/25/16 1828 12/29/16 2117 12/31/16 0311  AST 49* 41 24  ALT 25 22 13*  ALKPHOS 137* 143* 95  BILITOT 0.8 1.5* 1.3*  ALBUMIN 3.9 4.2 3.0*    Cardiac Enzymes No results for input(s): TROPONINI, PROBNP in the last 168 hours.  Glucose Recent Labs  Lab 12/30/16 1225 12/30/16 1504 12/30/16 1904 12/30/16 2333 12/31/16 0325 12/31/16 0711  GLUCAP 138* 134* 128* 100* 94 97     Imaging Dg Chest Port 1 View  Result Date: 12/31/2016 CLINICAL DATA:  Respiratory failure with hypoxia. EXAM: PORTABLE CHEST 1 VIEW COMPARISON:  12/30/2016 . FINDINGS: Endotracheal tube and NG tube in stable position. Heart size stable. Lungs are clear. No pleural effusion or pneumothorax. IMPRESSION: 1. Lines and tubes in stable position. 2.  Low lung volumes.  No acute infiltrate.  Heart size stable. Electronically Signed   By: Maisie Fus  Register   On: 12/31/2016 06:16   Portable Chest Xray  Result Date: 12/30/2016 CLINICAL DATA:  Hypoxia EXAM: PORTABLE CHEST 1 VIEW COMPARISON:  December 21, 2016 FINDINGS: Endotracheal tube tip is 2.9 cm above the carina. Nasogastric tube tip and side port are below the diaphragm. No pneumothorax. There is no edema or consolidation. The heart is upper normal in size with pulmonary vascularity within normal limits. No adenopathy. No bone lesions. IMPRESSION: Tube positions as described without pneumothorax. No edema or consolidation. Stable cardiac silhouette. Electronically Signed   By: Bretta Bang III M.D.   On: 12/30/2016 13:05   Dg Abd Portable 1v  Result Date: 12/30/2016 CLINICAL DATA:  Nasogastric tube placement. EXAM: PORTABLE ABDOMEN - 1 VIEW COMPARISON:  CT abdomen and pelvis December 21, 2016 FINDINGS: Nasogastric tube tip is in the distal third portion the duodenum with the side port in the distal second portion of the duodenum. There is no bowel dilatation or air-fluid level to suggest bowel obstruction. No evident free air. IMPRESSION: Nasogastric tube tip and side-port in duodenum. Bowel gas pattern unremarkable. No free air evident. Electronically Signed   By: Bretta Bang III M.D.   On: 12/30/2016 13:06     STUDIES:  CT ABDOMEN/PELVIS 11/13: IMPRESSION: 1. Changes of hepatic cirrhosis again noted. Mild gastric and esophageal varices seen. Nonspecific focus of contrast blush again seen at the left hepatic lobe. 2. Small volume  ascites noted tracking about the liver. 3. Prominent moderate anterior abdominal wall hernia, new from 2017, with associated diffuse soft tissue edema. No evidence of bowel herniation at this time. 4. Mild scarring at the left lingula. 5.  Aortic Atherosclerosis (ICD10-I70.0).  PORT CXR 11/23:  Personally reviewed by me. Enteric feeding tube coursing below diaphragm. Endotracheal tube in good position. No focal opacity appreciated.   MICROBIOLOGY: MRSA PCR 11/22:  Negative  Blood Cultures x2 11/22 >>>  ANTIBIOTICS: Vancomycin 11/22 >>> Zosyn 11/22 >>>  SIGNIFICANT EVENTS: 11/21 - Presented to [facility] 11/22 - Ex Lap for incarcerated omental hernia w/ wound vac placement >> returned to ICU on vent  LINES/TUBES: OETT 11/22 >>> L NGT 11/22 >>> Foley >>> PIV  ASSESSMENT / PLAN:  PULMONARY A: Acute Hypoxic Respiratory Failure Tobacco Use Disorder:  Currently smoking 1ppd.   P:   Initiating pressure support wean Holding Advair Continuing Duonebs as needed every 6 hours Continuing nicotine patch 14 mg daily Anticipate possible extubation today  CARDIOVASCULAR A:  No acute issues.  P:  Continuous monitoring on telemetry Vital signs per unit protocol Maintaining MAP >65  RENAL A:   Hypokalemia: Resolved.  P:   Continuing to monitor urine output with Foley catheter Trending electrolytes and renal function daily Holding diuretic therapy  GASTROINTESTINAL A:   Esophageal & Gastric Varices:  Noted on abdominal CT scan 11/13. Liver Cirrhosis w/ Ascites:  H/O paracentesis. Secondary to EtOH.  P:   NPO. Holding Aldactone & Lasix  HEMATOLOGIC A:   Anemia: Mild. No signs of active bleeding. Thrombocytopenia: Mild worsening. Suspect secondary to consumption.  P:  Continuing to trend cell counts daily with CBC.  INFECTIOUS A:   SIRS vs Sepsis:  Secondary to incarcerated omental hernia.  P:   Continuing empiric vancomycin & Zosyn Awaiting finalization of  blood cultures  ENDOCRINE A:   Hyperglycemia:  Mild. No h/o DM. A1C 5.0.   P:   Continuing Accu-Cheks every 4 hours Continuing slight scale insulin per sensitive algorithm  NEUROLOGIC A:   Sedation on ventilator  History of bipolar disorder & anxiety History of multiple sclerosis: Followed at [facility].  Possible H/O Hepatic Encephalopathy  P:   RASS goal: 0 to -1 Tapering fentanyl drip in anticipation of extubation Versed IV prn sedation Holding Risperdal, Prozac, BuSpar, & Neurontin but plan to restart postextubation Holding Xifaxan but plan to restart postextubation Thiamine & folic acid IV daily and plan to start oral vitamin supplementation postextubation  Prophylaxis: SCDs, Protonix IV every 12 hours, & Lovenox subcutaneous daily.. Diet: Nothing by mouth in anticipation of extubation today. Code Status: Full code as per previous physician discussions. Disposition: Patient to remain in ICU while on mechanical ventilation and intubated. Family Discussion: No family present during rounds this morning.   DISCUSSION:  56 y.o.  female with long-standing history of tobacco and alcohol use. Admitted with incarcerated omental hernia. Early sirs versus sepsis. Patient clinically stable postoperatively. Initiating pressure support wean in anticipation of extubation today. Continuing broad-spectrum antibiotics for now.  I have spent a total of 31 minutes of critical care time today caring for the patient and reviewing the patient's electronic medical record.   Donna Christen Jamison Neighbor, M.D. [facility] Pulmonary & Critical Care Pager:  [phone] After 7pm or if no response, call [phone] 12/31/2016, 8:03 AM

## 2016-12-31 NOTE — Procedures (Signed)
Extubation Procedure Note  Patient Details:   Name: Ladon ApplebaumCarla S Pudlo DOB: [birth year] MRN: [id number]       Evaluation  O2 sats: stable throughout Complications: No apparent complications Patient did tolerate procedure well. Bilateral Breath Sounds: Clear, Diminished   Pt extubated per MD order at this time. Pt placed on 4L [place] tolerating well, has strong cough and able to voice.   Cherylin MylarDoyle, Rachid Parham 12/31/2016, 11:45 AM

## 2016-12-31 NOTE — Progress Notes (Signed)
Critical ABG shown to MD [facility].

## 2016-12-31 NOTE — Progress Notes (Signed)
PCCM Attending Re-Rounding Note:  Patient assess this evening with decreased oxygenation during periods of sleep and questionable apnea. Patient awakens with stimulation. Assessed at bedside. Somewhat confused. Ordering stat BiPAP to maintain saturation. Lasix 40 mg IV 1 ordered. Continuing close monitoring for possible reintubation.  Donna ChristenJennings E. Jamison NeighborNestor, M.D. [facility] Pulmonary & Critical Care Pager:  [phone] After 7pm or if no response, call [phone] 5:44 PM 12/31/16

## 2016-12-31 NOTE — Progress Notes (Signed)
PCCM Attending SBT Note:  Patient seen on spontaneous breathing trial partial 40/5 with FiO2 0.4. Respiratory rate approximately 12 breast per minute. Patient able to perform greater than 1000 mL tidal volume. Hemodynamically stable. More awake and alert all fentanyl drip. Order placed for extubation pending cuff leak check.  Donna Christen Jamison Neighbor, M.D. [facility] Pulmonary & Critical Care Pager:  [phone] After 7pm or if no response, call [phone] 11:47 AM 12/31/16

## 2017-01-01 LAB — GLUCOSE, CAPILLARY
GLUCOSE-CAPILLARY: 102 mg/dL — AB (ref 65–99)
GLUCOSE-CAPILLARY: 88 mg/dL (ref 65–99)
GLUCOSE-CAPILLARY: 94 mg/dL (ref 65–99)
GLUCOSE-CAPILLARY: 98 mg/dL (ref 65–99)
Glucose-Capillary: 116 mg/dL — ABNORMAL HIGH (ref 65–99)
Glucose-Capillary: 94 mg/dL (ref 65–99)
Glucose-Capillary: 96 mg/dL (ref 65–99)
Glucose-Capillary: 99 mg/dL (ref 65–99)

## 2017-01-01 LAB — CBC WITH DIFFERENTIAL/PLATELET
BASOS ABS: 0 10*3/uL (ref 0.0–0.1)
BASOS PCT: 0 %
EOS PCT: 0 %
Eosinophils Absolute: 0 10*3/uL (ref 0.0–0.7)
HCT: 32 % — ABNORMAL LOW (ref 36.0–46.0)
Hemoglobin: 9.6 g/dL — ABNORMAL LOW (ref 12.0–15.0)
Lymphocytes Relative: 10 %
Lymphs Abs: 0.9 10*3/uL (ref 0.7–4.0)
MCH: 28.6 pg (ref 26.0–34.0)
MCHC: 30 g/dL (ref 30.0–36.0)
MCV: 95.2 fL (ref 78.0–100.0)
MONO ABS: 0.9 10*3/uL (ref 0.1–1.0)
MONOS PCT: 10 %
Neutro Abs: 7.3 10*3/uL (ref 1.7–7.7)
Neutrophils Relative %: 80 %
Platelets: 131 10*3/uL — ABNORMAL LOW (ref 150–400)
RBC: 3.36 MIL/uL — ABNORMAL LOW (ref 3.87–5.11)
RDW: 14.7 % (ref 11.5–15.5)
WBC: 9.1 10*3/uL (ref 4.0–10.5)

## 2017-01-01 LAB — RENAL FUNCTION PANEL
ANION GAP: 6 (ref 5–15)
Albumin: 3.1 g/dL — ABNORMAL LOW (ref 3.5–5.0)
BUN: <5 mg/dL — ABNORMAL LOW (ref 6–20)
CALCIUM: 8 mg/dL — AB (ref 8.9–10.3)
CO2: 33 mmol/L — AB (ref 22–32)
Chloride: 100 mmol/L — ABNORMAL LOW (ref 101–111)
Creatinine, Ser: 0.47 mg/dL (ref 0.44–1.00)
GFR calc non Af Amer: >60 mL/min (ref >60)
Glucose, Bld: 105 mg/dL — ABNORMAL HIGH (ref 65–99)
POTASSIUM: 2.7 mmol/L — AB (ref 3.5–5.1)
Phosphorus: 3.4 mg/dL (ref 2.5–4.6)
SODIUM: 139 mmol/L (ref 135–145)

## 2017-01-01 LAB — BASIC METABOLIC PANEL
Anion gap: 5 (ref 5–15)
CALCIUM: 7.9 mg/dL — AB (ref 8.9–10.3)
CHLORIDE: 97 mmol/L — AB (ref 101–111)
CO2: 32 mmol/L (ref 22–32)
CREATININE: 0.4 mg/dL — AB (ref 0.44–1.00)
GFR calc non Af Amer: >60 mL/min (ref >60)
Glucose, Bld: 104 mg/dL — ABNORMAL HIGH (ref 65–99)
Potassium: 3.4 mmol/L — ABNORMAL LOW (ref 3.5–5.1)
SODIUM: 134 mmol/L — AB (ref 135–145)

## 2017-01-01 LAB — MAGNESIUM: MAGNESIUM: 1.5 mg/dL — AB (ref 1.7–2.4)

## 2017-01-01 LAB — PROCALCITONIN

## 2017-01-01 MED ORDER — MAGNESIUM SULFATE 4 GM/100ML IV SOLN
4.0000 g | Freq: Once | INTRAVENOUS | Status: AC
  Administered 2017-01-01: 4 g via INTRAVENOUS
  Filled 2017-01-01 (×2): qty 100

## 2017-01-01 MED ORDER — SODIUM CHLORIDE 0.9 % IV SOLN
0.4000 ug/kg/h | INTRAVENOUS | Status: DC
  Administered 2017-01-01: 0.7 ug/kg/h via INTRAVENOUS
  Administered 2017-01-01: 0.4 ug/kg/h via INTRAVENOUS
  Filled 2017-01-01 (×2): qty 2

## 2017-01-01 MED ORDER — ADULT MULTIVITAMIN W/MINERALS CH
1.0000 | ORAL_TABLET | Freq: Every day | ORAL | Status: DC
  Administered 2017-01-01 – 2017-01-08 (×7): 1 via ORAL
  Filled 2017-01-01 (×11): qty 1

## 2017-01-01 MED ORDER — POTASSIUM CHLORIDE 10 MEQ/100ML IV SOLN
10.0000 meq | INTRAVENOUS | Status: AC
  Administered 2017-01-01 (×6): 10 meq via INTRAVENOUS
  Filled 2017-01-01 (×6): qty 100

## 2017-01-01 MED ORDER — DEXMEDETOMIDINE HCL IN NACL 400 MCG/100ML IV SOLN
0.4000 ug/kg/h | INTRAVENOUS | Status: DC
  Administered 2017-01-01: 0.7 ug/kg/h via INTRAVENOUS
  Administered 2017-01-02: 0.8 ug/kg/h via INTRAVENOUS
  Administered 2017-01-03: 0.4 ug/kg/h via INTRAVENOUS
  Filled 2017-01-01 (×5): qty 100

## 2017-01-01 MED ORDER — VITAMIN B-1 100 MG PO TABS
100.0000 mg | ORAL_TABLET | Freq: Every day | ORAL | Status: DC
  Administered 2017-01-01 – 2017-01-02 (×2): 100 mg via ORAL
  Filled 2017-01-01 (×2): qty 1

## 2017-01-01 MED ORDER — FOLIC ACID 1 MG PO TABS
1.0000 mg | ORAL_TABLET | Freq: Every day | ORAL | Status: DC
  Administered 2017-01-01 – 2017-01-02 (×2): 1 mg via ORAL
  Filled 2017-01-01 (×2): qty 1

## 2017-01-01 MED ORDER — LORAZEPAM 2 MG/ML IJ SOLN
1.0000 mg | INTRAMUSCULAR | Status: DC | PRN
  Administered 2017-01-01 – 2017-01-02 (×8): 2 mg via INTRAVENOUS
  Filled 2017-01-01 (×8): qty 1

## 2017-01-01 NOTE — Progress Notes (Signed)
CRITICAL VALUE ALERT  Critical Value: 2.7  Date & Time Notied:  11/24 0327  Provider Notified: Pola Corn MD aware  Orders Received/Actions taken: Awaiting orders, will continue to monitor.

## 2017-01-01 NOTE — Progress Notes (Signed)
PULMONARY / CRITICAL CARE MEDICINE   Name: JAHNELL KOWALICK MRN: [id number] DOB: [birth year]    ADMISSION DATE:  12/29/2016   CONSULTATION DATE:  12/30/2016  REFERRING MD:  [name], M.D. / CCS  CHIEF COMPLAINT:  Sepsis  HISTORY OF PRESENT ILLNESS:  56 y.o. female with long-standing history of alcohol abuse and underlying liver cirrhosis with history of ascites and paracentesis. Patient also has known multiple sclerosis followed at [facility] as well as bipolar disorder. Presented yesterday to hospital with 2 day history of body cramps. Also endorses generalized malaise, fatigue, decreased appetite, and nausea. Patient endorsed drinking 4-5 glasses of wine daily on average with last alcoholic beverage on 11/20. Abdominal CT imaging in the emergency department on 11/11 showed a ventral wall hernia with surrounding edema. Patient endorsed new purulent drainage from open wound and surrounding erythema from abdominal wall hernia. Patient also endorsed constant pain. Was unaware of her last bowel movement or flatus. Patient taken to the operating room today for exploratory laparotomy. Patient underwent omental resection as well as repair of umbilical hernia. Returns to the ICU with abdominal wound VAC in place and on ventilator support.  SUBJECTIVE: Sleepy but arousable.  Chest wall paradoxus is noted.  Hemodynamic stable.  REVIEW OF SYSTEMS:  Unable to obtain as patient is intubated & sedated.  VITAL SIGNS: BP (!) 104/59   Pulse 82   Temp 98.5 F (36.9 C) (Oral)   Resp 12   Ht 5\' 2"  (1.575 m)   Wt 171 lb 15.3 oz (78 kg)   SpO2 97%   BMI 31.45 kg/m   HEMODYNAMICS:    VENTILATOR SETTINGS: Vent Mode: BIPAP FiO2 (%):  [40 %-50 %] 50 % Set Rate:  [8 bmp-16 bmp] 15 bmp PEEP:  [6 cmH20] 6 cmH20  INTAKE / OUTPUT: I/O last 3 completed shifts: In: 4488.8 [I.V.:4088.8; IV Piggyback:400] Out: 1665 [Urine:1665]  PHYSICAL EXAMINATION: General appearing female who is arousable but  sleepy. Neuro; arouses to voice Cardiovascular; heart sounds are regular regular rate and rhythm Pulmonary abdominal chest wall paradoxus noted.  Good air movement Gi: Wound VAC in place Ext: Warm and dry  LABS:  BMET Recent Labs  Lab 12/30/16 1240 12/31/16 0311 01/01/17 0223  NA 133* 136 139  K 3.0* 3.9 2.7*  CL 95* 101 100*  CO2 30 30 33*  BUN <5* <5* <5*  CREATININE 0.50 0.53 0.47  GLUCOSE 146* 105* 105*    Electrolytes Recent Labs  Lab 12/30/16 0909 12/30/16 1240 12/31/16 0311 01/01/17 0223  CALCIUM 8.0* 8.0* 8.2* 8.0*  MG 2.1  --  1.8 1.5*  PHOS  --   --  2.7 3.4    CBC Recent Labs  Lab 12/30/16 0126 12/31/16 0311 01/01/17 0223  WBC 9.0 9.6 9.1  HGB 12.0 9.8* 9.6*  HCT 37.6 32.1* 32.0*  PLT 154 128* 131*    Coag's Recent Labs  Lab 12/25/16 1911 12/30/16 0415 12/31/16 0311  APTT  --  34  --   INR 1.11 1.30 1.20    Sepsis Markers Recent Labs  Lab 12/30/16 1240 12/30/16 1508 12/31/16 0311 01/01/17 0223  LATICACIDVEN  --  1.2  --   --   PROCALCITON <0.10  --  <0.10 <0.10    ABG Recent Labs  Lab 12/30/16 1605 12/31/16 0344 12/31/16 1757  PHART 7.499* 7.395 7.261*  PCO2ART 41.4 47.6 68.3*  PO2ART 98.0 121* 167.0*    Liver Enzymes Recent Labs  Lab 12/25/16 1828 12/29/16 2117 12/31/16 0311 01/01/17  0223  AST 49* 41 24  --   ALT 25 22 13*  --   ALKPHOS 137* 143* 95  --   BILITOT 0.8 1.5* 1.3*  --   ALBUMIN 3.9 4.2 3.0* 3.1*    Cardiac Enzymes No results for input(s): TROPONINI, PROBNP in the last 168 hours.  Glucose Recent Labs  Lab 12/31/16 1514 12/31/16 1907 01/01/17 0008 01/01/17 0305 01/01/17 0739 01/01/17 1132  GLUCAP 104* 98 94 94 88 116*    Imaging No results found.   STUDIES:  CT ABDOMEN/PELVIS 11/13: IMPRESSION: 1. Changes of hepatic cirrhosis again noted. Mild gastric and esophageal varices seen. Nonspecific focus of contrast blush again seen at the left hepatic lobe. 2. Small volume ascites  noted tracking about the liver. 3. Prominent moderate anterior abdominal wall hernia, new from 2017, with associated diffuse soft tissue edema. No evidence of bowel herniation at this time. 4. Mild scarring at the left lingula. 5.  Aortic Atherosclerosis (ICD10-I70.0).  PORT CXR 11/23:  Personally reviewed by me. Enteric feeding tube coursing below diaphragm. Endotracheal tube in good position. No focal opacity appreciated.  No chest x-ray from 01/01/2017 MICROBIOLOGY: MRSA PCR 11/22:  Negative  Blood Cultures x2 11/22 >>>  ANTIBIOTICS: Vancomycin 11/22 >>> Zosyn 11/22 >>>  SIGNIFICANT EVENTS: 11/21 - [facility] - Ex Lap for incarcerated omental hernia w/ wound vac placement >> returned to ICU on vent  LINES/TUBES: OETT 11/22 >>> 12/31/2016 L NGT 11/22 >>> 12/29/2016 wound VAC Foley >>> PIV  ASSESSMENT / PLAN:  PULMONARY A: Acute Hypoxic Respiratory Failure Tobacco Use Disorder:  Currently smoking 1ppd.   P:   Extubated 01/01/2007 Transition via BiPAP. Currently off BiPAP. Hold sedation Current cough and deep breathe as tolerated. CARDIOVASCULAR A:  No acute issues.  P:  Continuous monitoring on telemetry Vital signs per unit protocol Maintaining MAP >65  RENAL Lab Results  Component Value Date   CREATININE 0.47 01/01/2017   CREATININE 0.53 12/31/2016   CREATININE 0.50 12/30/2016   CREATININE 0.37 (L) 12/30/2015   CREATININE 0.45 (L) 10/14/2015   CREATININE 0.55 06/19/2015   Recent Labs  Lab 12/30/16 1240 12/31/16 0311 01/01/17 0223  K 3.0* 3.9 2.7*    A:   Hypokalemia:  P:   Continuing to monitor urine output with Foley catheter Trending electrolytes and renal function daily Holding diuretic therapy  GASTROINTESTINAL A:   Esophageal & Gastric Varices:  Noted on abdominal CT scan 11/13. Liver Cirrhosis w/ Ascites:  H/O paracentesis. Secondary to EtOH.  P:   NPO. Holding Aldactone & Lasix  HEMATOLOGIC Recent Labs     12/31/16 0311 01/01/17 0223  HGB 9.8* 9.6*    A:   Anemia: Mild. No signs of active bleeding. Thrombocytopenia: Mild worsening. Suspect secondary to consumption.  P:  Continuing to trend cell counts daily with CBC.  INFECTIOUS A:   SIRS vs Sepsis:  Secondary to incarcerated omental hernia.  P:   Continuing empiric vancomycin & Zosyn Awaiting finalization of blood cultures  ENDOCRINE CBG (last 3)  Recent Labs    01/01/17 0305 01/01/17 0739 01/01/17 1132  GLUCAP 94 88 116*    A:   Hyperglycemia:  Mild. No h/o DM. A1C 5.0.   P:   Continuing Accu-Cheks every 4 hours Continuing slight scale insulin per sensitive algorithm  NEUROLOGIC A:   Sedation on ventilator  History of bipolar disorder & anxiety History of multiple sclerosis: [facility].  Possible H/O Hepatic Encephalopathy  P:   RASS  goal: 0  DC fentanyl drip DC IV Versed Versed IV prn sedation Holding Risperdal, Prozac, BuSpar, & Neurontin but plan to restart postextubation Holding Xifaxan but plan to restart postextubation when awake Thiamine & folic acid IV daily and plan to start oral vitamin supplementation postextubation  Prophylaxis: SCDs, Protonix IV every 12 hours, & Lovenox subcutaneous daily.. Diet: Nothing by mouth in anticipation of extubation today. Code Status: Full code as per previous physician discussions. Disposition: Patient to remain in ICU while on mechanical ventilation and intubated. Family Discussion: No family present during rounds this morning.   DISCUSSION:  56 y.o.  female with long-standing history of tobacco and alcohol use. Admitted with incarcerated omental hernia. Early sirs versus sepsis. Patient clinically stable postoperatively. Initiating pressure support wean in anticipation of extubation today. Continuing broad-spectrum antibiotics for now.  Extubated 03/02/2016 to BiPAP and now nasal cannula.  Brett Canales Breella Vanostrand ACNP [facility] PCCM Pager [phone] till 1 pm If no  answer page [phone] 01/01/2017, 2:09 PM

## 2017-01-01 NOTE — Progress Notes (Signed)
Patient w/ much increased agitation in last hour. Combative and demanding to "let be to go home". Patient able to distinguish that she is at [facility] hospital: cannot verbalize why she is here and refuses that she has had surgery, sometimes states that she is here for "detox".  Ativan effective for 35 min and then agitation returned.Brett Canales minor at bs, updated and orders recvd.

## 2017-01-01 NOTE — Progress Notes (Signed)
200cc of Fentanyl wasted in sink. Witnessed by Floy SabinaAshley Clark, RN.

## 2017-01-01 NOTE — Progress Notes (Signed)
eLink Physician-Brief Progress Note Patient Name: Anne Black DOB: March 24, 1960 MRN: [id number]   Date of Service  01/01/2017  HPI/Events of Note  Hypokalemia and hypomag  eICU Interventions  Potassium and Mag replaced     Intervention Category Intermediate Interventions: Electrolyte abnormality - evaluation and management  DETERDING,ELIZABETH 01/01/2017, 3:28 AM

## 2017-01-01 NOTE — Progress Notes (Signed)
2 Days Post-Op  Subjective: Extubated at noon yesterday. BiPAP dependent Less agitated but slightly tremulous CIWA orders updated  Says she would like something to eat or drink. Potassium 2.7.  6 runs of potassium already ordered. Hemoglobin 9.6.  Platelet count  131,000; creatinine 0.47.  Albumin 3.1.  Objective: Vital signs in last 24 hours: Temp:  [98.3 F (36.8 C)-99.3 F (37.4 C)] 98.3 F (36.8 C) (11/24 0737) Pulse Rate:  [69-96] 74 (11/24 0805) Resp:  [6-28] 15 (11/24 0805) BP: (85-141)/(52-95) 107/53 (11/24 0805) SpO2:  [85 %-100 %] 95 % (11/24 0805) FiO2 (%):  [40 %-50 %] 50 % (11/24 0734)    Intake/Output from previous day: 11/23 0701 - 11/24 0700 In: 2658.8 [I.V.:2508.8; IV Piggyback:150] Out: 1225 [Urine:1225] Intake/Output this shift: Total I/O In: 212.5 [IV Piggyback:212.5] Out: 75 [Urine:75]   General appearance:  extubated.  Mild distress.  Mild agitation.  Answers questions appropriately.  BiPAP in place. Resp: Coarse breath sounds bilaterally GI: Soft.  Appropriate incisional tenderness.  Negative pressure dressing in place no significant guarding.  Bowel sounds present.     Lab Results:  Results for orders placed or performed during the hospital encounter of 12/29/16 (from the past 24 hour(s))  Glucose, capillary     Status: Abnormal   Collection Time: 12/31/16 11:01 AM  Result Value Ref Range   Glucose-Capillary 109 (H) 65 - 99 mg/dL   Comment 1 Capillary Specimen    Comment 2 Notify RN   Glucose, capillary     Status: Abnormal   Collection Time: 12/31/16  3:14 PM  Result Value Ref Range   Glucose-Capillary 104 (H) 65 - 99 mg/dL   Comment 1 Capillary Specimen   I-STAT 3, arterial blood gas (G3+)     Status: Abnormal   Collection Time: 12/31/16  5:57 PM  Result Value Ref Range   pH, Arterial 7.261 (L) 7.350 - 7.450   pCO2 arterial 68.3 (HH) 32.0 - 48.0 mmHg   pO2, Arterial 167.0 (H) 83.0 - 108.0 mmHg   Bicarbonate 30.7 (H) 20.0 - 28.0  mmol/L   TCO2 33 (H) 22 - 32 mmol/L   O2 Saturation 99.0 %   Acid-Base Excess 2.0 0.0 - 2.0 mmol/L   Patient temperature 98.6 F    Collection site RADIAL, ALLEN'S TEST ACCEPTABLE    Drawn by RT    Sample type ARTERIAL    Comment NOTIFIED PHYSICIAN   Glucose, capillary     Status: None   Collection Time: 12/31/16  7:07 PM  Result Value Ref Range   Glucose-Capillary 98 65 - 99 mg/dL   Comment 1 Notify RN   Glucose, capillary     Status: None   Collection Time: 01/01/17 12:08 AM  Result Value Ref Range   Glucose-Capillary 94 65 - 99 mg/dL  Procalcitonin     Status: None   Collection Time: 01/01/17  2:23 AM  Result Value Ref Range   Procalcitonin <0.10 ng/mL  Magnesium     Status: Abnormal   Collection Time: 01/01/17  2:23 AM  Result Value Ref Range   Magnesium 1.5 (L) 1.7 - 2.4 mg/dL  CBC with Differential/Platelet     Status: Abnormal   Collection Time: 01/01/17  2:23 AM  Result Value Ref Range   WBC 9.1 4.0 - 10.5 K/uL   RBC 3.36 (L) 3.87 - 5.11 MIL/uL   Hemoglobin 9.6 (L) 12.0 - 15.0 g/dL   HCT 34.0 (L) 37.0 - 96.4 %   MCV 95.2 78.0 -  100.0 fL   MCH 28.6 26.0 - 34.0 pg   MCHC 30.0 30.0 - 36.0 g/dL   RDW 16.114.7 09.611.5 - 04.515.5 %   Platelets 131 (L) 150 - 400 K/uL   Neutrophils Relative % 80 %   Neutro Abs 7.3 1.7 - 7.7 K/uL   Lymphocytes Relative 10 %   Lymphs Abs 0.9 0.7 - 4.0 K/uL   Monocytes Relative 10 %   Monocytes Absolute 0.9 0.1 - 1.0 K/uL   Eosinophils Relative 0 %   Eosinophils Absolute 0.0 0.0 - 0.7 K/uL   Basophils Relative 0 %   Basophils Absolute 0.0 0.0 - 0.1 K/uL  Renal function panel     Status: Abnormal   Collection Time: 01/01/17  2:23 AM  Result Value Ref Range   Sodium 139 135 - 145 mmol/L   Potassium 2.7 (LL) 3.5 - 5.1 mmol/L   Chloride 100 (L) 101 - 111 mmol/L   CO2 33 (H) 22 - 32 mmol/L   Glucose, Bld 105 (H) 65 - 99 mg/dL   BUN <5 (L) 6 - 20 mg/dL   Creatinine, Ser 4.090.47 0.44 - 1.00 mg/dL   Calcium 8.0 (L) 8.9 - 10.3 mg/dL   Phosphorus 3.4  2.5 - 4.6 mg/dL   Albumin 3.1 (L) 3.5 - 5.0 g/dL   GFR calc non Af Amer >60 >60 mL/min   GFR calc Af Amer >60 >60 mL/min   Anion gap 6 5 - 15  Glucose, capillary     Status: None   Collection Time: 01/01/17  3:05 AM  Result Value Ref Range   Glucose-Capillary 94 65 - 99 mg/dL   Comment 1 Notify RN      Studies/Results: No results found.  . enoxaparin (LOVENOX) injection  40 mg Subcutaneous Q24H  . folic acid  1 mg Oral Daily  . folic acid  1 mg Intravenous Daily  . insulin aspart  0-9 Units Subcutaneous Q4H  . mouth rinse  15 mL Mouth Rinse BID  . multivitamin with minerals  1 tablet Oral Daily  . nicotine  14 mg Transdermal Daily  . pantoprazole (PROTONIX) IV  40 mg Intravenous Q24H  . thiamine  100 mg Oral Daily     Assessment/Plan: s/p Procedure(s): EXPLORATORY LAPAROTOMY WITH REPAIR ABDOMINAL WALL VENTRAL HERNIA APPLICATION OF WOUND VAC   POD #2:  Laparotomy, partial omentectomy, primary umbilical hernia repair with sutures, placement negative pressure dressing -No surgical or incisional problems POD 1. -Change VAC   today, then Monday Wednesday Friday. -Clear liquids once she is not Dependent -CIWA protocol and meds ordered - Management pulmonary status per CCM.  Greatly appreciate their assistance.   Hypokalemia.  This has been addressed.  Check labs tomorrow. Cirrhosis with varices secondary to alcohol    CIWA History of multiple sclerosis Alcohol abuse    @PROBHOSP @  LOS: 2 days    Ernestene MentionHaywood M Gianne Shugars 01/01/2017  . .prob

## 2017-01-02 ENCOUNTER — Inpatient Hospital Stay (HOSPITAL_COMMUNITY): Payer: 59

## 2017-01-02 DIAGNOSIS — G9340 Encephalopathy, unspecified: Secondary | ICD-10-CM

## 2017-01-02 LAB — POCT I-STAT 3, ART BLOOD GAS (G3+)
ACID-BASE EXCESS: 4 mmol/L — AB (ref 0.0–2.0)
BICARBONATE: 30.9 mmol/L — AB (ref 20.0–28.0)
O2 Saturation: 91 %
TCO2: 33 mmol/L — AB (ref 22–32)
pCO2 arterial: 56.7 mmHg — ABNORMAL HIGH (ref 32.0–48.0)
pH, Arterial: 7.342 — ABNORMAL LOW (ref 7.350–7.450)
pO2, Arterial: 64 mmHg — ABNORMAL LOW (ref 83.0–108.0)

## 2017-01-02 LAB — AMMONIA: Ammonia: 68 umol/L — ABNORMAL HIGH (ref 9–35)

## 2017-01-02 LAB — GLUCOSE, CAPILLARY
GLUCOSE-CAPILLARY: 105 mg/dL — AB (ref 65–99)
GLUCOSE-CAPILLARY: 99 mg/dL (ref 65–99)
GLUCOSE-CAPILLARY: 109 mg/dL — AB (ref 65–99)
GLUCOSE-CAPILLARY: 91 mg/dL (ref 65–99)
GLUCOSE-CAPILLARY: 88 mg/dL (ref 65–99)
Glucose-Capillary: 91 mg/dL (ref 65–99)

## 2017-01-02 LAB — CBC WITH DIFFERENTIAL/PLATELET
BASOS ABS: 0 10*3/uL (ref 0.0–0.1)
BASOS PCT: 0 %
EOS PCT: 5 %
Eosinophils Absolute: 0.4 10*3/uL (ref 0.0–0.7)
HEMATOCRIT: 32.3 % — AB (ref 36.0–46.0)
Hemoglobin: 9.7 g/dL — ABNORMAL LOW (ref 12.0–15.0)
Lymphocytes Relative: 14 %
Lymphs Abs: 1 10*3/uL (ref 0.7–4.0)
MCH: 28.9 pg (ref 26.0–34.0)
MCHC: 30 g/dL (ref 30.0–36.0)
MCV: 96.1 fL (ref 78.0–100.0)
MONO ABS: 0.6 10*3/uL (ref 0.1–1.0)
MONOS PCT: 9 %
Neutro Abs: 5.2 10*3/uL (ref 1.7–7.7)
Neutrophils Relative %: 72 %
PLATELETS: 142 10*3/uL — AB (ref 150–400)
RBC: 3.36 MIL/uL — ABNORMAL LOW (ref 3.87–5.11)
RDW: 14.7 % (ref 11.5–15.5)
WBC: 7.1 10*3/uL (ref 4.0–10.5)

## 2017-01-02 LAB — RENAL FUNCTION PANEL
ALBUMIN: 2.8 g/dL — AB (ref 3.5–5.0)
Anion gap: 7 (ref 5–15)
BUN: 6 mg/dL (ref 6–20)
CHLORIDE: 98 mmol/L — AB (ref 101–111)
CO2: 30 mmol/L (ref 22–32)
CREATININE: 0.44 mg/dL (ref 0.44–1.00)
Calcium: 8.2 mg/dL — ABNORMAL LOW (ref 8.9–10.3)
GFR calc Af Amer: >60 mL/min (ref >60)
GLUCOSE: 106 mg/dL — AB (ref 65–99)
POTASSIUM: 3.5 mmol/L (ref 3.5–5.1)
Phosphorus: 2.5 mg/dL (ref 2.5–4.6)
Sodium: 135 mmol/L (ref 135–145)

## 2017-01-02 LAB — MAGNESIUM: MAGNESIUM: 2 mg/dL (ref 1.7–2.4)

## 2017-01-02 MED ORDER — THIAMINE HCL 100 MG/ML IJ SOLN
100.0000 mg | Freq: Every day | INTRAMUSCULAR | Status: DC
  Administered 2017-01-02 – 2017-01-03 (×2): 100 mg via INTRAVENOUS
  Filled 2017-01-02: qty 1
  Filled 2017-01-02: qty 2
  Filled 2017-01-02: qty 1

## 2017-01-02 MED ORDER — IPRATROPIUM-ALBUTEROL 0.5-2.5 (3) MG/3ML IN SOLN
3.0000 mL | Freq: Four times a day (QID) | RESPIRATORY_TRACT | Status: DC
  Administered 2017-01-02 – 2017-01-05 (×13): 3 mL via RESPIRATORY_TRACT
  Filled 2017-01-02 (×15): qty 3

## 2017-01-02 MED ORDER — RIFAXIMIN 200 MG PO TABS
200.0000 mg | ORAL_TABLET | Freq: Two times a day (BID) | ORAL | Status: DC
  Administered 2017-01-03: 200 mg via ORAL
  Filled 2017-01-02 (×3): qty 1

## 2017-01-02 NOTE — Progress Notes (Signed)
PULMONARY / CRITICAL CARE MEDICINE   Name: Anne Black MRN: [id number] DOB: 1960/10/30    ADMISSION DATE:  12/29/2016   CONSULTATION DATE:  12/30/2016  REFERRING MD:  Anne Black, M.D. / CCS  CHIEF COMPLAINT:  Sepsis  HISTORY OF PRESENT ILLNESS:  56 y.o. female with long-standing history of alcohol abuse and underlying liver cirrhosis with history of ascites and paracentesis. Patient also has known [facility] as well as bipolar disorder. Presented yesterday to hospital with 2 day history of body cramps. Also endorses generalized malaise, fatigue, decreased appetite, and nausea. Patient endorsed drinking 4-5 glasses of wine daily on average with last alcoholic beverage on 11/20. Abdominal CT imaging in the emergency department on 11/11 showed a ventral wall hernia with surrounding edema. Patient endorsed new purulent drainage from open wound and surrounding erythema from abdominal wall hernia. Patient also endorsed constant pain. Was unaware of her last bowel movement or flatus. Patient taken to the operating room today for exploratory laparotomy. Patient underwent omental resection as well as repair of umbilical hernia. Returns to the ICU with abdominal wound VAC in place and on ventilator support.  SUBJECTIVE:  Patient required Precedex infusion yesterday for alcohol withdrawl.  REVIEW OF SYSTEMS:  Unable to obtain with altered mental status.  VITAL SIGNS: BP (!) 96/57   Pulse (!) 56   Temp 98.7 F (37.1 C) (Oral)   Resp 17   Ht 5\' 2"  (1.575 m)   Wt 171 lb 15.3 oz (78 kg)   SpO2 100%   BMI 31.45 kg/m   HEMODYNAMICS:    VENTILATOR SETTINGS:    INTAKE / OUTPUT: I/O last 3 completed shifts: In: 5511.1 [P.O.:900; I.V.:3886.1; IV Piggyback:725] Out: 1890 [Urine:1790; Drains:100]  PHYSICAL EXAMINATION: Gen.: No acute distress. No family at bedside. Integument: Warm. Dry. No erythema or induration surrounding wound VAC. Abdomen: Soft.  Hypoactive bowel sounds. Wound VAC in place. Neurological: Grossly nonfocal. Patient not following commands. Pupils symmetric and reactive. Pulmonary: Normal work of breathing on nasal cannula. Diminished breath sounds in bilateral lung bases. Cardiovascular: Regular rate. No appreciated JVD.  LABS:  BMET Recent Labs  Lab 01/01/17 0223 01/01/17 1500 01/02/17 0323  NA 139 134* 135  K 2.7* 3.4* 3.5  CL 100* 97* 98*  CO2 33* 32 30  BUN <5* <5* 6  CREATININE 0.47 0.40* 0.44  GLUCOSE 105* 104* 106*    Electrolytes Recent Labs  Lab 12/31/16 0311 01/01/17 0223 01/01/17 1500 01/02/17 0323  CALCIUM 8.2* 8.0* 7.9* 8.2*  MG 1.8 1.5*  --  2.0  PHOS 2.7 3.4  --  2.5    CBC Recent Labs  Lab 12/31/16 0311 01/01/17 0223 01/02/17 0323  WBC 9.6 9.1 7.1  HGB 9.8* 9.6* 9.7*  HCT 32.1* 32.0* 32.3*  PLT 128* 131* 142*    Coag's Recent Labs  Lab 12/30/16 0415 12/31/16 0311  APTT 34  --   INR 1.30 1.20    Sepsis Markers Recent Labs  Lab 12/30/16 1240 12/30/16 1508 12/31/16 0311 01/01/17 0223  LATICACIDVEN  --  1.2  --   --   PROCALCITON <0.10  --  <0.10 <0.10    ABG Recent Labs  Lab 12/31/16 0344 12/31/16 1757 01/02/17 0827  PHART 7.395 7.261* 7.342*  PCO2ART 47.6 68.3* 56.7*  PO2ART 121* 167.0* 64.0*    Liver Enzymes Recent Labs  Lab 12/29/16 2117 12/31/16 0311 01/01/17 0223 01/02/17 0323  AST 41 24  --   --   ALT 22  13*  --   --   ALKPHOS 143* 95  --   --   BILITOT 1.5* 1.3*  --   --   ALBUMIN 4.2 3.0* 3.1* 2.8*    Cardiac Enzymes No results for input(s): TROPONINI, PROBNP in the last 168 hours.  Glucose Recent Labs  Lab 01/01/17 1132 01/01/17 1641 01/01/17 2020 01/01/17 2334 01/02/17 0442 01/02/17 0733  GLUCAP 116* 99 96 102* 105* 99    Imaging Dg Chest Port 1 View  Result Date: 01/02/2017 CLINICAL DATA:  Respiratory failure. EXAM: PORTABLE CHEST 1 VIEW COMPARISON:  12/31/2016 FINDINGS: Endotracheal and enteric tubes have been  removed. The cardiomediastinal silhouette is unchanged. There is minimal new opacity in both lung bases. No sizable pleural effusion or pneumothorax is identified. IMPRESSION: Minimal bibasilar opacity, likely atelectasis. Electronically Signed   By: Sebastian Ache M.D.   On: 01/02/2017 06:43     STUDIES:  CT ABDOMEN/PELVIS 11/13: IMPRESSION: 1. Changes of hepatic cirrhosis again noted. Mild gastric and esophageal varices seen. Nonspecific focus of contrast blush again seen at the left hepatic lobe. 2. Small volume ascites noted tracking about the liver. 3. Prominent moderate anterior abdominal wall hernia, new from 2017, with associated diffuse soft tissue edema. No evidence of bowel herniation at this time. 4. Mild scarring at the left lingula. 5.  Aortic Atherosclerosis (ICD10-I70.0). PORT CXR 11/23:  Previously reviewed by me. Enteric feeding tube coursing below diaphragm. Endotracheal tube in good position. No focal opacity appreciated.  PORT CXR 11/25:  Personally reviewed by me. Minimal patchy basilar opacity suggestive of atelectasis. No focal consolidation. Fullness to bilateral hila. No pleural effusion appreciated.  MICROBIOLOGY: MRSA PCR 11/22:  Negative  Blood Cultures x2 11/22 >>>  ANTIBIOTICS: Vancomycin 11/22 - 11/24 Zosyn 11/22 >>> (Stop 11/28)  SIGNIFICANT EVENTS: 11/21 - Presented to [facility] 11/22 - Ex Lap for incarcerated omental hernia w/ wound vac placement >> returned to ICU on vent 11/24 - BiPAP overnight for hypercarbia & altered mentation. Taken off of BiPAP in AM. Altered mentation w/ likely EtOH withdrawal >> started Precedex gtt. 11/25 - Still on Precedex with altered mentation  LINES/TUBES: OETT 11/22 -11/23 L NGT 11/22 >>> Foley >>> PIV  ASSESSMENT / PLAN:  PULMONARY A: Acute Hypoxic & Hypercarbic Respiratory Failure:  Multifactorial in etiology. Tobacco Use Disorder:  Currently smoking 1ppd.   P:   Weaning FiO2 Continuous pulse oximetry  monitoring Duonebs every 6 hours scheduled Holding Advair Continuing nicotine patch 14 mg daily BiPAP prn   CARDIOVASCULAR A:  No acute issues.  P:  Continuous monitoring on telemetry Vital signs per unit protocol Maintaining MAP >65  RENAL A:   Hypokalemia: Resolved.  P:   Continuing to monitor urine output with Foley catheter Trending electrolytes and renal function daily Holding diuretic therapy  GASTROINTESTINAL A:   Esophageal & Gastric Varices:  Noted on abdominal CT scan 11/13. Liver Cirrhosis w/ Ascites:  H/O paracentesis. Secondary to EtOH.  P:   NPO. Holding Aldactone & Lasix  HEMATOLOGIC A:   Anemia: Mild. No signs of active bleeding. Thrombocytopenia: Improving. Likely secondary to consumption.  P:  Continuing to trend cell counts daily with CBC.  INFECTIOUS A:   SIRS vs Sepsis:  Secondary to incarcerated omental hernia.  P:   Continuing empiric Zosyn Awaiting finalization of blood cultures Trending procalcitonin per algorithm   ENDOCRINE A:   Hyperglycemia:  Resolved. No h/o DM. A1C 5.0.   P:   Continuing Accu-Cheks every 4 hours with MD notification  parameters Continuing slight scale insulin per sensitive algorithm  NEUROLOGIC A:   Acute encephalopathy:  Multifactorial.  History of bipolar disorder & anxiety History of multiple sclerosis: Followed at [facility].  Possible H/O Hepatic Encephalopathy  P:   RASS goal: 0  Precedex drip Restarting Xifaxan BID asap Checking Ammonia  Switching back to Thiamine & Folic Acid IV daily Holding Risperdal, Prozac, BuSpar, & Neurontin   Prophylaxis: SCDs, Protonix IV every 12 hours, & Lovenox subcutaneous daily.. Diet: Clear liquids as tolerated.. Code Status: Full code as per previous physician discussions. Disposition: Patient to remain in ICU while requiring titration of Precedex for encephalopathy. Family Discussion: No family present during rounds this morning.   DISCUSSION:  56 y.o.  female with long-standing history of tobacco use and alcohol abuse. Has ongoing encephalopathy likely multifactorial in etiology. Suspect alcohol withdrawal. Continuing broad-spectrum metabolic therapy with Zosyn. Switching back over to IV thiamine and folic acid.   I have spent a total of 32 minutes of critical care time today caring for the patient and reviewing the patient's electronic medical record.   Donna Christen Jamison Neighbor, M.D. [facility] Pulmonary & Critical Care Pager:  [phone] After 7pm or if no response, call [phone] 01/02/2017, 10:49 AM

## 2017-01-02 NOTE — Progress Notes (Signed)
Pharmacy Antibiotic Note  Anne Black is a 56 y.o. female admitted on 12/29/2016 with sepsis and wound infection.  Pharmacy has been consulted for zosyn dosing. Pt is afebrile and WBC is WNL. SCr is WNL at 0.44 and renal function stable.  S/p ex lap with omental resection, hernia repair and placement of abdomen VAC. Vancomycin was discontinued 11/24. A 7 day course of zosyn is planned and stop dates are entered.  Plan: Continue Zosyn 3.375gm IV Q8H (4 hr inf) through 11/29 F/u renal fxn, C&S, clinical status   Height: 5\' 2"  (157.5 cm) Weight: 171 lb 15.3 oz (78 kg) IBW/kg (Calculated) : 50.1  Temp (24hrs), Avg:98.4 F (36.9 C), Min:97.6 F (36.4 C), Max:99.1 F (37.3 C)  Recent Labs  Lab 12/29/16 2117 12/30/16 0126  12/30/16 1240 12/30/16 1508 12/31/16 0311 01/01/17 0223 01/01/17 1500 01/02/17 0323  WBC 11.0* 9.0  --   --   --  9.6 9.1  --  7.1  CREATININE 0.65  --    < > 0.50  --  0.53 0.47 0.40* 0.44  LATICACIDVEN  --   --   --   --  1.2  --   --   --   --    < > = values in this interval not displayed.    Estimated Creatinine Clearance: 75.1 mL/min (by C-G formula based on SCr of 0.44 mg/dL).    No Known Allergies  Antimicrobials this admission: Vanc 11/22>>11/24 Zosyn 11/22>>(11/29)  Dose adjustments this admission: N/A  Microbiology results: 11/22 BCx: NGTD 11/22 MRSA PCR: Negative   Thank you for allowing pharmacy to be a part of this patient's care.  Della GooEmily S Sinclair, PharmD, BCPS PGY2 Infectious Diseases Pharmacy Resident Phone: Juliann ParesX [phone] 01/02/2017 11:05 AM

## 2017-01-02 NOTE — Progress Notes (Signed)
Pt not on bipap at this time but RT will monitor with RN for need.

## 2017-01-02 NOTE — Progress Notes (Signed)
3 Days Post-Op  Subjective: Developed more agitation and presumed alcohol withdrawal syndrome yesterday Ativan was not adequate  and she is now on Precedex drip  No agitation currently but somewhat somnolent Drank a few liquids yesterday.  Got out of bed. No stool ABG 7.34, PCO2 56, PO2 64. Potassium 3.5.  Hemoglobin 9.7.  WBC 7100.  Discussed care plan with Dr. Jamison NeighborNestor.  Objective: Vital signs in last 24 hours: Temp:  [97.6 F (36.4 C)-99.1 F (37.3 C)] 98.7 F (37.1 C) (11/25 0735) Pulse Rate:  [56-90] 56 (11/25 0740) Resp:  [9-25] 17 (11/25 0740) BP: (82-135)/(49-107) 96/57 (11/25 0740) SpO2:  [91 %-100 %] 100 % (11/25 0740)    Intake/Output from previous day: 11/24 0701 - 11/25 0700 In: 4311.1 [P.O.:900; I.V.:2686.1; IV Piggyback:725] Out: 865 [Urine:765; Drains:100] Intake/Output this shift: No intake/output data recorded.   General appearance: extubated.  somnolent.  Arouses somewhat.  Mostly nonverbalOn nasal cannula. Resp:Coarse breath sounds bilaterally ZO:XWRUGI:Soft. Appropriate incisional tenderness. Negative pressure dressing in place no significant guarding.  no bleeding.  Bowel sounds present.     Lab Results:  Results for orders placed or performed during the hospital encounter of 12/29/16 (from the past 24 hour(s))  Glucose, capillary     Status: Abnormal   Collection Time: 01/01/17 11:32 AM  Result Value Ref Range   Glucose-Capillary 116 (H) 65 - 99 mg/dL   Comment 1 Capillary Specimen    Comment 2 Notify RN   Basic metabolic panel     Status: Abnormal   Collection Time: 01/01/17  3:00 PM  Result Value Ref Range   Sodium 134 (L) 135 - 145 mmol/L   Potassium 3.4 (L) 3.5 - 5.1 mmol/L   Chloride 97 (L) 101 - 111 mmol/L   CO2 32 22 - 32 mmol/L   Glucose, Bld 104 (H) 65 - 99 mg/dL   BUN <5 (L) 6 - 20 mg/dL   Creatinine, Ser 0.450.40 (L) 0.44 - 1.00 mg/dL   Calcium 7.9 (L) 8.9 - 10.3 mg/dL   GFR calc non Af Amer >60 >60 mL/min   GFR calc Af Amer >60  >60 mL/min   Anion gap 5 5 - 15  Glucose, capillary     Status: None   Collection Time: 01/01/17  4:41 PM  Result Value Ref Range   Glucose-Capillary 99 65 - 99 mg/dL   Comment 1 Capillary Specimen    Comment 2 Notify RN   Glucose, capillary     Status: None   Collection Time: 01/01/17  8:20 PM  Result Value Ref Range   Glucose-Capillary 96 65 - 99 mg/dL   Comment 1 Notify RN   Glucose, capillary     Status: Abnormal   Collection Time: 01/01/17 11:34 PM  Result Value Ref Range   Glucose-Capillary 102 (H) 65 - 99 mg/dL   Comment 1 Notify RN   Magnesium     Status: None   Collection Time: 01/02/17  3:23 AM  Result Value Ref Range   Magnesium 2.0 1.7 - 2.4 mg/dL  CBC with Differential/Platelet     Status: Abnormal   Collection Time: 01/02/17  3:23 AM  Result Value Ref Range   WBC 7.1 4.0 - 10.5 K/uL   RBC 3.36 (L) 3.87 - 5.11 MIL/uL   Hemoglobin 9.7 (L) 12.0 - 15.0 g/dL   HCT 40.932.3 (L) 81.136.0 - 91.446.0 %   MCV 96.1 78.0 - 100.0 fL   MCH 28.9 26.0 - 34.0 pg   MCHC 30.0  30.0 - 36.0 g/dL   RDW 40.9 81.1 - 91.4 %   Platelets 142 (L) 150 - 400 K/uL   Neutrophils Relative % 72 %   Neutro Abs 5.2 1.7 - 7.7 K/uL   Lymphocytes Relative 14 %   Lymphs Abs 1.0 0.7 - 4.0 K/uL   Monocytes Relative 9 %   Monocytes Absolute 0.6 0.1 - 1.0 K/uL   Eosinophils Relative 5 %   Eosinophils Absolute 0.4 0.0 - 0.7 K/uL   Basophils Relative 0 %   Basophils Absolute 0.0 0.0 - 0.1 K/uL  Renal function panel     Status: Abnormal   Collection Time: 01/02/17  3:23 AM  Result Value Ref Range   Sodium 135 135 - 145 mmol/L   Potassium 3.5 3.5 - 5.1 mmol/L   Chloride 98 (L) 101 - 111 mmol/L   CO2 30 22 - 32 mmol/L   Glucose, Bld 106 (H) 65 - 99 mg/dL   BUN 6 6 - 20 mg/dL   Creatinine, Ser 7.82 0.44 - 1.00 mg/dL   Calcium 8.2 (L) 8.9 - 10.3 mg/dL   Phosphorus 2.5 2.5 - 4.6 mg/dL   Albumin 2.8 (L) 3.5 - 5.0 g/dL   GFR calc non Af Amer >60 >60 mL/min   GFR calc Af Amer >60 >60 mL/min   Anion gap 7 5 - 15   Glucose, capillary     Status: Abnormal   Collection Time: 01/02/17  4:42 AM  Result Value Ref Range   Glucose-Capillary 105 (H) 65 - 99 mg/dL   Comment 1 Notify RN   Glucose, capillary     Status: None   Collection Time: 01/02/17  7:33 AM  Result Value Ref Range   Glucose-Capillary 99 65 - 99 mg/dL   Comment 1 Capillary Specimen    Comment 2 Notify RN   I-STAT 3, arterial blood gas (G3+)     Status: Abnormal   Collection Time: 01/02/17  8:27 AM  Result Value Ref Range   pH, Arterial 7.342 (L) 7.350 - 7.450   pCO2 arterial 56.7 (H) 32.0 - 48.0 mmHg   pO2, Arterial 64.0 (L) 83.0 - 108.0 mmHg   Bicarbonate 30.9 (H) 20.0 - 28.0 mmol/L   TCO2 33 (H) 22 - 32 mmol/L   O2 Saturation 91.0 %   Acid-Base Excess 4.0 (H) 0.0 - 2.0 mmol/L   Patient temperature 97.6 F    Collection site RADIAL, ALLEN'S TEST ACCEPTABLE    Drawn by Operator    Sample type ARTERIAL      Studies/Results: Dg Chest Port 1 View  Result Date: 01/02/2017 CLINICAL DATA:  Respiratory failure. EXAM: PORTABLE CHEST 1 VIEW COMPARISON:  12/31/2016 FINDINGS: Endotracheal and enteric tubes have been removed. The cardiomediastinal silhouette is unchanged. There is minimal new opacity in both lung bases. No sizable pleural effusion or pneumothorax is identified. IMPRESSION: Minimal bibasilar opacity, likely atelectasis. Electronically Signed   By: Sebastian Ache M.D.   On: 01/02/2017 06:43    . enoxaparin (LOVENOX) injection  40 mg Subcutaneous Q24H  . folic acid  1 mg Oral Daily  . folic acid  1 mg Intravenous Daily  . insulin aspart  0-9 Units Subcutaneous Q4H  . mouth rinse  15 mL Mouth Rinse BID  . multivitamin with minerals  1 tablet Oral Daily  . nicotine  14 mg Transdermal Daily  . pantoprazole (PROTONIX) IV  40 mg Intravenous Q24H  . thiamine  100 mg Oral Daily     Assessment/Plan:  s/p Procedure(s): EXPLORATORY LAPAROTOMY WITH REPAIR ABDOMINAL WALL VENTRAL HERNIA APPLICATION OF WOUND VAC   POD  #3:Laparotomy, partial omentectomy, primary umbilical hernia repair with sutures, placement negative pressure dressing -No surgical or incisional problems POD 2. -ChangeVAC Monday Wednesday Friday. -Clear liquids  -out of bed to chair -CIWA protocol and meds ordered - Management pulmonary status per CCM. Greatly appreciate their assistance.   Hypokalemia.  This has been addressed. Cirrhosis with varices secondary to alcohol    CIWA History of multiple sclerosis Alcohol abuse  Per RN, family has begun asking whether she should be institutionalized for alcohol substance dependence.  That may be very appropriate   @PROBHOSP @  LOS: 3 days    Ernestene Mention 01/02/2017  . .prob

## 2017-01-03 DIAGNOSIS — K7031 Alcoholic cirrhosis of liver with ascites: Secondary | ICD-10-CM

## 2017-01-03 LAB — BLOOD GAS, ARTERIAL
ACID-BASE EXCESS: 3.4 mmol/L — AB (ref 0.0–2.0)
Bicarbonate: 28.9 mmol/L — ABNORMAL HIGH (ref 20.0–28.0)
DELIVERY SYSTEMS: POSITIVE
Drawn by: 36277
EXPIRATORY PAP: 8
FIO2: 40
INSPIRATORY PAP: 18
O2 Saturation: 98.5 %
PCO2 ART: 55.8 mmHg — AB (ref 32.0–48.0)
PH ART: 7.332 — AB (ref 7.350–7.450)
Patient temperature: 97.8
pO2, Arterial: 125 mmHg — ABNORMAL HIGH (ref 83.0–108.0)

## 2017-01-03 LAB — CBC WITH DIFFERENTIAL/PLATELET
BASOS ABS: 0 10*3/uL (ref 0.0–0.1)
Basophils Relative: 1 %
EOS ABS: 0.4 10*3/uL (ref 0.0–0.7)
EOS PCT: 7 %
HCT: 33.7 % — ABNORMAL LOW (ref 36.0–46.0)
HEMOGLOBIN: 9.9 g/dL — AB (ref 12.0–15.0)
LYMPHS ABS: 0.7 10*3/uL (ref 0.7–4.0)
LYMPHS PCT: 12 %
MCH: 27.8 pg (ref 26.0–34.0)
MCHC: 29.4 g/dL — ABNORMAL LOW (ref 30.0–36.0)
MCV: 94.7 fL (ref 78.0–100.0)
Monocytes Absolute: 0.6 10*3/uL (ref 0.1–1.0)
Monocytes Relative: 12 %
NEUTROS PCT: 68 %
Neutro Abs: 3.8 10*3/uL (ref 1.7–7.7)
PLATELETS: 128 10*3/uL — AB (ref 150–400)
RBC: 3.56 MIL/uL — ABNORMAL LOW (ref 3.87–5.11)
RDW: 14.2 % (ref 11.5–15.5)
WBC: 5.5 10*3/uL (ref 4.0–10.5)

## 2017-01-03 LAB — GLUCOSE, CAPILLARY
GLUCOSE-CAPILLARY: 76 mg/dL (ref 65–99)
GLUCOSE-CAPILLARY: 108 mg/dL — AB (ref 65–99)
GLUCOSE-CAPILLARY: 101 mg/dL — AB (ref 65–99)
Glucose-Capillary: 90 mg/dL (ref 65–99)
Glucose-Capillary: 121 mg/dL — ABNORMAL HIGH (ref 65–99)
Glucose-Capillary: 84 mg/dL (ref 65–99)

## 2017-01-03 LAB — COMPREHENSIVE METABOLIC PANEL
ALK PHOS: 84 U/L (ref 38–126)
ALT: 13 U/L — AB (ref 14–54)
ANION GAP: 7 (ref 5–15)
AST: 19 U/L (ref 15–41)
Albumin: 2.7 g/dL — ABNORMAL LOW (ref 3.5–5.0)
BUN: 5 mg/dL — ABNORMAL LOW (ref 6–20)
CALCIUM: 8.2 mg/dL — AB (ref 8.9–10.3)
CO2: 29 mmol/L (ref 22–32)
CREATININE: 0.5 mg/dL (ref 0.44–1.00)
Chloride: 104 mmol/L (ref 101–111)
Glucose, Bld: 96 mg/dL (ref 65–99)
Potassium: 3.3 mmol/L — ABNORMAL LOW (ref 3.5–5.1)
SODIUM: 140 mmol/L (ref 135–145)
TOTAL PROTEIN: 5.9 g/dL — AB (ref 6.5–8.1)
Total Bilirubin: 1.2 mg/dL (ref 0.3–1.2)

## 2017-01-03 LAB — PHOSPHORUS: PHOSPHORUS: 3 mg/dL (ref 2.5–4.6)

## 2017-01-03 LAB — MAGNESIUM: MAGNESIUM: 1.5 mg/dL — AB (ref 1.7–2.4)

## 2017-01-03 MED ORDER — LACTULOSE 10 GM/15ML PO SOLN
20.0000 g | Freq: Two times a day (BID) | ORAL | Status: DC
  Administered 2017-01-03 – 2017-01-08 (×3): 20 g via ORAL
  Filled 2017-01-03 (×11): qty 30

## 2017-01-03 MED ORDER — ACETAMINOPHEN 325 MG PO TABS
650.0000 mg | ORAL_TABLET | Freq: Four times a day (QID) | ORAL | Status: DC | PRN
  Administered 2017-01-03 – 2017-01-06 (×7): 650 mg via ORAL
  Filled 2017-01-03 (×7): qty 2

## 2017-01-03 MED ORDER — RIFAXIMIN 550 MG PO TABS
550.0000 mg | ORAL_TABLET | Freq: Two times a day (BID) | ORAL | Status: DC
  Administered 2017-01-03 – 2017-01-10 (×12): 550 mg via ORAL
  Filled 2017-01-03 (×18): qty 1

## 2017-01-03 MED ORDER — POTASSIUM CHLORIDE 10 MEQ/100ML IV SOLN
10.0000 meq | INTRAVENOUS | Status: AC
  Administered 2017-01-03 (×3): 10 meq via INTRAVENOUS
  Filled 2017-01-03 (×3): qty 100

## 2017-01-03 MED ORDER — RISPERIDONE 0.25 MG PO TABS
0.2500 mg | ORAL_TABLET | Freq: Three times a day (TID) | ORAL | Status: DC | PRN
  Administered 2017-01-03: 0.25 mg via ORAL
  Filled 2017-01-03 (×3): qty 1

## 2017-01-03 MED ORDER — MAGNESIUM SULFATE 2 GM/50ML IV SOLN
2.0000 g | Freq: Once | INTRAVENOUS | Status: AC
  Administered 2017-01-03: 2 g via INTRAVENOUS
  Filled 2017-01-03: qty 50

## 2017-01-03 MED ORDER — METOPROLOL TARTRATE 5 MG/5ML IV SOLN
2.5000 mg | INTRAVENOUS | Status: DC | PRN
  Filled 2017-01-03: qty 5

## 2017-01-03 MED ORDER — FLUOXETINE HCL 20 MG PO CAPS
20.0000 mg | ORAL_CAPSULE | Freq: Every day | ORAL | Status: DC
  Administered 2017-01-03 – 2017-01-11 (×8): 20 mg via ORAL
  Filled 2017-01-03 (×9): qty 1

## 2017-01-03 MED ORDER — PANTOPRAZOLE SODIUM 40 MG PO TBEC
40.0000 mg | DELAYED_RELEASE_TABLET | Freq: Every day | ORAL | Status: DC
  Administered 2017-01-03 – 2017-01-11 (×9): 40 mg via ORAL
  Filled 2017-01-03 (×9): qty 1

## 2017-01-03 NOTE — Progress Notes (Addendum)
4 Days Post-Op   Subjective/Chief Complaint: Not oriented, not sure if she has had flatus/bm, no n/v, not sure if taking po   Objective: Vital signs in last 24 hours: Temp:  [97.5 F (36.4 C)-98.4 F (36.9 C)] 97.7 F (36.5 C) (11/26 0746) Pulse Rate:  [56-69] 69 (11/26 0740) Resp:  [15-25] 23 (11/26 0900) BP: (90-113)/(48-102) 100/55 (11/26 0900) SpO2:  [89 %-100 %] 100 % (11/26 0900) FiO2 (%):  [40 %-50 %] 40 % (11/26 0351) Last BM Date: (PTA)  Intake/Output from previous day: 11/25 0701 - 11/26 0700 In: 2773.3 [I.V.:2298.3; IV Piggyback:455] Out: 645 [Urine:645] Intake/Output this shift: Total I/O In: 207.8 [I.V.:207.8] Out: 150 [Urine:150]  GI: bs present vac in place, no cellulitis  Lab Results:  Recent Labs    01/02/17 0323 01/03/17 0322  WBC 7.1 5.5  HGB 9.7* 9.9*  HCT 32.3* 33.7*  PLT 142* 128*   BMET Recent Labs    01/02/17 0323 01/03/17 0322  NA 135 140  K 3.5 3.3*  CL 98* 104  CO2 30 29  GLUCOSE 106* 96  BUN 6 5*  CREATININE 0.44 0.50  CALCIUM 8.2* 8.2*   PT/INR No results for input(s): LABPROT, INR in the last 72 hours. ABG Recent Labs    01/02/17 0827 01/03/17 0352  PHART 7.342* 7.332*  HCO3 30.9* 28.9*    Studies/Results: Dg Chest Port 1 View  Result Date: 01/02/2017 CLINICAL DATA:  Respiratory failure. EXAM: PORTABLE CHEST 1 VIEW COMPARISON:  12/31/2016 FINDINGS: Endotracheal and enteric tubes have been removed. The cardiomediastinal silhouette is unchanged. There is minimal new opacity in both lung bases. No sizable pleural effusion or pneumothorax is identified. IMPRESSION: Minimal bibasilar opacity, likely atelectasis. Electronically Signed   By: Sebastian Ache M.D.   On: 01/02/2017 06:43    Anti-infectives: Anti-infectives (From admission, onward)   Start     Dose/Rate Route Frequency Ordered Stop   01/02/17 1100  rifaximin (XIFAXAN) tablet 200 mg     200 mg Oral 2 times daily 01/02/17 1055     12/30/16 2300  vancomycin  (VANCOCIN) IVPB 750 mg/150 ml premix  Status:  Discontinued     750 mg 150 mL/hr over 60 Minutes Intravenous Every 12 hours 12/30/16 1235 01/01/17 1251   12/30/16 1300  piperacillin-tazobactam (ZOSYN) IVPB 3.375 g     3.375 g 12.5 mL/hr over 240 Minutes Intravenous Every 8 hours 12/30/16 1231 01/06/17 1259   12/30/16 1000  vancomycin (VANCOCIN) IVPB 750 mg/150 ml premix  Status:  Discontinued     750 mg 150 mL/hr over 60 Minutes Intravenous Every 12 hours 12/30/16 0125 12/30/16 1233   12/30/16 0015  vancomycin (VANCOCIN) IVPB 1000 mg/200 mL premix     1,000 mg 200 mL/hr over 60 Minutes Intravenous  Once 12/30/16 0001 12/30/16 0503      Assessment/Plan: POD #4Laparotomy, partial omentectomy, primary umbilical hernia repair with sutures, placement negative pressure dressing -ChangeVACMonday Wednesday Friday. -Clear liquids, can advance as tolerated based on mental status -out of bed to chair -CIWA protocol and meds ordered -Management pulmonary statusper CCM. Greatly appreciate their assistance. Cirrhosis with varices secondary to alcohol CIWA Cellulitis- needs five days abx for cellulitis, can be stopped tomorrow History of multiple sclerosis Alcohol abuse    [name] 01/03/2017

## 2017-01-03 NOTE — Progress Notes (Signed)
Pt refused lactulose. Pt informed of the importance of the lactulose. Pt states "I'm not taking the lactulose, I haven't taken that in a long time." I informed pt that she had taken the lactulose earlier today. Pt stated " No! I'm not taking it."

## 2017-01-03 NOTE — Progress Notes (Signed)
PULMONARY / CRITICAL CARE MEDICINE   Name: Anne ApplebaumCarla S Hinojos MRN: [id number] DOB: Jun 03, 1960    ADMISSION DATE:  12/29/2016   CONSULTATION DATE:  12/30/2016  REFERRING MD:  Emelia LoronMatthew Wakefield, M.D. / CCS  CHIEF COMPLAINT:  Sepsis  HISTORY OF PRESENT ILLNESS:  56 y.o. female with long-standing history of alcohol abuse and underlying liver cirrhosis with history of ascites and paracentesis. Patient also has [facility] as well as bipolar disorder. Presented yesterday to hospital with 2 day history of body cramps. Also endorses generalized malaise, fatigue, decreased appetite, and nausea. Patient endorsed drinking 4-5 glasses of wine daily on average with last alcoholic beverage on 11/20. Abdominal CT imaging in the emergency department on 11/11 showed a ventral wall hernia with surrounding edema. Patient endorsed new purulent drainage from open wound and surrounding erythema from abdominal wall hernia. Patient also endorsed constant pain. Was unaware of her last bowel movement or flatus. Patient taken to the operating room today for exploratory laparotomy. Patient underwent omental resection as well as repair of umbilical hernia. Returns to the ICU with abdominal wound VAC in place and on ventilator support.  SUBJECTIVE:  Atrial fibrillation last night   VITAL SIGNS: BP (!) 100/55   Pulse 69   Temp 97.7 F (36.5 C) (Axillary)   Resp (!) 23   Ht 5\' 2"  (1.575 m)   Wt 171 lb 15.3 oz (78 kg)   SpO2 100%   BMI 31.45 kg/m   HEMODYNAMICS:    VENTILATOR SETTINGS: Vent Mode: PCV;BIPAP FiO2 (%):  [40 %-50 %] 40 % Set Rate:  [15 bmp] 15 bmp PEEP:  [6 cmH20-8 cmH20] 8 cmH20  INTAKE / OUTPUT: I/O last 3 completed shifts: In: 4262.8 [I.V.:3687.8; Other:20; IV Piggyback:555] Out: 980 [Urine:980]  PHYSICAL EXAMINATION: General: This is a chronically ill-appearing 56 year old female currently sleepy but no acute distress HEENT: Normocephalic atraumatic no jugular  venous distention Pulmonary: Some scattered rhonchi that clear with cough no accessory muscle use Cardiac: Currently regular rate and rhythm without murmur rub or gallop Abdomen: Soft, nontender, wound VAC in place, hypoactive bowel sounds. Extremities/musculoskeletal: Warm dry no significant edema scattered areas of ecchymosis Neuro/psych: Awake, follows commands, oriented x1-2.  LABS:  BMET Recent Labs  Lab 01/01/17 1500 01/02/17 0323 01/03/17 0322  NA 134* 135 140  K 3.4* 3.5 3.3*  CL 97* 98* 104  CO2 32 30 29  BUN <5* 6 5*  CREATININE 0.40* 0.44 0.50  GLUCOSE 104* 106* 96    Electrolytes Recent Labs  Lab 01/01/17 0223 01/01/17 1500 01/02/17 0323 01/03/17 0322  CALCIUM 8.0* 7.9* 8.2* 8.2*  MG 1.5*  --  2.0 1.5*  PHOS 3.4  --  2.5 3.0    CBC Recent Labs  Lab 01/01/17 0223 01/02/17 0323 01/03/17 0322  WBC 9.1 7.1 5.5  HGB 9.6* 9.7* 9.9*  HCT 32.0* 32.3* 33.7*  PLT 131* 142* 128*    Coag's Recent Labs  Lab 12/30/16 0415 12/31/16 0311  APTT 34  --   INR 1.30 1.20    Sepsis Markers Recent Labs  Lab 12/30/16 1240 12/30/16 1508 12/31/16 0311 01/01/17 0223  LATICACIDVEN  --  1.2  --   --   PROCALCITON <0.10  --  <0.10 <0.10    ABG Recent Labs  Lab 12/31/16 1757 01/02/17 0827 01/03/17 0352  PHART 7.261* 7.342* 7.332*  PCO2ART 68.3* 56.7* 55.8*  PO2ART 167.0* 64.0* 125*    Liver Enzymes Recent Labs  Lab 12/29/16 2117 12/31/16  3299 01/01/17 0223 01/02/17 0323 01/03/17 0322  AST 41 24  --   --  19  ALT 22 13*  --   --  13*  ALKPHOS 143* 95  --   --  84  BILITOT 1.5* 1.3*  --   --  1.2  ALBUMIN 4.2 3.0* 3.1* 2.8* 2.7*    Cardiac Enzymes No results for input(s): TROPONINI, PROBNP in the last 168 hours.  Glucose Recent Labs  Lab 01/02/17 1119 01/02/17 1629 01/02/17 2021 01/02/17 2348 01/03/17 0435 01/03/17 0733  GLUCAP 109* 91 88 91 90 76    Imaging No results found.   STUDIES:  CT ABDOMEN/PELVIS  11/13: IMPRESSION: 1. Changes of hepatic cirrhosis again noted. Mild gastric and esophageal varices seen. Nonspecific focus of contrast blush again seen at the left hepatic lobe. 2. Small volume ascites noted tracking about the liver. 3. Prominent moderate anterior abdominal wall hernia, new from 2017, with associated diffuse soft tissue edema. No evidence of bowel herniation at this time. 4. Mild scarring at the left lingula. 5.  Aortic Atherosclerosis (ICD10-I70.0). PORT CXR 11/23:  Previously reviewed by me. Enteric feeding tube coursing below diaphragm. Endotracheal tube in good position. No focal opacity appreciated.  PORT CXR 11/25:  Personally reviewed by me. Minimal patchy basilar opacity suggestive of atelectasis. No focal consolidation. Fullness to bilateral hila. No pleural effusion appreciated.  MICROBIOLOGY: MRSA PCR 11/22:  Negative  Blood Cultures x2 11/22 >>>  ANTIBIOTICS: Vancomycin 11/22 - 11/24 Zosyn 11/22 >>> (Stop 11/28)  SIGNIFICANT EVENTS: 11/21 - Presented to [facility] 11/22 - Ex Lap for incarcerated omental hernia w/ wound vac placement >> returned to ICU on vent 11/24 - BiPAP overnight for hypercarbia & altered mentation. Taken off of BiPAP in AM. Altered mentation w/ likely EtOH withdrawal >> started Precedex gtt. 11/25 - Still on Precedex with altered mentation  LINES/TUBES: OETT 11/22 -11/23 L NGT 11/22 >>> Foley >>> PIV  ASSESSMENT / PLAN:  PULMONARY A: Acute Hypoxic & Hypercarbic Respiratory Failure:  Multifactorial in etiology. Tobacco Use Disorder:  Currently smoking 1ppd.   P:   Continue to wean oxygen Pulse oximetry Duo nebs every 6 hours Continue nicotine patch Holding Advair BiPAP as needed  CARDIOVASCULAR A:  Paroxysmal atrial fibrillation, now normal sinus rhythm  P:  Continue telemetry monitoring Ensure electrolytes replaced Not a good candidate for long-term anticoagulation KVO IVFs  RENAL A:   Hypokalemia.  P:    Replace and recheck  GASTROINTESTINAL A:   Esophageal & Gastric Varices:  Noted on abdominal CT scan 11/13. Liver Cirrhosis w/ Ascites:  H/O paracentesis. Secondary to EtOH. Status post exploratory laparotomy for incarcerated hernia  Now postop day #5 P:   Holding Aldactone and Lasix Clear liquid, advance as tolerated Wound VAC change Monday Wednesday Friday Mobilization  HEMATOLOGIC A:   Anemia: Mild. No signs of active bleeding. Thrombocytopenia: Improving. Likely secondary to consumption.  P:  Trending CBC Continuing low molecular weight heparin  INFECTIOUS A:   SIRS vs Sepsis:  Secondary to incarcerated omental hernia. -Culture data negative today -No fever spike, WBC count trending down P:   Day #5 Zosyn  ENDOCRINE A:   Hyperglycemia:  Resolved. No h/o DM. A1C 5.0.   P:   Continue CBGs Sliding scale insulin  NEUROLOGIC A:   Acute encephalopathy:  Multifactorial.  History of bipolar disorder & anxiety History of multiple sclerosis: Followed at [facility].  Possible H/O Hepatic Encephalopathy  P:   RASS goal 0 Continuing thiamine and folic  acid Discontinue Precedex Restart rifaximin mean Resume Prozac Hold BuSpar Risperdal and Neurontin for now however would resume Risperdal first depending on how she does coming off Precedex   Prophylaxis: SCDs, Protonix IV every 12 hours, & Lovenox subcutaneous daily.. Diet: Clear liquids as tolerated.. Code Status: Full code as per previous physician discussions. Disposition: Patient to remain in ICU while requiring titration of Precedex for encephalopathy. Family Discussion: No family present during rounds this morning.   DISCUSSION:  56 y.o. female with long-standing history of tobacco use and alcohol abuse. Has ongoing encephalopathy likely multifactorial in etiology. Suspect alcohol withdrawal. Continuing broad-spectrum metabolic therapy with Zosyn.  Delirium/withdrawal major barrier at this point to progressing from  a postop standpoint  My cct 35 minutes  Simonne Martinet ACNP-BC [facility] Pulmonary/Critical Care Pager # [phone] OR # [phone] if no answer

## 2017-01-03 NOTE — Progress Notes (Signed)
eLink Physician-Brief Progress Note Patient Name: Anne ApplebaumCarla S Black DOB: 1960-09-24 MRN: [id number]   Date of Service  01/03/2017  HPI/Events of Note  New onset AF  eICU Interventions  Lopressor prn Hypokalemia /hypomag -repleted      Intervention Category Intermediate Interventions: Arrhythmia - evaluation and management;Electrolyte abnormality - evaluation and management  Rakesh V. Alva 01/03/2017, 4:48 AM

## 2017-01-03 NOTE — Progress Notes (Signed)
eLink Physician-Brief Progress Note Patient Name: Anne Black DOB: [birth year] MRN: [id number]   Date of Service  01/03/2017  HPI/Events of Note  Patient transitioned off Precedex today. Requesting something for her "nerves". Known underlying anxiety. Chronic tobacco use. Nicotine patch in place. Rounding note reviewed.   eICU Interventions  Reordering a home Risperdal at decreased dose of 0.25 mg by mouth 3 times a day when necessary.      Intervention Category Intermediate Interventions: Other:  [name] 01/03/2017, 9:22 PM

## 2017-01-03 NOTE — Progress Notes (Signed)
Dr Vassie Loll notified of rhythm change. Patient changed from NSR to controlled a-fib. Orders to placed electrolytes, will continue to monitor closely.

## 2017-01-04 LAB — CULTURE, BLOOD (ROUTINE X 2)
Culture: NO GROWTH
Culture: NO GROWTH
SPECIAL REQUESTS: ADEQUATE
Special Requests: ADEQUATE

## 2017-01-04 LAB — BASIC METABOLIC PANEL
ANION GAP: 6 (ref 5–15)
Anion gap: 7 (ref 5–15)
BUN: <5 mg/dL — ABNORMAL LOW (ref 6–20)
BUN: <5 mg/dL — ABNORMAL LOW (ref 6–20)
CALCIUM: 8.2 mg/dL — AB (ref 8.9–10.3)
CHLORIDE: 101 mmol/L (ref 101–111)
CO2: 29 mmol/L (ref 22–32)
CO2: 33 mmol/L — ABNORMAL HIGH (ref 22–32)
CREATININE: 0.38 mg/dL — AB (ref 0.44–1.00)
Calcium: 7.9 mg/dL — ABNORMAL LOW (ref 8.9–10.3)
Chloride: 100 mmol/L — ABNORMAL LOW (ref 101–111)
Creatinine, Ser: 0.42 mg/dL — ABNORMAL LOW (ref 0.44–1.00)
GFR calc non Af Amer: >60 mL/min (ref >60)
Glucose, Bld: 95 mg/dL (ref 65–99)
Glucose, Bld: 130 mg/dL — ABNORMAL HIGH (ref 65–99)
POTASSIUM: 2.8 mmol/L — AB (ref 3.5–5.1)
POTASSIUM: 3 mmol/L — AB (ref 3.5–5.1)
SODIUM: 139 mmol/L (ref 135–145)
Sodium: 137 mmol/L (ref 135–145)

## 2017-01-04 LAB — GLUCOSE, CAPILLARY
GLUCOSE-CAPILLARY: 114 mg/dL — AB (ref 65–99)
GLUCOSE-CAPILLARY: 118 mg/dL — AB (ref 65–99)
GLUCOSE-CAPILLARY: 184 mg/dL — AB (ref 65–99)
Glucose-Capillary: 95 mg/dL (ref 65–99)
Glucose-Capillary: 142 mg/dL — ABNORMAL HIGH (ref 65–99)

## 2017-01-04 LAB — CBC
HEMATOCRIT: 29.7 % — AB (ref 36.0–46.0)
HEMOGLOBIN: 9 g/dL — AB (ref 12.0–15.0)
MCH: 28 pg (ref 26.0–34.0)
MCHC: 30.3 g/dL (ref 30.0–36.0)
MCV: 92.5 fL (ref 78.0–100.0)
PLATELETS: 131 10*3/uL — AB (ref 150–400)
RBC: 3.21 MIL/uL — AB (ref 3.87–5.11)
RDW: 14.4 % (ref 11.5–15.5)
WBC: 4.5 10*3/uL (ref 4.0–10.5)

## 2017-01-04 LAB — MAGNESIUM: MAGNESIUM: 2 mg/dL (ref 1.7–2.4)

## 2017-01-04 MED ORDER — POTASSIUM CHLORIDE 10 MEQ/100ML IV SOLN
10.0000 meq | INTRAVENOUS | Status: AC
  Administered 2017-01-04 (×3): 10 meq via INTRAVENOUS
  Filled 2017-01-04 (×4): qty 100

## 2017-01-04 MED ORDER — FOLIC ACID 1 MG PO TABS
1.0000 mg | ORAL_TABLET | Freq: Every day | ORAL | Status: DC
  Administered 2017-01-04 – 2017-01-08 (×5): 1 mg via ORAL
  Filled 2017-01-04 (×8): qty 1

## 2017-01-04 MED ORDER — MAGNESIUM SULFATE 2 GM/50ML IV SOLN
2.0000 g | Freq: Once | INTRAVENOUS | Status: AC
  Administered 2017-01-04: 2 g via INTRAVENOUS
  Filled 2017-01-04: qty 50

## 2017-01-04 MED ORDER — METOPROLOL TARTRATE 5 MG/5ML IV SOLN
5.0000 mg | Freq: Once | INTRAVENOUS | Status: AC
  Administered 2017-01-04: 5 mg via INTRAVENOUS

## 2017-01-04 MED ORDER — ZOLPIDEM TARTRATE 5 MG PO TABS
5.0000 mg | ORAL_TABLET | Freq: Once | ORAL | Status: AC
  Administered 2017-01-05: 5 mg via ORAL
  Filled 2017-01-04: qty 1

## 2017-01-04 MED ORDER — VITAMIN B-1 100 MG PO TABS
100.0000 mg | ORAL_TABLET | Freq: Every day | ORAL | Status: DC
  Administered 2017-01-04 – 2017-01-08 (×5): 100 mg via ORAL
  Filled 2017-01-04 (×8): qty 1

## 2017-01-04 MED ORDER — POTASSIUM CHLORIDE CRYS ER 20 MEQ PO TBCR
40.0000 meq | EXTENDED_RELEASE_TABLET | Freq: Once | ORAL | Status: AC
  Administered 2017-01-04: 40 meq via ORAL
  Filled 2017-01-04: qty 2

## 2017-01-04 MED ORDER — OXYCODONE HCL 5 MG PO TABS
5.0000 mg | ORAL_TABLET | Freq: Four times a day (QID) | ORAL | Status: DC | PRN
  Administered 2017-01-04 – 2017-01-09 (×11): 5 mg via ORAL
  Filled 2017-01-04 (×11): qty 1

## 2017-01-04 MED ORDER — POTASSIUM CHLORIDE CRYS ER 20 MEQ PO TBCR
40.0000 meq | EXTENDED_RELEASE_TABLET | ORAL | Status: AC
  Administered 2017-01-04 – 2017-01-05 (×2): 40 meq via ORAL
  Filled 2017-01-04 (×2): qty 2

## 2017-01-04 MED ORDER — GABAPENTIN 400 MG PO CAPS
400.0000 mg | ORAL_CAPSULE | Freq: Three times a day (TID) | ORAL | Status: DC
  Administered 2017-01-04 – 2017-01-11 (×21): 400 mg via ORAL
  Filled 2017-01-04 (×21): qty 1

## 2017-01-04 MED ORDER — RISPERIDONE 0.5 MG PO TABS
0.5000 mg | ORAL_TABLET | Freq: Two times a day (BID) | ORAL | Status: DC
  Administered 2017-01-04 – 2017-01-11 (×12): 0.5 mg via ORAL
  Filled 2017-01-04 (×16): qty 1

## 2017-01-04 MED ORDER — TOPIRAMATE 100 MG PO TABS
100.0000 mg | ORAL_TABLET | Freq: Every day | ORAL | Status: DC
  Administered 2017-01-04 – 2017-01-08 (×5): 100 mg via ORAL
  Filled 2017-01-04 (×8): qty 1

## 2017-01-04 NOTE — Progress Notes (Signed)
[facility] Progress Note  5 Days Post-Op   Assessment/Plan Principal Problem:   Abdominal wall cellulitis Active Problems:   Hypokalemia   Alcohol use disorder, severe, dependence (HCC)   Alcoholic cirrhosis of liver with ascites (HCC)   Ventral hernia without obstruction or gangrene   S/P exploratory laparotomy  Ventral Hernia - S/P Laparotomy, partial omentectomy, primary umbilical hernia repair with sutures, placement negative pressure dressing, 11/22, Dr. Dwain SarnaWakefield -ChangeVACM,W,F -CIWA protocol  -Management pulmonary statusper CCM. Greatly appreciate their assistance. Cirrhosis with varices secondary to alcohol abuse CIWA Cellulitis- zosyn will end on 11/29 (7 days) History of multiple sclerosis  FEN: soft diet VTE: SCD's, lovenox ID: Rifaxmin 11/25>>   Zosyn 11/22-11/29 Foley: none Follow up: Dr. Dwain SarnaWakefield  DISPO: PT pending, move to SDU, advance to soft diet    LOS: 5 days    Subjective: CC: ventral hernia repair  Pt is awake and alert. She states she is not sleeping well at night. Mild abdominal pain. No new complaints.   Objective: Vital signs in last 24 hours: Temp:  [97.7 F (36.5 C)-98.4 F (36.9 C)] 98.2 F (36.8 C) (11/27 0726) Pulse Rate:  [76] 76 (11/26 2312) Resp:  [15-23] 22 (11/27 0700) BP: (86-115)/(42-97) 112/97 (11/27 0700) SpO2:  [90 %-100 %] 98 % (11/27 0743) FiO2 (%):  [100 %] 100 % (11/27 0725) Last BM Date: 01/03/17  Intake/Output from previous day: 11/26 0701 - 11/27 0700 In: 639.6 [I.V.:389.6; IV Piggyback:250] Out: 1750 [Urine:1750] Intake/Output this shift: Total I/O In: 100 [IV Piggyback:100] Out: -   PE: Gen:  Alert, NAD, pleasant, cooperative Card:  RRR, no M/G/R heard, 2 + DP pulses bilaterally Pulm:  diminished breath sounds at b/l bases, mild expiratory wheeze, effort normal, only pulling 600 on IS Abd: Soft, not distended, vac in place, +BS, mild generalized TTP Skin: no rashes  noted, warm and dry Extremities; no edema Neuro: A&O x 3   Anti-infectives: Anti-infectives (From admission, onward)   Start     Dose/Rate Route Frequency Ordered Stop   01/03/17 1000  rifaximin (XIFAXAN) tablet 550 mg     550 mg Oral 2 times daily 01/03/17 0939     01/02/17 1100  rifaximin (XIFAXAN) tablet 200 mg  Status:  Discontinued     200 mg Oral 2 times daily 01/02/17 1055 01/03/17 0939   12/30/16 2300  vancomycin (VANCOCIN) IVPB 750 mg/150 ml premix  Status:  Discontinued     750 mg 150 mL/hr over 60 Minutes Intravenous Every 12 hours 12/30/16 1235 01/01/17 1251   12/30/16 1300  piperacillin-tazobactam (ZOSYN) IVPB 3.375 g     3.375 g 12.5 mL/hr over 240 Minutes Intravenous Every 8 hours 12/30/16 1231 01/06/17 1259   12/30/16 1000  vancomycin (VANCOCIN) IVPB 750 mg/150 ml premix  Status:  Discontinued     750 mg 150 mL/hr over 60 Minutes Intravenous Every 12 hours 12/30/16 0125 12/30/16 1233   12/30/16 0015  vancomycin (VANCOCIN) IVPB 1000 mg/200 mL premix     1,000 mg 200 mL/hr over 60 Minutes Intravenous  Once 12/30/16 0001 12/30/16 0503      Lab Results:  Recent Labs    01/03/17 0322 01/04/17 0308  WBC 5.5 4.5  HGB 9.9* 9.0*  HCT 33.7* 29.7*  PLT 128* 131*   BMET Recent Labs    01/03/17 0322 01/04/17 0308  NA 140 137  K 3.3* 2.8*  CL 104 101  CO2 29 29  GLUCOSE 96 95  BUN 5* <5*  CREATININE 0.50 0.38*  CALCIUM 8.2* 7.9*   PT/INR No results for input(s): LABPROT, INR in the last 72 hours. CMP     Component Value Date/Time   NA 137 01/04/2017 0308   K 2.8 (L) 01/04/2017 0308   CL 101 01/04/2017 0308   CO2 29 01/04/2017 0308   GLUCOSE 95 01/04/2017 0308   BUN <5 (L) 01/04/2017 0308   CREATININE 0.38 (L) 01/04/2017 0308   CREATININE 0.37 (L) 12/30/2015 1425   CALCIUM 7.9 (L) 01/04/2017 0308   PROT 5.9 (L) 01/03/2017 0322   ALBUMIN 2.7 (L) 01/03/2017 0322   AST 19 01/03/2017 0322   ALT 13 (L) 01/03/2017 0322   ALKPHOS 84 01/03/2017 0322    BILITOT 1.2 01/03/2017 0322   GFRNONAA >60 01/04/2017 0308   GFRNONAA >89 12/30/2015 1425   GFRAA >60 01/04/2017 0308   GFRAA >89 12/30/2015 1425   Lipase     Component Value Date/Time   LIPASE 35 12/29/2016 2117    Studies/Results: No results found.    Jerre Simon , [facility] Surgery 01/04/2017, 8:25 AM Pager: [phone] Consults: [phone] Mon-Fri 7:00 am-4:30 pm Sat-Sun 7:00 am-11:30 am

## 2017-01-04 NOTE — Care Management Note (Signed)
Case Management Note  Patient Details  Name: Anne Black MRN: [id number] Date of Birth: 1960/12/07  Subjective/Objective:    Pt admitted with sepsis per abdominal                 Action/Plan:  PTA from home.  Pt has longstanding substance abuse with underlying ascites and frequent paracentesis.  Pt is s/p hernia repair with wound vac in place - CM will continue to follow for discharge needs.  CSW consulted for substance abuse    Expected Discharge Date:                  Expected Discharge Plan:     In-House Referral:     Discharge planning Services  CM Consult  Post Acute Care Choice:    Choice offered to:     DME Arranged:    DME Agency:     HH Arranged:    HH Agency:     Status of Service:     If discussed at [employer] Meetings, dates discussed:    Additional Comments:  Cherylann ParrClaxton, Marriah Sanderlin S, RN 01/04/2017, 3:21 PM

## 2017-01-04 NOTE — Progress Notes (Signed)
PULMONARY / CRITICAL CARE MEDICINE   Name: Anne Black MRN: [id number] DOB: 1961-01-03    ADMISSION DATE:  12/29/2016   CONSULTATION DATE:  12/30/2016  REFERRING MD:  [name], M.D. / CCS  CHIEF COMPLAINT:  Sepsis  HISTORY OF PRESENT ILLNESS:  56 y.o. female with long-standing history of alcohol abuse and underlying liver cirrhosis with history of ascites and paracentesis. Patient also has known multiple sclerosis followed at [facility] as well as bipolar disorder. Presented yesterday to hospital with 2 day history of body cramps. Also endorses generalized malaise, fatigue, decreased appetite, and nausea. Patient endorsed drinking 4-5 glasses of wine daily on average with last alcoholic beverage on 11/20. Abdominal CT imaging in the emergency department on 11/11 showed a ventral wall hernia with surrounding edema. Patient endorsed new purulent drainage from open wound and surrounding erythema from abdominal wall hernia. Patient also endorsed constant pain. Was unaware of her last bowel movement or flatus. Patient taken to the operating room today for exploratory laparotomy. Patient underwent omental resection as well as repair of umbilical hernia. Returns to the ICU with abdominal wound VAC in place and on ventilator support.  SUBJECTIVE:  Had another episode of A. fib this morning otherwise she looks better.   VITAL SIGNS: BP (!) 113/59   Pulse 76   Temp 98.2 F (36.8 C) (Oral)   Resp (!) 27   Ht 5\' 2"  (1.575 m)   Wt 171 lb 15.3 oz (78 kg)   SpO2 100%   BMI 31.45 kg/m  Room air HEMODYNAMICS:    VENTILATOR SETTINGS: Vent Mode: Stand-by;BIPAP FiO2 (%):  [100 %] 100 %  INTAKE / OUTPUT: I/O last 3 completed shifts: In: 2383.7 [I.V.:1713.7; Other:20; IV Piggyback:650] Out: 2075 [Urine:2075]  PHYSICAL EXAMINATION: General: 56 year old female currently up in chair no acute distress HEENT normocephalic atraumatic no jugular venous distention.  Pulmonary: Clear to  auscultation with occasional rhonchi that clear liquid cough.  She has faint expiratory wheeze as well as basilar crackles. Cardiac regular rate and rhythm without murmur she does have occasional runs of atrial fibrillation on telemetry. Abdomen: Wound VAC in place, tolerating diet had 4 BMs since yesterday after initiating lactulose.  Mild right lower quadrant abdominal pain. Extremities/musculoskeletal: Scattered areas of active ecchymosis.  No significant edema.  Brisk cap refill, strong pulses. Neuro/psych: Awake, oriented, more appropriate today, moving all extremities.  Cooperative.  LABS:  BMET Recent Labs  Lab 01/02/17 0323 01/03/17 0322 01/04/17 0308  NA 135 140 137  K 3.5 3.3* 2.8*  CL 98* 104 101  CO2 30 29 29   BUN 6 5* <5*  CREATININE 0.44 0.50 0.38*  GLUCOSE 106* 96 95    Electrolytes Recent Labs  Lab 01/01/17 0223  01/02/17 0323 01/03/17 0322 01/04/17 0308  CALCIUM 8.0*   < > 8.2* 8.2* 7.9*  MG 1.5*  --  2.0 1.5*  --   PHOS 3.4  --  2.5 3.0  --    < > = values in this interval not displayed.    CBC Recent Labs  Lab 01/02/17 0323 01/03/17 0322 01/04/17 0308  WBC 7.1 5.5 4.5  HGB 9.7* 9.9* 9.0*  HCT 32.3* 33.7* 29.7*  PLT 142* 128* 131*    Coag's Recent Labs  Lab 12/30/16 0415 12/31/16 0311  APTT 34  --   INR 1.30 1.20    Sepsis Markers Recent Labs  Lab 12/30/16 1240 12/30/16 1508 12/31/16 0311 01/01/17 0223  LATICACIDVEN  --  1.2  --   --  PROCALCITON <0.10  --  <0.10 <0.10    ABG Recent Labs  Lab 12/31/16 1757 01/02/17 0827 01/03/17 0352  PHART 7.261* 7.342* 7.332*  PCO2ART 68.3* 56.7* 55.8*  PO2ART 167.0* 64.0* 125*    Liver Enzymes Recent Labs  Lab 12/29/16 2117 12/31/16 0311 01/01/17 0223 01/02/17 0323 01/03/17 0322  AST 41 24  --   --  19  ALT 22 13*  --   --  13*  ALKPHOS 143* 95  --   --  84  BILITOT 1.5* 1.3*  --   --  1.2  ALBUMIN 4.2 3.0* 3.1* 2.8* 2.7*    Cardiac Enzymes No results for input(s):  TROPONINI, PROBNP in the last 168 hours.  Glucose Recent Labs  Lab 01/03/17 0733 01/03/17 1126 01/03/17 1458 01/03/17 1926 01/03/17 2300 01/04/17 0724  GLUCAP 76 108* 101* 121* 84 95    Imaging No results found.   STUDIES:  CT ABDOMEN/PELVIS 11/13: IMPRESSION: 1. Changes of hepatic cirrhosis again noted. Mild gastric and esophageal varices seen. Nonspecific focus of contrast blush again seen at the left hepatic lobe. 2. Small volume ascites noted tracking about the liver. 3. Prominent moderate anterior abdominal wall hernia, new from 2017, with associated diffuse soft tissue edema. No evidence of bowel herniation at this time. 4. Mild scarring at the left lingula. 5.  Aortic Atherosclerosis (ICD10-I70.0). PORT CXR 11/23:  Previously reviewed by me. Enteric feeding tube coursing below diaphragm. Endotracheal tube in good position. No focal opacity appreciated.  PORT CXR 11/25:  Personally reviewed by me. Minimal patchy basilar opacity suggestive of atelectasis. No focal consolidation. Fullness to bilateral hila. No pleural effusion appreciated.  MICROBIOLOGY: MRSA PCR 11/22:  Negative  Blood Cultures x2 11/22 >>>  ANTIBIOTICS: Vancomycin 11/22 - 11/24 Zosyn 11/22 >>> (Stop 11/28)  SIGNIFICANT EVENTS: 11/21 - Presented to [facility] 11/22 - Ex Lap for incarcerated omental hernia w/ wound vac placement >> returned to ICU on vent 11/24 - BiPAP overnight for hypercarbia & altered mentation. Taken off of BiPAP in AM. Altered mentation w/ likely EtOH withdrawal >> started Precedex gtt. 11/25 - Still on Precedex with altered mentation  LINES/TUBES: OETT 11/22 -11/23 L NGT 11/22 >>> Foley >>> PIV  ASSESSMENT / PLAN:  PULMONARY A: Acute Hypoxic & Hypercarbic Respiratory Failure:  Multifactorial in etiology. Tobacco Use Disorder:  Currently smoking 1ppd.   P:   Continue DuoNeb every 6 hours Continue nicotine patch Walking oximetry Holding Advair Encourage incentive  spirometry and activity  CARDIOVASCULAR A:  Paroxysmal atrial fibrillation, now normal sinus rhythm; he had another brief episode this a.m. responded to beta blockade.  Her potassium is quite low suspect this was electrolyte mediated  P:  Continue telemetry Aggressively replace electrolytes repeat afternoon chemistry to ensure success As needed Lopressor   RENAL A:   Hypokalemia. Mild hypomagnesemia P:   Replace and recheck Holding Aldactone and Lasix for now will likely need to resume this next 24 hours but given electrolyte abnormality will hold off  GASTROINTESTINAL A:   Esophageal & Gastric Varices:  Noted on abdominal CT scan 11/13. Liver Cirrhosis w/ Ascites:  H/O paracentesis. Secondary to EtOH. Status post exploratory laparotomy for incarcerated hernia  Now postop day #6 P:   Advance diet Wound VAC per surgery with next change scheduled for Wednesday then Friday Incentive spirometry and mobilization Continuing rifaximin and lactulose  HEMATOLOGIC A:   Anemia: Mild. No signs of active bleeding. Thrombocytopenia: Improving. Likely secondary to consumption.  P:  Trending CBC Continue  low molecular weight heparin  INFECTIOUS A:   SIRS vs Sepsis:  Secondary to incarcerated omental hernia. -Culture data negative today -No fever spike, WBC count trending down P:   Completing Zosyn today 11/27  ENDOCRINE A:   Hyperglycemia:  Resolved. No h/o DM. A1C 5.0.   P:   Continue CBGs and sliding scale insulin  NEUROLOGIC A:   Acute encephalopathy:  Multifactorial.  History of bipolar disorder & anxiety History of [facility]: [facility]  Possible H/O Hepatic Encephalopathy This has improved dramatically P:   Thiamine and folic acid Rifaximin and lactulose as mentioned above Continue Prozac Add back BuSpar, Risperdal and Neurontin  Prophylaxis: SCDs, Protonix IV every 12 hours, & Lovenox subcutaneous daily.. Diet: Clear liquids as  tolerated.. Code Status: Full code as per previous physician discussions. Disposition: Patient to remain in ICU while requiring titration of Precedex for encephalopathy. Family Discussion: No family present during rounds this morning.   DISCUSSION:  56 y.o. female with long-standing history of tobacco use and alcohol abuse. Has ongoing encephalopathy likely multifactorial in etiology.  Delirium/withdrawal major barrier at this point to progressing from a postop standpoint.  This is improved significantly, mental status much better with only some short-term memory deficits.  She is ready for transfer out of the intensive care to the stepdown unit focus at this point remains on rehabilitation, routine wound care, and advancing diet.  We will ask internal medicine service to pick up care.  My cct 35 minutes  Simonne MartinetPeter E Babcock ACNP-BC [facility] Pulmonary/Critical Care Pager # [phone] OR # [phone] if no answer

## 2017-01-04 NOTE — Progress Notes (Signed)
eLink Physician-Brief Progress Note Patient Name: Anne Black DOB: Jan 22, 1961 MRN: [id number]   Date of Service  01/04/2017  HPI/Events of Note  insomnia  eICU Interventions  Ambien 5 mg po times one     Intervention Category Minor Interventions: Routine modifications to care plan (e.g. PRN medications for pain, fever)  Laquiesha Piacente 01/04/2017, 11:37 PM

## 2017-01-04 NOTE — Progress Notes (Signed)
eLink Physician-Brief Progress Note Patient Name: Anne ApplebaumCarla S Black DOB: [birth year] MRN: [id number]   Date of Service  01/04/2017  HPI/Events of Note  Hypokalemia  eICU Interventions  Potassium replaced     Intervention Category Intermediate Interventions: Electrolyte abnormality - evaluation and management  DETERDING,ELIZABETH 01/04/2017, 7:58 PM

## 2017-01-04 NOTE — Progress Notes (Signed)
eLink Physician-Brief Progress Note Patient Name: Anne Black DOB: Jun 19, 1960 MRN: [id number]   Date of Service  01/04/2017  HPI/Events of Note  Serum potassium 2.8. Has peripheral IV access. Taking orals.   eICU Interventions  1. KCl 40 mEq by mouth ordered 2. KCl 10 mEq IV 4 runs      Intervention Category Intermediate Interventions: Electrolyte abnormality - evaluation and management  [name] 01/04/2017, 4:45 AM

## 2017-01-04 NOTE — Clinical Social Work Note (Addendum)
Clinical Social Work Assessment  Patient Details  Name: Anne Black MRN: [id number] Date of Birth: 1960-05-20  Date of referral:  01/04/17               Reason for consult:  Other (Comment Required)(mother wanting to speak with CSW. )                Permission sought to share information with:  Case Manager Permission granted to share information::  Yes, Verbal Permission Granted  Name::       Agency::     Relationship::     Contact Information:     Housing/Transportation Living arrangements for the past 2 months:  Single Family Home(alone.) Source of Information:  Parent(pt's mother ) Patient Interpreter Needed:  None Criminal Activity/Legal Involvement Pertinent to Current Situation/Hospitalization:  No - Comment as needed Significant Relationships:  Adult Children, Parents, Dependent Children, Other Family Members Lives with:  Self Do you feel safe going back to the place where you live?  Yes Need for family participation in patient care:  Yes (Comment)  Care giving concerns:  CSW spoke with pt's mother via phone. At this time pt's mother is concerned with care after pt has been discharged from the hospital.    Social Worker assessment / plan:  CSW spoke with pt's mother via phone. During this time CSW was made aware that pt is form home alone. Pt has support from children, mother, as well as pt's ex husband. Pt's mother reports that she is wanting help for pt, but knows that pt will sign herself out if placed within a facility at the time of discharge. CSW explained to pt's mother that if pt is POA of self and chooses not to be at a facility that is pt's choice and right. CSW explained to pt's mother that at this time we would have to see what PT recommends for pt and follow up from there.   Employment status:  Other (Comment) Insurance information:  Medicare(part A. ) PT Recommendations:  Not assessed at this time Information / Referral to community resources:      Patient/Family's Response to care:  Pt's mother appeared to be understanding and agreeable to plan of care at this time.   Patient/Family's Understanding of and Emotional Response to Diagnosis, Current Treatment, and Prognosis:  No further questions or concerns have been presented to CSW at this time.   Emotional Assessment Appearance:    Attitude/Demeanor/Rapport:    Affect (typically observed):    Orientation:    Alcohol / Substance use:  Not Applicable Psych involvement (Current and /or in the community):  No (Comment)(not at this time. )  Discharge Needs  Concerns to be addressed:  Care Coordination Readmission within the last 30 days:  Yes Current discharge risk:  Abuse, Lives alone Barriers to Discharge:  Active Substance Use   Robb Matar, LCSWA 01/04/2017, 2:39 PM

## 2017-01-05 LAB — BASIC METABOLIC PANEL
ANION GAP: 5 (ref 5–15)
CALCIUM: 8.4 mg/dL — AB (ref 8.9–10.3)
CO2: 28 mmol/L (ref 22–32)
Chloride: 105 mmol/L (ref 101–111)
Creatinine, Ser: 0.39 mg/dL — ABNORMAL LOW (ref 0.44–1.00)
GFR calc Af Amer: >60 mL/min (ref >60)
GFR calc non Af Amer: >60 mL/min (ref >60)
GLUCOSE: 102 mg/dL — AB (ref 65–99)
POTASSIUM: 3.6 mmol/L (ref 3.5–5.1)
Sodium: 138 mmol/L (ref 135–145)

## 2017-01-05 LAB — GLUCOSE, CAPILLARY
GLUCOSE-CAPILLARY: 93 mg/dL (ref 65–99)
GLUCOSE-CAPILLARY: 106 mg/dL — AB (ref 65–99)
GLUCOSE-CAPILLARY: 131 mg/dL — AB (ref 65–99)
Glucose-Capillary: 100 mg/dL — ABNORMAL HIGH (ref 65–99)
Glucose-Capillary: 111 mg/dL — ABNORMAL HIGH (ref 65–99)

## 2017-01-05 LAB — MAGNESIUM: MAGNESIUM: 1.8 mg/dL (ref 1.7–2.4)

## 2017-01-05 MED ORDER — FUROSEMIDE 40 MG PO TABS
40.0000 mg | ORAL_TABLET | Freq: Every day | ORAL | Status: DC
  Administered 2017-01-05 – 2017-01-11 (×6): 40 mg via ORAL
  Filled 2017-01-05 (×7): qty 1

## 2017-01-05 MED ORDER — SPIRONOLACTONE 100 MG PO TABS
100.0000 mg | ORAL_TABLET | Freq: Every day | ORAL | Status: DC
  Administered 2017-01-05 – 2017-01-11 (×7): 100 mg via ORAL
  Filled 2017-01-05 (×5): qty 1
  Filled 2017-01-05: qty 4
  Filled 2017-01-05: qty 1

## 2017-01-05 MED ORDER — MODAFINIL 100 MG PO TABS
200.0000 mg | ORAL_TABLET | Freq: Every day | ORAL | Status: DC
  Administered 2017-01-05 – 2017-01-08 (×4): 200 mg via ORAL
  Filled 2017-01-05 (×4): qty 2

## 2017-01-05 MED ORDER — POTASSIUM CHLORIDE CRYS ER 20 MEQ PO TBCR
40.0000 meq | EXTENDED_RELEASE_TABLET | Freq: Once | ORAL | Status: AC
  Administered 2017-01-05: 40 meq via ORAL
  Filled 2017-01-05: qty 2

## 2017-01-05 MED ORDER — RISPERIDONE 0.5 MG PO TABS: 0.5000 mg | ORAL_TABLET | Freq: Two times a day (BID) | ORAL | Status: DC | PRN

## 2017-01-05 MED ORDER — IPRATROPIUM-ALBUTEROL 0.5-2.5 (3) MG/3ML IN SOLN
3.0000 mL | Freq: Three times a day (TID) | RESPIRATORY_TRACT | Status: DC
  Administered 2017-01-06 (×2): 3 mL via RESPIRATORY_TRACT
  Filled 2017-01-05 (×2): qty 3

## 2017-01-05 NOTE — Evaluation (Signed)
Physical Therapy Evaluation Patient Details Name: Anne ApplebaumCarla S Monk MRN: [id number] DOB: 20-Feb-1960 Today's Date: 01/05/2017   History of Present Illness  Pt is a 56 y.o. female with PMH significant of alcohol abuse, cirrhosis, hypokalemia, bipolar disorder, and [facility]; who presents on 12/29/16 with c/o having cramps all over body for last 2 days. Now s/p repair of abdominal wall ventral hernia with wound vac placement on 11/22.      Clinical Impression  Pt presents with increased confusion and an overall decrease in functional mobility secondary to above. Pt is from home alone and does not have family available for 24/7 support; pt poor historian, and unclear of baseline PLOF.Today, required max encouragement for participation with PT. MinA to take steps from bed to chair with bilat HHA; pt refusing further mobility secondary to c/o abdominal pain (RN aware). Pt would benefit from continued acute PT services to maximize functional mobility and independence prior to d/c with SNF-level therapies.     Follow Up Recommendations SNF;Supervision/Assistance - 24 hour    Equipment Recommendations  Other (comment)(TBD)    Recommendations for Other Services OT consult     Precautions / Restrictions Precautions Precautions: Fall Precaution Comments: Abdominal wound vac Restrictions Weight Bearing Restrictions: No      Mobility  Bed Mobility Overal bed mobility: Needs Assistance Bed Mobility: Supine to Sit     Supine to sit: Min guard;HOB elevated     General bed mobility comments: Max encouragement required to participate in OOB mobility; increased time and effort secondary to abdominal guarding  Transfers Overall transfer level: Needs assistance Equipment used: 2 person hand held assist Transfers: Sit to/from Stand Sit to Stand: Min assist         General transfer comment: MinA to assist trunk elevation and help pt maintain balance with bilat  HHA  Ambulation/Gait Ambulation/Gait assistance: Min assist Ambulation Distance (Feet): 2 Feet Assistive device: 2 person hand held assist Gait Pattern/deviations: Step-to pattern;Wide base of support;Antalgic Gait velocity: Decreased Gait velocity interpretation: <1.8 ft/sec, indicative of risk for recurrent falls General Gait Details: Pt able to take steps from bed to chair with minA and bilat HHA for balance; declining further mobility secondary to c/o pain  Stairs            Wheelchair Mobility    Modified Rankin (Stroke Patients Only)       Balance Overall balance assessment: Needs assistance   Sitting balance-Leahy Scale: Fair       Standing balance-Leahy Scale: Poor                               Pertinent Vitals/Pain Pain Assessment: Faces Faces Pain Scale: Hurts even more Pain Location: Abdomen Pain Descriptors / Indicators: Operative site guarding;Grimacing Pain Intervention(s): Limited activity within patient's tolerance;Monitored during session;Repositioned    Home Living Family/patient expects to be discharged to:: Private residence Living Arrangements: Alone Available Help at Discharge: Family;Available PRN/intermittently Type of Home: House Home Access: Stairs to enter   Entrance Stairs-Number of Steps: 2 Home Layout: Able to live on main level with bedroom/bathroom;Multi-level Home Equipment: Walker - 2 wheels;Cane - single point Additional Comments: Lives alone and does not have family who can stay to assist    Prior Function           Comments: Pt inconsistent with report regarding PLOF and amount of assist needed. States she used to have aide over summer assist with  cooking and cleaning, but she does not come anymore     Hand Dominance        Extremity/Trunk Assessment   Upper Extremity Assessment Upper Extremity Assessment: Generalized weakness    Lower Extremity Assessment Lower Extremity Assessment:  Generalized weakness       Communication   Communication: No difficulties  Cognition Arousal/Alertness: Awake/alert Behavior During Therapy: WFL for tasks assessed/performed Overall Cognitive Status: No family/caregiver present to determine baseline cognitive functioning                                 General Comments: Pt A&Ox4. Intermittent confusion when discussing PLOF and available support. Slowed processing, and potential increased distraction due to abdominal pain      General Comments General comments (skin integrity, edema, etc.): SpO2 remained >90% on RA    Exercises     Assessment/Plan    PT Assessment Patient needs continued PT services  PT Problem List Decreased strength;Decreased balance;Decreased range of motion;Decreased activity tolerance;Decreased mobility;Decreased cognition;Decreased knowledge of use of DME;Decreased safety awareness;Pain       PT Treatment Interventions DME instruction;Gait training;Stair training;Functional mobility training;Therapeutic activities;Therapeutic exercise;Balance training;Patient/family education    PT Goals (Current goals can be found in the Care Plan section)  Acute Rehab PT Goals Patient Stated Goal: Decreased pain PT Goal Formulation: With patient Time For Goal Achievement: 01/19/17 Potential to Achieve Goals: Good    Frequency Min 2X/week   Barriers to discharge Decreased caregiver support      Co-evaluation               AM-PAC PT "6 Clicks" Daily Activity  Outcome Measure Difficulty turning over in bed (including adjusting bedclothes, sheets and blankets)?: A Little Difficulty moving from lying on back to sitting on the side of the bed? : A Little Difficulty sitting down on and standing up from a chair with arms (e.g., wheelchair, bedside commode, etc,.)?: Unable Help needed moving to and from a bed to chair (including a wheelchair)?: A Little Help needed walking in hospital room?: A  Lot Help needed climbing 3-5 steps with a railing? : A Lot 6 Click Score: 14    End of Session Equipment Utilized During Treatment: Gait belt Activity Tolerance: Patient limited by pain Patient left: with call bell/phone within reach;in chair;with chair alarm set;with nursing/sitter in room Nurse Communication: Mobility status PT Visit Diagnosis: Other abnormalities of gait and mobility (R26.89);Muscle weakness (generalized) (M62.81)    Time: 1610-9604 PT Time Calculation (min) (ACUTE ONLY): 27 min   Charges:   PT Evaluation $PT Eval Moderate Complexity: 1 Mod PT Treatments $Therapeutic Activity: 8-22 mins   PT G Codes:       Ina Homes, PT, DPT Acute Rehab Services  Pager: [phone]  Malachy Chamber 01/05/2017, 11:30 AM

## 2017-01-05 NOTE — Clinical Social Work Placement (Signed)
   CLINICAL SOCIAL WORK PLACEMENT  NOTE  Date:  01/05/2017  Patient Details  Name: KIMESHA BIRCHETT MRN: [id number] Date of Birth: 04/24/1960  Clinical Social Work is seeking post-discharge placement for this patient at the Skilled  Nursing Facility level of care (*CSW will initial, date and re-position this form in  chart as items are completed):  Yes   Patient/family provided with [place] Clinical Social Work Department's list of facilities offering this level of care within the geographic area requested by the patient (or if unable, by the patient's family).  Yes   Patient/family informed of their freedom to choose among providers that offer the needed level of care, that participate in Medicare, Medicaid or managed care program needed by the patient, have an available bed and are willing to accept the patient.  Yes   Patient/family informed of [facility]'s ownership interest in [facility] and [facility], as well as of the fact that they are under no obligation to receive care at these facilities.  PASRR submitted to EDS on 01/05/17     PASRR number received on       Existing PASRR number confirmed on       FL2 transmitted to all facilities in geographic area requested by pt/family on 01/05/17     FL2 transmitted to all facilities within larger geographic area on       Patient informed that his/her managed care company has contracts with or will negotiate with certain facilities, including the following:            Patient/family informed of bed offers received.  Patient chooses bed at       Physician recommends and patient chooses bed at      Patient to be transferred to   on  .  Patient to be transferred to facility by       Patient family notified on   of transfer.  Name of family member notified:        PHYSICIAN Please sign FL2     Additional Comment:    _______________________________________________ Margarito Liner, LCSW 01/05/2017, 1:56  PM

## 2017-01-05 NOTE — Progress Notes (Signed)
PROGRESS NOTE  KMYA BURGHARDT  OYD:[id number] DOB: Jul 06, 1960 DOA: 12/29/2016 PCP: Patient, No Pcp Per  Brief Narrative: 56 y.o. female with long-standing history of alcohol abuse and underlying liver cirrhosis with history of ascites and paracentesis. Patient also has known multiple sclerosis followed at [facility] as well as bipolar disorder. Presented yesterday to hospital with 2 day history of body cramps. Also endorses generalized malaise, fatigue, decreased appetite, and nausea. Patient endorsed drinking 4-5 glasses of wine daily on average with last alcoholic beverage on 11/20. Abdominal CT imaging in the emergency department on 11/21 showed a ventral wall hernia with surrounding edema. Patient endorsed new purulent drainage from open wound and surrounding erythema from abdominal wall hernia. On 11/22, she was taken to the OR for laparotomy, partial omentectomy, primary umbilical hernia repair with sutures and negative pressure dressing. CCM assumed care. The patient was extubated 11/24 and required precedex infusion for alcohol withdrawal which was weaned.  Assessment & Plan: Principal Problem:   Abdominal wall cellulitis Active Problems:   Hypokalemia   Alcohol use disorder, severe, dependence (HCC)   Alcoholic cirrhosis of liver with ascites (HCC)   Ventral hernia without obstruction or gangrene   S/P exploratory laparotomy  Sepsis due to ventral hernia with incarceration s/p laparotomy with hernia repair:  - Wound care MWF w/vac per surgery - Optimize nutrition, regular diet per surgery - Completed zosyn  Hepatic cirrhosis with ascites and varices:  - Continue rifaximin and lactulose, not currently encephalopathic - Will restart lasix, spironolactone and monitor electrolytes closely  Acute encephalopathy: Related to alcohol withdrawal, acute illness/delirium, and hepatic encephalopathy.  - Resolved.   Paroxysmal atrial fibrillation: Has converted to NSR, responsive to beta blocker,  suspect she' susceptible to hypokalemia/hypomagnesemia.  - continue metoprolol prn - Monitor electrolytes, maintain K > 4 and Mg > 2. - Continue heart monitoring while inpatient.  - Recommend cardiology follow up. Consider starting aspirin (CHA2DS2-VASc is 1)  Acute hypoxic and hypercarbic respiratory failure: Multifactorial in setting of acute illness, tobacco use disorder contributing.  - Wean oxygen as able - Continue duonebs, IS, OOB  Multiple sclerosis:  - Follow up at [facility]  Bipolar disorder:  - Restart home medications prozac, buspar, risperidone, neurontin, topiramate.   Anemia: Mild. No signs of active bleeding. - Monitor  Thrombocytopenia: Improving. Likely secondary to consumption. - Monitor, continue DVT ppx  DVT prophylaxis: Lovenox Code Status: Full Family Communication: Ex-husband at bedside per pt's wishes this AM Disposition Plan: Transfer to floor today, to SNF in next 1 - 2 days based on PT eval.  Consultants:   CCM  Surgery  Procedures:  11/22 - Ex Lap for incarcerated omental hernia w/ wound vac placement >> returned to ICU on vent 11/24 - BiPAP overnight for hypercarbia & altered mentation. Taken off of BiPAP in AM. Altered mentation w/ likely EtOH withdrawal >> started Precedex gtt. 11/25 - Still on Precedex with altered mentation  Antimicrobials:  Vancomycin 11/22 - 11/24  Zosyn 11/22 - 11/28   Subjective: Breathing better, still feeling weak but pain is controlled. No fevers.   Objective: Vitals:   01/05/17 0700 01/05/17 0800 01/05/17 0931 01/05/17 1116  BP: 106/68 106/68  113/64  Pulse: 86 83  78  Resp: (!) 22 (!) 25  (!) 25  Temp: 99 F (37.2 C) 97.7 F (36.5 C)  97.9 F (36.6 C)  TempSrc: Oral Oral  Oral  SpO2: 91% 91% 95% 95%  Weight:      Height:  Intake/Output Summary (Last 24 hours) at 01/05/2017 1155 Last data filed at 01/05/2017 0921 Gross per 24 hour  Intake 1100 ml  Output 1051 ml  Net 49 ml   Filed  Weights   12/30/16 1200 12/31/16 0603 01/05/17 0154  Weight: 67.9 kg (149 lb 11.1 oz) 78 kg (171 lb 15.3 oz) 80.6 kg (177 lb 11.1 oz)    Gen: 56 y.o. female in no distress, chronically ill-appearing Pulm: Non-labored breathing. Basilar crackles mild CV: Regular rate and rhythm. No murmur, rub, or gallop. No JVD, no pedal edema. GI: Abdomen soft, appropriately tender with midline incisional wound with vac in place. No rebound or guarding. Able to sit from laying down unassisted. Ext: Warm, no deformities Skin: No rashes, lesions no ulcers Neuro: Alert and oriented. No focal neurological deficits. Psych: Judgement and insight appear fair. Mood & affect appropriate.   Data Reviewed: I have personally reviewed following labs and imaging studies  CBC: Recent Labs  Lab 12/31/16 0311 01/01/17 0223 01/02/17 0323 01/03/17 0322 01/04/17 0308  WBC 9.6 9.1 7.1 5.5 4.5  NEUTROABS 7.6 7.3 5.2 3.8  --   HGB 9.8* 9.6* 9.7* 9.9* 9.0*  HCT 32.1* 32.0* 32.3* 33.7* 29.7*  MCV 92.8 95.2 96.1 94.7 92.5  PLT 128* 131* 142* 128* 131*   Basic Metabolic Panel: Recent Labs  Lab 12/31/16 0311 01/01/17 0223  01/02/17 0323 01/03/17 0322 01/04/17 0308 01/04/17 1504 01/05/17 0649  NA 136 139   < > 135 140 137 139 138  K 3.9 2.7*   < > 3.5 3.3* 2.8* 3.0* 3.6  CL 101 100*   < > 98* 104 101 100* 105  CO2 30 33*   < > 30 29 29  33* 28  GLUCOSE 105* 105*   < > 106* 96 95 130* 102*  BUN <5* <5*   < > 6 5* <5* <5* <5*  CREATININE 0.53 0.47   < > 0.44 0.50 0.38* 0.42* 0.39*  CALCIUM 8.2* 8.0*   < > 8.2* 8.2* 7.9* 8.2* 8.4*  MG 1.8 1.5*  --  2.0 1.5*  --  2.0 1.8  PHOS 2.7 3.4  --  2.5 3.0  --   --   --    < > = values in this interval not displayed.   GFR: Estimated Creatinine Clearance: 76.3 mL/min (A) (by C-G formula based on SCr of 0.39 mg/dL (L)). Liver Function Tests: Recent Labs  Lab 12/29/16 2117 12/31/16 0311 01/01/17 0223 01/02/17 0323 01/03/17 0322  AST 41 24  --   --  19  ALT 22 13*   --   --  13*  ALKPHOS 143* 95  --   --  84  BILITOT 1.5* 1.3*  --   --  1.2  PROT 8.3* 5.8*  --   --  5.9*  ALBUMIN 4.2 3.0* 3.1* 2.8* 2.7*   Recent Labs  Lab 12/29/16 2117  LIPASE 35   Recent Labs  Lab 01/02/17 1126  AMMONIA 68*   Coagulation Profile: Recent Labs  Lab 12/30/16 0415 12/31/16 0311  INR 1.30 1.20   Cardiac Enzymes: No results for input(s): CKTOTAL, CKMB, CKMBINDEX, TROPONINI in the last 168 hours. BNP (last 3 results) No results for input(s): PROBNP in the last 8760 hours. HbA1C: No results for input(s): HGBA1C in the last 72 hours. CBG: Recent Labs  Lab 01/04/17 1928 01/04/17 2333 01/05/17 0409 01/05/17 0747 01/05/17 1141  GLUCAP 184* 142* 100* 93 111*   Lipid Profile: No results for  input(s): CHOL, HDL, LDLCALC, TRIG, CHOLHDL, LDLDIRECT in the last 72 hours. Thyroid Function Tests: No results for input(s): TSH, T4TOTAL, FREET4, T3FREE, THYROIDAB in the last 72 hours. Anemia Panel: No results for input(s): VITAMINB12, FOLATE, FERRITIN, TIBC, IRON, RETICCTPCT in the last 72 hours. Urine analysis:    Component Value Date/Time   COLORURINE STRAW (A) 12/29/2016 2130   APPEARANCEUR CLEAR 12/29/2016 2130   LABSPEC 1.005 12/29/2016 2130   PHURINE 7.0 12/29/2016 2130   GLUCOSEU NEGATIVE 12/29/2016 2130   HGBUR NEGATIVE 12/29/2016 2130   BILIRUBINUR NEGATIVE 12/29/2016 2130   BILIRUBINUR moderate (A) 06/19/2015 1906   KETONESUR NEGATIVE 12/29/2016 2130   PROTEINUR NEGATIVE 12/29/2016 2130   UROBILINOGEN >=8.0 06/19/2015 1906   UROBILINOGEN 0.2 09/14/2011 2141   NITRITE NEGATIVE 12/29/2016 2130   LEUKOCYTESUR NEGATIVE 12/29/2016 2130   Recent Results (from the past 240 hour(s))  Culture, blood (routine x 2)     Status: None   Collection Time: 12/30/16  1:30 AM  Result Value Ref Range Status   Specimen Description BLOOD RIGHT HAND  Final   Special Requests   Final    BOTTLES DRAWN AEROBIC AND ANAEROBIC Blood Culture adequate volume   Culture  NO GROWTH 5 DAYS  Final   Report Status 01/04/2017 FINAL  Final  Culture, blood (routine x 2)     Status: None   Collection Time: 12/30/16  2:45 AM  Result Value Ref Range Status   Specimen Description BLOOD RIGHT FOREARM  Final   Special Requests   Final    BOTTLES DRAWN AEROBIC AND ANAEROBIC Blood Culture adequate volume   Culture NO GROWTH 5 DAYS  Final   Report Status 01/04/2017 FINAL  Final  MRSA PCR Screening     Status: None   Collection Time: 12/30/16 12:27 PM  Result Value Ref Range Status   MRSA by PCR NEGATIVE NEGATIVE Final    Comment:        The GeneXpert MRSA Assay (FDA approved for NASAL specimens only), is one component of a comprehensive MRSA colonization surveillance program. It is not intended to diagnose MRSA infection nor to guide or monitor treatment for MRSA infections.       Radiology Studies: No results found.  Scheduled Meds: . enoxaparin (LOVENOX) injection  40 mg Subcutaneous Q24H  . FLUoxetine  20 mg Oral Daily  . folic acid  1 mg Oral Daily  . gabapentin  400 mg Oral TID  . insulin aspart  0-9 Units Subcutaneous Q4H  . ipratropium-albuterol  3 mL Nebulization Q6H  . lactulose  20 g Oral BID  . multivitamin with minerals  1 tablet Oral Daily  . nicotine  14 mg Transdermal Daily  . pantoprazole  40 mg Oral Q1200  . rifaximin  550 mg Oral BID  . risperiDONE  0.5 mg Oral BID  . thiamine  100 mg Oral Daily  . topiramate  100 mg Oral Daily   Continuous Infusions: . sodium chloride 10 mL/hr at 01/03/17 0947     LOS: 6 days   Time spent: 25 minutes.  Hazeline Junkeryan Danaja Lasota, MD [facility] Pager [phone]  If 7PM-7AM, please contact night-coverage [email or web address] Password [email or web address] 01/05/2017, 11:55 AM

## 2017-01-05 NOTE — Clinical Social Work Note (Signed)
Patient is agreeable to SNF placement. [facility] is first preference as she has been there before. She said they can go ahead and start insurance authorization if they can take her. She does not want CSW to call and discuss with family. CSW explained that authorization will need to be obtained and telesitter will need to be discontinued for 24 hours prior to discharge. PASARR is under manual review due to anxiety and bipolar diagnoses. Patient cannot go to SNF until PASARR obtained.  Charlynn Court, CSW [phone]

## 2017-01-05 NOTE — Care Management Note (Addendum)
Case Management Note  Patient Details  Name: Anne Black MRN: [id number] Date of Birth: 01/05/1961  Subjective/Objective:    Pt admitted with sepsis per abdominal                 Action/Plan:  PTA from home.  Pt has longstanding substance abuse with underlying ascites and frequent paracentesis.  Pt is s/p hernia repair with wound vac in place - CM will continue to follow for discharge needs.  CSW consulted for substance abuse    Expected Discharge Date:                  Expected Discharge Plan:     In-House Referral:     Discharge planning Services  CM Consult  Post Acute Care Choice:    Choice offered to:     DME Arranged:    DME Agency:     HH Arranged:    HH Agency:     Status of Service:     If discussed at [employer], dates discussed:    Additional Comments: CM attempted to discuss choice for wound vac with pt however pt was unable to decide. Surgery does not have a preference regarding [facility] or [facility] for wound vac.  PT evaluated pt and SNF is recommended due to lack of 24 hour supervision (confirmed by pt) at discharge and complexity with wound vac (pt had to be reminded by therapy to not "pick" at the vac_ - there are safety awareness concerns with pt with wound vac (pt currently has Recruitment consultant) .  CSW consulted and informed that pt will discharge with wound vac - CSW will work with facility regarding wound vac ([facility] form on shadow chart).   CM received consult for home vac - CM text paged surgery to determine which DME agency is preferred, request signature on wound vac form and request measurements of wound as required by DME company.   Cherylann Parr, RN 01/05/2017, 10:44 AM

## 2017-01-05 NOTE — Consult Note (Addendum)
WOC Nurse wound consult note Reason for Consult: Consult requested to change abd vac dressing.  Surgical PA was at the bedside to assess wound appearance during the first post-op dressing change. Wound type: Full thickness post-op wound to midline abd Measurement: 14X10.5X1cm Wound bed: 100% beefy red Drainage (amount, consistency, odor) Small amt pink drainage in the cannister, no odor Periwound: Intact skin surrounding Dressing procedure/placement/frequency: Applied one piece of black foam to 125mm cont suction.  Pt medicated for pain prior to procedure and tolerated with mod amt discomfort.  Plan for bedside nurses to change dressing Q M/W/F. Please re-consult if further assistance is needed.  Thank-you,  Aayansh Codispoti MSN, Cammie McgeeN, CWOCN, TaylorWCN-AP, CNS [phone]

## 2017-01-05 NOTE — Progress Notes (Signed)
6 Days Post-Op   Subjective/Chief Complaint: tol diet, having bowel function, awake alert   Objective: Vital signs in last 24 hours: Temp:  [98 F (36.7 C)-99.1 F (37.3 C)] 99 F (37.2 C) (11/28 0700) Pulse Rate:  [86-89] 86 (11/28 0700) Resp:  [19-30] 22 (11/28 0700) BP: (95-121)/(52-98) 106/68 (11/28 0700) SpO2:  [89 %-100 %] 91 % (11/28 0700) Weight:  [80.6 kg (177 lb 11.1 oz)] 80.6 kg (177 lb 11.1 oz) (11/28 0154) Last BM Date: 01/04/17  Intake/Output from previous day: 11/27 0701 - 11/28 0700 In: 1070 [P.O.:720; IV Piggyback:350] Out: 551 [Urine:550; Stool:1] Intake/Output this shift: No intake/output data recorded.  GI: soft approp tender vac in place  Lab Results:  Recent Labs    01/03/17 0322 01/04/17 0308  WBC 5.5 4.5  HGB 9.9* 9.0*  HCT 33.7* 29.7*  PLT 128* 131*   BMET Recent Labs    01/04/17 1504 01/05/17 0649  NA 139 138  K 3.0* 3.6  CL 100* 105  CO2 33* 28  GLUCOSE 130* 102*  BUN <5* <5*  CREATININE 0.42* 0.39*  CALCIUM 8.2* 8.4*   PT/INR No results for input(s): LABPROT, INR in the last 72 hours. ABG Recent Labs    01/03/17 0352  PHART 7.332*  HCO3 28.9*    Studies/Results: No results found.  Anti-infectives: Anti-infectives (From admission, onward)   Start     Dose/Rate Route Frequency Ordered Stop   01/03/17 1000  rifaximin (XIFAXAN) tablet 550 mg     550 mg Oral 2 times daily 01/03/17 0939     01/02/17 1100  rifaximin (XIFAXAN) tablet 200 mg  Status:  Discontinued     200 mg Oral 2 times daily 01/02/17 1055 01/03/17 0939   12/30/16 2300  vancomycin (VANCOCIN) IVPB 750 mg/150 ml premix  Status:  Discontinued     750 mg 150 mL/hr over 60 Minutes Intravenous Every 12 hours 12/30/16 1235 01/01/17 1251   12/30/16 1300  piperacillin-tazobactam (ZOSYN) IVPB 3.375 g  Status:  Discontinued     3.375 g 12.5 mL/hr over 240 Minutes Intravenous Every 8 hours 12/30/16 1231 01/05/17 0210   12/30/16 1000  vancomycin (VANCOCIN) IVPB 750  mg/150 ml premix  Status:  Discontinued     750 mg 150 mL/hr over 60 Minutes Intravenous Every 12 hours 12/30/16 0125 12/30/16 1233   12/30/16 0015  vancomycin (VANCOCIN) IVPB 1000 mg/200 mL premix     1,000 mg 200 mL/hr over 60 Minutes Intravenous  Once 12/30/16 0001 12/30/16 0503      Assessment/Plan: POD #5Laparotomy, partial omentectomy, primary umbilical hernia repair with suturesDwain Black- Anne Black -ChangeVACMonday Wednesday Friday, please call today with change so we can look at wound -regular diet -out of bed to chair Cirrhosis with varices secondary to alcohol CIWA Cellulitis-completed abx History of multiple sclerosis Alcohol abuse dispo- can be discharged from surgical standpoint whenever medically ready, will see again friday     Anne LoronMatthew Drenda Black 01/05/2017

## 2017-01-05 NOTE — NC FL2 (Signed)
[place] MEDICAID FL2 LEVEL OF CARE SCREENING TOOL     IDENTIFICATION  Patient Name: Anne ApplebaumCarla S Adriance Birthdate: [birth year] Sex: female Admission Date (Current Location): 12/29/2016  [facility] [place]   Facility and Address:  [facility] [place] [address], [place], [place] [address]      Provider Number: [id number]  Attending Physician Name and Address:  Tyrone NineGrunz, Ryan B, MD  Relative Name and Phone Number:       Current Level of Care: Hospital Recommended Level of Care: Skilled Nursing Facility Prior Approval Number:    Date Approved/Denied:   PASRR Number: Manual review  Discharge Plan: SNF    Current Diagnoses: Patient Active Problem List   Diagnosis Date Noted  . Abdominal wall cellulitis 12/30/2016  . S/P exploratory laparotomy 12/30/2016  . Distal radius fracture, left 09/09/2016  . Thrombocytopenia (HCC) 09/09/2016  . Chest x-ray abnormality   . Umbilical hernia without obstruction and without gangrene 05/12/2016  . Itching 02/24/2016  . Ventral hernia without obstruction or gangrene 02/24/2016  . Palliative care encounter   . Ascites   . Tachypnea   . Typical atrial flutter (HCC)   . Hypomagnesemia   . Hypoxia   . Dehydration 01/22/2016  . Low back ache 12/30/2015  . Non-intractable vomiting with nausea   . Alcohol abuse with alcohol-induced mood disorder (HCC) 12/15/2015  . Acute alcoholism (HCC)   . Abdominal distension 11/04/2015  . Alcohol abuse   . Dyspnea   . Acute respiratory failure (HCC) 10/15/2015  . Ascites due to alcoholic cirrhosis (HCC) 10/15/2015  . Osteoporosis   . Bipolar I disorder (HCC)   . Multiple sclerosis (HCC)   . Alcoholic cirrhosis of liver with ascites (HCC) 08/22/2015  . Bipolar disorder, current episode mixed, moderate (HCC) 08/01/2015  . Alcohol use disorder, severe, dependence (HCC) 07/31/2015  . Macrocytic anemia- due to alcohol abuse with normal B12 & folate levels  05/31/2015  . Severe protein-calorie malnutrition (HCC) 05/31/2015  . Hypokalemia 05/26/2015  . Encephalopathy, hepatic (HCC) 05/26/2015  . Alcohol withdrawal (HCC) 05/18/2015  . Abdominal pain 05/18/2015  . Anxiety 05/18/2015  . UTI (lower urinary tract infection) 05/18/2015  . Stimulant abuse (HCC) 12/27/2014  . Nicotine abuse 12/27/2014  . Hyperprolactinemia (HCC) 11/15/2014  . Dyslipidemia   . Tobacco abuse 01/23/2014  . Noncompliance with therapeutic plan 04/04/2013  . Unspecified hereditary and idiopathic peripheral neuropathy 05/01/2012  . GERD (gastroesophageal reflux disease) 05/01/2012  . Osteoarthrosis, unspecified whether generalized or localized, involving lower leg 05/01/2012  . Hyponatremia 07/01/2011    Orientation RESPIRATION BLADDER Height & Weight     Self, Situation, Time, Place  Normal, Other (Comment)(Has not used bipap since 11/24 and order was discontinued today: 14/6 at 50%.) Continent Weight: 177 lb 11.1 oz (80.6 kg) Height:  5\' 2"  (157.5 cm)  BEHAVIORAL SYMPTOMS/MOOD NEUROLOGICAL BOWEL NUTRITION STATUS  (Calm, Cooperative.) (None) Continent Diet(Soft)  AMBULATORY STATUS COMMUNICATION OF NEEDS Skin   Limited Assist Verbally Wound Vac, Other (Comment), Skin abrasions, Bruising, Surgical wounds(Skin tear. [facility] wound vac on mid abdomen: Changed MWF.)                       Personal Care Assistance Level of Assistance  Bathing, Feeding, Dressing Bathing Assistance: Limited assistance Feeding assistance: Limited assistance Dressing Assistance: Limited assistance     Functional Limitations Info  Sight, Hearing, Speech Sight Info: Adequate Hearing Info: Adequate Speech Info: Adequate    SPECIAL  CARE FACTORS FREQUENCY  PT (By licensed PT), OT (By licensed OT)     PT Frequency: 5 x week OT Frequency: 5 x week            Contractures Contractures Info: Not present    Additional Factors Info  Code Status, Allergies, Psychotropic Code Status  Info: Full Allergies Info: NKDA Psychotropic Info: Anxiety, Bipolar: Prozac 20 mg PO daily, Risperdal 0.5 mg PO BID.         Current Medications (01/05/2017):  This is the current hospital active medication list Current Facility-Administered Medications  Medication Dose Route Frequency Provider Last Rate Last Dose  . 0.9 %  sodium chloride infusion   Intravenous Continuous Simonne Martinet, NP 10 mL/hr at 01/03/17 0947    . acetaminophen (TYLENOL) tablet 650 mg  650 mg Oral Q6H PRN Simonne Martinet, NP   650 mg at 01/05/17 1610  . enoxaparin (LOVENOX) injection 40 mg  40 mg Subcutaneous Q24H Roslynn Amble, MD   40 mg at 01/05/17 1211  . FLUoxetine (PROZAC) capsule 20 mg  20 mg Oral Daily Simonne Martinet, NP   20 mg at 01/05/17 0846  . folic acid (FOLVITE) tablet 1 mg  1 mg Oral Daily Roslynn Amble, MD   1 mg at 01/05/17 0847  . furosemide (LASIX) tablet 40 mg  40 mg Oral Daily Hazeline Junker B, MD      . gabapentin (NEURONTIN) capsule 400 mg  400 mg Oral TID Simonne Martinet, NP   400 mg at 01/05/17 0846  . insulin aspart (novoLOG) injection 0-9 Units  0-9 Units Subcutaneous Q4H Roslynn Amble, MD   1 Units at 01/05/17 0033  . ipratropium-albuterol (DUONEB) 0.5-2.5 (3) MG/3ML nebulizer solution 3 mL  3 mL Nebulization Q6H Roslynn Amble, MD   3 mL at 01/05/17 0930  . lactulose (CHRONULAC) 10 GM/15ML solution 20 g  20 g Oral BID Simonne Martinet, NP   20 g at 01/05/17 0844  . metoprolol tartrate (LOPRESSOR) injection 2.5-5 mg  2.5-5 mg Intravenous Q3H PRN Oretha Milch, MD      . modafinil (PROVIGIL) tablet 200 mg  200 mg Oral Daily Tyrone Nine, MD      . multivitamin with minerals tablet 1 tablet  1 tablet Oral Daily Claud Kelp, MD   1 tablet at 01/05/17 0846  . nicotine (NICODERM CQ - dosed in mg/24 hours) patch 14 mg  14 mg Transdermal Daily Roslynn Amble, MD   14 mg at 01/04/17 1006  . ondansetron (ZOFRAN-ODT) disintegrating tablet 4 mg  4 mg Oral Q6H PRN  [name], MD   4 mg at 01/03/17 1215   Or  . ondansetron [facility]) injection 4 mg  4 mg Intravenous Q6H PRN [name], MD   4 mg at 01/05/17 1128  . oxyCODONE (Oxy IR/ROXICODONE) immediate release tablet 5 mg  5 mg Oral Q6H PRN Mannam, Praveen, MD   5 mg at 01/05/17 1210  . pantoprazole (PROTONIX) EC tablet 40 mg  40 mg Oral Q1200 Simonne Martinet, NP   40 mg at 01/05/17 1211  . rifaximin (XIFAXAN) tablet 550 mg  550 mg Oral BID Simonne Martinet, NP   550 mg at 01/05/17 0847  . risperiDONE (RISPERDAL) tablet 0.5 mg  0.5 mg Oral BID Simonne Martinet, NP   0.5 mg at 01/05/17 0847  . spironolactone (ALDACTONE) tablet 100 mg  100 mg Oral Daily Tyrone Nine,  MD      . thiamine (VITAMIN B-1) tablet 100 mg  100 mg Oral Daily Roslynn Amble, MD   100 mg at 01/05/17 0846  . topiramate (TOPAMAX) tablet 100 mg  100 mg Oral Daily Simonne Martinet, NP   100 mg at 01/05/17 1308     Discharge Medications: Please see discharge summary for a list of discharge medications.  Relevant Imaging Results:  Relevant Lab Results:   Additional Information SS#: [id number]. Currently has a telesitter for fall risk and confusion. Will be dc'ed 24 hours prior to discharge.  Margarito Liner, LCSW

## 2017-01-05 NOTE — Progress Notes (Signed)
Wound check today during wound vac changed revealed a well healing wound with a good base of granulation tissue and no purulent drainage. Pt tolerated change well.    We will see pt Friday during wound vac change.  Mattie Marlin, [facility] Pager [phone]

## 2017-01-06 LAB — CBC
HEMATOCRIT: 34.6 % — AB (ref 36.0–46.0)
Hemoglobin: 10.6 g/dL — ABNORMAL LOW (ref 12.0–15.0)
MCH: 27.8 pg (ref 26.0–34.0)
MCHC: 30.6 g/dL (ref 30.0–36.0)
MCV: 90.8 fL (ref 78.0–100.0)
Platelets: 156 10*3/uL (ref 150–400)
RBC: 3.81 MIL/uL — AB (ref 3.87–5.11)
RDW: 15.1 % (ref 11.5–15.5)
WBC: 5.6 10*3/uL (ref 4.0–10.5)

## 2017-01-06 LAB — GLUCOSE, CAPILLARY
GLUCOSE-CAPILLARY: 101 mg/dL — AB (ref 65–99)
GLUCOSE-CAPILLARY: 102 mg/dL — AB (ref 65–99)
GLUCOSE-CAPILLARY: 114 mg/dL — AB (ref 65–99)
GLUCOSE-CAPILLARY: 121 mg/dL — AB (ref 65–99)
GLUCOSE-CAPILLARY: 95 mg/dL (ref 65–99)
Glucose-Capillary: 116 mg/dL — ABNORMAL HIGH (ref 65–99)
Glucose-Capillary: 95 mg/dL (ref 65–99)

## 2017-01-06 LAB — BASIC METABOLIC PANEL
ANION GAP: 6 (ref 5–15)
BUN: <5 mg/dL — ABNORMAL LOW (ref 6–20)
CHLORIDE: 103 mmol/L (ref 101–111)
CO2: 28 mmol/L (ref 22–32)
Calcium: 8.8 mg/dL — ABNORMAL LOW (ref 8.9–10.3)
Creatinine, Ser: 0.47 mg/dL (ref 0.44–1.00)
GFR calc non Af Amer: >60 mL/min (ref >60)
Glucose, Bld: 103 mg/dL — ABNORMAL HIGH (ref 65–99)
POTASSIUM: 3.2 mmol/L — AB (ref 3.5–5.1)
Sodium: 137 mmol/L (ref 135–145)

## 2017-01-06 LAB — MAGNESIUM: Magnesium: 1.6 mg/dL — ABNORMAL LOW (ref 1.7–2.4)

## 2017-01-06 MED ORDER — BUSPIRONE HCL 15 MG PO TABS
15.0000 mg | ORAL_TABLET | Freq: Two times a day (BID) | ORAL | Status: DC
  Administered 2017-01-06 – 2017-01-10 (×10): 15 mg via ORAL
  Filled 2017-01-06 (×11): qty 1

## 2017-01-06 MED ORDER — INSULIN ASPART 100 UNIT/ML ~~LOC~~ SOLN: 0.0000 [IU] | Freq: Every day | SUBCUTANEOUS | Status: DC

## 2017-01-06 MED ORDER — MAGNESIUM SULFATE 2 GM/50ML IV SOLN
2.0000 g | Freq: Once | INTRAVENOUS | Status: AC
  Administered 2017-01-06: 2 g via INTRAVENOUS
  Filled 2017-01-06: qty 50

## 2017-01-06 MED ORDER — WHITE PETROLATUM EX OINT
TOPICAL_OINTMENT | CUTANEOUS | Status: AC
  Filled 2017-01-06: qty 28.35

## 2017-01-06 MED ORDER — SUCRALFATE 1 GM/10ML PO SUSP
1.0000 g | Freq: Three times a day (TID) | ORAL | Status: DC
  Administered 2017-01-06 – 2017-01-11 (×13): 1 g via ORAL
  Filled 2017-01-06 (×16): qty 10

## 2017-01-06 MED ORDER — IPRATROPIUM-ALBUTEROL 0.5-2.5 (3) MG/3ML IN SOLN
3.0000 mL | RESPIRATORY_TRACT | Status: DC
  Administered 2017-01-06 – 2017-01-07 (×8): 3 mL via RESPIRATORY_TRACT
  Filled 2017-01-06 (×8): qty 3

## 2017-01-06 MED ORDER — MOMETASONE FURO-FORMOTEROL FUM 100-5 MCG/ACT IN AERO
2.0000 | INHALATION_SPRAY | Freq: Two times a day (BID) | RESPIRATORY_TRACT | Status: DC
  Administered 2017-01-06 – 2017-01-11 (×9): 2 via RESPIRATORY_TRACT
  Filled 2017-01-06: qty 8.8

## 2017-01-06 MED ORDER — POTASSIUM CHLORIDE CRYS ER 20 MEQ PO TBCR
40.0000 meq | EXTENDED_RELEASE_TABLET | Freq: Two times a day (BID) | ORAL | Status: AC
  Administered 2017-01-06 (×2): 40 meq via ORAL
  Filled 2017-01-06 (×2): qty 2

## 2017-01-06 MED ORDER — INSULIN ASPART 100 UNIT/ML ~~LOC~~ SOLN: 0.0000 [IU] | Freq: Three times a day (TID) | SUBCUTANEOUS | Status: DC

## 2017-01-06 NOTE — Progress Notes (Signed)
PROGRESS NOTE  Anne Black  ZOX:[id number] DOB: May 07, 1960 DOA: 12/29/2016 PCP: Patient, No Pcp Per  Brief Narrative: 56 y.o. female with long-standing history of alcohol abuse and underlying liver cirrhosis with history of ascites and paracentesis. Patient also has known multiple sclerosis followed at [facility] as well as bipolar disorder. Presented yesterday to hospital with 2 day history of body cramps. Also endorses generalized malaise, fatigue, decreased appetite, and nausea. Patient endorsed drinking 4-5 glasses of wine daily on average with last alcoholic beverage on 11/20. Abdominal CT imaging in the emergency department on 11/21 showed a ventral wall hernia with surrounding edema. Patient endorsed new purulent drainage from open wound and surrounding erythema from abdominal wall hernia. On 11/22, she was taken to the OR for laparotomy, partial omentectomy, primary umbilical hernia repair with sutures and negative pressure dressing. CCM assumed care. The patient was extubated 11/24 and required precedex infusion for alcohol withdrawal which was weaned.  Assessment & Plan: Principal Problem:   Abdominal wall cellulitis Active Problems:   Hypokalemia   Alcohol use disorder, severe, dependence (HCC)   Alcoholic cirrhosis of liver with ascites (HCC)   Ventral hernia without obstruction or gangrene   S/P exploratory laparotomy  Sepsis due to ventral hernia with incarceration s/p laparotomy with hernia repair:  - Wound care MWF w/vac per surgery - Optimize nutrition, regular diet per surgery - Completed zosyn  Hepatic cirrhosis with ascites and varices:  - Continue rifaximin and lactulose, not currently encephalopathic - Restarted lasix, spironolactone 11/28.  GERD:  - Continue PPI, add carafate  Acute encephalopathy: Related to alcohol withdrawal, acute illness/delirium, and hepatic encephalopathy.  - Resolved. DC sitter.  Paroxysmal atrial fibrillation: Has converted to NSR,  responsive to beta blocker, suspect she' susceptible to hypokalemia/hypomagnesemia.  - Continue metoprolol prn - Monitor electrolytes, maintain K > 4 and Mg > 2. Replace today.  - Continue heart monitoring while inpatient.  - Recommend cardiology follow up. Consider starting aspirin (CHA2DS2-VASc is 1), defer to surgery.  Acute hypoxic and hypercarbic respiratory failure: Multifactorial in setting of acute illness, tobacco use disorder contributing.  - Resolving - Continue duonebs, IS, OOB  Multiple sclerosis:  - Follow up at [facility]  Bipolar disorder:  - Restarted home medications prozac, buspar, risperidone, neurontin, topiramate.   Anemia: Mild. No signs of active bleeding. - Monitor  Thrombocytopenia: Resolved. Likely secondary to consumption. - Monitor intermittently, continue DVT ppx  DVT prophylaxis: Lovenox Code Status: Full Family Communication: None at bedside Disposition Plan: Continue management on medical floor, delirium resolving. Eventual DC to SNF. Timing per surgery recommendations.   Consultants:   CCM  Surgery  Procedures:  11/22 - Ex Lap for incarcerated omental hernia w/ wound vac placement >> returned to ICU on vent 11/24 - BiPAP overnight for hypercarbia & altered mentation. Taken off of BiPAP in AM. Altered mentation w/ likely EtOH withdrawal >> started Precedex gtt.  Antimicrobials:  Vancomycin 11/22 - 11/24  Zosyn 11/22 - 11/28   Subjective: Abdominal discomfort worse with food. Takes carafate for this at home. No agitation in past 24 hours. No fevers.   Objective: Vitals:   01/05/17 2127 01/06/17 0135 01/06/17 0502 01/06/17 0926  BP: (!) 106/55 (!) 120/52 (!) 98/46   Pulse: 75 86 86   Resp: 20 18 18    Temp: 99.1 F (37.3 C) 98.7 F (37.1 C) 99.3 F (37.4 C)   TempSrc: Oral Oral Oral   SpO2: 94% 95% 93% 97%  Weight:      Height:  Intake/Output Summary (Last 24 hours) at 01/06/2017 1417 Last data filed at 01/06/2017  1320 Gross per 24 hour  Intake 360 ml  Output 2675 ml  Net -2315 ml   Filed Weights   12/30/16 1200 12/31/16 0603 01/05/17 0154  Weight: 67.9 kg (149 lb 11.1 oz) 78 kg (171 lb 15.3 oz) 80.6 kg (177 lb 11.1 oz)   Gen: Chronically ill-appearing 56 y.o. female in no distress Pulm: Non-labored breathing. Mild basilar crackles clear with cough. CV: Regular rate and rhythm. No murmur, rub, or gallop. No JVD, no pedal edema. GI: Abdomen soft, appropriately tender with midline incisional wound with vac in place. No rebound or guarding. Ext: Warm, no deformities Skin: No rashes, lesions no ulcers Neuro: Alert and oriented. No focal neurological deficits. Psych: Judgement and insight appear fair. Mood & affect appropriate, stable. No SI/HI, not hallucinating and not responding to internal stimuli.   CBC: Recent Labs  Lab 12/31/16 0311 01/01/17 0223 01/02/17 0323 01/03/17 0322 01/04/17 0308 01/06/17 0457  WBC 9.6 9.1 7.1 5.5 4.5 5.6  NEUTROABS 7.6 7.3 5.2 3.8  --   --   HGB 9.8* 9.6* 9.7* 9.9* 9.0* 10.6*  HCT 32.1* 32.0* 32.3* 33.7* 29.7* 34.6*  MCV 92.8 95.2 96.1 94.7 92.5 90.8  PLT 128* 131* 142* 128* 131* 156   Basic Metabolic Panel: Recent Labs  Lab 12/31/16 0311 01/01/17 0223  01/02/17 0323 01/03/17 0322 01/04/17 0308 01/04/17 1504 01/05/17 0649 01/06/17 0457  NA 136 139   < > 135 140 137 139 138 137  K 3.9 2.7*   < > 3.5 3.3* 2.8* 3.0* 3.6 3.2*  CL 101 100*   < > 98* 104 101 100* 105 103  CO2 30 33*   < > 30 29 29  33* 28 28  GLUCOSE 105* 105*   < > 106* 96 95 130* 102* 103*  BUN <5* <5*   < > 6 5* <5* <5* <5* <5*  CREATININE 0.53 0.47   < > 0.44 0.50 0.38* 0.42* 0.39* 0.47  CALCIUM 8.2* 8.0*   < > 8.2* 8.2* 7.9* 8.2* 8.4* 8.8*  MG 1.8 1.5*  --  2.0 1.5*  --  2.0 1.8 1.6*  PHOS 2.7 3.4  --  2.5 3.0  --   --   --   --    < > = values in this interval not displayed.   GFR: Estimated Creatinine Clearance: 76.3 mL/min (by C-G formula based on SCr of 0.47  mg/dL). Liver Function Tests: Recent Labs  Lab 12/31/16 0311 01/01/17 0223 01/02/17 0323 01/03/17 0322  AST 24  --   --  19  ALT 13*  --   --  13*  ALKPHOS 95  --   --  84  BILITOT 1.3*  --   --  1.2  PROT 5.8*  --   --  5.9*  ALBUMIN 3.0* 3.1* 2.8* 2.7*   Recent Labs  Lab 01/02/17 1126  AMMONIA 68*   Coagulation Profile: Recent Labs  Lab 12/31/16 0311  INR 1.20   Urine analysis:    Component Value Date/Time   COLORURINE STRAW (A) 12/29/2016 2130   APPEARANCEUR CLEAR 12/29/2016 2130   LABSPEC 1.005 12/29/2016 2130   PHURINE 7.0 12/29/2016 2130   GLUCOSEU NEGATIVE 12/29/2016 2130   HGBUR NEGATIVE 12/29/2016 2130   BILIRUBINUR NEGATIVE 12/29/2016 2130   BILIRUBINUR moderate (A) 06/19/2015 1906   KETONESUR NEGATIVE 12/29/2016 2130   PROTEINUR NEGATIVE 12/29/2016 2130   UROBILINOGEN >=8.0 06/19/2015  1906   UROBILINOGEN 0.2 09/14/2011 2141   NITRITE NEGATIVE 12/29/2016 2130   LEUKOCYTESUR NEGATIVE 12/29/2016 2130   Recent Results (from the past 240 hour(s))  Culture, blood (routine x 2)     Status: None   Collection Time: 12/30/16  1:30 AM  Result Value Ref Range Status   Specimen Description BLOOD RIGHT HAND  Final   Special Requests   Final    BOTTLES DRAWN AEROBIC AND ANAEROBIC Blood Culture adequate volume   Culture NO GROWTH 5 DAYS  Final   Report Status 01/04/2017 FINAL  Final  Culture, blood (routine x 2)     Status: None   Collection Time: 12/30/16  2:45 AM  Result Value Ref Range Status   Specimen Description BLOOD RIGHT FOREARM  Final   Special Requests   Final    BOTTLES DRAWN AEROBIC AND ANAEROBIC Blood Culture adequate volume   Culture NO GROWTH 5 DAYS  Final   Report Status 01/04/2017 FINAL  Final  MRSA PCR Screening     Status: None   Collection Time: 12/30/16 12:27 PM  Result Value Ref Range Status   MRSA by PCR NEGATIVE NEGATIVE Final    Comment:        The GeneXpert MRSA Assay (FDA approved for NASAL specimens only), is one component of  a comprehensive MRSA colonization surveillance program. It is not intended to diagnose MRSA infection nor to guide or monitor treatment for MRSA infections.       Radiology Studies: No results found.  Scheduled Meds: . busPIRone  15 mg Oral BID  . enoxaparin (LOVENOX) injection  40 mg Subcutaneous Q24H  . FLUoxetine  20 mg Oral Daily  . folic acid  1 mg Oral Daily  . furosemide  40 mg Oral Daily  . gabapentin  400 mg Oral TID  . insulin aspart  0-5 Units Subcutaneous QHS  . insulin aspart  0-9 Units Subcutaneous TID WC  . ipratropium-albuterol  3 mL Nebulization TID  . lactulose  20 g Oral BID  . modafinil  200 mg Oral Daily  . mometasone-formoterol  2 puff Inhalation BID  . multivitamin with minerals  1 tablet Oral Daily  . nicotine  14 mg Transdermal Daily  . pantoprazole  40 mg Oral Q1200  . rifaximin  550 mg Oral BID  . risperiDONE  0.5 mg Oral BID  . spironolactone  100 mg Oral Daily  . sucralfate  1 g Oral TID WC & HS  . thiamine  100 mg Oral Daily  . topiramate  100 mg Oral Daily   Continuous Infusions: . sodium chloride 10 mL/hr at 01/03/17 0947     LOS: 7 days   Time spent: 25 minutes.  Hazeline Junkeryan Jacklynn Dehaas, MD [facility] Pager [phone]  If 7PM-7AM, please contact night-coverage [email or web address] Password [email or web address] 01/06/2017, 2:17 PM

## 2017-01-06 NOTE — Progress Notes (Signed)
Pt. refused her medication refused lactulose despite the explanation given.She finally agree to take the Buspar,Gabapentine and Carafate. Pt states "I'm not taking the lactulose and the other medications,I need to to take them when I needed".

## 2017-01-06 NOTE — Care Management Important Message (Signed)
Important Message  Patient Details  Name: Anne Black MRN: [id number] Date of Birth: 06-21-1960   Medicare Important Message Given:  Yes    Torben Soloway Stefan Church 01/06/2017, 11:43 AM

## 2017-01-06 NOTE — Progress Notes (Signed)
Patient 90% on room air. Placed patient on 2lpm nasal cannula after breathing treatment administered

## 2017-01-06 NOTE — Progress Notes (Signed)
Tele Sitter DCed at this time 

## 2017-01-06 NOTE — Progress Notes (Signed)
Occupational Therapy Evaluation Patient Details Name: Anne Black MRN: [id number] DOB: 11-Jul-1960 Today's Date: 01/06/2017    History of Present Illness Pt is a 56 y.o. female with PMH significant of alcohol abuse, cirrhosis, hypokalemia, bipolar disorder, and MS followed at [facility]; who presents on 12/29/16 with c/o having cramps all over body for last 2 days. Now s/p repair of abdominal wall ventral hernia with wound vac placement on 11/22.     Clinical Impression   PTA, pt lived alone and was independent with ADL and mobility. 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 Pt currently requires min A with mobility and ADL @ RW level. Pt will benefit from rehab at a SNF to facilitate safe return home @ modified independent level. Will follow acutely to address established goals and facilitate DC to SNF.     Follow Up Recommendations  SNF;Supervision/Assistance - 24 hour    Equipment Recommendations  3 in 1 bedside commode    Recommendations for Other Services       Precautions / Restrictions Precautions Precautions: Fall Precaution Comments: Abdominal wound vac Restrictions Weight Bearing Restrictions: No      Mobility Bed Mobility               General bed mobility comments: sittin gEOB with tech  Transfers Overall transfer level: Needs assistance Equipment used: Rolling walker (2 wheeled) Transfers: Sit to/from Stand Sit to Stand: Min assist              Balance Overall balance assessment: Needs assistance   Sitting balance-Leahy Scale: Fair       Standing balance-Leahy Scale: Poor Standing balance comment: reliant on RW                           ADL either performed or assessed with clinical judgement   ADL Overall ADL's : Needs assistance/impaired Eating/Feeding: Modified independent   Grooming: Set up;Sitting   Upper Body Bathing: Set up;Sitting   Lower Body Bathing: Minimal assistance;Sit to/from  stand Lower Body Bathing Details (indicate cue type and reason): difficulty reaching feet due to abdominal pain Upper Body Dressing : Set up;Sitting   Lower Body Dressing: Minimal assistance;Sit to/from stand   Toilet Transfer: Minimal assistance;RW;Ambulation;BSC   Toileting- Clothing Manipulation and Hygiene: Minimal assistance;Sit to/from stand Toileting - Clothing Manipulation Details (indicate cue type and reason): Pt reports she was incontinenet earlier this am     Functional mobility during ADLs: Minimal assistance;Rolling walker;Cueing for safety;Cueing for sequencing       Vision         Perception     Praxis      Pertinent Vitals/Pain Pain Assessment: 0-10 Pain Score: 6  Pain Location: Abdomen Pain Descriptors / Indicators: Operative site guarding;Grimacing Pain Intervention(s): Limited activity within patient's tolerance     Hand Dominance Right   Extremity/Trunk Assessment Upper Extremity Assessment Upper Extremity Assessment: Generalized weakness   Lower Extremity Assessment Lower Extremity Assessment: Defer to PT evaluation   Cervical / Trunk Assessment Cervical / Trunk Assessment: Other exceptions(guarding of abdomen wound)   Communication Communication Communication: No difficulties   Cognition Arousal/Alertness: Awake/alert Behavior During Therapy: Flat affect Overall Cognitive Status: No family/caregiver present to determine baseline cognitive functioning                                 General Comments: Pt A&Ox4.  SLOW PROCESSING; CUES FOR PROBLEM SOLVING DURING MOBILITY; appears internally distracted   General Comments       Exercises     Shoulder Instructions      Home Living Family/patient expects to be discharged to:: Skilled nursing facility Living Arrangements: Alone Available Help at Discharge: Family;Available PRN/intermittently Type of Home: House Home Access: Stairs to enter Entergy CorporationEntrance Stairs-Number of Steps:  2   Home Layout: Able to live on main level with bedroom/bathroom;Multi-level     Bathroom Shower/Tub: Walk-in shower         Home Equipment: Environmental consultantWalker - 2 wheels;Cane - single point   Additional Comments: Lives alone and does not have family who can stay to assist      Prior Functioning/Environment Level of Independence: Independent        Comments: staes she has assistnace when needed; "don't drive much"        OT Problem List: Decreased strength;Decreased activity tolerance;Impaired balance (sitting and/or standing);Decreased safety awareness;Decreased knowledge of use of DME or AE;Obesity;Pain      OT Treatment/Interventions: Self-care/ADL training;Therapeutic exercise;Energy conservation;DME and/or AE instruction;Therapeutic activities;Patient/family education;Cognitive remediation/compensation    OT Goals(Current goals can be found in the care plan section) Acute Rehab OT Goals Patient Stated Goal: to get stronger/better OT Goal Formulation: With patient Time For Goal Achievement: 01/20/17 Potential to Achieve Goals: Good ADL Goals Pt Will Perform Lower Body Bathing: with modified independence;sit to/from stand Pt Will Perform Lower Body Dressing: with modified independence;sit to/from stand Pt Will Transfer to Toilet: with modified independence;bedside commode;ambulating Pt Will Perform Toileting - Clothing Manipulation and hygiene: with modified independence;sit to/from stand  OT Frequency: Min 2X/week   Barriers to D/C:            Co-evaluation              AM-PAC PT "6 Clicks" Daily Activity     Outcome Measure Help from another person eating meals?: None Help from another person taking care of personal grooming?: None Help from another person toileting, which includes using toliet, bedpan, or urinal?: A Little Help from another person bathing (including washing, rinsing, drying)?: A Little Help from another person to put on and taking off regular  upper body clothing?: A Little Help from another person to put on and taking off regular lower body clothing?: A Little 6 Click Score: 20   End of Session Equipment Utilized During Treatment: Gait belt;Rolling walker;Other (comment)(wound vac) Nurse Communication: Mobility status  Activity Tolerance: Patient tolerated treatment well Patient left: in chair;with call bell/phone within reach;with chair alarm set;with nursing/sitter in room(tele sitter)  OT Visit Diagnosis: Unsteadiness on feet (R26.81);Muscle weakness (generalized) (M62.81);Pain Pain - part of body: (abdomen)                Time: [id number] OT Time Calculation (min): 21 min Charges:  OT General Charges $OT Visit: 1 Visit OT Evaluation $OT Eval Moderate Complexity: 1 Mod G-Codes:     Aiven Kampe, OT/L  [phone] 01/06/2017  Cesiah Westley,HILLARY 01/06/2017, 11:42 AM

## 2017-01-07 ENCOUNTER — Encounter (HOSPITAL_COMMUNITY): Payer: Self-pay | Admitting: General Practice

## 2017-01-07 LAB — BASIC METABOLIC PANEL
ANION GAP: 6 (ref 5–15)
BUN: <5 mg/dL — ABNORMAL LOW (ref 6–20)
CHLORIDE: 108 mmol/L (ref 101–111)
CO2: 24 mmol/L (ref 22–32)
Calcium: 8.8 mg/dL — ABNORMAL LOW (ref 8.9–10.3)
Creatinine, Ser: 0.49 mg/dL (ref 0.44–1.00)
GFR calc non Af Amer: >60 mL/min (ref >60)
Glucose, Bld: 104 mg/dL — ABNORMAL HIGH (ref 65–99)
POTASSIUM: 3.7 mmol/L (ref 3.5–5.1)
SODIUM: 138 mmol/L (ref 135–145)

## 2017-01-07 LAB — MAGNESIUM: MAGNESIUM: 1.8 mg/dL (ref 1.7–2.4)

## 2017-01-07 LAB — GLUCOSE, CAPILLARY
GLUCOSE-CAPILLARY: 115 mg/dL — AB (ref 65–99)
Glucose-Capillary: 105 mg/dL — ABNORMAL HIGH (ref 65–99)
Glucose-Capillary: 162 mg/dL — ABNORMAL HIGH (ref 65–99)

## 2017-01-07 MED ORDER — ACETAMINOPHEN 325 MG PO TABS
650.0000 mg | ORAL_TABLET | Freq: Four times a day (QID) | ORAL | Status: DC | PRN
  Administered 2017-01-07 – 2017-01-10 (×6): 650 mg via ORAL
  Filled 2017-01-07 (×7): qty 2

## 2017-01-07 MED ORDER — IPRATROPIUM-ALBUTEROL 0.5-2.5 (3) MG/3ML IN SOLN
3.0000 mL | Freq: Four times a day (QID) | RESPIRATORY_TRACT | Status: DC
  Administered 2017-01-08 – 2017-01-09 (×5): 3 mL via RESPIRATORY_TRACT
  Filled 2017-01-07 (×6): qty 3

## 2017-01-07 NOTE — Progress Notes (Signed)
Physical Therapy Treatment Patient Details Name: Anne ApplebaumCarla S Black MRN: [id number] DOB: July 29, 1960 Today's Date: 01/07/2017    History of Present Illness Pt is a 56 y.o. female with PMH significant of alcohol abuse, cirrhosis, hypokalemia, bipolar disorder, and [facility]; who presents on 12/29/16 with c/o having cramps all over body for last 2 days. Now s/p repair of abdominal wall ventral hernia with wound vac placement on 11/22.      PT Comments    Patient tolerated increased gait distance this session. Pt continues to require assist for safe functional transfers and ambulation due to limited activity tolerance, balance deficits, and decreased safety awareness. Current plan remains appropriate.    Follow Up Recommendations  SNF;Supervision/Assistance - 24 hour     Equipment Recommendations  Other (comment)(TBD next venue)    Recommendations for Other Services       Precautions / Restrictions Precautions Precautions: Fall Precaution Comments: Abdominal wound vac Restrictions Weight Bearing Restrictions: No    Mobility  Bed Mobility               General bed mobility comments: sitting EOB with tech upon arrival  Transfers Overall transfer level: Needs assistance Equipment used: Rolling walker (2 wheeled) Transfers: Sit to/from Stand Sit to Stand: Min assist         General transfer comment: cues for safe hand placement however pt did not follow cues; assist for balance while standing and to stabilize RW; assist to manage RW and cues for safe use when returning to sit down as pt tends to leave RW behind and sit prematurely  Ambulation/Gait Ambulation/Gait assistance: Min assist Ambulation Distance (Feet): 90 Feet Assistive device: Rolling walker (2 wheeled) Gait Pattern/deviations: Step-through pattern;Decreased stride length Gait velocity: Decreased   General Gait Details: cues for posture and safe use of AD; pt unsteady especially when turning and  stepping backwards   Stairs            Wheelchair Mobility    Modified Rankin (Stroke Patients Only)       Balance     Sitting balance-Leahy Scale: Fair       Standing balance-Leahy Scale: Poor                              Cognition Arousal/Alertness: Awake/alert Behavior During Therapy: WFL for tasks assessed/performed Overall Cognitive Status: No family/caregiver present to determine baseline cognitive functioning Area of Impairment: Attention;Memory;Safety/judgement;Awareness;Problem solving                   Current Attention Level: Selective Memory: Decreased short-term memory   Safety/Judgement: Decreased awareness of safety;Decreased awareness of deficits Awareness: Intellectual Problem Solving: Slow processing;Decreased initiation;Difficulty sequencing;Requires verbal cues General Comments: pt is easily distracted by pain and emotional during session and crying but could not express why she was emotional      Exercises      General Comments General comments (skin integrity, edema, etc.): pt with SpO2 desat into 80s while ambulating and educated on pursed lip breathing      Pertinent Vitals/Pain Pain Assessment: Faces Faces Pain Scale: Hurts even more Pain Location: Abdomen Pain Descriptors / Indicators: Operative site guarding;Grimacing;Moaning Pain Intervention(s): Monitored during session;Limited activity within patient's tolerance;Premedicated before session;Repositioned    Home Living                      Prior Function  PT Goals (current goals can now be found in the care plan section) Progress towards PT goals: Progressing toward goals    Frequency    Min 2X/week      PT Plan Current plan remains appropriate    Co-evaluation              AM-PAC PT "6 Clicks" Daily Activity  Outcome Measure  Difficulty turning over in bed (including adjusting bedclothes, sheets and blankets)?: A  Little Difficulty moving from lying on back to sitting on the side of the bed? : A Lot Difficulty sitting down on and standing up from a chair with arms (e.g., wheelchair, bedside commode, etc,.)?: Unable Help needed moving to and from a bed to chair (including a wheelchair)?: A Little Help needed walking in hospital room?: A Little Help needed climbing 3-5 steps with a railing? : A Lot 6 Click Score: 14    End of Session Equipment Utilized During Treatment: Gait belt Activity Tolerance: Patient limited by pain Patient left: in chair;with call bell/phone within reach;with chair alarm set Nurse Communication: Mobility status PT Visit Diagnosis: Other abnormalities of gait and mobility (R26.89);Muscle weakness (generalized) (M62.81)     Time: 5053-9767 PT Time Calculation (min) (ACUTE ONLY): 27 min  Charges:  $Gait Training: 8-22 mins $Therapeutic Activity: 8-22 mins                    G Codes:       Erline Levine, PTA Pager: [phone]     Carolynne Edouard 01/07/2017, 11:53 AM

## 2017-01-07 NOTE — Progress Notes (Signed)
[facility]/Trauma Progress Note  8 Days Post-Op   Assessment/Plan Principal Problem:   Abdominal wall cellulitis Active Problems:   Hypokalemia   Alcohol use disorder, severe, dependence (HCC)   Alcoholic cirrhosis of liver with ascites (HCC)   Ventral hernia without obstruction or gangrene   S/P exploratory laparotomy  Ventral Hernia - S/P Laparotomy, partial omentectomy, primary umbilical hernia repair with sutures, placement negative pressure dressing, 11/22, Dr. Dwain Sarna -[facility],W,F Cirrhosis with varices secondary to alcohol abuse CIWA Cellulitis- resolved, not on abx currently History of multiple sclerosis  FEN: reg diet VTE: SCD's, lovenox ID: Rifaxmin 11/25>>   Zosyn 11/22-11/27 Foley: none Follow up: Dr. Dwain Sarna x 3 weeks  DISPO: advance to reg diet. Pt is okay for discharge from a surgical standpoint. We will see pt again on Monday if she is still here.     LOS: 8 days    Subjective: CC: wound pain  Pt states pain at site of wound. She had some bouts of diarrhea yesterday. She is tolerating her diet without nausea or vomiting. She is pulling around 1000 on IS. She would like a regular diet.   Objective: Vital signs in last 24 hours: Temp:  [98.2 F (36.8 C)-98.5 F (36.9 C)] 98.2 F (36.8 C) (11/30 0428) Pulse Rate:  [77-83] 77 (11/30 0428) Resp:  [16-18] 16 (11/30 0428) BP: (96-106)/(48-71) 96/48 (11/30 0428) SpO2:  [90 %-100 %] 99 % (11/30 0428) FiO2 (%):  [98 %] 98 % (11/29 2100) Last BM Date: 01/07/17  Intake/Output from previous day: 11/29 0701 - 11/30 0700 In: -  Out: 850 [Urine:750; Drains:100] Intake/Output this shift: No intake/output data recorded.  PE: Gen:  Alert, NAD, pleasant, cooperative, eating breakfast Card:  RRR, no M/G/R heard Pulm:  rate and effort normal Abd: Soft, not distended, vac in place, +BS, mild generalized TTP, no guarding Skin: no rashes noted, warm and dry Neuro: A&O x  3   Anti-infectives: Anti-infectives (From admission, onward)   Start     Dose/Rate Route Frequency Ordered Stop   01/03/17 1000  rifaximin (XIFAXAN) tablet 550 mg     550 mg Oral 2 times daily 01/03/17 0939     01/02/17 1100  rifaximin (XIFAXAN) tablet 200 mg  Status:  Discontinued     200 mg Oral 2 times daily 01/02/17 1055 01/03/17 0939   12/30/16 2300  vancomycin (VANCOCIN) IVPB 750 mg/150 ml premix  Status:  Discontinued     750 mg 150 mL/hr over 60 Minutes Intravenous Every 12 hours 12/30/16 1235 01/01/17 1251   12/30/16 1300  piperacillin-tazobactam (ZOSYN) IVPB 3.375 g  Status:  Discontinued     3.375 g 12.5 mL/hr over 240 Minutes Intravenous Every 8 hours 12/30/16 1231 01/05/17 0210   12/30/16 1000  vancomycin (VANCOCIN) IVPB 750 mg/150 ml premix  Status:  Discontinued     750 mg 150 mL/hr over 60 Minutes Intravenous Every 12 hours 12/30/16 0125 12/30/16 1233   12/30/16 0015  vancomycin (VANCOCIN) IVPB 1000 mg/200 mL premix     1,000 mg 200 mL/hr over 60 Minutes Intravenous  Once 12/30/16 0001 12/30/16 0503      Lab Results:  Recent Labs    01/06/17 0457  WBC 5.6  HGB 10.6*  HCT 34.6*  PLT 156   BMET Recent Labs    01/06/17 0457 01/07/17 0414  NA 137 138  K 3.2* 3.7  CL 103 108  CO2 28 24  GLUCOSE 103* 104*  BUN <5* <5*  CREATININE 0.47  0.49  CALCIUM 8.8* 8.8*   PT/INR No results for input(s): LABPROT, INR in the last 72 hours. CMP     Component Value Date/Time   NA 138 01/07/2017 0414   K 3.7 01/07/2017 0414   CL 108 01/07/2017 0414   CO2 24 01/07/2017 0414   GLUCOSE 104 (H) 01/07/2017 0414   BUN <5 (L) 01/07/2017 0414   CREATININE 0.49 01/07/2017 0414   CREATININE 0.37 (L) 12/30/2015 1425   CALCIUM 8.8 (L) 01/07/2017 0414   PROT 5.9 (L) 01/03/2017 0322   ALBUMIN 2.7 (L) 01/03/2017 0322   AST 19 01/03/2017 0322   ALT 13 (L) 01/03/2017 0322   ALKPHOS 84 01/03/2017 0322   BILITOT 1.2 01/03/2017 0322   GFRNONAA >60 01/07/2017 0414   GFRNONAA  >89 12/30/2015 1425   GFRAA >60 01/07/2017 0414   GFRAA >89 12/30/2015 1425   Lipase     Component Value Date/Time   LIPASE 35 12/29/2016 2117    Studies/Results: No results found.    Jerre SimonJessica L Juelz Claar , [facility] 01/07/2017, 8:58 AM Pager: [phone] Consults: [phone] Mon-Fri 7:00 am-4:30 pm Sat-Sun 7:00 am-11:30 am

## 2017-01-07 NOTE — Progress Notes (Signed)
Chaplain came to Anne Black's room by way of a request for prayer. The presenting life crisis and pastoral care / spiritual need is for Anne Black to be present with and honest about the devastation of her addiction to alcohol in her life.  We together sat in a meaningful time of sharing that centered around her desire to be sober. The language of Recovery, of the Twelve Steps is known by and is important to Anne Black. This is the point of connection, and the window to come through to really make contact with the patient's mind and heart.  Anne Black is aware that there can be no real hope of having a good life in these years she has left without walking and working the path to recovery with ABSOLUTE Honesty Openness and Willingness. Patient said that she has not had a drink since 3 November. If she can hold on through Monday, December 3 she will have 30 days. This something to be celebrated. Addiction is a disease of feelings - Patient surely needs a strong therapeutic alliance where she can lean in and explore the issues at the foundation of her drinking.

## 2017-01-07 NOTE — Discharge Instructions (Signed)
[facility]      [facility] [place] [phone]  OPEN ABDOMINAL SURGERY: POST OP INSTRUCTIONS  Always review your discharge instruction sheet given to you by the facility where your surgery was performed.  IF YOU HAVE DISABILITY OR FAMILY LEAVE FORMS, YOU MUST BRING THEM TO THE OFFICE FOR PROCESSING.  PLEASE DO NOT GIVE THEM TO YOUR DOCTOR.  1. A prescription for pain medication may be given to you upon discharge.  Take your pain medication as prescribed, if needed.  If narcotic pain medicine is not needed, then you may take acetaminophen (Tylenol) or ibuprofen (Advil) as needed. 2. Take your usually prescribed medications unless otherwise directed. 3. If you need a refill on your pain medication, please contact your pharmacy. They will contact our office to request authorization.  Prescriptions will not be filled after 5pm or on week-ends. 4. You should follow a light diet the first few days after arrival home, such as soup and crackers, pudding, etc.unless your doctor has advised otherwise. A high-fiber, low fat diet can be resumed as tolerated.   Be sure to include lots of fluids daily. Most patients will experience some swelling and bruising on the chest and neck area.  Ice packs will help.  Swelling and bruising can take several days to resolve 5. Most patients will experience some swelling and bruising in the area of the incision. Ice pack will help. Swelling and bruising can take several days to resolve..  6. It is common to experience some constipation if taking pain medication after surgery.  Increasing fluid intake and taking a stool softener will usually help or prevent this problem from occurring.  A mild laxative (Milk of Magnesia or Miralax) should be taken according to package directions if there are no bowel movements after 48 hours. 7.  You may have steri-strips (small skin tapes) in place directly over the incision.  These strips should be left on the skin for 7-10 days.  If your  surgeon used skin glue on the incision, you may shower in 24 hours.  The glue will flake off over the next 2-3 weeks.  Any sutures or staples will be removed at the office during your follow-up visit. You may find that a light gauze bandage over your incision may keep your staples from being rubbed or pulled. You may shower and replace the bandage daily. 8. ACTIVITIES:  You may resume regular (light) daily activities beginning the next day--such as daily self-care, walking, climbing stairs--gradually increasing activities as tolerated.  You may have sexual intercourse when it is comfortable.  Refrain from any heavy lifting or straining until approved by your doctor. a. You may drive when you no longer are taking prescription pain medication, you can comfortably wear a seatbelt, and you can safely maneuver your car and apply brakes b. Return to Work: not until approved by Dr. [place] 9. You should see your doctor in the office for a follow-up appointment approximately two weeks after your surgery.  Make sure that you call for this appointment within a day or two after you arrive home to insure a convenient appointment time. OTHER INSTRUCTIONS:  _____________________________________________________________ _____________________________________________________________  WHEN TO CALL YOUR DOCTOR: 1. Fever over 101.0 2. Inability to urinate 3. Nausea and/or vomiting 4. Extreme swelling or bruising 5. Continued bleeding from incision. 6. Increased pain, redness, or drainage from the incision. 7. Difficulty swallowing or breathing 8. Muscle cramping or spasms. 9. Numbness or tingling in hands or feet or around lips.  The  clinic staff is available to answer your questions during regular business hours.  Please dont hesitate to call and ask to speak to one of the nurses if you have concerns.  For further questions, please visit [email or web address]

## 2017-01-07 NOTE — Social Work (Addendum)
CSW met with pt at bedside, pt still desires to go to [facility], was informed of offer at [facility]  Would like to meet with liason from [place] if possible, says [facility] is "too far."    CSW will continue to follow up with pt and [employer] as pt prepares for discharge. PASSR still pending.    1:00 pm-PASSR approved, [facility] does not have bed placement. CSW exploring other options with pt, specifically [facility] and [facility].   Alexander Mt, [place] Work [phone]

## 2017-01-07 NOTE — Progress Notes (Signed)
PROGRESS NOTE  Anne ApplebaumCarla S Black  ZOX:[id number] DOB: 07-20-1960 DOA: 12/29/2016 PCP: Anne Black  Brief Narrative: 56 y.o. female with long-standing history of alcohol abuse and underlying liver cirrhosis with history of ascites and paracentesis. Patient also [facility] as well as bipolar disorder. Presented yesterday to hospital with 2 day history of body cramps. Also endorses generalized malaise, fatigue, decreased appetite, and nausea. Patient endorsed drinking 4-5 glasses of wine daily on average with last alcoholic beverage on 11/20. Abdominal CT imaging in the emergency department on 11/21 showed a ventral wall hernia with surrounding edema. Patient endorsed new purulent drainage from open wound and surrounding erythema from abdominal wall hernia. On 11/22, she was taken to the OR for laparotomy, partial omentectomy, primary umbilical hernia repair with sutures and negative pressure dressing. CCM assumed care. The patient was extubated 11/24 and required precedex infusion for alcohol withdrawal which was weaned.  Assessment & Plan: Principal Problem:   Abdominal wall cellulitis Active Problems:   Hypokalemia   Alcohol use disorder, severe, dependence (HCC)   Alcoholic cirrhosis of liver with ascites (HCC)   Ventral hernia without obstruction or gangrene   S/P exploratory laparotomy  Sepsis due to ventral hernia with incarceration s/p laparotomy with hernia repair: Resolved sepsis. Completed zosyn. - Wound care MWF w/vac Black surgery - Optimize nutrition, solid diet Black surgery  Hepatic cirrhosis with ascites and varices:  - Continue rifaximin and lactulose, not currently encephalopathic - Restarted lasix, spironolactone 11/28.  GERD:  - Continue PPI, add carafate  Acute encephalopathy: Related to alcohol withdrawal, acute illness/delirium, and hepatic encephalopathy.  - Resolved. DC sitter.  Paroxysmal atrial fibrillation: Has converted to  NSR, responsive to beta blocker, suspect she' susceptible to hypokalemia/hypomagnesemia.  - Continue metoprolol prn - Monitor electrolytes, maintain K > 4 and Mg > 2. Replace today.  - Continue heart monitoring while inpatient.  - Recommend cardiology follow up. Consider starting aspirin (CHA2DS2-VAScis 1), defer to surgery.  Acute hypoxic and hypercarbic respiratory failure: Multifactorial in setting of acute illness, tobacco use disorder contributing.  - Resolving - Continue duonebs, IS, OOB  Multiple sclerosis:  - Follow up at [facility]  Bipolar disorder/ADD: Appears stable.  - Restarted home medications prozac, buspar, risperidone, neurontin, topiramate, modafanil.   Anemia: Mild. No signs of active bleeding. - Monitor  Thrombocytopenia: Resolved. Likely secondary to consumption. - Monitor intermittently, continue DVT ppx  DVT prophylaxis: Lovenox Code Status: Full Family Communication: None at bedside Disposition Plan: Stable for DC to SNF. Discussed with social work, signed 30-day note. Awaiting PASRR.  Consultants:   CCM  Surgery  Procedures:  11/22 - Ex Lap for incarcerated omental hernia w/ wound vac placement >> returned to ICU on vent 11/24 - BiPAP overnight for hypercarbia & altered mentation. Taken off of BiPAP in AM. Altered mentation w/ likely EtOH withdrawal >> started Precedex gtt.  Antimicrobials:  Vancomycin 11/22 - 11/24  Zosyn 11/22 - 11/28   Subjective: Abdominal pain much better with carafate. No other issues. No agitation in past 24 hours.  Objective: Vitals:   01/07/17 0428 01/07/17 0900 01/07/17 0945 01/07/17 1227  BP: (!) 96/48 106/75    Pulse: 77 79    Resp: 16 (!) 22    Temp: 98.2 F (36.8 C) 98 F (36.7 C)    TempSrc: Oral Oral    SpO2: 99% 98% 98% 93%  Weight:      Height:        Intake/Output Summary (  Last 24 hours) at 01/07/2017 1300 Last data filed at 01/07/2017 1257 Gross Black 24 hour  Intake -  Output 1150 ml  Net -1150  ml   Filed Weights   12/30/16 1200 12/31/16 0603 01/05/17 0154  Weight: 67.9 kg (149 lb 11.1 oz) 78 kg (171 lb 15.3 oz) 80.6 kg (177 lb 11.1 oz)   Gen: Chronically ill-appearing 56 y.o. female in no distress Pulm: Non-labored breathing. Mild basilar crackles clear with cough. CV: Regular rate and rhythm. No murmur, rub, or gallop. No JVD, no pedal edema. GI: Abdomen soft, appropriately tender with midline incisional wound with vac in place without significant surrounding erythema. No rebound or guarding. Ext: Warm, no deformities Skin: No rashes, lesions no ulcers Neuro: Alert and oriented. No focal neurological deficits. Psych: Judgement and insight appear fair. Mood & affect appropriate, stable. No SI/HI, not hallucinating and not responding to internal stimuli.   CBC: Recent Labs  Lab 01/01/17 0223 01/02/17 0323 01/03/17 0322 01/04/17 0308 01/06/17 0457  WBC 9.1 7.1 5.5 4.5 5.6  NEUTROABS 7.3 5.2 3.8  --   --   HGB 9.6* 9.7* 9.9* 9.0* 10.6*  HCT 32.0* 32.3* 33.7* 29.7* 34.6*  MCV 95.2 96.1 94.7 92.5 90.8  PLT 131* 142* 128* 131* 156   Basic Metabolic Panel: Recent Labs  Lab 01/01/17 0223  01/02/17 0323 01/03/17 0322 01/04/17 0308 01/04/17 1504 01/05/17 0649 01/06/17 0457 01/07/17 0414  NA 139   < > 135 140 137 139 138 137 138  K 2.7*   < > 3.5 3.3* 2.8* 3.0* 3.6 3.2* 3.7  CL 100*   < > 98* 104 101 100* 105 103 108  CO2 33*   < > 30 29 29  33* 28 28 24   GLUCOSE 105*   < > 106* 96 95 130* 102* 103* 104*  BUN <5*   < > 6 5* <5* <5* <5* <5* <5*  CREATININE 0.47   < > 0.44 0.50 0.38* 0.42* 0.39* 0.47 0.49  CALCIUM 8.0*   < > 8.2* 8.2* 7.9* 8.2* 8.4* 8.8* 8.8*  MG 1.5*  --  2.0 1.5*  --  2.0 1.8 1.6* 1.8  PHOS 3.4  --  2.5 3.0  --   --   --   --   --    < > = values in this interval not displayed.   GFR: Estimated Creatinine Clearance: 76.3 mL/min (by C-G formula based on SCr of 0.49 mg/dL). Liver Function Tests: Recent Labs  Lab 01/01/17 0223 01/02/17 0323  01/03/17 0322  AST  --   --  19  ALT  --   --  13*  ALKPHOS  --   --  84  BILITOT  --   --  1.2  PROT  --   --  5.9*  ALBUMIN 3.1* 2.8* 2.7*   Recent Labs  Lab 01/02/17 1126  AMMONIA 68*   Coagulation Profile: No results for input(s): INR, PROTIME in the last 168 hours. Urine analysis:    Component Value Date/Time   COLORURINE STRAW (A) 12/29/2016 2130   APPEARANCEUR CLEAR 12/29/2016 2130   LABSPEC 1.005 12/29/2016 2130   PHURINE 7.0 12/29/2016 2130   GLUCOSEU NEGATIVE 12/29/2016 2130   HGBUR NEGATIVE 12/29/2016 2130   BILIRUBINUR NEGATIVE 12/29/2016 2130   BILIRUBINUR moderate (A) 06/19/2015 1906   KETONESUR NEGATIVE 12/29/2016 2130   PROTEINUR NEGATIVE 12/29/2016 2130   UROBILINOGEN >=8.0 06/19/2015 1906   UROBILINOGEN 0.2 09/14/2011 2141   NITRITE NEGATIVE 12/29/2016 2130  LEUKOCYTESUR NEGATIVE 12/29/2016 2130   Recent Results (from the past 240 hour(s))  Culture, blood (routine x 2)     Status: None   Collection Time: 12/30/16  1:30 AM  Result Value Ref Range Status   Specimen Description BLOOD RIGHT HAND  Final   Special Requests   Final    BOTTLES DRAWN AEROBIC AND ANAEROBIC Blood Culture adequate volume   Culture NO GROWTH 5 DAYS  Final   Report Status 01/04/2017 FINAL  Final  Culture, blood (routine x 2)     Status: None   Collection Time: 12/30/16  2:45 AM  Result Value Ref Range Status   Specimen Description BLOOD RIGHT FOREARM  Final   Special Requests   Final    BOTTLES DRAWN AEROBIC AND ANAEROBIC Blood Culture adequate volume   Culture NO GROWTH 5 DAYS  Final   Report Status 01/04/2017 FINAL  Final  MRSA PCR Screening     Status: None   Collection Time: 12/30/16 12:27 PM  Result Value Ref Range Status   MRSA by PCR NEGATIVE NEGATIVE Final    Comment:        The GeneXpert MRSA Assay (FDA approved for NASAL specimens only), is one component of a comprehensive MRSA colonization surveillance program. It is not intended to diagnose MRSA infection  nor to guide or monitor treatment for MRSA infections.       Radiology Studies: No results found.  Scheduled Meds: . busPIRone  15 mg Oral BID  . enoxaparin (LOVENOX) injection  40 mg Subcutaneous Q24H  . FLUoxetine  20 mg Oral Daily  . folic acid  1 mg Oral Daily  . furosemide  40 mg Oral Daily  . gabapentin  400 mg Oral TID  . insulin aspart  0-5 Units Subcutaneous QHS  . insulin aspart  0-9 Units Subcutaneous TID WC  . ipratropium-albuterol  3 mL Nebulization Q4H  . lactulose  20 g Oral BID  . modafinil  200 mg Oral Daily  . mometasone-formoterol  2 puff Inhalation BID  . multivitamin with minerals  1 tablet Oral Daily  . nicotine  14 mg Transdermal Daily  . pantoprazole  40 mg Oral Q1200  . rifaximin  550 mg Oral BID  . risperiDONE  0.5 mg Oral BID  . spironolactone  100 mg Oral Daily  . sucralfate  1 g Oral TID WC & HS  . thiamine  100 mg Oral Daily  . topiramate  100 mg Oral Daily   Continuous Infusions: . sodium chloride 10 mL/hr at 01/03/17 0947     LOS: 8 days   Time spent: 25 minutes.  Hazeline Junker, MD [facility] Pager [phone]  If 7PM-7AM, please contact night-coverage [email or web address] Password [email or web address] 01/07/2017, 1:00 PM

## 2017-01-08 LAB — BASIC METABOLIC PANEL
ANION GAP: 7 (ref 5–15)
CO2: 26 mmol/L (ref 22–32)
Calcium: 8.7 mg/dL — ABNORMAL LOW (ref 8.9–10.3)
Chloride: 105 mmol/L (ref 101–111)
Creatinine, Ser: 0.4 mg/dL — ABNORMAL LOW (ref 0.44–1.00)
GFR calc Af Amer: >60 mL/min (ref >60)
GLUCOSE: 101 mg/dL — AB (ref 65–99)
POTASSIUM: 3.2 mmol/L — AB (ref 3.5–5.1)
Sodium: 138 mmol/L (ref 135–145)

## 2017-01-08 LAB — GLUCOSE, CAPILLARY
GLUCOSE-CAPILLARY: 99 mg/dL (ref 65–99)
Glucose-Capillary: 103 mg/dL — ABNORMAL HIGH (ref 65–99)
Glucose-Capillary: 110 mg/dL — ABNORMAL HIGH (ref 65–99)

## 2017-01-08 MED ORDER — LISDEXAMFETAMINE DIMESYLATE 10 MG PO CAPS: 10.0000 mg | ORAL_CAPSULE | ORAL | Status: DC

## 2017-01-08 MED ORDER — POTASSIUM CHLORIDE CRYS ER 20 MEQ PO TBCR
40.0000 meq | EXTENDED_RELEASE_TABLET | Freq: Once | ORAL | Status: AC
  Administered 2017-01-08: 40 meq via ORAL
  Filled 2017-01-08: qty 2

## 2017-01-08 MED ORDER — MODAFINIL 100 MG PO TABS
200.0000 mg | ORAL_TABLET | Freq: Two times a day (BID) | ORAL | Status: DC
  Administered 2017-01-08 – 2017-01-10 (×4): 200 mg via ORAL
  Filled 2017-01-08 (×6): qty 2

## 2017-01-08 MED ORDER — LISDEXAMFETAMINE DIMESYLATE 20 MG PO CAPS
20.0000 mg | ORAL_CAPSULE | ORAL | Status: DC
  Administered 2017-01-09 – 2017-01-11 (×3): 20 mg via ORAL
  Filled 2017-01-08 (×4): qty 1

## 2017-01-08 NOTE — Progress Notes (Signed)
PROGRESS NOTE  Anne Black  FAO:[id number] DOB: 10-16-1960 DOA: 12/29/2016 PCP: Patient, No Pcp Per  Brief Narrative: 56 y.o. female with long-standing history of alcohol abuse and underlying liver cirrhosis with history of ascites and paracentesis. Patient also has known multiple sclerosis followed at [facility] as well as bipolar disorder. Presented yesterday to hospital with 2 day history of body cramps. Also endorses generalized malaise, fatigue, decreased appetite, and nausea. Patient endorsed drinking 4-5 glasses of wine daily on average with last alcoholic beverage on 11/20. Abdominal CT imaging in the emergency department on 11/21 showed a ventral wall hernia with surrounding edema. Patient endorsed new purulent drainage from open wound and surrounding erythema from abdominal wall hernia. On 11/22, she was taken to the OR for laparotomy, partial omentectomy, primary umbilical hernia repair with sutures and negative pressure dressing. CCM assumed care. The patient was extubated 11/24 and required precedex infusion for alcohol withdrawal which was weaned.  Assessment & Plan: Principal Problem:   Abdominal wall cellulitis Active Problems:   Hypokalemia   Alcohol use disorder, severe, dependence (HCC)   Alcoholic cirrhosis of liver with ascites (HCC)   Ventral hernia without obstruction or gangrene   S/P exploratory laparotomy  Sepsis due to ventral hernia with incarceration s/p laparotomy with hernia repair: Resolved sepsis. Completed zosyn. - Wound care MWF w/vac per surgery - Optimize nutrition, solid diet per surgery  Hepatic cirrhosis with ascites and varices:   - Continue rifaximin, ok to hold lactulose (was newly added) - Restarted lasix, spironolactone 11/28. metabolic panel stable.  GERD:  - Continue PPI, added carafate with improvement.  Acute encephalopathy: Related to alcohol withdrawal, acute illness/delirium, and hepatic encephalopathy.  - Resolved.    Paroxysmal atrial  fibrillation: Has converted to NSR, responsive to beta blocker, suspect she' susceptible to hypokalemia/hypomagnesemia.  - Continue metoprolol prn - Monitor electrolytes, maintain K > 4 and Mg > 2. Replace today.  - Continue heart monitoring while inpatient.  - Recommend cardiology follow up. Consider starting aspirin (CHA2DS2-VAScis 1), defer to surgery.  Acute hypoxic and hypercarbic respiratory failure: Multifactorial in setting of acute illness, tobacco use disorder contributing.  - Resolving - Continue duonebs, IS, OOB - Telemetry reviewed, no significant arrhythmias, will DC today.   Multiple sclerosis: No acute exacerbation evident - Follow up at [facility]  Bipolar disorder/ADD: Appears stable.  - Restarted home medications prozac, buspar, risperidone, neurontin, topiramate, modafanil. Will restart very low dose vyvanse as well.   Alcoholism: Last drink 11/3, interested in support groups for assistance quitting. Would be 30 days sober if continues until 12/3.  - Will need to attend alcoholics anonymous meetings at discharge for best chance at long term sobriety.   Anemia: Mild. No signs of active bleeding. - Monitor  Thrombocytopenia: Resolved. Likely secondary to consumption. - Monitor intermittently, continue DVT ppx  Hypokalemia:  - Replaced in AM, recheck tmrw.   DVT prophylaxis: Lovenox Code Status: Full Family Communication: None at bedside Disposition Plan: Stable for DC to SNF. Discussed with social work, signed 30-day note. Awaiting insurance authorization (likely DC 12/3).  Consultants:   CCM  Surgery  Procedures:  11/22 - Ex Lap for incarcerated omental hernia w/ wound vac placement >> returned to ICU on vent 11/24 - BiPAP overnight for hypercarbia & altered mentation. Taken off of BiPAP in AM. Altered mentation w/ likely EtOH withdrawal >> started Precedex gtt.  Antimicrobials:  Vancomycin 11/22 - 11/24  Zosyn 11/22 - 11/28   Subjective: Abdominal  pain improved, wants to  be chagned to regular diet, decrease lactulose, restart vyvanse (takes 70mg  daily).   Objective: Vitals:   01/08/17 0620 01/08/17 0907 01/08/17 1239 01/08/17 1526  BP: (!) 116/51   90/68  Pulse: 68   (!) 135  Resp: 20   20  Temp: 98.6 F (37 C)   98.5 F (36.9 C)  TempSrc:    Oral  SpO2: 95% 97% 97% 97%  Weight:      Height:        Intake/Output Summary (Last 24 hours) at 01/08/2017 1557 Last data filed at 01/08/2017 1139 Gross per 24 hour  Intake -  Output 1650 ml  Net -1650 ml   Filed Weights   12/30/16 1200 12/31/16 0603 01/05/17 0154  Weight: 67.9 kg (149 lb 11.1 oz) 78 kg (171 lb 15.3 oz) 80.6 kg (177 lb 11.1 oz)   Gen: Pleasant, chronically ill-appearing 56 y.o. female in no distress Pulm: Non-labored breathing. No wheezes, crackles, rhonchi. CV: Regular rate and rhythm. No murmur, rub, or gallop. No JVD, no pedal edema. GI: Abdomen soft, appropriately tender with midline incisional wound with vac in place without significant surrounding erythema. No distention, rebound or guarding. Ext: Warm, no deformities Skin: No rashes, lesions no ulcers Neuro: Alert and oriented. No focal neurological deficits. Psych: Judgement and insight appear fair. Mood & affect appropriate, stable. No SI/HI, not hallucinating and not responding to internal stimuli.   CBC: Recent Labs  Lab 01/02/17 0323 01/03/17 0322 01/04/17 0308 01/06/17 0457  WBC 7.1 5.5 4.5 5.6  NEUTROABS 5.2 3.8  --   --   HGB 9.7* 9.9* 9.0* 10.6*  HCT 32.3* 33.7* 29.7* 34.6*  MCV 96.1 94.7 92.5 90.8  PLT 142* 128* 131* 156   Basic Metabolic Panel: Recent Labs  Lab 01/02/17 0323 01/03/17 0322  01/04/17 1504 01/05/17 0649 01/06/17 0457 01/07/17 0414 01/08/17 0455  NA 135 140   < > 139 138 137 138 138  K 3.5 3.3*   < > 3.0* 3.6 3.2* 3.7 3.2*  CL 98* 104   < > 100* 105 103 108 105  CO2 30 29   < > 33* 28 28 24 26   GLUCOSE 106* 96   < > 130* 102* 103* 104* 101*  BUN 6 5*   < >  <5* <5* <5* <5* <5*  CREATININE 0.44 0.50   < > 0.42* 0.39* 0.47 0.49 0.40*  CALCIUM 8.2* 8.2*   < > 8.2* 8.4* 8.8* 8.8* 8.7*  MG 2.0 1.5*  --  2.0 1.8 1.6* 1.8  --   PHOS 2.5 3.0  --   --   --   --   --   --    < > = values in this interval not displayed.   GFR: Estimated Creatinine Clearance: 76.3 mL/min (A) (by C-G formula based on SCr of 0.4 mg/dL (L)). Liver Function Tests: Recent Labs  Lab 01/02/17 0323 01/03/17 0322  AST  --  19  ALT  --  13*  ALKPHOS  --  84  BILITOT  --  1.2  PROT  --  5.9*  ALBUMIN 2.8* 2.7*   Recent Labs  Lab 01/02/17 1126  AMMONIA 68*   Coagulation Profile: No results for input(s): INR, PROTIME in the last 168 hours. Urine analysis:    Component Value Date/Time   COLORURINE STRAW (A) 12/29/2016 2130   APPEARANCEUR CLEAR 12/29/2016 2130   LABSPEC 1.005 12/29/2016 2130   PHURINE 7.0 12/29/2016 2130   GLUCOSEU NEGATIVE 12/29/2016  2130   HGBUR NEGATIVE 12/29/2016 2130   BILIRUBINUR NEGATIVE 12/29/2016 2130   BILIRUBINUR moderate (A) 06/19/2015 1906   KETONESUR NEGATIVE 12/29/2016 2130   PROTEINUR NEGATIVE 12/29/2016 2130   UROBILINOGEN >=8.0 06/19/2015 1906   UROBILINOGEN 0.2 09/14/2011 2141   NITRITE NEGATIVE 12/29/2016 2130   LEUKOCYTESUR NEGATIVE 12/29/2016 2130   Recent Results (from the past 240 hour(s))  Culture, blood (routine x 2)     Status: None   Collection Time: 12/30/16  1:30 AM  Result Value Ref Range Status   Specimen Description BLOOD RIGHT HAND  Final   Special Requests   Final    BOTTLES DRAWN AEROBIC AND ANAEROBIC Blood Culture adequate volume   Culture NO GROWTH 5 DAYS  Final   Report Status 01/04/2017 FINAL  Final  Culture, blood (routine x 2)     Status: None   Collection Time: 12/30/16  2:45 AM  Result Value Ref Range Status   Specimen Description BLOOD RIGHT FOREARM  Final   Special Requests   Final    BOTTLES DRAWN AEROBIC AND ANAEROBIC Blood Culture adequate volume   Culture NO GROWTH 5 DAYS  Final    Report Status 01/04/2017 FINAL  Final  MRSA PCR Screening     Status: None   Collection Time: 12/30/16 12:27 PM  Result Value Ref Range Status   MRSA by PCR NEGATIVE NEGATIVE Final    Comment:        The GeneXpert MRSA Assay (FDA approved for NASAL specimens only), is one component of a comprehensive MRSA colonization surveillance program. It is not intended to diagnose MRSA infection nor to guide or monitor treatment for MRSA infections.       Radiology Studies: No results found.  Scheduled Meds: . busPIRone  15 mg Oral BID  . enoxaparin (LOVENOX) injection  40 mg Subcutaneous Q24H  . FLUoxetine  20 mg Oral Daily  . folic acid  1 mg Oral Daily  . furosemide  40 mg Oral Daily  . gabapentin  400 mg Oral TID  . insulin aspart  0-5 Units Subcutaneous QHS  . insulin aspart  0-9 Units Subcutaneous TID WC  . ipratropium-albuterol  3 mL Nebulization Q6H  . lactulose  20 g Oral BID  . modafinil  200 mg Oral Daily  . mometasone-formoterol  2 puff Inhalation BID  . multivitamin with minerals  1 tablet Oral Daily  . nicotine  14 mg Transdermal Daily  . pantoprazole  40 mg Oral Q1200  . rifaximin  550 mg Oral BID  . risperiDONE  0.5 mg Oral BID  . spironolactone  100 mg Oral Daily  . sucralfate  1 g Oral TID WC & HS  . thiamine  100 mg Oral Daily  . topiramate  100 mg Oral Daily   Continuous Infusions: . sodium chloride 10 mL/hr at 01/03/17 0947     LOS: 9 days   Time spent: 25 minutes.  Hazeline Junker, MD [facility] Pager [phone]  If 7PM-7AM, please contact night-coverage [email or web address] Password [facility] 01/08/2017, 3:57 PM

## 2017-01-09 LAB — BASIC METABOLIC PANEL
ANION GAP: 8 (ref 5–15)
BUN: 7 mg/dL (ref 6–20)
CALCIUM: 8.6 mg/dL — AB (ref 8.9–10.3)
CO2: 22 mmol/L (ref 22–32)
Chloride: 107 mmol/L (ref 101–111)
Creatinine, Ser: 0.53 mg/dL (ref 0.44–1.00)
GLUCOSE: 107 mg/dL — AB (ref 65–99)
POTASSIUM: 3.4 mmol/L — AB (ref 3.5–5.1)
Sodium: 137 mmol/L (ref 135–145)

## 2017-01-09 MED ORDER — OXYCODONE HCL 5 MG PO TABS
5.0000 mg | ORAL_TABLET | ORAL | Status: DC | PRN
  Filled 2017-01-09: qty 1

## 2017-01-09 MED ORDER — CAMPHOR-MENTHOL 0.5-0.5 % EX LOTN
TOPICAL_LOTION | CUTANEOUS | Status: DC | PRN
  Administered 2017-01-09: 15:00:00 via TOPICAL
  Filled 2017-01-09: qty 222

## 2017-01-09 MED ORDER — DIPHENHYDRAMINE HCL 25 MG PO CAPS
25.0000 mg | ORAL_CAPSULE | Freq: Every evening | ORAL | Status: DC | PRN
  Administered 2017-01-09 – 2017-01-11 (×3): 25 mg via ORAL
  Filled 2017-01-09 (×3): qty 1

## 2017-01-09 MED ORDER — IPRATROPIUM-ALBUTEROL 0.5-2.5 (3) MG/3ML IN SOLN
3.0000 mL | Freq: Four times a day (QID) | RESPIRATORY_TRACT | Status: DC | PRN
  Administered 2017-01-10: 3 mL via RESPIRATORY_TRACT
  Filled 2017-01-09: qty 3

## 2017-01-09 MED ORDER — LORAZEPAM 1 MG PO TABS
1.0000 mg | ORAL_TABLET | Freq: Four times a day (QID) | ORAL | Status: DC | PRN
  Administered 2017-01-09 – 2017-01-10 (×2): 1 mg via ORAL
  Filled 2017-01-09 (×3): qty 1

## 2017-01-09 MED ORDER — POTASSIUM CHLORIDE CRYS ER 20 MEQ PO TBCR
20.0000 meq | EXTENDED_RELEASE_TABLET | Freq: Once | ORAL | Status: DC
  Filled 2017-01-09 (×2): qty 1

## 2017-01-09 MED ORDER — TRIAMCINOLONE ACETONIDE 0.5 % EX CREA
TOPICAL_CREAM | Freq: Three times a day (TID) | CUTANEOUS | Status: DC
  Administered 2017-01-09 – 2017-01-11 (×4): via TOPICAL
  Filled 2017-01-09: qty 15

## 2017-01-09 NOTE — Progress Notes (Signed)
PROGRESS NOTE  Anne Black  WUJ:[id number] DOB: 1961-01-21 DOA: 12/29/2016   PCP: Patient, No Pcp Per   Brief Narrative: 56 y.o. female with long-standing history of alcohol abuse and underlying liver cirrhosis with history of ascites and paracentesis. Patient also has known multiple sclerosis followed at [facility] as well as bipolar disorder. Presented yesterday to hospital with 2 day history of body cramps. Also endorses generalized malaise, fatigue, decreased appetite, and nausea. Patient endorsed drinking 4-5 glasses of wine daily on average with last alcoholic beverage on 11/20. Abdominal CT imaging in the emergency department on 11/21 showed a ventral wall hernia with surrounding edema. Patient endorsed new purulent drainage from open wound and surrounding erythema from abdominal wall hernia. On 11/22, she was taken to the OR for laparotomy, partial omentectomy, primary umbilical hernia repair with sutures and negative pressure dressing. CCM assumed care. The patient was extubated 11/24 and required precedex infusion for alcohol withdrawal which was weaned.  Assessment & Plan: Principal Problem:   Abdominal wall cellulitis Active Problems:   Hypokalemia   Alcohol use disorder, severe, dependence (HCC)   Alcoholic cirrhosis of liver with ascites (HCC)   Ventral hernia without obstruction or gangrene   S/P exploratory laparotomy  Sepsis due to ventral hernia with incarceration s/p laparotomy with hernia repair: Resolved sepsis. Completed zosyn. - wound care MWF, w/v per surgery  - Optimize nutrition, solid diet per surgery  Hepatic cirrhosis with ascites and varices:   - Continue rifaximin, ok to hold lactulose (was newly added) - Restarted lasix, spironolactone 11/28 - metabolic panel stab;e - sarna lotion for itching   GERD:  - continue PPI  Acute encephalopathy: Related to alcohol withdrawal, acute illness/delirium, and hepatic encephalopathy.  - resolved     Paroxysmal atrial  fibrillation: Has converted to NSR, responsive to beta blocker, suspect she' susceptible to hypokalemia/hypomagnesemia.  - CHADS2 vasc 1, cardiology follow up recommended  - Continue metoprolol prn - Monitor electrolytes, maintain K > 4 and Mg > 2. Replace today.  - tele d/c on 12/01  Acute hypoxic and hypercarbic respiratory failure: Multifactorial in setting of acute illness, tobacco use disorder contributing.  - Resolving - Continue duonebs, IS, OOB - Telemetry reviewed, no significant arrhythmias, will DC today.   Multiple sclerosis: No acute exacerbation evident - follows at [facility]  Bipolar disorder/ADD: Appears stable.  - Restarted home medications prozac, buspar, risperidone, neurontin, topiramate, modafanil. Will restart very low dose vyvanse as well.   Alcoholism: Last drink 11/3, interested in support groups for assistance quitting. Would be 30 days sober if continues until 12/3.  - Will need to attend alcoholics anonymous meetings at discharge for best chance at long term sobriety.   Anemia: Mild. No signs of active bleeding. - CBC in AM  Thrombocytopenia: Resolved. Likely secondary to consumption. - Monitor intermittently, continue DVT ppx  Hypokalemia:  - supplemented, BMP in AM  DVT prophylaxis: Lovenox Code Status: Full Family Communication: None at bedside Disposition Plan: Stable for DC to SNF. Discussed with social work, signed 30-day note. Awaiting insurance authorization (likely DC 12/4)  Consultants:   CCM  Surgery  Procedures:  11/22 - Ex Lap for incarcerated omental hernia w/ wound vac placement >> returned to ICU on vent 11/24 - BiPAP overnight for hypercarbia & altered mentation. Taken off of BiPAP in AM. Altered mentation w/ likely EtOH withdrawal >> started Precedex gtt.  Antimicrobials:  Vancomycin 11/22 - 11/24  Zosyn 11/22 - 11/28   Subjective: No events overnight.  Objective: Vitals:   01/08/17 2122 01/08/17 2124 01/09/17 0450  01/09/17 0859  BP:   (!) 94/55   Pulse:   77   Resp:   16   Temp:   98.4 F (36.9 C)   TempSrc:   Oral   SpO2: 96% 95% 95% 95%  Weight:      Height:        Intake/Output Summary (Last 24 hours) at 01/09/2017 1015 Last data filed at 01/09/2017 0546 Gross per 24 hour  Intake 120 ml  Output 1320 ml  Net -1200 ml   Filed Weights   12/30/16 1200 12/31/16 0603 01/05/17 0154  Weight: 67.9 kg (149 lb 11.1 oz) 78 kg (171 lb 15.3 oz) 80.6 kg (177 lb 11.1 oz)   Physical Exam  Constitutional: Appears calm, NAD CVS: RRR, S1/S2 +, no murmurs, no gallops, no carotid bruit.  Pulmonary: Effort and breath sounds normal, no stridor, rhonchi, wheezes, rales.  Abdominal: Soft. BS +,  no distension, tenderness, wound vac in place   CBC: Recent Labs  Lab 01/03/17 0322 01/04/17 0308 01/06/17 0457  WBC 5.5 4.5 5.6  NEUTROABS 3.8  --   --   HGB 9.9* 9.0* 10.6*  HCT 33.7* 29.7* 34.6*  MCV 94.7 92.5 90.8  PLT 128* 131* 156   Basic Metabolic Panel: Recent Labs  Lab 01/03/17 0322  01/04/17 1504 01/05/17 0649 01/06/17 0457 01/07/17 0414 01/08/17 0455 01/09/17 0454  NA 140   < > 139 138 137 138 138 137  K 3.3*   < > 3.0* 3.6 3.2* 3.7 3.2* 3.4*  CL 104   < > 100* 105 103 108 105 107  CO2 29   < > 33* 28 28 24 26 22   GLUCOSE 96   < > 130* 102* 103* 104* 101* 107*  BUN 5*   < > <5* <5* <5* <5* <5* 7  CREATININE 0.50   < > 0.42* 0.39* 0.47 0.49 0.40* 0.53  CALCIUM 8.2*   < > 8.2* 8.4* 8.8* 8.8* 8.7* 8.6*  MG 1.5*  --  2.0 1.8 1.6* 1.8  --   --   PHOS 3.0  --   --   --   --   --   --   --    < > = values in this interval not displayed.   Liver Function Tests: Recent Labs  Lab 01/03/17 0322  AST 19  ALT 13*  ALKPHOS 84  BILITOT 1.2  PROT 5.9*  ALBUMIN 2.7*   Recent Labs  Lab 01/02/17 1126  AMMONIA 68*   Urine analysis:    Component Value Date/Time   COLORURINE STRAW (A) 12/29/2016 2130   APPEARANCEUR CLEAR 12/29/2016 2130   LABSPEC 1.005 12/29/2016 2130   PHURINE 7.0  12/29/2016 2130   GLUCOSEU NEGATIVE 12/29/2016 2130   HGBUR NEGATIVE 12/29/2016 2130   BILIRUBINUR NEGATIVE 12/29/2016 2130   BILIRUBINUR moderate (A) 06/19/2015 1906   KETONESUR NEGATIVE 12/29/2016 2130   PROTEINUR NEGATIVE 12/29/2016 2130   UROBILINOGEN >=8.0 06/19/2015 1906   UROBILINOGEN 0.2 09/14/2011 2141   NITRITE NEGATIVE 12/29/2016 2130   LEUKOCYTESUR NEGATIVE 12/29/2016 2130   Recent Results (from the past 240 hour(s))  MRSA PCR Screening     Status: None   Collection Time: 12/30/16 12:27 PM  Result Value Ref Range Status   MRSA by PCR NEGATIVE NEGATIVE Final    Comment:        The GeneXpert MRSA Assay (FDA approved for NASAL specimens only), is  one component of a comprehensive MRSA colonization surveillance program. It is not intended to diagnose MRSA infection nor to guide or monitor treatment for MRSA infections.       Radiology Studies: No results found.  Scheduled Meds: . busPIRone  15 mg Oral BID  . enoxaparin (LOVENOX) injection  40 mg Subcutaneous Q24H  . FLUoxetine  20 mg Oral Daily  . folic acid  1 mg Oral Daily  . furosemide  40 mg Oral Daily  . gabapentin  400 mg Oral TID  . lisdexamfetamine  20 mg Oral [id number]  . modafinil  200 mg Oral BID  . mometasone-formoterol  2 puff Inhalation BID  . multivitamin with minerals  1 tablet Oral Daily  . nicotine  14 mg Transdermal Daily  . pantoprazole  40 mg Oral Q1200  . rifaximin  550 mg Oral BID  . risperiDONE  0.5 mg Oral BID  . spironolactone  100 mg Oral Daily  . sucralfate  1 g Oral TID WC & HS  . thiamine  100 mg Oral Daily  . topiramate  100 mg Oral Daily  . triamcinolone cream   Topical TID   Continuous Infusions: . sodium chloride 10 mL/hr at 01/03/17 0947     LOS: 10 days   Time spent: 15 minutes.  Debbora Presto, MD [facility] Pager [phone]  If 7PM-7AM, please contact night-coverage [email or web address] Password [email or web address] 01/09/2017, 10:15 AM

## 2017-01-09 NOTE — Progress Notes (Signed)
Patient ambulated this morning around 300 ft with walker and I standby assist.  Patient tolerated walk very well w/o any c/o pain or discomfort.

## 2017-01-09 NOTE — Progress Notes (Signed)
Patent's wound vac therapy discontinued due to leak. Wound vac dressing was changed and new canister was added. Patient will not need to have a wound change tomorrow. Patient tolerated dressing change well. Will continue to monitor.

## 2017-01-10 DIAGNOSIS — Z9889 Other specified postprocedural states: Secondary | ICD-10-CM

## 2017-01-10 MED ORDER — FLUOXETINE HCL 20 MG PO CAPS: 20.0000 mg | ORAL_CAPSULE | Freq: Every day | ORAL | Status: DC

## 2017-01-10 MED ORDER — POTASSIUM CHLORIDE 20 MEQ/15ML (10%) PO SOLN: 40.0000 meq | Freq: Two times a day (BID) | ORAL | 0 refills | Status: DC

## 2017-01-10 MED ORDER — ALBUTEROL SULFATE HFA 108 (90 BASE) MCG/ACT IN AERS: 2.0000 | INHALATION_SPRAY | Freq: Four times a day (QID) | RESPIRATORY_TRACT | Status: DC | PRN

## 2017-01-10 MED ORDER — GABAPENTIN 400 MG PO CAPS: 400.0000 mg | ORAL_CAPSULE | Freq: Three times a day (TID) | ORAL | Status: DC

## 2017-01-10 MED ORDER — RIFAXIMIN 550 MG PO TABS: 550.0000 mg | ORAL_TABLET | Freq: Two times a day (BID) | ORAL | Status: DC

## 2017-01-10 MED ORDER — ONDANSETRON HCL 4 MG PO TABS: 4.0000 mg | ORAL_TABLET | Freq: Three times a day (TID) | ORAL | Status: DC | PRN

## 2017-01-10 MED ORDER — OXYCODONE HCL 5 MG PO TABS: 5.0000 mg | ORAL_TABLET | ORAL | 0 refills | Status: DC | PRN

## 2017-01-10 MED ORDER — TOPIRAMATE 100 MG PO TABS: 100.0000 mg | ORAL_TABLET | Freq: Every day | ORAL | Status: DC

## 2017-01-10 MED ORDER — POTASSIUM CHLORIDE 20 MEQ/15ML (10%) PO SOLN
40.0000 meq | Freq: Two times a day (BID) | ORAL | Status: DC
  Filled 2017-01-10: qty 30

## 2017-01-10 NOTE — Social Work (Addendum)
CSW met with pt at bedside to inform pt of [facility] confirmation of available bed and insurance authorization.  Pt was very irritated and stated that she had been told "Friday that I was going then, and then all weekend they told me it was going to be Monday, and then today they told me it was going to be tomorrow." Pt expressed that she would like to go home because CSW and care team are "playing her."   CSW informed patient that she still had a placement available at [facility], but if she wanted to dc home that [place] would follow up with RN Case Manager. Pt requested that CSW leave the room.   CSW will follow up with RN Case Manager and let her know that pt may be interested in dc home.   CSW will continue to follow up with RN Case Manager and MD to determine discharge plans.   Alexander Mt, [place] Work [phone]

## 2017-01-10 NOTE — Progress Notes (Signed)
[facility] Progress Note  11 Days Post-Op   Assessment/Plan Principal Problem:   Abdominal wall cellulitis Active Problems:   Hypokalemia   Alcohol use disorder, severe, dependence (HCC)   Alcoholic cirrhosis of liver with ascites (HCC)   Ventral hernia without obstruction or gangrene   S/P exploratory laparotomy  Ventral Hernia - S/PLaparotomy, partial omentectomy, primary umbilical hernia repair with sutures, placement negative pressure dressing, 11/22, Dr. Dwain SarnaWakefield -[facility]AC[facility],W,F Cirrhosis with varices secondary to alcoholabuse CIWA Cellulitis-resolved, not on abx currently History of multiple sclerosis  FEN:reg diet VTE: SCD's, lovenox ZO:XWRUEAVW:Rifaxmin 11/25>>Zosyn 11/22-11/27 Foley:none Follow up:Dr. Dwain SarnaWakefield x 3 weeks  DISPO:Pt is okay for discharge from a surgical standpoint. She is awaiting SNF placement. We will see M,W,F   LOS: 11 days    Subjective:  CC: abdominal pain  Pain is mild. Having BM's. Tolerating diet. Wound vac leak yesterday so it was changed on 12/2. No new complaints.   Objective: Vital signs in last 24 hours: Temp:  [98 F (36.7 C)-98.6 F (37 C)] 98.6 F (37 C) (12/03 0454) Pulse Rate:  [72-74] 74 (12/03 0454) Resp:  [16] 16 (12/03 0454) BP: (111-116)/(52-63) 111/52 (12/03 0454) SpO2:  [94 %-97 %] 94 % (12/03 0454) Last BM Date: 01/10/17  Intake/Output from previous day: 12/02 0701 - 12/03 0700 In: 480 [P.O.:480] Out: 2400 [Urine:2400] Intake/Output this shift: No intake/output data recorded.  PE: Gen: Alert, NAD, pleasant, cooperative, eating breakfast Card: RRR, no M/G/R heard Pulm:rate and effort normal Abd: Soft,not distended, vac in place,+BS, no TTP, no guarding Skin: no rashes noted, warm and dry Neuro: A&O x 3   Anti-infectives: Anti-infectives (From admission, onward)   Start     Dose/Rate Route Frequency Ordered Stop   01/03/17 1000  rifaximin (XIFAXAN) tablet 550  mg     550 mg Oral 2 times daily 01/03/17 0939     01/02/17 1100  rifaximin (XIFAXAN) tablet 200 mg  Status:  Discontinued     200 mg Oral 2 times daily 01/02/17 1055 01/03/17 0939   12/30/16 2300  vancomycin (VANCOCIN) IVPB 750 mg/150 ml premix  Status:  Discontinued     750 mg 150 mL/hr over 60 Minutes Intravenous Every 12 hours 12/30/16 1235 01/01/17 1251   12/30/16 1300  piperacillin-tazobactam (ZOSYN) IVPB 3.375 g  Status:  Discontinued     3.375 g 12.5 mL/hr over 240 Minutes Intravenous Every 8 hours 12/30/16 1231 01/05/17 0210   12/30/16 1000  vancomycin (VANCOCIN) IVPB 750 mg/150 ml premix  Status:  Discontinued     750 mg 150 mL/hr over 60 Minutes Intravenous Every 12 hours 12/30/16 0125 12/30/16 1233   12/30/16 0015  vancomycin (VANCOCIN) IVPB 1000 mg/200 mL premix     1,000 mg 200 mL/hr over 60 Minutes Intravenous  Once 12/30/16 0001 12/30/16 0503      Lab Results:  No results for input(s): WBC, HGB, HCT, PLT in the last 72 hours. BMET Recent Labs    01/08/17 0455 01/09/17 0454  NA 138 137  K 3.2* 3.4*  CL 105 107  CO2 26 22  GLUCOSE 101* 107*  BUN <5* 7  CREATININE 0.40* 0.53  CALCIUM 8.7* 8.6*   PT/INR No results for input(s): LABPROT, INR in the last 72 hours. CMP     Component Value Date/Time   NA 137 01/09/2017 0454   K 3.4 (L) 01/09/2017 0454   CL 107 01/09/2017 0454   CO2 22 01/09/2017 0454   GLUCOSE 107 (H) 01/09/2017 0454  BUN 7 01/09/2017 0454   CREATININE 0.53 01/09/2017 0454   CREATININE 0.37 (L) 12/30/2015 1425   CALCIUM 8.6 (L) 01/09/2017 0454   PROT 5.9 (L) 01/03/2017 0322   ALBUMIN 2.7 (L) 01/03/2017 0322   AST 19 01/03/2017 0322   ALT 13 (L) 01/03/2017 0322   ALKPHOS 84 01/03/2017 0322   BILITOT 1.2 01/03/2017 0322   GFRNONAA >60 01/09/2017 0454   GFRNONAA >89 12/30/2015 1425   GFRAA >60 01/09/2017 0454   GFRAA >89 12/30/2015 1425   Lipase     Component Value Date/Time   LIPASE 35 12/29/2016 2117    Studies/Results: No  results found.    Jerre Simon , [facility] 01/10/2017, 9:16 AM Pager: [phone] Consults: [phone] Mon-Fri 7:00 am-4:30 pm Sat-Sun 7:00 am-11:30 am

## 2017-01-10 NOTE — Care Management Note (Addendum)
Case Management Note  Patient Details  Name: Anne Black MRN: [id number] Date of Birth: 1960/02/27  Subjective/Objective:                    Action/Plan: SW and NCM spoke with patient again at length regarding home vs SNF. Patient stated she will go to SNF if she has a phone. If she won't have a phone in her room she will discharge to home with [facility] and home VAC . NCM explained process of getting home VAC , paperwork signed by CCS and faxed to [facility]. SW checking with SNF to see if she has a phone.   Social worker and NCM went to speak to patient to see if she wants to go to SNF or home. Patient states she cannot give answer until 1600 today . Attending aware . If patient discharges to home she will need home [facility] , will need MD signature. Started paperwork. Unlikely approval for home VAC would be received today. MD aware. Expected Discharge Date:  01/10/17               Expected Discharge Plan:     In-House Referral:     Discharge planning Services  CM Consult  Post Acute Care Choice:    Choice offered to:  Patient  DME Arranged:    DME Agency:     HH Arranged:    HH Agency:     Status of Service:  In process, will continue to follow  If discussed at Long Length of Stay Meetings, dates discussed:    Additional Comments:  Kingsley Plan, RN 01/10/2017, 3:41 PM

## 2017-01-10 NOTE — Progress Notes (Signed)
PT Cancellation Note  Patient Details Name: Anne Black MRN: [id number] DOB: 07-07-1960   Cancelled Treatment:    Noted MD DC summary, however opted to visit patient and offer any exercise/activity or other support that patient would like. Patient received sitting upright in chair on phone, ending phone call to work with PT and reporting that she is extremely frustrated and upset about her entire situation, including her DC plan. Actively listened and acknowledged patient concerns and frustrations, and offered work on balance/strength/walking in hallway with skilled PT services. Patient then became agitated with PT, stating "this is unacceptable that you come into my room and ask this when I've been walking in the hallway already today". Patient declines skilled PT services on this date.   Nedra Hai PT, DPT, CBIS  Supplemental Physical Therapist [facility]

## 2017-01-10 NOTE — Social Work (Signed)
CSW continuing to follow and provide support to pt with discharge planning.   [facility] [employer] [facility] [employer].   Doy HutchingIsabel H Khaliel Morey, LCSWA [facility] Clinical Social Work [phone]

## 2017-01-10 NOTE — Social Work (Signed)
CSW met with pt at length with RN Case Manager to discuss discharging home or discharging to [facility].   Pt will only agree to go to [facility] if she can have a phone. CSW contacted [employer] re: phone in room. No call back as of 5:30pm.   RN Case Manager also continuing to support pt if pt decided to dc with home health.   MD aware.   CSW continuing to follow.   Alexander Mt, [place] Work [phone]

## 2017-01-10 NOTE — Discharge Summary (Signed)
Physician Discharge Summary  Anne Black:[id number] DOB: 04-28-1960 DOA: 12/29/2016  PCP: Patient, No Pcp Per  Admit date: 12/29/2016 Discharge date: 01/10/2017  Admitted From: Home Disposition:  SNF  Recommendations for Outpatient Follow-up:  1. Follow up with Surgery in 1-2 weeks 2. Please obtain BMP/CBC in one week 3. She will need to follow-up with [facility] as an outpatient.  Home Health:no Equipment/Devices:none  Discharge Condition:stable CODE STATUS: Full Diet recommendation: Heart Healthy   Brief/Interim Summary: 56 y.o. female past medical history of alcohol abuse, underlying liver disease with a history of ascites and recurrent paracentesis, followed at [facility] for multiple sclerosis as well as bipolar disorder presented on the day of admission with a 2-day history of body cramps, generalized malaise malaise and poor appetite, CT scan of the abdomen on 1121 showed a ventral hernia with surrounding edema, patient endorsed some purulent drainage from an open wound surrounding the abdominal wall.     Discharge Diagnoses:  Principal Problem:   Abdominal wall cellulitis Active Problems:   Hypokalemia   Alcohol use disorder, severe, dependence (HCC)   Alcoholic cirrhosis of liver with ascites (HCC)   Ventral hernia without obstruction or gangrene   S/P exploratory laparotomy  Sepsis due to ventral hernia with incarceration status post laparotomy with hernia repair/abdominal wall cellulitis: Her sepsis has resolved, she completed treatment of IV Zosyn. On 12/30/2017 she was taken to the OR for laparotomy, partial omentectomy and umbilical hernia repair, she was transferred to the critical care service and was extubated on 01/01/2017 Wound care is following and doing dressing changes Monday Wednesday and Friday. Wound VAC per surgery recommendations. Continue solid diet which she is tolerating.  Hepatic cirrhosis with ascites and varices: Continue  rifaximin She is tolerating her Lasix and spironolactone. Can restart her oral lactulose.  GERD: Continue PPI.  Acute encephalopathy: In the setting of alcohol withdrawal with plus minus acute delirium and possibly hepatic encephalopathy.  Paroxysmal atrial fibrillation: She was responsive to beta-blocker and has converted to normal sinus rhythm. Cardiology was consulted who recommended follow-up as an outpatient. Her lites were repleted as needed.  Hypokalemia Replete orally.  Acute hypoxic and hypercarbic respiratory failure multifactorial in the setting of illness intubated prior to surgical intervention extubated on 01/01/2017, placed on BiPAP as needed. He has not required BiPAP over the last 4 days prior to discharge.  MS: Seems to be stable follow-up with [facility] as an outpatient.  Bipolar disorder/ADD: No changes made to his medication.  Alcoholism/alcohol withdrawal: Now resolved after using Precedex. With her last drink on 12/11/2016 Will need to follow-up with AA as an outpatient.  Normocytic anemia: No signs of GI bleed or overt bleeding.  Thrombocytopenia: Resolved likely due to sepsis.   Discharge Instructions  Discharge Instructions    Diet - low sodium heart healthy   Complete by:  As directed    Increase activity slowly   Complete by:  As directed      Allergies as of 01/10/2017   No Known Allergies     Medication List    STOP taking these medications   cephALEXin 500 MG capsule Commonly known as:  KEFLEX   sulfamethoxazole-trimethoprim 800-160 MG tablet Commonly known as:  BACTRIM DS,SEPTRA DS     TAKE these medications   albuterol 108 (90 Base) MCG/ACT inhaler Commonly known as:  PROVENTIL HFA;VENTOLIN HFA Inhale 2 puffs into the lungs every 6 (six) hours as needed for wheezing or shortness of breath.   busPIRone 15 MG  tablet Commonly known as:  BUSPAR TAKE 1 TABLET(15 MG) BY MOUTH TWICE DAILY What changed:  See the new  instructions.   FLUoxetine 20 MG capsule Commonly known as:  PROZAC Take 20 mg by mouth daily.   Fluticasone-Salmeterol 100-50 MCG/DOSE Aepb Commonly known as:  ADVAIR Inhale 1-2 puffs 2 (two) times daily into the lungs.   furosemide 40 MG tablet Commonly known as:  LASIX Take 1 tablet (40 mg total) by mouth daily.   gabapentin 400 MG capsule Commonly known as:  NEURONTIN Take 1 capsule (400 mg total) 3 (three) times daily by mouth.   Melatonin 3 MG Tabs Take 1 tablet (3 mg total) by mouth at bedtime as needed (sleep).   meloxicam 7.5 MG tablet Commonly known as:  MOBIC Take 7.5 mg by mouth daily.   modafinil 200 MG tablet Commonly known as:  PROVIGIL Take 200 mg by mouth daily.   ondansetron 4 MG tablet Commonly known as:  ZOFRAN Take 1 tablet (4 mg total) every 8 (eight) hours as needed by mouth for nausea or vomiting.   oxyCODONE 5 MG immediate release tablet Commonly known as:  Oxy IR/ROXICODONE Take 1 tablet (5 mg total) by mouth every 4 (four) hours as needed for severe pain.   pantoprazole 40 MG tablet Commonly known as:  PROTONIX Take 1 tablet (40 mg total) by mouth daily.   potassium chloride 20 MEQ/15ML (10%) Soln Take 30 mLs (40 mEq total) by mouth 2 (two) times daily.   rifaximin 550 MG Tabs tablet Commonly known as:  XIFAXAN Take 1 tablet (550 mg total) by mouth 2 (two) times daily.   risperiDONE 0.5 MG tablet Commonly known as:  RISPERDAL Take 1 tablet (0.5 mg total) by mouth 3 (three) times daily as needed (agitation). What changed:  when to take this   rizatriptan 10 MG tablet Commonly known as:  MAXALT Take 1 tablet (10 mg total) by mouth as needed for migraine. May repeat in 2 hours if needed   spironolactone 100 MG tablet Commonly known as:  ALDACTONE Take 1 tablet (100 mg total) by mouth daily.   topiramate 100 MG tablet Commonly known as:  TOPAMAX Take 1 tablet (100 mg total) by mouth daily.       Contact information for  follow-up providers    [name], MD Follow up in 3 week(s).   Specialty:  General Surgery Contact information: [address] [place] [address] [phone]            Contact information for after-discharge care    Destination    [facility] .   Service:  Skilled Nursing Contact information: [address] [place] [address] [phone]                 No Known Allergies  Consultations:  General surgery  Pulmonary and critical care.   Procedures/Studies: Dg Chest 2 View  Result Date: 12/13/2016 CLINICAL DATA:  Shortness of Breath EXAM: CHEST  2 VIEW COMPARISON:  Chest radiograph December 08, 2016 and chest CT December 09, 2016 FINDINGS: There is no edema or consolidation. The heart size and pulmonary vascularity are within normal limits. There is aortic atherosclerosis. No adenopathy. No evident bone lesions. IMPRESSION: No edema or consolidation.  There is aortic atherosclerosis. Aortic Atherosclerosis (ICD10-I70.0). Electronically Signed   By: Bretta Bang III M.D.   On: 12/13/2016 15:12   Ct Abdomen Pelvis W Contrast  Result Date: 12/21/2016 CLINICAL DATA:  Acute onset of generalized abdominal pain after drinking a bottle of wine. Current history of hepatic cirrhosis. EXAM: CT ABDOMEN AND PELVIS WITH CONTRAST TECHNIQUE: Multidetector CT imaging of the abdomen and pelvis was performed using the standard protocol following bolus administration of intravenous contrast. CONTRAST:  100 mL of Isovue 300 IV contrast COMPARISON:  CT of the abdomen and pelvis from 10/15/2015 FINDINGS: Lower chest: Mild scarring is noted at the left lingula. The lung bases are otherwise clear. The visualized portions of the mediastinum are unremarkable. Hepatobiliary: Previously noted diffuse fatty infiltration within the liver is less apparent. A vague focus of contrast blush is noted at the left hepatic lobe. Changes of hepatic  cirrhosis are again noted. The gallbladder is difficult to fully assess due to underlying ascites. Small volume ascites is seen tracking about the liver. The common bile duct remains normal in caliber. Mild gastric and esophageal varices are noted. Pancreas: The pancreas is within normal limits. Spleen: The spleen is unremarkable in appearance. Adrenals/Urinary Tract: The adrenal glands are unremarkable in appearance. The kidneys are within normal limits. There is no evidence of hydronephrosis. No renal or ureteral stones are identified. No perinephric stranding is seen. Stomach/Bowel: The stomach is unremarkable in appearance. The small bowel is within normal limits. The appendix is normal in caliber, without evidence of appendicitis. The colon is unremarkable in appearance. Vascular/Lymphatic: Scattered calcification is seen along the abdominal aorta and its branches. The abdominal aorta is otherwise grossly unremarkable. The inferior vena cava is grossly unremarkable. No retroperitoneal lymphadenopathy is seen. No pelvic sidewall lymphadenopathy is identified. Reproductive: The bladder is mildly distended and grossly unremarkable. The uterus is unremarkable. The ovaries are relatively symmetric. No suspicious adnexal masses are seen. Other: A prominent moderate anterior abdominal wall hernia is noted, new from 2017, with associated soft tissue edema. There is no evidence of bowel herniation at this time. Musculoskeletal: No acute osseous abnormalities are identified. The visualized musculature is unremarkable in appearance. IMPRESSION: 1. Changes of hepatic cirrhosis again noted. Mild gastric and esophageal varices seen. Nonspecific focus of contrast blush again seen at the left hepatic lobe. 2. Small volume ascites noted tracking about the liver. 3. Prominent moderate anterior abdominal wall hernia, new from 2017, with associated diffuse soft tissue edema. No evidence of bowel herniation at this time. 4. Mild  scarring at the left lingula. 5.  Aortic Atherosclerosis (ICD10-I70.0). Electronically Signed   By: Roanna Raider M.D.   On: 12/21/2016 04:48   Dg Chest Port 1 View  Result Date: 01/02/2017 CLINICAL DATA:  Respiratory failure. EXAM: PORTABLE CHEST 1 VIEW COMPARISON:  12/31/2016 FINDINGS: Endotracheal and enteric tubes have been removed. The cardiomediastinal silhouette is unchanged. There is minimal new opacity in both lung bases. No sizable pleural effusion or pneumothorax is identified. IMPRESSION: Minimal bibasilar opacity, likely atelectasis. Electronically Signed   By: Sebastian Ache M.D.   On: 01/02/2017 06:43   Dg Chest Port 1 View  Result Date: 12/31/2016 CLINICAL DATA:  Respiratory failure with hypoxia. EXAM: PORTABLE CHEST 1 VIEW COMPARISON:  12/30/2016 . FINDINGS: Endotracheal tube and NG tube in stable position. Heart size stable. Lungs are clear. No pleural effusion or pneumothorax. IMPRESSION: 1. Lines and tubes in stable position. 2.  Low lung volumes.  No acute infiltrate.  Heart size stable. Electronically Signed   By: Maisie Fus  Register   On: 12/31/2016 06:16   Portable Chest Xray  Result Date: 12/30/2016 CLINICAL DATA:  Hypoxia EXAM: PORTABLE CHEST 1 VIEW COMPARISON:  December 21, 2016 FINDINGS: Endotracheal tube tip is 2.9 cm above the carina. Nasogastric tube tip and side port are below the diaphragm. No pneumothorax. There is no edema or consolidation. The heart is upper normal in size with pulmonary vascularity within normal limits. No adenopathy. No bone lesions. IMPRESSION: Tube positions as described without pneumothorax. No edema or consolidation. Stable cardiac silhouette. Electronically Signed   By: Bretta BangWilliam  Woodruff III M.D.   On: 12/30/2016 13:05   Dg Chest Port 1 View  Result Date: 12/21/2016 CLINICAL DATA:  Acute onset of shortness of breath. EXAM: PORTABLE CHEST 1 VIEW COMPARISON:  Chest radiograph performed 12/13/2016 FINDINGS: The lungs are well-aerated. Mild left  basilar airspace opacity raises concern for pneumonia. There is no evidence of pleural effusion or pneumothorax. The cardiomediastinal silhouette is borderline normal in size. No acute osseous abnormalities are seen. IMPRESSION: Mild left basilar airspace opacity raises concern for pneumonia. Electronically Signed   By: Roanna RaiderJeffery  Chang M.D.   On: 12/21/2016 05:49   Dg Abd Portable 1v  Result Date: 12/30/2016 CLINICAL DATA:  Nasogastric tube placement. EXAM: PORTABLE ABDOMEN - 1 VIEW COMPARISON:  CT abdomen and pelvis December 21, 2016 FINDINGS: Nasogastric tube tip is in the distal third portion the duodenum with the side port in the distal second portion of the duodenum. There is no bowel dilatation or air-fluid level to suggest bowel obstruction. No evident free air. IMPRESSION: Nasogastric tube tip and side-port in duodenum. Bowel gas pattern unremarkable. No free air evident. Electronically Signed   By: Bretta BangWilliam  Woodruff III M.D.   On: 12/30/2016 13:06     Subjective:  No new complaints. Discharge Exam: Vitals:   01/09/17 2145 01/10/17 0454  BP: 116/63 (!) 111/52  Pulse: 72 74  Resp: 16 16  Temp: 98 F (36.7 C) 98.6 F (37 C)  SpO2: 97% 94%   Vitals:   01/09/17 0450 01/09/17 0859 01/09/17 2145 01/10/17 0454  BP: (!) 94/55  116/63 (!) 111/52  Pulse: 77  72 74  Resp: 16  16 16   Temp: 98.4 F (36.9 C)  98 F (36.7 C) 98.6 F (37 C)  TempSrc: Oral  Oral Oral  SpO2: 95% 95% 97% 94%  Weight:      Height:        General: Pt is alert, awake, not in acute distress Cardiovascular: RRR, S1/S2 +, no rubs, no gallops Respiratory: CTA bilaterally, no wheezing, no rhonchi Abdominal: Soft, NT, ND, bowel sounds + Extremities: no edema, no cyanosis    The results of significant diagnostics from this hospitalization (including imaging, microbiology, ancillary and laboratory) are listed below for reference.     Microbiology: No results found for this or any previous visit (from the  past 240 hour(s)).   Labs: BNP (last 3 results) Recent Labs    12/09/16 0041  BNP 63.7   Basic Metabolic Panel: Recent Labs  Lab 01/04/17 1504 01/05/17 0649 01/06/17 0457 01/07/17 0414 01/08/17 0455 01/09/17 0454  NA 139 138 137 138 138 137  K 3.0* 3.6 3.2* 3.7 3.2* 3.4*  CL 100* 105 103 108 105 107  CO2 33* 28 28 24 26 22   GLUCOSE 130* 102* 103* 104* 101* 107*  BUN <5* <5* <5* <5* <5* 7  CREATININE 0.42* 0.39* 0.47 0.49 0.40* 0.53  CALCIUM 8.2* 8.4* 8.8* 8.8* 8.7* 8.6*  MG 2.0 1.8 1.6* 1.8  --   --    Liver Function Tests: No results for input(s): AST, ALT, ALKPHOS, BILITOT, PROT, ALBUMIN in the  last 168 hours. No results for input(s): LIPASE, AMYLASE in the last 168 hours. No results for input(s): AMMONIA in the last 168 hours. CBC: Recent Labs  Lab 01/04/17 0308 01/06/17 0457  WBC 4.5 5.6  HGB 9.0* 10.6*  HCT 29.7* 34.6*  MCV 92.5 90.8  PLT 131* 156   Cardiac Enzymes: No results for input(s): CKTOTAL, CKMB, CKMBINDEX, TROPONINI in the last 168 hours. BNP: Invalid input(s): POCBNP CBG: Recent Labs  Lab 01/07/17 1726 01/07/17 2150 01/08/17 0813 01/08/17 1231 01/08/17 1701  GLUCAP 105* 162* 103* 110* 99   D-Dimer No results for input(s): DDIMER in the last 72 hours. Hgb A1c No results for input(s): HGBA1C in the last 72 hours. Lipid Profile No results for input(s): CHOL, HDL, LDLCALC, TRIG, CHOLHDL, LDLDIRECT in the last 72 hours. Thyroid function studies No results for input(s): TSH, T4TOTAL, T3FREE, THYROIDAB in the last 72 hours.  Invalid input(s): FREET3 Anemia work up No results for input(s): VITAMINB12, FOLATE, FERRITIN, TIBC, IRON, RETICCTPCT in the last 72 hours. Urinalysis    Component Value Date/Time   COLORURINE STRAW (A) 12/29/2016 2130   APPEARANCEUR CLEAR 12/29/2016 2130   LABSPEC 1.005 12/29/2016 2130   PHURINE 7.0 12/29/2016 2130   GLUCOSEU NEGATIVE 12/29/2016 2130   HGBUR NEGATIVE 12/29/2016 2130   BILIRUBINUR NEGATIVE  12/29/2016 2130   BILIRUBINUR moderate (A) 06/19/2015 1906   KETONESUR NEGATIVE 12/29/2016 2130   PROTEINUR NEGATIVE 12/29/2016 2130   UROBILINOGEN >=8.0 06/19/2015 1906   UROBILINOGEN 0.2 09/14/2011 2141   NITRITE NEGATIVE 12/29/2016 2130   LEUKOCYTESUR NEGATIVE 12/29/2016 2130   Sepsis Labs Invalid input(s): PROCALCITONIN,  WBC,  LACTICIDVEN Microbiology No results found for this or any previous visit (from the past 240 hour(s)).   Time coordinating discharge: Over 30 minutes  SIGNED:   Marinda Elk, MD  [facility] 01/10/2017, 12:31 PM Pager   If 7PM-7AM, please contact night-coverage [email or web address] Password [email or web address]

## 2017-01-11 LAB — BASIC METABOLIC PANEL
Anion gap: 12 (ref 5–15)
BUN: 8 mg/dL (ref 6–20)
CALCIUM: 8.9 mg/dL (ref 8.9–10.3)
CO2: 24 mmol/L (ref 22–32)
Chloride: 99 mmol/L — ABNORMAL LOW (ref 101–111)
Creatinine, Ser: 0.47 mg/dL (ref 0.44–1.00)
GFR calc Af Amer: >60 mL/min (ref >60)
GLUCOSE: 111 mg/dL — AB (ref 65–99)
Potassium: 2.8 mmol/L — ABNORMAL LOW (ref 3.5–5.1)
Sodium: 135 mmol/L (ref 135–145)

## 2017-01-11 MED ORDER — OXYCODONE HCL 5 MG PO TABS: ORAL_TABLET | ORAL | 0 refills | Status: DC

## 2017-01-11 MED ORDER — POTASSIUM CHLORIDE CRYS ER 20 MEQ PO TBCR
40.0000 meq | EXTENDED_RELEASE_TABLET | ORAL | Status: AC
  Administered 2017-01-11 (×2): 40 meq via ORAL
  Filled 2017-01-11 (×2): qty 2

## 2017-01-11 NOTE — Social Work (Addendum)
CSW and RN Case Manager spoke with pt at bedside about discharge decision needing to be made today.   Pt had questions about what would be provided by home health and RN Case Manager answered those questions.  Pt then asked CSW about [facility]: having a phone in her room, doctor, and transportation home if she decided to leave facility.  CSW called [facility] and the Admissions director spoke with pt on speaker phone, answering questions.   Pt stated she had no further questions for CSW or [employer].  CSW continuing to follow.   Doy Hutching, LCSWA [facility] Clinical Social Work [phone]

## 2017-01-11 NOTE — Progress Notes (Signed)
Anne Black discharged per MD order. Patient refused transport to SNF as originally planned and insisted on going home with Home health caring for wound vac dressing changes.  VSS, Skin clean, dry and intact without evidence of skin break down, no evidence of skin tears noted.  IV catheter discontinued intact. Site without signs and symptoms of complications. Dressing and pressure applied.  An After Visit Summary was printed and given to the patient. Patient received prescription.  Discharge education completed with patient including follow up instructions, medication list, d/c activities limitations if indicated, with other d/c instructions as indicated by MD - patient able to verbalize understanding, all questions fully answered.   Patient instructed to return to ED, call 911, or call MD for any changes in condition.   Patient to be escorted via WC, and D/C home via cab with cab voucher provided by Child psychotherapist.

## 2017-01-11 NOTE — Progress Notes (Signed)
  PROGRESS NOTE  Anne Black UMP:[id number] DOB: Oct 24, 1960 DOA: 12/29/2016 PCP: Patient, No Pcp Per  Assessment/Plan S/p sepsis secondary to incarcerated ventral hernia, s/p laparotomy. Has completed abx. - wound vac, wound care and follow-up per surgery   Patient now refuses SNF. Plan home with [facility] PT, OT, CSW, aide  Rx for oxycodone 10 mg one tab 3x week for dressing changes for pain #9. Previous Rx by Dr. Radonna Ricker destroyed by RN.   Brendia Sacks, MD  [facility] Direct contact: [phone] --Via amion app OR  --[email or web address]; password [email or web address]  7PM-7AM contact night coverage as above 01/11/2017, 5:38 PM  LOS: 12 days   Interval history/Subjective: Patient feels well. Wants to go home rather than to SNF.  Objective: Vitals:  Vitals:   01/10/17 2140 01/11/17 0437  BP: 105/64 112/66  Pulse: 96 88  Resp: 18 17  Temp: 98.6 F (37 C) 98.3 F (36.8 C)  SpO2: 96% 96%    Exam:  Constitutional:  . Appears calm and comfortable sitting in chair Respiratory:  . CTA bilaterally, no w/r/r.  . Respiratory effort normal.  Cardiovascular:  . RRR, no m/r/g . No LE extremity edema   Psychiatric:  . Mental status o Mood, affect appropriate  I have personally reviewed the following:   Labs:  K+ 2.8, remainder BMP unremarkable  Scheduled Meds: . busPIRone  15 mg Oral BID  . enoxaparin (LOVENOX) injection  40 mg Subcutaneous Q24H  . FLUoxetine  20 mg Oral Daily  . folic acid  1 mg Oral Daily  . furosemide  40 mg Oral Daily  . gabapentin  400 mg Oral TID  . lisdexamfetamine  20 mg Oral BH-q7a  . modafinil  200 mg Oral BID  . mometasone-formoterol  2 puff Inhalation BID  . multivitamin with minerals  1 tablet Oral Daily  . nicotine  14 mg Transdermal Daily  . pantoprazole  40 mg Oral Q1200  . rifaximin  550 mg Oral BID  . risperiDONE  0.5 mg Oral BID  . spironolactone  100 mg Oral Daily  . sucralfate  1 g Oral TID WC & HS  . thiamine  100 mg Oral Daily  .  topiramate  100 mg Oral Daily  . triamcinolone cream   Topical TID   Continuous Infusions: . sodium chloride 10 mL/hr at 01/03/17 0932    Principal Problem:   Abdominal wall cellulitis Active Problems:   Hypokalemia   Alcohol use disorder, severe, dependence (HCC)   Alcoholic cirrhosis of liver with ascites (HCC)   Ventral hernia without obstruction or gangrene   S/P exploratory laparotomy   LOS: 12 days

## 2017-01-11 NOTE — Care Management Note (Addendum)
Case Management Note  Patient Details  Name: Anne Black MRN: [id number] Date of Birth: [birth year]  Subjective/Objective:                    Action/Plan:  Patient has decided to go home. Home [employer] at bedside. Will leave  [facility] transportation paperwork and phone number in shadow chart for bedside nurse to call when ready to send patient home. SW and NCM again spoke with patient at bedside at length about discharge plan. Patient voices understanding that she is discharged and will have to decide whether she is going to SNF or home.   Spoke with [employer] home VAC has been released and will be delivered to hospital room today. Explained that to patient delivery will be today she will have to sign for VAC machine and box of dressing supplies. Explained bedside nurse will change hospital machine to home machine and she will take box of supplies home will her for [facility] to change three times a week. Patient asking for [facility] aide.Explained NCM will ask MD for order . Patient also asking if she goes home and decides she wants to go to SNF if that is possible. Explained NCM will ask for SW order. HHSW will assist her in placement, however, will have to get insurance authorization again at that point.  NCM sent text page to Dr Lynnell Dike for orders for HHPT/OT/aide and Child psychotherapist.    Patient asking what HHaide does . Explained helps with bath , however not daily. Patient asking if  [facility] aide will prepare meals . NCM explained HH aide does not.   If patient discharges today she will need transportation home . Explained transportation will be arranged through [facility] ambulance who will file with her insurance.   Patient voiced understanding to all of the above.   SW following up on questions patient asked regarding SNF  Expected Discharge Date:  01/10/17               Expected Discharge Plan:     In-House Referral:     Discharge planning Services  CM Consult  Post Acute Care Choice:    Choice offered to:   Patient  DME Arranged:    DME Agency:     HH Arranged:    HH Agency:     Status of Service:  In process, will continue to follow  If discussed at Long Length of Stay Meetings, dates discussed:    Additional Comments:  Kingsley Plan, RN 01/11/2017, 10:52 AM

## 2017-01-11 NOTE — Social Work (Signed)
CSW was informed that pt told RN Case Manager that she is going home.   CSW supported RN Case Manager in discharge by providing pt a cab voucher to get home.   Pt has been given cab voucher.  CSW signing off. Please consult if any additional needs arise.  Doy Hutching, LCSWA [facility] Clinical Social Work [phone]

## 2017-01-13 ENCOUNTER — Telehealth: Payer: Self-pay | Admitting: Physician Assistant

## 2017-01-13 NOTE — Telephone Encounter (Signed)
Patient states she talked about her medication on her visit in October.     I see a referral was put in for phychiatric in October.  Patient states she doesn't need it.       Please advise.

## 2017-01-13 NOTE — Telephone Encounter (Signed)
Copied from CRM [id number]. Topic: Quick Communication - See Telephone Encounter >> Jan 13, 2017 11:01 AM Waymon AmatoBurton, Donna F wrote: CRM for notification. See Telephone encounter for: pt is calling stating that she has just had hernia surgery and is needing to get her adhd medication refilled and she states that Chelle is ususally the one to refill this and that from what she can understand it that when she was in the office she was willing to fill the meds but chelle has since changed her mind and she would like some help with this but she needs her meds   Best number [phone]  01/13/17.

## 2017-01-13 NOTE — Telephone Encounter (Signed)
Anne Black   On refilling  Her  ADHD medication

## 2017-01-13 NOTE — Telephone Encounter (Signed)
Copied from CRM #[id number]. Topic: Quick Communication - See Telephone Encounter °>> Jan 13, 2017 11:01 AM Burton, Donna F wrote: °CRM for notification. See Telephone encounter for: pt is calling stating that she has just had hernia surgery and is needing to get her adhd medication refilled and she states that Chelle is ususally the one to refill this and that from what she can °understand it that when she was in the office she was willing to fill the meds but chelle has since changed her mind and she would like some help with this but she needs her meds  ° °Best number [phone] ° °01/13/17. °

## 2017-01-13 NOTE — Telephone Encounter (Unsigned)
Copied from CRM #[id number]. Topic: Quick Communication - See Telephone Encounter °>> Jan 13, 2017 11:01 AM Burton, Donna F wrote: °CRM for notification. See Telephone encounter for: pt is calling stating that she has just had hernia surgery and is needing to get her adhd medication refilled and she states that Chelle is ususally the one to refill this and that from what she can °understand it that when she was in the office she was willing to fill the meds but chelle has since changed her mind and she would like some help with this but she needs her meds  ° °Best number [phone] ° °01/13/17. °

## 2017-01-17 ENCOUNTER — Other Ambulatory Visit: Payer: Self-pay

## 2017-01-17 ENCOUNTER — Emergency Department (HOSPITAL_COMMUNITY)
Admission: EM | Admit: 2017-01-17 | Discharge: 2017-01-18 | Disposition: A | Discharge status: Home / Self Care | Payer: 59 | Attending: Emergency Medicine | Admitting: Emergency Medicine

## 2017-01-17 ENCOUNTER — Encounter (HOSPITAL_COMMUNITY): Payer: Self-pay

## 2017-01-17 DIAGNOSIS — Z79899 Other long term (current) drug therapy: Secondary | ICD-10-CM | POA: Insufficient documentation

## 2017-01-17 DIAGNOSIS — R45851 Suicidal ideations: Secondary | ICD-10-CM | POA: Diagnosis not present

## 2017-01-17 DIAGNOSIS — F17210 Nicotine dependence, cigarettes, uncomplicated: Secondary | ICD-10-CM | POA: Insufficient documentation

## 2017-01-17 DIAGNOSIS — R109 Unspecified abdominal pain: Secondary | ICD-10-CM | POA: Diagnosis not present

## 2017-01-17 DIAGNOSIS — R4589 Other symptoms and signs involving emotional state: Secondary | ICD-10-CM | POA: Insufficient documentation

## 2017-01-17 LAB — COMPREHENSIVE METABOLIC PANEL
ALT: 16 U/L (ref 14–54)
AST: 29 U/L (ref 15–41)
Albumin: 3.3 g/dL — ABNORMAL LOW (ref 3.5–5.0)
Alkaline Phosphatase: 125 U/L (ref 38–126)
Anion gap: 10 (ref 5–15)
BUN: <5 mg/dL — ABNORMAL LOW (ref 6–20)
CO2: 30 mmol/L (ref 22–32)
Calcium: 8.8 mg/dL — ABNORMAL LOW (ref 8.9–10.3)
Chloride: 96 mmol/L — ABNORMAL LOW (ref 101–111)
Creatinine, Ser: 0.52 mg/dL (ref 0.44–1.00)
GFR calc Af Amer: >60 mL/min (ref >60)
GFR calc non Af Amer: >60 mL/min (ref >60)
Glucose, Bld: 105 mg/dL — ABNORMAL HIGH (ref 65–99)
Potassium: 3.2 mmol/L — ABNORMAL LOW (ref 3.5–5.1)
Sodium: 136 mmol/L (ref 135–145)
Total Bilirubin: 0.9 mg/dL (ref 0.3–1.2)
Total Protein: 6.6 g/dL (ref 6.5–8.1)

## 2017-01-17 LAB — RAPID URINE DRUG SCREEN, HOSP PERFORMED
Amphetamines: NOT DETECTED
Barbiturates: NOT DETECTED
Benzodiazepines: NOT DETECTED
Cocaine: NOT DETECTED
Opiates: NOT DETECTED
Tetrahydrocannabinol: NOT DETECTED

## 2017-01-17 LAB — SALICYLATE LEVEL: Salicylate Lvl: <7 mg/dL (ref 2.8–30.0)

## 2017-01-17 LAB — CBC
HCT: 36.1 % (ref 36.0–46.0)
Hemoglobin: 11 g/dL — ABNORMAL LOW (ref 12.0–15.0)
MCH: 27.6 pg (ref 26.0–34.0)
MCHC: 30.5 g/dL (ref 30.0–36.0)
MCV: 90.7 fL (ref 78.0–100.0)
Platelets: 219 10*3/uL (ref 150–400)
RBC: 3.98 MIL/uL (ref 3.87–5.11)
RDW: 14.9 % (ref 11.5–15.5)
WBC: 8.2 10*3/uL (ref 4.0–10.5)

## 2017-01-17 LAB — ETHANOL: Alcohol, Ethyl (B): <10 mg/dL (ref <10)

## 2017-01-17 LAB — AMMONIA: Ammonia: 43 umol/L — ABNORMAL HIGH (ref 9–35)

## 2017-01-17 LAB — I-STAT BETA HCG BLOOD, ED (MC, WL, AP ONLY): I-stat hCG, quantitative: <5 m[IU]/mL (ref <5)

## 2017-01-17 LAB — ACETAMINOPHEN LEVEL: Acetaminophen (Tylenol), Serum: <10 ug/mL — ABNORMAL LOW (ref 10–30)

## 2017-01-17 LAB — LIPASE, BLOOD: Lipase: 29 U/L (ref 11–51)

## 2017-01-17 MED ORDER — ONDANSETRON HCL 4 MG PO TABS: 4.0000 mg | ORAL_TABLET | Freq: Three times a day (TID) | ORAL | Status: DC | PRN

## 2017-01-17 MED ORDER — FUROSEMIDE 20 MG PO TABS
40.0000 mg | ORAL_TABLET | Freq: Every day | ORAL | Status: DC
  Administered 2017-01-17 – 2017-01-18 (×2): 40 mg via ORAL
  Filled 2017-01-17 (×2): qty 2

## 2017-01-17 MED ORDER — SUMATRIPTAN SUCCINATE 50 MG PO TABS
50.0000 mg | ORAL_TABLET | ORAL | Status: DC | PRN
  Filled 2017-01-17: qty 1

## 2017-01-17 MED ORDER — ALBUTEROL SULFATE HFA 108 (90 BASE) MCG/ACT IN AERS
2.0000 | INHALATION_SPRAY | Freq: Four times a day (QID) | RESPIRATORY_TRACT | Status: DC | PRN
  Administered 2017-01-18: 2 via RESPIRATORY_TRACT
  Filled 2017-01-17: qty 6.7

## 2017-01-17 MED ORDER — FLUOXETINE HCL 20 MG PO CAPS
20.0000 mg | ORAL_CAPSULE | Freq: Every day | ORAL | Status: DC
  Administered 2017-01-17 – 2017-01-18 (×2): 20 mg via ORAL
  Filled 2017-01-17 (×2): qty 1

## 2017-01-17 MED ORDER — IBUPROFEN 400 MG PO TABS
600.0000 mg | ORAL_TABLET | Freq: Three times a day (TID) | ORAL | Status: DC | PRN
  Administered 2017-01-17 – 2017-01-18 (×3): 600 mg via ORAL
  Filled 2017-01-17 (×3): qty 1

## 2017-01-17 MED ORDER — GABAPENTIN 400 MG PO CAPS
400.0000 mg | ORAL_CAPSULE | Freq: Three times a day (TID) | ORAL | Status: DC
  Administered 2017-01-17 – 2017-01-18 (×2): 400 mg via ORAL
  Filled 2017-01-17 (×2): qty 1

## 2017-01-17 MED ORDER — BUSPIRONE HCL 10 MG PO TABS
15.0000 mg | ORAL_TABLET | Freq: Two times a day (BID) | ORAL | Status: DC
  Administered 2017-01-17 – 2017-01-18 (×2): 15 mg via ORAL
  Filled 2017-01-17 (×2): qty 2

## 2017-01-17 MED ORDER — ZOLPIDEM TARTRATE 5 MG PO TABS
5.0000 mg | ORAL_TABLET | Freq: Every evening | ORAL | Status: DC | PRN
  Administered 2017-01-17: 5 mg via ORAL
  Filled 2017-01-17: qty 1

## 2017-01-17 MED ORDER — MOMETASONE FURO-FORMOTEROL FUM 100-5 MCG/ACT IN AERO: 2.0000 | INHALATION_SPRAY | Freq: Two times a day (BID) | RESPIRATORY_TRACT | Status: DC

## 2017-01-17 MED ORDER — PANTOPRAZOLE SODIUM 40 MG PO TBEC
40.0000 mg | DELAYED_RELEASE_TABLET | Freq: Every day | ORAL | Status: DC
  Administered 2017-01-17 – 2017-01-18 (×2): 40 mg via ORAL
  Filled 2017-01-17 (×2): qty 1

## 2017-01-17 MED ORDER — SPIRONOLACTONE 100 MG PO TABS
100.0000 mg | ORAL_TABLET | Freq: Every day | ORAL | Status: DC
  Administered 2017-01-17 – 2017-01-18 (×2): 100 mg via ORAL
  Filled 2017-01-17 (×2): qty 1

## 2017-01-17 MED ORDER — RISPERIDONE 0.5 MG PO TABS
0.5000 mg | ORAL_TABLET | Freq: Two times a day (BID) | ORAL | Status: DC
  Administered 2017-01-17 – 2017-01-18 (×2): 0.5 mg via ORAL
  Filled 2017-01-17 (×2): qty 1

## 2017-01-17 MED ORDER — ALUM & MAG HYDROXIDE-SIMETH 200-200-20 MG/5ML PO SUSP: 30.0000 mL | Freq: Four times a day (QID) | ORAL | Status: DC | PRN

## 2017-01-17 NOTE — ED Notes (Addendum)
Placed pt on 2L, O2 increased to 95%.   Will continue to monitor

## 2017-01-17 NOTE — ED Triage Notes (Signed)
Per [facility], Pt is coming from home and had an abdominal hernia repair two weeks ago. Pt reports that she has continued to have abdominal pain, diarrhea, and nausea for the past couple days. Pt reports the pain is a dull ache, but she is unable to take pain medication because it gives her a headache. Reports SI that has worsened after the surgery. Vitals per EMS: 124/86 , 88 HR, 18 RR.

## 2017-01-17 NOTE — ED Notes (Addendum)
Pt oob to br to void. Pt states she feels shob with movement. Hx of COPD. PA at bedside

## 2017-01-17 NOTE — ED Notes (Signed)
TTS in progress. Pt repeatedly states she wants medical admission to 6w.

## 2017-01-17 NOTE — ED Provider Notes (Signed)
[facility] Provider Note   CSN: [id number] Arrival date & time: 01/17/17  1416     History   Chief Complaint Chief Complaint  Patient presents with  . Post-op Problem  . Suicidal    HPI Anne Black is a 56 y.o. female who presents the emergency department with chief complaint of severe depression and abdominal pain.  Patient has a past medical history of liver disease with ascites, history of alcohol abuse and dependence.  She is a history of bipolar disorder and [facility] and her mental health issues.  The patient states that she is feeling "profound sadness" and is excessively tearful throughout our entire conversation.  She states that she has passive suicidal ideation and just prays that she will not wake up.  She states that she was feeling tearful before her exploratory open laparotomy that occurred on 11/22 secondary to infected incarcerated hernia and sepsis.  She said that since that time she has been profoundly depressed.  She has a history of previous hospitalizations for depression.  The patient states that she used to be on Vyvanse 70 mg but her prescriber does not want to give it to her anymore after she took pain medication from an emergent ORIF of her left wrist.  Patient has not been taking her Prozac since she was discharged from the hospital.  She was given oxycodone at discharge for pain however states that she is unable to take it because it gives her severe headaches.  Because she is still postop she is unable to drive and has not been able to use OTC pain medicines because she does not have them at home.  She denies other drugs or alcohol abuse at this time.  HPI  Past Medical History:  Diagnosis Date  . ADHD (attention deficit hyperactivity disorder)   . Anxiety   . Arthritis   . Ascites   . Bipolar disorder (HCC)   . Cirrhosis (HCC)   . Hypokalemia   . Migraine   . Multiple  sclerosis (HCC)   . Osteoporosis     Patient Active Problem List   Diagnosis Date Noted  . Abdominal wall cellulitis 12/30/2016  . S/P exploratory laparotomy 12/30/2016  . Distal radius fracture, left 09/09/2016  . Thrombocytopenia (HCC) 09/09/2016  . Chest x-ray abnormality   . Umbilical hernia without obstruction and without gangrene 05/12/2016  . Itching 02/24/2016  . Ventral hernia without obstruction or gangrene 02/24/2016  . Palliative care encounter   . Ascites   . Tachypnea   . Typical atrial flutter (HCC)   . Hypomagnesemia   . Hypoxia   . Dehydration 01/22/2016  . Low back ache 12/30/2015  . Non-intractable vomiting with nausea   . Alcohol abuse with alcohol-induced mood disorder (HCC) 12/15/2015  . Acute alcoholism (HCC)   . Abdominal distension 11/04/2015  . Alcohol abuse   . Dyspnea   . Acute respiratory failure (HCC) 10/15/2015  . Ascites due to alcoholic cirrhosis (HCC) 10/15/2015  . Osteoporosis   . Bipolar I disorder (HCC)   . Multiple sclerosis (HCC)   . Alcoholic cirrhosis of liver with ascites (HCC) 08/22/2015  . Bipolar disorder, current episode mixed, moderate (HCC) 08/01/2015  . Alcohol use disorder, severe, dependence (HCC) 07/31/2015  . Macrocytic anemia- due to alcohol abuse with normal B12 & folate levels 05/31/2015  . Severe protein-calorie malnutrition (HCC) 05/31/2015  . Hypokalemia 05/26/2015  . Encephalopathy, hepatic (HCC)  05/26/2015  . Alcohol withdrawal (HCC) 05/18/2015  . Abdominal pain 05/18/2015  . Anxiety 05/18/2015  . UTI (lower urinary tract infection) 05/18/2015  . Stimulant abuse (HCC) 12/27/2014  . Nicotine abuse 12/27/2014  . Hyperprolactinemia (HCC) 11/15/2014  . Dyslipidemia   . Tobacco abuse 01/23/2014  . Noncompliance with therapeutic plan 04/04/2013  . Unspecified hereditary and idiopathic peripheral neuropathy 05/01/2012  . GERD (gastroesophageal reflux disease) 05/01/2012  . Osteoarthrosis, unspecified whether  generalized or localized, involving lower leg 05/01/2012  . Hyponatremia 07/01/2011    Past Surgical History:  Procedure Laterality Date  . ABDOMINAL WALL DEFECT REPAIR N/A 12/30/2016   Procedure: EXPLORATORY LAPAROTOMY WITH REPAIR ABDOMINAL WALL VENTRAL HERNIA;  Surgeon: [name], MD;  Location: [facility] OR;  Service: General;  Laterality: N/A;  . APPLICATION OF WOUND VAC N/A 12/30/2016   Procedure: APPLICATION OF WOUND VAC;  Surgeon: [name], MD;  Location: [facility] OR;  Service: General;  Laterality: N/A;  . CESAREAN SECTION  [phone]  . FRACTURE SURGERY    . HERNIA REPAIR    . MYRINGOTOMY WITH TUBE PLACEMENT Bilateral   . ORIF WRIST FRACTURE Left 09/09/2016   Procedure: OPEN REDUCTION INTERNAL FIXATION (ORIF) LEFT WRIST FRACTURE, LEFT CARPAL TUNNEL RELEASE;  Surgeon: Dominica Severin, MD;  Location: [facility] ORS;  Service: Orthopedics;  Laterality: Left;  . TONSILLECTOMY      OB History    No data available       Home Medications    Prior to Admission medications   Medication Sig Start Date End Date Taking? Authorizing Provider  albuterol (PROVENTIL HFA;VENTOLIN HFA) 108 (90 Base) MCG/ACT inhaler Inhale 2 puffs into the lungs every 6 (six) hours as needed for wheezing or shortness of breath.   Yes [provider]  busPIRone (BUSPAR) 15 MG tablet TAKE 1 TABLET(15 MG) BY MOUTH TWICE DAILY Patient taking differently: Take 15 mg by mouth two times a day 04/19/16  Yes [name], MD  FLUoxetine (PROZAC) 20 MG capsule Take 20 mg by mouth daily.  03/30/16  Yes [provider]  furosemide (LASIX) 40 MG tablet Take 1 tablet (40 mg total) by mouth daily. 06/17/16  Yes Midge Minium, MD  gabapentin (NEURONTIN) 400 MG capsule Take 1 capsule (400 mg total) 3 (three) times daily by mouth. 12/26/16  Yes Lord, Herminio Heads, NP  Melatonin 3 MG TABS Take 1 tablet (3 mg total) by mouth at bedtime as needed (sleep). 10/24/15  Yes Garth Bigness, MD  modafinil (PROVIGIL)  200 MG tablet Take 200 mg by mouth daily. 08/04/16  Yes [provider]  ondansetron (ZOFRAN) 4 MG tablet Take 1 tablet (4 mg total) every 8 (eight) hours as needed by mouth for nausea or vomiting. 12/26/16  Yes Charm Rings, NP  pantoprazole (PROTONIX) 40 MG tablet Take 1 tablet (40 mg total) by mouth daily. 12/30/15  Yes Haney, Alyssa A, MD  risperiDONE (RISPERDAL) 0.5 MG tablet Take 1 tablet (0.5 mg total) by mouth 3 (three) times daily as needed (agitation). Patient taking differently: Take 0.5 mg by mouth 2 (two) times daily.  02/06/16  Yes Renne Musca, MD  rizatriptan (MAXALT) 10 MG tablet Take 1 tablet (10 mg total) by mouth as needed for migraine. May repeat in 2 hours if needed 12/06/16  Yes Jeffery, Chelle, PA-C  spironolactone (ALDACTONE) 100 MG tablet Take 1 tablet (100 mg total) by mouth daily. 11/23/16  Yes Derwood Kaplan, MD  topiramate (TOPAMAX) 100 MG tablet Take 1 tablet (100 mg  total) by mouth daily. 01/07/16  Yes [name], MD  Fluticasone-Salmeterol (ADVAIR) 100-50 MCG/DOSE AEPB Inhale 1-2 puffs 2 (two) times daily into the lungs.  11/05/14   [provider]  oxyCODONE (OXY IR/ROXICODONE) 5 MG immediate release tablet Take one tablet prior to dressing changes three times per week for pain. Patient not taking: Reported on 01/17/2017 01/11/17   Standley Brooking, MD  potassium chloride 20 MEQ/15ML (10%) SOLN Take 30 mLs (40 mEq total) by mouth 2 (two) times daily. 01/10/17   Marinda Elk, MD  rifaximin (XIFAXAN) 550 MG TABS tablet Take 1 tablet (550 mg total) by mouth 2 (two) times daily. Patient not taking: Reported on 01/17/2017 06/05/15   Catarina Hartshorn, MD    Family History Family History  Problem Relation Age of Onset  . Arrhythmia Mother   . Heart disease Father   . Hypertension Father     Social History Social History   Tobacco Use  . Smoking status: Current Every Day Smoker    Packs/day: 1.00    Years: 20.00    Pack  years: 20.00    Types: Cigarettes  . Smokeless tobacco: Never Used  . Tobacco comment: Currently Vaping Daily  Substance Use Topics  . Alcohol use: Yes    Alcohol/week: 12.6 oz    Types: 21 Glasses of wine per week  . Drug use: No     Allergies   Patient has no known allergies.   Review of Systems Review of Systems Ten systems reviewed and are negative for acute change, except as noted in the HPI.    Physical Exam Updated Vital Signs BP (!) 110/56 (BP Location: Right Arm)   Pulse 76   Temp 98.3 F (36.8 C) (Oral)   Resp 16   Ht 5\' 2"  (1.575 m)   Wt 80.3 kg (177 lb)   SpO2 95%   BMI 32.37 kg/m   Physical Exam  Constitutional: She is oriented to person, place, and time. She appears well-developed and well-nourished. No distress.  HENT:  Head: Normocephalic and atraumatic.  Eyes: Conjunctivae are normal. No scleral icterus.  Neck: Normal range of motion.  Cardiovascular: Normal rate, regular rhythm and normal heart sounds. Exam reveals no gallop and no friction rub.  No murmur heard. Pulmonary/Chest: Effort normal and breath sounds normal. No respiratory distress.  Abdominal: Soft. Bowel sounds are normal. She exhibits no distension and no mass. There is no tenderness. There is no guarding.  Large postsurgical abdominal wound of the lower part of the abdomen.  There is pink granulation tissue present.  It appears to be well-healing, no signs of infection.  Neurological: She is alert and oriented to person, place, and time.  Skin: Skin is warm and dry. She is not diaphoretic.  Psychiatric: Her behavior is normal. Her mood appears anxious. Her affect is labile. Her speech is tangential. She exhibits a depressed mood. She is inattentive.  Nursing note and vitals reviewed.    ED Treatments / Results  Labs (all labs ordered are listed, but only abnormal results are displayed) Labs Reviewed  COMPREHENSIVE METABOLIC PANEL - Abnormal; Notable for the following components:       Result Value   Potassium 3.2 (*)    Chloride 96 (*)    Glucose, Bld 105 (*)    BUN <5 (*)    Calcium 8.8 (*)    Albumin 3.3 (*)    All other components within normal limits  ACETAMINOPHEN LEVEL - Abnormal; Notable  for the following components:   Acetaminophen (Tylenol), Serum <10 (*)    All other components within normal limits  CBC - Abnormal; Notable for the following components:   Hemoglobin 11.0 (*)    All other components within normal limits  AMMONIA - Abnormal; Notable for the following components:   Ammonia 43 (*)    All other components within normal limits  ETHANOL  SALICYLATE LEVEL  RAPID URINE DRUG SCREEN, HOSP PERFORMED  LIPASE, BLOOD  I-STAT BETA HCG BLOOD, ED ([facility], [facility], [facility] ONLY)    EKG  EKG Interpretation None       Radiology No results found.  Procedures Procedures (including critical care time)  Medications Ordered in ED Medications  ibuprofen (ADVIL,MOTRIN) tablet 600 mg (600 mg Oral Given 01/17/17 1847)  ondansetron (ZOFRAN) tablet 4 mg (not administered)  alum & mag hydroxide-simeth (MAALOX/MYLANTA) 200-200-20 MG/5ML suspension 30 mL (not administered)  zolpidem (AMBIEN) tablet 5 mg (5 mg Oral Given 01/17/17 2059)  albuterol (PROVENTIL HFA;VENTOLIN HFA) 108 (90 Base) MCG/ACT inhaler 2 puff (not administered)  busPIRone (BUSPAR) tablet 15 mg (15 mg Oral Given 01/17/17 2100)  FLUoxetine (PROZAC) capsule 20 mg (20 mg Oral Given 01/17/17 2059)  furosemide (LASIX) tablet 40 mg (40 mg Oral Given 01/17/17 1847)  gabapentin (NEURONTIN) capsule 400 mg (400 mg Oral Not Given 01/17/17 2358)  ondansetron (ZOFRAN) tablet 4 mg (not administered)  pantoprazole (PROTONIX) EC tablet 40 mg (40 mg Oral Given 01/17/17 1847)  risperiDONE (RISPERDAL) tablet 0.5 mg (0.5 mg Oral Given 01/17/17 2059)  SUMAtriptan (IMITREX) tablet 50 mg (not administered)  spironolactone (ALDACTONE) tablet 100 mg (100 mg Oral Given 01/17/17 2059)     Initial Impression /  Assessment and Plan / ED Course  I have reviewed the triage vital signs and the nursing notes.  Pertinent labs & imaging results that were available during my care of the patient were reviewed by me and considered in my medical decision making (see chart for details).  Clinical Course as of Jan 19 28  Mon Jan 17, 2017  2046 Patient evaluated by TTS who recommends inpatient tx.   [AH]    Clinical Course User Index [AH] Arthor Captain, PA-C      Final Clinical Impressions(s) / ED Diagnoses   Final diagnoses:  Dysphoric mood    ED Discharge Orders    None       Arthor Captain, PA-C 01/18/17 0030    Raeford Razor, MD 01/18/17 1930

## 2017-01-17 NOTE — ED Notes (Addendum)
Pt resting quietly until I walk into room then she calls out and states she has "severe cramps" multiple times. Pt has no further cramps and speaks easily while asking about direct admission from ED to [facility] place. PA aware iof complaints.

## 2017-01-17 NOTE — ED Notes (Signed)
Pt states the she has been cleared by the person on the screen. Pt again states she feels she need to be admitted to medical floor so she can be cared for until we make arrangement forf her to go to [facility]. Pt also requesting pain medication that will  Not cause a headache as the oxycodone medication that she is currently taking at home.  I explained to pt that her provider had to make those decisions and I had already relayed her request from previous statement for same.

## 2017-01-17 NOTE — ED Notes (Signed)
Pt initially states she is suicidal then states she only said this because her neighbor told her this would get her admitted. Belongings removed.

## 2017-01-17 NOTE — ED Triage Notes (Signed)
Pt reports that she has not had her Prozac since she came home from the hospital because she cannot find it. Also, complains of generalized weakness and not feeling well.

## 2017-01-17 NOTE — ED Notes (Signed)
Pt again assisted to bathrrom. Steady gairt.
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
## 2017-01-18 ENCOUNTER — Emergency Department (HOSPITAL_COMMUNITY): Payer: 59

## 2017-01-18 DIAGNOSIS — R109 Unspecified abdominal pain: Secondary | ICD-10-CM

## 2017-01-18 DIAGNOSIS — F411 Generalized anxiety disorder: Secondary | ICD-10-CM | POA: Diagnosis not present

## 2017-01-18 DIAGNOSIS — F17210 Nicotine dependence, cigarettes, uncomplicated: Secondary | ICD-10-CM | POA: Diagnosis not present

## 2017-01-18 MED ORDER — IOPAMIDOL (ISOVUE-370) INJECTION 76%
INTRAVENOUS | Status: AC
  Administered 2017-01-18: 100 mL
  Filled 2017-01-18: qty 100

## 2017-01-18 MED ORDER — POTASSIUM CHLORIDE CRYS ER 20 MEQ PO TBCR
40.0000 meq | EXTENDED_RELEASE_TABLET | ORAL | Status: DC
  Administered 2017-01-18 (×2): 40 meq via ORAL
  Filled 2017-01-18 (×2): qty 2

## 2017-01-18 NOTE — ED Provider Notes (Addendum)
  Physical Exam  BP (!) 113/96 (BP Location: Right Arm)   Pulse 72   Temp 98.3 F (36.8 C) (Oral)   Resp 16   Ht 5\' 2"  (1.575 m)   Wt 80.3 kg (177 lb)   SpO2 95%   BMI 32.37 kg/m    Physical Exam  ED Course/Procedures   Clinical Course as of Jan 19 848  Mon Jan 17, 2017  2046 Patient evaluated by TTS who recommends inpatient tx.   [AH]    Clinical Course User Index [AH] Arthor Captain, PA-C    Procedures  MDM    CT scan does not show pulmonary embolism.  Has had some hypoxia.  Sats are okay on the oxygen.  Will consult wound care nurse about recommendations.  Patient reportedly now states that she had lied to states she was suicidal at an attempt to get a medicine admission under advice from her neighbor.  It appears as if TTS was aware of this fact yesterday but recommend inpatient treatment.  Will have reevaluation today and patient may require rehab treatment, which patient initially refused after her surgery.   Benjiman Core, MD 01/18/17 [phone]  patient has been seen by psychiatry now and cleared for discharge.  No longer hypoxic on room air.  Chest CT does not show pulmonary embolism.  Will discharge home.  Patient no longer wants inpatient rehab.  States she has some things to do at home.  Requested for me to refill her Vyvanse.  Has been according to the drug database out of it for the last 2-3 months.  Patient states it has not and it was refilled with a recent visit to the hospital.  Was told I cannot refill this.  Discharge home.  Benjiman Core, MD 01/18/17 [phone]

## 2017-01-18 NOTE — ED Notes (Signed)
Pt reports "I was never suicidal, I just said that to get into the hospital. I had abdominal surgery, and needed to have dressing changes done. I had home health, during the week, but not on the weekends. I felt like I needed more help, and I was having back pain, and trouble with my breathing".

## 2017-01-18 NOTE — ED Notes (Signed)
Removed O2 from pt per doctor.

## 2017-01-18 NOTE — ED Notes (Signed)
Pt unable to maintain O2 stats above 90 while not on O2, not on O2 at home.  Pt ambulated to the bathroom when she returned O2 stats were in the low 80s.  Provider notified.

## 2017-01-18 NOTE — ED Provider Notes (Addendum)
Patient is noted to be hypoxic without supplemental oxygen.  She is approximately 3 weeks postop laparotomy, which would put her at risk for pulmonary embolism.  It is also a timeframe in which d-dimer is likely to be elevated.  She will be sent for CT angiogram to make sure she is not hypoxic because of pulmonary emboli.   Dione Booze, MD 01/18/17 0600  CT scan is still pending.  Case is signed out to Dr. Ranae Palms.   Dione Booze, MD 01/18/17 [phone]

## 2017-01-18 NOTE — ED Notes (Signed)
Pt wanting to know when she will be reassessed this morning. Made aware it will be today, not sure exact time. Also, wants RN to know that she has a lot of responsibility at home (her car needs a new alternator, she has 5 kids, and she has to make her house payment).
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
## 2017-01-18 NOTE — ED Notes (Signed)
All pt belongings returned to pt upon discharge, including a duffel bag and belongings bag. Escorted to waiting family in a wheelchair by Maralyn Sago, Charity fundraiser. Pt departed in NAD.

## 2017-01-18 NOTE — Progress Notes (Signed)
Per Warden Fillers, the patient does not meet criteria for inpatient treatment. The patient is recommended for discharge and to follow up with her current outpatient provider.   The patient is psychiatrically cleared at this time.   Margie Ege, RN notified   Baldo Daub MSW, LCSWA CSW Disposition [phone]

## 2017-01-18 NOTE — ED Notes (Signed)
Patient transported to CT 

## 2017-01-18 NOTE — ED Notes (Signed)
Pt woke up,  placed on a regular hospital bed for comfort.  Denies any concerns or discomforts at this time.  Sitter bedside, will continue to monitor.

## 2017-01-18 NOTE — ED Notes (Signed)
Pt ambulated to the bathroom w/o any difficulty 

## 2017-01-18 NOTE — ED Notes (Signed)
Pt calling out asking for [place] pete, diet coke, splenda, and ketchup for her breakfast tray.

## 2017-01-18 NOTE — ED Notes (Signed)
Regular diet lunch tray ordered @ 1050 - chicken and dumplings, w/ side salad - balsamic or italian dressing, asparagus, unsweetened tea.

## 2017-01-18 NOTE — ED Notes (Addendum)
RN in the room with patient for 15 mins to have long conversation with patient about plan. It is very difficult to reason with patient, and she does not listen. "I am not suicidal. I made a mistake, and I am sorry. I have to get out of here. I have things to do". Pt no longer wishes to go to SNF for rehab. "I am conflicted, because I have stuff to do at home, and I am very busy. RN turned to wash hands , " you are not very therapeutic. 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 Spoke with assessment team, they report she will be re-evaluated this am, before noon today. Pt made aware of plan. Also, keeps asking if an ED doctor will give her a script for her Vyvance, because she broke her medication contract with her doctor.

## 2017-01-18 NOTE — ED Notes (Signed)
Pt requesting Vyvance be given. When in hospital started on "small" dose of 20 mg daily in hospital. When d/c, not given rx, and didn't have a way to get it. Would like to have her med ordered here. Has appointment with family medicine tomorrow.

## 2017-01-18 NOTE — Consult Note (Signed)
Telepsych Consultation   Reason for Consult: SI Referring Physician: Margarita Mail, PA-C Location of Patient: [facility] ED Location of Provider: [facility]  [name] MRN:  [id number] Principal Diagnosis: <principal problem not specified> Diagnosis:   Patient Active Problem List   Diagnosis Date Noted  . Abdominal wall cellulitis [X48.016] 12/30/2016  . S/P exploratory laparotomy [Z98.890] 12/30/2016  . Distal radius fracture, left [S52.502A] 09/09/2016  . Thrombocytopenia ([place]) [D69.6] 09/09/2016  . Chest x-ray abnormality [R93.89]   . Umbilical hernia without obstruction and without gangrene [K42.9] 05/12/2016  . Itching [L29.9] 02/24/2016  . Ventral hernia without obstruction or gangrene [K43.9] 02/24/2016  . Palliative care encounter [Z51.5]   . Ascites [R18.8]   . Tachypnea [R06.82]   . Typical atrial flutter (HCC) [I48.3]   . Hypomagnesemia [E83.42]   . Hypoxia [R09.02]   . Dehydration [E86.0] 01/22/2016  . Low back ache [M54.5] 12/30/2015  . Non-intractable vomiting with nausea [R11.2]   . Alcohol abuse with alcohol-induced mood disorder ([place]) [F10.14] 12/15/2015  . Acute alcoholism ([place]) [F10.20]   . Abdominal distension [R14.0] 11/04/2015  . Alcohol abuse [F10.10]   . Dyspnea [R06.00]   . Acute respiratory failure ([place]) [J96.00] 10/15/2015  . Ascites due to alcoholic cirrhosis ([place]) [P53.74] 10/15/2015  . Osteoporosis [M81.0]   . Bipolar I disorder ([place]) [F31.9]   . Multiple sclerosis ([place]) [G35]   . Alcoholic cirrhosis of liver with ascites ([place]) [K70.31] 08/22/2015  . Bipolar disorder, current episode mixed, moderate ([place]) [F31.62] 08/01/2015  . Alcohol use disorder, severe, dependence ([place]) [F10.20] 07/31/2015  . Macrocytic anemia- due to alcohol abuse with normal B12 & folate levels [D53.9] 05/31/2015  . Severe protein-calorie malnutrition ([place]) [E43] 05/31/2015  . Hypokalemia [E87.6] 05/26/2015  . Encephalopathy,  hepatic ([place]) [K72.90] 05/26/2015  . Alcohol withdrawal ([place]) [F10.239] 05/18/2015  . Abdominal pain [R10.9] 05/18/2015  . Anxiety [F41.9] 05/18/2015  . UTI (lower urinary tract infection) [N39.0] 05/18/2015  . Stimulant abuse ([place]) [F15.10] 12/27/2014  . Nicotine abuse [Z72.0] 12/27/2014  . Hyperprolactinemia ([place]) [E22.1] 11/15/2014  . Dyslipidemia [E78.5]   . Tobacco abuse [Z72.0] 01/23/2014  . Noncompliance with therapeutic plan [Z91.11] 04/04/2013  . Unspecified hereditary and idiopathic peripheral neuropathy [G60.9] 05/01/2012  . GERD (gastroesophageal reflux disease) [K21.9] 05/01/2012  . Osteoarthrosis, unspecified whether generalized or localized, involving lower leg [M17.10] 05/01/2012  . Hyponatremia [E87.1] 07/01/2011    Total Time spent with patient: 30 minutes  Subjective:   Anne Black is a 56 y.o. female patient admitted with GAD.  HPI: Per the TTS assessment completed on 01/17/17 by Marisa Cyphers: Anne Black is a 56 y.o. female who presents to [facility] due to abdominal pain resulting from a surgery she had @ a week ago. Pt was scheduled to go to a rehab facility from the hospital, but decided to go home instead. In hindsight, pt realizes that was a wrong decision. Pt has had many trials dealing with her recovery including not having pain mgmt, due to the pain medication giving her headaches, and many other issues. Pt spoke to clinician, at length about these things. Regarding feeling suicidal, pt said, "truth be told, a friend said 'they'll definitely admit you if you say you're suicidal'". Pt indicated that she wants to be admitted to the medical floor, and hopefully moved to a rehab facility from there. Pt denies any SI, HI, or AVH. Pt also indicates that she has run out of (or lost) her Prozac and Vyvanse and  would like to get them refilled.   On Exam: Patient was seen via tele-psych, chart reviewed with treatment team. Patient in bed, awake, alert and oriented x4.  Patient reiterated the reason for this hospital admission as documented above. Patient stated, "I'm again apologizing for saying that I was suicidal. I didn't know what I was thinking, my friend told me to say that and they'll be admitted faster". Patient continued to report that she was told by her friends to say that she is suicidal in order to get admitted faster to the hospital. Patient denies any suicides in homicidal ideation. She stated that she was just in pain at home and that she was overwhelmed weeks housework after being hospitalized for 14 days at [facility] for hernia surgery. Patient stated that she was not able to obtain her pain medication, so she had to return back to the hospital as a result of the pain. Patient denies any depression at this time, she said in general, her depression is well managed at 3 out of 10 by her primary care physician. Patient denies any auditory and visual hallucinations. She reports 2 previous psych hospitalization for alcohol treatments. Patient reports protective factors of having 3 out of her 5 children living nearby and always being available to come to her aid. Patient stated that her pain is well managed at this time and that she will be able to obtain some pain medication when she goes home. During this assessment, patient does not appear to be responding to internal stimuli. Patient contracts for safety upon discharge.   Past Psychiatric History: As in H&P  Risk to Self: Suicidal Ideation: No Suicidal Intent: No Is patient at risk for suicide?: No Suicidal Plan?: No Access to Means: No Intentional Self Injurious Behavior: None Risk to Others: Homicidal Ideation: No Thoughts of Harm to Others: No Current Homicidal Intent: No Current Homicidal Plan: No Access to Homicidal Means: No History of harm to others?: No Assessment of Violence: None Noted Does patient have access to weapons?: No Criminal Charges Pending?: No Does patient have a court  date: No Prior Inpatient Therapy: Prior Inpatient Therapy: Yes Prior Therapy Dates: 2017 Prior Therapy Facilty/Provider(s): [facility] Reason for Treatment: Bipolar D/O, Alcohol Use Prior Outpatient Therapy: Prior Outpatient Therapy: Yes Prior Therapy Facilty/Provider(s): Pt stated "Dr Doreen Beam, [facility]" Does patient have an ACCT team?: No Does patient have Intensive In-House Services?  : No Does patient have [name] services? : No Does patient have P4CC services?: No  Past Medical History:  Past Medical History:  Diagnosis Date  . ADHD (attention deficit hyperactivity disorder)   . Anxiety   . Arthritis   . Ascites   . Bipolar disorder ([place])   . Cirrhosis ([place])   . Hypokalemia   . Migraine   . Multiple sclerosis ([place])   . Osteoporosis     Past Surgical History:  Procedure Laterality Date  . ABDOMINAL WALL DEFECT REPAIR N/A 12/30/2016   Procedure: EXPLORATORY LAPAROTOMY WITH REPAIR ABDOMINAL WALL VENTRAL HERNIA;  Surgeon: Rolm Bookbinder, MD;  Location: [facility];  Service: General;  Laterality: N/A;  . APPLICATION OF WOUND VAC N/A 12/30/2016   Procedure: APPLICATION OF WOUND VAC;  Surgeon: Rolm Bookbinder, MD;  Location: [place] [facility];  Service: General;  Laterality: N/A;  . CESAREAN SECTION  [phone]  . FRACTURE SURGERY    . HERNIA REPAIR    . MYRINGOTOMY WITH TUBE PLACEMENT Bilateral   . ORIF WRIST FRACTURE Left 09/09/2016  Procedure: OPEN REDUCTION INTERNAL FIXATION (ORIF) LEFT WRIST FRACTURE, LEFT CARPAL TUNNEL RELEASE;  Surgeon: Roseanne Kaufman, MD;  Location: [facility];  Service: Orthopedics;  Laterality: Left;  . TONSILLECTOMY     Family History:  Family History  Problem Relation Age of Onset  . Arrhythmia Mother   . Heart disease Father   . Hypertension Father    Family Psychiatric  History: Unknown  Social History:  Social History   Substance and Sexual Activity  Alcohol Use Yes  . Alcohol/week: 12.6 oz  . Types: 21 Glasses of wine per week      Social History   Substance and Sexual Activity  Drug Use No    Social History   Socioeconomic History  . Marital status: Divorced    Spouse name: n/a  . Number of children: 5  . Years of education: College  . Highest education level: None  Social Needs  . Financial resource strain: None  . Food insecurity - worry: None  . Food insecurity - inability: None  . Transportation needs - medical: None  . Transportation needs - non-medical: None  Occupational History  . Occupation: disabled    Comment: on social security disability for MS  Tobacco Use  . Smoking status: Current Every Day Smoker    Packs/day: 1.00    Years: 20.00    Pack years: 20.00    Types: Cigarettes  . Smokeless tobacco: Never Used  . Tobacco comment: Currently Vaping Daily  Substance and Sexual Activity  . Alcohol use: Yes    Alcohol/week: 12.6 oz    Types: 21 Glasses of wine per week  . Drug use: No  . Sexual activity: Not Currently  Other Topics Concern  . None  Social History Narrative   Former Teaching laboratory technician.   Started graduate school in counseling, but didn't finish when a good job came open.   Lives alone.   One daughter lives in [place].   Another daughter is in [place].   The other three live in [place], along with her mother.   Father lives in the [place], [place] area.   Additional Social History:    Allergies:  No Known Allergies  Labs:  Results for orders placed or performed during the hospital encounter of 01/17/17 (from the past 48 hour(s))  Comprehensive metabolic panel     Status: Abnormal   Collection Time: 01/17/17  2:36 PM  Result Value Ref Range   Sodium 136 135 - 145 mmol/L   Potassium 3.2 (L) 3.5 - 5.1 mmol/L   Chloride 96 (L) 101 - 111 mmol/L   CO2 30 22 - 32 mmol/L   Glucose, Bld 105 (H) 65 - 99 mg/dL   BUN <5 (L) 6 - 20 mg/dL   Creatinine, Ser 0.52 0.44 - 1.00 mg/dL   Calcium 8.8 (L) 8.9 - 10.3 mg/dL   Total Protein 6.6 6.5 - 8.1 g/dL   Albumin  3.3 (L) 3.5 - 5.0 g/dL   AST 29 15 - 41 U/L   ALT 16 14 - 54 U/L   Alkaline Phosphatase 125 38 - 126 U/L   Total Bilirubin 0.9 0.3 - 1.2 mg/dL   GFR calc non Af Amer >60 >60 mL/min   GFR calc Af Amer >60 >60 mL/min    Comment: (NOTE) The eGFR has been calculated using the CKD EPI equation. This calculation has not been validated in all clinical situations. eGFR's persistently <60 mL/min signify possible Chronic Kidney Disease.    Anion gap 10  5 - 15  Ethanol     Status: None   Collection Time: 01/17/17  2:36 PM  Result Value Ref Range   Alcohol, Ethyl (B) <10 <10 mg/dL    Comment:        LOWEST DETECTABLE LIMIT FOR SERUM ALCOHOL IS 10 mg/dL FOR MEDICAL PURPOSES ONLY <56   Salicylate level     Status: None   Collection Time: 01/17/17  2:36 PM  Result Value Ref Range   Salicylate Lvl <3.8 2.8 - 30.0 mg/dL  Acetaminophen level     Status: Abnormal   Collection Time: 01/17/17  2:36 PM  Result Value Ref Range   Acetaminophen (Tylenol), Serum <10 (L) 10 - 30 ug/mL    Comment:        THERAPEUTIC CONCENTRATIONS VARY SIGNIFICANTLY. A RANGE OF 10-30 ug/mL MAY BE AN EFFECTIVE CONCENTRATION FOR MANY PATIENTS. HOWEVER, SOME ARE BEST TREATED AT CONCENTRATIONS OUTSIDE THIS RANGE. ACETAMINOPHEN CONCENTRATIONS >150 ug/mL AT 4 HOURS AFTER INGESTION AND >50 ug/mL AT 12 HOURS AFTER INGESTION ARE OFTEN ASSOCIATED WITH TOXIC REACTIONS. <10   cbc     Status: Abnormal   Collection Time: 01/17/17  2:36 PM  Result Value Ref Range   WBC 8.2 4.0 - 10.5 K/uL   RBC 3.98 3.87 - 5.11 MIL/uL   Hemoglobin 11.0 (L) 12.0 - 15.0 g/dL   HCT 36.1 36.0 - 46.0 %   MCV 90.7 78.0 - 100.0 fL   MCH 27.6 26.0 - 34.0 pg   MCHC 30.5 30.0 - 36.0 g/dL   RDW 14.9 11.5 - 15.5 %   Platelets 219 150 - 400 K/uL  Rapid urine drug screen (hospital performed)     Status: None   Collection Time: 01/17/17  2:38 PM  Result Value Ref Range   Opiates NONE DETECTED NONE DETECTED   Cocaine NONE DETECTED NONE DETECTED    Benzodiazepines NONE DETECTED NONE DETECTED   Amphetamines NONE DETECTED NONE DETECTED   Tetrahydrocannabinol NONE DETECTED NONE DETECTED   Barbiturates NONE DETECTED NONE DETECTED    Comment:        DRUG SCREEN FOR MEDICAL PURPOSES ONLY.  IF CONFIRMATION IS NEEDED FOR ANY PURPOSE, NOTIFY LAB WITHIN 5 DAYS.        LOWEST DETECTABLE LIMITS FOR URINE DRUG SCREEN Drug Class       Cutoff (ng/mL) Amphetamine      1000 Barbiturate      200 Benzodiazepine   756 Tricyclics       433 Opiates          300 Cocaine          300 THC              50   I-Stat beta hCG blood, ED     Status: None   Collection Time: 01/17/17  2:52 PM  Result Value Ref Range   I-stat hCG, quantitative <5.0 <5 mIU/mL   Comment 3            Comment:   GEST. AGE      CONC.  (mIU/mL)   <=1 WEEK        5 - 50     2 WEEKS       50 - 500     3 WEEKS       100 - 10,000     4 WEEKS     1,000 - 30,000        FEMALE AND NON-PREGNANT FEMALE:     LESS  THAN 5 mIU/mL   Lipase, blood     Status: None   Collection Time: 01/17/17  5:07 PM  Result Value Ref Range   Lipase 29 11 - 51 U/L  Ammonia     Status: Abnormal   Collection Time: 01/17/17  5:07 PM  Result Value Ref Range   Ammonia 43 (H) 9 - 35 umol/L    Medications:  Current Facility-Administered Medications  Medication Dose Route Frequency Provider Last Rate Last Dose  . albuterol (PROVENTIL HFA;VENTOLIN HFA) 108 (90 Base) MCG/ACT inhaler 2 puff  2 puff Inhalation Q6H PRN Margarita Mail, PA-C   2 puff at 01/18/17 0537  . alum & mag hydroxide-simeth (MAALOX/MYLANTA) 200-200-20 MG/5ML suspension 30 mL  30 mL Oral Q6H PRN Harris, Abigail, PA-C      . busPIRone (BUSPAR) tablet 15 mg  15 mg Oral BID Margarita Mail, PA-C   15 mg at 01/18/17 0959  . FLUoxetine (PROZAC) capsule 20 mg  20 mg Oral Daily Harris, Abigail, PA-C   20 mg at 01/18/17 0957  . furosemide (LASIX) tablet 40 mg  40 mg Oral Daily Margarita Mail, PA-C   40 mg at 01/18/17 0956  . gabapentin  (NEURONTIN) capsule 400 mg  400 mg Oral TID Margarita Mail, PA-C   400 mg at 01/18/17 0957  . ibuprofen (ADVIL,MOTRIN) tablet 600 mg  600 mg Oral Q8H PRN Margarita Mail, PA-C   600 mg at 01/18/17 0533  . ondansetron (ZOFRAN) tablet 4 mg  4 mg Oral Q8H PRN Harris, Abigail, PA-C      . pantoprazole (PROTONIX) EC tablet 40 mg  40 mg Oral Daily Harris, Abigail, PA-C   40 mg at 01/18/17 0957  . potassium chloride SA (K-DUR,KLOR-CON) CR tablet 40 mEq  40 mEq Oral F4F Delora Fuel, MD   40 mEq at 01/18/17 0956  . risperiDONE (RISPERDAL) tablet 0.5 mg  0.5 mg Oral BID Margarita Mail, PA-C   0.5 mg at 01/18/17 0957  . spironolactone (ALDACTONE) tablet 100 mg  100 mg Oral Daily Margarita Mail, PA-C   100 mg at 01/18/17 0958  . SUMAtriptan (IMITREX) tablet 50 mg  50 mg Oral Q2H PRN Harris, Abigail, PA-C      . zolpidem (AMBIEN) tablet 5 mg  5 mg Oral QHS PRN Margarita Mail, PA-C   5 mg at 01/17/17 2059   Current Outpatient Medications  Medication Sig Dispense Refill  . albuterol (PROVENTIL HFA;VENTOLIN HFA) 108 (90 Base) MCG/ACT inhaler Inhale 2 puffs into the lungs every 6 (six) hours as needed for wheezing or shortness of breath.    . busPIRone (BUSPAR) 15 MG tablet TAKE 1 TABLET(15 MG) BY MOUTH TWICE DAILY (Patient taking differently: Take 15 mg by mouth two times a day) 60 tablet 0  . FLUoxetine (PROZAC) 20 MG capsule Take 20 mg by mouth daily.     . furosemide (LASIX) 40 MG tablet Take 1 tablet (40 mg total) by mouth daily. 30 tablet 5  . gabapentin (NEURONTIN) 400 MG capsule Take 1 capsule (400 mg total) 3 (three) times daily by mouth. 90 capsule 0  . Melatonin 3 MG TABS Take 1 tablet (3 mg total) by mouth at bedtime as needed (sleep). 30 tablet 0  . modafinil (PROVIGIL) 200 MG tablet Take 200 mg by mouth daily.  1  . ondansetron (ZOFRAN) 4 MG tablet Take 1 tablet (4 mg total) every 8 (eight) hours as needed by mouth for nausea or vomiting. 30 tablet 0  . pantoprazole (PROTONIX)  40 MG tablet Take  1 tablet (40 mg total) by mouth daily. 30 tablet 3  . risperiDONE (RISPERDAL) 0.5 MG tablet Take 1 tablet (0.5 mg total) by mouth 3 (three) times daily as needed (agitation). (Patient taking differently: Take 0.5 mg by mouth 2 (two) times daily. ) 30 tablet 0  . rizatriptan (MAXALT) 10 MG tablet Take 1 tablet (10 mg total) by mouth as needed for migraine. May repeat in 2 hours if needed 10 tablet 0  . spironolactone (ALDACTONE) 100 MG tablet Take 1 tablet (100 mg total) by mouth daily. 30 tablet 1  . topiramate (TOPAMAX) 100 MG tablet Take 1 tablet (100 mg total) by mouth daily. 30 tablet 0  . Fluticasone-Salmeterol (ADVAIR) 100-50 MCG/DOSE AEPB Inhale 1-2 puffs 2 (two) times daily into the lungs.     Marland Kitchen oxyCODONE (OXY IR/ROXICODONE) 5 MG immediate release tablet Take one tablet prior to dressing changes three times per week for pain. (Patient not taking: Reported on 01/17/2017) 9 tablet 0  . potassium chloride 20 MEQ/15ML (10%) SOLN Take 30 mLs (40 mEq total) by mouth 2 (two) times daily. 60 mL 0  . rifaximin (XIFAXAN) 550 MG TABS tablet Take 1 tablet (550 mg total) by mouth 2 (two) times daily. (Patient not taking: Reported on 01/17/2017) 60 tablet 0    Musculoskeletal: UTA via camera  Psychiatric Specialty Exam: Physical Exam  Nursing note and vitals reviewed.   Review of Systems  Psychiatric/Behavioral: Negative for depression, hallucinations, substance abuse and suicidal ideas. The patient is not nervous/anxious and does not have insomnia.   All other systems reviewed and are negative.   Blood pressure (!) 98/49, pulse 85, temperature 97.9 F (36.6 C), temperature source Oral, resp. rate 16, height _0  (1.575 m), weight 80.3 kg (177 lb), SpO2 93 %.Body mass index is 32.37 kg/m.  General Appearance: on hospital scrubs  Eye Contact:  Good  Speech:  Clear and Coherent and Normal Rate  Volume:  Normal  Mood:  Euthymic  Affect:  Appropriate and Congruent  Thought Process:  Coherent  and Goal Directed  Orientation:  Full (Time, Place, and Person)  Thought Content:  WDL and Logical  Suicidal Thoughts:  No  Homicidal Thoughts:  No  Memory:  Immediate;   Good Recent;   Good Remote;   Fair  Judgement:  Good  Insight:  Good  Psychomotor Activity:  Normal  Concentration:  Concentration: Good and Attention Span: Good  Recall:  Good  Fund of Knowledge:  Good  Language:  Good  Akathisia:  Negative  Handed:  Right  AIMS (if indicated):     Assets:  Communication Skills Desire for Improvement Financial Resources/Insurance Housing Leisure Time Resilience  ADL's:  Intact  Cognition:  WNL  Sleep:        Treatment Plan Summary: Plan to discharge home Follow up with Social Work consult for Care coordination Take all medications as prescribed Stay well hydrated Activity as tolerated Follow up with PCP for any new or existing medical concerns  Disposition: No evidence of imminent risk to self or others at present.   Patient does not meet criteria for psychiatric inpatient admission. Supportive therapy provided about ongoing stressors. Refer to IOP. Discussed crisis plan, support from social network, calling 911, coming to the Emergency Department, and calling Suicide Hotline.  This service was provided via telemedicine using a 2-way, interactive audio and video technology.  Names of all persons participating in this telemedicine service and their role in this  encounter. Name: Thana Ates Role: Patient  Name: Justina A. Okonkwo  Role: NP           Vicenta Aly, NP 01/18/2017 1:13 PM

## 2017-01-18 NOTE — ED Notes (Addendum)
Pt's daughters are coming to pick her up at 3 pm. Patient's belongings returned to her.

## 2017-01-18 NOTE — ED Notes (Signed)
Pt was provided with supplies to wash up with.

## 2017-01-18 NOTE — Discharge Instructions (Signed)
Follow-up with your doctors for further treatment.

## 2017-01-18 NOTE — ED Notes (Signed)
Pt off oxygen 93% RA.

## 2017-01-18 NOTE — ED Notes (Signed)
Pt wanted to walk around unit, pt was informed that she could walk to E43 and back to her room E48.

## 2017-01-18 NOTE — ED Notes (Signed)
Provided pt with diet coke to drink

## 2017-01-18 NOTE — ED Notes (Signed)
Pt requesting to speak with nurse

## 2017-01-18 NOTE — ED Notes (Addendum)
Per psych, she has been psych cleared. Dr. Rubin Payor made aware.

## 2017-01-18 NOTE — ED Notes (Addendum)
Assessment team calling this morning, mentioning likely reassessing pt this morning. Made aware that pt denies SI and reports making it up to get into the hospital. She would like to go to skilled rehab, while she recovers from her abdominal surgery. Is also wearing [place] O2, since this morning.

## 2017-01-18 NOTE — ED Notes (Addendum)
Pt's 2 daughters, husband arrived to visit her. Daughters came back first, then husband. Appropriate behavior. Pt is eating her meal tray. Pt called her daughters to come.

## 2017-01-21 ENCOUNTER — Telehealth: Payer: Self-pay | Admitting: Physician Assistant

## 2017-01-21 NOTE — Telephone Encounter (Signed)
Copied from CRM [id number]. Topic: Quick Communication - See Telephone Encounter >> Jan 21, 2017  3:33 PM Rudi Coco, NT wrote: CRM for notification. See Telephone encounter for:   01/21/17. Vernona Rieger called from [facility] home health to add social work to order. Dropped off paper work to be signed by Dr. Edwin Dada. Did let Vernona Rieger know about the 5-7 day paper turn around. Vernona Rieger can be reached at [phone]

## 2017-01-21 NOTE — Telephone Encounter (Signed)
Please express to the patient that I would not be refilling the ADHD medication which I have expressed in the office to her. I do not manage complicated cases.  I referred her to Psychiatry to help manage this.

## 2017-01-22 NOTE — Telephone Encounter (Signed)
Phone call to patient. Advised of message from Dr. Creta Levin. Patient became upset at this, states that she "can't believe we wasted her time" and "should have been straight up with her" if we could prescribe ADHD medication.   Patient ended call. Closing note.

## 2017-01-24 ENCOUNTER — Telehealth: Payer: Self-pay | Admitting: Surgery

## 2017-01-24 NOTE — Telephone Encounter (Signed)
Received page at 07:41 to call patient. Returned call x 2 with no answer.

## 2017-01-26 NOTE — Telephone Encounter (Signed)
Called Anne Black to resend paperwork but states that it had been taken care of.

## 2017-02-02 ENCOUNTER — Ambulatory Visit: Payer: Self-pay | Admitting: *Deleted

## 2017-02-02 NOTE — Telephone Encounter (Signed)
Patient states she has been having more frequent anxiety attacks- she thinks she needs a stronger medication for the attacks that she is having.Patient wants her ADHD and anxiety medication handled by her primary care provider. She has multiple problems and transportation is an issue for patient. Patient needs to see a provider that can meet her needs. Patient currently needs her medication for her ADHD and she needs to discuss her anxiety disorder.She is getting to crisis level- she is struggling with her MS and she is willing to try to go to a phychiatric but she needs help until she can get that referral into that office.  Called to the office and let them know.

## 2017-02-03 ENCOUNTER — Telehealth: Payer: Self-pay | Admitting: General Practice

## 2017-02-03 ENCOUNTER — Telehealth: Payer: Self-pay | Admitting: Physician Assistant

## 2017-02-03 DIAGNOSIS — F3177 Bipolar disorder, in partial remission, most recent episode mixed: Secondary | ICD-10-CM

## 2017-02-03 NOTE — Telephone Encounter (Signed)
Copied from CRM [id number]. Topic: Quick Communication - See Telephone Encounter >> Feb 03, 2017  4:56 PM Arlyss Gandy, NT wrote: CRM for notification. See Telephone encounter for: Pt would like a call about her medication needs that she spoke with a triage nurse yesterday. Please contact pt. She is very upset that she has not had a call back yet.   02/03/17.

## 2017-02-03 NOTE — Telephone Encounter (Signed)
Pt. called to discuss her concerns and needs.  Reported she has been on Vyvanse and needs to get this refilled.  The pt. reported that she has never heard from Psychiatry for an appt.  Stated she had emergency surgery on her abdomen 3 weeks ago, and has an open wound that she is trying to take care of; stated "I am not driving."  Also said "I'm not handling things well."  Pt. was crying, stated "I'm so tired, and things are hard."  Reported she has anxiety issues.  The pt. described having panic attacks that are "God awful."  Stated she has had to rely on her 61 yr. old mother to take her places, and this is very difficult.      Offered to contact [facility], on her behalf to inquire if they had tried to contact her for an appt.  The pt. said "did you hear me, I don't drive!"   Advised pt. she needs to follow through and see Psychiatry for management of her ADHD.  The pt. stated "isn't there a doctor there that isn't so rigid?  Advised the pt. I will make Dr. Creta Levin aware of her phone call.  Also, advised I will contact [facility] tomorrow to f/u on the referral.  The pt. hung up the phone after I told her that was all I could do to help her, at this time.

## 2017-02-04 NOTE — Telephone Encounter (Signed)
In speaking with pt. 12/27, she stated she has never heard from Psychiatry.  Followed up on the Psychiatric referral from 11/2016.  Noted in chart that the office the referral was made to, has documented the pt. was dismissed from their practice.  Can a referral to another Psychiatric practice be made?

## 2017-02-04 NOTE — Telephone Encounter (Signed)
Called and spoke to pt this Morning about concerns and medication and informed her that she would have to make an appointment with her PCP for these types of medications. During call the line was disconnected and I tried to call back but no answer. Will call pt again later.

## 2017-02-05 ENCOUNTER — Telehealth: Payer: Self-pay | Admitting: General Surgery

## 2017-02-05 NOTE — Telephone Encounter (Signed)
Pt called stating she couldn't take care of herself at home and wanted to be admitted to [facility].  I told her that I could not arrange that over the weekend.  She denies any nausea, vomiting or fevers. She is tolerating a diet.  She complains of abdominal pain after eating.  I recommended that she proceed to the ED if her abdominal pain returned or worsened or she developed fevers.  Otherwise, our office will contact her on Mon to check on her.    Vanita Panda, MD  Colorectal and General Surgery [facility]

## 2017-02-07 ENCOUNTER — Telehealth: Payer: Self-pay | Admitting: General Practice

## 2017-02-07 NOTE — Telephone Encounter (Signed)
Copied from CRM [id number]. Topic: Quick Communication - See Telephone Encounter >> Feb 07, 2017  4:58 PM Everardo Pacific, [place] wrote: CRM for notification. See Telephone encounter for: Patient calling because would like to know if she could have some Zofran called into the pharmacy. If someone could give her a call back at [phone]  02/07/17.

## 2017-02-09 NOTE — Telephone Encounter (Signed)
Called pt. To discuss her request for refill on Zofran.  After explaining to her, that I was calling from the [facility] on behalf of her PCP's office, the pt. verb. feeling very frustrated with this office.  Very adamant that she has been seen in the office since 11/10/2016.  Stated she has been trying to get her Vyvanse refilled without success.  Reported she is "tired of having these conversations over and over."  Also, stated she is trying to get into an extended care facility, as she is unable to care for herself.  Stated "I made a mistake when I was offered that, before I was discharged from the hospital, in December."  Reported she had a conversation with someone yesterday that advised her she would need to be in the hospital for 3 days, and evaluated for need to be placed in an extended care facility.  The pt. stated she was tired of trying to get the care she needs and being told she has to do one more thing.  Attempted to acknowledge her feelings,  and to re-explain the process of being admitted to an extended care facility.  After 31 min. in conversation with the pt.,  She stated "I can't talk about this with you any longer...goodbye."  The pt. hung up the phone.  No discussion was held re: the Zofran, during this call.

## 2017-02-16 NOTE — Telephone Encounter (Signed)
Pt requesting refill of Risperdal.

## 2017-02-16 NOTE — Telephone Encounter (Signed)
Pt needs and OV and to decide a primary care

## 2017-02-16 NOTE — Telephone Encounter (Signed)
Copied from CRM [id number]. Topic: Quick Communication - Rx Refill/Question >> Feb 16, 2017  4:26 PM Guinevere Ferrari, NT wrote: Medication: risperiDONE (RISPERDAL) 0.5 MG tablet   Has the patient contacted their pharmacy? yes   (Agent: If no, request that the patient contact the pharmacy for the refill.)   Preferred Pharmacy (with phone number or street name): [id number] - [place], [place] - [address] [phone] (Phone) [phone] (Fax)     Agent: Please be advised that RX refills may take up to 3 business days. We ask that you follow-up with your pharmacy.

## 2017-02-16 NOTE — Telephone Encounter (Signed)
Patient called very upset.  She said no one has called her back to discuss what she previously talked about with the nurse. She needs her medications called in. She said it's going to take to get in to see a psychiatrist and she needs her medication refilled now.

## 2017-02-17 ENCOUNTER — Telehealth: Payer: Self-pay | Admitting: Urgent Care

## 2017-02-17 NOTE — Telephone Encounter (Signed)
Left message with pt.'s [facility] that she needs to schedule an office visit if she wants refills.

## 2017-02-17 NOTE — Telephone Encounter (Signed)
Patient is calling again about her medicine. She said someone just called her & she was ugly to her. She just keeps saying she is burning up bc she has MS & doesn't understand what kind of dr office we are. She keeps screaming and yelling at me. I advised patient she hasnt been seen since in October & dr advised her for an appt.

## 2017-02-17 NOTE — Telephone Encounter (Signed)
Copied from CRM [id number]. Topic: Quick Communication - Rx Refill/Question >> Feb 17, 2017  9:35 AM Anice Paganini I, NT wrote: Medication: Zofran 40 mg    Has the patient contacted their pharmacy yes   (Agent: If no, request that the patient contact the pharmacy for the refill pt   Preferred Pharmacy (with phone number or street name [facility] [address] [phone]   Agent: Please be advised that RX refills may take up to 3 business days. We ask that you follow-up with your pharmacy.

## 2017-02-17 NOTE — Telephone Encounter (Signed)
Copied from CRM [id number]. Topic: Quick Communication - Rx Refill/Question >> Feb 17, 2017  9:35 AM Anice Paganini I, NT wrote: Medication: Zofran 40 mg    Has the patient contacted their pharmacy yes   (Agent: If no, request that the patient contact the pharmacy for the refill pt   Preferred Pharmacy (with phone number or street name [facility] [address] [phone]   Agent: Please be advised that RX refills may take up to 3 business days. We ask that you follow-up with your pharmacy. >> Feb 17, 2017 11:35 AM Rudi Coco, NT wrote: Pt. Calling again at 11:23am and said that she shouldn't have to make an. Appt. I just made her aware of what note stated. Pt. Can be reached at [phone]

## 2017-02-17 NOTE — Telephone Encounter (Signed)
This is not a patient of [facility] please route accordingly

## 2017-02-17 NOTE — Telephone Encounter (Signed)
Copied from CRM [id number]. Topic: Inquiry >> Feb 17, 2017  9:44 AM Anice Paganini I, NT wrote: Reason for CRM: pt keep calling us every day and she asking for refill and she established  care with [facility] but she keep calling for [place] for Med Refills

## 2017-02-17 NOTE — Telephone Encounter (Signed)
Patient states she didn't think the Doctor at [facility] was equipped to handle her care.  Explained to patient that we ar unable to fill ADD meds per Dr. Creta Levin.  Patient became angry and started yelling through phone.  Told patient I would hang up and call back when she calmed down .  Patient hung up.            Dr. Creta Levin has informed patient she will not be filling her ADHD medication.   Zofran was not prescribed by [place].

## 2017-02-17 NOTE — Telephone Encounter (Signed)
Pt is calling back about medication -she states she can not understnd why she is having to wait when she is having nausea now.  She would like to have zofran and perm[facility]iate sent [facility] [place].   Pt also states that she needs her vyvanse.   Pt also would like explanation of why she can not get her vyvanse at the pcp location.

## 2017-02-18 ENCOUNTER — Telehealth: Payer: Self-pay | Admitting: Family Medicine

## 2017-02-18 NOTE — Telephone Encounter (Signed)
Talked to pt and she is complaining about nausea and not being able to get in to see a doctor. I explained that she woulod need to see a provider for an office visit in order to receive meds. She stated that she may go to the ED if she can get a ride and will call back if she can make an appt.

## 2017-02-18 NOTE — Telephone Encounter (Signed)
[facility] on [facility]

## 2017-02-18 NOTE — Telephone Encounter (Signed)
Referral placed to psychiatry.  Referrals: Please see which practice will take her as she has been dismissed from her previous practice.

## 2017-02-18 NOTE — Addendum Note (Signed)
Addended by: Collie Siad A on: 02/18/2017 10:23 AM   Modules accepted: Orders

## 2017-02-24 NOTE — Telephone Encounter (Signed)
Copied from CRM [id number]. Topic: General - Other >> Feb 24, 2017  3:16 PM Lelon Frohlich, [place] wrote: Reason for CRM: Pt called back and was upset that she could not speak to whom she had spoke to earlier(CRM was not in at the time) I tried everything that I could to appease this pt she wants something for pain and her zofran refilled and her medication for ADHD refilled. I read to pt the previous messages but she did not seem to understand or comprehend. I informed pt I would place another message for her

## 2017-02-26 NOTE — Telephone Encounter (Signed)
Pt needs OV 

## 2017-02-26 NOTE — Telephone Encounter (Signed)
Patient has been called multiple times and has refused to make an OV.

## 2017-02-28 ENCOUNTER — Telehealth: Payer: Self-pay | Admitting: Physician Assistant

## 2017-02-28 NOTE — Telephone Encounter (Signed)
Copied from CRM [id number]. Topic: Inquiry >> Feb 28, 2017  6:17 PM Stephannie Li, NT wrote: Reason for CRM: Patient is requesting a sedative for anxiety for  a limited time please advise patient states she has agoraphobia

## 2017-02-28 NOTE — Telephone Encounter (Signed)
Pt called about issues w/ anxiety, contact pt to advise, she is looking to medicate

## 2017-02-28 NOTE — Telephone Encounter (Signed)
Pt's calling complaining of  TMJ and is having severe pain in the left ear that radiates to chin, ear and neck. Pt stating she does not understand why she was not given any thing for pain by the PCP or ENT. Pt has been evaluated for the same type of pain before and was referred to a specialist but states she was not given any medication and was not pleased with the care provided. Pt offered to make appt, to address concerns but the pt refused and stated that she wanted to be called in a medication.  Pt advised to go to the ED for treatment of severe pain. Pt asking for medication to be called in by on-call provider. Advised pt again that in order to treat pain she would need to go to ED due to the office being closed at this time.

## 2017-03-01 ENCOUNTER — Telehealth: Payer: Self-pay | Admitting: Physician Assistant

## 2017-03-01 NOTE — Telephone Encounter (Signed)
Copied from CRM [id number]. Topic: Quick Communication - See Telephone Encounter >> Mar 01, 2017  2:09 PM Arlyss Gandy, NT wrote: CRM for notification. See Telephone encounter for: Pt states that she is unable to afford to go to an oral surgeon for her TMJ and would a physician to call her to discuss treatment and/or pain management.  03/01/17.

## 2017-03-01 NOTE — Telephone Encounter (Signed)
Pt has been advised many times that she needs to come in for an appointment to treat multiple medical concerns. Pt refuses to make a appointment and just wants to treated by phone. We've also explained needs to be seen for treatment. Routing so pt can make appointment.

## 2017-03-01 NOTE — Telephone Encounter (Signed)
Please see other message.

## 2017-03-02 ENCOUNTER — Telehealth: Payer: Self-pay | Admitting: Physician Assistant

## 2017-03-02 ENCOUNTER — Ambulatory Visit: Payer: Self-pay | Admitting: Neurology

## 2017-03-02 NOTE — Telephone Encounter (Signed)
Copied from CRM [id number]. Topic: Quick Communication - See Telephone Encounter >> Mar 02, 2017  1:29 PM Diana Eves B wrote: CRM for notification. See Telephone encounter for:  Pt is very nauseous and is requesting zofran be called in. Pt states she had surgery 4 weeks ago. Contact patient [phone].  03/02/17.

## 2017-03-02 NOTE — Telephone Encounter (Signed)
Tried to call pt to schedule an OV. Called cell phone # and it seems to be out of service. I tried to call home phone listed and there was no answer. The recording asked me to enter in my remote code so I could not lave a message.  If pt calls in, please make her an appt.  Thanks!

## 2017-03-07 ENCOUNTER — Emergency Department (HOSPITAL_COMMUNITY): Payer: 59

## 2017-03-07 ENCOUNTER — Inpatient Hospital Stay (HOSPITAL_COMMUNITY)
Admission: EM | Admit: 2017-03-07 | Discharge: 2017-03-11 | DRG: 896 | Disposition: A | Discharge status: Home Health | Payer: 59 | Attending: Internal Medicine | Admitting: Internal Medicine

## 2017-03-07 ENCOUNTER — Other Ambulatory Visit: Payer: Self-pay

## 2017-03-07 ENCOUNTER — Encounter (HOSPITAL_COMMUNITY): Payer: Self-pay

## 2017-03-07 DIAGNOSIS — Z91199 Patient's noncompliance with other medical treatment and regimen due to unspecified reason: Secondary | ICD-10-CM

## 2017-03-07 DIAGNOSIS — K7031 Alcoholic cirrhosis of liver with ascites: Secondary | ICD-10-CM | POA: Diagnosis present

## 2017-03-07 DIAGNOSIS — J9600 Acute respiratory failure, unspecified whether with hypoxia or hypercapnia: Secondary | ICD-10-CM | POA: Diagnosis present

## 2017-03-07 DIAGNOSIS — M810 Age-related osteoporosis without current pathological fracture: Secondary | ICD-10-CM | POA: Diagnosis present

## 2017-03-07 DIAGNOSIS — F1510 Other stimulant abuse, uncomplicated: Secondary | ICD-10-CM | POA: Diagnosis present

## 2017-03-07 DIAGNOSIS — F319 Bipolar disorder, unspecified: Secondary | ICD-10-CM | POA: Diagnosis not present

## 2017-03-07 DIAGNOSIS — Z9111 Patient's noncompliance with dietary regimen: Secondary | ICD-10-CM

## 2017-03-07 DIAGNOSIS — F1024 Alcohol dependence with alcohol-induced mood disorder: Secondary | ICD-10-CM | POA: Diagnosis present

## 2017-03-07 DIAGNOSIS — F10230 Alcohol dependence with withdrawal, uncomplicated: Secondary | ICD-10-CM | POA: Diagnosis not present

## 2017-03-07 DIAGNOSIS — J9601 Acute respiratory failure with hypoxia: Secondary | ICD-10-CM | POA: Diagnosis not present

## 2017-03-07 DIAGNOSIS — R0902 Hypoxemia: Secondary | ICD-10-CM

## 2017-03-07 DIAGNOSIS — F909 Attention-deficit hyperactivity disorder, unspecified type: Secondary | ICD-10-CM | POA: Diagnosis present

## 2017-03-07 DIAGNOSIS — G35 Multiple sclerosis: Secondary | ICD-10-CM | POA: Diagnosis present

## 2017-03-07 DIAGNOSIS — Z7951 Long term (current) use of inhaled steroids: Secondary | ICD-10-CM

## 2017-03-07 DIAGNOSIS — Z9119 Patient's noncompliance with other medical treatment and regimen: Secondary | ICD-10-CM

## 2017-03-07 DIAGNOSIS — R112 Nausea with vomiting, unspecified: Secondary | ICD-10-CM | POA: Diagnosis present

## 2017-03-07 DIAGNOSIS — Z23 Encounter for immunization: Secondary | ICD-10-CM

## 2017-03-07 DIAGNOSIS — F419 Anxiety disorder, unspecified: Secondary | ICD-10-CM | POA: Diagnosis present

## 2017-03-07 DIAGNOSIS — J441 Chronic obstructive pulmonary disease with (acute) exacerbation: Secondary | ICD-10-CM | POA: Diagnosis not present

## 2017-03-07 DIAGNOSIS — F10220 Alcohol dependence with intoxication, uncomplicated: Secondary | ICD-10-CM | POA: Diagnosis present

## 2017-03-07 DIAGNOSIS — K219 Gastro-esophageal reflux disease without esophagitis: Secondary | ICD-10-CM | POA: Diagnosis present

## 2017-03-07 DIAGNOSIS — F1014 Alcohol abuse with alcohol-induced mood disorder: Secondary | ICD-10-CM | POA: Diagnosis present

## 2017-03-07 DIAGNOSIS — J9811 Atelectasis: Secondary | ICD-10-CM | POA: Diagnosis present

## 2017-03-07 DIAGNOSIS — F10920 Alcohol use, unspecified with intoxication, uncomplicated: Secondary | ICD-10-CM

## 2017-03-07 DIAGNOSIS — Z79899 Other long term (current) drug therapy: Secondary | ICD-10-CM

## 2017-03-07 DIAGNOSIS — F17210 Nicotine dependence, cigarettes, uncomplicated: Secondary | ICD-10-CM | POA: Diagnosis present

## 2017-03-07 DIAGNOSIS — Y906 Blood alcohol level of 120-199 mg/100 ml: Secondary | ICD-10-CM | POA: Diagnosis present

## 2017-03-07 LAB — AMMONIA: Ammonia: 44 umol/L — ABNORMAL HIGH (ref 9–35)

## 2017-03-07 LAB — CBC WITH DIFFERENTIAL/PLATELET
BASOS ABS: 0 10*3/uL (ref 0.0–0.1)
BASOS PCT: 1 %
EOS ABS: 0 10*3/uL (ref 0.0–0.7)
Eosinophils Relative: 0 %
HEMATOCRIT: 34.3 % — AB (ref 36.0–46.0)
Hemoglobin: 10.5 g/dL — ABNORMAL LOW (ref 12.0–15.0)
Lymphocytes Relative: 26 %
Lymphs Abs: 1.3 10*3/uL (ref 0.7–4.0)
MCH: 27.1 pg (ref 26.0–34.0)
MCHC: 30.6 g/dL (ref 30.0–36.0)
MCV: 88.6 fL (ref 78.0–100.0)
MONOS PCT: 7 %
Monocytes Absolute: 0.4 10*3/uL (ref 0.1–1.0)
NEUTROS ABS: 3.3 10*3/uL (ref 1.7–7.7)
NEUTROS PCT: 66 %
Platelets: 167 10*3/uL (ref 150–400)
RBC: 3.87 MIL/uL (ref 3.87–5.11)
RDW: 16.9 % — ABNORMAL HIGH (ref 11.5–15.5)
WBC: 5 10*3/uL (ref 4.0–10.5)

## 2017-03-07 LAB — COMPREHENSIVE METABOLIC PANEL
ALT: 21 U/L (ref 14–54)
ANION GAP: 14 (ref 5–15)
AST: 51 U/L — ABNORMAL HIGH (ref 15–41)
Albumin: 3.2 g/dL — ABNORMAL LOW (ref 3.5–5.0)
Alkaline Phosphatase: 157 U/L — ABNORMAL HIGH (ref 38–126)
BUN: <5 mg/dL — ABNORMAL LOW (ref 6–20)
CO2: 27 mmol/L (ref 22–32)
CREATININE: 0.37 mg/dL — AB (ref 0.44–1.00)
Calcium: 7.6 mg/dL — ABNORMAL LOW (ref 8.9–10.3)
Chloride: 97 mmol/L — ABNORMAL LOW (ref 101–111)
Glucose, Bld: 89 mg/dL (ref 65–99)
Potassium: 3.4 mmol/L — ABNORMAL LOW (ref 3.5–5.1)
SODIUM: 138 mmol/L (ref 135–145)
TOTAL PROTEIN: 6.2 g/dL — AB (ref 6.5–8.1)
Total Bilirubin: 0.7 mg/dL (ref 0.3–1.2)

## 2017-03-07 LAB — RAPID URINE DRUG SCREEN, HOSP PERFORMED
Amphetamines: NOT DETECTED
BENZODIAZEPINES: NOT DETECTED
Barbiturates: NOT DETECTED
COCAINE: NOT DETECTED
OPIATES: NOT DETECTED
Tetrahydrocannabinol: NOT DETECTED

## 2017-03-07 LAB — LIPASE, BLOOD: LIPASE: 29 U/L (ref 11–51)

## 2017-03-07 LAB — I-STAT ARTERIAL BLOOD GAS, ED
ACID-BASE EXCESS: 5 mmol/L — AB (ref 0.0–2.0)
Bicarbonate: 29.6 mmol/L — ABNORMAL HIGH (ref 20.0–28.0)
O2 Saturation: 90 %
PH ART: 7.452 — AB (ref 7.350–7.450)
PO2 ART: 54 mmHg — AB (ref 83.0–108.0)
TCO2: 31 mmol/L (ref 22–32)
pCO2 arterial: 42.2 mmHg (ref 32.0–48.0)

## 2017-03-07 LAB — PROTIME-INR
INR: 1.18
Prothrombin Time: 14.9 seconds (ref 11.4–15.2)

## 2017-03-07 LAB — SALICYLATE LEVEL: Salicylate Lvl: <7 mg/dL (ref 2.8–30.0)

## 2017-03-07 LAB — ETHANOL: Alcohol, Ethyl (B): 181 mg/dL — ABNORMAL HIGH (ref <10)

## 2017-03-07 LAB — ACETAMINOPHEN LEVEL: Acetaminophen (Tylenol), Serum: <10 ug/mL — ABNORMAL LOW (ref 10–30)

## 2017-03-07 MED ORDER — PREDNISONE 20 MG PO TABS
30.0000 mg | ORAL_TABLET | Freq: Two times a day (BID) | ORAL | Status: DC
  Administered 2017-03-08 – 2017-03-10 (×4): 30 mg via ORAL
  Filled 2017-03-07: qty 1
  Filled 2017-03-07: qty 2
  Filled 2017-03-07 (×4): qty 1

## 2017-03-07 MED ORDER — ONDANSETRON HCL 4 MG PO TABS
4.0000 mg | ORAL_TABLET | Freq: Once | ORAL | Status: AC
Start: 2017-03-07 — End: 2017-03-07
  Administered 2017-03-07: 4 mg via ORAL
  Filled 2017-03-07: qty 1

## 2017-03-07 MED ORDER — ALBUTEROL SULFATE (2.5 MG/3ML) 0.083% IN NEBU: 3.0000 mL | INHALATION_SOLUTION | Freq: Four times a day (QID) | RESPIRATORY_TRACT | Status: DC | PRN

## 2017-03-07 MED ORDER — PIPERACILLIN-TAZOBACTAM 3.375 G IVPB: 3.3750 g | Freq: Three times a day (TID) | INTRAVENOUS | Status: DC

## 2017-03-07 MED ORDER — SODIUM CHLORIDE 0.9 % IV BOLUS (SEPSIS)
1000.0000 mL | Freq: Once | INTRAVENOUS | Status: AC
  Administered 2017-03-07: 1000 mL via INTRAVENOUS

## 2017-03-07 MED ORDER — METHYLPREDNISOLONE SODIUM SUCC 125 MG IJ SOLR
125.0000 mg | Freq: Once | INTRAMUSCULAR | Status: AC
  Administered 2017-03-07: 125 mg via INTRAVENOUS
  Filled 2017-03-07: qty 2

## 2017-03-07 MED ORDER — THIAMINE HCL 100 MG/ML IJ SOLN
100.0000 mg | Freq: Every day | INTRAMUSCULAR | Status: DC
  Administered 2017-03-08 (×2): 100 mg via INTRAVENOUS
  Filled 2017-03-07 (×2): qty 2

## 2017-03-07 MED ORDER — LORAZEPAM 2 MG/ML IJ SOLN
1.0000 mg | Freq: Once | INTRAMUSCULAR | Status: AC
  Administered 2017-03-07: 1 mg via INTRAVENOUS
  Filled 2017-03-07: qty 1

## 2017-03-07 MED ORDER — IPRATROPIUM-ALBUTEROL 0.5-2.5 (3) MG/3ML IN SOLN
3.0000 mL | Freq: Once | RESPIRATORY_TRACT | Status: AC
  Administered 2017-03-07: 3 mL via RESPIRATORY_TRACT
  Filled 2017-03-07: qty 3

## 2017-03-07 MED ORDER — FOLIC ACID 1 MG PO TABS
1.0000 mg | ORAL_TABLET | Freq: Every day | ORAL | Status: DC
  Administered 2017-03-08 – 2017-03-11 (×5): 1 mg via ORAL
  Filled 2017-03-07 (×4): qty 1

## 2017-03-07 MED ORDER — AZITHROMYCIN 500 MG IV SOLR
500.0000 mg | INTRAVENOUS | Status: DC
  Administered 2017-03-07: 500 mg via INTRAVENOUS
  Filled 2017-03-07: qty 500

## 2017-03-07 MED ORDER — ONDANSETRON HCL 4 MG PO TABS: 4.0000 mg | ORAL_TABLET | Freq: Once | ORAL | Status: DC

## 2017-03-07 MED ORDER — GI COCKTAIL ~~LOC~~
30.0000 mL | Freq: Once | ORAL | Status: AC
  Administered 2017-03-07: 30 mL via ORAL
  Filled 2017-03-07: qty 30

## 2017-03-07 MED ORDER — DEXTROSE 5 % IV SOLN
1.0000 g | INTRAVENOUS | Status: DC
  Administered 2017-03-08 – 2017-03-10 (×4): 1 g via INTRAVENOUS
  Filled 2017-03-07 (×5): qty 10

## 2017-03-07 MED ORDER — HALOPERIDOL LACTATE 5 MG/ML IJ SOLN
2.5000 mg | Freq: Once | INTRAMUSCULAR | Status: AC
  Administered 2017-03-07: 2.5 mg via INTRAVENOUS
  Filled 2017-03-07: qty 1

## 2017-03-07 MED ORDER — VITAMIN B-1 100 MG PO TABS
100.0000 mg | ORAL_TABLET | Freq: Every day | ORAL | Status: DC
  Administered 2017-03-09 – 2017-03-11 (×3): 100 mg via ORAL
  Filled 2017-03-07 (×4): qty 1

## 2017-03-07 MED ORDER — FUROSEMIDE 40 MG PO TABS
40.0000 mg | ORAL_TABLET | Freq: Every day | ORAL | Status: DC
  Administered 2017-03-08 – 2017-03-11 (×4): 40 mg via ORAL
  Filled 2017-03-07 (×5): qty 1

## 2017-03-07 MED ORDER — SODIUM CHLORIDE 0.9% FLUSH
3.0000 mL | Freq: Two times a day (BID) | INTRAVENOUS | Status: DC
  Administered 2017-03-07 – 2017-03-10 (×6): 3 mL via INTRAVENOUS

## 2017-03-07 MED ORDER — LORAZEPAM 2 MG/ML IJ SOLN: 1.0000 mg | Freq: Four times a day (QID) | INTRAMUSCULAR | Status: AC | PRN

## 2017-03-07 MED ORDER — PANTOPRAZOLE SODIUM 40 MG PO TBEC
40.0000 mg | DELAYED_RELEASE_TABLET | Freq: Every day | ORAL | Status: DC
  Administered 2017-03-08 – 2017-03-11 (×4): 40 mg via ORAL
  Filled 2017-03-07 (×4): qty 1

## 2017-03-07 MED ORDER — VANCOMYCIN HCL 10 G IV SOLR
1500.0000 mg | Freq: Once | INTRAVENOUS | Status: DC
  Filled 2017-03-07: qty 1500

## 2017-03-07 MED ORDER — SODIUM CHLORIDE 0.9 % IV SOLN: 250.0000 mL | INTRAVENOUS | Status: DC | PRN

## 2017-03-07 MED ORDER — VANCOMYCIN HCL IN DEXTROSE 1-5 GM/200ML-% IV SOLN: 1000.0000 mg | Freq: Three times a day (TID) | INTRAVENOUS | Status: DC

## 2017-03-07 MED ORDER — ADULT MULTIVITAMIN W/MINERALS CH
1.0000 | ORAL_TABLET | Freq: Every day | ORAL | Status: DC
  Administered 2017-03-08 – 2017-03-11 (×5): 1 via ORAL
  Filled 2017-03-07 (×5): qty 1

## 2017-03-07 MED ORDER — ONDANSETRON HCL 4 MG/2ML IJ SOLN
4.0000 mg | Freq: Once | INTRAMUSCULAR | Status: DC
  Filled 2017-03-07: qty 2

## 2017-03-07 MED ORDER — PIPERACILLIN-TAZOBACTAM 3.375 G IVPB 30 MIN: 3.3750 g | Freq: Once | INTRAVENOUS | Status: DC

## 2017-03-07 MED ORDER — LORAZEPAM 1 MG PO TABS
1.0000 mg | ORAL_TABLET | Freq: Four times a day (QID) | ORAL | Status: AC | PRN
  Administered 2017-03-08 – 2017-03-10 (×4): 1 mg via ORAL
  Filled 2017-03-07 (×4): qty 1

## 2017-03-07 MED ORDER — ONDANSETRON HCL 4 MG PO TABS
4.0000 mg | ORAL_TABLET | Freq: Three times a day (TID) | ORAL | Status: DC | PRN
  Administered 2017-03-07 – 2017-03-10 (×2): 4 mg via ORAL
  Filled 2017-03-07 (×2): qty 1

## 2017-03-07 MED ORDER — IPRATROPIUM-ALBUTEROL 0.5-2.5 (3) MG/3ML IN SOLN
3.0000 mL | Freq: Four times a day (QID) | RESPIRATORY_TRACT | Status: DC
  Administered 2017-03-08: 3 mL via RESPIRATORY_TRACT
  Filled 2017-03-07 (×2): qty 3

## 2017-03-07 MED ORDER — SODIUM CHLORIDE 0.9% FLUSH: 3.0000 mL | INTRAVENOUS | Status: DC | PRN

## 2017-03-07 MED ORDER — PANTOPRAZOLE SODIUM 40 MG IV SOLR
40.0000 mg | Freq: Once | INTRAVENOUS | Status: AC
  Administered 2017-03-07: 40 mg via INTRAVENOUS
  Filled 2017-03-07: qty 40

## 2017-03-07 MED ORDER — SPIRONOLACTONE 25 MG PO TABS
100.0000 mg | ORAL_TABLET | Freq: Every day | ORAL | Status: DC
  Administered 2017-03-08 – 2017-03-11 (×4): 100 mg via ORAL
  Filled 2017-03-07 (×2): qty 4
  Filled 2017-03-07: qty 1
  Filled 2017-03-07 (×2): qty 4

## 2017-03-07 MED ORDER — RIFAXIMIN 550 MG PO TABS
550.0000 mg | ORAL_TABLET | Freq: Two times a day (BID) | ORAL | Status: DC
  Administered 2017-03-08 – 2017-03-11 (×8): 550 mg via ORAL
  Filled 2017-03-07 (×9): qty 1

## 2017-03-07 NOTE — Progress Notes (Signed)
Pharmacy Antibiotic Note  Anne Black is a 57 y.o. female admitted on 03/07/2017 with pneumonia.  Pharmacy has been consulted for vancomycin and Zosyn dosing. Pt is afebrile. WBC wnl. Scr low at 0.37 with hx of malnutrition.  Plan: Vancomycin 1500mg  IV x1 Vancomycin 1000mg  IV q8h  Zosyn 3.375g IV x1 Zosyn 3.375g IV q8h (4 hour infusion) F/u renal fxn, trough @ SS, clinical resolution  Height: 5\' 2"  (157.5 cm) Weight: 177 lb 0.5 oz (80.3 kg) IBW/kg (Calculated) : 50.1  Temp (24hrs), Avg:97.8 F (36.6 C), Min:97.8 F (36.6 C), Max:97.8 F (36.6 C)  Recent Labs  Lab 03/07/17 1333  WBC 5.0  CREATININE 0.37*    Estimated Creatinine Clearance: 76.2 mL/min (A) (by C-G formula based on SCr of 0.37 mg/dL (L)).    No Known Allergies  Antimicrobials this admission: Vanc 1/28 >> Zosyn 1/28 >>  Dose adjustments this admission: None  Microbiology results: Pending  Thank you for allowing pharmacy to be a part of this patient's care.  Katherine Mantle, PharmD Candidate 03/07/2017 7:50 PM   I discussed / reviewed the pharmacy note by Ms. Lam and I agree with the student's findings and plans as documented.  Link Snuffer, PharmD, BCPS, BCCCP Clinical Pharmacist Clinical phone 03/07/2017 until 11PM [phone] After hours, please call #[phone] 03/07/2017, 8:08 PM

## 2017-03-07 NOTE — ED Notes (Signed)
Pt up to bedpan. Voids approx 300 cc.

## 2017-03-07 NOTE — ED Notes (Signed)
Pt iv is not functional.

## 2017-03-07 NOTE — H&P (Signed)
History and Physical    Anne Black ZOX:[id number] DOB: March 24, 1960 DOA: 03/07/2017  PCP: Patient, No Pcp Per  Patient coming from: Home  Chief Complaint: Nausea and vomiting along with stuttering along with shortness of breath and wheezing and cough  HPI: Anne Black is a 57 y.o. female with medical history significant of bipolar disorder, chronic alcohol abuse, cirrhosis of the liver, multiple sclerosis, ADHD, polysubstance abuse, comes in with stuttering along with nausea and vomiting and cough and shortness of breath.  Patient was in the process of being discharged when she got into an altercation with staff.  She was then noted to be short of breath and wheezing and hypoxic with O2 sats in the upper 80s.  Patient reports she has been told in the past she needs home oxygen.  She does not have home oxygen this is never been arranged.  She reports that she has been coughing.  She is still drinking daily.  She denies any fevers.  She is still smoking.  Patient denies any diarrhea.  Patient referred for COPD exacerbation and possible pneumonia.  She has been given vancomycin and Zosyn.  Chest x-ray does not show clear infiltrate.  She is afebrile here.  Review of Systems: As per HPI otherwise 10 point review of systems negative.   Past Medical History:  Diagnosis Date  . ADHD (attention deficit hyperactivity disorder)   . Anxiety   . Arthritis   . Ascites   . Bipolar disorder (HCC)   . Cirrhosis (HCC)   . Hypokalemia   . Migraine   . Multiple sclerosis (HCC)   . Osteoporosis     Past Surgical History:  Procedure Laterality Date  . ABDOMINAL WALL DEFECT REPAIR N/A 12/30/2016   Procedure: EXPLORATORY LAPAROTOMY WITH REPAIR ABDOMINAL WALL VENTRAL HERNIA;  Surgeon: [name], MD;  Location: [facility] OR;  Service: General;  Laterality: N/A;  . APPLICATION OF WOUND VAC N/A 12/30/2016   Procedure: APPLICATION OF WOUND VAC;  Surgeon: [name], MD;  Location: [facility] OR;  Service:  General;  Laterality: N/A;  . CESAREAN SECTION  [phone]  . FRACTURE SURGERY    . HERNIA REPAIR    . MYRINGOTOMY WITH TUBE PLACEMENT Bilateral   . ORIF WRIST FRACTURE Left 09/09/2016   Procedure: OPEN REDUCTION INTERNAL FIXATION (ORIF) LEFT WRIST FRACTURE, LEFT CARPAL TUNNEL RELEASE;  Surgeon: Dominica Severin, MD;  Location: [facility] ORS;  Service: Orthopedics;  Laterality: Left;  . TONSILLECTOMY       reports that she has been smoking cigarettes.  She has a 20.00 pack-year smoking history. she has never used smokeless tobacco. She reports that she drinks about 12.6 oz of alcohol per week. She reports that she does not use drugs.  No Known Allergies  Family History  Problem Relation Age of Onset  . Arrhythmia Mother   . Heart disease Father   . Hypertension Father     Prior to Admission medications   Medication Sig Start Date End Date Taking? Authorizing Provider  albuterol (PROVENTIL HFA;VENTOLIN HFA) 108 (90 Base) MCG/ACT inhaler Inhale 2 puffs into the lungs every 6 (six) hours as needed for wheezing or shortness of breath.    [provider]  busPIRone (BUSPAR) 15 MG tablet TAKE 1 TABLET(15 MG) BY MOUTH TWICE DAILY Patient taking differently: Take 15 mg by mouth two times a day 04/19/16   [name], MD  FLUoxetine (PROZAC) 20 MG capsule Take 20 mg by mouth daily.  03/30/16   [provider]  Fluticasone-Salmeterol (ADVAIR) 100-50 MCG/DOSE AEPB Inhale 1-2 puffs 2 (two) times daily into the lungs.  11/05/14   [provider]  furosemide (LASIX) 40 MG tablet Take 1 tablet (40 mg total) by mouth daily. 06/17/16   Midge Minium, MD  gabapentin (NEURONTIN) 400 MG capsule Take 1 capsule (400 mg total) 3 (three) times daily by mouth. 12/26/16   Charm Rings, NP  Melatonin 3 MG TABS Take 1 tablet (3 mg total) by mouth at bedtime as needed (sleep). 10/24/15   Garth Bigness, MD  modafinil (PROVIGIL) 200 MG tablet Take 200 mg by mouth daily. 08/04/16    [provider]  ondansetron (ZOFRAN) 4 MG tablet Take 1 tablet (4 mg total) every 8 (eight) hours as needed by mouth for nausea or vomiting. 12/26/16   Charm Rings, NP  oxyCODONE (OXY IR/ROXICODONE) 5 MG immediate release tablet Take one tablet prior to dressing changes three times per week for pain. Patient not taking: Reported on 01/17/2017 01/11/17   Standley Brooking, MD  pantoprazole (PROTONIX) 40 MG tablet Take 1 tablet (40 mg total) by mouth daily. 12/30/15   Haney, Arlyss Repress A, MD  potassium chloride 20 MEQ/15ML (10%) SOLN Take 30 mLs (40 mEq total) by mouth 2 (two) times daily. 01/10/17   Marinda Elk, MD  rifaximin (XIFAXAN) 550 MG TABS tablet Take 1 tablet (550 mg total) by mouth 2 (two) times daily. Patient not taking: Reported on 01/17/2017 06/05/15   Catarina Hartshorn, MD  risperiDONE (RISPERDAL) 0.5 MG tablet Take 1 tablet (0.5 mg total) by mouth 3 (three) times daily as needed (agitation). Patient taking differently: Take 0.5 mg by mouth 2 (two) times daily.  02/06/16   Renne Musca, MD  rizatriptan (MAXALT) 10 MG tablet Take 1 tablet (10 mg total) by mouth as needed for migraine. May repeat in 2 hours if needed 12/06/16   Porfirio Oar, PA-C  spironolactone (ALDACTONE) 100 MG tablet Take 1 tablet (100 mg total) by mouth daily. 11/23/16   Derwood Kaplan, MD  topiramate (TOPAMAX) 100 MG tablet Take 1 tablet (100 mg total) by mouth daily. 01/07/16   [name], MD    Physical Exam: Vitals:   03/07/17 1900 03/07/17 1930 03/07/17 1945 03/07/17 2015  BP:   (!) 120/56 122/62  Pulse:  (!) 121 85 81  Resp:  (!) 21 (!) 28 19  Temp:      TempSrc:      SpO2:  92% 91% 99%  Weight: 80.3 kg (177 lb 0.5 oz)     Height: 5\' 2"  (1.575 m)         Constitutional: NAD, calm, comfortable slightly sedated Vitals:   03/07/17 1900 03/07/17 1930 03/07/17 1945 03/07/17 2015  BP:   (!) 120/56 122/62  Pulse:  (!) 121 85 81  Resp:  (!) 21 (!) 28 19  Temp:        TempSrc:      SpO2:  92% 91% 99%  Weight: 80.3 kg (177 lb 0.5 oz)     Height: 5\' 2"  (1.575 m)      Eyes: PERRL, lids and conjunctivae normal ENMT: Mucous membranes are moist. Posterior pharynx clear of any exudate or lesions.Normal dentition.  Neck: normal, supple, no masses, no thyromegaly Respiratory:  diminished to auscultation bilaterally, no wheezing, no crackles. Normal respiratory effort. No accessory muscle use.  Cardiovascular: Regular rate and rhythm, no murmurs / rubs / gallops. No extremity edema. 2+ pedal pulses. No  carotid bruits.  Abdomen: no tenderness, no masses palpated. No hepatosplenomegaly. Bowel sounds positive.  Musculoskeletal: no clubbing / cyanosis. No joint deformity upper and lower extremities. Good ROM, no contractures. Normal muscle tone.  Skin: no rashes, lesions, ulcers. No induration Neurologic: CN 2-12 grossly intact. Sensation intact, DTR normal. Strength 5/5 in all 4.  Psychiatric: Normal judgment and insight. Alert and oriented x 3. Normal mood.    Labs on Admission: I have personally reviewed following labs and imaging studies  CBC: Recent Labs  Lab 03/07/17 1333  WBC 5.0  NEUTROABS 3.3  HGB 10.5*  HCT 34.3*  MCV 88.6  PLT 167   Basic Metabolic Panel: Recent Labs  Lab 03/07/17 1333  NA 138  K 3.4*  CL 97*  CO2 27  GLUCOSE 89  BUN <5*  CREATININE 0.37*  CALCIUM 7.6*   GFR: Estimated Creatinine Clearance: 76.2 mL/min (A) (by C-G formula based on SCr of 0.37 mg/dL (L)). Liver Function Tests: Recent Labs  Lab 03/07/17 1333  AST 51*  ALT 21  ALKPHOS 157*  BILITOT 0.7  PROT 6.2*  ALBUMIN 3.2*   Recent Labs  Lab 03/07/17 1840  LIPASE 29   Recent Labs  Lab 03/07/17 1333  AMMONIA 44*   Coagulation Profile: Recent Labs  Lab 03/07/17 1333  INR 1.18   Cardiac Enzymes: No results for input(s): CKTOTAL, CKMB, CKMBINDEX, TROPONINI in the last 168 hours. BNP (last 3 results) No results for input(s): PROBNP in the last  8760 hours. HbA1C: No results for input(s): HGBA1C in the last 72 hours. CBG: No results for input(s): GLUCAP in the last 168 hours. Lipid Profile: No results for input(s): CHOL, HDL, LDLCALC, TRIG, CHOLHDL, LDLDIRECT in the last 72 hours. Thyroid Function Tests: No results for input(s): TSH, T4TOTAL, FREET4, T3FREE, THYROIDAB in the last 72 hours. Anemia Panel: No results for input(s): VITAMINB12, FOLATE, FERRITIN, TIBC, IRON, RETICCTPCT in the last 72 hours. Urine analysis:    Component Value Date/Time   COLORURINE STRAW (A) 12/29/2016 2130   APPEARANCEUR CLEAR 12/29/2016 2130   LABSPEC 1.005 12/29/2016 2130   PHURINE 7.0 12/29/2016 2130   GLUCOSEU NEGATIVE 12/29/2016 2130   HGBUR NEGATIVE 12/29/2016 2130   BILIRUBINUR NEGATIVE 12/29/2016 2130   BILIRUBINUR moderate (A) 06/19/2015 1906   KETONESUR NEGATIVE 12/29/2016 2130   PROTEINUR NEGATIVE 12/29/2016 2130   UROBILINOGEN >=8.0 06/19/2015 1906   UROBILINOGEN 0.2 09/14/2011 2141   NITRITE NEGATIVE 12/29/2016 2130   LEUKOCYTESUR NEGATIVE 12/29/2016 2130   Sepsis Labs: !!!!!!!!!!!!!!!!!!!!!!!!!!!!!!!!!!!!!!!!!!!! @LABRCNTIP (procalcitonin:4,lacticidven:4) )No results found for this or any previous visit (from the past 240 hour(s)).   Radiological Exams on Admission: Ct Head Wo Contrast  Result Date: 03/07/2017 CLINICAL DATA:  Expressive aphasia. Altered mental status. History of multiple sclerosis. EXAM: CT HEAD WITHOUT CONTRAST TECHNIQUE: Contiguous axial images were obtained from the base of the skull through the vertex without intravenous contrast. COMPARISON:  12/26/2015 head CT and 08/02/2015 MRI FINDINGS: Brain: There is no evidence of acute infarct, intracranial hemorrhage, mass, midline shift, or extra-axial fluid collection. Generalized cerebral atrophy is unchanged and mildly advanced for age. Patchy to confluent bilateral cerebral white matter hypodensities are similar to the prior CT and moderate for age. Vascular:  Calcified atherosclerosis at the skull base. No hyperdense vessel. Skull: No fracture or focal osseous lesion. Sinuses/Orbits: Minimal scattered paranasal sinus mucosal thickening. Underdeveloped mastoid air cells bilaterally. Unremarkable orbits. Other: None. IMPRESSION: 1. No evidence of acute intracranial abnormality. 2. Similar appearance of chronic white matter disease and cerebral  atrophy in this patient with a history of multiple sclerosis. Electronically Signed   By: Sebastian Ache M.D.   On: 03/07/2017 14:39   Dg Chest Port 1 View  Result Date: 03/07/2017 CLINICAL DATA:  Shortness of breath, current smoker. EXAM: PORTABLE CHEST 1 VIEW COMPARISON:  CT scan of the chest of January 18, 2017 and chest x-ray of January 02, 2017. FINDINGS: The lungs are adequately inflated. There are coarse lung markings at the left base. The heart is enlarged. The pulmonary vascularity is not clearly engorged. There are bilateral breast implants present. IMPRESSION: COPD-smoking related changes. Coarse linear densities at the left lung base compatible with subsegmental atelectasis or early pneumonia. Followup PA and lateral chest X-ray is recommended in 3-4 weeks following trial of antibiotic therapy to ensure resolution and exclude underlying malignancy. Electronically Signed   By: David  [place] M.D.   On: 03/07/2017 14:01    Old chart reviewed including many recent phone calls to her various outpatient physicians wanting refills on her medications without any follow-up appointments as an outpatient  Case discussed with EDP  Assessment/Plan 57 year old female with COPD exacerbation and hypoxia Principal Problem:   Acute respiratory failure (HCC)-unclear if this is chronic or not.  Treat COPD.  Treat with steroids.  Treat with antibiotics.  Monitor oxygen sats.  Chest x-ray with atelectasis and no clear infiltrate.  Active Problems:   Noncompliance with therapeutic plan-noted   Stimulant abuse (HCC)-we will  hold off on any restarting her stimulants and recommend continue outpatient follow-up for these medications   Alcoholic cirrhosis of liver with ascites (HCC)-currently compensated   Bipolar I disorder (HCC)-noted   Alcohol abuse with alcohol-induced mood disorder (HCC)-patient's last drink was last night placed on Siva protocol   Non-intractable vomiting with nausea-PRN Zofran ordered   Hypoxia  with COPD exacerbation-placed on oral prednisone.  In the setting of no fever and normal white count we will not treat with Vanco and Zosyn and cancel these in the emergency department.  Will treat with IV Rocephin and azithromycin.  Obtain blood cultures and sputum cultures.     DVT prophylaxis: SCDs Code Status: Full Family Communication: None Disposition Plan: Per day team Consults called: None Admission status: Observation   DAVID,RACHAL A MD [facility]  If 7PM-7AM, please contact night-coverage [email or web address] Password [facility]  03/07/2017, 8:33 PM

## 2017-03-07 NOTE — ED Notes (Addendum)
Pt state she need to be admitted. C/o nause. Takinmg po fluids well.Pt states she has abdominal pain and shortness of breath. States she needs to be admitted because she has fever. Temp check at 98.4 oral. Pt states she feels like she has hot flash and is in withdrawal. Request iv med for same and librium. MD ware.

## 2017-03-07 NOTE — Discharge Instructions (Signed)
Please return for any problem.  Please obtain further treatment as an outpatient as instructed for your possible problems with excessive alcohol consumption.You are medically clear at this time for outpatient treatment of possible alcohol addiction problems.

## 2017-03-07 NOTE — ED Triage Notes (Addendum)
Pt arrives EMS from home with c/o expressive aphasia since at least 2 o'clock yesterday  when she called EMS for expressive aphasia but chose not to seek treatment. Pt stutters but then speaks clearly.

## 2017-03-07 NOTE — ED Provider Notes (Signed)
I was called to the bedside on patient discharge due to hypoxia and vomiting.  On my assessment, the patient is speaking in full sentences but she does appear moderately hypoxic.  She has diffuse wheezing, worse in the left lower lung base.  Review of chest x-ray shows atelectasis versus possible early infiltrate.  She is a very difficult historian and I do feel there is some secondary gain to her being here.  Her primary complaint is nausea and vomiting.  I suspect that she likely has an alcoholic gastritis versus pancreatitis with subsequent vomiting.  She very well may have aspirated. Her white count is normal.  I am going to trial a DuoNeb, give symptomatic control, and reassess.  I will also add on a lipase.  Denies any SI, HI, or auditory visual hallucinations.  Patient blood gas shows moderate hypoxia.  I reviewed her previous admissions and records. From what it seems, she's been borderline hypoxic previously but per ED notes on last eval, was satting well on RA at discharge.  She has required duo nebs and breathing treatments during previous admissions and has been off and on hypoxic.  She is a chronic smoker. I have given her a DuoNeb here with moderate improvement.  Given that she does have possible atelectasis versus pneumonia on x-ray, do think it is reasonable to treat her given her comorbidities. Will monitor, re-assess.  Following breathing treatment, the patient had initial improvement in sats but now has had multiple recorded O2 saturations of 85 with good pulse ox waveform.  Given her persistent cough, sputum production, and nausea with vomiting and absence of diarrhea, clinically I would lean towards treating the patient as a pneumonia.  Given her significant hypoxia with absence of oxygen at home and recent hospitalization, will admit.   Shaune Pollack, MD 03/07/17 1945

## 2017-03-07 NOTE — ED Notes (Signed)
Pt request protonix, zofran iv. MD aware.

## 2017-03-07 NOTE — ED Notes (Signed)
ED Provider at bedside. 

## 2017-03-07 NOTE — ED Triage Notes (Signed)
Pt clearly request [place] sandwhich then has stutterin episode.

## 2017-03-07 NOTE — ED Notes (Signed)
Pt walks to bathroom with steady gait. C/o dizziness at bathroom and wc back to room/. Pt requesting detox for alcoholism. Dr. Rodena Medin aware.

## 2017-03-07 NOTE — ED Notes (Addendum)
Pt speaks clearly with no stuttering.Ask repeatedly and is  Very insistent that she have food. Same reported to MD.

## 2017-03-07 NOTE — ED Notes (Addendum)
Bug noted by MD at waistband . No other bugs noted.Pt has healing abdominal wound with dry dressing in place.

## 2017-03-07 NOTE — ED Provider Notes (Signed)
[facility] EMERGENCY DEPARTMENT Provider Note   CSN: [id number] Arrival date & time: 03/07/17  1254     History   Chief Complaint Chief Complaint  Patient presents with  . Altered Mental Status    HPI Anne Black is a 57 y.o. female.  57 year old female with prior reported history of bipolar disease, anxiety, and MS who presents for evaluation of stuttering.  EMS reports the patient began to "stutter today" and then called the ambulance for help.  Patient intermittently will stutter during the evaluation.  She is distractible - when distracted she fails to stutter.  The patient denies any other significant acute complaint.  Patient does smell of alcohol and appears to be clinically intoxicated.  Patient denies chest pain, shortness of breath, abdominal pain, nausea, vomiting, or focal weakness.   Patient specifically denies suicidality or feeling unsafe at home.  She denies thoughts of hurting others.  Patient reports that she does not routinely have a stutter -  as she is having today.   The history is provided by the patient and the EMS personnel.  Illness  This is a new problem. The current episode started 12 to 24 hours ago. The problem occurs constantly. The problem has not changed since onset.Pertinent negatives include no chest pain and no abdominal pain. Nothing aggravates the symptoms. Nothing relieves the symptoms. She has tried nothing for the symptoms.    Past Medical History:  Diagnosis Date  . ADHD (attention deficit hyperactivity disorder)   . Anxiety   . Arthritis   . Ascites   . Bipolar disorder (HCC)   . Cirrhosis (HCC)   . Hypokalemia   . Migraine   . Multiple sclerosis (HCC)   . Osteoporosis     Patient Active Problem List   Diagnosis Date Noted  . Abdominal wall cellulitis 12/30/2016  . S/P exploratory laparotomy 12/30/2016  . Distal radius fracture, left 09/09/2016  . Thrombocytopenia (HCC) 09/09/2016  . Chest x-ray  abnormality   . Umbilical hernia without obstruction and without gangrene 05/12/2016  . Itching 02/24/2016  . Ventral hernia without obstruction or gangrene 02/24/2016  . Palliative care encounter   . Ascites   . Tachypnea   . Typical atrial flutter (HCC)   . Hypomagnesemia   . Hypoxia   . Dehydration 01/22/2016  . Low back ache 12/30/2015  . Non-intractable vomiting with nausea   . Alcohol abuse with alcohol-induced mood disorder (HCC) 12/15/2015  . Acute alcoholism (HCC)   . Abdominal distension 11/04/2015  . Alcohol abuse   . Dyspnea   . Acute respiratory failure (HCC) 10/15/2015  . Ascites due to alcoholic cirrhosis (HCC) 10/15/2015  . Osteoporosis   . Bipolar I disorder (HCC)   . Multiple sclerosis (HCC)   . Alcoholic cirrhosis of liver with ascites (HCC) 08/22/2015  . Bipolar disorder, current episode mixed, moderate (HCC) 08/01/2015  . Alcohol use disorder, severe, dependence (HCC) 07/31/2015  . Macrocytic anemia- due to alcohol abuse with normal B12 & folate levels 05/31/2015  . Severe protein-calorie malnutrition (HCC) 05/31/2015  . Hypokalemia 05/26/2015  . Encephalopathy, hepatic (HCC) 05/26/2015  . Alcohol withdrawal (HCC) 05/18/2015  . Abdominal pain 05/18/2015  . Anxiety 05/18/2015  . UTI (lower urinary tract infection) 05/18/2015  . Stimulant abuse (HCC) 12/27/2014  . Nicotine abuse 12/27/2014  . Hyperprolactinemia (HCC) 11/15/2014  . Dyslipidemia   . Tobacco abuse 01/23/2014  . Noncompliance with therapeutic plan 04/04/2013  . Unspecified hereditary and idiopathic peripheral neuropathy 05/01/2012  .  GERD (gastroesophageal reflux disease) 05/01/2012  . Osteoarthrosis, unspecified whether generalized or localized, involving lower leg 05/01/2012  . Hyponatremia 07/01/2011    Past Surgical History:  Procedure Laterality Date  . ABDOMINAL WALL DEFECT REPAIR N/A 12/30/2016   Procedure: EXPLORATORY LAPAROTOMY WITH REPAIR ABDOMINAL WALL VENTRAL HERNIA;   Surgeon: [name], MD;  Location: [facility] OR;  Service: General;  Laterality: N/A;  . APPLICATION OF WOUND VAC N/A 12/30/2016   Procedure: APPLICATION OF WOUND VAC;  Surgeon: [name], MD;  Location[facility] OR;  Service: General;  Laterality: N/A;  . CESAREAN SECTION  [phone]  . FRACTURE SURGERY    . HERNIA REPAIR    . MYRINGOTOMY WITH TUBE PLACEMENT Bilateral   . ORIF WRIST FRACTURE Left 09/09/2016   Procedure: OPEN REDUCTION INTERNAL FIXATION (ORIF) LEFT WRIST FRACTURE, LEFT CARPAL TUNNEL RELEASE;  Surgeon: Dominica Severin, MD;  Location: [facility] ORS;  Service: Orthopedics;  Laterality: Left;  . TONSILLECTOMY      OB History    No data available       Home Medications    Prior to Admission medications   Medication Sig Start Date End Date Taking? Authorizing Provider  albuterol (PROVENTIL HFA;VENTOLIN HFA) 108 (90 Base) MCG/ACT inhaler Inhale 2 puffs into the lungs every 6 (six) hours as needed for wheezing or shortness of breath.    [provider]  busPIRone (BUSPAR) 15 MG tablet TAKE 1 TABLET(15 MG) BY MOUTH TWICE DAILY Patient taking differently: Take 15 mg by mouth two times a day 04/19/16   [name], MD  FLUoxetine (PROZAC) 20 MG capsule Take 20 mg by mouth daily.  03/30/16   [provider]  Fluticasone-Salmeterol (ADVAIR) 100-50 MCG/DOSE AEPB Inhale 1-2 puffs 2 (two) times daily into the lungs.  11/05/14   [provider]  furosemide (LASIX) 40 MG tablet Take 1 tablet (40 mg total) by mouth daily. 06/17/16   Midge Minium, MD  gabapentin (NEURONTIN) 400 MG capsule Take 1 capsule (400 mg total) 3 (three) times daily by mouth. 12/26/16   Charm Rings, NP  Melatonin 3 MG TABS Take 1 tablet (3 mg total) by mouth at bedtime as needed (sleep). 10/24/15   Garth Bigness, MD  modafinil (PROVIGIL) 200 MG tablet Take 200 mg by mouth daily. 08/04/16   [provider]  ondansetron (ZOFRAN) 4 MG tablet Take 1 tablet (4 mg total) every  8 (eight) hours as needed by mouth for nausea or vomiting. 12/26/16   Charm Rings, NP  oxyCODONE (OXY IR/ROXICODONE) 5 MG immediate release tablet Take one tablet prior to dressing changes three times per week for pain. Patient not taking: Reported on 01/17/2017 01/11/17   Standley Brooking, MD  pantoprazole (PROTONIX) 40 MG tablet Take 1 tablet (40 mg total) by mouth daily. 12/30/15   Haney, Arlyss Repress A, MD  potassium chloride 20 MEQ/15ML (10%) SOLN Take 30 mLs (40 mEq total) by mouth 2 (two) times daily. 01/10/17   Marinda Elk, MD  rifaximin (XIFAXAN) 550 MG TABS tablet Take 1 tablet (550 mg total) by mouth 2 (two) times daily. Patient not taking: Reported on 01/17/2017 06/05/15   Catarina Hartshorn, MD  risperiDONE (RISPERDAL) 0.5 MG tablet Take 1 tablet (0.5 mg total) by mouth 3 (three) times daily as needed (agitation). Patient taking differently: Take 0.5 mg by mouth 2 (two) times daily.  02/06/16   Renne Musca, MD  rizatriptan (MAXALT) 10 MG tablet Take 1 tablet (10 mg total) by mouth as  needed for migraine. May repeat in 2 hours if needed 12/06/16   Porfirio Oar, PA-C  spironolactone (ALDACTONE) 100 MG tablet Take 1 tablet (100 mg total) by mouth daily. 11/23/16   Derwood Kaplan, MD  topiramate (TOPAMAX) 100 MG tablet Take 1 tablet (100 mg total) by mouth daily. 01/07/16   [name], MD    Family History Family History  Problem Relation Age of Onset  . Arrhythmia Mother   . Heart disease Father   . Hypertension Father     Social History Social History   Tobacco Use  . Smoking status: Current Every Day Smoker    Packs/day: 1.00    Years: 20.00    Pack years: 20.00    Types: Cigarettes  . Smokeless tobacco: Never Used  . Tobacco comment: Currently Vaping Daily  Substance Use Topics  . Alcohol use: Yes    Alcohol/week: 12.6 oz    Types: 21 Glasses of wine per week  . Drug use: No     Allergies   Patient has no known allergies.   Review of  Systems Review of Systems  Cardiovascular: Negative for chest pain.  Gastrointestinal: Negative for abdominal pain.  All other systems reviewed and are negative.    Physical Exam Updated Vital Signs BP (!) 115/59   Pulse 79   Temp 97.8 F (36.6 C) (Oral)   Resp 18   SpO2 (!) 80%   Physical Exam  Constitutional: She is oriented to person, place, and time. She appears well-developed and well-nourished. No distress.  HENT:  Head: Normocephalic and atraumatic.  Mouth/Throat: Oropharynx is clear and moist.  Eyes: Conjunctivae and EOM are normal. Pupils are equal, round, and reactive to light.  Neck: Normal range of motion. Neck supple.  Cardiovascular: Normal rate, regular rhythm and normal heart sounds.  Pulmonary/Chest: Effort normal and breath sounds normal. No respiratory distress.  Abdominal: Soft. She exhibits no distension. There is no tenderness.  Musculoskeletal: Normal range of motion. She exhibits no edema or deformity.  Neurological: She is alert and oriented to person, place, and time. She displays normal reflexes. No cranial nerve deficit or sensory deficit. She exhibits normal muscle tone. Coordination normal.  Skin: Skin is warm and dry.  Psychiatric: She has a normal mood and affect.  Nursing note and vitals reviewed.    ED Treatments / Results  Labs (all labs ordered are listed, but only abnormal results are displayed) Labs Reviewed  ETHANOL - Abnormal; Notable for the following components:      Result Value   Alcohol, Ethyl (B) 181 (*)    All other components within normal limits  COMPREHENSIVE METABOLIC PANEL - Abnormal; Notable for the following components:   Potassium 3.4 (*)    Chloride 97 (*)    BUN <5 (*)    Creatinine, Ser 0.37 (*)    Calcium 7.6 (*)    Total Protein 6.2 (*)    Albumin 3.2 (*)    AST 51 (*)    Alkaline Phosphatase 157 (*)    All other components within normal limits  CBC WITH DIFFERENTIAL/PLATELET - Abnormal; Notable for the  following components:   Hemoglobin 10.5 (*)    HCT 34.3 (*)    RDW 16.9 (*)    All other components within normal limits  AMMONIA - Abnormal; Notable for the following components:   Ammonia 44 (*)    All other components within normal limits  ACETAMINOPHEN LEVEL - Abnormal; Notable for the following components:  Acetaminophen (Tylenol), Serum <10 (*)    All other components within normal limits  RAPID URINE DRUG SCREEN, HOSP PERFORMED  PROTIME-INR  SALICYLATE LEVEL    EKG  EKG Interpretation  Date/Time:  Monday March 07 2017 13:45:07 EST Ventricular Rate:  77 PR Interval:    QRS Duration: 95 QT Interval:  407 QTC Calculation: 461 R Axis:   79 Text Interpretation:  Sinus rhythm Confirmed by Kristine Royal [phone]) on 03/07/2017 4:13:37 PM       Radiology Ct Head Wo Contrast  Result Date: 03/07/2017 CLINICAL DATA:  Expressive aphasia. Altered mental status. History of multiple sclerosis. EXAM: CT HEAD WITHOUT CONTRAST TECHNIQUE: Contiguous axial images were obtained from the base of the skull through the vertex without intravenous contrast. COMPARISON:  12/26/2015 head CT and 08/02/2015 MRI FINDINGS: Brain: There is no evidence of acute infarct, intracranial hemorrhage, mass, midline shift, or extra-axial fluid collection. Generalized cerebral atrophy is unchanged and mildly advanced for age. Patchy to confluent bilateral cerebral white matter hypodensities are similar to the prior CT and moderate for age. Vascular: Calcified atherosclerosis at the skull base. No hyperdense vessel. Skull: No fracture or focal osseous lesion. Sinuses/Orbits: Minimal scattered paranasal sinus mucosal thickening. Underdeveloped mastoid air cells bilaterally. Unremarkable orbits. Other: None. IMPRESSION: 1. No evidence of acute intracranial abnormality. 2. Similar appearance of chronic white matter disease and cerebral atrophy in this patient with a history of multiple sclerosis. Electronically Signed    By: Sebastian Ache M.D.   On: 03/07/2017 14:39   Dg Chest Port 1 View  Result Date: 03/07/2017 CLINICAL DATA:  Shortness of breath, current smoker. EXAM: PORTABLE CHEST 1 VIEW COMPARISON:  CT scan of the chest of January 18, 2017 and chest x-ray of January 02, 2017. FINDINGS: The lungs are adequately inflated. There are coarse lung markings at the left base. The heart is enlarged. The pulmonary vascularity is not clearly engorged. There are bilateral breast implants present. IMPRESSION: COPD-smoking related changes. Coarse linear densities at the left lung base compatible with subsegmental atelectasis or early pneumonia. Followup PA and lateral chest X-ray is recommended in 3-4 weeks following trial of antibiotic therapy to ensure resolution and exclude underlying malignancy. Electronically Signed   By: David  [place] M.D.   On: 03/07/2017 14:01    Procedures Procedures (including critical care time)  Medications Ordered in ED Medications - No data to display   Initial Impression / Assessment and Plan / ED Course  I have reviewed the triage vital signs and the nursing notes.  Pertinent labs & imaging results that were available during my care of the patient were reviewed by me and considered in my medical decision making (see chart for details).  Clinical Course as of Mar 08 1607  Mon Mar 07, 2017  1506 Eosinophil: 0 [PM]    Clinical Course User Index [PM] Wynetta Fines, MD    MDM screen complete  Patient is presented for evaluation of "stutter."  Patient appears to be upon initial evaluation to be clinically intoxicated.  Alcohol level is elevated.  No other significant findings found during workup in the ED.  Patient appears to be improved following a period of observation as she has become more sober.  Patient does not represent a threat to herself or to others.  Patient understands the need to follow-up for further treatment of a possible alcohol addiction.  Strict return  precautions given and understood.    Final Clinical Impressions(s) / ED Diagnoses   Final diagnoses:  Alcoholic intoxication without complication [facility])    ED Discharge Orders    None       Wynetta Fines, MD 03/07/17 1615

## 2017-03-07 NOTE — ED Notes (Signed)
Pt questioned about home ienvironment. Pt has clear speech when she ask if I am serious about removing  Bug from waistband of clothing. Pt states she has no knowledge of bugs in her home.

## 2017-03-07 NOTE — Telephone Encounter (Signed)
Attempted to contact pt.  Busy signal, no answer.

## 2017-03-07 NOTE — ED Notes (Signed)
Pt request protonix, zofran iv and "something for detox iv". Will inform MD.

## 2017-03-08 ENCOUNTER — Other Ambulatory Visit: Payer: Self-pay

## 2017-03-08 DIAGNOSIS — J9811 Atelectasis: Secondary | ICD-10-CM | POA: Diagnosis present

## 2017-03-08 DIAGNOSIS — K219 Gastro-esophageal reflux disease without esophagitis: Secondary | ICD-10-CM | POA: Diagnosis present

## 2017-03-08 DIAGNOSIS — Z9111 Patient's noncompliance with dietary regimen: Secondary | ICD-10-CM | POA: Diagnosis not present

## 2017-03-08 DIAGNOSIS — F1510 Other stimulant abuse, uncomplicated: Secondary | ICD-10-CM | POA: Diagnosis not present

## 2017-03-08 DIAGNOSIS — F1014 Alcohol abuse with alcohol-induced mood disorder: Secondary | ICD-10-CM | POA: Diagnosis not present

## 2017-03-08 DIAGNOSIS — F10230 Alcohol dependence with withdrawal, uncomplicated: Secondary | ICD-10-CM | POA: Diagnosis present

## 2017-03-08 DIAGNOSIS — G35 Multiple sclerosis: Secondary | ICD-10-CM | POA: Diagnosis present

## 2017-03-08 DIAGNOSIS — M810 Age-related osteoporosis without current pathological fracture: Secondary | ICD-10-CM | POA: Diagnosis present

## 2017-03-08 DIAGNOSIS — F1024 Alcohol dependence with alcohol-induced mood disorder: Secondary | ICD-10-CM | POA: Diagnosis present

## 2017-03-08 DIAGNOSIS — R0902 Hypoxemia: Secondary | ICD-10-CM | POA: Diagnosis not present

## 2017-03-08 DIAGNOSIS — Z23 Encounter for immunization: Secondary | ICD-10-CM | POA: Diagnosis not present

## 2017-03-08 DIAGNOSIS — F10220 Alcohol dependence with intoxication, uncomplicated: Secondary | ICD-10-CM | POA: Diagnosis present

## 2017-03-08 DIAGNOSIS — Y906 Blood alcohol level of 120-199 mg/100 ml: Secondary | ICD-10-CM | POA: Diagnosis present

## 2017-03-08 DIAGNOSIS — J441 Chronic obstructive pulmonary disease with (acute) exacerbation: Secondary | ICD-10-CM | POA: Diagnosis present

## 2017-03-08 DIAGNOSIS — Z7951 Long term (current) use of inhaled steroids: Secondary | ICD-10-CM | POA: Diagnosis not present

## 2017-03-08 DIAGNOSIS — Z9119 Patient's noncompliance with other medical treatment and regimen: Secondary | ICD-10-CM | POA: Diagnosis not present

## 2017-03-08 DIAGNOSIS — F10920 Alcohol use, unspecified with intoxication, uncomplicated: Secondary | ICD-10-CM | POA: Diagnosis not present

## 2017-03-08 DIAGNOSIS — F319 Bipolar disorder, unspecified: Secondary | ICD-10-CM | POA: Diagnosis not present

## 2017-03-08 DIAGNOSIS — Z79899 Other long term (current) drug therapy: Secondary | ICD-10-CM | POA: Diagnosis not present

## 2017-03-08 DIAGNOSIS — F909 Attention-deficit hyperactivity disorder, unspecified type: Secondary | ICD-10-CM | POA: Diagnosis present

## 2017-03-08 DIAGNOSIS — J9601 Acute respiratory failure with hypoxia: Secondary | ICD-10-CM | POA: Diagnosis not present

## 2017-03-08 DIAGNOSIS — F419 Anxiety disorder, unspecified: Secondary | ICD-10-CM | POA: Diagnosis present

## 2017-03-08 DIAGNOSIS — F17210 Nicotine dependence, cigarettes, uncomplicated: Secondary | ICD-10-CM | POA: Diagnosis present

## 2017-03-08 DIAGNOSIS — K7031 Alcoholic cirrhosis of liver with ascites: Secondary | ICD-10-CM | POA: Diagnosis not present

## 2017-03-08 DIAGNOSIS — R112 Nausea with vomiting, unspecified: Secondary | ICD-10-CM | POA: Diagnosis present

## 2017-03-08 LAB — BASIC METABOLIC PANEL
Anion gap: 11 (ref 5–15)
BUN: 5 mg/dL — ABNORMAL LOW (ref 6–20)
CHLORIDE: 98 mmol/L — AB (ref 101–111)
CO2: 27 mmol/L (ref 22–32)
Calcium: 8.2 mg/dL — ABNORMAL LOW (ref 8.9–10.3)
Creatinine, Ser: 0.51 mg/dL (ref 0.44–1.00)
GFR calc Af Amer: >60 mL/min (ref >60)
GFR calc non Af Amer: >60 mL/min (ref >60)
GLUCOSE: 175 mg/dL — AB (ref 65–99)
POTASSIUM: 3.7 mmol/L (ref 3.5–5.1)
Sodium: 136 mmol/L (ref 135–145)

## 2017-03-08 LAB — CBC WITH DIFFERENTIAL/PLATELET
Basophils Absolute: 0 10*3/uL (ref 0.0–0.1)
Basophils Relative: 0 %
Eosinophils Absolute: 0 10*3/uL (ref 0.0–0.7)
Eosinophils Relative: 0 %
HCT: 36 % (ref 36.0–46.0)
Hemoglobin: 10.8 g/dL — ABNORMAL LOW (ref 12.0–15.0)
LYMPHS ABS: 0.3 10*3/uL — AB (ref 0.7–4.0)
LYMPHS PCT: 7 %
MCH: 26.8 pg (ref 26.0–34.0)
MCHC: 30 g/dL (ref 30.0–36.0)
MCV: 89.3 fL (ref 78.0–100.0)
Monocytes Absolute: 0.1 10*3/uL (ref 0.1–1.0)
Monocytes Relative: 2 %
Neutro Abs: 4.2 10*3/uL (ref 1.7–7.7)
Neutrophils Relative %: 91 %
PLATELETS: 162 10*3/uL (ref 150–400)
RBC: 4.03 MIL/uL (ref 3.87–5.11)
RDW: 17 % — ABNORMAL HIGH (ref 11.5–15.5)
WBC: 4.6 10*3/uL (ref 4.0–10.5)

## 2017-03-08 LAB — AMMONIA: Ammonia: 52 umol/L — ABNORMAL HIGH (ref 9–35)

## 2017-03-08 MED ORDER — RISPERIDONE 0.5 MG PO TABS
0.5000 mg | ORAL_TABLET | Freq: Two times a day (BID) | ORAL | Status: DC
  Administered 2017-03-08 – 2017-03-11 (×7): 0.5 mg via ORAL
  Filled 2017-03-08 (×8): qty 1

## 2017-03-08 MED ORDER — PNEUMOCOCCAL VAC POLYVALENT 25 MCG/0.5ML IJ INJ
0.5000 mL | INJECTION | INTRAMUSCULAR | Status: DC
  Filled 2017-03-08: qty 0.5

## 2017-03-08 MED ORDER — DIPHENHYDRAMINE HCL 25 MG PO CAPS
50.0000 mg | ORAL_CAPSULE | Freq: Once | ORAL | Status: AC
  Administered 2017-03-09: 50 mg via ORAL
  Filled 2017-03-08: qty 2

## 2017-03-08 MED ORDER — TRAMADOL HCL 50 MG PO TABS
50.0000 mg | ORAL_TABLET | Freq: Four times a day (QID) | ORAL | Status: DC | PRN
  Administered 2017-03-09 – 2017-03-10 (×3): 50 mg via ORAL
  Filled 2017-03-08 (×4): qty 1

## 2017-03-08 MED ORDER — LORAZEPAM 1 MG PO TABS
1.0000 mg | ORAL_TABLET | Freq: Once | ORAL | Status: AC
  Administered 2017-03-08: 1 mg via ORAL
  Filled 2017-03-08: qty 1

## 2017-03-08 MED ORDER — NICOTINE 14 MG/24HR TD PT24
14.0000 mg | MEDICATED_PATCH | Freq: Every day | TRANSDERMAL | Status: DC
  Administered 2017-03-08 – 2017-03-11 (×4): 14 mg via TRANSDERMAL
  Filled 2017-03-08 (×4): qty 1

## 2017-03-08 MED ORDER — LACTULOSE 10 GM/15ML PO SOLN: 10.0000 g | Freq: Once | ORAL | Status: DC

## 2017-03-08 MED ORDER — INFLUENZA VAC SPLIT QUAD 0.5 ML IM SUSY
0.5000 mL | PREFILLED_SYRINGE | INTRAMUSCULAR | Status: AC
  Administered 2017-03-09: 0.5 mL via INTRAMUSCULAR
  Filled 2017-03-08: qty 0.5

## 2017-03-08 MED ORDER — AZITHROMYCIN 500 MG PO TABS
500.0000 mg | ORAL_TABLET | Freq: Every day | ORAL | Status: DC
  Administered 2017-03-08 – 2017-03-10 (×3): 500 mg via ORAL
  Filled 2017-03-08 (×3): qty 1

## 2017-03-08 MED ORDER — DIPHENHYDRAMINE HCL 25 MG PO CAPS
25.0000 mg | ORAL_CAPSULE | ORAL | Status: DC | PRN
  Administered 2017-03-11: 25 mg via ORAL
  Filled 2017-03-08: qty 1

## 2017-03-08 MED ORDER — IPRATROPIUM-ALBUTEROL 0.5-2.5 (3) MG/3ML IN SOLN
3.0000 mL | Freq: Three times a day (TID) | RESPIRATORY_TRACT | Status: DC
  Administered 2017-03-09 – 2017-03-10 (×4): 3 mL via RESPIRATORY_TRACT
  Filled 2017-03-08 (×5): qty 3

## 2017-03-08 NOTE — Progress Notes (Signed)
ill Date ID Written Drug Qty Days Prescriber Rx # Pharmacy Refill Daily Dose * Pymt Type PMP  01/15/2017  1   01/11/2017  Oxycodone Hcl 5 MG Tablet  9 7 Da Goo  [id number] [facility] ([id number])  0 9.64 MME  Comm Ins  [address]  01/12/2017  1   11/10/2016  Modafinil 200 MG Tablet  30 30 Ch Eck  [id number] [facility] ([id number])  0  Comm Ins  [place]  12/14/2016  1   11/10/2016  Modafinil 200 MG Tablet  30 30 Ch Eck  [id number] [facility] (8151)  1  Comm Ins  [place]  12/09/2016  1   12/09/2016  Chlordiazepoxide 25 MG Capsule  10 3 Ke Cam  [id number] [facility] (8151)  0 3.33 LME  Comm Ins  [place]  11/25/2016  1   11/23/2016  Chlordiazepoxide 25 MG Capsule  10 3 An Nan  [id number] [facility] (8151)  0 3.33 LME  Comm Ins  [place]  11/10/2016  1   11/10/2016  Modafinil 200 MG Tablet  30 30 Ch Eck  [id number] [facility] (8151)  0  Comm Ins  [place]  10/21/2016  1   10/05/2016  Vyvanse 20 MG Chewable Tablet  65 30 Ca Gol  [id number] [facility] (1610)  0  Comm Ins  [place]  09/28/2016  1   09/28/2016  Vyvanse 70 MG Capsule  30 30 Ca Gol  [id number] [facility] (8151)  0  Comm Ins  [place]  09/17/2016  1   09/17/2016  Oxycodone Hcl 10 MG Tablet  28 7 Ri Ram  [id number] [facility] (8151)  0 60.00 MME  Comm Ins  [place]  09/12/2016  1   09/11/2016  Oxycodone Hcl 10 MG Tablet  12 2 Da Tat  [id number] [facility] (8151)  0 90.00 MME  Comm Ins  [place]  09/04/2016  1   06/09/2016  Modafinil 200 MG Tablet  30 30 Ch Eck  [id number] [facility] (1610)  0  Comm Ins  [place]  08/09/2016  1   08/05/2016  Vyvanse 70 MG Capsule  30 30 Ca Gol  [id number] [facility] (8151)  0  Comm Ins  [place]  08/04/2016  1   06/09/2016  Modafinil 200 MG Tablet  30 30 Ch Eck  [id number] [facility] (8151)  0  Comm Ins  [place]  07/11/2016  1   07/08/2016  Vyvanse 70 MG Capsule  30 30 Ca Gol  [id number] [facility] (8151)  0  Comm Ins  [place]  06/11/2016  1   06/11/2016  Vyvanse 70 MG Capsule  30 30 Ca Gol  [id number] [facility] (8151)  0  Comm Ins  [place]  05/12/2016  1   05/12/2016  Vyvanse 60 MG Capsule  30 30 Ca Gol  [id number] [facility] (8151)  0  Comm Ins  [place]  05/05/2016  1   05/05/2016  Modafinil 200 MG Tablet  30 30 Ch Eck  [id number] [facility] (8151)  0  Comm Ins  [place]  04/10/2016  1    04/09/2016  Vyvanse 50 MG Capsule  30 30 Ca Gol  [id number] [facility] (8151)  0  Comm Ins  [place]  12/26/2015  1   12/26/2015  Chlordiazepoxide 25 MG Capsule  10 3 Ro Loc  [id number] [facility] (8151)  0 3.33 LME  Comm Ins  [place]  09/15/2015  1   09/12/2015  Lorazepam 0.5 MG Tablet  20 6 An Cro  [id number] [facility] (8151)  0 1.67 LME  Comm Ins  [place]  04/15/2015  1   04/15/2015  Diazepam 5 MG Tablet  2 2 Ch Eck  [id number] [facility] (8151)  0 0.50 LME  Comm Ins  [place]  04/06/2015  1   04/06/2015  Clonazepam 1 MG Tablet  30 15 Ch Jef  [id number] [facility] (8151)  0 4.00 LME  Comm Ins  [place]  04/06/2015  1   04/06/2015  Hydrocodone-Chlorphen Er Susp  100 10 Ch Jef  [id number] [facility] (8151)  0 20.00 MME  [place] Ins  [place]  * Above is patient's board of phrmacy--- has not had vyvance in >6 months and had not filled provigil in > 1 month

## 2017-03-08 NOTE — Progress Notes (Signed)
PROGRESS NOTE    Anne Black  WUJ:[id number] DOB: 1960/02/23 DOA: 03/07/2017 PCP: Patient, No Pcp Per   Outpatient Specialists:     Brief Narrative:  Anne Black is a 57 y.o. female with medical history significant of bipolar disorder, chronic alcohol abuse, cirrhosis of the liver, multiple sclerosis, ADHD, polysubstance abuse, comes in with stuttering along with nausea and vomiting and cough and shortness of breath.  Patient was in the process of being discharged when she got into an altercation with staff.  She was then noted to be short of breath and wheezing and hypoxic with O2 sats in the upper 80s.  Patient reports she has been told in the past she needs home oxygen.  She does not have home oxygen this is never been arranged.  She reports that she has been coughing.  She is still drinking daily.  She denies any fevers.  She is still smoking.  Patient denies any diarrhea.  Patient referred for COPD exacerbation and possible pneumonia.  She has been given vancomycin and Zosyn.  Chest x-ray does not show clear infiltrate.       Assessment & Plan:   Principal Problem:   Acute respiratory failure (HCC) Active Problems:   Noncompliance with therapeutic plan   Stimulant abuse (HCC)   Alcoholic cirrhosis of liver with ascites (HCC)   Bipolar I disorder (HCC)   Alcohol abuse with alcohol-induced mood disorder (HCC)   Non-intractable vomiting with nausea   Hypoxia    Acute respiratory failure (HCC) due to CAP -unclear if this is chronic or not.   -treat PNA and then recheck, patient continues to smoke -IV abx -prednisone  Tobacco abuse -encourage cessation -patch ordered    Noncompliance with therapeutic plan -multiple providers/dismissals from practices    Stimulant abuse (HCC)- -outpatient follow-up for these medications- attempted to run board of pharmacy    Alcoholic cirrhosis of liver with ascites (HCC) -monitor -resume meds    Bipolar I disorder (HCC) -need  outpatient follow up    Alcohol abuse with alcohol-induced mood disorder (HCC) -CIWA -social work consult    Non-intractable vomiting with nausea -PRN Zofran        DVT prophylaxis:  SCD's  Code Status: Full Code   Family Communication:   Disposition Plan:     Consultants:    Subjective: Per nursing asking for vyvanse  Objective: Vitals:   03/08/17 0430 03/08/17 0530 03/08/17 0633 03/08/17 1215  BP: (!) 92/51 (!) 101/57 (!) 142/55 132/68  Pulse: 75  82 87  Resp:   (!) 26 20  Temp:   99.2 F (37.3 C) 98.6 F (37 C)  TempSrc:   Oral Oral  SpO2: 96%  90% 95%  Weight:   76.5 kg (168 lb 10.4 oz)   Height:   5\' 2"  (1.575 m)     Intake/Output Summary (Last 24 hours) at 03/08/2017 1333 Last data filed at 03/08/2017 1010 Gross per 24 hour  Intake 1670 ml  Output -  Net 1670 ml   Filed Weights   03/07/17 1900 03/08/17 9562  Weight: 80.3 kg (177 lb 0.5 oz) 76.5 kg (168 lb 10.4 oz)    Examination:  General exam: sleepy, declines to participate in conversation Respiratory system: no increase work of breathing Cardiovascular system: rrr Gastrointestinal system: +Bs, soft Central nervous system: sleeping,will awaken but chooses not to participate with exam Skin: upper extremities covered in scabs     Data Reviewed: I have personally reviewed following labs and  imaging studies  CBC: Recent Labs  Lab 03/07/17 1333 03/08/17 0403  WBC 5.0 4.6  NEUTROABS 3.3 4.2  HGB 10.5* 10.8*  HCT 34.3* 36.0  MCV 88.6 89.3  PLT 167 162   Basic Metabolic Panel: Recent Labs  Lab 03/07/17 1333 03/08/17 0403  NA 138 136  K 3.4* 3.7  CL 97* 98*  CO2 27 27  GLUCOSE 89 175*  BUN <5* 5*  CREATININE 0.37* 0.51  CALCIUM 7.6* 8.2*   GFR: Estimated Creatinine Clearance: 74.3 mL/min (by C-G formula based on SCr of 0.51 mg/dL). Liver Function Tests: Recent Labs  Lab 03/07/17 1333  AST 51*  ALT 21  ALKPHOS 157*  BILITOT 0.7  PROT 6.2*  ALBUMIN 3.2*    Recent Labs  Lab 03/07/17 1840  LIPASE 29   Recent Labs  Lab 03/07/17 1333  AMMONIA 44*   Coagulation Profile: Recent Labs  Lab 03/07/17 1333  INR 1.18   Cardiac Enzymes: No results for input(s): CKTOTAL, CKMB, CKMBINDEX, TROPONINI in the last 168 hours. BNP (last 3 results) No results for input(s): PROBNP in the last 8760 hours. HbA1C: No results for input(s): HGBA1C in the last 72 hours. CBG: No results for input(s): GLUCAP in the last 168 hours. Lipid Profile: No results for input(s): CHOL, HDL, LDLCALC, TRIG, CHOLHDL, LDLDIRECT in the last 72 hours. Thyroid Function Tests: No results for input(s): TSH, T4TOTAL, FREET4, T3FREE, THYROIDAB in the last 72 hours. Anemia Panel: No results for input(s): VITAMINB12, FOLATE, FERRITIN, TIBC, IRON, RETICCTPCT in the last 72 hours. Urine analysis:    Component Value Date/Time   COLORURINE STRAW (A) 12/29/2016 2130   APPEARANCEUR CLEAR 12/29/2016 2130   LABSPEC 1.005 12/29/2016 2130   PHURINE 7.0 12/29/2016 2130   GLUCOSEU NEGATIVE 12/29/2016 2130   HGBUR NEGATIVE 12/29/2016 2130   BILIRUBINUR NEGATIVE 12/29/2016 2130   BILIRUBINUR moderate (A) 06/19/2015 1906   KETONESUR NEGATIVE 12/29/2016 2130   PROTEINUR NEGATIVE 12/29/2016 2130   UROBILINOGEN >=8.0 06/19/2015 1906   UROBILINOGEN 0.2 09/14/2011 2141   NITRITE NEGATIVE 12/29/2016 2130   LEUKOCYTESUR NEGATIVE 12/29/2016 2130      Recent Results (from the past 240 hour(s))  Blood culture (routine x 2)     Status: None (Preliminary result)   Collection Time: 03/07/17  8:15 PM  Result Value Ref Range Status   Specimen Description BLOOD RIGHT HAND  Final   Special Requests   Final    BOTTLES DRAWN AEROBIC AND ANAEROBIC Blood Culture adequate volume   Culture NO GROWTH < 12 HOURS  Final   Report Status PENDING  Incomplete  Blood culture (routine x 2)     Status: None (Preliminary result)   Collection Time: 03/07/17  8:15 PM  Result Value Ref Range Status    Specimen Description BLOOD LEFT ANTECUBITAL  Final   Special Requests   Final    BOTTLES DRAWN AEROBIC AND ANAEROBIC Blood Culture adequate volume   Culture NO GROWTH < 12 HOURS  Final   Report Status PENDING  Incomplete      Anti-infectives (From admission, onward)   Start     Dose/Rate Route Frequency Ordered Stop   03/08/17 2200  azithromycin (ZITHROMAX) tablet 500 mg     500 mg Oral Daily at bedtime 03/08/17 1033 03/14/17 2159   03/08/17 0430  vancomycin (VANCOCIN) IVPB 1000 mg/200 mL premix  Status:  Discontinued     1,000 mg 200 mL/hr over 60 Minutes Intravenous Every 8 hours 03/07/17 2006 03/07/17 2037  03/08/17 0230  piperacillin-tazobactam (ZOSYN) IVPB 3.375 g  Status:  Discontinued     3.375 g 12.5 mL/hr over 240 Minutes Intravenous Every 8 hours 03/07/17 2006 03/07/17 2037   03/07/17 2345  rifaximin (XIFAXAN) tablet 550 mg     550 mg Oral 2 times daily 03/07/17 2037     03/07/17 2230  cefTRIAXone (ROCEPHIN) 1 g in dextrose 5 % 50 mL IVPB     1 g 100 mL/hr over 30 Minutes Intravenous Every 24 hours 03/07/17 2037 03/14/17 2229   03/07/17 2045  azithromycin (ZITHROMAX) 500 mg in dextrose 5 % 250 mL IVPB  Status:  Discontinued     500 mg 250 mL/hr over 60 Minutes Intravenous Every 24 hours 03/07/17 2037 03/08/17 1032   03/07/17 2015  vancomycin (VANCOCIN) 1,500 mg in sodium chloride 0.9 % 500 mL IVPB  Status:  Discontinued     1,500 mg 250 mL/hr over 120 Minutes Intravenous  Once 03/07/17 2006 03/07/17 2037   03/07/17 2015  piperacillin-tazobactam (ZOSYN) IVPB 3.375 g  Status:  Discontinued     3.375 g 100 mL/hr over 30 Minutes Intravenous  Once 03/07/17 2006 03/07/17 2037       Radiology Studies: Ct Head Wo Contrast  Result Date: 03/07/2017 CLINICAL DATA:  Expressive aphasia. Altered mental status. History of multiple sclerosis. EXAM: CT HEAD WITHOUT CONTRAST TECHNIQUE: Contiguous axial images were obtained from the base of the skull through the vertex without  intravenous contrast. COMPARISON:  12/26/2015 head CT and 08/02/2015 MRI FINDINGS: Brain: There is no evidence of acute infarct, intracranial hemorrhage, mass, midline shift, or extra-axial fluid collection. Generalized cerebral atrophy is unchanged and mildly advanced for age. Patchy to confluent bilateral cerebral white matter hypodensities are similar to the prior CT and moderate for age. Vascular: Calcified atherosclerosis at the skull base. No hyperdense vessel. Skull: No fracture or focal osseous lesion. Sinuses/Orbits: Minimal scattered paranasal sinus mucosal thickening. Underdeveloped mastoid air cells bilaterally. Unremarkable orbits. Other: None. IMPRESSION: 1. No evidence of acute intracranial abnormality. 2. Similar appearance of chronic white matter disease and cerebral atrophy in this patient with a history of multiple sclerosis. Electronically Signed   By: Sebastian Ache M.D.   On: 03/07/2017 14:39   Dg Chest Port 1 View  Result Date: 03/07/2017 CLINICAL DATA:  Shortness of breath, current smoker. EXAM: PORTABLE CHEST 1 VIEW COMPARISON:  CT scan of the chest of January 18, 2017 and chest x-ray of January 02, 2017. FINDINGS: The lungs are adequately inflated. There are coarse lung markings at the left base. The heart is enlarged. The pulmonary vascularity is not clearly engorged. There are bilateral breast implants present. IMPRESSION: COPD-smoking related changes. Coarse linear densities at the left lung base compatible with subsegmental atelectasis or early pneumonia. Followup PA and lateral chest X-ray is recommended in 3-4 weeks following trial of antibiotic therapy to ensure resolution and exclude underlying malignancy. Electronically Signed   By: David  [place] M.D.   On: 03/07/2017 14:01        Scheduled Meds: . azithromycin  500 mg Oral QHS  . folic acid  1 mg Oral Daily  . furosemide  40 mg Oral Daily  . [START ON 03/09/2017] Influenza vac split quadrivalent PF  0.5 mL  Intramuscular Tomorrow-1000  . ipratropium-albuterol  3 mL Nebulization Q6H  . multivitamin with minerals  1 tablet Oral Daily  . nicotine  14 mg Transdermal Daily  . pantoprazole  40 mg Oral Daily  . [START ON 03/09/2017] pneumococcal  23 valent vaccine  0.5 mL Intramuscular Tomorrow-1000  . predniSONE  30 mg Oral BID WC  . rifaximin  550 mg Oral BID  . risperiDONE  0.5 mg Oral BID  . sodium chloride flush  3 mL Intravenous Q12H  . spironolactone  100 mg Oral Daily  . thiamine  100 mg Oral Daily   Continuous Infusions: . sodium chloride    . cefTRIAXone (ROCEPHIN)  IV 1 g (03/08/17 0240)     LOS: 0 days    Time spent: 35 min    Joseph Art, DO [facility] Pager [phone]  If 7PM-7AM, please contact night-coverage [email or web address] Password [email or web address] 03/08/2017, 1:33 PM

## 2017-03-08 NOTE — Progress Notes (Signed)
Attempted to reach [facility] for report. No answer at this time.

## 2017-03-08 NOTE — Progress Notes (Signed)
Called ED to receive report. No answer from [facility]. Will wait for report

## 2017-03-09 DIAGNOSIS — F10920 Alcohol use, unspecified with intoxication, uncomplicated: Secondary | ICD-10-CM

## 2017-03-09 MED ORDER — HYOSCYAMINE SULFATE 0.125 MG SL SUBL
0.2500 mg | SUBLINGUAL_TABLET | Freq: Once | SUBLINGUAL | Status: AC
  Administered 2017-03-09: 0.25 mg via SUBLINGUAL
  Filled 2017-03-09: qty 2

## 2017-03-09 NOTE — Progress Notes (Signed)
CSW provided ETOH resources on patient's shadow chart for discharge.   CSW signing off.  Osborne Casco Brodie Scovell LCSW [phone]

## 2017-03-09 NOTE — Progress Notes (Signed)
PROGRESS NOTE    Anne Black  ZOX:[id number] DOB: 19-Dec-1960 DOA: 03/07/2017 PCP: Patient, No Pcp Per   Outpatient Specialists:     Brief Narrative:  Anne Black is a 57 y.o. female with medical history significant of bipolar disorder, chronic alcohol abuse, cirrhosis of the liver, multiple sclerosis, ADHD, polysubstance abuse, comes in with stuttering along with nausea and vomiting and cough and shortness of breath.  Patient was in the process of being discharged when she got into an altercation with staff.  She was then noted to be short of breath and wheezing and hypoxic with O2 sats in the upper 80s.  Patient reports she has been told in the past she needs home oxygen.  She does not have home oxygen this is never been arranged.  She reports that she has been coughing.  She is still drinking daily.  She denies any fevers.  She is still smoking.  Patient denies any diarrhea.  Patient referred for COPD exacerbation and possible pneumonia.  She has been given vancomycin and Zosyn.  Chest x-ray does not show clear infiltrate.       Assessment & Plan:   Principal Problem:   Acute respiratory failure (HCC) Active Problems:   Noncompliance with therapeutic plan   Stimulant abuse (HCC)   Alcoholic cirrhosis of liver with ascites (HCC)   Bipolar I disorder (HCC)   Alcohol abuse with alcohol-induced mood disorder (HCC)   Non-intractable vomiting with nausea   Hypoxia    Acute respiratory failure (HCC) due to CAP -unclear if this is chronic or not.   -treat PNA and then recheck, patient continues to smoke -IV abx -prednisone -suspect may need home O2-- has been low in the past  Tobacco abuse -encourage cessation -patch ordered    Noncompliance with therapeutic plan -multiple providers/dismissals from practices    Stimulant abuse (HCC)- -outpatient follow-up for these medications -per board of pharmacy has not filled recently    Alcoholic cirrhosis of liver with ascites  (HCC) -monitor -resume meds    Bipolar I disorder (HCC) -need outpatient follow up    Alcohol abuse with alcohol-induced mood disorder (HCC) -CIWA -social work consult- plans to go to rehab    Non-intractable vomiting with nausea -PRN Zofran        DVT prophylaxis:  SCD's  Code Status: Full Code   Family Communication:   Disposition Plan:  Home in AM with O2   Consultants:    Subjective: Asking for stimulants- vyvanse and provigal  Objective: Vitals:   03/08/17 1600 03/08/17 1928 03/08/17 2120 03/09/17 0435  BP: 132/68  125/76 128/62  Pulse: 87 94 93 82  Resp:  18 19 18   Temp:   98.4 F (36.9 C) 98.8 F (37.1 C)  TempSrc:   Oral Oral  SpO2:  98% 92% 97%  Weight:      Height:        Intake/Output Summary (Last 24 hours) at 03/09/2017 1501 Last data filed at 03/09/2017 0930 Gross per 24 hour  Intake 250 ml  Output -  Net 250 ml   Filed Weights   03/07/17 1900 03/08/17 8119  Weight: 80.3 kg (177 lb 0.5 oz) 76.5 kg (168 lb 10.4 oz)    Examination:  General exam: awake, more conversant Respiratory system: wheezes Cardiovascular system: rrr Gastrointestinal system: +Bs, soft Skin: scabs all over arms     Data Reviewed: I have personally reviewed following labs and imaging studies  CBC: Recent Labs  Lab 03/07/17  1333 03/08/17 0403  WBC 5.0 4.6  NEUTROABS 3.3 4.2  HGB 10.5* 10.8*  HCT 34.3* 36.0  MCV 88.6 89.3  PLT 167 162   Basic Metabolic Panel: Recent Labs  Lab 03/07/17 1333 03/08/17 0403  NA 138 136  K 3.4* 3.7  CL 97* 98*  CO2 27 27  GLUCOSE 89 175*  BUN <5* 5*  CREATININE 0.37* 0.51  CALCIUM 7.6* 8.2*   GFR: Estimated Creatinine Clearance: 74.3 mL/min (by C-G formula based on SCr of 0.51 mg/dL). Liver Function Tests: Recent Labs  Lab 03/07/17 1333  AST 51*  ALT 21  ALKPHOS 157*  BILITOT 0.7  PROT 6.2*  ALBUMIN 3.2*   Recent Labs  Lab 03/07/17 1840  LIPASE 29   Recent Labs  Lab 03/07/17 1333  03/08/17 1316  AMMONIA 44* 52*   Coagulation Profile: Recent Labs  Lab 03/07/17 1333  INR 1.18   Cardiac Enzymes: No results for input(s): CKTOTAL, CKMB, CKMBINDEX, TROPONINI in the last 168 hours. BNP (last 3 results) No results for input(s): PROBNP in the last 8760 hours. HbA1C: No results for input(s): HGBA1C in the last 72 hours. CBG: No results for input(s): GLUCAP in the last 168 hours. Lipid Profile: No results for input(s): CHOL, HDL, LDLCALC, TRIG, CHOLHDL, LDLDIRECT in the last 72 hours. Thyroid Function Tests: No results for input(s): TSH, T4TOTAL, FREET4, T3FREE, THYROIDAB in the last 72 hours. Anemia Panel: No results for input(s): VITAMINB12, FOLATE, FERRITIN, TIBC, IRON, RETICCTPCT in the last 72 hours. Urine analysis:    Component Value Date/Time   COLORURINE STRAW (A) 12/29/2016 2130   APPEARANCEUR CLEAR 12/29/2016 2130   LABSPEC 1.005 12/29/2016 2130   PHURINE 7.0 12/29/2016 2130   GLUCOSEU NEGATIVE 12/29/2016 2130   HGBUR NEGATIVE 12/29/2016 2130   BILIRUBINUR NEGATIVE 12/29/2016 2130   BILIRUBINUR moderate (A) 06/19/2015 1906   KETONESUR NEGATIVE 12/29/2016 2130   PROTEINUR NEGATIVE 12/29/2016 2130   UROBILINOGEN >=8.0 06/19/2015 1906   UROBILINOGEN 0.2 09/14/2011 2141   NITRITE NEGATIVE 12/29/2016 2130   LEUKOCYTESUR NEGATIVE 12/29/2016 2130      Recent Results (from the past 240 hour(s))  Blood culture (routine x 2)     Status: None (Preliminary result)   Collection Time: 03/07/17  8:15 PM  Result Value Ref Range Status   Specimen Description BLOOD RIGHT HAND  Final   Special Requests   Final    BOTTLES DRAWN AEROBIC AND ANAEROBIC Blood Culture adequate volume   Culture NO GROWTH 2 DAYS  Final   Report Status PENDING  Incomplete  Blood culture (routine x 2)     Status: None (Preliminary result)   Collection Time: 03/07/17  8:15 PM  Result Value Ref Range Status   Specimen Description BLOOD LEFT ANTECUBITAL  Final   Special Requests    Final    BOTTLES DRAWN AEROBIC AND ANAEROBIC Blood Culture adequate volume   Culture NO GROWTH 2 DAYS  Final   Report Status PENDING  Incomplete      Anti-infectives (From admission, onward)   Start     Dose/Rate Route Frequency Ordered Stop   03/08/17 2200  azithromycin (ZITHROMAX) tablet 500 mg     500 mg Oral Daily at bedtime 03/08/17 1033 03/14/17 2159   03/08/17 0430  vancomycin (VANCOCIN) IVPB 1000 mg/200 mL premix  Status:  Discontinued     1,000 mg 200 mL/hr over 60 Minutes Intravenous Every 8 hours 03/07/17 2006 03/07/17 2037   03/08/17 0230  piperacillin-tazobactam (ZOSYN) IVPB 3.375 g  Status:  Discontinued     3.375 g 12.5 mL/hr over 240 Minutes Intravenous Every 8 hours 03/07/17 2006 03/07/17 2037   03/07/17 2345  rifaximin (XIFAXAN) tablet 550 mg     550 mg Oral 2 times daily 03/07/17 2037     03/07/17 2230  cefTRIAXone (ROCEPHIN) 1 g in dextrose 5 % 50 mL IVPB     1 g 100 mL/hr over 30 Minutes Intravenous Every 24 hours 03/07/17 2037 03/14/17 2229   03/07/17 2045  azithromycin (ZITHROMAX) 500 mg in dextrose 5 % 250 mL IVPB  Status:  Discontinued     500 mg 250 mL/hr over 60 Minutes Intravenous Every 24 hours 03/07/17 2037 03/08/17 1032   03/07/17 2015  vancomycin (VANCOCIN) 1,500 mg in sodium chloride 0.9 % 500 mL IVPB  Status:  Discontinued     1,500 mg 250 mL/hr over 120 Minutes Intravenous  Once 03/07/17 2006 03/07/17 2037   03/07/17 2015  piperacillin-tazobactam (ZOSYN) IVPB 3.375 g  Status:  Discontinued     3.375 g 100 mL/hr over 30 Minutes Intravenous  Once 03/07/17 2006 03/07/17 2037       Radiology Studies: No results found.      Scheduled Meds: . azithromycin  500 mg Oral QHS  . folic acid  1 mg Oral Daily  . furosemide  40 mg Oral Daily  . ipratropium-albuterol  3 mL Nebulization TID  . multivitamin with minerals  1 tablet Oral Daily  . nicotine  14 mg Transdermal Daily  . pantoprazole  40 mg Oral Daily  . pneumococcal 23 valent vaccine  0.5  mL Intramuscular Tomorrow-1000  . predniSONE  30 mg Oral BID WC  . rifaximin  550 mg Oral BID  . risperiDONE  0.5 mg Oral BID  . sodium chloride flush  3 mL Intravenous Q12H  . spironolactone  100 mg Oral Daily  . thiamine  100 mg Oral Daily   Continuous Infusions: . sodium chloride    . cefTRIAXone (ROCEPHIN)  IV Stopped (03/09/17 0011)     LOS: 1 day    Time spent: 35 min    Joseph Art, DO [facility] Pager [phone]  If 7PM-7AM, please contact night-coverage [email or web address] Password [email or web address] 03/09/2017, 3:01 PM

## 2017-03-09 NOTE — Progress Notes (Signed)
SATURATION QUALIFICATIONS: (This note is used to comply with regulatory documentation for home oxygen)  Patient Saturations on Room Air at Rest = 88%  Patient Saturations on Room Air while Ambulating = 85%  Patient Saturations on 3 Liters of oxygen while Ambulating = 90%

## 2017-03-10 MED ORDER — ACETAMINOPHEN 325 MG PO TABS
650.0000 mg | ORAL_TABLET | Freq: Four times a day (QID) | ORAL | Status: DC | PRN
Start: 2017-03-10 — End: 2017-03-11
  Administered 2017-03-10 – 2017-03-11 (×3): 650 mg via ORAL
  Filled 2017-03-10 (×3): qty 2

## 2017-03-10 MED ORDER — IPRATROPIUM-ALBUTEROL 0.5-2.5 (3) MG/3ML IN SOLN: 3.0000 mL | Freq: Four times a day (QID) | RESPIRATORY_TRACT | Status: DC | PRN

## 2017-03-10 MED ORDER — CAMPHOR-MENTHOL 0.5-0.5 % EX LOTN
TOPICAL_LOTION | CUTANEOUS | Status: DC | PRN
  Filled 2017-03-10 (×2): qty 222

## 2017-03-10 MED ORDER — PREDNISONE 20 MG PO TABS
40.0000 mg | ORAL_TABLET | Freq: Every day | ORAL | Status: DC
  Administered 2017-03-11: 40 mg via ORAL
  Filled 2017-03-10: qty 2

## 2017-03-10 NOTE — Progress Notes (Signed)
RN paged Merdis Delay, NP and made her aware patient is having cramping pain in her abdomen and has had Tramadol earlier, Levsin ordered per NP. P.J. Henderson Newcomer, RN

## 2017-03-10 NOTE — Progress Notes (Addendum)
PROGRESS NOTE    Anne Black  ZHY:[id number] DOB: 1960/04/16 DOA: 03/07/2017 PCP: Patient, No Pcp Per   Outpatient Specialists:     Brief Narrative:  Anne Black is a 57 y.o. female with medical history significant of bipolar disorder, chronic alcohol abuse, cirrhosis of the liver, multiple sclerosis, ADHD, polysubstance abuse, comes in with stuttering along with nausea and vomiting and cough and shortness of breath.  Patient was in the process of being discharged when she got into an altercation with staff.  She was then noted to be short of breath and wheezing and hypoxic with O2 sats in the upper 80s.  Patient reports she has been told in the past she needs home oxygen.  She does not have home oxygen this is never been arranged.  being treated for PNA, now saying she needs SNF.  Social work consulted.  Avoid narcotics/stimulants.       Assessment & Plan:   Principal Problem:   Acute respiratory failure (HCC) Active Problems:   Noncompliance with therapeutic plan   Stimulant abuse (HCC)   Alcoholic cirrhosis of liver with ascites (HCC)   Bipolar I disorder (HCC)   Alcohol abuse with alcohol-induced mood disorder (HCC)   Non-intractable vomiting with nausea   Hypoxia    Acute respiratory failure (HCC) due to CAP -unclear if this is chronic or not.   -treat PNA-- will need outpatient dg chest to ensure resolution -IV abx -prednisone x 5 days -suspect may need chronic O2-- has been low in the past  Tobacco abuse -encourage cessation -patch ordered    Noncompliance with therapeutic plan -multiple providers/dismissals from practices    reported Stimulant abuse (HCC)- -outpatient follow-up for these medications -per board of pharmacy has not filled recently as no one will prescribe for her -will not give here    Alcoholic cirrhosis of liver with ascites (HCC) -monitor -resume meds    Bipolar I disorder (HCC) -need outpatient follow up    Alcohol abuse with  alcohol-induced mood disorder (HCC) -CIWA -social work consult- plans to go to rehab    Non-intractable vomiting with nausea -PRN Zofran   Patient now stating she needs to go to SNF-- suspect may refuse once bed available ala last hospitalization  WOC consult to abdominal wound--     DVT prophylaxis:  SCD's  Code Status: Full Code   Family Communication:   Disposition Plan:  Home in AM with O2   Consultants:    Subjective: Now asking for "skin-lightening" cream  Objective: Vitals:   03/09/17 2300 03/10/17 0000 03/10/17 0524 03/10/17 0840  BP: 126/80 117/72 (!) 117/57   Pulse: 88 92 89   Resp: (!) 24 20 16    Temp: 98.1 F (36.7 C)  98.9 F (37.2 C)   TempSrc:   Axillary   SpO2: 94% 95% 94% 90%  Weight:      Height:        Intake/Output Summary (Last 24 hours) at 03/10/2017 1350 Last data filed at 03/10/2017 1244 Gross per 24 hour  Intake -  Output 800 ml  Net -800 ml   Filed Weights   03/07/17 1900 03/08/17 2952  Weight: 80.3 kg (177 lb 0.5 oz) 76.5 kg (168 lb 10.4 oz)    Examination:  General exam: awake, sitting on side of bed-- off O2 Respiratory system: no wheezing, no increase work of breathing Cardiovascular system: rrr Gastrointestinal system: +Bs, soft Skin: scabs on upper extremities, some bleeding from recent "picking"  Data Reviewed: I have personally reviewed following labs and imaging studies  CBC: Recent Labs  Lab 03/07/17 1333 03/08/17 0403  WBC 5.0 4.6  NEUTROABS 3.3 4.2  HGB 10.5* 10.8*  HCT 34.3* 36.0  MCV 88.6 89.3  PLT 167 162   Basic Metabolic Panel: Recent Labs  Lab 03/07/17 1333 03/08/17 0403  NA 138 136  K 3.4* 3.7  CL 97* 98*  CO2 27 27  GLUCOSE 89 175*  BUN <5* 5*  CREATININE 0.37* 0.51  CALCIUM 7.6* 8.2*   GFR: Estimated Creatinine Clearance: 74.3 mL/min (by C-G formula based on SCr of 0.51 mg/dL). Liver Function Tests: Recent Labs  Lab 03/07/17 1333  AST 51*  ALT 21  ALKPHOS 157*    BILITOT 0.7  PROT 6.2*  ALBUMIN 3.2*   Recent Labs  Lab 03/07/17 1840  LIPASE 29   Recent Labs  Lab 03/07/17 1333 03/08/17 1316  AMMONIA 44* 52*   Coagulation Profile: Recent Labs  Lab 03/07/17 1333  INR 1.18   Cardiac Enzymes: No results for input(s): CKTOTAL, CKMB, CKMBINDEX, TROPONINI in the last 168 hours. BNP (last 3 results) No results for input(s): PROBNP in the last 8760 hours. HbA1C: No results for input(s): HGBA1C in the last 72 hours. CBG: No results for input(s): GLUCAP in the last 168 hours. Lipid Profile: No results for input(s): CHOL, HDL, LDLCALC, TRIG, CHOLHDL, LDLDIRECT in the last 72 hours. Thyroid Function Tests: No results for input(s): TSH, T4TOTAL, FREET4, T3FREE, THYROIDAB in the last 72 hours. Anemia Panel: No results for input(s): VITAMINB12, FOLATE, FERRITIN, TIBC, IRON, RETICCTPCT in the last 72 hours. Urine analysis:    Component Value Date/Time   COLORURINE STRAW (A) 12/29/2016 2130   APPEARANCEUR CLEAR 12/29/2016 2130   LABSPEC 1.005 12/29/2016 2130   PHURINE 7.0 12/29/2016 2130   GLUCOSEU NEGATIVE 12/29/2016 2130   HGBUR NEGATIVE 12/29/2016 2130   BILIRUBINUR NEGATIVE 12/29/2016 2130   BILIRUBINUR moderate (A) 06/19/2015 1906   KETONESUR NEGATIVE 12/29/2016 2130   PROTEINUR NEGATIVE 12/29/2016 2130   UROBILINOGEN >=8.0 06/19/2015 1906   UROBILINOGEN 0.2 09/14/2011 2141   NITRITE NEGATIVE 12/29/2016 2130   LEUKOCYTESUR NEGATIVE 12/29/2016 2130      Recent Results (from the past 240 hour(s))  Blood culture (routine x 2)     Status: None (Preliminary result)   Collection Time: 03/07/17  8:15 PM  Result Value Ref Range Status   Specimen Description BLOOD RIGHT HAND  Final   Special Requests   Final    BOTTLES DRAWN AEROBIC AND ANAEROBIC Blood Culture adequate volume   Culture NO GROWTH 3 DAYS  Final   Report Status PENDING  Incomplete  Blood culture (routine x 2)     Status: None (Preliminary result)   Collection Time:  03/07/17  8:15 PM  Result Value Ref Range Status   Specimen Description BLOOD LEFT ANTECUBITAL  Final   Special Requests   Final    BOTTLES DRAWN AEROBIC AND ANAEROBIC Blood Culture adequate volume   Culture NO GROWTH 3 DAYS  Final   Report Status PENDING  Incomplete      Anti-infectives (From admission, onward)   Start     Dose/Rate Route Frequency Ordered Stop   03/08/17 2200  azithromycin (ZITHROMAX) tablet 500 mg     500 mg Oral Daily at bedtime 03/08/17 1033 03/14/17 2159   03/08/17 0430  vancomycin (VANCOCIN) IVPB 1000 mg/200 mL premix  Status:  Discontinued     1,000 mg 200 mL/hr over 60  Minutes Intravenous Every 8 hours 03/07/17 2006 03/07/17 2037   03/08/17 0230  piperacillin-tazobactam (ZOSYN) IVPB 3.375 g  Status:  Discontinued     3.375 g 12.5 mL/hr over 240 Minutes Intravenous Every 8 hours 03/07/17 2006 03/07/17 2037   03/07/17 2345  rifaximin (XIFAXAN) tablet 550 mg     550 mg Oral 2 times daily 03/07/17 2037     03/07/17 2230  cefTRIAXone (ROCEPHIN) 1 g in dextrose 5 % 50 mL IVPB     1 g 100 mL/hr over 30 Minutes Intravenous Every 24 hours 03/07/17 2037 03/14/17 2229   03/07/17 2045  azithromycin (ZITHROMAX) 500 mg in dextrose 5 % 250 mL IVPB  Status:  Discontinued     500 mg 250 mL/hr over 60 Minutes Intravenous Every 24 hours 03/07/17 2037 03/08/17 1032   03/07/17 2015  vancomycin (VANCOCIN) 1,500 mg in sodium chloride 0.9 % 500 mL IVPB  Status:  Discontinued     1,500 mg 250 mL/hr over 120 Minutes Intravenous  Once 03/07/17 2006 03/07/17 2037   03/07/17 2015  piperacillin-tazobactam (ZOSYN) IVPB 3.375 g  Status:  Discontinued     3.375 g 100 mL/hr over 30 Minutes Intravenous  Once 03/07/17 2006 03/07/17 2037       Radiology Studies: No results found.      Scheduled Meds: . azithromycin  500 mg Oral QHS  . folic acid  1 mg Oral Daily  . furosemide  40 mg Oral Daily  . ipratropium-albuterol  3 mL Nebulization TID  . multivitamin with minerals  1  tablet Oral Daily  . nicotine  14 mg Transdermal Daily  . pantoprazole  40 mg Oral Daily  . pneumococcal 23 valent vaccine  0.5 mL Intramuscular Tomorrow-1000  . predniSONE  30 mg Oral BID WC  . rifaximin  550 mg Oral BID  . risperiDONE  0.5 mg Oral BID  . sodium chloride flush  3 mL Intravenous Q12H  . spironolactone  100 mg Oral Daily  . thiamine  100 mg Oral Daily   Continuous Infusions: . sodium chloride    . cefTRIAXone (ROCEPHIN)  IV Stopped (03/09/17 2253)     LOS: 2 days    Time spent: 35 min    Joseph Art, DO [facility] Pager [phone]  If 7PM-7AM, please contact night-coverage [email or web address] Password [email or web address] 03/10/2017, 1:50 PM

## 2017-03-10 NOTE — Progress Notes (Signed)
SATURATION QUALIFICATIONS: (This note is used to comply with regulatory documentation for home oxygen)  Patient Saturations on Room Air at Rest =90%  Patient Saturations on Room Air while Ambulating = 90%  Patient Saturations on 0Liters of oxygen while Ambulating = 90%  Please briefly explain why patient needs home oxygen: 

## 2017-03-10 NOTE — Evaluation (Addendum)
Physical Therapy Evaluation Patient Details Name: Anne Black MRN: [id number] DOB: Oct 18, 1960 Today's Date: 03/10/2017   History of Present Illness  Pt is a 57 y/o female admitted secondary to acute respiratory failure and stuttered speech. PMH inlcudes bipolar disorder, MS, alcohol abuse, tobacco abuse, and s/p ORIF of wrist fx.   Clinical Impression  Pt admitted secondary to problem above with deficits below. Pt requiring min guard to supervision with mobility this session and deficits seemed to fluctuate with pt's distractibility this session. Pt also reporting dizziness which limited distance. VSS throughout. Pt reports she feels weaker than normal and is concerned about going home, reporting she does not want to fall. Reports she has had a fall within the past 6 months. Feels she would benefit from post acute rehab for strengthening prior to return home. Will continue to follow acutely to maximize functional mobility independence and safety.     Follow Up Recommendations SNF    Equipment Recommendations  3in1 (PT)    Recommendations for Other Services       Precautions / Restrictions Precautions Precautions: Fall Precaution Comments: Pt reports 1 fall within the past 6 months.  Restrictions Weight Bearing Restrictions: No      Mobility  Bed Mobility Overal bed mobility: Modified Independent             General bed mobility comments: Increased time, no assist required.   Transfers Overall transfer level: Needs assistance Equipment used: None Transfers: Sit to/from Stand Sit to Stand: Supervision         General transfer comment: Supervision for safety.   Ambulation/Gait Ambulation/Gait assistance: Supervision;Min guard Ambulation Distance (Feet): 125 Feet Assistive device: None;Rolling walker (2 wheeled) Gait Pattern/deviations: Step-through pattern;Decreased stride length Gait velocity: Decreased  Gait velocity interpretation: Below normal speed for  age/gender General Gait Details: Pt supervision with gait in room. Pt deficits changed as pt distracted. When pt focused on task, requested to use RW. Pt requiring supervision initially, however, with increased distance, pt reporting dizziness. Min guard for safety with dizziness symptoms.   Stairs            Wheelchair Mobility    Modified Rankin (Stroke Patients Only)       Balance Overall balance assessment: Needs assistance Sitting-balance support: No upper extremity supported;Feet supported Sitting balance-Leahy Scale: Good     Standing balance support: Bilateral upper extremity supported;No upper extremity supported;During functional activity Standing balance-Leahy Scale: Fair Standing balance comment: Able to ambulate without LOB when distracted without use of DME.                              Pertinent Vitals/Pain Pain Assessment: Faces Pain Score: 0-No pain    Home Living Family/patient expects to be discharged to:: Private residence Living Arrangements: Alone Available Help at Discharge: Family;Available PRN/intermittently Type of Home: House Home Access: Stairs to enter Entrance Stairs-Rails: None Entrance Stairs-Number of Steps: 2 Home Layout: Two level;Able to live on main level with bedroom/bathroom Home Equipment: Walker - 2 wheels      Prior Function Level of Independence: Independent with assistive device(s)         Comments: Uses RW occasionally; otherwise independence.      Hand Dominance   Dominant Hand: Right    Extremity/Trunk Assessment   Upper Extremity Assessment Upper Extremity Assessment: Generalized weakness    Lower Extremity Assessment Lower Extremity Assessment: Generalized weakness    Cervical / Trunk Assessment  Cervical / Trunk Assessment: Normal  Communication   Communication: No difficulties  Cognition Arousal/Alertness: Awake/alert Behavior During Therapy: WFL for tasks assessed/performed Overall  Cognitive Status: Within Functional Limits for tasks assessed                                        General Comments General comments (skin integrity, edema, etc.): Pt concerned about going home given weakness and dizziness. Reports she has more deficits than before she came in and is interested in SNF.     Exercises     Assessment/Plan    PT Assessment Patient needs continued PT services  PT Problem List Decreased strength;Decreased activity tolerance;Decreased mobility;Decreased knowledge of use of DME       PT Treatment Interventions Gait training;DME instruction;Stair training;Functional mobility training;Therapeutic exercise;Balance training;Therapeutic activities;Neuromuscular re-education;Patient/family education    PT Goals (Current goals can be found in the Care Plan section)  Acute Rehab PT Goals Patient Stated Goal: to go to SNF at d/c  PT Goal Formulation: With patient Time For Goal Achievement: 03/24/17 Potential to Achieve Goals: Good    Frequency Min 2X/week   Barriers to discharge Decreased caregiver support Lives alone     Co-evaluation               AM-PAC PT "6 Clicks" Daily Activity  Outcome Measure Difficulty turning over in bed (including adjusting bedclothes, sheets and blankets)?: A Little Difficulty moving from lying on back to sitting on the side of the bed? : A Little Difficulty sitting down on and standing up from a chair with arms (e.g., wheelchair, bedside commode, etc,.)?: A Little Help needed moving to and from a bed to chair (including a wheelchair)?: A Little Help needed walking in hospital room?: A Little Help needed climbing 3-5 steps with a railing? : A Little 6 Click Score: 18    End of Session   Activity Tolerance: Treatment limited secondary to medical complications (Comment)(dizziness ) Patient left: in chair;with call bell/phone within reach;with chair alarm set Nurse Communication: Mobility status;Other  (comment)(dizziness ) PT Visit Diagnosis: Other abnormalities of gait and mobility (R26.89);Muscle weakness (generalized) (M62.81)    Time: 1610-9604 PT Time Calculation (min) (ACUTE ONLY): 25 min   Charges:   PT Evaluation $PT Eval Low Complexity: 1 Low PT Treatments $Gait Training: 8-22 mins   PT G Codes:        Gladys Damme, PT, DPT  Acute Rehabilitation Services  Pager: [phone]   [facility] 03/10/2017, 12:35 PM

## 2017-03-11 DIAGNOSIS — K7031 Alcoholic cirrhosis of liver with ascites: Secondary | ICD-10-CM

## 2017-03-11 DIAGNOSIS — F319 Bipolar disorder, unspecified: Secondary | ICD-10-CM

## 2017-03-11 DIAGNOSIS — J9601 Acute respiratory failure with hypoxia: Secondary | ICD-10-CM

## 2017-03-11 DIAGNOSIS — R0902 Hypoxemia: Secondary | ICD-10-CM

## 2017-03-11 MED ORDER — HYDROCORTISONE 2.5 % EX CREA: 1.0000 "application " | TOPICAL_CREAM | Freq: Three times a day (TID) | CUTANEOUS | 0 refills | Status: AC

## 2017-03-11 MED ORDER — IPRATROPIUM-ALBUTEROL 0.5-2.5 (3) MG/3ML IN SOLN: 3.0000 mL | Freq: Four times a day (QID) | RESPIRATORY_TRACT | 0 refills | Status: DC | PRN

## 2017-03-11 MED ORDER — ACETAMINOPHEN 500 MG PO TABS: 500.0000 mg | ORAL_TABLET | Freq: Four times a day (QID) | ORAL | 0 refills | Status: DC | PRN

## 2017-03-11 MED ORDER — ALPRAZOLAM 0.25 MG PO TABS
0.2500 mg | ORAL_TABLET | Freq: Once | ORAL | Status: AC | PRN
  Administered 2017-03-11: 0.25 mg via ORAL
  Filled 2017-03-11: qty 1

## 2017-03-11 NOTE — Progress Notes (Signed)
CSW met with patient and her mother. CSW explained that patient does not meet criteria for SNF and we are unable to hold her while she finds an ETOH rehab facility to accept her. CSW assisted in faxing consent form to medical records and provided list of ETOH facilities. [facility] and [facility] unable to accept patient due to [employer]. CSW sent fl2 to [facility] place as requested. If [facility] is able to accept her and receive insurance authorization while she is at home, they will contact patient.   Patient will discharge home today.  CSW signing off.  Anne Locus Tehran Rabenold LCSW [phone]

## 2017-03-11 NOTE — Care Management Note (Addendum)
Case Management Note  Patient Details  Name: Anne Black MRN: [id number] Date of Birth: 02-13-1960  Subjective/Objective:    ARF                Teena Irani (Mother)      [phone]        PCP: Heath Gold  Action/Plan:  Transition to home with home health services to follow. Post hospital f/u scheduled with PCP, noted on AVS.  Pt states mom will provide transportation to home.   Expected Discharge Date:  03/11/17               Expected Discharge Plan:  Home w Home Health Services  In-House Referral:   CSW/ETOH resources     Discharge planning Services  CM Consult  Post Acute Care Choice:    Choice offered to:  Patient  DME Arranged:   3 IN1/BSC DME Agency:   [facility]  HH Arranged:  RN, Disease Management, PT, Social Work [employer] Agency:  [facility]  Status of Service:  Completed, signed off  If discussed at [employer], dates discussed:    Additional Comments:  Epifanio Lesches, RN 03/11/2017, 2:25 PM

## 2017-03-11 NOTE — Discharge Summary (Signed)
Physician Discharge Summary  SHARYNE BENEDIX LOV:[id number] DOB: 03-Mar-1960 DOA: 03/07/2017  PCP: Patient, No Pcp Per  Admit date: 03/07/2017 Discharge date: 03/11/2017  Admitted From: Home Disposition:  Home  Recommendations for Outpatient Follow-up:  1. Follow up with PCP in 1-weeks 2. Patient has been placed on Albutertol-ipratropium nebs 3. Will need follow up with AA meetings.  Home Health: yes Equipment/Devices: no   Discharge Condition: Stable CODE STATUS: Full  Diet recommendation:  Heart healthy  Brief/Interim Summary: 57 year old female who presented with nausea, vomiting along with dyspnea, wheezing and cough. She does have the significant past medical history of bipolar disorder, chronic alcohol abuse, cirrhosis of the liver, multiple sclerosis, and polysubstance abuse. Initial physical examination blood pressure 120/56, heart rate 85, respiratory 28, oxygen saturation 91%-80's. Moist mucous membranes, lungs with decreased breath sounds bilaterally, no wheezing, rales or rhonchi, heart S1-S2 present rhythmic, abdomen soft nontender, no lower extremity edema. Sodium 138, potassium 3.4, chloride 97, bicarbonate 27, glucose 89, BUN 5, creatinine 0.37, white count 5.0, glucose 10.5, hematocrit 34.3, platelets 167. Chest x-ray with hyperinflation, left lower lobe atelectasis, right rotation. Urine drug screen negative, alcohol level 181, head CT no acute changes. EKG sinus rhythm, normal axis, normal atrial.   Patient was admitted to the hospital working diagnosis of COPD exacerbation, rule out community-acquired pneumonia, complicated by alcohol intoxication   Discharge Diagnoses:  Principal Problem:   Acute respiratory failure (HCC) Active Problems:   Noncompliance with therapeutic plan   Stimulant abuse (HCC)   Alcoholic cirrhosis of liver with ascites (HCC)   Bipolar I disorder (HCC)   Alcohol abuse with alcohol-induced mood disorder (HCC)   Non-intractable vomiting with  nausea   Hypoxia  1. COPD exacerbation/ rule out committee acquired pneumonia. Patient was admitted to the medical ward, she was placed in the aggressive bronchodilator therapy, systemic steroids and supplemental oxygen per nasal cannula. Further review of her chest x-ray to rule out pneumonia. She initially received ceftriaxone and azithromycin. Patient will be discharge with albuterol, Spiriva and needed to DuoNeb. Aggressive smoking cessation.  2. Alcohol intoxication/ alcohol abuse. Patient was placed on benzodiazepines per protocol, responded well with no significant withdrawals. Bedtime discharge no anxiety or tremors, no nausea or vomiting. Patient will follow-up with alcoholic anonymous meetings.   3. Liver cirrhosis. It has remained stable, no signs of acute decompensation, patient will continue outpatient diuretic therapy with furosemide and Aldactone.  4. Bipolar. Patient will continue fluoxetine, modafinil, risperidone and topiramate.   Discharge Instructions   Allergies as of 03/11/2017   No Known Allergies     Medication List    STOP taking these medications   meloxicam 7.5 MG tablet Commonly known as:  MOBIC   oxyCODONE 5 MG immediate release tablet Commonly known as:  Oxy IR/ROXICODONE     TAKE these medications   acetaminophen 500 MG tablet Commonly known as:  TYLENOL Take 1 tablet (500 mg total) by mouth every 6 (six) hours as needed for mild pain or headache. What changed:  how much to take   albuterol 108 (90 Base) MCG/ACT inhaler Commonly known as:  PROVENTIL HFA;VENTOLIN HFA Inhale 2 puffs into the lungs every 6 (six) hours as needed for wheezing or shortness of breath.   busPIRone 15 MG tablet Commonly known as:  BUSPAR TAKE 1 TABLET(15 MG) BY MOUTH TWICE DAILY What changed:  See the new instructions.   FLUoxetine 20 MG capsule Commonly known as:  PROZAC Take 20 mg by mouth daily.  fluticasone 50 MCG/ACT nasal spray Commonly known as:   FLONASE Place 1 spray into both nostrils as needed for allergies or rhinitis.   Fluticasone-Salmeterol 100-50 MCG/DOSE Aepb Commonly known as:  ADVAIR Inhale 1-2 puffs 2 (two) times daily into the lungs.   furosemide 40 MG tablet Commonly known as:  LASIX Take 1 tablet (40 mg total) by mouth daily.   gabapentin 400 MG capsule Commonly known as:  NEURONTIN Take 1 capsule (400 mg total) 3 (three) times daily by mouth.   hydrocortisone 2.5 % cream Apply 1 application topically 3 (three) times daily. What changed:    when to take this  reasons to take this   ipratropium-albuterol 0.5-2.5 (3) MG/3ML Soln Commonly known as:  DUONEB Take 3 mLs by nebulization every 6 (six) hours as needed.   Melatonin 3 MG Tabs Take 1 tablet (3 mg total) by mouth at bedtime as needed (sleep).   modafinil 200 MG tablet Commonly known as:  PROVIGIL Take 200 mg by mouth daily.   nicotine 21 mg/24hr patch Commonly known as:  NICODERM CQ - dosed in mg/24 hours Place 21 mg onto the skin daily.   ondansetron 4 MG tablet Commonly known as:  ZOFRAN Take 1 tablet (4 mg total) every 8 (eight) hours as needed by mouth for nausea or vomiting.   pantoprazole 40 MG tablet Commonly known as:  PROTONIX Take 1 tablet (40 mg total) by mouth daily.   potassium chloride 20 MEQ/15ML (10%) Soln Take 30 mLs (40 mEq total) by mouth 2 (two) times daily.   rifaximin 550 MG Tabs tablet Commonly known as:  XIFAXAN Take 1 tablet (550 mg total) by mouth 2 (two) times daily.   risperiDONE 0.5 MG tablet Commonly known as:  RISPERDAL Take 1 tablet (0.5 mg total) by mouth 3 (three) times daily as needed (agitation). What changed:  when to take this   rizatriptan 10 MG tablet Commonly known as:  MAXALT Take 1 tablet (10 mg total) by mouth as needed for migraine. May repeat in 2 hours if needed   spironolactone 100 MG tablet Commonly known as:  ALDACTONE Take 1 tablet (100 mg total) by mouth daily.   topiramate  100 MG tablet Commonly known as:  TOPAMAX Take 1 tablet (100 mg total) by mouth daily.            Durable Medical Equipment  (From admission, onward)        Start     Ordered   03/11/17 0000  DME Nebulizer machine    Question:  Patient needs a nebulizer to treat with the following condition  Answer:  COPD exacerbation (HCC)   03/11/17 1425     Follow-up Information    [facility] In 1 day.   Contact information: [address] [place] [address] [phone]         No Known Allergies  Consultations:     Procedures/Studies: Ct Head Wo Contrast  Result Date: 03/07/2017 CLINICAL DATA:  Expressive aphasia. Altered mental status. History of multiple sclerosis. EXAM: CT HEAD WITHOUT CONTRAST TECHNIQUE: Contiguous axial images were obtained from the base of the skull through the vertex without intravenous contrast. COMPARISON:  12/26/2015 head CT and 08/02/2015 MRI FINDINGS: Brain: There is no evidence of acute infarct, intracranial hemorrhage, mass, midline shift, or extra-axial fluid collection. Generalized cerebral atrophy is unchanged and mildly advanced for age. Patchy to confluent bilateral cerebral white matter hypodensities are similar to the prior  CT and moderate for age. Vascular: Calcified atherosclerosis at the skull base. No hyperdense vessel. Skull: No fracture or focal osseous lesion. Sinuses/Orbits: Minimal scattered paranasal sinus mucosal thickening. Underdeveloped mastoid air cells bilaterally. Unremarkable orbits. Other: None. IMPRESSION: 1. No evidence of acute intracranial abnormality. 2. Similar appearance of chronic white matter disease and cerebral atrophy in this patient with a history of multiple sclerosis. Electronically Signed   By: Sebastian Ache M.D.   On: 03/07/2017 14:39   Dg Chest Port 1 View  Result Date: 03/07/2017 CLINICAL DATA:  Shortness of breath, current smoker. EXAM: PORTABLE CHEST 1  VIEW COMPARISON:  CT scan of the chest of January 18, 2017 and chest x-ray of January 02, 2017. FINDINGS: The lungs are adequately inflated. There are coarse lung markings at the left base. The heart is enlarged. The pulmonary vascularity is not clearly engorged. There are bilateral breast implants present. IMPRESSION: COPD-smoking related changes. Coarse linear densities at the left lung base compatible with subsegmental atelectasis or early pneumonia. Followup PA and lateral chest X-ray is recommended in 3-4 weeks following trial of antibiotic therapy to ensure resolution and exclude underlying malignancy. Electronically Signed   By: David  [place] M.D.   On: 03/07/2017 14:01       Subjective: Patient feeling better, dyspnea has improved, no nausea or vomiting, no fever or chills, no abdominal distention.  Discharge Exam: Vitals:   03/10/17 2205 03/11/17 0608  BP: 112/62 (!) 122/54  Pulse: 80 80  Resp: 18 18  Temp: 98.6 F (37 C) 97.7 F (36.5 C)  SpO2: 99% 92%   Vitals:   03/10/17 0840 03/10/17 1724 03/10/17 2205 03/11/17 0608  BP:  122/71 112/62 (!) 122/54  Pulse:  93 80 80  Resp:  16 18 18   Temp:  98.4 F (36.9 C) 98.6 F (37 C) 97.7 F (36.5 C)  TempSrc:  Oral Oral Oral  SpO2: 90%  99% 92%  Weight:      Height:        General: Not in pain or dyspnea Neurology: Awake and alert, non focal  E ENT: no pallor, no icterus, oral mucosa moist Cardiovascular: No JVD. S1-S2 present, rhythmic, no gallops, rubs, or murmurs. No lower extremity edema. Pulmonary: decreased breath sounds bilaterally, adequate air movement, no wheezing, rhonchi.Poor air movement with scattered rales.  Gastrointestinal. Abdomen protuberant, no organomegaly, non tender, no rebound or guarding Skin. No rashes Musculoskeletal: no joint deformities   The results of significant diagnostics from this hospitalization (including imaging, microbiology, ancillary and laboratory) are listed below for  reference.     Microbiology: Recent Results (from the past 240 hour(s))  Blood culture (routine x 2)     Status: None (Preliminary result)   Collection Time: 03/07/17  8:15 PM  Result Value Ref Range Status   Specimen Description BLOOD RIGHT HAND  Final   Special Requests   Final    BOTTLES DRAWN AEROBIC AND ANAEROBIC Blood Culture adequate volume   Culture NO GROWTH 3 DAYS  Final   Report Status PENDING  Incomplete  Blood culture (routine x 2)     Status: None (Preliminary result)   Collection Time: 03/07/17  8:15 PM  Result Value Ref Range Status   Specimen Description BLOOD LEFT ANTECUBITAL  Final   Special Requests   Final    BOTTLES DRAWN AEROBIC AND ANAEROBIC Blood Culture adequate volume   Culture NO GROWTH 3 DAYS  Final   Report Status PENDING  Incomplete  Labs: BNP (last 3 results) Recent Labs    12/09/16 0041  BNP 63.7   Basic Metabolic Panel: Recent Labs  Lab 03/07/17 1333 03/08/17 0403  NA 138 136  K 3.4* 3.7  CL 97* 98*  CO2 27 27  GLUCOSE 89 175*  BUN <5* 5*  CREATININE 0.37* 0.51  CALCIUM 7.6* 8.2*   Liver Function Tests: Recent Labs  Lab 03/07/17 1333  AST 51*  ALT 21  ALKPHOS 157*  BILITOT 0.7  PROT 6.2*  ALBUMIN 3.2*   Recent Labs  Lab 03/07/17 1840  LIPASE 29   Recent Labs  Lab 03/07/17 1333 03/08/17 1316  AMMONIA 44* 52*   CBC: Recent Labs  Lab 03/07/17 1333 03/08/17 0403  WBC 5.0 4.6  NEUTROABS 3.3 4.2  HGB 10.5* 10.8*  HCT 34.3* 36.0  MCV 88.6 89.3  PLT 167 162   Cardiac Enzymes: No results for input(s): CKTOTAL, CKMB, CKMBINDEX, TROPONINI in the last 168 hours. BNP: Invalid input(s): POCBNP CBG: No results for input(s): GLUCAP in the last 168 hours. D-Dimer No results for input(s): DDIMER in the last 72 hours. Hgb A1c No results for input(s): HGBA1C in the last 72 hours. Lipid Profile No results for input(s): CHOL, HDL, LDLCALC, TRIG, CHOLHDL, LDLDIRECT in the last 72 hours. Thyroid function  studies No results for input(s): TSH, T4TOTAL, T3FREE, THYROIDAB in the last 72 hours.  Invalid input(s): FREET3 Anemia work up No results for input(s): VITAMINB12, FOLATE, FERRITIN, TIBC, IRON, RETICCTPCT in the last 72 hours. Urinalysis    Component Value Date/Time   COLORURINE STRAW (A) 12/29/2016 2130   APPEARANCEUR CLEAR 12/29/2016 2130   LABSPEC 1.005 12/29/2016 2130   PHURINE 7.0 12/29/2016 2130   GLUCOSEU NEGATIVE 12/29/2016 2130   HGBUR NEGATIVE 12/29/2016 2130   BILIRUBINUR NEGATIVE 12/29/2016 2130   BILIRUBINUR moderate (A) 06/19/2015 1906   KETONESUR NEGATIVE 12/29/2016 2130   PROTEINUR NEGATIVE 12/29/2016 2130   UROBILINOGEN >=8.0 06/19/2015 1906   UROBILINOGEN 0.2 09/14/2011 2141   NITRITE NEGATIVE 12/29/2016 2130   LEUKOCYTESUR NEGATIVE 12/29/2016 2130   Sepsis Labs Invalid input(s): PROCALCITONIN,  WBC,  LACTICIDVEN Microbiology Recent Results (from the past 240 hour(s))  Blood culture (routine x 2)     Status: None (Preliminary result)   Collection Time: 03/07/17  8:15 PM  Result Value Ref Range Status   Specimen Description BLOOD RIGHT HAND  Final   Special Requests   Final    BOTTLES DRAWN AEROBIC AND ANAEROBIC Blood Culture adequate volume   Culture NO GROWTH 3 DAYS  Final   Report Status PENDING  Incomplete  Blood culture (routine x 2)     Status: None (Preliminary result)   Collection Time: 03/07/17  8:15 PM  Result Value Ref Range Status   Specimen Description BLOOD LEFT ANTECUBITAL  Final   Special Requests   Final    BOTTLES DRAWN AEROBIC AND ANAEROBIC Blood Culture adequate volume   Culture NO GROWTH 3 DAYS  Final   Report Status PENDING  Incomplete     Time coordinating discharge: 45 minutes  SIGNED:   Coralie Keens, MD  [facility] 03/11/2017, 1:53 PM Pager [phone]  If 7PM-7AM, please contact night-coverage [email or web address] Password [email or web address]

## 2017-03-11 NOTE — Progress Notes (Signed)
Patient called this nurse asking for her sleep medicine.  Reviewed patient med list.  Informed the patient there is nothing ordered for sleep and would be more than happy to page the on call NP.  Patient stated she was given something last night.  Reviewed the PRN orders from last night and informed the patient she was given Ativan and Benadryl.  Explained to the patient the Ativan was discontinued and was only given based on symptoms of alcohol withdrawal.  Offered to still give the patient Benadryl due to the fact the patient was  scratching arms.  Patient refused the Benadryl and this nurse paged the on NP [place].   Patient called this nurse back into the room a few minutes later asking for the sleep med.  Informed the patient there was no response and I would page the NP again. Advised the patient to reduce the tv volume, dim the lights and get comfortable in bed to relax but patient stated she fine.  Also offered the Benadryl again but pt refused. Pt became upset due to the lack of response for the sleep med.  Explained to patient I can only carry out orders the MD/NP orders and things within my scope of practice. Furthermore, explained I will continue to follow up on her request and notify as needed.

## 2017-03-11 NOTE — Progress Notes (Signed)
Patient stated she does not have a ride home and can't pay for a taxi. CSW provided taxi voucher, approved by CSW AD.  CSW signing off.  Osborne Casco Tawania Daponte LCSW [phone]

## 2017-03-11 NOTE — Progress Notes (Signed)
Pt has been hospitalized for pneumonia and is requesting that someone from our group look at her wound. Pt is S/PLaparotomy, partial omentectomy, primary umbilical hernia repair with sutures, on 11/22-19 by Dr. Dwain Sarna  Pt states no drainage from wound and no increased pain. Pt states besides her pneumonia she is doing well. Pt is being discharged soon.  PE: General: well appeaing, NAD Abd: midline wound appears well healing without surrounding erythema or drainage. Still a small central open area remains.    I instructed the pt to continue wet to dry dressings to keep the area moist and to promote healing.   Mattie Marlin, [facility] Surgery Pager [phone]

## 2017-03-11 NOTE — Progress Notes (Signed)
PT Cancellation Note  Patient Details Name: LYNDSY GILBERTO MRN: [id number] DOB: 1960-07-03   Cancelled Treatment:    Reason Eval/Treat Not Completed: Patient declined, no reason specified Patient declines PT today, stating "its not a good time". Reports she is going home later today.    Nedra Hai PT, DPT, CBIS  Supplemental Physical Therapist [facility]   Pager [phone]

## 2017-03-11 NOTE — Progress Notes (Addendum)
Called taxi to pick pt up. The taxi driver wants pt to be at the main entrance in 10 minutes, nurse was in the room to inform pt.,  But she refused to leave, stasted she wanted to stay another night. Charge nurse was notified, and since patient was medically cleared to leave, charge called security to escort pt out. [facility] was [facility] [place] in [facility]. Nurse stayed with nurse until taxi came to pick pt. Pt. 02 level was checked without 02 before leaving,her 02 was 91, which was the expected level according to the doctor.

## 2017-03-11 NOTE — Progress Notes (Cosign Needed)
PROGRESS NOTE  RACHELL DRUCKENMILLER WUJ:[id number] DOB: [birth year] DOA: 03/07/2017 PCP: Patient, No Pcp Per   LOS: 3 days   Brief Narrative / Interim history: 57 y.o. Female with history of bipolar disorder, tobacco abuse, chronic alcohol abuse, cirrhosis of the liver, multiple sclerosis, ADHD, polysubstance abuse, presented to the ED with nausea, vomiting, cough and shortness of breath that had been worsening over 1 week prior. She was treated with vanc/zosyn. Throughout ED stay she was afebrile and denied chest pain. Upon being discharged patient became short of breath and O2 was in the 80's with wheezing. O2 was started. CXR revealed chronic changes and coarse linear densities left lung base. Patient is currently a smoker (1-2ppd x 40 years) and drinks 4 glasses of wine daily. Patient does not have O2 at home, but has been told she needs it. Antibiotics were changed to Rocephin and azithromycin. Blood and sputum cultures were negative.  She was admitted for Acute Hypoxia respiratory failure due to PNA  Patient is requesting discharge to alcohol rehab  Assessment & Plan: Principal Problem:   Acute respiratory failure (HCC) Active Problems:   Noncompliance with therapeutic plan   Stimulant abuse (HCC)   Alcoholic cirrhosis of liver with ascites (HCC)   Bipolar I disorder (HCC)   Alcohol abuse with alcohol-induced mood disorder (HCC)   Non-intractable vomiting with nausea   Hypoxia   CAP Resolved. In ED was being treated for PNA with vanc/zosyn.  CXR did not show clear infiltrates. No fever or white count.   Acute Hypoxia respiratory failure/COPD acute exacerbation In ED before discharging patient was found to have oxygen in low 80's and was admitted.  Treating with Azythromycin/Ceftriaxone   Today patient has no coughing sputum production, afebrile.  Patient does not have at home O2. Oxygen levels stable 92% on 2L. Walking w/o oxygen stable at 90%   Continue oral prednisone  Nebulizer  as needed  Tobacco abuse History of 1 ppd x 40 years.  Continue nicotine patch Smoking cessation  Alcohol abuse Patient reports drinking 4 glasses of wine for 2-3 years daily.  CIWA today 1. Still with severe HA (8/10), Nausea, minor trembling Continue thiamine, folic acid Patient is requesting alcohol rehab  GERD/diffuse abdominal pain/N[place] Patient reports occasional abdominal pain after eating  Continue Protonix and zofran PRN  Alcoholic Cisshosis of liver with ascites Ammonia level 52.  Distention due to ascities Continue home meds   DVT prophylaxis: SCD Code Status: FULL Family Communication: none Disposition Plan: home   Consultants:   none  Procedures:   none    Antimicrobials:  Azithromycin/cephtriaxone  Subjective: Patient alert laying in bed. She says she has a headache (8/10) with pain in her mandible from TMJ. No coughing, sob, chest pain, fevers. Alcohol withdrawal symptoms improving  Objective: Vitals:   03/10/17 0840 03/10/17 1724 03/10/17 2205 03/11/17 0608  BP:  122/71 112/62 (!) 122/54  Pulse:  93 80 80  Resp:  16 18 18   Temp:  98.4 F (36.9 C) 98.6 F (37 C) 97.7 F (36.5 C)  TempSrc:  Oral Oral Oral  SpO2: 90%  99% 92%  Weight:      Height:        Intake/Output Summary (Last 24 hours) at 03/11/2017 1013 Last data filed at 03/11/2017 0606 Gross per 24 hour  Intake -  Output 3100 ml  Net -3100 ml   Filed Weights   03/07/17 1900 03/08/17 0633  Weight: 80.3 kg (177 lb 0.5 oz) 76.5 kg (  168 lb 10.4 oz)    Examination:  Constitutional: NAD Eyes: PERRL, lids and conjunctivae normal ENMT: Mucous membranes are moist. No oropharyngeal exudates Neck: normal, supple Respiratory: distant breath sounds, no wheezing, no crackles. Normal respiratory effort. No accessory muscle use.  Cardiovascular: Regular rate and rhythm, no murmurs / rubs / gallops.mild leg edema. 2+ pedal pulses. Abdomen: difure tenderness. Distention, +fluid wave.  Bowel sounds positive.  Musculoskeletal: No joint deformity upper and lower extremities. No contractures. Normal muscle tone.  Skin: no rashes, lesions, ulcers. No induration Neurologic: CN 2-12 grossly intact. Strength 5/5 in all 4.  Psychiatric: Some confusion answering questions. Normal judgment. Alert and oriented x 3. Normal mood.    Data Reviewed: I have independently reviewed following labs and imaging studies   CBC: Recent Labs  Lab 03/07/17 1333 03/08/17 0403  WBC 5.0 4.6  NEUTROABS 3.3 4.2  HGB 10.5* 10.8*  HCT 34.3* 36.0  MCV 88.6 89.3  PLT 167 162   Basic Metabolic Panel: Recent Labs  Lab 03/07/17 1333 03/08/17 0403  NA 138 136  K 3.4* 3.7  CL 97* 98*  CO2 27 27  GLUCOSE 89 175*  BUN <5* 5*  CREATININE 0.37* 0.51  CALCIUM 7.6* 8.2*   GFR: Estimated Creatinine Clearance: 74.3 mL/min (by C-G formula based on SCr of 0.51 mg/dL). Liver Function Tests: Recent Labs  Lab 03/07/17 1333  AST 51*  ALT 21  ALKPHOS 157*  BILITOT 0.7  PROT 6.2*  ALBUMIN 3.2*   Recent Labs  Lab 03/07/17 1840  LIPASE 29   Recent Labs  Lab 03/07/17 1333 03/08/17 1316  AMMONIA 44* 52*   Coagulation Profile: Recent Labs  Lab 03/07/17 1333  INR 1.18   Cardiac Enzymes: No results for input(s): CKTOTAL, CKMB, CKMBINDEX, TROPONINI in the last 168 hours. BNP (last 3 results) No results for input(s): PROBNP in the last 8760 hours. HbA1C: No results for input(s): HGBA1C in the last 72 hours. CBG: No results for input(s): GLUCAP in the last 168 hours. Lipid Profile: No results for input(s): CHOL, HDL, LDLCALC, TRIG, CHOLHDL, LDLDIRECT in the last 72 hours. Thyroid Function Tests: No results for input(s): TSH, T4TOTAL, FREET4, T3FREE, THYROIDAB in the last 72 hours. Anemia Panel: No results for input(s): VITAMINB12, FOLATE, FERRITIN, TIBC, IRON, RETICCTPCT in the last 72 hours. Urine analysis:    Component Value Date/Time   COLORURINE STRAW (A) 12/29/2016 2130    APPEARANCEUR CLEAR 12/29/2016 2130   LABSPEC 1.005 12/29/2016 2130   PHURINE 7.0 12/29/2016 2130   GLUCOSEU NEGATIVE 12/29/2016 2130   HGBUR NEGATIVE 12/29/2016 2130   BILIRUBINUR NEGATIVE 12/29/2016 2130   BILIRUBINUR moderate (A) 06/19/2015 1906   KETONESUR NEGATIVE 12/29/2016 2130   PROTEINUR NEGATIVE 12/29/2016 2130   UROBILINOGEN >=8.0 06/19/2015 1906   UROBILINOGEN 0.2 09/14/2011 2141   NITRITE NEGATIVE 12/29/2016 2130   LEUKOCYTESUR NEGATIVE 12/29/2016 2130   Sepsis Labs: Invalid input(s): PROCALCITONIN, LACTICIDVEN  Recent Results (from the past 240 hour(s))  Blood culture (routine x 2)     Status: None (Preliminary result)   Collection Time: 03/07/17  8:15 PM  Result Value Ref Range Status   Specimen Description BLOOD RIGHT HAND  Final   Special Requests   Final    BOTTLES DRAWN AEROBIC AND ANAEROBIC Blood Culture adequate volume   Culture NO GROWTH 3 DAYS  Final   Report Status PENDING  Incomplete  Blood culture (routine x 2)     Status: None (Preliminary result)   Collection Time: 03/07/17  8:15 PM  Result Value Ref Range Status   Specimen Description BLOOD LEFT ANTECUBITAL  Final   Special Requests   Final    BOTTLES DRAWN AEROBIC AND ANAEROBIC Blood Culture adequate volume   Culture NO GROWTH 3 DAYS  Final   Report Status PENDING  Incomplete      Radiology Studies: No results found.   Scheduled Meds: . azithromycin  500 mg Oral QHS  . folic acid  1 mg Oral Daily  . furosemide  40 mg Oral Daily  . multivitamin with minerals  1 tablet Oral Daily  . nicotine  14 mg Transdermal Daily  . pantoprazole  40 mg Oral Daily  . pneumococcal 23 valent vaccine  0.5 mL Intramuscular Tomorrow-1000  . predniSONE  40 mg Oral Q breakfast  . rifaximin  550 mg Oral BID  . risperiDONE  0.5 mg Oral BID  . sodium chloride flush  3 mL Intravenous Q12H  . spironolactone  100 mg Oral Daily  . thiamine  100 mg Oral Daily   Continuous Infusions: . sodium chloride    .  cefTRIAXone (ROCEPHIN)  IV Stopped (03/10/17 2239)       Time spent:     Libia Fazzini Fransico Michael, PA-S

## 2017-03-11 NOTE — NC FL2 (Signed)
[place] MEDICAID FL2 LEVEL OF CARE SCREENING TOOL     IDENTIFICATION  Patient Name: Anne Black Birthdate: 1960/02/27 Sex: female Admission Date (Current Location): 03/07/2017  [facility] and [place] Number:  Producer, television/film/video and Address:  [facility] [place], [address], [place], [place] [address]      Provider Number: [id number]  Attending Physician Name and Address:  Coralie Keens  Relative Name and Phone Number:  Delray Alt, mother, [phone]    Current Level of Care: Hospital Recommended Level of Care: Skilled Nursing Facility Prior Approval Number:    Date Approved/Denied:   PASRR Number:    Discharge Plan: SNF    Current Diagnoses: Patient Active Problem List   Diagnosis Date Noted  . Abdominal wall cellulitis 12/30/2016  . S/P exploratory laparotomy 12/30/2016  . Distal radius fracture, left 09/09/2016  . Thrombocytopenia (HCC) 09/09/2016  . Chest x-ray abnormality   . Umbilical hernia without obstruction and without gangrene 05/12/2016  . Itching 02/24/2016  . Ventral hernia without obstruction or gangrene 02/24/2016  . Palliative care encounter   . Ascites   . Tachypnea   . Typical atrial flutter (HCC)   . Hypomagnesemia   . Hypoxia   . Dehydration 01/22/2016  . Low back ache 12/30/2015  . Non-intractable vomiting with nausea   . Alcohol abuse with alcohol-induced mood disorder (HCC) 12/15/2015  . Acute alcoholism (HCC)   . Abdominal distension 11/04/2015  . Alcohol abuse   . Dyspnea   . Acute respiratory failure (HCC) 10/15/2015  . Ascites due to alcoholic cirrhosis (HCC) 10/15/2015  . Osteoporosis   . Bipolar I disorder (HCC)   . Multiple sclerosis (HCC)   . Alcoholic cirrhosis of liver with ascites (HCC) 08/22/2015  . Bipolar disorder, current episode mixed, moderate (HCC) 08/01/2015  . Alcohol use disorder, severe, dependence (HCC) 07/31/2015  . Macrocytic anemia- due to alcohol abuse with normal B12 &  folate levels 05/31/2015  . Severe protein-calorie malnutrition (HCC) 05/31/2015  . Hypokalemia 05/26/2015  . Encephalopathy, hepatic (HCC) 05/26/2015  . Alcohol withdrawal (HCC) 05/18/2015  . Abdominal pain 05/18/2015  . Anxiety 05/18/2015  . UTI (lower urinary tract infection) 05/18/2015  . Stimulant abuse (HCC) 12/27/2014  . Nicotine abuse 12/27/2014  . Hyperprolactinemia (HCC) 11/15/2014  . Dyslipidemia   . Tobacco abuse 01/23/2014  . Noncompliance with therapeutic plan 04/04/2013  . Unspecified hereditary and idiopathic peripheral neuropathy 05/01/2012  . GERD (gastroesophageal reflux disease) 05/01/2012  . Osteoarthrosis, unspecified whether generalized or localized, involving lower leg 05/01/2012  . Hyponatremia 07/01/2011    Orientation RESPIRATION BLADDER Height & Weight     Self, Time, Situation, Place  O2(Nasal cannula 2L) Continent Weight: 76.5 kg (168 lb 10.4 oz) Height:  5\' 2"  (157.5 cm)  BEHAVIORAL SYMPTOMS/MOOD NEUROLOGICAL BOWEL NUTRITION STATUS      Continent Diet(Please see DC Summary)  AMBULATORY STATUS COMMUNICATION OF NEEDS Skin   Independent Verbally Surgical wounds                       Personal Care Assistance Level of Assistance  Bathing, Feeding, Dressing Bathing Assistance: Independent Feeding assistance: Independent Dressing Assistance: Independent     Functional Limitations Info             SPECIAL CARE FACTORS FREQUENCY  PT (By licensed PT)     PT Frequency: 2x/week              Contractures  Additional Factors Info  Code Status, Allergies Code Status Info: Full Allergies Info: NKA           Current Medications (03/11/2017):  This is the current hospital active medication list Current Facility-Administered Medications  Medication Dose Route Frequency Provider Last Rate Last Dose  . 0.9 %  sodium chloride infusion  250 mL Intravenous PRN Haydee Monica, MD      . acetaminophen (TYLENOL) tablet 650 mg  650 mg  Oral Q6H PRN Marlin Canary U, DO   650 mg at 03/11/17 1046  . azithromycin (ZITHROMAX) tablet 500 mg  500 mg Oral QHS Vann, Jessica U, DO   500 mg at 03/10/17 2209  . camphor-menthol (SARNA) lotion   Topical PRN Marlin Canary U, DO      . cefTRIAXone (ROCEPHIN) 1 g in dextrose 5 % 50 mL IVPB  1 g Intravenous Q24H Haydee Monica, MD   Stopped at 03/10/17 2239  . diphenhydrAMINE (BENADRYL) capsule 25 mg  25 mg Oral Q4H PRN Schorr, Roma Kayser, NP   25 mg at 03/11/17 0103  . folic acid (FOLVITE) tablet 1 mg  1 mg Oral Daily Tarry Kos A, MD   1 mg at 03/11/17 1045  . furosemide (LASIX) tablet 40 mg  40 mg Oral Daily Tarry Kos A, MD   40 mg at 03/11/17 1045  . ipratropium-albuterol (DUONEB) 0.5-2.5 (3) MG/3ML nebulizer solution 3 mL  3 mL Nebulization Q6H PRN Marlin Canary U, DO      . multivitamin with minerals tablet 1 tablet  1 tablet Oral Daily Haydee Monica, MD   1 tablet at 03/11/17 1045  . nicotine (NICODERM CQ - dosed in mg/24 hours) patch 14 mg  14 mg Transdermal Daily Vann, Jessica U, DO   14 mg at 03/11/17 1052  . ondansetron (ZOFRAN) tablet 4 mg  4 mg Oral Q8H PRN Haydee Monica, MD   4 mg at 03/10/17 1248  . pantoprazole (PROTONIX) EC tablet 40 mg  40 mg Oral Daily Tarry Kos A, MD   40 mg at 03/11/17 1045  . pneumococcal 23 valent vaccine (PNU-IMMUNE) injection 0.5 mL  0.5 mL Intramuscular Tomorrow-1000 Vann, Jessica U, DO      . predniSONE (DELTASONE) tablet 40 mg  40 mg Oral Q breakfast Vann, Jessica U, DO   40 mg at 03/11/17 1044  . rifaximin (XIFAXAN) tablet 550 mg  550 mg Oral BID Tarry Kos A, MD   550 mg at 03/11/17 1045  . risperiDONE (RISPERDAL) tablet 0.5 mg  0.5 mg Oral BID Marlin Canary U, DO   0.5 mg at 03/11/17 1045  . sodium chloride flush (NS) 0.9 % injection 3 mL  3 mL Intravenous Q12H Tarry Kos A, MD   3 mL at 03/10/17 2209  . sodium chloride flush (NS) 0.9 % injection 3 mL  3 mL Intravenous PRN Tarry Kos A, MD      . spironolactone (ALDACTONE)  tablet 100 mg  100 mg Oral Daily Tarry Kos A, MD   100 mg at 03/11/17 1044  . thiamine (VITAMIN B-1) tablet 100 mg  100 mg Oral Daily Tarry Kos A, MD   100 mg at 03/11/17 1045  . traMADol (ULTRAM) tablet 50 mg  50 mg Oral Q6H PRN Schorr, Roma Kayser, NP   50 mg at 03/10/17 2008     Discharge Medications: Please see discharge summary for a list of discharge medications.  Relevant Imaging Results:  Relevant Lab Results:  Additional Information SS#: [id number]  Mearl Latin, LCSWA

## 2017-03-12 ENCOUNTER — Other Ambulatory Visit: Payer: Self-pay | Admitting: Family Medicine

## 2017-03-12 ENCOUNTER — Telehealth: Payer: Self-pay | Admitting: Family Medicine

## 2017-03-12 LAB — CULTURE, BLOOD (ROUTINE X 2)
CULTURE: NO GROWTH
CULTURE: NO GROWTH
Special Requests: ADEQUATE
Special Requests: ADEQUATE

## 2017-03-12 NOTE — Telephone Encounter (Signed)
error 

## 2017-03-12 NOTE — Telephone Encounter (Signed)
Call from answering service about zofran. Sent in refill to [facility] in [place]

## 2017-03-14 ENCOUNTER — Ambulatory Visit: Payer: Self-pay | Admitting: Family Medicine

## 2017-03-14 ENCOUNTER — Telehealth: Payer: Self-pay

## 2017-03-14 ENCOUNTER — Inpatient Hospital Stay: Payer: 59 | Admitting: Family Medicine

## 2017-03-14 ENCOUNTER — Encounter (HOSPITAL_COMMUNITY): Payer: Self-pay | Admitting: Emergency Medicine

## 2017-03-14 ENCOUNTER — Emergency Department (HOSPITAL_COMMUNITY)
Admission: EM | Admit: 2017-03-14 | Discharge: 2017-03-15 | Disposition: A | Discharge status: Home / Self Care | Payer: 59 | Attending: Emergency Medicine | Admitting: Emergency Medicine

## 2017-03-14 ENCOUNTER — Emergency Department (HOSPITAL_COMMUNITY): Payer: 59

## 2017-03-14 DIAGNOSIS — Z7984 Long term (current) use of oral hypoglycemic drugs: Secondary | ICD-10-CM | POA: Insufficient documentation

## 2017-03-14 DIAGNOSIS — F17290 Nicotine dependence, other tobacco product, uncomplicated: Secondary | ICD-10-CM | POA: Diagnosis not present

## 2017-03-14 DIAGNOSIS — F1010 Alcohol abuse, uncomplicated: Secondary | ICD-10-CM

## 2017-03-14 DIAGNOSIS — Z7982 Long term (current) use of aspirin: Secondary | ICD-10-CM | POA: Insufficient documentation

## 2017-03-14 DIAGNOSIS — F1020 Alcohol dependence, uncomplicated: Secondary | ICD-10-CM | POA: Diagnosis not present

## 2017-03-14 DIAGNOSIS — Z008 Encounter for other general examination: Secondary | ICD-10-CM

## 2017-03-14 DIAGNOSIS — Z79899 Other long term (current) drug therapy: Secondary | ICD-10-CM | POA: Diagnosis not present

## 2017-03-14 DIAGNOSIS — Z046 Encounter for general psychiatric examination, requested by authority: Secondary | ICD-10-CM | POA: Insufficient documentation

## 2017-03-14 DIAGNOSIS — F17210 Nicotine dependence, cigarettes, uncomplicated: Secondary | ICD-10-CM | POA: Insufficient documentation

## 2017-03-14 DIAGNOSIS — Y900 Blood alcohol level of less than 20 mg/100 ml: Secondary | ICD-10-CM | POA: Diagnosis not present

## 2017-03-14 LAB — CBC WITH DIFFERENTIAL/PLATELET
BASOS ABS: 0.1 10*3/uL (ref 0.0–0.1)
Basophils Relative: 1 %
EOS PCT: 2 %
Eosinophils Absolute: 0.2 10*3/uL (ref 0.0–0.7)
HCT: 38.2 % (ref 36.0–46.0)
Hemoglobin: 11.9 g/dL — ABNORMAL LOW (ref 12.0–15.0)
LYMPHS ABS: 0.8 10*3/uL (ref 0.7–4.0)
LYMPHS PCT: 9 %
MCH: 26.8 pg (ref 26.0–34.0)
MCHC: 31.2 g/dL (ref 30.0–36.0)
MCV: 86 fL (ref 78.0–100.0)
Monocytes Absolute: 0.8 10*3/uL (ref 0.1–1.0)
Monocytes Relative: 9 %
Neutro Abs: 6.6 10*3/uL (ref 1.7–7.7)
Neutrophils Relative %: 79 %
Platelets: 161 10*3/uL (ref 150–400)
RBC: 4.44 MIL/uL (ref 3.87–5.11)
RDW: 15.7 % — ABNORMAL HIGH (ref 11.5–15.5)
WBC: 8.3 10*3/uL (ref 4.0–10.5)

## 2017-03-14 LAB — COMPREHENSIVE METABOLIC PANEL
ALBUMIN: 4.1 g/dL (ref 3.5–5.0)
ALT: 30 U/L (ref 14–54)
ANION GAP: 12 (ref 5–15)
AST: 45 U/L — ABNORMAL HIGH (ref 15–41)
Alkaline Phosphatase: 152 U/L — ABNORMAL HIGH (ref 38–126)
BILIRUBIN TOTAL: 0.6 mg/dL (ref 0.3–1.2)
BUN: 5 mg/dL — ABNORMAL LOW (ref 6–20)
CO2: 28 mmol/L (ref 22–32)
Calcium: 8.7 mg/dL — ABNORMAL LOW (ref 8.9–10.3)
Chloride: 97 mmol/L — ABNORMAL LOW (ref 101–111)
Creatinine, Ser: 0.35 mg/dL — ABNORMAL LOW (ref 0.44–1.00)
GFR calc Af Amer: >60 mL/min (ref >60)
GFR calc non Af Amer: >60 mL/min (ref >60)
GLUCOSE: 88 mg/dL (ref 65–99)
Potassium: 3.4 mmol/L — ABNORMAL LOW (ref 3.5–5.1)
SODIUM: 137 mmol/L (ref 135–145)
TOTAL PROTEIN: 7.5 g/dL (ref 6.5–8.1)

## 2017-03-14 LAB — URINALYSIS, ROUTINE W REFLEX MICROSCOPIC
BILIRUBIN URINE: NEGATIVE
Glucose, UA: NEGATIVE mg/dL
Ketones, ur: NEGATIVE mg/dL
NITRITE: NEGATIVE
PROTEIN: NEGATIVE mg/dL
SPECIFIC GRAVITY, URINE: 1.006 (ref 1.005–1.030)
pH: 8 (ref 5.0–8.0)

## 2017-03-14 LAB — I-STAT BETA HCG BLOOD, ED (MC, WL, AP ONLY)

## 2017-03-14 LAB — RAPID URINE DRUG SCREEN, HOSP PERFORMED
Amphetamines: NOT DETECTED
BARBITURATES: NOT DETECTED
BENZODIAZEPINES: NOT DETECTED
COCAINE: NOT DETECTED
Opiates: NOT DETECTED
Tetrahydrocannabinol: NOT DETECTED

## 2017-03-14 LAB — ETHANOL: Alcohol, Ethyl (B): 13 mg/dL — ABNORMAL HIGH (ref <10)

## 2017-03-14 MED ORDER — GABAPENTIN 300 MG PO CAPS
300.0000 mg | ORAL_CAPSULE | Freq: Three times a day (TID) | ORAL | Status: DC
  Administered 2017-03-14 – 2017-03-15 (×2): 300 mg via ORAL
  Filled 2017-03-14 (×2): qty 1

## 2017-03-14 MED ORDER — LORAZEPAM 1 MG PO TABS
2.0000 mg | ORAL_TABLET | Freq: Once | ORAL | Status: AC
  Administered 2017-03-14: 2 mg via ORAL
  Filled 2017-03-14: qty 2

## 2017-03-14 MED ORDER — LISINOPRIL 10 MG PO TABS: 10.0000 mg | ORAL_TABLET | Freq: Every day | ORAL | Status: DC

## 2017-03-14 MED ORDER — ASPIRIN EC 81 MG PO TBEC: 81.0000 mg | DELAYED_RELEASE_TABLET | Freq: Every day | ORAL | Status: DC

## 2017-03-14 MED ORDER — PANTOPRAZOLE SODIUM 40 MG PO TBEC
40.0000 mg | DELAYED_RELEASE_TABLET | Freq: Once | ORAL | Status: AC
  Administered 2017-03-14: 40 mg via ORAL
  Filled 2017-03-14: qty 1

## 2017-03-14 MED ORDER — LORAZEPAM 1 MG PO TABS: 0.0000 mg | ORAL_TABLET | Freq: Four times a day (QID) | ORAL | Status: DC

## 2017-03-14 MED ORDER — LORAZEPAM 2 MG/ML IJ SOLN: 0.0000 mg | Freq: Two times a day (BID) | INTRAMUSCULAR | Status: DC

## 2017-03-14 MED ORDER — FLUTICASONE FUROATE-VILANTEROL 100-25 MCG/INH IN AEPB
1.0000 | INHALATION_SPRAY | Freq: Every day | RESPIRATORY_TRACT | Status: DC
  Administered 2017-03-15: 1 via RESPIRATORY_TRACT
  Filled 2017-03-14: qty 28

## 2017-03-14 MED ORDER — ACETAMINOPHEN 500 MG PO TABS
500.0000 mg | ORAL_TABLET | Freq: Four times a day (QID) | ORAL | Status: DC | PRN
  Administered 2017-03-15: 500 mg via ORAL
  Filled 2017-03-14: qty 1

## 2017-03-14 MED ORDER — HYDROCORTISONE 1 % EX CREA: 1.0000 "application " | TOPICAL_CREAM | Freq: Three times a day (TID) | CUTANEOUS | Status: DC | PRN

## 2017-03-14 MED ORDER — VITAMIN B-1 100 MG PO TABS
100.0000 mg | ORAL_TABLET | Freq: Every day | ORAL | Status: DC
  Administered 2017-03-15: 100 mg via ORAL
  Filled 2017-03-14: qty 1

## 2017-03-14 MED ORDER — THIAMINE HCL 100 MG/ML IJ SOLN
100.0000 mg | Freq: Every day | INTRAMUSCULAR | Status: DC
  Filled 2017-03-14: qty 2

## 2017-03-14 MED ORDER — MELATONIN 3 MG PO TABS: 3.0000 mg | ORAL_TABLET | Freq: Every day | ORAL | Status: DC

## 2017-03-14 MED ORDER — FLUTICASONE PROPIONATE 50 MCG/ACT NA SUSP: 1.0000 | NASAL | Status: DC | PRN

## 2017-03-14 MED ORDER — ONDANSETRON HCL 4 MG PO TABS: 4.0000 mg | ORAL_TABLET | Freq: Three times a day (TID) | ORAL | Status: DC | PRN

## 2017-03-14 MED ORDER — NICOTINE 21 MG/24HR TD PT24
21.0000 mg | MEDICATED_PATCH | Freq: Every day | TRANSDERMAL | Status: DC
  Administered 2017-03-15: 21 mg via TRANSDERMAL
  Filled 2017-03-14: qty 1

## 2017-03-14 MED ORDER — FUROSEMIDE 40 MG PO TABS
40.0000 mg | ORAL_TABLET | Freq: Every day | ORAL | Status: DC
  Administered 2017-03-15: 40 mg via ORAL
  Filled 2017-03-14 (×2): qty 1

## 2017-03-14 MED ORDER — SERTRALINE HCL 50 MG PO TABS: 50.0000 mg | ORAL_TABLET | Freq: Every day | ORAL | Status: DC

## 2017-03-14 MED ORDER — FLUOXETINE HCL 20 MG PO CAPS
20.0000 mg | ORAL_CAPSULE | Freq: Every day | ORAL | Status: DC
  Administered 2017-03-14 – 2017-03-15 (×2): 20 mg via ORAL
  Filled 2017-03-14 (×2): qty 1

## 2017-03-14 MED ORDER — METFORMIN HCL 500 MG PO TABS
500.0000 mg | ORAL_TABLET | Freq: Every day | ORAL | Status: DC
  Administered 2017-03-15: 500 mg via ORAL
  Filled 2017-03-14 (×2): qty 1

## 2017-03-14 MED ORDER — AMLODIPINE BESYLATE 5 MG PO TABS
10.0000 mg | ORAL_TABLET | Freq: Every day | ORAL | Status: DC
  Filled 2017-03-14: qty 2

## 2017-03-14 MED ORDER — LORAZEPAM 2 MG/ML IJ SOLN: 0.0000 mg | Freq: Four times a day (QID) | INTRAMUSCULAR | Status: DC

## 2017-03-14 MED ORDER — ALBUTEROL SULFATE HFA 108 (90 BASE) MCG/ACT IN AERS
2.0000 | INHALATION_SPRAY | Freq: Four times a day (QID) | RESPIRATORY_TRACT | Status: DC | PRN
Start: 2017-03-14 — End: 2017-03-15
  Administered 2017-03-14: 2 via RESPIRATORY_TRACT
  Filled 2017-03-14: qty 6.7

## 2017-03-14 MED ORDER — RISPERIDONE 0.5 MG PO TABS
0.5000 mg | ORAL_TABLET | Freq: Two times a day (BID) | ORAL | Status: DC
  Administered 2017-03-14 – 2017-03-15 (×2): 0.5 mg via ORAL
  Filled 2017-03-14 (×2): qty 1

## 2017-03-14 MED ORDER — PANTOPRAZOLE SODIUM 40 MG PO TBEC
40.0000 mg | DELAYED_RELEASE_TABLET | Freq: Every day | ORAL | Status: DC
  Administered 2017-03-14 – 2017-03-15 (×2): 40 mg via ORAL
  Filled 2017-03-14 (×2): qty 1

## 2017-03-14 MED ORDER — TOPIRAMATE 100 MG PO TABS
100.0000 mg | ORAL_TABLET | Freq: Every day | ORAL | Status: DC
  Administered 2017-03-14 – 2017-03-15 (×2): 100 mg via ORAL
  Filled 2017-03-14 (×2): qty 1

## 2017-03-14 MED ORDER — BUSPIRONE HCL 10 MG PO TABS
15.0000 mg | ORAL_TABLET | Freq: Two times a day (BID) | ORAL | Status: DC
  Administered 2017-03-14 – 2017-03-15 (×2): 15 mg via ORAL
  Filled 2017-03-14 (×2): qty 2

## 2017-03-14 MED ORDER — ONDANSETRON 4 MG PO TBDP
4.0000 mg | ORAL_TABLET | Freq: Once | ORAL | Status: AC
  Administered 2017-03-14: 4 mg via ORAL
  Filled 2017-03-14: qty 1

## 2017-03-14 MED ORDER — LORAZEPAM 1 MG PO TABS: 0.0000 mg | ORAL_TABLET | Freq: Two times a day (BID) | ORAL | Status: DC

## 2017-03-14 NOTE — Telephone Encounter (Signed)
Transition Care Management Follow-up Telephone Call   Date discharged? 03/11/2017   How have you been since you were released from the hospital? Patient states that she feels terrible. She has been having migraine headaches with nausea but it has improved as of today. Patient takes Maxalt for the migraines.    Do you understand why you were in the hospital? yes   Do you understand the discharge instructions? yes   Where were you discharged to? home   Items Reviewed:  Medications reviewed: yes  Allergies reviewed: yes  Dietary changes reviewed: yes  Referrals reviewed: yes   Functional Questionnaire:   Activities of Daily Living (ADLs):   She states they are independent in the following: ambulation, bathing and hygiene, feeding, continence, grooming, toileting and dressing States they require assistance with the following: none   Any transportation issues/concerns?: no   Any patient concerns? Patient states that she needs an order discontinuing wound vac and supplies sent to her medical supply company.    Confirmed importance and date/time of follow-up visits scheduled yes  Provider Appointment booked with Dr. Creta Levin on 03/14/17 @ 2 pm.   Confirmed with patient if condition begins to worsen call PCP or go to the ER.  Patient was given the office number and encouraged to call back with question or concerns.  : yes

## 2017-03-14 NOTE — ED Triage Notes (Signed)
Per IVC paperwork-states patient is paranoid and having frequent mood swings-becomes aggressive at times

## 2017-03-14 NOTE — ED Notes (Signed)
Pt states she is unable to provide a urine specimen at this time.

## 2017-03-14 NOTE — ED Provider Notes (Signed)
[facility] DEPT Provider Note   CSN: [id number] Arrival date & time: 03/14/17  1651     History   Chief Complaint Chief Complaint  Patient presents with  . IVC    HPI Anne Black is a 57 y.o. female.  HPI   Patient is a 57 year old female presents to the ED after being IVC'ed by her family member who states that she was making threats of hurting family members as well as being volatile and having frequent mood swings.  Family states that she drinks in excess daily and believes that patient is danger to herself and others.  Upon entering the room, patient is dry heaving and stating that she needs to have her Zofran and Protonix, and she has some acid reflux. She appears to be doing this voluntarily as she is able to carry on a full conversation without dry heaves during history taking. States she is feeling anxious. States she has had no episodes of vomiting.   Pt tells me that she is in the hospital because her family IVC'ed her. She did not choose to come to the hospital on her own volition or because of a medical complaint. Pt had multiple somatic complaints, which upon further questioning were not consistent. She did not have any consistent/persistent complaints other than requesting zofran and protonix. Denies abdominal pain or urinary sxs. Denies any worsening of her breathing since being discharged from the hospital. Denies chest pain or fevers. Does report some pain from her hemorrhoids and a small amount of bright red blood on tissue, but denies any gross blood or tarry stools.  Pt denies any illicit drug use. States she was discharged from the hospital 3 days ago and has been drinking daily since she was discharged. Last drink was last night and she had 3-4 glasses of wine. Denies SI or HI. Denies hallucinations.   Past Medical History:  Diagnosis Date  . ADHD (attention deficit hyperactivity disorder)   . Anxiety   . Arthritis   . Ascites     . Bipolar disorder ([place])   . Cirrhosis ([place])   . Hypokalemia   . Migraine   . Multiple sclerosis ([place])   . Osteoporosis     Patient Active Problem List   Diagnosis Date Noted  . Abdominal wall cellulitis 12/30/2016  . S/P exploratory laparotomy 12/30/2016  . Distal radius fracture, left 09/09/2016  . Thrombocytopenia ([place]) 09/09/2016  . Chest x-ray abnormality   . Umbilical hernia without obstruction and without gangrene 05/12/2016  . Itching 02/24/2016  . Ventral hernia without obstruction or gangrene 02/24/2016  . Palliative care encounter   . Ascites   . Tachypnea   . Typical atrial flutter ([place])   . Hypomagnesemia   . Hypoxia   . Dehydration 01/22/2016  . Low back ache 12/30/2015  . Non-intractable vomiting with nausea   . Alcohol abuse with alcohol-induced mood disorder ([place]) 12/15/2015  . Acute alcoholism ([place])   . Abdominal distension 11/04/2015  . Alcohol abuse   . Dyspnea   . Acute respiratory failure ([place]) 10/15/2015  . Ascites due to alcoholic cirrhosis ([place]) 35/57/3220  . Osteoporosis   . Bipolar I disorder ([place])   . Multiple sclerosis ([place])   . Alcoholic cirrhosis of liver with ascites ([place]) 08/22/2015  . Bipolar disorder, current episode mixed, moderate ([place]) 08/01/2015  . Alcohol use disorder, severe, dependence ([place]) 07/31/2015  . Macrocytic anemia- due to alcohol abuse with normal B12 & folate levels 05/31/2015  .  Severe protein-calorie malnutrition ([place]) 05/31/2015  . Hypokalemia 05/26/2015  . Encephalopathy, hepatic ([place]) 05/26/2015  . Alcohol withdrawal ([place]) 05/18/2015  . Abdominal pain 05/18/2015  . Anxiety 05/18/2015  . UTI (lower urinary tract infection) 05/18/2015  . Stimulant abuse ([place]) 12/27/2014  . Nicotine abuse 12/27/2014  . Hyperprolactinemia ([place]) 11/15/2014  . Dyslipidemia   . Tobacco abuse 01/23/2014  . Noncompliance with therapeutic plan 04/04/2013  . Unspecified hereditary and idiopathic peripheral neuropathy 05/01/2012  .  GERD (gastroesophageal reflux disease) 05/01/2012  . Osteoarthrosis, unspecified whether generalized or localized, involving lower leg 05/01/2012  . Hyponatremia 07/01/2011    Past Surgical History:  Procedure Laterality Date  . ABDOMINAL WALL DEFECT REPAIR N/A 12/30/2016   Procedure: EXPLORATORY LAPAROTOMY WITH REPAIR ABDOMINAL WALL VENTRAL HERNIA;  Surgeon: Rolm Bookbinder, MD;  Location: [facility];  Service: General;  Laterality: N/A;  . APPLICATION OF WOUND VAC N/A 12/30/2016   Procedure: APPLICATION OF WOUND VAC;  Surgeon: Rolm Bookbinder, MD;  Location: [facility];  Service: General;  Laterality: N/A;  . CESAREAN SECTION  [phone]  . FRACTURE SURGERY    . HERNIA REPAIR    . MYRINGOTOMY WITH TUBE PLACEMENT Bilateral   . ORIF WRIST FRACTURE Left 09/09/2016   Procedure: OPEN REDUCTION INTERNAL FIXATION (ORIF) LEFT WRIST FRACTURE, LEFT CARPAL TUNNEL RELEASE;  Surgeon: Roseanne Kaufman, MD;  Location: [facility];  Service: Orthopedics;  Laterality: Left;  . TONSILLECTOMY      OB History    No data available       Home Medications    Prior to Admission medications   Medication Sig Start Date End Date Taking? Authorizing Provider  acetaminophen (TYLENOL) 500 MG tablet Take 1 tablet (500 mg total) by mouth every 6 (six) hours as needed for mild pain or headache. 03/11/17  Yes Arrien, Jimmy Picket, MD  busPIRone (BUSPAR) 15 MG tablet TAKE 1 TABLET(15 MG) BY MOUTH TWICE DAILY Patient taking differently: Take 15 mg by mouth two times a day 04/19/16  Yes Smiley Houseman, MD  finasteride (PROSCAR) 5 MG tablet Take 5 mg by mouth at bedtime.   Yes [provider]  FLUoxetine (PROZAC) 20 MG capsule Take 20 mg by mouth daily.  03/30/16  Yes [provider]  Fluticasone-Salmeterol (ADVAIR) 100-50 MCG/DOSE AEPB Inhale 1-2 puffs 2 (two) times daily into the lungs.  11/05/14  Yes [provider]  furosemide (LASIX) 40 MG tablet Take 1 tablet (40 mg total) by mouth daily.  06/17/16  Yes Lucilla Lame, MD  gabapentin (NEURONTIN) 300 MG capsule Take 300 mg by mouth 3 (three) times daily.   Yes [provider]  hydrocortisone 2.5 % cream Apply 1 application topically 3 (three) times daily. 03/11/17 04/10/17 Yes Arrien, Jimmy Picket, MD  Melatonin 3 MG TABS Take 1 tablet (3 mg total) by mouth at bedtime as needed (sleep). Patient taking differently: Take 3 mg by mouth at bedtime.  10/24/15  Yes Sela Hilding, MD  modafinil (PROVIGIL) 200 MG tablet Take 200 mg by mouth daily. 08/04/16  Yes [provider]  ondansetron (ZOFRAN) 4 MG tablet TAKE 1 TABLET(4 MG) BY MOUTH EVERY 8 HOURS AS NEEDED FOR NAUSEA OR VOMITING 03/12/17  Yes Stallings, Zoe A, MD  pantoprazole (PROTONIX) 40 MG tablet Take 1 tablet (40 mg total) by mouth daily. 12/30/15  Yes Haney, Alyssa A, MD  risperiDONE (RISPERDAL) 0.5 MG tablet Take 1 tablet (0.5 mg total) by mouth 3 (three) times daily as needed (agitation). Patient taking differently: Take 0.5  mg by mouth 2 (two) times daily.  02/06/16  Yes Eloise Levels, MD  rizatriptan (MAXALT) 10 MG tablet Take 1 tablet (10 mg total) by mouth as needed for migraine. May repeat in 2 hours if needed 12/06/16  Yes Jeffery, Chelle, PA-C  spironolactone (ALDACTONE) 100 MG tablet Take 1 tablet (100 mg total) by mouth daily. 11/23/16  Yes Varney Biles, MD  topiramate (TOPAMAX) 100 MG tablet Take 1 tablet (100 mg total) by mouth daily. 01/07/16  Yes Smiley Houseman, MD  albuterol (PROVENTIL HFA;VENTOLIN HFA) 108 (90 Base) MCG/ACT inhaler Inhale 2 puffs into the lungs every 6 (six) hours as needed for wheezing or shortness of breath.    [provider]  allopurinol (ZYLOPRIM) 100 MG tablet Take 100 mg by mouth daily.    [provider]  amLODipine (NORVASC) 10 MG tablet Take 10 mg by mouth daily.    [provider]  aspirin EC 81 MG tablet Take 81 mg by mouth daily.    [provider]  cholecalciferol  (VITAMIN D) 1000 units tablet Take 2,000 Units by mouth daily.    [provider]  donepezil (ARICEPT) 5 MG tablet Take 5 mg by mouth at bedtime.    [provider]  EPINEPHrine (EPIPEN 2-PAK) 0.3 mg/0.3 mL IJ SOAJ injection Inject 0.3 mg into the muscle once.    [provider]  fluticasone (FLONASE) 50 MCG/ACT nasal spray Place 1 spray into both nostrils as needed for allergies or rhinitis.    [provider]  gabapentin (NEURONTIN) 400 MG capsule Take 1 capsule (400 mg total) 3 (three) times daily by mouth. Patient not taking: Reported on 03/14/2017 12/26/16   Patrecia Pour, NP  ipratropium-albuterol (DUONEB) 0.5-2.5 (3) MG/3ML SOLN Take 3 mLs by nebulization every 6 (six) hours as needed. 03/11/17 04/10/17  Arrien, Jimmy Picket, MD  lisinopril (PRINIVIL,ZESTRIL) 10 MG tablet Take 10 mg by mouth at bedtime.    [provider]  memantine (NAMENDA) 10 MG tablet Take 10 mg by mouth 2 (two) times daily.    [provider]  metFORMIN (GLUCOPHAGE) 500 MG tablet Take 500 mg by mouth daily with breakfast.    [provider]  nicotine (NICODERM CQ - DOSED IN MG/24 HOURS) 21 mg/24hr patch Place 21 mg onto the skin daily. 08/04/15   [provider]  ondansetron (ZOFRAN) 8 MG tablet Take 8 mg by mouth every 8 (eight) hours as needed for nausea or vomiting.  03/12/17   [provider]  potassium chloride 20 MEQ/15ML (10%) SOLN Take 30 mLs (40 mEq total) by mouth 2 (two) times daily. Patient not taking: Reported on 03/14/2017 01/10/17   Charlynne Cousins, MD  rifaximin (XIFAXAN) 550 MG TABS tablet Take 1 tablet (550 mg total) by mouth 2 (two) times daily. Patient not taking: Reported on 01/17/2017 06/05/15   Orson Eva, MD  sertraline (ZOLOFT) 50 MG tablet Take 50 mg by mouth daily.    [provider]    Family History Family History  Problem Relation Age of Onset  . Arrhythmia Mother   . Heart disease Father   .  Hypertension Father     Social History Social History   Tobacco Use  . Smoking status: Current Every Day Smoker    Packs/day: 1.00    Years: 20.00    Pack years: 20.00    Types: Cigarettes  . Smokeless tobacco: Never Used  . Tobacco comment: Currently Vaping Daily  Substance Use  Topics  . Alcohol use: Yes    Alcohol/week: 12.6 oz    Types: 21 Glasses of wine per week  . Drug use: No     Allergies   Patient has no known allergies.   Review of Systems Review of Systems  Constitutional: Negative for fever.  HENT: Negative for congestion.   Eyes: Negative for visual disturbance.  Respiratory: Negative for cough and shortness of breath.   Cardiovascular: Negative for chest pain and leg swelling.  Gastrointestinal: Positive for rectal pain. Negative for constipation and diarrhea.       Small amount of blood on toilet tissue  Genitourinary: Negative for flank pain, hematuria and urgency.  Musculoskeletal: Negative for back pain.  Skin: Negative for rash.  Neurological: Negative for dizziness and headaches.     Physical Exam Updated Vital Signs BP 123/61 (BP Location: Left Arm)   Pulse 92   Temp 99 F (37.2 C) (Oral)   Resp 16   SpO2 92%   Physical Exam  Constitutional: She is oriented to person, place, and time. She appears well-developed and well-nourished.  HENT:  Head: Normocephalic and atraumatic.  Eyes: Conjunctivae and EOM are normal. Pupils are equal, round, and reactive to light.  Neck: Normal range of motion. Neck supple.  Cardiovascular: Normal rate, regular rhythm, normal heart sounds and intact distal pulses.  No murmur heard. Pulmonary/Chest: Effort normal and breath sounds normal. No respiratory distress. She has no wheezes. She has no rales.  No tachypnea and no respiratory distress  Abdominal: Soft. Bowel sounds are normal.  Midline surgical scar that seems to be well healing with surrounding scarring but no evidence of cellulitis or infection.  No warmth or drainage noted. Pt has subjective epigastric TTP, but is distractible during exam without guarding. No focal TTP and no peritoneal signs. No rebound.  Musculoskeletal: She exhibits no edema.  No LE edema, redness, pain  Neurological: She is alert and oriented to person, place, and time.  Skin: Skin is warm and dry. Capillary refill takes less than 2 seconds.  Psychiatric:  Pt is anxious and tangential with her thoughts  Nursing note and vitals reviewed.    ED Treatments / Results  Labs (all labs ordered are listed, but only abnormal results are displayed) Labs Reviewed  COMPREHENSIVE METABOLIC PANEL - Abnormal; Notable for the following components:      Result Value   Potassium 3.4 (*)    Chloride 97 (*)    BUN 5 (*)    Creatinine, Ser 0.35 (*)    Calcium 8.7 (*)    AST 45 (*)    Alkaline Phosphatase 152 (*)    All other components within normal limits  ETHANOL - Abnormal; Notable for the following components:   Alcohol, Ethyl (B) 13 (*)    All other components within normal limits  CBC WITH DIFFERENTIAL/PLATELET - Abnormal; Notable for the following components:   Hemoglobin 11.9 (*)    RDW 15.7 (*)    All other components within normal limits  URINALYSIS, ROUTINE W REFLEX MICROSCOPIC - Abnormal; Notable for the following components:   Hgb urine dipstick MODERATE (*)    Leukocytes, UA TRACE (*)    Bacteria, UA RARE (*)    Squamous Epithelial / LPF 0-5 (*)    All other components within normal limits  RAPID URINE DRUG SCREEN, HOSP PERFORMED  I-STAT BETA HCG BLOOD, ED ([facility], [facility], [facility] ONLY)  I-STAT BETA HCG BLOOD, ED ([facility], [facility], [facility] ONLY)    EKG  EKG Interpretation None       Radiology Dg Chest 2 View  Result Date: 03/14/2017 CLINICAL DATA:  Cough EXAM: CHEST  2 VIEW COMPARISON:  03/07/2017, 01/02/2017 FINDINGS: Hyperinflation with mild diffuse coarse interstitial opacity, likely chronic. No focal pulmonary opacity, consolidation or pleural effusion is evident.  Stable cardiomediastinal silhouette. No pneumothorax. No acute osseous abnormality. IMPRESSION: No active cardiopulmonary disease. Electronically Signed   By: Donavan Foil M.D.   On: 03/14/2017 18:37    Procedures Procedures (including critical care time)  Medications Ordered in ED Medications  metFORMIN (GLUCOPHAGE) tablet 500 mg (not administered)  acetaminophen (TYLENOL) tablet 500 mg (not administered)  albuterol (PROVENTIL HFA;VENTOLIN HFA) 108 (90 Base) MCG/ACT inhaler 2 puff (2 puffs Inhalation Given 03/14/17 2147)  busPIRone (BUSPAR) tablet 15 mg (15 mg Oral Given 03/14/17 2131)  FLUoxetine (PROZAC) capsule 20 mg (20 mg Oral Given 03/14/17 2130)  fluticasone (FLONASE) 50 MCG/ACT nasal spray 1 spray (not administered)  fluticasone furoate-vilanterol (BREO ELLIPTA) 100-25 MCG/INH 1 puff (not administered)  furosemide (LASIX) tablet 40 mg (40 mg Oral Refused 03/14/17 2133)  gabapentin (NEURONTIN) capsule 300 mg (300 mg Oral Given 03/14/17 2129)  hydrocortisone cream 1 % 1 application (not administered)  Melatonin TABS 3 mg (3 mg Oral Refused 03/14/17 2028)  nicotine (NICODERM CQ - dosed in mg/24 hours) patch 21 mg ( Transdermal Canceled Entry 03/14/17 2028)  pantoprazole (PROTONIX) EC tablet 40 mg (40 mg Oral Given 03/14/17 2129)  ondansetron (ZOFRAN) tablet 4 mg (not administered)  risperiDONE (RISPERDAL) tablet 0.5 mg (0.5 mg Oral Given 03/14/17 2130)  topiramate (TOPAMAX) tablet 100 mg (100 mg Oral Given 03/14/17 2130)  LORazepam (ATIVAN) injection 0-4 mg (0 mg Intravenous Not Given 03/15/17 0231)    Or  LORazepam (ATIVAN) tablet 0-4 mg ( Oral See Alternative 03/15/17 0231)  LORazepam (ATIVAN) injection 0-4 mg (not administered)    Or  LORazepam (ATIVAN) tablet 0-4 mg (not administered)  thiamine (VITAMIN B-1) tablet 100 mg (100 mg Oral Refused 03/14/17 2132)    Or  thiamine (B-1) injection 100 mg ( Intravenous See Alternative 03/14/17 2132)  ondansetron (ZOFRAN-ODT) disintegrating tablet 4 mg (4 mg  Oral Given 03/14/17 1821)  pantoprazole (PROTONIX) EC tablet 40 mg (40 mg Oral Given 03/14/17 1820)  LORazepam (ATIVAN) tablet 2 mg (2 mg Oral Given 03/14/17 1918)     Initial Impression / Assessment and Plan / ED Course  I have reviewed the triage vital signs and the nursing notes.  Pertinent labs & imaging results that were available during my care of the patient were reviewed by me and considered in my medical decision making (see chart for details).    Discussed pt presentation and exam findings with Dr. Johnney Killian, who evaluated the patient, reviewed the workup and agrees that patient is medically cleared for TTS consult.  Rechecked pt and repeat abd exam reveals soft abdomen. Discussed plan for TTS consult.  Rechecked pt and she is sleeping in NAD.     Final Clinical Impressions(s) / ED Diagnoses   Final diagnoses:  None   57 y/o F presenting after being IVC'ed for volatile behavior. Has some somatic complaints, but no consistent complaints or findings on exam that would warrant further workup. Her lungs are CTA, heart with RRR and no murmurs. Abd with normal BS and is soft. Has some mild TTP that is likely residual from prior exploratory lap. No rebound or guarding. No peritoneal signs, low concern for SBP/ascites or other acute intraabdominal pathology. Pt not complaining  of abdominal pain. Dry heaving not consistent and no episodes of vomiting. NAD after ativan, zofran, and protonix. UA with hgb, trace, leuks, bacteria, and 0-5 squamous cells, likely contamination, but will recommend pt f/u. Negative UDS. ETOH 13. CBC with no WBC elevation hgb slightly low, but actually improved for patient and above recent baseline. AST slightly elevated to 45, but not concerning. Alk phos elevated to 152 but appears to be baseline for patient. Pt is afebrile, with normal vital signs. Pt is cleared for TTS consult by myself and dr. Johnney Killian.    ED Discharge Orders    None       Rodney Booze,  [place] 03/15/17 1610    Charlesetta Shanks, MD 03/15/17 [phone]

## 2017-03-14 NOTE — ED Notes (Signed)
Patient interrupting staff multiple times, requesting medications for multiple ailments. Pt with varied somatic complaints and is demanding to have "All of my meds now!". Pt sitting up eating dinner tray at this time, while at the same time complaining of nausea. Pt not able to be reasoned with. Pt unable to be redirected and will not listen to staff explanations for normal ordering processes. No signs of distress noted at this time; respirations regular and unlabored. Pt up to bathroom without any difficulty or shortness of breath. Dr. Donnald Garre aware of pt requests; awaiting orders.

## 2017-03-14 NOTE — ED Notes (Signed)
Bed: UJ81 Expected date:  Expected time:  Means of arrival:  Comments: Sheriff-IVC
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
## 2017-03-15 DIAGNOSIS — Z79899 Other long term (current) drug therapy: Secondary | ICD-10-CM | POA: Diagnosis not present

## 2017-03-15 DIAGNOSIS — F17210 Nicotine dependence, cigarettes, uncomplicated: Secondary | ICD-10-CM | POA: Diagnosis not present

## 2017-03-15 DIAGNOSIS — F17290 Nicotine dependence, other tobacco product, uncomplicated: Secondary | ICD-10-CM | POA: Diagnosis not present

## 2017-03-15 DIAGNOSIS — F1020 Alcohol dependence, uncomplicated: Secondary | ICD-10-CM

## 2017-03-15 DIAGNOSIS — Y900 Blood alcohol level of less than 20 mg/100 ml: Secondary | ICD-10-CM | POA: Diagnosis not present
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
## 2017-03-15 NOTE — BHH Suicide Risk Assessment (Signed)
Suicide Risk Assessment  Discharge Assessment   [facility] Discharge Suicide Risk Assessment   Principal Problem: Alcohol use disorder, severe, dependence [facility]) Discharge Diagnoses:  Patient Active Problem List   Diagnosis Date Noted  . Abdominal wall cellulitis [L03.311] 12/30/2016  . S/P exploratory laparotomy [Z98.890] 12/30/2016  . Distal radius fracture, left [S52.502A] 09/09/2016  . Thrombocytopenia (HCC) [D69.6] 09/09/2016  . Chest x-ray abnormality [R93.89]   . Umbilical hernia without obstruction and without gangrene [K42.9] 05/12/2016  . Itching [L29.9] 02/24/2016  . Ventral hernia without obstruction or gangrene [K43.9] 02/24/2016  . Palliative care encounter [Z51.5]   . Ascites [R18.8]   . Tachypnea [R06.82]   . Typical atrial flutter (HCC) [I48.3]   . Hypomagnesemia [E83.42]   . Hypoxia [R09.02]   . Dehydration [E86.0] 01/22/2016  . Low back ache [M54.5] 12/30/2015  . Non-intractable vomiting with nausea [R11.2]   . Alcohol abuse with alcohol-induced mood disorder (HCC) [F10.14] 12/15/2015  . Acute alcoholism (HCC) [F10.20]   . Abdominal distension [R14.0] 11/04/2015  . Alcohol abuse [F10.10]   . Dyspnea [R06.00]   . Acute respiratory failure (HCC) [J96.00] 10/15/2015  . Ascites due to alcoholic cirrhosis (HCC) [K70.31] 10/15/2015  . Osteoporosis [M81.0]   . Bipolar I disorder (HCC) [F31.9]   . Multiple sclerosis (HCC) [G35]   . Alcoholic cirrhosis of liver with ascites (HCC) [K70.31] 08/22/2015  . Bipolar disorder, current episode mixed, moderate (HCC) [F31.62] 08/01/2015  . Alcohol use disorder, severe, dependence (HCC) [F10.20] 07/31/2015  . Macrocytic anemia- due to alcohol abuse with normal B12 & folate levels [D53.9] 05/31/2015  . Severe protein-calorie malnutrition (HCC) [E43] 05/31/2015  . Hypokalemia [E87.6] 05/26/2015  . Encephalopathy, hepatic (HCC) [K72.90] 05/26/2015  . Alcohol withdrawal (HCC) [F10.239] 05/18/2015  . Abdominal pain [R10.9] 05/18/2015   . Anxiety [F41.9] 05/18/2015  . UTI (lower urinary tract infection) [N39.0] 05/18/2015  . Stimulant abuse (HCC) [F15.10] 12/27/2014  . Nicotine abuse [Z72.0] 12/27/2014  . Hyperprolactinemia (HCC) [E22.1] 11/15/2014  . Dyslipidemia [E78.5]   . Tobacco abuse [Z72.0] 01/23/2014  . Noncompliance with therapeutic plan [Z91.11] 04/04/2013  . Unspecified hereditary and idiopathic peripheral neuropathy [G60.9] 05/01/2012  . GERD (gastroesophageal reflux disease) [K21.9] 05/01/2012  . Osteoarthrosis, unspecified whether generalized or localized, involving lower leg [M17.10] 05/01/2012  . Hyponatremia [E87.1] 07/01/2011    Total Time spent with patient: 45 minutes  Musculoskeletal: Strength & Muscle Tone: within normal limits Gait & Station: normal Patient leans: N/A  Psychiatric Specialty Exam: Physical Exam  Constitutional: She is oriented to person, place, and time. She appears well-developed and well-nourished.  HENT:  Head: Normocephalic.  Respiratory: Effort normal.  Musculoskeletal: Normal range of motion.  Neurological: She is alert and oriented to person, place, and time.  Psychiatric: Her speech is normal and behavior is normal. Thought content normal. Her mood appears anxious. Cognition and memory are normal. She expresses impulsivity. She exhibits a depressed mood.   Review of Systems  Psychiatric/Behavioral: Positive for depression and substance abuse. Negative for hallucinations, memory loss and suicidal ideas. The patient is not nervous/anxious and does not have insomnia.   All other systems reviewed and are negative.  Blood pressure 123/61, pulse 92, temperature 99 F (37.2 C), temperature source Oral, resp. rate 16, SpO2 92 %.There is no height or weight on file to calculate BMI. General Appearance: Casual Eye Contact:  Good Speech:  Clear and Coherent and Normal Rate Volume:  Normal Mood:  Anxious and Depressed Affect:  Congruent and Depressed Thought Process:  Coherent, Goal Directed and Linear Orientation:  Full (Time, Place, and Person) Thought Content:  Logical Suicidal Thoughts:  No Homicidal Thoughts:  No Memory:  Immediate;   Good Recent;   Good Remote;   Fair Judgement:  Fair Insight:  Fair Psychomotor Activity:  Normal Concentration:  Concentration: Good and Attention Span: Good Recall:  Good Fund of Knowledge:  Good Language:  Good Akathisia:  No Handed:  Right AIMS (if indicated):    Assets:  Communication Skills Desire for Improvement Financial Resources/Insurance Housing Social Support ADL's:  Intact Cognition:  WNL   Mental Status Per Nursing Assessment::   On Admission:   Intoxicated  Demographic Factors:  Caucasian  Loss Factors: Financial problems/change in socioeconomic status  Historical Factors: Impulsivity  Risk Reduction Factors:   Sense of responsibility to family  Continued Clinical Symptoms:  Depression:   Impulsivity Alcohol/Substance Abuse/Dependencies  Cognitive Features That Contribute To Risk:  Closed-mindedness    Suicide Risk:  Minimal: No identifiable suicidal ideation.  Patients presenting with no risk factors but with morbid ruminations; may be classified as minimal risk based on the severity of the depressive symptoms  Follow-up Information    Doristine Bosworth, MD.   Specialty:  Internal Medicine Why:  For recheck of your symptoms and to review your labwork and urinalysis from this visit Contact information: [address] [place] [address] [phone]        [facility]-EMERGENCY DEPT.   Specialty:  Emergency Medicine Why:  Return to the ER with any new or worsening symptoms Contact information: [address] [id number] [place] [address] [phone]          Plan Of Care/Follow-up recommendations:  Activity:  as tolerated Diet:  Heart Healthy  Laveda Abbe, NP 03/15/2017, 11:36 AM

## 2017-03-15 NOTE — Consult Note (Signed)
[facility] Psychiatry Consult   Reason for Consult:  Involuntary commitment due to alcohol addiction Referring Physician:  EDP [name] MRN:  [id number] Principal Diagnosis: Alcohol use disorder, severe, dependence ([place]) Diagnosis:   Patient Active Problem List   Diagnosis Date Noted  . Abdominal wall cellulitis [H41.740] 12/30/2016  . S/P exploratory laparotomy [Z98.890] 12/30/2016  . Distal radius fracture, left [S52.502A] 09/09/2016  . Thrombocytopenia ([place]) [D69.6] 09/09/2016  . Chest x-ray abnormality [R93.89]   . Umbilical hernia without obstruction and without gangrene [K42.9] 05/12/2016  . Itching [L29.9] 02/24/2016  . Ventral hernia without obstruction or gangrene [K43.9] 02/24/2016  . Palliative care encounter [Z51.5]   . Ascites [R18.8]   . Tachypnea [R06.82]   . Typical atrial flutter (HCC) [I48.3]   . Hypomagnesemia [E83.42]   . Hypoxia [R09.02]   . Dehydration [E86.0] 01/22/2016  . Low back ache [M54.5] 12/30/2015  . Non-intractable vomiting with nausea [R11.2]   . Alcohol abuse with alcohol-induced mood disorder ([place]) [F10.14] 12/15/2015  . Acute alcoholism ([place]) [F10.20]   . Abdominal distension [R14.0] 11/04/2015  . Alcohol abuse [F10.10]   . Dyspnea [R06.00]   . Acute respiratory failure ([place]) [J96.00] 10/15/2015  . Ascites due to alcoholic cirrhosis ([place]) [C14.48] 10/15/2015  . Osteoporosis [M81.0]   . Bipolar I disorder ([place]) [F31.9]   . Multiple sclerosis ([place]) [G35]   . Alcoholic cirrhosis of liver with ascites ([place]) [K70.31] 08/22/2015  . Bipolar disorder, current episode mixed, moderate ([place]) [F31.62] 08/01/2015  . Alcohol use disorder, severe, dependence ([place]) [F10.20] 07/31/2015  . Macrocytic anemia- due to alcohol abuse with normal B12 & folate levels [D53.9] 05/31/2015  . Severe protein-calorie malnutrition ([place]) [E43] 05/31/2015  . Hypokalemia [E87.6] 05/26/2015  . Encephalopathy, hepatic ([place]) [K72.90]  05/26/2015  . Alcohol withdrawal ([place]) [F10.239] 05/18/2015  . Abdominal pain [R10.9] 05/18/2015  . Anxiety [F41.9] 05/18/2015  . UTI (lower urinary tract infection) [N39.0] 05/18/2015  . Stimulant abuse ([place]) [F15.10] 12/27/2014  . Nicotine abuse [Z72.0] 12/27/2014  . Hyperprolactinemia ([place]) [E22.1] 11/15/2014  . Dyslipidemia [E78.5]   . Tobacco abuse [Z72.0] 01/23/2014  . Noncompliance with therapeutic plan [Z91.11] 04/04/2013  . Unspecified hereditary and idiopathic peripheral neuropathy [G60.9] 05/01/2012  . GERD (gastroesophageal reflux disease) [K21.9] 05/01/2012  . Osteoarthrosis, unspecified whether generalized or localized, involving lower leg [M17.10] 05/01/2012  . Hyponatremia [E87.1] 07/01/2011    Total Time spent with patient: 45 minutes  Subjective:   Anne Black is a 57 y.o. female patient admitted with alcohol addiction.  HPI:  Pt was seen and chart reviewed with treatment team and Dr Mariea Clonts. Pt stated her mother placed her under IVC but she isn't sure why. Pt admits to daily drinking and wants to go to rehab. UDS negative, BAL 13. Pt tried to go to [name] but was turned down due to health issues.  Pt denies suicidal/homicidal ideation, denies auditory/visual hallucinations and does not appear to be responding to internal stimuli. Pt will be seen by peer Support for substance abuse treatment resources in the community. Pt is psychiatrically clear for discharge.   Past Psychiatric History: As above  Risk to Self: None Risk to Others: None Prior Inpatient Therapy: Prior Inpatient Therapy: Yes Prior Therapy Dates: 2017 Prior Therapy Facilty/Provider(s): [facility] Reason for Treatment: Bipolar D/O, Alcohol Use Prior Outpatient Therapy: Prior Outpatient Therapy: Yes Prior Therapy Dates: 2012 Prior Therapy Facilty/Provider(s): [facility] Reason for Treatment: depression Does patient have an ACCT team?: No Does patient  have Intensive In-House Services?  :  No Does patient have [name] services? : No Does patient have P4CC services?: No  Past Medical History:  Past Medical History:  Diagnosis Date  . ADHD (attention deficit hyperactivity disorder)   . Anxiety   . Arthritis   . Ascites   . Bipolar disorder ([place])   . Cirrhosis ([place])   . Hypokalemia   . Migraine   . Multiple sclerosis ([place])   . Osteoporosis     Past Surgical History:  Procedure Laterality Date  . ABDOMINAL WALL DEFECT REPAIR N/A 12/30/2016   Procedure: EXPLORATORY LAPAROTOMY WITH REPAIR ABDOMINAL WALL VENTRAL HERNIA;  Surgeon: Rolm Bookbinder, MD;  Location: [facility];  Service: General;  Laterality: N/A;  . APPLICATION OF WOUND VAC N/A 12/30/2016   Procedure: APPLICATION OF WOUND VAC;  Surgeon: Rolm Bookbinder, MD;  Location: [facility];  Service: General;  Laterality: N/A;  . CESAREAN SECTION  [id number]  . FRACTURE SURGERY    . HERNIA REPAIR    . MYRINGOTOMY WITH TUBE PLACEMENT Bilateral   . ORIF WRIST FRACTURE Left 09/09/2016   Procedure: OPEN REDUCTION INTERNAL FIXATION (ORIF) LEFT WRIST FRACTURE, LEFT CARPAL TUNNEL RELEASE;  Surgeon: Roseanne Kaufman, MD;  Location: [facility];  Service: Orthopedics;  Laterality: Left;  . TONSILLECTOMY     Family History:  Family History  Problem Relation Age of Onset  . Arrhythmia Mother   . Heart disease Father   . Hypertension Father    Family Psychiatric  History: Unknown Social History:  Social History   Substance and Sexual Activity  Alcohol Use Yes  . Alcohol/week: 12.6 oz  . Types: 21 Glasses of wine per week     Social History   Substance and Sexual Activity  Drug Use No    Social History   Socioeconomic History  . Marital status: Divorced    Spouse name: n/a  . Number of children: 5  . Years of education: College  . Highest education level: None  Social Needs  . Financial resource strain: None  . Food insecurity - worry: None  . Food insecurity - inability: None  . Transportation needs - medical: None   . Transportation needs - non-medical: None  Occupational History  . Occupation: disabled    Comment: on social security disability for MS  Tobacco Use  . Smoking status: Current Every Day Smoker    Packs/day: 1.00    Years: 20.00    Pack years: 20.00    Types: Cigarettes  . Smokeless tobacco: Never Used  . Tobacco comment: Currently Vaping Daily  Substance and Sexual Activity  . Alcohol use: Yes    Alcohol/week: 12.6 oz    Types: 21 Glasses of wine per week  . Drug use: No  . Sexual activity: Not Currently  Other Topics Concern  . None  Social History Narrative   Former Teaching laboratory technician.   Started graduate school in counseling, but didn't finish when a good job came open.   Lives alone.   One daughter lives in [place].   Another daughter is in [place].   The other three live in [place], along with her mother.   Father lives in the [place], [place] area.   Additional Social History: N/A    Allergies:  No Known Allergies  Labs:  Results for orders placed or performed during the hospital encounter of 03/14/17 (from the past 48 hour(s))  Comprehensive metabolic panel     Status: Abnormal   Collection Time: 03/14/17  6:05 PM  Result Value Ref Range   Sodium 137 135 - 145 mmol/L   Potassium 3.4 (L) 3.5 - 5.1 mmol/L   Chloride 97 (L) 101 - 111 mmol/L   CO2 28 22 - 32 mmol/L   Glucose, Bld 88 65 - 99 mg/dL   BUN 5 (L) 6 - 20 mg/dL   Creatinine, Ser 0.35 (L) 0.44 - 1.00 mg/dL   Calcium 8.7 (L) 8.9 - 10.3 mg/dL   Total Protein 7.5 6.5 - 8.1 g/dL   Albumin 4.1 3.5 - 5.0 g/dL   AST 45 (H) 15 - 41 U/L   ALT 30 14 - 54 U/L   Alkaline Phosphatase 152 (H) 38 - 126 U/L   Total Bilirubin 0.6 0.3 - 1.2 mg/dL   GFR calc non Af Amer >60 >60 mL/min   GFR calc Af Amer >60 >60 mL/min    Comment: (NOTE) The eGFR has been calculated using the CKD EPI equation. This calculation has not been validated in all clinical situations. eGFR's persistently <60 mL/min signify  possible Chronic Kidney Disease.    Anion gap 12 5 - 15    Comment: Performed at [facility], [place] [address] [place], [place] [address]  CBC with Diff     Status: Abnormal   Collection Time: 03/14/17  6:05 PM  Result Value Ref Range   WBC 8.3 4.0 - 10.5 K/uL   RBC 4.44 3.87 - 5.11 MIL/uL   Hemoglobin 11.9 (L) 12.0 - 15.0 g/dL   HCT 38.2 36.0 - 46.0 %   MCV 86.0 78.0 - 100.0 fL   MCH 26.8 26.0 - 34.0 pg   MCHC 31.2 30.0 - 36.0 g/dL   RDW 15.7 (H) 11.5 - 15.5 %   Platelets 161 150 - 400 K/uL   Neutrophils Relative % 79 %   Neutro Abs 6.6 1.7 - 7.7 K/uL   Lymphocytes Relative 9 %   Lymphs Abs 0.8 0.7 - 4.0 K/uL   Monocytes Relative 9 %   Monocytes Absolute 0.8 0.1 - 1.0 K/uL   Eosinophils Relative 2 %   Eosinophils Absolute 0.2 0.0 - 0.7 K/uL   Basophils Relative 1 %   Basophils Absolute 0.1 0.0 - 0.1 K/uL    Comment: Performed at [facility], [place] [address] [place], [place] [address]  Ethanol     Status: Abnormal   Collection Time: 03/14/17  6:06 PM  Result Value Ref Range   Alcohol, Ethyl (B) 13 (H) <10 mg/dL    Comment:        LOWEST DETECTABLE LIMIT FOR SERUM ALCOHOL IS 10 mg/dL FOR MEDICAL PURPOSES ONLY Performed at [facility] [address] [place], [place] [address]   I-Stat beta hCG blood, ED     Status: None   Collection Time: 03/14/17  6:18 PM  Result Value Ref Range   I-stat hCG, quantitative <5.0 <5 mIU/mL   Comment 3            Comment:   GEST. AGE      CONC.  (mIU/mL)   <=1 WEEK        5 - 50     2 WEEKS       50 - 500     3 WEEKS       100 - 10,000     4 WEEKS     1,000 - 30,000        FEMALE AND NON-PREGNANT FEMALE:  LESS THAN 5 mIU/mL   Urine rapid drug screen (hosp performed)     Status: None   Collection Time: 03/14/17  6:42 PM  Result Value Ref Range   Opiates NONE DETECTED NONE DETECTED   Cocaine NONE DETECTED NONE DETECTED   Benzodiazepines NONE DETECTED NONE DETECTED    Amphetamines NONE DETECTED NONE DETECTED   Tetrahydrocannabinol NONE DETECTED NONE DETECTED   Barbiturates NONE DETECTED NONE DETECTED    Comment: (NOTE) DRUG SCREEN FOR MEDICAL PURPOSES ONLY.  IF CONFIRMATION IS NEEDED FOR ANY PURPOSE, NOTIFY LAB WITHIN 5 DAYS. LOWEST DETECTABLE LIMITS FOR URINE DRUG SCREEN Drug Class                     Cutoff (ng/mL) Amphetamine and metabolites    1000 Barbiturate and metabolites    200 Benzodiazepine                 263 Tricyclics and metabolites     300 Opiates and metabolites        300 Cocaine and metabolites        300 THC                            50 Performed at [facility], [place] [address], [place], [place] [address]   Urinalysis, Routine w reflex microscopic     Status: Abnormal   Collection Time: 03/14/17  6:42 PM  Result Value Ref Range   Color, Urine YELLOW YELLOW   APPearance CLEAR CLEAR   Specific Gravity, Urine 1.006 1.005 - 1.030   pH 8.0 5.0 - 8.0   Glucose, UA NEGATIVE NEGATIVE mg/dL   Hgb urine dipstick MODERATE (A) NEGATIVE   Bilirubin Urine NEGATIVE NEGATIVE   Ketones, ur NEGATIVE NEGATIVE mg/dL   Protein, ur NEGATIVE NEGATIVE mg/dL   Nitrite NEGATIVE NEGATIVE   Leukocytes, UA TRACE (A) NEGATIVE   RBC / HPF 0-5 0 - 5 RBC/hpf   WBC, UA 0-5 0 - 5 WBC/hpf   Bacteria, UA RARE (A) NONE SEEN   Squamous Epithelial / LPF 0-5 (A) NONE SEEN    Comment: Performed at [facility], [place] [address] [place], [place] [address]    Current Facility-Administered Medications  Medication Dose Route Frequency Provider Last Rate Last Dose  . acetaminophen (TYLENOL) tablet 500 mg  500 mg Oral Q6H PRN Charlesetta Shanks, MD   500 mg at 03/15/17 4128  . albuterol (PROVENTIL HFA;VENTOLIN HFA) 108 (90 Base) MCG/ACT inhaler 2 puff  2 puff Inhalation Q6H PRN Charlesetta Shanks, MD   2 puff at 03/14/17 2147  . busPIRone (BUSPAR) tablet 15 mg  15 mg Oral BID Charlesetta Shanks, MD   15 mg at 03/15/17 1030  .  FLUoxetine (PROZAC) capsule 20 mg  20 mg Oral Daily Charlesetta Shanks, MD   20 mg at 03/15/17 1031  . fluticasone (FLONASE) 50 MCG/ACT nasal spray 1 spray  1 spray Each Nare PRN Pfeiffer, Marcy, MD      . fluticasone furoate-vilanterol (BREO ELLIPTA) 100-25 MCG/INH 1 puff  1 puff Inhalation Daily Pfeiffer, Marcy, MD   1 puff at 03/15/17 0835  . furosemide (LASIX) tablet 40 mg  40 mg Oral Daily Charlesetta Shanks, MD   40 mg at 03/15/17 1031  . gabapentin (NEURONTIN) capsule 300 mg  300 mg Oral TID Charlesetta Shanks, MD   300 mg at 03/15/17 1031  . hydrocortisone cream 1 % 1 application  1 application Topical TID PRN Charlesetta Shanks, MD      . LORazepam (ATIVAN) injection 0-4 mg  0-4 mg Intravenous Q6H Couture, Cortni S, PA-C       Or  . LORazepam (ATIVAN) tablet 0-4 mg  0-4 mg Oral Q6H Couture, Cortni S, PA-C      . [START ON 03/17/2017] LORazepam (ATIVAN) injection 0-4 mg  0-4 mg Intravenous Q12H Couture, Cortni S, PA-C       Or  . [START ON 03/17/2017] LORazepam (ATIVAN) tablet 0-4 mg  0-4 mg Oral Q12H Couture, Cortni S, PA-C      . Melatonin TABS 3 mg  3 mg Oral QHS Pfeiffer, Marcy, MD      . metFORMIN (GLUCOPHAGE) tablet 500 mg  500 mg Oral Q breakfast Charlesetta Shanks, MD   500 mg at 03/15/17 0837  . nicotine (NICODERM CQ - dosed in mg/24 hours) patch 21 mg  21 mg Transdermal Daily Charlesetta Shanks, MD   21 mg at 03/15/17 1032  . ondansetron (ZOFRAN) tablet 4 mg  4 mg Oral Q8H PRN Charlesetta Shanks, MD      . pantoprazole (PROTONIX) EC tablet 40 mg  40 mg Oral Daily Charlesetta Shanks, MD   40 mg at 03/15/17 1031  . risperiDONE (RISPERDAL) tablet 0.5 mg  0.5 mg Oral BID Charlesetta Shanks, MD   0.5 mg at 03/15/17 1031  . thiamine (VITAMIN B-1) tablet 100 mg  100 mg Oral Daily Couture, Cortni S, PA-C   100 mg at 03/15/17 1031   Or  . thiamine (B-1) injection 100 mg  100 mg Intravenous Daily Couture, Cortni S, PA-C      . topiramate (TOPAMAX) tablet 100 mg  100 mg Oral Daily Charlesetta Shanks, MD   100 mg at  03/15/17 1031   Current Outpatient Medications  Medication Sig Dispense Refill  . acetaminophen (TYLENOL) 500 MG tablet Take 1 tablet (500 mg total) by mouth every 6 (six) hours as needed for mild pain or headache. 20 tablet 0  . busPIRone (BUSPAR) 15 MG tablet TAKE 1 TABLET(15 MG) BY MOUTH TWICE DAILY (Patient taking differently: Take 15 mg by mouth two times a day) 60 tablet 0  . finasteride (PROSCAR) 5 MG tablet Take 5 mg by mouth at bedtime.    Marland Kitchen FLUoxetine (PROZAC) 20 MG capsule Take 20 mg by mouth daily.     . Fluticasone-Salmeterol (ADVAIR) 100-50 MCG/DOSE AEPB Inhale 1-2 puffs 2 (two) times daily into the lungs.     . furosemide (LASIX) 40 MG tablet Take 1 tablet (40 mg total) by mouth daily. 30 tablet 5  . gabapentin (NEURONTIN) 300 MG capsule Take 300 mg by mouth 3 (three) times daily.    . hydrocortisone 2.5 % cream Apply 1 application topically 3 (three) times daily. 30 g 0  . Melatonin 3 MG TABS Take 1 tablet (3 mg total) by mouth at bedtime as needed (sleep). (Patient taking differently: Take 3 mg by mouth at bedtime. ) 30 tablet 0  . modafinil (PROVIGIL) 200 MG tablet Take 200 mg by mouth daily.  1  . ondansetron (ZOFRAN) 4 MG tablet TAKE 1 TABLET(4 MG) BY MOUTH EVERY 8 HOURS AS NEEDED FOR NAUSEA OR VOMITING 20 tablet 0  . pantoprazole (PROTONIX) 40 MG tablet Take 1 tablet (40 mg total) by mouth daily. 30 tablet 3  . risperiDONE (RISPERDAL) 0.5 MG tablet Take 1 tablet (0.5 mg total) by mouth 3 (three) times daily as needed (agitation). (Patient taking differently:  Take 0.5 mg by mouth 2 (two) times daily. ) 30 tablet 0  . rizatriptan (MAXALT) 10 MG tablet Take 1 tablet (10 mg total) by mouth as needed for migraine. May repeat in 2 hours if needed 10 tablet 0  . spironolactone (ALDACTONE) 100 MG tablet Take 1 tablet (100 mg total) by mouth daily. 30 tablet 1  . topiramate (TOPAMAX) 100 MG tablet Take 1 tablet (100 mg total) by mouth daily. 30 tablet 0  . albuterol (PROVENTIL  HFA;VENTOLIN HFA) 108 (90 Base) MCG/ACT inhaler Inhale 2 puffs into the lungs every 6 (six) hours as needed for wheezing or shortness of breath.    . allopurinol (ZYLOPRIM) 100 MG tablet Take 100 mg by mouth daily.    Marland Kitchen amLODipine (NORVASC) 10 MG tablet Take 10 mg by mouth daily.    Marland Kitchen aspirin EC 81 MG tablet Take 81 mg by mouth daily.    . cholecalciferol (VITAMIN D) 1000 units tablet Take 2,000 Units by mouth daily.    Marland Kitchen donepezil (ARICEPT) 5 MG tablet Take 5 mg by mouth at bedtime.    Marland Kitchen EPINEPHrine (EPIPEN 2-PAK) 0.3 mg/0.3 mL IJ SOAJ injection Inject 0.3 mg into the muscle once.    . fluticasone (FLONASE) 50 MCG/ACT nasal spray Place 1 spray into both nostrils as needed for allergies or rhinitis.    Marland Kitchen gabapentin (NEURONTIN) 400 MG capsule Take 1 capsule (400 mg total) 3 (three) times daily by mouth. (Patient not taking: Reported on 03/14/2017) 90 capsule 0  . ipratropium-albuterol (DUONEB) 0.5-2.5 (3) MG/3ML SOLN Take 3 mLs by nebulization every 6 (six) hours as needed. 360 mL 0  . lisinopril (PRINIVIL,ZESTRIL) 10 MG tablet Take 10 mg by mouth at bedtime.    . memantine (NAMENDA) 10 MG tablet Take 10 mg by mouth 2 (two) times daily.    . metFORMIN (GLUCOPHAGE) 500 MG tablet Take 500 mg by mouth daily with breakfast.    . nicotine (NICODERM CQ - DOSED IN MG/24 HOURS) 21 mg/24hr patch Place 21 mg onto the skin daily.    . ondansetron (ZOFRAN) 8 MG tablet Take 8 mg by mouth every 8 (eight) hours as needed for nausea or vomiting.     . potassium chloride 20 MEQ/15ML (10%) SOLN Take 30 mLs (40 mEq total) by mouth 2 (two) times daily. (Patient not taking: Reported on 03/14/2017) 60 mL 0  . rifaximin (XIFAXAN) 550 MG TABS tablet Take 1 tablet (550 mg total) by mouth 2 (two) times daily. (Patient not taking: Reported on 01/17/2017) 60 tablet 0  . sertraline (ZOLOFT) 50 MG tablet Take 50 mg by mouth daily.      Musculoskeletal: Strength & Muscle Tone: within normal limits Gait & Station: normal Patient  leans: N/A  Psychiatric Specialty Exam: Physical Exam  Nursing note and vitals reviewed. Constitutional: She is oriented to person, place, and time. She appears well-developed and well-nourished.  HENT:  Head: Normocephalic.  Neck: Normal range of motion.  Respiratory: Effort normal.  Musculoskeletal: Normal range of motion.  Neurological: She is alert and oriented to person, place, and time.  Psychiatric: Her speech is normal and behavior is normal. Thought content normal. Her mood appears anxious. Cognition and memory are normal. She expresses impulsivity. She exhibits a depressed mood.    Review of Systems  Psychiatric/Behavioral: Positive for depression and substance abuse. Negative for hallucinations, memory loss and suicidal ideas. The patient is not nervous/anxious and does not have insomnia.   All other systems reviewed and are  negative.   Blood pressure 123/61, pulse 92, temperature 99 F (37.2 C), temperature source Oral, resp. rate 16, SpO2 92 %.There is no height or weight on file to calculate BMI.  General Appearance: Casual  Eye Contact:  Good  Speech:  Clear and Coherent and Normal Rate  Volume:  Normal  Mood:  Anxious and Depressed  Affect:  Congruent and Depressed  Thought Process:  Coherent, Goal Directed and Linear  Orientation:  Full (Time, Place, and Person)  Thought Content:  Logical  Suicidal Thoughts:  No  Homicidal Thoughts:  No  Memory:  Immediate;   Good Recent;   Good Remote;   Fair  Judgement:  Fair  Insight:  Fair  Psychomotor Activity:  Normal  Concentration:  Concentration: Good and Attention Span: Good  Recall:  Good  Fund of Knowledge:  Good  Language:  Good  Akathisia:  No  Handed:  Right  AIMS (if indicated):    N/A  Assets:  Communication Skills Desire for Improvement Financial Resources/Insurance Housing Social Support  ADL's:  Intact  Cognition:  WNL  Sleep:    N/A     Treatment Plan Summary: Plan Alcohol use disorder,  severe, dependence ([place])  Discharge Home Take all medications as prescribed Avoid the use of alcohol and illicit drugs Follow up with Jalene Mullet and Peer Support  Disposition: No evidence of imminent risk to self or others at present.   Patient does not meet criteria for psychiatric inpatient admission. Supportive therapy provided about ongoing stressors. Discussed crisis plan, support from social network, calling 911, coming to the Emergency Department, and calling Suicide Hotline.  Ethelene Hal, NP 03/15/2017 11:25 AM   Patient seen face-to-face for psychiatric evaluation, chart reviewed and case discussed with the physician extender and developed treatment plan. Reviewed the information documented and agree with the treatment plan.  Buford Dresser, DO 03/15/17 10:58 PM

## 2017-03-15 NOTE — B[facility] Counselor (Signed)
Pt ex-husband Azucena Kuba [phone], the person who initiated the IVC, was called.  There was not an answer and a HIPPA compliant message was left.

## 2017-03-15 NOTE — Patient Outreach (Signed)
CPSS spoke with the patient and provided substance use recovery support. Patient became agitated that she was about to be discharged and that CPSS did not have access for transportation to different treatment centers. CPSS explained to the patient that we cannot hold patients from being discharged. Patient has been to multiple different residential rehab programs in the past including [facility] and [address] [facility] [place]. Patient was denied at [facility] and [facility] due to medical reasons. CPSS provided resources for residential treatment centers for [facility] in [place], [facility] in [employer], and [facility]. CPSS provided outpatient resources, AA meetings, and other residential substance use treatment centers across [facility]. CPSS also provided [facility] contact information and encouraged the patient to contact [facility]PSS for substance use recovery support.

## 2017-03-15 NOTE — Discharge Instructions (Signed)
For your behavioral health needs, you are advised to follow up with [facility].  Contact them at your earliest opportunity to ask about scheduling an intake appointment: ° °     [facility] °     [address] °     [place], [place] [address] °     [phone] °

## 2017-03-15 NOTE — ED Provider Notes (Addendum)
Medical screening examination/treatment/procedure(s) were conducted as a shared visit with non-physician practitioner(s) and myself.  I personally evaluated the patient during the encounter.   EKG Interpretation None     Patient has been sent to the emergency department for involuntary commitment based on family members reporting patient has become aggressive with paranoia and violent mood swings.  Patient denies she has complaints of such.  She has multiple medical comorbidities.  At my examination patient is resting quietly.  She is nontoxic.  She awakens to light voice.  Heart is regular no gross rub murmur gallop.  Lungs diffuse fine expiratory wheeze. Abdomen  soft and nontender.  No peripheral edema or calf tenderness.  Although patient has significant medical comorbidities and chronic underlying pain syndrome, at this time I feel she is stable for psychiatric evaluation.  Final disposition pending psych evaluation and evaluation of concerns of family precipitating IVC.   Arby Barrette, MD 03/15/17 Peterson Lombard, MD 03/15/17 [phone]

## 2017-03-19 ENCOUNTER — Emergency Department (HOSPITAL_COMMUNITY)
Admission: EM | Admit: 2017-03-19 | Discharge: 2017-03-19 | Disposition: A | Discharge status: Home / Self Care | Payer: 59 | Attending: Emergency Medicine | Admitting: Emergency Medicine

## 2017-03-19 ENCOUNTER — Encounter (HOSPITAL_COMMUNITY): Payer: Self-pay | Admitting: Emergency Medicine

## 2017-03-19 ENCOUNTER — Emergency Department (HOSPITAL_COMMUNITY): Payer: 59

## 2017-03-19 DIAGNOSIS — R51 Headache: Secondary | ICD-10-CM | POA: Insufficient documentation

## 2017-03-19 DIAGNOSIS — Z5321 Procedure and treatment not carried out due to patient leaving prior to being seen by health care provider: Secondary | ICD-10-CM | POA: Insufficient documentation

## 2017-03-19 DIAGNOSIS — F985 Adult onset fluency disorder: Secondary | ICD-10-CM | POA: Insufficient documentation

## 2017-03-19 NOTE — ED Triage Notes (Signed)
Brought by ems from home for c/o stuttering for two days and increase in incontinence.  Patient speaks clear without a stutter at time then will stutter over and over.

## 2017-03-19 NOTE — ED Notes (Signed)
Pt. Refuses to have her blood drawn and stated, You need to give me a sandwich or Im not leaving. Refuses to leave. Pt. Given a [place] sandwich and still refuses to leave. Security notified to escort out.

## 2017-03-19 NOTE — ED Notes (Signed)
Pt. Went to lobby and was escorted by security and police, she was refusing to leave the triage room after given a sandwich. Pt. Went to lobby and was calling EMS, hospital administration. I talked to the pt. About being seen. I stated we will be glad to see you but you need to cooperate with [place]. We gave you a sandwich which is what you were requesting.  Pt. Stated, I need to be seen right now. I explain to her about the wait, acuity and our patient placement. She stated, Anne Black been waiting and Im not waiting any longer. I stated to her, we are doing the best we can.  After conversation with security she decided to leave.

## 2017-03-23 ENCOUNTER — Other Ambulatory Visit: Payer: Self-pay | Admitting: Endocrinology

## 2017-03-25 ENCOUNTER — Emergency Department (HOSPITAL_COMMUNITY)
Admission: EM | Admit: 2017-03-25 | Discharge: 2017-03-25 | Disposition: A | Discharge status: Home / Self Care | Payer: 59 | Attending: Emergency Medicine | Admitting: Emergency Medicine

## 2017-03-25 ENCOUNTER — Encounter (HOSPITAL_COMMUNITY): Payer: Self-pay | Admitting: Emergency Medicine

## 2017-03-25 ENCOUNTER — Emergency Department (HOSPITAL_COMMUNITY): Payer: 59

## 2017-03-25 ENCOUNTER — Other Ambulatory Visit: Payer: Self-pay | Admitting: Internal Medicine

## 2017-03-25 DIAGNOSIS — R079 Chest pain, unspecified: Secondary | ICD-10-CM

## 2017-03-25 DIAGNOSIS — R51 Headache: Secondary | ICD-10-CM | POA: Diagnosis not present

## 2017-03-25 DIAGNOSIS — J449 Chronic obstructive pulmonary disease, unspecified: Secondary | ICD-10-CM | POA: Diagnosis not present

## 2017-03-25 DIAGNOSIS — L299 Pruritus, unspecified: Secondary | ICD-10-CM

## 2017-03-25 DIAGNOSIS — R1011 Right upper quadrant pain: Secondary | ICD-10-CM | POA: Diagnosis not present

## 2017-03-25 DIAGNOSIS — E119 Type 2 diabetes mellitus without complications: Secondary | ICD-10-CM | POA: Insufficient documentation

## 2017-03-25 DIAGNOSIS — R519 Headache, unspecified: Secondary | ICD-10-CM

## 2017-03-25 HISTORY — DX: Chronic obstructive pulmonary disease, unspecified: J44.9

## 2017-03-25 LAB — CBC WITH DIFFERENTIAL/PLATELET
BASOS ABS: 0 10*3/uL (ref 0.0–0.1)
Basophils Relative: 1 %
EOS PCT: 0 %
Eosinophils Absolute: 0 10*3/uL (ref 0.0–0.7)
HCT: 36.6 % (ref 36.0–46.0)
HEMOGLOBIN: 11.1 g/dL — AB (ref 12.0–15.0)
LYMPHS ABS: 0.7 10*3/uL (ref 0.7–4.0)
LYMPHS PCT: 11 %
MCH: 27.1 pg (ref 26.0–34.0)
MCHC: 30.3 g/dL (ref 30.0–36.0)
MCV: 89.3 fL (ref 78.0–100.0)
Monocytes Absolute: 0.4 10*3/uL (ref 0.1–1.0)
Monocytes Relative: 6 %
NEUTROS ABS: 5.3 10*3/uL (ref 1.7–7.7)
NEUTROS PCT: 82 %
PLATELETS: 158 10*3/uL (ref 150–400)
RBC: 4.1 MIL/uL (ref 3.87–5.11)
RDW: 16.4 % — ABNORMAL HIGH (ref 11.5–15.5)
WBC: 6.3 10*3/uL (ref 4.0–10.5)

## 2017-03-25 LAB — COMPREHENSIVE METABOLIC PANEL
ALK PHOS: 130 U/L — AB (ref 38–126)
ALT: 24 U/L (ref 14–54)
AST: 44 U/L — AB (ref 15–41)
Albumin: 3.5 g/dL (ref 3.5–5.0)
Anion gap: 10 (ref 5–15)
BUN: 6 mg/dL (ref 6–20)
CALCIUM: 9.1 mg/dL (ref 8.9–10.3)
CHLORIDE: 104 mmol/L (ref 101–111)
CO2: 24 mmol/L (ref 22–32)
CREATININE: 0.51 mg/dL (ref 0.44–1.00)
Glucose, Bld: 133 mg/dL — ABNORMAL HIGH (ref 65–99)
Potassium: 3.5 mmol/L (ref 3.5–5.1)
Sodium: 138 mmol/L (ref 135–145)
Total Bilirubin: 1.3 mg/dL — ABNORMAL HIGH (ref 0.3–1.2)
Total Protein: 6.7 g/dL (ref 6.5–8.1)

## 2017-03-25 LAB — I-STAT TROPONIN, ED: Troponin i, poc: 0 ng/mL (ref 0.00–0.08)

## 2017-03-25 LAB — LIPASE, BLOOD: Lipase: 26 U/L (ref 11–51)

## 2017-03-25 MED ORDER — THIAMINE HCL 100 MG/ML IJ SOLN
100.0000 mg | Freq: Every day | INTRAMUSCULAR | Status: DC
  Administered 2017-03-25: 100 mg via INTRAVENOUS
  Filled 2017-03-25: qty 2

## 2017-03-25 MED ORDER — VITAMIN B-1 100 MG PO TABS: 100.0000 mg | ORAL_TABLET | Freq: Every day | ORAL | Status: DC

## 2017-03-25 MED ORDER — LORAZEPAM 1 MG PO TABS: 0.0000 mg | ORAL_TABLET | Freq: Four times a day (QID) | ORAL | Status: DC

## 2017-03-25 MED ORDER — LORAZEPAM 2 MG/ML IJ SOLN: 0.0000 mg | Freq: Four times a day (QID) | INTRAMUSCULAR | Status: DC

## 2017-03-25 MED ORDER — LORAZEPAM 1 MG PO TABS: 0.0000 mg | ORAL_TABLET | Freq: Two times a day (BID) | ORAL | Status: DC

## 2017-03-25 MED ORDER — LORAZEPAM 2 MG/ML IJ SOLN: 0.0000 mg | Freq: Two times a day (BID) | INTRAMUSCULAR | Status: DC

## 2017-03-25 MED ORDER — METOCLOPRAMIDE HCL 5 MG/ML IJ SOLN
10.0000 mg | Freq: Once | INTRAMUSCULAR | Status: AC
  Administered 2017-03-25: 10 mg via INTRAVENOUS
  Filled 2017-03-25: qty 2

## 2017-03-25 MED ORDER — DIPHENHYDRAMINE HCL 50 MG/ML IJ SOLN
25.0000 mg | Freq: Once | INTRAMUSCULAR | Status: AC
  Administered 2017-03-25: 25 mg via INTRAVENOUS
  Filled 2017-03-25: qty 1

## 2017-03-25 MED ORDER — TRIAMCINOLONE ACETONIDE 0.1 % EX CREA: 1.0000 "application " | TOPICAL_CREAM | Freq: Two times a day (BID) | CUTANEOUS | 0 refills | Status: DC

## 2017-03-25 MED ORDER — LORAZEPAM 2 MG/ML IJ SOLN
1.0000 mg | Freq: Once | INTRAMUSCULAR | Status: AC
  Administered 2017-03-25: 1 mg via INTRAVENOUS
  Filled 2017-03-25: qty 1

## 2017-03-25 MED ORDER — CHLORDIAZEPOXIDE HCL 25 MG PO CAPS: ORAL_CAPSULE | ORAL | 0 refills | Status: DC

## 2017-03-25 MED ORDER — RIZATRIPTAN BENZOATE 10 MG PO TABS: 10.0000 mg | ORAL_TABLET | ORAL | 0 refills | Status: DC | PRN

## 2017-03-25 MED ORDER — ONDANSETRON 4 MG PO TBDP: 4.0000 mg | ORAL_TABLET | Freq: Three times a day (TID) | ORAL | 0 refills | Status: DC | PRN

## 2017-03-25 NOTE — ED Provider Notes (Signed)
[facility] EMERGENCY DEPARTMENT Provider Note   CSN: [id number] Arrival date & time: 03/25/17  1233     History   Chief Complaint Chief Complaint  Patient presents with  . Chest Pain    HPI Anne Black is a 57 y.o. female.  Patient with history of alcoholism, bipolar disorder, migraine headaches, recent hernia surgery with healing abdominal wound, COPD, MS, diabetes --presents to the emergency department today with multiple complaints.  Patient describes having several panic attacks last night where she had difficulty breathing.  After this time she describes developing a burning chest pain across her mid chest lasting now for more than 12 hours.  She has a sharp stabbing pain on the right lateral chest that is sore to the touch and pain in her right upper quadrant of her abdomen.  The burning pain radiates to her middle back.  She has had nausea and states that she vomited earlier today --however patient is requesting something to eat.  She denies any fever, cough.  She has had headache without neck pain or confusion.  She took Maxalt for this without much improvement.  Denies any vision changes or trouble with balance, weakness in arms or legs.  EMS was called earlier today and was given aspirin, nitroglycerin, and Zofran.  No other treatments prior to arrival. The onset of this condition was acute. The course is constant.   Patient also admits to alcohol use, almost 1 bottle of wine per day.  She does report feeling early withdrawals at this point with some tremors.       Past Medical History:  Diagnosis Date  . ADHD (attention deficit hyperactivity disorder)   . Anxiety   . Arthritis   . Ascites   . Bipolar disorder (HCC)   . Cirrhosis (HCC)   . COPD (chronic obstructive pulmonary disease) (HCC)   . Hypokalemia   . Migraine   . Multiple sclerosis (HCC)   . Osteoporosis     Patient Active Problem List   Diagnosis Date Noted  . Abdominal wall  cellulitis 12/30/2016  . S/P exploratory laparotomy 12/30/2016  . Distal radius fracture, left 09/09/2016  . Thrombocytopenia (HCC) 09/09/2016  . Chest x-ray abnormality   . Umbilical hernia without obstruction and without gangrene 05/12/2016  . Itching 02/24/2016  . Ventral hernia without obstruction or gangrene 02/24/2016  . Palliative care encounter   . Ascites   . Tachypnea   . Typical atrial flutter (HCC)   . Hypomagnesemia   . Hypoxia   . Dehydration 01/22/2016  . Low back ache 12/30/2015  . Non-intractable vomiting with nausea   . Alcohol abuse with alcohol-induced mood disorder (HCC) 12/15/2015  . Acute alcoholism (HCC)   . Abdominal distension 11/04/2015  . Alcohol abuse   . Dyspnea   . Acute respiratory failure (HCC) 10/15/2015  . Ascites due to alcoholic cirrhosis (HCC) 10/15/2015  . Osteoporosis   . Bipolar I disorder (HCC)   . Multiple sclerosis (HCC)   . Alcoholic cirrhosis of liver with ascites (HCC) 08/22/2015  . Bipolar disorder, current episode mixed, moderate (HCC) 08/01/2015  . Alcohol use disorder, severe, dependence (HCC) 07/31/2015  . Macrocytic anemia- due to alcohol abuse with normal B12 & folate levels 05/31/2015  . Severe protein-calorie malnutrition (HCC) 05/31/2015  . Hypokalemia 05/26/2015  . Encephalopathy, hepatic (HCC) 05/26/2015  . Alcohol withdrawal (HCC) 05/18/2015  . Abdominal pain 05/18/2015  . Anxiety 05/18/2015  . UTI (lower urinary tract infection) 05/18/2015  .  Stimulant abuse (HCC) 12/27/2014  . Nicotine abuse 12/27/2014  . Hyperprolactinemia (HCC) 11/15/2014  . Dyslipidemia   . Tobacco abuse 01/23/2014  . Noncompliance with therapeutic plan 04/04/2013  . Unspecified hereditary and idiopathic peripheral neuropathy 05/01/2012  . GERD (gastroesophageal reflux disease) 05/01/2012  . Osteoarthrosis, unspecified whether generalized or localized, involving lower leg 05/01/2012  . Hyponatremia 07/01/2011    Past Surgical History:    Procedure Laterality Date  . ABDOMINAL WALL DEFECT REPAIR N/A 12/30/2016   Procedure: EXPLORATORY LAPAROTOMY WITH REPAIR ABDOMINAL WALL VENTRAL HERNIA;  Surgeon: [name], MD;  Location: [facility] OR;  Service: General;  Laterality: N/A;  . APPLICATION OF WOUND VAC N/A 12/30/2016   Procedure: APPLICATION OF WOUND VAC;  Surgeon: [name], MD;  Location: [facility] OR;  Service: General;  Laterality: N/A;  . CESAREAN SECTION  [phone]  . FRACTURE SURGERY    . HERNIA REPAIR    . MYRINGOTOMY WITH TUBE PLACEMENT Bilateral   . ORIF WRIST FRACTURE Left 09/09/2016   Procedure: OPEN REDUCTION INTERNAL FIXATION (ORIF) LEFT WRIST FRACTURE, LEFT CARPAL TUNNEL RELEASE;  Surgeon: Dominica Severin, MD;  Location: [facility] ORS;  Service: Orthopedics;  Laterality: Left;  . TONSILLECTOMY      OB History    No data available       Home Medications    Prior to Admission medications   Medication Sig Start Date End Date Taking? Authorizing Provider  acetaminophen (TYLENOL) 500 MG tablet Take 1 tablet (500 mg total) by mouth every 6 (six) hours as needed for mild pain or headache. 03/11/17   Arrien, York Ram, MD  albuterol (PROVENTIL HFA;VENTOLIN HFA) 108 (90 Base) MCG/ACT inhaler Inhale 2 puffs into the lungs every 6 (six) hours as needed for wheezing or shortness of breath.    [provider]  allopurinol (ZYLOPRIM) 100 MG tablet Take 100 mg by mouth daily.    [provider]  amLODipine (NORVASC) 10 MG tablet Take 10 mg by mouth daily.    [provider]  aspirin EC 81 MG tablet Take 81 mg by mouth daily.    [provider]  busPIRone (BUSPAR) 15 MG tablet TAKE 1 TABLET(15 MG) BY MOUTH TWICE DAILY Patient taking differently: Take 15 mg by mouth two times a day 04/19/16   [name], MD  chlordiazePOXIDE (LIBRIUM) 25 MG capsule 50mg  PO TID x 1D, then 25-50mg  PO BID X 1D, then 25-50mg  PO QD X 1D 03/25/17   Renne Crigler, PA-C  cholecalciferol (VITAMIN D)  1000 units tablet Take 2,000 Units by mouth daily.    [provider]  donepezil (ARICEPT) 5 MG tablet Take 5 mg by mouth at bedtime.    [provider]  EPINEPHrine (EPIPEN 2-PAK) 0.3 mg/0.3 mL IJ SOAJ injection Inject 0.3 mg into the muscle once.    [provider]  finasteride (PROSCAR) 5 MG tablet Take 5 mg by mouth at bedtime.    [provider]  FLUoxetine (PROZAC) 20 MG capsule Take 20 mg by mouth daily.  03/30/16   [provider]  fluticasone (FLONASE) 50 MCG/ACT nasal spray Place 1 spray into both nostrils as needed for allergies or rhinitis.    [provider]  Fluticasone-Salmeterol (ADVAIR) 100-50 MCG/DOSE AEPB Inhale 1-2 puffs 2 (two) times daily into the lungs.  11/05/14   [provider]  furosemide (LASIX) 40 MG tablet Take 1 tablet (40 mg total) by mouth daily. 06/17/16   Midge Minium, MD  gabapentin (NEURONTIN) 300 MG capsule  Take 300 mg by mouth 3 (three) times daily.    [provider]  gabapentin (NEURONTIN) 400 MG capsule Take 1 capsule (400 mg total) 3 (three) times daily by mouth. Patient not taking: Reported on 03/14/2017 12/26/16   Charm Rings, NP  hydrocortisone 2.5 % cream Apply 1 application topically 3 (three) times daily. 03/11/17 04/10/17  Arrien, York Ram, MD  ipratropium-albuterol (DUONEB) 0.5-2.5 (3) MG/3ML SOLN Take 3 mLs by nebulization every 6 (six) hours as needed. 03/11/17 04/10/17  Arrien, York Ram, MD  lisinopril (PRINIVIL,ZESTRIL) 10 MG tablet Take 10 mg by mouth at bedtime.    [provider]  Melatonin 3 MG TABS Take 1 tablet (3 mg total) by mouth at bedtime as needed (sleep). Patient taking differently: Take 3 mg by mouth at bedtime.  10/24/15   Garth Bigness, MD  memantine (NAMENDA) 10 MG tablet Take 10 mg by mouth 2 (two) times daily.    [provider]  metFORMIN (GLUCOPHAGE) 500 MG tablet Take 500 mg by mouth daily with breakfast.    [provider]  modafinil (PROVIGIL) 200 MG tablet Take 200 mg by mouth daily. 08/04/16   [provider]  nicotine (NICODERM CQ - DOSED IN MG/24 HOURS) 21 mg/24hr patch Place 21 mg onto the skin daily. 08/04/15   [provider]  ondansetron (ZOFRAN ODT) 4 MG disintegrating tablet Take 1 tablet (4 mg total) by mouth every 8 (eight) hours as needed for nausea or vomiting. 03/25/17   Renne Crigler, PA-C  ondansetron (ZOFRAN) 4 MG tablet TAKE 1 TABLET(4 MG) BY MOUTH EVERY 8 HOURS AS NEEDED FOR NAUSEA OR VOMITING 03/12/17   Collie Siad A, MD  ondansetron (ZOFRAN) 8 MG tablet Take 8 mg by mouth every 8 (eight) hours as needed for nausea or vomiting.  03/12/17   [provider]  pantoprazole (PROTONIX) 40 MG tablet Take 1 tablet (40 mg total) by mouth daily. 12/30/15   Haney, Arlyss Repress A, MD  potassium chloride 20 MEQ/15ML (10%) SOLN Take 30 mLs (40 mEq total) by mouth 2 (two) times daily. Patient not taking: Reported on 03/14/2017 01/10/17   Marinda Elk, MD  rifaximin (XIFAXAN) 550 MG TABS tablet Take 1 tablet (550 mg total) by mouth 2 (two) times daily. Patient not taking: Reported on 01/17/2017 06/05/15   Catarina Hartshorn, MD  risperiDONE (RISPERDAL) 0.5 MG tablet Take 1 tablet (0.5 mg total) by mouth 3 (three) times daily as needed (agitation). Patient taking differently: Take 0.5 mg by mouth 2 (two) times daily.  02/06/16   Renne Musca, MD  rizatriptan (MAXALT) 10 MG tablet Take 1 tablet (10 mg total) by mouth as needed for migraine. May repeat in 2 hours if needed 03/25/17   Renne Crigler, PA-C  sertraline (ZOLOFT) 50 MG tablet Take 50 mg by mouth daily.    [provider]  spironolactone (ALDACTONE) 100 MG tablet Take 1 tablet (100 mg total) by mouth daily. 11/23/16   Derwood Kaplan, MD  topiramate (TOPAMAX) 100 MG tablet Take 1 tablet (100 mg total) by mouth daily. 01/07/16   [name], MD  triamcinolone cream (KENALOG) 0.1 % Apply 1 application  topically 2 (two) times daily. 03/25/17   Renne Crigler, PA-C    Family History Family History  Problem Relation Age of Onset  . Arrhythmia Mother   . Heart disease Father   . Hypertension Father     Social History Social History   Tobacco Use  . Smoking  status: Current Every Day Smoker    Packs/day: 1.00    Years: 20.00    Pack years: 20.00    Types: Cigarettes  . Smokeless tobacco: Never Used  . Tobacco comment: Currently Vaping Daily  Substance Use Topics  . Alcohol use: Yes    Alcohol/week: 7.2 oz    Types: 12 Glasses of wine per week  . Drug use: No     Allergies   Patient has no known allergies.   Review of Systems Review of Systems  Constitutional: Positive for fatigue. Negative for appetite change, diaphoresis and fever.  HENT: Negative for congestion, dental problem, rhinorrhea and sinus pressure.   Eyes: Negative for photophobia, discharge, redness and visual disturbance.  Respiratory: Negative for cough and shortness of breath.   Cardiovascular: Positive for chest pain. Negative for palpitations and leg swelling.  Gastrointestinal: Positive for abdominal pain, nausea and vomiting.  Genitourinary: Negative for dysuria.  Musculoskeletal: Positive for back pain. Negative for gait problem, neck pain and neck stiffness.  Skin: Negative for rash.  Neurological: Positive for headaches. Negative for syncope, speech difficulty, weakness, light-headedness and numbness.  Psychiatric/Behavioral: Negative for confusion. The patient is nervous/anxious.      Physical Exam Updated Vital Signs BP 106/69   Pulse 78   Temp 98.8 F (37.1 C) (Oral)   Resp 15   Ht 5\' 2"  (1.575 m)   Wt 72.6 kg (160 lb)   SpO2 93%   BMI 29.26 kg/m   Physical Exam  Constitutional: She is oriented to person, place, and time. She appears well-developed and well-nourished.  HENT:  Head: Normocephalic and atraumatic.  Right Ear: Tympanic membrane, external ear and ear canal normal.   Left Ear: Tympanic membrane, external ear and ear canal normal.  Nose: Nose normal.  Mouth/Throat: Uvula is midline, oropharynx is clear and moist and mucous membranes are normal. Mucous membranes are not dry.  Eyes: Conjunctivae, EOM and lids are normal. Pupils are equal, round, and reactive to light. Right eye exhibits no discharge. Left eye exhibits no discharge. Right eye exhibits no nystagmus. Left eye exhibits no nystagmus.  Neck: Trachea normal and normal range of motion. Neck supple. Normal carotid pulses and no JVD present. No muscular tenderness present. Carotid bruit is not present. No tracheal deviation present.  No meningeal signs.  Cardiovascular: Normal rate, regular rhythm, S1 normal, S2 normal, normal heart sounds and intact distal pulses. Exam reveals no decreased pulses.  No murmur heard. Pulmonary/Chest: Effort normal and breath sounds normal. No respiratory distress. She has no wheezes. She has no rales. She exhibits tenderness (soreness in the right axilla).  Abdominal: Soft. Normal aorta and bowel sounds are normal. There is tenderness (Mild RUQ tenderness). There is no rebound and no guarding.  Healing midline abdominal wound noted  Musculoskeletal: Normal range of motion. She exhibits no edema or tenderness.       Cervical back: She exhibits normal range of motion, no tenderness and no bony tenderness.  Neurological: She is alert and oriented to person, place, and time. She has normal strength and normal reflexes. No cranial nerve deficit or sensory deficit. She displays a negative Romberg sign. Coordination and gait normal. GCS eye subscore is 4. GCS verbal subscore is 5. GCS motor subscore is 6.  Skin: Skin is warm and dry. She is not diaphoretic. No cyanosis. No pallor.  Psychiatric: She has a normal mood and affect.  Nursing note and vitals reviewed.    ED Treatments / Results  Labs (  all labs ordered are listed, but only abnormal results are displayed) Labs  Reviewed  CBC WITH DIFFERENTIAL/PLATELET - Abnormal; Notable for the following components:      Result Value   Hemoglobin 11.1 (*)    RDW 16.4 (*)    All other components within normal limits  COMPREHENSIVE METABOLIC PANEL - Abnormal; Notable for the following components:   Glucose, Bld 133 (*)    AST 44 (*)    Alkaline Phosphatase 130 (*)    Total Bilirubin 1.3 (*)    All other components within normal limits  LIPASE, BLOOD  I-STAT TROPONIN, ED    EKG  EKG Interpretation  Date/Time:  Friday March 25 2017 12:37:59 EST Ventricular Rate:  83 PR Interval:    QRS Duration: 90 QT Interval:  406 QTC Calculation: 478 R Axis:   74 Text Interpretation:  Sinus rhythm Nonspecific T abnrm, anterolateral leads no sig change as compared to previous Confirmed by Arby Barrette [phone]) on 03/25/2017 2:19:12 PM       Radiology US Abdomen Limited Ruq  Result Date: 03/25/2017 CLINICAL DATA:  Right upper quadrant pain EXAM: ULTRASOUND ABDOMEN LIMITED RIGHT UPPER QUADRANT COMPARISON:  CT abdomen and pelvis December 21, 2016 FINDINGS: Gallbladder: No gallstones or wall thickening visualized. There is no pericholecystic fluid. No sonographic Murphy sign noted by sonographer. Common bile duct: Diameter: 2 mm. No intrahepatic or extrahepatic biliary duct dilatation. Liver: No focal lesion identified. Liver echogenicity overall is increased. Portal vein is patent on color Doppler imaging with normal direction of blood flow towards the liver. IMPRESSION: Increase in liver echogenicity, a finding indicative of hepatic steatosis with potential underlying parenchymal liver disease. While no focal liver lesions are evident on this study, it must be cautioned that the sensitivity of ultrasound for detection of focal liver lesions is diminished in this circumstance. Study otherwise unremarkable. Electronically Signed   By: Bretta Bang III M.D.   On: 03/25/2017 15:01    Procedures Procedures (including  critical care time)  Medications Ordered in ED Medications  LORazepam (ATIVAN) injection 0-4 mg (not administered)    Or  LORazepam (ATIVAN) tablet 0-4 mg (not administered)  LORazepam (ATIVAN) injection 0-4 mg (not administered)    Or  LORazepam (ATIVAN) tablet 0-4 mg (not administered)  thiamine (VITAMIN B-1) tablet 100 mg ( Oral See Alternative 03/25/17 1432)    Or  thiamine (B-1) injection 100 mg (100 mg Intravenous Given 03/25/17 1432)  LORazepam (ATIVAN) injection 1 mg (1 mg Intravenous Given 03/25/17 1435)  metoCLOPramide (REGLAN) injection 10 mg (10 mg Intravenous Given 03/25/17 1430)  diphenhydrAMINE (BENADRYL) injection 25 mg (25 mg Intravenous Given 03/25/17 1434)     Initial Impression / Assessment and Plan / ED Course  I have reviewed the triage vital signs and the nursing notes.  Pertinent labs & imaging results that were available during my care of the patient were reviewed by me and considered in my medical decision making (see chart for details).     Patient seen and examined. Work-up initiated. Medications ordered.  Will treat headache with migraine cocktail.  Given right upper quadrant pain, will evaluate for gallbladder disease.  Vital signs reviewed and are as follows: BP 106/69   Pulse 78   Temp 98.8 F (37.1 C) (Oral)   Resp 15   Ht 5\' 2"  (1.575 m)   Wt 72.6 kg (160 lb)   SpO2 93%   BMI 29.26 kg/m   Lab work overall is reassuring.  Mild elevation in  AST, likely due to chronic alcohol use.  Patient was also given Ativan to help her withdrawal symptoms.  No indications for admission at this point.  Troponin negative and EKG without changes.  Patient discussed and seen by Dr. Donnald Garre.  Agrees with workup and no indication for admission at this time.  The patient was urged to return to the Emergency Department immediately with worsening of current symptoms, worsening abdominal pain, persistent vomiting, blood noted in stools, fever, or any other concerns.  The patient verbalized understanding.   Patient requests Librium, Zofran to treat her symptoms at home.  She states that she desires to stop drinking.  Request Maxalt for headache.  Blood pressure is controlled.  Also requests topical steroid cream as these have been helpful for some skin lesions.  I strongly encourage patient to follow-up with her primary care physician regarding her numerous complaints and also to discuss alcohol cessation.  Encouraged return to the emergency department with worsening chest pain or shortness of breath or abdominal symptoms as discussed above.   Final Clinical Impressions(s) / ED Diagnoses   Final diagnoses:  RUQ pain  Chest pain, unspecified type  Acute nonintractable headache, unspecified headache type   Chest pain: Atypical features, nonexertional, no diaphoresis or vomiting.  Troponin negative x1.  EKG without changes.  Patient does not have an extensive risk factor profile.  Do not feel that she requires admission for chest pain evaluation at this point.  Symptoms are much more likely GI related due to excessive alcohol use.  Right upper quadrant pain/abdominal pain: Right upper quadrant ultrasound is negative.  Patient with mild elevation AST likely related to alcohol use.  Lipase is normal.  Abdomen is soft without signs of peritonitis.  Suspect GERD/PUD/alcoholic gastritis given workup and findings today.  Headache: Typical migraine for patient.  No signs of meningitis, confusion, or neurologic deficit.  Patient appears well and appropriate.  Patient without high-risk features of headache including: sudden onset/thunderclap HA, no similar headache in past, altered mental status, accompanying seizure, headache with exertion, history of immunocompromise, neck or shoulder pain, fever, use of anticoagulation, family history of spontaneous SAH, concomitant drug use, toxic exposure.   Patient has a normal complete neurological exam, normal vital signs,  normal level of consciousness, no signs of meningismus, is well-appearing/non-toxic appearing, no signs of trauma.  Imaging with CT/MRI not indicated given history and physical exam findings.   No dangerous or life-threatening conditions suspected or identified by history, physical exam, and by work-up. No indications for hospitalization identified.     ED Discharge Orders        Ordered    chlordiazePOXIDE (LIBRIUM) 25 MG capsule     03/25/17 1624    ondansetron (ZOFRAN ODT) 4 MG disintegrating tablet  Every 8 hours PRN     03/25/17 1624    rizatriptan (MAXALT) 10 MG tablet  As needed     03/25/17 1624    triamcinolone cream (KENALOG) 0.1 %  2 times daily     03/25/17 1627       Renne Crigler, PA-C 03/25/17 1710    Arby Barrette, MD 03/25/17 1801

## 2017-03-25 NOTE — ED Provider Notes (Signed)
Medical screening examination/treatment/procedure(s) were conducted as a shared visit with non-physician practitioner(s) and myself.  I personally evaluated the patient during the encounter.   EKG Interpretation  Date/Time:  Friday March 25 2017 12:37:59 EST Ventricular Rate:  83 PR Interval:    QRS Duration: 90 QT Interval:  406 QTC Calculation: 478 R Axis:   74 Text Interpretation:  Sinus rhythm Nonspecific T abnrm, anterolateral leads no sig change as compared to previous Confirmed by Arby Barrette [phone]) on 03/25/2017 2:19:12 PM     Has complex medical history including alcoholism, bipolar disorder COPD diabetes and chronic pain.  She has multiple complaints including nausea, panic attacks burning chest pain, epigastric pain.  No fever.  As I enter the room patient is resting quietly with no signs of distress.  Vital signs are normal.  Heart is regular no murmur gallop.  Lungs are clear.  Abdomen is soft.  She has healing central abdominal wound that is chronic in appearance.  No significant peripheral edema.  Movements are coordinated and purposeful.  Patient report nausea however she also asked me if she can get a [place] sandwich and some diet ginger ale.  Diagnostic workup was unremarkable.  At this time I feel she is stable to continue outpatient management.  Patient has heavy alcohol use and chronic ongoing risk of medical comorbidities.   Arby Barrette, MD 03/25/17 [phone]

## 2017-03-25 NOTE — ED Notes (Signed)
Patient transported to Ultrasound 

## 2017-03-25 NOTE — ED Triage Notes (Signed)
Per [facility] patient coming from home complaining of  Right and left chest pain that began last night. Patient describes it as a burning pain that radiates into her back. Reports nausea, weakness, headache, and dizziness. Vomited x  this morning. Ems administered 324 aspirin, 0.5 nitroglycerin, and 4 mg zofran

## 2017-03-25 NOTE — Discharge Instructions (Signed)
Please read and follow all provided instructions.  Your diagnoses today include:  1. RUQ pain   2. Chest pain, unspecified type   3. Acute nonintractable headache, unspecified headache type     Tests performed today include:  An EKG of your heart  A chest x-ray  Cardiac enzymes - a blood test for heart muscle damage  Blood counts and electrolytes  Ultrasound of your gallbladder -no gallbladder problems  Vital signs. See below for your results today.   Medications prescribed:   None  Take any prescribed medications only as directed.  Follow-up instructions: Please follow-up with your primary care provider as soon as you can for further evaluation of your symptoms.   Return instructions:  SEEK IMMEDIATE MEDICAL ATTENTION IF:  You have severe chest pain, especially if the pain is crushing or pressure-like and spreads to the arms, back, neck, or jaw, or if you have sweating, nausea (feeling sick to your stomach), or shortness of breath. THIS IS AN EMERGENCY. Don't wait to see if the pain will go away. Get medical help at once. Call 911 or [phone] (operator). DO NOT drive yourself to the hospital.   Your chest pain gets worse and does not go away with rest.   You have an attack of chest pain lasting longer than usual, despite rest and treatment with the medications your caregiver has prescribed.   You wake from sleep with chest pain or shortness of breath.  You feel dizzy or faint.  You have chest pain not typical of your usual pain for which you originally saw your caregiver.   You have any other emergent concerns regarding your health.  Additional Information: Chest pain comes from many different causes. Your caregiver has diagnosed you as having chest pain that is not specific for one problem, but does not require admission.  You are at low risk for an acute heart condition or other serious illness.   Your vital signs today were: BP 126/71    Pulse 86    Temp 98.8 F (37.1  C) (Oral)    Resp 14    Ht 5\' 2"  (1.575 m)    Wt 72.6 kg (160 lb)    SpO2 95%    BMI 29.26 kg/m  If your blood pressure (BP) was elevated above 135/85 this visit, please have this repeated by your doctor within one month. --------------

## 2017-04-01 ENCOUNTER — Emergency Department (HOSPITAL_COMMUNITY)
Admission: EM | Admit: 2017-04-01 | Discharge: 2017-04-02 | Disposition: A | Discharge status: Home / Self Care | Payer: No Typology Code available for payment source | Attending: Emergency Medicine | Admitting: Emergency Medicine

## 2017-04-01 ENCOUNTER — Emergency Department (HOSPITAL_COMMUNITY)
Admission: EM | Admit: 2017-04-01 | Discharge: 2017-04-01 | Disposition: A | Discharge status: Home / Self Care | Payer: No Typology Code available for payment source | Attending: Emergency Medicine | Admitting: Emergency Medicine

## 2017-04-01 ENCOUNTER — Other Ambulatory Visit: Payer: Self-pay

## 2017-04-01 ENCOUNTER — Encounter (HOSPITAL_COMMUNITY): Payer: Self-pay | Admitting: Emergency Medicine

## 2017-04-01 ENCOUNTER — Emergency Department (HOSPITAL_COMMUNITY): Payer: No Typology Code available for payment source

## 2017-04-01 DIAGNOSIS — Z5321 Procedure and treatment not carried out due to patient leaving prior to being seen by health care provider: Secondary | ICD-10-CM | POA: Insufficient documentation

## 2017-04-01 DIAGNOSIS — F10230 Alcohol dependence with withdrawal, uncomplicated: Secondary | ICD-10-CM | POA: Insufficient documentation

## 2017-04-01 DIAGNOSIS — R079 Chest pain, unspecified: Secondary | ICD-10-CM | POA: Diagnosis not present

## 2017-04-01 DIAGNOSIS — J189 Pneumonia, unspecified organism: Secondary | ICD-10-CM | POA: Insufficient documentation

## 2017-04-01 NOTE — ED Triage Notes (Signed)
Per EMS pt getting over pneumonia; right chest pain with cough, breathing, and movement. ETOH on board. Pt thinks she has bed bug but EMS did not visualize.

## 2017-04-01 NOTE — ED Notes (Addendum)
Called patient to be triage and no answer. Patient was triage around 7pm and patient refused treatment. Patient asked to be discharged. Patient was discharged and patient came back for alcohol withdrawal. Patient was in the lobby saying she had bed bugs, trying to fall out of chair. Patient was doing all this because she wanted a bed to lay in. Patient called on the phone at home and spoke to [employer]

## 2017-04-01 NOTE — ED Notes (Signed)
Patient refused treatment. Will not let staff get blood or xray to get xray. Patient wants a bed. Patient vital signs stable. Patient states she wants to be discharged.

## 2017-04-01 NOTE — ED Notes (Signed)
No answer for blood draw @ this time. 

## 2017-04-15 ENCOUNTER — Emergency Department (HOSPITAL_COMMUNITY): Payer: No Typology Code available for payment source

## 2017-04-15 ENCOUNTER — Encounter (HOSPITAL_COMMUNITY): Payer: Self-pay

## 2017-04-15 ENCOUNTER — Other Ambulatory Visit: Payer: Self-pay

## 2017-04-15 ENCOUNTER — Inpatient Hospital Stay (HOSPITAL_COMMUNITY)
Admission: EM | Admit: 2017-04-15 | Discharge: 2017-04-21 | DRG: 184 | Disposition: A | Discharge status: Skilled Nursing Facility | Payer: No Typology Code available for payment source | Attending: Internal Medicine | Admitting: Internal Medicine

## 2017-04-15 DIAGNOSIS — F909 Attention-deficit hyperactivity disorder, unspecified type: Secondary | ICD-10-CM | POA: Diagnosis present

## 2017-04-15 DIAGNOSIS — Y908 Blood alcohol level of 240 mg/100 ml or more: Secondary | ICD-10-CM | POA: Diagnosis present

## 2017-04-15 DIAGNOSIS — Z79899 Other long term (current) drug therapy: Secondary | ICD-10-CM

## 2017-04-15 DIAGNOSIS — F10230 Alcohol dependence with withdrawal, uncomplicated: Secondary | ICD-10-CM | POA: Diagnosis not present

## 2017-04-15 DIAGNOSIS — S2231XA Fracture of one rib, right side, initial encounter for closed fracture: Secondary | ICD-10-CM | POA: Diagnosis present

## 2017-04-15 DIAGNOSIS — Z720 Tobacco use: Secondary | ICD-10-CM | POA: Diagnosis not present

## 2017-04-15 DIAGNOSIS — W1830XA Fall on same level, unspecified, initial encounter: Secondary | ICD-10-CM | POA: Diagnosis present

## 2017-04-15 DIAGNOSIS — M810 Age-related osteoporosis without current pathological fracture: Secondary | ICD-10-CM | POA: Diagnosis present

## 2017-04-15 DIAGNOSIS — G35 Multiple sclerosis: Secondary | ICD-10-CM | POA: Diagnosis present

## 2017-04-15 DIAGNOSIS — K219 Gastro-esophageal reflux disease without esophagitis: Secondary | ICD-10-CM | POA: Diagnosis present

## 2017-04-15 DIAGNOSIS — Y92009 Unspecified place in unspecified non-institutional (private) residence as the place of occurrence of the external cause: Secondary | ICD-10-CM

## 2017-04-15 DIAGNOSIS — E876 Hypokalemia: Secondary | ICD-10-CM | POA: Diagnosis present

## 2017-04-15 DIAGNOSIS — R451 Restlessness and agitation: Secondary | ICD-10-CM | POA: Diagnosis present

## 2017-04-15 DIAGNOSIS — F319 Bipolar disorder, unspecified: Secondary | ICD-10-CM | POA: Diagnosis present

## 2017-04-15 DIAGNOSIS — F1020 Alcohol dependence, uncomplicated: Secondary | ICD-10-CM | POA: Diagnosis present

## 2017-04-15 DIAGNOSIS — D649 Anemia, unspecified: Secondary | ICD-10-CM | POA: Diagnosis present

## 2017-04-15 DIAGNOSIS — F10231 Alcohol dependence with withdrawal delirium: Secondary | ICD-10-CM | POA: Diagnosis not present

## 2017-04-15 DIAGNOSIS — S2241XA Multiple fractures of ribs, right side, initial encounter for closed fracture: Principal | ICD-10-CM | POA: Diagnosis present

## 2017-04-15 DIAGNOSIS — J449 Chronic obstructive pulmonary disease, unspecified: Secondary | ICD-10-CM

## 2017-04-15 DIAGNOSIS — F10920 Alcohol use, unspecified with intoxication, uncomplicated: Secondary | ICD-10-CM

## 2017-04-15 DIAGNOSIS — Z09 Encounter for follow-up examination after completed treatment for conditions other than malignant neoplasm: Secondary | ICD-10-CM

## 2017-04-15 DIAGNOSIS — K7031 Alcoholic cirrhosis of liver with ascites: Secondary | ICD-10-CM | POA: Diagnosis present

## 2017-04-15 DIAGNOSIS — F10239 Alcohol dependence with withdrawal, unspecified: Secondary | ICD-10-CM | POA: Diagnosis present

## 2017-04-15 DIAGNOSIS — D696 Thrombocytopenia, unspecified: Secondary | ICD-10-CM | POA: Diagnosis present

## 2017-04-15 DIAGNOSIS — F10120 Alcohol abuse with intoxication, uncomplicated: Secondary | ICD-10-CM | POA: Diagnosis present

## 2017-04-15 DIAGNOSIS — F10220 Alcohol dependence with intoxication, uncomplicated: Secondary | ICD-10-CM | POA: Diagnosis present

## 2017-04-15 DIAGNOSIS — F419 Anxiety disorder, unspecified: Secondary | ICD-10-CM | POA: Diagnosis present

## 2017-04-15 DIAGNOSIS — F10939 Alcohol use, unspecified with withdrawal, unspecified: Secondary | ICD-10-CM | POA: Diagnosis present

## 2017-04-15 DIAGNOSIS — F17210 Nicotine dependence, cigarettes, uncomplicated: Secondary | ICD-10-CM | POA: Diagnosis present

## 2017-04-15 DIAGNOSIS — R0902 Hypoxemia: Secondary | ICD-10-CM | POA: Diagnosis not present

## 2017-04-15 LAB — CBC
HEMATOCRIT: 32.6 % — AB (ref 36.0–46.0)
HEMOGLOBIN: 10 g/dL — AB (ref 12.0–15.0)
MCH: 27.5 pg (ref 26.0–34.0)
MCHC: 30.7 g/dL (ref 30.0–36.0)
MCV: 89.6 fL (ref 78.0–100.0)
Platelets: 154 10*3/uL (ref 150–400)
RBC: 3.64 MIL/uL — AB (ref 3.87–5.11)
RDW: 18 % — ABNORMAL HIGH (ref 11.5–15.5)
WBC: 4.4 10*3/uL (ref 4.0–10.5)

## 2017-04-15 LAB — PHOSPHORUS: Phosphorus: 4.4 mg/dL (ref 2.5–4.6)

## 2017-04-15 LAB — BASIC METABOLIC PANEL
ANION GAP: 10 (ref 5–15)
BUN: 9 mg/dL (ref 6–20)
CHLORIDE: 96 mmol/L — AB (ref 101–111)
CO2: 35 mmol/L — AB (ref 22–32)
CREATININE: 0.52 mg/dL (ref 0.44–1.00)
Calcium: 8.3 mg/dL — ABNORMAL LOW (ref 8.9–10.3)
GFR calc non Af Amer: >60 mL/min (ref >60)
Glucose, Bld: 120 mg/dL — ABNORMAL HIGH (ref 65–99)
Potassium: 3 mmol/L — ABNORMAL LOW (ref 3.5–5.1)
SODIUM: 141 mmol/L (ref 135–145)

## 2017-04-15 LAB — ETHANOL: ALCOHOL ETHYL (B): 264 mg/dL — AB (ref <10)

## 2017-04-15 LAB — MAGNESIUM: Magnesium: 1.8 mg/dL (ref 1.7–2.4)

## 2017-04-15 MED ORDER — POTASSIUM CHLORIDE CRYS ER 20 MEQ PO TBCR
20.0000 meq | EXTENDED_RELEASE_TABLET | Freq: Every day | ORAL | Status: DC
  Administered 2017-04-15 – 2017-04-19 (×5): 20 meq via ORAL
  Filled 2017-04-15 (×4): qty 1

## 2017-04-15 MED ORDER — ENOXAPARIN SODIUM 40 MG/0.4ML ~~LOC~~ SOLN
40.0000 mg | SUBCUTANEOUS | Status: DC
  Administered 2017-04-15 – 2017-04-21 (×7): 40 mg via SUBCUTANEOUS
  Filled 2017-04-15 (×7): qty 0.4

## 2017-04-15 MED ORDER — FOLIC ACID 1 MG PO TABS
1.0000 mg | ORAL_TABLET | Freq: Every day | ORAL | Status: DC
  Administered 2017-04-15 – 2017-04-21 (×7): 1 mg via ORAL
  Filled 2017-04-15 (×7): qty 1

## 2017-04-15 MED ORDER — NICOTINE 21 MG/24HR TD PT24
21.0000 mg | MEDICATED_PATCH | Freq: Every day | TRANSDERMAL | Status: DC
  Administered 2017-04-15 – 2017-04-21 (×7): 21 mg via TRANSDERMAL
  Filled 2017-04-15 (×7): qty 1

## 2017-04-15 MED ORDER — SPIRONOLACTONE 25 MG PO TABS
100.0000 mg | ORAL_TABLET | Freq: Every day | ORAL | Status: DC
  Administered 2017-04-15 – 2017-04-21 (×7): 100 mg via ORAL
  Filled 2017-04-15 (×7): qty 4

## 2017-04-15 MED ORDER — PANTOPRAZOLE SODIUM 40 MG PO TBEC
40.0000 mg | DELAYED_RELEASE_TABLET | Freq: Every day | ORAL | Status: AC
  Administered 2017-04-15 – 2017-04-17 (×3): 40 mg via ORAL
  Filled 2017-04-15 (×3): qty 1

## 2017-04-15 MED ORDER — VITAMIN B-1 100 MG PO TABS
100.0000 mg | ORAL_TABLET | Freq: Every day | ORAL | Status: DC
  Administered 2017-04-16 – 2017-04-21 (×6): 100 mg via ORAL
  Filled 2017-04-15 (×6): qty 1

## 2017-04-15 MED ORDER — AMLODIPINE BESYLATE 10 MG PO TABS
10.0000 mg | ORAL_TABLET | Freq: Every day | ORAL | Status: DC
  Administered 2017-04-15 – 2017-04-21 (×6): 10 mg via ORAL
  Filled 2017-04-15 (×7): qty 1

## 2017-04-15 MED ORDER — FLUTICASONE PROPIONATE 50 MCG/ACT NA SUSP: 1.0000 | Freq: Every day | NASAL | Status: DC | PRN

## 2017-04-15 MED ORDER — IPRATROPIUM-ALBUTEROL 0.5-2.5 (3) MG/3ML IN SOLN
3.0000 mL | Freq: Four times a day (QID) | RESPIRATORY_TRACT | Status: DC | PRN
Start: 2017-04-15 — End: 2017-04-21
  Administered 2017-04-15 – 2017-04-18 (×2): 3 mL via RESPIRATORY_TRACT
  Filled 2017-04-15 (×2): qty 3

## 2017-04-15 MED ORDER — POTASSIUM CHLORIDE 10 MEQ/100ML IV SOLN
10.0000 meq | INTRAVENOUS | Status: AC
  Administered 2017-04-15: 10 meq via INTRAVENOUS
  Filled 2017-04-15: qty 100

## 2017-04-15 MED ORDER — ONDANSETRON HCL 4 MG/2ML IJ SOLN
4.0000 mg | Freq: Four times a day (QID) | INTRAMUSCULAR | Status: AC | PRN
  Administered 2017-04-15 – 2017-04-20 (×3): 4 mg via INTRAVENOUS
  Filled 2017-04-15 (×3): qty 2

## 2017-04-15 MED ORDER — THIAMINE HCL 100 MG/ML IJ SOLN
100.0000 mg | Freq: Every day | INTRAMUSCULAR | Status: DC
  Administered 2017-04-15: 100 mg via INTRAVENOUS
  Filled 2017-04-15 (×2): qty 2

## 2017-04-15 MED ORDER — ALBUTEROL SULFATE HFA 108 (90 BASE) MCG/ACT IN AERS: 2.0000 | INHALATION_SPRAY | Freq: Four times a day (QID) | RESPIRATORY_TRACT | Status: DC | PRN

## 2017-04-15 MED ORDER — HYDROCODONE-ACETAMINOPHEN 5-325 MG PO TABS
1.0000 | ORAL_TABLET | Freq: Once | ORAL | Status: AC
  Administered 2017-04-15: 1 via ORAL
  Filled 2017-04-15: qty 1

## 2017-04-15 MED ORDER — LORAZEPAM 2 MG/ML IJ SOLN
2.0000 mg | INTRAMUSCULAR | Status: AC | PRN
  Administered 2017-04-15 – 2017-04-17 (×6): 2 mg via INTRAVENOUS
  Filled 2017-04-15 (×5): qty 1

## 2017-04-15 MED ORDER — FLUOXETINE HCL 20 MG PO CAPS
20.0000 mg | ORAL_CAPSULE | Freq: Every day | ORAL | Status: DC
  Administered 2017-04-15 – 2017-04-21 (×7): 20 mg via ORAL
  Filled 2017-04-15 (×7): qty 1

## 2017-04-15 MED ORDER — LORAZEPAM 1 MG PO TABS
0.0000 mg | ORAL_TABLET | Freq: Four times a day (QID) | ORAL | Status: AC
  Administered 2017-04-15: 1 mg via ORAL
  Administered 2017-04-15 – 2017-04-16 (×2): 2 mg via ORAL
  Filled 2017-04-15 (×3): qty 2

## 2017-04-15 MED ORDER — OXYCODONE HCL 5 MG PO TABS
5.0000 mg | ORAL_TABLET | Freq: Four times a day (QID) | ORAL | Status: DC | PRN
  Administered 2017-04-15 – 2017-04-21 (×17): 5 mg via ORAL
  Filled 2017-04-15 (×17): qty 1

## 2017-04-15 MED ORDER — KETOROLAC TROMETHAMINE 30 MG/ML IJ SOLN
30.0000 mg | Freq: Once | INTRAMUSCULAR | Status: AC
  Administered 2017-04-15: 30 mg via INTRAMUSCULAR
  Filled 2017-04-15: qty 1

## 2017-04-15 MED ORDER — LIDOCAINE 5 % EX PTCH
1.0000 | MEDICATED_PATCH | CUTANEOUS | Status: DC
  Administered 2017-04-15 – 2017-04-21 (×7): 1 via TRANSDERMAL
  Filled 2017-04-15 (×8): qty 1

## 2017-04-15 MED ORDER — LORAZEPAM 2 MG/ML IJ SOLN
0.0000 mg | Freq: Four times a day (QID) | INTRAMUSCULAR | Status: AC
  Administered 2017-04-15: 2 mg via INTRAVENOUS
  Administered 2017-04-16: 4 mg via INTRAVENOUS
  Administered 2017-04-16 (×2): 2 mg via INTRAVENOUS
  Filled 2017-04-15: qty 1
  Filled 2017-04-15: qty 2
  Filled 2017-04-15 (×3): qty 1

## 2017-04-15 MED ORDER — RISPERIDONE 0.5 MG PO TABS
0.5000 mg | ORAL_TABLET | Freq: Two times a day (BID) | ORAL | Status: DC
  Administered 2017-04-15 – 2017-04-21 (×13): 0.5 mg via ORAL
  Filled 2017-04-15 (×13): qty 1

## 2017-04-15 MED ORDER — POTASSIUM CHLORIDE 10 MEQ/100ML IV SOLN
10.0000 meq | INTRAVENOUS | Status: DC
  Administered 2017-04-15: 10 meq via INTRAVENOUS
  Filled 2017-04-15: qty 100

## 2017-04-15 MED ORDER — TOPIRAMATE 100 MG PO TABS
100.0000 mg | ORAL_TABLET | Freq: Every day | ORAL | Status: DC
  Administered 2017-04-15 – 2017-04-21 (×7): 100 mg via ORAL
  Filled 2017-04-15 (×7): qty 1

## 2017-04-15 MED ORDER — SODIUM CHLORIDE 0.9% FLUSH
3.0000 mL | Freq: Two times a day (BID) | INTRAVENOUS | Status: DC
  Administered 2017-04-15 – 2017-04-20 (×8): 3 mL via INTRAVENOUS

## 2017-04-15 MED ORDER — FUROSEMIDE 40 MG PO TABS
40.0000 mg | ORAL_TABLET | Freq: Every day | ORAL | Status: DC
  Administered 2017-04-15 – 2017-04-21 (×7): 40 mg via ORAL
  Filled 2017-04-15 (×7): qty 1

## 2017-04-15 MED ORDER — SODIUM CHLORIDE 0.9% FLUSH: 3.0000 mL | INTRAVENOUS | Status: DC | PRN

## 2017-04-15 MED ORDER — POTASSIUM CHLORIDE CRYS ER 20 MEQ PO TBCR
40.0000 meq | EXTENDED_RELEASE_TABLET | Freq: Once | ORAL | Status: AC
  Administered 2017-04-15: 40 meq via ORAL
  Filled 2017-04-15: qty 2

## 2017-04-15 MED ORDER — MUSCLE RUB 10-15 % EX CREA
1.0000 | TOPICAL_CREAM | CUTANEOUS | Status: DC | PRN
Start: 2017-04-15 — End: 2017-04-21
  Filled 2017-04-15: qty 85

## 2017-04-15 MED ORDER — LISINOPRIL 10 MG PO TABS
10.0000 mg | ORAL_TABLET | Freq: Every day | ORAL | Status: DC
  Administered 2017-04-15 – 2017-04-20 (×5): 10 mg via ORAL
  Filled 2017-04-15 (×6): qty 1

## 2017-04-15 MED ORDER — ACETAMINOPHEN 325 MG PO TABS: 650.0000 mg | ORAL_TABLET | Freq: Once | ORAL | Status: DC

## 2017-04-15 MED ORDER — MOMETASONE FURO-FORMOTEROL FUM 100-5 MCG/ACT IN AERO
2.0000 | INHALATION_SPRAY | Freq: Two times a day (BID) | RESPIRATORY_TRACT | Status: DC
  Administered 2017-04-15 – 2017-04-17 (×6): 2 via RESPIRATORY_TRACT
  Filled 2017-04-15: qty 8.8

## 2017-04-15 MED ORDER — LORAZEPAM 1 MG PO TABS
0.0000 mg | ORAL_TABLET | Freq: Two times a day (BID) | ORAL | Status: AC
  Administered 2017-04-18: 1 mg via ORAL
  Administered 2017-04-19: 2 mg via ORAL
  Filled 2017-04-15: qty 2
  Filled 2017-04-15: qty 1

## 2017-04-15 MED ORDER — LORAZEPAM 2 MG/ML IJ SOLN
0.0000 mg | Freq: Two times a day (BID) | INTRAMUSCULAR | Status: AC
  Administered 2017-04-17 – 2017-04-18 (×2): 2 mg via INTRAVENOUS
  Filled 2017-04-15 (×2): qty 1

## 2017-04-15 MED ORDER — SODIUM CHLORIDE 0.9 % IV SOLN: 250.0000 mL | INTRAVENOUS | Status: DC | PRN

## 2017-04-15 NOTE — ED Notes (Signed)
Pt called EMS for a severe pain in her side, she is very intoxicated

## 2017-04-15 NOTE — Progress Notes (Signed)
Pt's demonstrates a mild decrease in being restless and agitated. Maintain current   Plan of care

## 2017-04-15 NOTE — ED Notes (Signed)
Bed: TD42 Expected date:  Expected time:  Means of arrival:  Comments: EMS intoxicated right rib pain

## 2017-04-15 NOTE — ED Provider Notes (Signed)
[facility]-EMERGENCY DEPT Provider Note   CSN: [id number] Arrival date & time: 04/15/17  0215     History   Chief Complaint Chief Complaint  Patient presents with  . intoxicated    HPI Anne Black is a 57 y.o. female.  57 year old female with a history of ascites, bipolar disorder, cirrhosis secondary to alcohol use, multiple sclerosis presents to the emergency department for complaints of pain.  Patient states that she is in "so much pain".  She has been drinking to manage her symptoms.  She reports worsening pain over the past few days after a fall at home onto her right side.  Pain is aggravated with deep breathing.  She called EMS to transport her to the hospital after her pain was not improving.  She denies any back pain or associated numbness/paresthesias.  She has been seen frequently in the emergency department lately for various complaints.   The history is provided by the patient. No language interpreter was used.    Past Medical History:  Diagnosis Date  . ADHD (attention deficit hyperactivity disorder)   . Anxiety   . Arthritis   . Ascites   . Bipolar disorder (HCC)   . Cirrhosis (HCC)   . COPD (chronic obstructive pulmonary disease) (HCC)   . Hypokalemia   . Migraine   . Multiple sclerosis (HCC)   . Osteoporosis     Patient Active Problem List   Diagnosis Date Noted  . Abdominal wall cellulitis 12/30/2016  . S/P exploratory laparotomy 12/30/2016  . Distal radius fracture, left 09/09/2016  . Thrombocytopenia (HCC) 09/09/2016  . Chest x-ray abnormality   . Umbilical hernia without obstruction and without gangrene 05/12/2016  . Itching 02/24/2016  . Ventral hernia without obstruction or gangrene 02/24/2016  . Palliative care encounter   . Ascites   . Tachypnea   . Typical atrial flutter (HCC)   . Hypomagnesemia   . Hypoxia   . Dehydration 01/22/2016  . Low back ache 12/30/2015  . Non-intractable vomiting with nausea   .  Alcohol abuse with alcohol-induced mood disorder (HCC) 12/15/2015  . Acute alcoholism (HCC)   . Abdominal distension 11/04/2015  . Alcohol abuse   . Dyspnea   . Acute respiratory failure (HCC) 10/15/2015  . Ascites due to alcoholic cirrhosis (HCC) 10/15/2015  . Osteoporosis   . Bipolar I disorder (HCC)   . Multiple sclerosis (HCC)   . Alcoholic cirrhosis of liver with ascites (HCC) 08/22/2015  . Bipolar disorder, current episode mixed, moderate (HCC) 08/01/2015  . Alcohol use disorder, severe, dependence (HCC) 07/31/2015  . Macrocytic anemia- due to alcohol abuse with normal B12 & folate levels 05/31/2015  . Severe protein-calorie malnutrition (HCC) 05/31/2015  . Hypokalemia 05/26/2015  . Encephalopathy, hepatic (HCC) 05/26/2015  . Alcohol withdrawal (HCC) 05/18/2015  . Abdominal pain 05/18/2015  . Anxiety 05/18/2015  . UTI (lower urinary tract infection) 05/18/2015  . Stimulant abuse (HCC) 12/27/2014  . Nicotine abuse 12/27/2014  . Hyperprolactinemia (HCC) 11/15/2014  . Dyslipidemia   . Tobacco abuse 01/23/2014  . Noncompliance with therapeutic plan 04/04/2013  . Unspecified hereditary and idiopathic peripheral neuropathy 05/01/2012  . GERD (gastroesophageal reflux disease) 05/01/2012  . Osteoarthrosis, unspecified whether generalized or localized, involving lower leg 05/01/2012  . Hyponatremia 07/01/2011    Past Surgical History:  Procedure Laterality Date  . ABDOMINAL WALL DEFECT REPAIR N/A 12/30/2016   Procedure: EXPLORATORY LAPAROTOMY WITH REPAIR ABDOMINAL WALL VENTRAL HERNIA;  Surgeon: [name], MD;  Location: [facility] OR;  Service: General;  Laterality: N/A;  . APPLICATION OF WOUND VAC N/A 12/30/2016   Procedure: APPLICATION OF WOUND VAC;  Surgeon: [name], MD;  Location: [facility] OR;  Service: General;  Laterality: N/A;  . CESAREAN SECTION  [phone]  . FRACTURE SURGERY    . HERNIA REPAIR    . MYRINGOTOMY WITH TUBE PLACEMENT Bilateral   . ORIF WRIST  FRACTURE Left 09/09/2016   Procedure: OPEN REDUCTION INTERNAL FIXATION (ORIF) LEFT WRIST FRACTURE, LEFT CARPAL TUNNEL RELEASE;  Surgeon: Dominica Severin, MD;  Location: [facility] ORS;  Service: Orthopedics;  Laterality: Left;  . TONSILLECTOMY      OB History    No data available       Home Medications    Prior to Admission medications   Medication Sig Start Date End Date Taking? Authorizing Provider  acetaminophen (TYLENOL) 500 MG tablet Take 1 tablet (500 mg total) by mouth every 6 (six) hours as needed for mild pain or headache. 03/11/17  Yes Arrien, York Ram, MD  albuterol (PROVENTIL HFA;VENTOLIN HFA) 108 (90 Base) MCG/ACT inhaler Inhale 2 puffs into the lungs every 6 (six) hours as needed for wheezing or shortness of breath.   Yes [provider]  amLODipine (NORVASC) 10 MG tablet Take 10 mg by mouth daily.   Yes [provider]  EPINEPHrine (EPIPEN 2-PAK) 0.3 mg/0.3 mL IJ SOAJ injection Inject 0.3 mg into the muscle once.   Yes [provider]  FLUoxetine (PROZAC) 20 MG capsule Take 20 mg by mouth daily.  03/30/16  Yes [provider]  fluticasone (FLONASE) 50 MCG/ACT nasal spray Place 1 spray into both nostrils as needed for allergies or rhinitis.   Yes [provider]  Fluticasone-Salmeterol (ADVAIR) 100-50 MCG/DOSE AEPB Inhale 1-2 puffs 2 (two) times daily into the lungs.  11/05/14  Yes [provider]  furosemide (LASIX) 40 MG tablet Take 1 tablet (40 mg total) by mouth daily. 06/17/16  Yes Midge Minium, MD  gabapentin (NEURONTIN) 300 MG capsule Take 300 mg by mouth 3 (three) times daily.   Yes [provider]  lisdexamfetamine (VYVANSE) 70 MG capsule Take 70 mg by mouth daily.   Yes [provider]  lisinopril (PRINIVIL,ZESTRIL) 10 MG tablet Take 10 mg by mouth at bedtime.   Yes [provider]  meloxicam (MOBIC) 15 MG tablet Take 15 mg by mouth daily as needed for pain.   Yes [provider]    modafinil (PROVIGIL) 200 MG tablet Take 200 mg by mouth 2 (two) times daily.  08/04/16  Yes [provider]  ondansetron (ZOFRAN ODT) 4 MG disintegrating tablet Take 1 tablet (4 mg total) by mouth every 8 (eight) hours as needed for nausea or vomiting. 03/25/17  Yes Renne Crigler, PA-C  pantoprazole (PROTONIX) 40 MG tablet Take 1 tablet (40 mg total) by mouth daily. 12/30/15  Yes Haney, Alyssa A, MD  potassium chloride SA (K-DUR,KLOR-CON) 20 MEQ tablet Take 20 mEq by mouth daily.   Yes [provider]  risperiDONE (RISPERDAL) 0.5 MG tablet Take 1 tablet (0.5 mg total) by mouth 3 (three) times daily as needed (agitation). Patient taking differently: Take 0.5 mg by mouth 2 (two) times daily.  02/06/16  Yes Renne Musca, MD  rizatriptan (MAXALT) 10 MG tablet Take 1 tablet (10 mg total) by mouth as needed for migraine. May repeat in 2 hours if needed 03/25/17  Yes Renne Crigler, PA-C  spironolactone (ALDACTONE) 100 MG tablet Take 1 tablet (100 mg total) by mouth  daily. 11/23/16  Yes Derwood Kaplan, MD  topiramate (TOPAMAX) 100 MG tablet Take 1 tablet (100 mg total) by mouth daily. 01/07/16  Yes [name], MD  busPIRone (BUSPAR) 15 MG tablet TAKE 1 TABLET(15 MG) BY MOUTH TWICE DAILY Patient not taking: Reported on 04/15/2017 04/19/16   [name], MD  chlordiazePOXIDE (LIBRIUM) 25 MG capsule 50mg  PO TID x 1D, then 25-50mg  PO BID X 1D, then 25-50mg  PO QD X 1D Patient not taking: Reported on 04/15/2017 03/25/17   Renne Crigler, PA-C  gabapentin (NEURONTIN) 400 MG capsule Take 1 capsule (400 mg total) 3 (three) times daily by mouth. Patient not taking: Reported on 03/14/2017 12/26/16   Charm Rings, NP  ipratropium-albuterol (DUONEB) 0.5-2.5 (3) MG/3ML SOLN Take 3 mLs by nebulization every 6 (six) hours as needed. 03/11/17 04/10/17  Arrien, York Ram, MD  Melatonin 3 MG TABS Take 1 tablet (3 mg total) by mouth at bedtime as needed (sleep). Patient not taking:  Reported on 04/15/2017 10/24/15   Garth Bigness, MD  ondansetron (ZOFRAN) 4 MG tablet TAKE 1 TABLET(4 MG) BY MOUTH EVERY 8 HOURS AS NEEDED FOR NAUSEA OR VOMITING Patient not taking: Reported on 04/15/2017 03/12/17   Doristine Bosworth, MD  potassium chloride 20 MEQ/15ML (10%) SOLN Take 30 mLs (40 mEq total) by mouth 2 (two) times daily. Patient not taking: Reported on 04/15/2017 01/10/17   Marinda Elk, MD  rifaximin (XIFAXAN) 550 MG TABS tablet Take 1 tablet (550 mg total) by mouth 2 (two) times daily. Patient not taking: Reported on 01/17/2017 06/05/15   Catarina Hartshorn, MD  triamcinolone cream (KENALOG) 0.1 % Apply 1 application topically 2 (two) times daily. Patient not taking: Reported on 04/15/2017 03/25/17   Renne Crigler, PA-C    Family History Family History  Problem Relation Age of Onset  . Arrhythmia Mother   . Heart disease Father   . Hypertension Father     Social History Social History   Tobacco Use  . Smoking status: Current Every Day Smoker    Packs/day: 1.00    Years: 20.00    Pack years: 20.00    Types: Cigarettes  . Smokeless tobacco: Never Used  . Tobacco comment: Currently Vaping Daily  Substance Use Topics  . Alcohol use: Yes    Alcohol/week: 7.2 oz    Types: 12 Glasses of wine per week  . Drug use: No     Allergies   Patient has no known allergies.   Review of Systems Review of Systems Ten systems reviewed and are negative for acute change, except as noted in the HPI.    Physical Exam Updated Vital Signs BP (!) 115/96   Pulse 92   Temp 98.9 F (37.2 C) (Oral)   Resp (!) 23   SpO2 95%   Physical Exam  Constitutional: She is oriented to person, place, and time. She appears well-developed and well-nourished. No distress.  Clinically intoxicated, depressed mood.  HENT:  Head: Normocephalic and atraumatic.  Eyes: Conjunctivae and EOM are normal. No scleral icterus.  Neck: Normal range of motion.  Pulmonary/Chest: Effort normal. No respiratory  distress.  Tenderness to the right anterior chest wall without crepitus or deformity.  Respirations even and unlabored.  No associated bruising or contusion.  Musculoskeletal: Normal range of motion.  Neurological: She is alert and oriented to person, place, and time. She exhibits normal muscle tone. Coordination normal.  Moving all extremities spontaneously.  Patient answers questions appropriately and follows commands.  Skin:  Skin is warm and dry. No rash noted. She is not diaphoretic. No erythema. No pallor.  Psychiatric: She has a normal mood and affect. Her behavior is normal.  Nursing note and vitals reviewed.    ED Treatments / Results  Labs (all labs ordered are listed, but only abnormal results are displayed) Labs Reviewed  CBC - Abnormal; Notable for the following components:      Result Value   RBC 3.64 (*)    Hemoglobin 10.0 (*)    HCT 32.6 (*)    RDW 18.0 (*)    All other components within normal limits  BASIC METABOLIC PANEL - Abnormal; Notable for the following components:   Potassium 3.0 (*)    Chloride 96 (*)    CO2 35 (*)    Glucose, Bld 120 (*)    Calcium 8.3 (*)    All other components within normal limits  ETHANOL - Abnormal; Notable for the following components:   Alcohol, Ethyl (B) 264 (*)    All other components within normal limits    EKG  EKG Interpretation None       Radiology Dg Ribs Unilateral W/chest Right  Result Date: 04/15/2017 CLINICAL DATA:  Fall with right chest wall pain EXAM: RIGHT RIBS AND CHEST - 3+ VIEW COMPARISON:  03/14/2017 FINDINGS: Single-view chest demonstrates cardiomegaly with vascular congestion. No consolidation or pleural effusion. No pneumothorax. Right rib series demonstrates probable acute right ninth and tenth rib fractures. IMPRESSION: 1. Negative for pneumothorax or pleural effusion 2. Probable acute right ninth and tenth rib fractures 3. Cardiomegaly with vascular congestion. Electronically Signed   By: Jasmine Pang M.D.   On: 04/15/2017 03:22    Procedures Procedures (including critical care time)  Medications Ordered in ED Medications  lidocaine (LIDODERM) 5 % 1 patch (1 patch Transdermal Patch Removed 04/15/17 0354)  LORazepam (ATIVAN) injection 0-4 mg ( Intravenous See Alternative 04/15/17 0604)    Or  LORazepam (ATIVAN) tablet 0-4 mg (2 mg Oral Given 04/15/17 0604)  LORazepam (ATIVAN) injection 0-4 mg (not administered)    Or  LORazepam (ATIVAN) tablet 0-4 mg (not administered)  thiamine (VITAMIN B-1) tablet 100 mg (not administered)    Or  thiamine (B-1) injection 100 mg (not administered)  potassium chloride 10 mEq in 100 mL IVPB (not administered)  HYDROcodone-acetaminophen (NORCO/VICODIN) 5-325 MG per tablet 1 tablet (1 tablet Oral Given 04/15/17 0353)  ketorolac (TORADOL) 30 MG/ML injection 30 mg (30 mg Intramuscular Given 04/15/17 0354)     Initial Impression / Assessment and Plan / ED Course  I have reviewed the triage vital signs and the nursing notes.  Pertinent labs & imaging results that were available during my care of the patient were reviewed by me and considered in my medical decision making (see chart for details).     56 year old female presents to the emergency department, intoxicated.  She reports a fall 2 days ago with associated right lower chest wall pain.  This is reproducible on palpation.  X-ray obtained which shows probable acute 9th and 10th rib fractures.  Patient afebrile and without evidence of pneumothorax or pleural effusion.  Labs generally reassuring; anemia at baseline and will replete potassium.  Ethanol confirms intoxication.  She has no history of chronic oxygen requirement, but continues to have desaturations to as low as 82% on room air with a good waveform.  This resolves with supplemental oxygen via nasal cannula.    Suspect that hypoxia is due to prolonged splinting from ongoing  chest wall pain 2/2 fractures.  She has not had any improvement to  her saturations despite attempt at pain control.  Narcotics used sparingly given acute intoxication.  Patient would likely benefit from observation admission and pulmonary toilet.  Discussed need for admission with Dr. Toniann Fail; anticipate evaluation by the morning TRH team.  CIWA ordered in the interim.    Final Clinical Impressions(s) / ED Diagnoses   Final diagnoses:  Closed fracture of multiple ribs of right side, initial encounter  Hypoxia  Alcoholic intoxication without complication [facility])  Hypokalemia    ED Discharge Orders    None       Antony Madura, PA-C 04/15/17 0656    Paula Libra, MD 04/15/17 [phone]

## 2017-04-15 NOTE — ED Notes (Signed)
Pt given a sandwich and ginger ale

## 2017-04-15 NOTE — ED Notes (Signed)
ED TO INPATIENT HANDOFF REPORT  Name/Age/Gender Anne Black 57 y.o. female  Code Status Code Status History    Date Active Date Inactive Code Status Order ID Comments User Context   03/14/2017 17:43 03/15/2017 16:22 Full Code [id number]  [facility]   03/07/2017 20:38 03/11/2017 21:21 Full Code [id number]  Phillips Grout, MD ED   01/17/2017 17:36 01/18/2017 18:23 Full Code [id number]  Margarita Mail, PA-C ED   12/30/2016 12:33 01/11/2017 18:18 Full Code [id number]  Rolm Bookbinder, MD Inpatient   12/30/2016 01:01 12/30/2016 12:33 Full Code [id number]  Norval Morton, MD ED   12/25/2016 20:57 12/26/2016 15:54 Full Code [id number]  Sharlett Iles, MD ED   12/09/2016 05:13 12/09/2016 12:50 Full Code [id number]  Rolland Porter, MD ED   09/09/2016 18:27 09/11/2016 19:49 Full Code [id number]  Vianne Bulls, MD ED   08/08/2016 01:45 08/09/2016 16:44 Full Code [id number]  Abigail Butts, PA-C ED   01/22/2016 23:59 02/06/2016 16:52 Full Code [id number]  Smiley Houseman, MD Inpatient   12/18/2015 17:17 12/19/2015 14:39 Full Code [id number]  Katheren Shams, DO Inpatient   11/04/2015 15:57 11/05/2015 19:41 Full Code [id number]  Smiley Houseman, MD Inpatient   10/15/2015 15:21 10/24/2015 21:08 Full Code [id number]  Nicolette Bang, DO Inpatient   08/22/2015 21:12 08/30/2015 17:45 Full Code [id number]  Etta Quill, DO ED   07/31/2015 15:44 08/04/2015 16:47 Full Code [id number]  Patrecia Pour, NP Inpatient   07/31/2015 00:41 07/31/2015 15:44 Full Code [id number]  Antonietta Breach, PA-C ED   05/18/2015 15:31 06/05/2015 14:56 Full Code [id number]  Cristal Ford, DO Inpatient   02/21/2015 01:36 02/21/2015 14:29 Full Code [id number]  Harriet Butte, NP Inpatient   02/20/2015 17:37 02/21/2015 01:36 Full Code [id number]  Davonna Belling, MD ED   11/12/2014 16:16 11/16/2014 17:27 Full Code [id number]  Delfin Gant, NP Inpatient   11/12/2014 16:06 11/12/2014 16:16 Full Code [id number]  Delfin Gant, NP Inpatient   11/12/2014 01:41 11/12/2014 16:05 Full Code [id number]  Charlann Lange, PA-C ED   10/30/2014 22:42 11/05/2014 16:26 Full Code [id number]  Delfin Gant, NP Inpatient   10/30/2014 00:04 10/30/2014 22:42 Full Code [id number]  Wandra Arthurs, MD ED   04/07/2013 20:41 04/12/2013 16:37 Full Code [id number]  Dara Hoyer, PA-C Inpatient   04/06/2013 23:13 04/07/2013 20:41 Full Code [id number]  Wandra Arthurs, MD ED   11/01/2011 04:23 11/02/2011 13:45 Full Code [id number]  Etta Quill., DO ED   09/26/2011 22:15 09/28/2011 01:45 Full Code [id number]  Veryl Speak, MD ED   05/17/2011 07:16 05/20/2011 21:52 Full Code [id number]  Luciano C[facility] ED      Home/SNF/Other Home  Chief Complaint intoxication   Level of Care/Admitting Diagnosis ED Disposition    ED Disposition Condition [facility] Area: [place] [100102]  Level of Care: Telemetry [5]  Admit to tele based on following criteria: Monitor QTC interval  Diagnosis: Right rib fracture [709643]  Admitting Physician: Bonnell Public [[id number]]  Attending Physician: Dana Allan I [[id number]]  Estimated length of stay: past midnight tomorrow  Certification:: I certify this patient will need inpatient services for at least 2 midnights  PT Class (Do Not Modify): Inpatient [101]  PT Acc Code (Do Not Modify): Private [1]       Medical History Past Medical History:  Diagnosis Date  . ADHD (attention deficit hyperactivity disorder)   . Anxiety   .  Arthritis   . Ascites   . Bipolar disorder ([place])   . Cirrhosis ([place])   . COPD (chronic obstructive pulmonary disease) ([place])   . Hypokalemia   . Migraine   . Multiple sclerosis ([place])   . Osteoporosis     Allergies No Known Allergies  IV Location/Drains/Wounds Patient Lines/Drains/Airways Status   Active Line/Drains/Airways    Name:   Placement date:   Placement time:   Site:   Days:   Peripheral IV 03/08/17 Left;Anterior  Forearm   03/08/17    2246    Forearm   38   Peripheral IV 04/15/17 Right Hand   04/15/17    0720    Hand   less than 1   Incision (Closed) 03/10/17   03/10/17    2015     36          Labs/Imaging Results for orders placed or performed during the hospital encounter of 04/15/17 (from the past 48 hour(s))  CBC     Status: Abnormal   Collection Time: 04/15/17  6:07 AM  Result Value Ref Range   WBC 4.4 4.0 - 10.5 K/uL   RBC 3.64 (L) 3.87 - 5.11 MIL/uL   Hemoglobin 10.0 (L) 12.0 - 15.0 g/dL   HCT 32.6 (L) 36.0 - 46.0 %   MCV 89.6 78.0 - 100.0 fL   MCH 27.5 26.0 - 34.0 pg   MCHC 30.7 30.0 - 36.0 g/dL   RDW 18.0 (H) 11.5 - 15.5 %   Platelets 154 150 - 400 K/uL    Comment: Performed at [facility], [place] [address] [place], [place] [address]  Basic metabolic panel     Status: Abnormal   Collection Time: 04/15/17  6:07 AM  Result Value Ref Range   Sodium 141 135 - 145 mmol/L   Potassium 3.0 (L) 3.5 - 5.1 mmol/L   Chloride 96 (L) 101 - 111 mmol/L   CO2 35 (H) 22 - 32 mmol/L   Glucose, Bld 120 (H) 65 - 99 mg/dL   BUN 9 6 - 20 mg/dL   Creatinine, Ser 0.52 0.44 - 1.00 mg/dL   Calcium 8.3 (L) 8.9 - 10.3 mg/dL   GFR calc non Af Amer >60 >60 mL/min   GFR calc Af Amer >60 >60 mL/min    Comment: (NOTE) The eGFR has been calculated using the CKD EPI equation. This calculation has not been validated in all clinical situations. eGFR's persistently <60 mL/min signify possible Chronic Kidney Disease.    Anion gap 10 5 - 15    Comment: Performed at [facility], [place] [address], [place], [place] [address]  Ethanol     Status: Abnormal   Collection Time: 04/15/17  6:07 AM  Result Value Ref Range   Alcohol, Ethyl (B) 264 (H) <10 mg/dL    Comment:        LOWEST DETECTABLE LIMIT FOR SERUM ALCOHOL IS 10 mg/dL FOR MEDICAL PURPOSES ONLY Performed at [place] [facility] [address], [place], [place] [address]    Dg Ribs Unilateral W/chest  Right  Result Date: 04/15/2017 CLINICAL DATA:  Fall with right chest wall pain EXAM: RIGHT RIBS AND CHEST - 3+ VIEW COMPARISON:  03/14/2017 FINDINGS: Single-view chest demonstrates cardiomegaly with vascular congestion. No consolidation or pleural effusion. No pneumothorax. Right rib series demonstrates probable acute right ninth and tenth rib fractures. IMPRESSION: 1. Negative for pneumothorax or pleural effusion 2. Probable acute right ninth and tenth rib fractures 3.  Cardiomegaly with vascular congestion. Electronically Signed   By: Donavan Foil M.D.   On: 04/15/2017 03:22    Pending Labs Unresulted Labs (From admission, onward)   Start     Ordered   Signed and Held  CBC  (enoxaparin (LOVENOX)    CrCl >/= 30 ml/min)  Once,   R    Comments:  Baseline for enoxaparin therapy IF NOT ALREADY DRAWN.  Notify MD if PLT < 100 K.    Signed and Held   Signed and Held  Creatinine, serum  (enoxaparin (LOVENOX)    CrCl >/= 30 ml/min)  Once,   R    Comments:  Baseline for enoxaparin therapy IF NOT ALREADY DRAWN.    Signed and Held   Signed and Held  Creatinine, serum  (enoxaparin (LOVENOX)    CrCl >/= 30 ml/min)  Weekly,   R    Comments:  while on enoxaparin therapy    Signed and Held   Signed and Held  Magnesium  Once,   R     Signed and Held   Signed and Held  Phosphorus  Once,   R     Signed and Held   Signed and Held  Comprehensive metabolic panel  Tomorrow morning,   R     Signed and Held   Signed and Held  Magnesium  Tomorrow morning,   R     Signed and Held   Signed and Held  Phosphorus  Tomorrow morning,   R     Signed and Held      Vitals/Pain Today's Vitals   04/15/17 0603 04/15/17 0614 04/15/17 0730 04/15/17 0800  BP: (!) 115/96  112/60 116/61  Pulse: 92  84 85  Resp:   18 18  Temp:    98.6 F (37 C)  TempSrc:    Oral  SpO2:   99% 100%  PainSc:  6  10-Worst pain ever     Isolation Precautions No active isolations  Medications Medications  lidocaine (LIDODERM) 5 % 1  patch (1 patch Transdermal Patch Removed 04/15/17 0354)  LORazepam (ATIVAN) injection 0-4 mg ( Intravenous See Alternative 04/15/17 0604)    Or  LORazepam (ATIVAN) tablet 0-4 mg (2 mg Oral Given 04/15/17 0604)  LORazepam (ATIVAN) injection 0-4 mg (not administered)    Or  LORazepam (ATIVAN) tablet 0-4 mg (not administered)  thiamine (VITAMIN B-1) tablet 100 mg (not administered)    Or  thiamine (B-1) injection 100 mg (not administered)  potassium chloride 10 mEq in 100 mL IVPB (0 mEq Intravenous Stopped 04/15/17 0821)  HYDROcodone-acetaminophen (NORCO/VICODIN) 5-325 MG per tablet 1 tablet (1 tablet Oral Given 04/15/17 0353)  ketorolac (TORADOL) 30 MG/ML injection 30 mg (30 mg Intramuscular Given 04/15/17 0354)    Mobility walks

## 2017-04-15 NOTE — Progress Notes (Signed)
Pt with past history of Exploratory Laparotomy 12/2016. First assessment of Abdominal Incision on admission @ 8:40 am site was dry. Site now bleeding and Pt states "I scratched it it was itching" Site cleaned up and a dressing applied to prevent Pt from scratching area again

## 2017-04-15 NOTE — Progress Notes (Signed)
Pt very agitated and trying to get out of bed. New orders received from MD. IV Ativan given per new orders. Maintain current plan of care.

## 2017-04-15 NOTE — H&P (Signed)
History and Physical  Anne MIZRACHI ZOX:[id number] DOB: 08-06-1960 DOA: 04/15/2017  Referring physician: ER physician. PCP: Doristine Bosworth, MD  Outpatient Specialists:    Patient coming from: Home.  Chief Complaint: Right-sided rib cage pain.  HPI:  Patient is a 57 year old Caucasian female with past medical history significant for alcohol abuse, alcoholic liver disease, liver cirrhosis, bipolar, ascites, arthritis, anxiety, ADHD, osteoporosis, hypokalemia, migraine and documented multiple sclerosis.  Currently the patient, she had a mechanical fall at home 2 days ago.  The patient reports having fallen and hitting the countertop.  Subsequently, the patient developed right-sided rib cage pain.  The patient has been drinking excessively to keep the pain under control.  Due to the extensive nature of the pain, the patient has decided to come to the hospital for further assessment and management.  Patient is not a particularly good historian.  On presentation to the hospital, the alcohol level was 264 with a potassium of 3 and platelet count of 154.  The patient was noted to be hypoxemic, likely secondary to splinting.  No headache, no neck pain, no GI symptoms, no urinary symptoms, no joint ache and no calf pain or tenderness.  Chest x-ray and rib x-ray done revealed likely right-sided rib fracture involving the ninth and 10th rib.  Patient will be admitted for further assessment and management.  ED Course: Chest x-ray and rib x-ray done revealed probable fracture involving the ninth and the 10th rib on the right side.  Alcohol level was 264 on presentation.  Potassium level was started.  The platelet count was 154.  The patient has been hydrated, given pain medications, and potassium replacement is in progress.  The patient has also been started on CIWA Protocol. Pertinent labs: As documented above. EKG: Independently reviewed.  Imaging: independently reviewed.  Review of Systems: 12 systems were  reviewed.  Pertinent positives and negatives as in the history of presenting illness. Negative for fever, visual changes, sore throat, rash, new muscle aches, dysuria, bleeding, n/v/abdominal pain.  Past Medical History:  Diagnosis Date  . ADHD (attention deficit hyperactivity disorder)   . Anxiety   . Arthritis   . Ascites   . Bipolar disorder ([facility])   . Cirrhosis ([facility])   . COPD (chronic obstructive pulmonary disease) ([facility])   . Hypokalemia   . Migraine   . Multiple sclerosis ([facility])   . Osteoporosis     Past Surgical History:  Procedure Laterality Date  . ABDOMINAL WALL DEFECT REPAIR N/A 12/30/2016   Procedure: EXPLORATORY LAPAROTOMY WITH REPAIR ABDOMINAL WALL VENTRAL HERNIA;  Surgeon: [name], MD;  Location: [facility] OR;  Service: General;  Laterality: N/A;  . APPLICATION OF WOUND VAC N/A 12/30/2016   Procedure: APPLICATION OF WOUND VAC;  Surgeon: [name], MD;  Location: [facility] OR;  Service: General;  Laterality: N/A;  . CESAREAN SECTION  [id number]  . FRACTURE SURGERY    . HERNIA REPAIR    . MYRINGOTOMY WITH TUBE PLACEMENT Bilateral   . ORIF WRIST FRACTURE Left 09/09/2016   Procedure: OPEN REDUCTION INTERNAL FIXATION (ORIF) LEFT WRIST FRACTURE, LEFT CARPAL TUNNEL RELEASE;  Surgeon: Dominica Severin, MD;  Location: [facility] ORS;  Service: Orthopedics;  Laterality: Left;  . TONSILLECTOMY       reports that she has been smoking cigarettes.  She has a 20.00 pack-year smoking history. she has never used smokeless tobacco. She reports that she drinks about 7.2 oz of alcohol per week. She reports that she does not use drugs.  No  Known Allergies  Family History  Problem Relation Age of Onset  . Arrhythmia Mother   . Heart disease Father   . Hypertension Father      Prior to Admission medications   Medication Sig Start Date End Date Taking? Authorizing Provider  acetaminophen (TYLENOL) 500 MG tablet Take 1 tablet (500 mg total) by mouth every 6 (six) hours as needed for mild  pain or headache. 03/11/17  Yes Arrien, York Ram, MD  albuterol (PROVENTIL HFA;VENTOLIN HFA) 108 (90 Base) MCG/ACT inhaler Inhale 2 puffs into the lungs every 6 (six) hours as needed for wheezing or shortness of breath.   Yes [provider]  amLODipine (NORVASC) 10 MG tablet Take 10 mg by mouth daily.   Yes [provider]  EPINEPHrine (EPIPEN 2-PAK) 0.3 mg/0.3 mL IJ SOAJ injection Inject 0.3 mg into the muscle once.   Yes [provider]  FLUoxetine (PROZAC) 20 MG capsule Take 20 mg by mouth daily.  03/30/16  Yes [provider]  fluticasone (FLONASE) 50 MCG/ACT nasal spray Place 1 spray into both nostrils as needed for allergies or rhinitis.   Yes [provider]  Fluticasone-Salmeterol (ADVAIR) 100-50 MCG/DOSE AEPB Inhale 1-2 puffs 2 (two) times daily into the lungs.  11/05/14  Yes [provider]  furosemide (LASIX) 40 MG tablet Take 1 tablet (40 mg total) by mouth daily. 06/17/16  Yes Midge Minium, MD  gabapentin (NEURONTIN) 300 MG capsule Take 300 mg by mouth 3 (three) times daily.   Yes [provider]  lisdexamfetamine (VYVANSE) 70 MG capsule Take 70 mg by mouth daily.   Yes [provider]  lisinopril (PRINIVIL,ZESTRIL) 10 MG tablet Take 10 mg by mouth at bedtime.   Yes [provider]  meloxicam (MOBIC) 15 MG tablet Take 15 mg by mouth daily as needed for pain.   Yes [provider]  modafinil (PROVIGIL) 200 MG tablet Take 200 mg by mouth 2 (two) times daily.  08/04/16  Yes [provider]  ondansetron (ZOFRAN ODT) 4 MG disintegrating tablet Take 1 tablet (4 mg total) by mouth every 8 (eight) hours as needed for nausea or vomiting. 03/25/17  Yes Renne Crigler, PA-C  pantoprazole (PROTONIX) 40 MG tablet Take 1 tablet (40 mg total) by mouth daily. 12/30/15  Yes Haney, Alyssa A, MD  potassium chloride SA (K-DUR,KLOR-CON) 20 MEQ tablet Take 20 mEq by mouth daily.   Yes [provider]    risperiDONE (RISPERDAL) 0.5 MG tablet Take 1 tablet (0.5 mg total) by mouth 3 (three) times daily as needed (agitation). Patient taking differently: Take 0.5 mg by mouth 2 (two) times daily.  02/06/16  Yes Renne Musca, MD  rizatriptan (MAXALT) 10 MG tablet Take 1 tablet (10 mg total) by mouth as needed for migraine. May repeat in 2 hours if needed 03/25/17  Yes Renne Crigler, PA-C  spironolactone (ALDACTONE) 100 MG tablet Take 1 tablet (100 mg total) by mouth daily. 11/23/16  Yes Derwood Kaplan, MD  topiramate (TOPAMAX) 100 MG tablet Take 1 tablet (100 mg total) by mouth daily. 01/07/16  Yes [name], MD  ipratropium-albuterol (DUONEB) 0.5-2.5 (3) MG/3ML SOLN Take 3 mLs by nebulization every 6 (six) hours as needed. 03/11/17 04/10/17  Coralie Keens, MD    Physical Exam: Vitals:   04/15/17 0500 04/15/17 0600 04/15/17 0603 04/15/17 0730  BP: (!) 106/51 (!) 115/96 (!) 115/96 112/60  Pulse: 89 89 92 84  Resp: 18 (!) 23  18  Temp:  TempSrc:      SpO2: 99% 95%  99%     Constitutional:  . Appears calm and comfortable Eyes:  Marland Kitchen Patient is pale and jaundiced.    ENMT:  external ears, nose appear normal.  Tongue is dry.  Neck:  . Neck is supple. No JVD Respiratory:  . Decreased air entry globally.   Cardiovascular:  . S1S2 . Mild LE extremity edema   Abdomen:  . Abdomen is obese, soft and non tender. Organs are difficult to assess. Neurologic:  . Awake and alert. . Moves all limbs. Extremities: Please reveals mild leg edema.  Wt Readings from Last 3 Encounters:  03/25/17 72.6 kg (160 lb)  03/08/17 76.5 kg (168 lb 10.4 oz)  01/17/17 80.3 kg (177 lb)    I have personally reviewed following labs and imaging studies  Labs on Admission:  CBC: Recent Labs  Lab 04/15/17 0607  WBC 4.4  HGB 10.0*  HCT 32.6*  MCV 89.6  PLT 154   Basic Metabolic Panel: Recent Labs  Lab 04/15/17 0607  NA 141  K 3.0*  CL 96*  CO2 35*  GLUCOSE 120*  BUN 9   CREATININE 0.52  CALCIUM 8.3*   Liver Function Tests: No results for input(s): AST, ALT, ALKPHOS, BILITOT, PROT, ALBUMIN in the last 168 hours. No results for input(s): LIPASE, AMYLASE in the last 168 hours. No results for input(s): AMMONIA in the last 168 hours. Coagulation Profile: No results for input(s): INR, PROTIME in the last 168 hours. Cardiac Enzymes: No results for input(s): CKTOTAL, CKMB, CKMBINDEX, TROPONINI in the last 168 hours. BNP (last 3 results) No results for input(s): PROBNP in the last 8760 hours. HbA1C: No results for input(s): HGBA1C in the last 72 hours. CBG: No results for input(s): GLUCAP in the last 168 hours. Lipid Profile: No results for input(s): CHOL, HDL, LDLCALC, TRIG, CHOLHDL, LDLDIRECT in the last 72 hours. Thyroid Function Tests: No results for input(s): TSH, T4TOTAL, FREET4, T3FREE, THYROIDAB in the last 72 hours. Anemia Panel: No results for input(s): VITAMINB12, FOLATE, FERRITIN, TIBC, IRON, RETICCTPCT in the last 72 hours. Urine analysis:    Component Value Date/Time   COLORURINE YELLOW 03/14/2017 1842   APPEARANCEUR CLEAR 03/14/2017 1842   LABSPEC 1.006 03/14/2017 1842   PHURINE 8.0 03/14/2017 1842   GLUCOSEU NEGATIVE 03/14/2017 1842   HGBUR MODERATE (A) 03/14/2017 1842   BILIRUBINUR NEGATIVE 03/14/2017 1842   BILIRUBINUR moderate (A) 06/19/2015 1906   KETONESUR NEGATIVE 03/14/2017 1842   PROTEINUR NEGATIVE 03/14/2017 1842   UROBILINOGEN >=8.0 06/19/2015 1906   UROBILINOGEN 0.2 09/14/2011 2141   NITRITE NEGATIVE 03/14/2017 1842   LEUKOCYTESUR TRACE (A) 03/14/2017 1842   Sepsis Labs: @LABRCNTIP (procalcitonin:4,lacticidven:4) )No results found for this or any previous visit (from the past 240 hour(s)).    Radiological Exams on Admission: Dg Ribs Unilateral W/chest Right  Result Date: 04/15/2017 CLINICAL DATA:  Fall with right chest wall pain EXAM: RIGHT RIBS AND CHEST - 3+ VIEW COMPARISON:  03/14/2017 FINDINGS: Single-view chest  demonstrates cardiomegaly with vascular congestion. No consolidation or pleural effusion. No pneumothorax. Right rib series demonstrates probable acute right ninth and tenth rib fractures. IMPRESSION: 1. Negative for pneumothorax or pleural effusion 2. Probable acute right ninth and tenth rib fractures 3. Cardiomegaly with vascular congestion. Electronically Signed   By: Jasmine Pang M.D.   On: 04/15/2017 03:22    EKG: Independently reviewed.   Active Problems:   Right rib fracture   Assessment/Plan 1. Right-sided chest pain, likely secondary  to ninth and 10th right-sided rib fracture: -Adequate pain control. -Incentive spirometry. -Flutter valve. -Further management depend on hospital course  Alcohol abuse, continuous. -Continues to work protocol. -Patient has been counseled. -Folate and thiamine.  Alcoholic liver disease/cirrhosis: -Counseled to quit alcohol use. -Continue to monitor for alcohol withdrawal.  COPD: -Stable.  Hypokalemia: -Will replete. -We checked magnesium and phosphorous.     DVT prophylaxis: Subcutaneous Lovenox Code Status: Full Family Communication:  Disposition Plan: To be determined. Consults called: None Admission status: Inpatient  Time spent: 61 minutes  Berton Mount, MD  [facility] Pager #: [phone] 7PM-7AM contact night coverage as above   04/15/2017, 7:57 AM

## 2017-04-15 NOTE — Progress Notes (Signed)
Pt with increased restlessness and agitation. "I want to leave here now" Very Anxious and unable to calm or redirect Pt, MD paged

## 2017-04-16 DIAGNOSIS — F10920 Alcohol use, unspecified with intoxication, uncomplicated: Secondary | ICD-10-CM

## 2017-04-16 DIAGNOSIS — S2241XA Multiple fractures of ribs, right side, initial encounter for closed fracture: Principal | ICD-10-CM

## 2017-04-16 LAB — COMPREHENSIVE METABOLIC PANEL
ALT: 32 U/L (ref 14–54)
AST: 63 U/L — ABNORMAL HIGH (ref 15–41)
Albumin: 3.5 g/dL (ref 3.5–5.0)
Alkaline Phosphatase: 131 U/L — ABNORMAL HIGH (ref 38–126)
Anion gap: 9 (ref 5–15)
BUN: 8 mg/dL (ref 6–20)
CO2: 28 mmol/L (ref 22–32)
Calcium: 8.9 mg/dL (ref 8.9–10.3)
Chloride: 99 mmol/L — ABNORMAL LOW (ref 101–111)
Creatinine, Ser: 0.42 mg/dL — ABNORMAL LOW (ref 0.44–1.00)
GFR calc Af Amer: >60 mL/min (ref >60)
GFR calc non Af Amer: >60 mL/min (ref >60)
Glucose, Bld: 121 mg/dL — ABNORMAL HIGH (ref 65–99)
Potassium: 3.4 mmol/L — ABNORMAL LOW (ref 3.5–5.1)
Sodium: 136 mmol/L (ref 135–145)
Total Bilirubin: 2 mg/dL — ABNORMAL HIGH (ref 0.3–1.2)
Total Protein: 6.6 g/dL (ref 6.5–8.1)

## 2017-04-16 LAB — MAGNESIUM: Magnesium: 1.7 mg/dL (ref 1.7–2.4)

## 2017-04-16 LAB — PHOSPHORUS: Phosphorus: 2.9 mg/dL (ref 2.5–4.6)

## 2017-04-16 MED ORDER — POTASSIUM CHLORIDE CRYS ER 20 MEQ PO TBCR
20.0000 meq | EXTENDED_RELEASE_TABLET | Freq: Once | ORAL | Status: AC
  Administered 2017-04-16: 20 meq via ORAL
  Filled 2017-04-16: qty 1

## 2017-04-16 MED ORDER — HYDROCORTISONE 1 % EX CREA
TOPICAL_CREAM | Freq: Three times a day (TID) | CUTANEOUS | Status: DC
  Administered 2017-04-16 – 2017-04-21 (×14): via TOPICAL
  Filled 2017-04-16: qty 28

## 2017-04-16 MED ORDER — HALOPERIDOL LACTATE 5 MG/ML IJ SOLN: 5.0000 mg | Freq: Once | INTRAMUSCULAR | Status: AC

## 2017-04-16 MED ORDER — HALOPERIDOL LACTATE 5 MG/ML IJ SOLN
INTRAMUSCULAR | Status: AC
  Administered 2017-04-16: 5 mg
  Filled 2017-04-16: qty 1

## 2017-04-16 NOTE — Progress Notes (Signed)
SATURATION QUALIFICATIONS: (This note is used to comply with regulatory documentation for home oxygen)  Patient Saturations on Room Air at Rest = 89%  Patient Saturations on Room Air while Ambulating = 87%  Patient Saturations on 2 Liters of oxygen while Ambulating = 94%   Please briefly explain why patient needs home oxygen: at this time patient continues to desat with activity

## 2017-04-16 NOTE — Progress Notes (Signed)
Pt was tachycardic with uncontrolled rate in 130-150's. VSS, asymptomatic. Will continue to monitor.

## 2017-04-16 NOTE — Progress Notes (Signed)
Patient very confused, does not know where she is at and is insisting she has to go home.  Walked patient in hallway, patient urinated in middle of hallway.  Ativan 2 mg IV given for anxiety as well as oxycodone for pain in ribs.  Heart rate fluctuating from 100-160's.

## 2017-04-16 NOTE — Progress Notes (Signed)
PROGRESS NOTE    Anne Black  WUJ:[id number] DOB: 11-23-1960 DOA: 04/15/2017 PCP: Doristine Bosworth, MD   Brief Narrative:  Patient is a 57 year old female with past medical history of chronic alcohol abuse, alcoholic liver disease, liver cirrhosis, bipolar disorder, ascites, ADHD, multiple sclerosis who presented to the emergency department with complaint of right-sided chest pain.  Patient reported to have a mechanical fall at home 2 days ago.  Patient was also found to be intoxicated with alcohol on presentation. Chest x-ray done in the emergency department showed right-sided rib fracture involving the ninth and 10th rib.  Patient is currently being managed for alcohol intoxication and possible alcohol withdrawal.  Assessment & Plan:   Active Problems:   Right rib fracture  Right-sided chest pain: Secondary to fall sustaining the fracture.  Patient has fracture of ninth and 10th right-sided ribs.  Continue pain control.  Incentive spirometry. Fall was most likely associated with alcohol intoxication.  Chronic alcohol abuse: Patient intoxicated on presentation.  Alcohol level found to be in the range of 264 on presentation.  Counseled for alcohol cessation. Started on CIWA protocol.  Not on withdrawal at the moment but has very high chance of that. Continue thiamine and folic acid. Patient also benefits from follow-up with alcohol rehabilitation is an outpatient.  Will request for social work evaluation.    COPD: Currently stable.  History of alcoholic liver disease/cirrhosis/ascites: Continue diuretics.  She is on Lasix and spironolactone.  Does not need paracentesis at present. Liver enzymes are not significantly elevated.  Hypokalemia: Supplemented with potassium     DVT prophylaxis: Lovenox Code Status: Full Family Communication: None present at the bedside Disposition Plan: Home after resolution of alcohol intoxication/withdrawal   Consultants: None  Procedures:  None  Antimicrobials: None  Subjective: Patient seen and examined the bedside this morning.  Hemodynamically stable.  Constantly says that she wants to go home.  Sitter on the bedside.  Patient is disoriented  Objective: Vitals:   04/16/17 0136 04/16/17 0609 04/16/17 0933 04/16/17 1000  BP: 107/65 108/64  126/61  Pulse: 91 82  87  Resp: 16 16  16   Temp: 99.2 F (37.3 C) 98.8 F (37.1 C)  98.4 F (36.9 C)  TempSrc: Oral Oral  Oral  SpO2: 97% 95% 93% 97%  Weight:      Height:        Intake/Output Summary (Last 24 hours) at 04/16/2017 1438 Last data filed at 04/16/2017 0200 Gross per 24 hour  Intake 0 ml  Output 300 ml  Net -300 ml   Filed Weights   04/15/17 0916  Weight: 73 kg (161 lb)    Examination:  General exam: Not in distress,average built HEENT:PERRL,Oral mucosa moist, Ear/Nose normal on gross exam Respiratory system: Bilateral equal air entry, normal vesicular breath sounds, no wheezes or crackles  Cardiovascular system: S1 & S2 heard, RRR. No JVD, murmurs, rubs, gallops or clicks. No pedal edema. Gastrointestinal system: Abdomen is mildly distended, soft and nontender. No organomegaly or masses felt. Normal bowel sounds heard. Central nervous system: Alert but not  oriented. No focal neurological deficits. Extremities: No edema, no clubbing ,no cyanosis, distal peripheral pulses palpable. Skin: No rashes, lesions or ulcers,no icterus ,no pallor MSK: Normal muscle bulk,tone ,power    Data Reviewed: I have personally reviewed following labs and imaging studies  CBC: Recent Labs  Lab 04/15/17 0607  WBC 4.4  HGB 10.0*  HCT 32.6*  MCV 89.6  PLT 154   Basic Metabolic  Panel: Recent Labs  Lab 04/15/17 0607 04/16/17 0454  NA 141 136  K 3.0* 3.4*  CL 96* 99*  CO2 35* 28  GLUCOSE 120* 121*  BUN 9 8  CREATININE 0.52 0.42*  CALCIUM 8.3* 8.9  MG 1.8 1.7  PHOS 4.4 2.9   GFR: Estimated Creatinine Clearance: 72.6 mL/min (A) (by C-G formula based on SCr  of 0.42 mg/dL (L)). Liver Function Tests: Recent Labs  Lab 04/16/17 0454  AST 63*  ALT 32  ALKPHOS 131*  BILITOT 2.0*  PROT 6.6  ALBUMIN 3.5   No results for input(s): LIPASE, AMYLASE in the last 168 hours. No results for input(s): AMMONIA in the last 168 hours. Coagulation Profile: No results for input(s): INR, PROTIME in the last 168 hours. Cardiac Enzymes: No results for input(s): CKTOTAL, CKMB, CKMBINDEX, TROPONINI in the last 168 hours. BNP (last 3 results) No results for input(s): PROBNP in the last 8760 hours. HbA1C: No results for input(s): HGBA1C in the last 72 hours. CBG: No results for input(s): GLUCAP in the last 168 hours. Lipid Profile: No results for input(s): CHOL, HDL, LDLCALC, TRIG, CHOLHDL, LDLDIRECT in the last 72 hours. Thyroid Function Tests: No results for input(s): TSH, T4TOTAL, FREET4, T3FREE, THYROIDAB in the last 72 hours. Anemia Panel: No results for input(s): VITAMINB12, FOLATE, FERRITIN, TIBC, IRON, RETICCTPCT in the last 72 hours. Sepsis Labs: No results for input(s): PROCALCITON, LATICACIDVEN in the last 168 hours.  No results found for this or any previous visit (from the past 240 hour(s)).       Radiology Studies: Dg Ribs Unilateral W/chest Right  Result Date: 04/15/2017 CLINICAL DATA:  Fall with right chest wall pain EXAM: RIGHT RIBS AND CHEST - 3+ VIEW COMPARISON:  03/14/2017 FINDINGS: Single-view chest demonstrates cardiomegaly with vascular congestion. No consolidation or pleural effusion. No pneumothorax. Right rib series demonstrates probable acute right ninth and tenth rib fractures. IMPRESSION: 1. Negative for pneumothorax or pleural effusion 2. Probable acute right ninth and tenth rib fractures 3. Cardiomegaly with vascular congestion. Electronically Signed   By: Jasmine Pang M.D.   On: 04/15/2017 03:22        Scheduled Meds: . amLODipine  10 mg Oral Daily  . enoxaparin (LOVENOX) injection  40 mg Subcutaneous Q24H  .  FLUoxetine  20 mg Oral Daily  . folic acid  1 mg Oral Daily  . furosemide  40 mg Oral Daily  . lidocaine  1 patch Transdermal Q24H  . lisinopril  10 mg Oral QHS  . LORazepam  0-4 mg Intravenous Q6H   Or  . LORazepam  0-4 mg Oral Q6H  . [START ON 04/17/2017] LORazepam  0-4 mg Intravenous Q12H   Or  . [START ON 04/17/2017] LORazepam  0-4 mg Oral Q12H  . mometasone-formoterol  2 puff Inhalation BID  . nicotine  21 mg Transdermal Daily  . pantoprazole  40 mg Oral Daily  . potassium chloride SA  20 mEq Oral Daily  . risperiDONE  0.5 mg Oral BID  . sodium chloride flush  3 mL Intravenous Q12H  . spironolactone  100 mg Oral Daily  . thiamine  100 mg Oral Daily   Or  . thiamine  100 mg Intravenous Daily  . topiramate  100 mg Oral Daily   Continuous Infusions: . sodium chloride       LOS: 1 day    Time spent: 25 mins.More than 50% of that time was spent in counseling and/or coordination of care.  Meredith Leeds, MD [facility] Pager [phone]  If 7PM-7AM, please contact night-coverage [email or web address] Password [facility] 04/16/2017, 2:38 PM

## 2017-04-16 NOTE — Progress Notes (Signed)
Patient continues to be very agitated, confused in spite of Ativan 2 mg IV at 1530.  HR sustaining 160's to 180's.  Notified MD, will give another 2 mg of IV Ativan per CIWA and continue to monitor.  Safety sitter remains at bedside.

## 2017-04-16 NOTE — Progress Notes (Signed)
Patient continues to be very agitated, yelling, arguing with staff, saying she is going to leave.  This is after receiving 4 mg (total) of IV Ativan.  HR has improved to 115.  MD notified, new orders received.

## 2017-04-16 NOTE — Progress Notes (Signed)
Pt had 5 beat run of v tach. Asymptomatic and sleeping. On call provider notified. Will continue to monitor.

## 2017-04-16 NOTE — Plan of Care (Signed)
See prior notes, patient continues to withdraw.  Sitter at bedside.

## 2017-04-16 NOTE — Progress Notes (Signed)
Patient has been agitated and anxious throughout shift. Pt keeps stating that she wants to go home and not stay the night in the hospital despite education on condition and risks of leaving AMA. On call provider paged and said she is still competent enough to make her own decisions. AC was contacted to come up to discuss leaving AMA but patient was asleep at the time.

## 2017-04-17 MED ORDER — POTASSIUM CHLORIDE CRYS ER 20 MEQ PO TBCR
20.0000 meq | EXTENDED_RELEASE_TABLET | Freq: Once | ORAL | Status: AC
  Administered 2017-04-17: 20 meq via ORAL
  Filled 2017-04-17: qty 1

## 2017-04-17 MED ORDER — ACETAMINOPHEN 325 MG PO TABS
650.0000 mg | ORAL_TABLET | Freq: Four times a day (QID) | ORAL | Status: DC | PRN
  Administered 2017-04-17 – 2017-04-20 (×5): 650 mg via ORAL
  Filled 2017-04-17 (×6): qty 2

## 2017-04-17 NOTE — Progress Notes (Signed)
PROGRESS NOTE    Anne Black  UJW:[id number] DOB: 1961-01-20 DOA: 04/15/2017 PCP: Doristine Bosworth, MD   Brief Narrative:  Patient is a 57 year old female with past medical history of chronic alcohol abuse, alcoholic liver disease, liver cirrhosis, bipolar disorder, ascites, ADHD, multiple sclerosis who presented to the emergency department with complaint of right-sided chest pain.  Patient reported to have a mechanical fall at home 2 days ago.  Patient was also found to be intoxicated with alcohol on presentation. Chest x-ray done in the emergency department showed right-sided rib fracture involving the ninth and 10th rib.  Patient is currently being managed for alcohol intoxication and possible alcohol withdrawal.  Assessment & Plan:   Active Problems:   Right rib fracture  Right-sided chest pain: Secondary to fall sustaining the fracture.  Patient has fracture of ninth and 10th right-sided ribs.  Continue pain control.  Incentive spirometry. Fall was most likely associated with alcohol intoxication.  Chronic alcohol abuse: Patient intoxicated on presentation.  Alcohol level found to be in the range of 264 on presentation.  Counseled for alcohol cessation. Started on CIWA protocol.  Not on withdrawal at the moment but has very high chance of that. Continue thiamine and folic acid. Patient also benefits from follow-up with alcohol rehabilitation is an outpatient.  Will request for social work evaluation.  Patient was agitated  last night and was to be given Haldol.   COPD: Currently stable.  History of alcoholic liver disease/cirrhosis/ascites: Continue diuretics.  She is on Lasix and spironolactone.  Does not need paracentesis at present. Liver enzymes are not significantly elevated.  Hypokalemia: Supplemented with potassium.Will check levels tomorrow.     DVT prophylaxis: Lovenox Code Status: Full Family Communication: None present at the bedside Disposition Plan: Home  after resolution of alcohol intoxication/withdrawal   Consultants: None  Procedures: None  Antimicrobials: None  Subjective: Patient seen and examined the bedside this morning.  Appears sedated.  She was agitated yesterday evening and had to be given Haldol.  Objective: Vitals:   04/16/17 2118 04/17/17 0613 04/17/17 0916 04/17/17 1141  BP: 119/69 113/65  (!) 109/57  Pulse: 90 95  90  Resp: 20 20    Temp: 98.4 F (36.9 C) 98.8 F (37.1 C)    TempSrc: Oral Oral    SpO2: 100% 94% 94% 92%  Weight:  74.3 kg (163 lb 12.8 oz)    Height:       No intake or output data in the 24 hours ending 04/17/17 1303 Filed Weights   04/15/17 0916 04/17/17 5621  Weight: 73 kg (161 lb) 74.3 kg (163 lb 12.8 oz)    Examination:  General exam: Appears calm and comfortable ,Not in distress,average built,sleepy HEENT:PERRL,Oral mucosa moist, Ear/Nose normal on gross exam Respiratory system: Bilateral equal air entry, normal vesicular breath sounds, no wheezes or crackles  Cardiovascular system: S1 & S2 heard, RRR. No JVD, murmurs, rubs, gallops or clicks. Gastrointestinal system: Abdomen is nondistended, soft and nontender. No organomegaly or masses felt. Normal bowel sounds heard. Central nervous system: Alert but not oriented. Drowsy.No focal neurological deficits. Extremities: No edema, no clubbing ,no cyanosis, distal peripheral pulses palpable. Skin: No rashes, lesions or ulcers,no icterus ,no pallor MSK: Normal muscle bulk,tone ,power   Data Reviewed: I have personally reviewed following labs and imaging studies  CBC: Recent Labs  Lab 04/15/17 0607  WBC 4.4  HGB 10.0*  HCT 32.6*  MCV 89.6  PLT 154   Basic Metabolic Panel: Recent Labs  Lab 04/15/17 0607 04/16/17 0454  NA 141 136  K 3.0* 3.4*  CL 96* 99*  CO2 35* 28  GLUCOSE 120* 121*  BUN 9 8  CREATININE 0.52 0.42*  CALCIUM 8.3* 8.9  MG 1.8 1.7  PHOS 4.4 2.9   GFR: Estimated Creatinine Clearance: 73.2 mL/min (A) (by  C-G formula based on SCr of 0.42 mg/dL (L)). Liver Function Tests: Recent Labs  Lab 04/16/17 0454  AST 63*  ALT 32  ALKPHOS 131*  BILITOT 2.0*  PROT 6.6  ALBUMIN 3.5   No results for input(s): LIPASE, AMYLASE in the last 168 hours. No results for input(s): AMMONIA in the last 168 hours. Coagulation Profile: No results for input(s): INR, PROTIME in the last 168 hours. Cardiac Enzymes: No results for input(s): CKTOTAL, CKMB, CKMBINDEX, TROPONINI in the last 168 hours. BNP (last 3 results) No results for input(s): PROBNP in the last 8760 hours. HbA1C: No results for input(s): HGBA1C in the last 72 hours. CBG: No results for input(s): GLUCAP in the last 168 hours. Lipid Profile: No results for input(s): CHOL, HDL, LDLCALC, TRIG, CHOLHDL, LDLDIRECT in the last 72 hours. Thyroid Function Tests: No results for input(s): TSH, T4TOTAL, FREET4, T3FREE, THYROIDAB in the last 72 hours. Anemia Panel: No results for input(s): VITAMINB12, FOLATE, FERRITIN, TIBC, IRON, RETICCTPCT in the last 72 hours. Sepsis Labs: No results for input(s): PROCALCITON, LATICACIDVEN in the last 168 hours.  No results found for this or any previous visit (from the past 240 hour(s)).       Radiology Studies: No results found.      Scheduled Meds: . amLODipine  10 mg Oral Daily  . enoxaparin (LOVENOX) injection  40 mg Subcutaneous Q24H  . FLUoxetine  20 mg Oral Daily  . folic acid  1 mg Oral Daily  . furosemide  40 mg Oral Daily  . hydrocortisone cream   Topical TID  . lidocaine  1 patch Transdermal Q24H  . lisinopril  10 mg Oral QHS  . LORazepam  0-4 mg Intravenous Q12H   Or  . LORazepam  0-4 mg Oral Q12H  . mometasone-formoterol  2 puff Inhalation BID  . nicotine  21 mg Transdermal Daily  . potassium chloride SA  20 mEq Oral Daily  . risperiDONE  0.5 mg Oral BID  . sodium chloride flush  3 mL Intravenous Q12H  . spironolactone  100 mg Oral Daily  . thiamine  100 mg Oral Daily   Or  .  thiamine  100 mg Intravenous Daily  . topiramate  100 mg Oral Daily   Continuous Infusions: . sodium chloride       LOS: 2 days    Time spent: 25 mins.More than 50% of that time was spent in counseling and/or coordination of care.      Meredith Leeds, MD [facility] Pager [phone]  If 7PM-7AM, please contact night-coverage [email or web address] Password [email or web address] 04/17/2017, 1:03 PM

## 2017-04-17 NOTE — Plan of Care (Signed)
Patient stable during 7 a to 7 p shift, able to maintain oxygen saturation 90-92% on room air.  Patient less agitated this shift, received Ativan to cover CIWA and one additional dose for anxiety.  Mother in to visit patient, patient also spoke with daughter on the phone.  Patient very confused, does not know where she is, does not understand that she is in the hospital.   Concerned about being arrested for not showing up for court which she believes is Monday morning.  Social work consult made to investigate this.

## 2017-04-18 ENCOUNTER — Inpatient Hospital Stay (HOSPITAL_COMMUNITY): Payer: No Typology Code available for payment source

## 2017-04-18 LAB — BASIC METABOLIC PANEL
ANION GAP: 9 (ref 5–15)
BUN: 15 mg/dL (ref 6–20)
CO2: 25 mmol/L (ref 22–32)
Calcium: 9.3 mg/dL (ref 8.9–10.3)
Chloride: 100 mmol/L — ABNORMAL LOW (ref 101–111)
Creatinine, Ser: 0.53 mg/dL (ref 0.44–1.00)
Glucose, Bld: 103 mg/dL — ABNORMAL HIGH (ref 65–99)
POTASSIUM: 2.9 mmol/L — AB (ref 3.5–5.1)
SODIUM: 134 mmol/L — AB (ref 135–145)

## 2017-04-18 LAB — POTASSIUM: POTASSIUM: 4.3 mmol/L (ref 3.5–5.1)

## 2017-04-18 LAB — MAGNESIUM: MAGNESIUM: 1.7 mg/dL (ref 1.7–2.4)

## 2017-04-18 MED ORDER — IPRATROPIUM-ALBUTEROL 0.5-2.5 (3) MG/3ML IN SOLN
3.0000 mL | Freq: Four times a day (QID) | RESPIRATORY_TRACT | Status: DC
  Administered 2017-04-18 (×2): 3 mL via RESPIRATORY_TRACT
  Filled 2017-04-18 (×2): qty 3

## 2017-04-18 MED ORDER — METHYLPREDNISOLONE SODIUM SUCC 40 MG IJ SOLR
40.0000 mg | Freq: Every day | INTRAMUSCULAR | Status: DC
  Administered 2017-04-18: 40 mg via INTRAVENOUS
  Filled 2017-04-18: qty 1

## 2017-04-18 MED ORDER — POTASSIUM CHLORIDE 10 MEQ/100ML IV SOLN
10.0000 meq | INTRAVENOUS | Status: AC
  Administered 2017-04-18 (×5): 10 meq via INTRAVENOUS
  Filled 2017-04-18 (×5): qty 100

## 2017-04-18 MED ORDER — POTASSIUM CHLORIDE CRYS ER 20 MEQ PO TBCR
40.0000 meq | EXTENDED_RELEASE_TABLET | Freq: Once | ORAL | Status: AC
  Administered 2017-04-18: 40 meq via ORAL
  Filled 2017-04-18: qty 2

## 2017-04-18 NOTE — Evaluation (Signed)
Physical Therapy Evaluation Patient Details Name: Anne Black MRN: [id number] DOB: 1960-12-23 Today's Date: 04/18/2017   History of Present Illness  57 year old female with past medical history of chronic alcohol abuse, liver cirrhosis, bipolar disorder, ascites, ADHD, multiple sclerosis who presented to the emergency department with complaint of right-sided chest pain after mechanical fall at home 2 days ago.  Pt found to have fracture of ninth and 10th right-sided ribs.  Patient was also found to be intoxicated with alcohol on presentation.  Clinical Impression  Pt admitted with above diagnosis. Pt currently with functional limitations due to the deficits listed below (see PT Problem List).  Pt will benefit from skilled PT to increase their independence and safety with mobility to allow discharge to the venue listed below.  Pt only agreeable for OOB to [facility] today and returned to supine to sleep.  Nursing staff reports confusion today.  Prior level of function obtained from recent admission as pt not very talkative today.  Pt is requiring steadying assist at this time and admitted after fall at home.  Recommend 24/7 supervision upon d/c for safety.  If pt unable to have assist at home, may need SNF.     Follow Up Recommendations SNF;Supervision/Assistance - 24 hour    Equipment Recommendations  None recommended by PT    Recommendations for Other Services       Precautions / Restrictions Precautions Precautions: Fall      Mobility  Bed Mobility Overal bed mobility: Needs Assistance Bed Mobility: Supine to Sit;Sit to Supine     Supine to sit: Min assist;HOB elevated Sit to supine: Min guard   General bed mobility comments: slight assist for trunk upright, effortful and slow due to rib pain  Transfers Overall transfer level: Needs assistance Equipment used: None Transfers: Sit to/from [employer] Sit to Stand: Min assist Stand pivot transfers: Min assist        General transfer comment: pt called out to use BSC however had already urinated in bed, encouraged OOB to [facility] to empty bladder and change bed pads; assist for steadying, pt utilizing BSC armrests to self assist  Ambulation/Gait             General Gait Details: pt only wished to return to bed to sleep  Stairs            Wheelchair Mobility    Modified Rankin (Stroke Patients Only)       Balance Overall balance assessment: History of Falls;Mild deficits observed, not formally tested                                           Pertinent Vitals/Pain Pain Assessment: Faces Faces Pain Scale: Hurts even more Pain Location: ribs Pain Descriptors / Indicators: Sore Pain Intervention(s): Limited activity within patient's tolerance;Repositioned;Monitored during session    Home Living Family/patient expects to be discharged to:: Private residence Living Arrangements: Alone Available Help at Discharge: Family;Available PRN/intermittently Type of Home: House Home Access: Stairs to enter Entrance Stairs-Rails: None Entrance Stairs-Number of Steps: 2 Home Layout: Two level;Able to live on main level with bedroom/bathroom Home Equipment: Walker - 2 wheels Additional Comments: above per previous admission    Prior Function Level of Independence: Independent with assistive device(s)         Comments: Uses RW occasionally; otherwise independence.      Hand Dominance  Extremity/Trunk Assessment        Lower Extremity Assessment Lower Extremity Assessment: Generalized weakness       Communication   Communication: No difficulties  Cognition Arousal/Alertness: Awake/alert(however falls asleep quickly) Behavior During Therapy: Flat affect Overall Cognitive Status: No family/caregiver present to determine baseline cognitive functioning                                 General Comments: confused per nursing staff, following  simple commands      General Comments      Exercises     Assessment/Plan    PT Assessment Patient needs continued PT services  PT Problem List Decreased strength;Decreased mobility;Decreased activity tolerance;Decreased balance;Decreased knowledge of use of DME;Decreased cognition;Decreased safety awareness       PT Treatment Interventions Therapeutic activities;DME instruction;Gait training;Therapeutic exercise;Patient/family education;Functional mobility training;Balance training    PT Goals (Current goals can be found in the Care Plan section)  Acute Rehab PT Goals PT Goal Formulation: With patient Time For Goal Achievement: 05/02/17 Potential to Achieve Goals: Good    Frequency Min 3X/week   Barriers to discharge        Co-evaluation               AM-PAC PT "6 Clicks" Daily Activity  Outcome Measure Difficulty turning over in bed (including adjusting bedclothes, sheets and blankets)?: A Little Difficulty moving from lying on back to sitting on the side of the bed? : Unable Difficulty sitting down on and standing up from a chair with arms (e.g., wheelchair, bedside commode, etc,.)?: Unable Help needed moving to and from a bed to chair (including a wheelchair)?: A Little Help needed walking in hospital room?: A Lot Help needed climbing 3-5 steps with a railing? : Total 6 Click Score: 11    End of Session Equipment Utilized During Treatment: Oxygen Activity Tolerance: Patient limited by fatigue Patient left: in bed;with bed alarm set;with call bell/phone within reach;with nursing/sitter in room Nurse Communication: Mobility status PT Visit Diagnosis: Unsteadiness on feet (R26.81)    Time: 4742-5956 PT Time Calculation (min) (ACUTE ONLY): 10 min   Charges:   PT Evaluation $PT Eval Low Complexity: 1 Low     PT G CodesZenovia Jarred, PT, DPT 04/18/2017 Pager: [phone]  Maida Sale E 04/18/2017, 12:12 PM

## 2017-04-18 NOTE — Care Management Important Message (Signed)
Important Message  Patient Details  Name: Anne Black MRN: [id number] Date of Birth: [birth year]   Medicare Important Message Given:  Yes    Caren Macadam 04/18/2017, 1:14 PMImportant Message  Patient Details  Name: Anne Black MRN: [id number] Date of Birth: 19-Mar-1960   Medicare Important Message Given:  Yes    Caren Macadam 04/18/2017, 1:14 PM

## 2017-04-18 NOTE — Progress Notes (Addendum)
PROGRESS NOTE    [name]  ZOX:[id number] DOB: 1960-04-25 DOA: 04/15/2017 PCP: Doristine Bosworth, MD   Brief Narrative:  Patient is a 57 year old female with past medical history of chronic alcohol abuse, alcoholic liver disease, liver cirrhosis, bipolar disorder, ascites, ADHD, multiple sclerosis who presented to the emergency department with complaint of right-sided chest pain.  Patient reported to have a mechanical fall at home 2 days ago.  Patient was also found to be intoxicated with alcohol on presentation. Chest x-ray done in the emergency department showed right-sided rib fracture involving the ninth and 10th rib.  Patient is currently being managed  alcohol withdrawal.  Assessment & Plan:   Active Problems:   Right rib fracture  Right-sided chest pain: Secondary to fall sustaining the fracture.  Patient has fracture of ninth and 10th right-sided ribs.  Continue pain control.  Incentive spirometry. Fall was most likely associated with alcohol intoxication. Chest x-ray done this morning did not show any pneumonia.  Chronic alcohol abuse: Patient intoxicated on presentation.  Alcohol level found to be in the range of 264 on presentation.  Counseled for alcohol cessation. Started on CIWA protocol.  Not on withdrawal at the moment but has very high chance of that. Continue thiamine and folic acid. Patient also benefits from follow-up with alcohol rehabilitation is an outpatient.  Will request for social work evaluation. Patient less agitated today.  COPD: Bilateral expiratory wheezes auscultated today.  We will continue scheduled bronchodilators.Daily solumedrol.  Continue supplemental oxygen as needed  History of alcoholic liver disease/cirrhosis/ascites: Continue diuretics.  She is on Lasix and spironolactone.  Does not need paracentesis at present. Liver enzymes are not significantly elevated.  Hypokalemia: Supplemented with potassium.we will check levels    DVT  prophylaxis: Lovenox Code Status: Full Family Communication: None present at the bedside Disposition Plan: Home after resolution of alcohol withdrawal,Dyspnea   Consultants: None  Procedures: None  Antimicrobials: None  Subjective: Patient seen and examined the bedside this morning.  Less agitated.  More cooperative.  Complaining of shortness of breath.  Auscultated and found  to have bilateral wheezes. Objective: Vitals:   04/18/17 0626 04/18/17 0819 04/18/17 0955 04/18/17 1113  BP: (!) 102/58  (!) 101/54   Pulse: 78  83   Resp: 18     Temp: 98.2 F (36.8 C)     TempSrc: Axillary     SpO2: 92% 97%  94%  Weight:      Height:        Intake/Output Summary (Last 24 hours) at 04/18/2017 1150 Last data filed at 04/18/2017 1000 Gross per 24 hour  Intake 120 ml  Output -  Net 120 ml   Filed Weights   04/15/17 0916 04/17/17 0613  Weight: 73 kg (161 lb) 74.3 kg (163 lb 12.8 oz)    Examination:  General exam: Appears calm and comfortable ,Not in distress,average built,drowsy HEENT:PERRL,Oral mucosa moist, Ear/Nose normal on gross exam Respiratory system: Bilateral expiratpry wheezes  cardiovascular system: S1 & S2 heard, RRR. No JVD, murmurs, rubs, gallops or clicks. Gastrointestinal system: Abdomen is nondistended, soft and nontender. No organomegaly or masses felt. Normal bowel sounds heard. Central nervous system: Alert and awake . No focal neurological deficits. Extremities: No edema, no clubbing ,no cyanosis, distal peripheral pulses palpable. Skin: No rashes, lesions or ulcers,no icterus ,no pallor   Data Reviewed: I have personally reviewed following labs and imaging studies  CBC: Recent Labs  Lab 04/15/17 0607  WBC 4.4  HGB 10.0*  HCT 32.6*  MCV 89.6  PLT 154   Basic Metabolic Panel: Recent Labs  Lab 04/15/17 0607 04/16/17 0454 04/18/17 0457  NA 141 136 134*  K 3.0* 3.4* 2.9*  CL 96* 99* 100*  CO2 35* 28 25  GLUCOSE 120* 121* 103*  BUN 9 8 15     CREATININE 0.52 0.42* 0.53  CALCIUM 8.3* 8.9 9.3  MG 1.8 1.7 1.7  PHOS 4.4 2.9  --    GFR: Estimated Creatinine Clearance: 73.2 mL/min (by C-G formula based on SCr of 0.53 mg/dL). Liver Function Tests: Recent Labs  Lab 04/16/17 0454  AST 63*  ALT 32  ALKPHOS 131*  BILITOT 2.0*  PROT 6.6  ALBUMIN 3.5   No results for input(s): LIPASE, AMYLASE in the last 168 hours. No results for input(s): AMMONIA in the last 168 hours. Coagulation Profile: No results for input(s): INR, PROTIME in the last 168 hours. Cardiac Enzymes: No results for input(s): CKTOTAL, CKMB, CKMBINDEX, TROPONINI in the last 168 hours. BNP (last 3 results) No results for input(s): PROBNP in the last 8760 hours. HbA1C: No results for input(s): HGBA1C in the last 72 hours. CBG: No results for input(s): GLUCAP in the last 168 hours. Lipid Profile: No results for input(s): CHOL, HDL, LDLCALC, TRIG, CHOLHDL, LDLDIRECT in the last 72 hours. Thyroid Function Tests: No results for input(s): TSH, T4TOTAL, FREET4, T3FREE, THYROIDAB in the last 72 hours. Anemia Panel: No results for input(s): VITAMINB12, FOLATE, FERRITIN, TIBC, IRON, RETICCTPCT in the last 72 hours. Sepsis Labs: No results for input(s): PROCALCITON, LATICACIDVEN in the last 168 hours.  No results found for this or any previous visit (from the past 240 hour(s)).       Radiology Studies: Dg Chest 1 View  Result Date: 04/18/2017 CLINICAL DATA:  Follow-up rib fractures. History of chronic alcohol abuse, alcoholic liver disease, possible alcohol withdrawal. EXAM: CHEST 1 VIEW COMPARISON:  Plain film of the chest and right ribs dated 04/15/2017. FINDINGS: Mild cardiomegaly is stable. Overall cardiomediastinal silhouette is stable. Lungs are clear. The displaced fractures of the posterior right eighth and ninth ribs appear grossly stable, better seen on earlier rib films. No new osseous abnormality. IMPRESSION: 1. Lungs are clear. No pneumonia, pleural  effusion or pneumothorax seen. 2. Displaced fractures of the posterior right ribs appear grossly stable, better seen on earlier rib films. Electronically Signed   By: Bary Richard M.D.   On: 04/18/2017 11:33        Scheduled Meds: . amLODipine  10 mg Oral Daily  . enoxaparin (LOVENOX) injection  40 mg Subcutaneous Q24H  . FLUoxetine  20 mg Oral Daily  . folic acid  1 mg Oral Daily  . furosemide  40 mg Oral Daily  . hydrocortisone cream   Topical TID  . ipratropium-albuterol  3 mL Nebulization Q6H  . lidocaine  1 patch Transdermal Q24H  . lisinopril  10 mg Oral QHS  . LORazepam  0-4 mg Intravenous Q12H   Or  . LORazepam  0-4 mg Oral Q12H  . mometasone-formoterol  2 puff Inhalation BID  . nicotine  21 mg Transdermal Daily  . potassium chloride SA  20 mEq Oral Daily  . potassium chloride  40 mEq Oral Once  . risperiDONE  0.5 mg Oral BID  . sodium chloride flush  3 mL Intravenous Q12H  . spironolactone  100 mg Oral Daily  . thiamine  100 mg Oral Daily   Or  . thiamine  100 mg Intravenous Daily  .  topiramate  100 mg Oral Daily   Continuous Infusions: . sodium chloride    . potassium chloride 10 mEq (04/18/17 1020)     LOS: 3 days    Time spent: 25 mins.More than 50% of that time was spent in counseling and/or coordination of care.      Meredith Leeds, MD [facility] Pager [phone]  If 7PM-7AM, please contact night-coverage [email or web address] Password [facility] 04/18/2017, 11:50 AM

## 2017-04-18 NOTE — Progress Notes (Signed)
CSW acknowledged consult for "Chronic alcoholism. Next help with rehabilitation" and consult for "patient's court date". Patient currently oriented to person and place and not able to participate in assessment. CSW will follow up with patient tomorrow to see if patient is oriented to participate in assessment.   Celso Sickle, [place] Clinical Social Worker [facility] Cell#: [phone]

## 2017-04-19 DIAGNOSIS — J449 Chronic obstructive pulmonary disease, unspecified: Secondary | ICD-10-CM

## 2017-04-19 DIAGNOSIS — F10231 Alcohol dependence with withdrawal delirium: Secondary | ICD-10-CM

## 2017-04-19 LAB — BASIC METABOLIC PANEL
Anion gap: 10 (ref 5–15)
BUN: 10 mg/dL (ref 6–20)
CHLORIDE: 105 mmol/L (ref 101–111)
CO2: 20 mmol/L — AB (ref 22–32)
Calcium: 9.1 mg/dL (ref 8.9–10.3)
Creatinine, Ser: 0.4 mg/dL — ABNORMAL LOW (ref 0.44–1.00)
GFR calc Af Amer: >60 mL/min (ref >60)
GFR calc non Af Amer: >60 mL/min (ref >60)
Glucose, Bld: 99 mg/dL (ref 65–99)
POTASSIUM: 3.8 mmol/L (ref 3.5–5.1)
Sodium: 135 mmol/L (ref 135–145)

## 2017-04-19 MED ORDER — LORAZEPAM 2 MG/ML IJ SOLN
1.0000 mg | Freq: Four times a day (QID) | INTRAMUSCULAR | Status: DC | PRN
  Administered 2017-04-20: 1 mg via INTRAVENOUS
  Filled 2017-04-19: qty 1

## 2017-04-19 MED ORDER — IPRATROPIUM-ALBUTEROL 0.5-2.5 (3) MG/3ML IN SOLN
3.0000 mL | Freq: Three times a day (TID) | RESPIRATORY_TRACT | Status: DC
  Administered 2017-04-19 (×3): 3 mL via RESPIRATORY_TRACT
  Filled 2017-04-19 (×3): qty 3

## 2017-04-19 MED ORDER — LORAZEPAM 1 MG PO TABS
1.0000 mg | ORAL_TABLET | Freq: Four times a day (QID) | ORAL | Status: DC | PRN
  Administered 2017-04-19 – 2017-04-20 (×5): 1 mg via ORAL
  Filled 2017-04-19 (×5): qty 1

## 2017-04-19 MED ORDER — PREDNISONE 20 MG PO TABS
40.0000 mg | ORAL_TABLET | Freq: Every day | ORAL | Status: DC
  Administered 2017-04-19 – 2017-04-21 (×3): 40 mg via ORAL
  Filled 2017-04-19 (×3): qty 2

## 2017-04-19 NOTE — Progress Notes (Signed)
Telesitter discontinued as pt remains in bed and without agitation.

## 2017-04-19 NOTE — Progress Notes (Signed)
CSW completed patient's PASRR, under manual review. MUST ID [id number].   CSW will continue to follow and assist with discharge planning.  Celso Sickle, [place] Clinical Social Worker [facility] Cell#: [phone]

## 2017-04-19 NOTE — [place] FL2 (Signed)
[place] MEDICAID FL2 LEVEL OF CARE SCREENING TOOL     IDENTIFICATION  Patient Name: Anne Black Birthdate: 07-26-1960 Sex: female Admission Date (Current Location): 04/15/2017  [facility] and [place] Number:  Producer, television/film/video and Address:  [facility],  [address], [place] [address]      Provider Number: [id number]  Attending Physician Name and Address:  Meredith Leeds, MD  Relative Name and Phone Number:       Current Level of Care: Hospital Recommended Level of Care: Skilled Nursing Facility Prior Approval Number:    Date Approved/Denied:   PASRR Number:    Discharge Plan: SNF    Current Diagnoses: Patient Active Problem List   Diagnosis Date Noted  . COPD (chronic obstructive pulmonary disease) (HCC) 04/19/2017  . Right rib fracture 04/15/2017  . Abdominal wall cellulitis 12/30/2016  . S/P exploratory laparotomy 12/30/2016  . Distal radius fracture, left 09/09/2016  . Thrombocytopenia (HCC) 09/09/2016  . Chest x-ray abnormality   . Umbilical hernia without obstruction and without gangrene 05/12/2016  . Itching 02/24/2016  . Ventral hernia without obstruction or gangrene 02/24/2016  . Palliative care encounter   . Ascites   . Tachypnea   . Typical atrial flutter (HCC)   . Hypomagnesemia   . Hypoxia   . Dehydration 01/22/2016  . Low back ache 12/30/2015  . Non-intractable vomiting with nausea   . Alcohol abuse with alcohol-induced mood disorder (HCC) 12/15/2015  . Acute alcoholism (HCC)   . Abdominal distension 11/04/2015  . Alcohol abuse   . Dyspnea   . Acute respiratory failure (HCC) 10/15/2015  . Ascites due to alcoholic cirrhosis (HCC) 10/15/2015  . Osteoporosis   . Bipolar I disorder (HCC)   . Multiple sclerosis (HCC)   . Alcoholic cirrhosis of liver with ascites (HCC) 08/22/2015  . Bipolar disorder, current episode mixed, moderate (HCC) 08/01/2015  . Alcohol use disorder, severe, dependence (HCC) 07/31/2015  .  Macrocytic anemia- due to alcohol abuse with normal B12 & folate levels 05/31/2015  . Severe protein-calorie malnutrition (HCC) 05/31/2015  . Hypokalemia 05/26/2015  . Encephalopathy, hepatic (HCC) 05/26/2015  . Alcohol withdrawal (HCC) 05/18/2015  . Abdominal pain 05/18/2015  . Anxiety 05/18/2015  . UTI (lower urinary tract infection) 05/18/2015  . Stimulant abuse (HCC) 12/27/2014  . Nicotine abuse 12/27/2014  . Hyperprolactinemia (HCC) 11/15/2014  . Dyslipidemia   . Tobacco abuse 01/23/2014  . Noncompliance with therapeutic plan 04/04/2013  . Unspecified hereditary and idiopathic peripheral neuropathy 05/01/2012  . GERD (gastroesophageal reflux disease) 05/01/2012  . Osteoarthrosis, unspecified whether generalized or localized, involving lower leg 05/01/2012  . Hyponatremia 07/01/2011    Orientation RESPIRATION BLADDER Height & Weight     Self  Normal Continent Weight: 163 lb 12.8 oz (74.3 kg) Height:  5\' 2"  (157.5 cm)  BEHAVIORAL SYMPTOMS/MOOD NEUROLOGICAL BOWEL NUTRITION STATUS      Continent Diet(Heart)  AMBULATORY STATUS COMMUNICATION OF NEEDS Skin   Extensive Assist Verbally Other (Comment)(Incision(Closed)Abdomen)                       Personal Care Assistance Level of Assistance  Bathing, Feeding, Dressing Bathing Assistance: Maximum assistance Feeding assistance: Independent Dressing Assistance: Maximum assistance     Functional Limitations Info  Sight, Hearing, Speech Sight Info: Adequate Hearing Info: Adequate Speech Info: Adequate    SPECIAL CARE FACTORS FREQUENCY  PT (By licensed PT), OT (By licensed OT)     PT Frequency: 5x/week OT Frequency: 5x/week  Contractures Contractures Info: Not present    Additional Factors Info  Code Status, Allergies, Psychotropic Code Status Info: Full Code Allergies Info: NKA Psychotropic Info: Risperidone         Current Medications (04/19/2017):  This is the current hospital active  medication list Current Facility-Administered Medications  Medication Dose Route Frequency Provider Last Rate Last Dose  . 0.9 %  sodium chloride infusion  250 mL Intravenous PRN Berton Mount I, MD      . acetaminophen (TYLENOL) tablet 650 mg  650 mg Oral Q6H PRN Meredith Leeds, MD   650 mg at 04/18/17 1951  . amLODipine (NORVASC) tablet 10 mg  10 mg Oral Daily Berton Mount I, MD   10 mg at 04/19/17 1049  . enoxaparin (LOVENOX) injection 40 mg  40 mg Subcutaneous Q24H Berton Mount I, MD   40 mg at 04/19/17 1049  . FLUoxetine (PROZAC) capsule 20 mg  20 mg Oral Daily Berton Mount I, MD   20 mg at 04/19/17 1049  . fluticasone (FLONASE) 50 MCG/ACT nasal spray 1 spray  1 spray Each Nare Daily PRN Berton Mount I, MD      . folic acid (FOLVITE) tablet 1 mg  1 mg Oral Daily Berton Mount I, MD   1 mg at 04/19/17 1049  . furosemide (LASIX) tablet 40 mg  40 mg Oral Daily Berton Mount I, MD   40 mg at 04/19/17 1049  . hydrocortisone cream 1 %   Topical TID Adhikari Bk, Amrit, MD      . ipratropium-albuterol (DUONEB) 0.5-2.5 (3) MG/3ML nebulizer solution 3 mL  3 mL Nebulization Q6H PRN Berton Mount I, MD   3 mL at 04/18/17 1113  . ipratropium-albuterol (DUONEB) 0.5-2.5 (3) MG/3ML nebulizer solution 3 mL  3 mL Nebulization TID Salli Quarry, Amrit, MD   3 mL at 04/19/17 1436  . lidocaine (LIDODERM) 5 % 1 patch  1 patch Transdermal Q24H Antony Madura, PA-C   1 patch at 04/19/17 0345  . lisinopril (PRINIVIL,ZESTRIL) tablet 10 mg  10 mg Oral QHS Berton Mount I, MD   10 mg at 04/18/17 2252  . LORazepam (ATIVAN) tablet 1 mg  1 mg Oral Q6H PRN Meredith Leeds, MD       Or  . LORazepam (ATIVAN) injection 1 mg  1 mg Intravenous Q6H PRN Adhikari Bk, Amrit, MD      . MUSCLE RUB CREA 1 application  1 application Topical PRN Berton Mount I, MD      . nicotine (NICODERM CQ - dosed in mg/24 hours) patch 21 mg  21 mg Transdermal Daily Berton Mount I, MD   21 mg at  04/19/17 1053  . ondansetron (ZOFRAN) injection 4 mg  4 mg Intravenous Q6H PRN Berton Mount I, MD   4 mg at 04/17/17 0953  . oxyCODONE (Oxy IR/ROXICODONE) immediate release tablet 5 mg  5 mg Oral Q6H PRN Berton Mount I, MD   5 mg at 04/19/17 1222  . potassium chloride SA (K-DUR,KLOR-CON) CR tablet 20 mEq  20 mEq Oral Daily Berton Mount I, MD   20 mEq at 04/19/17 1049  . predniSONE (DELTASONE) tablet 40 mg  40 mg Oral Q breakfast Salli Quarry, Amrit, MD   40 mg at 04/19/17 1222  . risperiDONE (RISPERDAL) tablet 0.5 mg  0.5 mg Oral BID Berton Mount I, MD   0.5 mg at 04/19/17 1049  . sodium chloride flush (NS) 0.9 % injection 3 mL  3 mL Intravenous  Q12H Berton Mount I, MD   3 mL at 04/18/17 0955  . sodium chloride flush (NS) 0.9 % injection 3 mL  3 mL Intravenous PRN Berton Mount I, MD      . spironolactone (ALDACTONE) tablet 100 mg  100 mg Oral Daily Berton Mount I, MD   100 mg at 04/19/17 1049  . thiamine (VITAMIN B-1) tablet 100 mg  100 mg Oral Daily Antony Madura, PA-C   100 mg at 04/19/17 1049   Or  . thiamine (B-1) injection 100 mg  100 mg Intravenous Daily Antony Madura, PA-C   100 mg at 04/15/17 1034  . topiramate (TOPAMAX) tablet 100 mg  100 mg Oral Daily Berton Mount I, MD   100 mg at 04/19/17 1049     Discharge Medications: Please see discharge summary for a list of discharge medications.  Relevant Imaging Results:  Relevant Lab Results:   Additional Information SS#: [id number]  Antionette Poles, LCSW

## 2017-04-19 NOTE — Progress Notes (Signed)
PROGRESS NOTE    RIM THATCH  ZOX:[id number] DOB: March 20, 1960 DOA: 04/15/2017 PCP: Doristine Bosworth, MD   Brief Narrative:  Patient is a 57 year old female with past medical history of chronic alcohol abuse, alcoholic liver disease, liver cirrhosis, bipolar disorder, ascites, ADHD, multiple sclerosis,nicotine abuse who presented to the emergency department with complaint of right-sided chest pain.  Patient reported to have a mechanical fall at home 2 days ago.  Patient was also found to be intoxicated with alcohol on presentation. Chest x-ray done in the emergency department showed right-sided rib fracture involving the ninth and 10th rib.  Patient is currently being managed  alcohol withdrawal. She has also been evaluated  by physical therapy and recommended skilled nursing facility on discharge.  Assessment & Plan:   Principal Problem:   Alcohol withdrawal (HCC) Active Problems:   Tobacco abuse   Alcohol use disorder, severe, dependence (HCC)   Alcoholic cirrhosis of liver with ascites (HCC)   Right rib fracture   COPD (chronic obstructive pulmonary disease) (HCC)  Alcohol withdrawal: Continue to monitor CIWA score.Follow the protocol.Less agitated last few days.  Right-sided chest pain: Secondary to fall sustaining the fracture.  Patient has fracture of ninth and 10th right-sided ribs.  Continue pain control.  Incentive spirometry. Fall was most likely associated with alcohol intoxication. Chest x-ray done yesterday did not show any pneumonia.  Chronic alcohol abuse: Patient intoxicated on presentation.  Alcohol level found to be in the range of 264 on presentation.  Counseled for alcohol cessation. Started on CIWA protocol.  Continue thiamine and folic acid. Patient also benefits from follow-up with alcohol rehabilitation is an outpatient.  Will request for social work evaluation. Patient less agitated today.  COPD: Bilateral expiratory wheezes auscultated .  We will continue  scheduled steroids.  Continue supplemental oxygen as needed. Patient continues to smoke.  Has 20 pack year smoking history.  History of alcoholic liver disease/cirrhosis/ascites: Continue diuretics.  She is on Lasix and spironolactone.  Does not need paracentesis at present. Liver enzymes are not significantly elevated.  Hypokalemia: Supplemented with potassium.we will check levels periodically.    DVT prophylaxis: Lovenox Code Status: Full Family Communication: None present at the bedside Disposition Plan: SNF after resolution of alcohol withdrawal   Consultants: None  Procedures: None  Antimicrobials: None  Subjective: Patient seen and examined the pressure this morning.  Was found to be drowsy.  Her CIWA score was noticed to be more than 10 this morning.  Objective: Vitals:   04/19/17 0655 04/19/17 0803 04/19/17 1300 04/19/17 1437  BP: 106/62  (!) 92/50   Pulse: 78  88   Resp: 20  20   Temp: 99.3 F (37.4 C)  99.2 F (37.3 C)   TempSrc: Axillary  Axillary   SpO2: 95% 92% 91% 91%  Weight:      Height:        Intake/Output Summary (Last 24 hours) at 04/19/2017 1446 Last data filed at 04/19/2017 1200 Gross per 24 hour  Intake 480 ml  Output 200 ml  Net 280 ml   Filed Weights   04/15/17 0916 04/17/17 0613  Weight: 73 kg (161 lb) 74.3 kg (163 lb 12.8 oz)    Examination:  General exam: Appears drowsy ,Not in distress,average built HEENT:PERRL,Oral mucosa moist, Ear/Nose normal on gross exam Respiratory system: Bilateral occasional wheezes  cardiovascular system: S1 & S2 heard, RRR. No JVD, murmurs, rubs, gallops or clicks. Gastrointestinal system: Abdomen is nondistended, soft and nontender. No organomegaly or masses  felt. Normal bowel sounds heard. Central nervous system: Sleepy ,droswy,confused. No focal neurological deficits. Extremities: No edema, no clubbing ,no cyanosis, distal peripheral pulses palpable. Skin: No rashes, lesions or ulcers,no icterus ,no  pallor MSK: Normal muscle bulk,tone ,power     Data Reviewed: I have personally reviewed following labs and imaging studies  CBC: Recent Labs  Lab 04/15/17 0607  WBC 4.4  HGB 10.0*  HCT 32.6*  MCV 89.6  PLT 154   Basic Metabolic Panel: Recent Labs  Lab 04/15/17 0607 04/16/17 0454 04/18/17 0457 04/18/17 2153 04/19/17 0525  NA 141 136 134*  --  135  K 3.0* 3.4* 2.9* 4.3 3.8  CL 96* 99* 100*  --  105  CO2 35* 28 25  --  20*  GLUCOSE 120* 121* 103*  --  99  BUN 9 8 15   --  10  CREATININE 0.52 0.42* 0.53  --  0.40*  CALCIUM 8.3* 8.9 9.3  --  9.1  MG 1.8 1.7 1.7  --   --   PHOS 4.4 2.9  --   --   --    GFR: Estimated Creatinine Clearance: 73.2 mL/min (A) (by C-G formula based on SCr of 0.4 mg/dL (L)). Liver Function Tests: Recent Labs  Lab 04/16/17 0454  AST 63*  ALT 32  ALKPHOS 131*  BILITOT 2.0*  PROT 6.6  ALBUMIN 3.5   No results for input(s): LIPASE, AMYLASE in the last 168 hours. No results for input(s): AMMONIA in the last 168 hours. Coagulation Profile: No results for input(s): INR, PROTIME in the last 168 hours. Cardiac Enzymes: No results for input(s): CKTOTAL, CKMB, CKMBINDEX, TROPONINI in the last 168 hours. BNP (last 3 results) No results for input(s): PROBNP in the last 8760 hours. HbA1C: No results for input(s): HGBA1C in the last 72 hours. CBG: No results for input(s): GLUCAP in the last 168 hours. Lipid Profile: No results for input(s): CHOL, HDL, LDLCALC, TRIG, CHOLHDL, LDLDIRECT in the last 72 hours. Thyroid Function Tests: No results for input(s): TSH, T4TOTAL, FREET4, T3FREE, THYROIDAB in the last 72 hours. Anemia Panel: No results for input(s): VITAMINB12, FOLATE, FERRITIN, TIBC, IRON, RETICCTPCT in the last 72 hours. Sepsis Labs: No results for input(s): PROCALCITON, LATICACIDVEN in the last 168 hours.  No results found for this or any previous visit (from the past 240 hour(s)).       Radiology Studies: Dg Chest 1  View  Result Date: 04/18/2017 CLINICAL DATA:  Follow-up rib fractures. History of chronic alcohol abuse, alcoholic liver disease, possible alcohol withdrawal. EXAM: CHEST 1 VIEW COMPARISON:  Plain film of the chest and right ribs dated 04/15/2017. FINDINGS: Mild cardiomegaly is stable. Overall cardiomediastinal silhouette is stable. Lungs are clear. The displaced fractures of the posterior right eighth and ninth ribs appear grossly stable, better seen on earlier rib films. No new osseous abnormality. IMPRESSION: 1. Lungs are clear. No pneumonia, pleural effusion or pneumothorax seen. 2. Displaced fractures of the posterior right ribs appear grossly stable, better seen on earlier rib films. Electronically Signed   By: Bary Richard M.D.   On: 04/18/2017 11:33        Scheduled Meds: . amLODipine  10 mg Oral Daily  . enoxaparin (LOVENOX) injection  40 mg Subcutaneous Q24H  . FLUoxetine  20 mg Oral Daily  . folic acid  1 mg Oral Daily  . furosemide  40 mg Oral Daily  . hydrocortisone cream   Topical TID  . ipratropium-albuterol  3 mL Nebulization TID  .  lidocaine  1 patch Transdermal Q24H  . lisinopril  10 mg Oral QHS  . nicotine  21 mg Transdermal Daily  . potassium chloride SA  20 mEq Oral Daily  . predniSONE  40 mg Oral Q breakfast  . risperiDONE  0.5 mg Oral BID  . sodium chloride flush  3 mL Intravenous Q12H  . spironolactone  100 mg Oral Daily  . thiamine  100 mg Oral Daily   Or  . thiamine  100 mg Intravenous Daily  . topiramate  100 mg Oral Daily   Continuous Infusions: . sodium chloride       LOS: 4 days    Time spent: 25 mins.   Meredith Leeds, MD [facility] Pager [phone]  If 7PM-7AM, please contact night-coverage [email or web address] Password [facility] 04/19/2017, 2:46 PM

## 2017-04-19 NOTE — Clinical Social Work Note (Signed)
Clinical Social Work Assessment  Patient Details  Name: Anne Black MRN: [id number] Date of Birth: 05/16/1960  Date of referral:  04/19/17               Reason for consult:  Facility Placement                Permission sought to share information with:    Permission granted to share information::     Name::        Agency::     Relationship::     Contact Information:     Housing/Transportation Living arrangements for the past 2 months:  Single Family Home Source of Information:  Adult Children, Parent Patient Interpreter Needed:  None Criminal Activity/Legal Involvement Pertinent to Current Situation/Hospitalization:  No - Comment as needed Significant Relationships:  Adult Children, Parents Lives with:  Self Do you feel safe going back to the place where you live?  (PT recommending SNF; 24 hour supervision) Need for family participation in patient care:  Yes (Comment)  Care giving concerns:  Patient from home alone. Per patient's mother, patient does not care for herself in the home or clean up. Patient's daughter reported that patient is scared to bathe because patient is scared of falling. Patient's daughter reported that they tried to assist patient with bathing, patient refused. Patient's mother reported that patient does not allow anyone in her home, noting that patient declines home health assistance. PT recommending SNF/24 hour supervision. CSW consulted to speak with patient regarding etoh abuse and court date, patient currently orietned x person and unable to participate in assessment.  Social Worker assessment / plan:  CSW spoke with patient's mother to obtain collateral information. CSW explained to patient's mother that CSW will have to speak with patient's next of kin (adult children) to discuss discharge planning but CSW can obtain information from patient's mother, patient's mother verbalized understanding. Patient's mother reported that patient is from home alone and  that patient does not usually complete ADLs. Patient's mother reported that patient has a history of mental illness and has been drinking for the past five years. Patient's mother reported that patient has 5 children (a set of triplets that are adults and a set of twins that are in high school). Patient's mother provided CSW with contact information for her adult children. Anne Black [phone]; Anne Black [phone];Anne Black [phone]). Patient's mother reported that patient's daughter Anne Black may be the best contact because she lives locally. Patient's mother reported that patient has been in and out of hospital numerous times and that patient refuses home health services and allowing people into her home. Patient's mother reported that patient has been to SNF multiple times in the past. Patient's mother reported that patient has a small support system because patient has burned bridges with many people. CSW thanked patient's mother for information provided and agreed to follow up with patient's next of kin.  CSW contacted patient's daughter Anne Black, no answer. CSW left voicemail requesting return phone call.  CSW contacted patient's daughter Anne Black and discussed patient's PT recommendation for SNF/24 hour supervision. Patient's daughter reported that she is agreeable to SNF. Patient's daughter reported that patient's daughter Anne Black lives locally and may be the person to contact regarding patient's discharge planning. CSW informed patient's daughter that CSW left voicemail for [employer].   CSW received return call from patient's daughter Anne Black, CSW introduced self and explained PT recommendation for SNF. Patient's daughter reported that she is  agreeable and informed CSW that patient does not have support to provide 24 hour care.   CSW will complete FL2 and follow up with bed offers.  CSW will continue to follow and assist with discharge planning.  Employment  status:  Disabled (Comment on whether or not currently receiving Disability) Insurance information:  Medicare, Managed Care PT Recommendations:  Skilled Nursing Facility, 24 Hour Supervision Information / Referral to community resources:  Skilled Nursing Facility  Patient/Family's Response to care:  Patient's Art therapist of CSW assistance with discharge planning.   Patient/Family's Understanding of and Emotional Response to Diagnosis, Current Treatment, and Prognosis:  Patient oriented x person and unable to participate in assessment. Patient's family supportive, patient's daughters verbalized plan for patient to dc to SNF for ST rehab.   Emotional Assessment Appearance:    Attitude/Demeanor/Rapport:  Unable to Assess Affect (typically observed):  Unable to Assess Orientation:  Oriented to Self Alcohol / Substance use:  Alcohol Use Psych involvement (Current and /or in the community):  No (Comment)  Discharge Needs  Concerns to be addressed:  Care Coordination Readmission within the last 30 days:  No Current discharge risk:  Physical Impairment, Lives alone, Substance Abuse Barriers to Discharge:  Continued Medical Work up   [employer], LCSW 04/19/2017, 12:46 PM

## 2017-04-20 DIAGNOSIS — K7031 Alcoholic cirrhosis of liver with ascites: Secondary | ICD-10-CM

## 2017-04-20 DIAGNOSIS — J449 Chronic obstructive pulmonary disease, unspecified: Secondary | ICD-10-CM

## 2017-04-20 DIAGNOSIS — F1020 Alcohol dependence, uncomplicated: Secondary | ICD-10-CM

## 2017-04-20 DIAGNOSIS — Z720 Tobacco use: Secondary | ICD-10-CM

## 2017-04-20 LAB — CBC WITH DIFFERENTIAL/PLATELET
BLASTS: 0 %
Band Neutrophils: 0 %
Basophils Absolute: 0.1 10*3/uL (ref 0.0–0.1)
Basophils Relative: 1 %
EOS PCT: 2 %
Eosinophils Absolute: 0.1 10*3/uL (ref 0.0–0.7)
HEMATOCRIT: 35.3 % — AB (ref 36.0–46.0)
Hemoglobin: 10.8 g/dL — ABNORMAL LOW (ref 12.0–15.0)
Lymphocytes Relative: 19 %
Lymphs Abs: 1.2 10*3/uL (ref 0.7–4.0)
MCH: 26.9 pg (ref 26.0–34.0)
MCHC: 30.6 g/dL (ref 30.0–36.0)
MCV: 87.8 fL (ref 78.0–100.0)
MYELOCYTES: 0 %
Metamyelocytes Relative: 0 %
Monocytes Absolute: 0.6 10*3/uL (ref 0.1–1.0)
Monocytes Relative: 9 %
NEUTROS PCT: 69 %
NRBC: 0 /100{WBCs}
Neutro Abs: 4.3 10*3/uL (ref 1.7–7.7)
PROMYELOCYTES ABS: 0 %
Platelets: 112 10*3/uL — ABNORMAL LOW (ref 150–400)
RBC: 4.02 MIL/uL (ref 3.87–5.11)
RDW: 18.3 % — AB (ref 11.5–15.5)
WBC: 6.3 10*3/uL (ref 4.0–10.5)

## 2017-04-20 LAB — BASIC METABOLIC PANEL
Anion gap: 9 (ref 5–15)
BUN: 13 mg/dL (ref 6–20)
CALCIUM: 8.9 mg/dL (ref 8.9–10.3)
CO2: 20 mmol/L — AB (ref 22–32)
Chloride: 107 mmol/L (ref 101–111)
Creatinine, Ser: 0.45 mg/dL (ref 0.44–1.00)
GFR calc Af Amer: >60 mL/min (ref >60)
GFR calc non Af Amer: >60 mL/min (ref >60)
GLUCOSE: 92 mg/dL (ref 65–99)
Potassium: 3.4 mmol/L — ABNORMAL LOW (ref 3.5–5.1)
Sodium: 136 mmol/L (ref 135–145)

## 2017-04-20 LAB — MAGNESIUM: Magnesium: 1.7 mg/dL (ref 1.7–2.4)

## 2017-04-20 LAB — PHOSPHORUS: Phosphorus: 3.7 mg/dL (ref 2.5–4.6)

## 2017-04-20 MED ORDER — POTASSIUM CHLORIDE CRYS ER 20 MEQ PO TBCR
40.0000 meq | EXTENDED_RELEASE_TABLET | Freq: Two times a day (BID) | ORAL | Status: AC
  Administered 2017-04-20 (×2): 40 meq via ORAL
  Filled 2017-04-20 (×2): qty 2

## 2017-04-20 MED ORDER — LIDOCAINE 5 % EX PTCH: 1.0000 | MEDICATED_PATCH | CUTANEOUS | 0 refills | Status: DC

## 2017-04-20 MED ORDER — HYDROCORTISONE 1 % EX CREA: TOPICAL_CREAM | Freq: Three times a day (TID) | CUTANEOUS | 0 refills | Status: DC

## 2017-04-20 MED ORDER — OXYCODONE HCL 5 MG PO TABS: 5.0000 mg | ORAL_TABLET | Freq: Four times a day (QID) | ORAL | 0 refills | Status: DC | PRN

## 2017-04-20 MED ORDER — THIAMINE HCL 100 MG PO TABS: 100.0000 mg | ORAL_TABLET | Freq: Every day | ORAL | 0 refills | Status: DC

## 2017-04-20 MED ORDER — NICOTINE 21 MG/24HR TD PT24: 21.0000 mg | MEDICATED_PATCH | Freq: Every day | TRANSDERMAL | 0 refills | Status: DC

## 2017-04-20 MED ORDER — MUSCLE RUB 10-15 % EX CREA: 1.0000 "application " | TOPICAL_CREAM | CUTANEOUS | 0 refills | Status: DC | PRN

## 2017-04-20 MED ORDER — IPRATROPIUM-ALBUTEROL 0.5-2.5 (3) MG/3ML IN SOLN
3.0000 mL | Freq: Two times a day (BID) | RESPIRATORY_TRACT | Status: DC
  Administered 2017-04-20 – 2017-04-21 (×2): 3 mL via RESPIRATORY_TRACT
  Filled 2017-04-20 (×4): qty 3

## 2017-04-20 MED ORDER — PREDNISONE 20 MG PO TABS: 40.0000 mg | ORAL_TABLET | Freq: Every day | ORAL | 0 refills | Status: DC

## 2017-04-20 MED ORDER — FOLIC ACID 1 MG PO TABS: 1.0000 mg | ORAL_TABLET | Freq: Every day | ORAL | Status: DC

## 2017-04-20 NOTE — Discharge Summary (Addendum)
Physician Discharge Summary  Anne Black:[id number] DOB: [birth year] DOA: 04/15/2017  PCP: Doristine Bosworth, MD  Admit date: 04/15/2017 Discharge date: 04/21/2017  Admitted From:  Home Disposition: SNF  ADDENDUM: Patient was medically stable to D/C yesterday, however unfortunately did not get Insurance Authorization yesterday. Per my discussion with the Social Worker she has English as a second language teacher today and will be Discharged. Overnight patient became agitated and had to be given Haldol. This AM she is improved and alert. She did complain of some Indigestion so her Protonix was restarted. Repeat Lab work showed that she was still Hypokalemic so it was replete prior to D/C. Patient is stable to D/C to SNF today.   Recommendations for Outpatient Follow-up:  1. Follow up with PCP in 1-2 weeks 2. Please obtain CMP/CBC, Mag, Phos in one week 3. Please follow up on the following pending results:  Home Health: No Equipment/Devices: None recommended by PT  Discharge Condition: Stable   CODE STATUS: FULL CODE Diet recommendation: Heart Healthy Diet   Brief/Interim Summary: Patient is a 57 year old female with past medical history of chronic alcohol abuse, alcoholic liver disease, liver cirrhosis, bipolar disorder, ascites, ADHD, multiple sclerosis, nicotine abuse who presented to the emergency department with complaint of right-sided chest pain.  Patient reported to have a mechanical fall at home 2 days ago.  Patient was also found to be intoxicated with alcohol on presentation. Chest x-ray done in the emergency department showed right-sided rib fracture involving the ninth and 10th rib. Patient is currently being managed for alcohol withdrawal and has improved. She has also been evaluated  by physical therapy and recommended skilled nursing facility on discharge. She was deemed stable to D/C to SNF today and will continue Rehabilitation at [facility].  Discharge Diagnoses:  Principal Problem:    Alcohol withdrawal (HCC) Active Problems:   Tobacco abuse   Alcohol use disorder, severe, dependence (HCC)   Alcoholic cirrhosis of liver with ascites (HCC)   Right rib fracture   COPD (chronic obstructive pulmonary disease) (HCC)  Alcohol withdrawal:  -Continued to monitor CIWA score and was 5 this AM but it appears she is out of withdrawals currently.  -Followed the protocol.Less agitated last few days. -C/w Folic Acid, Thiamine, and MVI.   Right-sided chest pain: Secondary to fall sustaining the fracture.  -Patient has fracture of ninth and 10th right-sided ribs.   -Continue pain control.  Incentive spirometry. -Fall was most likely associated with alcohol intoxication. -Chest x-ray done  did not show any pneumonia. -C/w Pain control with Acetaminophen, Lidocaine Patch, Muscle Rub Cream, and Oxycodone  Chronic Alcohol Abuse: -Patient intoxicated on presentation.  Alcohol level found to be in the range of 264 on presentation.   -Counseled for alcohol cessation. -AST went from 63 -> 42 Started on CIWA protocol.  Continue thiamine and folic acid as above -Patient also benefits from follow-up with alcohol rehabilitation is an outpatient.  -Will request for social work evaluation. -Stopped Vyvanse given interaction with EtOH -Patient less agitated today and stable to D/C to SNF today.  COPD:  -Bilateral expiratory wheezes improving   -We will continue scheduled steroids and changed to po 40 mg x5 more days.   -Continue supplemental oxygen as needed. -C/w Home Nebs and Inhalers. -Patient continues to smoke.  Has 20 pack year smoking history; Given Nicotine Patch at D/C  -Repeat CXR as an outpatient   History of Alcoholic Liver Disease/Cirrhosis/Ascites:  -Continue diuretics.  She is on Lasix and spironolactone.   -Does  not need paracentesis at present. -Liver enzymes are not significantly elevated and AST went from 63 -> 42.  Hypokalemia -K+ was 3.3 this AM -Replete  prior to D/C -Continue to Monitor and Replete as Necessary -Repeat CMP at SNF  Bipolar Disease -C/w Fluoxetine, Risperidone -Given Haldol Last night but improved   ADHD -Stopped Vyvanse -C/w Home Modafinale  Hx of Migraines -C/w Triptan and Topiromate  Thrombocytopenia -In the setting of EtOH -No S/Sx of Bleeding -Platelet Count is now 115 -Continue to Monitor and Repeat CBC as an outpatient  Normocytic Anemia -Hb/Hct stable at 12.2/39.1 -Continue to Monitor for S/Sx of Bleeding -Repeat CBC at SNF  GERD -Restarted Home Protonix 40 mg daily  Discharge Instructions  Discharge Instructions    Call MD for:  difficulty breathing, headache or visual disturbances   Complete by:  As directed    Call MD for:  extreme fatigue   Complete by:  As directed    Call MD for:  hives   Complete by:  As directed    Call MD for:  persistant dizziness or light-headedness   Complete by:  As directed    Call MD for:  persistant nausea and vomiting   Complete by:  As directed    Call MD for:  redness, tenderness, or signs of infection (pain, swelling, redness, odor or green/yellow discharge around incision site)   Complete by:  As directed    Call MD for:  severe uncontrolled pain   Complete by:  As directed    Call MD for:  temperature >100.4   Complete by:  As directed    Diet - low sodium heart healthy   Complete by:  As directed    Discharge instructions   Complete by:  As directed    Follow up with PCP and GI as an outpatient. Avoid Alcohol. Take all medications as prescribed. If symptoms change or worsen please return to the ED for evaluation.   Increase activity slowly   Complete by:  As directed      Allergies as of 04/21/2017   No Known Allergies     Medication List    STOP taking these medications   lisdexamfetamine 70 MG capsule Commonly known as:  VYVANSE   meloxicam 15 MG tablet Commonly known as:  MOBIC     TAKE these medications   acetaminophen 500 MG  tablet Commonly known as:  TYLENOL Take 1 tablet (500 mg total) by mouth every 6 (six) hours as needed for mild pain or headache.   albuterol 108 (90 Base) MCG/ACT inhaler Commonly known as:  PROVENTIL HFA;VENTOLIN HFA Inhale 2 puffs into the lungs every 6 (six) hours as needed for wheezing or shortness of breath.   amLODipine 10 MG tablet Commonly known as:  NORVASC Take 10 mg by mouth daily.   EPIPEN 2-PAK 0.3 mg/0.3 mL Soaj injection Generic drug:  EPINEPHrine Inject 0.3 mg into the muscle once.   FLUoxetine 20 MG capsule Commonly known as:  PROZAC Take 20 mg by mouth daily.   fluticasone 50 MCG/ACT nasal spray Commonly known as:  FLONASE Place 1 spray into both nostrils as needed for allergies or rhinitis.   Fluticasone-Salmeterol 100-50 MCG/DOSE Aepb Commonly known as:  ADVAIR Inhale 1-2 puffs 2 (two) times daily into the lungs.   folic acid 1 MG tablet Commonly known as:  FOLVITE Take 1 tablet (1 mg total) by mouth daily.   furosemide 40 MG tablet Commonly known as:  LASIX Take 1  tablet (40 mg total) by mouth daily.   gabapentin 300 MG capsule Commonly known as:  NEURONTIN Take 300 mg by mouth 3 (three) times daily.   hydrocortisone cream 1 % Apply topically 3 (three) times daily.   ipratropium-albuterol 0.5-2.5 (3) MG/3ML Soln Commonly known as:  DUONEB Take 3 mLs by nebulization every 6 (six) hours as needed.   lidocaine 5 % Commonly known as:  LIDODERM Place 1 patch onto the skin daily. Remove & Discard patch within 12 hours or as directed by MD   lisinopril 10 MG tablet Commonly known as:  PRINIVIL,ZESTRIL Take 10 mg by mouth at bedtime.   modafinil 200 MG tablet Commonly known as:  PROVIGIL Take 200 mg by mouth 2 (two) times daily.   MUSCLE RUB 10-15 % Crea Apply 1 application topically as needed for muscle pain.   nicotine 21 mg/24hr patch Commonly known as:  NICODERM CQ - dosed in mg/24 hours Place 1 patch (21 mg total) onto the skin  daily.   ondansetron 4 MG disintegrating tablet Commonly known as:  ZOFRAN ODT Take 1 tablet (4 mg total) by mouth every 8 (eight) hours as needed for nausea or vomiting.   oxyCODONE 5 MG immediate release tablet Commonly known as:  Oxy IR/ROXICODONE Take 1 tablet (5 mg total) by mouth every 6 (six) hours as needed for severe pain.   pantoprazole 40 MG tablet Commonly known as:  PROTONIX Take 1 tablet (40 mg total) by mouth daily.   potassium chloride SA 20 MEQ tablet Commonly known as:  K-DUR,KLOR-CON Take 20 mEq by mouth daily.   predniSONE 20 MG tablet Commonly known as:  DELTASONE Take 2 tablets (40 mg total) by mouth daily with breakfast.   risperiDONE 0.5 MG tablet Commonly known as:  RISPERDAL Take 1 tablet (0.5 mg total) by mouth 3 (three) times daily as needed (agitation). What changed:  when to take this   rizatriptan 10 MG tablet Commonly known as:  MAXALT Take 1 tablet (10 mg total) by mouth as needed for migraine. May repeat in 2 hours if needed   spironolactone 100 MG tablet Commonly known as:  ALDACTONE Take 1 tablet (100 mg total) by mouth daily.   thiamine 100 MG tablet Take 1 tablet (100 mg total) by mouth daily.   topiramate 100 MG tablet Commonly known as:  TOPAMAX Take 1 tablet (100 mg total) by mouth daily.      Contact information for after-discharge care    Destination    [facility] .   Service:  Skilled Nursing Contact information: [address] [place] [address] [phone]             No Known Allergies  Consultations:  None  Procedures/Studies: Dg Chest 1 View  Result Date: 04/18/2017 CLINICAL DATA:  Follow-up rib fractures. History of chronic alcohol abuse, alcoholic liver disease, possible alcohol withdrawal. EXAM: CHEST 1 VIEW COMPARISON:  Plain film of the chest and right ribs dated 04/15/2017. FINDINGS: Mild cardiomegaly is stable. Overall cardiomediastinal silhouette is stable.  Lungs are clear. The displaced fractures of the posterior right eighth and ninth ribs appear grossly stable, better seen on earlier rib films. No new osseous abnormality. IMPRESSION: 1. Lungs are clear. No pneumonia, pleural effusion or pneumothorax seen. 2. Displaced fractures of the posterior right ribs appear grossly stable, better seen on earlier rib films. Electronically Signed   By: Bary Richard M.D.   On: 04/18/2017 11:33   Dg Ribs Unilateral  W/chest Right  Result Date: 04/15/2017 CLINICAL DATA:  Fall with right chest wall pain EXAM: RIGHT RIBS AND CHEST - 3+ VIEW COMPARISON:  03/14/2017 FINDINGS: Single-view chest demonstrates cardiomegaly with vascular congestion. No consolidation or pleural effusion. No pneumothorax. Right rib series demonstrates probable acute right ninth and tenth rib fractures. IMPRESSION: 1. Negative for pneumothorax or pleural effusion 2. Probable acute right ninth and tenth rib fractures 3. Cardiomegaly with vascular congestion. Electronically Signed   By: Jasmine Pang M.D.   On: 04/15/2017 03:22   US Abdomen Limited Ruq  Result Date: 03/25/2017 CLINICAL DATA:  Right upper quadrant pain EXAM: ULTRASOUND ABDOMEN LIMITED RIGHT UPPER QUADRANT COMPARISON:  CT abdomen and pelvis December 21, 2016 FINDINGS: Gallbladder: No gallstones or wall thickening visualized. There is no pericholecystic fluid. No sonographic Murphy sign noted by sonographer. Common bile duct: Diameter: 2 mm. No intrahepatic or extrahepatic biliary duct dilatation. Liver: No focal lesion identified. Liver echogenicity overall is increased. Portal vein is patent on color Doppler imaging with normal direction of blood flow towards the liver. IMPRESSION: Increase in liver echogenicity, a finding indicative of hepatic steatosis with potential underlying parenchymal liver disease. While no focal liver lesions are evident on this study, it must be cautioned that the sensitivity of ultrasound for detection of focal  liver lesions is diminished in this circumstance. Study otherwise unremarkable. Electronically Signed   By: Bretta Bang III M.D.   On: 03/25/2017 15:01    Subjective: Seen and examined at bedside and was nauseous this AM. Wanting to go to rehab and stating she needs to avoid Alcohol. No CP or SOB. Breathing stable and still has pain from fall.   Discharge Exam: Vitals:   04/21/17 0551 04/21/17 0922  BP: (!) 92/45   Pulse: 82   Resp: 18   Temp: 98.2 F (36.8 C)   SpO2: 99% 95%   Vitals:   04/20/17 2203 04/21/17 0000 04/21/17 0551 04/21/17 0922  BP: (!) 104/53 110/61 (!) 92/45   Pulse: 85 82 82   Resp: 18  18   Temp: 98.1 F (36.7 C)  98.2 F (36.8 C)   TempSrc: Oral  Oral   SpO2: 97%  99% 95%  Weight:      Height:       General: Pt is alert, awake, not in acute distress Cardiovascular: RRR, S1/S2 +, no rubs, no gallops Respiratory: Diminished bilaterally with some expiratory wheezing, no rhonchi; Unlabored breathing  Abdominal: Soft, NT, ND, bowel sounds + Extremities: Trace edema, no cyanosis  The results of significant diagnostics from this hospitalization (including imaging, microbiology, ancillary and laboratory) are listed below for reference.    Microbiology: No results found for this or any previous visit (from the past 240 hour(s)).   Labs: BNP (last 3 results) Recent Labs    12/09/16 0041  BNP 63.7   Basic Metabolic Panel: Recent Labs  Lab 04/15/17 0607 04/16/17 0454 04/18/17 0457 04/18/17 2153 04/19/17 0525 04/20/17 0548 04/21/17 0838  NA 141 136 134*  --  135 136 135  K 3.0* 3.4* 2.9* 4.3 3.8 3.4* 3.3*  CL 96* 99* 100*  --  105 107 103  CO2 35* 28 25  --  20* 20* 22  GLUCOSE 120* 121* 103*  --  99 92 99  BUN 9 8 15   --  10 13 18   CREATININE 0.52 0.42* 0.53  --  0.40* 0.45 0.49  CALCIUM 8.3* 8.9 9.3  --  9.1 8.9 9.5  MG 1.8  1.7 1.7  --   --  1.7 1.9  PHOS 4.4 2.9  --   --   --  3.7 4.6   Liver Function Tests: Recent Labs  Lab  04/16/17 0454 04/21/17 0838  AST 63* 42*  ALT 32 40  ALKPHOS 131* 122  BILITOT 2.0* 1.2  PROT 6.6 7.3  ALBUMIN 3.5 3.7   No results for input(s): LIPASE, AMYLASE in the last 168 hours. No results for input(s): AMMONIA in the last 168 hours. CBC: Recent Labs  Lab 04/15/17 0607 04/20/17 0548 04/21/17 0838  WBC 4.4 6.3 6.6  NEUTROABS  --  4.3 4.6  HGB 10.0* 10.8* 12.2  HCT 32.6* 35.3* 39.1  MCV 89.6 87.8 87.1  PLT 154 112* 115*   Cardiac Enzymes: No results for input(s): CKTOTAL, CKMB, CKMBINDEX, TROPONINI in the last 168 hours. BNP: Invalid input(s): POCBNP CBG: No results for input(s): GLUCAP in the last 168 hours. D-Dimer No results for input(s): DDIMER in the last 72 hours. Hgb A1c No results for input(s): HGBA1C in the last 72 hours. Lipid Profile No results for input(s): CHOL, HDL, LDLCALC, TRIG, CHOLHDL, LDLDIRECT in the last 72 hours. Thyroid function studies No results for input(s): TSH, T4TOTAL, T3FREE, THYROIDAB in the last 72 hours.  Invalid input(s): FREET3 Anemia work up No results for input(s): VITAMINB12, FOLATE, FERRITIN, TIBC, IRON, RETICCTPCT in the last 72 hours. Urinalysis    Component Value Date/Time   COLORURINE YELLOW 03/14/2017 1842   APPEARANCEUR CLEAR 03/14/2017 1842   LABSPEC 1.006 03/14/2017 1842   PHURINE 8.0 03/14/2017 1842   GLUCOSEU NEGATIVE 03/14/2017 1842   HGBUR MODERATE (A) 03/14/2017 1842   BILIRUBINUR NEGATIVE 03/14/2017 1842   BILIRUBINUR moderate (A) 06/19/2015 1906   KETONESUR NEGATIVE 03/14/2017 1842   PROTEINUR NEGATIVE 03/14/2017 1842   UROBILINOGEN >=8.0 06/19/2015 1906   UROBILINOGEN 0.2 09/14/2011 2141   NITRITE NEGATIVE 03/14/2017 1842   LEUKOCYTESUR TRACE (A) 03/14/2017 1842   Sepsis Labs Invalid input(s): PROCALCITONIN,  WBC,  LACTICIDVEN Microbiology No results found for this or any previous visit (from the past 240 hour(s)).  Time coordinating discharge: 35 minutes  SIGNED:  Merlene Laughter,  DO [facility] 04/21/2017, 11:06 AM Pager [phone]  If 7PM-7AM, please contact night-coverage [email or web address] Password [email or web address]

## 2017-04-20 NOTE — Progress Notes (Signed)
CSW following to assist patient with discharge planning to SNF for ST rehab.  CSW contacted one of patient's SNF options, [facility] to verify patient's Medicare as a primary insurance. Staff reported that patient's Medicare was active and that patient is in copay days.   Patient currently Ox4, CSW informed patient about PT recommendation for SNF for ST rehab, patient agreeable. CSW explained to patient that when patient was disoriented CSW worked with patient's daughters regarding discharge planning. CSW provided patient with SNF offers, patient selected [facility].  CSW informed [facility], SNF confirmed bed offer.  CSW contacted by [facility] and notified that patient's Medicare is secondary and that patient's [employer] is primary, SNF reported that they could not accept patient.  CSW provided update to patient, patient accompanied by her mother. Patient's mother reported that patient is unsafe to return home and requested that CSW contact [facility] to see if they could offer patient a bed. CSW explained that CSW would check but that patient has been discharged and that patient would need a discharge plan. CSW inquired about patient's ability to private pay for SNF, patient reported that she was not able to private pay.   CSW contacted [facility], staff reported that patient has Arts development officer as her primary and that they could offer a bed. Staff reported that patient would need insurance authorization.   CSW updated patient's RNCM that patient does not have insurance authorization for SNF and reports an inability to private pay for SNF. Patient's RNCM reported that patient was not safe to return home and that patient's attending MD would need to be contacted to staff patient's case.  CSW contacted patient's attending MD and provided update, patient's attending MD reported that patient was not safe to discharge home.  CSW updated [facility], staff  agreed to start insurance authorization.  CSW updated patient.  CSW will continue to follow and assist with discharge planning.  Celso Sickle, [place] Clinical Social Worker [facility] Cell#: [phone]

## 2017-04-20 NOTE — Progress Notes (Signed)
PT Cancellation Note  Patient Details Name: Anne Black MRN: [id number] DOB: September 05, 1960   Cancelled Treatment:    Reason Eval/Treat Not Completed: Other (comment).  Pt declined PT stating she was nauseous and wanted a shot for nausea.  Nurse is aware.  Will check back as schedule permits.   Enzo Montgomery 04/20/2017, 9:06 AM

## 2017-04-21 DIAGNOSIS — Z09 Encounter for follow-up examination after completed treatment for conditions other than malignant neoplasm: Secondary | ICD-10-CM

## 2017-04-21 LAB — COMPREHENSIVE METABOLIC PANEL
ALK PHOS: 122 U/L (ref 38–126)
ALT: 40 U/L (ref 14–54)
ANION GAP: 10 (ref 5–15)
AST: 42 U/L — ABNORMAL HIGH (ref 15–41)
Albumin: 3.7 g/dL (ref 3.5–5.0)
BUN: 18 mg/dL (ref 6–20)
CALCIUM: 9.5 mg/dL (ref 8.9–10.3)
CO2: 22 mmol/L (ref 22–32)
Chloride: 103 mmol/L (ref 101–111)
Creatinine, Ser: 0.49 mg/dL (ref 0.44–1.00)
GFR calc non Af Amer: >60 mL/min (ref >60)
Glucose, Bld: 99 mg/dL (ref 65–99)
Potassium: 3.3 mmol/L — ABNORMAL LOW (ref 3.5–5.1)
Sodium: 135 mmol/L (ref 135–145)
TOTAL PROTEIN: 7.3 g/dL (ref 6.5–8.1)
Total Bilirubin: 1.2 mg/dL (ref 0.3–1.2)

## 2017-04-21 LAB — CBC WITH DIFFERENTIAL/PLATELET
Basophils Absolute: 0 10*3/uL (ref 0.0–0.1)
Basophils Relative: 1 %
Eosinophils Absolute: 0.1 10*3/uL (ref 0.0–0.7)
Eosinophils Relative: 1 %
HCT: 39.1 % (ref 36.0–46.0)
HEMOGLOBIN: 12.2 g/dL (ref 12.0–15.0)
LYMPHS ABS: 1.2 10*3/uL (ref 0.7–4.0)
LYMPHS PCT: 18 %
MCH: 27.2 pg (ref 26.0–34.0)
MCHC: 31.2 g/dL (ref 30.0–36.0)
MCV: 87.1 fL (ref 78.0–100.0)
MONOS PCT: 11 %
Monocytes Absolute: 0.7 10*3/uL (ref 0.1–1.0)
NEUTROS ABS: 4.6 10*3/uL (ref 1.7–7.7)
NEUTROS PCT: 69 %
Platelets: 115 10*3/uL — ABNORMAL LOW (ref 150–400)
RBC: 4.49 MIL/uL (ref 3.87–5.11)
RDW: 18.1 % — ABNORMAL HIGH (ref 11.5–15.5)
WBC: 6.6 10*3/uL (ref 4.0–10.5)

## 2017-04-21 LAB — MAGNESIUM: Magnesium: 1.9 mg/dL (ref 1.7–2.4)

## 2017-04-21 LAB — PHOSPHORUS: Phosphorus: 4.6 mg/dL (ref 2.5–4.6)

## 2017-04-21 MED ORDER — HALOPERIDOL LACTATE 5 MG/ML IJ SOLN
2.5000 mg | Freq: Four times a day (QID) | INTRAMUSCULAR | Status: DC | PRN
  Administered 2017-04-21: 2.5 mg via INTRAVENOUS
  Filled 2017-04-21: qty 1

## 2017-04-21 MED ORDER — PANTOPRAZOLE SODIUM 40 MG PO TBEC
40.0000 mg | DELAYED_RELEASE_TABLET | Freq: Every day | ORAL | Status: DC
  Administered 2017-04-21: 40 mg via ORAL
  Filled 2017-04-21: qty 1

## 2017-04-21 MED ORDER — POTASSIUM CHLORIDE CRYS ER 20 MEQ PO TBCR: 40.0000 meq | EXTENDED_RELEASE_TABLET | Freq: Two times a day (BID) | ORAL | Status: DC

## 2017-04-21 NOTE — Progress Notes (Signed)
PT Cancellation Note  Patient Details Name: Anne Black MRN: [id number] DOB: 08-26-1960   Cancelled Treatment:    Reason Eval/Treat Not Completed: Fatigue/lethargy limiting ability to participate, events overnight noted. Will check back another time.   Rada Hay 04/21/2017, 8:04 AM Blanchard Kelch PT [phone]

## 2017-04-21 NOTE — Progress Notes (Signed)
Patient was alert, oriented x3 earlier this shift, she was visited by her daughter and was doing ok, she became restless, was medicated with Ativan 1 mg po at 2344, at 0030, patient became very agitated, confused and anxious, getting out of the bed, public safety were notified, 3 of them came in the room, patient wanted to take her home medication, one of the police searched patient's purse, a bottle of Zofran and Inhaler were found in the purse, MD was paged and ordered Haldol 2.5 mg IV q 6hrs PRN, patient was medicated with haldol at 0103, patient is resting in the bed with eyes closed at present time, no distress noted, home medication are kept in the patient bin in the Pyxis. Will continue to monitor.

## 2017-04-21 NOTE — Progress Notes (Signed)
Patient received [employer] authorization for SNF rehab.  CSW provided authorization details to [facility]. Staff confirmed patient's bed offer and patient's ability to dc to [facility].  CSW provided update to patient and spoke with patient about etoh use. Patient reported that she is aware that she needs to stop drinking and that she plans on going to AA meetings again. Patient reported that she felt AA meetings were helpful in the past. CSW encouraged patient to follow up with substance use treatment after completing ST rehab at [facility]. Patient granted CSW permission to update her mother.  CSW provided update to patient's mother.   Celso Sickle, [place] Clinical Social Worker [facility] Cell#: [phone]

## 2017-04-21 NOTE — Clinical Social Work Placement (Signed)
Patient received and accepted bed offer at [facility]. Facility aware of patient's discharge and confirmed patient's bed offer. [facility] contacted, patient's family notified. Patient's RN can call report to [phone], packet complete. CSW signing off, no other needs identified at this time.  CLINICAL SOCIAL WORK PLACEMENT  NOTE  Date:  04/21/2017  Patient Details  Name: Anne Black MRN: [id number] Date of Birth: Jan 10, 1961  Clinical Social Work is seeking post-discharge placement for this patient at the Skilled  Nursing Facility level of care (*CSW will initial, date and re-position this form in  chart as items are completed):  Yes   Patient/family provided with [place] Clinical Social Work Department's list of facilities offering this level of care within the geographic area requested by the patient (or if unable, by the patient's family).  Yes   Patient/family informed of their freedom to choose among providers that offer the needed level of care, that participate in Medicare, Medicaid or managed care program needed by the patient, have an available bed and are willing to accept the patient.  Yes   Patient/family informed of [facility]'s ownership interest in [facility] and [facility], as well as of the fact that they are under no obligation to receive care at these facilities.  PASRR submitted to EDS on 04/19/17     PASRR number received on 04/20/17     Existing PASRR number confirmed on       FL2 transmitted to all facilities in geographic area requested by pt/family on 04/19/17     FL2 transmitted to all facilities within larger geographic area on       Patient informed that his/her managed care company has contracts with or will negotiate with certain facilities, including the following:        Yes   Patient/family informed of bed offers received.  Patient chooses bed at [facility]     Physician recommends and patient chooses bed at      Patient to  be transferred to [facility] on 04/21/17.  Patient to be transferred to facility by [facility]     Patient family notified on 04/21/17 of transfer.  Name of family member notified:  [facility]     PHYSICIAN       Additional Comment:    _______________________________________________ Antionette Poles, LCSW 04/21/2017, 11:25 AM

## 2017-05-06 ENCOUNTER — Emergency Department (HOSPITAL_COMMUNITY): Payer: No Typology Code available for payment source

## 2017-05-06 ENCOUNTER — Other Ambulatory Visit: Payer: Self-pay

## 2017-05-06 ENCOUNTER — Emergency Department (HOSPITAL_COMMUNITY)
Admission: EM | Admit: 2017-05-06 | Discharge: 2017-05-06 | Disposition: A | Discharge status: Home / Self Care | Payer: No Typology Code available for payment source | Attending: Emergency Medicine | Admitting: Emergency Medicine

## 2017-05-06 ENCOUNTER — Encounter (HOSPITAL_COMMUNITY): Payer: Self-pay

## 2017-05-06 DIAGNOSIS — R110 Nausea: Secondary | ICD-10-CM | POA: Diagnosis present

## 2017-05-06 DIAGNOSIS — Z79899 Other long term (current) drug therapy: Secondary | ICD-10-CM | POA: Insufficient documentation

## 2017-05-06 DIAGNOSIS — F319 Bipolar disorder, unspecified: Secondary | ICD-10-CM | POA: Diagnosis not present

## 2017-05-06 DIAGNOSIS — F10229 Alcohol dependence with intoxication, unspecified: Secondary | ICD-10-CM | POA: Diagnosis not present

## 2017-05-06 DIAGNOSIS — J449 Chronic obstructive pulmonary disease, unspecified: Secondary | ICD-10-CM | POA: Diagnosis not present

## 2017-05-06 DIAGNOSIS — E860 Dehydration: Secondary | ICD-10-CM | POA: Insufficient documentation

## 2017-05-06 DIAGNOSIS — F909 Attention-deficit hyperactivity disorder, unspecified type: Secondary | ICD-10-CM | POA: Diagnosis not present

## 2017-05-06 DIAGNOSIS — F17210 Nicotine dependence, cigarettes, uncomplicated: Secondary | ICD-10-CM | POA: Diagnosis not present

## 2017-05-06 DIAGNOSIS — F419 Anxiety disorder, unspecified: Secondary | ICD-10-CM | POA: Insufficient documentation

## 2017-05-06 DIAGNOSIS — R109 Unspecified abdominal pain: Secondary | ICD-10-CM | POA: Insufficient documentation

## 2017-05-06 DIAGNOSIS — R197 Diarrhea, unspecified: Secondary | ICD-10-CM | POA: Insufficient documentation

## 2017-05-06 DIAGNOSIS — R112 Nausea with vomiting, unspecified: Secondary | ICD-10-CM | POA: Insufficient documentation

## 2017-05-06 LAB — CBC
HCT: 36.9 % (ref 36.0–46.0)
HEMOGLOBIN: 11.4 g/dL — AB (ref 12.0–15.0)
MCH: 27.2 pg (ref 26.0–34.0)
MCHC: 30.9 g/dL (ref 30.0–36.0)
MCV: 88.1 fL (ref 78.0–100.0)
Platelets: 149 10*3/uL — ABNORMAL LOW (ref 150–400)
RBC: 4.19 MIL/uL (ref 3.87–5.11)
RDW: 18.3 % — AB (ref 11.5–15.5)
WBC: 3.4 10*3/uL — ABNORMAL LOW (ref 4.0–10.5)

## 2017-05-06 LAB — COMPREHENSIVE METABOLIC PANEL
ALBUMIN: 3.9 g/dL (ref 3.5–5.0)
ALK PHOS: 140 U/L — AB (ref 38–126)
ALT: 31 U/L (ref 14–54)
ANION GAP: 13 (ref 5–15)
AST: 44 U/L — ABNORMAL HIGH (ref 15–41)
BILIRUBIN TOTAL: 0.8 mg/dL (ref 0.3–1.2)
CALCIUM: 8.6 mg/dL — AB (ref 8.9–10.3)
CO2: 29 mmol/L (ref 22–32)
Chloride: 100 mmol/L — ABNORMAL LOW (ref 101–111)
Creatinine, Ser: 0.45 mg/dL (ref 0.44–1.00)
GFR calc Af Amer: >60 mL/min (ref >60)
GFR calc non Af Amer: >60 mL/min (ref >60)
GLUCOSE: 100 mg/dL — AB (ref 65–99)
Potassium: 3.5 mmol/L (ref 3.5–5.1)
Sodium: 142 mmol/L (ref 135–145)
TOTAL PROTEIN: 7.1 g/dL (ref 6.5–8.1)

## 2017-05-06 LAB — DIFFERENTIAL
BASOS ABS: 0 10*3/uL (ref 0.0–0.1)
BASOS PCT: 1 %
EOS ABS: 0.2 10*3/uL (ref 0.0–0.7)
Eosinophils Relative: 5 %
Lymphocytes Relative: 25 %
Lymphs Abs: 0.9 10*3/uL (ref 0.7–4.0)
Monocytes Absolute: 0.3 10*3/uL (ref 0.1–1.0)
Monocytes Relative: 8 %
Neutro Abs: 2.1 10*3/uL (ref 1.7–7.7)
Neutrophils Relative %: 61 %

## 2017-05-06 LAB — URINALYSIS, ROUTINE W REFLEX MICROSCOPIC
BILIRUBIN URINE: NEGATIVE
Glucose, UA: NEGATIVE mg/dL
Hgb urine dipstick: NEGATIVE
KETONES UR: NEGATIVE mg/dL
Leukocytes, UA: NEGATIVE
NITRITE: NEGATIVE
PROTEIN: NEGATIVE mg/dL
Specific Gravity, Urine: 1.011 (ref 1.005–1.030)
pH: 7 (ref 5.0–8.0)

## 2017-05-06 LAB — I-STAT TROPONIN, ED: Troponin i, poc: 0.02 ng/mL (ref 0.00–0.08)

## 2017-05-06 LAB — ETHANOL: Alcohol, Ethyl (B): 117 mg/dL — ABNORMAL HIGH (ref <10)

## 2017-05-06 LAB — I-STAT CG4 LACTIC ACID, ED
LACTIC ACID, VENOUS: 2.06 mmol/L — AB (ref 0.5–1.9)
Lactic Acid, Venous: 4 mmol/L (ref 0.5–1.9)

## 2017-05-06 LAB — LIPASE, BLOOD: Lipase: 22 U/L (ref 11–51)

## 2017-05-06 MED ORDER — LOPERAMIDE HCL 2 MG PO CAPS
4.0000 mg | ORAL_CAPSULE | Freq: Once | ORAL | Status: AC
  Administered 2017-05-06: 4 mg via ORAL
  Filled 2017-05-06: qty 2

## 2017-05-06 MED ORDER — LISDEXAMFETAMINE DIMESYLATE 70 MG PO CAPS: 70.0000 mg | ORAL_CAPSULE | Freq: Every day | ORAL | 0 refills | Status: DC

## 2017-05-06 MED ORDER — PROMETHAZINE HCL 25 MG/ML IJ SOLN
12.5000 mg | Freq: Once | INTRAMUSCULAR | Status: AC
Start: 2017-05-06 — End: 2017-05-06
  Administered 2017-05-06: 12.5 mg via INTRAVENOUS
  Filled 2017-05-06: qty 1

## 2017-05-06 MED ORDER — SODIUM CHLORIDE 0.9 % IV BOLUS
1000.0000 mL | Freq: Once | INTRAVENOUS | Status: AC
  Administered 2017-05-06: 1000 mL via INTRAVENOUS

## 2017-05-06 MED ORDER — IOPAMIDOL (ISOVUE-300) INJECTION 61%
INTRAVENOUS | Status: AC
  Filled 2017-05-06: qty 100

## 2017-05-06 MED ORDER — SODIUM CHLORIDE 0.9 % IV SOLN
1000.0000 mL | INTRAVENOUS | Status: DC
  Administered 2017-05-06: 1000 mL via INTRAVENOUS

## 2017-05-06 MED ORDER — SODIUM CHLORIDE 0.9 % IJ SOLN
INTRAMUSCULAR | Status: AC
  Filled 2017-05-06: qty 50

## 2017-05-06 MED ORDER — LORAZEPAM 2 MG/ML IJ SOLN
1.0000 mg | Freq: Once | INTRAMUSCULAR | Status: AC
  Administered 2017-05-06: 1 mg via INTRAVENOUS
  Filled 2017-05-06: qty 1

## 2017-05-06 MED ORDER — METOCLOPRAMIDE HCL 5 MG/ML IJ SOLN
10.0000 mg | Freq: Once | INTRAMUSCULAR | Status: AC
  Administered 2017-05-06: 10 mg via INTRAVENOUS
  Filled 2017-05-06: qty 2

## 2017-05-06 MED ORDER — IOPAMIDOL (ISOVUE-300) INJECTION 61%
100.0000 mL | Freq: Once | INTRAVENOUS | Status: AC | PRN
  Administered 2017-05-06: 100 mL via INTRAVENOUS

## 2017-05-06 MED ORDER — HALOPERIDOL LACTATE 5 MG/ML IJ SOLN
5.0000 mg | Freq: Once | INTRAMUSCULAR | Status: AC
  Administered 2017-05-06: 5 mg via INTRAVENOUS
  Filled 2017-05-06: qty 1

## 2017-05-06 MED ORDER — ONDANSETRON HCL 4 MG/2ML IJ SOLN
INTRAMUSCULAR | Status: AC
  Administered 2017-05-06: 4 mg
  Filled 2017-05-06: qty 2

## 2017-05-06 MED ORDER — ONDANSETRON 4 MG PO TBDP
4.0000 mg | ORAL_TABLET | Freq: Once | ORAL | Status: DC | PRN
  Filled 2017-05-06: qty 1

## 2017-05-06 MED ORDER — SODIUM CHLORIDE 0.9 % IV BOLUS (SEPSIS)
1000.0000 mL | Freq: Once | INTRAVENOUS | Status: AC
  Administered 2017-05-06: 1000 mL via INTRAVENOUS

## 2017-05-06 NOTE — ED Notes (Signed)
Patient transported to CT 

## 2017-05-06 NOTE — ED Notes (Signed)
Went into patients room and discovered that she had urinated on the ground. Later confirmed by asking her when she arrived back at the room; patient stated that she "had bladder issues" as the reason why she urinated on the ground. Patient then went on to repeatedly ask about seeing her provider to get documentation for her court case Monday.

## 2017-05-06 NOTE — ED Notes (Addendum)
Pt's linens changed. Pt requesting anti-diarrhea and nausea meds. MD notified

## 2017-05-06 NOTE — Discharge Instructions (Signed)
You can continue to take the Zofran at home for the nausea but you can also take over-the-counter Imodium as needed for your diarrhea.

## 2017-05-06 NOTE — ED Notes (Signed)
MD AND RN NOTIFIED OF PATIENT'S LACTIC ACID LEVEL OF 2.06

## 2017-05-06 NOTE — ED Notes (Signed)
Dr Plunkett notified of patient's lactic result. 

## 2017-05-06 NOTE — ED Notes (Signed)
Patient now asking to be taken to medical records, despite multiple explanations that we are unable to complete this request and she will be contact medical records herself.

## 2017-05-06 NOTE — ED Notes (Signed)
Patient refuses to leave on cardiac monitor.

## 2017-05-06 NOTE — ED Triage Notes (Signed)
Pt complains of vomiting and abd pain for two days Pt states  it's also hard to take a deep breath because she had a broken rib from weeks ago

## 2017-05-06 NOTE — ED Triage Notes (Signed)
Pt wants to talk to a social worker in the morning because she wants to go to a facility

## 2017-05-06 NOTE — ED Provider Notes (Signed)
[facility]-EMERGENCY DEPT Provider Note   CSN: [id number] Arrival date & time: 05/06/17  0403     History   Chief Complaint Chief Complaint  Patient presents with  . Vomiting    HPI Anne Black is a 57 y.o. female.  Patient is a 57 year old female with a history of COPD, cirrhosis, chronic alcohol abuse with prior withdrawal and recent admission for rib fracture and alcohol withdrawal 2 weeks ago presenting today with 3 days of severe nausea, generalized abdominal pain, diarrhea and general feelings of being unwell.  She states that she is only had 1 or 2 episodes of vomiting throughout the several days but the nausea is so intense she has never experienced anything like that before.  She has ongoing pain in her right rib cage from her fracture but nothing new.  She denies any shortness of breath or new cough.  She has had no recent surgeries of her abdomen but has the ongoing issue with healing to a ventral hernia.  She has had no black stools or blood in her diarrhea.  She has never had variceal bleeding with her cirrhosis.  She has not drank any alcohol for several days because of how nauseated she was.  She denies fever, back pain or new leg swelling.  The history is provided by the patient and medical records.    Past Medical History:  Diagnosis Date  . ADHD (attention deficit hyperactivity disorder)   . Anxiety   . Arthritis   . Ascites   . Bipolar disorder (HCC)   . Cirrhosis (HCC)   . COPD (chronic obstructive pulmonary disease) (HCC)   . Hypokalemia   . Migraine   . Multiple sclerosis (HCC)   . Osteoporosis     Patient Active Problem List   Diagnosis Date Noted  . COPD (chronic obstructive pulmonary disease) (HCC) 04/19/2017  . Right rib fracture 04/15/2017  . Abdominal wall cellulitis 12/30/2016  . S/P exploratory laparotomy 12/30/2016  . Distal radius fracture, left 09/09/2016  . Thrombocytopenia (HCC) 09/09/2016  . Chest x-ray  abnormality   . Umbilical hernia without obstruction and without gangrene 05/12/2016  . Itching 02/24/2016  . Ventral hernia without obstruction or gangrene 02/24/2016  . Palliative care encounter   . Ascites   . Tachypnea   . Typical atrial flutter (HCC)   . Hypomagnesemia   . Hypoxia   . Dehydration 01/22/2016  . Low back ache 12/30/2015  . Non-intractable vomiting with nausea   . Alcohol abuse with alcohol-induced mood disorder (HCC) 12/15/2015  . Acute alcoholism (HCC)   . Abdominal distension 11/04/2015  . Alcohol abuse   . Dyspnea   . Acute respiratory failure (HCC) 10/15/2015  . Ascites due to alcoholic cirrhosis (HCC) 10/15/2015  . Osteoporosis   . Bipolar I disorder (HCC)   . Multiple sclerosis (HCC)   . Alcoholic cirrhosis of liver with ascites (HCC) 08/22/2015  . Bipolar disorder, current episode mixed, moderate (HCC) 08/01/2015  . Alcohol use disorder, severe, dependence (HCC) 07/31/2015  . Macrocytic anemia- due to alcohol abuse with normal B12 & folate levels 05/31/2015  . Severe protein-calorie malnutrition (HCC) 05/31/2015  . Hypokalemia 05/26/2015  . Encephalopathy, hepatic (HCC) 05/26/2015  . Alcohol withdrawal (HCC) 05/18/2015  . Abdominal pain 05/18/2015  . Anxiety 05/18/2015  . UTI (lower urinary tract infection) 05/18/2015  . Stimulant abuse (HCC) 12/27/2014  . Nicotine abuse 12/27/2014  . Hyperprolactinemia (HCC) 11/15/2014  . Dyslipidemia   . Tobacco abuse 01/23/2014  .  Noncompliance with therapeutic plan 04/04/2013  . Unspecified hereditary and idiopathic peripheral neuropathy 05/01/2012  . GERD (gastroesophageal reflux disease) 05/01/2012  . Osteoarthrosis, unspecified whether generalized or localized, involving lower leg 05/01/2012  . Hyponatremia 07/01/2011    Past Surgical History:  Procedure Laterality Date  . ABDOMINAL WALL DEFECT REPAIR N/A 12/30/2016   Procedure: EXPLORATORY LAPAROTOMY WITH REPAIR ABDOMINAL WALL VENTRAL HERNIA;   Surgeon: [name], MD;  Location: [facility] OR;  Service: General;  Laterality: N/A;  . APPLICATION OF WOUND VAC N/A 12/30/2016   Procedure: APPLICATION OF WOUND VAC;  Surgeon: [name], MD;  Location: [facility] OR;  Service: General;  Laterality: N/A;  . CESAREAN SECTION  [phone]  . FRACTURE SURGERY    . HERNIA REPAIR    . MYRINGOTOMY WITH TUBE PLACEMENT Bilateral   . ORIF WRIST FRACTURE Left 09/09/2016   Procedure: OPEN REDUCTION INTERNAL FIXATION (ORIF) LEFT WRIST FRACTURE, LEFT CARPAL TUNNEL RELEASE;  Surgeon: Dominica Severin, MD;  Location: [facility] ORS;  Service: Orthopedics;  Laterality: Left;  . TONSILLECTOMY       OB History   None      Home Medications    Prior to Admission medications   Medication Sig Start Date End Date Taking? Authorizing Provider  acetaminophen (TYLENOL) 500 MG tablet Take 1 tablet (500 mg total) by mouth every 6 (six) hours as needed for mild pain or headache. 03/11/17   Arrien, York Ram, MD  albuterol (PROVENTIL HFA;VENTOLIN HFA) 108 (90 Base) MCG/ACT inhaler Inhale 2 puffs into the lungs every 6 (six) hours as needed for wheezing or shortness of breath.    [provider]  amLODipine (NORVASC) 10 MG tablet Take 10 mg by mouth daily.    [provider]  EPINEPHrine (EPIPEN 2-PAK) 0.3 mg/0.3 mL IJ SOAJ injection Inject 0.3 mg into the muscle once.    [provider]  FLUoxetine (PROZAC) 20 MG capsule Take 20 mg by mouth daily.  03/30/16   [provider]  fluticasone (FLONASE) 50 MCG/ACT nasal spray Place 1 spray into both nostrils as needed for allergies or rhinitis.    [provider]  Fluticasone-Salmeterol (ADVAIR) 100-50 MCG/DOSE AEPB Inhale 1-2 puffs 2 (two) times daily into the lungs.  11/05/14   [provider]  folic acid (FOLVITE) 1 MG tablet Take 1 tablet (1 mg total) by mouth daily. 04/21/17   Marguerita Merles Latif, DO  furosemide (LASIX) 40 MG tablet Take 1 tablet (40 mg total) by mouth  daily. 06/17/16   Midge Minium, MD  gabapentin (NEURONTIN) 300 MG capsule Take 300 mg by mouth 3 (three) times daily.    [provider]  hydrocortisone cream 1 % Apply topically 3 (three) times daily. 04/20/17   Sheikh, Omair Latif, DO  ipratropium-albuterol (DUONEB) 0.5-2.5 (3) MG/3ML SOLN Take 3 mLs by nebulization every 6 (six) hours as needed. 03/11/17 04/10/17  Arrien, York Ram, MD  lidocaine (LIDODERM) 5 % Place 1 patch onto the skin daily. Remove & Discard patch within 12 hours or as directed by MD 04/21/17   Marguerita Merles Latif, DO  lisinopril (PRINIVIL,ZESTRIL) 10 MG tablet Take 10 mg by mouth at bedtime.    [provider]  Menthol-Methyl Salicylate (MUSCLE RUB) 10-15 % CREA Apply 1 application topically as needed for muscle pain. 04/20/17   Sheikh, Omair Latif, DO  modafinil (PROVIGIL) 200 MG tablet Take 200 mg by mouth 2 (two) times daily.  08/04/16   [provider]  nicotine (NICODERM CQ - DOSED IN  MG/24 HOURS) 21 mg/24hr patch Place 1 patch (21 mg total) onto the skin daily. 04/21/17   Marguerita Merles Latif, DO  ondansetron (ZOFRAN ODT) 4 MG disintegrating tablet Take 1 tablet (4 mg total) by mouth every 8 (eight) hours as needed for nausea or vomiting. 03/25/17   Renne Crigler, PA-C  oxyCODONE (OXY IR/ROXICODONE) 5 MG immediate release tablet Take 1 tablet (5 mg total) by mouth every 6 (six) hours as needed for severe pain. 04/20/17   Sheikh, Omair Latif, DO  pantoprazole (PROTONIX) 40 MG tablet Take 1 tablet (40 mg total) by mouth daily. 12/30/15   Haney, Arlyss Repress A, MD  potassium chloride SA (K-DUR,KLOR-CON) 20 MEQ tablet Take 20 mEq by mouth daily.    [provider]  predniSONE (DELTASONE) 20 MG tablet Take 2 tablets (40 mg total) by mouth daily with breakfast. 04/21/17   Marguerita Merles Latif, DO  risperiDONE (RISPERDAL) 0.5 MG tablet Take 1 tablet (0.5 mg total) by mouth 3 (three) times daily as needed (agitation). Patient taking differently: Take 0.5 mg  by mouth 2 (two) times daily.  02/06/16   Renne Musca, MD  rizatriptan (MAXALT) 10 MG tablet Take 1 tablet (10 mg total) by mouth as needed for migraine. May repeat in 2 hours if needed 03/25/17   Renne Crigler, PA-C  spironolactone (ALDACTONE) 100 MG tablet Take 1 tablet (100 mg total) by mouth daily. 11/23/16   Derwood Kaplan, MD  thiamine 100 MG tablet Take 1 tablet (100 mg total) by mouth daily. 04/21/17   Marguerita Merles Latif, DO  topiramate (TOPAMAX) 100 MG tablet Take 1 tablet (100 mg total) by mouth daily. 01/07/16   [name], MD    Family History Family History  Problem Relation Age of Onset  . Arrhythmia Mother   . Heart disease Father   . Hypertension Father     Social History Social History   Tobacco Use  . Smoking status: Current Every Day Smoker    Packs/day: 1.00    Years: 20.00    Pack years: 20.00    Types: Cigarettes  . Smokeless tobacco: Never Used  . Tobacco comment: Currently Vaping Daily  Substance Use Topics  . Alcohol use: Yes    Alcohol/week: 7.2 oz    Types: 12 Glasses of wine per week  . Drug use: No     Allergies   Patient has no known allergies.   Review of Systems Review of Systems  All other systems reviewed and are negative.    Physical Exam Updated Vital Signs BP (!) 119/59 (BP Location: Right Arm)   Pulse 89   Temp 98 F (36.7 C) (Oral)   Resp (!) 23   SpO2 92%   Physical Exam  Constitutional: She is oriented to person, place, and time. She appears well-developed and well-nourished. She appears distressed.  Appears uncomfortable  HENT:  Head: Normocephalic and atraumatic.  Mouth/Throat: Oropharynx is clear and moist. Mucous membranes are dry.  Eyes: Pupils are equal, round, and reactive to light. Conjunctivae and EOM are normal.  Neck: Normal range of motion. Neck supple.  Cardiovascular: Normal rate, regular rhythm and intact distal pulses.  No murmur heard. Pulmonary/Chest: Effort normal and breath  sounds normal. No respiratory distress. She has no wheezes. She has no rales.  Abdominal: Soft. She exhibits no distension. Bowel sounds are decreased. There is tenderness. There is no rebound and no guarding.  Generalized tenderness.  Wound over the ventral hernia without tunneling or evidence of  infection.  Hepatomegaly  Musculoskeletal: Normal range of motion. She exhibits no edema or tenderness.  Neurological: She is alert and oriented to person, place, and time.  Tremulous on exam  Skin: Skin is warm and dry. No rash noted. No erythema.  Psychiatric: She has a normal mood and affect. Her behavior is normal.  Nursing note and vitals reviewed.    ED Treatments / Results  Labs (all labs ordered are listed, but only abnormal results are displayed) Labs Reviewed  COMPREHENSIVE METABOLIC PANEL - Abnormal; Notable for the following components:      Result Value   Chloride 100 (*)    Glucose, Bld 100 (*)    BUN <5 (*)    Calcium 8.6 (*)    AST 44 (*)    Alkaline Phosphatase 140 (*)    All other components within normal limits  CBC - Abnormal; Notable for the following components:   WBC 3.4 (*)    Hemoglobin 11.4 (*)    RDW 18.3 (*)    Platelets 149 (*)    All other components within normal limits  ETHANOL - Abnormal; Notable for the following components:   Alcohol, Ethyl (B) 117 (*)    All other components within normal limits  I-STAT CG4 LACTIC ACID, ED - Abnormal; Notable for the following components:   Lactic Acid, Venous 4.00 (*)    All other components within normal limits  I-STAT CG4 LACTIC ACID, ED - Abnormal; Notable for the following components:   Lactic Acid, Venous 2.06 (*)    All other components within normal limits  LIPASE, BLOOD  URINALYSIS, ROUTINE W REFLEX MICROSCOPIC  DIFFERENTIAL  CBC WITH DIFFERENTIAL/PLATELET  I-STAT TROPONIN, ED    EKG EKG Interpretation  Date/Time:  Friday May 06 2017 07:58:11 EDT Ventricular Rate:  91 PR Interval:    QRS  Duration: 98 QT Interval:  388 QTC Calculation: 478 R Axis:   87 Text Interpretation:  Sinus rhythm Artifact No significant change since last tracing Confirmed by Gwyneth Sprout ([id number]) on 05/06/2017 10:09:27 AM   Radiology Ct Abdomen Pelvis W Contrast  Result Date: 05/06/2017 CLINICAL DATA:  Vomiting and abdominal pain for 2 days. Recent rib fractures. Cirrhosis. EXAM: CT ABDOMEN AND PELVIS WITH CONTRAST TECHNIQUE: Multidetector CT imaging of the abdomen and pelvis was performed using the standard protocol following bolus administration of intravenous contrast. CONTRAST:  ISOVUE-300 IOPAMIDOL (ISOVUE-300) INJECTION 61% COMPARISON:  CT abdomen and pelvis 12/21/2016. FINDINGS: Lower chest: Lung bases are clear without focal nodule, mass, or airspace disease. Heart size is normal. No significant pleural or pericardial effusion is present. Bilateral breast implants are noted. Hepatobiliary: Changes of hepatic cirrhosis are again noted. No discrete lesion is present. The common bile duct and gallbladder are within normal limits. Pancreas: Unremarkable. No pancreatic ductal dilatation or surrounding inflammatory changes. Spleen: Spleen is enlarged, measuring 12.6 cm. No focal lesions are present. Adrenals/Urinary Tract: Adrenal glands are normal bilaterally. Kidneys and ureters are within normal limits. The urinary bladder is unremarkable. Stomach/Bowel: Stomach and duodenum are within normal limits. Small bowel is unremarkable. Vascular/Lymphatic: Multiple gastric varices are again noted. Atherosclerotic calcifications are present in the distal aorta without aneurysm or stenosis. Reproductive: Uterus and bilateral adnexa are unremarkable. Other: A small amount of free fluid is present about the liver and along the right side of the pelvis. No significant ventral hernia is present. Musculoskeletal: Healing right sixth, seventh, eighth, and ninth anterolateral rib fractures are noted. IMPRESSION: 1.  Similar findings of hepatic  cirrhosis. 2. Sequelae of splenomegaly and gastric varices. 3. Small volume ascites is similar the prior exam. 4. Healing right-sided rib fractures. 5. No acute abnormality. Electronically Signed   By: Marin Roberts M.D.   On: 05/06/2017 11:53   Dg Abd Acute W/chest  Result Date: 05/06/2017 CLINICAL DATA:  Diarrhea over the last 3 days. EXAM: DG ABDOMEN ACUTE W/ 1V CHEST COMPARISON:  04/18/2017 FINDINGS: Bowel gas pattern does not show evidence ileus, obstruction or free air. No abnormal calcifications or significant bone findings. One-view chest shows normal heart size and mediastinal shadows. The lungs are clear. Healed or healing proximal clavicle fracture on the right. IMPRESSION: Negative abdominal radiographs.  No acute cardiopulmonary disease. Electronically Signed   By: Paulina Fusi M.D.   On: 05/06/2017 11:26    Procedures Procedures (including critical care time)  Medications Ordered in ED Medications  sodium chloride 0.9 % bolus 1,000 mL (0 mLs Intravenous Stopped 05/06/17 0700)    Followed by  0.9 %  sodium chloride infusion (1,000 mLs Intravenous New Bag/Given 05/06/17 0745)  sodium chloride 0.9 % bolus 1,000 mL (has no administration in time range)  ondansetron (ZOFRAN) 4 MG/2ML injection (4 mg  Given 05/06/17 0549)  metoCLOPramide (REGLAN) injection 10 mg (10 mg Intravenous Given 05/06/17 0745)  LORazepam (ATIVAN) injection 1 mg (1 mg Intravenous Given 05/06/17 0745)     Initial Impression / Assessment and Plan / ED Course  I have reviewed the triage vital signs and the nursing notes.  Pertinent labs & imaging results that were available during my care of the patient were reviewed by me and considered in my medical decision making (see chart for details).     Patient is a 57 year old female presenting with severe nausea inability to eat or drink in the setting of being an alcohol abuser and a history of cirrhosis.  Patient states she had not  drink for several days however alcohol level here is 117.  Patient is tremulous on exam and concern for potential withdrawal.  There is no evidence of pancreatitis today with a normal lipase, CMP with stable potassium and liver enzymes.  Patient's troponin evaluating for silent MI was within normal limits and EKG without acute changes.  Patient's lactate is 4 today and she has been given IV fluids.  She has more diffuse abdominal tenderness and will do a chest x-ray and KUB to evaluate for signs of obstruction.  Also recent rib fracture and hospitalization so will ensure no evidence of pneumonia.  Patient's oxygen saturations are greater than 90% on room air and she has no wheezing on exam.  UA within normal limits.  Patient initially was given Zofran which she states did not help at all so she was given Reglan and Ativan for nausea and withdrawal.  10:11 AM She is still complaining of nausea and diarrhea.  EKG without acute findings and troponin is negative.  Patient given Phenergan for nausea and Imodium.  Initially she refused x-ray but encouraged to get them done to ensure no acute process.  Patient is still having diarrhea and nausea.  We will do a CT to rule out colitis or diverticulitis.  She is tolerating some liquids.  12:04 PM Imaging is negative for acute abnormality.  X-ray shows a healing fracture but no signs of pneumonia.  Patient is tolerating liquids without further vomiting but still complaining of nausea.  Will repeat lactate to ensure improvement.  However feel patient may be safe for discharge  2:08 PM Repeat  lactate of 2.06.  Patient has had no further vomiting.  Will discharge home and given return precautions.  Final Clinical Impressions(s) / ED Diagnoses   Final diagnoses:  Nausea vomiting and diarrhea  Dehydration    ED Discharge Orders        Ordered    lisdexamfetamine (VYVANSE) 70 MG capsule  Daily     05/06/17 1405       Gwyneth Sprout, MD 05/06/17  1408

## 2017-05-06 NOTE — ED Notes (Addendum)
Pt prefers we wait until after her phone calls to draw labwork.

## 2017-05-06 NOTE — Care Management Note (Signed)
Case Management Note  Patient Details  Name: Anne Black MRN: [id number] Date of Birth: [birth year]  CM consulted for possible [facility] services vs. returning to [facility] where she recently D/C vs. possible admission.  Pt is requesting to go back to the facility.  CM noted pt was D/C from admission to [facility] on 04/21/2017.  CM contacted [facility] who was very familiar with the pt even though she was only there for 4 days through 04/25/2017 at which time she signed herself out early.  They advised she was set up with [facility].  CM contacted Elnita Maxwell with [facility] who advised she is very familiar with pt.  The last attempt she made to see the pt, the pt wouldn't open the door for her.  Elnita Maxwell advised the pt has a significant substance abuse problem and she believes she get's high and drinks all night while sleeping all day.  She reports since the pt left the SNF she has called her at 1:30 - 2:00 am intoxicated.  The pt will also bargain with the [facility] agency team members for food or transportation to locations for the ability to let them make home visits.  Elnita Maxwell advised their team will continue to attempt to visit pt at the home and work with her.  She reports no need for new orders.  Updated Dr. Anitra Lauth.  No further CM needs noted at this time.  Expected Discharge Date:   05/06/2017               Expected Discharge Plan:  Home w Home Health Services  Discharge planning Services  CM Consult  Post Acute Care Choice:  Home Health Choice offered to:  Patient  HH Arranged:  RN, PT, OT [facility] Agency:  [facility] Services  Status of Service:  Completed, signed off  Rica Koyanagi, RN 05/06/2017, 12:49 PM

## 2017-05-06 NOTE — ED Notes (Signed)
Patient continues to remove monitoring devices. Patient is fixated on haldol. Patient continues to call lawyer, pacing, and fixated on legal problems.

## 2017-05-06 NOTE — ED Notes (Signed)
Refusing x-rays

## 2017-05-10 ENCOUNTER — Encounter: Payer: Self-pay | Admitting: Physician Assistant

## 2017-05-18 ENCOUNTER — Telehealth: Payer: Self-pay | Admitting: Family Medicine

## 2017-05-18 ENCOUNTER — Ambulatory Visit: Payer: Self-pay

## 2017-05-18 NOTE — Telephone Encounter (Signed)
Called pt and left message to go to [facility] or ED tonight for the pain and to be evaluated.

## 2017-05-18 NOTE — Telephone Encounter (Signed)
Copied from CRM [id number]. Topic: Quick Communication - See Telephone Encounter >> May 18, 2017  7:17 PM Trula Slade wrote: CRM for notification. See Telephone encounter for: 05/18/17. Patient stated she has broken ribs and she left the rehabilitation facility too soon.  She would like to be admitted back into rehabilitation because she is in terrible pain and she can't take care of herself.

## 2017-05-19 ENCOUNTER — Encounter (HOSPITAL_COMMUNITY): Payer: Self-pay | Admitting: Emergency Medicine

## 2017-05-19 ENCOUNTER — Ambulatory Visit: Payer: Self-pay

## 2017-05-19 ENCOUNTER — Other Ambulatory Visit: Payer: Self-pay

## 2017-05-19 ENCOUNTER — Emergency Department (HOSPITAL_COMMUNITY)
Admission: EM | Admit: 2017-05-19 | Discharge: 2017-05-20 | Discharge status: Against Medical Advice | Payer: No Typology Code available for payment source | Attending: Emergency Medicine | Admitting: Emergency Medicine

## 2017-05-19 DIAGNOSIS — Z5321 Procedure and treatment not carried out due to patient leaving prior to being seen by health care provider: Secondary | ICD-10-CM | POA: Insufficient documentation

## 2017-05-19 DIAGNOSIS — G8911 Acute pain due to trauma: Secondary | ICD-10-CM | POA: Diagnosis present

## 2017-05-19 NOTE — ED Triage Notes (Addendum)
Pt from home via EMS with c/o rt sided rib pain x 2 weeks from previous fall. Pt reports rib fx from fall. Pt rates pain 6/10 and states she recently ran out of her pain medication  "multiple days ago". Pt has clear lung sounds. Pt was given O2 tank for oxygen of 89% RA. Pt states this is her normal O2 and her pulmonologist is aware. Pt asked for a sandwich and when told she can not eat or drink until she is seen by a doctor, she said "If I can't eat, I'm just going to call my mama to come get me". Pt then agreed to stay to be evaluated.

## 2017-05-19 NOTE — ED Notes (Signed)
Pt's name called to be taken to treatment room no answer x1. Pt left oxygen tank in lobby. Will recall pt's name

## 2017-05-19 NOTE — ED Notes (Signed)
Called Pt in lobby to be roomed, no response in lobby x2, Do not see PT in lobby.

## 2017-05-19 NOTE — ED Notes (Signed)
Called Pt to be roomed, no response in lobby x3. Do not see Pt in lobby.

## 2017-05-19 NOTE — Telephone Encounter (Signed)
Needs office visit for assessment and recommendation.

## 2017-05-19 NOTE — Telephone Encounter (Signed)
Pt. Reports she is very weak and can not take care of herself. States her family refuses to help her. States "I left rehab too soon." "I have fallen a lot at home and I don't want to get hurt again. I can't cook for myself or anything." Having pain from "broken ribs." "I need to be in a facility until I can take care of myself." Instructed pt. To be evaluated at ED. Verbalizes understanding. Reason for Disposition . Patient sounds very sick or weak to the triager  Answer Assessment - Initial Assessment Questions 1. DESCRIPTION: "Describe how you are feeling."     Very weak -I can't take care of myself 2. SEVERITY: "How bad is it?"  "Can you stand and walk?"   - MILD - Feels weak or tired, but does not interfere with work, school or normal activities   - MODERATE - Able to stand and walk; weakness interferes with work, school, or normal activities   - SEVERE - Unable to stand or walk     Moderate 3. ONSET:  "When did the weakness begin?"     Last week 4. CAUSE: "What do you think is causing the weakness?"     I left rehab too soon 5. MEDICINES: "Have you recently started a new medicine or had a change in the amount of a medicine?"     No 6. OTHER SYMPTOMS: "Do you have any other symptoms?" (e.g., chest pain, fever, cough, SOB, vomiting, diarrhea, bleeding)     Just weak and I have history of falling 7. PREGNANCY: "Is there any chance you are pregnant?" "When was your last menstrual period?"     No  Protocols used: WEAKNESS (GENERALIZED) AND FATIGUE-A-AH

## 2017-05-21 ENCOUNTER — Telehealth: Payer: Self-pay | Admitting: Family Medicine

## 2017-05-21 NOTE — Telephone Encounter (Signed)
Pt. Called to request refills on Vyvanse, Zofran, Vistiril, Provigil.

## 2017-05-23 NOTE — Telephone Encounter (Signed)
Called pt to let her know that she would need to come in for an office visit in order to get the refills requested. Left VM for her to call the office and make an office visit with [facility].

## 2017-05-23 NOTE — Telephone Encounter (Signed)
Needs office visit for refills of these medications.

## 2017-05-27 ENCOUNTER — Ambulatory Visit: Payer: Self-pay | Admitting: Urgent Care

## 2017-05-27 ENCOUNTER — Emergency Department (HOSPITAL_COMMUNITY)
Admission: EM | Admit: 2017-05-27 | Discharge: 2017-05-27 | Discharge status: Against Medical Advice | Payer: No Typology Code available for payment source | Source: Home / Self Care | Attending: Emergency Medicine | Admitting: Emergency Medicine

## 2017-05-27 ENCOUNTER — Telehealth: Payer: Self-pay

## 2017-05-27 ENCOUNTER — Other Ambulatory Visit: Payer: Self-pay

## 2017-05-27 ENCOUNTER — Encounter (HOSPITAL_COMMUNITY): Payer: Self-pay | Admitting: Emergency Medicine

## 2017-05-27 DIAGNOSIS — I4892 Unspecified atrial flutter: Secondary | ICD-10-CM | POA: Diagnosis not present

## 2017-05-27 DIAGNOSIS — Z5321 Procedure and treatment not carried out due to patient leaving prior to being seen by health care provider: Secondary | ICD-10-CM | POA: Insufficient documentation

## 2017-05-27 DIAGNOSIS — R002 Palpitations: Secondary | ICD-10-CM | POA: Diagnosis not present

## 2017-05-27 NOTE — ED Notes (Signed)
Pt states she does not want to "wait around for 5 hours to be seen." She requested to leave. I advised that she has been waiting 30 minutes and a provider was going to see her.  Dillard Cannon, RN was a witness to patient wanting to leave and help encouraged her to stay. Pt states that she "deserves to be seen immediately and have effective care." She then proceeded to point at Dr. Lynelle Doctor, and stated he "was a lazy doctor and was not in a rush to see anyone." She persisted and agreed to sign an [facility] electronic form.

## 2017-05-27 NOTE — Telephone Encounter (Signed)
Multiple attempts have been made to schedule pt for office visit to evaluated. Please schedule.   Copied from CRM [id number]. Topic: General - Other >> May 27, 2017  4:25 PM Terisa Starr wrote: 'Reason for CRM: Patient called in today at 4:15 saying " I keep urinating on myself, I cant get my depends on ", I asked her what she would like me to do? She said " I don't know" I advised her that I scheduled her an appt for today at 12pm that she no showed. She said " I did not make any appt , I don't know what you are talking about" I further did not know what else to do for her so I called the office & let them help her.

## 2017-05-27 NOTE — ED Triage Notes (Signed)
Per EMS, pt was requesting help for alcoholism. Pt has been trying to get into a rehab facility for this. Pt also has a wound on her abdomen per EMS. Pt also c/o SHOB and trouble controlling bowel and bladder.   Albuterol 5 mg on scene by fire.

## 2017-05-27 NOTE — ED Notes (Signed)
Bed: [facility] Expected date:  Expected time:  Means of arrival:  Comments: EMS intoxicated female with abdominal wound-old/wheezing-albuterol per fire

## 2017-05-27 NOTE — ED Provider Notes (Signed)
Pt left before I was able to evaluate her.  She yelled at me that I was lazy as I walked to another pt's room.   Appears to be breathing easily.  Pt was able to walk out of the ED on her own without difficulty.   Linwood Dibbles, MD 05/27/17 2245

## 2017-05-30 ENCOUNTER — Inpatient Hospital Stay (HOSPITAL_COMMUNITY)
Admission: EM | Admit: 2017-05-30 | Discharge: 2017-05-30 | DRG: 309 | Discharge status: Against Medical Advice | Payer: No Typology Code available for payment source | Attending: Internal Medicine | Admitting: Internal Medicine

## 2017-05-30 ENCOUNTER — Other Ambulatory Visit: Payer: Self-pay

## 2017-05-30 ENCOUNTER — Encounter (HOSPITAL_COMMUNITY): Payer: Self-pay

## 2017-05-30 ENCOUNTER — Emergency Department (HOSPITAL_COMMUNITY): Payer: No Typology Code available for payment source

## 2017-05-30 DIAGNOSIS — I4891 Unspecified atrial fibrillation: Secondary | ICD-10-CM

## 2017-05-30 DIAGNOSIS — F1510 Other stimulant abuse, uncomplicated: Secondary | ICD-10-CM | POA: Diagnosis present

## 2017-05-30 DIAGNOSIS — Z7951 Long term (current) use of inhaled steroids: Secondary | ICD-10-CM

## 2017-05-30 DIAGNOSIS — Z8249 Family history of ischemic heart disease and other diseases of the circulatory system: Secondary | ICD-10-CM

## 2017-05-30 DIAGNOSIS — R002 Palpitations: Secondary | ICD-10-CM

## 2017-05-30 DIAGNOSIS — F10239 Alcohol dependence with withdrawal, unspecified: Secondary | ICD-10-CM | POA: Diagnosis present

## 2017-05-30 DIAGNOSIS — I4892 Unspecified atrial flutter: Secondary | ICD-10-CM | POA: Diagnosis not present

## 2017-05-30 DIAGNOSIS — R000 Tachycardia, unspecified: Secondary | ICD-10-CM | POA: Insufficient documentation

## 2017-05-30 DIAGNOSIS — G35 Multiple sclerosis: Secondary | ICD-10-CM | POA: Diagnosis present

## 2017-05-30 DIAGNOSIS — Z5321 Procedure and treatment not carried out due to patient leaving prior to being seen by health care provider: Secondary | ICD-10-CM | POA: Diagnosis not present

## 2017-05-30 DIAGNOSIS — E876 Hypokalemia: Secondary | ICD-10-CM | POA: Diagnosis present

## 2017-05-30 DIAGNOSIS — Y908 Blood alcohol level of 240 mg/100 ml or more: Secondary | ICD-10-CM | POA: Diagnosis present

## 2017-05-30 DIAGNOSIS — D6959 Other secondary thrombocytopenia: Secondary | ICD-10-CM | POA: Diagnosis present

## 2017-05-30 DIAGNOSIS — F17210 Nicotine dependence, cigarettes, uncomplicated: Secondary | ICD-10-CM | POA: Diagnosis present

## 2017-05-30 DIAGNOSIS — G47419 Narcolepsy without cataplexy: Secondary | ICD-10-CM | POA: Diagnosis present

## 2017-05-30 DIAGNOSIS — Z720 Tobacco use: Secondary | ICD-10-CM | POA: Diagnosis present

## 2017-05-30 DIAGNOSIS — Z7952 Long term (current) use of systemic steroids: Secondary | ICD-10-CM

## 2017-05-30 DIAGNOSIS — G43909 Migraine, unspecified, not intractable, without status migrainosus: Secondary | ICD-10-CM | POA: Diagnosis present

## 2017-05-30 DIAGNOSIS — F909 Attention-deficit hyperactivity disorder, unspecified type: Secondary | ICD-10-CM | POA: Diagnosis present

## 2017-05-30 DIAGNOSIS — R079 Chest pain, unspecified: Secondary | ICD-10-CM | POA: Diagnosis present

## 2017-05-30 DIAGNOSIS — K7030 Alcoholic cirrhosis of liver without ascites: Secondary | ICD-10-CM | POA: Diagnosis present

## 2017-05-30 DIAGNOSIS — R7989 Other specified abnormal findings of blood chemistry: Secondary | ICD-10-CM

## 2017-05-30 DIAGNOSIS — M810 Age-related osteoporosis without current pathological fracture: Secondary | ICD-10-CM | POA: Diagnosis present

## 2017-05-30 DIAGNOSIS — R945 Abnormal results of liver function studies: Secondary | ICD-10-CM

## 2017-05-30 DIAGNOSIS — T510X1A Toxic effect of ethanol, accidental (unintentional), initial encounter: Secondary | ICD-10-CM | POA: Diagnosis present

## 2017-05-30 DIAGNOSIS — J449 Chronic obstructive pulmonary disease, unspecified: Secondary | ICD-10-CM | POA: Diagnosis present

## 2017-05-30 DIAGNOSIS — F319 Bipolar disorder, unspecified: Secondary | ICD-10-CM | POA: Diagnosis present

## 2017-05-30 DIAGNOSIS — F10939 Alcohol use, unspecified with withdrawal, unspecified: Secondary | ICD-10-CM | POA: Diagnosis present

## 2017-05-30 DIAGNOSIS — D696 Thrombocytopenia, unspecified: Secondary | ICD-10-CM | POA: Diagnosis present

## 2017-05-30 HISTORY — DX: Narcolepsy without cataplexy: G47.419

## 2017-05-30 LAB — HEPATIC FUNCTION PANEL
ALBUMIN: 3.4 g/dL — AB (ref 3.5–5.0)
ALT: 57 U/L — ABNORMAL HIGH (ref 14–54)
AST: 86 U/L — ABNORMAL HIGH (ref 15–41)
Alkaline Phosphatase: 107 U/L (ref 38–126)
BILIRUBIN DIRECT: 0.5 mg/dL (ref 0.1–0.5)
Indirect Bilirubin: 0.9 mg/dL (ref 0.3–0.9)
Total Bilirubin: 1.4 mg/dL — ABNORMAL HIGH (ref 0.3–1.2)
Total Protein: 6 g/dL — ABNORMAL LOW (ref 6.5–8.1)

## 2017-05-30 LAB — DIFFERENTIAL
BASOS ABS: 0 10*3/uL (ref 0.0–0.1)
Basophils Relative: 0 %
EOS ABS: 0 10*3/uL (ref 0.0–0.7)
Eosinophils Relative: 0 %
LYMPHS ABS: 1.9 10*3/uL (ref 0.7–4.0)
LYMPHS PCT: 26 %
Monocytes Absolute: 0.4 10*3/uL (ref 0.1–1.0)
Monocytes Relative: 6 %
NEUTROS PCT: 68 %
Neutro Abs: 5.1 10*3/uL (ref 1.7–7.7)

## 2017-05-30 LAB — BASIC METABOLIC PANEL
ANION GAP: 14 (ref 5–15)
BUN: 6 mg/dL (ref 6–20)
CHLORIDE: 94 mmol/L — AB (ref 101–111)
CO2: 30 mmol/L (ref 22–32)
Calcium: 7.7 mg/dL — ABNORMAL LOW (ref 8.9–10.3)
Creatinine, Ser: 0.43 mg/dL — ABNORMAL LOW (ref 0.44–1.00)
GFR calc non Af Amer: >60 mL/min (ref >60)
Glucose, Bld: 90 mg/dL (ref 65–99)
POTASSIUM: 3.3 mmol/L — AB (ref 3.5–5.1)
SODIUM: 138 mmol/L (ref 135–145)

## 2017-05-30 LAB — CBC
HEMATOCRIT: 38.2 % (ref 36.0–46.0)
HEMOGLOBIN: 12.4 g/dL (ref 12.0–15.0)
MCH: 27.4 pg (ref 26.0–34.0)
MCHC: 32.5 g/dL (ref 30.0–36.0)
MCV: 84.3 fL (ref 78.0–100.0)
Platelets: 126 10*3/uL — ABNORMAL LOW (ref 150–400)
RBC: 4.53 MIL/uL (ref 3.87–5.11)
RDW: 18 % — ABNORMAL HIGH (ref 11.5–15.5)
WBC: 7.5 10*3/uL (ref 4.0–10.5)

## 2017-05-30 LAB — I-STAT TROPONIN, ED: Troponin i, poc: 0 ng/mL (ref 0.00–0.08)

## 2017-05-30 LAB — I-STAT BETA HCG BLOOD, ED (MC, WL, AP ONLY)

## 2017-05-30 LAB — ETHANOL: ALCOHOL ETHYL (B): 263 mg/dL — AB (ref <10)

## 2017-05-30 MED ORDER — LORAZEPAM 2 MG/ML IJ SOLN
0.5000 mg | Freq: Once | INTRAMUSCULAR | Status: AC
Start: 2017-05-30 — End: 2017-05-30
  Administered 2017-05-30: 0.5 mg via INTRAVENOUS
  Filled 2017-05-30: qty 1

## 2017-05-30 MED ORDER — ASPIRIN EC 81 MG PO TBEC: 81.0000 mg | DELAYED_RELEASE_TABLET | Freq: Every day | ORAL | Status: DC

## 2017-05-30 MED ORDER — NITROGLYCERIN 0.4 MG SL SUBL: 0.4000 mg | SUBLINGUAL_TABLET | SUBLINGUAL | Status: DC | PRN

## 2017-05-30 MED ORDER — ONDANSETRON HCL 4 MG/2ML IJ SOLN
4.0000 mg | Freq: Once | INTRAMUSCULAR | Status: AC
  Administered 2017-05-30: 4 mg via INTRAVENOUS
  Filled 2017-05-30: qty 2

## 2017-05-30 MED ORDER — METOPROLOL TARTRATE 5 MG/5ML IV SOLN
5.0000 mg | Freq: Once | INTRAVENOUS | Status: AC
  Administered 2017-05-30: 5 mg via INTRAVENOUS
  Filled 2017-05-30: qty 5

## 2017-05-30 MED ORDER — HEPARIN (PORCINE) IN NACL 100-0.45 UNIT/ML-% IJ SOLN
900.0000 [IU]/h | INTRAMUSCULAR | Status: DC
  Filled 2017-05-30: qty 250

## 2017-05-30 MED ORDER — IPRATROPIUM BROMIDE 0.02 % IN SOLN: 0.5000 mg | Freq: Four times a day (QID) | RESPIRATORY_TRACT | Status: DC | PRN

## 2017-05-30 MED ORDER — DILTIAZEM HCL-DEXTROSE 100-5 MG/100ML-% IV SOLN (PREMIX): 5.0000 mg/h | INTRAVENOUS | Status: DC

## 2017-05-30 MED ORDER — NICOTINE 21 MG/24HR TD PT24: 21.0000 mg | MEDICATED_PATCH | Freq: Every day | TRANSDERMAL | Status: DC

## 2017-05-30 MED ORDER — ADENOSINE 6 MG/2ML IV SOLN
6.0000 mg | Freq: Once | INTRAVENOUS | Status: AC
  Administered 2017-05-30: 6 mg via INTRAVENOUS
  Filled 2017-05-30: qty 2

## 2017-05-30 MED ORDER — LEVALBUTEROL HCL 0.63 MG/3ML IN NEBU: 0.6300 mg | INHALATION_SOLUTION | Freq: Four times a day (QID) | RESPIRATORY_TRACT | Status: DC | PRN

## 2017-05-30 MED ORDER — DILTIAZEM LOAD VIA INFUSION
5.0000 mg | Freq: Once | INTRAVENOUS | Status: DC
  Filled 2017-05-30: qty 5

## 2017-05-30 MED ORDER — POTASSIUM CHLORIDE 10 MEQ/100ML IV SOLN
10.0000 meq | Freq: Once | INTRAVENOUS | Status: AC
  Administered 2017-05-30: 10 meq via INTRAVENOUS
  Filled 2017-05-30: qty 100

## 2017-05-30 MED ORDER — DILTIAZEM HCL 25 MG/5ML IV SOLN
10.0000 mg | Freq: Once | INTRAVENOUS | Status: AC
  Administered 2017-05-30: 10 mg via INTRAVENOUS
  Filled 2017-05-30: qty 5

## 2017-05-30 MED ORDER — MORPHINE SULFATE (PF) 4 MG/ML IV SOLN: 2.0000 mg | INTRAVENOUS | Status: DC | PRN

## 2017-05-30 NOTE — ED Notes (Signed)
This RN attempting multiple times to ask pt to stay and reiterating pt's safety.

## 2017-05-30 NOTE — ED Notes (Signed)
Pt yelling out she has a crisis. Pt pulled out Iv in right arm. Bleeding controlled. Pt reports she needs the rest taken off because she is leaving and needs a bus pass.

## 2017-05-30 NOTE — ED Notes (Signed)
Yates MD paged about pt trying to leave AMA.

## 2017-05-30 NOTE — ED Notes (Signed)
Patient requesting to get up and sit in a chair. Explained to patient that given her heart rate and recent ativan administration it is not safe to get up and sit in a chair. Patient requesting to leave AMA

## 2017-05-30 NOTE — ED Triage Notes (Signed)
Pt brought in by [facility] from home for C P and SOB that started last night around midnight. Pt has recent abdominal sx, per EMS site looks infected, pt admits to picking at incision site, blood noted under pt nails. On initial assessment EMS states pt denied cardiac hx. Pt then states "there was this one time they gave me this medication called cardizem because my heart rate was 180". Pt given x2 nitro w/o relief, also given 324mg  aspirin PTA. Pt has hx of ETOH abuse.

## 2017-05-30 NOTE — ED Notes (Signed)
Pt attempting to leave AMA, pulling at lines. This RN reiterated to pt that it is unsafe to leave AMA. Pt repositioned into chair at bedside. Will continue to monitor.

## 2017-05-30 NOTE — Consult Note (Addendum)
Cardiology Consultation:   Patient ID: Anne Black; [id number]; 01-02-1961   Admit date: 05/30/2017 Date of Consult: 05/30/2017  Primary Care Provider: Doristine Bosworth, MD Primary Cardiologist: New, Dr Tenny Craw Primary Electrophysiologist:  n/a   Patient Profile:   Anne Black is a 57 y.o. female with a hx of ADHD, Bipolar, COPD, MS, OA, cirrhosis and abnl ECG after Novantrone for the MS, ETOH abuse, who is being seen today for the evaluation of CP/SOB and tachycardia, at the request of Dr Estell Harpin.  History of Present Illness:   Anne Black gets chest pain 2-3 x times a year. She has never been evaluated for the pain.   Yesterday she had the worst episode she has ever had. It started at about 3 pm, without exertion. It was a tightness across her whole chest. It radiated down her R arm. She thinks it was a 7/10. She was unable to sleep. No N&V, +SOB, no diaphoresis.   She called EMS this am, got nitro x 2 and ASA 324 mg, this seemed to help the tightness. The pain is now a 4/10. It has not been a 0/10 since it started.   Her heart feels like it is racing. She is not sure when it started, it has been going on all day. She is not sure if it was racing yesterday. She is not always aware of the racing, even when her heart rate is rapid.   She has an open wound on her abdomen, it has been there since surgery 12/2016.  She drank her usual amount yesterday, a bottle of wine or more. She is feeling that she is going into withdrawal.    Past Medical History:  Diagnosis Date  . ADHD (attention deficit hyperactivity disorder)   . Anxiety   . Arthritis   . Ascites   . Bipolar disorder (HCC)   . Cirrhosis (HCC)   . COPD (chronic obstructive pulmonary disease) (HCC)   . Hypokalemia   . Migraine   . Multiple sclerosis (HCC)   . Osteoporosis     Past Surgical History:  Procedure Laterality Date  . ABDOMINAL WALL DEFECT REPAIR N/A 12/30/2016   Procedure: EXPLORATORY LAPAROTOMY WITH  REPAIR ABDOMINAL WALL VENTRAL HERNIA;  Surgeon: [name], MD;  Location: [facility] OR;  Service: General;  Laterality: N/A;  . APPLICATION OF WOUND VAC N/A 12/30/2016   Procedure: APPLICATION OF WOUND VAC;  Surgeon: [name], MD;  Location: [facility] OR;  Service: General;  Laterality: N/A;  . CESAREAN SECTION  [phone]  . FRACTURE SURGERY    . HERNIA REPAIR    . MYRINGOTOMY WITH TUBE PLACEMENT Bilateral   . ORIF WRIST FRACTURE Left 09/09/2016   Procedure: OPEN REDUCTION INTERNAL FIXATION (ORIF) LEFT WRIST FRACTURE, LEFT CARPAL TUNNEL RELEASE;  Surgeon: Dominica Severin, MD;  Location: [facility] ORS;  Service: Orthopedics;  Laterality: Left;  . TONSILLECTOMY       Prior to Admission medications   Medication Sig Start Date End Date Taking? Authorizing Provider  acetaminophen (TYLENOL) 500 MG tablet Take 1 tablet (500 mg total) by mouth every 6 (six) hours as needed for mild pain or headache. 03/11/17   Arrien, York Ram, MD  albuterol (PROVENTIL HFA;VENTOLIN HFA) 108 (90 Base) MCG/ACT inhaler Inhale 2 puffs into the lungs every 6 (six) hours as needed for wheezing or shortness of breath.    [provider]  FLUoxetine (PROZAC) 20 MG capsule Take 20 mg by mouth daily.  03/30/16   [provider]  fluticasone (FLONASE) 50 MCG/ACT nasal spray Place 1 spray into both nostrils as needed for allergies or rhinitis.    [provider]  Fluticasone-Salmeterol (ADVAIR) 100-50 MCG/DOSE AEPB Inhale 1-2 puffs 2 (two) times daily into the lungs.  11/05/14   [provider]  folic acid (FOLVITE) 1 MG tablet Take 1 tablet (1 mg total) by mouth daily. Patient not taking: Reported on 05/06/2017 04/21/17   Marguerita Merles Latif, DO  furosemide (LASIX) 40 MG tablet Take 1 tablet (40 mg total) by mouth daily. 06/17/16   Midge Minium, MD  gabapentin (NEURONTIN) 300 MG capsule Take 300 mg by mouth 3 (three) times daily.    [provider]  hydrocortisone cream 1 % Apply  topically 3 (three) times daily. 04/20/17   Sheikh, Omair Latif, DO  ipratropium-albuterol (DUONEB) 0.5-2.5 (3) MG/3ML SOLN Take 3 mLs by nebulization every 6 (six) hours as needed. 03/11/17 05/06/17  Arrien, York Ram, MD  lidocaine (LIDODERM) 5 % Place 1 patch onto the skin daily. Remove & Discard patch within 12 hours or as directed by MD 04/21/17   Marguerita Merles Latif, DO  lisdexamfetamine (VYVANSE) 70 MG capsule Take 1 capsule (70 mg total) by mouth daily. 05/06/17   Gwyneth Sprout, MD  Menthol-Methyl Salicylate (MUSCLE RUB) 10-15 % CREA Apply 1 application topically as needed for muscle pain. 04/20/17   Sheikh, Omair Latif, DO  modafinil (PROVIGIL) 200 MG tablet Take 200 mg by mouth 2 (two) times daily.  08/04/16   [provider]  nicotine (NICODERM CQ - DOSED IN MG/24 HOURS) 21 mg/24hr patch Place 1 patch (21 mg total) onto the skin daily. 04/21/17   Marguerita Merles Latif, DO  ondansetron (ZOFRAN ODT) 4 MG disintegrating tablet Take 1 tablet (4 mg total) by mouth every 8 (eight) hours as needed for nausea or vomiting. 03/25/17   Renne Crigler, PA-C  oxyCODONE (OXY IR/ROXICODONE) 5 MG immediate release tablet Take 1 tablet (5 mg total) by mouth every 6 (six) hours as needed for severe pain. 04/20/17   Sheikh, Omair Latif, DO  pantoprazole (PROTONIX) 40 MG tablet Take 1 tablet (40 mg total) by mouth daily. 12/30/15   Haney, Arlyss Repress A, MD  potassium chloride SA (K-DUR,KLOR-CON) 20 MEQ tablet Take 20 mEq by mouth daily.    [provider]  predniSONE (DELTASONE) 20 MG tablet Take 2 tablets (40 mg total) by mouth daily with breakfast. 04/21/17   Marguerita Merles Latif, DO  risperiDONE (RISPERDAL) 0.5 MG tablet Take 1 tablet (0.5 mg total) by mouth 3 (three) times daily as needed (agitation). Patient taking differently: Take 0.5 mg by mouth 2 (two) times daily.  02/06/16   Renne Musca, MD  rizatriptan (MAXALT) 10 MG tablet Take 1 tablet (10 mg total) by mouth as needed for migraine.  May repeat in 2 hours if needed 03/25/17   Renne Crigler, PA-C  spironolactone (ALDACTONE) 100 MG tablet Take 1 tablet (100 mg total) by mouth daily. 11/23/16   Derwood Kaplan, MD  thiamine 100 MG tablet Take 1 tablet (100 mg total) by mouth daily. 04/21/17   Marguerita Merles Latif, DO  topiramate (TOPAMAX) 100 MG tablet Take 1 tablet (100 mg total) by mouth daily. 01/07/16   [name], MD    Inpatient Medications: Scheduled Meds:  Continuous Infusions:  Allergies:   No Known Allergies  Social History:   Social History   Socioeconomic History  . Marital status: Divorced    Spouse name: n/a  . Number of  children: 5  . Years of education: College  . Highest education level: Not on file  Occupational History  . Occupation: disabled    Comment: on social security disability for MS  Social Needs  . Financial resource strain: Not on file  . Food insecurity:    Worry: Not on file    Inability: Not on file  . Transportation needs:    Medical: Not on file    Non-medical: Not on file  Tobacco Use  . Smoking status: Current Every Day Smoker    Packs/day: 1.00    Years: 20.00    Pack years: 20.00    Types: Cigarettes  . Smokeless tobacco: Never Used  Substance and Sexual Activity  . Alcohol use: Yes    Alcohol/week: 16.8 oz    Types: 28 Glasses of wine per week    Comment: A bottle of wine or more per day.  . Drug use: No  . Sexual activity: Not Currently  Lifestyle  . Physical activity:    Days per week: Not on file    Minutes per session: Not on file  . Stress: Not on file  Relationships  . Social connections:    Talks on phone: Not on file    Gets together: Not on file    Attends religious service: Not on file    Active member of club or organization: Not on file    Attends meetings of clubs or organizations: Not on file    Relationship status: Not on file  . Intimate partner violence:    Fear of current or ex partner: Not on file    Emotionally abused:  Not on file    Physically abused: Not on file    Forced sexual activity: Not on file  Other Topics Concern  . Not on file  Social History Narrative   Former Environmental consultant.   Started graduate school in counseling, but didn't finish when a good job came open.   Lives alone.   One daughter lives in [place].   Another daughter is in [place].   The other three live in [place], along with her mother.   Father lives in the [place], [place] area.    Family History:   Family History  Problem Relation Age of Onset  . Arrhythmia Mother   . Heart disease Father   . Hypertension Father    Family Status:  Family Status  Relation Name Status  . Mother  Alive  . Father  Alive  . Daughter  Alive  . Daughter  Alive  . Daughter  Alive  . Daughter  Alive  . Daughter  Alive    ROS:  Please see the history of present illness.  All other ROS reviewed and negative.     Physical Exam/Data:   Vitals:   05/30/17 1330 05/30/17 1437 05/30/17 1449 05/30/17 1518  BP: 120/62 108/79 108/79 95/79  Pulse: (!) 150 (!) 140 (!) 140 (!) 147  Resp: 20 20  20   SpO2: 98% 98%  96%  Weight:      Height:        Intake/Output Summary (Last 24 hours) at 05/30/2017 1554 Last data filed at 05/30/2017 1537 Gross per 24 hour  Intake 100 ml  Output -  Net 100 ml   Filed Weights   05/30/17 1119  Weight: 160 lb (72.6 kg)   Body mass index is 29.26 kg/m.  General:  Well nourished, well developed, in moderate distress HEENT: normal Lymph: no  adenopathy Neck: no JVD Endocrine:  No thryomegaly Vascular: No carotid bruits; 4/4 extremity pulses 2+, without bruits  Cardiac:  normal S1, S2;  Rapid but almost completely regular R&R; no murmur  Lungs:  Scattered rales, + exp wheezing, no rhonchi   Abd: soft, nontender, no hepatomegaly  Ext: no edema Musculoskeletal:  No deformities, BUE and BLE strength normal and equal Skin: warm and dry  Neuro:  CNs 2-12 intact, no focal abnormalities  noted Psych:  Normal affect   EKG:  The EKG was personally reviewed and demonstrates: 1) MAT vs Atrial fluter; 2) Atrial flutter Telemetry:  Telemetry was personally reviewed and demonstrates:  Atrial tach with A flutter and SR at times.   Relevant CV Studies:  None  Laboratory Data:  Chemistry Recent Labs  Lab 05/30/17 1119  NA 138  K 3.3*  CL 94*  CO2 30  GLUCOSE 90  BUN 6  CREATININE 0.43*  CALCIUM 7.7*  GFRNONAA >60  GFRAA >60  ANIONGAP 14    Lab Results  Component Value Date   ALT 57 (H) 05/30/2017   AST 86 (H) 05/30/2017   ALKPHOS 107 05/30/2017   BILITOT 1.4 (H) 05/30/2017   Hematology Recent Labs  Lab 05/30/17 1119  WBC 7.5  RBC 4.53  HGB 12.4  HCT 38.2  MCV 84.3  MCH 27.4  MCHC 32.5  RDW 18.0*  PLT 126*   Cardiac Enzymes  Recent Labs  Lab 05/30/17 1128  TROPIPOC 0.00    TSH:  Lab Results  Component Value Date   TSH 3.771 08/02/2015   Lipids: Lab Results  Component Value Date   CHOL 119 08/02/2015   HDL 36 (L) 08/02/2015   LDLCALC 69 08/02/2015   TRIG 76 12/30/2016   CHOLHDL 3.3 08/02/2015   HgbA1c: Lab Results  Component Value Date   HGBA1C 5.0 12/30/2016   Magnesium:  Magnesium  Date Value Ref Range Status  04/21/2017 1.9 1.7 - 2.4 mg/dL Final    Comment:    Performed at [facility], [address], [place], [place] [address]     Radiology/Studies:  Dg Chest 2 View  Result Date: 05/30/2017 CLINICAL DATA:  Right-sided chest pain associated with shortness of breath, nausea, dizziness, and tachycardia. History of COPD, cirrhosis, multiple scleroses, current smoker. EXAM: CHEST - 2 VIEW COMPARISON:  Chest x-ray of May 06, 2017 FINDINGS: The lungs are adequately inflated. There is no focal infiltrate. There is no pleural effusion or pneumothorax. The cardiac silhouette is mildly enlarged. The pulmonary vascularity is normal. There is calcification in the wall of the aortic arch. The trachea is midline.  The bony thorax exhibits no acute abnormality. There is multilevel degenerative disc disease of the thoracic spine. IMPRESSION: Mild cardiomegaly without pulmonary edema. Mild chronic bronchitic-smoking related changes. Electronically Signed   By: David  [place] M.D.   On: 05/30/2017 11:42    Assessment and Plan:    Active Problems: 1.  Atrial fib/ flutter with rapid ventricular response (HCC)  Not clear how long pt has been in this rhythm  She does not sense palpitations.   Recomm: - IV Cardizem for now for rate control, with wheezing, no BB - For now she is not a candidate for long-term anticoag given active EtOH abuse but will hin hospital will, will start heparin  Follow H/H   Echo to evaluate LVEF, atrial size   - CHA2DS2-VASc= 2 (female, HTN)  3.  Chest pain with moderate risk for cardiac etiology -  CRFs are poorly controlled - cycle ez, ck Echo - depending on results, decide on type of ischemic eval  4.  Tobacco abuse  Smoking 1 ppd   - went ahead and wrote for the patch, otherwise, per IM  5.  Alcohol withdrawal (HCC)  Drank 6 glasses wine yesterday    - per IM - sx improved after Ativan by the ER MD  6.  Hypokalemia - s/p supplement by the ER MD  7.   Thrombocytopenia (HCC)  Plt 120s   - not seen till 04/2017  8.  Abnormal LFTs - likely 2nd ETOH   For questions or updates, please contact [facility] Please consult [email or web address] for contact info under Cardiology/STEMI.   Signed, Theodore Demark, PA-C  05/30/2017 3:54 PM    Pt seen and examined   Agree with findings as noted by R Barrett above   Pt is 56 with no history of  Heart problems   Presents with chest pressure/SOB   Found to be tachycardic   Tele and EKGs with atrial fib/atrial flutter   Duration unclear    On exam:  PT somewhat agitated   Wants to eat, wants cup of coffee.   Lungs with decreased airflwo  Mild wheezes   Cardiac Irreg rate and rhythm  Abd:   Ventral dressing with old blood   Ext wth triv  LE edema   Dirty.  Feet warm   Recomm: Would continue rate control with IV diltiazem  For now pt is not a good candidate for long term anticoagulation. Echo to evaluate LVEF, atrial dz CXR with pulmonary edema   Would replete electrolytes then diurese x 1 with lasix

## 2017-05-30 NOTE — ED Notes (Signed)
Laray Anger- Friend- [phone]

## 2017-05-30 NOTE — ED Notes (Signed)
Pt given scrubs to wear. Contacted her friend Darl Pikes to pick her up. Pt taken in wheelchair to lobby. This RN reminded pt she can return at any time for emergency. Pt states she wants to still leave AMA.

## 2017-05-30 NOTE — Progress Notes (Signed)
ANTICOAGULATION CONSULT NOTE - Initial Consult  Pharmacy Consult for Heparin Indication: atrial fibrillation  No Known Allergies  Patient Measurements: Height: 5\' 2"  (157.5 cm) Weight: 160 lb (72.6 kg) IBW/kg (Calculated) : 50.1 Heparin Dosing Weight: 65 kg  Vital Signs: BP: 95/79 (04/22 1518) Pulse Rate: 147 (04/22 1518)  Labs: Recent Labs    05/30/17 1119  HGB 12.4  HCT 38.2  PLT 126*  CREATININE 0.43*    Estimated Creatinine Clearance: 72.4 mL/min (A) (by C-G formula based on SCr of 0.43 mg/dL (L)).   Medical History: Past Medical History:  Diagnosis Date  . ADHD (attention deficit hyperactivity disorder)   . Anxiety   . Arthritis   . Ascites   . Bipolar disorder (HCC)   . Cirrhosis (HCC)   . COPD (chronic obstructive pulmonary disease) (HCC)   . Hypokalemia   . Migraine   . Multiple sclerosis (HCC)   . Narcolepsy   . Osteoporosis     Assessment: 4 YOF who presented on 4/22 with palpitations and SOB and was found to have new Afib. Pharmacy consulted to start Heparin for anticoagulation.   No anticoagulation noted PTA however with hx recent ex-lap and repair of abd wall hernia in Nov '18. No bolus requested per MD. Hgb/Hct wnl, plts 126  Goal of Therapy:  Heparin level 0.3-0.7 units/ml Monitor platelets by anticoagulation protocol: Yes   Plan:  - Start Heparin at 900 units/hr (9 ml/hr) - Daily HL, CBC - Will continue to monitor for any signs/symptoms of bleeding and will follow up with heparin level in 6 hours   Thank you for allowing pharmacy to be a part of this patient's care.  Georgina Pillion, PharmD, BCPS Clinical Pharmacist Pager: [phone] Clinical phone for 05/30/2017: [phone] If after 3:30p, please call main pharmacy at: [phone] 05/30/2017 5:09 PM

## 2017-05-30 NOTE — H&P (Addendum)
History and Physical    Anne Black VQQ:[id number] DOB: 1961-01-11 DOA: 05/30/2017  PCP: Doristine Bosworth, MD Consultants:  [facility] Patient coming from:  Home - lives alone; NOK: not sure, "I have a friend, his name is Anne Black" [phone]  Chief Complaint:  Palpitations  HPI: Anne Black is a 57 y.o. female with medical history significant of MS; ETOH dependence with possible acute withdrawal; bipolar DO; cirrhosis; and COPD not on home O2 who presents with afib with RVR.   "Well, I just had this tightness in my chest and shortness of breath.  I had it a couple of weeks ago and I had it again".  Current chest discomfort has been present for a couple of days.  Pain seems to come and go and she cannot tell what it brings it on.  +SOB, pretty much consistently.  ASA and NTG helped last time to resolve the symptoms but this time it improved but did not resolve the symptoms.    Intermittently during the history, she became tangential.  "Well, I need my Vyvance and my Provigil."  "I sure would like to have my toenails trimmed.  This is so uncomfortable.  And could you get me a [place] snadwich and some more ice chips."  Drinks "too much" alcohol - sometimes a couple, 4 glasses a day, sometimes a whole bottle a day.  She drinks wine.  Drank last drink last night, but cannot remember before that when she last did not have a drink.  +anxiety/ tremors from not drinking but denies h/o seizures.  +h/o DTs from not drinking, "maybe not quite that bad but..."  "I'm out of my medication for my narcolepsy and my ADHD because I've just been trying to survive and I'm out of those medicines and that makes it even harder to survive."  ED Course:   HR 150, Cardizem x 2, Adenosine made it look like flutter, given Lopressor.  Cardiology seeing and wants [facility] to admit her, unsure what they will do with her heart.  Review of Systems: As per HPI; otherwise review of systems reviewed and negative.   Ambulatory  Status:  Ambulates with a walker as needed  Past Medical History:  Diagnosis Date  . ADHD (attention deficit hyperactivity disorder)   . Anxiety   . Arthritis   . Ascites   . Bipolar disorder (HCC)   . Cirrhosis (HCC)   . COPD (chronic obstructive pulmonary disease) (HCC)   . Hypokalemia   . Migraine   . Multiple sclerosis (HCC)   . Narcolepsy   . Osteoporosis     Past Surgical History:  Procedure Laterality Date  . ABDOMINAL WALL DEFECT REPAIR N/A 12/30/2016   Procedure: EXPLORATORY LAPAROTOMY WITH REPAIR ABDOMINAL WALL VENTRAL HERNIA;  Surgeon: [name], MD;  Location: [facility] OR;  Service: General;  Laterality: N/A;  . APPLICATION OF WOUND VAC N/A 12/30/2016   Procedure: APPLICATION OF WOUND VAC;  Surgeon: [name], MD;  Location: [facility] OR;  Service: General;  Laterality: N/A;  . CESAREAN SECTION  [phone]  . FRACTURE SURGERY    . HERNIA REPAIR    . MYRINGOTOMY WITH TUBE PLACEMENT Bilateral   . ORIF WRIST FRACTURE Left 09/09/2016   Procedure: OPEN REDUCTION INTERNAL FIXATION (ORIF) LEFT WRIST FRACTURE, LEFT CARPAL TUNNEL RELEASE;  Surgeon: Dominica Severin, MD;  Location: [facility] ORS;  Service: Orthopedics;  Laterality: Left;  . TONSILLECTOMY      Social History   Socioeconomic History  . Marital status:  Divorced    Spouse name: n/a  . Number of children: 5  . Years of education: College  . Highest education level: Not on file  Occupational History  . Occupation: disabled    Comment: on social security disability for MS  Social Needs  . Financial resource strain: Not on file  . Food insecurity:    Worry: Not on file    Inability: Not on file  . Transportation needs:    Medical: Not on file    Non-medical: Not on file  Tobacco Use  . Smoking status: Current Every Day Smoker    Packs/day: 1.00    Years: 35.00    Pack years: 35.00    Types: Cigarettes  . Smokeless tobacco: Never Used  Substance and Sexual Activity  . Alcohol use: Yes    Alcohol/week:  16.8 oz    Types: 28 Glasses of wine per week    Comment: A bottle of wine or more per day.  . Drug use: No  . Sexual activity: Not Currently  Lifestyle  . Physical activity:    Days per week: Not on file    Minutes per session: Not on file  . Stress: Not on file  Relationships  . Social connections:    Talks on phone: Not on file    Gets together: Not on file    Attends religious service: Not on file    Active member of club or organization: Not on file    Attends meetings of clubs or organizations: Not on file    Relationship status: Not on file  . Intimate partner violence:    Fear of current or ex partner: Not on file    Emotionally abused: Not on file    Physically abused: Not on file    Forced sexual activity: Not on file  Other Topics Concern  . Not on file  Social History Narrative   Former Environmental consultant.   Started graduate school in counseling, but didn't finish when a good job came open.   Lives alone.   One daughter lives in [place].   Another daughter is in [place].   The other three live in [place], along with her mother.   Father lives in the [place], [place] area.    No Known Allergies  Family History  Problem Relation Age of Onset  . Arrhythmia Mother   . Heart disease Father   . Hypertension Father     Prior to Admission medications   Medication Sig Start Date End Date Taking? Authorizing Provider  acetaminophen (TYLENOL) 500 MG tablet Take 1 tablet (500 mg total) by mouth every 6 (six) hours as needed for mild pain or headache. 03/11/17   Arrien, York Ram, MD  albuterol (PROVENTIL HFA;VENTOLIN HFA) 108 (90 Base) MCG/ACT inhaler Inhale 2 puffs into the lungs every 6 (six) hours as needed for wheezing or shortness of breath.    [provider]  FLUoxetine (PROZAC) 20 MG capsule Take 20 mg by mouth daily.  03/30/16   [provider]  fluticasone (FLONASE) 50 MCG/ACT nasal spray Place 1 spray into both nostrils as  needed for allergies or rhinitis.    [provider]  Fluticasone-Salmeterol (ADVAIR) 100-50 MCG/DOSE AEPB Inhale 1-2 puffs 2 (two) times daily into the lungs.  11/05/14   [provider]  folic acid (FOLVITE) 1 MG tablet Take 1 tablet (1 mg total) by mouth daily. Patient not taking: Reported on 05/06/2017 04/21/17   Merlene Laughter, DO  furosemide (LASIX) 40 MG tablet Take 1 tablet (40 mg total) by mouth daily. 06/17/16   Midge Minium, MD  gabapentin (NEURONTIN) 300 MG capsule Take 300 mg by mouth 3 (three) times daily.    [provider]  hydrocortisone cream 1 % Apply topically 3 (three) times daily. 04/20/17   Sheikh, Omair Latif, DO  ipratropium-albuterol (DUONEB) 0.5-2.5 (3) MG/3ML SOLN Take 3 mLs by nebulization every 6 (six) hours as needed. 03/11/17 05/06/17  Arrien, York Ram, MD  lidocaine (LIDODERM) 5 % Place 1 patch onto the skin daily. Remove & Discard patch within 12 hours or as directed by MD 04/21/17   Marguerita Merles Latif, DO  lisdexamfetamine (VYVANSE) 70 MG capsule Take 1 capsule (70 mg total) by mouth daily. 05/06/17   Gwyneth Sprout, MD  Menthol-Methyl Salicylate (MUSCLE RUB) 10-15 % CREA Apply 1 application topically as needed for muscle pain. 04/20/17   Sheikh, Omair Latif, DO  modafinil (PROVIGIL) 200 MG tablet Take 200 mg by mouth 2 (two) times daily.  08/04/16   [provider]  nicotine (NICODERM CQ - DOSED IN MG/24 HOURS) 21 mg/24hr patch Place 1 patch (21 mg total) onto the skin daily. 04/21/17   Marguerita Merles Latif, DO  ondansetron (ZOFRAN ODT) 4 MG disintegrating tablet Take 1 tablet (4 mg total) by mouth every 8 (eight) hours as needed for nausea or vomiting. 03/25/17   Renne Crigler, PA-C  oxyCODONE (OXY IR/ROXICODONE) 5 MG immediate release tablet Take 1 tablet (5 mg total) by mouth every 6 (six) hours as needed for severe pain. 04/20/17   Sheikh, Omair Latif, DO  pantoprazole (PROTONIX) 40 MG tablet Take 1 tablet (40 mg total) by  mouth daily. 12/30/15   Haney, Arlyss Repress A, MD  potassium chloride SA (K-DUR,KLOR-CON) 20 MEQ tablet Take 20 mEq by mouth daily.    [provider]  predniSONE (DELTASONE) 20 MG tablet Take 2 tablets (40 mg total) by mouth daily with breakfast. 04/21/17   Marguerita Merles Latif, DO  risperiDONE (RISPERDAL) 0.5 MG tablet Take 1 tablet (0.5 mg total) by mouth 3 (three) times daily as needed (agitation). Patient taking differently: Take 0.5 mg by mouth 2 (two) times daily.  02/06/16   Renne Musca, MD  rizatriptan (MAXALT) 10 MG tablet Take 1 tablet (10 mg total) by mouth as needed for migraine. May repeat in 2 hours if needed 03/25/17   Renne Crigler, PA-C  spironolactone (ALDACTONE) 100 MG tablet Take 1 tablet (100 mg total) by mouth daily. 11/23/16   Derwood Kaplan, MD  thiamine 100 MG tablet Take 1 tablet (100 mg total) by mouth daily. 04/21/17   Marguerita Merles Latif, DO  topiramate (TOPAMAX) 100 MG tablet Take 1 tablet (100 mg total) by mouth daily. 01/07/16   [name], MD    Physical Exam: Vitals:   05/30/17 1330 05/30/17 1437 05/30/17 1449 05/30/17 1518  BP: 120/62 108/79 108/79 95/79  Pulse: (!) 150 (!) 140 (!) 140 (!) 147  Resp: 20 20  20   SpO2: 98% 98%  96%  Weight:      Height:         General:  Appears anxious and somewhat uncomfortable but is NAD Eyes:  PERRL, EOMI, normal lids, iris ENT:  grossly normal hearing, lips & tongue, mmm Neck:  no LAD, masses or thyromegaly Cardiovascular:  Irregularly irregular with tachycardia consistently in 140s, no m/r/g. No LE edema.  Respiratory:   CTA bilaterally with no wheezes/rales/rhonchi.  Normal respiratory effort. Abdomen:  soft, NT, ND, NABS Back:   normal alignment, no CVAT Skin:  no rash or induration seen on limited exam Musculoskeletal:  grossly normal tone BUE/BLE, good ROM, no bony abnormality Lower extremity:  No LE edema.  Limited foot exam with no ulcerations.  2+ distal pulses. Psychiatric:  Anxious  mood and affect, speech fluent and appropriate, AOx3, somewhat inappropriate Neurologic:  CN 2-12 grossly intact, moves all extremities in coordinated fashion, sensation intact    Radiological Exams on Admission: Dg Chest 2 View  Result Date: 05/30/2017 CLINICAL DATA:  Right-sided chest pain associated with shortness of breath, nausea, dizziness, and tachycardia. History of COPD, cirrhosis, multiple scleroses, current smoker. EXAM: CHEST - 2 VIEW COMPARISON:  Chest x-ray of May 06, 2017 FINDINGS: The lungs are adequately inflated. There is no focal infiltrate. There is no pleural effusion or pneumothorax. The cardiac silhouette is mildly enlarged. The pulmonary vascularity is normal. There is calcification in the wall of the aortic arch. The trachea is midline. The bony thorax exhibits no acute abnormality. There is multilevel degenerative disc disease of the thoracic spine. IMPRESSION: Mild cardiomegaly without pulmonary edema. Mild chronic bronchitic-smoking related changes. Electronically Signed   By: David  [place] M.D.   On: 05/30/2017 11:42    EKG: Independently reviewed.  Aflutter with rate 147; nonspecific ST changes with no evidence of acute ischemia   Labs on Admission: I have personally reviewed the available labs and imaging studies at the time of the admission.  Pertinent labs:   K+ 3.3 AST 86/ALT 57/Bili 1.4 Troponin 0.00 Platelets 126 - stable HCG negative ETOH 263  Assessment/Plan Principal Problem:   Atrial flutter with rapid ventricular response (HCC) Active Problems:   Tobacco abuse   Stimulant abuse (HCC)   Alcohol withdrawal (HCC)   Hypokalemia   Thrombocytopenia (HCC)   COPD (chronic obstructive pulmonary disease) (HCC)   Chest pain with moderate risk for cardiac etiology   Abnormal LFTs   Atrial flutter with RVR and chest pain   -Patient presenting with new-onset aflutter.  -Etiology is thought to be related to ETOH withdrawal, although this is not  certain.  -Since the afib onset is unknown, will focus on rate control and searching for the underlying cause at this time. -Will admit to SDU for Diltiazem drip as per protocol with plan to transition to PO Diltiazem once heart rate is controlled.   -Troponin q6h x 3 given chest pain; she may require ischemic evaluation once atrial flutter is controlled. -Will give ASA 81 mg PO daily.   -Treat chest pain with NTG prn and morphine. -Will request Echocardiogram for further evaluation  -Will risk stratify with FLP and HgbA1c; will also order TSH/free T4 and UDS.  -Consult cardiology. -CHA2DS2-VASc Score is >2 and so patient will need oral anticoagulation. Currently with heparin drip. -Because atrial flutter is often refractory to medications, the patient may require DCCV.  However, since she has had this for an uncertain period of time, will anticoagulate first and attempt chemical rate control.  ETOH dependence with withdrawal -Patient with chronic ETOH dependence -She is at high risk for complications of withdrawal including seizures, DTs -Will observe on SDU -CIWA protocol -Elevated LFTs are likely related to alcoholism  Thrombocytopenia - Due to bone marrow suppression from alcohol abuse - Monitor daily platelet count   Stimulant abuse -Patient requesting Vyvanse and Provigil -Hold medications - she will not be performing tasks that require attention and focus as an inpatient  Hypokalemia -Repleted in ER -Recheck  in AM  COPD -Does not appear to be in exacerbation at this time. -Atrovent/Xopenex q6h prn SOB/wheezing.  Tobacco dependence -Encourage cessation.  This was discussed with the patient and should be reviewed on an ongoing basis.   -Patch ordered at patient request.  DVT prophylaxis: Heparin Code Status:  Full - confirmed with patient Family Communication: None present Disposition Plan:  Home once clinically improved Consults called: Cardiology  Admission status:  Admit - It is my clinical opinion that admission to INPATIENT is reasonable and necessary because of the expectation that this patient will require hospital care that crosses at least 2 midnights to treat this condition based on the medical complexity of the problems presented.  Given the aforementioned information, the predictability of an adverse outcome is felt to be significant.   Note: Patient is threatening to leave AMA.  She has been informed that she is at high risk of death if she chooses to leave and has been encouraged to stay.  Morphine and Ativan ordered, as above.   Jonah Blue MD [facility]  If note is complete, please contact covering daytime or nighttime physician. [email or web address] Password [email or web address]  05/30/2017, 5:30 PM

## 2017-05-30 NOTE — ED Notes (Signed)
Cardiology at bedside.

## 2017-05-30 NOTE — ED Provider Notes (Signed)
[facility] EMERGENCY DEPARTMENT Provider Note   CSN: [id number] Arrival date & time: 05/30/17  1056     History   Chief Complaint Chief Complaint  Patient presents with  . Chest Pain  . Shortness of Breath    HPI Anne Black is a 57 y.o. female.  Patient complains of palpitations and some shortness of breath  The history is provided by the patient. No language interpreter was used.  Palpitations   This is a new problem. The problem occurs constantly. The problem has not changed since onset.The problem is associated with an unknown factor. Pertinent negatives include no diaphoresis, no chest pain, no abdominal pain, no headaches, no back pain and no cough.    Past Medical History:  Diagnosis Date  . ADHD (attention deficit hyperactivity disorder)   . Anxiety   . Arthritis   . Ascites   . Bipolar disorder (HCC)   . Cirrhosis (HCC)   . COPD (chronic obstructive pulmonary disease) (HCC)   . Hypokalemia   . Migraine   . Multiple sclerosis (HCC)   . Osteoporosis     Patient Active Problem List   Diagnosis Date Noted  . COPD (chronic obstructive pulmonary disease) (HCC) 04/19/2017  . Right rib fracture 04/15/2017  . Abdominal wall cellulitis 12/30/2016  . S/P exploratory laparotomy 12/30/2016  . Distal radius fracture, left 09/09/2016  . Thrombocytopenia (HCC) 09/09/2016  . Chest x-ray abnormality   . Umbilical hernia without obstruction and without gangrene 05/12/2016  . Itching 02/24/2016  . Ventral hernia without obstruction or gangrene 02/24/2016  . Palliative care encounter   . Ascites   . Tachypnea   . Typical atrial flutter (HCC)   . Hypomagnesemia   . Hypoxia   . Dehydration 01/22/2016  . Low back ache 12/30/2015  . Non-intractable vomiting with nausea   . Alcohol abuse with alcohol-induced mood disorder (HCC) 12/15/2015  . Acute alcoholism (HCC)   . Abdominal distension 11/04/2015  . Alcohol abuse   . Dyspnea   . Acute  respiratory failure (HCC) 10/15/2015  . Ascites due to alcoholic cirrhosis (HCC) 10/15/2015  . Osteoporosis   . Bipolar I disorder (HCC)   . Multiple sclerosis (HCC)   . Alcoholic cirrhosis of liver with ascites (HCC) 08/22/2015  . Bipolar disorder, current episode mixed, moderate (HCC) 08/01/2015  . Alcohol use disorder, severe, dependence (HCC) 07/31/2015  . Macrocytic anemia- due to alcohol abuse with normal B12 & folate levels 05/31/2015  . Severe protein-calorie malnutrition (HCC) 05/31/2015  . Hypokalemia 05/26/2015  . Encephalopathy, hepatic (HCC) 05/26/2015  . Alcohol withdrawal (HCC) 05/18/2015  . Abdominal pain 05/18/2015  . Anxiety 05/18/2015  . UTI (lower urinary tract infection) 05/18/2015  . Stimulant abuse (HCC) 12/27/2014  . Nicotine abuse 12/27/2014  . Hyperprolactinemia (HCC) 11/15/2014  . Dyslipidemia   . Tobacco abuse 01/23/2014  . Noncompliance with therapeutic plan 04/04/2013  . Unspecified hereditary and idiopathic peripheral neuropathy 05/01/2012  . GERD (gastroesophageal reflux disease) 05/01/2012  . Osteoarthrosis, unspecified whether generalized or localized, involving lower leg 05/01/2012  . Hyponatremia 07/01/2011    Past Surgical History:  Procedure Laterality Date  . ABDOMINAL WALL DEFECT REPAIR N/A 12/30/2016   Procedure: EXPLORATORY LAPAROTOMY WITH REPAIR ABDOMINAL WALL VENTRAL HERNIA;  Surgeon: [name], MD;  Location: [facility] OR;  Service: General;  Laterality: N/A;  . APPLICATION OF WOUND VAC N/A 12/30/2016   Procedure: APPLICATION OF WOUND VAC;  Surgeon: [name], MD;  Location: [facility] OR;  Service: General;  Laterality: N/A;  . CESAREAN SECTION  [id number]  . FRACTURE SURGERY    . HERNIA REPAIR    . MYRINGOTOMY WITH TUBE PLACEMENT Bilateral   . ORIF WRIST FRACTURE Left 09/09/2016   Procedure: OPEN REDUCTION INTERNAL FIXATION (ORIF) LEFT WRIST FRACTURE, LEFT CARPAL TUNNEL RELEASE;  Surgeon: Dominica Severin, MD;  Location: [facility] ORS;   Service: Orthopedics;  Laterality: Left;  . TONSILLECTOMY       OB History   None      Home Medications    Prior to Admission medications   Medication Sig Start Date End Date Taking? Authorizing Provider  acetaminophen (TYLENOL) 500 MG tablet Take 1 tablet (500 mg total) by mouth every 6 (six) hours as needed for mild pain or headache. 03/11/17   Arrien, York Ram, MD  albuterol (PROVENTIL HFA;VENTOLIN HFA) 108 (90 Base) MCG/ACT inhaler Inhale 2 puffs into the lungs every 6 (six) hours as needed for wheezing or shortness of breath.    [provider]  FLUoxetine (PROZAC) 20 MG capsule Take 20 mg by mouth daily.  03/30/16   [provider]  fluticasone (FLONASE) 50 MCG/ACT nasal spray Place 1 spray into both nostrils as needed for allergies or rhinitis.    [provider]  Fluticasone-Salmeterol (ADVAIR) 100-50 MCG/DOSE AEPB Inhale 1-2 puffs 2 (two) times daily into the lungs.  11/05/14   [provider]  folic acid (FOLVITE) 1 MG tablet Take 1 tablet (1 mg total) by mouth daily. Patient not taking: Reported on 05/06/2017 04/21/17   Marguerita Merles Latif, DO  furosemide (LASIX) 40 MG tablet Take 1 tablet (40 mg total) by mouth daily. 06/17/16   Midge Minium, MD  gabapentin (NEURONTIN) 300 MG capsule Take 300 mg by mouth 3 (three) times daily.    [provider]  hydrocortisone cream 1 % Apply topically 3 (three) times daily. 04/20/17   Sheikh, Omair Latif, DO  ipratropium-albuterol (DUONEB) 0.5-2.5 (3) MG/3ML SOLN Take 3 mLs by nebulization every 6 (six) hours as needed. 03/11/17 05/06/17  Arrien, York Ram, MD  lidocaine (LIDODERM) 5 % Place 1 patch onto the skin daily. Remove & Discard patch within 12 hours or as directed by MD 04/21/17   Marguerita Merles Latif, DO  lisdexamfetamine (VYVANSE) 70 MG capsule Take 1 capsule (70 mg total) by mouth daily. 05/06/17   Gwyneth Sprout, MD  Menthol-Methyl Salicylate (MUSCLE RUB) 10-15 % CREA Apply 1  application topically as needed for muscle pain. 04/20/17   Sheikh, Omair Latif, DO  modafinil (PROVIGIL) 200 MG tablet Take 200 mg by mouth 2 (two) times daily.  08/04/16   [provider]  nicotine (NICODERM CQ - DOSED IN MG/24 HOURS) 21 mg/24hr patch Place 1 patch (21 mg total) onto the skin daily. 04/21/17   Marguerita Merles Latif, DO  ondansetron (ZOFRAN ODT) 4 MG disintegrating tablet Take 1 tablet (4 mg total) by mouth every 8 (eight) hours as needed for nausea or vomiting. 03/25/17   Renne Crigler, PA-C  oxyCODONE (OXY IR/ROXICODONE) 5 MG immediate release tablet Take 1 tablet (5 mg total) by mouth every 6 (six) hours as needed for severe pain. 04/20/17   Sheikh, Omair Latif, DO  pantoprazole (PROTONIX) 40 MG tablet Take 1 tablet (40 mg total) by mouth daily. 12/30/15   Haney, Arlyss Repress A, MD  potassium chloride SA (K-DUR,KLOR-CON) 20 MEQ tablet Take 20 mEq by mouth daily.    [provider]  predniSONE (DELTASONE) 20 MG tablet Take 2 tablets (40 mg total) by  mouth daily with breakfast. 04/21/17   Marguerita Merles Latif, DO  risperiDONE (RISPERDAL) 0.5 MG tablet Take 1 tablet (0.5 mg total) by mouth 3 (three) times daily as needed (agitation). Patient taking differently: Take 0.5 mg by mouth 2 (two) times daily.  02/06/16   Renne Musca, MD  rizatriptan (MAXALT) 10 MG tablet Take 1 tablet (10 mg total) by mouth as needed for migraine. May repeat in 2 hours if needed 03/25/17   Renne Crigler, PA-C  spironolactone (ALDACTONE) 100 MG tablet Take 1 tablet (100 mg total) by mouth daily. 11/23/16   Derwood Kaplan, MD  thiamine 100 MG tablet Take 1 tablet (100 mg total) by mouth daily. 04/21/17   Marguerita Merles Latif, DO  topiramate (TOPAMAX) 100 MG tablet Take 1 tablet (100 mg total) by mouth daily. 01/07/16   [name], MD    Family History Family History  Problem Relation Age of Onset  . Arrhythmia Mother   . Heart disease Father   . Hypertension Father     Social  History Social History   Tobacco Use  . Smoking status: Current Every Day Smoker    Packs/day: 1.00    Years: 20.00    Pack years: 20.00    Types: Cigarettes  . Smokeless tobacco: Never Used  . Tobacco comment: Currently Vaping Daily  Substance Use Topics  . Alcohol use: Yes    Alcohol/week: 16.8 oz    Types: 28 Glasses of wine per week    Comment: A bottle of wine or more per day.  . Drug use: No     Allergies   Patient has no known allergies.   Review of Systems Review of Systems  Constitutional: Negative for appetite change, diaphoresis and fatigue.  HENT: Negative for congestion, ear discharge and sinus pressure.   Eyes: Negative for discharge.  Respiratory: Negative for cough.   Cardiovascular: Positive for palpitations. Negative for chest pain.  Gastrointestinal: Negative for abdominal pain and diarrhea.  Genitourinary: Negative for frequency and hematuria.  Musculoskeletal: Negative for back pain.  Skin: Negative for rash.  Neurological: Negative for seizures and headaches.  Psychiatric/Behavioral: Negative for hallucinations.     Physical Exam Updated Vital Signs BP 95/79   Pulse (!) 147   Resp 20   Ht 5\' 2"  (1.575 m)   Wt 72.6 kg (160 lb)   SpO2 96%   BMI 29.26 kg/m   Physical Exam  Constitutional: She is oriented to person, place, and time. She appears well-developed.  HENT:  Head: Normocephalic.  Eyes: Conjunctivae and EOM are normal. No scleral icterus.  Neck: Neck supple. No thyromegaly present.  Cardiovascular: Normal rate and regular rhythm. Exam reveals no gallop and no friction rub.  No murmur heard. Pulmonary/Chest: No stridor. She has no wheezes. She has no rales. She exhibits no tenderness.  Abdominal: She exhibits no distension. There is no tenderness. There is no rebound.  Musculoskeletal: Normal range of motion. She exhibits no edema.  Lymphadenopathy:    She has no cervical adenopathy.  Neurological: She is oriented to person,  place, and time. She exhibits normal muscle tone. Coordination normal.  Skin: No rash noted. No erythema.  Psychiatric: She has a normal mood and affect. Her behavior is normal.     ED Treatments / Results  Labs (all labs ordered are listed, but only abnormal results are displayed) Labs Reviewed  BASIC METABOLIC PANEL - Abnormal; Notable for the following components:      Result Value  Potassium 3.3 (*)    Chloride 94 (*)    Creatinine, Ser 0.43 (*)    Calcium 7.7 (*)    All other components within normal limits  CBC - Abnormal; Notable for the following components:   RDW 18.0 (*)    Platelets 126 (*)    All other components within normal limits  HEPATIC FUNCTION PANEL - Abnormal; Notable for the following components:   Total Protein 6.0 (*)    Albumin 3.4 (*)    AST 86 (*)    ALT 57 (*)    Total Bilirubin 1.4 (*)    All other components within normal limits  ETHANOL - Abnormal; Notable for the following components:   Alcohol, Ethyl (B) 263 (*)    All other components within normal limits  DIFFERENTIAL  I-STAT TROPONIN, ED  I-STAT BETA HCG BLOOD, ED ([facility], [facility], [facility] ONLY)    EKG None  Radiology Dg Chest 2 View  Result Date: 05/30/2017 CLINICAL DATA:  Right-sided chest pain associated with shortness of breath, nausea, dizziness, and tachycardia. History of COPD, cirrhosis, multiple scleroses, current smoker. EXAM: CHEST - 2 VIEW COMPARISON:  Chest x-ray of May 06, 2017 FINDINGS: The lungs are adequately inflated. There is no focal infiltrate. There is no pleural effusion or pneumothorax. The cardiac silhouette is mildly enlarged. The pulmonary vascularity is normal. There is calcification in the wall of the aortic arch. The trachea is midline. The bony thorax exhibits no acute abnormality. There is multilevel degenerative disc disease of the thoracic spine. IMPRESSION: Mild cardiomegaly without pulmonary edema. Mild chronic bronchitic-smoking related changes. Electronically  Signed   By: David  [place] M.D.   On: 05/30/2017 11:42    Procedures Procedures (including critical care time)  Medications Ordered in ED Medications  potassium chloride 10 mEq in 100 mL IVPB (10 mEq Intravenous New Bag/Given 05/30/17 1437)  diltiazem (CARDIZEM) injection 10 mg (10 mg Intravenous Given 05/30/17 1148)  adenosine (ADENOCARD) 6 MG/2ML injection 6 mg (6 mg Intravenous Given 05/30/17 1302)  diltiazem (CARDIZEM) injection 10 mg (10 mg Intravenous Given 05/30/17 1312)  metoprolol tartrate (LOPRESSOR) injection 5 mg (5 mg Intravenous Given 05/30/17 1335)  ondansetron (ZOFRAN) injection 4 mg (4 mg Intravenous Given 05/30/17 1515)  LORazepam (ATIVAN) injection 0.5 mg (0.5 mg Intravenous Given 05/30/17 1514)    CRITICAL CARE Performed by: Bethann Berkshire Total critical care time: Critical care time was exclusive of separately billable procedures and treating other patients. Critical care was necessary to treat or prevent imminent or life-threatening deterioration. Critical care was time spent personally by me on the following activities: development of treatment plan with patient and/or surrogate as well as nursing, discussions with consultants, evaluation of patient's response to treatment, examination of patient, obtaining history from patient or surrogate, ordering and performing treatments and interventions, ordering and review of laboratory studies, ordering and review of radiographic studies, pulse oximetry and re-evaluation of patient's condition.  Initial Impression / Assessment and Plan / ED Course  I have reviewed the triage vital signs and the nursing notes.  Pertinent labs & imaging results that were available during my care of the patient were reviewed by me and considered in my medical decision making (see chart for details).     Patient with palpitations.  Suspect atrial flutter.  Patient was given Cardizem twice adenosine and Lopressor without significant  improvement of the tachycardia.  Cardiology consulted medicine is admitting Final Clinical Impressions(s) / ED Diagnoses   Final diagnoses:  Palpitations  ED Discharge Orders    None       Bethann Berkshire, MD 05/30/17 1524

## 2017-05-30 NOTE — ED Notes (Signed)
Ophelia Charter, MD notified about pt leaving AMA.

## 2017-05-30 NOTE — ED Notes (Signed)
Pt constantly yelling out for help. Pt assisted to bedside commode.

## 2017-06-01 ENCOUNTER — Encounter (HOSPITAL_COMMUNITY): Payer: Self-pay | Admitting: Emergency Medicine

## 2017-06-01 ENCOUNTER — Ambulatory Visit: Payer: Self-pay

## 2017-06-01 ENCOUNTER — Emergency Department (HOSPITAL_COMMUNITY)
Admission: EM | Admit: 2017-06-01 | Discharge: 2017-06-01 | Disposition: A | Discharge status: Home / Self Care | Payer: No Typology Code available for payment source | Attending: Emergency Medicine | Admitting: Emergency Medicine

## 2017-06-01 DIAGNOSIS — Z5321 Procedure and treatment not carried out due to patient leaving prior to being seen by health care provider: Secondary | ICD-10-CM | POA: Diagnosis not present

## 2017-06-01 DIAGNOSIS — R079 Chest pain, unspecified: Secondary | ICD-10-CM | POA: Insufficient documentation

## 2017-06-01 DIAGNOSIS — R0602 Shortness of breath: Secondary | ICD-10-CM

## 2017-06-01 LAB — I-STAT TROPONIN, ED: TROPONIN I, POC: 0.03 ng/mL (ref 0.00–0.08)

## 2017-06-01 LAB — COMPREHENSIVE METABOLIC PANEL
ALT: 65 U/L — ABNORMAL HIGH (ref 14–54)
AST: 86 U/L — ABNORMAL HIGH (ref 15–41)
Albumin: 3.4 g/dL — ABNORMAL LOW (ref 3.5–5.0)
Alkaline Phosphatase: 129 U/L — ABNORMAL HIGH (ref 38–126)
Anion gap: 8 (ref 5–15)
BUN: 6 mg/dL (ref 6–20)
CO2: 32 mmol/L (ref 22–32)
Calcium: 8.3 mg/dL — ABNORMAL LOW (ref 8.9–10.3)
Chloride: 100 mmol/L — ABNORMAL LOW (ref 101–111)
Creatinine, Ser: 0.56 mg/dL (ref 0.44–1.00)
GFR calc Af Amer: >60 mL/min (ref >60)
GFR calc non Af Amer: >60 mL/min (ref >60)
Glucose, Bld: 101 mg/dL — ABNORMAL HIGH (ref 65–99)
Potassium: 3.7 mmol/L (ref 3.5–5.1)
Sodium: 140 mmol/L (ref 135–145)
Total Bilirubin: 1.3 mg/dL — ABNORMAL HIGH (ref 0.3–1.2)
Total Protein: 6 g/dL — ABNORMAL LOW (ref 6.5–8.1)

## 2017-06-01 LAB — CBC
HCT: 34.6 % — ABNORMAL LOW (ref 36.0–46.0)
HEMOGLOBIN: 10.5 g/dL — AB (ref 12.0–15.0)
MCH: 26.3 pg (ref 26.0–34.0)
MCHC: 30.3 g/dL (ref 30.0–36.0)
MCV: 86.5 fL (ref 78.0–100.0)
Platelets: 84 10*3/uL — ABNORMAL LOW (ref 150–400)
RBC: 4 MIL/uL (ref 3.87–5.11)
RDW: 18.3 % — ABNORMAL HIGH (ref 11.5–15.5)
WBC: 7.8 10*3/uL (ref 4.0–10.5)

## 2017-06-01 LAB — I-STAT BETA HCG BLOOD, ED (MC, WL, AP ONLY): I-stat hCG, quantitative: <5 m[IU]/mL (ref <5)

## 2017-06-01 LAB — LIPASE, BLOOD: Lipase: 31 U/L (ref 11–51)

## 2017-06-01 LAB — ETHANOL: Alcohol, Ethyl (B): 273 mg/dL — ABNORMAL HIGH (ref <10)

## 2017-06-01 NOTE — ED Notes (Signed)
Patient seen leaving in cab, several staff members spoke with her prior to her leaving about staying.  Patient refused.  Will d/c.

## 2017-06-01 NOTE — Telephone Encounter (Signed)
Patient called in with c/o "chest pain." She says "I was in the emergency room yesterday and diagnosed with an arrhythmia. I am going to call 911, this is terrible." I advised to go ahead and call, she verbalized understanding.

## 2017-06-01 NOTE — ED Triage Notes (Signed)
BIB EMS from home, pt reports R sided CP onset earlier this afternoon. Pt was seen yesterday and was noted to be in afib RVR, pt left AMA from hospital before admission. ETOH on board

## 2017-06-01 NOTE — ED Provider Notes (Cosign Needed Addendum)
Patient placed in Quick Look pathway, seen and evaluated   Chief Complaint: Chest pain, shortness of breath  HPI:   Patient is a 57 year old female with history of alcohol abuse, cirrhosis, COPD, MS, bipolar disorder who presents with chest pain and shortness of breath.  Patient was evaluated yesterday and suspected to be in atrial flutter.  It was recommended that she be admitted to the hospital, however patient left AGAINST MEDICAL ADVICE.  She regrets this decision.  She has had worsening chest pain shortness of breath since yesterday.  She is also had abdominal pain and nausea and vomiting.  She is recovering from an umbilical hernia surgery.  She denies fevers.  ROS: Positive for chest pain, shortness of breath, abdominal pain, nausea, vomiting.  Negative for fevers.  Physical Exam:   Gen: No distress  Neuro: Awake and Alert  Skin: Warm    Focused Exam: Heart normal rate and rhythm, lungs are clear to auscultation, however patient requiring oxygen, O2 sats 89%, mild generalized abdominal tenderness, healing wound to the umbilicus with some yellow drainage on bandage, small amount of bleeding to the bottom of the wound after bandage removed (changed by me)  CBC, CMP, lipase, troponin, ethanol, EKG, chest x-ray ordered from triage.  Initiation of care has begun. The patient has been counseled on the process, plan, and necessity for staying for the completion/evaluation, and the remainder of the medical screening examination      Emi Holes, PA-C 06/01/17 1938

## 2017-06-02 NOTE — Telephone Encounter (Signed)
PEC message sent to Dr. Joella Prince - pt went to ED

## 2017-06-09 ENCOUNTER — Emergency Department (HOSPITAL_COMMUNITY)
Admission: EM | Admit: 2017-06-09 | Discharge: 2017-06-10 | Discharge status: Against Medical Advice | Payer: No Typology Code available for payment source | Attending: Emergency Medicine | Admitting: Emergency Medicine

## 2017-06-09 ENCOUNTER — Encounter (HOSPITAL_COMMUNITY): Payer: Self-pay | Admitting: Emergency Medicine

## 2017-06-09 DIAGNOSIS — F1020 Alcohol dependence, uncomplicated: Secondary | ICD-10-CM | POA: Diagnosis present

## 2017-06-09 DIAGNOSIS — Z5321 Procedure and treatment not carried out due to patient leaving prior to being seen by health care provider: Secondary | ICD-10-CM | POA: Diagnosis not present

## 2017-06-09 NOTE — ED Notes (Signed)
Called Pt in lobby for vital recheck, no response x2. Do not see Pt in lobby.

## 2017-06-09 NOTE — ED Notes (Signed)
Called Pt in lobby for vital recheck, no response x1. Do not see Pt in lobby. 

## 2017-06-09 NOTE — ED Notes (Signed)
Called Pt, no response in lobby x3. Do not see Pt in lobby.

## 2017-06-09 NOTE — ED Triage Notes (Signed)
Per EMS-here for alcohol detox-patient has had 1 glass of wine today-complaining of right hip pain-CBG 109

## 2017-06-10 ENCOUNTER — Ambulatory Visit: Payer: Self-pay | Admitting: Physician Assistant

## 2017-06-12 ENCOUNTER — Emergency Department (HOSPITAL_COMMUNITY): Payer: No Typology Code available for payment source

## 2017-06-12 ENCOUNTER — Inpatient Hospital Stay (HOSPITAL_COMMUNITY)
Admission: EM | Admit: 2017-06-12 | Discharge: 2017-06-14 | DRG: 190 | Discharge status: Against Medical Advice | Payer: No Typology Code available for payment source | Attending: Internal Medicine | Admitting: Internal Medicine

## 2017-06-12 ENCOUNTER — Other Ambulatory Visit: Payer: Self-pay

## 2017-06-12 ENCOUNTER — Encounter (HOSPITAL_COMMUNITY): Payer: Self-pay

## 2017-06-12 DIAGNOSIS — R140 Abdominal distension (gaseous): Secondary | ICD-10-CM | POA: Diagnosis present

## 2017-06-12 DIAGNOSIS — F419 Anxiety disorder, unspecified: Secondary | ICD-10-CM | POA: Diagnosis present

## 2017-06-12 DIAGNOSIS — F10239 Alcohol dependence with withdrawal, unspecified: Secondary | ICD-10-CM | POA: Diagnosis present

## 2017-06-12 DIAGNOSIS — R0602 Shortness of breath: Secondary | ICD-10-CM | POA: Diagnosis not present

## 2017-06-12 DIAGNOSIS — Y908 Blood alcohol level of 240 mg/100 ml or more: Secondary | ICD-10-CM | POA: Diagnosis present

## 2017-06-12 DIAGNOSIS — F319 Bipolar disorder, unspecified: Secondary | ICD-10-CM | POA: Diagnosis not present

## 2017-06-12 DIAGNOSIS — R5381 Other malaise: Secondary | ICD-10-CM | POA: Diagnosis present

## 2017-06-12 DIAGNOSIS — K7011 Alcoholic hepatitis with ascites: Secondary | ICD-10-CM | POA: Diagnosis not present

## 2017-06-12 DIAGNOSIS — G609 Hereditary and idiopathic neuropathy, unspecified: Secondary | ICD-10-CM | POA: Diagnosis present

## 2017-06-12 DIAGNOSIS — J9600 Acute respiratory failure, unspecified whether with hypoxia or hypercapnia: Secondary | ICD-10-CM | POA: Diagnosis present

## 2017-06-12 DIAGNOSIS — K7031 Alcoholic cirrhosis of liver with ascites: Secondary | ICD-10-CM | POA: Diagnosis not present

## 2017-06-12 DIAGNOSIS — S2241XD Multiple fractures of ribs, right side, subsequent encounter for fracture with routine healing: Secondary | ICD-10-CM

## 2017-06-12 DIAGNOSIS — F1020 Alcohol dependence, uncomplicated: Secondary | ICD-10-CM | POA: Diagnosis not present

## 2017-06-12 DIAGNOSIS — D61818 Other pancytopenia: Secondary | ICD-10-CM | POA: Diagnosis present

## 2017-06-12 DIAGNOSIS — F1010 Alcohol abuse, uncomplicated: Secondary | ICD-10-CM | POA: Diagnosis present

## 2017-06-12 DIAGNOSIS — J9601 Acute respiratory failure with hypoxia: Secondary | ICD-10-CM | POA: Diagnosis present

## 2017-06-12 DIAGNOSIS — L03311 Cellulitis of abdominal wall: Secondary | ICD-10-CM | POA: Diagnosis present

## 2017-06-12 DIAGNOSIS — J449 Chronic obstructive pulmonary disease, unspecified: Secondary | ICD-10-CM | POA: Diagnosis present

## 2017-06-12 DIAGNOSIS — Z8719 Personal history of other diseases of the digestive system: Secondary | ICD-10-CM

## 2017-06-12 DIAGNOSIS — E876 Hypokalemia: Secondary | ICD-10-CM | POA: Diagnosis present

## 2017-06-12 DIAGNOSIS — K219 Gastro-esophageal reflux disease without esophagitis: Secondary | ICD-10-CM | POA: Diagnosis present

## 2017-06-12 DIAGNOSIS — R0902 Hypoxemia: Secondary | ICD-10-CM

## 2017-06-12 DIAGNOSIS — J441 Chronic obstructive pulmonary disease with (acute) exacerbation: Secondary | ICD-10-CM | POA: Diagnosis not present

## 2017-06-12 DIAGNOSIS — F17210 Nicotine dependence, cigarettes, uncomplicated: Secondary | ICD-10-CM | POA: Diagnosis present

## 2017-06-12 DIAGNOSIS — G35 Multiple sclerosis: Secondary | ICD-10-CM | POA: Diagnosis present

## 2017-06-12 DIAGNOSIS — R188 Other ascites: Secondary | ICD-10-CM | POA: Diagnosis present

## 2017-06-12 DIAGNOSIS — F909 Attention-deficit hyperactivity disorder, unspecified type: Secondary | ICD-10-CM | POA: Diagnosis present

## 2017-06-12 DIAGNOSIS — Z79899 Other long term (current) drug therapy: Secondary | ICD-10-CM

## 2017-06-12 DIAGNOSIS — N390 Urinary tract infection, site not specified: Secondary | ICD-10-CM | POA: Diagnosis present

## 2017-06-12 DIAGNOSIS — Z7951 Long term (current) use of inhaled steroids: Secondary | ICD-10-CM

## 2017-06-12 LAB — URINALYSIS, ROUTINE W REFLEX MICROSCOPIC
Bilirubin Urine: NEGATIVE
GLUCOSE, UA: NEGATIVE mg/dL
Ketones, ur: NEGATIVE mg/dL
Leukocytes, UA: NEGATIVE
Nitrite: POSITIVE — AB
PROTEIN: NEGATIVE mg/dL
SPECIFIC GRAVITY, URINE: 1.004 — AB (ref 1.005–1.030)
pH: 6 (ref 5.0–8.0)

## 2017-06-12 LAB — COMPREHENSIVE METABOLIC PANEL
ALT: 22 U/L (ref 14–54)
ANION GAP: 13 (ref 5–15)
AST: 39 U/L (ref 15–41)
Albumin: 3.3 g/dL — ABNORMAL LOW (ref 3.5–5.0)
Alkaline Phosphatase: 138 U/L — ABNORMAL HIGH (ref 38–126)
BUN: 5 mg/dL — ABNORMAL LOW (ref 6–20)
CHLORIDE: 100 mmol/L — AB (ref 101–111)
CO2: 26 mmol/L (ref 22–32)
CREATININE: 0.31 mg/dL — AB (ref 0.44–1.00)
Calcium: 8 mg/dL — ABNORMAL LOW (ref 8.9–10.3)
Glucose, Bld: 83 mg/dL (ref 65–99)
POTASSIUM: 3.7 mmol/L (ref 3.5–5.1)
SODIUM: 139 mmol/L (ref 135–145)
Total Bilirubin: 1 mg/dL (ref 0.3–1.2)
Total Protein: 5.8 g/dL — ABNORMAL LOW (ref 6.5–8.1)

## 2017-06-12 LAB — CBC WITH DIFFERENTIAL/PLATELET
Basophils Absolute: 0 10*3/uL (ref 0.0–0.1)
Basophils Relative: 1 %
EOS ABS: 0.2 10*3/uL (ref 0.0–0.7)
EOS PCT: 7 %
HCT: 37 % (ref 36.0–46.0)
Hemoglobin: 11.4 g/dL — ABNORMAL LOW (ref 12.0–15.0)
LYMPHS ABS: 0.8 10*3/uL (ref 0.7–4.0)
LYMPHS PCT: 26 %
MCH: 26.9 pg (ref 26.0–34.0)
MCHC: 30.8 g/dL (ref 30.0–36.0)
MCV: 87.3 fL (ref 78.0–100.0)
Monocytes Absolute: 0.3 10*3/uL (ref 0.1–1.0)
Monocytes Relative: 9 %
Neutro Abs: 1.8 10*3/uL (ref 1.7–7.7)
Neutrophils Relative %: 57 %
PLATELETS: 107 10*3/uL — AB (ref 150–400)
RBC: 4.24 MIL/uL (ref 3.87–5.11)
RDW: 18.3 % — ABNORMAL HIGH (ref 11.5–15.5)
WBC: 3.2 10*3/uL — AB (ref 4.0–10.5)

## 2017-06-12 LAB — BRAIN NATRIURETIC PEPTIDE: B Natriuretic Peptide: 115 pg/mL — ABNORMAL HIGH (ref 0.0–100.0)

## 2017-06-12 LAB — I-STAT CG4 LACTIC ACID, ED
LACTIC ACID, VENOUS: 2.04 mmol/L — AB (ref 0.5–1.9)
Lactic Acid, Venous: 1.94 mmol/L — ABNORMAL HIGH (ref 0.5–1.9)

## 2017-06-12 LAB — I-STAT TROPONIN, ED: TROPONIN I, POC: 0 ng/mL (ref 0.00–0.08)

## 2017-06-12 LAB — LIPASE, BLOOD: LIPASE: 25 U/L (ref 11–51)

## 2017-06-12 LAB — ETHANOL: Alcohol, Ethyl (B): 245 mg/dL — ABNORMAL HIGH (ref <10)

## 2017-06-12 MED ORDER — IPRATROPIUM-ALBUTEROL 0.5-2.5 (3) MG/3ML IN SOLN
3.0000 mL | Freq: Once | RESPIRATORY_TRACT | Status: AC
  Administered 2017-06-12: 3 mL via RESPIRATORY_TRACT
  Filled 2017-06-12: qty 3

## 2017-06-12 MED ORDER — GABAPENTIN 100 MG PO CAPS
100.0000 mg | ORAL_CAPSULE | Freq: Three times a day (TID) | ORAL | Status: DC
  Administered 2017-06-12 – 2017-06-13 (×4): 100 mg via ORAL
  Filled 2017-06-12 (×4): qty 1

## 2017-06-12 MED ORDER — SODIUM CHLORIDE 0.9 % IV SOLN
1.0000 g | Freq: Once | INTRAVENOUS | Status: AC
  Administered 2017-06-12: 1 g via INTRAVENOUS
  Filled 2017-06-12: qty 10

## 2017-06-12 MED ORDER — PANTOPRAZOLE SODIUM 40 MG PO TBEC
40.0000 mg | DELAYED_RELEASE_TABLET | Freq: Every day | ORAL | Status: DC
  Administered 2017-06-13 – 2017-06-14 (×2): 40 mg via ORAL
  Filled 2017-06-12 (×2): qty 1

## 2017-06-12 MED ORDER — RISPERIDONE 0.25 MG PO TABS
0.5000 mg | ORAL_TABLET | Freq: Two times a day (BID) | ORAL | Status: DC
  Administered 2017-06-12 – 2017-06-14 (×3): 0.5 mg via ORAL
  Filled 2017-06-12 (×3): qty 2

## 2017-06-12 MED ORDER — NICOTINE 21 MG/24HR TD PT24
21.0000 mg | MEDICATED_PATCH | Freq: Every day | TRANSDERMAL | Status: DC
  Administered 2017-06-12 – 2017-06-14 (×3): 21 mg via TRANSDERMAL
  Filled 2017-06-12 (×3): qty 1

## 2017-06-12 MED ORDER — ONDANSETRON HCL 4 MG/2ML IJ SOLN: 4.0000 mg | Freq: Once | INTRAMUSCULAR | Status: DC

## 2017-06-12 MED ORDER — MODAFINIL 200 MG PO TABS
200.0000 mg | ORAL_TABLET | ORAL | Status: DC
  Administered 2017-06-13 – 2017-06-14 (×3): 200 mg via ORAL
  Filled 2017-06-12 (×3): qty 1

## 2017-06-12 MED ORDER — AMOXICILLIN-POT CLAVULANATE 875-125 MG PO TABS: 1.0000 | ORAL_TABLET | Freq: Two times a day (BID) | ORAL | Status: DC

## 2017-06-12 MED ORDER — TOPIRAMATE 100 MG PO TABS
100.0000 mg | ORAL_TABLET | Freq: Every day | ORAL | Status: DC
  Administered 2017-06-13 – 2017-06-14 (×2): 100 mg via ORAL
  Filled 2017-06-12 (×2): qty 1

## 2017-06-12 MED ORDER — LISDEXAMFETAMINE DIMESYLATE 50 MG PO CAPS
50.0000 mg | ORAL_CAPSULE | Freq: Every day | ORAL | Status: DC
  Administered 2017-06-13 – 2017-06-14 (×2): 50 mg via ORAL
  Filled 2017-06-12 (×2): qty 1

## 2017-06-12 MED ORDER — METHYLPREDNISOLONE SODIUM SUCC 125 MG IJ SOLR
125.0000 mg | Freq: Once | INTRAMUSCULAR | Status: AC
  Administered 2017-06-12: 125 mg via INTRAVENOUS
  Filled 2017-06-12: qty 2

## 2017-06-12 MED ORDER — FOLIC ACID 1 MG PO TABS: 1.0000 mg | ORAL_TABLET | Freq: Every day | ORAL | Status: DC

## 2017-06-12 MED ORDER — HYDROXYZINE HCL 25 MG PO TABS
25.0000 mg | ORAL_TABLET | Freq: Three times a day (TID) | ORAL | Status: DC | PRN
  Administered 2017-06-12 – 2017-06-14 (×3): 25 mg via ORAL
  Filled 2017-06-12 (×4): qty 1

## 2017-06-12 MED ORDER — LORAZEPAM 2 MG/ML IJ SOLN
1.0000 mg | Freq: Once | INTRAMUSCULAR | Status: AC
  Administered 2017-06-12: 1 mg via INTRAVENOUS
  Filled 2017-06-12: qty 1

## 2017-06-12 MED ORDER — LORAZEPAM 1 MG PO TABS
1.0000 mg | ORAL_TABLET | Freq: Four times a day (QID) | ORAL | Status: DC | PRN
  Administered 2017-06-12 – 2017-06-14 (×5): 1 mg via ORAL
  Filled 2017-06-12 (×5): qty 1

## 2017-06-12 MED ORDER — LISDEXAMFETAMINE DIMESYLATE 20 MG PO CAPS
20.0000 mg | ORAL_CAPSULE | Freq: Every day | ORAL | Status: DC
  Administered 2017-06-13 – 2017-06-14 (×2): 20 mg via ORAL
  Filled 2017-06-12 (×2): qty 1

## 2017-06-12 MED ORDER — ENOXAPARIN SODIUM 40 MG/0.4ML ~~LOC~~ SOLN
40.0000 mg | SUBCUTANEOUS | Status: DC
  Administered 2017-06-12 – 2017-06-13 (×2): 40 mg via SUBCUTANEOUS
  Filled 2017-06-12 (×2): qty 0.4

## 2017-06-12 MED ORDER — FOLIC ACID 1 MG PO TABS
1.0000 mg | ORAL_TABLET | Freq: Every day | ORAL | Status: DC
  Administered 2017-06-12 – 2017-06-14 (×3): 1 mg via ORAL
  Filled 2017-06-12 (×3): qty 1

## 2017-06-12 MED ORDER — LORAZEPAM 2 MG/ML IJ SOLN
1.0000 mg | Freq: Four times a day (QID) | INTRAMUSCULAR | Status: DC | PRN
  Administered 2017-06-13 – 2017-06-14 (×2): 1 mg via INTRAVENOUS
  Filled 2017-06-12 (×2): qty 1

## 2017-06-12 MED ORDER — LISDEXAMFETAMINE DIMESYLATE 70 MG PO CAPS: 70.0000 mg | ORAL_CAPSULE | Freq: Every day | ORAL | Status: DC

## 2017-06-12 MED ORDER — GUAIFENESIN-DM 100-10 MG/5ML PO SYRP
5.0000 mL | ORAL_SOLUTION | ORAL | Status: DC | PRN
Start: 2017-06-12 — End: 2017-06-14
  Administered 2017-06-12: 5 mL via ORAL
  Filled 2017-06-12: qty 10

## 2017-06-12 MED ORDER — HYDROXYZINE PAMOATE 25 MG PO CAPS: 25.0000 mg | ORAL_CAPSULE | Freq: Three times a day (TID) | ORAL | Status: DC | PRN

## 2017-06-12 MED ORDER — FLUOXETINE HCL 20 MG PO CAPS
20.0000 mg | ORAL_CAPSULE | Freq: Every day | ORAL | Status: DC
  Administered 2017-06-13 – 2017-06-14 (×2): 20 mg via ORAL
  Filled 2017-06-12 (×2): qty 1

## 2017-06-12 MED ORDER — ADULT MULTIVITAMIN W/MINERALS CH
1.0000 | ORAL_TABLET | Freq: Every day | ORAL | Status: DC
  Administered 2017-06-12 – 2017-06-14 (×3): 1 via ORAL
  Filled 2017-06-12 (×3): qty 1

## 2017-06-12 MED ORDER — IPRATROPIUM-ALBUTEROL 0.5-2.5 (3) MG/3ML IN SOLN
3.0000 mL | Freq: Four times a day (QID) | RESPIRATORY_TRACT | Status: DC
  Administered 2017-06-12: 3 mL via RESPIRATORY_TRACT
  Filled 2017-06-12: qty 3

## 2017-06-12 MED ORDER — FUROSEMIDE 40 MG PO TABS
40.0000 mg | ORAL_TABLET | Freq: Every day | ORAL | Status: DC
  Administered 2017-06-13: 40 mg via ORAL
  Filled 2017-06-12: qty 1

## 2017-06-12 MED ORDER — SPIRONOLACTONE 25 MG PO TABS
100.0000 mg | ORAL_TABLET | Freq: Every day | ORAL | Status: DC
  Administered 2017-06-12 – 2017-06-14 (×3): 100 mg via ORAL
  Filled 2017-06-12 (×3): qty 4

## 2017-06-12 MED ORDER — ONDANSETRON HCL 4 MG/2ML IJ SOLN
4.0000 mg | Freq: Three times a day (TID) | INTRAMUSCULAR | Status: DC | PRN
  Administered 2017-06-12 – 2017-06-13 (×2): 4 mg via INTRAVENOUS
  Filled 2017-06-12 (×2): qty 2

## 2017-06-12 MED ORDER — BENZONATATE 100 MG PO CAPS
200.0000 mg | ORAL_CAPSULE | Freq: Three times a day (TID) | ORAL | Status: DC | PRN
  Administered 2017-06-12: 200 mg via ORAL
  Filled 2017-06-12: qty 2

## 2017-06-12 MED ORDER — SODIUM CHLORIDE 0.9 % IV SOLN
Freq: Once | INTRAVENOUS | Status: AC
  Administered 2017-06-12: 11:00:00 via INTRAVENOUS

## 2017-06-12 MED ORDER — IOPAMIDOL (ISOVUE-370) INJECTION 76%
INTRAVENOUS | Status: AC
  Filled 2017-06-12: qty 100

## 2017-06-12 MED ORDER — VITAMIN B-1 100 MG PO TABS
100.0000 mg | ORAL_TABLET | Freq: Every day | ORAL | Status: DC
  Administered 2017-06-12 – 2017-06-14 (×3): 100 mg via ORAL
  Filled 2017-06-12 (×3): qty 1

## 2017-06-12 MED ORDER — SODIUM CHLORIDE 0.9 % IV SOLN: 1.0000 g | INTRAVENOUS | Status: DC

## 2017-06-12 MED ORDER — ONDANSETRON HCL 4 MG/2ML IJ SOLN
4.0000 mg | Freq: Once | INTRAMUSCULAR | Status: AC
  Administered 2017-06-12: 4 mg via INTRAVENOUS
  Filled 2017-06-12: qty 2

## 2017-06-12 MED ORDER — PHENOL 1.4 % MT LIQD
1.0000 | OROMUCOSAL | Status: DC | PRN
  Administered 2017-06-12: 1 via OROMUCOSAL
  Filled 2017-06-12: qty 177

## 2017-06-12 MED ORDER — METHYLPREDNISOLONE SODIUM SUCC 125 MG IJ SOLR
60.0000 mg | Freq: Four times a day (QID) | INTRAMUSCULAR | Status: DC
  Administered 2017-06-12 – 2017-06-13 (×3): 60 mg via INTRAVENOUS
  Filled 2017-06-12 (×4): qty 2

## 2017-06-12 MED ORDER — MOMETASONE FURO-FORMOTEROL FUM 100-5 MCG/ACT IN AERO
2.0000 | INHALATION_SPRAY | Freq: Two times a day (BID) | RESPIRATORY_TRACT | Status: DC
  Administered 2017-06-12 – 2017-06-14 (×2): 2 via RESPIRATORY_TRACT
  Filled 2017-06-12: qty 8.8

## 2017-06-12 MED ORDER — SODIUM CHLORIDE 0.9 % IV SOLN
2.0000 g | INTRAVENOUS | Status: DC
  Administered 2017-06-13 – 2017-06-14 (×2): 2 g via INTRAVENOUS
  Filled 2017-06-12 (×2): qty 2

## 2017-06-12 MED ORDER — IPRATROPIUM-ALBUTEROL 0.5-2.5 (3) MG/3ML IN SOLN
3.0000 mL | Freq: Four times a day (QID) | RESPIRATORY_TRACT | Status: DC
  Administered 2017-06-13 (×2): 3 mL via RESPIRATORY_TRACT
  Filled 2017-06-12 (×2): qty 3

## 2017-06-12 MED ORDER — FLUTICASONE PROPIONATE 50 MCG/ACT NA SUSP
1.0000 | NASAL | Status: DC | PRN
  Filled 2017-06-12: qty 16

## 2017-06-12 MED ORDER — THIAMINE HCL 100 MG/ML IJ SOLN: 100.0000 mg | Freq: Every day | INTRAMUSCULAR | Status: DC

## 2017-06-12 MED ORDER — IOPAMIDOL (ISOVUE-370) INJECTION 76%
100.0000 mL | Freq: Once | INTRAVENOUS | Status: AC | PRN
  Administered 2017-06-12: 100 mL via INTRAVENOUS

## 2017-06-12 MED ORDER — SUMATRIPTAN SUCCINATE 50 MG PO TABS
50.0000 mg | ORAL_TABLET | Freq: Once | ORAL | Status: AC
  Administered 2017-06-12: 50 mg via ORAL
  Filled 2017-06-12: qty 1

## 2017-06-12 NOTE — ED Notes (Signed)
I have just seen pt. Soundly sleeping. She arouses easily. Her rm. Air SPO2 is 83-84%. This immediately goes to 96% upon the re-application of her O2.

## 2017-06-12 NOTE — ED Provider Notes (Signed)
[facility]-EMERGENCY DEPT Provider Note   CSN: [id number] Arrival date & time: 06/12/17  0910     History   Chief Complaint Chief Complaint  Patient presents with  . Abdominal Pain    HPI Anne Black is a 57 y.o. female.  HPI   57 yo F with h/o COPD, CIrrhosis, Chronic alcoholism, bipolar d/o, MS here with multiple complaints.   Primary complaint is cough, SOB, and abd pain. Pt states that since her rib fx several weeks ago she has felt SOB. She's had worsening cough, sputum production x 2-3 days. She's also developed nausea and upper abd pain. She's begun vomiting today and has not been able to eat or drink. Pain is aching, gnawing, epigastric, worse with eating. No allevaiting factors. She believes it's 2/2 recent ETOH binge and also admits that she ran out of alcohol and may be withdrawing. Denies tremors. She is interested in detox. No seizures. No hallucinations. No alleviating factors. No chest pain.  Past Medical History:  Diagnosis Date  . ADHD (attention deficit hyperactivity disorder)   . Anxiety   . Arthritis   . Ascites   . Bipolar disorder (HCC)   . Cirrhosis (HCC)   . COPD (chronic obstructive pulmonary disease) (HCC)   . Hypokalemia   . Migraine   . Multiple sclerosis (HCC)   . Narcolepsy   . Osteoporosis     Patient Active Problem List   Diagnosis Date Noted  . COPD exacerbation (HCC) 06/12/2017  . Chest pain with moderate risk for cardiac etiology 05/30/2017  . Abnormal LFTs 05/30/2017  . Tachycardia 05/30/2017  . COPD (chronic obstructive pulmonary disease) (HCC) 04/19/2017  . Right rib fracture 04/15/2017  . Abdominal wall cellulitis 12/30/2016  . S/P exploratory laparotomy 12/30/2016  . Distal radius fracture, left 09/09/2016  . Thrombocytopenia (HCC) 09/09/2016  . Chest x-ray abnormality   . Umbilical hernia without obstruction and without gangrene 05/12/2016  . Itching 02/24/2016  . Ventral hernia without obstruction  or gangrene 02/24/2016  . Palliative care encounter   . Ascites   . Tachypnea   . Atrial flutter with rapid ventricular response (HCC)   . Hypomagnesemia   . Hypoxia   . Dehydration 01/22/2016  . Low back ache 12/30/2015  . Non-intractable vomiting with nausea   . Alcohol abuse with alcohol-induced mood disorder (HCC) 12/15/2015  . Acute alcoholism (HCC)   . Abdominal distension 11/04/2015  . Alcohol abuse   . Dyspnea   . Acute respiratory failure (HCC) 10/15/2015  . Ascites due to alcoholic cirrhosis (HCC) 10/15/2015  . Osteoporosis   . Bipolar I disorder (HCC)   . Multiple sclerosis (HCC)   . Alcoholic cirrhosis of liver with ascites (HCC) 08/22/2015  . Bipolar disorder, current episode mixed, moderate (HCC) 08/01/2015  . Alcohol use disorder, severe, dependence (HCC) 07/31/2015  . Macrocytic anemia- due to alcohol abuse with normal B12 & folate levels 05/31/2015  . Severe protein-calorie malnutrition (HCC) 05/31/2015  . Hypokalemia 05/26/2015  . Encephalopathy, hepatic (HCC) 05/26/2015  . Alcohol withdrawal (HCC) 05/18/2015  . Abdominal pain 05/18/2015  . Anxiety 05/18/2015  . UTI (lower urinary tract infection) 05/18/2015  . Stimulant abuse (HCC) 12/27/2014  . Nicotine abuse 12/27/2014  . Hyperprolactinemia (HCC) 11/15/2014  . Dyslipidemia   . Tobacco abuse 01/23/2014  . Noncompliance with therapeutic plan 04/04/2013  . Unspecified hereditary and idiopathic peripheral neuropathy 05/01/2012  . GERD (gastroesophageal reflux disease) 05/01/2012  . Osteoarthrosis, unspecified whether generalized or localized, involving  lower leg 05/01/2012  . Hyponatremia 07/01/2011    Past Surgical History:  Procedure Laterality Date  . ABDOMINAL WALL DEFECT REPAIR N/A 12/30/2016   Procedure: EXPLORATORY LAPAROTOMY WITH REPAIR ABDOMINAL WALL VENTRAL HERNIA;  Surgeon: [name], MD;  Location: [facility] OR;  Service: General;  Laterality: N/A;  . APPLICATION OF WOUND VAC N/A  12/30/2016   Procedure: APPLICATION OF WOUND VAC;  Surgeon: [name], MD;  Location: [facility] OR;  Service: General;  Laterality: N/A;  . CESAREAN SECTION  [phone]  . FRACTURE SURGERY    . HERNIA REPAIR    . MYRINGOTOMY WITH TUBE PLACEMENT Bilateral   . ORIF WRIST FRACTURE Left 09/09/2016   Procedure: OPEN REDUCTION INTERNAL FIXATION (ORIF) LEFT WRIST FRACTURE, LEFT CARPAL TUNNEL RELEASE;  Surgeon: Dominica Severin, MD;  Location: [facility] ORS;  Service: Orthopedics;  Laterality: Left;  . TONSILLECTOMY       OB History   None      Home Medications    Prior to Admission medications   Medication Sig Start Date End Date Taking? Authorizing Provider  acetaminophen (TYLENOL) 500 MG tablet Take 1 tablet (500 mg total) by mouth every 6 (six) hours as needed for mild pain or headache. 03/11/17  Yes Arrien, York Ram, MD  albuterol (PROVENTIL HFA;VENTOLIN HFA) 108 (90 Base) MCG/ACT inhaler Inhale 2 puffs into the lungs every 6 (six) hours as needed for wheezing or shortness of breath.   Yes [provider]  FLUoxetine (PROZAC) 20 MG capsule Take 20 mg by mouth daily.  03/30/16  Yes [provider]  fluticasone (FLONASE) 50 MCG/ACT nasal spray Place 1 spray into both nostrils as needed for allergies or rhinitis.   Yes [provider]  Fluticasone-Salmeterol (ADVAIR) 100-50 MCG/DOSE AEPB Inhale 1-2 puffs 2 (two) times daily into the lungs.  11/05/14  Yes [provider]  folic acid (FOLVITE) 1 MG tablet Take 1 tablet (1 mg total) by mouth daily. 04/21/17  Yes Sheikh, Omair Latif, DO  furosemide (LASIX) 40 MG tablet Take 1 tablet (40 mg total) by mouth daily. 06/17/16  Yes Midge Minium, MD  gabapentin (NEURONTIN) 100 MG capsule Take 100 mg by mouth 3 (three) times daily. 06/02/17  Yes [provider]  hydrocortisone cream 1 % Apply topically 3 (three) times daily. 04/20/17  Yes Sheikh, Omair Latif, DO  hydrOXYzine (VISTARIL) 25 MG capsule Take 25 mg by mouth  3 (three) times daily as needed for anxiety or itching.  05/08/17  Yes [provider]  ipratropium-albuterol (DUONEB) 0.5-2.5 (3) MG/3ML SOLN Take 3 mLs by nebulization every 6 (six) hours as needed. 03/11/17 06/12/17 Yes Arrien, York Ram, MD  lidocaine (LIDODERM) 5 % Place 1 patch onto the skin daily. Remove & Discard patch within 12 hours or as directed by MD 04/21/17  Yes Marland Mcalpine, Omair Latif, DO  lisdexamfetamine (VYVANSE) 70 MG capsule Take 1 capsule (70 mg total) by mouth daily. 05/06/17  Yes Plunkett, Alphonzo Lemmings, MD  Menthol-Methyl Salicylate (MUSCLE RUB) 10-15 % CREA Apply 1 application topically as needed for muscle pain. 04/20/17  Yes Sheikh, Omair Latif, DO  modafinil (PROVIGIL) 200 MG tablet Take 200 mg by mouth 2 (two) times daily.  08/04/16  Yes [provider]  nicotine (NICODERM CQ - DOSED IN MG/24 HOURS) 21 mg/24hr patch Place 1 patch (21 mg total) onto the skin daily. 04/21/17  Yes Sheikh, Omair Latif, DO  ondansetron (ZOFRAN ODT) 4 MG disintegrating tablet Take 1 tablet (4 mg total) by mouth every 8 (eight)  hours as needed for nausea or vomiting. 03/25/17  Yes Renne Crigler, PA-C  pantoprazole (PROTONIX) 40 MG tablet Take 1 tablet (40 mg total) by mouth daily. 12/30/15  Yes Haney, Alyssa A, MD  potassium chloride SA (K-DUR,KLOR-CON) 20 MEQ tablet Take 20 mEq by mouth daily.   Yes [provider]  risperiDONE (RISPERDAL) 0.5 MG tablet Take 1 tablet (0.5 mg total) by mouth 3 (three) times daily as needed (agitation). Patient taking differently: Take 0.5 mg by mouth 2 (two) times daily.  02/06/16  Yes Renne Musca, MD  rizatriptan (MAXALT) 10 MG tablet Take 1 tablet (10 mg total) by mouth as needed for migraine. May repeat in 2 hours if needed 03/25/17  Yes Renne Crigler, PA-C  spironolactone (ALDACTONE) 100 MG tablet Take 1 tablet (100 mg total) by mouth daily. 11/23/16  Yes Derwood Kaplan, MD  topiramate (TOPAMAX) 100 MG tablet Take 1 tablet (100 mg total) by  mouth daily. 01/07/16  Yes [name], MD  oxyCODONE (OXY IR/ROXICODONE) 5 MG immediate release tablet Take 1 tablet (5 mg total) by mouth every 6 (six) hours as needed for severe pain. Patient not taking: Reported on 06/12/2017 04/20/17   Marguerita Merles Latif, DO  predniSONE (DELTASONE) 20 MG tablet Take 2 tablets (40 mg total) by mouth daily with breakfast. Patient not taking: Reported on 06/12/2017 04/21/17   Marguerita Merles Latif, DO  thiamine 100 MG tablet Take 1 tablet (100 mg total) by mouth daily. Patient not taking: Reported on 06/12/2017 04/21/17   Merlene Laughter, DO    Family History Family History  Problem Relation Age of Onset  . Arrhythmia Mother   . Heart disease Father   . Hypertension Father     Social History Social History   Tobacco Use  . Smoking status: Current Every Day Smoker    Packs/day: 1.00    Years: 35.00    Pack years: 35.00    Types: Cigarettes  . Smokeless tobacco: Never Used  Substance Use Topics  . Alcohol use: Yes    Alcohol/week: 16.8 oz    Types: 28 Glasses of wine per week    Comment: A bottle of wine or more per day.  . Drug use: No     Allergies   Patient has no known allergies.   Review of Systems Review of Systems  Constitutional: Positive for fatigue.  Respiratory: Positive for cough, shortness of breath and wheezing.   Gastrointestinal: Positive for abdominal pain and nausea.  Neurological: Positive for weakness.  All other systems reviewed and are negative.    Physical Exam Updated Vital Signs BP (!) 122/55   Pulse 85   Temp 98 F (36.7 C) (Oral)   Resp (!) 27   SpO2 96%   Physical Exam  Constitutional: She is oriented to person, place, and time. She appears well-developed and well-nourished. She has a sickly appearance. No distress.  HENT:  Head: Normocephalic and atraumatic.  Eyes: Conjunctivae are normal.  Neck: Neck supple.  Cardiovascular: Normal rate, regular rhythm and normal heart sounds. Exam  reveals no friction rub.  No murmur heard. Pulmonary/Chest: Effort normal. Tachypnea noted. No respiratory distress. She has decreased breath sounds. She has wheezes. She has rhonchi in the right lower field and the left lower field. She has no rales.  Abdominal: She exhibits distension. There is tenderness in the right upper quadrant, epigastric area, periumbilical area and left upper quadrant.  Chronic midline abd wound, no surrounding erythema. No fluctuance or  drainage.  Musculoskeletal: She exhibits no edema.  Neurological: She is alert and oriented to person, place, and time. She exhibits normal muscle tone.  Skin: Skin is warm. Capillary refill takes less than 2 seconds.  Psychiatric: She has a normal mood and affect.  Nursing note and vitals reviewed.    ED Treatments / Results  Labs (all labs ordered are listed, but only abnormal results are displayed) Labs Reviewed  CBC WITH DIFFERENTIAL/PLATELET - Abnormal; Notable for the following components:      Result Value   WBC 3.2 (*)    Hemoglobin 11.4 (*)    RDW 18.3 (*)    Platelets 107 (*)    All other components within normal limits  COMPREHENSIVE METABOLIC PANEL - Abnormal; Notable for the following components:   Chloride 100 (*)    BUN 5 (*)    Creatinine, Ser 0.31 (*)    Calcium 8.0 (*)    Total Protein 5.8 (*)    Albumin 3.3 (*)    Alkaline Phosphatase 138 (*)    All other components within normal limits  URINALYSIS, ROUTINE W REFLEX MICROSCOPIC - Abnormal; Notable for the following components:   Specific Gravity, Urine 1.004 (*)    Hgb urine dipstick SMALL (*)    Nitrite POSITIVE (*)    Bacteria, UA MANY (*)    All other components within normal limits  BRAIN NATRIURETIC PEPTIDE - Abnormal; Notable for the following components:   B Natriuretic Peptide 115.0 (*)    All other components within normal limits  ETHANOL - Abnormal; Notable for the following components:   Alcohol, Ethyl (B) 245 (*)    All other  components within normal limits  I-STAT CG4 LACTIC ACID, ED - Abnormal; Notable for the following components:   Lactic Acid, Venous 2.04 (*)    All other components within normal limits  I-STAT CG4 LACTIC ACID, ED - Abnormal; Notable for the following components:   Lactic Acid, Venous 1.94 (*)    All other components within normal limits  URINE CULTURE  LIPASE, BLOOD  I-STAT TROPONIN, ED    EKG EKG Interpretation  Date/Time:  Sunday Jun 12 2017 09:44:52 EDT Ventricular Rate:  81 PR Interval:    QRS Duration: 95 QT Interval:  410 QTC Calculation: 476 R Axis:   86 Text Interpretation:  Sinus rhythm No significant change since last tracing Confirmed by Shaune Pollack [phone]) on 06/12/2017 10:06:21 AM Also confirmed by Shaune Pollack [phone]), editor Sheppard Evens ([id number])  on 06/12/2017 10:15:38 AM   Radiology Dg Chest 2 View  Result Date: 06/12/2017 CLINICAL DATA:  Cough and dyspnea EXAM: CHEST - 2 VIEW COMPARISON:  05/30/2017 chest radiograph. FINDINGS: Stable cardiomediastinal silhouette with mild cardiomegaly. No pneumothorax. No pleural effusion. Lungs appear clear, with no acute consolidative airspace disease and no pulmonary edema. IMPRESSION: Stable mild cardiomegaly without pulmonary edema. No active pulmonary disease. Electronically Signed   By: Delbert Phenix M.D.   On: 06/12/2017 10:39   Ct Angio Chest Pe W And/or Wo Contrast  Result Date: 06/12/2017 CLINICAL DATA:  Chest and abdominal pain with nausea. Atrial fibrillation. Abdominal surgical wound. Clinical suspicion of pulmonary embolism. EXAM: CT ANGIOGRAPHY CHEST CT ABDOMEN AND PELVIS WITH CONTRAST TECHNIQUE: Multidetector CT imaging of the chest was performed using the standard protocol during bolus administration of intravenous contrast. Multiplanar CT image reconstructions and MIPs were obtained to evaluate the vascular anatomy. Multidetector CT imaging of the abdomen and pelvis was performed using the standard protocol  during  bolus administration of intravenous contrast. CONTRAST:  ISOVUE-370 IOPAMIDOL (ISOVUE-370) INJECTION 76% COMPARISON:  Chest CT 01/18/2017.  Abdominopelvic CT 05/06/2017. FINDINGS: CTA CHEST FINDINGS Cardiovascular: The pulmonary arteries are well opacified with contrast to the level of the subsegmental branches. There is no evidence of acute pulmonary embolism. Minimal aortic atherosclerosis without acute systemic arterial abnormalities. The heart size is normal. There is no pericardial effusion. Mediastinum/Nodes: There are no enlarged mediastinal, hilar or axillary lymph nodes. The thyroid gland, trachea and esophagus demonstrate no significant findings. Lungs/Pleura: There is no pleural effusion. The lungs are clear. Musculoskeletal/Chest wall: There is no chest wall mass or suspicious osseous finding. Bilateral subpectoral breast implants. Several old rib fractures on the right, incompletely healed. Review of the MIP images confirms the above findings. CT ABDOMEN and PELVIS FINDINGS Hepatobiliary: Changes of hepatic cirrhosis are again noted. There is progressive capsular retraction, volume loss and ill-defined increased density anteriorly in the right hepatic lobe. Ill-defined hyperdense 18 mm lesion in the left lobe on image 19/2 is stable. No evidence of gallstones, gallbladder wall thickening or biliary dilatation. Pancreas: Unremarkable. No pancreatic ductal dilatation or surrounding inflammatory changes. Spleen: Spleen size is stable at the upper limits of normal. No focal abnormality. Adrenals/Urinary Tract: Both adrenal glands appear normal. Both kidneys appear normal without evidence of urinary tract calculus, renal mass hydronephrosis. The bladder appears normal. Stomach/Bowel: No enteric contrast was administered. The stomach and right colon are incompletely distended. Allowing for this, no apparent bowel wall thickening or surrounding inflammation. The appendix appears normal.  Vascular/Lymphatic: Several prominent lymph nodes in the porta hepatis and gastrohepatic ligament are similar to the previous study, likely reactive and related to the patient's cirrhosis. There is no retroperitoneal lymphadenopathy. There is mild aortic and branch vessel atherosclerosis. Small gastric and distal esophageal varices. Recanalized left paraumbilical vein. Stable mild dilatation of the left ovarian vein. Reproductive: The uterus and ovaries appear normal. No evidence of adnexal mass. Other: Stable postsurgical changes in the periumbilical anterior abdominal wall with skin thickening and increased density in the underlying subcutaneous fat. There is no focal fluid collection or hernia. There is a small amount of ascites around the liver and in the pelvis, increased from the prior study. Musculoskeletal: No acute osseous findings. Chronic sclerosis of the left femoral head without collapse, consistent with mild chronic avascular necrosis. Right iliopsoas bursal fluid collection appears stable. IMPRESSION: 1. No evidence of acute pulmonary embolism or other acute chest findings. 2. Healing right-sided rib fractures. 3. Progressive changes of hepatic cirrhosis with probable confluent hepatic fibrosis. Hypervascular lesion in the left lobe is stable. 4. Stable findings consistent with mild portal hypertension with borderline splenomegaly, varices and increasing ascites. 5.  Aortic Atherosclerosis (ICD10-I70.0). Electronically Signed   By: Carey Bullocks M.D.   On: 06/12/2017 12:26   Ct Abdomen Pelvis W Contrast  Result Date: 06/12/2017 CLINICAL DATA:  Chest and abdominal pain with nausea. Atrial fibrillation. Abdominal surgical wound. Clinical suspicion of pulmonary embolism. EXAM: CT ANGIOGRAPHY CHEST CT ABDOMEN AND PELVIS WITH CONTRAST TECHNIQUE: Multidetector CT imaging of the chest was performed using the standard protocol during bolus administration of intravenous contrast. Multiplanar CT image  reconstructions and MIPs were obtained to evaluate the vascular anatomy. Multidetector CT imaging of the abdomen and pelvis was performed using the standard protocol during bolus administration of intravenous contrast. CONTRAST:  ISOVUE-370 IOPAMIDOL (ISOVUE-370) INJECTION 76% COMPARISON:  Chest CT 01/18/2017.  Abdominopelvic CT 05/06/2017. FINDINGS: CTA CHEST FINDINGS Cardiovascular: The pulmonary arteries are well  opacified with contrast to the level of the subsegmental branches. There is no evidence of acute pulmonary embolism. Minimal aortic atherosclerosis without acute systemic arterial abnormalities. The heart size is normal. There is no pericardial effusion. Mediastinum/Nodes: There are no enlarged mediastinal, hilar or axillary lymph nodes. The thyroid gland, trachea and esophagus demonstrate no significant findings. Lungs/Pleura: There is no pleural effusion. The lungs are clear. Musculoskeletal/Chest wall: There is no chest wall mass or suspicious osseous finding. Bilateral subpectoral breast implants. Several old rib fractures on the right, incompletely healed. Review of the MIP images confirms the above findings. CT ABDOMEN and PELVIS FINDINGS Hepatobiliary: Changes of hepatic cirrhosis are again noted. There is progressive capsular retraction, volume loss and ill-defined increased density anteriorly in the right hepatic lobe. Ill-defined hyperdense 18 mm lesion in the left lobe on image 19/2 is stable. No evidence of gallstones, gallbladder wall thickening or biliary dilatation. Pancreas: Unremarkable. No pancreatic ductal dilatation or surrounding inflammatory changes. Spleen: Spleen size is stable at the upper limits of normal. No focal abnormality. Adrenals/Urinary Tract: Both adrenal glands appear normal. Both kidneys appear normal without evidence of urinary tract calculus, renal mass hydronephrosis. The bladder appears normal. Stomach/Bowel: No enteric contrast was administered. The stomach  and right colon are incompletely distended. Allowing for this, no apparent bowel wall thickening or surrounding inflammation. The appendix appears normal. Vascular/Lymphatic: Several prominent lymph nodes in the porta hepatis and gastrohepatic ligament are similar to the previous study, likely reactive and related to the patient's cirrhosis. There is no retroperitoneal lymphadenopathy. There is mild aortic and branch vessel atherosclerosis. Small gastric and distal esophageal varices. Recanalized left paraumbilical vein. Stable mild dilatation of the left ovarian vein. Reproductive: The uterus and ovaries appear normal. No evidence of adnexal mass. Other: Stable postsurgical changes in the periumbilical anterior abdominal wall with skin thickening and increased density in the underlying subcutaneous fat. There is no focal fluid collection or hernia. There is a small amount of ascites around the liver and in the pelvis, increased from the prior study. Musculoskeletal: No acute osseous findings. Chronic sclerosis of the left femoral head without collapse, consistent with mild chronic avascular necrosis. Right iliopsoas bursal fluid collection appears stable. IMPRESSION: 1. No evidence of acute pulmonary embolism or other acute chest findings. 2. Healing right-sided rib fractures. 3. Progressive changes of hepatic cirrhosis with probable confluent hepatic fibrosis. Hypervascular lesion in the left lobe is stable. 4. Stable findings consistent with mild portal hypertension with borderline splenomegaly, varices and increasing ascites. 5.  Aortic Atherosclerosis (ICD10-I70.0). Electronically Signed   By: Carey Bullocks M.D.   On: 06/12/2017 12:26    Procedures Procedures (including critical care time)  Medications Ordered in ED Medications  ondansetron (ZOFRAN) injection 4 mg (0 mg Intravenous Hold 06/12/17 1059)  iopamidol (ISOVUE-370) 76 % injection (has no administration in time range)  ipratropium-albuterol  (DUONEB) 0.5-2.5 (3) MG/3ML nebulizer solution 3 mL (3 mLs Nebulization Given 06/12/17 1037)  cefTRIAXone (ROCEPHIN) 1 g in sodium chloride 0.9 % 100 mL IVPB (0 g Intravenous Stopped 06/12/17 1200)  0.9 %  sodium chloride infusion ( Intravenous New Bag/Given 06/12/17 1110)  iopamidol (ISOVUE-370) 76 % injection 100 mL (100 mLs Intravenous Contrast Given 06/12/17 1116)  ipratropium-albuterol (DUONEB) 0.5-2.5 (3) MG/3ML nebulizer solution 3 mL (3 mLs Nebulization Given 06/12/17 1300)  ondansetron (ZOFRAN) injection 4 mg (4 mg Intravenous Given 06/12/17 1300)  methylPREDNISolone sodium succinate (SOLU-MEDROL) 125 mg/2 mL injection 125 mg (125 mg Intravenous Given 06/12/17 1417)  ipratropium-albuterol (DUONEB) 0.5-2.5 (  3) MG/3ML nebulizer solution 3 mL (3 mLs Nebulization Given 06/12/17 1417)  LORazepam (ATIVAN) injection 1 mg (1 mg Intravenous Given 06/12/17 1417)     Initial Impression / Assessment and Plan / ED Course  I have reviewed the triage vital signs and the nursing notes.  Pertinent labs & imaging results that were available during my care of the patient were reviewed by me and considered in my medical decision making (see chart for details).  Clinical Course as of Jun 13 1507  Sun Jun 12, 2017  1013 57 yo F with COPD, cirrhosis, EtOH abuse, MS, here with abd pain, cough, SOB. Hypoxic on arrival. Concern for possible PNA in setting of recent rib fx, aspiration, pulm edema 2/2 cirrhosis. Abd also tender, consider pancreatitis, alcoholic gastritis. Will check CXR, CT, labs.   [CI]  1051 Does have some urinary sx - will treat with Rocephin, though no pyuria.  Urinalysis, Routine w reflex microscopic(!) [CI]  1051 Minimally elevated, at baseline likely 2/2 liver disease  I-Stat CG4 Lactic Acid, ED(!!) [CI]  1054 CXR neg. Unclear etiology of her hypoxia. Will add on CT of chest in addition to Abd/Pelvis.  DG Chest 2 View [CI]  1321 Pt persistently hypoxic. On tele, as low as 86 on 2-3L. She does have  wheezing, SOB, cough. Will give steroids. Given her significant ongoing hypoxia, will admit for COPD, hypoxia. Will need tx for EtOH w/d as well most likely   [CI]    Clinical Course User Index [CI] Shaune Pollack, MD     Final Clinical Impressions(s) / ED Diagnoses   Final diagnoses:  Hypoxia  Alcoholic cirrhosis of liver with ascites (HCC)  COPD exacerbation (HCC)     Shaune Pollack, MD 06/12/17 [phone]

## 2017-06-12 NOTE — ED Triage Notes (Signed)
She phoned EMS d/t c/o abd. Pain and nausea. She states she was recently seen at [facility] for same. She also tells me "when I was at [facility] I had atrial fib and the doctor wanted to admit me, but I was stupid and I left because I got impatient" [sic]. She is in no distress. She has a chronic, vertical surgical wound at mid lower abd. Area which she states she has had for "months--I had an infected hernia surgery". She acts strangely, as if intoxicated.

## 2017-06-12 NOTE — Clinical Social Work Note (Signed)
Clinical Social Work Assessment  Patient Details  Name: Anne Black MRN: [id number] Date of Birth: [birth year]  Date of referral:  06/12/17               Reason for consult:  Transportation, Other (Comment Required)(CSW consulted for resources for transportation to detox)                Permission sought to share information with:    Permission granted to share information::     Name::        Agency::     Relationship::     Contact Information:     Housing/Transportation Living arrangements for the past 2 months:  Single Family Home Source of Information:  Patient Patient Interpreter Needed:  None Criminal Activity/Legal Involvement Pertinent to Current Situation/Hospitalization:  No - Comment as needed Significant Relationships:  Spouse(Ex-husband) Lives with:  Self Do you feel safe going back to the place where you live?  No(Patient reported that she is unable to care for herself) Need for family participation in patient care:  No (Coment)  Care giving concerns:  Patient reported that she is from home alone and requires assistance with bathing and dressing. Patient reported that she uses a walker to get around her home and that she has been having issues taking care of her self. Patient reported that she is still able to drive and that her ex-husband bought her some groceries. Patient reported that she hired a man and woman to clean her home and to buy things for her home.    Social Worker assessment / plan:  CSW spoke with patient at bedside regarding consult for transportation to detox. Patient reported that she is interested in SNF. Patient reported that she has not been able to care for herself. CSW explained the difference between ST rehab and [facility] term care at [facility] and payor sources. Patient reported that she is unable to private pay for SNF. Patient reported that she receives approx 2000/month in SSI and has a 401K Savings Plan. CSW explained that patient's insurance only covers  ST rehab at [facility] and insurance authorization is required. Patient reported that she feels she needs to be admitted and maybe her insurance will authorize SNF tomorrow. CSW informed patient that patient would need a medical reason to be admitted. CSW inquired about patient's alcohol use, patient reports that she drinks daily. CSW inquired about how patient is able to get alcohol, patient reported that she drives to the drug store to get alcohol. CSW inquired about patient's desire to stop drinking, patient reported that she has to stop drinking or she is going to die. Patient reported that right now she just wants to go to SNF to get better. CSW explained to patient that insurance authorization is needed for ST rehab at SNF if patient is unable to private pay for SNF, patient verbalized understanding. CSW agreed to follow up with patient's attending EDP regarding patient's plan of treatment.   CSW spoke with patient's EDP, EDP reported that patient may be admitted for COPD.  CSW will continue to follow and assist with discharge planning, as appropriate.   Employment status:  Disabled (Comment on whether or not currently receiving Disability) Insurance information:  Managed Care PT Recommendations:  Not assessed at this time Information / Referral to community resources:  Skilled Nursing Facility  Patient/Family's Response to care:  Patient concerned about returning home. Patient hopeful to be admitted to hospital and discharged to SNF.   Patient/Family's  Understanding of and Emotional Response to Diagnosis, Current Treatment, and Prognosis:  Patient presented calm and verbalized that she has not been able to care for self at home. CSW and patient discussed patient's current condition and patient's drinking. Patient reported that she has to stop drinking or she is going to die. CSW provided active listening and positively affirmed patient's plan to stop drinking.   Emotional Assessment Appearance:   Appears older than stated age, Disheveled Attitude/Demeanor/Rapport:  Engaged Affect (typically observed):  Calm Orientation:  Oriented to Self, Oriented to Place, Oriented to  Time, Oriented to Situation Alcohol / Substance use:  Alcohol Use Psych involvement (Current and /or in the community):  No (Comment)  Discharge Needs  Concerns to be addressed:  Care Coordination Readmission within the last 30 days:  No Current discharge risk:  Lives alone Barriers to Discharge:  Continued Medical Work up   [employer], LCSW 06/12/2017, 2:11 PM

## 2017-06-12 NOTE — H&P (Signed)
History and Physical    Anne Black YQM:[id number] DOB: 28-Aug-1960 DOA: 06/12/2017  PCP: Forrest Moron, MD Patient coming from: Home.   I have personally briefly reviewed patient's old medical records in [place]  Chief Complaint: sob.   HPI: Anne Black is a 57 y.o. female with medical history significant of ETOH dependence bipolar disorder, copd, liver cirrhosis, comes in for sob, productive cough for 3 to4 days now. She also reports nausea, vomiting and mild abdominal pain.  She denies any fever or chills, chest pain, palpitations, syncope. She is lethargic, but would open eyes to answer questions and doze off. She denies dysuria, headache, dizziness,. Lab work in ED revealed creatinine of 0.31, alk phos of 138 and bnp of 115, lactic acid of 1.94 and wbc count of 3.2 and hemoglobin of 11.4. She was also found to be hypoxic on arrival and required up to 2 lit of [place] oxygen, and was diffusely wheezing.  CT abd and pelvis and chest ruled out PE, showed ascites, liver cirrhosis, splenomegaly and stable varices.  She was referred to medical service for evaluation and management of COPD exacerbation.     Review of Systems: As per HPI otherwise 10 point review of systems negative.    Past Medical History:  Diagnosis Date  . ADHD (attention deficit hyperactivity disorder)   . Anxiety   . Arthritis   . Ascites   . Bipolar disorder ([place])   . Cirrhosis ([place])   . COPD (chronic obstructive pulmonary disease) ([place])   . Hypokalemia   . Migraine   . Multiple sclerosis ([place])   . Narcolepsy   . Osteoporosis     Past Surgical History:  Procedure Laterality Date  . ABDOMINAL WALL DEFECT REPAIR N/A 12/30/2016   Procedure: EXPLORATORY LAPAROTOMY WITH REPAIR ABDOMINAL WALL VENTRAL HERNIA;  Surgeon: Rolm Bookbinder, MD;  Location: [facility];  Service: General;  Laterality: N/A;  . APPLICATION OF WOUND VAC N/A 12/30/2016   Procedure: APPLICATION OF WOUND VAC;  Surgeon: Rolm Bookbinder, MD;  Location: [facility];  Service: General;  Laterality: N/A;  . CESAREAN SECTION  [phone]  . FRACTURE SURGERY    . HERNIA REPAIR    . MYRINGOTOMY WITH TUBE PLACEMENT Bilateral   . ORIF WRIST FRACTURE Left 09/09/2016   Procedure: OPEN REDUCTION INTERNAL FIXATION (ORIF) LEFT WRIST FRACTURE, LEFT CARPAL TUNNEL RELEASE;  Surgeon: Roseanne Kaufman, MD;  Location: [facility];  Service: Orthopedics;  Laterality: Left;  . TONSILLECTOMY       reports that she has been smoking cigarettes.  She has a 35.00 pack-year smoking history. She has never used smokeless tobacco. She reports that she drinks about 16.8 oz of alcohol per week. She reports that she does not use drugs.  No Known Allergies  Family History  Problem Relation Age of Onset  . Arrhythmia Mother   . Heart disease Father   . Hypertension Father    Reviewed and pertinent.   Prior to Admission medications   Medication Sig Start Date End Date Taking? Authorizing Provider  acetaminophen (TYLENOL) 500 MG tablet Take 1 tablet (500 mg total) by mouth every 6 (six) hours as needed for mild pain or headache. 03/11/17  Yes Arrien, Jimmy Picket, MD  albuterol (PROVENTIL HFA;VENTOLIN HFA) 108 (90 Base) MCG/ACT inhaler Inhale 2 puffs into the lungs every 6 (six) hours as needed for wheezing or shortness of breath.   Yes [provider]  FLUoxetine (PROZAC) 20 MG capsule Take 20 mg  by mouth daily.  03/30/16  Yes [provider]  fluticasone (FLONASE) 50 MCG/ACT nasal spray Place 1 spray into both nostrils as needed for allergies or rhinitis.   Yes [provider]  Fluticasone-Salmeterol (ADVAIR) 100-50 MCG/DOSE AEPB Inhale 1-2 puffs 2 (two) times daily into the lungs.  11/05/14  Yes [provider]  folic acid (FOLVITE) 1 MG tablet Take 1 tablet (1 mg total) by mouth daily. 04/21/17  Yes Sheikh, Omair Latif, DO  furosemide (LASIX) 40 MG tablet Take 1 tablet (40 mg total) by mouth daily. 06/17/16  Yes Lucilla Lame,  MD  gabapentin (NEURONTIN) 100 MG capsule Take 100 mg by mouth 3 (three) times daily. 06/02/17  Yes [provider]  hydrocortisone cream 1 % Apply topically 3 (three) times daily. 04/20/17  Yes Sheikh, Omair Latif, DO  hydrOXYzine (VISTARIL) 25 MG capsule Take 25 mg by mouth 3 (three) times daily as needed for anxiety or itching.  05/08/17  Yes [provider]  ipratropium-albuterol (DUONEB) 0.5-2.5 (3) MG/3ML SOLN Take 3 mLs by nebulization every 6 (six) hours as needed. 03/11/17 06/12/17 Yes Arrien, Jimmy Picket, MD  lidocaine (LIDODERM) 5 % Place 1 patch onto the skin daily. Remove & Discard patch within 12 hours or as directed by MD 04/21/17  Yes Alfredia Ferguson, Omair Latif, DO  lisdexamfetamine (VYVANSE) 70 MG capsule Take 1 capsule (70 mg total) by mouth daily. 05/06/17  Yes Plunkett, Loree Fee, MD  Menthol-Methyl Salicylate (MUSCLE RUB) 10-15 % CREA Apply 1 application topically as needed for muscle pain. 04/20/17  Yes Sheikh, Omair Latif, DO  modafinil (PROVIGIL) 200 MG tablet Take 200 mg by mouth 2 (two) times daily.  08/04/16  Yes [provider]  nicotine (NICODERM CQ - DOSED IN MG/24 HOURS) 21 mg/24hr patch Place 1 patch (21 mg total) onto the skin daily. 04/21/17  Yes Sheikh, Omair Latif, DO  ondansetron (ZOFRAN ODT) 4 MG disintegrating tablet Take 1 tablet (4 mg total) by mouth every 8 (eight) hours as needed for nausea or vomiting. 03/25/17  Yes Carlisle Cater, PA-C  pantoprazole (PROTONIX) 40 MG tablet Take 1 tablet (40 mg total) by mouth daily. 12/30/15  Yes Haney, Alyssa A, MD  potassium chloride SA (K-DUR,KLOR-CON) 20 MEQ tablet Take 20 mEq by mouth daily.   Yes [provider]  risperiDONE (RISPERDAL) 0.5 MG tablet Take 1 tablet (0.5 mg total) by mouth 3 (three) times daily as needed (agitation). Patient taking differently: Take 0.5 mg by mouth 2 (two) times daily.  02/06/16  Yes Eloise Levels, MD  rizatriptan (MAXALT) 10 MG tablet Take 1 tablet (10 mg total) by  mouth as needed for migraine. May repeat in 2 hours if needed 03/25/17  Yes Carlisle Cater, PA-C  spironolactone (ALDACTONE) 100 MG tablet Take 1 tablet (100 mg total) by mouth daily. 11/23/16  Yes Varney Biles, MD  topiramate (TOPAMAX) 100 MG tablet Take 1 tablet (100 mg total) by mouth daily. 01/07/16  Yes Smiley Houseman, MD  oxyCODONE (OXY IR/ROXICODONE) 5 MG immediate release tablet Take 1 tablet (5 mg total) by mouth every 6 (six) hours as needed for severe pain. Patient not taking: Reported on 06/12/2017 04/20/17   Raiford Noble Latif, DO  predniSONE (DELTASONE) 20 MG tablet Take 2 tablets (40 mg total) by mouth daily with breakfast. Patient not taking: Reported on 06/12/2017 04/21/17   Raiford Noble Latif, DO  thiamine 100 MG tablet Take 1 tablet (100 mg total) by mouth daily. Patient not taking: Reported on  06/12/2017 04/21/17   Kerney Elbe, DO    Physical Exam: Vitals:   06/12/17 1430 06/12/17 1530 06/12/17 1600 06/12/17 1742  BP: 115/67 128/75 (!) 113/52 129/82  Pulse: 90 95 86 83  Resp: 19 (!) 21  (!) 22  Temp:      TempSrc:      SpO2: (!) 89% 94% 94% 100%    Constitutional: NAD, calm, comfortable Vitals:   06/12/17 1430 06/12/17 1530 06/12/17 1600 06/12/17 1742  BP: 115/67 128/75 (!) 113/52 129/82  Pulse: 90 95 86 83  Resp: 19 (!) 21  (!) 22  Temp:      TempSrc:      SpO2: (!) 89% 94% 94% 100%   Eyes: PERRL, lids and conjunctivae normal ENMT: Mucous membranes are moist.  Neck: normal, supple, no masses, no thyromegaly Respiratory: scattered wheezing heard. Air entry fair.  Cardiovascular: Regular rate and rhythm, no murmurs / rubs / gallops.  No carotid bruits.  Abdomen: abd wall wound from hernia repair surgery, distended,mildly tender around the wound . Bowel sounds positive.  Musculoskeletal: no clubbing / cyanosis.  Skin: no rashes, lesions, ulcers. No induration Neurologic: CN 2-12 grossly intact. Sensation intact, Psychiatric: Normal judgment and  insight. Alert and oriented x 3. Normal mood.    Labs on Admission: I have personally reviewed following labs and imaging studies  CBC: Recent Labs  Lab 06/12/17 1005  WBC 3.2*  NEUTROABS 1.8  HGB 11.4*  HCT 37.0  MCV 87.3  PLT 409*   Basic Metabolic Panel: Recent Labs  Lab 06/12/17 1005  NA 139  K 3.7  CL 100*  CO2 26  GLUCOSE 83  BUN 5*  CREATININE 0.31*  CALCIUM 8.0*   GFR: Estimated Creatinine Clearance: 72.4 mL/min (A) (by C-G formula based on SCr of 0.31 mg/dL (L)). Liver Function Tests: Recent Labs  Lab 06/12/17 1005  AST 39  ALT 22  ALKPHOS 138*  BILITOT 1.0  PROT 5.8*  ALBUMIN 3.3*   Recent Labs  Lab 06/12/17 1005  LIPASE 25   No results for input(s): AMMONIA in the last 168 hours. Coagulation Profile: No results for input(s): INR, PROTIME in the last 168 hours. Cardiac Enzymes: No results for input(s): CKTOTAL, CKMB, CKMBINDEX, TROPONINI in the last 168 hours. BNP (last 3 results) No results for input(s): PROBNP in the last 8760 hours. HbA1C: No results for input(s): HGBA1C in the last 72 hours. CBG: No results for input(s): GLUCAP in the last 168 hours. Lipid Profile: No results for input(s): CHOL, HDL, LDLCALC, TRIG, CHOLHDL, LDLDIRECT in the last 72 hours. Thyroid Function Tests: No results for input(s): TSH, T4TOTAL, FREET4, T3FREE, THYROIDAB in the last 72 hours. Anemia Panel: No results for input(s): VITAMINB12, FOLATE, FERRITIN, TIBC, IRON, RETICCTPCT in the last 72 hours. Urine analysis:    Component Value Date/Time   COLORURINE YELLOW 06/12/2017 1005   APPEARANCEUR CLEAR 06/12/2017 1005   LABSPEC 1.004 (L) 06/12/2017 1005   PHURINE 6.0 06/12/2017 1005   GLUCOSEU NEGATIVE 06/12/2017 1005   HGBUR SMALL (A) 06/12/2017 1005   BILIRUBINUR NEGATIVE 06/12/2017 1005   BILIRUBINUR moderate (A) 06/19/2015 1906   KETONESUR NEGATIVE 06/12/2017 1005   PROTEINUR NEGATIVE 06/12/2017 1005   UROBILINOGEN >=8.0 06/19/2015 1906    UROBILINOGEN 0.2 09/14/2011 2141   NITRITE POSITIVE (A) 06/12/2017 1005   LEUKOCYTESUR NEGATIVE 06/12/2017 1005    Radiological Exams on Admission: Dg Chest 2 View  Result Date: 06/12/2017 CLINICAL DATA:  Cough and dyspnea EXAM: CHEST - 2  VIEW COMPARISON:  05/30/2017 chest radiograph. FINDINGS: Stable cardiomediastinal silhouette with mild cardiomegaly. No pneumothorax. No pleural effusion. Lungs appear clear, with no acute consolidative airspace disease and no pulmonary edema. IMPRESSION: Stable mild cardiomegaly without pulmonary edema. No active pulmonary disease. Electronically Signed   By: Ilona Sorrel M.D.   On: 06/12/2017 10:39   Ct Angio Chest Pe W And/or Wo Contrast  Result Date: 06/12/2017 CLINICAL DATA:  Chest and abdominal pain with nausea. Atrial fibrillation. Abdominal surgical wound. Clinical suspicion of pulmonary embolism. EXAM: CT ANGIOGRAPHY CHEST CT ABDOMEN AND PELVIS WITH CONTRAST TECHNIQUE: Multidetector CT imaging of the chest was performed using the standard protocol during bolus administration of intravenous contrast. Multiplanar CT image reconstructions and MIPs were obtained to evaluate the vascular anatomy. Multidetector CT imaging of the abdomen and pelvis was performed using the standard protocol during bolus administration of intravenous contrast. CONTRAST:  133m ISOVUE-370 IOPAMIDOL (ISOVUE-370) INJECTION 76% COMPARISON:  Chest CT 01/18/2017.  Abdominopelvic CT 05/06/2017. FINDINGS: CTA CHEST FINDINGS Cardiovascular: The pulmonary arteries are well opacified with contrast to the level of the subsegmental branches. There is no evidence of acute pulmonary embolism. Minimal aortic atherosclerosis without acute systemic arterial abnormalities. The heart size is normal. There is no pericardial effusion. Mediastinum/Nodes: There are no enlarged mediastinal, hilar or axillary lymph nodes. The thyroid gland, trachea and esophagus demonstrate no significant findings. Lungs/Pleura:  There is no pleural effusion. The lungs are clear. Musculoskeletal/Chest wall: There is no chest wall mass or suspicious osseous finding. Bilateral subpectoral breast implants. Several old rib fractures on the right, incompletely healed. Review of the MIP images confirms the above findings. CT ABDOMEN and PELVIS FINDINGS Hepatobiliary: Changes of hepatic cirrhosis are again noted. There is progressive capsular retraction, volume loss and ill-defined increased density anteriorly in the right hepatic lobe. Ill-defined hyperdense 18 mm lesion in the left lobe on image 19/2 is stable. No evidence of gallstones, gallbladder wall thickening or biliary dilatation. Pancreas: Unremarkable. No pancreatic ductal dilatation or surrounding inflammatory changes. Spleen: Spleen size is stable at the upper limits of normal. No focal abnormality. Adrenals/Urinary Tract: Both adrenal glands appear normal. Both kidneys appear normal without evidence of urinary tract calculus, renal mass hydronephrosis. The bladder appears normal. Stomach/Bowel: No enteric contrast was administered. The stomach and right colon are incompletely distended. Allowing for this, no apparent bowel wall thickening or surrounding inflammation. The appendix appears normal. Vascular/Lymphatic: Several prominent lymph nodes in the porta hepatis and gastrohepatic ligament are similar to the previous study, likely reactive and related to the patient's cirrhosis. There is no retroperitoneal lymphadenopathy. There is mild aortic and branch vessel atherosclerosis. Small gastric and distal esophageal varices. Recanalized left paraumbilical vein. Stable mild dilatation of the left ovarian vein. Reproductive: The uterus and ovaries appear normal. No evidence of adnexal mass. Other: Stable postsurgical changes in the periumbilical anterior abdominal wall with skin thickening and increased density in the underlying subcutaneous fat. There is no focal fluid collection or  hernia. There is a small amount of ascites around the liver and in the pelvis, increased from the prior study. Musculoskeletal: No acute osseous findings. Chronic sclerosis of the left femoral head without collapse, consistent with mild chronic avascular necrosis. Right iliopsoas bursal fluid collection appears stable. IMPRESSION: 1. No evidence of acute pulmonary embolism or other acute chest findings. 2. Healing right-sided rib fractures. 3. Progressive changes of hepatic cirrhosis with probable confluent hepatic fibrosis. Hypervascular lesion in the left lobe is stable. 4. Stable findings consistent with mild portal  hypertension with borderline splenomegaly, varices and increasing ascites. 5.  Aortic Atherosclerosis (ICD10-I70.0). Electronically Signed   By: Richardean Sale M.D.   On: 06/12/2017 12:26   Ct Abdomen Pelvis W Contrast  Result Date: 06/12/2017 CLINICAL DATA:  Chest and abdominal pain with nausea. Atrial fibrillation. Abdominal surgical wound. Clinical suspicion of pulmonary embolism. EXAM: CT ANGIOGRAPHY CHEST CT ABDOMEN AND PELVIS WITH CONTRAST TECHNIQUE: Multidetector CT imaging of the chest was performed using the standard protocol during bolus administration of intravenous contrast. Multiplanar CT image reconstructions and MIPs were obtained to evaluate the vascular anatomy. Multidetector CT imaging of the abdomen and pelvis was performed using the standard protocol during bolus administration of intravenous contrast. CONTRAST:  133m ISOVUE-370 IOPAMIDOL (ISOVUE-370) INJECTION 76% COMPARISON:  Chest CT 01/18/2017.  Abdominopelvic CT 05/06/2017. FINDINGS: CTA CHEST FINDINGS Cardiovascular: The pulmonary arteries are well opacified with contrast to the level of the subsegmental branches. There is no evidence of acute pulmonary embolism. Minimal aortic atherosclerosis without acute systemic arterial abnormalities. The heart size is normal. There is no pericardial effusion. Mediastinum/Nodes:  There are no enlarged mediastinal, hilar or axillary lymph nodes. The thyroid gland, trachea and esophagus demonstrate no significant findings. Lungs/Pleura: There is no pleural effusion. The lungs are clear. Musculoskeletal/Chest wall: There is no chest wall mass or suspicious osseous finding. Bilateral subpectoral breast implants. Several old rib fractures on the right, incompletely healed. Review of the MIP images confirms the above findings. CT ABDOMEN and PELVIS FINDINGS Hepatobiliary: Changes of hepatic cirrhosis are again noted. There is progressive capsular retraction, volume loss and ill-defined increased density anteriorly in the right hepatic lobe. Ill-defined hyperdense 18 mm lesion in the left lobe on image 19/2 is stable. No evidence of gallstones, gallbladder wall thickening or biliary dilatation. Pancreas: Unremarkable. No pancreatic ductal dilatation or surrounding inflammatory changes. Spleen: Spleen size is stable at the upper limits of normal. No focal abnormality. Adrenals/Urinary Tract: Both adrenal glands appear normal. Both kidneys appear normal without evidence of urinary tract calculus, renal mass hydronephrosis. The bladder appears normal. Stomach/Bowel: No enteric contrast was administered. The stomach and right colon are incompletely distended. Allowing for this, no apparent bowel wall thickening or surrounding inflammation. The appendix appears normal. Vascular/Lymphatic: Several prominent lymph nodes in the porta hepatis and gastrohepatic ligament are similar to the previous study, likely reactive and related to the patient's cirrhosis. There is no retroperitoneal lymphadenopathy. There is mild aortic and branch vessel atherosclerosis. Small gastric and distal esophageal varices. Recanalized left paraumbilical vein. Stable mild dilatation of the left ovarian vein. Reproductive: The uterus and ovaries appear normal. No evidence of adnexal mass. Other: Stable postsurgical changes in the  periumbilical anterior abdominal wall with skin thickening and increased density in the underlying subcutaneous fat. There is no focal fluid collection or hernia. There is a small amount of ascites around the liver and in the pelvis, increased from the prior study. Musculoskeletal: No acute osseous findings. Chronic sclerosis of the left femoral head without collapse, consistent with mild chronic avascular necrosis. Right iliopsoas bursal fluid collection appears stable. IMPRESSION: 1. No evidence of acute pulmonary embolism or other acute chest findings. 2. Healing right-sided rib fractures. 3. Progressive changes of hepatic cirrhosis with probable confluent hepatic fibrosis. Hypervascular lesion in the left lobe is stable. 4. Stable findings consistent with mild portal hypertension with borderline splenomegaly, varices and increasing ascites. 5.  Aortic Atherosclerosis (ICD10-I70.0). Electronically Signed   By: WRichardean SaleM.D.   On: 06/12/2017 12:26    EKG: Independently  reviewed. Sinus rhythm.   Assessment/Plan Active Problems:   COPD exacerbation (HCC)   ACUTE respiratory failure with hypoxia secondary to acute copd exacerbation.  Admit for IV Steroids, duonebs, resume dulera and [place] oxygen to keep sats greater than 90%    UTI:  Obtain urine cultures and continue with rocephin.    Severe alcohol dependence  Watch for withdrawals.  On CIWA.  Social work consult    Abdominal wall wound: pt reports she had umbilical hernia repair and its infected.  Wound care consult and on rocephin.    Alcoholic liver cirrhosis with ascites.  Resume home meds.  Get US paracentesis and check for SBP as pt reports nausea, vomiting and abdominal pain.  She is on rocephin for SBP prophylaxis.    Elevated lactic acid:  Trend lactate.    Pancytopenia:  Suspect from liver cirrhosis.   BIpolar disorder:  Resume home meds      DVT prophylaxis: lovenox.  Code Status: full code.  Family  Communication: none at bedside Disposition Plan: pending resolution of resp symptoms.  Consults called: social work Admission status: med surg.    Hosie Poisson MD [facility] Pager [phone]   If 7PM-7AM, please contact night-coverage [email or web address] Password [email or web address]  06/12/2017, 5:47 PM

## 2017-06-12 NOTE — ED Notes (Signed)
I have just given report to Lupe Carney, RN on [address]; and will transport shortly.

## 2017-06-12 NOTE — ED Notes (Signed)
ED TO INPATIENT HANDOFF REPORT  Name/Age/Gender Anne Black 57 y.o. female  Code Status Code Status History    Date Active Date Inactive Code Status Order ID Comments User Context   04/15/2017 0917 04/21/2017 1545 Full Code [id number]  Bonnell Public, MD Inpatient   03/14/2017 1743 03/15/2017 1622 Full Code [id number]  Rodney Booze, PA-C ED   03/07/2017 2038 03/11/2017 2121 Full Code [id number]  Phillips Grout, MD ED   01/17/2017 1736 01/18/2017 1823 Full Code [id number]  Margarita Mail, PA-C ED   12/30/2016 1233 01/11/2017 1818 Full Code [id number]  Rolm Bookbinder, MD Inpatient   12/30/2016 0101 12/30/2016 1233 Full Code [id number]  Norval Morton, MD ED   12/25/2016 2057 12/26/2016 1554 Full Code [id number]  Little, Wenda Overland, MD ED   12/09/2016 0513 12/09/2016 1250 Full Code [id number]  Rolland Porter, MD ED   09/09/2016 1827 09/11/2016 1949 Full Code [id number]  Vianne Bulls, MD ED   08/08/2016 0145 08/09/2016 1644 Full Code [id number]  Muthersbaugh, Jarrett Soho, PA-C ED   01/22/2016 2359 02/06/2016 1652 Full Code [id number]  Smiley Houseman, MD Inpatient   12/18/2015 1717 12/19/2015 1439 Full Code [id number]  Katheren Shams, DO Inpatient   11/04/2015 1557 11/05/2015 1941 Full Code [id number]  Smiley Houseman, MD Inpatient   10/15/2015 1521 10/24/2015 2108 Full Code [id number]  Nicolette Bang, DO Inpatient   08/22/2015 2112 08/30/2015 1745 Full Code [id number]  Etta Quill, DO ED   07/31/2015 1544 08/04/2015 1647 Full Code [id number]  Patrecia Pour, NP Inpatient   07/31/2015 0041 07/31/2015 1544 Full Code [id number]  Antonietta Breach, PA-C ED   05/18/2015 1531 06/05/2015 1456 Full Code [id number]  Cristal Ford, DO Inpatient   02/21/2015 0136 02/21/2015 1429 Full Code [id number]  Harriet Butte, NP Inpatient   02/20/2015 1737 02/21/2015 0136 Full Code [id number]  Davonna Belling, MD ED   11/12/2014 1616 11/16/2014 1727 Full Code [id number]  Delfin Gant, NP Inpatient   11/12/2014 1606  11/12/2014 1616 Full Code [id number]  Delfin Gant, NP Inpatient   11/12/2014 0141 11/12/2014 1605 Full Code [id number]  Charlann Lange, PA-C ED   10/30/2014 2242 11/05/2014 1626 Full Code [id number]  Delfin Gant, NP Inpatient   10/30/2014 0004 10/30/2014 2242 Full Code [id number]  Wandra Arthurs, MD ED   04/07/2013 2041 04/12/2013 1637 Full Code [id number]  Dara Hoyer, PA-C Inpatient   04/06/2013 2313 04/07/2013 2041 Full Code [id number]  Wandra Arthurs, MD ED   11/01/2011 0423 11/02/2011 1345 Full Code [id number]  Etta Quill., DO ED   09/26/2011 2215 09/28/2011 0145 Full Code [id number]  Veryl Speak, MD ED   05/17/2011 0716 05/20/2011 2152 Full Code [id number]  Luciano Cutter ED      Home/SNF/Other Home  Chief Complaint abd pain  Level of Care/Admitting Diagnosis ED Disposition    ED Disposition Condition [facility] Area: [facility] [[id number]]  Level of Care: Med-Surg [16]  Diagnosis: COPD exacerbation ([place]) [830940]  Admitting Physician: Hosie Poisson [[id number]]  Attending Physician: Hosie Poisson [[id number]]  PT Class (Do Not Modify): Observation [104]  PT Acc Code (Do Not Modify): Observation [10022]       Medical History Past Medical History:  Diagnosis Date  . ADHD (attention deficit hyperactivity disorder)   . Anxiety   . Arthritis   . Ascites   . Bipolar disorder ([place])   . Cirrhosis ([place])   . COPD (  chronic obstructive pulmonary disease) ([place])   . Hypokalemia   . Migraine   . Multiple sclerosis ([place])   . Narcolepsy   . Osteoporosis     Allergies No Known Allergies  IV Location/Drains/Wounds Patient Lines/Drains/Airways Status   Active Line/Drains/Airways    Name:   Placement date:   Placement time:   Site:   Days:   Peripheral IV 06/12/17 Right;Medial;Proximal Forearm   06/12/17    1018    Forearm   less than 1   Incision (Closed) 04/15/17 Abdomen   04/15/17    0900     58          Labs/Imaging Results for orders  placed or performed during the hospital encounter of 06/12/17 (from the past 48 hour(s))  CBC with Differential     Status: Abnormal   Collection Time: 06/12/17 10:05 AM  Result Value Ref Range   WBC 3.2 (L) 4.0 - 10.5 K/uL   RBC 4.24 3.87 - 5.11 MIL/uL   Hemoglobin 11.4 (L) 12.0 - 15.0 g/dL   HCT 37.0 36.0 - 46.0 %   MCV 87.3 78.0 - 100.0 fL   MCH 26.9 26.0 - 34.0 pg   MCHC 30.8 30.0 - 36.0 g/dL   RDW 18.3 (H) 11.5 - 15.5 %   Platelets 107 (L) 150 - 400 K/uL    Comment: RESULT REPEATED AND VERIFIED SPECIMEN CHECKED FOR CLOTS CONSISTENT WITH PREVIOUS RESULT    Neutrophils Relative % 57 %   Neutro Abs 1.8 1.7 - 7.7 K/uL   Lymphocytes Relative 26 %   Lymphs Abs 0.8 0.7 - 4.0 K/uL   Monocytes Relative 9 %   Monocytes Absolute 0.3 0.1 - 1.0 K/uL   Eosinophils Relative 7 %   Eosinophils Absolute 0.2 0.0 - 0.7 K/uL   Basophils Relative 1 %   Basophils Absolute 0.0 0.0 - 0.1 K/uL    Comment: Performed at [facility], [place] [address], [place], [place] [address]  Comprehensive metabolic panel     Status: Abnormal   Collection Time: 06/12/17 10:05 AM  Result Value Ref Range   Sodium 139 135 - 145 mmol/L   Potassium 3.7 3.5 - 5.1 mmol/L   Chloride 100 (L) 101 - 111 mmol/L   CO2 26 22 - 32 mmol/L   Glucose, Bld 83 65 - 99 mg/dL   BUN 5 (L) 6 - 20 mg/dL   Creatinine, Ser 0.31 (L) 0.44 - 1.00 mg/dL   Calcium 8.0 (L) 8.9 - 10.3 mg/dL   Total Protein 5.8 (L) 6.5 - 8.1 g/dL   Albumin 3.3 (L) 3.5 - 5.0 g/dL   AST 39 15 - 41 U/L   ALT 22 14 - 54 U/L   Alkaline Phosphatase 138 (H) 38 - 126 U/L   Total Bilirubin 1.0 0.3 - 1.2 mg/dL   GFR calc non Af Amer >60 >60 mL/min   GFR calc Af Amer >60 >60 mL/min    Comment: (NOTE) The eGFR has been calculated using the CKD EPI equation. This calculation has not been validated in all clinical situations. eGFR's persistently <60 mL/min signify possible Chronic Kidney Disease.    Anion gap 13 5 - 15    Comment: Performed at  [facility], [place] [address], [place], [place] [address]  Lipase, blood     Status: None   Collection Time: 06/12/17 10:05 AM  Result Value Ref Range   Lipase 25 11 - 51 U/L    Comment: Performed  at [facility], [place] [address] [place], [place] [address]  Urinalysis, Routine w reflex microscopic     Status: Abnormal   Collection Time: 06/12/17 10:05 AM  Result Value Ref Range   Color, Urine YELLOW YELLOW   APPearance CLEAR CLEAR   Specific Gravity, Urine 1.004 (L) 1.005 - 1.030   pH 6.0 5.0 - 8.0   Glucose, UA NEGATIVE NEGATIVE mg/dL   Hgb urine dipstick SMALL (A) NEGATIVE   Bilirubin Urine NEGATIVE NEGATIVE   Ketones, ur NEGATIVE NEGATIVE mg/dL   Protein, ur NEGATIVE NEGATIVE mg/dL   Nitrite POSITIVE (A) NEGATIVE   Leukocytes, UA NEGATIVE NEGATIVE   RBC / HPF 0-5 0 - 5 RBC/hpf   WBC, UA 0-5 0 - 5 WBC/hpf   Bacteria, UA MANY (A) NONE SEEN   Squamous Epithelial / LPF 0-5 0 - 5    Comment: Please note change in reference range. Performed at [facility], [place] [address], [place], [place] [address]   Brain natriuretic peptide     Status: Abnormal   Collection Time: 06/12/17 10:05 AM  Result Value Ref Range   B Natriuretic Peptide 115.0 (H) 0.0 - 100.0 pg/mL    Comment: Performed at [facility], [place] [address] [place], [place] [address]  Ethanol     Status: Abnormal   Collection Time: 06/12/17 10:05 AM  Result Value Ref Range   Alcohol, Ethyl (B) 245 (H) <10 mg/dL    Comment:        LOWEST DETECTABLE LIMIT FOR SERUM ALCOHOL IS 10 mg/dL FOR MEDICAL PURPOSES ONLY Performed at [place] [address] [place], [place] [address]   I-Stat Troponin, ED (not at [facility])     Status: None   Collection Time: 06/12/17 10:06 AM  Result Value Ref Range   Troponin i, poc 0.00 0.00 - 0.08 ng/mL   Comment 3            Comment: Due to the release kinetics of cTnI, a negative result within the  first hours of the onset of symptoms does not rule out myocardial infarction with certainty. If myocardial infarction is still suspected, repeat the test at appropriate intervals.   I-Stat CG4 Lactic Acid, ED     Status: Abnormal   Collection Time: 06/12/17 10:07 AM  Result Value Ref Range   Lactic Acid, Venous 2.04 (HH) 0.5 - 1.9 mmol/L   Comment NOTIFIED PHYSICIAN   I-Stat CG4 Lactic Acid, ED     Status: Abnormal   Collection Time: 06/12/17 12:21 PM  Result Value Ref Range   Lactic Acid, Venous 1.94 (H) 0.5 - 1.9 mmol/L   Dg Chest 2 View  Result Date: 06/12/2017 CLINICAL DATA:  Cough and dyspnea EXAM: CHEST - 2 VIEW COMPARISON:  05/30/2017 chest radiograph. FINDINGS: Stable cardiomediastinal silhouette with mild cardiomegaly. No pneumothorax. No pleural effusion. Lungs appear clear, with no acute consolidative airspace disease and no pulmonary edema. IMPRESSION: Stable mild cardiomegaly without pulmonary edema. No active pulmonary disease. Electronically Signed   By: Ilona Sorrel M.D.   On: 06/12/2017 10:39   Ct Angio Chest Pe W And/or Wo Contrast  Result Date: 06/12/2017 CLINICAL DATA:  Chest and abdominal pain with nausea. Atrial fibrillation. Abdominal surgical wound. Clinical suspicion of pulmonary embolism. EXAM: CT ANGIOGRAPHY CHEST CT ABDOMEN AND PELVIS WITH CONTRAST TECHNIQUE: Multidetector CT imaging of the chest was performed using the standard protocol during bolus administration of intravenous contrast. Multiplanar CT image reconstructions and  MIPs were obtained to evaluate the vascular anatomy. Multidetector CT imaging of the abdomen and pelvis was performed using the standard protocol during bolus administration of intravenous contrast. CONTRAST:  125m ISOVUE-370 IOPAMIDOL (ISOVUE-370) INJECTION 76% COMPARISON:  Chest CT 01/18/2017.  Abdominopelvic CT 05/06/2017. FINDINGS: CTA CHEST FINDINGS Cardiovascular: The pulmonary arteries are well opacified with contrast to the level of  the subsegmental branches. There is no evidence of acute pulmonary embolism. Minimal aortic atherosclerosis without acute systemic arterial abnormalities. The heart size is normal. There is no pericardial effusion. Mediastinum/Nodes: There are no enlarged mediastinal, hilar or axillary lymph nodes. The thyroid gland, trachea and esophagus demonstrate no significant findings. Lungs/Pleura: There is no pleural effusion. The lungs are clear. Musculoskeletal/Chest wall: There is no chest wall mass or suspicious osseous finding. Bilateral subpectoral breast implants. Several old rib fractures on the right, incompletely healed. Review of the MIP images confirms the above findings. CT ABDOMEN and PELVIS FINDINGS Hepatobiliary: Changes of hepatic cirrhosis are again noted. There is progressive capsular retraction, volume loss and ill-defined increased density anteriorly in the right hepatic lobe. Ill-defined hyperdense 18 mm lesion in the left lobe on image 19/2 is stable. No evidence of gallstones, gallbladder wall thickening or biliary dilatation. Pancreas: Unremarkable. No pancreatic ductal dilatation or surrounding inflammatory changes. Spleen: Spleen size is stable at the upper limits of normal. No focal abnormality. Adrenals/Urinary Tract: Both adrenal glands appear normal. Both kidneys appear normal without evidence of urinary tract calculus, renal mass hydronephrosis. The bladder appears normal. Stomach/Bowel: No enteric contrast was administered. The stomach and right colon are incompletely distended. Allowing for this, no apparent bowel wall thickening or surrounding inflammation. The appendix appears normal. Vascular/Lymphatic: Several prominent lymph nodes in the porta hepatis and gastrohepatic ligament are similar to the previous study, likely reactive and related to the patient's cirrhosis. There is no retroperitoneal lymphadenopathy. There is mild aortic and branch vessel atherosclerosis. Small gastric and  distal esophageal varices. Recanalized left paraumbilical vein. Stable mild dilatation of the left ovarian vein. Reproductive: The uterus and ovaries appear normal. No evidence of adnexal mass. Other: Stable postsurgical changes in the periumbilical anterior abdominal wall with skin thickening and increased density in the underlying subcutaneous fat. There is no focal fluid collection or hernia. There is a small amount of ascites around the liver and in the pelvis, increased from the prior study. Musculoskeletal: No acute osseous findings. Chronic sclerosis of the left femoral head without collapse, consistent with mild chronic avascular necrosis. Right iliopsoas bursal fluid collection appears stable. IMPRESSION: 1. No evidence of acute pulmonary embolism or other acute chest findings. 2. Healing right-sided rib fractures. 3. Progressive changes of hepatic cirrhosis with probable confluent hepatic fibrosis. Hypervascular lesion in the left lobe is stable. 4. Stable findings consistent with mild portal hypertension with borderline splenomegaly, varices and increasing ascites. 5.  Aortic Atherosclerosis (ICD10-I70.0). Electronically Signed   By: WRichardean SaleM.D.   On: 06/12/2017 12:26   Ct Abdomen Pelvis W Contrast  Result Date: 06/12/2017 CLINICAL DATA:  Chest and abdominal pain with nausea. Atrial fibrillation. Abdominal surgical wound. Clinical suspicion of pulmonary embolism. EXAM: CT ANGIOGRAPHY CHEST CT ABDOMEN AND PELVIS WITH CONTRAST TECHNIQUE: Multidetector CT imaging of the chest was performed using the standard protocol during bolus administration of intravenous contrast. Multiplanar CT image reconstructions and MIPs were obtained to evaluate the vascular anatomy. Multidetector CT imaging of the abdomen and pelvis was performed using the standard protocol during bolus administration of intravenous contrast. CONTRAST:  1033mISOVUE-370  IOPAMIDOL (ISOVUE-370) INJECTION 76% COMPARISON:  Chest CT  01/18/2017.  Abdominopelvic CT 05/06/2017. FINDINGS: CTA CHEST FINDINGS Cardiovascular: The pulmonary arteries are well opacified with contrast to the level of the subsegmental branches. There is no evidence of acute pulmonary embolism. Minimal aortic atherosclerosis without acute systemic arterial abnormalities. The heart size is normal. There is no pericardial effusion. Mediastinum/Nodes: There are no enlarged mediastinal, hilar or axillary lymph nodes. The thyroid gland, trachea and esophagus demonstrate no significant findings. Lungs/Pleura: There is no pleural effusion. The lungs are clear. Musculoskeletal/Chest wall: There is no chest wall mass or suspicious osseous finding. Bilateral subpectoral breast implants. Several old rib fractures on the right, incompletely healed. Review of the MIP images confirms the above findings. CT ABDOMEN and PELVIS FINDINGS Hepatobiliary: Changes of hepatic cirrhosis are again noted. There is progressive capsular retraction, volume loss and ill-defined increased density anteriorly in the right hepatic lobe. Ill-defined hyperdense 18 mm lesion in the left lobe on image 19/2 is stable. No evidence of gallstones, gallbladder wall thickening or biliary dilatation. Pancreas: Unremarkable. No pancreatic ductal dilatation or surrounding inflammatory changes. Spleen: Spleen size is stable at the upper limits of normal. No focal abnormality. Adrenals/Urinary Tract: Both adrenal glands appear normal. Both kidneys appear normal without evidence of urinary tract calculus, renal mass hydronephrosis. The bladder appears normal. Stomach/Bowel: No enteric contrast was administered. The stomach and right colon are incompletely distended. Allowing for this, no apparent bowel wall thickening or surrounding inflammation. The appendix appears normal. Vascular/Lymphatic: Several prominent lymph nodes in the porta hepatis and gastrohepatic ligament are similar to the previous study, likely reactive  and related to the patient's cirrhosis. There is no retroperitoneal lymphadenopathy. There is mild aortic and branch vessel atherosclerosis. Small gastric and distal esophageal varices. Recanalized left paraumbilical vein. Stable mild dilatation of the left ovarian vein. Reproductive: The uterus and ovaries appear normal. No evidence of adnexal mass. Other: Stable postsurgical changes in the periumbilical anterior abdominal wall with skin thickening and increased density in the underlying subcutaneous fat. There is no focal fluid collection or hernia. There is a small amount of ascites around the liver and in the pelvis, increased from the prior study. Musculoskeletal: No acute osseous findings. Chronic sclerosis of the left femoral head without collapse, consistent with mild chronic avascular necrosis. Right iliopsoas bursal fluid collection appears stable. IMPRESSION: 1. No evidence of acute pulmonary embolism or other acute chest findings. 2. Healing right-sided rib fractures. 3. Progressive changes of hepatic cirrhosis with probable confluent hepatic fibrosis. Hypervascular lesion in the left lobe is stable. 4. Stable findings consistent with mild portal hypertension with borderline splenomegaly, varices and increasing ascites. 5.  Aortic Atherosclerosis (ICD10-I70.0). Electronically Signed   By: Richardean Sale M.D.   On: 06/12/2017 12:26    Pending Labs Unresulted Labs (From admission, onward)   Start     Ordered   06/12/17 1052  Urine culture  STAT,   STAT    Question:  Patient immune status  Answer:  Normal   06/12/17 1051   Signed and Held  Creatinine, serum  (enoxaparin (LOVENOX)    CrCl >/= 30 ml/min)  Weekly,   R    Comments:  while on enoxaparin therapy    Signed and Held      Vitals/Pain Today's Vitals   06/12/17 1400 06/12/17 1430 06/12/17 1530 06/12/17 1600  BP: (!) 122/55 115/67 128/75 (!) 113/52  Pulse: 85 90 95 86  Resp: (!) 27 19 (!) 21   Temp:  TempSrc:      SpO2: 96%  (!) 89% 94% 94%    Isolation Precautions No active isolations  Medications Medications  ondansetron (ZOFRAN) injection 4 mg (0 mg Intravenous Hold 06/12/17 1059)  iopamidol (ISOVUE-370) 76 % injection (has no administration in time range)  ipratropium-albuterol (DUONEB) 0.5-2.5 (3) MG/3ML nebulizer solution 3 mL (3 mLs Nebulization Given 06/12/17 1037)  cefTRIAXone (ROCEPHIN) 1 g in sodium chloride 0.9 % 100 mL IVPB (0 g Intravenous Stopped 06/12/17 1200)  0.9 %  sodium chloride infusion ( Intravenous Transfusing/Transfer 06/12/17 1611)  iopamidol (ISOVUE-370) 76 % injection 100 mL (100 mLs Intravenous Contrast Given 06/12/17 1116)  ipratropium-albuterol (DUONEB) 0.5-2.5 (3) MG/3ML nebulizer solution 3 mL (3 mLs Nebulization Given 06/12/17 1300)  ondansetron (ZOFRAN) injection 4 mg (4 mg Intravenous Given 06/12/17 1300)  methylPREDNISolone sodium succinate (SOLU-MEDROL) 125 mg/2 mL injection 125 mg (125 mg Intravenous Given 06/12/17 1417)  ipratropium-albuterol (DUONEB) 0.5-2.5 (3) MG/3ML nebulizer solution 3 mL (3 mLs Nebulization Given 06/12/17 1417)  LORazepam (ATIVAN) injection 1 mg (1 mg Intravenous Given 06/12/17 1417)    Mobility walks with device

## 2017-06-12 NOTE — ED Notes (Signed)
Patient transported to CT 

## 2017-06-12 NOTE — ED Notes (Signed)
She declines to attempt to ambulate, citing "I feel too shaky and weak".

## 2017-06-12 NOTE — ED Notes (Signed)
Bed: ZO10 Expected date:  Expected time:  Means of arrival:  Comments: 57 yo abd pain, nausea

## 2017-06-12 NOTE — Progress Notes (Signed)
Patient arrived on unit via stretcher from ED.  No family at bedside.  

## 2017-06-12 NOTE — ED Notes (Signed)
Pulmonary

## 2017-06-13 ENCOUNTER — Inpatient Hospital Stay (HOSPITAL_COMMUNITY): Payer: No Typology Code available for payment source

## 2017-06-13 DIAGNOSIS — R0602 Shortness of breath: Secondary | ICD-10-CM | POA: Diagnosis present

## 2017-06-13 DIAGNOSIS — K7031 Alcoholic cirrhosis of liver with ascites: Secondary | ICD-10-CM | POA: Diagnosis present

## 2017-06-13 DIAGNOSIS — L03311 Cellulitis of abdominal wall: Secondary | ICD-10-CM | POA: Diagnosis not present

## 2017-06-13 DIAGNOSIS — J9601 Acute respiratory failure with hypoxia: Secondary | ICD-10-CM

## 2017-06-13 DIAGNOSIS — G609 Hereditary and idiopathic neuropathy, unspecified: Secondary | ICD-10-CM | POA: Diagnosis not present

## 2017-06-13 DIAGNOSIS — D61818 Other pancytopenia: Secondary | ICD-10-CM | POA: Diagnosis present

## 2017-06-13 DIAGNOSIS — R0902 Hypoxemia: Secondary | ICD-10-CM | POA: Diagnosis not present

## 2017-06-13 DIAGNOSIS — R140 Abdominal distension (gaseous): Secondary | ICD-10-CM | POA: Diagnosis not present

## 2017-06-13 DIAGNOSIS — S2241XD Multiple fractures of ribs, right side, subsequent encounter for fracture with routine healing: Secondary | ICD-10-CM | POA: Diagnosis not present

## 2017-06-13 DIAGNOSIS — F10239 Alcohol dependence with withdrawal, unspecified: Secondary | ICD-10-CM | POA: Diagnosis present

## 2017-06-13 DIAGNOSIS — Y908 Blood alcohol level of 240 mg/100 ml or more: Secondary | ICD-10-CM | POA: Diagnosis present

## 2017-06-13 DIAGNOSIS — K219 Gastro-esophageal reflux disease without esophagitis: Secondary | ICD-10-CM | POA: Diagnosis present

## 2017-06-13 DIAGNOSIS — F319 Bipolar disorder, unspecified: Secondary | ICD-10-CM

## 2017-06-13 DIAGNOSIS — R5381 Other malaise: Secondary | ICD-10-CM | POA: Diagnosis present

## 2017-06-13 DIAGNOSIS — F1020 Alcohol dependence, uncomplicated: Secondary | ICD-10-CM | POA: Diagnosis not present

## 2017-06-13 DIAGNOSIS — F419 Anxiety disorder, unspecified: Secondary | ICD-10-CM | POA: Diagnosis present

## 2017-06-13 DIAGNOSIS — J441 Chronic obstructive pulmonary disease with (acute) exacerbation: Secondary | ICD-10-CM | POA: Diagnosis present

## 2017-06-13 DIAGNOSIS — J449 Chronic obstructive pulmonary disease, unspecified: Secondary | ICD-10-CM | POA: Diagnosis not present

## 2017-06-13 DIAGNOSIS — N390 Urinary tract infection, site not specified: Secondary | ICD-10-CM | POA: Diagnosis present

## 2017-06-13 DIAGNOSIS — E876 Hypokalemia: Secondary | ICD-10-CM | POA: Diagnosis present

## 2017-06-13 DIAGNOSIS — F17210 Nicotine dependence, cigarettes, uncomplicated: Secondary | ICD-10-CM | POA: Diagnosis present

## 2017-06-13 DIAGNOSIS — Z8719 Personal history of other diseases of the digestive system: Secondary | ICD-10-CM | POA: Diagnosis not present

## 2017-06-13 DIAGNOSIS — G35 Multiple sclerosis: Secondary | ICD-10-CM | POA: Diagnosis present

## 2017-06-13 DIAGNOSIS — Z7951 Long term (current) use of inhaled steroids: Secondary | ICD-10-CM | POA: Diagnosis not present

## 2017-06-13 DIAGNOSIS — F909 Attention-deficit hyperactivity disorder, unspecified type: Secondary | ICD-10-CM | POA: Diagnosis present

## 2017-06-13 DIAGNOSIS — Z79899 Other long term (current) drug therapy: Secondary | ICD-10-CM | POA: Diagnosis not present

## 2017-06-13 LAB — BASIC METABOLIC PANEL
ANION GAP: 10 (ref 5–15)
BUN: 8 mg/dL (ref 6–20)
CHLORIDE: 98 mmol/L — AB (ref 101–111)
CO2: 28 mmol/L (ref 22–32)
Calcium: 8.7 mg/dL — ABNORMAL LOW (ref 8.9–10.3)
Creatinine, Ser: 0.36 mg/dL — ABNORMAL LOW (ref 0.44–1.00)
GFR calc non Af Amer: >60 mL/min (ref >60)
Glucose, Bld: 154 mg/dL — ABNORMAL HIGH (ref 65–99)
Potassium: 4.1 mmol/L (ref 3.5–5.1)
Sodium: 136 mmol/L (ref 135–145)

## 2017-06-13 LAB — CBC
HEMATOCRIT: 35.5 % — AB (ref 36.0–46.0)
HEMOGLOBIN: 11 g/dL — AB (ref 12.0–15.0)
MCH: 26.9 pg (ref 26.0–34.0)
MCHC: 31 g/dL (ref 30.0–36.0)
MCV: 86.8 fL (ref 78.0–100.0)
Platelets: 123 10*3/uL — ABNORMAL LOW (ref 150–400)
RBC: 4.09 MIL/uL (ref 3.87–5.11)
RDW: 18.2 % — ABNORMAL HIGH (ref 11.5–15.5)
WBC: 2.8 10*3/uL — ABNORMAL LOW (ref 4.0–10.5)

## 2017-06-13 LAB — AMMONIA: AMMONIA: 62 umol/L — AB (ref 9–35)

## 2017-06-13 MED ORDER — FUROSEMIDE 10 MG/ML IJ SOLN
20.0000 mg | Freq: Once | INTRAMUSCULAR | Status: AC
  Administered 2017-06-13: 20 mg via INTRAVENOUS
  Filled 2017-06-13: qty 2

## 2017-06-13 MED ORDER — FUROSEMIDE 10 MG/ML IJ SOLN
40.0000 mg | Freq: Every day | INTRAMUSCULAR | Status: DC
  Administered 2017-06-14: 40 mg via INTRAVENOUS
  Filled 2017-06-13: qty 4

## 2017-06-13 MED ORDER — IBUPROFEN 200 MG PO TABS
600.0000 mg | ORAL_TABLET | Freq: Four times a day (QID) | ORAL | Status: DC | PRN
  Administered 2017-06-13 – 2017-06-14 (×2): 600 mg via ORAL
  Filled 2017-06-13 (×2): qty 3

## 2017-06-13 NOTE — Progress Notes (Signed)
Care RN in another room but was informed by NT that pt wanted to leave AMA. Asked NT to please send Charge RN to room since Care RN currently unavailable.

## 2017-06-13 NOTE — Progress Notes (Signed)
Patient ID: Anne Black, female   DOB: September 21, 1960, 57 y.o.   MRN: [id number] Patient presented to ultrasound department today for paracentesis.  On limited ultrasound of abdomen in all 4 quadrants there is no significant ascites present.  Procedure was canceled.  Patient notified.

## 2017-06-13 NOTE — Progress Notes (Signed)
Charge RN at bedside. 

## 2017-06-13 NOTE — Progress Notes (Signed)
Came out of another patient's room and noted Anne Black tearfully wondering the halls stating she couldn't find her room.  Assisted pt back to her room and patient assisted into bed.  Bed alarm had been turned off by another staff member.  Bed alarm turned back on at this time.

## 2017-06-13 NOTE — Progress Notes (Signed)
Charge RN states that patient is agitated because she does not have internet access in order to do some online banking that she needs to take care of, which is why she wants to leave AMA.  Charge RN explained concerns re: Pt's health if she were to leave AMA.  Charge RN assisted Pt in calling family members whom she asked to bring her a laptop or tablet she could use.  Pt in agreement to stay inpatient at this time.  Will page [facility] to discuss options if Pt's family members unable to bring her a laptop to use.

## 2017-06-13 NOTE — Progress Notes (Addendum)
PROGRESS NOTE    Anne Black  IOE:[id number] DOB: 08-08-1960 DOA: 06/12/2017 PCP: Forrest Moron, MD   Brief Narrative:  HPI On 06/12/2017 by Dr. Hosie Poisson Anne Black is a 57 y.o. female with medical history significant of ETOH dependence bipolar disorder, copd, liver cirrhosis, comes in for sob, productive cough for 3 to4 days now. She also reports nausea, vomiting and mild abdominal pain.  She denies any fever or chills, chest pain, palpitations, syncope. She is lethargic, but would open eyes to answer questions and doze off. She denies dysuria, headache, dizziness,. Lab work in ED revealed creatinine of 0.31, alk phos of 138 and bnp of 115, lactic acid of 1.94 and wbc count of 3.2 and hemoglobin of 11.4. She was also found to be hypoxic on arrival and required up to 2 lit of [place] oxygen, and was diffusely wheezing.  CT abd and pelvis and chest ruled out PE, showed ascites, liver cirrhosis, splenomegaly and stable varices.  She was referred to medical service for evaluation and management of COPD exacerbation.   Assessment & Plan   Acute respiratory failure with hypoxia/acute COPD exacerbation -Patient found to be hypoxic in the emergency department and placed on 2 L nasal cannula -Chest x-ray reviewed, Appears to be stable, unremarkable for infection -CTA chest: Unremarkable for PE or other acute chest findings.  Healing right-sided rib fractures. -patient placed on solumedrol 39m IV q6, however refuses to take it more than once per day- therefore will change dosage -Continue nebulizer treatments, currently on ceftriaxone, Dulera  Urinary tract infection -Complains of burning with urination -UA shows many bacteria, 0-5 WBC, positive nitrites -Urine culture pending  Alcoholic liver cirrhosis with ascites -CT abdomen pelvis: Progressive changes of hepatic cirrhosis with probable confluent hepatic fibrosis; hypervascular lesion in the left stable.  Findings consistent with mild  portal hypertension, splenomegaly, varices and increasing ascites -Paracentesis has been ordered -Continue spironolactone, Lasix -Will give additional IV dose of Lasix, this may also help her respiratory issues  Alcohol dependence with possible withdrawal -Alcohol level on admission was 245 -Continue CIWA protocol -Patient wishes to speak to social worker regarding rehab.  States she has tried calling many alcoholic rehab centers and has been declined  Abdominal wall wound -Had an umbilical hernia repair, continues to have abdominal wound -Wound care consulted and appreciated -Continue Rocephin  Pancytopenia -Secondary to liver cirrhosis -Currently stable, continue to monitor CBC  Bipolar disorder -Continue prozac  ADD -Continue vyvanse  Tobacco abuse -Discussed cessation, continued patch  Deconditioning -Expect secondary to patient's lifestyle including continued alcohol abuse, COPD, continued tobacco abuse -PT/OT consulted  DVT Prophylaxis  lovenox  Code Status: Full  Family Communication: none at bedside  Disposition Plan: Admitted, pending paracentesis  Consultants None  Procedures  None  Antibiotics   Anti-infectives (From admission, onward)   Start     Dose/Rate Route Frequency Ordered Stop   06/13/17 1000  cefTRIAXone (ROCEPHIN) 2 g in sodium chloride 0.9 % 100 mL IVPB     2 g 200 mL/hr over 30 Minutes Intravenous Every 24 hours 06/12/17 1804     06/13/17 0900  cefTRIAXone (ROCEPHIN) 1 g in sodium chloride 0.9 % 100 mL IVPB  Status:  Discontinued     1 g 200 mL/hr over 30 Minutes Intravenous Every 24 hours 06/12/17 1758 06/12/17 1804   06/12/17 2200  amoxicillin-clavulanate (AUGMENTIN) 875-125 MG per tablet 1 tablet  Status:  Discontinued     1 tablet Oral Every 12 hours  06/12/17 1758 06/12/17 1758   06/12/17 1100  cefTRIAXone (ROCEPHIN) 1 g in sodium chloride 0.9 % 100 mL IVPB     1 g 200 mL/hr over 30 Minutes Intravenous  Once 06/12/17 1051 06/12/17  1200      Subjective:   [name] seen and examined today.  Feels her breathing has mildly improved since coming to the hospital.  Denies current chest pain, abdominal pain, nausea or vomiting, diarrhea or constipation, dizziness or headache.  Complains about not having all of her medications ordered.  Would like to go to alcoholic rehab or skilled facility.  Would like to speak to Education officer, museum. Feels she is going to through withdrawal.  Objective:   Vitals:   06/12/17 2203 06/12/17 2208 06/13/17 0423 06/13/17 1252  BP:   123/62   Pulse:   84   Resp:   20   Temp:   98.1 F (36.7 C)   TempSrc:   Oral   SpO2: 98% 98% 90% 90%  Weight:      Height:        Intake/Output Summary (Last 24 hours) at 06/13/2017 1307 Last data filed at 06/13/2017 1215 Gross per 24 hour  Intake 120 ml  Output 1400 ml  Net -1280 ml   Filed Weights   06/12/17 1752  Weight: 79.3 kg (174 lb 13.2 oz)    Exam  General: Well developed, chronically ill appearing, NAD  HEENT: NCAT, mucous membranes moist.   Neck: Supple  Cardiovascular: S1 S2 auscultated, no rubs, murmurs or gallops. Regular rate and rhythm.  Respiratory: Clear to auscultation bilaterally with equal chest rise  Abdomen: Soft, nontender, nondistended, + bowel sounds  Extremities: warm dry without cyanosis clubbing or edema  Neuro: AAOx3,nonfoccal, mild tremor in the upper extremities  Psych: Appropriate mood and affect   Data Reviewed: I have personally reviewed following labs and imaging studies  CBC: Recent Labs  Lab 06/12/17 1005 06/13/17 0617  WBC 3.2* 2.8*  NEUTROABS 1.8  --   HGB 11.4* 11.0*  HCT 37.0 35.5*  MCV 87.3 86.8  PLT 107* 045*   Basic Metabolic Panel: Recent Labs  Lab 06/12/17 1005 06/13/17 0617  NA 139 136  K 3.7 4.1  CL 100* 98*  CO2 26 28  GLUCOSE 83 154*  BUN 5* 8  CREATININE 0.31* 0.36*  CALCIUM 8.0* 8.7*   GFR: Estimated Creatinine Clearance: 75.7 mL/min (A) (by C-G formula based  on SCr of 0.36 mg/dL (L)). Liver Function Tests: Recent Labs  Lab 06/12/17 1005  AST 39  ALT 22  ALKPHOS 138*  BILITOT 1.0  PROT 5.8*  ALBUMIN 3.3*   Recent Labs  Lab 06/12/17 1005  LIPASE 25   Recent Labs  Lab 06/13/17 0617  AMMONIA 62*   Coagulation Profile: No results for input(s): INR, PROTIME in the last 168 hours. Cardiac Enzymes: No results for input(s): CKTOTAL, CKMB, CKMBINDEX, TROPONINI in the last 168 hours. BNP (last 3 results) No results for input(s): PROBNP in the last 8760 hours. HbA1C: No results for input(s): HGBA1C in the last 72 hours. CBG: No results for input(s): GLUCAP in the last 168 hours. Lipid Profile: No results for input(s): CHOL, HDL, LDLCALC, TRIG, CHOLHDL, LDLDIRECT in the last 72 hours. Thyroid Function Tests: No results for input(s): TSH, T4TOTAL, FREET4, T3FREE, THYROIDAB in the last 72 hours. Anemia Panel: No results for input(s): VITAMINB12, FOLATE, FERRITIN, TIBC, IRON, RETICCTPCT in the last 72 hours. Urine analysis:    Component Value Date/Time  COLORURINE YELLOW 06/12/2017 1005   APPEARANCEUR CLEAR 06/12/2017 1005   LABSPEC 1.004 (L) 06/12/2017 1005   PHURINE 6.0 06/12/2017 1005   GLUCOSEU NEGATIVE 06/12/2017 1005   HGBUR SMALL (A) 06/12/2017 1005   BILIRUBINUR NEGATIVE 06/12/2017 1005   BILIRUBINUR moderate (A) 06/19/2015 1906   KETONESUR NEGATIVE 06/12/2017 1005   PROTEINUR NEGATIVE 06/12/2017 1005   UROBILINOGEN >=8.0 06/19/2015 1906   UROBILINOGEN 0.2 09/14/2011 2141   NITRITE POSITIVE (A) 06/12/2017 1005   LEUKOCYTESUR NEGATIVE 06/12/2017 1005   Sepsis Labs: '@LABRCNTIP' (procalcitonin:4,lacticidven:4)  )No results found for this or any previous visit (from the past 240 hour(s)).    Radiology Studies: Dg Chest 2 View  Result Date: 06/12/2017 CLINICAL DATA:  Cough and dyspnea EXAM: CHEST - 2 VIEW COMPARISON:  05/30/2017 chest radiograph. FINDINGS: Stable cardiomediastinal silhouette with mild cardiomegaly. No  pneumothorax. No pleural effusion. Lungs appear clear, with no acute consolidative airspace disease and no pulmonary edema. IMPRESSION: Stable mild cardiomegaly without pulmonary edema. No active pulmonary disease. Electronically Signed   By: Ilona Sorrel M.D.   On: 06/12/2017 10:39   Ct Angio Chest Pe W And/or Wo Contrast  Result Date: 06/12/2017 CLINICAL DATA:  Chest and abdominal pain with nausea. Atrial fibrillation. Abdominal surgical wound. Clinical suspicion of pulmonary embolism. EXAM: CT ANGIOGRAPHY CHEST CT ABDOMEN AND PELVIS WITH CONTRAST TECHNIQUE: Multidetector CT imaging of the chest was performed using the standard protocol during bolus administration of intravenous contrast. Multiplanar CT image reconstructions and MIPs were obtained to evaluate the vascular anatomy. Multidetector CT imaging of the abdomen and pelvis was performed using the standard protocol during bolus administration of intravenous contrast. CONTRAST:  123m ISOVUE-370 IOPAMIDOL (ISOVUE-370) INJECTION 76% COMPARISON:  Chest CT 01/18/2017.  Abdominopelvic CT 05/06/2017. FINDINGS: CTA CHEST FINDINGS Cardiovascular: The pulmonary arteries are well opacified with contrast to the level of the subsegmental branches. There is no evidence of acute pulmonary embolism. Minimal aortic atherosclerosis without acute systemic arterial abnormalities. The heart size is normal. There is no pericardial effusion. Mediastinum/Nodes: There are no enlarged mediastinal, hilar or axillary lymph nodes. The thyroid gland, trachea and esophagus demonstrate no significant findings. Lungs/Pleura: There is no pleural effusion. The lungs are clear. Musculoskeletal/Chest wall: There is no chest wall mass or suspicious osseous finding. Bilateral subpectoral breast implants. Several old rib fractures on the right, incompletely healed. Review of the MIP images confirms the above findings. CT ABDOMEN and PELVIS FINDINGS Hepatobiliary: Changes of hepatic cirrhosis  are again noted. There is progressive capsular retraction, volume loss and ill-defined increased density anteriorly in the right hepatic lobe. Ill-defined hyperdense 18 mm lesion in the left lobe on image 19/2 is stable. No evidence of gallstones, gallbladder wall thickening or biliary dilatation. Pancreas: Unremarkable. No pancreatic ductal dilatation or surrounding inflammatory changes. Spleen: Spleen size is stable at the upper limits of normal. No focal abnormality. Adrenals/Urinary Tract: Both adrenal glands appear normal. Both kidneys appear normal without evidence of urinary tract calculus, renal mass hydronephrosis. The bladder appears normal. Stomach/Bowel: No enteric contrast was administered. The stomach and right colon are incompletely distended. Allowing for this, no apparent bowel wall thickening or surrounding inflammation. The appendix appears normal. Vascular/Lymphatic: Several prominent lymph nodes in the porta hepatis and gastrohepatic ligament are similar to the previous study, likely reactive and related to the patient's cirrhosis. There is no retroperitoneal lymphadenopathy. There is mild aortic and branch vessel atherosclerosis. Small gastric and distal esophageal varices. Recanalized left paraumbilical vein. Stable mild dilatation of the left ovarian vein. Reproductive: The  uterus and ovaries appear normal. No evidence of adnexal mass. Other: Stable postsurgical changes in the periumbilical anterior abdominal wall with skin thickening and increased density in the underlying subcutaneous fat. There is no focal fluid collection or hernia. There is a small amount of ascites around the liver and in the pelvis, increased from the prior study. Musculoskeletal: No acute osseous findings. Chronic sclerosis of the left femoral head without collapse, consistent with mild chronic avascular necrosis. Right iliopsoas bursal fluid collection appears stable. IMPRESSION: 1. No evidence of acute pulmonary  embolism or other acute chest findings. 2. Healing right-sided rib fractures. 3. Progressive changes of hepatic cirrhosis with probable confluent hepatic fibrosis. Hypervascular lesion in the left lobe is stable. 4. Stable findings consistent with mild portal hypertension with borderline splenomegaly, varices and increasing ascites. 5.  Aortic Atherosclerosis (ICD10-I70.0). Electronically Signed   By: Richardean Sale M.D.   On: 06/12/2017 12:26   Ct Abdomen Pelvis W Contrast  Result Date: 06/12/2017 CLINICAL DATA:  Chest and abdominal pain with nausea. Atrial fibrillation. Abdominal surgical wound. Clinical suspicion of pulmonary embolism. EXAM: CT ANGIOGRAPHY CHEST CT ABDOMEN AND PELVIS WITH CONTRAST TECHNIQUE: Multidetector CT imaging of the chest was performed using the standard protocol during bolus administration of intravenous contrast. Multiplanar CT image reconstructions and MIPs were obtained to evaluate the vascular anatomy. Multidetector CT imaging of the abdomen and pelvis was performed using the standard protocol during bolus administration of intravenous contrast. CONTRAST:  187m ISOVUE-370 IOPAMIDOL (ISOVUE-370) INJECTION 76% COMPARISON:  Chest CT 01/18/2017.  Abdominopelvic CT 05/06/2017. FINDINGS: CTA CHEST FINDINGS Cardiovascular: The pulmonary arteries are well opacified with contrast to the level of the subsegmental branches. There is no evidence of acute pulmonary embolism. Minimal aortic atherosclerosis without acute systemic arterial abnormalities. The heart size is normal. There is no pericardial effusion. Mediastinum/Nodes: There are no enlarged mediastinal, hilar or axillary lymph nodes. The thyroid gland, trachea and esophagus demonstrate no significant findings. Lungs/Pleura: There is no pleural effusion. The lungs are clear. Musculoskeletal/Chest wall: There is no chest wall mass or suspicious osseous finding. Bilateral subpectoral breast implants. Several old rib fractures on the  right, incompletely healed. Review of the MIP images confirms the above findings. CT ABDOMEN and PELVIS FINDINGS Hepatobiliary: Changes of hepatic cirrhosis are again noted. There is progressive capsular retraction, volume loss and ill-defined increased density anteriorly in the right hepatic lobe. Ill-defined hyperdense 18 mm lesion in the left lobe on image 19/2 is stable. No evidence of gallstones, gallbladder wall thickening or biliary dilatation. Pancreas: Unremarkable. No pancreatic ductal dilatation or surrounding inflammatory changes. Spleen: Spleen size is stable at the upper limits of normal. No focal abnormality. Adrenals/Urinary Tract: Both adrenal glands appear normal. Both kidneys appear normal without evidence of urinary tract calculus, renal mass hydronephrosis. The bladder appears normal. Stomach/Bowel: No enteric contrast was administered. The stomach and right colon are incompletely distended. Allowing for this, no apparent bowel wall thickening or surrounding inflammation. The appendix appears normal. Vascular/Lymphatic: Several prominent lymph nodes in the porta hepatis and gastrohepatic ligament are similar to the previous study, likely reactive and related to the patient's cirrhosis. There is no retroperitoneal lymphadenopathy. There is mild aortic and branch vessel atherosclerosis. Small gastric and distal esophageal varices. Recanalized left paraumbilical vein. Stable mild dilatation of the left ovarian vein. Reproductive: The uterus and ovaries appear normal. No evidence of adnexal mass. Other: Stable postsurgical changes in the periumbilical anterior abdominal wall with skin thickening and increased density in the underlying subcutaneous fat. There  is no focal fluid collection or hernia. There is a small amount of ascites around the liver and in the pelvis, increased from the prior study. Musculoskeletal: No acute osseous findings. Chronic sclerosis of the left femoral head without  collapse, consistent with mild chronic avascular necrosis. Right iliopsoas bursal fluid collection appears stable. IMPRESSION: 1. No evidence of acute pulmonary embolism or other acute chest findings. 2. Healing right-sided rib fractures. 3. Progressive changes of hepatic cirrhosis with probable confluent hepatic fibrosis. Hypervascular lesion in the left lobe is stable. 4. Stable findings consistent with mild portal hypertension with borderline splenomegaly, varices and increasing ascites. 5.  Aortic Atherosclerosis (ICD10-I70.0). Electronically Signed   By: Richardean Sale M.D.   On: 06/12/2017 12:26     Scheduled Meds: . enoxaparin (LOVENOX) injection  40 mg Subcutaneous Q24H  . FLUoxetine  20 mg Oral Daily  . folic acid  1 mg Oral Daily  . furosemide  40 mg Oral Daily  . gabapentin  100 mg Oral TID  . ipratropium-albuterol  3 mL Nebulization QID  . lisdexamfetamine  50 mg Oral Daily   And  . lisdexamfetamine  20 mg Oral Daily  . methylPREDNISolone (SOLU-MEDROL) injection  60 mg Intravenous Q6H  . modafinil  200 mg Oral 2 times per day  . mometasone-formoterol  2 puff Inhalation BID  . multivitamin with minerals  1 tablet Oral Daily  . nicotine  21 mg Transdermal Daily  . pantoprazole  40 mg Oral Daily  . risperiDONE  0.5 mg Oral BID  . spironolactone  100 mg Oral Daily  . thiamine  100 mg Oral Daily   Or  . thiamine  100 mg Intravenous Daily  . topiramate  100 mg Oral Daily   Continuous Infusions: . cefTRIAXone (ROCEPHIN)  IV Stopped (06/13/17 1041)     LOS: 0 days   Time Spent in minutes   45 minutes  Maricel Swartzendruber D.O. on 06/13/2017 at 1:07 PM  Between 7am to 7pm - Pager - [phone]  After 7pm go to [email or web address] - password [email or web address]  And look for the night coverage person covering for me after hours  [facility] Office  [phone]

## 2017-06-13 NOTE — Consult Note (Signed)
WOC Nurse wound consult note Reason for Consult: Healing surgical incision, just need to complete the re-epithelialization process. There was no dressing to the area at the time of my assessment on 5E 1521.  The wound measures 6.3 cm x 2.4 cm x 0 depth.  There area is without drainage, odor, erythema and has normal color and texture surrounding tissue.  Plan of care is:  Wash the area with No rinse cleanser, followed by warm soapy water.  Pat dry.  Apply a [employer] hydrocolloid.  Change every 5 days. Monitor the wound area(s) for worsening of condition such as: Signs/symptoms of infection,  Increase in size,  Development of or worsening of odor, Development of pain, or increased pain at the affected locations.  Notify the medical team if any of these develop.  Thank you for the consult.  Discussed plan of care with the patient and bedside nurse.  WOC nurse will not follow at this time.  Please re-consult the WOC team if needed.  Helmut Muster, RN, MSN, CWOCN, CNS-BC, pager [phone]

## 2017-06-13 NOTE — Care Management Note (Signed)
Case Management Note  Patient Details  Name: TAMIRAH JANET MRN: [id number] Date of Birth: 04/09/1960  Subjective/Objective:                  02 needs  Action/Plan: Requested floor staff get o2 perimeters for home o2.  Expected Discharge Date:  06/14/17               Expected Discharge Plan:     In-House Referral:     Discharge planning Services     Post Acute Care Choice:    Choice offered to:     DME Arranged:    DME Agency:     HH Arranged:    HH Agency:     Status of Service:     If discussed at [employer], dates discussed:    Additional Comments:  Golda Acre, RN 06/13/2017, 1:10 PM

## 2017-06-14 LAB — CBC
HEMATOCRIT: 36.5 % (ref 36.0–46.0)
HEMOGLOBIN: 11.4 g/dL — AB (ref 12.0–15.0)
MCH: 27 pg (ref 26.0–34.0)
MCHC: 31.2 g/dL (ref 30.0–36.0)
MCV: 86.3 fL (ref 78.0–100.0)
Platelets: 136 10*3/uL — ABNORMAL LOW (ref 150–400)
RBC: 4.23 MIL/uL (ref 3.87–5.11)
RDW: 18.8 % — ABNORMAL HIGH (ref 11.5–15.5)
WBC: 6.9 10*3/uL (ref 4.0–10.5)

## 2017-06-14 LAB — BASIC METABOLIC PANEL
ANION GAP: 11 (ref 5–15)
BUN: 10 mg/dL (ref 6–20)
CHLORIDE: 97 mmol/L — AB (ref 101–111)
CO2: 26 mmol/L (ref 22–32)
Calcium: 9.1 mg/dL (ref 8.9–10.3)
Creatinine, Ser: 0.58 mg/dL (ref 0.44–1.00)
GFR calc non Af Amer: >60 mL/min (ref >60)
GLUCOSE: 103 mg/dL — AB (ref 65–99)
POTASSIUM: 3 mmol/L — AB (ref 3.5–5.1)
Sodium: 134 mmol/L — ABNORMAL LOW (ref 135–145)

## 2017-06-14 LAB — URINE CULTURE

## 2017-06-14 MED ORDER — POTASSIUM CHLORIDE CRYS ER 10 MEQ PO TBCR: 40.0000 meq | EXTENDED_RELEASE_TABLET | Freq: Every day | ORAL | Status: DC

## 2017-06-14 MED ORDER — GABAPENTIN 100 MG PO CAPS
200.0000 mg | ORAL_CAPSULE | Freq: Once | ORAL | Status: AC
  Administered 2017-06-14: 200 mg via ORAL
  Filled 2017-06-14: qty 2

## 2017-06-14 MED ORDER — GABAPENTIN 100 MG PO CAPS: 100.0000 mg | ORAL_CAPSULE | Freq: Three times a day (TID) | ORAL | Status: DC

## 2017-06-14 MED ORDER — GABAPENTIN 300 MG PO CAPS: 300.0000 mg | ORAL_CAPSULE | Freq: Three times a day (TID) | ORAL | Status: DC

## 2017-06-14 MED ORDER — POTASSIUM CHLORIDE CRYS ER 10 MEQ PO TBCR
40.0000 meq | EXTENDED_RELEASE_TABLET | Freq: Two times a day (BID) | ORAL | Status: DC
  Administered 2017-06-14: 40 meq via ORAL
  Filled 2017-06-14: qty 4

## 2017-06-14 MED ORDER — IPRATROPIUM-ALBUTEROL 0.5-2.5 (3) MG/3ML IN SOLN
3.0000 mL | Freq: Three times a day (TID) | RESPIRATORY_TRACT | Status: DC
  Administered 2017-06-14: 3 mL via RESPIRATORY_TRACT
  Filled 2017-06-14: qty 3

## 2017-06-14 MED ORDER — PREDNISONE 50 MG PO TABS
60.0000 mg | ORAL_TABLET | Freq: Every day | ORAL | Status: DC
  Filled 2017-06-14: qty 1

## 2017-06-14 NOTE — Progress Notes (Signed)
Patient refused to have PIV placed. MD notified via text page.

## 2017-06-14 NOTE — Progress Notes (Signed)
Paged and spoke with on call to make him aware that he may receive a call from [facility].

## 2017-06-14 NOTE — Progress Notes (Signed)
Explained to patient that Neurontin was verified with doctor that she paged at [facility].  Pt is indeed taking 100mg  po TID.  Patient continues to insist that it is the wrong dose.  Patient states she is going to page [facility] MD again.  Tried to explain to patient that medication has already been verified with her [facility] records.  Patient continues to state that she takes Neurontin 300mg  po TID AND PRN.  Called Charge RN and [facility] to come and speak to patient.

## 2017-06-14 NOTE — Progress Notes (Signed)
LCSW consulted for living arrangement and SNF placement.   Patient was assessed on 5/5 by CSW dept. Patient does not have a skilled need at this time and does not qualify for SNF. This was explained to patient on 5/5. Per RN note on 5/7, patient was ambulating in the hallway. Patient has resources and is not willing to private pay for SNF stay.    LCSW signing off. No CSW needs at this time.   Beulah Gandy [place] CSW [phone]

## 2017-06-14 NOTE — Progress Notes (Signed)
Patient extremely anxious. States that she only took 100mg  of Neurontin tonight but that she takes 300mg  at home.  Have discussed this previously with patient multiple times.  Reviewed with patient again that per her admission record  PTA medication list, she indicated that she takes 100mg  of Neurontin three times daily. Patient states that it is wrong.  Patient insists on talking to her Neurology Specialist at [facility].  Explained to patient that it will be difficult to speak directly to her Neurology Specialist to verify her medication given that it is the middle of the night, but that her [facility] attending physician can follow up with her [facility] specialist in the morning.  Patient continues to insist that her medication be changed.  Tried to explain to patient again that the medication in question will be reviewed by her [facility] attending physician during morning rounds. Patient then proceeded to call the hospital operator to look up and patch her through to the Neurologist on call at [facility] so that she can verify her medication.

## 2017-06-14 NOTE — Progress Notes (Signed)
Patient states that she has spoken with someone at [facility] who is supposed to be paging her doctor there so that she can get her dose of Neurontin changed.  Patient states that they will be calling her back shortly.  Patient requesting that RN remain in room until they call back.  Explained to patient that RN is not able to remain at her bedside awaiting a page to be returned.  However, when they called back, she was welcome to press the call button to let the RN know.

## 2017-06-14 NOTE — Discharge Summary (Signed)
Physician Discharge Summary  Anne Black FGH:[id number] DOB: 16-Nov-1960 DOA: 06/12/2017  PCP: Anne Moron, MD  Admit date: 06/12/2017 Discharge date: 06/14/2017  Time spent: 45 minutes  Recommendations for Outpatient Follow-up:  None, patient left AGAINST MEDICAL ADVICE  Discharge Diagnoses:  Acute respiratory failure with hypoxia/acute COPD exacerbation Urinary tract infection Alcoholic liver cirrhosis with ascites Alcohol dependence with possible withdrawal Abdominal wall wound Pancytopenia Bipolar disorder ADD Tobacco abuse Deconditioning  Discharge Condition:    Diet recommendation:    Filed Weights   06/12/17 1752  Weight: 79.3 kg (174 lb 13.2 oz)    History of present illness:  On 06/12/2017 by Dr. Fredrik Rigger [name] a 57 y.o.femalewith medical history significant ofETOH dependence bipolar disorder, copd, liver cirrhosis, comes in for sob, productive cough for 3 to4 days now. She also reports nausea, vomiting and mild abdominal pain.  She denies any fever or chills, chest pain, palpitations, syncope. She is lethargic, but would open eyes to answer questions and doze off. She denies dysuria, headache, dizziness,. Lab work in ED revealed creatinine of 0.31, alk phos of 138 and bnp of 115, lactic acid of 1.94 and wbc count of 3.2 and hemoglobin of 11.4. She was also found to be hypoxic on arrival and required up to 2 lit of [place] oxygen, and was diffusely wheezing.  CT abd and pelvis and chest ruled out PE, showed ascites, liver cirrhosis, splenomegaly and stable varices.  She was referred to medical service for evaluation and management of COPD exacerbation.  Hospital Course:  Patient was admitted for acute respiratory failure and COPD exacerbation.  She opted to leave AMA prior to improvement.  Acute respiratory failure with hypoxia/acute COPD exacerbation -Patient found to be hypoxic in the emergency department and placed on 2 L nasal cannula -Chest  x-ray reviewed, Appears to be stable, unremarkable for infection -CTA chest: Unremarkable for PE or other acute chest findings.  Healing right-sided rib fractures. -patient placed on solumedrol 16m IV q6, however refuses to take it more than once per day- therefore will change dosage -was placed on nebulizer treatments, currently on ceftriaxone, Dulera  Urinary tract infection -Complains of burning with urination -UA shows many bacteria, 0-5 WBC, positive nitrites -Urine culture pending -was placed on ceftraixone  Alcoholic liver cirrhosis with ascites -CT abdomen pelvis: Progressive changes of hepatic cirrhosis with probable confluent hepatic fibrosis; hypervascular lesion in the left stable.  Findings consistent with mild portal hypertension, splenomegaly, varices and increasing ascites -Paracentesis has been ordered- however no significant ascites noted on ultrasound the abdomen -Continue spironolactone, Lasix  Alcohol dependence with possible withdrawal -Alcohol level on admission was 245 -Continue CIWA protocol -Patient wishes to speak to social worker regarding rehab.  States she has tried calling many alcoholic rehab centers and has been declined  Abdominal wall wound -Had an umbilical hernia repair, continues to have abdominal wound -Wound care consulted and appreciated -was placed on Rocephin  Pancytopenia -Secondary to liver cirrhosis  Bipolar disorder -Continue prozac  ADD -Continue vyvanse  Tobacco abuse -Discussed cessation, continued patch  Deconditioning -Expect secondary to patient's lifestyle including continued alcohol abuse, COPD, continued tobacco abuse -PT/OT consulted  Procedures: Abd [place] Consultations: IR  Discharge Exam: Vitals:   06/14/17 0459 06/14/17 1005  BP: (!) 108/56   Pulse: 87   Resp:    Temp: 97.8 F (36.6 C)   SpO2: 100% 93%   Patient left AMA  Discharge Instructions None  No Known Allergies  The results  of significant diagnostics from this hospitalization (including imaging, microbiology, ancillary and laboratory) are listed below for reference.    Significant Diagnostic Studies: Dg Chest 2 View  Result Date: 06/12/2017 CLINICAL DATA:  Cough and dyspnea EXAM: CHEST - 2 VIEW COMPARISON:  05/30/2017 chest radiograph. FINDINGS: Stable cardiomediastinal silhouette with mild cardiomegaly. No pneumothorax. No pleural effusion. Lungs appear clear, with no acute consolidative airspace disease and no pulmonary edema. IMPRESSION: Stable mild cardiomegaly without pulmonary edema. No active pulmonary disease. Electronically Signed   By: Ilona Sorrel M.D.   On: 06/12/2017 10:39   Dg Chest 2 View  Result Date: 05/30/2017 CLINICAL DATA:  Right-sided chest pain associated with shortness of breath, nausea, dizziness, and tachycardia. History of COPD, cirrhosis, multiple scleroses, current smoker. EXAM: CHEST - 2 VIEW COMPARISON:  Chest x-ray of May 06, 2017 FINDINGS: The lungs are adequately inflated. There is no focal infiltrate. There is no pleural effusion or pneumothorax. The cardiac silhouette is mildly enlarged. The pulmonary vascularity is normal. There is calcification in the wall of the aortic arch. The trachea is midline. The bony thorax exhibits no acute abnormality. There is multilevel degenerative disc disease of the thoracic spine. IMPRESSION: Mild cardiomegaly without pulmonary edema. Mild chronic bronchitic-smoking related changes. Electronically Signed   By: David  Martinique M.D.   On: 05/30/2017 11:42   Ct Angio Chest Pe W And/or Wo Contrast  Result Date: 06/12/2017 CLINICAL DATA:  Chest and abdominal pain with nausea. Atrial fibrillation. Abdominal surgical wound. Clinical suspicion of pulmonary embolism. EXAM: CT ANGIOGRAPHY CHEST CT ABDOMEN AND PELVIS WITH CONTRAST TECHNIQUE: Multidetector CT imaging of the chest was performed using the standard protocol during bolus administration of intravenous  contrast. Multiplanar CT image reconstructions and MIPs were obtained to evaluate the vascular anatomy. Multidetector CT imaging of the abdomen and pelvis was performed using the standard protocol during bolus administration of intravenous contrast. CONTRAST:  125m ISOVUE-370 IOPAMIDOL (ISOVUE-370) INJECTION 76% COMPARISON:  Chest CT 01/18/2017.  Abdominopelvic CT 05/06/2017. FINDINGS: CTA CHEST FINDINGS Cardiovascular: The pulmonary arteries are well opacified with contrast to the level of the subsegmental branches. There is no evidence of acute pulmonary embolism. Minimal aortic atherosclerosis without acute systemic arterial abnormalities. The heart size is normal. There is no pericardial effusion. Mediastinum/Nodes: There are no enlarged mediastinal, hilar or axillary lymph nodes. The thyroid gland, trachea and esophagus demonstrate no significant findings. Lungs/Pleura: There is no pleural effusion. The lungs are clear. Musculoskeletal/Chest wall: There is no chest wall mass or suspicious osseous finding. Bilateral subpectoral breast implants. Several old rib fractures on the right, incompletely healed. Review of the MIP images confirms the above findings. CT ABDOMEN and PELVIS FINDINGS Hepatobiliary: Changes of hepatic cirrhosis are again noted. There is progressive capsular retraction, volume loss and ill-defined increased density anteriorly in the right hepatic lobe. Ill-defined hyperdense 18 mm lesion in the left lobe on image 19/2 is stable. No evidence of gallstones, gallbladder wall thickening or biliary dilatation. Pancreas: Unremarkable. No pancreatic ductal dilatation or surrounding inflammatory changes. Spleen: Spleen size is stable at the upper limits of normal. No focal abnormality. Adrenals/Urinary Tract: Both adrenal glands appear normal. Both kidneys appear normal without evidence of urinary tract calculus, renal mass hydronephrosis. The bladder appears normal. Stomach/Bowel: No enteric  contrast was administered. The stomach and right colon are incompletely distended. Allowing for this, no apparent bowel wall thickening or surrounding inflammation. The appendix appears normal. Vascular/Lymphatic: Several prominent lymph nodes in the porta hepatis and gastrohepatic ligament are  similar to the previous study, likely reactive and related to the patient's cirrhosis. There is no retroperitoneal lymphadenopathy. There is mild aortic and branch vessel atherosclerosis. Small gastric and distal esophageal varices. Recanalized left paraumbilical vein. Stable mild dilatation of the left ovarian vein. Reproductive: The uterus and ovaries appear normal. No evidence of adnexal mass. Other: Stable postsurgical changes in the periumbilical anterior abdominal wall with skin thickening and increased density in the underlying subcutaneous fat. There is no focal fluid collection or hernia. There is a small amount of ascites around the liver and in the pelvis, increased from the prior study. Musculoskeletal: No acute osseous findings. Chronic sclerosis of the left femoral head without collapse, consistent with mild chronic avascular necrosis. Right iliopsoas bursal fluid collection appears stable. IMPRESSION: 1. No evidence of acute pulmonary embolism or other acute chest findings. 2. Healing right-sided rib fractures. 3. Progressive changes of hepatic cirrhosis with probable confluent hepatic fibrosis. Hypervascular lesion in the left lobe is stable. 4. Stable findings consistent with mild portal hypertension with borderline splenomegaly, varices and increasing ascites. 5.  Aortic Atherosclerosis (ICD10-I70.0). Electronically Signed   By: Richardean Sale M.D.   On: 06/12/2017 12:26   Ct Abdomen Pelvis W Contrast  Result Date: 06/12/2017 CLINICAL DATA:  Chest and abdominal pain with nausea. Atrial fibrillation. Abdominal surgical wound. Clinical suspicion of pulmonary embolism. EXAM: CT ANGIOGRAPHY CHEST CT ABDOMEN  AND PELVIS WITH CONTRAST TECHNIQUE: Multidetector CT imaging of the chest was performed using the standard protocol during bolus administration of intravenous contrast. Multiplanar CT image reconstructions and MIPs were obtained to evaluate the vascular anatomy. Multidetector CT imaging of the abdomen and pelvis was performed using the standard protocol during bolus administration of intravenous contrast. CONTRAST:  181m ISOVUE-370 IOPAMIDOL (ISOVUE-370) INJECTION 76% COMPARISON:  Chest CT 01/18/2017.  Abdominopelvic CT 05/06/2017. FINDINGS: CTA CHEST FINDINGS Cardiovascular: The pulmonary arteries are well opacified with contrast to the level of the subsegmental branches. There is no evidence of acute pulmonary embolism. Minimal aortic atherosclerosis without acute systemic arterial abnormalities. The heart size is normal. There is no pericardial effusion. Mediastinum/Nodes: There are no enlarged mediastinal, hilar or axillary lymph nodes. The thyroid gland, trachea and esophagus demonstrate no significant findings. Lungs/Pleura: There is no pleural effusion. The lungs are clear. Musculoskeletal/Chest wall: There is no chest wall mass or suspicious osseous finding. Bilateral subpectoral breast implants. Several old rib fractures on the right, incompletely healed. Review of the MIP images confirms the above findings. CT ABDOMEN and PELVIS FINDINGS Hepatobiliary: Changes of hepatic cirrhosis are again noted. There is progressive capsular retraction, volume loss and ill-defined increased density anteriorly in the right hepatic lobe. Ill-defined hyperdense 18 mm lesion in the left lobe on image 19/2 is stable. No evidence of gallstones, gallbladder wall thickening or biliary dilatation. Pancreas: Unremarkable. No pancreatic ductal dilatation or surrounding inflammatory changes. Spleen: Spleen size is stable at the upper limits of normal. No focal abnormality. Adrenals/Urinary Tract: Both adrenal glands appear normal.  Both kidneys appear normal without evidence of urinary tract calculus, renal mass hydronephrosis. The bladder appears normal. Stomach/Bowel: No enteric contrast was administered. The stomach and right colon are incompletely distended. Allowing for this, no apparent bowel wall thickening or surrounding inflammation. The appendix appears normal. Vascular/Lymphatic: Several prominent lymph nodes in the porta hepatis and gastrohepatic ligament are similar to the previous study, likely reactive and related to the patient's cirrhosis. There is no retroperitoneal lymphadenopathy. There is mild aortic and branch vessel atherosclerosis. Small gastric and distal esophageal varices.  Recanalized left paraumbilical vein. Stable mild dilatation of the left ovarian vein. Reproductive: The uterus and ovaries appear normal. No evidence of adnexal mass. Other: Stable postsurgical changes in the periumbilical anterior abdominal wall with skin thickening and increased density in the underlying subcutaneous fat. There is no focal fluid collection or hernia. There is a small amount of ascites around the liver and in the pelvis, increased from the prior study. Musculoskeletal: No acute osseous findings. Chronic sclerosis of the left femoral head without collapse, consistent with mild chronic avascular necrosis. Right iliopsoas bursal fluid collection appears stable. IMPRESSION: 1. No evidence of acute pulmonary embolism or other acute chest findings. 2. Healing right-sided rib fractures. 3. Progressive changes of hepatic cirrhosis with probable confluent hepatic fibrosis. Hypervascular lesion in the left lobe is stable. 4. Stable findings consistent with mild portal hypertension with borderline splenomegaly, varices and increasing ascites. 5.  Aortic Atherosclerosis (ICD10-I70.0). Electronically Signed   By: Richardean Sale M.D.   On: 06/12/2017 12:26   US Abdomen Limited  Result Date: 06/13/2017 CLINICAL DATA:  Ascites EXAM: LIMITED  ABDOMEN ULTRASOUND FOR ASCITES TECHNIQUE: Limited ultrasound survey for ascites was performed in all four abdominal quadrants. COMPARISON:  CT abdomen pelvis 06/12/2017 FINDINGS: No significant ascites identified. Perihepatic ascites seen on the recent CT exam not visualized. IMPRESSION: No significant ascites identified. Electronically Signed   By: Lavonia Dana M.D.   On: 06/13/2017 16:38    Microbiology: No results found for this or any previous visit (from the past 240 hour(s)).   Labs: Basic Metabolic Panel: Recent Labs  Lab 06/12/17 1005 06/13/17 0617 06/14/17 0628  NA 139 136 134*  K 3.7 4.1 3.0*  CL 100* 98* 97*  CO2 _0 GLUCOSE 83 154* 103*  BUN 5* 8 10  CREATININE 0.31* 0.36* 0.58  CALCIUM 8.0* 8.7* 9.1   Liver Function Tests: Recent Labs  Lab 06/12/17 1005  AST 39  ALT 22  ALKPHOS 138*  BILITOT 1.0  PROT 5.8*  ALBUMIN 3.3*   Recent Labs  Lab 06/12/17 1005  LIPASE 25   Recent Labs  Lab 06/13/17 0617  AMMONIA 62*   CBC: Recent Labs  Lab 06/12/17 1005 06/13/17 0617 06/14/17 0628  WBC 3.2* 2.8* 6.9  NEUTROABS 1.8  --   --   HGB 11.4* 11.0* 11.4*  HCT 37.0 35.5* 36.5  MCV 87.3 86.8 86.3  PLT 107* 123* 136*   Cardiac Enzymes: No results for input(s): CKTOTAL, CKMB, CKMBINDEX, TROPONINI in the last 168 hours. BNP: BNP (last 3 results) Recent Labs    12/09/16 0041 06/12/17 1005  BNP 63.7 115.0*    ProBNP (last 3 results) No results for input(s): PROBNP in the last 8760 hours.  CBG: No results for input(s): GLUCAP in the last 168 hours.     Signed:  Cristal Ford  [facility] 06/14/2017, 12:56 PM

## 2017-06-14 NOTE — Progress Notes (Signed)
Patient refusing to stay and wait for PT evaluation.  Patient leaving AMA.  AMA form signed and MD notified via text page.

## 2017-06-14 NOTE — Progress Notes (Signed)
Received a phone call from Dr. Lupita Raider at [facility] regarding patient's page about her Neurontin.  Patient continuing to complain that medication is not dosed correctly.  Per Dr. Lupita Raider, patient last seen at [facility] September 2018 and dosage noted for Neurontin is 100mg  po TID.  Reviewed again to make certain.  Neurontin 100mg  TID is listed in her last visit note.

## 2017-06-14 NOTE — Evaluation (Signed)
Occupational Therapy Evaluation Patient Details Name: Anne Black MRN: [id number] DOB: 1961-02-07 Today's Date: 06/14/2017    History of Present Illness Anne Black is a 57 y.o. female with medical history significant of ETOH dependence bipolar disorder, copd, liver cirrhosis, comes in for sob, productive cough for 3 to4 days now. She also reports nausea, vomiting and mild abdominal pain.    Clinical Impression   Pt with decreased safety awareness.  Pt plans to leave AMA.  Educated pt on fall prevention and risk of falling based upon OT's observations of pt.      Follow Up Recommendations  Supervision/Assistance - 24 hour    Equipment Recommendations  None recommended by OT    Recommendations for Other Services       Precautions / Restrictions Precautions Precautions: Fall      Mobility Bed Mobility               General bed mobility comments: Pt OOB  Transfers Overall transfer level: Needs assistance Equipment used: None Transfers: Sit to/from [employer] Transfers Sit to Stand: Min guard Stand pivot transfers: Min guard       General transfer comment: Pt VERY unsteady.  Educated pt risk of falling as pt carrying 2 large bags and walking.      Balance                                           ADL either performed or assessed with clinical judgement   ADL Overall ADL's : Needs assistance/impaired Eating/Feeding: Set up;Sitting   Grooming: Standing;Min guard   Upper Body Bathing: Set up;Sitting   Lower Body Bathing: Min guard;Sit to/from stand   Upper Body Dressing : Set up;Sitting   Lower Body Dressing: Minimal assistance;Sit to/from stand;Cueing for sequencing;Cueing for safety   Toilet Transfer: Min guard;Comfort height toilet;Ambulation   Toileting- Clothing Manipulation and Hygiene: Min guard;Sit to/from stand       Functional mobility during ADLs: Minimal assistance;Rolling walker;Cueing for safety;Cueing for  sequencing General ADL Comments: Pt planning to leave AMA.  Pt walking down the hall very unsteady. Educated pt on risk of falling.  Pt with decreased safety awareness.     Vision Patient Visual Report: No change from baseline              Pertinent Vitals/Pain Pain Assessment: No/denies pain     Hand Dominance     Extremity/Trunk Assessment Upper Extremity Assessment Upper Extremity Assessment: Generalized weakness           Communication Communication Communication: No difficulties   Cognition Arousal/Alertness: Awake/alert Behavior During Therapy: Agitated;Anxious Overall Cognitive Status: No family/caregiver present to determine baseline cognitive functioning                                                Home Living Family/patient expects to be discharged to:: Private residence Living Arrangements: Alone Available Help at Discharge: Family Type of Home: House Home Access: Stairs to enter   Entrance Stairs-Rails: Right Home Layout: Two level;Able to live on main level with bedroom/bathroom     Bathroom Shower/Tub: Producer, television/film/video: Standard     Home Equipment: Environmental consultant - 2 wheels  OT Problem List: Decreased activity tolerance;Impaired balance (sitting and/or standing);Decreased safety awareness      OT Treatment/Interventions: Self-care/ADL training;Patient/family education;Energy conservation;Therapeutic activities    OT Goals(Current goals can be found in the care plan section) Acute Rehab OT Goals Patient Stated Goal: leave- i   have waited long enough OT Goal Formulation: With patient Time For Goal Achievement: 06/21/17 Potential to Achieve Goals: Good  OT Frequency: Min 2X/week   Barriers to D/C: Decreased caregiver support             AM-PAC PT "6 Clicks" Daily Activity     Outcome Measure Help from another person eating meals?: None Help from another person taking care of  personal grooming?: A Little Help from another person toileting, which includes using toliet, bedpan, or urinal?: A Little Help from another person bathing (including washing, rinsing, drying)?: A Little Help from another person to put on and taking off regular upper body clothing?: A Little Help from another person to put on and taking off regular lower body clothing?: A Little 6 Click Score: 19   End of Session    Activity Tolerance: Other (comment);Treatment limited secondary to agitation(pt planning to leave AMA) Patient left: Other (comment)(in WC with CNA)  OT Visit Diagnosis: Unsteadiness on feet (R26.81);Other abnormalities of gait and mobility (R26.89);History of falling (Z91.81);Repeated falls (R29.6)                Time: 5997-7414 OT Time Calculation (min): 11 min Charges:  OT General Charges $OT Visit: 1 Visit OT Evaluation $OT Eval Moderate Complexity: 1 Mod G-Codes:     Lise Auer, OT [phone]  Einar Crow D 06/14/2017, 1:17 PM

## 2017-06-14 NOTE — Progress Notes (Signed)
Patient left the floor without medical advice Rn Lurena Joiner were telling me to take  patient to the main Lobby, patient were waiting for her ride and while waiting she told me to call her friend Darl Pikes I Did. The Secretary Vicky called me  to let me know that patient's friend is on her way to come to pick her  up [name].

## 2017-06-14 NOTE — Progress Notes (Signed)
[facility] On Call MD at bedside at this time.  Patient states she is on the phone with the "[employer] which is a benefit with health insurance".

## 2017-06-15 LAB — URINE CULTURE: SPECIAL REQUESTS: NORMAL

## 2017-06-16 ENCOUNTER — Telehealth: Payer: Self-pay | Admitting: Family Medicine

## 2017-06-16 ENCOUNTER — Ambulatory Visit: Payer: Self-pay

## 2017-06-16 NOTE — Telephone Encounter (Signed)
Pt calling with multiple complaints.  1. Flank pain: Pt states that she was admitted to the hospital for kidney infection for 2 days of IV abx- she stated that they gave her Rocephin. States pain is an 8/10 and feels like a dull ache that radiates down her right leg. She states that she is having burning and urgency and is incontinent.Pt also c/o abdominal pain, leg weakness, dizziness and disorientation and burning with urination. 2. She repeated several times "I need an FL-2 so I can get into [place]." "How do I get an FL-2?" She states she has MS and cannot cannot take care of herself. She stated that she incontinent and urine is running down her legs.  Advised pt that she would need an OV and would need to be seen by a provider. Pt states that she does not have transportation. Pt states she is very sick. Advised pt to call 911 to get evaluated at the ED. Pt refused. Offered her several appointments to be seen tomorrow and she refused. Pt stated that she will try and get transportation so she can see a provider. Offered again to make her an appt and again she refused.    Reason for Disposition . MODERATE pain (e.g., interferes with normal activities or awakens from sleep)  Answer Assessment - Initial Assessment Questions 1. LOCATION: "Where does it hurt?" (e.g., left, right)     Both flanks   2. ONSET: "When did the pain start?"     Pt became frustrated stating "Dont ask me a time line! I cant do that!" 3. SEVERITY: "How bad is the pain?" (e.g., Scale 1-10; mild, moderate, or severe)   - MILD (1-3): doesn't interfere with normal activities    - MODERATE (4-7): interferes with normal activities or awakens from sleep    - SEVERE (8-10): excruciating pain and patient unable to do normal activities (stays in bed)       8 severe 4. PATTERN: "Does the pain come and go, or is it constant?"      constant 5. CAUSE: "What do you think is causing the pain?"     Kidney infection Pt states she was in the  hospital for 2 days and revcieved IV abx but did not mention where. 6. OTHER SYMPTOMS:  "Do you have any other symptoms?" (e.g., fever, abdominal pain, vomiting, leg weakness, burning with urination, blood in urine)     Abdominal pain, leg weakness, dizziness and disorientation, burning with urination 7. PREGNANCY:  "Is there any chance you are pregnant?" "When was your last menstrual period?"     n/a  Protocols used: FLANK PAIN-A-AH

## 2017-06-16 NOTE — Telephone Encounter (Signed)
Anne Black called and said that since she has been in the office she still isn't better and she has no way to get to the office. She really wants Dr. Eldred Manges to help her with the pain. Please call Anne Black back, thanks.

## 2017-06-16 NOTE — Telephone Encounter (Signed)
Pt states she called earlier to request a FL2. Pt wants to be admitted to a rehab facility , [facility].  Pt is requesting a call back asap.  Pt states she is in so much pain. Pt states she cannot take care of herself. Pt states she has a UTI. And severe back pain.

## 2017-06-16 NOTE — Telephone Encounter (Signed)
Needs OV.  

## 2017-06-16 NOTE — Telephone Encounter (Signed)
Copied from CRM [id number]. Topic: Quick Communication - See Telephone Encounter >> Jun 16, 2017 12:50 PM Trula Slade wrote: CRM for notification. See Telephone encounter for: 06/16/17. Patient stated she was given a prescription in the hospital [facility]) to help her come off of alcohol.  She got out of the hospital a couple of days ago.  She would like to know if the provider could write a prescription for this medication since she is out of the hospital.

## 2017-06-16 NOTE — Telephone Encounter (Signed)
OV needed?

## 2017-06-17 ENCOUNTER — Telehealth: Payer: Self-pay | Admitting: Family Medicine

## 2017-06-17 NOTE — Telephone Encounter (Signed)
Patient needs appointment for prescription. Please call to schedule.

## 2017-06-17 NOTE — Telephone Encounter (Signed)
Copied from CRM [id number]. Topic: Quick Communication - See Telephone Encounter >> Jun 17, 2017 11:49 AM Eston Mould B wrote: CRM for notification. See Telephone encounter for: 06/17/17.  Pt is requesting tessalon for her cough sent to  [id number] - [place], [place] - [address]

## 2017-06-18 NOTE — Telephone Encounter (Signed)
Pt has been called MULTIPLE times with no call back and no answer to make an appointment we cannot get a hold of the patient so if she calls back please make her an appointment because we cannot refill any medications for her until she is seen in the office.

## 2017-07-07 ENCOUNTER — Emergency Department (HOSPITAL_COMMUNITY)
Admission: EM | Admit: 2017-07-07 | Discharge: 2017-07-08 | Disposition: A | Discharge status: Home / Self Care | Payer: No Typology Code available for payment source | Attending: Emergency Medicine | Admitting: Emergency Medicine

## 2017-07-07 ENCOUNTER — Encounter (HOSPITAL_COMMUNITY): Payer: Self-pay

## 2017-07-07 DIAGNOSIS — G35 Multiple sclerosis: Secondary | ICD-10-CM | POA: Insufficient documentation

## 2017-07-07 DIAGNOSIS — F10229 Alcohol dependence with intoxication, unspecified: Secondary | ICD-10-CM | POA: Diagnosis not present

## 2017-07-07 DIAGNOSIS — R197 Diarrhea, unspecified: Secondary | ICD-10-CM

## 2017-07-07 DIAGNOSIS — F419 Anxiety disorder, unspecified: Secondary | ICD-10-CM | POA: Diagnosis not present

## 2017-07-07 DIAGNOSIS — F909 Attention-deficit hyperactivity disorder, unspecified type: Secondary | ICD-10-CM | POA: Diagnosis not present

## 2017-07-07 DIAGNOSIS — F319 Bipolar disorder, unspecified: Secondary | ICD-10-CM | POA: Insufficient documentation

## 2017-07-07 DIAGNOSIS — R110 Nausea: Secondary | ICD-10-CM

## 2017-07-07 DIAGNOSIS — Z79899 Other long term (current) drug therapy: Secondary | ICD-10-CM | POA: Diagnosis not present

## 2017-07-07 DIAGNOSIS — J449 Chronic obstructive pulmonary disease, unspecified: Secondary | ICD-10-CM | POA: Diagnosis not present

## 2017-07-07 DIAGNOSIS — F17210 Nicotine dependence, cigarettes, uncomplicated: Secondary | ICD-10-CM | POA: Insufficient documentation

## 2017-07-07 DIAGNOSIS — F10920 Alcohol use, unspecified with intoxication, uncomplicated: Secondary | ICD-10-CM

## 2017-07-07 HISTORY — DX: Alcohol dependence, uncomplicated: F10.20

## 2017-07-07 NOTE — ED Triage Notes (Signed)
EMS gave 4mg  zofran in route

## 2017-07-07 NOTE — ED Triage Notes (Signed)
Pt called EMS for alcohol detox and nausea, pt has also been off her buspar for three days and having bipolar episodes

## 2017-07-07 NOTE — ED Provider Notes (Signed)
[facility]-EMERGENCY DEPT Provider Note: Lowella Dell, MD, FACEP  CSN: [id number] MRN: [id number] ARRIVAL: 07/07/17 at 2141 ROOM: WA14/WA14   CHIEF COMPLAINT  Diarrhea   HISTORY OF PRESENT ILLNESS  07/07/17 11:48 PM Anne Black is a 57 y.o. female with a history of alcoholism and bipolar disorder.  She is here with diarrhea which she states has been present all day.  She describes it as watery but not bloody. She is also had nausea but no vomiting.  She has been taking Zofran today without adequate relief of her nausea.  She admits to drinking alcohol today.  She is having abdominal pain which she relates to a slowly healing mid abdominal surgical incision.  This pain is not severe but is worse with palpation.  Nurse's notes reports that she has been off her BuSpar for 3 days and has "been having bipolar episodes".  The patient denies any exacerbation of her bipolar disorder.  He denies any illicit drug use.   Past Medical History:  Diagnosis Date  . ADHD (attention deficit hyperactivity disorder)   . Alcoholism (HCC)   . Anxiety   . Arthritis   . Ascites   . Bipolar disorder (HCC)   . Cirrhosis (HCC)   . COPD (chronic obstructive pulmonary disease) (HCC)   . Hypokalemia   . Migraine   . Multiple sclerosis (HCC)   . Narcolepsy   . Osteoporosis     Past Surgical History:  Procedure Laterality Date  . ABDOMINAL WALL DEFECT REPAIR N/A 12/30/2016   Procedure: EXPLORATORY LAPAROTOMY WITH REPAIR ABDOMINAL WALL VENTRAL HERNIA;  Surgeon: [name], MD;  Location: [facility] OR;  Service: General;  Laterality: N/A;  . APPLICATION OF WOUND VAC N/A 12/30/2016   Procedure: APPLICATION OF WOUND VAC;  Surgeon: [name], MD;  Location: [facility] OR;  Service: General;  Laterality: N/A;  . CESAREAN SECTION  [phone]  . FRACTURE SURGERY    . HERNIA REPAIR    . MYRINGOTOMY WITH TUBE PLACEMENT Bilateral   . ORIF WRIST FRACTURE Left 09/09/2016   Procedure: OPEN REDUCTION INTERNAL FIXATION  (ORIF) LEFT WRIST FRACTURE, LEFT CARPAL TUNNEL RELEASE;  Surgeon: Dominica Severin, MD;  Location: [facility] ORS;  Service: Orthopedics;  Laterality: Left;  . TONSILLECTOMY      Family History  Problem Relation Age of Onset  . Arrhythmia Mother   . Heart disease Father   . Hypertension Father     Social History   Tobacco Use  . Smoking status: Current Every Day Smoker    Packs/day: 1.00    Years: 35.00    Pack years: 35.00    Types: Cigarettes  . Smokeless tobacco: Never Used  Substance Use Topics  . Alcohol use: Yes    Alcohol/week: 16.8 oz    Types: 28 Glasses of wine per week    Comment: A bottle of wine or more per day.  . Drug use: No    Prior to Admission medications   Medication Sig Start Date End Date Taking? Authorizing Provider  FLUoxetine (PROZAC) 20 MG capsule Take 20 mg by mouth daily.  03/30/16  Yes [provider]  fluticasone (FLONASE) 50 MCG/ACT nasal spray Place 1 spray into both nostrils as needed for allergies or rhinitis.   Yes [provider]  Fluticasone-Salmeterol (ADVAIR) 100-50 MCG/DOSE AEPB Inhale 1-2 puffs 2 (two) times daily into the lungs.  11/05/14  Yes [provider]  furosemide (LASIX) 40 MG tablet Take 1 tablet (40 mg total) by mouth daily.  06/17/16  Yes Midge Minium, MD  gabapentin (NEURONTIN) 100 MG capsule Take 100 mg by mouth 3 (three) times daily. 06/02/17  Yes [provider]  hydrocortisone cream 1 % Apply topically 3 (three) times daily. 04/20/17  Yes Sheikh, Omair Latif, DO  hydrOXYzine (VISTARIL) 25 MG capsule Take 25 mg by mouth 3 (three) times daily as needed for anxiety or itching.  05/08/17  Yes [provider]  lisdexamfetamine (VYVANSE) 70 MG capsule Take 1 capsule (70 mg total) by mouth daily. 05/06/17  Yes Plunkett, Alphonzo Lemmings, MD  modafinil (PROVIGIL) 200 MG tablet Take 200 mg by mouth 2 (two) times daily.  08/04/16  Yes [provider]  nicotine (NICODERM CQ - DOSED IN MG/24 HOURS) 21  mg/24hr patch Place 1 patch (21 mg total) onto the skin daily. 04/21/17  Yes Sheikh, Omair Latif, DO  pantoprazole (PROTONIX) 40 MG tablet Take 1 tablet (40 mg total) by mouth daily. 12/30/15  Yes Haney, Alyssa A, MD  potassium chloride SA (K-DUR,KLOR-CON) 20 MEQ tablet Take 20 mEq by mouth as needed (low potassium).    Yes [provider]  risperiDONE (RISPERDAL) 0.5 MG tablet Take 1 tablet (0.5 mg total) by mouth 3 (three) times daily as needed (agitation). Patient taking differently: Take 0.5 mg by mouth 2 (two) times daily.  02/06/16  Yes Renne Musca, MD  rizatriptan (MAXALT) 5 MG tablet TK 1 T PO QD 06/14/17  Yes [provider]  spironolactone (ALDACTONE) 100 MG tablet Take 1 tablet (100 mg total) by mouth daily. 11/23/16  Yes Derwood Kaplan, MD  topiramate (TOPAMAX) 100 MG tablet Take 1 tablet (100 mg total) by mouth daily. Patient taking differently: Take 100 mg by mouth every evening.  01/07/16  Yes [name], MD  albuterol (PROVENTIL HFA;VENTOLIN HFA) 108 (90 Base) MCG/ACT inhaler Inhale 2 puffs into the lungs every 6 (six) hours as needed for wheezing or shortness of breath.    [provider]  Menthol-Methyl Salicylate (MUSCLE RUB) 10-15 % CREA Apply 1 application topically as needed for muscle pain. 04/20/17   Merlene Laughter, DO    Allergies Patient has no known allergies.   REVIEW OF SYSTEMS  Negative except as noted here or in the History of Present Illness.   PHYSICAL EXAMINATION  Initial Vital Signs There were no vitals taken for this visit.  Examination General: Well-developed, well-nourished female in no acute distress; appearance consistent with age of record HENT: normocephalic; atraumatic Eyes: pupils equal, round and reactive to light; extraocular muscles intact Neck: supple Heart: regular rate and rhythm; no murmurs, rubs or gallops Lungs: clear to auscultation bilaterally Abdomen: soft; distended; abdominal  tenderness around chronic appearing healing surgical wound; bowel sounds present Extremities: No deformity; full range of motion; pulses normal Neurologic: Awake, alert and oriented; motor function intact in all extremities and symmetric; no facial droop Skin: Warm and dry Psychiatric: Normal mood and affect   RESULTS  Summary of this visit's results, reviewed by myself:   EKG Interpretation  Date/Time:    Ventricular Rate:    PR Interval:    QRS Duration:   QT Interval:    QTC Calculation:   R Axis:     Text Interpretation:        Laboratory Studies: Results for orders placed or performed during the hospital encounter of 07/07/17 (from the past 24 hour(s))  CBC with Differential/Platelet     Status: Abnormal   Collection Time: 07/08/17  2:29 AM  Result Value  Ref Range   WBC 5.2 4.0 - 10.5 K/uL   RBC 4.49 3.87 - 5.11 MIL/uL   Hemoglobin 12.1 12.0 - 15.0 g/dL   HCT 16.6 06.3 - 01.6 %   MCV 86.0 78.0 - 100.0 fL   MCH 26.9 26.0 - 34.0 pg   MCHC 31.3 30.0 - 36.0 g/dL   RDW 01.0 (H) 93.2 - 35.5 %   Platelets 151 150 - 400 K/uL   Neutrophils Relative % 74 %   Neutro Abs 3.9 1.7 - 7.7 K/uL   Lymphocytes Relative 20 %   Lymphs Abs 1.0 0.7 - 4.0 K/uL   Monocytes Relative 5 %   Monocytes Absolute 0.3 0.1 - 1.0 K/uL   Eosinophils Relative 1 %   Eosinophils Absolute 0.1 0.0 - 0.7 K/uL   Basophils Relative 0 %   Basophils Absolute 0.0 0.0 - 0.1 K/uL  Comprehensive metabolic panel     Status: Abnormal   Collection Time: 07/08/17  2:29 AM  Result Value Ref Range   Sodium 143 135 - 145 mmol/L   Potassium 3.5 3.5 - 5.1 mmol/L   Chloride 103 101 - 111 mmol/L   CO2 31 22 - 32 mmol/L   Glucose, Bld 112 (H) 65 - 99 mg/dL   BUN <5 (L) 6 - 20 mg/dL   Creatinine, Ser 7.32 (L) 0.44 - 1.00 mg/dL   Calcium 8.3 (L) 8.9 - 10.3 mg/dL   Total Protein 6.5 6.5 - 8.1 g/dL   Albumin 3.5 3.5 - 5.0 g/dL   AST 47 (H) 15 - 41 U/L   ALT 25 14 - 54 U/L   Alkaline Phosphatase 149 (H) 38 - 126 U/L    Total Bilirubin 1.0 0.3 - 1.2 mg/dL   GFR calc non Af Amer >60 >60 mL/min   GFR calc Af Amer >60 >60 mL/min   Anion gap 9 5 - 15  Ethanol     Status: Abnormal   Collection Time: 07/08/17  2:30 AM  Result Value Ref Range   Alcohol, Ethyl (B) 191 (H) <10 mg/dL   Imaging Studies: No results found.  ED COURSE and MDM  Nursing notes and initial vitals signs, including pulse oximetry, reviewed.  Vitals:   07/08/17 0234  BP: 127/64  Pulse: 74  Resp: 18  SpO2: 95%   4:42 AM Patient has repeatedly made retching sounds claiming she is vomiting but no actual emesis has been observed.  She is requesting haloperidol for her nausea.  It is noted that she is intoxicated.  5:16 AM Patient sleeping comfortably after Haldol 2 mg IV.  PROCEDURES    ED DIAGNOSES     ICD-10-CM   1. Diarrhea, unspecified type R19.7   2. Nausea R11.0   3. Alcoholic intoxication without complication (HCC) F10.920        Akaila Rambo, Jonny Ruiz, MD 07/08/17 [phone]

## 2017-07-07 NOTE — ED Notes (Signed)
Patient extremely rude and abusing call bell for food and various other demands. Multiple staff members have entered her room and been yelled at or spoken down to.

## 2017-07-08 LAB — CBC WITH DIFFERENTIAL/PLATELET
BASOS ABS: 0 10*3/uL (ref 0.0–0.1)
BASOS PCT: 0 %
EOS ABS: 0.1 10*3/uL (ref 0.0–0.7)
EOS PCT: 1 %
HCT: 38.6 % (ref 36.0–46.0)
Hemoglobin: 12.1 g/dL (ref 12.0–15.0)
LYMPHS PCT: 20 %
Lymphs Abs: 1 10*3/uL (ref 0.7–4.0)
MCH: 26.9 pg (ref 26.0–34.0)
MCHC: 31.3 g/dL (ref 30.0–36.0)
MCV: 86 fL (ref 78.0–100.0)
MONO ABS: 0.3 10*3/uL (ref 0.1–1.0)
Monocytes Relative: 5 %
Neutro Abs: 3.9 10*3/uL (ref 1.7–7.7)
Neutrophils Relative %: 74 %
PLATELETS: 151 10*3/uL (ref 150–400)
RBC: 4.49 MIL/uL (ref 3.87–5.11)
RDW: 17.7 % — AB (ref 11.5–15.5)
WBC: 5.2 10*3/uL (ref 4.0–10.5)

## 2017-07-08 LAB — COMPREHENSIVE METABOLIC PANEL
ALBUMIN: 3.5 g/dL (ref 3.5–5.0)
ALT: 25 U/L (ref 14–54)
AST: 47 U/L — AB (ref 15–41)
Alkaline Phosphatase: 149 U/L — ABNORMAL HIGH (ref 38–126)
Anion gap: 9 (ref 5–15)
CHLORIDE: 103 mmol/L (ref 101–111)
CO2: 31 mmol/L (ref 22–32)
CREATININE: 0.35 mg/dL — AB (ref 0.44–1.00)
Calcium: 8.3 mg/dL — ABNORMAL LOW (ref 8.9–10.3)
GFR calc Af Amer: >60 mL/min (ref >60)
GFR calc non Af Amer: >60 mL/min (ref >60)
GLUCOSE: 112 mg/dL — AB (ref 65–99)
POTASSIUM: 3.5 mmol/L (ref 3.5–5.1)
SODIUM: 143 mmol/L (ref 135–145)
Total Bilirubin: 1 mg/dL (ref 0.3–1.2)
Total Protein: 6.5 g/dL (ref 6.5–8.1)

## 2017-07-08 LAB — ETHANOL: ALCOHOL ETHYL (B): 191 mg/dL — AB (ref <10)

## 2017-07-08 MED ORDER — THIAMINE HCL 100 MG/ML IJ SOLN
Freq: Once | INTRAVENOUS | Status: AC
  Administered 2017-07-08: 02:00:00 via INTRAVENOUS
  Filled 2017-07-08: qty 1000

## 2017-07-08 MED ORDER — BISMUTH SUBSALICYLATE 262 MG PO CHEW: 524.0000 mg | CHEWABLE_TABLET | ORAL | 0 refills | Status: DC | PRN

## 2017-07-08 MED ORDER — ONDANSETRON HCL 4 MG/2ML IJ SOLN
4.0000 mg | Freq: Once | INTRAMUSCULAR | Status: AC
  Administered 2017-07-08: 4 mg via INTRAVENOUS
  Filled 2017-07-08: qty 2

## 2017-07-08 MED ORDER — LOPERAMIDE HCL 2 MG PO CAPS
4.0000 mg | ORAL_CAPSULE | Freq: Once | ORAL | Status: AC
  Administered 2017-07-08: 4 mg via ORAL
  Filled 2017-07-08: qty 2

## 2017-07-08 MED ORDER — HALOPERIDOL LACTATE 5 MG/ML IJ SOLN
2.0000 mg | Freq: Once | INTRAMUSCULAR | Status: AC
  Administered 2017-07-08: 2 mg via INTRAVENOUS
  Filled 2017-07-08: qty 1

## 2017-07-08 MED ORDER — IBUPROFEN 200 MG PO TABS
400.0000 mg | ORAL_TABLET | Freq: Once | ORAL | Status: AC
  Administered 2017-07-08: 400 mg via ORAL
  Filled 2017-07-08: qty 2

## 2017-07-08 NOTE — ED Notes (Signed)
Went in room to check on patient, patient was asleep and when I asked her if she was feeling better after the haldol she stated "yes".

## 2017-07-08 NOTE — ED Notes (Signed)
Patient refusing to leave. Dr. Read Drivers asked to come in and speak to patient upon patients request. Directed to call security to escort patient off premises due to rude and degrading behavior towards our staff.

## 2017-07-08 NOTE — ED Notes (Signed)
Attempted to go in and start patient's IV. Patient was on commode and requested more time.

## 2017-07-08 NOTE — ED Notes (Signed)
Patient demanded her IV be removed so that she could leave. I attempted to explain her wait but ended up removing her IV after she called out again so she would not attempt to pull it out herself.

## 2017-07-08 NOTE — ED Notes (Signed)
Patient soiled herself. This nurse assisted the patient in putting on a new brief. Patient complained about brief being "too loose", when I tried to show the patient how to adjust her brief to her liking she yelled "isn't that your job?!" This nurse removed herself from the room.

## 2017-07-08 NOTE — ED Notes (Addendum)
Patient had a bowel movement all over the bathroom floor.

## 2017-07-10 ENCOUNTER — Other Ambulatory Visit: Payer: Self-pay

## 2017-07-10 ENCOUNTER — Encounter (HOSPITAL_COMMUNITY): Payer: Self-pay

## 2017-07-10 ENCOUNTER — Emergency Department (HOSPITAL_COMMUNITY)
Admission: EM | Admit: 2017-07-10 | Discharge: 2017-07-10 | Disposition: A | Discharge status: Home / Self Care | Payer: Medicare Other | Attending: Emergency Medicine | Admitting: Emergency Medicine

## 2017-07-10 DIAGNOSIS — Z79899 Other long term (current) drug therapy: Secondary | ICD-10-CM | POA: Diagnosis not present

## 2017-07-10 DIAGNOSIS — F1010 Alcohol abuse, uncomplicated: Secondary | ICD-10-CM

## 2017-07-10 DIAGNOSIS — F17210 Nicotine dependence, cigarettes, uncomplicated: Secondary | ICD-10-CM | POA: Diagnosis not present

## 2017-07-10 DIAGNOSIS — R627 Adult failure to thrive: Secondary | ICD-10-CM | POA: Diagnosis not present

## 2017-07-10 DIAGNOSIS — J449 Chronic obstructive pulmonary disease, unspecified: Secondary | ICD-10-CM | POA: Diagnosis not present

## 2017-07-10 DIAGNOSIS — E870 Hyperosmolality and hypernatremia: Secondary | ICD-10-CM

## 2017-07-10 DIAGNOSIS — G35 Multiple sclerosis: Secondary | ICD-10-CM | POA: Insufficient documentation

## 2017-07-10 DIAGNOSIS — R197 Diarrhea, unspecified: Secondary | ICD-10-CM

## 2017-07-10 LAB — COMPREHENSIVE METABOLIC PANEL
ALBUMIN: 3.5 g/dL (ref 3.5–5.0)
ALK PHOS: 151 U/L — AB (ref 38–126)
ALT: 25 U/L (ref 14–54)
AST: 38 U/L (ref 15–41)
Anion gap: 11 (ref 5–15)
BILIRUBIN TOTAL: 0.9 mg/dL (ref 0.3–1.2)
BUN: 5 mg/dL — AB (ref 6–20)
CO2: 28 mmol/L (ref 22–32)
Calcium: 8.7 mg/dL — ABNORMAL LOW (ref 8.9–10.3)
Chloride: 107 mmol/L (ref 101–111)
Creatinine, Ser: 0.41 mg/dL — ABNORMAL LOW (ref 0.44–1.00)
GFR calc Af Amer: >60 mL/min (ref >60)
GFR calc non Af Amer: >60 mL/min (ref >60)
GLUCOSE: 108 mg/dL — AB (ref 65–99)
POTASSIUM: 3.5 mmol/L (ref 3.5–5.1)
Sodium: 146 mmol/L — ABNORMAL HIGH (ref 135–145)
TOTAL PROTEIN: 6.3 g/dL — AB (ref 6.5–8.1)

## 2017-07-10 LAB — CBC WITH DIFFERENTIAL/PLATELET
Basophils Absolute: 0 K/uL (ref 0.0–0.1)
Basophils Relative: 0 %
Eosinophils Absolute: 0 K/uL (ref 0.0–0.7)
Eosinophils Relative: 0 %
HCT: 38.8 % (ref 36.0–46.0)
Hemoglobin: 12.2 g/dL (ref 12.0–15.0)
Lymphocytes Relative: 7 %
Lymphs Abs: 0.5 K/uL — ABNORMAL LOW (ref 0.7–4.0)
MCH: 27.1 pg (ref 26.0–34.0)
MCHC: 31.4 g/dL (ref 30.0–36.0)
MCV: 86 fL (ref 78.0–100.0)
Monocytes Absolute: 0.5 K/uL (ref 0.1–1.0)
Monocytes Relative: 7 %
Neutro Abs: 6.1 K/uL (ref 1.7–7.7)
Neutrophils Relative %: 86 %
Platelets: 169 K/uL (ref 150–400)
RBC: 4.51 MIL/uL (ref 3.87–5.11)
RDW: 18 % — ABNORMAL HIGH (ref 11.5–15.5)
WBC: 7.1 K/uL (ref 4.0–10.5)

## 2017-07-10 LAB — URINALYSIS, ROUTINE W REFLEX MICROSCOPIC
Bilirubin Urine: NEGATIVE
GLUCOSE, UA: NEGATIVE mg/dL
Hgb urine dipstick: NEGATIVE
KETONES UR: NEGATIVE mg/dL
LEUKOCYTES UA: NEGATIVE
Nitrite: NEGATIVE
PH: 6 (ref 5.0–8.0)
Protein, ur: NEGATIVE mg/dL
Specific Gravity, Urine: 1.018 (ref 1.005–1.030)

## 2017-07-10 LAB — I-STAT TROPONIN, ED: Troponin i, poc: 0 ng/mL (ref 0.00–0.08)

## 2017-07-10 LAB — ETHANOL: Alcohol, Ethyl (B): 263 mg/dL — ABNORMAL HIGH

## 2017-07-10 LAB — LIPASE, BLOOD: Lipase: 36 U/L (ref 11–51)

## 2017-07-10 LAB — POC OCCULT BLOOD, ED: Fecal Occult Bld: NEGATIVE

## 2017-07-10 MED ORDER — LOPERAMIDE HCL 2 MG PO CAPS
2.0000 mg | ORAL_CAPSULE | Freq: Once | ORAL | Status: AC
Start: 2017-07-10 — End: 2017-07-10
  Administered 2017-07-10: 2 mg via ORAL
  Filled 2017-07-10: qty 1

## 2017-07-10 MED ORDER — HYDROCODONE-ACETAMINOPHEN 5-325 MG PO TABS
1.0000 | ORAL_TABLET | Freq: Once | ORAL | Status: AC
Start: 2017-07-10 — End: 2017-07-10
  Administered 2017-07-10: 1 via ORAL
  Filled 2017-07-10: qty 1

## 2017-07-10 NOTE — ED Notes (Addendum)
Pt continues to hit call light and holler for help, despite being informed of what is going on and this RN being in the room approximately 3 minutes earlier. Pt chose to climb out of bed without any issues, steady on her feet, and defecate on the floor. The pt then emerged naked into the hallway demanding help wiping her behind. Pt then informed to use washcloth. Pt walked to washcloths, wiped self x3. Pt then demanded this RN wipe her behind. This RN informed pt she needed to maintain her independence. Pt then stated "on the 5th floor they wipe my butt! You need to too!" This RN reminded pt she is fully capable of doing it herself. Pt cleaned herself up fully. Pt then asked this RN to stay in the room to keep her company. This RN informed pt she cannot, as she needs to help other pts. Pt stated that she needs to leave. This RN removed IV. Pt provided clean scrubs and dressed herself without any issues. Pt then ambulated without complication to the restroom. Pt then stated she needed top stay because her behind was sore. Pt informed that her behavior is unacceptable.

## 2017-07-10 NOTE — ED Triage Notes (Addendum)
Pt found by EMS covered in her own feces. Pt called for EMS. N/V. Denies abd pain. Pt states that her "rectum is very raw". Pt states that she hasn't had control of her feces for 2-3 days. Pt also c/o an "arrythmia- they kept me at [facility] all day long".

## 2017-07-10 NOTE — ED Notes (Signed)
Pt provided imodium and a taxi voucher. Pt then demanding a prescription. Pt informed they could go to their pharmacy and pick up over the counter. Pt then brought to lobby.

## 2017-07-10 NOTE — ED Provider Notes (Addendum)
[facility]-EMERGENCY DEPT Provider Note   CSN: [id number] Arrival date & time: 07/10/17  1023     History   Chief Complaint Chief Complaint  Patient presents with  . Diarrhea    HPI Anne Black is a 57 y.o. female who presents with N/V/D. PMH significant for alcohol abuse with liver cirrhosis and ascites, COPD, MS, hx of abdominal wall wound, bipolar d/o, current tobacco abuse. She was recently seen in the ED for similar symptoms. She states she begged them to keep her at that time because she really wants to go to rehab for her alcohol abuse. She reports drinking 3-4 48oz of beer daily. She did drink a little this morning. She reports that she's unable to care for herself at home and would like to be admitted essentially for failure to thrive. She's been having multiple episodes of non-bloody diarrhea for the past several days. She denies fever, chills. She reports chest tightness, SOB, cough, abdominal pain, nausea. She denies vomiting or urinary symptoms. She reports her bottom is raw due to the diarrhea. She called EMS because she could not control the diarrhea despite taking Imodium and she was found covered in feces. She also has been having palpitations.   HPI  Past Medical History:  Diagnosis Date  . ADHD (attention deficit hyperactivity disorder)   . Alcoholism (HCC)   . Anxiety   . Arthritis   . Ascites   . Bipolar disorder (HCC)   . Cirrhosis (HCC)   . COPD (chronic obstructive pulmonary disease) (HCC)   . Hypokalemia   . Migraine   . Multiple sclerosis (HCC)   . Narcolepsy   . Osteoporosis     Patient Active Problem List   Diagnosis Date Noted  . COPD exacerbation (HCC) 06/12/2017  . Chest pain with moderate risk for cardiac etiology 05/30/2017  . Abnormal LFTs 05/30/2017  . Tachycardia 05/30/2017  . COPD (chronic obstructive pulmonary disease) (HCC) 04/19/2017  . Right rib fracture 04/15/2017  . Abdominal wall cellulitis 12/30/2016  .  S/P exploratory laparotomy 12/30/2016  . Distal radius fracture, left 09/09/2016  . Thrombocytopenia (HCC) 09/09/2016  . Chest x-ray abnormality   . Umbilical hernia without obstruction and without gangrene 05/12/2016  . Itching 02/24/2016  . Ventral hernia without obstruction or gangrene 02/24/2016  . Palliative care encounter   . Ascites   . Tachypnea   . Atrial flutter with rapid ventricular response (HCC)   . Hypomagnesemia   . Hypoxia   . Dehydration 01/22/2016  . Low back ache 12/30/2015  . Non-intractable vomiting with nausea   . Alcohol abuse with alcohol-induced mood disorder (HCC) 12/15/2015  . Acute alcoholism (HCC)   . Abdominal distension 11/04/2015  . Alcohol abuse   . Dyspnea   . Acute respiratory failure (HCC) 10/15/2015  . Ascites due to alcoholic cirrhosis (HCC) 10/15/2015  . Osteoporosis   . Bipolar I disorder (HCC)   . Multiple sclerosis (HCC)   . Alcoholic cirrhosis of liver with ascites (HCC) 08/22/2015  . Bipolar disorder, current episode mixed, moderate (HCC) 08/01/2015  . Alcohol use disorder, severe, dependence (HCC) 07/31/2015  . Macrocytic anemia- due to alcohol abuse with normal B12 & folate levels 05/31/2015  . Severe protein-calorie malnutrition (HCC) 05/31/2015  . Hypokalemia 05/26/2015  . Encephalopathy, hepatic (HCC) 05/26/2015  . Alcohol withdrawal (HCC) 05/18/2015  . Abdominal pain 05/18/2015  . Anxiety 05/18/2015  . UTI (lower urinary tract infection) 05/18/2015  . Stimulant abuse (HCC) 12/27/2014  .  Nicotine abuse 12/27/2014  . Hyperprolactinemia (HCC) 11/15/2014  . Dyslipidemia   . Tobacco abuse 01/23/2014  . Noncompliance with therapeutic plan 04/04/2013  . Hereditary and idiopathic peripheral neuropathy 05/01/2012  . GERD (gastroesophageal reflux disease) 05/01/2012  . Osteoarthrosis, unspecified whether generalized or localized, involving lower leg 05/01/2012  . Hyponatremia 07/01/2011    Past Surgical History:  Procedure  Laterality Date  . ABDOMINAL WALL DEFECT REPAIR N/A 12/30/2016   Procedure: EXPLORATORY LAPAROTOMY WITH REPAIR ABDOMINAL WALL VENTRAL HERNIA;  Surgeon: [name], MD;  Location: [facility] OR;  Service: General;  Laterality: N/A;  . APPLICATION OF WOUND VAC N/A 12/30/2016   Procedure: APPLICATION OF WOUND VAC;  Surgeon: [name], MD;  Location: [facility] OR;  Service: General;  Laterality: N/A;  . CESAREAN SECTION  [phone]  . FRACTURE SURGERY    . HERNIA REPAIR    . MYRINGOTOMY WITH TUBE PLACEMENT Bilateral   . ORIF WRIST FRACTURE Left 09/09/2016   Procedure: OPEN REDUCTION INTERNAL FIXATION (ORIF) LEFT WRIST FRACTURE, LEFT CARPAL TUNNEL RELEASE;  Surgeon: Dominica Severin, MD;  Location: [facility] ORS;  Service: Orthopedics;  Laterality: Left;  . TONSILLECTOMY       OB History   None      Home Medications    Prior to Admission medications   Medication Sig Start Date End Date Taking? Authorizing Provider  albuterol (PROVENTIL HFA;VENTOLIN HFA) 108 (90 Base) MCG/ACT inhaler Inhale 2 puffs into the lungs every 6 (six) hours as needed for wheezing or shortness of breath.    [provider]  bismuth subsalicylate (PEPTO-BISMOL) 262 MG chewable tablet Chew 2 tablets (524 mg total) by mouth as needed for diarrhea or loose stools. 07/08/17   Molpus, John, MD  FLUoxetine (PROZAC) 20 MG capsule Take 20 mg by mouth daily.  03/30/16   [provider]  fluticasone (FLONASE) 50 MCG/ACT nasal spray Place 1 spray into both nostrils as needed for allergies or rhinitis.    [provider]  Fluticasone-Salmeterol (ADVAIR) 100-50 MCG/DOSE AEPB Inhale 1-2 puffs 2 (two) times daily into the lungs.  11/05/14   [provider]  furosemide (LASIX) 40 MG tablet Take 1 tablet (40 mg total) by mouth daily. 06/17/16   Midge Minium, MD  gabapentin (NEURONTIN) 100 MG capsule Take 100 mg by mouth 3 (three) times daily. 06/02/17   [provider]  hydrocortisone cream 1 % Apply  topically 3 (three) times daily. 04/20/17   Marguerita Merles Latif, DO  hydrOXYzine (VISTARIL) 25 MG capsule Take 25 mg by mouth 3 (three) times daily as needed for anxiety or itching.  05/08/17   [provider]  lisdexamfetamine (VYVANSE) 70 MG capsule Take 1 capsule (70 mg total) by mouth daily. 05/06/17   Gwyneth Sprout, MD  Menthol-Methyl Salicylate (MUSCLE RUB) 10-15 % CREA Apply 1 application topically as needed for muscle pain. 04/20/17   Sheikh, Omair Latif, DO  modafinil (PROVIGIL) 200 MG tablet Take 200 mg by mouth 2 (two) times daily.  08/04/16   [provider]  nicotine (NICODERM CQ - DOSED IN MG/24 HOURS) 21 mg/24hr patch Place 1 patch (21 mg total) onto the skin daily. 04/21/17   Marguerita Merles Latif, DO  pantoprazole (PROTONIX) 40 MG tablet Take 1 tablet (40 mg total) by mouth daily. 12/30/15   Haney, Arlyss Repress A, MD  potassium chloride SA (K-DUR,KLOR-CON) 20 MEQ tablet Take 20 mEq by mouth as needed (low potassium).     [provider]  risperiDONE (RISPERDAL) 0.5 MG tablet Take  1 tablet (0.5 mg total) by mouth 3 (three) times daily as needed (agitation). Patient taking differently: Take 0.5 mg by mouth 2 (two) times daily.  02/06/16   Renne Musca, MD  rizatriptan (MAXALT) 5 MG tablet TK 1 T PO QD 06/14/17   [provider]  spironolactone (ALDACTONE) 100 MG tablet Take 1 tablet (100 mg total) by mouth daily. 11/23/16   Derwood Kaplan, MD  topiramate (TOPAMAX) 100 MG tablet Take 1 tablet (100 mg total) by mouth daily. Patient taking differently: Take 100 mg by mouth every evening.  01/07/16   [name], MD    Family History Family History  Problem Relation Age of Onset  . Arrhythmia Mother   . Heart disease Father   . Hypertension Father     Social History Social History   Tobacco Use  . Smoking status: Current Every Day Smoker    Packs/day: 1.00    Years: 35.00    Pack years: 35.00    Types: Cigarettes  . Smokeless tobacco:  Never Used  Substance Use Topics  . Alcohol use: Yes    Alcohol/week: 16.8 oz    Types: 28 Glasses of wine per week    Comment: A bottle of wine or more per day.  . Drug use: No     Allergies   Patient has no known allergies.   Review of Systems Review of Systems  Constitutional: Negative for fever.  Respiratory: Positive for cough, chest tightness and shortness of breath.   Cardiovascular: Positive for chest pain and palpitations.  Gastrointestinal: Positive for abdominal pain, diarrhea and nausea. Negative for blood in stool and vomiting.  Genitourinary: Negative for dysuria.  All other systems reviewed and are negative.    Physical Exam Updated Vital Signs BP 125/65 (BP Location: Right Arm)   Pulse 91   Temp 97.9 F (36.6 C) (Oral)   Resp 18   Ht 5\' 2"  (1.575 m)   Wt 79.4 kg (175 lb)   SpO2 95%   BMI 32.01 kg/m   Physical Exam  Constitutional: She is oriented to person, place, and time. She appears well-developed and well-nourished. No distress.  Calm and cooperative  HENT:  Head: Normocephalic and atraumatic.  Eyes: Pupils are equal, round, and reactive to light. Conjunctivae are normal. Right eye exhibits no discharge. Left eye exhibits no discharge. No scleral icterus.  Neck: Normal range of motion.  Cardiovascular: Normal rate and regular rhythm.  Pulmonary/Chest: Effort normal and breath sounds normal. No respiratory distress.  Abdominal: Soft. Bowel sounds are normal. She exhibits no distension. There is tenderness (epigastric tenderness).  Moderate sized chronic wound over periumbilical area  Genitourinary:  Genitourinary Comments: Rectal: Large macerated erythematous and tender area over the buttocks and rectum  Neurological: She is alert and oriented to person, place, and time.  Skin: Skin is warm and dry.  Psychiatric: Her speech is normal and behavior is normal. Her mood appears anxious. Her affect is labile. She expresses impulsivity.  Nursing note  and vitals reviewed.    ED Treatments / Results  Labs (all labs ordered are listed, but only abnormal results are displayed) Labs Reviewed  CBC WITH DIFFERENTIAL/PLATELET - Abnormal; Notable for the following components:      Result Value   RDW 18.0 (*)    Lymphs Abs 0.5 (*)    All other components within normal limits  COMPREHENSIVE METABOLIC PANEL - Abnormal; Notable for the following components:   Sodium 146 (*)  Glucose, Bld 108 (*)    BUN 5 (*)    Creatinine, Ser 0.41 (*)    Calcium 8.7 (*)    Total Protein 6.3 (*)    Alkaline Phosphatase 151 (*)    All other components within normal limits  URINALYSIS, ROUTINE W REFLEX MICROSCOPIC - Abnormal; Notable for the following components:   APPearance HAZY (*)    All other components within normal limits  ETHANOL - Abnormal; Notable for the following components:   Alcohol, Ethyl (B) 263 (*)    All other components within normal limits  C DIFFICILE QUICK SCREEN W PCR REFLEX  LIPASE, BLOOD  I-STAT TROPONIN, ED  POC OCCULT BLOOD, ED    EKG EKG Interpretation  Date/Time:  Sunday July 10 2017 11:06:01 EDT Ventricular Rate:  81 PR Interval:    QRS Duration: 95 QT Interval:  411 QTC Calculation: 478 R Axis:   83 Text Interpretation:  unclear rhythm due to artefact.  QRS morphology is similar to prior substantial wander and artefact Abnormal ECG Confirmed by Gerhard Munch [id number]) on 07/10/2017 11:33:00 AM   Radiology No results found.  Procedures Procedures (including critical care time)  Medications Ordered in ED Medications  HYDROcodone-acetaminophen (NORCO/VICODIN) 5-325 MG per tablet 1 tablet (1 tablet Oral Given 07/10/17 1321)  loperamide (IMODIUM) capsule 2 mg (2 mg Oral Given 07/10/17 1417)     Initial Impression / Assessment and Plan / ED Course  I have reviewed the triage vital signs and the nursing notes.  Pertinent labs & imaging results that were available during my care of the patient were reviewed by  me and considered in my medical decision making (see chart for details).  57 year old female presents with multiple complaints.  She seems to mostly be concerned with coming into the hospital to go through detox.  She is initially very cooperative however throughout her ED stay she has been demanding with nursing staff and unwilling to stay for a full evaluation.  Her vital signs are normal.  Heart is regular rate and rhythm.  Lungs are clear to auscultation.  Her abdomen is nontender.  She has not had any diarrhea despite her claims that she has had profuse diarrhea at home.  I advised her that we will check blood work and I will talk to social work to see what options we have for her.  1:34 PM Told by nursing that the patient wants to leave. Went to go talk with her. She is upset because she's been waiting (at this point she's been in the ED for 2.5 hours). I explained to her that her blood work is back and looks okay that if she wants help with her alcohol problem she will have to wait for SW. We are also still waiting on a stool sample since she claims she is having such profuse diarrhea. She states that she is in pain from her bottom being raw and she is having shakes and wants to drink. I advised her that I will order a dose of pain medicine for her but she is probably not withdrawing since her alcohol level is 263. She is agreeable to staying to talk to SW.   Patient required redirection multiple times please see nursing notes for details.  It seems that the patient is mostly attention seeking as she is asking for a sitter so she have someone to talk to.  Patient is not actively withdrawing or hallucinating. She did speak with social work. Ultimately the patient eloped prior  to my re-evaluation.  Final Clinical Impressions(s) / ED Diagnoses   Final diagnoses:  Alcohol abuse  Hypernatremia  Diarrhea, unspecified type  Failure to thrive in adult    ED Discharge Orders    None             Bethel Born, PA-C 07/10/17 1557    Gerhard Munch, MD 07/10/17 [phone]

## 2017-07-10 NOTE — ED Notes (Signed)
[employer] cab voucher provided for pt.

## 2017-07-10 NOTE — Clinical Social Work Note (Addendum)
CSW received social work consult for [phone]." CSW received call from [facility] stating patient needs substance use (SU) resources. CSW attempted to meet with patient, but patient in bathroom. CSW met with patient when back in room. CSW introduced self and explained purpose for CSW visit. Patient began questioning why she is not being admitted. CSW explained she would need to discuss that with MD/RN as they make that decision. CSW redirected conversation to CSW consult. CSW attempted to discuss patient's alcohol usage and patient continued to question why she is not being admitted and stated she needs to speak with the doctor. CSW provided patient with inpatient and outpatient ubstance use resources, with which patient acknowledged she is familiar. CSW explained process of seeking inpatient SU rehab. Patient acknowledging CSW explanation. However, CSW doubtful patient is listening/comprehnding, as she continues to focus on speaking to a doctor and getting Imodium. As CSW was leaving the room, patient asked if she can get a cab voucher. Patient stated she has no money left and no family or friends willing to pick her up. CSW updated EDRN Patty, ED Tech, and EDP. CSW provided cab voucher to [employer]. CSW signing off as no further Social Work needs identified.   Oretha Ellis, [place], [place] [facility] Social Work [phone]

## 2017-07-10 NOTE — ED Notes (Signed)
Pt asked this nurse to stay in the room. Pt informed that RN needed to take care of other pts. Pt requested sitter at bedside. This RN asked why, with pt replying, I am lonely and in pain. I need someone to keep me company and talk to. This RN encouraged pt to use the phone to call family members, and this RN would return with her pain medicine.

## 2017-07-10 NOTE — ED Notes (Signed)
While this RN was triaging another pt, Ms Harju came into the hallway, screaming for help. Pt had dropped pants, laid sheet on a chair in the room, and defecated a small amount onto said sheet. No feces noted on scrubs. Pt demanded help cleaning herself. Pt asked why she did not go to the restroom, pt states that it is too painful. Pt informed it is not appropriate to defecate on a chair in the room. Pt then asked for help wiping. Pt reminded of her indepence, and then wiped herself. Pt then demanded this RN to put a diaper on her. Diaper placed on pt. Pt then dressed herself.

## 2017-07-15 ENCOUNTER — Telehealth: Payer: Self-pay | Admitting: Family Medicine

## 2017-07-15 NOTE — Telephone Encounter (Signed)
Copied from CRM [id number]. Topic: Quick Communication - See Telephone Encounter >> Jul 15, 2017  3:32 PM Eston Mould B wrote: CRM for notification. See Telephone encounter for: 07/15/17.  PT is asking if she can get something called in to the pharmacy for rash  on her bottom caused by diarrhea  She also states she has urine incontinence  And refueses to make an apt  [id number] - [place], [place] - [address] [phone] (Phone) [phone] (Fax)

## 2017-07-23 ENCOUNTER — Encounter (HOSPITAL_COMMUNITY): Payer: Self-pay

## 2017-07-23 ENCOUNTER — Emergency Department (HOSPITAL_COMMUNITY)
Admission: EM | Admit: 2017-07-23 | Discharge: 2017-07-24 | Disposition: A | Discharge status: Home / Self Care | Payer: Medicare Other | Attending: Emergency Medicine | Admitting: Emergency Medicine

## 2017-07-23 ENCOUNTER — Other Ambulatory Visit: Payer: Self-pay

## 2017-07-23 DIAGNOSIS — Z5321 Procedure and treatment not carried out due to patient leaving prior to being seen by health care provider: Secondary | ICD-10-CM | POA: Diagnosis not present

## 2017-07-23 DIAGNOSIS — F10129 Alcohol abuse with intoxication, unspecified: Secondary | ICD-10-CM | POA: Diagnosis present

## 2017-07-23 DIAGNOSIS — R45851 Suicidal ideations: Secondary | ICD-10-CM | POA: Diagnosis not present

## 2017-07-23 NOTE — ED Triage Notes (Signed)
Patient states she is an alcoholic. Patient brought in by EMS. Patient stated she could not move and needed to go to the hospital. Patient stated to EMS that she wanted to kill herself. Patient walked to EMS stretcher on the EMS truck. Patient got to the ED and stated she wanted to go back home. Patient cussing and swearing at people. Patient states that she is not worthy.

## 2017-07-23 NOTE — ED Notes (Signed)
Pt states "I take it back, I dont want to kill myself." "I was joking." "Can you call me a cab."

## 2017-07-23 NOTE — ED Notes (Signed)
Bed: [facility] Expected date:  Expected time:  Means of arrival:  Comments: 

## 2017-07-24 NOTE — ED Notes (Signed)
Patient walked out and states she was going home.

## 2017-07-27 ENCOUNTER — Telehealth: Payer: Self-pay | Admitting: Family Medicine

## 2017-07-27 NOTE — Telephone Encounter (Signed)
Copied from CRM [id number]. Topic: Inquiry >> Jul 27, 2017 11:26 AM Anne Black wrote: Reason for CRM: Patient has requested an FL2 as soon as possible. Patient is not able to take care of her self and needs the [facility] for a facility. Please call the patient at [phone].         Thank You!!!

## 2017-07-29 ENCOUNTER — Ambulatory Visit: Payer: Self-pay | Admitting: *Deleted

## 2017-07-29 ENCOUNTER — Telehealth: Payer: Self-pay | Admitting: Family Medicine

## 2017-07-29 NOTE — Telephone Encounter (Signed)
Phone message sent to Dr. Creta Levin re: bad pain Pt also has rx request note in box.  Will send to [facility]

## 2017-07-29 NOTE — Telephone Encounter (Signed)
Pt called to report "Bad pain.I have MS" When asked location of pain pt stated "All over my body, I have MS what do you think?" States balance is off, fearful of falling. Speech slurred, audible wheezing. Instructed to go to ED. States "That is what Im planning to do."  States "I'm going to call 911 or have my daughter take me." Asking for "Home health to give her steroids." When reiterated need to go to ED and the fact I could not RX medications pt hung up. Reason for Disposition . [1] Angry or rude caller AND [2] doesn't respond to 5 minutes of triager counseling AND [3] sick adult (or caller)  Answer Assessment - Initial Assessment Questions 1. SITUATION:  Document reason for call.     Pain "all over" 2. BACKGROUND: Document any background information (e.g., prior calls, known psychiatric history)     ETOH, MS 3. ASSESSMENT: Document your nursing assessment.     ED 4. RESPONSE: Document what your response or recommendation was.     "That's what I planned on doing." Pt hung up  Protocols used: DIFFICULT CALLER-A-AH

## 2017-07-29 NOTE — Telephone Encounter (Signed)
Copied from CRM [id number]. Topic: Quick Communication - Rx Refill/Question >> Jul 29, 2017  2:33 PM Oneal Grout wrote: Medication: lisdexamfetamine (VYVANSE) 70 MG capsule, gabapentin (NEURONTIN) 100 MG capsule and modafinil (PROVIGIL) 200 MG tablet   Has the patient contacted their pharmacy? Yes.   (Agent: If no, request that the patient contact the pharmacy for the refill.) (Agent: If yes, when and what did the pharmacy advise?)  Preferred Pharmacy (with phone number or street name): [facility] [place]  Agent: Please be advised that RX refills may take up to 3 business days. We ask that you follow-up with your pharmacy.

## 2017-07-29 NOTE — Telephone Encounter (Signed)
Message sent to Dr. Creta Levin - can you complete an FL2? Attempted to call pt - no answer.

## 2017-07-29 NOTE — Telephone Encounter (Signed)
Message for Vyvanse Neurontin and Provigil sent to Dr. Creta Levin

## 2017-08-01 NOTE — Telephone Encounter (Signed)
noted 

## 2017-08-01 NOTE — Telephone Encounter (Signed)
I will not refill her medications without an office visit.  Please let the patient know she cannot get any further refills.

## 2017-08-02 NOTE — Telephone Encounter (Signed)
Called patient no voice mail

## 2017-08-19 ENCOUNTER — Emergency Department (HOSPITAL_COMMUNITY)
Admission: EM | Admit: 2017-08-19 | Discharge: 2017-08-20 | Disposition: A | Discharge status: Home / Self Care | Payer: Medicare Other | Attending: Emergency Medicine | Admitting: Emergency Medicine

## 2017-08-19 DIAGNOSIS — Z5321 Procedure and treatment not carried out due to patient leaving prior to being seen by health care provider: Secondary | ICD-10-CM | POA: Insufficient documentation

## 2017-08-19 DIAGNOSIS — R531 Weakness: Secondary | ICD-10-CM | POA: Diagnosis present

## 2017-08-19 MED ORDER — DILTIAZEM HCL ER COATED BEADS 120 MG PO CP24
120.00 | ORAL_CAPSULE | ORAL | Status: DC
Start: 2017-08-20 — End: 2017-08-19

## 2017-08-19 MED ORDER — GENERIC EXTERNAL MEDICATION
0.20 | Status: DC
Start: ? — End: 2017-08-19

## 2017-08-19 MED ORDER — SIMETHICONE 80 MG PO CHEW
80.00 | CHEWABLE_TABLET | ORAL | Status: DC
Start: ? — End: 2017-08-19

## 2017-08-19 MED ORDER — BUDESONIDE-FORMOTEROL FUMARATE 160-4.5 MCG/ACT IN AERO
2.00 | INHALATION_SPRAY | RESPIRATORY_TRACT | Status: DC
Start: 2017-08-19 — End: 2017-08-19

## 2017-08-19 MED ORDER — ENOXAPARIN SODIUM 40 MG/0.4ML ~~LOC~~ SOLN
40.00 | SUBCUTANEOUS | Status: DC
Start: 2017-08-20 — End: 2017-08-19

## 2017-08-19 MED ORDER — ONDANSETRON HCL 4 MG/2ML IJ SOLN
4.00 | INTRAMUSCULAR | Status: DC
Start: ? — End: 2017-08-19

## 2017-08-19 MED ORDER — LEVOFLOXACIN IN D5W 500 MG/100ML IV SOLN
500.00 | INTRAVENOUS | Status: DC
Start: 2017-08-20 — End: 2017-08-19

## 2017-08-19 MED ORDER — RISPERIDONE 0.5 MG PO TABS
.50 | ORAL_TABLET | ORAL | Status: DC
Start: ? — End: 2017-08-19

## 2017-08-19 MED ORDER — OXYCODONE HCL 5 MG PO TABS
5.00 | ORAL_TABLET | ORAL | Status: DC
Start: ? — End: 2017-08-19

## 2017-08-19 MED ORDER — GENERIC EXTERNAL MEDICATION
70.00 | Status: DC
Start: 2017-08-20 — End: 2017-08-19

## 2017-08-19 MED ORDER — GENERIC EXTERNAL MEDICATION
Status: DC
Start: ? — End: 2017-08-19

## 2017-08-19 MED ORDER — LEVALBUTEROL HCL 0.63 MG/3ML IN NEBU
0.63 | INHALATION_SOLUTION | RESPIRATORY_TRACT | Status: DC
Start: 2017-08-19 — End: 2017-08-19

## 2017-08-19 MED ORDER — NALOXONE HCL 0.4 MG/ML IJ SOLN
0.04 | INTRAMUSCULAR | Status: DC
Start: ? — End: 2017-08-19

## 2017-08-19 MED ORDER — GENERIC EXTERNAL MEDICATION
4.00 | Status: DC
Start: ? — End: 2017-08-19

## 2017-08-19 MED ORDER — FOLIC ACID 1 MG PO TABS
1.00 | ORAL_TABLET | ORAL | Status: DC
Start: 2017-08-20 — End: 2017-08-19

## 2017-08-19 MED ORDER — FLUOXETINE HCL 20 MG PO CAPS
20.00 | ORAL_CAPSULE | ORAL | Status: DC
Start: 2017-08-20 — End: 2017-08-19

## 2017-08-19 MED ORDER — SPIRONOLACTONE 25 MG PO TABS
100.00 | ORAL_TABLET | ORAL | Status: DC
Start: 2017-08-20 — End: 2017-08-19

## 2017-08-19 MED ORDER — POLYETHYLENE GLYCOL 3350 17 G PO PACK
17.00 | PACK | ORAL | Status: DC
Start: ? — End: 2017-08-19

## 2017-08-19 MED ORDER — GABAPENTIN 100 MG PO CAPS
200.00 | ORAL_CAPSULE | ORAL | Status: DC
Start: 2017-08-19 — End: 2017-08-19

## 2017-08-19 MED ORDER — HYDRALAZINE HCL 20 MG/ML IJ SOLN
10.00 | INTRAMUSCULAR | Status: DC
Start: ? — End: 2017-08-19

## 2017-08-19 MED ORDER — PANTOPRAZOLE SODIUM 40 MG PO TBEC
40.00 | DELAYED_RELEASE_TABLET | ORAL | Status: DC
Start: 2017-08-20 — End: 2017-08-19

## 2017-08-19 MED ORDER — ONE DAILY PLUS MINERALS PO TABS
1.00 | ORAL_TABLET | ORAL | Status: DC
Start: 2017-08-20 — End: 2017-08-19

## 2017-08-19 MED ORDER — NICOTINE 14 MG/24HR TD PT24
1.00 | MEDICATED_PATCH | TRANSDERMAL | Status: DC
Start: 2017-08-20 — End: 2017-08-19

## 2017-08-19 MED ORDER — FUROSEMIDE 40 MG PO TABS
40.00 | ORAL_TABLET | ORAL | Status: DC
Start: 2017-08-20 — End: 2017-08-19

## 2017-08-19 MED ORDER — THIAMINE HCL 100 MG PO TABS
100.00 | ORAL_TABLET | ORAL | Status: DC
Start: 2017-08-20 — End: 2017-08-19

## 2017-08-19 MED ORDER — LACTULOSE 10 GM/15ML PO SOLN
10.00 | ORAL | Status: DC
Start: 2017-08-19 — End: 2017-08-19

## 2017-08-19 MED ORDER — ALUMINUM-MAGNESIUM-SIMETHICONE 200-200-20 MG/5ML PO SUSP
15.00 | ORAL | Status: DC
Start: ? — End: 2017-08-19

## 2017-08-19 MED ORDER — PREDNISONE 20 MG PO TABS
20.00 | ORAL_TABLET | ORAL | Status: DC
Start: 2017-08-20 — End: 2017-08-19

## 2017-08-19 MED ORDER — RIFAXIMIN 550 MG PO TABS
550.00 | ORAL_TABLET | ORAL | Status: DC
Start: 2017-08-19 — End: 2017-08-19

## 2017-08-19 NOTE — ED Notes (Signed)
Bed: [facility] Expected date:  Expected time:  Means of arrival:  Comments: EMS 57 yo female weakness

## 2017-08-19 NOTE — ED Triage Notes (Signed)
Patient presents with a complaint of weakness. EMS reports being called out to the patient's residence multiple times today. They report patient arrived back home from a facility today and has developed weakness due to very little movement. She has noticed this weakness for a week. It was reported that the patient slid off the couch to the floor twice today. She is A&Ox4 and suffers from chronic low back pain

## 2017-08-20 ENCOUNTER — Encounter (HOSPITAL_COMMUNITY): Payer: Self-pay | Admitting: Emergency Medicine

## 2017-08-20 ENCOUNTER — Other Ambulatory Visit: Payer: Self-pay

## 2017-08-20 NOTE — ED Notes (Signed)
Pt states that she does not want to be here anymore. When asked why she called the ambulance, she states, "I don't know. It was a mistake. I want to go home."

## 2017-08-20 NOTE — ED Notes (Signed)
Cab called for patient as requested.

## 2017-08-21 ENCOUNTER — Inpatient Hospital Stay (HOSPITAL_COMMUNITY)
Admission: EM | Admit: 2017-08-21 | Discharge: 2017-08-23 | DRG: 392 | Disposition: A | Discharge status: Home / Self Care | Payer: No Typology Code available for payment source | Attending: Internal Medicine | Admitting: Internal Medicine

## 2017-08-21 ENCOUNTER — Emergency Department (HOSPITAL_COMMUNITY): Payer: No Typology Code available for payment source

## 2017-08-21 DIAGNOSIS — K7031 Alcoholic cirrhosis of liver with ascites: Secondary | ICD-10-CM | POA: Diagnosis present

## 2017-08-21 DIAGNOSIS — M545 Low back pain: Secondary | ICD-10-CM | POA: Diagnosis present

## 2017-08-21 DIAGNOSIS — E876 Hypokalemia: Secondary | ICD-10-CM | POA: Diagnosis present

## 2017-08-21 DIAGNOSIS — G47419 Narcolepsy without cataplexy: Secondary | ICD-10-CM | POA: Diagnosis present

## 2017-08-21 DIAGNOSIS — A09 Infectious gastroenteritis and colitis, unspecified: Secondary | ICD-10-CM | POA: Diagnosis present

## 2017-08-21 DIAGNOSIS — I248 Other forms of acute ischemic heart disease: Secondary | ICD-10-CM | POA: Diagnosis present

## 2017-08-21 DIAGNOSIS — R531 Weakness: Secondary | ICD-10-CM

## 2017-08-21 DIAGNOSIS — N390 Urinary tract infection, site not specified: Secondary | ICD-10-CM

## 2017-08-21 DIAGNOSIS — Y838 Other surgical procedures as the cause of abnormal reaction of the patient, or of later complication, without mention of misadventure at the time of the procedure: Secondary | ICD-10-CM | POA: Diagnosis present

## 2017-08-21 DIAGNOSIS — T8189XA Other complications of procedures, not elsewhere classified, initial encounter: Secondary | ICD-10-CM | POA: Diagnosis present

## 2017-08-21 DIAGNOSIS — F319 Bipolar disorder, unspecified: Secondary | ICD-10-CM | POA: Diagnosis present

## 2017-08-21 DIAGNOSIS — F17210 Nicotine dependence, cigarettes, uncomplicated: Secondary | ICD-10-CM | POA: Diagnosis present

## 2017-08-21 DIAGNOSIS — F909 Attention-deficit hyperactivity disorder, unspecified type: Secondary | ICD-10-CM | POA: Diagnosis present

## 2017-08-21 DIAGNOSIS — G35 Multiple sclerosis: Secondary | ICD-10-CM | POA: Diagnosis present

## 2017-08-21 DIAGNOSIS — Z8249 Family history of ischemic heart disease and other diseases of the circulatory system: Secondary | ICD-10-CM | POA: Diagnosis not present

## 2017-08-21 DIAGNOSIS — J449 Chronic obstructive pulmonary disease, unspecified: Secondary | ICD-10-CM | POA: Diagnosis present

## 2017-08-21 DIAGNOSIS — Z9089 Acquired absence of other organs: Secondary | ICD-10-CM

## 2017-08-21 DIAGNOSIS — Z79899 Other long term (current) drug therapy: Secondary | ICD-10-CM | POA: Diagnosis not present

## 2017-08-21 DIAGNOSIS — Z7951 Long term (current) use of inhaled steroids: Secondary | ICD-10-CM | POA: Diagnosis not present

## 2017-08-21 DIAGNOSIS — D539 Nutritional anemia, unspecified: Secondary | ICD-10-CM | POA: Diagnosis present

## 2017-08-21 DIAGNOSIS — M810 Age-related osteoporosis without current pathological fracture: Secondary | ICD-10-CM | POA: Diagnosis present

## 2017-08-21 DIAGNOSIS — R630 Anorexia: Secondary | ICD-10-CM | POA: Diagnosis present

## 2017-08-21 DIAGNOSIS — F1020 Alcohol dependence, uncomplicated: Secondary | ICD-10-CM | POA: Diagnosis present

## 2017-08-21 DIAGNOSIS — G8929 Other chronic pain: Secondary | ICD-10-CM | POA: Diagnosis present

## 2017-08-21 DIAGNOSIS — Z9119 Patient's noncompliance with other medical treatment and regimen: Secondary | ICD-10-CM | POA: Diagnosis not present

## 2017-08-21 DIAGNOSIS — I5030 Unspecified diastolic (congestive) heart failure: Secondary | ICD-10-CM | POA: Diagnosis not present

## 2017-08-21 DIAGNOSIS — R748 Abnormal levels of other serum enzymes: Secondary | ICD-10-CM | POA: Diagnosis not present

## 2017-08-21 DIAGNOSIS — R778 Other specified abnormalities of plasma proteins: Secondary | ICD-10-CM

## 2017-08-21 DIAGNOSIS — R7989 Other specified abnormal findings of blood chemistry: Secondary | ICD-10-CM

## 2017-08-21 LAB — URINALYSIS, ROUTINE W REFLEX MICROSCOPIC
Bilirubin Urine: NEGATIVE
GLUCOSE, UA: NEGATIVE mg/dL
Hgb urine dipstick: NEGATIVE
Ketones, ur: NEGATIVE mg/dL
NITRITE: NEGATIVE
PROTEIN: NEGATIVE mg/dL
Specific Gravity, Urine: 1.01 (ref 1.005–1.030)
pH: 6 (ref 5.0–8.0)

## 2017-08-21 LAB — CBC WITH DIFFERENTIAL/PLATELET
Abs Immature Granulocytes: 0.2 10*3/uL — ABNORMAL HIGH (ref 0.0–0.1)
BASOS ABS: 0.1 10*3/uL (ref 0.0–0.1)
Basophils Relative: 0 %
EOS PCT: 0 %
Eosinophils Absolute: 0 10*3/uL (ref 0.0–0.7)
HCT: 39.6 % (ref 36.0–46.0)
HEMOGLOBIN: 11.7 g/dL — AB (ref 12.0–15.0)
Immature Granulocytes: 1 %
LYMPHS PCT: 4 %
Lymphs Abs: 0.7 10*3/uL (ref 0.7–4.0)
MCH: 27.2 pg (ref 26.0–34.0)
MCHC: 29.5 g/dL — ABNORMAL LOW (ref 30.0–36.0)
MCV: 92.1 fL (ref 78.0–100.0)
MONO ABS: 1 10*3/uL (ref 0.1–1.0)
Monocytes Relative: 5 %
Neutro Abs: 18 10*3/uL — ABNORMAL HIGH (ref 1.7–7.7)
Neutrophils Relative %: 90 %
Platelets: 170 10*3/uL (ref 150–400)
RBC: 4.3 MIL/uL (ref 3.87–5.11)
RDW: 16.8 % — AB (ref 11.5–15.5)
WBC: 20.1 10*3/uL — ABNORMAL HIGH (ref 4.0–10.5)

## 2017-08-21 LAB — COMPREHENSIVE METABOLIC PANEL
ALBUMIN: 2.9 g/dL — AB (ref 3.5–5.0)
ALK PHOS: 102 U/L (ref 38–126)
ALT: 25 U/L (ref 0–44)
AST: 21 U/L (ref 15–41)
Anion gap: 10 (ref 5–15)
BILIRUBIN TOTAL: 1.2 mg/dL (ref 0.3–1.2)
BUN: <5 mg/dL — ABNORMAL LOW (ref 6–20)
CALCIUM: 7.7 mg/dL — AB (ref 8.9–10.3)
CO2: 24 mmol/L (ref 22–32)
CREATININE: 0.37 mg/dL — AB (ref 0.44–1.00)
Chloride: 103 mmol/L (ref 98–111)
GFR calc Af Amer: >60 mL/min (ref >60)
GFR calc non Af Amer: >60 mL/min (ref >60)
Glucose, Bld: 82 mg/dL (ref 70–99)
Potassium: 2.9 mmol/L — ABNORMAL LOW (ref 3.5–5.1)
SODIUM: 137 mmol/L (ref 135–145)
TOTAL PROTEIN: 5.3 g/dL — AB (ref 6.5–8.1)

## 2017-08-21 LAB — TROPONIN I: Troponin I: 0.05 ng/mL (ref <0.03)

## 2017-08-21 LAB — CBC
HCT: 37 % (ref 36.0–46.0)
HEMOGLOBIN: 11.1 g/dL — AB (ref 12.0–15.0)
MCH: 27.5 pg (ref 26.0–34.0)
MCHC: 30 g/dL (ref 30.0–36.0)
MCV: 91.8 fL (ref 78.0–100.0)
PLATELETS: 166 10*3/uL (ref 150–400)
RBC: 4.03 MIL/uL (ref 3.87–5.11)
RDW: 16.6 % — ABNORMAL HIGH (ref 11.5–15.5)
WBC: 20.4 10*3/uL — ABNORMAL HIGH (ref 4.0–10.5)

## 2017-08-21 LAB — CREATININE, SERUM
CREATININE: 0.49 mg/dL (ref 0.44–1.00)
GFR calc Af Amer: >60 mL/min (ref >60)

## 2017-08-21 MED ORDER — FOLIC ACID 1 MG PO TABS
1.0000 mg | ORAL_TABLET | Freq: Every day | ORAL | Status: DC
  Administered 2017-08-21 – 2017-08-23 (×3): 1 mg via ORAL
  Filled 2017-08-21 (×3): qty 1

## 2017-08-21 MED ORDER — LORAZEPAM 1 MG PO TABS
1.0000 mg | ORAL_TABLET | Freq: Four times a day (QID) | ORAL | Status: DC | PRN
  Administered 2017-08-22 – 2017-08-23 (×2): 1 mg via ORAL
  Filled 2017-08-21 (×2): qty 1

## 2017-08-21 MED ORDER — SODIUM CHLORIDE 0.9 % IV SOLN
1.0000 g | Freq: Once | INTRAVENOUS | Status: AC
  Administered 2017-08-21: 1 g via INTRAVENOUS
  Filled 2017-08-21: qty 10

## 2017-08-21 MED ORDER — ACETAMINOPHEN 500 MG PO TABS
1000.0000 mg | ORAL_TABLET | Freq: Once | ORAL | Status: AC
  Administered 2017-08-21: 1000 mg via ORAL
  Filled 2017-08-21: qty 2

## 2017-08-21 MED ORDER — FLUOXETINE HCL 20 MG PO CAPS
20.0000 mg | ORAL_CAPSULE | Freq: Every day | ORAL | Status: DC
  Administered 2017-08-22 – 2017-08-23 (×2): 20 mg via ORAL
  Filled 2017-08-21 (×2): qty 1

## 2017-08-21 MED ORDER — IPRATROPIUM-ALBUTEROL 0.5-2.5 (3) MG/3ML IN SOLN: 3.0000 mL | Freq: Four times a day (QID) | RESPIRATORY_TRACT | Status: DC | PRN

## 2017-08-21 MED ORDER — GABAPENTIN 300 MG PO CAPS
300.0000 mg | ORAL_CAPSULE | Freq: Two times a day (BID) | ORAL | Status: DC
  Administered 2017-08-21 – 2017-08-23 (×4): 300 mg via ORAL
  Filled 2017-08-21 (×4): qty 1

## 2017-08-21 MED ORDER — VITAMIN B-1 100 MG PO TABS
100.0000 mg | ORAL_TABLET | Freq: Every day | ORAL | Status: DC
  Administered 2017-08-21 – 2017-08-23 (×3): 100 mg via ORAL
  Filled 2017-08-21 (×3): qty 1

## 2017-08-21 MED ORDER — MOMETASONE FURO-FORMOTEROL FUM 100-5 MCG/ACT IN AERO
2.0000 | INHALATION_SPRAY | Freq: Two times a day (BID) | RESPIRATORY_TRACT | Status: DC
  Administered 2017-08-22: 2 via RESPIRATORY_TRACT
  Filled 2017-08-21: qty 8.8

## 2017-08-21 MED ORDER — POTASSIUM CHLORIDE 10 MEQ/100ML IV SOLN
10.0000 meq | INTRAVENOUS | Status: AC
  Administered 2017-08-21: 10 meq via INTRAVENOUS

## 2017-08-21 MED ORDER — ENOXAPARIN SODIUM 40 MG/0.4ML ~~LOC~~ SOLN
40.0000 mg | SUBCUTANEOUS | Status: DC
  Administered 2017-08-22 – 2017-08-23 (×2): 40 mg via SUBCUTANEOUS
  Filled 2017-08-21 (×2): qty 0.4

## 2017-08-21 MED ORDER — POTASSIUM CITRATE ER 10 MEQ (1080 MG) PO TBCR
40.0000 meq | EXTENDED_RELEASE_TABLET | Freq: Once | ORAL | Status: DC
  Filled 2017-08-21: qty 4

## 2017-08-21 MED ORDER — THIAMINE HCL 100 MG/ML IJ SOLN
100.0000 mg | Freq: Every day | INTRAMUSCULAR | Status: DC
  Filled 2017-08-21: qty 2

## 2017-08-21 MED ORDER — SODIUM CHLORIDE 0.9 % IV BOLUS
500.0000 mL | Freq: Once | INTRAVENOUS | Status: AC
  Administered 2017-08-21: 500 mL via INTRAVENOUS

## 2017-08-21 MED ORDER — ADULT MULTIVITAMIN W/MINERALS CH
1.0000 | ORAL_TABLET | Freq: Every day | ORAL | Status: DC
  Administered 2017-08-21 – 2017-08-23 (×3): 1 via ORAL
  Filled 2017-08-21 (×3): qty 1

## 2017-08-21 MED ORDER — POTASSIUM CHLORIDE 10 MEQ/100ML IV SOLN
10.0000 meq | INTRAVENOUS | Status: AC
  Administered 2017-08-21: 10 meq via INTRAVENOUS
  Filled 2017-08-21 (×2): qty 100

## 2017-08-21 MED ORDER — TOPIRAMATE 25 MG PO TABS
100.0000 mg | ORAL_TABLET | Freq: Every evening | ORAL | Status: DC
  Administered 2017-08-22 (×2): 100 mg via ORAL
  Filled 2017-08-21 (×2): qty 4

## 2017-08-21 MED ORDER — LORAZEPAM 2 MG/ML IJ SOLN
1.0000 mg | Freq: Four times a day (QID) | INTRAMUSCULAR | Status: DC | PRN
  Administered 2017-08-21: 1 mg via INTRAVENOUS
  Filled 2017-08-21: qty 1

## 2017-08-21 MED ORDER — RISPERIDONE 0.5 MG PO TABS
0.5000 mg | ORAL_TABLET | Freq: Two times a day (BID) | ORAL | Status: DC
  Administered 2017-08-22 – 2017-08-23 (×4): 0.5 mg via ORAL
  Filled 2017-08-21 (×4): qty 1

## 2017-08-21 NOTE — H&P (Signed)
History and Physical  Anne Black ZOX:[id number] DOB: 04/22/1960 DOA: 08/21/2017 1656  Referring physician: n/a PCP: Doristine Bosworth, MD  Outpatient Specialists: n/a  Chief Complaint: weakness  HPI: Anne Black is a 57 y.o. female with complex medical history including alcoholism, liver cirrhosis with ascites, COPD, MS, bipolar disorder who presents with weakness after recent extended hospitalization for COPD exacerbation. She was recently evaluated at [facility] ED for similar symptoms and was discharged from ED per her request. Initially endorsed several symptoms to ED provider including chest pain but mainly described diarrhea and generalized weakness. Reports she has been taking augmentin at home which was prescribed after a 2 week hospitalization for reported COPD exacerbation at [facility] -- note, patient is uncertain of details regarding her frequent hospital encounters in the past year.  Reports "explosive" diarrhea (loose watery) x 1 week. Unable to perform ADLs due to generalized weakness and fatigue. Poor appetite. Denies drinking alcohol in the past week. States that she had slid off her couch and fell onto her back from a sitting position several times. No loss of consciousness or syncope. Currently lives alone and is unable to rely on her family members for help at this time. Baseline intermittently productive cough with associated chest tightness, but reports overall breathing has improved since last month. Denies fevers/chills.   Review of Systems:  + loose diarrhea  + generalized weakness + no focal neuro motor weakness - denies syncope or LOC - not tarry, melanotic or bloody stools - no fevers/chills + baseline dry cough - no chest pain or dyspnea on exertion - no edema, PND, orthopnea - no nausea/vomiting;  - no dysuria, increased urinary frequency - no weight changes  Rest of systems reviewed are negative, except as per above history.   ED course: HR 102 BP 107/89  RR 18 SO2 98% on RA; Received ceftriaxone 1gm x 1; NS 500cc bolus x 1; K ordered x 2 doses  Past Medical History:  Diagnosis Date  . ADHD (attention deficit hyperactivity disorder)   . Alcoholism (HCC)   . Anxiety   . Arthritis   . Ascites   . Bipolar disorder (HCC)   . Cirrhosis (HCC)   . COPD (chronic obstructive pulmonary disease) (HCC)   . Hypokalemia   . Migraine   . Multiple sclerosis (HCC)   . Narcolepsy   . Osteoporosis    Past Surgical History:  Procedure Laterality Date  . ABDOMINAL WALL DEFECT REPAIR N/A 12/30/2016   Procedure: EXPLORATORY LAPAROTOMY WITH REPAIR ABDOMINAL WALL VENTRAL HERNIA;  Surgeon: [name], MD;  Location: [facility] OR;  Service: General;  Laterality: N/A;  . APPLICATION OF WOUND VAC N/A 12/30/2016   Procedure: APPLICATION OF WOUND VAC;  Surgeon: [name], MD;  Location: [facility] OR;  Service: General;  Laterality: N/A;  . CESAREAN SECTION  [phone]  . FRACTURE SURGERY    . HERNIA REPAIR    . MYRINGOTOMY WITH TUBE PLACEMENT Bilateral   . ORIF WRIST FRACTURE Left 09/09/2016   Procedure: OPEN REDUCTION INTERNAL FIXATION (ORIF) LEFT WRIST FRACTURE, LEFT CARPAL TUNNEL RELEASE;  Surgeon: Dominica Severin, MD;  Location: [facility] ORS;  Service: Orthopedics;  Laterality: Left;  . TONSILLECTOMY     Social History:  reports that she has been smoking cigarettes.  She has a 35.00 pack-year smoking history. She has never used smokeless tobacco. She reports that she drinks about 16.8 oz of alcohol per week. She reports that she does not use drugs.  No  Known Allergies  Family History  Problem Relation Age of Onset  . Arrhythmia Mother   . Heart disease Father   . Hypertension Father      Prior to Admission medications   Medication Sig Start Date End Date Taking? Authorizing Provider  albuterol (PROVENTIL HFA;VENTOLIN HFA) 108 (90 Base) MCG/ACT inhaler Inhale 2 puffs into the lungs every 6 (six) hours as needed for wheezing or shortness of breath.    Yes [provider]  amoxicillin-clavulanate (AUGMENTIN) 500-125 MG tablet Take 1 tablet by mouth 2 (two) times daily.   Yes [provider]  fluticasone (FLONASE) 50 MCG/ACT nasal spray Place 1 spray into both nostrils as needed for allergies or rhinitis.   Yes [provider]  Fluticasone-Salmeterol (ADVAIR) 100-50 MCG/DOSE AEPB Inhale 1-2 puffs 2 (two) times daily into the lungs.  11/05/14  Yes [provider]  gabapentin (NEURONTIN) 100 MG capsule Take 300-400 mg by mouth 2 (two) times daily.  06/02/17  Yes [provider]  ibuprofen (ADVIL,MOTRIN) 800 MG tablet Take 800 mg by mouth every 8 (eight) hours as needed (pain).  08/20/17  Yes [provider]  pantoprazole (PROTONIX) 40 MG tablet Take 1 tablet (40 mg total) by mouth daily. 12/30/15  Yes Haney, Alyssa A, MD  bismuth subsalicylate (PEPTO-BISMOL) 262 MG chewable tablet Chew 2 tablets (524 mg total) by mouth as needed for diarrhea or loose stools. 07/08/17   Molpus, John, MD  FLUoxetine (PROZAC) 20 MG capsule Take 20 mg by mouth daily.  03/30/16   [provider]  furosemide (LASIX) 40 MG tablet Take 1 tablet (40 mg total) by mouth daily. Patient not taking: Reported on 08/21/2017 06/17/16   Midge Minium, MD  hydrocortisone cream 1 % Apply topically 3 (three) times daily. 04/20/17   Marguerita Merles Latif, DO  hydrOXYzine (VISTARIL) 25 MG capsule Take 25 mg by mouth 3 (three) times daily as needed for anxiety or itching.  05/08/17   [provider]  lisdexamfetamine (VYVANSE) 70 MG capsule Take 1 capsule (70 mg total) by mouth daily. 05/06/17   Gwyneth Sprout, MD  Menthol-Methyl Salicylate (MUSCLE RUB) 10-15 % CREA Apply 1 application topically as needed for muscle pain. 04/20/17   Sheikh, Omair Latif, DO  modafinil (PROVIGIL) 200 MG tablet Take 200 mg by mouth 2 (two) times daily.  08/04/16   [provider]  nicotine (NICODERM CQ - DOSED IN MG/24 HOURS) 21 mg/24hr patch  Place 1 patch (21 mg total) onto the skin daily. Patient not taking: Reported on 08/21/2017 04/21/17   Marguerita Merles Latif, DO  potassium chloride SA (K-DUR,KLOR-CON) 20 MEQ tablet Take 20 mEq by mouth as needed (low potassium).     [provider]  risperiDONE (RISPERDAL) 0.5 MG tablet Take 1 tablet (0.5 mg total) by mouth 3 (three) times daily as needed (agitation). Patient taking differently: Take 0.5 mg by mouth 2 (two) times daily.  02/06/16   Renne Musca, MD  rizatriptan (MAXALT) 5 MG tablet TK 1 T PO QD 06/14/17   [provider]  spironolactone (ALDACTONE) 100 MG tablet Take 1 tablet (100 mg total) by mouth daily. Patient not taking: Reported on 08/20/2017 11/23/16   Derwood Kaplan, MD  topiramate (TOPAMAX) 100 MG tablet Take 1 tablet (100 mg total) by mouth daily. Patient taking differently: Take 100 mg by mouth every evening.  01/07/16   [name], MD    Pulse Rate:  [99-101] 99 (07/14 1941) Resp:  [16-21] 21 (07/14  1939) BP: (103-107)/(50-89) 103/50 (07/14 1932) SpO2:  [98 %-100 %] 100 % (07/14 1941) Weight:  [77.1 kg (170 lb)] 77.1 kg (170 lb) (07/14 1702)  PHYSICAL EXAM:  BP (!) 103/50   Pulse 99   Resp (!) 21   Ht 5\' 2"  (1.575 m)   Wt 77.1 kg (170 lb)   SpO2 100%   BMI 31.09 kg/m   GEN obese elderly caucasian female; resting comfortably in bed. AAOx 3, very tangential HEENT NCAT EOM PERRL; clear oropharynx, no cervical LAD; moist mucus membranes  JVP estimated 4-5 cm H2O above RA; no HJR ; no carotid bruits b/l ;  CV regular normal rate; normal S1 and S2; no m/r/g or S3/S4; PMI non displaced; no parasternal heave  RESP CTA b/l; breathing unlabored and symmetric  ABD soft NT ND obese; dressing over umbilicus (prior hernia repair ) clean/dry/intact; +normoactive BS  EXT warm throughout b/l; no peripheral edema b/l  PULSES  DP and radials intact 2+   SKIN/MSK  Multiple bruises and ecchymoses on upper arms; otherwise no rashes or lesions    NEURO/PSYCH no focal deficits   LABS ON ADMISSION:  Basic Metabolic Panel: Recent Labs  Lab 08/21/17 1850  NA 137  K 2.9*  CL 103  CO2 24  GLUCOSE 82  BUN <5*  CREATININE 0.37*  CALCIUM 7.7*   CBC: Recent Labs  Lab 08/21/17 1850  WBC 20.1*  NEUTROABS 18.0*  HGB 11.7*  HCT 39.6  MCV 92.1  PLT 170   Liver Function Tests: Recent Labs  Lab 08/21/17 1850  AST 21  ALT 25  ALKPHOS 102  BILITOT 1.2  PROT 5.3*  ALBUMIN 2.9*   No results for input(s): LIPASE, AMYLASE in the last 168 hours. No results for input(s): AMMONIA in the last 168 hours. Coagulation:  Lab Results  Component Value Date   INR 1.18 03/07/2017   INR 1.20 12/31/2016   INR 1.30 12/30/2016   No results found for: PTT Lactic Acid, Venous:     Component Value Date/Time   LATICACIDVEN 1.94 (H) 06/12/2017 1221   Cardiac Enzymes: Recent Labs  Lab 08/21/17 1850  TROPONINI 0.05*    BNP (last 3 results) No results for input(s): PROBNP in the last 8760 hours. CBG: No results for input(s): GLUCAP in the last 168 hours.  Radiological Exams on Admission: Dg Chest 2 View  Result Date: 08/21/2017 CLINICAL DATA:  from home with complaints of back pain, recent falls, nausea, HA, chest tightness. Pt was discharged Friday after spending 2 weeks in the hospital due to COPD EXAM: CHEST - 2 VIEW COMPARISON:  06/12/2017 FINDINGS: Cardiac silhouette is normal in size. No mediastinal or hilar masses. No evidence of adenopathy. Clear lungs. No pleural effusion or pneumothorax. Skeletal structures are intact. IMPRESSION: No active cardiopulmonary disease. Electronically Signed   By: Amie Portland M.D.   On: 08/21/2017 19:03   Dg Lumbar Spine Complete  Result Date: 08/21/2017 CLINICAL DATA:  from home with complaints of back pain, recent falls, nausea, HA, chest tightness. Pt was discharged Friday after spending 2 weeks in the hospital due to COPD EXAM: LUMBAR SPINE - COMPLETE 4+ VIEW COMPARISON:  CT, 06/12/2017  FINDINGS: No fracture. No spondylolisthesis. Mild loss of disc height at L4-L5. Minor endplate osteophytes from L2 through L5. Mild facet degenerative change in the lower lumbar spine. Scattered aortic atherosclerotic calcifications. Soft tissues are otherwise unremarkable. IMPRESSION: 1. No fracture or acute finding.  Mild degenerative changes. Electronically Signed   By:  Amie Portland M.D.   On: 08/21/2017 19:05    EKG: Independently reviewed. Sinus tachycardia at 100 bpm.   Assessment/Plan Anne Black is a 57 y.o. female with hx of alcoholism, liver cirrhosis with ascites, COPD, MS, bipolar disorder who presents with weakness after recent extended hospitalization for COPD exacerbation. Recently seen in ED a few days ago for similar symptoms -- she was discharged home from ED per her request. Suspect deconditioning is major cause of her symptoms although she is also reporting diarrhea, which is new, and in setting of recent antibiotic use and with leukocytosis + hypotension strong suspicion for c diff infection. Of note, patient states she is now interested in going to SNF/rehab facility to regain her strength.   # Generalized weakness suspect deconditioning and possible ongoing GI infection. Reported hx of MS but no clear evidence of flare.   - gentle fluids  - c diff workup as below  - PT/OT and SW consult ordered  - hold off on restarting spironolactone or other diuretics given hypotension  # Diarrhea x 1 week and hypotension. Recent abx use and hospitalizations  - c diff panel ordered  - start PO vancomycin pending c diff test collection  - small fluid boluses prn for MAP goal > 65  # Hypokalemia - acute on chronic with K 2.9 on admission likely related to GI losses  - receiving PO and IV repletion overnight total 70 mEq  - repeat check in AM  # Concern for UTI -- UA is not convincing and patient denies urinary sx. Did receive ceftriaxone x 1 in ED  - urine culture pending  - hold  off on further abx for UTI at this time  # Mild troponin elevation without chest pain. Sinus tachycardia with normal ST baseline on EKG. Likely mild demand in setting of deconditioning and GI infection  - repeat troponin ordered  - order complete TTE  - would hold off ischemic evaluation at this time (patient is not good candidate for invasive procedures given prior thrombocytopenia, cirrhosis)  # Alcoholic cirrhosis with ascites. No concern for SBP at this time. Abdominal exam benign  - CIWA protocol  - folate and thiamine ordered  - holding spironolactone given hypotension  - no need for lactulose at this time given frequent stooling  # Bipolar disorder  - resume risperdol  - resume topoiramate  - resume fluoxetine  - did not resume other prior home meds including rizatriptan, vyvanse, modafinil which have not been refilled per pharmacy since March  # Chronic back pain   - resumed gabapentin 300mg  BID   # Hx of COPD - resp status on admission is stable and unremarkable CXR  - resume home inhaler  - prn duonebs   DVT Prophylaxis: lovenox  Code Status:  Full Code Family Communication: none  Disposition Plan: observation overnight and possible admission pending c diff workup  Extended Emergency Contact Information Primary Emergency Contact: Peri Maris [place] Home Phone: [phone] Mobile Phone: [phone] Relation: Friend Secondary Emergency Contact: Vicente Masson [place] Mobile Phone: [phone] Relation: Daughter Mother: [place] Address: [address]          [place], [place] [address] [place] Home Phone: [phone] Mobile Phone: [phone]  Time spent: >  55 minutes  Ike Bene, MD [facility] Pager [phone]  If 7PM-7AM, please contact night-coverage [email or web address] Password [email or web address] 08/21/2017, 10:00 PM

## 2017-08-21 NOTE — ED Notes (Signed)
Called bed placement to change level of care

## 2017-08-21 NOTE — ED Notes (Signed)
Date and time results received: 08/21/17 8:29 PM (use smartphrase ".now" to insert current time)  Test: TROP I Critical Value: 0.05  Name of Provider Notified: Loren Racer, MD  Orders Received? Or Actions Taken?:

## 2017-08-21 NOTE — ED Provider Notes (Signed)
[facility] EMERGENCY DEPARTMENT Provider Note   CSN: [id number] Arrival date & time: 08/21/17  1656     History   Chief Complaint Chief Complaint  Patient presents with  . Back Pain  . Chest Pain  . Diarrhea    HPI Anne Black is a 57 y.o. female.  HPI With past history of bipolar disorder, cirrhosis, COPD, multiple sclerosis and alcohol abuse presents with recent falls for the last few days.  She complains of low back pain which is worsened over the last week.  Does not radiate to her extremities.  She denies any known head injury.  No new focal weakness or numbness.  She does complain of some chest tightness which is episodic.  It is improved at this time.  Mild shortness of breath.  No productive cough.  No fever or chills. Past Medical History:  Diagnosis Date  . ADHD (attention deficit hyperactivity disorder)   . Alcoholism (HCC)   . Anxiety   . Arthritis   . Ascites   . Bipolar disorder (HCC)   . Cirrhosis (HCC)   . COPD (chronic obstructive pulmonary disease) (HCC)   . Hypokalemia   . Migraine   . Multiple sclerosis (HCC)   . Narcolepsy   . Osteoporosis     Patient Active Problem List   Diagnosis Date Noted  . Weakness 08/21/2017  . COPD exacerbation (HCC) 06/12/2017  . Chest pain with moderate risk for cardiac etiology 05/30/2017  . Abnormal LFTs 05/30/2017  . Tachycardia 05/30/2017  . COPD (chronic obstructive pulmonary disease) (HCC) 04/19/2017  . Right rib fracture 04/15/2017  . Abdominal wall cellulitis 12/30/2016  . S/P exploratory laparotomy 12/30/2016  . Distal radius fracture, left 09/09/2016  . Thrombocytopenia (HCC) 09/09/2016  . Chest x-ray abnormality   . Umbilical hernia without obstruction and without gangrene 05/12/2016  . Itching 02/24/2016  . Ventral hernia without obstruction or gangrene 02/24/2016  . Palliative care encounter   . Ascites   . Tachypnea   . Atrial flutter with rapid ventricular response (HCC)     . Hypomagnesemia   . Hypoxia   . Dehydration 01/22/2016  . Low back ache 12/30/2015  . Non-intractable vomiting with nausea   . Alcohol abuse with alcohol-induced mood disorder (HCC) 12/15/2015  . Acute alcoholism (HCC)   . Abdominal distension 11/04/2015  . Alcohol abuse   . Dyspnea   . Acute respiratory failure (HCC) 10/15/2015  . Ascites due to alcoholic cirrhosis (HCC) 10/15/2015  . Osteoporosis   . Bipolar I disorder (HCC)   . Multiple sclerosis (HCC)   . Alcoholic cirrhosis of liver with ascites (HCC) 08/22/2015  . Bipolar disorder, current episode mixed, moderate (HCC) 08/01/2015  . Alcohol use disorder, severe, dependence (HCC) 07/31/2015  . Macrocytic anemia- due to alcohol abuse with normal B12 & folate levels 05/31/2015  . Severe protein-calorie malnutrition (HCC) 05/31/2015  . Hypokalemia 05/26/2015  . Encephalopathy, hepatic (HCC) 05/26/2015  . Alcohol withdrawal (HCC) 05/18/2015  . Abdominal pain 05/18/2015  . Anxiety 05/18/2015  . UTI (lower urinary tract infection) 05/18/2015  . Stimulant abuse (HCC) 12/27/2014  . Nicotine abuse 12/27/2014  . Hyperprolactinemia (HCC) 11/15/2014  . Dyslipidemia   . Tobacco abuse 01/23/2014  . Noncompliance with therapeutic plan 04/04/2013  . Hereditary and idiopathic peripheral neuropathy 05/01/2012  . GERD (gastroesophageal reflux disease) 05/01/2012  . Osteoarthrosis, unspecified whether generalized or localized, involving lower leg 05/01/2012  . Hyponatremia 07/01/2011    Past Surgical History:  Procedure  Laterality Date  . ABDOMINAL WALL DEFECT REPAIR N/A 12/30/2016   Procedure: EXPLORATORY LAPAROTOMY WITH REPAIR ABDOMINAL WALL VENTRAL HERNIA;  Surgeon: [name], MD;  Location: [facility] OR;  Service: General;  Laterality: N/A;  . APPLICATION OF WOUND VAC N/A 12/30/2016   Procedure: APPLICATION OF WOUND VAC;  Surgeon: [name], MD;  Location: [facility] OR;  Service: General;  Laterality: N/A;  . CESAREAN SECTION   [phone]  . FRACTURE SURGERY    . HERNIA REPAIR    . MYRINGOTOMY WITH TUBE PLACEMENT Bilateral   . ORIF WRIST FRACTURE Left 09/09/2016   Procedure: OPEN REDUCTION INTERNAL FIXATION (ORIF) LEFT WRIST FRACTURE, LEFT CARPAL TUNNEL RELEASE;  Surgeon: Dominica Severin, MD;  Location: [facility] ORS;  Service: Orthopedics;  Laterality: Left;  . TONSILLECTOMY       OB History   None      Home Medications    Prior to Admission medications   Medication Sig Start Date End Date Taking? Authorizing Provider  albuterol (PROVENTIL HFA;VENTOLIN HFA) 108 (90 Base) MCG/ACT inhaler Inhale 2 puffs into the lungs every 6 (six) hours as needed for wheezing or shortness of breath.   Yes [provider]  amoxicillin-clavulanate (AUGMENTIN) 500-125 MG tablet Take 1 tablet by mouth 2 (two) times daily.   Yes [provider]  fluticasone (FLONASE) 50 MCG/ACT nasal spray Place 1 spray into both nostrils as needed for allergies or rhinitis.   Yes [provider]  Fluticasone-Salmeterol (ADVAIR) 100-50 MCG/DOSE AEPB Inhale 1-2 puffs 2 (two) times daily into the lungs.  11/05/14  Yes [provider]  gabapentin (NEURONTIN) 100 MG capsule Take 300-400 mg by mouth 2 (two) times daily.  06/02/17  Yes [provider]  ibuprofen (ADVIL,MOTRIN) 800 MG tablet Take 800 mg by mouth every 8 (eight) hours as needed (pain).  08/20/17  Yes [provider]  pantoprazole (PROTONIX) 40 MG tablet Take 1 tablet (40 mg total) by mouth daily. 12/30/15  Yes Haney, Alyssa A, MD  bismuth subsalicylate (PEPTO-BISMOL) 262 MG chewable tablet Chew 2 tablets (524 mg total) by mouth as needed for diarrhea or loose stools. 07/08/17   Molpus, John, MD  FLUoxetine (PROZAC) 20 MG capsule Take 20 mg by mouth daily.  03/30/16   [provider]  furosemide (LASIX) 40 MG tablet Take 1 tablet (40 mg total) by mouth daily. Patient not taking: Reported on 08/21/2017 06/17/16   Midge Minium, MD  hydrocortisone  cream 1 % Apply topically 3 (three) times daily. 04/20/17   Marguerita Merles Latif, DO  hydrOXYzine (VISTARIL) 25 MG capsule Take 25 mg by mouth 3 (three) times daily as needed for anxiety or itching.  05/08/17   [provider]  lisdexamfetamine (VYVANSE) 70 MG capsule Take 1 capsule (70 mg total) by mouth daily. 05/06/17   Gwyneth Sprout, MD  Menthol-Methyl Salicylate (MUSCLE RUB) 10-15 % CREA Apply 1 application topically as needed for muscle pain. 04/20/17   Sheikh, Omair Latif, DO  modafinil (PROVIGIL) 200 MG tablet Take 200 mg by mouth 2 (two) times daily.  08/04/16   [provider]  nicotine (NICODERM CQ - DOSED IN MG/24 HOURS) 21 mg/24hr patch Place 1 patch (21 mg total) onto the skin daily. Patient not taking: Reported on 08/21/2017 04/21/17   Marguerita Merles Latif, DO  potassium chloride SA (K-DUR,KLOR-CON) 20 MEQ tablet Take 20 mEq by mouth as needed (low potassium).     [provider]  risperiDONE (RISPERDAL) 0.5 MG tablet Take 1 tablet (0.5 mg  total) by mouth 3 (three) times daily as needed (agitation). Patient taking differently: Take 0.5 mg by mouth 2 (two) times daily.  02/06/16   Renne Musca, MD  rizatriptan (MAXALT) 5 MG tablet TK 1 T PO QD 06/14/17   [provider]  spironolactone (ALDACTONE) 100 MG tablet Take 1 tablet (100 mg total) by mouth daily. Patient not taking: Reported on 08/20/2017 11/23/16   Derwood Kaplan, MD  topiramate (TOPAMAX) 100 MG tablet Take 1 tablet (100 mg total) by mouth daily. Patient taking differently: Take 100 mg by mouth every evening.  01/07/16   [name], MD    Family History Family History  Problem Relation Age of Onset  . Arrhythmia Mother   . Heart disease Father   . Hypertension Father     Social History Social History   Tobacco Use  . Smoking status: Current Every Day Smoker    Packs/day: 1.00    Years: 35.00    Pack years: 35.00    Types: Cigarettes  . Smokeless tobacco: Never Used    Substance Use Topics  . Alcohol use: Yes    Alcohol/week: 16.8 oz    Types: 28 Glasses of wine per week    Comment: A bottle of wine or more per day.  . Drug use: No     Allergies   Patient has no known allergies.   Review of Systems Review of Systems  Constitutional: Positive for fatigue. Negative for chills and fever.  HENT: Negative for sore throat and trouble swallowing.   Eyes: Negative for visual disturbance.  Respiratory: Positive for shortness of breath. Negative for cough.   Cardiovascular: Positive for chest pain. Negative for palpitations and leg swelling.  Gastrointestinal: Negative for abdominal pain, constipation, diarrhea, nausea and vomiting.  Genitourinary: Negative for dysuria, flank pain, frequency and hematuria.  Musculoskeletal: Positive for back pain and gait problem. Negative for neck pain and neck stiffness.  Skin: Negative for rash and wound.  Neurological: Negative for dizziness, weakness, light-headedness, numbness and headaches.  All other systems reviewed and are negative.    Physical Exam Updated Vital Signs BP (!) 103/50   Pulse 99   Resp (!) 21   Ht 5\' 2"  (1.575 m)   Wt 77.1 kg (170 lb)   SpO2 100%   BMI 31.09 kg/m   Physical Exam  Constitutional: She is oriented to person, place, and time. She appears well-developed and well-nourished. No distress.  HENT:  Head: Normocephalic and atraumatic.  Mouth/Throat: Oropharynx is clear and moist. No oropharyngeal exudate.  Cranial nerves II through XII grossly intact.  Eyes: Pupils are equal, round, and reactive to light. EOM are normal.  Neck: Normal range of motion. Neck supple.  No posterior midline cervical tenderness to palpation.  Cardiovascular: Normal rate and regular rhythm. Exam reveals no gallop and no friction rub.  No murmur heard. Pulmonary/Chest: Effort normal and breath sounds normal. No respiratory distress. She has no wheezes. She has no rales.  Mild diminished breath  sounds in bilateral bases.  Abdominal: Soft. Bowel sounds are normal. She exhibits no distension and no mass. There is no tenderness. There is no rebound and no guarding. No hernia.  Musculoskeletal: Normal range of motion. She exhibits no edema or tenderness.  No midline thoracic tenderness to palpation.  Patient has some inferior lumbar tenderness but this is very mild.  There is no step-offs or deformity.  Lower extremities without asymmetry, tenderness or swelling.  Distal pulses are 2+.  No CVA tenderness bilaterally.  Neurological: She is alert and oriented to person, place, and time.  Patient is alert and oriented x3 with clear, goal oriented speech. Patient has 5/5 motor in all extremities. Sensation is intact to light touch. Bilateral finger-to-nose is normal with no signs of dysmetria.  Skin: Skin is warm and dry. Capillary refill takes less than 2 seconds. No rash noted. She is not diaphoretic. No erythema.  Psychiatric: She has a normal mood and affect. Her behavior is normal.  Nursing note and vitals reviewed.    ED Treatments / Results  Labs (all labs ordered are listed, but only abnormal results are displayed) Labs Reviewed  CBC WITH DIFFERENTIAL/PLATELET - Abnormal; Notable for the following components:      Result Value   WBC 20.1 (*)    Hemoglobin 11.7 (*)    MCHC 29.5 (*)    RDW 16.8 (*)    Neutro Abs 18.0 (*)    Abs Immature Granulocytes 0.2 (*)    All other components within normal limits  COMPREHENSIVE METABOLIC PANEL - Abnormal; Notable for the following components:   Potassium 2.9 (*)    BUN <5 (*)    Creatinine, Ser 0.37 (*)    Calcium 7.7 (*)    Total Protein 5.3 (*)    Albumin 2.9 (*)    All other components within normal limits  TROPONIN I - Abnormal; Notable for the following components:   Troponin I 0.05 (*)    All other components within normal limits  URINALYSIS, ROUTINE W REFLEX MICROSCOPIC - Abnormal; Notable for the following components:    Leukocytes, UA SMALL (*)    Bacteria, UA RARE (*)    All other components within normal limits  URINE CULTURE  C DIFFICILE QUICK SCREEN W PCR REFLEX  CBC  CREATININE, SERUM  TROPONIN I  TROPONIN I  COMPREHENSIVE METABOLIC PANEL  CBC WITH DIFFERENTIAL/PLATELET    EKG EKG Interpretation  Date/Time:  Sunday August 21 2017 17:05:37 EDT Ventricular Rate:  99 PR Interval:    QRS Duration: 93 QT Interval:  351 QTC Calculation: 451 R Axis:   82 Text Interpretation:  Sinus rhythm Baseline wander in lead(s) V1 V2 Confirmed by Loren Racer ([id number]) on 08/21/2017 5:58:32 PM   Radiology Dg Chest 2 View  Result Date: 08/21/2017 CLINICAL DATA:  from home with complaints of back pain, recent falls, nausea, HA, chest tightness. Pt was discharged Friday after spending 2 weeks in the hospital due to COPD EXAM: CHEST - 2 VIEW COMPARISON:  06/12/2017 FINDINGS: Cardiac silhouette is normal in size. No mediastinal or hilar masses. No evidence of adenopathy. Clear lungs. No pleural effusion or pneumothorax. Skeletal structures are intact. IMPRESSION: No active cardiopulmonary disease. Electronically Signed   By: Amie Portland M.D.   On: 08/21/2017 19:03   Dg Lumbar Spine Complete  Result Date: 08/21/2017 CLINICAL DATA:  from home with complaints of back pain, recent falls, nausea, HA, chest tightness. Pt was discharged Friday after spending 2 weeks in the hospital due to COPD EXAM: LUMBAR SPINE - COMPLETE 4+ VIEW COMPARISON:  CT, 06/12/2017 FINDINGS: No fracture. No spondylolisthesis. Mild loss of disc height at L4-L5. Minor endplate osteophytes from L2 through L5. Mild facet degenerative change in the lower lumbar spine. Scattered aortic atherosclerotic calcifications. Soft tissues are otherwise unremarkable. IMPRESSION: 1. No fracture or acute finding.  Mild degenerative changes. Electronically Signed   By: Amie Portland M.D.   On: 08/21/2017 19:05    Procedures Procedures (including  critical care  time)  Medications Ordered in ED Medications  potassium chloride 10 mEq in 100 mL IVPB (10 mEq Intravenous New Bag/Given 08/21/17 2059)  cefTRIAXone (ROCEPHIN) 1 g in sodium chloride 0.9 % 100 mL IVPB (has no administration in time range)  potassium chloride 10 mEq in 100 mL IVPB (has no administration in time range)  FLUoxetine (PROZAC) capsule 20 mg (has no administration in time range)  mometasone-formoterol (DULERA) 100-5 MCG/ACT inhaler 2 puff (has no administration in time range)  gabapentin (NEURONTIN) capsule 300 mg (has no administration in time range)  risperiDONE (RISPERDAL) tablet 0.5 mg (has no administration in time range)  topiramate (TOPAMAX) tablet 100 mg (has no administration in time range)  LORazepam (ATIVAN) tablet 1 mg (has no administration in time range)    Or  LORazepam (ATIVAN) injection 1 mg (has no administration in time range)  thiamine (VITAMIN B-1) tablet 100 mg (has no administration in time range)    Or  thiamine (B-1) injection 100 mg (has no administration in time range)  folic acid (FOLVITE) tablet 1 mg (has no administration in time range)  multivitamin with minerals tablet 1 tablet (has no administration in time range)  enoxaparin (LOVENOX) injection 40 mg (has no administration in time range)  potassium citrate (UROCIT-K) SR tablet 40 mEq (has no administration in time range)  acetaminophen (TYLENOL) tablet 1,000 mg (1,000 mg Oral Given 08/21/17 1933)  sodium chloride 0.9 % bolus 500 mL (500 mLs Intravenous New Bag/Given 08/21/17 2058)     Initial Impression / Assessment and Plan / ED Course  I have reviewed the triage vital signs and the nursing notes.  Pertinent labs & imaging results that were available during my care of the patient were reviewed by me and considered in my medical decision making (see chart for details).     Patient has very mild elevation in troponin.  No definite ischemic findings on EKG.  She also is hypokalemic and has  evidence of UTI.  Started on potassium replacement and urine sent for culture.  We will also start antibiotics.  Discussed with hospitalist who will bring patient in the emergency department to cycle troponin and replace potassium.   Final Clinical Impressions(s) / ED Diagnoses   Final diagnoses:  Hypokalemia  Acute lower UTI  Elevated troponin    ED Discharge Orders    None       Loren Racer, MD 08/21/17 2217

## 2017-08-21 NOTE — ED Triage Notes (Signed)
Pt arrives via EMS from home with complaints of back pain, recent falls, nausea, HA, chest tightness. Pt was discharged Friday after spending 2 weeks in the hospital due to COPD.

## 2017-08-22 ENCOUNTER — Other Ambulatory Visit: Payer: Self-pay

## 2017-08-22 ENCOUNTER — Inpatient Hospital Stay (HOSPITAL_COMMUNITY): Payer: No Typology Code available for payment source

## 2017-08-22 ENCOUNTER — Encounter (HOSPITAL_COMMUNITY): Payer: Self-pay | Admitting: *Deleted

## 2017-08-22 DIAGNOSIS — R531 Weakness: Secondary | ICD-10-CM

## 2017-08-22 DIAGNOSIS — R748 Abnormal levels of other serum enzymes: Secondary | ICD-10-CM

## 2017-08-22 DIAGNOSIS — I5030 Unspecified diastolic (congestive) heart failure: Secondary | ICD-10-CM

## 2017-08-22 DIAGNOSIS — E876 Hypokalemia: Secondary | ICD-10-CM

## 2017-08-22 DIAGNOSIS — N390 Urinary tract infection, site not specified: Secondary | ICD-10-CM

## 2017-08-22 LAB — COMPREHENSIVE METABOLIC PANEL
ALBUMIN: 2.7 g/dL — AB (ref 3.5–5.0)
ALT: 22 U/L (ref 0–44)
AST: 17 U/L (ref 15–41)
Alkaline Phosphatase: 93 U/L (ref 38–126)
Anion gap: 9 (ref 5–15)
BUN: 5 mg/dL — ABNORMAL LOW (ref 6–20)
CALCIUM: 8.6 mg/dL — AB (ref 8.9–10.3)
CO2: 26 mmol/L (ref 22–32)
CREATININE: 0.42 mg/dL — AB (ref 0.44–1.00)
Chloride: 104 mmol/L (ref 98–111)
GFR calc Af Amer: >60 mL/min (ref >60)
GFR calc non Af Amer: >60 mL/min (ref >60)
GLUCOSE: 93 mg/dL (ref 70–99)
Potassium: 3.5 mmol/L (ref 3.5–5.1)
SODIUM: 139 mmol/L (ref 135–145)
Total Bilirubin: 1.1 mg/dL (ref 0.3–1.2)
Total Protein: 5 g/dL — ABNORMAL LOW (ref 6.5–8.1)

## 2017-08-22 LAB — CBC WITH DIFFERENTIAL/PLATELET
Abs Immature Granulocytes: 0.2 10*3/uL — ABNORMAL HIGH (ref 0.0–0.1)
BASOS PCT: 0 %
Basophils Absolute: 0.1 10*3/uL (ref 0.0–0.1)
EOS ABS: 0 10*3/uL (ref 0.0–0.7)
EOS PCT: 0 %
HCT: 37.3 % (ref 36.0–46.0)
Hemoglobin: 11.2 g/dL — ABNORMAL LOW (ref 12.0–15.0)
Immature Granulocytes: 1 %
Lymphocytes Relative: 8 %
Lymphs Abs: 1.1 10*3/uL (ref 0.7–4.0)
MCH: 27.5 pg (ref 26.0–34.0)
MCHC: 30 g/dL (ref 30.0–36.0)
MCV: 91.4 fL (ref 78.0–100.0)
MONO ABS: 0.8 10*3/uL (ref 0.1–1.0)
Monocytes Relative: 5 %
Neutro Abs: 12.2 10*3/uL — ABNORMAL HIGH (ref 1.7–7.7)
Neutrophils Relative %: 86 %
PLATELETS: 162 10*3/uL (ref 150–400)
RBC: 4.08 MIL/uL (ref 3.87–5.11)
RDW: 16.5 % — AB (ref 11.5–15.5)
WBC: 14.3 10*3/uL — ABNORMAL HIGH (ref 4.0–10.5)

## 2017-08-22 LAB — GLUCOSE, CAPILLARY
GLUCOSE-CAPILLARY: 104 mg/dL — AB (ref 70–99)
Glucose-Capillary: 93 mg/dL (ref 70–99)
Glucose-Capillary: 94 mg/dL (ref 70–99)

## 2017-08-22 LAB — ECHOCARDIOGRAM COMPLETE
HEIGHTINCHES: 62 in
WEIGHTICAEL: 2619.2 [oz_av]

## 2017-08-22 LAB — TROPONIN I: Troponin I: <0.03 ng/mL (ref <0.03)

## 2017-08-22 MED ORDER — SUMATRIPTAN SUCCINATE 25 MG PO TABS
25.0000 mg | ORAL_TABLET | ORAL | Status: DC | PRN
  Administered 2017-08-22 – 2017-08-23 (×2): 25 mg via ORAL
  Filled 2017-08-22 (×3): qty 1

## 2017-08-22 MED ORDER — TRAMADOL HCL 50 MG PO TABS
25.0000 mg | ORAL_TABLET | Freq: Two times a day (BID) | ORAL | Status: DC | PRN
  Administered 2017-08-22: 25 mg via ORAL
  Filled 2017-08-22 (×2): qty 1

## 2017-08-22 MED ORDER — NICOTINE 21 MG/24HR TD PT24
21.0000 mg | MEDICATED_PATCH | Freq: Every day | TRANSDERMAL | Status: DC
  Administered 2017-08-22 – 2017-08-23 (×2): 21 mg via TRANSDERMAL
  Filled 2017-08-22 (×2): qty 1

## 2017-08-22 NOTE — Progress Notes (Signed)
  Echocardiogram 2D Echocardiogram has been performed.  Anne Black F 08/22/2017, 9:52 AM

## 2017-08-22 NOTE — Progress Notes (Signed)
Noted consult for [facility]; awaiting for Physical Therapy eval for disposition needs; CM will continue to follow for progression of care; [facility] [phone]

## 2017-08-22 NOTE — Progress Notes (Signed)
MEDICATION RELATED NOTE    Pharmacy Re: Home Meds  Assessment: Pharmacy has attempted to complete medication history for this patient.  She is not sure of what she takes nor how often.  We received a list of medications from [facility] and I have entered the medications from this list.  Some of these she states she is no longer taking - but she's not exactly sure so I have included them all.  Plan:  Please review her list of medications and resume those you feel are appropriate for her health now and at discharge.  Nadara Mustard, PharmD., MS Clinical Pharmacist Pager:  [phone] Thank you for allowing pharmacy to be part of this patients care team. 08/22/2017,10:39 AM

## 2017-08-22 NOTE — Progress Notes (Signed)
   08/22/17 1100  Clinical Encounter Type  Visited With Patient  Visit Type Initial  Referral From Nurse  Spiritual Encounters  Spiritual Needs Brochure  Stress Factors  Patient Stress Factors Exhausted    Pt had a very bad headache when I visited this morning. Pt requested for the POA to be left for her to go through when she felt better and would call the spiritual care through her nurse. Chaplain provided POA papers and compassionate presence.  Anne Black a Water quality scientist, [employer]

## 2017-08-22 NOTE — Progress Notes (Signed)
PT Cancellation Note  Patient Details Name: Anne Black MRN: [id number] DOB: [birth year]   Cancelled Treatment:    Reason Eval/Treat Not Completed: Patient declined, no reason specified(pt supine in bed and refuses to attempt any mobility or answer any questions. She states "I'm too tired" "I'm not getting into it now")   [name] B [name] 08/22/2017, 11:24 AM Delaney Meigs, PT [phone]

## 2017-08-22 NOTE — Progress Notes (Signed)
OT Cancellation Note  Patient Details Name: [name] MRN: [id number] DOB: 11/26/1960   Cancelled Treatment:    Reason Eval/Treat Not Completed: Patient declined, no reason specified;Pain limiting ability to participate. Pt reporting headache. Declining to work with therapy at this time despite encouragement. Will follow up for OT eval as time allows.  Jeneen Rinks A. Brett Albino, M.S., OTR/L Acute Rehab Department: [phone]  08/22/2017, 4:21 PM

## 2017-08-22 NOTE — Consult Note (Signed)
WOC Nurse wound consult note Reason for Consult:Chronic wound on abdomen from previous hernia repair.  S/P NPWT and a variety of topical therapies. Well known to our department. Wound type:Chronic, nonhealing surgical wound Pressure Injury POA: N/A Measurement: 7cm x 2.4cm x 0.1cm Wound ZOX:WRUE, wet with thin layer of mucus covering wound that is easily removed with saline dampened gauze. Drainage (amount, consistency, odor) small to moderate amount of light yellow to clear serous exudate Periwound:intact with mild maceration.  Wound dressing is in need of changing at this time. Dressing procedure/placement/frequency: I will implement a daily dressing using silver hydrofiber (Aquacel Ag+) to the midline wound to correct bioburden and manage excess exudate. The silicone foam dressing is not adequately managing moisture and is contributing to the maceration, so I will change to gauze and paper tape with dressing changes. WOC nursing team will not follow, but will remain available to this patient, the nursing and medical teams.  Please re-consult if needed. Thanks, Ladona Mow, MSN, RN, GNP, [place]  Pager# [phone]

## 2017-08-22 NOTE — Progress Notes (Signed)
Attending MD note  Patient was seen, examined,treatment plan was discussed with the PA-S.  I have personally reviewed the clinical findings, lab, imaging studies and management of this patient in detail. I agree with the documentation, as recorded by the PA-S  Patient is a 57 year old female with history of alcohol induced liver cirrhosis, COPD, bipolar disorder, coronary artery disease, tobacco abuse, who presents to the hospital with weakness and profuse watery diarrhea over the last several days.  She was recently hospitalized in [place] with alcohol withdrawal and COPD exacerbation, discharge summary reviewed and it appears that she was recommended to go to SNF however she refused and chose to go home instead.  She tells me that upon arriving at home she continued to consume alcohol and last drink was the day prior to this hospital stay.  BP (!) 110/56 (BP Location: Left Arm)   Pulse 97   Temp 98.4 F (36.9 C) (Oral)   Resp 18   Ht 5\' 2"  (1.575 m)   Wt 74.3 kg (163 lb 11.2 oz)   SpO2 96%   BMI 29.94 kg/m  On Exam: Gen. exam: Awake, alert, not in any distress Chest: Good air entry bilaterally, no rhonchi or rales CVS: S1-S2 regular, no murmurs Abdomen: Soft, nontender and nondistended Neurology: Non-focal Skin: No rash or lesions  Plan  Generalized weakness -Multifactorial due to recent prolonged hospital stay, COPD exacerbation as well as diarrhea -Obtain physical therapy evaluation, patient did express interest in going to SNF  Alcoholic cirrhosis/alcohol dependence -Has a history of ascites but currently appears somewhat compensated, bilirubin is normal, LFTs are normal, she has normal creatinine -We will need to quit alcohol completely  Diarrhea with leukocytosis -On admission White count 20 K, improved to 14 K this morning.  Diarrhea seems to have resolved.  May have been simply Augmentin induced.  If she has no further diarrhea this is unlikely to have been C.  Difficile  Hypokalemia -Due to diarrhea, repleted  Troponin elevation -Initial troponin 0 0.05, repeat were undetectable x2, likely demand.  No further testing necessary.  2D echo unremarkable.  Discontinue telemetry  COPD -No wheezing, this is stable, continue home medications  Rest as below  Costin M. Elvera Lennox, MD [facility] [phone]  If 7PM-7AM, please contact night-coverage [email or web address] Password [email or web address]    PROGRESS NOTE    Anne Black  ELM:[id number] DOB: Feb 21, 1960 DOA: 08/21/2017 PCP: Doristine Bosworth, MD  Outpatient Specialists:    Brief Narrative:  57 yo female with a PMH significant for alcohol abuse, liver cirrhosis with ascites, COPD, presents to [facility] with complaining of 1x wk weakness with loose watery stools. Was unable to perform ADLs at home. Reports multiple incidents where she would slide out of her chair onto the floor. Patient recently hospitalized for appr. 14 days for COPD exacerbations in [place] but she is not sure where exactly. She has been taking Augmentin since her last hospitalization. Has a history of noncompliance with therapeutic plans.  In the ED she was found to have significant leukocytosis with white count of 20 and given diarrhea there was high suspicion for C. difficile and she was admitted to the hospital.  Assessment & Plan:   Active Problems:   Weakness   Generalized weakness -Probably multifactorial related to prolonged hospital stay as well as diarrhea, physical therapy evaluation pending -XR of lumbar spine showed no fracture or acute finding. Mild degenerative changes.  Leukocytosis -diarrhea resolved. Low suspicion for c diff.  -repeat CBC showed persistent  elevated WBC at 14.3 (yesterday 20.4) -UA showed small levels of leukocytes and rare bacteria. Urine culture pending -patient negative for urinary symptoms at this time  Hypokalemia -potassium WNL at 3.5 -d/c potassium supplementation  Alcohol  Abuse -patient's last drink was yesterday -will monitor patient overnight for signs of alcohol withdrawals -continue thiamine 100 mg daily -continue ativan 1 mg Q6H -continue folic acid 1 mg daily  Elevated Troponin -patient complained of onset of chest pain while in the ED -troponin levels negative -EKG NSR with baseline wanders in V1 & V2 -Echo showed EF 55-60% with normal LV systolic function; mild diastolic dysfunction; trace MR and TR  COPD -CXR showed no active cardiopulmonary disease and she is not wheezing -continue duoneb Q6H prn -continue Dulera 2 puffs BID   DVT prophylaxis: Lovenox  Code Status: full  Family Communication: no family at bedside during interview  Disposition Plan: patient will stay in-hospital tonight due to concern for alcohol withdrawals. Last drink was yesterday.  Consultants:   None   Procedures:  None   Antimicrobials:   Rocephin, one dose.   Subjective: Patient was sitting upright, eating, alert and oriented in no acute distress. Reports severe nausea yesterday with loose stools but no vomiting. Symptoms have since resolved. Tolerating PO drugs and eating regular diet with no issues. Endorses the desire to go to SNF following discharge in order to work on her sobriety.   Objective: Vitals:   08/22/17 0036 08/22/17 0041 08/22/17 0449 08/22/17 0841  BP:  (!) 108/57 110/70 (!) 110/56  Pulse:  88 94 97  Resp:  19 16 18   Temp:  98.4 F (36.9 C) 98.3 F (36.8 C) 98.4 F (36.9 C)  TempSrc:  Oral Oral Oral  SpO2:  92% 93% 96%  Weight: 74.3 kg (163 lb 11.2 oz)     Height:        Intake/Output Summary (Last 24 hours) at 08/22/2017 1249 Last data filed at 08/22/2017 0833 Gross per 24 hour  Intake 1455.38 ml  Output 700 ml  Net 755.38 ml   Filed Weights   08/21/17 1702 08/22/17 0036  Weight: 77.1 kg (170 lb) 74.3 kg (163 lb 11.2 oz)    Examination:  General exam: Appears calm and comfortable  Respiratory system: Clear to  auscultation. Respiratory effort normal. Diffusely diminished breath sounds. Cardiovascular system: S1 & S2 heard, RRR. No JVD, murmurs, rubs, gallops or clicks. No pedal edema. Gastrointestinal system: Mild distension, soft and diffusely tender. No organomegaly or masses felt. Normal bowel sounds heard. Central nervous system: Alert and oriented. No focal neurological deficits. Extremities: Symmetric 5 x 5 power. Skin: No rashes, lesions or ulcers Psychiatry: Judgement and insight appear normal. Mood & affect appropriate.   Data Reviewed: I have personally reviewed following labs and imaging studies  CBC: Recent Labs  Lab 08/21/17 1850 08/21/17 2154 08/22/17 0420  WBC 20.1* 20.4* 14.3*  NEUTROABS 18.0*  --  12.2*  HGB 11.7* 11.1* 11.2*  HCT 39.6 37.0 37.3  MCV 92.1 91.8 91.4  PLT 170 166 162   Basic Metabolic Panel: Recent Labs  Lab 08/21/17 1850 08/21/17 2154 08/22/17 0420  NA 137  --  139  K 2.9*  --  3.5  CL 103  --  104  CO2 24  --  26  GLUCOSE 82  --  93  BUN <5*  --  5*  CREATININE 0.37* 0.49 0.42*  CALCIUM 7.7*  --  8.6*   GFR: Estimated Creatinine Clearance: 73.2 mL/min (  A) (by C-G formula based on SCr of 0.42 mg/dL (L)). Liver Function Tests: Recent Labs  Lab 08/21/17 1850 08/22/17 0420  AST 21 17  ALT 25 22  ALKPHOS 102 93  BILITOT 1.2 1.1  PROT 5.3* 5.0*  ALBUMIN 2.9* 2.7*   No results for input(s): LIPASE, AMYLASE in the last 168 hours. No results for input(s): AMMONIA in the last 168 hours. Coagulation Profile: No results for input(s): INR, PROTIME in the last 168 hours. Cardiac Enzymes: Recent Labs  Lab 08/21/17 1850 08/21/17 2154 08/22/17 0420  TROPONINI 0.05* <0.03 <0.03   BNP (last 3 results) No results for input(s): PROBNP in the last 8760 hours. HbA1C: No results for input(s): HGBA1C in the last 72 hours. CBG: Recent Labs  Lab 08/22/17 0801 08/22/17 1107  GLUCAP 93 94   Lipid Profile: No results for input(s): CHOL, HDL,  LDLCALC, TRIG, CHOLHDL, LDLDIRECT in the last 72 hours. Thyroid Function Tests: No results for input(s): TSH, T4TOTAL, FREET4, T3FREE, THYROIDAB in the last 72 hours. Anemia Panel: No results for input(s): VITAMINB12, FOLATE, FERRITIN, TIBC, IRON, RETICCTPCT in the last 72 hours. Urine analysis:    Component Value Date/Time   COLORURINE YELLOW 08/21/2017 2031   APPEARANCEUR CLEAR 08/21/2017 2031   LABSPEC 1.010 08/21/2017 2031   PHURINE 6.0 08/21/2017 2031   GLUCOSEU NEGATIVE 08/21/2017 2031   HGBUR NEGATIVE 08/21/2017 2031   BILIRUBINUR NEGATIVE 08/21/2017 2031   BILIRUBINUR moderate (A) 06/19/2015 1906   KETONESUR NEGATIVE 08/21/2017 2031   PROTEINUR NEGATIVE 08/21/2017 2031   UROBILINOGEN >=8.0 06/19/2015 1906   UROBILINOGEN 0.2 09/14/2011 2141   NITRITE NEGATIVE 08/21/2017 2031   LEUKOCYTESUR SMALL (A) 08/21/2017 2031   Sepsis Labs: @LABRCNTIP (procalcitonin:4,lacticidven:4)  )No results found for this or any previous visit (from the past 240 hour(s)).       Radiology Studies: Dg Chest 2 View  Result Date: 08/21/2017 CLINICAL DATA:  from home with complaints of back pain, recent falls, nausea, HA, chest tightness. Pt was discharged Friday after spending 2 weeks in the hospital due to COPD EXAM: CHEST - 2 VIEW COMPARISON:  06/12/2017 FINDINGS: Cardiac silhouette is normal in size. No mediastinal or hilar masses. No evidence of adenopathy. Clear lungs. No pleural effusion or pneumothorax. Skeletal structures are intact. IMPRESSION: No active cardiopulmonary disease. Electronically Signed   By: Amie Portland M.D.   On: 08/21/2017 19:03   Dg Lumbar Spine Complete  Result Date: 08/21/2017 CLINICAL DATA:  from home with complaints of back pain, recent falls, nausea, HA, chest tightness. Pt was discharged Friday after spending 2 weeks in the hospital due to COPD EXAM: LUMBAR SPINE - COMPLETE 4+ VIEW COMPARISON:  CT, 06/12/2017 FINDINGS: No fracture. No spondylolisthesis. Mild loss  of disc height at L4-L5. Minor endplate osteophytes from L2 through L5. Mild facet degenerative change in the lower lumbar spine. Scattered aortic atherosclerotic calcifications. Soft tissues are otherwise unremarkable. IMPRESSION: 1. No fracture or acute finding.  Mild degenerative changes. Electronically Signed   By: Amie Portland M.D.   On: 08/21/2017 19:05        Scheduled Meds: . enoxaparin (LOVENOX) injection  40 mg Subcutaneous Q24H  . FLUoxetine  20 mg Oral Daily  . folic acid  1 mg Oral Daily  . gabapentin  300 mg Oral BID  . mometasone-formoterol  2 puff Inhalation BID  . multivitamin with minerals  1 tablet Oral Daily  . potassium citrate  40 mEq Oral Once  . risperiDONE  0.5 mg Oral BID  .  thiamine  100 mg Oral Daily   Or  . thiamine  100 mg Intravenous Daily  . topiramate  100 mg Oral QPM   Continuous Infusions:   LOS: 1 day    Anne Hai, PA-S Pamella Pert MD [facility] Pager [phone]  If 7PM-7AM, please contact night-coverage [email or web address] Password [facility] 08/22/2017, 12:49 PM

## 2017-08-23 DIAGNOSIS — K7031 Alcoholic cirrhosis of liver with ascites: Secondary | ICD-10-CM

## 2017-08-23 LAB — COMPREHENSIVE METABOLIC PANEL
ALBUMIN: 2.9 g/dL — AB (ref 3.5–5.0)
ALT: 19 U/L (ref 0–44)
ANION GAP: 7 (ref 5–15)
AST: 17 U/L (ref 15–41)
Alkaline Phosphatase: 96 U/L (ref 38–126)
BILIRUBIN TOTAL: 1 mg/dL (ref 0.3–1.2)
BUN: <5 mg/dL — ABNORMAL LOW (ref 6–20)
CALCIUM: 8.7 mg/dL — AB (ref 8.9–10.3)
CO2: 28 mmol/L (ref 22–32)
Chloride: 102 mmol/L (ref 98–111)
Creatinine, Ser: 0.52 mg/dL (ref 0.44–1.00)
GFR calc non Af Amer: >60 mL/min (ref >60)
Glucose, Bld: 123 mg/dL — ABNORMAL HIGH (ref 70–99)
POTASSIUM: 3.4 mmol/L — AB (ref 3.5–5.1)
Sodium: 137 mmol/L (ref 135–145)
TOTAL PROTEIN: 5.3 g/dL — AB (ref 6.5–8.1)

## 2017-08-23 LAB — CBC WITH DIFFERENTIAL/PLATELET
Abs Immature Granulocytes: 0.2 10*3/uL — ABNORMAL HIGH (ref 0.0–0.1)
BASOS PCT: 1 %
Basophils Absolute: 0.1 10*3/uL (ref 0.0–0.1)
Eosinophils Absolute: 0 10*3/uL (ref 0.0–0.7)
Eosinophils Relative: 0 %
HCT: 39 % (ref 36.0–46.0)
HEMOGLOBIN: 11.6 g/dL — AB (ref 12.0–15.0)
IMMATURE GRANULOCYTES: 1 %
LYMPHS PCT: 7 %
Lymphs Abs: 0.9 10*3/uL (ref 0.7–4.0)
MCH: 27.2 pg (ref 26.0–34.0)
MCHC: 29.7 g/dL — AB (ref 30.0–36.0)
MCV: 91.3 fL (ref 78.0–100.0)
Monocytes Absolute: 0.9 10*3/uL (ref 0.1–1.0)
Monocytes Relative: 7 %
NEUTROS ABS: 11 10*3/uL — AB (ref 1.7–7.7)
NEUTROS PCT: 84 %
Platelets: 171 10*3/uL (ref 150–400)
RBC: 4.27 MIL/uL (ref 3.87–5.11)
RDW: 16.9 % — AB (ref 11.5–15.5)
WBC: 13.1 10*3/uL — AB (ref 4.0–10.5)

## 2017-08-23 LAB — GLUCOSE, CAPILLARY
GLUCOSE-CAPILLARY: 125 mg/dL — AB (ref 70–99)
GLUCOSE-CAPILLARY: 121 mg/dL — AB (ref 70–99)

## 2017-08-23 MED ORDER — RIZATRIPTAN BENZOATE 10 MG PO TABS
ORAL_TABLET | ORAL | 0 refills | Status: DC
Start: 2017-08-23 — End: 2018-01-20

## 2017-08-23 NOTE — Progress Notes (Signed)
Mother of patient presents to unit to pick up patient. Mother will provided transportation to patients home. All personal belongings with pt.

## 2017-08-23 NOTE — Progress Notes (Signed)
Mother of patient to hospital to provide tansportation for pt home. Mother states she does not wish to be a contact person on this patient's record any longer. Mother of patient states she is no longer available to provided assistance for patient in any capacity.
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
## 2017-08-23 NOTE — Evaluation (Signed)
Physical Therapy Evaluation Patient Details Name: Anne Black MRN: [id number] DOB: 1960/09/24 Today's Date: 08/23/2017   History of Present Illness  Pt is a 57 y.o. female admitted 08/21/17 with weakness and diarrhea. Troponin elevation likely demand ischemia. Also with chronic, nonhealing hernia repair surgical wound. Of note, recently hospitalized in [place] with alcohol withdrawal and COPD exacerbation. PMH includes alcohol induced liver cirrhosis, COPD, bipolar disorder, CAD.    Clinical Impression  Pt presents with an overall decrease in functional mobility secondary to above. PTA, pt lives alone; reports mod indep with RW for short distances, uses w/c for longer distances; reports 3x recent falls at home. Today, able to amb short distance with RW and min guard for balance; demonstrates poor insight into deficits and ability to self-correct instability. Discussed recommendation for short-term SNF-level therapies to maximize functional mobility and independence prior to return home; pt in agreement with this. Will follow acutely to address established goals.     Follow Up Recommendations SNF;Supervision for mobility/OOB    Equipment Recommendations  None recommended by PT    Recommendations for Other Services OT consult     Precautions / Restrictions Precautions Precautions: Fall Restrictions Weight Bearing Restrictions: No      Mobility  Bed Mobility Overal bed mobility: Needs Assistance Bed Mobility: Supine to Sit     Supine to sit: Supervision     General bed mobility comments: Requested assist for trunk elevation, able to do so with supervision and cues  Transfers Overall transfer level: Needs assistance Equipment used: Rolling walker (2 wheeled);None Transfers: Sit to/from Stand Sit to Stand: Min guard         General transfer comment: Performed 5x sit<>stand with no UE support and min guard for balance. Also completed with RW; correct hand  placement  Ambulation/Gait Ambulation/Gait assistance: Min guard Gait Distance (Feet): 20 Feet Assistive device: Rolling walker (2 wheeled) Gait Pattern/deviations: Step-to pattern;Trunk flexed;Drifts right/left Gait velocity: Decreased Gait velocity interpretation: <1.8 ft/sec, indicate of risk for recurrent falls General Gait Details: Slow amb with RW and min guard for balance. Poor navigation with RW, requiring increased time for turns and to correct when running into objects  Stairs            Wheelchair Mobility    Modified Rankin (Stroke Patients Only)       Balance Overall balance assessment: Needs assistance   Sitting balance-Leahy Scale: Fair       Standing balance-Leahy Scale: Fair Standing balance comment: Can static stand with no UE support and min guard; poor balance strategies                             Pertinent Vitals/Pain Pain Assessment: Faces Faces Pain Scale: Hurts a little bit Pain Location: Back Pain Descriptors / Indicators: Grimacing Pain Intervention(s): Monitored during session    Home Living Family/patient expects to be discharged to:: Private residence Living Arrangements: Alone Available Help at Discharge: Family;Available PRN/intermittently Type of Home: House Home Access: Level entry     Home Layout: Two level;Able to live on main level with bedroom/bathroom Home Equipment: Dan Humphreys - 2 wheels;Cane - single point;Wheelchair - manual      Prior Function Level of Independence: Independent with assistive device(s)         Comments: Reports primarily uses RW at home; limited household distances. Uses w/c "when I feel like I'm going to fall". Relies on electric scooter at stores  Hand Dominance        Extremity/Trunk Assessment        Lower Extremity Assessment Lower Extremity Assessment: RLE deficits/detail;LLE deficits/detail RLE Deficits / Details: Hip flex 4/5, knee flex 4/5, knee ext 5/5 LLE  Deficits / Details: Hip flex 4/5, knee flex 4/5, knee ext 5/5    Cervical / Trunk Assessment Cervical / Trunk Assessment: Kyphotic  Communication   Communication: No difficulties  Cognition Arousal/Alertness: Awake/alert Behavior During Therapy: Flat affect Overall Cognitive Status: History of cognitive impairments - at baseline Area of Impairment: Orientation;Attention;Memory;Safety/judgement;Awareness;Problem solving                 Orientation Level: Disoriented to;Time Current Attention Level: Sustained Memory: Decreased short-term memory   Safety/Judgement: Decreased awareness of safety;Decreased awareness of deficits Awareness: Emergent Problem Solving: Slow processing;Decreased initiation;Requires verbal cues        General Comments      Exercises     Assessment/Plan    PT Assessment Patient needs continued PT services  PT Problem List Decreased strength;Decreased activity tolerance;Decreased balance;Decreased mobility;Decreased cognition;Decreased knowledge of use of DME;Decreased safety awareness       PT Treatment Interventions Gait training;DME instruction;Stair training;Functional mobility training;Therapeutic activities;Therapeutic exercise;Balance training;Patient/family education;Wheelchair mobility training    PT Goals (Current goals can be found in the Care Plan section)  Acute Rehab PT Goals Patient Stated Goal: Get stronger at rehab PT Goal Formulation: With patient Time For Goal Achievement: 09/06/17 Potential to Achieve Goals: Good    Frequency Min 2X/week   Barriers to discharge Decreased caregiver support      Co-evaluation               AM-PAC PT "6 Clicks" Daily Activity  Outcome Measure Difficulty turning over in bed (including adjusting bedclothes, sheets and blankets)?: A Little Difficulty moving from lying on back to sitting on the side of the bed? : A Little Difficulty sitting down on and standing up from a chair with  arms (e.g., wheelchair, bedside commode, etc,.)?: A Little Help needed moving to and from a bed to chair (including a wheelchair)?: A Little Help needed walking in hospital room?: A Little Help needed climbing 3-5 steps with a railing? : A Lot 6 Click Score: 17    End of Session Equipment Utilized During Treatment: Gait belt Activity Tolerance: Patient limited by fatigue Patient left: in chair;with call bell/phone within reach;with chair alarm set Nurse Communication: Mobility status PT Visit Diagnosis: Other abnormalities of gait and mobility (R26.89);Repeated falls (R29.6)    Time: 3299-2426 PT Time Calculation (min) (ACUTE ONLY): 18 min   Charges:   PT Evaluation $PT Eval Moderate Complexity: 1 Mod     PT G Codes:       Anne Black, PT, DPT Acute Rehab Services  Pager: [phone]  Malachy Chamber 08/23/2017, 8:51 AM

## 2017-08-23 NOTE — Progress Notes (Addendum)
CM in to talk to patient about [facility] choices, patient stated that she wants to go for rehab; message sent to SW concerning SNF; B Shelba Flake [phone]  10:43 am - After patient talked to Heloise Ochoa now wants to go home; [facility] choices offered, pt chose [facility]; [facility] with [facility] called for arrangements. Abelino Derrick [facility] [phone]

## 2017-08-23 NOTE — Progress Notes (Signed)
Voicemail left for daughter to inform her that patient will discharge today.

## 2017-08-23 NOTE — Progress Notes (Signed)
Arrangements made for pt to be discharged to home via cab per SW. Family has not returned calls and mother is otherwise obligated. SW contacted to provide patient with cab pass. All d/c information, follow up appts and medications reviewed with patient.   All personal belongings with pt.  Patient makes no c/o at this time.

## 2017-08-23 NOTE — Evaluation (Signed)
Occupational Therapy Evaluation Patient Details Name: [facility] MRN: [id number] DOB: 08/25/1960 Today's Date: 08/23/2017    History of Present Illness Pt is a 57 y.o. female admitted 08/21/17 with weakness and diarrhea. Troponin elevation likely demand ischemia. Also with chronic, nonhealing hernia repair surgical wound. Of note, recently hospitalized in [place] with alcohol withdrawal and COPD exacerbation. PMH includes alcohol induced liver cirrhosis, COPD, bipolar disorder, CAD.   Clinical Impression   Pt admitted with the above diagnoses and presents with below problem list. Pt will benefit from continued acute OT to address the below listed deficits and maximize independence with basic ADLs prior to d/c to next venue. PTA pt was mod I with ADLs, reports some assistance with bathing/dressing at times. Pt is currently min guard to min A with LB ADLs, min guard with functional transfers/mobility. Educated on fall prevention strategies.       Follow Up Recommendations  Supervision/Assistance - 24 hour;SNF;Home health OT    Equipment Recommendations  None recommended by OT    Recommendations for Other Services       Precautions / Restrictions Precautions Precautions: Fall Restrictions Weight Bearing Restrictions: No      Mobility Bed Mobility Overal bed mobility: Needs Assistance Bed Mobility: Supine to Sit     Supine to sit: Min assist;HOB elevated     General bed mobility comments: Assist for trunk elevation. Trialed HHA but ultimately needed posterior trunk assist. Reports she sometimes sleeps in recliner at home  Transfers Overall transfer level: Needs assistance Equipment used: Rolling walker (2 wheeled) Transfers: Sit to/from Stand Sit to Stand: Min guard         General transfer comment: Performed 5x sit<>stand with no UE support and min guard for balance. Also completed with RW; correct hand placement    Balance Overall balance assessment: Needs  assistance   Sitting balance-Leahy Scale: Fair       Standing balance-Leahy Scale: Fair Standing balance comment: Can static stand with no UE support and min guard; poor balance strategies                           ADL either performed or assessed with clinical judgement   ADL Overall ADL's : Needs assistance/impaired Eating/Feeding: Set up;Sitting   Grooming: Sitting;Standing;Min guard;Set up   Upper Body Bathing: Set up;Sitting   Lower Body Bathing: Min guard;Minimal assistance;Sit to/from stand   Upper Body Dressing : Set up;Sitting   Lower Body Dressing: Min guard;Minimal assistance;Sit to/from stand   Toilet Transfer: Min guard;Ambulation;Comfort height toilet;RW   Toileting- Architect and Hygiene: Min guard;Sit to/from stand   Tub/ Shower Transfer: Walk-in shower;Minimal assistance;Min guard;Ambulation;Shower seat;Rolling walker   Functional mobility during ADLs: Min guard;Rolling walker General ADL Comments: Pt completed bed mobility, toilet transfer, pericare, and functional mobility as detailed above     Vision Baseline Vision/History: Wears glasses Additional Comments: Pt reports she no longer has her glasses.      Perception     Praxis      Pertinent Vitals/Pain Pain Assessment: Faces Faces Pain Scale: Hurts a little bit Pain Location: Back Pain Descriptors / Indicators: Grimacing Pain Intervention(s): Monitored during session;Repositioned     Hand Dominance Right   Extremity/Trunk Assessment Upper Extremity Assessment Upper Extremity Assessment: Overall WFL for tasks assessed;Generalized weakness   Lower Extremity Assessment Lower Extremity Assessment: Defer to PT evaluation RLE Deficits / Details: Hip flex 4/5, knee flex 4/5, knee ext 5/5 LLE Deficits /  Details: Hip flex 4/5, knee flex 4/5, knee ext 5/5   Cervical / Trunk Assessment Cervical / Trunk Assessment: Kyphotic   Communication Communication Communication:  No difficulties   Cognition Arousal/Alertness: Awake/alert(sleepy) Behavior During Therapy: Flat affect Overall Cognitive Status: History of cognitive impairments - at baseline Area of Impairment: Orientation;Attention;Memory;Safety/judgement;Awareness;Problem solving                 Orientation Level: Disoriented to;Time Current Attention Level: Sustained Memory: Decreased short-term memory   Safety/Judgement: Decreased awareness of safety;Decreased awareness of deficits Awareness: Emergent Problem Solving: Slow processing;Decreased initiation;Requires verbal cues     General Comments       Exercises     Shoulder Instructions      Home Living Family/patient expects to be discharged to:: Private residence Living Arrangements: Alone Available Help at Discharge: Family;Available PRN/intermittently Type of Home: House Home Access: Level entry     Home Layout: Two level;Able to live on main level with bedroom/bathroom     Bathroom Shower/Tub: Producer, television/film/video: Standard     Home Equipment: Environmental consultant - 2 wheels;Cane - single point;Wheelchair - Sport and exercise psychologist Comments: above per previous admission + pt reports shower seat      Prior Functioning/Environment Level of Independence: Independent with assistive device(s)        Comments: Mostly sponge bathes, occasionally gets in the shower. Reports some assistance at times with bathing/dressing. Reports primarily uses RW at home; limited household distances. Uses w/c "when I feel like I'm going to fall". Relies on electric scooter at stores        OT Problem List: Decreased activity tolerance;Impaired balance (sitting and/or standing);Decreased knowledge of use of DME or AE;Decreased knowledge of precautions;Decreased cognition      OT Treatment/Interventions: Self-care/ADL training;DME and/or AE instruction;Energy conservation;Therapeutic activities;Patient/family education;Balance  training    OT Goals(Current goals can be found in the care plan section) Acute Rehab OT Goals Patient Stated Goal: get stronger OT Goal Formulation: With patient Time For Goal Achievement: 09/06/17 Potential to Achieve Goals: Good ADL Goals Pt Will Perform Grooming: with modified independence;standing Pt Will Perform Lower Body Bathing: with modified independence;sit to/from stand Pt Will Perform Lower Body Dressing: with modified independence;sit to/from stand Pt Will Transfer to Toilet: with modified independence;ambulating Pt Will Perform Toileting - Clothing Manipulation and hygiene: with modified independence;sit to/from stand Pt Will Perform Tub/Shower Transfer: Shower transfer;with min guard assist;ambulating;shower seat;rolling walker Additional ADL Goal #1: Pt will complete bed mobility at supervision level to prepare for OOB ADLs.  OT Frequency: Min 2X/week   Barriers to D/C: Decreased caregiver support          Co-evaluation              AM-PAC PT "6 Clicks" Daily Activity     Outcome Measure Help from another person eating meals?: None Help from another person taking care of personal grooming?: A Little Help from another person toileting, which includes using toliet, bedpan, or urinal?: A Little Help from another person bathing (including washing, rinsing, drying)?: A Little Help from another person to put on and taking off regular upper body clothing?: None Help from another person to put on and taking off regular lower body clothing?: A Little 6 Click Score: 20   End of Session Equipment Utilized During Treatment: Rolling walker  Activity Tolerance: Patient tolerated treatment well;Patient limited by fatigue Patient left: in chair;with call bell/phone within reach;with chair alarm set  OT Visit Diagnosis: Other abnormalities  of gait and mobility (R26.89);Other symptoms and signs involving cognitive function;Muscle weakness (generalized) (M62.81)                 Time: 1610-9604 OT Time Calculation (min): 14 min Charges:  OT General Charges $OT Visit: 1 Visit OT Evaluation $OT Eval Low Complexity: 1 Low G-Codes:       Pilar Grammes 08/23/2017, 12:24 PM

## 2017-08-23 NOTE — Clinical Social Work Note (Signed)
Patient lives in [place]. Will have to get approval from Chiropodist of social work before Economist. Per RN, patient's mother is refusing to pick her up and she has been unable to get in touch with family. CSW tried calling patient's daughter. No answer and voicemail is full. Will keep trying. Patient stated she has no money to afford a cab.  Charlynn Court, CSW [phone]

## 2017-08-23 NOTE — Discharge Summary (Signed)
Physician Discharge Summary  [facility] ZOX:[id number] DOB: 1961-01-16 DOA: 08/21/2017  PCP: Doristine Bosworth, MD  Admit date: 08/21/2017 Discharge date: 08/23/2017  Admitted From: Home Disposition:  Home (refused SNF)  Recommendations for Outpatient Follow-up:  1. Follow up with PCP in 1-2 weeks, already has an appointment in 10 days 2. Please obtain BMP/CBC at the next appointment  Home Health: Home PT Equipment/Devices: None  Discharge Condition: Symptoms resolved CODE STATUS: Full Diet recommendation: Low sodium  HPI: Per Dr. Koren Bound MD Anne Black is a 57 y.o. female with complex medical history including alcoholism, liver cirrhosis with ascites, COPD, MS, bipolar disorder who presents with weakness after recent extended hospitalization for COPD exacerbation. She was recently evaluated at [facility] ED for similar symptoms and was discharged from ED per her request. Initially endorsed several symptoms to ED provider including chest pain but mainly described diarrhea and generalized weakness. Reports she has been taking augmentin at home which was prescribed after a 2 week hospitalization for reported COPD exacerbation at [facility] -- note, patient is uncertain of details regarding her frequent hospital encounters in the past year.  Reports "explosive" diarrhea (loose watery) x 1 week. Unable to perform ADLs due to generalized weakness and fatigue. Poor appetite. Denies drinking alcohol in the past week. States that she had slid off her couch and fell onto her back from a sitting position several times. No loss of consciousness or syncope. Currently lives alone and is unable to rely on her family members for help at this time. Baseline intermittently productive cough with associated chest tightness, but reports overall breathing has improved since last month. Denies fevers/chills.   Hospital Course: Generalized weakness -The patient was admitted with generalized weakness, which was  mutlifactorial arising from the combination of a recent prolonged hospital stay for COPD exacerbation as well as dehydration in the setting of diarrhea. PT evaluated the patient and recommended SNF, however patient declined.  Care management was consulted and she will be sent home with home health PT as well as Child psychotherapist. Alcoholic cirrhosis/alcohol dependence -Has a history of remote ascites but currently appears somewhat compensated, bilirubin is normal, LFTs are normal, she has normal creatinine. Will need to quit alcohol completely, however, the patient's plan to quit drinking is unclear at this time.  For now continue her rifaximin and diuretics. Diarrhea with leukocytosis -patient reported diarrhea, she had a white count of 20,000, however shortly after admission her diarrhea completely resolved.  We could not obtain a C. difficile.  It is possible that her diarrhea was related to recent treatment with Augmentin and leukocytosis due to presumed steroid use for her COPD exacerbation while hospitalized in [place].  Her white count improved.  She is able to eat without difficulties.  Her hypokalemia was repleted Troponin elevation -Initial troponin 0 0.05, repeat were undetectable x2, likely demand.  No further testing necessary.  2D echo unremarkable.    No events on telemetry COPD -No wheezing, this is stable, continue home medications  Discharge Diagnoses:  Active Problems:   Weakness  Discharge Instructions   Allergies as of 08/23/2017   No Known Allergies     Medication List    TAKE these medications   albuterol 108 (90 Base) MCG/ACT inhaler Commonly known as:  PROVENTIL HFA;VENTOLIN HFA Inhale 2 puffs into the lungs every 6 (six) hours as needed for wheezing or shortness of breath.   bismuth subsalicylate 262 MG chewable tablet Commonly known as:  PEPTO-BISMOL Chew 2  tablets (524 mg total) by mouth as needed for diarrhea or loose stools.   diltiazem 120 MG 24 hr  capsule Commonly known as:  TIAZAC Take 120 mg by mouth daily.   FLUoxetine 20 MG capsule Commonly known as:  PROZAC Take 20 mg by mouth daily.   fluticasone 50 MCG/ACT nasal spray Commonly known as:  FLONASE Place 1 spray into both nostrils as needed for allergies or rhinitis.   Fluticasone-Salmeterol 100-50 MCG/DOSE Aepb Commonly known as:  ADVAIR Inhale 1-2 puffs 2 (two) times daily into the lungs.   furosemide 40 MG tablet Commonly known as:  LASIX Take 1 tablet (40 mg total) by mouth daily.   gabapentin 100 MG capsule Commonly known as:  NEURONTIN Take 200 mg by mouth 3 (three) times daily.   hydrocortisone cream 1 % Apply topically 3 (three) times daily. What changed:    how much to take  when to take this   ibuprofen 800 MG tablet Commonly known as:  ADVIL,MOTRIN Take 800 mg by mouth 3 (three) times daily.   modafinil 200 MG tablet Commonly known as:  PROVIGIL Take 200 mg by mouth 2 (two) times daily.   ondansetron 8 MG disintegrating tablet Commonly known as:  ZOFRAN-ODT Take 8 mg by mouth every 8 (eight) hours as needed for nausea or vomiting. Place 8mg  inside cheek   oxyCODONE 5 MG immediate release tablet Commonly known as:  Oxy IR/ROXICODONE Take 5 mg by mouth every 4 (four) hours as needed for severe pain.   phenazopyridine 100 MG tablet Commonly known as:  PYRIDIUM Take 100 mg by mouth 3 (three) times daily with meals.   rifaximin 550 MG Tabs tablet Commonly known as:  XIFAXAN Take 550 mg by mouth 2 (two) times daily.   risperiDONE 0.5 MG tablet Commonly known as:  RISPERDAL Take 1 tablet (0.5 mg total) by mouth 3 (three) times daily as needed (agitation). What changed:    when to take this  reasons to take this   rizatriptan 10 MG tablet Commonly known as:  MAXALT Take 10mg  by mouth every 2 hours as needed for migraine.  May repeat in 2 hours and max 24 hour dose is 30mg .   spironolactone 100 MG tablet Commonly known as:   ALDACTONE Take 1 tablet (100 mg total) by mouth daily.      Follow-up Information    Doristine Bosworth, MD. Go on 08/31/2017.   Specialty:  Internal Medicine Why:  @11 :20am Contact information: [address] [place] [address] [phone]        [facility] - Dme Follow up.   Why:  They will do your home health care at your home Contact information: [address] [place] [address] [phone]           Consultations: None  Procedures/Studies:  2D echo EF 55-60%. Normal LV systolic function; mild diastolic dysfunction; trace MR and TR.  Dg Chest 2 View  Result Date: 08/21/2017 CLINICAL DATA:  from home with complaints of back pain, recent falls, nausea, HA, chest tightness. Pt was discharged Friday after spending 2 weeks in the hospital due to COPD EXAM: CHEST - 2 VIEW COMPARISON:  06/12/2017 FINDINGS: Cardiac silhouette is normal in size. No mediastinal or hilar masses. No evidence of adenopathy. Clear lungs. No pleural effusion or pneumothorax. Skeletal structures are intact. IMPRESSION: No active cardiopulmonary disease. Electronically Signed   By: Amie Portland M.D.   On: 08/21/2017 19:03   Dg Lumbar  Spine Complete  Result Date: 08/21/2017 CLINICAL DATA:  from home with complaints of back pain, recent falls, nausea, HA, chest tightness. Pt was discharged Friday after spending 2 weeks in the hospital due to COPD EXAM: LUMBAR SPINE - COMPLETE 4+ VIEW COMPARISON:  CT, 06/12/2017 FINDINGS: No fracture. No spondylolisthesis. Mild loss of disc height at L4-L5. Minor endplate osteophytes from L2 through L5. Mild facet degenerative change in the lower lumbar spine. Scattered aortic atherosclerotic calcifications. Soft tissues are otherwise unremarkable. IMPRESSION: 1. No fracture or acute finding.  Mild degenerative changes. Electronically Signed   By: Amie Portland M.D.   On: 08/21/2017 19:05      Subjective: Patient appears well. Negative ROS.  Wants to return home.  Discharge Exam: Vitals:   08/23/17 0001 08/23/17 0513  BP: (!) 112/58 (!) 109/56  Pulse: 94 93  Resp: 18 17  Temp: 99.5 F (37.5 C) 99.5 F (37.5 C)  SpO2: 95% 95%    General: Pt is alert, awake, not in acute distress Cardiovascular: RRR, S1/S2 +, no rubs, no gallops Respiratory: CTA bilaterally, no wheezing, no rhonchi. Diffuse diminished breath sounds Abdominal: Soft, NT, ND, bowel sounds + Extremities: no edema, no cyanosis    The results of significant diagnostics from this hospitalization (including imaging, microbiology, ancillary and laboratory) are listed below for reference.     Microbiology: No results found for this or any previous visit (from the past 240 hour(s)).   Labs: BNP (last 3 results) Recent Labs    12/09/16 0041 06/12/17 1005  BNP 63.7 115.0*   Basic Metabolic Panel: Recent Labs  Lab 08/21/17 1850 08/21/17 2154 08/22/17 0420 08/23/17 0710  NA 137  --  139 137  K 2.9*  --  3.5 3.4*  CL 103  --  104 102  CO2 24  --  26 28  GLUCOSE 82  --  93 123*  BUN <5*  --  5* <5*  CREATININE 0.37* 0.49 0.42* 0.52  CALCIUM 7.7*  --  8.6* 8.7*   Liver Function Tests: Recent Labs  Lab 08/21/17 1850 08/22/17 0420 08/23/17 0710  AST 21 17 17   ALT 25 22 19   ALKPHOS 102 93 96  BILITOT 1.2 1.1 1.0  PROT 5.3* 5.0* 5.3*  ALBUMIN 2.9* 2.7* 2.9*   No results for input(s): LIPASE, AMYLASE in the last 168 hours. No results for input(s): AMMONIA in the last 168 hours. CBC: Recent Labs  Lab 08/21/17 1850 08/21/17 2154 08/22/17 0420 08/23/17 0710  WBC 20.1* 20.4* 14.3* 13.1*  NEUTROABS 18.0*  --  12.2* 11.0*  HGB 11.7* 11.1* 11.2* 11.6*  HCT 39.6 37.0 37.3 39.0  MCV 92.1 91.8 91.4 91.3  PLT 170 166 162 171   Cardiac Enzymes: Recent Labs  Lab 08/21/17 1850 08/21/17 2154 08/22/17 0420  TROPONINI 0.05* <0.03 <0.03   BNP: Invalid input(s): POCBNP CBG: Recent Labs  Lab 08/22/17 0801 08/22/17 1107 08/22/17 2110  08/23/17 0735 08/23/17 1111  GLUCAP 93 94 104* 125* 121*   D-Dimer No results for input(s): DDIMER in the last 72 hours. Hgb A1c No results for input(s): HGBA1C in the last 72 hours. Lipid Profile No results for input(s): CHOL, HDL, LDLCALC, TRIG, CHOLHDL, LDLDIRECT in the last 72 hours. Thyroid function studies No results for input(s): TSH, T4TOTAL, T3FREE, THYROIDAB in the last 72 hours.  Invalid input(s): FREET3 Anemia work up No results for input(s): VITAMINB12, FOLATE, FERRITIN, TIBC, IRON, RETICCTPCT in the last 72 hours. Urinalysis    Component Value Date/Time  COLORURINE YELLOW 08/21/2017 2031   APPEARANCEUR CLEAR 08/21/2017 2031   LABSPEC 1.010 08/21/2017 2031   PHURINE 6.0 08/21/2017 2031   GLUCOSEU NEGATIVE 08/21/2017 2031   HGBUR NEGATIVE 08/21/2017 2031   BILIRUBINUR NEGATIVE 08/21/2017 2031   BILIRUBINUR moderate (A) 06/19/2015 1906   KETONESUR NEGATIVE 08/21/2017 2031   PROTEINUR NEGATIVE 08/21/2017 2031   UROBILINOGEN >=8.0 06/19/2015 1906   UROBILINOGEN 0.2 09/14/2011 2141   NITRITE NEGATIVE 08/21/2017 2031   LEUKOCYTESUR SMALL (A) 08/21/2017 2031   Sepsis Labs Invalid input(s): PROCALCITONIN,  WBC,  LACTICIDVEN   Time coordinating discharge: 25 minutes  SIGNED:  Pamella Pert, MD  [facility] 08/23/2017, 11:19 AM Pager [phone]  If 7PM-7AM, please contact night-coverage [email or web address] Password [email or web address]

## 2017-08-23 NOTE — Discharge Instructions (Signed)
Follow with Forrest Moron, MD in 5-7 days  Please get a complete blood count and chemistry panel checked by your Primary MD at your next visit, and again as instructed by your Primary MD. Please get your medications reviewed and adjusted by your Primary MD.  Please request your Primary MD to go over all Hospital Tests and Procedure/Radiological results at the follow up, please get all Hospital records sent to your Prim MD by signing hospital release before you go home.  If you had Pneumonia of Lung problems at the Hospital: Please get a 2 view Chest X ray done in 6-8 weeks after hospital discharge or sooner if instructed by your Primary MD.  If you have Congestive Heart Failure: Please call your Cardiologist or Primary MD anytime you have any of the following symptoms:  1) 3 pound weight gain in 24 hours or 5 pounds in 1 week  2) shortness of breath, with or without a dry hacking cough  3) swelling in the hands, feet or stomach  4) if you have to sleep on extra pillows at night in order to breathe  Follow cardiac low salt diet and 1.5 lit/day fluid restriction.  If you have diabetes Accuchecks 4 times/day, Once in AM empty stomach and then before each meal. Log in all results and show them to your primary doctor at your next visit. If any glucose reading is under 80 or above 300 call your primary MD immediately.  If you have Seizure/Convulsions/Epilepsy: Please do not drive, operate heavy machinery, participate in activities at heights or participate in high speed sports until you have seen by Primary MD or a Neurologist and advised to do so again.  If you had Gastrointestinal Bleeding: Please ask your Primary MD to check a complete blood count within one week of discharge or at your next visit. Your endoscopic/colonoscopic biopsies that are pending at the time of discharge, will also need to followed by your Primary MD.  Get Medicines reviewed and adjusted. Please take all your  medications with you for your next visit with your Primary MD  Please request your Primary MD to go over all hospital tests and procedure/radiological results at the follow up, please ask your Primary MD to get all Hospital records sent to his/her office.  If you experience worsening of your admission symptoms, develop shortness of breath, life threatening emergency, suicidal or homicidal thoughts you must seek medical attention immediately by calling 911 or calling your MD immediately  if symptoms less severe.  You must read complete instructions/literature along with all the possible adverse reactions/side effects for all the Medicines you take and that have been prescribed to you. Take any new Medicines after you have completely understood and accpet all the possible adverse reactions/side effects.   Do not drive or operate heavy machinery when taking Pain medications.   Do not take more than prescribed Pain, Sleep and Anxiety Medications  Special Instructions: If you have smoked or chewed Tobacco  in the last 2 yrs please stop smoking, stop any regular Alcohol  and or any Recreational drug use.  Wear Seat belts while driving.  Please note You were cared for by a hospitalist during your hospital stay. If you have any questions about your discharge medications or the care you received while you were in the hospital after you are discharged, you can call the unit and asked to speak with the hospitalist on call if the hospitalist that took care of you is not available.  Once you are discharged, your primary care physician will handle any further medical issues. Please note that NO REFILLS for any discharge medications will be authorized once you are discharged, as it is imperative that you return to your primary care physician (or establish a relationship with a primary care physician if you do not have one) for your aftercare needs so that they can reassess your need for medications and monitor your  lab values.  You can reach the hospitalist office at phone [phone] or fax [phone]   If you do not have a primary care physician, you can call [phone] for a physician referral.  Activity: As tolerated with Full fall precautions use walker/cane & assistance as needed  Diet: heart healthy, low salt  Disposition Home

## 2017-08-24 LAB — URINE CULTURE

## 2017-08-25 ENCOUNTER — Telehealth: Payer: Self-pay | Admitting: *Deleted

## 2017-08-25 ENCOUNTER — Telehealth: Payer: Self-pay | Admitting: Family Medicine

## 2017-08-25 NOTE — Telephone Encounter (Signed)
Patient called [facility]  Wanting prescription refills patient was advised by [facility] she needed an appointment.  Patient hung up on [facility] so was unable to speak to patient.     Per all notes if patient does call back, she needs an appointment before any prescriptions can be filled.

## 2017-08-25 NOTE — Telephone Encounter (Signed)
Copied from CRM [id number]. Topic: General - Other >> Aug 25, 2017 12:51 PM Debroah Loop wrote: Reason for CRM: Marchelle Folks, Nurse Case Manager with [facility] calling to advise the patient declined their services.

## 2017-08-28 ENCOUNTER — Emergency Department (HOSPITAL_COMMUNITY): Payer: No Typology Code available for payment source

## 2017-08-28 ENCOUNTER — Emergency Department (HOSPITAL_COMMUNITY)
Admission: EM | Admit: 2017-08-28 | Discharge: 2017-08-28 | Discharge status: Against Medical Advice | Payer: No Typology Code available for payment source | Attending: Emergency Medicine | Admitting: Emergency Medicine

## 2017-08-28 ENCOUNTER — Encounter (HOSPITAL_COMMUNITY): Payer: Self-pay | Admitting: Emergency Medicine

## 2017-08-28 DIAGNOSIS — S31109A Unspecified open wound of abdominal wall, unspecified quadrant without penetration into peritoneal cavity, initial encounter: Secondary | ICD-10-CM | POA: Insufficient documentation

## 2017-08-28 DIAGNOSIS — X58XXXA Exposure to other specified factors, initial encounter: Secondary | ICD-10-CM | POA: Insufficient documentation

## 2017-08-28 DIAGNOSIS — Y929 Unspecified place or not applicable: Secondary | ICD-10-CM | POA: Insufficient documentation

## 2017-08-28 DIAGNOSIS — R0602 Shortness of breath: Secondary | ICD-10-CM

## 2017-08-28 DIAGNOSIS — R197 Diarrhea, unspecified: Secondary | ICD-10-CM | POA: Diagnosis not present

## 2017-08-28 DIAGNOSIS — R0902 Hypoxemia: Secondary | ICD-10-CM | POA: Insufficient documentation

## 2017-08-28 DIAGNOSIS — E876 Hypokalemia: Secondary | ICD-10-CM | POA: Diagnosis not present

## 2017-08-28 DIAGNOSIS — J449 Chronic obstructive pulmonary disease, unspecified: Secondary | ICD-10-CM | POA: Diagnosis not present

## 2017-08-28 DIAGNOSIS — Y905 Blood alcohol level of 100-119 mg/100 ml: Secondary | ICD-10-CM | POA: Insufficient documentation

## 2017-08-28 DIAGNOSIS — Z79899 Other long term (current) drug therapy: Secondary | ICD-10-CM | POA: Diagnosis not present

## 2017-08-28 DIAGNOSIS — Y999 Unspecified external cause status: Secondary | ICD-10-CM | POA: Insufficient documentation

## 2017-08-28 DIAGNOSIS — Z5329 Procedure and treatment not carried out because of patient's decision for other reasons: Secondary | ICD-10-CM

## 2017-08-28 DIAGNOSIS — Z5320 Procedure and treatment not carried out because of patient's decision for unspecified reasons: Secondary | ICD-10-CM | POA: Insufficient documentation

## 2017-08-28 DIAGNOSIS — F17210 Nicotine dependence, cigarettes, uncomplicated: Secondary | ICD-10-CM | POA: Diagnosis not present

## 2017-08-28 DIAGNOSIS — F1010 Alcohol abuse, uncomplicated: Secondary | ICD-10-CM | POA: Insufficient documentation

## 2017-08-28 DIAGNOSIS — Y939 Activity, unspecified: Secondary | ICD-10-CM | POA: Insufficient documentation

## 2017-08-28 DIAGNOSIS — F1011 Alcohol abuse, in remission: Secondary | ICD-10-CM

## 2017-08-28 DIAGNOSIS — Z8709 Personal history of other diseases of the respiratory system: Secondary | ICD-10-CM

## 2017-08-28 LAB — URINALYSIS, ROUTINE W REFLEX MICROSCOPIC
BILIRUBIN URINE: NEGATIVE
GLUCOSE, UA: NEGATIVE mg/dL
KETONES UR: NEGATIVE mg/dL
Leukocytes, UA: NEGATIVE
NITRITE: NEGATIVE
Protein, ur: NEGATIVE mg/dL
Specific Gravity, Urine: 1.008 (ref 1.005–1.030)
pH: 5 (ref 5.0–8.0)

## 2017-08-28 LAB — CBC
HCT: 38.1 % (ref 36.0–46.0)
Hemoglobin: 12.1 g/dL (ref 12.0–15.0)
MCH: 28.1 pg (ref 26.0–34.0)
MCHC: 31.8 g/dL (ref 30.0–36.0)
MCV: 88.4 fL (ref 78.0–100.0)
PLATELETS: 240 10*3/uL (ref 150–400)
RBC: 4.31 MIL/uL (ref 3.87–5.11)
RDW: 16.7 % — AB (ref 11.5–15.5)
WBC: 14.4 10*3/uL — AB (ref 4.0–10.5)

## 2017-08-28 LAB — BRAIN NATRIURETIC PEPTIDE: B Natriuretic Peptide: 49.6 pg/mL (ref 0.0–100.0)

## 2017-08-28 LAB — BASIC METABOLIC PANEL
Anion gap: 14 (ref 5–15)
CO2: 30 mmol/L (ref 22–32)
Calcium: 8.2 mg/dL — ABNORMAL LOW (ref 8.9–10.3)
Chloride: 97 mmol/L — ABNORMAL LOW (ref 98–111)
GLUCOSE: 127 mg/dL — AB (ref 70–99)
Potassium: 2.6 mmol/L — CL (ref 3.5–5.1)
SODIUM: 141 mmol/L (ref 135–145)

## 2017-08-28 LAB — ETHANOL: Alcohol, Ethyl (B): 112 mg/dL — ABNORMAL HIGH (ref <10)

## 2017-08-28 LAB — MAGNESIUM: Magnesium: 1.4 mg/dL — ABNORMAL LOW (ref 1.7–2.4)

## 2017-08-28 LAB — I-STAT TROPONIN, ED: Troponin i, poc: 0 ng/mL (ref 0.00–0.08)

## 2017-08-28 LAB — HEPATIC FUNCTION PANEL
ALT: 20 U/L (ref 0–44)
AST: 26 U/L (ref 15–41)
Albumin: 3.2 g/dL — ABNORMAL LOW (ref 3.5–5.0)
Alkaline Phosphatase: 115 U/L (ref 38–126)
BILIRUBIN INDIRECT: 0.4 mg/dL (ref 0.3–0.9)
Bilirubin, Direct: 0.3 mg/dL — ABNORMAL HIGH (ref 0.0–0.2)
Total Bilirubin: 0.7 mg/dL (ref 0.3–1.2)
Total Protein: 6.6 g/dL (ref 6.5–8.1)

## 2017-08-28 LAB — LIPASE, BLOOD: Lipase: 27 U/L (ref 11–51)

## 2017-08-28 MED ORDER — MAGNESIUM SULFATE 2 GM/50ML IV SOLN
2.0000 g | Freq: Once | INTRAVENOUS | Status: AC
  Administered 2017-08-28: 2 g via INTRAVENOUS
  Filled 2017-08-28: qty 50

## 2017-08-28 MED ORDER — POTASSIUM CHLORIDE 10 MEQ/100ML IV SOLN
10.0000 meq | INTRAVENOUS | Status: AC
  Administered 2017-08-28 (×2): 10 meq via INTRAVENOUS
  Filled 2017-08-28 (×2): qty 100

## 2017-08-28 MED ORDER — ONDANSETRON HCL 4 MG/2ML IJ SOLN
4.0000 mg | Freq: Once | INTRAMUSCULAR | Status: AC
  Administered 2017-08-28: 4 mg via INTRAVENOUS
  Filled 2017-08-28: qty 2

## 2017-08-28 MED ORDER — POTASSIUM CHLORIDE CRYS ER 20 MEQ PO TBCR
40.0000 meq | EXTENDED_RELEASE_TABLET | Freq: Once | ORAL | Status: AC
  Administered 2017-08-28: 40 meq via ORAL
  Filled 2017-08-28: qty 2

## 2017-08-28 MED ORDER — LORAZEPAM 2 MG/ML IJ SOLN
1.0000 mg | Freq: Once | INTRAMUSCULAR | Status: AC
  Administered 2017-08-28: 1 mg via INTRAVENOUS
  Filled 2017-08-28: qty 1

## 2017-08-28 MED ORDER — POTASSIUM CHLORIDE CRYS ER 20 MEQ PO TBCR: 20.0000 meq | EXTENDED_RELEASE_TABLET | Freq: Every day | ORAL | 0 refills | Status: DC

## 2017-08-28 MED ORDER — MAGNESIUM SULFATE 50 % IJ SOLN: 2.0000 g | Freq: Once | INTRAMUSCULAR | Status: DC

## 2017-08-28 MED ORDER — FENTANYL CITRATE (PF) 100 MCG/2ML IJ SOLN
50.0000 ug | Freq: Once | INTRAMUSCULAR | Status: AC
  Administered 2017-08-28: 50 ug via INTRAVENOUS
  Filled 2017-08-28: qty 2

## 2017-08-28 MED ORDER — IOPAMIDOL (ISOVUE-370) INJECTION 76%
INTRAVENOUS | Status: AC
  Filled 2017-08-28: qty 100

## 2017-08-28 MED ORDER — SODIUM CHLORIDE 0.9 % IV BOLUS
500.0000 mL | Freq: Once | INTRAVENOUS | Status: AC
  Administered 2017-08-28: 500 mL via INTRAVENOUS

## 2017-08-28 MED ORDER — IOPAMIDOL (ISOVUE-370) INJECTION 76%
100.0000 mL | Freq: Once | INTRAVENOUS | Status: AC | PRN
  Administered 2017-08-28: 100 mL via INTRAVENOUS

## 2017-08-28 MED ORDER — SODIUM CHLORIDE 0.9 % IV BOLUS
500.0000 mL | Freq: Once | INTRAVENOUS | Status: AC
Start: 2017-08-28 — End: 2017-08-28
  Administered 2017-08-28: 500 mL via INTRAVENOUS

## 2017-08-28 NOTE — ED Notes (Signed)
Pt. Denied chest pain, stated that she had her "drinks " last night at 6pm . Stated her back hurts lately , denied fall. Denied SOB, on [place] at 3L/min.

## 2017-08-28 NOTE — ED Triage Notes (Signed)
Pt brought by EMS for severe nausea. Pt reports with ShoB, non-healing abd wound, nausea, and requesting ETOH detox admission.   5mg  albuterol neb BP: 120/76 P:82 RR:20 O2: 89 RA CBG:162

## 2017-08-28 NOTE — ED Notes (Signed)
Patient pull her IV's and got out of the bed. Patient have yellow sock. Patient is using the phone try to call for someone to pick her up.

## 2017-08-28 NOTE — ED Notes (Signed)
Patient been yelling at the nurse  and calling on call light every min. Patient want to leave AMA. Patient been very rude.

## 2017-08-28 NOTE — ED Provider Notes (Signed)
[facility]-EMERGENCY DEPT Provider Note   CSN: [id number] Arrival date & time: 08/28/17  3329     History   Chief Complaint Chief Complaint  Patient presents with  . Shortness of Breath    HPI Anne Black is a 57 y.o. female with a past medical history of atrial flutter, bipolar disorder, cirrhosis, COPD, multiple sclerosis, alcohol abuse who presents emergency department today for multiple complaints.  Per chart review patient was recently admitted on 7/14 due to a elevated troponin as well as hypo-kalemia.  During admission patient's troponins were cycled and were normal.  She had echocardiogram that was reassuring and showed an EF of 55-60% with normal LV systolic function, mild diastolic dysfunction, trace MR and TR.  She reports at that time she had loose watery diarrhea x1 week but stool cultures are not able to be obtained.  She was discharged home in good condition.  Patient reports that she is continued to drink heavily at home.  She reports she had 6 glasses of wine around 6 PM last night.  She notes she normally drinks approximately 12 drinks per day.  She notes that this morning around 3 AM she started developing severe nausea and called EMS to receive some Zofran.  She notes when she arrived she also felt very short of breath.  She notes that she has been increasing short of breath over the last 2-3 days.  She notes that this occurs with exertion but also with rest.  She notes she becomes short of breath with regular ADLs.  She has been taking her home Lasix as well as albuterol inhalers for this without any relief.  Patient reports no fever, cough, hemoptysis, chest pain or lower leg swelling associated with this.  Patient received 5 mg albuterol in route without any relief.  She was noted to have an oxygen saturation of 89% on arrival.  She reports she does not usually wear home O2.  She was placed on 3 L of O2 with improvement of her hypoxia.   Patient  also reports a chronic nonhealing wound on her abdomen.  There is no surrounding area of erythema, heat.  She denies any drainage.  Patient also reports that she has had some loose watery diarrhea that is continued since discharge.  She reports 2 episodes yesterday as well as one this morning.  She denies any associated abdominal pain.  She does report this started after a course of Augmentin and after discharge from the hospital for a COPD exacerbation.  Patient reports some increased urinary frequency as well as dysuria.  She denies any flank pain, hematuria, or urinary urgency.    Patient reports chronic back pain.  She denies any recent falls.  No bowel/bladder incontinence, urinary retention, numbness/tingling/weakness of the lower extremities.  She notes able gait.  No trauma.  HPI  Past Medical History:  Diagnosis Date  . ADHD (attention deficit hyperactivity disorder)   . Alcoholism (HCC)   . Anxiety   . Arthritis   . Ascites   . Bipolar disorder (HCC)   . Cirrhosis (HCC)   . COPD (chronic obstructive pulmonary disease) (HCC)   . Hypokalemia   . Migraine   . Multiple sclerosis (HCC)   . Narcolepsy   . Osteoporosis     Patient Active Problem List   Diagnosis Date Noted  . Weakness 08/21/2017  . COPD exacerbation (HCC) 06/12/2017  . Chest pain with moderate risk for cardiac etiology 05/30/2017  . Abnormal  LFTs 05/30/2017  . Tachycardia 05/30/2017  . COPD (chronic obstructive pulmonary disease) (HCC) 04/19/2017  . Right rib fracture 04/15/2017  . Abdominal wall cellulitis 12/30/2016  . S/P exploratory laparotomy 12/30/2016  . Distal radius fracture, left 09/09/2016  . Thrombocytopenia (HCC) 09/09/2016  . Chest x-ray abnormality   . Umbilical hernia without obstruction and without gangrene 05/12/2016  . Itching 02/24/2016  . Ventral hernia without obstruction or gangrene 02/24/2016  . Palliative care encounter   . Ascites   . Tachypnea   . Atrial flutter with rapid  ventricular response (HCC)   . Hypomagnesemia   . Hypoxia   . Dehydration 01/22/2016  . Low back ache 12/30/2015  . Non-intractable vomiting with nausea   . Alcohol abuse with alcohol-induced mood disorder (HCC) 12/15/2015  . Acute alcoholism (HCC)   . Abdominal distension 11/04/2015  . Alcohol abuse   . Dyspnea   . Acute respiratory failure (HCC) 10/15/2015  . Ascites due to alcoholic cirrhosis (HCC) 10/15/2015  . Osteoporosis   . Bipolar I disorder (HCC)   . Multiple sclerosis (HCC)   . Alcoholic cirrhosis of liver with ascites (HCC) 08/22/2015  . Bipolar disorder, current episode mixed, moderate (HCC) 08/01/2015  . Alcohol use disorder, severe, dependence (HCC) 07/31/2015  . Macrocytic anemia- due to alcohol abuse with normal B12 & folate levels 05/31/2015  . Severe protein-calorie malnutrition (HCC) 05/31/2015  . Hypokalemia 05/26/2015  . Encephalopathy, hepatic (HCC) 05/26/2015  . Alcohol withdrawal (HCC) 05/18/2015  . Abdominal pain 05/18/2015  . Anxiety 05/18/2015  . UTI (lower urinary tract infection) 05/18/2015  . Stimulant abuse (HCC) 12/27/2014  . Nicotine abuse 12/27/2014  . Hyperprolactinemia (HCC) 11/15/2014  . Dyslipidemia   . Tobacco abuse 01/23/2014  . Noncompliance with therapeutic plan 04/04/2013  . Hereditary and idiopathic peripheral neuropathy 05/01/2012  . GERD (gastroesophageal reflux disease) 05/01/2012  . Osteoarthrosis, unspecified whether generalized or localized, involving lower leg 05/01/2012  . Hyponatremia 07/01/2011    Past Surgical History:  Procedure Laterality Date  . ABDOMINAL WALL DEFECT REPAIR N/A 12/30/2016   Procedure: EXPLORATORY LAPAROTOMY WITH REPAIR ABDOMINAL WALL VENTRAL HERNIA;  Surgeon: [name], MD;  Location: [facility] OR;  Service: General;  Laterality: N/A;  . APPLICATION OF WOUND VAC N/A 12/30/2016   Procedure: APPLICATION OF WOUND VAC;  Surgeon: [name], MD;  Location: [facility] OR;  Service: General;   Laterality: N/A;  . CESAREAN SECTION  [phone]  . FRACTURE SURGERY    . HERNIA REPAIR    . MYRINGOTOMY WITH TUBE PLACEMENT Bilateral   . ORIF WRIST FRACTURE Left 09/09/2016   Procedure: OPEN REDUCTION INTERNAL FIXATION (ORIF) LEFT WRIST FRACTURE, LEFT CARPAL TUNNEL RELEASE;  Surgeon: Dominica Severin, MD;  Location: [facility] ORS;  Service: Orthopedics;  Laterality: Left;  . TONSILLECTOMY       OB History   None      Home Medications    Prior to Admission medications   Medication Sig Start Date End Date Taking? Authorizing Provider  albuterol (PROVENTIL HFA;VENTOLIN HFA) 108 (90 Base) MCG/ACT inhaler Inhale 2 puffs into the lungs every 6 (six) hours as needed for wheezing or shortness of breath.    [provider]  bismuth subsalicylate (PEPTO-BISMOL) 262 MG chewable tablet Chew 2 tablets (524 mg total) by mouth as needed for diarrhea or loose stools. 07/08/17   Molpus, John, MD  diltiazem (TIAZAC) 120 MG 24 hr capsule Take 120 mg by mouth daily.    [provider]  FLUoxetine (PROZAC) 20 MG capsule  Take 20 mg by mouth daily.  03/30/16   [provider]  fluticasone (FLONASE) 50 MCG/ACT nasal spray Place 1 spray into both nostrils as needed for allergies or rhinitis.    [provider]  Fluticasone-Salmeterol (ADVAIR) 100-50 MCG/DOSE AEPB Inhale 1-2 puffs 2 (two) times daily into the lungs.  11/05/14   [provider]  furosemide (LASIX) 40 MG tablet Take 1 tablet (40 mg total) by mouth daily. 06/17/16   Midge Minium, MD  gabapentin (NEURONTIN) 100 MG capsule Take 200 mg by mouth 3 (three) times daily.  06/02/17   [provider]  hydrocortisone cream 1 % Apply topically 3 (three) times daily. Patient taking differently: Apply 1 application topically 2 (two) times daily.  04/20/17   Sheikh, Omair Latif, DO  modafinil (PROVIGIL) 200 MG tablet Take 200 mg by mouth 2 (two) times daily.  08/04/16   [provider]  ondansetron (ZOFRAN-ODT) 8  MG disintegrating tablet Take 8 mg by mouth every 8 (eight) hours as needed for nausea or vomiting. Place 8mg  inside cheek    [provider]  oxyCODONE (OXY IR/ROXICODONE) 5 MG immediate release tablet Take 5 mg by mouth every 4 (four) hours as needed for severe pain.    [provider]  phenazopyridine (PYRIDIUM) 100 MG tablet Take 100 mg by mouth 3 (three) times daily with meals.    [provider]  rifaximin (XIFAXAN) 550 MG TABS tablet Take 550 mg by mouth 2 (two) times daily.    [provider]  risperiDONE (RISPERDAL) 0.5 MG tablet Take 1 tablet (0.5 mg total) by mouth 3 (three) times daily as needed (agitation). Patient taking differently: Take 0.5 mg by mouth every 8 (eight) hours as needed (use for delirium).  02/06/16   Renne Musca, MD  rizatriptan (MAXALT) 10 MG tablet Take 10mg  by mouth every 2 hours as needed for migraine.  May repeat in 2 hours and max 24 hour dose is 30mg . 08/23/17   Gherghe, Daylene Katayama, MD  spironolactone (ALDACTONE) 100 MG tablet Take 1 tablet (100 mg total) by mouth daily. 11/23/16   Derwood Kaplan, MD    Family History Family History  Problem Relation Age of Onset  . Arrhythmia Mother   . Heart disease Father   . Hypertension Father     Social History Social History   Tobacco Use  . Smoking status: Current Every Day Smoker    Packs/day: 1.00    Years: 35.00    Pack years: 35.00    Types: Cigarettes  . Smokeless tobacco: Never Used  Substance Use Topics  . Alcohol use: Not Currently  . Drug use: No     Allergies   Patient has no known allergies.   Review of Systems Review of Systems  All other systems reviewed and are negative.    Physical Exam Updated Vital Signs BP 109/60 (BP Location: Right Arm)   Pulse (!) 103   Temp 98.4 F (36.9 C) (Oral)   Resp 16   SpO2 94%   Physical Exam  Constitutional: She appears well-developed and well-nourished.  HENT:  Head: Normocephalic and atraumatic.    Right Ear: External ear normal.  Left Ear: External ear normal.  Nose: Nose normal.  Mouth/Throat: Uvula is midline and oropharynx is clear and moist. Mucous membranes are cyanotic. No tonsillar exudate.  Eyes: Pupils are equal, round, and reactive to light. Right eye exhibits no discharge. Left eye exhibits no discharge. No scleral icterus.  Neck: Trachea  normal. Neck supple. No JVD present. No spinous process tenderness present. Carotid bruit is not present. No neck rigidity. Normal range of motion present.  Cardiovascular: Normal rate, regular rhythm and intact distal pulses.  No murmur heard. Pulses:      Radial pulses are 2+ on the right side, and 2+ on the left side.       Dorsalis pedis pulses are 2+ on the right side, and 2+ on the left side.       Posterior tibial pulses are 2+ on the right side, and 2+ on the left side.  No lower extremity swelling or edema. Calves symmetric in size bilaterally.  Pulmonary/Chest: Effort normal. Tachypnea noted. She has decreased breath sounds. She has no wheezes. She has no rhonchi. She has no rales. She exhibits no tenderness.  Patient on 3L of O2. Noted hypoxia in mid 80's when weaned off  Abdominal: Soft. Bowel sounds are normal. There is no tenderness. There is no rigidity, no rebound, no guarding and no CVA tenderness.  Musculoskeletal: She exhibits no edema.  No cervical, thoracic or lumbar spinous tenderness palpation.  Bilateral paraspinal tenderness palpation of the lumbar spine.  Sensation strength intact to the lower extremities.   Lymphadenopathy:    She has no cervical adenopathy.  Neurological: She is alert. She has normal strength. No sensory deficit.  Skin: Skin is warm and dry. No rash noted. She is not diaphoretic.  Chronic wound on abdomen  Psychiatric: She has a normal mood and affect.  Nursing note and vitals reviewed.     ED Treatments / Results  Labs (all labs ordered are listed, but only abnormal results are  displayed) Labs Reviewed  BASIC METABOLIC PANEL - Abnormal; Notable for the following components:      Result Value   Potassium 2.6 (*)    Chloride 97 (*)    Glucose, Bld 127 (*)    BUN <5 (*)    Creatinine, Ser <0.30 (*)    Calcium 8.2 (*)    All other components within normal limits  CBC - Abnormal; Notable for the following components:   WBC 14.4 (*)    RDW 16.7 (*)    All other components within normal limits  MAGNESIUM - Abnormal; Notable for the following components:   Magnesium 1.4 (*)    All other components within normal limits  URINALYSIS, ROUTINE W REFLEX MICROSCOPIC - Abnormal; Notable for the following components:   Hgb urine dipstick MODERATE (*)    Bacteria, UA RARE (*)    All other components within normal limits  ETHANOL - Abnormal; Notable for the following components:   Alcohol, Ethyl (B) 112 (*)    All other components within normal limits  HEPATIC FUNCTION PANEL - Abnormal; Notable for the following components:   Albumin 3.2 (*)    Bilirubin, Direct 0.3 (*)    All other components within normal limits  GASTROINTESTINAL PANEL BY PCR, STOOL (REPLACES STOOL CULTURE)  C DIFFICILE QUICK SCREEN W PCR REFLEX  BRAIN NATRIURETIC PEPTIDE  LIPASE, BLOOD  I-STAT TROPONIN, ED    EKG EKG Interpretation  Date/Time:  Sunday August 28 2017 16:10:96 EDT Ventricular Rate:  99 PR Interval:    QRS Duration: 89 QT Interval:  365 QTC Calculation: 469 R Axis:   76 Text Interpretation:  Sinus rhythm Borderline T abnormalities, anterior leads Confirmed by Zadie Rhine ([id number]) on 08/28/2017 6:22:31 AM Also confirmed by Zadie Rhine ([id number]), editor Lodema Hong, Tamera Punt [phone])  on 08/28/2017 8:59:16 AM  Radiology Dg Chest 2 View  Result Date: 08/28/2017 CLINICAL DATA:  Back pain. EXAM: CHEST - 2 VIEW COMPARISON:  08/21/2017 FINDINGS: Lungs are adequately inflated without consolidation or effusion. Cardiomediastinal silhouette and remainder of the exam is unchanged.  IMPRESSION: No acute disease. Electronically Signed   By: Elberta Fortis M.D.   On: 08/28/2017 07:27    Procedures Procedures (including critical care time) CRITICAL CARE Performed by: Jacinto Halim   Total critical care time: 60 minutes - hypoxia & hypokalemia requiring IV and PO replacement.  Critical care time was exclusive of separately billable procedures and treating other patients.  Critical care was necessary to treat or prevent imminent or life-threatening deterioration.  Critical care was time spent personally by me on the following activities: development of treatment plan with patient and/or surrogate as well as nursing, discussions with consultants, evaluation of patient's response to treatment, examination of patient, obtaining history from patient or surrogate, ordering and performing treatments and interventions, ordering and review of laboratory studies, ordering and review of radiographic studies, pulse oximetry and re-evaluation of patient's condition.  Medications Ordered in ED Medications  potassium chloride 10 mEq in 100 mL IVPB (0 mEq Intravenous Stopped 08/28/17 1042)  iopamidol (ISOVUE-370) 76 % injection (has no administration in time range)  iopamidol (ISOVUE-370) 76 % injection (has no administration in time range)  potassium chloride SA (K-DUR,KLOR-CON) CR tablet 40 mEq (40 mEq Oral Given 08/28/17 0639)  sodium chloride 0.9 % bolus 500 mL (0 mLs Intravenous Stopped 08/28/17 0747)  magnesium sulfate IVPB 2 g 50 mL (0 g Intravenous Stopped 08/28/17 0952)  LORazepam (ATIVAN) injection 1 mg (1 mg Intravenous Given 08/28/17 0805)  sodium chloride 0.9 % bolus 500 mL (500 mLs Intravenous New Bag/Given 08/28/17 0952)  fentaNYL (SUBLIMAZE) injection 50 mcg (50 mcg Intravenous Given 08/28/17 1041)  ondansetron (ZOFRAN) injection 4 mg (4 mg Intravenous Given 08/28/17 1040)  iopamidol (ISOVUE-370) 76 % injection 100 mL (100 mLs Intravenous Contrast Given 08/28/17 1054)      Initial Impression / Assessment and Plan / ED Course  I have reviewed the triage vital signs and the nursing notes.  Pertinent labs & imaging results that were available during my care of the patient were reviewed by me and considered in my medical decision making (see chart for details).     57 y.o. female with a past medical history of atrial flutter, bipolar disorder, cirrhosis, COPD, multiple sclerosis, alcohol abuse who presents emergency department today for multiple complaints.  Per chart review patient was recently admitted on 7/14 due to a elevated troponin as well as hypo-kalemia.  During admission patient's troponins were cycled and were normal.  She had echocardiogram that was reassuring and showed an EF of 55-60% with normal LV systolic function, mild diastolic dysfunction, trace MR and TR.  She reports at that time she had loose watery diarrhea x1 week but stool cultures are to be obtained.  She was discharged home in good condition.  Patient reports that she developed severe nausea around 3 AM this morning.  She also reports she has been having increasing shortness of breath that is worsened over the last 2-3 days.  She was transferred to EMS where she received albuterol without any relief.  She reports she is been using her Lasix and albuterol inhalers at home without any relief.  She has no associated fever, cough, hemoptysis, chest pain or lower leg swelling.  She does not wear oxygen at home.  On arrival patient's oxygen  saturations were 89% on room air.  She was placed on 3 L O2 with improvement.  Patient also reports a chronic nonhealing wound on her abdomen. No associated pain.  Patient reports ongoing loose, watery stools with 2 yesterday.  No abdominal pain.  She reports associated nausea without emesis.  No fevers at home.  She notes some urinary frequency as well as dysuria.  No flank pain.  Patient reports some lower back pain.  No bowel/bladder incontinence, urinary  retention, numbness/tingling/weakness of lower extremities.  She has a normal neurologic exam.  No concern for cauda equina.  No midline tenderness, falls recently, trauma or history of cancer.  On presentation the patient is noted to be without fever.  She has mild tachycardia with noted hypoxia as previously mentioned on arrival.  She is without hypotension. On exam patient has decreased breath sounds throughout.  There is no wheezing.  No rhonchi or rales.  She does have some cyanosis of the lips.  Abdomen is soft and nontender.  It is mildly distended.  There is a chronic healing wound on the abdomen as pictured above.   Some labs resulted prior to my arrival and seeing the patient.  Patient is noted to have a leukocytosis of 14.4.  No anemia.  No acute kidney injury.  No anion gap acidosis.  Patient is noted to have severe hypokalemia of 2.6.  This is likely secondary to patient's alcohol abuse, Lasix use, diarrhea and albuterol use at home.  Oral and IV potassium ordered.  Magnesium ordered.  EKG ordered.  Chest x-ray was without any active cardiopulmonary disease.  Will add on lipase, alcohol level, LFTs, BNP, UA, troponin.  This patient is without wheezing at this time will hold on albuterol secondary to hypokalemia.  I have concern for PE given patient's recent immobilization during hospitalization & tachycardia with new hypoxia requiring oxygen without known cause.  Will order Chest CTA to evaluate.  Given patient's ongoing diarrhea I have concern for colitis.  Stool cultures, C. difficile cultures and CT of the abdomen ordered. This will also evaluate lumbar spine 2/2 patients back pain. CIWA screening placed given patient's history of chronic drinking. Will give ativan as needed per CIWA score.   EKG with borderline T wave abnormalities in the anterior leads.  She has borderline tachycardia.  QT/QTc within normal limits.  No evidence of STEMI. Magnesium low. This was replaced.   UA without UTI.   Tn within normal limits.  Lipase within normal limits.  Albumin chronically low.  No LFT elevation. Alcohol elevated at 112. BNP wnl and no pulmonary edema on CXR.  During Ct scan patient pulled out IV prior to push of contrast for CTA PE study and CT of the abdomen. Did have non contrast CT of chest partially but CT was going to repeat with 2nd line to give contrast and patient refused to go through scanner. Patient stating she wants to go home. We discussed the nature and purpose, risks and benefits, as well as, the alternatives of treatment. Time was given to allow the opportunity to ask questions and consider their options, and after the discussion, the patient decided to refuse the offerred treatment. The patient was informed that refusal could lead to, but was not limited to, death, permanent disability, or severe pain. I repeated this 3 times and patient repeated this back to me in understanding. I discussed with her that I do not think she is safe to go home today and will need  to be admitted to the hospital. She is adamant about leaving and says "I will come back another day when I have a better attitude" No family members present. Prior to refusing, I determined that the patient had the capacity to make their decision and understood the consequences of that decision. After refusal, I made every reasonable opportunity to treat them to the best of my ability.  The patient was notified that they may return to the emergency department at any time for further treatment. I recommended that she return as soon as possible.   Final Clinical Impressions(s) / ED Diagnoses   Final diagnoses:  Shortness of breath  Hypoxia  History of COPD  History of alcohol abuse  Hypokalemia  Diarrhea, unspecified type  Left against medical advice    ED Discharge Orders    None       Princella Pellegrini 08/28/17 1237    Gwyneth Sprout, MD 08/29/17 2051

## 2017-08-28 NOTE — ED Notes (Signed)
CRITICAL VALUE STICKER  CRITICAL VALUE: K 2.6  RECEIVER (on-site recipient of call): Jake T  DATE & TIME NOTIFIED: 7/21 558  MESSENGER (representative from lab): Alona Bene  MD NOTIFIED: Bebe Shaggy  TIME OF NOTIFICATION: 559  RESPONSE: see orders

## 2017-08-28 NOTE — Discharge Instructions (Signed)
YOU HAVE CHOOSEN TO LEAVE AGAINST MEDICAL ADVISE TODAY. I RECOMMENDED THAT YOU STAY FOR A CT SCAN OF YOUR CHEST AND ABDOMEN. I AM WORRIED YOU MAY HAVE A BLOOD CLOT IN YOUR LUNG. WE CANNOT RULE OUT EMERGENT DIAGNOSIS THAT COULD BE LIFE THREATENING WHEN YOU LEAVE AGAINST MEDICAL ADVISE. LEAVING AGAINST MEDICAL ADVISE CAN LEAD TO BUT IS NOT LIMITED TO DEATH PERMANENT DISABILITY, WORSENING PAIN. YOU CAN RETURN AT ANYTIME AND I RECOMMEND YOU RETURN AS SOON AS POSSIBLE FOR FURTHER EVALUATION.

## 2017-08-28 NOTE — ED Notes (Signed)
Bed: ZO10 Expected date:  Expected time:  Means of arrival:  Comments: 63F sore on abdomen and COPD

## 2017-08-28 NOTE — Progress Notes (Signed)
Attempted CT scan. Patient refused.

## 2017-08-28 NOTE — ED Notes (Signed)
Patient refusing vital sign at this time.

## 2017-08-28 NOTE — ED Notes (Signed)
Patient refused to sign her AMA paper.

## 2017-08-28 NOTE — ED Notes (Signed)
Patient transported to CT 

## 2017-08-31 ENCOUNTER — Encounter (HOSPITAL_COMMUNITY): Payer: Self-pay | Admitting: Emergency Medicine

## 2017-08-31 ENCOUNTER — Other Ambulatory Visit: Payer: Self-pay

## 2017-08-31 ENCOUNTER — Emergency Department (HOSPITAL_COMMUNITY)
Admission: EM | Admit: 2017-08-31 | Discharge: 2017-08-31 | Disposition: A | Discharge status: Home / Self Care | Payer: Medicare Other | Attending: Emergency Medicine | Admitting: Emergency Medicine

## 2017-08-31 ENCOUNTER — Inpatient Hospital Stay: Payer: Self-pay | Admitting: Family Medicine

## 2017-08-31 DIAGNOSIS — Z79899 Other long term (current) drug therapy: Secondary | ICD-10-CM | POA: Diagnosis not present

## 2017-08-31 DIAGNOSIS — Z760 Encounter for issue of repeat prescription: Secondary | ICD-10-CM | POA: Diagnosis not present

## 2017-08-31 DIAGNOSIS — J449 Chronic obstructive pulmonary disease, unspecified: Secondary | ICD-10-CM | POA: Insufficient documentation

## 2017-08-31 DIAGNOSIS — F17210 Nicotine dependence, cigarettes, uncomplicated: Secondary | ICD-10-CM | POA: Diagnosis not present

## 2017-08-31 DIAGNOSIS — R0781 Pleurodynia: Secondary | ICD-10-CM | POA: Insufficient documentation

## 2017-08-31 MED ORDER — OXYCODONE-ACETAMINOPHEN 5-325 MG PO TABS
1.0000 | ORAL_TABLET | Freq: Once | ORAL | Status: AC
Start: 2017-08-31 — End: 2017-08-31
  Administered 2017-08-31: 1 via ORAL
  Filled 2017-08-31: qty 1

## 2017-08-31 MED ORDER — GABAPENTIN 100 MG PO CAPS: 200.0000 mg | ORAL_CAPSULE | Freq: Three times a day (TID) | ORAL | 3 refills | Status: DC

## 2017-08-31 NOTE — ED Notes (Signed)
Bed: WA09 Expected date:  Expected time:  Means of arrival:  Comments: 57 yr old fall

## 2017-08-31 NOTE — ED Provider Notes (Signed)
[facility]-EMERGENCY DEPT Provider Note   CSN: [id number] Arrival date & time: 08/31/17  2021     History   Chief Complaint Chief Complaint  Patient presents with  . Fall    HPI Anne Black is a 57 y.o. female.  Patient is a 57 year old female with a history of bipolar disorder, chronic alcohol abuse, COPD, cirrhosis, MS who presents with left rib pain.  She states that she fell a few weeks ago and she states that she fractured her ribs on her left side.  She states it was doing better but over the last 2 to 3 days she has had worsening pain to her left ribs.  She states it hurts when she moves and hurts when she breathes.  She has chronic shortness of breath related to her COPD but states her shortness of breath is at baseline.  She denies any leg pain or swelling.  No chest pain other than the rib pain.  No nausea or vomiting.  She is fairly adamant that she does not want to be evaluated.  She states that she called EMS because her ribs were hurting but now she does not feel like we can do anything and she just wants to go home.  She states that her last alcohol use was last night.  She feels a little bit shaky but does not feel like she is going into withdrawals yet.  She does say that she has chronic depression but she states that she does not actually have bipolar disorder.  She denies any worsening of her depression.  She denies any suicidal ideations.  She denies any hallucinations.  No homicidal ideations.     Past Medical History:  Diagnosis Date  . ADHD (attention deficit hyperactivity disorder)   . Alcoholism (HCC)   . Anxiety   . Arthritis   . Ascites   . Bipolar disorder (HCC)   . Cirrhosis (HCC)   . COPD (chronic obstructive pulmonary disease) (HCC)   . Hypokalemia   . Migraine   . Multiple sclerosis (HCC)   . Narcolepsy   . Osteoporosis     Patient Active Problem List   Diagnosis Date Noted  . Weakness 08/21/2017  . COPD exacerbation  (HCC) 06/12/2017  . Chest pain with moderate risk for cardiac etiology 05/30/2017  . Abnormal LFTs 05/30/2017  . Tachycardia 05/30/2017  . COPD (chronic obstructive pulmonary disease) (HCC) 04/19/2017  . Right rib fracture 04/15/2017  . Abdominal wall cellulitis 12/30/2016  . S/P exploratory laparotomy 12/30/2016  . Distal radius fracture, left 09/09/2016  . Thrombocytopenia (HCC) 09/09/2016  . Chest x-ray abnormality   . Umbilical hernia without obstruction and without gangrene 05/12/2016  . Itching 02/24/2016  . Ventral hernia without obstruction or gangrene 02/24/2016  . Palliative care encounter   . Ascites   . Tachypnea   . Atrial flutter with rapid ventricular response (HCC)   . Hypomagnesemia   . Hypoxia   . Dehydration 01/22/2016  . Low back ache 12/30/2015  . Non-intractable vomiting with nausea   . Alcohol abuse with alcohol-induced mood disorder (HCC) 12/15/2015  . Acute alcoholism (HCC)   . Abdominal distension 11/04/2015  . Alcohol abuse   . Dyspnea   . Acute respiratory failure (HCC) 10/15/2015  . Ascites due to alcoholic cirrhosis (HCC) 10/15/2015  . Osteoporosis   . Bipolar I disorder (HCC)   . Multiple sclerosis (HCC)   . Alcoholic cirrhosis of liver with ascites (HCC) 08/22/2015  .  Bipolar disorder, current episode mixed, moderate (HCC) 08/01/2015  . Alcohol use disorder, severe, dependence (HCC) 07/31/2015  . Macrocytic anemia- due to alcohol abuse with normal B12 & folate levels 05/31/2015  . Severe protein-calorie malnutrition (HCC) 05/31/2015  . Hypokalemia 05/26/2015  . Encephalopathy, hepatic (HCC) 05/26/2015  . Alcohol withdrawal (HCC) 05/18/2015  . Abdominal pain 05/18/2015  . Anxiety 05/18/2015  . UTI (lower urinary tract infection) 05/18/2015  . Stimulant abuse (HCC) 12/27/2014  . Nicotine abuse 12/27/2014  . Hyperprolactinemia (HCC) 11/15/2014  . Dyslipidemia   . Tobacco abuse 01/23/2014  . Noncompliance with therapeutic plan 04/04/2013    . Hereditary and idiopathic peripheral neuropathy 05/01/2012  . GERD (gastroesophageal reflux disease) 05/01/2012  . Osteoarthrosis, unspecified whether generalized or localized, involving lower leg 05/01/2012  . Hyponatremia 07/01/2011    Past Surgical History:  Procedure Laterality Date  . ABDOMINAL WALL DEFECT REPAIR N/A 12/30/2016   Procedure: EXPLORATORY LAPAROTOMY WITH REPAIR ABDOMINAL WALL VENTRAL HERNIA;  Surgeon: [name], MD;  Location: [facility] OR;  Service: General;  Laterality: N/A;  . APPLICATION OF WOUND VAC N/A 12/30/2016   Procedure: APPLICATION OF WOUND VAC;  Surgeon: [name], MD;  Location: [facility] OR;  Service: General;  Laterality: N/A;  . CESAREAN SECTION  [phone]  . FRACTURE SURGERY    . HERNIA REPAIR    . MYRINGOTOMY WITH TUBE PLACEMENT Bilateral   . ORIF WRIST FRACTURE Left 09/09/2016   Procedure: OPEN REDUCTION INTERNAL FIXATION (ORIF) LEFT WRIST FRACTURE, LEFT CARPAL TUNNEL RELEASE;  Surgeon: Dominica Severin, MD;  Location: [facility] ORS;  Service: Orthopedics;  Laterality: Left;  . TONSILLECTOMY       OB History   None      Home Medications    Prior to Admission medications   Medication Sig Start Date End Date Taking? Authorizing Provider  albuterol (PROVENTIL HFA;VENTOLIN HFA) 108 (90 Base) MCG/ACT inhaler Inhale 2 puffs into the lungs every 6 (six) hours as needed for wheezing or shortness of breath.    [provider]  bismuth subsalicylate (PEPTO-BISMOL) 262 MG chewable tablet Chew 2 tablets (524 mg total) by mouth as needed for diarrhea or loose stools. 07/08/17   Molpus, John, MD  diltiazem (TIAZAC) 120 MG 24 hr capsule Take 120 mg by mouth daily.    [provider]  FLUoxetine (PROZAC) 20 MG capsule Take 20 mg by mouth daily.  03/30/16   [provider]  fluticasone (FLONASE) 50 MCG/ACT nasal spray Place 1 spray into both nostrils as needed for allergies or rhinitis.    [provider]   Fluticasone-Salmeterol (ADVAIR) 100-50 MCG/DOSE AEPB Inhale 1-2 puffs 2 (two) times daily into the lungs.  11/05/14   [provider]  furosemide (LASIX) 40 MG tablet Take 1 tablet (40 mg total) by mouth daily. 06/17/16   Midge Minium, MD  gabapentin (NEURONTIN) 100 MG capsule Take 2 capsules (200 mg total) by mouth 3 (three) times daily. 08/31/17   Rolan Bucco, MD  hydrocortisone cream 1 % Apply topically 3 (three) times daily. Patient taking differently: Apply 1 application topically 2 (two) times daily.  04/20/17   Sheikh, Omair Latif, DO  modafinil (PROVIGIL) 200 MG tablet Take 200 mg by mouth 2 (two) times daily.  08/04/16   [provider]  ondansetron (ZOFRAN-ODT) 8 MG disintegrating tablet Take 8 mg by mouth every 8 (eight) hours as needed for nausea or vomiting. Place 8mg  inside cheek    [provider]  oxyCODONE (OXY IR/ROXICODONE) 5 MG immediate  release tablet Take 5 mg by mouth every 4 (four) hours as needed for severe pain.    [provider]  phenazopyridine (PYRIDIUM) 100 MG tablet Take 100 mg by mouth 3 (three) times daily with meals.    [provider]  potassium chloride SA (K-DUR,KLOR-CON) 20 MEQ tablet Take 1 tablet (20 mEq total) by mouth daily for 5 doses. 08/28/17 09/02/17  Maczis, Elmer Sow, PA-C  rifaximin (XIFAXAN) 550 MG TABS tablet Take 550 mg by mouth 2 (two) times daily.    [provider]  risperiDONE (RISPERDAL) 0.5 MG tablet Take 1 tablet (0.5 mg total) by mouth 3 (three) times daily as needed (agitation). Patient taking differently: Take 0.5 mg by mouth every 8 (eight) hours as needed (use for delirium).  02/06/16   Renne Musca, MD  rizatriptan (MAXALT) 10 MG tablet Take 10mg  by mouth every 2 hours as needed for migraine.  May repeat in 2 hours and max 24 hour dose is 30mg . 08/23/17   Gherghe, Daylene Katayama, MD  spironolactone (ALDACTONE) 100 MG tablet Take 1 tablet (100 mg total) by mouth daily. 11/23/16   Derwood Kaplan, MD    Family History Family History  Problem Relation Age of Onset  . Arrhythmia Mother   . Heart disease Father   . Hypertension Father     Social History Social History   Tobacco Use  . Smoking status: Current Every Day Smoker    Packs/day: 1.00    Years: 35.00    Pack years: 35.00    Types: Cigarettes  . Smokeless tobacco: Never Used  Substance Use Topics  . Alcohol use: Not Currently  . Drug use: No     Allergies   Patient has no known allergies.   Review of Systems Review of Systems  Constitutional: Negative for chills, diaphoresis, fatigue and fever.  HENT: Negative for congestion, rhinorrhea and sneezing.   Eyes: Negative.   Respiratory: Positive for shortness of breath. Negative for cough and chest tightness.   Cardiovascular: Positive for chest pain (Rib pain). Negative for leg swelling.  Gastrointestinal: Negative for abdominal pain, blood in stool, diarrhea, nausea and vomiting.  Genitourinary: Negative for difficulty urinating, flank pain, frequency and hematuria.  Musculoskeletal: Negative for arthralgias and back pain.  Skin: Negative for rash.  Neurological: Negative for dizziness, speech difficulty, weakness, numbness and headaches.  Psychiatric/Behavioral: Positive for dysphoric mood.     Physical Exam Updated Vital Signs BP 116/65 (BP Location: Left Arm)   Pulse (!) 106   Temp 98 F (36.7 C) (Oral)   Resp (!) 22   SpO2 98%   Physical Exam  Constitutional: She is oriented to person, place, and time. She appears well-developed and well-nourished.  HENT:  Head: Normocephalic and atraumatic.  Eyes: Pupils are equal, round, and reactive to light.  Neck: Normal range of motion. Neck supple.  Cardiovascular: Normal rate, regular rhythm and normal heart sounds.  Pulmonary/Chest: Effort normal and breath sounds normal. No respiratory distress. She has no wheezes. She has no rales. She exhibits tenderness (Positive tenderness to the left  mid ribs in the lateral and posterior area.  There is no overlying rashes or wounds.  No crepitus or deformity.).  Abdominal: Soft. Bowel sounds are normal. There is no tenderness. There is no rebound and no guarding.  Patient has a chronic wound to her mid abdomen which she states is from her prior surgery.  There is no drainage or signs of infection.  Musculoskeletal: Normal range  of motion. She exhibits no edema.  Lymphadenopathy:    She has no cervical adenopathy.  Neurological: She is alert and oriented to person, place, and time.  She is able to ambulate without ataxia  Skin: Skin is warm and dry. No rash noted.  Psychiatric: She has a normal mood and affect.     ED Treatments / Results  Labs (all labs ordered are listed, but only abnormal results are displayed) Labs Reviewed - No data to display  EKG None  Radiology No results found.  Procedures Procedures (including critical care time)  Medications Ordered in ED Medications  oxyCODONE-acetaminophen (PERCOCET/ROXICET) 5-325 MG per tablet 1 tablet (has no administration in time range)     Initial Impression / Assessment and Plan / ED Course  I have reviewed the triage vital signs and the nursing notes.  Pertinent labs & imaging results that were available during my care of the patient were reviewed by me and considered in my medical decision making (see chart for details).     Patient is very adamant about not wanting to be evaluated.  She does not want any imaging studies.  She does not want any further treatment.  She did want something for pain.  I advised her I cannot prescribe her anything for pain but I can give her 1 dose of pain medication in the ED.  She was given 1 Percocet.  She states that she lost her Neurontin prescription and is requesting a refill.  I gave her a short-term prescription for Neurontin and encouraged her to follow-up with her PCP.  She is alert and oriented x4.  She is able to ambulate  without assistance and without ataxia.  She has mild tachycardia but no overt signs of withdrawal.  She is calm and cooperative.  She was advised the risk of refusal of treatment and is leaving without further evaluation.  Final Clinical Impressions(s) / ED Diagnoses   Final diagnoses:  Rib pain on left side    ED Discharge Orders        Ordered    gabapentin (NEURONTIN) 100 MG capsule  3 times daily     08/31/17 2111       Rolan Bucco, MD 08/31/17 2112

## 2017-08-31 NOTE — ED Triage Notes (Signed)
EMS to pt home x2 today for back pain. Pt wants seen for mid left back pain post fall x30 days. Ago also has abd  Wound post- op x30 days

## 2017-09-01 ENCOUNTER — Other Ambulatory Visit: Payer: Self-pay

## 2017-09-01 ENCOUNTER — Emergency Department (HOSPITAL_COMMUNITY)
Admission: EM | Admit: 2017-09-01 | Discharge: 2017-09-01 | Disposition: A | Discharge status: Home / Self Care | Payer: No Typology Code available for payment source | Attending: Emergency Medicine | Admitting: Emergency Medicine

## 2017-09-01 ENCOUNTER — Ambulatory Visit: Payer: Self-pay

## 2017-09-01 ENCOUNTER — Encounter: Payer: Self-pay | Admitting: Family Medicine

## 2017-09-01 DIAGNOSIS — F10920 Alcohol use, unspecified with intoxication, uncomplicated: Secondary | ICD-10-CM

## 2017-09-01 NOTE — ED Notes (Signed)
Patient decided she wanted to go home.  She asked nurse to call a taxi for her and Dr. Aileen Pilot was at bedside to examine her and agreed she could go home.

## 2017-09-01 NOTE — Telephone Encounter (Signed)
Out going call to patient . Verified patient  and DOB . Verified  Phone number to be reach incase of accidental disconnection. . Attempted to assess patient's complaint of nausea.  Patient states" fuck you " and hung up.

## 2017-09-01 NOTE — ED Notes (Signed)
Bed: TM22 Expected date: 09/01/17 Expected time: 12:54 PM Means of arrival: Ambulance Comments: ETOH needs SW consult

## 2017-09-01 NOTE — ED Provider Notes (Signed)
Medical screening exam.  Patient came to the emergency department and stated she wants something to eat.  Patient does not want to be examined.  She has been drinking alcohol.  Patient is alert oriented x4 ambulating without falling down.  She is not suicidal homicidal or has any visual or auditory hallucinations.  Patient would like to leave AMA.  She is stable to go home   Bethann Berkshire, MD 09/01/17 1321

## 2017-09-01 NOTE — ED Notes (Signed)
Patient left AMA by [facility].  Patient taken by nurse to waiting area.

## 2017-09-01 NOTE — ED Triage Notes (Signed)
Patient from home drinks 1 gallon of wine per day.  EMS said patient is intoxicated now.  Patient is a frequent flyer with 22 calls to EMS in the past month.  Patient hollering and wants to leave.

## 2017-09-02 ENCOUNTER — Ambulatory Visit: Payer: Self-pay | Admitting: Physician Assistant

## 2017-09-03 ENCOUNTER — Emergency Department (HOSPITAL_COMMUNITY)
Admission: EM | Admit: 2017-09-03 | Discharge: 2017-09-03 | Disposition: A | Discharge status: Home / Self Care | Payer: Medicare Other

## 2017-09-03 NOTE — ED Notes (Signed)
Upon entering the room, patient had urinated on self and was demanding a cab voucher. Informed by nursing staff that we did not have cab vouchers/provide them. Patient stated "I got one before." RN,Crystal informed her that it was due to patient having surgery. Patient stated "you're working for my surgery.Patient informed "no, we are not" Patient is very rude and demanding and asked nicely to stop talking to [place] as rudely as she is. Patient was wheeled out to lobby in wheelchair.

## 2017-09-03 NOTE — ED Notes (Addendum)
Pt had voided on her self in bed. Pt screaming out while in room. Upon going into room pt continues to yell. Pt asked to please stop yelling. Pt states " I am hurting and I am not going to stay in this room by myself. You need to stat in here with me." Pt informed that this RN is the only RN in this section and that there are other patients that need care. Pt states " I will keep yelling" Pt informed that that behavior will not be tolerated. Pt then states " Well I am leaving get me out of here. And I want a voucher." This RN inquired about vouchers, No voucher available. Pt demanding to see a Child psychotherapist. Pt informed that social work is not here at this time. Pt states " They gave me a voucher before, when I had surgery. And my surgery is paying for you to work" Pt informed once again that we do not have any vouchers. Non-skid socks placed on pt and pt assisted into a wheelchair and escorted to lobby with access to phone.

## 2017-09-03 NOTE — ED Notes (Signed)
Before triage could be completed. Pt stated I am leaving. I want breakfast and a voucher. I am leaving. Pt demanding voucher and Child psychotherapist. Pt informed that we do not currently have vouchers and social work is not here at this time. Pt very rude to staff and demanding a ride home. Pt taken to lobby in wheelchair and was provided access to phone. Pt calling family for ride. Per EMS: Pt was ambulatory on scene.

## 2017-11-07 ENCOUNTER — Emergency Department (HOSPITAL_COMMUNITY)
Admission: EM | Admit: 2017-11-07 | Discharge: 2017-11-07 | Disposition: A | Discharge status: Home / Self Care | Payer: No Typology Code available for payment source | Attending: Emergency Medicine | Admitting: Emergency Medicine

## 2017-11-07 ENCOUNTER — Encounter (HOSPITAL_COMMUNITY): Payer: Self-pay | Admitting: Family Medicine

## 2017-11-07 DIAGNOSIS — F17210 Nicotine dependence, cigarettes, uncomplicated: Secondary | ICD-10-CM | POA: Insufficient documentation

## 2017-11-07 DIAGNOSIS — Z79899 Other long term (current) drug therapy: Secondary | ICD-10-CM | POA: Diagnosis not present

## 2017-11-07 DIAGNOSIS — R110 Nausea: Secondary | ICD-10-CM | POA: Insufficient documentation

## 2017-11-07 DIAGNOSIS — J449 Chronic obstructive pulmonary disease, unspecified: Secondary | ICD-10-CM | POA: Diagnosis not present

## 2017-11-07 NOTE — ED Provider Notes (Signed)
[facility]-EMERGENCY DEPT Provider Note   CSN: [id number] Arrival date & time: 11/07/17  1811     History   Chief Complaint Chief Complaint  Patient presents with  . Psychiatric Evaluation  . Nausea    HPI Anne Black is a 57 y.o. female.  57 y/o female with no PMH presents to the ED with a chief complaint of SI and HI. When I entered the room to see patient, she reports "I want to go home, I have nausea but want to go home.Patient was brought it to ED via EMS after she had a bowel movement in her neighbor's yard.  Patient denies any suicidal, homicidal ideations at this time.  Patient is refusing care by me.     Past Medical History:  Diagnosis Date  . ADHD (attention deficit hyperactivity disorder)   . Alcoholism (HCC)   . Anxiety   . Arthritis   . Ascites   . Bipolar disorder (HCC)   . Cirrhosis (HCC)   . COPD (chronic obstructive pulmonary disease) (HCC)   . Hypokalemia   . Migraine   . Multiple sclerosis (HCC)   . Narcolepsy   . Osteoporosis     Patient Active Problem List   Diagnosis Date Noted  . Weakness 08/21/2017  . COPD exacerbation (HCC) 06/12/2017  . Chest pain with moderate risk for cardiac etiology 05/30/2017  . Abnormal LFTs 05/30/2017  . Tachycardia 05/30/2017  . COPD (chronic obstructive pulmonary disease) (HCC) 04/19/2017  . Right rib fracture 04/15/2017  . Abdominal wall cellulitis 12/30/2016  . S/P exploratory laparotomy 12/30/2016  . Distal radius fracture, left 09/09/2016  . Thrombocytopenia (HCC) 09/09/2016  . Chest x-ray abnormality   . Umbilical hernia without obstruction and without gangrene 05/12/2016  . Itching 02/24/2016  . Ventral hernia without obstruction or gangrene 02/24/2016  . Palliative care encounter   . Ascites   . Tachypnea   . Atrial flutter with rapid ventricular response (HCC)   . Hypomagnesemia   . Hypoxia   . Dehydration 01/22/2016  . Low back ache 12/30/2015  . Non-intractable  vomiting with nausea   . Alcohol abuse with alcohol-induced mood disorder (HCC) 12/15/2015  . Acute alcoholism (HCC)   . Abdominal distension 11/04/2015  . Alcohol abuse   . Dyspnea   . Acute respiratory failure (HCC) 10/15/2015  . Ascites due to alcoholic cirrhosis (HCC) 10/15/2015  . Osteoporosis   . Bipolar I disorder (HCC)   . Multiple sclerosis (HCC)   . Alcoholic cirrhosis of liver with ascites (HCC) 08/22/2015  . Bipolar disorder, current episode mixed, moderate (HCC) 08/01/2015  . Alcohol use disorder, severe, dependence (HCC) 07/31/2015  . Macrocytic anemia- due to alcohol abuse with normal B12 & folate levels 05/31/2015  . Severe protein-calorie malnutrition (HCC) 05/31/2015  . Hypokalemia 05/26/2015  . Encephalopathy, hepatic (HCC) 05/26/2015  . Alcohol withdrawal (HCC) 05/18/2015  . Abdominal pain 05/18/2015  . Anxiety 05/18/2015  . UTI (lower urinary tract infection) 05/18/2015  . Stimulant abuse (HCC) 12/27/2014  . Nicotine abuse 12/27/2014  . Hyperprolactinemia (HCC) 11/15/2014  . Dyslipidemia   . Tobacco abuse 01/23/2014  . Noncompliance with therapeutic plan 04/04/2013  . Hereditary and idiopathic peripheral neuropathy 05/01/2012  . GERD (gastroesophageal reflux disease) 05/01/2012  . Osteoarthrosis, unspecified whether generalized or localized, involving lower leg 05/01/2012  . Hyponatremia 07/01/2011    Past Surgical History:  Procedure Laterality Date  . ABDOMINAL WALL DEFECT REPAIR N/A 12/30/2016   Procedure: EXPLORATORY LAPAROTOMY WITH REPAIR  ABDOMINAL WALL VENTRAL HERNIA;  Surgeon: [name], MD;  Location: [facility] OR;  Service: General;  Laterality: N/A;  . APPLICATION OF WOUND VAC N/A 12/30/2016   Procedure: APPLICATION OF WOUND VAC;  Surgeon: [name], MD;  Location: [facility] OR;  Service: General;  Laterality: N/A;  . CESAREAN SECTION  [phone]  . FRACTURE SURGERY    . HERNIA REPAIR    . MYRINGOTOMY WITH TUBE PLACEMENT Bilateral   . ORIF  WRIST FRACTURE Left 09/09/2016   Procedure: OPEN REDUCTION INTERNAL FIXATION (ORIF) LEFT WRIST FRACTURE, LEFT CARPAL TUNNEL RELEASE;  Surgeon: Dominica Severin, MD;  Location: [facility] ORS;  Service: Orthopedics;  Laterality: Left;  . TONSILLECTOMY       OB History   None      Home Medications    Prior to Admission medications   Medication Sig Start Date End Date Taking? Authorizing Provider  FLUoxetine (PROZAC) 20 MG capsule Take 20 mg by mouth daily.  03/30/16  Yes [provider]  fluticasone (FLONASE) 50 MCG/ACT nasal spray Place 1 spray into both nostrils as needed for allergies or rhinitis.   Yes [provider]  Fluticasone-Salmeterol (ADVAIR) 100-50 MCG/DOSE AEPB Inhale 1-2 puffs 2 (two) times daily into the lungs.  11/05/14  Yes [provider]  furosemide (LASIX) 40 MG tablet Take 1 tablet (40 mg total) by mouth daily. 06/17/16  Yes Midge Minium, MD  gabapentin (NEURONTIN) 100 MG capsule Take 2 capsules (200 mg total) by mouth 3 (three) times daily. 08/31/17  Yes Rolan Bucco, MD  hydrocortisone cream 1 % Apply topically 3 (three) times daily. Patient taking differently: Apply 1 application topically 2 (two) times daily.  04/20/17  Yes Sheikh, Omair Latif, DO  meloxicam (MOBIC) 15 MG tablet Take 15 mg by mouth daily. 10/11/17  Yes [provider]  ondansetron (ZOFRAN-ODT) 8 MG disintegrating tablet Take 8 mg by mouth every 8 (eight) hours as needed for nausea or vomiting. Place 8mg  inside cheek   Yes [provider]  potassium chloride SA (K-DUR,KLOR-CON) 20 MEQ tablet Take 1 tablet (20 mEq total) by mouth daily for 5 doses. 08/28/17 11/07/17 Yes Maczis, Elmer Sow, PA-C  rizatriptan (MAXALT) 10 MG tablet Take 10mg  by mouth every 2 hours as needed for migraine.  May repeat in 2 hours and max 24 hour dose is 30mg . 08/23/17  Yes Gherghe, Daylene Katayama, MD  spironolactone (ALDACTONE) 25 MG tablet Take 25 mg by mouth daily. 09/14/17  Yes [provider]    traZODone (DESYREL) 50 MG tablet Take 50 mg by mouth daily. 09/27/17  Yes [provider]  triamcinolone cream (KENALOG) 0.1 % Apply 1 application topically daily. 10/12/17  Yes [provider]  albuterol (PROVENTIL HFA;VENTOLIN HFA) 108 (90 Base) MCG/ACT inhaler Inhale 2 puffs into the lungs every 6 (six) hours as needed for wheezing or shortness of breath.    [provider]  bismuth subsalicylate (PEPTO-BISMOL) 262 MG chewable tablet Chew 2 tablets (524 mg total) by mouth as needed for diarrhea or loose stools. Patient not taking: Reported on 11/07/2017 07/08/17   Molpus, John, MD  modafinil (PROVIGIL) 200 MG tablet Take 200 mg by mouth 2 (two) times daily.  08/04/16   [provider]  risperiDONE (RISPERDAL) 0.5 MG tablet Take 1 tablet (0.5 mg total) by mouth 3 (three) times daily as needed (agitation). Patient taking differently: Take 0.5 mg by mouth every 8 (eight) hours as needed (use for delirium).  02/06/16   Renne Musca, MD  Family History Family History  Problem Relation Age of Onset  . Arrhythmia Mother   . Heart disease Father   . Hypertension Father     Social History Social History   Tobacco Use  . Smoking status: Current Every Day Smoker    Packs/day: 1.00    Years: 35.00    Pack years: 35.00    Types: Cigarettes  . Smokeless tobacco: Never Used  Substance Use Topics  . Alcohol use: Not Currently  . Drug use: No     Allergies   Patient has no known allergies.   Review of Systems Review of Systems  Constitutional: Negative for chills and fever.  HENT: Negative for rhinorrhea and sore throat.   Respiratory: Negative for chest tightness and shortness of breath.   Cardiovascular: Negative for chest pain and palpitations.  Gastrointestinal: Positive for nausea. Negative for abdominal pain, constipation and vomiting.  Genitourinary: Negative for dysuria and flank pain.  Musculoskeletal: Negative for back pain.   Psychiatric/Behavioral: Negative for confusion, hallucinations and suicidal ideas.  All other systems reviewed and are negative.    Physical Exam Updated Vital Signs BP 106/60   Pulse 84   Temp 98 F (36.7 C) (Oral)   Resp 20   SpO2 90%   Physical Exam  Constitutional: She is oriented to person, place, and time. She appears well-developed and well-nourished.  HENT:  Head: Normocephalic and atraumatic.  Cardiovascular: Normal heart sounds.  Pulmonary/Chest: Breath sounds normal.  Abdominal: Bowel sounds are normal.  Neurological: She is alert and oriented to person, place, and time.  Skin: Skin is warm and dry.  Nursing note and vitals reviewed.    ED Treatments / Results  Labs (all labs ordered are listed, but only abnormal results are displayed) Labs Reviewed - No data to display  EKG None  Radiology No results found.  Procedures Procedures (including critical care time)  Medications Ordered in ED Medications - No data to display   Initial Impression / Assessment and Plan / ED Course  I have reviewed the triage vital signs and the nursing notes.  Pertinent labs & imaging results that were available during my care of the patient were reviewed by me and considered in my medical decision making (see chart for details).     Presents after a bowel movement on her neighbors ER.  The first encounter I have with patient was entering in the room and patient ran to the bathroom and states ' he give me a second welling of the bathroom ".  The next encounter had with patient is that she states she would like to go home but she is nauseated but would like to go home because she does not want to be here.  Patient refused any medical care on my part.  Discussed with patient the risks of leaving AGAINST MEDICAL ADVICE.  Patient understands she is currently denying any suicidal or homicidal ideations.  She is not a threat to herself or others will outpatient to go home.    Final Clinical Impressions(s) / ED Diagnoses   Final diagnoses:  Nausea    ED Discharge Orders    None       Claude Manges, Cordelia Poche 11/07/17 2022    Tegeler, Canary Brim, MD 11/07/17 [phone]

## 2017-11-07 NOTE — ED Notes (Signed)
Patient was going to be placed in the lobby since she is not suicidal or homicidal. However, she refused to get into the wheelchair. Patient was placed in TCU.

## 2017-11-07 NOTE — ED Provider Notes (Addendum)
6:55 PM Walked into room to evaluate patient, she states she's going to the bathroom at this time. Will attempt to evaluate patient after bathroom.    7:25 PM patient denies any suicidal, homicidal ideations, at this time she states she does not want to be seen by a provider and would like to go home.  I have informed this to the nurse in behavioral unit who will contact the charge nurse and further follow-up with the officer on shift.  Claude Manges, PA-C 11/07/17 1855    Claude Manges, PA-C 11/07/17 1926    Tegeler, Canary Brim, MD 11/07/17 2312

## 2017-11-07 NOTE — ED Notes (Signed)
Pt states she is unsure why she is here. Pt denies needs or concerns.

## 2017-11-07 NOTE — ED Triage Notes (Signed)
Patient is from home and transported via [facility]. According to EMS, she called for emergency services earlier today. She got in the back of the ambulance until they were at the end of her drive way. She got out of the ambulance, took a bowel movement in her neighbors yard. Later, she called EMS again. She complained of nausea from ETOH withdrawal. Deputy threatened to arrest her for misuse of 911. She was given a choice to either come to the hospital or be arrested since is commonly refuses care.

## 2017-11-17 ENCOUNTER — Other Ambulatory Visit: Payer: Self-pay

## 2017-11-17 ENCOUNTER — Emergency Department (HOSPITAL_COMMUNITY)
Admission: EM | Admit: 2017-11-17 | Discharge: 2017-11-18 | Disposition: A | Discharge status: Home / Self Care | Payer: No Typology Code available for payment source | Attending: Emergency Medicine | Admitting: Emergency Medicine

## 2017-11-17 ENCOUNTER — Encounter (HOSPITAL_COMMUNITY): Payer: Self-pay | Admitting: *Deleted

## 2017-11-17 DIAGNOSIS — Y906 Blood alcohol level of 120-199 mg/100 ml: Secondary | ICD-10-CM | POA: Insufficient documentation

## 2017-11-17 DIAGNOSIS — Z046 Encounter for general psychiatric examination, requested by authority: Secondary | ICD-10-CM | POA: Diagnosis present

## 2017-11-17 DIAGNOSIS — K7030 Alcoholic cirrhosis of liver without ascites: Secondary | ICD-10-CM | POA: Insufficient documentation

## 2017-11-17 DIAGNOSIS — G35 Multiple sclerosis: Secondary | ICD-10-CM | POA: Insufficient documentation

## 2017-11-17 DIAGNOSIS — F17210 Nicotine dependence, cigarettes, uncomplicated: Secondary | ICD-10-CM | POA: Insufficient documentation

## 2017-11-17 DIAGNOSIS — F909 Attention-deficit hyperactivity disorder, unspecified type: Secondary | ICD-10-CM | POA: Diagnosis not present

## 2017-11-17 DIAGNOSIS — J449 Chronic obstructive pulmonary disease, unspecified: Secondary | ICD-10-CM | POA: Diagnosis not present

## 2017-11-17 DIAGNOSIS — Z79899 Other long term (current) drug therapy: Secondary | ICD-10-CM | POA: Insufficient documentation

## 2017-11-17 DIAGNOSIS — F1020 Alcohol dependence, uncomplicated: Secondary | ICD-10-CM

## 2017-11-17 LAB — CBC
HCT: 38.4 % (ref 36.0–46.0)
HEMOGLOBIN: 11.6 g/dL — AB (ref 12.0–15.0)
MCH: 27.7 pg (ref 26.0–34.0)
MCHC: 30.2 g/dL (ref 30.0–36.0)
MCV: 91.6 fL (ref 80.0–100.0)
NRBC: 0 % (ref 0.0–0.2)
Platelets: 206 10*3/uL (ref 150–400)
RBC: 4.19 MIL/uL (ref 3.87–5.11)
RDW: 17.8 % — ABNORMAL HIGH (ref 11.5–15.5)
WBC: 7.6 10*3/uL (ref 4.0–10.5)

## 2017-11-17 LAB — COMPREHENSIVE METABOLIC PANEL
ALT: 39 U/L (ref 0–44)
ANION GAP: 11 (ref 5–15)
AST: 68 U/L — ABNORMAL HIGH (ref 15–41)
Albumin: 3.3 g/dL — ABNORMAL LOW (ref 3.5–5.0)
Alkaline Phosphatase: 132 U/L — ABNORMAL HIGH (ref 38–126)
BUN: 9 mg/dL (ref 6–20)
CHLORIDE: 101 mmol/L (ref 98–111)
CO2: 29 mmol/L (ref 22–32)
Calcium: 8.5 mg/dL — ABNORMAL LOW (ref 8.9–10.3)
Creatinine, Ser: 0.5 mg/dL (ref 0.44–1.00)
GFR calc Af Amer: >60 mL/min (ref >60)
GFR calc non Af Amer: >60 mL/min (ref >60)
Glucose, Bld: 107 mg/dL — ABNORMAL HIGH (ref 70–99)
POTASSIUM: 3.8 mmol/L (ref 3.5–5.1)
Sodium: 141 mmol/L (ref 135–145)
Total Bilirubin: 2.3 mg/dL — ABNORMAL HIGH (ref 0.3–1.2)
Total Protein: 6 g/dL — ABNORMAL LOW (ref 6.5–8.1)

## 2017-11-17 LAB — ETHANOL: ALCOHOL ETHYL (B): 180 mg/dL — AB (ref <10)

## 2017-11-17 LAB — I-STAT BETA HCG BLOOD, ED (MC, WL, AP ONLY): I-stat hCG, quantitative: <5 m[IU]/mL (ref <5)

## 2017-11-17 MED ORDER — LORAZEPAM 1 MG PO TABS: 0.0000 mg | ORAL_TABLET | Freq: Two times a day (BID) | ORAL | Status: DC

## 2017-11-17 MED ORDER — LORAZEPAM 2 MG/ML IJ SOLN: 0.0000 mg | Freq: Two times a day (BID) | INTRAMUSCULAR | Status: DC

## 2017-11-17 MED ORDER — LORAZEPAM 2 MG/ML IJ SOLN: 0.0000 mg | Freq: Four times a day (QID) | INTRAMUSCULAR | Status: DC

## 2017-11-17 MED ORDER — LORAZEPAM 1 MG PO TABS
0.0000 mg | ORAL_TABLET | Freq: Four times a day (QID) | ORAL | Status: DC
  Administered 2017-11-17: 1 mg via ORAL
  Administered 2017-11-18: 2 mg via ORAL
  Filled 2017-11-17: qty 1
  Filled 2017-11-17: qty 2

## 2017-11-17 MED ORDER — THIAMINE HCL 100 MG/ML IJ SOLN: 100.0000 mg | Freq: Every day | INTRAMUSCULAR | Status: DC

## 2017-11-17 MED ORDER — VITAMIN B-1 100 MG PO TABS
100.0000 mg | ORAL_TABLET | Freq: Every day | ORAL | Status: DC
  Administered 2017-11-18: 100 mg via ORAL
  Filled 2017-11-17: qty 1

## 2017-11-17 NOTE — ED Provider Notes (Signed)
[facility]-EMERGENCY DEPT Provider Note   CSN: [id number] Arrival date & time: 11/17/17  2134     History   Chief Complaint Chief Complaint  Patient presents with  . IVC    HPI Anne Black is a 57 y.o. female.  The history is provided by the patient and medical records.   57 y.o. F with hx of ADHD, alcoholism, anxiety, arthritis, cirrhosis, COPD, MS, presenting to the ED under IVC Petitioned by her daughter.  Per IVC paperwork patient is an alcoholic, drinks a box of wine per day.  She is also not been caring for herself.  When talking with patient, she does not seem to understand why she is here in the ED.  She thinks her daughter IVC to her because she had diarrhea today and had an accident in her house.  When Sheriff went to her house to serve the IVC papers, her house was covered in vomit, feces, and urine.  Patient herself is wearing pants soaked in urine.  He does not seem to have insight into this.  She is requesting to go home, states she is "fine".  She denies SI/HI/AVH.  Past Medical History:  Diagnosis Date  . ADHD (attention deficit hyperactivity disorder)   . Alcoholism (HCC)   . Anxiety   . Arthritis   . Ascites   . Bipolar disorder (HCC)   . Cirrhosis (HCC)   . COPD (chronic obstructive pulmonary disease) (HCC)   . Hypokalemia   . Migraine   . Multiple sclerosis (HCC)   . Narcolepsy   . Osteoporosis     Patient Active Problem List   Diagnosis Date Noted  . Weakness 08/21/2017  . COPD exacerbation (HCC) 06/12/2017  . Chest pain with moderate risk for cardiac etiology 05/30/2017  . Abnormal LFTs 05/30/2017  . Tachycardia 05/30/2017  . COPD (chronic obstructive pulmonary disease) (HCC) 04/19/2017  . Right rib fracture 04/15/2017  . Abdominal wall cellulitis 12/30/2016  . S/P exploratory laparotomy 12/30/2016  . Distal radius fracture, left 09/09/2016  . Thrombocytopenia (HCC) 09/09/2016  . Chest x-ray abnormality   .  Umbilical hernia without obstruction and without gangrene 05/12/2016  . Itching 02/24/2016  . Ventral hernia without obstruction or gangrene 02/24/2016  . Palliative care encounter   . Ascites   . Tachypnea   . Atrial flutter with rapid ventricular response (HCC)   . Hypomagnesemia   . Hypoxia   . Dehydration 01/22/2016  . Low back ache 12/30/2015  . Non-intractable vomiting with nausea   . Alcohol abuse with alcohol-induced mood disorder (HCC) 12/15/2015  . Acute alcoholism (HCC)   . Abdominal distension 11/04/2015  . Alcohol abuse   . Dyspnea   . Acute respiratory failure (HCC) 10/15/2015  . Ascites due to alcoholic cirrhosis (HCC) 10/15/2015  . Osteoporosis   . Bipolar I disorder (HCC)   . Multiple sclerosis (HCC)   . Alcoholic cirrhosis of liver with ascites (HCC) 08/22/2015  . Bipolar disorder, current episode mixed, moderate (HCC) 08/01/2015  . Alcohol use disorder, severe, dependence (HCC) 07/31/2015  . Macrocytic anemia- due to alcohol abuse with normal B12 & folate levels 05/31/2015  . Severe protein-calorie malnutrition (HCC) 05/31/2015  . Hypokalemia 05/26/2015  . Encephalopathy, hepatic (HCC) 05/26/2015  . Alcohol withdrawal (HCC) 05/18/2015  . Abdominal pain 05/18/2015  . Anxiety 05/18/2015  . UTI (lower urinary tract infection) 05/18/2015  . Stimulant abuse (HCC) 12/27/2014  . Nicotine abuse 12/27/2014  . Hyperprolactinemia (HCC) 11/15/2014  .  Dyslipidemia   . Tobacco abuse 01/23/2014  . Noncompliance with therapeutic plan 04/04/2013  . Hereditary and idiopathic peripheral neuropathy 05/01/2012  . GERD (gastroesophageal reflux disease) 05/01/2012  . Osteoarthrosis, unspecified whether generalized or localized, involving lower leg 05/01/2012  . Hyponatremia 07/01/2011    Past Surgical History:  Procedure Laterality Date  . ABDOMINAL WALL DEFECT REPAIR N/A 12/30/2016   Procedure: EXPLORATORY LAPAROTOMY WITH REPAIR ABDOMINAL WALL VENTRAL HERNIA;  Surgeon:  [name], MD;  Location: [facility] OR;  Service: General;  Laterality: N/A;  . APPLICATION OF WOUND VAC N/A 12/30/2016   Procedure: APPLICATION OF WOUND VAC;  Surgeon: [name], MD;  Location: [facility] OR;  Service: General;  Laterality: N/A;  . CESAREAN SECTION  [phone]  . FRACTURE SURGERY    . HERNIA REPAIR    . MYRINGOTOMY WITH TUBE PLACEMENT Bilateral   . ORIF WRIST FRACTURE Left 09/09/2016   Procedure: OPEN REDUCTION INTERNAL FIXATION (ORIF) LEFT WRIST FRACTURE, LEFT CARPAL TUNNEL RELEASE;  Surgeon: Dominica Severin, MD;  Location: [facility] ORS;  Service: Orthopedics;  Laterality: Left;  . TONSILLECTOMY       OB History   None      Home Medications    Prior to Admission medications   Medication Sig Start Date End Date Taking? Authorizing Provider  albuterol (PROVENTIL HFA;VENTOLIN HFA) 108 (90 Base) MCG/ACT inhaler Inhale 2 puffs into the lungs every 6 (six) hours as needed for wheezing or shortness of breath.    [provider]  bismuth subsalicylate (PEPTO-BISMOL) 262 MG chewable tablet Chew 2 tablets (524 mg total) by mouth as needed for diarrhea or loose stools. Patient not taking: Reported on 11/07/2017 07/08/17   Molpus, John, MD  FLUoxetine (PROZAC) 20 MG capsule Take 20 mg by mouth daily.  03/30/16   [provider]  fluticasone (FLONASE) 50 MCG/ACT nasal spray Place 1 spray into both nostrils as needed for allergies or rhinitis.    [provider]  Fluticasone-Salmeterol (ADVAIR) 100-50 MCG/DOSE AEPB Inhale 1-2 puffs 2 (two) times daily into the lungs.  11/05/14   [provider]  furosemide (LASIX) 40 MG tablet Take 1 tablet (40 mg total) by mouth daily. 06/17/16   Midge Minium, MD  gabapentin (NEURONTIN) 100 MG capsule Take 2 capsules (200 mg total) by mouth 3 (three) times daily. 08/31/17   Rolan Bucco, MD  hydrocortisone cream 1 % Apply topically 3 (three) times daily. Patient taking differently: Apply 1 application topically 2 (two)  times daily.  04/20/17   Marguerita Merles Latif, DO  meloxicam (MOBIC) 15 MG tablet Take 15 mg by mouth daily. 10/11/17   [provider]  modafinil (PROVIGIL) 200 MG tablet Take 200 mg by mouth 2 (two) times daily.  08/04/16   [provider]  ondansetron (ZOFRAN-ODT) 8 MG disintegrating tablet Take 8 mg by mouth every 8 (eight) hours as needed for nausea or vomiting. Place 8mg  inside cheek    [provider]  potassium chloride SA (K-DUR,KLOR-CON) 20 MEQ tablet Take 1 tablet (20 mEq total) by mouth daily for 5 doses. 08/28/17 11/07/17  Maczis, Elmer Sow, PA-C  risperiDONE (RISPERDAL) 0.5 MG tablet Take 1 tablet (0.5 mg total) by mouth 3 (three) times daily as needed (agitation). Patient taking differently: Take 0.5 mg by mouth every 8 (eight) hours as needed (use for delirium).  02/06/16   Renne Musca, MD  rizatriptan (MAXALT) 10 MG tablet Take 10mg  by mouth every 2 hours as needed for migraine.  May repeat in  2 hours and max 24 hour dose is 30mg . 08/23/17   Leatha Gilding, MD  spironolactone (ALDACTONE) 25 MG tablet Take 25 mg by mouth daily. 09/14/17   [provider]  traZODone (DESYREL) 50 MG tablet Take 50 mg by mouth daily. 09/27/17   [provider]  triamcinolone cream (KENALOG) 0.1 % Apply 1 application topically daily. 10/12/17   [provider]    Family History Family History  Problem Relation Age of Onset  . Arrhythmia Mother   . Heart disease Father   . Hypertension Father     Social History Social History   Tobacco Use  . Smoking status: Current Every Day Smoker    Packs/day: 1.00    Years: 35.00    Pack years: 35.00    Types: Cigarettes  . Smokeless tobacco: Never Used  Substance Use Topics  . Alcohol use: Not Currently  . Drug use: No     Allergies   Patient has no known allergies.   Review of Systems Review of Systems  Psychiatric/Behavioral:       IVC  All other systems reviewed and are  negative.    Physical Exam Updated Vital Signs BP 116/68 (BP Location: Left Arm)   Pulse (!) 106   Temp (!) 97.4 F (36.3 C) (Oral)   SpO2 99%   Physical Exam  Constitutional: She is oriented to person, place, and time. She appears well-developed and well-nourished.  Wearing pants soaked in urine, has debris under her nails that resembles feces  HENT:  Head: Normocephalic and atraumatic.  Mouth/Throat: Oropharynx is clear and moist.  Eyes: Pupils are equal, round, and reactive to light. Conjunctivae and EOM are normal.  Neck: Normal range of motion.  Cardiovascular: Normal rate, regular rhythm and normal heart sounds.  Pulmonary/Chest: Effort normal and breath sounds normal. No stridor. No respiratory distress.  Abdominal: Soft. Bowel sounds are normal. There is no tenderness. There is no rebound.  Musculoskeletal: Normal range of motion.  Neurological: She is alert and oriented to person, place, and time.  Skin: Skin is warm and dry.  Psychiatric: She has a normal mood and affect.  Nursing note and vitals reviewed.    ED Treatments / Results  Labs (all labs ordered are listed, but only abnormal results are displayed) Labs Reviewed  COMPREHENSIVE METABOLIC PANEL - Abnormal; Notable for the following components:      Result Value   Glucose, Bld 107 (*)    Calcium 8.5 (*)    Total Protein 6.0 (*)    Albumin 3.3 (*)    AST 68 (*)    Alkaline Phosphatase 132 (*)    Total Bilirubin 2.3 (*)    All other components within normal limits  ETHANOL - Abnormal; Notable for the following components:   Alcohol, Ethyl (B) 180 (*)    All other components within normal limits  CBC - Abnormal; Notable for the following components:   Hemoglobin 11.6 (*)    RDW 17.8 (*)    All other components within normal limits  RAPID URINE DRUG SCREEN, HOSP PERFORMED  I-STAT BETA HCG BLOOD, ED ([facility], [facility], [facility] ONLY)    EKG None  Radiology No results found.  Procedures Procedures (including  critical care time)  Medications Ordered in ED Medications - No data to display   Initial Impression / Assessment and Plan / ED Course  I have reviewed the triage vital signs and the nursing notes.  Pertinent labs & imaging results that were  available during my care of the patient were reviewed by me and considered in my medical decision making (see chart for details).  57 year old female here under IVC petition by her daughter.  When she arrived her house was covered in vomit, feces, and urine.  Patient herself is sitting in urine soaked pants.  He does not seem to have insight into this.  She denies any suicidal homicidal ideation.  No hallucinations.  She is requesting to go home.  Reportedly she drinks a box of wine per day.  Labs overall reassuring, ethanol 180.  Patient medically cleared.  She was taken to TCU and allowed to shower. Placed on CIWA protocol given her history of alcohol abuse.  TTS has evaluated, recommends inpatient placement.  Meds have not been reconciled, have asked pharmacy tech to contact her pharmacy and update list prior to being reordered.  Final Clinical Impressions(s) / ED Diagnoses   Final diagnoses:  Alcoholism [facility])    ED Discharge Orders    None       Garlon Hatchet, PA-C 11/18/17 0622    Wynetta Fines, MD 11/21/17 1440

## 2017-11-17 NOTE — ED Notes (Signed)
TTS assessment in progress. 

## 2017-11-17 NOTE — ED Triage Notes (Signed)
Sheriff officer went to her house to serve IVC papers petitioned by her daughter when they found her door wide open and she was not home.  She was finally found driving back to her house.  Her daughter found feces and urine all over her home.  She refuse to seek medical help.  She drinks a box of wine daily per IVC papers.

## 2017-11-17 NOTE — ED Notes (Signed)
Bed: [facility]29 Expected date:  Expected time:  Means of arrival:  Comments: TR 3 

## 2017-11-17 NOTE — ED Notes (Signed)
Patient wanded by security. 

## 2017-11-17 NOTE — ED Notes (Signed)
Bed: [facility] Expected date:  Expected time:  Means of arrival:  Comments: 

## 2017-11-17 NOTE — BH Assessment (Addendum)
Assessment Note  Anne Black is an 57 y.o. female who presents to the ED under IVC initiated by her daughter. Per IVC, "petitioner went to the respondents home to check on her and found spoiled food left out and feces and urine all over the home. Law enforcement was called and the respondent refused to receive help. Respondent has cirrhosis and COPD and can't control her bowels and refuses to seek medical treatment. She drinks entire boxes of wine on a daily."   TTS asked the pt if she knows why she came to the ED and the pt stated "my daughter did this, she has a pattern of doing this." Pt initially denied that she drinks alcohol although her BAL is 180 on arrival to the ED. Pt later admitted that she sometimes consumes alcohol but claims she has not drank anything since 11/16/17. TTS observes the pt smells of alcohol during the assessment. Pt denies SI, HI, and AVH. Pt states she has pending legal charges due to hitting a mailbox while driving. Pt denies that she was intoxicated while driving.   Pt has a hx of inpt admissions due to alcohol abuse and was recently admitted to a facility in [place]. EDP reports the pt appeared disoriented during her assessment and pt was wearing pants soaked in urine when she initially came to the ED. Pt complains of bottom pain during the TTS assessment.   TTS consulted with Nira Conn, NP who recommends inpt treatment. TTS to seek placement. EDP Garlon Hatchet, PA-C and pt's nurse Consuella Lose, RN have been advised.   Diagnosis: Bipolar Disorder; Substance induced mood disorder; Alcohol use disorder, severe   Past Medical History:  Past Medical History:  Diagnosis Date  . ADHD (attention deficit hyperactivity disorder)   . Alcoholism (HCC)   . Anxiety   . Arthritis   . Ascites   . Bipolar disorder (HCC)   . Cirrhosis (HCC)   . COPD (chronic obstructive pulmonary disease) (HCC)   . Hypokalemia   . Migraine   . Multiple sclerosis (HCC)   . Narcolepsy   .  Osteoporosis     Past Surgical History:  Procedure Laterality Date  . ABDOMINAL WALL DEFECT REPAIR N/A 12/30/2016   Procedure: EXPLORATORY LAPAROTOMY WITH REPAIR ABDOMINAL WALL VENTRAL HERNIA;  Surgeon: [name], MD;  Location: [facility] OR;  Service: General;  Laterality: N/A;  . APPLICATION OF WOUND VAC N/A 12/30/2016   Procedure: APPLICATION OF WOUND VAC;  Surgeon: [name], MD;  Location: [facility] OR;  Service: General;  Laterality: N/A;  . CESAREAN SECTION  [phone]  . FRACTURE SURGERY    . HERNIA REPAIR    . MYRINGOTOMY WITH TUBE PLACEMENT Bilateral   . ORIF WRIST FRACTURE Left 09/09/2016   Procedure: OPEN REDUCTION INTERNAL FIXATION (ORIF) LEFT WRIST FRACTURE, LEFT CARPAL TUNNEL RELEASE;  Surgeon: Dominica Severin, MD;  Location: [facility] ORS;  Service: Orthopedics;  Laterality: Left;  . TONSILLECTOMY      Family History:  Family History  Problem Relation Age of Onset  . Arrhythmia Mother   . Heart disease Father   . Hypertension Father     Social History:  reports that she has been smoking cigarettes. She has a 35.00 pack-year smoking history. She has never used smokeless tobacco. She reports that she drank alcohol. She reports that she does not use drugs.  Additional Social History:  Alcohol / Drug Use Pain Medications: See MAR Prescriptions: See MAR Over the Counter: See MAR History of alcohol /  drug use?: Yes Longest period of sobriety (when/how long): "years" Negative Consequences of Use: Legal Withdrawal Symptoms: Irritability, Nausea / Vomiting, Sweats, Tremors Substance #1 Name of Substance 1: Alcohol 1 - Age of First Use: 17 1 - Amount (size/oz): varies 1 - Frequency: occasional 1 - Duration: ongoing 1 - Last Use / Amount: 11/17/17  CIWA: CIWA-Ar BP: 124/73 Pulse Rate: 96 Nausea and Vomiting: no nausea and no vomiting Tactile Disturbances: none Tremor: not visible, but can be felt fingertip to fingertip Auditory Disturbances: not present Paroxysmal  Sweats: no sweat visible Visual Disturbances: not present Anxiety: moderately anxious, or guarded, so anxiety is inferred Headache, Fullness in Head: none present Agitation: somewhat more than normal activity Orientation and Clouding of Sensorium: oriented and can do serial additions(Pt was able to do math but was unsure about day.  Pt stated "Monday or Tuesday") CIWA-Ar Total: 6 COWS:    Allergies: No Known Allergies  Home Medications:  (Not in a hospital admission)  OB/GYN Status:  No LMP recorded. Patient is postmenopausal.  General Assessment Data Location of Assessment: [facility] ED TTS Assessment: In system Is this a Tele or Face-to-Face Assessment?: Face-to-Face Is this an Initial Assessment or a Re-assessment for this encounter?: Initial Assessment Patient Accompanied by:: (alone) Language Other than English: No What gender do you identify as?: Female Marital status: Divorced Pregnancy Status: No Living Arrangements: Alone Can pt return to current living arrangement?: Yes Admission Status: Involuntary Petitioner: Family member Is patient capable of signing voluntary admission?: No Referral Source: Self/Family/Friend Insurance type: Wellsite geologist Care Plan Living Arrangements: Alone Name of Psychiatrist: None Name of Therapist: None  Education Status Is patient currently in school?: No Is the patient employed, unemployed or receiving disability?: Receiving disability income  Risk to self with the past 6 months Suicidal Ideation: No Has patient been a risk to self within the past 6 months prior to admission? : No Suicidal Intent: No Has patient had any suicidal intent within the past 6 months prior to admission? : No Is patient at risk for suicide?: No Suicidal Plan?: No Has patient had any suicidal plan within the past 6 months prior to admission? : No Access to Means: No What has been your use of drugs/alcohol within the last 12 months?: alcohol Previous  Attempts/Gestures: No Triggers for Past Attempts: None known Intentional Self Injurious Behavior: None Family Suicide History: No Recent stressful life event(s): Legal Issues, Conflict (Comment)(conflict with daughter) Persecutory voices/beliefs?: No Depression: No Substance abuse history and/or treatment for substance abuse?: Yes Suicide prevention information given to non-admitted patients: Not applicable  Risk to Others within the past 6 months Homicidal Ideation: No Does patient have any lifetime risk of violence toward others beyond the six months prior to admission? : No Thoughts of Harm to Others: No Current Homicidal Intent: No Current Homicidal Plan: No Access to Homicidal Means: No History of harm to others?: No Assessment of Violence: None Noted Does patient have access to weapons?: No Criminal Charges Pending?: Yes Describe Pending Criminal Charges: wreckless driving Does patient have a court date: Yes Court Date: (pt does not recall) Is patient on probation?: No  Psychosis Hallucinations: None noted Delusions: None noted  Mental Status Report Appearance/Hygiene: In scrubs Eye Contact: Good Motor Activity: Freedom of movement Speech: Logical/coherent Level of Consciousness: Alert Mood: Anxious Affect: Preoccupied Anxiety Level: Severe Thought Processes: Relevant, Coherent Judgement: Impaired Orientation: Time, Person, Place, Situation, Appropriate for developmental age Obsessive Compulsive Thoughts/Behaviors: None  Cognitive Functioning  Concentration: Normal Memory: Remote Intact, Recent Intact Is patient IDD: No Insight: Poor Impulse Control: Poor Appetite: Good Have you had any weight changes? : No Change Sleep: Increased Total Hours of Sleep: 10 Vegetative Symptoms: Decreased grooming  ADLScreening [facility] Assessment Services) Patient's cognitive ability adequate to safely complete daily activities?: Yes Patient able to express need for assistance  with ADLs?: Yes Independently performs ADLs?: Yes (appropriate for developmental age)  Prior Inpatient Therapy Prior Inpatient Therapy: Yes Prior Therapy Dates: 2019 Prior Therapy Facilty/Provider(s): Facility in [place] Reason for Treatment: Alcohol dependence   Prior Outpatient Therapy Prior Outpatient Therapy: No Does patient have an ACCT team?: No Does patient have Intensive In-House Services?  : No Does patient have [name] services? : No Does patient have P4CC services?: No  ADL Screening (condition at time of admission) Patient's cognitive ability adequate to safely complete daily activities?: Yes Is the patient deaf or have difficulty hearing?: No Does the patient have difficulty seeing, even when wearing glasses/contacts?: No Does the patient have difficulty concentrating, remembering, or making decisions?: No Patient able to express need for assistance with ADLs?: Yes Does the patient have difficulty dressing or bathing?: No Independently performs ADLs?: Yes (appropriate for developmental age) Does the patient have difficulty walking or climbing stairs?: No Weakness of Legs: None Weakness of Arms/Hands: None  Home Assistive Devices/Equipment Home Assistive Devices/Equipment: None    Abuse/Neglect Assessment (Assessment to be complete while patient is alone) Abuse/Neglect Assessment Can Be Completed: Yes Physical Abuse: Denies Verbal Abuse: Denies Sexual Abuse: Denies Exploitation of patient/patient's resources: Denies Self-Neglect: Denies     Merchant navy officer (For Healthcare) Does Patient Have a Medical Advance Directive?: No Would patient like information on creating a medical advance directive?: No - Patient declined          Disposition: TTS consulted with Nira Conn, NP who recommends inpt treatment. TTS to seek placement. EDP Garlon Hatchet, PA-C and pt's nurse Consuella Lose, RN have been advised.  Disposition Initial Assessment Completed for this  Encounter: Yes Disposition of Patient: Admit Type of inpatient treatment program: Adult(per Nira Conn, NP) Patient refused recommended treatment: No  On Site Evaluation by:   Reviewed with Physician:    Karolee Ohs 11/18/2017 2:14 AM

## 2017-11-18 MED ORDER — NICOTINE 21 MG/24HR TD PT24
21.0000 mg | MEDICATED_PATCH | Freq: Every day | TRANSDERMAL | Status: DC
  Administered 2017-11-18: 21 mg via TRANSDERMAL
  Filled 2017-11-18: qty 1

## 2017-11-18 MED ORDER — IBUPROFEN 200 MG PO TABS
400.0000 mg | ORAL_TABLET | Freq: Once | ORAL | Status: AC
  Administered 2017-11-18: 400 mg via ORAL
  Filled 2017-11-18: qty 2

## 2017-11-18 MED ORDER — HYDROCORTISONE 2.5 % RE CREA
TOPICAL_CREAM | Freq: Two times a day (BID) | RECTAL | Status: DC
  Administered 2017-11-18 (×2): via RECTAL
  Filled 2017-11-18: qty 28.35

## 2017-11-18 NOTE — BH Assessment (Signed)
[facility] Assessment Progress Note  Per Juanetta Beets, DO, this pt does not require psychiatric hospitalization at this time.  Pt presents under IVC initiated by pt's daughter, which Dr Sharma Covert has rescinded.  Pt is to be discharged from [facility] with recommendation to continue treatment with her PCP at [facility].  This has been included in pt's discharge instructions.  Pt's nurse, Aram Beecham, has been notified.  Doylene Canning, MA Triage Specialist [phone]

## 2017-11-18 NOTE — ED Notes (Signed)
Pt alert and oriented. Pt cooperative, Pt complains of pain to buttocks. Pt denies any si, hi, and avh.  Pt stable and resting quietly in room.

## 2017-11-18 NOTE — ED Notes (Signed)
Patient reported she wanted to leave and not see peer support.

## 2017-11-18 NOTE — Discharge Instructions (Signed)
For your behavioral health needs, you are advised to continue treatment with your primary care provider:       [facility] at [facility]      [address]      [place], [place] [address]      [phone]

## 2017-11-18 NOTE — BHH Suicide Risk Assessment (Cosign Needed)
Suicide Risk Assessment  Discharge Assessment   [facility] Discharge Suicide Risk Assessment   Principal Problem: <principal problem not specified> Discharge Diagnoses:  Patient Active Problem List   Diagnosis Date Noted  . Weakness [R53.1] 08/21/2017  . COPD exacerbation (HCC) [J44.1] 06/12/2017  . Chest pain with moderate risk for cardiac etiology [R07.9] 05/30/2017  . Abnormal LFTs [R94.5] 05/30/2017  . Tachycardia [R00.0] 05/30/2017  . COPD (chronic obstructive pulmonary disease) (HCC) [J44.9] 04/19/2017  . Right rib fracture [S22.31XA] 04/15/2017  . Abdominal wall cellulitis [L03.311] 12/30/2016  . S/P exploratory laparotomy [Z98.890] 12/30/2016  . Distal radius fracture, left [S52.502A] 09/09/2016  . Thrombocytopenia (HCC) [D69.6] 09/09/2016  . Chest x-ray abnormality [R93.89]   . Umbilical hernia without obstruction and without gangrene [K42.9] 05/12/2016  . Itching [L29.9] 02/24/2016  . Ventral hernia without obstruction or gangrene [K43.9] 02/24/2016  . Palliative care encounter [Z51.5]   . Ascites [R18.8]   . Tachypnea [R06.82]   . Atrial flutter with rapid ventricular response (HCC) [I48.92]   . Hypomagnesemia [E83.42]   . Hypoxia [R09.02]   . Dehydration [E86.0] 01/22/2016  . Low back ache [M54.5] 12/30/2015  . Non-intractable vomiting with nausea [R11.2]   . Alcohol abuse with alcohol-induced mood disorder (HCC) [F10.14] 12/15/2015  . Acute alcoholism (HCC) [F10.20]   . Abdominal distension [R14.0] 11/04/2015  . Alcohol abuse [F10.10]   . Dyspnea [R06.00]   . Acute respiratory failure (HCC) [J96.00] 10/15/2015  . Ascites due to alcoholic cirrhosis (HCC) [K70.31] 10/15/2015  . Osteoporosis [M81.0]   . Bipolar I disorder (HCC) [F31.9]   . Multiple sclerosis (HCC) [G35]   . Alcoholic cirrhosis of liver with ascites (HCC) [K70.31] 08/22/2015  . Bipolar disorder, current episode mixed, moderate (HCC) [F31.62] 08/01/2015  . Alcohol use disorder, severe, dependence (HCC)  [F10.20] 07/31/2015  . Macrocytic anemia- due to alcohol abuse with normal B12 & folate levels [D53.9] 05/31/2015  . Severe protein-calorie malnutrition (HCC) [E43] 05/31/2015  . Hypokalemia [E87.6] 05/26/2015  . Encephalopathy, hepatic (HCC) [K72.90] 05/26/2015  . Alcohol withdrawal (HCC) [F10.239] 05/18/2015  . Abdominal pain [R10.9] 05/18/2015  . Anxiety [F41.9] 05/18/2015  . UTI (lower urinary tract infection) [N39.0] 05/18/2015  . Stimulant abuse (HCC) [F15.10] 12/27/2014  . Nicotine abuse [Z72.0] 12/27/2014  . Hyperprolactinemia (HCC) [E22.1] 11/15/2014  . Dyslipidemia [E78.5]   . Tobacco abuse [Z72.0] 01/23/2014  . Noncompliance with therapeutic plan [Z91.11] 04/04/2013  . Hereditary and idiopathic peripheral neuropathy [G60.9] 05/01/2012  . GERD (gastroesophageal reflux disease) [K21.9] 05/01/2012  . Osteoarthrosis, unspecified whether generalized or localized, involving lower leg [M17.10] 05/01/2012  . Hyponatremia [E87.1] 07/01/2011    Total Time spent with patient: 30 minutes  Musculoskeletal: Strength & Muscle Tone: within normal limits Gait & Station: normal Patient leans: N/A  Psychiatric Specialty Exam:   Blood pressure 129/80, pulse 91, temperature 97.9 F (36.6 C), temperature source Oral, resp. rate 20, height 5\' 2"  (1.575 m), weight 68 kg, SpO2 97 %.Body mass index is 27.44 kg/m.  General Appearance: Casual  Eye Contact::  Good  Speech:  Clear and Coherent and Normal Rate409  Volume:  Normal  Mood:  Depressed  Affect:  Congruent and Depressed  Thought Process:  Coherent, Goal Directed, Linear and Descriptions of Associations: Intact  Orientation:  Full (Time, Place, and Person)  Thought Content:  Logical  Suicidal Thoughts:  No  Homicidal Thoughts:  No  Memory:  Immediate;   Good Recent;   Fair Remote;   Fair  Judgement:  Poor  Insight:  Shallow  Psychomotor Activity:  Normal  Concentration:  Good  Recall:  Good  Fund of Knowledge:Good   Language: Good  Akathisia:  No  Handed:  Right  AIMS (if indicated):     Assets:  Architect Housing Social Support  Sleep:     Cognition: WNL  ADL's:  Intact   Mental Status Per Nursing Assessment::   On Admission:    Pt was seen and chart reviewed with treatment team and Dr Sharma Covert. Pt denies suicidal/homicidal ideation, denies auditory/visual hallucinations and does not appear to be responding to internal stimuli. Pt's UDS negative, BAL 180 on admission. Pt has a history of alcoholism and stated she was in a facility in [place] for thirty days prior to returning to [facility]. She initially denied drinking but when confronted with her BAL she admitted to drinking wine. Pt has multiple legal charges pending which involve, reckless driving, property damage and cyberstalking. Pt was IVC'd by her daughter yesterday for drinking and her home being unsanitary and with feces in it. Pt denies that she has a problem and declined the offer to speak with Peer Support. Pt is psychiatrically clear for discharge.   Demographic Factors:  Caucasian, Low socioeconomic status and Unemployed  Loss Factors: Legal issues and Financial problems/change in socioeconomic status  Historical Factors: Family history of mental illness or substance abuse  Risk Reduction Factors:   Sense of responsibility to family  Continued Clinical Symptoms:  Alcohol/Substance Abuse/Dependencies  Cognitive Features That Contribute To Risk:  Closed-mindedness    Suicide Risk:  Minimal: No identifiable suicidal ideation.  Patients presenting with no risk factors but with morbid ruminations; may be classified as minimal risk based on the severity of the depressive symptoms    Plan Of Care/Follow-up recommendations:  Activity:  as tolerated Diet:  Heart Healthy  Laveda Abbe, NP 11/18/2017, 11:23 AM

## 2017-11-18 NOTE — ED Notes (Signed)
Patient had diarrhea stool and asked for some immodium.  She reports she had a diarrhea stool last night also.  NP notified.

## 2017-11-18 NOTE — ED Notes (Signed)
Bed: WBH39 Expected date:  Expected time:  Means of arrival:  Comments: 

## 2017-11-18 NOTE — Progress Notes (Signed)
CSW received phone call from patient's mother regarding patient disposition. CSW aware patient has been psychiatrically and medically cleared for discharge. Patient's mother, expressed concerns with patient being discharged stating something has to be done about patient's situation. Patient's mother, did not understand why patient could not be admitted for psychiatric treatment. CSW explained to patient's mother that the psychiatry team did not feel patient was a danger to herself or others and was not actively psychotic so she did not meet inpatient psychiatry treatment. CSW explained that since patient is her own legal guardian, she is able to make her own decisions. CSW aware patient was offered substance use resources but declined them. CSW aware patient's RN is working with patient to find transportation. Per RN, patient has cash in her purse and can afford to take a taxi home.   Archie Balboa, LCSWA  Clinical Social Work Department  [employer]  [phone]

## 2017-12-09 ENCOUNTER — Encounter

## 2017-12-27 IMAGING — US US ABDOMEN COMPLETE
1 series · 14 of 25 positions shown · non-contrast
Comparison: No prior.

CLINICAL DATA: Abdominal pain.

EXAM:
ABDOMEN ULTRASOUND COMPLETE

[Series 1: us abdomen complete · 0.28mm/px · 14 of 87 slices shown]
[im 1/87]
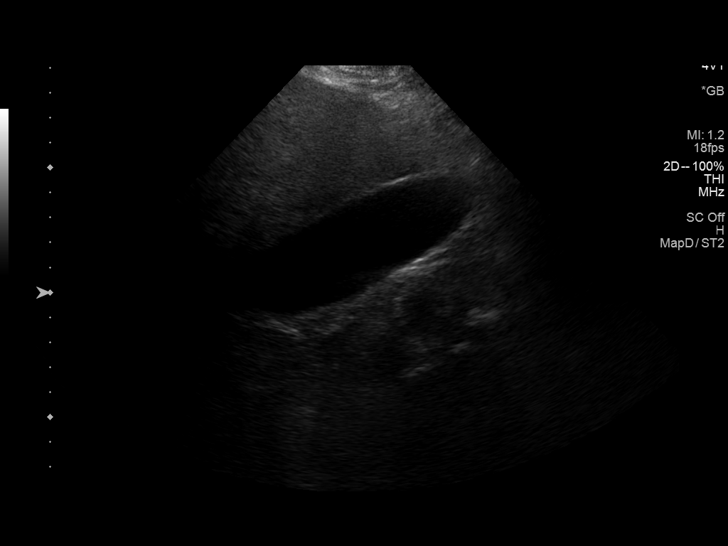
[im 8/87]
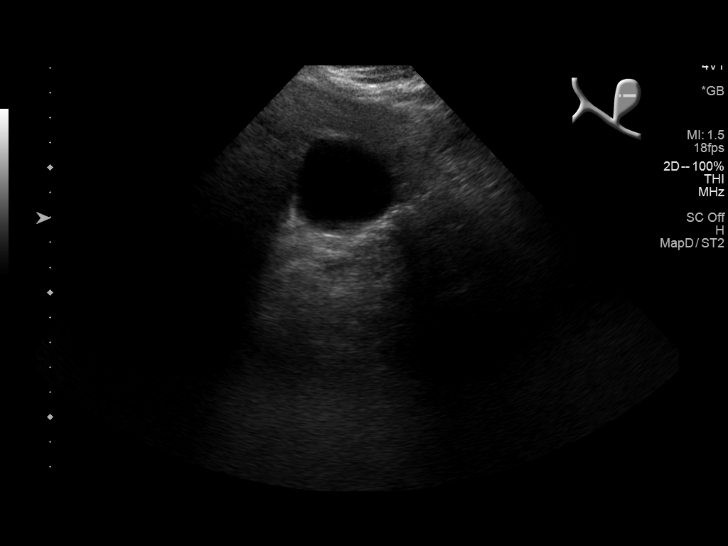
[im 15/87]
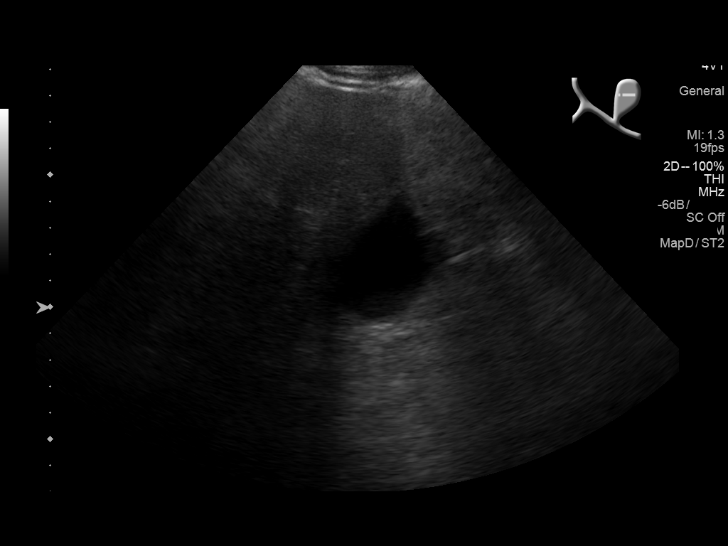
[im 22/87]
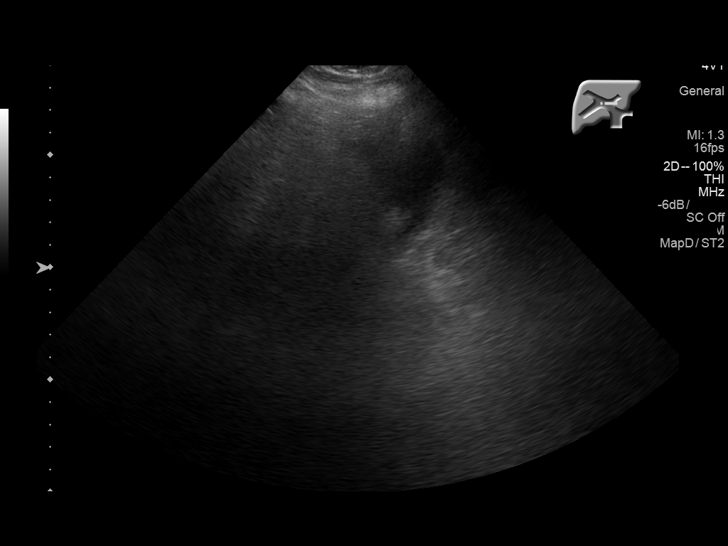
[im 29/87]
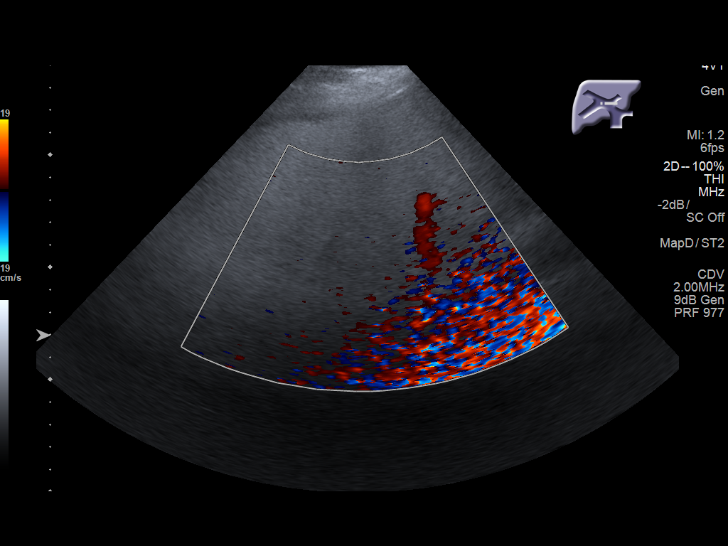
[im 33/87]
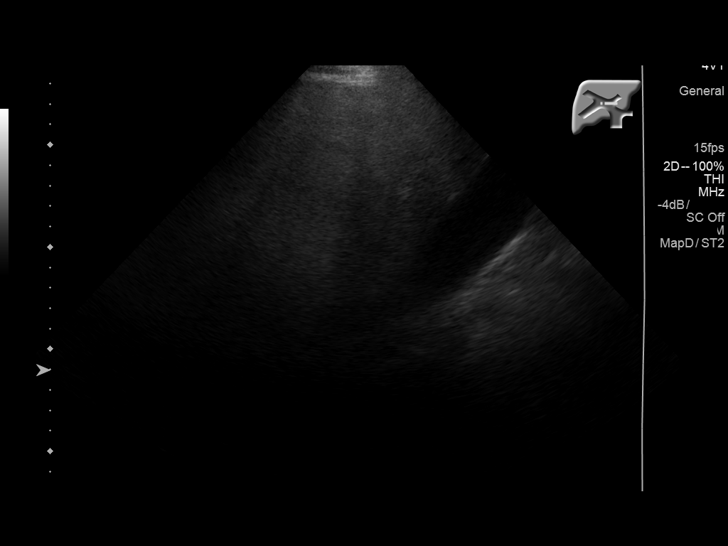
[im 40/87]
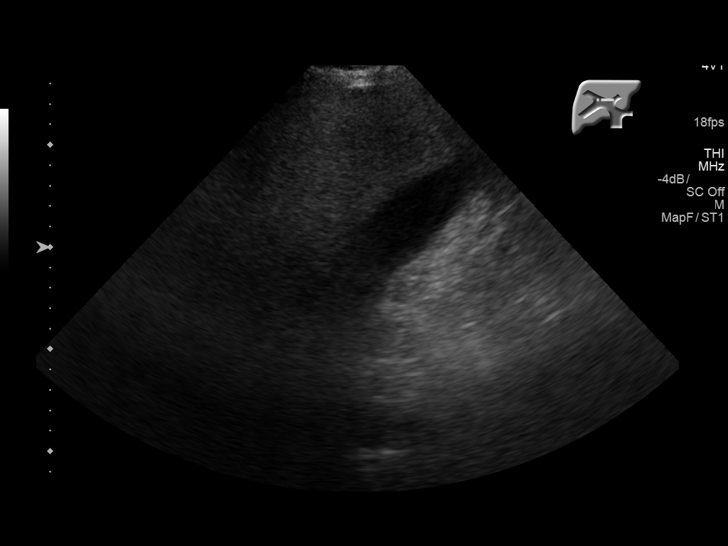
[im 47/87]
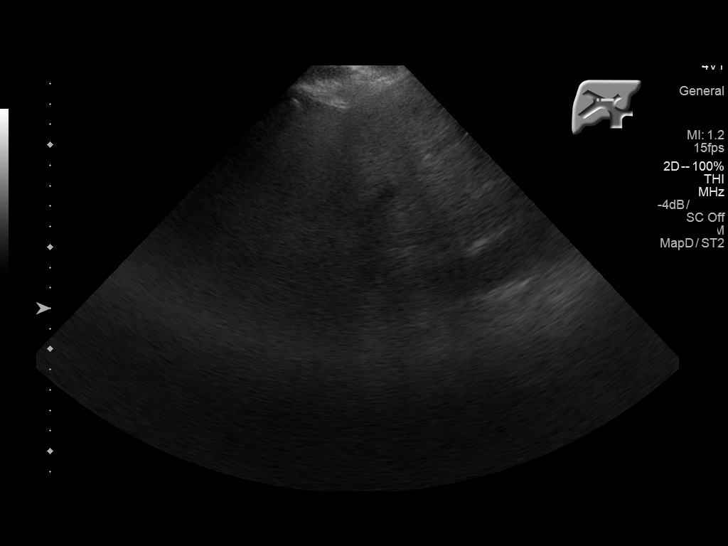
[im 54/87]
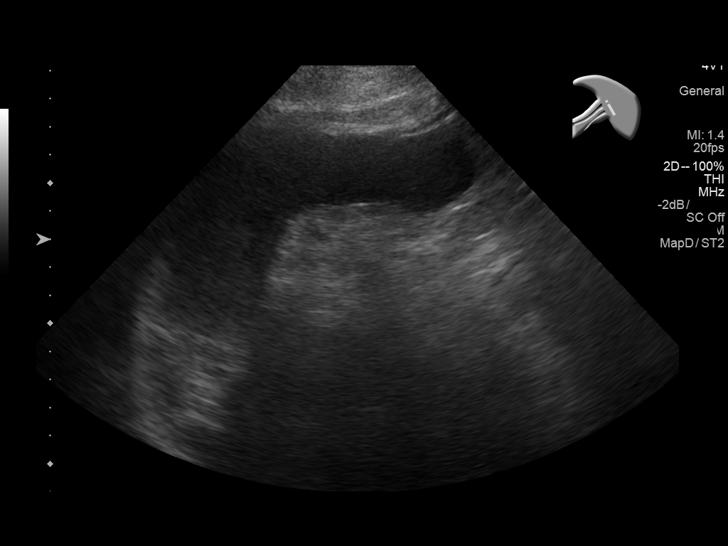
[im 58/87]
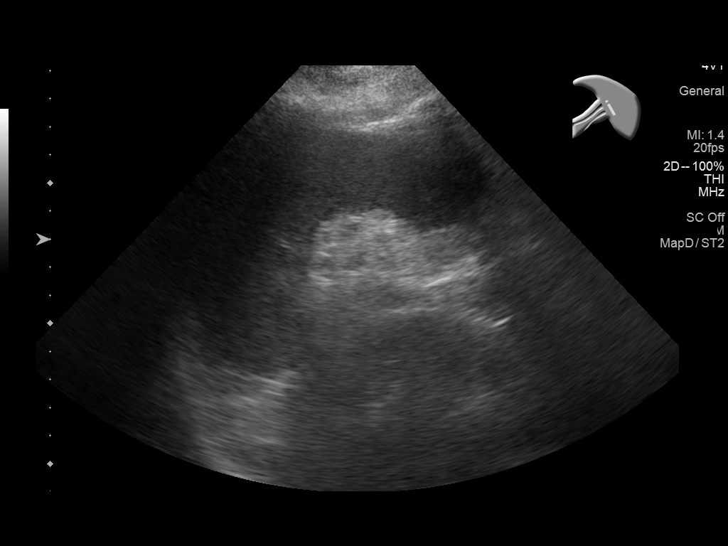
[im 65/87]
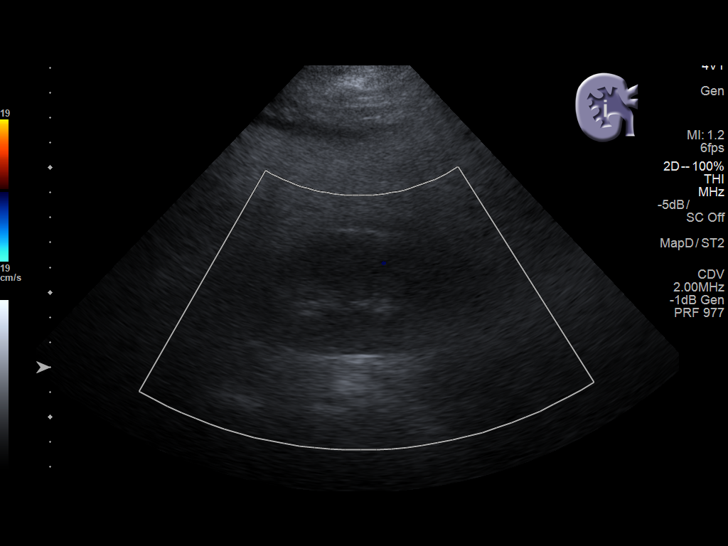
[im 72/87]
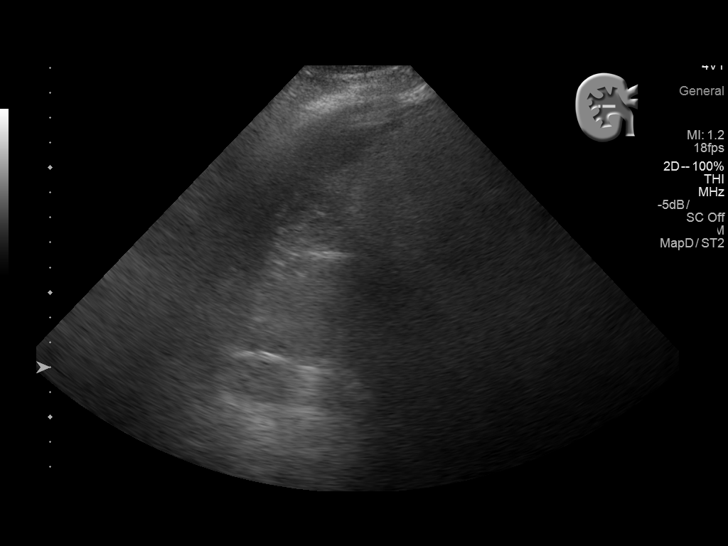
[im 79/87]
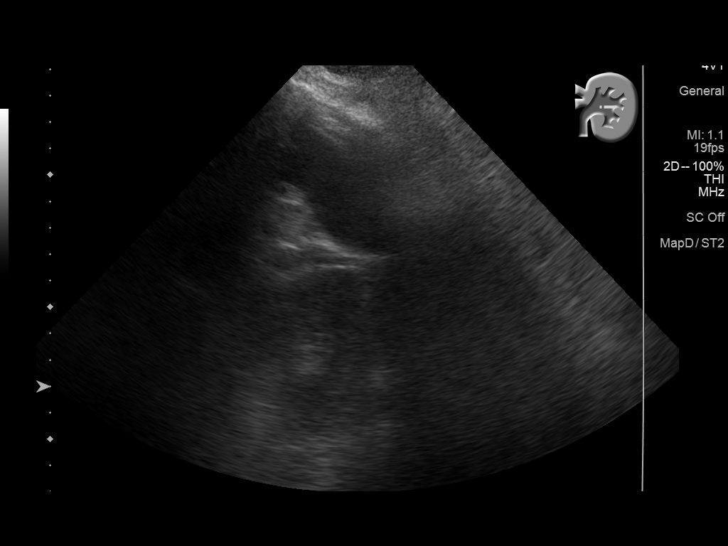
[im 87/87]
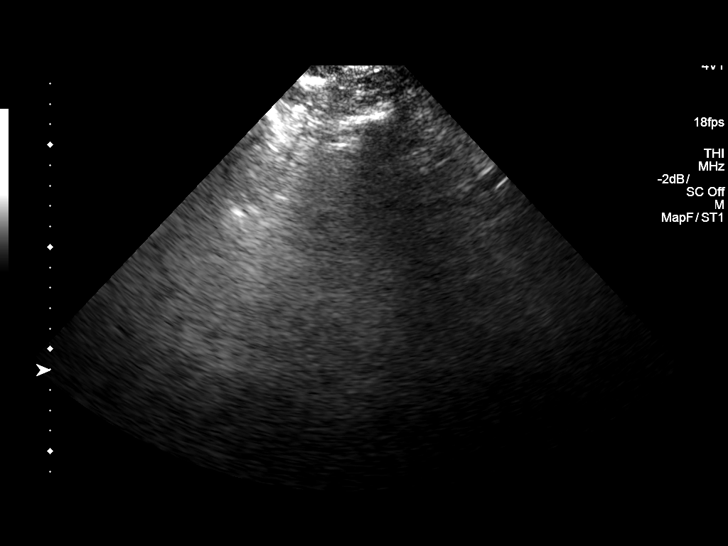

[14 of 25 positions shown; findings below may reference images not displayed]

FINDINGS: Gallbladder: No gallstones or wall thickening visualized. No
sonographic Murphy sign noted by sonographer.

Common bile duct: Diameter: 4.1 mm

Liver: Liver is echogenic consistent fatty infiltration and/or
hepatocellular disease. No focal hepatic abnormality identified .

IVC: No abnormality visualized.

Pancreas: Visualized portion unremarkable.

Spleen: Size and appearance within normal limits.

Right Kidney: Length: 10.8 cm. Echogenicity within normal limits. No
mass or hydronephrosis visualized.

Left Kidney: Length: 10.2 cm. Echogenicity within normal limits. No
mass or hydronephrosis visualized.

Abdominal aorta: No aneurysm visualized.

Other findings: Mild ascites.
IMPRESSION: 1. Liver is echogenic consistent fatty infiltration and/or
hepatocellular disease. No focal hepatic abnormality identified.

2.  Mild ascites.

## 2017-12-30 IMAGING — CR DG CHEST 2V
2 series · 2 of 2 positions shown · non-contrast
Comparison: 04/06/2013

CLINICAL DATA: Cough.  Alcohol intoxication.

EXAM:
CHEST  2 VIEW

[w chest pa]
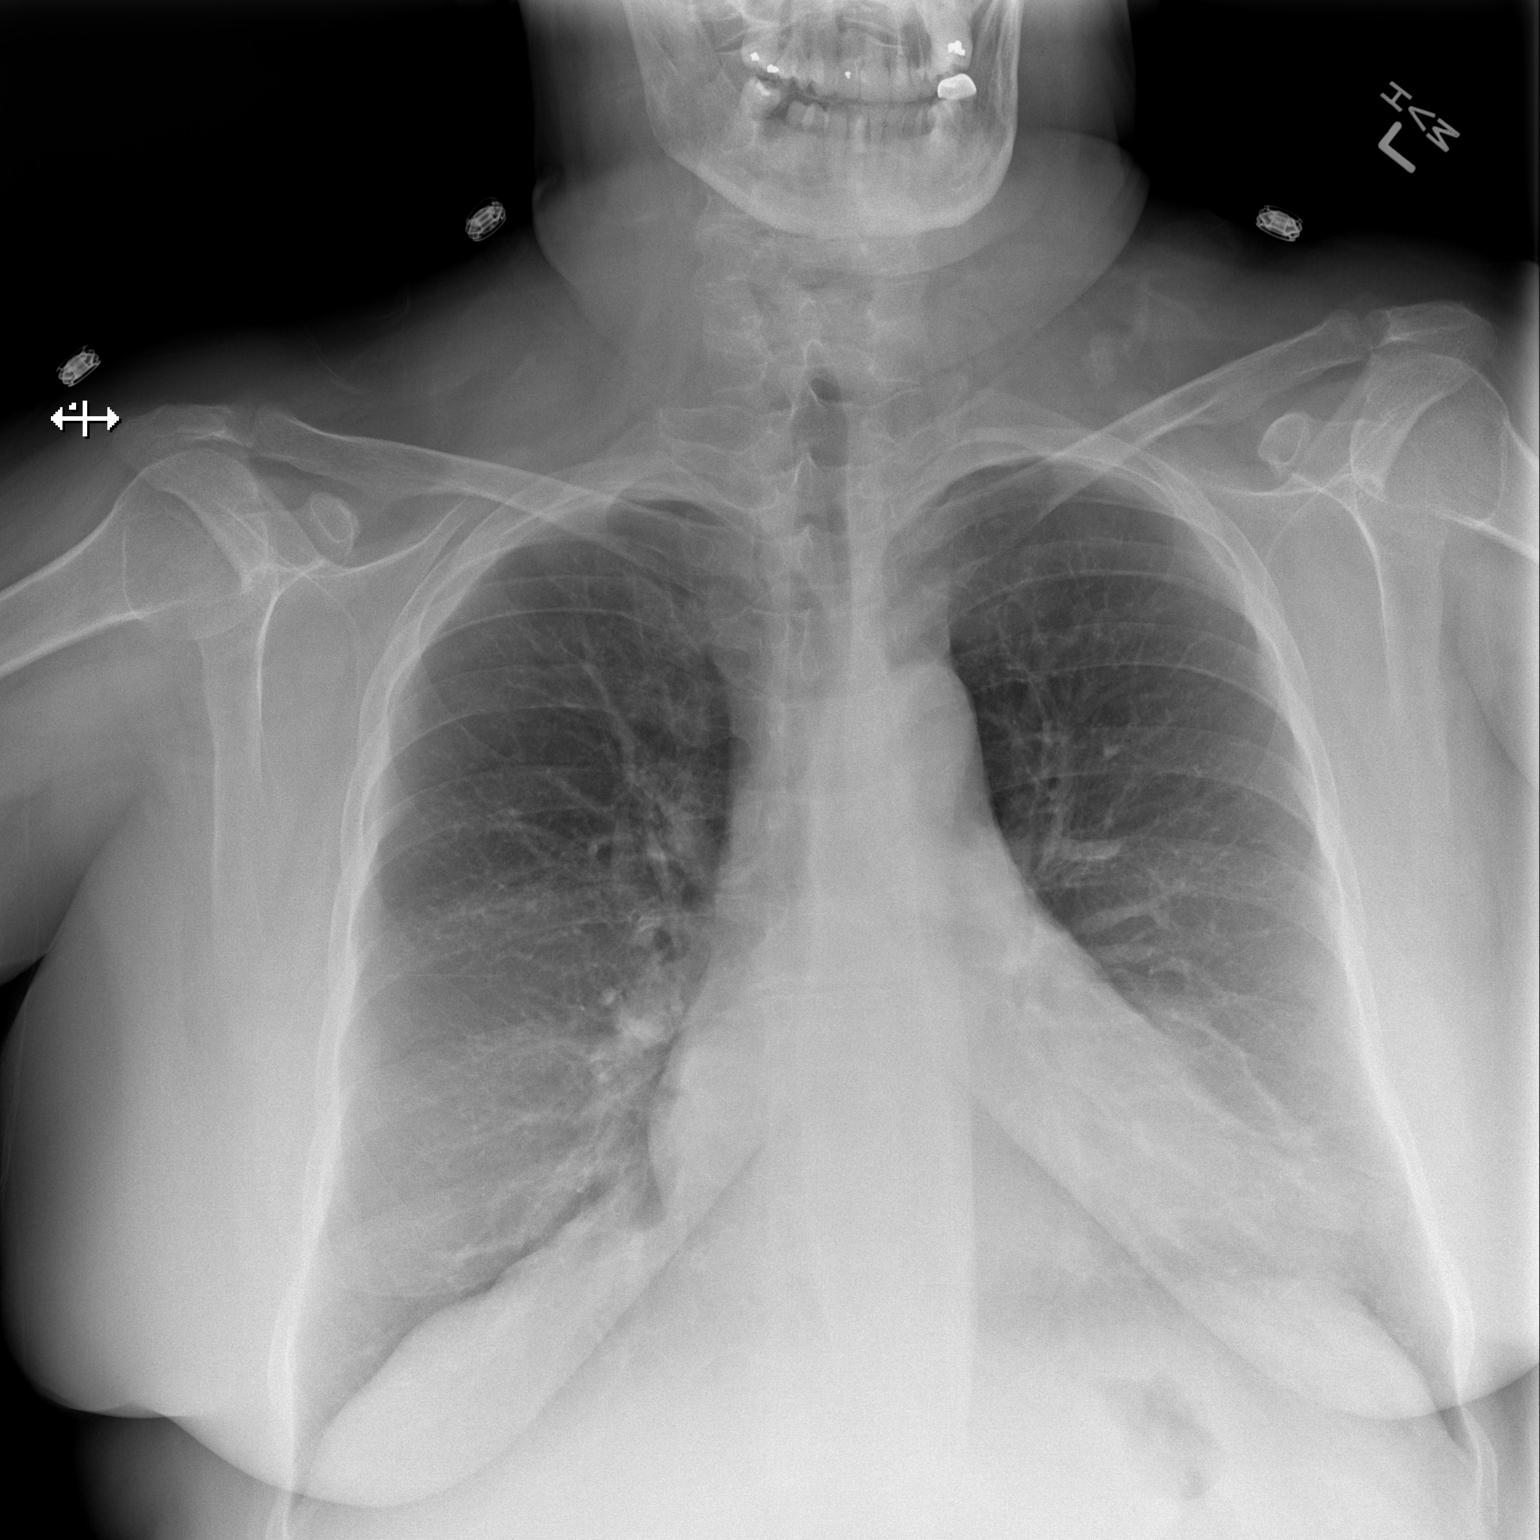

[w chest lat]
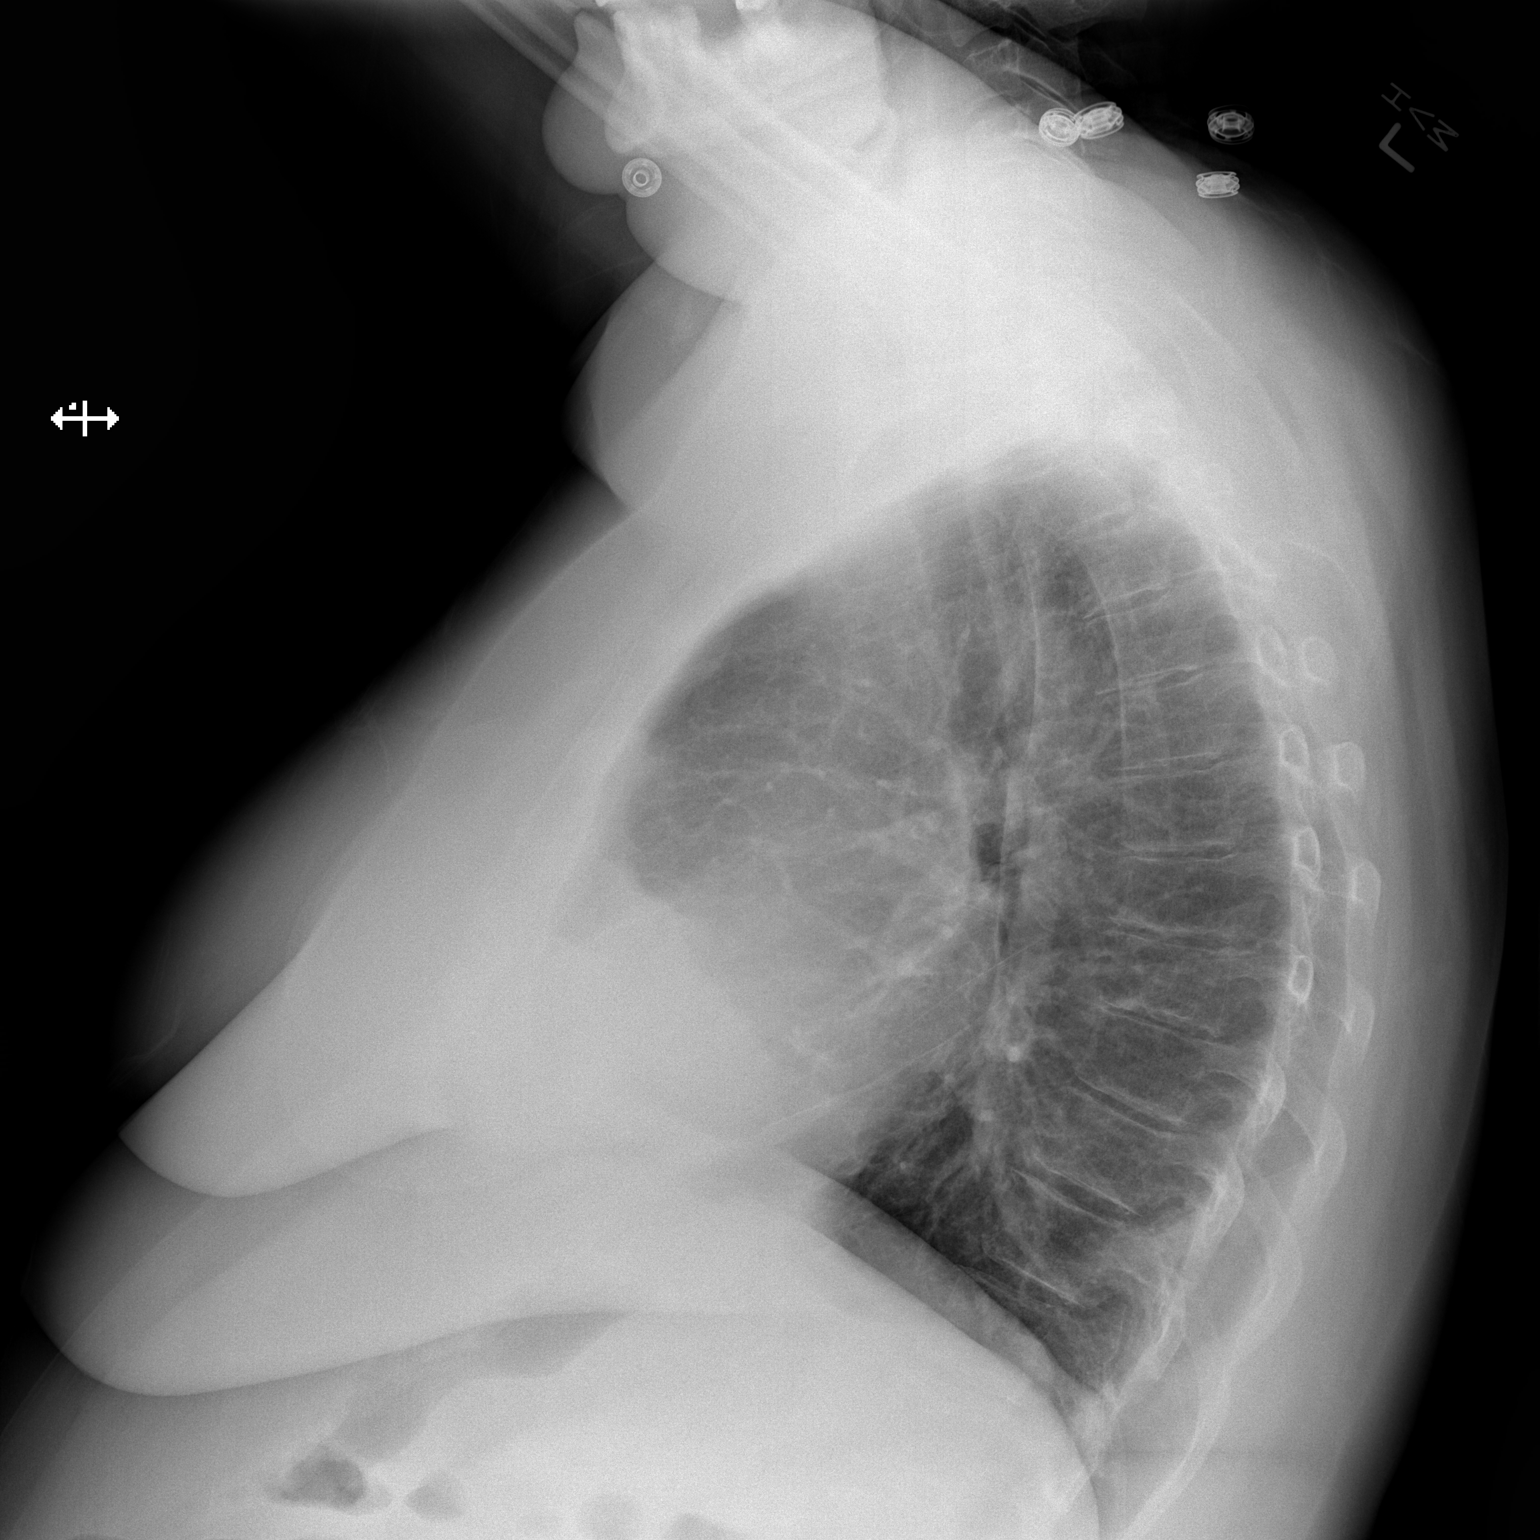

[2 of 2 positions shown; findings below may reference images not displayed]

FINDINGS: The heart size and mediastinal contours are within normal limits.
Both lungs are clear. The visualized skeletal structures are
unremarkable.
IMPRESSION: No active cardiopulmonary disease.

## 2018-01-10 ENCOUNTER — Other Ambulatory Visit: Payer: Self-pay

## 2018-01-10 ENCOUNTER — Emergency Department (HOSPITAL_COMMUNITY)
Admission: EM | Admit: 2018-01-10 | Discharge: 2018-01-10 | Disposition: A | Discharge status: Home / Self Care | Payer: 59 | Attending: Emergency Medicine | Admitting: Emergency Medicine

## 2018-01-10 ENCOUNTER — Encounter (HOSPITAL_COMMUNITY): Payer: Self-pay | Admitting: Emergency Medicine

## 2018-01-10 DIAGNOSIS — F17210 Nicotine dependence, cigarettes, uncomplicated: Secondary | ICD-10-CM | POA: Diagnosis not present

## 2018-01-10 DIAGNOSIS — K7031 Alcoholic cirrhosis of liver with ascites: Secondary | ICD-10-CM | POA: Diagnosis not present

## 2018-01-10 DIAGNOSIS — J449 Chronic obstructive pulmonary disease, unspecified: Secondary | ICD-10-CM | POA: Insufficient documentation

## 2018-01-10 DIAGNOSIS — R519 Headache, unspecified: Secondary | ICD-10-CM

## 2018-01-10 DIAGNOSIS — G35 Multiple sclerosis: Secondary | ICD-10-CM | POA: Diagnosis not present

## 2018-01-10 DIAGNOSIS — E876 Hypokalemia: Secondary | ICD-10-CM | POA: Diagnosis not present

## 2018-01-10 DIAGNOSIS — Z79899 Other long term (current) drug therapy: Secondary | ICD-10-CM | POA: Insufficient documentation

## 2018-01-10 DIAGNOSIS — F10920 Alcohol use, unspecified with intoxication, uncomplicated: Secondary | ICD-10-CM | POA: Insufficient documentation

## 2018-01-10 DIAGNOSIS — R51 Headache: Secondary | ICD-10-CM

## 2018-01-10 DIAGNOSIS — I4891 Unspecified atrial fibrillation: Secondary | ICD-10-CM | POA: Insufficient documentation

## 2018-01-10 LAB — COMPREHENSIVE METABOLIC PANEL
ALK PHOS: 108 U/L (ref 38–126)
ALT: 13 U/L (ref 0–44)
AST: 26 U/L (ref 15–41)
Albumin: 3.6 g/dL (ref 3.5–5.0)
Anion gap: 12 (ref 5–15)
BILIRUBIN TOTAL: 1.6 mg/dL — AB (ref 0.3–1.2)
CALCIUM: 8 mg/dL — AB (ref 8.9–10.3)
CO2: 29 mmol/L (ref 22–32)
Chloride: 99 mmol/L (ref 98–111)
Creatinine, Ser: 0.33 mg/dL — ABNORMAL LOW (ref 0.44–1.00)
GFR calc Af Amer: >60 mL/min (ref >60)
GLUCOSE: 117 mg/dL — AB (ref 70–99)
Potassium: 3.1 mmol/L — ABNORMAL LOW (ref 3.5–5.1)
Sodium: 140 mmol/L (ref 135–145)
TOTAL PROTEIN: 6.3 g/dL — AB (ref 6.5–8.1)

## 2018-01-10 LAB — CBC
HEMATOCRIT: 36.6 % (ref 36.0–46.0)
HEMOGLOBIN: 11.5 g/dL — AB (ref 12.0–15.0)
MCH: 27.4 pg (ref 26.0–34.0)
MCHC: 31.4 g/dL (ref 30.0–36.0)
MCV: 87.4 fL (ref 80.0–100.0)
NRBC: 0 % (ref 0.0–0.2)
Platelets: 104 10*3/uL — ABNORMAL LOW (ref 150–400)
RBC: 4.19 MIL/uL (ref 3.87–5.11)
RDW: 19.8 % — ABNORMAL HIGH (ref 11.5–15.5)
WBC: 8.2 10*3/uL (ref 4.0–10.5)

## 2018-01-10 LAB — PROTIME-INR
INR: 1.34
Prothrombin Time: 16.4 seconds — ABNORMAL HIGH (ref 11.4–15.2)

## 2018-01-10 LAB — ETHANOL: Alcohol, Ethyl (B): 211 mg/dL — ABNORMAL HIGH (ref <10)

## 2018-01-10 LAB — LIPASE, BLOOD: LIPASE: 27 U/L (ref 11–51)

## 2018-01-10 MED ORDER — METOCLOPRAMIDE HCL 5 MG/ML IJ SOLN
10.0000 mg | Freq: Once | INTRAMUSCULAR | Status: AC
  Administered 2018-01-10: 10 mg via INTRAVENOUS
  Filled 2018-01-10: qty 2

## 2018-01-10 MED ORDER — LORAZEPAM 2 MG/ML IJ SOLN
1.0000 mg | Freq: Once | INTRAMUSCULAR | Status: AC
  Administered 2018-01-10: 1 mg via INTRAVENOUS
  Filled 2018-01-10: qty 1

## 2018-01-10 MED ORDER — DILTIAZEM LOAD VIA INFUSION
20.0000 mg | Freq: Once | INTRAVENOUS | Status: DC
  Filled 2018-01-10: qty 20

## 2018-01-10 MED ORDER — POTASSIUM CHLORIDE CRYS ER 20 MEQ PO TBCR: 20.0000 meq | EXTENDED_RELEASE_TABLET | Freq: Every day | ORAL | 0 refills | Status: DC

## 2018-01-10 MED ORDER — SODIUM CHLORIDE 0.9 % IV BOLUS
500.0000 mL | Freq: Once | INTRAVENOUS | Status: AC
  Administered 2018-01-10: 500 mL via INTRAVENOUS

## 2018-01-10 MED ORDER — FAMOTIDINE 20 MG PO TABS
20.0000 mg | ORAL_TABLET | Freq: Once | ORAL | Status: AC
  Administered 2018-01-10: 20 mg via ORAL
  Filled 2018-01-10: qty 1

## 2018-01-10 MED ORDER — POTASSIUM CHLORIDE CRYS ER 20 MEQ PO TBCR
40.0000 meq | EXTENDED_RELEASE_TABLET | Freq: Once | ORAL | Status: AC
  Administered 2018-01-10: 40 meq via ORAL
  Filled 2018-01-10: qty 2

## 2018-01-10 MED ORDER — DIPHENHYDRAMINE HCL 50 MG/ML IJ SOLN
25.0000 mg | Freq: Once | INTRAMUSCULAR | Status: AC
  Administered 2018-01-10: 25 mg via INTRAVENOUS
  Filled 2018-01-10: qty 1

## 2018-01-10 MED ORDER — DILTIAZEM HCL-DEXTROSE 100-5 MG/100ML-% IV SOLN (PREMIX)
5.0000 mg/h | INTRAVENOUS | Status: DC
  Filled 2018-01-10: qty 100

## 2018-01-10 MED ORDER — ONDANSETRON HCL 4 MG/2ML IJ SOLN
4.0000 mg | Freq: Once | INTRAMUSCULAR | Status: AC
  Administered 2018-01-10: 4 mg via INTRAVENOUS
  Filled 2018-01-10: qty 2

## 2018-01-10 MED ORDER — ALUM & MAG HYDROXIDE-SIMETH 200-200-20 MG/5ML PO SUSP
15.0000 mL | Freq: Once | ORAL | Status: AC
  Administered 2018-01-10: 15 mL via ORAL
  Filled 2018-01-10: qty 30

## 2018-01-10 NOTE — Discharge Instructions (Addendum)
It was our pleasure to provide your ER care today - we hope that you feel better.  Avoid drinking any alcohol - follow up with AA, and use resource guide provided for additional resources.  From today's lab tests, your potassium level is low (3.1) - eat plenty of fruits and vegetables, take potassium as prescribed, and follow up with primary care doctor in 1 week.  Follow up with your doctor in the next 2-3 days for recheck.  Return to ER if worse, new symptoms, fevers, new or severe pain, persistent vomiting, persistent fast heart beat, fainting, trouble breathing, other concern.   No driving for the next 6 hours, or any time when drinking alcohol.

## 2018-01-10 NOTE — ED Notes (Signed)
Pt states that her mother is her only ride and is on her way to get her in about 30 minutes.  Informed pt that she is not up for discharge at this time. Pt states, "Well, I have got to go because I have things I need to take care of".  Pt looking for personal cell phone. Was found on the floor in the room. Gave to patient as she was making more phone calls.

## 2018-01-10 NOTE — ED Notes (Signed)
Pt on phone about financial issues.

## 2018-01-10 NOTE — ED Notes (Addendum)
Made dr Denton Lank aware that patient is leaving due to her ride on the way "and have things I have to take care of". Pt left before receiving discharge paperwork

## 2018-01-10 NOTE — ED Notes (Signed)
Noticed patient now showing A fib on heart monitor. EKG obtained and given to Dr Denton Lank, dont see Hx of a fib in patient's chart.

## 2018-01-10 NOTE — ED Notes (Addendum)
Pt given paper scrubs to wear home due to her pants soiled with feces.

## 2018-01-10 NOTE — ED Notes (Signed)
Bed: ZO10 Expected date:  Expected time:  Means of arrival:  Comments: EMS 57 yo female "feels unwell"-wheezing all lobes-hx COPD-ascites-RUQ pain 138/64-duoneb

## 2018-01-10 NOTE — ED Provider Notes (Addendum)
[facility]-EMERGENCY DEPT Provider Note   CSN: [id number] Arrival date & time: 01/10/18  4098     History   Chief Complaint Chief Complaint  Patient presents with  . Weakness  . Headache  . Generalized Body Aches    HPI SHARHONDA ATWOOD is a 57 y.o. female.  Patient with hx etoh cirrhosis, c/o nausea, 'migraine', diffuse body aches which she states is chronic (years) and related to her MS.  Symptoms gradual onset, worsening, for past several days. No acute/abrupt change today. Continues to drink etoh. Denies hx seizures or dts. No vomiting. Normal appetite. Having normal bms. No abd pain. No fever or chills. Headache c/w prior headaches/'migraines', gradual onset, dull, frontal. No eye pain or change in vision. No neck pain or stiffness. No associated numbness or weakness, no change in normal function. Denies trauma, fall or head injury.   The history is provided by the patient.  Abdominal Pain   Associated symptoms include headaches. Pertinent negatives include fever, diarrhea, vomiting and dysuria.  Headache   Pertinent negatives include no fever, no shortness of breath and no vomiting.    Past Medical History:  Diagnosis Date  . ADHD (attention deficit hyperactivity disorder)   . Alcoholism (HCC)   . Anxiety   . Arthritis   . Ascites   . Bipolar disorder (HCC)   . Cirrhosis (HCC)   . COPD (chronic obstructive pulmonary disease) (HCC)   . Hypokalemia   . Migraine   . Multiple sclerosis (HCC)   . Narcolepsy   . Osteoporosis     Patient Active Problem List   Diagnosis Date Noted  . Weakness 08/21/2017  . COPD exacerbation (HCC) 06/12/2017  . Chest pain with moderate risk for cardiac etiology 05/30/2017  . Abnormal LFTs 05/30/2017  . Tachycardia 05/30/2017  . COPD (chronic obstructive pulmonary disease) (HCC) 04/19/2017  . Right rib fracture 04/15/2017  . Abdominal wall cellulitis 12/30/2016  . S/P exploratory laparotomy 12/30/2016  . Distal  radius fracture, left 09/09/2016  . Thrombocytopenia (HCC) 09/09/2016  . Chest x-ray abnormality   . Umbilical hernia without obstruction and without gangrene 05/12/2016  . Itching 02/24/2016  . Ventral hernia without obstruction or gangrene 02/24/2016  . Palliative care encounter   . Ascites   . Tachypnea   . Atrial flutter with rapid ventricular response (HCC)   . Hypomagnesemia   . Hypoxia   . Dehydration 01/22/2016  . Low back ache 12/30/2015  . Non-intractable vomiting with nausea   . Alcohol abuse with alcohol-induced mood disorder (HCC) 12/15/2015  . Acute alcoholism (HCC)   . Abdominal distension 11/04/2015  . Alcohol abuse   . Dyspnea   . Acute respiratory failure (HCC) 10/15/2015  . Ascites due to alcoholic cirrhosis (HCC) 10/15/2015  . Osteoporosis   . Bipolar I disorder (HCC)   . Multiple sclerosis (HCC)   . Alcoholic cirrhosis of liver with ascites (HCC) 08/22/2015  . Bipolar disorder, current episode mixed, moderate (HCC) 08/01/2015  . Alcohol use disorder, severe, dependence (HCC) 07/31/2015  . Macrocytic anemia- due to alcohol abuse with normal B12 & folate levels 05/31/2015  . Severe protein-calorie malnutrition (HCC) 05/31/2015  . Hypokalemia 05/26/2015  . Encephalopathy, hepatic (HCC) 05/26/2015  . Alcohol withdrawal (HCC) 05/18/2015  . Abdominal pain 05/18/2015  . Anxiety 05/18/2015  . UTI (lower urinary tract infection) 05/18/2015  . Stimulant abuse (HCC) 12/27/2014  . Nicotine abuse 12/27/2014  . Hyperprolactinemia (HCC) 11/15/2014  . Dyslipidemia   . Tobacco abuse  01/23/2014  . Noncompliance with therapeutic plan 04/04/2013  . Hereditary and idiopathic peripheral neuropathy 05/01/2012  . GERD (gastroesophageal reflux disease) 05/01/2012  . Osteoarthrosis, unspecified whether generalized or localized, involving lower leg 05/01/2012  . Hyponatremia 07/01/2011    Past Surgical History:  Procedure Laterality Date  . ABDOMINAL WALL DEFECT REPAIR N/A  12/30/2016   Procedure: EXPLORATORY LAPAROTOMY WITH REPAIR ABDOMINAL WALL VENTRAL HERNIA;  Surgeon: [name], MD;  Location: [facility] OR;  Service: General;  Laterality: N/A;  . APPLICATION OF WOUND VAC N/A 12/30/2016   Procedure: APPLICATION OF WOUND VAC;  Surgeon: [name], MD;  Location: [facility] OR;  Service: General;  Laterality: N/A;  . CESAREAN SECTION  [phone]  . FRACTURE SURGERY    . HERNIA REPAIR    . MYRINGOTOMY WITH TUBE PLACEMENT Bilateral   . ORIF WRIST FRACTURE Left 09/09/2016   Procedure: OPEN REDUCTION INTERNAL FIXATION (ORIF) LEFT WRIST FRACTURE, LEFT CARPAL TUNNEL RELEASE;  Surgeon: Dominica Severin, MD;  Location: [facility] ORS;  Service: Orthopedics;  Laterality: Left;  . TONSILLECTOMY       OB History   None      Home Medications    Prior to Admission medications   Medication Sig Start Date End Date Taking? Authorizing Provider  bismuth subsalicylate (PEPTO-BISMOL) 262 MG chewable tablet Chew 2 tablets (524 mg total) by mouth as needed for diarrhea or loose stools. Patient not taking: Reported on 11/07/2017 07/08/17   Molpus, John, MD  furosemide (LASIX) 40 MG tablet Take 1 tablet (40 mg total) by mouth daily. Patient not taking: Reported on 11/18/2017 06/17/16   Midge Minium, MD  gabapentin (NEURONTIN) 100 MG capsule Take 2 capsules (200 mg total) by mouth 3 (three) times daily. Patient not taking: Reported on 11/18/2017 08/31/17   Rolan Bucco, MD  hydrocortisone cream 1 % Apply topically 3 (three) times daily. Patient not taking: Reported on 11/18/2017 04/20/17   Marguerita Merles Latif, DO  ondansetron (ZOFRAN-ODT) 8 MG disintegrating tablet Take 4 mg by mouth every 8 (eight) hours as needed for nausea or vomiting. Place 8mg  inside cheek     [provider]  pantoprazole (PROTONIX) 40 MG tablet Take 40 mg by mouth daily.    [provider]  potassium chloride SA (K-DUR,KLOR-CON) 20 MEQ tablet Take 1 tablet (20 mEq total) by mouth daily for 5  doses. 08/28/17 11/07/17  Maczis, Elmer Sow, PA-C  promethazine (PHENERGAN) 25 MG tablet Take 25 mg by mouth every 6 (six) hours as needed for nausea or vomiting.    [provider]  risperiDONE (RISPERDAL) 0.5 MG tablet Take 1 tablet (0.5 mg total) by mouth 3 (three) times daily as needed (agitation). Patient not taking: Reported on 11/18/2017 02/06/16   Renne Musca, MD  rizatriptan (MAXALT) 10 MG tablet Take 10mg  by mouth every 2 hours as needed for migraine.  May repeat in 2 hours and max 24 hour dose is 30mg . 08/23/17   Gherghe, Daylene Katayama, MD  topiramate (TOPAMAX) 50 MG tablet Take 50 mg by mouth daily.    [provider]    Family History Family History  Problem Relation Age of Onset  . Arrhythmia Mother   . Heart disease Father   . Hypertension Father     Social History Social History   Tobacco Use  . Smoking status: Current Every Day Smoker    Packs/day: 1.00    Years: 35.00    Pack years: 35.00    Types: Cigarettes  . Smokeless tobacco:  Never Used  Substance Use Topics  . Alcohol use: Not Currently  . Drug use: No     Allergies   Patient has no known allergies.   Review of Systems Review of Systems  Constitutional: Negative for fever.  HENT: Negative for sore throat.   Eyes: Negative for pain and visual disturbance.  Respiratory: Negative for cough and shortness of breath.   Cardiovascular: Negative for chest pain.  Gastrointestinal: Negative for diarrhea and vomiting.  Genitourinary: Negative for dysuria and flank pain.  Musculoskeletal: Negative for back pain, neck pain and neck stiffness.  Skin: Negative for rash.  Neurological: Positive for headaches. Negative for syncope, speech difficulty, weakness and numbness.  Hematological:       No anticoag use.   Psychiatric/Behavioral: Negative for confusion.     Physical Exam Updated Vital Signs BP 115/77   Pulse (!) 101   Temp 98.2 F (36.8 C)   Resp 19   Ht 1.575 m (5\' 2" )   Wt  81.6 kg   SpO2 99%   BMI 32.92 kg/m   Physical Exam  Constitutional: She is oriented to person, place, and time. She appears well-developed and well-nourished.  HENT:  Head: Atraumatic.  Nose: Nose normal.  Mouth/Throat: Oropharynx is clear and moist.  No sinus or temporal tenderness.  Eyes: Pupils are equal, round, and reactive to light. Conjunctivae and EOM are normal. No scleral icterus.  Neck: Neck supple. No tracheal deviation present. No thyromegaly present.  No stiffness or rigidity.   Cardiovascular: Normal rate, regular rhythm, normal heart sounds and intact distal pulses. Exam reveals no gallop and no friction rub.  No murmur heard. Pulmonary/Chest: Effort normal and breath sounds normal. No respiratory distress.  Abdominal: Soft. Normal appearance and bowel sounds are normal. She exhibits no distension and no mass. There is no tenderness. There is no guarding.  No tense ascites.   Genitourinary:  Genitourinary Comments: No cva tenderness.  Musculoskeletal: Normal range of motion. She exhibits no edema or tenderness.  Neurological: She is alert and oriented to person, place, and time. No cranial nerve deficit.  Speech clear/fluent. Motor intact bilaterally, stre 5/5, sens grossly intact. Steady gait.   Skin: Skin is warm and dry. No rash noted. She is not diaphoretic.  Psychiatric: She has a normal mood and affect.  Nursing note and vitals reviewed.    ED Treatments / Results  Labs (all labs ordered are listed, but only abnormal results are displayed) Results for orders placed or performed during the hospital encounter of 01/10/18  Comprehensive metabolic panel  Result Value Ref Range   Sodium 140 135 - 145 mmol/L   Potassium 3.1 (L) 3.5 - 5.1 mmol/L   Chloride 99 98 - 111 mmol/L   CO2 29 22 - 32 mmol/L   Glucose, Bld 117 (H) 70 - 99 mg/dL   BUN <5 (L) 6 - 20 mg/dL   Creatinine, Ser 1.61 (L) 0.44 - 1.00 mg/dL   Calcium 8.0 (L) 8.9 - 10.3 mg/dL   Total Protein  6.3 (L) 6.5 - 8.1 g/dL   Albumin 3.6 3.5 - 5.0 g/dL   AST 26 15 - 41 U/L   ALT 13 0 - 44 U/L   Alkaline Phosphatase 108 38 - 126 U/L   Total Bilirubin 1.6 (H) 0.3 - 1.2 mg/dL   GFR calc non Af Amer >60 >60 mL/min   GFR calc Af Amer >60 >60 mL/min   Anion gap 12 5 - 15  Lipase, blood  Result Value Ref Range   Lipase 27 11 - 51 U/L  Ethanol  Result Value Ref Range   Alcohol, Ethyl (B) 211 (H) <10 mg/dL  Protime-INR  Result Value Ref Range   Prothrombin Time 16.4 (H) 11.4 - 15.2 seconds   INR 1.34    EKG None  Radiology No results found.  Procedures Procedures (including critical care time)  Medications Ordered in ED Medications  sodium chloride 0.9 % bolus 500 mL (500 mLs Intravenous New Bag/Given (Non-Interop) 01/10/18 0735)  potassium chloride SA (K-DUR,KLOR-CON) CR tablet 40 mEq (has no administration in time range)  metoCLOPramide (REGLAN) injection 10 mg (10 mg Intravenous Given 01/10/18 0735)  diphenhydrAMINE (BENADRYL) injection 25 mg (25 mg Intravenous Given 01/10/18 0734)     Initial Impression / Assessment and Plan / ED Course  I have reviewed the triage vital signs and the nursing notes.  Pertinent labs & imaging results that were available during my care of the patient were reviewed by me and considered in my medical decision making (see chart for details).  Labs sent.   Iv ns 500 cc bolus. reglan iv, benadryl iv.   Reviewed nursing notes and prior charts for additional history.   Labs reviewed - etoh level high 211. k mildly low, kcl po given.  Po fluids provided.  Recheck - pt in afib/rvr, denies hx same. No chest pain or discomfort. No sob. cardizem iv bolus/gtt.  Recheck pt in sinus rhythm. cardizem d/cd.   Pt requests additional zofran. Also notes anxiety, hx same, and requests med 'for stomach'/epigastric discomfort. abd soft nt. pepcid po. maalox po. Po fluids.   Pt remains in nsr.   Recheck pt comfortable, nad, no nv. abd soft nt.   Pt  currently appears stable for d/c.  rec etoh cessation, close pcp f/u.  Return precautions provided.     Final Clinical Impressions(s) / ED Diagnoses   Final diagnoses:  None    ED Discharge Orders    None           Cathren Laine, MD 01/10/18 1017

## 2018-01-10 NOTE — ED Notes (Signed)
ED Provider at bedside. 

## 2018-01-11 ENCOUNTER — Telehealth (HOSPITAL_COMMUNITY): Payer: Self-pay | Admitting: *Deleted

## 2018-01-11 NOTE — Telephone Encounter (Signed)
° ° °  Patient's mother returned call, states she is going to advise her daughter to return to ED if symptoms get worse tonight. Patient's mother was provided direct number to afib clinic. Please call

## 2018-01-11 NOTE — Telephone Encounter (Signed)
No answer/busy; LM at mothers vcml for return call

## 2018-01-13 ENCOUNTER — Emergency Department (HOSPITAL_COMMUNITY): Payer: 59

## 2018-01-13 ENCOUNTER — Ambulatory Visit (HOSPITAL_COMMUNITY): Payer: 59 | Admitting: Nurse Practitioner

## 2018-01-13 ENCOUNTER — Encounter (HOSPITAL_COMMUNITY): Payer: Self-pay

## 2018-01-13 ENCOUNTER — Other Ambulatory Visit: Payer: Self-pay

## 2018-01-13 ENCOUNTER — Inpatient Hospital Stay (HOSPITAL_COMMUNITY)
Admission: EM | Admit: 2018-01-13 | Discharge: 2018-01-20 | DRG: 871 | Disposition: A | Discharge status: Home Health | Payer: 59 | Attending: Internal Medicine | Admitting: Internal Medicine

## 2018-01-13 DIAGNOSIS — R6520 Severe sepsis without septic shock: Secondary | ICD-10-CM | POA: Diagnosis not present

## 2018-01-13 DIAGNOSIS — E872 Acidosis: Secondary | ICD-10-CM | POA: Diagnosis present

## 2018-01-13 DIAGNOSIS — Z7951 Long term (current) use of inhaled steroids: Secondary | ICD-10-CM

## 2018-01-13 DIAGNOSIS — J9601 Acute respiratory failure with hypoxia: Secondary | ICD-10-CM | POA: Diagnosis not present

## 2018-01-13 DIAGNOSIS — F1020 Alcohol dependence, uncomplicated: Secondary | ICD-10-CM | POA: Diagnosis not present

## 2018-01-13 DIAGNOSIS — R0602 Shortness of breath: Secondary | ICD-10-CM | POA: Diagnosis not present

## 2018-01-13 DIAGNOSIS — K7030 Alcoholic cirrhosis of liver without ascites: Secondary | ICD-10-CM | POA: Diagnosis present

## 2018-01-13 DIAGNOSIS — Z79899 Other long term (current) drug therapy: Secondary | ICD-10-CM

## 2018-01-13 DIAGNOSIS — F319 Bipolar disorder, unspecified: Secondary | ICD-10-CM | POA: Diagnosis present

## 2018-01-13 DIAGNOSIS — F10239 Alcohol dependence with withdrawal, unspecified: Secondary | ICD-10-CM | POA: Diagnosis present

## 2018-01-13 DIAGNOSIS — Z811 Family history of alcohol abuse and dependence: Secondary | ICD-10-CM

## 2018-01-13 DIAGNOSIS — Z7952 Long term (current) use of systemic steroids: Secondary | ICD-10-CM | POA: Diagnosis not present

## 2018-01-13 DIAGNOSIS — K7031 Alcoholic cirrhosis of liver with ascites: Secondary | ICD-10-CM | POA: Diagnosis present

## 2018-01-13 DIAGNOSIS — F10939 Alcohol use, unspecified with withdrawal, unspecified: Secondary | ICD-10-CM | POA: Diagnosis present

## 2018-01-13 DIAGNOSIS — F10231 Alcohol dependence with withdrawal delirium: Secondary | ICD-10-CM | POA: Diagnosis present

## 2018-01-13 DIAGNOSIS — K219 Gastro-esophageal reflux disease without esophagitis: Secondary | ICD-10-CM | POA: Diagnosis present

## 2018-01-13 DIAGNOSIS — J449 Chronic obstructive pulmonary disease, unspecified: Secondary | ICD-10-CM | POA: Diagnosis present

## 2018-01-13 DIAGNOSIS — F17210 Nicotine dependence, cigarettes, uncomplicated: Secondary | ICD-10-CM | POA: Diagnosis present

## 2018-01-13 DIAGNOSIS — J189 Pneumonia, unspecified organism: Secondary | ICD-10-CM | POA: Insufficient documentation

## 2018-01-13 DIAGNOSIS — R059 Cough, unspecified: Secondary | ICD-10-CM

## 2018-01-13 DIAGNOSIS — F419 Anxiety disorder, unspecified: Secondary | ICD-10-CM | POA: Diagnosis present

## 2018-01-13 DIAGNOSIS — Z720 Tobacco use: Secondary | ICD-10-CM | POA: Diagnosis present

## 2018-01-13 DIAGNOSIS — D539 Nutritional anemia, unspecified: Secondary | ICD-10-CM | POA: Diagnosis present

## 2018-01-13 DIAGNOSIS — M810 Age-related osteoporosis without current pathological fracture: Secondary | ICD-10-CM | POA: Diagnosis present

## 2018-01-13 DIAGNOSIS — J690 Pneumonitis due to inhalation of food and vomit: Secondary | ICD-10-CM | POA: Diagnosis present

## 2018-01-13 DIAGNOSIS — G9341 Metabolic encephalopathy: Secondary | ICD-10-CM | POA: Diagnosis present

## 2018-01-13 DIAGNOSIS — Z8249 Family history of ischemic heart disease and other diseases of the circulatory system: Secondary | ICD-10-CM | POA: Diagnosis not present

## 2018-01-13 DIAGNOSIS — D696 Thrombocytopenia, unspecified: Secondary | ICD-10-CM | POA: Diagnosis present

## 2018-01-13 DIAGNOSIS — E876 Hypokalemia: Secondary | ICD-10-CM | POA: Diagnosis present

## 2018-01-13 DIAGNOSIS — J9600 Acute respiratory failure, unspecified whether with hypoxia or hypercapnia: Secondary | ICD-10-CM | POA: Diagnosis present

## 2018-01-13 DIAGNOSIS — A419 Sepsis, unspecified organism: Principal | ICD-10-CM | POA: Diagnosis present

## 2018-01-13 DIAGNOSIS — G35 Multiple sclerosis: Secondary | ICD-10-CM | POA: Diagnosis present

## 2018-01-13 DIAGNOSIS — J181 Lobar pneumonia, unspecified organism: Secondary | ICD-10-CM

## 2018-01-13 DIAGNOSIS — R05 Cough: Secondary | ICD-10-CM

## 2018-01-13 LAB — CBC WITH DIFFERENTIAL/PLATELET
Abs Immature Granulocytes: 0 10*3/uL (ref 0.00–0.07)
Band Neutrophils: 11 %
Basophils Absolute: 0 10*3/uL (ref 0.0–0.1)
Basophils Relative: 0 %
Eosinophils Absolute: 0 10*3/uL (ref 0.0–0.5)
Eosinophils Relative: 0 %
HCT: 37.3 % (ref 36.0–46.0)
HEMOGLOBIN: 11.5 g/dL — AB (ref 12.0–15.0)
LYMPHS ABS: 0.6 10*3/uL — AB (ref 0.7–4.0)
Lymphocytes Relative: 4 %
MCH: 26.9 pg (ref 26.0–34.0)
MCHC: 30.8 g/dL (ref 30.0–36.0)
MCV: 87.4 fL (ref 80.0–100.0)
Monocytes Absolute: 0.3 10*3/uL (ref 0.1–1.0)
Monocytes Relative: 2 %
Neutro Abs: 14.1 10*3/uL — ABNORMAL HIGH (ref 1.7–7.7)
Neutrophils Relative %: 83 %
Platelets: 135 10*3/uL — ABNORMAL LOW (ref 150–400)
RBC: 4.27 MIL/uL (ref 3.87–5.11)
RDW: 20.5 % — ABNORMAL HIGH (ref 11.5–15.5)
WBC: 15 10*3/uL — ABNORMAL HIGH (ref 4.0–10.5)
nRBC: 0 % (ref 0.0–0.2)
nRBC: 0 /100 WBC

## 2018-01-13 LAB — RESPIRATORY PANEL BY PCR

## 2018-01-13 LAB — MAGNESIUM
Magnesium: 2.1 mg/dL (ref 1.7–2.4)
Magnesium: 1.7 mg/dL (ref 1.7–2.4)

## 2018-01-13 LAB — HEMOGLOBIN A1C
Hgb A1c MFr Bld: 5.1 % (ref 4.8–5.6)
Mean Plasma Glucose: 99.67 mg/dL

## 2018-01-13 LAB — BASIC METABOLIC PANEL
Anion gap: 16 — ABNORMAL HIGH (ref 5–15)
BUN: <5 mg/dL — ABNORMAL LOW (ref 6–20)
CO2: 28 mmol/L (ref 22–32)
Calcium: 8.8 mg/dL — ABNORMAL LOW (ref 8.9–10.3)
Chloride: 97 mmol/L — ABNORMAL LOW (ref 98–111)
Creatinine, Ser: 0.5 mg/dL (ref 0.44–1.00)
GFR calc Af Amer: >60 mL/min (ref >60)
GFR calc non Af Amer: >60 mL/min (ref >60)
GLUCOSE: 153 mg/dL — AB (ref 70–99)
Potassium: 3.3 mmol/L — ABNORMAL LOW (ref 3.5–5.1)
Sodium: 141 mmol/L (ref 135–145)

## 2018-01-13 LAB — PROCALCITONIN

## 2018-01-13 LAB — I-STAT CG4 LACTIC ACID, ED
Lactic Acid, Venous: 6.99 mmol/L (ref 0.5–1.9)
Lactic Acid, Venous: 6.11 mmol/L (ref 0.5–1.9)

## 2018-01-13 LAB — LIPASE, BLOOD: Lipase: 28 U/L (ref 11–51)

## 2018-01-13 LAB — ETHANOL: Alcohol, Ethyl (B): 256 mg/dL — ABNORMAL HIGH (ref <10)

## 2018-01-13 LAB — GLUCOSE, CAPILLARY
Glucose-Capillary: 150 mg/dL — ABNORMAL HIGH (ref 70–99)
Glucose-Capillary: 126 mg/dL — ABNORMAL HIGH (ref 70–99)
Glucose-Capillary: 118 mg/dL — ABNORMAL HIGH (ref 70–99)

## 2018-01-13 LAB — INFLUENZA PANEL BY PCR (TYPE A & B)
Influenza A By PCR: NEGATIVE
Influenza B By PCR: NEGATIVE

## 2018-01-13 LAB — MRSA PCR SCREENING: MRSA BY PCR: NEGATIVE

## 2018-01-13 LAB — PROTIME-INR
INR: 1.32
Prothrombin Time: 16.2 seconds — ABNORMAL HIGH (ref 11.4–15.2)

## 2018-01-13 LAB — PHOSPHORUS: PHOSPHORUS: 2.8 mg/dL (ref 2.5–4.6)

## 2018-01-13 MED ORDER — FLUOXETINE HCL 20 MG PO CAPS
20.0000 mg | ORAL_CAPSULE | Freq: Every day | ORAL | Status: DC
  Administered 2018-01-13 – 2018-01-20 (×8): 20 mg via ORAL
  Filled 2018-01-13 (×8): qty 1

## 2018-01-13 MED ORDER — FOLIC ACID 1 MG PO TABS
1.0000 mg | ORAL_TABLET | Freq: Every day | ORAL | Status: DC
  Administered 2018-01-14 – 2018-01-20 (×7): 1 mg via ORAL
  Filled 2018-01-13 (×7): qty 1

## 2018-01-13 MED ORDER — PANTOPRAZOLE SODIUM 40 MG PO TBEC
40.0000 mg | DELAYED_RELEASE_TABLET | Freq: Every day | ORAL | Status: DC
  Administered 2018-01-13 – 2018-01-20 (×8): 40 mg via ORAL
  Filled 2018-01-13 (×8): qty 1

## 2018-01-13 MED ORDER — LACTATED RINGERS IV SOLN
INTRAVENOUS | Status: DC
  Administered 2018-01-13 – 2018-01-14 (×3): via INTRAVENOUS

## 2018-01-13 MED ORDER — NICOTINE 14 MG/24HR TD PT24
14.0000 mg | MEDICATED_PATCH | Freq: Every day | TRANSDERMAL | Status: DC
  Administered 2018-01-13 – 2018-01-20 (×8): 14 mg via TRANSDERMAL
  Filled 2018-01-13 (×8): qty 1

## 2018-01-13 MED ORDER — DEXMEDETOMIDINE HCL IN NACL 200 MCG/50ML IV SOLN
0.2000 ug/kg/h | INTRAVENOUS | Status: DC
  Administered 2018-01-13 (×2): 0.2 ug/kg/h via INTRAVENOUS
  Filled 2018-01-13 (×2): qty 50

## 2018-01-13 MED ORDER — POTASSIUM CHLORIDE CRYS ER 20 MEQ PO TBCR
40.0000 meq | EXTENDED_RELEASE_TABLET | Freq: Once | ORAL | Status: AC
  Administered 2018-01-13: 40 meq via ORAL
  Filled 2018-01-13: qty 2

## 2018-01-13 MED ORDER — INSULIN ASPART 100 UNIT/ML ~~LOC~~ SOLN
0.0000 [IU] | SUBCUTANEOUS | Status: DC
  Administered 2018-01-13 – 2018-01-14 (×3): 2 [IU] via SUBCUTANEOUS
  Administered 2018-01-15: 3 [IU] via SUBCUTANEOUS
  Administered 2018-01-15 – 2018-01-17 (×6): 2 [IU] via SUBCUTANEOUS
  Administered 2018-01-18: 5 [IU] via SUBCUTANEOUS

## 2018-01-13 MED ORDER — DEXMEDETOMIDINE HCL IN NACL 400 MCG/100ML IV SOLN
0.2000 ug/kg/h | INTRAVENOUS | Status: DC
  Administered 2018-01-13: 0.5 ug/kg/h via INTRAVENOUS
  Administered 2018-01-14: 0.2 ug/kg/h via INTRAVENOUS
  Filled 2018-01-13: qty 100

## 2018-01-13 MED ORDER — LORAZEPAM 2 MG/ML IJ SOLN
1.0000 mg | INTRAMUSCULAR | Status: DC | PRN
  Administered 2018-01-13 – 2018-01-14 (×4): 1 mg via INTRAVENOUS
  Administered 2018-01-15 – 2018-01-16 (×8): 2 mg via INTRAVENOUS
  Filled 2018-01-13 (×12): qty 1

## 2018-01-13 MED ORDER — TOPIRAMATE 25 MG PO TABS
50.0000 mg | ORAL_TABLET | Freq: Every day | ORAL | Status: DC
  Administered 2018-01-14 – 2018-01-16 (×3): 50 mg via ORAL
  Filled 2018-01-13 (×3): qty 2

## 2018-01-13 MED ORDER — ALBUTEROL (5 MG/ML) CONTINUOUS INHALATION SOLN
10.0000 mg/h | INHALATION_SOLUTION | Freq: Once | RESPIRATORY_TRACT | Status: AC
  Administered 2018-01-13: 10 mg/h via RESPIRATORY_TRACT
  Filled 2018-01-13: qty 20

## 2018-01-13 MED ORDER — DEXMEDETOMIDINE HCL IN NACL 400 MCG/100ML IV SOLN
0.2000 ug/kg/h | INTRAVENOUS | Status: DC
  Administered 2018-01-13: 0.7 ug/kg/h via INTRAVENOUS
  Filled 2018-01-13: qty 100

## 2018-01-13 MED ORDER — SODIUM CHLORIDE 0.9 % IV BOLUS
2500.0000 mL | Freq: Once | INTRAVENOUS | Status: AC
  Administered 2018-01-13: 2500 mL via INTRAVENOUS

## 2018-01-13 MED ORDER — METHYLPREDNISOLONE SODIUM SUCC 40 MG IJ SOLR
40.0000 mg | Freq: Two times a day (BID) | INTRAMUSCULAR | Status: DC
  Administered 2018-01-14 – 2018-01-17 (×7): 40 mg via INTRAVENOUS
  Filled 2018-01-13 (×7): qty 1

## 2018-01-13 MED ORDER — LORAZEPAM 2 MG/ML IJ SOLN
1.0000 mg | Freq: Once | INTRAMUSCULAR | Status: AC
  Administered 2018-01-13: 1 mg via INTRAVENOUS

## 2018-01-13 MED ORDER — HEPARIN SODIUM (PORCINE) 5000 UNIT/ML IJ SOLN
5000.0000 [IU] | Freq: Three times a day (TID) | INTRAMUSCULAR | Status: DC
  Administered 2018-01-13 – 2018-01-20 (×20): 5000 [IU] via SUBCUTANEOUS
  Filled 2018-01-13 (×20): qty 1

## 2018-01-13 MED ORDER — PROCHLORPERAZINE EDISYLATE 10 MG/2ML IJ SOLN
10.0000 mg | Freq: Once | INTRAMUSCULAR | Status: AC
  Administered 2018-01-13: 10 mg via INTRAVENOUS
  Filled 2018-01-13: qty 2

## 2018-01-13 MED ORDER — SODIUM CHLORIDE 0.9 % IV SOLN
500.0000 mg | INTRAVENOUS | Status: DC
  Administered 2018-01-13 – 2018-01-14 (×2): 500 mg via INTRAVENOUS
  Filled 2018-01-13 (×3): qty 500

## 2018-01-13 MED ORDER — SODIUM CHLORIDE 0.9 % IV SOLN
2.0000 g | INTRAVENOUS | Status: DC
  Administered 2018-01-13 – 2018-01-14 (×2): 2 g via INTRAVENOUS
  Filled 2018-01-13 (×3): qty 20

## 2018-01-13 MED ORDER — POTASSIUM CHLORIDE CRYS ER 20 MEQ PO TBCR: 40.0000 meq | EXTENDED_RELEASE_TABLET | Freq: Once | ORAL | Status: DC

## 2018-01-13 MED ORDER — LORAZEPAM 2 MG/ML IJ SOLN
2.0000 mg | Freq: Once | INTRAMUSCULAR | Status: DC
  Filled 2018-01-13: qty 1

## 2018-01-13 MED ORDER — ONDANSETRON 4 MG PO TBDP: 4.0000 mg | ORAL_TABLET | Freq: Three times a day (TID) | ORAL | Status: DC | PRN

## 2018-01-13 MED ORDER — VITAMIN B-1 100 MG PO TABS
100.0000 mg | ORAL_TABLET | Freq: Every day | ORAL | Status: DC
  Administered 2018-01-14 – 2018-01-20 (×7): 100 mg via ORAL
  Filled 2018-01-13 (×7): qty 1

## 2018-01-13 MED ORDER — ALBUTEROL SULFATE (2.5 MG/3ML) 0.083% IN NEBU
2.5000 mg | INHALATION_SOLUTION | RESPIRATORY_TRACT | Status: DC | PRN
  Administered 2018-01-13 – 2018-01-19 (×2): 2.5 mg via RESPIRATORY_TRACT
  Filled 2018-01-13 (×2): qty 3

## 2018-01-13 MED ORDER — IPRATROPIUM-ALBUTEROL 0.5-2.5 (3) MG/3ML IN SOLN
3.0000 mL | Freq: Four times a day (QID) | RESPIRATORY_TRACT | Status: DC
  Administered 2018-01-13 – 2018-01-19 (×21): 3 mL via RESPIRATORY_TRACT
  Filled 2018-01-13 (×22): qty 3

## 2018-01-13 MED ORDER — GABAPENTIN 100 MG PO CAPS
200.0000 mg | ORAL_CAPSULE | Freq: Three times a day (TID) | ORAL | Status: DC
  Administered 2018-01-14 – 2018-01-20 (×19): 200 mg via ORAL
  Filled 2018-01-13 (×19): qty 2

## 2018-01-13 NOTE — ED Notes (Signed)
Pt. Stats "I'm feeling tightness in my chest", RN notified

## 2018-01-13 NOTE — ED Notes (Addendum)
Pt c/o being unable to breathe, has been on 4L o2, pulse ox replaced and pt observed to be at 82% on 4L, bumped to 6L 02 and calmed using therapeutic communication, now at 96%. Critical care NP called and informed, also informed that pt is wanting to leave. At this time pt is all right with staying

## 2018-01-13 NOTE — ED Provider Notes (Signed)
[facility] EMERGENCY DEPARTMENT Provider Note   CSN: [id number] Arrival date & time: 01/13/18  4098     History   Chief Complaint Chief Complaint  Patient presents with  . Migraine  . Shortness of Breath    HPI Anne Black is a 56 y.o. female.  Complains of shortness of breath onset l 3 days ago typical of her COPD she had in the past accompanied by coughing up green sputum.  She denies any fever.  She also complains of typical migraine headache onset last night, gradual onset.  She gets headaches approximately 2-3 times per week.  Denies falls or trauma associated symptoms include nausea.  No vomiting.  No treatment prior for migraine to coming here.  EMS treated patient with DuoNeb and's intravenous Solu-Medrol with slight relief of dyspnea last drank alcohol last night  HPI  Past Medical History:  Diagnosis Date  . ADHD (attention deficit hyperactivity disorder)   . Alcoholism (HCC)   . Anxiety   . Arthritis   . Ascites   . Bipolar disorder (HCC)   . Cirrhosis (HCC)   . COPD (chronic obstructive pulmonary disease) (HCC)   . Hypokalemia   . Migraine   . Multiple sclerosis (HCC)   . Narcolepsy   . Osteoporosis     Patient Active Problem List   Diagnosis Date Noted  . Weakness 08/21/2017  . COPD exacerbation (HCC) 06/12/2017  . Chest pain with moderate risk for cardiac etiology 05/30/2017  . Abnormal LFTs 05/30/2017  . Tachycardia 05/30/2017  . COPD (chronic obstructive pulmonary disease) (HCC) 04/19/2017  . Right rib fracture 04/15/2017  . Abdominal wall cellulitis 12/30/2016  . S/P exploratory laparotomy 12/30/2016  . Distal radius fracture, left 09/09/2016  . Thrombocytopenia (HCC) 09/09/2016  . Chest x-ray abnormality   . Umbilical hernia without obstruction and without gangrene 05/12/2016  . Itching 02/24/2016  . Ventral hernia without obstruction or gangrene 02/24/2016  . Palliative care encounter   . Ascites   . Tachypnea   .  Atrial flutter with rapid ventricular response (HCC)   . Hypomagnesemia   . Hypoxia   . Dehydration 01/22/2016  . Low back ache 12/30/2015  . Non-intractable vomiting with nausea   . Alcohol abuse with alcohol-induced mood disorder (HCC) 12/15/2015  . Acute alcoholism (HCC)   . Abdominal distension 11/04/2015  . Alcohol abuse   . Dyspnea   . Acute respiratory failure (HCC) 10/15/2015  . Ascites due to alcoholic cirrhosis (HCC) 10/15/2015  . Osteoporosis   . Bipolar I disorder (HCC)   . Multiple sclerosis (HCC)   . Alcoholic cirrhosis of liver with ascites (HCC) 08/22/2015  . Bipolar disorder, current episode mixed, moderate (HCC) 08/01/2015  . Alcohol use disorder, severe, dependence (HCC) 07/31/2015  . Macrocytic anemia- due to alcohol abuse with normal B12 & folate levels 05/31/2015  . Severe protein-calorie malnutrition (HCC) 05/31/2015  . Hypokalemia 05/26/2015  . Encephalopathy, hepatic (HCC) 05/26/2015  . Alcohol withdrawal (HCC) 05/18/2015  . Abdominal pain 05/18/2015  . Anxiety 05/18/2015  . UTI (lower urinary tract infection) 05/18/2015  . Stimulant abuse (HCC) 12/27/2014  . Nicotine abuse 12/27/2014  . Hyperprolactinemia (HCC) 11/15/2014  . Dyslipidemia   . Tobacco abuse 01/23/2014  . Noncompliance with therapeutic plan 04/04/2013  . Hereditary and idiopathic peripheral neuropathy 05/01/2012  . GERD (gastroesophageal reflux disease) 05/01/2012  . Osteoarthrosis, unspecified whether generalized or localized, involving lower leg 05/01/2012  . Hyponatremia 07/01/2011    Past Surgical History:  Procedure Laterality Date  . ABDOMINAL WALL DEFECT REPAIR N/A 12/30/2016   Procedure: EXPLORATORY LAPAROTOMY WITH REPAIR ABDOMINAL WALL VENTRAL HERNIA;  Surgeon: [name], MD;  Location: [facility] OR;  Service: General;  Laterality: N/A;  . APPLICATION OF WOUND VAC N/A 12/30/2016   Procedure: APPLICATION OF WOUND VAC;  Surgeon: [name], MD;  Location: [facility] OR;   Service: General;  Laterality: N/A;  . CESAREAN SECTION  [phone]  . FRACTURE SURGERY    . HERNIA REPAIR    . MYRINGOTOMY WITH TUBE PLACEMENT Bilateral   . ORIF WRIST FRACTURE Left 09/09/2016   Procedure: OPEN REDUCTION INTERNAL FIXATION (ORIF) LEFT WRIST FRACTURE, LEFT CARPAL TUNNEL RELEASE;  Surgeon: Dominica Severin, MD;  Location: [facility] ORS;  Service: Orthopedics;  Laterality: Left;  . TONSILLECTOMY       OB History   None      Home Medications    Prior to Admission medications   Medication Sig Start Date End Date Taking? Authorizing Provider  aluminum-magnesium hydroxide-simethicone (MAALOX) 200-200-20 MG/5ML SUSP Take 30 mLs by mouth 4 (four) times daily -  before meals and at bedtime.    [provider]  benzonatate (TESSALON) 100 MG capsule Take 100 mg by mouth 2 (two) times daily. 12/16/17   [provider]  bismuth subsalicylate (PEPTO-BISMOL) 262 MG chewable tablet Chew 2 tablets (524 mg total) by mouth as needed for diarrhea or loose stools. Patient taking differently: Chew 786 mg by mouth daily as needed for indigestion or diarrhea or loose stools.  07/08/17   Molpus, John, MD  FLUoxetine (PROZAC) 20 MG capsule Take 20 mg by mouth daily. 12/31/17   [provider]  Fluticasone-Salmeterol (ADVAIR) 250-50 MCG/DOSE AEPB Inhale 2 puffs into the lungs 2 (two) times daily. 11/30/17   [provider]  furosemide (LASIX) 40 MG tablet Take 1 tablet (40 mg total) by mouth daily. 06/17/16   Midge Minium, MD  gabapentin (NEURONTIN) 100 MG capsule Take 2 capsules (200 mg total) by mouth 3 (three) times daily. 08/31/17   Rolan Bucco, MD  hydrocortisone cream 1 % Apply topically 3 (three) times daily. Patient taking differently: Apply 1 application topically daily as needed for itching.  04/20/17   Sheikh, Omair Latif, DO  nicotine (NICODERM CQ - DOSED IN MG/24 HOURS) 14 mg/24hr patch Place 1 patch onto the skin daily. 12/19/17   [provider]    ondansetron (ZOFRAN-ODT) 4 MG disintegrating tablet Take 4 mg by mouth 3 (three) times daily as needed for nausea or vomiting.  11/27/17   [provider]  pantoprazole (PROTONIX) 40 MG tablet Take 40 mg by mouth daily.    [provider]  potassium chloride SA (K-DUR,KLOR-CON) 20 MEQ tablet Take 1 tablet (20 mEq total) by mouth daily for 5 doses. Patient taking differently: Take 20 mEq by mouth daily as needed (muscle cramps).  08/28/17 01/10/18  Maczis, Elmer Sow, PA-C  potassium chloride SA (K-DUR,KLOR-CON) 20 MEQ tablet Take 1 tablet (20 mEq total) by mouth daily. 01/10/18   Cathren Laine, MD  promethazine (PHENERGAN) 25 MG tablet Take 25 mg by mouth every 6 (six) hours as needed for nausea or vomiting.    [provider]  risperiDONE (RISPERDAL) 0.5 MG tablet Take 1 tablet (0.5 mg total) by mouth 3 (three) times daily as needed (agitation). 02/06/16   Renne Musca, MD  rizatriptan (MAXALT) 10 MG tablet Take 10mg  by mouth every 2 hours as needed for migraine.  May repeat in 2 hours  and max 24 hour dose is 30mg . Patient not taking: Reported on 01/10/2018 08/23/17   Leatha Gilding, MD  rizatriptan (MAXALT) 5 MG tablet Take 5 mg by mouth every 2 (two) hours as needed for migraine.  12/31/17   [provider]  spironolactone (ALDACTONE) 25 MG tablet Take 25 mg by mouth daily. 12/10/17   [provider]  topiramate (TOPAMAX) 50 MG tablet Take 50 mg by mouth at bedtime.     [provider]  traZODone (DESYREL) 50 MG tablet Take 50 mg by mouth at bedtime as needed for sleep.  12/31/17   [provider]  VENTOLIN HFA 108 (90 Base) MCG/ACT inhaler Inhale 2 puffs into the lungs every 4 (four) hours as needed for wheezing or shortness of breath.  11/21/17   [provider]    Family History Family History  Problem Relation Age of Onset  . Arrhythmia Mother   . Heart disease Father   . Hypertension Father     Social  History Social History   Tobacco Use  . Smoking status: Current Every Day Smoker    Packs/day: 1.00    Years: 35.00    Pack years: 35.00    Types: Cigarettes  . Smokeless tobacco: Never Used  Substance Use Topics  . Alcohol use: Not Currently  . Drug use: No  Heavy alcohol.  Last used alcohol last night.   Allergies   Patient has no known allergies.   Review of Systems Review of Systems  Constitutional: Negative.   HENT: Negative.   Respiratory: Positive for cough and shortness of breath.   Cardiovascular: Negative.   Gastrointestinal: Positive for nausea.  Musculoskeletal: Positive for gait problem.       Uses wheelchair, cane or walker  Skin: Negative.   Neurological: Positive for headaches.  Psychiatric/Behavioral: Negative.   All other systems reviewed and are negative.    Physical Exam Updated Vital Signs There were no vitals taken for this visit.  Physical Exam  Constitutional: She is oriented to person, place, and time.  Chronically ill-appearing  HENT:  Head: Normocephalic and atraumatic.  Eyes: Pupils are equal, round, and reactive to light. Conjunctivae are normal.  Neck: Neck supple. No tracheal deviation present. No thyromegaly present.  Cardiovascular: Regular rhythm.  No murmur heard. Tachycardic  Pulmonary/Chest:  Weeks in paragraphs.  No respiratory distress.  Expiratory wheezes  Abdominal: Soft. Bowel sounds are normal. She exhibits no distension. There is no tenderness.  Musculoskeletal: Normal range of motion. She exhibits no edema or tenderness.  Neurological: She is alert and oriented to person, place, and time. No cranial nerve deficit.  Moves all extremities cranial nerves II through XII grossly intact  Skin: Skin is warm and dry. Capillary refill takes less than 2 seconds. No rash noted.  Psychiatric: She has a normal mood and affect.  Nursing note and vitals reviewed.    ED Treatments / Results  Labs (all labs ordered are  listed, but only abnormal results are displayed) Labs Reviewed - No data to display  EKG None  Radiology No results found.  Procedures Procedures (including critical care time)  Medications Ordered in ED Medications - No data to display  Chest x-ray viewed by me Pulse oximetry consistent with hypoxia Results for orders placed or performed during the hospital encounter of 01/13/18  Basic metabolic panel  Result Value Ref Range   Sodium 141 135 - 145 mmol/L   Potassium 3.3 (L) 3.5 - 5.1 mmol/L  Chloride 97 (L) 98 - 111 mmol/L   CO2 28 22 - 32 mmol/L   Glucose, Bld 153 (H) 70 - 99 mg/dL   BUN <5 (L) 6 - 20 mg/dL   Creatinine, Ser 8.29 0.44 - 1.00 mg/dL   Calcium 8.8 (L) 8.9 - 10.3 mg/dL   GFR calc non Af Amer >60 >60 mL/min   GFR calc Af Amer >60 >60 mL/min   Anion gap 16 (H) 5 - 15  CBC with Differential/Platelet  Result Value Ref Range   WBC 15.0 (H) 4.0 - 10.5 K/uL   RBC 4.27 3.87 - 5.11 MIL/uL   Hemoglobin 11.5 (L) 12.0 - 15.0 g/dL   HCT 56.2 13.0 - 86.5 %   MCV 87.4 80.0 - 100.0 fL   MCH 26.9 26.0 - 34.0 pg   MCHC 30.8 30.0 - 36.0 g/dL   RDW 78.4 (H) 69.6 - 29.5 %   Platelets 135 (L) 150 - 400 K/uL   nRBC 0.0 0.0 - 0.2 %   Neutrophils Relative % 83 %   Neutro Abs 14.1 (H) 1.7 - 7.7 K/uL   Band Neutrophils 11 %   Lymphocytes Relative 4 %   Lymphs Abs 0.6 (L) 0.7 - 4.0 K/uL   Monocytes Relative 2 %   Monocytes Absolute 0.3 0.1 - 1.0 K/uL   Eosinophils Relative 0 %   Eosinophils Absolute 0.0 0.0 - 0.5 K/uL   Basophils Relative 0 %   Basophils Absolute 0.0 0.0 - 0.1 K/uL   nRBC 0 0 /100 WBC   Abs Immature Granulocytes 0.00 0.00 - 0.07 K/uL   Polychromasia PRESENT   Magnesium  Result Value Ref Range   Magnesium 2.1 1.7 - 2.4 mg/dL  Ethanol  Result Value Ref Range   Alcohol, Ethyl (B) 256 (H) <10 mg/dL  I-Stat CG4 Lactic Acid, ED  Result Value Ref Range   Lactic Acid, Venous 6.99 (HH) 0.5 - 1.9 mmol/L   Comment NOTIFIED PHYSICIAN    Dg Chest Port 1  View  Result Date: 01/13/2018 CLINICAL DATA:  Headache, shortness of breath and productive cough. EXAM: PORTABLE CHEST 1 VIEW COMPARISON:  08/28/2017, chest CT FINDINGS: Cardiomediastinal silhouette is normal. Mediastinal contours appear intact. There is no pleural effusion or pneumothorax. Peribronchial airspace consolidation in the left lower thorax. Osseous structures are without acute abnormality. Soft tissues are grossly normal. Breast implants noted. IMPRESSION: Peribronchial airspace consolidation the left lower thorax may represent atelectasis or early infectious consolidation. Electronically Signed   By: Ted Mcalpine M.D.   On: 01/13/2018 09:56  Chest x-ray viewed by me Initial Impression / Assessment and Plan / ED Course  I have reviewed the triage vital signs and the nursing notes.  Pertinent labs & imaging results that were available during my care of the patient were reviewed by me and considered in my medical decision making (see chart for details).     Sepsis called based on sirs criteria of tachycardia, tachypnea,  Source of infection felt to be respiratory.  30 mL/kg IV bolus normal saline ordered based on lactic acidosis. Results for orders placed or performed during the hospital encounter of 01/13/18  Basic metabolic panel  Result Value Ref Range   Sodium 141 135 - 145 mmol/L   Potassium 3.3 (L) 3.5 - 5.1 mmol/L   Chloride 97 (L) 98 - 111 mmol/L   CO2 28 22 - 32 mmol/L   Glucose, Bld 153 (H) 70 - 99 mg/dL   BUN <5 (L) 6 - 20  mg/dL   Creatinine, Ser 1.61 0.44 - 1.00 mg/dL   Calcium 8.8 (L) 8.9 - 10.3 mg/dL   GFR calc non Af Amer >60 >60 mL/min   GFR calc Af Amer >60 >60 mL/min   Anion gap 16 (H) 5 - 15  CBC with Differential/Platelet  Result Value Ref Range   WBC 15.0 (H) 4.0 - 10.5 K/uL   RBC 4.27 3.87 - 5.11 MIL/uL   Hemoglobin 11.5 (L) 12.0 - 15.0 g/dL   HCT 09.6 04.5 - 40.9 %   MCV 87.4 80.0 - 100.0 fL   MCH 26.9 26.0 - 34.0 pg   MCHC 30.8 30.0 - 36.0  g/dL   RDW 81.1 (H) 91.4 - 78.2 %   Platelets 135 (L) 150 - 400 K/uL   nRBC 0.0 0.0 - 0.2 %   Neutrophils Relative % 83 %   Neutro Abs 14.1 (H) 1.7 - 7.7 K/uL   Band Neutrophils 11 %   Lymphocytes Relative 4 %   Lymphs Abs 0.6 (L) 0.7 - 4.0 K/uL   Monocytes Relative 2 %   Monocytes Absolute 0.3 0.1 - 1.0 K/uL   Eosinophils Relative 0 %   Eosinophils Absolute 0.0 0.0 - 0.5 K/uL   Basophils Relative 0 %   Basophils Absolute 0.0 0.0 - 0.1 K/uL   nRBC 0 0 /100 WBC   Abs Immature Granulocytes 0.00 0.00 - 0.07 K/uL   Polychromasia PRESENT   Influenza panel by PCR (type A & B)  Result Value Ref Range   Influenza A By PCR NEGATIVE NEGATIVE   Influenza B By PCR NEGATIVE NEGATIVE  Magnesium  Result Value Ref Range   Magnesium 2.1 1.7 - 2.4 mg/dL  Ethanol  Result Value Ref Range   Alcohol, Ethyl (B) 256 (H) <10 mg/dL  I-Stat CG4 Lactic Acid, ED  Result Value Ref Range   Lactic Acid, Venous 6.99 (HH) 0.5 - 1.9 mmol/L   Comment NOTIFIED PHYSICIAN   I-Stat CG4 Lactic Acid, ED  Result Value Ref Range   Lactic Acid, Venous 6.11 (HH) 0.5 - 1.9 mmol/L   Comment NOTIFIED PHYSICIAN    Dg Chest Port 1 View  Result Date: 01/13/2018 CLINICAL DATA:  Headache, shortness of breath and productive cough. EXAM: PORTABLE CHEST 1 VIEW COMPARISON:  08/28/2017, chest CT FINDINGS: Cardiomediastinal silhouette is normal. Mediastinal contours appear intact. There is no pleural effusion or pneumothorax. Peribronchial airspace consolidation in the left lower thorax. Osseous structures are without acute abnormality. Soft tissues are grossly normal. Breast implants noted. IMPRESSION: Peribronchial airspace consolidation the left lower thorax may represent atelectasis or early infectious consolidation. Electronically Signed   By: Ted Mcalpine M.D.   On: 01/13/2018 09:56  10 AM headache not improved after treatment with intravenous Compazine Sepsis - Repeat Assessment  Performed  at:    220pm  Vitals     Blood pressure (!) 151/56, pulse (!) 123, temperature 98.3 F (36.8 C), temperature source Oral, resp. rate (!) 36, height 5\' 2"  (1.575 m), weight 81.6 kg, SpO2 92 %.  Heart:     Tachycardic  Lungs:    Rhonchi  Capillary Refill:   <2 sec  Peripheral Pulse:   Radial pulse palpable  Skin:     Normal Color   Final Clinical Impressions(s) / ED Diagnoses  Lab work consistent with hypokalemia, leukocytosis, anion gap acidosis.  Anion gap acidosis secondary to sepsis versus alcoholic ketoacidosis, versus alcohol intoxication lab work is also consistent with acute alcohol intoxication.  Chest x-ray  viewed by me Final diagnoses:  None  Critical care called to evaluate patient.  Lactate has not adequately cleared after approximately 30 mL/kg intravenous fluid bolus patient also received intravenous antibiotics and oral potassium supplementation Patient will be admitted to the intensive care unit.  Patient should be watched closely for alcohol withdrawal ED Discharge Orders    None    #1 severe sepsis #2 community-acquired pneumonia #3 anion gap acidosis #4 acute alcohol intoxication #5 hypokalemia #6 tobacco abuse  CRITICAL CARE Performed by: Doug Sou Total critical care time: 60 minutes Critical care time was exclusive of separately billable procedures and treating other patients. Critical care was necessary to treat or prevent imminent or life-threatening deterioration. Critical care was time spent personally by me on the following activities: development of treatment plan with patient and/or surrogate as well as nursing, discussions with consultants, evaluation of patient's response to treatment, examination of patient, obtaining history from patient or surrogate, ordering and performing treatments and interventions, ordering and review of laboratory studies, ordering and review of radiographic studies, pulse oximetry and re-evaluation of patient's  condition.   Doug Sou, MD 01/13/18 [phone]

## 2018-01-13 NOTE — ED Notes (Signed)
PAGED CRTCARE TO JAMIE, RN

## 2018-01-13 NOTE — ED Triage Notes (Signed)
Arrives from home, beginning last night c/o headache and SOB w productive cough and burning with urination  [facility] VS 102 HR, 148/70, 95% RA before tx up to 100%  Received 125 solumedrol, 10 abuderol, .5 atrovent, 4 zofran Per EMS ETOH on board, last drink last night

## 2018-01-13 NOTE — ED Notes (Signed)
Pt concerned about breathing but unwilling to wear pulse oximeter, reeducated about pulse oximeter and reassured that oxygenation is all right at this time while on 02

## 2018-01-13 NOTE — H&P (Signed)
NAME:  Anne Black, MRN:  [id number], DOB:  06-21-1960, LOS: 0 ADMISSION DATE:  01/13/2018, CONSULTATION DATE:  12/6 REFERRING MD:  Ethelda Chick, CHIEF COMPLAINT:  Acute respiratory failure    Brief History   57 year old female admitted 12/6 with working diagnosis of community-acquired pneumonia, and early alcohol withdrawal plus SIRS/sepsis  History of present illness   57 year old white female with a significant history of daily alcohol use, tobacco abuse, and COPD.  Just finished rehab less than 1 month ago, remained sober for approximately 1 week, now back to drinking daily.  Was in her usual state of health up until 12/5 afternoon when she developed headache, sore throat, wet nonproductive cough, worsening wheezing, chest tightness, and shortness of breath.  This progressed throughout the evening and because of this she presented to the emergency room on 12/6.  In the emergency room she was found to be tachycardic, anxious, sitting in the tripod position with marked accessory use.  Initial chest x-ray demonstrated early left lower lobe infiltrate.  She was anxious and short of breath.  Initial lactic acid was 6.99, she received over 2 L of crystalloid resuscitation and only cleared to 6.11,  she continued to exhibit significant work of breathing, was becoming progressively anxious, and required to sit up in the tripod position to assist with her work of breathing, and required titration up to 6 L nasal cannula and saturations dropped to 82% on 2 L;  because of this pulmonary was asked to evaluate  Past Medical History  Tobacco abuse, COPD, cirrhosis, alcoholism, bipolar disease, ascites  Significant Hospital Events   12/6: Admitted with working diagnosis of CAP, lactic acidosis, sepsis, and early alcohol withdrawal.  Admitting to the intensive care due to high risk of decompensation  Consults:    Procedures:    Significant Diagnostic Tests:    Micro Data:  Blood culture x212/6 Urine  culture 12/6 Urine strep 12/6 Urine Legionella 12/6 Respiratory viral panel 12/6 Influenza panel 12/6: Negative  Antimicrobials:  Rocephin 12/6 Azithromycin 12/6  Interim history/subjective:  Very short of breath and anxious.  Last drink last night.  She is worried because she has not drank since last week  Objective   Blood pressure (Abnormal) 157/98, pulse (Abnormal) 123, temperature 98.3 F (36.8 C), temperature source Oral, resp. rate (Abnormal) 29, height 5\' 2"  (1.575 m), weight 81.6 kg, SpO2 92 %.        Intake/Output Summary (Last 24 hours) at 01/13/2018 1450 Last data filed at 01/13/2018 1339 Gross per 24 hour  Intake 2849.98 ml  Output no documentation  Net 2849.98 ml   Filed Weights   01/13/18 0912  Weight: 81.6 kg    Examination: General: Chronically ill-appearing 57 year old female currently sitting up in the tripod position with accessory use on exam HENT: Normocephalic atraumatic mucous membranes moist no JVD Lungs: Diffuse wheezing, left much greater than right accessory use appreciated in the tripod position Cardiovascular: Tachycardic regular rate and rhythm Abdomen: Soft nontender no organomegaly Extremities: Brisk capillary refill no significant edema strong pulses Neuro: Awake, alert, oriented but extremely anxious GU: Due to void  Resolved Hospital Problem list    Assessment & Plan:  Acute respiratory failure in the setting of early community-acquired pneumonia and acute exacerbation chronic structural pulmonary disease further exacerbated by alcohol withdrawal -Portable chest x-ray personally reviewed this demonstrates early left lower lobe infiltrate Plan Admit to the intensive care Pulse oximetry Supplemental oxygen Scheduled BDs Systemic Steroids Empiric Rocephin and a azithromycin day 1  Follow-up pending cultures Send urine strep and Legionella antigen also respiratory viral panel BiPAP PRN Treat withdrawal  Early alcohol withdrawal  superimposed on history of bipolar disease Plan Admit to the intensive care Initiating Precedex protocol, would prefer to avoid benzodiazepines given history of cirrhosis Continue Neurontin Continue Prozac Thiamine and folate  Sirs/sepsis secondary to pneumonia.  Suspect tachycardia also complicated by early withdrawal Plan Admit to intensive care IV hydration Antibiotics as above  Lactic acidosis/anion gap metabolic acidosis Had minimal clearance, however does have history of cirrhosis.  Ongoing lactic acidosis is most likely secondary to delayed hepatic clearance however work of breathing could also be contributing.  Do not believe that this represents an organ hypoperfusion Plan Continuing supportive care No further lactic acid checks  Fluid and electrolyte imbalance: Hypokalemia Plan Replace recheck  History of cirrhosis Plan Check LFTs  Mild anemia normocytic normochromic Plan Trend CBC   Best practice:  Diet: N.p.o. Pain/Anxiety/Delirium protocol (if indicated): Precedex infusion VAP protocol (if indicated): Not indicated DVT prophylaxis: Subcutaneous heparin GI prophylaxis: PPI Glucose control: Sliding scale insulin Mobility: Out of bed with assist Code Status: Full code Family Communication: Pending Disposition: She is critically ill with high risk of progressive respiratory failure.  Will admit her to the intensive care given concern about need for intubation.  She will need close observation, titration of Precedex infusion, broad-spectrum empiric antibiotics, and ongoing lab trending.  Labs   CBC: Recent Labs  Lab 01/10/18 0736 01/13/18 0950  WBC 8.2 15.0*  NEUTROABS  --  14.1*  HGB 11.5* 11.5*  HCT 36.6 37.3  MCV 87.4 87.4  PLT 104* 135*    Basic Metabolic Panel: Recent Labs  Lab 01/10/18 0736 01/13/18 0950  NA 140 141  K 3.1* 3.3*  CL 99 97*  CO2 29 28  GLUCOSE 117* 153*  BUN <5* <5*  CREATININE 0.33* 0.50  CALCIUM 8.0* 8.8*  MG  --   2.1   GFR: Estimated Creatinine Clearance: 75.9 mL/min (by C-G formula based on SCr of 0.5 mg/dL). Recent Labs  Lab 01/10/18 0736 01/13/18 0950 01/13/18 1210 01/13/18 1356  WBC 8.2 15.0*  --   --   LATICACIDVEN  --   --  6.99* 6.11*    Liver Function Tests: Recent Labs  Lab 01/10/18 0736  AST 26  ALT 13  ALKPHOS 108  BILITOT 1.6*  PROT 6.3*  ALBUMIN 3.6   Recent Labs  Lab 01/10/18 0736  LIPASE 27   No results for input(s): AMMONIA in the last 168 hours.  ABG    Component Value Date/Time   PHART 7.452 (H) 03/07/2017 1841   PCO2ART 42.2 03/07/2017 1841   PO2ART 54.0 (L) 03/07/2017 1841   HCO3 29.6 (H) 03/07/2017 1841   TCO2 31 03/07/2017 1841   ACIDBASEDEF 1.1 12/21/2016 0542   O2SAT 90.0 03/07/2017 1841     Coagulation Profile: Recent Labs  Lab 01/10/18 0736  INR 1.34    Cardiac Enzymes: No results for input(s): CKTOTAL, CKMB, CKMBINDEX, TROPONINI in the last 168 hours.  HbA1C: Hgb A1c MFr Bld  Date/Time Value Ref Range Status  12/30/2016 12:40 PM 5.0 4.8 - 5.6 % Final    Comment:    (NOTE) Pre diabetes:          5.7%-6.4% Diabetes:              >6.4% Glycemic control for   <7.0% adults with diabetes   08/02/2015 06:19 AM 4.9 4.8 - 5.6 % Final  Comment:    (NOTE)         Pre-diabetes: 5.7 - 6.4         Diabetes: >6.4         Glycemic control for adults with diabetes: <7.0     CBG: No results for input(s): GLUCAP in the last 168 hours.  Review of Systems:   Review of Systems - History obtained from the patient General ROS: positive for  - fatigue, malaise and sleep disturbance negative for - fever, hot flashes or night sweats Psychological ROS: positive for - anxiety and irritability ENT ROS: positive for - headaches Allergy and Immunology ROS: negative Hematological and Lymphatic ROS: negative Endocrine ROS: negative Respiratory ROS: positive for - cough, shortness of breath, tachypnea and wheezing negative for - hemoptysis,  orthopnea or pleuritic pain Cardiovascular ROS: negative Gastrointestinal ROS: no abdominal pain, change in bowel habits, or black or bloody stools Genito-Urinary ROS: positive for - dysuria Musculoskeletal ROS: negative Neurological ROS: no TIA or stroke symptoms  Past Medical History  She,  has a past medical history of ADHD (attention deficit hyperactivity disorder), Alcoholism (HCC), Anxiety, Arthritis, Ascites, Bipolar disorder (HCC), Cirrhosis (HCC), COPD (chronic obstructive pulmonary disease) (HCC), Hypokalemia, Migraine, Multiple sclerosis (HCC), Narcolepsy, and Osteoporosis.   Surgical History    Past Surgical History:  Procedure Laterality Date  . ABDOMINAL WALL DEFECT REPAIR N/A 12/30/2016   Procedure: EXPLORATORY LAPAROTOMY WITH REPAIR ABDOMINAL WALL VENTRAL HERNIA;  Surgeon: [name], MD;  Location: [facility] OR;  Service: General;  Laterality: N/A;  . APPLICATION OF WOUND VAC N/A 12/30/2016   Procedure: APPLICATION OF WOUND VAC;  Surgeon: [name], MD;  Location: [facility] OR;  Service: General;  Laterality: N/A;  . CESAREAN SECTION  [phone]  . FRACTURE SURGERY    . HERNIA REPAIR    . MYRINGOTOMY WITH TUBE PLACEMENT Bilateral   . ORIF WRIST FRACTURE Left 09/09/2016   Procedure: OPEN REDUCTION INTERNAL FIXATION (ORIF) LEFT WRIST FRACTURE, LEFT CARPAL TUNNEL RELEASE;  Surgeon: Dominica Severin, MD;  Location: [facility] ORS;  Service: Orthopedics;  Laterality: Left;  . TONSILLECTOMY       Social History   reports that she has been smoking cigarettes. She has a 35.00 pack-year smoking history. She has never used smokeless tobacco. She reports that she drank alcohol. She reports that she does not use drugs.   Family History   Her family history includes Arrhythmia in her mother; Heart disease in her father; Hypertension in her father.   Allergies No Known Allergies   Home Medications  Prior to Admission medications   Medication Sig Start Date End Date Taking? Authorizing  Provider  aluminum-magnesium hydroxide-simethicone (MAALOX) 200-200-20 MG/5ML SUSP Take 30 mLs by mouth 4 (four) times daily -  before meals and at bedtime.    [provider]  benzonatate (TESSALON) 100 MG capsule Take 100 mg by mouth 2 (two) times daily. 12/16/17   [provider]  bismuth subsalicylate (PEPTO-BISMOL) 262 MG chewable tablet Chew 2 tablets (524 mg total) by mouth as needed for diarrhea or loose stools. Patient taking differently: Chew 786 mg by mouth daily as needed for indigestion or diarrhea or loose stools.  07/08/17   Molpus, John, MD  FLUoxetine (PROZAC) 20 MG capsule Take 20 mg by mouth daily. 12/31/17   [provider]  Fluticasone-Salmeterol (ADVAIR) 250-50 MCG/DOSE AEPB Inhale 2 puffs into the lungs 2 (two) times daily. 11/30/17   [provider]  furosemide (LASIX) 40 MG tablet Take 1 tablet (  40 mg total) by mouth daily. 06/17/16   Midge Minium, MD  gabapentin (NEURONTIN) 100 MG capsule Take 2 capsules (200 mg total) by mouth 3 (three) times daily. 08/31/17   Rolan Bucco, MD  hydrocortisone cream 1 % Apply topically 3 (three) times daily. Patient taking differently: Apply 1 application topically daily as needed for itching.  04/20/17   Sheikh, Omair Latif, DO  nicotine (NICODERM CQ - DOSED IN MG/24 HOURS) 14 mg/24hr patch Place 1 patch onto the skin daily. 12/19/17   [provider]  ondansetron (ZOFRAN-ODT) 4 MG disintegrating tablet Take 4 mg by mouth 3 (three) times daily as needed for nausea or vomiting.  11/27/17   [provider]  pantoprazole (PROTONIX) 40 MG tablet Take 40 mg by mouth daily.    [provider]  potassium chloride SA (K-DUR,KLOR-CON) 20 MEQ tablet Take 1 tablet (20 mEq total) by mouth daily for 5 doses. Patient taking differently: Take 20 mEq by mouth daily as needed (muscle cramps).  08/28/17 01/10/18  Maczis, Elmer Sow, PA-C  potassium chloride SA (K-DUR,KLOR-CON) 20 MEQ tablet Take 1  tablet (20 mEq total) by mouth daily. 01/10/18   Cathren Laine, MD  promethazine (PHENERGAN) 25 MG tablet Take 25 mg by mouth every 6 (six) hours as needed for nausea or vomiting.    [provider]  risperiDONE (RISPERDAL) 0.5 MG tablet Take 1 tablet (0.5 mg total) by mouth 3 (three) times daily as needed (agitation). 02/06/16   Renne Musca, MD  rizatriptan (MAXALT) 10 MG tablet Take 10mg  by mouth every 2 hours as needed for migraine.  May repeat in 2 hours and max 24 hour dose is 30mg . Patient not taking: Reported on 01/10/2018 08/23/17   Leatha Gilding, MD  rizatriptan (MAXALT) 5 MG tablet Take 5 mg by mouth every 2 (two) hours as needed for migraine.  12/31/17   [provider]  spironolactone (ALDACTONE) 25 MG tablet Take 25 mg by mouth daily. 12/10/17   [provider]  topiramate (TOPAMAX) 50 MG tablet Take 50 mg by mouth at bedtime.     [provider]  traZODone (DESYREL) 50 MG tablet Take 50 mg by mouth at bedtime as needed for sleep.  12/31/17   [provider]  VENTOLIN HFA 108 (90 Base) MCG/ACT inhaler Inhale 2 puffs into the lungs every 4 (four) hours as needed for wheezing or shortness of breath.  11/21/17   [provider]     Critical care time: 43 min     Simonne Martinet ACNP-BC [facility] Pulmonary/Critical Care Pager # [phone] OR # [phone] if no answer

## 2018-01-13 NOTE — ED Notes (Signed)
Rectal temp. 97.3 

## 2018-01-13 NOTE — ED Notes (Signed)
Pt believes she is in withdrawal. Informed MD, at bedside to explain to pt BAC and plan of care, no orders for CIWA or ativan at this time.

## 2018-01-14 ENCOUNTER — Inpatient Hospital Stay (HOSPITAL_COMMUNITY): Payer: 59

## 2018-01-14 ENCOUNTER — Other Ambulatory Visit: Payer: Self-pay

## 2018-01-14 DIAGNOSIS — G9341 Metabolic encephalopathy: Secondary | ICD-10-CM

## 2018-01-14 DIAGNOSIS — F1020 Alcohol dependence, uncomplicated: Secondary | ICD-10-CM

## 2018-01-14 DIAGNOSIS — F10231 Alcohol dependence with withdrawal delirium: Secondary | ICD-10-CM

## 2018-01-14 LAB — AMMONIA: Ammonia: 76 umol/L — ABNORMAL HIGH (ref 9–35)

## 2018-01-14 LAB — POCT I-STAT 3, ART BLOOD GAS (G3+)
Acid-Base Excess: 9 mmol/L — ABNORMAL HIGH (ref 0.0–2.0)
Acid-Base Excess: 6 mmol/L — ABNORMAL HIGH (ref 0.0–2.0)
Acid-base deficit: 5 mmol/L — ABNORMAL HIGH (ref 0.0–2.0)
Bicarbonate: 18.4 mmol/L — ABNORMAL LOW (ref 20.0–28.0)
Bicarbonate: 35.2 mmol/L — ABNORMAL HIGH (ref 20.0–28.0)
Bicarbonate: 33.3 mmol/L — ABNORMAL HIGH (ref 20.0–28.0)
O2 SAT: 97 %
O2 Saturation: 100 %
O2 Saturation: 99 %
PH ART: 7.39 (ref 7.350–7.450)
Patient temperature: 99
Patient temperature: 99.5
Patient temperature: 98.6
TCO2: 19 mmol/L — ABNORMAL LOW (ref 22–32)
TCO2: 37 mmol/L — ABNORMAL HIGH (ref 22–32)
TCO2: 35 mmol/L — ABNORMAL HIGH (ref 22–32)
pCO2 arterial: 30.6 mmHg — ABNORMAL LOW (ref 32.0–48.0)
pCO2 arterial: 60.1 mmHg — ABNORMAL HIGH (ref 32.0–48.0)
pCO2 arterial: 62.5 mmHg — ABNORMAL HIGH (ref 32.0–48.0)
pH, Arterial: 7.377 (ref 7.350–7.450)
pH, Arterial: 7.335 — ABNORMAL LOW (ref 7.350–7.450)
pO2, Arterial: 148 mmHg — ABNORMAL HIGH (ref 83.0–108.0)
pO2, Arterial: 174 mmHg — ABNORMAL HIGH (ref 83.0–108.0)
pO2, Arterial: 98 mmHg (ref 83.0–108.0)

## 2018-01-14 LAB — GLUCOSE, CAPILLARY
Glucose-Capillary: 109 mg/dL — ABNORMAL HIGH (ref 70–99)
Glucose-Capillary: 110 mg/dL — ABNORMAL HIGH (ref 70–99)
Glucose-Capillary: 112 mg/dL — ABNORMAL HIGH (ref 70–99)
Glucose-Capillary: 112 mg/dL — ABNORMAL HIGH (ref 70–99)
Glucose-Capillary: 112 mg/dL — ABNORMAL HIGH (ref 70–99)
Glucose-Capillary: 127 mg/dL — ABNORMAL HIGH (ref 70–99)

## 2018-01-14 LAB — HIV ANTIBODY (ROUTINE TESTING W REFLEX): HIV Screen 4th Generation wRfx: NONREACTIVE

## 2018-01-14 LAB — CBC
HCT: 32.9 % — ABNORMAL LOW (ref 36.0–46.0)
Hemoglobin: 9.8 g/dL — ABNORMAL LOW (ref 12.0–15.0)
MCH: 26.3 pg (ref 26.0–34.0)
MCHC: 29.8 g/dL — AB (ref 30.0–36.0)
MCV: 88.4 fL (ref 80.0–100.0)
Platelets: 99 10*3/uL — ABNORMAL LOW (ref 150–400)
RBC: 3.72 MIL/uL — ABNORMAL LOW (ref 3.87–5.11)
RDW: 20.4 % — ABNORMAL HIGH (ref 11.5–15.5)
WBC: 9.3 10*3/uL (ref 4.0–10.5)
nRBC: 0 % (ref 0.0–0.2)

## 2018-01-14 LAB — BASIC METABOLIC PANEL
Anion gap: 10 (ref 5–15)
BUN: 7 mg/dL (ref 6–20)
CO2: 28 mmol/L (ref 22–32)
Calcium: 8.5 mg/dL — ABNORMAL LOW (ref 8.9–10.3)
Chloride: 100 mmol/L (ref 98–111)
Creatinine, Ser: 0.47 mg/dL (ref 0.44–1.00)
GFR calc Af Amer: >60 mL/min (ref >60)
GLUCOSE: 135 mg/dL — AB (ref 70–99)
Potassium: 4.2 mmol/L (ref 3.5–5.1)
Sodium: 138 mmol/L (ref 135–145)

## 2018-01-14 LAB — PROCALCITONIN: Procalcitonin: <0.1 ng/mL

## 2018-01-14 LAB — MAGNESIUM: Magnesium: 1.7 mg/dL (ref 1.7–2.4)

## 2018-01-14 LAB — PHOSPHORUS: Phosphorus: 2.5 mg/dL (ref 2.5–4.6)

## 2018-01-14 LAB — STREP PNEUMONIAE URINARY ANTIGEN: Strep Pneumo Urinary Antigen: NEGATIVE

## 2018-01-14 MED ORDER — BUDESONIDE 0.25 MG/2ML IN SUSP
0.2500 mg | Freq: Two times a day (BID) | RESPIRATORY_TRACT | Status: DC
  Administered 2018-01-14 – 2018-01-19 (×11): 0.25 mg via RESPIRATORY_TRACT
  Filled 2018-01-14 (×14): qty 2

## 2018-01-14 MED ORDER — CHLORDIAZEPOXIDE HCL 5 MG PO CAPS
5.0000 mg | ORAL_CAPSULE | Freq: Two times a day (BID) | ORAL | Status: DC
  Administered 2018-01-14 – 2018-01-15 (×2): 5 mg via ORAL
  Filled 2018-01-14 (×3): qty 1

## 2018-01-14 MED ORDER — ARFORMOTEROL TARTRATE 15 MCG/2ML IN NEBU
15.0000 ug | INHALATION_SOLUTION | Freq: Two times a day (BID) | RESPIRATORY_TRACT | Status: DC
  Administered 2018-01-14 – 2018-01-19 (×11): 15 ug via RESPIRATORY_TRACT
  Filled 2018-01-14 (×13): qty 2

## 2018-01-14 MED ORDER — LACTULOSE 10 GM/15ML PO SOLN
20.0000 g | Freq: Two times a day (BID) | ORAL | Status: DC
  Administered 2018-01-14 – 2018-01-17 (×5): 20 g via ORAL
  Filled 2018-01-14 (×8): qty 30

## 2018-01-14 NOTE — Progress Notes (Signed)
NAME:  Anne Black, MRN:  [id number], DOB:  August 19, 1960, LOS: 1 ADMISSION DATE:  01/13/2018, CONSULTATION DATE:  12/6 REFERRING MD:  Ethelda Chick, CHIEF COMPLAINT:  Acute respiratory failure    Brief History   57 year old female admitted 12/6 with working diagnosis of community-acquired pneumonia, and early alcohol withdrawal plus SIRS/sepsis  History of present illness   57 year old white female with a significant history of daily alcohol use, tobacco abuse, and COPD.  Just finished rehab less than 1 month ago, remained sober for approximately 1 week, now back to drinking daily.  Was in her usual state of health up until 12/5 afternoon when she developed headache, sore throat, wet nonproductive cough, worsening wheezing, chest tightness, and shortness of breath.  This progressed throughout the evening and because of this she presented to the emergency room on 12/6.  In the emergency room she was found to be tachycardic, anxious, sitting in the tripod position with marked accessory use.  Initial chest x-ray demonstrated early left lower lobe infiltrate.  She was anxious and short of breath.  Initial lactic acid was 6.99, she received over 2 L of crystalloid resuscitation and only cleared to 6.11,  she continued to exhibit significant work of breathing, was becoming progressively anxious, and required to sit up in the tripod position to assist with her work of breathing, and required titration up to 6 L nasal cannula and saturations dropped to 82% on 2 L;  because of this pulmonary was asked to evaluate  Past Medical History  Tobacco abuse, COPD, cirrhosis, alcoholism, bipolar disease, ascites  Significant Hospital Events   12/6: Admitted with working diagnosis of CAP, lactic acidosis, sepsis, and early alcohol withdrawal.  Admitting to the intensive care due to high risk of decompensation  Consults:   Procedures:   Significant Diagnostic Tests:   Micro Data:  Blood culture x212/6 Urine  culture 12/6 Urine strep 12/6 Urine Legionella 12/6 Respiratory viral panel 12/6 Influenza panel 12/6: Negative  Antimicrobials:  Rocephin 12/6 Azithromycin 12/6  Interim history/subjective:  No issues overnight received PRN Ativan.  Currently on low-dose Precedex infusion.  Easily arousable.  Will fall back to sleep rather quickly though.  O2 saturation stable.  Objective   Blood pressure 139/85, pulse 78, temperature (!) 97.4 F (36.3 C), temperature source Oral, resp. rate (!) 36, height 5\' 2"  (1.575 m), weight 81.6 kg, SpO2 99 %.        Intake/Output Summary (Last 24 hours) at 01/14/2018 1651 Last data filed at 01/14/2018 1600 Gross per 24 hour  Intake 2718.2 ml  Output 200 ml  Net 2518.2 ml   Filed Weights   01/13/18 0912  Weight: 81.6 kg    Examination: General appearance: 57 y.o., female, resting comfortably in bed, easily arousable, chronically ill-appearing woman, older than stated age Eyes: anicteric sclerae, moist conjunctivae; will open eyes to voice HENT: NCAT; MMM,  Neck: Trachea midline; no JVD Lungs: CTAB, no crackles, no wheeze CV: Rate and rhythm, S1, S2, no MRGs  Abdomen: Soft, non-tender; non-distended, BS present  Extremities: No peripheral edema, radial and DP pulses present bilaterally  Skin: Normal temperature, turgor and texture; no rash Psych: Confused at times, easily redirectable, no obvious sign of hallucination Neuro: Alert and oriented to person, moves all 4 extremities no focal deficit    Resolved Hospital Problem list    Assessment & Plan:  Acute respiratory failure in the setting of early community-acquired pneumonia and acute exacerbation chronic structural pulmonary disease further exacerbated by alcohol  withdrawal -Portable chest x-ray personally reviewed this demonstrates early left lower lobe infiltrate Plan Current respiratory status stable on nasal cannula.  Sats high 90s 100%. She is tachypneic however.  Continue to observe.   No indication for endotracheal intubation at this time. Due to waxing and waning mental status unlikely candidate for BiPAP therapy. Wean FiO2 to maintain sats greater than 90%. Continue Rocephin and azithromycin.  Acute metabolic encephalopathy, possible hepatic encephalopathy history of cirrhosis secondary to alcoholism Early alcohol withdrawal superimposed on history of bipolar disease -She is currently very easily arousable but falls back to sleep quickly.  She is oriented when she awakes. - No immediate indication for intubation.  However if her respiratory or mental status declines any further would recommend intubation and mechanical ventilation. -Therefore should remain in the intensive care unit for this reason. Plan Okay to continue low-dose Precedex.  Would like to inform this throughout the day. We will give low-dose Librium. Continue to run Prozac Continue thiamine and folate Continue PRN dosing of Ativan for CIWA We will attempt dosing with lactulose.  If unable to swallow liquid well would consider changing to rifaximin   Sirs/sepsis secondary to pneumonia.  Suspect tachycardia also complicated by early withdrawal Plan Improving we will continue to follow. I stopped continuous maintenance fluids  Lactic acidosis/anion gap metabolic acidosis Had minimal clearance, however does have history of cirrhosis.  Ongoing lactic acidosis is most likely secondary to delayed hepatic clearance however work of breathing could also be contributing.  Do not believe that this represents an organ hypoperfusion Plan I checked ammonia level. If possible would likely need lactulose if she is able to swallow. She has had elevated ammonia levels in the past  Fluid and electrolyte imbalance: Hypokalemia Plan Replace with goal greater than 4  History of cirrhosis Plan Check LFTs  Mild anemia normocytic normochromic Plan Continue to observe.  Did fall some overnight.  No sign of active  bleeding  Best practice:  Diet: N.p.o. Pain/Anxiety/Delirium protocol (if indicated): Precedex infusion VAP protocol (if indicated): Not indicated DVT prophylaxis: Subcutaneous heparin GI prophylaxis: PPI Glucose control: Sliding scale insulin Mobility: Out of bed with assist Code Status: Full code Family Communication: Pending Disposition: Mains critically on the intensive care unit for observation of respiratory mental status at risk for endotracheal intubation.  Labs   CBC: Recent Labs  Lab 01/10/18 0736 01/13/18 0950 01/14/18 0328  WBC 8.2 15.0* 9.3  NEUTROABS  --  14.1*  --   HGB 11.5* 11.5* 9.8*  HCT 36.6 37.3 32.9*  MCV 87.4 87.4 88.4  PLT 104* 135* 99*    Basic Metabolic Panel: Recent Labs  Lab 01/10/18 0736 01/13/18 0950 01/13/18 1647 01/14/18 0328  NA 140 141  --  138  K 3.1* 3.3*  --  4.2  CL 99 97*  --  100  CO2 29 28  --  28  GLUCOSE 117* 153*  --  135*  BUN <5* <5*  --  7  CREATININE 0.33* 0.50  --  0.47  CALCIUM 8.0* 8.8*  --  8.5*  MG  --  2.1 1.7 1.7  PHOS  --   --  2.8 2.5   GFR: Estimated Creatinine Clearance: 75.9 mL/min (by C-G formula based on SCr of 0.47 mg/dL). Recent Labs  Lab 01/10/18 0736 01/13/18 0950 01/13/18 1210 01/13/18 1356 01/13/18 1647 01/14/18 0328  PROCALCITON  --   --   --   --  <0.10 <0.10  WBC 8.2 15.0*  --   --   --  9.3  LATICACIDVEN  --   --  6.99* 6.11*  --   --     Liver Function Tests: Recent Labs  Lab 01/10/18 0736  AST 26  ALT 13  ALKPHOS 108  BILITOT 1.6*  PROT 6.3*  ALBUMIN 3.6   Recent Labs  Lab 01/10/18 0736 01/13/18 1647  LIPASE 27 28   No results for input(s): AMMONIA in the last 168 hours.  ABG    Component Value Date/Time   PHART 7.377 01/14/2018 0558   PCO2ART 60.1 (H) 01/14/2018 0558   PO2ART 148.0 (H) 01/14/2018 0558   HCO3 35.2 (H) 01/14/2018 0558   TCO2 37 (H) 01/14/2018 0558   ACIDBASEDEF 5.0 (H) 01/14/2018 0530   O2SAT 99.0 01/14/2018 0558     Coagulation  Profile: Recent Labs  Lab 01/10/18 0736 01/13/18 1647  INR 1.34 1.32    Cardiac Enzymes: No results for input(s): CKTOTAL, CKMB, CKMBINDEX, TROPONINI in the last 168 hours.  HbA1C: Hgb A1c MFr Bld  Date/Time Value Ref Range Status  01/13/2018 04:47 PM 5.1 4.8 - 5.6 % Final    Comment:    (NOTE) Pre diabetes:          5.7%-6.4% Diabetes:              >6.4% Glycemic control for   <7.0% adults with diabetes   12/30/2016 12:40 PM 5.0 4.8 - 5.6 % Final    Comment:    (NOTE) Pre diabetes:          5.7%-6.4% Diabetes:              >6.4% Glycemic control for   <7.0% adults with diabetes     CBG: Recent Labs  Lab 01/13/18 2306 01/14/18 0351 01/14/18 0752 01/14/18 1217 01/14/18 1611  GLUCAP 118* 127* 110* 112* 109*    This patient is critically ill with multiple organ system failure; which, requires frequent high complexity decision making, assessment, support, evaluation, and titration of therapies. This was completed through the application of advanced monitoring technologies and extensive interpretation of multiple databases. During this encounter critical care time was devoted to patient care services described in this note for 42 minutes.   Josephine Igo, DO [employer] Pulmonary Critical Care 01/14/2018 4:52 PM  Personal pager: [phone] If unanswered, please page CCM On-call: #[phone]

## 2018-01-14 NOTE — Plan of Care (Addendum)
PCCM Plan of Care Note Evaluated patient at bedside for alteration in mental status  In summary Anne Black is a 57 YO female who was admitted on 12/6 for management of CAP and alcohol withdrawal.  Patient has been on librium and precedex gtt for management of alcohol withdrawal and this evening noted to be more somnolent. Precedex has been held  On my evaluation patient is off precedex. Somnolent but easily wakes up. Oriented to time person, place and situation. She is tachypneic to 28-29, SPO2 100% on 4L by nasal canula, HR 77bpm regular rate, sinus rhythm, Normotensive She has bilateral diffuse expiratory wheezes on auscultation No LE edema Abdomen is distended, soft, non tender  ABG obtained 7.34/63/98  Assessment and Plan 57 YO female currently being managed for CAP on ceftriaxone and azithromycin as well as alcohol withdrawal on librium, PRN ativan and precedex gtt with increased somnolence. -Continue to hold off precedex -If agitation becomes an issue, will use PRN ativan -Continue scheduled duonebs -Continue scheduled methylprednisone -Will add BID nebulized pulmicort and brovana -Frequent neuro checks and reorientation -Wean off O2 to maintain O2 sats between 88-92% -Encourage pulmonary toilet including Incentive spirometry, out of bed to chair during the day  Discussed this plan of care with bedside nurse.  Larna Daughters, M.D. [facility] Pulmonary/Critical Care Medicine. Pager:  After hours pager: [phone].

## 2018-01-14 NOTE — Progress Notes (Signed)
Received phone call from daughter "Jeral Pinch" asking about discharge plan for patient and if SW was involved. Daughter concerned that pt lives home alone and relapsed with alcohol after completing 30 day program for alcoholism. Advised that nursing is aware and will notify CM/SW.

## 2018-01-14 NOTE — Progress Notes (Signed)
eLink Physician-Brief Progress Note Patient Name: Anne Black DOB: Apr 14, 1960 MRN: [id number]   Date of Service  01/14/2018  HPI/Events of Note  ALOC and increased WOB according to nurse. Currently on Precedex IV infusion.  eICU Interventions  Will order: 1. Hold Precedex IV infusion. 2. ABG STAT. 3. Ground team notified to evaluate patient at bedside.      Intervention Category Major Interventions: Change in mental status - evaluation and management  Sommer,Steven Eugene 01/14/2018, 8:27 PM

## 2018-01-15 DIAGNOSIS — Z720 Tobacco use: Secondary | ICD-10-CM

## 2018-01-15 LAB — PROCALCITONIN: Procalcitonin: 0.12 ng/mL

## 2018-01-15 LAB — GLUCOSE, CAPILLARY
Glucose-Capillary: 108 mg/dL — ABNORMAL HIGH (ref 70–99)
Glucose-Capillary: 107 mg/dL — ABNORMAL HIGH (ref 70–99)
Glucose-Capillary: 102 mg/dL — ABNORMAL HIGH (ref 70–99)
Glucose-Capillary: 122 mg/dL — ABNORMAL HIGH (ref 70–99)
Glucose-Capillary: 154 mg/dL — ABNORMAL HIGH (ref 70–99)

## 2018-01-15 MED ORDER — CHLORDIAZEPOXIDE HCL 5 MG PO CAPS
5.0000 mg | ORAL_CAPSULE | Freq: Three times a day (TID) | ORAL | Status: DC
  Administered 2018-01-15 – 2018-01-16 (×3): 5 mg via ORAL
  Filled 2018-01-15 (×3): qty 1

## 2018-01-15 MED ORDER — AMOXICILLIN-POT CLAVULANATE 875-125 MG PO TABS
1.0000 | ORAL_TABLET | Freq: Two times a day (BID) | ORAL | Status: DC
  Administered 2018-01-15 – 2018-01-17 (×5): 1 via ORAL
  Filled 2018-01-15 (×7): qty 1

## 2018-01-15 NOTE — Progress Notes (Signed)
Report called to 5W RN

## 2018-01-15 NOTE — Progress Notes (Signed)
Pt transferred to 5W-25 via room bed. VSS during transport.

## 2018-01-15 NOTE — Progress Notes (Signed)
NAME:  Anne Black, MRN:  [id number], DOB:  1960/12/01, LOS: 2 ADMISSION DATE:  01/13/2018, CONSULTATION DATE:  12/6 REFERRING MD:  Ethelda Chick, CHIEF COMPLAINT:  Acute respiratory failure    Brief History   57 year old female admitted 12/6 with working diagnosis of community-acquired pneumonia, and early alcohol withdrawal plus SIRS/sepsis  History of present illness   57 year old white female with a significant history of daily alcohol use, tobacco abuse, and COPD.  Just finished rehab less than 1 month ago, remained sober for approximately 1 week, now back to drinking daily.  Was in her usual state of health up until 12/5 afternoon when she developed headache, sore throat, wet nonproductive cough, worsening wheezing, chest tightness, and shortness of breath.  This progressed throughout the evening and because of this she presented to the emergency room on 12/6.  In the emergency room she was found to be tachycardic, anxious, sitting in the tripod position with marked accessory use.  Initial chest x-ray demonstrated early left lower lobe infiltrate.  She was anxious and short of breath.  Initial lactic acid was 6.99, she received over 2 L of crystalloid resuscitation and only cleared to 6.11,  she continued to exhibit significant work of breathing, was becoming progressively anxious, and required to sit up in the tripod position to assist with her work of breathing, and required titration up to 6 L nasal cannula and saturations dropped to 82% on 2 L;  because of this pulmonary was asked to evaluate  Past Medical History  Tobacco abuse, COPD, cirrhosis, alcoholism, bipolar disease, ascites  Significant Hospital Events   12/6: Admitted with working diagnosis of CAP, lactic acidosis, sepsis, and early alcohol withdrawal.  Admitting to the intensive care due to high risk of decompensation  Consults:   Procedures:   Significant Diagnostic Tests:   Micro Data:  Blood culture x212/6 Urine  culture 12/6 Urine strep 12/6 Urine Legionella 12/6 Respiratory viral panel 12/6 Influenza panel 12/6: Negative  Antimicrobials:  Rocephin 12/6-stopped 01/15/2018 Azithromycin 12/6 -stopped 01/15/2018 Augmentin 01/16/2019 19 x 3 days  Interim history/subjective:  Wants to leave this afternoon.  States that she has business to take care of including paying some bills.  Otherwise states that she feels much better and is ready to go home.  Per nursing staff does become confused on occasion but when she was seen and examined this morning she did know who she was and where she was and why she was here.  Objective   Blood pressure (!) 130/56, pulse (!) 101, temperature 98.4 F (36.9 C), temperature source Axillary, resp. rate (!) 32, height 5\' 2"  (1.575 m), weight 74 kg, SpO2 95 %.        Intake/Output Summary (Last 24 hours) at 01/15/2018 1057 Last data filed at 01/15/2018 0800 Gross per 24 hour  Intake 1275.85 ml  Output 800 ml  Net 475.85 ml   Filed Weights   01/13/18 0912 01/15/18 0340  Weight: 81.6 kg 74 kg    Examination: General appearance: 57 y.o., female, NAD, conversant elderly female appears older than stated age Eyes: anicteric sclerae, moist conjunctivae; no lid-lag; PERRLA, tracking appropriately, no nystagmus HENT: NCAT; oropharynx, MMM, no mucosal ulcerations; normal hard and soft palate Neck: Trachea midline; FROM, supple, lymphadenopathy, no JVD Lungs: CTAB, no crackles, no wheeze CV: RRR, S1, S2, no MRGs  Abdomen: Soft, non-tender; non-distended, BS present  Extremities: No peripheral edema, radial and DP pulses present bilaterally  Skin: Normal temperature, turgor and texture; no rash  Psych: Appropriate affect no evidence of active delirium Neuro: Alert and oriented to person and place, no focal deficit, no asterixis    Resolved Hospital Problem list    Assessment & Plan:  Acute respiratory failure in the setting of early community-acquired pneumonia and acute  exacerbation chronic structural pulmonary disease further exacerbated by alcohol withdrawal -Portable chest x-ray personally reviewed this demonstrates early left lower lobe infiltrate Plan Currently on room air no longer requiring oxygen. De-escalate antimicrobials to Augmentin x3 additional days.  Acute metabolic encephalopathy, possible hepatic encephalopathy history of cirrhosis secondary to alcoholism Early alcohol withdrawal superimposed on history of bipolar disease Awake alert much improved encephalopathy. Plan Stopped Precedex but she has not needed this since yesterday evening. Increase Librium to 5 mg 3 times daily Continue Prozac Continue thiamine folate PRN Ativan as needed pending CIWA score  Sepsis secondary to pneumonia Plan De-escalate antimicrobials to Augmentin x3 additional days  Lactic acidosis/anion gap metabolic acidosis Had minimal clearance, however does have history of cirrhosis.  Ongoing lactic acidosis is most likely secondary to delayed hepatic clearance however work of breathing could also be contributing.  Do not believe that this represents an organ hypoperfusion Plan Improved we will continue to follow  Fluid and electrolyte imbalance: Hypokalemia Plan Correct potassium, goal greater than 4  History of cirrhosis Hyperammonemia Plan Daily lactulose  Mild anemia normocytic normochromic Plan Continue to observe no sign of active bleeding.  Best practice:  Diet: N.p.o. Pain/Anxiety/Delirium protocol (if indicated): Precedex infusion VAP protocol (if indicated): Not indicated DVT prophylaxis: Subcutaneous heparin GI prophylaxis: PPI Glucose control: Sliding scale insulin Mobility: Out of bed with assist Code Status: Full code Family Communication: Pending Disposition: stable for ICU transfer   Labs   CBC: Recent Labs  Lab 01/10/18 0736 01/13/18 0950 01/14/18 0328  WBC 8.2 15.0* 9.3  NEUTROABS  --  14.1*  --   HGB 11.5* 11.5* 9.8*    HCT 36.6 37.3 32.9*  MCV 87.4 87.4 88.4  PLT 104* 135* 99*    Basic Metabolic Panel: Recent Labs  Lab 01/10/18 0736 01/13/18 0950 01/13/18 1647 01/14/18 0328  NA 140 141  --  138  K 3.1* 3.3*  --  4.2  CL 99 97*  --  100  CO2 29 28  --  28  GLUCOSE 117* 153*  --  135*  BUN <5* <5*  --  7  CREATININE 0.33* 0.50  --  0.47  CALCIUM 8.0* 8.8*  --  8.5*  MG  --  2.1 1.7 1.7  PHOS  --   --  2.8 2.5   GFR: Estimated Creatinine Clearance: 72.2 mL/min (by C-G formula based on SCr of 0.47 mg/dL). Recent Labs  Lab 01/10/18 0736 01/13/18 0950 01/13/18 1210 01/13/18 1356 01/13/18 1647 01/14/18 0328 01/15/18 0422  PROCALCITON  --   --   --   --  <0.10 <0.10 0.12  WBC 8.2 15.0*  --   --   --  9.3  --   LATICACIDVEN  --   --  6.99* 6.11*  --   --   --     Liver Function Tests: Recent Labs  Lab 01/10/18 0736  AST 26  ALT 13  ALKPHOS 108  BILITOT 1.6*  PROT 6.3*  ALBUMIN 3.6   Recent Labs  Lab 01/10/18 0736 01/13/18 1647  LIPASE 27 28   Recent Labs  Lab 01/14/18 1827  AMMONIA 76*    ABG    Component Value Date/Time  PHART 7.335 (L) 01/14/2018 2050   PCO2ART 62.5 (H) 01/14/2018 2050   PO2ART 98.0 01/14/2018 2050   HCO3 33.3 (H) 01/14/2018 2050   TCO2 35 (H) 01/14/2018 2050   ACIDBASEDEF 5.0 (H) 01/14/2018 0530   O2SAT 97.0 01/14/2018 2050     Coagulation Profile: Recent Labs  Lab 01/10/18 0736 01/13/18 1647  INR 1.34 1.32    Cardiac Enzymes: No results for input(s): CKTOTAL, CKMB, CKMBINDEX, TROPONINI in the last 168 hours.  HbA1C: Hgb A1c MFr Bld  Date/Time Value Ref Range Status  01/13/2018 04:47 PM 5.1 4.8 - 5.6 % Final    Comment:    (NOTE) Pre diabetes:          5.7%-6.4% Diabetes:              >6.4% Glycemic control for   <7.0% adults with diabetes   12/30/2016 12:40 PM 5.0 4.8 - 5.6 % Final    Comment:    (NOTE) Pre diabetes:          5.7%-6.4% Diabetes:              >6.4% Glycemic control for   <7.0% adults with diabetes      CBG: Recent Labs  Lab 01/14/18 1611 01/14/18 1931 01/14/18 2338 01/15/18 0351 01/15/18 0751  GLUCAP 109* 112* 112* 108* 107*    Case discussed with Dr. Sharon Seller from [facility] who has agreed to take patient onto their service as of 01/16/2018.  Josephine Igo, DO [facility] 01/15/2018 10:58 AM  Personal pager: [phone] If unanswered, please page CCM On-call: #[phone]

## 2018-01-16 ENCOUNTER — Ambulatory Visit (HOSPITAL_COMMUNITY): Payer: 59 | Admitting: Nurse Practitioner

## 2018-01-16 LAB — COMPREHENSIVE METABOLIC PANEL
ALK PHOS: 85 U/L (ref 38–126)
ALT: 15 U/L (ref 0–44)
ANION GAP: 10 (ref 5–15)
AST: 18 U/L (ref 15–41)
Albumin: 2.8 g/dL — ABNORMAL LOW (ref 3.5–5.0)
BILIRUBIN TOTAL: 1.2 mg/dL (ref 0.3–1.2)
BUN: 7 mg/dL (ref 6–20)
CO2: 29 mmol/L (ref 22–32)
Calcium: 8.6 mg/dL — ABNORMAL LOW (ref 8.9–10.3)
Chloride: 98 mmol/L (ref 98–111)
Creatinine, Ser: 0.58 mg/dL (ref 0.44–1.00)
GFR calc Af Amer: >60 mL/min (ref >60)
GFR calc non Af Amer: >60 mL/min (ref >60)
Glucose, Bld: 114 mg/dL — ABNORMAL HIGH (ref 70–99)
Potassium: 3.7 mmol/L (ref 3.5–5.1)
Sodium: 137 mmol/L (ref 135–145)
Total Protein: 5.5 g/dL — ABNORMAL LOW (ref 6.5–8.1)

## 2018-01-16 LAB — CBC
HCT: 36.3 % (ref 36.0–46.0)
HEMOGLOBIN: 11.2 g/dL — AB (ref 12.0–15.0)
MCH: 26.9 pg (ref 26.0–34.0)
MCHC: 30.9 g/dL (ref 30.0–36.0)
MCV: 87.3 fL (ref 80.0–100.0)
Platelets: 101 10*3/uL — ABNORMAL LOW (ref 150–400)
RBC: 4.16 MIL/uL (ref 3.87–5.11)
RDW: 20.5 % — ABNORMAL HIGH (ref 11.5–15.5)
WBC: 8.8 10*3/uL (ref 4.0–10.5)
nRBC: 0 % (ref 0.0–0.2)

## 2018-01-16 LAB — GLUCOSE, CAPILLARY
Glucose-Capillary: 110 mg/dL — ABNORMAL HIGH (ref 70–99)
Glucose-Capillary: 118 mg/dL — ABNORMAL HIGH (ref 70–99)
Glucose-Capillary: 122 mg/dL — ABNORMAL HIGH (ref 70–99)
Glucose-Capillary: 123 mg/dL — ABNORMAL HIGH (ref 70–99)
Glucose-Capillary: 128 mg/dL — ABNORMAL HIGH (ref 70–99)
Glucose-Capillary: 93 mg/dL (ref 70–99)

## 2018-01-16 LAB — AMMONIA: Ammonia: 38 umol/L — ABNORMAL HIGH (ref 9–35)

## 2018-01-16 LAB — LEGIONELLA PNEUMOPHILA SEROGP 1 UR AG: L. pneumophila Serogp 1 Ur Ag: NEGATIVE

## 2018-01-16 LAB — MAGNESIUM: Magnesium: 1.9 mg/dL (ref 1.7–2.4)

## 2018-01-16 MED ORDER — CHLORDIAZEPOXIDE HCL 5 MG PO CAPS
10.0000 mg | ORAL_CAPSULE | Freq: Two times a day (BID) | ORAL | Status: DC
Start: 2018-01-16 — End: 2018-01-17
  Administered 2018-01-16: 10 mg via ORAL
  Filled 2018-01-16: qty 2

## 2018-01-16 NOTE — Progress Notes (Signed)
  Speech Language Pathology  Patient Details Name: Anne Black MRN: [id number] DOB: [birth year] Today's Date: 01/16/2018 Time:  -      This evaluation was ordered in error per RN who will discontinue the order.                     GO                Royce Macadamia 01/16/2018, 9:45 AM  Breck Coons Lonell Face.Ed Nurse, children's [phone] Office [phone]

## 2018-01-16 NOTE — Plan of Care (Signed)
  Problem: Activity: Goal: Risk for activity intolerance will decrease Outcome: Progressing   Problem: Safety: Goal: Ability to remain free from injury will improve Outcome: Progressing   

## 2018-01-16 NOTE — Evaluation (Signed)
Physical Therapy Evaluation Patient Details Name: Anne Black MRN: [id number] DOB: 10-23-1960 Today's Date: 01/16/2018   History of Present Illness  57 year old white female with a significant history of daily alcohol use, tobacco abuse, and COPD.  Just finished rehab less than 1 month ago, remained sober for approximately 1 week, now back to drinking daily.  Was in her usual state of health up until 12/5 afternoon when she developed headache, sore throat, wet nonproductive cough, worsening wheezing, chest tightness, and shortness of breath, she was admitted to critical care unit for sepsis and aspiration pneumonia  Clinical Impression  Orders received for PT evaluation. Patient demonstrates deficits in functional mobility as indicated below. Will benefit from continued skilled PT to address deficits and maximize function. Will see as indicated and progress as tolerated.  At this time, patient is an extremely high fall risk and shows limited insight and awareness of deficits. DO NOT feel patient is safe to d/c home, will need post acute rehabilitation.    Follow Up Recommendations SNF;Supervision/Assistance - 24 hour    Equipment Recommendations  None recommended by PT    Recommendations for Other Services       Precautions / Restrictions Precautions Precautions: Fall Restrictions Weight Bearing Restrictions: No      Mobility  Bed Mobility               General bed mobility comments: received in recliner  Transfers Overall transfer level: Needs assistance Equipment used: Rolling walker (2 wheeled) Transfers: Sit to/from Stand Sit to Stand: Min assist         General transfer comment: min assist for stability, posterior lean and fell back into chair x2 during power up attempts  Ambulation/Gait Ambulation/Gait assistance: Mod assist Gait Distance (Feet): 70 Feet Assistive device: Rolling walker (2 wheeled) Gait Pattern/deviations: Ataxic Gait velocity:  decreased Gait velocity interpretation: <1.31 ft/sec, indicative of household ambulator General Gait Details: patient min assist to moderate assist with increased ataxic gait. Multiple LOB and poor ability to maintain upright despite reliance on RW. 2 standing rest breaks during limited distance Product/process development scientist    Modified Rankin (Stroke Patients Only)       Balance Overall balance assessment: History of Falls                                           Pertinent Vitals/Pain      Home Living Family/patient expects to be discharged to:: Private residence Living Arrangements: Other relatives Available Help at Discharge: Family;Available PRN/intermittently Type of Home: House Home Access: Level entry     Home Layout: Two level;Able to live on main level with bedroom/bathroom Home Equipment: Dan Humphreys - 2 wheels;Cane - single point;Wheelchair - Lawyer Comments: above per previous admission + pt reports shower seat    Prior Function Level of Independence: Independent with assistive device(s)               Hand Dominance   Dominant Hand: Right    Extremity/Trunk Assessment   Upper Extremity Assessment Upper Extremity Assessment: Generalized weakness    Lower Extremity Assessment Lower Extremity Assessment: Generalized weakness(decreased coordination bilaterally)       Communication   Communication: No difficulties  Cognition Arousal/Alertness: Awake/alert Behavior During Therapy: WFL for tasks assessed/performed Overall Cognitive Status: Impaired/Different from baseline  Area of Impairment: Attention;Memory;Following commands;Safety/judgement;Awareness;Problem solving                   Current Attention Level: Sustained Memory: Decreased recall of precautions;Decreased short-term memory Following Commands: Follows one step commands with increased time Safety/Judgement:  Decreased awareness of safety;Decreased awareness of deficits Awareness: Emergent Problem Solving: Slow processing;Decreased initiation;Difficulty sequencing;Requires verbal cues;Requires tactile cues General Comments: patient tangential and confused during session, attempting to drink from her call bell      General Comments      Exercises     Assessment/Plan    PT Assessment Patient needs continued PT services  PT Problem List Decreased strength;Decreased activity tolerance;Decreased mobility;Decreased coordination;Decreased balance;Decreased safety awareness;Decreased cognition;Cardiopulmonary status limiting activity       PT Treatment Interventions DME instruction;Gait training;Stair training;Functional mobility training;Therapeutic activities;Therapeutic exercise;Balance training;Cognitive remediation;Patient/family education    PT Goals (Current goals can be found in the Care Plan section)  Acute Rehab PT Goals Patient Stated Goal: none stated PT Goal Formulation: With patient Time For Goal Achievement: 01/30/18 Potential to Achieve Goals: Good    Frequency Min 2X/week   Barriers to discharge Decreased caregiver support      Co-evaluation               AM-PAC PT "6 Clicks" Mobility  Outcome Measure Help needed turning from your back to your side while in a flat bed without using bedrails?: A Little Help needed moving from lying on your back to sitting on the side of a flat bed without using bedrails?: A Little Help needed moving to and from a bed to a chair (including a wheelchair)?: A Little Help needed standing up from a chair using your arms (e.g., wheelchair or bedside chair)?: A Lot Help needed to walk in hospital room?: A Lot Help needed climbing 3-5 steps with a railing? : A Lot 6 Click Score: 15    End of Session Equipment Utilized During Treatment: Gait belt;Oxygen Activity Tolerance: Patient tolerated treatment well;Patient limited by  fatigue Patient left: in chair;with call bell/phone within reach;with chair alarm set Nurse Communication: Mobility status PT Visit Diagnosis: Repeated falls (R29.6);Unsteadiness on feet (R26.81);Ataxic gait (R26.0)    Time: 8333-8329 PT Time Calculation (min) (ACUTE ONLY): 21 min   Charges:   PT Evaluation $PT Eval Moderate Complexity: 1 Mod          Charlotte Crumb, PT DPT  Board Certified Neurologic Specialist Acute Rehabilitation Services Pager [phone] Office [phone]   Fabio Asa 01/16/2018, 12:23 PM

## 2018-01-16 NOTE — Progress Notes (Addendum)
PROGRESS NOTE                                                                                                                                                                                                             Patient Demographics:    Anne Black, is a 57 y.o. female, DOB - Jul 19, 1960, ZOX:[id number]  Admit date - 01/13/2018   Admitting Physician Leslye Peer, MD  Outpatient Primary MD for the patient is Doristine Bosworth, MD  LOS - 3  Chief Complaint  Patient presents with  . Migraine  . Shortness of Breath       Brief Narrative 57 year old white female with a significant history of daily alcohol use, tobacco abuse, and COPD.  Just finished rehab less than 1 month ago, remained sober for approximately 1 week, now back to drinking daily.  Was in her usual state of health up until 12/5 afternoon when she developed headache, sore throat, wet nonproductive cough, worsening wheezing, chest tightness, and shortness of breath, she was admitted to critical care unit for sepsis and aspiration pneumonia.   Subjective:    Anne Black today has, No headache, No chest pain, No abdominal pain - No Nausea, No new weakness tingling or numbness, No Cough - SOB.     Assessment  & Plan :     1.  Acute hypoxic respiratory failure, sepsis due to aspiration pneumonia.  Initially treated in ICU, sepsis physiology has resolved, hypoxic respiratory failure has improved, will try to titrate down oxygen, currently on Augmentin finished course, speech therapy following.  2.  History of alcohol abuse, alcoholic cirrhosis with thrombocytopenia and ascites.  Counseled to quit alcohol, monitor platelet levels, currently in mild DTs, continue Librium along with Ativan as needed.  Not stable for discharge.  3.  COPD with smoking.  No acute issues counseled to quit smoking.  Supportive care for COPD.  4.  Chronic macrocytic anemia due to alcohol abuse.  Stable monitor.  5.  Mild metabolic  encephalopathy.  Due to DTs, monitor with supportive care, will also keep an eye on ammonia levels due to underlying cirrhosis.  On lactulose which will be continued.  6.  GERD.  On PPI continue.    Family Communication  :  none  Code Status :  Full  Disposition Plan  :  Med  Consults  :  PCCM  Procedures  :    DVT Prophylaxis  :    Heparin   Lab Results  Component Value Date   PLT 101 (L) 01/16/2018    Diet :  Diet Order            Diet regular Room service appropriate? Yes; Fluid consistency: Thin  Diet effective now               Inpatient Medications Scheduled Meds: . amoxicillin-clavulanate  1 tablet Oral Q12H  . arformoterol  15 mcg Nebulization BID  . budesonide (PULMICORT) nebulizer solution  0.25 mg Nebulization BID  . chlordiazePOXIDE  5 mg Oral TID  . FLUoxetine  20 mg Oral Daily  . folic acid  1 mg Oral Daily  . gabapentin  200 mg Oral TID  . heparin  5,000 Units Subcutaneous Q8H  . insulin aspart  0-15 Units Subcutaneous Q4H  . ipratropium-albuterol  3 mL Nebulization Q6H  . lactulose  20 g Oral BID  . methylPREDNISolone (SOLU-MEDROL) injection  40 mg Intravenous Q12H  . nicotine  14 mg Transdermal Daily  . pantoprazole  40 mg Oral Daily  . thiamine  100 mg Oral Daily  . topiramate  50 mg Oral QHS   Continuous Infusions: PRN Meds:.albuterol, LORazepam, ondansetron  Antibiotics  :   Anti-infectives (From admission, onward)   Start     Dose/Rate Route Frequency Ordered Stop   01/15/18 1200  amoxicillin-clavulanate (AUGMENTIN) 875-125 MG per tablet 1 tablet     1 tablet Oral Every 12 hours 01/15/18 1101 01/18/18 0959   01/13/18 1130  cefTRIAXone (ROCEPHIN) 2 g in sodium chloride 0.9 % 100 mL IVPB  Status:  Discontinued     2 g 200 mL/hr over 30 Minutes Intravenous Every 24 hours 01/13/18 1117 01/15/18 1101   01/13/18 1130  azithromycin (ZITHROMAX) 500 mg in sodium chloride 0.9 % 250 mL IVPB  Status:  Discontinued     500 mg 250 mL/hr over 60  Minutes Intravenous Every 24 hours 01/13/18 1117 01/15/18 1101          Objective:   Vitals:   01/16/18 0159 01/16/18 0643 01/16/18 0910 01/16/18 1028  BP:  128/60  138/62  Pulse:  72  95  Resp:    14  Temp:  98.1 F (36.7 C)  97.9 F (36.6 C)  TempSrc:  Oral    SpO2: 99% 99% 98% 97%  Weight:      Height:        Wt Readings from Last 3 Encounters:  01/15/18 74 kg  01/10/18 81.6 kg  11/17/17 68 kg     Intake/Output Summary (Last 24 hours) at 01/16/2018 1112 Last data filed at 01/16/2018 0859 Gross per 24 hour  Intake 580 ml  Output 725 ml  Net -145 ml     Physical Exam  Awake but mildly confused, No new F.N deficits, Normal affect [place].AT,PERRAL Supple Neck,No JVD, No cervical lymphadenopathy appriciated.  Symmetrical Chest wall movement, Good air movement bilaterally, CTAB RRR,No Gallops,Rubs or new Murmurs, No Parasternal Heave +ve B.Sounds, Abd Soft, No tenderness, No organomegaly appriciated, No rebound - guarding or rigidity. No Cyanosis, Clubbing or edema, No new Rash or bruise       Data Review:    CBC Recent Labs  Lab 01/10/18 0736 01/13/18 0950 01/14/18 0328 01/16/18 0755  WBC 8.2 15.0* 9.3 8.8  HGB 11.5* 11.5* 9.8* 11.2*  HCT 36.6 37.3 32.9* 36.3  PLT 104* 135* 99* 101*  MCV 87.4 87.4 88.4 87.3  MCH 27.4 26.9 26.3 26.9  MCHC 31.4 30.8 29.8* 30.9  RDW 19.8*  20.5* 20.4* 20.5*  LYMPHSABS  --  0.6*  --   --   MONOABS  --  0.3  --   --   EOSABS  --  0.0  --   --   BASOSABS  --  0.0  --   --     Chemistries  Recent Labs  Lab 01/10/18 0736 01/13/18 0950 01/13/18 1647 01/14/18 0328 01/16/18 0755  NA 140 141  --  138 137  K 3.1* 3.3*  --  4.2 3.7  CL 99 97*  --  100 98  CO2 29 28  --  28 29  GLUCOSE 117* 153*  --  135* 114*  BUN <5* <5*  --  7 7  CREATININE 0.33* 0.50  --  0.47 0.58  CALCIUM 8.0* 8.8*  --  8.5* 8.6*  MG  --  2.1 1.7 1.7 1.9  AST 26  --   --   --  18  ALT 13  --   --   --  15  ALKPHOS 108  --   --   --  85    BILITOT 1.6*  --   --   --  1.2   ------------------------------------------------------------------------------------------------------------------ No results for input(s): CHOL, HDL, LDLCALC, TRIG, CHOLHDL, LDLDIRECT in the last 72 hours.  Lab Results  Component Value Date   HGBA1C 5.1 01/13/2018   ------------------------------------------------------------------------------------------------------------------ No results for input(s): TSH, T4TOTAL, T3FREE, THYROIDAB in the last 72 hours.  Invalid input(s): FREET3 ------------------------------------------------------------------------------------------------------------------ No results for input(s): VITAMINB12, FOLATE, FERRITIN, TIBC, IRON, RETICCTPCT in the last 72 hours.  Coagulation profile Recent Labs  Lab 01/10/18 0736 01/13/18 1647  INR 1.34 1.32    No results for input(s): DDIMER in the last 72 hours.  Cardiac Enzymes No results for input(s): CKMB, TROPONINI, MYOGLOBIN in the last 168 hours.  Invalid input(s): CK ------------------------------------------------------------------------------------------------------------------    Component Value Date/Time   BNP 49.6 08/28/2017 0981    Micro Results Recent Results (from the past 240 hour(s))  Blood Culture (routine x 2)     Status: None (Preliminary result)   Collection Time: 01/13/18 11:17 AM  Result Value Ref Range Status   Specimen Description BLOOD BLOOD LEFT WRIST  Final   Special Requests   Final    BOTTLES DRAWN AEROBIC AND ANAEROBIC Blood Culture adequate volume   Culture   Final    NO GROWTH 2 DAYS Performed at [facility], [address] [place], [place] [address]    Report Status PENDING  Incomplete  Blood Culture (routine x 2)     Status: None (Preliminary result)   Collection Time: 01/13/18 11:22 AM  Result Value Ref Range Status   Specimen Description BLOOD RIGHT HAND  Final   Special Requests   Final    BOTTLES DRAWN AEROBIC AND  ANAEROBIC Blood Culture adequate volume   Culture   Final    NO GROWTH 2 DAYS Performed at [facility], [address] [place], [place] [address]    Report Status PENDING  Incomplete  Respiratory Panel by PCR     Status: None   Collection Time: 01/13/18  2:49 PM  Result Value Ref Range Status   Adenovirus NOT DETECTED NOT DETECTED Final   Coronavirus 229E NOT DETECTED NOT DETECTED Final   Coronavirus HKU1 NOT DETECTED NOT DETECTED Final   Coronavirus NL63 NOT DETECTED NOT DETECTED Final   Coronavirus OC43 NOT DETECTED NOT DETECTED Final   Metapneumovirus NOT DETECTED NOT DETECTED Final  Rhinovirus / Enterovirus NOT DETECTED NOT DETECTED Final   Influenza A NOT DETECTED NOT DETECTED Final   Influenza B NOT DETECTED NOT DETECTED Final   Parainfluenza Virus 1 NOT DETECTED NOT DETECTED Final   Parainfluenza Virus 2 NOT DETECTED NOT DETECTED Final   Parainfluenza Virus 3 NOT DETECTED NOT DETECTED Final   Parainfluenza Virus 4 NOT DETECTED NOT DETECTED Final   Respiratory Syncytial Virus NOT DETECTED NOT DETECTED Final   Bordetella pertussis NOT DETECTED NOT DETECTED Final   Chlamydophila pneumoniae NOT DETECTED NOT DETECTED Final   Mycoplasma pneumoniae NOT DETECTED NOT DETECTED Final  MRSA PCR Screening     Status: None   Collection Time: 01/13/18  5:32 PM  Result Value Ref Range Status   MRSA by PCR NEGATIVE NEGATIVE Final    Comment:        The GeneXpert MRSA Assay (FDA approved for NASAL specimens only), is one component of a comprehensive MRSA colonization surveillance program. It is not intended to diagnose MRSA infection nor to guide or monitor treatment for MRSA infections. Performed at [facility], [address] [place], [place] [address]     Radiology Reports Dg Chest Port 1 View  Result Date: 01/14/2018 CLINICAL DATA:  Pneumonia. EXAM: PORTABLE CHEST 1 VIEW COMPARISON:  Chest x-ray from yesterday. FINDINGS: The heart size and mediastinal contours  are within normal limits. Normal pulmonary vascularity. Unchanged somewhat linear opacities at the left lung base. No pleural effusion or pneumothorax. No acute osseous abnormality. IMPRESSION: Unchanged left lower lobe atelectasis versus infiltrate. Electronically Signed   By: Obie Dredge M.D.   On: 01/14/2018 07:25   Dg Chest Port 1 View  Result Date: 01/13/2018 CLINICAL DATA:  Headache, shortness of breath and productive cough. EXAM: PORTABLE CHEST 1 VIEW COMPARISON:  08/28/2017, chest CT FINDINGS: Cardiomediastinal silhouette is normal. Mediastinal contours appear intact. There is no pleural effusion or pneumothorax. Peribronchial airspace consolidation in the left lower thorax. Osseous structures are without acute abnormality. Soft tissues are grossly normal. Breast implants noted. IMPRESSION: Peribronchial airspace consolidation the left lower thorax may represent atelectasis or early infectious consolidation. Electronically Signed   By: Ted Mcalpine M.D.   On: 01/13/2018 09:56    Time Spent in minutes  30   Susa Raring M.D on 01/16/2018 at 11:12 AM  To page go to [email or web address] - password [facility]

## 2018-01-17 LAB — GLUCOSE, CAPILLARY
Glucose-Capillary: 105 mg/dL — ABNORMAL HIGH (ref 70–99)
Glucose-Capillary: 98 mg/dL (ref 70–99)
Glucose-Capillary: 109 mg/dL — ABNORMAL HIGH (ref 70–99)
Glucose-Capillary: 113 mg/dL — ABNORMAL HIGH (ref 70–99)
Glucose-Capillary: 130 mg/dL — ABNORMAL HIGH (ref 70–99)
Glucose-Capillary: 128 mg/dL — ABNORMAL HIGH (ref 70–99)

## 2018-01-17 MED ORDER — HALOPERIDOL LACTATE 5 MG/ML IJ SOLN: 2.0000 mg | Freq: Four times a day (QID) | INTRAMUSCULAR | Status: DC | PRN

## 2018-01-17 MED ORDER — LORAZEPAM 2 MG/ML IJ SOLN
1.0000 mg | INTRAMUSCULAR | Status: DC | PRN
  Administered 2018-01-18 – 2018-01-19 (×2): 1 mg via INTRAVENOUS
  Filled 2018-01-17 (×2): qty 1

## 2018-01-17 MED ORDER — CHLORDIAZEPOXIDE HCL 5 MG PO CAPS
5.0000 mg | ORAL_CAPSULE | Freq: Two times a day (BID) | ORAL | Status: DC
  Filled 2018-01-17 (×2): qty 1

## 2018-01-17 NOTE — NC FL2 (Signed)
[place] MEDICAID FL2 LEVEL OF CARE SCREENING TOOL     IDENTIFICATION  Patient Name: Anne Black Birthdate: 25-Oct-1960 Sex: female Admission Date (Current Location): 01/13/2018  [facility] and [place] Number:  Producer, television/film/video and Address:  [facility] [place], [address] [place], [place] [address]      Provider Number: [id number]  Attending Physician Name and Address:  Leroy Sea, MD  Relative Name and Phone Number:       Current Level of Care: Hospital Recommended Level of Care: Skilled Nursing Facility Prior Approval Number:    Date Approved/Denied:   PASRR Number:    Discharge Plan: SNF    Current Diagnoses: Patient Active Problem List   Diagnosis Date Noted  . Community acquired pneumonia of left lower lobe of lung (HCC)   . Weakness 08/21/2017  . COPD exacerbation (HCC) 06/12/2017  . Chest pain with moderate risk for cardiac etiology 05/30/2017  . Abnormal LFTs 05/30/2017  . Tachycardia 05/30/2017  . COPD (chronic obstructive pulmonary disease) (HCC) 04/19/2017  . Right rib fracture 04/15/2017  . Abdominal wall cellulitis 12/30/2016  . S/P exploratory laparotomy 12/30/2016  . Distal radius fracture, left 09/09/2016  . Thrombocytopenia (HCC) 09/09/2016  . Chest x-ray abnormality   . Umbilical hernia without obstruction and without gangrene 05/12/2016  . Itching 02/24/2016  . Ventral hernia without obstruction or gangrene 02/24/2016  . Palliative care encounter   . Ascites   . Tachypnea   . Atrial flutter with rapid ventricular response (HCC)   . Hypomagnesemia   . Hypoxia   . Dehydration 01/22/2016  . Low back ache 12/30/2015  . Non-intractable vomiting with nausea   . Alcohol abuse with alcohol-induced mood disorder (HCC) 12/15/2015  . Acute alcoholism (HCC)   . Abdominal distension 11/04/2015  . Alcohol abuse   . Dyspnea   . Acute respiratory failure (HCC) 10/15/2015  . Ascites due to alcoholic cirrhosis (HCC)  10/15/2015  . Osteoporosis   . Bipolar I disorder (HCC)   . Multiple sclerosis (HCC)   . Alcoholic cirrhosis of liver with ascites (HCC) 08/22/2015  . Bipolar disorder, current episode mixed, moderate (HCC) 08/01/2015  . Alcohol use disorder, severe, dependence (HCC) 07/31/2015  . Macrocytic anemia- due to alcohol abuse with normal B12 & folate levels 05/31/2015  . Severe protein-calorie malnutrition (HCC) 05/31/2015  . Hypokalemia 05/26/2015  . Encephalopathy, hepatic (HCC) 05/26/2015  . Alcohol withdrawal (HCC) 05/18/2015  . Abdominal pain 05/18/2015  . Anxiety 05/18/2015  . Severe sepsis (HCC) 05/18/2015  . UTI (lower urinary tract infection) 05/18/2015  . Stimulant abuse (HCC) 12/27/2014  . Nicotine abuse 12/27/2014  . Hyperprolactinemia (HCC) 11/15/2014  . Dyslipidemia   . Tobacco abuse 01/23/2014  . Noncompliance with therapeutic plan 04/04/2013  . Hereditary and idiopathic peripheral neuropathy 05/01/2012  . GERD (gastroesophageal reflux disease) 05/01/2012  . Osteoarthrosis, unspecified whether generalized or localized, involving lower leg 05/01/2012  . Hyponatremia 07/01/2011    Orientation RESPIRATION BLADDER Height & Weight     Self, Situation, Place  O2(Nasal cannula 2L) Continent Weight: 72.5 kg Height:  5\' 2"  (157.5 cm)  BEHAVIORAL SYMPTOMS/MOOD NEUROLOGICAL BOWEL NUTRITION STATUS      Continent Diet(Please see DC Summary)  AMBULATORY STATUS COMMUNICATION OF NEEDS Skin   Limited Assist Verbally Normal                       Personal Care Assistance Level of Assistance  Bathing, Feeding, Dressing Bathing  Assistance: Maximum assistance Feeding assistance: Limited assistance Dressing Assistance: Limited assistance     Functional Limitations Info  Sight, Hearing, Speech Sight Info: Adequate Hearing Info: Adequate Speech Info: Adequate    SPECIAL CARE FACTORS FREQUENCY  PT (By licensed PT), OT (By licensed OT)     PT Frequency: 5x/week OT  Frequency: 3x/week            Contractures Contractures Info: Not present    Additional Factors Info  Code Status, Allergies, Psychotropic, Insulin Sliding Scale Code Status Info: Full Allergies Info: NKA Psychotropic Info: Librium; Prozac Insulin Sliding Scale Info: Every 4 hours       Current Medications (01/17/2018):  This is the current hospital active medication list Current Facility-Administered Medications  Medication Dose Route Frequency Provider Last Rate Last Dose  . albuterol (PROVENTIL) (2.5 MG/3ML) 0.083% nebulizer solution 2.5 mg  2.5 mg Nebulization Q2H PRN Simonne Martinet, NP   2.5 mg at 01/13/18 2055  . amoxicillin-clavulanate (AUGMENTIN) 875-125 MG per tablet 1 tablet  1 tablet Oral Q12H Icard, Bradley L, DO   1 tablet at 01/16/18 2142  . arformoterol (BROVANA) nebulizer solution 15 mcg  15 mcg Nebulization BID Campbell Riches, MD   15 mcg at 01/17/18 0858  . budesonide (PULMICORT) nebulizer solution 0.25 mg  0.25 mg Nebulization BID Campbell Riches, MD   0.25 mg at 01/17/18 0858  . chlordiazePOXIDE (LIBRIUM) capsule 10 mg  10 mg Oral BID Leroy Sea, MD   10 mg at 01/16/18 2017  . FLUoxetine (PROZAC) capsule 20 mg  20 mg Oral Daily Simonne Martinet, NP   20 mg at 01/16/18 0946  . folic acid (FOLVITE) tablet 1 mg  1 mg Oral Daily Simonne Martinet, NP   1 mg at 01/16/18 0946  . gabapentin (NEURONTIN) capsule 200 mg  200 mg Oral TID Simonne Martinet, NP   200 mg at 01/16/18 2142  . heparin injection 5,000 Units  5,000 Units Subcutaneous Q8H Simonne Martinet, NP   5,000 Units at 01/17/18 0535  . insulin aspart (novoLOG) injection 0-15 Units  0-15 Units Subcutaneous Q4H Simonne Martinet, NP   2 Units at 01/16/18 1639  . ipratropium-albuterol (DUONEB) 0.5-2.5 (3) MG/3ML nebulizer solution 3 mL  3 mL Nebulization Q6H Simonne Martinet, NP   3 mL at 01/17/18 0858  . lactulose (CHRONULAC) 10 GM/15ML solution 20 g  20 g Oral BID Icard, Bradley L, DO   20 g at  01/16/18 2143  . LORazepam (ATIVAN) injection 1-2 mg  1-2 mg Intravenous Q1H PRN Reyes Ivan, MD   2 mg at 01/16/18 2151  . methylPREDNISolone sodium succinate (SOLU-MEDROL) 40 mg/mL injection 40 mg  40 mg Intravenous Q12H Simonne Martinet, NP   40 mg at 01/17/18 0226  . nicotine (NICODERM CQ - dosed in mg/24 hours) patch 14 mg  14 mg Transdermal Daily Simonne Martinet, NP   14 mg at 01/16/18 0947  . ondansetron (ZOFRAN-ODT) disintegrating tablet 4 mg  4 mg Oral TID PRN Simonne Martinet, NP      . pantoprazole (PROTONIX) EC tablet 40 mg  40 mg Oral Daily Simonne Martinet, NP   40 mg at 01/16/18 0946  . thiamine (VITAMIN B-1) tablet 100 mg  100 mg Oral Daily Simonne Martinet, NP   100 mg at 01/16/18 0946  . topiramate (TOPAMAX) tablet 50 mg  50 mg Oral QHS Simonne Martinet, NP   50  mg at 01/16/18 2142     Discharge Medications: Please see discharge summary for a list of discharge medications.  Relevant Imaging Results:  Relevant Lab Results:   Additional Information SS#: [id number]  Renne Crigler Hilmer Aliberti, LCSW

## 2018-01-17 NOTE — Progress Notes (Signed)
Physical Therapy Treatment Patient Details Name: TASHAI FORTUN MRN: [id number] DOB: [birth year] Today's Date: 01/17/2018    History of Present Illness 57 year old white female with a significant history of daily alcohol use, tobacco abuse, and COPD.  Just finished rehab less than 1 month ago, remained sober for approximately 1 week, now back to drinking daily.  Was in her usual state of health up until 12/5 afternoon when she developed headache, sore throat, wet nonproductive cough, worsening wheezing, chest tightness, and shortness of breath, she was admitted to critical care unit for sepsis and aspiration pneumonia    PT Comments    Patient's tolerance to treatment today was fair.  Patient was in bed with nursing present upon PT arrival.  Patient was not agreeable to therapy initially but was willing to participate after education and encouragement from therapist and nursing was provided.  Patient ambulated short distance from room into hallway requiring min A for maintaining path of RW.  O2 sats were measured at 90-91% prior to nurse applying oxygen via [place].  Patient would continue to benefit from acute care PT in order to address strength and mobility deficits.  Patient remains a good candidate for SNF placement based on current functional status.    Follow Up Recommendations  SNF;Supervision/Assistance - 24 hour     Equipment Recommendations  None recommended by PT    Recommendations for Other Services       Precautions / Restrictions Precautions Precautions: Fall Restrictions Weight Bearing Restrictions: No    Mobility  Bed Mobility Overal bed mobility: Needs Assistance Bed Mobility: Supine to Sit     Supine to sit: Min assist;+2 for physical assistance;HOB elevated     General bed mobility comments: Pt required min A +2 from therapist and tech to pull into sitting with HOB elevated.  VC to move B LE over EOB and to square up hips.  Transfers Overall transfer level:  Needs assistance Equipment used: Rolling walker (2 wheeled) Transfers: Sit to/from Stand Sit to Stand: Min assist         General transfer comment: Pt required VC for hand placement on bed and min A for steadying once standing in RW.  VC to reach back for railing in bathroom and armrests on chair prior to sitting.  Min A to boost into standing from toilet.  Ambulation/Gait Ambulation/Gait assistance: Min assist Gait Distance (Feet): 25 Feet Assistive device: Rolling walker (2 wheeled) Gait Pattern/deviations: Step-through pattern;Drifts right/left Gait velocity: decreased   General Gait Details: Pt required min A to maintain path of RW but demonstrated less ataxic gait with no overt LOB.  1 brief standing rest break prior to returning to room.   Stairs             Wheelchair Mobility    Modified Rankin (Stroke Patients Only)       Balance Overall balance assessment: History of Falls                                          Cognition Arousal/Alertness: Awake/alert Behavior During Therapy: WFL for tasks assessed/performed Overall Cognitive Status: Impaired/Different from baseline Area of Impairment: Safety/judgement;Following commands;Problem solving;Memory                     Memory: Decreased recall of precautions;Decreased short-term memory Following Commands: Follows one step commands with increased time Safety/Judgement: Decreased awareness of  safety;Decreased awareness of deficits   Problem Solving: Requires verbal cues;Slow processing General Comments: pt continued to be slightly confused during session      Exercises      General Comments        Pertinent Vitals/Pain Pain Assessment: Faces Faces Pain Scale: Hurts a little bit Pain Location: (moaning throughout session but location not verbalized) Pain Descriptors / Indicators: Moaning Pain Intervention(s): Limited activity within patient's tolerance;Monitored during  session    Home Living                      Prior Function            PT Goals (current goals can now be found in the care plan section) Acute Rehab PT Goals Patient Stated Goal: none stated PT Goal Formulation: With patient Time For Goal Achievement: 01/30/18 Potential to Achieve Goals: Good Progress towards PT goals: Progressing toward goals    Frequency    Min 2X/week      PT Plan      Co-evaluation              AM-PAC PT "6 Clicks" Mobility   Outcome Measure  Help needed turning from your back to your side while in a flat bed without using bedrails?: A Little Help needed moving from lying on your back to sitting on the side of a flat bed without using bedrails?: A Little Help needed moving to and from a bed to a chair (including a wheelchair)?: A Little Help needed standing up from a chair using your arms (e.g., wheelchair or bedside chair)?: A Lot Help needed to walk in hospital room?: A Little Help needed climbing 3-5 steps with a railing? : A Lot 6 Click Score: 16    End of Session Equipment Utilized During Treatment: Gait belt Activity Tolerance: Patient tolerated treatment well;Patient limited by fatigue Patient left: in chair;with call bell/phone within reach;with chair alarm set;with nursing/sitter in room Nurse Communication: Mobility status PT Visit Diagnosis: Repeated falls (R29.6);Unsteadiness on feet (R26.81);Ataxic gait (R26.0)     Time: 1610-9604 PT Time Calculation (min) (ACUTE ONLY): 25 min  Charges:  $Gait Training: 8-22 mins $Therapeutic Activity: 8-22 mins                     [address] [place] 01/17/2018, 4:49 PM

## 2018-01-17 NOTE — Progress Notes (Signed)
PROGRESS NOTE                                                                                                                                                                                                             Patient Demographics:    Anne Black, is a 57 y.o. female, DOB - June 04, 1960, SWF:[id number]  Admit date - 01/13/2018   Admitting Physician Leslye Peer, MD  Outpatient Primary MD for the patient is Doristine Bosworth, MD  LOS - 4  Chief Complaint  Patient presents with  . Migraine  . Shortness of Breath       Brief Narrative 57 year old white female with a significant history of daily alcohol use, tobacco abuse, and COPD.  Just finished rehab less than 1 month ago, remained sober for approximately 1 week, now back to drinking daily.  Was in her usual state of health up until 12/5 afternoon when she developed headache, sore throat, wet nonproductive cough, worsening wheezing, chest tightness, and shortness of breath, she was admitted to critical care unit for sepsis and aspiration pneumonia.   Subjective:    Patient in bed, appears comfortable, denies any headache, no fever, no chest pain or pressure, no shortness of breath , no abdominal pain. No focal weakness.   Assessment  & Plan :     1.  Acute hypoxic respiratory failure, sepsis due to aspiration pneumonia.  Initially treated in ICU, sepsis physiology has resolved, hypoxic respiratory failure has improved, will try to titrate down oxygen, will stop Augmentin on 01/17/2018, speech therapy following clinically stable.  2.  History of alcohol abuse, alcoholic cirrhosis with thrombocytopenia and ascites.  Consult to quit alcohol, will continue to monitor platelets, is in mild DTs but now slightly more sedated than what we would like, will drop Librium and Ativan dose and monitor.  Continue folic acid and thiamine. PRN Haldol for agitation.  3.  COPD with smoking.  No acute issues counseled to quit smoking.   Supportive care for COPD.  4.  Chronic macrocytic anemia due to alcohol abuse.  Stable monitor.  5.  Mild metabolic encephalopathy.  Due to DTs, monitor with supportive care, will also keep an eye on ammonia levels due to underlying cirrhosis, repeat ammonia levels tomorrow.  On lactulose which will be continued.  6.  GERD.  On PPI continue.    Family Communication  :  none  Code Status :  Full  Disposition Plan  :  Med  Consults  :  PCCM  Procedures  :    DVT Prophylaxis  :    Heparin   Lab Results  Component Value Date   PLT 101 (L) 01/16/2018    Diet :  Diet Order            Diet regular Room service appropriate? Yes; Fluid consistency: Thin  Diet effective now               Inpatient Medications Scheduled Meds: . arformoterol  15 mcg Nebulization BID  . budesonide (PULMICORT) nebulizer solution  0.25 mg Nebulization BID  . chlordiazePOXIDE  5 mg Oral BID  . FLUoxetine  20 mg Oral Daily  . folic acid  1 mg Oral Daily  . gabapentin  200 mg Oral TID  . heparin  5,000 Units Subcutaneous Q8H  . insulin aspart  0-15 Units Subcutaneous Q4H  . ipratropium-albuterol  3 mL Nebulization Q6H  . lactulose  20 g Oral BID  . nicotine  14 mg Transdermal Daily  . pantoprazole  40 mg Oral Daily  . thiamine  100 mg Oral Daily   Continuous Infusions: PRN Meds:.albuterol, haloperidol lactate, LORazepam, ondansetron  Antibiotics  :   Anti-infectives (From admission, onward)   Start     Dose/Rate Route Frequency Ordered Stop   01/15/18 1200  amoxicillin-clavulanate (AUGMENTIN) 875-125 MG per tablet 1 tablet  Status:  Discontinued     1 tablet Oral Every 12 hours 01/15/18 1101 01/17/18 1132   01/13/18 1130  cefTRIAXone (ROCEPHIN) 2 g in sodium chloride 0.9 % 100 mL IVPB  Status:  Discontinued     2 g 200 mL/hr over 30 Minutes Intravenous Every 24 hours 01/13/18 1117 01/15/18 1101   01/13/18 1130  azithromycin (ZITHROMAX) 500 mg in sodium chloride 0.9 % 250 mL IVPB  Status:   Discontinued     500 mg 250 mL/hr over 60 Minutes Intravenous Every 24 hours 01/13/18 1117 01/15/18 1101          Objective:   Vitals:   01/17/18 0500 01/17/18 0854 01/17/18 0859 01/17/18 0900  BP:  127/71    Pulse:  68    Resp:  18    Temp:  98.4 F (36.9 C)    TempSrc:  Oral    SpO2:  (!) 86% 96% 96%  Weight: 72.5 kg     Height:        Wt Readings from Last 3 Encounters:  01/17/18 72.5 kg  01/10/18 81.6 kg  11/17/17 68 kg     Intake/Output Summary (Last 24 hours) at 01/17/2018 1133 Last data filed at 01/17/2018 0800 Gross per 24 hour  Intake 0 ml  Output 2050 ml  Net -2050 ml     Physical Exam  Somnolent this morning but able to answer questions and follow basic commands, .AT,PERRAL Supple Neck,No JVD, No cervical lymphadenopathy appriciated.  Symmetrical Chest wall movement, Good air movement bilaterally, CTAB RRR,No Gallops, Rubs or new Murmurs, No Parasternal Heave +ve B.Sounds, Abd Soft, No tenderness, No organomegaly appriciated, No rebound - guarding or rigidity. No Cyanosis, Clubbing or edema, No new Rash or bruise     Data Review:    CBC Recent Labs  Lab 01/13/18 0950 01/14/18 0328 01/16/18 0755  WBC 15.0* 9.3 8.8  HGB 11.5* 9.8* 11.2*  HCT 37.3 32.9* 36.3  PLT 135* 99* 101*  MCV 87.4 88.4 87.3  MCH 26.9 26.3 26.9  MCHC 30.8 29.8* 30.9  RDW 20.5* 20.4* 20.5*  LYMPHSABS 0.6*  --   --  MONOABS 0.3  --   --   EOSABS 0.0  --   --   BASOSABS 0.0  --   --     Chemistries  Recent Labs  Lab 01/13/18 0950 01/13/18 1647 01/14/18 0328 01/16/18 0755  NA 141  --  138 137  K 3.3*  --  4.2 3.7  CL 97*  --  100 98  CO2 28  --  28 29  GLUCOSE 153*  --  135* 114*  BUN <5*  --  7 7  CREATININE 0.50  --  0.47 0.58  CALCIUM 8.8*  --  8.5* 8.6*  MG 2.1 1.7 1.7 1.9  AST  --   --   --  18  ALT  --   --   --  15  ALKPHOS  --   --   --  85  BILITOT  --   --   --  1.2    ------------------------------------------------------------------------------------------------------------------ No results for input(s): CHOL, HDL, LDLCALC, TRIG, CHOLHDL, LDLDIRECT in the last 72 hours.  Lab Results  Component Value Date   HGBA1C 5.1 01/13/2018   ------------------------------------------------------------------------------------------------------------------ No results for input(s): TSH, T4TOTAL, T3FREE, THYROIDAB in the last 72 hours.  Invalid input(s): FREET3 ------------------------------------------------------------------------------------------------------------------ No results for input(s): VITAMINB12, FOLATE, FERRITIN, TIBC, IRON, RETICCTPCT in the last 72 hours.  Coagulation profile Recent Labs  Lab 01/13/18 1647  INR 1.32    No results for input(s): DDIMER in the last 72 hours.  Cardiac Enzymes No results for input(s): CKMB, TROPONINI, MYOGLOBIN in the last 168 hours.  Invalid input(s): CK ------------------------------------------------------------------------------------------------------------------    Component Value Date/Time   BNP 49.6 08/28/2017 7829    Micro Results Recent Results (from the past 240 hour(s))  Blood Culture (routine x 2)     Status: None (Preliminary result)   Collection Time: 01/13/18 11:17 AM  Result Value Ref Range Status   Specimen Description BLOOD BLOOD LEFT WRIST  Final   Special Requests   Final    BOTTLES DRAWN AEROBIC AND ANAEROBIC Blood Culture adequate volume   Culture   Final    NO GROWTH 3 DAYS Performed at [facility], [address], [place], [place] [address]    Report Status PENDING  Incomplete  Blood Culture (routine x 2)     Status: None (Preliminary result)   Collection Time: 01/13/18 11:22 AM  Result Value Ref Range Status   Specimen Description BLOOD RIGHT HAND  Final   Special Requests   Final    BOTTLES DRAWN AEROBIC AND ANAEROBIC Blood Culture adequate volume   Culture    Final    NO GROWTH 3 DAYS Performed at [facility], [address], [place], [place] [address]    Report Status PENDING  Incomplete  Respiratory Panel by PCR     Status: None   Collection Time: 01/13/18  2:49 PM  Result Value Ref Range Status   Adenovirus NOT DETECTED NOT DETECTED Final   Coronavirus 229E NOT DETECTED NOT DETECTED Final   Coronavirus HKU1 NOT DETECTED NOT DETECTED Final   Coronavirus NL63 NOT DETECTED NOT DETECTED Final   Coronavirus OC43 NOT DETECTED NOT DETECTED Final   Metapneumovirus NOT DETECTED NOT DETECTED Final   Rhinovirus / Enterovirus NOT DETECTED NOT DETECTED Final   Influenza A NOT DETECTED NOT DETECTED Final   Influenza B NOT DETECTED NOT DETECTED Final   Parainfluenza Virus 1 NOT DETECTED NOT DETECTED Final   Parainfluenza Virus 2 NOT DETECTED NOT  DETECTED Final   Parainfluenza Virus 3 NOT DETECTED NOT DETECTED Final   Parainfluenza Virus 4 NOT DETECTED NOT DETECTED Final   Respiratory Syncytial Virus NOT DETECTED NOT DETECTED Final   Bordetella pertussis NOT DETECTED NOT DETECTED Final   Chlamydophila pneumoniae NOT DETECTED NOT DETECTED Final   Mycoplasma pneumoniae NOT DETECTED NOT DETECTED Final  MRSA PCR Screening     Status: None   Collection Time: 01/13/18  5:32 PM  Result Value Ref Range Status   MRSA by PCR NEGATIVE NEGATIVE Final    Comment:        The GeneXpert MRSA Assay (FDA approved for NASAL specimens only), is one component of a comprehensive MRSA colonization surveillance program. It is not intended to diagnose MRSA infection nor to guide or monitor treatment for MRSA infections. Performed at [facility], [address], [place], [place] [address]     Radiology Reports Dg Chest Port 1 View  Result Date: 01/14/2018 CLINICAL DATA:  Pneumonia. EXAM: PORTABLE CHEST 1 VIEW COMPARISON:  Chest x-ray from yesterday. FINDINGS: The heart size and mediastinal contours are within normal limits. Normal pulmonary  vascularity. Unchanged somewhat linear opacities at the left lung base. No pleural effusion or pneumothorax. No acute osseous abnormality. IMPRESSION: Unchanged left lower lobe atelectasis versus infiltrate. Electronically Signed   By: Obie Dredge M.D.   On: 01/14/2018 07:25   Dg Chest Port 1 View  Result Date: 01/13/2018 CLINICAL DATA:  Headache, shortness of breath and productive cough. EXAM: PORTABLE CHEST 1 VIEW COMPARISON:  08/28/2017, chest CT FINDINGS: Cardiomediastinal silhouette is normal. Mediastinal contours appear intact. There is no pleural effusion or pneumothorax. Peribronchial airspace consolidation in the left lower thorax. Osseous structures are without acute abnormality. Soft tissues are grossly normal. Breast implants noted. IMPRESSION: Peribronchial airspace consolidation the left lower thorax may represent atelectasis or early infectious consolidation. Electronically Signed   By: Ted Mcalpine M.D.   On: 01/13/2018 09:56    Time Spent in minutes  30   Susa Raring M.D on 01/17/2018 at 11:33 AM  To page go to [email or web address] - password [email or web address]

## 2018-01-17 NOTE — Clinical Social Work Note (Signed)
Clinical Social Work Assessment  Patient Details  Name: Anne Black MRN: [id number] Date of Birth: 1961/01/05  Date of referral:  01/17/18               Reason for consult:  Facility Placement                Permission sought to share information with:  Facility Medical sales representative, Family Supports Permission granted to share information::  Yes, Verbal Permission Granted  Name::        Agency::  SNFs  Relationship::     Contact Information:     Housing/Transportation Living arrangements for the past 2 months:  Single Family Home Source of Information:  Patient Patient Interpreter Needed:  None Criminal Activity/Legal Involvement Pertinent to Current Situation/Hospitalization:  No - Comment as needed Significant Relationships:  Parents Lives with:  Self Do you feel safe going back to the place where you live?  No Need for family participation in patient care:  No (Coment)  Care giving concerns:  CSW received consult for possible SNF placement at time of discharge. CSW spoke with patient and her mother regarding PT recommendation of SNF placement at time of discharge. Patient reported that she would like to return to ETOH rehab at [facility]. [facility] in [place] [phone]). Patient has a high number of hospital admissions due to alcohol use but is only able to receive short term rehab since she does not have Medicaid. CSW to continue to follow and assist with discharge planning needs.   Social Worker assessment / plan:  CSW spoke with patient and her mother concerning possibility of rehab at [facility] before returning home.  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 She reports understanding that she needs to apply for Medicaid if she wants long term placement at an Assisted Living or similar facility. Patient has been to several SNFs before and has had issues with alcohol at the facilities so CSW will send out referral to facilities that accept [employer] to see who is able to accept patient. Facility representative will assess patient for compliance with not having alcohol or smoking at the facility and begin insurance authorization if they can accept patient. Patient reports that she would like to quit drinking but she recently just got out of alcohol rehab. She is aware of resources available and wants to discharge to [facility].    Patient/Family's Understanding of and Emotional Response to Diagnosis, Current Treatment, and Prognosis:  Patient/family is realistic regarding therapy needs and expressed being hopeful for return to [facility]. Patient expressed understanding of CSW role and discharge process as well as medical condition. No questions/concerns about plan or treatment.    Emotional Assessment Appearance:  Appears stated age Attitude/Demeanor/Rapport:  Complaining Affect (typically observed):  Accepting, Appropriate Orientation:  Oriented to Self Alcohol / Substance use:  Not Applicable Psych involvement (Current and /or in the community):  No (Comment)  Discharge Needs  Concerns to be addressed:  Care Coordination Readmission within the last 30 days:  No Current discharge risk:  None Barriers to Discharge:  Continued Medical Work up   [employer], LCSW 01/17/2018, 5:12 PM

## 2018-01-18 LAB — COMPREHENSIVE METABOLIC PANEL
ALT: 22 U/L (ref 0–44)
AST: 26 U/L (ref 15–41)
Albumin: 2.6 g/dL — ABNORMAL LOW (ref 3.5–5.0)
Alkaline Phosphatase: 62 U/L (ref 38–126)
Anion gap: 8 (ref 5–15)
BUN: 9 mg/dL (ref 6–20)
CO2: 29 mmol/L (ref 22–32)
Calcium: 8.2 mg/dL — ABNORMAL LOW (ref 8.9–10.3)
Chloride: 101 mmol/L (ref 98–111)
Creatinine, Ser: 0.4 mg/dL — ABNORMAL LOW (ref 0.44–1.00)
GFR calc Af Amer: >60 mL/min (ref >60)
GFR calc non Af Amer: >60 mL/min (ref >60)
Glucose, Bld: 94 mg/dL (ref 70–99)
Potassium: 2.9 mmol/L — ABNORMAL LOW (ref 3.5–5.1)
Sodium: 138 mmol/L (ref 135–145)
TOTAL PROTEIN: 4.7 g/dL — AB (ref 6.5–8.1)
Total Bilirubin: 1.6 mg/dL — ABNORMAL HIGH (ref 0.3–1.2)

## 2018-01-18 LAB — CBC
HCT: 35.1 % — ABNORMAL LOW (ref 36.0–46.0)
Hemoglobin: 10.5 g/dL — ABNORMAL LOW (ref 12.0–15.0)
MCH: 26.4 pg (ref 26.0–34.0)
MCHC: 29.9 g/dL — ABNORMAL LOW (ref 30.0–36.0)
MCV: 88.2 fL (ref 80.0–100.0)
PLATELETS: 91 10*3/uL — AB (ref 150–400)
RBC: 3.98 MIL/uL (ref 3.87–5.11)
RDW: 20.9 % — ABNORMAL HIGH (ref 11.5–15.5)
WBC: 6.2 10*3/uL (ref 4.0–10.5)
nRBC: 0 % (ref 0.0–0.2)

## 2018-01-18 LAB — GLUCOSE, CAPILLARY
Glucose-Capillary: 100 mg/dL — ABNORMAL HIGH (ref 70–99)
Glucose-Capillary: 101 mg/dL — ABNORMAL HIGH (ref 70–99)
Glucose-Capillary: 104 mg/dL — ABNORMAL HIGH (ref 70–99)
Glucose-Capillary: 119 mg/dL — ABNORMAL HIGH (ref 70–99)
Glucose-Capillary: 122 mg/dL — ABNORMAL HIGH (ref 70–99)
Glucose-Capillary: 230 mg/dL — ABNORMAL HIGH (ref 70–99)
Glucose-Capillary: 93 mg/dL (ref 70–99)

## 2018-01-18 LAB — CULTURE, BLOOD (ROUTINE X 2)
Culture: NO GROWTH
Culture: NO GROWTH
SPECIAL REQUESTS: ADEQUATE
Special Requests: ADEQUATE

## 2018-01-18 LAB — AMMONIA: Ammonia: 54 umol/L — ABNORMAL HIGH (ref 9–35)

## 2018-01-18 LAB — MAGNESIUM: Magnesium: 1.9 mg/dL (ref 1.7–2.4)

## 2018-01-18 MED ORDER — POTASSIUM CHLORIDE 10 MEQ/100ML IV SOLN
10.0000 meq | INTRAVENOUS | Status: AC
  Administered 2018-01-18: 10 meq via INTRAVENOUS
  Filled 2018-01-18: qty 100

## 2018-01-18 MED ORDER — LACTULOSE 10 GM/15ML PO SOLN
30.0000 g | Freq: Three times a day (TID) | ORAL | Status: DC
  Filled 2018-01-18 (×4): qty 45

## 2018-01-18 MED ORDER — POTASSIUM CHLORIDE 10 MEQ/100ML IV SOLN
10.0000 meq | INTRAVENOUS | Status: AC
  Administered 2018-01-18 (×3): 10 meq via INTRAVENOUS
  Filled 2018-01-18 (×3): qty 100

## 2018-01-18 MED ORDER — POTASSIUM CHLORIDE CRYS ER 20 MEQ PO TBCR
40.0000 meq | EXTENDED_RELEASE_TABLET | Freq: Once | ORAL | Status: AC
  Administered 2018-01-18: 40 meq via ORAL
  Filled 2018-01-18: qty 2

## 2018-01-18 NOTE — Progress Notes (Signed)
Per patient request (and her mother) CSW contacted Gregary Signs with [facility] [phone] option 3). Gregary Signs stated that they are able to accept the patient and can coordinate to come pick her up from the hospital but they first want the hospital RN to speak with their RN to see if patient is up to their program medically. Patient has had to go to the hospital in the past while being at [facility] so they want to make sure she does not need therapy rehab before going to their program. CSW provided RN number [phone]) for nurse to nurse review and Gregary Signs will contact CSW back with determination.   Anne Black Estephany Perot LCSW [phone]

## 2018-01-18 NOTE — Progress Notes (Signed)
PROGRESS NOTE                                                                                                                                                                                                             Patient Demographics:    Anne Black, is a 57 y.o. female, DOB - 1960-10-09, FEO:[id number]  Admit date - 01/13/2018   Admitting Physician Leslye Peer, MD  Outpatient Primary MD for the patient is Doristine Bosworth, MD  LOS - 5  Chief Complaint  Patient presents with  . Migraine  . Shortness of Breath       Brief Narrative 57 year old white female with a significant history of daily alcohol use, tobacco abuse, and COPD.  Just finished rehab less than 1 month ago, remained sober for approximately 1 week, now back to drinking daily.  Was in her usual state of health up until 12/5 afternoon when she developed headache, sore throat, wet nonproductive cough, worsening wheezing, chest tightness, and shortness of breath, she was admitted to critical care unit for sepsis and aspiration pneumonia.   Subjective:   Patient in bed, appears comfortable, denies any headache, no fever, no chest pain or pressure, no shortness of breath , no abdominal pain. No focal weakness.   Assessment  & Plan :     1.  Acute hypoxic respiratory failure, sepsis due to aspiration pneumonia.  She was initially treated in ICU, has finished antibiotic treatment, sepsis pathophysiology has resolved, advance activity encouraged to sit up, flutter valve added for pulmonary toiletry, try to titrate off oxygen.  If stable discharge to SNF likely tomorrow.  2.  History of alcohol abuse, alcoholic cirrhosis with thrombocytopenia and ascites.  On CIWA protocol, DTs seems to have resolved, as needed Ativan, stop Librium, PRN Haldol.  Continue folic acid thiamine.  3.  COPD with smoking.  No acute issues counseled to quit smoking.  Supportive care for COPD.  4.  Chronic macrocytic anemia due to  alcohol abuse.  Stable monitor.  5.  Mild metabolic encephalopathy.  Due to DTs, monitor with supportive care, will also keep an eye on ammonia levels due to underlying cirrhosis, repeat ammonia levels tomorrow.  Increased lactulose dose for better control.  6.  GERD.  On PPI continue.  7.  Hypokalemia.  Replaced IV and p.o. will monitor.    Family Communication  :  none  Code Status :  Full  Disposition Plan  :  Med  Consults  :  PCCM  Procedures  :    DVT Prophylaxis  :    Heparin   Lab Results  Component Value Date   PLT 91 (L) 01/18/2018    Diet :  Diet Order            Diet regular Room service appropriate? Yes; Fluid consistency: Thin  Diet effective now               Inpatient Medications Scheduled Meds: . arformoterol  15 mcg Nebulization BID  . budesonide (PULMICORT) nebulizer solution  0.25 mg Nebulization BID  . FLUoxetine  20 mg Oral Daily  . folic acid  1 mg Oral Daily  . gabapentin  200 mg Oral TID  . heparin  5,000 Units Subcutaneous Q8H  . insulin aspart  0-15 Units Subcutaneous Q4H  . ipratropium-albuterol  3 mL Nebulization Q6H  . lactulose  20 g Oral BID  . nicotine  14 mg Transdermal Daily  . pantoprazole  40 mg Oral Daily  . potassium chloride  40 mEq Oral Once  . thiamine  100 mg Oral Daily   Continuous Infusions: . potassium chloride     PRN Meds:.albuterol, haloperidol lactate, LORazepam, ondansetron  Antibiotics  :   Anti-infectives (From admission, onward)   Start     Dose/Rate Route Frequency Ordered Stop   01/15/18 1200  amoxicillin-clavulanate (AUGMENTIN) 875-125 MG per tablet 1 tablet  Status:  Discontinued     1 tablet Oral Every 12 hours 01/15/18 1101 01/17/18 1132   01/13/18 1130  cefTRIAXone (ROCEPHIN) 2 g in sodium chloride 0.9 % 100 mL IVPB  Status:  Discontinued     2 g 200 mL/hr over 30 Minutes Intravenous Every 24 hours 01/13/18 1117 01/15/18 1101   01/13/18 1130  azithromycin (ZITHROMAX) 500 mg in sodium  chloride 0.9 % 250 mL IVPB  Status:  Discontinued     500 mg 250 mL/hr over 60 Minutes Intravenous Every 24 hours 01/13/18 1117 01/15/18 1101          Objective:   Vitals:   01/17/18 2106 01/18/18 0456 01/18/18 0500 01/18/18 0848  BP: 123/68 113/64    Pulse: (!) 102 83    Resp: 16 18    Temp: 98.1 F (36.7 C) 97.7 F (36.5 C)    TempSrc: Oral Oral    SpO2: 93% 93%  96%  Weight:   71.9 kg   Height:        Wt Readings from Last 3 Encounters:  01/18/18 71.9 kg  01/10/18 81.6 kg  11/17/17 68 kg     Intake/Output Summary (Last 24 hours) at 01/18/2018 1024 Last data filed at 01/18/2018 0938 Gross per 24 hour  Intake 720 ml  Output 500 ml  Net 220 ml     Physical Exam  Awake , No new F.N deficits, Normal affect [place].AT,PERRAL Supple Neck,No JVD, No cervical lymphadenopathy appriciated.  Symmetrical Chest wall movement, Good air movement bilaterally, CTAB RRR,No Gallops, Rubs or new Murmurs, No Parasternal Heave +ve B.Sounds, Abd Soft, No tenderness, No organomegaly appriciated, No rebound - guarding or rigidity. No Cyanosis, Clubbing or edema, No new Rash or bruise      Data Review:    CBC Recent Labs  Lab 01/13/18 0950 01/14/18 0328 01/16/18 0755 01/18/18 0506  WBC 15.0* 9.3 8.8 6.2  HGB 11.5* 9.8* 11.2* 10.5*  HCT 37.3 32.9* 36.3 35.1*  PLT 135* 99* 101* 91*  MCV 87.4 88.4 87.3 88.2  MCH 26.9 26.3 26.9 26.4  MCHC 30.8 29.8* 30.9 29.9*  RDW 20.5* 20.4* 20.5* 20.9*  LYMPHSABS 0.6*  --   --   --   MONOABS 0.3  --   --   --   EOSABS 0.0  --   --   --   BASOSABS 0.0  --   --   --     Chemistries  Recent Labs  Lab 01/13/18 0950 01/13/18 1647 01/14/18 0328 01/16/18 0755 01/18/18 0506  NA 141  --  138 137 138  K 3.3*  --  4.2 3.7 2.9*  CL 97*  --  100 98 101  CO2 28  --  28 29 29   GLUCOSE 153*  --  135* 114* 94  BUN <5*  --  7 7 9   CREATININE 0.50  --  0.47 0.58 0.40*  CALCIUM 8.8*  --  8.5* 8.6* 8.2*  MG 2.1 1.7 1.7 1.9 1.9  AST  --   --    --  18 26  ALT  --   --   --  15 22  ALKPHOS  --   --   --  85 62  BILITOT  --   --   --  1.2 1.6*   ------------------------------------------------------------------------------------------------------------------ No results for input(s): CHOL, HDL, LDLCALC, TRIG, CHOLHDL, LDLDIRECT in the last 72 hours.  Lab Results  Component Value Date   HGBA1C 5.1 01/13/2018   ------------------------------------------------------------------------------------------------------------------ No results for input(s): TSH, T4TOTAL, T3FREE, THYROIDAB in the last 72 hours.  Invalid input(s): FREET3 ------------------------------------------------------------------------------------------------------------------ No results for input(s): VITAMINB12, FOLATE, FERRITIN, TIBC, IRON, RETICCTPCT in the last 72 hours.  Coagulation profile Recent Labs  Lab 01/13/18 1647  INR 1.32    No results for input(s): DDIMER in the last 72 hours.  Cardiac Enzymes No results for input(s): CKMB, TROPONINI, MYOGLOBIN in the last 168 hours.  Invalid input(s): CK ------------------------------------------------------------------------------------------------------------------    Component Value Date/Time   BNP 49.6 08/28/2017 1610    Micro Results Recent Results (from the past 240 hour(s))  Blood Culture (routine x 2)     Status: None (Preliminary result)   Collection Time: 01/13/18 11:17 AM  Result Value Ref Range Status   Specimen Description BLOOD BLOOD LEFT WRIST  Final   Special Requests   Final    BOTTLES DRAWN AEROBIC AND ANAEROBIC Blood Culture adequate volume   Culture   Final    NO GROWTH 4 DAYS Performed at [facility], [address], [place], [place] [address]    Report Status PENDING  Incomplete  Blood Culture (routine x 2)     Status: None (Preliminary result)   Collection Time: 01/13/18 11:22 AM  Result Value Ref Range Status   Specimen Description BLOOD RIGHT HAND  Final    Special Requests   Final    BOTTLES DRAWN AEROBIC AND ANAEROBIC Blood Culture adequate volume   Culture   Final    NO GROWTH 4 DAYS Performed at [facility], [address], [place], [place] [address]    Report Status PENDING  Incomplete  Respiratory Panel by PCR     Status: None   Collection Time: 01/13/18  2:49 PM  Result Value Ref Range Status   Adenovirus NOT DETECTED NOT DETECTED Final   Coronavirus 229E NOT DETECTED NOT DETECTED Final   Coronavirus HKU1 NOT DETECTED NOT DETECTED Final   Coronavirus NL63 NOT DETECTED NOT DETECTED Final   Coronavirus OC43 NOT DETECTED NOT DETECTED Final   Metapneumovirus NOT DETECTED NOT  DETECTED Final   Rhinovirus / Enterovirus NOT DETECTED NOT DETECTED Final   Influenza A NOT DETECTED NOT DETECTED Final   Influenza B NOT DETECTED NOT DETECTED Final   Parainfluenza Virus 1 NOT DETECTED NOT DETECTED Final   Parainfluenza Virus 2 NOT DETECTED NOT DETECTED Final   Parainfluenza Virus 3 NOT DETECTED NOT DETECTED Final   Parainfluenza Virus 4 NOT DETECTED NOT DETECTED Final   Respiratory Syncytial Virus NOT DETECTED NOT DETECTED Final   Bordetella pertussis NOT DETECTED NOT DETECTED Final   Chlamydophila pneumoniae NOT DETECTED NOT DETECTED Final   Mycoplasma pneumoniae NOT DETECTED NOT DETECTED Final  MRSA PCR Screening     Status: None   Collection Time: 01/13/18  5:32 PM  Result Value Ref Range Status   MRSA by PCR NEGATIVE NEGATIVE Final    Comment:        The GeneXpert MRSA Assay (FDA approved for NASAL specimens only), is one component of a comprehensive MRSA colonization surveillance program. It is not intended to diagnose MRSA infection nor to guide or monitor treatment for MRSA infections. Performed at [facility], [address], [place], [place] [address]     Radiology Reports Dg Chest Port 1 View  Result Date: 01/14/2018 CLINICAL DATA:  Pneumonia. EXAM: PORTABLE CHEST 1 VIEW COMPARISON:  Chest x-ray from  yesterday. FINDINGS: The heart size and mediastinal contours are within normal limits. Normal pulmonary vascularity. Unchanged somewhat linear opacities at the left lung base. No pleural effusion or pneumothorax. No acute osseous abnormality. IMPRESSION: Unchanged left lower lobe atelectasis versus infiltrate. Electronically Signed   By: Obie Dredge M.D.   On: 01/14/2018 07:25   Dg Chest Port 1 View  Result Date: 01/13/2018 CLINICAL DATA:  Headache, shortness of breath and productive cough. EXAM: PORTABLE CHEST 1 VIEW COMPARISON:  08/28/2017, chest CT FINDINGS: Cardiomediastinal silhouette is normal. Mediastinal contours appear intact. There is no pleural effusion or pneumothorax. Peribronchial airspace consolidation in the left lower thorax. Osseous structures are without acute abnormality. Soft tissues are grossly normal. Breast implants noted. IMPRESSION: Peribronchial airspace consolidation the left lower thorax may represent atelectasis or early infectious consolidation. Electronically Signed   By: Ted Mcalpine M.D.   On: 01/13/2018 09:56    Time Spent in minutes  30   Susa Raring M.D on 01/18/2018 at 10:24 AM  To page go to [email or web address] - password [facility]

## 2018-01-19 DIAGNOSIS — F419 Anxiety disorder, unspecified: Secondary | ICD-10-CM

## 2018-01-19 DIAGNOSIS — F319 Bipolar disorder, unspecified: Secondary | ICD-10-CM

## 2018-01-19 LAB — BASIC METABOLIC PANEL
Anion gap: 7 (ref 5–15)
BUN: 5 mg/dL — ABNORMAL LOW (ref 6–20)
CALCIUM: 8.1 mg/dL — AB (ref 8.9–10.3)
CO2: 29 mmol/L (ref 22–32)
Chloride: 102 mmol/L (ref 98–111)
Creatinine, Ser: 0.53 mg/dL (ref 0.44–1.00)
GFR calc non Af Amer: >60 mL/min (ref >60)
Glucose, Bld: 109 mg/dL — ABNORMAL HIGH (ref 70–99)
Potassium: 3.6 mmol/L (ref 3.5–5.1)
Sodium: 138 mmol/L (ref 135–145)

## 2018-01-19 LAB — MAGNESIUM: MAGNESIUM: 1.8 mg/dL (ref 1.7–2.4)

## 2018-01-19 LAB — GLUCOSE, CAPILLARY
Glucose-Capillary: 103 mg/dL — ABNORMAL HIGH (ref 70–99)
Glucose-Capillary: 97 mg/dL (ref 70–99)
Glucose-Capillary: 95 mg/dL (ref 70–99)
Glucose-Capillary: 116 mg/dL — ABNORMAL HIGH (ref 70–99)
Glucose-Capillary: 103 mg/dL — ABNORMAL HIGH (ref 70–99)

## 2018-01-19 LAB — AMMONIA: Ammonia: 56 umol/L — ABNORMAL HIGH (ref 9–35)

## 2018-01-19 MED ORDER — POTASSIUM CHLORIDE CRYS ER 20 MEQ PO TBCR
40.0000 meq | EXTENDED_RELEASE_TABLET | Freq: Once | ORAL | Status: AC
  Administered 2018-01-19: 40 meq via ORAL
  Filled 2018-01-19: qty 2

## 2018-01-19 MED ORDER — FUROSEMIDE 10 MG/ML IJ SOLN
40.0000 mg | Freq: Once | INTRAMUSCULAR | Status: AC
  Administered 2018-01-19: 40 mg via INTRAVENOUS
  Filled 2018-01-19: qty 4

## 2018-01-19 MED ORDER — IPRATROPIUM-ALBUTEROL 0.5-2.5 (3) MG/3ML IN SOLN: 3.0000 mL | Freq: Four times a day (QID) | RESPIRATORY_TRACT | Status: DC | PRN

## 2018-01-19 MED ORDER — NYSTATIN 100000 UNIT/GM EX POWD
Freq: Three times a day (TID) | CUTANEOUS | Status: DC
  Administered 2018-01-19 (×2): via TOPICAL
  Filled 2018-01-19: qty 15

## 2018-01-19 NOTE — Progress Notes (Signed)
Pt c/o SOB and dyspnea. Pt stated that she takes predinisone at home and needs some now. MD paged and advised not to give prednisone because it will suppress her immune system. Pt educated. Gave PRN ativan d/t pt being extremely anxious. Will continue to monitor.

## 2018-01-19 NOTE — Progress Notes (Signed)
Patient continues to refuse to take lactulose. She states, "I go to the bathroom enough, I don't need that to make me poop more." Patient educated on medication and benefits of taking but patient continues to refuse.

## 2018-01-19 NOTE — Consult Note (Signed)
[facility] Face-to-Face Psychiatry Consult   Reason for Consult:  Bipolar disorder and alcohol abuse  Referring Physician:  Dr. Thedore Mins [name] MRN:  [id number] Principal Diagnosis: Alcohol use disorder, severe, dependence (HCC) Diagnosis:  Principal Problem:   Severe sepsis (HCC) Active Problems:   Tobacco abuse   Alcohol withdrawal (HCC)   Macrocytic anemia- due to alcohol abuse with normal B12 & folate levels   Acute respiratory failure (HCC)   Ascites due to alcoholic cirrhosis (HCC)   Acute alcoholism (HCC)   Thrombocytopenia (HCC)   COPD (chronic obstructive pulmonary disease) (HCC)   Total Time spent with patient: 1 hour  Subjective:   Anne Black is a 57 y.o. female patient admitted with acute hypoxic respiratory failure and sepsis secondary to aspiration pneumonia.  HPI:   Per chart review, patient was admitted with acute hypoxic respiratory failure and sepsis secondary to aspiration pneumonia. She has a history of bipolar disorder and alcohol abuse. She completed rehab a month prior to hospitalization. She relapsed a week after she was discharged. She is recommended for SNF placement or home with 24 hour supervision. Per primary team, patient would like to go to her mother's home but her mother reports that she is afraid for her safety. She reports that the patient has been violent in the past. Home medications include Prozac 20 mg daily, Gabapentin 200 mg TID and Risperdal 0.5 mg TID PRN.   On interview, Ms. Gazzola denies SI, HI or AVH.  She denies problems with her mood.  She denies a history of manic symptoms (decreased need for sleep, increased energy, pressured speech or euphoria).  She denies problems with appetite or sleep.  She reports feeling better since admission to the hospital.  She reports becoming easily fatigued and she plans on hiring people to clean her home.  She reports that she is dependent of her ADLs.  She reports recently relapsing  on alcohol a week after completing rehab in [place].  She has been drinking a few glasses of wine daily for the past week.  She reports drinking less than 1 bottle of wine daily.  She denies a history of DTs or seizures.  She reports being sober for several years until relapsing 2 years ago.  She plans to complete IOP.  She reports that her current medications were prescribed by a provider in [place].  She will need a psychiatrist for medication management in [place].  She requests Vyvanse for lethargy.  She was informed that she will have to establish care with a psychiatrist who can determine if Vyvanse is appropriate for her.  Past Psychiatric History: Bipolar disorder, anxiety, ADHD and alcohol abuse   Risk to Self:  None. Denies SI.  Risk to Others:  None. Denies HI.  Prior Inpatient Therapy:  She was hospitalized multiple times for detox.  Prior Outpatient Therapy:  Denies   Past Medical History:  Past Medical History:  Diagnosis Date  . ADHD (attention deficit hyperactivity disorder)   . Alcoholism (HCC)   . Anxiety   . Arthritis   . Ascites   . Bipolar disorder (HCC)   . Cirrhosis (HCC)   . COPD (chronic obstructive pulmonary disease) (HCC)   . Hypokalemia   . Migraine   . Multiple sclerosis (HCC)   . Narcolepsy   . Osteoporosis     Past Surgical History:  Procedure Laterality Date  . ABDOMINAL WALL DEFECT REPAIR N/A 12/30/2016   Procedure: EXPLORATORY LAPAROTOMY WITH REPAIR ABDOMINAL  WALL VENTRAL HERNIA;  Surgeon: [name], MD;  Location: [facility] OR;  Service: General;  Laterality: N/A;  . APPLICATION OF WOUND VAC N/A 12/30/2016   Procedure: APPLICATION OF WOUND VAC;  Surgeon: [name], MD;  Location: [facility] OR;  Service: General;  Laterality: N/A;  . CESAREAN SECTION  [phone]  . FRACTURE SURGERY    . HERNIA REPAIR    . MYRINGOTOMY WITH TUBE PLACEMENT Bilateral   . ORIF WRIST FRACTURE Left 09/09/2016   Procedure: OPEN REDUCTION INTERNAL FIXATION (ORIF) LEFT  WRIST FRACTURE, LEFT CARPAL TUNNEL RELEASE;  Surgeon: Dominica Severin, MD;  Location: [facility] ORS;  Service: Orthopedics;  Laterality: Left;  . TONSILLECTOMY     Family History:  Family History  Problem Relation Age of Onset  . Arrhythmia Mother   . Heart disease Father   . Hypertension Father    Family Psychiatric  History: Father-alcoholism  Social History:  Social History   Substance and Sexual Activity  Alcohol Use Not Currently     Social History   Substance and Sexual Activity  Drug Use No    Social History   Socioeconomic History  . Marital status: Divorced    Spouse name: n/a  . Number of children: 5  . Years of education: College  . Highest education level: Not on file  Occupational History  . Occupation: disabled    Comment: on social security disability for MS  Social Needs  . Financial resource strain: Somewhat hard  . Food insecurity:    Worry: Never true    Inability: Never true  . Transportation needs:    Medical: No    Non-medical: No  Tobacco Use  . Smoking status: Current Every Day Smoker    Packs/day: 1.00    Years: 35.00    Pack years: 35.00    Types: Cigarettes  . Smokeless tobacco: Never Used  Substance and Sexual Activity  . Alcohol use: Not Currently  . Drug use: No  . Sexual activity: Not Currently  Lifestyle  . Physical activity:    Days per week: 0 days    Minutes per session: 0 min  . Stress: Rather much  Relationships  . Social connections:    Talks on phone: Three times a week    Gets together: Three times a week    Attends religious service: Never    Active member of club or organization: No    Attends meetings of clubs or organizations: Never    Relationship status: Divorced  Other Topics Concern  . Not on file  Social History Narrative   Former Environmental consultant.   Started graduate school in counseling, but didn't finish when a good job came open.   Lives alone.   One daughter lives in [place].   Another  daughter is in [place]    The other three live in [place], along with her mother.   Father lives in the [place], [place] area.   Additional Social History: She lives alone. She has 5 adult children. She receives disability for multiple sclerosis.     Allergies:  No Known Allergies  Labs:  Results for orders placed or performed during the hospital encounter of 01/13/18 (from the past 48 hour(s))  Glucose, capillary     Status: Abnormal   Collection Time: 01/17/18 12:22 PM  Result Value Ref Range   Glucose-Capillary 113 (H) 70 - 99 mg/dL  Glucose, capillary     Status: Abnormal   Collection Time: 01/17/18  4:33 PM  Result  Value Ref Range   Glucose-Capillary 130 (H) 70 - 99 mg/dL  Glucose, capillary     Status: Abnormal   Collection Time: 01/17/18  8:35 PM  Result Value Ref Range   Glucose-Capillary 128 (H) 70 - 99 mg/dL  Glucose, capillary     Status: None   Collection Time: 01/18/18 12:08 AM  Result Value Ref Range   Glucose-Capillary 93 70 - 99 mg/dL  Glucose, capillary     Status: Abnormal   Collection Time: 01/18/18  4:54 AM  Result Value Ref Range   Glucose-Capillary 100 (H) 70 - 99 mg/dL  CBC     Status: Abnormal   Collection Time: 01/18/18  5:06 AM  Result Value Ref Range   WBC 6.2 4.0 - 10.5 K/uL   RBC 3.98 3.87 - 5.11 MIL/uL   Hemoglobin 10.5 (L) 12.0 - 15.0 g/dL   HCT 04.5 (L) 40.9 - 81.1 %   MCV 88.2 80.0 - 100.0 fL   MCH 26.4 26.0 - 34.0 pg   MCHC 29.9 (L) 30.0 - 36.0 g/dL   RDW 91.4 (H) 78.2 - 95.6 %   Platelets 91 (L) 150 - 400 K/uL    Comment: REPEATED TO VERIFY Immature Platelet Fraction may be clinically indicated, consider ordering this additional test [id number] CONSISTENT WITH PREVIOUS RESULT    nRBC 0.0 0.0 - 0.2 %    Comment: Performed at [facility], [address], [place], [place] [address]  Comprehensive metabolic panel     Status: Abnormal   Collection Time: 01/18/18  5:06 AM  Result Value Ref Range   Sodium 138 135 - 145 mmol/L    Potassium 2.9 (L) 3.5 - 5.1 mmol/L   Chloride 101 98 - 111 mmol/L   CO2 29 22 - 32 mmol/L   Glucose, Bld 94 70 - 99 mg/dL   BUN 9 6 - 20 mg/dL   Creatinine, Ser 2.95 (L) 0.44 - 1.00 mg/dL   Calcium 8.2 (L) 8.9 - 10.3 mg/dL   Total Protein 4.7 (L) 6.5 - 8.1 g/dL   Albumin 2.6 (L) 3.5 - 5.0 g/dL   AST 26 15 - 41 U/L   ALT 22 0 - 44 U/L   Alkaline Phosphatase 62 38 - 126 U/L   Total Bilirubin 1.6 (H) 0.3 - 1.2 mg/dL   GFR calc non Af Amer >60 >60 mL/min   GFR calc Af Amer >60 >60 mL/min   Anion gap 8 5 - 15    Comment: Performed at [facility], [address], [place], [place] [address]  Magnesium     Status: None   Collection Time: 01/18/18  5:06 AM  Result Value Ref Range   Magnesium 1.9 1.7 - 2.4 mg/dL    Comment: Performed at [facility], [address] [place], [place] [address]  Ammonia     Status: Abnormal   Collection Time: 01/18/18  5:06 AM  Result Value Ref Range   Ammonia 54 (H) 9 - 35 umol/L    Comment: Performed at [facility], [address], [place], [place] [address]  Glucose, capillary     Status: Abnormal   Collection Time: 01/18/18  7:42 AM  Result Value Ref Range   Glucose-Capillary 119 (H) 70 - 99 mg/dL  Glucose, capillary     Status: Abnormal   Collection Time: 01/18/18 12:36 PM  Result Value Ref Range   Glucose-Capillary 101 (H) 70 - 99 mg/dL  Glucose, capillary  Status: Abnormal   Collection Time: 01/18/18  4:49 PM  Result Value Ref Range   Glucose-Capillary 230 (H) 70 - 99 mg/dL  Glucose, capillary     Status: Abnormal   Collection Time: 01/18/18  7:52 PM  Result Value Ref Range   Glucose-Capillary 122 (H) 70 - 99 mg/dL  Glucose, capillary     Status: Abnormal   Collection Time: 01/18/18 11:02 PM  Result Value Ref Range   Glucose-Capillary 104 (H) 70 - 99 mg/dL  Glucose, capillary     Status: Abnormal   Collection Time: 01/19/18  3:47 AM  Result Value Ref Range   Glucose-Capillary 103 (H) 70 - 99 mg/dL  Ammonia      Status: Abnormal   Collection Time: 01/19/18  4:53 AM  Result Value Ref Range   Ammonia 56 (H) 9 - 35 umol/L    Comment: Performed at [facility], [address] [place], [place] [address]  Basic metabolic panel     Status: Abnormal   Collection Time: 01/19/18  4:53 AM  Result Value Ref Range   Sodium 138 135 - 145 mmol/L   Potassium 3.6 3.5 - 5.1 mmol/L   Chloride 102 98 - 111 mmol/L   CO2 29 22 - 32 mmol/L   Glucose, Bld 109 (H) 70 - 99 mg/dL   BUN 5 (L) 6 - 20 mg/dL   Creatinine, Ser 6.04 0.44 - 1.00 mg/dL   Calcium 8.1 (L) 8.9 - 10.3 mg/dL   GFR calc non Af Amer >60 >60 mL/min   GFR calc Af Amer >60 >60 mL/min   Anion gap 7 5 - 15    Comment: Performed at [facility], [address] [place], [place] [address]  Magnesium     Status: None   Collection Time: 01/19/18  4:53 AM  Result Value Ref Range   Magnesium 1.8 1.7 - 2.4 mg/dL    Comment: Performed at [facility], [address] [place], [place] [address]  Glucose, capillary     Status: None   Collection Time: 01/19/18  8:48 AM  Result Value Ref Range   Glucose-Capillary 97 70 - 99 mg/dL    Current Facility-Administered Medications  Medication Dose Route Frequency Provider Last Rate Last Dose  . albuterol (PROVENTIL) (2.5 MG/3ML) 0.083% nebulizer solution 2.5 mg  2.5 mg Nebulization Q2H PRN Simonne Martinet, NP   2.5 mg at 01/13/18 2055  . arformoterol (BROVANA) nebulizer solution 15 mcg  15 mcg Nebulization BID Campbell Riches, MD   15 mcg at 01/19/18 0843  . budesonide (PULMICORT) nebulizer solution 0.25 mg  0.25 mg Nebulization BID Campbell Riches, MD   0.25 mg at 01/19/18 0843  . FLUoxetine (PROZAC) capsule 20 mg  20 mg Oral Daily Simonne Martinet, NP   20 mg at 01/19/18 0855  . folic acid (FOLVITE) tablet 1 mg  1 mg Oral Daily Simonne Martinet, NP   1 mg at 01/19/18 0855  . gabapentin (NEURONTIN) capsule 200 mg  200 mg Oral TID Simonne Martinet, NP   200 mg at 01/19/18 0856  .  haloperidol lactate (HALDOL) injection 2 mg  2 mg Intravenous Q6H PRN Leroy Sea, MD      . heparin injection 5,000 Units  5,000 Units Subcutaneous Q8H Simonne Martinet, NP   5,000 Units at 01/19/18 [phone]  . insulin aspart (novoLOG) injection 0-15 Units  0-15 Units Subcutaneous Q4H Simonne Martinet, NP  5 Units at 01/18/18 1654  . ipratropium-albuterol (DUONEB) 0.5-2.5 (3) MG/3ML nebulizer solution 3 mL  3 mL Nebulization Q6H Simonne Martinet, NP   3 mL at 01/19/18 0843  . lactulose (CHRONULAC) 10 GM/15ML solution 30 g  30 g Oral TID Leroy Sea, MD      . LORazepam (ATIVAN) injection 1 mg  1 mg Intravenous Q4H PRN Leroy Sea, MD   1 mg at 01/18/18 2258  . nicotine (NICODERM CQ - dosed in mg/24 hours) patch 14 mg  14 mg Transdermal Daily Simonne Martinet, NP   14 mg at 01/19/18 0856  . ondansetron (ZOFRAN-ODT) disintegrating tablet 4 mg  4 mg Oral TID PRN Simonne Martinet, NP      . pantoprazole (PROTONIX) EC tablet 40 mg  40 mg Oral Daily Simonne Martinet, NP   40 mg at 01/19/18 0855  . thiamine (VITAMIN B-1) tablet 100 mg  100 mg Oral Daily Simonne Martinet, NP   100 mg at 01/19/18 1610    Musculoskeletal: Strength & Muscle Tone: within normal limits Gait & Station: UTA since patient is lying in bed. Patient leans: N/A  Psychiatric Specialty Exam: Physical Exam  Nursing note and vitals reviewed. Constitutional: She is oriented to person, place, and time. She appears well-developed and well-nourished.  HENT:  Head: Normocephalic and atraumatic.  Neck: Normal range of motion.  Respiratory: Effort normal.  Musculoskeletal: Normal range of motion.  Neurological: She is alert and oriented to person, place, and time.  Psychiatric: She has a normal mood and affect. Her speech is normal and behavior is normal. Judgment and thought content normal. Cognition and memory are normal.    Review of Systems  Respiratory: Positive for cough.   Cardiovascular: Negative for chest  pain.  Gastrointestinal: Positive for diarrhea. Negative for abdominal pain, heartburn, nausea and vomiting.  Psychiatric/Behavioral: Positive for substance abuse. Negative for depression, hallucinations and suicidal ideas. The patient does not have insomnia.   All other systems reviewed and are negative.   Blood pressure 107/60, pulse 82, temperature 98.5 F (36.9 C), temperature source Oral, resp. rate 18, height 5\' 2"  (1.575 m), weight 71.9 kg, SpO2 97 %.Body mass index is 28.99 kg/m.  General Appearance: Fairly Groomed, middle aged, Caucasian female, wearing a hospital gown with long hair, [place] in place who is lying in bed. NAD.   Eye Contact:  Good  Speech:  Clear and Coherent and Normal Rate  Volume:  Normal  Mood:  Euthymic  Affect:  Appropriate and Congruent  Thought Process:  Goal Directed, Linear and Descriptions of Associations: Intact  Orientation:  Full (Time, Place, and Person)  Thought Content:  Logical  Suicidal Thoughts:  No  Homicidal Thoughts:  No  Memory:  Immediate;   Good Recent;   Good Remote;   Good  Judgement:  Fair  Insight:  Fair  Psychomotor Activity:  Normal  Concentration:  Concentration: Good and Attention Span: Good  Recall:  Good  Fund of Knowledge:  Good  Language:  Good  Akathisia:  No  Handed:  Right  AIMS (if indicated):   N/A  Assets:  Communication Skills Desire for Improvement Financial Resources/Insurance Housing Resilience Social Support  ADL's:  Intact  Cognition:  WNL  Sleep:   Okay   Assessment:  Anne Black is a 57 y.o. female who was admitted with acute hypoxic respiratory failure and sepsis secondary to aspiration pneumonia. Psychiatry was asked to evaluate patient for active mood  symptoms and capacity evaluation to safely discharge home. She demonstrates capacity to make decision to discharge home. She is aware of her physical limitations and plans to hire additional help. She does not have active mood symptoms. Recommend  continuing her current medication regimen.    Treatment Plan Summary: -Continue Prozac 20 mg daily for mood and Gabapentin 200 mg TID for alcohol use. -Consider restarting Risperdal 0.5 mg TID PRN for agitation/anxiety.   -EKG reviewed and QTc 439 on 12/6. Please closely monitor when starting or increasing QTc prolonging agents.  -Patient has capacity to make decision to discharge home.  -Psychiatry will sign off on patient at this time. Please consult psychiatry again as needed.   Disposition: No evidence of imminent risk to self or others at present.   Patient does not meet criteria for psychiatric inpatient admission.  Cherly Beach, DO 01/19/2018 11:29 AM

## 2018-01-19 NOTE — Social Work (Signed)
Documentation requested by Tammy at [facility] (PT note; O2; continence) faxed to [phone].  Octavio Graves, MSW, [facility] Clinical Social Work [phone]

## 2018-01-19 NOTE — Progress Notes (Signed)
SATURATION QUALIFICATIONS: (This note is used to comply with regulatory documentation for home oxygen)  Patient Saturations on Room Air at Rest = 87%  Patient Saturations on Room Air while Ambulating = 85%  Patient Saturations on 2 Liters of oxygen while Ambulating = 93%

## 2018-01-19 NOTE — Progress Notes (Signed)
Patient continues progressing to goals. Patient has been oob to chair three times today. Tolerated walking 50 feet in hallway with stand by assistance and front wheeled walker. Will continue to monitor.

## 2018-01-19 NOTE — Progress Notes (Signed)
1:30pm-CSW and RNCM met with patient's mother, daughters, and ex-husband. They requested options for the patient. CSW explained that per MD, patient has capacity to make her own decisions and is able to leave if she chooses. They lamented difficulty of patient and state that they have tried to have her declared incompetent but that she "knows how to trick the system". CSW explained that if patient agrees to SNF, we can begin insurance authorization. [facility] liaison on standby if patient changes her mind.   11am-CSW received denial from [facility] due to patient being incontinent, requiring assistance and oxygen. Patient stated that she would like to go home. CSW attempted to point out patient's needs, which she denied. She requested the number for Agustina Caroli so she could withdrawal money for a cab. MD notified and spoke with patient, who agreed to stay in the hospital until tomorrow.

## 2018-01-19 NOTE — Progress Notes (Signed)
Pt continues to refuse lactulose. Pt states that she has been having diarrhea all day, and is tired of pooping. Pt educated on high ammonia levels and need for medication. Will continue to monitor.
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
## 2018-01-20 ENCOUNTER — Inpatient Hospital Stay (HOSPITAL_COMMUNITY): Payer: 59

## 2018-01-20 LAB — GLUCOSE, CAPILLARY
Glucose-Capillary: 103 mg/dL — ABNORMAL HIGH (ref 70–99)
Glucose-Capillary: 103 mg/dL — ABNORMAL HIGH (ref 70–99)
Glucose-Capillary: 94 mg/dL (ref 70–99)
Glucose-Capillary: 99 mg/dL (ref 70–99)

## 2018-01-20 MED ORDER — THIAMINE HCL 100 MG PO TABS: 100.0000 mg | ORAL_TABLET | Freq: Every day | ORAL | 0 refills | Status: DC

## 2018-01-20 MED ORDER — METHYLPREDNISOLONE SODIUM SUCC 125 MG IJ SOLR
60.0000 mg | INTRAMUSCULAR | Status: DC
  Filled 2018-01-20: qty 2

## 2018-01-20 MED ORDER — POTASSIUM CHLORIDE CRYS ER 20 MEQ PO TBCR
20.0000 meq | EXTENDED_RELEASE_TABLET | Freq: Once | ORAL | Status: AC
  Administered 2018-01-20: 20 meq via ORAL
  Filled 2018-01-20: qty 1

## 2018-01-20 MED ORDER — LACTULOSE 10 GM/15ML PO SOLN: 20.0000 g | Freq: Two times a day (BID) | ORAL | 0 refills | Status: DC

## 2018-01-20 MED ORDER — PREDNISONE 5 MG PO TABS: ORAL_TABLET | ORAL | 0 refills | Status: DC

## 2018-01-20 MED ORDER — IBUPROFEN 400 MG PO TABS
400.0000 mg | ORAL_TABLET | Freq: Once | ORAL | Status: AC
  Administered 2018-01-20: 400 mg via ORAL
  Filled 2018-01-20: qty 1

## 2018-01-20 MED ORDER — FUROSEMIDE 10 MG/ML IJ SOLN
40.0000 mg | Freq: Once | INTRAMUSCULAR | Status: AC
  Administered 2018-01-20: 40 mg via INTRAVENOUS
  Filled 2018-01-20: qty 4

## 2018-01-20 MED ORDER — PREDNISONE 50 MG PO TABS
50.0000 mg | ORAL_TABLET | Freq: Every day | ORAL | Status: DC
  Administered 2018-01-20: 50 mg via ORAL
  Filled 2018-01-20: qty 1

## 2018-01-20 MED ORDER — PREDNISONE 50 MG PO TABS: 50.0000 mg | ORAL_TABLET | Freq: Every day | ORAL | Status: DC

## 2018-01-20 MED ORDER — FOLIC ACID 1 MG PO TABS: 1.0000 mg | ORAL_TABLET | Freq: Every day | ORAL | 0 refills | Status: DC

## 2018-01-20 MED ORDER — IPRATROPIUM-ALBUTEROL 0.5-2.5 (3) MG/3ML IN SOLN: RESPIRATORY_TRACT | 0 refills | Status: DC

## 2018-01-20 NOTE — Progress Notes (Signed)
CSW spoke with patient again. She is still insistent on discharging home today. CSW provided contact information for [facility] in case she changes her mind at home. She is aware that insurance would have to approve the stay from home.    Pasrr: [id number] F expires 03/21/18  CSW signing off.   Osborne Casco Jairon Ripberger LCSW [phone]

## 2018-01-20 NOTE — Care Management Important Message (Signed)
Important Message  Patient Details  Name: Anne Black MRN: [id number] Date of Birth: 1960/03/03   Medicare Important Message Given:  Yes    Dorena Bodo 01/20/2018, 3:44 PM
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
## 2018-01-20 NOTE — Progress Notes (Signed)
Anne Black to be D/C'd Home per MD order.  Discussed with the patient and all questions fully answered.  VSS, Skin clean, dry and intact without evidence of skin break down, no evidence of skin tears noted.  IV catheter discontinued intact. Site without signs and symptoms of complications. Dressing and pressure applied.  An After Visit Summary was printed and given to the patient. Patient received prescription.  D/c education completed with patient/family including follow up instructions, medication list, d/c activities limitations if indicated, with other d/c instructions as indicated by MD - patient able to verbalize understanding, all questions fully answered.   Patient instructed to return to ED, call 911, or call MD for any changes in condition.   Patient escorted via [facility] by nurse tech, and D/C home via private auto.  Garry Heater 01/20/2018 5:06 PM

## 2018-01-20 NOTE — Progress Notes (Signed)
Pt resting. O2 weaned to 2L [place]. O2 sat 94% Will continue to monitor.

## 2018-01-20 NOTE — Discharge Instructions (Signed)
Follow with Primary MD Doristine Bosworth, MD in 3 days   Get CBC, CMP, 2 view Chest X ray -  checked  by Primary MD in 3 days    Activity: As tolerated with Full fall precautions use walker/cane & assistance as needed  Disposition Home    Diet: Heart Healthy   Special Instructions: If you have smoked or chewed Tobacco  in the last 2 yrs please stop smoking, stop any regular Alcohol  and or any Recreational drug use.  On your next visit with your primary care physician please Get Medicines reviewed and adjusted.  Please request your Prim.MD to go over all Hospital Tests and Procedure/Radiological results at the follow up, please get all Hospital records sent to your Prim MD by signing hospital release before you go home.  If you experience worsening of your admission symptoms, develop shortness of breath, life threatening emergency, suicidal or homicidal thoughts you must seek medical attention immediately by calling 911 or calling your MD immediately  if symptoms less severe.  You Must read complete instructions/literature along with all the possible adverse reactions/side effects for all the Medicines you take and that have been prescribed to you. Take any new Medicines after you have completely understood and accpet all the possible adverse reactions/side effects.   Do not drive, operate heavy machinery, perform activities at heights, swimming or participation in water activities or provide baby sitting services if your were admitted for syncope or siezures until you have seen by Primary MD or a Neurologist and advised to do so again.  Do not drive when taking Pain medications.  Do not take more than prescribed Pain, Sleep and Anxiety Medications  Wear Seat belts while driving.   Please note  You were cared for by a hospitalist during your hospital stay. If you have any questions about your discharge medications or the care you received while you were in the hospital after you are  discharged, you can call the unit and asked to speak with the hospitalist on call if the hospitalist that took care of you is not available. Once you are discharged, your primary care physician will handle any further medical issues. Please note that NO REFILLS for any discharge medications will be authorized once you are discharged, as it is imperative that you return to your primary care physician (or establish a relationship with a primary care physician if you do not have one) for your aftercare needs so that they can reassess your need for medications and monitor your lab values.

## 2018-01-23 ENCOUNTER — Ambulatory Visit: Payer: Self-pay | Admitting: *Deleted

## 2018-01-23 NOTE — Telephone Encounter (Signed)
Pt called with stating that she was in the hospital 01/13/18 - 01/20/18; she says the MD wanted her to go to a facility but she opted not to and chose to go home; she says that the hospital told her to come back and be readmitted; she said that she was also told she could go to her PCP and get overriding paperwork to be admitted to facility because she can not take care of herself at home; she was admitted to hospital pneumonia, weakness, shortness of breathing, and constant incontinence of her bowels; she is supposed to use oxygen but she does not want to open it since she is going to a facility; the pt is most concerned about bowel incontinence; offered to call EMS for pt but she declined; she will have someone take her to the hospital; the pt is normally seen by Dr Collie Siad, Ernesto Rutherford; will route to office for notification.  Reason for Disposition . Nursing judgment  Protocols used: NO GUIDELINE OR REFERENCE AVAILABLE-A-AH

## 2018-01-25 ENCOUNTER — Emergency Department (HOSPITAL_COMMUNITY)
Admission: EM | Admit: 2018-01-25 | Discharge: 2018-01-25 | Discharge status: Against Medical Advice | Payer: 59 | Attending: Emergency Medicine | Admitting: Emergency Medicine

## 2018-01-25 ENCOUNTER — Emergency Department (HOSPITAL_COMMUNITY): Payer: 59

## 2018-01-25 ENCOUNTER — Telehealth: Payer: Self-pay | Admitting: Family Medicine

## 2018-01-25 DIAGNOSIS — K7031 Alcoholic cirrhosis of liver with ascites: Secondary | ICD-10-CM | POA: Diagnosis not present

## 2018-01-25 DIAGNOSIS — F17210 Nicotine dependence, cigarettes, uncomplicated: Secondary | ICD-10-CM | POA: Insufficient documentation

## 2018-01-25 DIAGNOSIS — Z79899 Other long term (current) drug therapy: Secondary | ICD-10-CM | POA: Insufficient documentation

## 2018-01-25 DIAGNOSIS — J449 Chronic obstructive pulmonary disease, unspecified: Secondary | ICD-10-CM | POA: Insufficient documentation

## 2018-01-25 DIAGNOSIS — R0602 Shortness of breath: Secondary | ICD-10-CM | POA: Diagnosis not present

## 2018-01-25 DIAGNOSIS — R1084 Generalized abdominal pain: Secondary | ICD-10-CM | POA: Diagnosis present

## 2018-01-25 LAB — COMPREHENSIVE METABOLIC PANEL
ALT: 25 U/L (ref 0–44)
AST: 25 U/L (ref 15–41)
Albumin: 3.5 g/dL (ref 3.5–5.0)
Alkaline Phosphatase: 75 U/L (ref 38–126)
Anion gap: 9 (ref 5–15)
BUN: 9 mg/dL (ref 6–20)
CO2: 30 mmol/L (ref 22–32)
Calcium: 8.2 mg/dL — ABNORMAL LOW (ref 8.9–10.3)
Chloride: 102 mmol/L (ref 98–111)
Creatinine, Ser: 0.49 mg/dL (ref 0.44–1.00)
GFR calc Af Amer: >60 mL/min (ref >60)
GFR calc non Af Amer: >60 mL/min (ref >60)
Glucose, Bld: 109 mg/dL — ABNORMAL HIGH (ref 70–99)
POTASSIUM: 3.9 mmol/L (ref 3.5–5.1)
SODIUM: 141 mmol/L (ref 135–145)
Total Bilirubin: 1 mg/dL (ref 0.3–1.2)
Total Protein: 6.6 g/dL (ref 6.5–8.1)

## 2018-01-25 LAB — CBC WITH DIFFERENTIAL/PLATELET
Abs Immature Granulocytes: 0.09 10*3/uL — ABNORMAL HIGH (ref 0.00–0.07)
Basophils Absolute: 0.1 10*3/uL (ref 0.0–0.1)
Basophils Relative: 1 %
Eosinophils Absolute: 0 10*3/uL (ref 0.0–0.5)
Eosinophils Relative: 0 %
HCT: 35.1 % — ABNORMAL LOW (ref 36.0–46.0)
Hemoglobin: 10.7 g/dL — ABNORMAL LOW (ref 12.0–15.0)
Immature Granulocytes: 1 %
Lymphocytes Relative: 12 %
Lymphs Abs: 1.6 10*3/uL (ref 0.7–4.0)
MCH: 27.7 pg (ref 26.0–34.0)
MCHC: 30.5 g/dL (ref 30.0–36.0)
MCV: 90.9 fL (ref 80.0–100.0)
Monocytes Absolute: 1.2 10*3/uL — ABNORMAL HIGH (ref 0.1–1.0)
Monocytes Relative: 9 %
NEUTROS PCT: 77 %
Neutro Abs: 10.3 10*3/uL — ABNORMAL HIGH (ref 1.7–7.7)
Platelets: 225 10*3/uL (ref 150–400)
RBC: 3.86 MIL/uL — AB (ref 3.87–5.11)
RDW: 21.9 % — ABNORMAL HIGH (ref 11.5–15.5)
WBC: 13.2 10*3/uL — AB (ref 4.0–10.5)
nRBC: 0 % (ref 0.0–0.2)

## 2018-01-25 LAB — PROTIME-INR
INR: 1.05
Prothrombin Time: 13.7 seconds (ref 11.4–15.2)

## 2018-01-25 LAB — TROPONIN I

## 2018-01-25 LAB — BRAIN NATRIURETIC PEPTIDE: B Natriuretic Peptide: 109.9 pg/mL — ABNORMAL HIGH (ref 0.0–100.0)

## 2018-01-25 MED ORDER — LORAZEPAM 2 MG/ML IJ SOLN: 1.0000 mg | Freq: Once | INTRAMUSCULAR | Status: DC

## 2018-01-25 MED ORDER — LIDOCAINE-EPINEPHRINE (PF) 2 %-1:200000 IJ SOLN
10.0000 mL | Freq: Once | INTRAMUSCULAR | Status: DC
  Filled 2018-01-25: qty 10

## 2018-01-25 NOTE — ED Notes (Signed)
Pt was argumentative upon discharge. Pt wanted to keep calling her mother and daughter that would not answer her phone calls. Pt was discharged and refused discharge V/S. IV was removed. Security was called to escort pt to the lobby to call for a cab home.  Social work was also consulted for pt and pt was offered a bus pass which she refused.

## 2018-01-25 NOTE — ED Notes (Signed)
Bed: ON62 Expected date:  Expected time:  Means of arrival:  Comments: EMS/n/v

## 2018-01-25 NOTE — Telephone Encounter (Signed)
Today 01/25/18 at 6:03  Pm, I, Nila Nephew, contacted Anne Black by the home phone number listed in her chart [phone]. As there was no answer I have left a phone message on her voice mail informing her of the cancellation of her appt. at 9:20 am tomorrow, 01/26/18, as she has been dismissed from the practice.   These actions were taken at the request of CMA [facility], who asserted to be acting under the instruction of Dr. Creta Levin.

## 2018-01-25 NOTE — ED Notes (Addendum)
Pt continuously pressing call bell until someone walks into room.  Writer walks in and pt sts she is leaving.  Writer unhooks pt while asking why the pt wanted to leave.  Pt sts, "I don't have all day to sit here and wait. When is doctor coming"  Pt was informed she would need to be patient, pt ask to use phone.  Phone handed to pt, RN notified of pt plans to leave.

## 2018-01-25 NOTE — ED Notes (Signed)
Pt is upset that the doctor has not been in to see her yet. Pt is demanding water. This RN and NT stated we cannot give her water before the provider sees her. Pt wants a ride home. She wants to see a case Production designer, theatre/television/film. Provider at bedside.

## 2018-01-25 NOTE — ED Triage Notes (Signed)
Pt BIB [facility] from home. Pt c/o abdominal pain, N/V. Pt took here daily dose of zofran which is 20mg  according to pt. EMS gave 4mg  of zofran IV. Pt abdominal pain started 3 years ago. Hx of cirrhosis of the liver and that is when the pain started. Pt admitted to drinking wine this am.

## 2018-01-25 NOTE — ED Notes (Signed)
Labs re drawn and sent

## 2018-01-25 NOTE — Discharge Instructions (Addendum)
You are leaving AGAINST MEDICAL ADVICE.  We discussed that the fluid in your abdomen known is ascites could be infected and this is the reason we wanted to drain some of the fluid.  If there is an infection this could cause a life-threatening infection syndrome and you could end up disabled or dead.  I think this fluid is also causing your trouble breathing and with your chest pain and trouble breathing it was advised to check the fluid and your blood for heart problems.  You are encouraged to return at any time, especially if your symptoms worsen or you change your mind and want to seek care again.  If not, it is important to follow-up closely with your primary care physician.

## 2018-01-25 NOTE — ED Provider Notes (Signed)
[facility]-EMERGENCY DEPT Provider Note   CSN: [id number] Arrival date & time: 01/25/18  1831     History   Chief Complaint Chief Complaint  Patient presents with  . Abdominal Pain  . Emesis    HPI Anne Black is a 57 y.o. female.  HPI 57 year old female with a history of cirrhosis, COPD, and current alcohol abuse presents with abdominal distention and bloating as well as abdominal pain.  Has been ongoing for the last couple days after getting out of the hospital for pneumonia.  She states that shortness of breath is pretty stable since being discharged and each night she will get chest pressure, anxiety, and dyspnea, especially when lying flat.  She is also noticed leg swelling, right greater than left over the last few days.  She has not had any vomiting.  She has been having a lot of diarrhea and some difficulty controlling her bowels.  She states is not really incontinence but when she feels the urge to go she cannot make it in time.  No blood in her stool.  She is hoping to get medical clearance in the emergency department as well because she states that in 3 days she is due to go to [place] for alcohol detox.  She was discharged on oxygen but has not started it yet.  Past Medical History:  Diagnosis Date  . ADHD (attention deficit hyperactivity disorder)   . Alcoholism (HCC)   . Anxiety   . Arthritis   . Ascites   . Bipolar disorder (HCC)   . Cirrhosis (HCC)   . COPD (chronic obstructive pulmonary disease) (HCC)   . Hypokalemia   . Migraine   . Multiple sclerosis (HCC)   . Narcolepsy   . Osteoporosis     Patient Active Problem List   Diagnosis Date Noted  . Community acquired pneumonia of left lower lobe of lung (HCC)   . Weakness 08/21/2017  . COPD exacerbation (HCC) 06/12/2017  . Chest pain with moderate risk for cardiac etiology 05/30/2017  . Abnormal LFTs 05/30/2017  . Tachycardia 05/30/2017  . COPD (chronic obstructive pulmonary  disease) (HCC) 04/19/2017  . Right rib fracture 04/15/2017  . Abdominal wall cellulitis 12/30/2016  . S/P exploratory laparotomy 12/30/2016  . Distal radius fracture, left 09/09/2016  . Thrombocytopenia (HCC) 09/09/2016  . Chest x-ray abnormality   . Umbilical hernia without obstruction and without gangrene 05/12/2016  . Itching 02/24/2016  . Ventral hernia without obstruction or gangrene 02/24/2016  . Palliative care encounter   . Ascites   . Tachypnea   . Atrial flutter with rapid ventricular response (HCC)   . Hypomagnesemia   . Hypoxia   . Dehydration 01/22/2016  . Low back ache 12/30/2015  . Non-intractable vomiting with nausea   . Alcohol abuse with alcohol-induced mood disorder (HCC) 12/15/2015  . Acute alcoholism (HCC)   . Abdominal distension 11/04/2015  . Alcohol abuse   . Dyspnea   . Acute respiratory failure (HCC) 10/15/2015  . Ascites due to alcoholic cirrhosis (HCC) 10/15/2015  . Osteoporosis   . Bipolar I disorder (HCC)   . Multiple sclerosis (HCC)   . Alcoholic cirrhosis of liver with ascites (HCC) 08/22/2015  . Bipolar disorder, current episode mixed, moderate (HCC) 08/01/2015  . Alcohol use disorder, severe, dependence (HCC) 07/31/2015  . Macrocytic anemia- due to alcohol abuse with normal B12 & folate levels 05/31/2015  . Severe protein-calorie malnutrition (HCC) 05/31/2015  . Hypokalemia 05/26/2015  . Encephalopathy, hepatic (HCC)  05/26/2015  . Alcohol withdrawal (HCC) 05/18/2015  . Abdominal pain 05/18/2015  . Anxiety 05/18/2015  . Severe sepsis (HCC) 05/18/2015  . UTI (lower urinary tract infection) 05/18/2015  . Stimulant abuse (HCC) 12/27/2014  . Nicotine abuse 12/27/2014  . Hyperprolactinemia (HCC) 11/15/2014  . Dyslipidemia   . Alcohol use disorder, moderate, dependence (HCC) 10/30/2014  . Tobacco abuse 01/23/2014  . Noncompliance with therapeutic plan 04/04/2013  . Hereditary and idiopathic peripheral neuropathy 05/01/2012  . GERD  (gastroesophageal reflux disease) 05/01/2012  . Osteoarthrosis, unspecified whether generalized or localized, involving lower leg 05/01/2012  . Hyponatremia 07/01/2011    Past Surgical History:  Procedure Laterality Date  . ABDOMINAL WALL DEFECT REPAIR N/A 12/30/2016   Procedure: EXPLORATORY LAPAROTOMY WITH REPAIR ABDOMINAL WALL VENTRAL HERNIA;  Surgeon: [name], MD;  Location[facility] OR;  Service: General;  Laterality: N/A;  . APPLICATION OF WOUND VAC N/A 12/30/2016   Procedure: APPLICATION OF WOUND VAC;  Surgeon: [name], MD;  Location: [facility] OR;  Service: General;  Laterality: N/A;  . CESAREAN SECTION  [phone]  . FRACTURE SURGERY    . HERNIA REPAIR    . MYRINGOTOMY WITH TUBE PLACEMENT Bilateral   . ORIF WRIST FRACTURE Left 09/09/2016   Procedure: OPEN REDUCTION INTERNAL FIXATION (ORIF) LEFT WRIST FRACTURE, LEFT CARPAL TUNNEL RELEASE;  Surgeon: Dominica Severin, MD;  Location: [facility] ORS;  Service: Orthopedics;  Laterality: Left;  . TONSILLECTOMY       OB History   No obstetric history on file.      Home Medications    Prior to Admission medications   Medication Sig Start Date End Date Taking? Authorizing Provider  aluminum-magnesium hydroxide-simethicone (MAALOX) 200-200-20 MG/5ML SUSP Take 30 mLs by mouth 4 (four) times daily -  before meals and at bedtime.   Yes [provider]  benzonatate (TESSALON) 100 MG capsule Take 100 mg by mouth 2 (two) times daily. 12/16/17  Yes [provider]  bismuth subsalicylate (PEPTO-BISMOL) 262 MG chewable tablet Chew 2 tablets (524 mg total) by mouth as needed for diarrhea or loose stools. Patient taking differently: Chew 786 mg by mouth daily as needed for indigestion or diarrhea or loose stools.  07/08/17  Yes Molpus, John, MD  FLUoxetine (PROZAC) 20 MG capsule Take 20 mg by mouth daily. 12/31/17  Yes [provider]  Fluticasone-Salmeterol (ADVAIR) 250-50 MCG/DOSE AEPB Inhale 2 puffs into the lungs 2  (two) times daily. 11/30/17  Yes [provider]  folic acid (FOLVITE) 1 MG tablet Take 1 tablet (1 mg total) by mouth daily. 01/20/18  Yes Leroy Sea, MD  furosemide (LASIX) 40 MG tablet Take 1 tablet (40 mg total) by mouth daily. 06/17/16  Yes Midge Minium, MD  gabapentin (NEURONTIN) 100 MG capsule Take 2 capsules (200 mg total) by mouth 3 (three) times daily. Patient taking differently: Take 300 mg by mouth 3 (three) times daily.  08/31/17  Yes Rolan Bucco, MD  hydrocortisone cream 1 % Apply topically 3 (three) times daily. Patient taking differently: Apply 1 application topically daily as needed for itching.  04/20/17  Yes Sheikh, Omair Latif, DO  HYDROXYZINE HCL PO Take 1 tablet by mouth daily as needed.   Yes [provider]  loperamide (IMODIUM A-D) 2 MG tablet Take 6 mg by mouth daily as needed for diarrhea or loose stools.   Yes [provider]  nicotine (NICODERM CQ - DOSED IN MG/24 HOURS) 14 mg/24hr patch Place 1 patch onto the skin daily. 12/19/17  Yes  [provider]  ondansetron (ZOFRAN-ODT) 4 MG disintegrating tablet Take 4 mg by mouth 3 (three) times daily as needed for nausea or vomiting.  11/27/17  Yes [provider]  pantoprazole (PROTONIX) 40 MG tablet Take 40 mg by mouth daily.   Yes [provider]  potassium chloride SA (K-DUR,KLOR-CON) 20 MEQ tablet Take 1 tablet (20 mEq total) by mouth daily. 01/10/18  Yes Cathren Laine, MD  predniSONE (DELTASONE) 5 MG tablet Label  & dispense according to the schedule below. Take 8 Pills PO for 3 days, 6 Pills PO for 3 days, 4 Pills PO for 3 days, 2 Pills PO for 3 days, 1 Pills PO for 3 days, 1/2 Pill  PO for 3 days then STOP. Total 65 pills. 01/20/18  Yes Leroy Sea, MD  rizatriptan (MAXALT) 5 MG tablet Take 5 mg by mouth every 2 (two) hours as needed for migraine.  12/31/17  Yes [provider]  thiamine 100 MG tablet Take 1 tablet (100 mg total) by mouth daily.  01/20/18  Yes Leroy Sea, MD  topiramate (TOPAMAX) 50 MG tablet Take 50 mg by mouth at bedtime.    Yes [provider]  traZODone (DESYREL) 50 MG tablet Take 50 mg by mouth at bedtime as needed for sleep.  12/31/17  Yes [provider]  VENTOLIN HFA 108 (90 Base) MCG/ACT inhaler Inhale 2 puffs into the lungs every 4 (four) hours as needed for wheezing or shortness of breath.  11/21/17  Yes [provider]  ipratropium-albuterol (DUONEB) 0.5-2.5 (3) MG/3ML SOLN Use twice a day scheduled and every 4 hours as needed for shortness of breath and wheezing Patient not taking: Reported on 01/25/2018 01/20/18   Leroy Sea, MD  lactulose (CHRONULAC) 10 GM/15ML solution Take 30 mLs (20 g total) by mouth 2 (two) times daily. Patient not taking: Reported on 01/25/2018 01/20/18   Leroy Sea, MD    Family History Family History  Problem Relation Age of Onset  . Arrhythmia Mother   . Heart disease Father   . Hypertension Father     Social History Social History   Tobacco Use  . Smoking status: Current Every Day Smoker    Packs/day: 1.00    Years: 35.00    Pack years: 35.00    Types: Cigarettes  . Smokeless tobacco: Never Used  Substance Use Topics  . Alcohol use: Not Currently  . Drug use: No     Allergies   Patient has no known allergies.   Review of Systems Review of Systems  Constitutional: Negative for fever.  Respiratory: Positive for shortness of breath. Negative for cough.   Cardiovascular: Positive for chest pain and leg swelling.  Gastrointestinal: Positive for abdominal distention, abdominal pain and diarrhea. Negative for blood in stool and vomiting.  All other systems reviewed and are negative.    Physical Exam Updated Vital Signs BP 123/63 (BP Location: Left Arm)   Pulse (!) 102   Temp 98.3 F (36.8 C) (Oral)   Resp 20   SpO2 91%   Physical Exam Vitals signs and nursing note reviewed.  Constitutional:       Appearance: She is well-developed.  HENT:     Head: Normocephalic and atraumatic.     Right Ear: External ear normal.     Left Ear: External ear normal.     Nose: Nose normal.  Eyes:     General:        Right eye: No  discharge.        Left eye: No discharge.  Cardiovascular:     Rate and Rhythm: Normal rate and regular rhythm.     Heart sounds: Normal heart sounds.  Pulmonary:     Effort: Pulmonary effort is normal. Tachypnea present. No accessory muscle usage.     Breath sounds: Examination of the right-lower field reveals decreased breath sounds. Examination of the left-lower field reveals decreased breath sounds. Decreased breath sounds present.  Abdominal:     General: There is distension.     Palpations: Abdomen is soft.     Tenderness: There is generalized abdominal tenderness.  Skin:    General: Skin is warm and dry.  Neurological:     Mental Status: She is alert.  Psychiatric:        Mood and Affect: Mood is not anxious.      ED Treatments / Results  Labs (all labs ordered are listed, but only abnormal results are displayed) Labs Reviewed  CBC WITH DIFFERENTIAL/PLATELET - Abnormal; Notable for the following components:      Result Value   WBC 13.2 (*)    RBC 3.86 (*)    Hemoglobin 10.7 (*)    HCT 35.1 (*)    RDW 21.9 (*)    All other components within normal limits  COMPREHENSIVE METABOLIC PANEL - Abnormal; Notable for the following components:   Glucose, Bld 109 (*)    Calcium 8.2 (*)    All other components within normal limits  BODY FLUID CULTURE  PROTIME-INR  TROPONIN I  CBC WITH DIFFERENTIAL/PLATELET  ALBUMIN, PLEURAL OR PERITONEAL FLUID  BODY FLUID CELL COUNT WITH DIFFERENTIAL  GLUCOSE, PLEURAL OR PERITONEAL FLUID  BRAIN NATRIURETIC PEPTIDE    EKG EKG Interpretation  Date/Time:  Wednesday January 25 2018 20:01:04 EST Ventricular Rate:  100 PR Interval:    QRS Duration: 89 QT Interval:  361 QTC Calculation: 466 R Axis:   75 Text  Interpretation:  Sinus tachycardia Poor data quality in current ECG precludes serial comparison otherwise similar to Jan 13 2018 Confirmed by Pricilla Loveless [phone]) on 01/25/2018 8:08:35 PM   Radiology Dg Chest 2 View  Result Date: 01/25/2018 CLINICAL DATA:  Abdominal pain with nausea and vomiting. History of cirrhosis. EXAM: CHEST - 2 VIEW COMPARISON:  Chest x-ray dated January 20, 2018. FINDINGS: The heart size and mediastinal contours are within normal limits. Normal pulmonary vascularity. Unchanged linear scarring/atelectasis at the lung bases. No focal consolidation, pleural effusion, or pneumothorax. No acute osseous abnormality. IMPRESSION: No active cardiopulmonary disease. Electronically Signed   By: Obie Dredge M.D.   On: 01/25/2018 20:11    Procedures Procedures (including critical care time)  Medications Ordered in ED Medications  lidocaine-EPINEPHrine (XYLOCAINE W/EPI) 2 %-1:200000 (PF) injection 10 mL (has no administration in time range)     Initial Impression / Assessment and Plan / ED Course  I have reviewed the triage vital signs and the nursing notes.  Pertinent labs & imaging results that were available during my care of the patient were reviewed by me and considered in my medical decision making (see chart for details).  Clinical Course as of Jan 26 2128  Wed Jan 25, 2018  2128 The patient wants to leave AMA.  I was just prepping her abdomen to do a paracentesis which she agreed to.  However the patient states that she needs to leave and her daughter is her only ride.  I encouraged her that she may still need to be  admitted depending on lab work and work-up of her ascites.  We discussed the concern for SBP as well as potential adverse effects of not identifying and treating this in time including disability and death.  With her nonspecific chest pain or shortness of breath I also want to evaluate for heart failure and MI.  She appears understand what I am advising  and why I am advising it.  She understands she may return and is encouraged to do so at any time.  She appears capable of making her own medical decisions.   [SG]    Clinical Course User Index [SG] Pricilla Loveless, MD    Patient was counseled that she can return at any time and is encouraged to do so. She does not appears septic or critically ill but I suspect fluid overload/ascites and possibly SBP. She is leaving AMA.  Final Clinical Impressions(s) / ED Diagnoses   Final diagnoses:  Ascites due to alcoholic cirrhosis [facility])    ED Discharge Orders    None       Pricilla Loveless, MD 01/25/18 2130

## 2018-01-26 ENCOUNTER — Inpatient Hospital Stay: Payer: Self-pay | Admitting: Family Medicine

## 2018-02-03 NOTE — Telephone Encounter (Signed)
Called patient regarding labs ordered, left message for patient to return call.

## 2018-02-07 NOTE — Telephone Encounter (Signed)
Pt has been dismissed from the practice and needs to find a PCP at another practice. Please call pt and advise.

## 2018-02-07 NOTE — Telephone Encounter (Signed)
Patient called to request being put in a home said that she need someone to take care of her. She was informed that per note in chart she was dismissed from practice. Patient did not understand why so she said that she missed the appointment because her mother refuses to take her places among other things. I then told patient that she needed to be seen so she can discuss her needs with the PCP but she got upset again because her mother wont drive her places. I also suggested that she call her insurance to see what they may do about helping her with rides She then either hung up the phone or put it down. But she was not there. Please advise. Ph# [phone]

## 2018-02-09 NOTE — Telephone Encounter (Signed)
Per message and letter yes patient was dismissed can Practice Admin call patient and explain to her as a courtesy please. Thank you

## 2018-02-13 IMAGING — CR DG CHEST 2V
2 series · 2 of 2 positions shown · non-contrast
Comparison: 02/20/2015 and prior chest radiographs dating back to
11/28/2009

CLINICAL DATA: 54-year-old female with cough for 1 month.

EXAM:
CHEST  2 VIEW

[PA]
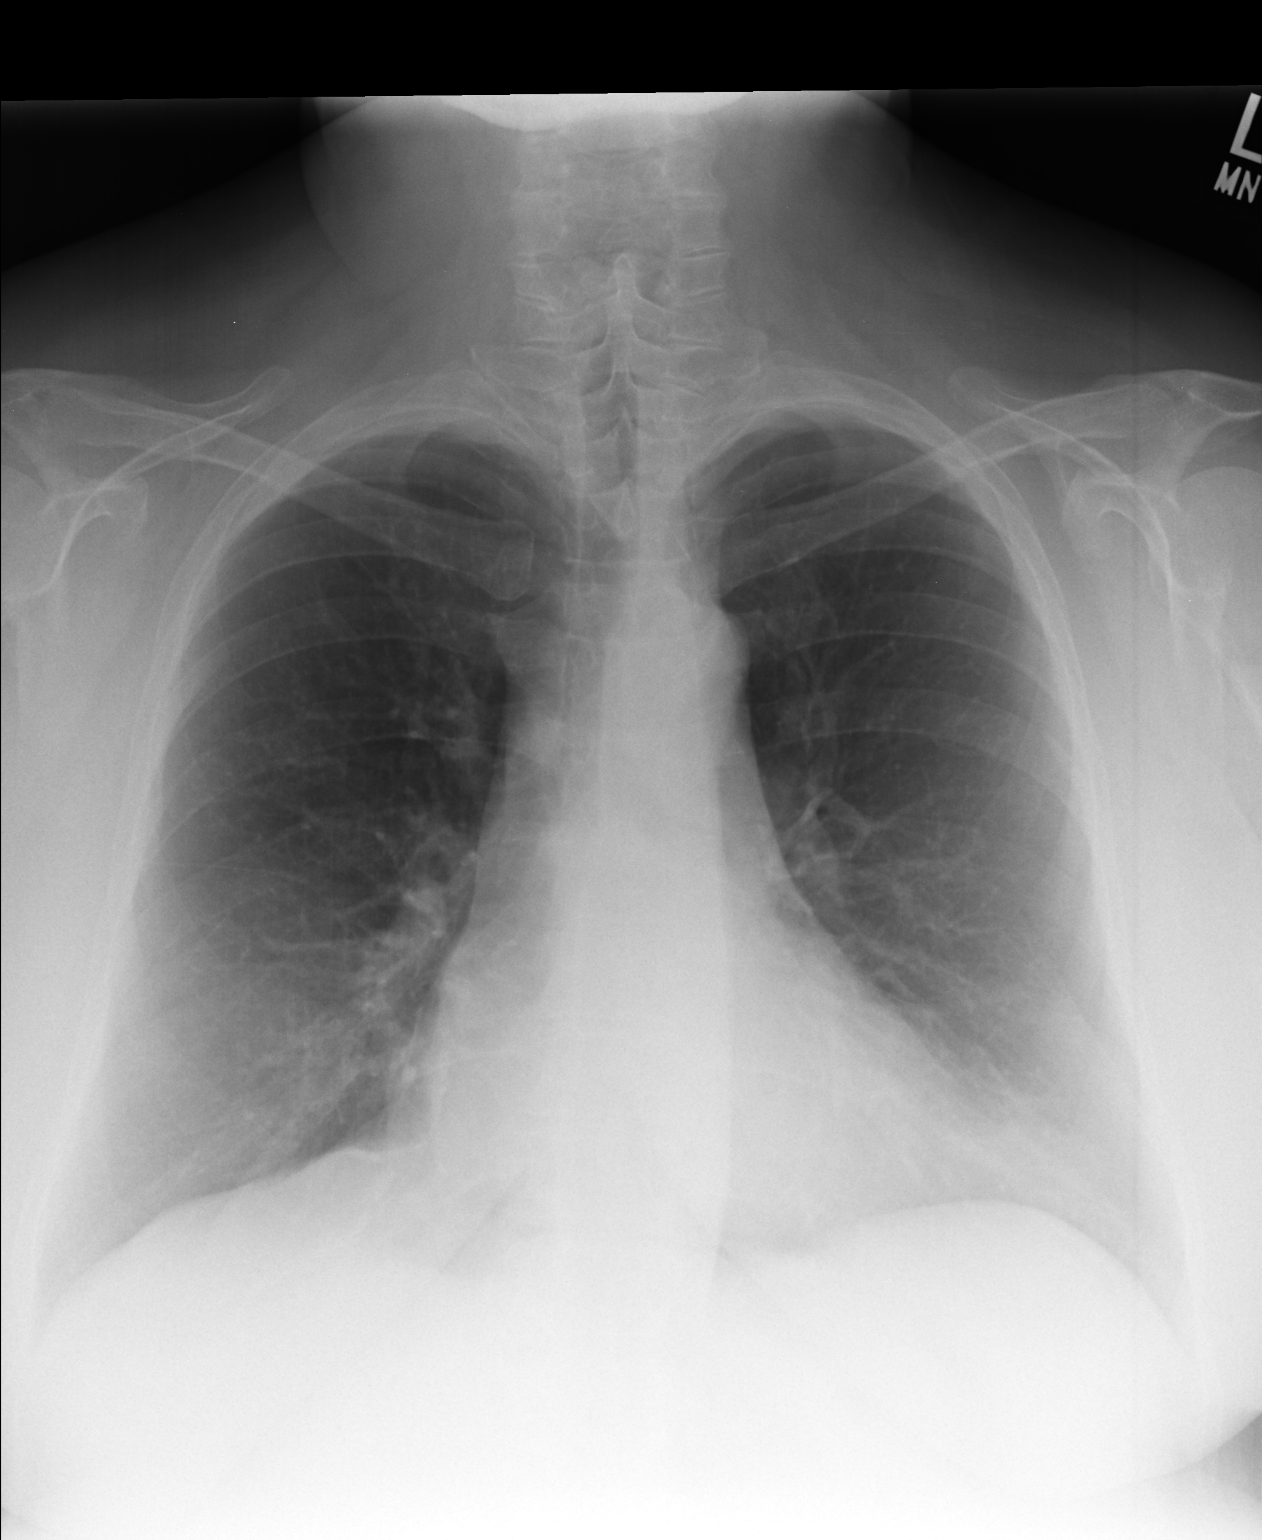

[lateral]
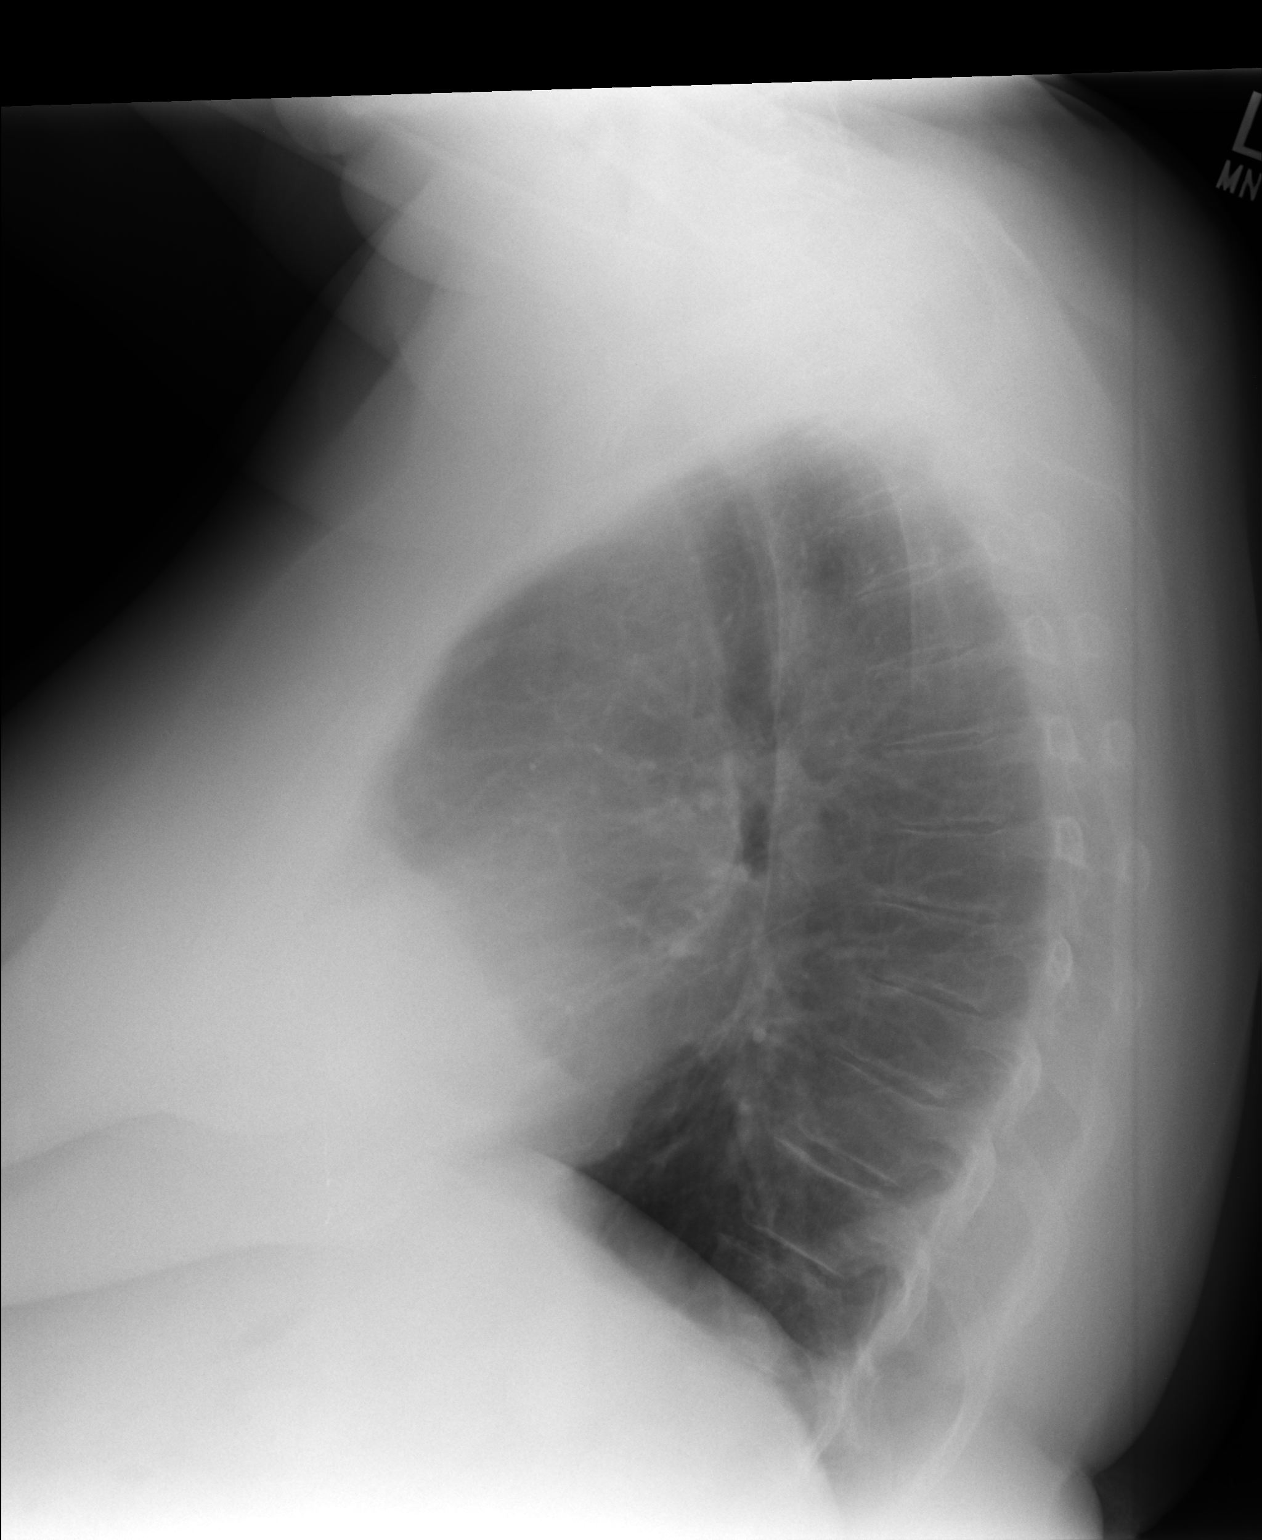

[2 of 2 positions shown; findings below may reference images not displayed]

FINDINGS: Cardiomegaly identified.

There is no evidence of focal airspace disease, pulmonary edema,
suspicious pulmonary nodule/mass, pleural effusion, or pneumothorax.
No acute bony abnormalities are identified.
IMPRESSION: Cardiomegaly without evidence of active cardiopulmonary disease.

## 2018-02-16 ENCOUNTER — Telehealth: Payer: Self-pay | Admitting: Family Medicine

## 2018-02-16 ENCOUNTER — Emergency Department (HOSPITAL_COMMUNITY)
Admission: EM | Admit: 2018-02-16 | Discharge: 2018-02-16 | Disposition: A | Discharge status: Home / Self Care | Payer: 59 | Attending: Emergency Medicine | Admitting: Emergency Medicine

## 2018-02-16 ENCOUNTER — Encounter (HOSPITAL_COMMUNITY): Payer: Self-pay | Admitting: Emergency Medicine

## 2018-02-16 ENCOUNTER — Other Ambulatory Visit: Payer: Self-pay

## 2018-02-16 DIAGNOSIS — R251 Tremor, unspecified: Secondary | ICD-10-CM | POA: Insufficient documentation

## 2018-02-16 DIAGNOSIS — Z5321 Procedure and treatment not carried out due to patient leaving prior to being seen by health care provider: Secondary | ICD-10-CM | POA: Diagnosis not present

## 2018-02-16 NOTE — Telephone Encounter (Signed)
Copied from CRM [id number]. Topic: Quick Communication - See Telephone Encounter >> Feb 16, 2018  4:45 PM Jens Som A wrote: CRM for notification. See Telephone encounter for: 02/16/18.  Patient is requesting to speak to an office manager on why she was dismissed from the practice.

## 2018-02-16 NOTE — ED Notes (Signed)
Bed: [facility]PT4 Expected date:  Expected time:  Means of arrival:  Comments: 

## 2018-02-16 NOTE — ED Notes (Signed)
Patient demanded that writer take her to the Lobby so she can leave. Writer told patient that she needed to stay. Patiaent then told Clinical research associate, "It is your job to take me to the lobby." Patient states she is being picked up by her daughter Roanna Epley.  Patient taken to the lobby via wheelchair. Patient asking visitors to push her in the wheelchair to various places in the lobby.

## 2018-02-16 NOTE — ED Notes (Signed)
Patient reports her daughter, Roanna Epley, is coming to pick her up.

## 2018-02-16 NOTE — ED Notes (Signed)
Patient visualized drinking water out of emesis bag.

## 2018-02-16 NOTE — ED Triage Notes (Signed)
Per EMS, patient from home, c/o tremors and nausea today r/t withdrawal. Hx alcohol abuse. Patient uncooperative and screaming with EMS.

## 2018-02-18 ENCOUNTER — Inpatient Hospital Stay (HOSPITAL_COMMUNITY)
Admission: EM | Admit: 2018-02-18 | Discharge: 2018-02-20 | DRG: 309 | Discharge status: Against Medical Advice | Payer: 59 | Attending: Internal Medicine | Admitting: Internal Medicine

## 2018-02-18 ENCOUNTER — Other Ambulatory Visit: Payer: Self-pay

## 2018-02-18 ENCOUNTER — Encounter (HOSPITAL_COMMUNITY): Payer: Self-pay

## 2018-02-18 DIAGNOSIS — E876 Hypokalemia: Secondary | ICD-10-CM | POA: Diagnosis present

## 2018-02-18 DIAGNOSIS — R251 Tremor, unspecified: Secondary | ICD-10-CM | POA: Diagnosis not present

## 2018-02-18 DIAGNOSIS — F10239 Alcohol dependence with withdrawal, unspecified: Secondary | ICD-10-CM | POA: Diagnosis present

## 2018-02-18 DIAGNOSIS — R197 Diarrhea, unspecified: Secondary | ICD-10-CM | POA: Diagnosis present

## 2018-02-18 DIAGNOSIS — F10231 Alcohol dependence with withdrawal delirium: Secondary | ICD-10-CM

## 2018-02-18 DIAGNOSIS — M1990 Unspecified osteoarthritis, unspecified site: Secondary | ICD-10-CM | POA: Diagnosis present

## 2018-02-18 DIAGNOSIS — Z79899 Other long term (current) drug therapy: Secondary | ICD-10-CM | POA: Diagnosis not present

## 2018-02-18 DIAGNOSIS — G35 Multiple sclerosis: Secondary | ICD-10-CM | POA: Diagnosis present

## 2018-02-18 DIAGNOSIS — F1020 Alcohol dependence, uncomplicated: Secondary | ICD-10-CM | POA: Diagnosis not present

## 2018-02-18 DIAGNOSIS — K219 Gastro-esophageal reflux disease without esophagitis: Secondary | ICD-10-CM | POA: Diagnosis present

## 2018-02-18 DIAGNOSIS — I480 Paroxysmal atrial fibrillation: Principal | ICD-10-CM | POA: Diagnosis present

## 2018-02-18 DIAGNOSIS — F17210 Nicotine dependence, cigarettes, uncomplicated: Secondary | ICD-10-CM | POA: Diagnosis present

## 2018-02-18 DIAGNOSIS — J449 Chronic obstructive pulmonary disease, unspecified: Secondary | ICD-10-CM | POA: Diagnosis present

## 2018-02-18 DIAGNOSIS — F3162 Bipolar disorder, current episode mixed, moderate: Secondary | ICD-10-CM | POA: Diagnosis present

## 2018-02-18 DIAGNOSIS — Z9981 Dependence on supplemental oxygen: Secondary | ICD-10-CM | POA: Diagnosis not present

## 2018-02-18 DIAGNOSIS — Y901 Blood alcohol level of 20-39 mg/100 ml: Secondary | ICD-10-CM | POA: Diagnosis present

## 2018-02-18 DIAGNOSIS — M810 Age-related osteoporosis without current pathological fracture: Secondary | ICD-10-CM | POA: Diagnosis present

## 2018-02-18 DIAGNOSIS — F10939 Alcohol use, unspecified with withdrawal, unspecified: Secondary | ICD-10-CM | POA: Diagnosis present

## 2018-02-18 DIAGNOSIS — Z720 Tobacco use: Secondary | ICD-10-CM

## 2018-02-18 DIAGNOSIS — G47419 Narcolepsy without cataplexy: Secondary | ICD-10-CM | POA: Diagnosis present

## 2018-02-18 DIAGNOSIS — G609 Hereditary and idiopathic neuropathy, unspecified: Secondary | ICD-10-CM | POA: Diagnosis present

## 2018-02-18 DIAGNOSIS — F419 Anxiety disorder, unspecified: Secondary | ICD-10-CM | POA: Diagnosis present

## 2018-02-18 DIAGNOSIS — Z7951 Long term (current) use of inhaled steroids: Secondary | ICD-10-CM

## 2018-02-18 DIAGNOSIS — K7031 Alcoholic cirrhosis of liver with ascites: Secondary | ICD-10-CM | POA: Diagnosis present

## 2018-02-18 DIAGNOSIS — R9431 Abnormal electrocardiogram [ECG] [EKG]: Secondary | ICD-10-CM | POA: Diagnosis not present

## 2018-02-18 DIAGNOSIS — I4891 Unspecified atrial fibrillation: Secondary | ICD-10-CM | POA: Diagnosis not present

## 2018-02-18 LAB — COMPREHENSIVE METABOLIC PANEL
ALT: 15 U/L (ref 0–44)
ANION GAP: 11 (ref 5–15)
AST: 34 U/L (ref 15–41)
Albumin: 3.1 g/dL — ABNORMAL LOW (ref 3.5–5.0)
Alkaline Phosphatase: 88 U/L (ref 38–126)
BUN: <5 mg/dL — ABNORMAL LOW (ref 6–20)
CO2: 23 mmol/L (ref 22–32)
Calcium: 7.7 mg/dL — ABNORMAL LOW (ref 8.9–10.3)
Chloride: 105 mmol/L (ref 98–111)
Creatinine, Ser: 0.56 mg/dL (ref 0.44–1.00)
GFR calc Af Amer: >60 mL/min (ref >60)
GFR calc non Af Amer: >60 mL/min (ref >60)
Glucose, Bld: 86 mg/dL (ref 70–99)
Potassium: 3.6 mmol/L (ref 3.5–5.1)
Sodium: 139 mmol/L (ref 135–145)
Total Bilirubin: 1.6 mg/dL — ABNORMAL HIGH (ref 0.3–1.2)
Total Protein: 6.5 g/dL (ref 6.5–8.1)

## 2018-02-18 LAB — CBC WITH DIFFERENTIAL/PLATELET
Abs Immature Granulocytes: 0.01 10*3/uL (ref 0.00–0.07)
BASOS PCT: 1 %
Basophils Absolute: 0 10*3/uL (ref 0.0–0.1)
EOS ABS: 0 10*3/uL (ref 0.0–0.5)
Eosinophils Relative: 0 %
HCT: 32.5 % — ABNORMAL LOW (ref 36.0–46.0)
Hemoglobin: 10.2 g/dL — ABNORMAL LOW (ref 12.0–15.0)
Immature Granulocytes: 0 %
Lymphocytes Relative: 11 %
Lymphs Abs: 0.5 10*3/uL — ABNORMAL LOW (ref 0.7–4.0)
MCH: 27.4 pg (ref 26.0–34.0)
MCHC: 31.4 g/dL (ref 30.0–36.0)
MCV: 87.4 fL (ref 80.0–100.0)
Monocytes Absolute: 0.4 10*3/uL (ref 0.1–1.0)
Monocytes Relative: 8 %
Neutro Abs: 3.6 10*3/uL (ref 1.7–7.7)
Neutrophils Relative %: 80 %
PLATELETS: 154 10*3/uL (ref 150–400)
RBC: 3.72 MIL/uL — ABNORMAL LOW (ref 3.87–5.11)
RDW: 17.6 % — ABNORMAL HIGH (ref 11.5–15.5)
WBC: 4.4 10*3/uL (ref 4.0–10.5)
nRBC: 0 % (ref 0.0–0.2)

## 2018-02-18 LAB — I-STAT TROPONIN, ED
Troponin i, poc: 0 ng/mL (ref 0.00–0.08)
Troponin i, poc: 0 ng/mL (ref 0.00–0.08)

## 2018-02-18 LAB — PROTIME-INR
INR: 1.4
Prothrombin Time: 17 seconds — ABNORMAL HIGH (ref 11.4–15.2)

## 2018-02-18 LAB — ETHANOL: Alcohol, Ethyl (B): 28 mg/dL — ABNORMAL HIGH (ref <10)

## 2018-02-18 LAB — TSH: TSH: 2.18 u[IU]/mL (ref 0.350–4.500)

## 2018-02-18 LAB — I-STAT BETA HCG BLOOD, ED (MC, WL, AP ONLY): I-stat hCG, quantitative: <5 m[IU]/mL (ref <5)

## 2018-02-18 LAB — LIPASE, BLOOD: Lipase: 24 U/L (ref 11–51)

## 2018-02-18 LAB — MAGNESIUM: Magnesium: 1.4 mg/dL — ABNORMAL LOW (ref 1.7–2.4)

## 2018-02-18 LAB — T4, FREE: Free T4: 1.03 ng/dL (ref 0.82–1.77)

## 2018-02-18 MED ORDER — LACTATED RINGERS IV BOLUS
1000.0000 mL | Freq: Once | INTRAVENOUS | Status: AC
  Administered 2018-02-18: 1000 mL via INTRAVENOUS

## 2018-02-18 MED ORDER — MAGNESIUM SULFATE 2 GM/50ML IV SOLN
2.0000 g | Freq: Once | INTRAVENOUS | Status: AC
  Administered 2018-02-18: 2 g via INTRAVENOUS
  Filled 2018-02-18: qty 50

## 2018-02-18 MED ORDER — ONDANSETRON HCL 4 MG/2ML IJ SOLN: 4.0000 mg | Freq: Four times a day (QID) | INTRAMUSCULAR | Status: DC | PRN

## 2018-02-18 MED ORDER — TOPIRAMATE 25 MG PO TABS
50.0000 mg | ORAL_TABLET | Freq: Every day | ORAL | Status: DC
  Administered 2018-02-19: 50 mg via ORAL
  Filled 2018-02-18 (×2): qty 2

## 2018-02-18 MED ORDER — NICOTINE 7 MG/24HR TD PT24: 7.0000 mg | MEDICATED_PATCH | Freq: Every day | TRANSDERMAL | Status: DC

## 2018-02-18 MED ORDER — LORAZEPAM 2 MG/ML IJ SOLN: 0.0000 mg | Freq: Two times a day (BID) | INTRAMUSCULAR | Status: DC

## 2018-02-18 MED ORDER — NICOTINE 21 MG/24HR TD PT24
21.0000 mg | MEDICATED_PATCH | Freq: Every day | TRANSDERMAL | Status: DC
  Administered 2018-02-18: 21 mg via TRANSDERMAL
  Filled 2018-02-18: qty 1

## 2018-02-18 MED ORDER — MOMETASONE FURO-FORMOTEROL FUM 200-5 MCG/ACT IN AERO
2.0000 | INHALATION_SPRAY | Freq: Two times a day (BID) | RESPIRATORY_TRACT | Status: DC
  Filled 2018-02-18: qty 8.8

## 2018-02-18 MED ORDER — ONDANSETRON HCL 4 MG PO TABS: 4.0000 mg | ORAL_TABLET | Freq: Four times a day (QID) | ORAL | Status: DC | PRN

## 2018-02-18 MED ORDER — LORAZEPAM 2 MG/ML IJ SOLN
0.0000 mg | Freq: Four times a day (QID) | INTRAMUSCULAR | Status: DC
  Administered 2018-02-18 (×2): 2 mg via INTRAVENOUS
  Filled 2018-02-18 (×2): qty 1

## 2018-02-18 MED ORDER — GABAPENTIN 300 MG PO CAPS
300.0000 mg | ORAL_CAPSULE | Freq: Three times a day (TID) | ORAL | Status: DC
  Administered 2018-02-18 – 2018-02-20 (×5): 300 mg via ORAL
  Filled 2018-02-18 (×5): qty 1

## 2018-02-18 MED ORDER — LORAZEPAM 1 MG PO TABS
1.0000 mg | ORAL_TABLET | Freq: Four times a day (QID) | ORAL | Status: DC | PRN
  Administered 2018-02-19: 1 mg via ORAL

## 2018-02-18 MED ORDER — ACETAMINOPHEN 325 MG PO TABS
650.0000 mg | ORAL_TABLET | Freq: Four times a day (QID) | ORAL | Status: DC | PRN
  Administered 2018-02-19: 650 mg via ORAL
  Filled 2018-02-18: qty 2

## 2018-02-18 MED ORDER — LORAZEPAM 2 MG/ML IJ SOLN: 0.0000 mg | Freq: Four times a day (QID) | INTRAMUSCULAR | Status: DC

## 2018-02-18 MED ORDER — ADULT MULTIVITAMIN W/MINERALS CH
1.0000 | ORAL_TABLET | Freq: Every day | ORAL | Status: DC
  Administered 2018-02-19 – 2018-02-20 (×2): 1 via ORAL
  Filled 2018-02-18 (×2): qty 1

## 2018-02-18 MED ORDER — SODIUM CHLORIDE 0.9 % IV SOLN: 250.0000 mL | INTRAVENOUS | Status: DC | PRN

## 2018-02-18 MED ORDER — ALBUTEROL SULFATE (2.5 MG/3ML) 0.083% IN NEBU
3.0000 mL | INHALATION_SOLUTION | RESPIRATORY_TRACT | Status: DC | PRN
  Administered 2018-02-20: 3 mL via RESPIRATORY_TRACT
  Filled 2018-02-18: qty 3

## 2018-02-18 MED ORDER — METOCLOPRAMIDE HCL 5 MG/ML IJ SOLN: 10.0000 mg | Freq: Once | INTRAMUSCULAR | Status: DC | PRN

## 2018-02-18 MED ORDER — PANTOPRAZOLE SODIUM 40 MG PO TBEC
40.0000 mg | DELAYED_RELEASE_TABLET | Freq: Every day | ORAL | Status: DC
  Administered 2018-02-19 – 2018-02-20 (×2): 40 mg via ORAL
  Filled 2018-02-18 (×2): qty 1

## 2018-02-18 MED ORDER — BENZONATATE 100 MG PO CAPS
100.0000 mg | ORAL_CAPSULE | Freq: Two times a day (BID) | ORAL | Status: DC
  Administered 2018-02-18 – 2018-02-20 (×4): 100 mg via ORAL
  Filled 2018-02-18 (×4): qty 1

## 2018-02-18 MED ORDER — DILTIAZEM HCL-DEXTROSE 100-5 MG/100ML-% IV SOLN (PREMIX)
5.0000 mg/h | INTRAVENOUS | Status: DC
  Administered 2018-02-18: 5 mg/h via INTRAVENOUS
  Filled 2018-02-18: qty 100

## 2018-02-18 MED ORDER — SODIUM CHLORIDE 0.9% FLUSH: 3.0000 mL | INTRAVENOUS | Status: DC | PRN

## 2018-02-18 MED ORDER — VITAMIN B-1 100 MG PO TABS
100.0000 mg | ORAL_TABLET | Freq: Every day | ORAL | Status: DC
  Administered 2018-02-18 – 2018-02-20 (×3): 100 mg via ORAL
  Filled 2018-02-18 (×3): qty 1

## 2018-02-18 MED ORDER — LORAZEPAM 2 MG/ML IJ SOLN: 1.0000 mg | Freq: Four times a day (QID) | INTRAMUSCULAR | Status: DC | PRN

## 2018-02-18 MED ORDER — LORAZEPAM 1 MG PO TABS
0.0000 mg | ORAL_TABLET | Freq: Four times a day (QID) | ORAL | Status: DC
  Administered 2018-02-19: 2 mg via ORAL
  Administered 2018-02-19: 1 mg via ORAL
  Filled 2018-02-18: qty 1
  Filled 2018-02-18: qty 2
  Filled 2018-02-18: qty 1

## 2018-02-18 MED ORDER — FLUOXETINE HCL 20 MG PO CAPS
20.0000 mg | ORAL_CAPSULE | Freq: Every day | ORAL | Status: DC
  Administered 2018-02-19 – 2018-02-20 (×2): 20 mg via ORAL
  Filled 2018-02-18 (×2): qty 1

## 2018-02-18 MED ORDER — IPRATROPIUM-ALBUTEROL 0.5-2.5 (3) MG/3ML IN SOLN: 3.0000 mL | RESPIRATORY_TRACT | Status: DC | PRN

## 2018-02-18 MED ORDER — THIAMINE HCL 100 MG/ML IJ SOLN: 100.0000 mg | Freq: Every day | INTRAMUSCULAR | Status: DC

## 2018-02-18 MED ORDER — THIAMINE HCL 100 MG/ML IJ SOLN
100.0000 mg | Freq: Every day | INTRAMUSCULAR | Status: DC
  Filled 2018-02-18: qty 2

## 2018-02-18 MED ORDER — FOLIC ACID 1 MG PO TABS
1.0000 mg | ORAL_TABLET | Freq: Every day | ORAL | Status: DC
  Administered 2018-02-19 – 2018-02-20 (×2): 1 mg via ORAL
  Filled 2018-02-18 (×2): qty 1

## 2018-02-18 MED ORDER — APIXABAN 5 MG PO TABS
5.0000 mg | ORAL_TABLET | Freq: Two times a day (BID) | ORAL | Status: DC
  Administered 2018-02-18 – 2018-02-19 (×2): 5 mg via ORAL
  Filled 2018-02-18 (×3): qty 1

## 2018-02-18 MED ORDER — VITAMIN B-1 100 MG PO TABS: 100.0000 mg | ORAL_TABLET | Freq: Every day | ORAL | Status: DC

## 2018-02-18 MED ORDER — DILTIAZEM HCL 25 MG/5ML IV SOLN
10.0000 mg | Freq: Once | INTRAVENOUS | Status: AC
  Administered 2018-02-18: 10 mg via INTRAVENOUS
  Filled 2018-02-18: qty 5

## 2018-02-18 MED ORDER — SODIUM CHLORIDE 0.9% FLUSH
3.0000 mL | Freq: Two times a day (BID) | INTRAVENOUS | Status: DC
  Administered 2018-02-19 (×2): 3 mL via INTRAVENOUS

## 2018-02-18 MED ORDER — ONDANSETRON HCL 4 MG PO TABS
4.0000 mg | ORAL_TABLET | Freq: Three times a day (TID) | ORAL | Status: DC | PRN
  Filled 2018-02-18: qty 1

## 2018-02-18 MED ORDER — ACETAMINOPHEN 650 MG RE SUPP: 650.0000 mg | Freq: Four times a day (QID) | RECTAL | Status: DC | PRN

## 2018-02-18 MED ORDER — LORAZEPAM 1 MG PO TABS: 0.0000 mg | ORAL_TABLET | Freq: Two times a day (BID) | ORAL | Status: DC

## 2018-02-18 NOTE — Progress Notes (Signed)
ANTICOAGULATION CONSULT NOTE  Pharmacy Consult for apixaban Indication: atrial fibrillation  Labs: Recent Labs    02/18/18 1512  HGB 10.2*  HCT 32.5*  PLT 154  LABPROT 17.0*  INR 1.40  CREATININE 0.56    Assessment: 57 yof with hx ETOH abuse, cirrhosis with ascites, Bipolar disorder, MS presenting with afib. Pharmacy consulted to dose apixaban. Not on anticoagulation PTA. SCr wnl, Hg 10.2, plt wnl, LFTs wnl. No bleed documented.  Per MD note - "she will not be a candidate for anticoagulation but can give it to her while she is in the hospital."  Goal of Therapy:  Stroke prevention Monitor platelets by anticoagulation protocol: Yes   Plan:  Apixaban 5mg  PO BID Monitor CBC, s/sx bleeding Appears plan is only to continue while inpatient?  Babs Bertin, PharmD, BCPS Clinical Pharmacist 02/18/2018 6:40 PM

## 2018-02-18 NOTE — ED Notes (Signed)
Pt in sinus tach rhythm at this time. Admitting doctor present at bedside.

## 2018-02-18 NOTE — ED Notes (Signed)
Pt's BM was watery, completely unformed, and bright yellow-green in color.

## 2018-02-18 NOTE — ED Notes (Addendum)
Pt in a-fib with rvr at this time. MD aware.

## 2018-02-18 NOTE — ED Notes (Signed)
Purewick applied at 15:10 upon arrival. Pt requesting to use bedpan at this time.

## 2018-02-18 NOTE — H&P (Addendum)
History and Physical    Anne Black  WUJ:[id number]  DOB: 1960-07-07  DOA: 02/18/2018 PCP: Doristine BosworthStallings, Zoe A, MD   Patient coming from: home  Chief Complaint: trying to quit drinking  HPI: Anne ApplebaumCarla S Black is a 58 y.o. female with medical history of ETOH abuse, cirrhosis with ascites, COPD, ongoing smoking (uses O2 "PRN") at home, Bipolar disorder, MS who is trying to quit drinking and comes in to the ED for help with this. She states her last drink was last night. Yesterday she drank about 5 "big" cans of beer yesterday. The day before yesterday she drank 2 bottles of wine.  In the ED she complains of nausea, diarrhea since yesterday, numbness and tingling around her mouth and tremors that are worse than her usual. She has been off balance at home.  In the ED, she is also noted to be going in and out of A-fib. She states a doctor at [facility] has told her in the past that she has A-fib. She occasionally will feel palpations at rest. Not having them now.   Last admitted 01/13/18-01/20/18 for aspiration pneumonia.  ED Course: ETOH level 28, EKG showing A-fib, Cardizem infusion started after a 10 mg bolus, Ativan given, 1 L LR bolus given  Review of Systems:  All other systems reviewed and apart from HPI, are negative.  Past Medical History:  Diagnosis Date  . ADHD (attention deficit hyperactivity disorder)   . Alcoholism (HCC)   . Anxiety   . Arthritis   . Ascites   . Bipolar disorder (HCC)   . Cirrhosis (HCC)   . COPD (chronic obstructive pulmonary disease) (HCC)   . Hypokalemia   . Migraine   . Multiple sclerosis (HCC)   . Narcolepsy   . Osteoporosis     Past Surgical History:  Procedure Laterality Date  . ABDOMINAL WALL DEFECT REPAIR N/A 12/30/2016   Procedure: EXPLORATORY LAPAROTOMY WITH REPAIR ABDOMINAL WALL VENTRAL HERNIA;  Surgeon: Emelia LoronWakefield, Matthew, MD;  [facility];  Service: General;  Laterality: N/A;  . APPLICATION OF WOUND VAC N/A 12/30/2016   Procedure:  APPLICATION OF WOUND VAC;  Surgeon: Emelia LoronWakefield, Matthew, MD;  [facility];  Service: General;  Laterality: N/A;  . CESAREAN SECTION  [phone]  . FRACTURE SURGERY    . HERNIA REPAIR    . MYRINGOTOMY WITH TUBE PLACEMENT Bilateral   . ORIF WRIST FRACTURE Left 09/09/2016   Procedure: OPEN REDUCTION INTERNAL FIXATION (ORIF) LEFT WRIST FRACTURE, LEFT CARPAL TUNNEL RELEASE;  Surgeon: Dominica SeverinGramig, William, MD;  Location: [facility] ORS;  Service: Orthopedics;  Laterality: Left;  . TONSILLECTOMY      Social History:   reports that she has been smoking cigarettes 1/2ppd  She has never used smokeless tobacco. She reports current alcohol use. She reports that she does not use drugs. Drinks beer and wine, varying amounts daily.   No Known Allergies  Family History  Problem Relation Age of Onset  . Arrhythmia Mother   . Heart disease Father   . Hypertension Father      Prior to Admission medications   Medication Sig Start Date End Date Taking? Authorizing Provider  aluminum-magnesium hydroxide-simethicone (MAALOX) 200-200-20 MG/5ML SUSP Take 30 mLs by mouth 4 (four) times daily -  before meals and at bedtime.    [provider]  benzonatate (TESSALON) 100 MG capsule Take 100 mg by mouth 2 (two) times daily. 12/16/17   [provider]  bismuth subsalicylate (PEPTO-BISMOL) 262 MG chewable tablet Chew 2  tablets (524 mg total) by mouth as needed for diarrhea or loose stools. Patient taking differently: Chew 786 mg by mouth daily as needed for indigestion or diarrhea or loose stools.  07/08/17   Molpus, John, MD  FLUoxetine (PROZAC) 20 MG capsule Take 20 mg by mouth daily. 12/31/17   [provider]  Fluticasone-Salmeterol (ADVAIR) 250-50 MCG/DOSE AEPB Inhale 2 puffs into the lungs 2 (two) times daily. 11/30/17   [provider]  folic acid (FOLVITE) 1 MG tablet Take 1 tablet (1 mg total) by mouth daily. 01/20/18   Leroy Sea, MD  furosemide (LASIX) 40 MG tablet Take 1  tablet (40 mg total) by mouth daily. 06/17/16   Midge Minium, MD  gabapentin (NEURONTIN) 100 MG capsule Take 2 capsules (200 mg total) by mouth 3 (three) times daily. Patient taking differently: Take 300 mg by mouth 3 (three) times daily.  08/31/17   Rolan Bucco, MD  hydrocortisone cream 1 % Apply topically 3 (three) times daily. Patient taking differently: Apply 1 application topically daily as needed for itching.  04/20/17   Sheikh, Omair Latif, DO  HYDROXYZINE HCL PO Take 1 tablet by mouth daily as needed.    [provider]  ipratropium-albuterol (DUONEB) 0.5-2.5 (3) MG/3ML SOLN Use twice a day scheduled and every 4 hours as needed for shortness of breath and wheezing Patient not taking: Reported on 01/25/2018 01/20/18   Leroy Sea, MD  lactulose (CHRONULAC) 10 GM/15ML solution Take 30 mLs (20 g total) by mouth 2 (two) times daily. Patient not taking: Reported on 01/25/2018 01/20/18   Leroy Sea, MD  loperamide (IMODIUM A-D) 2 MG tablet Take 6 mg by mouth daily as needed for diarrhea or loose stools.    [provider]  nicotine (NICODERM CQ - DOSED IN MG/24 HOURS) 14 mg/24hr patch Place 1 patch onto the skin daily. 12/19/17   [provider]  ondansetron (ZOFRAN-ODT) 4 MG disintegrating tablet Take 4 mg by mouth 3 (three) times daily as needed for nausea or vomiting.  11/27/17   [provider]  pantoprazole (PROTONIX) 40 MG tablet Take 40 mg by mouth daily.    [provider]  potassium chloride SA (K-DUR,KLOR-CON) 20 MEQ tablet Take 1 tablet (20 mEq total) by mouth daily. 01/10/18   Cathren Laine, MD  predniSONE (DELTASONE) 5 MG tablet Label  & dispense according to the schedule below. Take 8 Pills PO for 3 days, 6 Pills PO for 3 days, 4 Pills PO for 3 days, 2 Pills PO for 3 days, 1 Pills PO for 3 days, 1/2 Pill  PO for 3 days then STOP. Total 65 pills. 01/20/18   Leroy Sea, MD  rizatriptan (MAXALT) 5 MG tablet Take 5 mg by  mouth every 2 (two) hours as needed for migraine.  12/31/17   [provider]  thiamine 100 MG tablet Take 1 tablet (100 mg total) by mouth daily. 01/20/18   Leroy Sea, MD  topiramate (TOPAMAX) 50 MG tablet Take 50 mg by mouth at bedtime.     [provider]  traZODone (DESYREL) 50 MG tablet Take 50 mg by mouth at bedtime as needed for sleep.  12/31/17   [provider]  VENTOLIN HFA 108 (90 Base) MCG/ACT inhaler Inhale 2 puffs into the lungs every 4 (four) hours as needed for wheezing or shortness of breath.  11/21/17   [provider]    Physical Exam: Wt Readings from Last 3 Encounters:  02/18/18 83.9 kg  01/18/18 71.9 kg  01/10/18 81.6 kg   Vitals:   02/18/18 1619 02/18/18 1700 02/18/18 1740 02/18/18 1745  BP: (!) 152/137 131/65 132/76 132/76  Pulse:    (!) 104  Resp: (!) 29 (!) 22 (!) 34 (!) 29  Temp:      TempSrc:      SpO2:  98%  97%  Weight:      Height:          Constitutional:  Calm & comfortable- tremors of head and arms, shaky voice Eyes: PERRLA, lids and conjunctivae normal ENT:  Mucous membranes are moist.  Pharynx clear of exudate   Normal dentition.  Neck: Supple, no masses  Respiratory:  Clear to auscultation bilaterally  Normal respiratory effort.  Cardiovascular:  S1 & S2 heard, regular rate and rhythm No Murmurs Abdomen:  Non distended No tenderness, No masses Bowel sounds normal Extremities:  No clubbing / cyanosis No pedal edema No joint deformity    Skin:  No rashes, lesions or ulcers Neurologic:  AAO x 3 CN 2-12 grossly intact Sensation intact Strength 5/5 in all 4 extremities Psychiatric:  Normal Mood and affect    Labs on Admission: I have personally reviewed following labs and imaging studies  CBC: Recent Labs  Lab 02/18/18 1512  WBC 4.4  NEUTROABS 3.6  HGB 10.2*  HCT 32.5*  MCV 87.4  PLT 154   Basic Metabolic Panel: Recent Labs  Lab 02/18/18 1512  NA 139  K 3.6  CL  105  CO2 23  GLUCOSE 86  BUN <5*  CREATININE 0.56  CALCIUM 7.7*  MG 1.4*   GFR: Estimated Creatinine Clearance: 77 mL/min (by C-G formula based on SCr of 0.56 mg/dL). Liver Function Tests: Recent Labs  Lab 02/18/18 1512  AST 34  ALT 15  ALKPHOS 88  BILITOT 1.6*  PROT 6.5  ALBUMIN 3.1*   Recent Labs  Lab 02/18/18 1512  LIPASE 24   No results for input(s): AMMONIA in the last 168 hours. Coagulation Profile: Recent Labs  Lab 02/18/18 1512  INR 1.40   Cardiac Enzymes: No results for input(s): CKTOTAL, CKMB, CKMBINDEX, TROPONINI in the last 168 hours. BNP (last 3 results) No results for input(s): PROBNP in the last 8760 hours. HbA1C: No results for input(s): HGBA1C in the last 72 hours. CBG: No results for input(s): GLUCAP in the last 168 hours. Lipid Profile: No results for input(s): CHOL, HDL, LDLCALC, TRIG, CHOLHDL, LDLDIRECT in the last 72 hours. Thyroid Function Tests: No results for input(s): TSH, T4TOTAL, FREET4, T3FREE, THYROIDAB in the last 72 hours. Anemia Panel: No results for input(s): VITAMINB12, FOLATE, FERRITIN, TIBC, IRON, RETICCTPCT in the last 72 hours. Urine analysis:    Component Value Date/Time   COLORURINE YELLOW 08/28/2017 0627   APPEARANCEUR CLEAR 08/28/2017 0627   LABSPEC 1.008 08/28/2017 0627   PHURINE 5.0 08/28/2017 0627   GLUCOSEU NEGATIVE 08/28/2017 0627   HGBUR MODERATE (A) 08/28/2017 0627   BILIRUBINUR NEGATIVE 08/28/2017 0627   BILIRUBINUR moderate (A) 06/19/2015 1906   KETONESUR NEGATIVE 08/28/2017 0627   PROTEINUR NEGATIVE 08/28/2017 0627   UROBILINOGEN >=8.0 06/19/2015 1906   UROBILINOGEN 0.2 09/14/2011 2141   NITRITE NEGATIVE 08/28/2017 0627   LEUKOCYTESUR NEGATIVE 08/28/2017 0627   Sepsis Labs: @LABRCNTIP (procalcitonin:4,lacticidven:4) )No results found for this or any previous visit (from the past 240 hour(s)).   Radiological Exams on Admission: No results found.  EKG: Independently reviewed. A-fib with  RVR  Assessment/Plan Principal Problem:   A-fib (  HCC) - paroxysmal   - she will not be a candidate for anticoagulation but can give it to her while she is in the hospital - on Cardizem infusion- will continue this for now as she is reverting between A-fib and NSR - last ECHO in 7/19 - will repeat and obtain TFTs  Active Problems:    Alcohol withdrawal    Alcohol use disorder, severe, dependence - CIWA scale with Ativan, Folic acid, Thiamine - she went to a residential rehab for about a week after she was discharged from the hospital in Dec but she was told that not stay there due to her severe incontinence - SW consult    Bipolar disorder, current episode mixed, moderate  - Prozac, Topamax,Trazodone    Alcoholic cirrhosis of liver with ascites  - states she does not take Lactulose- is quite lucid without it - hold diuretics for now as she may not drink as much as she does at home - follow for resumption    Multiple sclerosis ? - not on medications for this   Hereditary and idiopathic peripheral neuropathy -cont Neurontin     Tobacco abuse - smokes 1/2 ppd- will order a nicotine patch  COPD - cont home meds- she uses O2 'PRN' at home- keep pulse ox > 90%  DVT prophylaxis: Eliquis Code Status: Full code  Family Communication:   Disposition Plan:   Consults called: none  Admission status: inpatient- alcohol withdrawal will likely need to be treated over a number of days and thus I do not expect her to leave in 48 hrs.     Calvert Cantor MD [facility] Pager: [email or web address] Password [email or web address] 7PM-7AM, please contact night-coverage   02/18/2018, 6:11 PM

## 2018-02-18 NOTE — ED Triage Notes (Signed)
Per EMS pt is from her own home. Pt complaining of tremors and nausea today from alcohol withdrawal. Pt reports that her last drink was last night. Pt reports she normally drinks one bottle of wine per day. EMS reports giving pt 4mg  of zofran for nausea and 5mg  of Haldol IV. Pt has not taken any of her MS medications at home for an unknown amount of time.

## 2018-02-18 NOTE — ED Provider Notes (Signed)
[facility] EMERGENCY DEPARTMENT Provider Note   CSN: [id number] Arrival date & time: 02/18/18  1451     History   Chief Complaint Chief Complaint  Patient presents with  . Alcohol Intoxication  . Atrial Fibrillation    HPI Anne Black is a 58 y.o. female.  The history is provided by the patient. No language interpreter was used.     Patient is a 58 year old female with a past medical history of ADHD, anxiety, bipolar disorder, cirrhosis, COPD currently on 2 L at baseline at home, MS, narcolepsy, osteoporosis, and EtOH abuse who presents for evaluation of tremors and nausea that began after her last drink sometime yesterday evening.  Patient states she typically drinks "at least 1 bottle of wine per day".  She is not able to recall when she had her last drink last night.  She states she woke up today feeling numbness in her lips, severe tremors in her arms, and nausea.  She states she has been through withdrawal in the past with hallucinations but denies any hallucinations today.  She denies any history of withdrawal seizures or other seizure history.  She states she was in a detox facility for 30 days during December but relapsed after returning home.  She denies any other illicit or recreational drug use.  She states she takes Protonix for GERD but is unable to specify any other daily medications.  She denies any recent fevers, chills, vision changes, chest pain, cough, shortness of breath, vomiting, dysuria, blood in her stool, blood in her urine, rash, focal extremity numbness weakness or tingling, or other acute complaints.  Denies SI or HI.  Past Medical History:  Diagnosis Date  . ADHD (attention deficit hyperactivity disorder)   . Alcoholism (HCC)   . Anxiety   . Arthritis   . Ascites   . Bipolar disorder (HCC)   . Cirrhosis (HCC)   . COPD (chronic obstructive pulmonary disease) (HCC)   . Hypokalemia   . Migraine   . Multiple sclerosis (HCC)   .  Narcolepsy   . Osteoporosis     Patient Active Problem List   Diagnosis Date Noted  . A-fib (HCC) 02/18/2018  . Community acquired pneumonia of left lower lobe of lung (HCC)   . Weakness 08/21/2017  . COPD exacerbation (HCC) 06/12/2017  . Chest pain with moderate risk for cardiac etiology 05/30/2017  . Abnormal LFTs 05/30/2017  . Tachycardia 05/30/2017  . COPD (chronic obstructive pulmonary disease) (HCC) 04/19/2017  . Right rib fracture 04/15/2017  . Abdominal wall cellulitis 12/30/2016  . S/P exploratory laparotomy 12/30/2016  . Distal radius fracture, left 09/09/2016  . Thrombocytopenia (HCC) 09/09/2016  . Chest x-ray abnormality   . Umbilical hernia without obstruction and without gangrene 05/12/2016  . Itching 02/24/2016  . Ventral hernia without obstruction or gangrene 02/24/2016  . Palliative care encounter   . Ascites   . Tachypnea   . Atrial flutter with rapid ventricular response (HCC)   . Hypomagnesemia   . Hypoxia   . Dehydration 01/22/2016  . Low back ache 12/30/2015  . Non-intractable vomiting with nausea   . Alcohol abuse with alcohol-induced mood disorder (HCC) 12/15/2015  . Acute alcoholism (HCC)   . Abdominal distension 11/04/2015  . Alcohol abuse   . Dyspnea   . Acute respiratory failure (HCC) 10/15/2015  . Ascites due to alcoholic cirrhosis (HCC) 10/15/2015  . Osteoporosis   . Bipolar I disorder (HCC)   . Multiple sclerosis (HCC)   .  Alcoholic cirrhosis of liver with ascites (HCC) 08/22/2015  . Bipolar disorder, current episode mixed, moderate (HCC) 08/01/2015  . Alcohol use disorder, severe, dependence (HCC) 07/31/2015  . Macrocytic anemia- due to alcohol abuse with normal B12 & folate levels 05/31/2015  . Severe protein-calorie malnutrition (HCC) 05/31/2015  . Hypokalemia 05/26/2015  . Encephalopathy, hepatic (HCC) 05/26/2015  . Alcohol withdrawal (HCC) 05/18/2015  . Abdominal pain 05/18/2015  . Anxiety 05/18/2015  . Severe sepsis (HCC)  05/18/2015  . UTI (lower urinary tract infection) 05/18/2015  . Stimulant abuse (HCC) 12/27/2014  . Nicotine abuse 12/27/2014  . Hyperprolactinemia (HCC) 11/15/2014  . Dyslipidemia   . Alcohol use disorder, moderate, dependence (HCC) 10/30/2014  . Tobacco abuse 01/23/2014  . Noncompliance with therapeutic plan 04/04/2013  . Hereditary and idiopathic peripheral neuropathy 05/01/2012  . GERD (gastroesophageal reflux disease) 05/01/2012  . Osteoarthrosis, unspecified whether generalized or localized, involving lower leg 05/01/2012  . Hyponatremia 07/01/2011    Past Surgical History:  Procedure Laterality Date  . ABDOMINAL WALL DEFECT REPAIR N/A 12/30/2016   Procedure: EXPLORATORY LAPAROTOMY WITH REPAIR ABDOMINAL WALL VENTRAL HERNIA;  Surgeon: [name], MD;  Location: [facility] OR;  Service: General;  Laterality: N/A;  . APPLICATION OF WOUND VAC N/A 12/30/2016   Procedure: APPLICATION OF WOUND VAC;  Surgeon: [name], MD;  Location: [facility] OR;  Service: General;  Laterality: N/A;  . CESAREAN SECTION  [phone]  . FRACTURE SURGERY    . HERNIA REPAIR    . MYRINGOTOMY WITH TUBE PLACEMENT Bilateral   . ORIF WRIST FRACTURE Left 09/09/2016   Procedure: OPEN REDUCTION INTERNAL FIXATION (ORIF) LEFT WRIST FRACTURE, LEFT CARPAL TUNNEL RELEASE;  Surgeon: Dominica Severin, MD;  Location: [facility] ORS;  Service: Orthopedics;  Laterality: Left;  . TONSILLECTOMY       OB History   No obstetric history on file.      Home Medications    Prior to Admission medications   Medication Sig Start Date End Date Taking? Authorizing Provider  aluminum-magnesium hydroxide-simethicone (MAALOX) 200-200-20 MG/5ML SUSP Take 30 mLs by mouth 4 (four) times daily -  before meals and at bedtime.    [provider]  benzonatate (TESSALON) 100 MG capsule Take 100 mg by mouth 2 (two) times daily. 12/16/17   [provider]  bismuth subsalicylate (PEPTO-BISMOL) 262 MG chewable tablet Chew 2 tablets  (524 mg total) by mouth as needed for diarrhea or loose stools. Patient taking differently: Chew 786 mg by mouth daily as needed for indigestion or diarrhea or loose stools.  07/08/17   Molpus, John, MD  FLUoxetine (PROZAC) 20 MG capsule Take 20 mg by mouth daily. 12/31/17   [provider]  Fluticasone-Salmeterol (ADVAIR) 250-50 MCG/DOSE AEPB Inhale 2 puffs into the lungs 2 (two) times daily. 11/30/17   [provider]  folic acid (FOLVITE) 1 MG tablet Take 1 tablet (1 mg total) by mouth daily. 01/20/18   Leroy Sea, MD  furosemide (LASIX) 40 MG tablet Take 1 tablet (40 mg total) by mouth daily. 06/17/16   Midge Minium, MD  gabapentin (NEURONTIN) 100 MG capsule Take 2 capsules (200 mg total) by mouth 3 (three) times daily. Patient taking differently: Take 300 mg by mouth 3 (three) times daily.  08/31/17   Rolan Bucco, MD  hydrocortisone cream 1 % Apply topically 3 (three) times daily. Patient taking differently: Apply 1 application topically daily as needed for itching.  04/20/17   Sheikh, Omair Latif, DO  HYDROXYZINE HCL PO Take 1 tablet by  mouth daily as needed.    [provider]  ipratropium-albuterol (DUONEB) 0.5-2.5 (3) MG/3ML SOLN Use twice a day scheduled and every 4 hours as needed for shortness of breath and wheezing Patient not taking: Reported on 01/25/2018 01/20/18   Leroy SeaSingh, Prashant K, MD  lactulose (CHRONULAC) 10 GM/15ML solution Take 30 mLs (20 g total) by mouth 2 (two) times daily. Patient not taking: Reported on 01/25/2018 01/20/18   Leroy SeaSingh, Prashant K, MD  loperamide (IMODIUM A-D) 2 MG tablet Take 6 mg by mouth daily as needed for diarrhea or loose stools.    [provider]  nicotine (NICODERM CQ - DOSED IN MG/24 HOURS) 14 mg/24hr patch Place 1 patch onto the skin daily. 12/19/17   [provider]  ondansetron (ZOFRAN-ODT) 4 MG disintegrating tablet Take 4 mg by mouth 3 (three) times daily as needed for nausea or vomiting.   11/27/17   [provider]  pantoprazole (PROTONIX) 40 MG tablet Take 40 mg by mouth daily.    [provider]  potassium chloride SA (K-DUR,KLOR-CON) 20 MEQ tablet Take 1 tablet (20 mEq total) by mouth daily. 01/10/18   Cathren LaineSteinl, Kevin, MD  predniSONE (DELTASONE) 5 MG tablet Label  & dispense according to the schedule below. Take 8 Pills PO for 3 days, 6 Pills PO for 3 days, 4 Pills PO for 3 days, 2 Pills PO for 3 days, 1 Pills PO for 3 days, 1/2 Pill  PO for 3 days then STOP. Total 65 pills. 01/20/18   Leroy SeaSingh, Prashant K, MD  rizatriptan (MAXALT) 5 MG tablet Take 5 mg by mouth every 2 (two) hours as needed for migraine.  12/31/17   [provider]  thiamine 100 MG tablet Take 1 tablet (100 mg total) by mouth daily. 01/20/18   Leroy SeaSingh, Prashant K, MD  topiramate (TOPAMAX) 50 MG tablet Take 50 mg by mouth at bedtime.     [provider]  traZODone (DESYREL) 50 MG tablet Take 50 mg by mouth at bedtime as needed for sleep.  12/31/17   [provider]  VENTOLIN HFA 108 (90 Base) MCG/ACT inhaler Inhale 2 puffs into the lungs every 4 (four) hours as needed for wheezing or shortness of breath.  11/21/17   [provider]    Family History Family History  Problem Relation Age of Onset  . Arrhythmia Mother   . Heart disease Father   . Hypertension Father     Social History Social History   Tobacco Use  . Smoking status: Current Every Day Smoker    Packs/day: 1.00    Years: 35.00    Pack years: 35.00    Types: Cigarettes  . Smokeless tobacco: Never Used  Substance Use Topics  . Alcohol use: Yes    Comment: 1 bottle of wine per day per patient.   . Drug use: No     Allergies   Patient has no known allergies.   Review of Systems Review of Systems  Constitutional: Negative for chills and fever.  HENT: Negative for ear pain and sore throat.   Eyes: Negative for pain and visual disturbance.  Respiratory: Negative for cough and shortness  of breath.   Cardiovascular: Negative for chest pain and palpitations.  Gastrointestinal: Positive for diarrhea ( chronic). Negative for abdominal pain and vomiting.  Genitourinary: Negative for dysuria and hematuria.  Musculoskeletal: Negative for arthralgias and back pain.  Skin: Negative for color change and rash.  Neurological: Positive for numbness ( around lips). Negative  for seizures and syncope.  All other systems reviewed and are negative.    Physical Exam Updated Vital Signs BP 125/67 (BP Location: Right Arm)   Pulse 100   Temp 99.2 F (37.3 C) (Oral)   Resp (!) 22   Ht 5\' 2"  (1.575 m)   Wt 83.9 kg   SpO2 98%   BMI 33.84 kg/m   Physical Exam Vitals signs and nursing note reviewed.  Constitutional:      General: She is not in acute distress.    Appearance: She is well-developed.  HENT:     Head: Normocephalic and atraumatic.     Right Ear: External ear normal.     Left Ear: External ear normal.     Nose: Nose normal.  Eyes:     Conjunctiva/sclera: Conjunctivae normal.  Neck:     Musculoskeletal: Neck supple.  Cardiovascular:     Rate and Rhythm: Normal rate and regular rhythm.     Heart sounds: No murmur.  Pulmonary:     Effort: Pulmonary effort is normal. No respiratory distress.     Breath sounds: Normal breath sounds.  Abdominal:     Palpations: Abdomen is soft.     Tenderness: There is no abdominal tenderness.  Skin:    General: Skin is warm and dry.  Neurological:     General: No focal deficit present.     Mental Status: She is alert.     Motor: Tremor present.     ED Treatments / Results  Labs (all labs ordered are listed, but only abnormal results are displayed) Labs Reviewed  CBC WITH DIFFERENTIAL/PLATELET - Abnormal; Notable for the following components:      Result Value   RBC 3.72 (*)    Hemoglobin 10.2 (*)    HCT 32.5 (*)    RDW 17.6 (*)    Lymphs Abs 0.5 (*)    All other components within normal limits  COMPREHENSIVE METABOLIC  PANEL - Abnormal; Notable for the following components:   BUN <5 (*)    Calcium 7.7 (*)    Albumin 3.1 (*)    Total Bilirubin 1.6 (*)    All other components within normal limits  MAGNESIUM - Abnormal; Notable for the following components:   Magnesium 1.4 (*)    All other components within normal limits  ETHANOL - Abnormal; Notable for the following components:   Alcohol, Ethyl (B) 28 (*)    All other components within normal limits  PROTIME-INR - Abnormal; Notable for the following components:   Prothrombin Time 17.0 (*)    All other components within normal limits  LIPASE, BLOOD  TSH  T4, FREE  BASIC METABOLIC PANEL  CBC  I-STAT BETA HCG BLOOD, ED ([facility], [facility], [facility] ONLY)  I-STAT TROPONIN, ED  I-STAT TROPONIN, ED    EKG None  Radiology No results found.  Procedures Procedures (including critical care time)  Medications Ordered in ED Medications  nicotine (NICODERM CQ - dosed in mg/24 hours) patch 21 mg (21 mg Transdermal Patch Applied 02/18/18 1540)  LORazepam (ATIVAN) injection 0-4 mg (2 mg Intravenous Given 02/18/18 1535)    Or  LORazepam (ATIVAN) tablet 0-4 mg ( Oral See Alternative 02/18/18 1535)  LORazepam (ATIVAN) injection 0-4 mg (has no administration in time range)    Or  LORazepam (ATIVAN) tablet 0-4 mg (has no administration in time range)  thiamine (VITAMIN B-1) tablet 100 mg (100 mg Oral Given 02/18/18 1540)    Or  thiamine (B-1) injection 100  mg ( Intravenous See Alternative 02/18/18 1540)  metoCLOPramide (REGLAN) injection 10 mg (has no administration in time range)  diltiazem (CARDIZEM) 100 mg in dextrose 5% 100mL (1 mg/mL) infusion (5 mg/hr Intravenous New Bag/Given 02/18/18 1644)  sodium chloride flush (NS) 0.9 % injection 3 mL (has no administration in time range)  sodium chloride flush (NS) 0.9 % injection 3 mL (has no administration in time range)  0.9 %  sodium chloride infusion (has no administration in time range)  ondansetron (ZOFRAN) tablet 4 mg (has  no administration in time range)    Or  ondansetron (ZOFRAN) injection 4 mg (has no administration in time range)  acetaminophen (TYLENOL) tablet 650 mg (has no administration in time range)    Or  acetaminophen (TYLENOL) suppository 650 mg (has no administration in time range)  LORazepam (ATIVAN) tablet 1 mg (has no administration in time range)    Or  LORazepam (ATIVAN) injection 1 mg (has no administration in time range)  folic acid (FOLVITE) tablet 1 mg (has no administration in time range)  multivitamin with minerals tablet 1 tablet (has no administration in time range)  gabapentin (NEURONTIN) capsule 300 mg (has no administration in time range)  pantoprazole (PROTONIX) EC tablet 40 mg (has no administration in time range)  topiramate (TOPAMAX) tablet 50 mg (has no administration in time range)  albuterol (PROVENTIL) (2.5 MG/3ML) 0.083% nebulizer solution 3 mL (has no administration in time range)  benzonatate (TESSALON) capsule 100 mg (has no administration in time range)  FLUoxetine (PROZAC) capsule 20 mg (has no administration in time range)  mometasone-formoterol (DULERA) 200-5 MCG/ACT inhaler 2 puff (has no administration in time range)  ipratropium-albuterol (DUONEB) 0.5-2.5 (3) MG/3ML nebulizer solution 3 mL (has no administration in time range)  apixaban (ELIQUIS) tablet 5 mg (has no administration in time range)  lactated ringers bolus 1,000 mL (0 mLs Intravenous Stopped 02/18/18 1647)  diltiazem (CARDIZEM) injection 10 mg (10 mg Intravenous Given 02/18/18 1642)  magnesium sulfate IVPB 2 g 50 mL (2 g Intravenous New Bag/Given 02/18/18 1744)     Initial Impression / Assessment and Plan / ED Course  I have reviewed the triage vital signs and the nursing notes.  Pertinent labs & imaging results that were available during my care of the patient were reviewed by me and considered in my medical decision making (see chart for details).     Patient is a 58 year old female who  presents with above-stated history exam.  On presentation patient is afebrile with a normal blood pressure, not tachycardic, with otherwise stable vital signs on home 2 L.  Exam as above remarkable for bilateral upper extremity tremors without any focal neurological deficits.  Lungs are clear to auscultation bilaterally abdomen soft and nontender.  ECG shows a ventricular rate of 86 with a QTC of 520, normal axis, and no other signs of acute ischemic change.  CMP shows no significant electrolyte or metabolic abnormalities.  CBC shows a WBC count of 4.4, hemoglobin 10.2 which is stable compared to prior levels, platelets 154.  Magnesium 1.4.  This was repleted in emergency department.  Lipase 24.  Patient was noted to be in A. fib with RVR with a rate in the 140s.  She was given a diltiazem bolus and started on a diltiazem drip and was noted to convert back to a sinus rhythm with a ventricular rate in the 90s.  Patient was placed on [place] protocol for alcohol withdrawal.  History exam is not consistent with sepsis,  meningitis, ACS, PE, aortic dissection, or other imminently life-threatening etiology.  Patient admitted in stable condition to hospital service for further evaluation and management.    Final Clinical Impressions(s) / ED Diagnoses   Final diagnoses:  Hypomagnesemia  Atrial fibrillation with RVR [facility])  Tremor    ED Discharge Orders    None       Antoine Primas, MD 02/18/18 Prentice Docker    Gerhard Munch, MD 02/19/18 [phone]

## 2018-02-19 DIAGNOSIS — E876 Hypokalemia: Secondary | ICD-10-CM

## 2018-02-19 DIAGNOSIS — F10239 Alcohol dependence with withdrawal, unspecified: Secondary | ICD-10-CM

## 2018-02-19 LAB — CBC
HCT: 30 % — ABNORMAL LOW (ref 36.0–46.0)
Hemoglobin: 9.2 g/dL — ABNORMAL LOW (ref 12.0–15.0)
MCH: 26.4 pg (ref 26.0–34.0)
MCHC: 30.7 g/dL (ref 30.0–36.0)
MCV: 86 fL (ref 80.0–100.0)
PLATELETS: 117 10*3/uL — AB (ref 150–400)
RBC: 3.49 MIL/uL — ABNORMAL LOW (ref 3.87–5.11)
RDW: 17.2 % — ABNORMAL HIGH (ref 11.5–15.5)
WBC: 4.2 10*3/uL (ref 4.0–10.5)
nRBC: 0 % (ref 0.0–0.2)

## 2018-02-19 LAB — BASIC METABOLIC PANEL
Anion gap: 7 (ref 5–15)
BUN: 6 mg/dL (ref 6–20)
CO2: 28 mmol/L (ref 22–32)
Calcium: 8 mg/dL — ABNORMAL LOW (ref 8.9–10.3)
Chloride: 101 mmol/L (ref 98–111)
Creatinine, Ser: 0.54 mg/dL (ref 0.44–1.00)
GFR calc Af Amer: >60 mL/min (ref >60)
GFR calc non Af Amer: >60 mL/min (ref >60)
Glucose, Bld: 102 mg/dL — ABNORMAL HIGH (ref 70–99)
Potassium: 2.8 mmol/L — ABNORMAL LOW (ref 3.5–5.1)
Sodium: 136 mmol/L (ref 135–145)

## 2018-02-19 LAB — MAGNESIUM: Magnesium: 1.8 mg/dL (ref 1.7–2.4)

## 2018-02-19 MED ORDER — NICOTINE 21 MG/24HR TD PT24
21.0000 mg | MEDICATED_PATCH | Freq: Every day | TRANSDERMAL | Status: DC
  Administered 2018-02-19 – 2018-02-20 (×2): 21 mg via TRANSDERMAL
  Filled 2018-02-19 (×2): qty 1

## 2018-02-19 MED ORDER — NICOTINE 7 MG/24HR TD PT24
7.0000 mg | MEDICATED_PATCH | Freq: Every day | TRANSDERMAL | Status: DC
  Administered 2018-02-19: 7 mg via TRANSDERMAL
  Filled 2018-02-19: qty 1

## 2018-02-19 MED ORDER — ENOXAPARIN SODIUM 40 MG/0.4ML ~~LOC~~ SOLN
40.0000 mg | SUBCUTANEOUS | Status: DC
  Administered 2018-02-19: 40 mg via SUBCUTANEOUS
  Filled 2018-02-19: qty 0.4

## 2018-02-19 MED ORDER — LORAZEPAM 2 MG/ML IJ SOLN
2.0000 mg | Freq: Once | INTRAMUSCULAR | Status: AC
  Administered 2018-02-19: 2 mg via INTRAVENOUS

## 2018-02-19 MED ORDER — NICOTINE 21 MG/24HR TD PT24: 21.0000 mg | MEDICATED_PATCH | Freq: Every day | TRANSDERMAL | Status: DC

## 2018-02-19 MED ORDER — LORAZEPAM 2 MG/ML IJ SOLN
INTRAMUSCULAR | Status: AC
  Filled 2018-02-19: qty 1

## 2018-02-19 MED ORDER — METOPROLOL TARTRATE 12.5 MG HALF TABLET: 12.5000 mg | ORAL_TABLET | Freq: Two times a day (BID) | ORAL | Status: DC

## 2018-02-19 MED ORDER — POTASSIUM CHLORIDE CRYS ER 20 MEQ PO TBCR
40.0000 meq | EXTENDED_RELEASE_TABLET | ORAL | Status: AC
  Administered 2018-02-19 (×2): 40 meq via ORAL
  Filled 2018-02-19 (×2): qty 2

## 2018-02-19 MED ORDER — DILTIAZEM HCL ER COATED BEADS 120 MG PO CP24
120.0000 mg | ORAL_CAPSULE | Freq: Every day | ORAL | Status: DC
  Administered 2018-02-19 – 2018-02-20 (×2): 120 mg via ORAL
  Filled 2018-02-19 (×2): qty 1

## 2018-02-19 MED ORDER — LORAZEPAM 1 MG PO TABS
0.0000 mg | ORAL_TABLET | ORAL | Status: DC
  Administered 2018-02-20: 2 mg via ORAL
  Filled 2018-02-19: qty 2

## 2018-02-19 MED ORDER — LORAZEPAM 2 MG/ML IJ SOLN
0.0000 mg | INTRAMUSCULAR | Status: DC
  Administered 2018-02-19: 2 mg via INTRAVENOUS
  Administered 2018-02-20: 1 mg via INTRAVENOUS
  Filled 2018-02-19 (×3): qty 1

## 2018-02-19 MED ORDER — DIPHENOXYLATE-ATROPINE 2.5-0.025 MG PO TABS
1.0000 | ORAL_TABLET | Freq: Four times a day (QID) | ORAL | Status: DC | PRN
  Administered 2018-02-19: 1 via ORAL
  Filled 2018-02-19: qty 1

## 2018-02-19 NOTE — Progress Notes (Signed)
Patient states she might leave AMA, trying to contact cab.  Ambulated patient down hall, unsteady gait.  Advised she will need to arrange pick up if leaving AMA.  Patient became annoyed and states she will stay overnight.

## 2018-02-19 NOTE — Progress Notes (Signed)
Pt c/o needing higher nicoderm patch, states she uses 21mg  patch, advised she actually smokes 2ppd.  Also requesting immodium.  Willl notify Dr. Butler Denmark.

## 2018-02-19 NOTE — Progress Notes (Signed)
Text paged [facility] with potassium level of 2.8 this AM, also let them know that her Cardizem drip was stopped since she converted to NSR 70's with B/P 90-120's/60-70's. EKG was done prior to stopping her Cardizem to verify NSR, awaiting further orders.

## 2018-02-19 NOTE — Progress Notes (Signed)
Orders received to give 40 MEQs of potassium orally x 1 now and to check her magnesium level since it was low yesterday and was replaced, potassium given without any difficulty, awaiting results of magnesium level, no other changes, will continue to monitor.

## 2018-02-19 NOTE — Progress Notes (Signed)
Patient becoming increasing agitated.  Again saying wants to leave AMA, wants to drink.  Patient states she is anxious and visible tremors noted. Dr. Butler Denmark notified, order for ativan 2mg  IV x one and change frequency to Q4h.

## 2018-02-19 NOTE — Progress Notes (Signed)
ANTICOAGULATION CONSULT NOTE  Pharmacy Consult for Lovenox Indication: VTE prophylaxis  Labs: Recent Labs    02/18/18 1512 02/19/18 0406  HGB 10.2* 9.2*  HCT 32.5* 30.0*  PLT 154 117*  LABPROT 17.0*  --   INR 1.40  --   CREATININE 0.56 0.54    Assessment: 33 yof admitted with afib and started on apixaban, now NSR. Pharmacy consulted to dose Lovenox for VTE prophylaxis - does not need full dose anticoagulation for CHADSVASC score per discussion with MD. Last dose of apixaban was today at 0904. Hg down to 9.2, plt down to 117. SCr stable WNL. No bleed issues documented.  Goal of Therapy:  VTE prevention Monitor platelets by anticoagulation protocol: Yes   Plan:  D/c apixaban - start Lovenox 40mg  [place] q24h at time next apixaban dose would have been due tonight Monitor CBC, s/sx bleeding Rx will s/o consult and monitor peripherally  Babs Bertin, PharmD, BCPS Clinical Pharmacist 02/19/2018 2:07 PM

## 2018-02-19 NOTE — Progress Notes (Addendum)
PROGRESS NOTE    Anne Black   DDU:[id number]  DOB: 03-05-1960  DOA: 02/18/2018 PCP: No primary care provider on file.   Brief Narrative:  Anne Black is a 58 y.o. female with medical history of ETOH abuse, cirrhosis with ascites, COPD, ongoing smoking (uses O2 "PRN") at home, Bipolar disorder, MS who is trying to quit drinking and comes in to the ED for help with this. Last drink the night before. Noted to have episodes of PAF in the ED and started on a Cardizem infusion. Also complained of some diarrhea without abdominal pain or vomiting. Had mild nausea.     Subjective: States she is still having diarrhea and mild nausea.     Assessment & Plan:   Principal Problem:   A-fib  - noted to be paroxsymal in the ED, now NSR- Cardizem infusion on hold- - it appears she had a cardiology eval in 4/19 for A-fib (in the ED) -  Start metoprolol 12.5 BID with holding parameters - thyroid studies ordered and found to be normal  - CHA2DS2-VASc Score 1 but awaiting ECHO  Active Problems:   Alcohol withdrawal    Alcohol use disorder, severe, dependence - continue to use CIWA scale with Ativan - cont  Folic acid, Thiamine - she went to a residential rehab for about a week after she was discharged from the hospital in Dec but she was told that not stay there due to her severe urinary incontinence - SW consulted  Diarrhea - probably non- infectious- no abdominal tenderness- follow  Hypokalemia - replacing- likely due to diarrhea and also lasix use- Mg normal     Bipolar disorder, current episode mixed, moderate  - Prozac, Topamax,Trazodone    Alcoholic cirrhosis of liver with ascites  - states she does not take Lactulose- has been quite lucid without it - hold diuretics for now as she may not drink as much as she does at home - follow for resumption    Multiple sclerosis ? - not on medications for this   Hereditary and idiopathic peripheral neuropathy -cont Neurontin       Tobacco abuse - smokes 1/2 ppd- cont nicotine patch- counseled   COPD - cont home meds- she uses O2 'PRN' at home- keep pulse ox > 90% - will see if we can wean to room air   Time spent in minutes: 35 DVT prophylaxis: Lovenox Code Status: Full code Family Communication:  Disposition Plan: home when withdrawal resolves- f/u on A-fib, await ECHO Consultants:   none Procedures:   none Antimicrobials:  Anti-infectives (From admission, onward)   None       Objective: Vitals:   02/19/18 0730 02/19/18 0800 02/19/18 0926 02/19/18 1150  BP: (!) 106/48 126/61  (!) 112/53  Pulse:  78  89  Resp: (!) 25     Temp:   98.6 F (37 C)   TempSrc:   Oral   SpO2:   97%   Weight:      Height:        Intake/Output Summary (Last 24 hours) at 02/19/2018 1340 Last data filed at 02/19/2018 1200 Gross per 24 hour  Intake 1532.18 ml  Output 601 ml  Net 931.18 ml   Filed Weights   02/18/18 1510  Weight: 83.9 kg    Examination: General exam: Appears comfortable  HEENT: PERRLA, oral mucosa moist, no sclera icterus or thrush Respiratory system: Clear to auscultation. Respiratory effort normal. Cardiovascular system: S1 & S2 heard, RRR.  Gastrointestinal system: Abdomen soft, non-tender, nondistended. Normal bowel sounds. Central nervous system: Alert and oriented. No focal neurological deficits. Extremities: No cyanosis, clubbing or edema Skin: No rashes or ulcers Psychiatry:  Mood & affect appropriate.     Data Reviewed: I have personally reviewed following labs and imaging studies  CBC: Recent Labs  Lab 02/18/18 1512 02/19/18 0406  WBC 4.4 4.2  NEUTROABS 3.6  --   HGB 10.2* 9.2*  HCT 32.5* 30.0*  MCV 87.4 86.0  PLT 154 117*   Basic Metabolic Panel: Recent Labs  Lab 02/18/18 1512 02/19/18 0406  NA 139 136  K 3.6 2.8*  CL 105 101  CO2 23 28  GLUCOSE 86 102*  BUN <5* 6  CREATININE 0.56 0.54  CALCIUM 7.7* 8.0*  MG 1.4* 1.8   GFR: Estimated Creatinine  Clearance: 77 mL/min (by C-G formula based on SCr of 0.54 mg/dL). Liver Function Tests: Recent Labs  Lab 02/18/18 1512  AST 34  ALT 15  ALKPHOS 88  BILITOT 1.6*  PROT 6.5  ALBUMIN 3.1*   Recent Labs  Lab 02/18/18 1512  LIPASE 24   No results for input(s): AMMONIA in the last 168 hours. Coagulation Profile: Recent Labs  Lab 02/18/18 1512  INR 1.40   Cardiac Enzymes: No results for input(s): CKTOTAL, CKMB, CKMBINDEX, TROPONINI in the last 168 hours. BNP (last 3 results) No results for input(s): PROBNP in the last 8760 hours. HbA1C: No results for input(s): HGBA1C in the last 72 hours. CBG: No results for input(s): GLUCAP in the last 168 hours. Lipid Profile: No results for input(s): CHOL, HDL, LDLCALC, TRIG, CHOLHDL, LDLDIRECT in the last 72 hours. Thyroid Function Tests: Recent Labs    02/18/18 1944  TSH 2.180  FREET4 1.03   Anemia Panel: No results for input(s): VITAMINB12, FOLATE, FERRITIN, TIBC, IRON, RETICCTPCT in the last 72 hours. Urine analysis:    Component Value Date/Time   COLORURINE YELLOW 08/28/2017 0627   APPEARANCEUR CLEAR 08/28/2017 0627   LABSPEC 1.008 08/28/2017 0627   PHURINE 5.0 08/28/2017 0627   GLUCOSEU NEGATIVE 08/28/2017 0627   HGBUR MODERATE (A) 08/28/2017 0627   BILIRUBINUR NEGATIVE 08/28/2017 0627   BILIRUBINUR moderate (A) 06/19/2015 1906   KETONESUR NEGATIVE 08/28/2017 0627   PROTEINUR NEGATIVE 08/28/2017 0627   UROBILINOGEN >=8.0 06/19/2015 1906   UROBILINOGEN 0.2 09/14/2011 2141   NITRITE NEGATIVE 08/28/2017 0627   LEUKOCYTESUR NEGATIVE 08/28/2017 0627   Sepsis Labs: @LABRCNTIP (procalcitonin:4,lacticidven:4) )No results found for this or any previous visit (from the past 240 hour(s)).       Radiology Studies: No results found.    Scheduled Meds: . apixaban  5 mg Oral BID  . benzonatate  100 mg Oral BID  . FLUoxetine  20 mg Oral Daily  . folic acid  1 mg Oral Daily  . gabapentin  300 mg Oral TID  . LORazepam   0-4 mg Intravenous Q6H   Or  . LORazepam  0-4 mg Oral Q6H  . [START ON 02/20/2018] LORazepam  0-4 mg Intravenous Q12H   Or  . [START ON 02/20/2018] LORazepam  0-4 mg Oral Q12H  . mometasone-formoterol  2 puff Inhalation BID  . multivitamin with minerals  1 tablet Oral Daily  . nicotine  7 mg Transdermal Daily  . pantoprazole  40 mg Oral Daily  . sodium chloride flush  3 mL Intravenous Q12H  . thiamine  100 mg Oral Daily   Or  . thiamine  100 mg Intravenous Daily  . topiramate  50 mg Oral QHS   Continuous Infusions: . sodium chloride    . diltiazem (CARDIZEM) infusion Stopped (02/19/18 0330)     LOS: 1 day      Calvert CantorSaima Moniqua Engebretsen, MD [facility] Pager: [email or web address] Password [email or web address] 02/19/2018, 1:40 PM

## 2018-02-20 ENCOUNTER — Ambulatory Visit (HOSPITAL_COMMUNITY): Payer: 59

## 2018-02-20 DIAGNOSIS — R9431 Abnormal electrocardiogram [ECG] [EKG]: Secondary | ICD-10-CM

## 2018-02-20 LAB — CBC
HEMATOCRIT: 30.3 % — AB (ref 36.0–46.0)
Hemoglobin: 9.3 g/dL — ABNORMAL LOW (ref 12.0–15.0)
MCH: 26.6 pg (ref 26.0–34.0)
MCHC: 30.7 g/dL (ref 30.0–36.0)
MCV: 86.8 fL (ref 80.0–100.0)
Platelets: 126 10*3/uL — ABNORMAL LOW (ref 150–400)
RBC: 3.49 MIL/uL — ABNORMAL LOW (ref 3.87–5.11)
RDW: 17.2 % — ABNORMAL HIGH (ref 11.5–15.5)
WBC: 4.2 10*3/uL (ref 4.0–10.5)
nRBC: 0 % (ref 0.0–0.2)

## 2018-02-20 LAB — BASIC METABOLIC PANEL
Anion gap: 8 (ref 5–15)
BUN: <5 mg/dL — ABNORMAL LOW (ref 6–20)
CO2: 27 mmol/L (ref 22–32)
Calcium: 8.1 mg/dL — ABNORMAL LOW (ref 8.9–10.3)
Chloride: 101 mmol/L (ref 98–111)
Creatinine, Ser: 0.56 mg/dL (ref 0.44–1.00)
GFR calc Af Amer: >60 mL/min (ref >60)
GFR calc non Af Amer: >60 mL/min (ref >60)
Glucose, Bld: 92 mg/dL (ref 70–99)
Potassium: 3.3 mmol/L — ABNORMAL LOW (ref 3.5–5.1)
Sodium: 136 mmol/L (ref 135–145)

## 2018-02-20 LAB — ECHOCARDIOGRAM COMPLETE
Height: 62 in
Weight: 2960 oz

## 2018-02-20 LAB — MAGNESIUM: Magnesium: 1.6 mg/dL — ABNORMAL LOW (ref 1.7–2.4)

## 2018-02-20 MED ORDER — LORAZEPAM 2 MG/ML IJ SOLN
2.0000 mg | Freq: Once | INTRAMUSCULAR | Status: AC
  Administered 2018-02-20: 2 mg via INTRAVENOUS

## 2018-02-20 MED ORDER — TRAZODONE HCL 50 MG PO TABS
50.0000 mg | ORAL_TABLET | Freq: Every day | ORAL | Status: DC
  Administered 2018-02-20: 50 mg via ORAL

## 2018-02-20 MED ORDER — POTASSIUM CHLORIDE CRYS ER 20 MEQ PO TBCR
40.0000 meq | EXTENDED_RELEASE_TABLET | ORAL | Status: DC
  Administered 2018-02-20: 40 meq via ORAL
  Filled 2018-02-20: qty 2

## 2018-02-20 MED ORDER — MAGNESIUM SULFATE 2 GM/50ML IV SOLN
2.0000 g | Freq: Once | INTRAVENOUS | Status: AC
  Administered 2018-02-20: 2 g via INTRAVENOUS
  Filled 2018-02-20: qty 50

## 2018-02-20 MED ORDER — LORAZEPAM 2 MG/ML IJ SOLN: 1.0000 mg | Freq: Once | INTRAMUSCULAR | Status: DC

## 2018-02-20 NOTE — Progress Notes (Signed)
Text paged [facility] since patient is becoming very agitated and is wanting to go home at the present time, attempted to calm her and to reorient to the time and situation but she doesn't want to listen, charge RN called and awaiting further orders, CIWA 17 at this time.

## 2018-02-20 NOTE — Clinical Social Work Note (Signed)
Clinical Social Work Assessment  Patient Details  Name: Anne Black MRN: [id number] Date of Birth: 09-25-1960  Date of referral:  02/20/18               Reason for consult:  Substance Use/ETOH Abuse                Permission sought to share information with:    Permission granted to share information::     Name::        Agency::     Relationship::     Contact Information:     Housing/Transportation Living arrangements for the past 2 months:  Single Family Home Source of Information:  Patient Patient Interpreter Needed:  None Criminal Activity/Legal Involvement Pertinent to Current Situation/Hospitalization:  No - Comment as needed Significant Relationships:  Adult Children Lives with:  Self Do you feel safe going back to the place where you live?  Yes Need for family participation in patient care:  No (Coment)  Care giving concerns: Patient from home. CSW consulted for substance use.    Social Worker assessment / plan: CSW met with patient at bedside. Patient alert and oriented, though very anxious. CSW introduced self and discussed reason for consult.   Patient reported she came into the ED for detox and for heart problems. Patient stated she wants to get treatment for her alcohol use. CSW provided list of local outpatient and residential treatment resources. Patient reviewed the list and for most of the facilities indicated that she had already been a patient and they would not accept her back.  Patient stated she has been to a treatment center called [facility] in [place] and they provided transportation for her to and from [facility]. She stated she wanted to go there again. CSW called [facility] with patient in the room on speaker phone, and their staff confirmed patient had been with them before. However, due to her incontinence and mobility concerns, they would need to speak to an RN at the hospital to determine if she could be managed in their facility or not. Patient then declined  to consent for RN to call [facility] for nurse to nurse call.  Patient requested help with getting to a treatment facility called [facility] in [place]. CSW planned with patient to look up referral needs for that facility. However, before CSW could return to meet with patient again, patient had left the hospital AMA. CSW signing off.   Employment status:  Disabled (Comment on whether or not currently receiving Disability) Insurance information:  Medicare PT Recommendations:  Not assessed at this time Information / Referral to community resources:  Residential Substance Abuse Treatment Options, Outpatient Substance Abuse Treatment Options  Patient/Family's Response to care: Patient left AMA.  Patient/Family's Understanding of and Emotional Response to Diagnosis, Current Treatment, and Prognosis: Patient anxious and ambivalent about treatment. Stated she wants treatment but did not remain in hospital for remainder of withdrawal and for additional support in contacting treatment centers.  Emotional Assessment Appearance:  Appears stated age Attitude/Demeanor/Rapport:  Engaged Affect (typically observed):  Restless, Anxious Orientation:  Oriented to Self, Oriented to Place, Oriented to  Time, Oriented to Situation Alcohol / Substance use:  Alcohol Use Psych involvement (Current and /or in the community):  No (Comment)  Discharge Needs  Concerns to be addressed:  Substance Abuse Concerns Readmission within the last 30 days:  Yes Current discharge risk:  Substance Abuse Barriers to Discharge:  No Barriers Identified   Estanislado Emms,  LCSW 02/20/2018, 4:05 PM

## 2018-02-20 NOTE — Progress Notes (Signed)
[facility] actually had placed orders for Trazadone because the patient was agreeing to stay if she could have something for sleep but she started becoming more agitated and calling people on her phone to try to get someone to get her and take her home, actually spoke with [facility] on call person and was given and order to give an additional 2 MG IV Ativan for a CIWA score of 17 and continue to monitor.

## 2018-02-20 NOTE — Progress Notes (Signed)
Patient expressing desire to leave AMA since this morning; patient now stating that she is no longer waiting and is definitely leaving now. Patient is alert and oriented x4. Patient put clothes on and called taxi company. Notified Dr. Butler Denmark who stated that patient may be able to be discharged if she can ambulate and is doing well; relayed this information to patient, but patient refuses to wait. AMA paperwork provided to patient which she signed. Patient left hospital AMA.

## 2018-02-20 NOTE — Progress Notes (Signed)
  Echocardiogram 2D Echocardiogram has been performed.  Gerda Diss 02/20/2018, 1:40 PM

## 2018-02-20 NOTE — Discharge Summary (Addendum)
Physician Discharge Summary  Anne Black FSE:[id number] DOB: 08-May-1960 DOA: 02/18/2018  PCP: No primary care provider on file.  Admit date: 02/18/2018 Discharge date: 02/20/2018  Admitted From: home Disposition: SHE DECIDED TO LEAVE AMA  Recommendations for Outpatient Follow-up:  1. Recommend Cardizem for A-fib-       Discharge Condition: stable CODE STATUS:  Full code   Consultations:  none    Discharge Diagnoses:  Principal Problem:   A-fib (HCC) Active Problems:   Hereditary and idiopathic peripheral neuropathy   GERD (gastroesophageal reflux disease)   Tobacco abuse   Alcohol withdrawal (HCC)   Alcohol use disorder, severe, dependence (HCC)   Bipolar disorder, current episode mixed, moderate (HCC)   Alcoholic cirrhosis of liver with ascites (HCC)   Bipolar I disorder (HCC)   Multiple sclerosis (HCC)   Brief Summary: Anne Black is a 58 y.o.femalewith medical history ofETOH abuse, cirrhosis with ascites, COPD, ongoing smoking (uses O2 "PRN") at home, Bipolar disorder, MS who is trying to quit drinking and comes in to the ED for help with this. Last drink the night before. Noted to have episodes of PAF in the ED and started on a Cardizem infusion. Also complained of some diarrhea without abdominal pain or vomiting. Had mild nausea  Hospital Course:  Principal Problem:   A-fib  - noted to be paroxsymal in the ED,started on a Cardizem infusion and converted to NSR overnight   -she stated to me that she had had A-fib in the past and was treated at [facility]-  it appears she had a cardiology eval in 4/19 for A-fib (in the ED) -  I started her oral Cardizem to prevent RVR - thyroid studies ordered and found to be normal  - CHA2DS2-VASc Score 1    Active Problems:  Alcohol withdrawal  Alcohol use disorder, severe, dependence I examined her this AM:  -  5 mg of Ativan were use after midnight- the patient wanted to leave the hospital but I recommended  waiting until her withdrawal symptoms had decreased - she decided to leave AMA- she has been oriented and understanding of her situation and medical problems - I do feel she was competent to make this decision.  - cont  Folic acid, Thiamine - she went to a residential rehab for about a week after she was discharged from the hospital in Dec but she was told that not stay there due to her severe urinary incontinence - SW consulted  Diarrhea - probably non- infectious- no abdominal tenderness- improved with 1 dose of Lomotil  Hypokalemia - replacing again - likely due to diarrhea and also lasix use- Mg normal   Bipolar disorder, current episode mixed, moderate  - Prozac, Topamax,Trazodone  Alcoholic cirrhosis of liver with ascites  - states she does not take Lactulose- has been quite lucid without it - hold diuretics for now as she may not drink as much as she does at home - follow for resumption  Multiple sclerosis ? - not on medications for this  Hereditary and idiopathic peripheral neuropathy -cont Neurontin  Tobacco abuse - smokes 1/2 ppd- cont nicotine patch- counseled   COPD - cont home meds- she uses O2 'PRN' at home- keep pulse ox > 90% - will see if we can wean to room air     Discharge Exam: Vitals:   02/20/18 1209 02/20/18 1525  BP: (!) 104/59 (!) 115/47  Pulse: 90 (!) 102  Resp: (!) 27   Temp: 98.1 F (  36.7 C)   SpO2: 98% 94%   Vitals:   02/20/18 0600 02/20/18 0800 02/20/18 1209 02/20/18 1525  BP: (!) 97/57 (!) 99/49 (!) 104/59 (!) 115/47  Pulse: 79 78 90 (!) 102  Resp:  (!) 23 (!) 27   Temp:  98.4 F (36.9 C) 98.1 F (36.7 C)   TempSrc:  Oral Oral   SpO2:  98% 98% 94%  Weight:      Height:        General: Pt is alert, awake, not in acute distress Cardiovascular: RRR, S1/S2 +, no rubs, no gallops Respiratory: CTA bilaterally, no wheezing, no rhonchi Abdominal: Soft, NT, ND, bowel sounds + Extremities: no edema, no  cyanosis   Discharge Instructions   Allergies as of 02/20/2018   No Known Allergies      No Known Allergies   Procedures/Studies: Study Conclusions  - Left ventricle: The cavity size was normal. Wall thickness was   normal. Systolic function was vigorous. The estimated ejection   fraction was in the range of 65% to 70%. Wall motion was normal;   there were no regional wall motion abnormalities. Doppler   parameters are consistent with abnormal left ventricular   relaxation (grade 1 diastolic dysfunction). The E/e&' ratio is   between 8-15, suggesting indeterminate LV filling pressure. - Left atrium: The atrium was normal in size. - Tricuspid valve: There was trivial regurgitation. - Pulmonary arteries: PA peak pressure: 21 mm Hg (S). - Inferior vena cava: The vessel was dilated. The respirophasic   diameter changes were blunted (< 50%), consistent with elevated   central venous pressure.  Impressions:  - Essentially normal study. IVC was dilated, but RVSP is normal.  Dg Chest 2 View  Result Date: 01/25/2018 CLINICAL DATA:  Abdominal pain with nausea and vomiting. History of cirrhosis. EXAM: CHEST - 2 VIEW COMPARISON:  Chest x-ray dated January 20, 2018. FINDINGS: The heart size and mediastinal contours are within normal limits. Normal pulmonary vascularity. Unchanged linear scarring/atelectasis at the lung bases. No focal consolidation, pleural effusion, or pneumothorax. No acute osseous abnormality. IMPRESSION: No active cardiopulmonary disease. Electronically Signed   By: Obie DredgeWilliam T Derry M.D.   On: 01/25/2018 20:11      The results of significant diagnostics from this hospitalization (including imaging, microbiology, ancillary and laboratory) are listed below for reference.     Microbiology: No results found for this or any previous visit (from the past 240 hour(s)).   Labs: BNP (last 3 results) Recent Labs    06/12/17 1005 08/28/17 0642 01/25/18 2048  BNP  115.0* 49.6 109.9*   Basic Metabolic Panel: Recent Labs  Lab 02/18/18 1512 02/19/18 0406 02/20/18 0410  NA 139 136 136  K 3.6 2.8* 3.3*  CL 105 101 101  CO2 23 28 27   GLUCOSE 86 102* 92  BUN <5* 6 <5*  CREATININE 0.56 0.54 0.56  CALCIUM 7.7* 8.0* 8.1*  MG 1.4* 1.8 1.6*   Liver Function Tests: Recent Labs  Lab 02/18/18 1512  AST 34  ALT 15  ALKPHOS 88  BILITOT 1.6*  PROT 6.5  ALBUMIN 3.1*   Recent Labs  Lab 02/18/18 1512  LIPASE 24   No results for input(s): AMMONIA in the last 168 hours. CBC: Recent Labs  Lab 02/18/18 1512 02/19/18 0406 02/20/18 0410  WBC 4.4 4.2 4.2  NEUTROABS 3.6  --   --   HGB 10.2* 9.2* 9.3*  HCT 32.5* 30.0* 30.3*  MCV 87.4 86.0 86.8  PLT 154 117* 126*  Cardiac Enzymes: No results for input(s): CKTOTAL, CKMB, CKMBINDEX, TROPONINI in the last 168 hours. BNP: Invalid input(s): POCBNP CBG: No results for input(s): GLUCAP in the last 168 hours. D-Dimer No results for input(s): DDIMER in the last 72 hours. Hgb A1c No results for input(s): HGBA1C in the last 72 hours. Lipid Profile No results for input(s): CHOL, HDL, LDLCALC, TRIG, CHOLHDL, LDLDIRECT in the last 72 hours. Thyroid function studies Recent Labs    02/18/18 1944  TSH 2.180   Anemia work up No results for input(s): VITAMINB12, FOLATE, FERRITIN, TIBC, IRON, RETICCTPCT in the last 72 hours. Urinalysis    Component Value Date/Time   COLORURINE YELLOW 08/28/2017 0627   APPEARANCEUR CLEAR 08/28/2017 0627   LABSPEC 1.008 08/28/2017 0627   PHURINE 5.0 08/28/2017 0627   GLUCOSEU NEGATIVE 08/28/2017 0627   HGBUR MODERATE (A) 08/28/2017 0627   BILIRUBINUR NEGATIVE 08/28/2017 0627   BILIRUBINUR moderate (A) 06/19/2015 1906   KETONESUR NEGATIVE 08/28/2017 0627   PROTEINUR NEGATIVE 08/28/2017 0627   UROBILINOGEN >=8.0 06/19/2015 1906   UROBILINOGEN 0.2 09/14/2011 2141   NITRITE NEGATIVE 08/28/2017 0627   LEUKOCYTESUR NEGATIVE 08/28/2017 0627   Sepsis Labs Invalid  input(s): PROCALCITONIN,  WBC,  LACTICIDVEN Microbiology No results found for this or any previous visit (from the past 240 hour(s)).   Time coordinating discharge in minutes: 60  SIGNED:   Calvert Cantor, MD  [facility] 02/20/2018, 3:32 PM Pager   If 7PM-7AM, please contact night-coverage [email or web address] Password [email or web address]

## 2018-02-23 ENCOUNTER — Encounter (HOSPITAL_COMMUNITY): Payer: Self-pay

## 2018-02-23 ENCOUNTER — Emergency Department (HOSPITAL_COMMUNITY)
Admission: EM | Admit: 2018-02-23 | Discharge: 2018-02-23 | Disposition: A | Discharge status: Home / Self Care | Payer: 59 | Attending: Emergency Medicine | Admitting: Emergency Medicine

## 2018-02-23 ENCOUNTER — Emergency Department (HOSPITAL_COMMUNITY): Payer: 59

## 2018-02-23 ENCOUNTER — Other Ambulatory Visit: Payer: Self-pay

## 2018-02-23 DIAGNOSIS — Z79899 Other long term (current) drug therapy: Secondary | ICD-10-CM | POA: Insufficient documentation

## 2018-02-23 DIAGNOSIS — F17210 Nicotine dependence, cigarettes, uncomplicated: Secondary | ICD-10-CM | POA: Insufficient documentation

## 2018-02-23 DIAGNOSIS — F1010 Alcohol abuse, uncomplicated: Secondary | ICD-10-CM

## 2018-02-23 DIAGNOSIS — J449 Chronic obstructive pulmonary disease, unspecified: Secondary | ICD-10-CM | POA: Diagnosis not present

## 2018-02-23 DIAGNOSIS — R110 Nausea: Secondary | ICD-10-CM

## 2018-02-23 LAB — COMPREHENSIVE METABOLIC PANEL
ALBUMIN: 3.2 g/dL — AB (ref 3.5–5.0)
ALT: 14 U/L (ref 0–44)
AST: 27 U/L (ref 15–41)
Alkaline Phosphatase: 89 U/L (ref 38–126)
Anion gap: 9 (ref 5–15)
BUN: 5 mg/dL — ABNORMAL LOW (ref 6–20)
CALCIUM: 8.8 mg/dL — AB (ref 8.9–10.3)
CO2: 25 mmol/L (ref 22–32)
Chloride: 103 mmol/L (ref 98–111)
Creatinine, Ser: 0.45 mg/dL (ref 0.44–1.00)
GFR calc Af Amer: >60 mL/min (ref >60)
GFR calc non Af Amer: >60 mL/min (ref >60)
Glucose, Bld: 96 mg/dL (ref 70–99)
Potassium: 4 mmol/L (ref 3.5–5.1)
Sodium: 137 mmol/L (ref 135–145)
TOTAL PROTEIN: 7 g/dL (ref 6.5–8.1)
Total Bilirubin: 1.4 mg/dL — ABNORMAL HIGH (ref 0.3–1.2)

## 2018-02-23 LAB — CBC WITH DIFFERENTIAL/PLATELET
Abs Immature Granulocytes: 0.01 10*3/uL (ref 0.00–0.07)
BASOS PCT: 1 %
Basophils Absolute: 0 10*3/uL (ref 0.0–0.1)
Eosinophils Absolute: 0 10*3/uL (ref 0.0–0.5)
Eosinophils Relative: 0 %
HCT: 34.3 % — ABNORMAL LOW (ref 36.0–46.0)
Hemoglobin: 10.8 g/dL — ABNORMAL LOW (ref 12.0–15.0)
Immature Granulocytes: 0 %
Lymphocytes Relative: 9 %
Lymphs Abs: 0.5 10*3/uL — ABNORMAL LOW (ref 0.7–4.0)
MCH: 27.6 pg (ref 26.0–34.0)
MCHC: 31.5 g/dL (ref 30.0–36.0)
MCV: 87.5 fL (ref 80.0–100.0)
Monocytes Absolute: 0.4 10*3/uL (ref 0.1–1.0)
Monocytes Relative: 8 %
Neutro Abs: 4.2 10*3/uL (ref 1.7–7.7)
Neutrophils Relative %: 82 %
Platelets: 111 10*3/uL — ABNORMAL LOW (ref 150–400)
RBC: 3.92 MIL/uL (ref 3.87–5.11)
RDW: 17.3 % — ABNORMAL HIGH (ref 11.5–15.5)
WBC: 5.1 10*3/uL (ref 4.0–10.5)
nRBC: 0 % (ref 0.0–0.2)

## 2018-02-23 LAB — ETHANOL: Alcohol, Ethyl (B): <10 mg/dL (ref <10)

## 2018-02-23 LAB — MAGNESIUM: Magnesium: 1.6 mg/dL — ABNORMAL LOW (ref 1.7–2.4)

## 2018-02-23 MED ORDER — PROMETHAZINE HCL 25 MG PO TABS: 25.0000 mg | ORAL_TABLET | Freq: Four times a day (QID) | ORAL | 0 refills | Status: DC | PRN

## 2018-02-23 MED ORDER — SODIUM CHLORIDE 0.9 % IV BOLUS
1000.0000 mL | Freq: Once | INTRAVENOUS | Status: AC
  Administered 2018-02-23: 1000 mL via INTRAVENOUS

## 2018-02-23 MED ORDER — MAGNESIUM SULFATE 2 GM/50ML IV SOLN
2.0000 g | Freq: Once | INTRAVENOUS | Status: AC
  Administered 2018-02-23: 2 g via INTRAVENOUS
  Filled 2018-02-23: qty 50

## 2018-02-23 MED ORDER — LOPERAMIDE HCL 2 MG PO CAPS
2.0000 mg | ORAL_CAPSULE | Freq: Once | ORAL | Status: AC
  Administered 2018-02-23: 2 mg via ORAL
  Filled 2018-02-23: qty 1

## 2018-02-23 MED ORDER — FLUTICASONE-SALMETEROL 250-50 MCG/DOSE IN AEPB: 2.0000 | INHALATION_SPRAY | Freq: Two times a day (BID) | RESPIRATORY_TRACT | 0 refills | Status: DC

## 2018-02-23 MED ORDER — ONDANSETRON 4 MG PO TBDP: 4.0000 mg | ORAL_TABLET | Freq: Three times a day (TID) | ORAL | 0 refills | Status: DC | PRN

## 2018-02-23 MED ORDER — VENTOLIN HFA 108 (90 BASE) MCG/ACT IN AERS: 2.0000 | INHALATION_SPRAY | RESPIRATORY_TRACT | 0 refills | Status: DC | PRN

## 2018-02-23 MED ORDER — CHLORDIAZEPOXIDE HCL 25 MG PO CAPS: ORAL_CAPSULE | ORAL | 0 refills | Status: DC

## 2018-02-23 MED ORDER — MAGNESIUM OXIDE 400 MG PO TABS: 400.0000 mg | ORAL_TABLET | Freq: Every day | ORAL | 0 refills | Status: DC

## 2018-02-23 MED ORDER — THIAMINE HCL 100 MG/ML IJ SOLN
100.0000 mg | Freq: Every day | INTRAMUSCULAR | Status: DC
  Administered 2018-02-23: 100 mg via INTRAVENOUS
  Filled 2018-02-23: qty 2

## 2018-02-23 MED ORDER — LORAZEPAM 1 MG PO TABS
0.0000 mg | ORAL_TABLET | Freq: Four times a day (QID) | ORAL | Status: DC
  Administered 2018-02-23: 1 mg via ORAL
  Filled 2018-02-23: qty 1

## 2018-02-23 MED ORDER — PROMETHAZINE HCL 25 MG/ML IJ SOLN: 25.0000 mg | Freq: Once | INTRAMUSCULAR | Status: DC

## 2018-02-23 MED ORDER — VITAMIN B-1 100 MG PO TABS: 100.0000 mg | ORAL_TABLET | Freq: Every day | ORAL | Status: DC

## 2018-02-23 MED ORDER — LORAZEPAM 1 MG PO TABS: 0.0000 mg | ORAL_TABLET | Freq: Two times a day (BID) | ORAL | Status: DC

## 2018-02-23 MED ORDER — LORAZEPAM 2 MG/ML IJ SOLN
0.0000 mg | Freq: Four times a day (QID) | INTRAMUSCULAR | Status: DC
  Administered 2018-02-23: 2 mg via INTRAVENOUS
  Filled 2018-02-23: qty 1

## 2018-02-23 MED ORDER — LORAZEPAM 2 MG/ML IJ SOLN: 0.0000 mg | Freq: Two times a day (BID) | INTRAMUSCULAR | Status: DC

## 2018-02-23 NOTE — Progress Notes (Signed)
CSW received consult for home health. CSW spoke with pt at bedside and was informed that pt is interested in home health services. CSW aware that face to face has been placed and that pt has no preference of home health agency at this time. CSW to update RNCM of need.      Claude Manges Lopez Dentinger, MSW, LCSW-A Emergency Department Clinical Social Worker [phone]

## 2018-02-23 NOTE — ED Notes (Signed)
Pt requested SW, informed EDP

## 2018-02-23 NOTE — Discharge Planning (Signed)
   Home health agencies that serve [phone]. Your favorite home health agencies  Home Health Agencies Search Results  Results List Table Home Health Agency Information Quality of Patient Care Rating Patient Survey Summary Rating  [facility]  [phone] 4 out of 5 stars 4 out of 5 stars  [facility]  [phone] 4  out of 5 stars 4 out of 5 stars  [facility], [place]  [phone] 4  out of 5 stars 4 out of 5 stars  [facility]  [phone] 4 out of 5 stars 4 out of 5 stars  [facility]  [phone] 4 out of 5 stars 4 out of 5 stars  [facility]  [phone] 3 out of 5 stars 4 out of 5 stars  [facility]  [phone] 3  out of 5 stars Not Available11  [facility]  [phone] 3 out of 5 stars 4 out of 5 stars  [facility]  [phone] 3  out of 5 stars 3 out of 5 stars  [facility]  [phone] 5 out of 5 stars 3 out of 5 stars  [facility], [place]  [phone]

## 2018-02-23 NOTE — ED Triage Notes (Signed)
Pt with long standing ETOH addiction arrives to ED with EMS for request for ETOH detox.  Pt is alert and oriented and reports resolved nausea at this time.  Pt has visual tremors at rest.  Pt reports last drink to EMS was last night.  Pt reports usually drinking a bag of white wine a day.

## 2018-02-23 NOTE — ED Provider Notes (Signed)
[facility] EMERGENCY DEPARTMENT Provider Note   CSN: [id number] Arrival date & time: 02/23/18  0957     History   Chief Complaint Chief Complaint  Patient presents with  . Alcohol Problem    HPI Anne Black is a 58 y.o. female.  Pt presents to the ED today wanting detox from alcohol.  Last drink was last night around 2300.  She usually drinks 1-2 bottles of wine per day.  Pt just left the hospital on 1/13 [facility] after getting admitted on 1/11 for afib with rvr.  The SW was trying to get pt into a treatment facility when pt left.  EMS said pt's house was extremely dirty and she had lots of medications that were filled, but not taken.  They plan to make a referral to APS.  Pt denies cp, but have some sob this morning.  She feels nauseous, but took a phenergan at home which has helped a little.  Pt c/o abdominal pain today.     Past Medical History:  Diagnosis Date  . ADHD (attention deficit hyperactivity disorder)   . Alcoholism (HCC)   . Anxiety   . Arthritis   . Ascites   . Bipolar disorder (HCC)   . Cirrhosis (HCC)   . COPD (chronic obstructive pulmonary disease) (HCC)   . Hypokalemia   . Migraine   . Multiple sclerosis (HCC)   . Narcolepsy   . Osteoporosis     Patient Active Problem List   Diagnosis Date Noted  . A-fib (HCC) 02/18/2018  . Community acquired pneumonia of left lower lobe of lung (HCC)   . Weakness 08/21/2017  . COPD exacerbation (HCC) 06/12/2017  . Chest pain with moderate risk for cardiac etiology 05/30/2017  . Abnormal LFTs 05/30/2017  . Tachycardia 05/30/2017  . COPD (chronic obstructive pulmonary disease) (HCC) 04/19/2017  . Right rib fracture 04/15/2017  . Abdominal wall cellulitis 12/30/2016  . S/P exploratory laparotomy 12/30/2016  . Distal radius fracture, left 09/09/2016  . Thrombocytopenia (HCC) 09/09/2016  . Chest x-ray abnormality   . Umbilical hernia without obstruction and without gangrene 05/12/2016  .  Itching 02/24/2016  . Ventral hernia without obstruction or gangrene 02/24/2016  . Palliative care encounter   . Ascites   . Tachypnea   . Atrial flutter with rapid ventricular response (HCC)   . Hypomagnesemia   . Hypoxia   . Dehydration 01/22/2016  . Low back ache 12/30/2015  . Non-intractable vomiting with nausea   . Alcohol abuse with alcohol-induced mood disorder (HCC) 12/15/2015  . Acute alcoholism (HCC)   . Abdominal distension 11/04/2015  . Alcohol abuse   . Dyspnea   . Acute respiratory failure (HCC) 10/15/2015  . Ascites due to alcoholic cirrhosis (HCC) 10/15/2015  . Osteoporosis   . Bipolar I disorder (HCC)   . Multiple sclerosis (HCC)   . Alcoholic cirrhosis of liver with ascites (HCC) 08/22/2015  . Bipolar disorder, current episode mixed, moderate (HCC) 08/01/2015  . Alcohol use disorder, severe, dependence (HCC) 07/31/2015  . Macrocytic anemia- due to alcohol abuse with normal B12 & folate levels 05/31/2015  . Severe protein-calorie malnutrition (HCC) 05/31/2015  . Hypokalemia 05/26/2015  . Encephalopathy, hepatic (HCC) 05/26/2015  . Alcohol withdrawal (HCC) 05/18/2015  . Abdominal pain 05/18/2015  . Anxiety 05/18/2015  . Severe sepsis (HCC) 05/18/2015  . UTI (lower urinary tract infection) 05/18/2015  . Stimulant abuse (HCC) 12/27/2014  . Nicotine abuse 12/27/2014  . Hyperprolactinemia (HCC) 11/15/2014  . Dyslipidemia   .  Alcohol use disorder, moderate, dependence (HCC) 10/30/2014  . Tobacco abuse 01/23/2014  . Noncompliance with therapeutic plan 04/04/2013  . Hereditary and idiopathic peripheral neuropathy 05/01/2012  . GERD (gastroesophageal reflux disease) 05/01/2012  . Osteoarthrosis, unspecified whether generalized or localized, involving lower leg 05/01/2012  . Hyponatremia 07/01/2011    Past Surgical History:  Procedure Laterality Date  . ABDOMINAL WALL DEFECT REPAIR N/A 12/30/2016   Procedure: EXPLORATORY LAPAROTOMY WITH REPAIR ABDOMINAL WALL  VENTRAL HERNIA;  Surgeon: Emelia LoronWakefield, Matthew, MD;  [facility];  Service: General;  Laterality: N/A;  . APPLICATION OF WOUND VAC N/A 12/30/2016   Procedure: APPLICATION OF WOUND VAC;  Surgeon: Emelia LoronWakefield, Matthew, MD;  [facility];  Service: General;  Laterality: N/A;  . CESAREAN SECTION  [id number]  . FRACTURE SURGERY    . HERNIA REPAIR    . MYRINGOTOMY WITH TUBE PLACEMENT Bilateral   . ORIF WRIST FRACTURE Left 09/09/2016   Procedure: OPEN REDUCTION INTERNAL FIXATION (ORIF) LEFT WRIST FRACTURE, LEFT CARPAL TUNNEL RELEASE;  Surgeon: Dominica SeverinGramig, William, MD;  Location: [facility] ORS;  Service: Orthopedics;  Laterality: Left;  . TONSILLECTOMY       OB History   No obstetric history on file.      Home Medications    Prior to Admission medications   Medication Sig Start Date End Date Taking? Authorizing Provider  aluminum-magnesium hydroxide-simethicone (MAALOX) 200-200-20 MG/5ML SUSP Take 30 mLs by mouth 4 (four) times daily -  before meals and at bedtime.   Yes [provider]  benzonatate (TESSALON) 100 MG capsule Take 100 mg by mouth 2 (two) times daily as needed for cough.  12/16/17  Yes [provider]  bismuth subsalicylate (PEPTO-BISMOL) 262 MG chewable tablet Chew 2 tablets (524 mg total) by mouth as needed for diarrhea or loose stools. Patient taking differently: Chew 786 mg by mouth daily as needed for indigestion or diarrhea or loose stools.  07/08/17  Yes Molpus, John, MD  FLUoxetine (PROZAC) 20 MG capsule Take 20 mg by mouth daily. 12/31/17  Yes [provider]  folic acid (FOLVITE) 1 MG tablet Take 1 tablet (1 mg total) by mouth daily. 01/20/18  Yes Leroy SeaSingh, Prashant K, MD  furosemide (LASIX) 40 MG tablet Take 1 tablet (40 mg total) by mouth daily. Patient taking differently: Take 40 mg by mouth as needed for fluid.  06/17/16  Yes Midge MiniumWohl, Darren, MD  gabapentin (NEURONTIN) 100 MG capsule Take 2 capsules (200 mg total) by mouth 3 (three) times daily. Patient  taking differently: Take 300 mg by mouth 3 (three) times daily.  08/31/17  Yes Rolan BuccoBelfi, Melanie, MD  hydrocortisone cream 1 % Apply topically 3 (three) times daily. Patient taking differently: Apply 1 application topically daily as needed for itching.  04/20/17  Yes Sheikh, Omair Latif, DO  HYDROXYZINE HCL PO Take 1 tablet by mouth daily as needed (anxiety).    Yes [provider]  loperamide (IMODIUM A-D) 2 MG tablet Take 6 mg by mouth daily as needed for diarrhea or loose stools.   Yes [provider]  nicotine (NICODERM CQ - DOSED IN MG/24 HOURS) 14 mg/24hr patch Place 1 patch onto the skin daily. 12/19/17  Yes [provider]  pantoprazole (PROTONIX) 40 MG tablet Take 40 mg by mouth daily.   Yes [provider]  potassium chloride SA (K-DUR,KLOR-CON) 20 MEQ tablet Take 1 tablet (20 mEq total) by mouth daily. 01/10/18  Yes Cathren LaineSteinl, Kevin, MD  rizatriptan (MAXALT) 5 MG tablet Take 5 mg by  mouth every 2 (two) hours as needed for migraine.  12/31/17  Yes [provider]  thiamine 100 MG tablet Take 1 tablet (100 mg total) by mouth daily. 01/20/18  Yes Leroy Sea, MD  topiramate (TOPAMAX) 50 MG tablet Take 50 mg by mouth at bedtime.    Yes [provider]  traZODone (DESYREL) 50 MG tablet Take 50 mg by mouth at bedtime as needed for sleep.  12/31/17  Yes [provider]  chlordiazePOXIDE (LIBRIUM) 25 MG capsule 50mg  PO TID x 1D, then 25-50mg  PO BID X 1D, then 25-50mg  PO QD X 1D 02/23/18   Jacalyn Lefevre, MD  Fluticasone-Salmeterol (ADVAIR) 250-50 MCG/DOSE AEPB Inhale 2 puffs into the lungs 2 (two) times daily. 02/23/18   Jacalyn Lefevre, MD  ipratropium-albuterol (DUONEB) 0.5-2.5 (3) MG/3ML SOLN Use twice a day scheduled and every 4 hours as needed for shortness of breath and wheezing Patient not taking: Reported on 01/25/2018 01/20/18   Leroy Sea, MD  lactulose (CHRONULAC) 10 GM/15ML solution Take 30 mLs (20 g total) by mouth 2  (two) times daily. Patient not taking: Reported on 01/25/2018 01/20/18   Leroy Sea, MD  magnesium oxide (MAG-OX) 400 MG tablet Take 1 tablet (400 mg total) by mouth daily. 02/23/18   Jacalyn Lefevre, MD  ondansetron (ZOFRAN ODT) 4 MG disintegrating tablet Take 1 tablet (4 mg total) by mouth every 8 (eight) hours as needed. 02/23/18   Jacalyn Lefevre, MD  promethazine (PHENERGAN) 25 MG tablet Take 1 tablet (25 mg total) by mouth every 6 (six) hours as needed for nausea or vomiting. 02/23/18   Jacalyn Lefevre, MD  VENTOLIN HFA 108 (90 Base) MCG/ACT inhaler Inhale 2 puffs into the lungs every 4 (four) hours as needed for wheezing or shortness of breath. 02/23/18   Jacalyn Lefevre, MD    Family History Family History  Problem Relation Age of Onset  . Arrhythmia Mother   . Heart disease Father   . Hypertension Father     Social History Social History   Tobacco Use  . Smoking status: Current Every Day Smoker    Packs/day: 1.00    Years: 35.00    Pack years: 35.00    Types: Cigarettes  . Smokeless tobacco: Never Used  Substance Use Topics  . Alcohol use: Yes    Comment: 1 bottle of wine per day per patient.   . Drug use: No     Allergies   Patient has no known allergies.   Review of Systems Review of Systems  Respiratory: Positive for shortness of breath.   Gastrointestinal: Positive for abdominal pain and nausea.  Neurological: Positive for tremors.  All other systems reviewed and are negative.    Physical Exam Updated Vital Signs BP 120/61   Pulse 91   Temp 98.4 F (36.9 C) (Oral)   Resp (!) 24   Ht 5\' 2"  (1.575 m)   Wt 84 kg   SpO2 98%   BMI 33.87 kg/m   Physical Exam Vitals signs and nursing note reviewed.  Constitutional:      Appearance: Normal appearance.  HENT:     Head: Normocephalic and atraumatic.     Right Ear: External ear normal.     Left Ear: External ear normal.     Nose: Nose normal.     Mouth/Throat:     Mouth: Mucous membranes are  dry.  Eyes:     Extraocular Movements: Extraocular movements intact.     Conjunctiva/sclera: Conjunctivae normal.  Pupils: Pupils are equal, round, and reactive to light.  Neck:     Musculoskeletal: Normal range of motion and neck supple.  Cardiovascular:     Rate and Rhythm: Tachycardia present.     Pulses: Normal pulses.     Heart sounds: Normal heart sounds.  Pulmonary:     Effort: Pulmonary effort is normal.     Breath sounds: Normal breath sounds.  Abdominal:     General: Abdomen is flat.     Tenderness: There is generalized abdominal tenderness.  Skin:    General: Skin is warm.     Capillary Refill: Capillary refill takes less than 2 seconds.  Neurological:     General: No focal deficit present.     Mental Status: She is alert and oriented to person, place, and time.     Motor: Tremor present.  Psychiatric:        Mood and Affect: Mood normal.        Behavior: Behavior normal.        Thought Content: Thought content normal.        Judgment: Judgment normal.      ED Treatments / Results  Labs (all labs ordered are listed, but only abnormal results are displayed) Labs Reviewed  COMPREHENSIVE METABOLIC PANEL - Abnormal; Notable for the following components:      Result Value   BUN 5 (*)    Calcium 8.8 (*)    Albumin 3.2 (*)    Total Bilirubin 1.4 (*)    All other components within normal limits  CBC WITH DIFFERENTIAL/PLATELET - Abnormal; Notable for the following components:   Hemoglobin 10.8 (*)    HCT 34.3 (*)    RDW 17.3 (*)    Platelets 111 (*)    Lymphs Abs 0.5 (*)    All other components within normal limits  MAGNESIUM - Abnormal; Notable for the following components:   Magnesium 1.6 (*)    All other components within normal limits  ETHANOL  URINALYSIS, ROUTINE W REFLEX MICROSCOPIC    EKG EKG Interpretation  Date/Time:  Thursday February 23 2018 10:15:30 EST Ventricular Rate:  89 PR Interval:    QRS Duration: 96 QT Interval:  404 QTC  Calculation: 492 R Axis:   78 Text Interpretation:  Sinus rhythm Borderline T abnormalities, anterior leads Borderline prolonged QT interval Baseline wander in lead(s) V3 No significant change since last tracing Confirmed by Jacalyn Lefevre [phone]) on 02/23/2018 10:23:41 AM   Radiology Dg Chest 2 View  Result Date: 02/23/2018 CLINICAL DATA:  ETOH addiction for detox EXAM: CHEST - 2 VIEW COMPARISON:  January 25, 2018 FINDINGS: The heart size and mediastinal contours are within normal limits. Mild scar is identified in the right lung base. There is no focal infiltrate, pulmonary edema, or pleural effusion. The visualized skeletal structures are stable. IMPRESSION: No active cardiopulmonary disease. Electronically Signed   By: Sherian Rein M.D.   On: 02/23/2018 11:45    Procedures Procedures (including critical care time)  Medications Ordered in ED Medications  LORazepam (ATIVAN) injection 0-4 mg (2 mg Intravenous Given 02/23/18 1037)    Or  LORazepam (ATIVAN) tablet 0-4 mg ( Oral See Alternative 02/23/18 1037)  LORazepam (ATIVAN) injection 0-4 mg (has no administration in time range)    Or  LORazepam (ATIVAN) tablet 0-4 mg (has no administration in time range)  thiamine (VITAMIN B-1) tablet 100 mg ( Oral See Alternative 02/23/18 1036)    Or  thiamine (B-1) injection 100 mg (100  mg Intravenous Given 02/23/18 1036)  promethazine (PHENERGAN) injection 25 mg (has no administration in time range)  sodium chloride 0.9 % bolus 1,000 mL (0 mLs Intravenous Stopped 02/23/18 1133)  loperamide (IMODIUM) capsule 2 mg (2 mg Oral Given 02/23/18 1036)  magnesium sulfate IVPB 2 g 50 mL (0 g Intravenous Stopped 02/23/18 1324)     Initial Impression / Assessment and Plan / ED Course  I have reviewed the triage vital signs and the nursing notes.  Pertinent labs & imaging results that were available during my care of the patient were reviewed by me and considered in my medical decision making (see chart for  details).    Pt requests to speak with SW.  She also requests home health.  Face to face put in epic.  She requests a refill on phenergan, zofran, and her inhalers.  Pt d/w SW and with CM.  She has had home health in the past, but she's been intoxicated or had firearms out which she refused to put away.  CM will attempt to find a group which will take her.    Pt is not in acute alcohol withdrawal now.  Vitals are stable.  She is ok for d/c.  Return if worse.  Final Clinical Impressions(s) / ED Diagnoses   Final diagnoses:  Alcohol abuse  Hypomagnesemia  Nausea    ED Discharge Orders         Ordered    VENTOLIN HFA 108 (90 Base) MCG/ACT inhaler  Every 4 hours PRN     02/23/18 1446    Fluticasone-Salmeterol (ADVAIR) 250-50 MCG/DOSE AEPB  2 times daily     02/23/18 1446    promethazine (PHENERGAN) 25 MG tablet  Every 6 hours PRN     02/23/18 1446    ondansetron (ZOFRAN ODT) 4 MG disintegrating tablet  Every 8 hours PRN     02/23/18 1446    chlordiazePOXIDE (LIBRIUM) 25 MG capsule     02/23/18 1446    magnesium oxide (MAG-OX) 400 MG tablet  Daily     02/23/18 1446           Jacalyn LefevreHaviland, Daymein Nunnery, MD 02/23/18 1513

## 2018-02-23 NOTE — Discharge Planning (Signed)
EDCM met with pt at bedside who states she will set up [facility] through her insurance company.  Her goal is to be placed in SNF but she would rather get this done through home visit.  Pt asked for taxi voucher for transportation home.  EDCM gave taxi voucher to RN.

## 2018-02-23 NOTE — ED Notes (Signed)
Pt alert and oriented in NAD. Pt verbalized understanding of discharge instructions.  Pt received a cab voucher.

## 2018-02-28 ENCOUNTER — Encounter (HOSPITAL_COMMUNITY): Payer: Self-pay

## 2018-02-28 ENCOUNTER — Emergency Department (HOSPITAL_COMMUNITY)
Admission: EM | Admit: 2018-02-28 | Discharge: 2018-02-28 | Disposition: A | Discharge status: Home / Self Care | Payer: 59 | Attending: Emergency Medicine | Admitting: Emergency Medicine

## 2018-02-28 ENCOUNTER — Emergency Department (HOSPITAL_COMMUNITY): Payer: 59

## 2018-02-28 DIAGNOSIS — F17210 Nicotine dependence, cigarettes, uncomplicated: Secondary | ICD-10-CM | POA: Diagnosis not present

## 2018-02-28 DIAGNOSIS — R112 Nausea with vomiting, unspecified: Secondary | ICD-10-CM | POA: Diagnosis present

## 2018-02-28 DIAGNOSIS — K7031 Alcoholic cirrhosis of liver with ascites: Secondary | ICD-10-CM | POA: Diagnosis not present

## 2018-02-28 DIAGNOSIS — J449 Chronic obstructive pulmonary disease, unspecified: Secondary | ICD-10-CM | POA: Diagnosis not present

## 2018-02-28 DIAGNOSIS — Z79899 Other long term (current) drug therapy: Secondary | ICD-10-CM | POA: Insufficient documentation

## 2018-02-28 DIAGNOSIS — R531 Weakness: Secondary | ICD-10-CM | POA: Diagnosis not present

## 2018-02-28 LAB — I-STAT TROPONIN, ED: Troponin i, poc: 0 ng/mL (ref 0.00–0.08)

## 2018-02-28 LAB — COMPREHENSIVE METABOLIC PANEL WITH GFR
ALT: 13 U/L (ref 0–44)
AST: 29 U/L (ref 15–41)
Albumin: 3.8 g/dL (ref 3.5–5.0)
Alkaline Phosphatase: 86 U/L (ref 38–126)
Anion gap: 9 (ref 5–15)
BUN: 8 mg/dL (ref 6–20)
CO2: 23 mmol/L (ref 22–32)
Calcium: 8.4 mg/dL — ABNORMAL LOW (ref 8.9–10.3)
Chloride: 105 mmol/L (ref 98–111)
Creatinine, Ser: 0.34 mg/dL — ABNORMAL LOW (ref 0.44–1.00)
GFR calc Af Amer: >60 mL/min (ref >60)
GFR calc non Af Amer: >60 mL/min (ref >60)
Glucose, Bld: 95 mg/dL (ref 70–99)
Potassium: 3.6 mmol/L (ref 3.5–5.1)
Sodium: 137 mmol/L (ref 135–145)
Total Bilirubin: 1.6 mg/dL — ABNORMAL HIGH (ref 0.3–1.2)
Total Protein: 7.2 g/dL (ref 6.5–8.1)

## 2018-02-28 LAB — URINALYSIS, ROUTINE W REFLEX MICROSCOPIC
Bilirubin Urine: NEGATIVE
Glucose, UA: NEGATIVE mg/dL
Hgb urine dipstick: NEGATIVE
Ketones, ur: 5 mg/dL — AB
Nitrite: NEGATIVE
Protein, ur: NEGATIVE mg/dL
Specific Gravity, Urine: 1.045 — ABNORMAL HIGH (ref 1.005–1.030)
pH: 6 (ref 5.0–8.0)

## 2018-02-28 LAB — CBC WITH DIFFERENTIAL/PLATELET
Abs Immature Granulocytes: 0.01 K/uL (ref 0.00–0.07)
Basophils Absolute: 0 K/uL (ref 0.0–0.1)
Basophils Relative: 1 %
Eosinophils Absolute: 0 K/uL (ref 0.0–0.5)
Eosinophils Relative: 1 %
HCT: 33.4 % — ABNORMAL LOW (ref 36.0–46.0)
Hemoglobin: 10 g/dL — ABNORMAL LOW (ref 12.0–15.0)
Immature Granulocytes: 0 %
Lymphocytes Relative: 13 %
Lymphs Abs: 0.4 K/uL — ABNORMAL LOW (ref 0.7–4.0)
MCH: 27 pg (ref 26.0–34.0)
MCHC: 29.9 g/dL — ABNORMAL LOW (ref 30.0–36.0)
MCV: 90.3 fL (ref 80.0–100.0)
Monocytes Absolute: 0.3 K/uL (ref 0.1–1.0)
Monocytes Relative: 11 %
Neutro Abs: 2.3 K/uL (ref 1.7–7.7)
Neutrophils Relative %: 74 %
Platelets: 92 K/uL — ABNORMAL LOW (ref 150–400)
RBC: 3.7 MIL/uL — ABNORMAL LOW (ref 3.87–5.11)
RDW: 17 % — ABNORMAL HIGH (ref 11.5–15.5)
WBC: 3.1 K/uL — ABNORMAL LOW (ref 4.0–10.5)
nRBC: 0 % (ref 0.0–0.2)

## 2018-02-28 LAB — ETHANOL: Alcohol, Ethyl (B): 61 mg/dL — ABNORMAL HIGH (ref <10)

## 2018-02-28 LAB — MAGNESIUM: Magnesium: 1.7 mg/dL (ref 1.7–2.4)

## 2018-02-28 LAB — LIPASE, BLOOD: Lipase: 31 U/L (ref 11–51)

## 2018-02-28 MED ORDER — IOPAMIDOL (ISOVUE-300) INJECTION 61%
INTRAVENOUS | Status: AC
  Filled 2018-02-28: qty 100

## 2018-02-28 MED ORDER — PANTOPRAZOLE SODIUM 40 MG PO TBEC: 40.0000 mg | DELAYED_RELEASE_TABLET | Freq: Every day | ORAL | 0 refills | Status: DC

## 2018-02-28 MED ORDER — LORAZEPAM 2 MG/ML IJ SOLN
1.0000 mg | Freq: Once | INTRAMUSCULAR | Status: AC
  Administered 2018-02-28: 1 mg via INTRAVENOUS
  Filled 2018-02-28: qty 1

## 2018-02-28 MED ORDER — ADULT MULTIVITAMIN W/MINERALS CH
1.0000 | ORAL_TABLET | Freq: Every day | ORAL | Status: DC
  Administered 2018-02-28: 1 via ORAL
  Filled 2018-02-28: qty 1

## 2018-02-28 MED ORDER — ONDANSETRON HCL 4 MG/2ML IJ SOLN
4.0000 mg | Freq: Once | INTRAMUSCULAR | Status: AC
  Administered 2018-02-28: 4 mg via INTRAVENOUS
  Filled 2018-02-28: qty 2

## 2018-02-28 MED ORDER — LORAZEPAM 2 MG/ML IJ SOLN
0.0000 mg | Freq: Four times a day (QID) | INTRAMUSCULAR | Status: DC
  Administered 2018-02-28: 2 mg via INTRAVENOUS
  Filled 2018-02-28: qty 1

## 2018-02-28 MED ORDER — IOPAMIDOL (ISOVUE-300) INJECTION 61%
100.0000 mL | Freq: Once | INTRAVENOUS | Status: AC | PRN
  Administered 2018-02-28: 100 mL via INTRAVENOUS

## 2018-02-28 MED ORDER — LORAZEPAM 2 MG/ML IJ SOLN: 0.0000 mg | Freq: Two times a day (BID) | INTRAMUSCULAR | Status: DC

## 2018-02-28 MED ORDER — LORAZEPAM 1 MG PO TABS: 1.0000 mg | ORAL_TABLET | Freq: Four times a day (QID) | ORAL | Status: DC | PRN

## 2018-02-28 MED ORDER — ONDANSETRON 4 MG PO TBDP: 4.0000 mg | ORAL_TABLET | Freq: Three times a day (TID) | ORAL | 0 refills | Status: DC | PRN

## 2018-02-28 MED ORDER — FLUTICASONE-SALMETEROL 250-50 MCG/DOSE IN AEPB: 2.0000 | INHALATION_SPRAY | Freq: Two times a day (BID) | RESPIRATORY_TRACT | 0 refills | Status: DC

## 2018-02-28 MED ORDER — PROMETHAZINE HCL 25 MG PO TABS: 25.0000 mg | ORAL_TABLET | Freq: Four times a day (QID) | ORAL | 0 refills | Status: DC | PRN

## 2018-02-28 MED ORDER — GABAPENTIN 300 MG PO CAPS
300.0000 mg | ORAL_CAPSULE | Freq: Once | ORAL | Status: AC
  Administered 2018-02-28: 300 mg via ORAL
  Filled 2018-02-28: qty 1

## 2018-02-28 MED ORDER — LORAZEPAM 2 MG/ML IJ SOLN: 1.0000 mg | Freq: Four times a day (QID) | INTRAMUSCULAR | Status: DC | PRN

## 2018-02-28 MED ORDER — THIAMINE HCL 100 MG/ML IJ SOLN: 100.0000 mg | Freq: Every day | INTRAMUSCULAR | Status: DC

## 2018-02-28 MED ORDER — FOLIC ACID 1 MG PO TABS
1.0000 mg | ORAL_TABLET | Freq: Every day | ORAL | Status: DC
  Administered 2018-02-28: 1 mg via ORAL
  Filled 2018-02-28: qty 1

## 2018-02-28 MED ORDER — SODIUM CHLORIDE (PF) 0.9 % IJ SOLN
INTRAMUSCULAR | Status: AC
  Filled 2018-02-28: qty 50

## 2018-02-28 MED ORDER — SODIUM CHLORIDE 0.9 % IV BOLUS
1000.0000 mL | Freq: Once | INTRAVENOUS | Status: AC
  Administered 2018-02-28: 1000 mL via INTRAVENOUS

## 2018-02-28 MED ORDER — PANTOPRAZOLE SODIUM 40 MG PO TBEC
40.0000 mg | DELAYED_RELEASE_TABLET | Freq: Once | ORAL | Status: AC
  Administered 2018-02-28: 40 mg via ORAL
  Filled 2018-02-28: qty 1

## 2018-02-28 MED ORDER — VITAMIN B-1 100 MG PO TABS
100.0000 mg | ORAL_TABLET | Freq: Every day | ORAL | Status: DC
  Administered 2018-02-28: 100 mg via ORAL
  Filled 2018-02-28: qty 1

## 2018-02-28 MED ORDER — GABAPENTIN 100 MG PO CAPS: 200.0000 mg | ORAL_CAPSULE | Freq: Three times a day (TID) | ORAL | 0 refills | Status: DC

## 2018-02-28 NOTE — ED Notes (Signed)
Social work at bedside.  

## 2018-02-28 NOTE — ED Notes (Signed)
Patient had a bowel movement and soiled herself. Patient cleaned and new brief applied.

## 2018-02-28 NOTE — ED Provider Notes (Signed)
[facility]-EMERGENCY DEPT Provider Note   CSN: [id number] Arrival date & time: 02/28/18  4098     History   Chief Complaint Chief Complaint  Patient presents with  . Emesis  . Diarrhea  . Nausea    HPI Anne Black is a 58 y.o. female history of multiple sclerosis, COPD, alcohol abuse here presenting with nausea, vomiting, weakness, abdominal pain.  Patient states that she has been cutting back on her alcohol consumption and drink some alcohol last night.  Patient states that she was supposed to go to [facility] for a alcohol rehab today but felt nauseated and started throwing up.  Also had some diarrhea and abdominal pain as well.  She then called EMS to be brought here for evaluation.  Did take some Phenergan at home.  States that she feels shaky and she ran out of her gabapentin as well.  She also feels that her multiple sclerosis is flaring up and feels weak all over.  Patient was admitted to the hospital for alcohol withdrawal and electrolyte abnormalities several weeks ago and decided to sign out AGAINST MEDICAL ADVICE.   The history is provided by the patient.    Past Medical History:  Diagnosis Date  . ADHD (attention deficit hyperactivity disorder)   . Alcoholism (HCC)   . Anxiety   . Arthritis   . Ascites   . Bipolar disorder (HCC)   . Cirrhosis (HCC)   . COPD (chronic obstructive pulmonary disease) (HCC)   . Hypokalemia   . Migraine   . Multiple sclerosis (HCC)   . Narcolepsy   . Osteoporosis     Patient Active Problem List   Diagnosis Date Noted  . A-fib (HCC) 02/18/2018  . Community acquired pneumonia of left lower lobe of lung (HCC)   . Weakness 08/21/2017  . COPD exacerbation (HCC) 06/12/2017  . Chest pain with moderate risk for cardiac etiology 05/30/2017  . Abnormal LFTs 05/30/2017  . Tachycardia 05/30/2017  . COPD (chronic obstructive pulmonary disease) (HCC) 04/19/2017  . Right rib fracture 04/15/2017  . Abdominal wall  cellulitis 12/30/2016  . S/P exploratory laparotomy 12/30/2016  . Distal radius fracture, left 09/09/2016  . Thrombocytopenia (HCC) 09/09/2016  . Chest x-ray abnormality   . Umbilical hernia without obstruction and without gangrene 05/12/2016  . Itching 02/24/2016  . Ventral hernia without obstruction or gangrene 02/24/2016  . Palliative care encounter   . Ascites   . Tachypnea   . Atrial flutter with rapid ventricular response (HCC)   . Hypomagnesemia   . Hypoxia   . Dehydration 01/22/2016  . Low back ache 12/30/2015  . Non-intractable vomiting with nausea   . Alcohol abuse with alcohol-induced mood disorder (HCC) 12/15/2015  . Acute alcoholism (HCC)   . Abdominal distension 11/04/2015  . Alcohol abuse   . Dyspnea   . Acute respiratory failure (HCC) 10/15/2015  . Ascites due to alcoholic cirrhosis (HCC) 10/15/2015  . Osteoporosis   . Bipolar I disorder (HCC)   . Multiple sclerosis (HCC)   . Alcoholic cirrhosis of liver with ascites (HCC) 08/22/2015  . Bipolar disorder, current episode mixed, moderate (HCC) 08/01/2015  . Alcohol use disorder, severe, dependence (HCC) 07/31/2015  . Macrocytic anemia- due to alcohol abuse with normal B12 & folate levels 05/31/2015  . Severe protein-calorie malnutrition (HCC) 05/31/2015  . Hypokalemia 05/26/2015  . Encephalopathy, hepatic (HCC) 05/26/2015  . Alcohol withdrawal (HCC) 05/18/2015  . Abdominal pain 05/18/2015  . Anxiety 05/18/2015  . Severe sepsis (  HCC) 05/18/2015  . UTI (lower urinary tract infection) 05/18/2015  . Stimulant abuse (HCC) 12/27/2014  . Nicotine abuse 12/27/2014  . Hyperprolactinemia (HCC) 11/15/2014  . Dyslipidemia   . Alcohol use disorder, moderate, dependence (HCC) 10/30/2014  . Tobacco abuse 01/23/2014  . Noncompliance with therapeutic plan 04/04/2013  . Hereditary and idiopathic peripheral neuropathy 05/01/2012  . GERD (gastroesophageal reflux disease) 05/01/2012  . Osteoarthrosis, unspecified whether  generalized or localized, involving lower leg 05/01/2012  . Hyponatremia 07/01/2011    Past Surgical History:  Procedure Laterality Date  . ABDOMINAL WALL DEFECT REPAIR N/A 12/30/2016   Procedure: EXPLORATORY LAPAROTOMY WITH REPAIR ABDOMINAL WALL VENTRAL HERNIA;  Surgeon: [name], MD;  Location: [facility] OR;  Service: General;  Laterality: N/A;  . APPLICATION OF WOUND VAC N/A 12/30/2016   Procedure: APPLICATION OF WOUND VAC;  Surgeon: [name], MD;  Location: [facility] OR;  Service: General;  Laterality: N/A;  . CESAREAN SECTION  [phone]  . FRACTURE SURGERY    . HERNIA REPAIR    . MYRINGOTOMY WITH TUBE PLACEMENT Bilateral   . ORIF WRIST FRACTURE Left 09/09/2016   Procedure: OPEN REDUCTION INTERNAL FIXATION (ORIF) LEFT WRIST FRACTURE, LEFT CARPAL TUNNEL RELEASE;  Surgeon: Dominica Severin, MD;  Location: [facility] ORS;  Service: Orthopedics;  Laterality: Left;  . TONSILLECTOMY       OB History   No obstetric history on file.      Home Medications    Prior to Admission medications   Medication Sig Start Date End Date Taking? Authorizing Provider  aluminum-magnesium hydroxide-simethicone (MAALOX) 200-200-20 MG/5ML SUSP Take 30 mLs by mouth 4 (four) times daily -  before meals and at bedtime.   Yes [provider]  benzonatate (TESSALON) 100 MG capsule Take 100 mg by mouth 2 (two) times daily as needed for cough.  12/16/17  Yes [provider]  bismuth subsalicylate (PEPTO-BISMOL) 262 MG chewable tablet Chew 2 tablets (524 mg total) by mouth as needed for diarrhea or loose stools. 07/08/17  Yes Molpus, John, MD  chlordiazePOXIDE (LIBRIUM) 25 MG capsule 50mg  PO TID x 1D, then 25-50mg  PO BID X 1D, then 25-50mg  PO QD X 1D 02/23/18  Yes Jacalyn Lefevre, MD  FLUoxetine (PROZAC) 20 MG capsule Take 20 mg by mouth daily. 12/31/17  Yes [provider]  Fluticasone-Salmeterol (ADVAIR) 250-50 MCG/DOSE AEPB Inhale 2 puffs into the lungs 2 (two) times daily. 02/23/18  Yes  Jacalyn Lefevre, MD  folic acid (FOLVITE) 1 MG tablet Take 1 tablet (1 mg total) by mouth daily. 01/20/18  Yes Leroy Sea, MD  furosemide (LASIX) 40 MG tablet Take 1 tablet (40 mg total) by mouth daily. Patient taking differently: Take 40 mg by mouth as needed for fluid.  06/17/16  Yes Midge Minium, MD  gabapentin (NEURONTIN) 100 MG capsule Take 2 capsules (200 mg total) by mouth 3 (three) times daily. 08/31/17  Yes Rolan Bucco, MD  hydrocortisone cream 1 % Apply topically 3 (three) times daily. Patient taking differently: Apply 1 application topically daily as needed for itching.  04/20/17  Yes Sheikh, Omair Latif, DO  hydrOXYzine (ATARAX/VISTARIL) 25 MG tablet Take 25-50 mg by mouth daily as needed (anxiety).    Yes [provider]  loperamide (IMODIUM A-D) 2 MG tablet Take 2 mg by mouth daily as needed for diarrhea or loose stools.    Yes [provider]  magnesium oxide (MAG-OX) 400 MG tablet Take 1 tablet (400 mg total) by mouth daily. 02/23/18  Yes Jacalyn Lefevre, MD  nicotine (NICODERM CQ - DOSED IN MG/24 HOURS) 14 mg/24hr patch Place 1 patch onto the skin daily. 12/19/17  Yes [provider]  ondansetron (ZOFRAN ODT) 4 MG disintegrating tablet Take 1 tablet (4 mg total) by mouth every 8 (eight) hours as needed. 02/23/18  Yes Jacalyn LefevreHaviland, Julie, MD  pantoprazole (PROTONIX) 40 MG tablet Take 40 mg by mouth daily.   Yes [provider]  potassium chloride SA (K-DUR,KLOR-CON) 20 MEQ tablet Take 1 tablet (20 mEq total) by mouth daily. 01/10/18  Yes Cathren LaineSteinl, Kevin, MD  promethazine (PHENERGAN) 25 MG tablet Take 1 tablet (25 mg total) by mouth every 6 (six) hours as needed for nausea or vomiting. 02/23/18  Yes Jacalyn LefevreHaviland, Julie, MD  rizatriptan (MAXALT) 5 MG tablet Take 5 mg by mouth every 2 (two) hours as needed for migraine.  12/31/17  Yes [provider]  traZODone (DESYREL) 50 MG tablet Take 50 mg by mouth at bedtime as needed for sleep.  12/31/17  Yes  [provider]  VENTOLIN HFA 108 (90 Base) MCG/ACT inhaler Inhale 2 puffs into the lungs every 4 (four) hours as needed for wheezing or shortness of breath. 02/23/18  Yes Jacalyn LefevreHaviland, Julie, MD  ipratropium-albuterol (DUONEB) 0.5-2.5 (3) MG/3ML SOLN Use twice a day scheduled and every 4 hours as needed for shortness of breath and wheezing Patient not taking: Reported on 02/28/2018 01/20/18   Leroy SeaSingh, Prashant K, MD  lactulose (CHRONULAC) 10 GM/15ML solution Take 30 mLs (20 g total) by mouth 2 (two) times daily. Patient not taking: Reported on 02/28/2018 01/20/18   Leroy SeaSingh, Prashant K, MD  potassium chloride (K-DUR) 10 MEQ tablet Take 10 mEq by mouth daily. 02/02/18   [provider]  spironolactone (ALDACTONE) 25 MG tablet Take 25 mg by mouth daily. 01/30/18   [provider]  thiamine 100 MG tablet Take 1 tablet (100 mg total) by mouth daily. Patient not taking: Reported on 02/28/2018 01/20/18   Leroy SeaSingh, Prashant K, MD    Family History Family History  Problem Relation Age of Onset  . Arrhythmia Mother   . Heart disease Father   . Hypertension Father     Social History Social History   Tobacco Use  . Smoking status: Current Every Day Smoker    Packs/day: 1.00    Years: 35.00    Pack years: 35.00    Types: Cigarettes  . Smokeless tobacco: Never Used  Substance Use Topics  . Alcohol use: Yes    Comment: 1 bottle of wine per day per patient.   . Drug use: No     Allergies   Patient has no known allergies.   Review of Systems Review of Systems  Gastrointestinal: Positive for diarrhea and vomiting.  Neurological: Positive for weakness.  All other systems reviewed and are negative.    Physical Exam Updated Vital Signs BP 110/82   Pulse 91   Temp 98.3 F (36.8 C) (Oral)   Resp 18   SpO2 96%   Physical Exam Vitals signs and nursing note reviewed.  Constitutional:      Comments: Chronically ill, vomiting   HENT:     Head: Normocephalic.     Nose:  Nose normal.     Mouth/Throat:     Comments: MM dry  Eyes:     Extraocular Movements: Extraocular movements intact.     Pupils: Pupils are equal, round, and reactive to light.  Neck:     Musculoskeletal: Normal range of motion.  Cardiovascular:  Rate and Rhythm: Regular rhythm.  Pulmonary:     Effort: Pulmonary effort is normal.     Breath sounds: Normal breath sounds.  Abdominal:     General: Abdomen is flat.     Palpations: Abdomen is soft.     Comments: + epigastric tenderness   Musculoskeletal: Normal range of motion.  Skin:    General: Skin is warm.     Capillary Refill: Capillary refill takes less than 2 seconds.  Neurological:     General: No focal deficit present.     Mental Status: She is oriented to person, place, and time.  Psychiatric:        Mood and Affect: Mood normal.        Behavior: Behavior normal.      ED Treatments / Results  Labs (all labs ordered are listed, but only abnormal results are displayed) Labs Reviewed  CBC WITH DIFFERENTIAL/PLATELET - Abnormal; Notable for the following components:      Result Value   WBC 3.1 (*)    RBC 3.70 (*)    Hemoglobin 10.0 (*)    HCT 33.4 (*)    MCHC 29.9 (*)    RDW 17.0 (*)    Platelets 92 (*)    Lymphs Abs 0.4 (*)    All other components within normal limits  COMPREHENSIVE METABOLIC PANEL - Abnormal; Notable for the following components:   Creatinine, Ser 0.34 (*)    Calcium 8.4 (*)    Total Bilirubin 1.6 (*)    All other components within normal limits  ETHANOL - Abnormal; Notable for the following components:   Alcohol, Ethyl (B) 61 (*)    All other components within normal limits  URINALYSIS, ROUTINE W REFLEX MICROSCOPIC - Abnormal; Notable for the following components:   Specific Gravity, Urine 1.045 (*)    Ketones, ur 5 (*)    Leukocytes, UA TRACE (*)    Bacteria, UA FEW (*)    All other components within normal limits  LIPASE, BLOOD  MAGNESIUM  I-STAT TROPONIN, ED    EKG EKG  Interpretation  Date/Time:  Tuesday February 28 2018 09:35:42 EST Ventricular Rate:  90 PR Interval:    QRS Duration: 96 QT Interval:  433 QTC Calculation: 530 R Axis:   80 Text Interpretation:  sinus rhythmn, poor baseline  Nonspecific T abnormalities, lateral leads Prolonged QT interval Confirmed by Richardean Canal [phone]) on 02/28/2018 9:41:56 AM   Radiology Dg Chest 2 View  Result Date: 02/28/2018 CLINICAL DATA:  Coughing and vomiting. EXAM: CHEST - 2 VIEW COMPARISON:  02/23/2018. FINDINGS: Mediastinum and hilar structures normal. Heart size normal. Mild peribronchial cuffing. No focal infiltrate. No pleural effusion or pneumothorax. No acute bony abnormality. IMPRESSION: Mild peribronchial cuffing. Mild bronchitis could not be excluded. Exam is otherwise unremarkable. Electronically Signed   By: Maisie Fus  Register   On: 02/28/2018 10:24   Ct Abdomen Pelvis W Contrast  Result Date: 02/28/2018 CLINICAL DATA:  Abdominal distension EXAM: CT ABDOMEN AND PELVIS WITH CONTRAST TECHNIQUE: Multidetector CT imaging of the abdomen and pelvis was performed using the standard protocol following bolus administration of intravenous contrast. CONTRAST:  ISOVUE-300 IOPAMIDOL (ISOVUE-300) INJECTION 61% COMPARISON:  06/12/2017 FINDINGS: Lower chest: Lung bases are free of acute infiltrate or sizable effusion. Hepatobiliary: Heterogeneity of the liver is noted consistent with underlying cirrhosis. Small hyperdense lesion is noted in the lateral aspect of the left lobe of the liver best seen on image number 17 of series 2 consistent with small  hemangioma. This is stable from the prior exam. Mild perihepatic fluid is noted. Pancreas: Unremarkable. No pancreatic ductal dilatation or surrounding inflammatory changes. Spleen: Spleen is prominent with mild perisplenic varices consistent with portal hypertension. Adrenals/Urinary Tract: Adrenal glands are unremarkable. Kidneys are normal, without renal calculi, focal  lesion, or hydronephrosis. Bladder is unremarkable. Stomach/Bowel: Mild diverticular change of the colon is noted without evidence of diverticulitis. The colon is predominately decompressed. The appendix is air-filled and within normal limits. Small bowel shows no obstructive changes. The stomach is within normal limits. Scattered esophageal varices are noted distally. Vascular/Lymphatic: Changes consistent with portal hypertension are noted as described above. Aortic calcifications are noted without aneurysmal dilatation. Scattered likely reactive lymph nodes are noted adjacent to the celiac axis as well as in the portacaval space and periaortic region. Reproductive: Uterus and bilateral adnexa are unremarkable. Other: Free fluid is noted within the pelvis consistent with ascites and the underlying portal hypertension. Musculoskeletal: Mild degenerative changes of lumbar spine are noted. IMPRESSION: Changes consistent with cirrhosis of the liver with portal hypertension and variceal changes. Mild ascites and splenomegaly are noted as well. Diverticulosis without diverticulitis. Electronically Signed   By: Alcide CleverMark  Lukens M.D.   On: 02/28/2018 11:03    Procedures Procedures (including critical care time)  Medications Ordered in ED Medications  LORazepam (ATIVAN) tablet 1 mg (has no administration in time range)    Or  LORazepam (ATIVAN) injection 1 mg (has no administration in time range)  thiamine (VITAMIN B-1) tablet 100 mg (100 mg Oral Given 02/28/18 0948)    Or  thiamine (B-1) injection 100 mg ( Intravenous See Alternative 02/28/18 0948)  folic acid (FOLVITE) tablet 1 mg (1 mg Oral Given 02/28/18 0947)  multivitamin with minerals tablet 1 tablet (1 tablet Oral Given 02/28/18 0947)  LORazepam (ATIVAN) injection 0-4 mg (2 mg Intravenous Given 02/28/18 0954)    Followed by  LORazepam (ATIVAN) injection 0-4 mg (has no administration in time range)  iopamidol (ISOVUE-300) 61 % injection (has no  administration in time range)  sodium chloride (PF) 0.9 % injection (has no administration in time range)  sodium chloride 0.9 % bolus 1,000 mL (0 mLs Intravenous Stopped 02/28/18 1148)  ondansetron (ZOFRAN) injection 4 mg (4 mg Intravenous Given 02/28/18 0947)  pantoprazole (PROTONIX) EC tablet 40 mg (40 mg Oral Given 02/28/18 0947)  gabapentin (NEURONTIN) capsule 300 mg (300 mg Oral Given 02/28/18 0953)  iopamidol (ISOVUE-300) 61 % injection 100 mL (100 mLs Intravenous Contrast Given 02/28/18 1029)  LORazepam (ATIVAN) injection 1 mg (1 mg Intravenous Given 02/28/18 1218)     Initial Impression / Assessment and Plan / ED Course  I have reviewed the triage vital signs and the nursing notes.  Pertinent labs & imaging results that were available during my care of the patient were reviewed by me and considered in my medical decision making (see chart for details).    Anne Black is a 58 y.o. female here with vomiting, diarrhea, abdominal pain. She is a chronic alcoholic. Consider alcohol gastritis vs pancreatitis. Also may have alcohol withdrawal. Will get labs, lipase, CT ab/pel. Will start on CIWA.   1:15 PM Patient not tachycardic. Felt better after ativan. Electrolytes unremarkable. CT showed some cirrhosis and mild ascites. I doubt SBP. She is requesting to stay for alcohol rehab. I told her that she has a rehab facility who accepted her in [place]. Social work able to arrange to get her home and then to facility later today. Told her  to stop drinking alcohol. She request refill of her protonix, zofran, phenergan, and gabapentin. Stable for discharge    Final Clinical Impressions(s) / ED Diagnoses   Final diagnoses:  None    ED Discharge Orders    None       Charlynne Pander, MD 02/28/18 1316

## 2018-02-28 NOTE — Progress Notes (Signed)
CSW received call from EDP as patient had a bed available today at [facility] in [place]. CSW spoke with patient at bedside who confirmed she had a bed available at [facility] and that they were going to get patient an Benedetto Goad to their facility. Patient voiced concerns with going to facility as it was unfamiliar to her and she didn't know what the facility looked like. Patient also stated she didn't like that she couldn't have her phone. CSW and EDP spoke with patient at bedside and encouraged patient to follow up with treatment options if she felt she needed them. CSW assisted patient with transportation back to her house so that she could coordinate a pick up time to go to treatment facility. No further CSW needs at this time. Please reconsult if needs arise.  Archie Balboa, LCSWA  Clinical Social Work Department  [employer]  [phone]

## 2018-02-28 NOTE — ED Triage Notes (Addendum)
Patient arrived via [facility] from home.   C/O N/V/D x1day Since last night when she stopped drinking.    Lives alone and is mentally challenged.   Patient took phenergan at home.  Patient states she is out of Gabapentin.  Hx. Alcoholism   See chart for more hx.

## 2018-02-28 NOTE — ED Notes (Signed)
Patient transported to X-ray 

## 2018-02-28 NOTE — Discharge Instructions (Signed)
Take your medicines as prescribed.   Go to alcohol rehab center as scheduled tonight.   See your doctor  Stop drinking alcohol   Return to ER if  you have worse abdominal pain, vomiting, seizure, hallucinations.

## 2018-03-04 ENCOUNTER — Telehealth: Payer: Self-pay | Admitting: Family Medicine

## 2018-03-04 NOTE — Telephone Encounter (Signed)
Pt called in wanting [place]. Checked with clinical . Pt was instructed to seek care at ER. For treatment was told pt should have received letter of discharge [facility]

## 2018-03-16 ENCOUNTER — Emergency Department (HOSPITAL_COMMUNITY): Payer: 59

## 2018-03-16 ENCOUNTER — Observation Stay (HOSPITAL_BASED_OUTPATIENT_CLINIC_OR_DEPARTMENT_OTHER)
Admission: EM | Admit: 2018-03-16 | Discharge: 2018-03-17 | Disposition: A | Discharge status: Home / Self Care | Payer: 59 | Source: Home / Self Care | Attending: Emergency Medicine | Admitting: Emergency Medicine

## 2018-03-16 ENCOUNTER — Encounter (HOSPITAL_COMMUNITY): Payer: Self-pay | Admitting: Emergency Medicine

## 2018-03-16 DIAGNOSIS — K7031 Alcoholic cirrhosis of liver with ascites: Secondary | ICD-10-CM | POA: Insufficient documentation

## 2018-03-16 DIAGNOSIS — F10129 Alcohol abuse with intoxication, unspecified: Secondary | ICD-10-CM | POA: Insufficient documentation

## 2018-03-16 DIAGNOSIS — D500 Iron deficiency anemia secondary to blood loss (chronic): Secondary | ICD-10-CM

## 2018-03-16 DIAGNOSIS — F1010 Alcohol abuse, uncomplicated: Secondary | ICD-10-CM | POA: Diagnosis not present

## 2018-03-16 DIAGNOSIS — K219 Gastro-esophageal reflux disease without esophagitis: Secondary | ICD-10-CM | POA: Insufficient documentation

## 2018-03-16 DIAGNOSIS — F319 Bipolar disorder, unspecified: Secondary | ICD-10-CM | POA: Insufficient documentation

## 2018-03-16 DIAGNOSIS — J690 Pneumonitis due to inhalation of food and vomit: Secondary | ICD-10-CM

## 2018-03-16 DIAGNOSIS — F419 Anxiety disorder, unspecified: Secondary | ICD-10-CM | POA: Insufficient documentation

## 2018-03-16 DIAGNOSIS — G9341 Metabolic encephalopathy: Secondary | ICD-10-CM | POA: Diagnosis present

## 2018-03-16 DIAGNOSIS — F10929 Alcohol use, unspecified with intoxication, unspecified: Secondary | ICD-10-CM

## 2018-03-16 DIAGNOSIS — J9621 Acute and chronic respiratory failure with hypoxia: Secondary | ICD-10-CM | POA: Diagnosis not present

## 2018-03-16 DIAGNOSIS — R600 Localized edema: Secondary | ICD-10-CM | POA: Diagnosis present

## 2018-03-16 DIAGNOSIS — F329 Major depressive disorder, single episode, unspecified: Secondary | ICD-10-CM | POA: Diagnosis present

## 2018-03-16 DIAGNOSIS — Z79899 Other long term (current) drug therapy: Secondary | ICD-10-CM | POA: Insufficient documentation

## 2018-03-16 DIAGNOSIS — F17210 Nicotine dependence, cigarettes, uncomplicated: Secondary | ICD-10-CM

## 2018-03-16 DIAGNOSIS — J441 Chronic obstructive pulmonary disease with (acute) exacerbation: Secondary | ICD-10-CM

## 2018-03-16 DIAGNOSIS — G43909 Migraine, unspecified, not intractable, without status migrainosus: Secondary | ICD-10-CM | POA: Diagnosis present

## 2018-03-16 DIAGNOSIS — D649 Anemia, unspecified: Secondary | ICD-10-CM | POA: Diagnosis present

## 2018-03-16 DIAGNOSIS — R2243 Localized swelling, mass and lump, lower limb, bilateral: Secondary | ICD-10-CM | POA: Insufficient documentation

## 2018-03-16 DIAGNOSIS — J9601 Acute respiratory failure with hypoxia: Secondary | ICD-10-CM

## 2018-03-16 DIAGNOSIS — Z720 Tobacco use: Secondary | ICD-10-CM | POA: Diagnosis present

## 2018-03-16 DIAGNOSIS — F32A Depression, unspecified: Secondary | ICD-10-CM | POA: Diagnosis present

## 2018-03-16 LAB — HEPATIC FUNCTION PANEL
ALT: 20 U/L (ref 0–44)
AST: 39 U/L (ref 15–41)
Albumin: 3.4 g/dL — ABNORMAL LOW (ref 3.5–5.0)
Alkaline Phosphatase: 97 U/L (ref 38–126)
BILIRUBIN TOTAL: 1 mg/dL (ref 0.3–1.2)
Bilirubin, Direct: 0.3 mg/dL — ABNORMAL HIGH (ref 0.0–0.2)
Indirect Bilirubin: 0.7 mg/dL (ref 0.3–0.9)
Total Protein: 6.6 g/dL (ref 6.5–8.1)

## 2018-03-16 LAB — ETHANOL: Alcohol, Ethyl (B): 166 mg/dL — ABNORMAL HIGH (ref <10)

## 2018-03-16 LAB — CBC WITH DIFFERENTIAL/PLATELET
Abs Immature Granulocytes: 0.01 10*3/uL (ref 0.00–0.07)
Basophils Absolute: 0.1 10*3/uL (ref 0.0–0.1)
Basophils Relative: 1 %
Eosinophils Absolute: 0 10*3/uL (ref 0.0–0.5)
Eosinophils Relative: 1 %
HCT: 32.9 % — ABNORMAL LOW (ref 36.0–46.0)
Hemoglobin: 9.5 g/dL — ABNORMAL LOW (ref 12.0–15.0)
IMMATURE GRANULOCYTES: 0 %
Lymphocytes Relative: 17 %
Lymphs Abs: 0.7 10*3/uL (ref 0.7–4.0)
MCH: 25.5 pg — AB (ref 26.0–34.0)
MCHC: 28.9 g/dL — ABNORMAL LOW (ref 30.0–36.0)
MCV: 88.4 fL (ref 80.0–100.0)
MONOS PCT: 9 %
Monocytes Absolute: 0.4 10*3/uL (ref 0.1–1.0)
Neutro Abs: 3 10*3/uL (ref 1.7–7.7)
Neutrophils Relative %: 72 %
Platelets: 130 10*3/uL — ABNORMAL LOW (ref 150–400)
RBC: 3.72 MIL/uL — ABNORMAL LOW (ref 3.87–5.11)
RDW: 16.1 % — ABNORMAL HIGH (ref 11.5–15.5)
WBC: 4.2 10*3/uL (ref 4.0–10.5)
nRBC: 0 % (ref 0.0–0.2)

## 2018-03-16 LAB — BASIC METABOLIC PANEL
Anion gap: 11 (ref 5–15)
BUN: 6 mg/dL (ref 6–20)
CO2: 28 mmol/L (ref 22–32)
Calcium: 8.1 mg/dL — ABNORMAL LOW (ref 8.9–10.3)
Chloride: 100 mmol/L (ref 98–111)
Creatinine, Ser: 0.47 mg/dL (ref 0.44–1.00)
GFR calc Af Amer: >60 mL/min (ref >60)
GFR calc non Af Amer: >60 mL/min (ref >60)
Glucose, Bld: 108 mg/dL — ABNORMAL HIGH (ref 70–99)
POTASSIUM: 3.9 mmol/L (ref 3.5–5.1)
Sodium: 139 mmol/L (ref 135–145)

## 2018-03-16 LAB — AMMONIA: Ammonia: 40 umol/L — ABNORMAL HIGH (ref 9–35)

## 2018-03-16 LAB — LIPASE, BLOOD: Lipase: 20 U/L (ref 11–51)

## 2018-03-16 MED ORDER — VENTOLIN HFA 108 (90 BASE) MCG/ACT IN AERS: 2.0000 | INHALATION_SPRAY | RESPIRATORY_TRACT | 0 refills | Status: DC | PRN

## 2018-03-16 MED ORDER — PANTOPRAZOLE SODIUM 40 MG PO TBEC: 40.0000 mg | DELAYED_RELEASE_TABLET | Freq: Every day | ORAL | 0 refills | Status: DC

## 2018-03-16 MED ORDER — PREDNISONE 20 MG PO TABS
60.0000 mg | ORAL_TABLET | Freq: Once | ORAL | Status: AC
  Administered 2018-03-17: 60 mg via ORAL
  Filled 2018-03-16: qty 3

## 2018-03-16 MED ORDER — LACTULOSE 10 GM/15ML PO SOLN: 20.0000 g | Freq: Two times a day (BID) | ORAL | 0 refills | Status: DC

## 2018-03-16 MED ORDER — IPRATROPIUM-ALBUTEROL 0.5-2.5 (3) MG/3ML IN SOLN
3.0000 mL | Freq: Once | RESPIRATORY_TRACT | Status: AC
  Administered 2018-03-16: 3 mL via RESPIRATORY_TRACT
  Filled 2018-03-16: qty 3

## 2018-03-16 MED ORDER — SODIUM CHLORIDE 0.9 % IV BOLUS
500.0000 mL | Freq: Once | INTRAVENOUS | Status: AC
  Administered 2018-03-16: 500 mL via INTRAVENOUS

## 2018-03-16 MED ORDER — LORAZEPAM 2 MG/ML IJ SOLN
0.0000 mg | Freq: Four times a day (QID) | INTRAMUSCULAR | Status: DC
  Administered 2018-03-17: 2 mg via INTRAVENOUS
  Filled 2018-03-16 (×2): qty 2

## 2018-03-16 MED ORDER — ONDANSETRON HCL 4 MG/2ML IJ SOLN
INTRAMUSCULAR | Status: AC
  Administered 2018-03-17: 4 mg
  Filled 2018-03-16: qty 2

## 2018-03-16 MED ORDER — THIAMINE HCL 100 MG/ML IJ SOLN: 100.0000 mg | Freq: Every day | INTRAMUSCULAR | Status: DC

## 2018-03-16 MED ORDER — LORAZEPAM 2 MG/ML IJ SOLN: 0.0000 mg | Freq: Two times a day (BID) | INTRAMUSCULAR | Status: DC

## 2018-03-16 MED ORDER — SPIRONOLACTONE 25 MG PO TABS: 25.0000 mg | ORAL_TABLET | Freq: Every day | ORAL | 0 refills | Status: DC

## 2018-03-16 MED ORDER — SODIUM CHLORIDE 0.9 % IV SOLN
3.0000 g | Freq: Once | INTRAVENOUS | Status: DC
  Filled 2018-03-16: qty 3

## 2018-03-16 MED ORDER — LORAZEPAM 1 MG PO TABS: 0.0000 mg | ORAL_TABLET | Freq: Two times a day (BID) | ORAL | Status: DC

## 2018-03-16 MED ORDER — AZITHROMYCIN 250 MG PO TABS: 250.0000 mg | ORAL_TABLET | Freq: Every day | ORAL | 0 refills | Status: DC

## 2018-03-16 MED ORDER — LORAZEPAM 1 MG PO TABS
0.0000 mg | ORAL_TABLET | Freq: Four times a day (QID) | ORAL | Status: DC
  Administered 2018-03-17: 2 mg via ORAL
  Administered 2018-03-17 (×2): 1 mg via ORAL
  Filled 2018-03-16: qty 2
  Filled 2018-03-16: qty 1
  Filled 2018-03-16: qty 2

## 2018-03-16 MED ORDER — LORAZEPAM 1 MG PO TABS
1.0000 mg | ORAL_TABLET | Freq: Once | ORAL | Status: AC
  Administered 2018-03-17: 1 mg via ORAL
  Filled 2018-03-16: qty 1

## 2018-03-16 MED ORDER — PREDNISONE 20 MG PO TABS: 60.0000 mg | ORAL_TABLET | Freq: Every day | ORAL | 0 refills | Status: AC

## 2018-03-16 MED ORDER — VITAMIN B-1 100 MG PO TABS
100.0000 mg | ORAL_TABLET | Freq: Every day | ORAL | Status: DC
  Administered 2018-03-17: 100 mg via ORAL
  Filled 2018-03-16: qty 1

## 2018-03-16 MED ORDER — PANTOPRAZOLE SODIUM 40 MG IV SOLR
40.0000 mg | Freq: Once | INTRAVENOUS | Status: AC
  Administered 2018-03-17: 40 mg via INTRAVENOUS
  Filled 2018-03-16: qty 40

## 2018-03-16 NOTE — ED Notes (Signed)
Patient now awake, attempting to climb out of bed. States she is in withdrawals and requesting adderall. Md advised and is at bedside speaking with patient to determine if she wants to be inpatient versus d/c'd.

## 2018-03-16 NOTE — H&P (Addendum)
History and Physical    Anne BODDY Black:[id number] DOB: Sep 24, 1960 DOA: 03/16/2018  Referring MD/NP/PA:   PCP: System, Pcp Not In   Patient coming from:  The patient is coming from home.  At baseline, pt is independent for most of ADL.        Chief Complaint: AMS and SOB  HPI: Anne Black is a 58 y.o. female with medical history significant of COPD, alcohol abuse, bipolar, ADHD, depression, alcoholic cirrhosis with ascites, migraine headache, questionable multiple sclerosis, who presents with altered mental status and shortness of breath.  Per report, pt was found to be confused today and her friend called stating patient has had a large amount of alcohol. Her mental status has gradually improved in ED.  When I saw patient in ED, she is alert, orientated x3.  She complains of shortness of breath and dry cough, denies chest pain.  No runny nose or sore throat.  Patient states that she has chronic diffuse abdominal pain, which has not changed today.  I repeatedly asked her if she has worse abdominal pain, she states that the abdominal pain is at her normal level today.  She also has abdominal distention.  She states that she has nausea, but no vomiting.  She also has some mild diarrhea yesterday.  Patient denies symptoms of UTI or unilateral weakness.  She states that she is supposed to use 2 L oxygen at home, but she is not using it.  She is not sure what medication she is taking exactly.  Patient has a bilateral lower leg edema (R leg is worse than the left leg).  ED Course: pt was found to have ammonia level 40, alcohol level 166, electrolytes renal function okay, temperature normal, heart rate in 90s, oxygen saturation 89% on room air, chest x-ray showed cardiomegaly with vascular congestion, CT head is negative for acute intracranial abnormalities.  Review of Systems:   General: no fevers, chills, no body weight gain, has fatigue HEENT: no blurry vision, hearing changes or sore  throat Respiratory: Has dyspnea, coughing, wheezing CV: no chest pain, no palpitations GI: Has nausea, abdominal pain, diarrhea, no constipation vomiting, GU: no dysuria, burning on urination, increased urinary frequency, hematuria  Ext: Has leg edema Neuro: no unilateral weakness, numbness, or tingling, no vision change or hearing loss. Had AMS Skin: no rash, no skin tear. MSK: No muscle spasm, no deformity, no limitation of range of movement in spin Heme: No easy bruising.  Travel history: No recent long distant travel.  Allergy: No Known Allergies  Past Medical History:  Diagnosis Date  . ADHD (attention deficit hyperactivity disorder)   . Alcoholism (HCC)   . Anxiety   . Arthritis   . Ascites   . Bipolar disorder (HCC)   . Cirrhosis (HCC)   . COPD (chronic obstructive pulmonary disease) (HCC)   . Hypokalemia   . Migraine   . Multiple sclerosis (HCC)   . Narcolepsy   . Osteoporosis     Past Surgical History:  Procedure Laterality Date  . ABDOMINAL WALL DEFECT REPAIR N/A 12/30/2016   Procedure: EXPLORATORY LAPAROTOMY WITH REPAIR ABDOMINAL WALL VENTRAL HERNIA;  Surgeon: [name], MD;  Location: [facility] OR;  Service: General;  Laterality: N/A;  . APPLICATION OF WOUND VAC N/A 12/30/2016   Procedure: APPLICATION OF WOUND VAC;  Surgeon: [name], MD;  Location: [facility] OR;  Service: General;  Laterality: N/A;  . CESAREAN SECTION  [phone]  . FRACTURE SURGERY    . HERNIA  REPAIR    . MYRINGOTOMY WITH TUBE PLACEMENT Bilateral   . ORIF WRIST FRACTURE Left 09/09/2016   Procedure: OPEN REDUCTION INTERNAL FIXATION (ORIF) LEFT WRIST FRACTURE, LEFT CARPAL TUNNEL RELEASE;  Surgeon: Dominica Severin, MD;  Location: [facility] ORS;  Service: Orthopedics;  Laterality: Left;  . TONSILLECTOMY      Social History:  reports that she has been smoking cigarettes. She has a 35.00 pack-year smoking history. She has never used smokeless tobacco. She reports current alcohol use. She reports that  she does not use drugs.  Family History:  Family History  Problem Relation Age of Onset  . Arrhythmia Mother   . Heart disease Father   . Hypertension Father      Prior to Admission medications   Medication Sig Start Date End Date Taking? Authorizing Provider  aluminum-magnesium hydroxide-simethicone (MAALOX) 200-200-20 MG/5ML SUSP Take 30 mLs by mouth 4 (four) times daily -  before meals and at bedtime.    [provider]  azithromycin (ZITHROMAX) 250 MG tablet Take 1 tablet (250 mg total) by mouth daily. Take first 2 tablets together, then 1 every day until finished. 03/16/18   Curatolo, Adam, DO  benzonatate (TESSALON) 100 MG capsule Take 100 mg by mouth 2 (two) times daily as needed for cough.  12/16/17   [provider]  bismuth subsalicylate (PEPTO-BISMOL) 262 MG chewable tablet Chew 2 tablets (524 mg total) by mouth as needed for diarrhea or loose stools. 07/08/17   Molpus, John, MD  chlordiazePOXIDE (LIBRIUM) 25 MG capsule 50mg  PO TID x 1D, then 25-50mg  PO BID X 1D, then 25-50mg  PO QD X 1D 02/23/18   Jacalyn Lefevre, MD  FLUoxetine (PROZAC) 20 MG capsule Take 20 mg by mouth daily. 12/31/17   [provider]  Fluticasone-Salmeterol (ADVAIR) 250-50 MCG/DOSE AEPB Inhale 2 puffs into the lungs 2 (two) times daily. 02/28/18   Charlynne Pander, MD  folic acid (FOLVITE) 1 MG tablet Take 1 tablet (1 mg total) by mouth daily. 01/20/18   Leroy Sea, MD  furosemide (LASIX) 40 MG tablet Take 1 tablet (40 mg total) by mouth daily. Patient taking differently: Take 40 mg by mouth as needed for fluid.  06/17/16   Midge Minium, MD  gabapentin (NEURONTIN) 100 MG capsule Take 2 capsules (200 mg total) by mouth 3 (three) times daily. 02/28/18   Charlynne Pander, MD  hydrocortisone cream 1 % Apply topically 3 (three) times daily. Patient taking differently: Apply 1 application topically daily as needed for itching.  04/20/17   Marguerita Merles Latif, DO  hydrOXYzine  (ATARAX/VISTARIL) 25 MG tablet Take 25-50 mg by mouth daily as needed (anxiety).     [provider]  ipratropium-albuterol (DUONEB) 0.5-2.5 (3) MG/3ML SOLN Use twice a day scheduled and every 4 hours as needed for shortness of breath and wheezing Patient not taking: Reported on 02/28/2018 01/20/18   Leroy Sea, MD  lactulose (CHRONULAC) 10 GM/15ML solution Take 30 mLs (20 g total) by mouth 2 (two) times daily. 03/16/18   Curatolo, Adam, DO  loperamide (IMODIUM A-D) 2 MG tablet Take 2 mg by mouth daily as needed for diarrhea or loose stools.     [provider]  magnesium oxide (MAG-OX) 400 MG tablet Take 1 tablet (400 mg total) by mouth daily. 02/23/18   Jacalyn Lefevre, MD  nicotine (NICODERM CQ - DOSED IN MG/24 HOURS) 14 mg/24hr patch Place 1 patch onto the skin daily. 12/19/17   [provider]  ondansetron [facility]  ODT) 4 MG disintegrating tablet Take 1 tablet (4 mg total) by mouth every 8 (eight) hours as needed. 02/28/18   Charlynne Pander, MD  pantoprazole (PROTONIX) 40 MG tablet Take 1 tablet (40 mg total) by mouth daily. 03/16/18   Curatolo, Adam, DO  potassium chloride (K-DUR) 10 MEQ tablet Take 10 mEq by mouth daily. 02/02/18   [provider]  potassium chloride SA (K-DUR,KLOR-CON) 20 MEQ tablet Take 1 tablet (20 mEq total) by mouth daily. 01/10/18   Cathren Laine, MD  predniSONE (DELTASONE) 20 MG tablet Take 3 tablets (60 mg total) by mouth daily with breakfast for 4 days. 03/16/18 03/20/18  Virgina Norfolk, DO  promethazine (PHENERGAN) 25 MG tablet Take 1 tablet (25 mg total) by mouth every 6 (six) hours as needed for nausea or vomiting. 02/28/18   Charlynne Pander, MD  rizatriptan (MAXALT) 5 MG tablet Take 5 mg by mouth every 2 (two) hours as needed for migraine.  12/31/17   [provider]  spironolactone (ALDACTONE) 25 MG tablet Take 1 tablet (25 mg total) by mouth daily for 30 days. 03/16/18 04/15/18  Curatolo, Adam, DO  thiamine 100 MG tablet  Take 1 tablet (100 mg total) by mouth daily. 01/20/18   Leroy Sea, MD  traZODone (DESYREL) 50 MG tablet Take 50 mg by mouth at bedtime as needed for sleep.  12/31/17   [provider]  VENTOLIN HFA 108 (90 Base) MCG/ACT inhaler Inhale 2 puffs into the lungs every 4 (four) hours as needed for wheezing or shortness of breath. 03/16/18   Virgina Norfolk, DO    Physical Exam: Vitals:   03/16/18 2215 03/16/18 2351 03/17/18 0030 03/17/18 0140  BP: 135/65 130/78 137/87 (!) 124/95  Pulse: 93 89 90 93  Resp: 13  17 20   Temp:    98.2 F (36.8 C)  TempSrc:    Oral  SpO2: 96%  97% 93%   General: Not in acute distress HEENT:       Eyes: PERRL, EOMI, no scleral icterus.       ENT: No discharge from the ears and nose, no pharynx injection, no tonsillar enlargement.        Neck: No JVD, no bruit, no mass felt. Heme: No neck lymph node enlargement. Cardiac: S1/S2, RRR, No murmurs, No gallops or rubs. Respiratory: Has mild wheezing bilaterally GI: distended with diffused tenderness, no rebound pain, no organomegaly, BS present. GU: No hematuria Ext: 2+DP/PT pulse bilaterally.  Has asymmetric lower leg edema (right leg is worse than the left) Musculoskeletal: No joint deformities, No joint redness or warmth, no limitation of ROM in spin. Skin: No rashes.  Neuro: currently pt is alert, oriented X3, cranial nerves II-XII grossly intact, moves all extremities normally.  Psych: Patient is not psychotic, no suicidal or hemocidal ideation.  Labs on Admission: I have personally reviewed following labs and imaging studies  CBC: Recent Labs  Lab 03/16/18 1805  WBC 4.2  NEUTROABS 3.0  HGB 9.5*  HCT 32.9*  MCV 88.4  PLT 130*   Basic Metabolic Panel: Recent Labs  Lab 03/16/18 1805  NA 139  K 3.9  CL 100  CO2 28  GLUCOSE 108*  BUN 6  CREATININE 0.47  CALCIUM 8.1*   GFR: CrCl cannot be calculated (Unknown ideal weight.). Liver Function Tests: Recent Labs  Lab 03/16/18 1941   AST 39  ALT 20  ALKPHOS 97  BILITOT 1.0  PROT 6.6  ALBUMIN 3.4*   Recent Labs  Lab 03/16/18 1941  LIPASE 20   Recent Labs  Lab 03/16/18 1941  AMMONIA 40*   Coagulation Profile: No results for input(s): INR, PROTIME in the last 168 hours. Cardiac Enzymes: No results for input(s): CKTOTAL, CKMB, CKMBINDEX, TROPONINI in the last 168 hours. BNP (last 3 results) No results for input(s): PROBNP in the last 8760 hours. HbA1C: No results for input(s): HGBA1C in the last 72 hours. CBG: No results for input(s): GLUCAP in the last 168 hours. Lipid Profile: No results for input(s): CHOL, HDL, LDLCALC, TRIG, CHOLHDL, LDLDIRECT in the last 72 hours. Thyroid Function Tests: No results for input(s): TSH, T4TOTAL, FREET4, T3FREE, THYROIDAB in the last 72 hours. Anemia Panel: No results for input(s): VITAMINB12, FOLATE, FERRITIN, TIBC, IRON, RETICCTPCT in the last 72 hours. Urine analysis:    Component Value Date/Time   COLORURINE YELLOW 03/17/2018 0059   APPEARANCEUR CLEAR 03/17/2018 0059   LABSPEC 1.009 03/17/2018 0059   PHURINE 6.0 03/17/2018 0059   GLUCOSEU NEGATIVE 03/17/2018 0059   HGBUR NEGATIVE 03/17/2018 0059   BILIRUBINUR NEGATIVE 03/17/2018 0059   BILIRUBINUR moderate (A) 06/19/2015 1906   KETONESUR NEGATIVE 03/17/2018 0059   PROTEINUR NEGATIVE 03/17/2018 0059   UROBILINOGEN >=8.0 06/19/2015 1906   UROBILINOGEN 0.2 09/14/2011 2141   NITRITE NEGATIVE 03/17/2018 0059   LEUKOCYTESUR TRACE (A) 03/17/2018 0059   Sepsis Labs: @LABRCNTIP (procalcitonin:4,lacticidven:4) )No results found for this or any previous visit (from the past 240 hour(s)).   Radiological Exams on Admission: Ct Head Wo Contrast  Result Date: 03/16/2018 CLINICAL DATA:  Altered level of consciousness. History of alcohol ingestion. Responsive to painful stimuli. EXAM: CT HEAD WITHOUT CONTRAST TECHNIQUE: Contiguous axial images were obtained from the base of the skull through the vertex without  intravenous contrast. COMPARISON:  03/19/2017 FINDINGS: Motion artifact limits examination. Brain: Mild diffuse cerebral atrophy. Mild ventricular dilatation consistent with central atrophy. Patchy low-attenuation changes in the deep white matter likely due to small vessel ischemia. No mass-effect or midline shift. No abnormal extra-axial fluid collections. Gray-white matter junctions are distinct. Basal cisterns are not effaced. No acute intracranial hemorrhage. Vascular: No hyperdense vessel or unexpected calcification. Skull: Calvarium appears intact. No acute depressed skull fractures identified. Sinuses/Orbits: Visualized paranasal sinuses and mastoid air cells are clear. Visualization is severely limited due to motion artifact. Other: None. IMPRESSION: No acute intracranial abnormalities. Chronic atrophy and small vessel ischemic changes. Electronically Signed   By: Burman Nieves M.D.   On: 03/16/2018 21:26   Dg Chest Portable 1 View  Result Date: 03/16/2018 CLINICAL DATA:  Poorly responsive after alcohol intake. EXAM: PORTABLE CHEST 1 VIEW COMPARISON:  Chest radiograph February 28, 2018 FINDINGS: Stable cardiomegaly. Interstitial prominence with pulmonary vascular congestion. No pleural effusion or focal consolidation. No pneumothorax. Old LEFT rib fractures. Breast implants. IMPRESSION: Stable cardiomegaly and pulmonary vascular congestion. Interstitial prominence seen with atypical infection and/or pulmonary edema. Electronically Signed   By: Awilda Metro M.D.   On: 03/16/2018 18:25     EKG:  Not done in ED, will get one.   Assessment/Plan Principal Problem:   Acute metabolic encephalopathy Active Problems:   Depression   GERD (gastroesophageal reflux disease)   Bipolar 1 disorder (HCC)   Tobacco abuse   Nicotine abuse   Anxiety   Alcoholic cirrhosis of liver with ascites (HCC)   Alcohol abuse   COPD exacerbation (HCC)   Normocytic anemia   Lower leg edema   Migraine   Acute  on chronic respiratory failure with hypoxia (HCC)   Alcoholic  intoxication (HCC)   Acute metabolic encephalopathy: Patient's altered mental status is most likely due to alcohol intoxication.  Alcohol level 166.  Her mental status has already improved in ED.  Currently oriented x3.  CT head is negative for acute intracranial abnormalities.  No focal neurologic findings on physical examination.   -will place on tele bed for obs -Frequent neuro check -CIWA protocol started.  Acute on chronic respiratory failure with hypoxia due to COPD exacerbation: Chest x-ray showed cardiomegaly and vascular congestion, but no infiltration. -DuoNeb nebulizers, Dulera inhalers, PRN albuterol nebulizers, - Solu-Medrol 60 mg twice daily - Mucinex as needed for cough  GERD (gastroesophageal reflux disease): -Protonix  Tobacco abuse and Alcohol abuse: -Did counseling about importance of quitting smoking -Nicotine patch -Did counseling about the importance of quitting drinking -CIWA protocol and librium 10 mg tid  Alcoholic cirrhosis of liver with ascites [facility](HCC): Patient states that she has chronic abdominal distention and abdominal pain, which is at her normal baseline.  No fever or leukocytosis.  I have low suspicions for SBP.  Ammonium is a slightly elevated 40.  Patient had altered mental status, but mental status has already improved in the ED.  Currently patient is oriented x3.  Less likely to have a hepatic encephalopathy. -lactulose 20 g twice daily -Continue Lasix and spironolactone  Bipolar, depression, anxiety: -Continue home Prozac, PRN hydroxyzine  Normocytic anemia: hgb 10.0 on 02/28/18--.9.5 -f/u by CBC  Lower leg edema: Likely due to liver cirrhosis, but her leg edema is asymmetrical, will need to rule out DVT. -LE venous Doppler -check BNP  Migraine -prn rizatriptan    DVT ppx: SQ heparin Code Status: Full code Family Communication: Yes, patient's caregiver   at bed  side Disposition Plan:  Anticipate discharge back to previous home environment Consults called:  none Admission status: Obs / tele     Date of Service 03/17/2018    Lorretta HarpXilin Alonie Gazzola [facility]   If 7PM-7AM, please contact night-coverage [email or web address] Password [email or web address] 03/17/2018, 2:15 AM

## 2018-03-16 NOTE — ED Provider Notes (Addendum)
[facility] EMERGENCY DEPARTMENT Provider Note   CSN: [id number] Arrival date & time: 03/16/18  1743     History   Chief Complaint Chief Complaint  Patient presents with  . Altered Mental Status    HPI Anne Black is a 59 y.o. female.  The history is provided by the patient and the EMS personnel.  Altered Mental Status  Presenting symptoms: partial responsiveness   Severity:  Moderate Most recent episode:  Today Episode history:  Single Timing:  Constant Progression:  Unchanged Chronicity:  Recurrent Context: alcohol use (large amounts of ETOH today per EMS per friend that was with her today. )   Associated symptoms: slurred speech   Associated symptoms: no difficulty breathing and no eye deviation     Past Medical History:  Diagnosis Date  . ADHD (attention deficit hyperactivity disorder)   . Alcoholism (HCC)   . Anxiety   . Arthritis   . Ascites   . Bipolar disorder (HCC)   . Cirrhosis (HCC)   . COPD (chronic obstructive pulmonary disease) (HCC)   . Hypokalemia   . Migraine   . Multiple sclerosis (HCC)   . Narcolepsy   . Osteoporosis     Patient Active Problem List   Diagnosis Date Noted  . Acute on chronic respiratory failure with hypoxia (HCC) 03/17/2018  . Alcoholic intoxication (HCC) 03/17/2018  . ETOH abuse   . Normocytic anemia 03/16/2018  . Acute metabolic encephalopathy 03/16/2018  . Lower leg edema 03/16/2018  . Migraine 03/16/2018  . A-fib (HCC) 02/18/2018  . Community acquired pneumonia of left lower lobe of lung (HCC)   . Weakness 08/21/2017  . COPD exacerbation (HCC) 06/12/2017  . Chest pain with moderate risk for cardiac etiology 05/30/2017  . Abnormal LFTs 05/30/2017  . Tachycardia 05/30/2017  . COPD (chronic obstructive pulmonary disease) (HCC) 04/19/2017  . Right rib fracture 04/15/2017  . Abdominal wall cellulitis 12/30/2016  . S/P exploratory laparotomy 12/30/2016  . Distal radius fracture, left 09/09/2016    . Thrombocytopenia (HCC) 09/09/2016  . Chest x-ray abnormality   . Umbilical hernia without obstruction and without gangrene 05/12/2016  . Itching 02/24/2016  . Ventral hernia without obstruction or gangrene 02/24/2016  . Palliative care encounter   . Ascites   . Tachypnea   . Atrial flutter with rapid ventricular response (HCC)   . Hypomagnesemia   . Hypoxia   . Dehydration 01/22/2016  . Low back ache 12/30/2015  . Non-intractable vomiting with nausea   . Alcohol abuse with alcohol-induced mood disorder (HCC) 12/15/2015  . Acute alcoholism (HCC)   . Abdominal distension 11/04/2015  . Alcohol abuse   . Dyspnea   . Acute respiratory failure (HCC) 10/15/2015  . Ascites due to alcoholic cirrhosis (HCC) 10/15/2015  . Osteoporosis   . Bipolar I disorder (HCC)   . Multiple sclerosis (HCC)   . Alcoholic cirrhosis of liver with ascites (HCC) 08/22/2015  . Bipolar disorder, current episode mixed, moderate (HCC) 08/01/2015  . Alcohol use disorder, severe, dependence (HCC) 07/31/2015  . Macrocytic anemia- due to alcohol abuse with normal B12 & folate levels 05/31/2015  . Severe protein-calorie malnutrition (HCC) 05/31/2015  . Hypokalemia 05/26/2015  . Encephalopathy, hepatic (HCC) 05/26/2015  . Alcohol withdrawal (HCC) 05/18/2015  . Abdominal pain 05/18/2015  . Anxiety 05/18/2015  . Severe sepsis (HCC) 05/18/2015  . UTI (lower urinary tract infection) 05/18/2015  . Stimulant abuse (HCC) 12/27/2014  . Nicotine abuse 12/27/2014  . Hyperprolactinemia (HCC) 11/15/2014  .  Dyslipidemia   . Alcohol use disorder, moderate, dependence (HCC) 10/30/2014  . Tobacco abuse 01/23/2014  . Noncompliance with therapeutic plan 04/04/2013  . Hereditary and idiopathic peripheral neuropathy 05/01/2012  . GERD (gastroesophageal reflux disease) 05/01/2012  . Osteoarthrosis, unspecified whether generalized or localized, involving lower leg 05/01/2012  . Hyponatremia 07/01/2011    Past Surgical  History:  Procedure Laterality Date  . ABDOMINAL WALL DEFECT REPAIR N/A 12/30/2016   Procedure: EXPLORATORY LAPAROTOMY WITH REPAIR ABDOMINAL WALL VENTRAL HERNIA;  Surgeon: [name], MD;  Location: [facility] OR;  Service: General;  Laterality: N/A;  . APPLICATION OF WOUND VAC N/A 12/30/2016   Procedure: APPLICATION OF WOUND VAC;  Surgeon: [name], MD;  Location: [facility] OR;  Service: General;  Laterality: N/A;  . CESAREAN SECTION  [phone]  . FRACTURE SURGERY    . HERNIA REPAIR    . MYRINGOTOMY WITH TUBE PLACEMENT Bilateral   . ORIF WRIST FRACTURE Left 09/09/2016   Procedure: OPEN REDUCTION INTERNAL FIXATION (ORIF) LEFT WRIST FRACTURE, LEFT CARPAL TUNNEL RELEASE;  Surgeon: Dominica Severin, MD;  Location: [facility] ORS;  Service: Orthopedics;  Laterality: Left;  . TONSILLECTOMY       OB History   No obstetric history on file.      Home Medications    Prior to Admission medications   Medication Sig Start Date End Date Taking? Authorizing Provider  aluminum-magnesium hydroxide-simethicone (MAALOX) 200-200-20 MG/5ML SUSP Take 30 mLs by mouth 4 (four) times daily -  before meals and at bedtime.    [provider]  azithromycin (ZITHROMAX) 250 MG tablet Take 1 tablet (250 mg total) by mouth daily. Take first 2 tablets together, then 1 every day until finished. 03/16/18   Lennin Osmond, DO  benzonatate (TESSALON) 100 MG capsule Take 100 mg by mouth 2 (two) times daily as needed for cough.  12/16/17   [provider]  bismuth subsalicylate (PEPTO-BISMOL) 262 MG chewable tablet Chew 2 tablets (524 mg total) by mouth as needed for diarrhea or loose stools. 07/08/17   Molpus, John, MD  chlordiazePOXIDE (LIBRIUM) 25 MG capsule 50mg  PO TID x 1D, then 25-50mg  PO BID X 1D, then 25-50mg  PO QD X 1D 02/23/18   Jacalyn Lefevre, MD  FLUoxetine (PROZAC) 20 MG capsule Take 20 mg by mouth daily. 12/31/17   [provider]  Fluticasone-Salmeterol (ADVAIR) 250-50 MCG/DOSE AEPB Inhale 2  puffs into the lungs 2 (two) times daily. 02/28/18   Charlynne Pander, MD  folic acid (FOLVITE) 1 MG tablet Take 1 tablet (1 mg total) by mouth daily. 01/20/18   Leroy Sea, MD  furosemide (LASIX) 40 MG tablet Take 1 tablet (40 mg total) by mouth daily. Patient taking differently: Take 40 mg by mouth as needed for fluid.  06/17/16   Midge Minium, MD  gabapentin (NEURONTIN) 100 MG capsule Take 2 capsules (200 mg total) by mouth 3 (three) times daily. 02/28/18   Charlynne Pander, MD  hydrocortisone cream 1 % Apply topically 3 (three) times daily. Patient taking differently: Apply 1 application topically daily as needed for itching.  04/20/17   Marguerita Merles Latif, DO  hydrOXYzine (ATARAX/VISTARIL) 25 MG tablet Take 25-50 mg by mouth daily as needed (anxiety).     [provider]  ipratropium-albuterol (DUONEB) 0.5-2.5 (3) MG/3ML SOLN Use twice a day scheduled and every 4 hours as needed for shortness of breath and wheezing Patient not taking: Reported on 02/28/2018 01/20/18   Leroy Sea, MD  lactulose (CHRONULAC) 10 GM/15ML solution  Take 30 mLs (20 g total) by mouth 2 (two) times daily. 03/16/18   Jaxton Casale, DO  loperamide (IMODIUM A-D) 2 MG tablet Take 2 mg by mouth daily as needed for diarrhea or loose stools.     [provider]  magnesium oxide (MAG-OX) 400 MG tablet Take 1 tablet (400 mg total) by mouth daily. 02/23/18   Jacalyn Lefevre, MD  nicotine (NICODERM CQ - DOSED IN MG/24 HOURS) 14 mg/24hr patch Place 1 patch onto the skin daily. 12/19/17   [provider]  ondansetron (ZOFRAN ODT) 4 MG disintegrating tablet Take 1 tablet (4 mg total) by mouth every 8 (eight) hours as needed. 02/28/18   Charlynne Pander, MD  pantoprazole (PROTONIX) 40 MG tablet Take 1 tablet (40 mg total) by mouth daily. 03/16/18   Delilah Mulgrew, DO  potassium chloride (K-DUR) 10 MEQ tablet Take 10 mEq by mouth daily. 02/02/18   [provider]  potassium chloride SA  (K-DUR,KLOR-CON) 20 MEQ tablet Take 1 tablet (20 mEq total) by mouth daily. 01/10/18   Cathren Laine, MD  predniSONE (DELTASONE) 20 MG tablet Take 3 tablets (60 mg total) by mouth daily with breakfast for 4 days. 03/16/18 03/20/18  Virgina Norfolk, DO  promethazine (PHENERGAN) 25 MG tablet Take 1 tablet (25 mg total) by mouth every 6 (six) hours as needed for nausea or vomiting. 02/28/18   Charlynne Pander, MD  rizatriptan (MAXALT) 5 MG tablet Take 5 mg by mouth every 2 (two) hours as needed for migraine.  12/31/17   [provider]  spironolactone (ALDACTONE) 25 MG tablet Take 1 tablet (25 mg total) by mouth daily for 30 days. 03/16/18 04/15/18  Seibert Keeter, DO  thiamine 100 MG tablet Take 1 tablet (100 mg total) by mouth daily. 01/20/18   Leroy Sea, MD  traZODone (DESYREL) 50 MG tablet Take 50 mg by mouth at bedtime as needed for sleep.  12/31/17   [provider]  VENTOLIN HFA 108 (90 Base) MCG/ACT inhaler Inhale 2 puffs into the lungs every 4 (four) hours as needed for wheezing or shortness of breath. 03/16/18   Virgina Norfolk, DO    Family History Family History  Problem Relation Age of Onset  . Arrhythmia Mother   . Heart disease Father   . Hypertension Father     Social History Social History   Tobacco Use  . Smoking status: Current Every Day Smoker    Packs/day: 1.00    Years: 35.00    Pack years: 35.00    Types: Cigarettes  . Smokeless tobacco: Never Used  Substance Use Topics  . Alcohol use: Yes    Comment: 1 bottle of wine per day per patient.   . Drug use: No     Allergies   Patient has no known allergies.   Review of Systems Review of Systems  Unable to perform ROS: Mental status change     Physical Exam Updated Vital Signs   ED Triage Vitals  Enc Vitals Group     BP 03/16/18 1745 (!) 117/57     Pulse Rate 03/16/18 1745 95     Resp 03/16/18 1745 14     Temp 03/16/18 1745 97.8 F (36.6 C)     Temp Source 03/16/18 1745 Oral      SpO2 03/16/18 1745 100 %     Weight --      Height --      Head Circumference --      Peak  Flow --      Pain Score 03/16/18 1752 0     Pain Loc --      Pain Edu? --      Excl. in GC? --     Physical Exam Vitals signs and nursing note reviewed.  Constitutional:      General: She is not in acute distress.    Appearance: She is well-developed.  HENT:     Head: Normocephalic and atraumatic.     Mouth/Throat:     Mouth: Mucous membranes are moist.     Pharynx: No oropharyngeal exudate or posterior oropharyngeal erythema.  Eyes:     Extraocular Movements: Extraocular movements intact.     Conjunctiva/sclera: Conjunctivae normal.     Pupils: Pupils are equal, round, and reactive to light.  Neck:     Musculoskeletal: Normal range of motion and neck supple. No muscular tenderness.  Cardiovascular:     Rate and Rhythm: Normal rate and regular rhythm.     Pulses: Normal pulses.     Heart sounds: Normal heart sounds. No murmur.  Pulmonary:     Effort: Pulmonary effort is normal. No respiratory distress.     Breath sounds: Wheezing present.  Abdominal:     General: Abdomen is flat. There is no distension.     Palpations: Abdomen is soft.     Tenderness: There is no abdominal tenderness. There is no guarding or rebound.  Musculoskeletal:     Right lower leg: Edema present.     Left lower leg: Edema present.  Skin:    General: Skin is warm and dry.     Capillary Refill: Capillary refill takes less than 2 seconds.     Coloration: Skin is not jaundiced.  Neurological:     General: No focal deficit present.     Mental Status: She is alert.     Comments: Patient opens her eyes spontaneously, moves all of her extremities, follows commands, is able to answer some questions, appears intoxicated, can smell alcohol on her breath  Psychiatric:        Mood and Affect: Mood normal.      ED Treatments / Results  Labs (all labs ordered are listed, but only abnormal results are  displayed) Labs Reviewed  CBC WITH DIFFERENTIAL/PLATELET - Abnormal; Notable for the following components:      Result Value   RBC 3.72 (*)    Hemoglobin 9.5 (*)    HCT 32.9 (*)    MCH 25.5 (*)    MCHC 28.9 (*)    RDW 16.1 (*)    Platelets 130 (*)    All other components within normal limits  BASIC METABOLIC PANEL - Abnormal; Notable for the following components:   Glucose, Bld 108 (*)    Calcium 8.1 (*)    All other components within normal limits  ETHANOL - Abnormal; Notable for the following components:   Alcohol, Ethyl (B) 166 (*)    All other components within normal limits  HEPATIC FUNCTION PANEL - Abnormal; Notable for the following components:   Albumin 3.4 (*)    Bilirubin, Direct 0.3 (*)    All other components within normal limits  AMMONIA - Abnormal; Notable for the following components:   Ammonia 40 (*)    All other components within normal limits  LIPASE, BLOOD  URINALYSIS, ROUTINE W REFLEX MICROSCOPIC  BRAIN NATRIURETIC PEPTIDE  BASIC METABOLIC PANEL  CBC    EKG None  Radiology Ct Head Wo Contrast  Result  Date: 03/16/2018 CLINICAL DATA:  Altered level of consciousness. History of alcohol ingestion. Responsive to painful stimuli. EXAM: CT HEAD WITHOUT CONTRAST TECHNIQUE: Contiguous axial images were obtained from the base of the skull through the vertex without intravenous contrast. COMPARISON:  03/19/2017 FINDINGS: Motion artifact limits examination. Brain: Mild diffuse cerebral atrophy. Mild ventricular dilatation consistent with central atrophy. Patchy low-attenuation changes in the deep white matter likely due to small vessel ischemia. No mass-effect or midline shift. No abnormal extra-axial fluid collections. Gray-white matter junctions are distinct. Basal cisterns are not effaced. No acute intracranial hemorrhage. Vascular: No hyperdense vessel or unexpected calcification. Skull: Calvarium appears intact. No acute depressed skull fractures identified.  Sinuses/Orbits: Visualized paranasal sinuses and mastoid air cells are clear. Visualization is severely limited due to motion artifact. Other: None. IMPRESSION: No acute intracranial abnormalities. Chronic atrophy and small vessel ischemic changes. Electronically Signed   By: Burman NievesWilliam  Stevens M.D.   On: 03/16/2018 21:26   Dg Chest Portable 1 View  Result Date: 03/16/2018 CLINICAL DATA:  Poorly responsive after alcohol intake. EXAM: PORTABLE CHEST 1 VIEW COMPARISON:  Chest radiograph February 28, 2018 FINDINGS: Stable cardiomegaly. Interstitial prominence with pulmonary vascular congestion. No pleural effusion or focal consolidation. No pneumothorax. Old LEFT rib fractures. Breast implants. IMPRESSION: Stable cardiomegaly and pulmonary vascular congestion. Interstitial prominence seen with atypical infection and/or pulmonary edema. Electronically Signed   By: Awilda Metroourtnay  Bloomer M.D.   On: 03/16/2018 18:25    Procedures .Critical Care Performed by: Virgina Norfolkuratolo, Tanaja Ganger, DO Authorized by: Virgina Norfolkuratolo, Jhoanna Heyde, DO   Critical care provider statement:    Critical care time (minutes):  50   Critical care was necessary to treat or prevent imminent or life-threatening deterioration of the following conditions:  Respiratory failure, hepatic failure and CNS failure or compromise   Critical care was time spent personally by me on the following activities:  Blood draw for specimens, development of treatment plan with patient or surrogate, discussions with primary provider, evaluation of patient's response to treatment, examination of patient, obtaining history from patient or surrogate, ordering and performing treatments and interventions, ordering and review of laboratory studies, ordering and review of radiographic studies, review of old charts, re-evaluation of patient's condition and pulse oximetry   I assumed direction of critical care for this patient from another provider in my specialty: no     (including critical  care time)  Medications Ordered in ED Medications  LORazepam (ATIVAN) injection 0-4 mg ( Intravenous See Alternative 03/17/18 0001)    Or  LORazepam (ATIVAN) tablet 0-4 mg (1 mg Oral Given 03/17/18 0001)  LORazepam (ATIVAN) injection 0-4 mg (has no administration in time range)    Or  LORazepam (ATIVAN) tablet 0-4 mg (has no administration in time range)  thiamine (VITAMIN B-1) tablet 100 mg (has no administration in time range)    Or  thiamine (B-1) injection 100 mg (has no administration in time range)  ipratropium-albuterol (DUONEB) 0.5-2.5 (3) MG/3ML nebulizer solution 3 mL (has no administration in time range)  albuterol (PROVENTIL) (2.5 MG/3ML) 0.083% nebulizer solution 2.5 mg (has no administration in time range)  methylPREDNISolone sodium succinate (SOLU-MEDROL) 125 mg/2 mL injection 60 mg (has no administration in time range)  dextromethorphan-guaiFENesin (MUCINEX DM) 30-600 MG per 12 hr tablet 1 tablet (has no administration in time range)  enoxaparin (LOVENOX) injection 40 mg (has no administration in time range)  acetaminophen (TYLENOL) tablet 650 mg (has no administration in time range)    Or  acetaminophen (TYLENOL) suppository 650 mg (has  no administration in time range)  ondansetron (ZOFRAN) tablet 4 mg (has no administration in time range)    Or  ondansetron (ZOFRAN) injection 4 mg (has no administration in time range)  sodium chloride 0.9 % bolus 500 mL (0 mLs Intravenous Stopped 03/16/18 2137)  ipratropium-albuterol (DUONEB) 0.5-2.5 (3) MG/3ML nebulizer solution 3 mL (3 mLs Nebulization Given 03/16/18 1817)  LORazepam (ATIVAN) tablet 1 mg (1 mg Oral Given 03/17/18 0000)  predniSONE (DELTASONE) tablet 60 mg (60 mg Oral Given 03/17/18 0001)  pantoprazole (PROTONIX) injection 40 mg (40 mg Intravenous Given 03/17/18 0001)  ondansetron (ZOFRAN) 4 MG/2ML injection (4 mg  Given 03/17/18 0003)     Initial Impression / Assessment and Plan / ED Course  I have reviewed the triage vital  signs and the nursing notes.  Pertinent labs & imaging results that were available during my care of the patient were reviewed by me and considered in my medical decision making (see chart for details).     Anne Black is a 58 year old female with history of ADHD, alcoholism, cirrhosis, MS who presents to the ED with altered mental status.  Patient with normal vitals.  No fever.  Patient was found by a friend severely intoxicated.  He states that there is about 3 bottles of wine next to her.  He states that she appears to be use her gabapentin as well.  Patient appears alert and moving all of her extremities.  She appears intoxicated. Protecting airway.  Pupils are equal and reactive.  There does not appear to be any acute neurological problem.  Patient has some abdominal distention but no tenderness on exam.  She does have swelling in her legs.  The right is greater than the left.  Patient likely with alcohol intoxication.  However, could be encephalopathic from liver failure.  Bedside ultrasound showed no large amounts of ascites.  Patient does not have any fever.  Will obtain lab work, chest x-ray, head CT.  Patient with chest x-ray that shows possible aspiration pneumonia.  Does show possible volume.  No pneumothorax, no major pleural effusions.  Patient had no leukocytosis.  Gallbladder and liver enzymes within normal limits.  Lipase normal.  Patient with elevated alcohol.  Ammonia overall unremarkable.  CT head showed no acute findings.  Patient was allowed to metabolize for several hours in the ED and had improvement of her mental status.  Overall upon my reevaluation she is clinically sober.    Had a prolonged discussion with the patient about how I suspect that she has a possible aspiration pneumonia.  She has been hypoxic and is on 2 L of oxygen now.  She does have some wheezing throughout on exam.  Suspect a COPD exacerbation.  Was given breathing treatments and steroids.  She appears to be  possibly volume overloaded as well likely from liver failure.  She has been noncompliant with her medications.  I do not have any concern for SBP.  She is not septic.  However patient likely needs admission for possible aspiration pneumonia and COPD exacerbation with new respiratory failure.  She likely needs to be diuresed.  She needs to be restarted on all of her medications. Need likely DVT study to RLE although likely from liver failure. Patient will likely need to be detox from alcohol as well during this time. Given ativan in the ED.  Patient was very hesitant to be admitted.  She initially was going to leave AGAINST MEDICAL ADVICE but then prior to leaving she  changed her mind.  Hospitalist was then consulted and patient was admitted for further care.  This chart was dictated using voice recognition software.  Despite best efforts to proofread,  errors can occur which can change the documentation meaning.    Final Clinical Impressions(s) / ED Diagnoses   Final diagnoses:  ETOH abuse  COPD exacerbation (HCC)  Aspiration pneumonia, unspecified aspiration pneumonia type, unspecified laterality, unspecified part of lung (HCC)  Acute respiratory failure with hypoxia [facility](HCC)      Dorinne Graeff, DO 03/17/18 0039    Virgina Norfolkuratolo, Churchill Grimsley, DO 03/17/18 0040

## 2018-03-16 NOTE — ED Triage Notes (Signed)
Per [facility] pt coming from home called out by roommate stating patient altered. Another friend called stating patient has had a large amount of alcohol. Patient responsive to painful stimuli.

## 2018-03-17 ENCOUNTER — Observation Stay (HOSPITAL_BASED_OUTPATIENT_CLINIC_OR_DEPARTMENT_OTHER): Payer: 59

## 2018-03-17 DIAGNOSIS — F319 Bipolar disorder, unspecified: Secondary | ICD-10-CM

## 2018-03-17 DIAGNOSIS — F10129 Alcohol abuse with intoxication, unspecified: Secondary | ICD-10-CM

## 2018-03-17 DIAGNOSIS — J9601 Acute respiratory failure with hypoxia: Secondary | ICD-10-CM | POA: Diagnosis not present

## 2018-03-17 DIAGNOSIS — F1010 Alcohol abuse, uncomplicated: Secondary | ICD-10-CM | POA: Insufficient documentation

## 2018-03-17 DIAGNOSIS — F419 Anxiety disorder, unspecified: Secondary | ICD-10-CM

## 2018-03-17 DIAGNOSIS — J9621 Acute and chronic respiratory failure with hypoxia: Secondary | ICD-10-CM | POA: Diagnosis present

## 2018-03-17 DIAGNOSIS — R609 Edema, unspecified: Secondary | ICD-10-CM | POA: Diagnosis not present

## 2018-03-17 DIAGNOSIS — G9341 Metabolic encephalopathy: Secondary | ICD-10-CM | POA: Diagnosis not present

## 2018-03-17 LAB — BASIC METABOLIC PANEL
Anion gap: 12 (ref 5–15)
BUN: 5 mg/dL — ABNORMAL LOW (ref 6–20)
CO2: 28 mmol/L (ref 22–32)
Calcium: 8.2 mg/dL — ABNORMAL LOW (ref 8.9–10.3)
Chloride: 99 mmol/L (ref 98–111)
Creatinine, Ser: 0.46 mg/dL (ref 0.44–1.00)
GFR calc Af Amer: >60 mL/min (ref >60)
GFR calc non Af Amer: >60 mL/min (ref >60)
Glucose, Bld: 134 mg/dL — ABNORMAL HIGH (ref 70–99)
Potassium: 4.1 mmol/L (ref 3.5–5.1)
Sodium: 139 mmol/L (ref 135–145)

## 2018-03-17 LAB — CBC
HCT: 30 % — ABNORMAL LOW (ref 36.0–46.0)
Hemoglobin: 8.9 g/dL — ABNORMAL LOW (ref 12.0–15.0)
MCH: 25.7 pg — ABNORMAL LOW (ref 26.0–34.0)
MCHC: 29.7 g/dL — ABNORMAL LOW (ref 30.0–36.0)
MCV: 86.7 fL (ref 80.0–100.0)
NRBC: 0 % (ref 0.0–0.2)
PLATELETS: 99 10*3/uL — AB (ref 150–400)
RBC: 3.46 MIL/uL — ABNORMAL LOW (ref 3.87–5.11)
RDW: 16.2 % — ABNORMAL HIGH (ref 11.5–15.5)
WBC: 4 10*3/uL (ref 4.0–10.5)

## 2018-03-17 LAB — URINALYSIS, ROUTINE W REFLEX MICROSCOPIC
Bilirubin Urine: NEGATIVE
Glucose, UA: NEGATIVE mg/dL
Hgb urine dipstick: NEGATIVE
KETONES UR: NEGATIVE mg/dL
Nitrite: NEGATIVE
PROTEIN: NEGATIVE mg/dL
Specific Gravity, Urine: 1.009 (ref 1.005–1.030)
pH: 6 (ref 5.0–8.0)

## 2018-03-17 LAB — BRAIN NATRIURETIC PEPTIDE: B NATRIURETIC PEPTIDE 5: 643 pg/mL — AB (ref 0.0–100.0)

## 2018-03-17 MED ORDER — ACETAMINOPHEN 325 MG PO TABS: 650.0000 mg | ORAL_TABLET | Freq: Four times a day (QID) | ORAL | Status: DC | PRN

## 2018-03-17 MED ORDER — PROMETHAZINE HCL 25 MG PO TABS: 25.0000 mg | ORAL_TABLET | Freq: Four times a day (QID) | ORAL | 0 refills | Status: DC | PRN

## 2018-03-17 MED ORDER — HYDROXYZINE HCL 25 MG PO TABS
25.0000 mg | ORAL_TABLET | Freq: Every day | ORAL | Status: DC | PRN
  Administered 2018-03-17: 50 mg via ORAL
  Filled 2018-03-17: qty 2

## 2018-03-17 MED ORDER — LACTULOSE 10 GM/15ML PO SOLN
20.0000 g | Freq: Two times a day (BID) | ORAL | Status: DC
  Filled 2018-03-17 (×2): qty 30

## 2018-03-17 MED ORDER — HYDROXYZINE HCL 25 MG PO TABS: 25.0000 mg | ORAL_TABLET | Freq: Every day | ORAL | 0 refills | Status: DC | PRN

## 2018-03-17 MED ORDER — RIZATRIPTAN BENZOATE 5 MG PO TABS: 5.0000 mg | ORAL_TABLET | ORAL | 0 refills | Status: DC | PRN

## 2018-03-17 MED ORDER — FOLIC ACID 1 MG PO TABS: 1.0000 mg | ORAL_TABLET | Freq: Every day | ORAL | 0 refills | Status: DC

## 2018-03-17 MED ORDER — GABAPENTIN 100 MG PO CAPS
200.0000 mg | ORAL_CAPSULE | Freq: Three times a day (TID) | ORAL | Status: DC
  Administered 2018-03-17 (×2): 200 mg via ORAL
  Filled 2018-03-17 (×2): qty 2

## 2018-03-17 MED ORDER — LACTULOSE 10 GM/15ML PO SOLN: 20.0000 g | Freq: Two times a day (BID) | ORAL | 0 refills | Status: DC

## 2018-03-17 MED ORDER — FLUOXETINE HCL 20 MG PO CAPS
20.0000 mg | ORAL_CAPSULE | Freq: Every day | ORAL | Status: DC
  Administered 2018-03-17: 20 mg via ORAL
  Filled 2018-03-17: qty 1

## 2018-03-17 MED ORDER — ONDANSETRON HCL 4 MG PO TABS
4.0000 mg | ORAL_TABLET | Freq: Four times a day (QID) | ORAL | Status: DC | PRN
  Administered 2018-03-17: 4 mg via ORAL
  Filled 2018-03-17: qty 1

## 2018-03-17 MED ORDER — SPIRONOLACTONE 25 MG PO TABS: 25.0000 mg | ORAL_TABLET | Freq: Every day | ORAL | 0 refills | Status: DC

## 2018-03-17 MED ORDER — MAGNESIUM OXIDE 400 (241.3 MG) MG PO TABS
400.0000 mg | ORAL_TABLET | Freq: Every day | ORAL | Status: DC
  Administered 2018-03-17: 400 mg via ORAL

## 2018-03-17 MED ORDER — CHLORDIAZEPOXIDE HCL 25 MG PO CAPS: ORAL_CAPSULE | ORAL | 0 refills | Status: DC

## 2018-03-17 MED ORDER — PROMETHAZINE HCL 25 MG PO TABS: 25.0000 mg | ORAL_TABLET | Freq: Four times a day (QID) | ORAL | Status: DC | PRN

## 2018-03-17 MED ORDER — HYDROCORTISONE 1 % EX CREA
1.0000 "application " | TOPICAL_CREAM | Freq: Every day | CUTANEOUS | Status: DC | PRN
  Filled 2018-03-17: qty 28

## 2018-03-17 MED ORDER — THIAMINE HCL 100 MG PO TABS: 100.0000 mg | ORAL_TABLET | Freq: Every day | ORAL | Status: DC

## 2018-03-17 MED ORDER — THIAMINE HCL 100 MG PO TABS: 100.0000 mg | ORAL_TABLET | Freq: Every day | ORAL | 0 refills | Status: DC

## 2018-03-17 MED ORDER — FUROSEMIDE 40 MG PO TABS
40.0000 mg | ORAL_TABLET | Freq: Every day | ORAL | Status: DC
  Administered 2018-03-17: 40 mg via ORAL
  Filled 2018-03-17: qty 1

## 2018-03-17 MED ORDER — NEOMYCIN-POLYMYXIN-HC 3.5-10000-1 OT SOLN: 3.0000 [drp] | Freq: Three times a day (TID) | OTIC | 0 refills | Status: DC

## 2018-03-17 MED ORDER — LOPERAMIDE HCL 2 MG PO CAPS: 2.0000 mg | ORAL_CAPSULE | Freq: Every day | ORAL | Status: DC | PRN

## 2018-03-17 MED ORDER — SPIRONOLACTONE 25 MG PO TABS
25.0000 mg | ORAL_TABLET | Freq: Every day | ORAL | Status: DC
  Administered 2018-03-17: 25 mg via ORAL
  Filled 2018-03-17 (×2): qty 1

## 2018-03-17 MED ORDER — FUROSEMIDE 40 MG PO TABS: 40.0000 mg | ORAL_TABLET | Freq: Every day | ORAL | 0 refills | Status: DC

## 2018-03-17 MED ORDER — ALUM & MAG HYDROXIDE-SIMETH 200-200-20 MG/5ML PO SUSP
30.0000 mL | Freq: Three times a day (TID) | ORAL | Status: DC
  Administered 2018-03-17: 30 mL via ORAL
  Filled 2018-03-17 (×3): qty 30

## 2018-03-17 MED ORDER — IPRATROPIUM-ALBUTEROL 0.5-2.5 (3) MG/3ML IN SOLN
3.0000 mL | RESPIRATORY_TRACT | Status: DC
  Administered 2018-03-17 (×3): 3 mL via RESPIRATORY_TRACT
  Filled 2018-03-17 (×4): qty 3

## 2018-03-17 MED ORDER — ENOXAPARIN SODIUM 40 MG/0.4ML ~~LOC~~ SOLN
40.0000 mg | SUBCUTANEOUS | Status: DC
  Administered 2018-03-17: 40 mg via SUBCUTANEOUS
  Filled 2018-03-17 (×2): qty 0.4

## 2018-03-17 MED ORDER — TRAZODONE HCL 50 MG PO TABS: 50.0000 mg | ORAL_TABLET | Freq: Every evening | ORAL | Status: DC | PRN

## 2018-03-17 MED ORDER — FOLIC ACID 1 MG PO TABS
1.0000 mg | ORAL_TABLET | Freq: Every day | ORAL | Status: DC
  Administered 2018-03-17: 1 mg via ORAL
  Filled 2018-03-17: qty 1

## 2018-03-17 MED ORDER — ACETAMINOPHEN 650 MG RE SUPP: 650.0000 mg | Freq: Four times a day (QID) | RECTAL | Status: DC | PRN

## 2018-03-17 MED ORDER — BISMUTH SUBSALICYLATE 262 MG PO CHEW
524.0000 mg | CHEWABLE_TABLET | ORAL | Status: DC | PRN
  Filled 2018-03-17: qty 2

## 2018-03-17 MED ORDER — PANTOPRAZOLE SODIUM 40 MG PO TBEC
40.0000 mg | DELAYED_RELEASE_TABLET | Freq: Every day | ORAL | Status: DC
  Administered 2018-03-17: 40 mg via ORAL
  Filled 2018-03-17: qty 1

## 2018-03-17 MED ORDER — CHLORDIAZEPOXIDE HCL 5 MG PO CAPS: 10.0000 mg | ORAL_CAPSULE | Freq: Three times a day (TID) | ORAL | Status: DC

## 2018-03-17 MED ORDER — METHYLPREDNISOLONE SODIUM SUCC 125 MG IJ SOLR
60.0000 mg | Freq: Two times a day (BID) | INTRAMUSCULAR | Status: DC
  Administered 2018-03-17: 60 mg via INTRAVENOUS
  Filled 2018-03-17: qty 2

## 2018-03-17 MED ORDER — ONDANSETRON HCL 4 MG/2ML IJ SOLN
4.0000 mg | Freq: Four times a day (QID) | INTRAMUSCULAR | Status: DC | PRN
  Administered 2018-03-17: 4 mg via INTRAVENOUS
  Filled 2018-03-17: qty 2

## 2018-03-17 MED ORDER — SUMATRIPTAN SUCCINATE 50 MG PO TABS
50.0000 mg | ORAL_TABLET | ORAL | Status: DC | PRN
  Administered 2018-03-17: 50 mg via ORAL
  Filled 2018-03-17 (×2): qty 1

## 2018-03-17 MED ORDER — MOMETASONE FURO-FORMOTEROL FUM 200-5 MCG/ACT IN AERO
2.0000 | INHALATION_SPRAY | Freq: Two times a day (BID) | RESPIRATORY_TRACT | Status: DC
  Administered 2018-03-17: 2 via RESPIRATORY_TRACT
  Filled 2018-03-17: qty 8.8

## 2018-03-17 MED ORDER — DM-GUAIFENESIN ER 30-600 MG PO TB12
1.0000 | ORAL_TABLET | Freq: Two times a day (BID) | ORAL | Status: DC | PRN
  Filled 2018-03-17: qty 1

## 2018-03-17 MED ORDER — ALBUTEROL SULFATE (2.5 MG/3ML) 0.083% IN NEBU: 2.5000 mg | INHALATION_SOLUTION | RESPIRATORY_TRACT | Status: DC | PRN

## 2018-03-17 NOTE — Progress Notes (Signed)
CSW met with patient to provide alcohol rehab resources. CSW provided information on local options, and patient said she'd already called them and they will not be able to take her. Patient said she's looked up a couple of other facilities in [place], [place], and [place]. Patient said she's been to several facilities in the area before, so she knows how this works. Patient says she's going to be leaving the hospital because the doctor isn't giving her what she wants.   CSW signing off. Patient has resources and is working to locate her own rehabilitation placement.  Laveda Abbe, [place] Clinical Social Worker [phone]

## 2018-03-17 NOTE — Progress Notes (Signed)
*  PRELIMINARY RESULTS* Vascular Ultrasound Bilateral lower extremity venous duplex has been completed.  Please see CV Proc tab for preliminary results.  Anne Black 03/17/2018, 10:33 AM

## 2018-03-17 NOTE — Plan of Care (Signed)
Adequate for discharge.

## 2018-03-17 NOTE — ED Notes (Signed)
Attempted to call report to floor 

## 2018-03-18 ENCOUNTER — Emergency Department (HOSPITAL_COMMUNITY): Payer: 59

## 2018-03-18 ENCOUNTER — Inpatient Hospital Stay (HOSPITAL_COMMUNITY)
Admission: EM | Admit: 2018-03-18 | Discharge: 2018-03-20 | DRG: 190 | Discharge status: Against Medical Advice | Payer: 59 | Attending: Internal Medicine | Admitting: Internal Medicine

## 2018-03-18 ENCOUNTER — Encounter (HOSPITAL_COMMUNITY): Payer: Self-pay | Admitting: Emergency Medicine

## 2018-03-18 DIAGNOSIS — D696 Thrombocytopenia, unspecified: Secondary | ICD-10-CM | POA: Diagnosis present

## 2018-03-18 DIAGNOSIS — F1010 Alcohol abuse, uncomplicated: Secondary | ICD-10-CM | POA: Diagnosis present

## 2018-03-18 DIAGNOSIS — F10239 Alcohol dependence with withdrawal, unspecified: Secondary | ICD-10-CM | POA: Diagnosis present

## 2018-03-18 DIAGNOSIS — W19XXXA Unspecified fall, initial encounter: Secondary | ICD-10-CM | POA: Diagnosis present

## 2018-03-18 DIAGNOSIS — R0902 Hypoxemia: Secondary | ICD-10-CM

## 2018-03-18 DIAGNOSIS — R109 Unspecified abdominal pain: Secondary | ICD-10-CM

## 2018-03-18 DIAGNOSIS — Y92009 Unspecified place in unspecified non-institutional (private) residence as the place of occurrence of the external cause: Secondary | ICD-10-CM

## 2018-03-18 DIAGNOSIS — G35 Multiple sclerosis: Secondary | ICD-10-CM | POA: Diagnosis present

## 2018-03-18 DIAGNOSIS — Y908 Blood alcohol level of 240 mg/100 ml or more: Secondary | ICD-10-CM | POA: Diagnosis present

## 2018-03-18 DIAGNOSIS — Z79899 Other long term (current) drug therapy: Secondary | ICD-10-CM

## 2018-03-18 DIAGNOSIS — F419 Anxiety disorder, unspecified: Secondary | ICD-10-CM | POA: Diagnosis present

## 2018-03-18 DIAGNOSIS — S0990XA Unspecified injury of head, initial encounter: Secondary | ICD-10-CM

## 2018-03-18 DIAGNOSIS — R188 Other ascites: Secondary | ICD-10-CM

## 2018-03-18 DIAGNOSIS — E876 Hypokalemia: Secondary | ICD-10-CM | POA: Diagnosis present

## 2018-03-18 DIAGNOSIS — J9602 Acute respiratory failure with hypercapnia: Secondary | ICD-10-CM

## 2018-03-18 DIAGNOSIS — F10229 Alcohol dependence with intoxication, unspecified: Secondary | ICD-10-CM | POA: Diagnosis present

## 2018-03-18 DIAGNOSIS — F10939 Alcohol use, unspecified with withdrawal, unspecified: Secondary | ICD-10-CM | POA: Diagnosis present

## 2018-03-18 DIAGNOSIS — J9622 Acute and chronic respiratory failure with hypercapnia: Secondary | ICD-10-CM | POA: Diagnosis present

## 2018-03-18 DIAGNOSIS — J9811 Atelectasis: Secondary | ICD-10-CM | POA: Diagnosis present

## 2018-03-18 DIAGNOSIS — M810 Age-related osteoporosis without current pathological fracture: Secondary | ICD-10-CM | POA: Diagnosis present

## 2018-03-18 DIAGNOSIS — F17210 Nicotine dependence, cigarettes, uncomplicated: Secondary | ICD-10-CM | POA: Diagnosis present

## 2018-03-18 DIAGNOSIS — S0285XA Fracture of orbit, unspecified, initial encounter for closed fracture: Secondary | ICD-10-CM | POA: Diagnosis present

## 2018-03-18 DIAGNOSIS — K219 Gastro-esophageal reflux disease without esophagitis: Secondary | ICD-10-CM | POA: Diagnosis present

## 2018-03-18 DIAGNOSIS — Z7951 Long term (current) use of inhaled steroids: Secondary | ICD-10-CM

## 2018-03-18 DIAGNOSIS — F3162 Bipolar disorder, current episode mixed, moderate: Secondary | ICD-10-CM | POA: Diagnosis present

## 2018-03-18 DIAGNOSIS — F1020 Alcohol dependence, uncomplicated: Secondary | ICD-10-CM | POA: Diagnosis present

## 2018-03-18 DIAGNOSIS — S0231XA Fracture of orbital floor, right side, initial encounter for closed fracture: Secondary | ICD-10-CM | POA: Diagnosis present

## 2018-03-18 DIAGNOSIS — S0511XA Contusion of eyeball and orbital tissues, right eye, initial encounter: Secondary | ICD-10-CM | POA: Diagnosis present

## 2018-03-18 DIAGNOSIS — Z5329 Procedure and treatment not carried out because of patient's decision for other reasons: Secondary | ICD-10-CM | POA: Diagnosis present

## 2018-03-18 DIAGNOSIS — S0101XA Laceration without foreign body of scalp, initial encounter: Secondary | ICD-10-CM

## 2018-03-18 DIAGNOSIS — J9601 Acute respiratory failure with hypoxia: Secondary | ICD-10-CM | POA: Diagnosis present

## 2018-03-18 DIAGNOSIS — D649 Anemia, unspecified: Secondary | ICD-10-CM | POA: Diagnosis present

## 2018-03-18 DIAGNOSIS — R55 Syncope and collapse: Secondary | ICD-10-CM | POA: Diagnosis present

## 2018-03-18 DIAGNOSIS — R10819 Abdominal tenderness, unspecified site: Secondary | ICD-10-CM

## 2018-03-18 DIAGNOSIS — F1390 Sedative, hypnotic, or anxiolytic use, unspecified, uncomplicated: Secondary | ICD-10-CM | POA: Diagnosis present

## 2018-03-18 DIAGNOSIS — K7031 Alcoholic cirrhosis of liver with ascites: Secondary | ICD-10-CM | POA: Diagnosis present

## 2018-03-18 DIAGNOSIS — Z720 Tobacco use: Secondary | ICD-10-CM | POA: Diagnosis present

## 2018-03-18 DIAGNOSIS — G43909 Migraine, unspecified, not intractable, without status migrainosus: Secondary | ICD-10-CM | POA: Diagnosis present

## 2018-03-18 DIAGNOSIS — J441 Chronic obstructive pulmonary disease with (acute) exacerbation: Principal | ICD-10-CM | POA: Diagnosis present

## 2018-03-18 DIAGNOSIS — J9621 Acute and chronic respiratory failure with hypoxia: Secondary | ICD-10-CM | POA: Diagnosis present

## 2018-03-18 LAB — CBG MONITORING, ED: Glucose-Capillary: 112 mg/dL — ABNORMAL HIGH (ref 70–99)

## 2018-03-18 MED ORDER — SODIUM CHLORIDE 0.9% FLUSH
3.0000 mL | Freq: Once | INTRAVENOUS | Status: AC
  Administered 2018-03-19: 3 mL via INTRAVENOUS

## 2018-03-18 MED ORDER — TETANUS-DIPHTH-ACELL PERTUSSIS 5-2.5-18.5 LF-MCG/0.5 IM SUSP
0.5000 mL | Freq: Once | INTRAMUSCULAR | Status: AC
  Administered 2018-03-19: 0.5 mL via INTRAMUSCULAR
  Filled 2018-03-18: qty 0.5

## 2018-03-18 MED ORDER — ALBUTEROL SULFATE (2.5 MG/3ML) 0.083% IN NEBU
5.0000 mg | INHALATION_SOLUTION | Freq: Once | RESPIRATORY_TRACT | Status: AC
  Administered 2018-03-19: 5 mg via RESPIRATORY_TRACT
  Filled 2018-03-18: qty 6

## 2018-03-18 NOTE — ED Triage Notes (Signed)
Patient arrives by [facility] with complaints of found on floor by family with lac to back of head-injury to right cheekbone, hx COPD-EMS states patient very tight-administered Mag 2 grams, Solumedrol 125 mg and albuterol 10 mg and atrovent 1 mg, patient just discharged from hospital 2 days ago-patient removed her c-collar in route. O2 sat dropped 80's-on O2 2l/[place]. Atrial fib with rate 80-90.

## 2018-03-18 NOTE — ED Provider Notes (Signed)
TIME SEEN: 11:25 PM  CHIEF COMPLAINT: Altered mental status, fall  HPI: Patient is a 58 year old female with history of alcoholism, cirrhosis, bipolar disorder, COPD who presents to the emergency department with EMS after a fall.  Patient currently altered and unable to answer questions.  EMS reports that patient seemed to have some respiratory distress and was given albuterol, Atrovent, magnesium, Solu-Medrol.  Blood glucose normal.  Has scalp laceration and bruising to the right eye.  ROS: Level 5 caveat for altered mental status  PAST MEDICAL HISTORY/PAST SURGICAL HISTORY:  Past Medical History:  Diagnosis Date  . ADHD (attention deficit hyperactivity disorder)   . Alcoholism (HCC)   . Anxiety   . Arthritis   . Ascites   . Bipolar disorder (HCC)   . Cirrhosis (HCC)   . COPD (chronic obstructive pulmonary disease) (HCC)   . Hypokalemia   . Migraine   . Multiple sclerosis (HCC)   . Narcolepsy   . Osteoporosis     MEDICATIONS:  Prior to Admission medications   Medication Sig Start Date End Date Taking? Authorizing Provider  aluminum-magnesium hydroxide-simethicone (MAALOX) 200-200-20 MG/5ML SUSP Take 30 mLs by mouth 4 (four) times daily as needed (indigestion).     [provider]  azithromycin (ZITHROMAX) 250 MG tablet Take 1 tablet (250 mg total) by mouth daily. Take first 2 tablets together, then 1 every day until finished. 03/16/18   Curatolo, Adam, DO  bismuth subsalicylate (PEPTO-BISMOL) 262 MG chewable tablet Chew 2 tablets (524 mg total) by mouth as needed for diarrhea or loose stools. Patient not taking: Reported on 03/17/2018 07/08/17   Molpus, Jonny Ruiz, MD  chlordiazePOXIDE (LIBRIUM) 25 MG capsule 50mg  PO TID x 1D, then 25-50mg  PO BID X 1D, then 25-50mg  PO QD X 1D 03/17/18   Smith, Arn Medal A, MD  FLUoxetine (PROZAC) 20 MG capsule Take 20 mg by mouth daily. 12/31/17   [provider]  Fluticasone-Salmeterol (WIXELA INHUB) 250-50 MCG/DOSE AEPB Inhale 1 puff into the  lungs 2 (two) times daily.    [provider]  folic acid (FOLVITE) 1 MG tablet Take 1 tablet (1 mg total) by mouth daily. 03/17/18   Clydie Braun, MD  furosemide (LASIX) 40 MG tablet Take 1 tablet (40 mg total) by mouth daily. 03/17/18   Clydie Braun, MD  gabapentin (NEURONTIN) 100 MG capsule Take 2 capsules (200 mg total) by mouth 3 (three) times daily. Patient taking differently: Take 300 mg by mouth 3 (three) times daily.  02/28/18   Charlynne Pander, MD  hydrocortisone cream 1 % Apply topically 3 (three) times daily. Patient taking differently: Apply 1 application topically daily as needed for itching.  04/20/17   Marguerita Merles Latif, DO  hydrOXYzine (ATARAX/VISTARIL) 25 MG tablet Take 1 tablet (25 mg total) by mouth daily as needed (anxiety). 03/17/18   Clydie Braun, MD  lactulose (CHRONULAC) 10 GM/15ML solution Take 30 mLs (20 g total) by mouth 2 (two) times daily. 03/17/18   Clydie Braun, MD  loperamide (IMODIUM A-D) 2 MG tablet Take 2 mg by mouth daily as needed for diarrhea or loose stools.     [provider]  magnesium oxide (MAG-OX) 400 MG tablet Take 1 tablet (400 mg total) by mouth daily. Patient not taking: Reported on 03/17/2018 02/23/18   Jacalyn Lefevre, MD  neomycin-polymyxin-hydrocortisone (CORTISPORIN) OTIC solution Place 3 drops into both ears 3 (three) times daily for 10 days. 03/17/18 03/27/18  Clydie Braun, MD  nicotine (NICODERM CQ -  DOSED IN MG/24 HOURS) 14 mg/24hr patch Place 1 patch onto the skin daily. 12/19/17   [provider]  pantoprazole (PROTONIX) 40 MG tablet Take 1 tablet (40 mg total) by mouth daily. 03/16/18   Curatolo, Adam, DO  predniSONE (DELTASONE) 20 MG tablet Take 3 tablets (60 mg total) by mouth daily with breakfast for 4 days. 03/16/18 03/20/18  Curatolo, Adam, DO  promethazine (PHENERGAN) 25 MG tablet Take 1 tablet (25 mg total) by mouth every 6 (six) hours as needed for nausea or vomiting. 03/17/18   Clydie Braun, MD   rizatriptan (MAXALT) 5 MG tablet Take 1 tablet (5 mg total) by mouth every 2 (two) hours as needed for migraine. 03/17/18   Clydie Braun, MD  spironolactone (ALDACTONE) 25 MG tablet Take 1 tablet (25 mg total) by mouth daily for 30 days. 03/17/18 04/16/18  Clydie Braun, MD  thiamine 100 MG tablet Take 1 tablet (100 mg total) by mouth daily. 03/17/18   Clydie Braun, MD  VENTOLIN HFA 108 (90 Base) MCG/ACT inhaler Inhale 2 puffs into the lungs every 4 (four) hours as needed for wheezing or shortness of breath. 03/16/18   Virgina Norfolk, DO    ALLERGIES:  No Known Allergies  SOCIAL HISTORY:  Social History   Tobacco Use  . Smoking status: Current Every Day Smoker    Packs/day: 1.00    Years: 35.00    Pack years: 35.00    Types: Cigarettes  . Smokeless tobacco: Never Used  Substance Use Topics  . Alcohol use: Yes    Comment: 1 bottle of wine per day per patient.     FAMILY HISTORY: Family History  Problem Relation Age of Onset  . Arrhythmia Mother   . Heart disease Father   . Hypertension Father     EXAM: BP 131/73 (BP Location: Right Arm)   Pulse 91   Temp 97.6 F (36.4 C) (Oral)   Resp 16   SpO2 100%  CONSTITUTIONAL: Alert and oriented to person but falls asleep quickly.  Appears intoxicated and smells of alcohol. HEAD: Normocephalic; she has right periorbital ecchymosis with tenderness around this eye, 3 cm posterior scalp laceration EYES: Conjunctivae clear, PERRL, EOMI ENT: normal nose; no rhinorrhea; moist mucous membranes; pharynx without lesions noted; no dental injury; no septal hematoma NECK: Supple, no meningismus, no LAD; no midline spinal tenderness, step-off or deformity; trachea midline CARD: RRR; S1 and S2 appreciated; no murmurs, no clicks, no rubs, no gallops RESP: Patient is hypoxic here.  No rhonchi, rales or wheezing currently.  Doing well on nasal cannula.  Does not appear to be in respiratory distress. CHEST:  chest wall stable, no crepitus or  ecchymosis or deformity, nontender to palpation; no flail chest ABD/GI: Normal bowel sounds; non-distended; soft, non-tender, no rebound, no guarding; no ecchymosis or other lesions noted PELVIS:  stable, nontender to palpation BACK:  The back appears normal and is non-tender to palpation, there is no CVA tenderness; no midline spinal tenderness, step-off or deformity EXT: Normal ROM in all joints; non-tender to palpation; no edema; normal capillary refill; no cyanosis, no bony tenderness or bony deformity of patient's extremities, no joint effusion, compartments are soft, extremities are warm and well-perfused, no ecchymosis SKIN: Normal color for age and race; warm NEURO: Moves all extremities equally, no drift, sensation to light touch intact diffusely, cranial nerves II through XII intact, speech slightly slurred but no aphasia   MEDICAL DECISION MAKING: Patient here after fall.  It  is unclear if this fall was witnessed.  Patient able to tell me if there was a syncopal event that preceded her fall.  She is intoxicated here.  Recently admitted for alcohol intoxication.  Will also check ABG to ensure patient is not hypercapnic from COPD.  Doing well on nasal cannula currently.  Given multiple medications for COPD by EMS.  Blood glucose normal.  Will obtain CT of the head, cervical spine and face.  Will repair scalp laceration.  Will check labs, urine.  ED PROGRESS: CT scan show right orbital floor fracture without entrapment.  Her extraocular movements are intact.  Discussed these findings with patient that she will need outpatient ENT follow-up.  They do not need to be consulted emergently.  Patient still sobering up.  ABG reassuring.  Labs reassuring.  Urine shows no infection.  Alcohol level 250.  Drug screen positive for benzodiazepines.   Patient awake, mentating normally.  She has some repetitive questioning.  Suspect concussion.  She is unable to tell me what happened last night.  She does  feel short of breath and appears to have increased work of breathing with ambulation.  Sats 85% with walking.  Does not wear oxygen chronically.  Put back on 2 L nasal cannula.  Will give another breathing treatment and admit for COPD exacerbation, hypoxia.  Patient comfortable with this plan.   Patient has no sign of alcohol withdrawal.  No hypertension, tachycardia, tremors.  6:08 AM Discussed patient's case with hospitalist, Dr. Loney Lohathore.  I have recommended admission and patient (and family if present) agree with this plan. Admitting physician will place admission orders.   I reviewed all nursing notes, vitals, pertinent previous records, EKGs, lab and urine results, imaging (as available).    LACERATION REPAIR Performed by: Rochele RaringKristen Jaedynn Bohlken Authorized by: Rochele RaringKristen Bellatrix Devonshire Consent: Verbal consent obtained. Risks and benefits: risks, benefits and alternatives were discussed Consent given by: patient Patient identity confirmed: provided demographic data Prepped and Draped in normal sterile fashion Wound explored  Laceration Location: Posterior scalp  Laceration Length: 3 cm  No Foreign Bodies seen or palpated  Anesthesia: None  Anesthetic total: 0 ml  Irrigation method: syringe Amount of cleaning: standard  Skin closure: Simple  Number of sutures: 3 staples  Technique: Area irrigated copiously with saline.  Wound approximation and hemostasis achieved placing 3 staples in this laceration.  Patient tolerance: Patient tolerated the procedure well with no immediate complications.    EKG Interpretation  Date/Time:  Saturday March 18 2018 23:02:28 EST Ventricular Rate:  91 PR Interval:    QRS Duration: 94 QT Interval:  376 QTC Calculation: 463 R Axis:   80 Text Interpretation:  Sinus rhythm Probable left atrial enlargement Abnormal T, consider ischemia, lateral leads No significant change since last tracing Confirmed by Maricel Swartzendruber, Baxter HireKristen [phone]([id number]) on 03/18/2018 11:28:13 PM          Finesse Fielder, Layla MawKristen N, DO 03/19/18 [id number]

## 2018-03-18 NOTE — ED Notes (Signed)
Patient is refusing to let me draw her blood, said "I am tired of this."

## 2018-03-18 NOTE — Discharge Summary (Addendum)
Anne Black, is a 58 y.o. female  DOB [birth year]  MRN [id number].  Admission date:  03/16/2018  Admitting Physician  Lorretta Harp, MD  Discharge Date:  03/17/2018   Primary MD  System, Pcp Not In  Recommendations for primary care physician for things to follow:     Discharge Diagnosis  Principal Problem:   Alcohol abuse with intoxication [facility]) Active Problems:   Depression   GERD (gastroesophageal reflux disease)   Bipolar 1 disorder (HCC)   Tobacco abuse   Nicotine abuse   Anxiety   Alcoholic cirrhosis of liver with ascites (HCC)   Alcohol abuse   COPD exacerbation (HCC)   Normocytic anemia   Acute metabolic encephalopathy   Lower leg edema   Migraine   Acute on chronic respiratory failure with hypoxia [facility])      Past Medical History:  Diagnosis Date  . ADHD (attention deficit hyperactivity disorder)   . Alcoholism (HCC)   . Anxiety   . Arthritis   . Ascites   . Bipolar disorder (HCC)   . Cirrhosis (HCC)   . COPD (chronic obstructive pulmonary disease) (HCC)   . Hypokalemia   . Migraine   . Multiple sclerosis (HCC)   . Narcolepsy   . Osteoporosis     Past Surgical History:  Procedure Laterality Date  . ABDOMINAL WALL DEFECT REPAIR N/A 12/30/2016   Procedure: EXPLORATORY LAPAROTOMY WITH REPAIR ABDOMINAL WALL VENTRAL HERNIA;  Surgeon: [name], MD;  Location: [facility] [place] OR;  Service: General;  Laterality: N/A;  . APPLICATION OF WOUND VAC N/A 12/30/2016   Procedure: APPLICATION OF WOUND VAC;  Surgeon: [name], MD;  Location: [facility] OR;  Service: General;  Laterality: N/A;  . CESAREAN SECTION  [phone]  . FRACTURE SURGERY    . HERNIA REPAIR    . MYRINGOTOMY WITH TUBE PLACEMENT Bilateral   . ORIF WRIST FRACTURE Left 09/09/2016   Procedure: OPEN REDUCTION INTERNAL FIXATION (ORIF) LEFT WRIST  FRACTURE, LEFT CARPAL TUNNEL RELEASE;  Surgeon: Dominica Severin, MD;  Location: [facility] ORS;  Service: Orthopedics;  Laterality: Left;  . TONSILLECTOMY         HPI  from the history and physical done on the day of admission:    Anne Black is a 58 y.o. female with medical history significant of COPD, alcohol abuse, bipolar, ADHD, depression, alcoholic cirrhosis with ascites, migraine headache, questionable multiple sclerosis, who presents with altered mental status and shortness of breath.  Per report, pt was found to be confused today and her friend called stating patient has had a large amount of alcohol. Her mental status has gradually improved in ED.  When I saw patient in ED, she is alert, orientated x3.  She complains of shortness of breath and dry cough, denies chest pain.  No runny nose or sore throat.  Patient states that she has chronic diffuse abdominal pain, which has not changed today.  I repeatedly asked her if she has worse abdominal pain, she states that the abdominal pain is at her  normal level today.  She also has abdominal distention.  She states that she has nausea, but no vomiting.  She also has some mild diarrhea yesterday.  Patient denies symptoms of UTI or unilateral weakness.  She states that she is supposed to use 2 L oxygen at home, but she is not using it.  She is not sure what medication she is taking exactly.  Patient has a bilateral lower leg edema (R leg is worse than the left leg).  ED Course: pt was found to have ammonia level 40, alcohol level 166, electrolytes renal function okay, temperature normal, heart rate in 90s, oxygen saturation 89% on room air, chest x-ray showed cardiomegaly with vascular congestion, CT head is negative for acute intracranial abnormalities.      Hospital Course:    1.  Alcohol abuse with acute intoxication: Acute on chronic. Patient presented and there was initial concern for stroke but CT scan of the brain was negative.  Noted to  have elevated alcohol level 166.  CIWA protocols were utilized with Ativan.  She admitted to recently using alcohol and would like to go back to AA.  Social worker was consulted to give her resources on different alcohol abuse programs in the area.  Patient was noted to be alert and oriented x3 and requesting discharge home otherwise she will leave AGAINST MEDICAL ADVICE.  She was given prescriptions for librium to help with detoxing from alcohol as she was going to the hospital today.  2.  Alcoholic cirrhosis of the liver with ascites: Patient continues to drink alcohol.  She noted complaints of abdominal pain, distention, and lower extremity swelling.  CT scan of the abdomen pelvis revealed ascites with variceal changes and portal hypertension on 02/28/2018.  Patient had mildly elevated ammonia level of 40 and had been started on lactulose along with being restared on reported home doses of Lasix and spironolactone.  Patient reported not taking these medications as prescribed and needing refills on Lasix.  Patient was given refills for these medications.  She did not undergo paracentesis during his hospitalization.   3.  Diastolic congestive heart failure: Last echocardiogram noted EF of 65-70% with grade 1 diastolic dysfunction in 02/2018.  BNP was noted to be elevated at 643.  On exam revealed lower extremity edema.  X-ray showing cardiomegaly with vascular congestion.  Patient was continued on Lasix and Spironolactone.  4.   Normocytic anemia: At baseline patient noted to have hemoglobin of 9-10.  Admission hemoglobin was noted to be 9.5 and repeat check on 2/7 noted to be 8.9.  Patient denies any reports of bleeding.  5.   Bipolar disorder, anxiety, depression: Stable.  Patient was continued on Prozac and hydroxyzine as needed.  6.  Acute on chronic respiratory failure with hypoxia due to COPD exacerbation: There are no signs of a pneumonia.  Patient had initially been started on IV Solu-Medrol,  Mucinex, and scheduled breathing treatments.  Sounded clear prior to discharge.  She was given a prescription for azithromycin and a short taper prednisone at discharge.  7.   Lower leg edema: Patient identified per family swelling.  Doppler ultrasound of lower extremity did not show any acute signs of DVT.  8.   Migraine headaches: Patient was continued on rizatriptan as needed  8.   Tobacco abuse: Chronic.  Patient was counseled on need of cessation of tobacco use.  10.   GERD: Patient was continued on Protonix.  Follow UP  Follow-up Information  [facility] EMERGENCY DEPARTMENT.   Specialty:  Emergency Medicine Why:  As needed, If symptoms worsen Contact information: [address] [id number] mc [place] [address] [phone]       [facility]. Schedule an appointment as soon as possible for a visit in 3 days.   Why:  Follow up with wellness Contact information: [address] [facility] [address] [phone]           Consults obtained: None  Discharge Condition: Fair  Diet and Activity recommendation: See Discharge Instructions below  Discharge Instructions    Diet - low sodium heart healthy   Complete by:  As directed    Discharge instructions   Complete by:  As directed    Please refrain from drinking alcohol.  Please set up care with a primary care doctor at the the community health and wellness clinic numbers provided below.  Provided you with a one-month prescription and you should establish care with a primary care provider to have these medications continued.   Increase activity slowly   Complete by:  As directed         Discharge Medications     Allergies as of 03/17/2018   No Known Allergies     Medication List    STOP taking these medications   GOODY HEADACHE PO   ipratropium-albuterol 0.5-2.5 (3) MG/3ML Soln Commonly known as:  DUONEB     ondansetron 4 MG disintegrating tablet Commonly known as:  ZOFRAN ODT   potassium chloride SA 20 MEQ tablet Commonly known as:  K-DUR,KLOR-CON     TAKE these medications   aluminum-magnesium hydroxide-simethicone 200-200-20 MG/5ML Susp Commonly known as:  MAALOX Take 30 mLs by mouth 4 (four) times daily as needed (indigestion).   azithromycin 250 MG tablet Commonly known as:  ZITHROMAX Take 1 tablet (250 mg total) by mouth daily. Take first 2 tablets together, then 1 every day until finished.   bismuth subsalicylate 262 MG chewable tablet Commonly known as:  PEPTO-BISMOL Chew 2 tablets (524 mg total) by mouth as needed for diarrhea or loose stools.   chlordiazePOXIDE 25 MG capsule Commonly known as:  LIBRIUM 50mg  PO TID x 1D, then 25-50mg  PO BID X 1D, then 25-50mg  PO QD X 1D   FLUoxetine 20 MG capsule Commonly known as:  PROZAC Take 20 mg by mouth daily.   folic acid 1 MG tablet Commonly known as:  FOLVITE Take 1 tablet (1 mg total) by mouth daily.   furosemide 40 MG tablet Commonly known as:  LASIX Take 1 tablet (40 mg total) by mouth daily.   gabapentin 100 MG capsule Commonly known as:  NEURONTIN Take 2 capsules (200 mg total) by mouth 3 (three) times daily. What changed:  how much to take   hydrocortisone cream 1 % Apply topically 3 (three) times daily. What changed:    how much to take  when to take this  reasons to take this   hydrOXYzine 25 MG tablet Commonly known as:  ATARAX/VISTARIL Take 1 tablet (25 mg total) by mouth daily as needed (anxiety).   lactulose 10 GM/15ML solution Commonly known as:  CHRONULAC Take 30 mLs (20 g total) by mouth 2 (two) times daily.   loperamide 2 MG tablet Commonly known as:  IMODIUM A-D Take 2 mg by mouth daily as needed for diarrhea or loose stools.   magnesium oxide 400 MG tablet Commonly known as:  MAG-OX Take 1 tablet (400  mg total) by mouth daily.   neomycin-polymyxin-hydrocortisone OTIC  solution Commonly known as:  CORTISPORIN Place 3 drops into both ears 3 (three) times daily for 10 days.   nicotine 14 mg/24hr patch Commonly known as:  NICODERM CQ - dosed in mg/24 hours Place 1 patch onto the skin daily.   pantoprazole 40 MG tablet Commonly known as:  PROTONIX Take 1 tablet (40 mg total) by mouth daily.   predniSONE 20 MG tablet Commonly known as:  DELTASONE Take 3 tablets (60 mg total) by mouth daily with breakfast for 4 days.   promethazine 25 MG tablet Commonly known as:  PHENERGAN Take 1 tablet (25 mg total) by mouth every 6 (six) hours as needed for nausea or vomiting.   rizatriptan 5 MG tablet Commonly known as:  MAXALT Take 1 tablet (5 mg total) by mouth every 2 (two) hours as needed for migraine.   spironolactone 25 MG tablet Commonly known as:  ALDACTONE Take 1 tablet (25 mg total) by mouth daily for 30 days.   thiamine 100 MG tablet Take 1 tablet (100 mg total) by mouth daily.   VENTOLIN HFA 108 (90 Base) MCG/ACT inhaler Generic drug:  albuterol Inhale 2 puffs into the lungs every 4 (four) hours as needed for wheezing or shortness of breath.   WIXELA INHUB 250-50 MCG/DOSE Aepb Generic drug:  Fluticasone-Salmeterol Inhale 1 puff into the lungs 2 (two) times daily. What changed:  Another medication with the same name was removed. Continue taking this medication, and follow the directions you see here.       Major procedures and Radiology Reports - PLEASE review detailed and final reports for all details, in brief -    Dg Chest 2 View  Result Date: 02/28/2018 CLINICAL DATA:  Coughing and vomiting. EXAM: CHEST - 2 VIEW COMPARISON:  02/23/2018. FINDINGS: Mediastinum and hilar structures normal. Heart size normal. Mild peribronchial cuffing. No focal infiltrate. No pleural effusion or pneumothorax. No acute bony abnormality. IMPRESSION: Mild peribronchial cuffing. Mild bronchitis could not be excluded. Exam is otherwise unremarkable.  Electronically Signed   By: Maisie Fus  Register   On: 02/28/2018 10:24   Dg Chest 2 View  Result Date: 02/23/2018 CLINICAL DATA:  ETOH addiction for detox EXAM: CHEST - 2 VIEW COMPARISON:  January 25, 2018 FINDINGS: The heart size and mediastinal contours are within normal limits. Mild scar is identified in the right lung base. There is no focal infiltrate, pulmonary edema, or pleural effusion. The visualized skeletal structures are stable. IMPRESSION: No active cardiopulmonary disease. Electronically Signed   By: Sherian Rein M.D.   On: 02/23/2018 11:45   Ct Head Wo Contrast  Result Date: 03/16/2018 CLINICAL DATA:  Altered level of consciousness. History of alcohol ingestion. Responsive to painful stimuli. EXAM: CT HEAD WITHOUT CONTRAST TECHNIQUE: Contiguous axial images were obtained from the base of the skull through the vertex without intravenous contrast. COMPARISON:  03/19/2017 FINDINGS: Motion artifact limits examination. Brain: Mild diffuse cerebral atrophy. Mild ventricular dilatation consistent with central atrophy. Patchy low-attenuation changes in the deep white matter likely due to small vessel ischemia. No mass-effect or midline shift. No abnormal extra-axial fluid collections. Gray-white matter junctions are distinct. Basal cisterns are not effaced. No acute intracranial hemorrhage. Vascular: No hyperdense vessel or unexpected calcification. Skull: Calvarium appears intact. No acute depressed skull fractures identified. Sinuses/Orbits: Visualized paranasal sinuses and mastoid air cells are clear. Visualization is severely limited due to motion artifact. Other: None. IMPRESSION: No acute intracranial abnormalities. Chronic atrophy and  small vessel ischemic changes. Electronically Signed   By: Burman Nieves M.D.   On: 03/16/2018 21:26   Ct Abdomen Pelvis W Contrast  Result Date: 02/28/2018 CLINICAL DATA:  Abdominal distension EXAM: CT ABDOMEN AND PELVIS WITH CONTRAST TECHNIQUE:  Multidetector CT imaging of the abdomen and pelvis was performed using the standard protocol following bolus administration of intravenous contrast. CONTRAST:  ISOVUE-300 IOPAMIDOL (ISOVUE-300) INJECTION 61% COMPARISON:  06/12/2017 FINDINGS: Lower chest: Lung bases are free of acute infiltrate or sizable effusion. Hepatobiliary: Heterogeneity of the liver is noted consistent with underlying cirrhosis. Small hyperdense lesion is noted in the lateral aspect of the left lobe of the liver best seen on image number 17 of series 2 consistent with small hemangioma. This is stable from the prior exam. Mild perihepatic fluid is noted. Pancreas: Unremarkable. No pancreatic ductal dilatation or surrounding inflammatory changes. Spleen: Spleen is prominent with mild perisplenic varices consistent with portal hypertension. Adrenals/Urinary Tract: Adrenal glands are unremarkable. Kidneys are normal, without renal calculi, focal lesion, or hydronephrosis. Bladder is unremarkable. Stomach/Bowel: Mild diverticular change of the colon is noted without evidence of diverticulitis. The colon is predominately decompressed. The appendix is air-filled and within normal limits. Small bowel shows no obstructive changes. The stomach is within normal limits. Scattered esophageal varices are noted distally. Vascular/Lymphatic: Changes consistent with portal hypertension are noted as described above. Aortic calcifications are noted without aneurysmal dilatation. Scattered likely reactive lymph nodes are noted adjacent to the celiac axis as well as in the portacaval space and periaortic region. Reproductive: Uterus and bilateral adnexa are unremarkable. Other: Free fluid is noted within the pelvis consistent with ascites and the underlying portal hypertension. Musculoskeletal: Mild degenerative changes of lumbar spine are noted. IMPRESSION: Changes consistent with cirrhosis of the liver with portal hypertension and variceal changes. Mild  ascites and splenomegaly are noted as well. Diverticulosis without diverticulitis. Electronically Signed   By: Alcide Clever M.D.   On: 02/28/2018 11:03   Dg Chest Portable 1 View  Result Date: 03/16/2018 CLINICAL DATA:  Poorly responsive after alcohol intake. EXAM: PORTABLE CHEST 1 VIEW COMPARISON:  Chest radiograph February 28, 2018 FINDINGS: Stable cardiomegaly. Interstitial prominence with pulmonary vascular congestion. No pleural effusion or focal consolidation. No pneumothorax. Old LEFT rib fractures. Breast implants. IMPRESSION: Stable cardiomegaly and pulmonary vascular congestion. Interstitial prominence seen with atypical infection and/or pulmonary edema. Electronically Signed   By: Awilda Metro M.D.   On: 03/16/2018 18:25   Vas [place] Lower Extremity Venous (dvt)  Result Date: 03/17/2018  Lower Venous Study Indications: Edema.  Performing Technologist: Levada Schilling RDMS, RVT  Examination Guidelines: A complete evaluation includes B-mode imaging, spectral Doppler, color Doppler, and power Doppler as needed of all accessible portions of each vessel. Bilateral testing is considered an integral part of a complete examination. Limited examinations for reoccurring indications may be performed as noted.  Right Venous Findings: +---------+---------------+---------+-----------+----------+-------+          CompressibilityPhasicitySpontaneityPropertiesSummary +---------+---------------+---------+-----------+----------+-------+ CFV      Full           Yes      Yes                          +---------+---------------+---------+-----------+----------+-------+ SFJ      Full                                                 +---------+---------------+---------+-----------+----------+-------+  FV Prox  Full                                                 +---------+---------------+---------+-----------+----------+-------+ FV Mid   Full                                                  +---------+---------------+---------+-----------+----------+-------+ FV DistalFull                                                 +---------+---------------+---------+-----------+----------+-------+ PFV      Full                                                 +---------+---------------+---------+-----------+----------+-------+ POP      Full           Yes      Yes                          +---------+---------------+---------+-----------+----------+-------+ PTV      Full                                                 +---------+---------------+---------+-----------+----------+-------+ PERO     Full                                                 +---------+---------------+---------+-----------+----------+-------+ GSV      Full                                                 +---------+---------------+---------+-----------+----------+-------+  Left Venous Findings: +---------+---------------+---------+-----------+----------+-------+          CompressibilityPhasicitySpontaneityPropertiesSummary +---------+---------------+---------+-----------+----------+-------+ CFV      Full           Yes      Yes                          +---------+---------------+---------+-----------+----------+-------+ SFJ      Full                                                 +---------+---------------+---------+-----------+----------+-------+ FV Prox  Full                                                 +---------+---------------+---------+-----------+----------+-------+  FV Mid   Full                                                 +---------+---------------+---------+-----------+----------+-------+ FV DistalFull                                                 +---------+---------------+---------+-----------+----------+-------+ PFV      Full                                                  +---------+---------------+---------+-----------+----------+-------+ POP      Full           Yes      Yes                          +---------+---------------+---------+-----------+----------+-------+ PTV      Full                                                 +---------+---------------+---------+-----------+----------+-------+ PERO     Full                                                 +---------+---------------+---------+-----------+----------+-------+ GSV      Full                                                 +---------+---------------+---------+-----------+----------+-------+    Summary: Right: There is no evidence of deep vein thrombosis in the lower extremity. Left: There is no evidence of deep vein thrombosis in the lower extremity. A cystic structure is found in the popliteal fossa.  *See table(s) above for measurements and observations. Electronically signed by Sherald Hess MD on 03/17/2018 at 3:39:56 PM.    Final     Micro Results     No results found for this or any previous visit (from the past 240 hour(s)).     Today   Subjective    Anne Black today states that she is ready to go home and if not discharged she will leave its medical advice.  She reports that she has not been able to get into certain rehab facilities. Objective   Blood pressure 123/67, pulse 88, temperature 98.1 F (36.7 C), temperature source Oral, resp. rate 20, SpO2 99 %.  No intake or output data in the 24 hours ending 03/18/18 1958  Exam  Constitutional: Female in no acute distress but appears anxious and currently trying to leave hospital Eyes: PERRL, lids and conjunctivae normal ENMT: Mucous membranes are moist. Posterior pharynx clear of any exudate or lesions. Neck: normal, supple, no masses, no thyromegaly Respiratory: clear to auscultation bilaterally, no wheezing, no crackles.  Normal respiratory effort. No accessory muscle use.  Cardiovascular: Regular  rate and rhythm, no murmurs / rubs / gallops.  1+ pitting lower extremity edema right leg worse than left. 2+ pedal pulses. No carotid bruits.  Abdomen: Abdominal distention, mild tenderness to palpation, no masses palpated. Bowel sounds positive.  Musculoskeletal: no clubbing / cyanosis. No joint deformity upper and lower extremities. Good ROM, no contractures. Normal muscle tone.  Skin: no rashes, lesions, ulcers. No induration Neurologic: CN 2-12 grossly intact. Sensation intact, DTR normal. Strength 5/5 in all 4.  Psychiatric: Poor judgment and insight. Alert and oriented x 3.  Anxious mood.    Data Review   CBC w Diff:  Lab Results  Component Value Date   WBC 4.0 03/17/2018   HGB 8.9 (L) 03/17/2018   HCT 30.0 (L) 03/17/2018   HCT 36.8 05/29/2015   PLT 99 (L) 03/17/2018   LYMPHOPCT 17 03/16/2018   BANDSPCT 11 01/13/2018   MONOPCT 9 03/16/2018   EOSPCT 1 03/16/2018   BASOPCT 1 03/16/2018    CMP:  Lab Results  Component Value Date   NA 139 03/17/2018   K 4.1 03/17/2018   CL 99 03/17/2018   CO2 28 03/17/2018   BUN 5 (L) 03/17/2018   CREATININE 0.46 03/17/2018   CREATININE 0.37 (L) 12/30/2015   PROT 6.6 03/16/2018   ALBUMIN 3.4 (L) 03/16/2018   BILITOT 1.0 03/16/2018   ALKPHOS 97 03/16/2018   AST 39 03/16/2018   ALT 20 03/16/2018  .   Total Time in preparing paper work, data evaluation and todays exam - 35 minutes  Clydie Braun M.D on 03/18/2018 at 7:58 PM  [facility]   Office  [phone]

## 2018-03-18 NOTE — ED Notes (Signed)
Bed: RESA Expected date:  Expected time:  Means of arrival:  Comments: EMS 58 yo female from home-found on ground with lac back of head-resp distress/mag/nebs x 2 solumedrol

## 2018-03-19 ENCOUNTER — Emergency Department (HOSPITAL_COMMUNITY): Payer: 59

## 2018-03-19 ENCOUNTER — Observation Stay (HOSPITAL_COMMUNITY): Payer: 59

## 2018-03-19 ENCOUNTER — Other Ambulatory Visit: Payer: Self-pay

## 2018-03-19 DIAGNOSIS — K7011 Alcoholic hepatitis with ascites: Secondary | ICD-10-CM

## 2018-03-19 DIAGNOSIS — S0231XA Fracture of orbital floor, right side, initial encounter for closed fracture: Secondary | ICD-10-CM

## 2018-03-19 DIAGNOSIS — J441 Chronic obstructive pulmonary disease with (acute) exacerbation: Principal | ICD-10-CM

## 2018-03-19 DIAGNOSIS — R55 Syncope and collapse: Secondary | ICD-10-CM

## 2018-03-19 DIAGNOSIS — S0285XA Fracture of orbit, unspecified, initial encounter for closed fracture: Secondary | ICD-10-CM | POA: Diagnosis present

## 2018-03-19 DIAGNOSIS — S0101XA Laceration without foreign body of scalp, initial encounter: Secondary | ICD-10-CM

## 2018-03-19 DIAGNOSIS — S0101XD Laceration without foreign body of scalp, subsequent encounter: Secondary | ICD-10-CM

## 2018-03-19 DIAGNOSIS — W19XXXA Unspecified fall, initial encounter: Secondary | ICD-10-CM | POA: Diagnosis present

## 2018-03-19 DIAGNOSIS — F1010 Alcohol abuse, uncomplicated: Secondary | ICD-10-CM

## 2018-03-19 DIAGNOSIS — E876 Hypokalemia: Secondary | ICD-10-CM

## 2018-03-19 DIAGNOSIS — J9602 Acute respiratory failure with hypercapnia: Secondary | ICD-10-CM

## 2018-03-19 DIAGNOSIS — R10819 Abdominal tenderness, unspecified site: Secondary | ICD-10-CM

## 2018-03-19 DIAGNOSIS — J9601 Acute respiratory failure with hypoxia: Secondary | ICD-10-CM

## 2018-03-19 DIAGNOSIS — R10817 Generalized abdominal tenderness: Secondary | ICD-10-CM

## 2018-03-19 LAB — BODY FLUID CELL COUNT WITH DIFFERENTIAL
Lymphs, Fluid: 88 %
Monocyte-Macrophage-Serous Fluid: 6 % — ABNORMAL LOW (ref 50–90)
Neutrophil Count, Fluid: 6 % (ref 0–25)
Total Nucleated Cell Count, Fluid: 336 cu mm (ref 0–1000)

## 2018-03-19 LAB — BASIC METABOLIC PANEL
ANION GAP: 11 (ref 5–15)
Anion gap: 7 (ref 5–15)
BUN: 10 mg/dL (ref 6–20)
BUN: 9 mg/dL (ref 6–20)
CO2: 31 mmol/L (ref 22–32)
CO2: 31 mmol/L (ref 22–32)
Calcium: 7.8 mg/dL — ABNORMAL LOW (ref 8.9–10.3)
Calcium: 7.9 mg/dL — ABNORMAL LOW (ref 8.9–10.3)
Chloride: 99 mmol/L (ref 98–111)
Chloride: 102 mmol/L (ref 98–111)
Creatinine, Ser: 0.4 mg/dL — ABNORMAL LOW (ref 0.44–1.00)
Creatinine, Ser: 0.38 mg/dL — ABNORMAL LOW (ref 0.44–1.00)
GFR calc Af Amer: >60 mL/min (ref >60)
GFR calc non Af Amer: >60 mL/min (ref >60)
GFR calc non Af Amer: >60 mL/min (ref >60)
Glucose, Bld: 112 mg/dL — ABNORMAL HIGH (ref 70–99)
Glucose, Bld: 119 mg/dL — ABNORMAL HIGH (ref 70–99)
Potassium: 3 mmol/L — ABNORMAL LOW (ref 3.5–5.1)
Potassium: 3.8 mmol/L (ref 3.5–5.1)
SODIUM: 140 mmol/L (ref 135–145)
Sodium: 141 mmol/L (ref 135–145)

## 2018-03-19 LAB — BLOOD GAS, ARTERIAL
Acid-Base Excess: 5.1 mmol/L — ABNORMAL HIGH (ref 0.0–2.0)
Bicarbonate: 31.2 mmol/L — ABNORMAL HIGH (ref 20.0–28.0)
Drawn by: 308601
O2 Content: 2 L/min
O2 Saturation: 98 %
PCO2 ART: 58.4 mmHg — AB (ref 32.0–48.0)
Patient temperature: 98.6
pH, Arterial: 7.348 — ABNORMAL LOW (ref 7.350–7.450)
pO2, Arterial: 109 mmHg — ABNORMAL HIGH (ref 83.0–108.0)

## 2018-03-19 LAB — CBC
HCT: 32.9 % — ABNORMAL LOW (ref 36.0–46.0)
HEMATOCRIT: 29.8 % — AB (ref 36.0–46.0)
HEMOGLOBIN: 9.6 g/dL — AB (ref 12.0–15.0)
Hemoglobin: 8.5 g/dL — ABNORMAL LOW (ref 12.0–15.0)
MCH: 26.2 pg (ref 26.0–34.0)
MCH: 25.8 pg — ABNORMAL LOW (ref 26.0–34.0)
MCHC: 29.2 g/dL — ABNORMAL LOW (ref 30.0–36.0)
MCHC: 28.5 g/dL — ABNORMAL LOW (ref 30.0–36.0)
MCV: 89.9 fL (ref 80.0–100.0)
MCV: 90.3 fL (ref 80.0–100.0)
PLATELETS: 93 10*3/uL — AB (ref 150–400)
Platelets: 113 10*3/uL — ABNORMAL LOW (ref 150–400)
RBC: 3.3 MIL/uL — ABNORMAL LOW (ref 3.87–5.11)
RBC: 3.66 MIL/uL — AB (ref 3.87–5.11)
RDW: 16 % — ABNORMAL HIGH (ref 11.5–15.5)
RDW: 15.9 % — AB (ref 11.5–15.5)
WBC: 5.3 10*3/uL (ref 4.0–10.5)
WBC: 3.8 10*3/uL — ABNORMAL LOW (ref 4.0–10.5)
nRBC: 0 % (ref 0.0–0.2)
nRBC: 0.5 % — ABNORMAL HIGH (ref 0.0–0.2)

## 2018-03-19 LAB — URINALYSIS, ROUTINE W REFLEX MICROSCOPIC
Bilirubin Urine: NEGATIVE
Glucose, UA: NEGATIVE mg/dL
Ketones, ur: NEGATIVE mg/dL
LEUKOCYTES UA: NEGATIVE
Nitrite: NEGATIVE
Protein, ur: NEGATIVE mg/dL
Specific Gravity, Urine: 1.003 — ABNORMAL LOW (ref 1.005–1.030)
pH: 6 (ref 5.0–8.0)

## 2018-03-19 LAB — HEPATIC FUNCTION PANEL
ALT: 21 U/L (ref 0–44)
AST: 35 U/L (ref 15–41)
Albumin: 3.6 g/dL (ref 3.5–5.0)
Alkaline Phosphatase: 94 U/L (ref 38–126)
BILIRUBIN DIRECT: 0.3 mg/dL — AB (ref 0.0–0.2)
BILIRUBIN INDIRECT: 0.6 mg/dL (ref 0.3–0.9)
Total Bilirubin: 0.9 mg/dL (ref 0.3–1.2)
Total Protein: 6.7 g/dL (ref 6.5–8.1)

## 2018-03-19 LAB — TROPONIN I: Troponin I: <0.03 ng/mL (ref <0.03)

## 2018-03-19 LAB — PROTEIN, PLEURAL OR PERITONEAL FLUID: Total protein, fluid: <3 g/dL

## 2018-03-19 LAB — AMMONIA: Ammonia: 62 umol/L — ABNORMAL HIGH (ref 9–35)

## 2018-03-19 LAB — RAPID URINE DRUG SCREEN, HOSP PERFORMED
Amphetamines: NOT DETECTED
Barbiturates: NOT DETECTED
Benzodiazepines: POSITIVE — AB
Cocaine: NOT DETECTED
Opiates: NOT DETECTED
Tetrahydrocannabinol: NOT DETECTED

## 2018-03-19 LAB — I-STAT BETA HCG BLOOD, ED (MC, WL, AP ONLY): I-stat hCG, quantitative: <5 m[IU]/mL (ref <5)

## 2018-03-19 LAB — ETHANOL: ALCOHOL ETHYL (B): 250 mg/dL — AB (ref <10)

## 2018-03-19 LAB — LACTATE DEHYDROGENASE: LDH: 160 U/L (ref 98–192)

## 2018-03-19 LAB — GLUCOSE, PLEURAL OR PERITONEAL FLUID: Glucose, Fluid: 168 mg/dL

## 2018-03-19 LAB — PARATHYROID HORMONE, INTACT (NO CA)

## 2018-03-19 LAB — MAGNESIUM: MAGNESIUM: 2 mg/dL (ref 1.7–2.4)

## 2018-03-19 MED ORDER — ACETAMINOPHEN 325 MG PO TABS
650.0000 mg | ORAL_TABLET | Freq: Four times a day (QID) | ORAL | Status: DC | PRN
  Administered 2018-03-20: 650 mg via ORAL
  Filled 2018-03-19: qty 2

## 2018-03-19 MED ORDER — ADULT MULTIVITAMIN W/MINERALS CH
1.0000 | ORAL_TABLET | Freq: Every day | ORAL | Status: DC
  Administered 2018-03-20: 1 via ORAL
  Filled 2018-03-19 (×2): qty 1

## 2018-03-19 MED ORDER — LOPERAMIDE HCL 2 MG PO CAPS: 2.0000 mg | ORAL_CAPSULE | ORAL | Status: DC | PRN

## 2018-03-19 MED ORDER — PANTOPRAZOLE SODIUM 40 MG PO TBEC
40.0000 mg | DELAYED_RELEASE_TABLET | Freq: Every day | ORAL | Status: DC
  Administered 2018-03-19 – 2018-03-20 (×2): 40 mg via ORAL
  Filled 2018-03-19 (×2): qty 1

## 2018-03-19 MED ORDER — NEOMYCIN-POLYMYXIN-HC 3.5-10000-1 OT SOLN: 3.0000 [drp] | Freq: Three times a day (TID) | OTIC | Status: DC

## 2018-03-19 MED ORDER — MAGNESIUM OXIDE 400 (241.3 MG) MG PO TABS
400.0000 mg | ORAL_TABLET | Freq: Every day | ORAL | Status: DC
  Administered 2018-03-19 – 2018-03-20 (×2): 400 mg via ORAL
  Filled 2018-03-19 (×2): qty 1

## 2018-03-19 MED ORDER — VITAMIN B-1 100 MG PO TABS
100.0000 mg | ORAL_TABLET | Freq: Every day | ORAL | Status: DC
  Administered 2018-03-20: 100 mg via ORAL
  Filled 2018-03-19 (×2): qty 1

## 2018-03-19 MED ORDER — CHLORDIAZEPOXIDE HCL 25 MG PO CAPS: 25.0000 mg | ORAL_CAPSULE | Freq: Every day | ORAL | Status: DC

## 2018-03-19 MED ORDER — ALBUMIN HUMAN 25 % IV SOLN
25.0000 g | Freq: Once | INTRAVENOUS | Status: AC
  Administered 2018-03-19: 25 g via INTRAVENOUS
  Filled 2018-03-19: qty 100

## 2018-03-19 MED ORDER — CHLORDIAZEPOXIDE HCL 25 MG PO CAPS
25.0000 mg | ORAL_CAPSULE | Freq: Four times a day (QID) | ORAL | Status: DC | PRN
  Administered 2018-03-20: 25 mg via ORAL
  Filled 2018-03-19: qty 1

## 2018-03-19 MED ORDER — FLUOXETINE HCL 20 MG PO CAPS
20.0000 mg | ORAL_CAPSULE | Freq: Every day | ORAL | Status: DC
  Administered 2018-03-19 – 2018-03-20 (×2): 20 mg via ORAL
  Filled 2018-03-19 (×2): qty 1

## 2018-03-19 MED ORDER — ADULT MULTIVITAMIN W/MINERALS CH: 1.0000 | ORAL_TABLET | Freq: Every day | ORAL | Status: DC

## 2018-03-19 MED ORDER — METHYLPREDNISOLONE SODIUM SUCC 125 MG IJ SOLR
60.0000 mg | Freq: Three times a day (TID) | INTRAMUSCULAR | Status: DC
  Administered 2018-03-19: 60 mg via INTRAVENOUS
  Filled 2018-03-19: qty 2

## 2018-03-19 MED ORDER — FOLIC ACID 1 MG PO TABS: 1.0000 mg | ORAL_TABLET | Freq: Every day | ORAL | Status: DC

## 2018-03-19 MED ORDER — HYDROCORTISONE 1 % EX CREA: 1.0000 "application " | TOPICAL_CREAM | Freq: Every day | CUTANEOUS | Status: DC | PRN

## 2018-03-19 MED ORDER — VITAMIN B-1 100 MG PO TABS: 100.0000 mg | ORAL_TABLET | Freq: Every day | ORAL | Status: DC

## 2018-03-19 MED ORDER — PROCHLORPERAZINE EDISYLATE 10 MG/2ML IJ SOLN
10.0000 mg | Freq: Four times a day (QID) | INTRAMUSCULAR | Status: DC | PRN
  Administered 2018-03-19: 10 mg via INTRAVENOUS
  Filled 2018-03-19: qty 2

## 2018-03-19 MED ORDER — LORAZEPAM 1 MG PO TABS: 1.0000 mg | ORAL_TABLET | Freq: Four times a day (QID) | ORAL | Status: DC | PRN

## 2018-03-19 MED ORDER — IPRATROPIUM-ALBUTEROL 0.5-2.5 (3) MG/3ML IN SOLN
3.0000 mL | Freq: Four times a day (QID) | RESPIRATORY_TRACT | Status: DC
  Administered 2018-03-19 (×2): 3 mL via RESPIRATORY_TRACT
  Filled 2018-03-19 (×2): qty 3

## 2018-03-19 MED ORDER — LACTULOSE 10 GM/15ML PO SOLN
20.0000 g | Freq: Two times a day (BID) | ORAL | Status: DC
  Administered 2018-03-19 – 2018-03-20 (×3): 20 g via ORAL
  Filled 2018-03-19 (×3): qty 30

## 2018-03-19 MED ORDER — LEVOFLOXACIN IN D5W 500 MG/100ML IV SOLN
500.0000 mg | INTRAVENOUS | Status: DC
  Administered 2018-03-19 – 2018-03-20 (×2): 500 mg via INTRAVENOUS
  Filled 2018-03-19 (×2): qty 100

## 2018-03-19 MED ORDER — THIAMINE HCL 100 MG/ML IJ SOLN
Freq: Once | INTRAVENOUS | Status: AC
  Administered 2018-03-19: 09:00:00 via INTRAVENOUS
  Filled 2018-03-19: qty 1000

## 2018-03-19 MED ORDER — CHLORDIAZEPOXIDE HCL 25 MG PO CAPS
25.0000 mg | ORAL_CAPSULE | Freq: Four times a day (QID) | ORAL | Status: AC
  Administered 2018-03-19 (×4): 25 mg via ORAL
  Filled 2018-03-19 (×4): qty 1

## 2018-03-19 MED ORDER — METHYLPREDNISOLONE SODIUM SUCC 125 MG IJ SOLR
125.0000 mg | Freq: Three times a day (TID) | INTRAMUSCULAR | Status: DC
  Administered 2018-03-19 – 2018-03-20 (×4): 125 mg via INTRAVENOUS
  Filled 2018-03-19 (×4): qty 2

## 2018-03-19 MED ORDER — NICOTINE 14 MG/24HR TD PT24
14.0000 mg | MEDICATED_PATCH | Freq: Every day | TRANSDERMAL | Status: DC
  Administered 2018-03-19: 14 mg via TRANSDERMAL
  Filled 2018-03-19: qty 1

## 2018-03-19 MED ORDER — ONDANSETRON 4 MG PO TBDP: 4.0000 mg | ORAL_TABLET | Freq: Four times a day (QID) | ORAL | Status: DC | PRN

## 2018-03-19 MED ORDER — LORAZEPAM 2 MG/ML IJ SOLN: 1.0000 mg | Freq: Four times a day (QID) | INTRAMUSCULAR | Status: DC | PRN

## 2018-03-19 MED ORDER — IPRATROPIUM-ALBUTEROL 0.5-2.5 (3) MG/3ML IN SOLN
3.0000 mL | Freq: Three times a day (TID) | RESPIRATORY_TRACT | Status: DC
  Administered 2018-03-20 (×2): 3 mL via RESPIRATORY_TRACT
  Filled 2018-03-19 (×3): qty 3

## 2018-03-19 MED ORDER — ACETAMINOPHEN 650 MG RE SUPP: 650.0000 mg | Freq: Four times a day (QID) | RECTAL | Status: DC | PRN

## 2018-03-19 MED ORDER — LORAZEPAM 2 MG/ML IJ SOLN: 0.0000 mg | Freq: Two times a day (BID) | INTRAMUSCULAR | Status: DC

## 2018-03-19 MED ORDER — HYDROXYZINE HCL 25 MG PO TABS
25.0000 mg | ORAL_TABLET | Freq: Four times a day (QID) | ORAL | Status: DC | PRN
  Administered 2018-03-20: 25 mg via ORAL
  Filled 2018-03-19: qty 1

## 2018-03-19 MED ORDER — POTASSIUM CHLORIDE 10 MEQ/100ML IV SOLN
10.0000 meq | INTRAVENOUS | Status: AC
  Administered 2018-03-19 (×5): 10 meq via INTRAVENOUS
  Filled 2018-03-19 (×4): qty 100

## 2018-03-19 MED ORDER — CHLORDIAZEPOXIDE HCL 25 MG PO CAPS
25.0000 mg | ORAL_CAPSULE | Freq: Three times a day (TID) | ORAL | Status: DC
  Administered 2018-03-20 (×2): 25 mg via ORAL
  Filled 2018-03-19 (×2): qty 1

## 2018-03-19 MED ORDER — SUMATRIPTAN SUCCINATE 50 MG PO TABS
50.0000 mg | ORAL_TABLET | ORAL | Status: DC | PRN
  Filled 2018-03-19: qty 1

## 2018-03-19 MED ORDER — THIAMINE HCL 100 MG/ML IJ SOLN: 100.0000 mg | Freq: Every day | INTRAMUSCULAR | Status: DC

## 2018-03-19 MED ORDER — ALBUTEROL SULFATE (2.5 MG/3ML) 0.083% IN NEBU: 2.5000 mg | INHALATION_SOLUTION | RESPIRATORY_TRACT | Status: DC | PRN

## 2018-03-19 MED ORDER — LORATADINE 10 MG PO TABS
10.0000 mg | ORAL_TABLET | Freq: Every day | ORAL | Status: DC
  Administered 2018-03-19 – 2018-03-20 (×2): 10 mg via ORAL
  Filled 2018-03-19 (×3): qty 1

## 2018-03-19 MED ORDER — LORAZEPAM 2 MG/ML IJ SOLN
2.0000 mg | Freq: Four times a day (QID) | INTRAMUSCULAR | Status: DC | PRN
  Administered 2018-03-19: 2 mg via INTRAVENOUS

## 2018-03-19 MED ORDER — LORAZEPAM 1 MG PO TABS: 2.0000 mg | ORAL_TABLET | Freq: Four times a day (QID) | ORAL | Status: DC | PRN

## 2018-03-19 MED ORDER — LORAZEPAM 2 MG/ML IJ SOLN
1.0000 mg | Freq: Four times a day (QID) | INTRAMUSCULAR | Status: DC | PRN
  Administered 2018-03-20 (×3): 1 mg via INTRAVENOUS
  Filled 2018-03-19 (×3): qty 1

## 2018-03-19 MED ORDER — BUDESONIDE 0.5 MG/2ML IN SUSP
0.5000 mg | Freq: Two times a day (BID) | RESPIRATORY_TRACT | Status: DC
  Administered 2018-03-19 – 2018-03-20 (×2): 0.5 mg via RESPIRATORY_TRACT
  Filled 2018-03-19 (×3): qty 2

## 2018-03-19 MED ORDER — CHLORDIAZEPOXIDE HCL 25 MG PO CAPS: 25.0000 mg | ORAL_CAPSULE | ORAL | Status: DC

## 2018-03-19 MED ORDER — ALBUTEROL SULFATE (2.5 MG/3ML) 0.083% IN NEBU
5.0000 mg | INHALATION_SOLUTION | Freq: Once | RESPIRATORY_TRACT | Status: AC
  Administered 2018-03-19: 5 mg via RESPIRATORY_TRACT
  Filled 2018-03-19: qty 6

## 2018-03-19 MED ORDER — SODIUM CHLORIDE 0.9 % IV SOLN
INTRAVENOUS | Status: DC
  Administered 2018-03-19 (×2): via INTRAVENOUS

## 2018-03-19 MED ORDER — PANTOPRAZOLE SODIUM 40 MG PO TBEC: 40.0000 mg | DELAYED_RELEASE_TABLET | Freq: Every day | ORAL | Status: DC

## 2018-03-19 MED ORDER — GABAPENTIN 300 MG PO CAPS
300.0000 mg | ORAL_CAPSULE | Freq: Three times a day (TID) | ORAL | Status: DC
  Administered 2018-03-19 – 2018-03-20 (×5): 300 mg via ORAL
  Filled 2018-03-19 (×4): qty 1

## 2018-03-19 MED ORDER — FOLIC ACID 1 MG PO TABS
1.0000 mg | ORAL_TABLET | Freq: Every day | ORAL | Status: DC
  Administered 2018-03-20: 1 mg via ORAL
  Filled 2018-03-19 (×2): qty 1

## 2018-03-19 MED ORDER — NICOTINE 21 MG/24HR TD PT24
21.0000 mg | MEDICATED_PATCH | Freq: Every day | TRANSDERMAL | Status: DC
  Administered 2018-03-20: 21 mg via TRANSDERMAL
  Filled 2018-03-19: qty 1

## 2018-03-19 MED ORDER — LORAZEPAM 2 MG/ML IJ SOLN
0.0000 mg | Freq: Four times a day (QID) | INTRAMUSCULAR | Status: DC
  Filled 2018-03-19: qty 1

## 2018-03-19 MED ORDER — IPRATROPIUM-ALBUTEROL 0.5-2.5 (3) MG/3ML IN SOLN: 3.0000 mL | RESPIRATORY_TRACT | Status: DC | PRN

## 2018-03-19 NOTE — ED Notes (Signed)
ED TO INPATIENT HANDOFF REPORT  Name/Age/Gender Anne Black 58 y.o. female  Code Status    Code Status Orders  (From admission, onward)         Start     Ordered   03/19/18 0634  Full code  Continuous     03/19/18 0635        Code Status History    Date Active Date Inactive Code Status Order ID Comments User Context   03/17/2018 0013 03/17/2018 2203 Full Code [id number]  Lorretta Harp, MD ED   02/18/2018 1755 02/20/2018 1846 Full Code [id number]  Calvert Cantor, MD ED   02/18/2018 1509 02/18/2018 1755 Full Code [id number]  Antoine Primas, MD ED   01/13/2018 1449 01/20/2018 2012 Full Code [id number]  Simonne Martinet, NP ED   11/17/2017 2255 11/18/2017 1516 Full Code [id number]  Garlon Hatchet, PA-C ED   08/21/2017 2157 08/23/2017 1534 Full Code [id number]  Ike Bene, MD ED   06/12/2017 1740 06/14/2017 1544 Full Code [id number]  Kathlen Mody, MD Inpatient   04/15/2017 0917 04/21/2017 1545 Full Code [id number]  Barnetta Chapel, MD Inpatient   03/14/2017 1743 03/15/2017 1622 Full Code [id number]  Samson Frederic, Cortni S, PA-C ED   03/07/2017 2038 03/11/2017 2121 Full Code [id number]  Haydee Monica, MD ED   01/17/2017 1736 01/18/2017 1823 Full Code [id number]  Arthor Captain, PA-C ED   12/30/2016 1233 01/11/2017 1818 Full Code [id number]  [name], MD Inpatient   12/30/2016 0101 12/30/2016 1233 Full Code [id number]  Clydie Braun, MD ED   12/25/2016 2057 12/26/2016 1554 Full Code [id number]  Little, Ambrose Finland, MD ED   12/09/2016 0513 12/09/2016 1250 Full Code [id number]  Devoria Albe, MD ED   09/09/2016 1827 09/11/2016 1949 Full Code [id number]  Briscoe Deutscher, MD ED   08/08/2016 0145 08/09/2016 1644 Full Code [id number]  Muthersbaugh, Dahlia Client, PA-C ED   01/22/2016 2359 02/06/2016 1652 Full Code [id number]  [name], MD Inpatient   12/18/2015 1717 12/19/2015 1439 Full Code [id number]  Pincus Large, DO Inpatient   11/04/2015 1557 11/05/2015 1941 Full Code [id number]  [name], MD  Inpatient   10/15/2015 1521 10/24/2015 2108 Full Code [id number]  Arvilla Market, DO Inpatient   08/22/2015 2112 08/30/2015 1745 Full Code [id number]  Hillary Bow, DO ED   07/31/2015 1544 08/04/2015 1647 Full Code [id number]  Charm Rings, NP Inpatient   07/31/2015 0041 07/31/2015 1544 Full Code [id number]  Antony Madura, PA-C ED   05/18/2015 1531 06/05/2015 1456 Full Code [id number]  Edsel Petrin, DO Inpatient   02/21/2015 0136 02/21/2015 1429 Full Code [id number]  Worthy Flank, NP Inpatient   02/20/2015 1737 02/21/2015 0136 Full Code [id number]  Benjiman Core, MD ED   11/12/2014 1616 11/16/2014 1727 Full Code [id number]  Earney Navy, NP Inpatient   11/12/2014 1606 11/12/2014 1616 Full Code [id number]  Earney Navy, NP Inpatient   11/12/2014 0141 11/12/2014 1605 Full Code [id number]  Elpidio Anis, PA-C ED   10/30/2014 2242 11/05/2014 1626 Full Code [id number]  Earney Navy, NP Inpatient   10/30/2014 0004 10/30/2014 2242 Full Code [id number]  Richardean Canal, MD ED   04/07/2013 2041 04/12/2013 1637 Full Code [id number]  Court Joy, PA-C Inpatient   04/06/2013 2313 04/07/2013 2041 Full Code [id number]  Richardean Canal, MD ED   11/01/2011 0423 11/02/2011 1345 Full Code [id number]  Hillary Bow., DO ED   09/26/2011 2215 09/28/2011 0145 Full  Code [id number]  Geoffery Lyons, MD ED   05/17/2011 0716 05/20/2011 2152 Full Code [id number]  Agustin Cree ED      Home/SNF/Other Home  Chief Complaint Resp. Distress  Level of Care/Admitting Diagnosis ED Disposition    ED Disposition Condition Comment   Admit  Hospital Area: [facility] [[id number]]  Level of Care: Telemetry [5]  Admit to tele based on following criteria: Eval of Syncope  Diagnosis: Syncope [206001]  Admitting Physician: John Giovanni [[id number]]  Attending Physician: John Giovanni [[id number]]  PT Class (Do Not Modify): Observation [104]  PT Acc Code (Do Not Modify): Observation [10022]        Medical History Past Medical History:  Diagnosis Date  . ADHD (attention deficit hyperactivity disorder)   . Alcoholism (HCC)   . Anxiety   . Arthritis   . Ascites   . Bipolar disorder (HCC)   . Cirrhosis (HCC)   . COPD (chronic obstructive pulmonary disease) (HCC)   . Hypokalemia   . Migraine   . Multiple sclerosis (HCC)   . Narcolepsy   . Osteoporosis     Allergies No Known Allergies  IV Location/Drains/Wounds Patient Lines/Drains/Airways Status   Active Line/Drains/Airways    Name:   Placement date:   Placement time:   Site:   Days:   Peripheral IV 03/18/18 Right Hand   03/18/18    -    Hand   1   Peripheral IV 03/18/18 Right Antecubital   03/18/18    2351    Antecubital   1   Incision (Closed) 04/15/17 Abdomen   04/15/17    0900     338   Wound / Incision (Open or Dehisced) 08/22/17 Non-pressure wound Abdomen Mid wound from prior hernia repair   08/22/17    0127    Abdomen   209          Labs/Imaging Results for orders placed or performed during the hospital encounter of 03/18/18 (from the past 48 hour(s))  Basic metabolic panel     Status: Abnormal   Collection Time: 03/18/18 11:38 PM  Result Value Ref Range   Sodium 141 135 - 145 mmol/L   Potassium 3.0 (L) 3.5 - 5.1 mmol/L   Chloride 99 98 - 111 mmol/L   CO2 31 22 - 32 mmol/L   Glucose, Bld 112 (H) 70 - 99 mg/dL   BUN 10 6 - 20 mg/dL   Creatinine, Ser 5.78 (L) 0.44 - 1.00 mg/dL   Calcium 7.8 (L) 8.9 - 10.3 mg/dL   GFR calc non Af Amer >60 >60 mL/min   GFR calc Af Amer >60 >60 mL/min   Anion gap 11 5 - 15    Comment: Performed at [facility], [address] [place], [place] [address]  CBC     Status: Abnormal   Collection Time: 03/18/18 11:38 PM  Result Value Ref Range   WBC 5.3 4.0 - 10.5 K/uL   RBC 3.66 (L) 3.87 - 5.11 MIL/uL   Hemoglobin 9.6 (L) 12.0 - 15.0 g/dL   HCT 95.2 (L) 84.1 - 32.4 %   MCV 89.9 80.0 - 100.0 fL   MCH 26.2 26.0 - 34.0 pg   MCHC 29.2 (L) 30.0 - 36.0  g/dL   RDW 40.1 (H) 02.7 - 25.3 %   Platelets 113 (L) 150 - 400 K/uL    Comment: REPEATED TO VERIFY PLATELET COUNT CONFIRMED BY SMEAR SPECIMEN CHECKED FOR CLOTS    nRBC 0.0  0.0 - 0.2 %    Comment: Performed at [facility], [address] [place], [place] [address]  Hepatic function panel     Status: Abnormal   Collection Time: 03/18/18 11:38 PM  Result Value Ref Range   Total Protein 6.7 6.5 - 8.1 g/dL   Albumin 3.6 3.5 - 5.0 g/dL   AST 35 15 - 41 U/L   ALT 21 0 - 44 U/L   Alkaline Phosphatase 94 38 - 126 U/L   Total Bilirubin 0.9 0.3 - 1.2 mg/dL   Bilirubin, Direct 0.3 (H) 0.0 - 0.2 mg/dL   Indirect Bilirubin 0.6 0.3 - 0.9 mg/dL    Comment: Performed at [facility], [address] [place], [place] [address]  Ethanol     Status: Abnormal   Collection Time: 03/18/18 11:38 PM  Result Value Ref Range   Alcohol, Ethyl (B) 250 (H) <10 mg/dL    Comment: (NOTE) Lowest detectable limit for serum alcohol is 10 mg/dL. For medical purposes only. Performed at [facility], [address] [place], [place] [address]   CBG monitoring, ED     Status: Abnormal   Collection Time: 03/18/18 11:41 PM  Result Value Ref Range   Glucose-Capillary 112 (H) 70 - 99 mg/dL  Blood gas, arterial ([facility] & [facility] ONLY)     Status: Abnormal   Collection Time: 03/18/18 11:43 PM  Result Value Ref Range   O2 Content 2.0 L/min   Delivery systems NASAL CANNULA    pH, Arterial 7.348 (L) 7.350 - 7.450   pCO2 arterial 58.4 (H) 32.0 - 48.0 mmHg   pO2, Arterial 109 (H) 83.0 - 108.0 mmHg   Bicarbonate 31.2 (H) 20.0 - 28.0 mmol/L   Acid-Base Excess 5.1 (H) 0.0 - 2.0 mmol/L   O2 Saturation 98.0 %   Patient temperature 98.6    Collection site LEFT RADIAL    Drawn by [id number]    Sample type ARTERIAL DRAW    Allens test (pass/fail) PASS PASS    Comment: Performed at [facility], [address] [place], [place] [address]  I-Stat beta hCG  blood, ED     Status: None   Collection Time: 03/18/18 11:56 PM  Result Value Ref Range   I-stat hCG, quantitative <5.0 <5 mIU/mL   Comment 3            Comment:   GEST. AGE      CONC.  (mIU/mL)   <=1 WEEK        5 - 50     2 WEEKS       50 - 500     3 WEEKS       100 - 10,000     4 WEEKS     1,000 - 30,000        FEMALE AND NON-PREGNANT FEMALE:     LESS THAN 5 mIU/mL   Urinalysis, Routine w reflex microscopic     Status: Abnormal   Collection Time: 03/19/18  1:00 AM  Result Value Ref Range   Color, Urine STRAW (A) YELLOW   APPearance CLEAR CLEAR   Specific Gravity, Urine 1.003 (L) 1.005 - 1.030   pH 6.0 5.0 - 8.0   Glucose, UA NEGATIVE NEGATIVE mg/dL   Hgb urine dipstick SMALL (A) NEGATIVE   Bilirubin Urine NEGATIVE NEGATIVE   Ketones, ur NEGATIVE NEGATIVE mg/dL   Protein, ur NEGATIVE NEGATIVE mg/dL   Nitrite NEGATIVE NEGATIVE   Leukocytes,  UA NEGATIVE NEGATIVE   WBC, UA 0-5 0 - 5 WBC/hpf   Bacteria, UA RARE (A) NONE SEEN   Squamous Epithelial / LPF 0-5 0 - 5    Comment: Performed at [facility], [address] [place], [place] [address]  Rapid urine drug screen (hospital performed)     Status: Abnormal   Collection Time: 03/19/18  1:00 AM  Result Value Ref Range   Opiates NONE DETECTED NONE DETECTED   Cocaine NONE DETECTED NONE DETECTED   Benzodiazepines POSITIVE (A) NONE DETECTED   Amphetamines NONE DETECTED NONE DETECTED   Tetrahydrocannabinol NONE DETECTED NONE DETECTED   Barbiturates NONE DETECTED NONE DETECTED    Comment: (NOTE) DRUG SCREEN FOR MEDICAL PURPOSES ONLY.  IF CONFIRMATION IS NEEDED FOR ANY PURPOSE, NOTIFY LAB WITHIN 5 DAYS. LOWEST DETECTABLE LIMITS FOR URINE DRUG SCREEN Drug Class                     Cutoff (ng/mL) Amphetamine and metabolites    1000 Barbiturate and metabolites    200 Benzodiazepine                 200 Tricyclics and metabolites     300 Opiates and metabolites        300 Cocaine and metabolites         300 THC                            50 Performed at [facility], [address] [place], [place] [address]    Dg Chest 2 View  Result Date: 03/19/2018 CLINICAL DATA:  Found on ground. EXAM: CHEST - 2 VIEW COMPARISON:  03/16/2018 FINDINGS: The heart is upper limits of normal in size given the AP projection and portable technique. No change since prior study. The lungs are clear except for streaky basilar atelectasis. No pleural effusion or pneumothorax. The bony thorax is intact. IMPRESSION: Streaky basilar atelectasis but no infiltrates, edema or effusions. Electronically Signed   By: Rudie Meyer M.D.   On: 03/19/2018 05:45   Ct Head Wo Contrast  Result Date: 03/18/2018 CLINICAL DATA:  Syncopal episode. Posterior head laceration. Found down on floor. Multiple sclerosis. EXAM: CT HEAD WITHOUT CONTRAST TECHNIQUE: Contiguous axial images were obtained from the base of the skull through the vertex without intravenous contrast. COMPARISON:  03/16/2018 head CT. FINDINGS: Brain: Nonspecific moderate subcortical and periventricular white matter hypodensity, stable. No evidence of parenchymal hemorrhage or extra-axial fluid collection. No mass lesion, mass effect, or midline shift. No CT evidence of acute infarction. Cerebral volume is age appropriate. No ventriculomegaly. Vascular: No acute abnormality. Skull: No evidence of calvarial fracture. Sinuses/Orbits: There is an acute fracture of right orbital floor with near complete opacification of the right maxillary sinus. No evidence of herniated right orbital muscle. Soft tissue emphysema is noted in. Septal inferior right orbit. Other: Small scalp laceration with associated minimal scalp emphysema in the medial left parietal scalp. The mastoid air cells are unopacified. IMPRESSION: 1. Small scalp laceration with associated minimal scalp emphysema in the medial left parietal scalp. 2. No evidence of acute intracranial abnormality. No evidence  of calvarial fracture. 3. Acute fracture of the right orbital floor with near complete opacification of the right maxillary sinus. Soft tissue emphysema in the inferior right orbit. 4. Chronic moderate white matter low-attenuation foci compatible with the provided clinical history of multiple sclerosis. Electronically Signed   By:  Delbert Phenix M.D.   On: 03/18/2018 23:34   Ct Cervical Spine Wo Contrast  Result Date: 03/19/2018 CLINICAL DATA:  58 y/o F; found on floor with laceration to head and injury to right cheek bone. EXAM: CT MAXILLOFACIAL WITHOUT CONTRAST CT CERVICAL SPINE WITHOUT CONTRAST TECHNIQUE: Multidetector CT imaging of the maxillofacial structures was performed. Multiplanar CT image reconstructions were also generated. A small metallic BB was placed on the right temple in order to reliably differentiate right from left. Multidetector CT imaging of the cervical spine was performed without intravenous contrast. Multiplanar CT image reconstructions were also generated. COMPARISON:  03/18/2018 CT head. FINDINGS: CT MAXILLOFACIAL FINDINGS Osseous: Multiple levels of motion artifact through the face. Acute fracture of the right floor of orbit measuring 12 x 10 mm (AP by ML series 16, image 30 and series 15, image 29) with 6 mm inferior displacement into the right maxillary sinus. No additional displaced facial fracture or mandibular dislocation identified. Orbits: Small amount of emphysema is present within the right inferior palpebral as well as the extraconal fat of the right inferior orbit. No orbital or retro-orbital hematoma. No significant proptosis. Extraocular muscles are intact without findings of entrapment. Normal left orbit. Sinuses: Right maxillary sinus blood fluid level. Additional visible paranasal sinuses are normally aerated. Under pneumatization of the mastoid air cells. Pneumatized mastoid air cells are normally aerated. Soft tissues: Contusion of right facial and periorbital soft  tissues. Limited intracranial: Please refer to same-day CT head. CT CERVICAL FINDINGS Alignment: Normal. Skull base and vertebrae: Motion artifact at the C2 and C6 levels. No acute fracture identified. No primary bone lesion or focal pathologic process. Soft tissues and spinal canal: No prevertebral fluid or swelling. No visible canal hematoma. Disc levels: Mild multilevel discogenic degenerative changes with small endplate marginal osteophytes. No significant bony foraminal or spinal canal stenosis. Upper chest: Negative. Other: Negative. IMPRESSION: Motion degraded study. 1. Acute fracture of the right floor of orbit measuring up to 12 mm with 6 mm inferior displacement into the right maxillary sinus. Mild infraorbital extraconal pneumatosis. No orbital or retro-orbital hematoma. No significant proptosis. No muscle entrapment. 2. Right maxillary sinus blood fluid level. 3. No additional displaced fracture or dislocation of the facial bones. 4. Contusion of right facial and periorbital soft tissues. 5. No acute fracture or dislocation of the cervical spine. 6. Mild cervical spine degenerative changes. No significant bony foraminal or spinal canal stenosis. Electronically Signed   By: Mitzi Hansen M.D.   On: 03/19/2018 00:38   Vas [place] Lower Extremity Venous (dvt)  Result Date: 03/17/2018  Lower Venous Study Indications: Edema.  Performing Technologist: Levada Schilling RDMS, RVT  Examination Guidelines: A complete evaluation includes B-mode imaging, spectral Doppler, color Doppler, and power Doppler as needed of all accessible portions of each vessel. Bilateral testing is considered an integral part of a complete examination. Limited examinations for reoccurring indications may be performed as noted.  Right Venous Findings: +---------+---------------+---------+-----------+----------+-------+          CompressibilityPhasicitySpontaneityPropertiesSummary  +---------+---------------+---------+-----------+----------+-------+ CFV      Full           Yes      Yes                          +---------+---------------+---------+-----------+----------+-------+ SFJ      Full                                                 +---------+---------------+---------+-----------+----------+-------+  FV Prox  Full                                                 +---------+---------------+---------+-----------+----------+-------+ FV Mid   Full                                                 +---------+---------------+---------+-----------+----------+-------+ FV DistalFull                                                 +---------+---------------+---------+-----------+----------+-------+ PFV      Full                                                 +---------+---------------+---------+-----------+----------+-------+ POP      Full           Yes      Yes                          +---------+---------------+---------+-----------+----------+-------+ PTV      Full                                                 +---------+---------------+---------+-----------+----------+-------+ PERO     Full                                                 +---------+---------------+---------+-----------+----------+-------+ GSV      Full                                                 +---------+---------------+---------+-----------+----------+-------+  Left Venous Findings: +---------+---------------+---------+-----------+----------+-------+          CompressibilityPhasicitySpontaneityPropertiesSummary +---------+---------------+---------+-----------+----------+-------+ CFV      Full           Yes      Yes                          +---------+---------------+---------+-----------+----------+-------+ SFJ      Full                                                  +---------+---------------+---------+-----------+----------+-------+ FV Prox  Full                                                 +---------+---------------+---------+-----------+----------+-------+  FV Mid   Full                                                 +---------+---------------+---------+-----------+----------+-------+ FV DistalFull                                                 +---------+---------------+---------+-----------+----------+-------+ PFV      Full                                                 +---------+---------------+---------+-----------+----------+-------+ POP      Full           Yes      Yes                          +---------+---------------+---------+-----------+----------+-------+ PTV      Full                                                 +---------+---------------+---------+-----------+----------+-------+ PERO     Full                                                 +---------+---------------+---------+-----------+----------+-------+ GSV      Full                                                 +---------+---------------+---------+-----------+----------+-------+    Summary: Right: There is no evidence of deep vein thrombosis in the lower extremity. Left: There is no evidence of deep vein thrombosis in the lower extremity. A cystic structure is found in the popliteal fossa.  *See table(s) above for measurements and observations. Electronically signed by Sherald Hess MD on 03/17/2018 at 3:39:56 PM.    Final    Ct Maxillofacial Wo Contrast  Result Date: 03/19/2018 CLINICAL DATA:  57 y/o F; found on floor with laceration to head and injury to right cheek bone. EXAM: CT MAXILLOFACIAL WITHOUT CONTRAST CT CERVICAL SPINE WITHOUT CONTRAST TECHNIQUE: Multidetector CT imaging of the maxillofacial structures was performed. Multiplanar CT image reconstructions were also generated. A small metallic BB was placed on the right temple  in order to reliably differentiate right from left. Multidetector CT imaging of the cervical spine was performed without intravenous contrast. Multiplanar CT image reconstructions were also generated. COMPARISON:  03/18/2018 CT head. FINDINGS: CT MAXILLOFACIAL FINDINGS Osseous: Multiple levels of motion artifact through the face. Acute fracture of the right floor of orbit measuring 12 x 10 mm (AP by ML series 16, image 30 and series 15, image 29) with 6 mm inferior displacement into the right maxillary sinus. No additional displaced facial fracture or mandibular dislocation identified. Orbits: Small amount  of emphysema is present within the right inferior palpebral as well as the extraconal fat of the right inferior orbit. No orbital or retro-orbital hematoma. No significant proptosis. Extraocular muscles are intact without findings of entrapment. Normal left orbit. Sinuses: Right maxillary sinus blood fluid level. Additional visible paranasal sinuses are normally aerated. Under pneumatization of the mastoid air cells. Pneumatized mastoid air cells are normally aerated. Soft tissues: Contusion of right facial and periorbital soft tissues. Limited intracranial: Please refer to same-day CT head. CT CERVICAL FINDINGS Alignment: Normal. Skull base and vertebrae: Motion artifact at the C2 and C6 levels. No acute fracture identified. No primary bone lesion or focal pathologic process. Soft tissues and spinal canal: No prevertebral fluid or swelling. No visible canal hematoma. Disc levels: Mild multilevel discogenic degenerative changes with small endplate marginal osteophytes. No significant bony foraminal or spinal canal stenosis. Upper chest: Negative. Other: Negative. IMPRESSION: Motion degraded study. 1. Acute fracture of the right floor of orbit measuring up to 12 mm with 6 mm inferior displacement into the right maxillary sinus. Mild infraorbital extraconal pneumatosis. No orbital or retro-orbital hematoma. No  significant proptosis. No muscle entrapment. 2. Right maxillary sinus blood fluid level. 3. No additional displaced fracture or dislocation of the facial bones. 4. Contusion of right facial and periorbital soft tissues. 5. No acute fracture or dislocation of the cervical spine. 6. Mild cervical spine degenerative changes. No significant bony foraminal or spinal canal stenosis. Electronically Signed   By: Mitzi HansenLance  Furusawa-Stratton M.D.   On: 03/19/2018 00:38    Pending Labs Unresulted Labs (From admission, onward)    Start     Ordered   03/19/18 1200  Basic metabolic panel  Once,   R     40/98/1100/10/28 0635   03/19/18 0631  Magnesium  Once,   R     03/19/18 0635   03/19/18 0631  Calcium, ionized  Once,   R     03/19/18 0635   03/19/18 0631  Parathyroid hormone, intact (no Ca)  Once,   R     03/19/18 0635   03/19/18 0631  VITAMIN D 25 Hydroxy (Vit-D Deficiency, Fractures)  Once,   R     03/19/18 0635   03/19/18 0628  Troponin I - Once  Once,   R     03/19/18 0635          Vitals/Pain Today's Vitals   03/19/18 0400 03/19/18 0427 03/19/18 0430 03/19/18 0636  BP: 122/67 122/67 117/73 (!) 124/59  Pulse: 87 86 85 91  Resp: 18 16 15 17   Temp:      TempSrc:      SpO2: 96% 97% 97% 100%    Isolation Precautions No active isolations  Medications Medications  methylPREDNISolone sodium succinate (SOLU-MEDROL) 125 mg/2 mL injection 60 mg (has no administration in time range)  ipratropium-albuterol (DUONEB) 0.5-2.5 (3) MG/3ML nebulizer solution 3 mL (has no administration in time range)  albuterol (PROVENTIL) (2.5 MG/3ML) 0.083% nebulizer solution 2.5 mg (has no administration in time range)  0.9 %  sodium chloride infusion (has no administration in time range)  potassium chloride 10 mEq in 100 mL IVPB (has no administration in time range)  thiamine (VITAMIN B-1) tablet 100 mg (has no administration in time range)    Or  thiamine (B-1) injection 100 mg (has no administration in time range)   folic acid (FOLVITE) tablet 1 mg (has no administration in time range)  multivitamin with minerals tablet 1 tablet (has no administration in  time range)  acetaminophen (TYLENOL) tablet 650 mg (has no administration in time range)    Or  acetaminophen (TYLENOL) suppository 650 mg (has no administration in time range)  sodium chloride flush (NS) 0.9 % injection 3 mL (3 mLs Intravenous Given 03/19/18 0000)  albuterol (PROVENTIL) (2.5 MG/3ML) 0.083% nebulizer solution 5 mg (5 mg Nebulization Given 03/19/18 0017)  Tdap (BOOSTRIX) injection 0.5 mL (0.5 mLs Intramuscular Given 03/19/18 0626)  albuterol (PROVENTIL) (2.5 MG/3ML) 0.083% nebulizer solution 5 mg (5 mg Nebulization Given 03/19/18 0626)    Mobility walks with device

## 2018-03-19 NOTE — ED Notes (Signed)
[place] on the way for the pt.

## 2018-03-19 NOTE — ED Notes (Signed)
Dr. Elesa Massed at bedside and placed 3 staple back of patient's head to just below occipital area

## 2018-03-19 NOTE — H&P (Addendum)
History and Physical    [name]:[id number] DOB: 1961-01-24 DOA: 03/18/2018  PCP: System, Pcp Not In Patient coming from: Home  Chief Complaint: Syncope/fall  HPI: Anne Black is a 58 y.o. female with medical history significant of ADHD, alcohol abuse, anxiety, bipolar disorder, COPD, cirrhosis, MS presenting to the hospital via EMS after being found on the floor by family with laceration to the back of her head and injury to her right cheekbone.  Noted to be wheezing by EMS and received magnesium 2 g, Solu-Medrol 125 mg, albuterol 10 mg, and Atrovent 1 mg.  Patient is very somnolent at this time and it is difficult to obtain a thorough history from her.  She remembers falling at home but is not able to give me any details.  Reports having shortness of breath and wheezing since her recent hospital discharge.  Reports having minimal nonproductive cough.  States she drank several glasses of wine yesterday and reports drinking on a regular basis.  No additional history could be obtained from the patient.  Review of Systems: As per HPI otherwise 10 point review of systems negative.  Past Medical History:  Diagnosis Date  . ADHD (attention deficit hyperactivity disorder)   . Alcoholism (HCC)   . Anxiety   . Arthritis   . Ascites   . Bipolar disorder (HCC)   . Cirrhosis (HCC)   . COPD (chronic obstructive pulmonary disease) (HCC)   . Hypokalemia   . Migraine   . Multiple sclerosis (HCC)   . Narcolepsy   . Osteoporosis     Past Surgical History:  Procedure Laterality Date  . ABDOMINAL WALL DEFECT REPAIR N/A 12/30/2016   Procedure: EXPLORATORY LAPAROTOMY WITH REPAIR ABDOMINAL WALL VENTRAL HERNIA;  Surgeon: [name], MD;  Location: [facility] OR;  Service: General;  Laterality: N/A;  . APPLICATION OF WOUND VAC N/A 12/30/2016   Procedure: APPLICATION OF WOUND VAC;  Surgeon: [name], MD;  Location: [facility] OR;  Service: General;  Laterality: N/A;  . CESAREAN SECTION   [phone]  . FRACTURE SURGERY    . HERNIA REPAIR    . MYRINGOTOMY WITH TUBE PLACEMENT Bilateral   . ORIF WRIST FRACTURE Left 09/09/2016   Procedure: OPEN REDUCTION INTERNAL FIXATION (ORIF) LEFT WRIST FRACTURE, LEFT CARPAL TUNNEL RELEASE;  Surgeon: Dominica Severin, MD;  Location: [facility] ORS;  Service: Orthopedics;  Laterality: Left;  . TONSILLECTOMY       reports that she has been smoking cigarettes. She has a 35.00 pack-year smoking history. She has never used smokeless tobacco. She reports current alcohol use. She reports that she does not use drugs.  No Known Allergies  Family History  Problem Relation Age of Onset  . Arrhythmia Mother   . Heart disease Father   . Hypertension Father     Prior to Admission medications   Medication Sig Start Date End Date Taking? Authorizing Provider  aluminum-magnesium hydroxide-simethicone (MAALOX) 200-200-20 MG/5ML SUSP Take 30 mLs by mouth 4 (four) times daily as needed (indigestion).     [provider]  azithromycin (ZITHROMAX) 250 MG tablet Take 1 tablet (250 mg total) by mouth daily. Take first 2 tablets together, then 1 every day until finished. 03/16/18   Curatolo, Adam, DO  bismuth subsalicylate (PEPTO-BISMOL) 262 MG chewable tablet Chew 2 tablets (524 mg total) by mouth as needed for diarrhea or loose stools. Patient not taking: Reported on 03/17/2018 07/08/17   Molpus, John, MD  chlordiazePOXIDE (LIBRIUM) 25 MG capsule 50mg  PO TID x 1D,  then 25-50mg  PO BID X 1D, then 25-50mg  PO QD X 1D 03/17/18   Madelyn Flavors A, MD  FLUoxetine (PROZAC) 20 MG capsule Take 20 mg by mouth daily. 12/31/17   [provider]  Fluticasone-Salmeterol (WIXELA INHUB) 250-50 MCG/DOSE AEPB Inhale 1 puff into the lungs 2 (two) times daily.    [provider]  folic acid (FOLVITE) 1 MG tablet Take 1 tablet (1 mg total) by mouth daily. 03/17/18   Clydie Braun, MD  furosemide (LASIX) 40 MG tablet Take 1 tablet (40 mg total) by mouth daily. 03/17/18    Clydie Braun, MD  gabapentin (NEURONTIN) 100 MG capsule Take 2 capsules (200 mg total) by mouth 3 (three) times daily. Patient taking differently: Take 300 mg by mouth 3 (three) times daily.  02/28/18   Charlynne Pander, MD  hydrocortisone cream 1 % Apply topically 3 (three) times daily. Patient taking differently: Apply 1 application topically daily as needed for itching.  04/20/17   Marguerita Merles Latif, DO  hydrOXYzine (ATARAX/VISTARIL) 25 MG tablet Take 1 tablet (25 mg total) by mouth daily as needed (anxiety). 03/17/18   Clydie Braun, MD  lactulose (CHRONULAC) 10 GM/15ML solution Take 30 mLs (20 g total) by mouth 2 (two) times daily. 03/17/18   Clydie Braun, MD  loperamide (IMODIUM A-D) 2 MG tablet Take 2 mg by mouth daily as needed for diarrhea or loose stools.     [provider]  magnesium oxide (MAG-OX) 400 MG tablet Take 1 tablet (400 mg total) by mouth daily. Patient not taking: Reported on 03/17/2018 02/23/18   Jacalyn Lefevre, MD  neomycin-polymyxin-hydrocortisone (CORTISPORIN) OTIC solution Place 3 drops into both ears 3 (three) times daily for 10 days. 03/17/18 03/27/18  Madelyn Flavors A, MD  nicotine (NICODERM CQ - DOSED IN MG/24 HOURS) 14 mg/24hr patch Place 1 patch onto the skin daily. 12/19/17   [provider]  pantoprazole (PROTONIX) 40 MG tablet Take 1 tablet (40 mg total) by mouth daily. 03/16/18   Curatolo, Adam, DO  predniSONE (DELTASONE) 20 MG tablet Take 3 tablets (60 mg total) by mouth daily with breakfast for 4 days. 03/16/18 03/20/18  Curatolo, Adam, DO  promethazine (PHENERGAN) 25 MG tablet Take 1 tablet (25 mg total) by mouth every 6 (six) hours as needed for nausea or vomiting. 03/17/18   Clydie Braun, MD  rizatriptan (MAXALT) 5 MG tablet Take 1 tablet (5 mg total) by mouth every 2 (two) hours as needed for migraine. 03/17/18   Clydie Braun, MD  spironolactone (ALDACTONE) 25 MG tablet Take 1 tablet (25 mg total) by mouth daily for 30 days. 03/17/18  04/16/18  Clydie Braun, MD  thiamine 100 MG tablet Take 1 tablet (100 mg total) by mouth daily. 03/17/18   Clydie Braun, MD  VENTOLIN HFA 108 (90 Base) MCG/ACT inhaler Inhale 2 puffs into the lungs every 4 (four) hours as needed for wheezing or shortness of breath. 03/16/18   Virgina Norfolk, DO    Physical Exam: Vitals:   03/19/18 0400 03/19/18 0427 03/19/18 0430 03/19/18 0636  BP: 122/67 122/67 117/73 (!) 124/59  Pulse: 87 86 85 91  Resp: 18 16 15 17   Temp:      TempSrc:      SpO2: 96% 97% 97% 100%    Physical Exam  Constitutional: She appears well-developed and well-nourished.  HENT:  Occipital laceration -3 staples in place Right periorbital bruising  Eyes: Pupils are equal, round, and  reactive to light.  Neck: Neck supple.  Cardiovascular: Normal rate, regular rhythm and intact distal pulses.  Pulmonary/Chest: Effort normal and breath sounds normal. No respiratory distress. She has no wheezes. She has no rales.  Abdominal: Soft. Bowel sounds are normal. She exhibits no distension. There is no abdominal tenderness. There is no guarding.  Musculoskeletal:        General: Edema present.     Comments: +1 edema of bilateral lower extremities  Neurological:  Very somnolent Waking up intermittently to answer questions and follow commands Oriented to person and place.  She knows it is February 2020.  Skin: Skin is warm and dry. She is not diaphoretic.     Labs on Admission: I have personally reviewed following labs and imaging studies  CBC: Recent Labs  Lab 03/16/18 1805 03/17/18 0647 03/18/18 2338  WBC 4.2 4.0 5.3  NEUTROABS 3.0  --   --   HGB 9.5* 8.9* 9.6*  HCT 32.9* 30.0* 32.9*  MCV 88.4 86.7 89.9  PLT 130* 99* 113*   Basic Metabolic Panel: Recent Labs  Lab 03/16/18 1805 03/17/18 0647 03/18/18 2338  NA 139 139 141  K 3.9 4.1 3.0*  CL 100 99 99  CO2 28 28 31   GLUCOSE 108* 134* 112*  BUN 6 5* 10  CREATININE 0.47 0.46 0.40*  CALCIUM 8.1* 8.2* 7.8*    GFR: CrCl cannot be calculated (Unknown ideal weight.). Liver Function Tests: Recent Labs  Lab 03/16/18 1941 03/18/18 2338  AST 39 35  ALT 20 21  ALKPHOS 97 94  BILITOT 1.0 0.9  PROT 6.6 6.7  ALBUMIN 3.4* 3.6   Recent Labs  Lab 03/16/18 1941  LIPASE 20   Recent Labs  Lab 03/16/18 1941  AMMONIA 40*   Coagulation Profile: No results for input(s): INR, PROTIME in the last 168 hours. Cardiac Enzymes: No results for input(s): CKTOTAL, CKMB, CKMBINDEX, TROPONINI in the last 168 hours. BNP (last 3 results) No results for input(s): PROBNP in the last 8760 hours. HbA1C: No results for input(s): HGBA1C in the last 72 hours. CBG: Recent Labs  Lab 03/18/18 2341  GLUCAP 112*   Lipid Profile: No results for input(s): CHOL, HDL, LDLCALC, TRIG, CHOLHDL, LDLDIRECT in the last 72 hours. Thyroid Function Tests: No results for input(s): TSH, T4TOTAL, FREET4, T3FREE, THYROIDAB in the last 72 hours. Anemia Panel: No results for input(s): VITAMINB12, FOLATE, FERRITIN, TIBC, IRON, RETICCTPCT in the last 72 hours. Urine analysis:    Component Value Date/Time   COLORURINE STRAW (A) 03/19/2018 0100   APPEARANCEUR CLEAR 03/19/2018 0100   LABSPEC 1.003 (L) 03/19/2018 0100   PHURINE 6.0 03/19/2018 0100   GLUCOSEU NEGATIVE 03/19/2018 0100   HGBUR SMALL (A) 03/19/2018 0100   BILIRUBINUR NEGATIVE 03/19/2018 0100   BILIRUBINUR moderate (A) 06/19/2015 1906   KETONESUR NEGATIVE 03/19/2018 0100   PROTEINUR NEGATIVE 03/19/2018 0100   UROBILINOGEN >=8.0 06/19/2015 1906   UROBILINOGEN 0.2 09/14/2011 2141   NITRITE NEGATIVE 03/19/2018 0100   LEUKOCYTESUR NEGATIVE 03/19/2018 0100    Radiological Exams on Admission: Dg Chest 2 View  Result Date: 03/19/2018 CLINICAL DATA:  Found on ground. EXAM: CHEST - 2 VIEW COMPARISON:  03/16/2018 FINDINGS: The heart is upper limits of normal in size given the AP projection and portable technique. No change since prior study. The lungs are clear except  for streaky basilar atelectasis. No pleural effusion or pneumothorax. The bony thorax is intact. IMPRESSION: Streaky basilar atelectasis but no infiltrates, edema or effusions. Electronically Signed  By: Rudie Meyer M.D.   On: 03/19/2018 05:45   Ct Head Wo Contrast  Result Date: 03/18/2018 CLINICAL DATA:  Syncopal episode. Posterior head laceration. Found down on floor. Multiple sclerosis. EXAM: CT HEAD WITHOUT CONTRAST TECHNIQUE: Contiguous axial images were obtained from the base of the skull through the vertex without intravenous contrast. COMPARISON:  03/16/2018 head CT. FINDINGS: Brain: Nonspecific moderate subcortical and periventricular white matter hypodensity, stable. No evidence of parenchymal hemorrhage or extra-axial fluid collection. No mass lesion, mass effect, or midline shift. No CT evidence of acute infarction. Cerebral volume is age appropriate. No ventriculomegaly. Vascular: No acute abnormality. Skull: No evidence of calvarial fracture. Sinuses/Orbits: There is an acute fracture of right orbital floor with near complete opacification of the right maxillary sinus. No evidence of herniated right orbital muscle. Soft tissue emphysema is noted in. Septal inferior right orbit. Other: Small scalp laceration with associated minimal scalp emphysema in the medial left parietal scalp. The mastoid air cells are unopacified. IMPRESSION: 1. Small scalp laceration with associated minimal scalp emphysema in the medial left parietal scalp. 2. No evidence of acute intracranial abnormality. No evidence of calvarial fracture. 3. Acute fracture of the right orbital floor with near complete opacification of the right maxillary sinus. Soft tissue emphysema in the inferior right orbit. 4. Chronic moderate white matter low-attenuation foci compatible with the provided clinical history of multiple sclerosis. Electronically Signed   By: Delbert Phenix M.D.   On: 03/18/2018 23:34   Ct Cervical Spine Wo  Contrast  Result Date: 03/19/2018 CLINICAL DATA:  58 y/o F; found on floor with laceration to head and injury to right cheek bone. EXAM: CT MAXILLOFACIAL WITHOUT CONTRAST CT CERVICAL SPINE WITHOUT CONTRAST TECHNIQUE: Multidetector CT imaging of the maxillofacial structures was performed. Multiplanar CT image reconstructions were also generated. A small metallic BB was placed on the right temple in order to reliably differentiate right from left. Multidetector CT imaging of the cervical spine was performed without intravenous contrast. Multiplanar CT image reconstructions were also generated. COMPARISON:  03/18/2018 CT head. FINDINGS: CT MAXILLOFACIAL FINDINGS Osseous: Multiple levels of motion artifact through the face. Acute fracture of the right floor of orbit measuring 12 x 10 mm (AP by ML series 16, image 30 and series 15, image 29) with 6 mm inferior displacement into the right maxillary sinus. No additional displaced facial fracture or mandibular dislocation identified. Orbits: Small amount of emphysema is present within the right inferior palpebral as well as the extraconal fat of the right inferior orbit. No orbital or retro-orbital hematoma. No significant proptosis. Extraocular muscles are intact without findings of entrapment. Normal left orbit. Sinuses: Right maxillary sinus blood fluid level. Additional visible paranasal sinuses are normally aerated. Under pneumatization of the mastoid air cells. Pneumatized mastoid air cells are normally aerated. Soft tissues: Contusion of right facial and periorbital soft tissues. Limited intracranial: Please refer to same-day CT head. CT CERVICAL FINDINGS Alignment: Normal. Skull base and vertebrae: Motion artifact at the C2 and C6 levels. No acute fracture identified. No primary bone lesion or focal pathologic process. Soft tissues and spinal canal: No prevertebral fluid or swelling. No visible canal hematoma. Disc levels: Mild multilevel discogenic degenerative  changes with small endplate marginal osteophytes. No significant bony foraminal or spinal canal stenosis. Upper chest: Negative. Other: Negative. IMPRESSION: Motion degraded study. 1. Acute fracture of the right floor of orbit measuring up to 12 mm with 6 mm inferior displacement into the right maxillary sinus. Mild infraorbital extraconal pneumatosis. No  orbital or retro-orbital hematoma. No significant proptosis. No muscle entrapment. 2. Right maxillary sinus blood fluid level. 3. No additional displaced fracture or dislocation of the facial bones. 4. Contusion of right facial and periorbital soft tissues. 5. No acute fracture or dislocation of the cervical spine. 6. Mild cervical spine degenerative changes. No significant bony foraminal or spinal canal stenosis. Electronically Signed   By: Mitzi HansenLance  Furusawa-Stratton M.D.   On: 03/19/2018 00:38   Vas [name] Lower Extremity Venous (dvt)  Result Date: 03/17/2018  Lower Venous Study Indications: Edema.  Performing Technologist: Levada Schillingharlotte Bynum RDMS, RVT  Examination Guidelines: A complete evaluation includes B-mode imaging, spectral Doppler, color Doppler, and power Doppler as needed of all accessible portions of each vessel. Bilateral testing is considered an integral part of a complete examination. Limited examinations for reoccurring indications may be performed as noted.  Right Venous Findings: +---------+---------------+---------+-----------+----------+-------+          CompressibilityPhasicitySpontaneityPropertiesSummary +---------+---------------+---------+-----------+----------+-------+ CFV      Full           Yes      Yes                          +---------+---------------+---------+-----------+----------+-------+ SFJ      Full                                                 +---------+---------------+---------+-----------+----------+-------+ FV Prox  Full                                                  +---------+---------------+---------+-----------+----------+-------+ FV Mid   Full                                                 +---------+---------------+---------+-----------+----------+-------+ FV DistalFull                                                 +---------+---------------+---------+-----------+----------+-------+ PFV      Full                                                 +---------+---------------+---------+-----------+----------+-------+ POP      Full           Yes      Yes                          +---------+---------------+---------+-----------+----------+-------+ PTV      Full                                                 +---------+---------------+---------+-----------+----------+-------+ PERO     Full                                                 +---------+---------------+---------+-----------+----------+-------+  GSV      Full                                                 +---------+---------------+---------+-----------+----------+-------+  Left Venous Findings: +---------+---------------+---------+-----------+----------+-------+          CompressibilityPhasicitySpontaneityPropertiesSummary +---------+---------------+---------+-----------+----------+-------+ CFV      Full           Yes      Yes                          +---------+---------------+---------+-----------+----------+-------+ SFJ      Full                                                 +---------+---------------+---------+-----------+----------+-------+ FV Prox  Full                                                 +---------+---------------+---------+-----------+----------+-------+ FV Mid   Full                                                 +---------+---------------+---------+-----------+----------+-------+ FV DistalFull                                                  +---------+---------------+---------+-----------+----------+-------+ PFV      Full                                                 +---------+---------------+---------+-----------+----------+-------+ POP      Full           Yes      Yes                          +---------+---------------+---------+-----------+----------+-------+ PTV      Full                                                 +---------+---------------+---------+-----------+----------+-------+ PERO     Full                                                 +---------+---------------+---------+-----------+----------+-------+ GSV      Full                                                 +---------+---------------+---------+-----------+----------+-------+  Summary: Right: There is no evidence of deep vein thrombosis in the lower extremity. Left: There is no evidence of deep vein thrombosis in the lower extremity. A cystic structure is found in the popliteal fossa.  *See table(s) above for measurements and observations. Electronically signed by Sherald Hess MD on 03/17/2018 at 3:39:56 PM.    Final    Ct Maxillofacial Wo Contrast  Result Date: 03/19/2018 CLINICAL DATA:  58 y/o F; found on floor with laceration to head and injury to right cheek bone. EXAM: CT MAXILLOFACIAL WITHOUT CONTRAST CT CERVICAL SPINE WITHOUT CONTRAST TECHNIQUE: Multidetector CT imaging of the maxillofacial structures was performed. Multiplanar CT image reconstructions were also generated. A small metallic BB was placed on the right temple in order to reliably differentiate right from left. Multidetector CT imaging of the cervical spine was performed without intravenous contrast. Multiplanar CT image reconstructions were also generated. COMPARISON:  03/18/2018 CT head. FINDINGS: CT MAXILLOFACIAL FINDINGS Osseous: Multiple levels of motion artifact through the face. Acute fracture of the right floor of orbit measuring 12 x 10 mm (AP by ML series  16, image 30 and series 15, image 29) with 6 mm inferior displacement into the right maxillary sinus. No additional displaced facial fracture or mandibular dislocation identified. Orbits: Small amount of emphysema is present within the right inferior palpebral as well as the extraconal fat of the right inferior orbit. No orbital or retro-orbital hematoma. No significant proptosis. Extraocular muscles are intact without findings of entrapment. Normal left orbit. Sinuses: Right maxillary sinus blood fluid level. Additional visible paranasal sinuses are normally aerated. Under pneumatization of the mastoid air cells. Pneumatized mastoid air cells are normally aerated. Soft tissues: Contusion of right facial and periorbital soft tissues. Limited intracranial: Please refer to same-day CT head. CT CERVICAL FINDINGS Alignment: Normal. Skull base and vertebrae: Motion artifact at the C2 and C6 levels. No acute fracture identified. No primary bone lesion or focal pathologic process. Soft tissues and spinal canal: No prevertebral fluid or swelling. No visible canal hematoma. Disc levels: Mild multilevel discogenic degenerative changes with small endplate marginal osteophytes. No significant bony foraminal or spinal canal stenosis. Upper chest: Negative. Other: Negative. IMPRESSION: Motion degraded study. 1. Acute fracture of the right floor of orbit measuring up to 12 mm with 6 mm inferior displacement into the right maxillary sinus. Mild infraorbital extraconal pneumatosis. No orbital or retro-orbital hematoma. No significant proptosis. No muscle entrapment. 2. Right maxillary sinus blood fluid level. 3. No additional displaced fracture or dislocation of the facial bones. 4. Contusion of right facial and periorbital soft tissues. 5. No acute fracture or dislocation of the cervical spine. 6. Mild cervical spine degenerative changes. No significant bony foraminal or spinal canal stenosis. Electronically Signed   By: Mitzi Hansen M.D.   On: 03/19/2018 00:38    EKG: Independently reviewed.  Sinus rhythm, nonspecific ST abnormalities.  No significant change since prior tracing.  Assessment/Plan Principal Problem:   Syncope Active Problems:   Hypokalemia   Fall   Orbital fracture   Scalp laceration   Acute respiratory failure with hypoxia and hypercarbia (HCC)   Abdominal tenderness   Unwitnessed fall, syncope -Likely related to alcohol intoxication, benzodiazepine use, and hypoxia 2/2 COPD exacerbation. -Infectious etiology less likely as patient is afebrile and does not have leukocytosis.  Chest x-ray without evidence of pneumonia.  UA not suggestive of infection.  -UDS positive for benzodiazepines -EKG not suggestive of ACS.  Check troponin level. -Cardiac monitoring -Check orthostatics -IV fluid  hydration -Patient is currently very somnolent.  Avoid sedating medications/benzodiazepines. -Frequent neurochecks -Management of COPD exacerbation as mentioned below.  Orbital fracture and scalp laceration secondary to fall -CT maxillofacial showing acute fracture of the right floor of orbit measuring up to 12 mm with 6 mm inferior displacement into the right maxillary sinus.  Mild infraorbital extraconal pneumatosis.  No orbital or retro-orbital hematoma.  No significant proptosis.  No muscle entrapment.  Right maxillary sinus blood fluid level.  There is also evidence of contusion of right facial and periorbital soft tissues. -CT C-spine without acute fracture or dislocation. -CT head showing small scalp laceration with associated minimal scalp emphysema in the medial left parietal scalp.  No evidence of acute intracranial abnormality.  No evidence of calvarial fracture. -Scalp laceration stapled in the ED -Tylenol PRN -Avoid sedating pain medications as patient is currently very somnolent -Consult ENT in the morning  Acute hypoxic hypercarbic respiratory failure secondary to COPD  exacerbation -Per ED provider, oxygen saturation down to mid-low 80s on room air with ambulation.  Placed on 2 L supplemental oxygen.  Noted to be wheezing by EMS and received magnesium 2 g, Solu-Medrol 125 mg, and DuoNeb's. -Chest x-ray showing streaky basilar atelectasis but no infiltrates, edema, or effusions. -ABG showing pH 7.34, PCO2 58, PO2 109. -Continue Solu-Medrol 60 mg every 8 hours -DuoNebs every 6 hours -Albuterol nebulizer every 2 hours as needed -Will hold off antibiotic as patient is afebrile and does not have leukocytosis.  Reports having only minimal nonproductive cough. -Supplemental oxygen -Incentive spirometry  ?A. Fib Per triage note, patient noted to be in A. fib by EMS with rate 80-90.  EKG showing sinus rhythm and in sinus rhythm on monitor. -Cardiac monitoring -Continue to monitor  Hypokalemia -Potassium 3.0 -Check magnesium level -Replete potassium -Continue to monitor BMP  Chronic hypocalcemia -Corrected calcium 8.1. -Check ionized calcium, PTH, and vitamin D levels.  Abdominal tenderness -History of cirrhosis with ascites -Afebrile and no leukocytosis.  LFTs normal.  Abdomen appeared distended on exam with generalized tenderness and guarding.  No rebound or rigidity.  Patient is somnolent and no additional history can be obtained from her at this time. -Abdominal US -Check ammonia level  Alcohol intoxication in the setting of history of alcohol abuse -Blood ethanol level 250. -CIWA monitoring.  Benzodiazepine not ordered at this time as patient is very somnolent and not displaying any signs of withdrawal. -Folate, multivitamin, thiamine  Chronic anemia -Hemoglobin 9.6, stable. -Continue to monitor CBC  Chronic thrombocytopenia -Platelet count 113,000, stable. -Continue to monitor CBC  Unable to safely order home medications at this time as pharmacy medication reconciliation pending.   DVT prophylaxis: SCDs Code Status: Full code.  Unable to  discuss CODE STATUS at this time as patient is very somnolent. Family Communication: No family available. Disposition Plan: Anticipate discharge after clinical improvement Consults called: None Admission status: Observation, telemetry  John GiovanniVasundhra Jonavon Trieu MD [facility] Pager [phone]  If 7PM-7AM, please contact night-coverage [email or web address] Password [email or web address]  03/19/2018, 7:06 AM

## 2018-03-19 NOTE — ED Notes (Signed)
Pt ambulated in [place], balance good. Walk w/o o2 86-88%, with 2L 2 93%

## 2018-03-19 NOTE — ED Notes (Signed)
Returned from CT.

## 2018-03-19 NOTE — ED Notes (Signed)
Pt given sandwich graham crackers x4 , and diet coke

## 2018-03-19 NOTE — Progress Notes (Signed)
PROCEDURE SUMMARY:  Successful US guided paracentesis from right lateral abdomen.  Yielded 2 liters of pink, hazy fluid.  No immediate complications.  Pt tolerated well.   Specimen was sent for labs.  EBL < 53mL  Hoyt Koch PA-C 03/19/2018 1:53 PM

## 2018-03-19 NOTE — Progress Notes (Addendum)
I have seen and assessed patient and I agree with Dr. Gardiner Rhyme assessment and plan.  Patient is a 58 year old female history of ongoing alcohol use, ADHD, anxiety, bipolar disorder, COPD, cirrhosis who was recently admitted at [facility] on 03/16/2018 and discharged on 03/17/2018 per patient's insistence.  Patient was at [facility] for alcohol abuse with acute intoxication, alcoholic cirrhosis with ascites, acute on chronic respiratory failure secondary to COPD exacerbation.  Patient presented back to [facility] long hospital after being found on the floor by family with laceration to the back of the head and injury to the right cheekbone.  On presentation patient noted to be wheezing and initially noted to be very somnolent.  Patient noted to have a orbital floor fracture on the right, laceration, noted to initially be going through withdrawal with alcohol level 250.  UDS was positive for benzos.  ABG done with a pH of 7.3, PCO2 of 58, PO2 of 109.  Patient admitted with hypercarbic respiratory failure secondary to COPD exacerbation and placed on IV Solu-Medrol, scheduled nebs.  Patient noted to be hypokalemic and noted to have diffuse abdominal tenderness felt secondary to cirrhosis with ascites.  Patient also with alcohol intoxication in the setting of alcohol abuse.  At the time of my evaluation and noted with tremulousness.  Patient alert oriented to self place and time.  Place patient on the Ativan withdrawal protocol.  Place patient on the Librium detox protocol.  Ultrasound-guided paracentesis.  Add Pulmicort to patient's regimen, home regimen of lactulose.  Increase IV Solu-Medrol to 125 mg every 8 hours.  PPI and IV Levaquin.  Banana bag.  Supportive care.  Will resume Lasix and spironolactone in the next 24 to 48 hours pending clinical course.  Spoke with ENT, Dr. Suszanne Conners in terms of patient's orbital fracture who felt that if patient's extraocular movements were intact with no visual changes to  follow-up in the outpatient setting post discharge.  No charge.

## 2018-03-19 NOTE — ED Notes (Signed)
Attempted to call report to Dorene Sorrow, [employer] fund[employer]. She will call back when ready.

## 2018-03-19 NOTE — ED Notes (Signed)
Pt ambulated to restroom with walker without difficulty. 

## 2018-03-19 NOTE — ED Notes (Signed)
Report called and given to Dorene Sorrow, [employer] fundraiser. Charge RN of the floor asked patient to be brought up after 0730.

## 2018-03-19 NOTE — ED Notes (Signed)
Patient transported to CT 

## 2018-03-19 NOTE — ED Notes (Signed)
Patient transported to X-ray 

## 2018-03-20 DIAGNOSIS — F10239 Alcohol dependence with withdrawal, unspecified: Secondary | ICD-10-CM | POA: Diagnosis present

## 2018-03-20 DIAGNOSIS — M810 Age-related osteoporosis without current pathological fracture: Secondary | ICD-10-CM | POA: Diagnosis present

## 2018-03-20 DIAGNOSIS — F1010 Alcohol abuse, uncomplicated: Secondary | ICD-10-CM | POA: Diagnosis present

## 2018-03-20 DIAGNOSIS — S0990XA Unspecified injury of head, initial encounter: Secondary | ICD-10-CM

## 2018-03-20 DIAGNOSIS — E876 Hypokalemia: Secondary | ICD-10-CM | POA: Diagnosis present

## 2018-03-20 DIAGNOSIS — K219 Gastro-esophageal reflux disease without esophagitis: Secondary | ICD-10-CM

## 2018-03-20 DIAGNOSIS — S0285XG Fracture of orbit, unspecified, subsequent encounter for fracture with delayed healing: Secondary | ICD-10-CM

## 2018-03-20 DIAGNOSIS — W19XXXA Unspecified fall, initial encounter: Secondary | ICD-10-CM | POA: Diagnosis present

## 2018-03-20 DIAGNOSIS — S0101XA Laceration without foreign body of scalp, initial encounter: Secondary | ICD-10-CM | POA: Diagnosis present

## 2018-03-20 DIAGNOSIS — G43909 Migraine, unspecified, not intractable, without status migrainosus: Secondary | ICD-10-CM | POA: Diagnosis present

## 2018-03-20 DIAGNOSIS — J9811 Atelectasis: Secondary | ICD-10-CM | POA: Diagnosis present

## 2018-03-20 DIAGNOSIS — F1020 Alcohol dependence, uncomplicated: Secondary | ICD-10-CM | POA: Diagnosis not present

## 2018-03-20 DIAGNOSIS — Y92009 Unspecified place in unspecified non-institutional (private) residence as the place of occurrence of the external cause: Secondary | ICD-10-CM | POA: Diagnosis not present

## 2018-03-20 DIAGNOSIS — G35 Multiple sclerosis: Secondary | ICD-10-CM | POA: Diagnosis present

## 2018-03-20 DIAGNOSIS — S0231XA Fracture of orbital floor, right side, initial encounter for closed fracture: Secondary | ICD-10-CM | POA: Diagnosis present

## 2018-03-20 DIAGNOSIS — F419 Anxiety disorder, unspecified: Secondary | ICD-10-CM | POA: Diagnosis present

## 2018-03-20 DIAGNOSIS — Z79899 Other long term (current) drug therapy: Secondary | ICD-10-CM | POA: Diagnosis not present

## 2018-03-20 DIAGNOSIS — J9622 Acute and chronic respiratory failure with hypercapnia: Secondary | ICD-10-CM | POA: Diagnosis present

## 2018-03-20 DIAGNOSIS — D696 Thrombocytopenia, unspecified: Secondary | ICD-10-CM | POA: Diagnosis present

## 2018-03-20 DIAGNOSIS — K7031 Alcoholic cirrhosis of liver with ascites: Secondary | ICD-10-CM | POA: Diagnosis present

## 2018-03-20 DIAGNOSIS — J441 Chronic obstructive pulmonary disease with (acute) exacerbation: Secondary | ICD-10-CM | POA: Diagnosis present

## 2018-03-20 DIAGNOSIS — Z720 Tobacco use: Secondary | ICD-10-CM

## 2018-03-20 DIAGNOSIS — F3162 Bipolar disorder, current episode mixed, moderate: Secondary | ICD-10-CM | POA: Diagnosis present

## 2018-03-20 DIAGNOSIS — Z7951 Long term (current) use of inhaled steroids: Secondary | ICD-10-CM | POA: Diagnosis not present

## 2018-03-20 DIAGNOSIS — S0511XA Contusion of eyeball and orbital tissues, right eye, initial encounter: Secondary | ICD-10-CM | POA: Diagnosis present

## 2018-03-20 DIAGNOSIS — F10229 Alcohol dependence with intoxication, unspecified: Secondary | ICD-10-CM | POA: Diagnosis present

## 2018-03-20 DIAGNOSIS — F1390 Sedative, hypnotic, or anxiolytic use, unspecified, uncomplicated: Secondary | ICD-10-CM | POA: Diagnosis present

## 2018-03-20 DIAGNOSIS — D649 Anemia, unspecified: Secondary | ICD-10-CM | POA: Diagnosis present

## 2018-03-20 DIAGNOSIS — F17210 Nicotine dependence, cigarettes, uncomplicated: Secondary | ICD-10-CM | POA: Diagnosis present

## 2018-03-20 DIAGNOSIS — Y908 Blood alcohol level of 240 mg/100 ml or more: Secondary | ICD-10-CM | POA: Diagnosis present

## 2018-03-20 DIAGNOSIS — J9601 Acute respiratory failure with hypoxia: Secondary | ICD-10-CM | POA: Diagnosis not present

## 2018-03-20 DIAGNOSIS — J9621 Acute and chronic respiratory failure with hypoxia: Secondary | ICD-10-CM | POA: Diagnosis present

## 2018-03-20 LAB — CBC WITH DIFFERENTIAL/PLATELET
Abs Immature Granulocytes: 0.02 10*3/uL (ref 0.00–0.07)
Basophils Absolute: 0 10*3/uL (ref 0.0–0.1)
Basophils Relative: 0 %
Eosinophils Absolute: 0 10*3/uL (ref 0.0–0.5)
Eosinophils Relative: 0 %
HCT: 30.7 % — ABNORMAL LOW (ref 36.0–46.0)
Hemoglobin: 8.5 g/dL — ABNORMAL LOW (ref 12.0–15.0)
Immature Granulocytes: 1 %
Lymphocytes Relative: 5 %
Lymphs Abs: 0.2 10*3/uL — ABNORMAL LOW (ref 0.7–4.0)
MCH: 25.3 pg — AB (ref 26.0–34.0)
MCHC: 27.7 g/dL — AB (ref 30.0–36.0)
MCV: 91.4 fL (ref 80.0–100.0)
MONOS PCT: 3 %
Monocytes Absolute: 0.1 10*3/uL (ref 0.1–1.0)
Neutro Abs: 4 10*3/uL (ref 1.7–7.7)
Neutrophils Relative %: 91 %
Platelets: 88 10*3/uL — ABNORMAL LOW (ref 150–400)
RBC: 3.36 MIL/uL — ABNORMAL LOW (ref 3.87–5.11)
RDW: 15.8 % — ABNORMAL HIGH (ref 11.5–15.5)
WBC: 4.4 10*3/uL (ref 4.0–10.5)
nRBC: 0 % (ref 0.0–0.2)

## 2018-03-20 LAB — COMPREHENSIVE METABOLIC PANEL
ALT: 16 U/L (ref 0–44)
AST: 18 U/L (ref 15–41)
Albumin: 3.1 g/dL — ABNORMAL LOW (ref 3.5–5.0)
Alkaline Phosphatase: 70 U/L (ref 38–126)
Anion gap: 5 (ref 5–15)
BILIRUBIN TOTAL: 1 mg/dL (ref 0.3–1.2)
BUN: 10 mg/dL (ref 6–20)
CO2: 30 mmol/L (ref 22–32)
Calcium: 8.2 mg/dL — ABNORMAL LOW (ref 8.9–10.3)
Chloride: 103 mmol/L (ref 98–111)
Creatinine, Ser: 0.32 mg/dL — ABNORMAL LOW (ref 0.44–1.00)
GFR calc Af Amer: >60 mL/min (ref >60)
GFR calc non Af Amer: >60 mL/min (ref >60)
Glucose, Bld: 142 mg/dL — ABNORMAL HIGH (ref 70–99)
Potassium: 4 mmol/L (ref 3.5–5.1)
Sodium: 138 mmol/L (ref 135–145)
Total Protein: 5.8 g/dL — ABNORMAL LOW (ref 6.5–8.1)

## 2018-03-20 LAB — GRAM STAIN

## 2018-03-20 LAB — VITAMIN D 25 HYDROXY (VIT D DEFICIENCY, FRACTURES): Vit D, 25-Hydroxy: 4.2 ng/mL — ABNORMAL LOW (ref 30.0–100.0)

## 2018-03-20 LAB — CALCIUM, IONIZED: Calcium, Ionized, Serum: 4.3 mg/dL — ABNORMAL LOW (ref 4.5–5.6)

## 2018-03-20 MED ORDER — METHYLPREDNISOLONE SODIUM SUCC 125 MG IJ SOLR: 60.0000 mg | Freq: Three times a day (TID) | INTRAMUSCULAR | Status: DC

## 2018-03-20 MED ORDER — GABAPENTIN 300 MG PO CAPS
300.0000 mg | ORAL_CAPSULE | Freq: Once | ORAL | Status: AC
  Filled 2018-03-20: qty 1

## 2018-03-20 MED ORDER — LEVOFLOXACIN 500 MG PO TABS: 500.0000 mg | ORAL_TABLET | Freq: Every day | ORAL | Status: DC

## 2018-03-20 MED ORDER — SPIRONOLACTONE 25 MG PO TABS
25.0000 mg | ORAL_TABLET | Freq: Every day | ORAL | Status: DC
  Administered 2018-03-20: 25 mg via ORAL
  Filled 2018-03-20: qty 1

## 2018-03-20 MED ORDER — FUROSEMIDE 40 MG PO TABS
40.0000 mg | ORAL_TABLET | Freq: Every day | ORAL | Status: DC
  Administered 2018-03-20: 40 mg via ORAL
  Filled 2018-03-20: qty 1

## 2018-03-20 NOTE — Progress Notes (Signed)
PROGRESS NOTE    Anne Black  IDP:[id number] DOB: 28-Nov-1960 DOA: 03/18/2018 PCP: System, Pcp Not In    Brief Narrative: Patient is a 58 year old female history of ADHD, alcohol abuse, anxiety, bipolar disorder, COPD, cirrhosis, multiple sclerosis presented to the hospital via EMS after being found on the floor by family with laceration to the back of the head and injury to her right cheekbone.  Patient noted to be wheezing per EMS and given steroids, nebulizer treatments.  At time of admission patient noted to be very somnolent.  It is noted per admitting physician that patient had complaints of shortness of breath and wheezing since recent hospital discharge. It is noted that patient only admitted at [facility] 03/16/2018 and discharged on 03/17/2018 per patient's insistence.  At the time of presentation patient was noted to be in acute alcohol intoxication, alcoholic cirrhosis with ascites and acute on chronic respiratory failure secondary to COPD exacerbation.   Assessment & Plan:   Principal Problem:   Acute respiratory failure with hypoxia and hypercarbia (HCC) Active Problems:   GERD (gastroesophageal reflux disease)   Tobacco abuse   Nicotine abuse   Alcohol withdrawal (HCC)   Hypokalemia   Alcohol use disorder, severe, dependence (HCC)   Bipolar disorder, current episode mixed, moderate (HCC)   Alcoholic cirrhosis of liver with ascites (HCC)   Ascites due to alcoholic cirrhosis (HCC)   COPD exacerbation (HCC)   Normocytic anemia   Migraine   ETOH abuse   Fall   Syncope   Orbital fracture   Scalp laceration   Abdominal tenderness  #1 acute on chronic respiratory failure with hypoxia and hypercarbia secondary to acute COPD exacerbation. It is noted on presentation that patient was noted to be hypoxic per ED physician on presentation to the ED with sats of mid to low 80s on room air with ambulation.  Patient also noted to be wheezing by EMS and received IV  Solu-Medrol duo nebs and magnesium.  Chest x-ray done showed basilar atelectasis but no infiltrates, edema or effusions.  Blood gas which was done showed a pH of 7.34, PCO2 of 58, PO2 of 109.  Patient was discharged from [facility] on 03/17/2018 at her insistence after being admitted on 03/16/2018 for probable acute COPD exacerbation, acute alcohol intoxication, cirrhosis with ascites.  With some clinical improvement. Continue Pulmicort,, Claritin, PPI.  Decrease Solu-Medrol to 60 mg IV every 8 hours.  Change IV Levaquin to oral Levaquin.  Continue O2.  Supportive care.  2.  Cirrhosis with abdominal ascites Patient presenting with diffuse abdominal pain with abdominal distention.  Patient with a history of cirrhosis and abdominal ascites.  Patient also with ongoing alcohol abuse.  Patient states her cirrhosis was secondary to chemotherapy from treatment for her multiple sclerosis.  Patient not compliant with her lactulose, Lasix or spironolactone.  Patient follows with a hepatologist in [place].  Patient status post ultrasound-guided paracentesis 03/19/2018 with 2 L of pink hazy fluid removed.  Gram stain and cultures negative.  Clinically improved.  Doubt SBP.  Saline lock IV fluids.  Resume home regimen of Lasix 40 mg daily, spironolactone 25 mg daily.  Continue lactulose.  Will need outpatient follow-up with a hepatologist.  3.  Hypokalemia Repleted.  4.  Alcohol intoxication in the setting of alcohol abuse Blood alcohol level on admission was 250.  Patient with some tremors today.  Continue IV Ativan as needed.  Continue Librium detox protocol.  Continue multivitamin, thiamine, folic acid.  Social  work consulted.  Follow.  5.  Chronic anemia H&H stable.  6.  Chronic thrombocytopenia Patient with no overt bleeding.  Likely secondary to cirrhosis.  Platelet count at 88,000.  Follow.  7.  Orbital fracture and scalp laceration secondary to fall Secondary to alcoholic intoxication.  CT  maxillofacial showed acute fracture of the right floor of the orbit measuring up to 12 mm with 6 mm inferior displacement into the right maxillary sinus.  Mild infraorbital extraconal pneumatosis.  No orbital or retro-orbital hematoma.  No significant proptosis.  No muscle entrapment.  Right maxillary sinus blood fluid level.  There is evidence of contusion of right facial and periorbital soft tissues.  CT C-spine without acute fracture or dislocation.  CT head showing small scalp laceration with associated minimal scalp emphysema in the medial left parietal scalp.  No evidence of acute intracranial abnormality.  No evidence of calvarial fracture.  Scalp laceration stapled in the ED.  Case discussed with ENT, Dr.Teoh who recommended outpatient follow-up 1 week post discharge.  8.  Syncope/unwitnessed fall Likely secondary to alcohol intoxication, benzodiazepine use and hypoxia secondary to COPD exacerbation.  Patient noted on admission to have a alcohol level of 250.  No source of infection noted.  Patient afebrile.  UA negative.  Chest x-ray with no acute infiltrate.  UDS was positive for benzodiazepines.  EKG not suggestive of ACS.  Normal troponin levels.  Continue management of COPD.  Alcohol cessation stressed to patient.  DVT prophylaxis: SCDs Code Status: Full Family Communication: Updated patient.  No family at bedside. Disposition Plan: Likely home when clinically stable hopefully in the next 24 to 48 hours.   Consultants:   None  Procedures:   Ultrasound guided paracentesis 03/19/2018--- 2 L of pink hazy fluid removed per  Loyce Dys, PA IR  CT head 03/18/2018  CT C-spine CT maxillofacial 03/19/2018  Chest x-ray 03/19/2018  Antimicrobials:   IV Levaquin 03/19/2018>>> oral Levaquin 03/21/2018      Subjective: Patient sitting up on bedside commode having a bowel movement.  States her breathing is better.  Asking whether she can be discharged tomorrow.  Denies any chest pain.  No  abdominal pain.  No shortness of breath.  Objective: Vitals:   03/20/18 0005 03/20/18 0412 03/20/18 0544 03/20/18 0732  BP: 124/62 127/71 132/76   Pulse: 72 76 79   Resp:  20    Temp:  98.4 F (36.9 C)    TempSrc:      SpO2:  98%  99%  Weight:  49.4 kg    Height:        Intake/Output Summary (Last 24 hours) at 03/20/2018 1044 Last data filed at 03/20/2018 0600 Gross per 24 hour  Intake 1041.93 ml  Output 500 ml  Net 541.93 ml   Filed Weights   03/20/18 0412  Weight: 49.4 kg    Examination:  General exam: Appears calm and comfortable  Respiratory system: Decreasing expiratory wheezing.  Fair air movement.  No crackles.  No rhonchi.  Respiratory effort normal. Cardiovascular system: S1 & S2 heard, RRR. No JVD, murmurs, rubs, gallops or clicks. No pedal edema. Gastrointestinal system: Abdomen is nondistended, soft and nontender. No organomegaly or masses felt. Normal bowel sounds heard. Central nervous system: Alert and oriented. No focal neurological deficits. Extremities: Symmetric 5 x 5 power. Skin: No rashes, lesions or ulcers Psychiatry: Judgement and insight appear normal. Mood & affect appropriate.     Data Reviewed: I have personally reviewed following labs and imaging  studies  CBC: Recent Labs  Lab 03/16/18 1805 03/17/18 0647 03/18/18 2338 03/19/18 1332 03/20/18 0549  WBC 4.2 4.0 5.3 3.8* 4.4  NEUTROABS 3.0  --   --   --  4.0  HGB 9.5* 8.9* 9.6* 8.5* 8.5*  HCT 32.9* 30.0* 32.9* 29.8* 30.7*  MCV 88.4 86.7 89.9 90.3 91.4  PLT 130* 99* 113* 93* 88*   Basic Metabolic Panel: Recent Labs  Lab 03/16/18 1805 03/17/18 0647 03/18/18 2338 03/19/18 0704 03/19/18 1332 03/20/18 0549  NA 139 139 141  --  140 138  K 3.9 4.1 3.0*  --  3.8 4.0  CL 100 99 99  --  102 103  CO2 28 28 31   --  31 30  GLUCOSE 108* 134* 112*  --  119* 142*  BUN 6 5* 10  --  9 10  CREATININE 0.47 0.46 0.40*  --  0.38* 0.32*  CALCIUM 8.1* 8.2* 7.8*  --  7.9* 8.2*  MG  --   --   --   2.0  --   --    GFR: Estimated Creatinine Clearance: 59.8 mL/min (A) (by C-G formula based on SCr of 0.32 mg/dL (L)). Liver Function Tests: Recent Labs  Lab 03/16/18 1941 03/18/18 2338 03/20/18 0549  AST 39 35 18  ALT 20 21 16   ALKPHOS 97 94 70  BILITOT 1.0 0.9 1.0  PROT 6.6 6.7 5.8*  ALBUMIN 3.4* 3.6 3.1*   Recent Labs  Lab 03/16/18 1941  LIPASE 20   Recent Labs  Lab 03/16/18 1941 03/19/18 0920  AMMONIA 40* 62*   Coagulation Profile: No results for input(s): INR, PROTIME in the last 168 hours. Cardiac Enzymes: Recent Labs  Lab 03/19/18 0704  TROPONINI <0.03   BNP (last 3 results) No results for input(s): PROBNP in the last 8760 hours. HbA1C: No results for input(s): HGBA1C in the last 72 hours. CBG: Recent Labs  Lab 03/18/18 2341  GLUCAP 112*   Lipid Profile: No results for input(s): CHOL, HDL, LDLCALC, TRIG, CHOLHDL, LDLDIRECT in the last 72 hours. Thyroid Function Tests: No results for input(s): TSH, T4TOTAL, FREET4, T3FREE, THYROIDAB in the last 72 hours. Anemia Panel: No results for input(s): VITAMINB12, FOLATE, FERRITIN, TIBC, IRON, RETICCTPCT in the last 72 hours. Sepsis Labs: No results for input(s): PROCALCITON, LATICACIDVEN in the last 168 hours.  Recent Results (from the past 240 hour(s))  Gram stain     Status: None   Collection Time: 03/19/18  1:06 PM  Result Value Ref Range Status   Specimen Description PARACENTESIS FLUID  Final   Special Requests   Final    NONE Performed at [facility], [address], [place], [place] [address]    Gram Stain   Final    MODERATE WBC PRESENT, PREDOMINANTLY MONONUCLEAR NO ORGANISMS SEEN Performed at [facility], [address] [place], [place] [address]    Report Status 03/20/2018 FINAL  Final         Radiology Studies: Dg Chest 2 View  Result Date: 03/19/2018 CLINICAL DATA:  Found on ground. EXAM: CHEST - 2 VIEW COMPARISON:  03/16/2018 FINDINGS: The heart is upper  limits of normal in size given the AP projection and portable technique. No change since prior study. The lungs are clear except for streaky basilar atelectasis. No pleural effusion or pneumothorax. The bony thorax is intact. IMPRESSION: Streaky basilar atelectasis but no infiltrates, edema or effusions. Electronically Signed   By: Orlene Plum.D.  On: 03/19/2018 05:45   Ct Head Wo Contrast  Result Date: 03/18/2018 CLINICAL DATA:  Syncopal episode. Posterior head laceration. Found down on floor. Multiple sclerosis. EXAM: CT HEAD WITHOUT CONTRAST TECHNIQUE: Contiguous axial images were obtained from the base of the skull through the vertex without intravenous contrast. COMPARISON:  03/16/2018 head CT. FINDINGS: Brain: Nonspecific moderate subcortical and periventricular white matter hypodensity, stable. No evidence of parenchymal hemorrhage or extra-axial fluid collection. No mass lesion, mass effect, or midline shift. No CT evidence of acute infarction. Cerebral volume is age appropriate. No ventriculomegaly. Vascular: No acute abnormality. Skull: No evidence of calvarial fracture. Sinuses/Orbits: There is an acute fracture of right orbital floor with near complete opacification of the right maxillary sinus. No evidence of herniated right orbital muscle. Soft tissue emphysema is noted in. Septal inferior right orbit. Other: Small scalp laceration with associated minimal scalp emphysema in the medial left parietal scalp. The mastoid air cells are unopacified. IMPRESSION: 1. Small scalp laceration with associated minimal scalp emphysema in the medial left parietal scalp. 2. No evidence of acute intracranial abnormality. No evidence of calvarial fracture. 3. Acute fracture of the right orbital floor with near complete opacification of the right maxillary sinus. Soft tissue emphysema in the inferior right orbit. 4. Chronic moderate white matter low-attenuation foci compatible with the provided clinical history of  multiple sclerosis. Electronically Signed   By: Delbert PhenixJason A Poff M.D.   On: 03/18/2018 23:34   Ct Cervical Spine Wo Contrast  Result Date: 03/19/2018 CLINICAL DATA:  58 y/o F; found on floor with laceration to head and injury to right cheek bone. EXAM: CT MAXILLOFACIAL WITHOUT CONTRAST CT CERVICAL SPINE WITHOUT CONTRAST TECHNIQUE: Multidetector CT imaging of the maxillofacial structures was performed. Multiplanar CT image reconstructions were also generated. A small metallic BB was placed on the right temple in order to reliably differentiate right from left. Multidetector CT imaging of the cervical spine was performed without intravenous contrast. Multiplanar CT image reconstructions were also generated. COMPARISON:  03/18/2018 CT head. FINDINGS: CT MAXILLOFACIAL FINDINGS Osseous: Multiple levels of motion artifact through the face. Acute fracture of the right floor of orbit measuring 12 x 10 mm (AP by ML series 16, image 30 and series 15, image 29) with 6 mm inferior displacement into the right maxillary sinus. No additional displaced facial fracture or mandibular dislocation identified. Orbits: Small amount of emphysema is present within the right inferior palpebral as well as the extraconal fat of the right inferior orbit. No orbital or retro-orbital hematoma. No significant proptosis. Extraocular muscles are intact without findings of entrapment. Normal left orbit. Sinuses: Right maxillary sinus blood fluid level. Additional visible paranasal sinuses are normally aerated. Under pneumatization of the mastoid air cells. Pneumatized mastoid air cells are normally aerated. Soft tissues: Contusion of right facial and periorbital soft tissues. Limited intracranial: Please refer to same-day CT head. CT CERVICAL FINDINGS Alignment: Normal. Skull base and vertebrae: Motion artifact at the C2 and C6 levels. No acute fracture identified. No primary bone lesion or focal pathologic process. Soft tissues and spinal canal: No  prevertebral fluid or swelling. No visible canal hematoma. Disc levels: Mild multilevel discogenic degenerative changes with small endplate marginal osteophytes. No significant bony foraminal or spinal canal stenosis. Upper chest: Negative. Other: Negative. IMPRESSION: Motion degraded study. 1. Acute fracture of the right floor of orbit measuring up to 12 mm with 6 mm inferior displacement into the right maxillary sinus. Mild infraorbital extraconal pneumatosis. No orbital or retro-orbital hematoma. No significant proptosis.  No muscle entrapment. 2. Right maxillary sinus blood fluid level. 3. No additional displaced fracture or dislocation of the facial bones. 4. Contusion of right facial and periorbital soft tissues. 5. No acute fracture or dislocation of the cervical spine. 6. Mild cervical spine degenerative changes. No significant bony foraminal or spinal canal stenosis. Electronically Signed   By: Mitzi HansenLance  Furusawa-Stratton M.D.   On: 03/19/2018 00:38   [name] Abdomen Complete  Result Date: 03/19/2018 CLINICAL DATA:  Abdominal pain.  Abdominal hernia.  Cirrhosis. EXAM: ABDOMEN ULTRASOUND COMPLETE COMPARISON:  CT of the abdomen and pelvis on 02/28/2018 FINDINGS: Gallbladder: Gallbladder has a normal appearance. Gallbladder wall is 0.9 millimeters, within normal limits. No stones or pericholecystic fluid. Common bile duct: Diameter: 3.4 millimeters Liver: Heterogeneous echotexture. Nodular surface contour. Perihepatic ascites. No focal liver lesions are identified. Portal vein is patent on color Doppler imaging with normal direction of blood flow towards the liver. IVC: No abnormality visualized. Pancreas: Mildly heterogeneous without focal mass. Spleen: Somewhat nodular and enlarged.  No focal splenic lesion. Right Kidney: Length: 11.5 centimeters. Echogenicity within normal limits. No mass or hydronephrosis visualized. Left Kidney: Length: 11.7 centimeter. Echogenicity within normal limits. No mass or  hydronephrosis visualized. Abdominal aorta: Visualized portion is not aneurysmal, 2.1 centimeters. Distal aorta is obscured by bowel gas. Other findings: Ascites. IMPRESSION: 1. Ascites. 2. Cirrhotic morphology of the liver. No focal liver lesions identified sonographically. 3. Splenomegaly. 4. Positive sonographic Murphy sign without other ultrasound findings to suggest acute cholecystitis. Findings raise a question of acalculus cholecystitis. Electronically Signed   By: Norva PavlovElizabeth  Brown M.D.   On: 03/19/2018 08:09   [name] Paracentesis  Result Date: 03/19/2018 INDICATION: Patient with abdominal distention, ascites. Request is made for diagnostic and therapeutic paracentesis. EXAM: ULTRASOUND GUIDED DIAGNOSTIC AND THERAPEUTIC PARACENTESIS MEDICATIONS: 10 mL 1% lidocaine COMPLICATIONS: None immediate. PROCEDURE: Informed written consent was obtained from the patient after a discussion of the risks, benefits and alternatives to treatment. A timeout was performed prior to the initiation of the procedure. Initial ultrasound scanning demonstrates a small amount of ascites within the right lower abdominal quadrant. The right lower abdomen was prepped and draped in the usual sterile fashion. 1% lidocaine was used for local anesthesia. Following this, a 19 gauge, 7-cm, Yueh catheter was introduced. An ultrasound image was saved for documentation purposes. The paracentesis was performed. The catheter was removed and a dressing was applied. The patient tolerated the procedure well without immediate post procedural complication. FINDINGS: A total of approximately 2.0 L of patent, hazy fluid was removed. Samples were sent to the laboratory as requested by the clinical team. IMPRESSION: Successful ultrasound-guided diagnostic and therapeutic paracentesis yielding 2.0 liters of peritoneal fluid. Read by: Loyce DysKacie Matthews PA-C Electronically Signed   By: Gilmer MorJaime  Wagner D.O.   On: 03/19/2018 14:48   Ct Maxillofacial Wo  Contrast  Result Date: 03/19/2018 CLINICAL DATA:  58 y/o F; found on floor with laceration to head and injury to right cheek bone. EXAM: CT MAXILLOFACIAL WITHOUT CONTRAST CT CERVICAL SPINE WITHOUT CONTRAST TECHNIQUE: Multidetector CT imaging of the maxillofacial structures was performed. Multiplanar CT image reconstructions were also generated. A small metallic BB was placed on the right temple in order to reliably differentiate right from left. Multidetector CT imaging of the cervical spine was performed without intravenous contrast. Multiplanar CT image reconstructions were also generated. COMPARISON:  03/18/2018 CT head. FINDINGS: CT MAXILLOFACIAL FINDINGS Osseous: Multiple levels of motion artifact through the face. Acute fracture of the right floor of orbit measuring  12 x 10 mm (AP by ML series 16, image 30 and series 15, image 29) with 6 mm inferior displacement into the right maxillary sinus. No additional displaced facial fracture or mandibular dislocation identified. Orbits: Small amount of emphysema is present within the right inferior palpebral as well as the extraconal fat of the right inferior orbit. No orbital or retro-orbital hematoma. No significant proptosis. Extraocular muscles are intact without findings of entrapment. Normal left orbit. Sinuses: Right maxillary sinus blood fluid level. Additional visible paranasal sinuses are normally aerated. Under pneumatization of the mastoid air cells. Pneumatized mastoid air cells are normally aerated. Soft tissues: Contusion of right facial and periorbital soft tissues. Limited intracranial: Please refer to same-day CT head. CT CERVICAL FINDINGS Alignment: Normal. Skull base and vertebrae: Motion artifact at the C2 and C6 levels. No acute fracture identified. No primary bone lesion or focal pathologic process. Soft tissues and spinal canal: No prevertebral fluid or swelling. No visible canal hematoma. Disc levels: Mild multilevel discogenic degenerative  changes with small endplate marginal osteophytes. No significant bony foraminal or spinal canal stenosis. Upper chest: Negative. Other: Negative. IMPRESSION: Motion degraded study. 1. Acute fracture of the right floor of orbit measuring up to 12 mm with 6 mm inferior displacement into the right maxillary sinus. Mild infraorbital extraconal pneumatosis. No orbital or retro-orbital hematoma. No significant proptosis. No muscle entrapment. 2. Right maxillary sinus blood fluid level. 3. No additional displaced fracture or dislocation of the facial bones. 4. Contusion of right facial and periorbital soft tissues. 5. No acute fracture or dislocation of the cervical spine. 6. Mild cervical spine degenerative changes. No significant bony foraminal or spinal canal stenosis. Electronically Signed   By: Mitzi Hansen M.D.   On: 03/19/2018 00:38        Scheduled Meds: . budesonide (PULMICORT) nebulizer solution  0.5 mg Nebulization BID  . chlordiazePOXIDE  25 mg Oral TID   Followed by  . [START ON 03/21/2018] chlordiazePOXIDE  25 mg Oral BH-qamhs   Followed by  . [START ON 03/22/2018] chlordiazePOXIDE  25 mg Oral Daily  . FLUoxetine  20 mg Oral Daily  . folic acid  1 mg Oral Daily  . furosemide  40 mg Oral Daily  . gabapentin  300 mg Oral TID  . ipratropium-albuterol  3 mL Nebulization TID  . lactulose  20 g Oral BID  . loratadine  10 mg Oral Daily  . magnesium oxide  400 mg Oral Daily  . methylPREDNISolone (SOLU-MEDROL) injection  125 mg Intravenous Q8H  . multivitamin with minerals  1 tablet Oral Daily  . nicotine  21 mg Transdermal Daily  . pantoprazole  40 mg Oral Q0600  . spironolactone  25 mg Oral Daily  . thiamine  100 mg Oral Daily   Or  . thiamine  100 mg Intravenous Daily   Continuous Infusions: . levofloxacin (LEVAQUIN) IV 500 mg (03/19/18 1109)     LOS: 0 days    Time spent: 35 minutes    Ramiro Harvest, MD [facility]  If 7PM-7AM, please contact  night-coverage [email or web address] Password [facility] 03/20/2018, 10:44 AM

## 2018-03-20 NOTE — Progress Notes (Signed)
PT Cancellation Note  Patient Details Name: NIGERIA RICOTTA MRN: [id number] DOB: 06/07/1960   Cancelled Treatment:    Reason Eval/Treat Not Completed: Other (comment)(RN defers PT evaluation due to pt behavior at this time. PT to check back as schedule allows.)  Nicola Police, PT Acute Rehabilitation Services Pager [phone]  Office [phone]   Tyrone Apple D Despina Hidden 03/20/2018, 4:26 PM

## 2018-03-20 NOTE — Progress Notes (Signed)
Pt. Continuously calling out and verballly abusing staff, demanding her medications. Pt. Educated and made aware that medication will be administered when scheduled. Pt. Refusing education and not able to be redirected. Pt. States she would like to leave AMA. Dr. Janee Morn was aware of this from day shift, pt. Was also stating she was wanting to leave AMA but changed her mind. On call NP Craige Cotta paged and made aware. 2 IVs taken out prior to wheeling patient down to the entrance. AC and charge nurse made aware of situation.

## 2018-03-21 NOTE — Discharge Summary (Signed)
Physician Discharge Summary  Anne ApplebaumCarla S Black ZOX:[id number] DOB: 1960-10-24 DOA: 03/18/2018  PCP: System, Pcp Not In  Admit date: 03/18/2018 Discharge date: 03/20/2018 ---843pm   LEFT AMA  Time spent: 20 minutes  Recommendations for Outpatient Follow-up:  1. Patient left AMA.   Discharge Diagnoses:  Principal Problem:   Acute respiratory failure with hypoxia and hypercarbia (HCC) Active Problems:   GERD (gastroesophageal reflux disease)   Tobacco abuse   Nicotine abuse   Alcohol withdrawal (HCC)   Hypokalemia   Alcohol use disorder, severe, dependence (HCC)   Bipolar disorder, current episode mixed, moderate (HCC)   Alcoholic cirrhosis of liver with ascites (HCC)   Ascites due to alcoholic cirrhosis (HCC)   COPD exacerbation (HCC)   Normocytic anemia   Migraine   ETOH abuse   Fall   Syncope   Orbital fracture   Scalp laceration   Abdominal tenderness   Head injury   Discharge Condition: Patient left AMA  Diet recommendation: Patient left AMA  Filed Weights   03/20/18 0412  Weight: 49.4 kg    History of present illness:  Per Dr Raynelle Dickathore Anne Black is a 58 y.o. female with medical history significant of ADHD, alcohol abuse, anxiety, bipolar disorder, COPD, cirrhosis, MS presenting to the hospital via EMS after being found on the floor by family with laceration to the back of her head and injury to her right cheekbone.  Noted to be wheezing by EMS and received magnesium 2 g, Solu-Medrol 125 mg, albuterol 10 mg, and Atrovent 1 mg.  Patient was very somnolent at this time and it is difficult to obtain a thorough history from her.  She remembers falling at home but is not able to give me any details.  Reported having shortness of breath and wheezing since her recent hospital discharge.  Reported having minimal nonproductive cough.  Stated she drank several glasses of wine yesterday and reports drinking on a regular basis.  No additional history could be obtained from the  patient.   Hospital Course:  Patient left AMA. For hospitalization please refer to progress note dictated on 03/20/2018.  Procedures:  Ultrasound guided paracentesis 03/19/2018--- 2 L of pink hazy fluid removed per      Loyce DysKacie Matthews, PA IR  CT head 03/18/2018  CT C-spine CT maxillofacial 03/19/2018  Chest x-ray 03/19/2018  Consultations:  None  Discharge Exam: Vitals:   03/20/18 1334 03/20/18 1810  BP:  (!) 131/58  Pulse:  89  Resp:  20  Temp:  98.1 F (36.7 C)  SpO2: 97% 98%    General: NAD Cardiovascular: Patient left AMA Respiratory: Patient left AMA  Discharge Instructions    Allergies as of 03/20/2018   No Known Allergies     Medication List    ASK your doctor about these medications   aluminum-magnesium hydroxide-simethicone 200-200-20 MG/5ML Susp Commonly known as:  MAALOX Take 30 mLs by mouth 4 (four) times daily as needed (indigestion).   azithromycin 250 MG tablet Commonly known as:  ZITHROMAX Take 1 tablet (250 mg total) by mouth daily. Take first 2 tablets together, then 1 every day until finished.   chlordiazePOXIDE 25 MG capsule Commonly known as:  LIBRIUM 50mg  PO TID x 1D, then 25-50mg  PO BID X 1D, then 25-50mg  PO QD X 1D   FLUoxetine 20 MG capsule Commonly known as:  PROZAC Take 20 mg by mouth daily.   folic acid 1 MG tablet Commonly known as:  FOLVITE Take 1 tablet (1 mg total)  by mouth daily.   furosemide 40 MG tablet Commonly known as:  LASIX Take 1 tablet (40 mg total) by mouth daily.   gabapentin 100 MG capsule Commonly known as:  NEURONTIN Take 2 capsules (200 mg total) by mouth 3 (three) times daily.   hydrocortisone cream 1 % Apply topically 3 (three) times daily.   hydrOXYzine 25 MG tablet Commonly known as:  ATARAX/VISTARIL Take 1 tablet (25 mg total) by mouth daily as needed (anxiety).   ipratropium-albuterol 0.5-2.5 (3) MG/3ML Soln Commonly known as:  DUONEB Take 3 mLs by nebulization every 4 (four) hours as  needed (For wheezing or shortness of breath.).   lactulose 10 GM/15ML solution Commonly known as:  CHRONULAC Take 30 mLs (20 g total) by mouth 2 (two) times daily.   loperamide 2 MG tablet Commonly known as:  IMODIUM A-D Take 2 mg by mouth daily as needed for diarrhea or loose stools.   neomycin-polymyxin-hydrocortisone OTIC solution Commonly known as:  CORTISPORIN Place 3 drops into both ears 3 (three) times daily for 10 days.   nicotine 14 mg/24hr patch Commonly known as:  NICODERM CQ - dosed in mg/24 hours Place 1 patch onto the skin daily.   ondansetron 4 MG disintegrating tablet Commonly known as:  ZOFRAN-ODT Take 4 mg by mouth every 8 (eight) hours as needed for nausea or vomiting.   pantoprazole 40 MG tablet Commonly known as:  PROTONIX Take 1 tablet (40 mg total) by mouth daily.   predniSONE 20 MG tablet Commonly known as:  DELTASONE Take 3 tablets (60 mg total) by mouth daily with breakfast for 4 days. Ask about: Should I take this medication?   promethazine 25 MG tablet Commonly known as:  PHENERGAN Take 1 tablet (25 mg total) by mouth every 6 (six) hours as needed for nausea or vomiting.   rizatriptan 10 MG tablet Commonly known as:  MAXALT Take 10 mg by mouth as needed for migraine. May repeat in 2 hours if needed Ask about: Which instructions should I use?   spironolactone 25 MG tablet Commonly known as:  ALDACTONE Take 1 tablet (25 mg total) by mouth daily for 30 days.   thiamine 100 MG tablet Take 1 tablet (100 mg total) by mouth daily.   VENTOLIN HFA 108 (90 Base) MCG/ACT inhaler Generic drug:  albuterol Inhale 2 puffs into the lungs every 4 (four) hours as needed for wheezing or shortness of breath.   WIXELA INHUB 250-50 MCG/DOSE Aepb Generic drug:  Fluticasone-Salmeterol Inhale 1 puff into the lungs 2 (two) times daily.      No Known Allergies Follow-up Information    Newman Pies, MD. Schedule an appointment as soon as possible for a visit in  1 week(s).   Specialty:  Otolaryngology Contact information: [address] [place] [address] [phone]            The results of significant diagnostics from this hospitalization (including imaging, microbiology, ancillary and laboratory) are listed below for reference.    Significant Diagnostic Studies: Dg Chest 2 View  Result Date: 03/19/2018 CLINICAL DATA:  Found on ground. EXAM: CHEST - 2 VIEW COMPARISON:  03/16/2018 FINDINGS: The heart is upper limits of normal in size given the AP projection and portable technique. No change since prior study. The lungs are clear except for streaky basilar atelectasis. No pleural effusion or pneumothorax. The bony thorax is intact. IMPRESSION: Streaky basilar atelectasis but no infiltrates, edema or effusions. Electronically Signed   By: Demetrius Charity.  Gallerani M.D.   On: 03/19/2018 05:45   Dg Chest 2 View  Result Date: 02/28/2018 CLINICAL DATA:  Coughing and vomiting. EXAM: CHEST - 2 VIEW COMPARISON:  02/23/2018. FINDINGS: Mediastinum and hilar structures normal. Heart size normal. Mild peribronchial cuffing. No focal infiltrate. No pleural effusion or pneumothorax. No acute bony abnormality. IMPRESSION: Mild peribronchial cuffing. Mild bronchitis could not be excluded. Exam is otherwise unremarkable. Electronically Signed   By: Maisie Fus  Register   On: 02/28/2018 10:24   Dg Chest 2 View  Result Date: 02/23/2018 CLINICAL DATA:  ETOH addiction for detox EXAM: CHEST - 2 VIEW COMPARISON:  January 25, 2018 FINDINGS: The heart size and mediastinal contours are within normal limits. Mild scar is identified in the right lung base. There is no focal infiltrate, pulmonary edema, or pleural effusion. The visualized skeletal structures are stable. IMPRESSION: No active cardiopulmonary disease. Electronically Signed   By: Sherian Rein M.D.   On: 02/23/2018 11:45   Ct Head Wo Contrast  Result Date: 03/18/2018 CLINICAL DATA:  Syncopal episode. Posterior  head laceration. Found down on floor. Multiple sclerosis. EXAM: CT HEAD WITHOUT CONTRAST TECHNIQUE: Contiguous axial images were obtained from the base of the skull through the vertex without intravenous contrast. COMPARISON:  03/16/2018 head CT. FINDINGS: Brain: Nonspecific moderate subcortical and periventricular white matter hypodensity, stable. No evidence of parenchymal hemorrhage or extra-axial fluid collection. No mass lesion, mass effect, or midline shift. No CT evidence of acute infarction. Cerebral volume is age appropriate. No ventriculomegaly. Vascular: No acute abnormality. Skull: No evidence of calvarial fracture. Sinuses/Orbits: There is an acute fracture of right orbital floor with near complete opacification of the right maxillary sinus. No evidence of herniated right orbital muscle. Soft tissue emphysema is noted in. Septal inferior right orbit. Other: Small scalp laceration with associated minimal scalp emphysema in the medial left parietal scalp. The mastoid air cells are unopacified. IMPRESSION: 1. Small scalp laceration with associated minimal scalp emphysema in the medial left parietal scalp. 2. No evidence of acute intracranial abnormality. No evidence of calvarial fracture. 3. Acute fracture of the right orbital floor with near complete opacification of the right maxillary sinus. Soft tissue emphysema in the inferior right orbit. 4. Chronic moderate white matter low-attenuation foci compatible with the provided clinical history of multiple sclerosis. Electronically Signed   By: Delbert Phenix M.D.   On: 03/18/2018 23:34   Ct Head Wo Contrast  Result Date: 03/16/2018 CLINICAL DATA:  Altered level of consciousness. History of alcohol ingestion. Responsive to painful stimuli. EXAM: CT HEAD WITHOUT CONTRAST TECHNIQUE: Contiguous axial images were obtained from the base of the skull through the vertex without intravenous contrast. COMPARISON:  03/19/2017 FINDINGS: Motion artifact limits  examination. Brain: Mild diffuse cerebral atrophy. Mild ventricular dilatation consistent with central atrophy. Patchy low-attenuation changes in the deep white matter likely due to small vessel ischemia. No mass-effect or midline shift. No abnormal extra-axial fluid collections. Gray-white matter junctions are distinct. Basal cisterns are not effaced. No acute intracranial hemorrhage. Vascular: No hyperdense vessel or unexpected calcification. Skull: Calvarium appears intact. No acute depressed skull fractures identified. Sinuses/Orbits: Visualized paranasal sinuses and mastoid air cells are clear. Visualization is severely limited due to motion artifact. Other: None. IMPRESSION: No acute intracranial abnormalities. Chronic atrophy and small vessel ischemic changes. Electronically Signed   By: Burman Nieves M.D.   On: 03/16/2018 21:26   Ct Cervical Spine Wo Contrast  Result Date: 03/19/2018 CLINICAL DATA:  57 y/o F; found on floor with laceration  to head and injury to right cheek bone. EXAM: CT MAXILLOFACIAL WITHOUT CONTRAST CT CERVICAL SPINE WITHOUT CONTRAST TECHNIQUE: Multidetector CT imaging of the maxillofacial structures was performed. Multiplanar CT image reconstructions were also generated. A small metallic BB was placed on the right temple in order to reliably differentiate right from left. Multidetector CT imaging of the cervical spine was performed without intravenous contrast. Multiplanar CT image reconstructions were also generated. COMPARISON:  03/18/2018 CT head. FINDINGS: CT MAXILLOFACIAL FINDINGS Osseous: Multiple levels of motion artifact through the face. Acute fracture of the right floor of orbit measuring 12 x 10 mm (AP by ML series 16, image 30 and series 15, image 29) with 6 mm inferior displacement into the right maxillary sinus. No additional displaced facial fracture or mandibular dislocation identified. Orbits: Small amount of emphysema is present within the right inferior palpebral  as well as the extraconal fat of the right inferior orbit. No orbital or retro-orbital hematoma. No significant proptosis. Extraocular muscles are intact without findings of entrapment. Normal left orbit. Sinuses: Right maxillary sinus blood fluid level. Additional visible paranasal sinuses are normally aerated. Under pneumatization of the mastoid air cells. Pneumatized mastoid air cells are normally aerated. Soft tissues: Contusion of right facial and periorbital soft tissues. Limited intracranial: Please refer to same-day CT head. CT CERVICAL FINDINGS Alignment: Normal. Skull base and vertebrae: Motion artifact at the C2 and C6 levels. No acute fracture identified. No primary bone lesion or focal pathologic process. Soft tissues and spinal canal: No prevertebral fluid or swelling. No visible canal hematoma. Disc levels: Mild multilevel discogenic degenerative changes with small endplate marginal osteophytes. No significant bony foraminal or spinal canal stenosis. Upper chest: Negative. Other: Negative. IMPRESSION: Motion degraded study. 1. Acute fracture of the right floor of orbit measuring up to 12 mm with 6 mm inferior displacement into the right maxillary sinus. Mild infraorbital extraconal pneumatosis. No orbital or retro-orbital hematoma. No significant proptosis. No muscle entrapment. 2. Right maxillary sinus blood fluid level. 3. No additional displaced fracture or dislocation of the facial bones. 4. Contusion of right facial and periorbital soft tissues. 5. No acute fracture or dislocation of the cervical spine. 6. Mild cervical spine degenerative changes. No significant bony foraminal or spinal canal stenosis. Electronically Signed   By: Mitzi Hansen M.D.   On: 03/19/2018 00:38   US Abdomen Complete  Result Date: 03/19/2018 CLINICAL DATA:  Abdominal pain.  Abdominal hernia.  Cirrhosis. EXAM: ABDOMEN ULTRASOUND COMPLETE COMPARISON:  CT of the abdomen and pelvis on 02/28/2018 FINDINGS:  Gallbladder: Gallbladder has a normal appearance. Gallbladder wall is 0.9 millimeters, within normal limits. No stones or pericholecystic fluid. Common bile duct: Diameter: 3.4 millimeters Liver: Heterogeneous echotexture. Nodular surface contour. Perihepatic ascites. No focal liver lesions are identified. Portal vein is patent on color Doppler imaging with normal direction of blood flow towards the liver. IVC: No abnormality visualized. Pancreas: Mildly heterogeneous without focal mass. Spleen: Somewhat nodular and enlarged.  No focal splenic lesion. Right Kidney: Length: 11.5 centimeters. Echogenicity within normal limits. No mass or hydronephrosis visualized. Left Kidney: Length: 11.7 centimeter. Echogenicity within normal limits. No mass or hydronephrosis visualized. Abdominal aorta: Visualized portion is not aneurysmal, 2.1 centimeters. Distal aorta is obscured by bowel gas. Other findings: Ascites. IMPRESSION: 1. Ascites. 2. Cirrhotic morphology of the liver. No focal liver lesions identified sonographically. 3. Splenomegaly. 4. Positive sonographic Murphy sign without other ultrasound findings to suggest acute cholecystitis. Findings raise a question of acalculus cholecystitis. Electronically Signed   By: Lanora Manis  Manson PasseyBrown M.D.   On: 03/19/2018 08:09   Ct Abdomen Pelvis W Contrast  Result Date: 02/28/2018 CLINICAL DATA:  Abdominal distension EXAM: CT ABDOMEN AND PELVIS WITH CONTRAST TECHNIQUE: Multidetector CT imaging of the abdomen and pelvis was performed using the standard protocol following bolus administration of intravenous contrast. CONTRAST:  100mL ISOVUE-300 IOPAMIDOL (ISOVUE-300) INJECTION 61% COMPARISON:  06/12/2017 FINDINGS: Lower chest: Lung bases are free of acute infiltrate or sizable effusion. Hepatobiliary: Heterogeneity of the liver is noted consistent with underlying cirrhosis. Small hyperdense lesion is noted in the lateral aspect of the left lobe of the liver best seen on image number  17 of series 2 consistent with small hemangioma. This is stable from the prior exam. Mild perihepatic fluid is noted. Pancreas: Unremarkable. No pancreatic ductal dilatation or surrounding inflammatory changes. Spleen: Spleen is prominent with mild perisplenic varices consistent with portal hypertension. Adrenals/Urinary Tract: Adrenal glands are unremarkable. Kidneys are normal, without renal calculi, focal lesion, or hydronephrosis. Bladder is unremarkable. Stomach/Bowel: Mild diverticular change of the colon is noted without evidence of diverticulitis. The colon is predominately decompressed. The appendix is air-filled and within normal limits. Small bowel shows no obstructive changes. The stomach is within normal limits. Scattered esophageal varices are noted distally. Vascular/Lymphatic: Changes consistent with portal hypertension are noted as described above. Aortic calcifications are noted without aneurysmal dilatation. Scattered likely reactive lymph nodes are noted adjacent to the celiac axis as well as in the portacaval space and periaortic region. Reproductive: Uterus and bilateral adnexa are unremarkable. Other: Free fluid is noted within the pelvis consistent with ascites and the underlying portal hypertension. Musculoskeletal: Mild degenerative changes of lumbar spine are noted. IMPRESSION: Changes consistent with cirrhosis of the liver with portal hypertension and variceal changes. Mild ascites and splenomegaly are noted as well. Diverticulosis without diverticulitis. Electronically Signed   By: Alcide CleverMark  Lukens M.D.   On: 02/28/2018 11:03   [name] Paracentesis  Result Date: 03/19/2018 INDICATION: Patient with abdominal distention, ascites. Request is made for diagnostic and therapeutic paracentesis. EXAM: ULTRASOUND GUIDED DIAGNOSTIC AND THERAPEUTIC PARACENTESIS MEDICATIONS: 10 mL 1% lidocaine COMPLICATIONS: None immediate. PROCEDURE: Informed written consent was obtained from the patient after a  discussion of the risks, benefits and alternatives to treatment. A timeout was performed prior to the initiation of the procedure. Initial ultrasound scanning demonstrates a small amount of ascites within the right lower abdominal quadrant. The right lower abdomen was prepped and draped in the usual sterile fashion. 1% lidocaine was used for local anesthesia. Following this, a 19 gauge, 7-cm, Yueh catheter was introduced. An ultrasound image was saved for documentation purposes. The paracentesis was performed. The catheter was removed and a dressing was applied. The patient tolerated the procedure well without immediate post procedural complication. FINDINGS: A total of approximately 2.0 L of patent, hazy fluid was removed. Samples were sent to the laboratory as requested by the clinical team. IMPRESSION: Successful ultrasound-guided diagnostic and therapeutic paracentesis yielding 2.0 liters of peritoneal fluid. Read by: Loyce DysKacie Matthews PA-C Electronically Signed   By: Gilmer MorJaime  Wagner D.O.   On: 03/19/2018 14:48   Dg Chest Portable 1 View  Result Date: 03/16/2018 CLINICAL DATA:  Poorly responsive after alcohol intake. EXAM: PORTABLE CHEST 1 VIEW COMPARISON:  Chest radiograph February 28, 2018 FINDINGS: Stable cardiomegaly. Interstitial prominence with pulmonary vascular congestion. No pleural effusion or focal consolidation. No pneumothorax. Old LEFT rib fractures. Breast implants. IMPRESSION: Stable cardiomegaly and pulmonary vascular congestion. Interstitial prominence seen with atypical infection and/or pulmonary edema. Electronically Signed  By: Awilda Metro M.D.   On: 03/16/2018 18:25   Vas [place] Lower Extremity Venous (dvt)  Result Date: 03/17/2018  Lower Venous Study Indications: Edema.  Performing Technologist: Levada Schilling RDMS, RVT  Examination Guidelines: A complete evaluation includes B-mode imaging, spectral Doppler, color Doppler, and power Doppler as needed of all accessible portions of each  vessel. Bilateral testing is considered an integral part of a complete examination. Limited examinations for reoccurring indications may be performed as noted.  Right Venous Findings: +---------+---------------+---------+-----------+----------+-------+          CompressibilityPhasicitySpontaneityPropertiesSummary +---------+---------------+---------+-----------+----------+-------+ CFV      Full           Yes      Yes                          +---------+---------------+---------+-----------+----------+-------+ SFJ      Full                                                 +---------+---------------+---------+-----------+----------+-------+ FV Prox  Full                                                 +---------+---------------+---------+-----------+----------+-------+ FV Mid   Full                                                 +---------+---------------+---------+-----------+----------+-------+ FV DistalFull                                                 +---------+---------------+---------+-----------+----------+-------+ PFV      Full                                                 +---------+---------------+---------+-----------+----------+-------+ POP      Full           Yes      Yes                          +---------+---------------+---------+-----------+----------+-------+ PTV      Full                                                 +---------+---------------+---------+-----------+----------+-------+ PERO     Full                                                 +---------+---------------+---------+-----------+----------+-------+ GSV      Full                                                 +---------+---------------+---------+-----------+----------+-------+  Left Venous Findings: +---------+---------------+---------+-----------+----------+-------+          CompressibilityPhasicitySpontaneityPropertiesSummary  +---------+---------------+---------+-----------+----------+-------+ CFV      Full           Yes      Yes                          +---------+---------------+---------+-----------+----------+-------+ SFJ      Full                                                 +---------+---------------+---------+-----------+----------+-------+ FV Prox  Full                                                 +---------+---------------+---------+-----------+----------+-------+ FV Mid   Full                                                 +---------+---------------+---------+-----------+----------+-------+ FV DistalFull                                                 +---------+---------------+---------+-----------+----------+-------+ PFV      Full                                                 +---------+---------------+---------+-----------+----------+-------+ POP      Full           Yes      Yes                          +---------+---------------+---------+-----------+----------+-------+ PTV      Full                                                 +---------+---------------+---------+-----------+----------+-------+ PERO     Full                                                 +---------+---------------+---------+-----------+----------+-------+ GSV      Full                                                 +---------+---------------+---------+-----------+----------+-------+    Summary: Right: There is no evidence of deep vein thrombosis in the lower extremity. Left: There is no evidence of deep vein thrombosis in the lower extremity. A cystic structure is found in the popliteal fossa.  *See table(s) above for measurements and observations.  Electronically signed by Sherald Hess MD on 03/17/2018 at 3:39:56 PM.    Final    Ct Maxillofacial Wo Contrast  Result Date: 03/19/2018 CLINICAL DATA:  58 y/o F; found on floor with laceration to head and injury to right  cheek bone. EXAM: CT MAXILLOFACIAL WITHOUT CONTRAST CT CERVICAL SPINE WITHOUT CONTRAST TECHNIQUE: Multidetector CT imaging of the maxillofacial structures was performed. Multiplanar CT image reconstructions were also generated. A small metallic BB was placed on the right temple in order to reliably differentiate right from left. Multidetector CT imaging of the cervical spine was performed without intravenous contrast. Multiplanar CT image reconstructions were also generated. COMPARISON:  03/18/2018 CT head. FINDINGS: CT MAXILLOFACIAL FINDINGS Osseous: Multiple levels of motion artifact through the face. Acute fracture of the right floor of orbit measuring 12 x 10 mm (AP by ML series 16, image 30 and series 15, image 29) with 6 mm inferior displacement into the right maxillary sinus. No additional displaced facial fracture or mandibular dislocation identified. Orbits: Small amount of emphysema is present within the right inferior palpebral as well as the extraconal fat of the right inferior orbit. No orbital or retro-orbital hematoma. No significant proptosis. Extraocular muscles are intact without findings of entrapment. Normal left orbit. Sinuses: Right maxillary sinus blood fluid level. Additional visible paranasal sinuses are normally aerated. Under pneumatization of the mastoid air cells. Pneumatized mastoid air cells are normally aerated. Soft tissues: Contusion of right facial and periorbital soft tissues. Limited intracranial: Please refer to same-day CT head. CT CERVICAL FINDINGS Alignment: Normal. Skull base and vertebrae: Motion artifact at the C2 and C6 levels. No acute fracture identified. No primary bone lesion or focal pathologic process. Soft tissues and spinal canal: No prevertebral fluid or swelling. No visible canal hematoma. Disc levels: Mild multilevel discogenic degenerative changes with small endplate marginal osteophytes. No significant bony foraminal or spinal canal stenosis. Upper chest:  Negative. Other: Negative. IMPRESSION: Motion degraded study. 1. Acute fracture of the right floor of orbit measuring up to 12 mm with 6 mm inferior displacement into the right maxillary sinus. Mild infraorbital extraconal pneumatosis. No orbital or retro-orbital hematoma. No significant proptosis. No muscle entrapment. 2. Right maxillary sinus blood fluid level. 3. No additional displaced fracture or dislocation of the facial bones. 4. Contusion of right facial and periorbital soft tissues. 5. No acute fracture or dislocation of the cervical spine. 6. Mild cervical spine degenerative changes. No significant bony foraminal or spinal canal stenosis. Electronically Signed   By: Mitzi Hansen M.D.   On: 03/19/2018 00:38    Microbiology: Recent Results (from the past 240 hour(s))  Gram stain     Status: None   Collection Time: 03/19/18  1:06 PM  Result Value Ref Range Status   Specimen Description PARACENTESIS FLUID  Final   Special Requests   Final    NONE Performed at [facility], [address] [place], [place] [address]    Gram Stain   Final    MODERATE WBC PRESENT, PREDOMINANTLY MONONUCLEAR NO ORGANISMS SEEN Performed at [facility], [address], [place], [place] [address]    Report Status 03/20/2018 FINAL  Final  Culture, body fluid-bottle     Status: None (Preliminary result)   Collection Time: 03/19/18  1:06 PM  Result Value Ref Range Status   Specimen Description PARACENTESIS FLUID  Final   Special Requests NONE  Final   Culture   Final    NO GROWTH 2 DAYS Performed at  [facility], [address] [place], [place] [address]    Report Status PENDING  Incomplete     Labs: Basic Metabolic Panel: Recent Labs  Lab 03/16/18 1805 03/17/18 0647 03/18/18 2338 03/19/18 0704 03/19/18 1332 03/20/18 0549  NA 139 139 141  --  140 138  K 3.9 4.1 3.0*  --  3.8 4.0  CL 100 99 99  --  102 103  CO2 28 28 31   --  31 30  GLUCOSE 108* 134*  112*  --  119* 142*  BUN 6 5* 10  --  9 10  CREATININE 0.47 0.46 0.40*  --  0.38* 0.32*  CALCIUM 8.1* 8.2* 7.8*  --  7.9* 8.2*  MG  --   --   --  2.0  --   --    Liver Function Tests: Recent Labs  Lab 03/16/18 1941 03/18/18 2338 03/20/18 0549  AST 39 35 18  ALT 20 21 16   ALKPHOS 97 94 70  BILITOT 1.0 0.9 1.0  PROT 6.6 6.7 5.8*  ALBUMIN 3.4* 3.6 3.1*   Recent Labs  Lab 03/16/18 1941  LIPASE 20   Recent Labs  Lab 03/16/18 1941 03/19/18 0920  AMMONIA 40* 62*   CBC: Recent Labs  Lab 03/16/18 1805 03/17/18 0647 03/18/18 2338 03/19/18 1332 03/20/18 0549  WBC 4.2 4.0 5.3 3.8* 4.4  NEUTROABS 3.0  --   --   --  4.0  HGB 9.5* 8.9* 9.6* 8.5* 8.5*  HCT 32.9* 30.0* 32.9* 29.8* 30.7*  MCV 88.4 86.7 89.9 90.3 91.4  PLT 130* 99* 113* 93* 88*   Cardiac Enzymes: Recent Labs  Lab 03/19/18 0704  TROPONINI <0.03   BNP: BNP (last 3 results) Recent Labs    08/28/17 0642 01/25/18 2048 03/16/18 1805  BNP 49.6 109.9* 643.0*    ProBNP (last 3 results) No results for input(s): PROBNP in the last 8760 hours.  CBG: Recent Labs  Lab 03/18/18 2341  GLUCAP 112*    No charge   Signed:  Ramiro Harvest MD.  [facility] 03/21/2018, 7:39 AM

## 2018-03-23 ENCOUNTER — Emergency Department (HOSPITAL_COMMUNITY): Payer: 59

## 2018-03-23 ENCOUNTER — Inpatient Hospital Stay (HOSPITAL_COMMUNITY)
Admission: EM | Admit: 2018-03-23 | Discharge: 2018-03-28 | DRG: 308 | Disposition: A | Discharge status: Home Health | Payer: 59 | Attending: Internal Medicine | Admitting: Internal Medicine

## 2018-03-23 ENCOUNTER — Encounter (HOSPITAL_COMMUNITY): Payer: Self-pay | Admitting: Emergency Medicine

## 2018-03-23 DIAGNOSIS — J449 Chronic obstructive pulmonary disease, unspecified: Secondary | ICD-10-CM | POA: Diagnosis present

## 2018-03-23 DIAGNOSIS — K219 Gastro-esophageal reflux disease without esophagitis: Secondary | ICD-10-CM | POA: Diagnosis present

## 2018-03-23 DIAGNOSIS — F909 Attention-deficit hyperactivity disorder, unspecified type: Secondary | ICD-10-CM | POA: Diagnosis present

## 2018-03-23 DIAGNOSIS — I5033 Acute on chronic diastolic (congestive) heart failure: Secondary | ICD-10-CM | POA: Diagnosis present

## 2018-03-23 DIAGNOSIS — Z9119 Patient's noncompliance with other medical treatment and regimen: Secondary | ICD-10-CM | POA: Diagnosis not present

## 2018-03-23 DIAGNOSIS — F10930 Alcohol use, unspecified with withdrawal, uncomplicated: Secondary | ICD-10-CM

## 2018-03-23 DIAGNOSIS — G35 Multiple sclerosis: Secondary | ICD-10-CM | POA: Diagnosis present

## 2018-03-23 DIAGNOSIS — I4891 Unspecified atrial fibrillation: Secondary | ICD-10-CM | POA: Diagnosis present

## 2018-03-23 DIAGNOSIS — Z9111 Patient's noncompliance with dietary regimen: Secondary | ICD-10-CM

## 2018-03-23 DIAGNOSIS — F17210 Nicotine dependence, cigarettes, uncomplicated: Secondary | ICD-10-CM | POA: Diagnosis present

## 2018-03-23 DIAGNOSIS — F1020 Alcohol dependence, uncomplicated: Secondary | ICD-10-CM | POA: Diagnosis present

## 2018-03-23 DIAGNOSIS — Z8249 Family history of ischemic heart disease and other diseases of the circulatory system: Secondary | ICD-10-CM

## 2018-03-23 DIAGNOSIS — E860 Dehydration: Secondary | ICD-10-CM | POA: Diagnosis present

## 2018-03-23 DIAGNOSIS — F10230 Alcohol dependence with withdrawal, uncomplicated: Secondary | ICD-10-CM

## 2018-03-23 DIAGNOSIS — Z811 Family history of alcohol abuse and dependence: Secondary | ICD-10-CM

## 2018-03-23 DIAGNOSIS — Z91199 Patient's noncompliance with other medical treatment and regimen due to unspecified reason: Secondary | ICD-10-CM

## 2018-03-23 DIAGNOSIS — Y903 Blood alcohol level of 60-79 mg/100 ml: Secondary | ICD-10-CM | POA: Diagnosis present

## 2018-03-23 DIAGNOSIS — R296 Repeated falls: Secondary | ICD-10-CM | POA: Diagnosis present

## 2018-03-23 DIAGNOSIS — Z79899 Other long term (current) drug therapy: Secondary | ICD-10-CM | POA: Diagnosis not present

## 2018-03-23 DIAGNOSIS — F319 Bipolar disorder, unspecified: Secondary | ICD-10-CM | POA: Diagnosis present

## 2018-03-23 DIAGNOSIS — Z9114 Patient's other noncompliance with medication regimen: Secondary | ICD-10-CM | POA: Diagnosis not present

## 2018-03-23 DIAGNOSIS — Z720 Tobacco use: Secondary | ICD-10-CM | POA: Diagnosis present

## 2018-03-23 DIAGNOSIS — F419 Anxiety disorder, unspecified: Secondary | ICD-10-CM | POA: Diagnosis present

## 2018-03-23 DIAGNOSIS — K7031 Alcoholic cirrhosis of liver with ascites: Secondary | ICD-10-CM | POA: Diagnosis present

## 2018-03-23 DIAGNOSIS — W19XXXA Unspecified fall, initial encounter: Secondary | ICD-10-CM | POA: Diagnosis present

## 2018-03-23 DIAGNOSIS — I480 Paroxysmal atrial fibrillation: Principal | ICD-10-CM | POA: Diagnosis present

## 2018-03-23 DIAGNOSIS — G47419 Narcolepsy without cataplexy: Secondary | ICD-10-CM | POA: Diagnosis present

## 2018-03-23 DIAGNOSIS — M810 Age-related osteoporosis without current pathological fracture: Secondary | ICD-10-CM | POA: Diagnosis present

## 2018-03-23 DIAGNOSIS — E876 Hypokalemia: Secondary | ICD-10-CM | POA: Diagnosis present

## 2018-03-23 LAB — CBC WITH DIFFERENTIAL/PLATELET
ABS IMMATURE GRANULOCYTES: 0.03 10*3/uL (ref 0.00–0.07)
Basophils Absolute: 0 10*3/uL (ref 0.0–0.1)
Basophils Relative: 0 %
Eosinophils Absolute: 0.2 10*3/uL (ref 0.0–0.5)
Eosinophils Relative: 2 %
HCT: 35.8 % — ABNORMAL LOW (ref 36.0–46.0)
Hemoglobin: 10.5 g/dL — ABNORMAL LOW (ref 12.0–15.0)
IMMATURE GRANULOCYTES: 0 %
LYMPHS PCT: 13 %
Lymphs Abs: 1 10*3/uL (ref 0.7–4.0)
MCH: 24.7 pg — ABNORMAL LOW (ref 26.0–34.0)
MCHC: 29.3 g/dL — ABNORMAL LOW (ref 30.0–36.0)
MCV: 84.2 fL (ref 80.0–100.0)
Monocytes Absolute: 0.9 10*3/uL (ref 0.1–1.0)
Monocytes Relative: 12 %
NEUTROS ABS: 5.4 10*3/uL (ref 1.7–7.7)
NEUTROS PCT: 73 %
Platelets: UNDETERMINED 10*3/uL (ref 150–400)
RBC: 4.25 MIL/uL (ref 3.87–5.11)
RDW: 15.9 % — ABNORMAL HIGH (ref 11.5–15.5)
WBC: 7.4 10*3/uL (ref 4.0–10.5)
nRBC: 0 % (ref 0.0–0.2)

## 2018-03-23 LAB — TROPONIN I: Troponin I: <0.03 ng/mL (ref <0.03)

## 2018-03-23 LAB — COMPREHENSIVE METABOLIC PANEL
ALBUMIN: 2.9 g/dL — AB (ref 3.5–5.0)
ALT: 25 U/L (ref 0–44)
AST: 39 U/L (ref 15–41)
Alkaline Phosphatase: 92 U/L (ref 38–126)
Anion gap: 13 (ref 5–15)
BUN: <5 mg/dL — ABNORMAL LOW (ref 6–20)
CO2: 29 mmol/L (ref 22–32)
CREATININE: 0.44 mg/dL (ref 0.44–1.00)
Calcium: 7.8 mg/dL — ABNORMAL LOW (ref 8.9–10.3)
Chloride: 99 mmol/L (ref 98–111)
GFR calc Af Amer: >60 mL/min (ref >60)
GFR calc non Af Amer: >60 mL/min (ref >60)
Glucose, Bld: 114 mg/dL — ABNORMAL HIGH (ref 70–99)
Potassium: 2.7 mmol/L — CL (ref 3.5–5.1)
Sodium: 141 mmol/L (ref 135–145)
Total Bilirubin: 0.7 mg/dL (ref 0.3–1.2)
Total Protein: 5.6 g/dL — ABNORMAL LOW (ref 6.5–8.1)

## 2018-03-23 LAB — PROTIME-INR
INR: 1.25
Prothrombin Time: 15.6 seconds — ABNORMAL HIGH (ref 11.4–15.2)

## 2018-03-23 LAB — AMMONIA: Ammonia: 28 umol/L (ref 9–35)

## 2018-03-23 LAB — MAGNESIUM: MAGNESIUM: 1.8 mg/dL (ref 1.7–2.4)

## 2018-03-23 LAB — CK: Total CK: 147 U/L (ref 38–234)

## 2018-03-23 LAB — ETHANOL: Alcohol, Ethyl (B): 75 mg/dL — ABNORMAL HIGH (ref <10)

## 2018-03-23 MED ORDER — ENOXAPARIN SODIUM 40 MG/0.4ML ~~LOC~~ SOLN
40.0000 mg | SUBCUTANEOUS | Status: DC
  Administered 2018-03-23 – 2018-03-26 (×4): 40 mg via SUBCUTANEOUS
  Filled 2018-03-23 (×4): qty 0.4

## 2018-03-23 MED ORDER — ADULT MULTIVITAMIN W/MINERALS CH
1.0000 | ORAL_TABLET | Freq: Every day | ORAL | Status: DC
  Administered 2018-03-23 – 2018-03-24 (×2): 1 via ORAL
  Filled 2018-03-23 (×2): qty 1

## 2018-03-23 MED ORDER — NICOTINE 21 MG/24HR TD PT24
21.0000 mg | MEDICATED_PATCH | Freq: Every day | TRANSDERMAL | Status: DC
  Administered 2018-03-23 – 2018-03-28 (×6): 21 mg via TRANSDERMAL
  Filled 2018-03-23 (×6): qty 1

## 2018-03-23 MED ORDER — FOLIC ACID 1 MG PO TABS
1.0000 mg | ORAL_TABLET | Freq: Every day | ORAL | Status: DC
  Administered 2018-03-23 – 2018-03-24 (×2): 1 mg via ORAL
  Filled 2018-03-23 (×2): qty 1

## 2018-03-23 MED ORDER — FOLIC ACID 1 MG PO TABS: 1.0000 mg | ORAL_TABLET | Freq: Every day | ORAL | Status: DC

## 2018-03-23 MED ORDER — FLUOXETINE HCL 20 MG PO CAPS
20.0000 mg | ORAL_CAPSULE | Freq: Every day | ORAL | Status: DC
  Administered 2018-03-24 – 2018-03-28 (×5): 20 mg via ORAL
  Filled 2018-03-23 (×5): qty 1

## 2018-03-23 MED ORDER — HYDROXYZINE HCL 25 MG PO TABS
25.0000 mg | ORAL_TABLET | Freq: Every day | ORAL | Status: DC | PRN
  Administered 2018-03-23 – 2018-03-25 (×4): 25 mg via ORAL
  Filled 2018-03-23 (×4): qty 1

## 2018-03-23 MED ORDER — DILTIAZEM LOAD VIA INFUSION
10.0000 mg | Freq: Once | INTRAVENOUS | Status: AC
  Administered 2018-03-23: 10 mg via INTRAVENOUS
  Filled 2018-03-23: qty 10

## 2018-03-23 MED ORDER — DOCUSATE SODIUM 100 MG PO CAPS
100.0000 mg | ORAL_CAPSULE | Freq: Two times a day (BID) | ORAL | Status: DC | PRN
  Administered 2018-03-25 – 2018-03-27 (×4): 100 mg via ORAL
  Filled 2018-03-23 (×3): qty 1

## 2018-03-23 MED ORDER — PROMETHAZINE HCL 25 MG PO TABS
25.0000 mg | ORAL_TABLET | Freq: Four times a day (QID) | ORAL | Status: DC | PRN
  Administered 2018-03-23 – 2018-03-27 (×2): 25 mg via ORAL
  Filled 2018-03-23 (×2): qty 1

## 2018-03-23 MED ORDER — MAGNESIUM SULFATE 2 GM/50ML IV SOLN
2.0000 g | Freq: Once | INTRAVENOUS | Status: AC
  Administered 2018-03-23: 2 g via INTRAVENOUS
  Filled 2018-03-23: qty 50

## 2018-03-23 MED ORDER — GABAPENTIN 300 MG PO CAPS
300.0000 mg | ORAL_CAPSULE | Freq: Three times a day (TID) | ORAL | Status: DC
  Administered 2018-03-23 – 2018-03-28 (×13): 300 mg via ORAL
  Filled 2018-03-23 (×13): qty 1

## 2018-03-23 MED ORDER — ADULT MULTIVITAMIN W/MINERALS CH: 1.0000 | ORAL_TABLET | Freq: Every day | ORAL | Status: DC

## 2018-03-23 MED ORDER — SODIUM CHLORIDE 0.9 % IV BOLUS
500.0000 mL | Freq: Once | INTRAVENOUS | Status: AC
  Administered 2018-03-23: 500 mL via INTRAVENOUS

## 2018-03-23 MED ORDER — ALBUTEROL SULFATE (2.5 MG/3ML) 0.083% IN NEBU: 2.5000 mg | INHALATION_SOLUTION | RESPIRATORY_TRACT | Status: DC | PRN

## 2018-03-23 MED ORDER — ALUM & MAG HYDROXIDE-SIMETH 200-200-20 MG/5ML PO SUSP
30.0000 mL | Freq: Four times a day (QID) | ORAL | Status: DC | PRN
  Administered 2018-03-27: 30 mL via ORAL
  Filled 2018-03-23: qty 30

## 2018-03-23 MED ORDER — POTASSIUM CHLORIDE 10 MEQ/100ML IV SOLN
10.0000 meq | INTRAVENOUS | Status: AC
  Administered 2018-03-23: 10 meq via INTRAVENOUS
  Filled 2018-03-23: qty 100

## 2018-03-23 MED ORDER — LORAZEPAM 1 MG PO TABS: 2.0000 mg | ORAL_TABLET | Freq: Four times a day (QID) | ORAL | Status: DC | PRN

## 2018-03-23 MED ORDER — ACETAMINOPHEN 325 MG PO TABS
650.0000 mg | ORAL_TABLET | Freq: Once | ORAL | Status: AC
  Administered 2018-03-23: 650 mg via ORAL
  Filled 2018-03-23: qty 2

## 2018-03-23 MED ORDER — TRAZODONE HCL 50 MG PO TABS
50.0000 mg | ORAL_TABLET | Freq: Once | ORAL | Status: AC
  Administered 2018-03-23: 50 mg via ORAL
  Filled 2018-03-23: qty 1

## 2018-03-23 MED ORDER — DILTIAZEM HCL-DEXTROSE 100-5 MG/100ML-% IV SOLN (PREMIX)
5.0000 mg/h | INTRAVENOUS | Status: DC
  Administered 2018-03-23: 5 mg/h via INTRAVENOUS
  Administered 2018-03-24: 7.5 mg/h via INTRAVENOUS
  Filled 2018-03-23 (×2): qty 100

## 2018-03-23 MED ORDER — MOMETASONE FURO-FORMOTEROL FUM 200-5 MCG/ACT IN AERO
2.0000 | INHALATION_SPRAY | Freq: Two times a day (BID) | RESPIRATORY_TRACT | Status: DC
  Administered 2018-03-24 – 2018-03-28 (×9): 2 via RESPIRATORY_TRACT
  Filled 2018-03-23: qty 8.8

## 2018-03-23 MED ORDER — IPRATROPIUM-ALBUTEROL 0.5-2.5 (3) MG/3ML IN SOLN: 3.0000 mL | RESPIRATORY_TRACT | Status: DC | PRN

## 2018-03-23 MED ORDER — NEOMYCIN-POLYMYXIN-HC 3.5-10000-1 OT SOLN
3.0000 [drp] | Freq: Three times a day (TID) | OTIC | Status: DC
  Administered 2018-03-24 – 2018-03-27 (×10): 3 [drp] via OTIC
  Filled 2018-03-23 (×2): qty 10

## 2018-03-23 MED ORDER — POTASSIUM CHLORIDE CRYS ER 20 MEQ PO TBCR
40.0000 meq | EXTENDED_RELEASE_TABLET | Freq: Once | ORAL | Status: AC
  Administered 2018-03-23: 40 meq via ORAL
  Filled 2018-03-23: qty 2

## 2018-03-23 MED ORDER — HYDROCORTISONE 1 % EX CREA: 1.0000 "application " | TOPICAL_CREAM | Freq: Every day | CUTANEOUS | Status: DC | PRN

## 2018-03-23 MED ORDER — LORAZEPAM 2 MG/ML IJ SOLN
2.0000 mg | Freq: Four times a day (QID) | INTRAMUSCULAR | Status: DC | PRN
  Administered 2018-03-24: 2 mg via INTRAVENOUS
  Filled 2018-03-23: qty 1

## 2018-03-23 MED ORDER — ONDANSETRON HCL 4 MG PO TABS: 4.0000 mg | ORAL_TABLET | Freq: Four times a day (QID) | ORAL | Status: DC | PRN

## 2018-03-23 MED ORDER — BISACODYL 10 MG RE SUPP
10.0000 mg | Freq: Every day | RECTAL | Status: DC | PRN
  Filled 2018-03-23: qty 1

## 2018-03-23 MED ORDER — LACTULOSE 10 GM/15ML PO SOLN
20.0000 g | Freq: Two times a day (BID) | ORAL | Status: DC
  Filled 2018-03-23 (×3): qty 30

## 2018-03-23 MED ORDER — VITAMIN B-1 100 MG PO TABS
100.0000 mg | ORAL_TABLET | Freq: Every day | ORAL | Status: DC
  Administered 2018-03-23 – 2018-03-24 (×2): 100 mg via ORAL
  Filled 2018-03-23 (×2): qty 1

## 2018-03-23 MED ORDER — ONDANSETRON 4 MG PO TBDP
4.0000 mg | ORAL_TABLET | Freq: Three times a day (TID) | ORAL | Status: DC | PRN
  Administered 2018-03-24 – 2018-03-26 (×3): 4 mg via ORAL
  Filled 2018-03-23 (×4): qty 1

## 2018-03-23 MED ORDER — LORAZEPAM 2 MG/ML IJ SOLN
2.0000 mg | INTRAMUSCULAR | Status: DC | PRN
  Administered 2018-03-23: 2 mg via INTRAVENOUS
  Filled 2018-03-23: qty 1

## 2018-03-23 MED ORDER — LORAZEPAM 2 MG/ML IJ SOLN
1.0000 mg | Freq: Once | INTRAMUSCULAR | Status: AC
  Administered 2018-03-23: 1 mg via INTRAVENOUS
  Filled 2018-03-23: qty 1

## 2018-03-23 MED ORDER — THIAMINE HCL 100 MG PO TABS: 100.0000 mg | ORAL_TABLET | Freq: Every day | ORAL | Status: DC

## 2018-03-23 MED ORDER — PANTOPRAZOLE SODIUM 40 MG PO TBEC
40.0000 mg | DELAYED_RELEASE_TABLET | Freq: Every day | ORAL | Status: DC
  Administered 2018-03-24 – 2018-03-28 (×5): 40 mg via ORAL
  Filled 2018-03-23 (×5): qty 1

## 2018-03-23 MED ORDER — ONDANSETRON HCL 4 MG/2ML IJ SOLN: 4.0000 mg | Freq: Four times a day (QID) | INTRAMUSCULAR | Status: DC | PRN

## 2018-03-23 NOTE — H&P (Signed)
History and Physical    Anne Black MEB:[id number] DOB: [birth year] DOA: 03/23/2018  PCP: System, Pcp Not In Consultants:  none Patient coming from: home- lives alone  Chief Complaint: Found down  HPI: Anne Black is a 58 y.o. female with medical history significant for chronic alcoholism, cirrhosis, bipolar disorder, COPD, ADHD, depression, paroxysmal atrial fibrillation who presented to the ED today after being found down in her home.  Patient was originally brought here to the ED under IVC from her family.  According to them she has completely stopped taking care of herself in her home.  She was found down in her house after she fell and was unable to get up for over 24 hours.  By report she continues to drink on a daily basis and had soiled her clothing and there was soiled clothing all around the house.  Current complaints are that she is mildly short of breath and nauseated.  She denies any pain or other complaints.  She denies palpitations, chest pain.  She does have a history of atrial fibrillation but does not take any of her medications for it nor does she take any of her other medications.  Initially she refused to be brought to the ED.  Upon arrival she denies any SI, HI or hallucinations.  She agreed to be hospitalized and treated.   Patient has been hospitalized multiple times in the last year for alcohol related illnesses, most commonly alcohol withdrawal but also hepatic encephalopathy and ascites.  ED Course: She was alert and oriented x3.  She was in A. fib with RVR with rates in the 150s to 160s.  Blood pressure was stable.  She was hypokalemic at 2.8.  CK level was normal.  No evidence of infection.  Review of Systems: As per HPI; otherwise review of systems reviewed and negative.   Ambulatory Status:  Ambulates without assistance  Past Medical History:  Diagnosis Date  . ADHD (attention deficit hyperactivity disorder)   . Alcoholism (HCC)   . Anxiety   . Arthritis     . Ascites   . Bipolar disorder (HCC)   . Cirrhosis (HCC)   . COPD (chronic obstructive pulmonary disease) (HCC)   . Hypokalemia   . Migraine   . Multiple sclerosis (HCC)   . Narcolepsy   . Osteoporosis     Past Surgical History:  Procedure Laterality Date  . ABDOMINAL WALL DEFECT REPAIR N/A 12/30/2016   Procedure: EXPLORATORY LAPAROTOMY WITH REPAIR ABDOMINAL WALL VENTRAL HERNIA;  Surgeon: [name], MD;  Location: [facility] OR;  Service: General;  Laterality: N/A;  . APPLICATION OF WOUND VAC N/A 12/30/2016   Procedure: APPLICATION OF WOUND VAC;  Surgeon: [name], MD;  Location: [facility] OR;  Service: General;  Laterality: N/A;  . CESAREAN SECTION  [phone]  . FRACTURE SURGERY    . HERNIA REPAIR    . MYRINGOTOMY WITH TUBE PLACEMENT Bilateral   . ORIF WRIST FRACTURE Left 09/09/2016   Procedure: OPEN REDUCTION INTERNAL FIXATION (ORIF) LEFT WRIST FRACTURE, LEFT CARPAL TUNNEL RELEASE;  Surgeon: Dominica Severin, MD;  Location: [facility] ORS;  Service: Orthopedics;  Laterality: Left;  . TONSILLECTOMY      Social History   Socioeconomic History  . Marital status: Divorced    Spouse name: n/a  . Number of children: 5  . Years of education: College  . Highest education level: Not on file  Occupational History  . Occupation: disabled    Comment: on social security disability for MS  Social Needs  . Financial resource strain: Somewhat hard  . Food insecurity:    Worry: Never true    Inability: Never true  . Transportation needs:    Medical: No    Non-medical: No  Tobacco Use  . Smoking status: Current Every Day Smoker    Packs/day: 1.00    Years: 35.00    Pack years: 35.00    Types: Cigarettes  . Smokeless tobacco: Never Used  Substance and Sexual Activity  . Alcohol use: Yes    Comment: 1 bottle of wine per day per patient.   . Drug use: No  . Sexual activity: Not Currently  Lifestyle  . Physical activity:    Days per week: 0 days    Minutes per session: 0 min  .  Stress: Rather much  Relationships  . Social connections:    Talks on phone: Three times a week    Gets together: Three times a week    Attends religious service: Never    Active member of club or organization: No    Attends meetings of clubs or organizations: Never    Relationship status: Divorced  . Intimate partner violence:    Fear of current or ex partner: Patient refused    Emotionally abused: Patient refused    Physically abused: Patient refused    Forced sexual activity: Patient refused  Other Topics Concern  . Not on file  Social History Narrative   Former Environmental consultantpharmaceutical representative.   Started graduate school in counseling, but didn't finish when a good job came open.   Lives alone.   One daughter lives in [place].   Another daughter is in [place]    The other three live in [place], along with her mother.   Father lives in the [place] area.    No Known Allergies  Family History  Problem Relation Age of Onset  . Arrhythmia Mother   . Heart disease Father   . Hypertension Father     Prior to Admission medications   Medication Sig Start Date End Date Taking? Authorizing Provider  pantoprazole (PROTONIX) 40 MG tablet Take 1 tablet (40 mg total) by mouth daily. 03/16/18  Yes Curatolo, Adam, DO  aluminum-magnesium hydroxide-simethicone (MAALOX) 200-200-20 MG/5ML SUSP Take 30 mLs by mouth 4 (four) times daily as needed (indigestion).     [provider]  azithromycin (ZITHROMAX) 250 MG tablet Take 1 tablet (250 mg total) by mouth daily. Take first 2 tablets together, then 1 every day until finished. Patient not taking: Reported on 03/23/2018 03/16/18   Virgina Norfolkuratolo, Adam, DO  chlordiazePOXIDE (LIBRIUM) 25 MG capsule 50mg  PO TID x 1D, then 25-50mg  PO BID X 1D, then 25-50mg  PO QD X 1D Patient not taking: Reported on 03/23/2018 03/17/18   Clydie BraunSmith, Rondell A, MD  FLUoxetine (PROZAC) 20 MG capsule Take 20 mg by mouth daily. 12/31/17   [provider]    Fluticasone-Salmeterol (WIXELA INHUB) 250-50 MCG/DOSE AEPB Inhale 1 puff into the lungs 2 (two) times daily.    [provider]  folic acid (FOLVITE) 1 MG tablet Take 1 tablet (1 mg total) by mouth daily. 03/17/18   Clydie BraunSmith, Rondell A, MD  furosemide (LASIX) 40 MG tablet Take 1 tablet (40 mg total) by mouth daily. 03/17/18   Clydie BraunSmith, Rondell A, MD  gabapentin (NEURONTIN) 100 MG capsule Take 2 capsules (200 mg total) by mouth 3 (three) times daily. 02/28/18   Charlynne PanderYao, David Hsienta, MD  hydrOXYzine (ATARAX/VISTARIL) 25 MG tablet Take 1  tablet (25 mg total) by mouth daily as needed (anxiety). 03/17/18   Clydie BraunSmith, Rondell A, MD  ipratropium-albuterol (DUONEB) 0.5-2.5 (3) MG/3ML SOLN Take 3 mLs by nebulization every 4 (four) hours as needed (For wheezing or shortness of breath.).    [provider]  lactulose (CHRONULAC) 10 GM/15ML solution Take 30 mLs (20 g total) by mouth 2 (two) times daily. 03/17/18   Clydie BraunSmith, Rondell A, MD  loperamide (IMODIUM A-D) 2 MG tablet Take 2 mg by mouth daily as needed for diarrhea or loose stools.     [provider]  neomycin-polymyxin-hydrocortisone (CORTISPORIN) OTIC solution Place 3 drops into both ears 3 (three) times daily for 10 days. 03/17/18 03/27/18  Madelyn FlavorsSmith, Rondell A, MD  nicotine (NICODERM CQ - DOSED IN MG/24 HOURS) 14 mg/24hr patch Place 1 patch onto the skin daily. 12/19/17   [provider]  ondansetron (ZOFRAN-ODT) 4 MG disintegrating tablet Take 4 mg by mouth every 8 (eight) hours as needed for nausea or vomiting.    [provider]  promethazine (PHENERGAN) 25 MG tablet Take 1 tablet (25 mg total) by mouth every 6 (six) hours as needed for nausea or vomiting. 03/17/18   Clydie BraunSmith, Rondell A, MD  rizatriptan (MAXALT) 10 MG tablet Take 10 mg by mouth as needed for migraine. May repeat in 2 hours if needed    [provider]  spironolactone (ALDACTONE) 25 MG tablet Take 1 tablet (25 mg total) by mouth daily for 30 days. 03/17/18 04/16/18   Clydie BraunSmith, Rondell A, MD  thiamine 100 MG tablet Take 1 tablet (100 mg total) by mouth daily. 03/17/18   Clydie BraunSmith, Rondell A, MD  VENTOLIN HFA 108 (90 Base) MCG/ACT inhaler Inhale 2 puffs into the lungs every 4 (four) hours as needed for wheezing or shortness of breath. 03/16/18   Virgina Norfolkuratolo, Adam, DO    Physical Exam: Vitals:   03/23/18 1600 03/23/18 1700 03/23/18 1730 03/23/18 1815  BP: (!) 119/56 110/63 118/68 126/82  Pulse: (!) 151     Resp: 16 18 20  (!) 25  SpO2: 92%        . General: Disheveled appearing, ill-appearing female in NAD . Eyes:  PERRL, EOMI, normal lids, iris . ENT:  grossly normal hearing, lips & tongue, mmm . Neck:  supple, no lymphadenopathy . Cardiovascular: Tachycardic, nL S1, S2, irregularly irregular rhythm, no murmur. Marland Kitchen. Respiratory:   CTA bilaterally with no wheezes/rales/rhonchi.  Normal respiratory effort. . Abdomen:  soft, NT, significantly distended, NABS . Back:   grossly normal alignment . Skin: Scattered telangiectasias, otherwise no rash . Musculoskeletal:  grossly normal tone BUE/BLE, good ROM, no bony abnormality or obvious joint deformity . Lower extremities: 1+ BLE edema.  Limited foot exam with no ulcerations.  2+ distal pulses. Marland Kitchen. Psychiatric:  grossly normal mood and affect, speech fluent and appropriate, AOx3 . Neurologic:  CN 2-12 grossly intact, moves all extremities in coordinated fashion, sensation intact, Patellar DTRs 2+ and symmetric    Radiological Exams on Admission: Ct Head Wo Contrast  Result Date: 03/23/2018 CLINICAL DATA:  Altered mental status EXAM: CT HEAD WITHOUT CONTRAST TECHNIQUE: Contiguous axial images were obtained from the base of the skull through the vertex without intravenous contrast. COMPARISON:  03/18/2018 FINDINGS: Brain: No evidence of acute infarction, hemorrhage, hydrocephalus, extra-axial collection or mass lesion/mass effect. Periventricular white matter hypodensity. Vascular: No hyperdense vessel or unexpected  calcification. Skull: Normal. Negative for fracture or focal lesion. Sinuses/Orbits: Redemonstrated displaced fracture of the right orbital floor, better evaluated by  recent dedicated CT examination of the facial bones. Other: None. IMPRESSION: 1. No acute intracranial pathology. Small-vessel white matter disease. 2. Redemonstrated displaced fracture of the right orbital floor, better evaluated by recent dedicated CT examination of the facial bones. Electronically Signed   By: Lauralyn Primes M.D.   On: 03/23/2018 16:41   Dg Chest Portable 1 View  Result Date: 03/23/2018 CLINICAL DATA:  Patient fell last evening. Bruising of the right eye. Dyspnea. EXAM: PORTABLE CHEST 1 VIEW COMPARISON:  03/19/2018 FINDINGS: The cardiopericardial silhouette is mildly enlarged but stable. Mild aortic atherosclerosis is noted at the arch without aneurysm. Chronic bronchitic change of the lungs without alveolar consolidation effusion or pneumothorax. IMPRESSION: Chronic bronchitic change of the lungs. Mild cardiomegaly with aortic atherosclerosis. Electronically Signed   By: Tollie Eth M.D.   On: 03/23/2018 16:53    EKG: Independently reviewed.   Date/Time:                  Thursday March 23 2018 15:25:01 EST Ventricular Rate:         153 PR Interval:                   QRS Duration: 82 QT Interval:                 274 QTC Calculation:        438 R Axis:                         78 Text Interpretation:       Atrial fibrillation Ventricular premature complex Repolarization abnormality, prob rate related Baseline wander in lead(s) V6 Since last EKG, AFib has replaced sinus rhythm Diffuse St-t changes, likely rate-related   Labs on Admission: I have personally reviewed the available labs and imaging studies at the time of the admission.  Pertinent labs:  Sodium 141 potassium 2.7 chloride 99 CO2 29 glucose 114 BUN less than 5 creatinine 0.44 magnesium 1.8 albumin 2.9 total protein 5.6 otherwise LFTs within normal  limits CK 147 Pending less than 0.03 WBC unremarkable, hemoglobin is at baseline at 10.5, platelets clumped, unable to estimate Blood alcohol level 75 INR 1.25, PT 15.6   Assessment/Plan Principal Problem:   Atrial fibrillation with rapid ventricular response (HCC) Active Problems:   GERD (gastroesophageal reflux disease)   Noncompliance with therapeutic plan   Bipolar 1 disorder (HCC)   Tobacco abuse   Alcohol use disorder, moderate, dependence (HCC)   Hypokalemia   Alcohol use disorder, severe, dependence (HCC)   COPD (chronic obstructive pulmonary disease) (HCC)  Atrial fibrillation with rapid ventricular response: Blood pressure is stable.  This is likely been brought on by excessive alcohol use and likely withdrawal as well as dehydration in the setting of being found down for more than 24 hours. -Admit to stepdown unit, continuous telemetry -Continue diltiazem drip and titrate off as heart rate allows.  Patient has not been on any home oral rate control medications as her atrial fibrillation has been paroxysmal and usually in the setting of alcohol withdrawal.  Alcohol use disorder, in acute withdrawal here  -Continue CIWA protocol -Continue multivitamin, folate, thiamine -She has a current IVC signed by the magistrate.  She will require a one-to-one sitter.  This is been ordered.  Cirrhosis due to alcoholic liver disease -Given her rapid A. fib and apparent mild dehydration, will hold her Lasix temporarily.  Will continue spironolactone given her hypokalemia. -Continue oral lactulose  Hypokalemia -She was given K-Dur 40 mEq in the ED.  30 mEq of potassium IV were ordered but patient was unable to tolerate due to burning in her IV.  Will order 1 more dose of K-Dur 40 mEq and recheck BMP in the morning -Continue spironolactone  Bipolar disorder, depression -Continue home Prozac  COPD, no exacerbation currently -Continue home fluticasone/salmeterol, duo nebs, Ventolin as  needed  GERD -Continue PPI    DVT prophylaxis: Lovenox Code Status:  Full Family Communication: None Disposition Plan:  Home once clinically improved Consults called: None; consider psych consult while admitted Admission status: Admit - It is my clinical opinion that admission to INPATIENT is reasonable and necessary because of the expectation that this patient will require hospital care that crosses at least 2 midnights to treat this condition based on the medical complexity of the problems presented.  Given the aforementioned information, the predictability of an adverse outcome is felt to be significant.     Elyse Hsu MD [facility]  If note is complete, please contact covering daytime or nighttime physician. [email or web address] Password [facility]  03/23/2018, 7:00 PM

## 2018-03-23 NOTE — ED Notes (Signed)
Attempted report x1. 

## 2018-03-23 NOTE — ED Notes (Signed)
Pt arrvies via EMS. Original call for EMS was for IVC transportation to ED due to failure to thrive. [employer] has IVC paperwork. While en route, EMS noticed pt's HR 160-170, Afib.BP 60/20. EMS gave NS, BP to follow was 90/50.  Pt has hx of paroxysmal Afib. Pt has had 3 falls in the last 24 hours. Pt has bruising to left eye/cheek from fall last week with fractures. Pt abd enlarged, hx of ascites. Abd tight- It was drained earlier this week approx 2 liters removed. Pt is alert and oriented. Denies SOB/CP. Pt complains of nausea. EMS gave 4mg  zofran.

## 2018-03-23 NOTE — ED Provider Notes (Signed)
[facility] Provider Note   CSN: [id number] Arrival date & time: 03/23/18  1518     History   Chief Complaint Chief Complaint  Patient presents with  . Atrial Fibrillation    HPI Anne Black is a 58 y.o. female.  HPI 58 year old female with past medical history as below including chronic alcoholism, cirrhosis, bipolar disorder, here with multiple issues.  Patient was sent here under IVC from her family.  She reportedly has stopped taking care of herself.  She was found down in her house after falling and being unable to get up for more than 24 hours.  She refused transport to the ED.  She reportedly had soiled clothes as well as soil clothing throughout her house.  She has a history of chronic alcoholism and drinks daily.  She currently states she feels mildly short of breath and nauseous, but denies any other complaints.  She is not late she had her head.  No headache.  No chest pain.  No palpitations.  She has a history of atrial fibrillation as well, but does not take any medications for it.  She does not take any of her daily medications.  She denies any SI, HI, or auditory or visual hallucinations.  No other complaints.  Past Medical History:  Diagnosis Date  . ADHD (attention deficit hyperactivity disorder)   . Alcoholism (HCC)   . Anxiety   . Arthritis   . Ascites   . Bipolar disorder (HCC)   . Cirrhosis (HCC)   . COPD (chronic obstructive pulmonary disease) (HCC)   . Hypokalemia   . Migraine   . Multiple sclerosis (HCC)   . Narcolepsy   . Osteoporosis     Patient Active Problem List   Diagnosis Date Noted  . Head injury   . Fall 03/19/2018  . Syncope 03/19/2018  . Orbital fracture 03/19/2018  . Scalp laceration 03/19/2018  . Acute respiratory failure with hypoxia and hypercarbia (HCC) 03/19/2018  . Abdominal tenderness 03/19/2018  . Acute on chronic respiratory failure with hypoxia (HCC) 03/17/2018  . Alcohol abuse with  intoxication (HCC) 03/17/2018  . ETOH abuse   . Normocytic anemia 03/16/2018  . Acute metabolic encephalopathy 03/16/2018  . Lower leg edema 03/16/2018  . Migraine 03/16/2018  . A-fib (HCC) 02/18/2018  . Community acquired pneumonia of left lower lobe of lung (HCC)   . Weakness 08/21/2017  . COPD exacerbation (HCC) 06/12/2017  . Chest pain with moderate risk for cardiac etiology 05/30/2017  . Abnormal LFTs 05/30/2017  . Tachycardia 05/30/2017  . COPD (chronic obstructive pulmonary disease) (HCC) 04/19/2017  . Right rib fracture 04/15/2017  . Abdominal wall cellulitis 12/30/2016  . S/P exploratory laparotomy 12/30/2016  . Distal radius fracture, left 09/09/2016  . Thrombocytopenia (HCC) 09/09/2016  . Chest x-ray abnormality   . Umbilical hernia without obstruction and without gangrene 05/12/2016  . Itching 02/24/2016  . Ventral hernia without obstruction or gangrene 02/24/2016  . Palliative care encounter   . Ascites   . Tachypnea   . Atrial flutter with rapid ventricular response (HCC)   . Hypomagnesemia   . Hypoxia   . Dehydration 01/22/2016  . Low back ache 12/30/2015  . Non-intractable vomiting with nausea   . Alcohol abuse with alcohol-induced mood disorder (HCC) 12/15/2015  . Acute alcoholism (HCC)   . Abdominal distension 11/04/2015  . Alcohol abuse   . Dyspnea   . Acute respiratory failure (HCC) 10/15/2015  . Ascites due to alcoholic  cirrhosis (HCC) 10/15/2015  . Osteoporosis   . Multiple sclerosis (HCC)   . Alcoholic cirrhosis of liver with ascites (HCC) 08/22/2015  . Bipolar disorder, current episode mixed, moderate (HCC) 08/01/2015  . Alcohol use disorder, severe, dependence (HCC) 07/31/2015  . Macrocytic anemia- due to alcohol abuse with normal B12 & folate levels 05/31/2015  . Severe protein-calorie malnutrition (HCC) 05/31/2015  . Hypokalemia 05/26/2015  . Encephalopathy, hepatic (HCC) 05/26/2015  . Alcohol withdrawal (HCC) 05/18/2015  . Abdominal pain  05/18/2015  . Anxiety 05/18/2015  . Severe sepsis (HCC) 05/18/2015  . UTI (lower urinary tract infection) 05/18/2015  . Stimulant abuse (HCC) 12/27/2014  . Nicotine abuse 12/27/2014  . Hyperprolactinemia (HCC) 11/15/2014  . Dyslipidemia   . Alcohol use disorder, moderate, dependence (HCC) 10/30/2014  . Tobacco abuse 01/23/2014  . Bipolar 1 disorder (HCC) 04/07/2013  . Noncompliance with therapeutic plan 04/04/2013  . Hereditary and idiopathic peripheral neuropathy 05/01/2012  . Depression 05/01/2012  . GERD (gastroesophageal reflux disease) 05/01/2012  . Osteoarthrosis, unspecified whether generalized or localized, involving lower leg 05/01/2012  . Hyponatremia 07/01/2011    Past Surgical History:  Procedure Laterality Date  . ABDOMINAL WALL DEFECT REPAIR N/A 12/30/2016   Procedure: EXPLORATORY LAPAROTOMY WITH REPAIR ABDOMINAL WALL VENTRAL HERNIA;  Surgeon: [name], MD;  Location: [facility] OR;  Service: General;  Laterality: N/A;  . APPLICATION OF WOUND VAC N/A 12/30/2016   Procedure: APPLICATION OF WOUND VAC;  Surgeon: [name], MD;  Location: [facility] OR;  Service: General;  Laterality: N/A;  . CESAREAN SECTION  [phone]  . FRACTURE SURGERY    . HERNIA REPAIR    . MYRINGOTOMY WITH TUBE PLACEMENT Bilateral   . ORIF WRIST FRACTURE Left 09/09/2016   Procedure: OPEN REDUCTION INTERNAL FIXATION (ORIF) LEFT WRIST FRACTURE, LEFT CARPAL TUNNEL RELEASE;  Surgeon: Dominica Severin, MD;  Location: [facility] ORS;  Service: Orthopedics;  Laterality: Left;  . TONSILLECTOMY       OB History   No obstetric history on file.      Home Medications    Prior to Admission medications   Medication Sig Start Date End Date Taking? Authorizing Provider  pantoprazole (PROTONIX) 40 MG tablet Take 1 tablet (40 mg total) by mouth daily. 03/16/18  Yes Curatolo, Adam, DO  aluminum-magnesium hydroxide-simethicone (MAALOX) 200-200-20 MG/5ML SUSP Take 30 mLs by mouth 4 (four) times daily as needed  (indigestion).     [provider]  azithromycin (ZITHROMAX) 250 MG tablet Take 1 tablet (250 mg total) by mouth daily. Take first 2 tablets together, then 1 every day until finished. 03/16/18   Curatolo, Adam, DO  chlordiazePOXIDE (LIBRIUM) 25 MG capsule 50mg  PO TID x 1D, then 25-50mg  PO BID X 1D, then 25-50mg  PO QD X 1D 03/17/18   Smith, Rondell A, MD  FLUoxetine (PROZAC) 20 MG capsule Take 20 mg by mouth daily. 12/31/17   [provider]  Fluticasone-Salmeterol (WIXELA INHUB) 250-50 MCG/DOSE AEPB Inhale 1 puff into the lungs 2 (two) times daily.    [provider]  folic acid (FOLVITE) 1 MG tablet Take 1 tablet (1 mg total) by mouth daily. 03/17/18   Clydie Braun, MD  furosemide (LASIX) 40 MG tablet Take 1 tablet (40 mg total) by mouth daily. 03/17/18   Clydie Braun, MD  gabapentin (NEURONTIN) 100 MG capsule Take 2 capsules (200 mg total) by mouth 3 (three) times daily. Patient taking differently: Take 300 mg by mouth 3 (three) times daily.  02/28/18   Charlynne Pander,  MD  hydrocortisone cream 1 % Apply topically 3 (three) times daily. Patient taking differently: Apply 1 application topically daily as needed for itching.  04/20/17   Marguerita Merles Latif, DO  hydrOXYzine (ATARAX/VISTARIL) 25 MG tablet Take 1 tablet (25 mg total) by mouth daily as needed (anxiety). 03/17/18   Clydie Braun, MD  ipratropium-albuterol (DUONEB) 0.5-2.5 (3) MG/3ML SOLN Take 3 mLs by nebulization every 4 (four) hours as needed (For wheezing or shortness of breath.).    [provider]  lactulose (CHRONULAC) 10 GM/15ML solution Take 30 mLs (20 g total) by mouth 2 (two) times daily. 03/17/18   Clydie Braun, MD  loperamide (IMODIUM A-D) 2 MG tablet Take 2 mg by mouth daily as needed for diarrhea or loose stools.     [provider]  neomycin-polymyxin-hydrocortisone (CORTISPORIN) OTIC solution Place 3 drops into both ears 3 (three) times daily for 10 days. 03/17/18 03/27/18   Madelyn Flavors A, MD  nicotine (NICODERM CQ - DOSED IN MG/24 HOURS) 14 mg/24hr patch Place 1 patch onto the skin daily. 12/19/17   [provider]  ondansetron (ZOFRAN-ODT) 4 MG disintegrating tablet Take 4 mg by mouth every 8 (eight) hours as needed for nausea or vomiting.    [provider]  promethazine (PHENERGAN) 25 MG tablet Take 1 tablet (25 mg total) by mouth every 6 (six) hours as needed for nausea or vomiting. 03/17/18   Clydie Braun, MD  rizatriptan (MAXALT) 10 MG tablet Take 10 mg by mouth as needed for migraine. May repeat in 2 hours if needed    [provider]  spironolactone (ALDACTONE) 25 MG tablet Take 1 tablet (25 mg total) by mouth daily for 30 days. 03/17/18 04/16/18  Clydie Braun, MD  thiamine 100 MG tablet Take 1 tablet (100 mg total) by mouth daily. 03/17/18   Clydie Braun, MD  VENTOLIN HFA 108 (90 Base) MCG/ACT inhaler Inhale 2 puffs into the lungs every 4 (four) hours as needed for wheezing or shortness of breath. 03/16/18   Virgina Norfolk, DO    Family History Family History  Problem Relation Age of Onset  . Arrhythmia Mother   . Heart disease Father   . Hypertension Father     Social History Social History   Tobacco Use  . Smoking status: Current Every Day Smoker    Packs/day: 1.00    Years: 35.00    Pack years: 35.00    Types: Cigarettes  . Smokeless tobacco: Never Used  Substance Use Topics  . Alcohol use: Yes    Comment: 1 bottle of wine per day per patient.   . Drug use: No     Allergies   Patient has no known allergies.   Review of Systems Review of Systems  Constitutional: Positive for fatigue. Negative for chills and fever.  HENT: Negative for congestion and rhinorrhea.   Eyes: Negative for visual disturbance.  Respiratory: Positive for chest tightness and shortness of breath. Negative for cough and wheezing.   Cardiovascular: Positive for palpitations. Negative for chest pain and leg swelling.    Gastrointestinal: Negative for abdominal pain, diarrhea, nausea and vomiting.  Genitourinary: Negative for dysuria and flank pain.  Musculoskeletal: Negative for neck pain and neck stiffness.  Skin: Negative for rash and wound.  Allergic/Immunologic: Negative for immunocompromised state.  Neurological: Positive for weakness. Negative for syncope and headaches.  All other systems reviewed and are negative.    Physical Exam Updated Vital Signs BP (!) 119/56  Pulse (!) 151   Resp 16   SpO2 92%   Physical Exam Vitals signs and nursing note reviewed.  Constitutional:      General: She is not in acute distress.    Appearance: She is well-developed.     Comments: Disheveled, appears older than stated age  HENT:     Head: Normocephalic and atraumatic.  Eyes:     Conjunctiva/sclera: Conjunctivae normal.  Neck:     Musculoskeletal: Neck supple.  Cardiovascular:     Rate and Rhythm: Tachycardia present. Rhythm irregularly irregular.     Heart sounds: Normal heart sounds. No murmur. No friction rub.  Pulmonary:     Effort: Pulmonary effort is normal. No respiratory distress.     Breath sounds: Normal breath sounds. No wheezing or rales.  Abdominal:     General: There is distension.     Palpations: Abdomen is soft.     Tenderness: There is no abdominal tenderness.  Skin:    General: Skin is warm.     Capillary Refill: Capillary refill takes less than 2 seconds.  Neurological:     Mental Status: She is alert and oriented to person, place, and time.     Motor: No abnormal muscle tone.      ED Treatments / Results  Labs (all labs ordered are listed, but only abnormal results are displayed) Labs Reviewed  CBC WITH DIFFERENTIAL/PLATELET - Abnormal; Notable for the following components:      Result Value   Hemoglobin 10.5 (*)    HCT 35.8 (*)    MCH 24.7 (*)    MCHC 29.3 (*)    RDW 15.9 (*)    All other components within normal limits  COMPREHENSIVE METABOLIC PANEL -  Abnormal; Notable for the following components:   Potassium 2.7 (*)    Glucose, Bld 114 (*)    BUN <5 (*)    Calcium 7.8 (*)    Total Protein 5.6 (*)    Albumin 2.9 (*)    All other components within normal limits  ETHANOL - Abnormal; Notable for the following components:   Alcohol, Ethyl (B) 75 (*)    All other components within normal limits  PROTIME-INR - Abnormal; Notable for the following components:   Prothrombin Time 15.6 (*)    All other components within normal limits  AMMONIA  CK  MAGNESIUM  TROPONIN I  URINALYSIS, ROUTINE W REFLEX MICROSCOPIC    EKG EKG Interpretation  Date/Time:  Thursday March 23 2018 15:25:01 EST Ventricular Rate:  153 PR Interval:    QRS Duration: 82 QT Interval:  274 QTC Calculation: 438 R Axis:   78 Text Interpretation:  Atrial fibrillation Ventricular premature complex Repolarization abnormality, prob rate related Baseline wander in lead(s) V6 Since last EKG, AFib has replaced sinus rhythm Diffuse St-t changes, likely rate-related Confirmed by Shaune Pollack [id number]) on 03/23/2018 3:48:33 PM   Radiology Ct Head Wo Contrast  Result Date: 03/23/2018 CLINICAL DATA:  Altered mental status EXAM: CT HEAD WITHOUT CONTRAST TECHNIQUE: Contiguous axial images were obtained from the base of the skull through the vertex without intravenous contrast. COMPARISON:  03/18/2018 FINDINGS: Brain: No evidence of acute infarction, hemorrhage, hydrocephalus, extra-axial collection or mass lesion/mass effect. Periventricular white matter hypodensity. Vascular: No hyperdense vessel or unexpected calcification. Skull: Normal. Negative for fracture or focal lesion. Sinuses/Orbits: Redemonstrated displaced fracture of the right orbital floor, better evaluated by recent dedicated CT examination of the facial bones. Other: None. IMPRESSION: 1. No acute intracranial pathology. Small-vessel  white matter disease. 2. Redemonstrated displaced fracture of the right orbital  floor, better evaluated by recent dedicated CT examination of the facial bones. Electronically Signed   By: Lauralyn Primes M.D.   On: 03/23/2018 16:41   Dg Chest Portable 1 View  Result Date: 03/23/2018 CLINICAL DATA:  Patient fell last evening. Bruising of the right eye. Dyspnea. EXAM: PORTABLE CHEST 1 VIEW COMPARISON:  03/19/2018 FINDINGS: The cardiopericardial silhouette is mildly enlarged but stable. Mild aortic atherosclerosis is noted at the arch without aneurysm. Chronic bronchitic change of the lungs without alveolar consolidation effusion or pneumothorax. IMPRESSION: Chronic bronchitic change of the lungs. Mild cardiomegaly with aortic atherosclerosis. Electronically Signed   By: Tollie Eth M.D.   On: 03/23/2018 16:53    Procedures .Critical Care Performed by: Shaune Pollack, MD Authorized by: Shaune Pollack, MD   Critical care provider statement:    Critical care time (minutes):  35   Critical care time was exclusive of:  Separately billable procedures and treating other patients and teaching time   Critical care was necessary to treat or prevent imminent or life-threatening deterioration of the following conditions:  Circulatory failure, cardiac failure and metabolic crisis   Critical care was time spent personally by me on the following activities:  Development of treatment plan with patient or surrogate, discussions with consultants, evaluation of patient's response to treatment, examination of patient, obtaining history from patient or surrogate, ordering and performing treatments and interventions, ordering and review of laboratory studies, ordering and review of radiographic studies, pulse oximetry, re-evaluation of patient's condition and review of old charts   I assumed direction of critical care for this patient from another provider in my specialty: no     (including critical care time)  Medications Ordered in ED Medications  diltiazem (CARDIZEM) 1 mg/mL load via infusion  10 mg (10 mg Intravenous Bolus from Bag 03/23/18 1613)    And  diltiazem (CARDIZEM) 100 mg in dextrose 5% (1 mg/mL) infusion (5 mg/hr Intravenous New Bag/Given 03/23/18 1613)  potassium chloride 10 mEq in 100 mL IVPB (has no administration in time range)  magnesium sulfate IVPB 2 g 50 mL (has no administration in time range)  LORazepam (ATIVAN) injection 1 mg (has no administration in time range)  LORazepam (ATIVAN) injection 2-3 mg (has no administration in time range)  folic acid (FOLVITE) tablet 1 mg (has no administration in time range)  multivitamin with minerals tablet 1 tablet (has no administration in time range)  thiamine (VITAMIN B-1) tablet 100 mg (has no administration in time range)  sodium chloride 0.9 % bolus 500 mL (0 mLs Intravenous Stopped 03/23/18 1637)  LORazepam (ATIVAN) injection 1 mg (1 mg Intravenous Given 03/23/18 1635)  potassium chloride SA (K-DUR,KLOR-CON) CR tablet 40 mEq (40 mEq Oral Given 03/23/18 1656)     Initial Impression / Assessment and Plan / ED Course  I have reviewed the triage vital signs and the nursing notes.  Pertinent labs & imaging results that were available during my care of the patient were reviewed by me and considered in my medical decision making (see chart for details).     58 yo F here with generalized weakness, falls, under IVC from family.  Patient noted to be in A. fib RVR which I suspect is due to a combination of dehydration as well as electrolyte abnormalities secondary to alcohol binge and being down for 24 hours.  No evidence of rhabdomyolysis.  She denies any chest pain EKG does not show  any ST segment changes.  She does not appear overtly encephalopathic and ammonia is normal.  She is not intoxicated currently.  There could also be component of alcohol withdrawal she could not drink because she was down.  Ativan given.  Patient started on diltiazem bolus and drip for A. fib RVR and will replace her electrolytes.  Thiamine, folic  acid also given.  Regarding her IVC, she is awake, alert, and denies any SI, HI, or auditory visual hallucinations at this time.  She has a history of bipolar disorder but does not appear overtly manic at this time.  While she does have significant underlying substance abuse issues, I do not necessarily think she meets IVC criteria at this time.  Currently, she is amenable to staying in the hospital.  Final Clinical Impressions(s) / ED Diagnoses   Final diagnoses:  Atrial fibrillation with RVR (HCC)  Hypokalemia  Alcohol withdrawal syndrome without complication [facility])    ED Discharge Orders    None       Shaune Pollack, MD 03/23/18 1658

## 2018-03-24 DIAGNOSIS — F1020 Alcohol dependence, uncomplicated: Secondary | ICD-10-CM

## 2018-03-24 LAB — BASIC METABOLIC PANEL
Anion gap: 9 (ref 5–15)
BUN: <5 mg/dL — ABNORMAL LOW (ref 6–20)
CO2: 31 mmol/L (ref 22–32)
Calcium: 8 mg/dL — ABNORMAL LOW (ref 8.9–10.3)
Chloride: 99 mmol/L (ref 98–111)
Creatinine, Ser: 0.51 mg/dL (ref 0.44–1.00)
GFR calc Af Amer: >60 mL/min (ref >60)
GFR calc non Af Amer: >60 mL/min (ref >60)
Glucose, Bld: 106 mg/dL — ABNORMAL HIGH (ref 70–99)
Potassium: 3.7 mmol/L (ref 3.5–5.1)
Sodium: 139 mmol/L (ref 135–145)

## 2018-03-24 LAB — CBC
HCT: 29.7 % — ABNORMAL LOW (ref 36.0–46.0)
Hemoglobin: 8.6 g/dL — ABNORMAL LOW (ref 12.0–15.0)
MCH: 24.2 pg — ABNORMAL LOW (ref 26.0–34.0)
MCHC: 29 g/dL — ABNORMAL LOW (ref 30.0–36.0)
MCV: 83.4 fL (ref 80.0–100.0)
Platelets: 105 10*3/uL — ABNORMAL LOW (ref 150–400)
RBC: 3.56 MIL/uL — ABNORMAL LOW (ref 3.87–5.11)
RDW: 16 % — ABNORMAL HIGH (ref 11.5–15.5)
WBC: 5.4 10*3/uL (ref 4.0–10.5)
nRBC: 0 % (ref 0.0–0.2)

## 2018-03-24 LAB — CULTURE, BODY FLUID W GRAM STAIN -BOTTLE: Culture: NO GROWTH

## 2018-03-24 MED ORDER — GUAIFENESIN 100 MG/5ML PO SOLN: 10.0000 mL | ORAL | Status: DC | PRN

## 2018-03-24 MED ORDER — DILTIAZEM HCL 60 MG PO TABS
30.0000 mg | ORAL_TABLET | Freq: Four times a day (QID) | ORAL | Status: DC
  Administered 2018-03-24 – 2018-03-25 (×4): 30 mg via ORAL
  Filled 2018-03-24 (×4): qty 1

## 2018-03-24 MED ORDER — ACETAMINOPHEN 325 MG PO TABS
650.0000 mg | ORAL_TABLET | Freq: Four times a day (QID) | ORAL | Status: DC | PRN
  Administered 2018-03-24 – 2018-03-28 (×9): 650 mg via ORAL
  Filled 2018-03-24 (×9): qty 2

## 2018-03-24 MED ORDER — SUMATRIPTAN SUCCINATE 50 MG PO TABS
50.0000 mg | ORAL_TABLET | ORAL | Status: AC | PRN
  Administered 2018-03-24 (×2): 50 mg via ORAL
  Filled 2018-03-24 (×3): qty 1
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
## 2018-03-24 NOTE — Consult Note (Addendum)
[facility] Face-to-Face Psychiatry Consult   Reason for Consult:  Depression and not caring for self. Referring Physician:  Dr. Allena Katz  Patient Identification: NATELIE OSTROSKY MRN:  [id number] Principal Diagnosis: Alcohol use disorder, severe, dependence (HCC) Diagnosis:  Principal Problem:   Atrial fibrillation with rapid ventricular response (HCC) Active Problems:   GERD (gastroesophageal reflux disease)   Noncompliance with therapeutic plan   Bipolar 1 disorder (HCC)   Tobacco abuse   Alcohol use disorder, moderate, dependence (HCC)   Hypokalemia   Alcohol use disorder, severe, dependence (HCC)   COPD (chronic obstructive pulmonary disease) (HCC)   Total Time spent with patient: 1 hour  Subjective:   BREEZIE MICUCCI is a 58 y.o. female patient admitted with altered mental status.  HPI:   Per chart review, patient was admitted with altered mental status. She was found down at home. She drinks daily. Family reports that she has not been caring for herself and she is depressed. She has soiled her clothing. Her hospital course has been complicated by atrial fibrillation with RVR and alcohol withdrawal. BAL was 75 on admission. Home medications include Prozac 20 mg daily and Gabapentin 300 mg TID. She received Ativan 4 mg and Trazodone 50 mg overnight.   Of note, patient was last seen by the psychiatry consult service on 12/12 for management of bipolar disorder and alcohol abuse. She was psychiatrically cleared and determined to have capacity to make the decision to discharge home.   On interview, Ms. Mcqueen reports, "My daughter always does this."  She refers to being placed under IVC to be brought to the hospital.  She admits to problematic alcohol use.  She has been drinking 1.5 bottles of wine daily for the past 2 weeks.  She reports that she has been researching rehab facilities in [place].  She reports problematic alcohol use for the past 3-4 years.  She denies illicit substance use.  She  denies mood symptoms.  She reports that her mood has been stable.  She denies SI, HI or AVH.  She denies problems with sleep or appetite.  She reports compliance with her medications.  She does not currently have a psychiatrist in the local area and is still receiving refills from the treatment facility in [place].  She reports needing assistance with cleaning and running errands.  She has a home aide that assists her through a private company.  She reports frequent falls which she attributes to multiple sclerosis although she was informed that her falls are also likely attributed to alcohol use.  Past Psychiatric History: Alcohol abuse, BPAD, ADHD, anxiety and depression.   Risk to Self:  None. Denies SI.  Risk to Others:  None. Denies HI. Prior Inpatient Therapy:  She was hospitalized multiple times for detox.  Prior Outpatient Therapy:  Her medications are managed by a mental health provider in [place]. Prior medications include Nuvigil for narcolepsy and Vyvanse.   Past Medical History:  Past Medical History:  Diagnosis Date  . ADHD (attention deficit hyperactivity disorder)   . Alcoholism (HCC)   . Anxiety   . Arthritis   . Ascites   . Bipolar disorder (HCC)   . Cirrhosis (HCC)   . COPD (chronic obstructive pulmonary disease) (HCC)   . Hypokalemia   . Migraine   . Multiple sclerosis (HCC)   . Narcolepsy   . Osteoporosis     Past Surgical History:  Procedure Laterality Date  . ABDOMINAL WALL DEFECT REPAIR N/A 12/30/2016   Procedure: EXPLORATORY  LAPAROTOMY WITH REPAIR ABDOMINAL WALL VENTRAL HERNIA;  Surgeon: Emelia LoronWakefield, Matthew, MD;  [facility];  Service: General;  Laterality: N/A;  . APPLICATION OF WOUND VAC N/A 12/30/2016   Procedure: APPLICATION OF WOUND VAC;  Surgeon: Emelia LoronWakefield, Matthew, MD;  [facility];  Service: General;  Laterality: N/A;  . CESAREAN SECTION  [phone]  . FRACTURE SURGERY    . HERNIA REPAIR    . MYRINGOTOMY WITH TUBE PLACEMENT Bilateral   . ORIF WRIST  FRACTURE Left 09/09/2016   Procedure: OPEN REDUCTION INTERNAL FIXATION (ORIF) LEFT WRIST FRACTURE, LEFT CARPAL TUNNEL RELEASE;  Surgeon: Dominica SeverinGramig, William, MD;  Location: [facility] ORS;  Service: Orthopedics;  Laterality: Left;  . TONSILLECTOMY     Family History:  Family History  Problem Relation Age of Onset  . Arrhythmia Mother   . Heart disease Father   . Hypertension Father    Family Psychiatric  History: Father-alcoholism  Social History:  Social History   Substance and Sexual Activity  Alcohol Use Yes   Comment: 1 bottle of wine per day per patient.      Social History   Substance and Sexual Activity  Drug Use No    Social History   Socioeconomic History  . Marital status: Divorced    Spouse name: n/a  . Number of children: 5  . Years of education: College  . Highest education level: Not on file  Occupational History  . Occupation: disabled    Comment: on social security disability for MS  Social Needs  . Financial resource strain: Somewhat hard  . Food insecurity:    Worry: Never true    Inability: Never true  . Transportation needs:    Medical: No    Non-medical: No  Tobacco Use  . Smoking status: Current Every Day Smoker    Packs/day: 1.00    Years: 35.00    Pack years: 35.00    Types: Cigarettes  . Smokeless tobacco: Never Used  Substance and Sexual Activity  . Alcohol use: Yes    Comment: 1 bottle of wine per day per patient.   . Drug use: No  . Sexual activity: Not Currently  Lifestyle  . Physical activity:    Days per week: 0 days    Minutes per session: 0 min  . Stress: Rather much  Relationships  . Social connections:    Talks on phone: Three times a week    Gets together: Three times a week    Attends religious service: Never    Active member of club or organization: No    Attends meetings of clubs or organizations: Never    Relationship status: Divorced  Other Topics Concern  . Not on file  Social History Narrative   Former Web designerpharmaceutical  representative.   Started graduate school in counseling, but didn't finish when a good job came open.   Lives alone.   One daughter lives in [place].   Another daughter is in [place]    The other three live in [place], along with her mother.   Father lives in the [place] area.   Additional Social History: She lives alone. She has 5 adult children. She receives disability for multiple sclerosis. She has a history of heavy alcohol use. She denies illicit substance use.     Allergies:  No Known Allergies  Labs:  Results for orders placed or performed during the hospital encounter of 03/23/18 (from the past 48 hour(s))  CBC with Differential     Status:  Abnormal   Collection Time: 03/23/18  3:49 PM  Result Value Ref Range   WBC 7.4 4.0 - 10.5 K/uL   RBC 4.25 3.87 - 5.11 MIL/uL   Hemoglobin 10.5 (L) 12.0 - 15.0 g/dL   HCT 16.1 (L) 09.6 - 04.5 %   MCV 84.2 80.0 - 100.0 fL   MCH 24.7 (L) 26.0 - 34.0 pg   MCHC 29.3 (L) 30.0 - 36.0 g/dL   RDW 40.9 (H) 81.1 - 91.4 %   Platelets PLATELET CLUMPS NOTED ON SMEAR, UNABLE TO ESTIMATE 150 - 400 K/uL   nRBC 0.0 0.0 - 0.2 %   Neutrophils Relative % 73 %   Neutro Abs 5.4 1.7 - 7.7 K/uL   Lymphocytes Relative 13 %   Lymphs Abs 1.0 0.7 - 4.0 K/uL   Monocytes Relative 12 %   Monocytes Absolute 0.9 0.1 - 1.0 K/uL   Eosinophils Relative 2 %   Eosinophils Absolute 0.2 0.0 - 0.5 K/uL   Basophils Relative 0 %   Basophils Absolute 0.0 0.0 - 0.1 K/uL   RBC Morphology MORPHOLOGY UNREMARKABLE    Immature Granulocytes 0 %   Abs Immature Granulocytes 0.03 0.00 - 0.07 K/uL    Comment: Performed at [facility], [address] [place], [place] [address]  Comprehensive metabolic panel     Status: Abnormal   Collection Time: 03/23/18  3:49 PM  Result Value Ref Range   Sodium 141 135 - 145 mmol/L   Potassium 2.7 (LL) 3.5 - 5.1 mmol/L    Comment: CRITICAL RESULT CALLED TO, READ BACK BY AND VERIFIED WITH: H DOOLEY,RN 1639 03/23/2018 WBOND     Chloride 99 98 - 111 mmol/L   CO2 29 22 - 32 mmol/L   Glucose, Bld 114 (H) 70 - 99 mg/dL   BUN <5 (L) 6 - 20 mg/dL   Creatinine, Ser 6.21 0.44 - 1.00 mg/dL   Calcium 7.8 (L) 8.9 - 10.3 mg/dL   Total Protein 5.6 (L) 6.5 - 8.1 g/dL   Albumin 2.9 (L) 3.5 - 5.0 g/dL   AST 39 15 - 41 U/L   ALT 25 0 - 44 U/L   Alkaline Phosphatase 92 38 - 126 U/L   Total Bilirubin 0.7 0.3 - 1.2 mg/dL   GFR calc non Af Amer >60 >60 mL/min   GFR calc Af Amer >60 >60 mL/min   Anion gap 13 5 - 15    Comment: Performed at [facility], [address] [place], [place] [address]  Ammonia     Status: None   Collection Time: 03/23/18  3:49 PM  Result Value Ref Range   Ammonia 28 9 - 35 umol/L    Comment: Performed at [facility], [address], [place], [place] [address]  CK     Status: None   Collection Time: 03/23/18  3:49 PM  Result Value Ref Range   Total CK 147 38 - 234 U/L    Comment: Performed at [facility], [address] [place], [place] [address]  Ethanol     Status: Abnormal   Collection Time: 03/23/18  3:49 PM  Result Value Ref Range   Alcohol, Ethyl (B) 75 (H) <10 mg/dL    Comment: (NOTE) Lowest detectable limit for serum alcohol is 10 mg/dL. For medical purposes only. Performed at [facility], [address], [place], [place] [address]   Protime-INR     Status: Abnormal   Collection Time:  03/23/18  3:49 PM  Result Value Ref Range   Prothrombin Time 15.6 (H) 11.4 - 15.2 seconds   INR 1.25     Comment: Performed at [facility], [address], [place], [place] [address]  Magnesium     Status: None   Collection Time: 03/23/18  3:49 PM  Result Value Ref Range   Magnesium 1.8 1.7 - 2.4 mg/dL    Comment: Performed at [facility], [address], [place], [place] [address]  Troponin I - ONCE - STAT     Status: None   Collection Time: 03/23/18  3:49 PM  Result Value Ref Range   Troponin I <0.03 <0.03 ng/mL    Comment: Performed at [facility], [address], [place], [place] [address]  Basic metabolic panel     Status: Abnormal   Collection Time: 03/24/18  3:21 AM  Result Value Ref Range   Sodium 139 135 - 145 mmol/L   Potassium 3.7 3.5 - 5.1 mmol/L   Chloride 99 98 - 111 mmol/L   CO2 31 22 - 32 mmol/L   Glucose, Bld 106 (H) 70 - 99 mg/dL   BUN <5 (L) 6 - 20 mg/dL   Creatinine, Ser 8.29 0.44 - 1.00 mg/dL   Calcium 8.0 (L) 8.9 - 10.3 mg/dL   GFR calc non Af Amer >60 >60 mL/min   GFR calc Af Amer >60 >60 mL/min   Anion gap 9 5 - 15    Comment: Performed at [facility], [address], [place], [place] [address]  CBC     Status: Abnormal   Collection Time: 03/24/18  3:21 AM  Result Value Ref Range   WBC 5.4 4.0 - 10.5 K/uL   RBC 3.56 (L) 3.87 - 5.11 MIL/uL   Hemoglobin 8.6 (L) 12.0 - 15.0 g/dL   HCT 08.6 (L) 57.8 - 46.9 %   MCV 83.4 80.0 - 100.0 fL   MCH 24.2 (L) 26.0 - 34.0 pg   MCHC 29.0 (L) 30.0 - 36.0 g/dL   RDW 62.9 (H) 52.8 - 41.3 %   Platelets 105 (L) 150 - 400 K/uL    Comment: REPEATED TO VERIFY PLATELET COUNT CONFIRMED BY SMEAR SPECIMEN CHECKED FOR CLOTS    nRBC 0.0 0.0 - 0.2 %    Comment: Performed at [facility], [address] [place], [place] [address]    Current Facility-Administered Medications  Medication Dose Route Frequency Provider Last Rate Last Dose  . acetaminophen (TYLENOL) tablet 650 mg  650 mg Oral Q6H PRN Rolly Salter, MD      . albuterol (PROVENTIL) (2.5 MG/3ML) 0.083% nebulizer solution 2.5 mg  2.5 mg Nebulization Q4H PRN Elyse Hsu, MD      . alum & mag hydroxide-simeth (MAALOX/MYLANTA) 200-200-20 MG/5ML suspension 30 mL  30 mL Oral QID PRN Elyse Hsu, MD      . bisacodyl (DULCOLAX) suppository 10 mg  10 mg Rectal Daily PRN Elyse Hsu, MD      . diltiazem (CARDIZEM) tablet 30 mg  30 mg Oral Q6H Rolly Salter, MD   30 mg at 03/24/18 0912  . docusate sodium (COLACE) capsule 100 mg  100 mg Oral BID PRN Elyse Hsu, MD      . enoxaparin  (LOVENOX) injection 40 mg  40 mg Subcutaneous Q24H Elyse Hsu, MD   40 mg at 03/23/18 2202  . FLUoxetine (PROZAC) capsule 20  mg  20 mg Oral Daily Elyse Hsu, MD   20 mg at 03/24/18 1657  . folic acid (FOLVITE) tablet 1 mg  1 mg Oral Daily Elyse Hsu, MD   1 mg at 03/24/18 9038  . gabapentin (NEURONTIN) capsule 300 mg  300 mg Oral TID Elyse Hsu, MD   300 mg at 03/24/18 3338  . guaiFENesin (ROBITUSSIN) 100 MG/5ML solution 200 mg  10 mL Oral Q4H PRN Rolly Salter, MD      . hydrocortisone cream 1 % 1 application  1 application Topical Daily PRN Elyse Hsu, MD      . hydrOXYzine (ATARAX/VISTARIL) tablet 25 mg  25 mg Oral Daily PRN Elyse Hsu, MD   25 mg at 03/23/18 2206  . ipratropium-albuterol (DUONEB) 0.5-2.5 (3) MG/3ML nebulizer solution 3 mL  3 mL Nebulization Q4H PRN Elyse Hsu, MD      . lactulose (CHRONULAC) 10 GM/15ML solution 20 g  20 g Oral BID Elyse Hsu, MD      . LORazepam (ATIVAN) tablet 2 mg  2 mg Oral Q6H PRN Elyse Hsu, MD       Or  . LORazepam (ATIVAN) injection 2 mg  2 mg Intravenous Q6H PRN Elyse Hsu, MD      . LORazepam (ATIVAN) injection 2-3 mg  2-3 mg Intravenous Q1H PRN Elyse Hsu, MD   2 mg at 03/23/18 2249  . mometasone-formoterol (DULERA) 200-5 MCG/ACT inhaler 2 puff  2 puff Inhalation BID Elyse Hsu, MD   2 puff at 03/24/18 0912  . multivitamin with minerals tablet 1 tablet  1 tablet Oral Daily Elyse Hsu, MD   1 tablet at 03/24/18 3291  . neomycin-polymyxin-hydrocortisone (CORTISPORIN) OTIC (EAR) solution 3 drop  3 drop Both EARS TID Elyse Hsu, MD   3 drop at 03/24/18 0912  . nicotine (NICODERM CQ - dosed in mg/24 hours) patch 21 mg  21 mg Transdermal Daily Bodenheimer, Charles A, NP   21 mg at 03/24/18 0904  . ondansetron (ZOFRAN-ODT) disintegrating tablet 4 mg  4 mg Oral Q8H PRN Elyse Hsu, MD      . pantoprazole (PROTONIX) EC tablet 40 mg  40 mg Oral Daily Elyse Hsu, MD    40 mg at 03/24/18 9166  . promethazine (PHENERGAN) tablet 25 mg  25 mg Oral Q6H PRN Elyse Hsu, MD   25 mg at 03/23/18 2203  . thiamine (VITAMIN B-1) tablet 100 mg  100 mg Oral Daily Elyse Hsu, MD   100 mg at 03/24/18 0600    Musculoskeletal: Strength & Muscle Tone: within normal limits Gait & Station: UTA since patient is lying in bed. Patient leans: N/A  Psychiatric Specialty Exam: Physical Exam  Nursing note and vitals reviewed. Constitutional: She is oriented to person, place, and time. She appears well-developed and well-nourished.  HENT:  Head: Normocephalic and atraumatic.  Neck: Normal range of motion.  Respiratory: Effort normal.  Musculoskeletal: Normal range of motion.  Neurological: She is alert and oriented to person, place, and time.  Psychiatric: She has a normal mood and affect. Her speech is normal and behavior is normal. Judgment and thought content normal. Cognition and memory are normal.    Review of Systems  HENT: Positive for ear pain (right) and sore throat.   Gastrointestinal: Positive for nausea. Negative for constipation, diarrhea and vomiting.  Psychiatric/Behavioral: Positive for substance abuse. Negative for depression, hallucinations and suicidal  ideas. The patient is not nervous/anxious and does not have insomnia.   All other systems reviewed and are negative.   Blood pressure (!) 118/52, pulse 76, temperature 97.6 F (36.4 C), temperature source Oral, resp. rate 13, height 5\' 2"  (1.575 m), weight 84.8 kg, SpO2 92 %.Body mass index is 34.19 kg/m.  General Appearance: Fairly Groomed, middle aged, Caucasian female, wearing a hospital gown with long, brown hair, hematoma of right eye and [place] in place who is lying in bed. NAD.    Eye Contact:  Good  Speech:  Clear and Coherent and Normal Rate  Volume:  Normal  Mood:  Euthymic  Affect:  Appropriate and Congruent  Thought Process:  Goal Directed, Linear and Descriptions of Associations:  Intact  Orientation:  Full (Time, Place, and Person)  Thought Content:  Logical  Suicidal Thoughts:  No  Homicidal Thoughts:  No  Memory:  Immediate;   Good Recent;   Good Remote;   Good  Judgement:  Poor  Insight:  Fair  Psychomotor Activity:  Normal  Concentration:  Concentration: Good and Attention Span: Good  Recall:  Good  Fund of Knowledge:  Good  Language:  Good  Akathisia:  No  Handed:  Right  AIMS (if indicated):   N/A  Assets:  Communication Skills Desire for Improvement Housing Social Support  ADL's:  Intact  Cognition:  WNL  Sleep:   Okay   Assessment:  MADDYX BEAMES is a 58 y.o. female who was admitted with altered mental status. She denies mood symptoms and appears euthymic in affect. She denies SI, HI or AVH. She is appropriate in behavior. She admits to problematic alcohol use. She reports plans to go to rehab and would like resources for outpatient psychiatrists and substance abuse treatment. She does not warrant inpatient psychiatric hospitalization at this time. Her presentation is largely attributed to alcohol use. She has good insight about her condition but exercises poor judgment. She will need to seek substance abuse treatment for herself and cannot be forced to do so. It may be helpful for her support system to encourage patient to seek treatment as well.   Treatment Plan Summary: -Continue home medications: Prozac 20 mg daily for mood and Gabapentin 300 mg TID for anxiety/alcohol use. -EKG reviewed and QTc 438 on 2/13. Please closely monitor when starting or increasing QTc prolonging agents.  -Please have SW provide patient with outpatient mental health resources for psychiatrist and substance abuse treatment.  -Psychiatry will sign off on patient at this time. Please consult psychiatry again as needed.    Disposition: No evidence of imminent risk to self or others at present.   Patient does not meet criteria for psychiatric inpatient  admission.  Cherly Beach, DO 03/24/2018 11:28 AM

## 2018-03-24 NOTE — Progress Notes (Signed)
[facility] Progress Note  Patient: Anne Black JJH:[id number]   PCP: System, Pcp Not In DOB: [birth year]   DOA: 03/23/2018   DOS: 03/24/2018   Date of Service: the patient was seen and examined on 03/24/2018  Brief hospital course: Pt. with PMH of chronic alcoholism, cirrhosis, bipolar disorder, COPD, ADHD, depression, paroxysmal atrial fibrillation; admitted on 03/23/2018, presented with complaint of unreponsive event, was found to have A. fib with RVR. Currently further plan is continue current treatment.  Subjective: Reports right-sided headache.  No nausea no vomiting.  No fever no chills.  Reports cough as well as some shortness of breath.  No fever at home.  Assessment and Plan: Paroxysmal atrial fibrillation with rapid ventricular response now sinus. Syncope. Blood pressure is stable.  This is likely been brought on by excessive alcohol use and likely withdrawal as well as dehydration in the setting of being found down for more than 24 hours. -Admit to stepdown unit, continuous telemetry -Continue diltiazem switch from infusion to p.o.   Patient has not been on any home oral rate control medications as her atrial fibrillation has been paroxysmal and usually in the setting of alcohol withdrawal.  Alcohol use disorder, in acute withdrawal here  -Continue CIWA protocol -Continue multivitamin, folate, thiamine -She has a current IVC signed by the magistrate.   Continue tele monitoring.  Psychiatry consulted and patient is cleared.  Cirrhosis due to alcoholic liver disease -Given her rapid A. fib and apparent mild dehydration, will hold her Lasix temporarily.  Will continue spironolactone given her hypokalemia. -Continue oral lactulose  Hypokalemia -Replaced.  Will monitor.  Continue Aldactone.  Bipolar disorder, depression -Continue home Prozac  COPD, no exacerbation currently -Continue home fluticasone/salmeterol, duo nebs, Ventolin as needed  GERD -Continue  PPI  Diet: cardai CKD diet DVT Prophylaxis: subcutaneous Heparin  Advance goals of care discussion: full code  Family Communication: no family was present at bedside, at the time of interview.   Disposition:  Discharge to [facility].  Consultants: psych Procedures: none  Scheduled Meds: . diltiazem  30 mg Oral Q6H  . enoxaparin (LOVENOX) injection  40 mg Subcutaneous Q24H  . FLUoxetine  20 mg Oral Daily  . folic acid  1 mg Oral Daily  . gabapentin  300 mg Oral TID  . lactulose  20 g Oral BID  . mometasone-formoterol  2 puff Inhalation BID  . multivitamin with minerals  1 tablet Oral Daily  . neomycin-polymyxin-hydrocortisone  3 drop Both EARS TID  . nicotine  21 mg Transdermal Daily  . pantoprazole  40 mg Oral Daily  . thiamine  100 mg Oral Daily   Continuous Infusions: PRN Meds: acetaminophen, albuterol, alum & mag hydroxide-simeth, bisacodyl, docusate sodium, guaiFENesin, hydrocortisone cream, hydrOXYzine, ipratropium-albuterol, LORazepam **OR** LORazepam, LORazepam, ondansetron, promethazine, SUMAtriptan Antibiotics: Anti-infectives (From admission, onward)   None       Objective: Physical Exam: Vitals:   03/24/18 1000 03/24/18 1130 03/24/18 1200 03/24/18 1401  BP: (!) 112/59 (!) 111/56 (!) 123/57 (!) 128/55  Pulse:  71    Resp: (!) 21 19 20    Temp:  98.2 F (36.8 C)    TempSrc:  Oral    SpO2:      Weight:      Height:        Intake/Output Summary (Last 24 hours) at 03/24/2018 1917 Last data filed at 03/24/2018 1733 Gross per 24 hour  Intake 1280 ml  Output 1300 ml  Net -20 ml   [employer]  03/24/18 0703  Weight: 84.8 kg   General: Alert, Awake and Oriented to Time, Place and Person. Appear in mild distress, affect blunted Eyes: PERRL, Conjunctiva normal ENT: Oral Mucosa clear moist. Neck: no JVD, no Abnormal Mass Or lumps Cardiovascular: S1 and S2 Present, no Murmur, Peripheral Pulses Present Respiratory: normal respiratory effort, Bilateral Air  entry equal and Decreased, no use of accessory muscle, Clear to Auscultation, no Crackles, no wheezes Abdomen: Bowel Sound present, Soft and no tenderness, no hernia Skin: no redness, no Rash, no induration Extremities: no Pedal edema, no calf tenderness Neurologic: Grossly no focal neuro deficit. Bilaterally Equal motor strength  Data Reviewed: CBC: Recent Labs  Lab 03/18/18 2338 03/19/18 1332 03/20/18 0549 03/23/18 1549 03/24/18 0321  WBC 5.3 3.8* 4.4 7.4 5.4  NEUTROABS  --   --  4.0 5.4  --   HGB 9.6* 8.5* 8.5* 10.5* 8.6*  HCT 32.9* 29.8* 30.7* 35.8* 29.7*  MCV 89.9 90.3 91.4 84.2 83.4  PLT 113* 93* 88* PLATELET CLUMPS NOTED ON SMEAR, UNABLE TO ESTIMATE 105*   Basic Metabolic Panel: Recent Labs  Lab 03/18/18 2338 03/19/18 0704 03/19/18 1332 03/20/18 0549 03/23/18 1549 03/24/18 0321  NA 141  --  140 138 141 139  K 3.0*  --  3.8 4.0 2.7* 3.7  CL 99  --  102 103 99 99  CO2 31  --  31 30 29 31   GLUCOSE 112*  --  119* 142* 114* 106*  BUN 10  --  9 10 <5* <5*  CREATININE 0.40*  --  0.38* 0.32* 0.44 0.51  CALCIUM 7.8*  --  7.9* 8.2* 7.8* 8.0*  MG  --  2.0  --   --  1.8  --     Liver Function Tests: Recent Labs  Lab 03/18/18 2338 03/20/18 0549 03/23/18 1549  AST 35 18 39  ALT 21 16 25   ALKPHOS 94 70 92  BILITOT 0.9 1.0 0.7  PROT 6.7 5.8* 5.6*  ALBUMIN 3.6 3.1* 2.9*   No results for input(s): LIPASE, AMYLASE in the last 168 hours. Recent Labs  Lab 03/19/18 0920 03/23/18 1549  AMMONIA 62* 28   Coagulation Profile: Recent Labs  Lab 03/23/18 1549  INR 1.25   Cardiac Enzymes: Recent Labs  Lab 03/19/18 0704 03/23/18 1549  CKTOTAL  --  147  TROPONINI <0.03 <0.03   BNP (last 3 results) No results for input(s): PROBNP in the last 8760 hours. CBG: Recent Labs  Lab 03/18/18 2341  GLUCAP 112*   Studies: No results found.   Time spent: 35 minutes  Author: Lynden Oxford, MD [facility] 03/24/2018 7:17 PM  To reach On-call, see care teams  to locate the attending and reach out to them via [email or web address]. If 7PM-7AM, please contact night-coverage If you still have difficulty reaching the attending provider, please page the [facility] (Director on Call) for [facility] on amion for assistance.

## 2018-03-25 ENCOUNTER — Other Ambulatory Visit: Payer: Self-pay

## 2018-03-25 LAB — BASIC METABOLIC PANEL
Anion gap: 7 (ref 5–15)
BUN: <5 mg/dL — ABNORMAL LOW (ref 6–20)
CO2: 31 mmol/L (ref 22–32)
Calcium: 8.2 mg/dL — ABNORMAL LOW (ref 8.9–10.3)
Chloride: 102 mmol/L (ref 98–111)
Creatinine, Ser: 0.4 mg/dL — ABNORMAL LOW (ref 0.44–1.00)
GFR calc Af Amer: >60 mL/min (ref >60)
GFR calc non Af Amer: >60 mL/min (ref >60)
Glucose, Bld: 100 mg/dL — ABNORMAL HIGH (ref 70–99)
POTASSIUM: 3.7 mmol/L (ref 3.5–5.1)
Sodium: 140 mmol/L (ref 135–145)

## 2018-03-25 LAB — CBC
HCT: 32.6 % — ABNORMAL LOW (ref 36.0–46.0)
Hemoglobin: 9.5 g/dL — ABNORMAL LOW (ref 12.0–15.0)
MCH: 24.9 pg — ABNORMAL LOW (ref 26.0–34.0)
MCHC: 29.1 g/dL — ABNORMAL LOW (ref 30.0–36.0)
MCV: 85.6 fL (ref 80.0–100.0)
PLATELETS: 128 10*3/uL — AB (ref 150–400)
RBC: 3.81 MIL/uL — AB (ref 3.87–5.11)
RDW: 16.3 % — ABNORMAL HIGH (ref 11.5–15.5)
WBC: 5.7 10*3/uL (ref 4.0–10.5)
nRBC: 0 % (ref 0.0–0.2)

## 2018-03-25 LAB — MAGNESIUM: Magnesium: 2 mg/dL (ref 1.7–2.4)

## 2018-03-25 MED ORDER — LORAZEPAM 2 MG/ML IJ SOLN: 0.0000 mg | Freq: Two times a day (BID) | INTRAMUSCULAR | Status: DC

## 2018-03-25 MED ORDER — DILTIAZEM HCL ER COATED BEADS 120 MG PO CP24
120.0000 mg | ORAL_CAPSULE | Freq: Every day | ORAL | Status: DC
  Administered 2018-03-25 – 2018-03-28 (×4): 120 mg via ORAL
  Filled 2018-03-25 (×4): qty 1

## 2018-03-25 MED ORDER — TRAZODONE HCL 50 MG PO TABS
50.0000 mg | ORAL_TABLET | Freq: Every day | ORAL | Status: AC
  Administered 2018-03-25: 50 mg via ORAL
  Filled 2018-03-25: qty 1

## 2018-03-25 MED ORDER — FOLIC ACID 1 MG PO TABS
1.0000 mg | ORAL_TABLET | Freq: Every day | ORAL | Status: DC
  Administered 2018-03-25 – 2018-03-28 (×4): 1 mg via ORAL
  Filled 2018-03-25 (×4): qty 1

## 2018-03-25 MED ORDER — SPIRONOLACTONE 25 MG PO TABS
25.0000 mg | ORAL_TABLET | Freq: Every day | ORAL | Status: DC
  Administered 2018-03-25 – 2018-03-28 (×4): 25 mg via ORAL
  Filled 2018-03-25 (×4): qty 1

## 2018-03-25 MED ORDER — LORAZEPAM 2 MG/ML IJ SOLN
0.0000 mg | Freq: Four times a day (QID) | INTRAMUSCULAR | Status: AC
  Administered 2018-03-25: 2 mg via INTRAVENOUS
  Administered 2018-03-26: 1 mg via INTRAVENOUS
  Administered 2018-03-27: 4 mg via INTRAVENOUS
  Filled 2018-03-25: qty 1
  Filled 2018-03-25: qty 2
  Filled 2018-03-25: qty 1

## 2018-03-25 MED ORDER — ADULT MULTIVITAMIN W/MINERALS CH
1.0000 | ORAL_TABLET | Freq: Every day | ORAL | Status: DC
  Administered 2018-03-25 – 2018-03-28 (×4): 1 via ORAL
  Filled 2018-03-25 (×4): qty 1

## 2018-03-25 MED ORDER — VITAMIN B-1 100 MG PO TABS
100.0000 mg | ORAL_TABLET | Freq: Every day | ORAL | Status: DC
  Administered 2018-03-25 – 2018-03-28 (×4): 100 mg via ORAL
  Filled 2018-03-25 (×4): qty 1

## 2018-03-25 MED ORDER — FUROSEMIDE 40 MG PO TABS
40.0000 mg | ORAL_TABLET | Freq: Every day | ORAL | Status: DC
  Administered 2018-03-25: 40 mg via ORAL
  Filled 2018-03-25: qty 1

## 2018-03-25 MED ORDER — LORAZEPAM 2 MG/ML IJ SOLN: 1.0000 mg | Freq: Four times a day (QID) | INTRAMUSCULAR | Status: DC | PRN

## 2018-03-25 MED ORDER — THIAMINE HCL 100 MG/ML IJ SOLN: 100.0000 mg | Freq: Every day | INTRAMUSCULAR | Status: DC

## 2018-03-25 MED ORDER — LORAZEPAM 1 MG PO TABS
1.0000 mg | ORAL_TABLET | Freq: Four times a day (QID) | ORAL | Status: DC | PRN
  Administered 2018-03-25 – 2018-03-27 (×3): 1 mg via ORAL
  Filled 2018-03-25 (×3): qty 1

## 2018-03-25 NOTE — Evaluation (Signed)
Physical Therapy Evaluation Patient Details Name: Anne Black MRN: [id number] DOB: [birth year] Today's Date: 03/25/2018   History of Present Illness  Pt adm unresponsive and found to have afib with rvr. Pt with ETOH abuse and alcohol withdrawal. PMH - chronic alcoholism, cirrhosis, bipolar disorder, COPD, ADHD, depression, paroxysmal atrial fibrillation, MS  Clinical Impression  Pt presents to PT with decr mobility. Pt has been seen repeatedly over the past three years and has repeated refused our recommendations concerning follow up. Again she has declined recommendation of SNF. Expect pt will likely continue to struggle at home due to her many issues.    Follow Up Recommendations SNF(Pt refuses so would recommend HHPT)    Equipment Recommendations  None recommended by PT    Recommendations for Other Services       Precautions / Restrictions Precautions Precautions: Fall      Mobility  Bed Mobility Overal bed mobility: Needs Assistance Bed Mobility: Supine to Sit     Supine to sit: Min assist;HOB elevated     General bed mobility comments: Assist to elevate trunk into sitting  Transfers Overall transfer level: Needs assistance Equipment used: Rolling walker (2 wheeled) Transfers: Sit to/from Stand Sit to Stand: Min guard         General transfer comment: Assist for safety  Ambulation/Gait Ambulation/Gait assistance: Min guard Gait Distance (Feet): 150 Feet(x 2) Assistive device: Rolling walker (2 wheeled) Gait Pattern/deviations: Step-through pattern;Decreased stride length Gait velocity: decr Gait velocity interpretation: 1.31 - 2.62 ft/sec, indicative of limited community ambulator General Gait Details: Assist for safety and to manage lines. Initially amb on RA with SpO2 down to 82%. Amb on 3L with SpO2 89%.  Stairs            Wheelchair Mobility    Modified Rankin (Stroke Patients Only)       Balance Overall balance assessment: Needs  assistance Sitting-balance support: No upper extremity supported;Feet supported Sitting balance-Leahy Scale: Good     Standing balance support: No upper extremity supported Standing balance-Leahy Scale: Fair                               Pertinent Vitals/Pain Pain Assessment: Faces Faces Pain Scale: No hurt    Home Living Family/patient expects to be discharged to:: Private residence Living Arrangements: Alone     Home Access: Level entry     Home Layout: Two level;Able to live on main level with bedroom/bathroom Home Equipment: Dan Humphreys - 2 wheels;Cane - single point;Wheelchair - manual;Shower seat      Prior Function Level of Independence: Independent with assistive device(s)         Comments: Uses walker. History of self neglect     Hand Dominance   Dominant Hand: Right    Extremity/Trunk Assessment   Upper Extremity Assessment Upper Extremity Assessment: Generalized weakness    Lower Extremity Assessment Lower Extremity Assessment: Generalized weakness       Communication   Communication: No difficulties  Cognition Arousal/Alertness: Awake/alert Behavior During Therapy: WFL for tasks assessed/performed Overall Cognitive Status: No family/caregiver present to determine baseline cognitive functioning                                 General Comments: Pt with unrealistic view of her medical problems. Denies to me that she has history of falls until recently  General Comments      Exercises     Assessment/Plan    PT Assessment Patient needs continued PT services  PT Problem List Decreased strength;Decreased activity tolerance;Decreased balance;Decreased mobility;Decreased knowledge of use of DME;Decreased safety awareness       PT Treatment Interventions DME instruction;Gait training;Functional mobility training;Therapeutic activities;Therapeutic exercise;Balance training;Patient/family education    PT Goals  (Current goals can be found in the Care Plan section)  Acute Rehab PT Goals Patient Stated Goal: go home PT Goal Formulation: With patient Time For Goal Achievement: 04/01/18 Potential to Achieve Goals: Good    Frequency Min 3X/week   Barriers to discharge Decreased caregiver support Lives alone    Co-evaluation               AM-PAC PT "6 Clicks" Mobility  Outcome Measure Help needed turning from your back to your side while in a flat bed without using bedrails?: None Help needed moving from lying on your back to sitting on the side of a flat bed without using bedrails?: A Little Help needed moving to and from a bed to a chair (including a wheelchair)?: A Little Help needed standing up from a chair using your arms (e.g., wheelchair or bedside chair)?: A Little Help needed to walk in hospital room?: A Little Help needed climbing 3-5 steps with a railing? : A Little 6 Click Score: 19    End of Session Equipment Utilized During Treatment: Gait belt;Oxygen Activity Tolerance: Patient limited by fatigue Patient left: in chair;with nursing/sitter in room(sitter present) Nurse Communication: Mobility status;Other (comment)(O2 sats) PT Visit Diagnosis: Unsteadiness on feet (R26.81);History of falling (Z91.81);Muscle weakness (generalized) (M62.81)    Time: 8563-1497 PT Time Calculation (min) (ACUTE ONLY): 29 min   Charges:   PT Evaluation $PT Eval Moderate Complexity: 1 Mod PT Treatments $Gait Training: 8-22 mins        [facility] PT Acute Rehabilitation Services Pager [phone] Office [phone]   Angelina Ok [facility] 03/25/2018, 3:29 PM

## 2018-03-25 NOTE — Progress Notes (Signed)
Patients daughter Anayelli Shurtz called, would like update on mother's care, she does not want her d/ced ph. [phone].  Advised will give Dr. Allena Katz her information.

## 2018-03-25 NOTE — Progress Notes (Signed)
[facility] Progress Note  Patient: Anne Black [id number]:[id number]   PCP: System, Pcp Not In DOB: 1960-11-18   DOA: 03/23/2018   DOS: 03/25/2018   Date of Service: the patient was seen and examined on 03/25/2018  Brief hospital course: Pt. with PMH of chronic alcoholism, cirrhosis, bipolar disorder, COPD, ADHD, depression, paroxysmal atrial fibrillation; admitted on 03/23/2018, presented with complaint of unreponsive event, was found to have A. fib with RVR. Currently further plan is continue current treatment.  Subjective: Headache is better.  Still has shortness of breath and cough and fever and chills.  Assessment and Plan: Paroxysmal atrial fibrillation with rapid ventricular response now sinus. Syncope. Blood pressure is stable.  This is likely been brought on by excessive alcohol use and likely withdrawal as well as dehydration in the setting of being found down for more than 24 hours. -Admit to stepdown unit, continuous telemetry -Continue diltiazem switch from infusion to p.o.   Patient has not been on any home oral rate control medications as her atrial fibrillation has been paroxysmal and usually in the setting of alcohol withdrawal.  Alcohol use disorder, in acute withdrawal here  -Continue CIWA protocol -Continue multivitamin, folate, thiamine -She has a current IVC signed by the magistrate.   Continue tele monitoring.  Psychiatry consulted and patient is cleared.  Cirrhosis due to alcoholic liver disease -Given her rapid A. fib and apparent mild dehydration, will hold her Lasix temporarily.  Will continue spironolactone given her hypokalemia. -Continue oral lactulose  Hypokalemia -Replaced.  Will monitor.  Continue Aldactone.  Bipolar disorder, depression -Continue home Prozac  COPD, no exacerbation currently -Continue home fluticasone/salmeterol, duo nebs, Ventolin as needed  GERD -Continue PPI  Social issues. Patient's primary issue is alcoholism.   Patient has frequent ER visits secondary to complications from chronic alcoholism.  Family is very concerned about patient's outcome as the patient's behavior at home concerns them with continues use of alcohol despite significant risk to her health.  Psychiatry evaluated the patient and patient does have capacity to make medical decision.  Discussed with social worker given that the patient does have the capacity and is not at imminent risk for harming herself unable to provide any further assistance at present other than resources for alcohol abuse on discharge. Inform family that the patient will be discharged once medically stable. Family wanted to talk with psychiatry although the psychiatrist who has seen the patient is not available over the weekend.  If the patient stays on Monday will relay this information to the psychiatrist to discuss with the family.  Diet: cardiac diet DVT Prophylaxis: subcutaneous Heparin  Advance goals of care discussion: full code  Family Communication: no family was present at bedside, at the time of interview.   Disposition:  Discharge to hopme.  Consultants: psych Procedures: none  Scheduled Meds: . diltiazem  120 mg Oral Daily  . enoxaparin (LOVENOX) injection  40 mg Subcutaneous Q24H  . FLUoxetine  20 mg Oral Daily  . folic acid  1 mg Oral Daily  . furosemide  40 mg Oral Daily  . gabapentin  300 mg Oral TID  . lactulose  20 g Oral BID  . LORazepam  0-4 mg Intravenous Q6H   Followed by  . [START ON 03/27/2018] LORazepam  0-4 mg Intravenous Q12H  . mometasone-formoterol  2 puff Inhalation BID  . multivitamin with minerals  1 tablet Oral Daily  . neomycin-polymyxin-hydrocortisone  3 drop Both EARS TID  . nicotine  21 mg Transdermal  Daily  . pantoprazole  40 mg Oral Daily  . spironolactone  25 mg Oral Daily  . thiamine  100 mg Oral Daily   Or  . thiamine  100 mg Intravenous Daily   Continuous Infusions: PRN Meds: acetaminophen, albuterol, alum  & mag hydroxide-simeth, bisacodyl, docusate sodium, guaiFENesin, hydrocortisone cream, hydrOXYzine, ipratropium-albuterol, LORazepam **OR** LORazepam, ondansetron, promethazine Antibiotics: Anti-infectives (From admission, onward)   None       Objective: Physical Exam: Vitals:   03/25/18 1153 03/25/18 1422 03/25/18 1424 03/25/18 1800  BP:  106/63  (!) 119/50  Pulse: 94  83 87  Resp:  (!) 21  (!) 22  Temp:   98.4 F (36.9 C)   TempSrc:   Oral   SpO2:   100%   Weight:      Height:        Intake/Output Summary (Last 24 hours) at 03/25/2018 1919 Last data filed at 03/25/2018 1814 Gross per 24 hour  Intake 1370 ml  Output 3275 ml  Net -1905 ml   Filed Weights   03/24/18 0703  Weight: 84.8 kg   General: Alert, Awake and Oriented to Time, Place and Person. Appear in mild distress, affect blunted Eyes: PERRL, Conjunctiva normal ENT: Oral Mucosa clear moist. Neck: no JVD, no Abnormal Mass Or lumps Cardiovascular: S1 and S2 Present, no Murmur, Peripheral Pulses Present Respiratory: normal respiratory effort, Bilateral Air entry equal and Decreased, no use of accessory muscle, Clear to Auscultation, no Crackles, no wheezes Abdomen: Bowel Sound present, Soft and no tenderness, no hernia Skin: no redness, no Rash, no induration Extremities: no Pedal edema, no calf tenderness Neurologic: Grossly no focal neuro deficit. Bilaterally Equal motor strength  Data Reviewed: CBC: Recent Labs  Lab 03/19/18 1332 03/20/18 0549 03/23/18 1549 03/24/18 0321 03/25/18 0447  WBC 3.8* 4.4 7.4 5.4 5.7  NEUTROABS  --  4.0 5.4  --   --   HGB 8.5* 8.5* 10.5* 8.6* 9.5*  HCT 29.8* 30.7* 35.8* 29.7* 32.6*  MCV 90.3 91.4 84.2 83.4 85.6  PLT 93* 88* PLATELET CLUMPS NOTED ON SMEAR, UNABLE TO ESTIMATE 105* 128*   Basic Metabolic Panel: Recent Labs  Lab 03/19/18 0704 03/19/18 1332 03/20/18 0549 03/23/18 1549 03/24/18 0321 03/25/18 0447  NA  --  140 138 141 139 140  K  --  3.8 4.0 2.7* 3.7 3.7   CL  --  102 103 99 99 102  CO2  --  31 30 29 31 31   GLUCOSE  --  119* 142* 114* 106* 100*  BUN  --  9 10 <5* <5* <5*  CREATININE  --  0.38* 0.32* 0.44 0.51 0.40*  CALCIUM  --  7.9* 8.2* 7.8* 8.0* 8.2*  MG 2.0  --   --  1.8  --  2.0    Liver Function Tests: Recent Labs  Lab 03/18/18 2338 03/20/18 0549 03/23/18 1549  AST 35 18 39  ALT 21 16 25   ALKPHOS 94 70 92  BILITOT 0.9 1.0 0.7  PROT 6.7 5.8* 5.6*  ALBUMIN 3.6 3.1* 2.9*   No results for input(s): LIPASE, AMYLASE in the last 168 hours. Recent Labs  Lab 03/19/18 0920 03/23/18 1549  AMMONIA 62* 28   Coagulation Profile: Recent Labs  Lab 03/23/18 1549  INR 1.25   Cardiac Enzymes: Recent Labs  Lab 03/19/18 0704 03/23/18 1549  CKTOTAL  --  147  TROPONINI <0.03 <0.03   BNP (last 3 results) No results for input(s): PROBNP in the last 8760  hours. CBG: Recent Labs  Lab 03/18/18 2341  GLUCAP 112*   Studies: No results found.   Time spent: 35 minutes  Author: Lynden Oxford, MD [facility] 03/25/2018 7:19 PM  To reach On-call, see care teams to locate the attending and reach out to them via [email or web address]. If 7PM-7AM, please contact night-coverage If you still have difficulty reaching the attending provider, please page the [facility] (Director on Call) for [facility] on amion for assistance.

## 2018-03-25 NOTE — Progress Notes (Signed)
SATURATION QUALIFICATIONS: (This note is used to comply with regulatory documentation for home oxygen)  Patient Saturations on Room Air at Rest = 89%  Patient Saturations on Room Air while Ambulating = 82%  Patient Saturations on 3 Liters of oxygen while Ambulating = 89%  Please briefly explain why patient needs home oxygen:Pt unable to maintain adequate oxygenation levels with activity without supplemental O2.  Georga Hacking [facility] PT Acute Rehabilitation Services Pager [phone] Office [phone]

## 2018-03-26 LAB — BASIC METABOLIC PANEL
Anion gap: 5 (ref 5–15)
BUN: 6 mg/dL (ref 6–20)
CO2: 32 mmol/L (ref 22–32)
CREATININE: 0.43 mg/dL — AB (ref 0.44–1.00)
Calcium: 8 mg/dL — ABNORMAL LOW (ref 8.9–10.3)
Chloride: 102 mmol/L (ref 98–111)
GFR calc non Af Amer: >60 mL/min (ref >60)
Glucose, Bld: 120 mg/dL — ABNORMAL HIGH (ref 70–99)
Potassium: 3.6 mmol/L (ref 3.5–5.1)
Sodium: 139 mmol/L (ref 135–145)

## 2018-03-26 LAB — CBC
HCT: 30.9 % — ABNORMAL LOW (ref 36.0–46.0)
Hemoglobin: 8.9 g/dL — ABNORMAL LOW (ref 12.0–15.0)
MCH: 24.3 pg — ABNORMAL LOW (ref 26.0–34.0)
MCHC: 28.8 g/dL — AB (ref 30.0–36.0)
MCV: 84.4 fL (ref 80.0–100.0)
Platelets: 164 10*3/uL (ref 150–400)
RBC: 3.66 MIL/uL — ABNORMAL LOW (ref 3.87–5.11)
RDW: 16.7 % — ABNORMAL HIGH (ref 11.5–15.5)
WBC: 5.7 10*3/uL (ref 4.0–10.5)
nRBC: 0 % (ref 0.0–0.2)

## 2018-03-26 MED ORDER — POTASSIUM CHLORIDE CRYS ER 20 MEQ PO TBCR
40.0000 meq | EXTENDED_RELEASE_TABLET | Freq: Once | ORAL | Status: AC
  Administered 2018-03-26: 40 meq via ORAL
  Filled 2018-03-26: qty 2

## 2018-03-26 MED ORDER — POLYETHYLENE GLYCOL 3350 17 G PO PACK
17.0000 g | PACK | Freq: Every day | ORAL | Status: DC
  Administered 2018-03-26 – 2018-03-27 (×2): 17 g via ORAL
  Filled 2018-03-26 (×3): qty 1

## 2018-03-26 MED ORDER — FUROSEMIDE 10 MG/ML IJ SOLN
40.0000 mg | Freq: Two times a day (BID) | INTRAMUSCULAR | Status: DC
  Administered 2018-03-26 – 2018-03-28 (×4): 40 mg via INTRAVENOUS
  Filled 2018-03-26 (×4): qty 4

## 2018-03-26 MED ORDER — FUROSEMIDE 10 MG/ML IJ SOLN: 40.0000 mg | Freq: Two times a day (BID) | INTRAMUSCULAR | Status: DC

## 2018-03-26 MED ORDER — FUROSEMIDE 10 MG/ML IJ SOLN
40.0000 mg | Freq: Every day | INTRAMUSCULAR | Status: DC
  Administered 2018-03-26: 40 mg via INTRAVENOUS
  Filled 2018-03-26: qty 4

## 2018-03-26 NOTE — Progress Notes (Signed)
[facility] Progress Note  Patient: Anne Black SVX:[id number]   PCP: System, Pcp Not In DOB: 12-12-1960   DOA: 03/23/2018   DOS: 03/26/2018   Date of Service: the patient was seen and examined on 03/26/2018  Brief hospital course: Pt. with PMH of chronic alcoholism, cirrhosis, bipolar disorder, COPD, ADHD, depression, paroxysmal atrial fibrillation; admitted on 03/23/2018, presented with complaint of unreponsive event, was found to have A. fib with RVR. Currently further plan is continue current treatment.  Subjective: Continues to have shortness of breath nausea no vomiting fever and chills.  Wants to leave the hospital.  Case with that she wants to go home because she has a 4000 square foot house.  Assessment and Plan: Paroxysmal atrial fibrillation with rapid ventricular response now sinus. Syncope. Blood pressure is stable.  This is likely been brought on by excessive alcohol use and likely withdrawal as well as dehydration in the setting of being found down for more than 24 hours. -Continue diltiazem switch from infusion to p.o.   Patient has not been on any home oral rate control medications as her atrial fibrillation has been paroxysmal and usually in the setting of alcohol withdrawal.  Alcohol use disorder, in acute withdrawal here  -Continue CIWA protocol -Continue multivitamin, folate, thiamine -She was IVC signed by the magistrate.  Continue tele monitoring.  -Psychiatry consulted and patient is cleared. -Patient is at risk for leaving hospital AMA, counseled twice for not leaving AMA.  Cirrhosis due to alcoholic liver disease Acute on chronic diastolic CHF -Will continue spironolactone given her hypokalemia. -Patient was started on oral Lasix without any significant diuresis. -We will transition to IV Lasix.  Monitor. -Continue oral lactulose  Hypokalemia -Replaced.  Will monitor.  Continue Aldactone.  Bipolar disorder, depression -Continue home  Prozac  COPD, no exacerbation currently -Continue home fluticasone/salmeterol, duo nebs, Ventolin as needed  GERD -Continue PPI  Social issues. Patient's primary issue is alcoholism.  Patient has frequent ER visits secondary to complications from chronic alcoholism.  Family is very concerned about patient's outcome as the patient's behavior at home concerns them with continues use of alcohol despite significant risk to her health.  Psychiatry evaluated the patient and patient does have capacity to make medical decision.  Discussed with social worker given that the patient does have the capacity and is not at imminent risk for harming herself unable to provide any further assistance at present other than resources for alcohol abuse on discharge. Inform family that the patient will be discharged once medically stable. Family wanted to talk with psychiatry although the psychiatrist who has seen the patient is not available over the weekend. will relay this information to the psychiatrist to discuss with the family.  Diet: cardiac diet DVT Prophylaxis: subcutaneous Heparin  Advance goals of care discussion: full code  Family Communication: no family was present at bedside, at the time of interview.   Disposition:  Discharge to hopme.  Consultants: psych Procedures: none  Scheduled Meds: . diltiazem  120 mg Oral Daily  . enoxaparin (LOVENOX) injection  40 mg Subcutaneous Q24H  . FLUoxetine  20 mg Oral Daily  . folic acid  1 mg Oral Daily  . furosemide  40 mg Intravenous BID  . gabapentin  300 mg Oral TID  . lactulose  20 g Oral BID  . LORazepam  0-4 mg Intravenous Q6H   Followed by  . [START ON 03/27/2018] LORazepam  0-4 mg Intravenous Q12H  . mometasone-formoterol  2 puff Inhalation BID  .  multivitamin with minerals  1 tablet Oral Daily  . neomycin-polymyxin-hydrocortisone  3 drop Both EARS TID  . nicotine  21 mg Transdermal Daily  . pantoprazole  40 mg Oral Daily  .  spironolactone  25 mg Oral Daily  . thiamine  100 mg Oral Daily   Or  . thiamine  100 mg Intravenous Daily   Continuous Infusions: PRN Meds: acetaminophen, albuterol, alum & mag hydroxide-simeth, bisacodyl, docusate sodium, guaiFENesin, hydrocortisone cream, hydrOXYzine, ipratropium-albuterol, LORazepam **OR** LORazepam, ondansetron, promethazine Antibiotics: Anti-infectives (From admission, onward)   None       Objective: Physical Exam: Vitals:   03/25/18 2313 03/26/18 0651 03/26/18 0829 03/26/18 1311  BP: (!) 110/52 103/67 106/69 (!) 104/57  Pulse: 84 68  78  Resp: 20   15  Temp: 97.6 F (36.4 C) (!) 97.5 F (36.4 C)  98.1 F (36.7 C)  TempSrc: Oral Oral  Oral  SpO2: 99% 97% 98% 95%  Weight:  83.7 kg    Height:        Intake/Output Summary (Last 24 hours) at 03/26/2018 1648 Last data filed at 03/26/2018 1019 Gross per 24 hour  Intake 1080 ml  Output 2750 ml  Net -1670 ml   Filed Weights   03/24/18 0703 03/26/18 0651  Weight: 84.8 kg 83.7 kg   General: Alert, Awake and Oriented to Time, Place and Person. Appear in mild distress, affect blunted Eyes: PERRL, Conjunctiva normal ENT: Oral Mucosa clear moist. Neck: no JVD, no Abnormal Mass Or lumps Cardiovascular: S1 and S2 Present, no Murmur, Peripheral Pulses Present Respiratory: normal respiratory effort, Bilateral Air entry equal and Decreased, no use of accessory muscle, Clear to Auscultation, no Crackles, no wheezes Abdomen: Bowel Sound present, Soft and no tenderness, no hernia Skin: no redness, no Rash, no induration Extremities: no Pedal edema, no calf tenderness Neurologic: Grossly no focal neuro deficit. Bilaterally Equal motor strength  Data Reviewed: CBC: Recent Labs  Lab 03/20/18 0549 03/23/18 1549 03/24/18 0321 03/25/18 0447 03/26/18 0524  WBC 4.4 7.4 5.4 5.7 5.7  NEUTROABS 4.0 5.4  --   --   --   HGB 8.5* 10.5* 8.6* 9.5* 8.9*  HCT 30.7* 35.8* 29.7* 32.6* 30.9*  MCV 91.4 84.2 83.4 85.6 84.4   PLT 88* PLATELET CLUMPS NOTED ON SMEAR, UNABLE TO ESTIMATE 105* 128* 164   Basic Metabolic Panel: Recent Labs  Lab 03/20/18 0549 03/23/18 1549 03/24/18 0321 03/25/18 0447 03/26/18 0524  NA 138 141 139 140 139  K 4.0 2.7* 3.7 3.7 3.6  CL 103 99 99 102 102  CO2 30 29 31 31  32  GLUCOSE 142* 114* 106* 100* 120*  BUN 10 <5* <5* <5* 6  CREATININE 0.32* 0.44 0.51 0.40* 0.43*  CALCIUM 8.2* 7.8* 8.0* 8.2* 8.0*  MG  --  1.8  --  2.0  --     Liver Function Tests: Recent Labs  Lab 03/20/18 0549 03/23/18 1549  AST 18 39  ALT 16 25  ALKPHOS 70 92  BILITOT 1.0 0.7  PROT 5.8* 5.6*  ALBUMIN 3.1* 2.9*   No results for input(s): LIPASE, AMYLASE in the last 168 hours. Recent Labs  Lab 03/23/18 1549  AMMONIA 28   Coagulation Profile: Recent Labs  Lab 03/23/18 1549  INR 1.25   Cardiac Enzymes: Recent Labs  Lab 03/23/18 1549  CKTOTAL 147  TROPONINI <0.03   BNP (last 3 results) No results for input(s): PROBNP in the last 8760 hours. CBG: No results for input(s): GLUCAP in the  last 168 hours. Studies: No results found.   Time spent: 35 minutes  Author: Lynden Oxford, MD [facility] 03/26/2018 4:48 PM  To reach On-call, see care teams to locate the attending and reach out to them via [email or web address]. If 7PM-7AM, please contact night-coverage If you still have difficulty reaching the attending provider, please page the [facility] (Director on Call) for [facility] on amion for assistance.

## 2018-03-26 NOTE — Clinical Social Work Note (Signed)
Clinical Social Worker completed Commitment Change form and placed on patient chart now that patient has been cleared by Psychiatry.  Per MD, patient may be on board with possible SNF placement at discharge.  CSW to follow up with patient regarding potential placement options at discharge.  Macario Golds, LCSW (Weekend Coverage) [phone]

## 2018-03-26 NOTE — Clinical Social Work Note (Signed)
Clinical Social Work Assessment  Patient Details  Name: Anne Black MRN: [id number] Date of Birth: 08-15-1960  Date of referral:  03/26/18               Reason for consult:  Substance Use/ETOH Abuse, Family Concerns, Discharge Planning                Permission sought to share information with:  Family Supports Permission granted to share information::  Yes, Verbal Permission Granted  Name::        Agency::     Relationship::     Contact Information:     Housing/Transportation Living arrangements for the past 2 months:  Single Family Home Source of Information:  Patient Patient Interpreter Needed:  None Criminal Activity/Legal Involvement Pertinent to Current Situation/Hospitalization:  No - Comment as needed Significant Relationships:  Adult Children Lives with:  Self Do you feel safe going back to the place where you live?  Yes Need for family participation in patient care:  No (Coment)  Care giving concerns:  No family at bedside. Patein stated she lives alone but has support from adult children. Patient stated she is upset at her daughter for IVC her during this admission. Patient stated she understands that she needs to go to a substance rehab facility   Social Worker assessment / plan:  CSW met patient at bedside to offer support and discharge needs. Patient was very anxious and stated she is eager to go today. Patient stated she is agreeable to go to a SNF for rehab or a substance abuse rehab facility as long as she can go today. CSW explained the process of placing patient into a SNF will take some time due to her insurance. CSW will follow up with patient when bed offers are available. Patient requested 12 step books to read while in the hospital. CSW stated that she does not have that resources available to her. CSW gave patient crossword puzzles and some news paper to read. CSW sill follow up with patient once SNF is available   Employment status:  Disabled (Comment on  whether or not currently receiving Disability) Insurance information:  Other (Comment Required)([employer]) PT Recommendations:  [place] / Referral to community resources:  [place]  Patient/Family's Response to care:  Patient very anxious and wanting to go to rehab as soon as possible  Patient/Family's Understanding of and Emotional Response to Diagnosis, Current Treatment, and Prognosis:  Patient wanting to go to SNF or substance abuse rehab   Emotional Assessment Appearance:  Appears stated age Attitude/Demeanor/Rapport:  Engaged Affect (typically observed):  Anxious, Restless Orientation:  Oriented to Self, Oriented to Place, Oriented to Situation, Oriented to  Time Alcohol / Substance use:  Alcohol Use Psych involvement (Current and /or in the community):  No (Comment)  Discharge Needs  Concerns to be addressed:  Substance Abuse Concerns, Care Coordination Readmission within the last 30 days:  Yes Current discharge risk:  Dependent with Mobility, Substance Abuse Barriers to Discharge:  [place], LCSW 03/26/2018, 2:43 PM

## 2018-03-27 LAB — BASIC METABOLIC PANEL
Anion gap: 8 (ref 5–15)
BUN: 7 mg/dL (ref 6–20)
CO2: 30 mmol/L (ref 22–32)
Calcium: 8.3 mg/dL — ABNORMAL LOW (ref 8.9–10.3)
Chloride: 101 mmol/L (ref 98–111)
Creatinine, Ser: 0.43 mg/dL — ABNORMAL LOW (ref 0.44–1.00)
GFR calc Af Amer: >60 mL/min (ref >60)
GFR calc non Af Amer: >60 mL/min (ref >60)
GLUCOSE: 111 mg/dL — AB (ref 70–99)
Potassium: 4.1 mmol/L (ref 3.5–5.1)
Sodium: 139 mmol/L (ref 135–145)

## 2018-03-27 LAB — CBC
HCT: 31.9 % — ABNORMAL LOW (ref 36.0–46.0)
Hemoglobin: 9 g/dL — ABNORMAL LOW (ref 12.0–15.0)
MCH: 23.7 pg — AB (ref 26.0–34.0)
MCHC: 28.2 g/dL — ABNORMAL LOW (ref 30.0–36.0)
MCV: 83.9 fL (ref 80.0–100.0)
Platelets: 176 10*3/uL (ref 150–400)
RBC: 3.8 MIL/uL — ABNORMAL LOW (ref 3.87–5.11)
RDW: 16.9 % — ABNORMAL HIGH (ref 11.5–15.5)
WBC: 5.8 10*3/uL (ref 4.0–10.5)
nRBC: 0 % (ref 0.0–0.2)

## 2018-03-27 IMAGING — CR DG SHOULDER 2+V*R*
2 series · 2 of 2 positions shown · non-contrast
Comparison: None.

CLINICAL DATA: Pt c/o diffuse right shoulder pain s/p alleged fall
this week, pt not able, or willing, to communicate more hx. Pt
states she hurts all over. [REDACTED] chart states pt hx MS, symptoms
worsening this week. Pt also c/o n/v

EXAM:
RIGHT SHOULDER - 2+ VIEW

[x shoulder ap right (1 of 2)]
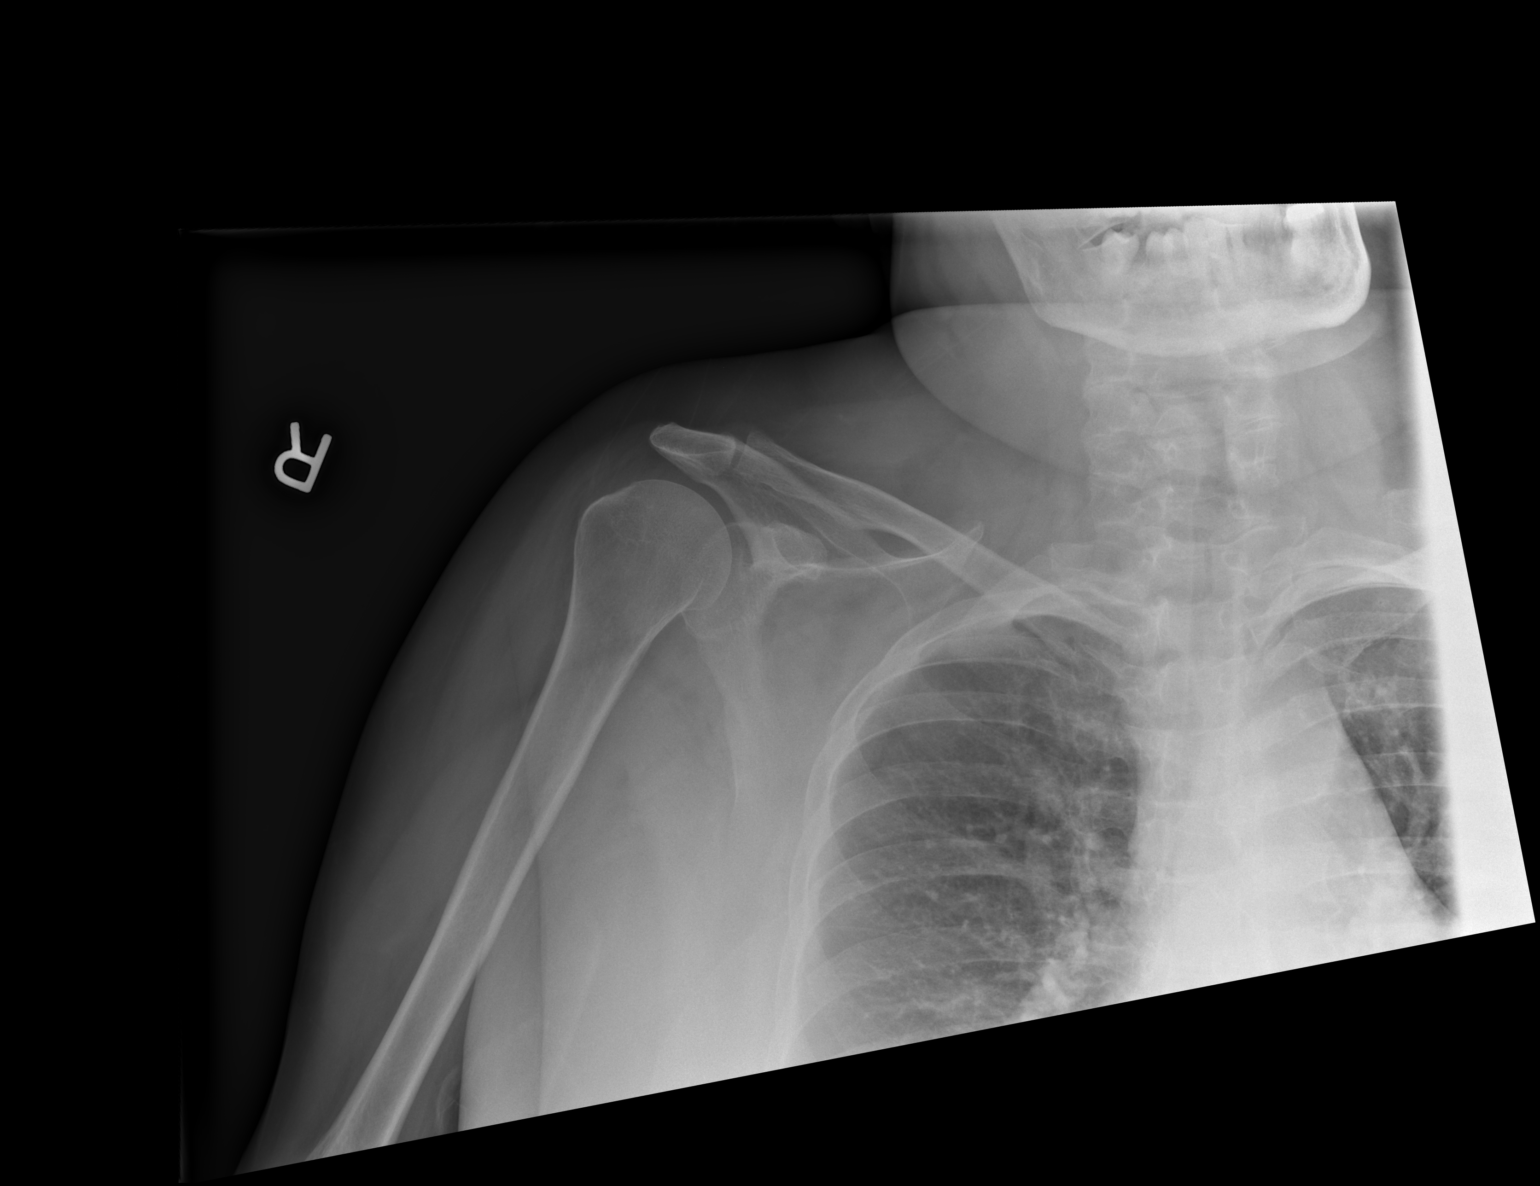

[x shoulder ap right (2 of 2)]
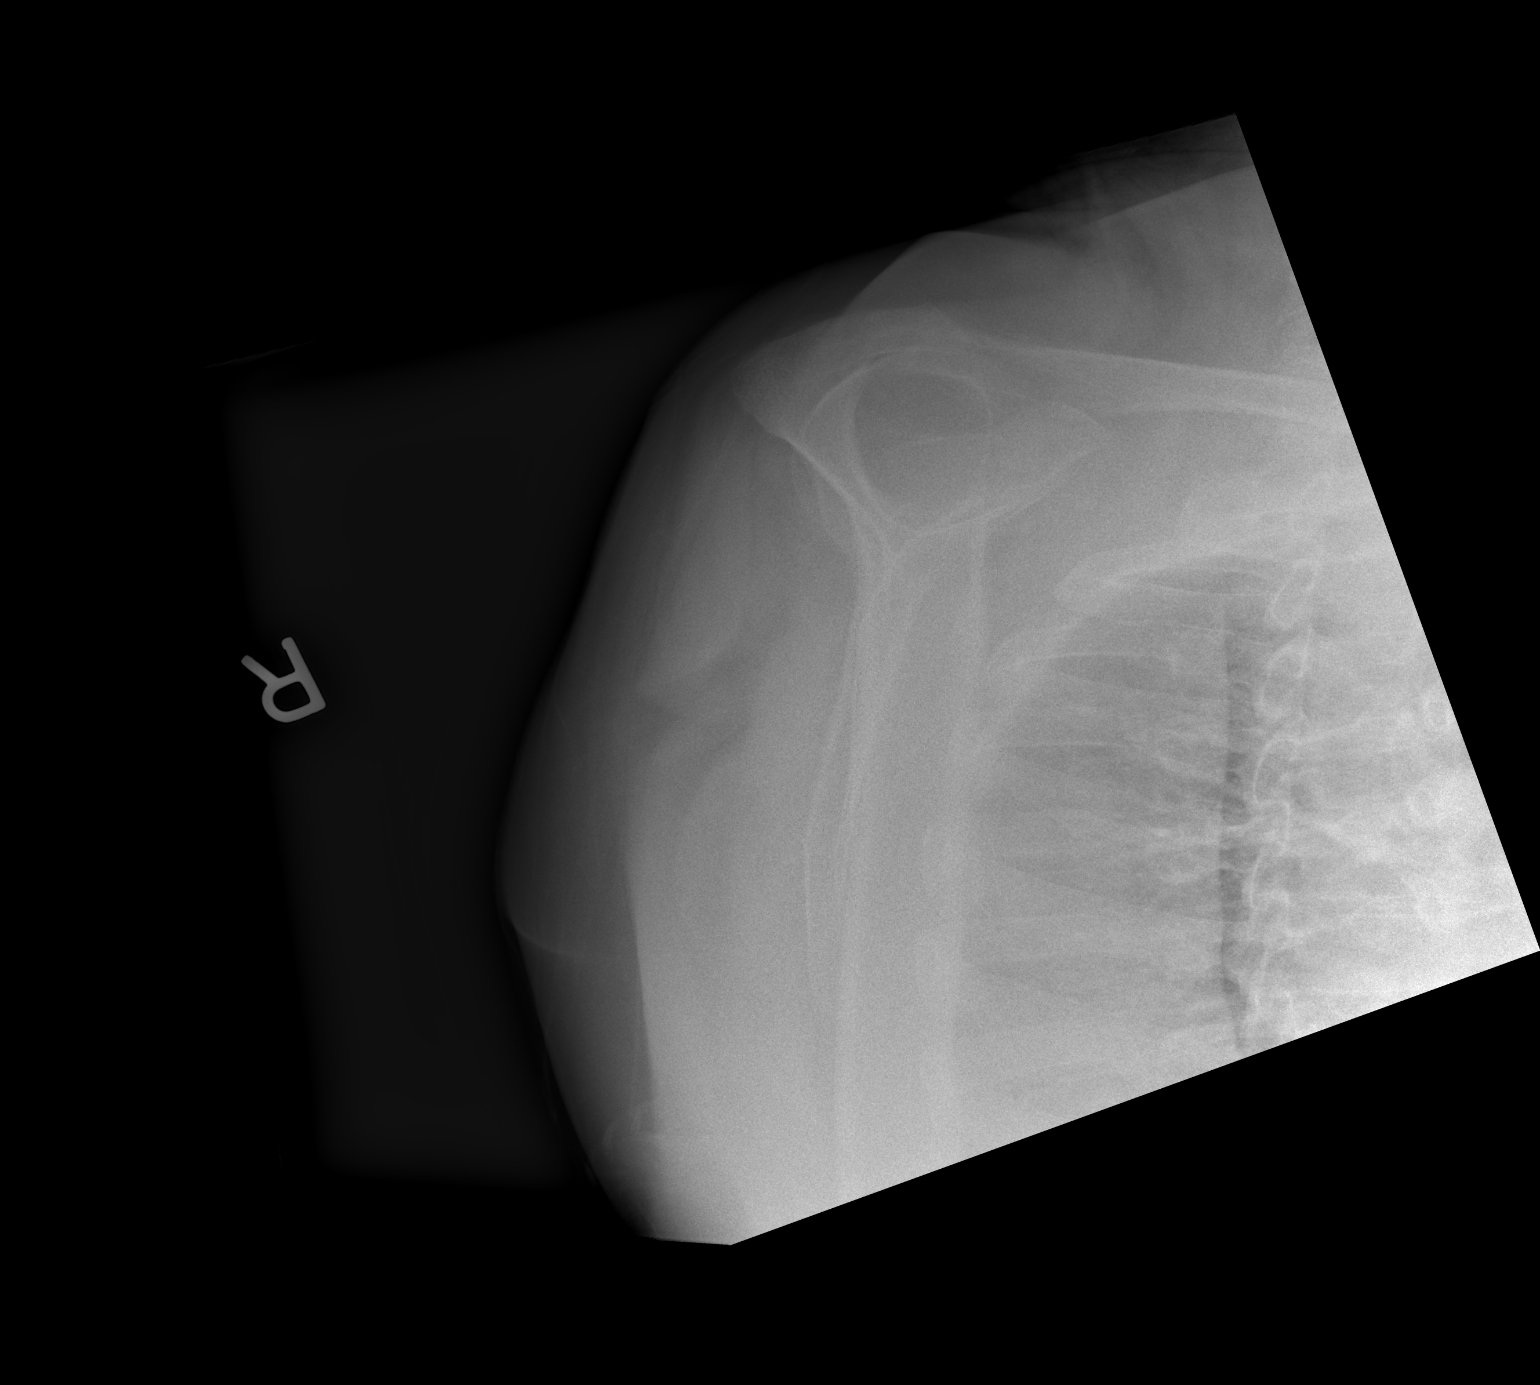

[2 of 2 positions shown; findings below may reference images not displayed]

FINDINGS: No fracture. No dislocation. No significant arthropathic changes. No
bone lesion. Soft tissues are unremarkable.
IMPRESSION: No fracture or dislocation.

## 2018-03-27 IMAGING — DX DG CHEST 1V PORT
1 series · 1 of 1 positions shown · non-contrast
Comparison: Chest radiograph 04/06/2015.

CLINICAL DATA: Patient with sepsis.  Cough.

EXAM:
PORTABLE CHEST 1 VIEW

[chest ap]
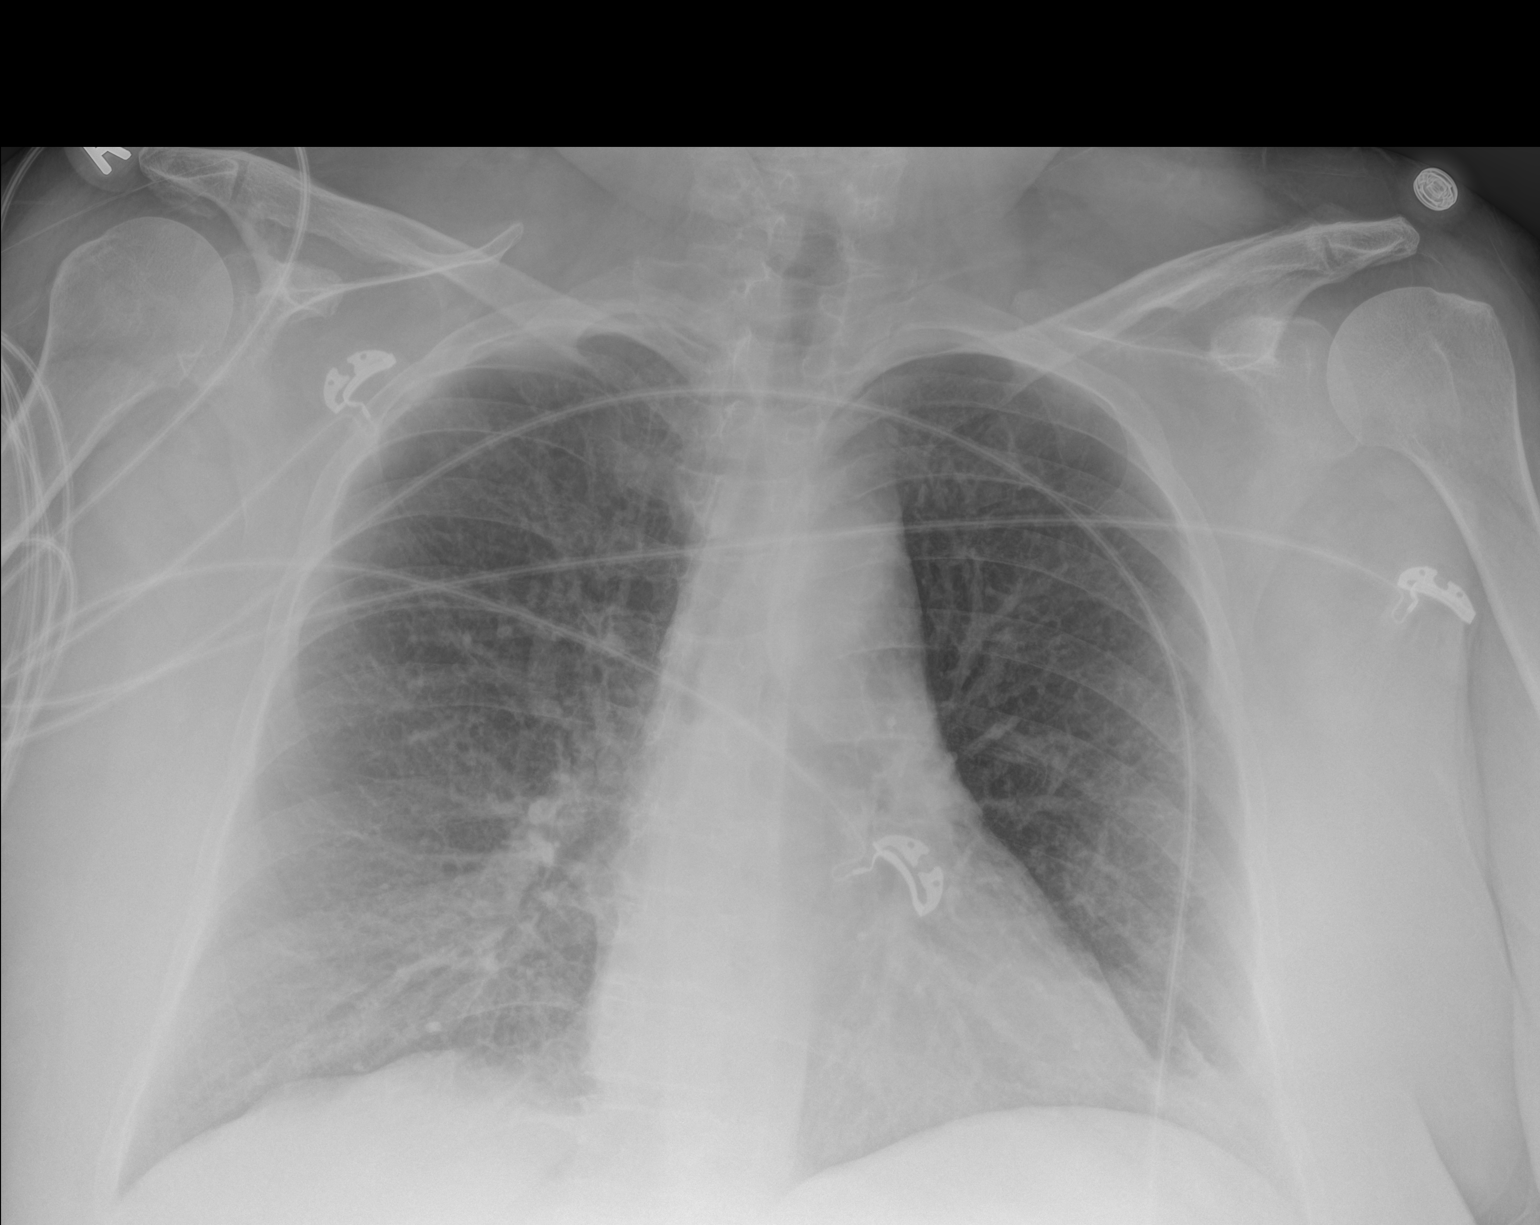

[1 of 1 positions shown; findings below may reference images not displayed]

FINDINGS: Multiple monitoring leads overlie the patient. Stable cardiac and
mediastinal contours. No consolidative pulmonary opacity. No pleural
effusion or pneumothorax.
IMPRESSION: No acute cardiopulmonary process.

## 2018-03-27 MED ORDER — FLEET ENEMA 7-19 GM/118ML RE ENEM
1.0000 | ENEMA | Freq: Every day | RECTAL | Status: DC | PRN
  Administered 2018-03-27: 1 via RECTAL
  Filled 2018-03-27: qty 1

## 2018-03-27 MED ORDER — DOCUSATE SODIUM 100 MG PO CAPS
100.0000 mg | ORAL_CAPSULE | Freq: Two times a day (BID) | ORAL | Status: DC
  Administered 2018-03-28: 100 mg via ORAL
  Filled 2018-03-27 (×2): qty 1

## 2018-03-27 MED ORDER — TRAMADOL HCL 50 MG PO TABS
50.0000 mg | ORAL_TABLET | Freq: Four times a day (QID) | ORAL | Status: DC | PRN
  Administered 2018-03-27: 50 mg via ORAL
  Filled 2018-03-27: qty 1

## 2018-03-27 MED ORDER — LACTULOSE 10 GM/15ML PO SOLN
30.0000 g | Freq: Three times a day (TID) | ORAL | Status: DC
  Administered 2018-03-27: 30 g via ORAL
  Filled 2018-03-27: qty 45

## 2018-03-27 MED ORDER — BISACODYL 5 MG PO TBEC
5.0000 mg | DELAYED_RELEASE_TABLET | Freq: Every day | ORAL | Status: DC | PRN
  Administered 2018-03-27: 5 mg via ORAL
  Filled 2018-03-27: qty 1

## 2018-03-27 NOTE — Social Work (Addendum)
4:10 pm Patient's daughters, [name] and Garnet Koyanagi were at bedside. CSW met with daughters and discussed disposition planning. Daughters concerned about patient going home. Explained that patient cleared by psych to make her own decisions/not a danger to self or others.   Met with patient also and discussed SNF. Patient is open to SNF, but conitnues to state, "I'll either go to rehab or I'll hire someone at home [for 24/7 care]." Patient's daughters indicated she has said before that she'll hire caregivers and then doesn't.   Patient's referral was faxed out yesterday and today. Patient only has one SNF bed offer, and that is from a facility out of network with her insurance. Patient's insurance would cover 60% and patient would be responsible for 40%. CSW has placed calls/left messages with several in-network SNFs, awaiting calls back for bed offers. Will need [facility] once facility identified.  PASRR also remains pending. Faxed clinicals to PASRR for review.  11:22 am CSW attempted to meet with patient at bedside. Patient would rouse momentarily to CSW voice, but fell to sleep and did not engage in conversation.   SNF referrals were faxed out on patient yesterday, as patient was agreeable to SNF. Patient does not yet have any bed offers. Facility will need to obtain [facility] once identified.   PASRR is pending due to mental health history.  CSW to follow.  Estanislado Emms, LCSW [phone]

## 2018-03-27 NOTE — Progress Notes (Signed)
Patient saturations on Room Air at rest = 90% Patient saturations on Room Air while ambulating = 86% Patient saturations on 2 liters of oxygen while ambulating = 92%

## 2018-03-27 NOTE — Progress Notes (Signed)
PT Cancellation Note  Patient Details Name: Anne Black MRN: [id number] DOB: 01-16-1961   Cancelled Treatment:    Reason Eval/Treat Not Completed: Patient's level of consciousness.  Pt could not remain awake to work with her so will try again at another time. SW is working on a placement in SNF.   Ivar Drape 03/27/2018, 12:21 PM   Samul Dada, PT MS Acute Rehab Dept. Number: [facility] [id number] and [facility] [phone]

## 2018-03-27 NOTE — [place] FL2 (Signed)
[place] MEDICAID FL2 LEVEL OF CARE SCREENING TOOL     IDENTIFICATION  Patient Name: Anne Black Birthdate: [birth year] Sex: female Admission Date (Current Location): 03/23/2018  [facility] and [place] Number:  Producer, television/film/video and Address:  [facility], [address], [place], [place] [address]      Provider Number: [id number]  Attending Physician Name and Address:  Rolly Salter, MD  Relative Name and Phone Number:       Current Level of Care: Hospital Recommended Level of Care: Skilled Nursing Facility Prior Approval Number:    Date Approved/Denied:   PASRR Number:    Discharge Plan: SNF    Current Diagnoses: Patient Active Problem List   Diagnosis Date Noted  . Atrial fibrillation with rapid ventricular response (HCC) 03/23/2018  . Head injury   . Fall 03/19/2018  . Syncope 03/19/2018  . Orbital fracture 03/19/2018  . Scalp laceration 03/19/2018  . Acute respiratory failure with hypoxia and hypercarbia (HCC) 03/19/2018  . Abdominal tenderness 03/19/2018  . Acute on chronic respiratory failure with hypoxia (HCC) 03/17/2018  . Alcohol abuse with intoxication (HCC) 03/17/2018  . ETOH abuse   . Normocytic anemia 03/16/2018  . Acute metabolic encephalopathy 03/16/2018  . Lower leg edema 03/16/2018  . Migraine 03/16/2018  . A-fib (HCC) 02/18/2018  . Community acquired pneumonia of left lower lobe of lung (HCC)   . Weakness 08/21/2017  . COPD exacerbation (HCC) 06/12/2017  . Chest pain with moderate risk for cardiac etiology 05/30/2017  . Abnormal LFTs 05/30/2017  . Tachycardia 05/30/2017  . COPD (chronic obstructive pulmonary disease) (HCC) 04/19/2017  . Right rib fracture 04/15/2017  . Abdominal wall cellulitis 12/30/2016  . S/P exploratory laparotomy 12/30/2016  . Distal radius fracture, left 09/09/2016  . Thrombocytopenia (HCC) 09/09/2016  . Chest x-ray abnormality   . Umbilical hernia without obstruction and without  gangrene 05/12/2016  . Itching 02/24/2016  . Ventral hernia without obstruction or gangrene 02/24/2016  . Palliative care encounter   . Ascites   . Tachypnea   . Atrial flutter with rapid ventricular response (HCC)   . Hypomagnesemia   . Hypoxia   . Dehydration 01/22/2016  . Low back ache 12/30/2015  . Non-intractable vomiting with nausea   . Alcohol abuse with alcohol-induced mood disorder (HCC) 12/15/2015  . Acute alcoholism (HCC)   . Abdominal distension 11/04/2015  . Alcohol abuse   . Dyspnea   . Acute respiratory failure (HCC) 10/15/2015  . Ascites due to alcoholic cirrhosis (HCC) 10/15/2015  . Osteoporosis   . Multiple sclerosis (HCC)   . Alcoholic cirrhosis of liver with ascites (HCC) 08/22/2015  . Bipolar disorder, current episode mixed, moderate (HCC) 08/01/2015  . Alcohol use disorder, severe, dependence (HCC) 07/31/2015  . Macrocytic anemia- due to alcohol abuse with normal B12 & folate levels 05/31/2015  . Severe protein-calorie malnutrition (HCC) 05/31/2015  . Hypokalemia 05/26/2015  . Encephalopathy, hepatic (HCC) 05/26/2015  . Alcohol withdrawal (HCC) 05/18/2015  . Abdominal pain 05/18/2015  . Anxiety 05/18/2015  . Severe sepsis (HCC) 05/18/2015  . UTI (lower urinary tract infection) 05/18/2015  . Stimulant abuse (HCC) 12/27/2014  . Nicotine abuse 12/27/2014  . Hyperprolactinemia (HCC) 11/15/2014  . Dyslipidemia   . Alcohol use disorder, moderate, dependence (HCC) 10/30/2014  . Tobacco abuse 01/23/2014  . Bipolar 1 disorder (HCC) 04/07/2013  . Noncompliance with therapeutic plan 04/04/2013  . Hereditary and idiopathic peripheral neuropathy 05/01/2012  . Depression 05/01/2012  . GERD (gastroesophageal  reflux disease) 05/01/2012  . Osteoarthrosis, unspecified whether generalized or localized, involving lower leg 05/01/2012  . Hyponatremia 07/01/2011    Orientation RESPIRATION BLADDER Height & Weight     Self, Time, Situation, Place  O2(nasal cannula 2L)  Incontinent Weight: 79.1 kg Height:  5\' 2"  (157.5 cm)  BEHAVIORAL SYMPTOMS/MOOD NEUROLOGICAL BOWEL NUTRITION STATUS      Continent Diet(please see DC summary)  AMBULATORY STATUS COMMUNICATION OF NEEDS Skin   Limited Assist Verbally Normal                       Personal Care Assistance Level of Assistance  Bathing, Feeding, Dressing Bathing Assistance: Limited assistance Feeding assistance: Independent Dressing Assistance: Limited assistance     Functional Limitations Info  Sight, Hearing, Speech Sight Info: Adequate Hearing Info: Adequate Speech Info: Adequate    SPECIAL CARE FACTORS FREQUENCY  PT (By licensed PT)     PT Frequency: 5x/week              Contractures Contractures Info: Not present    Additional Factors Info  Code Status, Allergies, Psychotropic Code Status Info: Full Allergies Info: No Known Allergies Psychotropic Info: prozac         Current Medications (03/27/2018):  This is the current hospital active medication list Current Facility-Administered Medications  Medication Dose Route Frequency Provider Last Rate Last Dose  . acetaminophen (TYLENOL) tablet 650 mg  650 mg Oral Q6H PRN Rolly Salter, MD   650 mg at 03/27/18 0855  . albuterol (PROVENTIL) (2.5 MG/3ML) 0.083% nebulizer solution 2.5 mg  2.5 mg Nebulization Q4H PRN Elyse Hsu, MD      . alum & mag hydroxide-simeth (MAALOX/MYLANTA) 200-200-20 MG/5ML suspension 30 mL  30 mL Oral QID PRN Elyse Hsu, MD      . bisacodyl (DULCOLAX) EC tablet 5 mg  5 mg Oral Daily PRN Rolly Salter, MD      . bisacodyl (DULCOLAX) suppository 10 mg  10 mg Rectal Daily PRN Elyse Hsu, MD      . diltiazem (CARDIZEM CD) 24 hr capsule 120 mg  120 mg Oral Daily Rolly Salter, MD   120 mg at 03/27/18 0853  . docusate sodium (COLACE) capsule 100 mg  100 mg Oral BID PRN Elyse Hsu, MD   100 mg at 03/27/18 0019  . enoxaparin (LOVENOX) injection 40 mg  40 mg Subcutaneous Q24H Elyse Hsu, MD   40 mg at 03/26/18 2122  . FLUoxetine (PROZAC) capsule 20 mg  20 mg Oral Daily Elyse Hsu, MD   20 mg at 03/27/18 0854  . folic acid (FOLVITE) tablet 1 mg  1 mg Oral Daily Rolly Salter, MD   1 mg at 03/27/18 0854  . furosemide (LASIX) injection 40 mg  40 mg Intravenous BID Rolly Salter, MD   40 mg at 03/27/18 0849  . gabapentin (NEURONTIN) capsule 300 mg  300 mg Oral TID Elyse Hsu, MD   300 mg at 03/27/18 0854  . guaiFENesin (ROBITUSSIN) 100 MG/5ML solution 200 mg  10 mL Oral Q4H PRN Rolly Salter, MD      . hydrocortisone cream 1 % 1 application  1 application Topical Daily PRN Elyse Hsu, MD      . hydrOXYzine (ATARAX/VISTARIL) tablet 25 mg  25 mg Oral Daily PRN Elyse Hsu, MD   25 mg at 03/25/18 1308  . ipratropium-albuterol (DUONEB) 0.5-2.5 (3) MG/3ML nebulizer  solution 3 mL  3 mL Nebulization Q4H PRN Elyse Hsu, MD      . lactulose (CHRONULAC) 10 GM/15ML solution 20 g  20 g Oral BID Elyse Hsu, MD      . LORazepam (ATIVAN) injection 0-4 mg  0-4 mg Intravenous Q6H Rolly Salter, MD   4 mg at 03/27/18 0021   Followed by  . LORazepam (ATIVAN) injection 0-4 mg  0-4 mg Intravenous Q12H Rolly Salter, MD      . LORazepam (ATIVAN) tablet 1 mg  1 mg Oral Q6H PRN Rolly Salter, MD   1 mg at 03/25/18 1151   Or  . LORazepam (ATIVAN) injection 1 mg  1 mg Intravenous Q6H PRN Rolly Salter, MD      . mometasone-formoterol [facility]) 200-5 MCG/ACT inhaler 2 puff  2 puff Inhalation BID Elyse Hsu, MD   2 puff at 03/27/18 1056  . multivitamin with minerals tablet 1 tablet  1 tablet Oral Daily Rolly Salter, MD   1 tablet at 03/27/18 [phone]  . neomycin-polymyxin-hydrocortisone (CORTISPORIN) OTIC (EAR) solution 3 drop  3 drop Both EARS TID Elyse Hsu, MD   3 drop at 03/27/18 0855  . nicotine (NICODERM CQ - dosed in mg/24 hours) patch 21 mg  21 mg Transdermal Daily Bodenheimer, Charles A, NP   21 mg at 03/27/18 0856  . ondansetron  (ZOFRAN-ODT) disintegrating tablet 4 mg  4 mg Oral Q8H PRN Elyse Hsu, MD   4 mg at 03/26/18 2122  . pantoprazole (PROTONIX) EC tablet 40 mg  40 mg Oral Daily Elyse Hsu, MD   40 mg at 03/27/18 0855  . polyethylene glycol (MIRALAX / GLYCOLAX) packet 17 g  17 g Oral Daily Audrea Muscat T, NP   17 g at 03/27/18 0855  . promethazine (PHENERGAN) tablet 25 mg  25 mg Oral Q6H PRN Elyse Hsu, MD   25 mg at 03/23/18 2203  . spironolactone (ALDACTONE) tablet 25 mg  25 mg Oral Daily Rolly Salter, MD   25 mg at 03/27/18 0854  . thiamine (VITAMIN B-1) tablet 100 mg  100 mg Oral Daily Rolly Salter, MD   100 mg at 03/27/18 9450   Or  . thiamine (B-1) injection 100 mg  100 mg Intravenous Daily Rolly Salter, MD         Discharge Medications: Please see discharge summary for a list of discharge medications.  Relevant Imaging Results:  Relevant Lab Results:   Additional Information SSN: [id number]  Abigail Butts, LCSW

## 2018-03-27 NOTE — Progress Notes (Signed)
[facility] Progress Note  Patient: Anne Black CBJ:[id number]   PCP: System, Pcp Not In DOB: 1960/03/18   DOA: 03/23/2018   DOS: 03/27/2018   Date of Service: the patient was seen and examined on 03/27/2018  Brief hospital course: Pt. with PMH of chronic alcoholism, cirrhosis, bipolar disorder, COPD, ADHD, depression, paroxysmal atrial fibrillation; admitted on 03/23/2018, presented with complaint of unreponsive event, was found to have A. fib with RVR. Currently further plan is continue current treatment.  Subjective: still shortnes of breath and abdominal distension, no nausea.   Assessment and Plan: Paroxysmal atrial fibrillation with rapid ventricular response now sinus. Syncope. Blood pressure is stable.  This is likely been brought on by excessive alcohol use and likely withdrawal as well as dehydration in the setting of being found down for more than 24 hours. -Continue diltiazem switch from infusion to p.o.   Patient has not been on any home oral rate control medications as her atrial fibrillation has been paroxysmal and usually in the setting of alcohol withdrawal.  Alcohol use disorder, in acute withdrawal here  -Continue CIWA protocol -Continue multivitamin, folate, thiamine -She was IVC signed by the magistrate.  Continue tele monitoring.  -Psychiatry consulted and patient is cleared. -Patient is at risk for leaving hospital AMA, counseled again for not leaving AMA.  Cirrhosis due to alcoholic liver disease Acute on chronic diastolic CHF -Will continue spironolactone given her hypokalemia. -Patient was started on oral Lasix without any significant diuresis. -We will transition to IV Lasix.  Monitor. -Continue oral lactulose  Hypokalemia -Replaced.  Will monitor.  Continue Aldactone.  Bipolar disorder, depression -Continue home Prozac  COPD, no exacerbation currently -Continue home fluticasone/salmeterol, duo nebs, Ventolin as needed  GERD -Continue  PPI  Social issues. Patient's primary issue is alcoholism.  Patient has frequent ER visits secondary to complications from chronic alcoholism.  Family is very concerned about patient's outcome as the patient's behavior at home concerns them with continues use of alcohol despite significant risk to her health.  Psychiatry evaluated the patient and patient does have capacity to make medical decision.  Discussed with social worker given that the patient does have the capacity and is not at imminent risk for harming herself unable to provide any further assistance at present other than resources for alcohol abuse on discharge. Inform family that the patient will be discharged once medically stable. Family wanted to talk with psychiatry ,will relay this information to the psychiatrist to discuss with the family.  Diet: cardiac diet DVT Prophylaxis: subcutaneous Heparin  Advance goals of care discussion: full code  Family Communication: family was present at bedside, at the time of interview. Pt refused to allow me to talk with her family.   Disposition:  Discharge to home.  Consultants: psych Procedures: none  Scheduled Meds: . diltiazem  120 mg Oral Daily  . docusate sodium  100 mg Oral BID  . enoxaparin (LOVENOX) injection  40 mg Subcutaneous Q24H  . FLUoxetine  20 mg Oral Daily  . folic acid  1 mg Oral Daily  . furosemide  40 mg Intravenous BID  . gabapentin  300 mg Oral TID  . lactulose  30 g Oral TID  . LORazepam  0-4 mg Intravenous Q12H  . mometasone-formoterol  2 puff Inhalation BID  . multivitamin with minerals  1 tablet Oral Daily  . neomycin-polymyxin-hydrocortisone  3 drop Both EARS TID  . nicotine  21 mg Transdermal Daily  . pantoprazole  40 mg Oral Daily  .  polyethylene glycol  17 g Oral Daily  . spironolactone  25 mg Oral Daily  . thiamine  100 mg Oral Daily   Or  . thiamine  100 mg Intravenous Daily   Continuous Infusions: PRN Meds: acetaminophen, albuterol, alum &  mag hydroxide-simeth, bisacodyl, bisacodyl, docusate sodium, guaiFENesin, hydrocortisone cream, hydrOXYzine, ipratropium-albuterol, LORazepam **OR** LORazepam, ondansetron, promethazine, sodium phosphate, traMADol Antibiotics: Anti-infectives (From admission, onward)   None       Objective: Physical Exam: Vitals:   03/27/18 1000 03/27/18 1057 03/27/18 1300 03/27/18 1735  BP:   (!) 104/50 117/61  Pulse:   80 80  Resp:      Temp:   98.7 F (37.1 C)   TempSrc:   Oral   SpO2:  100%  100%  Weight: 79.1 kg     Height:        Intake/Output Summary (Last 24 hours) at 03/27/2018 2104 Last data filed at 03/27/2018 1837 Gross per 24 hour  Intake 840 ml  Output 2753 ml  Net -1913 ml   Filed Weights   03/26/18 0651 03/27/18 0623 03/27/18 1000  Weight: 83.7 kg 83.4 kg 79.1 kg   General: Alert, Awake and Oriented to Time, Place and Person. Appear in mild distress, affect blunted Eyes: PERRL, Conjunctiva normal ENT: Oral Mucosa clear moist. Neck: no JVD, no Abnormal Mass Or lumps Cardiovascular: S1 and S2 Present, no Murmur, Peripheral Pulses Present Respiratory: normal respiratory effort, Bilateral Air entry equal and Decreased, no use of accessory muscle, Clear to Auscultation, no Crackles, no wheezes Abdomen: Bowel Sound present, Soft and no tenderness, no hernia Skin: no redness, no Rash, no induration Extremities: no Pedal edema, no calf tenderness Neurologic: Grossly no focal neuro deficit. Bilaterally Equal motor strength  Data Reviewed: CBC: Recent Labs  Lab 03/23/18 1549 03/24/18 0321 03/25/18 0447 03/26/18 0524 03/27/18 0728  WBC 7.4 5.4 5.7 5.7 5.8  NEUTROABS 5.4  --   --   --   --   HGB 10.5* 8.6* 9.5* 8.9* 9.0*  HCT 35.8* 29.7* 32.6* 30.9* 31.9*  MCV 84.2 83.4 85.6 84.4 83.9  PLT PLATELET CLUMPS NOTED ON SMEAR, UNABLE TO ESTIMATE 105* 128* 164 176   Basic Metabolic Panel: Recent Labs  Lab 03/23/18 1549 03/24/18 0321 03/25/18 0447 03/26/18 0524  03/27/18 0728  NA 141 139 140 139 139  K 2.7* 3.7 3.7 3.6 4.1  CL 99 99 102 102 101  CO2 29 31 31  32 30  GLUCOSE 114* 106* 100* 120* 111*  BUN <5* <5* <5* 6 7  CREATININE 0.44 0.51 0.40* 0.43* 0.43*  CALCIUM 7.8* 8.0* 8.2* 8.0* 8.3*  MG 1.8  --  2.0  --   --     Liver Function Tests: Recent Labs  Lab 03/23/18 1549  AST 39  ALT 25  ALKPHOS 92  BILITOT 0.7  PROT 5.6*  ALBUMIN 2.9*   No results for input(s): LIPASE, AMYLASE in the last 168 hours. Recent Labs  Lab 03/23/18 1549  AMMONIA 28   Coagulation Profile: Recent Labs  Lab 03/23/18 1549  INR 1.25   Cardiac Enzymes: Recent Labs  Lab 03/23/18 1549  CKTOTAL 147  TROPONINI <0.03   BNP (last 3 results) No results for input(s): PROBNP in the last 8760 hours. CBG: No results for input(s): GLUCAP in the last 168 hours. Studies: No results found.   Time spent: 35 minutes  Author: Lynden Oxford, MD [facility] 03/27/2018 9:04 PM  To reach On-call, see care teams to locate  the attending and reach out to them via [email or web address]. If 7PM-7AM, please contact night-coverage If you still have difficulty reaching the attending provider, please page the [facility] (Director on Call) for [facility] on amion for assistance.

## 2018-03-28 LAB — CBC
HCT: 32 % — ABNORMAL LOW (ref 36.0–46.0)
Hemoglobin: 9 g/dL — ABNORMAL LOW (ref 12.0–15.0)
MCH: 23.6 pg — ABNORMAL LOW (ref 26.0–34.0)
MCHC: 28.1 g/dL — ABNORMAL LOW (ref 30.0–36.0)
MCV: 84 fL (ref 80.0–100.0)
Platelets: 178 10*3/uL (ref 150–400)
RBC: 3.81 MIL/uL — ABNORMAL LOW (ref 3.87–5.11)
RDW: 17.2 % — ABNORMAL HIGH (ref 11.5–15.5)
WBC: 6.8 10*3/uL (ref 4.0–10.5)
nRBC: 0 % (ref 0.0–0.2)

## 2018-03-28 LAB — BASIC METABOLIC PANEL
Anion gap: 8 (ref 5–15)
BUN: 7 mg/dL (ref 6–20)
CHLORIDE: 100 mmol/L (ref 98–111)
CO2: 31 mmol/L (ref 22–32)
Calcium: 8.6 mg/dL — ABNORMAL LOW (ref 8.9–10.3)
Creatinine, Ser: 0.42 mg/dL — ABNORMAL LOW (ref 0.44–1.00)
GFR calc Af Amer: >60 mL/min (ref >60)
GFR calc non Af Amer: >60 mL/min (ref >60)
Glucose, Bld: 112 mg/dL — ABNORMAL HIGH (ref 70–99)
Potassium: 3.7 mmol/L (ref 3.5–5.1)
Sodium: 139 mmol/L (ref 135–145)

## 2018-03-28 MED ORDER — DILTIAZEM HCL ER COATED BEADS 120 MG PO CP24: 120.0000 mg | ORAL_CAPSULE | Freq: Every day | ORAL | 0 refills | Status: DC

## 2018-03-28 MED ORDER — THIAMINE HCL 100 MG PO TABS: 100.0000 mg | ORAL_TABLET | Freq: Every day | ORAL | 0 refills | Status: DC

## 2018-03-28 MED ORDER — ACETAMINOPHEN 500 MG PO TABS: 500.0000 mg | ORAL_TABLET | Freq: Four times a day (QID) | ORAL | 0 refills | Status: DC | PRN

## 2018-03-28 MED ORDER — LACTULOSE 10 GM/15ML PO SOLN: 30.0000 g | Freq: Three times a day (TID) | ORAL | 0 refills | Status: DC

## 2018-03-28 MED ORDER — FLUOXETINE HCL 20 MG PO CAPS: 20.0000 mg | ORAL_CAPSULE | Freq: Every day | ORAL | 0 refills | Status: DC

## 2018-03-28 MED ORDER — SPIRONOLACTONE 25 MG PO TABS: 25.0000 mg | ORAL_TABLET | Freq: Every day | ORAL | 0 refills | Status: DC

## 2018-03-28 MED ORDER — HYDROCORTISONE 1 % EX CREA: TOPICAL_CREAM | Freq: Three times a day (TID) | CUTANEOUS | 0 refills | Status: DC

## 2018-03-28 MED ORDER — NICOTINE 21 MG/24HR TD PT24: 21.0000 mg | MEDICATED_PATCH | Freq: Every day | TRANSDERMAL | 0 refills | Status: DC

## 2018-03-28 MED ORDER — FUROSEMIDE 40 MG PO TABS: ORAL_TABLET | ORAL | 0 refills | Status: DC

## 2018-03-28 NOTE — Social Work (Signed)
Patient's PASRR is under QMHP review; patient would need to be evaluated in person for PASRR.   CSW met with patient at bedside. Patient only has two SNF bed offers - [facility] and [facility] in [place]. [facility] is out of network, insurance would only cover 60%. Patient stated she wanted to think about [facility].   Was then informed by [facility] that patient has chosen to go home with home health.  Cancelled PASRR.   CSW made APS report on patient and concerns for patient at home.  CSW will sign off as patient will return home.   Estanislado Emms, LCSW [phone]

## 2018-03-28 NOTE — Plan of Care (Signed)
  Problem: Clinical Measurements: Goal: Will remain free from infection Outcome: Progressing Note:  No s/s of infection noted. Goal: Respiratory complications will improve Outcome: Progressing Note:  No s/s of respiratory complications noted.  Stable on 2L/[place].   Problem: Elimination: Goal: Will not experience complications related to urinary retention Outcome: Progressing Note:  No s/s of urinary retention.  Adequate UOP.   Problem: Pain Managment: Goal: General experience of comfort will improve Outcome: Progressing Note:  No s/s of discomfort; no c/o pain.

## 2018-03-28 NOTE — Progress Notes (Signed)
Attempted to review discharge paperwork and new medications with pt. Pt refused and stated that she didn't want the RN to review paperwork with her. Head staples removed successfully. Home O2 delivered to room. Pt to home via taxi voucher.

## 2018-03-28 NOTE — Discharge Summary (Signed)
[facility] Discharge Summary   Patient: Anne Black ZOX:[id number]   PCP: System, Pcp Not In DOB: 1960-12-09   Date of admission: 03/23/2018   Date of discharge: 03/28/2018    Discharge Diagnoses:  Principal Problem:   Alcohol use disorder, severe, dependence (HCC) Active Problems:   GERD (gastroesophageal reflux disease)   Noncompliance with therapeutic plan   Bipolar 1 disorder (HCC)   Tobacco abuse   Alcohol use disorder, moderate, dependence (HCC)   Hypokalemia   COPD (chronic obstructive pulmonary disease) (HCC)   Atrial fibrillation with rapid ventricular response (HCC)   Admitted From: home Disposition:  Home with home health  Recommendations for Outpatient Follow-up:  1. Please follow-up with PCP in 1 week. 2. Patient will also follow-up with ENT as recommended during last admission.  Follow-up Information    PCP. Schedule an appointment as soon as possible for a visit in 1 week(s).        [facility] Follow up.   Specialty:  Home Health Services Why:  Registered Nurse, Physical Therapy, Aide, Child psychotherapist.  Contact information: [address] [place] [address] [phone]        Bing Neighbors, FNP Follow up on 04/20/2018.   Specialty:  Family Medicine Why:  8:50 am for hospital follow up appointment and etablishment. Please call if you need to reschedule this appointment.  Contact information: [address] [place] [address] [phone]        [facility] - Dme Follow up.   Why:  Oxygen  Contact information: [address] [place] [address] [phone]          Diet recommendation: Cardiac diet  Activity: The patient is advised to gradually reintroduce usual activities.  Discharge Condition: good  Code Status: Full code  History of present illness: As per the H and P dictated on admission, "Anne Black is a 58 y.o. female with medical history significant for  chronic alcoholism, cirrhosis, bipolar disorder, COPD, ADHD, depression, paroxysmal atrial fibrillation who presented to the ED today after being found down in her home.  Patient was originally brought here to the ED under IVC from her family.  According to them she has completely stopped taking care of herself in her home.  She was found down in her house after she fell and was unable to get up for over 24 hours.  By report she continues to drink on a daily basis and had soiled her clothing and there was soiled clothing all around the house.  Current complaints are that she is mildly short of breath and nauseated.  She denies any pain or other complaints.  She denies palpitations, chest pain.  She does have a history of atrial fibrillation but does not take any of her medications for it nor does she take any of her other medications.  Initially she refused to be brought to the ED.  Upon arrival she denies any SI, HI or hallucinations.  She agreed to be hospitalized and treated.   Patient has been hospitalized multiple times in the last year for alcohol related illnesses, most commonly alcohol withdrawal but also hepatic encephalopathy and ascites."  Hospital Course:  Summary of her active problems in the hospital is as following. Paroxysmal atrial fibrillation with rapid ventricular response now sinus. Syncope. Blood pressure is stable. This is likely been brought on by excessive alcohol use and likely withdrawal as well as dehydration in the setting of being  found down for more than 24 hours. -Started on diltiazem switch from infusion to p.o.   Patient has not been on any home oral rate control medications as her atrial fibrillation has been paroxysmal and usually in the setting of alcohol withdrawal.  Alcohol use disorder,inacute withdrawal here  -Continue CIWA protocol -Continue multivitamin, folate, thiamine -She was IVC signed by the magistrate.  -Psychiatry consulted and patient is  cleared.  Cirrhosis due to alcoholicliver disease Acute on chronic diastolic CHF -Will continue spironolactone given her hypokalemia. -Patient was started on oral Lasix without any significant diuresis. -Transition to IV Lasix with significant improvement in volume.  Negative will 9.5 L in the hospital.  Down from 187 pounds to 167 pounds. -Continue oral lactulose and Lasix and Aldactone.  Hypokalemia -Replaced.  Will monitor.  Continue Aldactone.  Bipolar disorder, depression -Continue home Prozac Patient was requesting her old psychiatric medication which she has stopped taking.  Recommended patient to follow-up with her primary psychiatrist for further refills or new prescriptions.  COPD, no exacerbation currently -Continue home fluticasone/salmeterol, duo nebs, Ventolin as needed  GERD -Continue PPI  Social issues. Patient's primary issue is alcoholism.  Patient has frequent ER visits secondary to complications from chronic alcoholism.  Family is very concerned about patient's outcome as the patient's behavior at home concerns them with continues use of alcohol despite significant risk to her health.  Psychiatry evaluated the patient and patient does have capacity to make medical decision.  Discussed with social worker given that the patient does have the capacity and is not at imminent risk for harming herself unable to provide any further assistance at present other than resources for alcohol abuse on discharge. Inform family that the patient will be discharged once medically stable.   Patient was seen by physical therapy, who recommended SNF, which pt refused, home health was arranged by case manager. On the day of the discharge the patient's vitals were stable , and no other acute medical condition were reported by patient. the patient was felt safe to be discharge at home with home health.  Consultants: Psychiatry Procedures: none  DISCHARGE MEDICATION: Allergies as  of 03/28/2018   No Known Allergies     Medication List    STOP taking these medications   azithromycin 250 MG tablet Commonly known as:  ZITHROMAX   chlordiazePOXIDE 25 MG capsule Commonly known as:  LIBRIUM   ibuprofen 200 MG tablet Commonly known as:  ADVIL,MOTRIN   loperamide 2 MG tablet Commonly known as:  IMODIUM A-D   neomycin-polymyxin-hydrocortisone OTIC solution Commonly known as:  CORTISPORIN     TAKE these medications   acetaminophen 500 MG tablet Commonly known as:  TYLENOL Take 1 tablet (500 mg total) by mouth every 6 (six) hours as needed for mild pain. What changed:  how much to take   aluminum-magnesium hydroxide-simethicone 200-200-20 MG/5ML Susp Commonly known as:  MAALOX Take 30 mLs by mouth 4 (four) times daily as needed (indigestion).   diltiazem 120 MG 24 hr capsule Commonly known as:  CARDIZEM CD Take 1 capsule (120 mg total) by mouth daily. Start taking on:  March 29, 2018   FLUoxetine 20 MG capsule Commonly known as:  PROZAC Take 1 capsule (20 mg total) by mouth daily.   folic acid 1 MG tablet Commonly known as:  FOLVITE Take 1 tablet (1 mg total) by mouth daily.   furosemide 40 MG tablet Commonly known as:  LASIX Take 40 mg twice daily till 04/02/2018, after that  take 40 mg daily. What changed:    how much to take  how to take this  when to take this  additional instructions   gabapentin 100 MG capsule Commonly known as:  NEURONTIN Take 2 capsules (200 mg total) by mouth 3 (three) times daily.   hydrocortisone cream 1 % Apply topically 3 (three) times daily.   hydrOXYzine 25 MG tablet Commonly known as:  ATARAX/VISTARIL Take 1 tablet (25 mg total) by mouth daily as needed (anxiety).   ipratropium-albuterol 0.5-2.5 (3) MG/3ML Soln Commonly known as:  DUONEB Take 3 mLs by nebulization every 4 (four) hours as needed (For wheezing or shortness of breath.).   lactulose 10 GM/15ML solution Commonly known as:   CHRONULAC Take 45 mLs (30 g total) by mouth 3 (three) times daily. What changed:    how much to take  when to take this   nicotine 21 mg/24hr patch Commonly known as:  NICODERM CQ - dosed in mg/24 hours Place 1 patch (21 mg total) onto the skin daily. Start taking on:  March 29, 2018   ondansetron 4 MG disintegrating tablet Commonly known as:  ZOFRAN-ODT Take 4 mg by mouth every 8 (eight) hours as needed for nausea or vomiting.   pantoprazole 40 MG tablet Commonly known as:  PROTONIX Take 1 tablet (40 mg total) by mouth daily.   promethazine 25 MG tablet Commonly known as:  PHENERGAN Take 1 tablet (25 mg total) by mouth every 6 (six) hours as needed for nausea or vomiting.   rizatriptan 10 MG tablet Commonly known as:  MAXALT Take 10 mg by mouth as needed for migraine. May repeat in 2 hours if needed   spironolactone 25 MG tablet Commonly known as:  ALDACTONE Take 1 tablet (25 mg total) by mouth daily for 30 days.   thiamine 100 MG tablet Take 1 tablet (100 mg total) by mouth daily.   VENTOLIN HFA 108 (90 Base) MCG/ACT inhaler Generic drug:  albuterol Inhale 2 puffs into the lungs every 4 (four) hours as needed for wheezing or shortness of breath.   WIXELA INHUB 250-50 MCG/DOSE Aepb Generic drug:  Fluticasone-Salmeterol Inhale 1 puff into the lungs 2 (two) times daily.            Durable Medical Equipment  (From admission, onward)         Start     Ordered   03/28/18 1316  For home use only DME oxygen  Once    Question Answer Comment  Mode or (Route) Nasal cannula   Liters per Minute 2   Frequency Continuous (stationary and portable oxygen unit needed)   Oxygen delivery system Gas      03/28/18 1315         No Known Allergies Discharge Instructions    Diet - low sodium heart healthy   Complete by:  As directed    Diet - low sodium heart healthy   Complete by:  As directed    Discharge instructions   Complete by:  As directed    It is  important that you read the given instructions as well as go over your medication list with RN to help you understand your care after this hospitalization.  Discharge Instructions: Please follow-up with PCP in 1-2 weeks  Please request your primary care physician to go over all Hospital Tests and Procedure/Radiological results at the follow up. Please get all Hospital records sent to your PCP by signing hospital release before you go home.  Do not drive, operating heavy machinery, perform activities at heights, swimming or participation in water activities or provide baby sitting services; until you have been seen by Primary Care Physician or a Neurologist and advised to do so again. Do not take more than prescribed Pain, Sleep and Anxiety Medications. You were cared for by a hospitalist during your hospital stay. If you have any questions about your discharge medications or the care you received while you were in the hospital after you are discharged, you can call the unit @UNIT @ you were admitted to and ask to speak with the hospitalist on call if the hospitalist that took care of you is not available.  Once you are discharged, your primary care physician will handle any further medical issues. Please note that NO REFILLS for any discharge medications will be authorized once you are discharged, as it is imperative that you return to your primary care physician (or establish a relationship with a primary care physician if you do not have one) for your aftercare needs so that they can reassess your need for medications and monitor your lab values. You Must read complete instructions/literature along with all the possible adverse reactions/side effects for all the Medicines you take and that have been prescribed to you. Take any new Medicines after you have completely understood and accept all the possible adverse reactions/side effects. Wear Seat belts while driving. If you have smoked or chewed  Tobacco in the last 2 yrs please stop smoking and/or stop any Recreational drug use.  If you drink alcohol, please stop the use and do not drive, operating heavy machinery, perform activities at heights, swimming or participation in water activities or provide baby sitting services under influence.   Discharge instructions   Complete by:  As directed    It is important that you read the given instructions as well as go over your medication list with RN to help you understand your care after this hospitalization.  Discharge Instructions: Please follow-up with PCP in 1-2 weeks  Please request your primary care physician to go over all Hospital Tests and Procedure/Radiological results at the follow up. Please get all Hospital records sent to your PCP by signing hospital release before you go home.   Do not drive, operating heavy machinery, perform activities at heights, swimming or participation in water activities or provide baby sitting services; until you have been seen by Primary Care Physician or a Neurologist and advised to do so again. Do not take more than prescribed Pain, Sleep and Anxiety Medications. You were cared for by a hospitalist during your hospital stay. If you have any questions about your discharge medications or the care you received while you were in the hospital after you are discharged, you can call the unit @UNIT @ you were admitted to and ask to speak with the hospitalist on call if the hospitalist that took care of you is not available.  Once you are discharged, your primary care physician will handle any further medical issues. Please note that NO REFILLS for any discharge medications will be authorized once you are discharged, as it is imperative that you return to your primary care physician (or establish a relationship with a primary care physician if you do not have one) for your aftercare needs so that they can reassess your need for medications and monitor your lab  values. You Must read complete instructions/literature along with all the possible adverse reactions/side effects for all the Medicines you take and that have been  prescribed to you. Take any new Medicines after you have completely understood and accept all the possible adverse reactions/side effects. Wear Seat belts while driving. If you have smoked or chewed Tobacco in the last 2 yrs please stop smoking and/or stop any Recreational drug use.  If you drink alcohol, please STOP the use and do not drive, operating heavy machinery, perform activities at heights, swimming or participation in water activities or provide baby sitting services under influence.   Increase activity slowly   Complete by:  As directed    Increase activity slowly   Complete by:  As directed      Discharge Exam: Filed Weights   03/27/18 1000 03/28/18 0435 03/28/18 0900  Weight: 79.1 kg 79.1 kg 76.2 kg   Vitals:   03/28/18 0749 03/28/18 0835  BP: (!) 115/50 123/62  Pulse: 78   Resp: 18   Temp: 98.7 F (37.1 C)   SpO2: 95%    General: Appear in no distress, no Rash; Oral Mucosa moist. Cardiovascular: S1 and S2 Present, no Murmur, no JVD Respiratory: Bilateral Air entry present and Clear to Auscultation, no Crackles, no wheezes Abdomen: Bowel Sound present, Soft and distendend tenderness Extremities: no Pedal edema, no calf tenderness Neurology: Grossly no focal neuro deficit.  The results of significant diagnostics from this hospitalization (including imaging, microbiology, ancillary and laboratory) are listed below for reference.    Significant Diagnostic Studies: Dg Chest 2 View  Result Date: 03/19/2018 CLINICAL DATA:  Found on ground. EXAM: CHEST - 2 VIEW COMPARISON:  03/16/2018 FINDINGS: The heart is upper limits of normal in size given the AP projection and portable technique. No change since prior study. The lungs are clear except for streaky basilar atelectasis. No pleural effusion or pneumothorax. The  bony thorax is intact. IMPRESSION: Streaky basilar atelectasis but no infiltrates, edema or effusions. Electronically Signed   By: Rudie Meyer M.D.   On: 03/19/2018 05:45   Dg Chest 2 View  Result Date: 02/28/2018 CLINICAL DATA:  Coughing and vomiting. EXAM: CHEST - 2 VIEW COMPARISON:  02/23/2018. FINDINGS: Mediastinum and hilar structures normal. Heart size normal. Mild peribronchial cuffing. No focal infiltrate. No pleural effusion or pneumothorax. No acute bony abnormality. IMPRESSION: Mild peribronchial cuffing. Mild bronchitis could not be excluded. Exam is otherwise unremarkable. Electronically Signed   By: Maisie Fus  Register   On: 02/28/2018 10:24   Ct Head Wo Contrast  Result Date: 03/23/2018 CLINICAL DATA:  Altered mental status EXAM: CT HEAD WITHOUT CONTRAST TECHNIQUE: Contiguous axial images were obtained from the base of the skull through the vertex without intravenous contrast. COMPARISON:  03/18/2018 FINDINGS: Brain: No evidence of acute infarction, hemorrhage, hydrocephalus, extra-axial collection or mass lesion/mass effect. Periventricular white matter hypodensity. Vascular: No hyperdense vessel or unexpected calcification. Skull: Normal. Negative for fracture or focal lesion. Sinuses/Orbits: Redemonstrated displaced fracture of the right orbital floor, better evaluated by recent dedicated CT examination of the facial bones. Other: None. IMPRESSION: 1. No acute intracranial pathology. Small-vessel white matter disease. 2. Redemonstrated displaced fracture of the right orbital floor, better evaluated by recent dedicated CT examination of the facial bones. Electronically Signed   By: Lauralyn Primes M.D.   On: 03/23/2018 16:41   Ct Head Wo Contrast  Result Date: 03/18/2018 CLINICAL DATA:  Syncopal episode. Posterior head laceration. Found down on floor. Multiple sclerosis. EXAM: CT HEAD WITHOUT CONTRAST TECHNIQUE: Contiguous axial images were obtained from the base of the skull through the  vertex without intravenous contrast. COMPARISON:  03/16/2018 head  CT. FINDINGS: Brain: Nonspecific moderate subcortical and periventricular white matter hypodensity, stable. No evidence of parenchymal hemorrhage or extra-axial fluid collection. No mass lesion, mass effect, or midline shift. No CT evidence of acute infarction. Cerebral volume is age appropriate. No ventriculomegaly. Vascular: No acute abnormality. Skull: No evidence of calvarial fracture. Sinuses/Orbits: There is an acute fracture of right orbital floor with near complete opacification of the right maxillary sinus. No evidence of herniated right orbital muscle. Soft tissue emphysema is noted in. Septal inferior right orbit. Other: Small scalp laceration with associated minimal scalp emphysema in the medial left parietal scalp. The mastoid air cells are unopacified. IMPRESSION: 1. Small scalp laceration with associated minimal scalp emphysema in the medial left parietal scalp. 2. No evidence of acute intracranial abnormality. No evidence of calvarial fracture. 3. Acute fracture of the right orbital floor with near complete opacification of the right maxillary sinus. Soft tissue emphysema in the inferior right orbit. 4. Chronic moderate white matter low-attenuation foci compatible with the provided clinical history of multiple sclerosis. Electronically Signed   By: Delbert Phenix M.D.   On: 03/18/2018 23:34   Ct Head Wo Contrast  Result Date: 03/16/2018 CLINICAL DATA:  Altered level of consciousness. History of alcohol ingestion. Responsive to painful stimuli. EXAM: CT HEAD WITHOUT CONTRAST TECHNIQUE: Contiguous axial images were obtained from the base of the skull through the vertex without intravenous contrast. COMPARISON:  03/19/2017 FINDINGS: Motion artifact limits examination. Brain: Mild diffuse cerebral atrophy. Mild ventricular dilatation consistent with central atrophy. Patchy low-attenuation changes in the deep white matter likely due to  small vessel ischemia. No mass-effect or midline shift. No abnormal extra-axial fluid collections. Gray-white matter junctions are distinct. Basal cisterns are not effaced. No acute intracranial hemorrhage. Vascular: No hyperdense vessel or unexpected calcification. Skull: Calvarium appears intact. No acute depressed skull fractures identified. Sinuses/Orbits: Visualized paranasal sinuses and mastoid air cells are clear. Visualization is severely limited due to motion artifact. Other: None. IMPRESSION: No acute intracranial abnormalities. Chronic atrophy and small vessel ischemic changes. Electronically Signed   By: Burman Nieves M.D.   On: 03/16/2018 21:26   Ct Cervical Spine Wo Contrast  Result Date: 03/19/2018 CLINICAL DATA:  58 y/o F; found on floor with laceration to head and injury to right cheek bone. EXAM: CT MAXILLOFACIAL WITHOUT CONTRAST CT CERVICAL SPINE WITHOUT CONTRAST TECHNIQUE: Multidetector CT imaging of the maxillofacial structures was performed. Multiplanar CT image reconstructions were also generated. A small metallic BB was placed on the right temple in order to reliably differentiate right from left. Multidetector CT imaging of the cervical spine was performed without intravenous contrast. Multiplanar CT image reconstructions were also generated. COMPARISON:  03/18/2018 CT head. FINDINGS: CT MAXILLOFACIAL FINDINGS Osseous: Multiple levels of motion artifact through the face. Acute fracture of the right floor of orbit measuring 12 x 10 mm (AP by ML series 16, image 30 and series 15, image 29) with 6 mm inferior displacement into the right maxillary sinus. No additional displaced facial fracture or mandibular dislocation identified. Orbits: Small amount of emphysema is present within the right inferior palpebral as well as the extraconal fat of the right inferior orbit. No orbital or retro-orbital hematoma. No significant proptosis. Extraocular muscles are intact without findings of  entrapment. Normal left orbit. Sinuses: Right maxillary sinus blood fluid level. Additional visible paranasal sinuses are normally aerated. Under pneumatization of the mastoid air cells. Pneumatized mastoid air cells are normally aerated. Soft tissues: Contusion of right facial and periorbital soft tissues. Limited  intracranial: Please refer to same-day CT head. CT CERVICAL FINDINGS Alignment: Normal. Skull base and vertebrae: Motion artifact at the C2 and C6 levels. No acute fracture identified. No primary bone lesion or focal pathologic process. Soft tissues and spinal canal: No prevertebral fluid or swelling. No visible canal hematoma. Disc levels: Mild multilevel discogenic degenerative changes with small endplate marginal osteophytes. No significant bony foraminal or spinal canal stenosis. Upper chest: Negative. Other: Negative. IMPRESSION: Motion degraded study. 1. Acute fracture of the right floor of orbit measuring up to 12 mm with 6 mm inferior displacement into the right maxillary sinus. Mild infraorbital extraconal pneumatosis. No orbital or retro-orbital hematoma. No significant proptosis. No muscle entrapment. 2. Right maxillary sinus blood fluid level. 3. No additional displaced fracture or dislocation of the facial bones. 4. Contusion of right facial and periorbital soft tissues. 5. No acute fracture or dislocation of the cervical spine. 6. Mild cervical spine degenerative changes. No significant bony foraminal or spinal canal stenosis. Electronically Signed   By: Mitzi Hansen M.D.   On: 03/19/2018 00:38   US Abdomen Complete  Result Date: 03/19/2018 CLINICAL DATA:  Abdominal pain.  Abdominal hernia.  Cirrhosis. EXAM: ABDOMEN ULTRASOUND COMPLETE COMPARISON:  CT of the abdomen and pelvis on 02/28/2018 FINDINGS: Gallbladder: Gallbladder has a normal appearance. Gallbladder wall is 0.9 millimeters, within normal limits. No stones or pericholecystic fluid. Common bile duct: Diameter: 3.4  millimeters Liver: Heterogeneous echotexture. Nodular surface contour. Perihepatic ascites. No focal liver lesions are identified. Portal vein is patent on color Doppler imaging with normal direction of blood flow towards the liver. IVC: No abnormality visualized. Pancreas: Mildly heterogeneous without focal mass. Spleen: Somewhat nodular and enlarged.  No focal splenic lesion. Right Kidney: Length: 11.5 centimeters. Echogenicity within normal limits. No mass or hydronephrosis visualized. Left Kidney: Length: 11.7 centimeter. Echogenicity within normal limits. No mass or hydronephrosis visualized. Abdominal aorta: Visualized portion is not aneurysmal, 2.1 centimeters. Distal aorta is obscured by bowel gas. Other findings: Ascites. IMPRESSION: 1. Ascites. 2. Cirrhotic morphology of the liver. No focal liver lesions identified sonographically. 3. Splenomegaly. 4. Positive sonographic Murphy sign without other ultrasound findings to suggest acute cholecystitis. Findings raise a question of acalculus cholecystitis. Electronically Signed   By: Norva Pavlov M.D.   On: 03/19/2018 08:09   Ct Abdomen Pelvis W Contrast  Result Date: 02/28/2018 CLINICAL DATA:  Abdominal distension EXAM: CT ABDOMEN AND PELVIS WITH CONTRAST TECHNIQUE: Multidetector CT imaging of the abdomen and pelvis was performed using the standard protocol following bolus administration of intravenous contrast. CONTRAST:  ISOVUE-300 IOPAMIDOL (ISOVUE-300) INJECTION 61% COMPARISON:  06/12/2017 FINDINGS: Lower chest: Lung bases are free of acute infiltrate or sizable effusion. Hepatobiliary: Heterogeneity of the liver is noted consistent with underlying cirrhosis. Small hyperdense lesion is noted in the lateral aspect of the left lobe of the liver best seen on image number 17 of series 2 consistent with small hemangioma. This is stable from the prior exam. Mild perihepatic fluid is noted. Pancreas: Unremarkable. No pancreatic ductal dilatation or  surrounding inflammatory changes. Spleen: Spleen is prominent with mild perisplenic varices consistent with portal hypertension. Adrenals/Urinary Tract: Adrenal glands are unremarkable. Kidneys are normal, without renal calculi, focal lesion, or hydronephrosis. Bladder is unremarkable. Stomach/Bowel: Mild diverticular change of the colon is noted without evidence of diverticulitis. The colon is predominately decompressed. The appendix is air-filled and within normal limits. Small bowel shows no obstructive changes. The stomach is within normal limits. Scattered esophageal varices are noted distally. Vascular/Lymphatic: Changes  consistent with portal hypertension are noted as described above. Aortic calcifications are noted without aneurysmal dilatation. Scattered likely reactive lymph nodes are noted adjacent to the celiac axis as well as in the portacaval space and periaortic region. Reproductive: Uterus and bilateral adnexa are unremarkable. Other: Free fluid is noted within the pelvis consistent with ascites and the underlying portal hypertension. Musculoskeletal: Mild degenerative changes of lumbar spine are noted. IMPRESSION: Changes consistent with cirrhosis of the liver with portal hypertension and variceal changes. Mild ascites and splenomegaly are noted as well. Diverticulosis without diverticulitis. Electronically Signed   By: Alcide Clever M.D.   On: 02/28/2018 11:03   US Paracentesis  Result Date: 03/19/2018 INDICATION: Patient with abdominal distention, ascites. Request is made for diagnostic and therapeutic paracentesis. EXAM: ULTRASOUND GUIDED DIAGNOSTIC AND THERAPEUTIC PARACENTESIS MEDICATIONS: 10 mL 1% lidocaine COMPLICATIONS: None immediate. PROCEDURE: Informed written consent was obtained from the patient after a discussion of the risks, benefits and alternatives to treatment. A timeout was performed prior to the initiation of the procedure. Initial ultrasound scanning demonstrates a small  amount of ascites within the right lower abdominal quadrant. The right lower abdomen was prepped and draped in the usual sterile fashion. 1% lidocaine was used for local anesthesia. Following this, a 19 gauge, 7-cm, Yueh catheter was introduced. An ultrasound image was saved for documentation purposes. The paracentesis was performed. The catheter was removed and a dressing was applied. The patient tolerated the procedure well without immediate post procedural complication. FINDINGS: A total of approximately 2.0 L of patent, hazy fluid was removed. Samples were sent to the laboratory as requested by the clinical team. IMPRESSION: Successful ultrasound-guided diagnostic and therapeutic paracentesis yielding 2.0 liters of peritoneal fluid. Read by: Loyce Dys PA-C Electronically Signed   By: Gilmer Mor D.O.   On: 03/19/2018 14:48   Dg Chest Portable 1 View  Result Date: 03/23/2018 CLINICAL DATA:  Patient fell last evening. Bruising of the right eye. Dyspnea. EXAM: PORTABLE CHEST 1 VIEW COMPARISON:  03/19/2018 FINDINGS: The cardiopericardial silhouette is mildly enlarged but stable. Mild aortic atherosclerosis is noted at the arch without aneurysm. Chronic bronchitic change of the lungs without alveolar consolidation effusion or pneumothorax. IMPRESSION: Chronic bronchitic change of the lungs. Mild cardiomegaly with aortic atherosclerosis. Electronically Signed   By: Tollie Eth M.D.   On: 03/23/2018 16:53   Dg Chest Portable 1 View  Result Date: 03/16/2018 CLINICAL DATA:  Poorly responsive after alcohol intake. EXAM: PORTABLE CHEST 1 VIEW COMPARISON:  Chest radiograph February 28, 2018 FINDINGS: Stable cardiomegaly. Interstitial prominence with pulmonary vascular congestion. No pleural effusion or focal consolidation. No pneumothorax. Old LEFT rib fractures. Breast implants. IMPRESSION: Stable cardiomegaly and pulmonary vascular congestion. Interstitial prominence seen with atypical infection and/or  pulmonary edema. Electronically Signed   By: Awilda Metro M.D.   On: 03/16/2018 18:25   Vas [place] Lower Extremity Venous (dvt)  Result Date: 03/17/2018  Lower Venous Study Indications: Edema.  Performing Technologist: Levada Schilling RDMS, RVT  Examination Guidelines: A complete evaluation includes B-mode imaging, spectral Doppler, color Doppler, and power Doppler as needed of all accessible portions of each vessel. Bilateral testing is considered an integral part of a complete examination. Limited examinations for reoccurring indications may be performed as noted.  Right Venous Findings: +---------+---------------+---------+-----------+----------+-------+          CompressibilityPhasicitySpontaneityPropertiesSummary +---------+---------------+---------+-----------+----------+-------+ CFV      Full           Yes      Yes                          +---------+---------------+---------+-----------+----------+-------+  SFJ      Full                                                 +---------+---------------+---------+-----------+----------+-------+ FV Prox  Full                                                 +---------+---------------+---------+-----------+----------+-------+ FV Mid   Full                                                 +---------+---------------+---------+-----------+----------+-------+ FV DistalFull                                                 +---------+---------------+---------+-----------+----------+-------+ PFV      Full                                                 +---------+---------------+---------+-----------+----------+-------+ POP      Full           Yes      Yes                          +---------+---------------+---------+-----------+----------+-------+ PTV      Full                                                 +---------+---------------+---------+-----------+----------+-------+ PERO     Full                                                  +---------+---------------+---------+-----------+----------+-------+ GSV      Full                                                 +---------+---------------+---------+-----------+----------+-------+  Left Venous Findings: +---------+---------------+---------+-----------+----------+-------+          CompressibilityPhasicitySpontaneityPropertiesSummary +---------+---------------+---------+-----------+----------+-------+ CFV      Full           Yes      Yes                          +---------+---------------+---------+-----------+----------+-------+ SFJ      Full                                                 +---------+---------------+---------+-----------+----------+-------+  FV Prox  Full                                                 +---------+---------------+---------+-----------+----------+-------+ FV Mid   Full                                                 +---------+---------------+---------+-----------+----------+-------+ FV DistalFull                                                 +---------+---------------+---------+-----------+----------+-------+ PFV      Full                                                 +---------+---------------+---------+-----------+----------+-------+ POP      Full           Yes      Yes                          +---------+---------------+---------+-----------+----------+-------+ PTV      Full                                                 +---------+---------------+---------+-----------+----------+-------+ PERO     Full                                                 +---------+---------------+---------+-----------+----------+-------+ GSV      Full                                                 +---------+---------------+---------+-----------+----------+-------+    Summary: Right: There is no evidence of deep vein thrombosis in the lower extremity. Left: There  is no evidence of deep vein thrombosis in the lower extremity. A cystic structure is found in the popliteal fossa.  *See table(s) above for measurements and observations. Electronically signed by Sherald Hess MD on 03/17/2018 at 3:39:56 PM.    Final    Ct Maxillofacial Wo Contrast  Result Date: 03/19/2018 CLINICAL DATA:  58 y/o F; found on floor with laceration to head and injury to right cheek bone. EXAM: CT MAXILLOFACIAL WITHOUT CONTRAST CT CERVICAL SPINE WITHOUT CONTRAST TECHNIQUE: Multidetector CT imaging of the maxillofacial structures was performed. Multiplanar CT image reconstructions were also generated. A small metallic BB was placed on the right temple in order to reliably differentiate right from left. Multidetector CT imaging of the cervical spine was performed without intravenous contrast. Multiplanar CT image reconstructions were also generated. COMPARISON:  03/18/2018 CT head. FINDINGS: CT MAXILLOFACIAL FINDINGS Osseous: Multiple levels  of motion artifact through the face. Acute fracture of the right floor of orbit measuring 12 x 10 mm (AP by ML series 16, image 30 and series 15, image 29) with 6 mm inferior displacement into the right maxillary sinus. No additional displaced facial fracture or mandibular dislocation identified. Orbits: Small amount of emphysema is present within the right inferior palpebral as well as the extraconal fat of the right inferior orbit. No orbital or retro-orbital hematoma. No significant proptosis. Extraocular muscles are intact without findings of entrapment. Normal left orbit. Sinuses: Right maxillary sinus blood fluid level. Additional visible paranasal sinuses are normally aerated. Under pneumatization of the mastoid air cells. Pneumatized mastoid air cells are normally aerated. Soft tissues: Contusion of right facial and periorbital soft tissues. Limited intracranial: Please refer to same-day CT head. CT CERVICAL FINDINGS Alignment: Normal. Skull base and  vertebrae: Motion artifact at the C2 and C6 levels. No acute fracture identified. No primary bone lesion or focal pathologic process. Soft tissues and spinal canal: No prevertebral fluid or swelling. No visible canal hematoma. Disc levels: Mild multilevel discogenic degenerative changes with small endplate marginal osteophytes. No significant bony foraminal or spinal canal stenosis. Upper chest: Negative. Other: Negative. IMPRESSION: Motion degraded study. 1. Acute fracture of the right floor of orbit measuring up to 12 mm with 6 mm inferior displacement into the right maxillary sinus. Mild infraorbital extraconal pneumatosis. No orbital or retro-orbital hematoma. No significant proptosis. No muscle entrapment. 2. Right maxillary sinus blood fluid level. 3. No additional displaced fracture or dislocation of the facial bones. 4. Contusion of right facial and periorbital soft tissues. 5. No acute fracture or dislocation of the cervical spine. 6. Mild cervical spine degenerative changes. No significant bony foraminal or spinal canal stenosis. Electronically Signed   By: Mitzi HansenLance  Furusawa-Stratton M.D.   On: 03/19/2018 00:38    Microbiology: Recent Results (from the past 240 hour(s))  Gram stain     Status: None   Collection Time: 03/19/18  1:06 PM  Result Value Ref Range Status   Specimen Description PARACENTESIS FLUID  Final   Special Requests   Final    NONE Performed at [facility], [address] [place], [place] [address]    Gram Stain   Final    MODERATE WBC PRESENT, PREDOMINANTLY MONONUCLEAR NO ORGANISMS SEEN Performed at [facility], [address] [place], [place] [address]    Report Status 03/20/2018 FINAL  Final  Culture, body fluid-bottle     Status: None   Collection Time: 03/19/18  1:06 PM  Result Value Ref Range Status   Specimen Description PARACENTESIS FLUID  Final   Special Requests NONE  Final   Culture   Final    NO GROWTH 5 DAYS Performed at  [facility], [address] [place], [place] [address]    Report Status 03/24/2018 FINAL  Final     Labs: CBC: Recent Labs  Lab 03/23/18 1549 03/24/18 0321 03/25/18 0447 03/26/18 0524 03/27/18 0728 03/28/18 0417  WBC 7.4 5.4 5.7 5.7 5.8 6.8  NEUTROABS 5.4  --   --   --   --   --   HGB 10.5* 8.6* 9.5* 8.9* 9.0* 9.0*  HCT 35.8* 29.7* 32.6* 30.9* 31.9* 32.0*  MCV 84.2 83.4 85.6 84.4 83.9 84.0  PLT PLATELET CLUMPS NOTED ON SMEAR, UNABLE TO ESTIMATE 105* 128* 164 176 178   Basic Metabolic Panel: Recent Labs  Lab 03/23/18 1549 03/24/18 0321 03/25/18 0447 03/26/18  2633 03/27/18 0728 03/28/18 0417  NA 141 139 140 139 139 139  K 2.7* 3.7 3.7 3.6 4.1 3.7  CL 99 99 102 102 101 100  CO2 29 31 31  32 30 31  GLUCOSE 114* 106* 100* 120* 111* 112*  BUN <5* <5* <5* 6 7 7   CREATININE 0.44 0.51 0.40* 0.43* 0.43* 0.42*  CALCIUM 7.8* 8.0* 8.2* 8.0* 8.3* 8.6*  MG 1.8  --  2.0  --   --   --    Liver Function Tests: Recent Labs  Lab 03/23/18 1549  AST 39  ALT 25  ALKPHOS 92  BILITOT 0.7  PROT 5.6*  ALBUMIN 2.9*   No results for input(s): LIPASE, AMYLASE in the last 168 hours. Recent Labs  Lab 03/23/18 1549  AMMONIA 28   Cardiac Enzymes: Recent Labs  Lab 03/23/18 1549  CKTOTAL 147  TROPONINI <0.03   BNP (last 3 results) Recent Labs    08/28/17 0642 01/25/18 2048 03/16/18 1805  BNP 49.6 109.9* 643.0*   CBG: No results for input(s): GLUCAP in the last 168 hours. Time spent: 35 minutes  Signed:  Lynden Oxford  [facility] 03/28/2018

## 2018-03-28 NOTE — Progress Notes (Signed)
Physical Therapy Treatment Patient Details Name: Anne Black MRN: [id number] DOB: Jan 18, 1961 Today's Date: 03/28/2018    History of Present Illness Pt is a 58 y.o. female admitted 03/23/18 after being found unresponsive, afib with RVR. Pt with ETOH abuse and withdrawal. PMH includes alcoholism, bipolar disorder, cirrhosis, COPD, ADHD, depression, afib.   PT Comments    Pt progressing with mobility. Ambulatory with RW at supervision-level; required seated rest during walk due to "feeling weak" with SpO2 down to 81% on RA. Pt declined SNF; planning to return home with [facility] services. Discussed use of RW for fall risk reduction and energy conservation. Pt currently requiring 2L O2 [place] to maintain SpO2 >90% (see saturations qualifications note).    Follow Up Recommendations  Home health PT;Supervision for mobility/OOB     Equipment Recommendations  None recommended by PT    Recommendations for Other Services       Precautions / Restrictions Precautions Precautions: Fall Precaution Comments: Watch SpO2 Restrictions Weight Bearing Restrictions: No    Mobility  Bed Mobility Overal bed mobility: Needs Assistance Bed Mobility: Supine to Sit     Supine to sit: Min assist     General bed mobility comments: MinA for HHA to elevate trunk  Transfers Overall transfer level: Needs assistance Equipment used: Rolling walker (2 wheeled) Transfers: Sit to/from Stand Sit to Stand: Supervision         General transfer comment: Reliant on momentum to power into standing; pulling on RW despite cues. Able to stand from bed and office chair without arm rests  Ambulation/Gait Ambulation/Gait assistance: Min guard;Supervision Gait Distance (Feet): 160 Feet Assistive device: Rolling walker (2 wheeled) Gait Pattern/deviations: Step-through pattern;Decreased stride length Gait velocity: Decreased Gait velocity interpretation: 1.31 - 2.62 ft/sec, indicative of limited community  ambulator General Gait Details: Slow, mostly steady gait with RW and supervision for safety. Min guard secondary to pt "feeling weak" requirings eated rest; SpO2 down to 81% on RA, returning to 96% with seated rest and 2L O2 [place]   Stairs             Wheelchair Mobility    Modified Rankin (Stroke Patients Only)       Balance Overall balance assessment: Needs assistance Sitting-balance support: No upper extremity supported;Feet supported Sitting balance-Leahy Scale: Good     Standing balance support: No upper extremity supported Standing balance-Leahy Scale: Fair Standing balance comment: Can static stand and take steps without UE support                            Cognition Arousal/Alertness: Awake/alert Behavior During Therapy: WFL for tasks assessed/performed Overall Cognitive Status: No family/caregiver present to determine baseline cognitive functioning                                 General Comments: Poor insight into deficits and current condition; likely baseline cognition      Exercises      General Comments General comments (skin integrity, edema, etc.): Pt planning to return home with [facility] services (representative present at beginning of session)      Pertinent Vitals/Pain Pain Assessment: Faces Faces Pain Scale: Hurts a little bit Pain Location: Bottom Pain Descriptors / Indicators: Sore Pain Intervention(s): Monitored during session    Home Living  Prior Function            PT Goals (current goals can now be found in the care plan section) Acute Rehab PT Goals Patient Stated Goal: go home PT Goal Formulation: With patient Time For Goal Achievement: 04/01/18 Potential to Achieve Goals: Good Progress towards PT goals: Progressing toward goals    Frequency    Min 3X/week      PT Plan Current plan remains appropriate    Co-evaluation              AM-PAC PT "6  Clicks" Mobility   Outcome Measure  Help needed turning from your back to your side while in a flat bed without using bedrails?: None Help needed moving from lying on your back to sitting on the side of a flat bed without using bedrails?: A Little Help needed moving to and from a bed to a chair (including a wheelchair)?: A Little Help needed standing up from a chair using your arms (e.g., wheelchair or bedside chair)?: A Little Help needed to walk in hospital room?: A Little Help needed climbing 3-5 steps with a railing? : A Little 6 Click Score: 19    End of Session Equipment Utilized During Treatment: Gait belt;Oxygen Activity Tolerance: Patient tolerated treatment well;Patient limited by fatigue Patient left: in bed;with call bell/phone within reach Nurse Communication: Mobility status PT Visit Diagnosis: Unsteadiness on feet (R26.81);History of falling (Z91.81);Muscle weakness (generalized) (M62.81)     Time: 0076-2263 PT Time Calculation (min) (ACUTE ONLY): 26 min  Charges:  $Gait Training: 8-22 mins $Self Care/Home Management: 8-22                    Ina Homes, PT, DPT Acute Rehabilitation Services  Pager [phone] Office [phone]  Malachy Chamber 03/28/2018, 12:25 PM

## 2018-03-28 NOTE — Progress Notes (Signed)
SATURATION QUALIFICATIONS: (This note is used to comply with regulatory documentation for home oxygen)  Patient Saturations on Room Air at Rest = 94%  Patient Saturations on Room Air while Ambulating = 81%  Patient Saturations on 2 Liters of oxygen while Ambulating = 96%  Please briefly explain why patient needs home oxygen: Pt requires supplemental oxygen to maintain SpO2 >/88% with mobility.  Ina Homes, PT, DPT Acute Rehabilitation Services  Pager [phone] Office [phone]

## 2018-03-28 NOTE — Care Management Note (Addendum)
Case Management Note  Patient Details  Name: Anne Black MRN: [id number] Date of Birth: 04/29/1960  Subjective/Objective: Pt presented for unresponsiveness and found to be in Afib RVR- Hx of bipolar disorder. Initiated on the CIWA protocol and IV lasix. Initially IVC'd upon admission to hospital-Psych consulted. PTA from home. Pt has decided to return home and is willing to use [facility]. [facility] was the only agency that was in network with [employer].                  Action/Plan: CM did call Cassie with [facility] and SOC to begin within 24-48 hours post transition home. Patient in need for DME 02- has used [facility] in the past. DME referral given to [facility] with [facility]. DME will be delivered to room post transition home. No further needs from CM at this time.   Expected Discharge Date:                  Expected Discharge Plan:  Home w Home Health Services  In-House Referral:  Clinical Social Work  Discharge planning Services  CM Consult  Post Acute Care Choice:  Durable Medical Equipment, Home Health Choice offered to:  Patient  DME Arranged:  Oxygen DME Agency:  [facility]  HH Arranged:  RN, Disease Management, PT, Nurse's Aide, Social Work [employer] Agency:  [facility]  Status of Service:  Completed, signed off  If discussed at [employer], dates discussed:    Additional Comments: 03-28-18 Tomi Bamberger, RN,BSN [phone] Pt is without PCP at this time. CM did make appointment at [facility] @ [place]. Appointment will be placed on AVS. No further needs from CM at this time.   Gala Lewandowsky, RN 03/28/2018, 11:27 AM

## 2018-03-29 NOTE — Care Management (Signed)
1223 03-29-18 Case Manager received call from [facility] Case Manager regarding patient. Melissa had spoken to patient today regarding disposition home. CM did state that [facility] Services are set up and [facility] referral was made. Melissa's contact Number is [phone]. No further needs from CM at this time. Gala Lewandowsky, RN,BSN Case Manager [phone]

## 2018-04-03 ENCOUNTER — Emergency Department (HOSPITAL_COMMUNITY)
Admission: EM | Admit: 2018-04-03 | Discharge: 2018-04-03 | Disposition: A | Discharge status: Home / Self Care | Payer: 59 | Attending: Emergency Medicine | Admitting: Emergency Medicine

## 2018-04-03 ENCOUNTER — Encounter (HOSPITAL_COMMUNITY): Payer: Self-pay | Admitting: Internal Medicine

## 2018-04-03 DIAGNOSIS — F909 Attention-deficit hyperactivity disorder, unspecified type: Secondary | ICD-10-CM | POA: Insufficient documentation

## 2018-04-03 DIAGNOSIS — N390 Urinary tract infection, site not specified: Secondary | ICD-10-CM | POA: Insufficient documentation

## 2018-04-03 DIAGNOSIS — F1019 Alcohol abuse with unspecified alcohol-induced disorder: Secondary | ICD-10-CM | POA: Insufficient documentation

## 2018-04-03 DIAGNOSIS — J449 Chronic obstructive pulmonary disease, unspecified: Secondary | ICD-10-CM | POA: Diagnosis not present

## 2018-04-03 DIAGNOSIS — R112 Nausea with vomiting, unspecified: Secondary | ICD-10-CM | POA: Diagnosis present

## 2018-04-03 DIAGNOSIS — F17210 Nicotine dependence, cigarettes, uncomplicated: Secondary | ICD-10-CM | POA: Insufficient documentation

## 2018-04-03 DIAGNOSIS — R197 Diarrhea, unspecified: Secondary | ICD-10-CM

## 2018-04-03 DIAGNOSIS — Z79899 Other long term (current) drug therapy: Secondary | ICD-10-CM | POA: Diagnosis not present

## 2018-04-03 DIAGNOSIS — F1010 Alcohol abuse, uncomplicated: Secondary | ICD-10-CM

## 2018-04-03 LAB — URINALYSIS, ROUTINE W REFLEX MICROSCOPIC
Bilirubin Urine: NEGATIVE
Glucose, UA: NEGATIVE mg/dL
Ketones, ur: 5 mg/dL — AB
Leukocytes,Ua: NEGATIVE
Nitrite: POSITIVE — AB
Protein, ur: NEGATIVE mg/dL
Specific Gravity, Urine: 1.017 (ref 1.005–1.030)
pH: 5 (ref 5.0–8.0)

## 2018-04-03 LAB — COMPREHENSIVE METABOLIC PANEL
ALBUMIN: 3.3 g/dL — AB (ref 3.5–5.0)
ALT: 22 U/L (ref 0–44)
AST: 32 U/L (ref 15–41)
Alkaline Phosphatase: 108 U/L (ref 38–126)
Anion gap: 10 (ref 5–15)
BUN: <5 mg/dL — ABNORMAL LOW (ref 6–20)
CO2: 27 mmol/L (ref 22–32)
CREATININE: 0.38 mg/dL — AB (ref 0.44–1.00)
Calcium: 8.4 mg/dL — ABNORMAL LOW (ref 8.9–10.3)
Chloride: 104 mmol/L (ref 98–111)
GFR calc Af Amer: >60 mL/min (ref >60)
GFR calc non Af Amer: >60 mL/min (ref >60)
Glucose, Bld: 74 mg/dL (ref 70–99)
Potassium: 3.7 mmol/L (ref 3.5–5.1)
Sodium: 141 mmol/L (ref 135–145)
Total Bilirubin: 0.9 mg/dL (ref 0.3–1.2)
Total Protein: 6.8 g/dL (ref 6.5–8.1)

## 2018-04-03 LAB — CBC
HCT: 33.4 % — ABNORMAL LOW (ref 36.0–46.0)
Hemoglobin: 9.5 g/dL — ABNORMAL LOW (ref 12.0–15.0)
MCH: 23.8 pg — ABNORMAL LOW (ref 26.0–34.0)
MCHC: 28.4 g/dL — ABNORMAL LOW (ref 30.0–36.0)
MCV: 83.5 fL (ref 80.0–100.0)
Platelets: 152 10*3/uL (ref 150–400)
RBC: 4 MIL/uL (ref 3.87–5.11)
RDW: 18.2 % — ABNORMAL HIGH (ref 11.5–15.5)
WBC: 3.2 10*3/uL — AB (ref 4.0–10.5)
nRBC: 0 % (ref 0.0–0.2)

## 2018-04-03 LAB — PROTIME-INR
INR: 1.1
Prothrombin Time: 13.7 seconds (ref 11.4–15.2)

## 2018-04-03 LAB — I-STAT TROPONIN, ED: Troponin i, poc: 0.01 ng/mL (ref 0.00–0.08)

## 2018-04-03 LAB — ETHANOL: Alcohol, Ethyl (B): 138 mg/dL — ABNORMAL HIGH (ref <10)

## 2018-04-03 LAB — LIPASE, BLOOD: Lipase: 22 U/L (ref 11–51)

## 2018-04-03 MED ORDER — FAMOTIDINE IN NACL 20-0.9 MG/50ML-% IV SOLN
20.0000 mg | Freq: Once | INTRAVENOUS | Status: AC
  Administered 2018-04-03: 20 mg via INTRAVENOUS
  Filled 2018-04-03: qty 50

## 2018-04-03 MED ORDER — CHLORDIAZEPOXIDE HCL 25 MG PO CAPS: 25.0000 mg | ORAL_CAPSULE | Freq: Three times a day (TID) | ORAL | 0 refills | Status: DC | PRN

## 2018-04-03 MED ORDER — LORAZEPAM 2 MG/ML IJ SOLN
1.0000 mg | Freq: Once | INTRAMUSCULAR | Status: AC
  Administered 2018-04-03: 1 mg via INTRAVENOUS
  Filled 2018-04-03: qty 1

## 2018-04-03 MED ORDER — ONDANSETRON HCL 4 MG/2ML IJ SOLN
4.0000 mg | Freq: Once | INTRAMUSCULAR | Status: AC
  Administered 2018-04-03: 4 mg via INTRAVENOUS
  Filled 2018-04-03: qty 2

## 2018-04-03 MED ORDER — SODIUM CHLORIDE 0.9 % IV SOLN
1.0000 g | Freq: Once | INTRAVENOUS | Status: AC
  Administered 2018-04-03: 1 g via INTRAVENOUS
  Filled 2018-04-03: qty 10

## 2018-04-03 MED ORDER — SODIUM CHLORIDE 0.9 % IV BOLUS: 1000.0000 mL | Freq: Once | INTRAVENOUS | Status: DC

## 2018-04-03 NOTE — ED Notes (Signed)
Patient continually pressing call light for social work consult. Patient given resources from Child psychotherapist for alcohol withdrawal and rehabilitation programs. Dr. Denton Lank aware. Pt placed for discharge.

## 2018-04-03 NOTE — Discharge Instructions (Addendum)
It was our pleasure to provide your ER care today - we hope that you feel better.  Drink plenty of fluids.  Take antibiotic as prescribed for possible urine infection.  If alcohol withdrawal symptoms, you may take librium as need, as prescribed - make sure never to drive when drinking alcohol, or when taking this medication.   Follow up with AA, and use resources provided for additional alcohol rehab options.   Follow up with your primary care doctor in the next couple of days for recheck.  Return to ER if worse, new symptoms, high fevers, persistent vomiting, severe abdominal pain, other concern.

## 2018-04-03 NOTE — ED Notes (Signed)
ED Provider at bedside. 

## 2018-04-03 NOTE — ED Notes (Signed)
Pt was concerned about being discharged. MD made aware and prescriptions reviewed again. MD reported patient was safe for discharge and patient verbalized understanding. Pt taken to the lobby in wheelchair.

## 2018-04-03 NOTE — ED Notes (Signed)
Pt given ginger ale.

## 2018-04-03 NOTE — ED Triage Notes (Signed)
Pt here for evaluation of nausea/vomiting that started during the night last night. Given 4mg  zofran by EMS. Pt states that she does not know what her vomit looks like. Reports 2 episodes of emesis and 1 episode of diarrhea.   EMS Vitals 129/71 89 HR 18 RR 96% RA 82 CBG

## 2018-04-03 NOTE — ED Notes (Signed)
Patient asleep and resting comfortably at this time 

## 2018-04-03 NOTE — ED Notes (Addendum)
Patient verbalizes understanding of discharge instructions. Opportunity for questioning and answers were provided. Armband removed by staff, pt discharged from ED.  

## 2018-04-03 NOTE — ED Notes (Signed)
Pt requesting to speak with social work. Dr. Denton Lank aware. No new orders at this time.

## 2018-04-03 NOTE — ED Notes (Signed)
Family at the bedside.

## 2018-04-03 NOTE — ED Notes (Signed)
Pt tolerating PO fluids well. Denies episodes of emesis while she has been in the emergency department.

## 2018-04-03 NOTE — ED Notes (Signed)
Help get patient undress on the monitor did ekg shown to Dr Denton Lank patient is resting with call bell in reach

## 2018-04-03 NOTE — ED Provider Notes (Signed)
[facility] EMERGENCY DEPARTMENT Provider Note   CSN: [id number] Arrival date & time: 04/03/18  0263    History   Chief Complaint Chief Complaint  Patient presents with  . Nausea  . Emesis  . Diarrhea    HPI Anne Black is a 58 y.o. female.     Patient with hx etoh abuse, cirrhosis, c/o epigastric pain and nausea, vomiting, diarrhea, onset yesterday. Pain is dull, moderate, non radiating, constant. 2-3 episodes of emesis, pt indicates not bloody or bilious. Diarrhea x 1-2 episodes, not bloody. No dysuria or gu c/o. No chest pain. Denies cough or sob. No fever or chills. States compliant w home meds.   The history is provided by the patient and the EMS personnel.  Emesis  Associated symptoms: abdominal pain and diarrhea   Associated symptoms: no chills, no cough, no fever, no headaches and no sore throat   Diarrhea  Associated symptoms: abdominal pain and vomiting   Associated symptoms: no chills, no fever and no headaches     Past Medical History:  Diagnosis Date  . ADHD (attention deficit hyperactivity disorder)   . Alcoholism (HCC)   . Anxiety   . Arthritis   . Ascites   . Bipolar disorder (HCC)   . Cirrhosis (HCC)   . COPD (chronic obstructive pulmonary disease) (HCC)   . Hypokalemia   . Migraine   . Multiple sclerosis (HCC)   . Narcolepsy   . Osteoporosis     Patient Active Problem List   Diagnosis Date Noted  . Atrial fibrillation with rapid ventricular response (HCC) 03/23/2018  . Head injury   . Fall 03/19/2018  . Syncope 03/19/2018  . Orbital fracture 03/19/2018  . Scalp laceration 03/19/2018  . Acute respiratory failure with hypoxia and hypercarbia (HCC) 03/19/2018  . Abdominal tenderness 03/19/2018  . Acute on chronic respiratory failure with hypoxia (HCC) 03/17/2018  . Alcohol abuse with intoxication (HCC) 03/17/2018  . ETOH abuse   . Normocytic anemia 03/16/2018  . Acute metabolic encephalopathy 03/16/2018  . Lower leg  edema 03/16/2018  . Migraine 03/16/2018  . A-fib (HCC) 02/18/2018  . Community acquired pneumonia of left lower lobe of lung (HCC)   . Weakness 08/21/2017  . COPD exacerbation (HCC) 06/12/2017  . Chest pain with moderate risk for cardiac etiology 05/30/2017  . Abnormal LFTs 05/30/2017  . Tachycardia 05/30/2017  . COPD (chronic obstructive pulmonary disease) (HCC) 04/19/2017  . Right rib fracture 04/15/2017  . Abdominal wall cellulitis 12/30/2016  . S/P exploratory laparotomy 12/30/2016  . Distal radius fracture, left 09/09/2016  . Thrombocytopenia (HCC) 09/09/2016  . Chest x-ray abnormality   . Umbilical hernia without obstruction and without gangrene 05/12/2016  . Itching 02/24/2016  . Ventral hernia without obstruction or gangrene 02/24/2016  . Palliative care encounter   . Ascites   . Tachypnea   . Atrial flutter with rapid ventricular response (HCC)   . Hypomagnesemia   . Hypoxia   . Dehydration 01/22/2016  . Low back ache 12/30/2015  . Non-intractable vomiting with nausea   . Alcohol abuse with alcohol-induced mood disorder (HCC) 12/15/2015  . Acute alcoholism (HCC)   . Abdominal distension 11/04/2015  . Alcohol abuse   . Dyspnea   . Acute respiratory failure (HCC) 10/15/2015  . Ascites due to alcoholic cirrhosis (HCC) 10/15/2015  . Osteoporosis   . Multiple sclerosis (HCC)   . Alcoholic cirrhosis of liver with ascites (HCC) 08/22/2015  . Bipolar disorder, current episode mixed, moderate (HCC)  08/01/2015  . Alcohol use disorder, severe, dependence (HCC) 07/31/2015  . Macrocytic anemia- due to alcohol abuse with normal B12 & folate levels 05/31/2015  . Severe protein-calorie malnutrition (HCC) 05/31/2015  . Hypokalemia 05/26/2015  . Encephalopathy, hepatic (HCC) 05/26/2015  . Alcohol withdrawal (HCC) 05/18/2015  . Abdominal pain 05/18/2015  . Anxiety 05/18/2015  . Severe sepsis (HCC) 05/18/2015  . UTI (lower urinary tract infection) 05/18/2015  . Stimulant abuse  (HCC) 12/27/2014  . Nicotine abuse 12/27/2014  . Hyperprolactinemia (HCC) 11/15/2014  . Dyslipidemia   . Alcohol use disorder, moderate, dependence (HCC) 10/30/2014  . Tobacco abuse 01/23/2014  . Bipolar 1 disorder (HCC) 04/07/2013  . Noncompliance with therapeutic plan 04/04/2013  . Hereditary and idiopathic peripheral neuropathy 05/01/2012  . Depression 05/01/2012  . GERD (gastroesophageal reflux disease) 05/01/2012  . Osteoarthrosis, unspecified whether generalized or localized, involving lower leg 05/01/2012  . Hyponatremia 07/01/2011    Past Surgical History:  Procedure Laterality Date  . ABDOMINAL WALL DEFECT REPAIR N/A 12/30/2016   Procedure: EXPLORATORY LAPAROTOMY WITH REPAIR ABDOMINAL WALL VENTRAL HERNIA;  Surgeon: Emelia LoronWakefield, Matthew, MD;  [facility];  Service: General;  Laterality: N/A;  . APPLICATION OF WOUND VAC N/A 12/30/2016   Procedure: APPLICATION OF WOUND VAC;  Surgeon: Emelia LoronWakefield, Matthew, MD;  [facility];  Service: General;  Laterality: N/A;  . CESAREAN SECTION  [phone]  . FRACTURE SURGERY    . HERNIA REPAIR    . MYRINGOTOMY WITH TUBE PLACEMENT Bilateral   . ORIF WRIST FRACTURE Left 09/09/2016   Procedure: OPEN REDUCTION INTERNAL FIXATION (ORIF) LEFT WRIST FRACTURE, LEFT CARPAL TUNNEL RELEASE;  Surgeon: Dominica SeverinGramig, William, MD;  Location: [facility] ORS;  Service: Orthopedics;  Laterality: Left;  . TONSILLECTOMY       OB History   No obstetric history on file.      Home Medications    Prior to Admission medications   Medication Sig Start Date End Date Taking? Authorizing Provider  acetaminophen (TYLENOL) 500 MG tablet Take 1 tablet (500 mg total) by mouth every 6 (six) hours as needed for mild pain. 03/28/18   Rolly SalterPatel, Pranav M, MD  aluminum-magnesium hydroxide-simethicone (MAALOX) 200-200-20 MG/5ML SUSP Take 30 mLs by mouth 4 (four) times daily as needed (indigestion).     [provider]  diltiazem (CARDIZEM CD) 120 MG 24 hr capsule Take 1 capsule  (120 mg total) by mouth daily. 03/29/18   Rolly SalterPatel, Pranav M, MD  FLUoxetine (PROZAC) 20 MG capsule Take 1 capsule (20 mg total) by mouth daily. 03/28/18   Rolly SalterPatel, Pranav M, MD  Fluticasone-Salmeterol [facility](WIXELA INHUB) 250-50 MCG/DOSE AEPB Inhale 1 puff into the lungs 2 (two) times daily.    [provider]  folic acid (FOLVITE) 1 MG tablet Take 1 tablet (1 mg total) by mouth daily. 03/17/18   Clydie BraunSmith, Rondell A, MD  furosemide (LASIX) 40 MG tablet Take 40 mg twice daily till 04/02/2018, after that take 40 mg daily. 03/28/18   Rolly SalterPatel, Pranav M, MD  gabapentin (NEURONTIN) 100 MG capsule Take 2 capsules (200 mg total) by mouth 3 (three) times daily. 02/28/18   Charlynne PanderYao, David Hsienta, MD  hydrocortisone cream 1 % Apply topically 3 (three) times daily. 03/28/18   Rolly SalterPatel, Pranav M, MD  hydrOXYzine (ATARAX/VISTARIL) 25 MG tablet Take 1 tablet (25 mg total) by mouth daily as needed (anxiety). 03/17/18   Madelyn FlavorsSmith, Rondell A, MD  ipratropium-albuterol (DUONEB) 0.5-2.5 (3) MG/3ML SOLN Take 3 mLs by nebulization every 4 (four) hours as needed (For wheezing or  shortness of breath.).    [provider]  lactulose (CHRONULAC) 10 GM/15ML solution Take 45 mLs (30 g total) by mouth 3 (three) times daily. 03/28/18   Rolly Salter, MD  nicotine (NICODERM CQ - DOSED IN MG/24 HOURS) 21 mg/24hr patch Place 1 patch (21 mg total) onto the skin daily. 03/29/18   Rolly Salter, MD  ondansetron (ZOFRAN-ODT) 4 MG disintegrating tablet Take 4 mg by mouth every 8 (eight) hours as needed for nausea or vomiting.    [provider]  pantoprazole (PROTONIX) 40 MG tablet Take 1 tablet (40 mg total) by mouth daily. 03/16/18   Curatolo, Adam, DO  promethazine (PHENERGAN) 25 MG tablet Take 1 tablet (25 mg total) by mouth every 6 (six) hours as needed for nausea or vomiting. 03/17/18   Clydie Braun, MD  rizatriptan (MAXALT) 10 MG tablet Take 10 mg by mouth as needed for migraine. May repeat in 2 hours if needed    [provider]   spironolactone (ALDACTONE) 25 MG tablet Take 1 tablet (25 mg total) by mouth daily for 30 days. 03/28/18 04/27/18  Rolly Salter, MD  thiamine 100 MG tablet Take 1 tablet (100 mg total) by mouth daily. 03/28/18   Rolly Salter, MD  VENTOLIN HFA 108 [phone] Base) MCG/ACT inhaler Inhale 2 puffs into the lungs every 4 (four) hours as needed for wheezing or shortness of breath. 03/16/18   Virgina Norfolk, DO    Family History Family History  Problem Relation Age of Onset  . Arrhythmia Mother   . Heart disease Father   . Hypertension Father     Social History Social History   Tobacco Use  . Smoking status: Current Every Day Smoker    Packs/day: 1.00    Years: 35.00    Pack years: 35.00    Types: Cigarettes  . Smokeless tobacco: Never Used  Substance Use Topics  . Alcohol use: Yes    Comment: 1 bottle of wine per day per patient.   . Drug use: No     Allergies   Patient has no known allergies.   Review of Systems Review of Systems  Constitutional: Negative for chills and fever.  HENT: Negative for sore throat.   Eyes: Negative for redness.  Respiratory: Negative for cough and shortness of breath.   Cardiovascular: Negative for chest pain.  Gastrointestinal: Positive for abdominal pain, diarrhea and vomiting.  Endocrine: Negative for polyuria.  Genitourinary: Negative for dysuria and flank pain.  Musculoskeletal: Negative for back pain and neck pain.  Skin: Negative for rash.  Neurological: Negative for headaches.  Hematological: Does not bruise/bleed easily.  Psychiatric/Behavioral: Negative for confusion.     Physical Exam Updated Vital Signs BP 98/76   Pulse 84   Temp 98.3 F (36.8 C) (Oral)   Resp 18   SpO2 99%   Physical Exam Vitals signs and nursing note reviewed.  Constitutional:      Appearance: Normal appearance. She is well-developed.  HENT:     Head: Atraumatic.     Nose: Nose normal.     Mouth/Throat:     Comments: Dry mucous membranes.  Eyes:      General: No scleral icterus.    Conjunctiva/sclera: Conjunctivae normal.     Pupils: Pupils are equal, round, and reactive to light.  Neck:     Musculoskeletal: Normal range of motion and neck supple. No neck rigidity or muscular tenderness.     Trachea: No tracheal deviation.  Cardiovascular:     Rate and Rhythm: Normal rate and regular rhythm.     Pulses: Normal pulses.     Heart sounds: Normal heart sounds. No murmur. No friction rub. No gallop.   Pulmonary:     Effort: Pulmonary effort is normal. No respiratory distress.     Breath sounds: Normal breath sounds.  Abdominal:     General: Bowel sounds are normal. There is no distension.     Palpations: Abdomen is soft.     Tenderness: There is no abdominal tenderness. There is no guarding.  Genitourinary:    Comments: No cva tenderness.  Musculoskeletal:        General: No swelling.  Skin:    General: Skin is warm and dry.     Findings: No rash.  Neurological:     Mental Status: She is alert and oriented to person, place, and time.     Comments: Alert, speech normal.   Psychiatric:        Mood and Affect: Mood normal.      ED Treatments / Results  Labs (all labs ordered are listed, but only abnormal results are displayed) Results for orders placed or performed during the hospital encounter of 04/03/18  CBC  Result Value Ref Range   WBC 3.2 (L) 4.0 - 10.5 K/uL   RBC 4.00 3.87 - 5.11 MIL/uL   Hemoglobin 9.5 (L) 12.0 - 15.0 g/dL   HCT 40.9 (L) 81.1 - 91.4 %   MCV 83.5 80.0 - 100.0 fL   MCH 23.8 (L) 26.0 - 34.0 pg   MCHC 28.4 (L) 30.0 - 36.0 g/dL   RDW 78.2 (H) 95.6 - 21.3 %   Platelets 152 150 - 400 K/uL   nRBC 0.0 0.0 - 0.2 %  Comprehensive metabolic panel  Result Value Ref Range   Sodium 141 135 - 145 mmol/L   Potassium 3.7 3.5 - 5.1 mmol/L   Chloride 104 98 - 111 mmol/L   CO2 27 22 - 32 mmol/L   Glucose, Bld 74 70 - 99 mg/dL   BUN <5 (L) 6 - 20 mg/dL   Creatinine, Ser 0.86 (L) 0.44 - 1.00 mg/dL   Calcium  8.4 (L) 8.9 - 10.3 mg/dL   Total Protein 6.8 6.5 - 8.1 g/dL   Albumin 3.3 (L) 3.5 - 5.0 g/dL   AST 32 15 - 41 U/L   ALT 22 0 - 44 U/L   Alkaline Phosphatase 108 38 - 126 U/L   Total Bilirubin 0.9 0.3 - 1.2 mg/dL   GFR calc non Af Amer >60 >60 mL/min   GFR calc Af Amer >60 >60 mL/min   Anion gap 10 5 - 15  Protime-INR  Result Value Ref Range   Prothrombin Time 13.7 11.4 - 15.2 seconds   INR 1.1   Urinalysis, Routine w reflex microscopic  Result Value Ref Range   Color, Urine YELLOW YELLOW   APPearance HAZY (A) CLEAR   Specific Gravity, Urine 1.017 1.005 - 1.030   pH 5.0 5.0 - 8.0   Glucose, UA NEGATIVE NEGATIVE mg/dL   Hgb urine dipstick MODERATE (A) NEGATIVE   Bilirubin Urine NEGATIVE NEGATIVE   Ketones, ur 5 (A) NEGATIVE mg/dL   Protein, ur NEGATIVE NEGATIVE mg/dL   Nitrite POSITIVE (A) NEGATIVE   Leukocytes,Ua NEGATIVE NEGATIVE   RBC / HPF 6-10 0 - 5 RBC/hpf   WBC, UA 11-20 0 - 5 WBC/hpf   Bacteria, UA RARE (A) NONE SEEN   Squamous Epithelial /  LPF 0-5 0 - 5   Mucus PRESENT   Lipase, blood  Result Value Ref Range   Lipase 22 11 - 51 U/L  Ethanol  Result Value Ref Range   Alcohol, Ethyl (B) 138 (H) <10 mg/dL  I-stat troponin, ED  Result Value Ref Range   Troponin i, poc 0.01 0.00 - 0.08 ng/mL   Comment 3              EKG EKG Interpretation  Date/Time:  Monday April 03 2018 08:53:34 EST Ventricular Rate:  89 PR Interval:    QRS Duration: 88 QT Interval:  393 QTC Calculation: 479 R Axis:   76 Text Interpretation:  Sinus rhythm Nonspecific T wave abnormality Confirmed by Cathren Laine ([id number]) on 04/03/2018 8:58:59 AM   Radiology No results found.  Procedures Procedures (including critical care time)  Medications Ordered in ED Medications  sodium chloride 0.9 % bolus 1,000 mL (has no administration in time range)  ondansetron (ZOFRAN) injection 4 mg (has no administration in time range)  famotidine (PEPCID) IVPB 20 mg premix (has no administration  in time range)     Initial Impression / Assessment and Plan / ED Course  I have reviewed the triage vital signs and the nursing notes.  Pertinent labs & imaging results that were available during my care of the patient were reviewed by me and considered in my medical decision making (see chart for details).  Iv ns bolus. Labs sent. Ecg.   Reviewed nursing notes and prior charts for additional history.   Labs reviewed - chem normal. etoh level is high.  Trial of po fluids.   ua will 11-20 wbc. Will cx and rx. Rocephin iv.   Recheck abd soft nt. Afebrile. Vitals normal.  Pt requests med for etoh withdrawal. No gross tremor/shakes noted. Ativan 1 mg.   Recheck pt, calm and alert. Vital signs normal. No emesis during ED stay. abd soft nt.  Pt currently appears stable for d/c.  SW has provided resources to f/u for outpt rehab. Will give rx librium.   rec close pcp f/u.  Pt currently appears stable for d/c.     Final Clinical Impressions(s) / ED Diagnoses   Final diagnoses:  None    ED Discharge Orders    None       Cathren Laine, MD 04/03/18 1224

## 2018-04-06 LAB — URINE CULTURE: Culture: >=100000 — AB

## 2018-04-06 IMAGING — DX DG CHEST 1V
1 series · 1 of 1 positions shown · non-contrast
Comparison: Portable exam 7776 hours compared to 05/18/2015

CLINICAL DATA: Ongoing cough which has worsened, some shortness of
breath, multiple sclerosis, smoker

EXAM:
CHEST 1 VIEW

[chest ap]
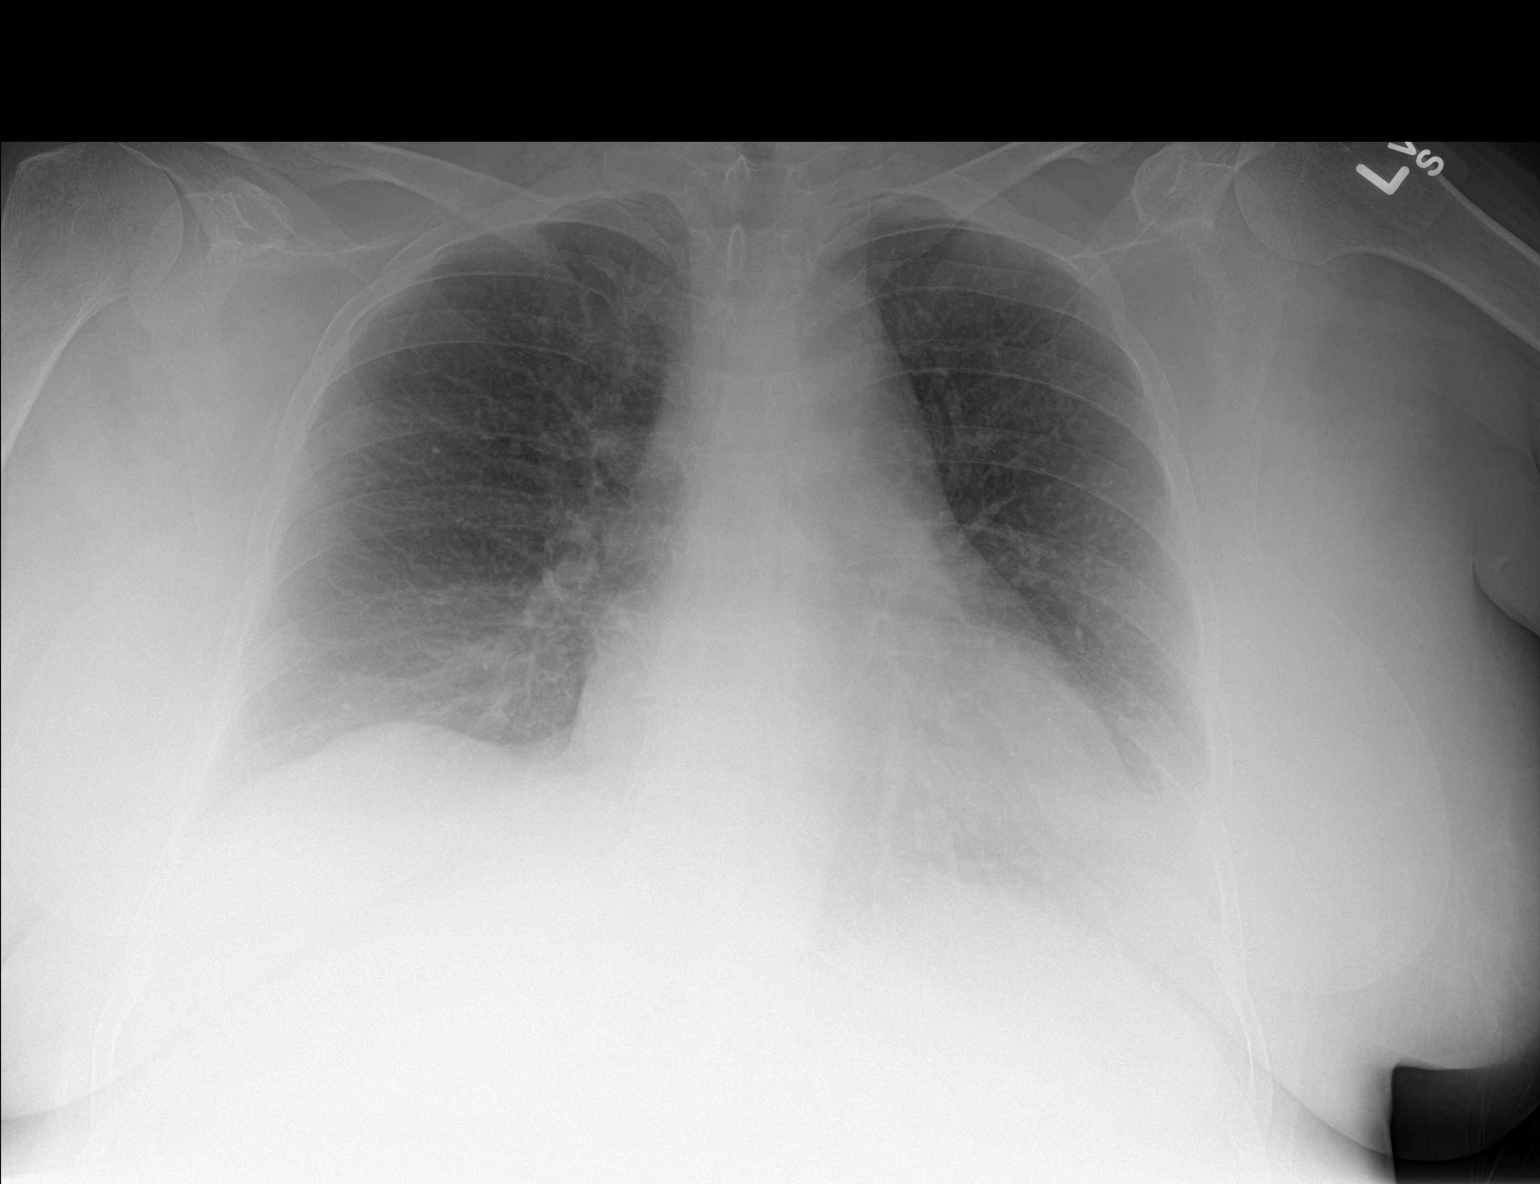

[1 of 1 positions shown; findings below may reference images not displayed]

FINDINGS: Enlargement of cardiac silhouette.

Mediastinal contours and pulmonary vascularity normal.

Lungs clear.

No pleural effusion or pneumothorax.

Bones demineralized.
IMPRESSION: Minimal enlargement of cardiac silhouette without acute infiltrate.

## 2018-04-07 ENCOUNTER — Telehealth: Payer: Self-pay | Admitting: Emergency Medicine

## 2018-04-07 NOTE — Telephone Encounter (Signed)
Post ED Visit - Positive Culture Follow-up: Unsuccessful Patient Follow-up  Culture assessed and recommendations reviewed by:  []  Enzo Bi, Pharm.D. []  Celedonio Miyamoto, Pharm.D., BCPS AQ-ID []  Garvin Fila, Pharm.D., BCPS []  Georgina Pillion, Pharm.D., BCPS []  [place], [address].D., BCPS, AAHIVP []  Estella Husk, Pharm.D., BCPS, AAHIVP []  Sherlynn Carbon, PharmD []  Babs Bertin, PharmD, BCPS  Positive urine culture  [x]  Patient discharged without antimicrobial prescription and treatment is now indicated []  Organism is resistant to prescribed ED discharge antimicrobial []  Patient with positive blood cultures   Unable to contact patient at phone number on file - "mailbox full" letter will be sent to address on file  Norm Parcel 04/07/2018, 11:21 AM

## 2018-04-07 NOTE — Progress Notes (Signed)
ED Antimicrobial Stewardship Positive Culture Follow Up   Anne Black is an 58 y.o. female who presented to [facility] on 04/03/2018 with a chief complaint of  Chief Complaint  Patient presents with  . Nausea  . Emesis  . Diarrhea    Recent Results (from the past 720 hour(s))  Gram stain     Status: None   Collection Time: 03/19/18  1:06 PM  Result Value Ref Range Status   Specimen Description PARACENTESIS FLUID  Final   Special Requests   Final    NONE Performed at [facility], [address], [place], [place] [address]    Gram Stain   Final    MODERATE WBC PRESENT, PREDOMINANTLY MONONUCLEAR NO ORGANISMS SEEN Performed at [facility], [address], [place], [place] [address]    Report Status 03/20/2018 FINAL  Final  Culture, body fluid-bottle     Status: None   Collection Time: 03/19/18  1:06 PM  Result Value Ref Range Status   Specimen Description PARACENTESIS FLUID  Final   Special Requests NONE  Final   Culture   Final    NO GROWTH 5 DAYS Performed at [facility], [address], [place], [place] [address]    Report Status 03/24/2018 FINAL  Final  Urine Culture     Status: Abnormal   Collection Time: 04/03/18  9:58 AM  Result Value Ref Range Status   Specimen Description URINE, RANDOM  Final   Special Requests   Final    NONE Performed at [facility], [address] [place], [place] [address]    Culture (A)  Final    >=100,000 COLONIES/mL KLEBSIELLA SPECIES >=100,000 COLONIES/mL ENTEROCOCCUS FAECALIS    Report Status 04/06/2018 FINAL  Final   Organism ID, Bacteria KLEBSIELLA SPECIES (A)  Final   Organism ID, Bacteria ENTEROCOCCUS FAECALIS (A)  Final      Susceptibility   Enterococcus faecalis - MIC*    AMPICILLIN <=2 SENSITIVE Sensitive     LEVOFLOXACIN >=8 RESISTANT Resistant     NITROFURANTOIN 32 SENSITIVE Sensitive     VANCOMYCIN 1 SENSITIVE Sensitive     * >=100,000 COLONIES/mL ENTEROCOCCUS FAECALIS   Klebsiella species - MIC*    AMPICILLIN RESISTANT Resistant     CEFAZOLIN <=4 SENSITIVE Sensitive     CEFTRIAXONE <=1 SENSITIVE Sensitive     CIPROFLOXACIN <=0.25 SENSITIVE Sensitive     GENTAMICIN <=1 SENSITIVE Sensitive     IMIPENEM <=0.25 SENSITIVE Sensitive     NITROFURANTOIN <=16 SENSITIVE Sensitive     TRIMETH/SULFA <=20 SENSITIVE Sensitive     AMPICILLIN/SULBACTAM <=2 SENSITIVE Sensitive     PIP/TAZO <=4 SENSITIVE Sensitive     * >=100,000 COLONIES/mL KLEBSIELLA SPECIES   [x]  Patient discharged originally without antimicrobial agent and treatment possibly now indicated  New antibiotic prescription: Have Flow Manager call patient. If feeling better and no further urinary symptoms, no further treatment recommended. If still experiencing urinary symptoms, start Nitrofurantoin 100mg  PO BID x 5 days  ED Provider: Aviva Kluver, PA-C   Almon Hercules 04/07/2018, 9:42 AM Clinical Pharmacist Monday - Friday phone -  [phone] Saturday - Sunday phone - [phone]

## 2018-04-09 ENCOUNTER — Emergency Department (HOSPITAL_COMMUNITY): Payer: No Typology Code available for payment source

## 2018-04-09 ENCOUNTER — Emergency Department (HOSPITAL_COMMUNITY)
Admission: EM | Admit: 2018-04-09 | Discharge: 2018-04-09 | Disposition: A | Discharge status: Home / Self Care | Payer: No Typology Code available for payment source | Attending: Emergency Medicine | Admitting: Emergency Medicine

## 2018-04-09 ENCOUNTER — Emergency Department (HOSPITAL_COMMUNITY)
Admission: EM | Admit: 2018-04-09 | Discharge: 2018-04-09 | Disposition: A | Discharge status: Home / Self Care | Payer: No Typology Code available for payment source | Source: Home / Self Care | Attending: Emergency Medicine | Admitting: Emergency Medicine

## 2018-04-09 ENCOUNTER — Encounter (HOSPITAL_COMMUNITY): Payer: Self-pay

## 2018-04-09 ENCOUNTER — Other Ambulatory Visit: Payer: Self-pay

## 2018-04-09 DIAGNOSIS — J449 Chronic obstructive pulmonary disease, unspecified: Secondary | ICD-10-CM | POA: Insufficient documentation

## 2018-04-09 DIAGNOSIS — M25562 Pain in left knee: Secondary | ICD-10-CM | POA: Insufficient documentation

## 2018-04-09 DIAGNOSIS — Z87891 Personal history of nicotine dependence: Secondary | ICD-10-CM

## 2018-04-09 DIAGNOSIS — F10929 Alcohol use, unspecified with intoxication, unspecified: Secondary | ICD-10-CM

## 2018-04-09 DIAGNOSIS — F10229 Alcohol dependence with intoxication, unspecified: Secondary | ICD-10-CM | POA: Diagnosis not present

## 2018-04-09 DIAGNOSIS — F10920 Alcohol use, unspecified with intoxication, uncomplicated: Secondary | ICD-10-CM

## 2018-04-09 DIAGNOSIS — J441 Chronic obstructive pulmonary disease with (acute) exacerbation: Secondary | ICD-10-CM | POA: Insufficient documentation

## 2018-04-09 DIAGNOSIS — R062 Wheezing: Secondary | ICD-10-CM | POA: Diagnosis not present

## 2018-04-09 DIAGNOSIS — Z79899 Other long term (current) drug therapy: Secondary | ICD-10-CM | POA: Insufficient documentation

## 2018-04-09 DIAGNOSIS — F909 Attention-deficit hyperactivity disorder, unspecified type: Secondary | ICD-10-CM | POA: Insufficient documentation

## 2018-04-09 LAB — CBC WITH DIFFERENTIAL/PLATELET
Abs Immature Granulocytes: 0.02 10*3/uL (ref 0.00–0.07)
Basophils Absolute: 0 10*3/uL (ref 0.0–0.1)
Basophils Relative: 1 %
Eosinophils Absolute: 0.1 10*3/uL (ref 0.0–0.5)
Eosinophils Relative: 3 %
HCT: 32.2 % — ABNORMAL LOW (ref 36.0–46.0)
HEMOGLOBIN: 9.3 g/dL — AB (ref 12.0–15.0)
Immature Granulocytes: 1 %
LYMPHS ABS: 0.8 10*3/uL (ref 0.7–4.0)
Lymphocytes Relative: 23 %
MCH: 24.2 pg — ABNORMAL LOW (ref 26.0–34.0)
MCHC: 28.9 g/dL — ABNORMAL LOW (ref 30.0–36.0)
MCV: 83.6 fL (ref 80.0–100.0)
MONO ABS: 0.4 10*3/uL (ref 0.1–1.0)
Monocytes Relative: 11 %
Neutro Abs: 2 10*3/uL (ref 1.7–7.7)
Neutrophils Relative %: 61 %
Platelets: 174 10*3/uL (ref 150–400)
RBC: 3.85 MIL/uL — ABNORMAL LOW (ref 3.87–5.11)
RDW: 19.9 % — ABNORMAL HIGH (ref 11.5–15.5)
WBC: 3.3 10*3/uL — ABNORMAL LOW (ref 4.0–10.5)
nRBC: 0 % (ref 0.0–0.2)

## 2018-04-09 LAB — POCT I-STAT 7, (LYTES, BLD GAS, ICA,H+H)
Acid-Base Excess: 3 mmol/L — ABNORMAL HIGH (ref 0.0–2.0)
Bicarbonate: 30 mmol/L — ABNORMAL HIGH (ref 20.0–28.0)
Calcium, Ion: 1.09 mmol/L — ABNORMAL LOW (ref 1.15–1.40)
HCT: 30 % — ABNORMAL LOW (ref 36.0–46.0)
Hemoglobin: 10.2 g/dL — ABNORMAL LOW (ref 12.0–15.0)
O2 Saturation: 99 %
POTASSIUM: 3.5 mmol/L (ref 3.5–5.1)
Patient temperature: 98.6
Sodium: 143 mmol/L (ref 135–145)
TCO2: 32 mmol/L (ref 22–32)
pCO2 arterial: 58 mmHg — ABNORMAL HIGH (ref 32.0–48.0)
pH, Arterial: 7.322 — ABNORMAL LOW (ref 7.350–7.450)
pO2, Arterial: 161 mmHg — ABNORMAL HIGH (ref 83.0–108.0)

## 2018-04-09 LAB — BASIC METABOLIC PANEL
Anion gap: 8 (ref 5–15)
CO2: 29 mmol/L (ref 22–32)
Calcium: 8.1 mg/dL — ABNORMAL LOW (ref 8.9–10.3)
Chloride: 105 mmol/L (ref 98–111)
Creatinine, Ser: 0.37 mg/dL — ABNORMAL LOW (ref 0.44–1.00)
GFR calc Af Amer: >60 mL/min (ref >60)
Glucose, Bld: 101 mg/dL — ABNORMAL HIGH (ref 70–99)
Potassium: 3.5 mmol/L (ref 3.5–5.1)
Sodium: 142 mmol/L (ref 135–145)

## 2018-04-09 LAB — I-STAT TROPONIN, ED: Troponin i, poc: 0 ng/mL (ref 0.00–0.08)

## 2018-04-09 LAB — RAPID URINE DRUG SCREEN, HOSP PERFORMED
AMPHETAMINES: NOT DETECTED
Barbiturates: NOT DETECTED
Benzodiazepines: POSITIVE — AB
Cocaine: NOT DETECTED
Opiates: NOT DETECTED
Tetrahydrocannabinol: NOT DETECTED

## 2018-04-09 LAB — BRAIN NATRIURETIC PEPTIDE: B Natriuretic Peptide: 309.2 pg/mL — ABNORMAL HIGH (ref 0.0–100.0)

## 2018-04-09 LAB — ETHANOL: ALCOHOL ETHYL (B): 279 mg/dL — AB (ref <10)

## 2018-04-09 MED ORDER — PREDNISONE 20 MG PO TABS: ORAL_TABLET | ORAL | 0 refills | Status: DC

## 2018-04-09 MED ORDER — IPRATROPIUM-ALBUTEROL 0.5-2.5 (3) MG/3ML IN SOLN
3.0000 mL | Freq: Once | RESPIRATORY_TRACT | Status: AC
  Administered 2018-04-09: 3 mL via RESPIRATORY_TRACT
  Filled 2018-04-09: qty 3

## 2018-04-09 MED ORDER — ACETAMINOPHEN 500 MG PO TABS
1000.0000 mg | ORAL_TABLET | Freq: Once | ORAL | Status: AC
  Administered 2018-04-09: 1000 mg via ORAL
  Filled 2018-04-09: qty 2

## 2018-04-09 MED ORDER — FUROSEMIDE 10 MG/ML IJ SOLN
20.0000 mg | Freq: Once | INTRAMUSCULAR | Status: AC
  Administered 2018-04-09: 20 mg via INTRAVENOUS
  Filled 2018-04-09: qty 2

## 2018-04-09 MED ORDER — ONDANSETRON 4 MG PO TBDP
8.0000 mg | ORAL_TABLET | Freq: Once | ORAL | Status: AC
  Administered 2018-04-09: 8 mg via ORAL
  Filled 2018-04-09: qty 2

## 2018-04-09 MED ORDER — METHYLPREDNISOLONE SODIUM SUCC 125 MG IJ SOLR
125.0000 mg | Freq: Once | INTRAMUSCULAR | Status: AC
  Administered 2018-04-09: 125 mg via INTRAVENOUS
  Filled 2018-04-09: qty 2

## 2018-04-09 MED ORDER — DOXYCYCLINE HYCLATE 100 MG PO CAPS: 100.0000 mg | ORAL_CAPSULE | Freq: Two times a day (BID) | ORAL | 0 refills | Status: DC

## 2018-04-09 MED ORDER — LIDOCAINE 5 % EX PTCH: 1.0000 | MEDICATED_PATCH | CUTANEOUS | 0 refills | Status: DC

## 2018-04-09 MED ORDER — IPRATROPIUM-ALBUTEROL 0.5-2.5 (3) MG/3ML IN SOLN: 3.0000 mL | Freq: Once | RESPIRATORY_TRACT | Status: DC

## 2018-04-09 MED ORDER — LIDOCAINE 5 % EX PTCH
1.0000 | MEDICATED_PATCH | CUTANEOUS | Status: DC
  Administered 2018-04-09: 1 via TRANSDERMAL
  Filled 2018-04-09: qty 1

## 2018-04-09 MED ORDER — IBUPROFEN 800 MG PO TABS
800.0000 mg | ORAL_TABLET | Freq: Once | ORAL | Status: AC
  Administered 2018-04-09: 800 mg via ORAL
  Filled 2018-04-09: qty 1

## 2018-04-09 NOTE — ED Triage Notes (Signed)
Per [facility], pt from home w/ a c/o of left knee pain that she sustained from a fall yesterday. Pt has been non-ambulatory since the fall and requested EMS assistance into bed. EMS advised for the pt be evaluated for the knee pain and BLE that extends into her abd. Pt admits to ETOH intoxication on wine.   110/40 RR 6 92% RA --> 100% on 2 lpm HR 86  20 ga IV L hand

## 2018-04-09 NOTE — ED Provider Notes (Signed)
[facility] EMERGENCY DEPARTMENT Provider Note   CSN: [id number] Arrival date & time: 04/09/18  1514    History   Chief Complaint Chief Complaint  Patient presents with  . Fall    HPI Anne Black is a 58 y.o. female.  She is a very inconsistent historian.  She presents by ambulance for evaluation of left knee pain.  She said she had a fall and she struck her knee and has been giving her pain since then.  It is a little unclear when she fell.  She was seen earlier today for the same.  She had an x-ray that did not show any acute fractures, and a chest and hip x-ray that were stable.  She received steroids Lasix and a DuoNeb.  Patient states she went home and drank some more and had worsening knee pain so decided to come back here.  She has chronic shortness of breath and supposed to be on oxygen but sounds like she uses it inconsistently.  Continues to smoke and drink.  Sats were in the high 70s on arrival and improved with the nasal cannula oxygen.  Currently she is eating crackers and speaking in full sentences.     The history is provided by the patient.  Knee Pain  Location:  Knee Injury: yes   Mechanism of injury: fall   Knee location:  L knee Pain details:    Quality:  Unable to specify   Radiates to:  Does not radiate   Severity:  Severe   Onset quality:  Unable to specify   Timing:  Constant   Progression:  Unchanged Chronicity:  New Relieved by:  None tried Worsened by:  Bearing weight Ineffective treatments:  None tried Associated symptoms: no fever and no neck pain     Past Medical History:  Diagnosis Date  . ADHD (attention deficit hyperactivity disorder)   . Alcoholism (HCC)   . Anxiety   . Arthritis   . Ascites   . Bipolar disorder (HCC)   . Cirrhosis (HCC)   . COPD (chronic obstructive pulmonary disease) (HCC)   . Hypokalemia   . Migraine   . Multiple sclerosis (HCC)   . Narcolepsy   . Osteoporosis     Patient Active Problem  List   Diagnosis Date Noted  . Atrial fibrillation with rapid ventricular response (HCC) 03/23/2018  . Head injury   . Fall 03/19/2018  . Syncope 03/19/2018  . Orbital fracture 03/19/2018  . Scalp laceration 03/19/2018  . Acute respiratory failure with hypoxia and hypercarbia (HCC) 03/19/2018  . Abdominal tenderness 03/19/2018  . Acute on chronic respiratory failure with hypoxia (HCC) 03/17/2018  . Alcohol abuse with intoxication (HCC) 03/17/2018  . ETOH abuse   . Normocytic anemia 03/16/2018  . Acute metabolic encephalopathy 03/16/2018  . Lower leg edema 03/16/2018  . Migraine 03/16/2018  . A-fib (HCC) 02/18/2018  . Community acquired pneumonia of left lower lobe of lung (HCC)   . Weakness 08/21/2017  . COPD exacerbation (HCC) 06/12/2017  . Chest pain with moderate risk for cardiac etiology 05/30/2017  . Abnormal LFTs 05/30/2017  . Tachycardia 05/30/2017  . COPD (chronic obstructive pulmonary disease) (HCC) 04/19/2017  . Right rib fracture 04/15/2017  . Abdominal wall cellulitis 12/30/2016  . S/P exploratory laparotomy 12/30/2016  . Distal radius fracture, left 09/09/2016  . Thrombocytopenia (HCC) 09/09/2016  . Chest x-ray abnormality   . Umbilical hernia without obstruction and without gangrene 05/12/2016  . Itching 02/24/2016  .  Ventral hernia without obstruction or gangrene 02/24/2016  . Palliative care encounter   . Ascites   . Tachypnea   . Atrial flutter with rapid ventricular response (HCC)   . Hypomagnesemia   . Hypoxia   . Dehydration 01/22/2016  . Low back ache 12/30/2015  . Non-intractable vomiting with nausea   . Alcohol abuse with alcohol-induced mood disorder (HCC) 12/15/2015  . Acute alcoholism (HCC)   . Abdominal distension 11/04/2015  . Alcohol abuse   . Dyspnea   . Acute respiratory failure (HCC) 10/15/2015  . Ascites due to alcoholic cirrhosis (HCC) 10/15/2015  . Osteoporosis   . Multiple sclerosis (HCC)   . Alcoholic cirrhosis of liver with  ascites (HCC) 08/22/2015  . Bipolar disorder, current episode mixed, moderate (HCC) 08/01/2015  . Alcohol use disorder, severe, dependence (HCC) 07/31/2015  . Macrocytic anemia- due to alcohol abuse with normal B12 & folate levels 05/31/2015  . Severe protein-calorie malnutrition (HCC) 05/31/2015  . Hypokalemia 05/26/2015  . Encephalopathy, hepatic (HCC) 05/26/2015  . Alcohol withdrawal (HCC) 05/18/2015  . Abdominal pain 05/18/2015  . Anxiety 05/18/2015  . Severe sepsis (HCC) 05/18/2015  . UTI (lower urinary tract infection) 05/18/2015  . Stimulant abuse (HCC) 12/27/2014  . Nicotine abuse 12/27/2014  . Hyperprolactinemia (HCC) 11/15/2014  . Dyslipidemia   . Alcohol use disorder, moderate, dependence (HCC) 10/30/2014  . Tobacco abuse 01/23/2014  . Bipolar 1 disorder (HCC) 04/07/2013  . Noncompliance with therapeutic plan 04/04/2013  . Hereditary and idiopathic peripheral neuropathy 05/01/2012  . Depression 05/01/2012  . GERD (gastroesophageal reflux disease) 05/01/2012  . Osteoarthrosis, unspecified whether generalized or localized, involving lower leg 05/01/2012  . Hyponatremia 07/01/2011    Past Surgical History:  Procedure Laterality Date  . ABDOMINAL WALL DEFECT REPAIR N/A 12/30/2016   Procedure: EXPLORATORY LAPAROTOMY WITH REPAIR ABDOMINAL WALL VENTRAL HERNIA;  Surgeon: [name], MD;  Location: [facility] OR;  Service: General;  Laterality: N/A;  . APPLICATION OF WOUND VAC N/A 12/30/2016   Procedure: APPLICATION OF WOUND VAC;  Surgeon: [name], MD;  Location: [facility] OR;  Service: General;  Laterality: N/A;  . CESAREAN SECTION  [phone]  . FRACTURE SURGERY    . HERNIA REPAIR    . MYRINGOTOMY WITH TUBE PLACEMENT Bilateral   . ORIF WRIST FRACTURE Left 09/09/2016   Procedure: OPEN REDUCTION INTERNAL FIXATION (ORIF) LEFT WRIST FRACTURE, LEFT CARPAL TUNNEL RELEASE;  Surgeon: Dominica Severin, MD;  Location: [facility] ORS;  Service: Orthopedics;  Laterality: Left;  . TONSILLECTOMY        OB History   No obstetric history on file.      Home Medications    Prior to Admission medications   Medication Sig Start Date End Date Taking? Authorizing Provider  acetaminophen (TYLENOL) 500 MG tablet Take 1 tablet (500 mg total) by mouth every 6 (six) hours as needed for mild pain. 03/28/18   Rolly Salter, MD  aluminum-magnesium hydroxide-simethicone (MAALOX) 200-200-20 MG/5ML SUSP Take 30 mLs by mouth 4 (four) times daily as needed (indigestion).     [provider]  chlordiazePOXIDE (LIBRIUM) 25 MG capsule Take 1 capsule (25 mg total) by mouth 3 (three) times daily as needed for anxiety or withdrawal. 04/03/18   Cathren Laine, MD  diltiazem (CARDIZEM CD) 120 MG 24 hr capsule Take 1 capsule (120 mg total) by mouth daily. Patient not taking: Reported on 04/03/2018 03/29/18   Rolly Salter, MD  doxycycline (VIBRAMYCIN) 100 MG capsule Take 1 capsule (100 mg total) by mouth 2 (two)  times daily. One po bid x 7 days 04/09/18   Palumbo, April, MD  FLUoxetine (PROZAC) 20 MG capsule Take 1 capsule (20 mg total) by mouth daily. 03/28/18   Rolly Salter, MD  Fluticasone-Salmeterol [facility] INHUB) 250-50 MCG/DOSE AEPB Inhale 1 puff into the lungs 2 (two) times daily.    [provider]  folic acid (FOLVITE) 1 MG tablet Take 1 tablet (1 mg total) by mouth daily. Patient not taking: Reported on 04/03/2018 03/17/18   Clydie Braun, MD  furosemide (LASIX) 40 MG tablet Take 40 mg twice daily till 04/02/2018, after that take 40 mg daily. 03/28/18   Rolly Salter, MD  gabapentin (NEURONTIN) 100 MG capsule Take 2 capsules (200 mg total) by mouth 3 (three) times daily. 02/28/18   Charlynne Pander, MD  hydrocortisone cream 1 % Apply topically 3 (three) times daily. Patient not taking: Reported on 04/03/2018 03/28/18   Rolly Salter, MD  hydrOXYzine (ATARAX/VISTARIL) 25 MG tablet Take 1 tablet (25 mg total) by mouth daily as needed (anxiety). 03/17/18   Clydie Braun, MD    ipratropium-albuterol (DUONEB) 0.5-2.5 (3) MG/3ML SOLN Take 3 mLs by nebulization every 4 (four) hours as needed (For wheezing or shortness of breath.).    [provider]  lactulose (CHRONULAC) 10 GM/15ML solution Take 45 mLs (30 g total) by mouth 3 (three) times daily. Patient not taking: Reported on 04/03/2018 03/28/18   Rolly Salter, MD  lidocaine (LIDODERM) 5 % Place 1 patch onto the skin daily. Remove & Discard patch within 12 hours or as directed by MD 04/09/18   Nicanor Alcon, April, MD  nicotine (NICODERM CQ - DOSED IN MG/24 HOURS) 21 mg/24hr patch Place 1 patch (21 mg total) onto the skin daily. 03/29/18   Rolly Salter, MD  ondansetron (ZOFRAN-ODT) 4 MG disintegrating tablet Take 4 mg by mouth every 8 (eight) hours as needed for nausea or vomiting.    [provider]  pantoprazole (PROTONIX) 40 MG tablet Take 1 tablet (40 mg total) by mouth daily. 03/16/18   Curatolo, Adam, DO  predniSONE (DELTASONE) 20 MG tablet 3 tabs po day one, then 2 po daily x 4 days 04/09/18   Palumbo, April, MD  promethazine (PHENERGAN) 25 MG tablet Take 1 tablet (25 mg total) by mouth every 6 (six) hours as needed for nausea or vomiting. 03/17/18   Clydie Braun, MD  rizatriptan (MAXALT) 10 MG tablet Take 10 mg by mouth as needed for migraine. May repeat in 2 hours if needed    [provider]  spironolactone (ALDACTONE) 25 MG tablet Take 1 tablet (25 mg total) by mouth daily for 30 days. Patient not taking: Reported on 04/03/2018 03/28/18 04/27/18  Rolly Salter, MD  thiamine 100 MG tablet Take 1 tablet (100 mg total) by mouth daily. Patient not taking: Reported on 04/03/2018 03/28/18   Rolly Salter, MD  VENTOLIN HFA 108 [phone] Base) MCG/ACT inhaler Inhale 2 puffs into the lungs every 4 (four) hours as needed for wheezing or shortness of breath. 03/16/18   Virgina Norfolk, DO    Family History Family History  Problem Relation Age of Onset  . Arrhythmia Mother   . Heart disease Father   .  Hypertension Father     Social History Social History   Tobacco Use  . Smoking status: Former Smoker    Packs/day: 1.00    Years: 35.00    Pack years: 35.00    Types: Cigarettes  .  Smokeless tobacco: Never Used  Substance Use Topics  . Alcohol use: Yes    Comment: 1 bottle of wine per day per patient.   . Drug use: No     Allergies   Patient has no known allergies.   Review of Systems Review of Systems  Constitutional: Negative for fever.  HENT: Negative for sore throat.   Eyes: Negative for visual disturbance.  Respiratory: Positive for cough, shortness of breath and wheezing.   Cardiovascular: Negative for chest pain.  Gastrointestinal: Negative for abdominal pain.  Genitourinary: Negative for dysuria.  Musculoskeletal: Negative for neck pain.  Skin: Negative for rash.  Neurological: Negative for headaches.     Physical Exam Updated Vital Signs BP 107/63   Pulse 90   Temp 98.8 F (37.1 C) (Oral)   Resp 18   Ht 5\' 3"  (1.6 m)   Wt 81.6 kg   SpO2 100%   BMI 31.89 kg/m   Physical Exam Vitals signs and nursing note reviewed.  Constitutional:      General: She is not in acute distress.    Appearance: She is well-developed.  HENT:     Head: Normocephalic and atraumatic.  Eyes:     Conjunctiva/sclera: Conjunctivae normal.  Neck:     Musculoskeletal: Neck supple.  Cardiovascular:     Rate and Rhythm: Normal rate and regular rhythm.     Heart sounds: No murmur.  Pulmonary:     Effort: Pulmonary effort is normal. No respiratory distress.     Breath sounds: Wheezing present.  Abdominal:     Palpations: Abdomen is soft.     Tenderness: There is no abdominal tenderness.  Musculoskeletal: Normal range of motion.        General: Tenderness present.  Skin:    General: Skin is warm and dry.     Capillary Refill: Capillary refill takes less than 2 seconds.  Neurological:     General: No focal deficit present.     Mental Status: She is alert.      ED  Treatments / Results  Labs (all labs ordered are listed, but only abnormal results are displayed) Labs Reviewed - No data to display  EKG None  Radiology Dg Chest Portable 1 View  Result Date: 04/09/2018 CLINICAL DATA:  Initial evaluation for COPD. EXAM: PORTABLE CHEST 1 VIEW COMPARISON:  Prior radiograph from 03/23/2018 FINDINGS: Patient is markedly rotated to the right. Cardiomegaly grossly stable. Mediastinal silhouette within normal limits. Aortic atherosclerosis. Lungs hypoinflated. Underlying changes related to COPD. No focal infiltrates. No superimposed edema or pleural effusion. No pneumothorax. No acute osseous finding. IMPRESSION: 1. COPD.  No other superimposed active cardiopulmonary disease. 2. Stable mild cardiomegaly with aortic atherosclerosis. Electronically Signed   By: Rise Mu M.D.   On: 04/09/2018 04:33   Dg Knee Left Port  Result Date: 04/09/2018 CLINICAL DATA:  58 year old female with left knee pain. EXAM: PORTABLE LEFT KNEE - 1-2 VIEW COMPARISON:  None. FINDINGS: There is no acute fracture or dislocation. Mild osteopenia. No joint effusion. There is diffuse subcutaneous soft tissue edema. IMPRESSION: No acute fracture or dislocation. Electronically Signed   By: Elgie Collard M.D.   On: 04/09/2018 04:34   Dg Hip Port Unilat W Or Wo Pelvis 1 View Left  Result Date: 04/09/2018 CLINICAL DATA:  58 year old female with left hip pain. EXAM: DG HIP (WITH OR WITHOUT PELVIS) 1V PORT LEFT COMPARISON:  None. FINDINGS: There is no acute fracture or dislocation. No significant arthritic changes. The bones are  osteopenic. The soft tissues are grossly unremarkable. IMPRESSION: Negative. Electronically Signed   By: Elgie Collard M.D.   On: 04/09/2018 04:29    Procedures Procedures (including critical care time)  Medications Ordered in ED Medications - No data to display   Initial Impression / Assessment and Plan / ED Course  I have reviewed the triage vital signs  and the nursing notes.  Pertinent labs & imaging results that were available during my care of the patient were reviewed by me and considered in my medical decision making (see chart for details).  Clinical Course as of Apr 08 2341  Wynelle Link Apr 09, 2018  [id number] 58 year old female chronic alcoholic COPD with active smoking and poor compliance with oxygen and other medical therapies.  She is here with a complaint of left knee pain.  She was seen earlier this morning for same.  X-rays were negative at that time.  She is hypoxic here off of oxygen.  She had a chest x-ray earlier this morning that showed COPD.  We will give her a breathing treatment.  Currently no evidence of withdrawal.   [MB]  1800 Patient has been here and stable on nasal cannula oxygen.  She was given a breathing treatment.  I do not see any obvious need for her to be admitted to the hospital.  We will return her back to her home.   [MB]    Clinical Course User Index [MB] Terrilee Files, MD        Final Clinical Impressions(s) / ED Diagnoses   Final diagnoses:  Acute pain of left knee  COPD exacerbation (HCC)  Alcoholic intoxication without complication [facility])    ED Discharge Orders    None       Terrilee Files, MD 04/09/18 2343

## 2018-04-09 NOTE — ED Notes (Signed)
Pt has woken up multiple times and removed bipap mask. Reapplied and reiterated to pt the importance of leaving mask on.

## 2018-04-09 NOTE — ED Triage Notes (Signed)
Pt BIB [facility]. Pt called EMS for a fall. Pt was just discharged from [facility] today. Pt presented to the ED complaining of "pain all over." Pt reports drinking four or five glasses of wine upon arriving home today. Pt states she does not know why she called EMS today. Pt is unable to identify day of the week, but can correctly identify person and place. Pt reports 8/10 headache. Pt is 77% on room air upon arrival, 86% on 2L and 99% on 4L at present time.

## 2018-04-09 NOTE — ED Triage Notes (Signed)
Pt  Walking in hall with a walker unassisted. Pt reports using walker at home and Pt uses 3 liters nasal o2.

## 2018-04-09 NOTE — ED Notes (Signed)
Pt transported home via [facility] 

## 2018-04-09 NOTE — ED Triage Notes (Signed)
[facility]  At bedside for  DC home

## 2018-04-09 NOTE — ED Notes (Signed)
PT given AVS with RX. And all personal items in Pt belonging bags on stretcher.

## 2018-04-09 NOTE — ED Notes (Signed)
Pt removed bipap mask again, states she cannot tolerate it anymore. Will notify MD, 2LNC applied sats maintained >97%

## 2018-04-09 NOTE — ED Notes (Signed)
Called ptar at 9:03

## 2018-04-09 NOTE — ED Triage Notes (Signed)
PT up to [facility] BUT refuses to walk

## 2018-04-09 NOTE — Progress Notes (Signed)
Orthopedic Tech Progress Note Patient Details:  Anne Black [birth year] [id number]  Ortho Devices Type of Ortho Device: Knee Sleeve Ortho Device/Splint Location: left knee Ortho Device/Splint Interventions: Adjustment, Application, Ordered   Post Interventions Patient Tolerated: Well Instructions Provided: Care of device, Adjustment of device   Donald Pore 04/09/2018, 4:54 PM

## 2018-04-09 NOTE — ED Notes (Signed)
Pt discharge instructions given to patient and to PTAR. Pt given the opportunity to ask questions. Pt verbalized understanding. Report given to PT[facility]. Pt transported home via [facility].

## 2018-04-09 NOTE — ED Notes (Signed)
PTAR called @1819 -per RN-called by Marylene Land

## 2018-04-09 NOTE — Discharge Instructions (Signed)
You were seen in the emergency department for left knee pain.  You had x-rays earlier this morning that did not show any fractures.  We gave you a knee brace to help support that area.  You should use ice to the affected area.  Will be important for you to stop smoking and continue your breathing treatments.  Please follow-up with your doctor or return if any worsening symptoms.

## 2018-04-09 NOTE — ED Notes (Signed)
Pt ok to eat and ambulate per Dr. Nicanor Alcon. Sats maintaining >96% on 2LNC. Given [place] sandwich

## 2018-04-09 NOTE — ED Provider Notes (Signed)
[facility] EMERGENCY DEPARTMENT Provider Note   CSN: [id number] Arrival date & time: 04/09/18  0219    History   Chief Complaint Chief Complaint  Patient presents with  . Leg Swelling  . Knee Pain  . Alcohol Intoxication    HPI Anne Black is a 57 y.o. female.     The history is provided by the patient. The history is limited by the condition of the patient.  Knee Pain  Location:  Knee Injury: yes   Mechanism of injury: fall   Fall:    Fall occurred:  Standing   Impact surface:  Hard floor   Point of impact:  Knees   Entrapped after fall: no   Knee location:  L knee Pain details:    Quality:  Aching   Radiates to:  Does not radiate   Severity:  Severe   Onset quality:  Sudden   Progression:  Unchanged Chronicity:  New Dislocation: no   Foreign body present:  No foreign bodies Prior injury to area:  No Relieved by:  Nothing Worsened by:  Nothing Ineffective treatments:  None tried Associated symptoms: no back pain and no fever   Risk factors: obesity   Risk factors: no concern for non-accidental trauma   Alcohol Intoxication  This is a recurrent problem. The current episode started 6 to 12 hours ago. The problem occurs constantly. The problem has not changed since onset.Pertinent negatives include no chest pain, no abdominal pain and no headaches. Nothing aggravates the symptoms. Nothing relieves the symptoms. She has tried nothing for the symptoms. The treatment provided no relief.  Patient with COPD denied ETOH use originally.  States several days ago she fell and has pain in her left knee.  Denies CP.  Denies DOE. Is supposed to be on chronic home oxygen.    Past Medical History:  Diagnosis Date  . ADHD (attention deficit hyperactivity disorder)   . Alcoholism (HCC)   . Anxiety   . Arthritis   . Ascites   . Bipolar disorder (HCC)   . Cirrhosis (HCC)   . COPD (chronic obstructive pulmonary disease) (HCC)   . Hypokalemia   .  Migraine   . Multiple sclerosis (HCC)   . Narcolepsy   . Osteoporosis     Patient Active Problem List   Diagnosis Date Noted  . Atrial fibrillation with rapid ventricular response (HCC) 03/23/2018  . Head injury   . Fall 03/19/2018  . Syncope 03/19/2018  . Orbital fracture 03/19/2018  . Scalp laceration 03/19/2018  . Acute respiratory failure with hypoxia and hypercarbia (HCC) 03/19/2018  . Abdominal tenderness 03/19/2018  . Acute on chronic respiratory failure with hypoxia (HCC) 03/17/2018  . Alcohol abuse with intoxication (HCC) 03/17/2018  . ETOH abuse   . Normocytic anemia 03/16/2018  . Acute metabolic encephalopathy 03/16/2018  . Lower leg edema 03/16/2018  . Migraine 03/16/2018  . A-fib (HCC) 02/18/2018  . Community acquired pneumonia of left lower lobe of lung (HCC)   . Weakness 08/21/2017  . COPD exacerbation (HCC) 06/12/2017  . Chest pain with moderate risk for cardiac etiology 05/30/2017  . Abnormal LFTs 05/30/2017  . Tachycardia 05/30/2017  . COPD (chronic obstructive pulmonary disease) (HCC) 04/19/2017  . Right rib fracture 04/15/2017  . Abdominal wall cellulitis 12/30/2016  . S/P exploratory laparotomy 12/30/2016  . Distal radius fracture, left 09/09/2016  . Thrombocytopenia (HCC) 09/09/2016  . Chest x-ray abnormality   . Umbilical hernia without obstruction and without gangrene 05/12/2016  .  Itching 02/24/2016  . Ventral hernia without obstruction or gangrene 02/24/2016  . Palliative care encounter   . Ascites   . Tachypnea   . Atrial flutter with rapid ventricular response (HCC)   . Hypomagnesemia   . Hypoxia   . Dehydration 01/22/2016  . Low back ache 12/30/2015  . Non-intractable vomiting with nausea   . Alcohol abuse with alcohol-induced mood disorder (HCC) 12/15/2015  . Acute alcoholism (HCC)   . Abdominal distension 11/04/2015  . Alcohol abuse   . Dyspnea   . Acute respiratory failure (HCC) 10/15/2015  . Ascites due to alcoholic cirrhosis  (HCC) 10/15/2015  . Osteoporosis   . Multiple sclerosis (HCC)   . Alcoholic cirrhosis of liver with ascites (HCC) 08/22/2015  . Bipolar disorder, current episode mixed, moderate (HCC) 08/01/2015  . Alcohol use disorder, severe, dependence (HCC) 07/31/2015  . Macrocytic anemia- due to alcohol abuse with normal B12 & folate levels 05/31/2015  . Severe protein-calorie malnutrition (HCC) 05/31/2015  . Hypokalemia 05/26/2015  . Encephalopathy, hepatic (HCC) 05/26/2015  . Alcohol withdrawal (HCC) 05/18/2015  . Abdominal pain 05/18/2015  . Anxiety 05/18/2015  . Severe sepsis (HCC) 05/18/2015  . UTI (lower urinary tract infection) 05/18/2015  . Stimulant abuse (HCC) 12/27/2014  . Nicotine abuse 12/27/2014  . Hyperprolactinemia (HCC) 11/15/2014  . Dyslipidemia   . Alcohol use disorder, moderate, dependence (HCC) 10/30/2014  . Tobacco abuse 01/23/2014  . Bipolar 1 disorder (HCC) 04/07/2013  . Noncompliance with therapeutic plan 04/04/2013  . Hereditary and idiopathic peripheral neuropathy 05/01/2012  . Depression 05/01/2012  . GERD (gastroesophageal reflux disease) 05/01/2012  . Osteoarthrosis, unspecified whether generalized or localized, involving lower leg 05/01/2012  . Hyponatremia 07/01/2011    Past Surgical History:  Procedure Laterality Date  . ABDOMINAL WALL DEFECT REPAIR N/A 12/30/2016   Procedure: EXPLORATORY LAPAROTOMY WITH REPAIR ABDOMINAL WALL VENTRAL HERNIA;  Surgeon: [name], MD;  Location: [name] OR;  Service: General;  Laterality: N/A;  . APPLICATION OF WOUND VAC N/A 12/30/2016   Procedure: APPLICATION OF WOUND VAC;  Surgeon: [name], MD;  Location: [facility] OR;  Service: General;  Laterality: N/A;  . CESAREAN SECTION  [phone]  . FRACTURE SURGERY    . HERNIA REPAIR    . MYRINGOTOMY WITH TUBE PLACEMENT Bilateral   . ORIF WRIST FRACTURE Left 09/09/2016   Procedure: OPEN REDUCTION INTERNAL FIXATION (ORIF) LEFT WRIST FRACTURE, LEFT CARPAL TUNNEL RELEASE;   Surgeon: Dominica Severin, MD;  Location: [facility] ORS;  Service: Orthopedics;  Laterality: Left;  . TONSILLECTOMY       OB History   No obstetric history on file.      Home Medications    Prior to Admission medications   Medication Sig Start Date End Date Taking? Authorizing Provider  acetaminophen (TYLENOL) 500 MG tablet Take 1 tablet (500 mg total) by mouth every 6 (six) hours as needed for mild pain. 03/28/18   Rolly Salter, MD  aluminum-magnesium hydroxide-simethicone (MAALOX) 200-200-20 MG/5ML SUSP Take 30 mLs by mouth 4 (four) times daily as needed (indigestion).     [provider]  chlordiazePOXIDE (LIBRIUM) 25 MG capsule Take 1 capsule (25 mg total) by mouth 3 (three) times daily as needed for anxiety or withdrawal. 04/03/18   Cathren Laine, MD  diltiazem (CARDIZEM CD) 120 MG 24 hr capsule Take 1 capsule (120 mg total) by mouth daily. Patient not taking: Reported on 04/03/2018 03/29/18   Rolly Salter, MD  FLUoxetine (PROZAC) 20 MG capsule Take 1 capsule (20 mg total)  by mouth daily. 03/28/18   Rolly Salter, MD  Fluticasone-Salmeterol [facility] INHUB) 250-50 MCG/DOSE AEPB Inhale 1 puff into the lungs 2 (two) times daily.    [provider]  folic acid (FOLVITE) 1 MG tablet Take 1 tablet (1 mg total) by mouth daily. Patient not taking: Reported on 04/03/2018 03/17/18   Clydie Braun, MD  furosemide (LASIX) 40 MG tablet Take 40 mg twice daily till 04/02/2018, after that take 40 mg daily. 03/28/18   Rolly Salter, MD  gabapentin (NEURONTIN) 100 MG capsule Take 2 capsules (200 mg total) by mouth 3 (three) times daily. 02/28/18   Charlynne Pander, MD  hydrocortisone cream 1 % Apply topically 3 (three) times daily. Patient not taking: Reported on 04/03/2018 03/28/18   Rolly Salter, MD  hydrOXYzine (ATARAX/VISTARIL) 25 MG tablet Take 1 tablet (25 mg total) by mouth daily as needed (anxiety). 03/17/18   Clydie Braun, MD  ipratropium-albuterol (DUONEB) 0.5-2.5 (3) MG/3ML  SOLN Take 3 mLs by nebulization every 4 (four) hours as needed (For wheezing or shortness of breath.).    [provider]  lactulose (CHRONULAC) 10 GM/15ML solution Take 45 mLs (30 g total) by mouth 3 (three) times daily. Patient not taking: Reported on 04/03/2018 03/28/18   Rolly Salter, MD  nicotine (NICODERM CQ - DOSED IN MG/24 HOURS) 21 mg/24hr patch Place 1 patch (21 mg total) onto the skin daily. 03/29/18   Rolly Salter, MD  ondansetron (ZOFRAN-ODT) 4 MG disintegrating tablet Take 4 mg by mouth every 8 (eight) hours as needed for nausea or vomiting.    [provider]  pantoprazole (PROTONIX) 40 MG tablet Take 1 tablet (40 mg total) by mouth daily. 03/16/18   Curatolo, Adam, DO  promethazine (PHENERGAN) 25 MG tablet Take 1 tablet (25 mg total) by mouth every 6 (six) hours as needed for nausea or vomiting. 03/17/18   Clydie Braun, MD  rizatriptan (MAXALT) 10 MG tablet Take 10 mg by mouth as needed for migraine. May repeat in 2 hours if needed    [provider]  spironolactone (ALDACTONE) 25 MG tablet Take 1 tablet (25 mg total) by mouth daily for 30 days. Patient not taking: Reported on 04/03/2018 03/28/18 04/27/18  Rolly Salter, MD  thiamine 100 MG tablet Take 1 tablet (100 mg total) by mouth daily. Patient not taking: Reported on 04/03/2018 03/28/18   Rolly Salter, MD  VENTOLIN HFA 108 [phone] Base) MCG/ACT inhaler Inhale 2 puffs into the lungs every 4 (four) hours as needed for wheezing or shortness of breath. 03/16/18   Virgina Norfolk, DO    Family History Family History  Problem Relation Age of Onset  . Arrhythmia Mother   . Heart disease Father   . Hypertension Father     Social History Social History   Tobacco Use  . Smoking status: Former Smoker    Packs/day: 1.00    Years: 35.00    Pack years: 35.00    Types: Cigarettes  . Smokeless tobacco: Never Used  Substance Use Topics  . Alcohol use: Yes    Comment: 1 bottle of wine per day per patient.    . Drug use: No     Allergies   Patient has no known allergies.   Review of Systems Review of Systems  Unable to perform ROS: Other  Constitutional: Negative for fever.  Eyes: Negative for visual disturbance.  Respiratory: Positive for cough and wheezing.   Cardiovascular: Negative  for chest pain, palpitations and leg swelling.  Gastrointestinal: Negative for abdominal pain.  Genitourinary: Negative for difficulty urinating.  Musculoskeletal: Positive for arthralgias. Negative for back pain.  Neurological: Negative for dizziness, facial asymmetry, speech difficulty, weakness, light-headedness, numbness and headaches.     Physical Exam Updated Vital Signs BP 119/67   Pulse 88   Resp (!) 22   Ht 5\' 3"  (1.6 m)   Wt 81.6 kg   SpO2 97%   BMI 31.89 kg/m   Physical Exam Vitals signs and nursing note reviewed.  Constitutional:      General: She is not in acute distress.    Appearance: She is obese.  HENT:     Head: Normocephalic and atraumatic.     Nose: Nose normal.     Mouth/Throat:     Pharynx: Oropharynx is clear.  Eyes:     Conjunctiva/sclera: Conjunctivae normal.     Pupils: Pupils are equal, round, and reactive to light.  Neck:     Musculoskeletal: Normal range of motion and neck supple.  Cardiovascular:     Rate and Rhythm: Normal rate and regular rhythm.     Pulses: Normal pulses.     Heart sounds: Normal heart sounds.  Pulmonary:     Effort: No respiratory distress.     Breath sounds: No stridor. Wheezing present. No rhonchi or rales.  Chest:     Chest wall: No tenderness.  Abdominal:     General: Abdomen is flat. Bowel sounds are normal.     Tenderness: There is no abdominal tenderness. There is no guarding or rebound.  Musculoskeletal: Normal range of motion.        General: No tenderness.     Right wrist: Normal.     Left wrist: Normal.     Right hip: Normal.     Left hip: Normal.     Right knee: Normal.     Left knee: Normal.     Right  ankle: Normal.     Left ankle: Normal. Achilles tendon normal.     Cervical back: Normal.     Thoracic back: Normal.     Lumbar back: Normal.     Right lower leg: She exhibits no tenderness, no bony tenderness, no swelling, no deformity and no laceration.     Left lower leg: She exhibits no tenderness, no bony tenderness, no swelling, no deformity and no laceration.     Right foot: Normal.     Left foot: Normal.     Comments: 1 plus edema to mid shin B  Skin:    General: Skin is warm and dry.     Capillary Refill: Capillary refill takes less than 2 seconds.  Neurological:     General: No focal deficit present.     Mental Status: She is oriented to person, place, and time.     Coordination: Coordination normal.     Deep Tendon Reflexes: Reflexes normal.  Psychiatric:        Thought Content: Thought content normal.      ED Treatments / Results  Labs (all labs ordered are listed, but only abnormal results are displayed) Labs Reviewed  CBC WITH DIFFERENTIAL/PLATELET - Abnormal; Notable for the following components:      Result Value   WBC 3.3 (*)    RBC 3.85 (*)    Hemoglobin 9.3 (*)    HCT 32.2 (*)    MCH 24.2 (*)    MCHC 28.9 (*)    RDW 19.9 (*)  All other components within normal limits  BASIC METABOLIC PANEL - Abnormal; Notable for the following components:   Glucose, Bld 101 (*)    BUN <5 (*)    Creatinine, Ser 0.37 (*)    Calcium 8.1 (*)    All other components within normal limits  BRAIN NATRIURETIC PEPTIDE - Abnormal; Notable for the following components:   B Natriuretic Peptide 309.2 (*)    All other components within normal limits  ETHANOL - Abnormal; Notable for the following components:   Alcohol, Ethyl (B) 279 (*)    All other components within normal limits  RAPID URINE DRUG SCREEN, HOSP PERFORMED - Abnormal; Notable for the following components:   Benzodiazepines POSITIVE (*)    All other components within normal limits  POCT I-STAT 7, (LYTES, BLD  GAS, ICA,H+H) - Abnormal; Notable for the following components:   pH, Arterial 7.322 (*)    pCO2 arterial 58.0 (*)    pO2, Arterial 161.0 (*)    Bicarbonate 30.0 (*)    Acid-Base Excess 3.0 (*)    Calcium, Ion 1.09 (*)    HCT 30.0 (*)    Hemoglobin 10.2 (*)    All other components within normal limits  I-STAT TROPONIN, ED    EKG  EKG Interpretation  Date/Time:  Sunday April 09 2018 07:29:42 EST Ventricular Rate:  99 PR Interval:    QRS Duration: 88 QT Interval:  371 QTC Calculation: 477 R Axis:   78 Text Interpretation:  Sinus rhythm Nonspecific T abnormalities, lateral leads No significant change since last tracing Confirmed by Alvira Monday ([id number]) on 04/09/2018 7:41:12 AM       Radiology Dg Chest Portable 1 View  Result Date: 04/09/2018 CLINICAL DATA:  Initial evaluation for COPD. EXAM: PORTABLE CHEST 1 VIEW COMPARISON:  Prior radiograph from 03/23/2018 FINDINGS: Patient is markedly rotated to the right. Cardiomegaly grossly stable. Mediastinal silhouette within normal limits. Aortic atherosclerosis. Lungs hypoinflated. Underlying changes related to COPD. No focal infiltrates. No superimposed edema or pleural effusion. No pneumothorax. No acute osseous finding. IMPRESSION: 1. COPD.  No other superimposed active cardiopulmonary disease. 2. Stable mild cardiomegaly with aortic atherosclerosis. Electronically Signed   By: Rise Mu M.D.   On: 04/09/2018 04:33   Dg Knee Left Port  Result Date: 04/09/2018 CLINICAL DATA:  58 year old female with left knee pain. EXAM: PORTABLE LEFT KNEE - 1-2 VIEW COMPARISON:  None. FINDINGS: There is no acute fracture or dislocation. Mild osteopenia. No joint effusion. There is diffuse subcutaneous soft tissue edema. IMPRESSION: No acute fracture or dislocation. Electronically Signed   By: Elgie Collard M.D.   On: 04/09/2018 04:34   Dg Hip Port Unilat W Or Wo Pelvis 1 View Left  Result Date: 04/09/2018 CLINICAL DATA:  58 year old  female with left hip pain. EXAM: DG HIP (WITH OR WITHOUT PELVIS) 1V PORT LEFT COMPARISON:  None. FINDINGS: There is no acute fracture or dislocation. No significant arthritic changes. The bones are osteopenic. The soft tissues are grossly unremarkable. IMPRESSION: Negative. Electronically Signed   By: Elgie Collard M.D.   On: 04/09/2018 04:29    Procedures Procedures (including critical care time)  Medications Ordered in ED Medications  ipratropium-albuterol (DUONEB) 0.5-2.5 (3) MG/3ML nebulizer solution 3 mL (has no administration in time range)  methylPREDNISolone sodium succinate (SOLU-MEDROL) 125 mg/2 mL injection 125 mg (125 mg Intravenous Given 04/09/18 0542)  furosemide (LASIX) injection 20 mg (20 mg Intravenous Given 04/09/18 0542)     No further wheezing post nebulizer treatment, will  start steroids for home.  No CP. No calf pain.  Is no longer intoxicated.  Ambulated in the hallways.  PO challenged successfully.  At this time there are no admission criteria.    Final Clinical Impressions(s) / ED Diagnoses   Return for pain, intractable cough, fevers >100.4 unrelieved by medication, shortness of breath, intractable vomiting, chest pain, shortness of breath, weakness numbness, changes in speech, facial asymmetry,abdominal pain, passing out,Inability to tolerate liquids or food, cough, altered mental status or any concerns. No signs of systemic illness or infection. The patient is nontoxic-appearing on exam and vital signs are within normal limits.   I have reviewed the triage vital signs and the nursing notes. Pertinent labs &imaging results that were available during my care of the patient were reviewed by me and considered in my medical decision making (see chart for details).  After history, exam, and medical workup I feel the patient has been appropriately medically screened and is safe for discharge home. Pertinent diagnoses were discussed with the patient. Patient was  given return precautions.   Audriana Aldama, MD 04/09/18 0900

## 2018-04-09 NOTE — ED Notes (Signed)
Attempted to assist pt to ambulate. Able to sit on edge of bed and stand with assistance. O2 sat dropped from 95% to 88% during this activity, provided 2L O2 as pt wear at home sporadically. O2 sat returned to 93%, and pt was able to transfer to bedside commode but refuses to ambulate further due to shob and ha. Pt assisted back to bed and now is resting with O2 sat at 96% on 2L with resp rate at 20. RN notified

## 2018-04-11 ENCOUNTER — Observation Stay (HOSPITAL_COMMUNITY)
Admission: EM | Admit: 2018-04-11 | Discharge: 2018-04-12 | Disposition: A | Discharge status: Home / Self Care | Payer: 59 | Attending: Internal Medicine | Admitting: Internal Medicine

## 2018-04-11 ENCOUNTER — Emergency Department (HOSPITAL_COMMUNITY): Payer: 59

## 2018-04-11 ENCOUNTER — Other Ambulatory Visit: Payer: Self-pay

## 2018-04-11 ENCOUNTER — Encounter (HOSPITAL_COMMUNITY): Payer: Self-pay

## 2018-04-11 DIAGNOSIS — Z8249 Family history of ischemic heart disease and other diseases of the circulatory system: Secondary | ICD-10-CM | POA: Insufficient documentation

## 2018-04-11 DIAGNOSIS — Z9111 Patient's noncompliance with dietary regimen: Secondary | ICD-10-CM | POA: Diagnosis not present

## 2018-04-11 DIAGNOSIS — Z87891 Personal history of nicotine dependence: Secondary | ICD-10-CM | POA: Insufficient documentation

## 2018-04-11 DIAGNOSIS — K219 Gastro-esophageal reflux disease without esophagitis: Secondary | ICD-10-CM | POA: Insufficient documentation

## 2018-04-11 DIAGNOSIS — Z9114 Patient's other noncompliance with medication regimen: Secondary | ICD-10-CM | POA: Diagnosis not present

## 2018-04-11 DIAGNOSIS — J9621 Acute and chronic respiratory failure with hypoxia: Principal | ICD-10-CM | POA: Insufficient documentation

## 2018-04-11 DIAGNOSIS — I5033 Acute on chronic diastolic (congestive) heart failure: Secondary | ICD-10-CM | POA: Insufficient documentation

## 2018-04-11 DIAGNOSIS — G35 Multiple sclerosis: Secondary | ICD-10-CM | POA: Diagnosis not present

## 2018-04-11 DIAGNOSIS — G43909 Migraine, unspecified, not intractable, without status migrainosus: Secondary | ICD-10-CM | POA: Diagnosis not present

## 2018-04-11 DIAGNOSIS — M810 Age-related osteoporosis without current pathological fracture: Secondary | ICD-10-CM | POA: Diagnosis not present

## 2018-04-11 DIAGNOSIS — Z9882 Breast implant status: Secondary | ICD-10-CM | POA: Diagnosis not present

## 2018-04-11 DIAGNOSIS — Z9981 Dependence on supplemental oxygen: Secondary | ICD-10-CM | POA: Insufficient documentation

## 2018-04-11 DIAGNOSIS — I480 Paroxysmal atrial fibrillation: Secondary | ICD-10-CM | POA: Insufficient documentation

## 2018-04-11 DIAGNOSIS — I4892 Unspecified atrial flutter: Secondary | ICD-10-CM | POA: Insufficient documentation

## 2018-04-11 DIAGNOSIS — J9611 Chronic respiratory failure with hypoxia: Secondary | ICD-10-CM | POA: Diagnosis present

## 2018-04-11 DIAGNOSIS — F319 Bipolar disorder, unspecified: Secondary | ICD-10-CM | POA: Insufficient documentation

## 2018-04-11 DIAGNOSIS — E785 Hyperlipidemia, unspecified: Secondary | ICD-10-CM | POA: Diagnosis not present

## 2018-04-11 DIAGNOSIS — F419 Anxiety disorder, unspecified: Secondary | ICD-10-CM | POA: Insufficient documentation

## 2018-04-11 DIAGNOSIS — F10129 Alcohol abuse with intoxication, unspecified: Secondary | ICD-10-CM | POA: Insufficient documentation

## 2018-04-11 DIAGNOSIS — K7031 Alcoholic cirrhosis of liver with ascites: Secondary | ICD-10-CM | POA: Insufficient documentation

## 2018-04-11 DIAGNOSIS — G47419 Narcolepsy without cataplexy: Secondary | ICD-10-CM | POA: Diagnosis not present

## 2018-04-11 DIAGNOSIS — J449 Chronic obstructive pulmonary disease, unspecified: Secondary | ICD-10-CM | POA: Diagnosis not present

## 2018-04-11 DIAGNOSIS — F909 Attention-deficit hyperactivity disorder, unspecified type: Secondary | ICD-10-CM | POA: Diagnosis not present

## 2018-04-11 DIAGNOSIS — Z79899 Other long term (current) drug therapy: Secondary | ICD-10-CM | POA: Diagnosis not present

## 2018-04-11 HISTORY — DX: Pneumonia, unspecified organism: J18.9

## 2018-04-11 HISTORY — DX: Dyspnea, unspecified: R06.00

## 2018-04-11 HISTORY — DX: Personal history of other diseases of the digestive system: Z87.19

## 2018-04-11 LAB — CBC WITH DIFFERENTIAL/PLATELET
Abs Immature Granulocytes: 0.01 10*3/uL (ref 0.00–0.07)
BASOS PCT: 1 %
Basophils Absolute: 0 10*3/uL (ref 0.0–0.1)
Eosinophils Absolute: 0.1 10*3/uL (ref 0.0–0.5)
Eosinophils Relative: 2 %
HCT: 33.8 % — ABNORMAL LOW (ref 36.0–46.0)
Hemoglobin: 9.3 g/dL — ABNORMAL LOW (ref 12.0–15.0)
Immature Granulocytes: 0 %
Lymphocytes Relative: 14 %
Lymphs Abs: 0.6 10*3/uL — ABNORMAL LOW (ref 0.7–4.0)
MCH: 23.9 pg — ABNORMAL LOW (ref 26.0–34.0)
MCHC: 27.5 g/dL — ABNORMAL LOW (ref 30.0–36.0)
MCV: 86.9 fL (ref 80.0–100.0)
Monocytes Absolute: 0.4 10*3/uL (ref 0.1–1.0)
Monocytes Relative: 9 %
Neutro Abs: 3.1 10*3/uL (ref 1.7–7.7)
Neutrophils Relative %: 74 %
Platelets: 165 10*3/uL (ref 150–400)
RBC: 3.89 MIL/uL (ref 3.87–5.11)
RDW: 19.6 % — ABNORMAL HIGH (ref 11.5–15.5)
WBC: 4.2 10*3/uL (ref 4.0–10.5)
nRBC: 0 % (ref 0.0–0.2)

## 2018-04-11 LAB — BRAIN NATRIURETIC PEPTIDE: B Natriuretic Peptide: 388.7 pg/mL — ABNORMAL HIGH (ref 0.0–100.0)

## 2018-04-11 LAB — URINALYSIS, ROUTINE W REFLEX MICROSCOPIC
Bilirubin Urine: NEGATIVE
Glucose, UA: NEGATIVE mg/dL
Hgb urine dipstick: NEGATIVE
Ketones, ur: 5 mg/dL — AB
Nitrite: POSITIVE — AB
Protein, ur: NEGATIVE mg/dL
Specific Gravity, Urine: 1.015 (ref 1.005–1.030)
pH: 7 (ref 5.0–8.0)

## 2018-04-11 LAB — RAPID URINE DRUG SCREEN, HOSP PERFORMED
Amphetamines: NOT DETECTED
Barbiturates: NOT DETECTED
Benzodiazepines: POSITIVE — AB
Cocaine: NOT DETECTED
OPIATES: NOT DETECTED
Tetrahydrocannabinol: NOT DETECTED

## 2018-04-11 LAB — COMPREHENSIVE METABOLIC PANEL
ALT: 15 U/L (ref 0–44)
AST: 26 U/L (ref 15–41)
Albumin: 3.5 g/dL (ref 3.5–5.0)
Alkaline Phosphatase: 92 U/L (ref 38–126)
Anion gap: 9 (ref 5–15)
BILIRUBIN TOTAL: 1.2 mg/dL (ref 0.3–1.2)
BUN: 8 mg/dL (ref 6–20)
CO2: 29 mmol/L (ref 22–32)
Calcium: 8.2 mg/dL — ABNORMAL LOW (ref 8.9–10.3)
Chloride: 99 mmol/L (ref 98–111)
Creatinine, Ser: 0.43 mg/dL — ABNORMAL LOW (ref 0.44–1.00)
GFR calc Af Amer: >60 mL/min (ref >60)
GFR calc non Af Amer: >60 mL/min (ref >60)
Glucose, Bld: 96 mg/dL (ref 70–99)
Potassium: 3.4 mmol/L — ABNORMAL LOW (ref 3.5–5.1)
Sodium: 137 mmol/L (ref 135–145)
TOTAL PROTEIN: 6.3 g/dL — AB (ref 6.5–8.1)

## 2018-04-11 LAB — LIPASE, BLOOD: LIPASE: 30 U/L (ref 11–51)

## 2018-04-11 LAB — BLOOD GAS, ARTERIAL
Acid-Base Excess: 7.3 mmol/L — ABNORMAL HIGH (ref 0.0–2.0)
Bicarbonate: 32.6 mmol/L — ABNORMAL HIGH (ref 20.0–28.0)
Drawn by: 270211
O2 Content: 2.5 L/min
O2 Saturation: 99.2 %
PCO2 ART: 53.5 mmHg — AB (ref 32.0–48.0)
PH ART: 7.402 (ref 7.350–7.450)
Patient temperature: 98.6
pO2, Arterial: 132 mmHg — ABNORMAL HIGH (ref 83.0–108.0)

## 2018-04-11 LAB — ETHANOL: Alcohol, Ethyl (B): <10 mg/dL (ref <10)

## 2018-04-11 LAB — AMMONIA: Ammonia: 12 umol/L (ref 9–35)

## 2018-04-11 MED ORDER — THIAMINE HCL 100 MG/ML IJ SOLN
100.0000 mg | Freq: Every day | INTRAMUSCULAR | Status: DC
  Filled 2018-04-11: qty 2

## 2018-04-11 MED ORDER — ACETAMINOPHEN 650 MG RE SUPP: 650.0000 mg | Freq: Four times a day (QID) | RECTAL | Status: DC | PRN

## 2018-04-11 MED ORDER — PROMETHAZINE HCL 25 MG PO TABS: 25.0000 mg | ORAL_TABLET | Freq: Four times a day (QID) | ORAL | Status: DC | PRN

## 2018-04-11 MED ORDER — SUMATRIPTAN SUCCINATE 50 MG PO TABS
50.0000 mg | ORAL_TABLET | ORAL | Status: DC | PRN
  Filled 2018-04-11 (×2): qty 1

## 2018-04-11 MED ORDER — ALBUTEROL SULFATE (2.5 MG/3ML) 0.083% IN NEBU
2.5000 mg | INHALATION_SOLUTION | RESPIRATORY_TRACT | Status: DC | PRN
  Administered 2018-04-12: 2.5 mg via RESPIRATORY_TRACT
  Filled 2018-04-11: qty 3

## 2018-04-11 MED ORDER — LORAZEPAM 1 MG PO TABS: 0.0000 mg | ORAL_TABLET | Freq: Four times a day (QID) | ORAL | Status: DC

## 2018-04-11 MED ORDER — ENOXAPARIN SODIUM 40 MG/0.4ML ~~LOC~~ SOLN
40.0000 mg | SUBCUTANEOUS | Status: DC
  Administered 2018-04-11: 40 mg via SUBCUTANEOUS
  Filled 2018-04-11: qty 0.4

## 2018-04-11 MED ORDER — ADULT MULTIVITAMIN W/MINERALS CH
1.0000 | ORAL_TABLET | Freq: Every day | ORAL | Status: DC
  Administered 2018-04-11 – 2018-04-12 (×2): 1 via ORAL
  Filled 2018-04-11 (×2): qty 1

## 2018-04-11 MED ORDER — LORAZEPAM 1 MG PO TABS: 0.0000 mg | ORAL_TABLET | Freq: Two times a day (BID) | ORAL | Status: DC

## 2018-04-11 MED ORDER — LOPERAMIDE HCL 2 MG PO CAPS
2.0000 mg | ORAL_CAPSULE | Freq: Once | ORAL | Status: AC
  Administered 2018-04-11: 2 mg via ORAL
  Filled 2018-04-11: qty 1

## 2018-04-11 MED ORDER — ALBUTEROL SULFATE (2.5 MG/3ML) 0.083% IN NEBU
5.0000 mg | INHALATION_SOLUTION | Freq: Once | RESPIRATORY_TRACT | Status: AC
  Administered 2018-04-11: 5 mg via RESPIRATORY_TRACT
  Filled 2018-04-11: qty 6

## 2018-04-11 MED ORDER — LORAZEPAM 2 MG/ML IJ SOLN
1.0000 mg | Freq: Once | INTRAMUSCULAR | Status: AC
  Administered 2018-04-11: 1 mg via INTRAVENOUS
  Filled 2018-04-11: qty 1

## 2018-04-11 MED ORDER — SODIUM CHLORIDE (PF) 0.9 % IJ SOLN
INTRAMUSCULAR | Status: AC
  Filled 2018-04-11: qty 50

## 2018-04-11 MED ORDER — LIDOCAINE 5 % EX PTCH
1.0000 | MEDICATED_PATCH | CUTANEOUS | Status: DC
  Administered 2018-04-12: 1 via TRANSDERMAL
  Filled 2018-04-11 (×2): qty 1

## 2018-04-11 MED ORDER — FOLIC ACID 1 MG PO TABS
1.0000 mg | ORAL_TABLET | Freq: Every day | ORAL | Status: DC
  Administered 2018-04-11 – 2018-04-12 (×2): 1 mg via ORAL
  Filled 2018-04-11 (×2): qty 1

## 2018-04-11 MED ORDER — LORAZEPAM 2 MG/ML IJ SOLN: 1.0000 mg | Freq: Four times a day (QID) | INTRAMUSCULAR | Status: DC | PRN

## 2018-04-11 MED ORDER — LORAZEPAM 1 MG PO TABS
1.0000 mg | ORAL_TABLET | Freq: Four times a day (QID) | ORAL | Status: DC | PRN
  Administered 2018-04-12: 1 mg via ORAL
  Filled 2018-04-11 (×2): qty 1

## 2018-04-11 MED ORDER — SODIUM CHLORIDE 0.9 % IV SOLN
1.0000 g | Freq: Once | INTRAVENOUS | Status: AC
  Administered 2018-04-11: 1 g via INTRAVENOUS
  Filled 2018-04-11: qty 10

## 2018-04-11 MED ORDER — VITAMIN B-1 100 MG PO TABS
100.0000 mg | ORAL_TABLET | Freq: Every day | ORAL | Status: DC
  Administered 2018-04-11 – 2018-04-12 (×2): 100 mg via ORAL
  Filled 2018-04-11 (×2): qty 1

## 2018-04-11 MED ORDER — LORAZEPAM 2 MG/ML IJ SOLN
0.0000 mg | Freq: Four times a day (QID) | INTRAMUSCULAR | Status: DC
  Administered 2018-04-11: 1 mg via INTRAVENOUS
  Filled 2018-04-11: qty 1

## 2018-04-11 MED ORDER — THIAMINE HCL 100 MG/ML IJ SOLN
100.0000 mg | Freq: Every day | INTRAMUSCULAR | Status: DC
  Administered 2018-04-11: 100 mg via INTRAVENOUS
  Filled 2018-04-11: qty 2

## 2018-04-11 MED ORDER — GABAPENTIN 100 MG PO CAPS
200.0000 mg | ORAL_CAPSULE | Freq: Three times a day (TID) | ORAL | Status: DC
  Administered 2018-04-11 – 2018-04-12 (×2): 200 mg via ORAL
  Filled 2018-04-11 (×2): qty 2

## 2018-04-11 MED ORDER — SODIUM CHLORIDE 0.9 % IV SOLN
INTRAVENOUS | Status: DC | PRN
  Administered 2018-04-11: 500 mL via INTRAVENOUS

## 2018-04-11 MED ORDER — FUROSEMIDE 10 MG/ML IJ SOLN
40.0000 mg | Freq: Once | INTRAMUSCULAR | Status: AC
  Administered 2018-04-11: 40 mg via INTRAVENOUS
  Filled 2018-04-11: qty 4

## 2018-04-11 MED ORDER — FLUOXETINE HCL 20 MG PO CAPS
20.0000 mg | ORAL_CAPSULE | Freq: Every day | ORAL | Status: DC
  Administered 2018-04-11 – 2018-04-12 (×2): 20 mg via ORAL
  Filled 2018-04-11 (×2): qty 1

## 2018-04-11 MED ORDER — ACETAMINOPHEN 325 MG PO TABS
650.0000 mg | ORAL_TABLET | Freq: Four times a day (QID) | ORAL | Status: DC | PRN
  Administered 2018-04-11 – 2018-04-12 (×2): 650 mg via ORAL
  Filled 2018-04-11 (×2): qty 2

## 2018-04-11 MED ORDER — LORAZEPAM 2 MG/ML IJ SOLN: 0.0000 mg | Freq: Two times a day (BID) | INTRAMUSCULAR | Status: DC

## 2018-04-11 MED ORDER — IBUPROFEN 200 MG PO TABS
400.0000 mg | ORAL_TABLET | Freq: Once | ORAL | Status: AC
  Administered 2018-04-11: 400 mg via ORAL
  Filled 2018-04-11: qty 2

## 2018-04-11 MED ORDER — HYDROXYZINE HCL 25 MG PO TABS: 25.0000 mg | ORAL_TABLET | Freq: Every day | ORAL | Status: DC | PRN

## 2018-04-11 MED ORDER — PANTOPRAZOLE SODIUM 40 MG PO TBEC
40.0000 mg | DELAYED_RELEASE_TABLET | Freq: Every day | ORAL | Status: DC
  Administered 2018-04-12: 40 mg via ORAL
  Filled 2018-04-11: qty 1

## 2018-04-11 MED ORDER — HYDROCORTISONE 1 % EX CREA
1.0000 "application " | TOPICAL_CREAM | Freq: Three times a day (TID) | CUTANEOUS | Status: DC | PRN
  Filled 2018-04-11: qty 28

## 2018-04-11 MED ORDER — NICOTINE 21 MG/24HR TD PT24
21.0000 mg | MEDICATED_PATCH | Freq: Every day | TRANSDERMAL | Status: DC
  Administered 2018-04-11 – 2018-04-12 (×2): 21 mg via TRANSDERMAL
  Filled 2018-04-11 (×2): qty 1

## 2018-04-11 MED ORDER — IOHEXOL 350 MG/ML SOLN
100.0000 mL | Freq: Once | INTRAVENOUS | Status: AC | PRN
  Administered 2018-04-11: 100 mL via INTRAVENOUS

## 2018-04-11 MED ORDER — VITAMIN B-1 100 MG PO TABS: 100.0000 mg | ORAL_TABLET | Freq: Every day | ORAL | Status: DC

## 2018-04-11 MED ORDER — FUROSEMIDE 40 MG PO TABS
40.0000 mg | ORAL_TABLET | Freq: Two times a day (BID) | ORAL | Status: DC
  Administered 2018-04-11 – 2018-04-12 (×2): 40 mg via ORAL
  Filled 2018-04-11 (×2): qty 1

## 2018-04-11 MED ORDER — FOLIC ACID 1 MG PO TABS: 1.0000 mg | ORAL_TABLET | Freq: Every day | ORAL | Status: DC

## 2018-04-11 MED ORDER — LORAZEPAM 1 MG PO TABS
0.0000 mg | ORAL_TABLET | Freq: Four times a day (QID) | ORAL | Status: DC
  Administered 2018-04-11: 1 mg via ORAL

## 2018-04-11 NOTE — ED Notes (Signed)
ED Techs assisted pt into gown prior to transport upstairs

## 2018-04-11 NOTE — ED Notes (Signed)
Pt complained of burning anus

## 2018-04-11 NOTE — ED Notes (Signed)
Patient has called out multiple times requesting to see the MD and talk to the nurse. Pt repeatedly requesting tylenol and drinks. Pt in no active distress

## 2018-04-11 NOTE — ED Notes (Signed)
Pt ambulated independently from room to nurses station, appx 10 feet.  Pt not wearing O2 [place].  Pt asked to return to room, placed back on nasal cannula.

## 2018-04-11 NOTE — ED Notes (Signed)
Pt stating that she is going to leave, requesting paper scrubs, requesting wheelchair to leave.  MD made aware.

## 2018-04-11 NOTE — ED Notes (Signed)
MD Schlossman made aware of pts desire to leave.

## 2018-04-11 NOTE — ED Notes (Signed)
Pt observed to be without O2 [place] on.  Pt on sats 86% on room air at this time.  Pt sitting in chair, in own clothes, with purewick tubing dangling out of back of pants.  Pt placed back on [place] with O2 extension tubing.  O2 sats returned to 96% O2 by [place] 2L.  Pt reminded to leave O2  in place.

## 2018-04-11 NOTE — ED Notes (Signed)
ED TO INPATIENT HANDOFF REPORT  ED Nurse Name and Phone #: Tiant Peixoto, RN [phone]  S Name/Age/Gender Anne Black 58 y.o. female Room/Bed: WA20/WA20  Code Status   Code Status: Full Code  Home/SNF/Other Home Patient oriented to: x4 Is this baseline? Yes   Triage Complete: Triage complete  Chief Complaint d  Triage Note EMS reports from home chronic ETOH abuse, here yesterday for same. Pt admits drinking entire bottle of wine last night. Pt c/o abdominal pain and nausea.  CBG 115     Allergies No Known Allergies  Level of Care/Admitting Diagnosis ED Disposition    ED Disposition Condition Comment   Admit  Hospital Area: [facility] [[id number]]  Level of Care: Telemetry [5]  Admit to tele based on following criteria: Monitor for Ischemic changes  Admit to tele based on following criteria: Complex arrhythmia (Bradycardia/Tachycardia)  Diagnosis: Chronic hypoxemic respiratory failure [facility]) [[id number]]  Admitting Physician: Rolly Salter [[id number]]  Attending Physician: Rolly Salter [[id number]]  PT Class (Do Not Modify): Observation [104]  PT Acc Code (Do Not Modify): Observation [10022]       B Medical/Surgery History Past Medical History:  Diagnosis Date  . ADHD (attention deficit hyperactivity disorder)   . Alcoholism (HCC)   . Anxiety   . Arthritis   . Ascites   . Bipolar disorder (HCC)   . Cirrhosis (HCC)   . COPD (chronic obstructive pulmonary disease) (HCC)   . Hypokalemia   . Migraine   . Multiple sclerosis (HCC)   . Narcolepsy   . Osteoporosis    Past Surgical History:  Procedure Laterality Date  . ABDOMINAL WALL DEFECT REPAIR N/A 12/30/2016   Procedure: EXPLORATORY LAPAROTOMY WITH REPAIR ABDOMINAL WALL VENTRAL HERNIA;  Surgeon: [name], MD;  Location: [facility] OR;  Service: General;  Laterality: N/A;  . APPLICATION OF WOUND VAC N/A 12/30/2016   Procedure: APPLICATION OF WOUND VAC;  Surgeon: [name], MD;   Location: [facility];  Service: General;  Laterality: N/A;  . CESAREAN SECTION  [id number]  . FRACTURE SURGERY    . HERNIA REPAIR    . MYRINGOTOMY WITH TUBE PLACEMENT Bilateral   . ORIF WRIST FRACTURE Left 09/09/2016   Procedure: OPEN REDUCTION INTERNAL FIXATION (ORIF) LEFT WRIST FRACTURE, LEFT CARPAL TUNNEL RELEASE;  Surgeon: Dominica Severin, MD;  Location: [facility] ORS;  Service: Orthopedics;  Laterality: Left;  . TONSILLECTOMY       A IV Location/Drains/Wounds Patient Lines/Drains/Airways Status   Active Line/Drains/Airways    Name:   Placement date:   Placement time:   Site:   Days:   Peripheral IV 04/11/18 Left Hand   04/11/18    0858    Hand   less than 1   Peripheral IV 04/11/18 Left Antecubital   04/11/18    1228    Antecubital   less than 1   Incision (Closed) 04/15/17 Abdomen   04/15/17    0900     361   Wound / Incision (Open or Dehisced) 08/22/17 Non-pressure wound Abdomen Mid wound from prior hernia repair   08/22/17    0127    Abdomen   232          Intake/Output Last 24 hours  Intake/Output Summary (Last 24 hours) at 04/11/2018 1943 Last data filed at 04/11/2018 1512 Gross per 24 hour  Intake 108.81 ml  Output -  Net 108.81 ml    Labs/Imaging Results for orders placed or performed during the hospital encounter of 04/11/18 (  from the past 48 hour(s))  Comprehensive metabolic panel     Status: Abnormal   Collection Time: 04/11/18  8:58 AM  Result Value Ref Range   Sodium 137 135 - 145 mmol/L   Potassium 3.4 (L) 3.5 - 5.1 mmol/L   Chloride 99 98 - 111 mmol/L   CO2 29 22 - 32 mmol/L   Glucose, Bld 96 70 - 99 mg/dL   BUN 8 6 - 20 mg/dL   Creatinine, Ser 1.610.43 (L) 0.44 - 1.00 mg/dL   Calcium 8.2 (L) 8.9 - 10.3 mg/dL   Total Protein 6.3 (L) 6.5 - 8.1 g/dL   Albumin 3.5 3.5 - 5.0 g/dL   AST 26 15 - 41 U/L   ALT 15 0 - 44 U/L   Alkaline Phosphatase 92 38 - 126 U/L   Total Bilirubin 1.2 0.3 - 1.2 mg/dL   GFR calc non Af Amer >60 >60 mL/min   GFR calc Af Amer >60 >60 mL/min    Anion gap 9 5 - 15    Comment: Performed at [facility], [address] [place], [place] [address]  Ethanol     Status: None   Collection Time: 04/11/18  8:58 AM  Result Value Ref Range   Alcohol, Ethyl (B) <10 <10 mg/dL    Comment: (NOTE) Lowest detectable limit for serum alcohol is 10 mg/dL. For medical purposes only. Performed at [facility], [address] [place], [place] [address]   Lipase, blood     Status: None   Collection Time: 04/11/18  8:58 AM  Result Value Ref Range   Lipase 30 11 - 51 U/L    Comment: Performed at [facility], [address] [place], [place] [address]  CBC with Differential     Status: Abnormal   Collection Time: 04/11/18  8:58 AM  Result Value Ref Range   WBC 4.2 4.0 - 10.5 K/uL   RBC 3.89 3.87 - 5.11 MIL/uL   Hemoglobin 9.3 (L) 12.0 - 15.0 g/dL   HCT 78.233.8 (L) 95.636.0 - 21.346.0 %   MCV 86.9 80.0 - 100.0 fL   MCH 23.9 (L) 26.0 - 34.0 pg   MCHC 27.5 (L) 30.0 - 36.0 g/dL   RDW 08.619.6 (H) 57.811.5 - 46.915.5 %   Platelets 165 150 - 400 K/uL   nRBC 0.0 0.0 - 0.2 %   Neutrophils Relative % 74 %   Neutro Abs 3.1 1.7 - 7.7 K/uL   Lymphocytes Relative 14 %   Lymphs Abs 0.6 (L) 0.7 - 4.0 K/uL   Monocytes Relative 9 %   Monocytes Absolute 0.4 0.1 - 1.0 K/uL   Eosinophils Relative 2 %   Eosinophils Absolute 0.1 0.0 - 0.5 K/uL   Basophils Relative 1 %   Basophils Absolute 0.0 0.0 - 0.1 K/uL   Immature Granulocytes 0 %   Abs Immature Granulocytes 0.01 0.00 - 0.07 K/uL    Comment: Performed at [facility], [address] [place], [place] [address]  Brain natriuretic peptide     Status: Abnormal   Collection Time: 04/11/18  8:58 AM  Result Value Ref Range   B Natriuretic Peptide 388.7 (H) 0.0 - 100.0 pg/mL    Comment: Performed at [facility], [address] [place], [place] [address]  Urinalysis, Routine w reflex microscopic     Status: Abnormal   Collection Time:  04/11/18 11:46 AM  Result Value Ref Range   Color, Urine  YELLOW YELLOW   APPearance HAZY (A) CLEAR   Specific Gravity, Urine 1.015 1.005 - 1.030   pH 7.0 5.0 - 8.0   Glucose, UA NEGATIVE NEGATIVE mg/dL   Hgb urine dipstick NEGATIVE NEGATIVE   Bilirubin Urine NEGATIVE NEGATIVE   Ketones, ur 5 (A) NEGATIVE mg/dL   Protein, ur NEGATIVE NEGATIVE mg/dL   Nitrite POSITIVE (A) NEGATIVE   Leukocytes,Ua TRACE (A) NEGATIVE   RBC / HPF 0-5 0 - 5 RBC/hpf   WBC, UA 11-20 0 - 5 WBC/hpf   Bacteria, UA MANY (A) NONE SEEN   Squamous Epithelial / LPF 0-5 0 - 5    Comment: Performed at [facility], [address], [place], [place] [address]  Urine rapid drug screen (hosp performed)     Status: Abnormal   Collection Time: 04/11/18 11:46 AM  Result Value Ref Range   Opiates NONE DETECTED NONE DETECTED   Cocaine NONE DETECTED NONE DETECTED   Benzodiazepines POSITIVE (A) NONE DETECTED   Amphetamines NONE DETECTED NONE DETECTED   Tetrahydrocannabinol NONE DETECTED NONE DETECTED   Barbiturates NONE DETECTED NONE DETECTED    Comment: (NOTE) DRUG SCREEN FOR MEDICAL PURPOSES ONLY.  IF CONFIRMATION IS NEEDED FOR ANY PURPOSE, NOTIFY LAB WITHIN 5 DAYS. LOWEST DETECTABLE LIMITS FOR URINE DRUG SCREEN Drug Class                     Cutoff (ng/mL) Amphetamine and metabolites    1000 Barbiturate and metabolites    200 Benzodiazepine                 200 Tricyclics and metabolites     300 Opiates and metabolites        300 Cocaine and metabolites        300 THC                            50 Performed at [facility], [address], [place], [place] [address]   Blood gas, arterial     Status: Abnormal   Collection Time: 04/11/18 12:02 PM  Result Value Ref Range   O2 Content 2.5 L/min   Delivery systems NASAL CANNULA    pH, Arterial 7.402 7.350 - 7.450   pCO2 arterial 53.5 (H) 32.0 - 48.0 mmHg   pO2, Arterial 132 (H) 83.0 - 108.0 mmHg   Bicarbonate 32.6 (H) 20.0 -  28.0 mmol/L   Acid-Base Excess 7.3 (H) 0.0 - 2.0 mmol/L   O2 Saturation 99.2 %   Patient temperature 98.6    Collection site BRACHIAL ARTERY    Drawn by [id number]    Sample type ARTERIAL DRAW    Allens test (pass/fail) PASS PASS    Comment: Performed at [facility], [address], [place], [place] [address]  Ammonia     Status: None   Collection Time: 04/11/18 12:28 PM  Result Value Ref Range   Ammonia 12 9 - 35 umol/L    Comment: Performed at [facility], [address] [place], [place] [address]   Dg Chest 2 View  Result Date: 04/11/2018 CLINICAL DATA:  58 year old female with history of alcohol abuse presenting with abdominal pain and nausea. EXAM: CHEST - 2 VIEW COMPARISON:  CT the abdomen and pelvis 04/09/2018. FINDINGS: Lung volumes are low. No consolidative airspace disease. No pleural effusions. No suspicious appearing pulmonary nodules or masses. No pneumothorax. No evidence of pulmonary  edema. Heart size is mildly enlarged. Upper mediastinal contours are within normal limits. IMPRESSION: 1. No radiographic evidence of acute cardiopulmonary disease. 2. Mild cardiomegaly. Electronically Signed   By: Trudie Reed M.D.   On: 04/11/2018 09:39   Ct Angio Chest Pe W And/or Wo Contrast  Result Date: 04/11/2018 CLINICAL DATA:  Difficulty breathing EXAM: CT ANGIOGRAPHY CHEST WITH CONTRAST TECHNIQUE: Multidetector CT imaging of the chest was performed using the standard protocol during bolus administration of intravenous contrast. Multiplanar CT image reconstructions and MIPs were obtained to evaluate the vascular anatomy. CONTRAST:  OMNIPAQUE IOHEXOL 350 MG/ML SOLN COMPARISON:  02/28/2018 FINDINGS: Cardiovascular: Thoracic aorta demonstrates a normal branching pattern. No aneurysmal dilatation or dissection is seen. Mild cardiac enlargement is noted. Pulmonary artery shows a normal branching pattern without intraluminal filling defect to suggest  pulmonary embolism. Mediastinum/Nodes: Thoracic inlet is within normal limits. No sizable hilar or mediastinal adenopathy is noted. The esophagus as visualized is within normal limits. Lungs/Pleura: The lungs are well aerated bilaterally. No focal infiltrate or sizable effusion is seen. No parenchymal nodules are noted. Upper Abdomen: Cirrhotic change of the liver is noted with evidence of perihepatic ascites slightly increased when compared with the prior exam. Previously seen varices are again identified but not opacified due to the timing of the contrast bolus. The remainder of the upper abdomen is within normal limits. Musculoskeletal: Bilateral breast implants are noted. Degenerative changes of the thoracic spine are seen. Healed rib fractures are noted bilaterally. Review of the MIP images confirms the above findings. IMPRESSION: No evidence of pulmonary emboli. Cirrhotic change of the liver with slight increase in ascites when compared with the prior exam. Electronically Signed   By: Alcide Clever M.D.   On: 04/11/2018 14:15    Pending Labs Unresulted Labs (From admission, onward)    Start     Ordered   04/12/18 0500  Comprehensive metabolic panel  Tomorrow morning,   R     04/11/18 1923   04/12/18 0500  CBC  Tomorrow morning,   R     04/11/18 1923   04/11/18 1206  Urine culture  ONCE - STAT,   STAT     04/11/18 1205          Vitals/Pain Today's Vitals   04/11/18 1622 04/11/18 1624 04/11/18 1625 04/11/18 1759  BP: (!) 126/58   (!) 115/59  Pulse:  88 89 95  Resp:    18  Temp:      TempSrc:      SpO2:  100% 100% 91%  PainSc:        Isolation Precautions No active isolations  Medications Medications  0.9 %  sodium chloride infusion ( Intravenous Stopped 04/11/18 1512)  sodium chloride (PF) 0.9 % injection (has no administration in time range)  albuterol (PROVENTIL) (2.5 MG/3ML) 0.083% nebulizer solution 2.5 mg (has no administration in time range)  SUMAtriptan (IMITREX) tablet  50 mg (has no administration in time range)  promethazine (PHENERGAN) tablet 25 mg (has no administration in time range)  pantoprazole (PROTONIX) EC tablet 40 mg (has no administration in time range)  nicotine (NICODERM CQ - dosed in mg/24 hours) patch 21 mg (has no administration in time range)  lidocaine (LIDODERM) 5 % 1 patch (has no administration in time range)  hydrOXYzine (ATARAX/VISTARIL) tablet 25 mg (has no administration in time range)  gabapentin (NEURONTIN) capsule 200 mg (has no administration in time range)  furosemide (LASIX) tablet 40 mg (has no administration in time  range)  FLUoxetine (PROZAC) capsule 20 mg (has no administration in time range)  enoxaparin (LOVENOX) injection 40 mg (has no administration in time range)  acetaminophen (TYLENOL) tablet 650 mg (has no administration in time range)    Or  acetaminophen (TYLENOL) suppository 650 mg (has no administration in time range)  LORazepam (ATIVAN) tablet 1 mg (has no administration in time range)    Or  LORazepam (ATIVAN) injection 1 mg (has no administration in time range)  thiamine (VITAMIN B-1) tablet 100 mg (has no administration in time range)    Or  thiamine (B-1) injection 100 mg (has no administration in time range)  folic acid (FOLVITE) tablet 1 mg (has no administration in time range)  multivitamin with minerals tablet 1 tablet (has no administration in time range)  LORazepam (ATIVAN) tablet 0-4 mg (has no administration in time range)    Followed by  LORazepam (ATIVAN) tablet 0-4 mg (has no administration in time range)  albuterol (PROVENTIL) (2.5 MG/3ML) 0.083% nebulizer solution 5 mg (5 mg Nebulization Given 04/11/18 1147)  cefTRIAXone (ROCEPHIN) 1 g in sodium chloride 0.9 % 100 mL IVPB (0 g Intravenous Stopped 04/11/18 1350)  iohexol (OMNIPAQUE) 350 MG/ML injection 100 mL (100 mLs Intravenous Contrast Given 04/11/18 1330)  furosemide (LASIX) injection 40 mg (40 mg Intravenous Given 04/11/18 1507)  LORazepam  (ATIVAN) injection 1 mg (1 mg Intravenous Given 04/11/18 1506)  ibuprofen (ADVIL,MOTRIN) tablet 400 mg (400 mg Oral Given 04/11/18 1512)  loperamide (IMODIUM) capsule 2 mg (2 mg Oral Given 04/11/18 1627)    Mobility walks     Focused Assessments Pulmonary Assessment Handoff:  Lung sounds: Bilateral Breath Sounds: Diminished O2 Device: Nasal Cannula O2 Flow Rate (L/min): 2.5 L/min      R Recommendations: See Admitting Provider Note  Report given to:   Additional Notes: Earlier Charity fundraiser and staff have entered many notes on this pt.

## 2018-04-11 NOTE — ED Notes (Signed)
Pt had diarrhea episode

## 2018-04-11 NOTE — H&P (Signed)
[facility] History and Physical   Patient: Anne Black:[id number]   PCP: System, Pcp Not In DOB: 14-Mar-1960   DOA: 04/11/2018   DOS: 04/11/2018   DOS: the patient was seen and examined on 04/11/2018  Patient coming from: The patient is coming from home.  Chief Complaint: shortnes of breath   HPI: Anne Black is a 58 y.o. female with Past medical history of chronic alcoholism, cirrhosis, bipolar disorder, COPD, ADHD, depression, paroxysmal atrial fibrillation. The patient presented with complaints of shortness of breath. She called EMS today because she was having hard time breathing. Patient was recently hospitalized on 03/23/2018 charge on 03/28/2018. At that time of the discharge patient was provided oxygen and home oxygen was also arranged prior to discharge. Patient reports that "it is gone".  She does not know which agency she is supposed to contact for oxygen nor she remembers what happened with her prior oxygen tank that she was sent home with. At the time of my evaluation after receiving oxygen patient denied any complaints of chest pain or abdominal pain no nausea no vomiting no fever no chills. She mentions that she is out of some of her medication.  She does not remember the exact name of all the medications that she is out of. Continues to drink alcohol.  No recent falls reported after the restart.  ED Course: Presents with severe hypoxia. Saturations were in 60s. Patient was placed on oxygen.  ABG was confirming hypoxia.  CT PE protocol was performed which was negative for any PE or any other acute abnormality. When I informed the EDP that the patient actually is on chronic oxygen Case management was consulted for arranging the oxygen but her chronicity of the oxygen was not verifiable and therefore patient was referred for admission.  At her baseline ambulates with support And is independent for most of her ADL; manages her medication on her own.  Review of  Systems: as mentioned in the history of present illness.  All other systems reviewed and are negative.  Past Medical History:  Diagnosis Date  . ADHD (attention deficit hyperactivity disorder)   . Alcoholism (HCC)   . Anxiety   . Arthritis   . Ascites   . Bipolar disorder (HCC)   . Cirrhosis (HCC)   . COPD (chronic obstructive pulmonary disease) (HCC)   . Hypokalemia   . Migraine   . Multiple sclerosis (HCC)   . Narcolepsy   . Osteoporosis    Past Surgical History:  Procedure Laterality Date  . ABDOMINAL WALL DEFECT REPAIR N/A 12/30/2016   Procedure: EXPLORATORY LAPAROTOMY WITH REPAIR ABDOMINAL WALL VENTRAL HERNIA;  Surgeon: [name], MD;  Location: [facility] OR;  Service: General;  Laterality: N/A;  . APPLICATION OF WOUND VAC N/A 12/30/2016   Procedure: APPLICATION OF WOUND VAC;  Surgeon: [name], MD;  Location: [facility] OR;  Service: General;  Laterality: N/A;  . CESAREAN SECTION  [phone]  . FRACTURE SURGERY    . HERNIA REPAIR    . MYRINGOTOMY WITH TUBE PLACEMENT Bilateral   . ORIF WRIST FRACTURE Left 09/09/2016   Procedure: OPEN REDUCTION INTERNAL FIXATION (ORIF) LEFT WRIST FRACTURE, LEFT CARPAL TUNNEL RELEASE;  Surgeon: Dominica Severin, MD;  Location: [facility] ORS;  Service: Orthopedics;  Laterality: Left;  . TONSILLECTOMY     Social History:  reports that she has quit smoking. Her smoking use included cigarettes. She has a 35.00 pack-year smoking history. She has never used smokeless tobacco. She reports current alcohol use.  She reports that she does not use drugs.  No Known Allergies  Family History  Problem Relation Age of Onset  . Arrhythmia Mother   . Heart disease Father   . Hypertension Father      Prior to Admission medications   Medication Sig Start Date End Date Taking? Authorizing Provider  acetaminophen (TYLENOL) 500 MG tablet Take 1 tablet (500 mg total) by mouth every 6 (six) hours as needed for mild pain. Patient taking differently: Take 1,000 mg  by mouth every 6 (six) hours as needed for mild pain.  03/28/18  Yes Rolly Salter, MD  aluminum-magnesium hydroxide-simethicone (MAALOX) 200-200-20 MG/5ML SUSP Take 30 mLs by mouth 4 (four) times daily as needed (indigestion).    Yes [provider]  furosemide (LASIX) 40 MG tablet Take 40 mg twice daily till 04/02/2018, after that take 40 mg daily. 03/28/18  Yes Rolly Salter, MD  gabapentin (NEURONTIN) 100 MG capsule Take 2 capsules (200 mg total) by mouth 3 (three) times daily. 02/28/18  Yes Charlynne Pander, MD  ondansetron (ZOFRAN-ODT) 4 MG disintegrating tablet Take 4 mg by mouth every 8 (eight) hours as needed for nausea or vomiting.   Yes [provider]  pantoprazole (PROTONIX) 40 MG tablet Take 1 tablet (40 mg total) by mouth daily. 03/16/18  Yes Curatolo, Adam, DO  rizatriptan (MAXALT) 10 MG tablet Take 10 mg by mouth as needed for migraine. May repeat in 2 hours if needed   Yes [provider]  VENTOLIN HFA 108 (90 Base) MCG/ACT inhaler Inhale 2 puffs into the lungs every 4 (four) hours as needed for wheezing or shortness of breath. 03/16/18  Yes Curatolo, Adam, DO  chlordiazePOXIDE (LIBRIUM) 25 MG capsule Take 1 capsule (25 mg total) by mouth 3 (three) times daily as needed for anxiety or withdrawal. 04/03/18   Cathren Laine, MD  diltiazem (CARDIZEM CD) 120 MG 24 hr capsule Take 1 capsule (120 mg total) by mouth daily. Patient not taking: Reported on 04/03/2018 03/29/18   Rolly Salter, MD  FLUoxetine (PROZAC) 20 MG capsule Take 1 capsule (20 mg total) by mouth daily. 03/28/18   Rolly Salter, MD  folic acid (FOLVITE) 1 MG tablet Take 1 tablet (1 mg total) by mouth daily. Patient not taking: Reported on 04/03/2018 03/17/18   Clydie Braun, MD  hydrocortisone cream 1 % Apply topically 3 (three) times daily. Patient not taking: Reported on 04/03/2018 03/28/18   Rolly Salter, MD  hydrOXYzine (ATARAX/VISTARIL) 25 MG tablet Take 1 tablet (25 mg total) by mouth  daily as needed (anxiety). 03/17/18   Clydie Braun, MD  lactulose (CHRONULAC) 10 GM/15ML solution Take 45 mLs (30 g total) by mouth 3 (three) times daily. Patient not taking: Reported on 04/11/2018 03/28/18   Rolly Salter, MD  lidocaine (LIDODERM) 5 % Place 1 patch onto the skin daily. Remove & Discard patch within 12 hours or as directed by MD 04/09/18   Nicanor Alcon, April, MD  nicotine (NICODERM CQ - DOSED IN MG/24 HOURS) 21 mg/24hr patch Place 1 patch (21 mg total) onto the skin daily. 03/29/18   Rolly Salter, MD  predniSONE (DELTASONE) 20 MG tablet 3 tabs po day one, then 2 po daily x 4 days 04/09/18   Nicanor Alcon, April, MD  promethazine (PHENERGAN) 25 MG tablet Take 1 tablet (25 mg total) by mouth every 6 (six) hours as needed for nausea or vomiting. 03/17/18   Clydie Braun, MD  spironolactone (  ALDACTONE) 25 MG tablet Take 1 tablet (25 mg total) by mouth daily for 30 days. Patient not taking: Reported on 04/11/2018 03/28/18 04/27/18  Rolly Salter, MD  thiamine 100 MG tablet Take 1 tablet (100 mg total) by mouth daily. Patient not taking: Reported on 04/11/2018 03/28/18   Rolly Salter, MD    Physical Exam: Vitals:   04/11/18 1622 04/11/18 1624 04/11/18 1625 04/11/18 1759  BP: (!) 126/58   (!) 115/59  Pulse:  88 89 95  Resp:    18  Temp:      TempSrc:      SpO2:  100% 100% 91%    General: Alert, Awake and Oriented to Time, Place and Person. Appear in mild distress, affect appropriate Eyes: PERRL, Conjunctiva normal ENT: Oral Mucosa clear moist. Neck: no JVD, no Abnormal Mass Or lumps Cardiovascular: S1 and S2 Present, no Murmur, Peripheral Pulses Present Respiratory: normal respiratory effort, Bilateral Air entry equal and Decreased, no use of accessory muscle, Clear to Auscultation, no Crackles, no wheezes Abdomen: Bowel Sound present, Soft and no tenderness, no hernia Skin: no redness, no Rash, no induration Extremities: bilateral  Pedal edema, no calf tenderness Neurologic: Grossly  no focal neuro deficit. Bilaterally Equal motor strength  Labs on Admission:  CBC: Recent Labs  Lab 04/09/18 0318 04/09/18 0330 04/11/18 0858  WBC 3.3*  --  4.2  NEUTROABS 2.0  --  3.1  HGB 9.3* 10.2* 9.3*  HCT 32.2* 30.0* 33.8*  MCV 83.6  --  86.9  PLT 174  --  165   Basic Metabolic Panel: Recent Labs  Lab 04/09/18 0318 04/09/18 0330 04/11/18 0858  NA 142 143 137  K 3.5 3.5 3.4*  CL 105  --  99  CO2 29  --  29  GLUCOSE 101*  --  96  BUN <5*  --  8  CREATININE 0.37*  --  0.43*  CALCIUM 8.1*  --  8.2*   GFR: Estimated Creatinine Clearance: 77.6 mL/min (A) (by C-G formula based on SCr of 0.43 mg/dL (L)). Liver Function Tests: Recent Labs  Lab 04/11/18 0858  AST 26  ALT 15  ALKPHOS 92  BILITOT 1.2  PROT 6.3*  ALBUMIN 3.5   Recent Labs  Lab 04/11/18 0858  LIPASE 30   Recent Labs  Lab 04/11/18 1228  AMMONIA 12   Coagulation Profile: No results for input(s): INR, PROTIME in the last 168 hours. Cardiac Enzymes: No results for input(s): CKTOTAL, CKMB, CKMBINDEX, TROPONINI in the last 168 hours. BNP (last 3 results) No results for input(s): PROBNP in the last 8760 hours. HbA1C: No results for input(s): HGBA1C in the last 72 hours. CBG: No results for input(s): GLUCAP in the last 168 hours. Lipid Profile: No results for input(s): CHOL, HDL, LDLCALC, TRIG, CHOLHDL, LDLDIRECT in the last 72 hours. Thyroid Function Tests: No results for input(s): TSH, T4TOTAL, FREET4, T3FREE, THYROIDAB in the last 72 hours. Anemia Panel: No results for input(s): VITAMINB12, FOLATE, FERRITIN, TIBC, IRON, RETICCTPCT in the last 72 hours. Urine analysis:    Component Value Date/Time   COLORURINE YELLOW 04/11/2018 1146   APPEARANCEUR HAZY (A) 04/11/2018 1146   LABSPEC 1.015 04/11/2018 1146   PHURINE 7.0 04/11/2018 1146   GLUCOSEU NEGATIVE 04/11/2018 1146   HGBUR NEGATIVE 04/11/2018 1146   BILIRUBINUR NEGATIVE 04/11/2018 1146   BILIRUBINUR moderate (A) 06/19/2015 1906    KETONESUR 5 (A) 04/11/2018 1146   PROTEINUR NEGATIVE 04/11/2018 1146   UROBILINOGEN >=8.0 06/19/2015 1906  UROBILINOGEN 0.2 09/14/2011 2141   NITRITE POSITIVE (A) 04/11/2018 1146   LEUKOCYTESUR TRACE (A) 04/11/2018 1146    Radiological Exams on Admission: Dg Chest 2 View  Result Date: 04/11/2018 CLINICAL DATA:  58 year old female with history of alcohol abuse presenting with abdominal pain and nausea. EXAM: CHEST - 2 VIEW COMPARISON:  CT the abdomen and pelvis 04/09/2018. FINDINGS: Lung volumes are low. No consolidative airspace disease. No pleural effusions. No suspicious appearing pulmonary nodules or masses. No pneumothorax. No evidence of pulmonary edema. Heart size is mildly enlarged. Upper mediastinal contours are within normal limits. IMPRESSION: 1. No radiographic evidence of acute cardiopulmonary disease. 2. Mild cardiomegaly. Electronically Signed   By: Trudie Reed M.D.   On: 04/11/2018 09:39   Ct Angio Chest Pe W And/or Wo Contrast  Result Date: 04/11/2018 CLINICAL DATA:  Difficulty breathing EXAM: CT ANGIOGRAPHY CHEST WITH CONTRAST TECHNIQUE: Multidetector CT imaging of the chest was performed using the standard protocol during bolus administration of intravenous contrast. Multiplanar CT image reconstructions and MIPs were obtained to evaluate the vascular anatomy. CONTRAST:  OMNIPAQUE IOHEXOL 350 MG/ML SOLN COMPARISON:  02/28/2018 FINDINGS: Cardiovascular: Thoracic aorta demonstrates a normal branching pattern. No aneurysmal dilatation or dissection is seen. Mild cardiac enlargement is noted. Pulmonary artery shows a normal branching pattern without intraluminal filling defect to suggest pulmonary embolism. Mediastinum/Nodes: Thoracic inlet is within normal limits. No sizable hilar or mediastinal adenopathy is noted. The esophagus as visualized is within normal limits. Lungs/Pleura: The lungs are well aerated bilaterally. No focal infiltrate or sizable effusion is seen. No  parenchymal nodules are noted. Upper Abdomen: Cirrhotic change of the liver is noted with evidence of perihepatic ascites slightly increased when compared with the prior exam. Previously seen varices are again identified but not opacified due to the timing of the contrast bolus. The remainder of the upper abdomen is within normal limits. Musculoskeletal: Bilateral breast implants are noted. Degenerative changes of the thoracic spine are seen. Healed rib fractures are noted bilaterally. Review of the MIP images confirms the above findings. IMPRESSION: No evidence of pulmonary emboli. Cirrhotic change of the liver with slight increase in ascites when compared with the prior exam. Electronically Signed   By: Alcide Clever M.D.   On: 04/11/2018 14:15    Assessment/Plan Chronic hypoxic respiratory failure. COPD. Chronic diastolic CHF. Patient is on 2 L of oxygen based on her recent evaluation in the hospital. Patient was actually provided oxygen at the time of the discharge. Currently remains on 2 L of oxygen and drops to 60 to 80% when she is off of the oxygen. Suspect that the patient is also noncompliant with her diuretic at home. At present patient will be admitted the hospital.  Already received IV Lasix. We will monitor on telemetry. Recheck home oxygen evaluation. Case management consult for further assist with oxygenation as well as home health.  Paroxysmal atrial fibrillation, now sinus Patient was started on Cardizem during last admission for her A. fib. Currently blood pressure is actually soft and therefore I will be holding off on Cardizem.  Alcohol use disorder,inacute withdrawal here  Continue CIWA protocol Continue multivitamin, folate, thiamine  Cirrhosis due to alcoholicliver disease Acute on chronic diastolic CHF Continue Aldactone and Lasix.  Already received IV Lasix in the ER.  With improvement diuresis  continue lactulose as well.  Bipolar disorder,  depression Continue home Prozac  COPD, no exacerbation currently -Continue home fluticasone/salmeterol, duo nebs, Ventolin as needed  GERD -Continue PPI  Social issues.  Patient's primary issue is alcoholism. Patient has frequent ER visits secondary to complications from chronic alcoholism.  Recently psychiatry evaluated the patient and patient does have capacity to make medical decision.   Nutrition: cardiac diet DVT Prophylaxis: subcutaneous Heparin  Advance goals of care discussion: full code   Consults: none  Family Communication: no family was present at bedside, at the time of interview.  Disposition: Admitted as observation, telemetry unit. Likely to be discharged home, in 1 days.  Author: Lynden Oxford, MD [facility] 04/11/2018  To reach On-call, see care teams to locate the attending and reach out to them via [email or web address]. If 7PM-7AM, please contact night-coverage If you still have difficulty reaching the attending provider, please page the [facility] (Director on Call) for [facility] on amion for assistance.

## 2018-04-11 NOTE — ED Notes (Signed)
Pt provided sugar free ginger ale, per MD request.

## 2018-04-11 NOTE — Progress Notes (Signed)
I introduced myself to patient and explained I am the admitting nurse and had came to do her history for upstairs. She immediately started screaming at me "I am not be admitted." I told her that is fine and left patient's room. Briscoe Burns BSN, RN-BC Admissions RN 04/11/2018 4:12 PM

## 2018-04-11 NOTE — ED Notes (Signed)
Pt put pulse-ox back on finger and noticed it was 91. In response she said that she did not need to be here and requested some paper scrubs.

## 2018-04-11 NOTE — ED Notes (Signed)
Pt replaced on 2 lt O2 due to hypoxia

## 2018-04-11 NOTE — ED Notes (Signed)
Pt on room air with 91, had removed canula.

## 2018-04-11 NOTE — ED Notes (Signed)
Pt requests an uber and/or voucher

## 2018-04-11 NOTE — ED Notes (Signed)
Pt repeatedly asking for drinks, food, treatment for UTI.  This RN, along with other staff, have informed pt that she is receiving antibiotics as part of her treatment.  NT has provided multiple drinks for pt, pt continues to call for more.  Pt needs reminding as to current treatments, only seems focused on food and drink. Will continue to monitor.

## 2018-04-11 NOTE — ED Notes (Signed)
Pt requests help from social worker do deal with clogged toilets at home

## 2018-04-11 NOTE — ED Provider Notes (Signed)
[facility]-EMERGENCY DEPT Provider Note   CSN: [id number] Arrival date & time: 04/11/18  0818   History   Chief Complaint Chief Complaint  Patient presents with  . Alcohol Intoxication    HPI Anne Black is a 58 y.o. female.     HPI  58 year old female presents with abdominal pain.  History is limited as she is acutely intoxicated.  She states that she last drank last night and drank 3 or 4 glasses of wine.  She drinks every day.  She is also having diffuse abdominal pain that she states she normally has with drinking.  She has had nausea without vomiting.  She also has ascites and states that she recently had a paracentesis but she came over which day.  However does not feel like she needs another one.  No chest pain. Falls asleep while talking to her/examining her. When asked, she endorses dyspnea. States this is chronic and unchanged. No cough. She states she was up all night worried because her cell phone was broke and didn't sleep. Now is very tired.  Past Medical History:  Diagnosis Date  . ADHD (attention deficit hyperactivity disorder)   . Alcoholism (HCC)   . Anxiety   . Arthritis   . Ascites   . Bipolar disorder (HCC)   . Cirrhosis (HCC)   . COPD (chronic obstructive pulmonary disease) (HCC)   . Hypokalemia   . Migraine   . Multiple sclerosis (HCC)   . Narcolepsy   . Osteoporosis     Patient Active Problem List   Diagnosis Date Noted  . Atrial fibrillation with rapid ventricular response (HCC) 03/23/2018  . Head injury   . Fall 03/19/2018  . Syncope 03/19/2018  . Orbital fracture 03/19/2018  . Scalp laceration 03/19/2018  . Acute respiratory failure with hypoxia and hypercarbia (HCC) 03/19/2018  . Abdominal tenderness 03/19/2018  . Acute on chronic respiratory failure with hypoxia (HCC) 03/17/2018  . Alcohol abuse with intoxication (HCC) 03/17/2018  . ETOH abuse   . Normocytic anemia 03/16/2018  . Acute metabolic encephalopathy  03/16/2018  . Lower leg edema 03/16/2018  . Migraine 03/16/2018  . A-fib (HCC) 02/18/2018  . Community acquired pneumonia of left lower lobe of lung (HCC)   . Weakness 08/21/2017  . COPD exacerbation (HCC) 06/12/2017  . Chest pain with moderate risk for cardiac etiology 05/30/2017  . Abnormal LFTs 05/30/2017  . Tachycardia 05/30/2017  . COPD (chronic obstructive pulmonary disease) (HCC) 04/19/2017  . Right rib fracture 04/15/2017  . Abdominal wall cellulitis 12/30/2016  . S/P exploratory laparotomy 12/30/2016  . Distal radius fracture, left 09/09/2016  . Thrombocytopenia (HCC) 09/09/2016  . Chest x-ray abnormality   . Umbilical hernia without obstruction and without gangrene 05/12/2016  . Itching 02/24/2016  . Ventral hernia without obstruction or gangrene 02/24/2016  . Palliative care encounter   . Ascites   . Tachypnea   . Atrial flutter with rapid ventricular response (HCC)   . Hypomagnesemia   . Hypoxia   . Dehydration 01/22/2016  . Low back ache 12/30/2015  . Non-intractable vomiting with nausea   . Alcohol abuse with alcohol-induced mood disorder (HCC) 12/15/2015  . Acute alcoholism (HCC)   . Abdominal distension 11/04/2015  . Alcohol abuse   . Dyspnea   . Acute respiratory failure (HCC) 10/15/2015  . Ascites due to alcoholic cirrhosis (HCC) 10/15/2015  . Osteoporosis   . Multiple sclerosis (HCC)   . Alcoholic cirrhosis of liver with ascites (HCC) 08/22/2015  .  Bipolar disorder, current episode mixed, moderate (HCC) 08/01/2015  . Alcohol use disorder, severe, dependence (HCC) 07/31/2015  . Macrocytic anemia- due to alcohol abuse with normal B12 & folate levels 05/31/2015  . Severe protein-calorie malnutrition (HCC) 05/31/2015  . Hypokalemia 05/26/2015  . Encephalopathy, hepatic (HCC) 05/26/2015  . Alcohol withdrawal (HCC) 05/18/2015  . Abdominal pain 05/18/2015  . Anxiety 05/18/2015  . Severe sepsis (HCC) 05/18/2015  . UTI (lower urinary tract infection)  05/18/2015  . Stimulant abuse (HCC) 12/27/2014  . Nicotine abuse 12/27/2014  . Hyperprolactinemia (HCC) 11/15/2014  . Dyslipidemia   . Alcohol use disorder, moderate, dependence (HCC) 10/30/2014  . Tobacco abuse 01/23/2014  . Bipolar 1 disorder (HCC) 04/07/2013  . Noncompliance with therapeutic plan 04/04/2013  . Hereditary and idiopathic peripheral neuropathy 05/01/2012  . Depression 05/01/2012  . GERD (gastroesophageal reflux disease) 05/01/2012  . Osteoarthrosis, unspecified whether generalized or localized, involving lower leg 05/01/2012  . Hyponatremia 07/01/2011    Past Surgical History:  Procedure Laterality Date  . ABDOMINAL WALL DEFECT REPAIR N/A 12/30/2016   Procedure: EXPLORATORY LAPAROTOMY WITH REPAIR ABDOMINAL WALL VENTRAL HERNIA;  Surgeon: [name], MD;  Location: [facility] OR;  Service: General;  Laterality: N/A;  . APPLICATION OF WOUND VAC N/A 12/30/2016   Procedure: APPLICATION OF WOUND VAC;  Surgeon: [name], MD;  Location: [facility] OR;  Service: General;  Laterality: N/A;  . CESAREAN SECTION  [phone]  . FRACTURE SURGERY    . HERNIA REPAIR    . MYRINGOTOMY WITH TUBE PLACEMENT Bilateral   . ORIF WRIST FRACTURE Left 09/09/2016   Procedure: OPEN REDUCTION INTERNAL FIXATION (ORIF) LEFT WRIST FRACTURE, LEFT CARPAL TUNNEL RELEASE;  Surgeon: Dominica Severin, MD;  Location: [facility] ORS;  Service: Orthopedics;  Laterality: Left;  . TONSILLECTOMY       OB History   No obstetric history on file.      Home Medications    Prior to Admission medications   Medication Sig Start Date End Date Taking? Authorizing Provider  acetaminophen (TYLENOL) 500 MG tablet Take 1 tablet (500 mg total) by mouth every 6 (six) hours as needed for mild pain. Patient taking differently: Take 1,000 mg by mouth every 6 (six) hours as needed for mild pain.  03/28/18  Yes Rolly Salter, MD  aluminum-magnesium hydroxide-simethicone (MAALOX) 200-200-20 MG/5ML SUSP Take 30 mLs by mouth 4 (four)  times daily as needed (indigestion).    Yes [provider]  furosemide (LASIX) 40 MG tablet Take 40 mg twice daily till 04/02/2018, after that take 40 mg daily. 03/28/18  Yes Rolly Salter, MD  gabapentin (NEURONTIN) 100 MG capsule Take 2 capsules (200 mg total) by mouth 3 (three) times daily. 02/28/18  Yes Charlynne Pander, MD  ondansetron (ZOFRAN-ODT) 4 MG disintegrating tablet Take 4 mg by mouth every 8 (eight) hours as needed for nausea or vomiting.   Yes [provider]  pantoprazole (PROTONIX) 40 MG tablet Take 1 tablet (40 mg total) by mouth daily. 03/16/18  Yes Curatolo, Adam, DO  rizatriptan (MAXALT) 10 MG tablet Take 10 mg by mouth as needed for migraine. May repeat in 2 hours if needed   Yes [provider]  VENTOLIN HFA 108 (90 Base) MCG/ACT inhaler Inhale 2 puffs into the lungs every 4 (four) hours as needed for wheezing or shortness of breath. 03/16/18  Yes Curatolo, Adam, DO  chlordiazePOXIDE (LIBRIUM) 25 MG capsule Take 1 capsule (25 mg total) by mouth 3 (three) times daily as needed for anxiety or  withdrawal. 04/03/18   Cathren Laine, MD  diltiazem (CARDIZEM CD) 120 MG 24 hr capsule Take 1 capsule (120 mg total) by mouth daily. Patient not taking: Reported on 04/03/2018 03/29/18   Rolly Salter, MD  FLUoxetine (PROZAC) 20 MG capsule Take 1 capsule (20 mg total) by mouth daily. 03/28/18   Rolly Salter, MD  folic acid (FOLVITE) 1 MG tablet Take 1 tablet (1 mg total) by mouth daily. Patient not taking: Reported on 04/03/2018 03/17/18   Clydie Braun, MD  hydrocortisone cream 1 % Apply topically 3 (three) times daily. Patient not taking: Reported on 04/03/2018 03/28/18   Rolly Salter, MD  hydrOXYzine (ATARAX/VISTARIL) 25 MG tablet Take 1 tablet (25 mg total) by mouth daily as needed (anxiety). 03/17/18   Clydie Braun, MD  lactulose (CHRONULAC) 10 GM/15ML solution Take 45 mLs (30 g total) by mouth 3 (three) times daily. Patient not taking: Reported on  04/11/2018 03/28/18   Rolly Salter, MD  lidocaine (LIDODERM) 5 % Place 1 patch onto the skin daily. Remove & Discard patch within 12 hours or as directed by MD 04/09/18   Nicanor Alcon, April, MD  nicotine (NICODERM CQ - DOSED IN MG/24 HOURS) 21 mg/24hr patch Place 1 patch (21 mg total) onto the skin daily. 03/29/18   Rolly Salter, MD  predniSONE (DELTASONE) 20 MG tablet 3 tabs po day one, then 2 po daily x 4 days 04/09/18   Nicanor Alcon, April, MD  promethazine (PHENERGAN) 25 MG tablet Take 1 tablet (25 mg total) by mouth every 6 (six) hours as needed for nausea or vomiting. 03/17/18   Clydie Braun, MD  spironolactone (ALDACTONE) 25 MG tablet Take 1 tablet (25 mg total) by mouth daily for 30 days. Patient not taking: Reported on 04/11/2018 03/28/18 04/27/18  Rolly Salter, MD  thiamine 100 MG tablet Take 1 tablet (100 mg total) by mouth daily. Patient not taking: Reported on 04/11/2018 03/28/18   Rolly Salter, MD    Family History Family History  Problem Relation Age of Onset  . Arrhythmia Mother   . Heart disease Father   . Hypertension Father     Social History Social History   Tobacco Use  . Smoking status: Former Smoker    Packs/day: 1.00    Years: 35.00    Pack years: 35.00    Types: Cigarettes  . Smokeless tobacco: Never Used  Substance Use Topics  . Alcohol use: Yes    Comment: 1 bottle of wine per day per patient.   . Drug use: No     Allergies   Patient has no known allergies.   Review of Systems Review of Systems  Constitutional: Negative for fever.  Respiratory: Positive for shortness of breath. Negative for cough.   Cardiovascular: Negative for chest pain.  Gastrointestinal: Positive for abdominal pain and nausea. Negative for vomiting.  All other systems reviewed and are negative.    Physical Exam Updated Vital Signs BP (!) 126/58   Pulse 89   Temp 98.1 F (36.7 C) (Oral)   Resp 18   SpO2 100%   Physical Exam Vitals signs and nursing note reviewed.    Constitutional:      General: She is not in acute distress.    Appearance: She is well-developed. She is not ill-appearing or diaphoretic.     Comments: Patient falls asleep during history and exam.  HENT:     Head: Normocephalic and atraumatic.     Right  Ear: External ear normal.     Left Ear: External ear normal.     Nose: Nose normal.  Eyes:     General:        Right eye: No discharge.        Left eye: No discharge.  Cardiovascular:     Rate and Rhythm: Regular rhythm. Tachycardia present.     Heart sounds: Normal heart sounds.  Pulmonary:     Effort: Pulmonary effort is normal.     Breath sounds: Rales present.     Comments: Mild basilar rales, unclear if poor effort or pathologic Abdominal:     General: There is distension.     Palpations: Abdomen is soft.     Tenderness: There is abdominal tenderness.     Comments: Mild distention but no tautness or signs of significant fluid overload.  There is some generalized tenderness but no focal tenderness.  Skin:    General: Skin is warm and dry.  Neurological:     Mental Status: She is alert.  Psychiatric:        Mood and Affect: Mood is not anxious.      ED Treatments / Results  Labs (all labs ordered are listed, but only abnormal results are displayed) Labs Reviewed  COMPREHENSIVE METABOLIC PANEL - Abnormal; Notable for the following components:      Result Value   Potassium 3.4 (*)    Creatinine, Ser 0.43 (*)    Calcium 8.2 (*)    Total Protein 6.3 (*)    All other components within normal limits  CBC WITH DIFFERENTIAL/PLATELET - Abnormal; Notable for the following components:   Hemoglobin 9.3 (*)    HCT 33.8 (*)    MCH 23.9 (*)    MCHC 27.5 (*)    RDW 19.6 (*)    Lymphs Abs 0.6 (*)    All other components within normal limits  BRAIN NATRIURETIC PEPTIDE - Abnormal; Notable for the following components:   B Natriuretic Peptide 388.7 (*)    All other components within normal limits  URINALYSIS, ROUTINE W  REFLEX MICROSCOPIC - Abnormal; Notable for the following components:   APPearance HAZY (*)    Ketones, ur 5 (*)    Nitrite POSITIVE (*)    Leukocytes,Ua TRACE (*)    Bacteria, UA MANY (*)    All other components within normal limits  RAPID URINE DRUG SCREEN, HOSP PERFORMED - Abnormal; Notable for the following components:   Benzodiazepines POSITIVE (*)    All other components within normal limits  BLOOD GAS, ARTERIAL - Abnormal; Notable for the following components:   pCO2 arterial 53.5 (*)    pO2, Arterial 132 (*)    Bicarbonate 32.6 (*)    Acid-Base Excess 7.3 (*)    All other components within normal limits  URINE CULTURE  ETHANOL  LIPASE, BLOOD  AMMONIA  CBG MONITORING, ED    EKG None ED ECG REPORT   Date: 04/11/2018  Rate: 97  Rhythm: normal sinus rhythm  QRS Axis: normal  Intervals: normal  ST/T Wave abnormalities: nonspecific T wave changes  Conduction Disutrbances:none  Narrative Interpretation:   Old EKG Reviewed: unchanged  I have personally reviewed the EKG tracing and agree with the computerized printout as noted.  Radiology Dg Chest 2 View  Result Date: 04/11/2018 CLINICAL DATA:  58 year old female with history of alcohol abuse presenting with abdominal pain and nausea. EXAM: CHEST - 2 VIEW COMPARISON:  CT the abdomen and pelvis 04/09/2018. FINDINGS: Lung volumes  are low. No consolidative airspace disease. No pleural effusions. No suspicious appearing pulmonary nodules or masses. No pneumothorax. No evidence of pulmonary edema. Heart size is mildly enlarged. Upper mediastinal contours are within normal limits. IMPRESSION: 1. No radiographic evidence of acute cardiopulmonary disease. 2. Mild cardiomegaly. Electronically Signed   By: Trudie Reed M.D.   On: 04/11/2018 09:39   Ct Angio Chest Pe W And/or Wo Contrast  Result Date: 04/11/2018 CLINICAL DATA:  Difficulty breathing EXAM: CT ANGIOGRAPHY CHEST WITH CONTRAST TECHNIQUE: Multidetector CT imaging of the  chest was performed using the standard protocol during bolus administration of intravenous contrast. Multiplanar CT image reconstructions and MIPs were obtained to evaluate the vascular anatomy. CONTRAST:  OMNIPAQUE IOHEXOL 350 MG/ML SOLN COMPARISON:  02/28/2018 FINDINGS: Cardiovascular: Thoracic aorta demonstrates a normal branching pattern. No aneurysmal dilatation or dissection is seen. Mild cardiac enlargement is noted. Pulmonary artery shows a normal branching pattern without intraluminal filling defect to suggest pulmonary embolism. Mediastinum/Nodes: Thoracic inlet is within normal limits. No sizable hilar or mediastinal adenopathy is noted. The esophagus as visualized is within normal limits. Lungs/Pleura: The lungs are well aerated bilaterally. No focal infiltrate or sizable effusion is seen. No parenchymal nodules are noted. Upper Abdomen: Cirrhotic change of the liver is noted with evidence of perihepatic ascites slightly increased when compared with the prior exam. Previously seen varices are again identified but not opacified due to the timing of the contrast bolus. The remainder of the upper abdomen is within normal limits. Musculoskeletal: Bilateral breast implants are noted. Degenerative changes of the thoracic spine are seen. Healed rib fractures are noted bilaterally. Review of the MIP images confirms the above findings. IMPRESSION: No evidence of pulmonary emboli. Cirrhotic change of the liver with slight increase in ascites when compared with the prior exam. Electronically Signed   By: Alcide Clever M.D.   On: 04/11/2018 14:15    Procedures Procedures (including critical care time)  Medications Ordered in ED Medications  0.9 %  sodium chloride infusion ( Intravenous Stopped 04/11/18 1512)  sodium chloride (PF) 0.9 % injection (has no administration in time range)  LORazepam (ATIVAN) injection 0-4 mg (1 mg Intravenous Given 04/11/18 1624)    Or  LORazepam (ATIVAN) tablet 0-4 mg ( Oral  See Alternative 04/11/18 1624)  LORazepam (ATIVAN) injection 0-4 mg (has no administration in time range)    Or  LORazepam (ATIVAN) tablet 0-4 mg (has no administration in time range)  thiamine (VITAMIN B-1) tablet 100 mg ( Oral See Alternative 04/11/18 1631)    Or  thiamine (B-1) injection 100 mg (100 mg Intravenous Given 04/11/18 1631)  albuterol (PROVENTIL) (2.5 MG/3ML) 0.083% nebulizer solution 5 mg (5 mg Nebulization Given 04/11/18 1147)  cefTRIAXone (ROCEPHIN) 1 g in sodium chloride 0.9 % 100 mL IVPB (0 g Intravenous Stopped 04/11/18 1350)  iohexol (OMNIPAQUE) 350 MG/ML injection 100 mL (100 mLs Intravenous Contrast Given 04/11/18 1330)  furosemide (LASIX) injection 40 mg (40 mg Intravenous Given 04/11/18 1507)  LORazepam (ATIVAN) injection 1 mg (1 mg Intravenous Given 04/11/18 1506)  ibuprofen (ADVIL,MOTRIN) tablet 400 mg (400 mg Oral Given 04/11/18 1512)  loperamide (IMODIUM) capsule 2 mg (2 mg Oral Given 04/11/18 1627)     Initial Impression / Assessment and Plan / ED Course  I have reviewed the triage vital signs and the nursing notes.  Pertinent labs & imaging results that were available during my care of the patient were reviewed by me and considered in my medical decision making (see chart  for details).  Clinical Course as of Apr 10 1636  Tue Apr 11, 2018  1052 I took patient off oxygen as she is not on oxygen at home.  On recheck, her O2 sats are 69%.  She is quite sleepy which is likely partially contributing but I will get an ABG to help rule out hypercapnic respiratory failure. If negative will get CT for further hypoxia workup. Given COPD, will try breathing treatment, though no wheezing at this time.   [SG]    Clinical Course User Index [SG] Pricilla LovelessGoldston, Tove Wideman, MD       The patient is quite complicated.  Originally, she was quite sleepy, and this was attributed to alcohol abuse.  However her EtOH level is negative.  She was found to be severely hypoxic when I took her off oxygen.  This  came up with rest through the nasal cannula.  Thus work-up for this hypoxia obtained and is essentially negative with no respiratory acidosis, pulmonary embolism, severe pulmonary edema or pneumonia.  She is not particularly wheezing.  I discussed with the hospitalist, Dr. Allena KatzPatel, to admit for this as she does not have oxygen at home but he states that she does not need to be admitted and asked for case management to be consulted.  I called case management and they are checking as the patient used to have home oxygen when discharged in her last admission but apparently the health service agency took her oxygen away.  She is noted to have a UTI now that she is more awake and alert, she is endorsing urinary symptoms.  I had originally planned on a CT head given the sleepiness and possible altered mental status but she is much more awake now and I think she truly was sleep deprived.  She has also noted a little bit of diarrhea here and was given Imodium.  Otherwise her labs are near baseline.  Case management has called me back and states that [facility] and thus they are unable to get her outpatient home oxygen from the ED.  I discussed again with Dr. Allena KatzPatel who wants to talk to case management again.  Care transferred to Dr. Dalene SeltzerSchlossman.  Final Clinical Impressions(s) / ED Diagnoses   Final diagnoses:  None    ED Discharge Orders    None       Pricilla LovelessGoldston, Maliik Karner, MD 04/11/18 1640

## 2018-04-11 NOTE — ED Notes (Signed)
Pt voided her bowels on bedpan and then proceeded to eat sandwich

## 2018-04-11 NOTE — ED Notes (Signed)
Bed: [facility] Expected date: 04/11/18 Expected time: 8:18 AM Means of arrival: Ambulance Comments: EMS-ETOH

## 2018-04-11 NOTE — ED Notes (Signed)
PT stated that she has MS and cannot walk.

## 2018-04-11 NOTE — ED Triage Notes (Signed)
EMS reports from home chronic ETOH abuse, here yesterday for same. Pt admits drinking entire bottle of wine last night. Pt c/o abdominal pain and nausea.  CBG 115

## 2018-04-11 NOTE — ED Notes (Signed)
Pt walking around with purewick still between legs requesting pants. Ambulating without O2.

## 2018-04-11 NOTE — ED Notes (Signed)
This RN entered room to find pt undressed, without O2 nasal cannula on.  Pt O2 saturations were 81% on room air, pt visibly with labored breathing.  This RN informed pt that it is highly important that she wear O2 by nasal cannula.  Pt stating "I dont need it, theres no evidence." This RN then reconnected pt to monitor and pulse oximetry and pointed at screen showing pts O2 sat at 80%.  Pt verbalized understanding that she is to remain wearing O2 saturations, assisted back into the bed. Will continue to monitor.

## 2018-04-12 ENCOUNTER — Other Ambulatory Visit: Payer: Self-pay

## 2018-04-12 DIAGNOSIS — Z9119 Patient's noncompliance with other medical treatment and regimen: Secondary | ICD-10-CM | POA: Diagnosis not present

## 2018-04-12 DIAGNOSIS — K7030 Alcoholic cirrhosis of liver without ascites: Secondary | ICD-10-CM | POA: Diagnosis not present

## 2018-04-12 DIAGNOSIS — J9611 Chronic respiratory failure with hypoxia: Secondary | ICD-10-CM | POA: Diagnosis not present

## 2018-04-12 LAB — COMPREHENSIVE METABOLIC PANEL
ALBUMIN: 3.3 g/dL — AB (ref 3.5–5.0)
ALT: 12 U/L (ref 0–44)
AST: 22 U/L (ref 15–41)
Alkaline Phosphatase: 82 U/L (ref 38–126)
Anion gap: 10 (ref 5–15)
BUN: 9 mg/dL (ref 6–20)
CO2: 34 mmol/L — ABNORMAL HIGH (ref 22–32)
Calcium: 8.2 mg/dL — ABNORMAL LOW (ref 8.9–10.3)
Chloride: 95 mmol/L — ABNORMAL LOW (ref 98–111)
Creatinine, Ser: 0.53 mg/dL (ref 0.44–1.00)
GFR calc Af Amer: >60 mL/min (ref >60)
GFR calc non Af Amer: >60 mL/min (ref >60)
Glucose, Bld: 95 mg/dL (ref 70–99)
Potassium: 3 mmol/L — ABNORMAL LOW (ref 3.5–5.1)
Sodium: 139 mmol/L (ref 135–145)
Total Bilirubin: 1.4 mg/dL — ABNORMAL HIGH (ref 0.3–1.2)
Total Protein: 6 g/dL — ABNORMAL LOW (ref 6.5–8.1)

## 2018-04-12 LAB — CBC
HCT: 30.8 % — ABNORMAL LOW (ref 36.0–46.0)
Hemoglobin: 8.6 g/dL — ABNORMAL LOW (ref 12.0–15.0)
MCH: 24.1 pg — AB (ref 26.0–34.0)
MCHC: 27.9 g/dL — ABNORMAL LOW (ref 30.0–36.0)
MCV: 86.3 fL (ref 80.0–100.0)
Platelets: 128 10*3/uL — ABNORMAL LOW (ref 150–400)
RBC: 3.57 MIL/uL — ABNORMAL LOW (ref 3.87–5.11)
RDW: 19.5 % — ABNORMAL HIGH (ref 11.5–15.5)
WBC: 3.1 10*3/uL — ABNORMAL LOW (ref 4.0–10.5)
nRBC: 0 % (ref 0.0–0.2)

## 2018-04-12 MED ORDER — DILTIAZEM HCL ER COATED BEADS 120 MG PO CP24: 120.0000 mg | ORAL_CAPSULE | Freq: Every day | ORAL | 0 refills | Status: DC

## 2018-04-12 MED ORDER — SPIRONOLACTONE 25 MG PO TABS: 25.0000 mg | ORAL_TABLET | Freq: Every day | ORAL | 0 refills | Status: DC

## 2018-04-12 MED ORDER — LACTULOSE 10 GM/15ML PO SOLN: 30.0000 g | Freq: Three times a day (TID) | ORAL | 0 refills | Status: DC

## 2018-04-12 MED ORDER — LORAZEPAM 1 MG PO TABS: 0.0000 mg | ORAL_TABLET | Freq: Four times a day (QID) | ORAL | Status: DC

## 2018-04-12 MED ORDER — GABAPENTIN 100 MG PO CAPS: 200.0000 mg | ORAL_CAPSULE | Freq: Three times a day (TID) | ORAL | 0 refills | Status: DC

## 2018-04-12 MED ORDER — FOLIC ACID 1 MG PO TABS: 1.0000 mg | ORAL_TABLET | Freq: Every day | ORAL | 0 refills | Status: DC

## 2018-04-12 MED ORDER — HYDROXYZINE HCL 25 MG PO TABS: 25.0000 mg | ORAL_TABLET | Freq: Every day | ORAL | 0 refills | Status: DC | PRN

## 2018-04-12 MED ORDER — FLUOXETINE HCL 20 MG PO CAPS: 20.0000 mg | ORAL_CAPSULE | Freq: Every day | ORAL | 0 refills | Status: DC

## 2018-04-12 MED ORDER — NITROFURANTOIN MONOHYD MACRO 100 MG PO CAPS: 100.0000 mg | ORAL_CAPSULE | Freq: Two times a day (BID) | ORAL | 0 refills | Status: DC

## 2018-04-12 MED ORDER — CHLORDIAZEPOXIDE HCL 25 MG PO CAPS: ORAL_CAPSULE | ORAL | 0 refills | Status: DC

## 2018-04-12 MED ORDER — FUROSEMIDE 40 MG PO TABS: ORAL_TABLET | ORAL | 0 refills | Status: DC

## 2018-04-12 MED ORDER — PANTOPRAZOLE SODIUM 40 MG PO TBEC: 40.0000 mg | DELAYED_RELEASE_TABLET | Freq: Every day | ORAL | 0 refills | Status: DC

## 2018-04-12 MED ORDER — THIAMINE HCL 100 MG PO TABS: 100.0000 mg | ORAL_TABLET | Freq: Every day | ORAL | 0 refills | Status: DC

## 2018-04-12 MED ORDER — LORAZEPAM 1 MG PO TABS: 0.0000 mg | ORAL_TABLET | Freq: Two times a day (BID) | ORAL | Status: DC

## 2018-04-12 NOTE — Discharge Summary (Signed)
Physician Discharge Summary  Anne Black UJW:[id number] DOB: 05-09-1960 DOA: 04/11/2018  PCP: System, Pcp Not In  Admit date: 04/11/2018 Discharge date: 04/12/2018  Admitted From: Home  Disposition:  Home   Recommendations for Outpatient Follow-up:  1. Follow up with PCP in 1-2 weeks 2. Please obtain BMP/CBC in one week your next doctors visit.  3. Needs to follow up outpatient Psych within 1-2 weeks  4. Counseled to quit using alcohol 5. Advised to take her lactulose to titrate to about 2-3 bowel movements a day  Home Health: Attempted to make arrangements but due to patient's previous history of refusing care or allowing anyone in the house-at this time no health agencies willing to follow her. Equipment/Devices: None Discharge Condition: Stable CODE STATUS: Full code Diet recommendation: 2 g salt diet  Brief/Interim Summary: 58 year old with past medical history of cirrhosis due to alcohol use, current alcohol use, bipolar disorder, COPD, ADHD, depression, paroxysmal atrial fibrillation came to the hospital as she reported that she was having shortness of breath.  Patient was recently hospitalized for similar issues and was discharged home with home oxygen.  Since then she has not allowed home health agencies to visit her as she is known not to open her door or remain compliant with them.  She is also visited ER in between.  Upon hospitalization here she was noted to be slightly hypoxemic on room air.  ABG confirmed this.  CTA of the chest was negative for pulmonary embolism.  She was admitted to the hospital for bronchodilators and mild diuresis. Supporting staff in the hospital try to make arrangements for her at home but due to her noncompliance history our case manager was not able to have any home health agency to work with her.   Discharge Diagnoses:  Active Problems:   Chronic hypoxemic respiratory failure (HCC)  Acute on chronic hypoxic respiratory failure on 2 L nasal  cannula Chronic diastolic congestive heart failure, class II.  Improved COPD, stable - After receiving 1 dose of IV Lasix, patient is close to euvolemic.  Doing well on 2 L nasal cannula.  Case management to resume her home oxygen and arrangements to be made. - Advised to continue her home bronchodilators.  She needs to follow-up outpatient with pulmonary  Alcohol use, daily Alcoholic liver cirrhosis -Counseled to quit using alcohol.  Advised to continue using her home medications.  Patient tells me that her and her daughter are planning on going to [facility] tomorrow so she can get started alcohol rehabilitation.  History of bipolar disorder -Continue home meds.  Advised to follow-up outpatient with psychiatry.  Currently patient is not homicidal or suicidal.  She does show capacity to make her own decisions.  GERD -PPI  Medical and dietary noncompliance - Patient has been extensively educated to remain compliant with her medications and her diet.  I still have my concerns that patient will still remain noncompliant and go back to drinking alcohol and refused any extra home health care.  She continues to be a very high risk for readmission to the hospital due to her noncompliance issues.  Attempted to call patient's daughter but she did not answer. We will discharge the patient home today once we have a safe disposition planning.  Case has been discussed with the case management and Child psychotherapist.  Case is also been discussed with the pharmacist.  Consultations:  None  Subjective: Patient denies any complaints this morning.  Admits that she feels back to herself.  When  I questioned her why she refused home health agency when they previously visited, she gave elusive answers stating she did not think that would have helped her.  Discharge Exam: Vitals:   04/12/18 0600 04/12/18 1141  BP: (!) 105/50   Pulse: 89   Resp:    Temp: 98.7 F (37.1 C)   SpO2: 93% 98%   Vitals:    04/12/18 0252 04/12/18 0435 04/12/18 0600 04/12/18 1141  BP: 109/62 (!) 109/57 (!) 105/50   Pulse: 82 79 89   Resp:  14    Temp:  99 F (37.2 C) 98.7 F (37.1 C)   TempSrc:  Oral Oral   SpO2: 96% 96% 93% 98%  Weight:      Height:        General: Pt is alert, awake, not in acute distress Cardiovascular: RRR, S1/S2 +, no rubs, no gallops Respiratory: Slight bibasilar crackles otherwise clear to auscultation Abdominal: Soft, NT, ND, bowel sounds + Extremities: no edema, no cyanosis No suicidal or homicidal ideation Trace bilateral lower extremity pitting edema  Discharge Instructions   Allergies as of 04/12/2018   No Known Allergies     Medication List    TAKE these medications   acetaminophen 500 MG tablet Commonly known as:  TYLENOL Take 1 tablet (500 mg total) by mouth every 6 (six) hours as needed for mild pain. What changed:  how much to take   aluminum-magnesium hydroxide-simethicone 200-200-20 MG/5ML Susp Commonly known as:  MAALOX Take 30 mLs by mouth 4 (four) times daily as needed (indigestion).   chlordiazePOXIDE 25 MG capsule Commonly known as:  LIBRIUM Take 1 capsule (25 mg total) by mouth 3 (three) times daily as needed for anxiety or withdrawal.   diltiazem 120 MG 24 hr capsule Commonly known as:  CARDIZEM CD Take 1 capsule (120 mg total) by mouth daily. Notes to patient:  Ask for it to be [facility] at [facility].   FLUoxetine 20 MG capsule Commonly known as:  PROZAC Take 1 capsule (20 mg total) by mouth daily.   folic acid 1 MG tablet Commonly known as:  FOLVITE Take 1 tablet (1 mg total) by mouth daily.   furosemide 40 MG tablet Commonly known as:  LASIX Take 40 mg twice daily till 04/02/2018, after that take 40 mg daily.   gabapentin 100 MG capsule Commonly known as:  NEURONTIN Take 2 capsules (200 mg total) by mouth 3 (three) times daily.   hydrocortisone cream 1 % Apply topically 3 (three) times daily.   hydrOXYzine 25 MG tablet Commonly  known as:  ATARAX/VISTARIL Take 1 tablet (25 mg total) by mouth daily as needed (anxiety).   lactulose 10 GM/15ML solution Commonly known as:  CHRONULAC Take 45 mLs (30 g total) by mouth 3 (three) times daily.   lidocaine 5 % Commonly known as:  LIDODERM Place 1 patch onto the skin daily. Remove & Discard patch within 12 hours or as directed by MD   nicotine 21 mg/24hr patch Commonly known as:  NICODERM CQ - dosed in mg/24 hours Place 1 patch (21 mg total) onto the skin daily.   nitrofurantoin (macrocrystal-monohydrate) 100 MG capsule Commonly known as:  MACROBID Take 1 capsule (100 mg total) by mouth 2 (two) times daily for 5 days. Notes to patient:  Take until gone even if you start to feel better.    ondansetron 4 MG disintegrating tablet Commonly known as:  ZOFRAN-ODT Take 4 mg by mouth every 8 (eight) hours as needed for  nausea or vomiting.   pantoprazole 40 MG tablet Commonly known as:  PROTONIX Take 1 tablet (40 mg total) by mouth daily.   predniSONE 20 MG tablet Commonly known as:  DELTASONE 3 tabs po day one, then 2 po daily x 4 days   promethazine 25 MG tablet Commonly known as:  PHENERGAN Take 1 tablet (25 mg total) by mouth every 6 (six) hours as needed for nausea or vomiting.   rizatriptan 10 MG tablet Commonly known as:  MAXALT Take 10 mg by mouth as needed for migraine. May repeat in 2 hours if needed   spironolactone 25 MG tablet Commonly known as:  ALDACTONE Take 1 tablet (25 mg total) by mouth daily for 30 days.   thiamine 100 MG tablet Take 1 tablet (100 mg total) by mouth daily.   VENTOLIN HFA 108 (90 Base) MCG/ACT inhaler Generic drug:  albuterol Inhale 2 puffs into the lungs every 4 (four) hours as needed for wheezing or shortness of breath.   WIXELA INHUB 250-50 MCG/DOSE Aepb Generic drug:  Fluticasone-Salmeterol Inhale 1 puff into the lungs 2 (two) times daily.      Follow-up Information    [facility].  Schedule an appointment as soon as possible for a visit in 1 week(s).   Contact information: [address] [place] [address] [phone]         No Known Allergies  You were cared for by a hospitalist during your hospital stay. If you have any questions about your discharge medications or the care you received while you were in the hospital after you are discharged, you can call the unit and asked to speak with the hospitalist on call if the hospitalist that took care of you is not available. Once you are discharged, your primary care physician will handle any further medical issues. Please note that no refills for any discharge medications will be authorized once you are discharged, as it is imperative that you return to your primary care physician (or establish a relationship with a primary care physician if you do not have one) for your aftercare needs so that they can reassess your need for medications and monitor your lab values.   Procedures/Studies: Dg Chest 2 View  Result Date: 04/11/2018 CLINICAL DATA:  58 year old female with history of alcohol abuse presenting with abdominal pain and nausea. EXAM: CHEST - 2 VIEW COMPARISON:  CT the abdomen and pelvis 04/09/2018. FINDINGS: Lung volumes are low. No consolidative airspace disease. No pleural effusions. No suspicious appearing pulmonary nodules or masses. No pneumothorax. No evidence of pulmonary edema. Heart size is mildly enlarged. Upper mediastinal contours are within normal limits. IMPRESSION: 1. No radiographic evidence of acute cardiopulmonary disease. 2. Mild cardiomegaly. Electronically Signed   By: Trudie Reed M.D.   On: 04/11/2018 09:39   Dg Chest 2 View  Result Date: 03/19/2018 CLINICAL DATA:  Found on ground. EXAM: CHEST - 2 VIEW COMPARISON:  03/16/2018 FINDINGS: The heart is upper limits of normal in size given the AP projection and portable technique. No change since prior study. The lungs are  clear except for streaky basilar atelectasis. No pleural effusion or pneumothorax. The bony thorax is intact. IMPRESSION: Streaky basilar atelectasis but no infiltrates, edema or effusions. Electronically Signed   By: Rudie Meyer M.D.   On: 03/19/2018 05:45   Ct Head Wo Contrast  Result Date: 03/23/2018 CLINICAL DATA:  Altered mental status EXAM: CT HEAD WITHOUT CONTRAST TECHNIQUE: Contiguous axial  images were obtained from the base of the skull through the vertex without intravenous contrast. COMPARISON:  03/18/2018 FINDINGS: Brain: No evidence of acute infarction, hemorrhage, hydrocephalus, extra-axial collection or mass lesion/mass effect. Periventricular white matter hypodensity. Vascular: No hyperdense vessel or unexpected calcification. Skull: Normal. Negative for fracture or focal lesion. Sinuses/Orbits: Redemonstrated displaced fracture of the right orbital floor, better evaluated by recent dedicated CT examination of the facial bones. Other: None. IMPRESSION: 1. No acute intracranial pathology. Small-vessel white matter disease. 2. Redemonstrated displaced fracture of the right orbital floor, better evaluated by recent dedicated CT examination of the facial bones. Electronically Signed   By: Lauralyn Primes M.D.   On: 03/23/2018 16:41   Ct Head Wo Contrast  Result Date: 03/18/2018 CLINICAL DATA:  Syncopal episode. Posterior head laceration. Found down on floor. Multiple sclerosis. EXAM: CT HEAD WITHOUT CONTRAST TECHNIQUE: Contiguous axial images were obtained from the base of the skull through the vertex without intravenous contrast. COMPARISON:  03/16/2018 head CT. FINDINGS: Brain: Nonspecific moderate subcortical and periventricular white matter hypodensity, stable. No evidence of parenchymal hemorrhage or extra-axial fluid collection. No mass lesion, mass effect, or midline shift. No CT evidence of acute infarction. Cerebral volume is age appropriate. No ventriculomegaly. Vascular: No acute  abnormality. Skull: No evidence of calvarial fracture. Sinuses/Orbits: There is an acute fracture of right orbital floor with near complete opacification of the right maxillary sinus. No evidence of herniated right orbital muscle. Soft tissue emphysema is noted in. Septal inferior right orbit. Other: Small scalp laceration with associated minimal scalp emphysema in the medial left parietal scalp. The mastoid air cells are unopacified. IMPRESSION: 1. Small scalp laceration with associated minimal scalp emphysema in the medial left parietal scalp. 2. No evidence of acute intracranial abnormality. No evidence of calvarial fracture. 3. Acute fracture of the right orbital floor with near complete opacification of the right maxillary sinus. Soft tissue emphysema in the inferior right orbit. 4. Chronic moderate white matter low-attenuation foci compatible with the provided clinical history of multiple sclerosis. Electronically Signed   By: Delbert Phenix M.D.   On: 03/18/2018 23:34   Ct Head Wo Contrast  Result Date: 03/16/2018 CLINICAL DATA:  Altered level of consciousness. History of alcohol ingestion. Responsive to painful stimuli. EXAM: CT HEAD WITHOUT CONTRAST TECHNIQUE: Contiguous axial images were obtained from the base of the skull through the vertex without intravenous contrast. COMPARISON:  03/19/2017 FINDINGS: Motion artifact limits examination. Brain: Mild diffuse cerebral atrophy. Mild ventricular dilatation consistent with central atrophy. Patchy low-attenuation changes in the deep white matter likely due to small vessel ischemia. No mass-effect or midline shift. No abnormal extra-axial fluid collections. Gray-white matter junctions are distinct. Basal cisterns are not effaced. No acute intracranial hemorrhage. Vascular: No hyperdense vessel or unexpected calcification. Skull: Calvarium appears intact. No acute depressed skull fractures identified. Sinuses/Orbits: Visualized paranasal sinuses and mastoid air  cells are clear. Visualization is severely limited due to motion artifact. Other: None. IMPRESSION: No acute intracranial abnormalities. Chronic atrophy and small vessel ischemic changes. Electronically Signed   By: Burman Nieves M.D.   On: 03/16/2018 21:26   Ct Angio Chest Pe W And/or Wo Contrast  Result Date: 04/11/2018 CLINICAL DATA:  Difficulty breathing EXAM: CT ANGIOGRAPHY CHEST WITH CONTRAST TECHNIQUE: Multidetector CT imaging of the chest was performed using the standard protocol during bolus administration of intravenous contrast. Multiplanar CT image reconstructions and MIPs were obtained to evaluate the vascular anatomy. CONTRAST:  OMNIPAQUE IOHEXOL 350 MG/ML SOLN COMPARISON:  02/28/2018  FINDINGS: Cardiovascular: Thoracic aorta demonstrates a normal branching pattern. No aneurysmal dilatation or dissection is seen. Mild cardiac enlargement is noted. Pulmonary artery shows a normal branching pattern without intraluminal filling defect to suggest pulmonary embolism. Mediastinum/Nodes: Thoracic inlet is within normal limits. No sizable hilar or mediastinal adenopathy is noted. The esophagus as visualized is within normal limits. Lungs/Pleura: The lungs are well aerated bilaterally. No focal infiltrate or sizable effusion is seen. No parenchymal nodules are noted. Upper Abdomen: Cirrhotic change of the liver is noted with evidence of perihepatic ascites slightly increased when compared with the prior exam. Previously seen varices are again identified but not opacified due to the timing of the contrast bolus. The remainder of the upper abdomen is within normal limits. Musculoskeletal: Bilateral breast implants are noted. Degenerative changes of the thoracic spine are seen. Healed rib fractures are noted bilaterally. Review of the MIP images confirms the above findings. IMPRESSION: No evidence of pulmonary emboli. Cirrhotic change of the liver with slight increase in ascites when compared with the  prior exam. Electronically Signed   By: Alcide Clever M.D.   On: 04/11/2018 14:15   Ct Cervical Spine Wo Contrast  Result Date: 03/19/2018 CLINICAL DATA:  59 y/o F; found on floor with laceration to head and injury to right cheek bone. EXAM: CT MAXILLOFACIAL WITHOUT CONTRAST CT CERVICAL SPINE WITHOUT CONTRAST TECHNIQUE: Multidetector CT imaging of the maxillofacial structures was performed. Multiplanar CT image reconstructions were also generated. A small metallic BB was placed on the right temple in order to reliably differentiate right from left. Multidetector CT imaging of the cervical spine was performed without intravenous contrast. Multiplanar CT image reconstructions were also generated. COMPARISON:  03/18/2018 CT head. FINDINGS: CT MAXILLOFACIAL FINDINGS Osseous: Multiple levels of motion artifact through the face. Acute fracture of the right floor of orbit measuring 12 x 10 mm (AP by ML series 16, image 30 and series 15, image 29) with 6 mm inferior displacement into the right maxillary sinus. No additional displaced facial fracture or mandibular dislocation identified. Orbits: Small amount of emphysema is present within the right inferior palpebral as well as the extraconal fat of the right inferior orbit. No orbital or retro-orbital hematoma. No significant proptosis. Extraocular muscles are intact without findings of entrapment. Normal left orbit. Sinuses: Right maxillary sinus blood fluid level. Additional visible paranasal sinuses are normally aerated. Under pneumatization of the mastoid air cells. Pneumatized mastoid air cells are normally aerated. Soft tissues: Contusion of right facial and periorbital soft tissues. Limited intracranial: Please refer to same-day CT head. CT CERVICAL FINDINGS Alignment: Normal. Skull base and vertebrae: Motion artifact at the C2 and C6 levels. No acute fracture identified. No primary bone lesion or focal pathologic process. Soft tissues and spinal canal: No  prevertebral fluid or swelling. No visible canal hematoma. Disc levels: Mild multilevel discogenic degenerative changes with small endplate marginal osteophytes. No significant bony foraminal or spinal canal stenosis. Upper chest: Negative. Other: Negative. IMPRESSION: Motion degraded study. 1. Acute fracture of the right floor of orbit measuring up to 12 mm with 6 mm inferior displacement into the right maxillary sinus. Mild infraorbital extraconal pneumatosis. No orbital or retro-orbital hematoma. No significant proptosis. No muscle entrapment. 2. Right maxillary sinus blood fluid level. 3. No additional displaced fracture or dislocation of the facial bones. 4. Contusion of right facial and periorbital soft tissues. 5. No acute fracture or dislocation of the cervical spine. 6. Mild cervical spine degenerative changes. No significant bony foraminal or spinal canal  stenosis. Electronically Signed   By: Mitzi Hansen M.D.   On: 03/19/2018 00:38   US Abdomen Complete  Result Date: 03/19/2018 CLINICAL DATA:  Abdominal pain.  Abdominal hernia.  Cirrhosis. EXAM: ABDOMEN ULTRASOUND COMPLETE COMPARISON:  CT of the abdomen and pelvis on 02/28/2018 FINDINGS: Gallbladder: Gallbladder has a normal appearance. Gallbladder wall is 0.9 millimeters, within normal limits. No stones or pericholecystic fluid. Common bile duct: Diameter: 3.4 millimeters Liver: Heterogeneous echotexture. Nodular surface contour. Perihepatic ascites. No focal liver lesions are identified. Portal vein is patent on color Doppler imaging with normal direction of blood flow towards the liver. IVC: No abnormality visualized. Pancreas: Mildly heterogeneous without focal mass. Spleen: Somewhat nodular and enlarged.  No focal splenic lesion. Right Kidney: Length: 11.5 centimeters. Echogenicity within normal limits. No mass or hydronephrosis visualized. Left Kidney: Length: 11.7 centimeter. Echogenicity within normal limits. No mass or  hydronephrosis visualized. Abdominal aorta: Visualized portion is not aneurysmal, 2.1 centimeters. Distal aorta is obscured by bowel gas. Other findings: Ascites. IMPRESSION: 1. Ascites. 2. Cirrhotic morphology of the liver. No focal liver lesions identified sonographically. 3. Splenomegaly. 4. Positive sonographic Murphy sign without other ultrasound findings to suggest acute cholecystitis. Findings raise a question of acalculus cholecystitis. Electronically Signed   By: Norva Pavlov M.D.   On: 03/19/2018 08:09   US Paracentesis  Result Date: 03/19/2018 INDICATION: Patient with abdominal distention, ascites. Request is made for diagnostic and therapeutic paracentesis. EXAM: ULTRASOUND GUIDED DIAGNOSTIC AND THERAPEUTIC PARACENTESIS MEDICATIONS: 10 mL 1% lidocaine COMPLICATIONS: None immediate. PROCEDURE: Informed written consent was obtained from the patient after a discussion of the risks, benefits and alternatives to treatment. A timeout was performed prior to the initiation of the procedure. Initial ultrasound scanning demonstrates a small amount of ascites within the right lower abdominal quadrant. The right lower abdomen was prepped and draped in the usual sterile fashion. 1% lidocaine was used for local anesthesia. Following this, a 19 gauge, 7-cm, Yueh catheter was introduced. An ultrasound image was saved for documentation purposes. The paracentesis was performed. The catheter was removed and a dressing was applied. The patient tolerated the procedure well without immediate post procedural complication. FINDINGS: A total of approximately 2.0 L of patent, hazy fluid was removed. Samples were sent to the laboratory as requested by the clinical team. IMPRESSION: Successful ultrasound-guided diagnostic and therapeutic paracentesis yielding 2.0 liters of peritoneal fluid. Read by: Loyce Dys PA-C Electronically Signed   By: Gilmer Mor D.O.   On: 03/19/2018 14:48   Dg Chest Portable 1  View  Result Date: 04/09/2018 CLINICAL DATA:  Initial evaluation for COPD. EXAM: PORTABLE CHEST 1 VIEW COMPARISON:  Prior radiograph from 03/23/2018 FINDINGS: Patient is markedly rotated to the right. Cardiomegaly grossly stable. Mediastinal silhouette within normal limits. Aortic atherosclerosis. Lungs hypoinflated. Underlying changes related to COPD. No focal infiltrates. No superimposed edema or pleural effusion. No pneumothorax. No acute osseous finding. IMPRESSION: 1. COPD.  No other superimposed active cardiopulmonary disease. 2. Stable mild cardiomegaly with aortic atherosclerosis. Electronically Signed   By: Rise Mu M.D.   On: 04/09/2018 04:33   Dg Chest Portable 1 View  Result Date: 03/23/2018 CLINICAL DATA:  Patient fell last evening. Bruising of the right eye. Dyspnea. EXAM: PORTABLE CHEST 1 VIEW COMPARISON:  03/19/2018 FINDINGS: The cardiopericardial silhouette is mildly enlarged but stable. Mild aortic atherosclerosis is noted at the arch without aneurysm. Chronic bronchitic change of the lungs without alveolar consolidation effusion or pneumothorax. IMPRESSION: Chronic bronchitic change of the lungs. Mild cardiomegaly with  aortic atherosclerosis. Electronically Signed   By: Tollie Eth M.D.   On: 03/23/2018 16:53   Dg Chest Portable 1 View  Result Date: 03/16/2018 CLINICAL DATA:  Poorly responsive after alcohol intake. EXAM: PORTABLE CHEST 1 VIEW COMPARISON:  Chest radiograph February 28, 2018 FINDINGS: Stable cardiomegaly. Interstitial prominence with pulmonary vascular congestion. No pleural effusion or focal consolidation. No pneumothorax. Old LEFT rib fractures. Breast implants. IMPRESSION: Stable cardiomegaly and pulmonary vascular congestion. Interstitial prominence seen with atypical infection and/or pulmonary edema. Electronically Signed   By: Awilda Metro M.D.   On: 03/16/2018 18:25   Dg Knee Left Port  Result Date: 04/09/2018 CLINICAL DATA:  58 year old female with  left knee pain. EXAM: PORTABLE LEFT KNEE - 1-2 VIEW COMPARISON:  None. FINDINGS: There is no acute fracture or dislocation. Mild osteopenia. No joint effusion. There is diffuse subcutaneous soft tissue edema. IMPRESSION: No acute fracture or dislocation. Electronically Signed   By: Elgie Collard M.D.   On: 04/09/2018 04:34   Dg Hip Port Unilat W Or Wo Pelvis 1 View Left  Result Date: 04/09/2018 CLINICAL DATA:  58 year old female with left hip pain. EXAM: DG HIP (WITH OR WITHOUT PELVIS) 1V PORT LEFT COMPARISON:  None. FINDINGS: There is no acute fracture or dislocation. No significant arthritic changes. The bones are osteopenic. The soft tissues are grossly unremarkable. IMPRESSION: Negative. Electronically Signed   By: Elgie Collard M.D.   On: 04/09/2018 04:29   Vas [place] Lower Extremity Venous (dvt)  Result Date: 03/17/2018  Lower Venous Study Indications: Edema.  Performing Technologist: Levada Schilling RDMS, RVT  Examination Guidelines: A complete evaluation includes B-mode imaging, spectral Doppler, color Doppler, and power Doppler as needed of all accessible portions of each vessel. Bilateral testing is considered an integral part of a complete examination. Limited examinations for reoccurring indications may be performed as noted.  Right Venous Findings: +---------+---------------+---------+-----------+----------+-------+          CompressibilityPhasicitySpontaneityPropertiesSummary +---------+---------------+---------+-----------+----------+-------+ CFV      Full           Yes      Yes                          +---------+---------------+---------+-----------+----------+-------+ SFJ      Full                                                 +---------+---------------+---------+-----------+----------+-------+ FV Prox  Full                                                 +---------+---------------+---------+-----------+----------+-------+ FV Mid   Full                                                  +---------+---------------+---------+-----------+----------+-------+ FV DistalFull                                                 +---------+---------------+---------+-----------+----------+-------+ PFV  Full                                                 +---------+---------------+---------+-----------+----------+-------+ POP      Full           Yes      Yes                          +---------+---------------+---------+-----------+----------+-------+ PTV      Full                                                 +---------+---------------+---------+-----------+----------+-------+ PERO     Full                                                 +---------+---------------+---------+-----------+----------+-------+ GSV      Full                                                 +---------+---------------+---------+-----------+----------+-------+  Left Venous Findings: +---------+---------------+---------+-----------+----------+-------+          CompressibilityPhasicitySpontaneityPropertiesSummary +---------+---------------+---------+-----------+----------+-------+ CFV      Full           Yes      Yes                          +---------+---------------+---------+-----------+----------+-------+ SFJ      Full                                                 +---------+---------------+---------+-----------+----------+-------+ FV Prox  Full                                                 +---------+---------------+---------+-----------+----------+-------+ FV Mid   Full                                                 +---------+---------------+---------+-----------+----------+-------+ FV DistalFull                                                 +---------+---------------+---------+-----------+----------+-------+ PFV      Full                                                  +---------+---------------+---------+-----------+----------+-------+  POP      Full           Yes      Yes                          +---------+---------------+---------+-----------+----------+-------+ PTV      Full                                                 +---------+---------------+---------+-----------+----------+-------+ PERO     Full                                                 +---------+---------------+---------+-----------+----------+-------+ GSV      Full                                                 +---------+---------------+---------+-----------+----------+-------+    Summary: Right: There is no evidence of deep vein thrombosis in the lower extremity. Left: There is no evidence of deep vein thrombosis in the lower extremity. A cystic structure is found in the popliteal fossa.  *See table(s) above for measurements and observations. Electronically signed by Sherald Hesshristopher Clark MD on 03/17/2018 at 3:39:56 PM.    Final    Ct Maxillofacial Wo Contrast  Result Date: 03/19/2018 CLINICAL DATA:  58 y/o F; found on floor with laceration to head and injury to right cheek bone. EXAM: CT MAXILLOFACIAL WITHOUT CONTRAST CT CERVICAL SPINE WITHOUT CONTRAST TECHNIQUE: Multidetector CT imaging of the maxillofacial structures was performed. Multiplanar CT image reconstructions were also generated. A small metallic BB was placed on the right temple in order to reliably differentiate right from left. Multidetector CT imaging of the cervical spine was performed without intravenous contrast. Multiplanar CT image reconstructions were also generated. COMPARISON:  03/18/2018 CT head. FINDINGS: CT MAXILLOFACIAL FINDINGS Osseous: Multiple levels of motion artifact through the face. Acute fracture of the right floor of orbit measuring 12 x 10 mm (AP by ML series 16, image 30 and series 15, image 29) with 6 mm inferior displacement into the right maxillary sinus. No additional displaced facial  fracture or mandibular dislocation identified. Orbits: Small amount of emphysema is present within the right inferior palpebral as well as the extraconal fat of the right inferior orbit. No orbital or retro-orbital hematoma. No significant proptosis. Extraocular muscles are intact without findings of entrapment. Normal left orbit. Sinuses: Right maxillary sinus blood fluid level. Additional visible paranasal sinuses are normally aerated. Under pneumatization of the mastoid air cells. Pneumatized mastoid air cells are normally aerated. Soft tissues: Contusion of right facial and periorbital soft tissues. Limited intracranial: Please refer to same-day CT head. CT CERVICAL FINDINGS Alignment: Normal. Skull base and vertebrae: Motion artifact at the C2 and C6 levels. No acute fracture identified. No primary bone lesion or focal pathologic process. Soft tissues and spinal canal: No prevertebral fluid or swelling. No visible canal hematoma. Disc levels: Mild multilevel discogenic degenerative changes with small endplate marginal osteophytes. No significant bony foraminal or spinal canal stenosis. Upper chest: Negative. Other: Negative. IMPRESSION: Motion degraded study. 1. Acute fracture of  the right floor of orbit measuring up to 12 mm with 6 mm inferior displacement into the right maxillary sinus. Mild infraorbital extraconal pneumatosis. No orbital or retro-orbital hematoma. No significant proptosis. No muscle entrapment. 2. Right maxillary sinus blood fluid level. 3. No additional displaced fracture or dislocation of the facial bones. 4. Contusion of right facial and periorbital soft tissues. 5. No acute fracture or dislocation of the cervical spine. 6. Mild cervical spine degenerative changes. No significant bony foraminal or spinal canal stenosis. Electronically Signed   By: Mitzi Hansen M.D.   On: 03/19/2018 00:38      The results of significant diagnostics from this hospitalization (including  imaging, microbiology, ancillary and laboratory) are listed below for reference.     Microbiology: Recent Results (from the past 240 hour(s))  Urine Culture     Status: Abnormal   Collection Time: 04/03/18  9:58 AM  Result Value Ref Range Status   Specimen Description URINE, RANDOM  Final   Special Requests   Final    NONE Performed at [facility], [address], [place], [place] [address]    Culture (A)  Final    >=100,000 COLONIES/mL KLEBSIELLA SPECIES >=100,000 COLONIES/mL ENTEROCOCCUS FAECALIS    Report Status 04/06/2018 FINAL  Final   Organism ID, Bacteria KLEBSIELLA SPECIES (A)  Final   Organism ID, Bacteria ENTEROCOCCUS FAECALIS (A)  Final      Susceptibility   Enterococcus faecalis - MIC*    AMPICILLIN <=2 SENSITIVE Sensitive     LEVOFLOXACIN >=8 RESISTANT Resistant     NITROFURANTOIN 32 SENSITIVE Sensitive     VANCOMYCIN 1 SENSITIVE Sensitive     * >=100,000 COLONIES/mL ENTEROCOCCUS FAECALIS   Klebsiella species - MIC*    AMPICILLIN RESISTANT Resistant     CEFAZOLIN <=4 SENSITIVE Sensitive     CEFTRIAXONE <=1 SENSITIVE Sensitive     CIPROFLOXACIN <=0.25 SENSITIVE Sensitive     GENTAMICIN <=1 SENSITIVE Sensitive     IMIPENEM <=0.25 SENSITIVE Sensitive     NITROFURANTOIN <=16 SENSITIVE Sensitive     TRIMETH/SULFA <=20 SENSITIVE Sensitive     AMPICILLIN/SULBACTAM <=2 SENSITIVE Sensitive     PIP/TAZO <=4 SENSITIVE Sensitive     * >=100,000 COLONIES/mL KLEBSIELLA SPECIES  Urine culture     Status: Abnormal (Preliminary result)   Collection Time: 04/11/18 11:46 AM  Result Value Ref Range Status   Specimen Description   Final    URINE, CLEAN CATCH Performed at [facility], [address], [place], [place] [address]    Special Requests   Final    NONE Performed at [facility], [address], [place], [place] [address]    Culture (A)  Final    >=100,000 COLONIES/mL GRAM NEGATIVE RODS IDENTIFICATION AND  SUSCEPTIBILITIES TO FOLLOW Performed at [facility], [address], [place], [place] [address]    Report Status PENDING  Incomplete     Labs: BNP (last 3 results) Recent Labs    03/16/18 1805 04/09/18 0318 04/11/18 0858  BNP 643.0* 309.2* 388.7*   Basic Metabolic Panel: Recent Labs  Lab 04/09/18 0318 04/09/18 0330 04/11/18 0858 04/12/18 0559  NA 142 143 137 139  K 3.5 3.5 3.4* 3.0*  CL 105  --  99 95*  CO2 29  --  29 34*  GLUCOSE 101*  --  96 95  BUN <5*  --  8 9  CREATININE 0.37*  --  0.43* 0.53  CALCIUM 8.1*  --  8.2* 8.2*   Liver Function Tests: Recent Labs  Lab 04/11/18 0858 04/12/18 0559  AST 26 22  ALT 15 12  ALKPHOS 92 82  BILITOT 1.2 1.4*  PROT 6.3* 6.0*  ALBUMIN 3.5 3.3*   Recent Labs  Lab 04/11/18 0858  LIPASE 30   Recent Labs  Lab 04/11/18 1228  AMMONIA 12   CBC: Recent Labs  Lab 04/09/18 0318 04/09/18 0330 04/11/18 0858 04/12/18 0559  WBC 3.3*  --  4.2 3.1*  NEUTROABS 2.0  --  3.1  --   HGB 9.3* 10.2* 9.3* 8.6*  HCT 32.2* 30.0* 33.8* 30.8*  MCV 83.6  --  86.9 86.3  PLT 174  --  165 128*   Cardiac Enzymes: No results for input(s): CKTOTAL, CKMB, CKMBINDEX, TROPONINI in the last 168 hours. BNP: Invalid input(s): POCBNP CBG: No results for input(s): GLUCAP in the last 168 hours. D-Dimer No results for input(s): DDIMER in the last 72 hours. Hgb A1c No results for input(s): HGBA1C in the last 72 hours. Lipid Profile No results for input(s): CHOL, HDL, LDLCALC, TRIG, CHOLHDL, LDLDIRECT in the last 72 hours. Thyroid function studies No results for input(s): TSH, T4TOTAL, T3FREE, THYROIDAB in the last 72 hours.  Invalid input(s): FREET3 Anemia work up No results for input(s): VITAMINB12, FOLATE, FERRITIN, TIBC, IRON, RETICCTPCT in the last 72 hours. Urinalysis    Component Value Date/Time   COLORURINE YELLOW 04/11/2018 1146   APPEARANCEUR HAZY (A) 04/11/2018 1146   LABSPEC 1.015 04/11/2018 1146   PHURINE 7.0  04/11/2018 1146   GLUCOSEU NEGATIVE 04/11/2018 1146   HGBUR NEGATIVE 04/11/2018 1146   BILIRUBINUR NEGATIVE 04/11/2018 1146   BILIRUBINUR moderate (A) 06/19/2015 1906   KETONESUR 5 (A) 04/11/2018 1146   PROTEINUR NEGATIVE 04/11/2018 1146   UROBILINOGEN >=8.0 06/19/2015 1906   UROBILINOGEN 0.2 09/14/2011 2141   NITRITE POSITIVE (A) 04/11/2018 1146   LEUKOCYTESUR TRACE (A) 04/11/2018 1146   Sepsis Labs Invalid input(s): PROCALCITONIN,  WBC,  LACTICIDVEN Microbiology Recent Results (from the past 240 hour(s))  Urine Culture     Status: Abnormal   Collection Time: 04/03/18  9:58 AM  Result Value Ref Range Status   Specimen Description URINE, RANDOM  Final   Special Requests   Final    NONE Performed at [facility], [address], [place], [place] [address]    Culture (A)  Final    >=100,000 COLONIES/mL KLEBSIELLA SPECIES >=100,000 COLONIES/mL ENTEROCOCCUS FAECALIS    Report Status 04/06/2018 FINAL  Final   Organism ID, Bacteria KLEBSIELLA SPECIES (A)  Final   Organism ID, Bacteria ENTEROCOCCUS FAECALIS (A)  Final      Susceptibility   Enterococcus faecalis - MIC*    AMPICILLIN <=2 SENSITIVE Sensitive     LEVOFLOXACIN >=8 RESISTANT Resistant     NITROFURANTOIN 32 SENSITIVE Sensitive     VANCOMYCIN 1 SENSITIVE Sensitive     * >=100,000 COLONIES/mL ENTEROCOCCUS FAECALIS   Klebsiella species - MIC*    AMPICILLIN RESISTANT Resistant     CEFAZOLIN <=4 SENSITIVE Sensitive     CEFTRIAXONE <=1 SENSITIVE Sensitive     CIPROFLOXACIN <=0.25 SENSITIVE Sensitive     GENTAMICIN <=1 SENSITIVE Sensitive     IMIPENEM <=0.25 SENSITIVE Sensitive     NITROFURANTOIN <=16 SENSITIVE Sensitive     TRIMETH/SULFA <=20 SENSITIVE Sensitive     AMPICILLIN/SULBACTAM <=2 SENSITIVE Sensitive     PIP/TAZO <=4 SENSITIVE Sensitive     * >=100,000 COLONIES/mL KLEBSIELLA SPECIES  Urine culture  Status: Abnormal (Preliminary result)   Collection Time: 04/11/18 11:46 AM  Result Value Ref Range  Status   Specimen Description   Final    URINE, CLEAN CATCH Performed at [facility], [address], [place], [place] [address]    Special Requests   Final    NONE Performed at [facility], [address], [place], [place] [address]    Culture (A)  Final    >=100,000 COLONIES/mL GRAM NEGATIVE RODS IDENTIFICATION AND SUSCEPTIBILITIES TO FOLLOW Performed at [facility], [address] [place], [place] [address]    Report Status PENDING  Incomplete     Time coordinating discharge:  I have spent 35 minutes face to face with the patient and on the ward discussing the patients care, assessment, plan and disposition with other care givers. >50% of the time was devoted counseling the patient about the risks and benefits of treatment/Discharge disposition and coordinating care.   SIGNED:   Dimple Nanas, MD  [facility] 04/12/2018, 2:25 PM   If 7PM-7AM, please contact night-coverage [email or web address]

## 2018-04-12 NOTE — Progress Notes (Signed)
Had an extensive face to face discussion between myself, charge nurse, case manager, social worker, patient's nurse, patient's daughter Zollie Scale (over the phone), and the patient herself regarding her care during the hospitalization and disposition planning. We had extensively explained to the patient and her daughter that our case manager has reached out to several home health agencies but due to her noncompliance issues they are not willing to accommodate.  Apparently there has been issues with patient not opening the door or following instructions.  According to the daughter previously they have also tried private aids but none of them have returned due to the noncompliance issues. Our social worker has previously tried placement at a skilled nursing facility for the patient but she is always left AGAINST MEDICAL ADVICE.  Patient was given option about possible evaluation for skilled nursing facility versus going home.  Patient has opted to go home with home health.  Patient has been instructed that she will not qualify for home health therefore will have to pay out of pocket which she understands.  At this time patient is able to make her own medical decisions and she does not appear to be harmful to self or others.  She is stable to be discharged at her request.  Our case manager will try her best to help accommodate for services that she requires at home (which may all be out of pocket expenses).   I have also refilled again most of the patient's prescriptions and sent to her pharmacy. Short course of Librium taper prescribe, advised to refrain from Alcohol use. She still have the list of local Alcohol rehab places which she understands she needs to reach out.   Call with questions as needed.   Appreciate help and input from SW, CM, Water engineer.   Stephania Fragmin MD.

## 2018-04-12 NOTE — Progress Notes (Signed)
Patient discharged home via [place] taxi with voucher provided by the hospital. Patient has been educated on medications and follow up appts. Patient's valuables $1,507 were returned to patient from security office, patient signed formed and placed in chart.

## 2018-04-12 NOTE — Progress Notes (Signed)
Clinical Social Worker was consulted verbally by RN to assist patient in going to a SNF for rehab. CSW went into patients room with patients RN Anne Black) present. During CSW assessment with patient CSW went over discharge options with patients. Patient stated she would be agreeable to go to rehab facility but stated she would check herself out if she didn't like the facility. CSW spoke with patient in detail on why it is important to stay at a SNF for rehab so patient is able to get the care she needs. Patient then stated she would prefer to go home with private duty aides helping her around the house. Patient stated she is aware that she lives by herself would prefer to go back home. CSW stated patient research private agencies  that would be abel to help her around the house with basic ADLs. CSW stepped out of room after assessment with patient.  Ten minutes later CSW received a consult from RN stating that patient changed her mind about her discharge plans. CSW asked MD, RNCM, Charge RN and RN to come into the room with CSW to speak to patient about discharge plan. CSW asked patient if she would be willing to have patients daughter Anne Black on speaker phone to go over discharge plans. Anne Black stated that she does not feel that patient will be safe at home and she feels she needs to go to a SNF for rehab. Patient stated  She wants to go home with a private aide. Anne Black stated that family has tired to get a private duty aide in the home in the past but stated it has never worked out. Anne Black pleaded with patient to go to a SNF. Patient stated she does not want to go to SNF but stated she will go home with private aide. Patent stated she will go to a substance abuse rehab facility but not a SNF. CSW explained to daughter that because patient has capacity medical team will have to go with her decision. Patient addiment about going home. CSW signing off as social work needs have been met.   Anne Black, MSW,   Anne Black

## 2018-04-12 NOTE — Care Management Note (Signed)
Case Management Note  Patient Details  Name: Anne Black MRN: [id number] Date of Birth: 03-May-1960  Subjective/Objective: Chronic resp failure. COPD. From home. Has home 02[facility] to deliver home 02 travel tank to rm prior d/c. CM attempted several [facility]HC([facility]) agencies-patient well know for non compliance,& not answering the door in past with HHC agencies-not able to get a HHC agency to accept patient. MD notified.                Action/Plan:dc home w/home 02 travel tank.   Expected Discharge Date:  04/12/18               Expected Discharge Plan:  Home/Self Care  In-House Referral:     Discharge planning Services  CM Consult  Post Acute Care Choice:    Choice offered to:  Patient  DME Arranged:    DME Agency:     HH Arranged:    HH Agency:     Status of Service:  Completed, signed off  If discussed at [employer] of Stay Meetings, dates discussed:    Additional Comments:  Lanier Clam, RN 04/12/2018, 1:12 PM

## 2018-04-12 NOTE — Progress Notes (Signed)
Several conversations with patient by entire team-Dr/Charge nurse,nurse/CSW-awaiting patient to decide her d/c plan-] while dtr on speaker phone(Olivia)-patient decided to go home w/private duty services-CM contacted [facility] personal care services-[phone](independent services for private duty). No further CM needs.

## 2018-04-12 NOTE — Progress Notes (Signed)
OT Cancellation Note  Patient Details Name: CARABELLA WADDELL MRN: [id number] DOB: [birth year]   Cancelled Treatment:    Reason Eval/Treat Not Completed: PT screened, no needs identified, will sign off  Elnore Cosens 04/12/2018, 3:40 PM  Marica Otter, OTR/L Acute Rehabilitation Services [phone] [facility] pager [phone] office 04/12/2018

## 2018-04-12 NOTE — Progress Notes (Signed)
SATURATION QUALIFICATIONS: (This note is used to comply with regulatory documentation for home oxygen)  Patient Saturations on Room Air at Rest = 77%  Patient Saturations on Room Air while Ambulating = 81%  Patient Saturations on 4 Liters of oxygen while Ambulating = 91%  Please briefly explain why patient needs home oxygen: Upon applying 4 liters of o2 while ambulating, patient's saturation increased to 91%, patient short of breath and dizzy prior to oxygen being applied.

## 2018-04-12 NOTE — Progress Notes (Signed)
Spoke to patient's dtr per patient request-informed of non compliance, inability to get a [facility] agency. Dtr voiced understanding. Patient to call for taxi on own.

## 2018-04-12 NOTE — Progress Notes (Signed)
Provided transportation taxi voucher-[employer], confirmed address,form completed & given white form to nurse to call when ready. Yellow copy kept. Patient unable to have family member to pick up,has home 02. No further CM needs.

## 2018-04-12 NOTE — Progress Notes (Signed)
PT Cancellation Note  Patient Details Name: Anne Black MRN: [id number] DOB: 1960/12/29   Cancelled Treatment:    Reason Eval/Treat Not Completed: PT screened, no needs identified, will sign off   Rada Hay 04/12/2018, 3:35 PM  Blanchard Kelch PT Acute Rehabilitation Services Pager [phone] Office [phone]

## 2018-04-13 LAB — URINE CULTURE: Culture: >=100000 — AB

## 2018-04-13 IMAGING — US US ABDOMEN LIMITED
1 series · 7 of 7 positions shown · non-contrast
Comparison: None.

CLINICAL DATA: Patient with a history of alcoholic cirrhosis and
ascites. She is currently on diuretics to help with control of her
ascites. A request for a diagnostic and therapeutic paracentesis has
been made.

EXAM:
LIMITED ABDOMEN ULTRASOUND FOR ASCITES
TECHNIQUE: Limited ultrasound survey for ascites was performed in all four
abdominal quadrants.

[Series 1: us abdomen limited · 0.26mm/px · 7 of 7 slices shown]
[im 1/7]
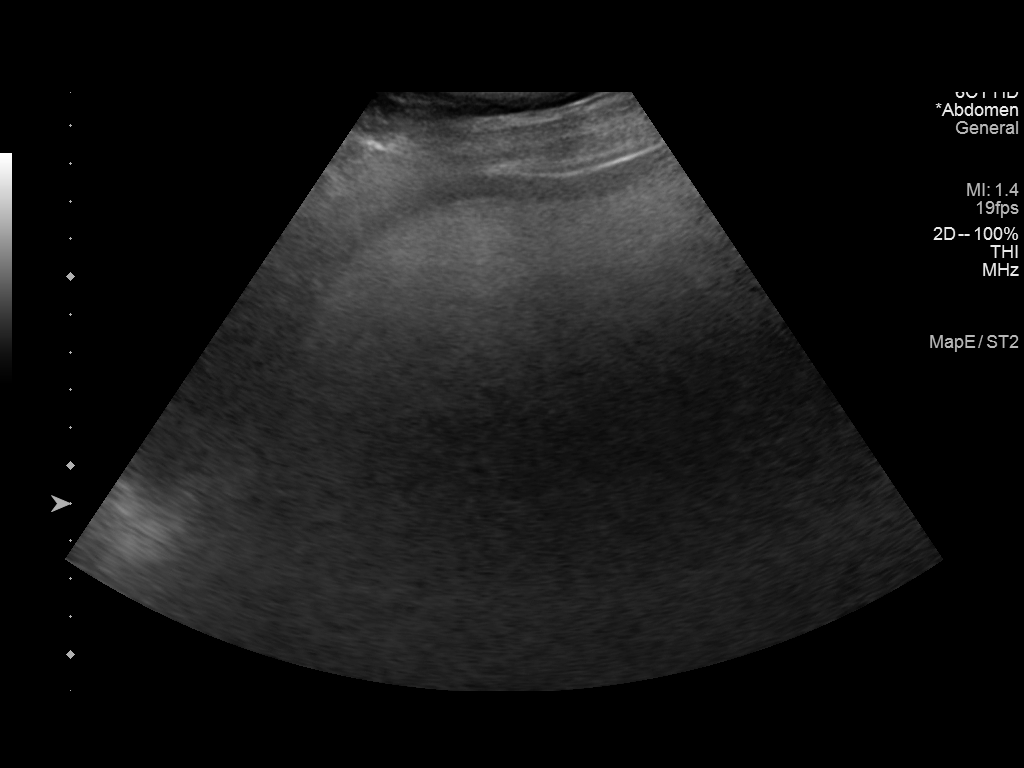
[im 2/7]
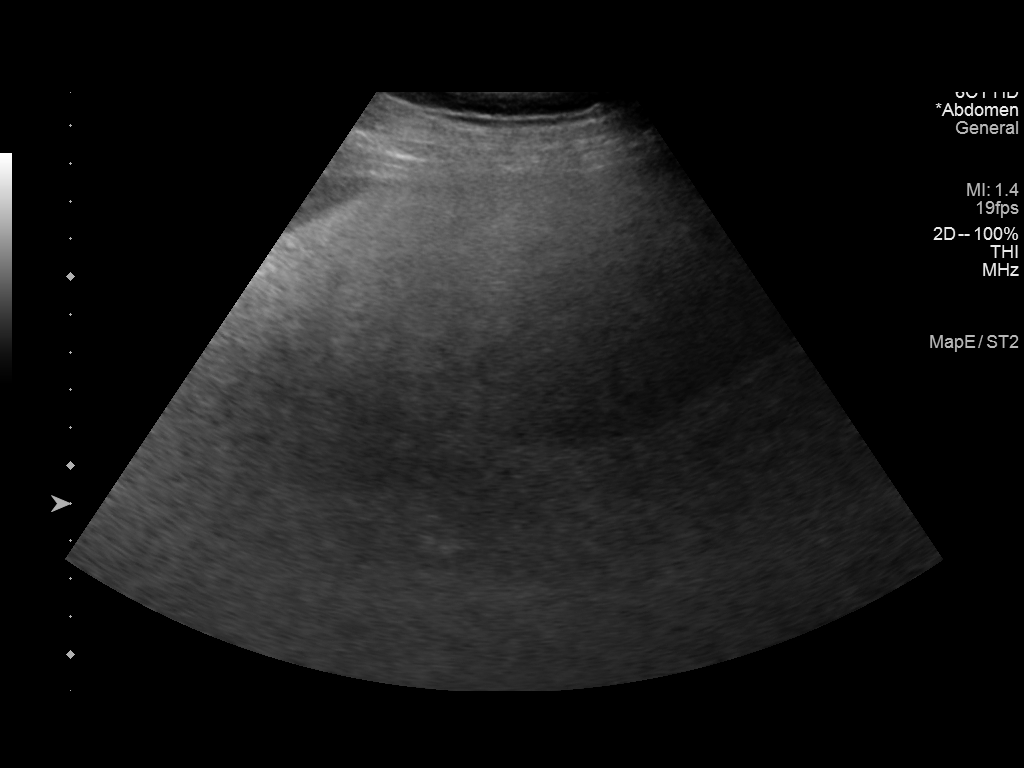
[im 3/7]
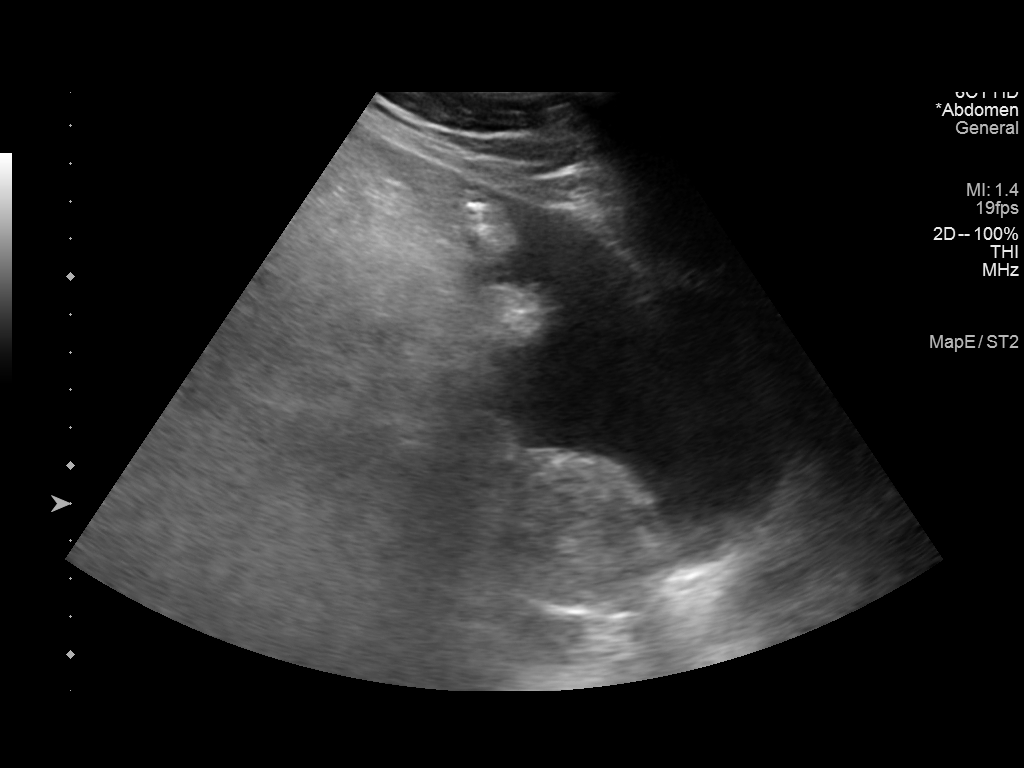
[im 4/7]
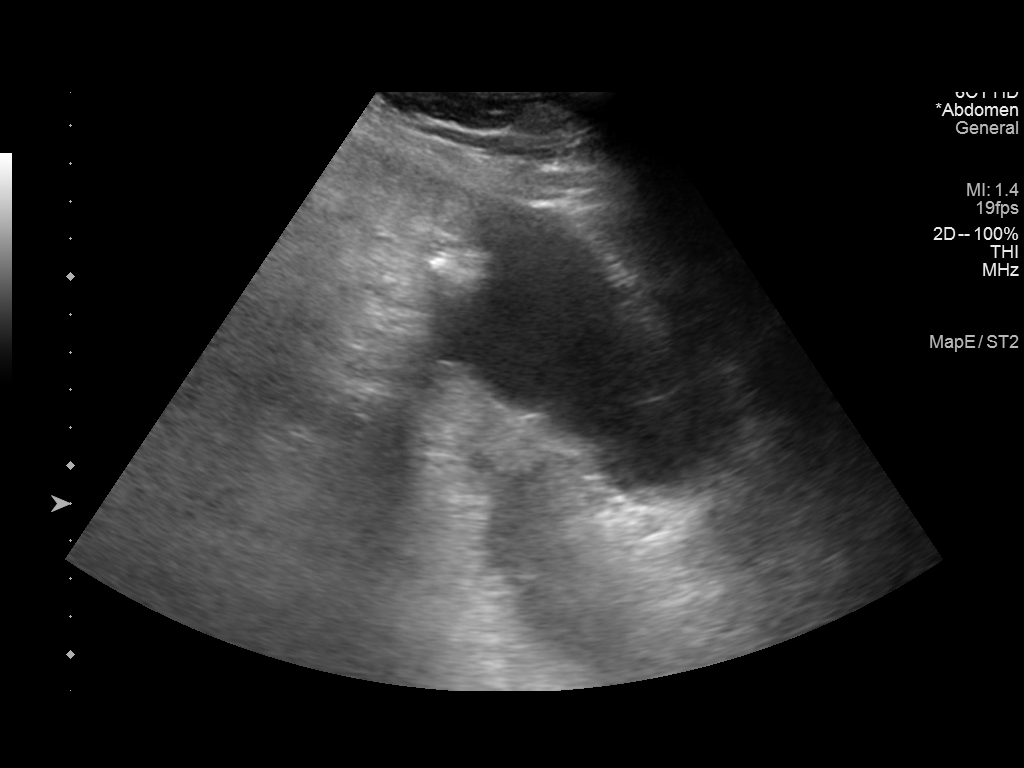
[im 5/7]
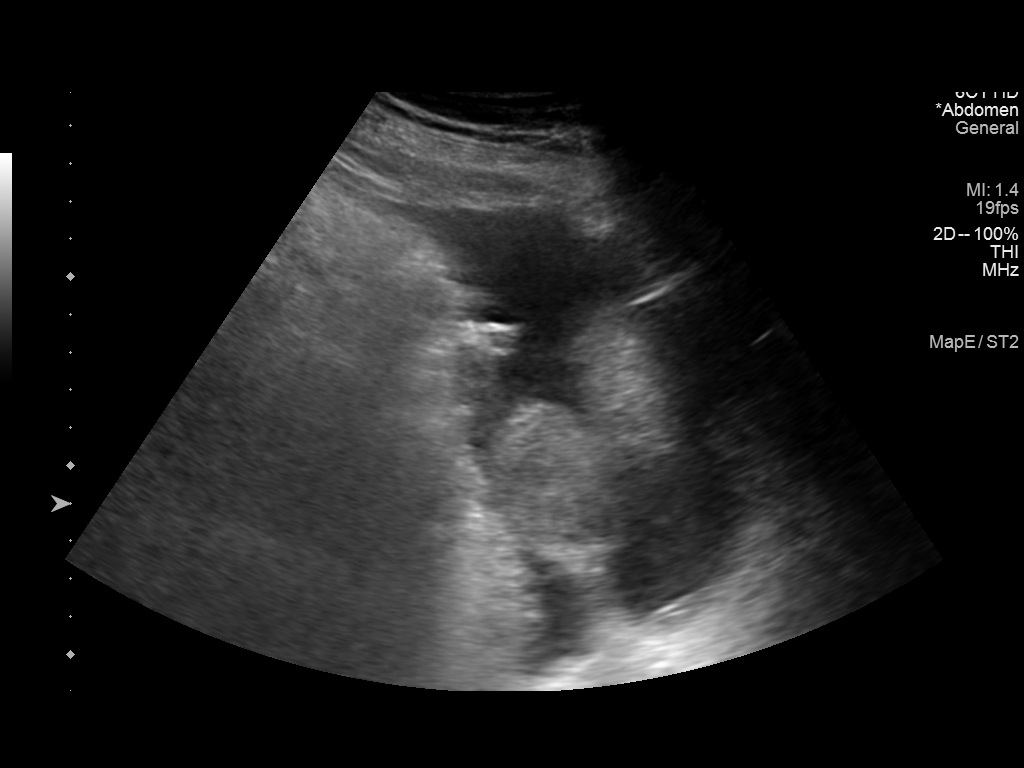
[im 6/7]
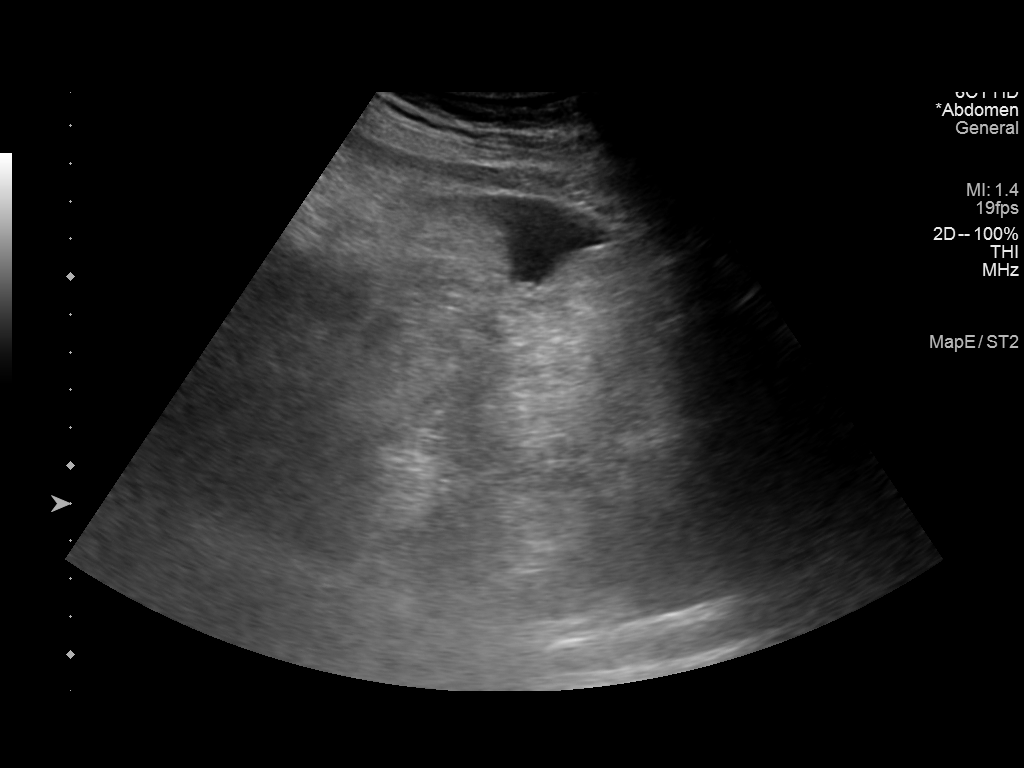
[im 7/7]
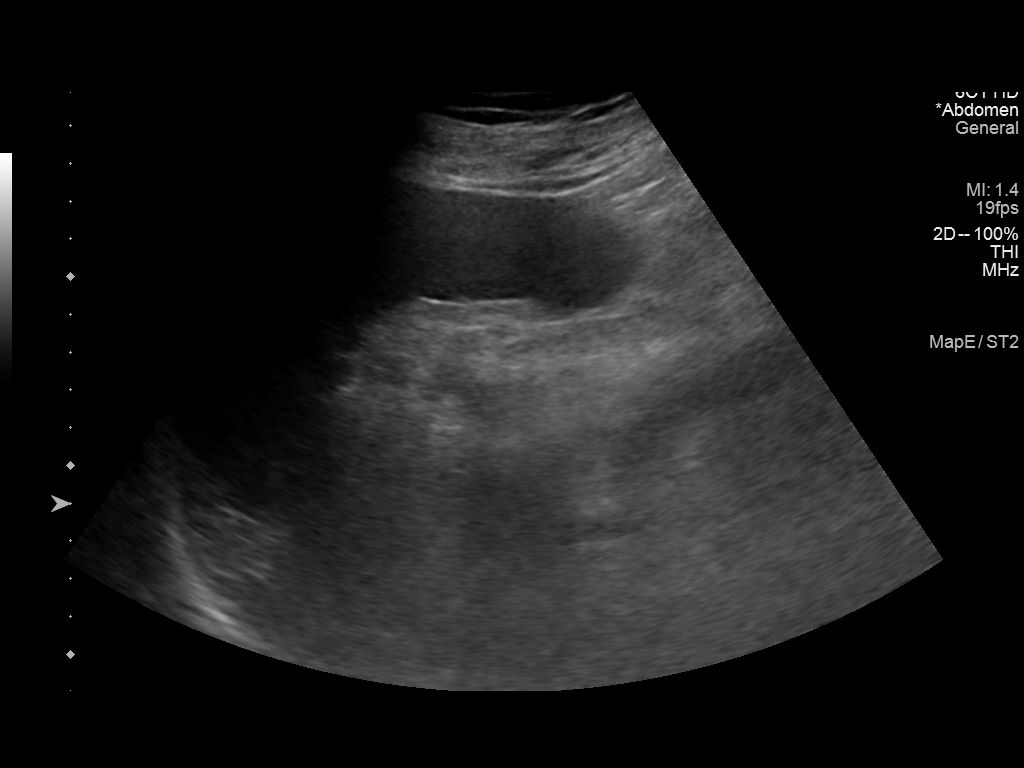

[7 of 7 positions shown; findings below may reference images not displayed]

FINDINGS: A small pocket of ascites was noted in the central lower abdomen
just next to the bladder and superior to the uterus. All other
quadrants were scanned with no significant ascites.
IMPRESSION: Insufficient ascites noted in the abdominal cavity to be able to
safely perform a paracentesis.

## 2018-04-14 ENCOUNTER — Emergency Department (HOSPITAL_COMMUNITY): Payer: 59

## 2018-04-14 ENCOUNTER — Encounter (HOSPITAL_COMMUNITY): Payer: Self-pay

## 2018-04-14 ENCOUNTER — Other Ambulatory Visit: Payer: Self-pay

## 2018-04-14 ENCOUNTER — Emergency Department (HOSPITAL_COMMUNITY)
Admission: EM | Admit: 2018-04-14 | Discharge: 2018-04-14 | Disposition: A | Discharge status: Home / Self Care | Payer: 59 | Attending: Emergency Medicine | Admitting: Emergency Medicine

## 2018-04-14 DIAGNOSIS — Z9114 Patient's other noncompliance with medication regimen: Secondary | ICD-10-CM | POA: Insufficient documentation

## 2018-04-14 DIAGNOSIS — W07XXXA Fall from chair, initial encounter: Secondary | ICD-10-CM | POA: Insufficient documentation

## 2018-04-14 DIAGNOSIS — Z79899 Other long term (current) drug therapy: Secondary | ICD-10-CM | POA: Insufficient documentation

## 2018-04-14 DIAGNOSIS — R110 Nausea: Secondary | ICD-10-CM | POA: Insufficient documentation

## 2018-04-14 DIAGNOSIS — F10220 Alcohol dependence with intoxication, uncomplicated: Secondary | ICD-10-CM

## 2018-04-14 DIAGNOSIS — M25562 Pain in left knee: Secondary | ICD-10-CM | POA: Insufficient documentation

## 2018-04-14 DIAGNOSIS — J449 Chronic obstructive pulmonary disease, unspecified: Secondary | ICD-10-CM | POA: Diagnosis not present

## 2018-04-14 DIAGNOSIS — R600 Localized edema: Secondary | ICD-10-CM | POA: Insufficient documentation

## 2018-04-14 DIAGNOSIS — E876 Hypokalemia: Secondary | ICD-10-CM | POA: Diagnosis not present

## 2018-04-14 DIAGNOSIS — R609 Edema, unspecified: Secondary | ICD-10-CM

## 2018-04-14 DIAGNOSIS — F17210 Nicotine dependence, cigarettes, uncomplicated: Secondary | ICD-10-CM | POA: Diagnosis not present

## 2018-04-14 DIAGNOSIS — F10229 Alcohol dependence with intoxication, unspecified: Secondary | ICD-10-CM | POA: Insufficient documentation

## 2018-04-14 DIAGNOSIS — R51 Headache: Secondary | ICD-10-CM | POA: Diagnosis not present

## 2018-04-14 DIAGNOSIS — W19XXXA Unspecified fall, initial encounter: Secondary | ICD-10-CM

## 2018-04-14 LAB — URINALYSIS, ROUTINE W REFLEX MICROSCOPIC
BILIRUBIN URINE: NEGATIVE
Glucose, UA: NEGATIVE mg/dL
Hgb urine dipstick: NEGATIVE
Ketones, ur: 20 mg/dL — AB
Leukocytes,Ua: NEGATIVE
Nitrite: NEGATIVE
Protein, ur: NEGATIVE mg/dL
SPECIFIC GRAVITY, URINE: 1.017 (ref 1.005–1.030)
pH: 5 (ref 5.0–8.0)

## 2018-04-14 LAB — CBC WITH DIFFERENTIAL/PLATELET
Abs Immature Granulocytes: 0.01 10*3/uL (ref 0.00–0.07)
Basophils Absolute: 0 10*3/uL (ref 0.0–0.1)
Basophils Relative: 1 %
EOS ABS: 0.1 10*3/uL (ref 0.0–0.5)
EOS PCT: 3 %
HCT: 31.5 % — ABNORMAL LOW (ref 36.0–46.0)
Hemoglobin: 8.7 g/dL — ABNORMAL LOW (ref 12.0–15.0)
Immature Granulocytes: 0 %
Lymphocytes Relative: 27 %
Lymphs Abs: 0.8 10*3/uL (ref 0.7–4.0)
MCH: 23.6 pg — ABNORMAL LOW (ref 26.0–34.0)
MCHC: 27.6 g/dL — AB (ref 30.0–36.0)
MCV: 85.6 fL (ref 80.0–100.0)
Monocytes Absolute: 0.4 10*3/uL (ref 0.1–1.0)
Monocytes Relative: 13 %
Neutro Abs: 1.7 10*3/uL (ref 1.7–7.7)
Neutrophils Relative %: 56 %
Platelets: 128 10*3/uL — ABNORMAL LOW (ref 150–400)
RBC: 3.68 MIL/uL — ABNORMAL LOW (ref 3.87–5.11)
RDW: 19.1 % — ABNORMAL HIGH (ref 11.5–15.5)
WBC: 3 10*3/uL — ABNORMAL LOW (ref 4.0–10.5)
nRBC: 0 % (ref 0.0–0.2)

## 2018-04-14 LAB — RAPID URINE DRUG SCREEN, HOSP PERFORMED
Amphetamines: NOT DETECTED
Barbiturates: NOT DETECTED
Benzodiazepines: POSITIVE — AB
Cocaine: NOT DETECTED
Opiates: NOT DETECTED
Tetrahydrocannabinol: NOT DETECTED

## 2018-04-14 LAB — I-STAT TROPONIN, ED: Troponin i, poc: 0.02 ng/mL (ref 0.00–0.08)

## 2018-04-14 LAB — COMPREHENSIVE METABOLIC PANEL
ALT: 14 U/L (ref 0–44)
AST: 25 U/L (ref 15–41)
Albumin: 3.4 g/dL — ABNORMAL LOW (ref 3.5–5.0)
Alkaline Phosphatase: 84 U/L (ref 38–126)
Anion gap: 11 (ref 5–15)
BUN: 5 mg/dL — ABNORMAL LOW (ref 6–20)
CALCIUM: 7.9 mg/dL — AB (ref 8.9–10.3)
CO2: 33 mmol/L — ABNORMAL HIGH (ref 22–32)
Chloride: 95 mmol/L — ABNORMAL LOW (ref 98–111)
Creatinine, Ser: 0.39 mg/dL — ABNORMAL LOW (ref 0.44–1.00)
GFR calc Af Amer: >60 mL/min (ref >60)
Glucose, Bld: 86 mg/dL (ref 70–99)
Potassium: 2.9 mmol/L — ABNORMAL LOW (ref 3.5–5.1)
Sodium: 139 mmol/L (ref 135–145)
Total Bilirubin: 0.9 mg/dL (ref 0.3–1.2)
Total Protein: 6.2 g/dL — ABNORMAL LOW (ref 6.5–8.1)

## 2018-04-14 LAB — ETHANOL: ALCOHOL ETHYL (B): 148 mg/dL — AB (ref <10)

## 2018-04-14 LAB — BRAIN NATRIURETIC PEPTIDE: B NATRIURETIC PEPTIDE 5: 336.8 pg/mL — AB (ref 0.0–100.0)

## 2018-04-14 LAB — CK: Total CK: 58 U/L (ref 38–234)

## 2018-04-14 LAB — LIPASE, BLOOD: LIPASE: 26 U/L (ref 11–51)

## 2018-04-14 MED ORDER — DILTIAZEM HCL ER COATED BEADS 120 MG PO CP24: 120.0000 mg | ORAL_CAPSULE | Freq: Every day | ORAL | 0 refills | Status: DC

## 2018-04-14 MED ORDER — FUROSEMIDE 40 MG PO TABS: ORAL_TABLET | ORAL | 0 refills | Status: DC

## 2018-04-14 MED ORDER — PANTOPRAZOLE SODIUM 40 MG PO TBEC: 40.0000 mg | DELAYED_RELEASE_TABLET | Freq: Every day | ORAL | 0 refills | Status: DC

## 2018-04-14 MED ORDER — GABAPENTIN 100 MG PO CAPS: 200.0000 mg | ORAL_CAPSULE | Freq: Three times a day (TID) | ORAL | 0 refills | Status: DC

## 2018-04-14 MED ORDER — ONDANSETRON HCL 4 MG/2ML IJ SOLN
4.0000 mg | Freq: Once | INTRAMUSCULAR | Status: AC
  Administered 2018-04-14: 4 mg via INTRAVENOUS
  Filled 2018-04-14: qty 2

## 2018-04-14 MED ORDER — HYDROXYZINE HCL 25 MG PO TABS: 25.0000 mg | ORAL_TABLET | Freq: Every day | ORAL | 0 refills | Status: AC | PRN

## 2018-04-14 MED ORDER — CHLORDIAZEPOXIDE HCL 25 MG PO CAPS: ORAL_CAPSULE | ORAL | 0 refills | Status: DC

## 2018-04-14 MED ORDER — FLUOXETINE HCL 20 MG PO CAPS: 20.0000 mg | ORAL_CAPSULE | Freq: Every day | ORAL | 0 refills | Status: DC

## 2018-04-14 MED ORDER — NITROFURANTOIN MONOHYD MACRO 100 MG PO CAPS: 100.0000 mg | ORAL_CAPSULE | Freq: Two times a day (BID) | ORAL | 0 refills | Status: AC

## 2018-04-14 MED ORDER — SPIRONOLACTONE 25 MG PO TABS: 25.0000 mg | ORAL_TABLET | Freq: Every day | ORAL | 0 refills | Status: DC

## 2018-04-14 MED ORDER — LACTULOSE 10 GM/15ML PO SOLN: 30.0000 g | Freq: Three times a day (TID) | ORAL | 0 refills | Status: AC

## 2018-04-14 MED ORDER — THIAMINE HCL 100 MG PO TABS: 100.0000 mg | ORAL_TABLET | Freq: Every day | ORAL | 0 refills | Status: DC

## 2018-04-14 MED ORDER — ONDANSETRON 4 MG PO TBDP: 4.0000 mg | ORAL_TABLET | Freq: Three times a day (TID) | ORAL | 0 refills | Status: DC | PRN

## 2018-04-14 MED ORDER — POTASSIUM CHLORIDE CRYS ER 20 MEQ PO TBCR
40.0000 meq | EXTENDED_RELEASE_TABLET | Freq: Once | ORAL | Status: AC
  Administered 2018-04-14: 40 meq via ORAL
  Filled 2018-04-14: qty 2

## 2018-04-14 MED FILL — SPIRONOLACTONE 25 MG TABS: 25 | 30 days supply | Qty: 30 | Fill #0

## 2018-04-14 MED FILL — ONDANSETRON ODT 4 MG TABLET: 4 | 3 days supply | Qty: 9 | Fill #0

## 2018-04-14 MED FILL — CARTIA XT 120 MG CP24: 120 | 30 days supply | Qty: 30 | Fill #0

## 2018-04-14 MED FILL — NITROFURANTOIN MONO-MCR 100: 100 | 5 days supply | Qty: 10 | Fill #0

## 2018-04-14 MED FILL — hydrOXYzine HCL 25 MG TABS: 25 | 30 days supply | Qty: 30 | Fill #0

## 2018-04-14 MED FILL — GABAPENTIN 100 MG CAPSULE: 100 | 15 days supply | Qty: 90 | Fill #0

## 2018-04-14 MED FILL — CHLORDIAZEPOXIDE 25 MG CAP: 25 | 3 days supply | Qty: 6 | Fill #0

## 2018-04-14 MED FILL — PANTOPRAZOLE SOD DR 40 MG T: 40 | 30 days supply | Qty: 30 | Fill #0

## 2018-04-14 MED FILL — FUROSEMIDE 40 MG TAB: 40 | 30 days supply | Qty: 30 | Fill #0

## 2018-04-14 MED FILL — FLUoxetine HCL 20 MG CAPS: 20 | 30 days supply | Qty: 30 | Fill #0

## 2018-04-14 NOTE — ED Notes (Signed)
TRANSPORTED BY [facility]- MEDICATIONS WITH PT

## 2018-04-14 NOTE — Discharge Instructions (Signed)
Your work-up today showed labs similar to prior.  Has you have been unable to get your medications as an outpatient, we were able to coordinate care coordination, social work, and pharmacy to help fill the prescriptions from your recent discharged today.  Please fill and take these medications to help treat your medical problems so that you will be eligible to go to the alcohol rehab facility you were reporting.  Please follow-up with your primary physician as well.  Please be careful not to fall and use the knee immobilizer.  Please rest and try to avoid alcohol.  If any symptoms change or worsen, was return to the nearest emergency department.

## 2018-04-14 NOTE — ED Provider Notes (Signed)
[facility]-EMERGENCY DEPT Provider Note   CSN: [id number] Arrival date & time: 04/14/18  1517    History   Chief Complaint Chief Complaint  Patient presents with  . Fall  . Knee Pain    HPI Anne Black is a 58 y.o. female.     The history is provided by the patient and medical records. No language interpreter was used.  Fall  This is a new problem. The current episode started 2 days ago. The problem occurs constantly. The problem has not changed since onset.Pertinent negatives include no chest pain, no abdominal pain, no headaches and no shortness of breath. The symptoms are aggravated by walking. Nothing relieves the symptoms. She has tried nothing for the symptoms.  Knee Pain  Location:  Knee Time since incident:  2 days Injury: yes   Mechanism of injury: fall   Fall:    Fall occurred: froma chair.   Point of impact:  Knees Knee location:  L knee Pain details:    Quality:  Aching   Radiates to:  Does not radiate   Onset quality:  Sudden   Timing:  Constant Chronicity:  New Dislocation: no   Associated symptoms: no back pain, no fatigue, no fever and no neck pain     Past Medical History:  Diagnosis Date  . ADHD (attention deficit hyperactivity disorder)   . Alcoholism (HCC)   . Anxiety   . Arthritis   . Ascites   . Asthma   . Bipolar disorder (HCC)   . Cirrhosis (HCC)   . COPD (chronic obstructive pulmonary disease) (HCC)   . Dyspnea   . Dysrhythmia   . History of hiatal hernia   . Hypokalemia   . Migraine   . Multiple sclerosis (HCC)   . Narcolepsy   . Osteoporosis   . Pneumonia     Patient Active Problem List   Diagnosis Date Noted  . Chronic hypoxemic respiratory failure (HCC) 04/11/2018  . Atrial fibrillation with rapid ventricular response (HCC) 03/23/2018  . Head injury   . Fall 03/19/2018  . Syncope 03/19/2018  . Orbital fracture 03/19/2018  . Scalp laceration 03/19/2018  . Acute respiratory failure with hypoxia  and hypercarbia (HCC) 03/19/2018  . Abdominal tenderness 03/19/2018  . Acute on chronic respiratory failure with hypoxia (HCC) 03/17/2018  . Alcohol abuse with intoxication (HCC) 03/17/2018  . ETOH abuse   . Normocytic anemia 03/16/2018  . Acute metabolic encephalopathy 03/16/2018  . Lower leg edema 03/16/2018  . Migraine 03/16/2018  . A-fib (HCC) 02/18/2018  . Community acquired pneumonia of left lower lobe of lung (HCC)   . Weakness 08/21/2017  . COPD exacerbation (HCC) 06/12/2017  . Chest pain with moderate risk for cardiac etiology 05/30/2017  . Abnormal LFTs 05/30/2017  . Tachycardia 05/30/2017  . COPD (chronic obstructive pulmonary disease) (HCC) 04/19/2017  . Right rib fracture 04/15/2017  . Abdominal wall cellulitis 12/30/2016  . S/P exploratory laparotomy 12/30/2016  . Distal radius fracture, left 09/09/2016  . Thrombocytopenia (HCC) 09/09/2016  . Chest x-ray abnormality   . Umbilical hernia without obstruction and without gangrene 05/12/2016  . Itching 02/24/2016  . Ventral hernia without obstruction or gangrene 02/24/2016  . Palliative care encounter   . Ascites   . Tachypnea   . Atrial flutter with rapid ventricular response (HCC)   . Hypomagnesemia   . Hypoxia   . Dehydration 01/22/2016  . Low back ache 12/30/2015  . Non-intractable vomiting with nausea   .  Alcohol abuse with alcohol-induced mood disorder (HCC) 12/15/2015  . Acute alcoholism (HCC)   . Abdominal distension 11/04/2015  . Alcohol abuse   . Dyspnea   . Acute respiratory failure (HCC) 10/15/2015  . Ascites due to alcoholic cirrhosis (HCC) 10/15/2015  . Osteoporosis   . Multiple sclerosis (HCC)   . Alcoholic cirrhosis of liver with ascites (HCC) 08/22/2015  . Bipolar disorder, current episode mixed, moderate (HCC) 08/01/2015  . Alcohol use disorder, severe, dependence (HCC) 07/31/2015  . Macrocytic anemia- due to alcohol abuse with normal B12 & folate levels 05/31/2015  . Severe protein-calorie  malnutrition (HCC) 05/31/2015  . Hypokalemia 05/26/2015  . Encephalopathy, hepatic (HCC) 05/26/2015  . Alcohol withdrawal (HCC) 05/18/2015  . Abdominal pain 05/18/2015  . Anxiety 05/18/2015  . Severe sepsis (HCC) 05/18/2015  . UTI (lower urinary tract infection) 05/18/2015  . Stimulant abuse (HCC) 12/27/2014  . Nicotine abuse 12/27/2014  . Hyperprolactinemia (HCC) 11/15/2014  . Dyslipidemia   . Alcohol use disorder, moderate, dependence (HCC) 10/30/2014  . Tobacco abuse 01/23/2014  . Bipolar 1 disorder (HCC) 04/07/2013  . Noncompliance with therapeutic plan 04/04/2013  . Hereditary and idiopathic peripheral neuropathy 05/01/2012  . Depression 05/01/2012  . GERD (gastroesophageal reflux disease) 05/01/2012  . Osteoarthrosis, unspecified whether generalized or localized, involving lower leg 05/01/2012  . Hyponatremia 07/01/2011    Past Surgical History:  Procedure Laterality Date  . ABDOMINAL WALL DEFECT REPAIR N/A 12/30/2016   Procedure: EXPLORATORY LAPAROTOMY WITH REPAIR ABDOMINAL WALL VENTRAL HERNIA;  Surgeon: [name], MD;  Location: [facility] OR;  Service: General;  Laterality: N/A;  . APPLICATION OF WOUND VAC N/A 12/30/2016   Procedure: APPLICATION OF WOUND VAC;  Surgeon: [name], MD;  Location: [facility] OR;  Service: General;  Laterality: N/A;  . CESAREAN SECTION  [phone]  . FRACTURE SURGERY    . HERNIA REPAIR    . MYRINGOTOMY WITH TUBE PLACEMENT Bilateral   . ORIF WRIST FRACTURE Left 09/09/2016   Procedure: OPEN REDUCTION INTERNAL FIXATION (ORIF) LEFT WRIST FRACTURE, LEFT CARPAL TUNNEL RELEASE;  Surgeon: Dominica Severin, MD;  Location: [facility] ORS;  Service: Orthopedics;  Laterality: Left;  . TONSILLECTOMY       OB History   No obstetric history on file.      Home Medications    Prior to Admission medications   Medication Sig Start Date End Date Taking? Authorizing Provider  acetaminophen (TYLENOL) 500 MG tablet Take 1 tablet (500 mg total) by mouth every 6  (six) hours as needed for mild pain. Patient taking differently: Take 1,000 mg by mouth every 6 (six) hours as needed for mild pain.  03/28/18   Rolly Salter, MD  aluminum-magnesium hydroxide-simethicone (MAALOX) 200-200-20 MG/5ML SUSP Take 30 mLs by mouth 4 (four) times daily as needed (indigestion).     [provider]  chlordiazePOXIDE (LIBRIUM) 25 MG capsule Take 1 capsule (25 mg total) by mouth 3 (three) times daily for 1 day, THEN 1 capsule (25 mg total) 2 (two) times daily for 1 day, THEN 1 capsule (25 mg total) daily for 1 day. 04/12/18 04/15/18  Amin, Loura Halt, MD  diltiazem (CARDIZEM CD) 120 MG 24 hr capsule Take 1 capsule (120 mg total) by mouth daily for 30 days. 04/12/18 05/12/18  Amin, Loura Halt, MD  FLUoxetine (PROZAC) 20 MG capsule Take 1 capsule (20 mg total) by mouth daily for 30 doses. 04/12/18 05/12/18  Amin, Ankit Chirag, MD  Fluticasone-Salmeterol (WIXELA INHUB) 250-50 MCG/DOSE AEPB Inhale 1 puff into the lungs  2 (two) times daily.    [provider]  folic acid (FOLVITE) 1 MG tablet Take 1 tablet (1 mg total) by mouth daily for 30 days. 04/12/18 05/12/18  Dimple NanasAmin, Ankit Chirag, MD  furosemide (LASIX) 40 MG tablet Take 40 mg twice daily till 04/02/2018, after that take 40 mg daily. 04/12/18   Amin, Loura HaltAnkit Chirag, MD  gabapentin (NEURONTIN) 100 MG capsule Take 2 capsules (200 mg total) by mouth 3 (three) times daily for 30 days. 04/12/18 05/12/18  Amin, Loura HaltAnkit Chirag, MD  hydrocortisone cream 1 % Apply topically 3 (three) times daily. Patient not taking: Reported on 04/03/2018 03/28/18   Rolly SalterPatel, Pranav M, MD  hydrOXYzine (ATARAX/VISTARIL) 25 MG tablet Take 1 tablet (25 mg total) by mouth daily as needed for up to 30 doses (anxiety). 04/12/18   Amin, Loura HaltAnkit Chirag, MD  lactulose (CHRONULAC) 10 GM/15ML solution Take 45 mLs (30 g total) by mouth 3 (three) times daily for 30 days. 04/12/18 05/12/18  Amin, Ankit Chirag, MD  lidocaine (LIDODERM) 5 % Place 1 patch onto the skin daily. Remove &  Discard patch within 12 hours or as directed by MD 04/09/18   Nicanor AlconPalumbo, April, MD  nicotine (NICODERM CQ - DOSED IN MG/24 HOURS) 21 mg/24hr patch Place 1 patch (21 mg total) onto the skin daily. 03/29/18   Rolly SalterPatel, Pranav M, MD  nitrofurantoin, macrocrystal-monohydrate, (MACROBID) 100 MG capsule Take 1 capsule (100 mg total) by mouth 2 (two) times daily for 5 days. 04/12/18 04/17/18  Amin, Loura HaltAnkit Chirag, MD  ondansetron (ZOFRAN-ODT) 4 MG disintegrating tablet Take 4 mg by mouth every 8 (eight) hours as needed for nausea or vomiting.    [provider]  pantoprazole (PROTONIX) 40 MG tablet Take 1 tablet (40 mg total) by mouth daily for 30 days. 04/12/18 05/12/18  Amin, Loura HaltAnkit Chirag, MD  predniSONE (DELTASONE) 20 MG tablet 3 tabs po day one, then 2 po daily x 4 days 04/09/18   Palumbo, April, MD  promethazine (PHENERGAN) 25 MG tablet Take 1 tablet (25 mg total) by mouth every 6 (six) hours as needed for nausea or vomiting. 03/17/18   Clydie BraunSmith, Rondell A, MD  rizatriptan (MAXALT) 10 MG tablet Take 10 mg by mouth as needed for migraine. May repeat in 2 hours if needed    [provider]  spironolactone (ALDACTONE) 25 MG tablet Take 1 tablet (25 mg total) by mouth daily for 30 days. 04/12/18 05/12/18  Amin, Loura HaltAnkit Chirag, MD  thiamine 100 MG tablet Take 1 tablet (100 mg total) by mouth daily for 30 days. 04/12/18 05/12/18  Amin, Loura HaltAnkit Chirag, MD  VENTOLIN HFA 108 (90 Base) MCG/ACT inhaler Inhale 2 puffs into the lungs every 4 (four) hours as needed for wheezing or shortness of breath. 03/16/18   Virgina Norfolkuratolo, Adam, DO    Family History Family History  Problem Relation Age of Onset  . Arrhythmia Mother   . Heart disease Father   . Hypertension Father     Social History Social History   Tobacco Use  . Smoking status: Current Every Day Smoker    Packs/day: 1.00    Years: 35.00    Pack years: 35.00    Types: Cigarettes  . Smokeless tobacco: Never Used  . Tobacco comment: Pt states smoked this morning  Substance  Use Topics  . Alcohol use: Yes    Comment: 1 bottle of wine per day per patient.   . Drug use: No     Allergies   Patient has no  known allergies.   Review of Systems Review of Systems  Constitutional: Negative for chills, diaphoresis, fatigue and fever.  HENT: Negative for congestion and rhinorrhea.   Respiratory: Negative for cough, chest tightness, shortness of breath and wheezing.   Cardiovascular: Negative for chest pain, palpitations and leg swelling.  Gastrointestinal: Negative for abdominal pain, constipation, diarrhea, nausea, rectal pain and vomiting.  Genitourinary: Negative for dysuria, flank pain and frequency.  Musculoskeletal: Negative for back pain, myalgias, neck pain and neck stiffness.  Skin: Negative for rash and wound.  Neurological: Negative for weakness, light-headedness, numbness and headaches.  Psychiatric/Behavioral: Negative for agitation.  All other systems reviewed and are negative.    Physical Exam Updated Vital Signs BP 102/74 (BP Location: Right Arm)   Pulse 86   Temp 97.9 F (36.6 C) (Oral)   Resp 16   SpO2 90%   Physical Exam Vitals signs and nursing note reviewed.  Constitutional:      General: She is not in acute distress.    Appearance: She is not ill-appearing, toxic-appearing or diaphoretic.  HENT:     Head: Normocephalic and atraumatic.     Nose: No congestion or rhinorrhea.     Mouth/Throat:     Pharynx: No oropharyngeal exudate or posterior oropharyngeal erythema.  Eyes:     Extraocular Movements: Extraocular movements intact.     Conjunctiva/sclera: Conjunctivae normal.     Pupils: Pupils are equal, round, and reactive to light.  Neck:     Musculoskeletal: No muscular tenderness.  Cardiovascular:     Rate and Rhythm: Normal rate.     Pulses: Normal pulses.  Pulmonary:     Breath sounds: No stridor. No wheezing, rhonchi or rales.  Chest:     Chest wall: No tenderness.  Abdominal:     General: Abdomen is flat. There  is no distension.     Tenderness: There is no abdominal tenderness.  Musculoskeletal:        General: Tenderness present.     Left knee: She exhibits normal range of motion and no swelling. Tenderness found.     Right lower leg: Edema present.     Left lower leg: Edema present.       Legs:  Skin:    Capillary Refill: Capillary refill takes less than 2 seconds.     Findings: No erythema, lesion or rash.  Neurological:     General: No focal deficit present.     Mental Status: She is oriented to person, place, and time.     Motor: No weakness.  Psychiatric:        Mood and Affect: Mood normal.      ED Treatments / Results  Labs (all labs ordered are listed, but only abnormal results are displayed) Labs Reviewed  CBC WITH DIFFERENTIAL/PLATELET - Abnormal; Notable for the following components:      Result Value   WBC 3.0 (*)    RBC 3.68 (*)    Hemoglobin 8.7 (*)    HCT 31.5 (*)    MCH 23.6 (*)    MCHC 27.6 (*)    RDW 19.1 (*)    Platelets 128 (*)    All other components within normal limits  COMPREHENSIVE METABOLIC PANEL - Abnormal; Notable for the following components:   Potassium 2.9 (*)    Chloride 95 (*)    CO2 33 (*)    BUN 5 (*)    Creatinine, Ser 0.39 (*)    Calcium 7.9 (*)    Total  Protein 6.2 (*)    Albumin 3.4 (*)    All other components within normal limits  BRAIN NATRIURETIC PEPTIDE - Abnormal; Notable for the following components:   B Natriuretic Peptide 336.8 (*)    All other components within normal limits  URINALYSIS, ROUTINE W REFLEX MICROSCOPIC - Abnormal; Notable for the following components:   Ketones, ur 20 (*)    All other components within normal limits  ETHANOL - Abnormal; Notable for the following components:   Alcohol, Ethyl (B) 148 (*)    All other components within normal limits  RAPID URINE DRUG SCREEN, HOSP PERFORMED - Abnormal; Notable for the following components:   Benzodiazepines POSITIVE (*)    All other components within normal  limits  URINE CULTURE  LIPASE, BLOOD  CK  I-STAT TROPONIN, ED    EKG None  Radiology Dg Chest 2 View  Result Date: 04/14/2018 CLINICAL DATA:  Hypoxia, abnormal breath sounds EXAM: CHEST - 2 VIEW COMPARISON:  Chest CT 04/11/2018 FINDINGS: Mild cardiomegaly. No confluent opacities, effusions or edema. No acute bony abnormality. IMPRESSION: Cardiomegaly.  No active disease. Electronically Signed   By: Charlett Nose M.D.   On: 04/14/2018 09:56   Ct Head Wo Contrast  Result Date: 04/14/2018 CLINICAL DATA:  Headache, fall EXAM: CT HEAD WITHOUT CONTRAST TECHNIQUE: Contiguous axial images were obtained from the base of the skull through the vertex without intravenous contrast. COMPARISON:  03/23/2018 FINDINGS: Brain: Chronic small vessel disease changes throughout the deep white matter, stable. No acute intracranial abnormality. Specifically, no hemorrhage, hydrocephalus, mass lesion, acute infarction, or significant intracranial injury. Vascular: No hyperdense vessel or unexpected calcification. Skull: No acute calvarial abnormality. Sinuses/Orbits: No acute finding. Other: None IMPRESSION: Chronic small vessel disease.  No acute intracranial abnormality. Electronically Signed   By: Charlett Nose M.D.   On: 04/14/2018 10:17   Dg Knee Complete 4 Views Left  Result Date: 04/14/2018 CLINICAL DATA:  LEFT knee pain post fall EXAM: LEFT KNEE - COMPLETE 4+ VIEW COMPARISON:  04/09/2018 FINDINGS: Osseous mineralization appears diffusely decreased. Joint spaces preserved. No acute fracture, dislocation, or bone destruction. No knee joint effusion. IMPRESSION: No acute osseous abnormalities. Electronically Signed   By: Ulyses Southward M.D.   On: 04/14/2018 08:34    Procedures Procedures (including critical care time)  Medications Ordered in ED Medications  potassium chloride SA (K-DUR,KLOR-CON) CR tablet 40 mEq (40 mEq Oral Given 04/14/18 1154)  ondansetron (ZOFRAN) injection 4 mg (4 mg Intravenous Given 04/14/18  1145)  ondansetron (ZOFRAN) injection 4 mg (4 mg Intravenous Given 04/14/18 1352)     Initial Impression / Assessment and Plan / ED Course  I have reviewed the triage vital signs and the nursing notes.  Pertinent labs & imaging results that were available during my care of the patient were reviewed by me and considered in my medical decision making (see chart for details).        CLORA OHMER is a 58 y.o. female with a past medical history significant for alcohol abuse, GERD, bipolar disorder, tobacco abuse, dyslipidemia, multiple sclerosis, psoriasis, migraines, COPD with recent admission for hypoxia who presents with left knee pain after a fall.  Patient reports that she has had left knee pain for the last 2 days after a mechanical fall.  She reports that the pain is severe.  She denies other injuries.  She appears clinically intoxicated on my initial evaluation.  Patient reports that she drank "too much" last night.  Patient denies other injury  such as headache, chest pain or back pain.  On my initial evaluation, patient was not on oxygen and was sitting in the bed.  When the oxygen saturation was reassessed, her oxygen saturations were in the 50s.  It was a good waveform at 56%.  Patient was awakened and it still jumped to 60%.  Patient was placed on 2 L nasal cannula and after a period of time her oxygen saturations are now in the 90s.  Suspect hypoxia is related to intoxication and chronic lung disease.  Chart review shows that patient just had an admission for hypoxia including a negative work-up for pulmonary embolism.  Chart review shows that patient was recommended to be on home oxygen but patient was noncompliant with further management.  Patient reported during her discharge of legs ago she was given go to rehab however patient says she has not done so.  On exam, patient has tenderness on her left knee.  No significant tenderness in the calf.  Patient has peripheral edema bilaterally.   Patient is normal sensation, DP pulse, and strength in ankles.  No tenderness of the hips, abdomen or chest.  Patient is somnolent and intoxicated but is arousable to voice.  Patient does have some crackles in her lung bases.  Aside from the knee pain, patient's symptoms appear similar to the hypoxia during her recent mission.  Will get x-ray initially however expect patient will need to talk again with social worker care coordination to figure out a home oxygen plan given her persistent hypoxia here today in the emergency department.  7:58 AM Patient was reassessed by me and says that she has 2 L home oxygen at home which she supposed to take.  As she is now on her home oxygen, her oxygen saturations are on the 90s and she has not having any other complaints.  I asked patient if she would like to be worked up for the leg edema with her recent admission and the hypoxia however patient wants to focus on the knee.  Patient is still intoxicated.  We will continue to let her metabolize and monitor.   9:34 AM X-ray showed no fracture dislocation.  Knee immobilizer ordered.  Orthopedic tech was able to place this but he did not feel she was safe for crutches at this time given her somnolence and intoxication.  I went to reassess patient and she is still arousable to voice but now is complaining that she is having headache after her fall and reports that she does not feel safe managing all of her conditions at home.  She reports he has not taken any of her medicines since discharge including Lasix and her legs do feel more swollen than baseline.  She reports she is having some shortness of breath.   After speaking with the patient again, I feel she needs further work-up to make sure she does not have intracranial injury, assess for her somnolence being only from intoxication, and for what appears to be fluid overload leading to hypoxia in the 50s.  Patient will have head CT, chest x-ray, and labs.  Dr.  Rolan Bucco, director of [facility], called to check on the patient and request that she be reevaluated by social work.  The administrative team of [facility] raise concern the patient has utilized EMS approximately 30 times in the last few months.  They also said that they are concerned about her living situation with her being at home and unable to take  care of herself safely leading to numerous trips to the emergency department.  We will call social work but will also further work-up her medical concerns at this time.  Anticipate reassessment.  11:48 AM Patient reports she is still feeling very nauseous.  When asked why she was unable to go to the rehab facility as they discussed on her discharge several days ago she reports that she was not medically stable for that yet.  When asked why she did not take her diuretics and Lasix over the last several days she reports that she was unable to fill the prescription because she cannot physically get where she needs to go to get them filled and picked up.  Patient reports she is continued to drink alcohol and is feeling "really bad".  Patient will be given more nausea medicine and will be monitored.  Patient's urine appeared extremely dark.  We will check a CK and await urinalysis.  2:23 PM Have spoken multiple times with care coordination and social work at [facility] to try and coordinate patient being given prescriptions from the outpatient pharmacy here at [facility] to help her be treated as an outpatient.  Patient was given a dose of Zofran here.  Patient will be discharged with prescriptions and medications.   2:50 PM Pharmacy was involved and will fill prescriptions for the patient here in the [facility] emergency department.  They are going to assist with the prescriptions.  Patient will be discharged with instructions to continue outpatient medical management as her oxygen is on her home requirement and  otherwise we have not seen new acute changes in her labs aside from hypokalemia which we treated and intoxication what she has metabolized here.  Patient will follow-up with her PCP and the outpatient rehab as previously planned.  Patient understood return precautions and will be discharged after medications are filled.  As patient did not have any money and there was not enough petty cash per pharmacy, money was collected by the emergency team to help pay for the patient's prescriptions.  Patient will be discharged.    Final Clinical Impressions(s) / ED Diagnoses   Final diagnoses:  Acute pain of left knee  Acute alcoholic intoxication in alcoholism without complication (HCC)  Hypokalemia  Nausea  Fall, initial encounter  Peripheral edema  History of medication noncompliance    ED Discharge Orders         Ordered    chlordiazePOXIDE (LIBRIUM) 25 MG capsule     04/14/18 1420    diltiazem (CARDIZEM CD) 120 MG 24 hr capsule  Daily     04/14/18 1420    FLUoxetine (PROZAC) 20 MG capsule  Daily     04/14/18 1420    furosemide (LASIX) 40 MG tablet     04/14/18 1420    gabapentin (NEURONTIN) 100 MG capsule  3 times daily     04/14/18 1420    hydrOXYzine (ATARAX/VISTARIL) 25 MG tablet  Daily PRN     04/14/18 1420    lactulose (CHRONULAC) 10 GM/15ML solution  3 times daily     04/14/18 1420    nitrofurantoin, macrocrystal-monohydrate, (MACROBID) 100 MG capsule  2 times daily     04/14/18 1420    pantoprazole (PROTONIX) 40 MG tablet  Daily     04/14/18 1420    ondansetron (ZOFRAN-ODT) 4 MG disintegrating tablet  Every 8 hours PRN     04/14/18 1420    thiamine 100 MG tablet  Daily  04/14/18 1420    spironolactone (ALDACTONE) 25 MG tablet  Daily     04/14/18 1420          Clinical Impression: 1. Acute pain of left knee   2. Acute alcoholic intoxication in alcoholism without complication (HCC)   3. Hypokalemia   4. Nausea   5. Fall, initial encounter   6. Peripheral edema    7. History of medication noncompliance     Disposition: Discharge  Condition: Good  I have discussed the results, Dx and Tx plan with the pt(& family if present). He/she/they expressed understanding and agree(s) with the plan. Discharge instructions discussed at great length. Strict return precautions discussed and pt &/or family have verbalized understanding of the instructions. No further questions at time of discharge.    Current Discharge Medication List      Follow Up: [facility] [address] [place] [address] [phone] Schedule an appointment as soon as possible for a visit    [facility] [address] [phone] mc [place] [address] [phone]    Your outpatient rehap facility.         , Canary Brim, MD 04/14/18 [phone]

## 2018-04-14 NOTE — ED Notes (Signed)
BLANKET AND GINGER-ALE GIVEN AT DISCHARGE

## 2018-04-14 NOTE — ED Notes (Signed)
Assisted patient with dressing blue paper scrub pants.

## 2018-04-14 NOTE — Discharge Planning (Signed)
[facility] consulted regarding home health services and Rx delivery.  Patient is well known for non compliance,& not answering the door in past with [facility] agencies; [facility] will attempt HH set up again.  EDCM will look into [facility] (meds to bed) and or alternate options Rx delivery for pt.

## 2018-04-14 NOTE — ED Notes (Signed)
PT REQUESTING SOMETHING TO EAT. DECLINES SANDWICH. REQUESTING DINNER TRAY. EDP TEGELER AWARE OF PT'S REQUEST. AWAITING ORDER. PT IS AWARE. DIET GINGER-ALE GIVEN. PT THEN REQUESTED MAC-CHEESE. GIVEN AS REQUESTED.  PT THEN REQUESTING ASSISTANCE WITH FEEDING. PT HOLDING FORK AND ABLE TO FEED SELF. PT ASSISTED TO HIGH-FOWLERS AND UNDERSTOOD SHE WAS ABLE TO FEED HERSELF. PT THEN REQUESTED WE JUST STAY IN THE ROOM. LIMITS SET WITH PT. THIS WAS NOT AN EXPECTATION THE STAFF COULD MEET FOR THIS PT AS WE HAD OTHER PATIENTS TO CARE FOR. PT CONTINUES TO CALL OUT. CHARGE STACEY RN AWARE. ALL STAFF AWARE OF PT PLAN OF CARE. EDP TEGELER AWARE.

## 2018-04-14 NOTE — ED Notes (Signed)
ED Provider at bedside. 

## 2018-04-14 NOTE — ED Triage Notes (Signed)
PT IS ON [facility] HOME CONTINUOUS. PT STATES SHE FELL OUT OF A CHAIR. UNSURE WHY SHE FELL. SHE STATES LOC. PT STATES SHE HAD A BOTTLE OF WINE AND A DRINK THIS AM. PT VERY SLEEPY DURING ASSESSMENT.

## 2018-04-14 NOTE — ED Notes (Signed)
Dx delayed waiting for social work to provide cab voucher and meds given to pt.

## 2018-04-14 NOTE — ED Notes (Signed)
Patient placed on bedpan.

## 2018-04-14 NOTE — ED Notes (Signed)
ASSIST DIRECT WENDY RN SPEAKING WITH PT. AWARE OF PLAN OF CARE ALREADY GIVEN TO THIS PT. EDP TEGELER SPOKE WITH WENDY RN AND GIVEN UPDATE. PT AWARE OF PLAN OF CARE. SOCIAL WORK AND CASE MANAGEMENT ALSO HAVE SEEN THIS PT. PT HAS MULTIPLE TIMES DISMISSED THIS WRITER. PT HAS REFUSED CARE FROM SOCIAL WORK STATING SHE DOES NOT NEED HOME MEDICATION FILLED. PT IS REQUESTING TO BE DISCHARGED. PT ATTEMPTING TO REMOVE LEFT LEG IMMOBILIZER. [facility] RN ASSISTED WITH REMOVING. PT AWARE SHE IS A FALL RISK. PT CONTINUES TO NOT FOLLOW GUIDELINES. \ -

## 2018-04-14 NOTE — Discharge Planning (Signed)
[facility] arranged Rx to pt bed prior to discharge home.  EDCM not able to set up pt with [facility] services as all agencies have declined pt due to past behaviors.

## 2018-04-14 NOTE — ED Notes (Signed)
Patient continues to put her call bell on frequently, asking staff to remove her knee brace. Patient told no because if she fell or hurt her knee more than we would be responsible and if she removed the brace she would be responsible.

## 2018-04-14 NOTE — Progress Notes (Signed)
CSW verified pt has no family/contact support available and provided pt with a taxi voucher.  Please reconsult if future social work needs arise.  CSW signing off, as social work intervention is no longer needed.  Dorothe Pea. Artis Buechele, LCSW, LCAS, CSI Clinical Social Worker Ph: [phone]

## 2018-04-14 NOTE — ED Notes (Signed)
Pt was told she needed O2 for transport home, per Dr Julieanne Manson, staff was arranging PTAR. Pt refused PTAR saying she "doesn't need O2 for the ride home". Pt was offered PTAR as option but refused.  Pt was given a cab voucher to [place], and staff paid for her ordered meds because patient said she didn't bring her purse and can't afford her meds. Pt sts " fine but I have to be at the bank by 5pm, I'm leaving".

## 2018-04-14 NOTE — ED Notes (Signed)
CULTURE OBTAINED

## 2018-04-14 NOTE — ED Notes (Signed)
Patient ringing out multiple times. This RN has answered the call bell x8 times to patient requesting food, to see the md, to see the nurse to speak to a Child psychotherapist, requesting a drink , and requesting more food.

## 2018-04-14 NOTE — ED Notes (Signed)
Ortho made aware of crutches and knee immobilizer ordered.

## 2018-04-14 NOTE — ED Notes (Signed)
PT ENDORSE SHE HAS NOT BEEN TAKING HER LASIX. SHE IS AWARE OF HER REDNESS AND SWELLINGTO HER LOWER LEGS AND FEET. SHE ENDORSES SHE HAS BEEN DRINKING TOO MUCH. PT DENIES NECK AND BACK PAIN. EDP TEGELER AWARE OF THIS WRITER'S CONVERSATION WITH PT AND CURRENT STATUS. NO ADDITIONAL ORDERS GIVEN. WILL CONTINUE TO MONITOR.

## 2018-04-14 NOTE — ED Notes (Signed)
Patient states she wants to leave and asked writer to put her in a wheelchair so she can leave. Patient putting the call bell on every 5-10- seconds.

## 2018-04-14 NOTE — ED Triage Notes (Signed)
Per EMS: Pt fell 2 days ago and states she injured her lt knee.  Called earlier this evening for assistance but was not transported.  Pt frequently calls EMS and isn't always transported.  Pt at baseline.  Pt is known alcoholic but does not appear to be currently intoxicated.

## 2018-04-14 NOTE — ED Notes (Signed)
EDP TEGELER AWARE PT 02 SATS 87% RA. PTAR APPROPRIATE. ASSIST DIRT WENDY RN SPOKE WITH WITH PT AND MADE AWARE OF PT'S NEED FOR OXYGEN 2LNC FOR TRANSPORT HOME AND PTAR WILL TAKE HER HOME. HOME MEDICATIONS HAVE BEEN PAID FOR BY EDP TEGELER AND CHARGE STACEY RN. CAB VOUCHER WILL BE CANCELLED. PT WALKING AROUND IN ROOM WITHOUT OXYGEN. PT DECLINED PTAR TRANSFER. PT WANTS DISCHARGE BY TAXI. PT AWARE NO OXYGEN ON TAXI.

## 2018-04-14 NOTE — ED Notes (Signed)
ORTHO QUINTIN APPLIED IMMOBILIZER. EVALUATED PT. CONCERNED PT WILL NOT COMPLY WITH CRUTCHES. EDP TEGELER MADE AWARE.

## 2018-04-14 NOTE — ED Notes (Addendum)
At 1515 Pt is yelling out of door for help.  Veterinary surgeon went to pt room.  Pt sts she is on a bedpan.  Writer helps pt to get off bedpan.  Writer explained that pt was up for discharge.  Pt sts she is in DT with diarhea.  Five minutes later pt calls out to go to the restroom.  Writer takes pt to the bathroom, pt continually pulls call bell for numerous reasons.  Writer helps pt to clean up, puts cream on her bottom and a brief on pt as well as grip socks, transports pt back in room.  Pt continually argues with everyone.  Sts she needs a doctor.  Pt is placed in bed.  Pt then gets out of bed stands at the door and yells out the door to anyone who walks by.  AD went to room to discuss dx with pt, pt sts she understands but then immediately is at the door yelling out.  RN takes pt a bedside commode to room.  Pt is insist that she needs help but is up walking around.  Pt sts "I want a wheelchair, Im leaving."  Pt informed that if that happens she will not get the meds that she is being given for free nor will she receive a cab voucher.  Pt is yelling she is nauseous and how she doesn't have her oxygen but she keeps taking it off.  Pt's antics are attention seeking and distracting staff for caring for other patients.

## 2018-04-15 LAB — URINE CULTURE

## 2018-04-16 ENCOUNTER — Emergency Department (HOSPITAL_COMMUNITY)
Admission: EM | Admit: 2018-04-16 | Discharge: 2018-04-16 | Disposition: A | Discharge status: Home / Self Care | Payer: No Typology Code available for payment source | Attending: Emergency Medicine | Admitting: Emergency Medicine

## 2018-04-16 ENCOUNTER — Emergency Department (HOSPITAL_COMMUNITY): Payer: No Typology Code available for payment source

## 2018-04-16 ENCOUNTER — Encounter (HOSPITAL_COMMUNITY): Payer: Self-pay

## 2018-04-16 ENCOUNTER — Other Ambulatory Visit: Payer: Self-pay

## 2018-04-16 ENCOUNTER — Emergency Department (HOSPITAL_COMMUNITY)
Admission: EM | Admit: 2018-04-16 | Discharge: 2018-04-17 | Disposition: A | Discharge status: Home / Self Care | Payer: No Typology Code available for payment source | Source: Home / Self Care | Attending: Emergency Medicine | Admitting: Emergency Medicine

## 2018-04-16 DIAGNOSIS — J449 Chronic obstructive pulmonary disease, unspecified: Secondary | ICD-10-CM | POA: Insufficient documentation

## 2018-04-16 DIAGNOSIS — Z79899 Other long term (current) drug therapy: Secondary | ICD-10-CM | POA: Insufficient documentation

## 2018-04-16 DIAGNOSIS — I4891 Unspecified atrial fibrillation: Secondary | ICD-10-CM | POA: Insufficient documentation

## 2018-04-16 DIAGNOSIS — R062 Wheezing: Secondary | ICD-10-CM | POA: Insufficient documentation

## 2018-04-16 DIAGNOSIS — M545 Low back pain, unspecified: Secondary | ICD-10-CM

## 2018-04-16 DIAGNOSIS — F17210 Nicotine dependence, cigarettes, uncomplicated: Secondary | ICD-10-CM | POA: Insufficient documentation

## 2018-04-16 DIAGNOSIS — E876 Hypokalemia: Secondary | ICD-10-CM

## 2018-04-16 DIAGNOSIS — F1020 Alcohol dependence, uncomplicated: Secondary | ICD-10-CM | POA: Diagnosis not present

## 2018-04-16 DIAGNOSIS — M25562 Pain in left knee: Secondary | ICD-10-CM | POA: Diagnosis not present

## 2018-04-16 DIAGNOSIS — J45909 Unspecified asthma, uncomplicated: Secondary | ICD-10-CM | POA: Insufficient documentation

## 2018-04-16 LAB — BASIC METABOLIC PANEL
Anion gap: 14 (ref 5–15)
BUN: <5 mg/dL — ABNORMAL LOW (ref 6–20)
CO2: 34 mmol/L — ABNORMAL HIGH (ref 22–32)
Calcium: 8 mg/dL — ABNORMAL LOW (ref 8.9–10.3)
Chloride: 89 mmol/L — ABNORMAL LOW (ref 98–111)
Creatinine, Ser: 0.58 mg/dL (ref 0.44–1.00)
GFR calc Af Amer: >60 mL/min (ref >60)
GFR calc non Af Amer: >60 mL/min (ref >60)
Glucose, Bld: 94 mg/dL (ref 70–99)
Potassium: 2.8 mmol/L — ABNORMAL LOW (ref 3.5–5.1)
Sodium: 137 mmol/L (ref 135–145)

## 2018-04-16 LAB — CBC
HEMATOCRIT: 34.3 % — AB (ref 36.0–46.0)
Hemoglobin: 9.9 g/dL — ABNORMAL LOW (ref 12.0–15.0)
MCH: 23.1 pg — AB (ref 26.0–34.0)
MCHC: 28.9 g/dL — ABNORMAL LOW (ref 30.0–36.0)
MCV: 80 fL (ref 80.0–100.0)
Platelets: 99 10*3/uL — ABNORMAL LOW (ref 150–400)
RBC: 4.29 MIL/uL (ref 3.87–5.11)
RDW: 19.2 % — ABNORMAL HIGH (ref 11.5–15.5)
WBC: 4 10*3/uL (ref 4.0–10.5)
nRBC: 0 % (ref 0.0–0.2)

## 2018-04-16 LAB — ETHANOL: Alcohol, Ethyl (B): <10 mg/dL (ref <10)

## 2018-04-16 LAB — I-STAT BETA HCG BLOOD, ED (MC, WL, AP ONLY): I-stat hCG, quantitative: <5 m[IU]/mL (ref <5)

## 2018-04-16 LAB — SALICYLATE LEVEL: Salicylate Lvl: <7 mg/dL (ref 2.8–30.0)

## 2018-04-16 LAB — BRAIN NATRIURETIC PEPTIDE: B Natriuretic Peptide: 638.9 pg/mL — ABNORMAL HIGH (ref 0.0–100.0)

## 2018-04-16 LAB — I-STAT TROPONIN, ED: Troponin i, poc: 0.01 ng/mL (ref 0.00–0.08)

## 2018-04-16 LAB — MAGNESIUM: Magnesium: 1.2 mg/dL — ABNORMAL LOW (ref 1.7–2.4)

## 2018-04-16 LAB — ACETAMINOPHEN LEVEL: Acetaminophen (Tylenol), Serum: 10 ug/mL (ref 10–30)

## 2018-04-16 MED ORDER — BARRIER CREAM NON-SPECIFIED
1.0000 "application " | TOPICAL_CREAM | Freq: Two times a day (BID) | TOPICAL | Status: DC | PRN
  Filled 2018-04-16: qty 1

## 2018-04-16 MED ORDER — SODIUM CHLORIDE 0.9 % IV BOLUS
1000.0000 mL | Freq: Once | INTRAVENOUS | Status: AC
  Administered 2018-04-16: 1000 mL via INTRAVENOUS

## 2018-04-16 MED ORDER — SODIUM CHLORIDE 0.9% FLUSH
3.0000 mL | Freq: Once | INTRAVENOUS | Status: AC
  Administered 2018-04-16: 3 mL via INTRAVENOUS

## 2018-04-16 MED ORDER — POTASSIUM CHLORIDE 10 MEQ/100ML IV SOLN
10.0000 meq | INTRAVENOUS | Status: AC
  Administered 2018-04-16 – 2018-04-17 (×2): 10 meq via INTRAVENOUS
  Filled 2018-04-16 (×2): qty 100

## 2018-04-16 MED ORDER — FUROSEMIDE 10 MG/ML IJ SOLN
40.0000 mg | Freq: Once | INTRAMUSCULAR | Status: AC
  Administered 2018-04-17: 40 mg via INTRAVENOUS
  Filled 2018-04-16: qty 4

## 2018-04-16 MED ORDER — METOPROLOL TARTRATE 5 MG/5ML IV SOLN: 5.0000 mg | INTRAVENOUS | Status: DC | PRN

## 2018-04-16 MED ORDER — LEVALBUTEROL HCL 1.25 MG/0.5ML IN NEBU
1.2500 mg | INHALATION_SOLUTION | Freq: Once | RESPIRATORY_TRACT | Status: AC
  Administered 2018-04-16: 1.25 mg via RESPIRATORY_TRACT
  Filled 2018-04-16: qty 0.5

## 2018-04-16 MED ORDER — CHLORDIAZEPOXIDE HCL 25 MG PO CAPS
50.0000 mg | ORAL_CAPSULE | Freq: Once | ORAL | Status: AC
  Administered 2018-04-17: 50 mg via ORAL
  Filled 2018-04-16: qty 2

## 2018-04-16 MED ORDER — POTASSIUM CHLORIDE CRYS ER 20 MEQ PO TBCR
40.0000 meq | EXTENDED_RELEASE_TABLET | Freq: Once | ORAL | Status: AC
  Administered 2018-04-16: 40 meq via ORAL
  Filled 2018-04-16: qty 2

## 2018-04-16 MED ORDER — ONDANSETRON HCL 4 MG/2ML IJ SOLN
4.0000 mg | Freq: Once | INTRAMUSCULAR | Status: AC
  Administered 2018-04-17: 4 mg via INTRAVENOUS
  Filled 2018-04-16: qty 2

## 2018-04-16 MED ORDER — ACETAMINOPHEN 325 MG PO TABS
650.0000 mg | ORAL_TABLET | Freq: Once | ORAL | Status: AC
  Administered 2018-04-16: 650 mg via ORAL
  Filled 2018-04-16: qty 2

## 2018-04-16 MED ORDER — KETOROLAC TROMETHAMINE 30 MG/ML IJ SOLN
30.0000 mg | Freq: Once | INTRAMUSCULAR | Status: AC
  Administered 2018-04-17: 30 mg via INTRAVENOUS
  Filled 2018-04-16: qty 1

## 2018-04-16 NOTE — ED Notes (Signed)
Pt very argumentative and not cooperative with staff.  Royce RN came to explain to patient her plan of care.  That the best treatment for her was the tylenol, and she has to go to home via PTAR due to being on oxygen and can't take a cab.

## 2018-04-16 NOTE — ED Triage Notes (Signed)
Pt is coming from home. Pt is on 2l [place] at home and is at 99% at this time. Pt reports she fell yesterday and has back pain today. Pt has a hx of ETOH abuse but denies that she has drank today.Pt urinated intentionally on herself on the way here per EMS.

## 2018-04-16 NOTE — ED Notes (Signed)
Bed: [facility] Expected date:  Expected time:  Means of arrival:  Comments: EMS/back pain/ETOH

## 2018-04-16 NOTE — ED Provider Notes (Signed)
[facility] EMERGENCY DEPARTMENT Provider Note   CSN: [id number] Arrival date & time: 04/16/18  2123    History   Chief Complaint Chief Complaint  Patient presents with  . Atrial Fibrillation    HPI Anne Black is a 58 y.o. female with a history of COPD, severe alcohol use disorder, multiple sclerosis, ADHD, anxiety, bipolar disorder, arthritis, and narcolepsy who presents to the emergency department by EMS with a chief complaint of back pain.  The patient reports that she lost her balance and fell backwards last night.  She states that she hit her mid back and her low knee in the fall.  She reports that she is also concerned that she is detoxing from alcohol.  She reports that she drinks 4 to 5 glasses of wine daily and has not had a drink in the last 24 hours.  She is unsure what time her last drink was last night.  She reports that the last day that she went without drinking was in December when she was then rehab for 30 days.  She reports associated nausea and headache.  She reports she has been having loose stools.  They have not been watery.  No melena or hematochezia.  EMS reports after they arrived the patient was found to be in A. fib with RVR, but then continue to transition from A. fib to sinus tachycardia, and then back to normal sinus rhythm, and then back to A. fib with RVR while in route.  She denies fever, chills, chest pain, abdominal pain, constipation, rash, numbness, or weakness.  She wears 2 L of home O2, which is currently in place.  She has not been using any home albuterol nebulizers.  She also reports that her bilateral lower extremities have been swelling, but she has not been taking her home Lasix and "is not always good about remembering to take it.  I do not follow-up with primary care like you should.  I need to take care of myself."  She is a former Designer, television/film set.     The history is provided by the patient. No language interpreter  was used.    Past Medical History:  Diagnosis Date  . ADHD (attention deficit hyperactivity disorder)   . Alcoholism (HCC)   . Anxiety   . Arthritis   . Ascites   . Asthma   . Bipolar disorder (HCC)   . Cirrhosis (HCC)   . COPD (chronic obstructive pulmonary disease) (HCC)   . Dyspnea   . Dysrhythmia   . History of hiatal hernia   . Hypokalemia   . Migraine   . Multiple sclerosis (HCC)   . Narcolepsy   . Osteoporosis   . Pneumonia     Patient Active Problem List   Diagnosis Date Noted  . Chronic hypoxemic respiratory failure (HCC) 04/11/2018  . Atrial fibrillation with rapid ventricular response (HCC) 03/23/2018  . Head injury   . Fall 03/19/2018  . Syncope 03/19/2018  . Orbital fracture 03/19/2018  . Scalp laceration 03/19/2018  . Acute respiratory failure with hypoxia and hypercarbia (HCC) 03/19/2018  . Abdominal tenderness 03/19/2018  . Acute on chronic respiratory failure with hypoxia (HCC) 03/17/2018  . Alcohol abuse with intoxication (HCC) 03/17/2018  . ETOH abuse   . Normocytic anemia 03/16/2018  . Acute metabolic encephalopathy 03/16/2018  . Lower leg edema 03/16/2018  . Migraine 03/16/2018  . A-fib (HCC) 02/18/2018  . Community acquired pneumonia of left lower lobe of lung (HCC)   .  Weakness 08/21/2017  . COPD exacerbation (HCC) 06/12/2017  . Chest pain with moderate risk for cardiac etiology 05/30/2017  . Abnormal LFTs 05/30/2017  . Tachycardia 05/30/2017  . COPD (chronic obstructive pulmonary disease) (HCC) 04/19/2017  . Right rib fracture 04/15/2017  . Abdominal wall cellulitis 12/30/2016  . S/P exploratory laparotomy 12/30/2016  . Distal radius fracture, left 09/09/2016  . Thrombocytopenia (HCC) 09/09/2016  . Chest x-ray abnormality   . Umbilical hernia without obstruction and without gangrene 05/12/2016  . Itching 02/24/2016  . Ventral hernia without obstruction or gangrene 02/24/2016  . Palliative care encounter   . Ascites   . Tachypnea     . Atrial flutter with rapid ventricular response (HCC)   . Hypomagnesemia   . Hypoxia   . Dehydration 01/22/2016  . Low back ache 12/30/2015  . Non-intractable vomiting with nausea   . Alcohol abuse with alcohol-induced mood disorder (HCC) 12/15/2015  . Acute alcoholism (HCC)   . Abdominal distension 11/04/2015  . Alcohol abuse   . Dyspnea   . Acute respiratory failure (HCC) 10/15/2015  . Ascites due to alcoholic cirrhosis (HCC) 10/15/2015  . Osteoporosis   . Multiple sclerosis (HCC)   . Alcoholic cirrhosis of liver with ascites (HCC) 08/22/2015  . Bipolar disorder, current episode mixed, moderate (HCC) 08/01/2015  . Alcohol use disorder, severe, dependence (HCC) 07/31/2015  . Macrocytic anemia- due to alcohol abuse with normal B12 & folate levels 05/31/2015  . Severe protein-calorie malnutrition (HCC) 05/31/2015  . Hypokalemia 05/26/2015  . Encephalopathy, hepatic (HCC) 05/26/2015  . Alcohol withdrawal (HCC) 05/18/2015  . Abdominal pain 05/18/2015  . Anxiety 05/18/2015  . Severe sepsis (HCC) 05/18/2015  . UTI (lower urinary tract infection) 05/18/2015  . Stimulant abuse (HCC) 12/27/2014  . Nicotine abuse 12/27/2014  . Hyperprolactinemia (HCC) 11/15/2014  . Dyslipidemia   . Alcohol use disorder, moderate, dependence (HCC) 10/30/2014  . Tobacco abuse 01/23/2014  . Bipolar 1 disorder (HCC) 04/07/2013  . Noncompliance with therapeutic plan 04/04/2013  . Hereditary and idiopathic peripheral neuropathy 05/01/2012  . Depression 05/01/2012  . GERD (gastroesophageal reflux disease) 05/01/2012  . Osteoarthrosis, unspecified whether generalized or localized, involving lower leg 05/01/2012  . Hyponatremia 07/01/2011    Past Surgical History:  Procedure Laterality Date  . ABDOMINAL WALL DEFECT REPAIR N/A 12/30/2016   Procedure: EXPLORATORY LAPAROTOMY WITH REPAIR ABDOMINAL WALL VENTRAL HERNIA;  Surgeon: [name], MD;  Location: [facility] OR;  Service: General;  Laterality: N/A;   . APPLICATION OF WOUND VAC N/A 12/30/2016   Procedure: APPLICATION OF WOUND VAC;  Surgeon: [name], MD;  Location[facility] OR;  Service: General;  Laterality: N/A;  . CESAREAN SECTION  [phone]  . FRACTURE SURGERY    . HERNIA REPAIR    . MYRINGOTOMY WITH TUBE PLACEMENT Bilateral   . ORIF WRIST FRACTURE Left 09/09/2016   Procedure: OPEN REDUCTION INTERNAL FIXATION (ORIF) LEFT WRIST FRACTURE, LEFT CARPAL TUNNEL RELEASE;  Surgeon: Dominica Severin, MD;  Location: [facility] ORS;  Service: Orthopedics;  Laterality: Left;  . TONSILLECTOMY       OB History   No obstetric history on file.      Home Medications    Prior to Admission medications   Medication Sig Start Date End Date Taking? Authorizing Provider  pantoprazole (PROTONIX) 40 MG tablet Take 1 tablet (40 mg total) by mouth daily for 30 days. 04/14/18 05/14/18 Yes Tegeler, Canary Brim, MD  acetaminophen (TYLENOL) 500 MG tablet Take 1 tablet (500 mg total) by mouth every 6 (six) hours  as needed for mild pain. Patient taking differently: Take 1,000 mg by mouth every 6 (six) hours as needed for mild pain.  03/28/18   Rolly Salter, MD  aluminum-magnesium hydroxide-simethicone (MAALOX) 200-200-20 MG/5ML SUSP Take 30 mLs by mouth 4 (four) times daily as needed (indigestion).     [provider]  chlordiazePOXIDE (LIBRIUM) 25 MG capsule  PO TID x 1D, then 25-50mg  PO BID X 1D, then 25-50mg  PO QD X 1D 04/17/18   ,  A, PA-C  diltiazem (CARDIZEM CD) 120 MG 24 hr capsule Take 1 capsule (120 mg total) by mouth daily for 30 days. 04/14/18 05/14/18  Tegeler, Canary Brim, MD  FLUoxetine (PROZAC) 20 MG capsule Take 1 capsule (20 mg total) by mouth daily for 30 doses. 04/14/18 05/14/18  Tegeler, Canary Brim, MD  Fluticasone-Salmeterol [facility] INHUB) 250-50 MCG/DOSE AEPB Inhale 1 puff into the lungs 2 (two) times daily.    [provider]  folic acid (FOLVITE) 1 MG tablet Take 1 tablet (1 mg total) by mouth daily for 30 days.  04/12/18 05/12/18  Dimple Nanas, MD  furosemide (LASIX) 40 MG tablet Take 40 mg twice daily till 04/02/2018, after that take 40 mg daily. 04/14/18   Tegeler, Canary Brim, MD  gabapentin (NEURONTIN) 100 MG capsule Take 2 capsules (200 mg total) by mouth 3 (three) times daily for 30 days. 04/14/18 05/14/18  Tegeler, Canary Brim, MD  hydrocortisone cream 1 % Apply topically 3 (three) times daily. Patient not taking: Reported on 04/03/2018 03/28/18   Rolly Salter, MD  hydrOXYzine (ATARAX/VISTARIL) 25 MG tablet Take 1 tablet (25 mg total) by mouth daily as needed for up to 30 doses (anxiety). 04/14/18   Tegeler, Canary Brim, MD  lactulose (CHRONULAC) 10 GM/15ML solution Take 45 mLs (30 g total) by mouth 3 (three) times daily for 30 days. 04/14/18 05/14/18  Tegeler, Canary Brim, MD  lidocaine (LIDODERM) 5 % Place 1 patch onto the skin daily. Remove & Discard patch within 12 hours or as directed by MD 04/09/18   Nicanor Alcon, April, MD  magnesium chloride (SLOW-MAG) 64 MG TBEC SR tablet Take 1 tablet (64 mg total) by mouth 2 (two) times daily for 5 days. 04/17/18 04/22/18  ,  A, PA-C  nicotine (NICODERM CQ - DOSED IN MG/24 HOURS) 21 mg/24hr patch Place 1 patch (21 mg total) onto the skin daily. 03/29/18   Rolly Salter, MD  nitrofurantoin, macrocrystal-monohydrate, (MACROBID) 100 MG capsule Take 1 capsule (100 mg total) by mouth 2 (two) times daily for 5 days. 04/14/18 04/19/18  Tegeler, Canary Brim, MD  ondansetron (ZOFRAN-ODT) 4 MG disintegrating tablet Take 1 tablet (4 mg total) by mouth every 8 (eight) hours as needed for nausea or vomiting. 04/14/18   Tegeler, Canary Brim, MD  potassium chloride SA (K-DUR,KLOR-CON) 20 MEQ tablet Take 1 tablet (20 mEq total) by mouth 2 (two) times daily for 5 days. 04/17/18 04/22/18  ,  A, PA-C  predniSONE (DELTASONE) 20 MG tablet 3 tabs po day one, then 2 po daily x 4 days 04/09/18   Palumbo, April, MD  promethazine (PHENERGAN) 25 MG tablet Take 1 tablet (25 mg  total) by mouth every 6 (six) hours as needed for nausea or vomiting. 03/17/18   Clydie Braun, MD  rizatriptan (MAXALT) 10 MG tablet Take 10 mg by mouth as needed for migraine. May repeat in 2 hours if needed    [provider]  spironolactone (ALDACTONE) 25 MG tablet Take 1 tablet (25  mg total) by mouth daily for 30 days. 04/14/18 05/14/18  Tegeler, Canary Brim, MD  thiamine 100 MG tablet Take 1 tablet (100 mg total) by mouth daily for 30 days. 04/14/18 05/14/18  Tegeler, Canary Brim, MD  VENTOLIN HFA 108 (90 Base) MCG/ACT inhaler Inhale 2 puffs into the lungs every 4 (four) hours as needed for wheezing or shortness of breath. 03/16/18   Virgina Norfolk, DO    Family History Family History  Problem Relation Age of Onset  . Arrhythmia Mother   . Heart disease Father   . Hypertension Father     Social History Social History   Tobacco Use  . Smoking status: Current Every Day Smoker    Packs/day: 1.00    Years: 35.00    Pack years: 35.00    Types: Cigarettes  . Smokeless tobacco: Never Used  . Tobacco comment: Pt states smoked this morning  Substance Use Topics  . Alcohol use: Yes    Comment: 1 bottle of wine per day per patient.   . Drug use: No     Allergies   Patient has no known allergies.   Review of Systems Review of Systems  Constitutional: Negative for activity change, chills and fever.  HENT: Negative for congestion, sinus pressure and sinus pain.   Respiratory: Positive for wheezing. Negative for cough and shortness of breath.   Cardiovascular: Positive for leg swelling. Negative for chest pain and palpitations.  Gastrointestinal: Positive for diarrhea and nausea. Negative for abdominal pain, constipation and vomiting.  Genitourinary: Negative for dysuria, urgency, vaginal bleeding, vaginal discharge and vaginal pain.  Musculoskeletal: Positive for arthralgias, back pain and myalgias. Negative for joint swelling, neck pain and neck stiffness.  Skin: Negative  for rash.  Allergic/Immunologic: Negative for immunocompromised state.  Neurological: Negative for weakness, numbness and headaches.  Psychiatric/Behavioral: Negative for confusion.   Physical Exam Updated Vital Signs BP (!) 112/51   Pulse 85   Temp 98.9 F (37.2 C) (Oral)   Resp 16   SpO2 94%   Physical Exam Vitals signs and nursing note reviewed.  Constitutional:      General: She is not in acute distress.    Appearance: She is not ill-appearing, toxic-appearing or diaphoretic.     Comments: Appears much older than stated age.  Disheveled.  Chronically ill-appearing female.  Nasal cannula in place.  HENT:     Head: Normocephalic.  Eyes:     Conjunctiva/sclera: Conjunctivae normal.  Neck:     Musculoskeletal: Neck supple.  Cardiovascular:     Rate and Rhythm: Tachycardia present. Rhythm irregularly irregular.     Heart sounds: No murmur. No friction rub. No gallop.   Pulmonary:     Effort: Pulmonary effort is normal. No respiratory distress.     Breath sounds: No stridor. Wheezing present. No rhonchi or rales.     Comments: Inspiratory and expiratory wheezes in all fields bilaterally. Abdominal:     General: There is no distension.     Palpations: Abdomen is soft. There is no mass.     Tenderness: There is no abdominal tenderness. There is no right CVA tenderness, left CVA tenderness, guarding or rebound.     Hernia: No hernia is present.  Musculoskeletal:     Comments: The cane patch is in place over the midline of the thoracic spine and a second patch is in place over the left knee.  2+ pitting edema to the bilateral lower extremities there is some surrounding erythema to the bilateral  lower extremities, but no warmth in the area is not well demarcated.  Skin:    General: Skin is warm.     Findings: No rash.  Neurological:     Mental Status: She is alert.  Psychiatric:        Behavior: Behavior normal.      ED Treatments / Results  Labs (all labs ordered are  listed, but only abnormal results are displayed) Labs Reviewed  BASIC METABOLIC PANEL - Abnormal; Notable for the following components:      Result Value   Potassium 2.8 (*)    Chloride 89 (*)    CO2 34 (*)    BUN <5 (*)    Calcium 8.0 (*)    All other components within normal limits  CBC - Abnormal; Notable for the following components:   Hemoglobin 9.9 (*)    HCT 34.3 (*)    MCH 23.1 (*)    MCHC 28.9 (*)    RDW 19.2 (*)    Platelets 99 (*)    All other components within normal limits  MAGNESIUM - Abnormal; Notable for the following components:   Magnesium 1.2 (*)    All other components within normal limits  BRAIN NATRIURETIC PEPTIDE - Abnormal; Notable for the following components:   B Natriuretic Peptide 638.9 (*)    All other components within normal limits  ETHANOL  ACETAMINOPHEN LEVEL  SALICYLATE LEVEL  I-STAT BETA HCG BLOOD, ED ([facility], [facility], [facility] ONLY)  I-STAT TROPONIN, ED    EKG None  Radiology Dg Thoracic Spine 2 View  Result Date: 04/17/2018 CLINICAL DATA:  Pain after fall. EXAM: THORACIC SPINE 2 VIEWS COMPARISON:  04/14/2018 CXR FINDINGS: There is no evidence of thoracic spine fracture. Alignment is normal. Mild multilevel disc space narrowing and minimal endplate spurring is seen. No other significant bone abnormalities are identified. IMPRESSION: Mild degenerative change along the thoracic spine without acute osseous appearing abnormality. Electronically Signed   By: Tollie Eth M.D.   On: 04/17/2018 00:55   Dg Chest Portable 1 View  Result Date: 04/16/2018 CLINICAL DATA:  Chest pain EXAM: PORTABLE CHEST 1 VIEW COMPARISON:  04/14/2018 FINDINGS: Stable cardiomegaly. Minimal aortic atherosclerosis without aneurysm. No acute pulmonary consolidation, effusion or pneumothorax. Minimal left basilar atelectasis. No acute nor suspicious osseous abnormalities. IMPRESSION: Stable cardiomegaly without active pulmonary disease. Electronically Signed   By: Tollie Eth M.D.   On:  04/16/2018 22:30    Procedures Procedures (including critical care time)  Medications Ordered in ED Medications  sodium chloride flush (NS) 0.9 % injection 3 mL (3 mLs Intravenous Given 04/16/18 2317)  sodium chloride 0.9 % bolus 1,000 mL (0 mLs Intravenous Stopped 04/17/18 0049)  levalbuterol (XOPENEX) nebulizer solution 1.25 mg (1.25 mg Nebulization Given 04/16/18 2314)  potassium chloride 10 mEq in 100 mL IVPB (0 mEq Intravenous Stopped 04/17/18 0021)  potassium chloride SA (K-DUR,KLOR-CON) CR tablet 40 mEq (40 mEq Oral Given 04/16/18 2314)  ondansetron (ZOFRAN) injection 4 mg (4 mg Intravenous Given 04/17/18 0108)  ketorolac (TORADOL) 30 MG/ML injection 30 mg (30 mg Intravenous Given 04/17/18 0006)  furosemide (LASIX) injection 40 mg (40 mg Intravenous Given 04/17/18 0006)  chlordiazePOXIDE (LIBRIUM) capsule 50 mg (50 mg Oral Given 04/17/18 0006)     Initial Impression / Assessment and Plan / ED Course  I have reviewed the triage vital signs and the nursing notes.  Pertinent labs & imaging results that were available during my care of the patient were reviewed by me and considered  in my medical decision making (see chart for details).        58 year old female with a history of COPD, severe alcohol use disorder, multiple sclerosis, ADHD, anxiety, bipolar disorder, arthritis, and narcolepsy who presents to the emergency department by EMS with a chief complaint of back pain.  Of note, the patient was seen for the same it was a long ER and was discharged at 19:06 and arrived at [facility] at 20:28.  He is well-known to this emergency department with 19 visits in the last 6 months, including 6 visits in the last 9 days with 6 admissions.  EMS notes the patient appeared to be an A. fib with RVR, sinus tachycardia, and normal sinus rhythm with intermittent changing between these rhythms in route to the ER.   EKG was sinus tachycardia, but the patient appears to be in A. fib with RVR on the monitor.  She  was given a dose of metoprolol and remained in normal sinus rhythm without tachycardia.  On exam, she is wheezing in the bilateral lungs in all fields.  Xopenex given to avoid tachycardia with albuterol treatments.  She reports that she fell last night and is having complaints of left knee pain and thoracic spine pain.  2 lidocaine patches were found on the patient during my exam.  They have been removed by nursing staff.  Will order imaging of thoracic spine and left knee despite the patient recently having x-ray of the left knee 3 days ago since she has had a recurrent fall.  After the patient arrives in radiology, radiology tech informs me that the patient is declining left knee x-ray, which has been canceled.  Thoracic spine x-ray is unremarkable.  Labs are notable for BNP of 639, magnesium of 1.2, and potassium of 2.8.  She is hypochloremic with elevated CO2.  Suspect hypochloremia secondary to chronic alcohol use.  Hemoglobin is stable at 9.9.  Chest x-ray is unremarkable.  She has a thrombocytopenia with platelets at 99,000, but this is chronic.  Likely also secondary to chronic alcohol use.  Potassium and magnesium were replenished in the ER and I will send her home with Rx's of the same.  She has requested Tylenol 3 for pain, but was administered Toradol for pain control and started on Librium.  CIWA score was 6.  Vital signs and clinical appearance do not suggest withdrawal although the patient insists that she is.  I do not feel that the patient warrants admission criteria, but I do feel that she needs to follow-up with her primary care team. This information has also been discussed with the patient. I have urged her to remain compliant with her home medications.   At this time, feel that she is hemodynamically stable and in no acute distress.  Safe for discharge to home with outpatient follow-up at this time.  Final Clinical Impressions(s) / ED Diagnoses   Final diagnoses:  Atrial fibrillation  with rapid ventricular response (HCC)  Hypokalemia  Hypomagnesemia  Severe alcohol use disorder [facility])    ED Discharge Orders         Ordered    chlordiazePOXIDE (LIBRIUM) 25 MG capsule     04/17/18 0123    potassium chloride SA (K-DUR,KLOR-CON) 20 MEQ tablet  2 times daily     04/17/18 0123    magnesium chloride (SLOW-MAG) 64 MG TBEC SR tablet  2 times daily     04/17/18 0123           ,   A, PA-C 04/17/18 0814    Melene Plan, DO 04/18/18 1103

## 2018-04-16 NOTE — ED Notes (Signed)
Pt. Called out asking to speak with charge nurse.

## 2018-04-16 NOTE — ED Notes (Signed)
[facility] arrived for patient 

## 2018-04-16 NOTE — ED Triage Notes (Signed)
Pt comes via [facility] for afib RVR, originally called out for back pain due to a fall that happened yesterday. Pt has been going from afib to ST to NSR and then back to afib RVR with EMS. C/o of CP and nausea, PTA recievd 4mg  zofran

## 2018-04-16 NOTE — Discharge Instructions (Signed)
Follow-up with your primary care doctor for further care treatment as needed.  Use Tylenol for pain.  Take all of your medications as directed.

## 2018-04-16 NOTE — ED Notes (Signed)
[facility] called for transport.  

## 2018-04-16 NOTE — ED Provider Notes (Signed)
[facility]-EMERGENCY DEPT Provider Note   CSN: [id number] Arrival date & time: 04/16/18  1637    History   Chief Complaint Chief Complaint  Patient presents with  . Fall  . Alcohol Intoxication  . Back Pain    HPI Anne Black is a 58 y.o. female.     HPI   She presents by EMS for evaluation of back pain.  She states the back pain started when she fell yesterday.  During the EMS transport, they noticed that the patient "intentionally urinated on herself."  Patient reports to me that she was incontinent.  She reports that she is taking antibiotic currently to treat UTI.  She reports that the urinary incontinence is ongoing.  Patient was in the ED, 2 days ago and had an extensive evaluation.  She was given both morning and medication to help her.  Patient reports that she also has left knee pain today from a fall and she reports that she has not has her, which was given to her, 2 days ago prior to discharge from the ED.  The patient wants to be discharged back to her home, and has no further complaints.  She denies drinking alcohol today.  She denies headache or neck pain.  There are no other known modifying factors.  Past Medical History:  Diagnosis Date  . ADHD (attention deficit hyperactivity disorder)   . Alcoholism (HCC)   . Anxiety   . Arthritis   . Ascites   . Asthma   . Bipolar disorder (HCC)   . Cirrhosis (HCC)   . COPD (chronic obstructive pulmonary disease) (HCC)   . Dyspnea   . Dysrhythmia   . History of hiatal hernia   . Hypokalemia   . Migraine   . Multiple sclerosis (HCC)   . Narcolepsy   . Osteoporosis   . Pneumonia     Patient Active Problem List   Diagnosis Date Noted  . Chronic hypoxemic respiratory failure (HCC) 04/11/2018  . Atrial fibrillation with rapid ventricular response (HCC) 03/23/2018  . Head injury   . Fall 03/19/2018  . Syncope 03/19/2018  . Orbital fracture 03/19/2018  . Scalp laceration 03/19/2018  . Acute  respiratory failure with hypoxia and hypercarbia (HCC) 03/19/2018  . Abdominal tenderness 03/19/2018  . Acute on chronic respiratory failure with hypoxia (HCC) 03/17/2018  . Alcohol abuse with intoxication (HCC) 03/17/2018  . ETOH abuse   . Normocytic anemia 03/16/2018  . Acute metabolic encephalopathy 03/16/2018  . Lower leg edema 03/16/2018  . Migraine 03/16/2018  . A-fib (HCC) 02/18/2018  . Community acquired pneumonia of left lower lobe of lung (HCC)   . Weakness 08/21/2017  . COPD exacerbation (HCC) 06/12/2017  . Chest pain with moderate risk for cardiac etiology 05/30/2017  . Abnormal LFTs 05/30/2017  . Tachycardia 05/30/2017  . COPD (chronic obstructive pulmonary disease) (HCC) 04/19/2017  . Right rib fracture 04/15/2017  . Abdominal wall cellulitis 12/30/2016  . S/P exploratory laparotomy 12/30/2016  . Distal radius fracture, left 09/09/2016  . Thrombocytopenia (HCC) 09/09/2016  . Chest x-ray abnormality   . Umbilical hernia without obstruction and without gangrene 05/12/2016  . Itching 02/24/2016  . Ventral hernia without obstruction or gangrene 02/24/2016  . Palliative care encounter   . Ascites   . Tachypnea   . Atrial flutter with rapid ventricular response (HCC)   . Hypomagnesemia   . Hypoxia   . Dehydration 01/22/2016  . Low back ache 12/30/2015  . Non-intractable vomiting with  nausea   . Alcohol abuse with alcohol-induced mood disorder (HCC) 12/15/2015  . Acute alcoholism (HCC)   . Abdominal distension 11/04/2015  . Alcohol abuse   . Dyspnea   . Acute respiratory failure (HCC) 10/15/2015  . Ascites due to alcoholic cirrhosis (HCC) 10/15/2015  . Osteoporosis   . Multiple sclerosis (HCC)   . Alcoholic cirrhosis of liver with ascites (HCC) 08/22/2015  . Bipolar disorder, current episode mixed, moderate (HCC) 08/01/2015  . Alcohol use disorder, severe, dependence (HCC) 07/31/2015  . Macrocytic anemia- due to alcohol abuse with normal B12 & folate levels  05/31/2015  . Severe protein-calorie malnutrition (HCC) 05/31/2015  . Hypokalemia 05/26/2015  . Encephalopathy, hepatic (HCC) 05/26/2015  . Alcohol withdrawal (HCC) 05/18/2015  . Abdominal pain 05/18/2015  . Anxiety 05/18/2015  . Severe sepsis (HCC) 05/18/2015  . UTI (lower urinary tract infection) 05/18/2015  . Stimulant abuse (HCC) 12/27/2014  . Nicotine abuse 12/27/2014  . Hyperprolactinemia (HCC) 11/15/2014  . Dyslipidemia   . Alcohol use disorder, moderate, dependence (HCC) 10/30/2014  . Tobacco abuse 01/23/2014  . Bipolar 1 disorder (HCC) 04/07/2013  . Noncompliance with therapeutic plan 04/04/2013  . Hereditary and idiopathic peripheral neuropathy 05/01/2012  . Depression 05/01/2012  . GERD (gastroesophageal reflux disease) 05/01/2012  . Osteoarthrosis, unspecified whether generalized or localized, involving lower leg 05/01/2012  . Hyponatremia 07/01/2011    Past Surgical History:  Procedure Laterality Date  . ABDOMINAL WALL DEFECT REPAIR N/A 12/30/2016   Procedure: EXPLORATORY LAPAROTOMY WITH REPAIR ABDOMINAL WALL VENTRAL HERNIA;  Surgeon: [name], MD;  Location: [facility] OR;  Service: General;  Laterality: N/A;  . APPLICATION OF WOUND VAC N/A 12/30/2016   Procedure: APPLICATION OF WOUND VAC;  Surgeon: [name], MD;  Location: [facility] OR;  Service: General;  Laterality: N/A;  . CESAREAN SECTION  [phone]  . FRACTURE SURGERY    . HERNIA REPAIR    . MYRINGOTOMY WITH TUBE PLACEMENT Bilateral   . ORIF WRIST FRACTURE Left 09/09/2016   Procedure: OPEN REDUCTION INTERNAL FIXATION (ORIF) LEFT WRIST FRACTURE, LEFT CARPAL TUNNEL RELEASE;  Surgeon: Dominica Severin, MD;  Location: [facility] ORS;  Service: Orthopedics;  Laterality: Left;  . TONSILLECTOMY       OB History   No obstetric history on file.      Home Medications    Prior to Admission medications   Medication Sig Start Date End Date Taking? Authorizing Provider  acetaminophen (TYLENOL) 500 MG tablet Take 1  tablet (500 mg total) by mouth every 6 (six) hours as needed for mild pain. Patient taking differently: Take 1,000 mg by mouth every 6 (six) hours as needed for mild pain.  03/28/18   Rolly Salter, MD  aluminum-magnesium hydroxide-simethicone (MAALOX) 200-200-20 MG/5ML SUSP Take 30 mLs by mouth 4 (four) times daily as needed (indigestion).     [provider]  chlordiazePOXIDE (LIBRIUM) 25 MG capsule Take 1 capsule (25 mg total) by mouth 3 (three) times daily for 1 day, THEN 1 capsule (25 mg total) 2 (two) times daily for 1 day, THEN 1 capsule (25 mg total) daily for 1 day. 04/14/18 04/17/18  Tegeler, Canary Brim, MD  diltiazem (CARDIZEM CD) 120 MG 24 hr capsule Take 1 capsule (120 mg total) by mouth daily for 30 days. 04/14/18 05/14/18  Tegeler, Canary Brim, MD  FLUoxetine (PROZAC) 20 MG capsule Take 1 capsule (20 mg total) by mouth daily for 30 doses. 04/14/18 05/14/18  Tegeler, Canary Brim, MD  Fluticasone-Salmeterol [facility] INHUB) 250-50 MCG/DOSE AEPB Inhale 1  puff into the lungs 2 (two) times daily.    [provider]  folic acid (FOLVITE) 1 MG tablet Take 1 tablet (1 mg total) by mouth daily for 30 days. 04/12/18 05/12/18  Dimple Nanas, MD  furosemide (LASIX) 40 MG tablet Take 40 mg twice daily till 04/02/2018, after that take 40 mg daily. 04/14/18   Tegeler, Canary Brim, MD  gabapentin (NEURONTIN) 100 MG capsule Take 2 capsules (200 mg total) by mouth 3 (three) times daily for 30 days. 04/14/18 05/14/18  Tegeler, Canary Brim, MD  hydrocortisone cream 1 % Apply topically 3 (three) times daily. Patient not taking: Reported on 04/03/2018 03/28/18   Rolly Salter, MD  hydrOXYzine (ATARAX/VISTARIL) 25 MG tablet Take 1 tablet (25 mg total) by mouth daily as needed for up to 30 doses (anxiety). 04/14/18   Tegeler, Canary Brim, MD  lactulose (CHRONULAC) 10 GM/15ML solution Take 45 mLs (30 g total) by mouth 3 (three) times daily for 30 days. 04/14/18 05/14/18  Tegeler, Canary Brim, MD    lidocaine (LIDODERM) 5 % Place 1 patch onto the skin daily. Remove & Discard patch within 12 hours or as directed by MD 04/09/18   Nicanor Alcon, April, MD  nicotine (NICODERM CQ - DOSED IN MG/24 HOURS) 21 mg/24hr patch Place 1 patch (21 mg total) onto the skin daily. 03/29/18   Rolly Salter, MD  nitrofurantoin, macrocrystal-monohydrate, (MACROBID) 100 MG capsule Take 1 capsule (100 mg total) by mouth 2 (two) times daily for 5 days. 04/14/18 04/19/18  Tegeler, Canary Brim, MD  ondansetron (ZOFRAN-ODT) 4 MG disintegrating tablet Take 1 tablet (4 mg total) by mouth every 8 (eight) hours as needed for nausea or vomiting. 04/14/18   Tegeler, Canary Brim, MD  pantoprazole (PROTONIX) 40 MG tablet Take 1 tablet (40 mg total) by mouth daily for 30 days. 04/14/18 05/14/18  Tegeler, Canary Brim, MD  predniSONE (DELTASONE) 20 MG tablet 3 tabs po day one, then 2 po daily x 4 days 04/09/18   Palumbo, April, MD  promethazine (PHENERGAN) 25 MG tablet Take 1 tablet (25 mg total) by mouth every 6 (six) hours as needed for nausea or vomiting. 03/17/18   Clydie Braun, MD  rizatriptan (MAXALT) 10 MG tablet Take 10 mg by mouth as needed for migraine. May repeat in 2 hours if needed    [provider]  spironolactone (ALDACTONE) 25 MG tablet Take 1 tablet (25 mg total) by mouth daily for 30 days. 04/14/18 05/14/18  Tegeler, Canary Brim, MD  thiamine 100 MG tablet Take 1 tablet (100 mg total) by mouth daily for 30 days. 04/14/18 05/14/18  Tegeler, Canary Brim, MD  VENTOLIN HFA 108 (90 Base) MCG/ACT inhaler Inhale 2 puffs into the lungs every 4 (four) hours as needed for wheezing or shortness of breath. 03/16/18   Virgina Norfolk, DO    Family History Family History  Problem Relation Age of Onset  . Arrhythmia Mother   . Heart disease Father   . Hypertension Father     Social History Social History   Tobacco Use  . Smoking status: Current Every Day Smoker    Packs/day: 1.00    Years: 35.00    Pack years: 35.00     Types: Cigarettes  . Smokeless tobacco: Never Used  . Tobacco comment: Pt states smoked this morning  Substance Use Topics  . Alcohol use: Yes    Comment: 1 bottle of wine per day per patient.   . Drug use: No  Allergies   Patient has no known allergies.   Review of Systems Review of Systems  All other systems reviewed and are negative.    Physical Exam Updated Vital Signs BP 113/61 (BP Location: Right Arm)   Pulse 91   Temp 99.1 F (37.3 C) (Oral)   Resp 19   SpO2 99%   Physical Exam Vitals signs and nursing note reviewed.  Constitutional:      General: She is not in acute distress.    Appearance: Normal appearance. She is well-developed. She is obese. She is not ill-appearing, toxic-appearing or diaphoretic.  HENT:     Head: Normocephalic and atraumatic.     Right Ear: External ear normal.     Left Ear: External ear normal.     Mouth/Throat:     Mouth: Mucous membranes are moist.     Pharynx: No oropharyngeal exudate or posterior oropharyngeal erythema.  Eyes:     Conjunctiva/sclera: Conjunctivae normal.     Pupils: Pupils are equal, round, and reactive to light.  Neck:     Musculoskeletal: Normal range of motion and neck supple.     Trachea: Phonation normal.  Cardiovascular:     Rate and Rhythm: Normal rate.  Pulmonary:     Effort: Pulmonary effort is normal.  Skin:    General: Skin is warm and dry.  Neurological:     Mental Status: She is alert and oriented to person, place, and time.     Cranial Nerves: No cranial nerve deficit.     Motor: No abnormal muscle tone.     Coordination: Coordination normal.     Comments: Mild dysarthria.  No aphasia or nystagmus.  Psychiatric:        Mood and Affect: Mood normal.        Behavior: Behavior normal.      ED Treatments / Results  Labs (all labs ordered are listed, but only abnormal results are displayed) Labs Reviewed - No data to display  EKG None  Radiology No results  found.  Procedures Procedures (including critical care time)  Medications Ordered in ED Medications  acetaminophen (TYLENOL) tablet 650 mg (has no administration in time range)     Initial Impression / Assessment and Plan / ED Course  I have reviewed the triage vital signs and the nursing notes.  Pertinent labs & imaging results that were available during my care of the patient were reviewed by me and considered in my medical decision making (see chart for details).         Patient Vitals for the past 24 hrs:  BP Temp Temp src Pulse Resp SpO2  04/16/18 1653 113/61 99.1 F (37.3 C) Oral 91 19 99 %    5:46 PM Reevaluation with update and discussion. After initial assessment and treatment, an updated evaluation reveals no change in clinical status.  Findings discussed with the patient and all questions were answered. Mancel Bale   Medical Decision Making: Patient with history of alcohol abuse, presents likely intoxicated, with ongoing complaints.  No evidence for acute trauma requiring further evaluation or treatment.  Patient reports that she is currently taking an antibiotic for UTI.  Vital signs are reassuring.  There is no indication for further evaluation treatment in the ED setting.  CRITICAL CARE-no Performed by: Mancel Bale  Nursing Notes Reviewed/ Care Coordinated Applicable Imaging Reviewed Interpretation of Laboratory Data incorporated into ED treatment  The patient appears reasonably screened and/or stabilized for discharge and I doubt any other medical  condition or other [facility] requiring further screening, evaluation, or treatment in the ED at this time prior to discharge.  Plan: Home Medications-OTC analgesia of choice, continue routine medications; Home Treatments-avoid alcohol, healthy diet; return here if the recommended treatment, does not improve the symptoms; Recommended follow up-PCP, PRN    Final Clinical Impressions(s) / ED Diagnoses   Final  diagnoses:  Low back pain without sciatica, unspecified back pain laterality, unspecified chronicity  Left knee pain, unspecified chronicity    ED Discharge Orders    None       Mancel BaleWentz, Charise Leinbach, MD 04/16/18 [phone]

## 2018-04-17 ENCOUNTER — Emergency Department (HOSPITAL_COMMUNITY): Payer: No Typology Code available for payment source

## 2018-04-17 ENCOUNTER — Telehealth: Payer: Self-pay | Admitting: *Deleted

## 2018-04-17 MED ORDER — CHLORDIAZEPOXIDE HCL 25 MG PO CAPS: ORAL_CAPSULE | ORAL | 0 refills | Status: DC

## 2018-04-17 MED ORDER — MAGNESIUM CHLORIDE 64 MG PO TBEC: 1.0000 | DELAYED_RELEASE_TABLET | Freq: Two times a day (BID) | ORAL | 0 refills | Status: AC

## 2018-04-17 MED ORDER — POTASSIUM CHLORIDE CRYS ER 20 MEQ PO TBCR: 20.0000 meq | EXTENDED_RELEASE_TABLET | Freq: Two times a day (BID) | ORAL | 0 refills | Status: DC

## 2018-04-17 NOTE — ED Notes (Signed)
Patient verbalizes understanding of discharge instructions. Opportunity for questioning and answers were provided. Armband removed by staff, pt discharged from ED with PTAR.  

## 2018-04-17 NOTE — Discharge Instructions (Addendum)
Thank you for allowing me to care for you today in the Emergency Department.   Take your home Lasix as prescribed.  Do not apply more lidocaine patches than you are prescribed at one time.  Call to schedule follow-up appointment with your primary care provider.  Take Librium, magnesium chloride, potassium chloride as prescribed. You should stop drinking alcohol.   Return to the emergency department for severe shortness of breath, respiratory distress, high fever, persistent vomiting, or other new, concerning symptoms.

## 2018-04-17 NOTE — Telephone Encounter (Signed)
EDCM returned call to pt regarding Rx.  Pt though she was prescribed Toradol and Zofran at last discharge.  EDCM advised that pt was GIVEN these meds but not prescribed.

## 2018-04-18 ENCOUNTER — Emergency Department (HOSPITAL_COMMUNITY): Payer: 59

## 2018-04-18 ENCOUNTER — Emergency Department (HOSPITAL_COMMUNITY)
Admission: EM | Admit: 2018-04-18 | Discharge: 2018-04-18 | DRG: 189 | Discharge status: Against Medical Advice | Payer: 59 | Attending: Internal Medicine | Admitting: Internal Medicine

## 2018-04-18 DIAGNOSIS — E785 Hyperlipidemia, unspecified: Secondary | ICD-10-CM | POA: Diagnosis present

## 2018-04-18 DIAGNOSIS — K219 Gastro-esophageal reflux disease without esophagitis: Secondary | ICD-10-CM | POA: Diagnosis present

## 2018-04-18 DIAGNOSIS — J9621 Acute and chronic respiratory failure with hypoxia: Secondary | ICD-10-CM | POA: Diagnosis not present

## 2018-04-18 DIAGNOSIS — M810 Age-related osteoporosis without current pathological fracture: Secondary | ICD-10-CM | POA: Diagnosis present

## 2018-04-18 DIAGNOSIS — F17210 Nicotine dependence, cigarettes, uncomplicated: Secondary | ICD-10-CM | POA: Diagnosis present

## 2018-04-18 DIAGNOSIS — I4891 Unspecified atrial fibrillation: Secondary | ICD-10-CM | POA: Diagnosis present

## 2018-04-18 DIAGNOSIS — R0602 Shortness of breath: Secondary | ICD-10-CM | POA: Diagnosis present

## 2018-04-18 DIAGNOSIS — G35 Multiple sclerosis: Secondary | ICD-10-CM | POA: Diagnosis present

## 2018-04-18 DIAGNOSIS — J9601 Acute respiratory failure with hypoxia: Secondary | ICD-10-CM

## 2018-04-18 DIAGNOSIS — K7460 Unspecified cirrhosis of liver: Secondary | ICD-10-CM | POA: Diagnosis not present

## 2018-04-18 DIAGNOSIS — Z820 Family history of epilepsy and other diseases of the nervous system: Secondary | ICD-10-CM | POA: Diagnosis not present

## 2018-04-18 DIAGNOSIS — Z5329 Procedure and treatment not carried out because of patient's decision for other reasons: Secondary | ICD-10-CM | POA: Diagnosis present

## 2018-04-18 DIAGNOSIS — F909 Attention-deficit hyperactivity disorder, unspecified type: Secondary | ICD-10-CM | POA: Diagnosis not present

## 2018-04-18 DIAGNOSIS — G47419 Narcolepsy without cataplexy: Secondary | ICD-10-CM | POA: Diagnosis not present

## 2018-04-18 DIAGNOSIS — M1710 Unilateral primary osteoarthritis, unspecified knee: Secondary | ICD-10-CM | POA: Diagnosis not present

## 2018-04-18 DIAGNOSIS — J9622 Acute and chronic respiratory failure with hypercapnia: Secondary | ICD-10-CM | POA: Diagnosis present

## 2018-04-18 DIAGNOSIS — Z8249 Family history of ischemic heart disease and other diseases of the circulatory system: Secondary | ICD-10-CM

## 2018-04-18 DIAGNOSIS — R4182 Altered mental status, unspecified: Secondary | ICD-10-CM

## 2018-04-18 DIAGNOSIS — J9602 Acute respiratory failure with hypercapnia: Secondary | ICD-10-CM

## 2018-04-18 DIAGNOSIS — F319 Bipolar disorder, unspecified: Secondary | ICD-10-CM | POA: Diagnosis present

## 2018-04-18 DIAGNOSIS — E876 Hypokalemia: Secondary | ICD-10-CM | POA: Diagnosis present

## 2018-04-18 DIAGNOSIS — J449 Chronic obstructive pulmonary disease, unspecified: Secondary | ICD-10-CM | POA: Diagnosis not present

## 2018-04-18 DIAGNOSIS — J9691 Respiratory failure, unspecified with hypoxia: Secondary | ICD-10-CM | POA: Diagnosis present

## 2018-04-18 HISTORY — DX: Unilateral primary osteoarthritis, unspecified knee: M17.10

## 2018-04-18 HISTORY — DX: Unspecified atrial fibrillation: I48.91

## 2018-04-18 HISTORY — DX: Allergy, unspecified, initial encounter: T78.40XA

## 2018-04-18 HISTORY — DX: Osteoarthritis of knee, unspecified: M17.9

## 2018-04-18 HISTORY — DX: Gastro-esophageal reflux disease without esophagitis: K21.9

## 2018-04-18 HISTORY — DX: Alcoholic cirrhosis of liver without ascites: K70.30

## 2018-04-18 LAB — COMPREHENSIVE METABOLIC PANEL
ALBUMIN: 4 g/dL (ref 3.5–5.0)
ALT: 16 U/L (ref 0–44)
AST: 32 U/L (ref 15–41)
Alkaline Phosphatase: 96 U/L (ref 38–126)
Anion gap: 10 (ref 5–15)
BUN: <5 mg/dL — ABNORMAL LOW (ref 6–20)
CO2: 37 mmol/L — ABNORMAL HIGH (ref 22–32)
Calcium: 8 mg/dL — ABNORMAL LOW (ref 8.9–10.3)
Chloride: 91 mmol/L — ABNORMAL LOW (ref 98–111)
Creatinine, Ser: 0.45 mg/dL (ref 0.44–1.00)
GFR calc Af Amer: >60 mL/min (ref >60)
GFR calc non Af Amer: >60 mL/min (ref >60)
GLUCOSE: 112 mg/dL — AB (ref 70–99)
Potassium: 2.4 mmol/L — CL (ref 3.5–5.1)
Sodium: 138 mmol/L (ref 135–145)
Total Bilirubin: 0.9 mg/dL (ref 0.3–1.2)
Total Protein: 7 g/dL (ref 6.5–8.1)

## 2018-04-18 LAB — URINALYSIS, ROUTINE W REFLEX MICROSCOPIC
Bilirubin Urine: NEGATIVE
Glucose, UA: NEGATIVE mg/dL
Hgb urine dipstick: NEGATIVE
KETONES UR: NEGATIVE mg/dL
Leukocytes,Ua: NEGATIVE
Nitrite: NEGATIVE
PH: 6 (ref 5.0–8.0)
Protein, ur: NEGATIVE mg/dL
Specific Gravity, Urine: 1.003 — ABNORMAL LOW (ref 1.005–1.030)

## 2018-04-18 LAB — CBC WITH DIFFERENTIAL/PLATELET
Abs Immature Granulocytes: 0.01 10*3/uL (ref 0.00–0.07)
BASOS PCT: 1 %
Basophils Absolute: 0 10*3/uL (ref 0.0–0.1)
Eosinophils Absolute: 0 10*3/uL (ref 0.0–0.5)
Eosinophils Relative: 0 %
HCT: 32.3 % — ABNORMAL LOW (ref 36.0–46.0)
Hemoglobin: 9.3 g/dL — ABNORMAL LOW (ref 12.0–15.0)
Immature Granulocytes: 0 %
Lymphocytes Relative: 23 %
Lymphs Abs: 0.8 10*3/uL (ref 0.7–4.0)
MCH: 23.5 pg — ABNORMAL LOW (ref 26.0–34.0)
MCHC: 28.8 g/dL — ABNORMAL LOW (ref 30.0–36.0)
MCV: 81.6 fL (ref 80.0–100.0)
Monocytes Absolute: 0.7 10*3/uL (ref 0.1–1.0)
Monocytes Relative: 19 %
Neutro Abs: 2.1 10*3/uL (ref 1.7–7.7)
Neutrophils Relative %: 57 %
PLATELETS: 121 10*3/uL — AB (ref 150–400)
RBC: 3.96 MIL/uL (ref 3.87–5.11)
RDW: 19.3 % — ABNORMAL HIGH (ref 11.5–15.5)
WBC: 3.6 10*3/uL — ABNORMAL LOW (ref 4.0–10.5)
nRBC: 0 % (ref 0.0–0.2)

## 2018-04-18 LAB — BLOOD GAS, VENOUS
Acid-Base Excess: 13.1 mmol/L — ABNORMAL HIGH (ref 0.0–2.0)
BICARBONATE: 40.2 mmol/L — AB (ref 20.0–28.0)
O2 Saturation: 47.7 %
Patient temperature: 98.6
pCO2, Ven: 73 mmHg (ref 44.0–60.0)
pH, Ven: 7.361 (ref 7.250–7.430)
pO2, Ven: 34.2 mmHg (ref 32.0–45.0)

## 2018-04-18 LAB — AMMONIA: Ammonia: 35 umol/L (ref 9–35)

## 2018-04-18 LAB — ETHANOL: Alcohol, Ethyl (B): 197 mg/dL — ABNORMAL HIGH (ref <10)

## 2018-04-18 LAB — LACTIC ACID, PLASMA: Lactic Acid, Venous: 2.2 mmol/L (ref 0.5–1.9)

## 2018-04-18 LAB — MAGNESIUM: Magnesium: 1.7 mg/dL (ref 1.7–2.4)

## 2018-04-18 LAB — BRAIN NATRIURETIC PEPTIDE: B NATRIURETIC PEPTIDE 5: 313.9 pg/mL — AB (ref 0.0–100.0)

## 2018-04-18 LAB — TROPONIN I: Troponin I: <0.03 ng/mL (ref <0.03)

## 2018-04-18 MED ORDER — POTASSIUM CHLORIDE 10 MEQ/100ML IV SOLN: 10.0000 meq | INTRAVENOUS | Status: DC

## 2018-04-18 MED ORDER — THIAMINE HCL 100 MG/ML IJ SOLN: 100.0000 mg | Freq: Every day | INTRAMUSCULAR | Status: DC

## 2018-04-18 MED ORDER — FUROSEMIDE 10 MG/ML IJ SOLN
40.0000 mg | INTRAMUSCULAR | Status: AC
  Administered 2018-04-18: 40 mg via INTRAVENOUS
  Filled 2018-04-18: qty 4

## 2018-04-18 MED ORDER — LORAZEPAM 2 MG/ML IJ SOLN: 0.0000 mg | Freq: Four times a day (QID) | INTRAMUSCULAR | Status: DC

## 2018-04-18 MED ORDER — LISINOPRIL 10 MG PO TABS: 10.0000 mg | ORAL_TABLET | Freq: Every day | ORAL | Status: DC

## 2018-04-18 MED ORDER — ONDANSETRON HCL 4 MG/2ML IJ SOLN: 4.0000 mg | Freq: Four times a day (QID) | INTRAMUSCULAR | Status: DC | PRN

## 2018-04-18 MED ORDER — FOLIC ACID 1 MG PO TABS: 1.0000 mg | ORAL_TABLET | Freq: Every day | ORAL | Status: DC

## 2018-04-18 MED ORDER — MAGNESIUM SULFATE 2 GM/50ML IV SOLN
2.0000 g | Freq: Once | INTRAVENOUS | Status: AC
  Administered 2018-04-18: 2 g via INTRAVENOUS
  Filled 2018-04-18: qty 50

## 2018-04-18 MED ORDER — LORAZEPAM 1 MG PO TABS: 0.0000 mg | ORAL_TABLET | Freq: Four times a day (QID) | ORAL | Status: DC

## 2018-04-18 MED ORDER — VITAMIN B-1 100 MG PO TABS: 100.0000 mg | ORAL_TABLET | Freq: Every day | ORAL | Status: DC

## 2018-04-18 MED ORDER — SODIUM CHLORIDE 0.9% FLUSH: 3.0000 mL | INTRAVENOUS | Status: DC | PRN

## 2018-04-18 MED ORDER — FUROSEMIDE 10 MG/ML IJ SOLN: 20.0000 mg | Freq: Two times a day (BID) | INTRAMUSCULAR | Status: DC

## 2018-04-18 MED ORDER — ENOXAPARIN SODIUM 40 MG/0.4ML ~~LOC~~ SOLN: 40.0000 mg | SUBCUTANEOUS | Status: DC

## 2018-04-18 MED ORDER — ADULT MULTIVITAMIN W/MINERALS CH: 1.0000 | ORAL_TABLET | Freq: Every day | ORAL | Status: DC

## 2018-04-18 MED ORDER — ACETAMINOPHEN 325 MG PO TABS: 650.0000 mg | ORAL_TABLET | ORAL | Status: DC | PRN

## 2018-04-18 MED ORDER — SODIUM CHLORIDE 0.9% FLUSH: 3.0000 mL | Freq: Two times a day (BID) | INTRAVENOUS | Status: DC

## 2018-04-18 MED ORDER — LORAZEPAM 1 MG PO TABS: 0.0000 mg | ORAL_TABLET | Freq: Two times a day (BID) | ORAL | Status: DC

## 2018-04-18 MED ORDER — LORAZEPAM 2 MG/ML IJ SOLN: 1.0000 mg | Freq: Four times a day (QID) | INTRAMUSCULAR | Status: DC | PRN

## 2018-04-18 MED ORDER — LORAZEPAM 2 MG/ML IJ SOLN: 0.0000 mg | Freq: Two times a day (BID) | INTRAMUSCULAR | Status: DC

## 2018-04-18 MED ORDER — SODIUM CHLORIDE 0.9 % IV SOLN: 250.0000 mL | INTRAVENOUS | Status: DC | PRN

## 2018-04-18 MED ORDER — LORAZEPAM 1 MG PO TABS: 1.0000 mg | ORAL_TABLET | Freq: Four times a day (QID) | ORAL | Status: DC | PRN

## 2018-04-18 NOTE — ED Notes (Signed)
Upon entering the room patient sitting on edge of bed, oxygen cannula on the floor (O2 Sat 82% on RA). Patient removed IV and remaining Magnesium draining onto the floor. Patient requesting to leave at this time, MD aware. Patient also asking for ginger ale. Will attempt to page admission doctor.

## 2018-04-18 NOTE — ED Triage Notes (Signed)
Transported by [facility] from home-- hx of COPD, initial oxygen saturation of 80-85%. Placed on NRB and oxygen > 95%. Edema and erythema noted to bilateral lower extremities.

## 2018-04-18 NOTE — ED Notes (Signed)
ED Provider at bedside. Pt may have regular diet

## 2018-04-18 NOTE — ED Provider Notes (Signed)
[facility]-EMERGENCY DEPT Provider Note   CSN: [id number] Arrival date & time: 04/18/18  1117    History   Chief Complaint Chief Complaint  Patient presents with  . Shortness of Breath    HPI GISEL VIPOND is a 58 y.o. female.     58 year old female with extensive past medical history including alcohol abuse, cirrhosis, COPD, atrial fibrillation, bipolar disorder, narcolepsy, MS who presents with shortness of breath and altered mental status. Pt was seen here yesterday and discharged home after potassium and Mg supplementation. The patient was picked up by EMS this morning from her home where her initial oxygen saturation was 80 to 85%.  She was placed on supplemental oxygen with improvement.  She called them for generalized weakness.  She states she had a fall a few days ago.  She states that she is always short of breath. She states she's taking her medications.  LEVEL 5 CAVEAT DUE TO AMS  The history is provided by the patient. The history is limited by the condition of the patient.  Shortness of Breath    Past Medical History:  Diagnosis Date  . ADHD (attention deficit hyperactivity disorder)   . Alcoholic cirrhosis (HCC)   . Alcoholism (HCC)   . Allergies   . Anxiety   . Arthritis   . Ascites   . Asthma   . Atrial fibrillation with RVR (HCC)   . Bipolar disorder (HCC)   . COPD (chronic obstructive pulmonary disease) (HCC)   . Depression   . Dyspnea   . Dysrhythmia   . GERD (gastroesophageal reflux disease)   . History of hiatal hernia   . Hypokalemia   . Migraine   . Multiple sclerosis (HCC)   . Narcolepsy   . Osteoarthritis of knee   . Osteoporosis   . Pneumonia     Patient Active Problem List   Diagnosis Date Noted  . Chronic hypoxemic respiratory failure (HCC) 04/11/2018  . Atrial fibrillation with rapid ventricular response (HCC) 03/23/2018  . Head injury   . Fall 03/19/2018  . Syncope 03/19/2018  . Orbital fracture  03/19/2018  . Scalp laceration 03/19/2018  . Acute respiratory failure with hypoxia and hypercarbia (HCC) 03/19/2018  . Abdominal tenderness 03/19/2018  . Acute on chronic respiratory failure with hypoxia (HCC) 03/17/2018  . Alcohol abuse with intoxication (HCC) 03/17/2018  . ETOH abuse   . Normocytic anemia 03/16/2018  . Acute metabolic encephalopathy 03/16/2018  . Lower leg edema 03/16/2018  . Migraine 03/16/2018  . A-fib (HCC) 02/18/2018  . Community acquired pneumonia of left lower lobe of lung (HCC)   . Weakness 08/21/2017  . COPD exacerbation (HCC) 06/12/2017  . Chest pain with moderate risk for cardiac etiology 05/30/2017  . Abnormal LFTs 05/30/2017  . Tachycardia 05/30/2017  . COPD (chronic obstructive pulmonary disease) (HCC) 04/19/2017  . Right rib fracture 04/15/2017  . Abdominal wall cellulitis 12/30/2016  . S/P exploratory laparotomy 12/30/2016  . Distal radius fracture, left 09/09/2016  . Thrombocytopenia (HCC) 09/09/2016  . Chest x-ray abnormality   . Umbilical hernia without obstruction and without gangrene 05/12/2016  . Itching 02/24/2016  . Ventral hernia without obstruction or gangrene 02/24/2016  . Palliative care encounter   . Ascites   . Tachypnea   . Atrial flutter with rapid ventricular response (HCC)   . Hypomagnesemia   . Hypoxia   . Dehydration 01/22/2016  . Low back ache 12/30/2015  . Non-intractable vomiting with nausea   . Alcohol abuse  with alcohol-induced mood disorder (HCC) 12/15/2015  . Acute alcoholism (HCC)   . Abdominal distension 11/04/2015  . Alcohol abuse   . Dyspnea   . Acute respiratory failure (HCC) 10/15/2015  . Ascites due to alcoholic cirrhosis (HCC) 10/15/2015  . Osteoporosis   . Multiple sclerosis (HCC)   . Alcoholic cirrhosis of liver with ascites (HCC) 08/22/2015  . Bipolar disorder, current episode mixed, moderate (HCC) 08/01/2015  . Alcohol use disorder, severe, dependence (HCC) 07/31/2015  . Macrocytic anemia- due  to alcohol abuse with normal B12 & folate levels 05/31/2015  . Severe protein-calorie malnutrition (HCC) 05/31/2015  . Hypokalemia 05/26/2015  . Encephalopathy, hepatic (HCC) 05/26/2015  . Alcohol withdrawal (HCC) 05/18/2015  . Abdominal pain 05/18/2015  . Anxiety 05/18/2015  . Severe sepsis (HCC) 05/18/2015  . UTI (lower urinary tract infection) 05/18/2015  . Stimulant abuse (HCC) 12/27/2014  . Nicotine abuse 12/27/2014  . Hyperprolactinemia (HCC) 11/15/2014  . Dyslipidemia   . Alcohol use disorder, moderate, dependence (HCC) 10/30/2014  . Tobacco abuse 01/23/2014  . Bipolar 1 disorder (HCC) 04/07/2013  . Noncompliance with therapeutic plan 04/04/2013  . Hereditary and idiopathic peripheral neuropathy 05/01/2012  . Depression 05/01/2012  . GERD (gastroesophageal reflux disease) 05/01/2012  . Osteoarthrosis, unspecified whether generalized or localized, involving lower leg 05/01/2012  . Hyponatremia 07/01/2011    Past Surgical History:  Procedure Laterality Date  . ABDOMINAL WALL DEFECT REPAIR N/A 12/30/2016   Procedure: EXPLORATORY LAPAROTOMY WITH REPAIR ABDOMINAL WALL VENTRAL HERNIA;  Surgeon: [name], MD;  Location: [facility] OR;  Service: General;  Laterality: N/A;  . APPLICATION OF WOUND VAC N/A 12/30/2016   Procedure: APPLICATION OF WOUND VAC;  Surgeon: [name], MD;  Location: [facility] OR;  Service: General;  Laterality: N/A;  . CESAREAN SECTION  [phone]  . FRACTURE SURGERY    . HERNIA REPAIR    . MYRINGOTOMY WITH TUBE PLACEMENT Bilateral   . ORIF WRIST FRACTURE Left 09/09/2016   Procedure: OPEN REDUCTION INTERNAL FIXATION (ORIF) LEFT WRIST FRACTURE, LEFT CARPAL TUNNEL RELEASE;  Surgeon: Dominica Severin, MD;  Location: [facility] ORS;  Service: Orthopedics;  Laterality: Left;  . TONSILLECTOMY       OB History   No obstetric history on file.      Home Medications    Prior to Admission medications   Medication Sig Start Date End Date Taking? Authorizing Provider    acetaminophen (TYLENOL) 500 MG tablet Take 1 tablet (500 mg total) by mouth every 6 (six) hours as needed for mild pain. Patient taking differently: Take 1,000 mg by mouth every 6 (six) hours as needed for mild pain.  03/28/18   Rolly Salter, MD  aluminum-magnesium hydroxide-simethicone (MAALOX) 200-200-20 MG/5ML SUSP Take 30 mLs by mouth 4 (four) times daily as needed (indigestion).     [provider]  chlordiazePOXIDE (LIBRIUM) 25 MG capsule 50mg  PO TID x 1D, then 25-50mg  PO BID X 1D, then 25-50mg  PO QD X 1D 04/17/18   McDonald, Mia A, PA-C  diltiazem (CARDIZEM CD) 120 MG 24 hr capsule Take 1 capsule (120 mg total) by mouth daily for 30 days. 04/14/18 05/14/18  Tegeler, Canary Brim, MD  FLUoxetine (PROZAC) 20 MG capsule Take 1 capsule (20 mg total) by mouth daily for 30 doses. 04/14/18 05/14/18  Tegeler, Canary Brim, MD  Fluticasone-Salmeterol [facility] INHUB) 250-50 MCG/DOSE AEPB Inhale 1 puff into the lungs 2 (two) times daily.    [provider]  folic acid (FOLVITE) 1 MG tablet Take 1 tablet (1 mg  total) by mouth daily for 30 days. 04/12/18 05/12/18  Dimple Nanas, MD  furosemide (LASIX) 40 MG tablet Take 40 mg twice daily till 04/02/2018, after that take 40 mg daily. 04/14/18   Tegeler, Canary Brim, MD  gabapentin (NEURONTIN) 100 MG capsule Take 2 capsules (200 mg total) by mouth 3 (three) times daily for 30 days. 04/14/18 05/14/18  Tegeler, Canary Brim, MD  hydrOXYzine (ATARAX/VISTARIL) 25 MG tablet Take 1 tablet (25 mg total) by mouth daily as needed for up to 30 doses (anxiety). 04/14/18   Tegeler, Canary Brim, MD  lactulose (CHRONULAC) 10 GM/15ML solution Take 45 mLs (30 g total) by mouth 3 (three) times daily for 30 days. 04/14/18 05/14/18  Tegeler, Canary Brim, MD  lidocaine (LIDODERM) 5 % Place 1 patch onto the skin daily. Remove & Discard patch within 12 hours or as directed by MD 04/09/18   Nicanor Alcon, April, MD  magnesium chloride (SLOW-MAG) 64 MG TBEC SR tablet Take 1 tablet (64  mg total) by mouth 2 (two) times daily for 5 days. 04/17/18 04/22/18  McDonald, Mia A, PA-C  nitrofurantoin, macrocrystal-monohydrate, (MACROBID) 100 MG capsule Take 1 capsule (100 mg total) by mouth 2 (two) times daily for 5 days. 04/14/18 04/19/18  Tegeler, Canary Brim, MD  ondansetron (ZOFRAN-ODT) 4 MG disintegrating tablet Take 1 tablet (4 mg total) by mouth every 8 (eight) hours as needed for nausea or vomiting. 04/14/18   Tegeler, Canary Brim, MD  pantoprazole (PROTONIX) 40 MG tablet Take 1 tablet (40 mg total) by mouth daily for 30 days. 04/14/18 05/14/18  Tegeler, Canary Brim, MD  potassium chloride SA (K-DUR,KLOR-CON) 20 MEQ tablet Take 1 tablet (20 mEq total) by mouth 2 (two) times daily for 5 days. 04/17/18 04/22/18  McDonald, Mia A, PA-C  predniSONE (DELTASONE) 20 MG tablet 3 tabs po day one, then 2 po daily x 4 days 04/09/18   Palumbo, April, MD  rizatriptan (MAXALT) 10 MG tablet Take 10 mg by mouth as needed for migraine. May repeat in 2 hours if needed    [provider]  spironolactone (ALDACTONE) 25 MG tablet Take 1 tablet (25 mg total) by mouth daily for 30 days. 04/14/18 05/14/18  Tegeler, Canary Brim, MD  thiamine 100 MG tablet Take 1 tablet (100 mg total) by mouth daily for 30 days. 04/14/18 05/14/18  Tegeler, Canary Brim, MD    Family History Family History  Problem Relation Age of Onset  . Arrhythmia Mother   . Neuropathy Mother   . Coronary artery disease Mother   . Heart disease Mother   . Heart disease Father   . Hypertension Father   . Heart attack Father   . Multiple sclerosis Maternal Aunt     Social History Social History   Tobacco Use  . Smoking status: Current Every Day Smoker    Packs/day: 1.00    Years: 35.00    Pack years: 35.00    Types: Cigarettes  . Smokeless tobacco: Never Used  . Tobacco comment: Pt states smoked this morning  Substance Use Topics  . Alcohol use: Yes    Comment: 1 bottle of wine per day per patient.   . Drug use: No      Allergies   Patient has no known allergies.   Review of Systems Review of Systems  Unable to perform ROS: Mental status change  Respiratory: Positive for shortness of breath.      Physical Exam Updated Vital Signs BP 108/65 (BP Location: Right Arm)  Pulse 85   Temp 98.4 F (36.9 C) (Oral)   Resp (!) 22   SpO2 99%   Physical Exam Vitals signs and nursing note reviewed.  Constitutional:      General: She is not in acute distress.    Appearance: She is obese.     Comments: Somnolent but arousable by voice, chronically ill-appearing  HENT:     Head: Normocephalic and atraumatic.     Mouth/Throat:     Comments: Dry mouth Eyes:     Conjunctiva/sclera: Conjunctivae normal.     Pupils: Pupils are equal, round, and reactive to light.  Neck:     Musculoskeletal: Neck supple.  Cardiovascular:     Rate and Rhythm: Normal rate and regular rhythm.     Heart sounds: Normal heart sounds. No murmur.  Pulmonary:     Comments: Mildly increased work of breathing without respiratory distress, diminished breath sounds bilaterally Abdominal:     General: Bowel sounds are normal. There is no distension.     Palpations: Abdomen is soft.     Tenderness: There is no abdominal tenderness.  Musculoskeletal:     Right lower leg: She exhibits tenderness. Edema present.     Left lower leg: She exhibits tenderness. Edema present.     Comments: + tender pitting edema BLE to knees, erythema b/l feet to mid-shins  Skin:    General: Skin is warm and dry.     Findings: Erythema present.     Comments: scattered bruises and abrasions on bilateral forearms  Neurological:     Mental Status: She is disoriented.     Comments: Fluent speech  Psychiatric:        Judgment: Judgment normal.      ED Treatments / Results  Labs (all labs ordered are listed, but only abnormal results are displayed) Labs Reviewed  COMPREHENSIVE METABOLIC PANEL - Abnormal; Notable for the following components:       Result Value   Potassium 2.4 (*)    Chloride 91 (*)    CO2 37 (*)    Glucose, Bld 112 (*)    BUN <5 (*)    Calcium 8.0 (*)    All other components within normal limits  ETHANOL - Abnormal; Notable for the following components:   Alcohol, Ethyl (B) 197 (*)    All other components within normal limits  LACTIC ACID, PLASMA - Abnormal; Notable for the following components:   Lactic Acid, Venous 2.2 (*)    All other components within normal limits  BRAIN NATRIURETIC PEPTIDE - Abnormal; Notable for the following components:   B Natriuretic Peptide 313.9 (*)    All other components within normal limits  CBC WITH DIFFERENTIAL/PLATELET - Abnormal; Notable for the following components:   WBC 3.6 (*)    Hemoglobin 9.3 (*)    HCT 32.3 (*)    MCH 23.5 (*)    MCHC 28.8 (*)    RDW 19.3 (*)    Platelets 121 (*)    All other components within normal limits  URINALYSIS, ROUTINE W REFLEX MICROSCOPIC - Abnormal; Notable for the following components:   Color, Urine STRAW (*)    Specific Gravity, Urine 1.003 (*)    All other components within normal limits  BLOOD GAS, VENOUS - Abnormal; Notable for the following components:   pCO2, Ven 73.0 (*)    Bicarbonate 40.2 (*)    Acid-Base Excess 13.1 (*)    All other components within normal limits  TROPONIN I  AMMONIA  MAGNESIUM  LACTIC ACID, PLASMA  BASIC METABOLIC PANEL    EKG EKG Interpretation  Date/Time:  Tuesday April 18 2018 12:17:30 EDT Ventricular Rate:  79 PR Interval:    QRS Duration: 101 QT Interval:  432 QTC Calculation: 496 R Axis:   75 Text Interpretation:  Sinus rhythm Borderline repolarization abnormality Borderline prolonged QT interval since previous tracing, rate slower Confirmed by Frederick Peers [phone]) on 04/18/2018 12:46:39 PM Also confirmed by Frederick Peers [phone]), editor Barbette Hair [phone])  on 04/18/2018 1:23:01 PM   Radiology Dg Thoracic Spine 2 View  Result Date: 04/17/2018 CLINICAL DATA:  Pain after  fall. EXAM: THORACIC SPINE 2 VIEWS COMPARISON:  04/14/2018 CXR FINDINGS: There is no evidence of thoracic spine fracture. Alignment is normal. Mild multilevel disc space narrowing and minimal endplate spurring is seen. No other significant bone abnormalities are identified. IMPRESSION: Mild degenerative change along the thoracic spine without acute osseous appearing abnormality. Electronically Signed   By: Tollie Eth M.D.   On: 04/17/2018 00:55   Dg Chest Portable 1 View  Result Date: 04/16/2018 CLINICAL DATA:  Chest pain EXAM: PORTABLE CHEST 1 VIEW COMPARISON:  04/14/2018 FINDINGS: Stable cardiomegaly. Minimal aortic atherosclerosis without aneurysm. No acute pulmonary consolidation, effusion or pneumothorax. Minimal left basilar atelectasis. No acute nor suspicious osseous abnormalities. IMPRESSION: Stable cardiomegaly without active pulmonary disease. Electronically Signed   By: Tollie Eth M.D.   On: 04/16/2018 22:30    Procedures Procedures (including critical care time)  Medications Ordered in ED Medications - No data to display   Initial Impression / Assessment and Plan / ED Course  I have reviewed the triage vital signs and the nursing notes.  Pertinent labs & imaging results that were available during my care of the patient were reviewed by me and considered in my medical decision making (see chart for details).       She was somnolent but arousable on exam, requiring 1-2L O2 [place]. BLE edema w/ erythema of lower legs.  Lab work shows a venous pH 7.36 with CO2 73, BNP 313, alcohol 197, lactate 2.2, potassium 2.4, WBC 3.6.  Hest x-ray with cardiomegaly and pulmonary vascular congestion which I suspect may be contributing to her hypoxia.  Ordered IV potassium and IV Lasix.  Head CT negative acute.  Because of patient's ongoing hypoxia and altered mentation, discussed admission with [facility], Dr. Gerri Lins, who will admit for further care.   Final Clinical Impressions(s) / ED Diagnoses   Final  diagnoses:  Acute respiratory failure with hypoxia and hypercapnia (HCC)  Hypokalemia  Altered mental status, unspecified altered mental status type    ED Discharge Orders    None       , Ambrose Finland, MD 04/18/18 [phone]

## 2018-04-18 NOTE — ED Notes (Signed)
ED Provider at bedside. 

## 2018-04-18 NOTE — ED Notes (Signed)
CRITICAL VALUE STICKER  CRITICAL VALUE: Lactic Acid of 2.2  RECEIVER (on-site recipient of call): Kiwanna Spraker, RN  DATE & TIME NOTIFIED: 04/18/2018 @ 13:23  MESSENGER (representative from lab): Tammy, Lab Tech  MD NOTIFIED: Dr. Clarene Duke  TIME OF NOTIFICATION: 13:25  RESPONSE: Dr. Clarene Duke acknowledged lab value.

## 2018-04-18 NOTE — ED Notes (Signed)
Admission physician aware of patient choosing to leave AMA.

## 2018-04-18 NOTE — ED Notes (Signed)
Patient provided with paper scrubs and charge nurse Aundra Millet called to bedside and escorted patient to the lobby.

## 2018-04-18 NOTE — ED Notes (Signed)
CRITICAL VALUE STICKER  CRITICAL VALUE: K of 2.4  RECEIVER (on-site recipient of call): Ytzel Gubler, RN  DATE & TIME NOTIFIED: 04/18/2018 @ 13:27  MESSENGER (representative from lab): Tammy, Lab Tech  MD NOTIFIED: Dr. Clarene Duke  TIME OF NOTIFICATION: 13:29  RESPONSE: Dr. Clarene Duke acknowledged lab value.

## 2018-04-18 NOTE — Progress Notes (Signed)
Went to do nursing admission history. Nurses in room with patient and state that patient is leaving. Briscoe Burns BSN, RN-BC Admissions RN 04/18/2018 3:10 PM

## 2018-04-18 NOTE — ED Notes (Signed)
Patient transported to CT 

## 2018-04-18 NOTE — ED Notes (Signed)
Bed: WA21 Expected date:  Expected time:  Means of arrival:  Comments: EMS generalized weakness, BEDBUGS

## 2018-04-19 ENCOUNTER — Emergency Department (HOSPITAL_COMMUNITY)
Admission: EM | Admit: 2018-04-19 | Discharge: 2018-04-19 | Disposition: A | Discharge status: Home / Self Care | Payer: No Typology Code available for payment source | Attending: Emergency Medicine | Admitting: Emergency Medicine

## 2018-04-19 ENCOUNTER — Other Ambulatory Visit: Payer: Self-pay

## 2018-04-19 DIAGNOSIS — R0602 Shortness of breath: Secondary | ICD-10-CM | POA: Insufficient documentation

## 2018-04-19 DIAGNOSIS — J449 Chronic obstructive pulmonary disease, unspecified: Secondary | ICD-10-CM | POA: Diagnosis not present

## 2018-04-19 DIAGNOSIS — Z79899 Other long term (current) drug therapy: Secondary | ICD-10-CM | POA: Diagnosis not present

## 2018-04-19 DIAGNOSIS — R531 Weakness: Secondary | ICD-10-CM | POA: Insufficient documentation

## 2018-04-19 DIAGNOSIS — J45909 Unspecified asthma, uncomplicated: Secondary | ICD-10-CM | POA: Insufficient documentation

## 2018-04-19 DIAGNOSIS — R600 Localized edema: Secondary | ICD-10-CM | POA: Insufficient documentation

## 2018-04-19 DIAGNOSIS — E876 Hypokalemia: Secondary | ICD-10-CM

## 2018-04-19 DIAGNOSIS — F10229 Alcohol dependence with intoxication, unspecified: Secondary | ICD-10-CM | POA: Diagnosis not present

## 2018-04-19 DIAGNOSIS — F17210 Nicotine dependence, cigarettes, uncomplicated: Secondary | ICD-10-CM | POA: Insufficient documentation

## 2018-04-19 DIAGNOSIS — F10920 Alcohol use, unspecified with intoxication, uncomplicated: Secondary | ICD-10-CM

## 2018-04-19 DIAGNOSIS — R2240 Localized swelling, mass and lump, unspecified lower limb: Secondary | ICD-10-CM | POA: Diagnosis present

## 2018-04-19 LAB — CBC
HCT: 30.9 % — ABNORMAL LOW (ref 36.0–46.0)
Hemoglobin: 9 g/dL — ABNORMAL LOW (ref 12.0–15.0)
MCH: 23.5 pg — ABNORMAL LOW (ref 26.0–34.0)
MCHC: 29.1 g/dL — AB (ref 30.0–36.0)
MCV: 80.7 fL (ref 80.0–100.0)
Platelets: 94 10*3/uL — ABNORMAL LOW (ref 150–400)
RBC: 3.83 MIL/uL — ABNORMAL LOW (ref 3.87–5.11)
RDW: 19.3 % — ABNORMAL HIGH (ref 11.5–15.5)
WBC: 2.9 10*3/uL — ABNORMAL LOW (ref 4.0–10.5)
nRBC: 0 % (ref 0.0–0.2)

## 2018-04-19 LAB — COMPREHENSIVE METABOLIC PANEL
ALT: 14 U/L (ref 0–44)
AST: 28 U/L (ref 15–41)
Albumin: 3.4 g/dL — ABNORMAL LOW (ref 3.5–5.0)
Alkaline Phosphatase: 83 U/L (ref 38–126)
Anion gap: 12 (ref 5–15)
BUN: <5 mg/dL — ABNORMAL LOW (ref 6–20)
CO2: 37 mmol/L — ABNORMAL HIGH (ref 22–32)
Calcium: 7.9 mg/dL — ABNORMAL LOW (ref 8.9–10.3)
Chloride: 89 mmol/L — ABNORMAL LOW (ref 98–111)
Creatinine, Ser: 0.42 mg/dL — ABNORMAL LOW (ref 0.44–1.00)
GFR calc Af Amer: >60 mL/min (ref >60)
Glucose, Bld: 98 mg/dL (ref 70–99)
POTASSIUM: 2.5 mmol/L — AB (ref 3.5–5.1)
Sodium: 138 mmol/L (ref 135–145)
Total Bilirubin: 1.1 mg/dL (ref 0.3–1.2)
Total Protein: 6.3 g/dL — ABNORMAL LOW (ref 6.5–8.1)

## 2018-04-19 LAB — ETHANOL: Alcohol, Ethyl (B): 186 mg/dL — ABNORMAL HIGH (ref <10)

## 2018-04-19 LAB — BRAIN NATRIURETIC PEPTIDE: B Natriuretic Peptide: 231.2 pg/mL — ABNORMAL HIGH (ref 0.0–100.0)

## 2018-04-19 LAB — RAPID URINE DRUG SCREEN, HOSP PERFORMED
AMPHETAMINES: NOT DETECTED
Barbiturates: NOT DETECTED
Benzodiazepines: POSITIVE — AB
Cocaine: NOT DETECTED
Opiates: NOT DETECTED
TETRAHYDROCANNABINOL: NOT DETECTED

## 2018-04-19 LAB — ACETAMINOPHEN LEVEL

## 2018-04-19 LAB — MAGNESIUM: Magnesium: 2.1 mg/dL (ref 1.7–2.4)

## 2018-04-19 LAB — TROPONIN I: Troponin I: <0.03 ng/mL (ref <0.03)

## 2018-04-19 LAB — SALICYLATE LEVEL: Salicylate Lvl: <7 mg/dL (ref 2.8–30.0)

## 2018-04-19 MED ORDER — POTASSIUM CHLORIDE CRYS ER 20 MEQ PO TBCR
40.0000 meq | EXTENDED_RELEASE_TABLET | Freq: Once | ORAL | Status: AC
  Administered 2018-04-19: 40 meq via ORAL
  Filled 2018-04-19: qty 2

## 2018-04-19 MED ORDER — POTASSIUM CHLORIDE CRYS ER 20 MEQ PO TBCR: 40.0000 meq | EXTENDED_RELEASE_TABLET | Freq: Two times a day (BID) | ORAL | 0 refills | Status: AC

## 2018-04-19 MED ORDER — SUMATRIPTAN SUCCINATE 6 MG/0.5ML ~~LOC~~ SOLN
6.0000 mg | Freq: Once | SUBCUTANEOUS | Status: DC
  Filled 2018-04-19: qty 0.5

## 2018-04-19 MED ORDER — ONDANSETRON HCL 4 MG/2ML IJ SOLN
4.0000 mg | Freq: Once | INTRAMUSCULAR | Status: AC
  Administered 2018-04-19: 4 mg via INTRAVENOUS
  Filled 2018-04-19: qty 2

## 2018-04-19 MED ORDER — IPRATROPIUM-ALBUTEROL 0.5-2.5 (3) MG/3ML IN SOLN
3.0000 mL | Freq: Once | RESPIRATORY_TRACT | Status: AC
  Administered 2018-04-19: 3 mL via RESPIRATORY_TRACT
  Filled 2018-04-19: qty 3

## 2018-04-19 MED ORDER — LORAZEPAM 2 MG/ML IJ SOLN: 0.0000 mg | Freq: Four times a day (QID) | INTRAMUSCULAR | Status: DC

## 2018-04-19 MED ORDER — ACETAMINOPHEN 325 MG PO TABS
650.0000 mg | ORAL_TABLET | Freq: Once | ORAL | Status: AC
  Administered 2018-04-19: 650 mg via ORAL
  Filled 2018-04-19: qty 2

## 2018-04-19 MED ORDER — VITAMIN B-1 100 MG PO TABS
100.0000 mg | ORAL_TABLET | Freq: Every day | ORAL | Status: DC
  Administered 2018-04-19: 100 mg via ORAL
  Filled 2018-04-19: qty 1

## 2018-04-19 MED ORDER — PROCHLORPERAZINE MALEATE 5 MG PO TABS
5.0000 mg | ORAL_TABLET | Freq: Once | ORAL | Status: AC
  Administered 2018-04-19: 5 mg via ORAL
  Filled 2018-04-19: qty 1

## 2018-04-19 MED ORDER — LORAZEPAM 1 MG PO TABS
0.0000 mg | ORAL_TABLET | Freq: Four times a day (QID) | ORAL | Status: DC
  Administered 2018-04-19: 1 mg via ORAL
  Filled 2018-04-19: qty 1

## 2018-04-19 MED ORDER — LORAZEPAM 1 MG PO TABS: 0.0000 mg | ORAL_TABLET | Freq: Two times a day (BID) | ORAL | Status: DC

## 2018-04-19 MED ORDER — THIAMINE HCL 100 MG/ML IJ SOLN: 100.0000 mg | Freq: Every day | INTRAMUSCULAR | Status: DC

## 2018-04-19 MED ORDER — LORAZEPAM 2 MG/ML IJ SOLN: 0.0000 mg | Freq: Two times a day (BID) | INTRAMUSCULAR | Status: DC

## 2018-04-19 MED ORDER — SODIUM CHLORIDE 0.9 % IV BOLUS: 1000.0000 mL | Freq: Once | INTRAVENOUS | Status: DC

## 2018-04-19 MED ORDER — METHYLPREDNISOLONE SODIUM SUCC 125 MG IJ SOLR
125.0000 mg | Freq: Once | INTRAMUSCULAR | Status: AC
  Administered 2018-04-19: 125 mg via INTRAVENOUS
  Filled 2018-04-19: qty 2

## 2018-04-19 MED ORDER — POTASSIUM CHLORIDE 10 MEQ/100ML IV SOLN
10.0000 meq | Freq: Once | INTRAVENOUS | Status: AC
  Administered 2018-04-19: 10 meq via INTRAVENOUS
  Filled 2018-04-19: qty 100

## 2018-04-19 MED ORDER — NICOTINE 21 MG/24HR TD PT24
21.0000 mg | MEDICATED_PATCH | Freq: Every day | TRANSDERMAL | Status: DC
  Administered 2018-04-19: 21 mg via TRANSDERMAL
  Filled 2018-04-19: qty 1

## 2018-04-19 MED ORDER — LORAZEPAM 1 MG PO TABS
1.0000 mg | ORAL_TABLET | Freq: Once | ORAL | Status: AC
  Administered 2018-04-19: 1 mg via ORAL
  Filled 2018-04-19: qty 1

## 2018-04-19 NOTE — ED Notes (Signed)
Patient ambulated to and from the restroom with minimal assist. Patient is asking to sit in chair.

## 2018-04-19 NOTE — ED Notes (Signed)
[facility] called for transportation home due to need for O2 and ambulation assistance.

## 2018-04-19 NOTE — Progress Notes (Signed)
Of note: Per hospitalist's notes: I feel that other than her hypokalemia, she would probably not qualify for skilled facility or IP hosptial admission I spoke to social worker who informed me that APS is working on getting guardianship of her given her multiple visits to the emergency room I am hopeful that she will be able to stay out of the ED and I have spoken to the PA with the emergency room who I have asked to give a prescription for potassium on discharge.  CSW will continue to follow for D/C needs.  Anne Black. Jacey Eckerson, LCSW, LCAS, CSI Clinical Social Worker Ph: [phone]

## 2018-04-19 NOTE — ED Notes (Addendum)
Patient called me into room again, I asked what she needed? Patient stated " I'm not getting enough attention." This writer explained to patient that we have multiple patients and we are unable to stay in her room. Patient then stated, "I want this out" Talking about her IV. This writer explained to patient that I cannot take patient's IV out until there was an order. Patient stated she would like to order a sitter."Patient has been taking advantage of staff her entire stay.

## 2018-04-19 NOTE — Discharge Planning (Signed)
[facility] consulted regarding multiple ED admissions.  EDCM has worked with pt multiple times and most recently to have Rx delivered at bedside.  Pt has been declined by all home health agencies due to safety for caregivers in the home.  [facility] notes pt under IVC and will continue to follow for disposition needs.

## 2018-04-19 NOTE — ED Notes (Signed)
Patient called out to have assistance to bed, she was short of breath. Patient did not have oxygen on and when hooked up, sat was 81%. Patient stated she is nauseated.

## 2018-04-19 NOTE — ED Notes (Signed)
Patient has been given coffee and multiple ginger ale. Patient states she is nauseated. RN aware

## 2018-04-19 NOTE — Progress Notes (Signed)
Per CN, pt was yelling and screaming in the hallway and demanding a taxi voucher.  CSW confirmed pt could not ambulate without oxygen but was demanding D/C and would be unsafe to transport via bus.  CSW provided pt's RN with a taxi voucher.  CN updated.  Please reconsult if future social work needs arise.  CSW signing off, as social work intervention is no longer needed.  Dorothe Pea. Havyn Ramo, LCSW, LCAS, CSI Clinical Social Worker Ph: [phone]

## 2018-04-19 NOTE — ED Notes (Signed)
Date and time results received: 04/19/18 0842 (use smartphrase ".now" to insert current time)  Test: K+ Critical Value: 2.5  Name of Provider Notified: Stephenie Acres.  Orders Received? Or Actions Taken?:

## 2018-04-19 NOTE — Progress Notes (Signed)
PT Cancellation Note  Patient Details Name: Anne Black MRN: [id number] DOB: [birth year]   Cancelled Treatment:     chart reviewed and pt was sitting in a chair beside her bed when she called me into the room . At this time she was perseverating on the need for me to get the nurse to get her something for nauseous. Once this was done she perseverated on nee for MD to give her medications for withdrawal symptoms. I was able to watch her walk from the chair around the bed to then lie down. No help needed, but she was not focusing on allowing me to continue any more mobility assessment at this time. Very focused on other things.   Noted her [place] O2 was off and she was at 73%, I replaced the 2L O2 and reviewed with her the need for her O2 at this time and breathing exercises. Her Oxygen did recover within a few minutes.   No PT assessment was conducted, however nursing staff stated she has been walking to the bathroom in the hall with supervision. Will try to check on pt tomorrow for full PT assessment if still needed.    Marella Bile 04/19/2018, 5:33 PM Marella Bile, PT Acute Rehabilitation Services Pager: [phone] Office: [phone] 04/19/2018

## 2018-04-19 NOTE — Progress Notes (Signed)
1:09PM: CSW met with patients APS worker with [facility], [place], who stated DSS is attempting to get guardianship of patient at this time however paperwork has not yet been completed and they are unsure if guardianship with be awarded due to fluctuating orientation. CSW spoke with hospitalist who does not think patient meets inpatient criteria. PA,Joy, to place PT consult to attempt SNF placement however CSW thinks SNF placement will be unlikely due to the following barriers: patient is ambulatory, hx of alcoholism, and hx of leaving AMA.   CSW aware of consult- patient has been cleared by psychiatry at this time and is awaiting peer support. EDP is contacting hospitalist to determine if patient meets inpatient criteria.   CSW will continue to follow.  Kingsley Spittle, [place]  [phone]

## 2018-04-19 NOTE — ED Provider Notes (Signed)
[facility]-EMERGENCY DEPT Provider Note   CSN: [id number] Arrival date & time: 04/19/18  7829    History   Chief Complaint Chief Complaint  Patient presents with  . Leg Swelling    HPI Anne Black is a 58 y.o. female.     HPI   Anne Black is a 58 y.o. female, with a history of alcohol abuse, A. fib, COPD, bipolar, presenting to the ED with under IVC via [facility] department.  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  Please see IVC paperwork for exact language.  She is on 2 to 3 L supplemental O2 at home.  Complains of shortness of breath, but will not further characterize this.  Does state she has had increased lower extremity edema bilaterally She does frequently use alcohol, but will not detail how much. Denies SI/HI. See ED course for further updates and information.         Past Medical History:  Diagnosis Date  . ADHD (attention deficit hyperactivity disorder)   . Alcoholic cirrhosis (HCC)   . Alcoholism (HCC)   . Allergies   . Anxiety   . Arthritis   . Ascites   . Asthma   . Atrial fibrillation with RVR (HCC)   . Bipolar disorder (HCC)   . COPD (chronic obstructive pulmonary disease) (HCC)   . Depression   . Dyspnea   . Dysrhythmia   . GERD (gastroesophageal reflux disease)   . History of hiatal hernia   . Hypokalemia   . Migraine   . Multiple sclerosis (HCC)   . Narcolepsy   . Osteoarthritis of knee   . Osteoporosis   . Pneumonia     Patient Active Problem List   Diagnosis Date Noted  . Respiratory failure with hypoxia (HCC) 04/18/2018  . Chronic hypoxemic respiratory failure (HCC) 04/11/2018  . Atrial fibrillation with rapid ventricular response (HCC) 03/23/2018  . Head injury   . Fall 03/19/2018  . Syncope 03/19/2018  . Orbital fracture 03/19/2018  . Scalp laceration 03/19/2018  . Acute respiratory failure  with hypoxia and hypercarbia (HCC) 03/19/2018  . Abdominal tenderness 03/19/2018  . Acute on chronic respiratory failure with hypoxia (HCC) 03/17/2018  . Alcohol abuse with intoxication (HCC) 03/17/2018  . ETOH abuse   . Normocytic anemia 03/16/2018  . Acute metabolic encephalopathy 03/16/2018  . Lower leg edema 03/16/2018  . Migraine 03/16/2018  . A-fib (HCC) 02/18/2018  . Community acquired pneumonia of left lower lobe of lung (HCC)   . Weakness 08/21/2017  . COPD exacerbation (HCC) 06/12/2017  . Chest pain with moderate risk for cardiac etiology 05/30/2017  . Abnormal LFTs 05/30/2017  . Tachycardia 05/30/2017  . COPD (chronic obstructive pulmonary disease) (HCC) 04/19/2017  . Right rib fracture 04/15/2017  . Abdominal wall cellulitis 12/30/2016  . S/P exploratory laparotomy 12/30/2016  . Distal radius fracture, left 09/09/2016  . Thrombocytopenia (HCC) 09/09/2016  . Chest x-ray abnormality   . Umbilical hernia without obstruction and without gangrene 05/12/2016  . Itching 02/24/2016  . Ventral hernia without obstruction or gangrene 02/24/2016  . Palliative care encounter   . Ascites   . Tachypnea   . Atrial flutter with rapid ventricular response (HCC)   . Hypomagnesemia   . Hypoxia   . Dehydration 01/22/2016  . Low back ache 12/30/2015  . Non-intractable vomiting with nausea   . Alcohol abuse with alcohol-induced  mood disorder (HCC) 12/15/2015  . Acute alcoholism (HCC)   . Abdominal distension 11/04/2015  . Alcohol abuse   . Dyspnea   . Acute respiratory failure (HCC) 10/15/2015  . Ascites due to alcoholic cirrhosis (HCC) 10/15/2015  . Osteoporosis   . Multiple sclerosis (HCC)   . Alcoholic cirrhosis of liver with ascites (HCC) 08/22/2015  . Bipolar disorder, current episode mixed, moderate (HCC) 08/01/2015  . Alcohol use disorder, severe, dependence (HCC) 07/31/2015  . Macrocytic anemia- due to alcohol abuse with normal B12 & folate levels 05/31/2015  . Severe  protein-calorie malnutrition (HCC) 05/31/2015  . Hypokalemia 05/26/2015  . Encephalopathy, hepatic (HCC) 05/26/2015  . Alcohol withdrawal (HCC) 05/18/2015  . Abdominal pain 05/18/2015  . Anxiety 05/18/2015  . Severe sepsis (HCC) 05/18/2015  . UTI (lower urinary tract infection) 05/18/2015  . Stimulant abuse (HCC) 12/27/2014  . Nicotine abuse 12/27/2014  . Hyperprolactinemia (HCC) 11/15/2014  . Dyslipidemia   . Alcohol use disorder, moderate, dependence (HCC) 10/30/2014  . Tobacco abuse 01/23/2014  . Bipolar 1 disorder (HCC) 04/07/2013  . Noncompliance with therapeutic plan 04/04/2013  . Hereditary and idiopathic peripheral neuropathy 05/01/2012  . Depression 05/01/2012  . GERD (gastroesophageal reflux disease) 05/01/2012  . Osteoarthrosis, unspecified whether generalized or localized, involving lower leg 05/01/2012  . Hyponatremia 07/01/2011    Past Surgical History:  Procedure Laterality Date  . ABDOMINAL WALL DEFECT REPAIR N/A 12/30/2016   Procedure: EXPLORATORY LAPAROTOMY WITH REPAIR ABDOMINAL WALL VENTRAL HERNIA;  Surgeon: [name], MD;  Location: [facility] OR;  Service: General;  Laterality: N/A;  . APPLICATION OF WOUND VAC N/A 12/30/2016   Procedure: APPLICATION OF WOUND VAC;  Surgeon: [name], MD;  Location: [facility] OR;  Service: General;  Laterality: N/A;  . CESAREAN SECTION  [phone]  . FRACTURE SURGERY    . HERNIA REPAIR    . MYRINGOTOMY WITH TUBE PLACEMENT Bilateral   . ORIF WRIST FRACTURE Left 09/09/2016   Procedure: OPEN REDUCTION INTERNAL FIXATION (ORIF) LEFT WRIST FRACTURE, LEFT CARPAL TUNNEL RELEASE;  Surgeon: Dominica Severin, MD;  Location: [facility] ORS;  Service: Orthopedics;  Laterality: Left;  . TONSILLECTOMY       OB History   No obstetric history on file.      Home Medications    Prior to Admission medications   Medication Sig Start Date End Date Taking? Authorizing Provider  acetaminophen (TYLENOL) 500 MG tablet Take 1 tablet (500 mg total) by  mouth every 6 (six) hours as needed for mild pain. Patient taking differently: Take 1,000 mg by mouth every 6 (six) hours as needed for mild pain.  03/28/18  Yes Rolly Salter, MD  albuterol (PROVENTIL HFA;VENTOLIN HFA) 108 (90 Base) MCG/ACT inhaler Inhale 2 puffs into the lungs every 4 (four) hours as needed for wheezing or shortness of breath.   Yes [provider]  aluminum-magnesium hydroxide-simethicone (MAALOX) 200-200-20 MG/5ML SUSP Take 30 mLs by mouth 4 (four) times daily as needed (indigestion).    Yes [provider]  chlordiazePOXIDE (LIBRIUM) 25 MG capsule  PO TID x 1D, then 25-50mg  PO BID X 1D, then 25-50mg  PO QD X 1D Patient taking differently: Take 25-50 mg by mouth daily as needed for anxiety or withdrawal.  04/17/18  Yes McDonald, Mia A, PA-C  diltiazem (CARDIZEM CD) 120 MG 24 hr capsule Take 1 capsule (120 mg total) by mouth daily for 30 days. 04/14/18 05/14/18 Yes Tegeler, Canary Brim, MD  FLUoxetine (PROZAC) 20 MG capsule Take 1 capsule (20 mg total) by  mouth daily for 30 doses. 04/14/18 05/14/18 Yes Tegeler, Canary Brim, MD  Fluticasone-Salmeterol [facility] INHUB) 250-50 MCG/DOSE AEPB Inhale 2 puffs into the lungs 2 (two) times daily.    Yes [provider]  furosemide (LASIX) 40 MG tablet Take 40 mg twice daily till 04/02/2018, after that take 40 mg daily. Patient taking differently: Take 40 mg by mouth daily. Take 40 mg twice daily till 04/02/2018, after that take 40 mg daily. 04/14/18  Yes Tegeler, Canary Brim, MD  gabapentin (NEURONTIN) 100 MG capsule Take 2 capsules (200 mg total) by mouth 3 (three) times daily for 30 days. 04/14/18 05/14/18 Yes Tegeler, Canary Brim, MD  hydrOXYzine (ATARAX/VISTARIL) 25 MG tablet Take 1 tablet (25 mg total) by mouth daily as needed for up to 30 doses (anxiety). Patient taking differently: Take 25 mg by mouth daily as needed for anxiety (withdrawal).  04/14/18  Yes Tegeler, Canary Brim, MD  ipratropium-albuterol (DUONEB)  0.5-2.5 (3) MG/3ML SOLN Take 3 mLs by nebulization 2 (two) times daily. AND every 4 hours as needed for wheezing/shortness of breath   Yes [provider]  lidocaine (LIDODERM) 5 % Place 1 patch onto the skin daily. Remove & Discard patch within 12 hours or as directed by MD 04/09/18  Yes Palumbo, April, MD  magnesium oxide (MAG-OX) 400 MG tablet Take 400 mg by mouth daily.   Yes [provider]  nitrofurantoin, macrocrystal-monohydrate, (MACROBID) 100 MG capsule Take 1 capsule (100 mg total) by mouth 2 (two) times daily for 5 days. 04/14/18 04/19/18 Yes Tegeler, Canary Brim, MD  ondansetron (ZOFRAN-ODT) 4 MG disintegrating tablet Take 1 tablet (4 mg total) by mouth every 8 (eight) hours as needed for nausea or vomiting. 04/14/18  Yes Tegeler, Canary Brim, MD  pantoprazole (PROTONIX) 40 MG tablet Take 1 tablet (40 mg total) by mouth daily for 30 days. 04/14/18 05/14/18 Yes Tegeler, Canary Brim, MD  rizatriptan (MAXALT) 5 MG tablet Take 5 mg by mouth as needed for migraine. May repeat in 2 hours if needed; not to exceed 30mg  in 24 hours   Yes [provider]  spironolactone (ALDACTONE) 25 MG tablet Take 1 tablet (25 mg total) by mouth daily for 30 days. 04/14/18 05/14/18 Yes Tegeler, Canary Brim, MD  folic acid (FOLVITE) 1 MG tablet Take 1 tablet (1 mg total) by mouth daily for 30 days. Patient not taking: Reported on 04/19/2018 04/12/18 05/12/18  Dimple Nanas, MD  lactulose (CHRONULAC) 10 GM/15ML solution Take 45 mLs (30 g total) by mouth 3 (three) times daily for 30 days. Patient not taking: Reported on 04/19/2018 04/14/18 05/14/18  Tegeler, Canary Brim, MD  magnesium chloride (SLOW-MAG) 64 MG TBEC SR tablet Take 1 tablet (64 mg total) by mouth 2 (two) times daily for 5 days. Patient not taking: Reported on 04/19/2018 04/17/18 04/22/18  McDonald, Mia A, PA-C  potassium chloride SA (K-DUR,KLOR-CON) 20 MEQ tablet Take 1 tablet (20 mEq total) by mouth 2 (two) times daily for 5  days. Patient not taking: Reported on 04/19/2018 04/17/18 04/22/18  McDonald, Mia A, PA-C  predniSONE (DELTASONE) 20 MG tablet 3 tabs po day one, then 2 po daily x 4 days Patient not taking: Reported on 04/19/2018 04/09/18   Palumbo, April, MD  thiamine 100 MG tablet Take 1 tablet (100 mg total) by mouth daily for 30 days. Patient not taking: Reported on 04/19/2018 04/14/18 05/14/18  Tegeler, Canary Brim, MD    Family History Family History  Problem Relation Age of Onset  .  Arrhythmia Mother   . Neuropathy Mother   . Coronary artery disease Mother   . Heart disease Mother   . Heart disease Father   . Hypertension Father   . Heart attack Father   . Multiple sclerosis Maternal Aunt     Social History Social History   Tobacco Use  . Smoking status: Current Every Day Smoker    Packs/day: 1.00    Years: 35.00    Pack years: 35.00    Types: Cigarettes  . Smokeless tobacco: Never Used  . Tobacco comment: Pt states smoked this morning  Substance Use Topics  . Alcohol use: Yes    Comment: 1 bottle of wine per day per patient.   . Drug use: No     Allergies   Patient has no known allergies.   Review of Systems Review of Systems  Constitutional: Negative for chills and fever.  Respiratory: Positive for shortness of breath. Negative for cough.   Cardiovascular: Negative for chest pain.  Gastrointestinal: Negative for abdominal pain, diarrhea, nausea and vomiting.  Genitourinary: Negative for dysuria, frequency and hematuria.  All other systems reviewed and are negative.    Physical Exam Updated Vital Signs BP 118/77   Pulse 82   Temp 98.2 F (36.8 C)   Resp 19   SpO2 94%   Physical Exam Vitals signs and nursing note reviewed.  Constitutional:      General: She is not in acute distress.    Appearance: She is well-developed. She is obese. She is not diaphoretic.     Comments: Disheveled appearance.  Somnolent, but easily arousable.  HENT:     Head: Normocephalic and  atraumatic.     Mouth/Throat:     Mouth: Mucous membranes are dry.     Pharynx: Oropharynx is clear.  Eyes:     Conjunctiva/sclera: Conjunctivae normal.     Pupils: Pupils are equal, round, and reactive to light.  Neck:     Musculoskeletal: Neck supple.  Cardiovascular:     Rate and Rhythm: Normal rate and regular rhythm.     Pulses:          Radial pulses are 2+ on the right side and 2+ on the left side.       Posterior tibial pulses are 2+ on the right side and 2+ on the left side.     Heart sounds: Normal heart sounds.     Comments: Tactile temperature in the extremities appropriate and equal bilaterally. Pulmonary:     Breath sounds: Decreased breath sounds and wheezing present.     Comments: SPO2 98 to 100% on 3 L supplemental O2. Abdominal:     Palpations: Abdomen is soft.     Tenderness: There is no abdominal tenderness. There is no guarding.  Musculoskeletal:     Right lower leg: Edema present.     Left lower leg: Edema present.     Comments: Bilateral lower extremity pitting edema almost to the level of the knees.  Lymphadenopathy:     Cervical: No cervical adenopathy.  Skin:    General: Skin is warm and dry.  Neurological:     Mental Status: She is alert.     Comments: Sensation to light touch grossly intact in the extremities. Patient will not answer orientation questions. No noted facial droop. She needs complete assistance to sit up in the bed. Moves each of the extremities.  After some time in the ED, patient was reassessed.  She is now more alert.  Patient still needs assistance to sit upright in the bed. Strength 4/5 in the bilateral biceps/triceps. Strength 3/5 in the bilateral hips and knees.  Psychiatric:        Mood and Affect: Mood and affect normal.        Speech: Speech normal.        Behavior: Behavior normal.      ED Treatments / Results  Labs (all labs ordered are listed, but only abnormal results are displayed) Labs Reviewed  RAPID URINE  DRUG SCREEN, HOSP PERFORMED - Abnormal; Notable for the following components:      Result Value   Benzodiazepines POSITIVE (*)    All other components within normal limits  CBC - Abnormal; Notable for the following components:   WBC 2.9 (*)    RBC 3.83 (*)    Hemoglobin 9.0 (*)    HCT 30.9 (*)    MCH 23.5 (*)    MCHC 29.1 (*)    RDW 19.3 (*)    Platelets 94 (*)    All other components within normal limits  COMPREHENSIVE METABOLIC PANEL - Abnormal; Notable for the following components:   Potassium 2.5 (*)    Chloride 89 (*)    CO2 37 (*)    BUN <5 (*)    Creatinine, Ser 0.42 (*)    Calcium 7.9 (*)    Total Protein 6.3 (*)    Albumin 3.4 (*)    All other components within normal limits  ACETAMINOPHEN LEVEL - Abnormal; Notable for the following components:   Acetaminophen (Tylenol), Serum <10 (*)    All other components within normal limits  ETHANOL - Abnormal; Notable for the following components:   Alcohol, Ethyl (B) 186 (*)    All other components within normal limits  BRAIN NATRIURETIC PEPTIDE - Abnormal; Notable for the following components:   B Natriuretic Peptide 231.2 (*)    All other components within normal limits  SALICYLATE LEVEL  MAGNESIUM  TROPONIN I   Hemoglobin  Date Value Ref Range Status  04/19/2018 9.0 (L) 12.0 - 15.0 g/dL Final  63/81/7711 9.3 (L) 12.0 - 15.0 g/dL Final  65/79/0383 9.9 (L) 12.0 - 15.0 g/dL Final  33/83/2919 8.7 (L) 12.0 - 15.0 g/dL Final    EKG EKG Interpretation  Date/Time:  Wednesday April 19 2018 08:17:51 EDT Ventricular Rate:  82 PR Interval:    QRS Duration: 98 QT Interval:  437 QTC Calculation: 511 R Axis:   83 Text Interpretation:  Sinus rhythm Borderline repolarization abnormality Prolonged QT interval similar to prior yesterday Confirmed by Meridee Score [phone]) on 04/19/2018 8:20:35 AM Also confirmed by Meridee Score [phone]), editor Barbette Hair [phone])  on 04/19/2018 1:28:21 PM   Radiology Ct Head Wo  Contrast  Result Date: 04/18/2018 CLINICAL DATA:  Altered mental status with recent fall. EXAM: CT HEAD WITHOUT CONTRAST TECHNIQUE: Contiguous axial images were obtained from the base of the skull through the vertex without intravenous contrast. COMPARISON:  April 14, 2018 FINDINGS: Brain: There is mild diffuse atrophy. There is no intracranial mass, hemorrhage, extra-axial fluid collection, or midline shift. There is patchy small vessel disease throughout portions of the centra semiovale bilaterally, stable. No acute appearing infarct is evident. Vascular: No hyperdense vessel. Foci of calcification are noted in each carotid siphon region. Skull: The bony calvarium appears intact. Sinuses/Orbits: There is a retention cyst in the right maxillary antrum. There is mild mucosal thickening in anterior ethmoid air cells. Other visualized paranasal sinuses are clear. Orbits appear  symmetric bilaterally. Other: Mastoids bilaterally are somewhat hypoplastic but clear. IMPRESSION: Stable atrophy with patchy periventricular small vessel disease. No acute infarct is demonstrable. No mass or hemorrhage. Foci of arterial vascular calcification noted. Areas of mild paranasal sinus disease noted. Electronically Signed   By: Bretta Bang III M.D.   On: 04/18/2018 13:19   Dg Chest Port 1 View  Result Date: 04/18/2018 CLINICAL DATA:  Shortness of breath, hypoxia, CHF. EXAM: PORTABLE CHEST 1 VIEW COMPARISON:  04/16/2018 FINDINGS: The cardiac silhouette remains enlarged. Aortic atherosclerosis is noted. The lungs are mildly hypoinflated with mild central pulmonary vascular congestion. No overt edema, airspace consolidation, sizeable pleural effusion, or pneumothorax is identified. No acute osseous abnormality is seen. IMPRESSION: Cardiomegaly and mild hypoinflation with mild pulmonary vascular congestion. Electronically Signed   By: Sebastian Ache M.D.   On: 04/18/2018 13:28    Procedures Procedures (including critical care  time)  Medications Ordered in ED Medications  thiamine (VITAMIN B-1) tablet 100 mg (100 mg Oral Given 04/19/18 1028)    Or  thiamine (B-1) injection 100 mg ( Intravenous See Alternative 04/19/18 1028)  nicotine (NICODERM CQ - dosed in mg/24 hours) patch 21 mg (21 mg Transdermal Patch Applied 04/19/18 1028)  SUMAtriptan (IMITREX) injection 6 mg (has no administration in time range)  ondansetron (ZOFRAN) injection 4 mg (has no administration in time range)  ipratropium-albuterol (DUONEB) 0.5-2.5 (3) MG/3ML nebulizer solution 3 mL (3 mLs Nebulization Given 04/19/18 0930)  methylPREDNISolone sodium succinate (SOLU-MEDROL) 125 mg/2 mL injection 125 mg (125 mg Intravenous Given 04/19/18 0934)  potassium chloride 10 mEq in 100 mL IVPB ( Intravenous Stopped 04/19/18 1116)  potassium chloride SA (K-DUR,KLOR-CON) CR tablet 40 mEq (40 mEq Oral Given 04/19/18 0938)  acetaminophen (TYLENOL) tablet 650 mg (650 mg Oral Given 04/19/18 1027)  ondansetron (ZOFRAN) injection 4 mg (4 mg Intravenous Given 04/19/18 1405)  potassium chloride 10 mEq in 100 mL IVPB ( Intravenous Stopped 04/19/18 1531)     Initial Impression / Assessment and Plan / ED Course  I have reviewed the triage vital signs and the nursing notes.  Pertinent labs & imaging results that were available during my care of the patient were reviewed by me and considered in my medical decision making (see chart for details).  Clinical Course as of Apr 19 1707  Wed Apr 19, 2018  0918 Previous values are noted to be within this range.  Platelets(!): 94 [SJ]  0918 Seems to be consistent with previous values.  Chloride(!): 89 [SJ]  0930 Patient is now more alert.  Noted to have SPO2 84 to 85% on room air.  Following DuoNeb treatment, lung sounds are less diminished and global inspiratory and expiratory wheezing more prominent.  Patient now admits to drinking at least a bottle of wine a day with last alcohol "sometime last night."  Denies recent fever, acute  cough, abdominal pain, N/V/C/D, syncope. I asked her why she left AMA yesterday and she states, "I do not really know.  Sometimes we do things we do not understand."   [SJ]  1205 Psychiatry has cleared the patient and Dr. Sharma Covert has rescinded the IVC.  They recommend contact with peer support.   [SJ]  1210 Spoke with Denny Peon, Social work. States she will speak with the patient about options following discharge, possible placement, etc after patient is dispositioned today.   [SJ]  3769 58 year old female brought in on IVC for unable to care for self.  It sounds like her home situation is very  unsafe and the patient is not doing a very good job taking care of herself.  She has COPD and is got some wheezing and also by lab work is hypokalemic.  She will need a medical admission and possibly placement.  Psychiatry has cleared her IVC.  Currently she is being evaluated for guardianship.   [MB]  1229 Spoke with Dr. Mahala Menghini, hospitalist. Agrees to come see the patient.   [SJ]  1245 Dr. Mahala Menghini evaluated the patient. States all of her issues are chronic and she does not meet admission criteria.    [SJ]  1319 Spoke with Denny Peon, Child psychotherapist. States we don't have a placement avenue for this patient. DSS stopped by to evaluate for guardianship. This will take several days at least and the recommendation is for discharge while evaluation is being completed.  Patient has shown she is a risk to home caregiver so home health companies will not take her on as a patient.  Because she is an active alcoholic, skilled nursing facilities and assisted living facilities will not accept her.   [SJ]  1326 Spoke with Denny Peon again. States we can try putting in a PT consult for the patient to evaluate patient's abilities.    [SJ]  1520 Spoke with Jonny Ruiz, Peer support. States he will plan on seeing the patient after PT evaluates her.  Rehab facilities will not take the patient if she is unable to move around and perform ADLs.   [SJ]   1655 Patient requesting more Zofran. States she thinks she might want to just go home because she wants to drink.   [SJ]    Clinical Course User Index [MB] Terrilee Files, MD [SJ] Anselm Pancoast, PA-C       Patient presents today under IVC.  However, she also complains of shortness of breath. She was seen in the ED yesterday for altered mental status and shortness of breath.  She was admitted due to hypoxia and hypokalemia, but left AMA prior to transportation out of the ED. When I spoke with the patient today, she states she feels as though she needs to be here.  She has concerns about her ability to continue to properly care for herself at home.  However, after chart review as well as talking with social work, it seems as though patient has had multiple consults placed to try to assist her.  Social work states patient has threatened many home health care providers and therefore this is no longer an option for her.  SNF placement is not an option for as long as patient continues to abuse alcohol. Patient has hypokalemia, but this has been noted on the previous visits.  She has been previously prescribed potassium supplementation, but has been yet noncompliant.  Chart review seems to show chronic noncompliance. She is noted to be hypoxic on room air, however, this seems to be her baseline and she confirms that she has supplemental O2 at home, she just chooses not to wear it. Patient is able to ambulate here with little to no assistance.  She states she has a walker at home, but also chooses not to use this most of the time.  Findings and plan of care discussed with Erasmo Score, MD. Dr. Charm Barges personally evaluated and examined this patient.  End of shift patient care handoff report given to Terance Hart, PA-C. Plan: PT to evaluate patient. May recontact SW after PT gives their recommendations.   Final Clinical Impressions(s) / ED Diagnoses   Final diagnoses:  Hypokalemia  Generalized  weakness    ED Discharge Orders    None       Concepcion Living 04/19/18 1710    Terrilee Files, MD 04/19/18 1815

## 2018-04-19 NOTE — BH Assessment (Signed)
[facility] Assessment Progress Note  Per Juanetta Beets, DO, this pt does not require psychiatric hospitalization at this time.  Pt presents under IVC initiated by law enforcement, which Dr Sharma Covert has rescinded.  Pt is psychiatrically cleared.  Discharge instructions advise pt to follow up with [facility] for residential, or with [facility] for outpatient substance abuse treatment.  This has been included in pt's discharge instructions.  Pt would also benefit from seeing Peer Support Specialists; they will be asked to speak to pt.  Pt's nurse has been notified.  Doylene Canning, MA Triage Specialist [phone]

## 2018-04-19 NOTE — Patient Outreach (Signed)
CPSS met with the patient to provide substance use recovery support and help with substance use treatment resources. Patient is interested in residential substance use treatment. CPSS provided and talked about doing a possible referral for residential substance use treatment centers, but the patient is demanding to go home. CPSS provided and explained information regarding [place], [place], and [facility] for possible residential substance treatment center placement. Patient states she can not go to [facility] for residential substance use treatment due to medical reasons. CPSS also provided and explained information regarding the [place] and suggested the patient try outpatient substance use treatment due to the patient being denied from [facility]. Several substance use treatment resources were provided to the patient including a residential/outpatient substance use treatment center list, AA meeting list, and CPSS contact information. CPSS strongly encouraged the patient to keep in contact with CPSS for substance use recovery support and help with substance use treatment resources if needed after discharge from the [facility].

## 2018-04-19 NOTE — Progress Notes (Addendum)
Patient ID: Anne Black, female   DOB: 1960/10/15, 58 y.o.   MRN: [id number]  Pt was seen and chart reviewed with treatment team and Dr Sharma Covert. Pt denies suicidal/homicidal ideation, denies auditory/visual hallucinations and does not appear to be responding to internal stimuli. Pt stated she has been drinking "too much" and she wants to go to rehab for alcohol abuse. She is well known to this emergency room system and has had 14 visits in 6 months and 6 hospital admissions. She was brought in by law enforcement who IVC'd her for multiples calls to 911. She was seen at [facility] on 04-09-2018 and left AMA. She has multiple health issues and frequently requires medical intervention. Her potassium was at critical low of 2.5 this admission. This has been treated with IV potassium and oral potassium. She is oxygen dependent and is on home O2, she has a history of COPD, a-fib, narcolepsy, cirrhosis, MS, and Bipolar disorder. She had a BAL of 186, UDS negative on admission. Today she stated she wants to go to rehab for her drinking and requested to see Peer Support for resources. Pt is psychiatrically clear.   Laveda Abbe, FNP-C 04/19/2018          1145  Patient seen face-to-face for psychiatric evaluation, chart reviewed and case discussed with the physician extender and developed treatment plan. Reviewed the information documented and agree with the treatment plan.  Juanetta Beets, DO 04/19/18 1:28 PM

## 2018-04-19 NOTE — ED Triage Notes (Signed)
Pt came to the ED from home due to intoxications.  Pt has law enforcement present for IVC.  Pt refused all vitals and treatment on the way to the ED

## 2018-04-19 NOTE — Discharge Instructions (Addendum)
To help you maintain a sober lifestyle, a substance abuse treatment program may be beneficial to you.  Contact one of the following facilities at your earliest opportunity to ask about enrolling:  RESIDENTIAL PROGRAMS:       [facility] [address]      [address]      [place], [place] [address]      [phone]  OUTPATIENT PROGRAMS:       [facility]      [address] [place], [place] [address]      [phone]

## 2018-04-19 NOTE — ED Triage Notes (Signed)
Patient brought in by [facility] and [employer]. Patient is was IVC'd and that she can not take of herself. Patient has called more than 7 times today.

## 2018-04-19 NOTE — ED Notes (Signed)
Patient sitting in hallway when writer returned from transporting another patient. Pt asking for a cab voucher and nausea medicine. Pt informed that [facility] has been called and she was given nausea medicine one hr ago. Writer asked pt why she took her oxygen off and is sitting in hallway. Pt stated bc "I feel so nauseas." Pt assisted back into room, o2 placed on pt, and placed on monitor. Writer explained to pt that she cannot have a cab voucher because she does not have her home o2 for transportation. Writer will inform EDP of pts continued nausea.

## 2018-04-19 NOTE — ED Provider Notes (Signed)
Care signed out to me by S Joy PA-C. She is a 58 year old female with frequent ED visits who presents under IVC from the [facility] due to abuse of EMS. Has vague complaints here. Was here yesterday and was admitted for hypoxia and AMS but ended up leaving AMA. [facility] was consulted again today and pt was seen by Dr. Suzzanne Cloud who recommended discharge home with prescription for potassium because a lot of her problems are chronic and mostly due to non-compliance. SW, psych, and peer support have been involved as well. APS is working on obtaining guardianship of patient. She does not qualify for SNF because she is ambulatory although her sats drop with exertion (this is also a chronic problem and she is on home O2). PT attempted to evaluation pt but pt was demanding meds for anxiety and nausea before eval could be completed. She was noted to be able to walk around the room with minimal assistance.   5:54 PM Now pt is requesting d/c. Rx for Potassium was printed.     Anne Born, PA-C 04/19/18 1756    Jacalyn Lefevre, MD 04/19/18 1806

## 2018-04-19 NOTE — ED Notes (Signed)
Bed: CY81 Expected date:  Expected time:  Means of arrival:  Comments: Rm. 5 transfer

## 2018-04-19 NOTE — BH Assessment (Signed)
Tele Assessment Note   Patient Name: Anne Black MRN: [id number] Referring Physician: [facility] Location of Patient: [facility] Location of Provider: Behavioral Health TTS Department  Anne Black is a 58 y.o. female who presents to [facility] with a history of alcohol abuse, A. fib, COPD, bipolar d/o. 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  Pt is on supplemental O2 at home.  She complains of shortness of breath, but will not further characterize this.  Does state she has had increased lower extremity edema bilaterally. Pt admits frequent alcohol use, but will not detail how much. Pt denies AVH/SI/HI. Pt acknowledges multiple symptoms of Depression, including increased isolating, anhedonia, & worthlessness.  Pt lives alone with little support. APS is attempting to gain guardianship of pt.   MSE: Pt is casually dressed, alert, oriented x4 with normal speech and normal motor behavior. Eye contact is good. Pt's mood is depressed and affect is depressed and anxious. Affect is congruent with mood. Thought process is coherent and relevant. There is no indication Pt is currently responding to internal stimuli or experiencing delusional thought content. Pt was cooperative throughout assessment.    Disposition: Anne Guadeloupe, NP recommends pt be psychiatrically cleared. Pt to be seen by peer support  Diagnosis: F10.2 Alcohol Dependence  Past Medical History:  Past Medical History:  Diagnosis Date  . ADHD (attention deficit hyperactivity disorder)   . Alcoholic cirrhosis (HCC)   . Alcoholism (HCC)   . Allergies   . Anxiety   . Arthritis   . Ascites   . Asthma   . Atrial fibrillation with RVR (HCC)   . Bipolar disorder (HCC)   . COPD (chronic obstructive pulmonary disease) (HCC)   . Depression   . Dyspnea   . Dysrhythmia   . GERD (gastroesophageal  reflux disease)   . History of hiatal hernia   . Hypokalemia   . Migraine   . Multiple sclerosis (HCC)   . Narcolepsy   . Osteoarthritis of knee   . Osteoporosis   . Pneumonia     Past Surgical History:  Procedure Laterality Date  . ABDOMINAL WALL DEFECT REPAIR N/A 12/30/2016   Procedure: EXPLORATORY LAPAROTOMY WITH REPAIR ABDOMINAL WALL VENTRAL HERNIA;  Surgeon: [name], MD;  Location: [facility] OR;  Service: General;  Laterality: N/A;  . APPLICATION OF WOUND VAC N/A 12/30/2016   Procedure: APPLICATION OF WOUND VAC;  Surgeon: [name], MD;  Location: [facility] OR;  Service: General;  Laterality: N/A;  . CESAREAN SECTION  [phone]  . FRACTURE SURGERY    . HERNIA REPAIR    . MYRINGOTOMY WITH TUBE PLACEMENT Bilateral   . ORIF WRIST FRACTURE Left 09/09/2016   Procedure: OPEN REDUCTION INTERNAL FIXATION (ORIF) LEFT WRIST FRACTURE, LEFT CARPAL TUNNEL RELEASE;  Surgeon: Dominica Severin, MD;  Location: [facility]S;  Service: Orthopedics;  Laterality: Left;  . TONSILLECTOMY      Family History:  Family History  Problem Relation Age of Onset  . Arrhythmia Mother   . Neuropathy Mother   . Coronary artery disease Mother   . Heart disease Mother   . Heart disease Father   . Hypertension Father   . Heart attack Father   . Multiple sclerosis Maternal Aunt     Social History:  reports that she has been smoking cigarettes. She has a 35.00 pack-year smoking history.  She has never used smokeless tobacco. She reports current alcohol use. She reports that she does not use drugs.  Additional Social History:     CIWA: CIWA-Ar BP: 108/64 Pulse Rate: 73 Nausea and Vomiting: mild nausea with no vomiting Tactile Disturbances: none Tremor: no tremor Auditory Disturbances: not present Paroxysmal Sweats: no sweat visible Visual Disturbances: not present Anxiety: mildly anxious Headache, Fullness in Head: moderately severe Agitation: normal activity Orientation and Clouding of Sensorium:  oriented and can do serial additions CIWA-Ar Total: 6 COWS:    Allergies: No Known Allergies  Home Medications: (Not in a hospital admission)   OB/GYN Status:  No LMP recorded. Patient is postmenopausal.  General Assessment Data Assessment unable to be completed: Yes Reason for not completing assessment: multiple assessments at same time Location of Assessment: [facility] TTS Assessment: In system Is this a Tele or Face-to-Face Assessment?: Face-to-Face Is this an Initial Assessment or a Re-assessment for this encounter?: Initial Assessment Patient Accompanied by:: N/A Language Other than English: No Living Arrangements: Other (Comment) What gender do you identify as?: Female Living Arrangements: Alone Can pt return to current living arrangement?: (unknown at this time) Is patient capable of signing voluntary admission?: Yes Referral Source: MD Insurance type: [employer]     Crisis Care Plan Living Arrangements: Alone     Risk to self with the past 6 months Suicidal Ideation: No(pt denies) Suicidal Intent: No Has patient had any suicidal intent within the past 6 months prior to admission? : No Is patient at risk for suicide?: Yes Suicidal Plan?: No Has patient had any suicidal plan within the past 6 months prior to admission? : No What has been your use of drugs/alcohol within the last 12 months?: daily alcohol Previous Attempts/Gestures: (UTA) How many times?: (UTA) Other Self Harm Risks: falling due to alcohol intoxication Intentional Self Injurious Behavior: (excessive alcohol use) Family Suicide History: Unable to assess Recent stressful life event(s): Recent negative physical changes Persecutory voices/beliefs?: No Depression: Yes Depression Symptoms: Isolating, Loss of interest in usual pleasures, Tearfulness, Feeling worthless/self pity Substance abuse history and/or treatment for substance abuse?: No  Risk to Others within the past 6 months Homicidal Ideation:  No Does patient have any lifetime risk of violence toward others beyond the six months prior to admission? : Unknown Thoughts of Harm to Others: No Current Homicidal Intent: No Current Homicidal Plan: No Access to Homicidal Means: No History of harm to others?: No Assessment of Violence: None Noted  Psychosis Hallucinations: None noted Delusions: None noted  Mental Status Report Appearance/Hygiene: Disheveled Eye Contact: Poor Motor Activity: Freedom of movement Speech: Slurred, Slow, Soft, Incoherent Level of Consciousness: Sedated, Drowsy, Sleeping Mood: Depressed Affect: Depressed Anxiety Level: None Thought Processes: Unable to Assess Judgement: Unable to Assess Orientation: Person, Place, Situation Obsessive Compulsive Thoughts/Behaviors: None  Cognitive Functioning Concentration: Poor Memory: Unable to Assess Is patient IDD: No Insight: Poor Impulse Control: Poor Appetite: (UTA) Sleep: Increased Vegetative Symptoms: Decreased grooming  ADLScreening [facility] Assessment Services) Patient's cognitive ability adequate to safely complete daily activities?: No Patient able to express need for assistance with ADLs?: Yes Independently performs ADLs?: No  Prior Inpatient Therapy Prior Inpatient Therapy: No  Prior Outpatient Therapy Prior Outpatient Therapy: (UTA)  ADL Screening (condition at time of admission) Patient's cognitive ability adequate to safely complete daily activities?: No Is the patient deaf or have difficulty hearing?: No Does the patient have difficulty seeing, even when wearing glasses/contacts?: No Does the patient have difficulty concentrating, remembering, or making decisions?:  Yes Patient able to express need for assistance with ADLs?: Yes Does the patient have difficulty dressing or bathing?: Yes Independently performs ADLs?: No Communication: Needs assistance Dressing (OT): Needs assistance Grooming: Needs assistance Feeding: Needs  assistance Bathing: Needs assistance Toileting: Needs assistance In/Out Bed: Needs assistance Walks in Home: Needs assistance Does the patient have difficulty walking or climbing stairs?: Yes Weakness of Legs: Both  Home Assistive Devices/Equipment Home Assistive Devices/Equipment: Oxygen        Consults Social Work Consult Needed: Yes (Comment) Advance Directives (For Healthcare) Does Patient Have a Medical Advance Directive?: No Would patient like information on creating a medical advance directive?: No - Patient declined          Disposition: Anne Guadeloupe, NP recommends pt be psychiatrically cleared Disposition Initial Assessment Completed for this Encounter: Yes    Zander Ingham Suzan Nailer 04/19/2018 3:10 PM

## 2018-04-19 NOTE — Progress Notes (Addendum)
Pt is well-known to this ED [facility] ED) and visits frequently and is unable to pay for meds and transport home.  CSW received a call from PT/Peer Support requesting collateral on pt's wishes regarding disposition.  CSW will seek info from the pt on:  1. Pt's wishes regarding SNF/Home disposition.  2. Pt's insurance's ability to pay for SNF/pt's ability to private pay.  CSW will update PT/Peer Support at ph: [phone].  CSW staffed with [place] Dept who confirms pt gets worked up for SNF and leaves AMA on the medical fllor and in addition pt has [place] [employer]AP which in general does not pay for SNF REHAB, making it extremely unlimkely, especially if pt is ambulatory, per [place] Dept staff who has worked for the pt..  Of note: Per previous note by ED CSW today; CSW met with patients APS worker with [facility], [place], who stated DSS is attempting to get guardianship of patient at this time however paperwork has not yet been completed and they are unsure if guardianship with be awarded due to fluctuating orientation.   4:55 PM  CSW spoke to registration and confirmed pt has Medicare A in addition to [place], but per CSW Dept that is part of the Transition of Care team that were staffed, Medicare A only pays for SNF room and board and that meds and PT/OT and other SNF needs must be paid for privately (cash) before a SNF will accept a pt for a rehab stay.  CSW asked pt what pt wanted in regards to disposition and pt intially stated rehab for alcoholism/SU but disclosed a tx center in [place] encouraged pt to receive medical care for needs first if needed.    Pt then stated she wanted short-term rehab but then asked if she can go home first to pack prior to a D/C to a SNF rehab.  CSW stated this would be an example of the pt leaving AMA if the pt WERE found to be appropriate for a SNF placement, and that the chart indicates that pt has left AMA in the past and IF pt is found to be appropriate  for SNF placement then if pt left before placement pt would be considered leaving AMA.    CSW the informed pt of barriers: 1.  Pt is ambulatory. 2. Pt's insurance unlikely to pay. 3. Pt's HX of alcoholism/leaving AMA.  Pt was agreeable to seeing Peer Support for possible SUD [place] requested Peer Support see pt for possible SUD [place], peer support did and pt insisted pt wants to go home.  Peer Support states peer support informed PA and PA is aware.    CSW spoke to PT who states pt was unable to complete PT recommendation due to pt asking for too many needs to be met, ie Need to see a MD for zofran, need to see a RN, asking PT to not leave room because pt was experiencing anxiety, etc.  CSW will continue to follow for D/C needs.  Alphonse Guild. Pantera [place]rowd, LCSW, LCAS, CSI Clinical Social Worker Ph: [phone]

## 2018-04-19 NOTE — Progress Notes (Addendum)
58 year old Caucasian female Chronic ethanolism with cirrhosis Bipolar COPD ADHD Paroxysmal A. fib Reflux Multiple emergency room visits Actually came to emergency room 3/10 with somnolence bilateral lower extremity edema chest x-ray cardiomegaly and was slightly hypoxic needing 1 to 2 L of nasal cannula Left AGAINST MEDICAL ADVICE Comes back as police called 7-14 times by the patient and was IVC on route Psychiatry saw the patient and cleared the patient in the ED  Patient cannot tell me other than "I fell" why she fell she does not give details of the fall it is unclear whether she is supposed to be using a walker  She complains of some vague pains in limbs and some mild tightness in her chest she is still a smoker She has no nausea or vomiting  Occasionally she is somnolent but wakes right back up At rest she is in the 94th percentile on her oxygen but this drops with activity to around the 80th percentile EOMI NCAT older than stated age no icterus no pallor abdomen soft nontender no rales no rhonchi on chest exam no lower extremity edema range of motion intact neurologically intact power 5/5  I have reviewed her labs and she does have hypokalemia of 2.5 she was given 1 round of potassium and 40 of potassium in the emergency room She was able to ambulate to the restroom with minimal assistance and insisted on going to the restroom while I was interviewing her She exhibited reasonable power and although was a little unsteady does have a walker at home  I feel that other than her hypokalemia, she would probably not qualify for skilled facility or IP hosptial admission I spoke to social worker who informed me that APS is working on getting guardianship of her given her multiple visits to the emergency room I am hopeful that she will be able to stay out of the ED and I have spoken to the PA with the emergency room who I have asked to give a prescription for potassium on discharge I have  also asked the patient to try and contact her family members if she can arrange to have some amount of care added  Her prognosis is overall guarded

## 2018-04-20 ENCOUNTER — Inpatient Hospital Stay: Payer: Self-pay | Admitting: Family Medicine

## 2018-04-24 ENCOUNTER — Encounter (HOSPITAL_COMMUNITY): Payer: Self-pay

## 2018-04-24 ENCOUNTER — Emergency Department (HOSPITAL_COMMUNITY): Payer: 59

## 2018-04-24 ENCOUNTER — Ambulatory Visit (HOSPITAL_BASED_OUTPATIENT_CLINIC_OR_DEPARTMENT_OTHER): Payer: 59

## 2018-04-24 ENCOUNTER — Other Ambulatory Visit: Payer: Self-pay

## 2018-04-24 ENCOUNTER — Observation Stay (HOSPITAL_COMMUNITY)
Admission: EM | Admit: 2018-04-24 | Discharge: 2018-04-24 | Discharge status: Against Medical Advice | Payer: 59 | Attending: Internal Medicine | Admitting: Internal Medicine

## 2018-04-24 DIAGNOSIS — G35 Multiple sclerosis: Secondary | ICD-10-CM | POA: Insufficient documentation

## 2018-04-24 DIAGNOSIS — J449 Chronic obstructive pulmonary disease, unspecified: Secondary | ICD-10-CM | POA: Insufficient documentation

## 2018-04-24 DIAGNOSIS — E43 Unspecified severe protein-calorie malnutrition: Secondary | ICD-10-CM | POA: Diagnosis present

## 2018-04-24 DIAGNOSIS — W19XXXA Unspecified fall, initial encounter: Secondary | ICD-10-CM

## 2018-04-24 DIAGNOSIS — I4891 Unspecified atrial fibrillation: Secondary | ICD-10-CM | POA: Diagnosis present

## 2018-04-24 DIAGNOSIS — F10920 Alcohol use, unspecified with intoxication, uncomplicated: Secondary | ICD-10-CM

## 2018-04-24 DIAGNOSIS — F17210 Nicotine dependence, cigarettes, uncomplicated: Secondary | ICD-10-CM | POA: Insufficient documentation

## 2018-04-24 DIAGNOSIS — Z720 Tobacco use: Secondary | ICD-10-CM | POA: Diagnosis present

## 2018-04-24 DIAGNOSIS — F10229 Alcohol dependence with intoxication, unspecified: Principal | ICD-10-CM | POA: Insufficient documentation

## 2018-04-24 DIAGNOSIS — R609 Edema, unspecified: Secondary | ICD-10-CM | POA: Diagnosis not present

## 2018-04-24 DIAGNOSIS — Z79899 Other long term (current) drug therapy: Secondary | ICD-10-CM | POA: Insufficient documentation

## 2018-04-24 DIAGNOSIS — K219 Gastro-esophageal reflux disease without esophagitis: Secondary | ICD-10-CM | POA: Diagnosis present

## 2018-04-24 DIAGNOSIS — F319 Bipolar disorder, unspecified: Secondary | ICD-10-CM | POA: Diagnosis present

## 2018-04-24 DIAGNOSIS — F909 Attention-deficit hyperactivity disorder, unspecified type: Secondary | ICD-10-CM | POA: Insufficient documentation

## 2018-04-24 DIAGNOSIS — T148XXA Other injury of unspecified body region, initial encounter: Secondary | ICD-10-CM

## 2018-04-24 DIAGNOSIS — G35D Multiple sclerosis, unspecified: Secondary | ICD-10-CM | POA: Diagnosis present

## 2018-04-24 DIAGNOSIS — F1020 Alcohol dependence, uncomplicated: Secondary | ICD-10-CM | POA: Diagnosis present

## 2018-04-24 DIAGNOSIS — R269 Unspecified abnormalities of gait and mobility: Secondary | ICD-10-CM

## 2018-04-24 DIAGNOSIS — M810 Age-related osteoporosis without current pathological fracture: Secondary | ICD-10-CM | POA: Insufficient documentation

## 2018-04-24 DIAGNOSIS — E871 Hypo-osmolality and hyponatremia: Secondary | ICD-10-CM | POA: Diagnosis present

## 2018-04-24 DIAGNOSIS — G47419 Narcolepsy without cataplexy: Secondary | ICD-10-CM | POA: Insufficient documentation

## 2018-04-24 DIAGNOSIS — D539 Nutritional anemia, unspecified: Secondary | ICD-10-CM | POA: Diagnosis present

## 2018-04-24 LAB — CBC WITH DIFFERENTIAL/PLATELET
Abs Immature Granulocytes: 0.02 10*3/uL (ref 0.00–0.07)
Basophils Absolute: 0.1 10*3/uL (ref 0.0–0.1)
Basophils Relative: 1 %
Eosinophils Absolute: 0.2 10*3/uL (ref 0.0–0.5)
Eosinophils Relative: 2 %
HCT: 33.9 % — ABNORMAL LOW (ref 36.0–46.0)
Hemoglobin: 9.4 g/dL — ABNORMAL LOW (ref 12.0–15.0)
Immature Granulocytes: 0 %
Lymphocytes Relative: 12 %
Lymphs Abs: 0.8 10*3/uL (ref 0.7–4.0)
MCH: 22.9 pg — ABNORMAL LOW (ref 26.0–34.0)
MCHC: 27.7 g/dL — ABNORMAL LOW (ref 30.0–36.0)
MCV: 82.5 fL (ref 80.0–100.0)
Monocytes Absolute: 0.9 10*3/uL (ref 0.1–1.0)
Monocytes Relative: 15 %
Neutro Abs: 4.5 10*3/uL (ref 1.7–7.7)
Neutrophils Relative %: 70 %
Platelets: 93 10*3/uL — ABNORMAL LOW (ref 150–400)
RBC: 4.11 MIL/uL (ref 3.87–5.11)
RDW: 19.8 % — ABNORMAL HIGH (ref 11.5–15.5)
WBC: 6.4 10*3/uL (ref 4.0–10.5)
nRBC: 0 % (ref 0.0–0.2)

## 2018-04-24 LAB — COMPREHENSIVE METABOLIC PANEL
ALT: 16 U/L (ref 0–44)
AST: 23 U/L (ref 15–41)
Albumin: 3.3 g/dL — ABNORMAL LOW (ref 3.5–5.0)
Alkaline Phosphatase: 97 U/L (ref 38–126)
Anion gap: 10 (ref 5–15)
BUN: 5 mg/dL — ABNORMAL LOW (ref 6–20)
CO2: 33 mmol/L — ABNORMAL HIGH (ref 22–32)
Calcium: 8 mg/dL — ABNORMAL LOW (ref 8.9–10.3)
Chloride: 93 mmol/L — ABNORMAL LOW (ref 98–111)
Creatinine, Ser: 0.44 mg/dL (ref 0.44–1.00)
GFR calc Af Amer: >60 mL/min (ref >60)
GFR calc non Af Amer: >60 mL/min (ref >60)
Glucose, Bld: 106 mg/dL — ABNORMAL HIGH (ref 70–99)
Potassium: 3.6 mmol/L (ref 3.5–5.1)
Sodium: 136 mmol/L (ref 135–145)
Total Bilirubin: 1.8 mg/dL — ABNORMAL HIGH (ref 0.3–1.2)
Total Protein: 6.4 g/dL — ABNORMAL LOW (ref 6.5–8.1)

## 2018-04-24 LAB — BRAIN NATRIURETIC PEPTIDE: B Natriuretic Peptide: 461.5 pg/mL — ABNORMAL HIGH (ref 0.0–100.0)

## 2018-04-24 LAB — LIPASE, BLOOD: Lipase: 29 U/L (ref 11–51)

## 2018-04-24 LAB — CK: Total CK: 105 U/L (ref 38–234)

## 2018-04-24 LAB — ETHANOL: Alcohol, Ethyl (B): 138 mg/dL — ABNORMAL HIGH (ref <10)

## 2018-04-24 MED ORDER — ACETAMINOPHEN 650 MG RE SUPP: 650.0000 mg | Freq: Four times a day (QID) | RECTAL | Status: DC | PRN

## 2018-04-24 MED ORDER — ONDANSETRON HCL 4 MG/2ML IJ SOLN: 4.0000 mg | Freq: Four times a day (QID) | INTRAMUSCULAR | Status: DC | PRN

## 2018-04-24 MED ORDER — IBUPROFEN 200 MG PO TABS
600.0000 mg | ORAL_TABLET | Freq: Three times a day (TID) | ORAL | Status: DC | PRN
  Administered 2018-04-24: 600 mg via ORAL
  Filled 2018-04-24: qty 3

## 2018-04-24 MED ORDER — MAGNESIUM OXIDE 400 (241.3 MG) MG PO TABS: 400.0000 mg | ORAL_TABLET | Freq: Every day | ORAL | Status: DC

## 2018-04-24 MED ORDER — ONDANSETRON HCL 4 MG PO TABS: 4.0000 mg | ORAL_TABLET | Freq: Four times a day (QID) | ORAL | Status: DC | PRN

## 2018-04-24 MED ORDER — HYDROXYZINE HCL 25 MG PO TABS: 25.0000 mg | ORAL_TABLET | Freq: Every day | ORAL | Status: DC | PRN

## 2018-04-24 MED ORDER — ALPRAZOLAM 0.5 MG PO TABS
0.5000 mg | ORAL_TABLET | ORAL | Status: AC
  Administered 2018-04-24: 0.5 mg via ORAL
  Filled 2018-04-24: qty 1

## 2018-04-24 MED ORDER — MOMETASONE FURO-FORMOTEROL FUM 200-5 MCG/ACT IN AERO
2.0000 | INHALATION_SPRAY | Freq: Two times a day (BID) | RESPIRATORY_TRACT | Status: DC
  Filled 2018-04-24: qty 8.8

## 2018-04-24 MED ORDER — SODIUM CHLORIDE 0.9 % IV SOLN
INTRAVENOUS | Status: DC
  Administered 2018-04-24: 19:00:00 via INTRAVENOUS

## 2018-04-24 MED ORDER — SODIUM CHLORIDE 0.9 % IV BOLUS
1000.0000 mL | Freq: Once | INTRAVENOUS | Status: AC
  Administered 2018-04-24: 1000 mL via INTRAVENOUS

## 2018-04-24 MED ORDER — FUROSEMIDE 40 MG PO TABS
40.0000 mg | ORAL_TABLET | Freq: Every day | ORAL | Status: DC
  Administered 2018-04-24: 40 mg via ORAL
  Filled 2018-04-24: qty 1

## 2018-04-24 MED ORDER — SPIRONOLACTONE 25 MG PO TABS: 25.0000 mg | ORAL_TABLET | Freq: Every day | ORAL | Status: DC

## 2018-04-24 MED ORDER — ENOXAPARIN SODIUM 40 MG/0.4ML ~~LOC~~ SOLN: 40.0000 mg | SUBCUTANEOUS | Status: DC

## 2018-04-24 MED ORDER — FLUOXETINE HCL 20 MG PO CAPS
20.0000 mg | ORAL_CAPSULE | Freq: Every day | ORAL | Status: DC
  Administered 2018-04-24: 20 mg via ORAL
  Filled 2018-04-24: qty 1

## 2018-04-24 MED ORDER — NICOTINE 21 MG/24HR TD PT24
21.0000 mg | MEDICATED_PATCH | Freq: Every day | TRANSDERMAL | Status: DC
  Administered 2018-04-24: 21 mg via TRANSDERMAL
  Filled 2018-04-24: qty 1

## 2018-04-24 MED ORDER — VITAMIN B-1 100 MG PO TABS
100.0000 mg | ORAL_TABLET | Freq: Once | ORAL | Status: AC
  Administered 2018-04-24: 100 mg via ORAL
  Filled 2018-04-24: qty 1

## 2018-04-24 MED ORDER — ACETAMINOPHEN 325 MG PO TABS: 650.0000 mg | ORAL_TABLET | Freq: Four times a day (QID) | ORAL | Status: DC | PRN

## 2018-04-24 MED ORDER — BENZTROPINE MESYLATE 0.5 MG PO TABS
1.0000 mg | ORAL_TABLET | Freq: Four times a day (QID) | ORAL | Status: DC | PRN
  Administered 2018-04-24: 1 mg via ORAL
  Filled 2018-04-24: qty 1

## 2018-04-24 MED ORDER — PANTOPRAZOLE SODIUM 40 MG PO TBEC: 40.0000 mg | DELAYED_RELEASE_TABLET | Freq: Every day | ORAL | Status: DC

## 2018-04-24 MED ORDER — DILTIAZEM HCL ER COATED BEADS 120 MG PO CP24
120.0000 mg | ORAL_CAPSULE | Freq: Every day | ORAL | Status: DC
  Administered 2018-04-24: 120 mg via ORAL
  Filled 2018-04-24 (×2): qty 1

## 2018-04-24 MED ORDER — GABAPENTIN 100 MG PO CAPS
200.0000 mg | ORAL_CAPSULE | Freq: Three times a day (TID) | ORAL | Status: DC
  Administered 2018-04-24: 200 mg via ORAL
  Filled 2018-04-24: qty 2

## 2018-04-24 MED ORDER — HALOPERIDOL 5 MG PO TABS
5.0000 mg | ORAL_TABLET | Freq: Four times a day (QID) | ORAL | Status: DC | PRN
  Administered 2018-04-24: 5 mg via ORAL
  Filled 2018-04-24 (×2): qty 1

## 2018-04-24 NOTE — ED Notes (Addendum)
Called telenuerology disptach [phone]). Neuro has not came through the tele cart yet. Told by dispatch it will be in the next 5 min.

## 2018-04-24 NOTE — Progress Notes (Addendum)
10:49am-CSW spoke with RN and was informed that more test are going to be ran on pt at this time. CSW will continue to follow for further needs.   CSW spoke with RN regarding consult. RN expressed that she did received call from pt's DSS worker who expressed that they would be by to see pt today. CSW was informed that RN has spoken with MD who is planning to speak with family. RN expressed that Ethanol test would be placed to assess further inability to walk. RN to call CSW back once MD has spoken with family and pt.    SCW made aware from previous visit to the ED that pt is not wanting to be placed. CSW will follow for further needs.       Anne Black, MSW, LCSW-A Emergency Department Clinical Social Worker [phone]

## 2018-04-24 NOTE — ED Notes (Signed)
Verbal order for EKG from [place], MD

## 2018-04-24 NOTE — ED Notes (Addendum)
Spoke to [place], MD and told to continue with telenuerology if they come through on tele cart and patient can go upstairs when bed is ready.

## 2018-04-24 NOTE — ED Notes (Signed)
US at bedside

## 2018-04-24 NOTE — ED Notes (Signed)
Ultrasound at bedside

## 2018-04-24 NOTE — ED Notes (Signed)
Portable DG at bedside. Telle neuro at bedside.

## 2018-04-24 NOTE — ED Notes (Addendum)
Patient called out to have bowel movement and insisted she could walk to bathroom. Patient ambulated to bathroom with a heavy 2 assist and on 02. Family (daugher and former spouse) showed up while patient was in bathroom. Rhunette Croft, MD made aware of patients ambulatory status and family wanting update. Spoke with social work and made social work aware of situation and patient needing maximum assistance to ambulate to restroom. Orders to collect ethanol and will update social work after.

## 2018-04-24 NOTE — ED Notes (Signed)
At this time their has been no call in from neurology on cart 1. Will continue to monitor.

## 2018-04-24 NOTE — Consult Note (Signed)
[facility] TeleNeurology Consult Services  Impression:  MS, alcohol use, balance abnormality Deconditioning, unclear if MS exacerbation as pt unable to give history baseline unclear  Recommendations:  Admit for physical therapy eval/OT/ST, placement - case worker History unclear as pt unable to give - if pt w focal motor/sensory findings or history of abrupt change in symptoms - can proceed w MRI BR/C spine w/wout for MS exacerbation eval will d/w ER MD ---------------------------------------------------------------------  CC:  History of Present Illness:  58 yo F h/o MS, uses walker at baseline, A fib, COPD, GERD, p/w difficulty walking, falling unclear length of time, per RN staff for 1 year.  PT unable to give history, somnolent.  Lives at home alone, found frequently lying in her feces and urine.  Pt received haldol in the ER.    Diagnostic Testing: Ct head / CT C spine - unremarkable  Vital Signs:   110/58  Exam: difficulty following commands Mental Status:  somnolent, not alert, oriented to name, not to year of birth Speech: fluent  Cranial Nerves:  Extraocular movements: pt unable to follow commands to test eye movements Ptosis: Absent Facial movements: Intact and symmetric    Motor Exam:  No drift upper ext, generalized weakness bilat lower ext   Tremor/Abnormal Movements:  Postural tremor: present  Coordination:   Finger to nose: Intact   Medical Decision Making:  - Extensive number of diagnosis or management options are considered above.   - Extensive amount of complex data reviewed.   - High risk of complication and/or morbidity or mortality are associated with differential diagnostic considerations above.  - There may be uncertain outcome and increased probability of prolonged functional impairment or high probability of severe prolonged functional impairment associated with some of these differential diagnosis.   Medical Data Reviewed:  1.Data  reviewed include clinical labs, radiology,  Medical Tests;   2.Tests results discussed w/performing or interpreting physician;   3.Obtaining/reviewing old medical records;  4.Obtaining case history from another source;  5.Independent review of image, tracing or specimen.    Patient was informed the Neurology Consult would happen via telehealth (remote video) and consented to receiving care in this manner.

## 2018-04-24 NOTE — ED Notes (Addendum)
Tele Neurology have still not came through on screen. Will attempt to call back. Patient not following commands and wanting to continue sleeping. Rito Ehrlich, MD paged and notified.

## 2018-04-24 NOTE — ED Notes (Signed)
Patient sleeping. Call light at bedside. Per night shift nurse let patient sleep and ambulate before leaving. Waiting for social worker at this time.

## 2018-04-24 NOTE — ED Triage Notes (Signed)
Pt coming to the ED post fall with complaints of neck and back pain.

## 2018-04-24 NOTE — ED Notes (Signed)
This NT and RN attempted to ambulate pt, but pt not alert enough to sit up on edge of bed to ambulate. Will re-attempt when pt is more alert.

## 2018-04-24 NOTE — ED Notes (Signed)
Update social worker once lab work is back and feedback from MD.   [phone]

## 2018-04-24 NOTE — ED Notes (Signed)
Rito Ehrlich, MD at bedside and aware of patients pulse-140.

## 2018-04-24 NOTE — ED Notes (Signed)
Paged Shelly Bombard, MD about admission orders being placed. None seen at this time.

## 2018-04-24 NOTE — ED Notes (Signed)
Bed: WA20 Expected date:  Expected time:  Means of arrival:  Comments: EMS 58 yo female fall neck and back pain/c-collar

## 2018-04-24 NOTE — ED Notes (Signed)
Patient given a meal tray.

## 2018-04-24 NOTE — ED Notes (Addendum)
Anne Croft, MD at bedside speaking with patient and family.

## 2018-04-24 NOTE — ED Notes (Signed)
Attempted to walk patient. Patient states she does not walk, lives at home alone, and stays in the bed all day. Encouraged patient to sit on side of bed. Patient refused.

## 2018-04-24 NOTE — ED Notes (Signed)
Patient called out for pain "Everywhere and headache". Rhunette Croft, MD made aware.

## 2018-04-24 NOTE — ED Notes (Signed)
Spoke with Chales Abrahams, MD neuro on tele cart. Attempted neuro exam but unable to get full assessment. Neurologist would like to talk to MD. This RN paged Rito Ehrlich, MD with phone number to contact neuro.   [phone]

## 2018-04-24 NOTE — ED Notes (Signed)
Pharmacy at bedside. Waiting for medication to be verified. 2 medications given that are verified.

## 2018-04-24 NOTE — ED Notes (Signed)
Pt would not stay awake to ambulate. RN and NT turned on lights and constantly tried to motivate pt to stand up. Pt tried to lift up out of bed a collapsed back in bed. Pt was dead weight.

## 2018-04-24 NOTE — Progress Notes (Signed)
CSW received call Scott [phone] who is with EMS. CSW was advised that EMS and the [facility] department has been called to pt's house over 70 times within the last 90 days Scott reported that he is aware that APS Darryl is involved in pt's case but has no further leads. CSW was advised by Lorin Picket that pt will not allow anyone to be her legal guardian. Per Lorin Picket pt's home is unsanitary to live in and he understands that pt has been refusing all services offered to her in the past. Lorin Picket sought details on whether pt would be being admitted however CSW advised him that at this time CSW is not sure but would also not be able to determined if pt would be admitted or not. Scott understanding and expressed that staff can call him if more information is needed from [facility] point of view.      Anne Black, MSW, LCSW-A Emergency Department Clinical Social Worker [phone]

## 2018-04-24 NOTE — ED Notes (Signed)
Patient given meal tray.

## 2018-04-24 NOTE — ED Provider Notes (Signed)
[facility]-EMERGENCY DEPT Provider Note  CSN: [id number] Arrival date & time: 04/24/18 4158  Chief Complaint(s) Fall  HPI Anne Black is a 58 y.o. female with a history of alcohol abuse and COPD on [facility] at home who presents to the emergency department after a fall while intoxicated several hours prior to arrival.  Patient states the last time that she remembers was around 2300.  She reports that she was finally able to crawl to the phone to call 911. States that she drinks a bottle of wine a day.  Complaining of mild to moderate neck and back aching pain.  Denies any other physical complaints.  Denies any recent fevers or infections.  No current nausea or vomiting.  No urinary symptoms.  HPI  Past Medical History Past Medical History:  Diagnosis Date   ADHD (attention deficit hyperactivity disorder)    Alcoholic cirrhosis (HCC)    Alcoholism (HCC)    Allergies    Anxiety    Arthritis    Ascites    Asthma    Atrial fibrillation with RVR (HCC)    Bipolar disorder (HCC)    COPD (chronic obstructive pulmonary disease) (HCC)    Depression    Dyspnea    Dysrhythmia    GERD (gastroesophageal reflux disease)    History of hiatal hernia    Hypokalemia    Migraine    Multiple sclerosis (HCC)    Narcolepsy    Osteoarthritis of knee    Osteoporosis    Pneumonia    Patient Active Problem List   Diagnosis Date Noted   Respiratory failure with hypoxia (HCC) 04/18/2018   Chronic hypoxemic respiratory failure (HCC) 04/11/2018   Atrial fibrillation with rapid ventricular response (HCC) 03/23/2018   Head injury    Fall 03/19/2018   Syncope 03/19/2018   Orbital fracture 03/19/2018   Scalp laceration 03/19/2018   Acute respiratory failure with hypoxia and hypercarbia (HCC) 03/19/2018   Abdominal tenderness 03/19/2018   Acute on chronic respiratory failure with hypoxia (HCC) 03/17/2018   Alcohol abuse with intoxication (HCC)  03/17/2018   ETOH abuse    Normocytic anemia 03/16/2018   Acute metabolic encephalopathy 03/16/2018   Lower leg edema 03/16/2018   Migraine 03/16/2018   A-fib (HCC) 02/18/2018   Community acquired pneumonia of left lower lobe of lung (HCC)    Weakness 08/21/2017   COPD exacerbation (HCC) 06/12/2017   Chest pain with moderate risk for cardiac etiology 05/30/2017   Abnormal LFTs 05/30/2017   Tachycardia 05/30/2017   COPD (chronic obstructive pulmonary disease) (HCC) 04/19/2017   Right rib fracture 04/15/2017   Abdominal wall cellulitis 12/30/2016   S/P exploratory laparotomy 12/30/2016   Distal radius fracture, left 09/09/2016   Thrombocytopenia (HCC) 09/09/2016   Chest x-ray abnormality    Umbilical hernia without obstruction and without gangrene 05/12/2016   Itching 02/24/2016   Ventral hernia without obstruction or gangrene 02/24/2016   Palliative care encounter    Ascites    Tachypnea    Atrial flutter with rapid ventricular response (HCC)    Hypomagnesemia    Hypoxia    Dehydration 01/22/2016   Low back ache 12/30/2015   Non-intractable vomiting with nausea    Alcohol abuse with alcohol-induced mood disorder (HCC) 12/15/2015   Acute alcoholism (HCC)    Abdominal distension 11/04/2015   Alcohol abuse    Dyspnea    Acute respiratory failure (HCC) 10/15/2015   Ascites due to alcoholic cirrhosis (HCC) 10/15/2015   Osteoporosis  Multiple sclerosis (HCC)    Alcoholic cirrhosis of liver with ascites (HCC) 08/22/2015   Bipolar disorder, current episode mixed, moderate (HCC) 08/01/2015   Alcohol use disorder, severe, dependence (HCC) 07/31/2015   Macrocytic anemia- due to alcohol abuse with normal B12 & folate levels 05/31/2015   Severe protein-calorie malnutrition (HCC) 05/31/2015   Hypokalemia 05/26/2015   Encephalopathy, hepatic (HCC) 05/26/2015   Alcohol withdrawal (HCC) 05/18/2015   Abdominal pain 05/18/2015    Anxiety 05/18/2015   Severe sepsis (HCC) 05/18/2015   UTI (lower urinary tract infection) 05/18/2015   Stimulant abuse (HCC) 12/27/2014   Nicotine abuse 12/27/2014   Hyperprolactinemia (HCC) 11/15/2014   Dyslipidemia    Alcohol use disorder, moderate, dependence (HCC) 10/30/2014   Tobacco abuse 01/23/2014   Bipolar 1 disorder (HCC) 04/07/2013   Noncompliance with therapeutic plan 04/04/2013   Hereditary and idiopathic peripheral neuropathy 05/01/2012   Depression 05/01/2012   GERD (gastroesophageal reflux disease) 05/01/2012   Osteoarthrosis, unspecified whether generalized or localized, involving lower leg 05/01/2012   Hyponatremia 07/01/2011   Home Medication(s) Prior to Admission medications   Medication Sig Start Date End Date Taking? Authorizing Provider  acetaminophen (TYLENOL) 500 MG tablet Take 1 tablet (500 mg total) by mouth every 6 (six) hours as needed for mild pain. Patient taking differently: Take 1,000 mg by mouth every 6 (six) hours as needed for mild pain.  03/28/18   Rolly Salter, MD  albuterol (PROVENTIL HFA;VENTOLIN HFA) 108 (90 Base) MCG/ACT inhaler Inhale 2 puffs into the lungs every 4 (four) hours as needed for wheezing or shortness of breath.    [provider]  aluminum-magnesium hydroxide-simethicone (MAALOX) 200-200-20 MG/5ML SUSP Take 30 mLs by mouth 4 (four) times daily as needed (indigestion).     [provider]  chlordiazePOXIDE (LIBRIUM) 25 MG capsule  PO TID x 1D, then 25-50mg  PO BID X 1D, then 25-50mg  PO QD X 1D Patient taking differently: Take 25-50 mg by mouth daily as needed for anxiety or withdrawal.  04/17/18   McDonald, Mia A, PA-C  diltiazem (CARDIZEM CD) 120 MG 24 hr capsule Take 1 capsule (120 mg total) by mouth daily for 30 days. 04/14/18 05/14/18  Tegeler, Canary Brim, MD  FLUoxetine (PROZAC) 20 MG capsule Take 1 capsule (20 mg total) by mouth daily for 30 doses. 04/14/18 05/14/18  Tegeler, Canary Brim, MD    Fluticasone-Salmeterol [facility] INHUB) 250-50 MCG/DOSE AEPB Inhale 2 puffs into the lungs 2 (two) times daily.     [provider]  folic acid (FOLVITE) 1 MG tablet Take 1 tablet (1 mg total) by mouth daily for 30 days. Patient not taking: Reported on 04/19/2018 04/12/18 05/12/18  Dimple Nanas, MD  furosemide (LASIX) 40 MG tablet Take 40 mg twice daily till 04/02/2018, after that take 40 mg daily. Patient taking differently: Take 40 mg by mouth daily. Take 40 mg twice daily till 04/02/2018, after that take 40 mg daily. 04/14/18   Tegeler, Canary Brim, MD  gabapentin (NEURONTIN) 100 MG capsule Take 2 capsules (200 mg total) by mouth 3 (three) times daily for 30 days. 04/14/18 05/14/18  Tegeler, Canary Brim, MD  hydrOXYzine (ATARAX/VISTARIL) 25 MG tablet Take 1 tablet (25 mg total) by mouth daily as needed for up to 30 doses (anxiety). Patient taking differently: Take 25 mg by mouth daily as needed for anxiety (withdrawal).  04/14/18   Tegeler, Canary Brim, MD  ipratropium-albuterol (DUONEB) 0.5-2.5 (3) MG/3ML SOLN Take 3 mLs by nebulization 2 (two) times daily. AND  every 4 hours as needed for wheezing/shortness of breath    [provider]  lactulose (CHRONULAC) 10 GM/15ML solution Take 45 mLs (30 g total) by mouth 3 (three) times daily for 30 days. Patient not taking: Reported on 04/19/2018 04/14/18 05/14/18  Tegeler, Canary Brim, MD  lidocaine (LIDODERM) 5 % Place 1 patch onto the skin daily. Remove & Discard patch within 12 hours or as directed by MD 04/09/18   Nicanor Alcon, April, MD  magnesium oxide (MAG-OX) 400 MG tablet Take 400 mg by mouth daily.    [provider]  ondansetron (ZOFRAN-ODT) 4 MG disintegrating tablet Take 1 tablet (4 mg total) by mouth every 8 (eight) hours as needed for nausea or vomiting. 04/14/18   Tegeler, Canary Brim, MD  pantoprazole (PROTONIX) 40 MG tablet Take 1 tablet (40 mg total) by mouth daily for 30 days. 04/14/18 05/14/18  Tegeler, Canary Brim, MD   potassium chloride SA (K-DUR,KLOR-CON) 20 MEQ tablet Take 2 tablets (40 mEq total) by mouth 2 (two) times daily. 04/19/18   Bethel Born, PA-C  predniSONE (DELTASONE) 20 MG tablet 3 tabs po day one, then 2 po daily x 4 days Patient not taking: Reported on 04/19/2018 04/09/18   Palumbo, April, MD  rizatriptan (MAXALT) 5 MG tablet Take 5 mg by mouth as needed for migraine. May repeat in 2 hours if needed; not to exceed  in 24 hours    [provider]  spironolactone (ALDACTONE) 25 MG tablet Take 1 tablet (25 mg total) by mouth daily for 30 days. 04/14/18 05/14/18  Tegeler, Canary Brim, MD  thiamine 100 MG tablet Take 1 tablet (100 mg total) by mouth daily for 30 days. Patient not taking: Reported on 04/19/2018 04/14/18 05/14/18  Tegeler, Canary Brim, MD                                                                                                                                    Past Surgical History Past Surgical History:  Procedure Laterality Date   ABDOMINAL WALL DEFECT REPAIR N/A 12/30/2016   Procedure: EXPLORATORY LAPAROTOMY WITH REPAIR ABDOMINAL WALL VENTRAL HERNIA;  Surgeon: [name], MD;  Location: [facility] OR;  Service: General;  Laterality: N/A;   APPLICATION OF WOUND VAC N/A 12/30/2016   Procedure: APPLICATION OF WOUND VAC;  Surgeon: [name], MD;  Location: [facility] OR;  Service: General;  Laterality: N/A;   CESAREAN SECTION  [phone]   FRACTURE SURGERY     HERNIA REPAIR     MYRINGOTOMY WITH TUBE PLACEMENT Bilateral    ORIF WRIST FRACTURE Left 09/09/2016   Procedure: OPEN REDUCTION INTERNAL FIXATION (ORIF) LEFT WRIST FRACTURE, LEFT CARPAL TUNNEL RELEASE;  Surgeon: Dominica Severin, MD;  Location: [facility] ORS;  Service: Orthopedics;  Laterality: Left;   TONSILLECTOMY     Family History Family History  Problem Relation Age of Onset   Arrhythmia Mother    Neuropathy Mother  Coronary artery disease Mother    Heart disease Mother    Heart disease  Father    Hypertension Father    Heart attack Father    Multiple sclerosis Maternal Aunt     Social History Social History   Tobacco Use   Smoking status: Current Every Day Smoker    Packs/day: 1.00    Years: 35.00    Pack years: 35.00    Types: Cigarettes   Smokeless tobacco: Never Used   Tobacco comment: Pt states smoked this morning  Substance Use Topics   Alcohol use: Yes    Comment: 1 bottle of wine per day per patient.    Drug use: No   Allergies Patient has no known allergies.  Review of Systems Review of Systems All other systems are reviewed and are negative for acute change except as noted in the HPI  Physical Exam Vital Signs  I have reviewed the triage vital signs BP 121/63    Pulse 89    Temp 97.9 F (36.6 C) (Oral)    Resp 13    Ht 5\' 3"  (1.6 m)    Wt 83.3 kg    SpO2 98%    BMI 32.53 kg/m   Physical Exam Constitutional:      General: She is not in acute distress.    Appearance: She is well-developed. She is not diaphoretic.  HENT:     Head: Normocephalic and atraumatic.      Right Ear: External ear normal.     Left Ear: External ear normal.     Nose: Nose normal.     Mouth/Throat:     Dentition: No dental tenderness.  Eyes:     General: No scleral icterus.       Right eye: No discharge.        Left eye: No discharge.     Conjunctiva/sclera: Conjunctivae normal.     Pupils: Pupils are equal, round, and reactive to light.  Neck:     Musculoskeletal: Normal range of motion and neck supple.  Cardiovascular:     Rate and Rhythm: Normal rate and regular rhythm.     Pulses:          Radial pulses are 2+ on the right side and 2+ on the left side.       Dorsalis pedis pulses are 2+ on the right side and 2+ on the left side.     Heart sounds: Normal heart sounds. No murmur. No friction rub. No gallop.   Pulmonary:     Effort: Pulmonary effort is normal. No respiratory distress.     Breath sounds: Normal breath sounds. No stridor. No  wheezing.  Abdominal:     General: There is no distension.     Palpations: Abdomen is soft.     Tenderness: There is no abdominal tenderness.    Musculoskeletal:     Cervical back: She exhibits tenderness. She exhibits no bony tenderness.     Thoracic back: She exhibits tenderness. She exhibits no bony tenderness.     Lumbar back: She exhibits no bony tenderness.       Back:     Comments: Clavicles stable. Chest stable to AP/Lat compression. Pelvis stable to Lat compression. No obvious extremity deformity. No chest or abdominal wall contusion.  BLE 2+ edema  Skin:    General: Skin is warm and dry.     Findings: No erythema or rash.  Neurological:     Mental Status: She is alert  and oriented to person, place, and time.     Comments: Clinically intoxicated. Follows commands. Moving all extremities with 5/5 strength through and symmetric.     ED Results and Treatments Labs (all labs ordered are listed, but only abnormal results are displayed) Labs Reviewed  CBC WITH DIFFERENTIAL/PLATELET - Abnormal; Notable for the following components:      Result Value   Hemoglobin 9.4 (*)    HCT 33.9 (*)    MCH 22.9 (*)    MCHC 27.7 (*)    RDW 19.8 (*)    Platelets 93 (*)    All other components within normal limits  COMPREHENSIVE METABOLIC PANEL - Abnormal; Notable for the following components:   Chloride 93 (*)    CO2 33 (*)    Glucose, Bld 106 (*)    BUN 5 (*)    Calcium 8.0 (*)    Total Protein 6.4 (*)    Albumin 3.3 (*)    Total Bilirubin 1.8 (*)    All other components within normal limits  LIPASE, BLOOD  CK                                                                                                                         EKG  EKG Interpretation  Date/Time:  Monday April 24 2018 04:32:53 EDT Ventricular Rate:  94 PR Interval:    QRS Duration: 94 QT Interval:  370 QTC Calculation: 463 R Axis:   85 Text Interpretation:  Sinus rhythm No significant change since  last tracing Confirmed by Drema Pry [phone]) on 04/24/2018 4:42:48 AM      Radiology Ct Head Wo Contrast  Result Date: 04/24/2018 CLINICAL DATA:  Head trauma. Neck and back pain. EXAM: CT HEAD WITHOUT CONTRAST CT CERVICAL SPINE WITHOUT CONTRAST TECHNIQUE: Multidetector CT imaging of the head and cervical spine was performed following the standard protocol without intravenous contrast. Multiplanar CT image reconstructions of the cervical spine were also generated. COMPARISON:  Head CT 04/18/2018 FINDINGS: CT HEAD FINDINGS Brain: There is no mass, hemorrhage or extra-axial collection. The size and configuration of the ventricles and extra-axial CSF spaces are normal. There is hypoattenuation of the periventricular white matter, most commonly indicating chronic ischemic microangiopathy. Vascular: No abnormal hyperdensity of the major intracranial arteries or dural venous sinuses. No intracranial atherosclerosis. Skull: The visualized skull base, calvarium and extracranial soft tissues are normal. Sinuses/Orbits: No fluid levels or advanced mucosal thickening of the visualized paranasal sinuses. No mastoid or middle ear effusion. The orbits are normal. CT CERVICAL SPINE FINDINGS Alignment: No static subluxation. Facets are aligned. Occipital condyles are normally positioned. Skull base and vertebrae: No acute fracture. Soft tissues and spinal canal: No prevertebral fluid or swelling. No visible canal hematoma. Disc levels: No advanced spinal canal or neural foraminal stenosis. Upper chest: No pneumothorax, pulmonary nodule or pleural effusion. Other: Normal visualized paraspinal cervical soft tissues. IMPRESSION: 1. No acute intracranial abnormality. 2. No acute fracture or static subluxation of the cervical  spine. Electronically Signed   By: Deatra RobinsonKevin  Herman M.D.   On: 04/24/2018 05:23   Ct Cervical Spine Wo Contrast  Result Date: 04/24/2018 CLINICAL DATA:  Head trauma. Neck and back pain. EXAM: CT HEAD  WITHOUT CONTRAST CT CERVICAL SPINE WITHOUT CONTRAST TECHNIQUE: Multidetector CT imaging of the head and cervical spine was performed following the standard protocol without intravenous contrast. Multiplanar CT image reconstructions of the cervical spine were also generated. COMPARISON:  Head CT 04/18/2018 FINDINGS: CT HEAD FINDINGS Brain: There is no mass, hemorrhage or extra-axial collection. The size and configuration of the ventricles and extra-axial CSF spaces are normal. There is hypoattenuation of the periventricular white matter, most commonly indicating chronic ischemic microangiopathy. Vascular: No abnormal hyperdensity of the major intracranial arteries or dural venous sinuses. No intracranial atherosclerosis. Skull: The visualized skull base, calvarium and extracranial soft tissues are normal. Sinuses/Orbits: No fluid levels or advanced mucosal thickening of the visualized paranasal sinuses. No mastoid or middle ear effusion. The orbits are normal. CT CERVICAL SPINE FINDINGS Alignment: No static subluxation. Facets are aligned. Occipital condyles are normally positioned. Skull base and vertebrae: No acute fracture. Soft tissues and spinal canal: No prevertebral fluid or swelling. No visible canal hematoma. Disc levels: No advanced spinal canal or neural foraminal stenosis. Upper chest: No pneumothorax, pulmonary nodule or pleural effusion. Other: Normal visualized paraspinal cervical soft tissues. IMPRESSION: 1. No acute intracranial abnormality. 2. No acute fracture or static subluxation of the cervical spine. Electronically Signed   By: Deatra RobinsonKevin  Herman M.D.   On: 04/24/2018 05:23   Pertinent labs & imaging results that were available during my care of the patient were reviewed by me and considered in my medical decision making (see chart for details).  Medications Ordered in ED Medications  sodium chloride 0.9 % bolus 1,000 mL (0 mLs Intravenous Stopped 04/24/18 0704)                                                                                                                                     Procedures Procedures  (including critical care time)  Medical Decision Making / ED Course I have reviewed the nursing notes for this encounter and the patient's prior records (if available in EHR or on provided paperwork).    Alcohol intoxication resulting in fall complaining of neck and back pain - appears to be muscular in nature. Given EtOH will get CT head and cervical spine. Screening labs to assess for electrolyte derangement as well as rhabdo given her reported down time.  CTs w/o acute injuries. Labs at patient's baseline and reassuring w/o hyponatremia, renal insufficiency, or rhabdo.  Attempted to ambulate, but patient still too intoxicated. Will allow her to MTF     Final Clinical Impression(s) / ED Diagnoses Final diagnoses:  Alcoholic intoxication without complication (HCC)  Fall, initial encounter  Muscle strain      This chart was dictated using voice recognition software.  Despite best efforts to proofread,  errors can occur which can change the documentation meaning.   Nira Conn, MD 04/24/18 [phone]

## 2018-04-24 NOTE — ED Notes (Signed)
Spoke to DSS. They were wanting to know if patient could have a visitor. Explained to DSS that we are only allowing one visitor per patient and that if the patient was ok with a visitor then she could come.

## 2018-04-24 NOTE — ED Notes (Addendum)
Patient feeding herself a sandwich with no assistance. Call light at bedside, patient on 02 @ 2 litters, and new pure wick placed.

## 2018-04-24 NOTE — ED Provider Notes (Signed)
  Physical Exam  BP (!) 117/56   Pulse 90   Temp 98.2 F (36.8 C)   Resp 18   Ht 5\' 3"  (1.6 m)   Wt 83.3 kg   SpO2 99%   BMI 32.53 kg/m   Physical Exam  ED Course/Procedures     Procedures  MDM  Assuming care of patient from Dr. Eudelia Bunch.   Patient in the ED for fall.  DSS was also consulted because per family and EMS patient lives by herself in an inhabitable housing situation. Workup thus far shows no acute findings.  CT head and cervical spine are normal.  Concerning findings are as following : none Important pending results are : none  According to Dr. Eudelia Bunch, plan is to reassess the patient when she is clinically sober and to follow-up with social work recommendations.  Patient had no complains, no concerns from the nursing side. Will continue to monitor.  12:18 PM Pt was assessed by me this morning.  She appeared clinically sober.  When we try to walk her, patient was limp and ataxic.  She reports that she has had balance issues and uses a walker.  Typically her balance issues have been because of a combination of her MS and alcohol use.  Over the last 1 to 2 weeks however she has had increased falls and they have been because of more balance issues.  Patient unfortunately has continued to drink heavily over the same duration  -so her symptoms could be just because of her alcohol use, but she is able to clearly tell us that her symptoms have progressed the last few days.  Patient's neurologic exam was normal.  She did not have any dysmetria and nystagmus.  Lower extremity strength is 4+ out of 5. Differential right now would include MS flare, deconditioning and weakness due to alcohol abuse, Wernicke's disease, chronic damage to CNS due to her alcohol abuse. She has history of COPD and is on chronic oxygen, therefore hypoxia could also be the cause for her decompensation.  We will consult telemetry neurology.  I have also spoken with family who are at the bedside, and it  is abundantly clear that patient is not safe to go home.   Plan is to have telemetry neurology see the patient. Given that she is likely to need further neurologic evaluation, PT evaluation and placement -we will call medicine for admission. I have ordered ultrasound DVT because patient is noted to have unilateral swelling in the right lower extremity.   Derwood Kaplan, MD 04/24/18 1230

## 2018-04-24 NOTE — Progress Notes (Addendum)
Pt got out of bed unassisted and told primary nurse that she wanted to leave AMA. The writer Psychologist, occupational) came to speak with the patient who was adamant on leaving the hospital stating, "Get me paper scrubs! Give me my phone, I'm calling a cab! I am leaving". Consulting civil engineer then gathered paper work for patient to leave AMA and assisted pt to ED waiting room per request.

## 2018-04-24 NOTE — H&P (Signed)
History and Physical  MARIEM SKOLNICK ZOX:[id number] DOB: July 26, 1960 DOA: 04/24/2018  Referring physician: Derwood Kaplan, ER Physician  PCP: Patient, No Pcp Per  Outpatient Specialists: None Patient coming from: Home & is able to ambulate with use of a walker  Chief Complaint: Unable to walk  HPI: Anne Black is a 58 y.o. female with medical history significant for recurrent alcohol use and frequent hospitalizations and ER visits plus history of MS who presented after a fall.  EMS found patient in an inhabitable hospital and situation.  Patient was also acutely alcohol intoxicated.  She was brought in and CT scan of head and spine were unremarkable.  However, when attempts to walk the patient she was found to be limp and unable to stand or walk.  Her balance issues have been in the past felt to be combination of her MS plus alcohol use but lately appears to have been worsened.  Tele-neurology consulted and hospitalist were called for further evaluation.  Patient required Haldol for agitation.  She was seen by tele-neurology, although she was somewhat somnolent because of her Haldol.  Tele-neurology could find no focal findings during her exam.   Review of Systems: Patient seen in the emergency room.  She was initially quite somnolent.  Several hours later, she awoken and felt very anxious asking for a cigarette or nicotine patch. Pt unable to give me a review of systems due to her somnolence   Past Medical History:  Diagnosis Date   ADHD (attention deficit hyperactivity disorder)    Alcoholic cirrhosis (HCC)    Alcoholism (HCC)    Allergies    Anxiety    Arthritis    Ascites    Asthma    Atrial fibrillation with RVR (HCC)    Bipolar disorder (HCC)    COPD (chronic obstructive pulmonary disease) (HCC)    Depression    Dyspnea    Dysrhythmia    GERD (gastroesophageal reflux disease)    History of hiatal hernia    Hypokalemia    Migraine    Multiple sclerosis  (HCC)    Narcolepsy    Osteoarthritis of knee    Osteoporosis    Pneumonia    Past Surgical History:  Procedure Laterality Date   ABDOMINAL WALL DEFECT REPAIR N/A 12/30/2016   Procedure: EXPLORATORY LAPAROTOMY WITH REPAIR ABDOMINAL WALL VENTRAL HERNIA;  Surgeon: [name], MD;  Location: [facility] OR;  Service: General;  Laterality: N/A;   APPLICATION OF WOUND VAC N/A 12/30/2016   Procedure: APPLICATION OF WOUND VAC;  Surgeon: [name], MD;  Location: [facility] OR;  Service: General;  Laterality: N/A;   CESAREAN SECTION  [phone]   FRACTURE SURGERY     HERNIA REPAIR     MYRINGOTOMY WITH TUBE PLACEMENT Bilateral    ORIF WRIST FRACTURE Left 09/09/2016   Procedure: OPEN REDUCTION INTERNAL FIXATION (ORIF) LEFT WRIST FRACTURE, LEFT CARPAL TUNNEL RELEASE;  Surgeon: Dominica Severin, MD;  Location: [facility] ORS;  Service: Orthopedics;  Laterality: Left;   TONSILLECTOMY      Social History:  reports that she has been smoking cigarettes. She has a 35.00 pack-year smoking history. She has never used smokeless tobacco. She reports current alcohol use. She reports that she does not use drugs. Heavy alcohol abuser, lives at home alone.  Ambulates with walker  No Known Allergies  Family History  Problem Relation Age of Onset   Arrhythmia Mother    Neuropathy Mother    Coronary artery disease Mother    Heart  disease Mother    Heart disease Father    Hypertension Father    Heart attack Father    Multiple sclerosis Maternal Aunt       Prior to Admission medications   Medication Sig Start Date End Date Taking? Authorizing Provider  acetaminophen (TYLENOL) 500 MG tablet Take 1 tablet (500 mg total) by mouth every 6 (six) hours as needed for mild pain. Patient taking differently: Take 1,000 mg by mouth every 6 (six) hours as needed for mild pain.  03/28/18  Yes Rolly Salter, MD  albuterol (PROVENTIL HFA;VENTOLIN HFA) 108 (90 Base) MCG/ACT inhaler Inhale 2 puffs into the  lungs every 4 (four) hours as needed for wheezing or shortness of breath.   Yes [provider]  aluminum-magnesium hydroxide-simethicone (MAALOX) 200-200-20 MG/5ML SUSP Take 30 mLs by mouth 4 (four) times daily as needed (indigestion).    Yes [provider]  chlordiazePOXIDE (LIBRIUM) 25 MG capsule 50mg  PO TID x 1D, then 25-50mg  PO BID X 1D, then 25-50mg  PO QD X 1D Patient taking differently: Take 25-50 mg by mouth daily as needed for anxiety or withdrawal.  04/17/18  Yes McDonald, Mia A, PA-C  diltiazem (CARDIZEM CD) 120 MG 24 hr capsule Take 1 capsule (120 mg total) by mouth daily for 30 days. 04/14/18 05/14/18 Yes Tegeler, Canary Brim, MD  FLUoxetine (PROZAC) 20 MG capsule Take 1 capsule (20 mg total) by mouth daily for 30 doses. 04/14/18 05/14/18 Yes Tegeler, Canary Brim, MD  furosemide (LASIX) 40 MG tablet Take 40 mg twice daily till 04/02/2018, after that take 40 mg daily. Patient taking differently: Take 40 mg by mouth daily. Take 40 mg twice daily till 04/02/2018, after that take 40 mg daily. 04/14/18  Yes Tegeler, Canary Brim, MD  gabapentin (NEURONTIN) 100 MG capsule Take 2 capsules (200 mg total) by mouth 3 (three) times daily for 30 days. 04/14/18 05/14/18 Yes Tegeler, Canary Brim, MD  Fluticasone-Salmeterol [facility] INHUB) 250-50 MCG/DOSE AEPB Inhale 2 puffs into the lungs 2 (two) times daily.     [provider]  hydrOXYzine (ATARAX/VISTARIL) 25 MG tablet Take 1 tablet (25 mg total) by mouth daily as needed for up to 30 doses (anxiety). Patient taking differently: Take 25 mg by mouth daily as needed for anxiety (withdrawal).  04/14/18   Tegeler, Canary Brim, MD  ipratropium-albuterol (DUONEB) 0.5-2.5 (3) MG/3ML SOLN Take 3 mLs by nebulization 2 (two) times daily. AND every 4 hours as needed for wheezing/shortness of breath    [provider]  lactulose (CHRONULAC) 10 GM/15ML solution Take 45 mLs (30 g total) by mouth 3 (three) times daily for 30 days. Patient  not taking: Reported on 04/19/2018 04/14/18 05/14/18  Tegeler, Canary Brim, MD  lidocaine (LIDODERM) 5 % Place 1 patch onto the skin daily. Remove & Discard patch within 12 hours or as directed by MD 04/09/18   Nicanor Alcon, April, MD  magnesium oxide (MAG-OX) 400 MG tablet Take 400 mg by mouth daily.    [provider]  ondansetron (ZOFRAN-ODT) 4 MG disintegrating tablet Take 1 tablet (4 mg total) by mouth every 8 (eight) hours as needed for nausea or vomiting. 04/14/18   Tegeler, Canary Brim, MD  pantoprazole (PROTONIX) 40 MG tablet Take 1 tablet (40 mg total) by mouth daily for 30 days. 04/14/18 05/14/18  Tegeler, Canary Brim, MD  potassium chloride SA (K-DUR,KLOR-CON) 20 MEQ tablet Take 2 tablets (40 mEq total) by mouth 2 (two) times daily. 04/19/18   Bethel Born, PA-C  rizatriptan (MAXALT) 5 MG tablet Take 5 mg by mouth as needed for migraine. May repeat in 2 hours if needed; not to exceed 30mg  in 24 hours    [provider]  spironolactone (ALDACTONE) 25 MG tablet Take 1 tablet (25 mg total) by mouth daily for 30 days. 04/14/18 05/14/18  Tegeler, Canary Brim, MD    Physical Exam: BP 120/62 (BP Location: Left Arm)    Pulse 92    Temp 98.7 F (37.1 C) (Oral)    Resp 16    Ht 5\' 3"  (1.6 m)    Wt 83.3 kg    SpO2 100%    BMI 32.53 kg/m   General: Somnolent, hard to awaken Eyes: Sclera appear nonicteric, extraocular movements appear to be intact ENT: Normocephalic and atraumatic, mucous membranes are slightly dry Neck: Supple, no JVD Cardiovascular: Regular rate and rhythm, S1-S2 Respiratory: Clear to auscultation bilaterally Abdomen: Soft, nontender, nondistended, positive bowel sounds Skin: No skin breaks, tears or lesions Musculoskeletal: No clubbing or cyanosis, trace pitting edema Psychiatric: Currently somnolent Neurologic:, Her somnolence makes it difficult to assess her neurological status          Labs on Admission:  Basic Metabolic Panel: Recent Labs  Lab  04/18/18 1247 04/18/18 1248 04/19/18 0813 04/24/18 0439  NA 138  --  138 136  K 2.4*  --  2.5* 3.6  CL 91*  --  89* 93*  CO2 37*  --  37* 33*  GLUCOSE 112*  --  98 106*  BUN <5*  --  <5* 5*  CREATININE 0.45  --  0.42* 0.44  CALCIUM 8.0*  --  7.9* 8.0*  MG  --  1.7 2.1  --    Liver Function Tests: Recent Labs  Lab 04/18/18 1247 04/19/18 0813 04/24/18 0439  AST 32 28 23  ALT 16 14 16   ALKPHOS 96 83 97  BILITOT 0.9 1.1 1.8*  PROT 7.0 6.3* 6.4*  ALBUMIN 4.0 3.4* 3.3*   Recent Labs  Lab 04/24/18 0439  LIPASE 29   Recent Labs  Lab 04/18/18 1249  AMMONIA 35   CBC: Recent Labs  Lab 04/18/18 1247 04/19/18 0813 04/24/18 0439  WBC 3.6* 2.9* 6.4  NEUTROABS 2.1  --  4.5  HGB 9.3* 9.0* 9.4*  HCT 32.3* 30.9* 33.9*  MCV 81.6 80.7 82.5  PLT 121* 94* 93*   Cardiac Enzymes: Recent Labs  Lab 04/18/18 1247 04/19/18 0813 04/24/18 0439  CKTOTAL  --   --  105  TROPONINI <0.03 <0.03  --     BNP (last 3 results) Recent Labs    04/18/18 1249 04/19/18 0813 04/24/18 0439  BNP 313.9* 231.2* 461.5*    ProBNP (last 3 results) No results for input(s): PROBNP in the last 8760 hours.  CBG: No results for input(s): GLUCAP in the last 168 hours.  Radiological Exams on Admission: Ct Head Wo Contrast  Result Date: 04/24/2018 CLINICAL DATA:  Head trauma. Neck and back pain. EXAM: CT HEAD WITHOUT CONTRAST CT CERVICAL SPINE WITHOUT CONTRAST TECHNIQUE: Multidetector CT imaging of the head and cervical spine was performed following the standard protocol without intravenous contrast. Multiplanar CT image reconstructions of the cervical spine were also generated. COMPARISON:  Head CT 04/18/2018 FINDINGS: CT HEAD FINDINGS Brain: There is no mass, hemorrhage or extra-axial collection. The size and configuration of the ventricles and extra-axial CSF spaces are normal. There is hypoattenuation of the periventricular white matter, most commonly indicating chronic ischemic microangiopathy.  Vascular: No abnormal hyperdensity  of the major intracranial arteries or dural venous sinuses. No intracranial atherosclerosis. Skull: The visualized skull base, calvarium and extracranial soft tissues are normal. Sinuses/Orbits: No fluid levels or advanced mucosal thickening of the visualized paranasal sinuses. No mastoid or middle ear effusion. The orbits are normal. CT CERVICAL SPINE FINDINGS Alignment: No static subluxation. Facets are aligned. Occipital condyles are normally positioned. Skull base and vertebrae: No acute fracture. Soft tissues and spinal canal: No prevertebral fluid or swelling. No visible canal hematoma. Disc levels: No advanced spinal canal or neural foraminal stenosis. Upper chest: No pneumothorax, pulmonary nodule or pleural effusion. Other: Normal visualized paraspinal cervical soft tissues. IMPRESSION: 1. No acute intracranial abnormality. 2. No acute fracture or static subluxation of the cervical spine. Electronically Signed   By: Deatra Robinson M.D.   On: 04/24/2018 05:23   Ct Cervical Spine Wo Contrast  Result Date: 04/24/2018 CLINICAL DATA:  Head trauma. Neck and back pain. EXAM: CT HEAD WITHOUT CONTRAST CT CERVICAL SPINE WITHOUT CONTRAST TECHNIQUE: Multidetector CT imaging of the head and cervical spine was performed following the standard protocol without intravenous contrast. Multiplanar CT image reconstructions of the cervical spine were also generated. COMPARISON:  Head CT 04/18/2018 FINDINGS: CT HEAD FINDINGS Brain: There is no mass, hemorrhage or extra-axial collection. The size and configuration of the ventricles and extra-axial CSF spaces are normal. There is hypoattenuation of the periventricular white matter, most commonly indicating chronic ischemic microangiopathy. Vascular: No abnormal hyperdensity of the major intracranial arteries or dural venous sinuses. No intracranial atherosclerosis. Skull: The visualized skull base, calvarium and extracranial soft tissues are  normal. Sinuses/Orbits: No fluid levels or advanced mucosal thickening of the visualized paranasal sinuses. No mastoid or middle ear effusion. The orbits are normal. CT CERVICAL SPINE FINDINGS Alignment: No static subluxation. Facets are aligned. Occipital condyles are normally positioned. Skull base and vertebrae: No acute fracture. Soft tissues and spinal canal: No prevertebral fluid or swelling. No visible canal hematoma. Disc levels: No advanced spinal canal or neural foraminal stenosis. Upper chest: No pneumothorax, pulmonary nodule or pleural effusion. Other: Normal visualized paraspinal cervical soft tissues. IMPRESSION: 1. No acute intracranial abnormality. 2. No acute fracture or static subluxation of the cervical spine. Electronically Signed   By: Deatra Robinson M.D.   On: 04/24/2018 05:23   Dg Chest Port 1 View  Result Date: 04/24/2018 CLINICAL DATA:  Shortness of breath. EXAM: PORTABLE CHEST 1 VIEW COMPARISON:  CT chest 04/11/2018.  PA and lateral chest 01/25/2018. FINDINGS: The lungs are clear. Heart size is upper normal. No pneumothorax or pleural fluid. No acute or focal bony abnormality. IMPRESSION: No acute disease. Electronically Signed   By: Drusilla Kanner M.D.   On: 04/24/2018 11:07   Vas [place] Lower Extremity Venous (dvt) (only [facility])  Result Date: 04/24/2018  Lower Venous Study Indications: Edema.  Performing Technologist: Toma Deiters RVS  Examination Guidelines: A complete evaluation includes B-mode imaging, spectral Doppler, color Doppler, and power Doppler as needed of all accessible portions of each vessel. Bilateral testing is considered an integral part of a complete examination. Limited examinations for reoccurring indications may be performed as noted.  Right Venous Findings: +---------+---------------+---------+-----------+----------+-------------------+            Compressibility Phasicity Spontaneity Properties Summary               +---------+---------------+---------+-----------+----------+-------------------+  CFV       Full            Yes  Yes                                         +---------+---------------+---------+-----------+----------+-------------------+  SFJ       Full                                                                  +---------+---------------+---------+-----------+----------+-------------------+  FV Prox   Full            Yes       Yes                                         +---------+---------------+---------+-----------+----------+-------------------+  FV Mid    Full                                                                  +---------+---------------+---------+-----------+----------+-------------------+  FV Distal Full            Yes       Yes                                         +---------+---------------+---------+-----------+----------+-------------------+  PFV       Full            Yes       Yes                                         +---------+---------------+---------+-----------+----------+-------------------+  POP       Full            Yes       Yes                                         +---------+---------------+---------+-----------+----------+-------------------+  PTV       Full                                                                  +---------+---------------+---------+-----------+----------+-------------------+  PERO                                                       Difficult to image  due to edema         +---------+---------------+---------+-----------+----------+-------------------+  Right Technical Findings: Diffiuclt to image the peroneal vein due to edema. Color flow and Doppler appear normal. Compression image difficult to visualize.    Summary: Right: There is no evidence of deep vein thrombosis in the lower extremity. No cystic structure found in the popliteal fossa. No change since exam of 03/17/2018  Left: There is no evidence of a common femoral vein obstruction  *See table(s) above for measurements and observations. Electronically signed by Lemar Livings MD on 04/24/2018 at 2:18:29 PM.    Final     EKG: Independently reviewed.  Normal sinus rhythm, no change from previous  Assessment/Plan Present on Admission:  Alcohol use disorder, severe, dependence (HCC)  GERD (gastroesophageal reflux disease)  Hyponatremia  Bipolar 1 disorder (HCC)  Tobacco abuse  Macrocytic anemia- due to alcohol abuse with normal B12 & folate levels  Severe protein-calorie malnutrition (HCC)  Atrial fibrillation with rapid ventricular response (HCC)  Multiple sclerosis (HCC)  Active Problems:   Hyponatremia   GERD (gastroesophageal reflux disease)   Bipolar 1 disorder (HCC)   Tobacco abuse: Nicotine patch   Macrocytic anemia- due to alcohol abuse with normal B12 & folate levels   Severe protein-calorie malnutrition (HCC): Nutrition to see   Alcohol use disorder, severe, dependence (HCC): Ongoing.  Patient was still an elevated alcohol level despite being in the emergency room all night and levels checked in the morning.  Monitor for withdrawals, on CIWA protocol   Multiple sclerosis (HCC): See below.  Potential possibility this could be a flare, but not felt to be likely   Atrial fibrillation with rapid ventricular response [facility]): Currently normal sinus rhythm, monitor closely   Gait abnormality: Principal problem.  Suspect may be more multifactorial as her exam is nonspecific although some of this may be secondary to her Haldol she received.  Neurology feels that this is less likely an MS flare may be more from a combination of chronic alcohol use, acute alcohol use and receiving her Haldol   DVT prophylaxis: Subcu heparin  Code Status: Full code   Family Communication: Family present earlier, left message for them now  Disposition Plan: Hopefully patient will stay until work-up complete, her  mobility improves  Consults called: Tele-neurology  Admission status: Observe overnight given hopeful chances for improvement    Hollice Espy MD [facility] Pager [phone]  If 7PM-7AM, please contact night-coverage [email or web address] Password [facility]  04/24/2018, 6:59 PM

## 2018-04-24 NOTE — ED Notes (Signed)
Patient given coffee, Gingerale, crackers, and sandwich. Waiting for food tray.

## 2018-04-24 NOTE — ED Notes (Signed)
Report given to 4 E

## 2018-04-24 NOTE — Progress Notes (Signed)
Right lower extremity venous duplex completed. Preliminary results in Chart review CV Proc and given to Dr. Derwood Kaplan, MD. [facility] 04/24/2018 12:50 PM

## 2018-04-24 NOTE — ED Notes (Addendum)
[phone] (Teleneurology STAT consult dispatch).
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
## 2018-04-24 NOTE — ED Notes (Signed)
Called tele neuro dispatch and explained that there has not been any call ins on the cart. Dispatched stated that the doctor was having difficulties on their side logging in and will try again in next 5 min.

## 2018-04-27 ENCOUNTER — Inpatient Hospital Stay (HOSPITAL_COMMUNITY)
Admission: EM | Admit: 2018-04-27 | Discharge: 2018-04-29 | DRG: 897 | Disposition: A | Discharge status: Home / Self Care | Payer: Medicare Other | Attending: Internal Medicine | Admitting: Internal Medicine

## 2018-04-27 ENCOUNTER — Observation Stay (HOSPITAL_COMMUNITY): Payer: Medicare Other

## 2018-04-27 ENCOUNTER — Other Ambulatory Visit: Payer: Self-pay

## 2018-04-27 ENCOUNTER — Encounter (HOSPITAL_COMMUNITY): Payer: Self-pay

## 2018-04-27 DIAGNOSIS — F10129 Alcohol abuse with intoxication, unspecified: Secondary | ICD-10-CM | POA: Diagnosis present

## 2018-04-27 DIAGNOSIS — E876 Hypokalemia: Secondary | ICD-10-CM

## 2018-04-27 DIAGNOSIS — F17210 Nicotine dependence, cigarettes, uncomplicated: Secondary | ICD-10-CM | POA: Diagnosis present

## 2018-04-27 DIAGNOSIS — Z7951 Long term (current) use of inhaled steroids: Secondary | ICD-10-CM

## 2018-04-27 DIAGNOSIS — J449 Chronic obstructive pulmonary disease, unspecified: Secondary | ICD-10-CM | POA: Diagnosis present

## 2018-04-27 DIAGNOSIS — Z8249 Family history of ischemic heart disease and other diseases of the circulatory system: Secondary | ICD-10-CM

## 2018-04-27 DIAGNOSIS — F10231 Alcohol dependence with withdrawal delirium: Secondary | ICD-10-CM | POA: Diagnosis present

## 2018-04-27 DIAGNOSIS — D696 Thrombocytopenia, unspecified: Secondary | ICD-10-CM | POA: Diagnosis present

## 2018-04-27 DIAGNOSIS — D509 Iron deficiency anemia, unspecified: Secondary | ICD-10-CM | POA: Diagnosis present

## 2018-04-27 DIAGNOSIS — I480 Paroxysmal atrial fibrillation: Secondary | ICD-10-CM | POA: Diagnosis present

## 2018-04-27 DIAGNOSIS — G43909 Migraine, unspecified, not intractable, without status migrainosus: Secondary | ICD-10-CM | POA: Diagnosis present

## 2018-04-27 DIAGNOSIS — F10931 Alcohol use, unspecified with withdrawal delirium: Secondary | ICD-10-CM | POA: Diagnosis present

## 2018-04-27 DIAGNOSIS — R627 Adult failure to thrive: Secondary | ICD-10-CM | POA: Diagnosis present

## 2018-04-27 DIAGNOSIS — S0083XA Contusion of other part of head, initial encounter: Secondary | ICD-10-CM | POA: Diagnosis present

## 2018-04-27 DIAGNOSIS — F10221 Alcohol dependence with intoxication delirium: Secondary | ICD-10-CM | POA: Diagnosis not present

## 2018-04-27 DIAGNOSIS — F1020 Alcohol dependence, uncomplicated: Secondary | ICD-10-CM

## 2018-04-27 DIAGNOSIS — Z720 Tobacco use: Secondary | ICD-10-CM

## 2018-04-27 DIAGNOSIS — L03311 Cellulitis of abdominal wall: Secondary | ICD-10-CM

## 2018-04-27 DIAGNOSIS — J9611 Chronic respiratory failure with hypoxia: Secondary | ICD-10-CM | POA: Diagnosis present

## 2018-04-27 DIAGNOSIS — G35 Multiple sclerosis: Secondary | ICD-10-CM | POA: Diagnosis present

## 2018-04-27 DIAGNOSIS — Y907 Blood alcohol level of 200-239 mg/100 ml: Secondary | ICD-10-CM | POA: Diagnosis present

## 2018-04-27 DIAGNOSIS — F10920 Alcohol use, unspecified with intoxication, uncomplicated: Secondary | ICD-10-CM

## 2018-04-27 DIAGNOSIS — Z820 Family history of epilepsy and other diseases of the nervous system: Secondary | ICD-10-CM

## 2018-04-27 DIAGNOSIS — K7031 Alcoholic cirrhosis of liver with ascites: Secondary | ICD-10-CM | POA: Diagnosis present

## 2018-04-27 DIAGNOSIS — W19XXXA Unspecified fall, initial encounter: Secondary | ICD-10-CM | POA: Diagnosis present

## 2018-04-27 DIAGNOSIS — I471 Supraventricular tachycardia: Secondary | ICD-10-CM | POA: Diagnosis present

## 2018-04-27 DIAGNOSIS — I4891 Unspecified atrial fibrillation: Secondary | ICD-10-CM

## 2018-04-27 DIAGNOSIS — R531 Weakness: Secondary | ICD-10-CM

## 2018-04-27 DIAGNOSIS — F909 Attention-deficit hyperactivity disorder, unspecified type: Secondary | ICD-10-CM | POA: Diagnosis present

## 2018-04-27 DIAGNOSIS — F419 Anxiety disorder, unspecified: Secondary | ICD-10-CM | POA: Diagnosis present

## 2018-04-27 DIAGNOSIS — Z79899 Other long term (current) drug therapy: Secondary | ICD-10-CM

## 2018-04-27 DIAGNOSIS — K219 Gastro-esophageal reflux disease without esophagitis: Secondary | ICD-10-CM | POA: Diagnosis present

## 2018-04-27 DIAGNOSIS — F10239 Alcohol dependence with withdrawal, unspecified: Secondary | ICD-10-CM | POA: Diagnosis present

## 2018-04-27 DIAGNOSIS — F319 Bipolar disorder, unspecified: Secondary | ICD-10-CM | POA: Diagnosis present

## 2018-04-27 LAB — COMPREHENSIVE METABOLIC PANEL
ALT: 17 U/L (ref 0–44)
AST: 27 U/L (ref 15–41)
Albumin: 3.3 g/dL — ABNORMAL LOW (ref 3.5–5.0)
Alkaline Phosphatase: 112 U/L (ref 38–126)
Anion gap: 15 (ref 5–15)
BUN: <5 mg/dL — ABNORMAL LOW (ref 6–20)
CHLORIDE: 92 mmol/L — AB (ref 98–111)
CO2: 33 mmol/L — ABNORMAL HIGH (ref 22–32)
Calcium: 7.7 mg/dL — ABNORMAL LOW (ref 8.9–10.3)
Creatinine, Ser: 0.42 mg/dL — ABNORMAL LOW (ref 0.44–1.00)
GFR calc Af Amer: >60 mL/min (ref >60)
GFR calc non Af Amer: >60 mL/min (ref >60)
Glucose, Bld: 100 mg/dL — ABNORMAL HIGH (ref 70–99)
Potassium: 2.1 mmol/L — CL (ref 3.5–5.1)
Sodium: 140 mmol/L (ref 135–145)
Total Bilirubin: 1.4 mg/dL — ABNORMAL HIGH (ref 0.3–1.2)
Total Protein: 6.7 g/dL (ref 6.5–8.1)

## 2018-04-27 LAB — CBC WITH DIFFERENTIAL/PLATELET
Abs Immature Granulocytes: 0.01 10*3/uL (ref 0.00–0.07)
BASOS PCT: 2 %
Basophils Absolute: 0.1 10*3/uL (ref 0.0–0.1)
Eosinophils Absolute: 0 10*3/uL (ref 0.0–0.5)
Eosinophils Relative: 1 %
HCT: 37.3 % (ref 36.0–46.0)
Hemoglobin: 10.7 g/dL — ABNORMAL LOW (ref 12.0–15.0)
Immature Granulocytes: 0 %
Lymphocytes Relative: 19 %
Lymphs Abs: 0.8 10*3/uL (ref 0.7–4.0)
MCH: 23.1 pg — ABNORMAL LOW (ref 26.0–34.0)
MCHC: 28.7 g/dL — AB (ref 30.0–36.0)
MCV: 80.6 fL (ref 80.0–100.0)
Monocytes Absolute: 0.7 10*3/uL (ref 0.1–1.0)
Monocytes Relative: 15 %
Neutro Abs: 2.8 10*3/uL (ref 1.7–7.7)
Neutrophils Relative %: 63 %
Platelets: 154 10*3/uL (ref 150–400)
RBC: 4.63 MIL/uL (ref 3.87–5.11)
RDW: 21.1 % — ABNORMAL HIGH (ref 11.5–15.5)
WBC: 4.4 10*3/uL (ref 4.0–10.5)
nRBC: 0 % (ref 0.0–0.2)

## 2018-04-27 LAB — URINALYSIS, ROUTINE W REFLEX MICROSCOPIC
Bilirubin Urine: NEGATIVE
Glucose, UA: NEGATIVE mg/dL
Ketones, ur: NEGATIVE mg/dL
Nitrite: NEGATIVE
Protein, ur: NEGATIVE mg/dL
Specific Gravity, Urine: 1.006 (ref 1.005–1.030)
pH: 7 (ref 5.0–8.0)

## 2018-04-27 LAB — AMMONIA: Ammonia: 22 umol/L (ref 9–35)

## 2018-04-27 LAB — PHOSPHORUS: PHOSPHORUS: 3.5 mg/dL (ref 2.5–4.6)

## 2018-04-27 LAB — LACTIC ACID, PLASMA
Lactic Acid, Venous: 2.5 mmol/L (ref 0.5–1.9)
Lactic Acid, Venous: 2.7 mmol/L (ref 0.5–1.9)

## 2018-04-27 LAB — BASIC METABOLIC PANEL
Anion gap: 12 (ref 5–15)
BUN: <5 mg/dL — ABNORMAL LOW (ref 6–20)
CO2: 33 mmol/L — ABNORMAL HIGH (ref 22–32)
CREATININE: 0.43 mg/dL — AB (ref 0.44–1.00)
Calcium: 7.6 mg/dL — ABNORMAL LOW (ref 8.9–10.3)
Chloride: 90 mmol/L — ABNORMAL LOW (ref 98–111)
GFR calc Af Amer: >60 mL/min (ref >60)
GFR calc non Af Amer: >60 mL/min (ref >60)
Glucose, Bld: 99 mg/dL (ref 70–99)
Potassium: 2.7 mmol/L — CL (ref 3.5–5.1)
Sodium: 135 mmol/L (ref 135–145)

## 2018-04-27 LAB — LIPASE, BLOOD: LIPASE: 28 U/L (ref 11–51)

## 2018-04-27 LAB — MAGNESIUM: MAGNESIUM: 1.5 mg/dL — AB (ref 1.7–2.4)

## 2018-04-27 LAB — ETHANOL: Alcohol, Ethyl (B): 219 mg/dL — ABNORMAL HIGH (ref <10)

## 2018-04-27 LAB — TROPONIN I: Troponin I: <0.03 ng/mL (ref <0.03)

## 2018-04-27 MED ORDER — SENNOSIDES-DOCUSATE SODIUM 8.6-50 MG PO TABS: 1.0000 | ORAL_TABLET | Freq: Every evening | ORAL | Status: DC | PRN

## 2018-04-27 MED ORDER — POTASSIUM CHLORIDE CRYS ER 20 MEQ PO TBCR
40.0000 meq | EXTENDED_RELEASE_TABLET | ORAL | Status: AC
  Administered 2018-04-27 (×2): 40 meq via ORAL
  Filled 2018-04-27 (×2): qty 2

## 2018-04-27 MED ORDER — LORAZEPAM 1 MG PO TABS
1.0000 mg | ORAL_TABLET | Freq: Four times a day (QID) | ORAL | Status: DC | PRN
  Administered 2018-04-27 – 2018-04-28 (×6): 1 mg via ORAL
  Filled 2018-04-27 (×6): qty 1

## 2018-04-27 MED ORDER — ALUM & MAG HYDROXIDE-SIMETH 200-200-20 MG/5ML PO SUSP
30.0000 mL | Freq: Four times a day (QID) | ORAL | Status: DC | PRN
  Administered 2018-04-27 – 2018-04-28 (×3): 30 mL via ORAL
  Filled 2018-04-27 (×3): qty 30

## 2018-04-27 MED ORDER — ONDANSETRON 4 MG PO TBDP
4.0000 mg | ORAL_TABLET | Freq: Once | ORAL | Status: AC
  Administered 2018-04-27: 4 mg via ORAL
  Filled 2018-04-27: qty 1

## 2018-04-27 MED ORDER — CHLORDIAZEPOXIDE HCL 5 MG PO CAPS: 5.0000 mg | ORAL_CAPSULE | Freq: Three times a day (TID) | ORAL | Status: DC

## 2018-04-27 MED ORDER — SODIUM CHLORIDE 0.9% FLUSH
3.0000 mL | Freq: Two times a day (BID) | INTRAVENOUS | Status: DC
  Administered 2018-04-27 – 2018-04-29 (×3): 3 mL via INTRAVENOUS

## 2018-04-27 MED ORDER — SODIUM CHLORIDE 0.9 % IV SOLN
1.0000 g | INTRAVENOUS | Status: DC
  Administered 2018-04-27 – 2018-04-29 (×3): 1 g via INTRAVENOUS
  Filled 2018-04-27 (×3): qty 1

## 2018-04-27 MED ORDER — IOPAMIDOL (ISOVUE-300) INJECTION 61%
100.0000 mL | Freq: Once | INTRAVENOUS | Status: AC | PRN
  Administered 2018-04-27: 100 mL via INTRAVENOUS

## 2018-04-27 MED ORDER — ENSURE MAX PROTEIN PO LIQD
11.0000 [oz_av] | Freq: Every day | ORAL | Status: DC
  Filled 2018-04-27: qty 330

## 2018-04-27 MED ORDER — FOLIC ACID 1 MG PO TABS
1.0000 mg | ORAL_TABLET | Freq: Every day | ORAL | Status: DC
  Administered 2018-04-27 – 2018-04-29 (×3): 1 mg via ORAL
  Filled 2018-04-27 (×3): qty 1

## 2018-04-27 MED ORDER — IPRATROPIUM-ALBUTEROL 0.5-2.5 (3) MG/3ML IN SOLN: 3.0000 mL | Freq: Four times a day (QID) | RESPIRATORY_TRACT | Status: DC | PRN

## 2018-04-27 MED ORDER — ZOLPIDEM TARTRATE 5 MG PO TABS
5.0000 mg | ORAL_TABLET | Freq: Once | ORAL | Status: AC
  Administered 2018-04-27: 5 mg via ORAL
  Filled 2018-04-27: qty 1

## 2018-04-27 MED ORDER — POTASSIUM CHLORIDE CRYS ER 20 MEQ PO TBCR
40.0000 meq | EXTENDED_RELEASE_TABLET | Freq: Once | ORAL | Status: AC
  Administered 2018-04-27: 40 meq via ORAL
  Filled 2018-04-27: qty 2

## 2018-04-27 MED ORDER — ONDANSETRON 4 MG PO TBDP
4.0000 mg | ORAL_TABLET | Freq: Three times a day (TID) | ORAL | Status: DC | PRN
  Administered 2018-04-27 – 2018-04-29 (×3): 4 mg via ORAL
  Filled 2018-04-27 (×3): qty 1

## 2018-04-27 MED ORDER — GABAPENTIN 100 MG PO CAPS
200.0000 mg | ORAL_CAPSULE | Freq: Three times a day (TID) | ORAL | Status: DC
  Administered 2018-04-27 – 2018-04-29 (×8): 200 mg via ORAL
  Filled 2018-04-27 (×8): qty 2

## 2018-04-27 MED ORDER — ACETAMINOPHEN 325 MG PO TABS
650.0000 mg | ORAL_TABLET | Freq: Once | ORAL | Status: AC
  Administered 2018-04-27: 650 mg via ORAL
  Filled 2018-04-27: qty 2

## 2018-04-27 MED ORDER — POTASSIUM CHLORIDE 10 MEQ/100ML IV SOLN
10.0000 meq | Freq: Once | INTRAVENOUS | Status: AC
  Administered 2018-04-27: 10 meq via INTRAVENOUS
  Filled 2018-04-27: qty 100

## 2018-04-27 MED ORDER — CHLORDIAZEPOXIDE HCL 25 MG PO CAPS
25.0000 mg | ORAL_CAPSULE | Freq: Four times a day (QID) | ORAL | Status: AC
  Administered 2018-04-27 (×3): 25 mg via ORAL
  Filled 2018-04-27 (×3): qty 1

## 2018-04-27 MED ORDER — THIAMINE HCL 100 MG/ML IJ SOLN: 100.0000 mg | Freq: Every day | INTRAMUSCULAR | Status: DC

## 2018-04-27 MED ORDER — LORAZEPAM 2 MG/ML IJ SOLN: 1.0000 mg | Freq: Four times a day (QID) | INTRAMUSCULAR | Status: DC | PRN

## 2018-04-27 MED ORDER — CHLORDIAZEPOXIDE HCL 25 MG PO CAPS
25.0000 mg | ORAL_CAPSULE | Freq: Three times a day (TID) | ORAL | Status: AC
  Administered 2018-04-28 (×3): 25 mg via ORAL
  Filled 2018-04-27 (×3): qty 1

## 2018-04-27 MED ORDER — ONDANSETRON HCL 4 MG/2ML IJ SOLN
4.0000 mg | Freq: Four times a day (QID) | INTRAMUSCULAR | Status: DC | PRN
  Administered 2018-04-27 (×2): 4 mg via INTRAVENOUS
  Filled 2018-04-27 (×2): qty 2

## 2018-04-27 MED ORDER — ADULT MULTIVITAMIN W/MINERALS CH
1.0000 | ORAL_TABLET | Freq: Every day | ORAL | Status: DC
  Administered 2018-04-27 – 2018-04-29 (×3): 1 via ORAL
  Filled 2018-04-27 (×3): qty 1

## 2018-04-27 MED ORDER — ENOXAPARIN SODIUM 40 MG/0.4ML ~~LOC~~ SOLN
40.0000 mg | SUBCUTANEOUS | Status: DC
  Administered 2018-04-27 – 2018-04-29 (×3): 40 mg via SUBCUTANEOUS
  Filled 2018-04-27 (×3): qty 0.4

## 2018-04-27 MED ORDER — METOCLOPRAMIDE HCL 5 MG/ML IJ SOLN: 10.0000 mg | Freq: Four times a day (QID) | INTRAMUSCULAR | Status: DC | PRN

## 2018-04-27 MED ORDER — NICOTINE 14 MG/24HR TD PT24
14.0000 mg | MEDICATED_PATCH | Freq: Every day | TRANSDERMAL | Status: DC | PRN
  Administered 2018-04-28: 14 mg via TRANSDERMAL
  Filled 2018-04-27 (×2): qty 1

## 2018-04-27 MED ORDER — PANTOPRAZOLE SODIUM 40 MG PO TBEC
40.0000 mg | DELAYED_RELEASE_TABLET | Freq: Every day | ORAL | Status: DC
  Administered 2018-04-27 – 2018-04-29 (×3): 40 mg via ORAL
  Filled 2018-04-27 (×3): qty 1

## 2018-04-27 MED ORDER — SODIUM CHLORIDE (PF) 0.9 % IJ SOLN
INTRAMUSCULAR | Status: AC
  Filled 2018-04-27: qty 50

## 2018-04-27 MED ORDER — DILTIAZEM HCL 60 MG PO TABS
60.0000 mg | ORAL_TABLET | Freq: Three times a day (TID) | ORAL | Status: DC
  Administered 2018-04-27 – 2018-04-29 (×8): 60 mg via ORAL
  Filled 2018-04-27 (×9): qty 1

## 2018-04-27 MED ORDER — VITAMIN B-1 100 MG PO TABS
100.0000 mg | ORAL_TABLET | Freq: Every day | ORAL | Status: DC
  Administered 2018-04-27 – 2018-04-29 (×3): 100 mg via ORAL
  Filled 2018-04-27 (×3): qty 1

## 2018-04-27 MED ORDER — CHLORDIAZEPOXIDE HCL 5 MG PO CAPS
10.0000 mg | ORAL_CAPSULE | Freq: Three times a day (TID) | ORAL | Status: DC
  Administered 2018-04-29 (×2): 10 mg via ORAL
  Filled 2018-04-27 (×3): qty 2

## 2018-04-27 MED ORDER — FLUOXETINE HCL 20 MG PO CAPS
20.0000 mg | ORAL_CAPSULE | Freq: Every day | ORAL | Status: DC
  Administered 2018-04-27 – 2018-04-29 (×3): 20 mg via ORAL
  Filled 2018-04-27 (×3): qty 1

## 2018-04-27 MED ORDER — SODIUM CHLORIDE 0.9 % IV BOLUS
500.0000 mL | Freq: Once | INTRAVENOUS | Status: AC
  Administered 2018-04-27: 500 mL via INTRAVENOUS

## 2018-04-27 MED ORDER — SPIRONOLACTONE 25 MG PO TABS
25.0000 mg | ORAL_TABLET | Freq: Every day | ORAL | Status: DC
  Administered 2018-04-27 – 2018-04-29 (×3): 25 mg via ORAL
  Filled 2018-04-27 (×3): qty 1

## 2018-04-27 MED ORDER — MOMETASONE FURO-FORMOTEROL FUM 200-5 MCG/ACT IN AERO
2.0000 | INHALATION_SPRAY | Freq: Two times a day (BID) | RESPIRATORY_TRACT | Status: DC
  Administered 2018-04-27 – 2018-04-29 (×4): 2 via RESPIRATORY_TRACT
  Filled 2018-04-27: qty 8.8

## 2018-04-27 MED ORDER — MAGNESIUM SULFATE 2 GM/50ML IV SOLN
2.0000 g | Freq: Once | INTRAVENOUS | Status: AC
  Administered 2018-04-27: 2 g via INTRAVENOUS
  Filled 2018-04-27: qty 50

## 2018-04-27 MED ORDER — SODIUM CHLORIDE 0.9 % IV SOLN: 1.0000 g | Freq: Once | INTRAVENOUS | Status: DC

## 2018-04-27 MED ORDER — IOPAMIDOL (ISOVUE-300) INJECTION 61%
INTRAVENOUS | Status: AC
  Filled 2018-04-27: qty 100

## 2018-04-27 MED ORDER — ENSURE ENLIVE PO LIQD
237.0000 mL | Freq: Three times a day (TID) | ORAL | Status: DC
  Administered 2018-04-29: 237 mL via ORAL

## 2018-04-27 NOTE — ED Notes (Signed)
Patient transported to CT 

## 2018-04-27 NOTE — ED Notes (Signed)
Pt resting in bed comfortably. Given ice chips and blanket. No acute distress
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
## 2018-04-27 NOTE — Progress Notes (Signed)
Initial Nutrition Assessment  DOCUMENTATION CODES:   Obesity unspecified  INTERVENTION:    Ensure Enlive po TID, each supplement provides 350 kcal and 20 grams of protein  Continue MVI daily  Recommend checking phosphorus level.   Monitor magnesium, potassium, and phosphorus daily for at least 3 days, MD to replete as needed, as pt is at risk for refeeding syndrome.   NUTRITION DIAGNOSIS:   Inadequate oral intake related to social / environmental circumstances(ETOH abuse) as evidenced by per patient/family report.  GOAL:   Patient will meet greater than or equal to 90% of their needs   MONITOR:   PO intake, Supplement acceptance, Weight trends, Labs  REASON FOR ASSESSMENT:   Consult Assessment of nutrition requirement/status  ASSESSMENT:   Patient with PMH significant for long-standing alcoholism/dependence, hepatic cirrhosis with ascites, MS, incarcerated ventral hernia s/p laparotomy/repair 2018, and bipolar disorder. Presents this admission with failure to thrive complicated by alcoholism.    Pt endorses having a poor appetite over the last 2-3 weeks due to excessive alcohol intake and feelings of weakness. States during this time she would eat 2-3 [place] sandwiches a day and sometimes have a lean cuisine. Pt drinks 1-2 bottles of wine daily. Unsure if pt has difficulty getting food at home.She does not use supplementation. RD observed meal tray with omelet 25% completed. Discussed the importance of protein intake for preservation of lean body mass. Discussed the effect alcohol has on nutrition status. Pt amendable to Ensure this admission.   Pt denies any recent wt loss from her UBW of 190 lb. Records indicate pt has gained wt over the last year. Unsure if fluid fluctuations with ongoing ascites are masking losses. Nutrition-Focused physical exam completed. Pt shows to have bilateral lower extremity edema. Suspect pt is malnourished but unable to diagnosis with intake  alone.   Medications reviewed and include: folic acid, MVI with minerals, aldactone, thiamine Labs reviewed: K 2.1 (L) Mg 1.5 (L)   NUTRITION - FOCUSED PHYSICAL EXAM:    Most Recent Value  Orbital Region  No depletion  Upper Arm Region  No depletion  Thoracic and Lumbar Region  Unable to assess  Buccal Region  No depletion  Temple Region  No depletion  Clavicle Bone Region  No depletion  Clavicle and Acromion Bone Region  No depletion  Scapular Bone Region  Unable to assess  Dorsal Hand  No depletion  Patellar Region  No depletion  Anterior Thigh Region  No depletion  Posterior Calf Region  No depletion  Edema (RD Assessment)  Mild  Hair  Reviewed  Eyes  Reviewed  Mouth  Reviewed  Skin  Reviewed  Nails  Reviewed     Diet Order:   Diet Order            Diet Heart Room service appropriate? Yes; Fluid consistency: Thin  Diet effective now              EDUCATION NEEDS:   Education needs have been addressed  Skin:  Skin Assessment: Skin Integrity Issues: Skin Integrity Issues:: Other (Comment) Other: MASD- bilateral legs   Last BM:  3/16  Height:   Ht Readings from Last 1 Encounters:  04/27/18 5\' 2"  (1.575 m)    Weight:   Wt Readings from Last 1 Encounters:  04/27/18 83 kg    Ideal Body Weight:  50 kg  BMI:  Body mass index is 33.47 kg/m.  Estimated Nutritional Needs:   Kcal:  1600-1800 kcal  Protein:  80-100  grams  Fluid:  >/= 1.6 L/day   Vanessa Kick RD, LDN Clinical Nutrition Pager # [phone]

## 2018-04-27 NOTE — Progress Notes (Signed)
OT Cancellation Note  Patient Details Name: Anne Black MRN: [id number] DOB: 08/18/1960   Cancelled Treatment:    Reason Eval/Treat Not Completed: Other (comment). Pt just used commode and feels nauseated. Did not want to get up again. Will check back tomorrow.  Kenneith Stief 04/27/2018, 1:46 PM  Marica Otter, OTR/L Acute Rehabilitation Services [phone] [facility] pager [phone] office 04/27/2018

## 2018-04-27 NOTE — ED Notes (Signed)
Pt alert, cooperative at this time.  Plan of care discussed, pt agreeable to being transported upstairs.
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
## 2018-04-27 NOTE — ED Notes (Signed)
Pure wick placed.
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
## 2018-04-27 NOTE — Progress Notes (Signed)
Post void residual showed 400 cc's.  Per order foley catheter inserted with only 50 cc's returned.  Patient bladder scanned again with close to 400 cc's.  Dr. Jarvis Newcomer notified.  Patient has ascites that is interfering with bladder scan.  Foley catheter discontinued.

## 2018-04-27 NOTE — ED Notes (Signed)
Pt resting in bed with eyes closed breathing. IV checked and meds started per [facility]. Provider notified of pt refusal of PO potassium. No acute distress.

## 2018-04-27 NOTE — ED Notes (Signed)
Date and time results received: 04/27/18 0600 (use smartphrase ".now" to insert current time)  Test: Potassium Critical Value: 2.1  Name of Provider Notified: Dr. Geoffery Lyons  Orders Received? Or Actions Taken?: Orders Received - See Orders for details

## 2018-04-27 NOTE — ED Notes (Signed)
Pt resting. Given additional blanket. No acute distress

## 2018-04-27 NOTE — Progress Notes (Signed)
Upon arrival of shift patient was 88% on room air. Placed oxygen 2L sats 93%. Will continue to monitor.

## 2018-04-27 NOTE — ED Provider Notes (Addendum)
[facility]-EMERGENCY DEPT Provider Note   CSN: [id number] Arrival date & time: 04/27/18  0865    History   Chief Complaint Chief Complaint  Patient presents with  . Failure To Thrive  . Alcohol Intoxication    HPI Anne Black is a 58 y.o. female.     Patient is a 58 year old female with history of chronic alcoholism, cirrhosis, ADHD, A. fib, GERD, multiple sclerosis.  She was brought to the ER by EMS with complaints of weakness.  This patient is well-known to both EMS and the emergency department for frequent calls to 911 and frequent emergency department visits.  She was just recently admitted for failure to thrive, then signed out AGAINST MEDICAL ADVICE 2 days ago.  She is to returns tonight with complaints of weakness.  Is also complaining of discomfort in her abdominal wall.  She does admit to consuming alcohol this evening.  I am told that this patient's living conditions are extremely unclean and bordering on uninhabitable.  Apparently there are feces smeared on the furniture, toilet seats, with insects all throughout the home.  According to the paramedic that brought her in, this patient has called EMS 40 times in the past month.  The history is provided by the patient.  Alcohol Intoxication  This is a chronic problem. The problem occurs constantly. The problem has not changed since onset.Nothing aggravates the symptoms. Nothing relieves the symptoms.    Past Medical History:  Diagnosis Date  . ADHD (attention deficit hyperactivity disorder)   . Alcoholic cirrhosis (HCC)   . Alcoholism (HCC)   . Allergies   . Anxiety   . Arthritis   . Ascites   . Asthma   . Atrial fibrillation with RVR (HCC)   . Bipolar disorder (HCC)   . COPD (chronic obstructive pulmonary disease) (HCC)   . Depression   . Dyspnea   . Dysrhythmia   . GERD (gastroesophageal reflux disease)   . History of hiatal hernia   . Hypokalemia   . Migraine   . Multiple sclerosis  (HCC)   . Narcolepsy   . Osteoarthritis of knee   . Osteoporosis   . Pneumonia     Patient Active Problem List   Diagnosis Date Noted  . Gait abnormality 04/24/2018  . Respiratory failure with hypoxia (HCC) 04/18/2018  . Chronic hypoxemic respiratory failure (HCC) 04/11/2018  . Atrial fibrillation with rapid ventricular response (HCC) 03/23/2018  . Head injury   . Fall 03/19/2018  . Syncope 03/19/2018  . Orbital fracture 03/19/2018  . Scalp laceration 03/19/2018  . Acute respiratory failure with hypoxia and hypercarbia (HCC) 03/19/2018  . Abdominal tenderness 03/19/2018  . Acute on chronic respiratory failure with hypoxia (HCC) 03/17/2018  . Alcohol abuse with intoxication (HCC) 03/17/2018  . ETOH abuse   . Normocytic anemia 03/16/2018  . Acute metabolic encephalopathy 03/16/2018  . Lower leg edema 03/16/2018  . Migraine 03/16/2018  . A-fib (HCC) 02/18/2018  . Community acquired pneumonia of left lower lobe of lung (HCC)   . Weakness 08/21/2017  . COPD exacerbation (HCC) 06/12/2017  . Chest pain with moderate risk for cardiac etiology 05/30/2017  . Abnormal LFTs 05/30/2017  . Tachycardia 05/30/2017  . COPD (chronic obstructive pulmonary disease) (HCC) 04/19/2017  . Right rib fracture 04/15/2017  . Abdominal wall cellulitis 12/30/2016  . S/P exploratory laparotomy 12/30/2016  . Distal radius fracture, left 09/09/2016  . Thrombocytopenia (HCC) 09/09/2016  . Chest x-ray abnormality   . Umbilical hernia without  obstruction and without gangrene 05/12/2016  . Itching 02/24/2016  . Ventral hernia without obstruction or gangrene 02/24/2016  . Palliative care encounter   . Ascites   . Tachypnea   . Atrial flutter with rapid ventricular response (HCC)   . Hypomagnesemia   . Hypoxia   . Dehydration 01/22/2016  . Low back ache 12/30/2015  . Non-intractable vomiting with nausea   . Alcohol abuse with alcohol-induced mood disorder (HCC) 12/15/2015  . Acute alcoholism (HCC)    . Abdominal distension 11/04/2015  . Alcohol abuse   . Dyspnea   . Acute respiratory failure (HCC) 10/15/2015  . Ascites due to alcoholic cirrhosis (HCC) 10/15/2015  . Osteoporosis   . Multiple sclerosis (HCC)   . Alcoholic cirrhosis of liver with ascites (HCC) 08/22/2015  . Bipolar disorder, current episode mixed, moderate (HCC) 08/01/2015  . Alcohol use disorder, severe, dependence (HCC) 07/31/2015  . Macrocytic anemia- due to alcohol abuse with normal B12 & folate levels 05/31/2015  . Severe protein-calorie malnutrition (HCC) 05/31/2015  . Hypokalemia 05/26/2015  . Encephalopathy, hepatic (HCC) 05/26/2015  . Alcohol withdrawal (HCC) 05/18/2015  . Abdominal pain 05/18/2015  . Anxiety 05/18/2015  . Stimulant abuse (HCC) 12/27/2014  . Nicotine abuse 12/27/2014  . Hyperprolactinemia (HCC) 11/15/2014  . Dyslipidemia   . Alcohol use disorder, moderate, dependence (HCC) 10/30/2014  . Tobacco abuse 01/23/2014  . Bipolar 1 disorder (HCC) 04/07/2013  . Noncompliance with therapeutic plan 04/04/2013  . Hereditary and idiopathic peripheral neuropathy 05/01/2012  . Depression 05/01/2012  . GERD (gastroesophageal reflux disease) 05/01/2012  . Osteoarthrosis, unspecified whether generalized or localized, involving lower leg 05/01/2012  . Hyponatremia 07/01/2011    Past Surgical History:  Procedure Laterality Date  . ABDOMINAL WALL DEFECT REPAIR N/A 12/30/2016   Procedure: EXPLORATORY LAPAROTOMY WITH REPAIR ABDOMINAL WALL VENTRAL HERNIA;  Surgeon: [name], MD;  Location: [facility] OR;  Service: General;  Laterality: N/A;  . APPLICATION OF WOUND VAC N/A 12/30/2016   Procedure: APPLICATION OF WOUND VAC;  Surgeon: [name], MD;  Location: [facility] OR;  Service: General;  Laterality: N/A;  . CESAREAN SECTION  [phone]  . FRACTURE SURGERY    . HERNIA REPAIR    . MYRINGOTOMY WITH TUBE PLACEMENT Bilateral   . ORIF WRIST FRACTURE Left 09/09/2016   Procedure: OPEN REDUCTION INTERNAL  FIXATION (ORIF) LEFT WRIST FRACTURE, LEFT CARPAL TUNNEL RELEASE;  Surgeon: Dominica Severin, MD;  Location: [facility] ORS;  Service: Orthopedics;  Laterality: Left;  . TONSILLECTOMY       OB History   No obstetric history on file.      Home Medications    Prior to Admission medications   Medication Sig Start Date End Date Taking? Authorizing Provider  acetaminophen (TYLENOL) 500 MG tablet Take 1 tablet (500 mg total) by mouth every 6 (six) hours as needed for mild pain. Patient taking differently: Take 1,000 mg by mouth every 6 (six) hours as needed for mild pain.  03/28/18   Rolly Salter, MD  albuterol (PROVENTIL HFA;VENTOLIN HFA) 108 (90 Base) MCG/ACT inhaler Inhale 2 puffs into the lungs every 4 (four) hours as needed for wheezing or shortness of breath.    [provider]  aluminum-magnesium hydroxide-simethicone (MAALOX) 200-200-20 MG/5ML SUSP Take 30 mLs by mouth 4 (four) times daily as needed (indigestion).     [provider]  chlordiazePOXIDE (LIBRIUM) 25 MG capsule  PO TID x 1D, then 25-50mg  PO BID X 1D, then 25-50mg  PO QD X 1D Patient taking differently: Take 25-50  mg by mouth daily as needed for anxiety or withdrawal.  04/17/18   McDonald, Mia A, PA-C  diltiazem (CARDIZEM CD) 120 MG 24 hr capsule Take 1 capsule (120 mg total) by mouth daily for 30 days. 04/14/18 05/14/18  Tegeler, Canary Brim, MD  FLUoxetine (PROZAC) 20 MG capsule Take 1 capsule (20 mg total) by mouth daily for 30 doses. 04/14/18 05/14/18  Tegeler, Canary Brim, MD  Fluticasone-Salmeterol [facility] INHUB) 250-50 MCG/DOSE AEPB Inhale 2 puffs into the lungs 2 (two) times daily.     [provider]  furosemide (LASIX) 40 MG tablet Take 40 mg twice daily till 04/02/2018, after that take 40 mg daily. Patient taking differently: Take 40 mg by mouth daily. Take 40 mg twice daily till 04/02/2018, after that take 40 mg daily. 04/14/18   Tegeler, Canary Brim, MD  gabapentin (NEURONTIN) 100 MG capsule Take 2  capsules (200 mg total) by mouth 3 (three) times daily for 30 days. 04/14/18 05/14/18  Tegeler, Canary Brim, MD  hydrOXYzine (ATARAX/VISTARIL) 25 MG tablet Take 1 tablet (25 mg total) by mouth daily as needed for up to 30 doses (anxiety). Patient taking differently: Take 25 mg by mouth daily as needed for anxiety (withdrawal).  04/14/18   Tegeler, Canary Brim, MD  ipratropium-albuterol (DUONEB) 0.5-2.5 (3) MG/3ML SOLN Take 3 mLs by nebulization 2 (two) times daily. AND every 4 hours as needed for wheezing/shortness of breath    [provider]  lactulose (CHRONULAC) 10 GM/15ML solution Take 45 mLs (30 g total) by mouth 3 (three) times daily for 30 days. Patient not taking: Reported on 04/19/2018 04/14/18 05/14/18  Tegeler, Canary Brim, MD  lidocaine (LIDODERM) 5 % Place 1 patch onto the skin daily. Remove & Discard patch within 12 hours or as directed by MD 04/09/18   Nicanor Alcon, April, MD  magnesium oxide (MAG-OX) 400 MG tablet Take 400 mg by mouth daily.    [provider]  ondansetron (ZOFRAN-ODT) 4 MG disintegrating tablet Take 1 tablet (4 mg total) by mouth every 8 (eight) hours as needed for nausea or vomiting. 04/14/18   Tegeler, Canary Brim, MD  pantoprazole (PROTONIX) 40 MG tablet Take 1 tablet (40 mg total) by mouth daily for 30 days. 04/14/18 05/14/18  Tegeler, Canary Brim, MD  potassium chloride SA (K-DUR,KLOR-CON) 20 MEQ tablet Take 2 tablets (40 mEq total) by mouth 2 (two) times daily. 04/19/18   Bethel Born, PA-C  rizatriptan (MAXALT) 5 MG tablet Take 5 mg by mouth as needed for migraine. May repeat in 2 hours if needed; not to exceed 30mg  in 24 hours    [provider]  spironolactone (ALDACTONE) 25 MG tablet Take 1 tablet (25 mg total) by mouth daily for 30 days. 04/14/18 05/14/18  Tegeler, Canary Brim, MD    Family History Family History  Problem Relation Age of Onset  . Arrhythmia Mother   . Neuropathy Mother   . Coronary artery disease Mother   . Heart  disease Mother   . Heart disease Father   . Hypertension Father   . Heart attack Father   . Multiple sclerosis Maternal Aunt     Social History Social History   Tobacco Use  . Smoking status: Current Every Day Smoker    Packs/day: 1.00    Years: 35.00    Pack years: 35.00    Types: Cigarettes  . Smokeless tobacco: Never Used  . Tobacco comment: Pt states smoked this morning  Substance Use Topics  . Alcohol use:  Yes    Comment: 1 bottle of wine per day per patient.   . Drug use: No     Allergies   Patient has no known allergies.   Review of Systems Review of Systems  All other systems reviewed and are negative.    Physical Exam Updated Vital Signs There were no vitals taken for this visit.  Physical Exam Vitals signs and nursing note reviewed.  Constitutional:      General: She is not in acute distress.    Appearance: She is well-developed. She is not diaphoretic.     Comments: Patient is a chronically ill-appearing 58 year old female who appears much older than stated age.  She is in no acute distress, but does appear somewhat shaky and anxious.  HENT:     Head: Normocephalic and atraumatic.     Mouth/Throat:     Mouth: Mucous membranes are dry.  Neck:     Musculoskeletal: Normal range of motion and neck supple.  Cardiovascular:     Rate and Rhythm: Normal rate and regular rhythm.     Heart sounds: No murmur. No friction rub. No gallop.   Pulmonary:     Effort: Pulmonary effort is normal. No respiratory distress.     Breath sounds: Normal breath sounds. No wheezing.  Abdominal:     General: Bowel sounds are normal. There is no distension.     Palpations: Abdomen is soft.     Tenderness: There is no abdominal tenderness.  Musculoskeletal: Normal range of motion.     Right lower leg: Edema present.     Left lower leg: Edema present.     Comments: There is 2+ pitting edema of both lower extremities.  Skin:    General: Skin is warm and dry.   Neurological:     General: No focal deficit present.     Mental Status: She is alert and oriented to person, place, and time.     Cranial Nerves: No cranial nerve deficit.     Comments: Patient does move all 4 extremities spontaneously, but does have some tremor noted.      ED Treatments / Results  Labs (all labs ordered are listed, but only abnormal results are displayed) Labs Reviewed  COMPREHENSIVE METABOLIC PANEL  ETHANOL  LIPASE, BLOOD  CBC WITH DIFFERENTIAL/PLATELET  TROPONIN I  URINALYSIS, ROUTINE W REFLEX MICROSCOPIC  LACTIC ACID, PLASMA  LACTIC ACID, PLASMA  AMMONIA    EKG None  Radiology No results found.  Procedures Procedures (including critical care time)  Medications Ordered in ED Medications - No data to display   Initial Impression / Assessment and Plan / ED Course  I have reviewed the triage vital signs and the nursing notes.  Pertinent labs & imaging results that were available during my care of the patient were reviewed by me and considered in my medical decision making (see chart for details).  Patient with history of chronic alcoholism brought by EMS for evaluation of weakness.  Her home conditions are reportedly extremely poor from a sanitation standpoint. There are reported to be feces throughout the home, insects, and is very unsanitary.  Patient has longstanding history of alcoholism with cirrhosis and seems to have lost the ability to care for self.  She was recently admitted with similar issues, however signed out AGAINST MEDICAL ADVICE.  Patient's workup reveals a potassium of 2.1, what appears to be possibly a abdominal wall cellulitis, and is in and out of atrial fibrillation with rapid ventricular response.  Started on IV potassium and will be given Rocephin.    Patient's care discussed with Dr. Toniann Fail who agrees to admit.  CRITICAL CARE Performed by: Geoffery Lyons Total critical care time: 45 minutes Critical care time was  exclusive of separately billable procedures and treating other patients. Critical care was necessary to treat or prevent imminent or life-threatening deterioration. Critical care was time spent personally by me on the following activities: development of treatment plan with patient and/or surrogate as well as nursing, discussions with consultants, evaluation of patient's response to treatment, examination of patient, obtaining history from patient or surrogate, ordering and performing treatments and interventions, ordering and review of laboratory studies, ordering and review of radiographic studies, pulse oximetry and re-evaluation of patient's condition.   Final Clinical Impressions(s) / ED Diagnoses   Final diagnoses:  None    ED Discharge Orders    None       Geoffery Lyons, MD 04/27/18 [id number]    Geoffery Lyons, MD 04/27/18 [phone]

## 2018-04-27 NOTE — Progress Notes (Signed)
PT Cancellation Note  Patient Details Name: Anne Black MRN: [id number] DOB: Apr 08, 1960   Cancelled Treatment:    Reason Eval/Treat Not Completed: Patient declined, no reason specified   Rebeca Alert, PT Acute Rehabilitation Services Pager: [phone] Office: [phone]

## 2018-04-27 NOTE — Progress Notes (Signed)
Upon shift patient very needy, called out 6 times in 30 minutes. Patient complained of pain and asked for Tyneol. RN paged on call for Tyneol brought to patient she refused. Administered Zofran for nausea and ativan for withdrawal symptoms. Bought patient 2 Ginger Ale. Offered to play relaxation music for patient. RN has done everything for patient and patient has called out 4 times in the last hour. Secretary asked patient what can she do for her, patient stated she was lonely and sad. Secretary called chaplin to come talk with patient. Will continue to monitor.

## 2018-04-27 NOTE — ED Notes (Signed)
EDP Dr Judd Lien  Notified of lactic acid 2.5 and K+ 2.1

## 2018-04-28 DIAGNOSIS — F10221 Alcohol dependence with intoxication delirium: Secondary | ICD-10-CM | POA: Diagnosis present

## 2018-04-28 DIAGNOSIS — J9611 Chronic respiratory failure with hypoxia: Secondary | ICD-10-CM | POA: Diagnosis present

## 2018-04-28 DIAGNOSIS — F17210 Nicotine dependence, cigarettes, uncomplicated: Secondary | ICD-10-CM | POA: Diagnosis present

## 2018-04-28 DIAGNOSIS — R531 Weakness: Secondary | ICD-10-CM | POA: Diagnosis present

## 2018-04-28 DIAGNOSIS — F319 Bipolar disorder, unspecified: Secondary | ICD-10-CM | POA: Diagnosis present

## 2018-04-28 DIAGNOSIS — W19XXXA Unspecified fall, initial encounter: Secondary | ICD-10-CM | POA: Diagnosis present

## 2018-04-28 DIAGNOSIS — D696 Thrombocytopenia, unspecified: Secondary | ICD-10-CM | POA: Diagnosis present

## 2018-04-28 DIAGNOSIS — G43909 Migraine, unspecified, not intractable, without status migrainosus: Secondary | ICD-10-CM | POA: Diagnosis present

## 2018-04-28 DIAGNOSIS — F10231 Alcohol dependence with withdrawal delirium: Secondary | ICD-10-CM | POA: Diagnosis present

## 2018-04-28 DIAGNOSIS — Z7951 Long term (current) use of inhaled steroids: Secondary | ICD-10-CM | POA: Diagnosis not present

## 2018-04-28 DIAGNOSIS — Z820 Family history of epilepsy and other diseases of the nervous system: Secondary | ICD-10-CM | POA: Diagnosis not present

## 2018-04-28 DIAGNOSIS — L03311 Cellulitis of abdominal wall: Secondary | ICD-10-CM | POA: Diagnosis present

## 2018-04-28 DIAGNOSIS — I480 Paroxysmal atrial fibrillation: Secondary | ICD-10-CM | POA: Diagnosis present

## 2018-04-28 DIAGNOSIS — R627 Adult failure to thrive: Secondary | ICD-10-CM | POA: Diagnosis present

## 2018-04-28 DIAGNOSIS — Y907 Blood alcohol level of 200-239 mg/100 ml: Secondary | ICD-10-CM | POA: Diagnosis present

## 2018-04-28 DIAGNOSIS — G35 Multiple sclerosis: Secondary | ICD-10-CM | POA: Diagnosis present

## 2018-04-28 DIAGNOSIS — S0083XA Contusion of other part of head, initial encounter: Secondary | ICD-10-CM | POA: Diagnosis present

## 2018-04-28 DIAGNOSIS — J449 Chronic obstructive pulmonary disease, unspecified: Secondary | ICD-10-CM | POA: Diagnosis present

## 2018-04-28 DIAGNOSIS — F10931 Alcohol use, unspecified with withdrawal delirium: Secondary | ICD-10-CM | POA: Diagnosis present

## 2018-04-28 DIAGNOSIS — D509 Iron deficiency anemia, unspecified: Secondary | ICD-10-CM | POA: Diagnosis present

## 2018-04-28 DIAGNOSIS — F10239 Alcohol dependence with withdrawal, unspecified: Secondary | ICD-10-CM | POA: Diagnosis present

## 2018-04-28 DIAGNOSIS — Z8249 Family history of ischemic heart disease and other diseases of the circulatory system: Secondary | ICD-10-CM | POA: Diagnosis not present

## 2018-04-28 DIAGNOSIS — F419 Anxiety disorder, unspecified: Secondary | ICD-10-CM | POA: Diagnosis present

## 2018-04-28 DIAGNOSIS — E876 Hypokalemia: Secondary | ICD-10-CM | POA: Diagnosis present

## 2018-04-28 DIAGNOSIS — F909 Attention-deficit hyperactivity disorder, unspecified type: Secondary | ICD-10-CM | POA: Diagnosis present

## 2018-04-28 DIAGNOSIS — K219 Gastro-esophageal reflux disease without esophagitis: Secondary | ICD-10-CM | POA: Diagnosis present

## 2018-04-28 DIAGNOSIS — K7031 Alcoholic cirrhosis of liver with ascites: Secondary | ICD-10-CM | POA: Diagnosis present

## 2018-04-28 DIAGNOSIS — I471 Supraventricular tachycardia: Secondary | ICD-10-CM | POA: Diagnosis present

## 2018-04-28 LAB — BASIC METABOLIC PANEL
Anion gap: 9 (ref 5–15)
BUN: <5 mg/dL — ABNORMAL LOW (ref 6–20)
CO2: 35 mmol/L — ABNORMAL HIGH (ref 22–32)
Calcium: 8.1 mg/dL — ABNORMAL LOW (ref 8.9–10.3)
Chloride: 91 mmol/L — ABNORMAL LOW (ref 98–111)
Creatinine, Ser: 0.49 mg/dL (ref 0.44–1.00)
GFR calc non Af Amer: >60 mL/min (ref >60)
Glucose, Bld: 100 mg/dL — ABNORMAL HIGH (ref 70–99)
Potassium: 2.9 mmol/L — ABNORMAL LOW (ref 3.5–5.1)
Sodium: 135 mmol/L (ref 135–145)

## 2018-04-28 LAB — IRON AND TIBC
Iron: 23 ug/dL — ABNORMAL LOW (ref 28–170)
Saturation Ratios: 6 % — ABNORMAL LOW (ref 10.4–31.8)
TIBC: 366 ug/dL (ref 250–450)
UIBC: 343 ug/dL

## 2018-04-28 LAB — RETICULOCYTES
Immature Retic Fract: 36.1 % — ABNORMAL HIGH (ref 2.3–15.9)
RBC.: 3.73 MIL/uL — ABNORMAL LOW (ref 3.87–5.11)
Retic Count, Absolute: 129.8 10*3/uL (ref 19.0–186.0)
Retic Ct Pct: 3.5 % — ABNORMAL HIGH (ref 0.4–3.1)

## 2018-04-28 LAB — FERRITIN: Ferritin: 16 ng/mL (ref 11–307)

## 2018-04-28 LAB — FOLATE: Folate: 14.4 ng/mL (ref >5.9)

## 2018-04-28 LAB — VITAMIN B12: Vitamin B-12: 474 pg/mL (ref 180–914)

## 2018-04-28 LAB — MAGNESIUM: Magnesium: 2 mg/dL (ref 1.7–2.4)

## 2018-04-28 MED ORDER — MAGNESIUM OXIDE 400 (241.3 MG) MG PO TABS
400.0000 mg | ORAL_TABLET | Freq: Two times a day (BID) | ORAL | Status: DC
  Administered 2018-04-28 – 2018-04-29 (×3): 400 mg via ORAL
  Filled 2018-04-28 (×3): qty 1

## 2018-04-28 MED ORDER — LOPERAMIDE HCL 2 MG PO CAPS
2.0000 mg | ORAL_CAPSULE | Freq: Once | ORAL | Status: AC
  Administered 2018-04-28: 2 mg via ORAL
  Filled 2018-04-28: qty 1

## 2018-04-28 MED ORDER — LOPERAMIDE HCL 2 MG PO CAPS
2.0000 mg | ORAL_CAPSULE | Freq: Four times a day (QID) | ORAL | Status: DC | PRN
  Administered 2018-04-28 (×2): 2 mg via ORAL
  Filled 2018-04-28 (×2): qty 1

## 2018-04-28 MED ORDER — POTASSIUM CHLORIDE CRYS ER 20 MEQ PO TBCR
40.0000 meq | EXTENDED_RELEASE_TABLET | Freq: Two times a day (BID) | ORAL | Status: DC
  Administered 2018-04-28 – 2018-04-29 (×3): 40 meq via ORAL
  Filled 2018-04-28 (×3): qty 2

## 2018-04-28 NOTE — Progress Notes (Signed)
Chaplain following due to RN consult for sadness / loneliness.   This chaplain familiar with Ms Aseltine from previous admission.  Chaplain introduced spiritual care as resource.  Ms Hanover was sleeping when Chaplain arrived.  Awoke easily.  Expressed appreciation for chaplain presence.  Stated she had a "rough night" last night and requested chaplain follow up at later time.  This chaplain will continue to follow for support and attempt to contact Ms Dewolf at a later time.      Burnis Kingfisher, MDiv, [facility]

## 2018-04-28 NOTE — Progress Notes (Addendum)
PROGRESS NOTE    Anne Black  FBP:[id number] DOB: September 10, 1960 DOA: 04/27/2018 PCP: Patient, No Pcp Per    Brief Narrative:  58 year old female with history of alcoholism, hepatic cirrhosis with ascites and paroxysmal A. fib, chronic hypokalemia and bipolar disorder admitted to the hospital secondary to weakness as per patient.  EMS found patient living in a large home that is nearly uninhabitable due to insect infestation and swelling of furniture by feces.  Patient was also complaining of abdominal pain.  In the emergency room, she was tachycardic.  Potassium was 2.1, magnesium was 1.5.  She was admitted to the hospital with severe electrolyte abnormalities and alcohol withdrawal.   Assessment & Plan:   Principal Problem:   Weakness generalized Active Problems:   Bipolar 1 disorder (HCC)   Tobacco abuse   Anxiety   Hypokalemia   Alcohol use disorder, severe, dependence (HCC)   Alcoholic cirrhosis of liver with ascites (HCC)   Multiple sclerosis (HCC)   Hypomagnesemia   Abdominal wall cellulitis   COPD (chronic obstructive pulmonary disease) (HCC)   Alcohol abuse with intoxication (HCC)   Atrial fibrillation with rapid ventricular response (HCC)   Chronic hypoxemic respiratory failure (HCC)   Alcohol withdrawal delirium (HCC)  Alcoholism with alcohol withdrawal: High risk of delirium tremens. Continue scheduled dose of Librium and as needed benzodiazepine.  Fall precautions.  Seizure precautions.  On multivitamins.  Electrolyte replacement.  Continue counseling.  Hypokalemia: Persistent severe hypokalemia.  Will replace aggressively.  Keep on a schedule replacement.  Continue to recheck levels to ensure stabilization.  Will check magnesium in the morning.  Weakness, failure to thrive, deconditioning: Complicated by ongoing alcohol use.  She will work with PT OT when able to stabilize.  Paroxysmal atrial fibrillation and frequent SVT: Resume diltiazem.  Her rate is controlled.   She is currently sinus rhythm.  TSH is normal.  Given alcoholism, patient is unreliable to take medicine.  Will not treat with therapeutic anticoagulation.  Hepatic cirrhosis with moderate ascites and esophageal varices: Currently no active evidence of infection.  She is on Aldactone that we will continue.  Holding Lasix because of very low potassium levels. On prophylactic Rocephin until clinical improvement.  There is no enough fluid for paracentesis.  Smoker: Cessation counseling.  Bipolar disorder, alcoholism: Will resume gabapentin and fluoxetine that she was taking at home.  This patient has severe symptomatic disease including severe hypokalemia, alcohol withdrawal and needing continuous monitoring and treatment in the hospital.  Anticipate she will need move more than 2 midnights in the hospital.  Will change to inpatient.  DVT prophylaxis: Lovenox. Code Status: Full code. Family Communication: No family at bedside. Disposition Plan: Skilled nursing facility.   Consultants:   None.  Procedures:   None.  Antimicrobials:   Rocephin 1 g, 04/27/2018 ongoing.   Subjective: Patient was seen and examined.  No overnight events.  Patient complains of some nausea.  Denies any other complaints.  No seizures.  No abdominal pain.  She wanted me to refill all her medications in the hospital.  Patient wanted to use some Imodium for diarrhea that she has 2-3 episodes in a day.  Objective: Vitals:   04/27/18 2008 04/27/18 2107 04/28/18 0612 04/28/18 0746  BP:  139/69    Pulse:  99    Resp:  20    Temp:  98 F (36.7 C)    TempSrc:      SpO2: 93% 98%  95%  Weight:   79.7 kg  Height:        Intake/Output Summary (Last 24 hours) at 04/28/2018 1007 Last data filed at 04/27/2018 1807 Gross per 24 hour  Intake 360 ml  Output --  Net 360 ml   Filed Weights   04/27/18 0356 04/28/18 0612  Weight: 83 kg 79.7 kg    Examination:  General exam: Appears calm and comfortable ,  tangential thought.  Slightly anxious.  Mild tremors. Respiratory system: Clear to auscultation. Respiratory effort normal. Cardiovascular system: S1 & S2 heard, RRR. No JVD, murmurs, rubs, gallops or clicks. No pedal edema. Gastrointestinal system: Abdomen is obese and distended but nontender.  No organomegaly or masses felt. Normal bowel sounds heard.  Midline surgical scar is dry and nontender. Central nervous system: Alert and oriented. No focal neurological deficits.  Patient is alert and oriented, repeating same words again and again. Extremities: Symmetric 5 x 5 power. Skin: No rashes, lesions or ulcers Psychiatry: Judgement and insight appear abnormal. Mood & affect anxious and flat affect.    Data Reviewed: I have personally reviewed following labs and imaging studies  CBC: Recent Labs  Lab 04/24/18 0439 04/27/18 0513  WBC 6.4 4.4  NEUTROABS 4.5 2.8  HGB 9.4* 10.7*  HCT 33.9* 37.3  MCV 82.5 80.6  PLT 93* 154   Basic Metabolic Panel: Recent Labs  Lab 04/24/18 0439 04/27/18 0513 04/27/18 1603 04/28/18 0707  NA 136 140 135 135  K 3.6 2.1* 2.7* 2.9*  CL 93* 92* 90* 91*  CO2 33* 33* 33* 35*  GLUCOSE 106* 100* 99 100*  BUN 5* <5* <5* <5*  CREATININE 0.44 0.42* 0.43* 0.49  CALCIUM 8.0* 7.7* 7.6* 8.1*  MG  --  1.5*  --  2.0  PHOS  --  3.5  --   --    GFR: Estimated Creatinine Clearance: 74.9 mL/min (by C-G formula based on SCr of 0.49 mg/dL). Liver Function Tests: Recent Labs  Lab 04/24/18 0439 04/27/18 0513  AST 23 27  ALT 16 17  ALKPHOS 97 112  BILITOT 1.8* 1.4*  PROT 6.4* 6.7  ALBUMIN 3.3* 3.3*   Recent Labs  Lab 04/24/18 0439 04/27/18 0513  LIPASE 29 28   Recent Labs  Lab 04/27/18 0513  AMMONIA 22   Coagulation Profile: No results for input(s): INR, PROTIME in the last 168 hours. Cardiac Enzymes: Recent Labs  Lab 04/24/18 0439 04/27/18 0513  CKTOTAL 105  --   TROPONINI  --  <0.03   BNP (last 3 results) No results for input(s): PROBNP  in the last 8760 hours. HbA1C: No results for input(s): HGBA1C in the last 72 hours. CBG: No results for input(s): GLUCAP in the last 168 hours. Lipid Profile: No results for input(s): CHOL, HDL, LDLCALC, TRIG, CHOLHDL, LDLDIRECT in the last 72 hours. Thyroid Function Tests: No results for input(s): TSH, T4TOTAL, FREET4, T3FREE, THYROIDAB in the last 72 hours. Anemia Panel: Recent Labs    04/28/18 0707  VITAMINB12 474  FOLATE 14.4  FERRITIN 16  TIBC 366  IRON 23*  RETICCTPCT 3.5*   Sepsis Labs: Recent Labs  Lab 04/27/18 0513 04/27/18 0904  LATICACIDVEN 2.5* 2.7*    No results found for this or any previous visit (from the past 240 hour(s)).       Radiology Studies: Ct Abdomen Pelvis W Contrast  Result Date: 04/27/2018 CLINICAL DATA:  Acute generalized abdominal pain EXAM: CT ABDOMEN AND PELVIS WITH CONTRAST TECHNIQUE: Multidetector CT imaging of the abdomen and pelvis was performed using the standard  protocol following bolus administration of intravenous contrast. CONTRAST:  ISOVUE-300 IOPAMIDOL (ISOVUE-300) INJECTION 61% COMPARISON:  02/28/2018 FINDINGS: Lower chest: Partially covered bilateral breast implants. Esophageal varices. Hepatobiliary: Steatotic, small and lobulated liver. Recanalized periumbilical vein. Moderate ascites without loculation. Patent main portal vein. No masslike finding in the liver-a wispy area of hypervascularity in the lateral segment left liver has been stable since 2018.No evidence of biliary obstruction or stone. Pancreas: Unremarkable. Spleen: Mild enlargement in the setting of portal hypertension. No acute or focal finding Adrenals/Urinary Tract: Negative adrenals. No hydronephrosis or stone. Unremarkable bladder. Stomach/Bowel: No obstruction. No appendicitis. Mild, stable submucosal thickening of the proximal colon which is likely from portal hypertension. Vascular/Lymphatic: Periesophageal varices. Mild atherosclerotic calcification. No  acute vascular finding. No mass or adenopathy. Reproductive:Negative. Other: No pneumoperitoneum Musculoskeletal: No acute finding. Fluid is present within the right ileus psoas bursa. IMPRESSION: 1. No acute finding compared to priors. 2. Cirrhosis with portal hypertension, esophageal varices, and moderate ascites. Electronically Signed   By: Marnee Spring M.D.   On: 04/27/2018 07:36        Scheduled Meds:  chlordiazePOXIDE  25 mg Oral TID   Followed by   Melene Muller ON 04/29/2018] chlordiazePOXIDE  10 mg Oral TID   Followed by   Melene Muller ON 04/30/2018] chlordiazePOXIDE  5 mg Oral TID   Followed by   Melene Muller ON 05/01/2018] chlordiazePOXIDE  5 mg Oral TID   diltiazem  60 mg Oral Q8H   enoxaparin (LOVENOX) injection  40 mg Subcutaneous Q24H   feeding supplement (ENSURE ENLIVE)  237 mL Oral TID BM   FLUoxetine  20 mg Oral Daily   folic acid  1 mg Oral Daily   gabapentin  200 mg Oral TID   magnesium oxide  400 mg Oral BID   mometasone-formoterol  2 puff Inhalation BID   multivitamin with minerals  1 tablet Oral Daily   pantoprazole  40 mg Oral Daily   potassium chloride  40 mEq Oral BID   sodium chloride flush  3 mL Intravenous Q12H   spironolactone  25 mg Oral Daily   thiamine  100 mg Oral Daily   Or   thiamine  100 mg Intravenous Daily   Continuous Infusions:  cefTRIAXone (ROCEPHIN)  IV 1 g (04/28/18 0925)     LOS: 0 days    Time spent: 27    Dorcas Carrow, MD [facility] Pager [phone]  If 7PM-7AM, please contact night-coverage [email or web address] Password [email or web address] 04/28/2018, 10:07 AM

## 2018-04-28 NOTE — Progress Notes (Signed)
OT Cancellation Note  Patient Details Name: Anne Black MRN: [id number] DOB: 07-21-1960   Cancelled Treatment:    Reason Eval/Treat Not Completed: Fatigue/lethargy limiting ability to participate;Other (comment)  Cx x 2 this am.  Was on phone and asked for 20 minutes then too tired to mobilize at all. We will check back over the weekend as schedule permits.  Bricia Taher 04/28/2018, 10:06 AM  Marica Otter, OTR/L Acute Rehabilitation Services [phone] [facility] pager [phone] office 04/28/2018

## 2018-04-28 NOTE — Progress Notes (Signed)
PT Cancellation Note  Patient Details Name: SHALAE LAMKE MRN: [id number] DOB: 05-11-1960   Cancelled Treatment:    Reason Eval/Treat Not Completed: 2 attempts to evaluate pt on today-pt declined participation. Will check back another day. If pt continues to refuse, will sign off. Thanks.    Rebeca Alert, PT Acute Rehabilitation Services Pager: [phone] Office: [phone]

## 2018-04-29 LAB — CBC WITH DIFFERENTIAL/PLATELET
Abs Immature Granulocytes: 0.01 10*3/uL (ref 0.00–0.07)
Basophils Absolute: 0 10*3/uL (ref 0.0–0.1)
Basophils Relative: 1 %
Eosinophils Absolute: 0.1 10*3/uL (ref 0.0–0.5)
Eosinophils Relative: 2 %
HEMATOCRIT: 30.7 % — AB (ref 36.0–46.0)
Hemoglobin: 8.1 g/dL — ABNORMAL LOW (ref 12.0–15.0)
Immature Granulocytes: 0 %
Lymphocytes Relative: 14 %
Lymphs Abs: 0.5 10*3/uL — ABNORMAL LOW (ref 0.7–4.0)
MCH: 22.5 pg — ABNORMAL LOW (ref 26.0–34.0)
MCHC: 26.4 g/dL — ABNORMAL LOW (ref 30.0–36.0)
MCV: 85.3 fL (ref 80.0–100.0)
Monocytes Absolute: 0.5 10*3/uL (ref 0.1–1.0)
Monocytes Relative: 16 %
Neutro Abs: 2.3 10*3/uL (ref 1.7–7.7)
Neutrophils Relative %: 67 %
Platelets: 150 10*3/uL (ref 150–400)
RBC: 3.6 MIL/uL — ABNORMAL LOW (ref 3.87–5.11)
RDW: 20.9 % — AB (ref 11.5–15.5)
WBC: 3.4 10*3/uL — ABNORMAL LOW (ref 4.0–10.5)
nRBC: 0 % (ref 0.0–0.2)

## 2018-04-29 LAB — BASIC METABOLIC PANEL
Anion gap: 9 (ref 5–15)
BUN: <5 mg/dL — ABNORMAL LOW (ref 6–20)
CO2: 32 mmol/L (ref 22–32)
Calcium: 8 mg/dL — ABNORMAL LOW (ref 8.9–10.3)
Chloride: 94 mmol/L — ABNORMAL LOW (ref 98–111)
Creatinine, Ser: 0.44 mg/dL (ref 0.44–1.00)
GFR calc Af Amer: >60 mL/min (ref >60)
GFR calc non Af Amer: >60 mL/min (ref >60)
Glucose, Bld: 94 mg/dL (ref 70–99)
Potassium: 3.6 mmol/L (ref 3.5–5.1)
Sodium: 135 mmol/L (ref 135–145)

## 2018-04-29 LAB — PHOSPHORUS: Phosphorus: 3.1 mg/dL (ref 2.5–4.6)

## 2018-04-29 LAB — MAGNESIUM: Magnesium: 2 mg/dL (ref 1.7–2.4)

## 2018-04-29 MED ORDER — METOCLOPRAMIDE HCL 5 MG/ML IJ SOLN: 10.0000 mg | Freq: Four times a day (QID) | INTRAMUSCULAR | Status: DC | PRN

## 2018-04-29 MED ORDER — ONDANSETRON HCL 4 MG/2ML IJ SOLN: 4.0000 mg | Freq: Four times a day (QID) | INTRAMUSCULAR | Status: DC | PRN

## 2018-04-29 NOTE — Discharge Summary (Signed)
Physician Discharge Summary  Anne Black HWE:[id number] DOB: [birth year] DOA: 04/27/2018  PCP: Patient, No Pcp Per  Admit date: 04/27/2018 Discharge date: 04/29/2018  Admitted From: Home. Disposition: Home.  Recommendations for Outpatient Follow-up:  1. Follow up with PCP in 1-2 weeks   Home Health: Not applicable Equipment/Devices: None  Discharge Condition: Stable CODE STATUS: Full code Diet recommendation:  Regular diet Brief/Interim Summary:58 year old female with history of alcoholism, hepatic cirrhosis with ascites and paroxysmal A. fib, chronic hypokalemia and bipolar disorder admitted to the hospital secondary to weakness as per patient.  EMS found patient living in a large home that is nearly uninhabitable due to insect infestation and swelling of furniture by feces.  Patient was also complaining of abdominal pain.  In the emergency room, she was tachycardic.  Potassium was 2.1, magnesium was 1.5.  She was admitted to the hospital with electrolyte abnormalities and alcohol withdrawal.   Discharge Diagnoses:  Principal Problem:   Weakness generalized Active Problems:   Bipolar 1 disorder (HCC)   Tobacco abuse   Anxiety   Hypokalemia   Alcohol use disorder, severe, dependence (HCC)   Alcoholic cirrhosis of liver with ascites (HCC)   Multiple sclerosis (HCC)   Hypomagnesemia   Abdominal wall cellulitis   COPD (chronic obstructive pulmonary disease) (HCC)   Alcohol abuse with intoxication (HCC)   Atrial fibrillation with rapid ventricular response (HCC)   Chronic hypoxemic respiratory failure (HCC)   Alcohol withdrawal delirium (HCC)  Alcoholism with risk of alcohol withdrawal:  Treated with benzodiazepines.  Currently remains a stable.  She has no evidence of delirium tremens.  She does not want to continue treatment in the hospital.  Patient is alert oriented, no hallucinations or delusions.  Patient is able to make her clinical decisions.  Today, she is asking me to  discharge her.  She wants to go home and then go to check on few rehab places at [place] and [address] as well as [place]. Since patient is medically stable and willing to be discharged, she is discharged.  Hypokalemia: Aggressively replaced and normalized.  She is on Aldactone and potassium replacement along with Lasix.  Paroxysmal atrial fibrillation and frequent SVT:  She is currently sinus rhythm. TSH is normal.  Given alcoholism, patient is unreliable to take medicine.  Will not treat with therapeutic anticoagulation.  Hepatic cirrhosis with moderate ascites and esophageal varices: Currently no active evidence of infection.  She is on Aldactone that we will continue. Will resume lasix with potassium on discharge.  No evidence of SBP.  Smoker: Cessation counseling.  Bipolar disorder, alcoholism: Will resume gabapentin and fluoxetine that she was taking at home.  Patient was seen by Child psychotherapist.  We were exploring opportunities for her to go to rehab from the hospital.  However, patient is asking to be discharged and she would like to go home, back up and check herself into rehab.  Discharged in a stable condition.    Discharge Instructions  Discharge Instructions    Diet - low sodium heart healthy   Complete by:  As directed    Discharge instructions   Complete by:  As directed    Follow up with your primary care physician  Follow up with alcohol rehab as you aware   Increase activity slowly   Complete by:  As directed      Allergies as of 04/29/2018   No Known Allergies     Medication List    STOP taking these medications   ondansetron  4 MG disintegrating tablet Commonly known as:  ZOFRAN-ODT     TAKE these medications   acetaminophen 500 MG tablet Commonly known as:  TYLENOL Take 1 tablet (500 mg total) by mouth every 6 (six) hours as needed for mild pain. What changed:  how much to take   albuterol 108 (90 Base) MCG/ACT inhaler Commonly known as:   PROVENTIL HFA;VENTOLIN HFA Inhale 2 puffs into the lungs every 4 (four) hours as needed for wheezing or shortness of breath.   aluminum-magnesium hydroxide-simethicone 200-200-20 MG/5ML Susp Commonly known as:  MAALOX Take 30 mLs by mouth 4 (four) times daily as needed (indigestion).   chlordiazePOXIDE 25 MG capsule Commonly known as:  LIBRIUM  PO TID x 1D, then 25-50mg  PO BID X 1D, then 25-50mg  PO QD X 1D What changed:    how much to take  how to take this  when to take this  reasons to take this  additional instructions   diltiazem 120 MG 24 hr capsule Commonly known as:  CARDIZEM CD Take 1 capsule (120 mg total) by mouth daily for 30 days.   FLUoxetine 20 MG capsule Commonly known as:  PROZAC Take 1 capsule (20 mg total) by mouth daily for 30 doses.   furosemide 40 MG tablet Commonly known as:  LASIX Take 40 mg twice daily till 04/02/2018, after that take 40 mg daily. What changed:    how much to take  how to take this  when to take this   gabapentin 100 MG capsule Commonly known as:  NEURONTIN Take 2 capsules (200 mg total) by mouth 3 (three) times daily for 30 days.   hydrOXYzine 25 MG tablet Commonly known as:  ATARAX/VISTARIL Take 1 tablet (25 mg total) by mouth daily as needed for up to 30 doses (anxiety). What changed:  reasons to take this   ipratropium-albuterol 0.5-2.5 (3) MG/3ML Soln Commonly known as:  DUONEB Take 3 mLs by nebulization 2 (two) times daily. AND every 4 hours as needed for wheezing/shortness of breath   lactulose 10 GM/15ML solution Commonly known as:  CHRONULAC Take 45 mLs (30 g total) by mouth 3 (three) times daily for 30 days.   lidocaine 5 % Commonly known as:  Lidoderm Place 1 patch onto the skin daily. Remove & Discard patch within 12 hours or as directed by MD   magnesium oxide 400 MG tablet Commonly known as:  MAG-OX Take 400 mg by mouth daily.   pantoprazole 40 MG tablet Commonly known as:  PROTONIX Take 1  tablet (40 mg total) by mouth daily for 30 days.   potassium chloride SA 20 MEQ tablet Commonly known as:  K-DUR,KLOR-CON Take 2 tablets (40 mEq total) by mouth 2 (two) times daily.   rizatriptan 5 MG tablet Commonly known as:  MAXALT Take 5 mg by mouth as needed for migraine. May repeat in 2 hours if needed; not to exceed  in 24 hours   spironolactone 25 MG tablet Commonly known as:  ALDACTONE Take 1 tablet (25 mg total) by mouth daily for 30 days.   Wixela Inhub 250-50 MCG/DOSE Aepb Generic drug:  Fluticasone-Salmeterol Inhale 2 puffs into the lungs 2 (two) times daily.       No Known Allergies  Consultations:  None.   Procedures/Studies: Dg Chest 2 View  Result Date: 04/14/2018 CLINICAL DATA:  Hypoxia, abnormal breath sounds EXAM: CHEST - 2 VIEW COMPARISON:  Chest CT 04/11/2018 FINDINGS: Mild cardiomegaly. No confluent opacities, effusions or edema. No acute  bony abnormality. IMPRESSION: Cardiomegaly.  No active disease. Electronically Signed   By: Charlett Nose M.D.   On: 04/14/2018 09:56   Dg Chest 2 View  Result Date: 04/11/2018 CLINICAL DATA:  58 year old female with history of alcohol abuse presenting with abdominal pain and nausea. EXAM: CHEST - 2 VIEW COMPARISON:  CT the abdomen and pelvis 04/09/2018. FINDINGS: Lung volumes are low. No consolidative airspace disease. No pleural effusions. No suspicious appearing pulmonary nodules or masses. No pneumothorax. No evidence of pulmonary edema. Heart size is mildly enlarged. Upper mediastinal contours are within normal limits. IMPRESSION: 1. No radiographic evidence of acute cardiopulmonary disease. 2. Mild cardiomegaly. Electronically Signed   By: Trudie Reed M.D.   On: 04/11/2018 09:39   Dg Thoracic Spine 2 View  Result Date: 04/17/2018 CLINICAL DATA:  Pain after fall. EXAM: THORACIC SPINE 2 VIEWS COMPARISON:  04/14/2018 CXR FINDINGS: There is no evidence of thoracic spine fracture. Alignment is normal. Mild  multilevel disc space narrowing and minimal endplate spurring is seen. No other significant bone abnormalities are identified. IMPRESSION: Mild degenerative change along the thoracic spine without acute osseous appearing abnormality. Electronically Signed   By: Tollie Eth M.D.   On: 04/17/2018 00:55   Ct Head Wo Contrast  Result Date: 04/24/2018 CLINICAL DATA:  Head trauma. Neck and back pain. EXAM: CT HEAD WITHOUT CONTRAST CT CERVICAL SPINE WITHOUT CONTRAST TECHNIQUE: Multidetector CT imaging of the head and cervical spine was performed following the standard protocol without intravenous contrast. Multiplanar CT image reconstructions of the cervical spine were also generated. COMPARISON:  Head CT 04/18/2018 FINDINGS: CT HEAD FINDINGS Brain: There is no mass, hemorrhage or extra-axial collection. The size and configuration of the ventricles and extra-axial CSF spaces are normal. There is hypoattenuation of the periventricular white matter, most commonly indicating chronic ischemic microangiopathy. Vascular: No abnormal hyperdensity of the major intracranial arteries or dural venous sinuses. No intracranial atherosclerosis. Skull: The visualized skull base, calvarium and extracranial soft tissues are normal. Sinuses/Orbits: No fluid levels or advanced mucosal thickening of the visualized paranasal sinuses. No mastoid or middle ear effusion. The orbits are normal. CT CERVICAL SPINE FINDINGS Alignment: No static subluxation. Facets are aligned. Occipital condyles are normally positioned. Skull base and vertebrae: No acute fracture. Soft tissues and spinal canal: No prevertebral fluid or swelling. No visible canal hematoma. Disc levels: No advanced spinal canal or neural foraminal stenosis. Upper chest: No pneumothorax, pulmonary nodule or pleural effusion. Other: Normal visualized paraspinal cervical soft tissues. IMPRESSION: 1. No acute intracranial abnormality. 2. No acute fracture or static subluxation of the  cervical spine. Electronically Signed   By: Deatra Robinson M.D.   On: 04/24/2018 05:23   Ct Head Wo Contrast  Result Date: 04/18/2018 CLINICAL DATA:  Altered mental status with recent fall. EXAM: CT HEAD WITHOUT CONTRAST TECHNIQUE: Contiguous axial images were obtained from the base of the skull through the vertex without intravenous contrast. COMPARISON:  April 14, 2018 FINDINGS: Brain: There is mild diffuse atrophy. There is no intracranial mass, hemorrhage, extra-axial fluid collection, or midline shift. There is patchy small vessel disease throughout portions of the centra semiovale bilaterally, stable. No acute appearing infarct is evident. Vascular: No hyperdense vessel. Foci of calcification are noted in each carotid siphon region. Skull: The bony calvarium appears intact. Sinuses/Orbits: There is a retention cyst in the right maxillary antrum. There is mild mucosal thickening in anterior ethmoid air cells. Other visualized paranasal sinuses are clear. Orbits appear symmetric bilaterally. Other: Mastoids bilaterally are  somewhat hypoplastic but clear. IMPRESSION: Stable atrophy with patchy periventricular small vessel disease. No acute infarct is demonstrable. No mass or hemorrhage. Foci of arterial vascular calcification noted. Areas of mild paranasal sinus disease noted. Electronically Signed   By: Bretta Bang III M.D.   On: 04/18/2018 13:19   Ct Head Wo Contrast  Result Date: 04/14/2018 CLINICAL DATA:  Headache, fall EXAM: CT HEAD WITHOUT CONTRAST TECHNIQUE: Contiguous axial images were obtained from the base of the skull through the vertex without intravenous contrast. COMPARISON:  03/23/2018 FINDINGS: Brain: Chronic small vessel disease changes throughout the deep white matter, stable. No acute intracranial abnormality. Specifically, no hemorrhage, hydrocephalus, mass lesion, acute infarction, or significant intracranial injury. Vascular: No hyperdense vessel or unexpected calcification.  Skull: No acute calvarial abnormality. Sinuses/Orbits: No acute finding. Other: None IMPRESSION: Chronic small vessel disease.  No acute intracranial abnormality. Electronically Signed   By: Charlett Nose M.D.   On: 04/14/2018 10:17   Ct Angio Chest Pe W And/or Wo Contrast  Result Date: 04/11/2018 CLINICAL DATA:  Difficulty breathing EXAM: CT ANGIOGRAPHY CHEST WITH CONTRAST TECHNIQUE: Multidetector CT imaging of the chest was performed using the standard protocol during bolus administration of intravenous contrast. Multiplanar CT image reconstructions and MIPs were obtained to evaluate the vascular anatomy. CONTRAST:  OMNIPAQUE IOHEXOL 350 MG/ML SOLN COMPARISON:  02/28/2018 FINDINGS: Cardiovascular: Thoracic aorta demonstrates a normal branching pattern. No aneurysmal dilatation or dissection is seen. Mild cardiac enlargement is noted. Pulmonary artery shows a normal branching pattern without intraluminal filling defect to suggest pulmonary embolism. Mediastinum/Nodes: Thoracic inlet is within normal limits. No sizable hilar or mediastinal adenopathy is noted. The esophagus as visualized is within normal limits. Lungs/Pleura: The lungs are well aerated bilaterally. No focal infiltrate or sizable effusion is seen. No parenchymal nodules are noted. Upper Abdomen: Cirrhotic change of the liver is noted with evidence of perihepatic ascites slightly increased when compared with the prior exam. Previously seen varices are again identified but not opacified due to the timing of the contrast bolus. The remainder of the upper abdomen is within normal limits. Musculoskeletal: Bilateral breast implants are noted. Degenerative changes of the thoracic spine are seen. Healed rib fractures are noted bilaterally. Review of the MIP images confirms the above findings. IMPRESSION: No evidence of pulmonary emboli. Cirrhotic change of the liver with slight increase in ascites when compared with the prior exam. Electronically  Signed   By: Alcide Clever M.D.   On: 04/11/2018 14:15   Ct Cervical Spine Wo Contrast  Result Date: 04/24/2018 CLINICAL DATA:  Head trauma. Neck and back pain. EXAM: CT HEAD WITHOUT CONTRAST CT CERVICAL SPINE WITHOUT CONTRAST TECHNIQUE: Multidetector CT imaging of the head and cervical spine was performed following the standard protocol without intravenous contrast. Multiplanar CT image reconstructions of the cervical spine were also generated. COMPARISON:  Head CT 04/18/2018 FINDINGS: CT HEAD FINDINGS Brain: There is no mass, hemorrhage or extra-axial collection. The size and configuration of the ventricles and extra-axial CSF spaces are normal. There is hypoattenuation of the periventricular white matter, most commonly indicating chronic ischemic microangiopathy. Vascular: No abnormal hyperdensity of the major intracranial arteries or dural venous sinuses. No intracranial atherosclerosis. Skull: The visualized skull base, calvarium and extracranial soft tissues are normal. Sinuses/Orbits: No fluid levels or advanced mucosal thickening of the visualized paranasal sinuses. No mastoid or middle ear effusion. The orbits are normal. CT CERVICAL SPINE FINDINGS Alignment: No static subluxation. Facets are aligned. Occipital condyles are normally positioned. Skull base and  vertebrae: No acute fracture. Soft tissues and spinal canal: No prevertebral fluid or swelling. No visible canal hematoma. Disc levels: No advanced spinal canal or neural foraminal stenosis. Upper chest: No pneumothorax, pulmonary nodule or pleural effusion. Other: Normal visualized paraspinal cervical soft tissues. IMPRESSION: 1. No acute intracranial abnormality. 2. No acute fracture or static subluxation of the cervical spine. Electronically Signed   By: Deatra Robinson M.D.   On: 04/24/2018 05:23   Ct Abdomen Pelvis W Contrast  Result Date: 04/27/2018 CLINICAL DATA:  Acute generalized abdominal pain EXAM: CT ABDOMEN AND PELVIS WITH CONTRAST  TECHNIQUE: Multidetector CT imaging of the abdomen and pelvis was performed using the standard protocol following bolus administration of intravenous contrast. CONTRAST:  ISOVUE-300 IOPAMIDOL (ISOVUE-300) INJECTION 61% COMPARISON:  02/28/2018 FINDINGS: Lower chest: Partially covered bilateral breast implants. Esophageal varices. Hepatobiliary: Steatotic, small and lobulated liver. Recanalized periumbilical vein. Moderate ascites without loculation. Patent main portal vein. No masslike finding in the liver-a wispy area of hypervascularity in the lateral segment left liver has been stable since 2018.No evidence of biliary obstruction or stone. Pancreas: Unremarkable. Spleen: Mild enlargement in the setting of portal hypertension. No acute or focal finding Adrenals/Urinary Tract: Negative adrenals. No hydronephrosis or stone. Unremarkable bladder. Stomach/Bowel: No obstruction. No appendicitis. Mild, stable submucosal thickening of the proximal colon which is likely from portal hypertension. Vascular/Lymphatic: Periesophageal varices. Mild atherosclerotic calcification. No acute vascular finding. No mass or adenopathy. Reproductive:Negative. Other: No pneumoperitoneum Musculoskeletal: No acute finding. Fluid is present within the right ileus psoas bursa. IMPRESSION: 1. No acute finding compared to priors. 2. Cirrhosis with portal hypertension, esophageal varices, and moderate ascites. Electronically Signed   By: Marnee Spring M.D.   On: 04/27/2018 07:36   Dg Chest Port 1 View  Result Date: 04/24/2018 CLINICAL DATA:  Shortness of breath. EXAM: PORTABLE CHEST 1 VIEW COMPARISON:  CT chest 04/11/2018.  PA and lateral chest 01/25/2018. FINDINGS: The lungs are clear. Heart size is upper normal. No pneumothorax or pleural fluid. No acute or focal bony abnormality. IMPRESSION: No acute disease. Electronically Signed   By: Drusilla Kanner M.D.   On: 04/24/2018 11:07   Dg Chest Port 1 View  Result Date:  04/18/2018 CLINICAL DATA:  Shortness of breath, hypoxia, CHF. EXAM: PORTABLE CHEST 1 VIEW COMPARISON:  04/16/2018 FINDINGS: The cardiac silhouette remains enlarged. Aortic atherosclerosis is noted. The lungs are mildly hypoinflated with mild central pulmonary vascular congestion. No overt edema, airspace consolidation, sizeable pleural effusion, or pneumothorax is identified. No acute osseous abnormality is seen. IMPRESSION: Cardiomegaly and mild hypoinflation with mild pulmonary vascular congestion. Electronically Signed   By: Sebastian Ache M.D.   On: 04/18/2018 13:28   Dg Chest Portable 1 View  Result Date: 04/16/2018 CLINICAL DATA:  Chest pain EXAM: PORTABLE CHEST 1 VIEW COMPARISON:  04/14/2018 FINDINGS: Stable cardiomegaly. Minimal aortic atherosclerosis without aneurysm. No acute pulmonary consolidation, effusion or pneumothorax. Minimal left basilar atelectasis. No acute nor suspicious osseous abnormalities. IMPRESSION: Stable cardiomegaly without active pulmonary disease. Electronically Signed   By: Tollie Eth M.D.   On: 04/16/2018 22:30   Dg Chest Portable 1 View  Result Date: 04/09/2018 CLINICAL DATA:  Initial evaluation for COPD. EXAM: PORTABLE CHEST 1 VIEW COMPARISON:  Prior radiograph from 03/23/2018 FINDINGS: Patient is markedly rotated to the right. Cardiomegaly grossly stable. Mediastinal silhouette within normal limits. Aortic atherosclerosis. Lungs hypoinflated. Underlying changes related to COPD. No focal infiltrates. No superimposed edema or pleural effusion. No pneumothorax. No acute osseous finding. IMPRESSION: 1. COPD.  No other superimposed active cardiopulmonary disease. 2. Stable mild cardiomegaly with aortic atherosclerosis. Electronically Signed   By: Rise Mu M.D.   On: 04/09/2018 04:33   Dg Knee Complete 4 Views Left  Result Date: 04/14/2018 CLINICAL DATA:  LEFT knee pain post fall EXAM: LEFT KNEE - COMPLETE 4+ VIEW COMPARISON:  04/09/2018 FINDINGS: Osseous  mineralization appears diffusely decreased. Joint spaces preserved. No acute fracture, dislocation, or bone destruction. No knee joint effusion. IMPRESSION: No acute osseous abnormalities. Electronically Signed   By: Ulyses Southward M.D.   On: 04/14/2018 08:34   Dg Knee Left Port  Result Date: 04/09/2018 CLINICAL DATA:  58 year old female with left knee pain. EXAM: PORTABLE LEFT KNEE - 1-2 VIEW COMPARISON:  None. FINDINGS: There is no acute fracture or dislocation. Mild osteopenia. No joint effusion. There is diffuse subcutaneous soft tissue edema. IMPRESSION: No acute fracture or dislocation. Electronically Signed   By: Elgie Collard M.D.   On: 04/09/2018 04:34   Dg Hip Port Unilat W Or Wo Pelvis 1 View Left  Result Date: 04/09/2018 CLINICAL DATA:  58 year old female with left hip pain. EXAM: DG HIP (WITH OR WITHOUT PELVIS) 1V PORT LEFT COMPARISON:  None. FINDINGS: There is no acute fracture or dislocation. No significant arthritic changes. The bones are osteopenic. The soft tissues are grossly unremarkable. IMPRESSION: Negative. Electronically Signed   By: Elgie Collard M.D.   On: 04/09/2018 04:29   Vas [place] Lower Extremity Venous (dvt) (only [facility])  Result Date: 04/24/2018  Lower Venous Study Indications: Edema.  Performing Technologist: Toma Deiters RVS  Examination Guidelines: A complete evaluation includes B-mode imaging, spectral Doppler, color Doppler, and power Doppler as needed of all accessible portions of each vessel. Bilateral testing is considered an integral part of a complete examination. Limited examinations for reoccurring indications may be performed as noted.  Right Venous Findings: +---------+---------------+---------+-----------+----------+-------------------+          CompressibilityPhasicitySpontaneityPropertiesSummary             +---------+---------------+---------+-----------+----------+-------------------+ CFV      Full           Yes      Yes                                       +---------+---------------+---------+-----------+----------+-------------------+ SFJ      Full                                                             +---------+---------------+---------+-----------+----------+-------------------+ FV Prox  Full           Yes      Yes                                      +---------+---------------+---------+-----------+----------+-------------------+ FV Mid   Full                                                             +---------+---------------+---------+-----------+----------+-------------------+  FV DistalFull           Yes      Yes                                      +---------+---------------+---------+-----------+----------+-------------------+ PFV      Full           Yes      Yes                                      +---------+---------------+---------+-----------+----------+-------------------+ POP      Full           Yes      Yes                                      +---------+---------------+---------+-----------+----------+-------------------+ PTV      Full                                                             +---------+---------------+---------+-----------+----------+-------------------+ PERO                                                  Difficult to image                                                        due to edema        +---------+---------------+---------+-----------+----------+-------------------+  Right Technical Findings: Diffiuclt to image the peroneal vein due to edema. Color flow and Doppler appear normal. Compression image difficult to visualize.    Summary: Right: There is no evidence of deep vein thrombosis in the lower extremity. No cystic structure found in the popliteal fossa. No change since exam of 03/17/2018 Left: There is no evidence of a common femoral vein obstruction  *See table(s) above for measurements and observations. Electronically  signed by Lemar Livings MD on 04/24/2018 at 2:18:29 PM.    Final        Subjective: Patient was seen and examined on the day of discharge.  Advised to complete treatment in the hospital.  However she denied any complaints.  She says she is fine to go home and would explore rehab options by herself.   Discharge Exam: Vitals:   04/28/18 2051 04/29/18 1405  BP:    Pulse:    Resp:    Temp:  97.9 F (36.6 C)  SpO2: 99%    Vitals:   04/28/18 0612 04/28/18 0746 04/28/18 2051 04/29/18 1405  BP:      Pulse:      Resp:      Temp:    97.9 F (36.6 C)  TempSrc:    Oral  SpO2:  95% 99%   Weight: 79.7 kg  Height:        General: Pt is alert, awake, not in acute distress Alert oriented x3.  Denies any suicidal ideation.  Denies any delusions or hallucinations. She has a ecchymosis on her right chin from fall.  No complications. Cardiovascular: RRR, S1/S2 +, no rubs, no gallops Respiratory: CTA bilaterally, no wheezing, no rhonchi Abdominal: Soft, NT, ND, bowel sounds +, obese and pendulous.  Midline surgical scar is nontender. Extremities: no edema, no cyanosis    The results of significant diagnostics from this hospitalization (including imaging, microbiology, ancillary and laboratory) are listed below for reference.     Microbiology: No results found for this or any previous visit (from the past 240 hour(s)).   Labs: BNP (last 3 results) Recent Labs    04/18/18 1249 04/19/18 0813 04/24/18 0439  BNP 313.9* 231.2* 461.5*   Basic Metabolic Panel: Recent Labs  Lab 04/24/18 0439 04/27/18 0513 04/27/18 1603 04/28/18 0707 04/29/18 0555  NA 136 140 135 135 135  K 3.6 2.1* 2.7* 2.9* 3.6  CL 93* 92* 90* 91* 94*  CO2 33* 33* 33* 35* 32  GLUCOSE 106* 100* 99 100* 94  BUN 5* <5* <5* <5* <5*  CREATININE 0.44 0.42* 0.43* 0.49 0.44  CALCIUM 8.0* 7.7* 7.6* 8.1* 8.0*  MG  --  1.5*  --  2.0 2.0  PHOS  --  3.5  --   --  3.1   Liver Function Tests: Recent Labs  Lab  04/24/18 0439 04/27/18 0513  AST 23 27  ALT 16 17  ALKPHOS 97 112  BILITOT 1.8* 1.4*  PROT 6.4* 6.7  ALBUMIN 3.3* 3.3*   Recent Labs  Lab 04/24/18 0439 04/27/18 0513  LIPASE 29 28   Recent Labs  Lab 04/27/18 0513  AMMONIA 22   CBC: Recent Labs  Lab 04/24/18 0439 04/27/18 0513 04/29/18 0555  WBC 6.4 4.4 3.4*  NEUTROABS 4.5 2.8 2.3  HGB 9.4* 10.7* 8.1*  HCT 33.9* 37.3 30.7*  MCV 82.5 80.6 85.3  PLT 93* 154 150   Cardiac Enzymes: Recent Labs  Lab 04/24/18 0439 04/27/18 0513  CKTOTAL 105  --   TROPONINI  --  <0.03   BNP: Invalid input(s): POCBNP CBG: No results for input(s): GLUCAP in the last 168 hours. D-Dimer No results for input(s): DDIMER in the last 72 hours. Hgb A1c No results for input(s): HGBA1C in the last 72 hours. Lipid Profile No results for input(s): CHOL, HDL, LDLCALC, TRIG, CHOLHDL, LDLDIRECT in the last 72 hours. Thyroid function studies No results for input(s): TSH, T4TOTAL, T3FREE, THYROIDAB in the last 72 hours.  Invalid input(s): FREET3 Anemia work up Recent Labs    04/28/18 0707  VITAMINB12 474  FOLATE 14.4  FERRITIN 16  TIBC 366  IRON 23*  RETICCTPCT 3.5*   Urinalysis    Component Value Date/Time   COLORURINE STRAW (A) 04/27/2018 0404   APPEARANCEUR CLEAR 04/27/2018 0404   LABSPEC 1.006 04/27/2018 0404   PHURINE 7.0 04/27/2018 0404   GLUCOSEU NEGATIVE 04/27/2018 0404   HGBUR MODERATE (A) 04/27/2018 0404   BILIRUBINUR NEGATIVE 04/27/2018 0404   BILIRUBINUR moderate (A) 06/19/2015 1906   KETONESUR NEGATIVE 04/27/2018 0404   PROTEINUR NEGATIVE 04/27/2018 0404   UROBILINOGEN >=8.0 06/19/2015 1906   UROBILINOGEN 0.2 09/14/2011 2141   NITRITE NEGATIVE 04/27/2018 0404   LEUKOCYTESUR LARGE (A) 04/27/2018 0404   Sepsis Labs Invalid input(s): PROCALCITONIN,  WBC,  LACTICIDVEN Microbiology No results found for this or any previous visit (from the past 240  hour(s)).   Time coordinating discharge: 25  minutes  SIGNED:   Dorcas Carrow, MD  [facility] 04/29/2018, 3:34 PM Pager [phone]  If 7PM-7AM, please contact night-coverage [email or web address] Password [email or web address]

## 2018-04-29 NOTE — Progress Notes (Signed)
PROGRESS NOTE    Anne Black  XLK:[id number] DOB: 05-25-1960 DOA: 04/27/2018 PCP: Patient, No Pcp Per    Brief Narrative:  58 year old female with history of alcoholism, hepatic cirrhosis with ascites and paroxysmal A. fib, chronic hypokalemia and bipolar disorder admitted to the hospital secondary to weakness as per patient.  EMS found patient living in a large home that is nearly uninhabitable due to insect infestation and swelling of furniture by feces.  Patient was also complaining of abdominal pain.  In the emergency room, she was tachycardic.  Potassium was 2.1, magnesium was 1.5.  She was admitted to the hospital with severe electrolyte abnormalities and alcohol withdrawal.   Assessment & Plan:   Principal Problem:   Weakness generalized Active Problems:   Bipolar 1 disorder (HCC)   Tobacco abuse   Anxiety   Hypokalemia   Alcohol use disorder, severe, dependence (HCC)   Alcoholic cirrhosis of liver with ascites (HCC)   Multiple sclerosis (HCC)   Hypomagnesemia   Abdominal wall cellulitis   COPD (chronic obstructive pulmonary disease) (HCC)   Alcohol abuse with intoxication (HCC)   Atrial fibrillation with rapid ventricular response (HCC)   Chronic hypoxemic respiratory failure (HCC)   Alcohol withdrawal delirium (HCC)  Alcoholism with alcohol withdrawal: High risk of delirium tremens. Continue scheduled dose of Librium and as needed benzodiazepine.  Fall precautions.  Seizure precautions.  On multivitamins.  Electrolyte replacement.  Continue counseling. She has been doing reasonably well today. Continue to taper benzodiazepines.  Hypokalemia: Persistent severe hypokalemia. Replaced and improved. Magnesium is normal.  Weakness, failure to thrive, deconditioning: Complicated by ongoing alcohol use.  She will work with PT OT to help determine level of care needed after discharge.  Paroxysmal atrial fibrillation and frequent SVT: Resume diltiazem.  Her rate is controlled.   She is currently sinus rhythm.  TSH is normal.  Given alcoholism, patient is unreliable to take medicine.  Will not treat with therapeutic anticoagulation.  Hepatic cirrhosis with moderate ascites and esophageal varices: Currently no active evidence of infection.  She is on Aldactone that we will continue. Will resume lasix with potassium on discharge.  Less likely SBP, discontinue rocephin.  Smoker: Cessation counseling.  Bipolar disorder, alcoholism: Will resume gabapentin and fluoxetine that she was taking at home.  Advance activities. Ambulate   DVT prophylaxis: Lovenox. Code Status: Full code. Family Communication: No family at bedside. Disposition Plan: Skilled nursing facility.   Consultants:   None.  Procedures:   None.  Antimicrobials:   Rocephin 1 g, 04/27/2018-03/30/2018   Subjective: Patient was seen and examined.  No overnight events.  [place]hab.  Objective: Vitals:   04/27/18 2107 04/28/18 0612 04/28/18 0746 04/28/18 2051  BP: 139/69     Pulse: 99     Resp: 20     Temp: 98 F (36.7 C)     TempSrc:      SpO2: 98%  95% 99%  Weight:  79.7 kg    Height:        Intake/Output Summary (Last 24 hours) at 04/29/2018 1337 Last data filed at 04/29/2018 1040 Gross per 24 hour  Intake 3 ml  Output -  Net 3 ml   Filed Weights   04/27/18 0356 04/28/18 0612  Weight: 83 kg 79.7 kg    Examination:  General exam: Appears calm and comfortable , tangential thought.  Slightly anxious.   Respiratory system: Clear to auscultation. Respiratory effort normal.  Cardiovascular system: S1 & S2 heard, RRR. No JVD, murmurs, rubs, gallops or clicks. No pedal edema. Gastrointestinal system: Abdomen is obese and distended but nontender.  No organomegaly or masses felt. Normal bowel sounds heard.  Midline surgical scar is dry and nontender. Central nervous system: Alert and oriented. No focal neurological deficits.  Patient  is alert and oriented, repeating same words again and again. Extremities: Symmetric 5 x 5 power. Skin: No rashes, lesions or ulcers Psychiatry: Judgement and insight appear abnormal. Mood & affect anxious and flat affect.    Data Reviewed: I have personally reviewed following labs and imaging studies  CBC: Recent Labs  Lab 04/24/18 0439 04/27/18 0513 04/29/18 0555  WBC 6.4 4.4 3.4*  NEUTROABS 4.5 2.8 2.3  HGB 9.4* 10.7* 8.1*  HCT 33.9* 37.3 30.7*  MCV 82.5 80.6 85.3  PLT 93* 154 150   Basic Metabolic Panel: Recent Labs  Lab 04/24/18 0439 04/27/18 0513 04/27/18 1603 04/28/18 0707 04/29/18 0555  NA 136 140 135 135 135  K 3.6 2.1* 2.7* 2.9* 3.6  CL 93* 92* 90* 91* 94*  CO2 33* 33* 33* 35* 32  GLUCOSE 106* 100* 99 100* 94  BUN 5* <5* <5* <5* <5*  CREATININE 0.44 0.42* 0.43* 0.49 0.44  CALCIUM 8.0* 7.7* 7.6* 8.1* 8.0*  MG  --  1.5*  --  2.0 2.0  PHOS  --  3.5  --   --  3.1   GFR: Estimated Creatinine Clearance: 74.9 mL/min (by C-G formula based on SCr of 0.44 mg/dL). Liver Function Tests: Recent Labs  Lab 04/24/18 0439 04/27/18 0513  AST 23 27  ALT 16 17  ALKPHOS 97 112  BILITOT 1.8* 1.4*  PROT 6.4* 6.7  ALBUMIN 3.3* 3.3*   Recent Labs  Lab 04/24/18 0439 04/27/18 0513  LIPASE 29 28   Recent Labs  Lab 04/27/18 0513  AMMONIA 22   Coagulation Profile: No results for input(s): INR, PROTIME in the last 168 hours. Cardiac Enzymes: Recent Labs  Lab 04/24/18 0439 04/27/18 0513  CKTOTAL 105  --   TROPONINI  --  <0.03   BNP (last 3 results) No results for input(s): PROBNP in the last 8760 hours. HbA1C: No results for input(s): HGBA1C in the last 72 hours. CBG: No results for input(s): GLUCAP in the last 168 hours. Lipid Profile: No results for input(s): CHOL, HDL, LDLCALC, TRIG, CHOLHDL, LDLDIRECT in the last 72 hours. Thyroid Function Tests: No results for input(s): TSH, T4TOTAL, FREET4, T3FREE, THYROIDAB in the last 72 hours. Anemia Panel:  Recent Labs    04/28/18 0707  VITAMINB12 474  FOLATE 14.4  FERRITIN 16  TIBC 366  IRON 23*  RETICCTPCT 3.5*   Sepsis Labs: Recent Labs  Lab 04/27/18 0513 04/27/18 0904  LATICACIDVEN 2.5* 2.7*    No results found for this or any previous visit (from the past 240 hour(s)).       Radiology Studies: No results found.      Scheduled Meds: . chlordiazePOXIDE  10 mg Oral TID   Followed by  . [START ON 04/30/2018] chlordiazePOXIDE  5 mg Oral TID   Followed by  . [START ON 05/01/2018] chlordiazePOXIDE  5 mg Oral TID  . diltiazem  60 mg Oral Q8H  . enoxaparin (LOVENOX) injection  40 mg Subcutaneous Q24H  . feeding supplement (ENSURE ENLIVE)  237 mL Oral TID BM  . FLUoxetine  20 mg Oral Daily  . folic acid  1 mg Oral Daily  . gabapentin  200 mg Oral TID  . magnesium oxide  400 mg Oral BID  . mometasone-formoterol  2 puff Inhalation BID  . multivitamin with minerals  1 tablet Oral Daily  . pantoprazole  40 mg Oral Daily  . potassium chloride  40 mEq Oral BID  . sodium chloride flush  3 mL Intravenous Q12H  . spironolactone  25 mg Oral Daily  . thiamine  100 mg Oral Daily   Or  . thiamine  100 mg Intravenous Daily   Continuous Infusions: . cefTRIAXone (ROCEPHIN)  IV 1 g (04/29/18 1054)     LOS: 1 day    Time spent: 25    Dorcas Carrow, MD [facility] Pager [phone]  If 7PM-7AM, please contact night-coverage [email or web address] Password [facility] 04/29/2018, 1:37 PM

## 2018-04-29 NOTE — TOC Initial Note (Signed)
Transition of Care [facility]) - Initial/Assessment Note    Patient Details  Name: Anne Black MRN: [id number] Date of Birth: 1960-05-28  [facility] of [facility] CM/SW Contact:    Elliot Cousin, RN Phone Number: 04/29/2018, 1:17 PM  Clinical Narrative:                 Spoke to pt and states she wants to go to SNF rehab or drug/alcohol rehab at [facility] in [place]. She has been to drug rehab in the past. CSW referral. Waiting PT/OT recommendations.   Expected Discharge Plan: Skilled Nursing Facility Barriers to Discharge: SNF Pending bed offer   Patient Goals and CMS Choice        Expected Discharge Plan and Services Expected Discharge Plan: Skilled Nursing Facility In-house Referral: Clinical Social Work Discharge Planning Services: CM Consult   Living arrangements for the past 2 months: Single Family Home Expected Discharge Date: (unknown)                        Prior Living Arrangements/Services Living arrangements for the past 2 months: Single Family Home Lives with:: Self Patient language and need for interpreter reviewed:: No Do you feel safe going back to the place where you live?: No   prefers to go to SNF rehab or drug rehab   Need for Family Participation in Patient Care: No (Comment)     Criminal Activity/Legal Involvement Pertinent to Current Situation/Hospitalization: No - Comment as needed  Activities of Daily Living Home Assistive Devices/Equipment: Oxygen, Walker (specify type)(front wheeled walker) ADL Screening (condition at time of admission) Patient's cognitive ability adequate to safely complete daily activities?: No Is the patient deaf or have difficulty hearing?: No Does the patient have difficulty seeing, even when wearing glasses/contacts?: No Does the patient have difficulty concentrating, remembering, or making decisions?: Yes Patient able to express need for assistance with ADLs?: Yes Does the patient have difficulty dressing or  bathing?: Yes Independently performs ADLs?: No Communication: Independent Dressing (OT): Needs assistance Is this a change from baseline?: Pre-admission baseline Grooming: Needs assistance Is this a change from baseline?: Pre-admission baseline Feeding: Needs assistance Is this a change from baseline?: Pre-admission baseline Bathing: Needs assistance Is this a change from baseline?: Pre-admission baseline Toileting: Needs assistance Is this a change from baseline?: Pre-admission baseline In/Out Bed: Needs assistance Is this a change from baseline?: Pre-admission baseline Walks in Home: Needs assistance Is this a change from baseline?: Pre-admission baseline Does the patient have difficulty walking or climbing stairs?: Yes Weakness of Legs: Both Weakness of Arms/Hands: None  Permission Sought/Granted Permission sought to share information with : Case Manager, PCP Permission granted to share information with : Yes, Verbal Permission Granted              Emotional Assessment Appearance:: Appears older than stated age Attitude/Demeanor/Rapport: Engaged Affect (typically observed): Anxious Orientation: : Oriented to Self, Oriented to Place, Oriented to  Time, Oriented to Situation Alcohol / Substance Use: Alcohol Use, Tobacco Use Psych Involvement: No (comment)  Admission diagnosis:  Hypokalemia [E87.6] Atrial fibrillation with rapid ventricular response (HCC) [I48.91] Abdominal wall cellulitis [L03.311] Acute alcoholic intoxication without complication (HCC) [F10.920] Patient Active Problem List   Diagnosis Date Noted  . Alcohol withdrawal delirium (HCC) 04/28/2018  . Weakness generalized 04/27/2018  . Gait abnormality 04/24/2018  . Respiratory failure with hypoxia (HCC) 04/18/2018  . Chronic hypoxemic respiratory failure (HCC) 04/11/2018  . Atrial fibrillation with rapid ventricular response (HCC)  03/23/2018  . Head injury   . Fall 03/19/2018  . Syncope 03/19/2018  .  Orbital fracture 03/19/2018  . Scalp laceration 03/19/2018  . Acute respiratory failure with hypoxia and hypercarbia (HCC) 03/19/2018  . Abdominal tenderness 03/19/2018  . Acute on chronic respiratory failure with hypoxia (HCC) 03/17/2018  . Alcohol abuse with intoxication (HCC) 03/17/2018  . ETOH abuse   . Normocytic anemia 03/16/2018  . Acute metabolic encephalopathy 03/16/2018  . Lower leg edema 03/16/2018  . Migraine 03/16/2018  . A-fib (HCC) 02/18/2018  . Community acquired pneumonia of left lower lobe of lung (HCC)   . Weakness 08/21/2017  . COPD exacerbation (HCC) 06/12/2017  . Chest pain with moderate risk for cardiac etiology 05/30/2017  . Abnormal LFTs 05/30/2017  . Tachycardia 05/30/2017  . COPD (chronic obstructive pulmonary disease) (HCC) 04/19/2017  . Right rib fracture 04/15/2017  . Abdominal wall cellulitis 12/30/2016  . S/P exploratory laparotomy 12/30/2016  . Distal radius fracture, left 09/09/2016  . Thrombocytopenia (HCC) 09/09/2016  . Chest x-ray abnormality   . Umbilical hernia without obstruction and without gangrene 05/12/2016  . Itching 02/24/2016  . Ventral hernia without obstruction or gangrene 02/24/2016  . Palliative care encounter   . Ascites   . Tachypnea   . Atrial flutter with rapid ventricular response (HCC)   . Hypomagnesemia   . Hypoxia   . Dehydration 01/22/2016  . Low back ache 12/30/2015  . Non-intractable vomiting with nausea   . Alcohol abuse with alcohol-induced mood disorder (HCC) 12/15/2015  . Acute alcoholism (HCC)   . Abdominal distension 11/04/2015  . Alcohol abuse   . Dyspnea   . Acute respiratory failure (HCC) 10/15/2015  . Ascites due to alcoholic cirrhosis (HCC) 10/15/2015  . Osteoporosis   . Multiple sclerosis (HCC)   . Alcoholic cirrhosis of liver with ascites (HCC) 08/22/2015  . Bipolar disorder, current episode mixed, moderate (HCC) 08/01/2015  . Alcohol use disorder, severe, dependence (HCC) 07/31/2015  .  Macrocytic anemia- due to alcohol abuse with normal B12 & folate levels 05/31/2015  . Severe protein-calorie malnutrition (HCC) 05/31/2015  . Hypokalemia 05/26/2015  . Encephalopathy, hepatic (HCC) 05/26/2015  . Alcohol withdrawal (HCC) 05/18/2015  . Abdominal pain 05/18/2015  . Anxiety 05/18/2015  . Stimulant abuse (HCC) 12/27/2014  . Nicotine abuse 12/27/2014  . Hyperprolactinemia (HCC) 11/15/2014  . Dyslipidemia   . Alcohol use disorder, moderate, dependence (HCC) 10/30/2014  . Tobacco abuse 01/23/2014  . Bipolar 1 disorder (HCC) 04/07/2013  . Noncompliance with therapeutic plan 04/04/2013  . Hereditary and idiopathic peripheral neuropathy 05/01/2012  . Depression 05/01/2012  . GERD (gastroesophageal reflux disease) 05/01/2012  . Osteoarthrosis, unspecified whether generalized or localized, involving lower leg 05/01/2012  . Hyponatremia 07/01/2011   PCP:  Patient, No Pcp Per Pharmacy:   [id number] - [place], [place] - [address] [place] [address] Phone: [phone] Fax: [phone]     Social Determinants of Health (SDOH) Interventions    Readmission Risk Interventions No flowsheet data found.

## 2018-04-29 NOTE — Evaluation (Signed)
Physical Therapy Evaluation Patient Details Name: Anne Black MRN: [id number] DOB: [birth year] Today's Date: 04/29/2018   History of Present Illness  58 year old female with history of alcoholism, hepatic cirrhosis with ascites and paroxysmal A. fib, chronic hypokalemia and bipolar disorder admitted to the hospital secondary to weakness as per patient.  EMS found patient living in a large home that is nearly uninhabitablel  Clinical Impression  Patient well known to PT. Patient generally is  Independent. Patient is to DC today.     Follow Up Recommendations No PT follow up    Equipment Recommendations  None recommended by PT    Recommendations for Other Services       Precautions / Restrictions Precautions Precautions: Fall      Mobility  Bed Mobility Overal bed mobility: Independent                Transfers Overall transfer level: Needs assistance Equipment used: Rolling walker (2 wheeled) Transfers: Sit to/from [employer] Sit to Stand: Modified independent (Device/Increase time) Stand pivot transfers: Modified independent (Device/Increase time)          Ambulation/Gait             General Gait Details: pt declined  Information systems manager Rank[employer] (Stroke Patients Only)       Balance                                             Pertinent Vitals/Pain Pain Assessment: No/denies pain    Home Living Family/patient expects to be discharged to:: Unsure Living Arrangements: Alone                    Prior Function Level of Independence: Independent with assistive device(s)         Comments: Uses walker. History of self neglect     Hand Dominance        Extremity/Trunk Assessment   Upper Extremity Assessment Upper Extremity Assessment: Overall WFL for tasks assessed    Lower Extremity Assessment Lower Extremity Assessment: Overall WFL for tasks assessed        Communication   Communication: No difficulties  Cognition Arousal/Alertness: Awake/alert Behavior During Therapy: WFL for tasks assessed/performed Overall Cognitive Status: Within Functional Limits for tasks assessed                                        General Comments      Exercises     Assessment/Plan    PT Assessment Patent does not need any further PT services  PT Problem List         PT Treatment Interventions      PT Goals (Current goals can be found in the Care Plan section)  Acute Rehab PT Goals Patient Stated Goal: to go home PT Goal Formulation: All assessment and education complete, DC therapy    Frequency     Barriers to discharge        Co-evaluation               AM-PAC PT "6 Clicks" Mobility  Outcome Measure Help needed turning from your back to your side while in a flat bed without using bedrails?:  None Help needed moving from lying on your back to sitting on the side of a flat bed without using bedrails?: None Help needed moving to and from a bed to a chair (including a wheelchair)?: None Help needed standing up from a chair using your arms (e.g., wheelchair or bedside chair)?: None Help needed to walk in hospital room?: A Little Help needed climbing 3-5 steps with a railing? : A Little 6 Click Score: 22    End of Session   Activity Tolerance: Patient tolerated treatment well Patient left: in bed;with call bell/phone within reach;with bed alarm set Nurse Communication: Mobility status PT Visit Diagnosis: Unsteadiness on feet (R26.81)    Time: 1324-4010 PT Time Calculation (min) (ACUTE ONLY): 13 min   Charges:   PT Evaluation $PT Eval Low Complexity: 1 Low          Blanchard Kelch PT Acute Rehabilitation Services Pager [phone] Office [phone]   Rada Hay 04/29/2018, 4:38 PM

## 2018-04-29 NOTE — Progress Notes (Signed)
Assessment unchanged. Pt verbalized understanding of dc instructions. No scripts at dc. Discharged via wc to front entrance to meet cab to transport pt home. Accompanied by charge RN.

## 2018-04-30 ENCOUNTER — Encounter (HOSPITAL_COMMUNITY): Payer: Self-pay

## 2018-04-30 ENCOUNTER — Emergency Department (HOSPITAL_COMMUNITY)
Admission: EM | Admit: 2018-04-30 | Discharge: 2018-05-01 | Disposition: A | Discharge status: Home / Self Care | Payer: Self-pay | Attending: Emergency Medicine | Admitting: Emergency Medicine

## 2018-04-30 ENCOUNTER — Other Ambulatory Visit: Payer: Self-pay

## 2018-04-30 DIAGNOSIS — F1010 Alcohol abuse, uncomplicated: Secondary | ICD-10-CM | POA: Insufficient documentation

## 2018-04-30 DIAGNOSIS — Y939 Activity, unspecified: Secondary | ICD-10-CM | POA: Insufficient documentation

## 2018-04-30 DIAGNOSIS — J45909 Unspecified asthma, uncomplicated: Secondary | ICD-10-CM | POA: Insufficient documentation

## 2018-04-30 DIAGNOSIS — G35 Multiple sclerosis: Secondary | ICD-10-CM | POA: Insufficient documentation

## 2018-04-30 DIAGNOSIS — S60512A Abrasion of left hand, initial encounter: Secondary | ICD-10-CM | POA: Insufficient documentation

## 2018-04-30 DIAGNOSIS — F17210 Nicotine dependence, cigarettes, uncomplicated: Secondary | ICD-10-CM | POA: Insufficient documentation

## 2018-04-30 DIAGNOSIS — F10929 Alcohol use, unspecified with intoxication, unspecified: Secondary | ICD-10-CM | POA: Insufficient documentation

## 2018-04-30 DIAGNOSIS — Y92009 Unspecified place in unspecified non-institutional (private) residence as the place of occurrence of the external cause: Secondary | ICD-10-CM | POA: Insufficient documentation

## 2018-04-30 DIAGNOSIS — Y999 Unspecified external cause status: Secondary | ICD-10-CM | POA: Insufficient documentation

## 2018-04-30 DIAGNOSIS — W19XXXA Unspecified fall, initial encounter: Secondary | ICD-10-CM | POA: Insufficient documentation

## 2018-04-30 DIAGNOSIS — J449 Chronic obstructive pulmonary disease, unspecified: Secondary | ICD-10-CM | POA: Insufficient documentation

## 2018-04-30 NOTE — ED Notes (Signed)
Bed: WA23 Expected date:  Expected time:  Means of arrival:  Comments: 

## 2018-04-30 NOTE — ED Triage Notes (Signed)
Per EMS, Pt consumed unknown amount of ETOH, and has htx of falls. Pt called EMS for fall, pt was found sitting in a chair in her home.

## 2018-04-30 NOTE — ED Provider Notes (Signed)
[facility]-EMERGENCY DEPT Provider Note   CSN: [id number] Arrival date & time: 04/30/18  2136    History   Chief Complaint Chief Complaint  Patient presents with  . Alcohol Intoxication    HPI Anne Black is a 58 y.o. female.     HPI   Brought in by EMS, reportedly for fall, patient called them, she was found in a chair, and could not give much history.  She was discharged from the hospital yesterday after admission for weakness complicated by hypokalemia.  Patient frequently comes to the ED, with alcohol intoxication and falling.  She apparently has difficulty managing her affairs at home, and frequently calls EMS.  She is unable to give history today.  Level 5 caveat-altered mental status  Past Medical History:  Diagnosis Date  . ADHD (attention deficit hyperactivity disorder)   . Alcoholic cirrhosis (HCC)   . Alcoholism (HCC)   . Allergies   . Anxiety   . Arthritis   . Ascites   . Asthma   . Atrial fibrillation with RVR (HCC)   . Bipolar disorder (HCC)   . COPD (chronic obstructive pulmonary disease) (HCC)   . Depression   . Dyspnea   . Dysrhythmia   . GERD (gastroesophageal reflux disease)   . History of hiatal hernia   . Hypokalemia   . Migraine   . Multiple sclerosis (HCC)   . Narcolepsy   . Osteoarthritis of knee   . Osteoporosis   . Pneumonia     Patient Active Problem List   Diagnosis Date Noted  . Alcohol withdrawal delirium (HCC) 04/28/2018  . Weakness generalized 04/27/2018  . Gait abnormality 04/24/2018  . Respiratory failure with hypoxia (HCC) 04/18/2018  . Chronic hypoxemic respiratory failure (HCC) 04/11/2018  . Atrial fibrillation with rapid ventricular response (HCC) 03/23/2018  . Head injury   . Fall 03/19/2018  . Syncope 03/19/2018  . Orbital fracture 03/19/2018  . Scalp laceration 03/19/2018  . Acute respiratory failure with hypoxia and hypercarbia (HCC) 03/19/2018  . Abdominal tenderness 03/19/2018  .  Acute on chronic respiratory failure with hypoxia (HCC) 03/17/2018  . Alcohol abuse with intoxication (HCC) 03/17/2018  . ETOH abuse   . Normocytic anemia 03/16/2018  . Acute metabolic encephalopathy 03/16/2018  . Lower leg edema 03/16/2018  . Migraine 03/16/2018  . A-fib (HCC) 02/18/2018  . Community acquired pneumonia of left lower lobe of lung (HCC)   . Weakness 08/21/2017  . COPD exacerbation (HCC) 06/12/2017  . Chest pain with moderate risk for cardiac etiology 05/30/2017  . Abnormal LFTs 05/30/2017  . Tachycardia 05/30/2017  . COPD (chronic obstructive pulmonary disease) (HCC) 04/19/2017  . Right rib fracture 04/15/2017  . Abdominal wall cellulitis 12/30/2016  . S/P exploratory laparotomy 12/30/2016  . Distal radius fracture, left 09/09/2016  . Thrombocytopenia (HCC) 09/09/2016  . Chest x-ray abnormality   . Umbilical hernia without obstruction and without gangrene 05/12/2016  . Itching 02/24/2016  . Ventral hernia without obstruction or gangrene 02/24/2016  . Palliative care encounter   . Ascites   . Tachypnea   . Atrial flutter with rapid ventricular response (HCC)   . Hypomagnesemia   . Hypoxia   . Dehydration 01/22/2016  . Low back ache 12/30/2015  . Non-intractable vomiting with nausea   . Alcohol abuse with alcohol-induced mood disorder (HCC) 12/15/2015  . Acute alcoholism (HCC)   . Abdominal distension 11/04/2015  . Alcohol abuse   . Dyspnea   . Acute respiratory failure (HCC)  10/15/2015  . Ascites due to alcoholic cirrhosis (HCC) 10/15/2015  . Osteoporosis   . Multiple sclerosis (HCC)   . Alcoholic cirrhosis of liver with ascites (HCC) 08/22/2015  . Bipolar disorder, current episode mixed, moderate (HCC) 08/01/2015  . Alcohol use disorder, severe, dependence (HCC) 07/31/2015  . Macrocytic anemia- due to alcohol abuse with normal B12 & folate levels 05/31/2015  . Severe protein-calorie malnutrition (HCC) 05/31/2015  . Hypokalemia 05/26/2015  .  Encephalopathy, hepatic (HCC) 05/26/2015  . Alcohol withdrawal (HCC) 05/18/2015  . Abdominal pain 05/18/2015  . Anxiety 05/18/2015  . Stimulant abuse (HCC) 12/27/2014  . Nicotine abuse 12/27/2014  . Hyperprolactinemia (HCC) 11/15/2014  . Dyslipidemia   . Alcohol use disorder, moderate, dependence (HCC) 10/30/2014  . Tobacco abuse 01/23/2014  . Bipolar 1 disorder (HCC) 04/07/2013  . Noncompliance with therapeutic plan 04/04/2013  . Hereditary and idiopathic peripheral neuropathy 05/01/2012  . Depression 05/01/2012  . GERD (gastroesophageal reflux disease) 05/01/2012  . Osteoarthrosis, unspecified whether generalized or localized, involving lower leg 05/01/2012  . Hyponatremia 07/01/2011    Past Surgical History:  Procedure Laterality Date  . ABDOMINAL WALL DEFECT REPAIR N/A 12/30/2016   Procedure: EXPLORATORY LAPAROTOMY WITH REPAIR ABDOMINAL WALL VENTRAL HERNIA;  Surgeon: [name], MD;  Location: [facility] OR;  Service: General;  Laterality: N/A;  . APPLICATION OF WOUND VAC N/A 12/30/2016   Procedure: APPLICATION OF WOUND VAC;  Surgeon: [name], MD;  Location: [facility] OR;  Service: General;  Laterality: N/A;  . CESAREAN SECTION  [phone]  . FRACTURE SURGERY    . HERNIA REPAIR    . MYRINGOTOMY WITH TUBE PLACEMENT Bilateral   . ORIF WRIST FRACTURE Left 09/09/2016   Procedure: OPEN REDUCTION INTERNAL FIXATION (ORIF) LEFT WRIST FRACTURE, LEFT CARPAL TUNNEL RELEASE;  Surgeon: Dominica Severin, MD;  Location: [facility] ORS;  Service: Orthopedics;  Laterality: Left;  . TONSILLECTOMY       OB History   No obstetric history on file.      Home Medications    Prior to Admission medications   Medication Sig Start Date End Date Taking? Authorizing Provider  acetaminophen (TYLENOL) 500 MG tablet Take 1 tablet (500 mg total) by mouth every 6 (six) hours as needed for mild pain. Patient taking differently: Take 1,000 mg by mouth every 6 (six) hours as needed for mild pain.  03/28/18    Rolly Salter, MD  albuterol (PROVENTIL HFA;VENTOLIN HFA) 108 (90 Base) MCG/ACT inhaler Inhale 2 puffs into the lungs every 4 (four) hours as needed for wheezing or shortness of breath.    [provider]  aluminum-magnesium hydroxide-simethicone (MAALOX) 200-200-20 MG/5ML SUSP Take 30 mLs by mouth 4 (four) times daily as needed (indigestion).     [provider]  chlordiazePOXIDE (LIBRIUM) 25 MG capsule 50mg  PO TID x 1D, then 25-50mg  PO BID X 1D, then 25-50mg  PO QD X 1D Patient taking differently: Take 25-50 mg by mouth daily as needed for anxiety or withdrawal.  04/17/18   McDonald, Mia A, PA-C  diltiazem (CARDIZEM CD) 120 MG 24 hr capsule Take 1 capsule (120 mg total) by mouth daily for 30 days. 04/14/18 05/14/18  Tegeler, Canary Brim, MD  FLUoxetine (PROZAC) 20 MG capsule Take 1 capsule (20 mg total) by mouth daily for 30 doses. Patient not taking: Reported on 04/27/2018 04/14/18 05/14/18  Tegeler, Canary Brim, MD  Fluticasone-Salmeterol [facility] INHUB) 250-50 MCG/DOSE AEPB Inhale 2 puffs into the lungs 2 (two) times daily.     [provider]  furosemide (  LASIX) 40 MG tablet Take 40 mg twice daily till 04/02/2018, after that take 40 mg daily. Patient taking differently: Take 40 mg by mouth daily. Take 40 mg twice daily till 04/02/2018, after that take 40 mg daily. 04/14/18   Tegeler, Canary Brimhristopher J, MD  gabapentin (NEURONTIN) 100 MG capsule Take 2 capsules (200 mg total) by mouth 3 (three) times daily for 30 days. 04/14/18 05/14/18  Tegeler, Canary Brimhristopher J, MD  hydrOXYzine (ATARAX/VISTARIL) 25 MG tablet Take 1 tablet (25 mg total) by mouth daily as needed for up to 30 doses (anxiety). Patient taking differently: Take 25 mg by mouth daily as needed for anxiety (withdrawal).  04/14/18   Tegeler, Canary Brimhristopher J, MD  ipratropium-albuterol (DUONEB) 0.5-2.5 (3) MG/3ML SOLN Take 3 mLs by nebulization 2 (two) times daily. AND every 4 hours as needed for wheezing/shortness of breath    [provider]  lactulose (CHRONULAC) 10 GM/15ML solution Take 45 mLs (30 g total) by mouth 3 (three) times daily for 30 days. Patient not taking: Reported on 04/19/2018 04/14/18 05/14/18  Tegeler, Canary Brimhristopher J, MD  lidocaine (LIDODERM) 5 % Place 1 patch onto the skin daily. Remove & Discard patch within 12 hours or as directed by MD 04/09/18   Nicanor AlconPalumbo, April, MD  magnesium oxide (MAG-OX) 400 MG tablet Take 400 mg by mouth daily.    [provider]  pantoprazole (PROTONIX) 40 MG tablet Take 1 tablet (40 mg total) by mouth daily for 30 days. 04/14/18 05/14/18  Tegeler, Canary Brimhristopher J, MD  potassium chloride SA (K-DUR,KLOR-CON) 20 MEQ tablet Take 2 tablets (40 mEq total) by mouth 2 (two) times daily. 04/19/18   Bethel BornGekas, Kelly Marie, PA-C  rizatriptan (MAXALT) 5 MG tablet Take 5 mg by mouth as needed for migraine. May repeat in 2 hours if needed; not to exceed 30mg  in 24 hours    [provider]  spironolactone (ALDACTONE) 25 MG tablet Take 1 tablet (25 mg total) by mouth daily for 30 days. 04/14/18 05/14/18  Tegeler, Canary Brimhristopher J, MD    Family History Family History  Problem Relation Age of Onset  . Arrhythmia Mother   . Neuropathy Mother   . Coronary artery disease Mother   . Heart disease Mother   . Heart disease Father   . Hypertension Father   . Heart attack Father   . Multiple sclerosis Maternal Aunt     Social History Social History   Tobacco Use  . Smoking status: Current Every Day Smoker    Packs/day: 1.00    Years: 35.00    Pack years: 35.00    Types: Cigarettes  . Smokeless tobacco: Never Used  . Tobacco comment: Pt states smoked this morning  Substance Use Topics  . Alcohol use: Yes    Comment: 1 bottle of wine per day per patient.   . Drug use: No     Allergies   Patient has no known allergies.   Review of Systems Review of Systems  Unable to perform ROS: Mental status change     Physical Exam Updated Vital Signs BP 94/83   Pulse 79   Temp (!) 97.5  F (36.4 C) (Axillary)   Resp 19   SpO2 100%   Physical Exam Vitals signs and nursing note reviewed.  Constitutional:      General: She is in acute distress.     Appearance: She is well-developed. She is obese. She is not ill-appearing, toxic-appearing or diaphoretic.     Comments: Disheveled, poor hygiene.  HENT:     Head: Normocephalic and atraumatic.     Right Ear: External ear normal.     Left Ear: External ear normal.  Eyes:     Conjunctiva/sclera: Conjunctivae normal.     Pupils: Pupils are equal, round, and reactive to light.  Neck:     Musculoskeletal: Normal range of motion and neck supple.     Trachea: Phonation normal.  Cardiovascular:     Rate and Rhythm: Normal rate and regular rhythm.     Heart sounds: Normal heart sounds.  Pulmonary:     Effort: Pulmonary effort is normal.     Breath sounds: Normal breath sounds.  Abdominal:     Palpations: Abdomen is soft.     Tenderness: There is no abdominal tenderness.  Musculoskeletal:     Comments: No asymmetry of strength.  Skin:    General: Skin is warm and dry.     Comments: Small abrasion dorsal left hand.  Normal range of motion hand.  Neurological:     Mental Status: She is alert.     Cranial Nerves: No cranial nerve deficit.     Motor: No abnormal muscle tone.     Comments: Dysarthria, no aphasia.  Psychiatric:     Comments: Interactive, answers questions, confused.      ED Treatments / Results  Labs (all labs ordered are listed, but only abnormal results are displayed) Labs Reviewed - No data to display  EKG None  Radiology No results found.  Procedures Procedures (including critical care time)  Medications Ordered in ED Medications - No data to display   Initial Impression / Assessment and Plan / ED Course  I have reviewed the triage vital signs and the nursing notes.  Pertinent labs & imaging results that were available during my care of the patient were reviewed by me and considered in  my medical decision making (see chart for details).         Patient Vitals for the past 24 hrs:  BP Temp Temp src Pulse Resp SpO2  04/30/18 2149 - - - - - 100 %  04/30/18 2147 94/83 (!) 97.5 F (36.4 C) Axillary 79 19 100 %  04/30/18 2100 - - - - - 100 %    11:07 PM Reevaluation with update and discussion. After initial assessment and treatment, an updated evaluation reveals patient is alert and comfortable attempting to call someone on the phone.  Blood pressure now greater than 100 systolic.  Patient informed that she would be discharged when she could find a ride home. Mancel Bale   Medical Decision Making: Alcohol intoxication, recurrent, without clinical signs for instability.  Occasion for further ED evaluation or intervention at this time.  CRITICAL CARE-no Performed by: Mancel Bale  Nursing Notes Reviewed/ Care Coordinated Applicable Imaging Reviewed Interpretation of Laboratory Data incorporated into ED treatment  The patient appears reasonably screened and/or stabilized for discharge and I doubt any other medical condition or other [facility] requiring further screening, evaluation, or treatment in the ED at this time prior to discharge.  Plan: Home Medications-usual; Home Treatments-avoid alcohol; return here if the recommended treatment, does not improve the symptoms; Recommended follow up-ECP checkup 1 week and as needed   Final Clinical Impressions(s) / ED Diagnoses   Final diagnoses:  Alcoholic intoxication with complication [facility])    ED Discharge Orders    None       Mancel Bale, MD 04/30/18 2312

## 2018-04-30 NOTE — ED Triage Notes (Signed)
Pt noncompliant with wearing oxygen at home. O2 100% after placing pt on 4L of oxygen.

## 2018-04-30 NOTE — Discharge Instructions (Signed)
Avoid all forms of alcohol.  Try to eat and drink plenty of water regularly.  See your doctor for checkup in 1 week.

## 2018-04-30 NOTE — ED Notes (Signed)
PTAR contacted 

## 2018-04-30 NOTE — ED Notes (Signed)
Daughter of pt was called and asked to meet EMS at pts house to for pts discharge.

## 2018-04-30 NOTE — ED Notes (Signed)
Pt given water, ice chips, and a sandwich to PO challenge.

## 2018-05-02 ENCOUNTER — Emergency Department (HOSPITAL_COMMUNITY): Payer: Self-pay

## 2018-05-02 ENCOUNTER — Other Ambulatory Visit: Payer: Self-pay

## 2018-05-02 ENCOUNTER — Encounter (HOSPITAL_COMMUNITY): Payer: Self-pay

## 2018-05-02 ENCOUNTER — Emergency Department (HOSPITAL_COMMUNITY)
Admission: EM | Admit: 2018-05-02 | Discharge: 2018-05-02 | Discharge status: Against Medical Advice | Payer: Self-pay | Attending: Emergency Medicine | Admitting: Emergency Medicine

## 2018-05-02 DIAGNOSIS — E876 Hypokalemia: Secondary | ICD-10-CM | POA: Insufficient documentation

## 2018-05-02 DIAGNOSIS — J45909 Unspecified asthma, uncomplicated: Secondary | ICD-10-CM | POA: Insufficient documentation

## 2018-05-02 DIAGNOSIS — Z79899 Other long term (current) drug therapy: Secondary | ICD-10-CM | POA: Insufficient documentation

## 2018-05-02 DIAGNOSIS — R110 Nausea: Secondary | ICD-10-CM

## 2018-05-02 DIAGNOSIS — G35 Multiple sclerosis: Secondary | ICD-10-CM | POA: Insufficient documentation

## 2018-05-02 DIAGNOSIS — F17210 Nicotine dependence, cigarettes, uncomplicated: Secondary | ICD-10-CM | POA: Insufficient documentation

## 2018-05-02 DIAGNOSIS — Z5329 Procedure and treatment not carried out because of patient's decision for other reasons: Secondary | ICD-10-CM | POA: Insufficient documentation

## 2018-05-02 DIAGNOSIS — Y901 Blood alcohol level of 20-39 mg/100 ml: Secondary | ICD-10-CM | POA: Insufficient documentation

## 2018-05-02 DIAGNOSIS — F1010 Alcohol abuse, uncomplicated: Secondary | ICD-10-CM | POA: Insufficient documentation

## 2018-05-02 LAB — CBC WITH DIFFERENTIAL/PLATELET
Abs Immature Granulocytes: 0.01 10*3/uL (ref 0.00–0.07)
BASOS PCT: 1 %
Basophils Absolute: 0 10*3/uL (ref 0.0–0.1)
Eosinophils Absolute: 0 10*3/uL (ref 0.0–0.5)
Eosinophils Relative: 0 %
HCT: 31.8 % — ABNORMAL LOW (ref 36.0–46.0)
Hemoglobin: 8.9 g/dL — ABNORMAL LOW (ref 12.0–15.0)
Immature Granulocytes: 0 %
Lymphocytes Relative: 16 %
Lymphs Abs: 0.6 10*3/uL — ABNORMAL LOW (ref 0.7–4.0)
MCH: 23.4 pg — ABNORMAL LOW (ref 26.0–34.0)
MCHC: 28 g/dL — ABNORMAL LOW (ref 30.0–36.0)
MCV: 83.5 fL (ref 80.0–100.0)
Monocytes Absolute: 0.7 10*3/uL (ref 0.1–1.0)
Monocytes Relative: 18 %
NEUTROS PCT: 65 %
NRBC: 0 % (ref 0.0–0.2)
Neutro Abs: 2.3 10*3/uL (ref 1.7–7.7)
Platelets: 173 10*3/uL (ref 150–400)
RBC: 3.81 MIL/uL — ABNORMAL LOW (ref 3.87–5.11)
RDW: 22.1 % — ABNORMAL HIGH (ref 11.5–15.5)
WBC: 3.7 10*3/uL — ABNORMAL LOW (ref 4.0–10.5)

## 2018-05-02 LAB — BASIC METABOLIC PANEL
Anion gap: 13 (ref 5–15)
BUN: 5 mg/dL — ABNORMAL LOW (ref 6–20)
CO2: 31 mmol/L (ref 22–32)
Calcium: 7.6 mg/dL — ABNORMAL LOW (ref 8.9–10.3)
Chloride: 94 mmol/L — ABNORMAL LOW (ref 98–111)
Creatinine, Ser: 0.42 mg/dL — ABNORMAL LOW (ref 0.44–1.00)
GFR calc non Af Amer: >60 mL/min (ref >60)
Glucose, Bld: 81 mg/dL (ref 70–99)
Potassium: 2.7 mmol/L — CL (ref 3.5–5.1)
SODIUM: 138 mmol/L (ref 135–145)

## 2018-05-02 LAB — ETHANOL: Alcohol, Ethyl (B): 29 mg/dL — ABNORMAL HIGH (ref <10)

## 2018-05-02 MED ORDER — POTASSIUM CHLORIDE CRYS ER 20 MEQ PO TBCR
40.0000 meq | EXTENDED_RELEASE_TABLET | Freq: Once | ORAL | Status: AC
  Administered 2018-05-02: 40 meq via ORAL
  Filled 2018-05-02: qty 2

## 2018-05-02 MED ORDER — LORAZEPAM 1 MG PO TABS
1.0000 mg | ORAL_TABLET | Freq: Once | ORAL | Status: AC
  Administered 2018-05-02: 1 mg via ORAL
  Filled 2018-05-02: qty 1

## 2018-05-02 MED ORDER — ONDANSETRON HCL 4 MG/2ML IJ SOLN
4.0000 mg | Freq: Once | INTRAMUSCULAR | Status: AC
  Administered 2018-05-02: 4 mg via INTRAVENOUS
  Filled 2018-05-02: qty 2

## 2018-05-02 NOTE — ED Notes (Signed)
Pt explained lab results of K, wishes to leave AMA regardless.

## 2018-05-02 NOTE — ED Notes (Addendum)
Charge RN, Meghan, ambulated pt to and from restroom without incident.

## 2018-05-02 NOTE — ED Notes (Signed)
Pt discussed staying in hospital with PA and myself after lab results were discussed, and after Pt demanded IV dc and to leave AMA. Pt agreed to stay for medication. Immediately after Pt was administered medication, then demanded to leave again. Pt was explained that if she wished to leave without discharge from Provider she would be leaving AMA. Pt continued to demand to leave, which Pt then did AMA.

## 2018-05-02 NOTE — ED Notes (Signed)
This writer was called into the room by the patient.  She said the staff have been very rude to her and accusatory to her, and not giving her what she wants.  When asked what she wants, she said " more Zofran, Im vomiting". Pt was eating and drinking at this time.  Pt was told the doctor did not feel it was necessary to order antiemetics at this time.  She was advised the PA was waiting on the rest of her lab results before discussing discharge.  Pt said ok.  After I spoke to the PA, I returned to the patients room to find her on the telephone calling someone to come pick her up before being discharged".  Pt asked for a cab voucher. I told her the busses are running free at this time due to the corona virus. She said she is vomiting too much and can't take the bus, again while eating.

## 2018-05-02 NOTE — ED Notes (Signed)
Pt adamantly refusing EKG. Wants to leave and go home ASAP. Requested we call a cab for her.

## 2018-05-02 NOTE — ED Notes (Signed)
Bed: WA07 Expected date:  Expected time:  Means of arrival:  Comments: EMS Kathie Dike

## 2018-05-02 NOTE — ED Provider Notes (Signed)
[facility]-EMERGENCY DEPT Provider Note   CSN: [id number] Arrival date & time: 05/02/18  1442    History   Chief Complaint Chief Complaint  Patient presents with  . Alcohol Intoxication    HPI Anne Black is a 58 y.o. female who presents with nausea. PMH significant for alcoholism, frequent ED visits, A.fib, COPD, cirrhosis with ascites and esophageal varices, anxiety, migraines, HTN, chronic hypokalemia, frequent falls. She states she is nauseous and is having muscle cramps and is in withdrawals. She drank a little bit of wine this morning but that was the last of her alcohol. She is a poor historian. Was in the hospital for abdominal wall cellulitis and A.fib with RVR in mid March. Has been seen in the ED for uncomplicated alcohol intoxication since then. PT evaluated her while in the hospital and noted she is generally independent although has self-neglect and refusal of various home health services which has made it difficult to place her or keep her in a safe environment. She typically ambulates with a walker but has multiple falls. She had a fall a couple days ago and hit her head.     HPI  Past Medical History:  Diagnosis Date  . ADHD (attention deficit hyperactivity disorder)   . Alcoholic cirrhosis (HCC)   . Alcoholism (HCC)   . Allergies   . Anxiety   . Arthritis   . Ascites   . Asthma   . Atrial fibrillation with RVR (HCC)   . Bipolar disorder (HCC)   . COPD (chronic obstructive pulmonary disease) (HCC)   . Depression   . Dyspnea   . Dysrhythmia   . GERD (gastroesophageal reflux disease)   . History of hiatal hernia   . Hypokalemia   . Migraine   . Multiple sclerosis (HCC)   . Narcolepsy   . Osteoarthritis of knee   . Osteoporosis   . Pneumonia     Patient Active Problem List   Diagnosis Date Noted  . Alcohol withdrawal delirium (HCC) 04/28/2018  . Weakness generalized 04/27/2018  . Gait abnormality 04/24/2018  . Respiratory  failure with hypoxia (HCC) 04/18/2018  . Chronic hypoxemic respiratory failure (HCC) 04/11/2018  . Atrial fibrillation with rapid ventricular response (HCC) 03/23/2018  . Head injury   . Fall 03/19/2018  . Syncope 03/19/2018  . Orbital fracture 03/19/2018  . Scalp laceration 03/19/2018  . Acute respiratory failure with hypoxia and hypercarbia (HCC) 03/19/2018  . Abdominal tenderness 03/19/2018  . Acute on chronic respiratory failure with hypoxia (HCC) 03/17/2018  . Alcohol abuse with intoxication (HCC) 03/17/2018  . ETOH abuse   . Normocytic anemia 03/16/2018  . Acute metabolic encephalopathy 03/16/2018  . Lower leg edema 03/16/2018  . Migraine 03/16/2018  . A-fib (HCC) 02/18/2018  . Community acquired pneumonia of left lower lobe of lung (HCC)   . Weakness 08/21/2017  . COPD exacerbation (HCC) 06/12/2017  . Chest pain with moderate risk for cardiac etiology 05/30/2017  . Abnormal LFTs 05/30/2017  . Tachycardia 05/30/2017  . COPD (chronic obstructive pulmonary disease) (HCC) 04/19/2017  . Right rib fracture 04/15/2017  . Abdominal wall cellulitis 12/30/2016  . S/P exploratory laparotomy 12/30/2016  . Distal radius fracture, left 09/09/2016  . Thrombocytopenia (HCC) 09/09/2016  . Chest x-ray abnormality   . Umbilical hernia without obstruction and without gangrene 05/12/2016  . Itching 02/24/2016  . Ventral hernia without obstruction or gangrene 02/24/2016  . Palliative care encounter   . Ascites   . Tachypnea   .  Atrial flutter with rapid ventricular response (HCC)   . Hypomagnesemia   . Hypoxia   . Dehydration 01/22/2016  . Low back ache 12/30/2015  . Non-intractable vomiting with nausea   . Alcohol abuse with alcohol-induced mood disorder (HCC) 12/15/2015  . Acute alcoholism (HCC)   . Abdominal distension 11/04/2015  . Alcohol abuse   . Dyspnea   . Acute respiratory failure (HCC) 10/15/2015  . Ascites due to alcoholic cirrhosis (HCC) 10/15/2015  . Osteoporosis   .  Multiple sclerosis (HCC)   . Alcoholic cirrhosis of liver with ascites (HCC) 08/22/2015  . Bipolar disorder, current episode mixed, moderate (HCC) 08/01/2015  . Alcohol use disorder, severe, dependence (HCC) 07/31/2015  . Macrocytic anemia- due to alcohol abuse with normal B12 & folate levels 05/31/2015  . Severe protein-calorie malnutrition (HCC) 05/31/2015  . Hypokalemia 05/26/2015  . Encephalopathy, hepatic (HCC) 05/26/2015  . Alcohol withdrawal (HCC) 05/18/2015  . Abdominal pain 05/18/2015  . Anxiety 05/18/2015  . Stimulant abuse (HCC) 12/27/2014  . Nicotine abuse 12/27/2014  . Hyperprolactinemia (HCC) 11/15/2014  . Dyslipidemia   . Alcohol use disorder, moderate, dependence (HCC) 10/30/2014  . Tobacco abuse 01/23/2014  . Bipolar 1 disorder (HCC) 04/07/2013  . Noncompliance with therapeutic plan 04/04/2013  . Hereditary and idiopathic peripheral neuropathy 05/01/2012  . Depression 05/01/2012  . GERD (gastroesophageal reflux disease) 05/01/2012  . Osteoarthrosis, unspecified whether generalized or localized, involving lower leg 05/01/2012  . Hyponatremia 07/01/2011    Past Surgical History:  Procedure Laterality Date  . ABDOMINAL WALL DEFECT REPAIR N/A 12/30/2016   Procedure: EXPLORATORY LAPAROTOMY WITH REPAIR ABDOMINAL WALL VENTRAL HERNIA;  Surgeon: [name], MD;  Location: [facility] OR;  Service: General;  Laterality: N/A;  . APPLICATION OF WOUND VAC N/A 12/30/2016   Procedure: APPLICATION OF WOUND VAC;  Surgeon: [name], MD;  Location: [facility] OR;  Service: General;  Laterality: N/A;  . CESAREAN SECTION  [phone]  . FRACTURE SURGERY    . HERNIA REPAIR    . MYRINGOTOMY WITH TUBE PLACEMENT Bilateral   . ORIF WRIST FRACTURE Left 09/09/2016   Procedure: OPEN REDUCTION INTERNAL FIXATION (ORIF) LEFT WRIST FRACTURE, LEFT CARPAL TUNNEL RELEASE;  Surgeon: Dominica Severin, MD;  Location: [facility] ORS;  Service: Orthopedics;  Laterality: Left;  . TONSILLECTOMY       OB History    No obstetric history on file.      Home Medications    Prior to Admission medications   Medication Sig Start Date End Date Taking? Authorizing Provider  acetaminophen (TYLENOL) 500 MG tablet Take 1 tablet (500 mg total) by mouth every 6 (six) hours as needed for mild pain. Patient taking differently: Take 1,000 mg by mouth every 6 (six) hours as needed for mild pain.  03/28/18   Rolly Salter, MD  albuterol (PROVENTIL HFA;VENTOLIN HFA) 108 (90 Base) MCG/ACT inhaler Inhale 2 puffs into the lungs every 4 (four) hours as needed for wheezing or shortness of breath.    [provider]  aluminum-magnesium hydroxide-simethicone (MAALOX) 200-200-20 MG/5ML SUSP Take 30 mLs by mouth 4 (four) times daily as needed (indigestion).     [provider]  chlordiazePOXIDE (LIBRIUM) 25 MG capsule 50mg  PO TID x 1D, then 25-50mg  PO BID X 1D, then 25-50mg  PO QD X 1D Patient taking differently: Take 25-50 mg by mouth daily as needed for anxiety or withdrawal.  04/17/18   McDonald, Mia A, PA-C  diltiazem (CARDIZEM CD) 120 MG 24 hr capsule Take 1 capsule (120 mg total) by  mouth daily for 30 days. 04/14/18 05/14/18  Tegeler, Canary Brim, MD  FLUoxetine (PROZAC) 20 MG capsule Take 1 capsule (20 mg total) by mouth daily for 30 doses. Patient not taking: Reported on 04/27/2018 04/14/18 05/14/18  Tegeler, Canary Brim, MD  Fluticasone-Salmeterol [facility] INHUB) 250-50 MCG/DOSE AEPB Inhale 2 puffs into the lungs 2 (two) times daily.     [provider]  furosemide (LASIX) 40 MG tablet Take 40 mg twice daily till 04/02/2018, after that take 40 mg daily. Patient taking differently: Take 40 mg by mouth daily. Take 40 mg twice daily till 04/02/2018, after that take 40 mg daily. 04/14/18   Tegeler, Canary Brim, MD  gabapentin (NEURONTIN) 100 MG capsule Take 2 capsules (200 mg total) by mouth 3 (three) times daily for 30 days. 04/14/18 05/14/18  Tegeler, Canary Brim, MD  hydrOXYzine (ATARAX/VISTARIL) 25 MG  tablet Take 1 tablet (25 mg total) by mouth daily as needed for up to 30 doses (anxiety). Patient taking differently: Take 25 mg by mouth daily as needed for anxiety (withdrawal).  04/14/18   Tegeler, Canary Brim, MD  ipratropium-albuterol (DUONEB) 0.5-2.5 (3) MG/3ML SOLN Take 3 mLs by nebulization 2 (two) times daily. AND every 4 hours as needed for wheezing/shortness of breath    [provider]  lactulose (CHRONULAC) 10 GM/15ML solution Take 45 mLs (30 g total) by mouth 3 (three) times daily for 30 days. Patient not taking: Reported on 04/19/2018 04/14/18 05/14/18  Tegeler, Canary Brim, MD  lidocaine (LIDODERM) 5 % Place 1 patch onto the skin daily. Remove & Discard patch within 12 hours or as directed by MD 04/09/18   Nicanor Alcon, April, MD  magnesium oxide (MAG-OX) 400 MG tablet Take 400 mg by mouth daily.    [provider]  pantoprazole (PROTONIX) 40 MG tablet Take 1 tablet (40 mg total) by mouth daily for 30 days. 04/14/18 05/14/18  Tegeler, Canary Brim, MD  potassium chloride SA (K-DUR,KLOR-CON) 20 MEQ tablet Take 2 tablets (40 mEq total) by mouth 2 (two) times daily. 04/19/18   Bethel Born, PA-C  rizatriptan (MAXALT) 5 MG tablet Take 5 mg by mouth as needed for migraine. May repeat in 2 hours if needed; not to exceed  in 24 hours    [provider]  spironolactone (ALDACTONE) 25 MG tablet Take 1 tablet (25 mg total) by mouth daily for 30 days. 04/14/18 05/14/18  Tegeler, Canary Brim, MD    Family History Family History  Problem Relation Age of Onset  . Arrhythmia Mother   . Neuropathy Mother   . Coronary artery disease Mother   . Heart disease Mother   . Heart disease Father   . Hypertension Father   . Heart attack Father   . Multiple sclerosis Maternal Aunt     Social History Social History   Tobacco Use  . Smoking status: Current Every Day Smoker    Packs/day: 1.00    Years: 35.00    Pack years: 35.00    Types: Cigarettes  . Smokeless tobacco:  Never Used  . Tobacco comment: Pt states smoked this morning  Substance Use Topics  . Alcohol use: Yes    Comment: 1 bottle of wine per day per patient.   . Drug use: No     Allergies   Patient has no known allergies.   Review of Systems Review of Systems  Constitutional: Negative for fever.  Respiratory: Negative for cough and shortness of breath.   Cardiovascular: Negative for chest pain.  Gastrointestinal: Positive for nausea. Negative for abdominal pain, diarrhea and vomiting.  Genitourinary: Negative for dysuria.  Musculoskeletal: Positive for myalgias.  Neurological: Positive for tremors and weakness.  Hematological: Bruises/bleeds easily.  All other systems reviewed and are negative.    Physical Exam Updated Vital Signs BP 122/82   Pulse 94   Temp 98 F (36.7 C) (Oral)   Resp 18   SpO2 100%   Physical Exam Vitals signs and nursing note reviewed.  Constitutional:      General: She is not in acute distress.    Appearance: Normal appearance. She is well-developed. She is not ill-appearing.     Comments: Calm, cooperative. Tremulous but conversant. Disheveled and malodorous  HENT:     Head: Normocephalic. Raccoon eyes (left) present.     Jaw: There is normal jaw occlusion.     Comments: Healing periorbital ecchymosis Eyes:     General: No scleral icterus.       Right eye: No discharge.        Left eye: No discharge.     Conjunctiva/sclera: Conjunctivae normal.     Pupils: Pupils are equal, round, and reactive to light.  Neck:     Musculoskeletal: Normal range of motion.  Cardiovascular:     Rate and Rhythm: Normal rate and regular rhythm.  Pulmonary:     Effort: Pulmonary effort is normal. No respiratory distress.     Breath sounds: Normal breath sounds.  Abdominal:     General: There is no distension.     Palpations: Abdomen is soft.     Tenderness: There is no abdominal tenderness.  Skin:    General: Skin is warm and dry.     Findings: Bruising  (multiple bruises all over the body) present.  Neurological:     Mental Status: She is alert and oriented to person, place, and time.     Comments: Ambulatory with minimal assistance  Psychiatric:        Attention and Perception: Attention normal.        Mood and Affect: Affect is flat.        Speech: Speech normal.        Behavior: Behavior normal. Behavior is cooperative.        Judgment: Judgment is impulsive and inappropriate.      ED Treatments / Results  Labs (all labs ordered are listed, but only abnormal results are displayed) Labs Reviewed  BASIC METABOLIC PANEL  CBC WITH DIFFERENTIAL/PLATELET  ETHANOL    EKG None  Radiology No results found.  Procedures Procedures (including critical care time)  Medications Ordered in ED Medications - No data to display   Initial Impression / Assessment and Plan / ED Course  I have reviewed the triage vital signs and the nursing notes.  Pertinent labs & imaging results that were available during my care of the patient were reviewed by me and considered in my medical decision making (see chart for details).  58 year old female presents with nausea, shaking, complaints of withdrawal. Her vitals are normal here. She has multiple bruises all over her body and ecchymosis around her L eye. Labs, ETOH, CT head and face ordered. She was ordered Zofran as well.   Nursing informed me the patient wants more nausea medicine. She has already received 8mg  of Zofran. She has hx of prolonged QT. Repeat EKG ordered.  4:55 PM Pt's potassium is 2.7. Hgb is stable. She was telling nursing that she was going to leave AMA so  they took her IV out. Discussed with patient that we would like to replace the potassium. She is agreeable. Will give her a dose of Ativan as well because she is anxious, nauseous, tremulous. Her ETOH is 29 and very well may be withdrawing.  5:24 PM Pt informed nurses she was leaving AMA. I did not have a chance to talk to her  before she left or give her any prescriptions.   Final Clinical Impressions(s) / ED Diagnoses   Final diagnoses:  Nausea  ETOH abuse  Hypokalemia    ED Discharge Orders    None       Bethel Born, PA-C 05/02/18 1727    Maia Plan, MD 05/02/18 2311

## 2018-05-02 NOTE — Progress Notes (Signed)
CSW spoke with EMS regarding concerns for patient and patients current living situation. CSW directed EMS to patients APS worker- EMS agreeable and thanked CSW.   Stacy Gardner, LCSW Care Coordination Department System Wide Float  [phone]

## 2018-05-02 NOTE — ED Triage Notes (Signed)
EMS reports from home, ETOH intoxication. Pt has had multiple visits for ETOH abuse recently.  125/64 HR 100 RR 18 CBG 150  22 left hand  NS 4mg  Zofran enroute

## 2018-05-02 NOTE — ED Notes (Addendum)
This RN had a long conversation with Ms Scarpitti about not abusing staff members . Pt reminded that she is fully capable of completing all ADLs independently. Pt informed she cannot holler at staff, and make unreasonable demands. Pt requested this RNs supervisor. Virgil Benedict, AD aware. PA aware pt is requesting nausea meds.

## 2018-05-02 NOTE — ED Notes (Signed)
Pt placed on bedpan. PA currently at bedside.

## 2018-05-03 ENCOUNTER — Emergency Department (HOSPITAL_BASED_OUTPATIENT_CLINIC_OR_DEPARTMENT_OTHER): Payer: Self-pay

## 2018-05-03 ENCOUNTER — Other Ambulatory Visit: Payer: Self-pay

## 2018-05-03 ENCOUNTER — Emergency Department (HOSPITAL_BASED_OUTPATIENT_CLINIC_OR_DEPARTMENT_OTHER)
Admission: EM | Admit: 2018-05-03 | Discharge: 2018-05-03 | Disposition: A | Discharge status: Home / Self Care | Payer: Self-pay | Attending: Emergency Medicine | Admitting: Emergency Medicine

## 2018-05-03 DIAGNOSIS — M545 Low back pain, unspecified: Secondary | ICD-10-CM

## 2018-05-03 DIAGNOSIS — J449 Chronic obstructive pulmonary disease, unspecified: Secondary | ICD-10-CM | POA: Insufficient documentation

## 2018-05-03 DIAGNOSIS — Z79899 Other long term (current) drug therapy: Secondary | ICD-10-CM | POA: Insufficient documentation

## 2018-05-03 DIAGNOSIS — G8929 Other chronic pain: Secondary | ICD-10-CM | POA: Insufficient documentation

## 2018-05-03 DIAGNOSIS — Y906 Blood alcohol level of 120-199 mg/100 ml: Secondary | ICD-10-CM | POA: Insufficient documentation

## 2018-05-03 DIAGNOSIS — F10920 Alcohol use, unspecified with intoxication, uncomplicated: Secondary | ICD-10-CM

## 2018-05-03 DIAGNOSIS — F17210 Nicotine dependence, cigarettes, uncomplicated: Secondary | ICD-10-CM | POA: Insufficient documentation

## 2018-05-03 LAB — BASIC METABOLIC PANEL
Anion gap: 11 (ref 5–15)
BUN: <5 mg/dL — ABNORMAL LOW (ref 6–20)
CO2: 30 mmol/L (ref 22–32)
CREATININE: 0.34 mg/dL — AB (ref 0.44–1.00)
Calcium: 7.6 mg/dL — ABNORMAL LOW (ref 8.9–10.3)
Chloride: 95 mmol/L — ABNORMAL LOW (ref 98–111)
GFR calc Af Amer: >60 mL/min (ref >60)
GFR calc non Af Amer: >60 mL/min (ref >60)
Glucose, Bld: 109 mg/dL — ABNORMAL HIGH (ref 70–99)
Potassium: 3.6 mmol/L (ref 3.5–5.1)
Sodium: 136 mmol/L (ref 135–145)

## 2018-05-03 LAB — CBC WITH DIFFERENTIAL/PLATELET
Abs Immature Granulocytes: 0.02 10*3/uL (ref 0.00–0.07)
BASOS PCT: 1 %
Basophils Absolute: 0.1 10*3/uL (ref 0.0–0.1)
EOS PCT: 1 %
Eosinophils Absolute: 0.1 10*3/uL (ref 0.0–0.5)
HCT: 32.6 % — ABNORMAL LOW (ref 36.0–46.0)
Hemoglobin: 9 g/dL — ABNORMAL LOW (ref 12.0–15.0)
Immature Granulocytes: 0 %
Lymphocytes Relative: 15 %
Lymphs Abs: 0.8 10*3/uL (ref 0.7–4.0)
MCH: 22.8 pg — ABNORMAL LOW (ref 26.0–34.0)
MCHC: 27.6 g/dL — ABNORMAL LOW (ref 30.0–36.0)
MCV: 82.7 fL (ref 80.0–100.0)
Monocytes Absolute: 0.8 10*3/uL (ref 0.1–1.0)
Monocytes Relative: 14 %
Neutro Abs: 3.9 10*3/uL (ref 1.7–7.7)
Neutrophils Relative %: 69 %
Platelets: 194 10*3/uL (ref 150–400)
RBC: 3.94 MIL/uL (ref 3.87–5.11)
RDW: 21.6 % — ABNORMAL HIGH (ref 11.5–15.5)
Smear Review: NORMAL
WBC: 5.8 10*3/uL (ref 4.0–10.5)
nRBC: 0 % (ref 0.0–0.2)

## 2018-05-03 LAB — ETHANOL: ALCOHOL ETHYL (B): 138 mg/dL — AB (ref <10)

## 2018-05-03 MED ORDER — CHLORDIAZEPOXIDE HCL 25 MG PO CAPS: ORAL_CAPSULE | ORAL | 0 refills | Status: DC

## 2018-05-03 MED ORDER — ACETAMINOPHEN 325 MG PO TABS
650.0000 mg | ORAL_TABLET | Freq: Once | ORAL | Status: AC
  Administered 2018-05-03: 650 mg via ORAL
  Filled 2018-05-03: qty 2

## 2018-05-03 MED ORDER — GABAPENTIN 100 MG PO CAPS: 200.0000 mg | ORAL_CAPSULE | Freq: Three times a day (TID) | ORAL | 0 refills | Status: DC

## 2018-05-03 MED ORDER — LIDOCAINE 5 % EX PTCH: 1.0000 | MEDICATED_PATCH | CUTANEOUS | 0 refills | Status: DC

## 2018-05-03 MED ORDER — FUROSEMIDE 40 MG PO TABS
40.0000 mg | ORAL_TABLET | Freq: Every day | ORAL | Status: DC
  Administered 2018-05-03: 40 mg via ORAL
  Filled 2018-05-03 (×2): qty 1

## 2018-05-03 MED ORDER — DILTIAZEM HCL ER COATED BEADS 120 MG PO CP24
120.0000 mg | ORAL_CAPSULE | Freq: Every day | ORAL | Status: DC
  Administered 2018-05-03: 120 mg via ORAL

## 2018-05-03 MED ORDER — IPRATROPIUM-ALBUTEROL 0.5-2.5 (3) MG/3ML IN SOLN
3.0000 mL | Freq: Four times a day (QID) | RESPIRATORY_TRACT | Status: DC
  Administered 2018-05-03: 3 mL via RESPIRATORY_TRACT
  Filled 2018-05-03: qty 3

## 2018-05-03 MED ORDER — SPIRONOLACTONE 25 MG PO TABS
25.0000 mg | ORAL_TABLET | Freq: Every day | ORAL | Status: DC
  Filled 2018-05-03: qty 1

## 2018-05-03 NOTE — ED Notes (Signed)
Pt ambulated with walker and without any additional assistance. NAD noted.

## 2018-05-03 NOTE — ED Notes (Signed)
Pt given po fluids and meal.

## 2018-05-03 NOTE — ED Notes (Signed)
Pt placed on 2L via [place] due to sats dropping to 88% on RA while pt resting and mouth breathing.

## 2018-05-03 NOTE — ED Triage Notes (Addendum)
Pt with chronic back pain.  Per EMS, pt covered in feces and smells of ETOH. Per EMS, pt calls 911 upwards of 6 times per day.

## 2018-05-03 NOTE — ED Provider Notes (Signed)
[facility] EMERGENCY DEPARTMENT Provider Note   CSN: [id number] Arrival date & time: 05/03/18  1314  History   Chief Complaint Chief Complaint  Patient presents with   Back Pain    HPI Anne Black is a 58 y.o. female with past medical history significant (see below) who presents for evaluation multiple complaints.  Patient states she had a fall 2 days ago.  States she has history of frequent falls due to her alcoholism and generalized weakness.  Patient states her weakness is at baseline.  Patient was seen yesterday in the emergency department, however left AGAINST MEDICAL ADVICE at that time.  Patient was noted to have hypokalemia at 2.7.  Was given p.o. replacement in emergency department, however left prior to IV replacement for potassium.  Patient was recently hospitalized for abdominal wall cellulitis as well as A. fib with RVR in mid March.  Has been seen multiple times in the emergency department for uncomplicated alcohol intoxication since her admission. Patient states she has had diffuse muscle cramps to her lumbar spine since she fell.  She rates her pain a 4/10.  Pain does not radiate.  She denies numbness or tingling in her extremities.  Denies IV drug use, saddle paresthesia, history of malignancy.  Patient states she does have urinary as well as bowel incontinence however this is chronic at baseline.  Patient states "I wear diapers." Patient noncompliant with her medical routine.  She is generally independent however has refused home health previously.  She typically ambulates with a walker but has frequent falls secondary to her generalized weakness as well as her alcohol intoxication.  Her emergency department visit yesterday patient had imaging ordered, however left AGAINST MEDICAL ADVICE prior to imaging being obtained.  Patient states she has generalized weakness, leg swelling however patient states these are all chronic at baseline and have not changed.  Patient  states she is noncompliant with her Lasix, spironolactone, oxygen at use as well as COPD inhalers at home. Patient states that she has chronic back pain at baseline and is unsure if this is different from her "normal back pain."  History obtained from patient.  No interpreter was used.    HPI  Past Medical History:  Diagnosis Date   ADHD (attention deficit hyperactivity disorder)    Alcoholic cirrhosis (HCC)    Alcoholism (HCC)    Allergies    Anxiety    Arthritis    Ascites    Asthma    Atrial fibrillation with RVR (HCC)    Bipolar disorder (HCC)    COPD (chronic obstructive pulmonary disease) (HCC)    Depression    Dyspnea    Dysrhythmia    GERD (gastroesophageal reflux disease)    History of hiatal hernia    Hypokalemia    Migraine    Multiple sclerosis (HCC)    Narcolepsy    Osteoarthritis of knee    Osteoporosis    Pneumonia     Patient Active Problem List   Diagnosis Date Noted   Alcohol withdrawal delirium (HCC) 04/28/2018   Weakness generalized 04/27/2018   Gait abnormality 04/24/2018   Respiratory failure with hypoxia (HCC) 04/18/2018   Chronic hypoxemic respiratory failure (HCC) 04/11/2018   Atrial fibrillation with rapid ventricular response (HCC) 03/23/2018   Head injury    Fall 03/19/2018   Syncope 03/19/2018   Orbital fracture 03/19/2018   Scalp laceration 03/19/2018   Acute respiratory failure with hypoxia and hypercarbia (HCC) 03/19/2018   Abdominal tenderness 03/19/2018  Acute on chronic respiratory failure with hypoxia (HCC) 03/17/2018   Alcohol abuse with intoxication (HCC) 03/17/2018   ETOH abuse    Normocytic anemia 03/16/2018   Acute metabolic encephalopathy 03/16/2018   Lower leg edema 03/16/2018   Migraine 03/16/2018   A-fib (HCC) 02/18/2018   Community acquired pneumonia of left lower lobe of lung (HCC)    Weakness 08/21/2017   COPD exacerbation (HCC) 06/12/2017   Chest pain with  moderate risk for cardiac etiology 05/30/2017   Abnormal LFTs 05/30/2017   Tachycardia 05/30/2017   COPD (chronic obstructive pulmonary disease) (HCC) 04/19/2017   Right rib fracture 04/15/2017   Abdominal wall cellulitis 12/30/2016   S/P exploratory laparotomy 12/30/2016   Distal radius fracture, left 09/09/2016   Thrombocytopenia (HCC) 09/09/2016   Chest x-ray abnormality    Umbilical hernia without obstruction and without gangrene 05/12/2016   Itching 02/24/2016   Ventral hernia without obstruction or gangrene 02/24/2016   Palliative care encounter    Ascites    Tachypnea    Atrial flutter with rapid ventricular response (HCC)    Hypomagnesemia    Hypoxia    Dehydration 01/22/2016   Low back ache 12/30/2015   Non-intractable vomiting with nausea    Alcohol abuse with alcohol-induced mood disorder (HCC) 12/15/2015   Acute alcoholism (HCC)    Abdominal distension 11/04/2015   Alcohol abuse    Dyspnea    Acute respiratory failure (HCC) 10/15/2015   Ascites due to alcoholic cirrhosis (HCC) 10/15/2015   Osteoporosis    Multiple sclerosis (HCC)    Alcoholic cirrhosis of liver with ascites (HCC) 08/22/2015   Bipolar disorder, current episode mixed, moderate (HCC) 08/01/2015   Alcohol use disorder, severe, dependence (HCC) 07/31/2015   Macrocytic anemia- due to alcohol abuse with normal B12 & folate levels 05/31/2015   Severe protein-calorie malnutrition (HCC) 05/31/2015   Hypokalemia 05/26/2015   Encephalopathy, hepatic (HCC) 05/26/2015   Alcohol withdrawal (HCC) 05/18/2015   Abdominal pain 05/18/2015   Anxiety 05/18/2015   Stimulant abuse (HCC) 12/27/2014   Nicotine abuse 12/27/2014   Hyperprolactinemia (HCC) 11/15/2014   Dyslipidemia    Alcohol use disorder, moderate, dependence (HCC) 10/30/2014   Tobacco abuse 01/23/2014   Bipolar 1 disorder (HCC) 04/07/2013   Noncompliance with therapeutic plan 04/04/2013   Hereditary  and idiopathic peripheral neuropathy 05/01/2012   Depression 05/01/2012   GERD (gastroesophageal reflux disease) 05/01/2012   Osteoarthrosis, unspecified whether generalized or localized, involving lower leg 05/01/2012   Hyponatremia 07/01/2011    Past Surgical History:  Procedure Laterality Date   ABDOMINAL WALL DEFECT REPAIR N/A 12/30/2016   Procedure: EXPLORATORY LAPAROTOMY WITH REPAIR ABDOMINAL WALL VENTRAL HERNIA;  Surgeon: Emelia LoronWakefield, Matthew, MD;  Location[facility];  Service: General;  Laterality: N/A;   APPLICATION OF WOUND VAC N/A 12/30/2016   Procedure: APPLICATION OF WOUND VAC;  Surgeon: Emelia LoronWakefield, Matthew, MD;  Location: [facility] OR;  Service: General;  Laterality: N/A;   CESAREAN SECTION  [id number]   FRACTURE SURGERY     HERNIA REPAIR     MYRINGOTOMY WITH TUBE PLACEMENT Bilateral    ORIF WRIST FRACTURE Left 09/09/2016   Procedure: OPEN REDUCTION INTERNAL FIXATION (ORIF) LEFT WRIST FRACTURE, LEFT CARPAL TUNNEL RELEASE;  Surgeon: Dominica SeverinGramig, William, MD;  Location: [facility] ORS;  Service: Orthopedics;  Laterality: Left;   TONSILLECTOMY       OB History   No obstetric history on file.      Home Medications    Prior to Admission medications   Medication Sig Start Date  End Date Taking? Authorizing Provider  acetaminophen (TYLENOL) 500 MG tablet Take 1 tablet (500 mg total) by mouth every 6 (six) hours as needed for mild pain. Patient taking differently: Take 1,000 mg by mouth every 6 (six) hours as needed for mild pain.  03/28/18   Rolly Salter, MD  albuterol (PROVENTIL HFA;VENTOLIN HFA) 108 (90 Base) MCG/ACT inhaler Inhale 2 puffs into the lungs every 4 (four) hours as needed for wheezing or shortness of breath.    [provider]  aluminum-magnesium hydroxide-simethicone (MAALOX) 200-200-20 MG/5ML SUSP Take 30 mLs by mouth 4 (four) times daily as needed (indigestion).     [provider]  chlordiazePOXIDE (LIBRIUM) 25 MG capsule  PO TID x 1D, then 25-50mg   PO BID X 1D, then 25-50mg  PO QD X 1D 05/03/18   Simar Pothier A, PA-C  diltiazem (CARDIZEM CD) 120 MG 24 hr capsule Take 1 capsule (120 mg total) by mouth daily for 30 days. 04/14/18 05/14/18  Tegeler, Canary Brim, MD  FLUoxetine (PROZAC) 20 MG capsule Take 1 capsule (20 mg total) by mouth daily for 30 doses. Patient not taking: Reported on 04/27/2018 04/14/18 05/14/18  Tegeler, Canary Brim, MD  Fluticasone-Salmeterol [facility] INHUB) 250-50 MCG/DOSE AEPB Inhale 2 puffs into the lungs 2 (two) times daily.     [provider]  furosemide (LASIX) 40 MG tablet Take 40 mg twice daily till 04/02/2018, after that take 40 mg daily. Patient taking differently: Take 40 mg by mouth daily. Take 40 mg twice daily till 04/02/2018, after that take 40 mg daily. 04/14/18   Tegeler, Canary Brim, MD  gabapentin (NEURONTIN) 100 MG capsule Take 2 capsules (200 mg total) by mouth 3 (three) times daily for 30 days. 05/03/18 06/02/18  Genasis Zingale A, PA-C  hydrOXYzine (ATARAX/VISTARIL) 25 MG tablet Take 1 tablet (25 mg total) by mouth daily as needed for up to 30 doses (anxiety). Patient taking differently: Take 25 mg by mouth daily as needed for anxiety (withdrawal).  04/14/18   Tegeler, Canary Brim, MD  ipratropium-albuterol (DUONEB) 0.5-2.5 (3) MG/3ML SOLN Take 3 mLs by nebulization 2 (two) times daily. AND every 4 hours as needed for wheezing/shortness of breath    [provider]  lactulose (CHRONULAC) 10 GM/15ML solution Take 45 mLs (30 g total) by mouth 3 (three) times daily for 30 days. Patient not taking: Reported on 04/19/2018 04/14/18 05/14/18  Tegeler, Canary Brim, MD  lidocaine (LIDODERM) 5 % Place 1 patch onto the skin daily. Remove & Discard patch within 12 hours or as directed by MD 05/03/18   Kelce Bouton A, PA-C  magnesium oxide (MAG-OX) 400 MG tablet Take 400 mg by mouth daily.    [provider]  pantoprazole (PROTONIX) 40 MG tablet Take 1 tablet (40 mg total) by mouth daily for  30 days. 04/14/18 05/14/18  Tegeler, Canary Brim, MD  potassium chloride SA (K-DUR,KLOR-CON) 20 MEQ tablet Take 2 tablets (40 mEq total) by mouth 2 (two) times daily. 04/19/18   Bethel Born, PA-C  rizatriptan (MAXALT) 5 MG tablet Take 5 mg by mouth as needed for migraine. May repeat in 2 hours if needed; not to exceed  in 24 hours    [provider]  spironolactone (ALDACTONE) 25 MG tablet Take 1 tablet (25 mg total) by mouth daily for 30 days. 04/14/18 05/14/18  Tegeler, Canary Brim, MD    Family History Family History  Problem Relation Age of Onset   Arrhythmia Mother    Neuropathy Mother  Coronary artery disease Mother    Heart disease Mother    Heart disease Father    Hypertension Father    Heart attack Father    Multiple sclerosis Maternal Aunt     Social History Social History   Tobacco Use   Smoking status: Current Every Day Smoker    Packs/day: 1.00    Years: 35.00    Pack years: 35.00    Types: Cigarettes   Smokeless tobacco: Never Used   Tobacco comment: Pt states smoked this morning  Substance Use Topics   Alcohol use: Yes    Comment: 1 bottle of wine per day per patient.    Drug use: No     Allergies   Patient has no known allergies.   Review of Systems Review of Systems  Constitutional: Negative.   HENT: Negative.   Eyes: Negative.   Respiratory: Negative.   Cardiovascular: Positive for leg swelling. Negative for chest pain and palpitations.  Gastrointestinal: Positive for nausea. Negative for abdominal distention, abdominal pain, anal bleeding, blood in stool, constipation, diarrhea, rectal pain and vomiting.  Genitourinary: Negative.   Musculoskeletal: Positive for back pain and gait problem. Negative for arthralgias, joint swelling, myalgias, neck pain and neck stiffness.  Skin: Positive for wound.  Neurological: Positive for weakness. Negative for dizziness, tremors, seizures, syncope, facial asymmetry, speech  difficulty, light-headedness, numbness and headaches.  All other systems reviewed and are negative.  Physical Exam Updated Vital Signs BP 111/60 (BP Location: Right Arm)    Pulse 95    Temp 99.1 F (37.3 C) (Oral)    Resp 16    Ht 5\' 2"  (1.575 m)    Wt 80 kg    SpO2 97%    BMI 32.26 kg/m   Physical Exam  Physical Exam  Constitutional: Disheveled, malodorous, smells of alcohol.  No distress.  HENT:  Head: Normocephalic. Raccoon eyes, worse to left eye. Periorbital echymosis in stages of healing. Mouth/Throat: Oropharynx is clear and moist. No oropharyngeal exudate.  Eyes: Conjunctivae are normal.  Neck: Tenderness palpation to entire neck including midline.  Neck supple.  No meningismus. Cardiovascular: Normal rate, regular rhythm and intact distal pulses.   Pulmonary/Chest: Mild diffuse expiratory wheezing.  Able to speak in full sentences without difficulty.  No assesory muscle usage. Abdominal: Soft. Mildly distended abdomen without tenderness. No abd wall erythema or warmth. There is no rebound or guarding.  Well-healed, old abdominal wall midline scar.  Normal rectal tone.  Soft brown stool in vault. Performed with chaperone in room. Musculoskeletal:  Moves all 4 extremities without difficulty.  Able to sit on edge of bed and perform range of motion exercises with her back with flexion, extension, lateral rotation without difficulty.  She has no midline spinal tenderness. Mild tenderness to palpation of the paraspinous muscles of the L-spine. Negative straight leg raise. Lymphadenopathy:    Pt has no cervical adenopathy.  Neurological: Pt is alert. Pt has normal reflexes.  Reflex Scores:      Patellar reflexes are 2+ on the right side and 2+ on the left side.      Achilles reflexes are 2+ on the right side and 2+ on the left side.     No asterixis. Speech is clear. Follows commands 4+/5 strength in upper and lower extremities bilaterally including dorsiflexion and plantar flexion,  strong and equal grip strength.  No weakness.  Normal finger-to-nose, heel-to-shin.  Negative Romberg. Sensation normal to light and sharp touch Moves extremities without ataxia, coordination intact  Skin: Patient with multiple bruising in various stages of healing throughout body.  No lacerations.  No rashes.  2+ bilateral pitting edema up to mid-shin. Psychiatric: Pt has a normal mood. Flat affect. Nursing note and vitals reviewed. ED Treatments / Results  Labs (all labs ordered are listed, but only abnormal results are displayed) Labs Reviewed  CBC WITH DIFFERENTIAL/PLATELET - Abnormal; Notable for the following components:      Result Value   Hemoglobin 9.0 (*)    HCT 32.6 (*)    MCH 22.8 (*)    MCHC 27.6 (*)    RDW 21.6 (*)    All other components within normal limits  ETHANOL - Abnormal; Notable for the following components:   Alcohol, Ethyl (B) 138 (*)    All other components within normal limits  BASIC METABOLIC PANEL - Abnormal; Notable for the following components:   Chloride 95 (*)    Glucose, Bld 109 (*)    BUN <5 (*)    Creatinine, Ser 0.34 (*)    Calcium 7.6 (*)    All other components within normal limits    EKG EKG Interpretation  Date/Time:  Wednesday May 03 2018 15:23:46 EDT Ventricular Rate:  97 PR Interval:    QRS Duration: 95 QT Interval:  360 QTC Calculation: 458 R Axis:   87 Text Interpretation:  Sinus rhythm Borderline T abnormalities, anterior leads when compared to prior, no significant changes seen.  No STEMI Confirmed by Theda Belfast ([id number]) on 05/03/2018 5:10:00 PM   Radiology Dg Lumbar Spine Complete  Result Date: 05/03/2018 CLINICAL DATA:  Acute low back pain after fall several days ago. EXAM: LUMBAR SPINE - COMPLETE 4+ VIEW COMPARISON:  CT scan of April 27, 2018. FINDINGS: There is no evidence of lumbar spine fracture. Alignment is normal. Intervertebral disc spaces are maintained. IMPRESSION: Negative. Electronically Signed   By: Lupita Raider, M.D.   On: 05/03/2018 15:05   Ct Head Wo Contrast  Result Date: 05/03/2018 CLINICAL DATA:  Frequent falls.  Headache.  Head injury. EXAM: CT HEAD WITHOUT CONTRAST CT MAXILLOFACIAL WITHOUT CONTRAST CT CERVICAL SPINE WITHOUT CONTRAST TECHNIQUE: Multidetector CT imaging of the head, cervical spine, and maxillofacial structures were performed using the standard protocol without intravenous contrast. Multiplanar CT image reconstructions of the cervical spine and maxillofacial structures were also generated. COMPARISON:  CT head 04/24/2018 FINDINGS: CT HEAD FINDINGS Brain: Mild atrophy. Moderate chronic microvascular ischemic change in the white matter. No acute infarct, hemorrhage, or mass. Vascular: Negative for hyperdense vessel Skull: Negative for skull fracture Other: Right orbital floor fracture, see below CT MAXILLOFACIAL FINDINGS Osseous: Depressed fracture right orbital floor is unchanged and not acute. This appeared first on the CT face of 03/19/2018 approximately 9 mm defect in the orbital floor. No other facial fracture identified. Orbits: No orbital mass or edema. Right orbital floor fracture as above. Sinuses: Paranasal sinuses clear.  No air-fluid level. Soft tissues: No significant soft tissue contusion or mass. CT CERVICAL SPINE FINDINGS Alignment: Normal Skull base and vertebrae: Negative for fracture Soft tissues and spinal canal: Negative for mass or soft tissue contusion Disc levels:  Disc spaces maintained.  Mild facet degeneration. Upper chest: Negative Other: None IMPRESSION: 1. No acute intracranial abnormality. Atrophy and chronic microvascular ischemia. 2. Nonacute depressed fracture right orbital floor unchanged from prior studies. Paranasal sinuses clear. No acute fracture 3. Negative for cervical spine fracture. Electronically Signed   By: Marlan Palau M.D.   On: 05/03/2018 15:33  Ct Cervical Spine Wo Contrast  Result Date: 05/03/2018 CLINICAL DATA:  Frequent falls.   Headache.  Head injury. EXAM: CT HEAD WITHOUT CONTRAST CT MAXILLOFACIAL WITHOUT CONTRAST CT CERVICAL SPINE WITHOUT CONTRAST TECHNIQUE: Multidetector CT imaging of the head, cervical spine, and maxillofacial structures were performed using the standard protocol without intravenous contrast. Multiplanar CT image reconstructions of the cervical spine and maxillofacial structures were also generated. COMPARISON:  CT head 04/24/2018 FINDINGS: CT HEAD FINDINGS Brain: Mild atrophy. Moderate chronic microvascular ischemic change in the white matter. No acute infarct, hemorrhage, or mass. Vascular: Negative for hyperdense vessel Skull: Negative for skull fracture Other: Right orbital floor fracture, see below CT MAXILLOFACIAL FINDINGS Osseous: Depressed fracture right orbital floor is unchanged and not acute. This appeared first on the CT face of 03/19/2018 approximately 9 mm defect in the orbital floor. No other facial fracture identified. Orbits: No orbital mass or edema. Right orbital floor fracture as above. Sinuses: Paranasal sinuses clear.  No air-fluid level. Soft tissues: No significant soft tissue contusion or mass. CT CERVICAL SPINE FINDINGS Alignment: Normal Skull base and vertebrae: Negative for fracture Soft tissues and spinal canal: Negative for mass or soft tissue contusion Disc levels:  Disc spaces maintained.  Mild facet degeneration. Upper chest: Negative Other: None IMPRESSION: 1. No acute intracranial abnormality. Atrophy and chronic microvascular ischemia. 2. Nonacute depressed fracture right orbital floor unchanged from prior studies. Paranasal sinuses clear. No acute fracture 3. Negative for cervical spine fracture. Electronically Signed   By: Marlan Palau M.D.   On: 05/03/2018 15:33   Ct Maxillofacial Wo Contrast  Result Date: 05/03/2018 CLINICAL DATA:  Frequent falls.  Headache.  Head injury. EXAM: CT HEAD WITHOUT CONTRAST CT MAXILLOFACIAL WITHOUT CONTRAST CT CERVICAL SPINE WITHOUT CONTRAST  TECHNIQUE: Multidetector CT imaging of the head, cervical spine, and maxillofacial structures were performed using the standard protocol without intravenous contrast. Multiplanar CT image reconstructions of the cervical spine and maxillofacial structures were also generated. COMPARISON:  CT head 04/24/2018 FINDINGS: CT HEAD FINDINGS Brain: Mild atrophy. Moderate chronic microvascular ischemic change in the white matter. No acute infarct, hemorrhage, or mass. Vascular: Negative for hyperdense vessel Skull: Negative for skull fracture Other: Right orbital floor fracture, see below CT MAXILLOFACIAL FINDINGS Osseous: Depressed fracture right orbital floor is unchanged and not acute. This appeared first on the CT face of 03/19/2018 approximately 9 mm defect in the orbital floor. No other facial fracture identified. Orbits: No orbital mass or edema. Right orbital floor fracture as above. Sinuses: Paranasal sinuses clear.  No air-fluid level. Soft tissues: No significant soft tissue contusion or mass. CT CERVICAL SPINE FINDINGS Alignment: Normal Skull base and vertebrae: Negative for fracture Soft tissues and spinal canal: Negative for mass or soft tissue contusion Disc levels:  Disc spaces maintained.  Mild facet degeneration. Upper chest: Negative Other: None IMPRESSION: 1. No acute intracranial abnormality. Atrophy and chronic microvascular ischemia. 2. Nonacute depressed fracture right orbital floor unchanged from prior studies. Paranasal sinuses clear. No acute fracture 3. Negative for cervical spine fracture. Electronically Signed   By: Marlan Palau M.D.   On: 05/03/2018 15:33   Procedures Procedures (including critical care time)  Medications Ordered in ED Medications  ipratropium-albuterol (DUONEB) 0.5-2.5 (3) MG/3ML nebulizer solution 3 mL (3 mLs Nebulization Given 05/03/18 1517)  diltiazem (CARDIZEM CD) 24 hr capsule 120 mg (120 mg Oral Given 05/03/18 1558)  furosemide (LASIX) tablet 40 mg (40 mg Oral  Given 05/03/18 1558)  spironolactone (ALDACTONE) tablet 25 mg (25 mg Oral  Not Given 05/03/18 1618)  acetaminophen (TYLENOL) tablet 650 mg (650 mg Oral Given 05/03/18 1512)    Initial Impression / Assessment and Plan / ED Course  I have reviewed the triage vital signs and the nursing notes.  Pertinent labs & imaging results that were available during my care of the patient were reviewed by me and considered in my medical decision making (see chart for details).  58 year old female presents for evaluation multiple complaints.  Afebrile, nonseptic appearing.   Patient well-known to the emergency department as well as EMS.  Was seen yesterday for nausea as well as fall at [facility].  Left AGAINST MEDICAL ADVICE at that time before imaging was obtained.  Was noted to be hypokalemic at 2.7.  Left for IV supplementation.    Patient presents today for back pain.  Patient with history of chronic back pain, however unable to say if this has changed after her fall.  States she has bowel and bladder incontinence, however states this is chronic at baseline this is not new since her fall.  Normal rectal tone.  Normal musculoskeletal exam.  Normal neurologic exam without neurologic deficits.  Negative straight leg raise.  Denies history of IV drug use, malignancy, fevers, radiation of pain.  Low suspicion for cauda equina, otitis, osteomyelitis, transverse myelitis, Guillain-Barr, septic joint, traumatic spinal injury as she has no tenderness to midline spine or neck.  Will order imaging, basic labs and reevaluate.  Patient was admitted to the hospital 2 weeks ago for hypokalemia as well as general deconditioning. Overall noncompliant medications at home. On 2-3 L nasal cannula at baseline.  Lungs with mild expiratory wheeze.  No rales.  Patient does not appear to be overly fluid overloaded. Patient not using nebulizers or albuterol therapy at home.  Will provide DuoNeb.  She does not have any evidence of hypoxia,  tachypnea or tachycardia.  Denies chest pain or shortness of breath.  Low suspicion for ACS, PE.  Likely patient's COPD.  In her medical noncompliance, this is likely chronic in nature.  She does not exhibit any signs of COPD exacerbation on exam.  Abdomen with mild distention.  No abdominal wall erythema or warmth.  Abdomen nontender.  No peritoneal signs.  No fever or leukocytosis to suggest SBP or recurrence of her abdominal wall cellulitis.  Urinating without difficulty in department.  1510: Patient states "Im fine, I just want mashed potatoes." Imaging pending at this time.   Labs personally reviewed: CBC without leukocytosis, Hgb 9.0, improved from previous 8.9, 8.1 Metabolic panel without electrolyte, renal or liver abnormality.  Potassium significantly improved since yesterday currently at 3.6. Alcohol: 138--- Does not appear to be in active withdrawal at this time.  Labs are at patient's baseline.  Imaging personally reviewed:  CT head-negative CT cervical- negative CT max/face-old orbital wall fracture similar from previous CT.  No new fractures. DG lumbar-No acute findings.  Patient has no red flag symptoms.  Do not feel she needs MRI for her chronic back pain.  EKG personally reviewed: No ST/T changes.  No evidence of atrial fibrillation.  Patient has had meal in emergency department, tolerated well.  Patient without complaints.  She has been given her home medications.  Requesting DC home at this time.  Patient's back pain likely chronic in nature.  Patient has been ambulated in department with her walker which she uses at baseline.  Oxygen saturation greater than 95% on 2 L nasal cannula which she uses at baseline at home.  She  is clinically sober.  Will DC home at this time.  She is hemodynamically stable.  Discussed return precautions.  Patient voiced understanding and is agreeable to follow-up.  Given Librium taper as well as resources for her alcohol abuse.     Final Clinical  Impressions(s) / ED Diagnoses   Final diagnoses:  Chronic bilateral low back pain without sciatica  Alcoholic intoxication without complication [facility])    ED Discharge Orders         Ordered    chlordiazePOXIDE (LIBRIUM) 25 MG capsule     05/03/18 1712    lidocaine (LIDODERM) 5 %  Every 24 hours     05/03/18 1712    gabapentin (NEURONTIN) 100 MG capsule  3 times daily     05/03/18 1718           Viraj Liby A, PA-C 05/03/18 1740    Alvira Monday, MD 05/06/18 [phone]

## 2018-05-03 NOTE — ED Notes (Signed)
Pt asking multiple times for something to eat. Pt sts she has food at home but has not eaten today.

## 2018-05-03 NOTE — Discharge Instructions (Addendum)
You were evaluated today for back pain. Follow up with PCP for reevaluation. I have given you a prescription for Lidoderm patches. Take as prescribed.  Return to the ED for any new or worsening symptoms.

## 2018-05-03 NOTE — ED Notes (Addendum)
Pt admits to 1/2 beer today, uses a walker, cane, wheelchair, lives alone.  Pt sts she has fallen a lot.  Pt sts she is out of her anti-inflammatory, Mobic.  Pt had dried feces on lower body. Pt was cleansed and placed in clean gown.

## 2018-05-05 ENCOUNTER — Other Ambulatory Visit: Payer: Self-pay

## 2018-05-05 ENCOUNTER — Inpatient Hospital Stay (HOSPITAL_COMMUNITY)
Admission: EM | Admit: 2018-05-05 | Discharge: 2018-05-07 | DRG: 894 | Discharge status: Against Medical Advice | Payer: Medicare Other | Attending: Internal Medicine | Admitting: Internal Medicine

## 2018-05-05 ENCOUNTER — Emergency Department (HOSPITAL_COMMUNITY): Payer: Medicare Other

## 2018-05-05 ENCOUNTER — Encounter (HOSPITAL_COMMUNITY): Payer: Self-pay | Admitting: Emergency Medicine

## 2018-05-05 DIAGNOSIS — R112 Nausea with vomiting, unspecified: Secondary | ICD-10-CM

## 2018-05-05 DIAGNOSIS — Z820 Family history of epilepsy and other diseases of the nervous system: Secondary | ICD-10-CM

## 2018-05-05 DIAGNOSIS — E878 Other disorders of electrolyte and fluid balance, not elsewhere classified: Secondary | ICD-10-CM | POA: Diagnosis present

## 2018-05-05 DIAGNOSIS — G43909 Migraine, unspecified, not intractable, without status migrainosus: Secondary | ICD-10-CM | POA: Diagnosis present

## 2018-05-05 DIAGNOSIS — Z9181 History of falling: Secondary | ICD-10-CM

## 2018-05-05 DIAGNOSIS — G35 Multiple sclerosis: Secondary | ICD-10-CM | POA: Diagnosis present

## 2018-05-05 DIAGNOSIS — F10229 Alcohol dependence with intoxication, unspecified: Principal | ICD-10-CM | POA: Diagnosis present

## 2018-05-05 DIAGNOSIS — F3162 Bipolar disorder, current episode mixed, moderate: Secondary | ICD-10-CM | POA: Diagnosis present

## 2018-05-05 DIAGNOSIS — J449 Chronic obstructive pulmonary disease, unspecified: Secondary | ICD-10-CM | POA: Diagnosis present

## 2018-05-05 DIAGNOSIS — F1020 Alcohol dependence, uncomplicated: Secondary | ICD-10-CM | POA: Diagnosis present

## 2018-05-05 DIAGNOSIS — I4891 Unspecified atrial fibrillation: Secondary | ICD-10-CM

## 2018-05-05 DIAGNOSIS — Z9119 Patient's noncompliance with other medical treatment and regimen: Secondary | ICD-10-CM

## 2018-05-05 DIAGNOSIS — Z720 Tobacco use: Secondary | ICD-10-CM | POA: Diagnosis present

## 2018-05-05 DIAGNOSIS — R197 Diarrhea, unspecified: Secondary | ICD-10-CM

## 2018-05-05 DIAGNOSIS — F17210 Nicotine dependence, cigarettes, uncomplicated: Secondary | ICD-10-CM | POA: Diagnosis present

## 2018-05-05 DIAGNOSIS — K7031 Alcoholic cirrhosis of liver with ascites: Secondary | ICD-10-CM | POA: Diagnosis present

## 2018-05-05 DIAGNOSIS — Z8249 Family history of ischemic heart disease and other diseases of the circulatory system: Secondary | ICD-10-CM

## 2018-05-05 DIAGNOSIS — I480 Paroxysmal atrial fibrillation: Secondary | ICD-10-CM | POA: Diagnosis present

## 2018-05-05 DIAGNOSIS — F909 Attention-deficit hyperactivity disorder, unspecified type: Secondary | ICD-10-CM | POA: Diagnosis present

## 2018-05-05 DIAGNOSIS — Z79899 Other long term (current) drug therapy: Secondary | ICD-10-CM

## 2018-05-05 DIAGNOSIS — I4892 Unspecified atrial flutter: Secondary | ICD-10-CM | POA: Diagnosis present

## 2018-05-05 DIAGNOSIS — I509 Heart failure, unspecified: Secondary | ICD-10-CM | POA: Diagnosis present

## 2018-05-05 DIAGNOSIS — E876 Hypokalemia: Secondary | ICD-10-CM

## 2018-05-05 LAB — COMPREHENSIVE METABOLIC PANEL
ALT: 25 U/L (ref 0–44)
AST: 47 U/L — ABNORMAL HIGH (ref 15–41)
Albumin: 3.3 g/dL — ABNORMAL LOW (ref 3.5–5.0)
Alkaline Phosphatase: 105 U/L (ref 38–126)
Anion gap: 14 (ref 5–15)
BILIRUBIN TOTAL: 1.4 mg/dL — AB (ref 0.3–1.2)
BUN: <5 mg/dL — ABNORMAL LOW (ref 6–20)
CO2: 39 mmol/L — ABNORMAL HIGH (ref 22–32)
Calcium: 7.1 mg/dL — ABNORMAL LOW (ref 8.9–10.3)
Chloride: 86 mmol/L — ABNORMAL LOW (ref 98–111)
Creatinine, Ser: 0.44 mg/dL (ref 0.44–1.00)
GFR calc Af Amer: >60 mL/min (ref >60)
GFR calc non Af Amer: >60 mL/min (ref >60)
Glucose, Bld: 108 mg/dL — ABNORMAL HIGH (ref 70–99)
Potassium: <2 mmol/L — CL (ref 3.5–5.1)
Sodium: 139 mmol/L (ref 135–145)
Total Protein: 6.7 g/dL (ref 6.5–8.1)

## 2018-05-05 LAB — BASIC METABOLIC PANEL WITH GFR
Anion gap: 11 (ref 5–15)
BUN: <5 mg/dL — ABNORMAL LOW (ref 6–20)
CO2: 38 mmol/L — ABNORMAL HIGH (ref 22–32)
Calcium: 6.7 mg/dL — ABNORMAL LOW (ref 8.9–10.3)
Chloride: 87 mmol/L — ABNORMAL LOW (ref 98–111)
Creatinine, Ser: 0.41 mg/dL — ABNORMAL LOW (ref 0.44–1.00)
GFR calc Af Amer: >60 mL/min
GFR calc non Af Amer: >60 mL/min
Glucose, Bld: 99 mg/dL (ref 70–99)
Potassium: 2.3 mmol/L — CL (ref 3.5–5.1)
Sodium: 136 mmol/L (ref 135–145)

## 2018-05-05 LAB — CBC WITH DIFFERENTIAL/PLATELET
Abs Immature Granulocytes: 0.01 10*3/uL (ref 0.00–0.07)
Basophils Absolute: 0.1 10*3/uL (ref 0.0–0.1)
Basophils Relative: 2 %
EOS PCT: 2 %
Eosinophils Absolute: 0.1 10*3/uL (ref 0.0–0.5)
HEMATOCRIT: 34.1 % — AB (ref 36.0–46.0)
HEMOGLOBIN: 9.7 g/dL — AB (ref 12.0–15.0)
Immature Granulocytes: 0 %
Lymphocytes Relative: 22 %
Lymphs Abs: 0.8 10*3/uL (ref 0.7–4.0)
MCH: 23 pg — ABNORMAL LOW (ref 26.0–34.0)
MCHC: 28.4 g/dL — ABNORMAL LOW (ref 30.0–36.0)
MCV: 80.8 fL (ref 80.0–100.0)
MONO ABS: 0.8 10*3/uL (ref 0.1–1.0)
Monocytes Relative: 22 %
Neutro Abs: 2.1 10*3/uL (ref 1.7–7.7)
Neutrophils Relative %: 52 %
Platelets: 175 10*3/uL (ref 150–400)
RBC: 4.22 MIL/uL (ref 3.87–5.11)
RDW: 21.2 % — ABNORMAL HIGH (ref 11.5–15.5)
WBC: 3.9 10*3/uL — ABNORMAL LOW (ref 4.0–10.5)
nRBC: 0 % (ref 0.0–0.2)

## 2018-05-05 LAB — MAGNESIUM
Magnesium: 1.2 mg/dL — ABNORMAL LOW (ref 1.7–2.4)
Magnesium: 1.7 mg/dL (ref 1.7–2.4)

## 2018-05-05 LAB — PROTIME-INR
INR: 1.5 — ABNORMAL HIGH (ref 0.8–1.2)
Prothrombin Time: 17.7 seconds — ABNORMAL HIGH (ref 11.4–15.2)

## 2018-05-05 LAB — LIPASE, BLOOD: Lipase: 24 U/L (ref 11–51)

## 2018-05-05 LAB — MRSA PCR SCREENING: MRSA by PCR: NEGATIVE

## 2018-05-05 MED ORDER — ENOXAPARIN SODIUM 40 MG/0.4ML ~~LOC~~ SOLN
40.0000 mg | SUBCUTANEOUS | Status: DC
  Administered 2018-05-05 – 2018-05-06 (×2): 40 mg via SUBCUTANEOUS
  Filled 2018-05-05 (×2): qty 0.4

## 2018-05-05 MED ORDER — ALBUTEROL SULFATE (2.5 MG/3ML) 0.083% IN NEBU: 3.0000 mL | INHALATION_SOLUTION | RESPIRATORY_TRACT | Status: DC | PRN

## 2018-05-05 MED ORDER — LORAZEPAM 2 MG/ML IJ SOLN
1.0000 mg | INTRAMUSCULAR | Status: DC | PRN
  Administered 2018-05-05 – 2018-05-06 (×3): 1 mg via INTRAVENOUS
  Filled 2018-05-05 (×2): qty 1

## 2018-05-05 MED ORDER — SUMATRIPTAN SUCCINATE 50 MG PO TABS
50.0000 mg | ORAL_TABLET | ORAL | Status: DC | PRN
  Administered 2018-05-05 – 2018-05-06 (×2): 50 mg via ORAL
  Filled 2018-05-05 (×5): qty 1

## 2018-05-05 MED ORDER — IPRATROPIUM-ALBUTEROL 20-100 MCG/ACT IN AERS: 1.0000 | INHALATION_SPRAY | Freq: Four times a day (QID) | RESPIRATORY_TRACT | Status: DC

## 2018-05-05 MED ORDER — MAGNESIUM OXIDE 400 (241.3 MG) MG PO TABS
400.0000 mg | ORAL_TABLET | Freq: Every day | ORAL | Status: DC
  Administered 2018-05-05 – 2018-05-06 (×2): 400 mg via ORAL
  Filled 2018-05-05 (×2): qty 1

## 2018-05-05 MED ORDER — ACETAMINOPHEN 325 MG PO TABS
650.0000 mg | ORAL_TABLET | ORAL | Status: DC | PRN
  Administered 2018-05-05 – 2018-05-07 (×4): 650 mg via ORAL
  Filled 2018-05-05 (×5): qty 2

## 2018-05-05 MED ORDER — LORAZEPAM 1 MG PO TABS
1.0000 mg | ORAL_TABLET | Freq: Four times a day (QID) | ORAL | Status: DC | PRN
  Administered 2018-05-05 – 2018-05-06 (×3): 1 mg via ORAL
  Filled 2018-05-05 (×3): qty 1

## 2018-05-05 MED ORDER — ONDANSETRON HCL 4 MG/2ML IJ SOLN
4.0000 mg | Freq: Once | INTRAMUSCULAR | Status: AC
  Administered 2018-05-05: 4 mg via INTRAVENOUS
  Filled 2018-05-05: qty 2

## 2018-05-05 MED ORDER — POTASSIUM CHLORIDE 10 MEQ/100ML IV SOLN
10.0000 meq | Freq: Once | INTRAVENOUS | Status: AC
  Administered 2018-05-05: 10 meq via INTRAVENOUS
  Filled 2018-05-05: qty 100

## 2018-05-05 MED ORDER — POTASSIUM CHLORIDE CRYS ER 20 MEQ PO TBCR
40.0000 meq | EXTENDED_RELEASE_TABLET | Freq: Once | ORAL | Status: AC
  Administered 2018-05-05: 40 meq via ORAL
  Filled 2018-05-05: qty 2

## 2018-05-05 MED ORDER — FOLIC ACID 1 MG PO TABS
1.0000 mg | ORAL_TABLET | Freq: Every day | ORAL | Status: DC
  Administered 2018-05-05 – 2018-05-06 (×2): 1 mg via ORAL
  Filled 2018-05-05 (×2): qty 1

## 2018-05-05 MED ORDER — LACTULOSE 10 GM/15ML PO SOLN
30.0000 g | Freq: Three times a day (TID) | ORAL | Status: DC
  Filled 2018-05-05 (×3): qty 45

## 2018-05-05 MED ORDER — GABAPENTIN 100 MG PO CAPS
200.0000 mg | ORAL_CAPSULE | Freq: Three times a day (TID) | ORAL | Status: DC
  Administered 2018-05-05 – 2018-05-06 (×6): 200 mg via ORAL
  Filled 2018-05-05 (×6): qty 2

## 2018-05-05 MED ORDER — IPRATROPIUM-ALBUTEROL 0.5-2.5 (3) MG/3ML IN SOLN: 3.0000 mL | Freq: Four times a day (QID) | RESPIRATORY_TRACT | Status: DC

## 2018-05-05 MED ORDER — MAGNESIUM OXIDE 400 (241.3 MG) MG PO TABS
400.0000 mg | ORAL_TABLET | Freq: Every day | ORAL | Status: DC
  Filled 2018-05-05: qty 1

## 2018-05-05 MED ORDER — DILTIAZEM LOAD VIA INFUSION
10.0000 mg | Freq: Once | INTRAVENOUS | Status: AC
  Administered 2018-05-05: 10 mg via INTRAVENOUS
  Filled 2018-05-05: qty 10

## 2018-05-05 MED ORDER — PANTOPRAZOLE SODIUM 40 MG PO TBEC
40.0000 mg | DELAYED_RELEASE_TABLET | Freq: Every day | ORAL | Status: DC
  Administered 2018-05-05 – 2018-05-06 (×2): 40 mg via ORAL
  Filled 2018-05-05 (×2): qty 1

## 2018-05-05 MED ORDER — ADULT MULTIVITAMIN W/MINERALS CH
1.0000 | ORAL_TABLET | Freq: Every day | ORAL | Status: DC
  Administered 2018-05-05 – 2018-05-06 (×2): 1 via ORAL
  Filled 2018-05-05 (×2): qty 1

## 2018-05-05 MED ORDER — POTASSIUM CHLORIDE CRYS ER 20 MEQ PO TBCR
40.0000 meq | EXTENDED_RELEASE_TABLET | Freq: Two times a day (BID) | ORAL | Status: DC
  Administered 2018-05-05 – 2018-05-06 (×4): 40 meq via ORAL
  Filled 2018-05-05 (×4): qty 2

## 2018-05-05 MED ORDER — SPIRONOLACTONE 25 MG PO TABS: 25.0000 mg | ORAL_TABLET | Freq: Every day | ORAL | Status: DC

## 2018-05-05 MED ORDER — IPRATROPIUM-ALBUTEROL 0.5-2.5 (3) MG/3ML IN SOLN
3.0000 mL | Freq: Two times a day (BID) | RESPIRATORY_TRACT | Status: DC
  Administered 2018-05-06 – 2018-05-07 (×3): 3 mL via RESPIRATORY_TRACT
  Filled 2018-05-05 (×4): qty 3

## 2018-05-05 MED ORDER — MOMETASONE FURO-FORMOTEROL FUM 200-5 MCG/ACT IN AERO
2.0000 | INHALATION_SPRAY | Freq: Two times a day (BID) | RESPIRATORY_TRACT | Status: DC
  Administered 2018-05-06 – 2018-05-07 (×3): 2 via RESPIRATORY_TRACT
  Filled 2018-05-05: qty 8.8

## 2018-05-05 MED ORDER — ONDANSETRON HCL 4 MG/2ML IJ SOLN
4.0000 mg | Freq: Four times a day (QID) | INTRAMUSCULAR | Status: DC | PRN
  Administered 2018-05-05 – 2018-05-06 (×4): 4 mg via INTRAVENOUS
  Filled 2018-05-05 (×4): qty 2

## 2018-05-05 MED ORDER — DILTIAZEM HCL ER COATED BEADS 120 MG PO CP24
120.0000 mg | ORAL_CAPSULE | Freq: Every day | ORAL | Status: DC
  Administered 2018-05-05 – 2018-05-06 (×2): 120 mg via ORAL
  Filled 2018-05-05 (×2): qty 1

## 2018-05-05 MED ORDER — DILTIAZEM HCL 100 MG IV SOLR
5.0000 mg/h | INTRAVENOUS | Status: DC
  Administered 2018-05-05: 5 mg/h via INTRAVENOUS
  Filled 2018-05-05: qty 100

## 2018-05-05 MED ORDER — POTASSIUM CHLORIDE 10 MEQ/100ML IV SOLN
10.0000 meq | INTRAVENOUS | Status: AC
  Administered 2018-05-05 (×4): 10 meq via INTRAVENOUS
  Filled 2018-05-05 (×4): qty 100

## 2018-05-05 MED ORDER — LORAZEPAM 2 MG/ML IJ SOLN
1.0000 mg | Freq: Four times a day (QID) | INTRAMUSCULAR | Status: DC | PRN
  Filled 2018-05-05: qty 1

## 2018-05-05 MED ORDER — POTASSIUM CHLORIDE IN NACL 20-0.9 MEQ/L-% IV SOLN
INTRAVENOUS | Status: DC
  Administered 2018-05-05 – 2018-05-06 (×2): via INTRAVENOUS
  Filled 2018-05-05 (×5): qty 1000

## 2018-05-05 MED ORDER — MAGNESIUM SULFATE 2 GM/50ML IV SOLN
2.0000 g | Freq: Once | INTRAVENOUS | Status: AC
  Administered 2018-05-05: 2 g via INTRAVENOUS
  Filled 2018-05-05: qty 50

## 2018-05-05 MED ORDER — THIAMINE HCL 100 MG/ML IJ SOLN: 100.0000 mg | Freq: Every day | INTRAMUSCULAR | Status: DC

## 2018-05-05 MED ORDER — SODIUM CHLORIDE 0.9 % IV SOLN: INTRAVENOUS | Status: DC

## 2018-05-05 MED ORDER — HYDROXYZINE HCL 50 MG/ML IM SOLN
25.0000 mg | Freq: Four times a day (QID) | INTRAMUSCULAR | Status: DC | PRN
  Filled 2018-05-05: qty 0.5

## 2018-05-05 MED ORDER — VITAMIN B-1 100 MG PO TABS
100.0000 mg | ORAL_TABLET | Freq: Every day | ORAL | Status: DC
  Administered 2018-05-05 – 2018-05-06 (×2): 100 mg via ORAL
  Filled 2018-05-05 (×2): qty 1

## 2018-05-05 NOTE — ED Notes (Signed)
EKG given to MD Pickering  

## 2018-05-05 NOTE — ED Notes (Signed)
ED TO INPATIENT HANDOFF REPORT  ED Nurse Name and Phone #: [phone], [place]ps Name/Age/Gender Anne Black 58 y.o. female Room/Bed: WA15/WA15  Code Status   Code Status: Full Code  Home/SNF/Other Home Patient oriented to: self, place, time and situation Is this baseline? Yes   Triage Complete: Triage complete  Chief Complaint migraine;emesis  Triage Note Patient here from home via EMS with complaints of headache, n/v that started last night. CBG 135. EMS reports O2 89% on arrival, oxygen given via [place], O2 up to 94% 2L.    Allergies No Known Allergies  Level of Care/Admitting Diagnosis ED Disposition    ED Disposition Condition Comment   Admit  Hospital Area: [facility] [[id number]]  Level of Care: Stepdown [14]  Admit to SDU based on following criteria: Severe physiological/psychological symptoms:  Any diagnosis requiring assessment & intervention at least every 4 hours on an ongoing basis to obtain desired patient outcomes including stability and rehabilitation  Diagnosis: Alcohol dependence (HCC) [458099]  Admitting Physician: Jonah Blue [[id number]]  Attending Physician: Jonah Blue [[id number]]  PT Class (Do Not Modify): Observation [104]  PT Acc Code (Do Not Modify): Observation [10022]       B Medical/Surgery History Past Medical History:  Diagnosis Date  . ADHD (attention deficit hyperactivity disorder)   . Alcoholic cirrhosis (HCC)    with ascites  . Alcoholism (HCC)   . Allergies   . Anxiety   . Arthritis   . Atrial fibrillation with RVR (HCC)   . Bipolar disorder (HCC)   . COPD (chronic obstructive pulmonary disease) (HCC)    wears 2L chronic O2  . Dyspnea   . GERD (gastroesophageal reflux disease)   . History of hiatal hernia   . Hypokalemia   . Migraine   . Multiple sclerosis (HCC)   . Narcolepsy   . Osteoarthritis of knee   . Osteoporosis   . Pneumonia    Past Surgical History:  Procedure Laterality Date  . ABDOMINAL  WALL DEFECT REPAIR N/A 12/30/2016   Procedure: EXPLORATORY LAPAROTOMY WITH REPAIR ABDOMINAL WALL VENTRAL HERNIA;  Surgeon: [name], MD;  Location: [facility] OR;  Service: General;  Laterality: N/A;  . APPLICATION OF WOUND VAC N/A 12/30/2016   Procedure: APPLICATION OF WOUND VAC;  Surgeon: [name], MD;  Location: [facility] OR;  Service: General;  Laterality: N/A;  . CESAREAN SECTION  [phone]  . FRACTURE SURGERY    . HERNIA REPAIR    . MYRINGOTOMY WITH TUBE PLACEMENT Bilateral   . ORIF WRIST FRACTURE Left 09/09/2016   Procedure: OPEN REDUCTION INTERNAL FIXATION (ORIF) LEFT WRIST FRACTURE, LEFT CARPAL TUNNEL RELEASE;  Surgeon: Dominica Severin, MD;  Location: [facility] ORS;  Service: Orthopedics;  Laterality: Left;  . TONSILLECTOMY       A IV Location/Drains/Wounds Patient Lines/Drains/Airways Status   Active Line/Drains/Airways    Name:   Placement date:   Placement time:   Site:   Days:   Peripheral IV 05/05/18 Right;Posterior Arm   05/05/18    0745    Arm   less than 1   Peripheral IV 05/05/18 Right;Upper Arm   05/05/18    0930    Arm   less than 1   External Urinary Catheter   04/24/18    1842    -   11          Intake/Output Last 24 hours  Intake/Output Summary (Last 24 hours) at 05/05/2018 1041 Last data filed at 05/05/2018 1025  Gross per 24 hour  Intake 100 ml  Output -  Net 100 ml    Labs/Imaging Results for orders placed or performed during the hospital encounter of 05/05/18 (from the past 48 hour(s))  Comprehensive metabolic panel     Status: Abnormal   Collection Time: 05/05/18  7:03 AM  Result Value Ref Range   Sodium 139 135 - 145 mmol/L   Potassium <2.0 (LL) 3.5 - 5.1 mmol/L    Comment: CRITICAL RESULT CALLED TO, READ BACK BY AND VERIFIED WITH: SIMPSON,C. RN AT 9371 05/05/18 MULLINS,T    Chloride 86 (L) 98 - 111 mmol/L   CO2 39 (H) 22 - 32 mmol/L   Glucose, Bld 108 (H) 70 - 99 mg/dL   BUN <5 (L) 6 - 20 mg/dL   Creatinine, Ser 6.96 0.44 - 1.00 mg/dL    Calcium 7.1 (L) 8.9 - 10.3 mg/dL   Total Protein 6.7 6.5 - 8.1 g/dL   Albumin 3.3 (L) 3.5 - 5.0 g/dL   AST 47 (H) 15 - 41 U/L   ALT 25 0 - 44 U/L   Alkaline Phosphatase 105 38 - 126 U/L   Total Bilirubin 1.4 (H) 0.3 - 1.2 mg/dL   GFR calc non Af Amer >60 >60 mL/min   GFR calc Af Amer >60 >60 mL/min   Anion gap 14 5 - 15    Comment: Performed at [facility], [address], [place], [place] [address]  Lipase, blood     Status: None   Collection Time: 05/05/18  7:03 AM  Result Value Ref Range   Lipase 24 11 - 51 U/L    Comment: Performed at [facility], [address], [place], [place] [address]  CBC with Differential     Status: Abnormal   Collection Time: 05/05/18  7:03 AM  Result Value Ref Range   WBC 3.9 (L) 4.0 - 10.5 K/uL   RBC 4.22 3.87 - 5.11 MIL/uL   Hemoglobin 9.7 (L) 12.0 - 15.0 g/dL   HCT 10.2 (L) 58.5 - 27.7 %   MCV 80.8 80.0 - 100.0 fL   MCH 23.0 (L) 26.0 - 34.0 pg   MCHC 28.4 (L) 30.0 - 36.0 g/dL   RDW 82.4 (H) 23.5 - 36.1 %   Platelets 175 150 - 400 K/uL   nRBC 0.0 0.0 - 0.2 %   Neutrophils Relative % 52 %   Neutro Abs 2.1 1.7 - 7.7 K/uL   Lymphocytes Relative 22 %   Lymphs Abs 0.8 0.7 - 4.0 K/uL   Monocytes Relative 22 %   Monocytes Absolute 0.8 0.1 - 1.0 K/uL   Eosinophils Relative 2 %   Eosinophils Absolute 0.1 0.0 - 0.5 K/uL   Basophils Relative 2 %   Basophils Absolute 0.1 0.0 - 0.1 K/uL   RBC Morphology TARGET CELLS    Immature Granulocytes 0 %   Abs Immature Granulocytes 0.01 0.00 - 0.07 K/uL   Tear Drop Cells PRESENT     Comment: Performed at [facility], [address], [place], [place] [address]  Protime-INR     Status: Abnormal   Collection Time: 05/05/18  7:03 AM  Result Value Ref Range   Prothrombin Time 17.7 (H) 11.4 - 15.2 seconds   INR 1.5 (H) 0.8 - 1.2    Comment: (NOTE) INR goal varies based on device and disease states. Performed at [facility], [address] [place], [place] [address]   Magnesium  Status: Abnormal   Collection Time: 05/05/18  7:03 AM  Result Value Ref Range   Magnesium 1.2 (L) 1.7 - 2.4 mg/dL    Comment: Performed at [facility], [address], [place], [place] [address]   Dg Lumbar Spine Complete  Result Date: 05/03/2018 CLINICAL DATA:  Acute low back pain after fall several days ago. EXAM: LUMBAR SPINE - COMPLETE 4+ VIEW COMPARISON:  CT scan of April 27, 2018. FINDINGS: There is no evidence of lumbar spine fracture. Alignment is normal. Intervertebral disc spaces are maintained. IMPRESSION: Negative. Electronically Signed   By: Lupita Raider, M.D.   On: 05/03/2018 15:05   Ct Head Wo Contrast  Result Date: 05/03/2018 CLINICAL DATA:  Frequent falls.  Headache.  Head injury. EXAM: CT HEAD WITHOUT CONTRAST CT MAXILLOFACIAL WITHOUT CONTRAST CT CERVICAL SPINE WITHOUT CONTRAST TECHNIQUE: Multidetector CT imaging of the head, cervical spine, and maxillofacial structures were performed using the standard protocol without intravenous contrast. Multiplanar CT image reconstructions of the cervical spine and maxillofacial structures were also generated. COMPARISON:  CT head 04/24/2018 FINDINGS: CT HEAD FINDINGS Brain: Mild atrophy. Moderate chronic microvascular ischemic change in the white matter. No acute infarct, hemorrhage, or mass. Vascular: Negative for hyperdense vessel Skull: Negative for skull fracture Other: Right orbital floor fracture, see below CT MAXILLOFACIAL FINDINGS Osseous: Depressed fracture right orbital floor is unchanged and not acute. This appeared first on the CT face of 03/19/2018 approximately 9 mm defect in the orbital floor. No other facial fracture identified. Orbits: No orbital mass or edema. Right orbital floor fracture as above. Sinuses: Paranasal sinuses clear.  No air-fluid level. Soft tissues: No significant soft tissue contusion or mass. CT CERVICAL SPINE FINDINGS Alignment:  Normal Skull base and vertebrae: Negative for fracture Soft tissues and spinal canal: Negative for mass or soft tissue contusion Disc levels:  Disc spaces maintained.  Mild facet degeneration. Upper chest: Negative Other: None IMPRESSION: 1. No acute intracranial abnormality. Atrophy and chronic microvascular ischemia. 2. Nonacute depressed fracture right orbital floor unchanged from prior studies. Paranasal sinuses clear. No acute fracture 3. Negative for cervical spine fracture. Electronically Signed   By: Marlan Palau M.D.   On: 05/03/2018 15:33   Ct Cervical Spine Wo Contrast  Result Date: 05/03/2018 CLINICAL DATA:  Frequent falls.  Headache.  Head injury. EXAM: CT HEAD WITHOUT CONTRAST CT MAXILLOFACIAL WITHOUT CONTRAST CT CERVICAL SPINE WITHOUT CONTRAST TECHNIQUE: Multidetector CT imaging of the head, cervical spine, and maxillofacial structures were performed using the standard protocol without intravenous contrast. Multiplanar CT image reconstructions of the cervical spine and maxillofacial structures were also generated. COMPARISON:  CT head 04/24/2018 FINDINGS: CT HEAD FINDINGS Brain: Mild atrophy. Moderate chronic microvascular ischemic change in the white matter. No acute infarct, hemorrhage, or mass. Vascular: Negative for hyperdense vessel Skull: Negative for skull fracture Other: Right orbital floor fracture, see below CT MAXILLOFACIAL FINDINGS Osseous: Depressed fracture right orbital floor is unchanged and not acute. This appeared first on the CT face of 03/19/2018 approximately 9 mm defect in the orbital floor. No other facial fracture identified. Orbits: No orbital mass or edema. Right orbital floor fracture as above. Sinuses: Paranasal sinuses clear.  No air-fluid level. Soft tissues: No significant soft tissue contusion or mass. CT CERVICAL SPINE FINDINGS Alignment: Normal Skull base and vertebrae: Negative for fracture Soft tissues and spinal canal: Negative for mass or soft tissue  contusion Disc levels:  Disc spaces maintained.  Mild facet degeneration. Upper chest: Negative Other: None IMPRESSION:  1. No acute intracranial abnormality. Atrophy and chronic microvascular ischemia. 2. Nonacute depressed fracture right orbital floor unchanged from prior studies. Paranasal sinuses clear. No acute fracture 3. Negative for cervical spine fracture. Electronically Signed   By: Marlan Palau M.D.   On: 05/03/2018 15:33   Dg Abdomen Acute W/chest  Result Date: 05/05/2018 CLINICAL DATA:  Vomiting, acute generalized abdominal pain. EXAM: DG ABDOMEN ACUTE W/ 1V CHEST COMPARISON:  CT scan of April 27, 2018. FINDINGS: There is no evidence of dilated bowel loops or free intraperitoneal air. No radiopaque calculi or other significant radiographic abnormality is seen. Mild cardiomegaly is noted. Both lungs are clear. IMPRESSION: No evidence of bowel obstruction or ileus. No acute cardiopulmonary disease. Electronically Signed   By: Lupita Raider, M.D.   On: 05/05/2018 08:48   Ct Maxillofacial Wo Contrast  Result Date: 05/03/2018 CLINICAL DATA:  Frequent falls.  Headache.  Head injury. EXAM: CT HEAD WITHOUT CONTRAST CT MAXILLOFACIAL WITHOUT CONTRAST CT CERVICAL SPINE WITHOUT CONTRAST TECHNIQUE: Multidetector CT imaging of the head, cervical spine, and maxillofacial structures were performed using the standard protocol without intravenous contrast. Multiplanar CT image reconstructions of the cervical spine and maxillofacial structures were also generated. COMPARISON:  CT head 04/24/2018 FINDINGS: CT HEAD FINDINGS Brain: Mild atrophy. Moderate chronic microvascular ischemic change in the white matter. No acute infarct, hemorrhage, or mass. Vascular: Negative for hyperdense vessel Skull: Negative for skull fracture Other: Right orbital floor fracture, see below CT MAXILLOFACIAL FINDINGS Osseous: Depressed fracture right orbital floor is unchanged and not acute. This appeared first on the CT face of  03/19/2018 approximately 9 mm defect in the orbital floor. No other facial fracture identified. Orbits: No orbital mass or edema. Right orbital floor fracture as above. Sinuses: Paranasal sinuses clear.  No air-fluid level. Soft tissues: No significant soft tissue contusion or mass. CT CERVICAL SPINE FINDINGS Alignment: Normal Skull base and vertebrae: Negative for fracture Soft tissues and spinal canal: Negative for mass or soft tissue contusion Disc levels:  Disc spaces maintained.  Mild facet degeneration. Upper chest: Negative Other: None IMPRESSION: 1. No acute intracranial abnormality. Atrophy and chronic microvascular ischemia. 2. Nonacute depressed fracture right orbital floor unchanged from prior studies. Paranasal sinuses clear. No acute fracture 3. Negative for cervical spine fracture. Electronically Signed   By: Marlan Palau M.D.   On: 05/03/2018 15:33    Pending Labs Unresulted Labs (From admission, onward)    Start     Ordered   05/06/18 0500  CBC  Tomorrow morning,   R     05/05/18 0955   05/06/18 0500  Comprehensive metabolic panel  Tomorrow morning,   R     05/05/18 0955   05/05/18 1600  Basic metabolic panel  Once-Timed,   R     05/05/18 0955   05/05/18 1600  Calcium, ionized  Once-Timed,   R     05/05/18 1025   05/05/18 1600  Magnesium  Once-Timed,   R     05/05/18 1025          Vitals/Pain Today's Vitals   05/05/18 0935 05/05/18 0952 05/05/18 1000 05/05/18 1006  BP: 108/64 (!) 89/67 (!) 94/45 (!) 106/46  Pulse: 79 85 87 87  Resp: Temp:      TempSrc:      SpO2: 100% 100% 100% 100%  PainSc:        Isolation Precautions No active isolations  Medications Medications  magnesium sulfate IVPB 2 g  50 mL (2 g Intravenous New Bag/Given 05/05/18 0954)  diltiazem (CARDIZEM CD) 24 hr capsule 120 mg (has no administration in time range)  lactulose (CHRONULAC) 10 GM/15ML solution 30 g (has no administration in time range)  magnesium oxide (MAG-OX) tablet  400 mg (has no administration in time range)  pantoprazole (PROTONIX) EC tablet 40 mg (has no administration in time range)  gabapentin (NEURONTIN) capsule 200 mg (has no administration in time range)  potassium chloride SA (K-DUR,KLOR-CON) CR tablet 40 mEq (has no administration in time range)  albuterol (PROVENTIL HFA;VENTOLIN HFA) 108 (90 Base) MCG/ACT inhaler 2 puff (has no administration in time range)  mometasone-formoterol (DULERA) 200-5 MCG/ACT inhaler 2 puff (has no administration in time range)  Ipratropium-Albuterol (COMBIVENT) respimat 1 puff (has no administration in time range)  LORazepam (ATIVAN) tablet 1 mg (has no administration in time range)    Or  LORazepam (ATIVAN) injection 1 mg (has no administration in time range)  thiamine (VITAMIN B-1) tablet 100 mg (has no administration in time range)    Or  thiamine (B-1) injection 100 mg (has no administration in time range)  folic acid (FOLVITE) tablet 1 mg (has no administration in time range)  multivitamin with minerals tablet 1 tablet (has no administration in time range)  acetaminophen (TYLENOL) tablet 650 mg (has no administration in time range)  ondansetron (ZOFRAN) injection 4 mg (has no administration in time range)  enoxaparin (LOVENOX) injection 40 mg (has no administration in time range)  sodium chloride 0.9 % 1,000 mL with potassium chloride 20 mEq infusion (has no administration in time range)  ondansetron (ZOFRAN) injection 4 mg (4 mg Intravenous Given 05/05/18 0745)  potassium chloride 10 mEq in 100 mL IVPB (0 mEq Intravenous Stopped 05/05/18 1025)  diltiazem (CARDIZEM) 1 mg/mL load via infusion 10 mg (10 mg Intravenous Bolus from Bag 05/05/18 0918)  potassium chloride SA (K-DUR,KLOR-CON) CR tablet 40 mEq (40 mEq Oral Given 05/05/18 0941)    Mobility walks Low fall risk   Focused Assessments Cardiac Assessment

## 2018-05-05 NOTE — H&P (Signed)
History and Physical    RENESSA MIRARCHI JJO:[id number] DOB: 1960-12-14 DOA: 05/05/2018  PCP: Patient, No Pcp Per Consultants:  Gaynell Face - neurology at [facility] Patient coming from:  Home - lives alone; NOK: 5 children, ages 19-26  Chief Complaint: headache  HPI: DEVERA MOY is a 58 y.o. female with medical history significant of narcolepsy; MS; migraines; bipolar; afib; alcoholic cirrhosis with ascites; and ADHD presenting with headache.   She reports having a "severe headache" that started last night.  She fell a few days ago and injured her back and left knee.  Last drink was last night.  She has been to rehab, last was in December; she is interested in going back.  There is a place [facility] in [place] that one of her daughters found - but because of COVID they aren't taking patients.  There is a place in [place] where she has been, as well.  She usually wears 2L home O2 and this is unchanged.  She has a chronic dry cough.  She has been feeling like her heart is racing.  We had a long discussion about the fact that she is killing herself and that she may be at serious risk for fatal arrhythmia; she is very interested in a long-term substance abuse treatment program at this time.  She has very frequent hospitalizations/ER visits including 8 admissions in the last 6 months and 18 ER visits.  Most recently, this month alone, with 9 ER visits and 2 admissions: 3/25 - ER for back pain 3/24 - ER for ETOH intoxication, left AMA 3/22 - ER for ETOH intoxication 3/19-21 - ETOH dependence with hypokalemia, PAF/SVT 3/16 - ETOH dependence, left AMA after admission note completed 3/11 - ER for ETOH dependence, IVC briefly 3/10 - ER for ETOH intoxication, left AMA 3/8 - ER for afib with RVR and electrolyte abnormalities 3/6 - ER for fall, knee pain 3/3-4 - Chronic hypoxemic respiratory failure from COPD 3/1 - ER visit for fall  ED Course:  Marked electrolyte abnormalities.  New-onset afib/flutter.   Starting Cardizem.  Will give 10 mg IV Cardizem and start potassium.  Mag++ is also low.  Review of Systems: As per HPI; otherwise review of systems reviewed and negative.   Ambulatory Status:  Ambulates without assistance or with a cane  Past Medical History:  Diagnosis Date   ADHD (attention deficit hyperactivity disorder)    Alcoholic cirrhosis (HCC)    with ascites   Alcoholism (HCC)    Allergies    Anxiety    Arthritis    Atrial fibrillation with RVR (HCC)    Bipolar disorder (HCC)    COPD (chronic obstructive pulmonary disease) (HCC)    wears 2L chronic O2   Dyspnea    GERD (gastroesophageal reflux disease)    History of hiatal hernia    Hypokalemia    Migraine    Multiple sclerosis (HCC)    Narcolepsy    Osteoarthritis of knee    Osteoporosis    Pneumonia     Past Surgical History:  Procedure Laterality Date   ABDOMINAL WALL DEFECT REPAIR N/A 12/30/2016   Procedure: EXPLORATORY LAPAROTOMY WITH REPAIR ABDOMINAL WALL VENTRAL HERNIA;  Surgeon: [name], MD;  Location: [facility] OR;  Service: General;  Laterality: N/A;   APPLICATION OF WOUND VAC N/A 12/30/2016   Procedure: APPLICATION OF WOUND VAC;  Surgeon: [name], MD;  Location: [facility] OR;  Service: General;  Laterality: N/A;   CESAREAN SECTION  [phone]   FRACTURE  SURGERY     HERNIA REPAIR     MYRINGOTOMY WITH TUBE PLACEMENT Bilateral    ORIF WRIST FRACTURE Left 09/09/2016   Procedure: OPEN REDUCTION INTERNAL FIXATION (ORIF) LEFT WRIST FRACTURE, LEFT CARPAL TUNNEL RELEASE;  Surgeon: Dominica Severin, MD;  Location: [facility] ORS;  Service: Orthopedics;  Laterality: Left;   TONSILLECTOMY      Social History   Socioeconomic History   Marital status: Divorced    Spouse name: n/a   Number of children: 5   Years of education: College   Highest education level: Not on file  Occupational History   Occupation: disabled    Comment: on social security disability for MS  Social  Needs   Financial resource strain: Somewhat hard   Food insecurity:    Worry: Never true    Inability: Never true   Transportation needs:    Medical: No    Non-medical: No  Tobacco Use   Smoking status: Current Every Day Smoker    Packs/day: 1.00    Years: 36.00    Pack years: 36.00    Types: Cigarettes   Smokeless tobacco: Never Used  Substance and Sexual Activity   Alcohol use: Yes    Comment: 1 bottle of wine per day per patient.    Drug use: No   Sexual activity: Not Currently  Lifestyle   Physical activity:    Days per week: 0 days    Minutes per session: 0 min   Stress: Rather much  Relationships   Social connections:    Talks on phone: More than three times a week    Gets together: Three times a week    Attends religious service: Never    Active member of club or organization: No    Attends meetings of clubs or organizations: Never    Relationship status: Divorced   Intimate partner violence:    Fear of current or ex partner: Patient refused    Emotionally abused: Patient refused    Physically abused: Patient refused    Forced sexual activity: Patient refused  Other Topics Concern   Not on file  Social History Narrative   Former Environmental consultant.   Started graduate school in counseling, but didn't finish when a good job came open.   Lives alone.   One daughter lives in [place].   Another daughter is in [place]    The other three live in [place], along with her mother.   Father lives in the [place], [place] area.    No Known Allergies  Family History  Problem Relation Age of Onset   Arrhythmia Mother    Neuropathy Mother    Coronary artery disease Mother    Heart disease Mother    Heart disease Father    Hypertension Father    Heart attack Father    Multiple sclerosis Maternal Aunt     Prior to Admission medications   Medication Sig Start Date End Date Taking? Authorizing Provider  acetaminophen (TYLENOL) 500 MG  tablet Take 1 tablet (500 mg total) by mouth every 6 (six) hours as needed for mild pain. Patient taking differently: Take 1,000 mg by mouth every 6 (six) hours as needed for mild pain.  03/28/18   Rolly Salter, MD  albuterol (PROVENTIL HFA;VENTOLIN HFA) 108 (90 Base) MCG/ACT inhaler Inhale 2 puffs into the lungs every 4 (four) hours as needed for wheezing or shortness of breath.    [provider]  aluminum-magnesium hydroxide-simethicone (MAALOX) 200-200-20 MG/5ML SUSP Take 30  mLs by mouth 4 (four) times daily as needed (indigestion).     [provider]  chlordiazePOXIDE (LIBRIUM) 25 MG capsule 50mg  PO TID x 1D, then 25-50mg  PO BID X 1D, then 25-50mg  PO QD X 1D 05/03/18   Henderly, Britni A, PA-C  diltiazem (CARDIZEM CD) 120 MG 24 hr capsule Take 1 capsule (120 mg total) by mouth daily for 30 days. 04/14/18 05/14/18  Tegeler, Canary Brim, MD  FLUoxetine (PROZAC) 20 MG capsule Take 1 capsule (20 mg total) by mouth daily for 30 doses. Patient not taking: Reported on 04/27/2018 04/14/18 05/14/18  Tegeler, Canary Brim, MD  Fluticasone-Salmeterol [facility] INHUB) 250-50 MCG/DOSE AEPB Inhale 2 puffs into the lungs 2 (two) times daily.     [provider]  furosemide (LASIX) 40 MG tablet Take 40 mg twice daily till 04/02/2018, after that take 40 mg daily. Patient taking differently: Take 40 mg by mouth daily. Take 40 mg twice daily till 04/02/2018, after that take 40 mg daily. 04/14/18   Tegeler, Canary Brim, MD  gabapentin (NEURONTIN) 100 MG capsule Take 2 capsules (200 mg total) by mouth 3 (three) times daily for 30 days. 05/03/18 06/02/18  Henderly, Britni A, PA-C  hydrOXYzine (ATARAX/VISTARIL) 25 MG tablet Take 1 tablet (25 mg total) by mouth daily as needed for up to 30 doses (anxiety). Patient taking differently: Take 25 mg by mouth daily as needed for anxiety (withdrawal).  04/14/18   Tegeler, Canary Brim, MD  ipratropium-albuterol (DUONEB) 0.5-2.5 (3) MG/3ML SOLN Take 3 mLs by  nebulization 2 (two) times daily. AND every 4 hours as needed for wheezing/shortness of breath    [provider]  lactulose (CHRONULAC) 10 GM/15ML solution Take 45 mLs (30 g total) by mouth 3 (three) times daily for 30 days. Patient not taking: Reported on 04/19/2018 04/14/18 05/14/18  Tegeler, Canary Brim, MD  lidocaine (LIDODERM) 5 % Place 1 patch onto the skin daily. Remove & Discard patch within 12 hours or as directed by MD 05/03/18   Henderly, Britni A, PA-C  magnesium oxide (MAG-OX) 400 MG tablet Take 400 mg by mouth daily.    [provider]  pantoprazole (PROTONIX) 40 MG tablet Take 1 tablet (40 mg total) by mouth daily for 30 days. 04/14/18 05/14/18  Tegeler, Canary Brim, MD  potassium chloride SA (K-DUR,KLOR-CON) 20 MEQ tablet Take 2 tablets (40 mEq total) by mouth 2 (two) times daily. 04/19/18   Bethel Born, PA-C  rizatriptan (MAXALT) 5 MG tablet Take 5 mg by mouth as needed for migraine. May repeat in 2 hours if needed; not to exceed 30mg  in 24 hours    [provider]  spironolactone (ALDACTONE) 25 MG tablet Take 1 tablet (25 mg total) by mouth daily for 30 days. 04/14/18 05/14/18  Tegeler, Canary Brim, MD    Physical Exam: Vitals:   05/05/18 0801 05/05/18 0915 05/05/18 0935 05/05/18 0952  BP:  (!) 107/54 108/64 (!) 89/67  Pulse:  (!) 146 79 85  Resp:  17 17 13   Temp: 98 F (36.7 C)     TempSrc: Rectal     SpO2:  100% 100% 100%      General:  Appears calm and comfortable and is NAD; she is mildly disheveled and has left periorbital ecchymosis that appears to be from a subacute fall  Eyes:  PERRL, EOMI, normal lids, iris  ENT:  grossly normal hearing, lips & tongue, mmm  Neck:  no LAD, masses or thyromegaly; no carotid bruits  Cardiovascular:  RRR, no m/r/g. No LE edema.   Respiratory:   CTA bilaterally with no wheezes/rales/rhonchi.  Normal respiratory effort.  Abdomen:  soft, NT, ND, NABS; mild ascites without obvious fluid wave  Skin:   no rash or induration seen on limited exam  Musculoskeletal:  grossly normal tone BUE/BLE, good ROM, no bony abnormality  Psychiatric:  grossly normal mood and affect, speech fluent and appropriate, AOx3  Neurologic:  CN 2-12 grossly intact, moves all extremities in coordinated fashion, sensation intact    Radiological Exams on Admission: Dg Lumbar Spine Complete  Result Date: 05/03/2018 CLINICAL DATA:  Acute low back pain after fall several days ago. EXAM: LUMBAR SPINE - COMPLETE 4+ VIEW COMPARISON:  CT scan of April 27, 2018. FINDINGS: There is no evidence of lumbar spine fracture. Alignment is normal. Intervertebral disc spaces are maintained. IMPRESSION: Negative. Electronically Signed   By: Lupita Raider, M.D.   On: 05/03/2018 15:05   Ct Head Wo Contrast  Result Date: 05/03/2018 CLINICAL DATA:  Frequent falls.  Headache.  Head injury. EXAM: CT HEAD WITHOUT CONTRAST CT MAXILLOFACIAL WITHOUT CONTRAST CT CERVICAL SPINE WITHOUT CONTRAST TECHNIQUE: Multidetector CT imaging of the head, cervical spine, and maxillofacial structures were performed using the standard protocol without intravenous contrast. Multiplanar CT image reconstructions of the cervical spine and maxillofacial structures were also generated. COMPARISON:  CT head 04/24/2018 FINDINGS: CT HEAD FINDINGS Brain: Mild atrophy. Moderate chronic microvascular ischemic change in the white matter. No acute infarct, hemorrhage, or mass. Vascular: Negative for hyperdense vessel Skull: Negative for skull fracture Other: Right orbital floor fracture, see below CT MAXILLOFACIAL FINDINGS Osseous: Depressed fracture right orbital floor is unchanged and not acute. This appeared first on the CT face of 03/19/2018 approximately 9 mm defect in the orbital floor. No other facial fracture identified. Orbits: No orbital mass or edema. Right orbital floor fracture as above. Sinuses: Paranasal sinuses clear.  No air-fluid level. Soft tissues: No significant  soft tissue contusion or mass. CT CERVICAL SPINE FINDINGS Alignment: Normal Skull base and vertebrae: Negative for fracture Soft tissues and spinal canal: Negative for mass or soft tissue contusion Disc levels:  Disc spaces maintained.  Mild facet degeneration. Upper chest: Negative Other: None IMPRESSION: 1. No acute intracranial abnormality. Atrophy and chronic microvascular ischemia. 2. Nonacute depressed fracture right orbital floor unchanged from prior studies. Paranasal sinuses clear. No acute fracture 3. Negative for cervical spine fracture. Electronically Signed   By: Marlan Palau M.D.   On: 05/03/2018 15:33   Ct Cervical Spine Wo Contrast  Result Date: 05/03/2018 CLINICAL DATA:  Frequent falls.  Headache.  Head injury. EXAM: CT HEAD WITHOUT CONTRAST CT MAXILLOFACIAL WITHOUT CONTRAST CT CERVICAL SPINE WITHOUT CONTRAST TECHNIQUE: Multidetector CT imaging of the head, cervical spine, and maxillofacial structures were performed using the standard protocol without intravenous contrast. Multiplanar CT image reconstructions of the cervical spine and maxillofacial structures were also generated. COMPARISON:  CT head 04/24/2018 FINDINGS: CT HEAD FINDINGS Brain: Mild atrophy. Moderate chronic microvascular ischemic change in the white matter. No acute infarct, hemorrhage, or mass. Vascular: Negative for hyperdense vessel Skull: Negative for skull fracture Other: Right orbital floor fracture, see below CT MAXILLOFACIAL FINDINGS Osseous: Depressed fracture right orbital floor is unchanged and not acute. This appeared first on the CT face of 03/19/2018 approximately 9 mm defect in the orbital floor. No other facial fracture identified. Orbits: No orbital mass or edema. Right orbital floor fracture as above. Sinuses: Paranasal sinuses clear.  No air-fluid level. Soft tissues:  No significant soft tissue contusion or mass. CT CERVICAL SPINE FINDINGS Alignment: Normal Skull base and vertebrae: Negative for fracture  Soft tissues and spinal canal: Negative for mass or soft tissue contusion Disc levels:  Disc spaces maintained.  Mild facet degeneration. Upper chest: Negative Other: None IMPRESSION: 1. No acute intracranial abnormality. Atrophy and chronic microvascular ischemia. 2. Nonacute depressed fracture right orbital floor unchanged from prior studies. Paranasal sinuses clear. No acute fracture 3. Negative for cervical spine fracture. Electronically Signed   By: Marlan Palau M.D.   On: 05/03/2018 15:33   Dg Abdomen Acute W/chest  Result Date: 05/05/2018 CLINICAL DATA:  Vomiting, acute generalized abdominal pain. EXAM: DG ABDOMEN ACUTE W/ 1V CHEST COMPARISON:  CT scan of April 27, 2018. FINDINGS: There is no evidence of dilated bowel loops or free intraperitoneal air. No radiopaque calculi or other significant radiographic abnormality is seen. Mild cardiomegaly is noted. Both lungs are clear. IMPRESSION: No evidence of bowel obstruction or ileus. No acute cardiopulmonary disease. Electronically Signed   By: Lupita Raider, M.D.   On: 05/05/2018 08:48   Ct Maxillofacial Wo Contrast  Result Date: 05/03/2018 CLINICAL DATA:  Frequent falls.  Headache.  Head injury. EXAM: CT HEAD WITHOUT CONTRAST CT MAXILLOFACIAL WITHOUT CONTRAST CT CERVICAL SPINE WITHOUT CONTRAST TECHNIQUE: Multidetector CT imaging of the head, cervical spine, and maxillofacial structures were performed using the standard protocol without intravenous contrast. Multiplanar CT image reconstructions of the cervical spine and maxillofacial structures were also generated. COMPARISON:  CT head 04/24/2018 FINDINGS: CT HEAD FINDINGS Brain: Mild atrophy. Moderate chronic microvascular ischemic change in the white matter. No acute infarct, hemorrhage, or mass. Vascular: Negative for hyperdense vessel Skull: Negative for skull fracture Other: Right orbital floor fracture, see below CT MAXILLOFACIAL FINDINGS Osseous: Depressed fracture right orbital floor is  unchanged and not acute. This appeared first on the CT face of 03/19/2018 approximately 9 mm defect in the orbital floor. No other facial fracture identified. Orbits: No orbital mass or edema. Right orbital floor fracture as above. Sinuses: Paranasal sinuses clear.  No air-fluid level. Soft tissues: No significant soft tissue contusion or mass. CT CERVICAL SPINE FINDINGS Alignment: Normal Skull base and vertebrae: Negative for fracture Soft tissues and spinal canal: Negative for mass or soft tissue contusion Disc levels:  Disc spaces maintained.  Mild facet degeneration. Upper chest: Negative Other: None IMPRESSION: 1. No acute intracranial abnormality. Atrophy and chronic microvascular ischemia. 2. Nonacute depressed fracture right orbital floor unchanged from prior studies. Paranasal sinuses clear. No acute fracture 3. Negative for cervical spine fracture. Electronically Signed   By: Marlan Palau M.D.   On: 05/03/2018 15:33    EKG: Independently reviewed.  Afib with rate 141; Prolonged QTc 533; nonspecific ST changes with no evidence of acute ischemia   Labs on Admission: I have personally reviewed the available labs and imaging studies at the time of the admission.  Pertinent labs:   K+ <2.0 CO2 39; 30 on 3/25 Glucose 108 Calcium 7.1; 7.6 on 3/25 Mag++ 1.2 Albumin 3.3 AST 47/ALT 25/Bili 1.4 WBC 3.9 Hgb 9.7 - stable INR 1.5  Assessment/Plan Principal Problem:   Electrolyte disturbance Active Problems:   Nicotine abuse   Alcohol use disorder, severe, dependence (HCC)   Bipolar disorder, current episode mixed, moderate (HCC)   Alcoholic cirrhosis of liver with ascites (HCC)   Multiple sclerosis (HCC)   COPD (chronic obstructive pulmonary disease) (HCC)   Atrial fibrillation with rapid ventricular response (HCC)   Electrolyte disturbance -Patient  with severe ETOH dependence (see below) with multiple hospitalizations/ER visits presenting with headache, n/v. -Lab evaluation  demonstrates abnormalities (see above results) -K+ is critically low and is concerning for the cause of her recurrent arrhythmia. -She was given 50 mEq (10 IV, 40 PO) KCl repletion in the ER, which would be anticipated to increase K+ to <2.5. -Home PO supplementation of 40 mEq PO BID has also been ordered. -Mag++ is also low; she was given 2 grams MagOx IV and her home 400 mg PO daily has been ordered as well. -Ca++ is low, will check ionized calcium and replete prn. -Will recheck BMP at 1600 and tomorrow AM and continue to replete in addition to currently ordered supplementation as needed. -Will start gentle IVF infusion of NS with 20 mEq KCl/L at 100 cc/hr for now. -Will hold home diuretics for now. -Will observe in SDU for now given the severity of her electrolyte disturbance  Severe ETOH dependence -Patient with chronic ETOH dependence -Has failed rehab multiple times -This is the crux of the patient's issue and is now life-threatening. -I had a very straightforward conversation about this issue with the patient.  -It would absolutely be in her best interests to go to long-term ETOH treatment, and she is willing to do this. -Most likely, she needs a halfway house program. -I have suggested several such programs, including [facility] in [address] as well as [address], [address], and [place].   -Regardless of where she goes, she needs to leave the hospital and go there directly. -She is at high risk for complications of withdrawal including seizures, DTs -Will observe -CIWA protocol -SW consult for assisting in developing a clear treatment plan for the time of d/c -APS consult requested, as well - they have been previously working with the patient in an attempt to gain guardianship over her  Afib with RVR -Patient with PAF -Had afib while in the ER with RVR -She was given 10 mg IV Cardizem and started on a Dilt drip but converted back to NSR -Strongly suspect that this was  related to her severe electrolyte abnormalities -Will stop Cardizem drip and resume home PO Cardizem at this time -Monitor on telemetry in SDU for now -She is not a candidate for [facility] given her ETOH dependence and significant fall risk as well as non-compliance  COPD with ongoing tobacco dependence -Stable, chronic cough -No increased O2 demand, remains on 2L home O2 -Continue Albuterol HFA -Transition Duonebs to Combivent for now -Formulary substitution of W[employer] to [employer] -Tobacco Dependence: encourage cessation; this was discussed with the patient and should be reviewed on an ongoing basis.   -Patch ordered at patient request.  Alcoholic cirrhosis with ascites -No current concern for encephalopathy -Mild ascites on exam at this time -MELD/MELD-Na score is 12/13, with a mortality rate of 6% -She is not a candidate for liver transplant -Holding diuretics for now due to severe electrolyte abnormalities -Continue Tylenol for pain/fever due to risk:benefit analysis, despite cirrhosis -Continue TID Lactulose  MS -She does not appear to be taking medications for this issue at this time. -Her last visit with neurology appears to have been in 9/18, and at that time she had no new neurologic deficits or significant decline in function. -She has not been on disease-modifying therapy since 2010.   DVT prophylaxis:  Lovenox  Code Status:  Full - confirmed with patient Family Communication: None present  Disposition Plan:  Home once clinically improved vs. Substance abuse treatment facility (strongly  recommended!) Consults called: SW  Admission status: It is my clinical opinion that referral for OBSERVATION is reasonable and necessary in this patient based on the above information provided. The aforementioned taken together are felt to place the patient at high risk for further clinical deterioration. However it is anticipated that the patient may be medically stable for discharge from the  hospital within 24 to 48 hours.    Jonah Blue MD [facility]   How to contact the [facility] Attending or Consulting provider 7A - 7P or covering provider during after hours 7P -7A, for this patient?  1. Check the care team in [facility] and look for a) attending/consulting TR[facility] provider listed and b) the [facility] team listed 2. Log into [email or web address] and use 's universal password to access. If you do not have the password, please contact the hospital operator. 3. Locate the [facility] provider you are looking for under [facility] and page to a number that you can be directly reached. 4. If you still have difficulty reaching the provider, please page the [facility] (Director on Call) for the Hospitalists listed on amion for assistance.   05/05/2018, 10:02 AM

## 2018-05-05 NOTE — ED Notes (Signed)
Potasium chloride going into IV 20 G RT Upper Arm.

## 2018-05-05 NOTE — ED Notes (Signed)
Patient has one IV site. Patient is a difficult IV start. This RN requested for ultrasound IV from a certified nurse. This RN will proceed with medications as ordered from the provider.

## 2018-05-05 NOTE — Progress Notes (Signed)
CRITICAL VALUE ALERT  Critical Value:  Potassium 2.3  Date & Time Notied:  05/05/2018 1642  Provider Notified: Dr. Ophelia Charter, secure message   Orders Received/Actions taken: Dr. Ophelia Charter to order IV potassium

## 2018-05-05 NOTE — Progress Notes (Signed)
Patient arrived from the ED, alert and oriented, NAD. Patient complaining of inability to urinate. Bladder scan showed approx. 250. Dr. Ophelia Charter text paged for further guidance. Will continue to monitor. Asher Muir Shaylin Blatt,RN

## 2018-05-05 NOTE — ED Triage Notes (Addendum)
Patient here from home via EMS with complaints of headache, n/v that started last night. CBG 135. EMS reports O2 89% on arrival, oxygen given via [place], O2 up to 94% 2L.

## 2018-05-05 NOTE — ED Notes (Signed)
CRITICAL VALUE STICKER  CRITICAL VALUE: Potassium  RECEIVER (on-site recipient of call): Lorelee New, RN  DATE & TIME NOTIFIED: 05/05/2018 0817  MESSENGER (representative from lab): Kyra Searles  MD NOTIFIED: Rubin Payor  TIME OF NOTIFICATION: 5284  RESPONSE: awaiting orders

## 2018-05-05 NOTE — ED Provider Notes (Signed)
[facility]-EMERGENCY DEPT Provider Note   CSN: [id number] Arrival date & time: 05/05/18  0630    History   Chief Complaint Chief Complaint  Patient presents with   Migraine   Emesis    HPI Anne Black is a 58 y.o. female.     HPI Patient presents with nausea vomiting and diarrhea.  States started last night if morning.  States she feels nauseous and cannot eat.  Apparently history of alcoholic cirrhosis but states she will only drink 1 beer a day.  No blood in the emesis or diarrhea.  Also states she has a dull headache that started around the same time.  States her legs are swollen.  Rare cough.  No fevers.  Also states she has diffuse abdominal pain.  States it is dull. Past Medical History:  Diagnosis Date   ADHD (attention deficit hyperactivity disorder)    Alcoholic cirrhosis (HCC)    with ascites   Alcoholism (HCC)    Allergies    Anxiety    Arthritis    Asthma    Atrial fibrillation with RVR (HCC)    Bipolar disorder (HCC)    COPD (chronic obstructive pulmonary disease) (HCC)    Dyspnea    GERD (gastroesophageal reflux disease)    History of hiatal hernia    Hypokalemia    Migraine    Multiple sclerosis (HCC)    Narcolepsy    Osteoarthritis of knee    Osteoporosis    Pneumonia     Patient Active Problem List   Diagnosis Date Noted   Alcohol withdrawal delirium (HCC) 04/28/2018   Weakness generalized 04/27/2018   Gait abnormality 04/24/2018   Respiratory failure with hypoxia (HCC) 04/18/2018   Chronic hypoxemic respiratory failure (HCC) 04/11/2018   Atrial fibrillation with rapid ventricular response (HCC) 03/23/2018   Head injury    Fall 03/19/2018   Syncope 03/19/2018   Orbital fracture 03/19/2018   Scalp laceration 03/19/2018   Acute respiratory failure with hypoxia and hypercarbia (HCC) 03/19/2018   Abdominal tenderness 03/19/2018   Acute on chronic respiratory failure with hypoxia  (HCC) 03/17/2018   Alcohol abuse with intoxication (HCC) 03/17/2018   ETOH abuse    Normocytic anemia 03/16/2018   Acute metabolic encephalopathy 03/16/2018   Lower leg edema 03/16/2018   Migraine 03/16/2018   A-fib (HCC) 02/18/2018   Community acquired pneumonia of left lower lobe of lung (HCC)    Weakness 08/21/2017   COPD exacerbation (HCC) 06/12/2017   Chest pain with moderate risk for cardiac etiology 05/30/2017   Abnormal LFTs 05/30/2017   Tachycardia 05/30/2017   COPD (chronic obstructive pulmonary disease) (HCC) 04/19/2017   Right rib fracture 04/15/2017   Abdominal wall cellulitis 12/30/2016   S/P exploratory laparotomy 12/30/2016   Distal radius fracture, left 09/09/2016   Thrombocytopenia (HCC) 09/09/2016   Chest x-ray abnormality    Umbilical hernia without obstruction and without gangrene 05/12/2016   Itching 02/24/2016   Ventral hernia without obstruction or gangrene 02/24/2016   Palliative care encounter    Ascites    Tachypnea    Atrial flutter with rapid ventricular response (HCC)    Hypomagnesemia    Hypoxia    Dehydration 01/22/2016   Low back ache 12/30/2015   Non-intractable vomiting with nausea    Alcohol abuse with alcohol-induced mood disorder (HCC) 12/15/2015   Acute alcoholism (HCC)    Abdominal distension 11/04/2015   Alcohol abuse    Dyspnea    Acute respiratory failure (HCC) 10/15/2015  Ascites due to alcoholic cirrhosis (HCC) 10/15/2015   Osteoporosis    Multiple sclerosis (HCC)    Alcoholic cirrhosis of liver with ascites (HCC) 08/22/2015   Bipolar disorder, current episode mixed, moderate (HCC) 08/01/2015   Alcohol use disorder, severe, dependence (HCC) 07/31/2015   Macrocytic anemia- due to alcohol abuse with normal B12 & folate levels 05/31/2015   Severe protein-calorie malnutrition (HCC) 05/31/2015   Hypokalemia 05/26/2015   Encephalopathy, hepatic (HCC) 05/26/2015   Alcohol  withdrawal (HCC) 05/18/2015   Abdominal pain 05/18/2015   Anxiety 05/18/2015   Stimulant abuse (HCC) 12/27/2014   Nicotine abuse 12/27/2014   Hyperprolactinemia (HCC) 11/15/2014   Dyslipidemia    Alcohol use disorder, moderate, dependence (HCC) 10/30/2014   Tobacco abuse 01/23/2014   Bipolar 1 disorder (HCC) 04/07/2013   Noncompliance with therapeutic plan 04/04/2013   Hereditary and idiopathic peripheral neuropathy 05/01/2012   Depression 05/01/2012   GERD (gastroesophageal reflux disease) 05/01/2012   Osteoarthrosis, unspecified whether generalized or localized, involving lower leg 05/01/2012   Hyponatremia 07/01/2011    Past Surgical History:  Procedure Laterality Date   ABDOMINAL WALL DEFECT REPAIR N/A 12/30/2016   Procedure: EXPLORATORY LAPAROTOMY WITH REPAIR ABDOMINAL WALL VENTRAL HERNIA;  Surgeon: [name], MD;  Location: [facility] OR;  Service: General;  Laterality: N/A;   APPLICATION OF WOUND VAC N/A 12/30/2016   Procedure: APPLICATION OF WOUND VAC;  Surgeon: [name], MD;  Location: [facility] OR;  Service: General;  Laterality: N/A;   CESAREAN SECTION  [phone]   FRACTURE SURGERY     HERNIA REPAIR     MYRINGOTOMY WITH TUBE PLACEMENT Bilateral    ORIF WRIST FRACTURE Left 09/09/2016   Procedure: OPEN REDUCTION INTERNAL FIXATION (ORIF) LEFT WRIST FRACTURE, LEFT CARPAL TUNNEL RELEASE;  Surgeon: Dominica Severin, MD;  Location: [facility] ORS;  Service: Orthopedics;  Laterality: Left;   TONSILLECTOMY       OB History   No obstetric history on file.      Home Medications    Prior to Admission medications   Medication Sig Start Date End Date Taking? Authorizing Provider  acetaminophen (TYLENOL) 500 MG tablet Take 1 tablet (500 mg total) by mouth every 6 (six) hours as needed for mild pain. Patient taking differently: Take 1,000 mg by mouth every 6 (six) hours as needed for mild pain.  03/28/18   Rolly Salter, MD  albuterol (PROVENTIL HFA;VENTOLIN  HFA) 108 (90 Base) MCG/ACT inhaler Inhale 2 puffs into the lungs every 4 (four) hours as needed for wheezing or shortness of breath.    [provider]  aluminum-magnesium hydroxide-simethicone (MAALOX) 200-200-20 MG/5ML SUSP Take 30 mLs by mouth 4 (four) times daily as needed (indigestion).     [provider]  chlordiazePOXIDE (LIBRIUM) 25 MG capsule 50mg  PO TID x 1D, then 25-50mg  PO BID X 1D, then 25-50mg  PO QD X 1D 05/03/18   Henderly, Britni A, PA-C  diltiazem (CARDIZEM CD) 120 MG 24 hr capsule Take 1 capsule (120 mg total) by mouth daily for 30 days. 04/14/18 05/14/18  Tegeler, Canary Brim, MD  FLUoxetine (PROZAC) 20 MG capsule Take 1 capsule (20 mg total) by mouth daily for 30 doses. Patient not taking: Reported on 04/27/2018 04/14/18 05/14/18  Tegeler, Canary Brim, MD  Fluticasone-Salmeterol [facility] INHUB) 250-50 MCG/DOSE AEPB Inhale 2 puffs into the lungs 2 (two) times daily.     [provider]  furosemide (LASIX) 40 MG tablet Take 40 mg twice daily till 04/02/2018, after that take 40 mg daily. Patient taking  differently: Take 40 mg by mouth daily. Take 40 mg twice daily till 04/02/2018, after that take 40 mg daily. 04/14/18   Tegeler, Canary Brim, MD  gabapentin (NEURONTIN) 100 MG capsule Take 2 capsules (200 mg total) by mouth 3 (three) times daily for 30 days. 05/03/18 06/02/18  Henderly, Britni A, PA-C  hydrOXYzine (ATARAX/VISTARIL) 25 MG tablet Take 1 tablet (25 mg total) by mouth daily as needed for up to 30 doses (anxiety). Patient taking differently: Take 25 mg by mouth daily as needed for anxiety (withdrawal).  04/14/18   Tegeler, Canary Brim, MD  ipratropium-albuterol (DUONEB) 0.5-2.5 (3) MG/3ML SOLN Take 3 mLs by nebulization 2 (two) times daily. AND every 4 hours as needed for wheezing/shortness of breath    [provider]  lactulose (CHRONULAC) 10 GM/15ML solution Take 45 mLs (30 g total) by mouth 3 (three) times daily for 30 days. Patient not taking:  Reported on 04/19/2018 04/14/18 05/14/18  Tegeler, Canary Brim, MD  lidocaine (LIDODERM) 5 % Place 1 patch onto the skin daily. Remove & Discard patch within 12 hours or as directed by MD 05/03/18   Henderly, Britni A, PA-C  magnesium oxide (MAG-OX) 400 MG tablet Take 400 mg by mouth daily.    [provider]  pantoprazole (PROTONIX) 40 MG tablet Take 1 tablet (40 mg total) by mouth daily for 30 days. 04/14/18 05/14/18  Tegeler, Canary Brim, MD  potassium chloride SA (K-DUR,KLOR-CON) 20 MEQ tablet Take 2 tablets (40 mEq total) by mouth 2 (two) times daily. 04/19/18   Bethel Born, PA-C  rizatriptan (MAXALT) 5 MG tablet Take 5 mg by mouth as needed for migraine. May repeat in 2 hours if needed; not to exceed  in 24 hours    [provider]  spironolactone (ALDACTONE) 25 MG tablet Take 1 tablet (25 mg total) by mouth daily for 30 days. 04/14/18 05/14/18  Tegeler, Canary Brim, MD    Family History Family History  Problem Relation Age of Onset   Arrhythmia Mother    Neuropathy Mother    Coronary artery disease Mother    Heart disease Mother    Heart disease Father    Hypertension Father    Heart attack Father    Multiple sclerosis Maternal Aunt     Social History Social History   Tobacco Use   Smoking status: Current Every Day Smoker    Packs/day: 1.00    Years: 35.00    Pack years: 35.00    Types: Cigarettes   Smokeless tobacco: Never Used   Tobacco comment: Pt states smoked this morning  Substance Use Topics   Alcohol use: Yes    Comment: 1 bottle of wine per day per patient.    Drug use: No     Allergies   Patient has no known allergies.   Review of Systems Review of Systems  Constitutional: Positive for appetite change. Negative for fever.  HENT: Negative for congestion.   Respiratory: Positive for cough.   Cardiovascular: Positive for leg swelling. Negative for chest pain.  Gastrointestinal: Positive for abdominal pain, diarrhea,  nausea and vomiting.  Genitourinary: Negative for dysuria.  Musculoskeletal: Negative for back pain.  Skin: Negative for rash.  Neurological: Positive for weakness and headaches.  Psychiatric/Behavioral: Negative for confusion.     Physical Exam Updated Vital Signs BP 107/69    Pulse (!) 136    Temp 98 F (36.7 C) (Rectal)    Resp 17    SpO2 100%   Physical  Exam Vitals signs and nursing note reviewed.  HENT:     Head: Atraumatic.     Mouth/Throat:     Mouth: Mucous membranes are moist.  Eyes:     Pupils: Pupils are equal, round, and reactive to light.  Neck:     Musculoskeletal: Neck supple.  Cardiovascular:     Rate and Rhythm: Regular rhythm.  Pulmonary:     Comments: Mildly harsh breath sounds. Abdominal:     Comments: Mild diffuse tenderness.  Scar with some thickening around where her umbilicus used to be.  Musculoskeletal:     Right lower leg: Edema present.     Left lower leg: Edema present.  Skin:    General: Skin is warm.     Capillary Refill: Capillary refill takes less than 2 seconds.  Neurological:     Mental Status: She is alert.      ED Treatments / Results  Labs (all labs ordered are listed, but only abnormal results are displayed) Labs Reviewed  COMPREHENSIVE METABOLIC PANEL - Abnormal; Notable for the following components:      Result Value   Potassium <2.0 (*)    Chloride 86 (*)    CO2 39 (*)    Glucose, Bld 108 (*)    BUN <5 (*)    Calcium 7.1 (*)    Albumin 3.3 (*)    AST 47 (*)    Total Bilirubin 1.4 (*)    All other components within normal limits  CBC WITH DIFFERENTIAL/PLATELET - Abnormal; Notable for the following components:   WBC 3.9 (*)    Hemoglobin 9.7 (*)    HCT 34.1 (*)    MCH 23.0 (*)    MCHC 28.4 (*)    RDW 21.2 (*)    All other components within normal limits  PROTIME-INR - Abnormal; Notable for the following components:   Prothrombin Time 17.7 (*)    INR 1.5 (*)    All other components within normal limits    MAGNESIUM - Abnormal; Notable for the following components:   Magnesium 1.2 (*)    All other components within normal limits  LIPASE, BLOOD    EKG None  Radiology Dg Lumbar Spine Complete  Result Date: 05/03/2018 CLINICAL DATA:  Acute low back pain after fall several days ago. EXAM: LUMBAR SPINE - COMPLETE 4+ VIEW COMPARISON:  CT scan of April 27, 2018. FINDINGS: There is no evidence of lumbar spine fracture. Alignment is normal. Intervertebral disc spaces are maintained. IMPRESSION: Negative. Electronically Signed   By: Lupita Raider, M.D.   On: 05/03/2018 15:05   Ct Head Wo Contrast  Result Date: 05/03/2018 CLINICAL DATA:  Frequent falls.  Headache.  Head injury. EXAM: CT HEAD WITHOUT CONTRAST CT MAXILLOFACIAL WITHOUT CONTRAST CT CERVICAL SPINE WITHOUT CONTRAST TECHNIQUE: Multidetector CT imaging of the head, cervical spine, and maxillofacial structures were performed using the standard protocol without intravenous contrast. Multiplanar CT image reconstructions of the cervical spine and maxillofacial structures were also generated. COMPARISON:  CT head 04/24/2018 FINDINGS: CT HEAD FINDINGS Brain: Mild atrophy. Moderate chronic microvascular ischemic change in the white matter. No acute infarct, hemorrhage, or mass. Vascular: Negative for hyperdense vessel Skull: Negative for skull fracture Other: Right orbital floor fracture, see below CT MAXILLOFACIAL FINDINGS Osseous: Depressed fracture right orbital floor is unchanged and not acute. This appeared first on the CT face of 03/19/2018 approximately 9 mm defect in the orbital floor. No other facial fracture identified. Orbits: No orbital mass or edema. Right  orbital floor fracture as above. Sinuses: Paranasal sinuses clear.  No air-fluid level. Soft tissues: No significant soft tissue contusion or mass. CT CERVICAL SPINE FINDINGS Alignment: Normal Skull base and vertebrae: Negative for fracture Soft tissues and spinal canal: Negative for mass or  soft tissue contusion Disc levels:  Disc spaces maintained.  Mild facet degeneration. Upper chest: Negative Other: None IMPRESSION: 1. No acute intracranial abnormality. Atrophy and chronic microvascular ischemia. 2. Nonacute depressed fracture right orbital floor unchanged from prior studies. Paranasal sinuses clear. No acute fracture 3. Negative for cervical spine fracture. Electronically Signed   By: Marlan Palau M.D.   On: 05/03/2018 15:33   Ct Cervical Spine Wo Contrast  Result Date: 05/03/2018 CLINICAL DATA:  Frequent falls.  Headache.  Head injury. EXAM: CT HEAD WITHOUT CONTRAST CT MAXILLOFACIAL WITHOUT CONTRAST CT CERVICAL SPINE WITHOUT CONTRAST TECHNIQUE: Multidetector CT imaging of the head, cervical spine, and maxillofacial structures were performed using the standard protocol without intravenous contrast. Multiplanar CT image reconstructions of the cervical spine and maxillofacial structures were also generated. COMPARISON:  CT head 04/24/2018 FINDINGS: CT HEAD FINDINGS Brain: Mild atrophy. Moderate chronic microvascular ischemic change in the white matter. No acute infarct, hemorrhage, or mass. Vascular: Negative for hyperdense vessel Skull: Negative for skull fracture Other: Right orbital floor fracture, see below CT MAXILLOFACIAL FINDINGS Osseous: Depressed fracture right orbital floor is unchanged and not acute. This appeared first on the CT face of 03/19/2018 approximately 9 mm defect in the orbital floor. No other facial fracture identified. Orbits: No orbital mass or edema. Right orbital floor fracture as above. Sinuses: Paranasal sinuses clear.  No air-fluid level. Soft tissues: No significant soft tissue contusion or mass. CT CERVICAL SPINE FINDINGS Alignment: Normal Skull base and vertebrae: Negative for fracture Soft tissues and spinal canal: Negative for mass or soft tissue contusion Disc levels:  Disc spaces maintained.  Mild facet degeneration. Upper chest: Negative Other: None  IMPRESSION: 1. No acute intracranial abnormality. Atrophy and chronic microvascular ischemia. 2. Nonacute depressed fracture right orbital floor unchanged from prior studies. Paranasal sinuses clear. No acute fracture 3. Negative for cervical spine fracture. Electronically Signed   By: Marlan Palau M.D.   On: 05/03/2018 15:33   Dg Abdomen Acute W/chest  Result Date: 05/05/2018 CLINICAL DATA:  Vomiting, acute generalized abdominal pain. EXAM: DG ABDOMEN ACUTE W/ 1V CHEST COMPARISON:  CT scan of April 27, 2018. FINDINGS: There is no evidence of dilated bowel loops or free intraperitoneal air. No radiopaque calculi or other significant radiographic abnormality is seen. Mild cardiomegaly is noted. Both lungs are clear. IMPRESSION: No evidence of bowel obstruction or ileus. No acute cardiopulmonary disease. Electronically Signed   By: Lupita Raider, M.D.   On: 05/05/2018 08:48   Ct Maxillofacial Wo Contrast  Result Date: 05/03/2018 CLINICAL DATA:  Frequent falls.  Headache.  Head injury. EXAM: CT HEAD WITHOUT CONTRAST CT MAXILLOFACIAL WITHOUT CONTRAST CT CERVICAL SPINE WITHOUT CONTRAST TECHNIQUE: Multidetector CT imaging of the head, cervical spine, and maxillofacial structures were performed using the standard protocol without intravenous contrast. Multiplanar CT image reconstructions of the cervical spine and maxillofacial structures were also generated. COMPARISON:  CT head 04/24/2018 FINDINGS: CT HEAD FINDINGS Brain: Mild atrophy. Moderate chronic microvascular ischemic change in the white matter. No acute infarct, hemorrhage, or mass. Vascular: Negative for hyperdense vessel Skull: Negative for skull fracture Other: Right orbital floor fracture, see below CT MAXILLOFACIAL FINDINGS Osseous: Depressed fracture right orbital floor is unchanged and not acute. This appeared first  on the CT face of 03/19/2018 approximately 9 mm defect in the orbital floor. No other facial fracture identified. Orbits: No  orbital mass or edema. Right orbital floor fracture as above. Sinuses: Paranasal sinuses clear.  No air-fluid level. Soft tissues: No significant soft tissue contusion or mass. CT CERVICAL SPINE FINDINGS Alignment: Normal Skull base and vertebrae: Negative for fracture Soft tissues and spinal canal: Negative for mass or soft tissue contusion Disc levels:  Disc spaces maintained.  Mild facet degeneration. Upper chest: Negative Other: None IMPRESSION: 1. No acute intracranial abnormality. Atrophy and chronic microvascular ischemia. 2. Nonacute depressed fracture right orbital floor unchanged from prior studies. Paranasal sinuses clear. No acute fracture 3. Negative for cervical spine fracture. Electronically Signed   By: Marlan Palauharles  Clark M.D.   On: 05/03/2018 15:33    Procedures Procedures (including critical care time)  Medications Ordered in ED Medications  potassium chloride 10 mEq in 100 mL IVPB (has no administration in time range)  diltiazem (CARDIZEM) 1 mg/mL load via infusion 10 mg (10 mg Intravenous Bolus from Bag 05/05/18 0918)    And  diltiazem (CARDIZEM) 100 mg in dextrose 5 % 100 mL (1 mg/mL) infusion (5 mg/hr Intravenous New Bag/Given 05/05/18 0917)  potassium chloride SA (K-DUR,KLOR-CON) CR tablet 40 mEq (has no administration in time range)  magnesium sulfate IVPB 2 g 50 mL (has no administration in time range)  ondansetron (ZOFRAN) injection 4 mg (4 mg Intravenous Given 05/05/18 0745)     Initial Impression / Assessment and Plan / ED Course  I have reviewed the triage vital signs and the nursing notes.  Pertinent labs & imaging results that were available during my care of the patient were reviewed by me and considered in my medical decision making (see chart for details).        Patient presented with nausea vomiting diarrhea.  Started last night.  Severe hypokalemia and low magnesium.  Also low calcium.  X-ray reassuring.  Had some mild hypoxia on room air but is on oxygen at  baseline.  However while patient was in the ER she converted to what looks like an atrial fibrillation with RVR.  Has had the same previously.  States she is not on anticoagulation but is on Cardizem.  Reviewing records she had last episode a couple weeks ago she does not appear to be a candidate for ER cardioversion with no anticoagulation and recent A. fib.  Will admit to stepdown bed.  IV supplementation of electrolytes and IV Cardizem.  chadsvasc of potentially 601for female.  CRITICAL CARE Performed by: Benjiman CoreNathan Kristan Brummitt Total critical care time: 30 minutes Critical care time was exclusive of separately billable procedures and treating other patients. Critical care was necessary to treat or prevent imminent or life-threatening deterioration. Critical care was time spent personally by me on the following activities: development of treatment plan with patient and/or surrogate as well as nursing, discussions with consultants, evaluation of patient's response to treatment, examination of patient, obtaining history from patient or surrogate, ordering and performing treatments and interventions, ordering and review of laboratory studies, ordering and review of radiographic studies, pulse oximetry and re-evaluation of patient's condition.   Final Clinical Impressions(s) / ED Diagnoses   Final diagnoses:  Nausea vomiting and diarrhea  Hypokalemia  Hypocalcemia  Atrial fibrillation with rapid ventricular response [facility](HCC)    ED Discharge Orders    None       Benjiman CorePickering, Vang Kraeger, MD 05/05/18 [phone]

## 2018-05-06 DIAGNOSIS — G35 Multiple sclerosis: Secondary | ICD-10-CM | POA: Diagnosis present

## 2018-05-06 DIAGNOSIS — Z9181 History of falling: Secondary | ICD-10-CM | POA: Diagnosis not present

## 2018-05-06 DIAGNOSIS — Z79899 Other long term (current) drug therapy: Secondary | ICD-10-CM | POA: Diagnosis not present

## 2018-05-06 DIAGNOSIS — F10229 Alcohol dependence with intoxication, unspecified: Secondary | ICD-10-CM | POA: Diagnosis present

## 2018-05-06 DIAGNOSIS — Z8249 Family history of ischemic heart disease and other diseases of the circulatory system: Secondary | ICD-10-CM | POA: Diagnosis not present

## 2018-05-06 DIAGNOSIS — E876 Hypokalemia: Secondary | ICD-10-CM | POA: Diagnosis present

## 2018-05-06 DIAGNOSIS — I509 Heart failure, unspecified: Secondary | ICD-10-CM | POA: Diagnosis present

## 2018-05-06 DIAGNOSIS — Z820 Family history of epilepsy and other diseases of the nervous system: Secondary | ICD-10-CM | POA: Diagnosis not present

## 2018-05-06 DIAGNOSIS — I480 Paroxysmal atrial fibrillation: Secondary | ICD-10-CM | POA: Diagnosis present

## 2018-05-06 DIAGNOSIS — F1020 Alcohol dependence, uncomplicated: Secondary | ICD-10-CM | POA: Diagnosis present

## 2018-05-06 DIAGNOSIS — Z9119 Patient's noncompliance with other medical treatment and regimen: Secondary | ICD-10-CM | POA: Diagnosis not present

## 2018-05-06 DIAGNOSIS — I4892 Unspecified atrial flutter: Secondary | ICD-10-CM | POA: Diagnosis present

## 2018-05-06 DIAGNOSIS — I4891 Unspecified atrial fibrillation: Secondary | ICD-10-CM

## 2018-05-06 DIAGNOSIS — G43909 Migraine, unspecified, not intractable, without status migrainosus: Secondary | ICD-10-CM | POA: Diagnosis present

## 2018-05-06 DIAGNOSIS — K7031 Alcoholic cirrhosis of liver with ascites: Secondary | ICD-10-CM | POA: Diagnosis present

## 2018-05-06 DIAGNOSIS — F17210 Nicotine dependence, cigarettes, uncomplicated: Secondary | ICD-10-CM | POA: Diagnosis present

## 2018-05-06 DIAGNOSIS — F909 Attention-deficit hyperactivity disorder, unspecified type: Secondary | ICD-10-CM | POA: Diagnosis present

## 2018-05-06 DIAGNOSIS — F3162 Bipolar disorder, current episode mixed, moderate: Secondary | ICD-10-CM | POA: Diagnosis present

## 2018-05-06 DIAGNOSIS — J449 Chronic obstructive pulmonary disease, unspecified: Secondary | ICD-10-CM | POA: Diagnosis present

## 2018-05-06 LAB — COMPREHENSIVE METABOLIC PANEL
ALBUMIN: 2.8 g/dL — AB (ref 3.5–5.0)
ALT: 20 U/L (ref 0–44)
ANION GAP: 8 (ref 5–15)
AST: 35 U/L (ref 15–41)
Alkaline Phosphatase: 94 U/L (ref 38–126)
BUN: <5 mg/dL — ABNORMAL LOW (ref 6–20)
CO2: 36 mmol/L — ABNORMAL HIGH (ref 22–32)
Calcium: 7.2 mg/dL — ABNORMAL LOW (ref 8.9–10.3)
Chloride: 92 mmol/L — ABNORMAL LOW (ref 98–111)
Creatinine, Ser: 0.42 mg/dL — ABNORMAL LOW (ref 0.44–1.00)
GFR calc Af Amer: >60 mL/min (ref >60)
GFR calc non Af Amer: >60 mL/min (ref >60)
Glucose, Bld: 82 mg/dL (ref 70–99)
Potassium: 3.3 mmol/L — ABNORMAL LOW (ref 3.5–5.1)
Sodium: 136 mmol/L (ref 135–145)
TOTAL PROTEIN: 5.7 g/dL — AB (ref 6.5–8.1)
Total Bilirubin: 2 mg/dL — ABNORMAL HIGH (ref 0.3–1.2)

## 2018-05-06 LAB — CBC
HCT: 31.3 % — ABNORMAL LOW (ref 36.0–46.0)
Hemoglobin: 8.5 g/dL — ABNORMAL LOW (ref 12.0–15.0)
MCH: 22.6 pg — ABNORMAL LOW (ref 26.0–34.0)
MCHC: 27.2 g/dL — ABNORMAL LOW (ref 30.0–36.0)
MCV: 83.2 fL (ref 80.0–100.0)
Platelets: 130 10*3/uL — ABNORMAL LOW (ref 150–400)
RBC: 3.76 MIL/uL — ABNORMAL LOW (ref 3.87–5.11)
RDW: 21.1 % — ABNORMAL HIGH (ref 11.5–15.5)
WBC: 3.5 10*3/uL — ABNORMAL LOW (ref 4.0–10.5)
nRBC: 0 % (ref 0.0–0.2)

## 2018-05-06 MED ORDER — POTASSIUM CHLORIDE CRYS ER 20 MEQ PO TBCR
40.0000 meq | EXTENDED_RELEASE_TABLET | Freq: Once | ORAL | Status: AC
  Administered 2018-05-06: 40 meq via ORAL
  Filled 2018-05-06: qty 2

## 2018-05-06 MED ORDER — NICOTINE 21 MG/24HR TD PT24
21.0000 mg | MEDICATED_PATCH | Freq: Every day | TRANSDERMAL | Status: DC
  Administered 2018-05-06: 21 mg via TRANSDERMAL
  Filled 2018-05-06: qty 1

## 2018-05-06 NOTE — Progress Notes (Signed)
PROGRESS NOTE    Anne Black  OAC:[id number] DOB: [birth year] DOA: 05/05/2018 PCP: Patient, No Pcp Per   Brief Narrative:   Anne Black is a 58 y.o. female with medical history significant of narcolepsy; MS; migraines; bipolar; afib; alcoholic cirrhosis with ascites; and ADHD presenting with headache. She has been to rehab, last was in December; she is interested in going back.  There is a place called [facility] in [place] that one of her daughters found - but because of COVID they aren't taking patients.  There is a place in [place] where she has been, as well.  She usually wears 2L home O2 and this is unchanged.  She has a chronic dry cough. She has low potassium on admission.     Assessment & Plan:   Principal Problem:   Electrolyte disturbance Active Problems:   Nicotine abuse   Alcohol use disorder, severe, dependence (HCC)   Bipolar disorder, current episode mixed, moderate (HCC)   Alcoholic cirrhosis of liver with ascites (HCC)   Multiple sclerosis (HCC)   COPD (chronic obstructive pulmonary disease) (HCC)   Atrial fibrillation with rapid ventricular response (HCC)   Hypokalemia and hypomagnesemia:  - improving and replaced today.  Continue to hold diuretics until her electrolytes normalize.    Severe ETOH dependence.  Failed multiple rehab admissions.  High risk for complications from withdrawal including seizures.  Continue with CIWA protocol.  APS consult requested. CMS consulted for rehab options.    Atrial fibrillation with RVR:  - rate controlled.  - continue with oral cardizem.  She is not a candidate for [facility] given her ETOH dependence and significant fall risk as well as non-compliance   COPD  On [place] oxygen.  No wheezing heard. Continue with neb treatments.    MS Recommend outpatient follow up with neurology.     DVT prophylaxis: lovenox.  Code Status: full code.  Family Communication: none at bedside Disposition Plan: pending clinical improvement.     Consultants:   None.    Procedures: None.   Antimicrobials:None.   Subjective: No chest pain or sob. Wants to go home.   Objective: Vitals:   05/06/18 0600 05/06/18 0700 05/06/18 0917 05/06/18 0945  BP: (!) 107/40 (!) 100/56 113/60   Pulse: 72 78    Resp: 15 (!) 21    Temp:      TempSrc:      SpO2: 100% 99%  99%  Weight:      Height:        Intake/Output Summary (Last 24 hours) at 05/06/2018 1157 Last data filed at 05/06/2018 0400 Gross per 24 hour  Intake 2432.09 ml  Output 300 ml  Net 2132.09 ml   Filed Weights   05/05/18 1513  Weight: 80 kg    Examination:  General exam: Appears calm and comfortable on 3 to 4 lit of Oxygen.  Respiratory system: Clear to auscultation. Respiratory effort normal. Cardiovascular system: S1 & S2 heard, RRR. No JVD,  Gastrointestinal system: Abdomen is nondistended, soft and nontender. No organomegaly or masses felt. Normal bowel sounds heard. Central nervous system: Alert and oriented. No focal neurological deficits. Extremities: Symmetric 5 x 5 power. Skin: No rashes, lesions or ulcers Psychiatry:  Mood & affect appropriate.     Data Reviewed: I have personally reviewed following labs and imaging studies  CBC: Recent Labs  Lab 05/02/18 1548 05/03/18 1436 05/05/18 0703 05/06/18 0231  WBC 3.7* 5.8 3.9* 3.5*  NEUTROABS 2.3 3.9 2.1  --  HGB 8.9* 9.0* 9.7* 8.5*  HCT 31.8* 32.6* 34.1* 31.3*  MCV 83.5 82.7 80.8 83.2  PLT 173 194 175 130*   Basic Metabolic Panel: Recent Labs  Lab 05/02/18 1548 05/03/18 1436 05/05/18 0703 05/05/18 1539 05/06/18 0231  NA 138 136 139 136 136  K 2.7* 3.6 <2.0* 2.3* 3.3*  CL 94* 95* 86* 87* 92*  CO2 31 30 39* 38* 36*  GLUCOSE 81 109* 108* 99 82  BUN 5* <5* <5* <5* <5*  CREATININE 0.42* 0.34* 0.44 0.41* 0.42*  CALCIUM 7.6* 7.6* 7.1* 6.7* 7.2*  MG  --   --  1.2* 1.7  --    GFR: Estimated Creatinine Clearance: 75.1 mL/min (A) (by C-G formula based on SCr of 0.42 mg/dL (L)).  Liver Function Tests: Recent Labs  Lab 05/05/18 0703 05/06/18 0231  AST 47* 35  ALT 25 20  ALKPHOS 105 94  BILITOT 1.4* 2.0*  PROT 6.7 5.7*  ALBUMIN 3.3* 2.8*   Recent Labs  Lab 05/05/18 0703  LIPASE 24   No results for input(s): AMMONIA in the last 168 hours. Coagulation Profile: Recent Labs  Lab 05/05/18 0703  INR 1.5*   Cardiac Enzymes: No results for input(s): CKTOTAL, CKMB, CKMBINDEX, TROPONINI in the last 168 hours. BNP (last 3 results) No results for input(s): PROBNP in the last 8760 hours. HbA1C: No results for input(s): HGBA1C in the last 72 hours. CBG: No results for input(s): GLUCAP in the last 168 hours. Lipid Profile: No results for input(s): CHOL, HDL, LDLCALC, TRIG, CHOLHDL, LDLDIRECT in the last 72 hours. Thyroid Function Tests: No results for input(s): TSH, T4TOTAL, FREET4, T3FREE, THYROIDAB in the last 72 hours. Anemia Panel: No results for input(s): VITAMINB12, FOLATE, FERRITIN, TIBC, IRON, RETICCTPCT in the last 72 hours. Sepsis Labs: No results for input(s): PROCALCITON, LATICACIDVEN in the last 168 hours.  Recent Results (from the past 240 hour(s))  MRSA PCR Screening     Status: None   Collection Time: 05/05/18 11:09 AM  Result Value Ref Range Status   MRSA by PCR NEGATIVE NEGATIVE Final    Comment:        The GeneXpert MRSA Assay (FDA approved for NASAL specimens only), is one component of a comprehensive MRSA colonization surveillance program. It is not intended to diagnose MRSA infection nor to guide or monitor treatment for MRSA infections. Performed at [facility], [address] [place], [place] [address]          Radiology Studies: Dg Abdomen Acute W/chest  Result Date: 05/05/2018 CLINICAL DATA:  Vomiting, acute generalized abdominal pain. EXAM: DG ABDOMEN ACUTE W/ 1V CHEST COMPARISON:  CT scan of April 27, 2018. FINDINGS: There is no evidence of dilated bowel loops or free intraperitoneal air. No  radiopaque calculi or other significant radiographic abnormality is seen. Mild cardiomegaly is noted. Both lungs are clear. IMPRESSION: No evidence of bowel obstruction or ileus. No acute cardiopulmonary disease. Electronically Signed   By: Lupita Raider, M.D.   On: 05/05/2018 08:48        Scheduled Meds: . diltiazem  120 mg Oral Daily  . enoxaparin (LOVENOX) injection  40 mg Subcutaneous Q24H  . folic acid  1 mg Oral Daily  . gabapentin  200 mg Oral TID  . ipratropium-albuterol  3 mL Nebulization BID  . lactulose  30 g Oral TID  . magnesium oxide  400 mg Oral Daily  . mometasone-formoterol  2 puff Inhalation BID  . multivitamin with minerals  1 tablet Oral Daily  . pantoprazole  40 mg Oral Daily  . potassium chloride SA  40 mEq Oral BID  . thiamine  100 mg Oral Daily   Or  . thiamine  100 mg Intravenous Daily   Continuous Infusions: . 0.9 % NaCl with KCl 20 mEq / L 100 mL/hr at 05/06/18 0908     LOS: 0 days    Time spent: 35 minutes    Kathlen Mody, MD [facility] Pager [phone]  If 7PM-7AM, please contact night-coverage [email or web address] Password [facility] 05/06/2018, 11:57 AM

## 2018-05-06 NOTE — Progress Notes (Signed)
Pt has arrived to Room 1517 from ICU.  Alert and calm.

## 2018-05-07 ENCOUNTER — Emergency Department (HOSPITAL_COMMUNITY)
Admission: EM | Admit: 2018-05-07 | Discharge: 2018-05-08 | Disposition: A | Discharge status: Home / Self Care | Payer: Self-pay | Attending: Emergency Medicine | Admitting: Emergency Medicine

## 2018-05-07 ENCOUNTER — Other Ambulatory Visit: Payer: Self-pay

## 2018-05-07 DIAGNOSIS — Z79899 Other long term (current) drug therapy: Secondary | ICD-10-CM | POA: Insufficient documentation

## 2018-05-07 DIAGNOSIS — R5381 Other malaise: Secondary | ICD-10-CM | POA: Insufficient documentation

## 2018-05-07 DIAGNOSIS — E876 Hypokalemia: Secondary | ICD-10-CM | POA: Insufficient documentation

## 2018-05-07 DIAGNOSIS — Y905 Blood alcohol level of 100-119 mg/100 ml: Secondary | ICD-10-CM | POA: Insufficient documentation

## 2018-05-07 DIAGNOSIS — F17210 Nicotine dependence, cigarettes, uncomplicated: Secondary | ICD-10-CM | POA: Insufficient documentation

## 2018-05-07 DIAGNOSIS — J449 Chronic obstructive pulmonary disease, unspecified: Secondary | ICD-10-CM | POA: Insufficient documentation

## 2018-05-07 DIAGNOSIS — F1020 Alcohol dependence, uncomplicated: Secondary | ICD-10-CM | POA: Insufficient documentation

## 2018-05-07 LAB — COMPREHENSIVE METABOLIC PANEL
ALK PHOS: 105 U/L (ref 38–126)
ALT: 19 U/L (ref 0–44)
AST: 31 U/L (ref 15–41)
Albumin: 3 g/dL — ABNORMAL LOW (ref 3.5–5.0)
Anion gap: 6 (ref 5–15)
BILIRUBIN TOTAL: 0.9 mg/dL (ref 0.3–1.2)
BUN: <5 mg/dL — ABNORMAL LOW (ref 6–20)
CO2: 29 mmol/L (ref 22–32)
CREATININE: 0.46 mg/dL (ref 0.44–1.00)
Calcium: 8.4 mg/dL — ABNORMAL LOW (ref 8.9–10.3)
Chloride: 104 mmol/L (ref 98–111)
GFR calc Af Amer: >60 mL/min (ref >60)
GFR calc non Af Amer: >60 mL/min (ref >60)
Glucose, Bld: 101 mg/dL — ABNORMAL HIGH (ref 70–99)
Potassium: 3.9 mmol/L (ref 3.5–5.1)
Sodium: 139 mmol/L (ref 135–145)
TOTAL PROTEIN: 6.2 g/dL — AB (ref 6.5–8.1)

## 2018-05-07 LAB — CBC WITH DIFFERENTIAL/PLATELET
Abs Immature Granulocytes: 0.02 10*3/uL (ref 0.00–0.07)
Basophils Absolute: 0.1 10*3/uL (ref 0.0–0.1)
Basophils Relative: 1 %
EOS ABS: 0.1 10*3/uL (ref 0.0–0.5)
Eosinophils Relative: 1 %
HCT: 33.2 % — ABNORMAL LOW (ref 36.0–46.0)
HEMOGLOBIN: 9 g/dL — AB (ref 12.0–15.0)
Immature Granulocytes: 0 %
Lymphocytes Relative: 16 %
Lymphs Abs: 0.9 10*3/uL (ref 0.7–4.0)
MCH: 22.7 pg — ABNORMAL LOW (ref 26.0–34.0)
MCHC: 27.1 g/dL — ABNORMAL LOW (ref 30.0–36.0)
MCV: 83.6 fL (ref 80.0–100.0)
Monocytes Absolute: 0.6 10*3/uL (ref 0.1–1.0)
Monocytes Relative: 11 %
Neutro Abs: 3.7 10*3/uL (ref 1.7–7.7)
Neutrophils Relative %: 71 %
Platelets: 142 10*3/uL — ABNORMAL LOW (ref 150–400)
RBC: 3.97 MIL/uL (ref 3.87–5.11)
RDW: 21.3 % — ABNORMAL HIGH (ref 11.5–15.5)
WBC: 5.3 10*3/uL (ref 4.0–10.5)
nRBC: 0 % (ref 0.0–0.2)

## 2018-05-07 LAB — MAGNESIUM: Magnesium: 1.8 mg/dL (ref 1.7–2.4)

## 2018-05-07 LAB — PHOSPHORUS: Phosphorus: 2.3 mg/dL — ABNORMAL LOW (ref 2.5–4.6)

## 2018-05-07 LAB — CALCIUM, IONIZED: Calcium, Ionized, Serum: 3.7 mg/dL — ABNORMAL LOW (ref 4.5–5.6)

## 2018-05-07 LAB — ETHANOL: Alcohol, Ethyl (B): 107 mg/dL — ABNORMAL HIGH (ref <10)

## 2018-05-07 MED ORDER — MAGNESIUM SULFATE 2 GM/50ML IV SOLN
2.0000 g | Freq: Once | INTRAVENOUS | Status: AC
  Administered 2018-05-07: 2 g via INTRAVENOUS
  Filled 2018-05-07: qty 50

## 2018-05-07 MED ORDER — ZOLPIDEM TARTRATE 5 MG PO TABS
5.0000 mg | ORAL_TABLET | Freq: Once | ORAL | Status: AC
  Administered 2018-05-07: 5 mg via ORAL
  Filled 2018-05-07: qty 1

## 2018-05-07 MED ORDER — SODIUM CHLORIDE 0.9 % IV SOLN
Freq: Once | INTRAVENOUS | Status: AC
  Administered 2018-05-07: 20:00:00 via INTRAVENOUS

## 2018-05-07 MED ORDER — POTASSIUM PHOSPHATE MONOBASIC 500 MG PO TABS
1000.0000 mg | ORAL_TABLET | Freq: Once | ORAL | Status: AC
  Administered 2018-05-07: 1000 mg via ORAL
  Filled 2018-05-07: qty 2

## 2018-05-07 NOTE — ED Provider Notes (Addendum)
[facility]-EMERGENCY DEPT Provider Note   CSN: [id number] Arrival date & time: 05/07/18  1845    History   Chief Complaint Chief Complaint  Patient presents with  . Hemorrhoids    HPI Anne Black is a 58 y.o. female.     HPI   58 yo F with PMHx cirrhosis, chronic alcoholism, bipolar d/o, h/o AFib RVR, s/p recent AMA discharge this mornign here with persistent nausea, vomiting, fatigue. Pt had bene hospitalized w/ severe electrolyte derangements and AFib RVR. Was feeling better per her report but went home. Admits to having "a little" to drink when she got home but since then has had persistent nausea, vomiting, and fatigue. Unable to eat/drink. Called the nurse line and was advised to come back in. Denies any pain. No known fevers. She denies cough. She otherwise is unwilling to provide any additional history, states just "look it up."  Past Medical History:  Diagnosis Date  . ADHD (attention deficit hyperactivity disorder)   . Alcoholic cirrhosis (HCC)    with ascites  . Alcoholism (HCC)   . Allergies   . Anxiety   . Arthritis   . Atrial fibrillation with RVR (HCC)   . Bipolar disorder (HCC)   . COPD (chronic obstructive pulmonary disease) (HCC)    wears 2L chronic O2  . Dyspnea   . GERD (gastroesophageal reflux disease)   . History of hiatal hernia   . Hypokalemia   . Migraine   . Multiple sclerosis (HCC)   . Narcolepsy   . Osteoarthritis of knee   . Osteoporosis   . Pneumonia     Patient Active Problem List   Diagnosis Date Noted  . Alcohol dependence (HCC) 05/06/2018  . Electrolyte disturbance 05/05/2018  . Alcohol withdrawal delirium (HCC) 04/28/2018  . Weakness generalized 04/27/2018  . Gait abnormality 04/24/2018  . Chronic hypoxemic respiratory failure (HCC) 04/11/2018  . Atrial fibrillation with rapid ventricular response (HCC) 03/23/2018  . Head injury   . Fall 03/19/2018  . Syncope 03/19/2018  . Orbital fracture  03/19/2018  . Scalp laceration 03/19/2018  . Acute respiratory failure with hypoxia and hypercarbia (HCC) 03/19/2018  . Abdominal tenderness 03/19/2018  . Acute on chronic respiratory failure with hypoxia (HCC) 03/17/2018  . Alcohol abuse with intoxication (HCC) 03/17/2018  . ETOH abuse   . Normocytic anemia 03/16/2018  . Acute metabolic encephalopathy 03/16/2018  . Lower leg edema 03/16/2018  . Migraine 03/16/2018  . Community acquired pneumonia of left lower lobe of lung (HCC)   . Weakness 08/21/2017  . COPD exacerbation (HCC) 06/12/2017  . Chest pain with moderate risk for cardiac etiology 05/30/2017  . Abnormal LFTs 05/30/2017  . Tachycardia 05/30/2017  . COPD (chronic obstructive pulmonary disease) (HCC) 04/19/2017  . Right rib fracture 04/15/2017  . Abdominal wall cellulitis 12/30/2016  . S/P exploratory laparotomy 12/30/2016  . Distal radius fracture, left 09/09/2016  . Thrombocytopenia (HCC) 09/09/2016  . Chest x-ray abnormality   . Umbilical hernia without obstruction and without gangrene 05/12/2016  . Itching 02/24/2016  . Ventral hernia without obstruction or gangrene 02/24/2016  . Palliative care encounter   . Ascites   . Tachypnea   . Atrial flutter with rapid ventricular response (HCC)   . Hypomagnesemia   . Hypoxia   . Dehydration 01/22/2016  . Low back ache 12/30/2015  . Non-intractable vomiting with nausea   . Alcohol abuse with alcohol-induced mood disorder (HCC) 12/15/2015  . Acute alcoholism (HCC)   . Abdominal  distension 11/04/2015  . Alcohol abuse   . Dyspnea   . Acute respiratory failure (HCC) 10/15/2015  . Ascites due to alcoholic cirrhosis (HCC) 10/15/2015  . Osteoporosis   . Multiple sclerosis (HCC)   . Alcoholic cirrhosis of liver with ascites (HCC) 08/22/2015  . Bipolar disorder, current episode mixed, moderate (HCC) 08/01/2015  . Alcohol use disorder, severe, dependence (HCC) 07/31/2015  . Macrocytic anemia- due to alcohol abuse with normal  B12 & folate levels 05/31/2015  . Severe protein-calorie malnutrition (HCC) 05/31/2015  . Hypokalemia 05/26/2015  . Encephalopathy, hepatic (HCC) 05/26/2015  . Alcohol withdrawal (HCC) 05/18/2015  . Abdominal pain 05/18/2015  . Anxiety 05/18/2015  . Stimulant abuse (HCC) 12/27/2014  . Nicotine abuse 12/27/2014  . Hyperprolactinemia (HCC) 11/15/2014  . Dyslipidemia   . Alcohol use disorder, moderate, dependence (HCC) 10/30/2014  . Tobacco abuse 01/23/2014  . Bipolar 1 disorder (HCC) 04/07/2013  . Noncompliance with therapeutic plan 04/04/2013  . Hereditary and idiopathic peripheral neuropathy 05/01/2012  . Depression 05/01/2012  . GERD (gastroesophageal reflux disease) 05/01/2012  . Osteoarthrosis, unspecified whether generalized or localized, involving lower leg 05/01/2012  . Hyponatremia 07/01/2011    Past Surgical History:  Procedure Laterality Date  . ABDOMINAL WALL DEFECT REPAIR N/A 12/30/2016   Procedure: EXPLORATORY LAPAROTOMY WITH REPAIR ABDOMINAL WALL VENTRAL HERNIA;  Surgeon: [name], MD;  Location: [facility] OR;  Service: General;  Laterality: N/A;  . APPLICATION OF WOUND VAC N/A 12/30/2016   Procedure: APPLICATION OF WOUND VAC;  Surgeon: [name], MD;  Location: [facility] OR;  Service: General;  Laterality: N/A;  . CESAREAN SECTION  [phone]  . FRACTURE SURGERY    . HERNIA REPAIR    . MYRINGOTOMY WITH TUBE PLACEMENT Bilateral   . ORIF WRIST FRACTURE Left 09/09/2016   Procedure: OPEN REDUCTION INTERNAL FIXATION (ORIF) LEFT WRIST FRACTURE, LEFT CARPAL TUNNEL RELEASE;  Surgeon: Dominica Severin, MD;  Location: [facility] ORS;  Service: Orthopedics;  Laterality: Left;  . TONSILLECTOMY       OB History   No obstetric history on file.      Home Medications    Prior to Admission medications   Medication Sig Start Date End Date Taking? Authorizing Provider  acetaminophen (TYLENOL) 500 MG tablet Take 1 tablet (500 mg total) by mouth every 6 (six) hours as needed for mild  pain. Patient taking differently: Take 1,000 mg by mouth every 6 (six) hours as needed for mild pain.  03/28/18  Yes Rolly Salter, MD  albuterol (PROVENTIL HFA;VENTOLIN HFA) 108 (90 Base) MCG/ACT inhaler Inhale 2 puffs into the lungs every 4 (four) hours as needed for wheezing or shortness of breath.   Yes [provider]  aluminum-magnesium hydroxide-simethicone (MAALOX) 200-200-20 MG/5ML SUSP Take 30 mLs by mouth 4 (four) times daily as needed (indigestion).    Yes [provider]  chlordiazePOXIDE (LIBRIUM) 25 MG capsule 50mg  PO TID x 1D, then 25-50mg  PO BID X 1D, then 25-50mg  PO QD X 1D 05/03/18  Yes Henderly, Britni A, PA-C  diltiazem (CARDIZEM CD) 120 MG 24 hr capsule Take 1 capsule (120 mg total) by mouth daily for 30 days. 04/14/18 05/14/18 Yes Tegeler, Canary Brim, MD  Fluticasone-Salmeterol [facility] INHUB) 250-50 MCG/DOSE AEPB Inhale 2 puffs into the lungs 2 (two) times daily.    Yes [provider]  furosemide (LASIX) 40 MG tablet Take 40 mg twice daily till 04/02/2018, after that take 40 mg daily. Patient taking differently: Take 40 mg by mouth daily. Take 40 mg twice  daily till 04/02/2018, after that take 40 mg daily. 04/14/18  Yes Tegeler, Canary Brim, MD  gabapentin (NEURONTIN) 100 MG capsule Take 2 capsules (200 mg total) by mouth 3 (three) times daily for 30 days. 05/03/18 06/02/18 Yes Henderly, Britni A, PA-C  hydrOXYzine (ATARAX/VISTARIL) 25 MG tablet Take 1 tablet (25 mg total) by mouth daily as needed for up to 30 doses (anxiety). Patient taking differently: Take 25 mg by mouth daily as needed for anxiety (withdrawal).  04/14/18  Yes Tegeler, Canary Brim, MD  ipratropium-albuterol (DUONEB) 0.5-2.5 (3) MG/3ML SOLN Take 3 mLs by nebulization 2 (two) times daily. AND every 4 hours as needed for wheezing/shortness of breath   Yes [provider]  lactulose (CHRONULAC) 10 GM/15ML solution Take 45 mLs (30 g total) by mouth 3 (three) times daily for 30 days.  04/14/18 05/14/18 Yes Tegeler, Canary Brim, MD  lidocaine (LIDODERM) 5 % Place 1 patch onto the skin daily. Remove & Discard patch within 12 hours or as directed by MD 05/03/18  Yes Henderly, Britni A, PA-C  magnesium oxide (MAG-OX) 400 MG tablet Take 400 mg by mouth daily.   Yes [provider]  pantoprazole (PROTONIX) 40 MG tablet Take 1 tablet (40 mg total) by mouth daily for 30 days. 04/14/18 05/14/18 Yes Tegeler, Canary Brim, MD  potassium chloride SA (K-DUR,KLOR-CON) 20 MEQ tablet Take 2 tablets (40 mEq total) by mouth 2 (two) times daily. 04/19/18  Yes Bethel Born, PA-C  rizatriptan (MAXALT) 10 MG tablet Take 10 mg by mouth as needed for migraine. May repeat in 2 hours if needed   Yes [provider]  rizatriptan (MAXALT) 5 MG tablet Take 5 mg by mouth as needed for migraine. May repeat in 2 hours if needed; not to exceed 30mg  in 24 hours   Yes [provider]  spironolactone (ALDACTONE) 25 MG tablet Take 1 tablet (25 mg total) by mouth daily for 30 days. 04/14/18 05/14/18 Yes Tegeler, Canary Brim, MD    Family History Family History  Problem Relation Age of Onset  . Arrhythmia Mother   . Neuropathy Mother   . Coronary artery disease Mother   . Heart disease Mother   . Heart disease Father   . Hypertension Father   . Heart attack Father   . Multiple sclerosis Maternal Aunt     Social History Social History   Tobacco Use  . Smoking status: Current Every Day Smoker    Packs/day: 1.00    Years: 36.00    Pack years: 36.00    Types: Cigarettes  . Smokeless tobacco: Never Used  Substance Use Topics  . Alcohol use: Yes    Comment: 1 bottle of wine per day per patient.   . Drug use: No     Allergies   Patient has no known allergies.   Review of Systems Review of Systems  Constitutional: Positive for chills and fatigue. Negative for fever.  HENT: Negative for congestion and rhinorrhea.   Eyes: Negative for visual disturbance.  Respiratory:  Negative for cough, shortness of breath and wheezing.   Cardiovascular: Negative for chest pain and leg swelling.  Gastrointestinal: Positive for nausea. Negative for abdominal pain, diarrhea and vomiting.  Genitourinary: Negative for dysuria and flank pain.  Musculoskeletal: Negative for neck pain and neck stiffness.  Skin: Negative for rash and wound.  Allergic/Immunologic: Negative for immunocompromised state.  Neurological: Positive for weakness. Negative for syncope and headaches.  All other systems reviewed and are negative.  Physical Exam Updated Vital Signs BP (!) 119/58 (BP Location: Left Arm)   Pulse 87   Temp 98.3 F (36.8 C) (Oral)   Resp 17   Ht  (1.575 m)   Wt 86.2 kg   SpO2 99%   BMI 34.75 kg/m   Physical Exam Vitals signs and nursing note reviewed.  Constitutional:      General: She is not in acute distress.    Appearance: She is well-developed.     Comments: Chronically ill-appearing  HENT:     Head: Normocephalic and atraumatic.  Eyes:     Conjunctiva/sclera: Conjunctivae normal.  Neck:     Musculoskeletal: Neck supple.  Cardiovascular:     Rate and Rhythm: Normal rate and regular rhythm.     Heart sounds: Normal heart sounds. No murmur. No friction rub.  Pulmonary:     Effort: Pulmonary effort is normal. No respiratory distress.     Breath sounds: Normal breath sounds. No wheezing or rales.  Abdominal:     General: There is no distension.     Palpations: Abdomen is soft.     Tenderness: There is no abdominal tenderness.  Skin:    General: Skin is warm.     Capillary Refill: Capillary refill takes less than 2 seconds.     Comments: Multiple diffuse ecchymoses in various stages of healing  Neurological:     Mental Status: She is alert and oriented to person, place, and time.     Motor: No abnormal muscle tone.      ED Treatments / Results  Labs (all labs ordered are listed, but only abnormal results are displayed) Labs Reviewed   CBC WITH DIFFERENTIAL/PLATELET - Abnormal; Notable for the following components:      Result Value   Hemoglobin 9.0 (*)    HCT 33.2 (*)    MCH 22.7 (*)    MCHC 27.1 (*)    RDW 21.3 (*)    Platelets 142 (*)    All other components within normal limits  COMPREHENSIVE METABOLIC PANEL - Abnormal; Notable for the following components:   Glucose, Bld 101 (*)    BUN <5 (*)    Calcium 8.4 (*)    Total Protein 6.2 (*)    Albumin 3.0 (*)    All other components within normal limits  PHOSPHORUS - Abnormal; Notable for the following components:   Phosphorus 2.3 (*)    All other components within normal limits  ETHANOL - Abnormal; Notable for the following components:   Alcohol, Ethyl (B) 107 (*)    All other components within normal limits  MAGNESIUM    EKG EKG Interpretation  Date/Time:  Sunday May 07 2018 20:00:00 EDT Ventricular Rate:  96 PR Interval:    QRS Duration: 95 QT Interval:  353 QTC Calculation: 447 R Axis:   89 Text Interpretation:  Sinus rhythm Borderline T abnormalities, anterior leads Since last EKG, sinus rhythm has replaced likely AFib/EAT Otherwise no significant changes Confirmed by Shaune Pollack [phone]) on 05/07/2018 9:03:06 PM   Radiology No results found.  Procedures Procedures (including critical care time)  Medications Ordered in ED Medications  0.9 %  sodium chloride infusion ( Intravenous New Bag/Given 05/07/18 2012)  magnesium sulfate IVPB 2 g 50 mL (2 g Intravenous New Bag/Given 05/07/18 2204)  potassium phosphate (monobasic) (K-PHOS ORIGINAL) tablet 1,000 mg (1,000 mg Oral Given 05/07/18 2233)     Initial Impression / Assessment and Plan / ED Course  I have reviewed the  triage vital signs and the nursing notes.  Pertinent labs & imaging results that were available during my care of the patient were reviewed by me and considered in my medical decision making (see chart for details).  Clinical Course as of May 06 2308  Sun May 07, 2018   2103 58 yo F with extensive PMHx s/p recent admission for significant elyte derangements here with ongoing n/v/d and poor PO intake. Left AMA just this morning. VSS on arrival, pt non-toxic on exam. Abdomen soft. Labs repeated.   [CI]    Clinical Course User Index [CI] Shaune Pollack, MD       Labs are actually improved from prior.  Hgb at baseline. Renal function is at baseline. Etoh 107 - h/o chronic alcoholism. Potassium, mag replaced here. Pt states she'd like to return home and plans to continue drinking. Per review of labs and recent admission, no apparent indication for admission today. VSS. She'd like to return home which I think is reasonable. Her abdomen is soft, NT, ND, recent had neg imaging, and normal labs - doubt intra-abd pathology or emergent condition.  Final Clinical Impressions(s) / ED Diagnoses   Final diagnoses:  Chronic alcoholism [facility])  Physical deconditioning  Hypokalemia    ED Discharge Orders    None       Shaune Pollack, MD 05/07/18 2310    Shaune Pollack, MD 05/07/18 2312

## 2018-05-07 NOTE — Progress Notes (Signed)
Patient left AMA around 1130am.  Patient had signed paper with night shift RN. Day RN confirmed that patient signature was present on the paper and confirmed a second time with the patient.   I signed AMA paper and filed with shadow chart.   MD notified that patient left AMA.

## 2018-05-07 NOTE — ED Notes (Signed)
[facility] contacted for transport. Paperwork printed at nursing station.

## 2018-05-07 NOTE — Progress Notes (Signed)
Pt repeatedly rang out all night for miscellaneous things. Pt stated that she was lonely and just wanted to have someone for comfort. Sitter was given to patient around 0300. Pt stated around 0630 that she wanted to leave [facility] & papers were filled out. Pt, then, requested help with getting up. Sitter was helping patient when patient became irate and Engineer, petroleum with telebox, pulled out nasal cannula, and removed IV.

## 2018-05-07 NOTE — Discharge Summary (Signed)
Physician Discharge Summary  SADE HOLLON ZOX:[id number] DOB: 19-Jul-1960 DOA: 05/05/2018  PCP: Patient, No Pcp Per  Admit date: 05/05/2018 Discharge date: 05/07/2018  Pt left AMA.   Brief/Interim Summary:  Anne Black a 58 y.o.femalewith medical history significant ofnarcolepsy; MS; migraines; bipolar; afib; alcoholic cirrhosis with ascites; and ADHD presenting with headache.She has been to rehab, last was in December; she is interested in going back. There is a place [facility] in [place] that one of her daughters found - but because of COVID they aren't taking patients. There is a place in [place] where she has been, as well. She usually wears 2L home O2 and this is unchanged. She has a chronic dry cough. she was admitted for severe electrolyte deficiencies.    Discharge Diagnoses:  Principal Problem:   Electrolyte disturbance Active Problems:   Nicotine abuse   Alcohol use disorder, severe, dependence (HCC)   Bipolar disorder, current episode mixed, moderate (HCC)   Alcoholic cirrhosis of liver with ascites (HCC)   Multiple sclerosis (HCC)   COPD (chronic obstructive pulmonary disease) (HCC)   Atrial fibrillation with rapid ventricular response (HCC)   Alcohol dependence (HCC)    Discharge Instructions   Allergies as of 05/07/2018   No Known Allergies     Medication List    ASK your doctor about these medications   acetaminophen 500 MG tablet Commonly known as:  TYLENOL Take 1 tablet (500 mg total) by mouth every 6 (six) hours as needed for mild pain.   albuterol 108 (90 Base) MCG/ACT inhaler Commonly known as:  PROVENTIL HFA;VENTOLIN HFA Inhale 2 puffs into the lungs every 4 (four) hours as needed for wheezing or shortness of breath.   aluminum-magnesium hydroxide-simethicone 200-200-20 MG/5ML Susp Commonly known as:  MAALOX Take 30 mLs by mouth 4 (four) times daily as needed (indigestion).   chlordiazePOXIDE 25 MG capsule Commonly known as:   LIBRIUM 50mg  PO TID x 1D, then 25-50mg  PO BID X 1D, then 25-50mg  PO QD X 1D   diltiazem 120 MG 24 hr capsule Commonly known as:  CARDIZEM CD Take 1 capsule (120 mg total) by mouth daily for 30 days.   furosemide 40 MG tablet Commonly known as:  LASIX Take 40 mg twice daily till 04/02/2018, after that take 40 mg daily.   gabapentin 100 MG capsule Commonly known as:  NEURONTIN Take 2 capsules (200 mg total) by mouth 3 (three) times daily for 30 days.   hydrOXYzine 25 MG tablet Commonly known as:  ATARAX/VISTARIL Take 1 tablet (25 mg total) by mouth daily as needed for up to 30 doses (anxiety).   ipratropium-albuterol 0.5-2.5 (3) MG/3ML Soln Commonly known as:  DUONEB Take 3 mLs by nebulization 2 (two) times daily. AND every 4 hours as needed for wheezing/shortness of breath   lactulose 10 GM/15ML solution Commonly known as:  CHRONULAC Take 45 mLs (30 g total) by mouth 3 (three) times daily for 30 days.   lidocaine 5 % Commonly known as:  Lidoderm Place 1 patch onto the skin daily. Remove & Discard patch within 12 hours or as directed by MD   magnesium oxide 400 MG tablet Commonly known as:  MAG-OX Take 400 mg by mouth daily.   pantoprazole 40 MG tablet Commonly known as:  PROTONIX Take 1 tablet (40 mg total) by mouth daily for 30 days.   potassium chloride SA 20 MEQ tablet Commonly known as:  K-DUR,KLOR-CON Take 2 tablets (40 mEq total) by mouth 2 (two) times  daily.   rizatriptan 5 MG tablet Commonly known as:  MAXALT Take 5 mg by mouth as needed for migraine. May repeat in 2 hours if needed; not to exceed 30mg  in 24 hours   rizatriptan 10 MG tablet Commonly known as:  MAXALT Take 10 mg by mouth as needed for migraine. May repeat in 2 hours if needed   spironolactone 25 MG tablet Commonly known as:  ALDACTONE Take 1 tablet (25 mg total) by mouth daily for 30 days.   Wixela Inhub 250-50 MCG/DOSE Aepb Generic drug:  Fluticasone-Salmeterol Inhale 2 puffs into the  lungs 2 (two) times daily.       No Known Allergies  Consultations:   Procedures/Studies: Dg Chest 2 View  Result Date: 04/14/2018 CLINICAL DATA:  Hypoxia, abnormal breath sounds EXAM: CHEST - 2 VIEW COMPARISON:  Chest CT 04/11/2018 FINDINGS: Mild cardiomegaly. No confluent opacities, effusions or edema. No acute bony abnormality. IMPRESSION: Cardiomegaly.  No active disease. Electronically Signed   By: Charlett Nose M.D.   On: 04/14/2018 09:56   Dg Chest 2 View  Result Date: 04/11/2018 CLINICAL DATA:  58 year old female with history of alcohol abuse presenting with abdominal pain and nausea. EXAM: CHEST - 2 VIEW COMPARISON:  CT the abdomen and pelvis 04/09/2018. FINDINGS: Lung volumes are low. No consolidative airspace disease. No pleural effusions. No suspicious appearing pulmonary nodules or masses. No pneumothorax. No evidence of pulmonary edema. Heart size is mildly enlarged. Upper mediastinal contours are within normal limits. IMPRESSION: 1. No radiographic evidence of acute cardiopulmonary disease. 2. Mild cardiomegaly. Electronically Signed   By: Trudie Reed M.D.   On: 04/11/2018 09:39   Dg Thoracic Spine 2 View  Result Date: 04/17/2018 CLINICAL DATA:  Pain after fall. EXAM: THORACIC SPINE 2 VIEWS COMPARISON:  04/14/2018 CXR FINDINGS: There is no evidence of thoracic spine fracture. Alignment is normal. Mild multilevel disc space narrowing and minimal endplate spurring is seen. No other significant bone abnormalities are identified. IMPRESSION: Mild degenerative change along the thoracic spine without acute osseous appearing abnormality. Electronically Signed   By: Tollie Eth M.D.   On: 04/17/2018 00:55   Dg Lumbar Spine Complete  Result Date: 05/03/2018 CLINICAL DATA:  Acute low back pain after fall several days ago. EXAM: LUMBAR SPINE - COMPLETE 4+ VIEW COMPARISON:  CT scan of April 27, 2018. FINDINGS: There is no evidence of lumbar spine fracture. Alignment is normal.  Intervertebral disc spaces are maintained. IMPRESSION: Negative. Electronically Signed   By: Lupita Raider, M.D.   On: 05/03/2018 15:05   Ct Head Wo Contrast  Result Date: 05/03/2018 CLINICAL DATA:  Frequent falls.  Headache.  Head injury. EXAM: CT HEAD WITHOUT CONTRAST CT MAXILLOFACIAL WITHOUT CONTRAST CT CERVICAL SPINE WITHOUT CONTRAST TECHNIQUE: Multidetector CT imaging of the head, cervical spine, and maxillofacial structures were performed using the standard protocol without intravenous contrast. Multiplanar CT image reconstructions of the cervical spine and maxillofacial structures were also generated. COMPARISON:  CT head 04/24/2018 FINDINGS: CT HEAD FINDINGS Brain: Mild atrophy. Moderate chronic microvascular ischemic change in the white matter. No acute infarct, hemorrhage, or mass. Vascular: Negative for hyperdense vessel Skull: Negative for skull fracture Other: Right orbital floor fracture, see below CT MAXILLOFACIAL FINDINGS Osseous: Depressed fracture right orbital floor is unchanged and not acute. This appeared first on the CT face of 03/19/2018 approximately 9 mm defect in the orbital floor. No other facial fracture identified. Orbits: No orbital mass or edema. Right orbital floor fracture as above. Sinuses: Paranasal  sinuses clear.  No air-fluid level. Soft tissues: No significant soft tissue contusion or mass. CT CERVICAL SPINE FINDINGS Alignment: Normal Skull base and vertebrae: Negative for fracture Soft tissues and spinal canal: Negative for mass or soft tissue contusion Disc levels:  Disc spaces maintained.  Mild facet degeneration. Upper chest: Negative Other: None IMPRESSION: 1. No acute intracranial abnormality. Atrophy and chronic microvascular ischemia. 2. Nonacute depressed fracture right orbital floor unchanged from prior studies. Paranasal sinuses clear. No acute fracture 3. Negative for cervical spine fracture. Electronically Signed   By: Marlan Palau M.D.   On: 05/03/2018  15:33   Ct Head Wo Contrast  Result Date: 04/24/2018 CLINICAL DATA:  Head trauma. Neck and back pain. EXAM: CT HEAD WITHOUT CONTRAST CT CERVICAL SPINE WITHOUT CONTRAST TECHNIQUE: Multidetector CT imaging of the head and cervical spine was performed following the standard protocol without intravenous contrast. Multiplanar CT image reconstructions of the cervical spine were also generated. COMPARISON:  Head CT 04/18/2018 FINDINGS: CT HEAD FINDINGS Brain: There is no mass, hemorrhage or extra-axial collection. The size and configuration of the ventricles and extra-axial CSF spaces are normal. There is hypoattenuation of the periventricular white matter, most commonly indicating chronic ischemic microangiopathy. Vascular: No abnormal hyperdensity of the major intracranial arteries or dural venous sinuses. No intracranial atherosclerosis. Skull: The visualized skull base, calvarium and extracranial soft tissues are normal. Sinuses/Orbits: No fluid levels or advanced mucosal thickening of the visualized paranasal sinuses. No mastoid or middle ear effusion. The orbits are normal. CT CERVICAL SPINE FINDINGS Alignment: No static subluxation. Facets are aligned. Occipital condyles are normally positioned. Skull base and vertebrae: No acute fracture. Soft tissues and spinal canal: No prevertebral fluid or swelling. No visible canal hematoma. Disc levels: No advanced spinal canal or neural foraminal stenosis. Upper chest: No pneumothorax, pulmonary nodule or pleural effusion. Other: Normal visualized paraspinal cervical soft tissues. IMPRESSION: 1. No acute intracranial abnormality. 2. No acute fracture or static subluxation of the cervical spine. Electronically Signed   By: Deatra Robinson M.D.   On: 04/24/2018 05:23   Ct Head Wo Contrast  Result Date: 04/18/2018 CLINICAL DATA:  Altered mental status with recent fall. EXAM: CT HEAD WITHOUT CONTRAST TECHNIQUE: Contiguous axial images were obtained from the base of the  skull through the vertex without intravenous contrast. COMPARISON:  April 14, 2018 FINDINGS: Brain: There is mild diffuse atrophy. There is no intracranial mass, hemorrhage, extra-axial fluid collection, or midline shift. There is patchy small vessel disease throughout portions of the centra semiovale bilaterally, stable. No acute appearing infarct is evident. Vascular: No hyperdense vessel. Foci of calcification are noted in each carotid siphon region. Skull: The bony calvarium appears intact. Sinuses/Orbits: There is a retention cyst in the right maxillary antrum. There is mild mucosal thickening in anterior ethmoid air cells. Other visualized paranasal sinuses are clear. Orbits appear symmetric bilaterally. Other: Mastoids bilaterally are somewhat hypoplastic but clear. IMPRESSION: Stable atrophy with patchy periventricular small vessel disease. No acute infarct is demonstrable. No mass or hemorrhage. Foci of arterial vascular calcification noted. Areas of mild paranasal sinus disease noted. Electronically Signed   By: Bretta Bang III M.D.   On: 04/18/2018 13:19   Ct Head Wo Contrast  Result Date: 04/14/2018 CLINICAL DATA:  Headache, fall EXAM: CT HEAD WITHOUT CONTRAST TECHNIQUE: Contiguous axial images were obtained from the base of the skull through the vertex without intravenous contrast. COMPARISON:  03/23/2018 FINDINGS: Brain: Chronic small vessel disease changes throughout the deep white matter, stable. No  acute intracranial abnormality. Specifically, no hemorrhage, hydrocephalus, mass lesion, acute infarction, or significant intracranial injury. Vascular: No hyperdense vessel or unexpected calcification. Skull: No acute calvarial abnormality. Sinuses/Orbits: No acute finding. Other: None IMPRESSION: Chronic small vessel disease.  No acute intracranial abnormality. Electronically Signed   By: Charlett Nose M.D.   On: 04/14/2018 10:17   Ct Angio Chest Pe W And/or Wo Contrast  Result Date:  04/11/2018 CLINICAL DATA:  Difficulty breathing EXAM: CT ANGIOGRAPHY CHEST WITH CONTRAST TECHNIQUE: Multidetector CT imaging of the chest was performed using the standard protocol during bolus administration of intravenous contrast. Multiplanar CT image reconstructions and MIPs were obtained to evaluate the vascular anatomy. CONTRAST:  OMNIPAQUE IOHEXOL 350 MG/ML SOLN COMPARISON:  02/28/2018 FINDINGS: Cardiovascular: Thoracic aorta demonstrates a normal branching pattern. No aneurysmal dilatation or dissection is seen. Mild cardiac enlargement is noted. Pulmonary artery shows a normal branching pattern without intraluminal filling defect to suggest pulmonary embolism. Mediastinum/Nodes: Thoracic inlet is within normal limits. No sizable hilar or mediastinal adenopathy is noted. The esophagus as visualized is within normal limits. Lungs/Pleura: The lungs are well aerated bilaterally. No focal infiltrate or sizable effusion is seen. No parenchymal nodules are noted. Upper Abdomen: Cirrhotic change of the liver is noted with evidence of perihepatic ascites slightly increased when compared with the prior exam. Previously seen varices are again identified but not opacified due to the timing of the contrast bolus. The remainder of the upper abdomen is within normal limits. Musculoskeletal: Bilateral breast implants are noted. Degenerative changes of the thoracic spine are seen. Healed rib fractures are noted bilaterally. Review of the MIP images confirms the above findings. IMPRESSION: No evidence of pulmonary emboli. Cirrhotic change of the liver with slight increase in ascites when compared with the prior exam. Electronically Signed   By: Alcide Clever M.D.   On: 04/11/2018 14:15   Ct Cervical Spine Wo Contrast  Result Date: 05/03/2018 CLINICAL DATA:  Frequent falls.  Headache.  Head injury. EXAM: CT HEAD WITHOUT CONTRAST CT MAXILLOFACIAL WITHOUT CONTRAST CT CERVICAL SPINE WITHOUT CONTRAST TECHNIQUE:  Multidetector CT imaging of the head, cervical spine, and maxillofacial structures were performed using the standard protocol without intravenous contrast. Multiplanar CT image reconstructions of the cervical spine and maxillofacial structures were also generated. COMPARISON:  CT head 04/24/2018 FINDINGS: CT HEAD FINDINGS Brain: Mild atrophy. Moderate chronic microvascular ischemic change in the white matter. No acute infarct, hemorrhage, or mass. Vascular: Negative for hyperdense vessel Skull: Negative for skull fracture Other: Right orbital floor fracture, see below CT MAXILLOFACIAL FINDINGS Osseous: Depressed fracture right orbital floor is unchanged and not acute. This appeared first on the CT face of 03/19/2018 approximately 9 mm defect in the orbital floor. No other facial fracture identified. Orbits: No orbital mass or edema. Right orbital floor fracture as above. Sinuses: Paranasal sinuses clear.  No air-fluid level. Soft tissues: No significant soft tissue contusion or mass. CT CERVICAL SPINE FINDINGS Alignment: Normal Skull base and vertebrae: Negative for fracture Soft tissues and spinal canal: Negative for mass or soft tissue contusion Disc levels:  Disc spaces maintained.  Mild facet degeneration. Upper chest: Negative Other: None IMPRESSION: 1. No acute intracranial abnormality. Atrophy and chronic microvascular ischemia. 2. Nonacute depressed fracture right orbital floor unchanged from prior studies. Paranasal sinuses clear. No acute fracture 3. Negative for cervical spine fracture. Electronically Signed   By: Marlan Palau M.D.   On: 05/03/2018 15:33   Ct Cervical Spine Wo Contrast  Result Date: 04/24/2018 CLINICAL DATA:  Head trauma. Neck and back pain. EXAM: CT HEAD WITHOUT CONTRAST CT CERVICAL SPINE WITHOUT CONTRAST TECHNIQUE: Multidetector CT imaging of the head and cervical spine was performed following the standard protocol without intravenous contrast. Multiplanar CT image reconstructions  of the cervical spine were also generated. COMPARISON:  Head CT 04/18/2018 FINDINGS: CT HEAD FINDINGS Brain: There is no mass, hemorrhage or extra-axial collection. The size and configuration of the ventricles and extra-axial CSF spaces are normal. There is hypoattenuation of the periventricular white matter, most commonly indicating chronic ischemic microangiopathy. Vascular: No abnormal hyperdensity of the major intracranial arteries or dural venous sinuses. No intracranial atherosclerosis. Skull: The visualized skull base, calvarium and extracranial soft tissues are normal. Sinuses/Orbits: No fluid levels or advanced mucosal thickening of the visualized paranasal sinuses. No mastoid or middle ear effusion. The orbits are normal. CT CERVICAL SPINE FINDINGS Alignment: No static subluxation. Facets are aligned. Occipital condyles are normally positioned. Skull base and vertebrae: No acute fracture. Soft tissues and spinal canal: No prevertebral fluid or swelling. No visible canal hematoma. Disc levels: No advanced spinal canal or neural foraminal stenosis. Upper chest: No pneumothorax, pulmonary nodule or pleural effusion. Other: Normal visualized paraspinal cervical soft tissues. IMPRESSION: 1. No acute intracranial abnormality. 2. No acute fracture or static subluxation of the cervical spine. Electronically Signed   By: Deatra Robinson M.D.   On: 04/24/2018 05:23   Ct Abdomen Pelvis W Contrast  Result Date: 04/27/2018 CLINICAL DATA:  Acute generalized abdominal pain EXAM: CT ABDOMEN AND PELVIS WITH CONTRAST TECHNIQUE: Multidetector CT imaging of the abdomen and pelvis was performed using the standard protocol following bolus administration of intravenous contrast. CONTRAST:  ISOVUE-300 IOPAMIDOL (ISOVUE-300) INJECTION 61% COMPARISON:  02/28/2018 FINDINGS: Lower chest: Partially covered bilateral breast implants. Esophageal varices. Hepatobiliary: Steatotic, small and lobulated liver. Recanalized  periumbilical vein. Moderate ascites without loculation. Patent main portal vein. No masslike finding in the liver-a wispy area of hypervascularity in the lateral segment left liver has been stable since 2018.No evidence of biliary obstruction or stone. Pancreas: Unremarkable. Spleen: Mild enlargement in the setting of portal hypertension. No acute or focal finding Adrenals/Urinary Tract: Negative adrenals. No hydronephrosis or stone. Unremarkable bladder. Stomach/Bowel: No obstruction. No appendicitis. Mild, stable submucosal thickening of the proximal colon which is likely from portal hypertension. Vascular/Lymphatic: Periesophageal varices. Mild atherosclerotic calcification. No acute vascular finding. No mass or adenopathy. Reproductive:Negative. Other: No pneumoperitoneum Musculoskeletal: No acute finding. Fluid is present within the right ileus psoas bursa. IMPRESSION: 1. No acute finding compared to priors. 2. Cirrhosis with portal hypertension, esophageal varices, and moderate ascites. Electronically Signed   By: Marnee Spring M.D.   On: 04/27/2018 07:36   Dg Chest Port 1 View  Result Date: 04/24/2018 CLINICAL DATA:  Shortness of breath. EXAM: PORTABLE CHEST 1 VIEW COMPARISON:  CT chest 04/11/2018.  PA and lateral chest 01/25/2018. FINDINGS: The lungs are clear. Heart size is upper normal. No pneumothorax or pleural fluid. No acute or focal bony abnormality. IMPRESSION: No acute disease. Electronically Signed   By: Drusilla Kanner M.D.   On: 04/24/2018 11:07   Dg Chest Port 1 View  Result Date: 04/18/2018 CLINICAL DATA:  Shortness of breath, hypoxia, CHF. EXAM: PORTABLE CHEST 1 VIEW COMPARISON:  04/16/2018 FINDINGS: The cardiac silhouette remains enlarged. Aortic atherosclerosis is noted. The lungs are mildly hypoinflated with mild central pulmonary vascular congestion. No overt edema, airspace consolidation, sizeable pleural effusion, or pneumothorax is identified. No acute osseous abnormality is  seen. IMPRESSION: Cardiomegaly and mild  hypoinflation with mild pulmonary vascular congestion. Electronically Signed   By: Sebastian Ache M.D.   On: 04/18/2018 13:28   Dg Chest Portable 1 View  Result Date: 04/16/2018 CLINICAL DATA:  Chest pain EXAM: PORTABLE CHEST 1 VIEW COMPARISON:  04/14/2018 FINDINGS: Stable cardiomegaly. Minimal aortic atherosclerosis without aneurysm. No acute pulmonary consolidation, effusion or pneumothorax. Minimal left basilar atelectasis. No acute nor suspicious osseous abnormalities. IMPRESSION: Stable cardiomegaly without active pulmonary disease. Electronically Signed   By: Tollie Eth M.D.   On: 04/16/2018 22:30   Dg Chest Portable 1 View  Result Date: 04/09/2018 CLINICAL DATA:  Initial evaluation for COPD. EXAM: PORTABLE CHEST 1 VIEW COMPARISON:  Prior radiograph from 03/23/2018 FINDINGS: Patient is markedly rotated to the right. Cardiomegaly grossly stable. Mediastinal silhouette within normal limits. Aortic atherosclerosis. Lungs hypoinflated. Underlying changes related to COPD. No focal infiltrates. No superimposed edema or pleural effusion. No pneumothorax. No acute osseous finding. IMPRESSION: 1. COPD.  No other superimposed active cardiopulmonary disease. 2. Stable mild cardiomegaly with aortic atherosclerosis. Electronically Signed   By: Rise Mu M.D.   On: 04/09/2018 04:33   Dg Knee Complete 4 Views Left  Result Date: 04/14/2018 CLINICAL DATA:  LEFT knee pain post fall EXAM: LEFT KNEE - COMPLETE 4+ VIEW COMPARISON:  04/09/2018 FINDINGS: Osseous mineralization appears diffusely decreased. Joint spaces preserved. No acute fracture, dislocation, or bone destruction. No knee joint effusion. IMPRESSION: No acute osseous abnormalities. Electronically Signed   By: Ulyses Southward M.D.   On: 04/14/2018 08:34   Dg Knee Left Port  Result Date: 04/09/2018 CLINICAL DATA:  58 year old female with left knee pain. EXAM: PORTABLE LEFT KNEE - 1-2 VIEW COMPARISON:  None.  FINDINGS: There is no acute fracture or dislocation. Mild osteopenia. No joint effusion. There is diffuse subcutaneous soft tissue edema. IMPRESSION: No acute fracture or dislocation. Electronically Signed   By: Elgie Collard M.D.   On: 04/09/2018 04:34   Dg Abdomen Acute W/chest  Result Date: 05/05/2018 CLINICAL DATA:  Vomiting, acute generalized abdominal pain. EXAM: DG ABDOMEN ACUTE W/ 1V CHEST COMPARISON:  CT scan of April 27, 2018. FINDINGS: There is no evidence of dilated bowel loops or free intraperitoneal air. No radiopaque calculi or other significant radiographic abnormality is seen. Mild cardiomegaly is noted. Both lungs are clear. IMPRESSION: No evidence of bowel obstruction or ileus. No acute cardiopulmonary disease. Electronically Signed   By: Lupita Raider, M.D.   On: 05/05/2018 08:48   Dg Hip Port Unilat W Or Wo Pelvis 1 View Left  Result Date: 04/09/2018 CLINICAL DATA:  58 year old female with left hip pain. EXAM: DG HIP (WITH OR WITHOUT PELVIS) 1V PORT LEFT COMPARISON:  None. FINDINGS: There is no acute fracture or dislocation. No significant arthritic changes. The bones are osteopenic. The soft tissues are grossly unremarkable. IMPRESSION: Negative. Electronically Signed   By: Elgie Collard M.D.   On: 04/09/2018 04:29   Vas [place] Lower Extremity Venous (dvt) (only [facility])  Result Date: 04/24/2018  Lower Venous Study Indications: Edema.  Performing Technologist: Toma Deiters RVS  Examination Guidelines: A complete evaluation includes B-mode imaging, spectral Doppler, color Doppler, and power Doppler as needed of all accessible portions of each vessel. Bilateral testing is considered an integral part of a complete examination. Limited examinations for reoccurring indications may be performed as noted.  Right Venous Findings: +---------+---------------+---------+-----------+----------+-------------------+          CompressibilityPhasicitySpontaneityPropertiesSummary              +---------+---------------+---------+-----------+----------+-------------------+  CFV      Full           Yes      Yes                                      +---------+---------------+---------+-----------+----------+-------------------+ SFJ      Full                                                             +---------+---------------+---------+-----------+----------+-------------------+ FV Prox  Full           Yes      Yes                                      +---------+---------------+---------+-----------+----------+-------------------+ FV Mid   Full                                                             +---------+---------------+---------+-----------+----------+-------------------+ FV DistalFull           Yes      Yes                                      +---------+---------------+---------+-----------+----------+-------------------+ PFV      Full           Yes      Yes                                      +---------+---------------+---------+-----------+----------+-------------------+ POP      Full           Yes      Yes                                      +---------+---------------+---------+-----------+----------+-------------------+ PTV      Full                                                             +---------+---------------+---------+-----------+----------+-------------------+ PERO                                                  Difficult to image  due to edema        +---------+---------------+---------+-----------+----------+-------------------+  Right Technical Findings: Diffiuclt to image the peroneal vein due to edema. Color flow and Doppler appear normal. Compression image difficult to visualize.    Summary: Right: There is no evidence of deep vein thrombosis in the lower extremity. No cystic structure found in the popliteal fossa. No change since exam of  03/17/2018 Left: There is no evidence of a common femoral vein obstruction  *See table(s) above for measurements and observations. Electronically signed by Lemar LivingsBrandon Cain MD on 04/24/2018 at 2:18:29 PM.    Final    Ct Maxillofacial Wo Contrast  Result Date: 05/03/2018 CLINICAL DATA:  Frequent falls.  Headache.  Head injury. EXAM: CT HEAD WITHOUT CONTRAST CT MAXILLOFACIAL WITHOUT CONTRAST CT CERVICAL SPINE WITHOUT CONTRAST TECHNIQUE: Multidetector CT imaging of the head, cervical spine, and maxillofacial structures were performed using the standard protocol without intravenous contrast. Multiplanar CT image reconstructions of the cervical spine and maxillofacial structures were also generated. COMPARISON:  CT head 04/24/2018 FINDINGS: CT HEAD FINDINGS Brain: Mild atrophy. Moderate chronic microvascular ischemic change in the white matter. No acute infarct, hemorrhage, or mass. Vascular: Negative for hyperdense vessel Skull: Negative for skull fracture Other: Right orbital floor fracture, see below CT MAXILLOFACIAL FINDINGS Osseous: Depressed fracture right orbital floor is unchanged and not acute. This appeared first on the CT face of 03/19/2018 approximately 9 mm defect in the orbital floor. No other facial fracture identified. Orbits: No orbital mass or edema. Right orbital floor fracture as above. Sinuses: Paranasal sinuses clear.  No air-fluid level. Soft tissues: No significant soft tissue contusion or mass. CT CERVICAL SPINE FINDINGS Alignment: Normal Skull base and vertebrae: Negative for fracture Soft tissues and spinal canal: Negative for mass or soft tissue contusion Disc levels:  Disc spaces maintained.  Mild facet degeneration. Upper chest: Negative Other: None IMPRESSION: 1. No acute intracranial abnormality. Atrophy and chronic microvascular ischemia. 2. Nonacute depressed fracture right orbital floor unchanged from prior studies. Paranasal sinuses clear. No acute fracture 3. Negative for cervical spine  fracture. Electronically Signed   By: Marlan Palauharles  Clark M.D.   On: 05/03/2018 15:33     Discharge Exam: Vitals:   05/07/18 0525 05/07/18 0743  BP: (!) 116/54   Pulse: 77   Resp: 18   Temp: 98 F (36.7 C)   SpO2: 100% 94%   Vitals:   05/06/18 1200 05/06/18 1911 05/07/18 0525 05/07/18 0743  BP: 117/60  (!) 116/54   Pulse: 79  77   Resp: (!) 22  18   Temp: 98.4 F (36.9 C)  98 F (36.7 C)   TempSrc: Oral  Oral   SpO2: 99% 96% 100% 94%  Weight:      Height:          The results of significant diagnostics from this hospitalization (including imaging, microbiology, ancillary and laboratory) are listed below for reference.     Microbiology: Recent Results (from the past 240 hour(s))  MRSA PCR Screening     Status: None   Collection Time: 05/05/18 11:09 AM  Result Value Ref Range Status   MRSA by PCR NEGATIVE NEGATIVE Final    Comment:        The GeneXpert MRSA Assay (FDA approved for NASAL specimens only), is one component of a comprehensive MRSA colonization surveillance program. It is not intended to diagnose MRSA infection nor to guide or monitor treatment for MRSA infections. Performed at [facility], [address]  [place], [place] [address]      Labs: BNP (last 3 results) Recent Labs    04/18/18 1249 04/19/18 0813 04/24/18 0439  BNP 313.9* 231.2* 461.5*   Basic Metabolic Panel: Recent Labs  Lab 05/02/18 1548 05/03/18 1436 05/05/18 0703 05/05/18 1539 05/06/18 0231  NA 138 136 139 136 136  K 2.7* 3.6 <2.0* 2.3* 3.3*  CL 94* 95* 86* 87* 92*  CO2 31 30 39* 38* 36*  GLUCOSE 81 109* 108* 99 82  BUN 5* <5* <5* <5* <5*  CREATININE 0.42* 0.34* 0.44 0.41* 0.42*  CALCIUM 7.6* 7.6* 7.1* 6.7* 7.2*  MG  --   --  1.2* 1.7  --    Liver Function Tests: Recent Labs  Lab 05/05/18 0703 05/06/18 0231  AST 47* 35  ALT 25 20  ALKPHOS 105 94  BILITOT 1.4* 2.0*  PROT 6.7 5.7*  ALBUMIN 3.3* 2.8*   Recent Labs  Lab 05/05/18 0703   LIPASE 24   No results for input(s): AMMONIA in the last 168 hours. CBC: Recent Labs  Lab 05/02/18 1548 05/03/18 1436 05/05/18 0703 05/06/18 0231  WBC 3.7* 5.8 3.9* 3.5*  NEUTROABS 2.3 3.9 2.1  --   HGB 8.9* 9.0* 9.7* 8.5*  HCT 31.8* 32.6* 34.1* 31.3*  MCV 83.5 82.7 80.8 83.2  PLT 173 194 175 130*   Cardiac Enzymes: No results for input(s): CKTOTAL, CKMB, CKMBINDEX, TROPONINI in the last 168 hours. BNP: Invalid input(s): POCBNP CBG: No results for input(s): GLUCAP in the last 168 hours. D-Dimer No results for input(s): DDIMER in the last 72 hours. Hgb A1c No results for input(s): HGBA1C in the last 72 hours. Lipid Profile No results for input(s): CHOL, HDL, LDLCALC, TRIG, CHOLHDL, LDLDIRECT in the last 72 hours. Thyroid function studies No results for input(s): TSH, T4TOTAL, T3FREE, THYROIDAB in the last 72 hours.  Invalid input(s): FREET3 Anemia work up No results for input(s): VITAMINB12, FOLATE, FERRITIN, TIBC, IRON, RETICCTPCT in the last 72 hours. Urinalysis    Component Value Date/Time   COLORURINE STRAW (A) 04/27/2018 0404   APPEARANCEUR CLEAR 04/27/2018 0404   LABSPEC 1.006 04/27/2018 0404   PHURINE 7.0 04/27/2018 0404   GLUCOSEU NEGATIVE 04/27/2018 0404   HGBUR MODERATE (A) 04/27/2018 0404   BILIRUBINUR NEGATIVE 04/27/2018 0404   BILIRUBINUR moderate (A) 06/19/2015 1906   KETONESUR NEGATIVE 04/27/2018 0404   PROTEINUR NEGATIVE 04/27/2018 0404   UROBILINOGEN >=8.0 06/19/2015 1906   UROBILINOGEN 0.2 09/14/2011 2141   NITRITE NEGATIVE 04/27/2018 0404   LEUKOCYTESUR LARGE (A) 04/27/2018 0404   Sepsis Labs Invalid input(s): PROCALCITONIN,  WBC,  LACTICIDVEN Microbiology Recent Results (from the past 240 hour(s))  MRSA PCR Screening     Status: None   Collection Time: 05/05/18 11:09 AM  Result Value Ref Range Status   MRSA by PCR NEGATIVE NEGATIVE Final    Comment:        The GeneXpert MRSA Assay (FDA approved for NASAL specimens only), is one  component of a comprehensive MRSA colonization surveillance program. It is not intended to diagnose MRSA infection nor to guide or monitor treatment for MRSA infections. Performed at [facility], [address], [place], [place] [address]        SIGNED:   Kathlen Mody, MD  [facility] 05/07/2018, 5:08 PM Pager   If 7PM-7AM, please contact night-coverage [email or web address] Password [email or web address]

## 2018-05-07 NOTE — Progress Notes (Signed)
Zollie Scale (daughter) and Roanna Epley (daughter) called back to hospital this afternoon asking to speak with the nurse who took care of her mother today.   Zollie Scale states that her mother, (patient above) is calling asking to be taken back to the hospital.   Zollie Scale asked if the patient was discharged, or if the patient left on her own.  RN confirmed that patient left AMA this morning around 1130 am.

## 2018-05-07 NOTE — ED Triage Notes (Signed)
Pt arrives EMS from home c/o rectal pain. Pt refuses to answer when asked about her symptoms, responding "You figure it out, I'm too tired". Pt refuses to have VS checked, and also urinated on herself while RN was still in the room.
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
## 2018-05-07 NOTE — ED Notes (Signed)
Bed: WA06 Expected date:  Expected time:  Means of arrival:  Comments: 58 yo hemorrhoid

## 2018-05-07 NOTE — ED Notes (Addendum)
Patient covered in stool and urine; provided patient with clean gown and supplies to perform personal hygiene. Patient refused to clean self up, stating "this is your job, you get paid to clean me." Assisted patient with cleaning off legs and perineum, but patient refused to allow me to change the linen which is also soiled. Provided education on the importance of changing the linen but patient persistent with refusal, stating "I don't care, I just want a blanket." When attempting to complete my assessment, patient only complaining of generalized pain and states that she believes she has the flu.

## 2018-05-09 ENCOUNTER — Emergency Department (HOSPITAL_COMMUNITY): Payer: Self-pay

## 2018-05-09 ENCOUNTER — Emergency Department (HOSPITAL_COMMUNITY)
Admission: EM | Admit: 2018-05-09 | Discharge: 2018-05-09 | Disposition: A | Discharge status: Home / Self Care | Payer: Self-pay | Attending: Emergency Medicine | Admitting: Emergency Medicine

## 2018-05-09 ENCOUNTER — Encounter (HOSPITAL_COMMUNITY): Payer: Self-pay

## 2018-05-09 DIAGNOSIS — R51 Headache: Secondary | ICD-10-CM | POA: Insufficient documentation

## 2018-05-09 DIAGNOSIS — F1010 Alcohol abuse, uncomplicated: Secondary | ICD-10-CM | POA: Insufficient documentation

## 2018-05-09 DIAGNOSIS — Z79899 Other long term (current) drug therapy: Secondary | ICD-10-CM | POA: Insufficient documentation

## 2018-05-09 DIAGNOSIS — F17210 Nicotine dependence, cigarettes, uncomplicated: Secondary | ICD-10-CM | POA: Insufficient documentation

## 2018-05-09 DIAGNOSIS — R519 Headache, unspecified: Secondary | ICD-10-CM

## 2018-05-09 DIAGNOSIS — G35 Multiple sclerosis: Secondary | ICD-10-CM | POA: Insufficient documentation

## 2018-05-09 DIAGNOSIS — J449 Chronic obstructive pulmonary disease, unspecified: Secondary | ICD-10-CM | POA: Insufficient documentation

## 2018-05-09 DIAGNOSIS — R110 Nausea: Secondary | ICD-10-CM

## 2018-05-09 LAB — COMPREHENSIVE METABOLIC PANEL
ALT: 18 U/L (ref 0–44)
ANION GAP: 5 (ref 5–15)
AST: 39 U/L (ref 15–41)
Albumin: 3 g/dL — ABNORMAL LOW (ref 3.5–5.0)
Alkaline Phosphatase: 103 U/L (ref 38–126)
BUN: <5 mg/dL — ABNORMAL LOW (ref 6–20)
CO2: 28 mmol/L (ref 22–32)
Calcium: 8 mg/dL — ABNORMAL LOW (ref 8.9–10.3)
Chloride: 104 mmol/L (ref 98–111)
Creatinine, Ser: 0.34 mg/dL — ABNORMAL LOW (ref 0.44–1.00)
GFR calc non Af Amer: >60 mL/min (ref >60)
Glucose, Bld: 98 mg/dL (ref 70–99)
Potassium: 4.2 mmol/L (ref 3.5–5.1)
Sodium: 137 mmol/L (ref 135–145)
Total Bilirubin: 1.2 mg/dL (ref 0.3–1.2)
Total Protein: 6.2 g/dL — ABNORMAL LOW (ref 6.5–8.1)

## 2018-05-09 LAB — CBC
HCT: 34.2 % — ABNORMAL LOW (ref 36.0–46.0)
Hemoglobin: 9.3 g/dL — ABNORMAL LOW (ref 12.0–15.0)
MCH: 22.8 pg — ABNORMAL LOW (ref 26.0–34.0)
MCHC: 27.2 g/dL — ABNORMAL LOW (ref 30.0–36.0)
MCV: 83.8 fL (ref 80.0–100.0)
NRBC: 0 % (ref 0.0–0.2)
Platelets: 131 10*3/uL — ABNORMAL LOW (ref 150–400)
RBC: 4.08 MIL/uL (ref 3.87–5.11)
RDW: 21.7 % — ABNORMAL HIGH (ref 11.5–15.5)
WBC: 5.4 10*3/uL (ref 4.0–10.5)

## 2018-05-09 LAB — MAGNESIUM: Magnesium: 1.9 mg/dL (ref 1.7–2.4)

## 2018-05-09 LAB — ETHANOL: ALCOHOL ETHYL (B): 59 mg/dL — AB (ref <10)

## 2018-05-09 LAB — LIPASE, BLOOD: LIPASE: 25 U/L (ref 11–51)

## 2018-05-09 MED ORDER — DIPHENHYDRAMINE HCL 50 MG/ML IJ SOLN
12.5000 mg | Freq: Once | INTRAMUSCULAR | Status: AC
  Administered 2018-05-09: 12.5 mg via INTRAVENOUS
  Filled 2018-05-09: qty 1

## 2018-05-09 MED ORDER — SODIUM CHLORIDE 0.9 % IV BOLUS
1000.0000 mL | Freq: Once | INTRAVENOUS | Status: AC
  Administered 2018-05-09: 1000 mL via INTRAVENOUS

## 2018-05-09 MED ORDER — ONDANSETRON HCL 4 MG/2ML IJ SOLN
4.0000 mg | Freq: Once | INTRAMUSCULAR | Status: AC
  Administered 2018-05-09: 4 mg via INTRAVENOUS
  Filled 2018-05-09: qty 2

## 2018-05-09 MED ORDER — METOCLOPRAMIDE HCL 5 MG/ML IJ SOLN
10.0000 mg | Freq: Once | INTRAMUSCULAR | Status: AC
  Administered 2018-05-09: 10 mg via INTRAVENOUS
  Filled 2018-05-09: qty 2

## 2018-05-09 MED ORDER — CHLORDIAZEPOXIDE HCL 25 MG PO CAPS: ORAL_CAPSULE | ORAL | 0 refills | Status: DC

## 2018-05-09 NOTE — ED Notes (Signed)
[facility] called for transportation  

## 2018-05-09 NOTE — ED Triage Notes (Signed)
Pt arrives via ems with complaints of nausea and a headache that started about an hour ago. Per ems patient drank a bottle of vodka, patient refusing.

## 2018-05-09 NOTE — ED Provider Notes (Signed)
[facility]-EMERGENCY DEPT Provider Note   CSN: [id number] Arrival date & time: 05/09/18  0551    History   Chief Complaint Chief Complaint  Patient presents with  . Nausea  . Alcohol Intoxication    HPI Anne Black is a 58 y.o. female with a hx of chronic EtOH abuse, cirrhosis, bipolar disorder, anxiety, COPD w/ chronic hypoxemia on 2L via [place], [place], prior afib w/ RVR, and migraines who presents to the ED with chief complaint of persistent nausea. Patient states she is continuously nauseous. She notes 1 episode of associated emesis within the past 24 hours. She also is complaining of migraine that had gradual onset steady progression similar to prior migraines. No recent fall or head injury. Denies change in vision, numbness, weakness, dizziness, fever, or chills.     HPI  Past Medical History:  Diagnosis Date  . ADHD (attention deficit hyperactivity disorder)   . Alcoholic cirrhosis (HCC)    with ascites  . Alcoholism (HCC)   . Allergies   . Anxiety   . Arthritis   . Atrial fibrillation with RVR (HCC)   . Bipolar disorder (HCC)   . COPD (chronic obstructive pulmonary disease) (HCC)    wears 2L chronic O2  . Dyspnea   . GERD (gastroesophageal reflux disease)   . History of hiatal hernia   . Hypokalemia   . Migraine   . Multiple sclerosis (HCC)   . Narcolepsy   . Osteoarthritis of knee   . Osteoporosis   . Pneumonia     Patient Active Problem List   Diagnosis Date Noted  . Alcohol dependence (HCC) 05/06/2018  . Electrolyte disturbance 05/05/2018  . Alcohol withdrawal delirium (HCC) 04/28/2018  . Weakness generalized 04/27/2018  . Gait abnormality 04/24/2018  . Chronic hypoxemic respiratory failure (HCC) 04/11/2018  . Atrial fibrillation with rapid ventricular response (HCC) 03/23/2018  . Head injury   . Fall 03/19/2018  . Syncope 03/19/2018  . Orbital fracture 03/19/2018  . Scalp laceration 03/19/2018  . Acute respiratory failure with  hypoxia and hypercarbia (HCC) 03/19/2018  . Abdominal tenderness 03/19/2018  . Acute on chronic respiratory failure with hypoxia (HCC) 03/17/2018  . Alcohol abuse with intoxication (HCC) 03/17/2018  . ETOH abuse   . Normocytic anemia 03/16/2018  . Acute metabolic encephalopathy 03/16/2018  . Lower leg edema 03/16/2018  . Migraine 03/16/2018  . Community acquired pneumonia of left lower lobe of lung (HCC)   . Weakness 08/21/2017  . COPD exacerbation (HCC) 06/12/2017  . Chest pain with moderate risk for cardiac etiology 05/30/2017  . Abnormal LFTs 05/30/2017  . Tachycardia 05/30/2017  . COPD (chronic obstructive pulmonary disease) (HCC) 04/19/2017  . Right rib fracture 04/15/2017  . Abdominal wall cellulitis 12/30/2016  . S/P exploratory laparotomy 12/30/2016  . Distal radius fracture, left 09/09/2016  . Thrombocytopenia (HCC) 09/09/2016  . Chest x-ray abnormality   . Umbilical hernia without obstruction and without gangrene 05/12/2016  . Itching 02/24/2016  . Ventral hernia without obstruction or gangrene 02/24/2016  . Palliative care encounter   . Ascites   . Tachypnea   . Atrial flutter with rapid ventricular response (HCC)   . Hypomagnesemia   . Hypoxia   . Dehydration 01/22/2016  . Low back ache 12/30/2015  . Non-intractable vomiting with nausea   . Alcohol abuse with alcohol-induced mood disorder (HCC) 12/15/2015  . Acute alcoholism (HCC)   . Abdominal distension 11/04/2015  . Alcohol abuse   . Dyspnea   . Acute  respiratory failure (HCC) 10/15/2015  . Ascites due to alcoholic cirrhosis (HCC) 10/15/2015  . Osteoporosis   . Multiple sclerosis (HCC)   . Alcoholic cirrhosis of liver with ascites (HCC) 08/22/2015  . Bipolar disorder, current episode mixed, moderate (HCC) 08/01/2015  . Alcohol use disorder, severe, dependence (HCC) 07/31/2015  . Macrocytic anemia- due to alcohol abuse with normal B12 & folate levels 05/31/2015  . Severe protein-calorie malnutrition (HCC)  05/31/2015  . Hypokalemia 05/26/2015  . Encephalopathy, hepatic (HCC) 05/26/2015  . Alcohol withdrawal (HCC) 05/18/2015  . Abdominal pain 05/18/2015  . Anxiety 05/18/2015  . Stimulant abuse (HCC) 12/27/2014  . Nicotine abuse 12/27/2014  . Hyperprolactinemia (HCC) 11/15/2014  . Dyslipidemia   . Alcohol use disorder, moderate, dependence (HCC) 10/30/2014  . Tobacco abuse 01/23/2014  . Bipolar 1 disorder (HCC) 04/07/2013  . Noncompliance with therapeutic plan 04/04/2013  . Hereditary and idiopathic peripheral neuropathy 05/01/2012  . Depression 05/01/2012  . GERD (gastroesophageal reflux disease) 05/01/2012  . Osteoarthrosis, unspecified whether generalized or localized, involving lower leg 05/01/2012  . Hyponatremia 07/01/2011    Past Surgical History:  Procedure Laterality Date  . ABDOMINAL WALL DEFECT REPAIR N/A 12/30/2016   Procedure: EXPLORATORY LAPAROTOMY WITH REPAIR ABDOMINAL WALL VENTRAL HERNIA;  Surgeon: [name], MD;  Location[facility] OR;  Service: General;  Laterality: N/A;  . APPLICATION OF WOUND VAC N/A 12/30/2016   Procedure: APPLICATION OF WOUND VAC;  Surgeon: [name], MD;  Location: [facility] OR;  Service: General;  Laterality: N/A;  . CESAREAN SECTION  [phone]  . FRACTURE SURGERY    . HERNIA REPAIR    . MYRINGOTOMY WITH TUBE PLACEMENT Bilateral   . ORIF WRIST FRACTURE Left 09/09/2016   Procedure: OPEN REDUCTION INTERNAL FIXATION (ORIF) LEFT WRIST FRACTURE, LEFT CARPAL TUNNEL RELEASE;  Surgeon: Dominica Severin, MD;  Location: [facility] ORS;  Service: Orthopedics;  Laterality: Left;  . TONSILLECTOMY       OB History   No obstetric history on file.      Home Medications    Prior to Admission medications   Medication Sig Start Date End Date Taking? Authorizing Provider  acetaminophen (TYLENOL) 500 MG tablet Take 1 tablet (500 mg total) by mouth every 6 (six) hours as needed for mild pain. Patient taking differently: Take 1,000 mg by mouth every 6 (six)  hours as needed for mild pain.  03/28/18   Rolly Salter, MD  albuterol (PROVENTIL HFA;VENTOLIN HFA) 108 (90 Base) MCG/ACT inhaler Inhale 2 puffs into the lungs every 4 (four) hours as needed for wheezing or shortness of breath.    [provider]  aluminum-magnesium hydroxide-simethicone (MAALOX) 200-200-20 MG/5ML SUSP Take 30 mLs by mouth 4 (four) times daily as needed (indigestion).     [provider]  chlordiazePOXIDE (LIBRIUM) 25 MG capsule  PO TID x 1D, then 25-50mg  PO BID X 1D, then 25-50mg  PO QD X 1D 05/03/18   Henderly, Britni A, PA-C  diltiazem (CARDIZEM CD) 120 MG 24 hr capsule Take 1 capsule (120 mg total) by mouth daily for 30 days. 04/14/18 05/14/18  Tegeler, Canary Brim, MD  Fluticasone-Salmeterol [facility] INHUB) 250-50 MCG/DOSE AEPB Inhale 2 puffs into the lungs 2 (two) times daily.     [provider]  furosemide (LASIX) 40 MG tablet Take 40 mg twice daily till 04/02/2018, after that take 40 mg daily. Patient taking differently: Take 40 mg by mouth daily. Take 40 mg twice daily till 04/02/2018, after that take 40 mg daily. 04/14/18   Tegeler, Cristal Deer  J, MD  gabapentin (NEURONTIN) 100 MG capsule Take 2 capsules (200 mg total) by mouth 3 (three) times daily for 30 days. 05/03/18 06/02/18  Henderly, Britni A, PA-C  hydrOXYzine (ATARAX/VISTARIL) 25 MG tablet Take 1 tablet (25 mg total) by mouth daily as needed for up to 30 doses (anxiety). Patient taking differently: Take 25 mg by mouth daily as needed for anxiety (withdrawal).  04/14/18   Tegeler, Canary Brim, MD  ipratropium-albuterol (DUONEB) 0.5-2.5 (3) MG/3ML SOLN Take 3 mLs by nebulization 2 (two) times daily. AND every 4 hours as needed for wheezing/shortness of breath    [provider]  lactulose (CHRONULAC) 10 GM/15ML solution Take 45 mLs (30 g total) by mouth 3 (three) times daily for 30 days. 04/14/18 05/14/18  Tegeler, Canary Brim, MD  lidocaine (LIDODERM) 5 % Place 1 patch onto the skin  daily. Remove & Discard patch within 12 hours or as directed by MD 05/03/18   Henderly, Britni A, PA-C  magnesium oxide (MAG-OX) 400 MG tablet Take 400 mg by mouth daily.    [provider]  pantoprazole (PROTONIX) 40 MG tablet Take 1 tablet (40 mg total) by mouth daily for 30 days. 04/14/18 05/14/18  Tegeler, Canary Brim, MD  potassium chloride SA (K-DUR,KLOR-CON) 20 MEQ tablet Take 2 tablets (40 mEq total) by mouth 2 (two) times daily. 04/19/18   Bethel Born, PA-C  rizatriptan (MAXALT) 10 MG tablet Take 10 mg by mouth as needed for migraine. May repeat in 2 hours if needed    [provider]  rizatriptan (MAXALT) 5 MG tablet Take 5 mg by mouth as needed for migraine. May repeat in 2 hours if needed; not to exceed 30mg  in 24 hours    [provider]  spironolactone (ALDACTONE) 25 MG tablet Take 1 tablet (25 mg total) by mouth daily for 30 days. 04/14/18 05/14/18  Tegeler, Canary Brim, MD    Family History Family History  Problem Relation Age of Onset  . Arrhythmia Mother   . Neuropathy Mother   . Coronary artery disease Mother   . Heart disease Mother   . Heart disease Father   . Hypertension Father   . Heart attack Father   . Multiple sclerosis Maternal Aunt     Social History Social History   Tobacco Use  . Smoking status: Current Every Day Smoker    Packs/day: 1.00    Years: 36.00    Pack years: 36.00    Types: Cigarettes  . Smokeless tobacco: Never Used  Substance Use Topics  . Alcohol use: Yes    Comment: 1 bottle of wine per day per patient.   . Drug use: No     Allergies   Patient has no known allergies.   Review of Systems Review of Systems  Constitutional: Negative for chills and fever.  Eyes: Negative for visual disturbance.  Respiratory: Negative for shortness of breath.   Cardiovascular: Negative for chest pain.  Gastrointestinal: Positive for nausea and vomiting. Negative for abdominal pain and blood in stool.  Genitourinary:  Negative for dysuria.  Neurological: Positive for headaches. Negative for dizziness, seizures, syncope, facial asymmetry, speech difficulty, weakness and numbness.  All other systems reviewed and are negative.   Physical Exam Updated Vital Signs BP 119/65 (BP Location: Right Arm)   Pulse 84   Temp 98.7 F (37.1 C) (Oral)   Resp 15   SpO2 100%   Physical Exam Vitals signs and nursing note reviewed.  Constitutional:  General: She is not in acute distress.    Appearance: She is well-developed. She is not toxic-appearing.  HENT:     Head: Normocephalic and atraumatic.  Eyes:     General:        Right eye: No discharge.        Left eye: No discharge.     Conjunctiva/sclera: Conjunctivae normal.  Neck:     Musculoskeletal: Neck supple.  Cardiovascular:     Rate and Rhythm: Normal rate and regular rhythm.  Pulmonary:     Effort: Pulmonary effort is normal. No respiratory distress.     Breath sounds: Decreased breath sounds (poor air movement ) present. No wheezing, rhonchi or rales.  Abdominal:     General: There is no distension.     Palpations: Abdomen is soft.     Tenderness: There is no abdominal tenderness. There is no guarding or rebound.  Skin:    General: Skin is warm and dry.     Findings: No rash.  Neurological:     Mental Status: She is alert.     Comments: Clear speech. CN III-XII Grossly intact. Sensation grossly intact x 4. 5/5 symmetric grip strength. 5/5 strength with plantar/dorsiflexion bilaterally. Negative pronator drift.   Psychiatric:        Behavior: Behavior normal.    ED Treatments / Results  Labs (all labs ordered are listed, but only abnormal results are displayed) Labs Reviewed  COMPREHENSIVE METABOLIC PANEL - Abnormal; Notable for the following components:      Result Value   BUN <5 (*)    Creatinine, Ser 0.34 (*)    Calcium 8.0 (*)    Total Protein 6.2 (*)    Albumin 3.0 (*)    All other components within normal limits  CBC -  Abnormal; Notable for the following components:   Hemoglobin 9.3 (*)    HCT 34.2 (*)    MCH 22.8 (*)    MCHC 27.2 (*)    RDW 21.7 (*)    Platelets 131 (*)    All other components within normal limits  ETHANOL - Abnormal; Notable for the following components:   Alcohol, Ethyl (B) 59 (*)    All other components within normal limits  MAGNESIUM  LIPASE, BLOOD    EKG EKG Interpretation  Date/Time:  Tuesday May 09 2018 06:26:34 EDT Ventricular Rate:  86 PR Interval:    QRS Duration: 92 QT Interval:  370 QTC Calculation: 443 R Axis:   84 Text Interpretation:  Sinus rhythm Normal ECG Confirmed by Molpus, John ([id number]) on 05/09/2018 6:33:38 AM   Radiology Dg Chest 2 View  Result Date: 05/09/2018 CLINICAL DATA:  Nausea, vomiting EXAM: CHEST - 2 VIEW COMPARISON:  05/05/2018 FINDINGS: Borderline heart size. No confluent airspace opacities or effusions. No acute bony abnormality. IMPRESSION: No active cardiopulmonary disease. Electronically Signed   By: Charlett Nose M.D.   On: 05/09/2018 09:09   Ct Head Wo Contrast  Result Date: 05/09/2018 CLINICAL DATA:  58 year old female with nausea and headache after drinking a bottle of vodka. EXAM: CT HEAD WITHOUT CONTRAST TECHNIQUE: Contiguous axial images were obtained from the base of the skull through the vertex without intravenous contrast. COMPARISON:  Recent prior head CT 05/03/2018 FINDINGS: Brain: No evidence of acute infarction, hemorrhage, hydrocephalus, extra-axial collection or mass lesion/mass effect. Similar appearance of age advanced cortical volume loss and periventricular, subcortical and deep white matter hypoattenuation consistent with chronic microvascular ischemic white matter disease. Vascular: No hyperdense vessel or  unexpected calcification. Skull: Normal. Negative for fracture or focal lesion. Sinuses/Orbits: No acute finding. Stable depressed right orbital floor fracture with associated mucous retention cyst. Other: None.  IMPRESSION: 1. No acute intracranial abnormality. 2. Age advanced cerebral cortical volume loss. 3. Moderate chronic microvascular ischemic white matter disease. Electronically Signed   By: Malachy Moan M.D.   On: 05/09/2018 09:10    Procedures Procedures (including critical care time)  Medications Ordered in ED Medications - No data to display   Initial Impression / Assessment and Plan / ED Course  I have reviewed the triage vital signs and the nursing notes.  Pertinent labs & imaging results that were available during my care of the patient were reviewed by me and considered in my medical decision making (see chart for details).   Patient with extensive PmHx as listed above presents w/ nausea, 1 episode of emesis, & migraine (gradual onset, steady progression, hx of same). Nontoxic appearing, vitals WNL. Exam with no focal neuro deficits or abdominal tenderness/peritoneal signs. Plan for laboratory evaluation, CT head w/ headache & EtOH abuse, & CXR with her poor air movement.   Work-up reviewed:  CBC: Baseline anemia & thrombocytopenia. No leukocytosis.  CMP: Mild hypocalcemia @ 8.0. Electrolytes otherwise WNL. LFTs WNL.  Mg: WNL.  Lipase: WNL Ethanol: elevated @ 59.  CT head: Negative for acute process.  CXR: Negative EKG: NSR. QTc 443.   Labs are actually improved from prior. EtOH elevated- chronic EtOH abuse. CXR negative, no adventitious sounds on repeat lung exam, SpO2> 95% on her baseline 2L. Non surgical abdomen- doubt cholecystitis, pancreatitis, appendicitis, perf/obstruction. Headache without red flags & negative head CT. Patient feeling improved following migraine cocktail, tolerating PO without difficulty. She expressed wanting to detox from EtOH, no signs of DTs currently, no SI/HI, will provide librium taper & outpatient resources, consult to peer support placed. She is ambulatory in the ER without acute distress and appears appropriate for discharge. I discussed  results, treatment plan, need for follow-up, and return precautions with the patient. . I also discussed w/ her ex husband via telephone at her request. Provided opportunity for questions, patient confirmed understanding and is in agreement with plan.  Findings and plan of care discussed with supervising physician Dr. Madilyn Hook who is in agreement.  Vitals:   05/09/18 1006 05/09/18 1030  BP: 123/65 124/64  Pulse: 85 84  Resp: 18 18  Temp:    SpO2: 100% 96%     Final Clinical Impressions(s) / ED Diagnoses   Final diagnoses:  Acute nonintractable headache, unspecified headache type  Nausea  Alcohol abuse    ED Discharge Orders         Ordered    chlordiazePOXIDE (LIBRIUM) 25 MG capsule     05/09/18 [address] [place], PA-C 05/09/18 1144    Tilden Fossa, MD 05/09/18 1554

## 2018-05-09 NOTE — ED Notes (Signed)
Bed: ZO10 Expected date:  Expected time:  Means of arrival:  Comments: EMS 58 yo Nausea

## 2018-05-09 NOTE — Discharge Instructions (Addendum)
You are seen in the emergency department today for nausea and headache.  Your labs were reassuring.  Your CT scan of your head did not show acute abnormalities.  Your chest x-ray was normal.  Please continue to maintain good hydration at home.  We are sending you home with a Librium taper to help to detox off of alcohol.  Please take this as prescribed.  We have given you outpatient resources for different facilities to assist with detox. WE have called to ensure the pharmacy your medicine was sent to is in open.   We have prescribed you new medication(s) today. Discuss the medications prescribed today with your pharmacist as they can have adverse effects and interactions with your other medicines including over the counter and prescribed medications. Seek medical evaluation if you start to experience new or abnormal symptoms after taking one of these medicines, seek care immediately if you start to experience difficulty breathing, feeling of your throat closing, facial swelling, or rash as these could be indications of a more serious allergic reaction   Please follow-up with these resources as well as your primary care provider within the next few days.  Return to the ER for new or worsening symptoms including but not limited to inability to keep fluids down, worsening pain, fever, chest pain, shortness of breath, passing out, seizure activity, tremors, hallucinations, or any other concerns.

## 2018-05-09 NOTE — ED Notes (Signed)
WATER GIVEN 

## 2018-05-09 NOTE — ED Notes (Signed)
Patient continues to c/o nausea. Provider made aware.

## 2018-05-12 ENCOUNTER — Other Ambulatory Visit: Payer: Self-pay

## 2018-05-12 ENCOUNTER — Encounter (HOSPITAL_COMMUNITY): Payer: Self-pay

## 2018-05-12 ENCOUNTER — Emergency Department (HOSPITAL_COMMUNITY)
Admission: EM | Admit: 2018-05-12 | Discharge: 2018-05-12 | Disposition: A | Discharge status: Home / Self Care | Payer: Self-pay | Attending: Emergency Medicine | Admitting: Emergency Medicine

## 2018-05-12 ENCOUNTER — Emergency Department (HOSPITAL_COMMUNITY): Payer: Self-pay

## 2018-05-12 DIAGNOSIS — W19XXXA Unspecified fall, initial encounter: Secondary | ICD-10-CM

## 2018-05-12 DIAGNOSIS — Y939 Activity, unspecified: Secondary | ICD-10-CM | POA: Insufficient documentation

## 2018-05-12 DIAGNOSIS — S0012XA Contusion of left eyelid and periocular area, initial encounter: Secondary | ICD-10-CM | POA: Insufficient documentation

## 2018-05-12 DIAGNOSIS — Y929 Unspecified place or not applicable: Secondary | ICD-10-CM | POA: Insufficient documentation

## 2018-05-12 DIAGNOSIS — W1830XA Fall on same level, unspecified, initial encounter: Secondary | ICD-10-CM | POA: Insufficient documentation

## 2018-05-12 DIAGNOSIS — J449 Chronic obstructive pulmonary disease, unspecified: Secondary | ICD-10-CM | POA: Insufficient documentation

## 2018-05-12 DIAGNOSIS — F17210 Nicotine dependence, cigarettes, uncomplicated: Secondary | ICD-10-CM | POA: Insufficient documentation

## 2018-05-12 DIAGNOSIS — Z79899 Other long term (current) drug therapy: Secondary | ICD-10-CM | POA: Insufficient documentation

## 2018-05-12 DIAGNOSIS — F1010 Alcohol abuse, uncomplicated: Secondary | ICD-10-CM | POA: Insufficient documentation

## 2018-05-12 DIAGNOSIS — Y999 Unspecified external cause status: Secondary | ICD-10-CM | POA: Insufficient documentation

## 2018-05-12 DIAGNOSIS — Y92009 Unspecified place in unspecified non-institutional (private) residence as the place of occurrence of the external cause: Secondary | ICD-10-CM

## 2018-05-12 MED ORDER — ONDANSETRON 8 MG PO TBDP
8.0000 mg | ORAL_TABLET | Freq: Once | ORAL | Status: AC
  Administered 2018-05-12: 8 mg via ORAL
  Filled 2018-05-12: qty 1

## 2018-05-12 MED ORDER — ACETAMINOPHEN 500 MG PO TABS
1000.0000 mg | ORAL_TABLET | Freq: Once | ORAL | Status: AC
  Administered 2018-05-12: 16:00:00 1000 mg via ORAL
  Filled 2018-05-12: qty 2

## 2018-05-12 NOTE — ED Provider Notes (Signed)
[facility]-EMERGENCY DEPT Provider Note   CSN: [id number] Arrival date & time: 05/12/18  0749    History   Chief Complaint Chief Complaint  Patient presents with  . Fall  . Alcohol Intoxication    HPI Anne Black is a 58 y.o. female.     HPI Patient presents to the emergency department with injuries following a fall.  The patient is a frequent visitor to the emergency department.  She falls very frequently due to her alcoholism.  The patient states that she is unsure of what time she fell but sometime in the night.  Past Medical History:  Diagnosis Date  . ADHD (attention deficit hyperactivity disorder)   . Alcoholic cirrhosis (HCC)    with ascites  . Alcoholism (HCC)   . Allergies   . Anxiety   . Arthritis   . Atrial fibrillation with RVR (HCC)   . Bipolar disorder (HCC)   . COPD (chronic obstructive pulmonary disease) (HCC)    wears 2L chronic O2  . Dyspnea   . GERD (gastroesophageal reflux disease)   . History of hiatal hernia   . Hypokalemia   . Migraine   . Multiple sclerosis (HCC)   . Narcolepsy   . Osteoarthritis of knee   . Osteoporosis   . Pneumonia     Patient Active Problem List   Diagnosis Date Noted  . Alcohol dependence (HCC) 05/06/2018  . Electrolyte disturbance 05/05/2018  . Alcohol withdrawal delirium (HCC) 04/28/2018  . Weakness generalized 04/27/2018  . Gait abnormality 04/24/2018  . Chronic hypoxemic respiratory failure (HCC) 04/11/2018  . Atrial fibrillation with rapid ventricular response (HCC) 03/23/2018  . Head injury   . Fall 03/19/2018  . Syncope 03/19/2018  . Orbital fracture 03/19/2018  . Scalp laceration 03/19/2018  . Acute respiratory failure with hypoxia and hypercarbia (HCC) 03/19/2018  . Abdominal tenderness 03/19/2018  . Acute on chronic respiratory failure with hypoxia (HCC) 03/17/2018  . Alcohol abuse with intoxication (HCC) 03/17/2018  . ETOH abuse   . Normocytic anemia 03/16/2018  . Acute  metabolic encephalopathy 03/16/2018  . Lower leg edema 03/16/2018  . Migraine 03/16/2018  . Community acquired pneumonia of left lower lobe of lung (HCC)   . Weakness 08/21/2017  . COPD exacerbation (HCC) 06/12/2017  . Chest pain with moderate risk for cardiac etiology 05/30/2017  . Abnormal LFTs 05/30/2017  . Tachycardia 05/30/2017  . COPD (chronic obstructive pulmonary disease) (HCC) 04/19/2017  . Right rib fracture 04/15/2017  . Abdominal wall cellulitis 12/30/2016  . S/P exploratory laparotomy 12/30/2016  . Distal radius fracture, left 09/09/2016  . Thrombocytopenia (HCC) 09/09/2016  . Chest x-ray abnormality   . Umbilical hernia without obstruction and without gangrene 05/12/2016  . Itching 02/24/2016  . Ventral hernia without obstruction or gangrene 02/24/2016  . Palliative care encounter   . Ascites   . Tachypnea   . Atrial flutter with rapid ventricular response (HCC)   . Hypomagnesemia   . Hypoxia   . Dehydration 01/22/2016  . Low back ache 12/30/2015  . Non-intractable vomiting with nausea   . Alcohol abuse with alcohol-induced mood disorder (HCC) 12/15/2015  . Acute alcoholism (HCC)   . Abdominal distension 11/04/2015  . Alcohol abuse   . Dyspnea   . Acute respiratory failure (HCC) 10/15/2015  . Ascites due to alcoholic cirrhosis (HCC) 10/15/2015  . Osteoporosis   . Multiple sclerosis (HCC)   . Alcoholic cirrhosis of liver with ascites (HCC) 08/22/2015  . Bipolar disorder, current  episode mixed, moderate (HCC) 08/01/2015  . Alcohol use disorder, severe, dependence (HCC) 07/31/2015  . Macrocytic anemia- due to alcohol abuse with normal B12 & folate levels 05/31/2015  . Severe protein-calorie malnutrition (HCC) 05/31/2015  . Hypokalemia 05/26/2015  . Encephalopathy, hepatic (HCC) 05/26/2015  . Alcohol withdrawal (HCC) 05/18/2015  . Abdominal pain 05/18/2015  . Anxiety 05/18/2015  . Stimulant abuse (HCC) 12/27/2014  . Nicotine abuse 12/27/2014  .  Hyperprolactinemia (HCC) 11/15/2014  . Dyslipidemia   . Alcohol use disorder, moderate, dependence (HCC) 10/30/2014  . Tobacco abuse 01/23/2014  . Bipolar 1 disorder (HCC) 04/07/2013  . Noncompliance with therapeutic plan 04/04/2013  . Hereditary and idiopathic peripheral neuropathy 05/01/2012  . Depression 05/01/2012  . GERD (gastroesophageal reflux disease) 05/01/2012  . Osteoarthrosis, unspecified whether generalized or localized, involving lower leg 05/01/2012  . Hyponatremia 07/01/2011    Past Surgical History:  Procedure Laterality Date  . ABDOMINAL WALL DEFECT REPAIR N/A 12/30/2016   Procedure: EXPLORATORY LAPAROTOMY WITH REPAIR ABDOMINAL WALL VENTRAL HERNIA;  Surgeon: [name], MD;  Location: [facility] OR;  Service: General;  Laterality: N/A;  . APPLICATION OF WOUND VAC N/A 12/30/2016   Procedure: APPLICATION OF WOUND VAC;  Surgeon: [name], MD;  Location: [facility] OR;  Service: General;  Laterality: N/A;  . CESAREAN SECTION  [phone]  . FRACTURE SURGERY    . HERNIA REPAIR    . MYRINGOTOMY WITH TUBE PLACEMENT Bilateral   . ORIF WRIST FRACTURE Left 09/09/2016   Procedure: OPEN REDUCTION INTERNAL FIXATION (ORIF) LEFT WRIST FRACTURE, LEFT CARPAL TUNNEL RELEASE;  Surgeon: Dominica Severin, MD;  Location: [facility] ORS;  Service: Orthopedics;  Laterality: Left;  . TONSILLECTOMY       OB History   No obstetric history on file.      Home Medications    Prior to Admission medications   Medication Sig Start Date End Date Taking? Authorizing Provider  acetaminophen (TYLENOL) 500 MG tablet Take 1 tablet (500 mg total) by mouth every 6 (six) hours as needed for mild pain. Patient taking differently: Take 1,000 mg by mouth every 6 (six) hours as needed for mild pain.  03/28/18   Rolly Salter, MD  albuterol (PROVENTIL HFA;VENTOLIN HFA) 108 (90 Base) MCG/ACT inhaler Inhale 2 puffs into the lungs every 4 (four) hours as needed for wheezing or shortness of breath.    [provider]  aluminum-magnesium hydroxide-simethicone (MAALOX) 200-200-20 MG/5ML SUSP Take 30 mLs by mouth 4 (four) times daily as needed (indigestion).     [provider]  chlordiazePOXIDE (LIBRIUM) 25 MG capsule 25mg  PO TID x 1D, then 25mg  PO BID X 1D, then 25mg  PO QD X 1D 05/09/18   Petrucelli, Samantha R, PA-C  diltiazem (CARDIZEM CD) 120 MG 24 hr capsule Take 1 capsule (120 mg total) by mouth daily for 30 days. 04/14/18 05/14/18  Tegeler, Canary Brim, MD  Fluticasone-Salmeterol [facility] INHUB) 250-50 MCG/DOSE AEPB Inhale 2 puffs into the lungs 2 (two) times daily.     [provider]  furosemide (LASIX) 40 MG tablet Take 40 mg twice daily till 04/02/2018, after that take 40 mg daily. Patient taking differently: Take 40 mg by mouth daily. Take 40 mg twice daily till 04/02/2018, after that take 40 mg daily. 04/14/18   Tegeler, Canary Brim, MD  gabapentin (NEURONTIN) 100 MG capsule Take 2 capsules (200 mg total) by mouth 3 (three) times daily for 30 days. 05/03/18 06/02/18  Henderly, Britni A, PA-C  hydrOXYzine (ATARAX/VISTARIL) 25 MG tablet Take 1  tablet (25 mg total) by mouth daily as needed for up to 30 doses (anxiety). Patient taking differently: Take 25 mg by mouth daily as needed for anxiety (withdrawal).  04/14/18   Tegeler, Canary Brim, MD  ipratropium-albuterol (DUONEB) 0.5-2.5 (3) MG/3ML SOLN Take 3 mLs by nebulization 2 (two) times daily. AND every 4 hours as needed for wheezing/shortness of breath    [provider]  lactulose (CHRONULAC) 10 GM/15ML solution Take 45 mLs (30 g total) by mouth 3 (three) times daily for 30 days. 04/14/18 05/14/18  Tegeler, Canary Brim, MD  lidocaine (LIDODERM) 5 % Place 1 patch onto the skin daily. Remove & Discard patch within 12 hours or as directed by MD 05/03/18   Henderly, Britni A, PA-C  magnesium oxide (MAG-OX) 400 MG tablet Take 400 mg by mouth daily.    [provider]  pantoprazole (PROTONIX) 40 MG tablet Take 1  tablet (40 mg total) by mouth daily for 30 days. 04/14/18 05/14/18  Tegeler, Canary Brim, MD  potassium chloride SA (K-DUR,KLOR-CON) 20 MEQ tablet Take 2 tablets (40 mEq total) by mouth 2 (two) times daily. 04/19/18   Bethel Born, PA-C  rizatriptan (MAXALT) 10 MG tablet Take 10 mg by mouth as needed for migraine. May repeat in 2 hours if needed    [provider]  rizatriptan (MAXALT) 5 MG tablet Take 5 mg by mouth as needed for migraine. May repeat in 2 hours if needed; not to exceed 30mg  in 24 hours    [provider]  spironolactone (ALDACTONE) 25 MG tablet Take 1 tablet (25 mg total) by mouth daily for 30 days. 04/14/18 05/14/18  Tegeler, Canary Brim, MD    Family History Family History  Problem Relation Age of Onset  . Arrhythmia Mother   . Neuropathy Mother   . Coronary artery disease Mother   . Heart disease Mother   . Heart disease Father   . Hypertension Father   . Heart attack Father   . Multiple sclerosis Maternal Aunt     Social History Social History   Tobacco Use  . Smoking status: Current Every Day Smoker    Packs/day: 1.00    Years: 36.00    Pack years: 36.00    Types: Cigarettes  . Smokeless tobacco: Never Used  Substance Use Topics  . Alcohol use: Yes    Comment: 1 bottle of wine per day per patient.   . Drug use: No     Allergies   Patient has no known allergies.   Review of Systems Review of Systems All other systems negative except as documented in the HPI. All pertinent positives and negatives as reviewed in the HPI. Physical Exam Updated Vital Signs BP 113/64   Pulse 88   Temp 97.6 F (36.4 C) (Oral)   Resp 18   SpO2 100%   Physical Exam Vitals signs and nursing note reviewed.  Constitutional:      General: She is not in acute distress.    Appearance: She is well-developed.  HENT:     Head: Normocephalic.   Eyes:     Pupils: Pupils are equal, round, and reactive to light.  Neck:     Musculoskeletal: Normal  range of motion and neck supple.  Cardiovascular:     Rate and Rhythm: Normal rate and regular rhythm.     Heart sounds: Normal heart sounds. No murmur. No friction rub. No gallop.   Pulmonary:     Effort: Pulmonary effort is normal.  No respiratory distress.     Breath sounds: Normal breath sounds. No wheezing.  Abdominal:     General: Bowel sounds are normal. There is no distension.     Palpations: Abdomen is soft.     Tenderness: There is no abdominal tenderness.  Skin:    General: Skin is warm and dry.     Capillary Refill: Capillary refill takes less than 2 seconds.     Findings: No erythema or rash.  Neurological:     Mental Status: She is alert and oriented to person, place, and time.     Motor: No abnormal muscle tone.     Coordination: Coordination normal.  Psychiatric:        Behavior: Behavior normal.      ED Treatments / Results  Labs (all labs ordered are listed, but only abnormal results are displayed) Labs Reviewed - No data to display  EKG None  Radiology Ct Head Wo Contrast  Result Date: 05/12/2018 CLINICAL DATA:  Head trauma, unwitnessed fall EXAM: CT HEAD WITHOUT CONTRAST TECHNIQUE: Contiguous axial images were obtained from the base of the skull through the vertex without intravenous contrast. COMPARISON:  05/09/2018 FINDINGS: Brain: There is atrophy and chronic small vessel disease changes. No acute intracranial abnormality. Specifically, no hemorrhage, hydrocephalus, mass lesion, acute infarction, or significant intracranial injury. Vascular: No hyperdense vessel or unexpected calcification. Skull: No acute calvarial abnormality. Sinuses/Orbits: Visualized paranasal sinuses and mastoids clear. Orbital soft tissues unremarkable. Other: None IMPRESSION: Atrophy, chronic microvascular disease. No acute intracranial abnormality. Electronically Signed   By: Charlett Nose M.D.   On: 05/12/2018 09:30    Procedures Procedures (including critical care time)   Medications Ordered in ED Medications - No data to display   Initial Impression / Assessment and Plan / ED Course  I have reviewed the triage vital signs and the nursing notes.  Pertinent labs & imaging results that were available during my care of the patient were reviewed by me and considered in my medical decision making (see chart for details).        Patient is a poor historian and is here quite frequently.  The patient will be discharged home and advised to follow-up with a primary doctor.  Her CT scan today does not show any abnormalities and she is and eating sandwiches and drinking fluids.  Final Clinical Impressions(s) / ED Diagnoses   Final diagnoses:  None    ED Discharge Orders    None       Charlestine Night, PA-C 05/12/18 1525    Virgina Norfolk, DO 05/15/18 1506

## 2018-05-12 NOTE — ED Notes (Addendum)
EDPA Provider at bedside. CHRIS LAWYER AND RACHEL C NT

## 2018-05-12 NOTE — ED Notes (Signed)
KIM H RN AT BEDSIDE ASSISTING PT WITH CARE

## 2018-05-12 NOTE — Progress Notes (Signed)
Went to room to discuss with pt drug rehab programs. Pt had left.  Isidoro Donning RN CCM Case Mgmt phone [phone]

## 2018-05-12 NOTE — ED Notes (Signed)
ASSIST DIRECT WENDY PRESENT AT DISCHARGE TO ASSIST

## 2018-05-12 NOTE — ED Notes (Addendum)
PT SITTING IN CHAIR TALKING ON PHONE. PT AMBULATING WITHOUT ASSISTANCE WITHOUT DIFFICULTY. PT CALLED DAUGHTER FOR A RIDE.

## 2018-05-12 NOTE — Discharge Instructions (Addendum)
Return here as needed. Follow up with your doctor. °

## 2018-05-12 NOTE — ED Notes (Addendum)
EDPA Provider at bedside. CHRIS UPDATED PT ON CURRENT STATUS

## 2018-05-12 NOTE — ED Notes (Signed)
PT ENDORSE SHE IS ON 2LNC AT HOME CONTI NOUS. PRESENTED ON RA. PLACED ON 2LNC, PT IN NO ACUTE DISTRESS. PT ENCOURAGED NOT TO REMOVE OXYGEN. PT RE DIRECTED MULT PILE TIMES DURING ASSESSMENT. PT NOT COMPLIANT WITH CARE

## 2018-05-12 NOTE — ED Notes (Signed)
Bed: NT61 Expected date:  Expected time:  Means of arrival:  Comments: EMS-hematoma

## 2018-05-12 NOTE — ED Notes (Signed)
Patient transported to CT 

## 2018-05-12 NOTE — ED Triage Notes (Signed)
Per [facility]- Pt unwitnessed fall. Pt reports crawling to bed and she called for assistance. Hematoma left eye and left side of face. No other deformity present. Pt able to ambulate with walker to stretcher. Pt endorse no LOC with fall. ETOH present. Denies fever, N/V/D at present.

## 2018-05-12 NOTE — Progress Notes (Signed)
CSW has faxed over Substance Abuse resources to [facility] at this time.     Claude Manges Ameriah Lint, MSW, LCSW-A Emergency Department Clinical Social Worker [phone]

## 2018-05-13 ENCOUNTER — Other Ambulatory Visit: Payer: Self-pay

## 2018-05-13 ENCOUNTER — Emergency Department (HOSPITAL_COMMUNITY)
Admission: EM | Admit: 2018-05-13 | Discharge: 2018-05-13 | Discharge status: Against Medical Advice | Payer: Self-pay | Attending: Emergency Medicine | Admitting: Emergency Medicine

## 2018-05-13 ENCOUNTER — Encounter (HOSPITAL_COMMUNITY): Payer: Self-pay

## 2018-05-13 ENCOUNTER — Encounter (HOSPITAL_COMMUNITY): Payer: Self-pay | Admitting: Emergency Medicine

## 2018-05-13 ENCOUNTER — Emergency Department (HOSPITAL_COMMUNITY)
Admission: EM | Admit: 2018-05-13 | Discharge: 2018-05-13 | Disposition: A | Discharge status: Home / Self Care | Payer: Self-pay | Attending: Emergency Medicine | Admitting: Emergency Medicine

## 2018-05-13 DIAGNOSIS — Z5321 Procedure and treatment not carried out due to patient leaving prior to being seen by health care provider: Secondary | ICD-10-CM | POA: Insufficient documentation

## 2018-05-13 DIAGNOSIS — R197 Diarrhea, unspecified: Secondary | ICD-10-CM | POA: Insufficient documentation

## 2018-05-13 DIAGNOSIS — J449 Chronic obstructive pulmonary disease, unspecified: Secondary | ICD-10-CM | POA: Insufficient documentation

## 2018-05-13 DIAGNOSIS — Z9981 Dependence on supplemental oxygen: Secondary | ICD-10-CM | POA: Insufficient documentation

## 2018-05-13 DIAGNOSIS — G35 Multiple sclerosis: Secondary | ICD-10-CM | POA: Insufficient documentation

## 2018-05-13 DIAGNOSIS — F17210 Nicotine dependence, cigarettes, uncomplicated: Secondary | ICD-10-CM | POA: Insufficient documentation

## 2018-05-13 DIAGNOSIS — K6289 Other specified diseases of anus and rectum: Secondary | ICD-10-CM | POA: Insufficient documentation

## 2018-05-13 DIAGNOSIS — Z79899 Other long term (current) drug therapy: Secondary | ICD-10-CM | POA: Insufficient documentation

## 2018-05-13 MED ORDER — LACTINEX PO CHEW
1.0000 | CHEWABLE_TABLET | Freq: Three times a day (TID) | ORAL | Status: DC
  Filled 2018-05-13 (×2): qty 1

## 2018-05-13 MED ORDER — IBUPROFEN 800 MG PO TABS
800.0000 mg | ORAL_TABLET | Freq: Once | ORAL | Status: DC
  Filled 2018-05-13: qty 1

## 2018-05-13 NOTE — ED Notes (Addendum)
Pt come out to bathroom with breast and butt hanging out of gown (would not cover self) and demanded someone wash her and change her clothes.She was provided with clothes as well soap and water to clean self. She refused. After returning to room she come back out and demanded Ginger Ale with gown still open and hanging off body. She was told nothing to drink until doctor sees her and allows her to have something to drink. She refused to go back to room wanting to see administrator. After verbal confrontation with staff and telling staff they are supposed to wait on her and wash her bottom security arrived and encouraged her to go back to room, at that time she demanded a cab be called for her. Again she was provided with items to clean self and refused, she left the facility with security  AMA, Dr Anitra Lauth made aware of pt demanding to leave facility (two gowns on to cover body) to go call for her own cab.

## 2018-05-13 NOTE — ED Triage Notes (Signed)
Patient is complaining of the fall yesterday. She wants her faced check. She is complaining of diarrhea and butt pain.

## 2018-05-13 NOTE — ED Notes (Signed)
Pt left and refused medicine and blood draw. EDP made aware.

## 2018-05-13 NOTE — ED Notes (Signed)
Bed: WA21 Expected date:  Expected time:  Means of arrival:  Comments: 58 yo rectal pain

## 2018-05-13 NOTE — ED Provider Notes (Signed)
[facility]-EMERGENCY DEPT Provider Note   CSN: [id number] Arrival date & time: 05/13/18  0144    History   Chief Complaint Chief Complaint  Patient presents with   Diarrhea   Follow-up    HPI Anne Black is a 58 y.o. female.     58 year old female with a history of frequent ED visits presents to the ED tonight complaining of diarrhea.  She was discharged late yesterday evening after being evaluated for a fall.  She states that she began having diarrhea at home at 2300.  Reports approximately 10 episodes of diarrhea since onset.  Denies any blood in her stool.  She has not had any vomiting.  No known fevers.  She has not taken any medications for her symptoms.  States that she came to the ED for Imodium.  Hx of ETOH abuse/dependence.  She has been seen in the ED 30 times in the past 6 months.  The history is provided by the patient. No language interpreter was used.  Diarrhea    Past Medical History:  Diagnosis Date   ADHD (attention deficit hyperactivity disorder)    Alcoholic cirrhosis (HCC)    with ascites   Alcoholism (HCC)    Allergies    Anxiety    Arthritis    Atrial fibrillation with RVR (HCC)    Bipolar disorder (HCC)    COPD (chronic obstructive pulmonary disease) (HCC)    wears 2L chronic O2   Dyspnea    GERD (gastroesophageal reflux disease)    History of hiatal hernia    Hypokalemia    Migraine    Multiple sclerosis (HCC)    Narcolepsy    Osteoarthritis of knee    Osteoporosis    Pneumonia     Patient Active Problem List   Diagnosis Date Noted   Alcohol dependence (HCC) 05/06/2018   Electrolyte disturbance 05/05/2018   Alcohol withdrawal delirium (HCC) 04/28/2018   Weakness generalized 04/27/2018   Gait abnormality 04/24/2018   Chronic hypoxemic respiratory failure (HCC) 04/11/2018   Atrial fibrillation with rapid ventricular response (HCC) 03/23/2018   Head injury    Fall 03/19/2018    Syncope 03/19/2018   Orbital fracture 03/19/2018   Scalp laceration 03/19/2018   Acute respiratory failure with hypoxia and hypercarbia (HCC) 03/19/2018   Abdominal tenderness 03/19/2018   Acute on chronic respiratory failure with hypoxia (HCC) 03/17/2018   Alcohol abuse with intoxication (HCC) 03/17/2018   ETOH abuse    Normocytic anemia 03/16/2018   Acute metabolic encephalopathy 03/16/2018   Lower leg edema 03/16/2018   Migraine 03/16/2018   Community acquired pneumonia of left lower lobe of lung (HCC)    Weakness 08/21/2017   COPD exacerbation (HCC) 06/12/2017   Chest pain with moderate risk for cardiac etiology 05/30/2017   Abnormal LFTs 05/30/2017   Tachycardia 05/30/2017   COPD (chronic obstructive pulmonary disease) (HCC) 04/19/2017   Right rib fracture 04/15/2017   Abdominal wall cellulitis 12/30/2016   S/P exploratory laparotomy 12/30/2016   Distal radius fracture, left 09/09/2016   Thrombocytopenia (HCC) 09/09/2016   Chest x-ray abnormality    Umbilical hernia without obstruction and without gangrene 05/12/2016   Itching 02/24/2016   Ventral hernia without obstruction or gangrene 02/24/2016   Palliative care encounter    Ascites    Tachypnea    Atrial flutter with rapid ventricular response (HCC)    Hypomagnesemia    Hypoxia    Dehydration 01/22/2016   Low back ache 12/30/2015   Non-intractable  vomiting with nausea    Alcohol abuse with alcohol-induced mood disorder (HCC) 12/15/2015   Acute alcoholism (HCC)    Abdominal distension 11/04/2015   Alcohol abuse    Dyspnea    Acute respiratory failure (HCC) 10/15/2015   Ascites due to alcoholic cirrhosis (HCC) 10/15/2015   Osteoporosis    Multiple sclerosis (HCC)    Alcoholic cirrhosis of liver with ascites (HCC) 08/22/2015   Bipolar disorder, current episode mixed, moderate (HCC) 08/01/2015   Alcohol use disorder, severe, dependence (HCC) 07/31/2015   Macrocytic  anemia- due to alcohol abuse with normal B12 & folate levels 05/31/2015   Severe protein-calorie malnutrition (HCC) 05/31/2015   Hypokalemia 05/26/2015   Encephalopathy, hepatic (HCC) 05/26/2015   Alcohol withdrawal (HCC) 05/18/2015   Abdominal pain 05/18/2015   Anxiety 05/18/2015   Stimulant abuse (HCC) 12/27/2014   Nicotine abuse 12/27/2014   Hyperprolactinemia (HCC) 11/15/2014   Dyslipidemia    Alcohol use disorder, moderate, dependence (HCC) 10/30/2014   Tobacco abuse 01/23/2014   Bipolar 1 disorder (HCC) 04/07/2013   Noncompliance with therapeutic plan 04/04/2013   Hereditary and idiopathic peripheral neuropathy 05/01/2012   Depression 05/01/2012   GERD (gastroesophageal reflux disease) 05/01/2012   Osteoarthrosis, unspecified whether generalized or localized, involving lower leg 05/01/2012   Hyponatremia 07/01/2011    Past Surgical History:  Procedure Laterality Date   ABDOMINAL WALL DEFECT REPAIR N/A 12/30/2016   Procedure: EXPLORATORY LAPAROTOMY WITH REPAIR ABDOMINAL WALL VENTRAL HERNIA;  Surgeon: [name], MD;  Location: [facility] OR;  Service: General;  Laterality: N/A;   APPLICATION OF WOUND VAC N/A 12/30/2016   Procedure: APPLICATION OF WOUND VAC;  Surgeon: [name], MD;  Location: [facility] OR;  Service: General;  Laterality: N/A;   CESAREAN SECTION  [phone]   FRACTURE SURGERY     HERNIA REPAIR     MYRINGOTOMY WITH TUBE PLACEMENT Bilateral    ORIF WRIST FRACTURE Left 09/09/2016   Procedure: OPEN REDUCTION INTERNAL FIXATION (ORIF) LEFT WRIST FRACTURE, LEFT CARPAL TUNNEL RELEASE;  Surgeon: Dominica Severin, MD;  Location: [facility] ORS;  Service: Orthopedics;  Laterality: Left;   TONSILLECTOMY       OB History   No obstetric history on file.      Home Medications    Prior to Admission medications   Medication Sig Start Date End Date Taking? Authorizing Provider  acetaminophen (TYLENOL) 500 MG tablet Take 1 tablet (500 mg total) by  mouth every 6 (six) hours as needed for mild pain. Patient taking differently: Take 1,000 mg by mouth every 6 (six) hours as needed for mild pain.  03/28/18  Yes Rolly Salter, MD  chlordiazePOXIDE (LIBRIUM) 25 MG capsule 25mg  PO TID x 1D, then 25mg  PO BID X 1D, then 25mg  PO QD X 1D 05/09/18  Yes Petrucelli, Samantha R, PA-C  diltiazem (CARDIZEM CD) 120 MG 24 hr capsule Take 1 capsule (120 mg total) by mouth daily for 30 days. 04/14/18 05/14/18 Yes Tegeler, Canary Brim, MD  furosemide (LASIX) 40 MG tablet Take 40 mg twice daily till 04/02/2018, after that take 40 mg daily. Patient taking differently: Take 40 mg by mouth daily. Take 40 mg twice daily till 04/02/2018, after that take 40 mg daily. 04/14/18  Yes Tegeler, Canary Brim, MD  gabapentin (NEURONTIN) 100 MG capsule Take 2 capsules (200 mg total) by mouth 3 (three) times daily for 30 days. 05/03/18 06/02/18 Yes Henderly, Britni A, PA-C  hydrOXYzine (ATARAX/VISTARIL) 25 MG tablet Take 1 tablet (25 mg total) by mouth daily as needed  for up to 30 doses (anxiety). Patient taking differently: Take 25 mg by mouth daily as needed for anxiety (withdrawal).  04/14/18  Yes Tegeler, Canary Brim, MD  ipratropium-albuterol (DUONEB) 0.5-2.5 (3) MG/3ML SOLN Take 3 mLs by nebulization 2 (two) times daily. AND every 4 hours as needed for wheezing/shortness of breath   Yes [provider]  lidocaine (LIDODERM) 5 % Place 1 patch onto the skin daily. Remove & Discard patch within 12 hours or as directed by MD 05/03/18  Yes Henderly, Britni A, PA-C  magnesium oxide (MAG-OX) 400 MG tablet Take 400 mg by mouth daily.   Yes [provider]  pantoprazole (PROTONIX) 40 MG tablet Take 1 tablet (40 mg total) by mouth daily for 30 days. 04/14/18 05/14/18 Yes Tegeler, Canary Brim, MD  potassium chloride SA (K-DUR,KLOR-CON) 20 MEQ tablet Take 2 tablets (40 mEq total) by mouth 2 (two) times daily. 04/19/18  Yes Bethel Born, PA-C  rizatriptan (MAXALT) 10 MG  tablet Take 10 mg by mouth as needed for migraine. May repeat in 2 hours if needed   Yes [provider]  spironolactone (ALDACTONE) 25 MG tablet Take 1 tablet (25 mg total) by mouth daily for 30 days. 04/14/18 05/14/18 Yes Tegeler, Canary Brim, MD  albuterol (PROVENTIL HFA;VENTOLIN HFA) 108 (90 Base) MCG/ACT inhaler Inhale 2 puffs into the lungs every 4 (four) hours as needed for wheezing or shortness of breath.    [provider]  aluminum-magnesium hydroxide-simethicone (MAALOX) 200-200-20 MG/5ML SUSP Take 30 mLs by mouth 4 (four) times daily as needed (indigestion).     [provider]  Fluticasone-Salmeterol (WIXELA INHUB) 250-50 MCG/DOSE AEPB Inhale 2 puffs into the lungs 2 (two) times daily.     [provider]  lactulose (CHRONULAC) 10 GM/15ML solution Take 45 mLs (30 g total) by mouth 3 (three) times daily for 30 days. 04/14/18 05/14/18  Tegeler, Canary Brim, MD  rizatriptan (MAXALT) 5 MG tablet Take 5 mg by mouth as needed for migraine. May repeat in 2 hours if needed; not to exceed  in 24 hours    [provider]    Family History Family History  Problem Relation Age of Onset   Arrhythmia Mother    Neuropathy Mother    Coronary artery disease Mother    Heart disease Mother    Heart disease Father    Hypertension Father    Heart attack Father    Multiple sclerosis Maternal Aunt     Social History Social History   Tobacco Use   Smoking status: Current Every Day Smoker    Packs/day: 1.00    Years: 36.00    Pack years: 36.00    Types: Cigarettes   Smokeless tobacco: Never Used  Substance Use Topics   Alcohol use: Yes    Comment: 1 bottle of wine per day per patient.    Drug use: No     Allergies   Patient has no known allergies.   Review of Systems Review of Systems  Gastrointestinal: Positive for diarrhea.  Ten systems reviewed and are negative for acute change, except as noted in the HPI.    Physical  Exam Updated Vital Signs BP 109/60 (BP Location: Left Arm)    Pulse 97    Temp 98.2 F (36.8 C) (Oral)    Resp 20    Ht  (1.575 m)    Wt 77.1 kg    SpO2 93%    BMI 31.09 kg/m   Physical Exam  Vitals signs and nursing note reviewed.  Constitutional:      General: She is not in acute distress.    Appearance: She is well-developed. She is not diaphoretic.     Comments: Patient in NAD  HENT:     Head: Normocephalic.     Comments: Periorbital contusion extending to the left cheek. Eyes:     General: No scleral icterus.    Extraocular Movements: Extraocular movements intact.     Conjunctiva/sclera: Conjunctivae normal.     Pupils: Pupils are equal, round, and reactive to light.     Comments: EOMs intact  Neck:     Musculoskeletal: Normal range of motion.  Cardiovascular:     Rate and Rhythm: Normal rate and regular rhythm.     Pulses: Normal pulses.  Pulmonary:     Effort: Pulmonary effort is normal. No respiratory distress.     Comments: Respirations even and unlabored Abdominal:     Comments: Abdomen obese, nontender.  Musculoskeletal: Normal range of motion.  Skin:    General: Skin is warm and dry.     Coloration: Skin is not pale.     Findings: No erythema or rash.  Neurological:     Mental Status: She is alert and oriented to person, place, and time.  Psychiatric:        Behavior: Behavior normal.      ED Treatments / Results  Labs (all labs ordered are listed, but only abnormal results are displayed) Labs Reviewed - No data to display  EKG None  Radiology Ct Head Wo Contrast  Result Date: 05/12/2018 CLINICAL DATA:  Head trauma, unwitnessed fall EXAM: CT HEAD WITHOUT CONTRAST TECHNIQUE: Contiguous axial images were obtained from the base of the skull through the vertex without intravenous contrast. COMPARISON:  05/09/2018 FINDINGS: Brain: There is atrophy and chronic small vessel disease changes. No acute intracranial abnormality. Specifically, no hemorrhage,  hydrocephalus, mass lesion, acute infarction, or significant intracranial injury. Vascular: No hyperdense vessel or unexpected calcification. Skull: No acute calvarial abnormality. Sinuses/Orbits: Visualized paranasal sinuses and mastoids clear. Orbital soft tissues unremarkable. Other: None IMPRESSION: Atrophy, chronic microvascular disease. No acute intracranial abnormality. Electronically Signed   By: Charlett Nose M.D.   On: 05/12/2018 09:30    Procedures Procedures (including critical care time)  Medications Ordered in ED Medications  lactobacillus acidophilus & bulgar (LACTINEX) chewable tablet 1 tablet (has no administration in time range)  ibuprofen (ADVIL,MOTRIN) tablet 800 mg (has no administration in time range)     Initial Impression / Assessment and Plan / ED Course  I have reviewed the triage vital signs and the nursing notes.  Pertinent labs & imaging results that were available during my care of the patient were reviewed by me and considered in my medical decision making (see chart for details).        2:15 AM Patient presenting for diarrhea which began at 2300. Discharged late yesterday afternoon following assessment after a fall. Abdomen soft, nontender. Patient in NAD. No diarrhea since arrival.  2:38 AM Patient up and ambulatory in the exam room without assistance.  She is seen standing upright, talking on the phone.  2:50 AM Patient refused blood draw, per RN. Seen ambulating from her room out of the department. Disposition set to [facility].   Final Clinical Impressions(s) / ED Diagnoses   Final diagnoses:  Diarrhea, unspecified type    ED Discharge Orders    None       Antony Madura, PA-C 05/13/18 1610  Molpus, Jonny Ruiz, MD 05/13/18 [phone]

## 2018-05-13 NOTE — ED Triage Notes (Signed)
Patient coming from home with c/o rectal pain and facial pain.

## 2018-05-14 ENCOUNTER — Emergency Department (HOSPITAL_COMMUNITY): Payer: Self-pay

## 2018-05-14 ENCOUNTER — Emergency Department (HOSPITAL_COMMUNITY)
Admission: EM | Admit: 2018-05-14 | Discharge: 2018-05-14 | Disposition: A | Discharge status: Home / Self Care | Payer: Self-pay | Attending: Emergency Medicine | Admitting: Emergency Medicine

## 2018-05-14 ENCOUNTER — Other Ambulatory Visit: Payer: Self-pay

## 2018-05-14 ENCOUNTER — Encounter (HOSPITAL_COMMUNITY): Payer: Self-pay | Admitting: *Deleted

## 2018-05-14 ENCOUNTER — Encounter (HOSPITAL_COMMUNITY): Payer: Self-pay

## 2018-05-14 DIAGNOSIS — F17210 Nicotine dependence, cigarettes, uncomplicated: Secondary | ICD-10-CM | POA: Insufficient documentation

## 2018-05-14 DIAGNOSIS — Z765 Malingerer [conscious simulation]: Secondary | ICD-10-CM | POA: Insufficient documentation

## 2018-05-14 DIAGNOSIS — Z79899 Other long term (current) drug therapy: Secondary | ICD-10-CM | POA: Insufficient documentation

## 2018-05-14 DIAGNOSIS — I4891 Unspecified atrial fibrillation: Secondary | ICD-10-CM | POA: Insufficient documentation

## 2018-05-14 DIAGNOSIS — F1010 Alcohol abuse, uncomplicated: Secondary | ICD-10-CM | POA: Insufficient documentation

## 2018-05-14 DIAGNOSIS — W010XXA Fall on same level from slipping, tripping and stumbling without subsequent striking against object, initial encounter: Secondary | ICD-10-CM | POA: Insufficient documentation

## 2018-05-14 DIAGNOSIS — S0990XA Unspecified injury of head, initial encounter: Secondary | ICD-10-CM | POA: Insufficient documentation

## 2018-05-14 DIAGNOSIS — F1020 Alcohol dependence, uncomplicated: Secondary | ICD-10-CM

## 2018-05-14 DIAGNOSIS — W19XXXA Unspecified fall, initial encounter: Secondary | ICD-10-CM

## 2018-05-14 DIAGNOSIS — R69 Illness, unspecified: Secondary | ICD-10-CM | POA: Insufficient documentation

## 2018-05-14 DIAGNOSIS — Y939 Activity, unspecified: Secondary | ICD-10-CM | POA: Insufficient documentation

## 2018-05-14 DIAGNOSIS — Y998 Other external cause status: Secondary | ICD-10-CM | POA: Insufficient documentation

## 2018-05-14 DIAGNOSIS — G35 Multiple sclerosis: Secondary | ICD-10-CM | POA: Insufficient documentation

## 2018-05-14 DIAGNOSIS — J449 Chronic obstructive pulmonary disease, unspecified: Secondary | ICD-10-CM | POA: Insufficient documentation

## 2018-05-14 DIAGNOSIS — Y92009 Unspecified place in unspecified non-institutional (private) residence as the place of occurrence of the external cause: Secondary | ICD-10-CM | POA: Insufficient documentation

## 2018-05-14 HISTORY — DX: Alcohol dependence, uncomplicated: F10.20

## 2018-05-14 MED ORDER — ACETAMINOPHEN 325 MG PO TABS
650.0000 mg | ORAL_TABLET | Freq: Once | ORAL | Status: AC
  Administered 2018-05-14: 650 mg via ORAL
  Filled 2018-05-14: qty 2

## 2018-05-14 MED ORDER — GABAPENTIN 100 MG PO CAPS
200.0000 mg | ORAL_CAPSULE | Freq: Once | ORAL | Status: AC
  Administered 2018-05-14: 18:00:00 200 mg via ORAL
  Filled 2018-05-14: qty 2

## 2018-05-14 NOTE — ED Notes (Signed)
Called [facility] 

## 2018-05-14 NOTE — ED Notes (Signed)
[facility] wanting to refuse transport back home. States they do not feel comfortable leaving pt in the house in the condition it is in.Marland KitchenMarland KitchenMarland Kitchen

## 2018-05-14 NOTE — ED Notes (Signed)
Patient urinated on floor while standing in doorway for no reason. Patient just shook head and stated "I couldn't help it". RN informed patient that patient will clean herself and the area on floor that patient willingly urinated on.

## 2018-05-14 NOTE — ED Notes (Signed)
Bed: WA20 Expected date:  Expected time:  Means of arrival:  Comments: 58 yo ETOH / Detox

## 2018-05-14 NOTE — ED Triage Notes (Signed)
Patient arrived via [facility] from home. Patient is AOx4 and ambulatory. Patient was seen yesterday at [facility]. Patient chief complaint is ETOH Detox and Nausea and pain in buttock. Patient has no other complaints.  Patient is currently in bathroom cleaning self due to having bowel movements on self. Patient had dried feces on gown that was stuck to bottom. Patient has possible feces in hair. Patient did admit to consuming ETOH yesterday when discharged and last night.  Patient has ambulated to restroom and back to room without complication.

## 2018-05-14 NOTE — ED Notes (Signed)
Patient ambulated to restroom without assistance and ambulated from restroom to room without complication or assistance.

## 2018-05-14 NOTE — ED Notes (Signed)
Patient transported to CT 

## 2018-05-14 NOTE — ED Notes (Signed)
[facility] has been called  

## 2018-05-14 NOTE — Discharge Instructions (Signed)
By coming to the emergency department repeatedly without a specific medical condition, you are significantly increasing your risk of contracting coronavirus and becoming severely ill requiring an admission to the ICU on a ventilator.  You have severe medical conditions such as COPD, heart problems and alcohol abuse which puts you at much higher risk of severe complications from coronavirus. It is extremely important that you take care of yourself at home.  You must take all of your regularly prescribed medications, try to gradually taper your use of alcohol.  Obviously, you cannot quit cold [place] or you will experience alcohol withdrawal.  Do not smoke. Only return to the emergency department if you are specifically having worsening in your medical conditions.  Call us in advance to advise [place] of your symptoms and discuss a plan.  Do not come to the emergency department because you are alcohol intoxicated, at this time of coronavirus, that is putting your self and others at risk of spreading the disease. Coronavirus (COVID-19) Are you at risk?  Are you at risk for the Coronavirus (COVID-19)?  To be considered HIGH RISK for Coronavirus (COVID-19), you have to meet the following criteria:  Traveled to [place], [place], [place], [place] or [place]; or in the [place] to [place], [place], [place], or [place]; and have fever, cough, and shortness of breath within the last 2 weeks of travel OR Been in close contact with a person diagnosed with COVID-19 within the last 2 weeks and have fever, cough, and shortness of breath IF YOU DO NOT MEET THESE CRITERIA, YOU ARE CONSIDERED LOW RISK FOR COVID-19.  What to do if you are HIGH RISK for COVID-19?  If you are having a medical emergency, call 911. Seek medical care right away. Before you go to a doctors office, urgent care or emergency department, call ahead and tell them about your recent travel, contact with someone diagnosed with COVID-19, and  your symptoms. You should receive instructions from your physicians office regarding next steps of care.  When you arrive at healthcare provider, tell the healthcare staff immediately you have returned from visiting [place], [place], [place], [place] or [place]; or traveled in the [place] to [place], [place], [place], or [place]; in the last two weeks or you have been in close contact with a person diagnosed with COVID-19 in the last 2 weeks.   Tell the health care staff about your symptoms: fever, cough and shortness of breath. After you have been seen by a medical provider, you will be either: Tested for (COVID-19) and discharged home on quarantine except to seek medical care if symptoms worsen, and asked to  Stay home and avoid contact with others until you get your results (4-5 days)  Avoid travel on public transportation if possible (such as bus, train, or airplane) or Sent to the Emergency Department by EMS for evaluation, COVID-19 testing, and possible admission depending on your condition and test results.  What to do if you are LOW RISK for COVID-19?  Reduce your risk of any infection by using the same precautions used for avoiding the common cold or flu:  Wash your hands often with soap and warm water for at least 20 seconds.  If soap and water are not readily available, use an alcohol-based hand sanitizer with at least 60% alcohol.  If coughing or sneezing, cover your mouth and nose by coughing or sneezing into the elbow areas of your shirt or coat, into a tissue or  into your sleeve (not your hands). Avoid shaking hands with others and consider head nods or verbal greetings only. Avoid touching your eyes, nose, or mouth with unwashed hands.  Avoid close contact with people who are sick. Avoid places or events with large numbers of people in one location, like concerts or sporting events. Carefully consider travel plans you have or are making. If you are planning any travel  outside or inside the [place], visit the [facility] webpage for the latest health notices. If you have some symptoms but not all symptoms, continue to monitor at home and seek medical attention if your symptoms worsen. If you are having a medical emergency, call 911.   ADDITIONAL HEALTHCARE OPTIONS FOR PATIENTS  [facility] / e-Visit: [email or web address]         [facility]: [phone]  [facility]: [phone]                   [facility]: [phone]

## 2018-05-14 NOTE — ED Provider Notes (Signed)
[facility] EMERGENCY DEPARTMENT Provider Note   CSN: [id number] Arrival date & time: 05/14/18  1539    History   Chief Complaint Chief Complaint  Patient presents with   Fall    HPI Anne Black is a 58 y.o. female presenting to emergency department today with chief complaint of fall.  Patient states she was walking in her house using a walker while wearing socks causing her to slip on the wood floor and landing on her left side.  She hit her head and face on the hardwood floor.  Patient states she was drinking alcohol prior to this fall.  She is unsure what time this happened and estimates she was not on the ground for more than 30 minutes, she was able to call EMS herself. Pt reports chronic pain from her MS. After today's fall she has new pain in her head, neck, left hip and left knee. She describes them as aching. The pain is constant and she rates it 6/10 in severity. She did not take anything for pain prior to arrival. She denies fever, chills, cough, chest pain, shortness of breath, abdominal pain, nausea, vomiting, diarrhea. History provided by patient and by viewing EMR.   Past Medical History:  Diagnosis Date   ADHD (attention deficit hyperactivity disorder)    Alcoholic cirrhosis (HCC)    with ascites   Alcoholism (HCC)    Allergies    Anxiety    Arthritis    Atrial fibrillation with RVR (HCC)    Bipolar disorder (HCC)    COPD (chronic obstructive pulmonary disease) (HCC)    wears 2L chronic O2   Dyspnea    EtOH dependence (HCC) 05/14/2018   GERD (gastroesophageal reflux disease)    History of hiatal hernia    Hypokalemia    Migraine    Multiple sclerosis (HCC)    Narcolepsy    Osteoarthritis of knee    Osteoporosis    Pneumonia     Patient Active Problem List   Diagnosis Date Noted   Alcohol dependence (HCC) 05/06/2018   Electrolyte disturbance 05/05/2018   Alcohol withdrawal delirium (HCC) 04/28/2018    Weakness generalized 04/27/2018   Gait abnormality 04/24/2018   Chronic hypoxemic respiratory failure (HCC) 04/11/2018   Atrial fibrillation with rapid ventricular response (HCC) 03/23/2018   Head injury    Fall 03/19/2018   Syncope 03/19/2018   Orbital fracture 03/19/2018   Scalp laceration 03/19/2018   Acute respiratory failure with hypoxia and hypercarbia (HCC) 03/19/2018   Abdominal tenderness 03/19/2018   Acute on chronic respiratory failure with hypoxia (HCC) 03/17/2018   Alcohol abuse with intoxication (HCC) 03/17/2018   ETOH abuse    Normocytic anemia 03/16/2018   Acute metabolic encephalopathy 03/16/2018   Lower leg edema 03/16/2018   Migraine 03/16/2018   Community acquired pneumonia of left lower lobe of lung (HCC)    Weakness 08/21/2017   COPD exacerbation (HCC) 06/12/2017   Chest pain with moderate risk for cardiac etiology 05/30/2017   Abnormal LFTs 05/30/2017   Tachycardia 05/30/2017   COPD (chronic obstructive pulmonary disease) (HCC) 04/19/2017   Right rib fracture 04/15/2017   Abdominal wall cellulitis 12/30/2016   S/P exploratory laparotomy 12/30/2016   Distal radius fracture, left 09/09/2016   Thrombocytopenia (HCC) 09/09/2016   Chest x-ray abnormality    Umbilical hernia without obstruction and without gangrene 05/12/2016   Itching 02/24/2016   Ventral hernia without obstruction or gangrene 02/24/2016   Palliative care encounter    Ascites  Tachypnea    Atrial flutter with rapid ventricular response (HCC)    Hypomagnesemia    Hypoxia    Dehydration 01/22/2016   Low back ache 12/30/2015   Non-intractable vomiting with nausea    Alcohol abuse with alcohol-induced mood disorder (HCC) 12/15/2015   Acute alcoholism (HCC)    Abdominal distension 11/04/2015   Alcohol abuse    Dyspnea    Acute respiratory failure (HCC) 10/15/2015   Ascites due to alcoholic cirrhosis (HCC) 10/15/2015   Osteoporosis     Multiple sclerosis (HCC)    Alcoholic cirrhosis of liver with ascites (HCC) 08/22/2015   Bipolar disorder, current episode mixed, moderate (HCC) 08/01/2015   Alcohol use disorder, severe, dependence (HCC) 07/31/2015   Macrocytic anemia- due to alcohol abuse with normal B12 & folate levels 05/31/2015   Severe protein-calorie malnutrition (HCC) 05/31/2015   Hypokalemia 05/26/2015   Encephalopathy, hepatic (HCC) 05/26/2015   Alcohol withdrawal (HCC) 05/18/2015   Abdominal pain 05/18/2015   Anxiety 05/18/2015   Stimulant abuse (HCC) 12/27/2014   Nicotine abuse 12/27/2014   Hyperprolactinemia (HCC) 11/15/2014   Dyslipidemia    Alcohol use disorder, moderate, dependence (HCC) 10/30/2014   Tobacco abuse 01/23/2014   Bipolar 1 disorder (HCC) 04/07/2013   Noncompliance with therapeutic plan 04/04/2013   Hereditary and idiopathic peripheral neuropathy 05/01/2012   Depression 05/01/2012   GERD (gastroesophageal reflux disease) 05/01/2012   Osteoarthrosis, unspecified whether generalized or localized, involving lower leg 05/01/2012   Hyponatremia 07/01/2011    Past Surgical History:  Procedure Laterality Date   ABDOMINAL WALL DEFECT REPAIR N/A 12/30/2016   Procedure: EXPLORATORY LAPAROTOMY WITH REPAIR ABDOMINAL WALL VENTRAL HERNIA;  Surgeon: [name], MD;  Location: [facility] OR;  Service: General;  Laterality: N/A;   APPLICATION OF WOUND VAC N/A 12/30/2016   Procedure: APPLICATION OF WOUND VAC;  Surgeon: [name], MD;  Location: [facility] OR;  Service: General;  Laterality: N/A;   CESAREAN SECTION  [phone]   FRACTURE SURGERY     HERNIA REPAIR     MYRINGOTOMY WITH TUBE PLACEMENT Bilateral    ORIF WRIST FRACTURE Left 09/09/2016   Procedure: OPEN REDUCTION INTERNAL FIXATION (ORIF) LEFT WRIST FRACTURE, LEFT CARPAL TUNNEL RELEASE;  Surgeon: Dominica Severin, MD;  Location: [facility] ORS;  Service: Orthopedics;  Laterality: Left;   TONSILLECTOMY       OB History     No obstetric history on file.      Home Medications    Prior to Admission medications   Medication Sig Start Date End Date Taking? Authorizing Provider  acetaminophen (TYLENOL) 500 MG tablet Take 1 tablet (500 mg total) by mouth every 6 (six) hours as needed for mild pain. Patient taking differently: Take 1,000 mg by mouth every 6 (six) hours as needed for mild pain.  03/28/18   Rolly Salter, MD  albuterol (PROVENTIL HFA;VENTOLIN HFA) 108 (90 Base) MCG/ACT inhaler Inhale 2 puffs into the lungs every 4 (four) hours as needed for wheezing or shortness of breath.    [provider]  aluminum-magnesium hydroxide-simethicone (MAALOX) 200-200-20 MG/5ML SUSP Take 30 mLs by mouth 4 (four) times daily as needed (indigestion).     [provider]  chlordiazePOXIDE (LIBRIUM) 25 MG capsule 25mg  PO TID x 1D, then 25mg  PO BID X 1D, then 25mg  PO QD X 1D 05/09/18   Petrucelli, Samantha R, PA-C  diltiazem (CARDIZEM CD) 120 MG 24 hr capsule Take 1 capsule (120 mg total) by mouth daily for 30 days. 04/14/18 05/14/18  Tegeler, Canary Brim,  MD  Fluticasone-Salmeterol (WIXELA INHUB) 250-50 MCG/DOSE AEPB Inhale 2 puffs into the lungs 2 (two) times daily.     [provider]  furosemide (LASIX) 40 MG tablet Take 40 mg twice daily till 04/02/2018, after that take 40 mg daily. Patient taking differently: Take 40 mg by mouth daily. Take 40 mg twice daily till 04/02/2018, after that take 40 mg daily. 04/14/18   Tegeler, Canary Brim, MD  gabapentin (NEURONTIN) 100 MG capsule Take 2 capsules (200 mg total) by mouth 3 (three) times daily for 30 days. 05/03/18 06/02/18  Henderly, Britni A, PA-C  hydrOXYzine (ATARAX/VISTARIL) 25 MG tablet Take 1 tablet (25 mg total) by mouth daily as needed for up to 30 doses (anxiety). Patient taking differently: Take 25 mg by mouth daily as needed for anxiety (withdrawal).  04/14/18   Tegeler, Canary Brim, MD  ipratropium-albuterol (DUONEB) 0.5-2.5 (3) MG/3ML SOLN  Take 3 mLs by nebulization 2 (two) times daily. AND every 4 hours as needed for wheezing/shortness of breath    [provider]  lidocaine (LIDODERM) 5 % Place 1 patch onto the skin daily. Remove & Discard patch within 12 hours or as directed by MD 05/03/18   Henderly, Britni A, PA-C  magnesium oxide (MAG-OX) 400 MG tablet Take 400 mg by mouth daily.    [provider]  pantoprazole (PROTONIX) 40 MG tablet Take 1 tablet (40 mg total) by mouth daily for 30 days. 04/14/18 05/14/18  Tegeler, Canary Brim, MD  potassium chloride SA (K-DUR,KLOR-CON) 20 MEQ tablet Take 2 tablets (40 mEq total) by mouth 2 (two) times daily. 04/19/18   Bethel Born, PA-C  rizatriptan (MAXALT) 10 MG tablet Take 10 mg by mouth as needed for migraine. May repeat in 2 hours if needed    [provider]  rizatriptan (MAXALT) 5 MG tablet Take 5 mg by mouth as needed for migraine. May repeat in 2 hours if needed; not to exceed 30mg  in 24 hours    [provider]  spironolactone (ALDACTONE) 25 MG tablet Take 1 tablet (25 mg total) by mouth daily for 30 days. 04/14/18 05/14/18  Tegeler, Canary Brim, MD    Family History Family History  Problem Relation Age of Onset   Arrhythmia Mother    Neuropathy Mother    Coronary artery disease Mother    Heart disease Mother    Heart disease Father    Hypertension Father    Heart attack Father    Multiple sclerosis Maternal Aunt     Social History Social History   Tobacco Use   Smoking status: Current Every Day Smoker    Packs/day: 1.00    Years: 36.00    Pack years: 36.00    Types: Cigarettes   Smokeless tobacco: Never Used  Substance Use Topics   Alcohol use: Yes    Comment: Chronic ETOH abuse   Drug use: No     Allergies   Patient has no known allergies.   Review of Systems Review of Systems  Constitutional: Negative for chills and fever.  HENT: Negative for congestion, ear discharge, ear pain, sinus pressure, sinus  pain and sore throat.   Eyes: Negative for pain, discharge, redness and visual disturbance.  Respiratory: Negative for cough and shortness of breath.   Cardiovascular: Negative for chest pain.  Gastrointestinal: Negative for abdominal pain, constipation, diarrhea, nausea and vomiting.  Genitourinary: Negative for dysuria and hematuria.  Musculoskeletal: Positive for arthralgias, myalgias and neck pain. Negative for back pain.  Skin:  Positive for color change and wound.  Neurological: Negative for weakness, numbness and headaches.     Physical Exam Updated Vital Signs BP (!) 109/53    Pulse 94    Temp 98.1 F (36.7 C) (Rectal)    SpO2 100% Comment: SpO2 84% on Room Air  Physical Exam Vitals signs and nursing note reviewed.  Constitutional:      Comments: Pt is alert and oriented. She is in no acute distress.  HENT:     Head: Normocephalic.     Jaw: There is normal jaw occlusion. No tenderness or pain on movement.     Comments: Scalp is non tender to palpation. No bleeding or wounds noted in oral mucosa. Large hematoma on left side of her face that appears to be healing. No open wounds or lacerations, no bleeding.     Right Ear: Tympanic membrane normal. There is impacted cerumen.     Left Ear: Tympanic membrane normal. There is impacted cerumen.  Eyes:     General: No scleral icterus.    Conjunctiva/sclera: Conjunctivae normal.     Comments: Left eye is intact and there is no sign of intraocular injuries on exam. EOMS intact.  Neck:     Musculoskeletal: Normal range of motion.     Comments: Full ROM intact. Midline cervical tenderness. No bony stepoffs or deformities, No paraspinous muscle TTP or muscle spasms. No rigidity or meningeal signs. No bruising, erythema, or swelling. Cardiovascular:     Rate and Rhythm: Normal rate and regular rhythm.     Pulses: Normal pulses.          Radial pulses are 2+ on the right side and 2+ on the left side.     Heart sounds: Normal heart  sounds.     Comments: Symmetric 1+ bilateral pitting edema in lower extremities. Moves all extremities with purposeful movements. Pulmonary:     Effort: Pulmonary effort is normal.     Breath sounds: Normal breath sounds.  Abdominal:     General: There is no distension.     Palpations: Abdomen is soft.     Tenderness: There is no abdominal tenderness.  Musculoskeletal: Normal range of motion.     Right lower leg: 1+ Pitting Edema present.     Left lower leg: 1+ Pitting Edema present.     Comments: Pelvis is stable. Bony tenderness to palpation of left hip and left knee. Patellar reflex 2+ bilaterally.  Skin:    General: Skin is warm and dry.  Neurological:     Mental Status: She is alert and oriented to person, place, and time.     Comments: Alert, awake, oriented. Speech is clear and goal oriented, follows commands. No active tremor. Movements are purposeful and coordinated. CN III-XII intact, no facial droop Normal strength in upper and lower extremities bilaterally including dorsiflexion and plantar flexion, strong and equal grip strength Sensation normal to light and sharp touch Normal finger to nose and rapid alternating movements  Psychiatric:        Behavior: Behavior normal.      ED Treatments / Results  Labs (all labs ordered are listed, but only abnormal results are displayed) Labs Reviewed - No data to display  EKG None  Radiology Ct Head Wo Contrast  Result Date: 05/14/2018 CLINICAL DATA:  Head trauma status post fall EXAM: CT HEAD WITHOUT CONTRAST CT CERVICAL SPINE WITHOUT CONTRAST TECHNIQUE: Multidetector CT imaging of the head and cervical spine was performed following the standard protocol without  intravenous contrast. Multiplanar CT image reconstructions of the cervical spine were also generated. COMPARISON:  None. FINDINGS: CT HEAD FINDINGS Brain: No evidence of acute infarction, hemorrhage, extra-axial collection, ventriculomegaly, or mass effect. Generalized  cerebral atrophy. Periventricular white matter low attenuation likely secondary to microangiopathy. Vascular: Cerebrovascular atherosclerotic calcifications are noted. Skull: Negative for fracture or focal lesion. Sinuses/Orbits: Visualized portions of the orbits are unremarkable. Visualized portions of the paranasal sinuses and mastoid air cells are unremarkable. Other: None. CT CERVICAL SPINE FINDINGS Significant patient motion degrades image quality limiting evaluation. Alignment: Normal. Skull base and vertebrae: No acute fracture. No primary bone lesion or focal pathologic process. Soft tissues and spinal canal: No prevertebral fluid or swelling. No visible canal hematoma. Disc levels:  Disc spaces are preserved. Upper chest: Lung apices are clear. Other: No fluid collection or hematoma. IMPRESSION: 1. No acute intracranial pathology. 2. No acute osseous injury of the cervical spine. Evaluation of the cervical spine is limited secondary to patient motion throughout the examination. Electronically Signed   By: Elige Ko   On: 05/14/2018 18:53   Ct Cervical Spine Wo Contrast  Result Date: 05/14/2018 CLINICAL DATA:  Head trauma status post fall EXAM: CT HEAD WITHOUT CONTRAST CT CERVICAL SPINE WITHOUT CONTRAST TECHNIQUE: Multidetector CT imaging of the head and cervical spine was performed following the standard protocol without intravenous contrast. Multiplanar CT image reconstructions of the cervical spine were also generated. COMPARISON:  None. FINDINGS: CT HEAD FINDINGS Brain: No evidence of acute infarction, hemorrhage, extra-axial collection, ventriculomegaly, or mass effect. Generalized cerebral atrophy. Periventricular white matter low attenuation likely secondary to microangiopathy. Vascular: Cerebrovascular atherosclerotic calcifications are noted. Skull: Negative for fracture or focal lesion. Sinuses/Orbits: Visualized portions of the orbits are unremarkable. Visualized portions of the paranasal  sinuses and mastoid air cells are unremarkable. Other: None. CT CERVICAL SPINE FINDINGS Significant patient motion degrades image quality limiting evaluation. Alignment: Normal. Skull base and vertebrae: No acute fracture. No primary bone lesion or focal pathologic process. Soft tissues and spinal canal: No prevertebral fluid or swelling. No visible canal hematoma. Disc levels:  Disc spaces are preserved. Upper chest: Lung apices are clear. Other: No fluid collection or hematoma. IMPRESSION: 1. No acute intracranial pathology. 2. No acute osseous injury of the cervical spine. Evaluation of the cervical spine is limited secondary to patient motion throughout the examination. Electronically Signed   By: Elige Ko   On: 05/14/2018 18:53   Dg Knee Complete 4 Views Left  Result Date: 05/14/2018 CLINICAL DATA:  Larey Seat 2 days ago.  Left knee pain. EXAM: LEFT KNEE - COMPLETE 4+ VIEW COMPARISON:  04/14/2018 FINDINGS: No evidence of fracture, dislocation, or joint effusion. No evidence of arthropathy or other focal bone abnormality. Soft tissues are unremarkable. IMPRESSION: Negative. Electronically Signed   By: Paulina Fusi M.D.   On: 05/14/2018 17:15   Dg Hip Unilat With Pelvis 2-3 Views Left  Result Date: 05/14/2018 CLINICAL DATA:  Larey Seat 2 days ago.  Left hip pain. EXAM: DG HIP (WITH OR WITHOUT PELVIS) 2-3V LEFT COMPARISON:  04/09/2018 FINDINGS: There is no evidence of hip fracture or dislocation. There is no evidence of arthropathy or other focal bone abnormality. IMPRESSION: Negative. Electronically Signed   By: Paulina Fusi M.D.   On: 05/14/2018 17:14    Procedures Procedures (including critical care time)  Medications Ordered in ED Medications  acetaminophen (TYLENOL) tablet 650 mg (650 mg Oral Given 05/14/18 1740)  gabapentin (NEURONTIN) capsule 200 mg (200 mg Oral Given 05/14/18 1740)  Initial Impression / Assessment and Plan / ED Course  I have reviewed the triage vital signs and the nursing  notes.  Pertinent labs & imaging results that were available during my care of the patient were reviewed by me and considered in my medical decision making (see chart for details).  Pt is afebrile, nontoxic appearing. She is frequent visitor to the emergency department and was discharged earlier today. She falls frequently and reports drinking alcohol daily.  I viewed her CT head, neck and they do not show any bleeding or acute injury. Also viewed her xrays of left hip, knee and do not see evidence of fracture or dislocation, I agree with radiologist reading.  Pt given PO tylenol and home dose of gabapentin for pain. I went to update pt on negative imaging results and she is standing in the door way demanding to be discharged and for [facility]AR to be called. I assisted her back to bed and she had steady gait. Her SpO2 on room air was 94%. She is tolerating PO fluids. Patient is hemodynamically stable. Evaluation does not show pathology that would require ongoing emergent intervention or inpatient treatment. I explained the diagnosis to the patient. Pain has been managed and has no complaints prior to discharge. Patient is comfortable with above plan and is stable for discharge at this time. All questions were answered prior to disposition. Strict return precautions for returning to the ED were discussed. Encouraged follow up with PCP. Pt case discussed with Dr. Juleen China who agrees with my plan.   This note was prepared with assistance of Conservation officer, historic buildings. Occasional wrong-word or sound-a-like substitutions may have occurred due to the inherent limitations of voice recognition software.   Final Clinical Impressions(s) / ED Diagnoses   Final diagnoses:  Fall  Fall, initial encounter    ED Discharge Orders    None       Kathyrn Lass 05/15/18 0211    Raeford Razor, MD 05/17/18 1408

## 2018-05-14 NOTE — ED Notes (Signed)
Assisted pt to get redressed. Refused discharge vitals.

## 2018-05-14 NOTE — ED Provider Notes (Signed)
[facility]-EMERGENCY DEPT Provider Note   CSN: [id number] Arrival date & time: 05/14/18  0941    History   Chief Complaint Chief Complaint  Patient presents with  . Alcohol Problem  . Nausea    HPI Anne Black is a 58 y.o. female.     HPI Patient has been to the emergency department multiple times, 32 visits in 6 months.  Patient is brought by EMS.'s chief complaint is patient requesting alcohol detoxification and nausea and pain in buttocks.  I approached the patient to evaluated her in the room.  She is standing at the end of the bed.  She has just urinated on the floor.  He had previously been in bed and patient's nurse reports that he had just been with her she had made no request to go to the bathroom.  She got out of the bed and stood at the end of the bed and urinated.  I asked the patient what brought her into the emergency department.  She advised that she is not really sure why she decided to come here.  I asked her why she decided to call EMS to which she replied she is not sure.  That point she cannot think of a specific complaint but continued to request that I would "help her get back into the bed".  Notably, the patient is off oxygen with oxygen saturation at 93%, he is opposed to be on a baseline of 2 L nasal cannula. Past Medical History:  Diagnosis Date  . ADHD (attention deficit hyperactivity disorder)   . Alcoholic cirrhosis (HCC)    with ascites  . Alcoholism (HCC)   . Allergies   . Anxiety   . Arthritis   . Atrial fibrillation with RVR (HCC)   . Bipolar disorder (HCC)   . COPD (chronic obstructive pulmonary disease) (HCC)    wears 2L chronic O2  . Dyspnea   . EtOH dependence (HCC) 05/14/2018  . GERD (gastroesophageal reflux disease)   . History of hiatal hernia   . Hypokalemia   . Migraine   . Multiple sclerosis (HCC)   . Narcolepsy   . Osteoarthritis of knee   . Osteoporosis   . Pneumonia     Patient Active Problem List   Diagnosis Date Noted  . Alcohol dependence (HCC) 05/06/2018  . Electrolyte disturbance 05/05/2018  . Alcohol withdrawal delirium (HCC) 04/28/2018  . Weakness generalized 04/27/2018  . Gait abnormality 04/24/2018  . Chronic hypoxemic respiratory failure (HCC) 04/11/2018  . Atrial fibrillation with rapid ventricular response (HCC) 03/23/2018  . Head injury   . Fall 03/19/2018  . Syncope 03/19/2018  . Orbital fracture 03/19/2018  . Scalp laceration 03/19/2018  . Acute respiratory failure with hypoxia and hypercarbia (HCC) 03/19/2018  . Abdominal tenderness 03/19/2018  . Acute on chronic respiratory failure with hypoxia (HCC) 03/17/2018  . Alcohol abuse with intoxication (HCC) 03/17/2018  . ETOH abuse   . Normocytic anemia 03/16/2018  . Acute metabolic encephalopathy 03/16/2018  . Lower leg edema 03/16/2018  . Migraine 03/16/2018  . Community acquired pneumonia of left lower lobe of lung (HCC)   . Weakness 08/21/2017  . COPD exacerbation (HCC) 06/12/2017  . Chest pain with moderate risk for cardiac etiology 05/30/2017  . Abnormal LFTs 05/30/2017  . Tachycardia 05/30/2017  . COPD (chronic obstructive pulmonary disease) (HCC) 04/19/2017  . Right rib fracture 04/15/2017  . Abdominal wall cellulitis 12/30/2016  . S/P exploratory laparotomy 12/30/2016  . Distal radius fracture,  left 09/09/2016  . Thrombocytopenia (HCC) 09/09/2016  . Chest x-ray abnormality   . Umbilical hernia without obstruction and without gangrene 05/12/2016  . Itching 02/24/2016  . Ventral hernia without obstruction or gangrene 02/24/2016  . Palliative care encounter   . Ascites   . Tachypnea   . Atrial flutter with rapid ventricular response (HCC)   . Hypomagnesemia   . Hypoxia   . Dehydration 01/22/2016  . Low back ache 12/30/2015  . Non-intractable vomiting with nausea   . Alcohol abuse with alcohol-induced mood disorder (HCC) 12/15/2015  . Acute alcoholism (HCC)   . Abdominal distension 11/04/2015  .  Alcohol abuse   . Dyspnea   . Acute respiratory failure (HCC) 10/15/2015  . Ascites due to alcoholic cirrhosis (HCC) 10/15/2015  . Osteoporosis   . Multiple sclerosis (HCC)   . Alcoholic cirrhosis of liver with ascites (HCC) 08/22/2015  . Bipolar disorder, current episode mixed, moderate (HCC) 08/01/2015  . Alcohol use disorder, severe, dependence (HCC) 07/31/2015  . Macrocytic anemia- due to alcohol abuse with normal B12 & folate levels 05/31/2015  . Severe protein-calorie malnutrition (HCC) 05/31/2015  . Hypokalemia 05/26/2015  . Encephalopathy, hepatic (HCC) 05/26/2015  . Alcohol withdrawal (HCC) 05/18/2015  . Abdominal pain 05/18/2015  . Anxiety 05/18/2015  . Stimulant abuse (HCC) 12/27/2014  . Nicotine abuse 12/27/2014  . Hyperprolactinemia (HCC) 11/15/2014  . Dyslipidemia   . Alcohol use disorder, moderate, dependence (HCC) 10/30/2014  . Tobacco abuse 01/23/2014  . Bipolar 1 disorder (HCC) 04/07/2013  . Noncompliance with therapeutic plan 04/04/2013  . Hereditary and idiopathic peripheral neuropathy 05/01/2012  . Depression 05/01/2012  . GERD (gastroesophageal reflux disease) 05/01/2012  . Osteoarthrosis, unspecified whether generalized or localized, involving lower leg 05/01/2012  . Hyponatremia 07/01/2011    Past Surgical History:  Procedure Laterality Date  . ABDOMINAL WALL DEFECT REPAIR N/A 12/30/2016   Procedure: EXPLORATORY LAPAROTOMY WITH REPAIR ABDOMINAL WALL VENTRAL HERNIA;  Surgeon: [name], MD;  Location[facility] OR;  Service: General;  Laterality: N/A;  . APPLICATION OF WOUND VAC N/A 12/30/2016   Procedure: APPLICATION OF WOUND VAC;  Surgeon: [name], MD;  Location: [facility] OR;  Service: General;  Laterality: N/A;  . CESAREAN SECTION  [phone]  . FRACTURE SURGERY    . HERNIA REPAIR    . MYRINGOTOMY WITH TUBE PLACEMENT Bilateral   . ORIF WRIST FRACTURE Left 09/09/2016   Procedure: OPEN REDUCTION INTERNAL FIXATION (ORIF) LEFT WRIST FRACTURE, LEFT  CARPAL TUNNEL RELEASE;  Surgeon: Dominica Severin, MD;  Location: [facility] ORS;  Service: Orthopedics;  Laterality: Left;  . TONSILLECTOMY       OB History   No obstetric history on file.      Home Medications    Prior to Admission medications   Medication Sig Start Date End Date Taking? Authorizing Provider  acetaminophen (TYLENOL) 500 MG tablet Take 1 tablet (500 mg total) by mouth every 6 (six) hours as needed for mild pain. Patient taking differently: Take 1,000 mg by mouth every 6 (six) hours as needed for mild pain.  03/28/18   Rolly Salter, MD  albuterol (PROVENTIL HFA;VENTOLIN HFA) 108 (90 Base) MCG/ACT inhaler Inhale 2 puffs into the lungs every 4 (four) hours as needed for wheezing or shortness of breath.    [provider]  aluminum-magnesium hydroxide-simethicone (MAALOX) 200-200-20 MG/5ML SUSP Take 30 mLs by mouth 4 (four) times daily as needed (indigestion).     [provider]  chlordiazePOXIDE (LIBRIUM) 25 MG capsule  PO TID x  1D, then  PO BID X 1D, then  PO QD X 1D 05/09/18   Petrucelli, Samantha R, PA-C  diltiazem (CARDIZEM CD) 120 MG 24 hr capsule Take 1 capsule (120 mg total) by mouth daily for 30 days. 04/14/18 05/14/18  Tegeler, Canary Brim, MD  Fluticasone-Salmeterol [facility] INHUB) 250-50 MCG/DOSE AEPB Inhale 2 puffs into the lungs 2 (two) times daily.     [provider]  furosemide (LASIX) 40 MG tablet Take 40 mg twice daily till 04/02/2018, after that take 40 mg daily. Patient taking differently: Take 40 mg by mouth daily. Take 40 mg twice daily till 04/02/2018, after that take 40 mg daily. 04/14/18   Tegeler, Canary Brim, MD  gabapentin (NEURONTIN) 100 MG capsule Take 2 capsules (200 mg total) by mouth 3 (three) times daily for 30 days. 05/03/18 06/02/18  Henderly, Britni A, PA-C  hydrOXYzine (ATARAX/VISTARIL) 25 MG tablet Take 1 tablet (25 mg total) by mouth daily as needed for up to 30 doses (anxiety). Patient taking differently: Take  25 mg by mouth daily as needed for anxiety (withdrawal).  04/14/18   Tegeler, Canary Brim, MD  ipratropium-albuterol (DUONEB) 0.5-2.5 (3) MG/3ML SOLN Take 3 mLs by nebulization 2 (two) times daily. AND every 4 hours as needed for wheezing/shortness of breath    [provider]  lactulose (CHRONULAC) 10 GM/15ML solution Take 45 mLs (30 g total) by mouth 3 (three) times daily for 30 days. 04/14/18 05/14/18  Tegeler, Canary Brim, MD  lidocaine (LIDODERM) 5 % Place 1 patch onto the skin daily. Remove & Discard patch within 12 hours or as directed by MD 05/03/18   Henderly, Britni A, PA-C  magnesium oxide (MAG-OX) 400 MG tablet Take 400 mg by mouth daily.    [provider]  pantoprazole (PROTONIX) 40 MG tablet Take 1 tablet (40 mg total) by mouth daily for 30 days. 04/14/18 05/14/18  Tegeler, Canary Brim, MD  potassium chloride SA (K-DUR,KLOR-CON) 20 MEQ tablet Take 2 tablets (40 mEq total) by mouth 2 (two) times daily. 04/19/18   Bethel Born, PA-C  rizatriptan (MAXALT) 10 MG tablet Take 10 mg by mouth as needed for migraine. May repeat in 2 hours if needed    [provider]  rizatriptan (MAXALT) 5 MG tablet Take 5 mg by mouth as needed for migraine. May repeat in 2 hours if needed; not to exceed  in 24 hours    [provider]  spironolactone (ALDACTONE) 25 MG tablet Take 1 tablet (25 mg total) by mouth daily for 30 days. 04/14/18 05/14/18  Tegeler, Canary Brim, MD    Family History Family History  Problem Relation Age of Onset  . Arrhythmia Mother   . Neuropathy Mother   . Coronary artery disease Mother   . Heart disease Mother   . Heart disease Father   . Hypertension Father   . Heart attack Father   . Multiple sclerosis Maternal Aunt     Social History Social History   Tobacco Use  . Smoking status: Current Every Day Smoker    Packs/day: 1.00    Years: 36.00    Pack years: 36.00    Types: Cigarettes  . Smokeless tobacco: Never Used  Substance  Use Topics  . Alcohol use: Yes    Comment: Chronic ETOH abuse  . Drug use: No     Allergies   Patient has no known allergies.   Review of Systems Review of Systems 10 Systems reviewed and are negative for acute  change except as noted in the HPI.   Physical Exam Updated Vital Signs BP 117/72   Pulse 99   Temp (!) 97.5 F (36.4 C) (Oral)   Resp 20   Ht 5\' 2"  (1.575 m)   Wt 77 kg   SpO2 95%   BMI 31.05 kg/m   Physical Exam Constitutional:      Comments: Patient is alert and wandering around in the room as I come for evaluation.  She is standing at the end of the bed and has just urinated a large puddle on the floor that she is walking in.  Respiratory distress at rest.  Patient's blood pressure on monitor is normal and oxygen saturation is 93% on room air.  She does not show any respiratory distress at rest.  HENT:     Head:     Comments: She has resolving large hematoma to the left side of her face.  Her extraocular motions are intact.    Mouth/Throat:     Mouth: Mucous membranes are moist.     Pharynx: Oropharynx is clear.  Eyes:     Extraocular Movements: Extraocular movements intact.  Cardiovascular:     Comments: heart is regular.  No gross rub murmur gallop.  She is not tachycardic. Pulmonary:     Comments: No respiratory distress at rest.  Patient is not exhibiting any cough for the time that I have been with her in the room.  He has soft breath sounds throughout consistent with COPD.  No appreciable crackles. Abdominal:     Comments: Soft mildly distended abdomen.  No guarding.  Musculoskeletal:     Comments: 1-2+ pitting edema bilateral lower legs.  Symmetric.  Patient is ambulating and can go from sitting to standing independently.  He is using both upper extremities purposefully.  Skin:    General: Skin is warm and dry.  Neurological:     Comments: Patient is awake and alert and oriented.  suspect she is intoxicated but she is situationally oriented and can  speak in clear sentences.  She is not exhibiting any active tremor or signs of active withdrawal.  No signs of acute delirium.  Movements are coordinated purposeful and symmetric to the extent of patient's general physical deconditioning.  Psychiatric:        Mood and Affect: Mood normal.      ED Treatments / Results  Labs (all labs ordered are listed, but only abnormal results are displayed) Labs Reviewed - No data to display  EKG None  Radiology No results found.  Procedures Procedures (including critical care time)  Medications Ordered in ED Medications - No data to display   Initial Impression / Assessment and Plan / ED Course  I have reviewed the triage vital signs and the nursing notes.  Pertinent labs & imaging results that were available during my care of the patient were reviewed by me and considered in my medical decision making (see chart for details).       Patient has multiple and recurrent presentations to the emergency department.  She has no specific complaints this morning.  She is by physical examination at baseline.  She is alert and using all neurologic capacities.  Can self dress, stand, ambulate to the bathroom and back once instructed to do so and not to pee on the floor.  No evidence of focal neurologic dysfunction, her spontaneous activity she is exhibiting lower extremity strength and function.  There is no lateralizing of any of her motor  activities.  COPD and A. fib are stable at this time.  She is actually stable on room air as she took her oxygen off.  Not exhibiting any cough or fever.  He is not tachycardic and by auscultation heart is regular.  Unfortunately, patient has severe comorbid illness paired with mental illness and alcohol abuse.  Patient is high risk for contracting coronavirus as well as basic decompensation of her underlying medical conditions.  Patient has been strongly counseled on trying to avoid this frequency of visits to the  hospital due to risk to herself of contracting coronavirus with poor outcome.  She is counseled on calling prior to coming.  Unfortunately, patient is high risk for returning to the emergency department as well as decompensating medical condition.  Final Clinical Impressions(s) / ED Diagnoses   Final diagnoses:  Alcohol abuse  Malingering  Severe comorbid illness    ED Discharge Orders    None       Arby Barrette, MD 05/14/18 1112

## 2018-05-14 NOTE — ED Notes (Signed)
[facility] has arrived. 

## 2018-05-14 NOTE — ED Notes (Signed)
Patient has been placed in clean blue paper scrubs, blue socks and fresh clean diaper.

## 2018-05-14 NOTE — Discharge Instructions (Addendum)
You have been seen today after a fall. Please read and follow all provided instructions. Return to the emergency room for worsening condition or new concerning symptoms.    1. Medications:  Continue usual home medications Take medications as prescribed. Please review all of the medicines and only take them if you do not have an allergy to them.   2. Treatment: rest, drink plenty of fluids  3. Follow Up: Please follow up with your primary doctor in 2-5 days for discussion of your diagnoses and further evaluation after today's visit; Call today to arrange your follow up.  If you do not have a primary care doctor use the resource guide provided to find one;   It is also a possibility that you have an allergic reaction to any of the medicines that you have been prescribed - Everybody reacts differently to medications and while MOST people have no trouble with most medicines, you may have a reaction such as nausea, vomiting, rash, swelling, shortness of breath. If this is the case, please stop taking the medicine immediately and contact your physician.  ?

## 2018-05-15 ENCOUNTER — Emergency Department (HOSPITAL_COMMUNITY)
Admission: EM | Admit: 2018-05-15 | Discharge: 2018-05-15 | Disposition: A | Discharge status: Home / Self Care | Payer: Self-pay | Attending: Emergency Medicine | Admitting: Emergency Medicine

## 2018-05-15 ENCOUNTER — Other Ambulatory Visit: Payer: Self-pay

## 2018-05-15 ENCOUNTER — Encounter (HOSPITAL_COMMUNITY): Payer: Self-pay | Admitting: Emergency Medicine

## 2018-05-15 DIAGNOSIS — R110 Nausea: Secondary | ICD-10-CM

## 2018-05-15 DIAGNOSIS — F17210 Nicotine dependence, cigarettes, uncomplicated: Secondary | ICD-10-CM | POA: Insufficient documentation

## 2018-05-15 DIAGNOSIS — J449 Chronic obstructive pulmonary disease, unspecified: Secondary | ICD-10-CM | POA: Insufficient documentation

## 2018-05-15 DIAGNOSIS — Z79899 Other long term (current) drug therapy: Secondary | ICD-10-CM | POA: Insufficient documentation

## 2018-05-15 LAB — COMPREHENSIVE METABOLIC PANEL
ALT: 20 U/L (ref 0–44)
AST: 47 U/L — ABNORMAL HIGH (ref 15–41)
Albumin: 3.1 g/dL — ABNORMAL LOW (ref 3.5–5.0)
Alkaline Phosphatase: 122 U/L (ref 38–126)
Anion gap: 8 (ref 5–15)
BUN: <5 mg/dL — ABNORMAL LOW (ref 6–20)
CO2: 29 mmol/L (ref 22–32)
Calcium: 7.8 mg/dL — ABNORMAL LOW (ref 8.9–10.3)
Chloride: 101 mmol/L (ref 98–111)
Creatinine, Ser: 0.39 mg/dL — ABNORMAL LOW (ref 0.44–1.00)
GFR calc Af Amer: >60 mL/min (ref >60)
GFR calc non Af Amer: >60 mL/min (ref >60)
Glucose, Bld: 78 mg/dL (ref 70–99)
Potassium: 3.7 mmol/L (ref 3.5–5.1)
Sodium: 138 mmol/L (ref 135–145)
Total Bilirubin: 2 mg/dL — ABNORMAL HIGH (ref 0.3–1.2)
Total Protein: 6.3 g/dL — ABNORMAL LOW (ref 6.5–8.1)

## 2018-05-15 LAB — CBC WITH DIFFERENTIAL/PLATELET
Abs Immature Granulocytes: 0.02 10*3/uL (ref 0.00–0.07)
Basophils Absolute: 0.1 10*3/uL (ref 0.0–0.1)
Basophils Relative: 1 %
Eosinophils Absolute: 0.1 10*3/uL (ref 0.0–0.5)
Eosinophils Relative: 1 %
HCT: 32.1 % — ABNORMAL LOW (ref 36.0–46.0)
Hemoglobin: 9 g/dL — ABNORMAL LOW (ref 12.0–15.0)
Immature Granulocytes: 1 %
Lymphocytes Relative: 15 %
Lymphs Abs: 0.6 10*3/uL — ABNORMAL LOW (ref 0.7–4.0)
MCH: 22.8 pg — ABNORMAL LOW (ref 26.0–34.0)
MCHC: 28 g/dL — ABNORMAL LOW (ref 30.0–36.0)
MCV: 81.5 fL (ref 80.0–100.0)
Monocytes Absolute: 0.6 10*3/uL (ref 0.1–1.0)
Monocytes Relative: 14 %
Neutro Abs: 2.9 10*3/uL (ref 1.7–7.7)
Neutrophils Relative %: 68 %
Platelets: 114 10*3/uL — ABNORMAL LOW (ref 150–400)
RBC: 3.94 MIL/uL (ref 3.87–5.11)
RDW: 21.7 % — ABNORMAL HIGH (ref 11.5–15.5)
WBC: 4.3 10*3/uL (ref 4.0–10.5)
nRBC: 0 % (ref 0.0–0.2)

## 2018-05-15 LAB — LIPASE, BLOOD: Lipase: 22 U/L (ref 11–51)

## 2018-05-15 MED ORDER — ONDANSETRON HCL 4 MG PO TABS: 4.0000 mg | ORAL_TABLET | Freq: Three times a day (TID) | ORAL | 0 refills | Status: DC | PRN

## 2018-05-15 MED ORDER — IBUPROFEN 200 MG PO TABS
400.0000 mg | ORAL_TABLET | Freq: Once | ORAL | Status: AC
  Administered 2018-05-15: 400 mg via ORAL
  Filled 2018-05-15: qty 2

## 2018-05-15 MED ORDER — ONDANSETRON 4 MG PO TBDP
4.0000 mg | ORAL_TABLET | Freq: Once | ORAL | Status: AC
  Administered 2018-05-15: 4 mg via ORAL
  Filled 2018-05-15: qty 1

## 2018-05-15 NOTE — ED Notes (Signed)
Pt keeps leaving patient room without wearing her mask, patient has been made aware the risks of not wearing her mask in the halls of the emergency room

## 2018-05-15 NOTE — ED Notes (Signed)
[facility] arrived.  

## 2018-05-15 NOTE — ED Notes (Signed)
Patient requesting for [facility], patient explained that none of her labs are back yet that were sent off at 0405, and that she has not been discharged yet.

## 2018-05-15 NOTE — ED Notes (Signed)
Bed: WA21 Expected date:  Expected time:  Means of arrival:  Comments: 58 yo F/nausea

## 2018-05-15 NOTE — ED Provider Notes (Signed)
Pt was seen in the ER and discharged home last night. Has been waiting for a ride in the waiting room. There was a mix up and it was thought that the patient would like to check back into the ER.  This was not the case. The patient is waiting for a ride from her ex husband.    Azalia Bilis, MD 05/15/18 [phone]

## 2018-05-15 NOTE — ED Notes (Signed)
Pt ambulating to bathroom.

## 2018-05-15 NOTE — ED Triage Notes (Signed)
Pt arrived via [facility] Pt complains of nausea and has fever of 100.2 Patient was in ED at [facility] earlier today with complaint of nausea  Hx of COPD, chronic cough, wears 2 L [place] at baseline  Vitals 138/74 BP HR 100 98% on 2L [place]

## 2018-05-15 NOTE — ED Notes (Signed)
Patient refusing to leave, security to bedside to assist in discharge.

## 2018-05-15 NOTE — ED Notes (Signed)
Bed: [facility]PT4 Expected date:  Expected time:  Means of arrival:  Comments: 

## 2018-05-15 NOTE — ED Notes (Signed)
[facility] called, stated paramedics are en route to get the patient

## 2018-05-15 NOTE — ED Provider Notes (Signed)
[facility]-EMERGENCY DEPT Provider Note   CSN: [id number] Arrival date & time: 05/15/18  2409    History   Chief Complaint Chief Complaint  Patient presents with  . Nausea    HPI Anne Black is a 58 y.o. female.     The history is provided by the patient.  Weakness  Severity:  Mild Onset quality:  Gradual Timing:  Constant Chronicity:  New Context: not alcohol use, not dehydration and not recent infection   Relieved by:  None tried Worsened by:  Nothing Ineffective treatments:  None tried Associated symptoms: no abdominal pain, no dysuria and no fever     Past Medical History:  Diagnosis Date  . ADHD (attention deficit hyperactivity disorder)   . Alcoholic cirrhosis (HCC)    with ascites  . Alcoholism (HCC)   . Allergies   . Anxiety   . Arthritis   . Atrial fibrillation with RVR (HCC)   . Bipolar disorder (HCC)   . COPD (chronic obstructive pulmonary disease) (HCC)    wears 2L chronic O2  . Dyspnea   . EtOH dependence (HCC) 05/14/2018  . GERD (gastroesophageal reflux disease)   . History of hiatal hernia   . Hypokalemia   . Migraine   . Multiple sclerosis (HCC)   . Narcolepsy   . Osteoarthritis of knee   . Osteoporosis   . Pneumonia     Patient Active Problem List   Diagnosis Date Noted  . Alcohol dependence (HCC) 05/06/2018  . Electrolyte disturbance 05/05/2018  . Alcohol withdrawal delirium (HCC) 04/28/2018  . Weakness generalized 04/27/2018  . Gait abnormality 04/24/2018  . Chronic hypoxemic respiratory failure (HCC) 04/11/2018  . Atrial fibrillation with rapid ventricular response (HCC) 03/23/2018  . Head injury   . Fall 03/19/2018  . Syncope 03/19/2018  . Orbital fracture 03/19/2018  . Scalp laceration 03/19/2018  . Acute respiratory failure with hypoxia and hypercarbia (HCC) 03/19/2018  . Abdominal tenderness 03/19/2018  . Acute on chronic respiratory failure with hypoxia (HCC) 03/17/2018  . Alcohol abuse with  intoxication (HCC) 03/17/2018  . ETOH abuse   . Normocytic anemia 03/16/2018  . Acute metabolic encephalopathy 03/16/2018  . Lower leg edema 03/16/2018  . Migraine 03/16/2018  . Community acquired pneumonia of left lower lobe of lung (HCC)   . Weakness 08/21/2017  . COPD exacerbation (HCC) 06/12/2017  . Chest pain with moderate risk for cardiac etiology 05/30/2017  . Abnormal LFTs 05/30/2017  . Tachycardia 05/30/2017  . COPD (chronic obstructive pulmonary disease) (HCC) 04/19/2017  . Right rib fracture 04/15/2017  . Abdominal wall cellulitis 12/30/2016  . S/P exploratory laparotomy 12/30/2016  . Distal radius fracture, left 09/09/2016  . Thrombocytopenia (HCC) 09/09/2016  . Chest x-ray abnormality   . Umbilical hernia without obstruction and without gangrene 05/12/2016  . Itching 02/24/2016  . Ventral hernia without obstruction or gangrene 02/24/2016  . Palliative care encounter   . Ascites   . Tachypnea   . Atrial flutter with rapid ventricular response (HCC)   . Hypomagnesemia   . Hypoxia   . Dehydration 01/22/2016  . Low back ache 12/30/2015  . Non-intractable vomiting with nausea   . Alcohol abuse with alcohol-induced mood disorder (HCC) 12/15/2015  . Acute alcoholism (HCC)   . Abdominal distension 11/04/2015  . Alcohol abuse   . Dyspnea   . Acute respiratory failure (HCC) 10/15/2015  . Ascites due to alcoholic cirrhosis (HCC) 10/15/2015  . Osteoporosis   . Multiple sclerosis (HCC)   .  Alcoholic cirrhosis of liver with ascites (HCC) 08/22/2015  . Bipolar disorder, current episode mixed, moderate (HCC) 08/01/2015  . Alcohol use disorder, severe, dependence (HCC) 07/31/2015  . Macrocytic anemia- due to alcohol abuse with normal B12 & folate levels 05/31/2015  . Severe protein-calorie malnutrition (HCC) 05/31/2015  . Hypokalemia 05/26/2015  . Encephalopathy, hepatic (HCC) 05/26/2015  . Alcohol withdrawal (HCC) 05/18/2015  . Abdominal pain 05/18/2015  . Anxiety  05/18/2015  . Stimulant abuse (HCC) 12/27/2014  . Nicotine abuse 12/27/2014  . Hyperprolactinemia (HCC) 11/15/2014  . Dyslipidemia   . Alcohol use disorder, moderate, dependence (HCC) 10/30/2014  . Tobacco abuse 01/23/2014  . Bipolar 1 disorder (HCC) 04/07/2013  . Noncompliance with therapeutic plan 04/04/2013  . Hereditary and idiopathic peripheral neuropathy 05/01/2012  . Depression 05/01/2012  . GERD (gastroesophageal reflux disease) 05/01/2012  . Osteoarthrosis, unspecified whether generalized or localized, involving lower leg 05/01/2012  . Hyponatremia 07/01/2011    Past Surgical History:  Procedure Laterality Date  . ABDOMINAL WALL DEFECT REPAIR N/A 12/30/2016   Procedure: EXPLORATORY LAPAROTOMY WITH REPAIR ABDOMINAL WALL VENTRAL HERNIA;  Surgeon: Emelia LoronWakefield, Matthew, MD;  [facility];  Service: General;  Laterality: N/A;  . APPLICATION OF WOUND VAC N/A 12/30/2016   Procedure: APPLICATION OF WOUND VAC;  Surgeon: Emelia LoronWakefield, Matthew, MD;  [facility];  Service: General;  Laterality: N/A;  . CESAREAN SECTION  [phone]  . FRACTURE SURGERY    . HERNIA REPAIR    . MYRINGOTOMY WITH TUBE PLACEMENT Bilateral   . ORIF WRIST FRACTURE Left 09/09/2016   Procedure: OPEN REDUCTION INTERNAL FIXATION (ORIF) LEFT WRIST FRACTURE, LEFT CARPAL TUNNEL RELEASE;  Surgeon: Dominica SeverinGramig, William, MD;  Location: [facility] ORS;  Service: Orthopedics;  Laterality: Left;  . TONSILLECTOMY       OB History   No obstetric history on file.      Home Medications    Prior to Admission medications   Medication Sig Start Date End Date Taking? Authorizing Provider  acetaminophen (TYLENOL) 500 MG tablet Take 1 tablet (500 mg total) by mouth every 6 (six) hours as needed for mild pain. Patient taking differently: Take 1,000 mg by mouth every 6 (six) hours as needed for mild pain.  03/28/18   Rolly SalterPatel, Pranav M, MD  albuterol (PROVENTIL HFA;VENTOLIN HFA) 108 (90 Base) MCG/ACT inhaler Inhale 2 puffs into the lungs every  4 (four) hours as needed for wheezing or shortness of breath.    [provider]  aluminum-magnesium hydroxide-simethicone (MAALOX) 200-200-20 MG/5ML SUSP Take 30 mLs by mouth 4 (four) times daily as needed (indigestion).     [provider]  chlordiazePOXIDE (LIBRIUM) 25 MG capsule 25mg  PO TID x 1D, then 25mg  PO BID X 1D, then 25mg  PO QD X 1D 05/09/18   Petrucelli, Samantha R, PA-C  diltiazem (CARDIZEM CD) 120 MG 24 hr capsule Take 1 capsule (120 mg total) by mouth daily for 30 days. 04/14/18 05/14/18  Tegeler, Canary Brimhristopher J, MD  Fluticasone-Salmeterol [facility](WIXELA INHUB) 250-50 MCG/DOSE AEPB Inhale 2 puffs into the lungs 2 (two) times daily.     [provider]  furosemide (LASIX) 40 MG tablet Take 40 mg twice daily till 04/02/2018, after that take 40 mg daily. Patient taking differently: Take 40 mg by mouth daily. Take 40 mg twice daily till 04/02/2018, after that take 40 mg daily. 04/14/18   Tegeler, Canary Brimhristopher J, MD  gabapentin (NEURONTIN) 100 MG capsule Take 2 capsules (200 mg total) by mouth 3 (three) times daily for 30 days. 05/03/18 06/02/18  Henderly, Britni A, PA-C  hydrOXYzine (ATARAX/VISTARIL) 25 MG tablet Take 1 tablet (25 mg total) by mouth daily as needed for up to 30 doses (anxiety). Patient taking differently: Take 25 mg by mouth daily as needed for anxiety (withdrawal).  04/14/18   Tegeler, Canary Brim, MD  ipratropium-albuterol (DUONEB) 0.5-2.5 (3) MG/3ML SOLN Take 3 mLs by nebulization 2 (two) times daily. AND every 4 hours as needed for wheezing/shortness of breath    [provider]  lidocaine (LIDODERM) 5 % Place 1 patch onto the skin daily. Remove & Discard patch within 12 hours or as directed by MD 05/03/18   Henderly, Britni A, PA-C  magnesium oxide (MAG-OX) 400 MG tablet Take 400 mg by mouth daily.    [provider]  ondansetron (ZOFRAN) 4 MG tablet Take 1 tablet (4 mg total) by mouth every 8 (eight) hours as needed for nausea or vomiting.  05/15/18   Shemeka Wardle, Barbara Cower, MD  pantoprazole (PROTONIX) 40 MG tablet Take 1 tablet (40 mg total) by mouth daily for 30 days. 04/14/18 05/14/18  Tegeler, Canary Brim, MD  potassium chloride SA (K-DUR,KLOR-CON) 20 MEQ tablet Take 2 tablets (40 mEq total) by mouth 2 (two) times daily. 04/19/18   Bethel Born, PA-C  rizatriptan (MAXALT) 10 MG tablet Take 10 mg by mouth as needed for migraine. May repeat in 2 hours if needed    [provider]  rizatriptan (MAXALT) 5 MG tablet Take 5 mg by mouth as needed for migraine. May repeat in 2 hours if needed; not to exceed  in 24 hours    [provider]  spironolactone (ALDACTONE) 25 MG tablet Take 1 tablet (25 mg total) by mouth daily for 30 days. 04/14/18 05/14/18  Tegeler, Canary Brim, MD    Family History Family History  Problem Relation Age of Onset  . Arrhythmia Mother   . Neuropathy Mother   . Coronary artery disease Mother   . Heart disease Mother   . Heart disease Father   . Hypertension Father   . Heart attack Father   . Multiple sclerosis Maternal Aunt     Social History Social History   Tobacco Use  . Smoking status: Current Every Day Smoker    Packs/day: 1.00    Years: 36.00    Pack years: 36.00    Types: Cigarettes  . Smokeless tobacco: Never Used  Substance Use Topics  . Alcohol use: Yes    Comment: Chronic ETOH abuse  . Drug use: No     Allergies   Patient has no known allergies.   Review of Systems Review of Systems  Constitutional: Negative for fever.  Gastrointestinal: Negative for abdominal pain.  Genitourinary: Negative for dysuria.  Neurological: Positive for weakness.  All other systems reviewed and are negative.    Physical Exam Updated Vital Signs BP (!) 121/52   Pulse 99   Temp 98.3 F (36.8 C) (Rectal)   Resp 18   SpO2 100%   Physical Exam Vitals signs and nursing note reviewed.  Constitutional:      Appearance: She is well-developed.  HENT:     Head: Normocephalic.      Comments: Large area of ecchymosis around left face    Mouth/Throat:     Mouth: Mucous membranes are moist.  Eyes:     Pupils: Pupils are equal, round, and reactive to light.  Neck:     Musculoskeletal: Normal range of motion.  Cardiovascular:     Rate and Rhythm: Normal  rate and regular rhythm.  Pulmonary:     Effort: No respiratory distress.     Breath sounds: No stridor.  Abdominal:     General: There is no distension.  Musculoskeletal: Normal range of motion.        General: No swelling or tenderness.  Skin:    General: Skin is warm and dry.  Neurological:     General: No focal deficit present.     Mental Status: She is alert.      ED Treatments / Results  Labs (all labs ordered are listed, but only abnormal results are displayed) Labs Reviewed  CBC WITH DIFFERENTIAL/PLATELET - Abnormal; Notable for the following components:      Result Value   Hemoglobin 9.0 (*)    HCT 32.1 (*)    MCH 22.8 (*)    MCHC 28.0 (*)    RDW 21.7 (*)    Platelets 114 (*)    Lymphs Abs 0.6 (*)    All other components within normal limits  COMPREHENSIVE METABOLIC PANEL - Abnormal; Notable for the following components:   BUN <5 (*)    Creatinine, Ser 0.39 (*)    Calcium 7.8 (*)    Total Protein 6.3 (*)    Albumin 3.1 (*)    AST 47 (*)    Total Bilirubin 2.0 (*)    All other components within normal limits  LIPASE, BLOOD    EKG None  Radiology Ct Head Wo Contrast  Result Date: 05/14/2018 CLINICAL DATA:  Head trauma status post fall EXAM: CT HEAD WITHOUT CONTRAST CT CERVICAL SPINE WITHOUT CONTRAST TECHNIQUE: Multidetector CT imaging of the head and cervical spine was performed following the standard protocol without intravenous contrast. Multiplanar CT image reconstructions of the cervical spine were also generated. COMPARISON:  None. FINDINGS: CT HEAD FINDINGS Brain: No evidence of acute infarction, hemorrhage, extra-axial collection, ventriculomegaly, or mass effect. Generalized  cerebral atrophy. Periventricular white matter low attenuation likely secondary to microangiopathy. Vascular: Cerebrovascular atherosclerotic calcifications are noted. Skull: Negative for fracture or focal lesion. Sinuses/Orbits: Visualized portions of the orbits are unremarkable. Visualized portions of the paranasal sinuses and mastoid air cells are unremarkable. Other: None. CT CERVICAL SPINE FINDINGS Significant patient motion degrades image quality limiting evaluation. Alignment: Normal. Skull base and vertebrae: No acute fracture. No primary bone lesion or focal pathologic process. Soft tissues and spinal canal: No prevertebral fluid or swelling. No visible canal hematoma. Disc levels:  Disc spaces are preserved. Upper chest: Lung apices are clear. Other: No fluid collection or hematoma. IMPRESSION: 1. No acute intracranial pathology. 2. No acute osseous injury of the cervical spine. Evaluation of the cervical spine is limited secondary to patient motion throughout the examination. Electronically Signed   By: Elige Ko   On: 05/14/2018 18:53   Ct Cervical Spine Wo Contrast  Result Date: 05/14/2018 CLINICAL DATA:  Head trauma status post fall EXAM: CT HEAD WITHOUT CONTRAST CT CERVICAL SPINE WITHOUT CONTRAST TECHNIQUE: Multidetector CT imaging of the head and cervical spine was performed following the standard protocol without intravenous contrast. Multiplanar CT image reconstructions of the cervical spine were also generated. COMPARISON:  None. FINDINGS: CT HEAD FINDINGS Brain: No evidence of acute infarction, hemorrhage, extra-axial collection, ventriculomegaly, or mass effect. Generalized cerebral atrophy. Periventricular white matter low attenuation likely secondary to microangiopathy. Vascular: Cerebrovascular atherosclerotic calcifications are noted. Skull: Negative for fracture or focal lesion. Sinuses/Orbits: Visualized portions of the orbits are unremarkable. Visualized portions of the paranasal  sinuses and mastoid air cells  are unremarkable. Other: None. CT CERVICAL SPINE FINDINGS Significant patient motion degrades image quality limiting evaluation. Alignment: Normal. Skull base and vertebrae: No acute fracture. No primary bone lesion or focal pathologic process. Soft tissues and spinal canal: No prevertebral fluid or swelling. No visible canal hematoma. Disc levels:  Disc spaces are preserved. Upper chest: Lung apices are clear. Other: No fluid collection or hematoma. IMPRESSION: 1. No acute intracranial pathology. 2. No acute osseous injury of the cervical spine. Evaluation of the cervical spine is limited secondary to patient motion throughout the examination. Electronically Signed   By: Elige Ko   On: 05/14/2018 18:53   Dg Knee Complete 4 Views Left  Result Date: 05/14/2018 CLINICAL DATA:  Larey Seat 2 days ago.  Left knee pain. EXAM: LEFT KNEE - COMPLETE 4+ VIEW COMPARISON:  04/14/2018 FINDINGS: No evidence of fracture, dislocation, or joint effusion. No evidence of arthropathy or other focal bone abnormality. Soft tissues are unremarkable. IMPRESSION: Negative. Electronically Signed   By: Paulina Fusi M.D.   On: 05/14/2018 17:15   Dg Hip Unilat With Pelvis 2-3 Views Left  Result Date: 05/14/2018 CLINICAL DATA:  Larey Seat 2 days ago.  Left hip pain. EXAM: DG HIP (WITH OR WITHOUT PELVIS) 2-3V LEFT COMPARISON:  04/09/2018 FINDINGS: There is no evidence of hip fracture or dislocation. There is no evidence of arthropathy or other focal bone abnormality. IMPRESSION: Negative. Electronically Signed   By: Paulina Fusi M.D.   On: 05/14/2018 17:14    Procedures Procedures (including critical care time)  Medications Ordered in ED Medications  ondansetron (ZOFRAN-ODT) disintegrating tablet 4 mg (4 mg Oral Given 05/15/18 0359)  ibuprofen (ADVIL,MOTRIN) tablet 400 mg (400 mg Oral Given 05/15/18 0359)     Initial Impression / Assessment and Plan / ED Course  I have reviewed the triage vital signs and the  nursing notes.  Pertinent labs & imaging results that were available during my care of the patient were reviewed by me and considered in my medical decision making (see chart for details).        Nausea. No vomiting. Also w/ abdominal pain and mulitiple other complaints. Requests discharge with zofran RX prior to workup being completed.   Final Clinical Impressions(s) / ED Diagnoses   Final diagnoses:  Nausea without vomiting    ED Discharge Orders         Ordered    ondansetron (ZOFRAN) 4 MG tablet  Every 8 hours PRN     05/15/18 0426           Hideo Googe, Barbara Cower, MD 05/15/18 1610

## 2018-05-15 NOTE — ED Notes (Signed)
After hours [facility] called

## 2018-05-15 NOTE — ED Triage Notes (Signed)
Pt walking to restroom AO x4 Patient has feces in pants, given cloths to clean self up

## 2018-05-15 NOTE — ED Notes (Signed)
[facility] is unable to transport patient home due to not being approved by their boss

## 2018-05-15 NOTE — ED Notes (Signed)
Pt refusing to leave oxygen on, patient states she needs to leave. Patient was explained she has been discharged and is free to leave at any point, but that [facility] has been called for her.

## 2018-05-19 ENCOUNTER — Emergency Department (HOSPITAL_COMMUNITY)
Admission: EM | Admit: 2018-05-19 | Discharge: 2018-05-19 | Disposition: A | Discharge status: Home / Self Care | Payer: Self-pay | Attending: Emergency Medicine | Admitting: Emergency Medicine

## 2018-05-19 DIAGNOSIS — F17210 Nicotine dependence, cigarettes, uncomplicated: Secondary | ICD-10-CM | POA: Insufficient documentation

## 2018-05-19 DIAGNOSIS — J449 Chronic obstructive pulmonary disease, unspecified: Secondary | ICD-10-CM | POA: Insufficient documentation

## 2018-05-19 DIAGNOSIS — Z8669 Personal history of other diseases of the nervous system and sense organs: Secondary | ICD-10-CM

## 2018-05-19 DIAGNOSIS — F909 Attention-deficit hyperactivity disorder, unspecified type: Secondary | ICD-10-CM | POA: Insufficient documentation

## 2018-05-19 DIAGNOSIS — R251 Tremor, unspecified: Secondary | ICD-10-CM | POA: Insufficient documentation

## 2018-05-19 DIAGNOSIS — G35 Multiple sclerosis: Secondary | ICD-10-CM

## 2018-05-19 DIAGNOSIS — R110 Nausea: Secondary | ICD-10-CM | POA: Insufficient documentation

## 2018-05-19 DIAGNOSIS — Z79899 Other long term (current) drug therapy: Secondary | ICD-10-CM | POA: Insufficient documentation

## 2018-05-19 MED ORDER — GABAPENTIN 100 MG PO CAPS: 200.0000 mg | ORAL_CAPSULE | Freq: Three times a day (TID) | ORAL | 0 refills | Status: DC

## 2018-05-19 MED ORDER — CHLORDIAZEPOXIDE HCL 25 MG PO CAPS: ORAL_CAPSULE | ORAL | 0 refills | Status: DC

## 2018-05-19 MED ORDER — LORAZEPAM 2 MG/ML IJ SOLN
1.0000 mg | Freq: Once | INTRAMUSCULAR | Status: AC
  Administered 2018-05-19: 1 mg via INTRAVENOUS
  Filled 2018-05-19: qty 1

## 2018-05-19 MED ORDER — GABAPENTIN 100 MG PO CAPS
200.0000 mg | ORAL_CAPSULE | Freq: Once | ORAL | Status: AC
  Administered 2018-05-19: 200 mg via ORAL
  Filled 2018-05-19: qty 2

## 2018-05-19 MED ORDER — ONDANSETRON 8 MG PO TBDP: 8.0000 mg | ORAL_TABLET | Freq: Three times a day (TID) | ORAL | 0 refills | Status: DC | PRN

## 2018-05-19 MED ORDER — RIZATRIPTAN BENZOATE 5 MG PO TABS: 5.0000 mg | ORAL_TABLET | ORAL | 0 refills | Status: DC | PRN

## 2018-05-19 MED ORDER — ONDANSETRON HCL 4 MG/2ML IJ SOLN
4.0000 mg | Freq: Once | INTRAMUSCULAR | Status: AC
  Administered 2018-05-19: 4 mg via INTRAVENOUS
  Filled 2018-05-19: qty 2

## 2018-05-19 NOTE — ED Triage Notes (Signed)
Transported by [facility] from home-- MS flare up. +nausea, generalized abdominal pain/distension.

## 2018-05-19 NOTE — ED Notes (Signed)
Bed: WA21 Expected date:  Expected time:  Means of arrival:  Comments: 58 yo MS flare

## 2018-05-19 NOTE — ED Provider Notes (Signed)
[facility]-EMERGENCY DEPT Provider Note   CSN: [id number] Arrival date & time: 05/19/18  1000    History   Chief Complaint Chief Complaint  Patient presents with  . Multiple Sclerosis    HPI Anne Black is a 58 y.o. female.     58 year old female with history of chronic medical problems consisting of alcoholism as well as multiple ED visits presents with various complaints.  Did consist of migraine headache similar to her prior as well as tremors.  Patient drinks alcohol daily last drink was over 24 hours ago.  Denies any hallucinations.  Notes crampy abdominal pain with fever or emesis.  Some nausea noted.  Patient does have a history of multiple sclerosis but denies any bowel or bladder dysfunction or any new severe ataxia.  Notes diffuse pain at this time and is not taking any medications     Past Medical History:  Diagnosis Date  . ADHD (attention deficit hyperactivity disorder)   . Alcoholic cirrhosis (HCC)    with ascites  . Alcoholism (HCC)   . Allergies   . Anxiety   . Arthritis   . Atrial fibrillation with RVR (HCC)   . Bipolar disorder (HCC)   . COPD (chronic obstructive pulmonary disease) (HCC)    wears 2L chronic O2  . Dyspnea   . EtOH dependence (HCC) 05/14/2018  . GERD (gastroesophageal reflux disease)   . History of hiatal hernia   . Hypokalemia   . Migraine   . Multiple sclerosis (HCC)   . Narcolepsy   . Osteoarthritis of knee   . Osteoporosis   . Pneumonia     Patient Active Problem List   Diagnosis Date Noted  . Alcohol dependence (HCC) 05/06/2018  . Electrolyte disturbance 05/05/2018  . Alcohol withdrawal delirium (HCC) 04/28/2018  . Weakness generalized 04/27/2018  . Gait abnormality 04/24/2018  . Chronic hypoxemic respiratory failure (HCC) 04/11/2018  . Atrial fibrillation with rapid ventricular response (HCC) 03/23/2018  . Head injury   . Fall 03/19/2018  . Syncope 03/19/2018  . Orbital fracture 03/19/2018   . Scalp laceration 03/19/2018  . Acute respiratory failure with hypoxia and hypercarbia (HCC) 03/19/2018  . Abdominal tenderness 03/19/2018  . Acute on chronic respiratory failure with hypoxia (HCC) 03/17/2018  . Alcohol abuse with intoxication (HCC) 03/17/2018  . ETOH abuse   . Normocytic anemia 03/16/2018  . Acute metabolic encephalopathy 03/16/2018  . Lower leg edema 03/16/2018  . Migraine 03/16/2018  . Community acquired pneumonia of left lower lobe of lung (HCC)   . Weakness 08/21/2017  . COPD exacerbation (HCC) 06/12/2017  . Chest pain with moderate risk for cardiac etiology 05/30/2017  . Abnormal LFTs 05/30/2017  . Tachycardia 05/30/2017  . COPD (chronic obstructive pulmonary disease) (HCC) 04/19/2017  . Right rib fracture 04/15/2017  . Abdominal wall cellulitis 12/30/2016  . S/P exploratory laparotomy 12/30/2016  . Distal radius fracture, left 09/09/2016  . Thrombocytopenia (HCC) 09/09/2016  . Chest x-ray abnormality   . Umbilical hernia without obstruction and without gangrene 05/12/2016  . Itching 02/24/2016  . Ventral hernia without obstruction or gangrene 02/24/2016  . Palliative care encounter   . Ascites   . Tachypnea   . Atrial flutter with rapid ventricular response (HCC)   . Hypomagnesemia   . Hypoxia   . Dehydration 01/22/2016  . Low back ache 12/30/2015  . Non-intractable vomiting with nausea   . Alcohol abuse with alcohol-induced mood disorder (HCC) 12/15/2015  . Acute alcoholism (HCC)   .  Abdominal distension 11/04/2015  . Alcohol abuse   . Dyspnea   . Acute respiratory failure (HCC) 10/15/2015  . Ascites due to alcoholic cirrhosis (HCC) 10/15/2015  . Osteoporosis   . Multiple sclerosis (HCC)   . Alcoholic cirrhosis of liver with ascites (HCC) 08/22/2015  . Bipolar disorder, current episode mixed, moderate (HCC) 08/01/2015  . Alcohol use disorder, severe, dependence (HCC) 07/31/2015  . Macrocytic anemia- due to alcohol abuse with normal B12 &  folate levels 05/31/2015  . Severe protein-calorie malnutrition (HCC) 05/31/2015  . Hypokalemia 05/26/2015  . Encephalopathy, hepatic (HCC) 05/26/2015  . Alcohol withdrawal (HCC) 05/18/2015  . Abdominal pain 05/18/2015  . Anxiety 05/18/2015  . Stimulant abuse (HCC) 12/27/2014  . Nicotine abuse 12/27/2014  . Hyperprolactinemia (HCC) 11/15/2014  . Dyslipidemia   . Alcohol use disorder, moderate, dependence (HCC) 10/30/2014  . Tobacco abuse 01/23/2014  . Bipolar 1 disorder (HCC) 04/07/2013  . Noncompliance with therapeutic plan 04/04/2013  . Hereditary and idiopathic peripheral neuropathy 05/01/2012  . Depression 05/01/2012  . GERD (gastroesophageal reflux disease) 05/01/2012  . Osteoarthrosis, unspecified whether generalized or localized, involving lower leg 05/01/2012  . Hyponatremia 07/01/2011    Past Surgical History:  Procedure Laterality Date  . ABDOMINAL WALL DEFECT REPAIR N/A 12/30/2016   Procedure: EXPLORATORY LAPAROTOMY WITH REPAIR ABDOMINAL WALL VENTRAL HERNIA;  Surgeon: [name], MD;  Location: [facility] OR;  Service: General;  Laterality: N/A;  . APPLICATION OF WOUND VAC N/A 12/30/2016   Procedure: APPLICATION OF WOUND VAC;  Surgeon: [name], MD;  Location: [facility] OR;  Service: General;  Laterality: N/A;  . CESAREAN SECTION  [phone]  . FRACTURE SURGERY    . HERNIA REPAIR    . MYRINGOTOMY WITH TUBE PLACEMENT Bilateral   . ORIF WRIST FRACTURE Left 09/09/2016   Procedure: OPEN REDUCTION INTERNAL FIXATION (ORIF) LEFT WRIST FRACTURE, LEFT CARPAL TUNNEL RELEASE;  Surgeon: Dominica Severin, MD;  Location: [facility] ORS;  Service: Orthopedics;  Laterality: Left;  . TONSILLECTOMY       OB History   No obstetric history on file.      Home Medications    Prior to Admission medications   Medication Sig Start Date End Date Taking? Authorizing Provider  acetaminophen (TYLENOL) 500 MG tablet Take 1 tablet (500 mg total) by mouth every 6 (six) hours as needed for mild pain.  Patient taking differently: Take 1,000 mg by mouth every 6 (six) hours as needed for mild pain.  03/28/18   Rolly Salter, MD  albuterol (PROVENTIL HFA;VENTOLIN HFA) 108 (90 Base) MCG/ACT inhaler Inhale 2 puffs into the lungs every 4 (four) hours as needed for wheezing or shortness of breath.    [provider]  aluminum-magnesium hydroxide-simethicone (MAALOX) 200-200-20 MG/5ML SUSP Take 30 mLs by mouth 4 (four) times daily as needed (indigestion).     [provider]  chlordiazePOXIDE (LIBRIUM) 25 MG capsule 25mg  PO TID x 1D, then 25mg  PO BID X 1D, then 25mg  PO QD X 1D 05/09/18   Petrucelli, Samantha R, PA-C  diltiazem (CARDIZEM CD) 120 MG 24 hr capsule Take 1 capsule (120 mg total) by mouth daily for 30 days. 04/14/18 05/14/18  Tegeler, Canary Brim, MD  Fluticasone-Salmeterol [facility] INHUB) 250-50 MCG/DOSE AEPB Inhale 2 puffs into the lungs 2 (two) times daily.     [provider]  furosemide (LASIX) 40 MG tablet Take 40 mg twice daily till 04/02/2018, after that take 40 mg daily. Patient taking differently: Take 40 mg by mouth daily. Take 40 mg  twice daily till 04/02/2018, after that take 40 mg daily. 04/14/18   Tegeler, Canary Brim, MD  gabapentin (NEURONTIN) 100 MG capsule Take 2 capsules (200 mg total) by mouth 3 (three) times daily for 30 days. 05/03/18 06/02/18  Henderly, Britni A, PA-C  hydrOXYzine (ATARAX/VISTARIL) 25 MG tablet Take 1 tablet (25 mg total) by mouth daily as needed for up to 30 doses (anxiety). Patient taking differently: Take 25 mg by mouth daily as needed for anxiety (withdrawal).  04/14/18   Tegeler, Canary Brim, MD  ipratropium-albuterol (DUONEB) 0.5-2.5 (3) MG/3ML SOLN Take 3 mLs by nebulization 2 (two) times daily. AND every 4 hours as needed for wheezing/shortness of breath    [provider]  lidocaine (LIDODERM) 5 % Place 1 patch onto the skin daily. Remove & Discard patch within 12 hours or as directed by MD 05/03/18   Henderly, Britni  A, PA-C  magnesium oxide (MAG-OX) 400 MG tablet Take 400 mg by mouth daily.    [provider]  ondansetron (ZOFRAN) 4 MG tablet Take 1 tablet (4 mg total) by mouth every 8 (eight) hours as needed for nausea or vomiting. 05/15/18   Mesner, Barbara Cower, MD  pantoprazole (PROTONIX) 40 MG tablet Take 1 tablet (40 mg total) by mouth daily for 30 days. 04/14/18 05/14/18  Tegeler, Canary Brim, MD  potassium chloride SA (K-DUR,KLOR-CON) 20 MEQ tablet Take 2 tablets (40 mEq total) by mouth 2 (two) times daily. 04/19/18   Bethel Born, PA-C  rizatriptan (MAXALT) 10 MG tablet Take 10 mg by mouth as needed for migraine. May repeat in 2 hours if needed    [provider]  rizatriptan (MAXALT) 5 MG tablet Take 5 mg by mouth as needed for migraine. May repeat in 2 hours if needed; not to exceed  in 24 hours    [provider]  spironolactone (ALDACTONE) 25 MG tablet Take 1 tablet (25 mg total) by mouth daily for 30 days. 04/14/18 05/14/18  Tegeler, Canary Brim, MD    Family History Family History  Problem Relation Age of Onset  . Arrhythmia Mother   . Neuropathy Mother   . Coronary artery disease Mother   . Heart disease Mother   . Heart disease Father   . Hypertension Father   . Heart attack Father   . Multiple sclerosis Maternal Aunt     Social History Social History   Tobacco Use  . Smoking status: Current Every Day Smoker    Packs/day: 1.00    Years: 36.00    Pack years: 36.00    Types: Cigarettes  . Smokeless tobacco: Never Used  Substance Use Topics  . Alcohol use: Yes    Comment: Chronic ETOH abuse  . Drug use: No     Allergies   Patient has no known allergies.   Review of Systems Review of Systems  All other systems reviewed and are negative.    Physical Exam Updated Vital Signs BP (!) 142/56 (BP Location: Right Arm)   Pulse 90   Temp 98.1 F (36.7 C) (Oral)   Resp 15   Ht 1.575 m ( )   SpO2 97%   BMI 31.05 kg/m   Physical Exam  Vitals signs and nursing note reviewed.  Constitutional:      General: She is not in acute distress.    Appearance: Normal appearance. She is well-developed. She is not toxic-appearing.  HENT:     Head: Normocephalic and atraumatic.  Eyes:     General:  Lids are normal.     Conjunctiva/sclera: Conjunctivae normal.     Pupils: Pupils are equal, round, and reactive to light.  Neck:     Musculoskeletal: Normal range of motion and neck supple.     Thyroid: No thyroid mass.     Trachea: No tracheal deviation.  Cardiovascular:     Rate and Rhythm: Normal rate and regular rhythm.     Heart sounds: Normal heart sounds. No murmur. No gallop.   Pulmonary:     Effort: Pulmonary effort is normal. No respiratory distress.     Breath sounds: Normal breath sounds. No stridor. No decreased breath sounds, wheezing, rhonchi or rales.  Abdominal:     General: Bowel sounds are normal. There is no distension.     Palpations: Abdomen is soft.     Tenderness: There is no abdominal tenderness. There is no rebound.  Musculoskeletal: Normal range of motion.        General: No tenderness.  Skin:    General: Skin is warm and dry.     Findings: No abrasion or rash.  Neurological:     Mental Status: She is alert and oriented to person, place, and time.     GCS: GCS eye subscore is 4. GCS verbal subscore is 5. GCS motor subscore is 6.     Cranial Nerves: No cranial nerve deficit.     Sensory: No sensory deficit.  Psychiatric:        Speech: Speech normal.        Behavior: Behavior normal.      ED Treatments / Results  Labs (all labs ordered are listed, but only abnormal results are displayed) Labs Reviewed - No data to display  EKG None  Radiology No results found.  Procedures Procedures (including critical care time)  Medications Ordered in ED Medications  ondansetron (ZOFRAN) injection 4 mg (has no administration in time range)  LORazepam (ATIVAN) injection 1 mg (has no administration in  time range)  gabapentin (NEURONTIN) capsule 200 mg (has no administration in time range)     Initial Impression / Assessment and Plan / ED Course  I have reviewed the triage vital signs and the nursing notes.  Pertinent labs & imaging results that were available during my care of the patient were reviewed by me and considered in my medical decision making (see chart for details).        Patient has no indication for labs at this time.  Patient has history of multiple ED visits for various complaints.  Patient was given meds here for nausea, Ativan for tremors as well as Neurontin for her chronic pain.  Encouraged to follow-up with her doctor  Final Clinical Impressions(s) / ED Diagnoses   Final diagnoses:  None    ED Discharge Orders    None       Lorre Nick, MD 05/19/18 1035

## 2018-05-19 NOTE — ED Notes (Signed)
ED Provider at bedside. 

## 2018-05-30 ENCOUNTER — Other Ambulatory Visit: Payer: Self-pay

## 2018-05-30 ENCOUNTER — Emergency Department (HOSPITAL_COMMUNITY)
Admission: EM | Admit: 2018-05-30 | Discharge: 2018-05-30 | Discharge status: Against Medical Advice | Payer: Self-pay | Attending: Emergency Medicine | Admitting: Emergency Medicine

## 2018-05-30 ENCOUNTER — Emergency Department (HOSPITAL_COMMUNITY): Payer: Self-pay

## 2018-05-30 ENCOUNTER — Encounter (HOSPITAL_COMMUNITY): Payer: Self-pay

## 2018-05-30 DIAGNOSIS — J449 Chronic obstructive pulmonary disease, unspecified: Secondary | ICD-10-CM | POA: Insufficient documentation

## 2018-05-30 DIAGNOSIS — F319 Bipolar disorder, unspecified: Secondary | ICD-10-CM | POA: Insufficient documentation

## 2018-05-30 DIAGNOSIS — Z5320 Procedure and treatment not carried out because of patient's decision for unspecified reasons: Secondary | ICD-10-CM | POA: Insufficient documentation

## 2018-05-30 DIAGNOSIS — R1084 Generalized abdominal pain: Secondary | ICD-10-CM | POA: Insufficient documentation

## 2018-05-30 DIAGNOSIS — Z79899 Other long term (current) drug therapy: Secondary | ICD-10-CM | POA: Insufficient documentation

## 2018-05-30 DIAGNOSIS — I4891 Unspecified atrial fibrillation: Secondary | ICD-10-CM | POA: Insufficient documentation

## 2018-05-30 DIAGNOSIS — R197 Diarrhea, unspecified: Secondary | ICD-10-CM | POA: Insufficient documentation

## 2018-05-30 DIAGNOSIS — K7031 Alcoholic cirrhosis of liver with ascites: Secondary | ICD-10-CM | POA: Insufficient documentation

## 2018-05-30 DIAGNOSIS — R609 Edema, unspecified: Secondary | ICD-10-CM | POA: Insufficient documentation

## 2018-05-30 DIAGNOSIS — F17210 Nicotine dependence, cigarettes, uncomplicated: Secondary | ICD-10-CM | POA: Insufficient documentation

## 2018-05-30 DIAGNOSIS — G35 Multiple sclerosis: Secondary | ICD-10-CM | POA: Insufficient documentation

## 2018-05-30 DIAGNOSIS — R0602 Shortness of breath: Secondary | ICD-10-CM | POA: Insufficient documentation

## 2018-05-30 LAB — CBC WITH DIFFERENTIAL/PLATELET
Abs Immature Granulocytes: 0 10*3/uL (ref 0.00–0.07)
Basophils Absolute: 0 10*3/uL (ref 0.0–0.1)
Basophils Relative: 1 %
Eosinophils Absolute: 0 10*3/uL (ref 0.0–0.5)
Eosinophils Relative: 1 %
HCT: 31.5 % — ABNORMAL LOW (ref 36.0–46.0)
Hemoglobin: 9 g/dL — ABNORMAL LOW (ref 12.0–15.0)
Lymphocytes Relative: 8 %
Lymphs Abs: 0.3 10*3/uL — ABNORMAL LOW (ref 0.7–4.0)
MCH: 22.8 pg — ABNORMAL LOW (ref 26.0–34.0)
MCHC: 28.6 g/dL — ABNORMAL LOW (ref 30.0–36.0)
MCV: 79.9 fL — ABNORMAL LOW (ref 80.0–100.0)
Monocytes Absolute: 0.4 10*3/uL (ref 0.1–1.0)
Monocytes Relative: 10 %
Neutro Abs: 3 10*3/uL (ref 1.7–7.7)
Neutrophils Relative %: 80 %
Platelets: 98 10*3/uL — ABNORMAL LOW (ref 150–400)
RBC: 3.94 MIL/uL (ref 3.87–5.11)
RDW: 24.3 % — ABNORMAL HIGH (ref 11.5–15.5)
WBC: 3.8 10*3/uL — ABNORMAL LOW (ref 4.0–10.5)
nRBC: 0 % (ref 0.0–0.2)

## 2018-05-30 LAB — URINALYSIS, ROUTINE W REFLEX MICROSCOPIC
Bilirubin Urine: NEGATIVE
Glucose, UA: NEGATIVE mg/dL
Hgb urine dipstick: NEGATIVE
Ketones, ur: NEGATIVE mg/dL
Leukocytes,Ua: NEGATIVE
Nitrite: NEGATIVE
Protein, ur: NEGATIVE mg/dL
Specific Gravity, Urine: 1.012 (ref 1.005–1.030)
pH: 5 (ref 5.0–8.0)

## 2018-05-30 LAB — MAGNESIUM: Magnesium: 1.8 mg/dL (ref 1.7–2.4)

## 2018-05-30 LAB — COMPREHENSIVE METABOLIC PANEL
ALT: 29 U/L (ref 0–44)
AST: 69 U/L — ABNORMAL HIGH (ref 15–41)
Albumin: 2.6 g/dL — ABNORMAL LOW (ref 3.5–5.0)
Alkaline Phosphatase: 159 U/L — ABNORMAL HIGH (ref 38–126)
Anion gap: 12 (ref 5–15)
BUN: <5 mg/dL — ABNORMAL LOW (ref 6–20)
CO2: 27 mmol/L (ref 22–32)
Calcium: 7.8 mg/dL — ABNORMAL LOW (ref 8.9–10.3)
Chloride: 98 mmol/L (ref 98–111)
Creatinine, Ser: 0.43 mg/dL — ABNORMAL LOW (ref 0.44–1.00)
GFR calc Af Amer: >60 mL/min (ref >60)
GFR calc non Af Amer: >60 mL/min (ref >60)
Glucose, Bld: 92 mg/dL (ref 70–99)
Potassium: 3.5 mmol/L (ref 3.5–5.1)
Sodium: 137 mmol/L (ref 135–145)
Total Bilirubin: 4.4 mg/dL — ABNORMAL HIGH (ref 0.3–1.2)
Total Protein: 6.4 g/dL — ABNORMAL LOW (ref 6.5–8.1)

## 2018-05-30 LAB — TROPONIN I: Troponin I: <0.03 ng/mL (ref <0.03)

## 2018-05-30 LAB — LIPASE, BLOOD: Lipase: 30 U/L (ref 11–51)

## 2018-05-30 LAB — BRAIN NATRIURETIC PEPTIDE: B Natriuretic Peptide: 60.2 pg/mL (ref 0.0–100.0)

## 2018-05-30 LAB — ETHANOL: Alcohol, Ethyl (B): 96 mg/dL — ABNORMAL HIGH (ref <10)

## 2018-05-30 MED ORDER — FENTANYL CITRATE (PF) 100 MCG/2ML IJ SOLN
50.0000 ug | Freq: Once | INTRAMUSCULAR | Status: AC
  Administered 2018-05-30: 50 ug via INTRAVENOUS
  Filled 2018-05-30: qty 2

## 2018-05-30 MED ORDER — METOCLOPRAMIDE HCL 5 MG/ML IJ SOLN
10.0000 mg | Freq: Once | INTRAMUSCULAR | Status: AC
  Administered 2018-05-30: 10 mg via INTRAVENOUS
  Filled 2018-05-30: qty 2

## 2018-05-30 NOTE — ED Notes (Signed)
Urine Culture collected and sent to main lab. 

## 2018-05-30 NOTE — ED Notes (Signed)
Pt states she wishes to leave. Ask for wc.

## 2018-05-30 NOTE — ED Triage Notes (Signed)
Pt c/o abdominal pain and distension with shortness of breath over 2 days. EMS states tympasnic temp of 99.9. Pt staates diarrhea yesterday x 1 . Zofran 4 mg IM PTA.

## 2018-05-30 NOTE — ED Notes (Signed)
ED Provider at bedside. 

## 2018-05-30 NOTE — ED Notes (Signed)
Pt states she fell out of bed but denies injury. Provider aware.

## 2018-05-30 NOTE — ED Notes (Signed)
Linear marks x 3 noted at right flank of unknown age. Pt informed of need for urine.

## 2018-05-30 NOTE — ED Notes (Signed)
Pt states, "I don't know why you are asking all these questions." responds to questions in irritable fashion.

## 2018-05-30 NOTE — TOC Initial Note (Signed)
Transition of Care [facility]) - Initial/Assessment Note    Patient Details  Name: Anne Black MRN: [id number] Date of Birth: Jun 09, 1960  [facility] CM/SW Contact:    Oletta Cohn, RN Phone Number: 05/30/2018, 8:45 AM  Clinical Narrative:                 [facility] following for disposition needs.  Pt has been declined by multiple HH agencies due to safety of HH caregivers.  Pt has been known to deny entry in to her home,  threaten to harm staff and home is unkept.    Expected Discharge Plan: Home/Self Care Barriers to Discharge: Transportation, Continued Medical Work up   Patient Goals and CMS Choice Patient states their goals for this hospitalization and ongoing recovery are:: make my stomach stop hurting      Expected Discharge Plan and Services Expected Discharge Plan: Home/Self Care   Discharge Planning Services: CM Consult   Living arrangements for the past 2 months: Single Family Home                          Prior Living Arrangements/Services Living arrangements for the past 2 months: Single Family Home Lives with:: Self Patient language and need for interpreter reviewed:: Yes Do you feel safe going back to the place where you live?: Yes            Criminal Activity/Legal Involvement Pertinent to Current Situation/Hospitalization: No - Comment as needed  Activities of Daily Living      Permission Sought/Granted Permission sought to share information with : Case Manager, Family Supports, Oceanographer granted to share information with : Yes, Verbal Permission Granted              Emotional Assessment Appearance:: Disheveled Attitude/Demeanor/Rapport: Apprehensive Affect (typically observed): Frustrated, Guarded, Irritable Orientation: : Oriented to Self, Oriented to Place, Oriented to  Time, Oriented to Situation Alcohol / Substance Use: Alcohol Use, Tobacco Use Psych Involvement: Yes (comment)(Bipolar,  Depression)  Admission diagnosis:  abd pain Patient Active Problem List   Diagnosis Date Noted  . Alcohol dependence (HCC) 05/06/2018  . Electrolyte disturbance 05/05/2018  . Alcohol withdrawal delirium (HCC) 04/28/2018  . Weakness generalized 04/27/2018  . Gait abnormality 04/24/2018  . Chronic hypoxemic respiratory failure (HCC) 04/11/2018  . Atrial fibrillation with rapid ventricular response (HCC) 03/23/2018  . Head injury   . Fall 03/19/2018  . Syncope 03/19/2018  . Orbital fracture 03/19/2018  . Scalp laceration 03/19/2018  . Acute respiratory failure with hypoxia and hypercarbia (HCC) 03/19/2018  . Abdominal tenderness 03/19/2018  . Acute on chronic respiratory failure with hypoxia (HCC) 03/17/2018  . Alcohol abuse with intoxication (HCC) 03/17/2018  . ETOH abuse   . Normocytic anemia 03/16/2018  . Acute metabolic encephalopathy 03/16/2018  . Lower leg edema 03/16/2018  . Migraine 03/16/2018  . Community acquired pneumonia of left lower lobe of lung (HCC)   . Weakness 08/21/2017  . COPD exacerbation (HCC) 06/12/2017  . Chest pain with moderate risk for cardiac etiology 05/30/2017  . Abnormal LFTs 05/30/2017  . Tachycardia 05/30/2017  . COPD (chronic obstructive pulmonary disease) (HCC) 04/19/2017  . Right rib fracture 04/15/2017  . Abdominal wall cellulitis 12/30/2016  . S/P exploratory laparotomy 12/30/2016  . Distal radius fracture, left 09/09/2016  . Thrombocytopenia (HCC) 09/09/2016  . Chest x-ray abnormality   . Umbilical hernia without obstruction and without gangrene 05/12/2016  . Itching 02/24/2016  . Ventral  hernia without obstruction or gangrene 02/24/2016  . Palliative care encounter   . Ascites   . Tachypnea   . Atrial flutter with rapid ventricular response (HCC)   . Hypomagnesemia   . Hypoxia   . Dehydration 01/22/2016  . Low back ache 12/30/2015  . Non-intractable vomiting with nausea   . Alcohol abuse with alcohol-induced mood disorder (HCC)  12/15/2015  . Acute alcoholism (HCC)   . Abdominal distension 11/04/2015  . Alcohol abuse   . Dyspnea   . Acute respiratory failure (HCC) 10/15/2015  . Ascites due to alcoholic cirrhosis (HCC) 10/15/2015  . Osteoporosis   . Multiple sclerosis (HCC)   . Alcoholic cirrhosis of liver with ascites (HCC) 08/22/2015  . Bipolar disorder, current episode mixed, moderate (HCC) 08/01/2015  . Alcohol use disorder, severe, dependence (HCC) 07/31/2015  . Macrocytic anemia- due to alcohol abuse with normal B12 & folate levels 05/31/2015  . Severe protein-calorie malnutrition (HCC) 05/31/2015  . Hypokalemia 05/26/2015  . Encephalopathy, hepatic (HCC) 05/26/2015  . Alcohol withdrawal (HCC) 05/18/2015  . Abdominal pain 05/18/2015  . Anxiety 05/18/2015  . Stimulant abuse (HCC) 12/27/2014  . Nicotine abuse 12/27/2014  . Hyperprolactinemia (HCC) 11/15/2014  . Dyslipidemia   . Alcohol use disorder, moderate, dependence (HCC) 10/30/2014  . Tobacco abuse 01/23/2014  . Bipolar 1 disorder (HCC) 04/07/2013  . Noncompliance with therapeutic plan 04/04/2013  . Hereditary and idiopathic peripheral neuropathy 05/01/2012  . Depression 05/01/2012  . GERD (gastroesophageal reflux disease) 05/01/2012  . Osteoarthrosis, unspecified whether generalized or localized, involving lower leg 05/01/2012  . Hyponatremia 07/01/2011   PCP:  Patient, No Pcp Per Pharmacy:   [id number] - [place], [place] - [address] [place] [address] Phone: [phone] Fax: [phone]     Social Determinants of Health (SDOH) Interventions    Readmission Risk Interventions No flowsheet data found.

## 2018-05-30 NOTE — ED Notes (Signed)
Pt standing art bedside. Refuses further vital signs.

## 2018-05-30 NOTE — ED Provider Notes (Signed)
[facility] EMERGENCY DEPARTMENT Provider Note   CSN: [id number] Arrival date & time: 05/30/18  4098    History   Chief Complaint Chief Complaint  Patient presents with  . Abdominal Pain  . Shortness of Breath    HPI Anne Black is a 58 y.o. female with history of alcoholism, alcoholic cirrhosis with ascites, atrial fibrillation not anticoagulated, COPD, MS who presents with abdominal pain and distention and shortness of breath.  Patient does not know how long this is been going on.  She refuses to quantify or qualify the pain.  She reports that shortness of breath is worse than normal.  She is on 2 L of oxygen at home as needed.  She does not know when she last used the oxygen at home.  She has had some diarrhea, nonbloody, for the past day.  She reports she fell out of bed this morning.  She did not hit her head or lose consciousness, but does have some pain in her right lower abdomen since.  She denies any neck or back pain, urinary symptoms.  She has had some lower extremity edema, bilaterally, that she thinks has been since this past month.      HPI  Past Medical History:  Diagnosis Date  . ADHD (attention deficit hyperactivity disorder)   . Alcoholic cirrhosis (HCC)    with ascites  . Alcoholism (HCC)   . Allergies   . Anxiety   . Arthritis   . Atrial fibrillation with RVR (HCC)   . Bipolar disorder (HCC)   . COPD (chronic obstructive pulmonary disease) (HCC)    wears 2L chronic O2  . Dyspnea   . EtOH dependence (HCC) 05/14/2018  . GERD (gastroesophageal reflux disease)   . History of hiatal hernia   . Hypokalemia   . Migraine   . Multiple sclerosis (HCC)   . Narcolepsy   . Osteoarthritis of knee   . Osteoporosis   . Pneumonia     Patient Active Problem List   Diagnosis Date Noted  . Alcohol dependence (HCC) 05/06/2018  . Electrolyte disturbance 05/05/2018  . Alcohol withdrawal delirium (HCC) 04/28/2018  . Weakness generalized  04/27/2018  . Gait abnormality 04/24/2018  . Chronic hypoxemic respiratory failure (HCC) 04/11/2018  . Atrial fibrillation with rapid ventricular response (HCC) 03/23/2018  . Head injury   . Fall 03/19/2018  . Syncope 03/19/2018  . Orbital fracture 03/19/2018  . Scalp laceration 03/19/2018  . Acute respiratory failure with hypoxia and hypercarbia (HCC) 03/19/2018  . Abdominal tenderness 03/19/2018  . Acute on chronic respiratory failure with hypoxia (HCC) 03/17/2018  . Alcohol abuse with intoxication (HCC) 03/17/2018  . ETOH abuse   . Normocytic anemia 03/16/2018  . Acute metabolic encephalopathy 03/16/2018  . Lower leg edema 03/16/2018  . Migraine 03/16/2018  . Community acquired pneumonia of left lower lobe of lung (HCC)   . Weakness 08/21/2017  . COPD exacerbation (HCC) 06/12/2017  . Chest pain with moderate risk for cardiac etiology 05/30/2017  . Abnormal LFTs 05/30/2017  . Tachycardia 05/30/2017  . COPD (chronic obstructive pulmonary disease) (HCC) 04/19/2017  . Right rib fracture 04/15/2017  . Abdominal wall cellulitis 12/30/2016  . S/P exploratory laparotomy 12/30/2016  . Distal radius fracture, left 09/09/2016  . Thrombocytopenia (HCC) 09/09/2016  . Chest x-ray abnormality   . Umbilical hernia without obstruction and without gangrene 05/12/2016  . Itching 02/24/2016  . Ventral hernia without obstruction or gangrene 02/24/2016  . Palliative care encounter   .  Ascites   . Tachypnea   . Atrial flutter with rapid ventricular response (HCC)   . Hypomagnesemia   . Hypoxia   . Dehydration 01/22/2016  . Low back ache 12/30/2015  . Non-intractable vomiting with nausea   . Alcohol abuse with alcohol-induced mood disorder (HCC) 12/15/2015  . Acute alcoholism (HCC)   . Abdominal distension 11/04/2015  . Alcohol abuse   . Dyspnea   . Acute respiratory failure (HCC) 10/15/2015  . Ascites due to alcoholic cirrhosis (HCC) 10/15/2015  . Osteoporosis   . Multiple sclerosis  (HCC)   . Alcoholic cirrhosis of liver with ascites (HCC) 08/22/2015  . Bipolar disorder, current episode mixed, moderate (HCC) 08/01/2015  . Alcohol use disorder, severe, dependence (HCC) 07/31/2015  . Macrocytic anemia- due to alcohol abuse with normal B12 & folate levels 05/31/2015  . Severe protein-calorie malnutrition (HCC) 05/31/2015  . Hypokalemia 05/26/2015  . Encephalopathy, hepatic (HCC) 05/26/2015  . Alcohol withdrawal (HCC) 05/18/2015  . Abdominal pain 05/18/2015  . Anxiety 05/18/2015  . Stimulant abuse (HCC) 12/27/2014  . Nicotine abuse 12/27/2014  . Hyperprolactinemia (HCC) 11/15/2014  . Dyslipidemia   . Alcohol use disorder, moderate, dependence (HCC) 10/30/2014  . Tobacco abuse 01/23/2014  . Bipolar 1 disorder (HCC) 04/07/2013  . Noncompliance with therapeutic plan 04/04/2013  . Hereditary and idiopathic peripheral neuropathy 05/01/2012  . Depression 05/01/2012  . GERD (gastroesophageal reflux disease) 05/01/2012  . Osteoarthrosis, unspecified whether generalized or localized, involving lower leg 05/01/2012  . Hyponatremia 07/01/2011    Past Surgical History:  Procedure Laterality Date  . ABDOMINAL WALL DEFECT REPAIR N/A 12/30/2016   Procedure: EXPLORATORY LAPAROTOMY WITH REPAIR ABDOMINAL WALL VENTRAL HERNIA;  Surgeon: [name], MD;  Location: [facility] OR;  Service: General;  Laterality: N/A;  . APPLICATION OF WOUND VAC N/A 12/30/2016   Procedure: APPLICATION OF WOUND VAC;  Surgeon: [name], MD;  Location: [facility] OR;  Service: General;  Laterality: N/A;  . CESAREAN SECTION  [phone]  . FRACTURE SURGERY    . HERNIA REPAIR    . MYRINGOTOMY WITH TUBE PLACEMENT Bilateral   . ORIF WRIST FRACTURE Left 09/09/2016   Procedure: OPEN REDUCTION INTERNAL FIXATION (ORIF) LEFT WRIST FRACTURE, LEFT CARPAL TUNNEL RELEASE;  Surgeon: Dominica Severin, MD;  Location: [facility] ORS;  Service: Orthopedics;  Laterality: Left;  . TONSILLECTOMY       OB History   No obstetric  history on file.      Home Medications    Prior to Admission medications   Medication Sig Start Date End Date Taking? Authorizing Provider  acetaminophen (TYLENOL) 500 MG tablet Take 1 tablet (500 mg total) by mouth every 6 (six) hours as needed for mild pain. Patient taking differently: Take 1,000 mg by mouth every 6 (six) hours as needed for mild pain.  03/28/18  Yes Rolly Salter, MD  diltiazem (CARDIZEM CD) 120 MG 24 hr capsule Take 1 capsule (120 mg total) by mouth daily for 30 days. 04/14/18 05/30/18 Yes Tegeler, Canary Brim, MD  furosemide (LASIX) 40 MG tablet Take 40 mg twice daily till 04/02/2018, after that take 40 mg daily. Patient taking differently: Take 40 mg by mouth daily. Take 40 mg twice daily till 04/02/2018, after that take 40 mg daily. 04/14/18  Yes Tegeler, Canary Brim, MD  gabapentin (NEURONTIN) 100 MG capsule Take 2 capsules (200 mg total) by mouth 3 (three) times daily for 30 days. 05/19/18 06/18/18 Yes Lorre Nick, MD  hydrOXYzine (ATARAX/VISTARIL) 25 MG tablet Take 1 tablet (25 mg  total) by mouth daily as needed for up to 30 doses (anxiety). Patient taking differently: Take 25 mg by mouth daily as needed for anxiety (withdrawal).  04/14/18  Yes Tegeler, Canary Brim, MD  ipratropium-albuterol (DUONEB) 0.5-2.5 (3) MG/3ML SOLN Take 3 mLs by nebulization 2 (two) times daily. AND every 4 hours as needed for wheezing/shortness of breath   Yes [provider]  lidocaine (LIDODERM) 5 % Place 1 patch onto the skin daily. Remove & Discard patch within 12 hours or as directed by MD 05/03/18  Yes Henderly, Britni A, PA-C  magnesium oxide (MAG-OX) 400 MG tablet Take 400 mg by mouth daily.   Yes [provider]  ondansetron (ZOFRAN) 4 MG tablet Take 1 tablet (4 mg total) by mouth every 8 (eight) hours as needed for nausea or vomiting. 05/15/18  Yes Mesner, Barbara Cower, MD  pantoprazole (PROTONIX) 40 MG tablet Take 1 tablet (40 mg total) by mouth daily for 30 days. 04/14/18  05/30/18 Yes Tegeler, Canary Brim, MD  potassium chloride SA (K-DUR,KLOR-CON) 20 MEQ tablet Take 2 tablets (40 mEq total) by mouth 2 (two) times daily. 04/19/18  Yes Bethel Born, PA-C  rizatriptan (MAXALT) 10 MG tablet Take 10 mg by mouth as needed for migraine. May repeat in 2 hours if needed   Yes [provider]  rizatriptan (MAXALT) 5 MG tablet Take 1 tablet (5 mg total) by mouth as needed for migraine. May repeat in 2 hours if needed; not to exceed 30mg  in 24 hours 05/19/18  Yes Lorre Nick, MD  spironolactone (ALDACTONE) 25 MG tablet Take 1 tablet (25 mg total) by mouth daily for 30 days. 04/14/18 05/30/18 Yes Tegeler, Canary Brim, MD  chlordiazePOXIDE (LIBRIUM) 25 MG capsule 25mg  PO TID x 1D, then 25mg  PO BID X 1D, then 25mg  PO QD X 1D Patient not taking: Reported on 05/30/2018 05/09/18   Petrucelli, Pleas Koch, PA-C  chlordiazePOXIDE (LIBRIUM) 25 MG capsule 50mg  PO TID x 1D, then 25-50mg  PO BID X 1D, then 25-50mg  PO QD X 1D Patient not taking: Reported on 05/30/2018 05/19/18   Lorre Nick, MD  ondansetron (ZOFRAN ODT) 8 MG disintegrating tablet Take 1 tablet (8 mg total) by mouth every 8 (eight) hours as needed for nausea or vomiting. Patient not taking: Reported on 05/30/2018 05/19/18   Lorre Nick, MD    Family History Family History  Problem Relation Age of Onset  . Arrhythmia Mother   . Neuropathy Mother   . Coronary artery disease Mother   . Heart disease Mother   . Heart disease Father   . Hypertension Father   . Heart attack Father   . Multiple sclerosis Maternal Aunt     Social History Social History   Tobacco Use  . Smoking status: Current Every Day Smoker    Packs/day: 1.00    Years: 36.00    Pack years: 36.00    Types: Cigarettes  . Smokeless tobacco: Never Used  Substance Use Topics  . Alcohol use: Yes    Comment: Chronic ETOH abuse  . Drug use: No     Allergies   Patient has no known allergies.   Review of Systems Review of Systems   Constitutional: Negative for chills and fever.  HENT: Negative for facial swelling and sore throat.   Respiratory: Positive for shortness of breath.   Cardiovascular: Negative for chest pain.  Gastrointestinal: Positive for abdominal distention, abdominal pain and diarrhea. Negative for blood in stool, nausea and vomiting.  Genitourinary: Negative for  dysuria.  Musculoskeletal: Negative for back pain and neck pain.  Skin: Negative for rash and wound.  Neurological: Negative for headaches.  Psychiatric/Behavioral: The patient is not nervous/anxious.      Physical Exam Updated Vital Signs BP 124/63   Pulse 95   Temp 97.9 F (36.6 C) (Rectal)   Resp 16   Ht 5\' 2"  (1.575 m)   Wt 86.2 kg   SpO2 100%   BMI 34.75 kg/m   Physical Exam Vitals signs and nursing note reviewed.  Constitutional:      General: She is not in acute distress.    Appearance: She is well-developed. She is not diaphoretic.  HENT:     Head: Normocephalic and atraumatic.     Mouth/Throat:     Pharynx: No oropharyngeal exudate.  Eyes:     General: No scleral icterus.       Right eye: No discharge.        Left eye: No discharge.     Conjunctiva/sclera: Conjunctivae normal.     Pupils: Pupils are equal, round, and reactive to light.  Neck:     Musculoskeletal: Normal range of motion and neck supple.     Thyroid: No thyromegaly.  Cardiovascular:     Rate and Rhythm: Normal rate and regular rhythm.     Heart sounds: Normal heart sounds. No murmur. No friction rub. No gallop.      Comments: Bilateral edema that is painful to the touch, some clear weeping noted, no erythema Pulmonary:     Effort: Pulmonary effort is normal. No respiratory distress.     Breath sounds: Normal breath sounds. No stridor. No wheezing or rales.  Abdominal:     General: Bowel sounds are normal. There is distension.     Palpations: Abdomen is soft.     Tenderness: There is generalized abdominal tenderness. There is no guarding or  rebound.     Comments: Ascites  Musculoskeletal:     Right lower leg: 1+ Edema present.     Left lower leg: 1+ Edema present.     Comments: Ecchymosis and tenderness over the right flank and tenderness over the right hip  Lymphadenopathy:     Cervical: No cervical adenopathy.  Skin:    General: Skin is warm and dry.     Coloration: Skin is not pale.     Findings: No rash.  Neurological:     Mental Status: She is alert.     Coordination: Coordination normal.      ED Treatments / Results  Labs (all labs ordered are listed, but only abnormal results are displayed) Labs Reviewed  COMPREHENSIVE METABOLIC PANEL - Abnormal; Notable for the following components:      Result Value   BUN <5 (*)    Creatinine, Ser 0.43 (*)    Calcium 7.8 (*)    Total Protein 6.4 (*)    Albumin 2.6 (*)    AST 69 (*)    Alkaline Phosphatase 159 (*)    Total Bilirubin 4.4 (*)    All other components within normal limits  CBC WITH DIFFERENTIAL/PLATELET - Abnormal; Notable for the following components:   WBC 3.8 (*)    Hemoglobin 9.0 (*)    HCT 31.5 (*)    MCV 79.9 (*)    MCH 22.8 (*)    MCHC 28.6 (*)    RDW 24.3 (*)    Platelets 98 (*)    Lymphs Abs 0.3 (*)    All other components within  normal limits  URINALYSIS, ROUTINE W REFLEX MICROSCOPIC - Abnormal; Notable for the following components:   Color, Urine AMBER (*)    APPearance HAZY (*)    All other components within normal limits  ETHANOL - Abnormal; Notable for the following components:   Alcohol, Ethyl (B) 96 (*)    All other components within normal limits  LIPASE, BLOOD  MAGNESIUM  TROPONIN I  BRAIN NATRIURETIC PEPTIDE    EKG EKG Interpretation  Date/Time:  Tuesday May 30 2018 09:10:51 EDT Ventricular Rate:  99 PR Interval:    QRS Duration: 94 QT Interval:  400 QTC Calculation: 514 R Axis:   84 Text Interpretation:  Sinus rhythm Nonspecific T abnormalities, diffuse leads Prolonged QT interval Confirmed by Tilden Fossa  [phone]) on 05/30/2018 9:23:07 AM   Radiology Dg Pelvis Portable  Result Date: 05/30/2018 CLINICAL DATA:  58 year old female status post fall from bed. Combative. EXAM: PORTABLE PELVIS 1-2 VIEWS COMPARISON:  CT Abdomen and Pelvis 04/27/2018. FINDINGS: Portable AP supine view at 0915 hours. Mildly rotated to the left. Femoral heads are normally located. Hip joint spaces appear normal. Grossly intact visible proximal femurs. No pelvis fracture identified. Negative visible bowel gas pattern. IMPRESSION: No acute fracture or dislocation identified about the pelvis. Electronically Signed   By: Odessa Fleming M.D.   On: 05/30/2018 09:22   Dg Chest Portable 1 View  Result Date: 05/30/2018 CLINICAL DATA:  Shortness of breath. EXAM: PORTABLE CHEST 1 VIEW COMPARISON:  Radiograph of May 09, 2018. FINDINGS: Stable cardiomegaly. No pneumothorax or pleural effusion is noted. Right lung is clear. Mild left basilar subsegmental atelectasis or scarring is noted. Bony thorax is unremarkable. IMPRESSION: Mild left basilar subsegmental atelectasis or scarring. Electronically Signed   By: Lupita Raider M.D.   On: 05/30/2018 09:14    Procedures Procedures (including critical care time)  Medications Ordered in ED Medications  metoCLOPramide (REGLAN) injection 10 mg (10 mg Intravenous Given 05/30/18 0857)  fentaNYL (SUBLIMAZE) injection 50 mcg (50 mcg Intravenous Given 05/30/18 0857)     Initial Impression / Assessment and Plan / ED Course  I have reviewed the triage vital signs and the nursing notes.  Pertinent labs & imaging results that were available during my care of the patient were reviewed by me and considered in my medical decision making (see chart for details).        Patient is a known alcoholic with cirrhosis who frequents ED a lot who presents with shortness of breath and abdominal pain and distention.  Patient also fell out of bed today.  She did not hit her head or lose consciousness.  Patient has  ascites.  Her labs are stable.  UA is negative.  Portable chest and pelvis are negative except for mild left basilar subsegmental atelectasis or scarring.  CT abdomen pelvis was ordered to assess for pain as well as the ecchymosis from the fall.  We had also planned for possible paracentesis, diagnostic and therapeutic.  However, patient left AGAINST MEDICAL ADVICE because she wanted to eat and she was told that she had to wait until after her CT scan.  She understands the risks, including death, of leaving AMA. She understands she can return anytime if symptoms are worsening.  Patient also evaluated by my attending, Dr. Madilyn Hook, who guided the patient's management and agrees with plan.  Final Clinical Impressions(s) / ED Diagnoses   Final diagnoses:  Generalized abdominal pain  Ascites due to alcoholic cirrhosis (HCC)  Shortness of breath  ED Discharge Orders    None       Verdis Prime 05/30/18 1407    Tilden Fossa, MD 06/02/18 1002

## 2018-05-30 NOTE — ED Notes (Signed)
Pt eloped.

## 2018-06-01 IMAGING — US US PARACENTESIS
1 series · 9 of 9 positions shown · non-contrast
Comparison: none

INDICATION: History of alcohol abuse and cirrhosis now with symptomatic
intra-abdominal ascites. Request for ultrasound-guided

[Series 1: us paracentesis · 0.23mm/px · 9 of 9 slices shown]
[im 1/9]
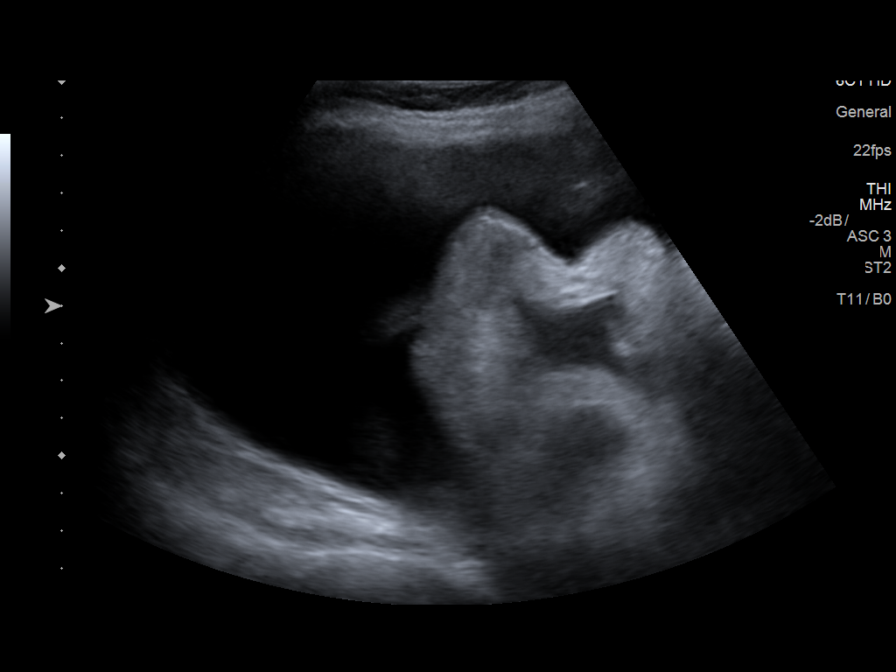
[im 2/9]
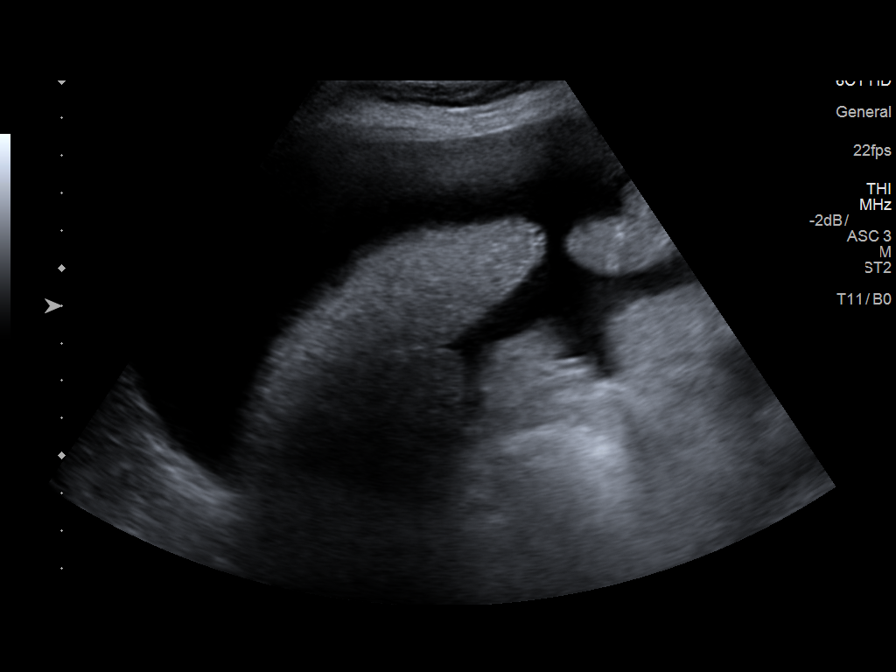
[im 3/9]
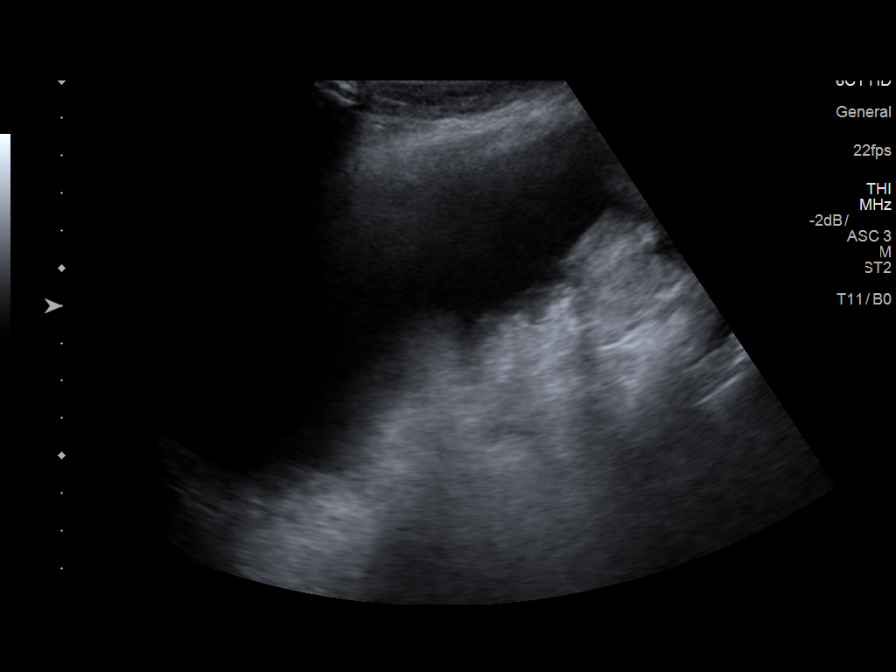
[im 4/9]
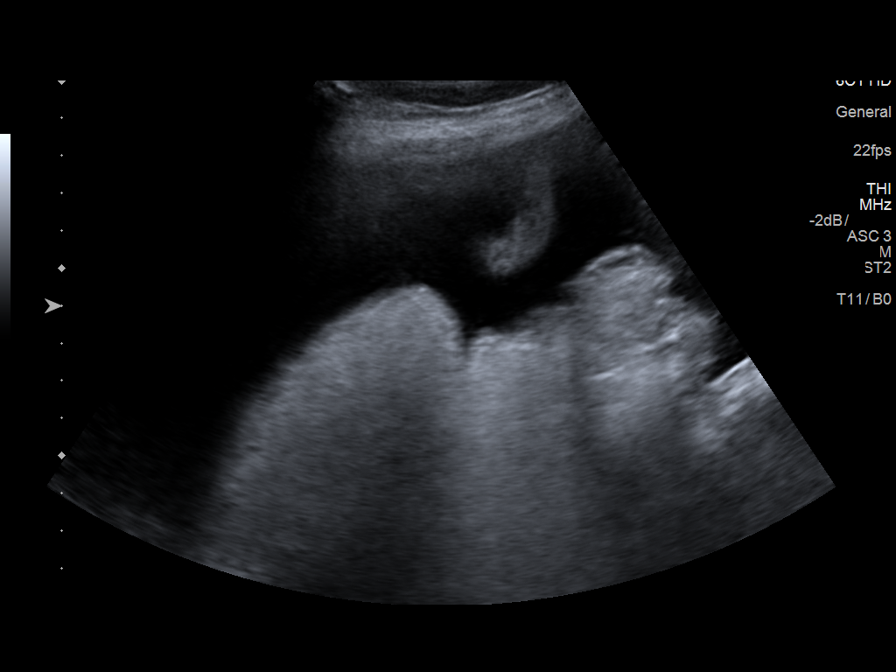
[im 5/9]
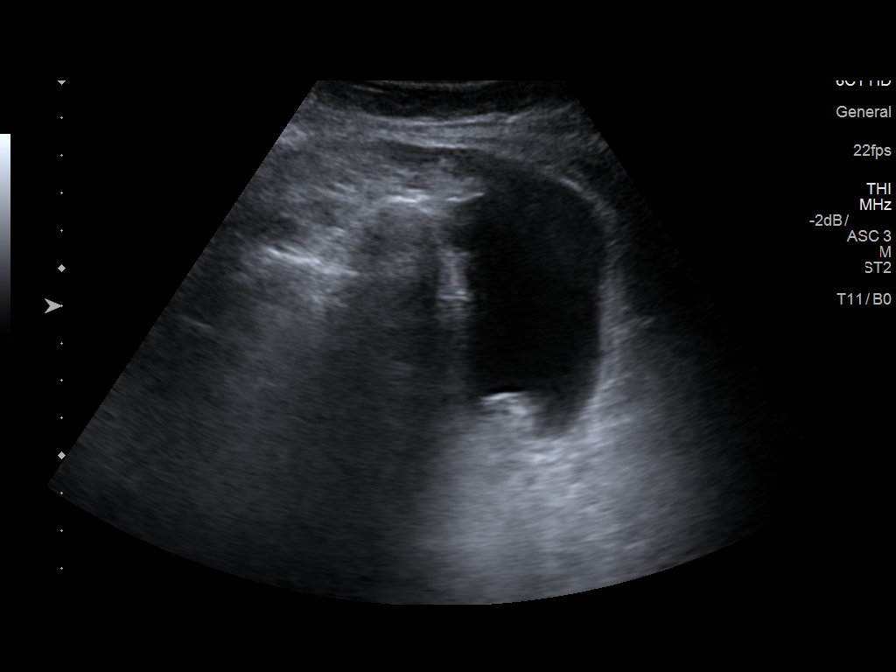
[im 6/9]
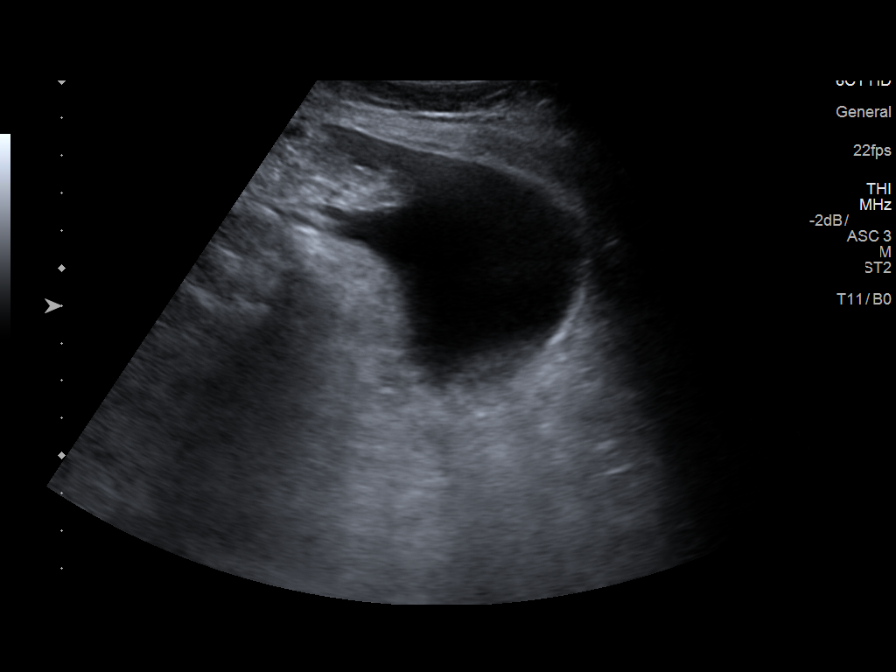
[im 7/9]
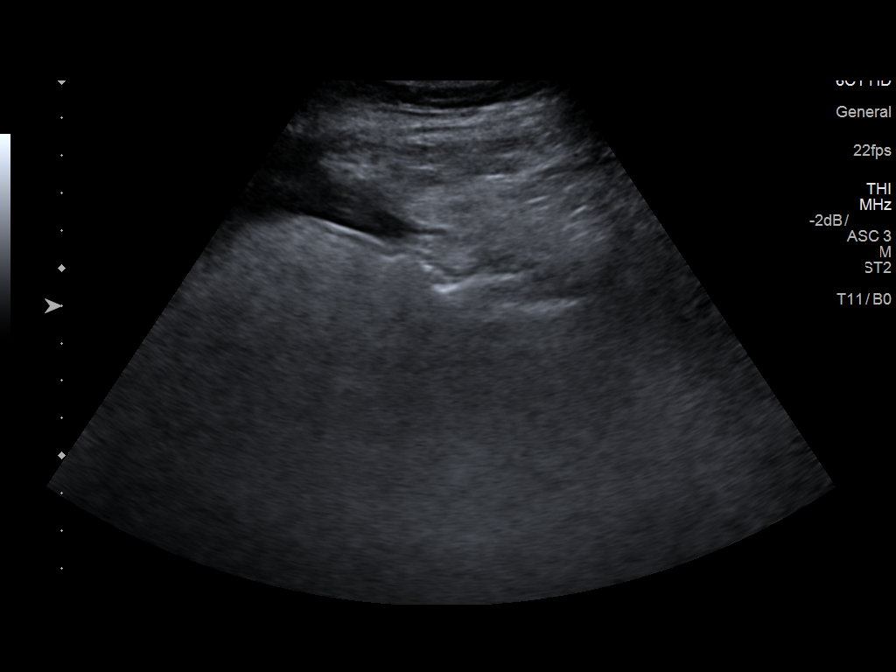
[im 8/9]
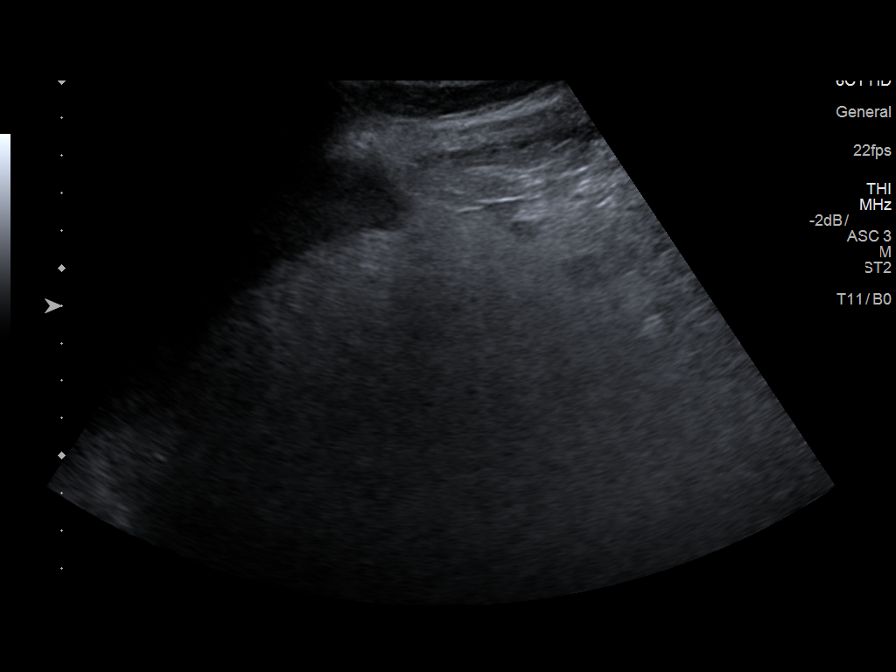
[im 9/9]
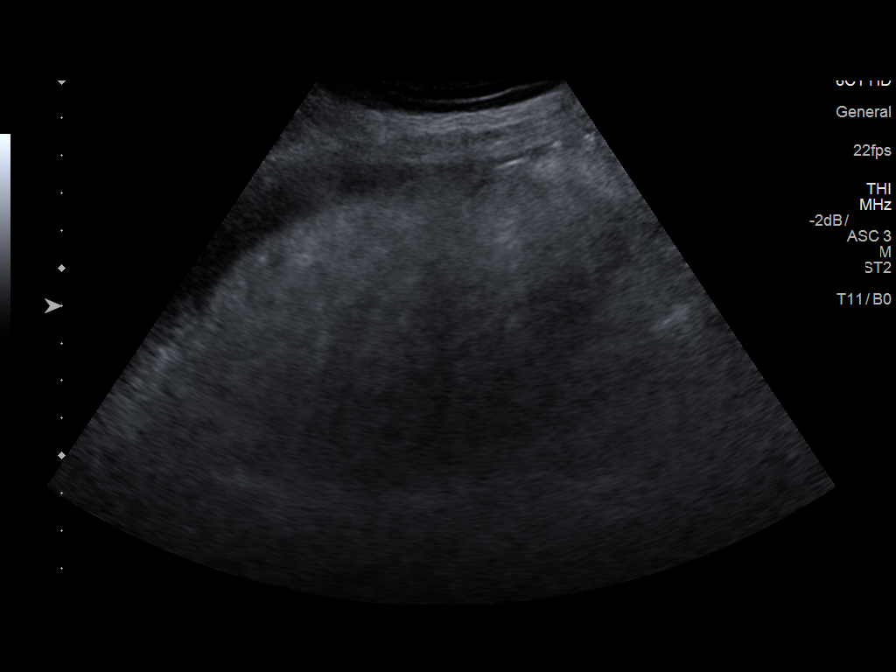

[9 of 9 positions shown; findings below may reference images not displayed]

paracentesis.

EXAM:
ULTRASOUND GUIDED RIGHT LATERAL ABDOMEN PARACENTESIS

MEDICATIONS:
1% Lidocaine.

COMPLICATIONS:
None immediate.

PROCEDURE:
Informed written consent was obtained from the patient after a
discussion of the risks, benefits and alternatives to treatment. A
timeout was performed prior to the initiation of the procedure.

Initial ultrasound scanning demonstrates a moderate amount of
ascites within the right lateral abdomen. The right lateral abdomen
was prepped and draped in the usual sterile fashion. 1% lidocaine
with epinephrine was used for local anesthesia.

Following this, a 19 gauge, 7-cm, Yueh catheter was introduced. An
ultrasound image was saved for documentation purposes. The
paracentesis was performed. The catheter was removed and a dressing
was applied. The patient tolerated the procedure well without
immediate post procedural complication.
FINDINGS: A total of approximately 3.38 liters of clear yellow fluid was
removed. Samples were sent to the laboratory as requested by the
clinical team.
IMPRESSION: Successful ultrasound-guided paracentesis yielding 3.38 liters of
peritoneal fluid.

## 2018-06-06 IMAGING — US US ABDOMEN LIMITED
1 series · 5 of 5 positions shown · non-contrast
Comparison: 10/16/2015

CLINICAL DATA: Ascites, history alcoholic cirrhosis, multiple
sclerosis, GERD

EXAM:
LIMITED ABDOMEN ULTRASOUND FOR ASCITES
TECHNIQUE: Limited ultrasound survey for ascites was performed in all four
abdominal quadrants.

[Series 1: us abdomen limited · 0.26mm/px · 5 of 5 slices shown]
[im 1/5]
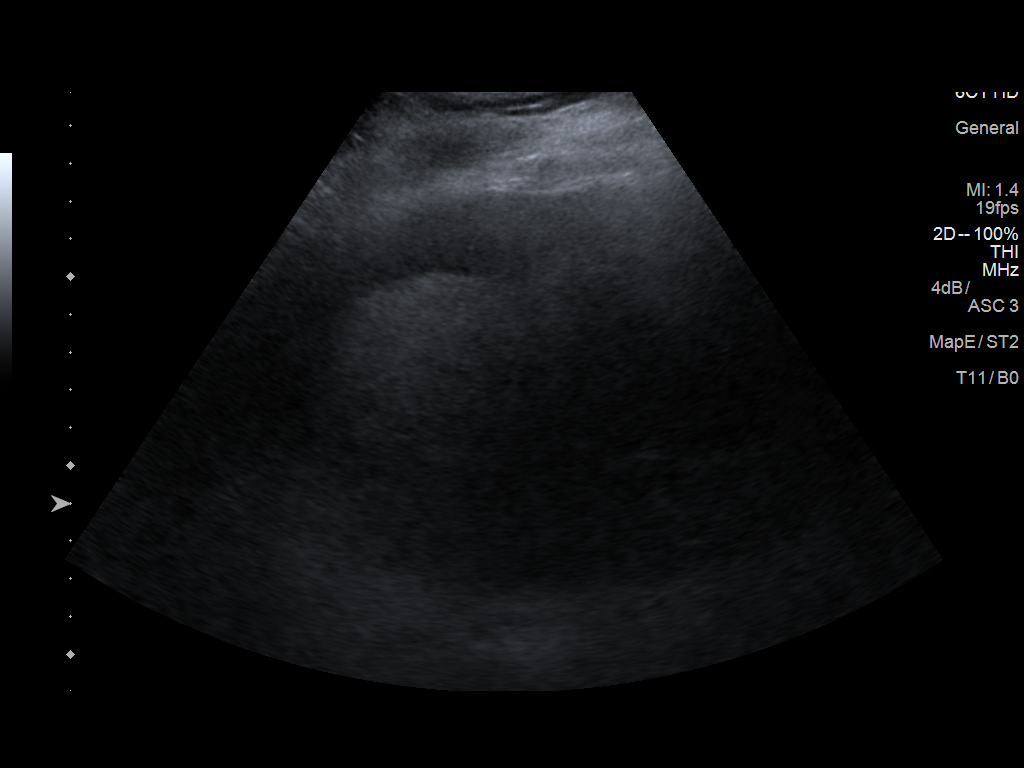
[im 2/5]
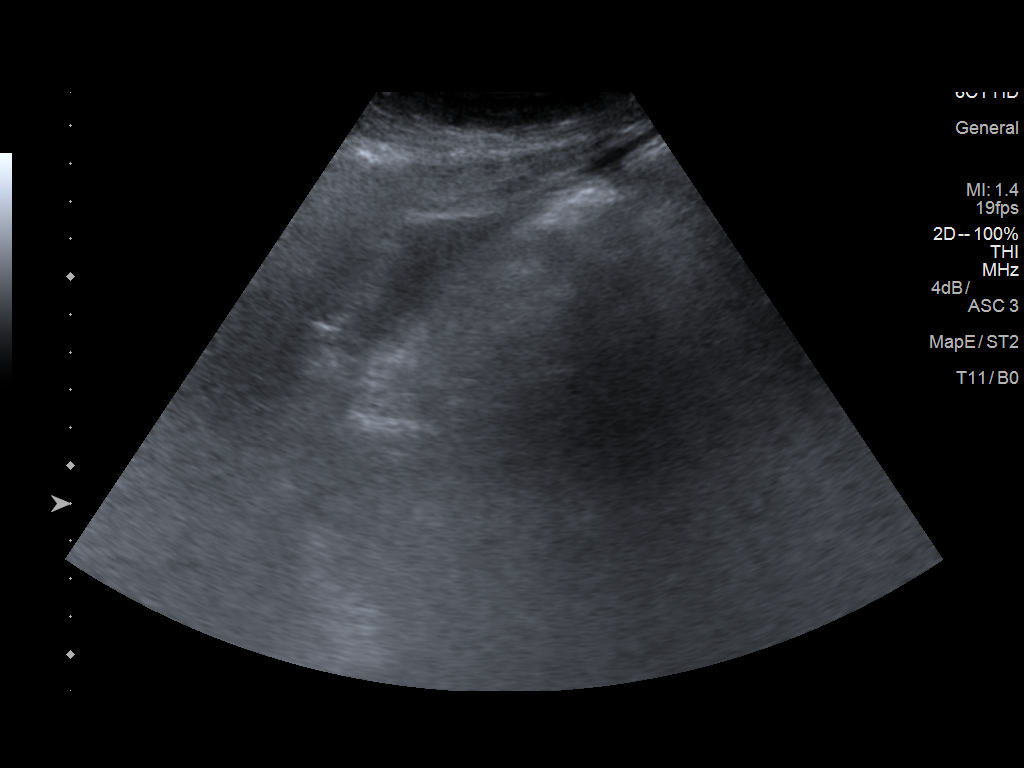
[im 3/5]
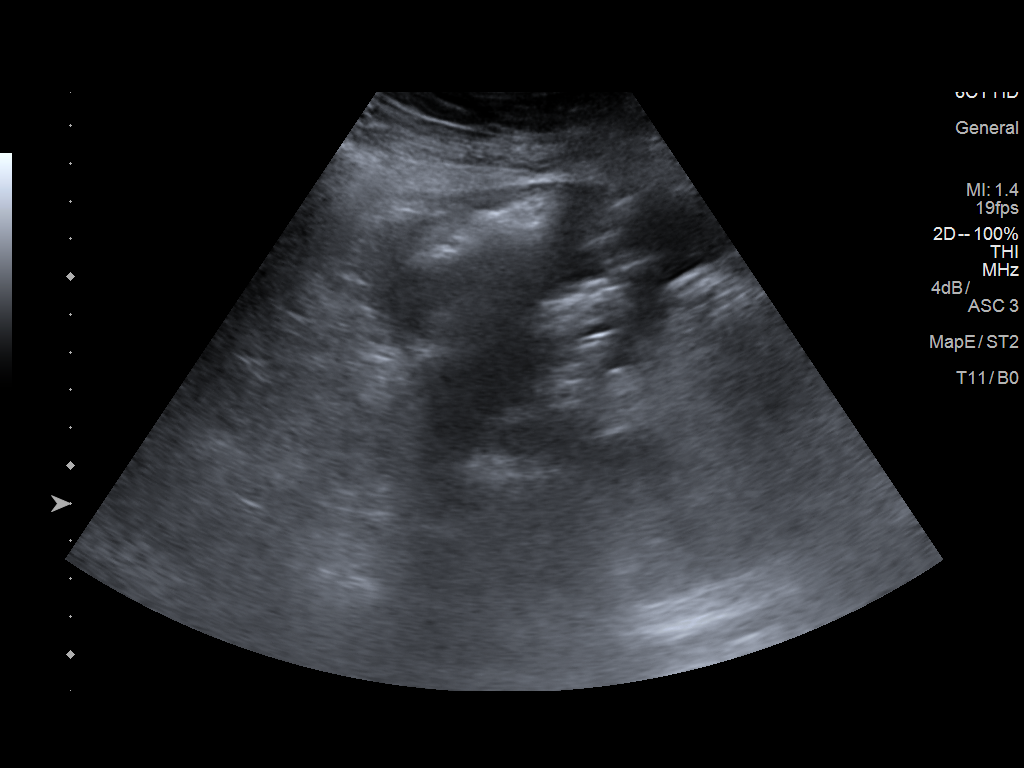
[im 4/5]
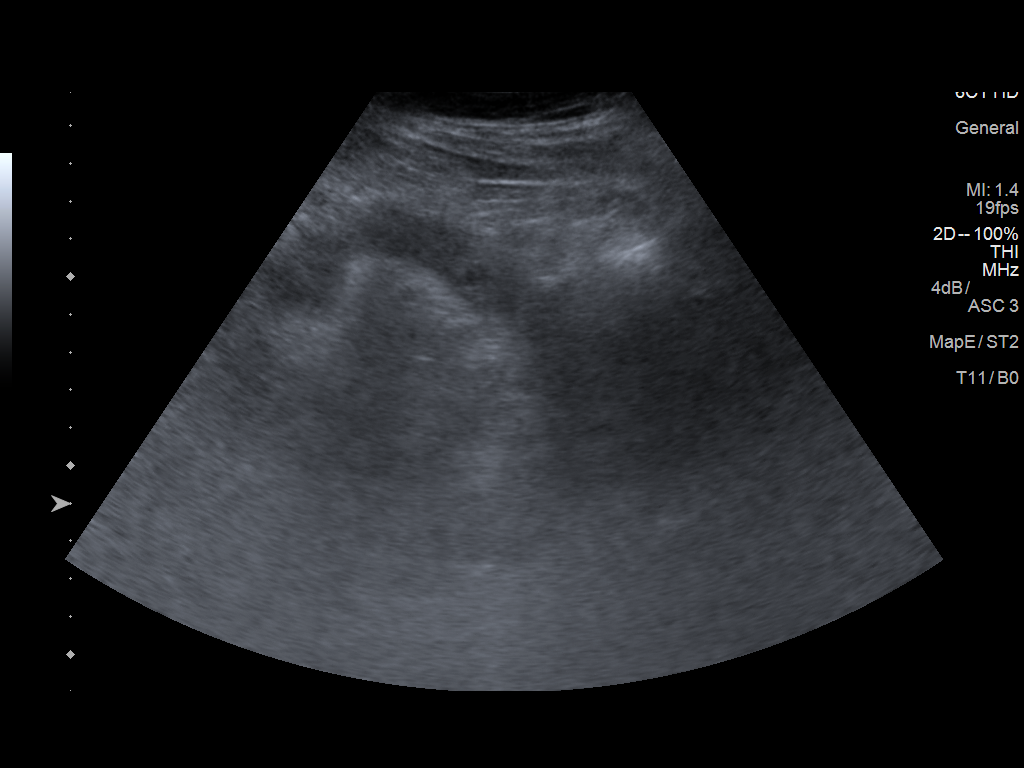
[im 5/5]
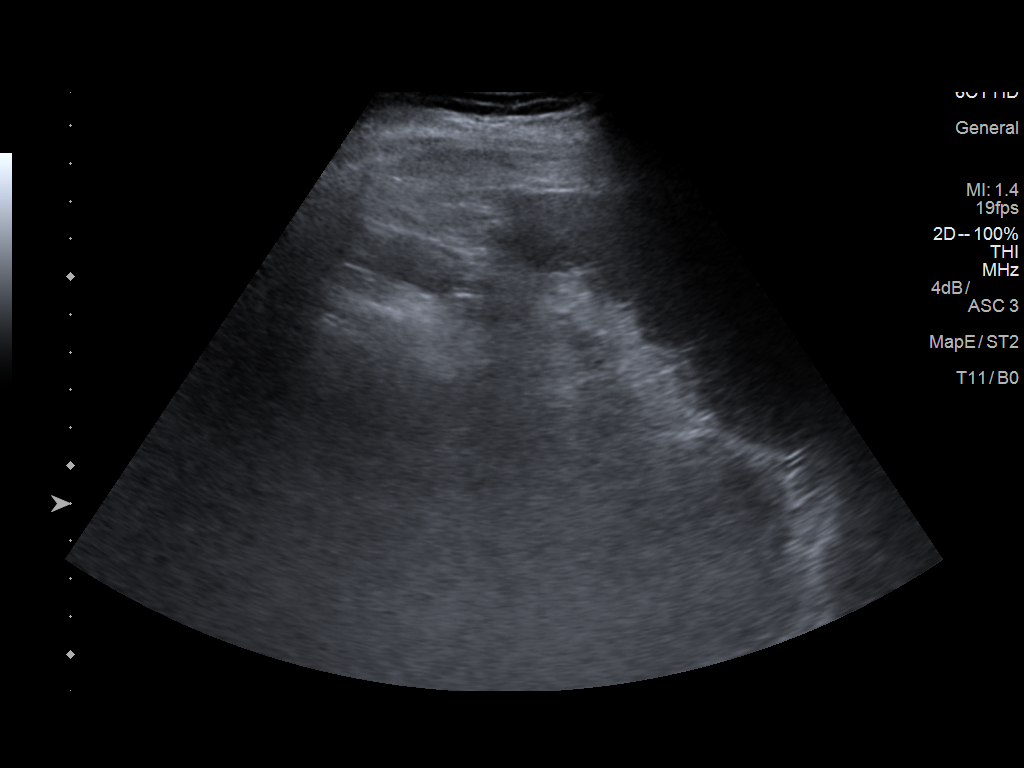

[5 of 5 positions shown; findings below may reference images not displayed]

FINDINGS: Survey imaging the abdomen demonstrates a small amount of ascites
between bowel loops in the RIGHT lower quadrant.

No significant pocket of ascites identified.
IMPRESSION: No significant ascites identified.

## 2018-06-11 ENCOUNTER — Emergency Department (HOSPITAL_COMMUNITY)
Admission: EM | Admit: 2018-06-11 | Discharge: 2018-06-11 | Discharge status: Against Medical Advice | Payer: Self-pay | Attending: Emergency Medicine | Admitting: Emergency Medicine

## 2018-06-11 ENCOUNTER — Encounter (HOSPITAL_COMMUNITY): Payer: Self-pay | Admitting: Emergency Medicine

## 2018-06-11 ENCOUNTER — Emergency Department (HOSPITAL_COMMUNITY): Payer: Self-pay

## 2018-06-11 ENCOUNTER — Other Ambulatory Visit: Payer: Self-pay

## 2018-06-11 DIAGNOSIS — Z5320 Procedure and treatment not carried out because of patient's decision for unspecified reasons: Secondary | ICD-10-CM | POA: Insufficient documentation

## 2018-06-11 DIAGNOSIS — Z79899 Other long term (current) drug therapy: Secondary | ICD-10-CM | POA: Insufficient documentation

## 2018-06-11 DIAGNOSIS — R1010 Upper abdominal pain, unspecified: Secondary | ICD-10-CM | POA: Insufficient documentation

## 2018-06-11 DIAGNOSIS — J449 Chronic obstructive pulmonary disease, unspecified: Secondary | ICD-10-CM | POA: Insufficient documentation

## 2018-06-11 DIAGNOSIS — R110 Nausea: Secondary | ICD-10-CM | POA: Insufficient documentation

## 2018-06-11 DIAGNOSIS — F17210 Nicotine dependence, cigarettes, uncomplicated: Secondary | ICD-10-CM | POA: Insufficient documentation

## 2018-06-11 DIAGNOSIS — R109 Unspecified abdominal pain: Secondary | ICD-10-CM

## 2018-06-11 LAB — CBC WITH DIFFERENTIAL/PLATELET
Abs Immature Granulocytes: 0.01 10*3/uL (ref 0.00–0.07)
Basophils Absolute: 0.1 10*3/uL (ref 0.0–0.1)
Basophils Relative: 1 %
Eosinophils Absolute: 0.1 10*3/uL (ref 0.0–0.5)
Eosinophils Relative: 1 %
HCT: 29.9 % — ABNORMAL LOW (ref 36.0–46.0)
Hemoglobin: 8.4 g/dL — ABNORMAL LOW (ref 12.0–15.0)
Immature Granulocytes: 0 %
Lymphocytes Relative: 16 %
Lymphs Abs: 0.8 10*3/uL (ref 0.7–4.0)
MCH: 22.8 pg — ABNORMAL LOW (ref 26.0–34.0)
MCHC: 28.1 g/dL — ABNORMAL LOW (ref 30.0–36.0)
MCV: 81.3 fL (ref 80.0–100.0)
Monocytes Absolute: 0.6 10*3/uL (ref 0.1–1.0)
Monocytes Relative: 14 %
Neutro Abs: 3.1 10*3/uL (ref 1.7–7.7)
Neutrophils Relative %: 68 %
Platelets: UNDETERMINED 10*3/uL (ref 150–400)
RBC: 3.68 MIL/uL — ABNORMAL LOW (ref 3.87–5.11)
RDW: 23.9 % — ABNORMAL HIGH (ref 11.5–15.5)
WBC: 4.6 10*3/uL (ref 4.0–10.5)
nRBC: 0 % (ref 0.0–0.2)

## 2018-06-11 LAB — COMPREHENSIVE METABOLIC PANEL
ALT: 24 U/L (ref 0–44)
AST: 44 U/L — ABNORMAL HIGH (ref 15–41)
Albumin: 2.7 g/dL — ABNORMAL LOW (ref 3.5–5.0)
Alkaline Phosphatase: 148 U/L — ABNORMAL HIGH (ref 38–126)
Anion gap: 8 (ref 5–15)
BUN: 6 mg/dL (ref 6–20)
CO2: 29 mmol/L (ref 22–32)
Calcium: 7.8 mg/dL — ABNORMAL LOW (ref 8.9–10.3)
Chloride: 95 mmol/L — ABNORMAL LOW (ref 98–111)
Creatinine, Ser: 0.49 mg/dL (ref 0.44–1.00)
GFR calc Af Amer: >60 mL/min (ref >60)
GFR calc non Af Amer: >60 mL/min (ref >60)
Glucose, Bld: 100 mg/dL — ABNORMAL HIGH (ref 70–99)
Potassium: 4 mmol/L (ref 3.5–5.1)
Sodium: 132 mmol/L — ABNORMAL LOW (ref 135–145)
Total Bilirubin: 3.1 mg/dL — ABNORMAL HIGH (ref 0.3–1.2)
Total Protein: 6.7 g/dL (ref 6.5–8.1)

## 2018-06-11 LAB — LIPASE, BLOOD: Lipase: 28 U/L (ref 11–51)

## 2018-06-11 LAB — ETHANOL: Alcohol, Ethyl (B): 179 mg/dL — ABNORMAL HIGH (ref <10)

## 2018-06-11 IMAGING — MR MR CERVICAL SPINE WO/W CM
4 of 9 series · 17 of 48 positions shown · IV contrast (multihance)
Comparison: None available.

CLINICAL DATA: Initial evaluation for multiple sclerosis.

EXAM:
MRI HEAD WITHOUT AND WITH CONTRAST
MRI CERVICAL SPINE WITHOUT AND WITH CONTRAST
TECHNIQUE: Multiplanar, multiecho pulse sequences of the brain and surrounding
structures, and cervical spine, to include the craniocervical
junction and cervicothoracic junction, were obtained without and
with intravenous contrast.
CONTRAST:  18mL MULTIHANCE GADOBENATE DIMEGLUMINE 529 MG/ML IV SOLN

[Series 2: T1 · sagittal · 3.0mm · 0.41mm/px · 3 of 13 slices shown (1 of 2)]
[im 1/13]
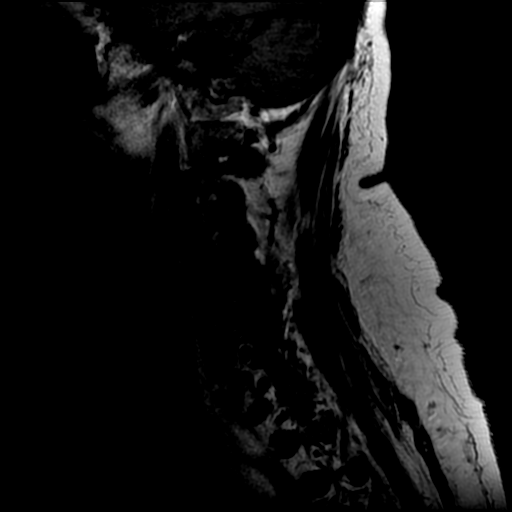
[im 9/13]
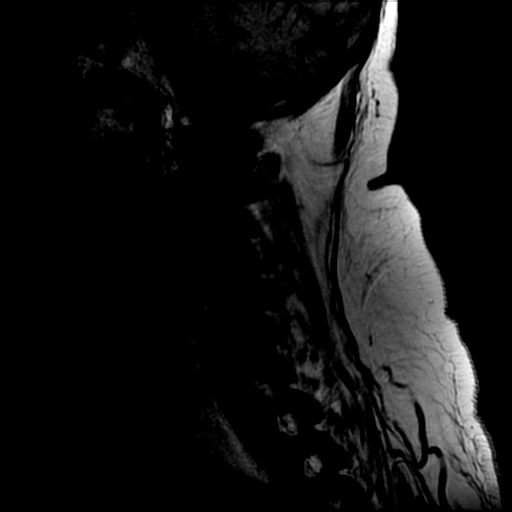
[im 13/13]
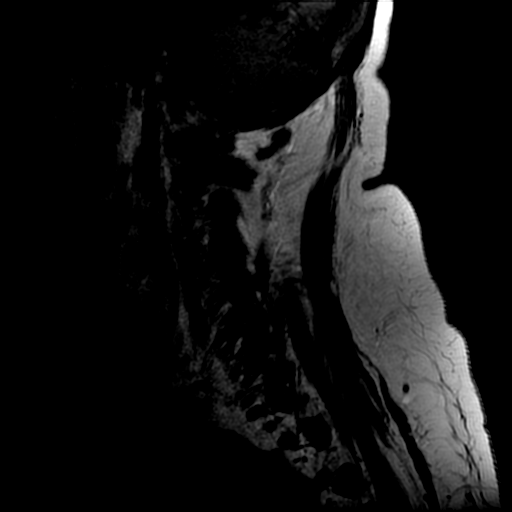

[Series 5: T2 · axial · 3.1mm · 0.35mm/px · z∈[-68,+28]mm · 8 of 29 slices shown]
[im 1/29]
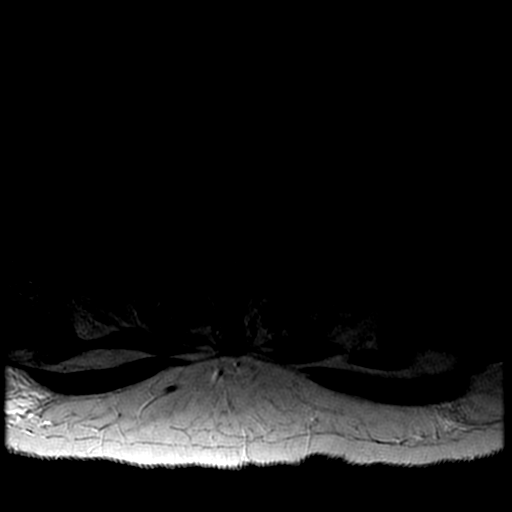
[im 5/29]
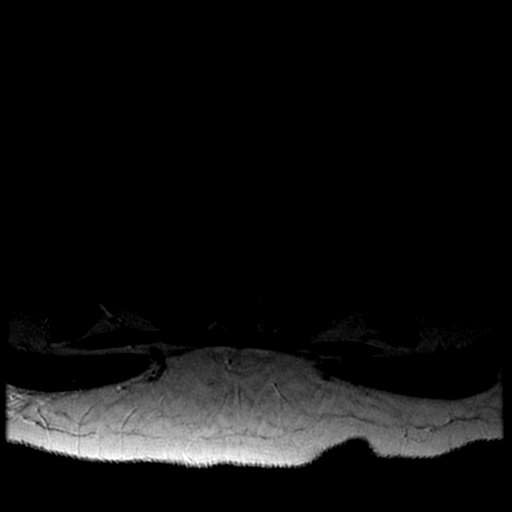
[im 9/29]
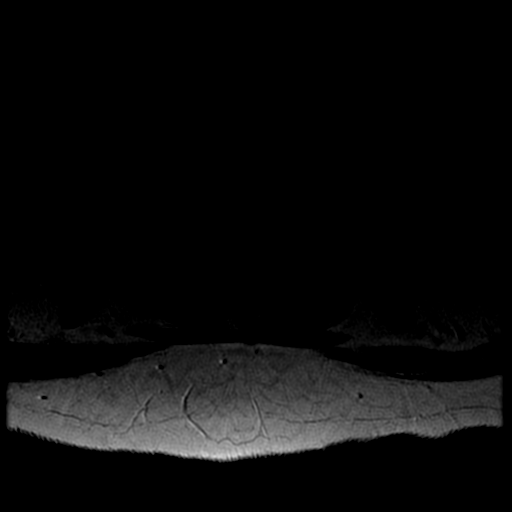
[im 13/29]
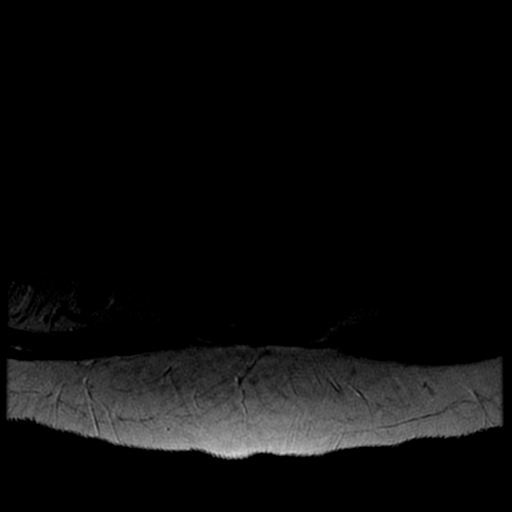
[im 17/29]
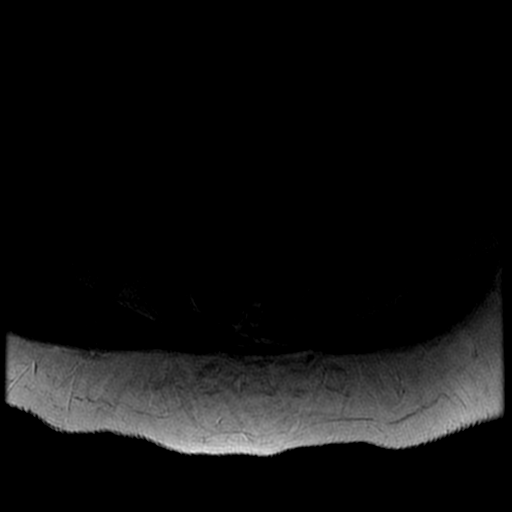
[im 21/29]
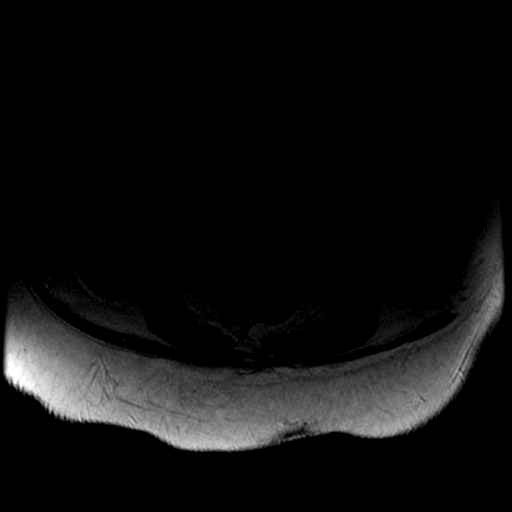
[im 25/29]
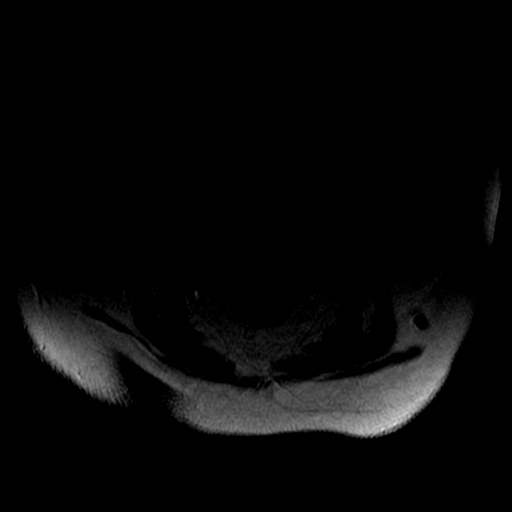
[im 29/29]
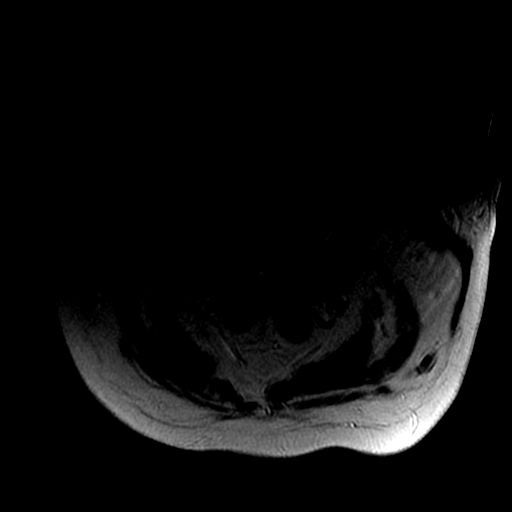

[Series 6: T1 · axial · non-contrast · 3.1mm · 0.35mm/px · z∈[-55,+15]mm · 3 of 29 slices shown (2 of 2)]
[im 5/29]
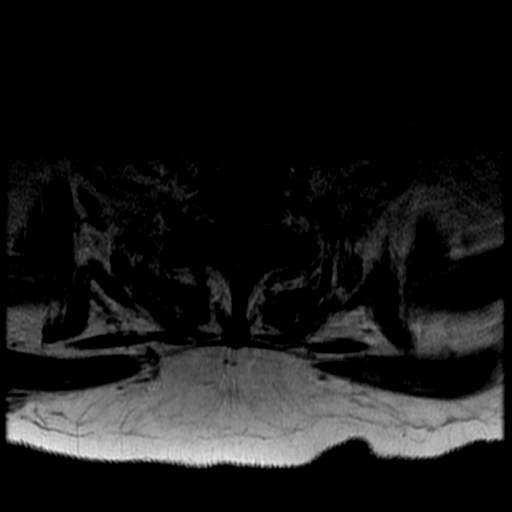
[im 17/29]
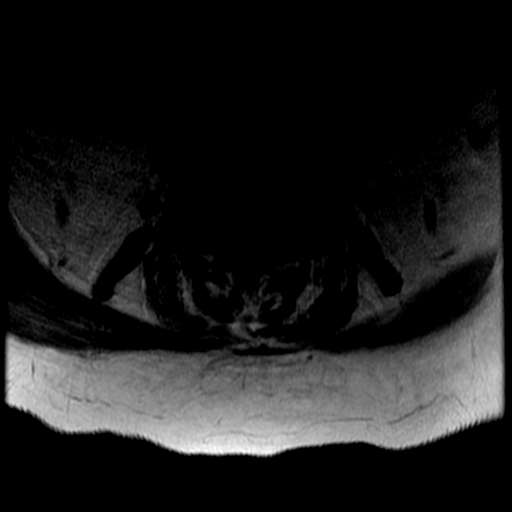
[im 25/29]
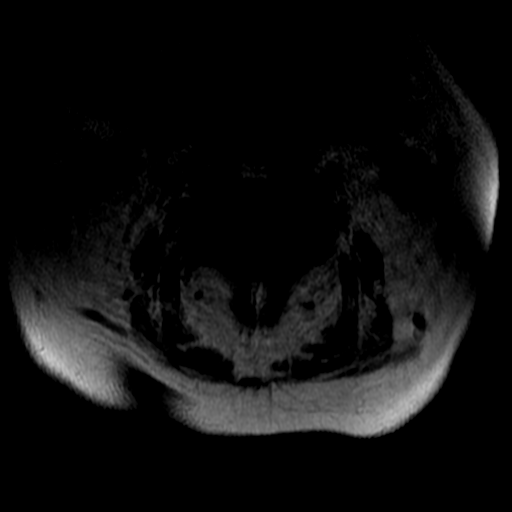

[Series 8: T2 post-contrast · sagittal · 3.0mm · 0.41mm/px · 3 of 13 slices shown]
[im 1/13]
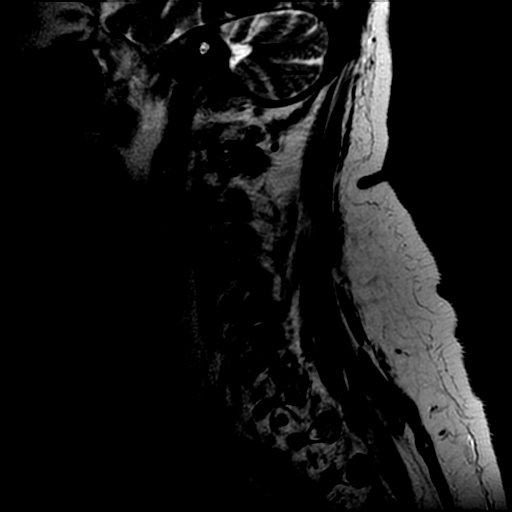
[im 7/13]
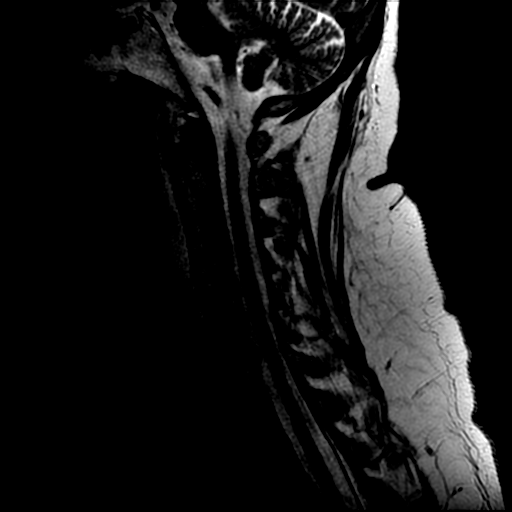
[im 13/13]
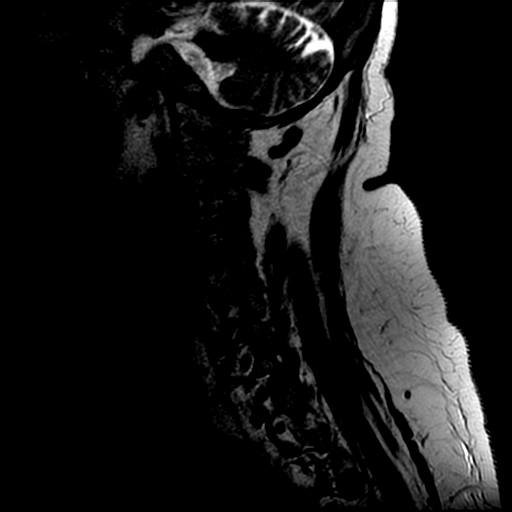

[17 of 48 positions shown; findings below may reference images not displayed]

FINDINGS: MRI HEAD FINDINGS

Study mildly degraded by motion artifact.

Diffuse prominence of the CSF containing spaces is compatible with
generalized cerebral atrophy, advanced for patient age. Multiple
patchy and confluent T2/FLAIR hyperintense foci present within the
periventricular, deep, and subcortical white matter of both cerebral
hemispheres. A few of these foci are oriented perpendicular to the
lateral ventricles. There is involvement of the corpus callosum.
Findings most likely related to provided history of underlying
multiple sclerosis. No infratentorial lesions identified. No
abnormal restricted diffusion or enhancement to suggest active
demyelination.

No evidence for acute infarct. Gray-white matter differentiation
maintained. Major intracranial vascular flow voids are preserved. No
acute or chronic intracranial hemorrhage. No areas of chronic
infarction.

No mass lesion, midline shift, or mass effect. No hydrocephalus. No
extra-axial fluid collection. No abnormal enhancement.

Craniocervical junction within normal limits. Visualized upper
cervical spine unremarkable.

Pituitary gland normal.

No acute abnormality about the globes and orbits.

Paranasal sinuses are clear. Minimal scattered opacity within the
right mastoid air cells. Inner ear structures normal.

Bone marrow signal intensity within normal limits. No scalp soft
tissue abnormality.

MRI CERVICAL SPINE FINDINGS

Alignment: Vertebral bodies normally aligned with preservation of
the normal cervical lordosis. No listhesis or subluxation.

Vertebrae: Vertebral body heights well maintained. No evidence for
acute or chronic fracture. Signal intensity within the vertebral
body bone marrow is normal.

Cord: Signal intensity within the cervical spinal cord is normal. No
abnormal cord signal intensity to suggest underlying demyelinating
disease. No abnormal enhancement.

Posterior Fossa, vertebral arteries, paraspinal tissues: Visualized
portions of the brain and posterior fossa are within normal limits.
Craniocervical junction normal. Paraspinous soft tissues demonstrate
no acute abnormality. No prevertebral or paraspinous edema. Normal
intravascular flow voids present within the vertebral arteries
bilaterally.

Disc levels:

C2-C3: Negative.

C3-C4:  Negative.

C4-C5: Mild degenerative disc bulge with bilateral uncovertebral
hypertrophy. No significant stenosis.

C5-C6:  Minimal bilateral uncovertebral spurring.  No stenosis.

C6-C7: Broad-based posterior disc bulge with bilateral uncovertebral
spurring. Minimal flattening of the ventral thecal sac without
significant stenosis. Foramina remain patent.

C7-T1:  Mild bilateral uncovertebral spurring without stenosis.

Visualized portions of the upper thoracic spine demonstrate no acute
abnormality. No significant stenosis.
IMPRESSION: MRI BRAIN IMPRESSION:

1. Patchy and confluent cerebral white matter disease as above, most
likely related to patient's history of underlying multiple
sclerosis. No evidence for active demyelination.
2. Advanced cerebral atrophy for age, also likely related underlying
demyelinating disease.
3. No other acute intracranial process identified.

MRI CERVICAL SPINE IMPRESSION:

1. No MRI evidence for underlying demyelinating disease within the
cervical spinal cord.
2. Shallow degenerative disc bulge at C6-7 without significant
stenosis.
3. Additional relatively minor multilevel degenerative changes as
above. No significant stenosis.

## 2018-06-11 IMAGING — MR MR HEAD WO/W CM
7 of 15 series · 28 of 48 positions shown · IV contrast (Yes)
Comparison: None available.

CLINICAL DATA: Initial evaluation for multiple sclerosis.

EXAM:
MRI HEAD WITHOUT AND WITH CONTRAST
MRI CERVICAL SPINE WITHOUT AND WITH CONTRAST
TECHNIQUE: Multiplanar, multiecho pulse sequences of the brain and surrounding
structures, and cervical spine, to include the craniocervical
junction and cervicothoracic junction, were obtained without and
with intravenous contrast.
CONTRAST:  18mL MULTIHANCE GADOBENATE DIMEGLUMINE 529 MG/ML IV SOLN

[Series 4: DWI · axial · 3.0mm · 1.09mm/px · z∈[-80,+84]mm · 7 of 112 slices shown (1 of 4)]
[im 1/112]
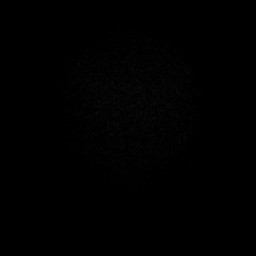
[im 19/112]
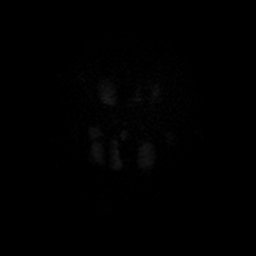
[im 38/112]
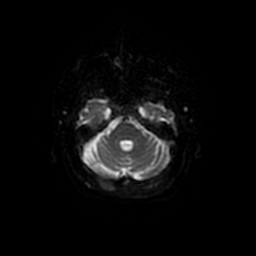
[im 56/112]
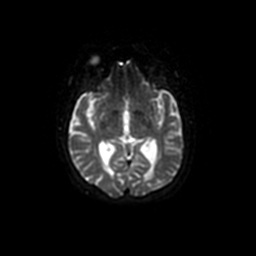
[im 75/112]
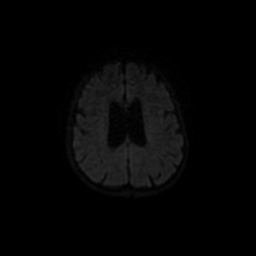
[im 93/112]
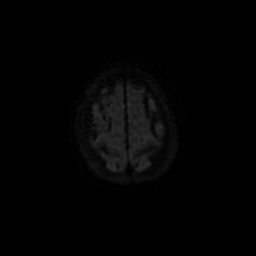
[im 112/112]
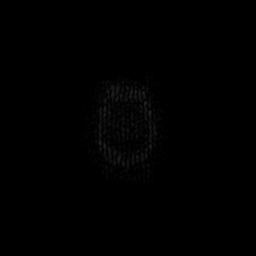

[Series 5: DWI · coronal · 5.0mm · 1.09mm/px · 4 of 74 slices shown (2 of 4)]
[im 1/74]
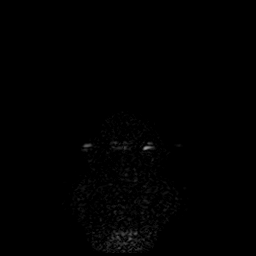
[im 25/74]
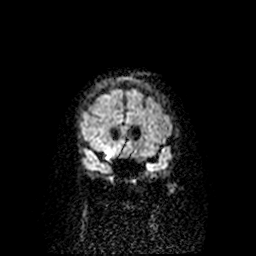
[im 49/74]
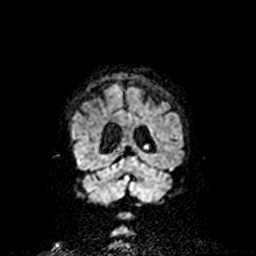
[im 74/74]
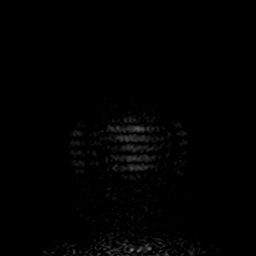

[Series 7: FLAIR · axial · 5.0mm · 0.43mm/px · z∈[-87,+87]mm · 2 of 26 slices shown (1 of 3)]
[im 1/26]
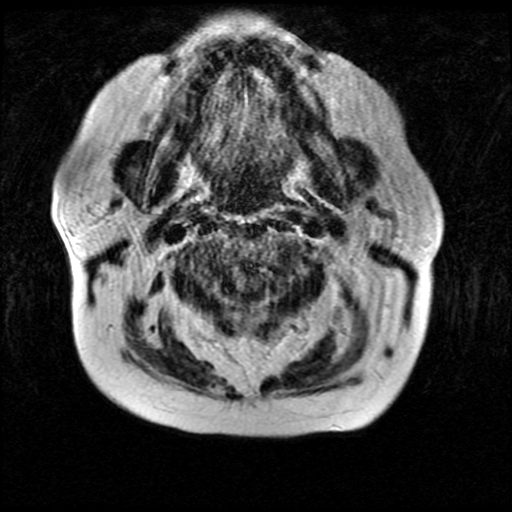
[im 26/26]
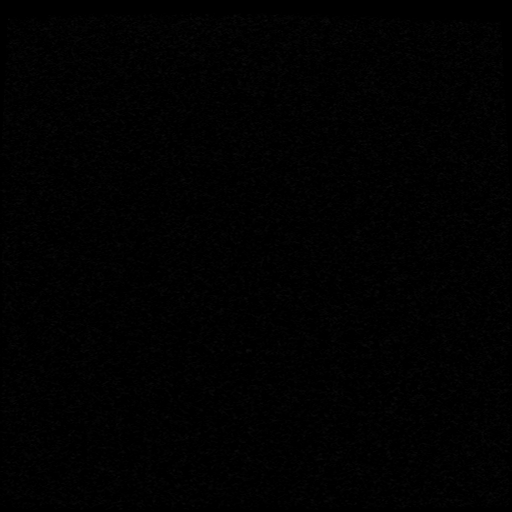

[Series 8: FLAIR · sagittal · 1.6mm · 0.47mm/px · 9 of 224 slices shown (2 of 3)]
[im 1/224]
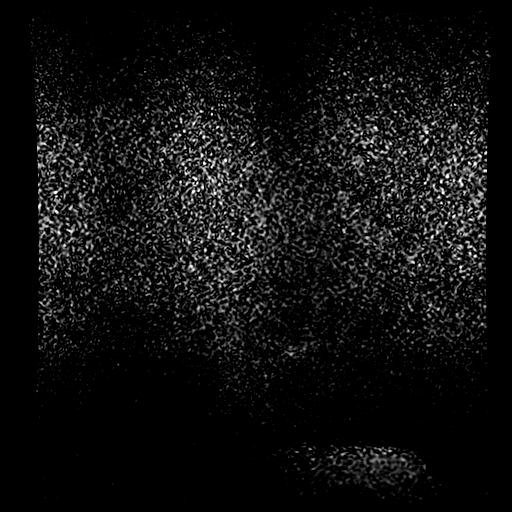
[im 38/224]
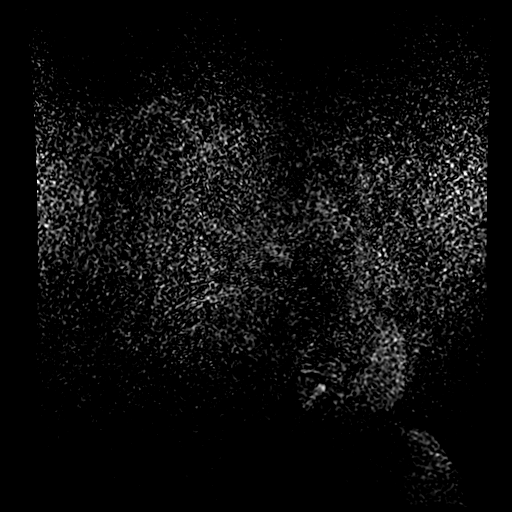
[im 75/224]
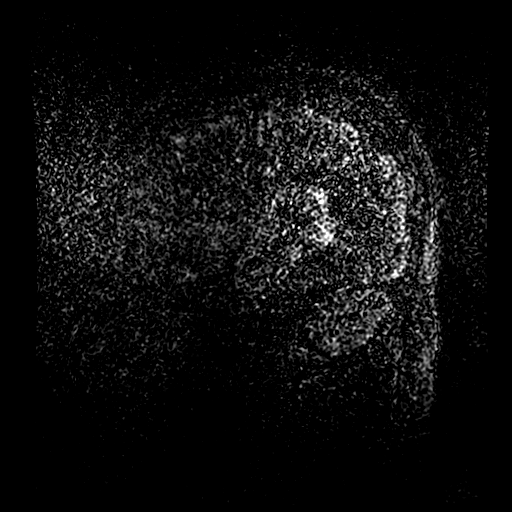
[im 93/224]
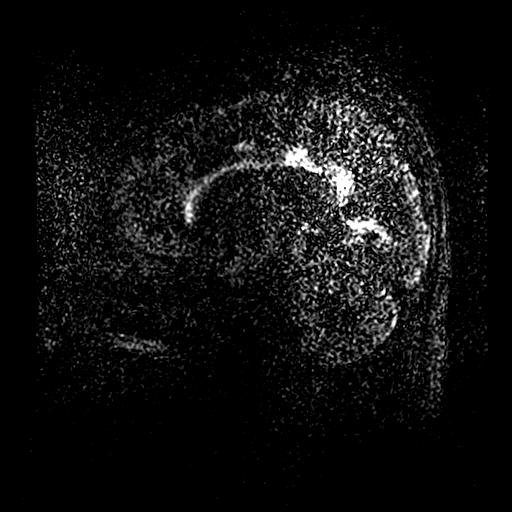
[im 112/224]
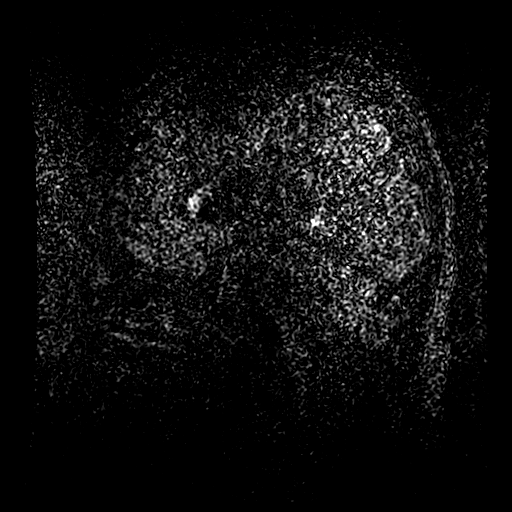
[im 131/224]
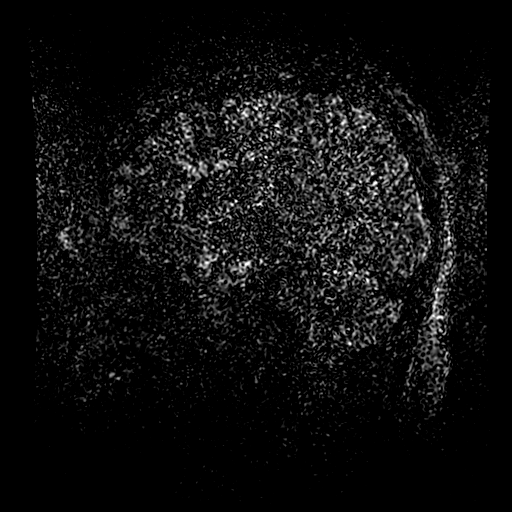
[im 149/224]
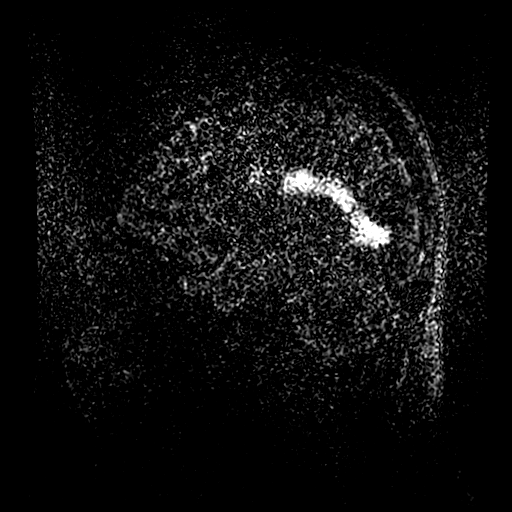
[im 186/224]
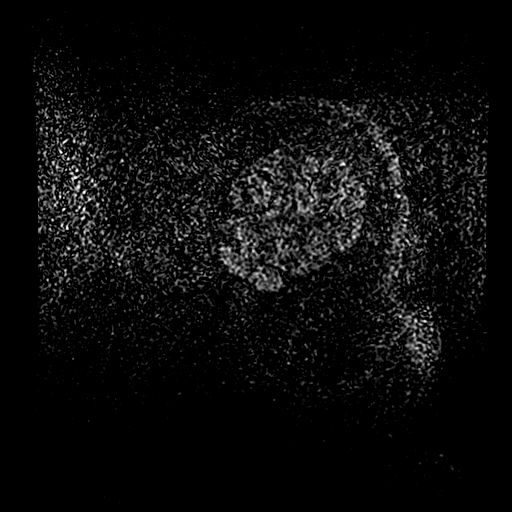
[im 224/224]
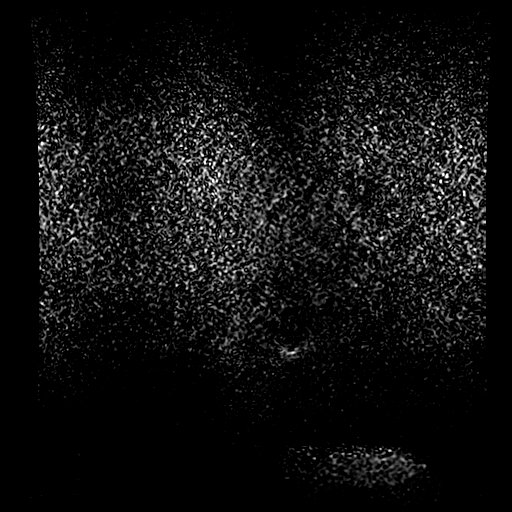

[Series 9: FLAIR · sagittal · 5.0mm · 0.43mm/px · 1 of 22 slices shown (3 of 3)]
[im 1/22]
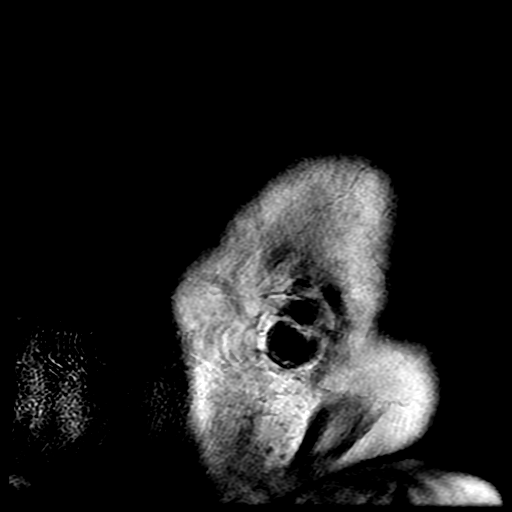

[Series 400: DWI · axial · 3.0mm · 1.09mm/px · z∈[-80,+84]mm · 3 of 56 slices shown (3 of 4)]
[im 1/56]
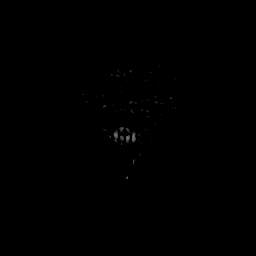
[im 28/56]
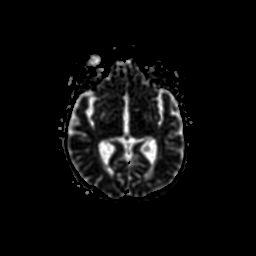
[im 56/56]
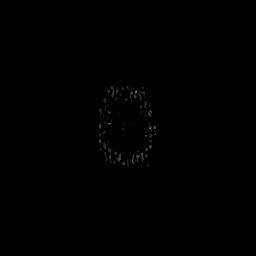

[Series 500: DWI · coronal · 5.0mm · 1.09mm/px · 2 of 36 slices shown (4 of 4)]
[im 1/36]
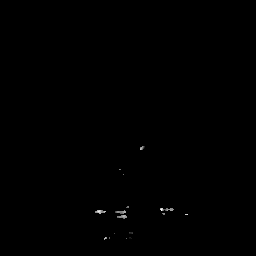
[im 36/36]
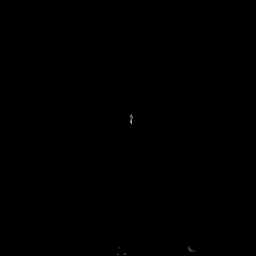

[28 of 48 positions shown; findings below may reference images not displayed]

FINDINGS: MRI HEAD FINDINGS

Study mildly degraded by motion artifact.

Diffuse prominence of the CSF containing spaces is compatible with
generalized cerebral atrophy, advanced for patient age. Multiple
patchy and confluent T2/FLAIR hyperintense foci present within the
periventricular, deep, and subcortical white matter of both cerebral
hemispheres. A few of these foci are oriented perpendicular to the
lateral ventricles. There is involvement of the corpus callosum.
Findings most likely related to provided history of underlying
multiple sclerosis. No infratentorial lesions identified. No
abnormal restricted diffusion or enhancement to suggest active
demyelination.

No evidence for acute infarct. Gray-white matter differentiation
maintained. Major intracranial vascular flow voids are preserved. No
acute or chronic intracranial hemorrhage. No areas of chronic
infarction.

No mass lesion, midline shift, or mass effect. No hydrocephalus. No
extra-axial fluid collection. No abnormal enhancement.

Craniocervical junction within normal limits. Visualized upper
cervical spine unremarkable.

Pituitary gland normal.

No acute abnormality about the globes and orbits.

Paranasal sinuses are clear. Minimal scattered opacity within the
right mastoid air cells. Inner ear structures normal.

Bone marrow signal intensity within normal limits. No scalp soft
tissue abnormality.

MRI CERVICAL SPINE FINDINGS

Alignment: Vertebral bodies normally aligned with preservation of
the normal cervical lordosis. No listhesis or subluxation.

Vertebrae: Vertebral body heights well maintained. No evidence for
acute or chronic fracture. Signal intensity within the vertebral
body bone marrow is normal.

Cord: Signal intensity within the cervical spinal cord is normal. No
abnormal cord signal intensity to suggest underlying demyelinating
disease. No abnormal enhancement.

Posterior Fossa, vertebral arteries, paraspinal tissues: Visualized
portions of the brain and posterior fossa are within normal limits.
Craniocervical junction normal. Paraspinous soft tissues demonstrate
no acute abnormality. No prevertebral or paraspinous edema. Normal
intravascular flow voids present within the vertebral arteries
bilaterally.

Disc levels:

C2-C3: Negative.

C3-C4:  Negative.

C4-C5: Mild degenerative disc bulge with bilateral uncovertebral
hypertrophy. No significant stenosis.

C5-C6:  Minimal bilateral uncovertebral spurring.  No stenosis.

C6-C7: Broad-based posterior disc bulge with bilateral uncovertebral
spurring. Minimal flattening of the ventral thecal sac without
significant stenosis. Foramina remain patent.

C7-T1:  Mild bilateral uncovertebral spurring without stenosis.

Visualized portions of the upper thoracic spine demonstrate no acute
abnormality. No significant stenosis.
IMPRESSION: MRI BRAIN IMPRESSION:

1. Patchy and confluent cerebral white matter disease as above, most
likely related to patient's history of underlying multiple
sclerosis. No evidence for active demyelination.
2. Advanced cerebral atrophy for age, also likely related underlying
demyelinating disease.
3. No other acute intracranial process identified.

MRI CERVICAL SPINE IMPRESSION:

1. No MRI evidence for underlying demyelinating disease within the
cervical spinal cord.
2. Shallow degenerative disc bulge at C6-7 without significant
stenosis.
3. Additional relatively minor multilevel degenerative changes as
above. No significant stenosis.

## 2018-06-11 MED ORDER — ONDANSETRON HCL 4 MG/2ML IJ SOLN
4.0000 mg | Freq: Once | INTRAMUSCULAR | Status: AC
  Administered 2018-06-11: 22:00:00 4 mg via INTRAVENOUS
  Filled 2018-06-11: qty 2

## 2018-06-11 MED ORDER — SODIUM CHLORIDE 0.9 % IV SOLN
INTRAVENOUS | Status: DC
  Administered 2018-06-11: 20 mL/h via INTRAVENOUS

## 2018-06-11 NOTE — ED Notes (Signed)
Patient refuses to wait for lab results, states she has called her daughter and she is on her way. EDP made aware. Pt acknowledges and signed AMA form.

## 2018-06-11 NOTE — ED Provider Notes (Signed)
[facility]-EMERGENCY DEPT Provider Note   CSN: [id number] Arrival date & time: 06/11/18  2104    History   Chief Complaint Chief Complaint  Patient presents with  . Abdominal Pain  . Emesis  . Nausea    HPI Anne Black is a 58 y.o. female.     58 year old female with multiple ED visits with history of alcohol cirrhosis, ascites presents with diffuse abdominal distention with nausea but no vomiting.  No fever or chills.  No black or bloody stools.  Denies any urinary symptoms.  No use of alcohol in several days and denies any tremors or withdrawal symptoms.  Patient is unsure of her last bowel movement.  Her symptoms have been recurrent and she has been seen for this in the past without a diagnosis.  No treatment used prior to arrival and transported via EMS.     Past Medical History:  Diagnosis Date  . ADHD (attention deficit hyperactivity disorder)   . Alcoholic cirrhosis (HCC)    with ascites  . Alcoholism (HCC)   . Allergies   . Anxiety   . Arthritis   . Atrial fibrillation with RVR (HCC)   . Bipolar disorder (HCC)   . COPD (chronic obstructive pulmonary disease) (HCC)    wears 2L chronic O2  . Dyspnea   . EtOH dependence (HCC) 05/14/2018  . GERD (gastroesophageal reflux disease)   . History of hiatal hernia   . Hypokalemia   . Migraine   . Multiple sclerosis (HCC)   . Narcolepsy   . Osteoarthritis of knee   . Osteoporosis   . Pneumonia     Patient Active Problem List   Diagnosis Date Noted  . Alcohol dependence (HCC) 05/06/2018  . Electrolyte disturbance 05/05/2018  . Alcohol withdrawal delirium (HCC) 04/28/2018  . Weakness generalized 04/27/2018  . Gait abnormality 04/24/2018  . Chronic hypoxemic respiratory failure (HCC) 04/11/2018  . Atrial fibrillation with rapid ventricular response (HCC) 03/23/2018  . Head injury   . Fall 03/19/2018  . Syncope 03/19/2018  . Orbital fracture 03/19/2018  . Scalp laceration 03/19/2018  .  Acute respiratory failure with hypoxia and hypercarbia (HCC) 03/19/2018  . Abdominal tenderness 03/19/2018  . Acute on chronic respiratory failure with hypoxia (HCC) 03/17/2018  . Alcohol abuse with intoxication (HCC) 03/17/2018  . ETOH abuse   . Normocytic anemia 03/16/2018  . Acute metabolic encephalopathy 03/16/2018  . Lower leg edema 03/16/2018  . Migraine 03/16/2018  . Community acquired pneumonia of left lower lobe of lung (HCC)   . Weakness 08/21/2017  . COPD exacerbation (HCC) 06/12/2017  . Chest pain with moderate risk for cardiac etiology 05/30/2017  . Abnormal LFTs 05/30/2017  . Tachycardia 05/30/2017  . COPD (chronic obstructive pulmonary disease) (HCC) 04/19/2017  . Right rib fracture 04/15/2017  . Abdominal wall cellulitis 12/30/2016  . S/P exploratory laparotomy 12/30/2016  . Distal radius fracture, left 09/09/2016  . Thrombocytopenia (HCC) 09/09/2016  . Chest x-ray abnormality   . Umbilical hernia without obstruction and without gangrene 05/12/2016  . Itching 02/24/2016  . Ventral hernia without obstruction or gangrene 02/24/2016  . Palliative care encounter   . Ascites   . Tachypnea   . Atrial flutter with rapid ventricular response (HCC)   . Hypomagnesemia   . Hypoxia   . Dehydration 01/22/2016  . Low back ache 12/30/2015  . Non-intractable vomiting with nausea   . Alcohol abuse with alcohol-induced mood disorder (HCC) 12/15/2015  . Acute alcoholism (HCC)   .  Abdominal distension 11/04/2015  . Alcohol abuse   . Dyspnea   . Acute respiratory failure (HCC) 10/15/2015  . Ascites due to alcoholic cirrhosis (HCC) 10/15/2015  . Osteoporosis   . Multiple sclerosis (HCC)   . Alcoholic cirrhosis of liver with ascites (HCC) 08/22/2015  . Bipolar disorder, current episode mixed, moderate (HCC) 08/01/2015  . Alcohol use disorder, severe, dependence (HCC) 07/31/2015  . Macrocytic anemia- due to alcohol abuse with normal B12 & folate levels 05/31/2015  . Severe  protein-calorie malnutrition (HCC) 05/31/2015  . Hypokalemia 05/26/2015  . Encephalopathy, hepatic (HCC) 05/26/2015  . Alcohol withdrawal (HCC) 05/18/2015  . Abdominal pain 05/18/2015  . Anxiety 05/18/2015  . Stimulant abuse (HCC) 12/27/2014  . Nicotine abuse 12/27/2014  . Hyperprolactinemia (HCC) 11/15/2014  . Dyslipidemia   . Alcohol use disorder, moderate, dependence (HCC) 10/30/2014  . Tobacco abuse 01/23/2014  . Bipolar 1 disorder (HCC) 04/07/2013  . Noncompliance with therapeutic plan 04/04/2013  . Hereditary and idiopathic peripheral neuropathy 05/01/2012  . Depression 05/01/2012  . GERD (gastroesophageal reflux disease) 05/01/2012  . Osteoarthrosis, unspecified whether generalized or localized, involving lower leg 05/01/2012  . Hyponatremia 07/01/2011    Past Surgical History:  Procedure Laterality Date  . ABDOMINAL WALL DEFECT REPAIR N/A 12/30/2016   Procedure: EXPLORATORY LAPAROTOMY WITH REPAIR ABDOMINAL WALL VENTRAL HERNIA;  Surgeon: [name], MD;  Location: [facility] OR;  Service: General;  Laterality: N/A;  . APPLICATION OF WOUND VAC N/A 12/30/2016   Procedure: APPLICATION OF WOUND VAC;  Surgeon: [name], MD;  Location: [facility] OR;  Service: General;  Laterality: N/A;  . CESAREAN SECTION  [id number]  . FRACTURE SURGERY    . HERNIA REPAIR    . MYRINGOTOMY WITH TUBE PLACEMENT Bilateral   . ORIF WRIST FRACTURE Left 09/09/2016   Procedure: OPEN REDUCTION INTERNAL FIXATION (ORIF) LEFT WRIST FRACTURE, LEFT CARPAL TUNNEL RELEASE;  Surgeon: Dominica Severin, MD;  Location: [facility] ORS;  Service: Orthopedics;  Laterality: Left;  . TONSILLECTOMY       OB History   No obstetric history on file.      Home Medications    Prior to Admission medications   Medication Sig Start Date End Date Taking? Authorizing Provider  acetaminophen (TYLENOL) 500 MG tablet Take 1 tablet (500 mg total) by mouth every 6 (six) hours as needed for mild pain. Patient taking differently: Take  1,000 mg by mouth every 6 (six) hours as needed for mild pain.  03/28/18   Rolly Salter, MD  chlordiazePOXIDE (LIBRIUM) 25 MG capsule 25mg  PO TID x 1D, then 25mg  PO BID X 1D, then 25mg  PO QD X 1D Patient not taking: Reported on 05/30/2018 05/09/18   Petrucelli, Pleas Koch, PA-C  chlordiazePOXIDE (LIBRIUM) 25 MG capsule 50mg  PO TID x 1D, then 25-50mg  PO BID X 1D, then 25-50mg  PO QD X 1D Patient not taking: Reported on 05/30/2018 05/19/18   Lorre Nick, MD  diltiazem (CARDIZEM CD) 120 MG 24 hr capsule Take 1 capsule (120 mg total) by mouth daily for 30 days. 04/14/18 05/30/18  Tegeler, Canary Brim, MD  furosemide (LASIX) 40 MG tablet Take 40 mg twice daily till 04/02/2018, after that take 40 mg daily. Patient taking differently: Take 40 mg by mouth daily. Take 40 mg twice daily till 04/02/2018, after that take 40 mg daily. 04/14/18   Tegeler, Canary Brim, MD  gabapentin (NEURONTIN) 100 MG capsule Take 2 capsules (200 mg total) by mouth 3 (three) times daily for 30 days. 05/19/18 06/18/18  Lorre Nick,  MD  hydrOXYzine (ATARAX/VISTARIL) 25 MG tablet Take 1 tablet (25 mg total) by mouth daily as needed for up to 30 doses (anxiety). Patient taking differently: Take 25 mg by mouth daily as needed for anxiety (withdrawal).  04/14/18   Tegeler, Canary Brim, MD  ipratropium-albuterol (DUONEB) 0.5-2.5 (3) MG/3ML SOLN Take 3 mLs by nebulization 2 (two) times daily. AND every 4 hours as needed for wheezing/shortness of breath    [provider]  lidocaine (LIDODERM) 5 % Place 1 patch onto the skin daily. Remove & Discard patch within 12 hours or as directed by MD 05/03/18   Henderly, Britni A, PA-C  magnesium oxide (MAG-OX) 400 MG tablet Take 400 mg by mouth daily.    [provider]  ondansetron (ZOFRAN ODT) 8 MG disintegrating tablet Take 1 tablet (8 mg total) by mouth every 8 (eight) hours as needed for nausea or vomiting. Patient not taking: Reported on 05/30/2018 05/19/18   Lorre Nick, MD   ondansetron (ZOFRAN) 4 MG tablet Take 1 tablet (4 mg total) by mouth every 8 (eight) hours as needed for nausea or vomiting. 05/15/18   Mesner, Barbara Cower, MD  pantoprazole (PROTONIX) 40 MG tablet Take 1 tablet (40 mg total) by mouth daily for 30 days. 04/14/18 05/30/18  Tegeler, Canary Brim, MD  potassium chloride SA (K-DUR,KLOR-CON) 20 MEQ tablet Take 2 tablets (40 mEq total) by mouth 2 (two) times daily. 04/19/18   Bethel Born, PA-C  rizatriptan (MAXALT) 10 MG tablet Take 10 mg by mouth as needed for migraine. May repeat in 2 hours if needed    [provider]  rizatriptan (MAXALT) 5 MG tablet Take 1 tablet (5 mg total) by mouth as needed for migraine. May repeat in 2 hours if needed; not to exceed  in 24 hours 05/19/18   Lorre Nick, MD  spironolactone (ALDACTONE) 25 MG tablet Take 1 tablet (25 mg total) by mouth daily for 30 days. 04/14/18 05/30/18  Tegeler, Canary Brim, MD    Family History Family History  Problem Relation Age of Onset  . Arrhythmia Mother   . Neuropathy Mother   . Coronary artery disease Mother   . Heart disease Mother   . Heart disease Father   . Hypertension Father   . Heart attack Father   . Multiple sclerosis Maternal Aunt     Social History Social History   Tobacco Use  . Smoking status: Current Every Day Smoker    Packs/day: 1.00    Years: 36.00    Pack years: 36.00    Types: Cigarettes  . Smokeless tobacco: Never Used  Substance Use Topics  . Alcohol use: Yes    Comment: Chronic ETOH abuse  . Drug use: No     Allergies   Patient has no known allergies.   Review of Systems Review of Systems  All other systems reviewed and are negative.    Physical Exam Updated Vital Signs SpO2 98% Comment: RA  Physical Exam Vitals signs and nursing note reviewed.  Constitutional:      General: She is not in acute distress.    Appearance: Normal appearance. She is well-developed. She is not toxic-appearing.  HENT:     Head:  Normocephalic and atraumatic.  Eyes:     General: Lids are normal.     Conjunctiva/sclera: Conjunctivae normal.     Pupils: Pupils are equal, round, and reactive to light.  Neck:     Musculoskeletal: Normal range of motion and neck supple.  Thyroid: No thyroid mass.     Trachea: No tracheal deviation.  Cardiovascular:     Rate and Rhythm: Normal rate and regular rhythm.     Heart sounds: Normal heart sounds. No murmur. No gallop.   Pulmonary:     Effort: Pulmonary effort is normal. No respiratory distress.     Breath sounds: Normal breath sounds. No stridor. No decreased breath sounds, wheezing, rhonchi or rales.  Abdominal:     General: Abdomen is protuberant. There is distension.     Palpations: There is fluid wave.     Tenderness: There is generalized abdominal tenderness. There is no guarding or rebound.     Hernia: There is no hernia in the umbilical area.  Musculoskeletal: Normal range of motion.        General: No tenderness.  Skin:    General: Skin is warm and dry.     Findings: No abrasion or rash.  Neurological:     Mental Status: She is alert and oriented to person, place, and time.     GCS: GCS eye subscore is 4. GCS verbal subscore is 5. GCS motor subscore is 6.     Cranial Nerves: No cranial nerve deficit.     Sensory: No sensory deficit.  Psychiatric:        Speech: Speech normal.        Behavior: Behavior normal.      ED Treatments / Results  Labs (all labs ordered are listed, but only abnormal results are displayed) Labs Reviewed  CBC WITH DIFFERENTIAL/PLATELET  COMPREHENSIVE METABOLIC PANEL  LIPASE, BLOOD  URINALYSIS, ROUTINE W REFLEX MICROSCOPIC  ETHANOL  RAPID URINE DRUG SCREEN, HOSP PERFORMED    EKG None  Radiology No results found.  Procedures Procedures (including critical care time)  Medications Ordered in ED Medications  0.9 %  sodium chloride infusion (has no administration in time range)  ondansetron (ZOFRAN) injection 4 mg  (has no administration in time range)     Initial Impression / Assessment and Plan / ED Course  I have reviewed the triage vital signs and the nursing notes.  Pertinent labs & imaging results that were available during my care of the patient were reviewed by me and considered in my medical decision making (see chart for details).        Patient refused x-rays at this time.  Was explained to the patient that there could be serious pathology including bowel obstruction that will be managed without the x-rays.  She accepts this risk.  She had first wanted to leave AMA I convinced her to stay and wait for her blood work.  She has now changed her mind and wants to leave AMA.  Patient has a history of this the risk of sudden death was explained to her as well as other consequences and she accepts this.  She has capacity make this decision.  She does not appear to be intoxicated.  Final Clinical Impressions(s) / ED Diagnoses   Final diagnoses:  None    ED Discharge Orders    None       Lorre Nick, MD 06/11/18 2235

## 2018-06-11 NOTE — ED Notes (Signed)
Bed: BM84 Expected date:  Expected time:  Means of arrival:  Comments: 58 yo F/ NVD

## 2018-06-11 NOTE — ED Notes (Signed)
Patient refused x-rays after being transported over to radiology. EDP notified and spoke to pt with writer present. Writer witnessed pt continue to verbally refuse x-rays even after EDP explained why x-rays were necessary to rule out emergent conditions. Patient has agreed to wait on results of lab work.

## 2018-06-11 NOTE — ED Triage Notes (Signed)
Patient BIB [facility] from home for n/v/d abdominal pain and abdominal distention that started today.

## 2018-06-13 ENCOUNTER — Inpatient Hospital Stay (HOSPITAL_COMMUNITY)
Admission: EM | Admit: 2018-06-13 | Discharge: 2018-06-21 | DRG: 309 | Disposition: A | Discharge status: Home Health | Payer: Medicare Other | Attending: Family Medicine | Admitting: Family Medicine

## 2018-06-13 ENCOUNTER — Emergency Department (HOSPITAL_COMMUNITY): Payer: Medicare Other

## 2018-06-13 ENCOUNTER — Other Ambulatory Visit: Payer: Self-pay

## 2018-06-13 DIAGNOSIS — Y907 Blood alcohol level of 200-239 mg/100 ml: Secondary | ICD-10-CM | POA: Diagnosis present

## 2018-06-13 DIAGNOSIS — Z6838 Body mass index (BMI) 38.0-38.9, adult: Secondary | ICD-10-CM | POA: Diagnosis not present

## 2018-06-13 DIAGNOSIS — K7031 Alcoholic cirrhosis of liver with ascites: Secondary | ICD-10-CM | POA: Diagnosis present

## 2018-06-13 DIAGNOSIS — F10239 Alcohol dependence with withdrawal, unspecified: Secondary | ICD-10-CM | POA: Diagnosis present

## 2018-06-13 DIAGNOSIS — K219 Gastro-esophageal reflux disease without esophagitis: Secondary | ICD-10-CM | POA: Diagnosis present

## 2018-06-13 DIAGNOSIS — F909 Attention-deficit hyperactivity disorder, unspecified type: Secondary | ICD-10-CM | POA: Diagnosis present

## 2018-06-13 DIAGNOSIS — Z820 Family history of epilepsy and other diseases of the nervous system: Secondary | ICD-10-CM | POA: Diagnosis not present

## 2018-06-13 DIAGNOSIS — F1020 Alcohol dependence, uncomplicated: Secondary | ICD-10-CM | POA: Diagnosis present

## 2018-06-13 DIAGNOSIS — I480 Paroxysmal atrial fibrillation: Principal | ICD-10-CM | POA: Diagnosis present

## 2018-06-13 DIAGNOSIS — Z1159 Encounter for screening for other viral diseases: Secondary | ICD-10-CM | POA: Diagnosis not present

## 2018-06-13 DIAGNOSIS — F319 Bipolar disorder, unspecified: Secondary | ICD-10-CM | POA: Diagnosis present

## 2018-06-13 DIAGNOSIS — D61818 Other pancytopenia: Secondary | ICD-10-CM | POA: Diagnosis present

## 2018-06-13 DIAGNOSIS — R451 Restlessness and agitation: Secondary | ICD-10-CM | POA: Diagnosis present

## 2018-06-13 DIAGNOSIS — J9611 Chronic respiratory failure with hypoxia: Secondary | ICD-10-CM | POA: Diagnosis present

## 2018-06-13 DIAGNOSIS — F17200 Nicotine dependence, unspecified, uncomplicated: Secondary | ICD-10-CM | POA: Diagnosis present

## 2018-06-13 DIAGNOSIS — G35 Multiple sclerosis: Secondary | ICD-10-CM | POA: Diagnosis present

## 2018-06-13 DIAGNOSIS — J449 Chronic obstructive pulmonary disease, unspecified: Secondary | ICD-10-CM | POA: Diagnosis present

## 2018-06-13 DIAGNOSIS — F419 Anxiety disorder, unspecified: Secondary | ICD-10-CM | POA: Diagnosis present

## 2018-06-13 DIAGNOSIS — M810 Age-related osteoporosis without current pathological fracture: Secondary | ICD-10-CM | POA: Diagnosis present

## 2018-06-13 DIAGNOSIS — Y92009 Unspecified place in unspecified non-institutional (private) residence as the place of occurrence of the external cause: Secondary | ICD-10-CM | POA: Diagnosis not present

## 2018-06-13 DIAGNOSIS — E669 Obesity, unspecified: Secondary | ICD-10-CM | POA: Diagnosis present

## 2018-06-13 DIAGNOSIS — Z8249 Family history of ischemic heart disease and other diseases of the circulatory system: Secondary | ICD-10-CM | POA: Diagnosis not present

## 2018-06-13 DIAGNOSIS — D696 Thrombocytopenia, unspecified: Secondary | ICD-10-CM | POA: Diagnosis present

## 2018-06-13 DIAGNOSIS — R188 Other ascites: Secondary | ICD-10-CM

## 2018-06-13 DIAGNOSIS — E876 Hypokalemia: Secondary | ICD-10-CM | POA: Diagnosis present

## 2018-06-13 DIAGNOSIS — W08XXXA Fall from other furniture, initial encounter: Secondary | ICD-10-CM | POA: Diagnosis present

## 2018-06-13 DIAGNOSIS — Z79899 Other long term (current) drug therapy: Secondary | ICD-10-CM

## 2018-06-13 DIAGNOSIS — Z720 Tobacco use: Secondary | ICD-10-CM | POA: Diagnosis present

## 2018-06-13 DIAGNOSIS — I4891 Unspecified atrial fibrillation: Secondary | ICD-10-CM | POA: Diagnosis present

## 2018-06-13 DIAGNOSIS — I4892 Unspecified atrial flutter: Secondary | ICD-10-CM | POA: Diagnosis present

## 2018-06-13 LAB — CBC WITH DIFFERENTIAL/PLATELET
Abs Immature Granulocytes: 0.01 10*3/uL (ref 0.00–0.07)
Basophils Absolute: 0.1 10*3/uL (ref 0.0–0.1)
Basophils Relative: 1 %
Eosinophils Absolute: 0 10*3/uL (ref 0.0–0.5)
Eosinophils Relative: 0 %
HCT: 32.1 % — ABNORMAL LOW (ref 36.0–46.0)
Hemoglobin: 9.2 g/dL — ABNORMAL LOW (ref 12.0–15.0)
Immature Granulocytes: 0 %
Lymphocytes Relative: 16 %
Lymphs Abs: 0.6 10*3/uL — ABNORMAL LOW (ref 0.7–4.0)
MCH: 23.2 pg — ABNORMAL LOW (ref 26.0–34.0)
MCHC: 28.7 g/dL — ABNORMAL LOW (ref 30.0–36.0)
MCV: 81.1 fL (ref 80.0–100.0)
Monocytes Absolute: 0.4 10*3/uL (ref 0.1–1.0)
Monocytes Relative: 12 %
Neutro Abs: 2.6 10*3/uL (ref 1.7–7.7)
Neutrophils Relative %: 71 %
Platelets: 118 10*3/uL — ABNORMAL LOW (ref 150–400)
RBC: 3.96 MIL/uL (ref 3.87–5.11)
RDW: 23.5 % — ABNORMAL HIGH (ref 11.5–15.5)
WBC: 3.7 10*3/uL — ABNORMAL LOW (ref 4.0–10.5)
nRBC: 0 % (ref 0.0–0.2)

## 2018-06-13 LAB — COMPREHENSIVE METABOLIC PANEL
ALT: 27 U/L (ref 0–44)
AST: 62 U/L — ABNORMAL HIGH (ref 15–41)
Albumin: 2.7 g/dL — ABNORMAL LOW (ref 3.5–5.0)
Alkaline Phosphatase: 149 U/L — ABNORMAL HIGH (ref 38–126)
Anion gap: 10 (ref 5–15)
BUN: 7 mg/dL (ref 6–20)
CO2: 29 mmol/L (ref 22–32)
Calcium: 8 mg/dL — ABNORMAL LOW (ref 8.9–10.3)
Chloride: 94 mmol/L — ABNORMAL LOW (ref 98–111)
Creatinine, Ser: 0.7 mg/dL (ref 0.44–1.00)
GFR calc Af Amer: >60 mL/min (ref >60)
GFR calc non Af Amer: >60 mL/min (ref >60)
Glucose, Bld: 101 mg/dL — ABNORMAL HIGH (ref 70–99)
Potassium: 3.4 mmol/L — ABNORMAL LOW (ref 3.5–5.1)
Sodium: 133 mmol/L — ABNORMAL LOW (ref 135–145)
Total Bilirubin: 3.2 mg/dL — ABNORMAL HIGH (ref 0.3–1.2)
Total Protein: 6.5 g/dL (ref 6.5–8.1)

## 2018-06-13 LAB — AMMONIA: Ammonia: 28 umol/L (ref 9–35)

## 2018-06-13 LAB — URINALYSIS, ROUTINE W REFLEX MICROSCOPIC
Bilirubin Urine: NEGATIVE
Glucose, UA: NEGATIVE mg/dL
Hgb urine dipstick: NEGATIVE
Ketones, ur: NEGATIVE mg/dL
Leukocytes,Ua: NEGATIVE
Nitrite: NEGATIVE
Protein, ur: NEGATIVE mg/dL
Specific Gravity, Urine: 1.006 (ref 1.005–1.030)
pH: 6 (ref 5.0–8.0)

## 2018-06-13 LAB — CK: Total CK: 595 U/L — ABNORMAL HIGH (ref 38–234)

## 2018-06-13 LAB — PROTIME-INR
INR: 1.5 — ABNORMAL HIGH (ref 0.8–1.2)
Prothrombin Time: 17.5 seconds — ABNORMAL HIGH (ref 11.4–15.2)

## 2018-06-13 LAB — APTT: aPTT: 31 seconds (ref 24–36)

## 2018-06-13 LAB — LIPASE, BLOOD: Lipase: 28 U/L (ref 11–51)

## 2018-06-13 MED ORDER — DILTIAZEM HCL 30 MG PO TABS
30.0000 mg | ORAL_TABLET | Freq: Once | ORAL | Status: AC
  Administered 2018-06-13: 30 mg via ORAL
  Filled 2018-06-13: qty 1

## 2018-06-13 MED ORDER — THIAMINE HCL 100 MG/ML IJ SOLN
100.0000 mg | Freq: Every day | INTRAMUSCULAR | Status: DC
  Administered 2018-06-13 – 2018-06-16 (×2): 100 mg via INTRAVENOUS
  Filled 2018-06-13 (×3): qty 2

## 2018-06-13 MED ORDER — LORAZEPAM 2 MG/ML IJ SOLN: 0.0000 mg | Freq: Two times a day (BID) | INTRAMUSCULAR | Status: DC

## 2018-06-13 MED ORDER — LORAZEPAM 1 MG PO TABS: 0.0000 mg | ORAL_TABLET | Freq: Two times a day (BID) | ORAL | Status: DC

## 2018-06-13 MED ORDER — LORAZEPAM 1 MG PO TABS: 0.0000 mg | ORAL_TABLET | Freq: Four times a day (QID) | ORAL | Status: DC

## 2018-06-13 MED ORDER — LORAZEPAM 2 MG/ML IJ SOLN
0.0000 mg | Freq: Four times a day (QID) | INTRAMUSCULAR | Status: DC
  Administered 2018-06-13: 2 mg via INTRAVENOUS
  Filled 2018-06-13: qty 1

## 2018-06-13 MED ORDER — DILTIAZEM LOAD VIA INFUSION
15.0000 mg | Freq: Once | INTRAVENOUS | Status: AC
  Administered 2018-06-14: 15 mg via INTRAVENOUS
  Filled 2018-06-13: qty 15

## 2018-06-13 MED ORDER — DILTIAZEM HCL 100 MG IV SOLR
5.0000 mg/h | INTRAVENOUS | Status: DC
  Administered 2018-06-14: 8.5 mg/h via INTRAVENOUS
  Administered 2018-06-14: 5 mg/h via INTRAVENOUS
  Filled 2018-06-13 (×2): qty 100

## 2018-06-13 MED ORDER — SODIUM CHLORIDE 0.9 % IV BOLUS
1000.0000 mL | Freq: Once | INTRAVENOUS | Status: AC
  Administered 2018-06-13: 1000 mL via INTRAVENOUS

## 2018-06-13 MED ORDER — VITAMIN B-1 100 MG PO TABS
100.0000 mg | ORAL_TABLET | Freq: Every day | ORAL | Status: DC
  Administered 2018-06-14 – 2018-06-21 (×7): 100 mg via ORAL
  Filled 2018-06-13 (×7): qty 1

## 2018-06-13 NOTE — ED Triage Notes (Signed)
Per EMS, Pt had a fall last night, apprx 8-9pm, and unable to get up. Daughter of pt discovered pt around 6pm today and called EMS. No obvious sign of injury from fall and no complaints of pain from fall. Pt complaining of left sided abd pain, for one week. Abd distention noted.

## 2018-06-13 NOTE — ED Notes (Signed)
IV team nurse unable to find a sufficient vein to place IV even with ultrasound guidance. Lab called for collection of blood.

## 2018-06-13 NOTE — H&P (Signed)
History and Physical    Anne Black OFB:[id number] DOB: 1960-07-21 DOA: 06/13/2018  PCP: Patient, No Pcp Per  Patient coming from: Home via EMS  I have personally briefly reviewed patient's old medical records in [place]  Chief Complaint: Fall at home  HPI: Anne Black is a 58 y.o. female with medical history significant for paroxysmal atrial fibrillation not on anticoagulation, alcohol dependence with alcoholic liver cirrhosis, COPD on 2 L O2, MS, bipolar disorder, depression, and tobacco use who presents to the ED after a fall at home.  Patient is able to provide limited history although not fully detailed as she is quite somnolent.  Supplemental history is obtained from EDP and chart review.  Patient states she was sitting down at home last night on 06/13/2018 when she fell onto the floor landing on her back.  She was unable to get up and was found by her daughter around 6 PM today who called EMS.  Patient does not provide clear details surrounding the events of her fall, however reports generalized weakness and increasing abdominal distention.  She denies any loss of consciousness, injury to her head, abdominal pain, typical chest pain, dyspnea, dysuria, or diarrhea.  She says she has been able to take her usual medications regularly.  She has a history of chronic alcohol use and reports last drink 2 days ago.  She reports a history of alcohol withdrawal.  She denies any significant obvious bleeding.  ED Course:  Initial vitals showed BP 116/71, pulse 141, RR 16, temp 97.6 Fahrenheit, SPO2 93% on room air.  Labs are notable for WBC 3.7, hemoglobin 9.2, platelets 118, sodium 133, potassium 3.4, BUN 7, creatinine 0.7, AST 62, ALT 27, alk phos 149, total bilirubin 3.2, lipase 28, INR 1.5, CK 595, ammonia 28, UDS positive for benzodiazepines (collected after receiving benzo in ED), urinalysis negative for UTI.  CT head without contrast showed no evidence of acute intracranial  abnormality.  Atrophy and small vessel ischemic changes of the white matter was seen.  IV access was obtained by the EDP. Patient was given 1 L normal saline, IV Ativan, and oral diltiazem 30 mg with persistent tachycardia in the 140s.  She was subsequently placed on a diltiazem drip and the hospital service was consulted to admit for further evaluation and management.  Review of Systems: As per HPI otherwise 10 point review of systems negative.    Past Medical History:  Diagnosis Date   ADHD (attention deficit hyperactivity disorder)    Alcoholic cirrhosis ([place])    with ascites   Alcoholism ([place])    Allergies    Anxiety    Arthritis    Atrial fibrillation with RVR (HCC)    Bipolar disorder (HCC)    COPD (chronic obstructive pulmonary disease) (HCC)    wears 2L chronic O2   Dyspnea    EtOH dependence ([place]) 05/14/2018   GERD (gastroesophageal reflux disease)    History of hiatal hernia    Hypokalemia    Migraine    Multiple sclerosis ([place])    Narcolepsy    Osteoarthritis of knee    Osteoporosis    Pneumonia     Past Surgical History:  Procedure Laterality Date   ABDOMINAL WALL DEFECT REPAIR N/A 12/30/2016   Procedure: EXPLORATORY LAPAROTOMY WITH REPAIR ABDOMINAL WALL VENTRAL HERNIA;  Surgeon: Rolm Bookbinder, MD;  Location: [facility];  Service: General;  Laterality: N/A;   APPLICATION OF WOUND VAC N/A 12/30/2016   Procedure: APPLICATION OF WOUND  VAC;  Surgeon: Rolm Bookbinder, MD;  Location: [facility];  Service: General;  Laterality: N/A;   CESAREAN SECTION  [phone]   FRACTURE SURGERY     HERNIA REPAIR     MYRINGOTOMY WITH TUBE PLACEMENT Bilateral    ORIF WRIST FRACTURE Left 09/09/2016   Procedure: OPEN REDUCTION INTERNAL FIXATION (ORIF) LEFT WRIST FRACTURE, LEFT CARPAL TUNNEL RELEASE;  Surgeon: Roseanne Kaufman, MD;  Location: [facility];  Service: Orthopedics;  Laterality: Left;   TONSILLECTOMY       reports that she has been smoking cigarettes.  She has a 36.00 pack-year smoking history. She has never used smokeless tobacco. She reports current alcohol use. She reports that she does not use drugs.  No Known Allergies  Family History  Problem Relation Age of Onset   Arrhythmia Mother    Neuropathy Mother    Coronary artery disease Mother    Heart disease Mother    Heart disease Father    Hypertension Father    Heart attack Father    Multiple sclerosis Maternal Aunt      Prior to Admission medications   Medication Sig Start Date End Date Taking? Authorizing Provider  acetaminophen (TYLENOL) 500 MG tablet Take 1 tablet (500 mg total) by mouth every 6 (six) hours as needed for mild pain. Patient taking differently: Take 1,000 mg by mouth every 6 (six) hours as needed for mild pain.  03/28/18   Lavina Hamman, MD  chlordiazePOXIDE (LIBRIUM) 25 MG capsule 72m PO TID x 1D, then 296mPO BID X 1D, then 2543mO QD X 1D Patient not taking: Reported on 05/30/2018 05/09/18   Petrucelli, Samantha R, PA-C  chlordiazePOXIDE (LIBRIUM) 25 MG capsule 75m80m TID x 1D, then 25-75mg48mBID X 1D, then 25-75mg 54mD X 1D Patient not taking: Reported on 05/30/2018 05/19/18   Allen,Lacretia Leighdiltiazem (CARDIZEM CD) 120 MG 24 hr capsule Take 1 capsule (120 mg total) by mouth daily for 30 days. 04/14/18 05/30/18  Tegeler, ChristGwenyth Allegrafurosemide (LASIX) 40 MG tablet Take 40 mg twice daily till 04/02/2018, after that take 40 mg daily. Patient taking differently: Take 40 mg by mouth daily. Take 40 mg twice daily till 04/02/2018, after that take 40 mg daily. 04/14/18   Tegeler, ChristGwenyth Allegragabapentin (NEURONTIN) 100 MG capsule Take 2 capsules (200 mg total) by mouth 3 (three) times daily for 30 days. 05/19/18 06/18/18  Allen,Lacretia LeighhydrOXYzine (ATARAX/VISTARIL) 25 MG tablet Take 1 tablet (25 mg total) by mouth daily as needed for up to 30 doses (anxiety). Patient taking differently: Take 25 mg by mouth daily as needed for anxiety  (withdrawal).  04/14/18   Tegeler, ChristGwenyth Allegraipratropium-albuterol (DUONEB) 0.5-2.5 (3) MG/3ML SOLN Take 3 mLs by nebulization 2 (two) times daily. AND every 4 hours as needed for wheezing/shortness of breath    [provider]  lidocaine (LIDODERM) 5 % Place 1 patch onto the skin daily. Remove & Discard patch within 12 hours or as directed by MD 05/03/18   Henderly, Britni A, PA-C  magnesium oxide (MAG-OX) 400 MG tablet Take 400 mg by mouth daily.    [provider]  ondansetron (ZOFRAN ODT) 8 MG disintegrating tablet Take 1 tablet (8 mg total) by mouth every 8 (eight) hours as needed for nausea or vomiting. Patient not taking: Reported on 05/30/2018 05/19/18   Allen,Lacretia Leighondansetron (ZOFRAN) 4 MG tablet Take 1 tablet (4 mg  total) by mouth every 8 (eight) hours as needed for nausea or vomiting. 05/15/18   Mesner, Corene Cornea, MD  pantoprazole (PROTONIX) 40 MG tablet Take 1 tablet (40 mg total) by mouth daily for 30 days. 04/14/18 05/30/18  Tegeler, Gwenyth Allegra, MD  potassium chloride SA (K-DUR,KLOR-CON) 20 MEQ tablet Take 2 tablets (40 mEq total) by mouth 2 (two) times daily. 04/19/18   Recardo Evangelist, PA-C  rizatriptan (MAXALT) 10 MG tablet Take 10 mg by mouth as needed for migraine. May repeat in 2 hours if needed    [provider]  rizatriptan (MAXALT) 5 MG tablet Take 1 tablet (5 mg total) by mouth as needed for migraine. May repeat in 2 hours if needed; not to exceed 110m in 24 hours 05/19/18   ALacretia Leigh MD  spironolactone (ALDACTONE) 25 MG tablet Take 1 tablet (25 mg total) by mouth daily for 30 days. 04/14/18 05/30/18  Tegeler, CGwenyth Allegra MD    Physical Exam: Vitals:   06/13/18 1930 06/13/18 1931 06/13/18 2254 06/14/18 0045  BP: 116/71  (!) 125/95 (!) 93/56  Pulse: (!) 141   (!) 131  Resp: 16   16  Temp: 97.6 F (36.4 C)     SpO2: 93%   94%  Weight:  86 kg    Height:  '5\' 2"'  (1.575 m)      Constitutional: Obese woman resting supine in bed,  she is sleepy but awakens to voice and light touch, appears comfortable and in no acute distress Eyes: PERRL, lids and conjunctivae normal ENMT: Mucous membranes are moist. Posterior pharynx clear of any exudate or lesions. Neck: normal, supple, no masses. Respiratory: clear to auscultation anteriorly, no wheezing, no crackles. Normal respiratory effort. No accessory muscle use.  Cardiovascular: Tachycardic, no murmurs / rubs / gallops.  +2 edema bilateral lower extremities.  Abdomen: Distended tense abdomen, nontender to palpation, bowel sounds distant.  Musculoskeletal: no clubbing / cyanosis. No joint deformity upper and lower extremities. No contractures. Normal muscle tone.  Skin: no rashes, lesions, ulcers. No induration Neurologic: CN 2-12 grossly intact. Sensation intact, Strength appears intact however exam limited due to somnolence.  Slight tremor of hands without asterixis. Psychiatric: Somnolent but awakens to voice and light touch.  She is able to tell me her full name and [facility]  She is not able to tell me the current year or month.   Labs on Admission: I have personally reviewed following labs and imaging studies  CBC: Recent Labs  Lab 06/11/18 2138 06/13/18 2002  WBC 4.6 3.7*  NEUTROABS 3.1 2.6  HGB 8.4* 9.2*  HCT 29.9* 32.1*  MCV 81.3 81.1  PLT PLATELET CLUMPS NOTED ON SMEAR, UNABLE TO ESTIMATE 1578   Basic Metabolic Panel: Recent Labs  Lab 06/11/18 2138 06/13/18 2002  NA 132* 133*  K 4.0 3.4*  CL 95* 94*  CO2 29 29  GLUCOSE 100* 101*  BUN 6 7  CREATININE 0.49 0.70  CALCIUM 7.8* 8.0*   GFR: Estimated Creatinine Clearance: 78 mL/min (by C-G formula based on SCr of 0.7 mg/dL). Liver Function Tests: Recent Labs  Lab 06/11/18 2138 06/13/18 2002  AST 44* 62*  ALT 24 27  ALKPHOS 148* 149*  BILITOT 3.1* 3.2*  PROT 6.7 6.5  ALBUMIN 2.7* 2.7*   Recent Labs  Lab 06/11/18 2138 06/13/18 2002  LIPASE 28 28   Recent Labs    Lab 06/13/18 2003  AMMONIA 28   Coagulation Profile: Recent Labs  Lab 06/13/18 2002  INR 1.5*   Cardiac Enzymes: Recent Labs  Lab 06/13/18 2002  CKTOTAL 595*   BNP (last 3 results) No results for input(s): PROBNP in the last 8760 hours. HbA1C: No results for input(s): HGBA1C in the last 72 hours. CBG: No results for input(s): GLUCAP in the last 168 hours. Lipid Profile: No results for input(s): CHOL, HDL, LDLCALC, TRIG, CHOLHDL, LDLDIRECT in the last 72 hours. Thyroid Function Tests: No results for input(s): TSH, T4TOTAL, FREET4, T3FREE, THYROIDAB in the last 72 hours. Anemia Panel: No results for input(s): VITAMINB12, FOLATE, FERRITIN, TIBC, IRON, RETICCTPCT in the last 72 hours. Urine analysis:    Component Value Date/Time   COLORURINE YELLOW 06/13/2018 2002   APPEARANCEUR CLEAR 06/13/2018 2002   LABSPEC 1.006 06/13/2018 2002   PHURINE 6.0 06/13/2018 2002   GLUCOSEU NEGATIVE 06/13/2018 2002   HGBUR NEGATIVE 06/13/2018 2002   BILIRUBINUR NEGATIVE 06/13/2018 2002   BILIRUBINUR moderate (A) 06/19/2015 1906   KETONESUR NEGATIVE 06/13/2018 2002   PROTEINUR NEGATIVE 06/13/2018 2002   UROBILINOGEN >=8.0 06/19/2015 1906   UROBILINOGEN 0.2 09/14/2011 2141   NITRITE NEGATIVE 06/13/2018 2002   LEUKOCYTESUR NEGATIVE 06/13/2018 2002    Radiological Exams on Admission: Ct Head Wo Contrast  Result Date: 06/13/2018 CLINICAL DATA:  Fall altered mental status EXAM: CT HEAD WITHOUT CONTRAST TECHNIQUE: Contiguous axial images were obtained from the base of the skull through the vertex without intravenous contrast. COMPARISON:  CT brain 05/14/2018, 05/12/2018, 05/09/2018 FINDINGS: Brain: No acute territorial infarction, hemorrhage or intracranial mass. Atrophy and moderate small vessel ischemic changes of the white matter. Stable ventricle size. Vascular: No hyperdense vessels.  Carotid vascular calcification Skull: Normal. Negative for fracture or focal lesion. Sinuses/Orbits: No  acute finding. Mucosal thickening in the maxillary sinuses. Other: None IMPRESSION: 1. No CT evidence for acute intracranial abnormality. 2. Atrophy and small vessel ischemic changes of the white matter Electronically Signed   By: Donavan Foil M.D.   On: 06/13/2018 22:01    EKG: Independently reviewed. 2:1 atrial flutter versus sinus tach, low voltage, QTC 548 rate is faster when compared to prior.  Assessment/Plan Principal Problem:   Atrial fibrillation with RVR (HCC) Active Problems:   Tobacco abuse   Alcohol use disorder, severe, dependence (HCC)   Alcoholic cirrhosis of liver with ascites (HCC)   Thrombocytopenia (HCC)   COPD (chronic obstructive pulmonary disease) (HCC)  Anne Black is a 58 y.o. female with medical history significant for paroxysmal atrial fibrillation not on anticoagulation, alcohol dependence with alcoholic liver cirrhosis, COPD on 2 L O2, MS, bipolar disorder, depression, and tobacco use who is admitted with mild rhabdomyolysis and SVT after a fall at home with prolonged downtime.   SVT/hx of paroxysmal atrial fibrillation with RVR: Patient with persistent tachycardia on ED arrival, initially felt to be in A. fib however review of telemetry appears to be sinus.  Possible occasional SVT.  Patient was started on IV diltiazem drip.  Her CHA2DS2-VASc Score is 1-2 based on gender and ? dCHF (G1DD).  She is not on anticoagulation or aspirin due to her nonadherence and is unclear if the benefits of antiplatelet or anticoagulation therapy outweigh the risks in her case. -Admit to stepdown unit -Continue IV diltiazem drip for now -Will restart oral diltiazem 120 mg daily to wean off the drip  Alcohol use disorder: Patient reports history of alcohol withdrawal and last drink 2 days prior to admission.  She is high risk for withdrawal and delirium tremens therefore we  will continue to monitor in stepdown unit with CIWA protocol.  Hepatic cirrhosis: No evidence of SBP or  hepatic encephalopathy at time of admission although she is high risk for developing this without continued therapy. -Restart home spironolactone and Lasix  Fall at home with prolonged downtime: Unclear events surrounding her fall, however no evidence of traumatic injury.  CT head without contrast is reassuring.  CK is elevated at 595 suggesting mild rhabdomyolysis. -Start maintenance IV fluids overnight -Recheck CK in a.m. -PT/OT eval  Pancytopenia: Hemoglobin and platelets stable compared to recent labs, WBC slightly decreased.  Likely secondary to chronic alcohol use and cirrhosis.  There is no obvious bleeding. -Continue to monitor  COPD: Currently stable without respiratory distress. -Continue supplemental oxygen and albuterol as needed  Mood disorder: Currently appears to be stable, she does not seem to be on medical therapy for this at this time. Continue to monitor.  Multiple sclerosis: Previously followed by [facility] neurology, however has not followed up in about 2 years.  Does not appear to be currently on any medical therapy for this.  No apparent flareup this admission.  Tobacco use: -Nicotine patch ordered   DVT prophylaxis: SCDs Code Status: Full code Family Communication: None present on admission Disposition Plan: Unclear, patient frequently leaves AGAINST MEDICAL ADVICE however agrees to stay tonight as she feels it is reasonable Consults called: None Admission status: Inpatient   Zada Finders MD [facility] Pager [phone]  If 7PM-7AM, please contact night-coverage [email or web address]  06/14/2018, 1:03 AM

## 2018-06-13 NOTE — ED Provider Notes (Signed)
[facility]-EMERGENCY DEPT Provider Note   CSN: [id number] Arrival date & time: 06/13/18  1913    History   Chief Complaint Chief Complaint  Patient presents with   Abdominal Pain   Fall    HPI Anne Black is a 58 y.o. female.     HPI  58 year old female with history of A. fib with RVR, alcoholism, liver cirrhosis and MS comes in with chief complaint of fall.   According to the patient she fell last night.  She was unable to get up and was finally discovered by her daughter at 44 PM today, who proceeded to call EMS.  Patient states that she simply did not have energy to get up.   Patient complains of pain all over her body.  She denies severe headaches, focal numbness, tingling, weakness, vision change.  She denies any abdominal pain to me and reports no UTI-like symptoms, nausea, vomiting, chills, diarrhea.  Past Medical History:  Diagnosis Date   ADHD (attention deficit hyperactivity disorder)    Alcoholic cirrhosis (HCC)    with ascites   Alcoholism (HCC)    Allergies    Anxiety    Arthritis    Atrial fibrillation with RVR (HCC)    Bipolar disorder (HCC)    COPD (chronic obstructive pulmonary disease) (HCC)    wears 2L chronic O2   Dyspnea    EtOH dependence (HCC) 05/14/2018   GERD (gastroesophageal reflux disease)    History of hiatal hernia    Hypokalemia    Migraine    Multiple sclerosis (HCC)    Narcolepsy    Osteoarthritis of knee    Osteoporosis    Pneumonia     Patient Active Problem List   Diagnosis Date Noted   Atrial fibrillation with RVR (HCC) 06/13/2018   Alcohol dependence (HCC) 05/06/2018   Electrolyte disturbance 05/05/2018   Alcohol withdrawal delirium (HCC) 04/28/2018   Weakness generalized 04/27/2018   Gait abnormality 04/24/2018   Chronic hypoxemic respiratory failure (HCC) 04/11/2018   Atrial fibrillation with rapid ventricular response (HCC) 03/23/2018   Head injury    Fall  03/19/2018   Syncope 03/19/2018   Orbital fracture 03/19/2018   Scalp laceration 03/19/2018   Acute respiratory failure with hypoxia and hypercarbia (HCC) 03/19/2018   Abdominal tenderness 03/19/2018   Acute on chronic respiratory failure with hypoxia (HCC) 03/17/2018   Alcohol abuse with intoxication (HCC) 03/17/2018   ETOH abuse    Normocytic anemia 03/16/2018   Acute metabolic encephalopathy 03/16/2018   Lower leg edema 03/16/2018   Migraine 03/16/2018   Community acquired pneumonia of left lower lobe of lung (HCC)    Weakness 08/21/2017   COPD exacerbation (HCC) 06/12/2017   Chest pain with moderate risk for cardiac etiology 05/30/2017   Abnormal LFTs 05/30/2017   Tachycardia 05/30/2017   COPD (chronic obstructive pulmonary disease) (HCC) 04/19/2017   Right rib fracture 04/15/2017   Abdominal wall cellulitis 12/30/2016   S/P exploratory laparotomy 12/30/2016   Distal radius fracture, left 09/09/2016   Thrombocytopenia (HCC) 09/09/2016   Chest x-ray abnormality    Umbilical hernia without obstruction and without gangrene 05/12/2016   Itching 02/24/2016   Ventral hernia without obstruction or gangrene 02/24/2016   Palliative care encounter    Ascites    Tachypnea    Atrial flutter with rapid ventricular response (HCC)    Hypomagnesemia    Hypoxia    Dehydration 01/22/2016   Low back ache 12/30/2015   Non-intractable vomiting with nausea  Alcohol abuse with alcohol-induced mood disorder (HCC) 12/15/2015   Acute alcoholism (HCC)    Abdominal distension 11/04/2015   Alcohol abuse    Dyspnea    Acute respiratory failure (HCC) 10/15/2015   Ascites due to alcoholic cirrhosis (HCC) 10/15/2015   Osteoporosis    Multiple sclerosis (HCC)    Alcoholic cirrhosis of liver with ascites (HCC) 08/22/2015   Bipolar disorder, current episode mixed, moderate (HCC) 08/01/2015   Alcohol use disorder, severe, dependence (HCC) 07/31/2015     Macrocytic anemia- due to alcohol abuse with normal B12 & folate levels 05/31/2015   Severe protein-calorie malnutrition (HCC) 05/31/2015   Hypokalemia 05/26/2015   Encephalopathy, hepatic (HCC) 05/26/2015   Alcohol withdrawal (HCC) 05/18/2015   Abdominal pain 05/18/2015   Anxiety 05/18/2015   Stimulant abuse (HCC) 12/27/2014   Nicotine abuse 12/27/2014   Hyperprolactinemia (HCC) 11/15/2014   Dyslipidemia    Alcohol use disorder, moderate, dependence (HCC) 10/30/2014   Tobacco abuse 01/23/2014   Bipolar 1 disorder (HCC) 04/07/2013   Noncompliance with therapeutic plan 04/04/2013   Hereditary and idiopathic peripheral neuropathy 05/01/2012   Depression 05/01/2012   GERD (gastroesophageal reflux disease) 05/01/2012   Osteoarthrosis, unspecified whether generalized or localized, involving lower leg 05/01/2012   Hyponatremia 07/01/2011    Past Surgical History:  Procedure Laterality Date   ABDOMINAL WALL DEFECT REPAIR N/A 12/30/2016   Procedure: EXPLORATORY LAPAROTOMY WITH REPAIR ABDOMINAL WALL VENTRAL HERNIA;  Surgeon: [name], MD;  Location: [facility] OR;  Service: General;  Laterality: N/A;   APPLICATION OF WOUND VAC N/A 12/30/2016   Procedure: APPLICATION OF WOUND VAC;  Surgeon: [name], MD;  Location: [facility] OR;  Service: General;  Laterality: N/A;   CESAREAN SECTION  [phone]   FRACTURE SURGERY     HERNIA REPAIR     MYRINGOTOMY WITH TUBE PLACEMENT Bilateral    ORIF WRIST FRACTURE Left 09/09/2016   Procedure: OPEN REDUCTION INTERNAL FIXATION (ORIF) LEFT WRIST FRACTURE, LEFT CARPAL TUNNEL RELEASE;  Surgeon: Dominica Severin, MD;  Location: [facility] ORS;  Service: Orthopedics;  Laterality: Left;   TONSILLECTOMY       OB History   No obstetric history on file.      Home Medications    Prior to Admission medications   Medication Sig Start Date End Date Taking? Authorizing Provider  acetaminophen (TYLENOL) 500 MG tablet Take 1 tablet (500  mg total) by mouth every 6 (six) hours as needed for mild pain. Patient taking differently: Take 1,000 mg by mouth every 6 (six) hours as needed for mild pain.  03/28/18  Yes Rolly Salter, MD  furosemide (LASIX) 40 MG tablet Take 40 mg twice daily till 04/02/2018, after that take 40 mg daily. Patient taking differently: Take 40 mg by mouth daily. Take 40 mg twice daily till 04/02/2018, after that take 40 mg daily. 04/14/18  Yes Tegeler, Canary Brim, MD  gabapentin (NEURONTIN) 100 MG capsule Take 2 capsules (200 mg total) by mouth 3 (three) times daily for 30 days. 05/19/18 06/18/18 Yes Lorre Nick, MD  magnesium oxide (MAG-OX) 400 MG tablet Take 400 mg by mouth daily.   Yes [provider]  potassium chloride SA (K-DUR,KLOR-CON) 20 MEQ tablet Take 2 tablets (40 mEq total) by mouth 2 (two) times daily. 04/19/18  Yes Bethel Born, PA-C  spironolactone (ALDACTONE) 25 MG tablet Take 1 tablet (25 mg total) by mouth daily for 30 days. 04/14/18 06/14/18 Yes Tegeler, Canary Brim, MD  chlordiazePOXIDE (LIBRIUM) 25 MG capsule 25mg  PO TID x  1D, then  PO BID X 1D, then  PO QD X 1D Patient not taking: Reported on 05/30/2018 05/09/18   Petrucelli, Pleas Koch, PA-C  chlordiazePOXIDE (LIBRIUM) 25 MG capsule  PO TID x 1D, then 25-50mg  PO BID X 1D, then 25-50mg  PO QD X 1D Patient not taking: Reported on 05/30/2018 05/19/18   Lorre Nick, MD  diltiazem (CARDIZEM CD) 120 MG 24 hr capsule Take 1 capsule (120 mg total) by mouth daily for 30 days. 04/14/18 05/30/18  Tegeler, Canary Brim, MD  hydrOXYzine (ATARAX/VISTARIL) 25 MG tablet Take 1 tablet (25 mg total) by mouth daily as needed for up to 30 doses (anxiety). Patient taking differently: Take 25 mg by mouth daily as needed for anxiety (withdrawal).  04/14/18   Tegeler, Canary Brim, MD  lidocaine (LIDODERM) 5 % Place 1 patch onto the skin daily. Remove & Discard patch within 12 hours or as directed by MD Patient not taking: Reported on 06/14/2018  05/03/18   Henderly, Britni A, PA-C  ondansetron (ZOFRAN ODT) 8 MG disintegrating tablet Take 1 tablet (8 mg total) by mouth every 8 (eight) hours as needed for nausea or vomiting. Patient not taking: Reported on 05/30/2018 05/19/18   Lorre Nick, MD  ondansetron (ZOFRAN) 4 MG tablet Take 1 tablet (4 mg total) by mouth every 8 (eight) hours as needed for nausea or vomiting. Patient not taking: Reported on 06/14/2018 05/15/18   Mesner, Barbara Cower, MD  pantoprazole (PROTONIX) 40 MG tablet Take 1 tablet (40 mg total) by mouth daily for 30 days. 04/14/18 05/30/18  Tegeler, Canary Brim, MD  rizatriptan (MAXALT) 5 MG tablet Take 1 tablet (5 mg total) by mouth as needed for migraine. May repeat in 2 hours if needed; not to exceed  in 24 hours Patient not taking: Reported on 06/14/2018 05/19/18   Lorre Nick, MD    Family History Family History  Problem Relation Age of Onset   Arrhythmia Mother    Neuropathy Mother    Coronary artery disease Mother    Heart disease Mother    Heart disease Father    Hypertension Father    Heart attack Father    Multiple sclerosis Maternal Aunt     Social History Social History   Tobacco Use   Smoking status: Current Every Day Smoker    Packs/day: 1.00    Years: 36.00    Pack years: 36.00    Types: Cigarettes   Smokeless tobacco: Never Used  Substance Use Topics   Alcohol use: Yes    Comment: Chronic ETOH abuse   Drug use: No     Allergies   Patient has no known allergies.   Review of Systems Review of Systems  Constitutional: Positive for activity change.  Gastrointestinal: Negative for nausea and vomiting.  Musculoskeletal: Positive for arthralgias and myalgias.  Skin: Negative for rash.  Neurological: Negative for headaches.  All other systems reviewed and are negative.    Physical Exam Updated Vital Signs BP (!) 125/95    Pulse (!) 141    Temp 97.6 F (36.4 C)    Resp 16    Ht  (1.575 m)    Wt 86 kg    SpO2 93%    BMI  34.68 kg/m   Physical Exam Vitals signs and nursing note reviewed.  HENT:     Head: Atraumatic.     Comments: No midline c-spine tenderness, pt able to turn head to 45 degrees bilaterally without any pain and able to flex neck  to the chest and extend without any pain or neurologic symptoms.  Neck:     Musculoskeletal: Normal range of motion and neck supple.  Cardiovascular:     Rate and Rhythm: Normal rate.  Pulmonary:     Effort: Pulmonary effort is normal.  Abdominal:     General: Bowel sounds are normal.     Palpations: Abdomen is soft.     Tenderness: There is generalized abdominal tenderness. There is no guarding or rebound.  Skin:    General: Skin is warm and dry.     Comments: Head to toe evaluation shows no hematoma, bleeding of the scalp, no facial abrasions, no spine step offs, crepitus of the chest or neck, no tenderness to palpation of the bilateral upper and lower extremities, no gross deformities, no chest tenderness, no pelvic pain.   Neurological:     Mental Status: She is alert and oriented to person, place, and time.      ED Treatments / Results  Labs (all labs ordered are listed, but only abnormal results are displayed) Labs Reviewed  COMPREHENSIVE METABOLIC PANEL - Abnormal; Notable for the following components:      Result Value   Sodium 133 (*)    Potassium 3.4 (*)    Chloride 94 (*)    Glucose, Bld 101 (*)    Calcium 8.0 (*)    Albumin 2.7 (*)    AST 62 (*)    Alkaline Phosphatase 149 (*)    Total Bilirubin 3.2 (*)    All other components within normal limits  CBC WITH DIFFERENTIAL/PLATELET - Abnormal; Notable for the following components:   WBC 3.7 (*)    Hemoglobin 9.2 (*)    HCT 32.1 (*)    MCH 23.2 (*)    MCHC 28.7 (*)    RDW 23.5 (*)    Platelets 118 (*)    Lymphs Abs 0.6 (*)    All other components within normal limits  PROTIME-INR - Abnormal; Notable for the following components:   Prothrombin Time 17.5 (*)    INR 1.5 (*)    All  other components within normal limits  CK - Abnormal; Notable for the following components:   Total CK 595 (*)    All other components within normal limits  LIPASE, BLOOD  URINALYSIS, ROUTINE W REFLEX MICROSCOPIC  APTT  AMMONIA  ETHANOL  RAPID URINE DRUG SCREEN, HOSP PERFORMED    EKG EKG Interpretation  Date/Time:  Tuesday Jun 13 2018 22:36:53 EDT Ventricular Rate:  140 PR Interval:    QRS Duration: 89 QT Interval:  359 QTC Calculation: 548 R Axis:   87 Text Interpretation:  aflutter 2:1 vs. sinus tachycardia Low voltage, precordial leads Repol abnrm suggests ischemia, inferior leads Prolonged QT interval besides tachycardia  No significant change since last tracing Confirmed by Derwood Kaplan [phone]) on 06/13/2018 11:23:40 PM   Radiology Ct Head Wo Contrast  Result Date: 06/13/2018 CLINICAL DATA:  Fall altered mental status EXAM: CT HEAD WITHOUT CONTRAST TECHNIQUE: Contiguous axial images were obtained from the base of the skull through the vertex without intravenous contrast. COMPARISON:  CT brain 05/14/2018, 05/12/2018, 05/09/2018 FINDINGS: Brain: No acute territorial infarction, hemorrhage or intracranial mass. Atrophy and moderate small vessel ischemic changes of the white matter. Stable ventricle size. Vascular: No hyperdense vessels.  Carotid vascular calcification Skull: Normal. Negative for fracture or focal lesion. Sinuses/Orbits: No acute finding. Mucosal thickening in the maxillary sinuses. Other: None IMPRESSION: 1. No CT evidence for acute intracranial abnormality.  2. Atrophy and small vessel ischemic changes of the white matter Electronically Signed   By: Jasmine Pang M.D.   On: 06/13/2018 22:01    Procedures .Critical Care Performed by: Derwood Kaplan, MD Authorized by: Derwood Kaplan, MD   Critical care provider statement:    Critical care time (minutes):  43   Critical care was necessary to treat or prevent imminent or life-threatening deterioration of the  following conditions:  Cardiac failure   Critical care was time spent personally by me on the following activities:  Discussions with consultants, evaluation of patient's response to treatment, examination of patient, ordering and performing treatments and interventions, ordering and review of laboratory studies, ordering and review of radiographic studies, pulse oximetry, re-evaluation of patient's condition, obtaining history from patient or surrogate and review of old charts Angiocath insertion Date/Time: 06/14/2018 12:13 AM Performed by: Derwood Kaplan, MD Authorized by: Derwood Kaplan, MD  Consent: Verbal consent obtained. Consent given by: patient Patient understanding: patient states understanding of the procedure being performed Patient identity confirmed: arm band Preparation: Patient was prepped and draped in the usual sterile fashion. Local anesthesia used: no  Anesthesia: Local anesthesia used: no  Sedation: Patient sedated: no  Patient tolerance: Patient tolerated the procedure well with no immediate complications  Angiocath insertion Date/Time: 06/13/2018 10:00 PM Performed by: Derwood Kaplan, MD Authorized by: Derwood Kaplan, MD  Consent: Verbal consent obtained. Patient identity confirmed: arm band     (including critical care time)  Medications Ordered in ED Medications  LORazepam (ATIVAN) injection 0-4 mg (2 mg Intravenous Given 06/13/18 2157)    Or  LORazepam (ATIVAN) tablet 0-4 mg ( Oral See Alternative 06/13/18 2157)  LORazepam (ATIVAN) injection 0-4 mg (has no administration in time range)    Or  LORazepam (ATIVAN) tablet 0-4 mg (has no administration in time range)  thiamine (VITAMIN B-1) tablet 100 mg ( Oral See Alternative 06/13/18 2218)    Or  thiamine (B-1) injection 100 mg (100 mg Intravenous Given 06/13/18 2218)  diltiazem (CARDIZEM) 1 mg/mL load via infusion 15 mg (has no administration in time range)    And  diltiazem (CARDIZEM) 100 mg in dextrose 5  % 100 mL (1 mg/mL) infusion (has no administration in time range)  sodium chloride 0.9 % bolus 1,000 mL (1,000 mLs Intravenous New Bag/Given 06/13/18 2341)  diltiazem (CARDIZEM) tablet 30 mg (30 mg Oral Given 06/13/18 2254)     Initial Impression / Assessment and Plan / ED Course  I have reviewed the triage vital signs and the nursing notes.  Pertinent labs & imaging results that were available during my care of the patient were reviewed by me and considered in my medical decision making (see chart for details).        58 year old female comes in with chief complaint of fall. She had mentioned abdominal pain to the nursing staff, but she informs me that she has no new abdominal pain and that the main reason she is in the ER is because she had a fall and was unable to get up.  Parents trauma is concerned, there is no evidence of any deformities.  Her abdomen is distended and she likely has ascites, but there is no tenderness in any area of her abdomen.   Patient C-spine was cleared clinically.  She is alert and oriented x3.  Plan is to get basic labs and reassess.  Patient states that she was on the floor for several hours before we will get CK as well.  12:15 AM Patient did not respond to oral diltiazem or IM Ativan. Continues to be tachycardic.  Labs are overall reassuring.  We will start her on diltiazem infusion.  Medicine will have to admit her.  This patients CHA2DS2-VASc Score and unadjusted Ischemic Stroke Rate (% per year) is equal to 0.6 % stroke rate/year from a score of 1  Above score calculated as 1 point each if present [CHF, HTN, DM, Vascular=MI/PAD/Aortic Plaque, Age if 65-74, or Female] Above score calculated as 2 points each if present [Age > 75, or Stroke/TIA/TE]   Final Clinical Impressions(s) / ED Diagnoses   Final diagnoses:  Atrial fibrillation with RVR [facility](HCC)    ED Discharge Orders    None       Derwood KaplanNanavati, Dae Antonucci, MD 06/14/18 [phone]

## 2018-06-13 NOTE — ED Notes (Signed)
Bed: QB34 Expected date:  Expected time:  Means of arrival:  Comments: EMS 58 yo ground level fall, distended abd pain, HR 140, 118/60

## 2018-06-13 NOTE — ED Notes (Signed)
2x attempts to place IV, unsuccessful. IV team consult placed.

## 2018-06-14 ENCOUNTER — Encounter (HOSPITAL_COMMUNITY): Payer: Self-pay

## 2018-06-14 ENCOUNTER — Inpatient Hospital Stay (HOSPITAL_COMMUNITY): Payer: Medicare Other

## 2018-06-14 DIAGNOSIS — F1020 Alcohol dependence, uncomplicated: Secondary | ICD-10-CM

## 2018-06-14 DIAGNOSIS — K7031 Alcoholic cirrhosis of liver with ascites: Secondary | ICD-10-CM

## 2018-06-14 DIAGNOSIS — J449 Chronic obstructive pulmonary disease, unspecified: Secondary | ICD-10-CM

## 2018-06-14 DIAGNOSIS — D696 Thrombocytopenia, unspecified: Secondary | ICD-10-CM

## 2018-06-14 DIAGNOSIS — Z720 Tobacco use: Secondary | ICD-10-CM

## 2018-06-14 DIAGNOSIS — E876 Hypokalemia: Secondary | ICD-10-CM

## 2018-06-14 HISTORY — PX: IR PARACENTESIS: IMG2679

## 2018-06-14 LAB — BASIC METABOLIC PANEL
Anion gap: 9 (ref 5–15)
BUN: 7 mg/dL (ref 6–20)
CO2: 27 mmol/L (ref 22–32)
Calcium: 7.3 mg/dL — ABNORMAL LOW (ref 8.9–10.3)
Chloride: 98 mmol/L (ref 98–111)
Creatinine, Ser: 0.5 mg/dL (ref 0.44–1.00)
GFR calc Af Amer: >60 mL/min (ref >60)
GFR calc non Af Amer: >60 mL/min (ref >60)
Glucose, Bld: 105 mg/dL — ABNORMAL HIGH (ref 70–99)
Potassium: 3.2 mmol/L — ABNORMAL LOW (ref 3.5–5.1)
Sodium: 134 mmol/L — ABNORMAL LOW (ref 135–145)

## 2018-06-14 LAB — BODY FLUID CELL COUNT WITH DIFFERENTIAL
Eos, Fluid: 0 %
Lymphs, Fluid: 88 %
Monocyte-Macrophage-Serous Fluid: 12 % — ABNORMAL LOW (ref 50–90)
Neutrophil Count, Fluid: 0 % (ref 0–25)
Total Nucleated Cell Count, Fluid: 132 cu mm (ref 0–1000)

## 2018-06-14 LAB — PROTEIN, PLEURAL OR PERITONEAL FLUID: Total protein, fluid: <3 g/dL

## 2018-06-14 LAB — RAPID URINE DRUG SCREEN, HOSP PERFORMED
Amphetamines: NOT DETECTED
Barbiturates: NOT DETECTED
Benzodiazepines: POSITIVE — AB
Cocaine: NOT DETECTED
Opiates: NOT DETECTED
Tetrahydrocannabinol: NOT DETECTED

## 2018-06-14 LAB — CBC
HCT: 29.2 % — ABNORMAL LOW (ref 36.0–46.0)
Hemoglobin: 8.2 g/dL — ABNORMAL LOW (ref 12.0–15.0)
MCH: 22.9 pg — ABNORMAL LOW (ref 26.0–34.0)
MCHC: 28.1 g/dL — ABNORMAL LOW (ref 30.0–36.0)
MCV: 81.6 fL (ref 80.0–100.0)
Platelets: 103 10*3/uL — ABNORMAL LOW (ref 150–400)
RBC: 3.58 MIL/uL — ABNORMAL LOW (ref 3.87–5.11)
RDW: 23.3 % — ABNORMAL HIGH (ref 11.5–15.5)
WBC: 3.4 10*3/uL — ABNORMAL LOW (ref 4.0–10.5)
nRBC: 0 % (ref 0.0–0.2)

## 2018-06-14 LAB — ALBUMIN, PLEURAL OR PERITONEAL FLUID?

## 2018-06-14 LAB — GLUCOSE, PLEURAL OR PERITONEAL FLUID: Glucose, Fluid: 113 mg/dL

## 2018-06-14 LAB — LACTATE DEHYDROGENASE, PLEURAL OR PERITONEAL FLUID: LD, Fluid: 29 U/L — ABNORMAL HIGH (ref 3–23)

## 2018-06-14 LAB — ALBUMIN, PLEURAL OR PERITONEAL FLUID: Albumin, Fluid: <1 g/dL

## 2018-06-14 LAB — ETHANOL: Alcohol, Ethyl (B): 200 mg/dL — ABNORMAL HIGH (ref <10)

## 2018-06-14 LAB — CK: Total CK: 623 U/L — ABNORMAL HIGH (ref 38–234)

## 2018-06-14 LAB — SARS CORONAVIRUS 2 BY RT PCR (HOSPITAL ORDER, PERFORMED IN ~~LOC~~ HOSPITAL LAB): SARS Coronavirus 2: NEGATIVE

## 2018-06-14 MED ORDER — ACETAMINOPHEN 325 MG PO TABS
650.0000 mg | ORAL_TABLET | Freq: Four times a day (QID) | ORAL | Status: DC | PRN
  Administered 2018-06-14 – 2018-06-16 (×5): 650 mg via ORAL
  Filled 2018-06-14 (×5): qty 2

## 2018-06-14 MED ORDER — ALBUTEROL SULFATE (2.5 MG/3ML) 0.083% IN NEBU: 2.5000 mg | INHALATION_SOLUTION | RESPIRATORY_TRACT | Status: DC | PRN

## 2018-06-14 MED ORDER — SODIUM CHLORIDE 0.9% FLUSH
3.0000 mL | Freq: Two times a day (BID) | INTRAVENOUS | Status: DC
  Administered 2018-06-14 – 2018-06-21 (×10): 3 mL via INTRAVENOUS

## 2018-06-14 MED ORDER — CYCLOBENZAPRINE HCL 5 MG PO TABS
5.0000 mg | ORAL_TABLET | Freq: Three times a day (TID) | ORAL | Status: DC | PRN
  Administered 2018-06-15 – 2018-06-21 (×6): 5 mg via ORAL
  Filled 2018-06-14 (×6): qty 1

## 2018-06-14 MED ORDER — DIPHENHYDRAMINE HCL 50 MG PO CAPS: 50.0000 mg | ORAL_CAPSULE | Freq: Once | ORAL | Status: AC

## 2018-06-14 MED ORDER — ORAL CARE MOUTH RINSE
15.0000 mL | Freq: Two times a day (BID) | OROMUCOSAL | Status: DC
  Administered 2018-06-15 (×2): 15 mL via OROMUCOSAL

## 2018-06-14 MED ORDER — DILTIAZEM HCL ER COATED BEADS 120 MG PO CP24
120.0000 mg | ORAL_CAPSULE | Freq: Every day | ORAL | Status: DC
  Administered 2018-06-14 – 2018-06-21 (×8): 120 mg via ORAL
  Filled 2018-06-14 (×8): qty 1

## 2018-06-14 MED ORDER — CHLORHEXIDINE GLUCONATE CLOTH 2 % EX PADS
6.0000 | MEDICATED_PAD | Freq: Every day | CUTANEOUS | Status: DC
  Administered 2018-06-14 – 2018-06-17 (×4): 6 via TOPICAL

## 2018-06-14 MED ORDER — POTASSIUM CHLORIDE CRYS ER 20 MEQ PO TBCR
40.0000 meq | EXTENDED_RELEASE_TABLET | ORAL | Status: AC
  Administered 2018-06-14 (×2): 40 meq via ORAL
  Filled 2018-06-14 (×2): qty 2

## 2018-06-14 MED ORDER — ALBUTEROL SULFATE HFA 108 (90 BASE) MCG/ACT IN AERS: 1.0000 | INHALATION_SPRAY | RESPIRATORY_TRACT | Status: DC | PRN

## 2018-06-14 MED ORDER — SODIUM CHLORIDE 0.9 % IV SOLN
INTRAVENOUS | Status: DC
  Administered 2018-06-14: 01:00:00 via INTRAVENOUS

## 2018-06-14 MED ORDER — PANTOPRAZOLE SODIUM 40 MG PO TBEC
40.0000 mg | DELAYED_RELEASE_TABLET | Freq: Every day | ORAL | Status: DC
  Administered 2018-06-14 – 2018-06-21 (×8): 40 mg via ORAL
  Filled 2018-06-14 (×8): qty 1

## 2018-06-14 MED ORDER — SPIRONOLACTONE 25 MG PO TABS
25.0000 mg | ORAL_TABLET | Freq: Every day | ORAL | Status: DC
  Administered 2018-06-14: 25 mg via ORAL
  Filled 2018-06-14: qty 1

## 2018-06-14 MED ORDER — DEXTROSE IN LACTATED RINGERS 5 % IV SOLN
INTRAVENOUS | Status: DC
  Administered 2018-06-14 – 2018-06-15 (×2): via INTRAVENOUS

## 2018-06-14 MED ORDER — MORPHINE SULFATE (PF) 2 MG/ML IV SOLN
1.0000 mg | INTRAVENOUS | Status: DC | PRN
  Administered 2018-06-14 – 2018-06-16 (×12): 1 mg via INTRAVENOUS
  Filled 2018-06-14 (×12): qty 1

## 2018-06-14 MED ORDER — LIDOCAINE HCL 1 % IJ SOLN
INTRAMUSCULAR | Status: AC
  Filled 2018-06-14: qty 20

## 2018-06-14 MED ORDER — SODIUM CHLORIDE 0.9% FLUSH
10.0000 mL | Freq: Two times a day (BID) | INTRAVENOUS | Status: DC
  Administered 2018-06-14 – 2018-06-21 (×10): 10 mL

## 2018-06-14 MED ORDER — DIPHENHYDRAMINE HCL 25 MG PO CAPS
25.0000 mg | ORAL_CAPSULE | ORAL | Status: DC | PRN
  Administered 2018-06-15: 25 mg via ORAL
  Administered 2018-06-15 – 2018-06-17 (×4): 50 mg via ORAL
  Administered 2018-06-18: 25 mg via ORAL
  Administered 2018-06-18 – 2018-06-20 (×3): 50 mg via ORAL
  Filled 2018-06-14: qty 1
  Filled 2018-06-14 (×2): qty 2
  Filled 2018-06-14: qty 1
  Filled 2018-06-14 (×5): qty 2

## 2018-06-14 MED ORDER — SODIUM CHLORIDE 0.9% FLUSH
10.0000 mL | INTRAVENOUS | Status: DC | PRN
  Administered 2018-06-17: 10 mL
  Filled 2018-06-14: qty 40

## 2018-06-14 MED ORDER — LORAZEPAM 2 MG/ML IJ SOLN
2.0000 mg | INTRAMUSCULAR | Status: DC | PRN
  Administered 2018-06-14 – 2018-06-18 (×16): 2 mg via INTRAVENOUS
  Filled 2018-06-14 (×17): qty 1

## 2018-06-14 MED ORDER — NICOTINE 21 MG/24HR TD PT24
21.0000 mg | MEDICATED_PATCH | Freq: Every day | TRANSDERMAL | Status: DC
  Administered 2018-06-14 – 2018-06-21 (×8): 21 mg via TRANSDERMAL
  Filled 2018-06-14 (×8): qty 1

## 2018-06-14 MED ORDER — FUROSEMIDE 40 MG PO TABS
40.0000 mg | ORAL_TABLET | Freq: Every day | ORAL | Status: DC
  Filled 2018-06-14: qty 1

## 2018-06-14 NOTE — Progress Notes (Signed)
Cardizem d/c'd at 1130. Pt. Given PO cardizem at 0900. Currently is SR with HR at 89.

## 2018-06-14 NOTE — Procedures (Signed)
Ultrasound-guided diagnostic and therapeutic paracentesis performed yielding 6.1 liters of yellow fluid. No immediate complications.  A portion of the fluid was submitted to the lab for preordered studies. EBL< 1cc.

## 2018-06-14 NOTE — Progress Notes (Signed)
PROGRESS NOTE    Anne Black  ZOX:[id number] DOB: 1960-10-30 DOA: 06/13/2018 PCP: Patient, No Pcp Per    Brief Narrative:  58 year old female who presented after a fall.  She does have significant past medical history for paroxysmal atrial fibrillation, alcohol dependence, alcoholic liver cirrhosis, COPD with chronic hypoxic respiratory failure, depression, bipolar and tobacco abuse.  Apparently about 24 hours prior to hospitalization, he fell from a chair, she was unable to stand back up on her feet, and was found by her daughter the following day and EMS was called.  Patient was somnolent unable to give detailed history.  Last drink was about 2 days prior to hospitalization.  On her initial physical examination blood pressure 116/71, pulse rate 141, respiratory rate 16, temperature 97.6, oxygen saturation 93%.  Her lungs were clear to auscultation bilaterally, heart S1-S2 present, tachycardic, her abdomen was distended, nontender, +2+ lower extremity edema, she was noted to be somnolent, slight tremor of her hands without frank asterixis.  Her EKG was 140 bpm, with normal axis, narrow QRS, 2:1 flutter, no significant ST segment or T wave changes.  Patient was admitted to the hospital with a working diagnosis of atrial flutter with rapid ventricular response, complicated by alcohol withdrawal syndrome.    Assessment & Plan:   Principal Problem:   Atrial fibrillation with RVR (HCC) Active Problems:   Tobacco abuse   Alcohol use disorder, severe, dependence (HCC)   Alcoholic cirrhosis of liver with ascites (HCC)   Thrombocytopenia (HCC)   COPD (chronic obstructive pulmonary disease) (HCC)   1. Atrial flutter with rapid ventricular response. Patient has converted to sinus rhythm, clinical with no signs of pulmonary edema. She continues on IV diltiazem, continue oral diltiazem and wean down drip to heart rate less than 130 bpm. Not anticoagulation due to fall risk and alcohol abuse. Continue  telemetry monitoring, no signs of pulmonary edema. Blood pressure 105 systolic. Echocardiogram from 01/20 with preserved LV systolic function.   2. Alcohol withdrawal syndrome. Continue supportive medical therapy with as needed lorazepam per CIWA protocol. Continue neuro checks. Will continue IV fluids with dextrose and lactate ringers. Will hold on diuretic therapy for now.   3. Alcoholic cirrhosis. Positive ascites on physical examination, patient had paracentesis in the past, will order US guided paracentesis and send fluid for analysis. Lower extremity edema, possible due to cirrhosis.   4. Hypokalemia. K this am is down to 3,2, will continue K correction, check Mg, target K at 4 and Mg at 2. Follow renal panel in am, continue hydration with LR and dextrose, hold on diuretic therapy for now.   4. COPD. No clinical signs of exacerbation, will continue oxymetry monitoring.   5. Obesity. Calculated BMI is 37.9 will need outpatient follow up.   DVT prophylaxis: scd   Code Status:  full Family Communication: no family at the bedsid e Disposition Plan/ discharge barriers: Pending clinical improvement.   Body mass index is 37.94 kg/m. Malnutrition Type:      Malnutrition Characteristics:      Nutrition Interventions:     RN Pressure Injury Documentation:     Consultants:     Procedures:     Antimicrobials:       Subjective: Patient is somnolent but easy to arouse, no nausea or vomiting, positive abdominal distention, no headache or dyspnea. No chest pain or palpitations. At home uses a walker for ambulation, and also has a wheelchair.   Objective: Vitals:   06/14/18 0400 06/14/18 0500 06/14/18  0600 06/14/18 0700  BP: (!) 96/50 (!) 106/52 (!) 102/42 (!) 105/45  Pulse: 81 81 93 77  Resp: 15 15 14 13   Temp: 98.3 F (36.8 C)     TempSrc: Oral     SpO2: 98% 98% 94% 98%  Weight:      Height:        Intake/Output Summary (Last 24 hours) at 06/14/2018 0811 Last  data filed at 06/14/2018 0500 Gross per 24 hour  Intake 1685.83 ml  Output -  Net 1685.83 ml   Filed Weights   06/13/18 1931 06/14/18 0127  Weight: 86 kg 94.1 kg    Examination:   General: deconditioned and ill looking appearing  Neurology: somnolent, but easy to arouse, moves all 4 extremities.  E ENT: mild pallor, no icterus, oral mucosa moist Cardiovascular: No JVD. S1-S2 present, rhythmic, no gallops, rubs, or murmurs. Positive ++ pitting lower extremity edema. Pulmonary: positive breath sounds bilaterally, adequate air movement, no wheezing, rhonchi or rales. Gastrointestinal. Abdomen distended, no organomegaly, non tender, no rebound or guarding. Positive dullness to percussions laterally.  Skin. No rashes Musculoskeletal: no joint deformities     Data Reviewed: I have personally reviewed following labs and imaging studies  CBC: Recent Labs  Lab 06/11/18 2138 06/13/18 2002 06/14/18 0332  WBC 4.6 3.7* 3.4*  NEUTROABS 3.1 2.6  --   HGB 8.4* 9.2* 8.2*  HCT 29.9* 32.1* 29.2*  MCV 81.3 81.1 81.6  PLT PLATELET CLUMPS NOTED ON SMEAR, UNABLE TO ESTIMATE 118* 103*   Basic Metabolic Panel: Recent Labs  Lab 06/11/18 2138 06/13/18 2002 06/14/18 0332  NA 132* 133* 134*  K 4.0 3.4* 3.2*  CL 95* 94* 98  CO2 29 29 27   GLUCOSE 100* 101* 105*  BUN 6 7 7   CREATININE 0.49 0.70 0.50  CALCIUM 7.8* 8.0* 7.3*   GFR: Estimated Creatinine Clearance: 81.9 mL/min (by C-G formula based on SCr of 0.5 mg/dL). Liver Function Tests: Recent Labs  Lab 06/11/18 2138 06/13/18 2002  AST 44* 62*  ALT 24 27  ALKPHOS 148* 149*  BILITOT 3.1* 3.2*  PROT 6.7 6.5  ALBUMIN 2.7* 2.7*   Recent Labs  Lab 06/11/18 2138 06/13/18 2002  LIPASE 28 28   Recent Labs  Lab 06/13/18 2003  AMMONIA 28   Coagulation Profile: Recent Labs  Lab 06/13/18 2002  INR 1.5*   Cardiac Enzymes: Recent Labs  Lab 06/13/18 2002 06/14/18 0332  CKTOTAL 595* 623*   BNP (last 3 results) No results  for input(s): PROBNP in the last 8760 hours. HbA1C: No results for input(s): HGBA1C in the last 72 hours. CBG: No results for input(s): GLUCAP in the last 168 hours. Lipid Profile: No results for input(s): CHOL, HDL, LDLCALC, TRIG, CHOLHDL, LDLDIRECT in the last 72 hours. Thyroid Function Tests: No results for input(s): TSH, T4TOTAL, FREET4, T3FREE, THYROIDAB in the last 72 hours. Anemia Panel: No results for input(s): VITAMINB12, FOLATE, FERRITIN, TIBC, IRON, RETICCTPCT in the last 72 hours.    Radiology Studies: I have reviewed all of the imaging during this hospital visit personally     Scheduled Meds: . Chlorhexidine Gluconate Cloth  6 each Topical Daily  . diltiazem  120 mg Oral Daily  . furosemide  40 mg Oral Daily  . mouth rinse  15 mL Mouth Rinse BID  . nicotine  21 mg Transdermal Daily  . sodium chloride flush  3 mL Intravenous Q12H  . spironolactone  25 mg Oral Daily  . thiamine  100  mg Oral Daily   Or  . thiamine  100 mg Intravenous Daily   Continuous Infusions: . sodium chloride 100 mL/hr at 06/14/18 0500  . diltiazem (CARDIZEM) infusion 5 mg/hr (06/14/18 0703)     LOS: 1 day        Milissa Fesperman Annett Gula, MD

## 2018-06-14 NOTE — Evaluation (Signed)
Physical Therapy Evaluation Patient Details Name: Anne Black MRN: [id number] DOB: 1960/11/09 Today's Date: 06/14/2018   History of Present Illness  58 yo female admitted with Afib/SVT, fall. Hx of MS, A fib, bipolar d/o, ETOH cirrhosis, ADHD, ascites, arthritis, COPD  Clinical Impression  On eval, pt required Mod assist +2 for mobility. Level of assistance required on today possibly due to lethargy and pt's lack of motivation to mobilize. She was able to stand and pivot to/from bsc with 2 HHA. She c/o back and bil LEs pain with activity. Will follow and progress as tolerated.     Follow Up Recommendations SNF vs Home health PT;Supervision/Assistance - 24 hour(depending on progress)    Equipment Recommendations  None recommended by PT    Recommendations for Other Services       Precautions / Restrictions Precautions Precautions: Fall Restrictions Weight Bearing Restrictions: No      Mobility  Bed Mobility Overal bed mobility: Needs Assistance Bed Mobility: Supine to Sit;Sit to Supine     Supine to sit: Mod assist;+2 for physical assistance;+2 for safety/equipment;HOB elevated Sit to supine: Mod assist;+2 for physical assistance;+2 for safety/equipment;HOB elevated   General bed mobility comments: Assist for trunk and bil LEs. Multimodal cueing required. Increased time.   Transfers Overall transfer level: Needs assistance Equipment used: 2 person hand held assist Transfers: Sit to/from [employer]GI CorporationStand;Stand Pivot Transfers Sit to Stand: Mod assist;+2 physical assistance;+2 safety/equipment Stand pivot transfers: Mod assist;+2 physical assistance;+2 safety/equipment       General transfer comment: Assist to rise, stabilize, control descent. Stand pivot x 2, bed<>bsc.   Ambulation/Gait             General Gait Details: NT  Stairs            Wheelchair Mobility    Modified Rankin (Stroke Patients Only)       Balance Overall balance assessment: Needs  assistance         Standing balance support: Bilateral upper extremity supported Standing balance-Leahy Scale: Poor                               Pertinent Vitals/Pain Pain Assessment: Faces Faces Pain Scale: Hurts even more Pain Location: back, LEs Pain Descriptors / Indicators: Tender;Sore;Grimacing;Guarding Pain Intervention(s): Limited activity within patient's tolerance;Repositioned    Home Living Family/patient expects to be discharged to:: Private residence Living Arrangements: Alone Available Help at Discharge: Family;Available PRN/intermittently Type of Home: House Home Access: Level entry     Home Layout: Two level;Able to live on main level with bedroom/bathroom Home Equipment: Dan HumphreysWalker - 2 wheels;Cane - single point;Wheelchair - manual;Shower seat Additional Comments: above per previous admission history taking    Prior Function Level of Independence: Independent with assistive device(s)         Comments: Uses walker. History of self neglect. (Per previous admission history taking)     Hand Dominance        Extremity/Trunk Assessment   Upper Extremity Assessment Upper Extremity Assessment: Generalized weakness    Lower Extremity Assessment Lower Extremity Assessment: Generalized weakness(edema bil LEs/feet)    Cervical / Trunk Assessment Cervical / Trunk Assessment: Normal  Communication   Communication: No difficulties  Cognition Arousal/Alertness: Lethargic;Suspect due to medications Behavior During Therapy: [facility] for tasks assessed/performed Overall Cognitive Status: Difficult to assess  General Comments      Exercises     Assessment/Plan    PT Assessment Patient needs continued PT services  PT Problem List Decreased strength;Decreased balance;Decreased mobility;Decreased activity tolerance;Decreased knowledge of use of DME;Pain       PT Treatment Interventions DME  instruction;Gait training;Therapeutic activities;Functional mobility training;Balance training;Patient/family education;Therapeutic exercise    PT Goals (Current goals can be found in the Care Plan section)  Acute Rehab PT Goals Patient Stated Goal: none stated PT Goal Formulation: With patient Time For Goal Achievement: 06/28/18 Potential to Achieve Goals: Good    Frequency Min 3X/week   Barriers to discharge        Co-evaluation               AM-PAC PT "6 Clicks" Mobility  Outcome Measure Help needed turning from your back to your side while in a flat bed without using bedrails?: A Lot Help needed moving from lying on your back to sitting on the side of a flat bed without using bedrails?: A Lot Help needed moving to and from a bed to a chair (including a wheelchair)?: A Lot Help needed standing up from a chair using your arms (e.g., wheelchair or bedside chair)?: A Lot Help needed to walk in hospital room?: A Lot Help needed climbing 3-5 steps with a railing? : A Lot 6 Click Score: 12    End of Session   Activity Tolerance: Patient limited by fatigue;Patient limited by pain Patient left: in bed;with call bell/phone within reach;with bed alarm set Nurse Communication: (need for new pure-wick) PT Visit Diagnosis: Muscle weakness (generalized) (M62.81);Difficulty in walking, not elsewhere classified (R26.2);Pain Pain - part of body: (back/legs)    Time: 6644-0347 PT Time Calculation (min) (ACUTE ONLY): 23 min   Charges:   PT Evaluation $PT Eval Moderate Complexity: 1 Mod PT Treatments $Therapeutic Activity: 8-22 mins          Rebeca Alert, PT Acute Rehabilitation Services Pager: [phone] Office: [phone]

## 2018-06-14 NOTE — Evaluation (Signed)
Occupational Therapy Evaluation Patient Details Name: Anne Black MRN: [id number] DOB: [birth year] Today's Date: 06/14/2018    History of Present Illness 58 yo female admitted with Afib/SVT, fall. Hx of MS, A fib, bipolar Black/o, ETOH cirrhosis, ADHD, ascites, arthritis, COPD   Clinical Impression   Pt admitted with the above. Pt currently with functional limitations due to the deficits listed below (see OT Problem List).  Pt will benefit from skilled OT to increase their safety and independence with ADL and functional mobility for ADL to facilitate discharge to venue listed below.   Depending on progress pt may need ST SNF or HH with 24/7  OT eval limited due to fatigue.    Initiated BUE Exercise as pt declined OOB at this time     Follow Up Recommendations  Home health OT;SNF;Supervision/Assistance - 24 hour    Equipment Recommendations  None recommended by OT       Precautions / Restrictions Precautions Precautions: Fall Restrictions Weight Bearing Restrictions: No      Mobility Bed Mobility Overal bed mobility: Needs Assistance Bed Mobility: Rolling Rolling: Mod assist   Supine to sit: Mod assist;+2 for physical assistance;+2 for safety/equipment;HOB elevated Sit to supine: Mod assist;+2 for physical assistance;+2 for safety/equipment;HOB elevated   General bed mobility comments: Assist for trunk and bil LEs. Multimodal cueing required. Increased time.   Transfers Overall transfer level: Needs assistance Equipment used: 2 person hand held assist Transfers: Sit to/from [employer] Sit to Stand: Mod assist;+2 physical assistance;+2 safety/equipment Stand pivot transfers: Mod assist;+2 physical assistance;+2 safety/equipment       General transfer comment: Assist to rise, stabilize, control descent. Stand pivot x 2, bed<>bsc.     Balance Overall balance assessment: Needs assistance         Standing balance support: Bilateral upper extremity  supported Standing balance-Leahy Scale: Poor                             ADL either performed or assessed with clinical judgement   ADL Overall ADL's : Needs assistance/impaired Eating/Feeding: Minimal assistance;Bed level   Grooming: Minimal assistance;Bed level                                       Vision Patient Visual Report: No change from baseline              Pertinent Vitals/Pain Pain Assessment: Faces Faces Pain Scale: Hurts little more Pain Location: abdomen Pain Descriptors / Indicators: Discomfort Pain Intervention(s): Limited activity within patient's tolerance;Repositioned     Hand Dominance     Extremity/Trunk Assessment Upper Extremity Assessment Upper Extremity Assessment: Generalized weakness   Lower Extremity Assessment Lower Extremity Assessment: Generalized weakness(edema bil LEs/feet)   Cervical / Trunk Assessment Cervical / Trunk Assessment: Normal   Communication Communication Communication: No difficulties   Cognition Arousal/Alertness: Awake/alert but fatigued quickly Behavior During Therapy: WFL for tasks assessed/performed Overall Cognitive Status: Within Functional Limits for tasks assessed                                                Home Living Family/patient expects to be discharged to:: Private residence Living Arrangements: Alone Available Help at Discharge: Family;Available PRN/intermittently Type of Home:  House Home Access: Level entry     Home Layout: Two level;Able to live on main level with bedroom/bathroom     Bathroom Shower/Tub: Walk-in shower         Home Equipment: Environmental consultantWalker - 2 wheels;Cane - single point;Wheelchair - manual;Shower seat   Additional Comments: above per previous admission history taking      Prior Functioning/Environment Level of Independence: Independent with assistive device(s)        Comments: Uses walker. History of self neglect. (Per  previous admission history taking)        OT Problem List: Decreased strength;Impaired balance (sitting and/or standing);Decreased activity tolerance;Decreased knowledge of use of DME or AE;Decreased safety awareness      OT Treatment/Interventions: Self-care/ADL training;Patient/family education;DME and/or AE instruction;Therapeutic activities    OT Goals(Current goals can be found in the care plan section) Acute Rehab OT Goals Patient Stated Goal: none stated OT Goal Formulation: With patient Time For Goal Achievement: 06/21/18  OT Frequency: Min 2X/week    AM-PAC OT "6 Clicks" Daily Activity     Outcome Measure Help from another person eating meals?: A Little Help from another person taking care of personal grooming?: A Little Help from another person toileting, which includes using toliet, bedpan, or urinal?: A Lot Help from another person bathing (including washing, rinsing, drying)?: A Lot Help from another person to put on and taking off regular upper body clothing?: A Lot Help from another person to put on and taking off regular lower body clothing?: Total 6 Click Score: 13   End of Session Nurse Communication: Mobility status  Activity Tolerance: Patient limited by fatigue Patient left: in bed;with call bell/phone within reach;with nursing/sitter in room  OT Visit Diagnosis: Unsteadiness on feet (R26.81);Other abnormalities of gait and mobility (R26.89);Muscle weakness (generalized) (M62.81)                Time: [id number] OT Time Calculation (min): 14 min Charges:  OT General Charges $OT Visit: 1 Visit OT Evaluation $OT Eval Moderate Complexity: 1 Mod  Anne Black, OT Acute Rehabilitation Services [phone]- [phone] Office- [phone]     Anne Black, Anne Black 06/14/2018, 3:45 PM

## 2018-06-15 LAB — CBC WITH DIFFERENTIAL/PLATELET
Abs Immature Granulocytes: 0.01 10*3/uL (ref 0.00–0.07)
Basophils Absolute: 0.1 10*3/uL (ref 0.0–0.1)
Basophils Relative: 1 %
Eosinophils Absolute: 0.1 10*3/uL (ref 0.0–0.5)
Eosinophils Relative: 4 %
HCT: 30.3 % — ABNORMAL LOW (ref 36.0–46.0)
Hemoglobin: 8.7 g/dL — ABNORMAL LOW (ref 12.0–15.0)
Immature Granulocytes: 0 %
Lymphocytes Relative: 19 %
Lymphs Abs: 0.7 10*3/uL (ref 0.7–4.0)
MCH: 23.1 pg — ABNORMAL LOW (ref 26.0–34.0)
MCHC: 28.7 g/dL — ABNORMAL LOW (ref 30.0–36.0)
MCV: 80.4 fL (ref 80.0–100.0)
Monocytes Absolute: 0.5 10*3/uL (ref 0.1–1.0)
Monocytes Relative: 12 %
Neutro Abs: 2.4 10*3/uL (ref 1.7–7.7)
Neutrophils Relative %: 64 %
Platelets: 97 10*3/uL — ABNORMAL LOW (ref 150–400)
RBC: 3.77 MIL/uL — ABNORMAL LOW (ref 3.87–5.11)
RDW: 22.7 % — ABNORMAL HIGH (ref 11.5–15.5)
WBC: 3.8 10*3/uL — ABNORMAL LOW (ref 4.0–10.5)
nRBC: 0 % (ref 0.0–0.2)

## 2018-06-15 LAB — MRSA PCR SCREENING: MRSA by PCR: NEGATIVE

## 2018-06-15 LAB — BASIC METABOLIC PANEL
Anion gap: 7 (ref 5–15)
BUN: 6 mg/dL (ref 6–20)
CO2: 27 mmol/L (ref 22–32)
Calcium: 7.7 mg/dL — ABNORMAL LOW (ref 8.9–10.3)
Chloride: 96 mmol/L — ABNORMAL LOW (ref 98–111)
Creatinine, Ser: 0.51 mg/dL (ref 0.44–1.00)
GFR calc Af Amer: >60 mL/min (ref >60)
GFR calc non Af Amer: >60 mL/min (ref >60)
Glucose, Bld: 99 mg/dL (ref 70–99)
Potassium: 4.2 mmol/L (ref 3.5–5.1)
Sodium: 130 mmol/L — ABNORMAL LOW (ref 135–145)

## 2018-06-15 LAB — GRAM STAIN

## 2018-06-15 LAB — MAGNESIUM: Magnesium: 1.6 mg/dL — ABNORMAL LOW (ref 1.7–2.4)

## 2018-06-15 LAB — PATHOLOGIST SMEAR REVIEW

## 2018-06-15 MED ORDER — MAGNESIUM SULFATE 2 GM/50ML IV SOLN
2.0000 g | Freq: Once | INTRAVENOUS | Status: AC
  Administered 2018-06-15: 11:00:00 2 g via INTRAVENOUS
  Filled 2018-06-15: qty 50

## 2018-06-15 MED ORDER — SPIRONOLACTONE 25 MG PO TABS
25.0000 mg | ORAL_TABLET | Freq: Every day | ORAL | Status: DC
  Administered 2018-06-15 – 2018-06-16 (×2): 25 mg via ORAL
  Filled 2018-06-15 (×2): qty 1

## 2018-06-15 MED ORDER — CHLORDIAZEPOXIDE HCL 5 MG PO CAPS
5.0000 mg | ORAL_CAPSULE | Freq: Three times a day (TID) | ORAL | Status: DC
  Administered 2018-06-15 – 2018-06-16 (×4): 5 mg via ORAL
  Filled 2018-06-15 (×4): qty 1

## 2018-06-15 NOTE — Progress Notes (Addendum)
PROGRESS NOTE    Anne Black  JYN:[id number] DOB: 12-19-1960 DOA: 06/13/2018 PCP: Patient, No Pcp Per    Brief Narrative:  58 year old female who presented after a fall.  She does have significant past medical history for paroxysmal atrial fibrillation, alcohol dependence, alcoholic liver cirrhosis, COPD with chronic hypoxic respiratory failure, depression, bipolar and tobacco abuse.  Apparently about 24 hours prior to hospitalization, he fell from a chair, she was unable to stand back up on her feet, and was found by her daughter the following day and EMS was called.  Patient was somnolent unable to give detailed history.  Last drink was about 2 days prior to hospitalization.  On her initial physical examination blood pressure 116/71, pulse rate 141, respiratory rate 16, temperature 97.6, oxygen saturation 93%.  Her lungs were clear to auscultation bilaterally, heart S1-S2 present, tachycardic, her abdomen was distended, nontender, +2+ lower extremity edema, she was noted to be somnolent, slight tremor of her hands without frank asterixis.  Her EKG was 140 bpm, with normal axis, narrow QRS, 2:1 flutter, no significant ST segment or T wave changes.  Patient was admitted to the hospital with a working diagnosis of atrial flutter with rapid ventricular response, complicated by alcohol withdrawal syndrome.    Assessment & Plan:   Principal Problem:   Atrial fibrillation with RVR (HCC) Active Problems:   Tobacco abuse   Alcohol use disorder, severe, dependence (HCC)   Alcoholic cirrhosis of liver with ascites (HCC)   Thrombocytopenia (HCC)   COPD (chronic obstructive pulmonary disease) (HCC)   1. Atrial flutter with rapid ventricular response. Patient has remained on sinus rhythm, personally reviewed telemetry monitor. Off diltiazem drip and tolerating well po diltiazem 240 mg ER. Not on anticoagulation due to fall risk and alcohol abuse.   2. Alcohol withdrawal syndrome. Patient very  agitated last night, required lorazepam, morphine and benadryl. Since admission has required 12 mg of lorazepam. This am somnolent and confused. Will continue close neuro checks, will add librium tid 5 mg. Will keep patient in the step down unit.   3. Alcoholic cirrhosis. SP large volume paracentesis, 6,1 L of yellow fluid drained, 132 WBC with no organism seen on gram stain. Will continue supportive medical care and will resume low dose diuretic therapy with spironolactone. Hold on IV fluids for now.   4. Hypokalemia. Potassium has been corrected to 4,2 with Po Kcl, will continue Mg correction with IV mag sulfate 2 grams, for a Mg today down to 1,6. Follow renal panel in am, will resume diuretic therapy with aldactone.    4. COPD. Currnetly with no clinical signs of exacerbation.   5. Obesity. BMI is 37.9 Out of bed as tolerated, will consult physical therapy.  6. Back pain. Now on flexeril and IV morphine for pain control.   DVT prophylaxis: scd   Code Status:  full Family Communication: no family at the bedsid e Disposition Plan/ discharge barriers: Pending clinical improvement.   Body mass index is 37.86 kg/m. Malnutrition Type:      Malnutrition Characteristics:      Nutrition Interventions:     RN Pressure Injury Documentation:     Consultants:     Procedures:   Large volume paracentesis 05/06 (6.1L)  Antimicrobials:       Subjective: Patient is sedated and confused, positive back pain, no nausea or vomiting. Last night very agitated and confuses. Required lorazepam per protocol.   Objective: Vitals:   06/15/18 0403 06/15/18 0417 06/15/18 0513 06/15/18  0602  BP: (!) 107/40  (!) 102/50 (!) 106/41  Pulse: 78  84 69  Resp: 15  19 (!) 9  Temp:      TempSrc:      SpO2: 99%  93% 100%  Weight:  93.9 kg    Height:        Intake/Output Summary (Last 24 hours) at 06/15/2018 0754 Last data filed at 06/15/2018 0600 Gross per 24 hour  Intake 2809.56 ml   Output 830 ml  Net 1979.56 ml   Filed Weights   06/13/18 1931 06/14/18 0127 06/15/18 0417  Weight: 86 kg 94.1 kg 93.9 kg    Examination:   General: deconditioned and ill looking appearing Neurology: Somnolent, not focal. Positive disorientation   E ENT: positive pallor, no icterus, oral mucosa moist Cardiovascular: No JVD. S1-S2 present, rhythmic, no gallops, rubs, or murmurs. ++ pitting bilateral lower extremity edema. Pulmonary: positive breath sounds bilaterally, adequate air movement, no wheezing, rhonchi or rales. Gastrointestinal. Abdomen distended and no organomegaly, non tender, no rebound or guarding/ decreased dullness to percussion.  Skin. No rashes Musculoskeletal: no joint deformities     Data Reviewed: I have personally reviewed following labs and imaging studies  CBC: Recent Labs  Lab 06/11/18 2138 06/13/18 2002 06/14/18 0332 06/15/18 0333  WBC 4.6 3.7* 3.4* 3.8*  NEUTROABS 3.1 2.6  --  2.4  HGB 8.4* 9.2* 8.2* 8.7*  HCT 29.9* 32.1* 29.2* 30.3*  MCV 81.3 81.1 81.6 80.4  PLT PLATELET CLUMPS NOTED ON SMEAR, UNABLE TO ESTIMATE 118* 103* 97*   Basic Metabolic Panel: Recent Labs  Lab 06/11/18 2138 06/13/18 2002 06/14/18 0332 06/15/18 0333  NA 132* 133* 134* 130*  K 4.0 3.4* 3.2* 4.2  CL 95* 94* 98 96*  CO2 29 29 27 27   GLUCOSE 100* 101* 105* 99  BUN 6 7 7 6   CREATININE 0.49 0.70 0.50 0.51  CALCIUM 7.8* 8.0* 7.3* 7.7*  MG  --   --   --  1.6*   GFR: Estimated Creatinine Clearance: 81.8 mL/min (by C-G formula based on SCr of 0.51 mg/dL). Liver Function Tests: Recent Labs  Lab 06/11/18 2138 06/13/18 2002  AST 44* 62*  ALT 24 27  ALKPHOS 148* 149*  BILITOT 3.1* 3.2*  PROT 6.7 6.5  ALBUMIN 2.7* 2.7*   Recent Labs  Lab 06/11/18 2138 06/13/18 2002  LIPASE 28 28   Recent Labs  Lab 06/13/18 2003  AMMONIA 28   Coagulation Profile: Recent Labs  Lab 06/13/18 2002  INR 1.5*   Cardiac Enzymes: Recent Labs  Lab 06/13/18 2002 06/14/18  0332  CKTOTAL 595* 623*   BNP (last 3 results) No results for input(s): PROBNP in the last 8760 hours. HbA1C: No results for input(s): HGBA1C in the last 72 hours. CBG: No results for input(s): GLUCAP in the last 168 hours. Lipid Profile: No results for input(s): CHOL, HDL, LDLCALC, TRIG, CHOLHDL, LDLDIRECT in the last 72 hours. Thyroid Function Tests: No results for input(s): TSH, T4TOTAL, FREET4, T3FREE, THYROIDAB in the last 72 hours. Anemia Panel: No results for input(s): VITAMINB12, FOLATE, FERRITIN, TIBC, IRON, RETICCTPCT in the last 72 hours.    Radiology Studies: I have reviewed all of the imaging during this hospital visit personally     Scheduled Meds: . Chlorhexidine Gluconate Cloth  6 each Topical Daily  . diltiazem  120 mg Oral Daily  . mouth rinse  15 mL Mouth Rinse BID  . nicotine  21 mg Transdermal Daily  . pantoprazole  40 mg Oral Daily  . sodium chloride flush  10-40 mL Intracatheter Q12H  . sodium chloride flush  3 mL Intravenous Q12H  . thiamine  100 mg Oral Daily   Or  . thiamine  100 mg Intravenous Daily   Continuous Infusions: . dextrose 5% lactated ringers 50 mL/hr at 06/15/18 0750  . diltiazem (CARDIZEM) infusion Stopped (06/14/18 1125)     LOS: 2 days        Jeriko Kowalke Annett Gulaaniel Jamylah Marinaccio, MD

## 2018-06-16 DIAGNOSIS — G9341 Metabolic encephalopathy: Secondary | ICD-10-CM

## 2018-06-16 LAB — BASIC METABOLIC PANEL
Anion gap: 7 (ref 5–15)
BUN: 5 mg/dL — ABNORMAL LOW (ref 6–20)
CO2: 26 mmol/L (ref 22–32)
Calcium: 7.7 mg/dL — ABNORMAL LOW (ref 8.9–10.3)
Chloride: 99 mmol/L (ref 98–111)
Creatinine, Ser: 0.45 mg/dL (ref 0.44–1.00)
GFR calc Af Amer: >60 mL/min (ref >60)
GFR calc non Af Amer: >60 mL/min (ref >60)
Glucose, Bld: 114 mg/dL — ABNORMAL HIGH (ref 70–99)
Potassium: 4 mmol/L (ref 3.5–5.1)
Sodium: 132 mmol/L — ABNORMAL LOW (ref 135–145)

## 2018-06-16 LAB — MAGNESIUM: Magnesium: 1.6 mg/dL — ABNORMAL LOW (ref 1.7–2.4)

## 2018-06-16 MED ORDER — SPIRONOLACTONE 25 MG PO TABS
50.0000 mg | ORAL_TABLET | Freq: Every day | ORAL | Status: DC
  Administered 2018-06-17 – 2018-06-21 (×5): 50 mg via ORAL
  Filled 2018-06-16 (×5): qty 2

## 2018-06-16 MED ORDER — HALOPERIDOL 1 MG PO TABS
1.0000 mg | ORAL_TABLET | ORAL | Status: DC | PRN
  Administered 2018-06-17 (×2): 1 mg via ORAL
  Filled 2018-06-16 (×4): qty 1

## 2018-06-16 MED ORDER — MORPHINE SULFATE (PF) 2 MG/ML IV SOLN
2.0000 mg | INTRAVENOUS | Status: DC | PRN
  Administered 2018-06-16 – 2018-06-18 (×9): 2 mg via INTRAVENOUS
  Filled 2018-06-16 (×9): qty 1

## 2018-06-16 MED ORDER — MAGNESIUM SULFATE 2 GM/50ML IV SOLN
2.0000 g | Freq: Once | INTRAVENOUS | Status: AC
  Administered 2018-06-16: 09:00:00 2 g via INTRAVENOUS
  Filled 2018-06-16: qty 50

## 2018-06-16 MED ORDER — ORAL CARE MOUTH RINSE
15.0000 mL | Freq: Two times a day (BID) | OROMUCOSAL | Status: DC
  Administered 2018-06-16 – 2018-06-21 (×7): 15 mL via OROMUCOSAL

## 2018-06-16 MED ORDER — CHLORDIAZEPOXIDE HCL 5 MG PO CAPS
10.0000 mg | ORAL_CAPSULE | Freq: Three times a day (TID) | ORAL | Status: DC
  Administered 2018-06-16 – 2018-06-21 (×15): 10 mg via ORAL
  Filled 2018-06-16: qty 1
  Filled 2018-06-16: qty 2
  Filled 2018-06-16: qty 1
  Filled 2018-06-16 (×11): qty 2
  Filled 2018-06-16: qty 1

## 2018-06-16 MED ORDER — SODIUM CHLORIDE 0.9 % IV SOLN
INTRAVENOUS | Status: DC | PRN
  Administered 2018-06-16: 09:00:00 250 mL via INTRAVENOUS

## 2018-06-16 MED ORDER — HALOPERIDOL LACTATE 5 MG/ML IJ SOLN: 1.0000 mg | INTRAMUSCULAR | Status: DC | PRN

## 2018-06-16 NOTE — Progress Notes (Signed)
OT Cancellation Note  Patient Details Name: AMARION MCGLOTHEN MRN: [id number] DOB: 04-26-1960   Cancelled Treatment:    Reason Eval/Treat Not Completed: Other (comment).  Per RN, pt is now asleep and worked with PT earlier. Will try to check back tomorrow.  Varnika Butz 06/16/2018, 3:15 PM  Marica Otter, OTR/L Acute Rehabilitation Services [phone] [facility] pager [phone] office 06/16/2018

## 2018-06-16 NOTE — Progress Notes (Signed)
PROGRESS NOTE    JENIFFER KOSTERS  YQI:[id number] DOB: 02/14/1960 DOA: 06/13/2018 PCP: Patient, No Pcp Per    Brief Narrative:  58 year old female who presented after a fall. She does have significant past medical history for paroxysmal atrial fibrillation, alcohol dependence, alcoholic liver cirrhosis, COPD with chronic hypoxic respiratory failure, depression, bipolar and tobacco abuse. Apparently about 24 hours prior to hospitalization, he fell from a chair, she was unable to stand back up on her feet, andwas found by her daughter the following day and EMS was called. Patient was somnolent unable to give detailed history. Last drink was about 2 days prior to hospitalization.On her initial physical examination blood pressure 116/71, pulse rate 141, respiratory rate 16, temperature 97.6, oxygen saturation 93%. Her lungs were clear to auscultation bilaterally, heart S1-S2 present, tachycardic, her abdomen was distended, nontender, +2+ lower extremity edema, she was noted to be somnolent, slight tremor of her hands without frank asterixis. Her EKG was 140 bpm, with normal axis, narrow QRS, 2:1 flutter, no significant ST segment or T wave changes.  Patient was admitted to the hospital with a working diagnosis of atrial flutter with rapid ventricular response, complicated by alcohol withdrawal syndrome.   Assessment & Plan:   Principal Problem:   Atrial fibrillation with RVR (HCC) Active Problems:   Tobacco abuse   Alcohol use disorder, severe, dependence (HCC)   Alcoholic cirrhosis of liver with ascites (HCC)   Thrombocytopenia (HCC)   COPD (chronic obstructive pulmonary disease) (HCC)   1. Atrial flutter with rapid ventricular response. Continue with po diltiazem 240 mg ER, rate has remained well controlled, continue on sinus rhythm.   2. Alcohol withdrawal syndrome. Continue to have agitation specially at night, she has received 18 mg of lorazepam over last 24 H, per protocol, plus  chlordiazepoxide 5 mg tid. Will increase chlordiazepoxide to 10 mg tid, and will continue as needed lorazepam. Will add as needed haldol IM 1 mg every 4 H. Continue pain control with 2 mg IV morphine as needed   3. Alcoholic cirrhosis. SP large volume paracentesis, 6,1 L (05/06) Will discontinue IV fluids and will continue diuretic therapy with spironolactone up to 50 mg daily.   4. Hypokalemia/ hypomagensemia. Mg continue to be low at 1,6, target 2, will add 2 grams of Mag sulfate IV. K corrected to 4,0.  Will follow on renal panel in am. Avoid hypotension and nephrotoxic agents.   4. COPD. No clinical signs of exacerbation.   5. Obesity. BMI is 37.9   6. Back pain. Continue with flexeril and IV morphine for pain control.   DVT prophylaxis:scd Code Status:full Family Communication:no family at the bedside Disposition Plan/ discharge barriers:Pending clinical improvement, patient continue to have high benzodiazepine requirements, will keep patient in the progressive care unit.    Body mass index is 37.78 kg/m. Malnutrition Type:      Malnutrition Characteristics:      Nutrition Interventions:     RN Pressure Injury Documentation:     Consultants:     Procedures:     Antimicrobials:       Subjective: Patient very agitated last night, this am is somnolent, continue to complain of generalized pain, no nausea or vomiting, and has been tolerating po well. No chest pain or dyspnea.   Objective: Vitals:   06/16/18 0400 06/16/18 0500 06/16/18 0600 06/16/18 0800  BP: 120/64 (!) 110/54 (!) 99/45 (!) 116/54  Pulse: 87 84 80 80  Resp: 19 18 16 14   Temp:  TempSrc:      SpO2: (!) 89% 94% 91% 92%  Weight:  93.7 kg    Height:        Intake/Output Summary (Last 24 hours) at 06/16/2018 0829 Last data filed at 06/16/2018 1610 Gross per 24 hour  Intake 1183.68 ml  Output 650 ml  Net 533.68 ml   Filed Weights   06/14/18 0127 06/15/18 0417 06/16/18  0500  Weight: 94.1 kg 93.9 kg 93.7 kg    Examination:   General: Not in pain or dyspnea, deconditioned  Neurology: Awake and alert, non focal  E ENT: mild pallor, no icterus, oral mucosa moist Cardiovascular: No JVD. S1-S2 present, rhythmic, no gallops, rubs, or murmurs. No lower extremity edema. Pulmonary: positive breath sounds bilaterally, adequate air movement, no wheezing, rhonchi or rales. Gastrointestinal. Abdomen distended, non tender, dull to percussion at the dependent zones, with no organomegaly, no rebound or guarding Skin. No rashes Musculoskeletal: no joint deformities     Data Reviewed: I have personally reviewed following labs and imaging studies  CBC: Recent Labs  Lab 06/11/18 2138 06/13/18 2002 06/14/18 0332 06/15/18 0333  WBC 4.6 3.7* 3.4* 3.8*  NEUTROABS 3.1 2.6  --  2.4  HGB 8.4* 9.2* 8.2* 8.7*  HCT 29.9* 32.1* 29.2* 30.3*  MCV 81.3 81.1 81.6 80.4  PLT PLATELET CLUMPS NOTED ON SMEAR, UNABLE TO ESTIMATE 118* 103* 97*   Basic Metabolic Panel: Recent Labs  Lab 06/11/18 2138 06/13/18 2002 06/14/18 0332 06/15/18 0333 06/16/18 0332  NA 132* 133* 134* 130* 132*  K 4.0 3.4* 3.2* 4.2 4.0  CL 95* 94* 98 96* 99  CO2 GLUCOSE 100* 101* 105* 99 114*  BUN 5*  CREATININE 0.49 0.70 0.50 0.51 0.45  CALCIUM 7.8* 8.0* 7.3* 7.7* 7.7*  MG  --   --   --  1.6* 1.6*   GFR: Estimated Creatinine Clearance: 81.7 mL/min (by C-G formula based on SCr of 0.45 mg/dL). Liver Function Tests: Recent Labs  Lab 06/11/18 2138 06/13/18 2002  AST 44* 62*  ALT 24 27  ALKPHOS 148* 149*  BILITOT 3.1* 3.2*  PROT 6.7 6.5  ALBUMIN 2.7* 2.7*   Recent Labs  Lab 06/11/18 2138 06/13/18 2002  LIPASE 28 28   Recent Labs  Lab 06/13/18 2003  AMMONIA 28   Coagulation Profile: Recent Labs  Lab 06/13/18 2002  INR 1.5*   Cardiac Enzymes: Recent Labs  Lab 06/13/18 2002 06/14/18 0332  CKTOTAL 595* 623*   BNP (last 3 results) No results for  input(s): PROBNP in the last 8760 hours. HbA1C: No results for input(s): HGBA1C in the last 72 hours. CBG: No results for input(s): GLUCAP in the last 168 hours. Lipid Profile: No results for input(s): CHOL, HDL, LDLCALC, TRIG, CHOLHDL, LDLDIRECT in the last 72 hours. Thyroid Function Tests: No results for input(s): TSH, T4TOTAL, FREET4, T3FREE, THYROIDAB in the last 72 hours. Anemia Panel: No results for input(s): VITAMINB12, FOLATE, FERRITIN, TIBC, IRON, RETICCTPCT in the last 72 hours.    Radiology Studies: I have reviewed all of the imaging during this hospital visit personally     Scheduled Meds: . chlordiazePOXIDE  5 mg Oral TID  . Chlorhexidine Gluconate Cloth  6 each Topical Daily  . diltiazem  120 mg Oral Daily  . mouth rinse  15 mL Mouth Rinse BID  . nicotine  21 mg Transdermal Daily  . pantoprazole  40 mg Oral Daily  . sodium chloride flush  10-40 mL Intracatheter Q12H  . sodium chloride flush  3 mL Intravenous Q12H  . spironolactone  25 mg Oral Daily  . thiamine  100 mg Oral Daily   Or  . thiamine  100 mg Intravenous Daily   Continuous Infusions: . dextrose 5% lactated ringers 50 mL/hr at 06/15/18 1752     LOS: 3 days         Annett Gulaaniel , MD

## 2018-06-16 NOTE — Progress Notes (Signed)
Physical Therapy Treatment Patient Details Name: Anne Black MRN: [id number] DOB: 1961/01/05 Today's Date: 06/16/2018    History of Present Illness 58 yo female admitted with Afib/SVT, fall. Hx of MS, A fib, bipolar d/o, ETOH cirrhosis, ADHD, ascites, arthritis, COPD    PT Comments    Pt in bed on RA wanting to get OOB.  General bed mobility comments: Assist for trunk and bil LEs. Increased time. General transfer comment: assisted from elevated bed to [facility] VC's for safety General Gait Details: + 2 assist with RN pushing recliner behind for safety.  Short, choppy steps and unsteady gait.  AVG HR 94 and RA 96%.    Follow Up Recommendations  SNF;Home health PT;Supervision/Assistance - 24 hour(depending on progress as pt lives home alone )     Equipment Recommendations  None recommended by PT    Recommendations for Other Services       Precautions / Restrictions Precautions Precautions: Fall Restrictions Weight Bearing Restrictions: No    Mobility  Bed Mobility Overal bed mobility: Needs Assistance       Supine to sit: Mod assist;+2 for physical assistance;+2 for safety/equipment;HOB elevated     General bed mobility comments: Assist for trunk and bil LEs. Increased time.   Transfers Overall transfer level: Needs assistance Equipment used: 2 person hand held assist Transfers: Sit to/from [employer] Sit to Stand: Min assist;+2 safety/equipment Stand pivot transfers: Min assist;+2 safety/equipment       General transfer comment: assisted from elevated bed to [facility] VC's for safety   Ambulation/Gait Ambulation/Gait assistance: Min assist;+2 physical assistance;+2 safety/equipment Gait Distance (Feet): 15 Feet Assistive device: Rolling walker (2 wheeled) Gait Pattern/deviations: Step-to pattern;Decreased step length - left;Decreased step length - right Gait velocity: decreased    General Gait Details: + 2 assist with RN pushing recliner behind for  safety.  Short, choppy steps and unsteady gait.  AVG HR 94 and RA 96%.     Stairs             Wheelchair Mobility    Modified Rankin (Stroke Patients Only)       Balance                                            Cognition Arousal/Alertness: Awake/alert Behavior During Therapy: WFL for tasks assessed/performed Overall Cognitive Status: Within Functional Limits for tasks assessed                                 General Comments: pt "feels bad"       Exercises      General Comments        Pertinent Vitals/Pain Pain Assessment: Faces Faces Pain Scale: Hurts a little bit Pain Location: general Pain Descriptors / Indicators: Discomfort Pain Intervention(s): Monitored during session    Home Living                      Prior Function            PT Goals (current goals can now be found in the care plan section) Progress towards PT goals: Progressing toward goals    Frequency    Min 3X/week      PT Plan Current plan remains appropriate    Co-evaluation  AM-PAC PT "6 Clicks" Mobility   Outcome Measure  Help needed turning from your back to your side while in a flat bed without using bedrails?: A Lot Help needed moving from lying on your back to sitting on the side of a flat bed without using bedrails?: A Lot Help needed moving to and from a bed to a chair (including a wheelchair)?: A Lot Help needed standing up from a chair using your arms (e.g., wheelchair or bedside chair)?: A Lot Help needed to walk in hospital room?: A Lot Help needed climbing 3-5 steps with a railing? : Total 6 Click Score: 11    End of Session Equipment Utilized During Treatment: Gait belt Activity Tolerance: Patient limited by fatigue Patient left: in chair;with chair alarm set;with call bell/phone within reach Nurse Communication: Mobility status PT Visit Diagnosis: Muscle weakness (generalized)  (M62.81);Difficulty in walking, not elsewhere classified (R26.2);Pain     Time: 1152-1220 PT Time Calculation (min) (ACUTE ONLY): 28 min  Charges:  $Gait Training: 8-22 mins $Therapeutic Activity: 8-22 mins                     Felecia Shelling  PTA Acute  Rehabilitation Services Pager      [phone] Office      [phone]

## 2018-06-17 LAB — BASIC METABOLIC PANEL
Anion gap: 6 (ref 5–15)
BUN: <5 mg/dL — ABNORMAL LOW (ref 6–20)
CO2: 30 mmol/L (ref 22–32)
Calcium: 8 mg/dL — ABNORMAL LOW (ref 8.9–10.3)
Chloride: 97 mmol/L — ABNORMAL LOW (ref 98–111)
Creatinine, Ser: 0.43 mg/dL — ABNORMAL LOW (ref 0.44–1.00)
GFR calc Af Amer: >60 mL/min (ref >60)
GFR calc non Af Amer: >60 mL/min (ref >60)
Glucose, Bld: 104 mg/dL — ABNORMAL HIGH (ref 70–99)
Potassium: 4.1 mmol/L (ref 3.5–5.1)
Sodium: 133 mmol/L — ABNORMAL LOW (ref 135–145)

## 2018-06-17 LAB — MAGNESIUM: Magnesium: 2 mg/dL (ref 1.7–2.4)

## 2018-06-17 MED ORDER — ACETAMINOPHEN 325 MG PO TABS
650.0000 mg | ORAL_TABLET | Freq: Four times a day (QID) | ORAL | Status: DC
  Administered 2018-06-17 – 2018-06-21 (×16): 650 mg via ORAL
  Filled 2018-06-17 (×16): qty 2

## 2018-06-17 MED ORDER — IBUPROFEN 200 MG PO TABS
400.0000 mg | ORAL_TABLET | Freq: Three times a day (TID) | ORAL | Status: DC
  Administered 2018-06-17 – 2018-06-21 (×13): 400 mg via ORAL
  Filled 2018-06-17 (×13): qty 2

## 2018-06-17 NOTE — Progress Notes (Addendum)
Occupational Therapy Treatment Patient Details Name: ROMY WEEBER MRN: [id number] DOB: 1960/11/22 Today's Date: 06/17/2018    History of present illness 58 yo female admitted with Afib/SVT, fall. Hx of MS, A fib, bipolar d/o, ETOH cirrhosis, ADHD, ascites, arthritis, COPD   OT comments  Pt presents supine in bed, initially requesting/agreeable to get OOB to recliner but then later refusing, intermittently reporting pain in back/LEs. Pt requiring +2 assist for safe completion of bed mobility; performed sit<>stand from EOB and able to take few small side steps along EOB with overall minA (HHA, +2 safety) for better positioning once return to supine and to change out bed linens. Pt requiring cues and encouragement for participation and completion of mobility tasks today. Required totalA for LB ADL; VSS throughout. Will continue per POC at this time.    Follow Up Recommendations  Home health OT;SNF;Supervision/Assistance - 24 hour    Equipment Recommendations  None recommended by OT          Precautions / Restrictions Precautions Precautions: Fall Restrictions Weight Bearing Restrictions: No       Mobility Bed Mobility Overal bed mobility: Needs Assistance Bed Mobility: Supine to Sit;Sit to Supine     Supine to sit: Mod assist;Max assist;+2 for physical assistance;+2 for safety/equipment;HOB elevated Sit to supine: Max assist;+2 for physical assistance;+2 for safety/equipment   General bed mobility comments: Assist for trunk and bil LEs. Increased time. requires encouragement/cues to participate  Transfers Overall transfer level: Needs assistance Equipment used: 1 person hand held assist Transfers: Sit to/from Stand Sit to Stand: Min assist;+2 safety/equipment         General transfer comment: pt stood from EOB with boosting/steadying assist. steadying assist and cues for pt to side step along EOB prior to return to sitting     Balance Overall balance assessment: Needs  assistance   Sitting balance-Leahy Scale: Fair     Standing balance support: Bilateral upper extremity supported Standing balance-Leahy Scale: Poor                             ADL either performed or assessed with clinical judgement   ADL Overall ADL's : Needs assistance/impaired                     Lower Body Dressing: Total assistance;Bed level Lower Body Dressing Details (indicate cue type and reason): donning socks             Functional mobility during ADLs: Minimal assistance;Moderate assistance;+2 for physical assistance;+2 for safety/equipment General ADL Comments: pt with decreased participation in ADL today, initially requesting to get OOB to recliner and then later in session refusing, did stand from EOB for changing of bed pads as linens were soiled/saturated in urine     Vision       Perception     Praxis      Cognition Arousal/Alertness: Awake/alert Behavior During Therapy: WFL for tasks assessed/performed Overall Cognitive Status: No family/caregiver present to determine baseline cognitive functioning                                 General Comments: pt "feels bad"         Exercises     Shoulder Instructions       General Comments VSS    Pertinent Vitals/ Pain       Pain Assessment: Faces Faces  Pain Scale: Hurts even more Pain Location: generalized, LEs, back Pain Descriptors / Indicators: Discomfort;Moaning;Sore Pain Intervention(s): Monitored during session;Limited activity within patient's tolerance;Patient requesting pain meds-RN notified  Home Living                                          Prior Functioning/Environment              Frequency  Min 2X/week        Progress Toward Goals  OT Goals(current goals can now be found in the care plan section)  Progress towards OT goals: Progressing toward goals  Acute Rehab OT Goals Patient Stated Goal: none stated OT Goal  Formulation: With patient Time For Goal Achievement: 06/21/18  Plan Discharge plan remains appropriate    Co-evaluation                 AM-PAC OT "6 Clicks" Daily Activity     Outcome Measure   Help from another person eating meals?: A Little Help from another person taking care of personal grooming?: A Little Help from another person toileting, which includes using toliet, bedpan, or urinal?: A Lot Help from another person bathing (including washing, rinsing, drying)?: A Lot Help from another person to put on and taking off regular upper body clothing?: A Lot Help from another person to put on and taking off regular lower body clothing?: Total 6 Click Score: 13    End of Session    OT Visit Diagnosis: Unsteadiness on feet (R26.81);Other abnormalities of gait and mobility (R26.89);Muscle weakness (generalized) (M62.81)   Activity Tolerance Treatment limited secondary to agitation   Patient Left in bed;with call bell/phone within reach;with chair alarm set   Nurse Communication Mobility status        Time: 4098-1191 OT Time Calculation (min): 22 min  Charges: OT General Charges $OT Visit: 1 Visit OT Treatments $Self Care/Home Management : 8-22 mins  Marcy Siren, OT Supplemental Rehabilitation Services Pager [phone] Office [phone]    Orlando Penner 06/17/2018, 11:43 AM

## 2018-06-17 NOTE — Progress Notes (Signed)
PROGRESS NOTE    Anne Black  FKC:[id number] DOB: 1960/08/25 DOA: 06/13/2018 PCP: Patient, No Pcp Per    Brief Narrative:  58 year old female who presented after a fall. She does have significant past medical history for paroxysmal atrial fibrillation, alcohol dependence, alcoholic liver cirrhosis, COPD with chronic hypoxic respiratory failure, depression, bipolar and tobacco abuse. Apparently about 24 hours prior to hospitalization, he fell from a chair, she was unable to stand back up on her feet, andwas found by her daughter the following day and EMS was called. Patient was somnolent unable to give detailed history. Last drink was about 2 days prior to hospitalization.On her initial physical examination blood pressure 116/71, pulse rate 141, respiratory rate 16, temperature 97.6, oxygen saturation 93%. Her lungs were clear to auscultation bilaterally, heart S1-S2 present, tachycardic, her abdomen was distended, nontender, +2+ lower extremity edema, she was noted to be somnolent, slight tremor of her hands without frank asterixis. Her EKG was 140 bpm, with normal axis, narrow QRS, 2:1 flutter, no significant ST segment or T wave changes.  Patient was admitted to the hospital with a working diagnosis of atrial flutter with rapid ventricular response, complicated by alcohol withdrawal syndrome.   Assessment & Plan:   Principal Problem:   Atrial fibrillation with RVR (HCC) Active Problems:   Tobacco abuse   Alcohol use disorder, severe, dependence (HCC)   Alcoholic cirrhosis of liver with ascites (HCC)   Thrombocytopenia (HCC)   COPD (chronic obstructive pulmonary disease) (HCC)  1. Atrial flutter with rapid ventricular response. Rate control with diltiazem 240 mg ER, continue telemetry monitoring.  2. Alcohol withdrawal syndrome. Continue to have agitation specially at night, she has received 10 mg of lorazepam over last 24 H, per CIWA protocol. Continue with chlordiazepoxide  10 mg tid. Continue as needed haldol and morphine.  3. Alcoholic cirrhosis.SP large volume paracentesis, 6,1 L (05/06) Continue diuretic therapy with spironolactone up to 50 mg daily.   4. Hypokalemia/ hypomagensemia.  Corrected electrolytes, K to day at 4,1 and Mg at 2,0 with a preserved renal function. Off IV fluids and tolerating po well.   4. COPD.No current exacerbation.  5. Obesity. BMI is 37.9  6. Back pain. Tolerating well flexeril and IV morphine for pain control.Out of bed tid with meals and physical therapy evaluation. Will add tid ibuprofen and scheduled acetaminophen.   DVT prophylaxis:scd Code Status:full Family Communication:no family at the bedside Disposition Plan/ discharge barriers:Transfer to cardiac telemetry.   Body mass index is 38.31 kg/m. Malnutrition Type:      Malnutrition Characteristics:      Nutrition Interventions:     RN Pressure Injury Documentation:     Consultants:   IR  Procedures:   Large volume paracentesis.   Antimicrobials:       Subjective: Patient last night continue to complain of pain, and positive agitation. This am she is more awake and interactive, improved appetite. Continue to have back pain, no nausea or vomiting.   Objective: Vitals:   06/17/18 0100 06/17/18 0446 06/17/18 0500 06/17/18 0600  BP: (!) 117/50   107/68  Pulse: 83   82  Resp: 12   14  Temp:  97.6 F (36.4 C)    TempSrc:  Oral    SpO2: 96%   100%  Weight:   95 kg   Height:        Intake/Output Summary (Last 24 hours) at 06/17/2018 0842 Last data filed at 06/16/2018 1741 Gross per 24 hour  Intake 365.28  ml  Output 600 ml  Net -234.72 ml   Filed Weights   06/15/18 0417 06/16/18 0500 06/17/18 0500  Weight: 93.9 kg 93.7 kg 95 kg    Examination:   General: deconditioned  Neurology: Awake and alert, non focal  E ENT: mild pallor, no icterus, oral mucosa moist Cardiovascular: No JVD. S1-S2 present, rhythmic, no  gallops, rubs, or murmurs. No lower extremity edema. Pulmonary: positive breath sounds bilaterally, adequate air movement, no wheezing, rhonchi or rales. Gastrointestinal. Abdomen distended with no organomegaly, non tender, no rebound or guarding/ dull to percussion at the dependent zones.  Skin. No rashes Musculoskeletal: no joint deformities     Data Reviewed: I have personally reviewed following labs and imaging studies  CBC: Recent Labs  Lab 06/11/18 2138 06/13/18 2002 06/14/18 0332 06/15/18 0333  WBC 4.6 3.7* 3.4* 3.8*  NEUTROABS 3.1 2.6  --  2.4  HGB 8.4* 9.2* 8.2* 8.7*  HCT 29.9* 32.1* 29.2* 30.3*  MCV 81.3 81.1 81.6 80.4  PLT PLATELET CLUMPS NOTED ON SMEAR, UNABLE TO ESTIMATE 118* 103* 97*   Basic Metabolic Panel: Recent Labs  Lab 06/13/18 2002 06/14/18 0332 06/15/18 0333 06/16/18 0332 06/17/18 0158  NA 133* 134* 130* 132* 133*  K 3.4* 3.2* 4.2 4.0 4.1  CL 94* 98 96* 99 97*  CO2 29 27 27 26 30   GLUCOSE 101* 105* 99 114* 104*  BUN 7 7 6  5* <5*  CREATININE 0.70 0.50 0.51 0.45 0.43*  CALCIUM 8.0* 7.3* 7.7* 7.7* 8.0*  MG  --   --  1.6* 1.6* 2.0   GFR: Estimated Creatinine Clearance: 82.4 mL/min (A) (by C-G formula based on SCr of 0.43 mg/dL (L)). Liver Function Tests: Recent Labs  Lab 06/11/18 2138 06/13/18 2002  AST 44* 62*  ALT 24 27  ALKPHOS 148* 149*  BILITOT 3.1* 3.2*  PROT 6.7 6.5  ALBUMIN 2.7* 2.7*   Recent Labs  Lab 06/11/18 2138 06/13/18 2002  LIPASE 28 28   Recent Labs  Lab 06/13/18 2003  AMMONIA 28   Coagulation Profile: Recent Labs  Lab 06/13/18 2002  INR 1.5*   Cardiac Enzymes: Recent Labs  Lab 06/13/18 2002 06/14/18 0332  CKTOTAL 595* 623*   BNP (last 3 results) No results for input(s): PROBNP in the last 8760 hours. HbA1C: No results for input(s): HGBA1C in the last 72 hours. CBG: No results for input(s): GLUCAP in the last 168 hours. Lipid Profile: No results for input(s): CHOL, HDL, LDLCALC, TRIG, CHOLHDL,  LDLDIRECT in the last 72 hours. Thyroid Function Tests: No results for input(s): TSH, T4TOTAL, FREET4, T3FREE, THYROIDAB in the last 72 hours. Anemia Panel: No results for input(s): VITAMINB12, FOLATE, FERRITIN, TIBC, IRON, RETICCTPCT in the last 72 hours.    Radiology Studies: I have reviewed all of the imaging during this hospital visit personally     Scheduled Meds: . chlordiazePOXIDE  10 mg Oral TID  . Chlorhexidine Gluconate Cloth  6 each Topical Daily  . diltiazem  120 mg Oral Daily  . mouth rinse  15 mL Mouth Rinse BID  . nicotine  21 mg Transdermal Daily  . pantoprazole  40 mg Oral Daily  . sodium chloride flush  10-40 mL Intracatheter Q12H  . sodium chloride flush  3 mL Intravenous Q12H  . spironolactone  50 mg Oral Daily  . thiamine  100 mg Oral Daily   Or  . thiamine  100 mg Intravenous Daily   Continuous Infusions: . sodium chloride Stopped (06/16/18 1218)  LOS: 4 days        California Huberty Gerome Apley, MD

## 2018-06-17 NOTE — Progress Notes (Signed)
Report called to receiving Rn 1432. Will transfer in bed. Patient with no complaints at the current time.

## 2018-06-18 LAB — BASIC METABOLIC PANEL
Anion gap: 8 (ref 5–15)
BUN: 6 mg/dL (ref 6–20)
CO2: 27 mmol/L (ref 22–32)
Calcium: 7.8 mg/dL — ABNORMAL LOW (ref 8.9–10.3)
Chloride: 99 mmol/L (ref 98–111)
Creatinine, Ser: 0.55 mg/dL (ref 0.44–1.00)
GFR calc Af Amer: >60 mL/min (ref >60)
GFR calc non Af Amer: >60 mL/min (ref >60)
Glucose, Bld: 97 mg/dL (ref 70–99)
Potassium: 3.9 mmol/L (ref 3.5–5.1)
Sodium: 134 mmol/L — ABNORMAL LOW (ref 135–145)

## 2018-06-18 MED ORDER — FUROSEMIDE 40 MG PO TABS
40.0000 mg | ORAL_TABLET | Freq: Two times a day (BID) | ORAL | Status: DC
  Administered 2018-06-18 – 2018-06-21 (×6): 40 mg via ORAL
  Filled 2018-06-18 (×6): qty 1

## 2018-06-18 NOTE — Progress Notes (Addendum)
PROGRESS NOTE    Anne Black  NTI:[id number] DOB: [birth year] DOA: 06/13/2018 PCP: Patient, No Pcp Per    Brief Narrative:  58 year old female who presented after a fall. She does have significant past medical history for paroxysmal atrial fibrillation, alcohol dependence, alcoholic liver cirrhosis, COPD with chronic hypoxic respiratory failure, depression, bipolar and tobacco abuse. Apparently about 24 hours prior to hospitalization, he fell from a chair, she was unable to stand back up on her feet, andwas found by her daughter the following day and EMS was called. Patient was somnolent unable to give detailed history. Last drink was about 2 days prior to hospitalization.On her initial physical examination blood pressure 116/71, pulse rate 141, respiratory rate 16, temperature 97.6, oxygen saturation 93%. Her lungs were clear to auscultation bilaterally, heart S1-S2 present, tachycardic, her abdomen was distended, nontender, +2+ lower extremity edema, she was noted to be somnolent, slight tremor of her hands without frank asterixis. Her EKG was 140 bpm, with normal axis, narrow QRS, 2:1 flutter, no significant ST segment or T wave changes.  Patient was admitted to the hospital with a working diagnosis of atrial flutter with rapid ventricular response, complicated by alcohol withdrawal syndrome.   Assessment & Plan:   Principal Problem:   Atrial fibrillation with RVR (HCC) Active Problems:   Tobacco abuse   Alcohol use disorder, severe, dependence (HCC)   Alcoholic cirrhosis of liver with ascites (HCC)   Thrombocytopenia (HCC)   COPD (chronic obstructive pulmonary disease) (HCC)  1. Atrial flutter with rapid ventricular response.Continue with appropriate rate control with diltiazem 240 mg ER. Has remained sinus on telemetry.   2. Alcohol withdrawal syndrome.Continue to have agitation specially at night. Her benzodiazepine requirements have decreased to 4 mg over last 24 H, and  2 mg of haldol, will continue with chlordiazepoxide 10 mg tid and as needed haldol. Continue pain control with IV morphine.  3. Alcoholic cirrhosis.SP large volume paracentesis, 6,1 L(05/06)Worsening edema of her lower extremities, will resume furosemide 40 mg daily, continue  spironolactone 50 mg daily. Her renal function is stable with serum cr at 0,55.   4. Hypokalemia/ hypomagensemia.  her electrolytes have improved, serum K is 3,9, Na at 134 and serum bicarbonate at 27. Will follow on renal panel in am. Resume furosemide and continue aldactone.   4. COPD.No current exacerbation.  5. Obesity. BMI is 37.9  6. Back pain.Continue pain control and physical therapy, patient will need SNF at discharge.   DVT prophylaxis:scd Code Status:full Family Communication:no family at the bedside Disposition Plan/ discharge barriers:pending placement at snf.   Body mass index is 37.06 kg/m. Malnutrition Type:      Malnutrition Characteristics:      Nutrition Interventions:     RN Pressure Injury Documentation:     Consultants:     Procedures:     Antimicrobials:       Subjective: Patient is tolerating po well, her back pain has improved and has required less benzodiazepines, no nausea or vomiting.   Objective: Vitals:   06/17/18 1600 06/17/18 2001 06/18/18 0007 06/18/18 0413  BP: (!) 107/56 (!) 109/54 (!) 100/52 (!) 110/55  Pulse: 76 79 78 83  Resp:  12 12 12   Temp:  98.3 F (36.8 C) 98 F (36.7 C) 98 F (36.7 C)  TempSrc:  Axillary Axillary Axillary  SpO2:  94% 94% 94%  Weight:    91.9 kg  Height:        Intake/Output Summary (Last 24 hours) at 06/18/2018  1251 Last data filed at 06/18/2018 0757 Gross per 24 hour  Intake 250 ml  Output 300 ml  Net -50 ml   Filed Weights   06/16/18 0500 06/17/18 0500 06/18/18 0413  Weight: 93.7 kg 95 kg 91.9 kg    Examination:   General: Not in pain or dyspnea, deconditioned  Neurology: somnolent  but easy to arouse,  E ENT: mild pallor, no icterus, oral mucosa moist Cardiovascular: No JVD. S1-S2 present, rhythmic, no gallops, rubs, or murmurs. +++ piting lower extremity edema. Pulmonary: p[ositive breath sounds bilaterally, adequate air movement, no wheezing, rhonchi or rales. Gastrointestinal. Abdomen protuberant with no organomegaly, non tender, no rebound or guarding Skin. No rashes Musculoskeletal: no joint deformities     Data Reviewed: I have personally reviewed following labs and imaging studies  CBC: Recent Labs  Lab 06/11/18 2138 06/13/18 2002 06/14/18 0332 06/15/18 0333  WBC 4.6 3.7* 3.4* 3.8*  NEUTROABS 3.1 2.6  --  2.4  HGB 8.4* 9.2* 8.2* 8.7*  HCT 29.9* 32.1* 29.2* 30.3*  MCV 81.3 81.1 81.6 80.4  PLT PLATELET CLUMPS NOTED ON SMEAR, UNABLE TO ESTIMATE 118* 103* 97*   Basic Metabolic Panel: Recent Labs  Lab 06/14/18 0332 06/15/18 0333 06/16/18 0332 06/17/18 0158 06/18/18 0427  NA 134* 130* 132* 133* 134*  K 3.2* 4.2 4.0 4.1 3.9  CL 98 96* 99 97* 99  CO2 27 27 26 30 27   GLUCOSE 105* 99 114* 104* 97  BUN 7 6 5* <5* 6  CREATININE 0.50 0.51 0.45 0.43* 0.55  CALCIUM 7.3* 7.7* 7.7* 8.0* 7.8*  MG  --  1.6* 1.6* 2.0  --    GFR: Estimated Creatinine Clearance: 80.8 mL/min (by C-G formula based on SCr of 0.55 mg/dL). Liver Function Tests: Recent Labs  Lab 06/11/18 2138 06/13/18 2002  AST 44* 62*  ALT 24 27  ALKPHOS 148* 149*  BILITOT 3.1* 3.2*  PROT 6.7 6.5  ALBUMIN 2.7* 2.7*   Recent Labs  Lab 06/11/18 2138 06/13/18 2002  LIPASE 28 28   Recent Labs  Lab 06/13/18 2003  AMMONIA 28   Coagulation Profile: Recent Labs  Lab 06/13/18 2002  INR 1.5*   Cardiac Enzymes: Recent Labs  Lab 06/13/18 2002 06/14/18 0332  CKTOTAL 595* 623*   BNP (last 3 results) No results for input(s): PROBNP in the last 8760 hours. HbA1C: No results for input(s): HGBA1C in the last 72 hours. CBG: No results for input(s): GLUCAP in the last 168 hours.  Lipid Profile: No results for input(s): CHOL, HDL, LDLCALC, TRIG, CHOLHDL, LDLDIRECT in the last 72 hours. Thyroid Function Tests: No results for input(s): TSH, T4TOTAL, FREET4, T3FREE, THYROIDAB in the last 72 hours. Anemia Panel: No results for input(s): VITAMINB12, FOLATE, FERRITIN, TIBC, IRON, RETICCTPCT in the last 72 hours.    Radiology Studies: I have reviewed all of the imaging during this hospital visit personally     Scheduled Meds: . acetaminophen  650 mg Oral Q6H  . chlordiazePOXIDE  10 mg Oral TID  . diltiazem  120 mg Oral Daily  . ibuprofen  400 mg Oral TID  . mouth rinse  15 mL Mouth Rinse BID  . nicotine  21 mg Transdermal Daily  . pantoprazole  40 mg Oral Daily  . sodium chloride flush  10-40 mL Intracatheter Q12H  . sodium chloride flush  3 mL Intravenous Q12H  . spironolactone  50 mg Oral Daily  . thiamine  100 mg Oral Daily   Or  . thiamine  100 mg Intravenous Daily   Continuous Infusions: . sodium chloride Stopped (06/16/18 1218)     LOS: 5 days         Annett Gulaaniel , MD

## 2018-06-19 LAB — CBC WITH DIFFERENTIAL/PLATELET
Abs Immature Granulocytes: 0.01 10*3/uL (ref 0.00–0.07)
Basophils Absolute: 0.1 10*3/uL (ref 0.0–0.1)
Basophils Relative: 2 %
Eosinophils Absolute: 0.1 10*3/uL (ref 0.0–0.5)
Eosinophils Relative: 1 %
HCT: 27.8 % — ABNORMAL LOW (ref 36.0–46.0)
Hemoglobin: 8 g/dL — ABNORMAL LOW (ref 12.0–15.0)
Immature Granulocytes: 0 %
Lymphocytes Relative: 18 %
Lymphs Abs: 0.7 10*3/uL (ref 0.7–4.0)
MCH: 23.8 pg — ABNORMAL LOW (ref 26.0–34.0)
MCHC: 28.8 g/dL — ABNORMAL LOW (ref 30.0–36.0)
MCV: 82.7 fL (ref 80.0–100.0)
Monocytes Absolute: 0.4 10*3/uL (ref 0.1–1.0)
Monocytes Relative: 11 %
Neutro Abs: 2.4 10*3/uL (ref 1.7–7.7)
Neutrophils Relative %: 68 %
Platelets: 101 10*3/uL — ABNORMAL LOW (ref 150–400)
RBC: 3.36 MIL/uL — ABNORMAL LOW (ref 3.87–5.11)
RDW: 23.8 % — ABNORMAL HIGH (ref 11.5–15.5)
WBC: 3.6 10*3/uL — ABNORMAL LOW (ref 4.0–10.5)
nRBC: 0 % (ref 0.0–0.2)

## 2018-06-19 LAB — BASIC METABOLIC PANEL
Anion gap: 7 (ref 5–15)
BUN: 9 mg/dL (ref 6–20)
CO2: 27 mmol/L (ref 22–32)
Calcium: 7.8 mg/dL — ABNORMAL LOW (ref 8.9–10.3)
Chloride: 100 mmol/L (ref 98–111)
Creatinine, Ser: 0.69 mg/dL (ref 0.44–1.00)
GFR calc Af Amer: >60 mL/min (ref >60)
GFR calc non Af Amer: >60 mL/min (ref >60)
Glucose, Bld: 97 mg/dL (ref 70–99)
Potassium: 4.1 mmol/L (ref 3.5–5.1)
Sodium: 134 mmol/L — ABNORMAL LOW (ref 135–145)

## 2018-06-19 LAB — CULTURE, BODY FLUID W GRAM STAIN -BOTTLE: Culture: NO GROWTH

## 2018-06-19 NOTE — Progress Notes (Signed)
PT Cancellation Note  Patient Details Name: Anne Black MRN: [id number] DOB: Feb 27, 1960   Cancelled Treatment:    Reason Eval/Treat Not Completed: Pain limiting ability to participate - Pt refused OOB mobility and bed-level exercise today, even with max verbal encouragement, due to back and hip pain. Pt states "you don't even know what pain I feel, I am not doing it (PT) today." PT to continue to follow.  Nicola Police, PT Acute Rehabilitation Services Pager [phone]  Office [phone]    Tyrone Apple D Despina Hidden 06/19/2018, 1:26 PM

## 2018-06-19 NOTE — NC FL2 (Signed)
[place] MEDICAID FL2 LEVEL OF CARE SCREENING TOOL     IDENTIFICATION  Patient Name: Anne Black Birthdate: 09-23-1960 Sex: female Admission Date (Current Location): 06/13/2018  [facility] and [place] Number:  Producer, television/film/video and Address:  [facility],  [address] [place], [place] [address]      Provider Number: [id number]  Attending Physician Name and Address:  Coralie Keens  Relative Name and Phone Number:  (daughter) Raeesah Mariconda [phone]    Current Level of Care: Hospital Recommended Level of Care: Skilled Nursing Facility Prior Approval Number:    Date Approved/Denied:   PASRR Number:    Discharge Plan: SNF    Current Diagnoses: Patient Active Problem List   Diagnosis Date Noted  . Atrial fibrillation with RVR (HCC) 06/13/2018  . Alcohol dependence (HCC) 05/06/2018  . Electrolyte disturbance 05/05/2018  . Alcohol withdrawal delirium (HCC) 04/28/2018  . Weakness generalized 04/27/2018  . Gait abnormality 04/24/2018  . Chronic hypoxemic respiratory failure (HCC) 04/11/2018  . Atrial fibrillation with rapid ventricular response (HCC) 03/23/2018  . Head injury   . Fall 03/19/2018  . Syncope 03/19/2018  . Orbital fracture 03/19/2018  . Scalp laceration 03/19/2018  . Acute respiratory failure with hypoxia and hypercarbia (HCC) 03/19/2018  . Abdominal tenderness 03/19/2018  . Acute on chronic respiratory failure with hypoxia (HCC) 03/17/2018  . Alcohol abuse with intoxication (HCC) 03/17/2018  . ETOH abuse   . Normocytic anemia 03/16/2018  . Acute metabolic encephalopathy 03/16/2018  . Lower leg edema 03/16/2018  . Migraine 03/16/2018  . Community acquired pneumonia of left lower lobe of lung (HCC)   . Weakness 08/21/2017  . COPD exacerbation (HCC) 06/12/2017  . Chest pain with moderate risk for cardiac etiology 05/30/2017  . Abnormal LFTs 05/30/2017  . Tachycardia 05/30/2017  . COPD (chronic obstructive pulmonary disease)  (HCC) 04/19/2017  . Right rib fracture 04/15/2017  . Abdominal wall cellulitis 12/30/2016  . S/P exploratory laparotomy 12/30/2016  . Distal radius fracture, left 09/09/2016  . Thrombocytopenia (HCC) 09/09/2016  . Chest x-ray abnormality   . Umbilical hernia without obstruction and without gangrene 05/12/2016  . Itching 02/24/2016  . Ventral hernia without obstruction or gangrene 02/24/2016  . Palliative care encounter   . Ascites   . Tachypnea   . Atrial flutter with rapid ventricular response (HCC)   . Hypomagnesemia   . Hypoxia   . Dehydration 01/22/2016  . Low back ache 12/30/2015  . Non-intractable vomiting with nausea   . Alcohol abuse with alcohol-induced mood disorder (HCC) 12/15/2015  . Acute alcoholism (HCC)   . Abdominal distension 11/04/2015  . Alcohol abuse   . Dyspnea   . Acute respiratory failure (HCC) 10/15/2015  . Ascites due to alcoholic cirrhosis (HCC) 10/15/2015  . Osteoporosis   . Multiple sclerosis (HCC)   . Alcoholic cirrhosis of liver with ascites (HCC) 08/22/2015  . Bipolar disorder, current episode mixed, moderate (HCC) 08/01/2015  . Alcohol use disorder, severe, dependence (HCC) 07/31/2015  . Macrocytic anemia- due to alcohol abuse with normal B12 & folate levels 05/31/2015  . Severe protein-calorie malnutrition (HCC) 05/31/2015  . Hypokalemia 05/26/2015  . Encephalopathy, hepatic (HCC) 05/26/2015  . Alcohol withdrawal (HCC) 05/18/2015  . Abdominal pain 05/18/2015  . Anxiety 05/18/2015  . Stimulant abuse (HCC) 12/27/2014  . Nicotine abuse 12/27/2014  . Hyperprolactinemia (HCC) 11/15/2014  . Dyslipidemia   . Alcohol use disorder, moderate, dependence (HCC) 10/30/2014  . Tobacco abuse 01/23/2014  . Bipolar 1 disorder (HCC) 04/07/2013  .  Noncompliance with therapeutic plan 04/04/2013  . Hereditary and idiopathic peripheral neuropathy 05/01/2012  . Depression 05/01/2012  . GERD (gastroesophageal reflux disease) 05/01/2012  . Osteoarthrosis,  unspecified whether generalized or localized, involving lower leg 05/01/2012  . Hyponatremia 07/01/2011    Orientation RESPIRATION BLADDER Height & Weight     Self, Place  O2(2lpm Chronic O2) Incontinent Weight: 92 kg Height:  5\' 2"  (157.5 cm)  BEHAVIORAL SYMPTOMS/MOOD NEUROLOGICAL BOWEL NUTRITION STATUS  (N/A) (N/A) Continent Diet(regular)  AMBULATORY STATUS COMMUNICATION OF NEEDS Skin     Verbally Bruising(bilateral arms, hands, legs)                       Personal Care Assistance Level of Assistance  Bathing, Feeding, Dressing Bathing Assistance: Maximum assistance Feeding assistance: Independent Dressing Assistance: Maximum assistance     Functional Limitations Info  Sight, Hearing, Speech Sight Info: Adequate Hearing Info: Adequate Speech Info: Adequate    SPECIAL CARE FACTORS FREQUENCY  PT (By licensed PT), OT (By licensed OT)     PT Frequency: 5 days/week OT Frequency: 5 days/week            Contractures Contractures Info: Not present    Additional Factors Info  Code Status, Allergies, Psychotropic Code Status Info: full Allergies Info: no know allergies Psychotropic Info: librium,          Current Medications (06/19/2018):  This is the current hospital active medication list Current Facility-Administered Medications  Medication Dose Route Frequency Provider Last Rate Last Dose  . 0.9 %  sodium chloride infusion   Intravenous PRN Arrien, York Ram, MD   Stopped at 06/16/18 1218  . acetaminophen (TYLENOL) tablet 650 mg  650 mg Oral Q6H Arrien, York Ram, MD   650 mg at 06/19/18 0509  . albuterol (PROVENTIL) (2.5 MG/3ML) 0.083% nebulizer solution 2.5 mg  2.5 mg Nebulization Q4H PRN Darreld Mclean R, MD      . chlordiazePOXIDE (LIBRIUM) capsule 10 mg  10 mg Oral TID Coralie Keens, MD   10 mg at 06/19/18 0254  . cyclobenzaprine (FLEXERIL) tablet 5 mg  5 mg Oral TID PRN Arrien, York Ram, MD   5 mg at 06/19/18 1029  .  diltiazem (CARDIZEM CD) 24 hr capsule 120 mg  120 mg Oral Daily Darreld Mclean R, MD   120 mg at 06/19/18 2706  . diphenhydrAMINE (BENADRYL) capsule 25-50 mg  25-50 mg Oral Q4H PRN Schorr, Roma Kayser, NP   50 mg at 06/18/18 2344  . furosemide (LASIX) tablet 40 mg  40 mg Oral BID Coralie Keens, MD   40 mg at 06/19/18 2376  . haloperidol (HALDOL) tablet 1 mg  1 mg Oral Q4H PRN Arrien, York Ram, MD   1 mg at 06/17/18 1716   Or  . haloperidol lactate (HALDOL) injection 1 mg  1 mg Intramuscular Q4H PRN Arrien, York Ram, MD      . ibuprofen (ADVIL) tablet 400 mg  400 mg Oral TID Coralie Keens, MD   400 mg at 06/19/18 2831  . LORazepam (ATIVAN) injection 2-3 mg  2-3 mg Intravenous Q1H PRN Charlsie Quest, MD   2 mg at 06/18/18 0056  . MEDLINE mouth rinse  15 mL Mouth Rinse BID Arrien, York Ram, MD   15 mL at 06/19/18 0814  . morphine 2 MG/ML injection 2 mg  2 mg Intravenous Q2H PRN Arrien, York Ram, MD   2 mg at 06/18/18 2343  . nicotine (NICODERM  CQ - dosed in mg/24 hours) patch 21 mg  21 mg Transdermal Daily Charlsie Quest, MD   21 mg at 06/19/18 0813  . pantoprazole (PROTONIX) EC tablet 40 mg  40 mg Oral Daily Arrien, York Ram, MD   40 mg at 06/19/18 1610  . sodium chloride flush (NS) 0.9 % injection 10-40 mL  10-40 mL Intracatheter Q12H Arrien, York Ram, MD   10 mL at 06/19/18 0814  . sodium chloride flush (NS) 0.9 % injection 10-40 mL  10-40 mL Intracatheter PRN Arrien, York Ram, MD   10 mL at 06/17/18 1351  . sodium chloride flush (NS) 0.9 % injection 3 mL  3 mL Intravenous Q12H Darreld Mclean R, MD   3 mL at 06/19/18 0813  . spironolactone (ALDACTONE) tablet 50 mg  50 mg Oral Daily Arrien, York Ram, MD   50 mg at 06/19/18 9604  . thiamine (VITAMIN B-1) tablet 100 mg  100 mg Oral Daily Rhunette Croft, Ankit, MD   100 mg at 06/19/18 5409   Or  . thiamine (B-1) injection 100 mg  100 mg Intravenous Daily Derwood Kaplan, MD   100 mg  at 06/16/18 8119     Discharge Medications: Please see discharge summary for a list of discharge medications.  Relevant Imaging Results:  Relevant Lab Results:   Additional Information SSN: [id number]  Malcolm Metro, RN

## 2018-06-19 NOTE — Care Management Important Message (Signed)
Important Message  Patient Details IM Letter given to Cookie McGiboney to present to the Patient Name: KRYSTALE BIONDI MRN: [id number] Date of Birth: 1960/02/10   Medicare Important Message Given:  Yes    Caren Macadam 06/19/2018, 11:15 AM

## 2018-06-19 NOTE — Progress Notes (Signed)
PROGRESS NOTE    Anne Black  HDQ:[id number] DOB: 12-17-1960 DOA: 06/13/2018 PCP: Patient, No Pcp Per    Brief Narrative:  58 year old female who presented after a fall. She does have significant past medical history for paroxysmal atrial fibrillation, alcohol dependence, alcoholic liver cirrhosis, COPD with chronic hypoxic respiratory failure, depression, bipolar and tobacco abuse. Apparently about 24 hours prior to hospitalization, he fell from a chair, she was unable to stand back up on her feet, andwas found by her daughter the following day and EMS was called. Patient was somnolent unable to give detailed history. Last drink was about 2 days prior to hospitalization.On her initial physical examination blood pressure 116/71, pulse rate 141, respiratory rate 16, temperature 97.6, oxygen saturation 93%. Her lungs were clear to auscultation bilaterally, heart S1-S2 present, tachycardic, her abdomen was distended, nontender, +2+ lower extremity edema, she was noted to be somnolent, slight tremor of her hands without frank asterixis. Her EKG was 140 bpm, with normal axis, narrow QRS, 2:1 flutter, no significant ST segment or T wave changes.  Patient was admitted to the hospital with a working diagnosis of atrial flutter with rapid ventricular response, complicated by alcohol withdrawal syndrome   Assessment & Plan:   Principal Problem:   Atrial fibrillation with RVR (HCC) Active Problems:   Tobacco abuse   Alcohol use disorder, severe, dependence (HCC)   Alcoholic cirrhosis of liver with ascites (HCC)   Thrombocytopenia (HCC)   COPD (chronic obstructive pulmonary disease) (HCC)  1. Atrial flutter with rapid ventricular response.On diltiazem 240 mg ER., patient has been on sinus rhythm.    2. Alcohol withdrawal syndrome.She has required only 2 mg of lorazepam over last 24 H, and 0 mg of haldol. Tolerating wellchlordiazepoxide 10mg  tid.   3. Alcoholic cirrhosis.SP large  volume paracentesis, 6,1 L(05/06)Improved lower extremity edema, but not back to baseline, continue diuretic therapy with furosemide and aldactone  4. Hypokalemia/ hypomagensemia.Her serum K is 4,1 this am, with preserved renal function, will continue diuretic therapy for now. Will hold on BMP for am, check in 48 H.   4. COPD.Noclinical signs of currentexacerbation.  5. Obesity. BMI is 37.9  6. Back pain.Continue pain control with scheduled ibuprofen, acetaminophen and as needed IV morphine. Continue therapy at SNF.   DVT prophylaxis:scd Code Status:full Family Communication:no family at the bedside Disposition Plan/ discharge barriers:patient is waiting for placement at SNF.   Body mass index is 37.1 kg/m. Malnutrition Type:      Malnutrition Characteristics:      Nutrition Interventions:     RN Pressure Injury Documentation:     Consultants:   Procedures:   Large volume paracentesis 6 L  Antimicrobials:       Subjective: Patient is feeling better, more calm and less anxious, continue to complain of back pain, no nausea or vomiting.   Objective: Vitals:   06/18/18 2026 06/18/18 2347 06/19/18 0410 06/19/18 0507  BP: 106/60 111/65 (!) 100/51   Pulse: 74 77 80   Resp: 14 12 12 18   Temp: 97.7 F (36.5 C) 97.7 F (36.5 C) 97.7 F (36.5 C)   TempSrc: Oral Oral Oral   SpO2: 97% 99% 100%   Weight:   92 kg   Height:        Intake/Output Summary (Last 24 hours) at 06/19/2018 1036 Last data filed at 06/19/2018 0900 Gross per 24 hour  Intake 480 ml  Output 1050 ml  Net -570 ml   Filed Weights   06/17/18 0500  06/18/18 0413 06/19/18 0410  Weight: 95 kg 91.9 kg 92 kg    Examination:   General: Not in pain or dyspnea, deconditioned and ill looking appearing Neurology: Awake and alert, non focal  E ENT: positive pallor, no icterus, oral mucosa moist Cardiovascular: No JVD. S1-S2 present, rhythmic, no gallops, rubs, or murmurs. ++  pitting bilateral lower extremity edema. Pulmonary: positive breath sounds bilaterally, adequate air movement, no wheezing, rhonchi or rales. Gastrointestinal. Abdomen distended, dull to percussion at dependent zones, no organomegaly, non tender, no rebound or guarding Skin. No rashes Musculoskeletal: no joint deformities     Data Reviewed: I have personally reviewed following labs and imaging studies  CBC: Recent Labs  Lab 06/13/18 2002 06/14/18 0332 06/15/18 0333 06/19/18 0345  WBC 3.7* 3.4* 3.8* 3.6*  NEUTROABS 2.6  --  2.4 2.4  HGB 9.2* 8.2* 8.7* 8.0*  HCT 32.1* 29.2* 30.3* 27.8*  MCV 81.1 81.6 80.4 82.7  PLT 118* 103* 97* 101*   Basic Metabolic Panel: Recent Labs  Lab 06/15/18 0333 06/16/18 0332 06/17/18 0158 06/18/18 0427 06/19/18 0345  NA 130* 132* 133* 134* 134*  K 4.2 4.0 4.1 3.9 4.1  CL 96* 99 97* 99 100  CO2 27 26 30 27 27   GLUCOSE 99 114* 104* 97 97  BUN 6 5* <5* 6 9  CREATININE 0.51 0.45 0.43* 0.55 0.69  CALCIUM 7.7* 7.7* 8.0* 7.8* 7.8*  MG 1.6* 1.6* 2.0  --   --    GFR: Estimated Creatinine Clearance: 81 mL/min (by C-G formula based on SCr of 0.69 mg/dL). Liver Function Tests: Recent Labs  Lab 06/13/18 2002  AST 62*  ALT 27  ALKPHOS 149*  BILITOT 3.2*  PROT 6.5  ALBUMIN 2.7*   Recent Labs  Lab 06/13/18 2002  LIPASE 28   Recent Labs  Lab 06/13/18 2003  AMMONIA 28   Coagulation Profile: Recent Labs  Lab 06/13/18 2002  INR 1.5*   Cardiac Enzymes: Recent Labs  Lab 06/13/18 2002 06/14/18 0332  CKTOTAL 595* 623*   BNP (last 3 results) No results for input(s): PROBNP in the last 8760 hours. HbA1C: No results for input(s): HGBA1C in the last 72 hours. CBG: No results for input(s): GLUCAP in the last 168 hours. Lipid Profile: No results for input(s): CHOL, HDL, LDLCALC, TRIG, CHOLHDL, LDLDIRECT in the last 72 hours. Thyroid Function Tests: No results for input(s): TSH, T4TOTAL, FREET4, T3FREE, THYROIDAB in the last 72 hours.  Anemia Panel: No results for input(s): VITAMINB12, FOLATE, FERRITIN, TIBC, IRON, RETICCTPCT in the last 72 hours.    Radiology Studies: I have reviewed all of the imaging during this hospital visit personally     Scheduled Meds: . acetaminophen  650 mg Oral Q6H  . chlordiazePOXIDE  10 mg Oral TID  . diltiazem  120 mg Oral Daily  . furosemide  40 mg Oral BID  . ibuprofen  400 mg Oral TID  . mouth rinse  15 mL Mouth Rinse BID  . nicotine  21 mg Transdermal Daily  . pantoprazole  40 mg Oral Daily  . sodium chloride flush  10-40 mL Intracatheter Q12H  . sodium chloride flush  3 mL Intravenous Q12H  . spironolactone  50 mg Oral Daily  . thiamine  100 mg Oral Daily   Or  . thiamine  100 mg Intravenous Daily   Continuous Infusions: . sodium chloride Stopped (06/16/18 1218)     LOS: 6 days        Rosalba Totty Annett Gulaaniel Byren Pankow,  MD   

## 2018-06-20 MED ORDER — SIMETHICONE 80 MG PO CHEW
160.0000 mg | CHEWABLE_TABLET | Freq: Once | ORAL | Status: AC
  Administered 2018-06-20: 160 mg via ORAL
  Filled 2018-06-20: qty 2

## 2018-06-20 NOTE — Discharge Summary (Addendum)
Physician Discharge Summary  Anne Black PPH:[id number] DOB: 06-20-1960 DOA: 06/13/2018  PCP: Patient, No Pcp Per  Admit date: 06/13/2018 Discharge date: 06/20/2018  Admitted From: Home  Disposition:  SNF   Recommendations for Outpatient Follow-up and new medication changes:  1. Follow up with Primary Care in 7 days.  2. Patient was advised about alcohol cessation.  3. She has declined SNF, but agrees for home health sevices.   Home Health: Yes   Equipment/Devices: No    Discharge Condition: stable  CODE STATUS: full  Diet recommendation: heart healthy   Brief/Interim Summary: 58 year old female who presented after a fall. She does have significant past medical history for paroxysmal atrial fibrillation, alcohol dependence, alcoholic liver cirrhosis, COPD with chronic hypoxic respiratory failure, depression, bipolar and tobacco abuse. Apparently about 24 hours prior to hospitalization, he fell from a chair, she was unable to stand back up on her feet, andwas found by her daughter the following day and EMS was called. Patient was somnolent unable to give detailed history. Last drink was about 2 days prior to hospitalization.On her initial physical examination her blood pressure was 116/71, pulse rate 141, respiratory rate 16, temperature 97.6, oxygen saturation 93%. Her lungs were clear to auscultation bilaterally, heart S1-S2 present, tachycardic, her abdomen was distended, nontender, +2+ lower extremity edema, she was noted to be somnolent, slight tremor of her hands without frank asterixis.  Sodium 133, potassium 3.4, chloride 94, bicarbonate 9, glucose 101, BUN 7, creatinine 0.70, AST 62, ALT 27, CPK 595, white count 3.7, hemoglobin 9.2, hematocrit 32.1, platelets 118,urine analysis negative for infection.  Head CT negative for acute changes.  Her EKG was 140 bpm, with normal axis, narrow QRS, 2:1 flutter, no significant ST segment or T wave changes.  Patient was admitted to the  hospital with a working diagnosis of atrial flutter with rapid ventricular response, complicated by alcohol withdrawal syndrome  1.  Atrial flutter/fibrillation with a rapid ventricular response.  Patient was admitted to the stepdown unit, she received IV diltiazem for rate control with good toleration.  She was successfully transitioned to oral diltiazem 240 mg extended release daily, she converted to sinus rhythm.  She remained on a telemetry monitor while hospitalized.   2.  Alcohol withdrawal syndrome.  Patient received lorazepam per CIWA protocol along with chlordiazepoxide with improvement of her symptoms.  By time of discharge has no tremors or anxiety.  No nausea no vomiting.  She was advised about alcohol cessation.  3.  Alcoholic cirrhosis status post large-volume paracentesis, 6.1 L.  Patient had significant ascites on physical examination, she underwent ultrasound-guided paracentesis, on Jun 14, 2018, 6.1 L of yellow fluid was obtained.  Ascitic fluid white count was 132, no organisms seen on Gram stain.  Patient will continue diuretic therapy with furosemide and spironolactone.  At discharge no signs of encephalopathy, her INR is 1.5  4.  Hypokalemia and hypomagnesemia.  Patient received potassium chloride and magnesium sulfate with good response, charge potassium is 4.1, magnesium 2.0.  Her kidney function remained stable with serum creatinine 0.69.  5.  COPD with chronic hypoxic respiratory failure.  Patient received supplemental oxygen per nasal cannula, no signs of acute exacerbation.  6.  Obesity.  Calculated BMI 37.9.  7.  Chronic back pain.  Patient received analgesics and physical therapy.  Recommendations to be discharged to a skilled nursing facility for 24-hour home supervision with home health services.  Patient has declined skilled nursing facility, this was discussed with her daughter.  Discharge Diagnoses:  Principal Problem:   Atrial fibrillation with RVR (HCC) Active  Problems:   Tobacco abuse   Alcohol use disorder, severe, dependence (HCC)   Alcoholic cirrhosis of liver with ascites (HCC)   Thrombocytopenia (HCC)   COPD (chronic obstructive pulmonary disease) (HCC)    Discharge Instructions   Allergies as of 06/20/2018   No Known Allergies     Medication List    STOP taking these medications   chlordiazePOXIDE 25 MG capsule Commonly known as:  LIBRIUM   ibuprofen 200 MG tablet Commonly known as:  ADVIL   lidocaine 5 % Commonly known as:  Lidoderm   ondansetron 8 MG disintegrating tablet Commonly known as:  Zofran ODT   rizatriptan 5 MG tablet Commonly known as:  MAXALT     TAKE these medications   acetaminophen 500 MG tablet Commonly known as:  TYLENOL Take 1 tablet (500 mg total) by mouth every 6 (six) hours as needed for mild pain. What changed:  how much to take   diltiazem 120 MG 24 hr capsule Commonly known as:  CARDIZEM CD Take 1 capsule (120 mg total) by mouth daily for 30 days.   furosemide 40 MG tablet Commonly known as:  LASIX Take 40 mg twice daily till 04/02/2018, after that take 40 mg daily. What changed:    how much to take  how to take this  when to take this   gabapentin 100 MG capsule Commonly known as:  NEURONTIN Take 2 capsules (200 mg total) by mouth 3 (three) times daily for 30 days.   hydrOXYzine 25 MG tablet Commonly known as:  ATARAX/VISTARIL Take 1 tablet (25 mg total) by mouth daily as needed for up to 30 doses (anxiety). What changed:  reasons to take this   magnesium oxide 400 MG tablet Commonly known as:  MAG-OX Take 400 mg by mouth daily.   ondansetron 4 MG tablet Commonly known as:  ZOFRAN Take 4 mg by mouth every 8 (eight) hours as needed for nausea or vomiting. What changed:  Another medication with the same name was removed. Continue taking this medication, and follow the directions you see here.   pantoprazole 40 MG tablet Commonly known as:  PROTONIX Take 1 tablet (40  mg total) by mouth daily for 30 days.   potassium chloride SA 20 MEQ tablet Commonly known as:  K-DUR Take 2 tablets (40 mEq total) by mouth 2 (two) times daily.   spironolactone 25 MG tablet Commonly known as:  ALDACTONE Take 1 tablet (25 mg total) by mouth daily for 30 days.       No Known Allergies  Consultations:     Procedures/Studies: Ct Head Wo Contrast  Result Date: 06/13/2018 CLINICAL DATA:  Fall altered mental status EXAM: CT HEAD WITHOUT CONTRAST TECHNIQUE: Contiguous axial images were obtained from the base of the skull through the vertex without intravenous contrast. COMPARISON:  CT brain 05/14/2018, 05/12/2018, 05/09/2018 FINDINGS: Brain: No acute territorial infarction, hemorrhage or intracranial mass. Atrophy and moderate small vessel ischemic changes of the white matter. Stable ventricle size. Vascular: No hyperdense vessels.  Carotid vascular calcification Skull: Normal. Negative for fracture or focal lesion. Sinuses/Orbits: No acute finding. Mucosal thickening in the maxillary sinuses. Other: None IMPRESSION: 1. No CT evidence for acute intracranial abnormality. 2. Atrophy and small vessel ischemic changes of the white matter Electronically Signed   By: Jasmine Pang M.D.   On: 06/13/2018 22:01   Dg Pelvis Portable  Result Date: 05/30/2018 CLINICAL  DATA:  58 year old female status post fall from bed. Combative. EXAM: PORTABLE PELVIS 1-2 VIEWS COMPARISON:  CT Abdomen and Pelvis 04/27/2018. FINDINGS: Portable AP supine view at 0915 hours. Mildly rotated to the left. Femoral heads are normally located. Hip joint spaces appear normal. Grossly intact visible proximal femurs. No pelvis fracture identified. Negative visible bowel gas pattern. IMPRESSION: No acute fracture or dislocation identified about the pelvis. Electronically Signed   By: Odessa Fleming M.D.   On: 05/30/2018 09:22   Dg Chest Portable 1 View  Result Date: 05/30/2018 CLINICAL DATA:  Shortness of breath. EXAM:  PORTABLE CHEST 1 VIEW COMPARISON:  Radiograph of May 09, 2018. FINDINGS: Stable cardiomegaly. No pneumothorax or pleural effusion is noted. Right lung is clear. Mild left basilar subsegmental atelectasis or scarring is noted. Bony thorax is unremarkable. IMPRESSION: Mild left basilar subsegmental atelectasis or scarring. Electronically Signed   By: Lupita Raider M.D.   On: 05/30/2018 09:14   Ir Paracentesis  Result Date: 06/14/2018 INDICATION: Patient with history of alcoholic cirrhosis, recurrent ascites. Request made for diagnostic and therapeutic paracentesis. EXAM: ULTRASOUND GUIDED DIAGNOSTIC AND THERAPEUTIC PARACENTESIS MEDICATIONS: None COMPLICATIONS: None immediate. PROCEDURE: Informed written consent was obtained from the patient after a discussion of the risks, benefits and alternatives to treatment. A timeout was performed prior to the initiation of the procedure. Initial ultrasound scanning demonstrates a large amount of ascites within the right lower abdominal quadrant. The right lower abdomen was prepped and draped in the usual sterile fashion. 1% lidocaine was used for local anesthesia. Following this, a 19 gauge, 10-cm, Yueh catheter was introduced. An ultrasound image was saved for documentation purposes. The paracentesis was performed. The catheter was removed and a dressing was applied. The patient tolerated the procedure well without immediate post procedural complication. FINDINGS: A total of approximately 6.1 liters of yellow fluid was removed. Samples were sent to the laboratory as requested by the clinical team. IMPRESSION: Successful ultrasound-guided diagnostic and therapeutic paracentesis yielding 6.1 liters of peritoneal fluid. Read by: Jeananne Rama, PA-C Electronically Signed   By: Gilmer Mor D.O.   On: 06/14/2018 16:40      Procedures: Large volume paracentesis US guided   Subjective: Patient is feeling better, no nausea or vomiting, no anxiety or tremors, her back  pain is controlled and she is requesting to be discharged home, declining SNF.   Discharge Exam: Vitals:   06/20/18 0042 06/20/18 0552  BP: 101/84 (!) 128/46  Pulse: 87 92  Resp: 14 17  Temp: 97.8 F (36.6 C) 98.1 F (36.7 C)  SpO2: 99% 100%   Vitals:   06/19/18 2022 06/20/18 0042 06/20/18 0549 06/20/18 0552  BP: 110/63 101/84  (!) 128/46  Pulse: 79 87  92  Resp: Temp: 97.7 F (36.5 C) 97.8 F (36.6 C)  98.1 F (36.7 C)  TempSrc: Oral Oral  Oral  SpO2: 98% 99%  100%  Weight:   89.9 kg   Height:        General: Not in pain or dyspnea.  Neurology: Awake and alert, non focal  E ENT: no pallor, no icterus, oral mucosa moist Cardiovascular: No JVD. S1-S2 present, rhythmic, no gallops, rubs, or murmurs. ++ pitting lower extremity edema. Pulmonary: positive breath sounds bilaterally, adequate air movement, no wheezing, rhonchi or rales. Gastrointestinal. Abdomen with no organomegaly, non tender, no rebound or guarding, mild distention.  Skin. No rashes Musculoskeletal: no joint deformities   The results of significant diagnostics from this  hospitalization (including imaging, microbiology, ancillary and laboratory) are listed below for reference.     Microbiology: Recent Results (from the past 240 hour(s))  SARS Coronavirus 2 [facility] order, Performed in [facility] hospital lab)     Status: None   Collection Time: 06/13/18 11:30 AM  Result Value Ref Range Status   SARS Coronavirus 2 NEGATIVE NEGATIVE Final    Comment: (NOTE) If result is NEGATIVE SARS-CoV-2 target nucleic acids are NOT DETECTED. The SARS-CoV-2 RNA is generally detectable in upper and lower  respiratory specimens during the acute phase of infection. The lowest  concentration of SARS-CoV-2 viral copies this assay can detect is 250  copies / mL. A negative result does not preclude SARS-CoV-2 infection  and should not be used as the sole basis for treatment or other  patient management  decisions.  A negative result may occur with  improper specimen collection / handling, submission of specimen other  than nasopharyngeal swab, presence of viral mutation(s) within the  areas targeted by this assay, and inadequate number of viral copies  (<250 copies / mL). A negative result must be combined with clinical  observations, patient history, and epidemiological information. If result is POSITIVE SARS-CoV-2 target nucleic acids are DETECTED. The SARS-CoV-2 RNA is generally detectable in upper and lower  respiratory specimens dur ing the acute phase of infection.  Positive  results are indicative of active infection with SARS-CoV-2.  Clinical  correlation with patient history and other diagnostic information is  necessary to determine patient infection status.  Positive results do  not rule out bacterial infection or co-infection with other viruses. If result is PRESUMPTIVE POSTIVE SARS-CoV-2 nucleic acids MAY BE PRESENT.   A presumptive positive result was obtained on the submitted specimen  and confirmed on repeat testing.  While 2019 novel coronavirus  (SARS-CoV-2) nucleic acids may be present in the submitted sample  additional confirmatory testing may be necessary for epidemiological  and / or clinical management purposes  to differentiate between  SARS-CoV-2 and other Sarbecovirus currently known to infect humans.  If clinically indicated additional testing with an alternate test  methodology [phone]) is advised. The SARS-CoV-2 RNA is generally  detectable in upper and lower respiratory sp ecimens during the acute  phase of infection. The expected result is Negative. Fact Sheet for Patients:  BoilerBrush.com.cy Fact Sheet for Healthcare Providers: https://pope.com/ This test is not yet approved or cleared by the [place] FDA and has been authorized for detection and/or diagnosis of SARS-CoV-2 by FDA under an  Emergency Use Authorization (EUA).  This EUA will remain in effect (meaning this test can be used) for the duration of the COVID-19 declaration under Section 564(b)(1) of the Act, 21 U.S.C. section 360bbb-3(b)(1), unless the authorization is terminated or revoked sooner. Performed at [facility], [address], [place], [place] [address]   Culture, body fluid-bottle     Status: None   Collection Time: 06/14/18  4:54 PM  Result Value Ref Range Status   Specimen Description FLUID PERITONEAL  Final   Special Requests BOTTLES DRAWN AEROBIC AND ANAEROBIC  Final   Culture   Final    NO GROWTH 5 DAYS Performed at [facility], [address], [place], [place] [address]    Report Status 06/19/2018 FINAL  Final  Gram stain     Status: None   Collection Time: 06/14/18  4:54 PM  Result Value Ref Range Status   Specimen Description FLUID PERITONEAL  Final  Special Requests NONE  Final   Gram Stain   Final    WBC PRESENT, PREDOMINANTLY MONONUCLEAR NO ORGANISMS SEEN CYTOSPIN SMEAR Performed at [facility], [address] [place], [place] [address]    Report Status 06/15/2018 FINAL  Final  MRSA PCR Screening     Status: None   Collection Time: 06/15/18  6:36 PM  Result Value Ref Range Status   MRSA by PCR NEGATIVE NEGATIVE Final    Comment:        The GeneXpert MRSA Assay (FDA approved for NASAL specimens only), is one component of a comprehensive MRSA colonization surveillance program. It is not intended to diagnose MRSA infection nor to guide or monitor treatment for MRSA infections. Performed at [facility], [address] [place], [place] [address]      Labs: BNP (last 3 results) Recent Labs    04/19/18 0813 04/24/18 0439 05/30/18 0903  BNP 231.2* 461.5* 60.2   Basic Metabolic Panel: Recent Labs  Lab 06/15/18 0333 06/16/18 0332 06/17/18 0158 06/18/18 0427 06/19/18 0345  NA 130* 132* 133* 134* 134*  K 4.2  4.0 4.1 3.9 4.1  CL 96* 99 97* 99 100  CO2 27 26 30 27 27   GLUCOSE 99 114* 104* 97 97  BUN 6 5* <5* 6 9  CREATININE 0.51 0.45 0.43* 0.55 0.69  CALCIUM 7.7* 7.7* 8.0* 7.8* 7.8*  MG 1.6* 1.6* 2.0  --   --    Liver Function Tests: Recent Labs  Lab 06/13/18 2002  AST 62*  ALT 27  ALKPHOS 149*  BILITOT 3.2*  PROT 6.5  ALBUMIN 2.7*   Recent Labs  Lab 06/13/18 2002  LIPASE 28   Recent Labs  Lab 06/13/18 2003  AMMONIA 28   CBC: Recent Labs  Lab 06/13/18 2002 06/14/18 0332 06/15/18 0333 06/19/18 0345  WBC 3.7* 3.4* 3.8* 3.6*  NEUTROABS 2.6  --  2.4 2.4  HGB 9.2* 8.2* 8.7* 8.0*  HCT 32.1* 29.2* 30.3* 27.8*  MCV 81.1 81.6 80.4 82.7  PLT 118* 103* 97* 101*   Cardiac Enzymes: Recent Labs  Lab 06/13/18 2002 06/14/18 0332  CKTOTAL 595* 623*   BNP: Invalid input(s): POCBNP CBG: No results for input(s): GLUCAP in the last 168 hours. D-Dimer No results for input(s): DDIMER in the last 72 hours. Hgb A1c No results for input(s): HGBA1C in the last 72 hours. Lipid Profile No results for input(s): CHOL, HDL, LDLCALC, TRIG, CHOLHDL, LDLDIRECT in the last 72 hours. Thyroid function studies No results for input(s): TSH, T4TOTAL, T3FREE, THYROIDAB in the last 72 hours.  Invalid input(s): FREET3 Anemia work up No results for input(s): VITAMINB12, FOLATE, FERRITIN, TIBC, IRON, RETICCTPCT in the last 72 hours. Urinalysis    Component Value Date/Time   COLORURINE YELLOW 06/13/2018 2002   APPEARANCEUR CLEAR 06/13/2018 2002   LABSPEC 1.006 06/13/2018 2002   PHURINE 6.0 06/13/2018 2002   GLUCOSEU NEGATIVE 06/13/2018 2002   HGBUR NEGATIVE 06/13/2018 2002   BILIRUBINUR NEGATIVE 06/13/2018 2002   BILIRUBINUR moderate (A) 06/19/2015 1906   KETONESUR NEGATIVE 06/13/2018 2002   PROTEINUR NEGATIVE 06/13/2018 2002   UROBILINOGEN >=8.0 06/19/2015 1906   UROBILINOGEN 0.2 09/14/2011 2141   NITRITE NEGATIVE 06/13/2018 2002   LEUKOCYTESUR NEGATIVE 06/13/2018 2002   Sepsis  Labs Invalid input(s): PROCALCITONIN,  WBC,  LACTICIDVEN Microbiology Recent Results (from the past 240 hour(s))  SARS Coronavirus 2 [facility] order, Performed in [facility] hospital lab)     Status: None   Collection  Time: 06/13/18 11:30 AM  Result Value Ref Range Status   SARS Coronavirus 2 NEGATIVE NEGATIVE Final    Comment: (NOTE) If result is NEGATIVE SARS-CoV-2 target nucleic acids are NOT DETECTED. The SARS-CoV-2 RNA is generally detectable in upper and lower  respiratory specimens during the acute phase of infection. The lowest  concentration of SARS-CoV-2 viral copies this assay can detect is 250  copies / mL. A negative result does not preclude SARS-CoV-2 infection  and should not be used as the sole basis for treatment or other  patient management decisions.  A negative result may occur with  improper specimen collection / handling, submission of specimen other  than nasopharyngeal swab, presence of viral mutation(s) within the  areas targeted by this assay, and inadequate number of viral copies  (<250 copies / mL). A negative result must be combined with clinical  observations, patient history, and epidemiological information. If result is POSITIVE SARS-CoV-2 target nucleic acids are DETECTED. The SARS-CoV-2 RNA is generally detectable in upper and lower  respiratory specimens dur ing the acute phase of infection.  Positive  results are indicative of active infection with SARS-CoV-2.  Clinical  correlation with patient history and other diagnostic information is  necessary to determine patient infection status.  Positive results do  not rule out bacterial infection or co-infection with other viruses. If result is PRESUMPTIVE POSTIVE SARS-CoV-2 nucleic acids MAY BE PRESENT.   A presumptive positive result was obtained on the submitted specimen  and confirmed on repeat testing.  While 2019 novel coronavirus  (SARS-CoV-2) nucleic acids may be present in the submitted  sample  additional confirmatory testing may be necessary for epidemiological  and / or clinical management purposes  to differentiate between  SARS-CoV-2 and other Sarbecovirus currently known to infect humans.  If clinically indicated additional testing with an alternate test  methodology [phone]) is advised. The SARS-CoV-2 RNA is generally  detectable in upper and lower respiratory sp ecimens during the acute  phase of infection. The expected result is Negative. Fact Sheet for Patients:  BoilerBrush.com.cy Fact Sheet for Healthcare Providers: https://pope.com/ This test is not yet approved or cleared by the [place] FDA and has been authorized for detection and/or diagnosis of SARS-CoV-2 by FDA under an Emergency Use Authorization (EUA).  This EUA will remain in effect (meaning this test can be used) for the duration of the COVID-19 declaration under Section 564(b)(1) of the Act, 21 U.S.C. section 360bbb-3(b)(1), unless the authorization is terminated or revoked sooner. Performed at [facility], [address], [place], [place] [address]   Culture, body fluid-bottle     Status: None   Collection Time: 06/14/18  4:54 PM  Result Value Ref Range Status   Specimen Description FLUID PERITONEAL  Final   Special Requests BOTTLES DRAWN AEROBIC AND ANAEROBIC  Final   Culture   Final    NO GROWTH 5 DAYS Performed at [facility], [address], [place], [place] [address]    Report Status 06/19/2018 FINAL  Final  Gram stain     Status: None   Collection Time: 06/14/18  4:54 PM  Result Value Ref Range Status   Specimen Description FLUID PERITONEAL  Final   Special Requests NONE  Final   Gram Stain   Final    WBC PRESENT, PREDOMINANTLY MONONUCLEAR NO ORGANISMS SEEN CYTOSPIN SMEAR Performed at [facility], [address], [place], [place] [address]    Report Status 06/15/2018 FINAL  Final  MRSA PCR  Screening     Status: None   Collection Time: 06/15/18  6:36 PM  Result Value Ref Range Status   MRSA by PCR NEGATIVE NEGATIVE Final    Comment:        The GeneXpert MRSA Assay (FDA approved for NASAL specimens only), is one component of a comprehensive MRSA colonization surveillance program. It is not intended to diagnose MRSA infection nor to guide or monitor treatment for MRSA infections. Performed at [facility], [address] [place], [place] [id number]      Time coordinating discharge: 45 minutes  SIGNED:   Coralie KeensMauricio Daniel Jaira Canady, MD  [facility] 06/20/2018, 10:11 AM  '

## 2018-06-20 NOTE — TOC Progression Note (Addendum)
Pt again agreeable to SNF. Arranging [facility] interview for PASRR. Pt will not be able to admit to SNF until PASRR approved.  Transition of Care [facility]) - Progression Note    Patient Details  Name: Anne Black MRN: [id number] Date of Birth: 09/05/1960  Transition of [facility]) CM/SW Contact  Nelwyn [place], [place] Phone Number: [phone] 06/20/2018, 11:01 AM  Clinical Narrative:   Received call from [facility] attempting to set up time to interview pt via phone for PASRR. CSW discussed with pt and pt reports she no longer agrees to pursue SNF and "is hiring a caregiver at home." Pt requesting home health therapy. CSW will begin arranging.    Expected Discharge Plan: Home with Home Health    Expected Discharge Plan and Services Expected Discharge Plan:Home with Home Health - arranging                                                Social Determinants of Health (SDOH) Interventions    Readmission Risk Interventions No flowsheet data found.

## 2018-06-20 NOTE — Progress Notes (Signed)
Patient unable to sit up unassisted on the side of the bed with PT/OT. Patient also unable to pull herself up in the bed. Therapist and RN at bedside and educated to patient that if she is not able to move on her own, then it is not safe to discharge to home at this time. Patient agreeable to SNF due to weakness. MD and CSW made aware of need for SNF placement. Will update patient's daughter as well.

## 2018-06-20 NOTE — Progress Notes (Signed)
Occupational Therapy Treatment Patient Details Name: Anne ApplebaumCarla S Black MRN: [id number] DOB: 07-07-1960 Today's Date: 06/20/2018    History of present illness 58 yo female admitted with Afib/SVT, fall. Hx of MS, A fib, bipolar d/o, ETOH cirrhosis, ADHD, ascites, arthritis, COPD   OT comments  Pt is now agreeable to SNF.  Pt NOT able to go home as she needs significant A  Follow Up Recommendations  SNF    Equipment Recommendations  None recommended by OT    Recommendations for Other Services      Precautions / Restrictions Precautions Precautions: Fall       Mobility Bed Mobility   Bed Mobility: Supine to Sit;Sit to Supine Rolling: Mod assist   Supine to sit: Mod assist;Max assist;+2 for physical assistance;+2 for safety/equipment;HOB elevated Sit to supine: Max assist;+2 for physical assistance;+2 for safety/equipment      Transfers                 General transfer comment: did not stand    Balance Overall balance assessment: Needs assistance Sitting-balance support: Bilateral upper extremity supported;Feet supported Sitting balance-Leahy Scale: Poor                                     ADL either performed or assessed with clinical judgement   ADL Overall ADL's : Needs assistance/impaired Eating/Feeding: Set up;Sitting   Grooming: Sitting;Moderate assistance                                 General ADL Comments: pt not able to sit EOB but briefly.  Pt realizes she needs SNF as she is not able to go home alone.  RN aware     Vision Patient Visual Report: No change from baseline            Cognition Arousal/Alertness: Awake/alert Behavior During Therapy: WFL for tasks assessed/performed Overall Cognitive Status: No family/caregiver present to determine baseline cognitive functioning                                 General Comments: decreased awareness of amount of [name] will need at home                    Pertinent Vitals/ Pain       Pain Score: 8  Pain Location: back Pain Descriptors / Indicators: Discomfort;Moaning;Sore Pain Intervention(s): Limited activity within patient's tolerance;Premedicated before session;Monitored during session;Repositioned;Relaxation     Prior Functioning/Environment              Frequency  Min 2X/week        Progress Toward Goals  OT Goals(current goals can now be found in the care plan section)  Progress towards OT goals: OT to reassess next treatment     Plan Discharge plan needs to be updated       AM-PAC OT "6 Clicks" Daily Activity     Outcome Measure   Help from another person eating meals?: A Little Help from another person taking care of personal grooming?: A Little Help from another person toileting, which includes using toliet, bedpan, or urinal?: Total Help from another person bathing (including washing, rinsing, drying)?: A Lot Help from another person to put on and taking off regular upper body clothing?: A Lot  Help from another person to put on and taking off regular lower body clothing?: Total 6 Click Score: 12    End of Session    OT Visit Diagnosis: Unsteadiness on feet (R26.81);Other abnormalities of gait and mobility (R26.89);Muscle weakness (generalized) (M62.81)   Activity Tolerance Patient limited by fatigue;Patient limited by pain   Patient Left in bed;with call bell/phone within reach;with chair alarm set;with nursing/sitter in room   Nurse Communication Mobility status        Time: 9678-9381 OT Time Calculation (min): 14 min  Charges: OT General Charges $OT Visit: 1 Visit OT Treatments $Self Care/Home Management : 8-22 mins  Anne Black, OT Acute Rehabilitation Services [phone] Office- [phone]      Deryk Bozman, Karin Golden D 06/20/2018, 1:34 PM

## 2018-06-21 LAB — SARS CORONAVIRUS 2 BY RT PCR (HOSPITAL ORDER, PERFORMED IN ~~LOC~~ HOSPITAL LAB): SARS Coronavirus 2: NEGATIVE

## 2018-06-21 IMAGING — US US PARACENTESIS
1 series · 6 of 6 positions shown · non-contrast
Comparison: none

INDICATION: Cirrhosis with ascites, request for diagnostic and therapeutic
paracentesis

[Series 1: us paracentesis · 0.26mm/px · 6 of 6 slices shown]
[im 1/6]
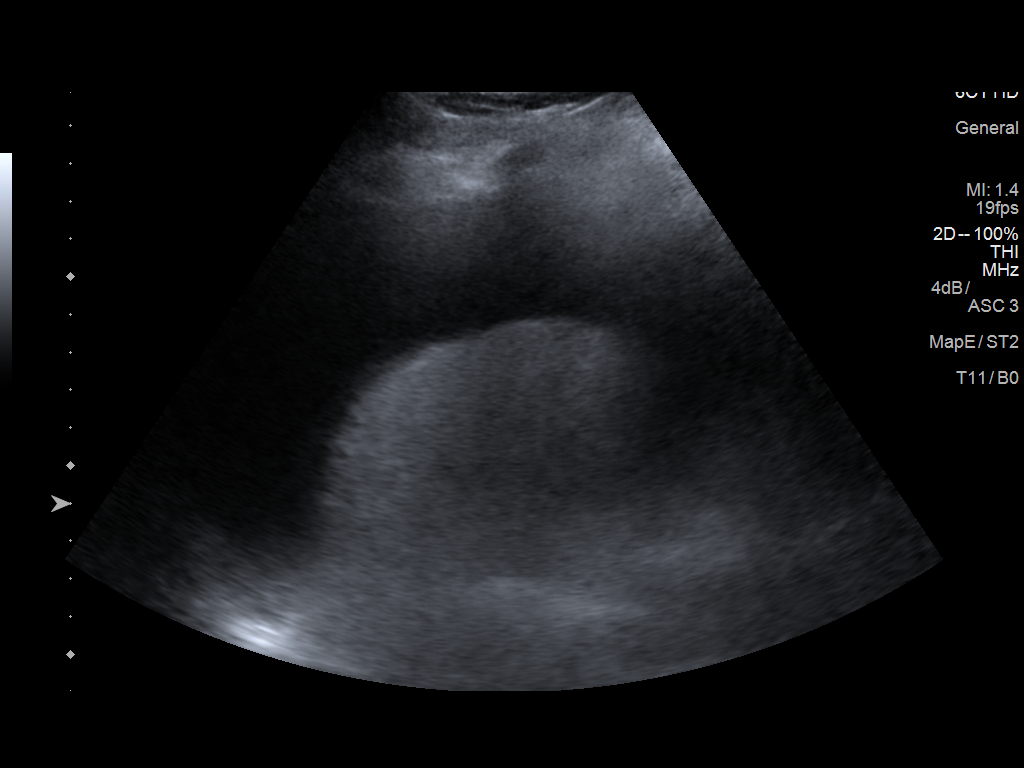
[im 2/6]
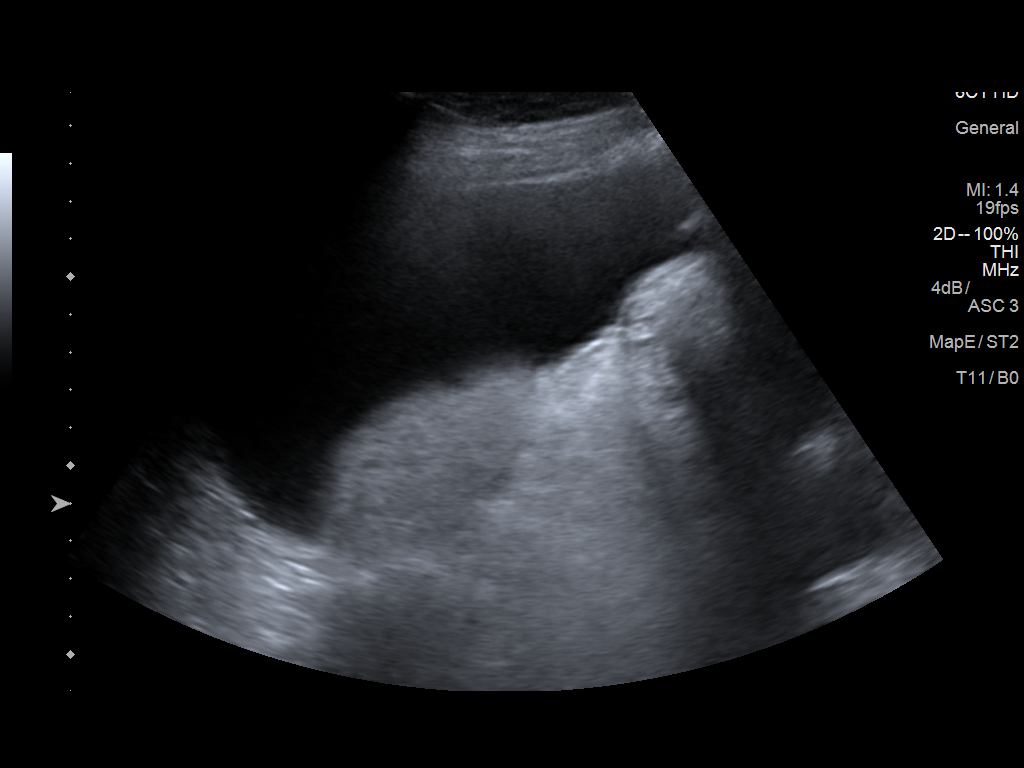
[im 3/6]
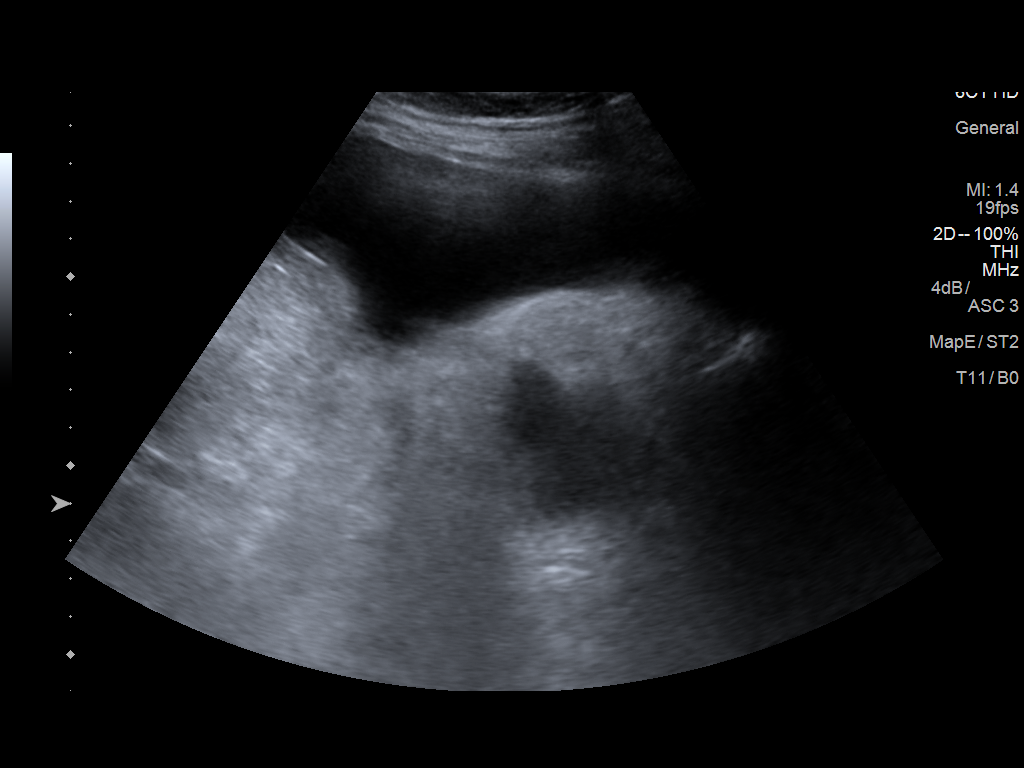
[im 4/6]
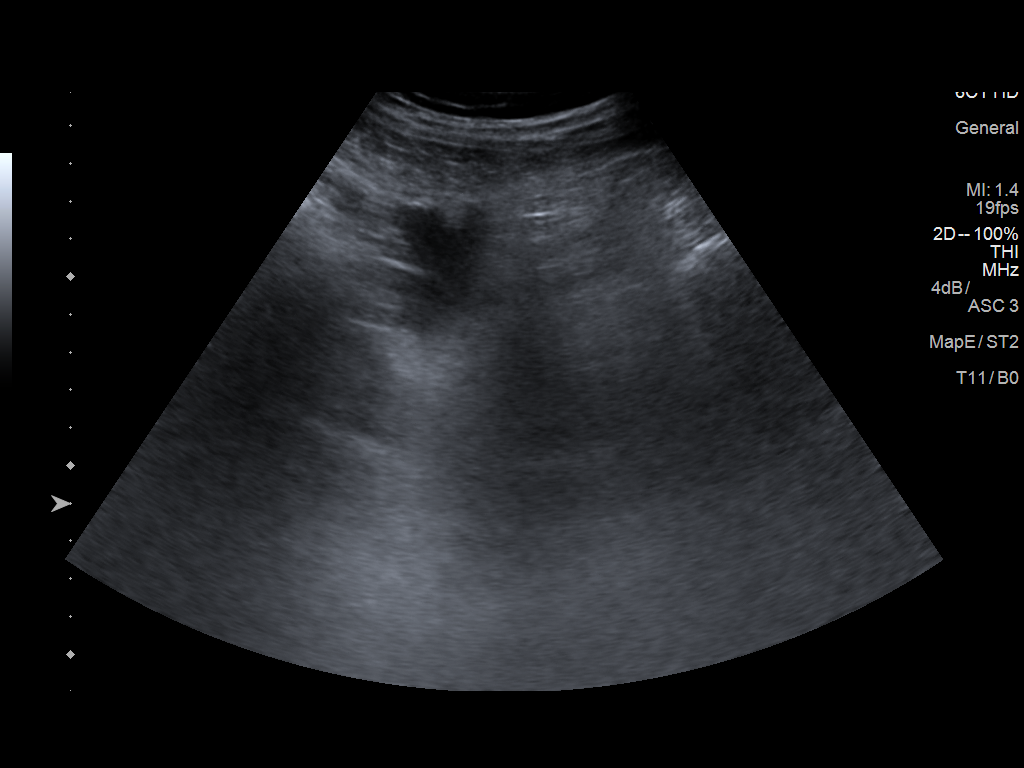
[im 5/6]
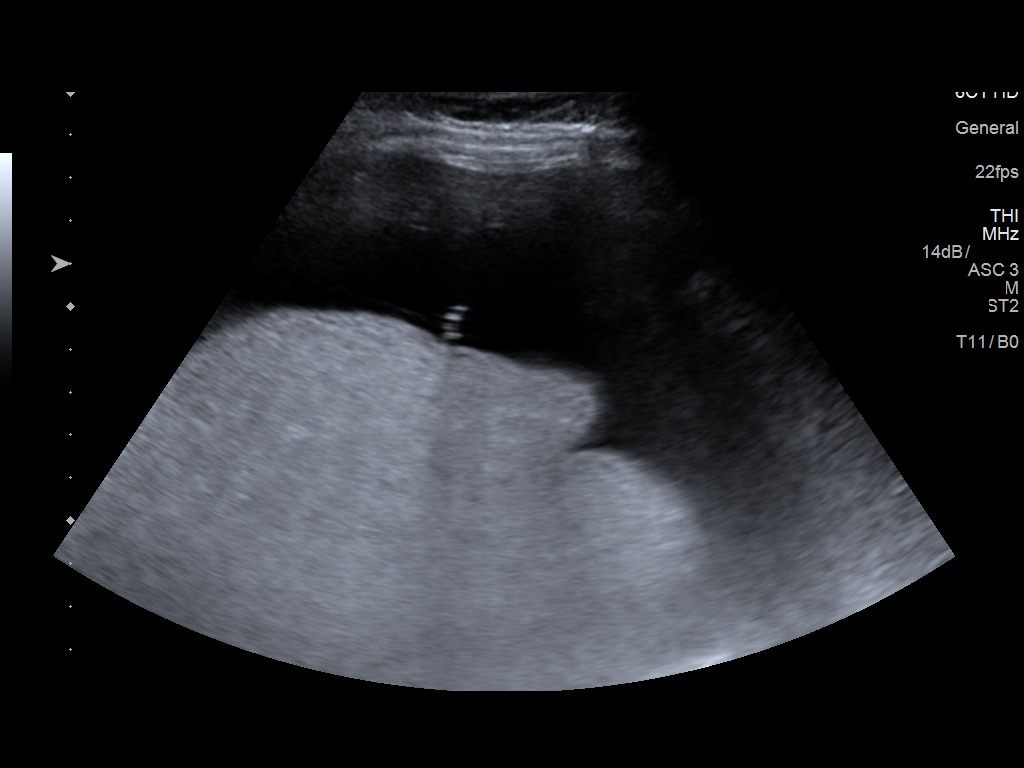
[im 6/6]
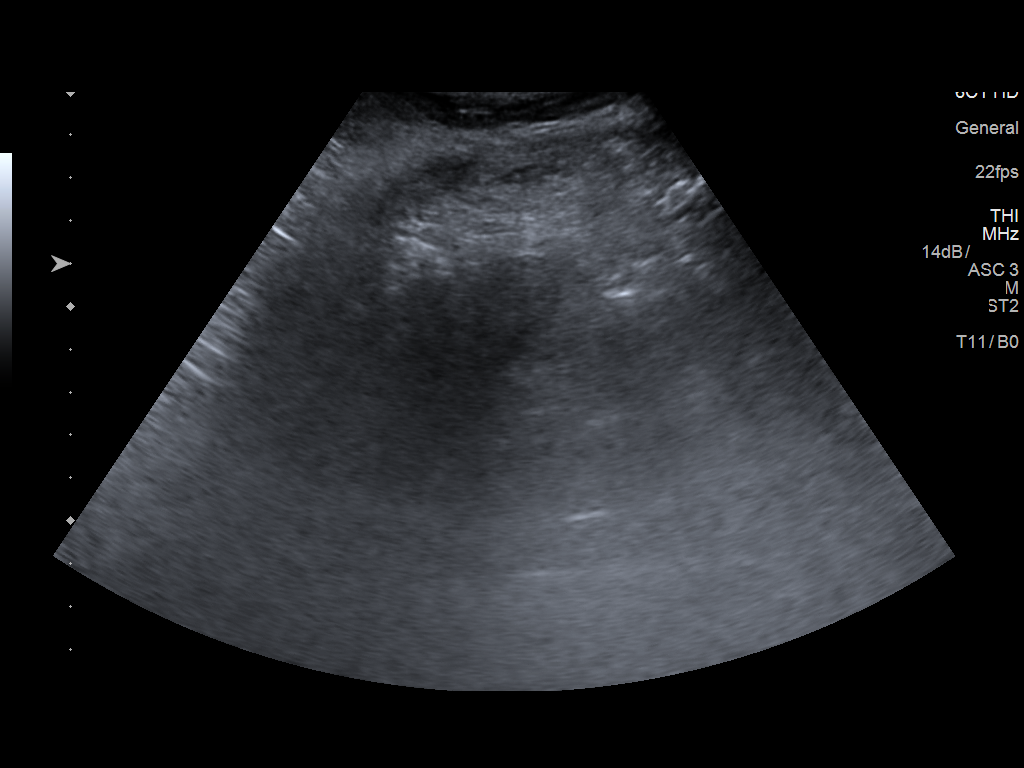

[6 of 6 positions shown; findings below may reference images not displayed]

EXAM:
ULTRASOUND GUIDED DIAGNOSTIC AND THERAPEUTIC PARACENTESIS

MEDICATIONS:
1% Lidocaine

COMPLICATIONS:
None immediate.

PROCEDURE:
Informed written consent was obtained from the patient after a
discussion of the risks, benefits and alternatives to treatment. A
timeout was performed prior to the initiation of the procedure.

Initial ultrasound scanning demonstrates a moderate amount of
ascites within the right upper abdominal quadrant. The right upper
abdomen was prepped and draped in the usual sterile fashion. 1%
lidocaine was used for local anesthesia.

Following this, a 19 gauge, 7-cm, Yueh catheter was introduced. An
ultrasound image was saved for documentation purposes. The
paracentesis was performed. The catheter was removed and a dressing
was applied. The patient tolerated the procedure well without
immediate post procedural complication.
FINDINGS: A total of approximately 3.9L of clear, yellow fluid was removed.
Samples were sent to the laboratory as requested by the clinical
team.
IMPRESSION: Successful ultrasound-guided paracentesis yielding 3.9 liters of
peritoneal fluid.

## 2018-06-21 MED ORDER — ONDANSETRON HCL 4 MG/2ML IJ SOLN
4.0000 mg | Freq: Four times a day (QID) | INTRAMUSCULAR | Status: DC | PRN
  Administered 2018-06-21: 4 mg via INTRAVENOUS
  Filled 2018-06-21: qty 2

## 2018-06-21 NOTE — Discharge Summary (Signed)
Patient seen and examined.  Patient was in fact discharged yesterday however after discharge orders and summary was placed, I am being told that patient had changed her mind and refused to go to a skilled nursing facility.  She was kept overnight.  She is now agreeable to go to skilled nursing facility and according to the case manager, place has been arranged for her.  She has no complaints and she remains hemodynamically stable and there has been no change compared to yesterday in her medical and clinical status so she will be discharged.  No changes in medication.  Please refer to the discharge summary from yesterday.

## 2018-06-21 NOTE — Progress Notes (Signed)
PT negative for COVID. Report given to [facility] SNF rep. Report can be called to [phone] room 127A.

## 2018-06-21 NOTE — Progress Notes (Signed)
Spoke with pt's daughter Gilbert Bittenbender [phone] concerning discharge to SNF. Roanna Epley selected [facility]. [facility] referral was [facility]. [facility] request COVID test before discharge to SNF.

## 2018-06-21 NOTE — Progress Notes (Signed)
Report called to [facility]. Awaiting transportation.

## 2018-06-21 NOTE — Progress Notes (Signed)
[facility] called to schedule transportation at 3 PM.

## 2018-06-21 NOTE — Progress Notes (Signed)
Patient transferred to facility via [facility]. All belongings w/ patient. Dtr notified of transfer.

## 2018-06-25 ENCOUNTER — Emergency Department (HOSPITAL_COMMUNITY)
Admission: EM | Admit: 2018-06-25 | Discharge: 2018-06-25 | Disposition: A | Discharge status: Other Acute Inpatient Hospital | Payer: Self-pay | Attending: Emergency Medicine | Admitting: Emergency Medicine

## 2018-06-25 ENCOUNTER — Other Ambulatory Visit: Payer: Self-pay

## 2018-06-25 ENCOUNTER — Encounter (HOSPITAL_COMMUNITY): Payer: Self-pay

## 2018-06-25 DIAGNOSIS — Z79899 Other long term (current) drug therapy: Secondary | ICD-10-CM | POA: Insufficient documentation

## 2018-06-25 DIAGNOSIS — Z1159 Encounter for screening for other viral diseases: Secondary | ICD-10-CM | POA: Insufficient documentation

## 2018-06-25 DIAGNOSIS — K7031 Alcoholic cirrhosis of liver with ascites: Secondary | ICD-10-CM | POA: Insufficient documentation

## 2018-06-25 DIAGNOSIS — J449 Chronic obstructive pulmonary disease, unspecified: Secondary | ICD-10-CM | POA: Insufficient documentation

## 2018-06-25 DIAGNOSIS — F17210 Nicotine dependence, cigarettes, uncomplicated: Secondary | ICD-10-CM | POA: Insufficient documentation

## 2018-06-25 DIAGNOSIS — R609 Edema, unspecified: Secondary | ICD-10-CM

## 2018-06-25 DIAGNOSIS — R600 Localized edema: Secondary | ICD-10-CM | POA: Insufficient documentation

## 2018-06-25 LAB — SARS CORONAVIRUS 2 BY RT PCR (HOSPITAL ORDER, PERFORMED IN ~~LOC~~ HOSPITAL LAB): SARS Coronavirus 2: NEGATIVE

## 2018-06-25 MED ORDER — OXYCODONE HCL 5 MG PO TABS
5.0000 mg | ORAL_TABLET | Freq: Once | ORAL | Status: AC
  Administered 2018-06-25: 5 mg via ORAL
  Filled 2018-06-25: qty 1

## 2018-06-25 NOTE — ED Notes (Signed)
Bed: TX77 Expected date:  Expected time:  Means of arrival:  Comments: EMS - Abd pain

## 2018-06-25 NOTE — Discharge Instructions (Addendum)
Call ASAP to the IR department at [facility] to schedule a therapeutic paracentesis for management of your ascites. Continue your daily prescribed medications. Follow up with your primary care doctor to ensure improvement in your symptoms. You may return for new or concerning symptoms.

## 2018-06-25 NOTE — ED Provider Notes (Addendum)
[facility]-EMERGENCY DEPT Provider Note   CSN: [id number] Arrival date & time: 06/25/18  0342    History   Chief Complaint Chief Complaint  Patient presents with  . Leg Swelling    HPI Anne Black is a 58 y.o. female.     58 y.o. female with medical history significant for PAF not on anticoagulation, alcoholic liver cirrhosis, COPD on 2L chronic O2, MS, bipolar disorder, depression, and tobacco use presents to the ED from her SNF for complaints of diffuse body pain, worse in her abdomen and lower extremities. States that she has fluid in her "whole body" and this is making her body hurt. She has not taken medications for pain because she is not prescribed medications; has been compliant with her spironolactone and lasix, however. No associated fevers or trauma.     Past Medical History:  Diagnosis Date  . ADHD (attention deficit hyperactivity disorder)   . Alcoholic cirrhosis (HCC)    with ascites  . Alcoholism (HCC)   . Allergies   . Anxiety   . Arthritis   . Atrial fibrillation with RVR (HCC)   . Bipolar disorder (HCC)   . COPD (chronic obstructive pulmonary disease) (HCC)    wears 2L chronic O2  . Dyspnea   . EtOH dependence (HCC) 05/14/2018  . GERD (gastroesophageal reflux disease)   . History of hiatal hernia   . Hypokalemia   . Migraine   . Multiple sclerosis (HCC)   . Narcolepsy   . Osteoarthritis of knee   . Osteoporosis   . Pneumonia     Patient Active Problem List   Diagnosis Date Noted  . Atrial fibrillation with RVR (HCC) 06/13/2018  . Alcohol dependence (HCC) 05/06/2018  . Electrolyte disturbance 05/05/2018  . Alcohol withdrawal delirium (HCC) 04/28/2018  . Weakness generalized 04/27/2018  . Gait abnormality 04/24/2018  . Chronic hypoxemic respiratory failure (HCC) 04/11/2018  . Atrial fibrillation with rapid ventricular response (HCC) 03/23/2018  . Head injury   . Fall 03/19/2018  . Syncope 03/19/2018  . Orbital  fracture 03/19/2018  . Scalp laceration 03/19/2018  . Acute respiratory failure with hypoxia and hypercarbia (HCC) 03/19/2018  . Abdominal tenderness 03/19/2018  . Acute on chronic respiratory failure with hypoxia (HCC) 03/17/2018  . Alcohol abuse with intoxication (HCC) 03/17/2018  . ETOH abuse   . Normocytic anemia 03/16/2018  . Acute metabolic encephalopathy 03/16/2018  . Lower leg edema 03/16/2018  . Migraine 03/16/2018  . Community acquired pneumonia of left lower lobe of lung (HCC)   . Weakness 08/21/2017  . COPD exacerbation (HCC) 06/12/2017  . Chest pain with moderate risk for cardiac etiology 05/30/2017  . Abnormal LFTs 05/30/2017  . Tachycardia 05/30/2017  . COPD (chronic obstructive pulmonary disease) (HCC) 04/19/2017  . Right rib fracture 04/15/2017  . Abdominal wall cellulitis 12/30/2016  . S/P exploratory laparotomy 12/30/2016  . Distal radius fracture, left 09/09/2016  . Thrombocytopenia (HCC) 09/09/2016  . Chest x-ray abnormality   . Umbilical hernia without obstruction and without gangrene 05/12/2016  . Itching 02/24/2016  . Ventral hernia without obstruction or gangrene 02/24/2016  . Palliative care encounter   . Ascites   . Tachypnea   . Atrial flutter with rapid ventricular response (HCC)   . Hypomagnesemia   . Hypoxia   . Dehydration 01/22/2016  . Low back ache 12/30/2015  . Non-intractable vomiting with nausea   . Alcohol abuse with alcohol-induced mood disorder (HCC) 12/15/2015  . Acute alcoholism (HCC)   .  Abdominal distension 11/04/2015  . Alcohol abuse   . Dyspnea   . Acute respiratory failure (HCC) 10/15/2015  . Ascites due to alcoholic cirrhosis (HCC) 10/15/2015  . Osteoporosis   . Multiple sclerosis (HCC)   . Alcoholic cirrhosis of liver with ascites (HCC) 08/22/2015  . Bipolar disorder, current episode mixed, moderate (HCC) 08/01/2015  . Alcohol use disorder, severe, dependence (HCC) 07/31/2015  . Macrocytic anemia- due to alcohol abuse  with normal B12 & folate levels 05/31/2015  . Severe protein-calorie malnutrition (HCC) 05/31/2015  . Hypokalemia 05/26/2015  . Encephalopathy, hepatic (HCC) 05/26/2015  . Alcohol withdrawal (HCC) 05/18/2015  . Abdominal pain 05/18/2015  . Anxiety 05/18/2015  . Stimulant abuse (HCC) 12/27/2014  . Nicotine abuse 12/27/2014  . Hyperprolactinemia (HCC) 11/15/2014  . Dyslipidemia   . Alcohol use disorder, moderate, dependence (HCC) 10/30/2014  . Tobacco abuse 01/23/2014  . Bipolar 1 disorder (HCC) 04/07/2013  . Noncompliance with therapeutic plan 04/04/2013  . Hereditary and idiopathic peripheral neuropathy 05/01/2012  . Depression 05/01/2012  . GERD (gastroesophageal reflux disease) 05/01/2012  . Osteoarthrosis, unspecified whether generalized or localized, involving lower leg 05/01/2012  . Hyponatremia 07/01/2011    Past Surgical History:  Procedure Laterality Date  . ABDOMINAL WALL DEFECT REPAIR N/A 12/30/2016   Procedure: EXPLORATORY LAPAROTOMY WITH REPAIR ABDOMINAL WALL VENTRAL HERNIA;  Surgeon: [name], MD;  Location: [facility] OR;  Service: General;  Laterality: N/A;  . APPLICATION OF WOUND VAC N/A 12/30/2016   Procedure: APPLICATION OF WOUND VAC;  Surgeon: [name], MD;  Location: [facility] OR;  Service: General;  Laterality: N/A;  . CESAREAN SECTION  [phone]  . FRACTURE SURGERY    . HERNIA REPAIR    . IR PARACENTESIS  06/14/2018  . MYRINGOTOMY WITH TUBE PLACEMENT Bilateral   . ORIF WRIST FRACTURE Left 09/09/2016   Procedure: OPEN REDUCTION INTERNAL FIXATION (ORIF) LEFT WRIST FRACTURE, LEFT CARPAL TUNNEL RELEASE;  Surgeon: Dominica Severin, MD;  Location: [facility] ORS;  Service: Orthopedics;  Laterality: Left;  . TONSILLECTOMY       OB History   No obstetric history on file.      Home Medications    Prior to Admission medications   Medication Sig Start Date End Date Taking? Authorizing Provider  acetaminophen (TYLENOL) 500 MG tablet Take 1 tablet (500 mg total) by  mouth every 6 (six) hours as needed for mild pain. Patient taking differently: Take 1,000 mg by mouth every 6 (six) hours as needed for mild pain.  03/28/18   Rolly Salter, MD  diltiazem (CARDIZEM CD) 120 MG 24 hr capsule Take 1 capsule (120 mg total) by mouth daily for 30 days. 04/14/18 05/30/18  Tegeler, Canary Brim, MD  furosemide (LASIX) 40 MG tablet Take 40 mg twice daily till 04/02/2018, after that take 40 mg daily. Patient taking differently: Take 40 mg by mouth daily. Take 40 mg twice daily till 04/02/2018, after that take 40 mg daily. 04/14/18   Tegeler, Canary Brim, MD  gabapentin (NEURONTIN) 100 MG capsule Take 2 capsules (200 mg total) by mouth 3 (three) times daily for 30 days. 05/19/18 06/18/18  Lorre Nick, MD  hydrOXYzine (ATARAX/VISTARIL) 25 MG tablet Take 1 tablet (25 mg total) by mouth daily as needed for up to 30 doses (anxiety). Patient taking differently: Take 25 mg by mouth daily as needed for anxiety (withdrawal).  04/14/18   Tegeler, Canary Brim, MD  magnesium oxide (MAG-OX) 400 MG tablet Take 400 mg by mouth daily.    [provider]  ondansetron (ZOFRAN) 4 MG tablet Take 4 mg by mouth every 8 (eight) hours as needed for nausea or vomiting.    [provider]  pantoprazole (PROTONIX) 40 MG tablet Take 1 tablet (40 mg total) by mouth daily for 30 days. 04/14/18 05/30/18  Tegeler, Canary Brim, MD  potassium chloride SA (K-DUR,KLOR-CON) 20 MEQ tablet Take 2 tablets (40 mEq total) by mouth 2 (two) times daily. 04/19/18   Bethel Born, PA-C  spironolactone (ALDACTONE) 25 MG tablet Take 1 tablet (25 mg total) by mouth daily for 30 days. 04/14/18 06/14/18  Tegeler, Canary Brim, MD    Family History Family History  Problem Relation Age of Onset  . Arrhythmia Mother   . Neuropathy Mother   . Coronary artery disease Mother   . Heart disease Mother   . Heart disease Father   . Hypertension Father   . Heart attack Father   . Multiple sclerosis Maternal  Aunt     Social History Social History   Tobacco Use  . Smoking status: Current Every Day Smoker    Packs/day: 1.00    Years: 36.00    Pack years: 36.00    Types: Cigarettes  . Smokeless tobacco: Never Used  Substance Use Topics  . Alcohol use: Yes    Comment: Chronic ETOH abuse  . Drug use: No     Allergies   Patient has no known allergies.   Review of Systems Review of Systems Ten systems reviewed and are negative for acute change, except as noted in the HPI.    Physical Exam Updated Vital Signs BP (!) 97/49   Pulse 83   Temp 98.2 F (36.8 C) (Oral)   Resp 18   Ht  (1.575 m)   Wt 90.7 kg   SpO2 99%   BMI 36.58 kg/m   Physical Exam Vitals signs and nursing note reviewed.  Constitutional:      General: She is not in acute distress.    Appearance: She is well-developed. She is not diaphoretic.     Comments: Chronically ill appearing, nontoxic.  HENT:     Head: Normocephalic and atraumatic.  Eyes:     General: No scleral icterus.    Conjunctiva/sclera: Conjunctivae normal.  Neck:     Musculoskeletal: Normal range of motion.  Cardiovascular:     Rate and Rhythm: Normal rate and regular rhythm.     Pulses: Normal pulses.  Pulmonary:     Effort: Pulmonary effort is normal. No respiratory distress.     Comments: SpO2 100 on chronic 2L via [place]. Respirations even and unlabored Abdominal:     Comments: Abdomen distended and obese, ascites present. No focal TTP. No peritoneal signs.  Musculoskeletal: Normal range of motion.     Right lower leg: Edema present.     Left lower leg: Edema present.     Comments: 2+ edema, BLE  Skin:    General: Skin is warm and dry.     Coloration: Skin is not pale.     Findings: No erythema or rash.  Neurological:     General: No focal deficit present.     Mental Status: She is alert and oriented to person, place, and time.     Coordination: Coordination normal.     Comments: GCS 15. Patient moving all extremities.   Psychiatric:        Behavior: Behavior normal.      ED Treatments / Results  Labs (all labs ordered are listed, but  only abnormal results are displayed) Labs Reviewed  SARS CORONAVIRUS 2 (HOSPITAL ORDER, PERFORMED IN [facility] LAB)    EKG None  Radiology No results found.  EMERGENCY DEPARTMENT [place] ACITES EXAM "Study: Limited Abdominal Ultrasound for Evaluation of Free Fluid"  INDICATIONS: Abd pain and Distention  PERFORMED BY: Myself IMAGES ARCHIVED?: Yes VIEWS USES: Right lower quad INTERPRETATION: Free fluid present   Procedures Procedures (including critical care time)  Medications Ordered in ED Medications  oxyCODONE (Oxy IR/ROXICODONE) immediate release tablet 5 mg (5 mg Oral Given 06/25/18 0452)    4:49 AM Bedside US does indicate large volume ascites. Images of the RLQ saved.  5:05 AM Attempted to call [facility] SNF to see if patient has standing order for future paracentesis. No answer x 3.    Initial Impression / Assessment and Plan / ED Course  I have reviewed the triage vital signs and the nursing notes.  Pertinent labs & imaging results that were available during my care of the patient were reviewed by me and considered in my medical decision making (see chart for details).        58 year old female presents to the emergency department for complaints of body pain.  She attributes most of this pain to her progressive abdominal ascites as well as lower extremity edema.  She was discharged from the hospital 3 days ago following a weeklong admission.  Hx of paracentesis of 6.1L on 06/14/18.  She has been compliant at her SNF with her spironolactone and Lasix.  Denies taking any medications for pain.  She was given a tablet of oxycodone in the emergency department.  The patient does have a large, distended abdomen with ascites.  She has no increased work of breathing or hypoxia on her chronic 2 L via nasal cannula.  No tachypnea or  dyspnea.  No associated fevers.    I do not believe the patient needs to undergo emergent therapeutic paracentesis, but will attempt to coordinate outpatient paracentesis for the patient in the next 1 to 2 days.  An outpatient order was placed.  Will give phone number for IR department at [facility] to schedule this procedure.  She has been encouraged to continue her daily medications and to follow-up with her primary care doctor.  Return precautions provided. Patient discharged in stable condition with no unaddressed concerns.   Final Clinical Impressions(s) / ED Diagnoses   Final diagnoses:  Ascites due to alcoholic cirrhosis [facility])  Peripheral edema    ED Discharge Orders         Ordered    IR Paracentesis     06/25/18 0504           Antony Madura, PA-C 06/25/18 0547    Antony Madura, PA-C 06/25/18 0548    Mesner, Barbara Cower, MD 06/25/18 [phone]

## 2018-06-25 NOTE — ED Triage Notes (Signed)
Per EMS, pt wanted to come to hospital for "pain all over".Denies trauma. Hx of BLE pitting edema and ascites.

## 2018-06-25 NOTE — ED Notes (Signed)
[facility] notified for pt transportation back to [facility]

## 2018-06-26 ENCOUNTER — Emergency Department (HOSPITAL_COMMUNITY)
Admission: EM | Admit: 2018-06-26 | Discharge: 2018-06-26 | Disposition: A | Discharge status: Home / Self Care | Payer: Self-pay | Attending: Emergency Medicine | Admitting: Emergency Medicine

## 2018-06-26 ENCOUNTER — Encounter (HOSPITAL_COMMUNITY): Payer: Self-pay | Admitting: *Deleted

## 2018-06-26 DIAGNOSIS — R609 Edema, unspecified: Secondary | ICD-10-CM | POA: Insufficient documentation

## 2018-06-26 DIAGNOSIS — Z5321 Procedure and treatment not carried out due to patient leaving prior to being seen by health care provider: Secondary | ICD-10-CM | POA: Insufficient documentation

## 2018-06-26 NOTE — ED Triage Notes (Signed)
Pt in via EMS to triage from a nursing facility, pt called herself for EMS, pt has been c/o bilateral leg swelling and pain, seen recently for same at [facility], pt is upset with pain control provided at nursing facility, pt was given oxycodone x2 earlier which is not helping her pain

## 2018-06-26 NOTE — ED Notes (Signed)
Pt  LAMA, RN inquired of Pt's reason to leave. Pt's stated she doesn't want to cont to wait here, she wants to go back to her rehab facility. RN educated on the risks if she LWBS. Pt stated understanding and had family member to take her home

## 2018-06-27 ENCOUNTER — Emergency Department (HOSPITAL_COMMUNITY)
Admission: EM | Admit: 2018-06-27 | Discharge: 2018-06-27 | Disposition: A | Discharge status: Home / Self Care | Payer: Self-pay | Attending: Emergency Medicine | Admitting: Emergency Medicine

## 2018-06-27 ENCOUNTER — Encounter (HOSPITAL_COMMUNITY): Payer: Self-pay | Admitting: Emergency Medicine

## 2018-06-27 DIAGNOSIS — G35 Multiple sclerosis: Secondary | ICD-10-CM | POA: Insufficient documentation

## 2018-06-27 DIAGNOSIS — M791 Myalgia, unspecified site: Secondary | ICD-10-CM | POA: Insufficient documentation

## 2018-06-27 DIAGNOSIS — I4891 Unspecified atrial fibrillation: Secondary | ICD-10-CM | POA: Insufficient documentation

## 2018-06-27 DIAGNOSIS — J449 Chronic obstructive pulmonary disease, unspecified: Secondary | ICD-10-CM | POA: Insufficient documentation

## 2018-06-27 DIAGNOSIS — R609 Edema, unspecified: Secondary | ICD-10-CM | POA: Insufficient documentation

## 2018-06-27 DIAGNOSIS — Z9981 Dependence on supplemental oxygen: Secondary | ICD-10-CM | POA: Insufficient documentation

## 2018-06-27 DIAGNOSIS — F17210 Nicotine dependence, cigarettes, uncomplicated: Secondary | ICD-10-CM | POA: Insufficient documentation

## 2018-06-27 MED ORDER — FUROSEMIDE 40 MG PO TABS: 40.0000 mg | ORAL_TABLET | Freq: Every day | ORAL | 1 refills | Status: DC

## 2018-06-27 MED ORDER — CYCLOBENZAPRINE HCL 10 MG PO TABS: 10.0000 mg | ORAL_TABLET | Freq: Two times a day (BID) | ORAL | 0 refills | Status: DC | PRN

## 2018-06-27 MED ORDER — METHOCARBAMOL 500 MG PO TABS: 500.0000 mg | ORAL_TABLET | Freq: Two times a day (BID) | ORAL | 0 refills | Status: DC | PRN

## 2018-06-27 MED ORDER — SPIRONOLACTONE 25 MG PO TABS: 25.0000 mg | ORAL_TABLET | Freq: Every day | ORAL | 1 refills | Status: AC

## 2018-06-27 MED ORDER — FUROSEMIDE 40 MG PO TABS: 40.0000 mg | ORAL_TABLET | Freq: Every day | ORAL | 1 refills | Status: AC

## 2018-06-27 NOTE — ED Provider Notes (Signed)
[facility] Emergency Department Provider Note MRN:  [id number]  Arrival date & time: 06/27/18     Chief Complaint   Pain   History of Present Illness   Anne Black is a 58 y.o. year-old female with a history of alcoholic cirrhosis presenting to the ED with chief complaint of pain.  Patient explains that she started a vigorous rehab routine a few days ago, feels that she overexerted herself, and after the past 2 days she has been experiencing soreness to her muscles of her legs and her lower back.  Worse with movement.  Denies bowel or bladder dysfunction.  Denies abdominal pain, no fever, no chest pain or shortness of breath, no other complaints.  Requesting oxycodone for the pain.  Review of Systems  A complete 10 system review of systems was obtained and all systems are negative except as noted in the HPI and PMH.   Patient's Health History    Past Medical History:  Diagnosis Date  . ADHD (attention deficit hyperactivity disorder)   . Alcoholic cirrhosis (HCC)    with ascites  . Alcoholism (HCC)   . Allergies   . Anxiety   . Arthritis   . Atrial fibrillation with RVR (HCC)   . Bipolar disorder (HCC)   . COPD (chronic obstructive pulmonary disease) (HCC)    wears 2L chronic O2  . Dyspnea   . EtOH dependence (HCC) 05/14/2018  . GERD (gastroesophageal reflux disease)   . History of hiatal hernia   . Hypokalemia   . Migraine   . Multiple sclerosis (HCC)   . Narcolepsy   . Osteoarthritis of knee   . Osteoporosis   . Pneumonia     Past Surgical History:  Procedure Laterality Date  . ABDOMINAL WALL DEFECT REPAIR N/A 12/30/2016   Procedure: EXPLORATORY LAPAROTOMY WITH REPAIR ABDOMINAL WALL VENTRAL HERNIA;  Surgeon: [name], MD;  Location: [facility] OR;  Service: General;  Laterality: N/A;  . APPLICATION OF WOUND VAC N/A 12/30/2016   Procedure: APPLICATION OF WOUND VAC;  Surgeon: [name], MD;  Location: [facility] OR;  Service: General;   Laterality: N/A;  . CESAREAN SECTION  [phone]  . FRACTURE SURGERY    . HERNIA REPAIR    . IR PARACENTESIS  06/14/2018  . MYRINGOTOMY WITH TUBE PLACEMENT Bilateral   . ORIF WRIST FRACTURE Left 09/09/2016   Procedure: OPEN REDUCTION INTERNAL FIXATION (ORIF) LEFT WRIST FRACTURE, LEFT CARPAL TUNNEL RELEASE;  Surgeon: Dominica Severin, MD;  Location: [facility] ORS;  Service: Orthopedics;  Laterality: Left;  . TONSILLECTOMY      Family History  Problem Relation Age of Onset  . Arrhythmia Mother   . Neuropathy Mother   . Coronary artery disease Mother   . Heart disease Mother   . Heart disease Father   . Hypertension Father   . Heart attack Father   . Multiple sclerosis Maternal Aunt     Social History   Socioeconomic History  . Marital status: Divorced    Spouse name: n/a  . Number of children: 5  . Years of education: College  . Highest education level: Not on file  Occupational History  . Occupation: disabled    Comment: on social security disability for MS  Social Needs  . Financial resource strain: Somewhat hard  . Food insecurity:    Worry: Never true    Inability: Never true  . Transportation needs:    Medical: No    Non-medical: No  Tobacco Use  .  Smoking status: Current Every Day Smoker    Packs/day: 1.00    Years: 36.00    Pack years: 36.00    Types: Cigarettes  . Smokeless tobacco: Never Used  Substance and Sexual Activity  . Alcohol use: Yes    Comment: Chronic ETOH abuse  . Drug use: No  . Sexual activity: Not Currently  Lifestyle  . Physical activity:    Days per week: 0 days    Minutes per session: 0 min  . Stress: Rather much  Relationships  . Social connections:    Talks on phone: More than three times a week    Gets together: Three times a week    Attends religious service: Never    Active member of club or organization: No    Attends meetings of clubs or organizations: Never    Relationship status: Divorced  . Intimate partner violence:    Fear of  current or ex partner: Patient refused    Emotionally abused: Patient refused    Physically abused: Patient refused    Forced sexual activity: Patient refused  Other Topics Concern  . Not on file  Social History Narrative   Former Environmental consultant.   Started graduate school in counseling, but didn't finish when a good job came open.   Lives alone.   One daughter lives in [place].   Another daughter is in [place]    The other three live in [place], along with her mother.   Father lives in the [place], [place] area.     Physical Exam  Vital Signs and Nursing Notes reviewed Vitals:   06/27/18 1705  BP: 95/72  Pulse: 75  Resp: 16  Temp: 98.2 F (36.8 C)  SpO2: 100%    CONSTITUTIONAL: Chronically ill-appearing, NAD NEURO:  Alert and oriented x 3, moving all extremities, normal and symmetric strength and sensation EYES:  eyes equal and reactive ENT/NECK:  no LAD, no JVD CARDIO: Regular rate, well-perfused, normal S1 and S2 PULM:  CTAB no wheezing or rhonchi GI/GU:  normal bowel sounds, nontender, distended abdomen MSK/SPINE:  No gross deformities, 2+ pitting edema to the bilateral lower extremities SKIN:  no rash, atraumatic PSYCH:  Appropriate speech and behavior  Diagnostic and Interventional Summary    Labs Reviewed - No data to display  No orders to display    Medications - No data to display   Procedures Critical Care  ED Course and Medical Decision Making  I have reviewed the triage vital signs and the nursing notes.  Pertinent labs & imaging results that were available during my care of the patient were reviewed by me and considered in my medical decision making (see below for details).  Patient here with pain consistent with muscle soreness, patient's chronic edema likely also contributing to her pain.  Patient explains that she has run out of her Lasix and her spironolactone.  According to chart review, she has significant ascites related to her  alcoholic cirrhosis, has been provided contact information for [facility] to receive outpatient paracentesis.  Patient seems to be noncompliant with this as well as medications.  However patient has no abdominal pain or tenderness and has really no complaints regarding the abdomen today.  She has normal vital signs, blood pressure is soft but consistent with her typical blood pressure.  She has a normal neurological exam with no evidence of cauda equina or myelopathy.  Nothing on exam to suggest septic joint.  Full screening exam complete, no indication for further  testing, will avoid opioid pain medications, will provide muscle relaxers, refill diuretics, strongly encourage outpatient paracentesis follow-up.  Elmer SowMichael M. Pilar PlateBero, MD [email or web address] [email or web address]  Final Clinical Impressions(s) / ED Diagnoses     ICD-10-CM   1. Muscle soreness M79.10   2. Edema, unspecified type R60.9     ED Discharge Orders         Ordered     06/27/18 1714     06/27/18 1714    spironolactone (ALDACTONE) 25 MG tablet  Daily     06/27/18 1714    methocarbamol (ROBAXIN) 500 MG tablet  2 times daily PRN     06/27/18 1719    furosemide (LASIX) 40 MG tablet  Daily     06/27/18 1719             Sabas SousBero, Innocence Schlotzhauer M, MD 06/27/18 1720

## 2018-06-27 NOTE — ED Notes (Signed)
Awaiting PTAR at this time.

## 2018-06-27 NOTE — ED Triage Notes (Signed)
Per EMS-states she is having chronic pain all over-patient very demanding, wanting to be placed on O2, wanting blankets-patient is well know to this facility

## 2018-06-27 NOTE — ED Notes (Signed)
Pt provided with PO fluids and meal at this time.

## 2018-06-27 NOTE — Discharge Instructions (Signed)
You were evaluated in the Emergency Department and after careful evaluation, we did not find any emergent condition requiring admission or further testing in the hospital.  Your symptoms today seem to be due to muscle soreness from your recent increased activity.  Your leg swelling is also likely contributing to your pain.  It is very important that you feel these prescriptions and take as directed.  Please call the GI specialist to help you manage your liver issues.  Please return to the Emergency Department if you experience any worsening of your condition.  We encourage you to follow up with a primary care provider.  Thank you for allowing [place] to be a part of your care.

## 2018-06-30 ENCOUNTER — Inpatient Hospital Stay (HOSPITAL_COMMUNITY)
Admission: EM | Admit: 2018-06-30 | Discharge: 2018-07-04 | DRG: 603 | Discharge status: Against Medical Advice | Payer: Medicare Other | Attending: Internal Medicine | Admitting: Internal Medicine

## 2018-06-30 ENCOUNTER — Encounter (HOSPITAL_COMMUNITY): Payer: Self-pay

## 2018-06-30 ENCOUNTER — Emergency Department (HOSPITAL_COMMUNITY): Payer: Medicare Other

## 2018-06-30 DIAGNOSIS — Z79899 Other long term (current) drug therapy: Secondary | ICD-10-CM

## 2018-06-30 DIAGNOSIS — K7031 Alcoholic cirrhosis of liver with ascites: Secondary | ICD-10-CM | POA: Diagnosis present

## 2018-06-30 DIAGNOSIS — Z8249 Family history of ischemic heart disease and other diseases of the circulatory system: Secondary | ICD-10-CM

## 2018-06-30 DIAGNOSIS — R140 Abdominal distension (gaseous): Secondary | ICD-10-CM

## 2018-06-30 DIAGNOSIS — L03119 Cellulitis of unspecified part of limb: Secondary | ICD-10-CM

## 2018-06-30 DIAGNOSIS — Z6834 Body mass index (BMI) 34.0-34.9, adult: Secondary | ICD-10-CM

## 2018-06-30 DIAGNOSIS — T502X5A Adverse effect of carbonic-anhydrase inhibitors, benzothiadiazides and other diuretics, initial encounter: Secondary | ICD-10-CM | POA: Diagnosis present

## 2018-06-30 DIAGNOSIS — K219 Gastro-esophageal reflux disease without esophagitis: Secondary | ICD-10-CM | POA: Diagnosis present

## 2018-06-30 DIAGNOSIS — L03115 Cellulitis of right lower limb: Principal | ICD-10-CM | POA: Diagnosis present

## 2018-06-30 DIAGNOSIS — I480 Paroxysmal atrial fibrillation: Secondary | ICD-10-CM | POA: Diagnosis present

## 2018-06-30 DIAGNOSIS — Z1159 Encounter for screening for other viral diseases: Secondary | ICD-10-CM

## 2018-06-30 DIAGNOSIS — R609 Edema, unspecified: Secondary | ICD-10-CM

## 2018-06-30 DIAGNOSIS — Z820 Family history of epilepsy and other diseases of the nervous system: Secondary | ICD-10-CM

## 2018-06-30 DIAGNOSIS — Z8701 Personal history of pneumonia (recurrent): Secondary | ICD-10-CM

## 2018-06-30 DIAGNOSIS — Z720 Tobacco use: Secondary | ICD-10-CM | POA: Diagnosis present

## 2018-06-30 DIAGNOSIS — M810 Age-related osteoporosis without current pathological fracture: Secondary | ICD-10-CM | POA: Diagnosis present

## 2018-06-30 DIAGNOSIS — F319 Bipolar disorder, unspecified: Secondary | ICD-10-CM | POA: Diagnosis present

## 2018-06-30 DIAGNOSIS — E876 Hypokalemia: Secondary | ICD-10-CM | POA: Diagnosis present

## 2018-06-30 DIAGNOSIS — F909 Attention-deficit hyperactivity disorder, unspecified type: Secondary | ICD-10-CM | POA: Diagnosis present

## 2018-06-30 DIAGNOSIS — G35D Multiple sclerosis, unspecified: Secondary | ICD-10-CM | POA: Diagnosis present

## 2018-06-30 DIAGNOSIS — F17210 Nicotine dependence, cigarettes, uncomplicated: Secondary | ICD-10-CM | POA: Diagnosis present

## 2018-06-30 DIAGNOSIS — J9611 Chronic respiratory failure with hypoxia: Secondary | ICD-10-CM | POA: Diagnosis present

## 2018-06-30 DIAGNOSIS — F1020 Alcohol dependence, uncomplicated: Secondary | ICD-10-CM | POA: Diagnosis present

## 2018-06-30 DIAGNOSIS — J449 Chronic obstructive pulmonary disease, unspecified: Secondary | ICD-10-CM | POA: Diagnosis present

## 2018-06-30 DIAGNOSIS — M1710 Unilateral primary osteoarthritis, unspecified knee: Secondary | ICD-10-CM | POA: Diagnosis present

## 2018-06-30 DIAGNOSIS — Z9981 Dependence on supplemental oxygen: Secondary | ICD-10-CM

## 2018-06-30 DIAGNOSIS — E8770 Fluid overload, unspecified: Secondary | ICD-10-CM | POA: Diagnosis not present

## 2018-06-30 DIAGNOSIS — D638 Anemia in other chronic diseases classified elsewhere: Secondary | ICD-10-CM | POA: Diagnosis present

## 2018-06-30 DIAGNOSIS — L03116 Cellulitis of left lower limb: Secondary | ICD-10-CM | POA: Diagnosis present

## 2018-06-30 DIAGNOSIS — R600 Localized edema: Secondary | ICD-10-CM

## 2018-06-30 DIAGNOSIS — G35 Multiple sclerosis: Secondary | ICD-10-CM | POA: Diagnosis present

## 2018-06-30 DIAGNOSIS — I878 Other specified disorders of veins: Secondary | ICD-10-CM | POA: Diagnosis present

## 2018-06-30 LAB — CBC WITH DIFFERENTIAL/PLATELET
Abs Immature Granulocytes: 0.01 10*3/uL (ref 0.00–0.07)
Basophils Absolute: 0.1 10*3/uL (ref 0.0–0.1)
Basophils Relative: 2 %
Eosinophils Absolute: 0.1 10*3/uL (ref 0.0–0.5)
Eosinophils Relative: 3 %
HCT: 30.6 % — ABNORMAL LOW (ref 36.0–46.0)
Hemoglobin: 8.9 g/dL — ABNORMAL LOW (ref 12.0–15.0)
Immature Granulocytes: 0 %
Lymphocytes Relative: 27 %
Lymphs Abs: 1 10*3/uL (ref 0.7–4.0)
MCH: 24.1 pg — ABNORMAL LOW (ref 26.0–34.0)
MCHC: 29.1 g/dL — ABNORMAL LOW (ref 30.0–36.0)
MCV: 82.9 fL (ref 80.0–100.0)
Monocytes Absolute: 0.5 10*3/uL (ref 0.1–1.0)
Monocytes Relative: 13 %
Neutro Abs: 1.9 10*3/uL (ref 1.7–7.7)
Neutrophils Relative %: 55 %
Platelets: 143 10*3/uL — ABNORMAL LOW (ref 150–400)
RBC: 3.69 MIL/uL — ABNORMAL LOW (ref 3.87–5.11)
RDW: 22.9 % — ABNORMAL HIGH (ref 11.5–15.5)
WBC: 3.5 10*3/uL — ABNORMAL LOW (ref 4.0–10.5)
nRBC: 0 % (ref 0.0–0.2)

## 2018-06-30 LAB — ETHANOL: Alcohol, Ethyl (B): 157 mg/dL — ABNORMAL HIGH (ref <10)

## 2018-06-30 LAB — COMPREHENSIVE METABOLIC PANEL
ALT: 16 U/L (ref 0–44)
AST: 33 U/L (ref 15–41)
Albumin: 2.5 g/dL — ABNORMAL LOW (ref 3.5–5.0)
Alkaline Phosphatase: 104 U/L (ref 38–126)
Anion gap: 11 (ref 5–15)
BUN: <5 mg/dL — ABNORMAL LOW (ref 6–20)
CO2: 27 mmol/L (ref 22–32)
Calcium: 7.9 mg/dL — ABNORMAL LOW (ref 8.9–10.3)
Chloride: 101 mmol/L (ref 98–111)
Creatinine, Ser: 0.42 mg/dL — ABNORMAL LOW (ref 0.44–1.00)
GFR calc Af Amer: >60 mL/min (ref >60)
GFR calc non Af Amer: >60 mL/min (ref >60)
Glucose, Bld: 92 mg/dL (ref 70–99)
Potassium: 3.1 mmol/L — ABNORMAL LOW (ref 3.5–5.1)
Sodium: 139 mmol/L (ref 135–145)
Total Bilirubin: 1.7 mg/dL — ABNORMAL HIGH (ref 0.3–1.2)
Total Protein: 6.2 g/dL — ABNORMAL LOW (ref 6.5–8.1)

## 2018-06-30 LAB — SARS CORONAVIRUS 2 BY RT PCR (HOSPITAL ORDER, PERFORMED IN ~~LOC~~ HOSPITAL LAB): SARS Coronavirus 2: NEGATIVE

## 2018-06-30 LAB — LACTIC ACID, PLASMA: Lactic Acid, Venous: 1.9 mmol/L (ref 0.5–1.9)

## 2018-06-30 LAB — BRAIN NATRIURETIC PEPTIDE: B Natriuretic Peptide: 81.9 pg/mL (ref 0.0–100.0)

## 2018-06-30 MED ORDER — CLINDAMYCIN PHOSPHATE 600 MG/50ML IV SOLN
600.0000 mg | Freq: Once | INTRAVENOUS | Status: AC
  Administered 2018-06-30: 21:00:00 600 mg via INTRAVENOUS
  Filled 2018-06-30: qty 50

## 2018-06-30 MED ORDER — ALBUTEROL SULFATE HFA 108 (90 BASE) MCG/ACT IN AERS
4.0000 | INHALATION_SPRAY | Freq: Once | RESPIRATORY_TRACT | Status: AC
  Administered 2018-06-30: 19:00:00 4 via RESPIRATORY_TRACT
  Filled 2018-06-30: qty 6.7

## 2018-06-30 MED ORDER — FUROSEMIDE 10 MG/ML IJ SOLN
40.0000 mg | Freq: Once | INTRAMUSCULAR | Status: AC
  Administered 2018-06-30: 19:00:00 40 mg via INTRAVENOUS
  Filled 2018-06-30: qty 4

## 2018-06-30 NOTE — ED Triage Notes (Signed)
Per EMS:  Pt from home.  Pt c/o bilateral leg pain x2 days.  Pt had "3 glasses" of wine today.  Pt satting 88 on arrival.  Pt put on 1.5 L [place].  Pt c/o of leg swelling.

## 2018-06-30 NOTE — ED Notes (Signed)
Pt unable to tolerate COVID swab.

## 2018-06-30 NOTE — ED Notes (Signed)
Patient provided with frozen meal tray.

## 2018-06-30 NOTE — ED Provider Notes (Signed)
[facility]-EMERGENCY DEPT Provider Note   CSN: [id number] Arrival date & time: 06/30/18  1634    History   Chief Complaint No chief complaint on file.   HPI Anne Black is a 58 y.o. female.     Patient is a 57 year old female who presents with leg pain and swelling.  She has a history of EtOH abuse, alcoholic cirrhosis, A. fib with RVR, COPD on oxygen at 2 L/min and chronic leg swelling.  She is a frequent visitor to the emergency department.  She previously had agreed to be admitted to a skilled nursing facility which is where she was residing until this last week.  She states after 1 of her ED visits, the nursing home would not let her back in because she did not have a COVID test.  She currently is living at home.  She complains of pain diffusely to her lower extremities.  She has chronic swelling.  She states she has been taking her diuretics but does have a history of noncompliance in the past.  She denies any known fevers.  No vomiting.  She feels like her breathing is little worse than it normally is.  No significant coughing.  No associated back pain.     Past Medical History:  Diagnosis Date  . ADHD (attention deficit hyperactivity disorder)   . Alcoholic cirrhosis (HCC)    with ascites  . Alcoholism (HCC)   . Allergies   . Anxiety   . Arthritis   . Atrial fibrillation with RVR (HCC)   . Bipolar disorder (HCC)   . COPD (chronic obstructive pulmonary disease) (HCC)    wears 2L chronic O2  . Dyspnea   . EtOH dependence (HCC) 05/14/2018  . GERD (gastroesophageal reflux disease)   . History of hiatal hernia   . Hypokalemia   . Migraine   . Multiple sclerosis (HCC)   . Narcolepsy   . Osteoarthritis of knee   . Osteoporosis   . Pneumonia     Patient Active Problem List   Diagnosis Date Noted  . Atrial fibrillation with RVR (HCC) 06/13/2018  . Alcohol dependence (HCC) 05/06/2018  . Electrolyte disturbance 05/05/2018  . Alcohol withdrawal  delirium (HCC) 04/28/2018  . Weakness generalized 04/27/2018  . Gait abnormality 04/24/2018  . Chronic hypoxemic respiratory failure (HCC) 04/11/2018  . Atrial fibrillation with rapid ventricular response (HCC) 03/23/2018  . Head injury   . Fall 03/19/2018  . Syncope 03/19/2018  . Orbital fracture 03/19/2018  . Scalp laceration 03/19/2018  . Acute respiratory failure with hypoxia and hypercarbia (HCC) 03/19/2018  . Abdominal tenderness 03/19/2018  . Acute on chronic respiratory failure with hypoxia (HCC) 03/17/2018  . Alcohol abuse with intoxication (HCC) 03/17/2018  . ETOH abuse   . Normocytic anemia 03/16/2018  . Acute metabolic encephalopathy 03/16/2018  . Lower leg edema 03/16/2018  . Migraine 03/16/2018  . Community acquired pneumonia of left lower lobe of lung (HCC)   . Weakness 08/21/2017  . COPD exacerbation (HCC) 06/12/2017  . Chest pain with moderate risk for cardiac etiology 05/30/2017  . Abnormal LFTs 05/30/2017  . Tachycardia 05/30/2017  . COPD (chronic obstructive pulmonary disease) (HCC) 04/19/2017  . Right rib fracture 04/15/2017  . Abdominal wall cellulitis 12/30/2016  . S/P exploratory laparotomy 12/30/2016  . Distal radius fracture, left 09/09/2016  . Thrombocytopenia (HCC) 09/09/2016  . Chest x-ray abnormality   . Umbilical hernia without obstruction and without gangrene 05/12/2016  . Itching 02/24/2016  . Ventral hernia  without obstruction or gangrene 02/24/2016  . Palliative care encounter   . Ascites   . Tachypnea   . Atrial flutter with rapid ventricular response (HCC)   . Hypomagnesemia   . Hypoxia   . Dehydration 01/22/2016  . Low back ache 12/30/2015  . Non-intractable vomiting with nausea   . Alcohol abuse with alcohol-induced mood disorder (HCC) 12/15/2015  . Acute alcoholism (HCC)   . Abdominal distension 11/04/2015  . Alcohol abuse   . Dyspnea   . Acute respiratory failure (HCC) 10/15/2015  . Ascites due to alcoholic cirrhosis (HCC)  10/15/2015  . Osteoporosis   . Multiple sclerosis (HCC)   . Alcoholic cirrhosis of liver with ascites (HCC) 08/22/2015  . Bipolar disorder, current episode mixed, moderate (HCC) 08/01/2015  . Alcohol use disorder, severe, dependence (HCC) 07/31/2015  . Macrocytic anemia- due to alcohol abuse with normal B12 & folate levels 05/31/2015  . Severe protein-calorie malnutrition (HCC) 05/31/2015  . Hypokalemia 05/26/2015  . Encephalopathy, hepatic (HCC) 05/26/2015  . Alcohol withdrawal (HCC) 05/18/2015  . Abdominal pain 05/18/2015  . Anxiety 05/18/2015  . Stimulant abuse (HCC) 12/27/2014  . Nicotine abuse 12/27/2014  . Hyperprolactinemia (HCC) 11/15/2014  . Dyslipidemia   . Alcohol use disorder, moderate, dependence (HCC) 10/30/2014  . Tobacco abuse 01/23/2014  . Bipolar 1 disorder (HCC) 04/07/2013  . Noncompliance with therapeutic plan 04/04/2013  . Hereditary and idiopathic peripheral neuropathy 05/01/2012  . Depression 05/01/2012  . GERD (gastroesophageal reflux disease) 05/01/2012  . Osteoarthrosis, unspecified whether generalized or localized, involving lower leg 05/01/2012  . Hyponatremia 07/01/2011    Past Surgical History:  Procedure Laterality Date  . ABDOMINAL WALL DEFECT REPAIR N/A 12/30/2016   Procedure: EXPLORATORY LAPAROTOMY WITH REPAIR ABDOMINAL WALL VENTRAL HERNIA;  Surgeon: [name], MD;  Location: [facility] OR;  Service: General;  Laterality: N/A;  . APPLICATION OF WOUND VAC N/A 12/30/2016   Procedure: APPLICATION OF WOUND VAC;  Surgeon: [name], MD;  Location: [facility] OR;  Service: General;  Laterality: N/A;  . CESAREAN SECTION  [phone]  . FRACTURE SURGERY    . HERNIA REPAIR    . IR PARACENTESIS  06/14/2018  . MYRINGOTOMY WITH TUBE PLACEMENT Bilateral   . ORIF WRIST FRACTURE Left 09/09/2016   Procedure: OPEN REDUCTION INTERNAL FIXATION (ORIF) LEFT WRIST FRACTURE, LEFT CARPAL TUNNEL RELEASE;  Surgeon: Dominica Severin, MD;  Location: [facility] ORS;  Service:  Orthopedics;  Laterality: Left;  . TONSILLECTOMY       OB History   No obstetric history on file.      Home Medications    Prior to Admission medications   Medication Sig Start Date End Date Taking? Authorizing Provider  acetaminophen (TYLENOL) 500 MG tablet Take 1 tablet (500 mg total) by mouth every 6 (six) hours as needed for mild pain. Patient taking differently: Take 1,000 mg by mouth every 6 (six) hours as needed for mild pain.  03/28/18   Rolly Salter, MD  diltiazem (CARDIZEM CD) 120 MG 24 hr capsule Take 1 capsule (120 mg total) by mouth daily for 30 days. 04/14/18 05/30/18  Tegeler, Canary Brim, MD  furosemide (LASIX) 40 MG tablet Take 1 tablet (40 mg total) by mouth daily. 06/27/18   Sabas Sous, MD  gabapentin (NEURONTIN) 100 MG capsule Take 2 capsules (200 mg total) by mouth 3 (three) times daily for 30 days. 05/19/18 06/18/18  Lorre Nick, MD  hydrOXYzine (ATARAX/VISTARIL) 25 MG tablet Take 1 tablet (25 mg total) by mouth daily as needed for  up to 30 doses (anxiety). Patient taking differently: Take 25 mg by mouth daily as needed for anxiety (withdrawal).  04/14/18   Tegeler, Canary Brim, MD  magnesium oxide (MAG-OX) 400 MG tablet Take 400 mg by mouth daily.    [provider]  methocarbamol (ROBAXIN) 500 MG tablet Take 1 tablet (500 mg total) by mouth 2 (two) times daily as needed for muscle spasms. 06/27/18   Sabas Sous, MD  ondansetron (ZOFRAN) 4 MG tablet Take 4 mg by mouth every 8 (eight) hours as needed for nausea or vomiting.    [provider]  pantoprazole (PROTONIX) 40 MG tablet Take 1 tablet (40 mg total) by mouth daily for 30 days. 04/14/18 05/30/18  Tegeler, Canary Brim, MD  potassium chloride SA (K-DUR,KLOR-CON) 20 MEQ tablet Take 2 tablets (40 mEq total) by mouth 2 (two) times daily. 04/19/18   Bethel Born, PA-C  spironolactone (ALDACTONE) 25 MG tablet Take 1 tablet (25 mg total) by mouth daily for 30 days. 06/27/18 07/27/18  Sabas Sous, MD    Family History Family History  Problem Relation Age of Onset  . Arrhythmia Mother   . Neuropathy Mother   . Coronary artery disease Mother   . Heart disease Mother   . Heart disease Father   . Hypertension Father   . Heart attack Father   . Multiple sclerosis Maternal Aunt     Social History Social History   Tobacco Use  . Smoking status: Current Every Day Smoker    Packs/day: 1.00    Years: 36.00    Pack years: 36.00    Types: Cigarettes  . Smokeless tobacco: Never Used  Substance Use Topics  . Alcohol use: Yes    Comment: Chronic ETOH abuse  . Drug use: No     Allergies   Patient has no known allergies.   Review of Systems Review of Systems  Constitutional: Positive for fatigue. Negative for chills, diaphoresis and fever.  HENT: Negative for congestion, rhinorrhea and sneezing.   Eyes: Negative.   Respiratory: Positive for shortness of breath. Negative for cough and chest tightness.   Cardiovascular: Positive for leg swelling. Negative for chest pain.  Gastrointestinal: Negative for abdominal pain, blood in stool, diarrhea, nausea and vomiting.  Genitourinary: Negative for difficulty urinating, flank pain, frequency and hematuria.  Musculoskeletal: Positive for myalgias. Negative for arthralgias and back pain.  Skin: Negative for rash.  Neurological: Negative for dizziness, speech difficulty, weakness, numbness and headaches.     Physical Exam Updated Vital Signs BP (!) 97/55   Pulse 97   Temp 98.5 F (36.9 C) (Rectal)   Resp 17   SpO2 96%   Physical Exam Constitutional:      Appearance: She is well-developed.  HENT:     Head: Normocephalic and atraumatic.  Eyes:     Pupils: Pupils are equal, round, and reactive to light.  Neck:     Musculoskeletal: Normal range of motion and neck supple.  Cardiovascular:     Rate and Rhythm: Normal rate and regular rhythm.     Heart sounds: Normal heart sounds.  Pulmonary:     Effort:  Pulmonary effort is normal. No respiratory distress.     Breath sounds: Normal breath sounds. No wheezing or rales.  Chest:     Chest wall: No tenderness.  Abdominal:     General: Bowel sounds are normal. There is distension.     Palpations: Abdomen is soft.     Tenderness:  There is no abdominal tenderness. There is no guarding or rebound.  Musculoskeletal: Normal range of motion.     Comments: 3+ pitting edema to the bilateral lower extremities with warmth and erythema bilaterally up to her abdomen  Lymphadenopathy:     Cervical: No cervical adenopathy.  Skin:    General: Skin is warm and dry.     Findings: No rash.  Neurological:     Mental Status: She is alert and oriented to person, place, and time.      ED Treatments / Results  Labs (all labs ordered are listed, but only abnormal results are displayed) Labs Reviewed  COMPREHENSIVE METABOLIC PANEL - Abnormal; Notable for the following components:      Result Value   Potassium 3.1 (*)    BUN <5 (*)    Creatinine, Ser 0.42 (*)    Calcium 7.9 (*)    Total Protein 6.2 (*)    Albumin 2.5 (*)    Total Bilirubin 1.7 (*)    All other components within normal limits  CBC WITH DIFFERENTIAL/PLATELET - Abnormal; Notable for the following components:   WBC 3.5 (*)    RBC 3.69 (*)    Hemoglobin 8.9 (*)    HCT 30.6 (*)    MCH 24.1 (*)    MCHC 29.1 (*)    RDW 22.9 (*)    Platelets 143 (*)    All other components within normal limits  ETHANOL - Abnormal; Notable for the following components:   Alcohol, Ethyl (B) 157 (*)    All other components within normal limits  SARS CORONAVIRUS 2 (HOSPITAL ORDER, PERFORMED IN  HOSPITAL LAB)  LACTIC ACID, PLASMA  BRAIN NATRIURETIC PEPTIDE    EKG EKG Interpretation  Date/Time:  Friday Jun 30 2018 18:01:11 EDT Ventricular Rate:  98 PR Interval:    QRS Duration: 96 QT Interval:  374 QTC Calculation: 478 R Axis:   90 Text Interpretation:  Sinus rhythm Borderline right axis  deviation Low voltage, precordial leads Borderline T abnormalities, anterior leads since last tracing no significant change Confirmed by Rolan Bucco [phone]) on 06/30/2018 8:10:35 PM   Radiology Dg Chest 1 View  Result Date: 06/30/2018 CLINICAL DATA:  Shortness of breath EXAM: CHEST  1 VIEW COMPARISON:  05/30/2018 FINDINGS: The heart size and mediastinal contours are within normal limits. Both lungs are clear. The visualized skeletal structures are unremarkable. IMPRESSION: No active disease. Electronically Signed   By: Deatra Robinson M.D.   On: 06/30/2018 18:19    Procedures Procedures (including critical care time)  Medications Ordered in ED Medications  albuterol (VENTOLIN HFA) 108 (90 Base) MCG/ACT inhaler 4 puff (4 puffs Inhalation Given 06/30/18 1847)  furosemide (LASIX) injection 40 mg (40 mg Intravenous Given 06/30/18 1848)  clindamycin (CLEOCIN) IVPB 600 mg (0 mg Intravenous Stopped 06/30/18 2138)     Initial Impression / Assessment and Plan / ED Course  I have reviewed the triage vital signs and the nursing notes.  Pertinent labs & imaging results that were available during my care of the patient were reviewed by me and considered in my medical decision making (see chart for details).        Patient is a 58 year old female who presents with leg pain related to her lower extremity edema.  Both of her legs are markedly edematous with diffuse warmth and erythema extending up to the abdomen.  She is afebrile.  Her white count is normal.  However she does not typically have the redness  associated with her chronic edema.  Given this, I am concerned about possible cellulitis.  She was given IV clindamycin.  She initially was a little short of breath on arrival but was given albuterol and this seems to have resolved.  Her vital signs are stable.  Her chest x-ray is clear without pneumonia or pulmonary edema.  There is no other suggestions of fluid overload.  She has a distended abdomen  which is consistent with her chronic ascites.  Given her marked leg changes, I did consult Dr. Ashok Pall for admission.  Final Clinical Impressions(s) / ED Diagnoses   Final diagnoses:  Cellulitis of lower extremity, unspecified laterality  Peripheral edema    ED Discharge Orders    None       Rolan Bucco, MD 06/30/18 2249

## 2018-07-01 DIAGNOSIS — I480 Paroxysmal atrial fibrillation: Secondary | ICD-10-CM | POA: Diagnosis present

## 2018-07-01 DIAGNOSIS — F1020 Alcohol dependence, uncomplicated: Secondary | ICD-10-CM | POA: Diagnosis present

## 2018-07-01 DIAGNOSIS — F319 Bipolar disorder, unspecified: Secondary | ICD-10-CM | POA: Diagnosis present

## 2018-07-01 DIAGNOSIS — K219 Gastro-esophageal reflux disease without esophagitis: Secondary | ICD-10-CM | POA: Diagnosis present

## 2018-07-01 DIAGNOSIS — T502X5A Adverse effect of carbonic-anhydrase inhibitors, benzothiadiazides and other diuretics, initial encounter: Secondary | ICD-10-CM | POA: Diagnosis present

## 2018-07-01 DIAGNOSIS — K7031 Alcoholic cirrhosis of liver with ascites: Secondary | ICD-10-CM | POA: Diagnosis present

## 2018-07-01 DIAGNOSIS — L03116 Cellulitis of left lower limb: Secondary | ICD-10-CM | POA: Diagnosis present

## 2018-07-01 DIAGNOSIS — R600 Localized edema: Secondary | ICD-10-CM

## 2018-07-01 DIAGNOSIS — E876 Hypokalemia: Secondary | ICD-10-CM | POA: Diagnosis present

## 2018-07-01 DIAGNOSIS — L03115 Cellulitis of right lower limb: Secondary | ICD-10-CM | POA: Diagnosis present

## 2018-07-01 DIAGNOSIS — J9611 Chronic respiratory failure with hypoxia: Secondary | ICD-10-CM | POA: Diagnosis present

## 2018-07-01 DIAGNOSIS — Z79899 Other long term (current) drug therapy: Secondary | ICD-10-CM | POA: Diagnosis not present

## 2018-07-01 DIAGNOSIS — R609 Edema, unspecified: Secondary | ICD-10-CM | POA: Diagnosis present

## 2018-07-01 DIAGNOSIS — Z1159 Encounter for screening for other viral diseases: Secondary | ICD-10-CM | POA: Diagnosis not present

## 2018-07-01 DIAGNOSIS — G35 Multiple sclerosis: Secondary | ICD-10-CM | POA: Diagnosis present

## 2018-07-01 DIAGNOSIS — M1710 Unilateral primary osteoarthritis, unspecified knee: Secondary | ICD-10-CM | POA: Diagnosis present

## 2018-07-01 DIAGNOSIS — Z9981 Dependence on supplemental oxygen: Secondary | ICD-10-CM | POA: Diagnosis not present

## 2018-07-01 DIAGNOSIS — J449 Chronic obstructive pulmonary disease, unspecified: Secondary | ICD-10-CM | POA: Diagnosis present

## 2018-07-01 DIAGNOSIS — M810 Age-related osteoporosis without current pathological fracture: Secondary | ICD-10-CM | POA: Diagnosis present

## 2018-07-01 DIAGNOSIS — I878 Other specified disorders of veins: Secondary | ICD-10-CM | POA: Diagnosis present

## 2018-07-01 DIAGNOSIS — F909 Attention-deficit hyperactivity disorder, unspecified type: Secondary | ICD-10-CM | POA: Diagnosis present

## 2018-07-01 DIAGNOSIS — Z6834 Body mass index (BMI) 34.0-34.9, adult: Secondary | ICD-10-CM | POA: Diagnosis not present

## 2018-07-01 DIAGNOSIS — D638 Anemia in other chronic diseases classified elsewhere: Secondary | ICD-10-CM | POA: Diagnosis present

## 2018-07-01 DIAGNOSIS — E8770 Fluid overload, unspecified: Secondary | ICD-10-CM | POA: Diagnosis not present

## 2018-07-01 DIAGNOSIS — F17210 Nicotine dependence, cigarettes, uncomplicated: Secondary | ICD-10-CM | POA: Diagnosis present

## 2018-07-01 LAB — BASIC METABOLIC PANEL
Anion gap: 8 (ref 5–15)
BUN: <5 mg/dL — ABNORMAL LOW (ref 6–20)
CO2: 32 mmol/L (ref 22–32)
Calcium: 7.4 mg/dL — ABNORMAL LOW (ref 8.9–10.3)
Chloride: 98 mmol/L (ref 98–111)
Creatinine, Ser: 0.4 mg/dL — ABNORMAL LOW (ref 0.44–1.00)
GFR calc Af Amer: >60 mL/min (ref >60)
GFR calc non Af Amer: >60 mL/min (ref >60)
Glucose, Bld: 104 mg/dL — ABNORMAL HIGH (ref 70–99)
Potassium: 2.8 mmol/L — ABNORMAL LOW (ref 3.5–5.1)
Sodium: 138 mmol/L (ref 135–145)

## 2018-07-01 LAB — PROTEIN / CREATININE RATIO, URINE
Creatinine, Urine: 49.89 mg/dL
Protein Creatinine Ratio: 0.1 mg/mg{Cre} (ref 0.00–0.15)
Total Protein, Urine: <6 mg/dL

## 2018-07-01 LAB — CBC
HCT: 28.6 % — ABNORMAL LOW (ref 36.0–46.0)
Hemoglobin: 8.4 g/dL — ABNORMAL LOW (ref 12.0–15.0)
MCH: 24.3 pg — ABNORMAL LOW (ref 26.0–34.0)
MCHC: 29.4 g/dL — ABNORMAL LOW (ref 30.0–36.0)
MCV: 82.9 fL (ref 80.0–100.0)
Platelets: 131 10*3/uL — ABNORMAL LOW (ref 150–400)
RBC: 3.45 MIL/uL — ABNORMAL LOW (ref 3.87–5.11)
RDW: 22.9 % — ABNORMAL HIGH (ref 11.5–15.5)
WBC: 3.5 10*3/uL — ABNORMAL LOW (ref 4.0–10.5)
nRBC: 0 % (ref 0.0–0.2)

## 2018-07-01 LAB — D-DIMER, QUANTITATIVE: D-Dimer, Quant: 3.33 ug/mL-FEU — ABNORMAL HIGH (ref 0.00–0.50)

## 2018-07-01 LAB — MAGNESIUM: Magnesium: 1.7 mg/dL (ref 1.7–2.4)

## 2018-07-01 IMAGING — CR DG CHEST 2V
2 series · 2 of 2 positions shown · non-contrast
Comparison: 05/28/2015

CLINICAL DATA: Fevers of unknown origin

EXAM:
CHEST  2 VIEW

[w chest lat]
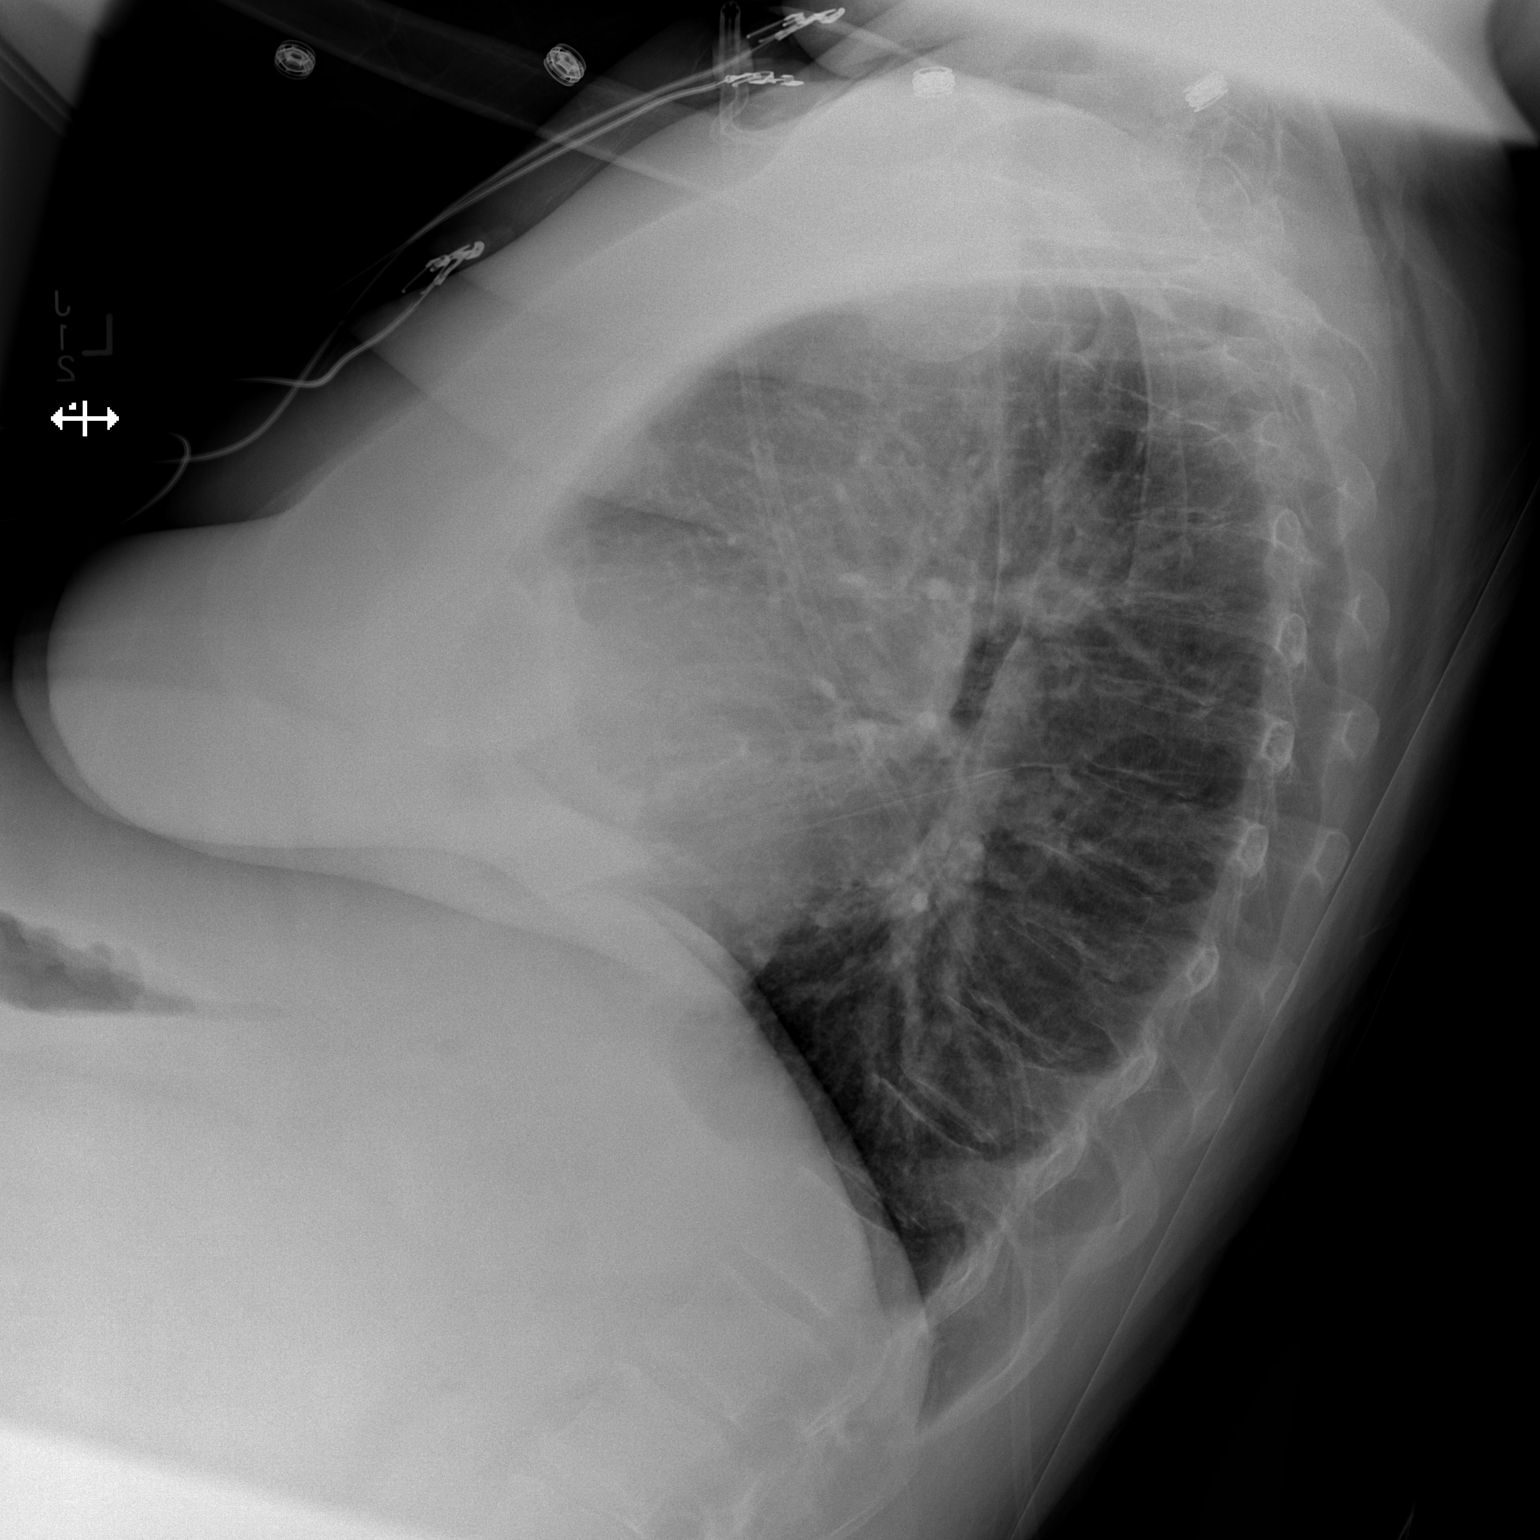

[x chest ap]
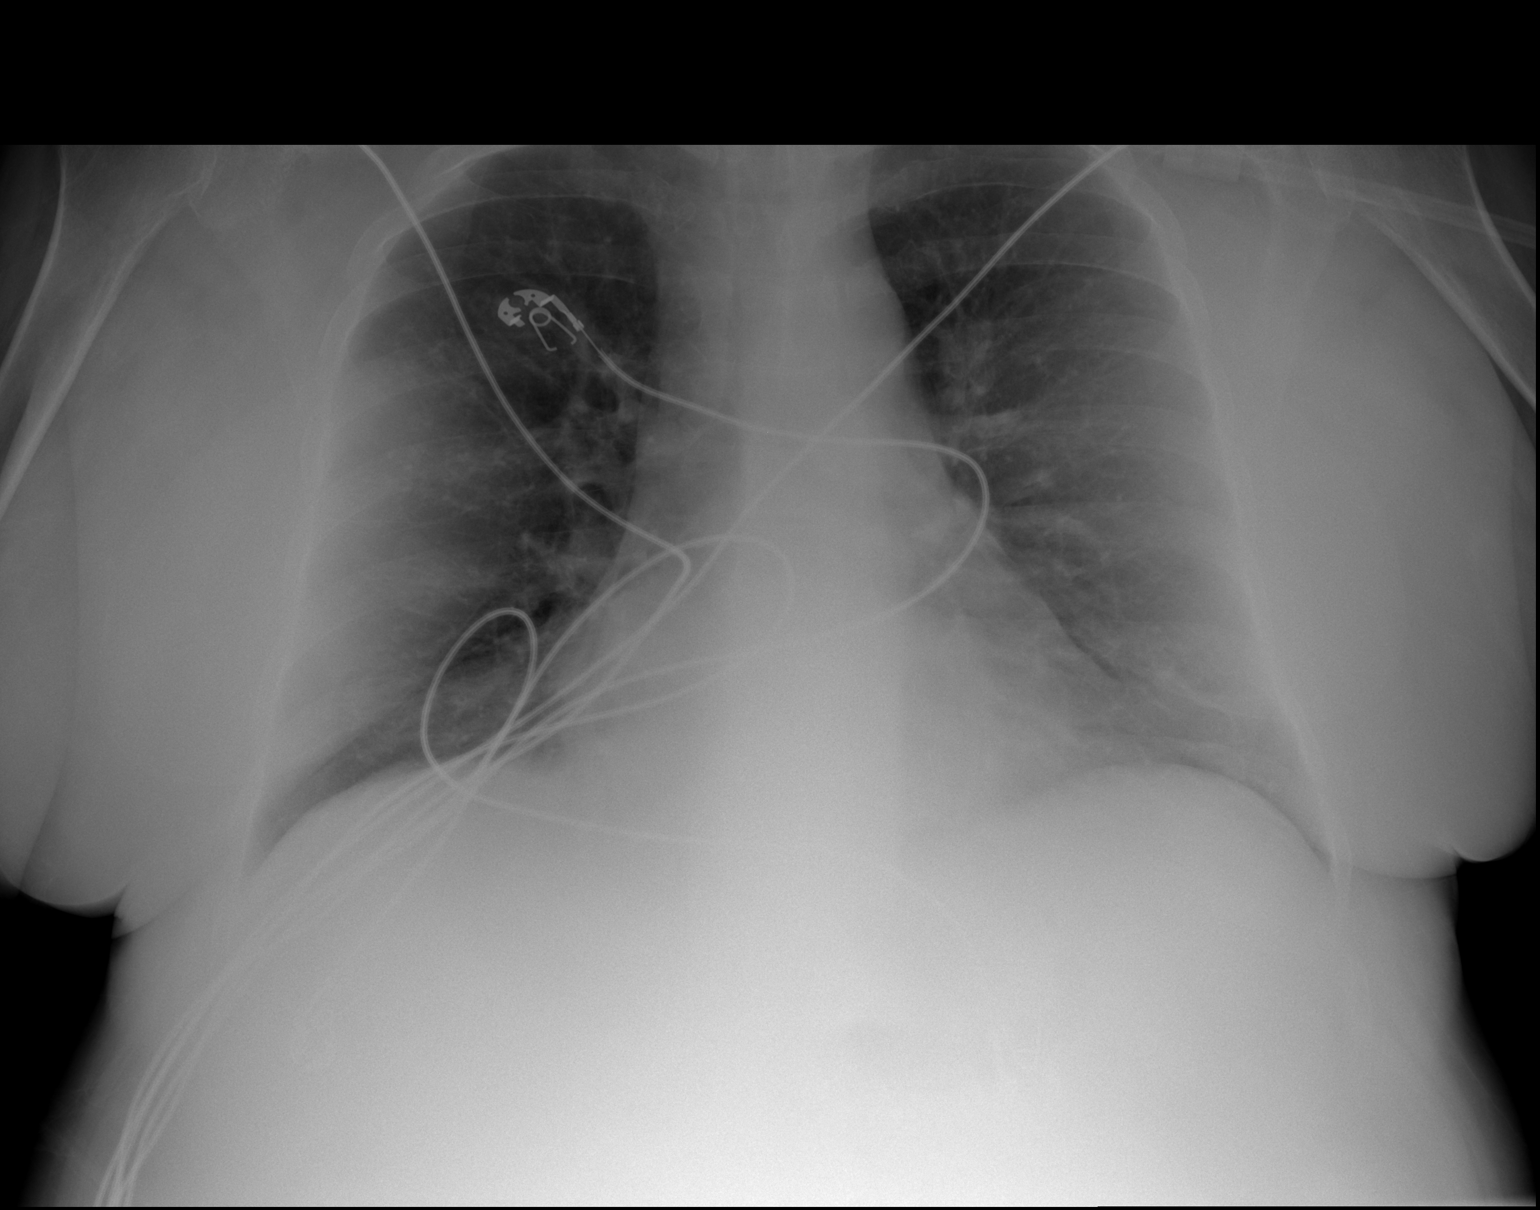

[2 of 2 positions shown; findings below may reference images not displayed]

FINDINGS: The heart size and mediastinal contours are within normal limits.
Both lungs are clear. The visualized skeletal structures are
unremarkable.
IMPRESSION: No active cardiopulmonary disease.

## 2018-07-01 MED ORDER — HYDROCODONE-ACETAMINOPHEN 5-325 MG PO TABS
1.0000 | ORAL_TABLET | Freq: Four times a day (QID) | ORAL | Status: DC | PRN
  Administered 2018-07-01: 11:00:00 2 via ORAL
  Administered 2018-07-01: 03:00:00 1 via ORAL
  Administered 2018-07-02: 08:00:00 2 via ORAL
  Filled 2018-07-01: qty 1
  Filled 2018-07-01 (×2): qty 2

## 2018-07-01 MED ORDER — VANCOMYCIN HCL IN DEXTROSE 750-5 MG/150ML-% IV SOLN
750.0000 mg | Freq: Two times a day (BID) | INTRAVENOUS | Status: DC
  Administered 2018-07-01 – 2018-07-03 (×4): 750 mg via INTRAVENOUS
  Filled 2018-07-01 (×6): qty 150

## 2018-07-01 MED ORDER — VANCOMYCIN HCL 10 G IV SOLR
1750.0000 mg | INTRAVENOUS | Status: DC
  Filled 2018-07-01: qty 1750

## 2018-07-01 MED ORDER — ONDANSETRON HCL 4 MG PO TABS
4.0000 mg | ORAL_TABLET | Freq: Three times a day (TID) | ORAL | Status: DC | PRN
  Administered 2018-07-01: 06:00:00 4 mg via ORAL
  Filled 2018-07-01 (×2): qty 1

## 2018-07-01 MED ORDER — PANTOPRAZOLE SODIUM 40 MG PO TBEC
40.0000 mg | DELAYED_RELEASE_TABLET | Freq: Every day | ORAL | Status: DC
  Administered 2018-07-01 – 2018-07-03 (×3): 40 mg via ORAL
  Filled 2018-07-01 (×3): qty 1

## 2018-07-01 MED ORDER — LORAZEPAM 1 MG PO TABS
1.0000 mg | ORAL_TABLET | Freq: Four times a day (QID) | ORAL | Status: AC | PRN
  Administered 2018-07-01 – 2018-07-03 (×7): 1 mg via ORAL
  Filled 2018-07-01 (×8): qty 1

## 2018-07-01 MED ORDER — ACETAMINOPHEN 325 MG PO TABS
650.0000 mg | ORAL_TABLET | Freq: Four times a day (QID) | ORAL | Status: DC | PRN
  Administered 2018-07-01 – 2018-07-03 (×3): 650 mg via ORAL
  Filled 2018-07-01 (×3): qty 2

## 2018-07-01 MED ORDER — SPIRONOLACTONE 25 MG PO TABS
25.0000 mg | ORAL_TABLET | Freq: Every day | ORAL | Status: DC
  Administered 2018-07-01 – 2018-07-03 (×3): 25 mg via ORAL
  Filled 2018-07-01 (×4): qty 1

## 2018-07-01 MED ORDER — POTASSIUM CHLORIDE CRYS ER 20 MEQ PO TBCR
40.0000 meq | EXTENDED_RELEASE_TABLET | Freq: Two times a day (BID) | ORAL | Status: DC
  Administered 2018-07-01 – 2018-07-03 (×7): 40 meq via ORAL
  Filled 2018-07-01: qty 2
  Filled 2018-07-01: qty 4
  Filled 2018-07-01 (×6): qty 2

## 2018-07-01 MED ORDER — SODIUM CHLORIDE 0.9 % IV SOLN
250.0000 mL | INTRAVENOUS | Status: DC | PRN
  Administered 2018-07-01: 02:00:00 250 mL via INTRAVENOUS

## 2018-07-01 MED ORDER — ADULT MULTIVITAMIN W/MINERALS CH
1.0000 | ORAL_TABLET | Freq: Every day | ORAL | Status: DC
  Administered 2018-07-01 – 2018-07-03 (×3): 1 via ORAL
  Filled 2018-07-01 (×3): qty 1

## 2018-07-01 MED ORDER — VANCOMYCIN HCL 10 G IV SOLR
2000.0000 mg | Freq: Once | INTRAVENOUS | Status: AC
  Administered 2018-07-01: 02:00:00 2000 mg via INTRAVENOUS
  Filled 2018-07-01: qty 2000

## 2018-07-01 MED ORDER — ENOXAPARIN SODIUM 40 MG/0.4ML ~~LOC~~ SOLN
40.0000 mg | Freq: Every day | SUBCUTANEOUS | Status: DC
  Administered 2018-07-01 – 2018-07-03 (×4): 40 mg via SUBCUTANEOUS
  Filled 2018-07-01 (×5): qty 0.4

## 2018-07-01 MED ORDER — HYDROXYZINE HCL 10 MG PO TABS
10.0000 mg | ORAL_TABLET | Freq: Two times a day (BID) | ORAL | Status: DC
  Administered 2018-07-02 (×2): 10 mg via ORAL
  Filled 2018-07-01 (×4): qty 1

## 2018-07-01 MED ORDER — THIAMINE HCL 100 MG/ML IJ SOLN
100.0000 mg | Freq: Every day | INTRAMUSCULAR | Status: DC
  Administered 2018-07-03: 09:00:00 100 mg via INTRAVENOUS
  Filled 2018-07-01 (×4): qty 1

## 2018-07-01 MED ORDER — DIPHENHYDRAMINE HCL 25 MG PO CAPS
25.0000 mg | ORAL_CAPSULE | Freq: Four times a day (QID) | ORAL | Status: DC | PRN
  Administered 2018-07-01 – 2018-07-04 (×6): 25 mg via ORAL
  Filled 2018-07-01 (×6): qty 1

## 2018-07-01 MED ORDER — MAGNESIUM OXIDE 400 (241.3 MG) MG PO TABS
400.0000 mg | ORAL_TABLET | Freq: Every day | ORAL | Status: DC
  Administered 2018-07-01 – 2018-07-03 (×3): 400 mg via ORAL
  Filled 2018-07-01 (×3): qty 1

## 2018-07-01 MED ORDER — DILTIAZEM HCL ER COATED BEADS 120 MG PO CP24
120.0000 mg | ORAL_CAPSULE | Freq: Every day | ORAL | Status: DC
  Administered 2018-07-01 – 2018-07-03 (×3): 120 mg via ORAL
  Filled 2018-07-01 (×4): qty 1

## 2018-07-01 MED ORDER — GABAPENTIN 100 MG PO CAPS
200.0000 mg | ORAL_CAPSULE | Freq: Three times a day (TID) | ORAL | Status: DC
  Administered 2018-07-01 – 2018-07-03 (×8): 200 mg via ORAL
  Filled 2018-07-01 (×8): qty 2

## 2018-07-01 MED ORDER — NICOTINE 21 MG/24HR TD PT24
21.0000 mg | MEDICATED_PATCH | Freq: Every day | TRANSDERMAL | Status: DC
  Administered 2018-07-01 – 2018-07-03 (×3): 21 mg via TRANSDERMAL
  Filled 2018-07-01 (×3): qty 1

## 2018-07-01 MED ORDER — FOLIC ACID 1 MG PO TABS
1.0000 mg | ORAL_TABLET | Freq: Every day | ORAL | Status: DC
  Administered 2018-07-01 – 2018-07-03 (×3): 1 mg via ORAL
  Filled 2018-07-01 (×3): qty 1

## 2018-07-01 MED ORDER — FUROSEMIDE 40 MG PO TABS
40.0000 mg | ORAL_TABLET | Freq: Every day | ORAL | Status: DC
  Administered 2018-07-01 – 2018-07-03 (×3): 40 mg via ORAL
  Filled 2018-07-01 (×4): qty 1

## 2018-07-01 MED ORDER — LORAZEPAM 2 MG/ML IJ SOLN
1.0000 mg | Freq: Four times a day (QID) | INTRAMUSCULAR | Status: AC | PRN
  Administered 2018-07-03: 18:00:00 1 mg via INTRAVENOUS
  Filled 2018-07-01: qty 1

## 2018-07-01 MED ORDER — CLINDAMYCIN PHOSPHATE 600 MG/50ML IV SOLN
600.0000 mg | Freq: Three times a day (TID) | INTRAVENOUS | Status: DC
  Administered 2018-07-01 – 2018-07-04 (×9): 600 mg via INTRAVENOUS
  Filled 2018-07-01 (×11): qty 50

## 2018-07-01 MED ORDER — LORAZEPAM 1 MG PO TABS
1.0000 mg | ORAL_TABLET | Freq: Once | ORAL | Status: AC
  Administered 2018-07-01: 15:00:00 1 mg via ORAL

## 2018-07-01 MED ORDER — VITAMIN B-1 100 MG PO TABS
100.0000 mg | ORAL_TABLET | Freq: Every day | ORAL | Status: DC
  Administered 2018-07-01 – 2018-07-02 (×2): 100 mg via ORAL
  Filled 2018-07-01 (×3): qty 1

## 2018-07-01 MED ORDER — SODIUM CHLORIDE 0.9% FLUSH: 3.0000 mL | INTRAVENOUS | Status: DC | PRN

## 2018-07-01 MED ORDER — SODIUM CHLORIDE 0.9% FLUSH
3.0000 mL | Freq: Two times a day (BID) | INTRAVENOUS | Status: DC
  Administered 2018-07-01 – 2018-07-03 (×3): 3 mL via INTRAVENOUS

## 2018-07-01 NOTE — ED Notes (Signed)
MD at bedside. 

## 2018-07-01 NOTE — Progress Notes (Signed)
Pharmacy Antibiotic Note  Anne Black is a 58 y.o. female admitted on 06/30/2018 with cellulitis.  Pharmacy has been consulted for Vancomycin dosing.  Plan: Vancomycin 2gm iv x1, then Vancomycin 1750 mg IV Q 24 hrs. Goal AUC 400-550. Expected AUC: 489 SCr used: 0.8 (adjusted)     Temp (24hrs), Avg:98.3 F (36.8 C), Min:98 F (36.7 C), Max:98.5 F (36.9 C)  Recent Labs  Lab 06/30/18 1728 06/30/18 1729  WBC 3.5*  --   CREATININE 0.42*  --   LATICACIDVEN  --  1.9    Estimated Creatinine Clearance: 80.2 mL/min (A) (by C-G formula based on SCr of 0.42 mg/dL (L)).    No Known Allergies  Antimicrobials this admission: Vancomycin 07/01/2018 >>   Dose adjustments this admission: -  Microbiology results: -  Thank you for allowing pharmacy to be a part of this patient's care.  Aleene Davidson Crowford 07/01/2018 12:58 AM

## 2018-07-01 NOTE — ED Notes (Signed)
ED TO INPATIENT HANDOFF REPORT  ED Nurse Name and Phone #: [place] G, [phone]  S Name/Age/Gender Anne Black 58 y.o. female Room/Bed: WA24/WA24  Code Status   Code Status: Prior  Home/SNF/Other Home Patient oriented to: self, place, time and situation Is this baseline? Yes   Triage Complete: Triage complete  Chief Complaint Leg Pain  Triage Note Per EMS:  Pt from home.  Pt c/o bilateral leg pain x2 days.  Pt had "3 glasses" of wine today.  Pt satting 88 on arrival.  Pt put on 1.5 L [place].  Pt c/o of leg swelling.     Allergies No Known Allergies  Level of Care/Admitting Diagnosis ED Disposition    ED Disposition Condition Comment   Admit  Hospital Area: [facility] [[id number]]  Level of Care: Med-Surg [16]  Covid Evaluation: N/A  Diagnosis: Lower extremity edema [[id number]]  Admitting Physician: Erenest Blank  Attending Physician: Kathrynn Running [[id number]]  Estimated length of stay: past midnight tomorrow  Certification:: I certify this patient will need inpatient services for at least 2 midnights  PT Class (Do Not Modify): Inpatient [101]  PT Acc Code (Do Not Modify): Private [1]       B Medical/Surgery History Past Medical History:  Diagnosis Date  . ADHD (attention deficit hyperactivity disorder)   . Alcoholic cirrhosis (HCC)    with ascites  . Alcoholism (HCC)   . Allergies   . Anxiety   . Arthritis   . Atrial fibrillation with RVR (HCC)   . Bipolar disorder (HCC)   . COPD (chronic obstructive pulmonary disease) (HCC)    wears 2L chronic O2  . Dyspnea   . EtOH dependence (HCC) 05/14/2018  . GERD (gastroesophageal reflux disease)   . History of hiatal hernia   . Hypokalemia   . Migraine   . Multiple sclerosis (HCC)   . Narcolepsy   . Osteoarthritis of knee   . Osteoporosis   . Pneumonia    Past Surgical History:  Procedure Laterality Date  . ABDOMINAL WALL DEFECT REPAIR N/A 12/30/2016   Procedure:  EXPLORATORY LAPAROTOMY WITH REPAIR ABDOMINAL WALL VENTRAL HERNIA;  Surgeon: [name], MD;  Location: [facility] OR;  Service: General;  Laterality: N/A;  . APPLICATION OF WOUND VAC N/A 12/30/2016   Procedure: APPLICATION OF WOUND VAC;  Surgeon: [name], MD;  Location: [facility] OR;  Service: General;  Laterality: N/A;  . CESAREAN SECTION  [phone]  . FRACTURE SURGERY    . HERNIA REPAIR    . IR PARACENTESIS  06/14/2018  . MYRINGOTOMY WITH TUBE PLACEMENT Bilateral   . ORIF WRIST FRACTURE Left 09/09/2016   Procedure: OPEN REDUCTION INTERNAL FIXATION (ORIF) LEFT WRIST FRACTURE, LEFT CARPAL TUNNEL RELEASE;  Surgeon: Dominica Severin, MD;  Location: [facility] ORS;  Service: Orthopedics;  Laterality: Left;  . TONSILLECTOMY       A IV Location/Drains/Wounds Patient Lines/Drains/Airways Status   Active Line/Drains/Airways    Name:   Placement date:   Placement time:   Site:   Days:   Peripheral IV 06/30/18 Right Antecubital   06/30/18    2107    Antecubital   1   External Urinary Catheter   06/14/18    0200    -   17   Wound / Incision (Open or Dehisced) 05/12/18 Laceration Lip Left   05/12/18    1100    Lip   50          Intake/Output Last 24  hours No intake or output data in the 24 hours ending 07/01/18 0052  Labs/Imaging Results for orders placed or performed during the hospital encounter of 06/30/18 (from the past 48 hour(s))  Comprehensive metabolic panel     Status: Abnormal   Collection Time: 06/30/18  5:28 PM  Result Value Ref Range   Sodium 139 135 - 145 mmol/L   Potassium 3.1 (L) 3.5 - 5.1 mmol/L   Chloride 101 98 - 111 mmol/L   CO2 27 22 - 32 mmol/L   Glucose, Bld 92 70 - 99 mg/dL   BUN <5 (L) 6 - 20 mg/dL   Creatinine, Ser 4.09 (L) 0.44 - 1.00 mg/dL   Calcium 7.9 (L) 8.9 - 10.3 mg/dL   Total Protein 6.2 (L) 6.5 - 8.1 g/dL   Albumin 2.5 (L) 3.5 - 5.0 g/dL   AST 33 15 - 41 U/L   ALT 16 0 - 44 U/L   Alkaline Phosphatase 104 38 - 126 U/L   Total Bilirubin 1.7 (H) 0.3 - 1.2  mg/dL   GFR calc non Af Amer >60 >60 mL/min   GFR calc Af Amer >60 >60 mL/min   Anion gap 11 5 - 15    Comment: Performed at [facility], [address], [place], [place] [address]  CBC with Differential     Status: Abnormal   Collection Time: 06/30/18  5:28 PM  Result Value Ref Range   WBC 3.5 (L) 4.0 - 10.5 K/uL   RBC 3.69 (L) 3.87 - 5.11 MIL/uL   Hemoglobin 8.9 (L) 12.0 - 15.0 g/dL   HCT 47.8 (L) 29.5 - 62.1 %   MCV 82.9 80.0 - 100.0 fL   MCH 24.1 (L) 26.0 - 34.0 pg   MCHC 29.1 (L) 30.0 - 36.0 g/dL   RDW 30.8 (H) 65.7 - 84.6 %   Platelets 143 (L) 150 - 400 K/uL   nRBC 0.0 0.0 - 0.2 %   Neutrophils Relative % 55 %   Neutro Abs 1.9 1.7 - 7.7 K/uL   Lymphocytes Relative 27 %   Lymphs Abs 1.0 0.7 - 4.0 K/uL   Monocytes Relative 13 %   Monocytes Absolute 0.5 0.1 - 1.0 K/uL   Eosinophils Relative 3 %   Eosinophils Absolute 0.1 0.0 - 0.5 K/uL   Basophils Relative 2 %   Basophils Absolute 0.1 0.0 - 0.1 K/uL   Immature Granulocytes 0 %   Abs Immature Granulocytes 0.01 0.00 - 0.07 K/uL   Polychromasia PRESENT    Target Cells PRESENT    Ovalocytes PRESENT     Comment: Performed at [facility], [address] [place], [place] [address]  Lactic acid, plasma     Status: None   Collection Time: 06/30/18  5:29 PM  Result Value Ref Range   Lactic Acid, Venous 1.9 0.5 - 1.9 mmol/L    Comment: Performed at [facility], [address], [place], [place] [address]  Brain natriuretic peptide     Status: None   Collection Time: 06/30/18  5:29 PM  Result Value Ref Range   B Natriuretic Peptide 81.9 0.0 - 100.0 pg/mL    Comment: Performed at [facility], [address] [place], [place] [address]  Ethanol     Status: Abnormal   Collection Time: 06/30/18  5:29 PM  Result Value Ref Range   Alcohol, Ethyl (B) 157 (H) <10 mg/dL    Comment: (NOTE) Lowest detectable limit for serum  alcohol is 10 mg/dL. For medical  purposes only. Performed at [facility], [address], [place], [place] [address]   SARS Coronavirus 2 (CEPHEID - Performed in [facility] hospital lab), Hosp Order     Status: None   Collection Time: 06/30/18  7:49 PM  Result Value Ref Range   SARS Coronavirus 2 NEGATIVE NEGATIVE    Comment: (NOTE) If result is NEGATIVE SARS-CoV-2 target nucleic acids are NOT DETECTED. The SARS-CoV-2 RNA is generally detectable in upper and lower  respiratory specimens during the acute phase of infection. The lowest  concentration of SARS-CoV-2 viral copies this assay can detect is 250  copies / mL. A negative result does not preclude SARS-CoV-2 infection  and should not be used as the sole basis for treatment or other  patient management decisions.  A negative result may occur with  improper specimen collection / handling, submission of specimen other  than nasopharyngeal swab, presence of viral mutation(s) within the  areas targeted by this assay, and inadequate number of viral copies  (<250 copies / mL). A negative result must be combined with clinical  observations, patient history, and epidemiological information. If result is POSITIVE SARS-CoV-2 target nucleic acids are DETECTED. The SARS-CoV-2 RNA is generally detectable in upper and lower  respiratory specimens dur ing the acute phase of infection.  Positive  results are indicative of active infection with SARS-CoV-2.  Clinical  correlation with patient history and other diagnostic information is  necessary to determine patient infection status.  Positive results do  not rule out bacterial infection or co-infection with other viruses. If result is PRESUMPTIVE POSTIVE SARS-CoV-2 nucleic acids MAY BE PRESENT.   A presumptive positive result was obtained on the submitted specimen  and confirmed on repeat testing.  While 2019 novel coronavirus  (SARS-CoV-2) nucleic acids may be present in the submitted sample  additional  confirmatory testing may be necessary for epidemiological  and / or clinical management purposes  to differentiate between  SARS-CoV-2 and other Sarbecovirus currently known to infect humans.  If clinically indicated additional testing with an alternate test  methodology [phone]) is advised. The SARS-CoV-2 RNA is generally  detectable in upper and lower respiratory sp ecimens during the acute  phase of infection. The expected result is Negative. Fact Sheet for Patients:  BoilerBrush.com.cy Fact Sheet for Healthcare Providers: https://pope.com/ This test is not yet approved or cleared by the [place] FDA and has been authorized for detection and/or diagnosis of SARS-CoV-2 by FDA under an Emergency Use Authorization (EUA).  This EUA will remain in effect (meaning this test can be used) for the duration of the COVID-19 declaration under Section 564(b)(1) of the Act, 21 U.S.C. section 360bbb-3(b)(1), unless the authorization is terminated or revoked sooner. Performed at [facility], [address] [place], [place] [address]    Dg Chest 1 View  Result Date: 06/30/2018 CLINICAL DATA:  Shortness of breath EXAM: CHEST  1 VIEW COMPARISON:  05/30/2018 FINDINGS: The heart size and mediastinal contours are within normal limits. Both lungs are clear. The visualized skeletal structures are unremarkable. IMPRESSION: No active disease. Electronically Signed   By: Deatra Robinson M.D.   On: 06/30/2018 18:19    Pending Labs [employer] (From admission, onward)    Start     Ordered   Signed and Held  CBC  (enoxaparin (LOVENOX)    CrCl >/= 30 ml/min)  Once,   R    Comments:  Baseline for enoxaparin  therapy IF NOT ALREADY DRAWN.  Notify MD if PLT < 100 K.    Signed and Held   Signed and Held  Creatinine, serum  (enoxaparin (LOVENOX)    CrCl >/= 30 ml/min)  Once,   R    Comments:  Baseline for enoxaparin therapy IF NOT ALREADY  DRAWN.    Signed and Held   Signed and Held  Creatinine, serum  (enoxaparin (LOVENOX)    CrCl >/= 30 ml/min)  Weekly,   R    Comments:  while on enoxaparin therapy    Signed and Held   Signed and Held  Basic metabolic panel  Tomorrow morning,   R     Signed and Held   Signed and Held  Magnesium  Add-on,   R     Signed and Held   Signed and Held  Protein / creatinine ratio, urine  ONCE - STAT,   STAT     Signed and Held   Signed and Held  D-dimer, quantitative (not at [name])  Add-on,   R     Signed and Held          Vitals/Pain Today's Vitals   06/30/18 2230 06/30/18 2300 06/30/18 2330 07/01/18 0030  BP: (!) 97/55 (!) 100/56 (!) 100/57 109/61  Pulse: 97 96 96 96  Resp: Temp:      TempSrc:      SpO2: 96% 96% 96% 96%  PainSc:        Isolation Precautions No active isolations  Medications Medications  albuterol (VENTOLIN HFA) 108 (90 Base) MCG/ACT inhaler 4 puff (4 puffs Inhalation Given 06/30/18 1847)  furosemide (LASIX) injection 40 mg (40 mg Intravenous Given 06/30/18 1848)  clindamycin (CLEOCIN) IVPB 600 mg (0 mg Intravenous Stopped 06/30/18 2138)    Mobility manual wheelchair Moderate fall risk

## 2018-07-01 NOTE — Evaluation (Signed)
Physical Therapy Evaluation Patient Details Name: Anne Black MRN: [id number] DOB: [birth year] Today's Date: 07/01/2018   History of Present Illness  58 yo female recently admitted with Afib/SVT, fall. PMHx of MS, A fib, bipolar d/o, ETOH cirrhosis, ADHD, ascites, arthritis, COPD, on home oxygen.  Pt admitted 5/22 for Lower extremity edema - baseline edema likely 2/2 advanced cirrhosis and underlying venous stasis  Clinical Impression  Pt admitted with above diagnosis. Pt currently with functional limitations due to the deficits listed below (see PT Problem List).  Pt will benefit from skilled PT to increase their independence and safety with mobility to allow discharge to the venue listed below.  Pt requesting assist for using BSC on arrival.  Pt assisted to/from [facility] and presents with LE edema and reports increased "itchiness all over"  Pt perseverating on itching most of session and RN into room end of session.  Pt lives alone and had recent admission this month so recommend SNF upon d/c if possible.   Follow Up Recommendations SNF;Supervision/Assistance - 24 hour    Equipment Recommendations  None recommended by PT    Recommendations for Other Services       Precautions / Restrictions Precautions Precautions: Fall Precaution Comments: on home oxygen      Mobility  Bed Mobility Overal bed mobility: Needs Assistance Bed Mobility: Supine to Sit;Sit to Supine     Supine to sit: Min assist Sit to supine: Min assist   General bed mobility comments: assist for trunk upright and LEs onto bed, cues for self assisting as able  Transfers Overall transfer level: Needs assistance Equipment used: 2 person hand held assist Transfers: Sit to/from Stand Sit to Stand: Min assist Stand pivot transfers: Min assist       General transfer comment: verbal cues for safe technique, pt declined RW and requested HHA, utilized bed rail and BSC armrests; only transferred to/from [facility] per pt due  to itchiness  Ambulation/Gait             General Gait Details: pt declined  Careers information officer    Modified Rankin (Stroke Patients Only)       Balance Overall balance assessment: Needs assistance;History of Falls         Standing balance support: Bilateral upper extremity supported Standing balance-Leahy Scale: Poor Standing balance comment: requires UE support                             Pertinent Vitals/Pain Pain Assessment: Faces Faces Pain Scale: Hurts whole lot Pain Location: everywhere Pain Descriptors / Indicators: Tingling(tingling and itchy all over ) Pain Intervention(s): Monitored during session;Repositioned(RN into room and aware)    Home Living Family/patient expects to be discharged to:: Private residence Living Arrangements: Alone Available Help at Discharge: Family;Available PRN/intermittently Type of Home: House Home Access: Level entry     Home Layout: Two level;Able to live on main level with bedroom/bathroom Home Equipment: Dan Humphreys - 2 wheels;Cane - single point;Wheelchair - manual;Shower seat Additional Comments: above per previous admission history taking    Prior Function Level of Independence: Independent with assistive device(s)         Comments: Uses walker. History of self neglect. (Per previous admission history taking)     Hand Dominance        Extremity/Trunk Assessment        Lower Extremity Assessment Lower Extremity Assessment: Generalized  weakness;RLE deficits/detail;LLE deficits/detail RLE Deficits / Details: pitting edema in upper legs, swelling of LEs    Cervical / Trunk Assessment Cervical / Trunk Assessment: Normal  Communication   Communication: No difficulties  Cognition Arousal/Alertness: Awake/alert Behavior During Therapy: WFL for tasks assessed/performed Overall Cognitive Status: No family/caregiver present to determine baseline cognitive functioning                                  General Comments: pt perseverating on "itchiness" all over today; RN into room and aware; pt admits to drinking "2 glasses of wine, most days"      General Comments      Exercises     Assessment/Plan    PT Assessment Patient needs continued PT services  PT Problem List Decreased strength;Decreased balance;Decreased mobility;Decreased activity tolerance;Decreased knowledge of use of DME       PT Treatment Interventions DME instruction;Gait training;Therapeutic activities;Functional mobility training;Balance training;Patient/family education;Therapeutic exercise    PT Goals (Current goals can be found in the Care Plan section)  Acute Rehab PT Goals PT Goal Formulation: With patient Time For Goal Achievement: 07/15/18 Potential to Achieve Goals: Good    Frequency Min 3X/week   Barriers to discharge        Co-evaluation               AM-PAC PT "6 Clicks" Mobility  Outcome Measure Help needed turning from your back to your side while in a flat bed without using bedrails?: A Little Help needed moving from lying on your back to sitting on the side of a flat bed without using bedrails?: A Little Help needed moving to and from a bed to a chair (including a wheelchair)?: A Little Help needed standing up from a chair using your arms (e.g., wheelchair or bedside chair)?: A Little Help needed to walk in hospital room?: A Lot Help needed climbing 3-5 steps with a railing? : Total 6 Click Score: 15    End of Session Equipment Utilized During Treatment: Gait belt Activity Tolerance: Other (comment)(pt reporting feeling itchy all over) Patient left: with call bell/phone within reach;in bed;with bed alarm set   PT Visit Diagnosis: Muscle weakness (generalized) (M62.81);Difficulty in walking, not elsewhere classified (R26.2);Pain    Time: 3329-5188 PT Time Calculation (min) (ACUTE ONLY): 19 min   Charges:   PT Evaluation $PT Eval Low  Complexity: 1 Low         Zenovia Jarred, PT, DPT Acute Rehabilitation Services Office: [phone] Pager: [phone]  Sarajane Jews 07/01/2018, 3:10 PM

## 2018-07-01 NOTE — H&P (Addendum)
History and Physical    Anne Black WUJ:[id number] DOB: Mar 25, 1960 DOA: 06/30/2018  PCP: Patient, No Pcp Per  Patient coming from: home   Chief Complaint: lower extremity swelling and pain  HPI: Anne Black is a 58 y.o. female with medical history significant for morbid obesity, alcoholic cirrhosis with varices and chronic ascites, copd, bipolar disorder, paroxysmal a-fib not anticoagulated, smoking, and chronic respiratory failure on home o2, presenting w/ the above.  Patient somnolent but arousable. Appears upset and only answers with short phrases. When asked why she is here, she says she doesn't know. When told that she reported leg swelling and pain, she agrees that yes, that's why she came.  She say she has chronic lower extremity swelling but says it has worsened and is not more painful than normal. Also more red than normal. Bilateral. No fevers. Says she is compliant with her home medications. Was recently admitted, discharged 5/12, treated for a fib w/ rvr and therapeutic paracentesis of 6 L performed. Extensive frequent admission history.  She denies cough or shortness of breath changed from baseline. She denies chest pain. She denies abdominal pain or nausea/vomiting. She denies history of dvt/pe.  ED Course: labs, clindomycin, lasix, albuterol  Review of Systems: As per HPI otherwise 10 point review of systems negative.    Past Medical History:  Diagnosis Date  . ADHD (attention deficit hyperactivity disorder)   . Alcoholic cirrhosis (HCC)    with ascites  . Alcoholism (HCC)   . Allergies   . Anxiety   . Arthritis   . Atrial fibrillation with RVR (HCC)   . Bipolar disorder (HCC)   . COPD (chronic obstructive pulmonary disease) (HCC)    wears 2L chronic O2  . Dyspnea   . EtOH dependence (HCC) 05/14/2018  . GERD (gastroesophageal reflux disease)   . History of hiatal hernia   . Hypokalemia   . Migraine   . Multiple sclerosis (HCC)   . Narcolepsy   .  Osteoarthritis of knee   . Osteoporosis   . Pneumonia     Past Surgical History:  Procedure Laterality Date  . ABDOMINAL WALL DEFECT REPAIR N/A 12/30/2016   Procedure: EXPLORATORY LAPAROTOMY WITH REPAIR ABDOMINAL WALL VENTRAL HERNIA;  Surgeon: [name], MD;  Location: [facility] OR;  Service: General;  Laterality: N/A;  . APPLICATION OF WOUND VAC N/A 12/30/2016   Procedure: APPLICATION OF WOUND VAC;  Surgeon: [name], MD;  Location: [facility] OR;  Service: General;  Laterality: N/A;  . CESAREAN SECTION  [phone]  . FRACTURE SURGERY    . HERNIA REPAIR    . IR PARACENTESIS  06/14/2018  . MYRINGOTOMY WITH TUBE PLACEMENT Bilateral   . ORIF WRIST FRACTURE Left 09/09/2016   Procedure: OPEN REDUCTION INTERNAL FIXATION (ORIF) LEFT WRIST FRACTURE, LEFT CARPAL TUNNEL RELEASE;  Surgeon: Dominica Severin, MD;  Location: [facility] ORS;  Service: Orthopedics;  Laterality: Left;  . TONSILLECTOMY       reports that she has been smoking cigarettes. She has a 36.00 pack-year smoking history. She has never used smokeless tobacco. She reports current alcohol use. She reports that she does not use drugs.  No Known Allergies  Family History  Problem Relation Age of Onset  . Arrhythmia Mother   . Neuropathy Mother   . Coronary artery disease Mother   . Heart disease Mother   . Heart disease Father   . Hypertension Father   . Heart attack Father   . Multiple sclerosis Maternal Aunt  Prior to Admission medications   Medication Sig Start Date End Date Taking? Authorizing Provider  acetaminophen (TYLENOL) 500 MG tablet Take 1 tablet (500 mg total) by mouth every 6 (six) hours as needed for mild pain. Patient taking differently: Take 1,000 mg by mouth every 6 (six) hours as needed for mild pain.  03/28/18   Rolly Salter, MD  diltiazem (CARDIZEM CD) 120 MG 24 hr capsule Take 1 capsule (120 mg total) by mouth daily for 30 days. 04/14/18 05/30/18  Tegeler, Canary Brim, MD  furosemide (LASIX) 40 MG tablet  Take 1 tablet (40 mg total) by mouth daily. 06/27/18   Sabas Sous, MD  gabapentin (NEURONTIN) 100 MG capsule Take 2 capsules (200 mg total) by mouth 3 (three) times daily for 30 days. 05/19/18 06/18/18  Lorre Nick, MD  hydrOXYzine (ATARAX/VISTARIL) 25 MG tablet Take 1 tablet (25 mg total) by mouth daily as needed for up to 30 doses (anxiety). Patient taking differently: Take 25 mg by mouth daily as needed for anxiety (withdrawal).  04/14/18   Tegeler, Canary Brim, MD  magnesium oxide (MAG-OX) 400 MG tablet Take 400 mg by mouth daily.    [provider]  methocarbamol (ROBAXIN) 500 MG tablet Take 1 tablet (500 mg total) by mouth 2 (two) times daily as needed for muscle spasms. 06/27/18   Sabas Sous, MD  ondansetron (ZOFRAN) 4 MG tablet Take 4 mg by mouth every 8 (eight) hours as needed for nausea or vomiting.    [provider]  pantoprazole (PROTONIX) 40 MG tablet Take 1 tablet (40 mg total) by mouth daily for 30 days. 04/14/18 05/30/18  Tegeler, Canary Brim, MD  potassium chloride SA (K-DUR,KLOR-CON) 20 MEQ tablet Take 2 tablets (40 mEq total) by mouth 2 (two) times daily. 04/19/18   Bethel Born, PA-C  spironolactone (ALDACTONE) 25 MG tablet Take 1 tablet (25 mg total) by mouth daily for 30 days. 06/27/18 07/27/18  Sabas Sous, MD    Physical Exam: Vitals:   06/30/18 2230 06/30/18 2300 06/30/18 2330 07/01/18 0030  BP: (!) 97/55 (!) 100/56 (!) 100/57 109/61  Pulse: 97 96 96 96  Resp: 17 16 17 19   Temp:      TempSrc:      SpO2: 96% 96% 96% 96%    Constitutional: No acute distress, obese Head: Atraumatic Eyes: Conjunctiva clear ENM: Moist mucous membranes. poordentition.  Neck: Supple Respiratory: rales at bases, poor effort, scattered rhonchi. Cardiovascular: Regular rate and rhythm. No murmurs/rubs/gallops. Abdomen: very distended, non-tender Musculoskeletal: No joint deformity upper and lower extremities. Normal ROM, no contractures. Decreased muscle  tone.  Skin: warmth and erythema of calves extending to knees. Pitting edema moderate to abdomen and trace in hands Extremities: warm Neurologic: Alert after speaking for a while, moving all 4 extremities. Psychiatric: appears upset.   Labs on Admission: I have personally reviewed following labs and imaging studies  CBC: Recent Labs  Lab 06/30/18 1728  WBC 3.5*  NEUTROABS 1.9  HGB 8.9*  HCT 30.6*  MCV 82.9  PLT 143*   Basic Metabolic Panel: Recent Labs  Lab 06/30/18 1728  NA 139  K 3.1*  CL 101  CO2 27  GLUCOSE 92  BUN <5*  CREATININE 0.42*  CALCIUM 7.9*   GFR: Estimated Creatinine Clearance: 80.2 mL/min (A) (by C-G formula based on SCr of 0.42 mg/dL (L)). Liver Function Tests: Recent Labs  Lab 06/30/18 1728  AST 33  ALT 16  ALKPHOS 104  BILITOT 1.7*  PROT 6.2*  ALBUMIN 2.5*   No results for input(s): LIPASE, AMYLASE in the last 168 hours. No results for input(s): AMMONIA in the last 168 hours. Coagulation Profile: No results for input(s): INR, PROTIME in the last 168 hours. Cardiac Enzymes: No results for input(s): CKTOTAL, CKMB, CKMBINDEX, TROPONINI in the last 168 hours. BNP (last 3 results) No results for input(s): PROBNP in the last 8760 hours. HbA1C: No results for input(s): HGBA1C in the last 72 hours. CBG: No results for input(s): GLUCAP in the last 168 hours. Lipid Profile: No results for input(s): CHOL, HDL, LDLCALC, TRIG, CHOLHDL, LDLDIRECT in the last 72 hours. Thyroid Function Tests: No results for input(s): TSH, T4TOTAL, FREET4, T3FREE, THYROIDAB in the last 72 hours. Anemia Panel: No results for input(s): VITAMINB12, FOLATE, FERRITIN, TIBC, IRON, RETICCTPCT in the last 72 hours. Urine analysis:    Component Value Date/Time   COLORURINE YELLOW 06/13/2018 2002   APPEARANCEUR CLEAR 06/13/2018 2002   LABSPEC 1.006 06/13/2018 2002   PHURINE 6.0 06/13/2018 2002   GLUCOSEU NEGATIVE 06/13/2018 2002   HGBUR NEGATIVE 06/13/2018 2002    BILIRUBINUR NEGATIVE 06/13/2018 2002   BILIRUBINUR moderate (A) 06/19/2015 1906   KETONESUR NEGATIVE 06/13/2018 2002   PROTEINUR NEGATIVE 06/13/2018 2002   UROBILINOGEN >=8.0 06/19/2015 1906   UROBILINOGEN 0.2 09/14/2011 2141   NITRITE NEGATIVE 06/13/2018 2002   LEUKOCYTESUR NEGATIVE 06/13/2018 2002    Radiological Exams on Admission: Dg Chest 1 View  Result Date: 06/30/2018 CLINICAL DATA:  Shortness of breath EXAM: CHEST  1 VIEW COMPARISON:  05/30/2018 FINDINGS: The heart size and mediastinal contours are within normal limits. Both lungs are clear. The visualized skeletal structures are unremarkable. IMPRESSION: No active disease. Electronically Signed   By: Deatra Robinson M.D.   On: 06/30/2018 18:19    EKG: Independently reviewed. nsr  Assessment/Plan Active Problems:   Bipolar 1 disorder (HCC)   Tobacco abuse   Alcohol use disorder, moderate, dependence (HCC)   Nicotine abuse   Hypokalemia   Alcoholic cirrhosis of liver with ascites (HCC)   Multiple sclerosis (HCC)   Lower extremity edema   Chronic hypoxemic respiratory failure (HCC)   # Lower extremity edema - baseline edema likely 2/2 advanced cirrhosis and underlying venous stasis. Here bnp normal, recent UAs showing no proteinuria, recent TSH normal. ED physician who knows patient well says degree of edema is worsened, and new is erythema and warmth. No fevers but do think infection is possible. Given multiple recent admissions will cover for mrsa. Multiple risk factors for dvt as well. Patient endorses adherence with her diuretics but poor med adherence has been noted in the past. Ascites appears to have fully reaccumulated. - vanc per pharmacy consult to start - check d dimer, consider PVLs if elevated - resume lasix/spironolactone - consider LV paracentesis - check UPC - compression stockings  # Hypokalemia - likely 2/2 diuretics. Here K 3.1 - resume home k supplementation - check Mg level  # alcoholism - last drink  earlier today, unable or won't tell me exactly when, or quantify amount of alcohol consumed daily. No s/s withdrawal - ciwa, lorazepam  # Cirrhosis, uncompensated - resume lasix/spironolactone - consider initiation of sbp ppx  # Anemia, normocytic - recent iron studies suggestive of mixed iron deficiency and chronic disease - consider iron supplementation if patient amenable  # Chronic hypoxic respiratory failure - stable, on home o2 of 2 L - continue [place] O2  # Nicotine abuse - nicotine patch    DVT prophylaxis:  lovenox Code Status: full  Family Communication: none (patient says has no contacts)  Disposition Plan: tbd  Consults called: none  Admission status: med/surg    Silvano BilisNoah B Husain Costabile MD [facility] Pager [phone]  If 7PM-7AM, please contact night-coverage [email or web address] Password [email or web address]  07/01/2018, 12:41 AM

## 2018-07-01 NOTE — Plan of Care (Signed)
58 year old female lives alone history of alcohol abuse called EMS as her legs were hurting.  She has chronic bilateral lower extremity edema unsure if she is taking her medications as prescribed.  And today she has bilateral erythema warmth tenderness to both lower extremities extending above her knees.  Used to smoke and she has oxygen at home and continues to abuse alcohol.  Patient was recently admitted to the hospital and discharged on Jun 20, 2018 she was treated for A. fib with RVR and had 6 L paracentesis done.  Continue vancomycin and clindamycin.  I will obtain a MRSA PCR and ammonia level follow-up labs tomorrow.  Continue diuretics.

## 2018-07-01 NOTE — ED Notes (Signed)
Report given to Tiffany, RN. 

## 2018-07-01 NOTE — Progress Notes (Signed)
Pharmacy Antibiotic Note  Anne Black is a 58 y.o. female admitted on 06/30/2018 with cellulitis.  Pharmacy has been consulted for Vancomycin dosing.  Plan: Vancomycin 2gm IV x1, then Vancomycin 750 mg IV Q 12 hrs. Goal AUC 400-550. Expected AUC: 604 SCr used: 0.8 (adjusted) Vd 0.5 Clinda 600 IV q8h     Temp (24hrs), Avg:98.1 F (36.7 C), Min:97.7 F (36.5 C), Max:98.5 F (36.9 C)  Recent Labs  Lab 06/30/18 1728 06/30/18 1729 07/01/18 0359  WBC 3.5*  --  3.5*  CREATININE 0.42*  --  0.40*  LATICACIDVEN  --  1.9  --     Estimated Creatinine Clearance: 80.2 mL/min (A) (by C-G formula based on SCr of 0.4 mg/dL (L)).    No Known Allergies Antimicrobials this admission:  Vancomycin 07/01/2018 >> Clinda  5/22 >> Dose adjustments: 5/23 change 1750 q24 to 750 q12  Microbiology results:  5/22 SARS CoV2 neg 5/17 SARS CoV2 neg 5/23 MRSA PCR ordered 5/13 SARS CoV2 neg 5/7 MRSA PCR neg  Thank you for allowing pharmacy to be a part of this patient's care.  Herby Abraham, Pharm.D 07/01/2018 12:51 PM

## 2018-07-02 ENCOUNTER — Inpatient Hospital Stay (HOSPITAL_COMMUNITY): Payer: Medicare Other

## 2018-07-02 ENCOUNTER — Other Ambulatory Visit: Payer: Self-pay

## 2018-07-02 DIAGNOSIS — R600 Localized edema: Secondary | ICD-10-CM

## 2018-07-02 DIAGNOSIS — L03119 Cellulitis of unspecified part of limb: Secondary | ICD-10-CM

## 2018-07-02 DIAGNOSIS — R140 Abdominal distension (gaseous): Secondary | ICD-10-CM

## 2018-07-02 DIAGNOSIS — R609 Edema, unspecified: Secondary | ICD-10-CM

## 2018-07-02 DIAGNOSIS — F1020 Alcohol dependence, uncomplicated: Secondary | ICD-10-CM

## 2018-07-02 DIAGNOSIS — K7031 Alcoholic cirrhosis of liver with ascites: Secondary | ICD-10-CM

## 2018-07-02 LAB — COMPREHENSIVE METABOLIC PANEL
ALT: 14 U/L (ref 0–44)
AST: 31 U/L (ref 15–41)
Albumin: 2.2 g/dL — ABNORMAL LOW (ref 3.5–5.0)
Alkaline Phosphatase: 91 U/L (ref 38–126)
Anion gap: 6 (ref 5–15)
BUN: <5 mg/dL — ABNORMAL LOW (ref 6–20)
CO2: 31 mmol/L (ref 22–32)
Calcium: 7.6 mg/dL — ABNORMAL LOW (ref 8.9–10.3)
Chloride: 98 mmol/L (ref 98–111)
Creatinine, Ser: 0.41 mg/dL — ABNORMAL LOW (ref 0.44–1.00)
GFR calc Af Amer: >60 mL/min (ref >60)
GFR calc non Af Amer: >60 mL/min (ref >60)
Glucose, Bld: 134 mg/dL — ABNORMAL HIGH (ref 70–99)
Potassium: 3.6 mmol/L (ref 3.5–5.1)
Sodium: 135 mmol/L (ref 135–145)
Total Bilirubin: 2 mg/dL — ABNORMAL HIGH (ref 0.3–1.2)
Total Protein: 5.6 g/dL — ABNORMAL LOW (ref 6.5–8.1)

## 2018-07-02 LAB — AMMONIA: Ammonia: 99 umol/L — ABNORMAL HIGH (ref 9–35)

## 2018-07-02 IMAGING — US US PARACENTESIS
1 series · 9 of 9 positions shown · non-contrast
Comparison: None.

MEDICATIONS:
10 cc 1% lidocaine

COMPLICATIONS:
None immediate.

INDICATION: History of alcohol abuse and cirrhosis now with symptomatic
intra-abdominal ascites. Request made for ultrasound-guided
paracentesis.

EXAM:
ULTRASOUND-GUIDED PARACENTESIS
TECHNIQUE: Informed written consent was obtained from the patient after a
discussion of the risks, benefits and alternatives to treatment. A
timeout was performed prior to the initiation of the procedure.

[Series 1: us paracentesis · 0.26mm/px · 9 of 9 slices shown]
[im 1/9]
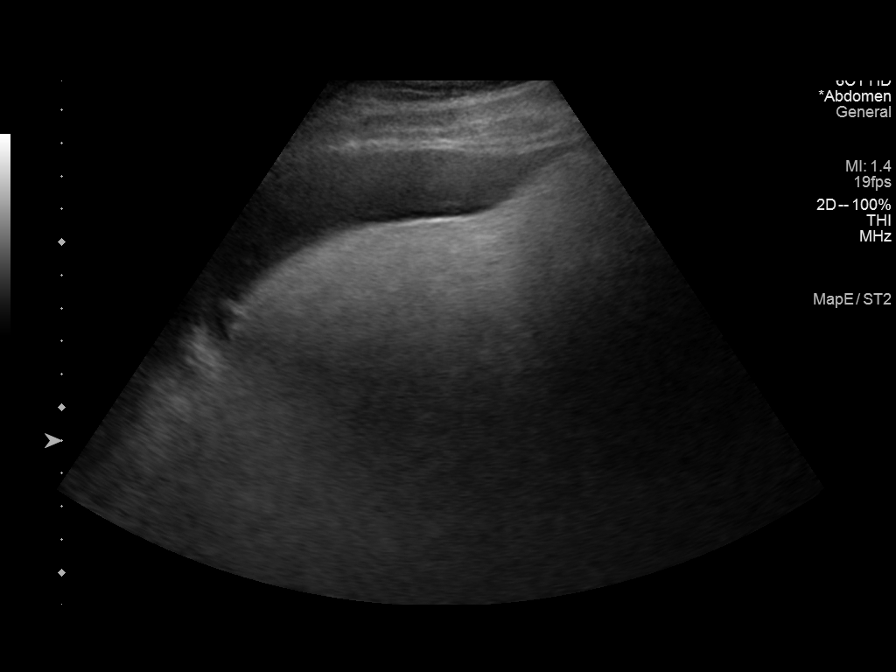
[im 2/9]
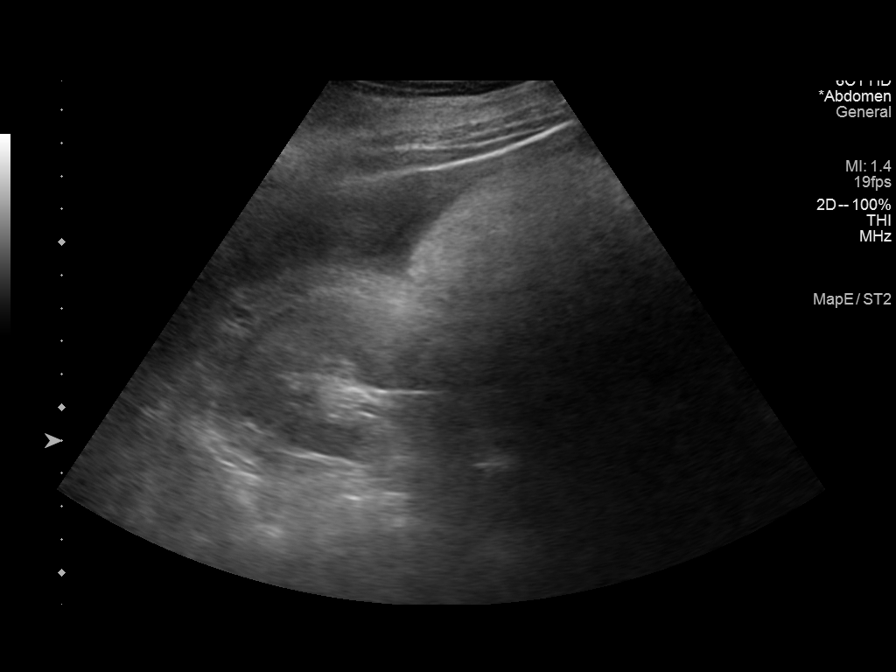
[im 3/9]
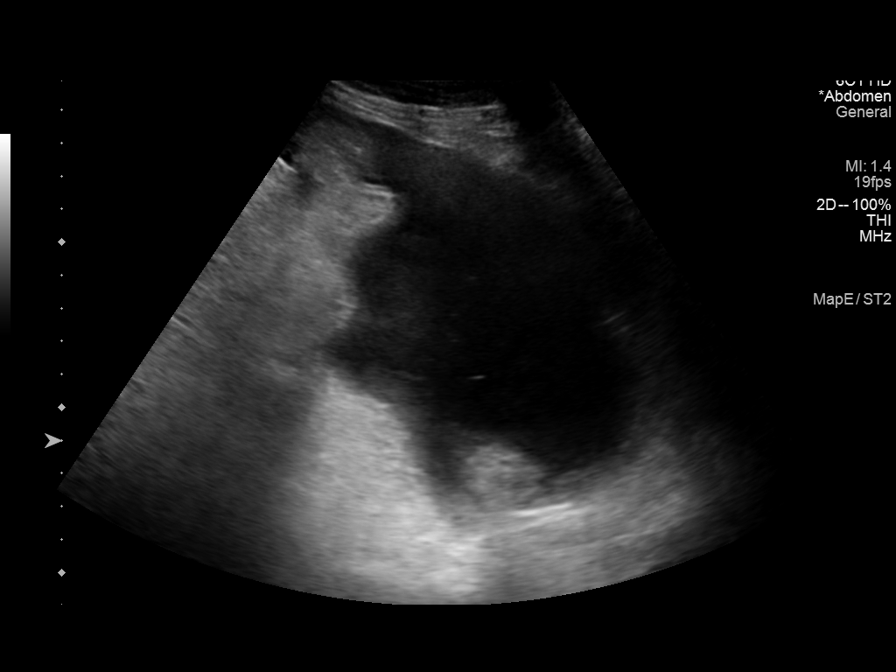
[im 4/9]
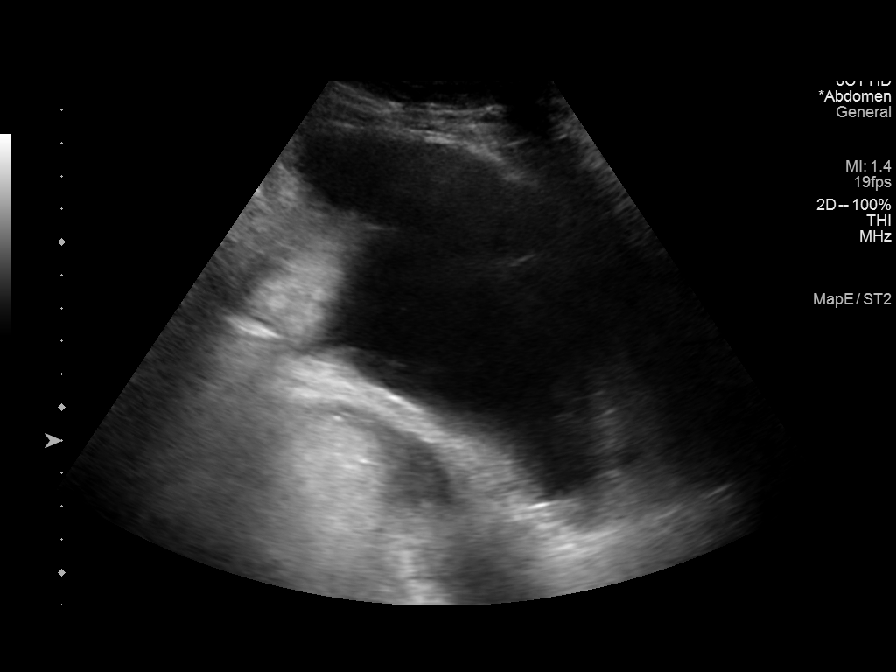
[im 5/9]
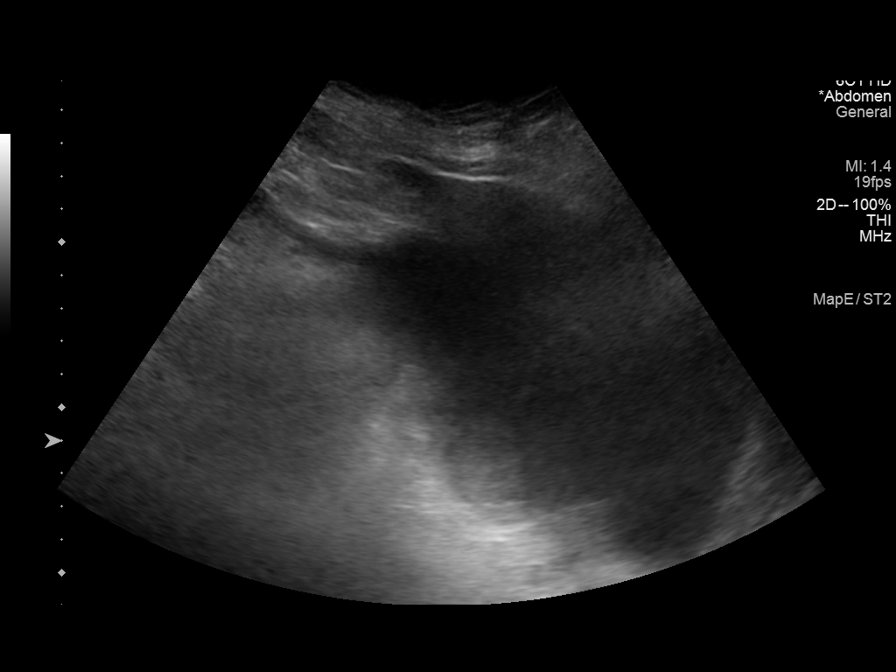
[im 6/9]
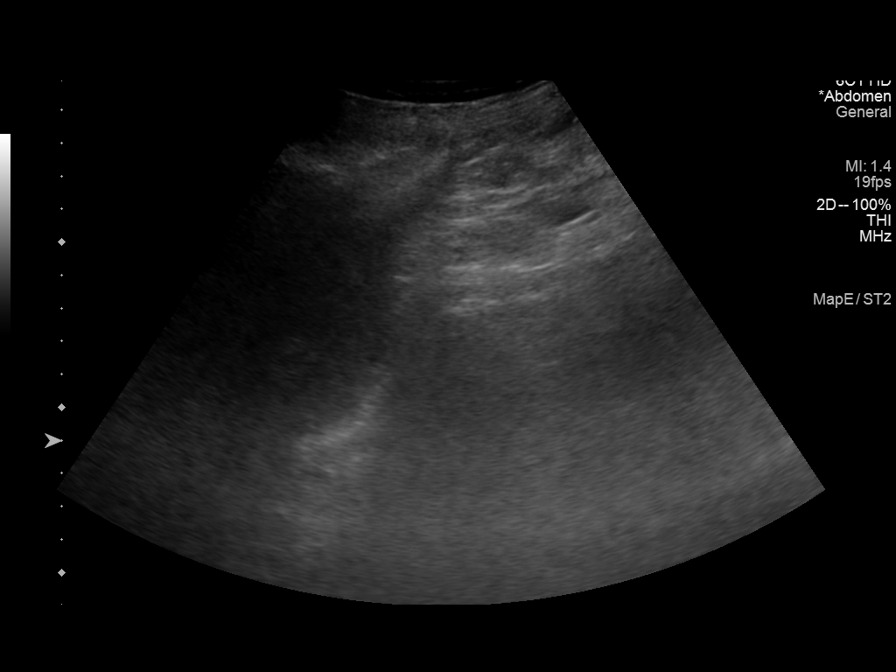
[im 7/9]
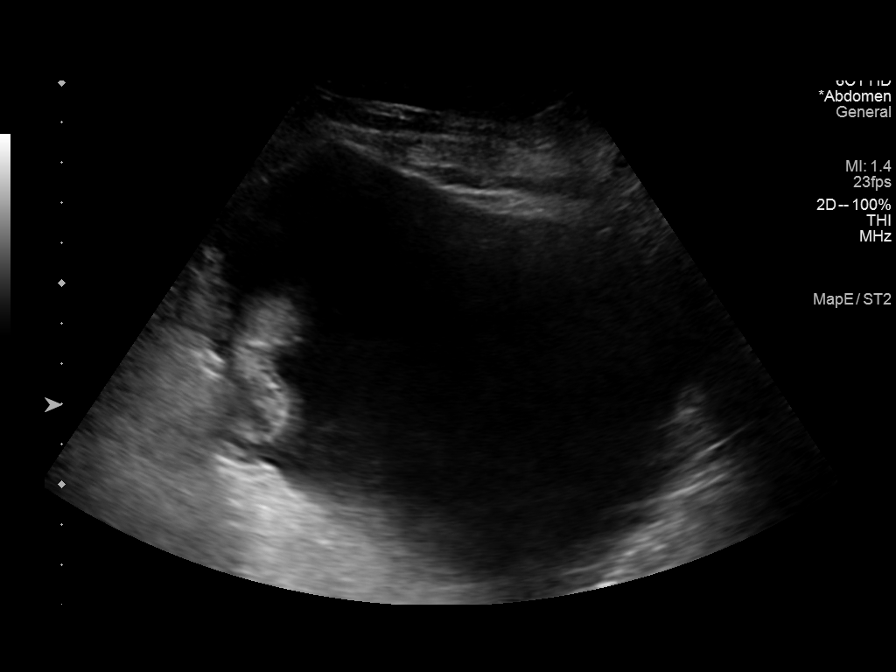
[im 8/9]
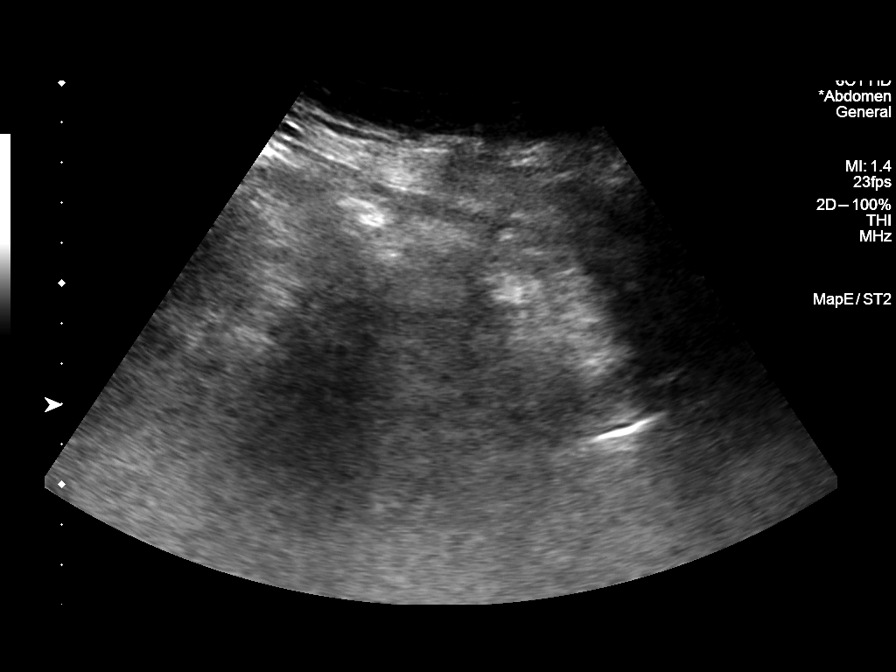
[im 9/9]
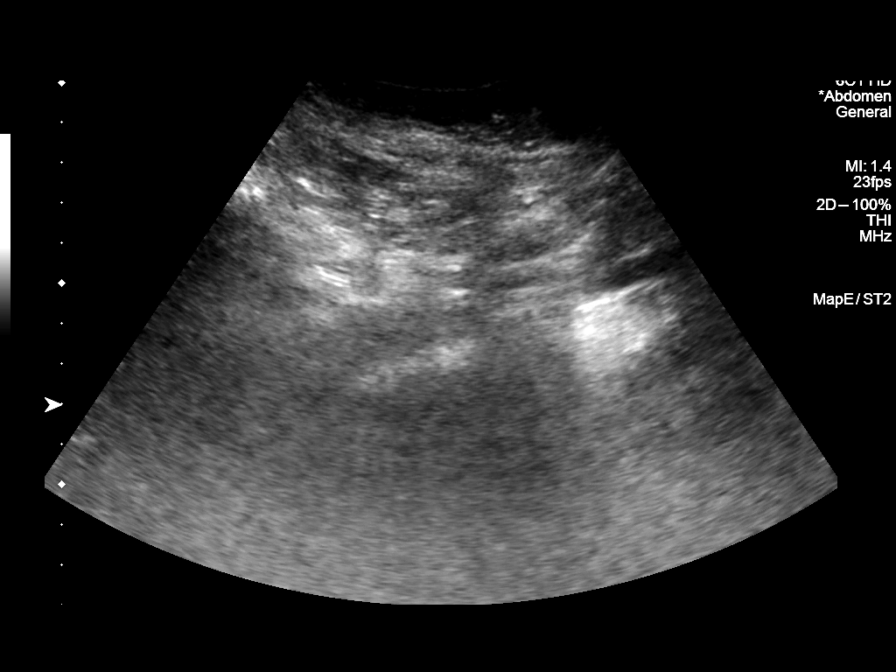

[9 of 9 positions shown; findings below may reference images not displayed]

Initial ultrasound scanning demonstrates a large amount of ascites
within the right lower abdominal quadrant. The right lower abdomen
was prepped and draped in the usual sterile fashion. 1% lidocaine
with epinephrine was used for local anesthesia. Under direct
ultrasound guidance, a 19 gauge, 7-cm, Yueh catheter was introduced.
An ultrasound image was saved for documentation purposed. The
paracentesis was performed. The catheter was removed and a dressing
was applied. The patient tolerated the procedure well without
immediate post procedural complication.
FINDINGS: A total of approximately 2.7 liters of serous fluid was removed.
IMPRESSION: Successful ultrasound-guided paracentesis yielding 2.7 liters of
peritoneal fluid.

Read by:  Lonnell Jeppson

## 2018-07-02 MED ORDER — LACTULOSE 10 GM/15ML PO SOLN
10.0000 g | Freq: Every day | ORAL | Status: DC
  Filled 2018-07-02 (×3): qty 15

## 2018-07-02 MED ORDER — ALUM & MAG HYDROXIDE-SIMETH 200-200-20 MG/5ML PO SUSP: 30.0000 mL | ORAL | Status: DC | PRN

## 2018-07-02 MED ORDER — OXYCODONE HCL 5 MG PO TABS
5.0000 mg | ORAL_TABLET | ORAL | Status: DC | PRN
  Administered 2018-07-02 – 2018-07-03 (×3): 5 mg via ORAL
  Filled 2018-07-02 (×3): qty 1

## 2018-07-02 MED ORDER — LIDOCAINE HCL 1 % IJ SOLN
INTRAMUSCULAR | Status: AC
  Filled 2018-07-02: qty 10

## 2018-07-02 NOTE — TOC Initial Note (Signed)
Transition of Care [facility](TOC) - Initial/Assessment Note    Patient Details  Name: Anne Black MRN: [id number] Date of Birth: Oct 14, 1960  Transition of Care [facility](TOC) CM/SW Contact:    Gustavus BryantBrooke L Kilah Drahos, LCSW Phone Number: 07/02/2018, 12:24 PM  Clinical Narrative:          PT is recommending SNF Supervision or Assistance 24 hours. Patient is open to SNF placement now. PASRR already receivied. CSW went to patient's room but patient still sleeping. CSW completed new FL2 but did not fax out yet. CSW spoke with RN and discussed plan of care. Patient's main barrier is that she does not have insurance coverage for SNF placement. CSW will continue to follow for future needs.    Expected Discharge Plan: Skilled Nursing Facility Barriers to Discharge: Inadequate or no insurance   Patient Goals and CMS Choice Patient states their goals for this hospitalization and ongoing recovery are:: To get better      Expected Discharge Plan and Services Expected Discharge Plan: Skilled Nursing Facility     Activities of Daily Living Home Assistive Devices/Equipment: None ADL Screening (condition at time of admission) Patient's cognitive ability adequate to safely complete daily activities?: Yes Is the patient deaf or have difficulty hearing?: No Does the patient have difficulty seeing, even when wearing glasses/contacts?: No Does the patient have difficulty concentrating, remembering, or making decisions?: Yes Patient able to express need for assistance with ADLs?: Yes Does the patient have difficulty dressing or bathing?: No Independently performs ADLs?: Yes (appropriate for developmental age) Does the patient have difficulty walking or climbing stairs?: No Weakness of Legs: None Weakness of Arms/Hands: None  Alcohol / Substance Use: Alcohol Use    Admission diagnosis:  Peripheral edema [R60.9] Cellulitis of lower extremity, unspecified laterality [L03.119] Patient Active Problem List   Diagnosis Date  Noted  . Cellulitis of lower extremity   . Peripheral edema   . Atrial fibrillation with RVR (HCC) 06/13/2018  . Alcohol dependence (HCC) 05/06/2018  . Electrolyte disturbance 05/05/2018  . Alcohol withdrawal delirium (HCC) 04/28/2018  . Weakness generalized 04/27/2018  . Gait abnormality 04/24/2018  . Chronic hypoxemic respiratory failure (HCC) 04/11/2018  . Atrial fibrillation with rapid ventricular response (HCC) 03/23/2018  . Head injury   . Fall 03/19/2018  . Syncope 03/19/2018  . Orbital fracture 03/19/2018  . Scalp laceration 03/19/2018  . Acute respiratory failure with hypoxia and hypercarbia (HCC) 03/19/2018  . Abdominal tenderness 03/19/2018  . Acute on chronic respiratory failure with hypoxia (HCC) 03/17/2018  . Alcohol abuse with intoxication (HCC) 03/17/2018  . ETOH abuse   . Normocytic anemia 03/16/2018  . Acute metabolic encephalopathy 03/16/2018  . Lower extremity edema 03/16/2018  . Migraine 03/16/2018  . Community acquired pneumonia of left lower lobe of lung (HCC)   . Weakness 08/21/2017  . COPD exacerbation (HCC) 06/12/2017  . Chest pain with moderate risk for cardiac etiology 05/30/2017  . Abnormal LFTs 05/30/2017  . Tachycardia 05/30/2017  . COPD (chronic obstructive pulmonary disease) (HCC) 04/19/2017  . Right rib fracture 04/15/2017  . Abdominal wall cellulitis 12/30/2016  . S/P exploratory laparotomy 12/30/2016  . Distal radius fracture, left 09/09/2016  . Thrombocytopenia (HCC) 09/09/2016  . Chest x-ray abnormality   . Umbilical hernia without obstruction and without gangrene 05/12/2016  . Itching 02/24/2016  . Ventral hernia without obstruction or gangrene 02/24/2016  . Palliative care encounter   . Ascites   . Tachypnea   . Atrial flutter with rapid ventricular response (HCC)   .  Hypomagnesemia   . Hypoxia   . Dehydration 01/22/2016  . Low back ache 12/30/2015  . Non-intractable vomiting with nausea   . Alcohol abuse with alcohol-induced  mood disorder (HCC) 12/15/2015  . Acute alcoholism (HCC)   . Distended abdomen 11/04/2015  . Alcohol abuse   . Dyspnea   . Acute respiratory failure (HCC) 10/15/2015  . Ascites due to alcoholic cirrhosis (HCC) 10/15/2015  . Osteoporosis   . Multiple sclerosis (HCC)   . Alcoholic cirrhosis of liver with ascites (HCC) 08/22/2015  . Bipolar disorder, current episode mixed, moderate (HCC) 08/01/2015  . Alcohol use disorder, severe, dependence (HCC) 07/31/2015  . Macrocytic anemia- due to alcohol abuse with normal B12 & folate levels 05/31/2015  . Severe protein-calorie malnutrition (HCC) 05/31/2015  . Hypokalemia 05/26/2015  . Encephalopathy, hepatic (HCC) 05/26/2015  . Alcohol withdrawal (HCC) 05/18/2015  . Abdominal pain 05/18/2015  . Anxiety 05/18/2015  . Stimulant abuse (HCC) 12/27/2014  . Nicotine abuse 12/27/2014  . Hyperprolactinemia (HCC) 11/15/2014  . Dyslipidemia   . Alcohol use disorder, moderate, dependence (HCC) 10/30/2014  . Tobacco abuse 01/23/2014  . Bipolar 1 disorder (HCC) 04/07/2013  . Noncompliance with therapeutic plan 04/04/2013  . Hereditary and idiopathic peripheral neuropathy 05/01/2012  . Depression 05/01/2012  . GERD (gastroesophageal reflux disease) 05/01/2012  . Osteoarthrosis, unspecified whether generalized or localized, involving lower leg 05/01/2012  . Hyponatremia 07/01/2011   PCP:  Patient, No Pcp Per Pharmacy:   [id number] - [place], [place] - [address] [place] [address] Phone: [phone] Fax: [phone]  Readmission Risk Interventions No flowsheet data found.  Dickie La, BSW, MSW, [email or web address] Phone: [phone]

## 2018-07-02 NOTE — Procedures (Signed)
PROCEDURE SUMMARY:  Successful image-guided paracentesis from the right lower abdomen.  Yielded 5.7 liters of clear gold fluid.  No immediate complications.  EBL = 5 mL. Patient tolerated well.   Specimen was not sent for labs.  Gordy Councilman Gared Gillie PA-C 07/02/2018 2:15 PM

## 2018-07-02 NOTE — [place] FL2 (Signed)
[place] MEDICAID FL2 LEVEL OF CARE SCREENING TOOL     IDENTIFICATION  Patient Name: Anne ApplebaumCarla S Amrhein Birthdate: Nov 19, 1960 Sex: female Admission Date (Current Location): 06/30/2018  [facility] [place]   Facility and Address:  [facility],  [address], [place] [id number]      Provider Number: [id number]  Attending Physician Name and Address:  Alwyn RenMathews, Elizabeth G, MD  Relative Name and Phone Number:       Current Level of Care: Hospital Recommended Level of Care: Skilled Nursing Facility Prior Approval Number:    Date Approved/Denied:   PASRR Number: [id number]  Discharge Plan: SNF    Current Diagnoses: Patient Active Problem List   Diagnosis Date Noted  . Cellulitis of lower extremity   . Peripheral edema   . Atrial fibrillation with RVR (HCC) 06/13/2018  . Alcohol dependence (HCC) 05/06/2018  . Electrolyte disturbance 05/05/2018  . Alcohol withdrawal delirium (HCC) 04/28/2018  . Weakness generalized 04/27/2018  . Gait abnormality 04/24/2018  . Chronic hypoxemic respiratory failure (HCC) 04/11/2018  . Atrial fibrillation with rapid ventricular response (HCC) 03/23/2018  . Head injury   . Fall 03/19/2018  . Syncope 03/19/2018  . Orbital fracture 03/19/2018  . Scalp laceration 03/19/2018  . Acute respiratory failure with hypoxia and hypercarbia (HCC) 03/19/2018  . Abdominal tenderness 03/19/2018  . Acute on chronic respiratory failure with hypoxia (HCC) 03/17/2018  . Alcohol abuse with intoxication (HCC) 03/17/2018  . ETOH abuse   . Normocytic anemia 03/16/2018  . Acute metabolic encephalopathy 03/16/2018  . Lower extremity edema 03/16/2018  . Migraine 03/16/2018  . Community acquired pneumonia of left lower lobe of lung (HCC)   . Weakness 08/21/2017  . COPD exacerbation (HCC) 06/12/2017  . Chest pain with moderate risk for cardiac etiology 05/30/2017  . Abnormal LFTs 05/30/2017  . Tachycardia 05/30/2017  . COPD (chronic  obstructive pulmonary disease) (HCC) 04/19/2017  . Right rib fracture 04/15/2017  . Abdominal wall cellulitis 12/30/2016  . S/P exploratory laparotomy 12/30/2016  . Distal radius fracture, left 09/09/2016  . Thrombocytopenia (HCC) 09/09/2016  . Chest x-ray abnormality   . Umbilical hernia without obstruction and without gangrene 05/12/2016  . Itching 02/24/2016  . Ventral hernia without obstruction or gangrene 02/24/2016  . Palliative care encounter   . Ascites   . Tachypnea   . Atrial flutter with rapid ventricular response (HCC)   . Hypomagnesemia   . Hypoxia   . Dehydration 01/22/2016  . Low back ache 12/30/2015  . Non-intractable vomiting with nausea   . Alcohol abuse with alcohol-induced mood disorder (HCC) 12/15/2015  . Acute alcoholism (HCC)   . Distended abdomen 11/04/2015  . Alcohol abuse   . Dyspnea   . Acute respiratory failure (HCC) 10/15/2015  . Ascites due to alcoholic cirrhosis (HCC) 10/15/2015  . Osteoporosis   . Multiple sclerosis (HCC)   . Alcoholic cirrhosis of liver with ascites (HCC) 08/22/2015  . Bipolar disorder, current episode mixed, moderate (HCC) 08/01/2015  . Alcohol use disorder, severe, dependence (HCC) 07/31/2015  . Macrocytic anemia- due to alcohol abuse with normal B12 & folate levels 05/31/2015  . Severe protein-calorie malnutrition (HCC) 05/31/2015  . Hypokalemia 05/26/2015  . Encephalopathy, hepatic (HCC) 05/26/2015  . Alcohol withdrawal (HCC) 05/18/2015  . Abdominal pain 05/18/2015  . Anxiety 05/18/2015  . Stimulant abuse (HCC) 12/27/2014  . Nicotine abuse 12/27/2014  . Hyperprolactinemia (HCC) 11/15/2014  . Dyslipidemia   . Alcohol use disorder, moderate, dependence (HCC) 10/30/2014  .  Tobacco abuse 01/23/2014  . Bipolar 1 disorder (HCC) 04/07/2013  . Noncompliance with therapeutic plan 04/04/2013  . Hereditary and idiopathic peripheral neuropathy 05/01/2012  . Depression 05/01/2012  . GERD (gastroesophageal reflux disease)  05/01/2012  . Osteoarthrosis, unspecified whether generalized or localized, involving lower leg 05/01/2012  . Hyponatremia 07/01/2011    Orientation RESPIRATION BLADDER Height & Weight     Self, Place  O2(2lpm Chronic O2) Incontinent Weight: 191 lb 2.2 oz (86.7 kg) Height:  5\' 2"  (157.5 cm)  BEHAVIORAL SYMPTOMS/MOOD NEUROLOGICAL BOWEL NUTRITION STATUS      Continent Diet(Regular)  AMBULATORY STATUS COMMUNICATION OF NEEDS Skin     Verbally Bruising(BILATERAL ARMS, LEGS AND HANDS)                       Personal Care Assistance Level of Assistance  Bathing, Feeding, Dressing Bathing Assistance: Maximum assistance Feeding assistance: Independent Dressing Assistance: Maximum assistance     Functional Limitations Info  Sight, Hearing, Speech Sight Info: Adequate Hearing Info: Adequate Speech Info: Adequate    SPECIAL CARE FACTORS FREQUENCY  PT (By licensed PT), OT (By licensed OT)     PT Frequency: 5 days/week OT Frequency: 5 days/week            Contractures Contractures Info: Not present    Additional Factors Info  Code Status, Allergies, Psychotropic Code Status Info: full Allergies Info: no known allergies Psychotropic Info: librium         Current Medications (07/02/2018):  This is the current hospital active medication list Current Facility-Administered Medications  Medication Dose Route Frequency Provider Last Rate Last Dose  . 0.9 %  sodium chloride infusion  250 mL Intravenous PRN Wouk, Wilfred Curtis, MD   Stopped at 07/02/18 0532  . acetaminophen (TYLENOL) tablet 650 mg  650 mg Oral Q6H PRN Macon Large, NP   650 mg at 07/01/18 2220  . clindamycin (CLEOCIN) IVPB 600 mg  600 mg Intravenous Q8H Alwyn Ren, MD 100 mL/hr at 07/02/18 0532 600 mg at 07/02/18 0532  . diltiazem (CARDIZEM CD) 24 hr capsule 120 mg  120 mg Oral Daily Kathrynn Running, MD   120 mg at 07/02/18 1572  . diphenhydrAMINE (BENADRYL) capsule 25 mg  25 mg Oral Q6H  PRN Alwyn Ren, MD   25 mg at 07/02/18 6203  . enoxaparin (LOVENOX) injection 40 mg  40 mg Subcutaneous QHS Kathrynn Running, MD   40 mg at 07/01/18 2223  . folic acid (FOLVITE) tablet 1 mg  1 mg Oral Daily Wouk, Wilfred Curtis, MD   1 mg at 07/02/18 5597  . furosemide (LASIX) tablet 40 mg  40 mg Oral Daily Kathrynn Running, MD   40 mg at 07/02/18 4163  . gabapentin (NEURONTIN) capsule 200 mg  200 mg Oral TID Kathrynn Running, MD   200 mg at 07/02/18 8453  . hydrOXYzine (ATARAX/VISTARIL) tablet 10 mg  10 mg Oral BID Alwyn Ren, MD   10 mg at 07/02/18 0820  . lactulose (CHRONULAC) 10 GM/15ML solution 10 g  10 g Oral Daily Alwyn Ren, MD      . LORazepam (ATIVAN) tablet 1 mg  1 mg Oral Q6H PRN Kathrynn Running, MD   1 mg at 07/02/18 0820   Or  . LORazepam (ATIVAN) injection 1 mg  1 mg Intravenous Q6H PRN Wouk, Wilfred Curtis, MD      . magnesium oxide (MAG-OX) tablet 400 mg  400  mg Oral Daily Kathrynn Running, MD   400 mg at 07/02/18 7544  . multivitamin with minerals tablet 1 tablet  1 tablet Oral Daily Kathrynn Running, MD   1 tablet at 07/02/18 [phone]  . nicotine (NICODERM CQ - dosed in mg/24 hours) patch 21 mg  21 mg Transdermal Daily Kathrynn Running, MD   21 mg at 07/02/18 0936  . ondansetron (ZOFRAN) tablet 4 mg  4 mg Oral Q8H PRN Kathrynn Running, MD   4 mg at 07/01/18 0071  . oxyCODONE (Oxy IR/ROXICODONE) immediate release tablet 5 mg  5 mg Oral Q4H PRN Alwyn Ren, MD      . pantoprazole (PROTONIX) EC tablet 40 mg  40 mg Oral Daily Kathrynn Running, MD   40 mg at 07/02/18 2197  . potassium chloride SA (K-DUR) CR tablet 40 mEq  40 mEq Oral BID Kathrynn Running, MD   40 mEq at 07/02/18 5883  . sodium chloride flush (NS) 0.9 % injection 3 mL  3 mL Intravenous Q12H Wouk, Wilfred Curtis, MD   3 mL at 07/02/18 0939  . sodium chloride flush (NS) 0.9 % injection 3 mL  3 mL Intravenous PRN Wouk, Wilfred Curtis, MD      . spironolactone (ALDACTONE)  tablet 25 mg  25 mg Oral Daily Kathrynn Running, MD   25 mg at 07/02/18 [phone]  . thiamine (VITAMIN B-1) tablet 100 mg  100 mg Oral Daily Kathrynn Running, MD   100 mg at 07/02/18 8264   Or  . thiamine (B-1) injection 100 mg  100 mg Intravenous Daily Wouk, Wilfred Curtis, MD      . vancomycin (VANCOCIN) IVPB 750 mg/150 ml premix  750 mg Intravenous Q12H Herby Abraham, [facility]   Stopped at 07/02/18 1583     Discharge Medications: Please see discharge summary for a list of discharge medications.  Relevant Imaging Results:  Relevant Lab Results:   Additional Information SSN:  [id number]  Gustavus Bryant, LCSW

## 2018-07-02 NOTE — Progress Notes (Signed)
PROGRESS NOTE    Anne Black  WUG:[id number] DOB: 1960-08-14 DOA: 06/30/2018 PCP: Patient, No Pcp Per  Brief Narrative 58 y.o. female with medical history significant for morbid obesity, alcoholic cirrhosis with varices and chronic ascites, copd, bipolar disorder, paroxysmal a-fib not anticoagulated, smoking, and chronic respiratory failure on home o2, presenting w/ the above.  Patient somnolent but arousable. Appears upset and only answers with short phrases. When asked why she is here, she says she doesn't know. When told that she reported leg swelling and pain, she agrees that yes, that's why she came.  She say she has chronic lower extremity swelling but says it has worsened and is not more painful than normal. Also more red than normal. Bilateral. No fevers. Says she is compliant with her home medications. Was recently admitted, discharged 5/12, treated for a fib w/ rvr and therapeutic paracentesis of 6 L performed. Extensive frequent admission history.  She denies cough or shortness of breath changed from baseline. She denies chest pain. She denies abdominal pain or nausea/vomiting. She denies history of dvt/pe.  ED Course: labs, clindomycin, lasix, albuterol  Assessment & Plan:   Active Problems:   Bipolar 1 disorder (HCC)   Tobacco abuse   Alcohol use disorder, moderate, dependence (HCC)   Nicotine abuse   Hypokalemia   Alcoholic cirrhosis of liver with ascites (HCC)   Multiple sclerosis (HCC)   Lower extremity edema   Chronic hypoxemic respiratory failure (HCC)    #1 bilateral lower extremity cellulitis exacerbated by increasing lower extremity edema patient has chronic lower extremity edema secondary to advanced cirrhosis and venous stasis.  Patient is on vancomycin.  Patient continues to complain of increased pain and itching in the whole body and the lower extremities.  Her erythema in both lower extremities have come down compared to yesterday but still remains  erythematous and edematous.  Her d-dimer was elevated at 3.33 I will order a venous Doppler of both lower extremities.  Continue Lasix and spironolactone.  #2 cirrhosis of the liver with ongoing alcohol abuse abdomen seems more distended today in spite of diuretics.  Will consult IR for ultrasound-guided paracentesis.  She appears more short of breath today.  I do not see lactulose in her home list of medications I will add lactulose daily.  #3 normocytic anemia of chronic disease  #4 chronic hypoxic respiratory failure patient is on 2 L of oxygen at home at all times.  She continues to smoke.  #5 alcohol abuse continue CIWA protocol no tremors    This is a complex 58 year old female with end-stage cirrhosis and O2 dependent COPD with hypoxia admitted with bilateral lower extremity cellulitis on IV antibiotics.  Patient needs continuous monitoring for cellulitis and needs paracentesis done to relieve her breathing difficulty.  Patient appears tachypneic and restless and volume overloaded today in spite of Lasix and spironolactone being given.  Patient needs a therapeutic paracentesis to relieve her hypoxia and shortness of breath.  This patient needs continued inpatient monitoring.  Without which she will go into sepsis and respiratory failure.   DVT prophylaxis: lovenox Code Status: full  Family Communication: none (patient says has no contacts)  Disposition Plan: tbd  Consults called:  Consult radiology for paracentesis   Estimated body mass index is 34.96 kg/m as calculated from the following:   Height as of this encounter: 5\' 2"  (1.575 m).   Weight as of this encounter: 86.7 kg.   Subjective: Patient resting in bed has multiple complaints complains of  itching all over complains of burning sensation in lower extremities appears to be short of breath and tachypneic abdomen seems distended than yesterday patient reports having a bowel movement  Objective: Vitals:   07/01/18 1930  07/01/18 2313 07/02/18 0621 07/02/18 0900  BP:  (!) 100/57 107/69   Pulse:  78 82   Resp:  18 18   Temp:  97.8 F (36.6 C) 98.1 F (36.7 C)   TempSrc:  Oral Oral   SpO2:  100% 92% (!) 84%  Weight: 86.7 kg     Height: 5\' 2"  (1.575 m)       Intake/Output Summary (Last 24 hours) at 07/02/2018 1047 Last data filed at 07/02/2018 0920 Gross per 24 hour  Intake 2853.11 ml  Output 1800 ml  Net 1053.11 ml   Filed Weights   07/01/18 1930  Weight: 86.7 kg    Examination:  General exam: Appears calm and comfortable  Respiratory system: Clear to auscultation. Respiratory effort normal. Cardiovascular system: S1 & S2 heard, RRR. No JVD, murmurs, rubs, gallops or clicks. No pedal edema. Gastrointestinal system: Abdomen is nondistended, soft and nontender. No organomegaly or masses felt. Normal bowel sounds heard. Central nervous system: Alert and oriented. No focal neurological deficits. Extremities: Symmetric 5 x 5 power. Skin: No rashes, lesions or ulcers Psychiatry: Judgement and insight appear normal. Mood & affect appropriate.     Data Reviewed: I have personally reviewed following labs and imaging studies  CBC: Recent Labs  Lab 06/30/18 1728 07/01/18 0359  WBC 3.5* 3.5*  NEUTROABS 1.9  --   HGB 8.9* 8.4*  HCT 30.6* 28.6*  MCV 82.9 82.9  PLT 143* 131*   Basic Metabolic Panel: Recent Labs  Lab 06/30/18 1728 07/01/18 0359 07/02/18 0417  NA 139 138 135  K 3.1* 2.8* 3.6  CL 101 98 98  CO2 27 32 31  GLUCOSE 92 104* 134*  BUN <5* <5* <5*  CREATININE 0.42* 0.40* 0.41*  CALCIUM 7.9* 7.4* 7.6*  MG  --  1.7  --    GFR: Estimated Creatinine Clearance: 78.3 mL/min (A) (by C-G formula based on SCr of 0.41 mg/dL (L)). Liver Function Tests: Recent Labs  Lab 06/30/18 1728 07/02/18 0417  AST 33 31  ALT 16 14  ALKPHOS 104 91  BILITOT 1.7* 2.0*  PROT 6.2* 5.6*  ALBUMIN 2.5* 2.2*   No results for input(s): LIPASE, AMYLASE in the last 168 hours. Recent Labs  Lab  07/02/18 0417  AMMONIA 99*   Coagulation Profile: No results for input(s): INR, PROTIME in the last 168 hours. Cardiac Enzymes: No results for input(s): CKTOTAL, CKMB, CKMBINDEX, TROPONINI in the last 168 hours. BNP (last 3 results) No results for input(s): PROBNP in the last 8760 hours. HbA1C: No results for input(s): HGBA1C in the last 72 hours. CBG: No results for input(s): GLUCAP in the last 168 hours. Lipid Profile: No results for input(s): CHOL, HDL, LDLCALC, TRIG, CHOLHDL, LDLDIRECT in the last 72 hours. Thyroid Function Tests: No results for input(s): TSH, T4TOTAL, FREET4, T3FREE, THYROIDAB in the last 72 hours. Anemia Panel: No results for input(s): VITAMINB12, FOLATE, FERRITIN, TIBC, IRON, RETICCTPCT in the last 72 hours. Sepsis Labs: Recent Labs  Lab 06/30/18 1729  LATICACIDVEN 1.9    Recent Results (from the past 240 hour(s))  SARS Coronavirus 2 (CEPHEID - Performed in [facility] hospital lab), Hosp Order     Status: None   Collection Time: 06/25/18  5:25 AM  Result Value Ref Range Status  SARS Coronavirus 2 NEGATIVE NEGATIVE Final    Comment: (NOTE) If result is NEGATIVE SARS-CoV-2 target nucleic acids are NOT DETECTED. The SARS-CoV-2 RNA is generally detectable in upper and lower  respiratory specimens during the acute phase of infection. The lowest  concentration of SARS-CoV-2 viral copies this assay can detect is 250  copies / mL. A negative result does not preclude SARS-CoV-2 infection  and should not be used as the sole basis for treatment or other  patient management decisions.  A negative result may occur with  improper specimen collection / handling, submission of specimen other  than nasopharyngeal swab, presence of viral mutation(s) within the  areas targeted by this assay, and inadequate number of viral copies  (<250 copies / mL). A negative result must be combined with clinical  observations, patient history, and epidemiological information. If  result is POSITIVE SARS-CoV-2 target nucleic acids are DETECTED. The SARS-CoV-2 RNA is generally detectable in upper and lower  respiratory specimens dur ing the acute phase of infection.  Positive  results are indicative of active infection with SARS-CoV-2.  Clinical  correlation with patient history and other diagnostic information is  necessary to determine patient infection status.  Positive results do  not rule out bacterial infection or co-infection with other viruses. If result is PRESUMPTIVE POSTIVE SARS-CoV-2 nucleic acids MAY BE PRESENT.   A presumptive positive result was obtained on the submitted specimen  and confirmed on repeat testing.  While 2019 novel coronavirus  (SARS-CoV-2) nucleic acids may be present in the submitted sample  additional confirmatory testing may be necessary for epidemiological  and / or clinical management purposes  to differentiate between  SARS-CoV-2 and other Sarbecovirus currently known to infect humans.  If clinically indicated additional testing with an alternate test  methodology [id number] is advised. The SARS-CoV-2 RNA is generally  detectable in upper and lower respiratory sp ecimens during the acute  phase of infection. The expected result is Negative. Fact Sheet for Patients:  BoilerBrush.com.cy Fact Sheet for Healthcare Providers: https://pope.com/ This test is not yet approved or cleared by the [place] FDA and has been authorized for detection and/or diagnosis of SARS-CoV-2 by FDA under an Emergency Use Authorization (EUA).  This EUA will remain in effect (meaning this test can be used) for the duration of the COVID-19 declaration under Section 564(b)(1) of the Act, 21 U.S.C. section 360bbb-3(b)(1), unless the authorization is terminated or revoked sooner. Performed at [facility], [address] [place], [place] [address]   SARS Coronavirus 2 (CEPHEID -  Performed in [facility] hospital lab), Hosp Order     Status: None   Collection Time: 06/30/18  7:49 PM  Result Value Ref Range Status   SARS Coronavirus 2 NEGATIVE NEGATIVE Final    Comment: (NOTE) If result is NEGATIVE SARS-CoV-2 target nucleic acids are NOT DETECTED. The SARS-CoV-2 RNA is generally detectable in upper and lower  respiratory specimens during the acute phase of infection. The lowest  concentration of SARS-CoV-2 viral copies this assay can detect is 250  copies / mL. A negative result does not preclude SARS-CoV-2 infection  and should not be used as the sole basis for treatment or other  patient management decisions.  A negative result may occur with  improper specimen collection / handling, submission of specimen other  than nasopharyngeal swab, presence of viral mutation(s) within the  areas targeted by this assay, and inadequate number of viral copies  (<250 copies / mL). A negative result  must be combined with clinical  observations, patient history, and epidemiological information. If result is POSITIVE SARS-CoV-2 target nucleic acids are DETECTED. The SARS-CoV-2 RNA is generally detectable in upper and lower  respiratory specimens dur ing the acute phase of infection.  Positive  results are indicative of active infection with SARS-CoV-2.  Clinical  correlation with patient history and other diagnostic information is  necessary to determine patient infection status.  Positive results do  not rule out bacterial infection or co-infection with other viruses. If result is PRESUMPTIVE POSTIVE SARS-CoV-2 nucleic acids MAY BE PRESENT.   A presumptive positive result was obtained on the submitted specimen  and confirmed on repeat testing.  While 2019 novel coronavirus  (SARS-CoV-2) nucleic acids may be present in the submitted sample  additional confirmatory testing may be necessary for epidemiological  and / or clinical management purposes  to differentiate between   SARS-CoV-2 and other Sarbecovirus currently known to infect humans.  If clinically indicated additional testing with an alternate test  methodology [phone]) is advised. The SARS-CoV-2 RNA is generally  detectable in upper and lower respiratory sp ecimens during the acute  phase of infection. The expected result is Negative. Fact Sheet for Patients:  BoilerBrush.com.cy Fact Sheet for Healthcare Providers: https://pope.com/ This test is not yet approved or cleared by the [place] FDA and has been authorized for detection and/or diagnosis of SARS-CoV-2 by FDA under an Emergency Use Authorization (EUA).  This EUA will remain in effect (meaning this test can be used) for the duration of the COVID-19 declaration under Section 564(b)(1) of the Act, 21 U.S.C. section 360bbb-3(b)(1), unless the authorization is terminated or revoked sooner. Performed at [facility], [address], [place], [place] [address]          Radiology Studies: Dg Chest 1 View  Result Date: 06/30/2018 CLINICAL DATA:  Shortness of breath EXAM: CHEST  1 VIEW COMPARISON:  05/30/2018 FINDINGS: The heart size and mediastinal contours are within normal limits. Both lungs are clear. The visualized skeletal structures are unremarkable. IMPRESSION: No active disease. Electronically Signed   By: Deatra Robinson M.D.   On: 06/30/2018 18:19        Scheduled Meds: . diltiazem  120 mg Oral Daily  . enoxaparin (LOVENOX) injection  40 mg Subcutaneous QHS  . folic acid  1 mg Oral Daily  . furosemide  40 mg Oral Daily  . gabapentin  200 mg Oral TID  . hydrOXYzine  10 mg Oral BID  . magnesium oxide  400 mg Oral Daily  . multivitamin with minerals  1 tablet Oral Daily  . nicotine  21 mg Transdermal Daily  . pantoprazole  40 mg Oral Daily  . potassium chloride SA  40 mEq Oral BID  . sodium chloride flush  3 mL Intravenous Q12H  . spironolactone  25 mg  Oral Daily  . thiamine  100 mg Oral Daily   Or  . thiamine  100 mg Intravenous Daily   Continuous Infusions: . sodium chloride Stopped (07/02/18 0532)  . clindamycin (CLEOCIN) IV 600 mg (07/02/18 0532)  . vancomycin Stopped (07/02/18 0232)     LOS: 1 day     Alwyn Ren, MD [facility]  If 7PM-7AM, please contact night-coverage [email or web address] Password [email or web address] 07/02/2018, 10:47 AM

## 2018-07-03 ENCOUNTER — Inpatient Hospital Stay (HOSPITAL_COMMUNITY): Payer: Medicare Other

## 2018-07-03 DIAGNOSIS — R609 Edema, unspecified: Secondary | ICD-10-CM

## 2018-07-03 LAB — COMPREHENSIVE METABOLIC PANEL
ALT: 13 U/L (ref 0–44)
AST: 23 U/L (ref 15–41)
Albumin: 2.1 g/dL — ABNORMAL LOW (ref 3.5–5.0)
Alkaline Phosphatase: 83 U/L (ref 38–126)
Anion gap: 5 (ref 5–15)
BUN: <5 mg/dL — ABNORMAL LOW (ref 6–20)
CO2: 32 mmol/L (ref 22–32)
Calcium: 7.8 mg/dL — ABNORMAL LOW (ref 8.9–10.3)
Chloride: 96 mmol/L — ABNORMAL LOW (ref 98–111)
Creatinine, Ser: 0.39 mg/dL — ABNORMAL LOW (ref 0.44–1.00)
GFR calc Af Amer: >60 mL/min (ref >60)
GFR calc non Af Amer: >60 mL/min (ref >60)
Glucose, Bld: 120 mg/dL — ABNORMAL HIGH (ref 70–99)
Potassium: 3.8 mmol/L (ref 3.5–5.1)
Sodium: 133 mmol/L — ABNORMAL LOW (ref 135–145)
Total Bilirubin: 1.5 mg/dL — ABNORMAL HIGH (ref 0.3–1.2)
Total Protein: 5.3 g/dL — ABNORMAL LOW (ref 6.5–8.1)

## 2018-07-03 LAB — CBC
HCT: 29.6 % — ABNORMAL LOW (ref 36.0–46.0)
Hemoglobin: 8.4 g/dL — ABNORMAL LOW (ref 12.0–15.0)
MCH: 24.1 pg — ABNORMAL LOW (ref 26.0–34.0)
MCHC: 28.4 g/dL — ABNORMAL LOW (ref 30.0–36.0)
MCV: 84.8 fL (ref 80.0–100.0)
Platelets: 97 10*3/uL — ABNORMAL LOW (ref 150–400)
RBC: 3.49 MIL/uL — ABNORMAL LOW (ref 3.87–5.11)
RDW: 21.9 % — ABNORMAL HIGH (ref 11.5–15.5)
WBC: 3.1 10*3/uL — ABNORMAL LOW (ref 4.0–10.5)
nRBC: 0 % (ref 0.0–0.2)

## 2018-07-03 MED ORDER — HYDROXYZINE HCL 10 MG PO TABS
10.0000 mg | ORAL_TABLET | Freq: Three times a day (TID) | ORAL | Status: DC
  Administered 2018-07-03 (×2): 10 mg via ORAL
  Filled 2018-07-03 (×4): qty 1

## 2018-07-03 MED ORDER — CALAMINE EX LOTN
TOPICAL_LOTION | Freq: Four times a day (QID) | CUTANEOUS | Status: DC
  Administered 2018-07-03 (×4): via TOPICAL
  Filled 2018-07-03: qty 118

## 2018-07-03 NOTE — Progress Notes (Signed)
PROGRESS NOTE    Anne Black  EVO:[id number] DOB: 1960/11/12 DOA: 06/30/2018 PCP: Patient, No Pcp Per  Brief Narrative: 58 y.o.femalewith medical history significant formorbid obesity, alcoholic cirrhosis with varices and chronic ascites, copd, bipolar disorder, paroxysmal a-fib not anticoagulated, smoking, and chronic respiratory failure on home o2, presenting w/ the above.  Patient somnolent but arousable. Appears upset and only answers with short phrases. When asked why she is here, she says she doesn't know. When told that she reported leg swelling and pain, she agrees that yes, that's why she came.  She say she has chronic lower extremity swelling but says it has worsened and is not more painful than normal. Also more red than normal. Bilateral. No fevers. Says she is compliant with her home medications. Was recently admitted, discharged 5/12, treated for a fib w/ rvr and therapeutic paracentesis of 6 L performed. Extensive frequent admission history.  She denies cough or shortness of breath changed from baseline. She denies chest pain. She denies abdominal pain or nausea/vomiting. She denies history of dvt/pe.  ED Course:labs, clindomycin, lasix, albuterol  Assessment & Plan:   Active Problems:   Bipolar 1 disorder (HCC)   Tobacco abuse   Alcohol use disorder, moderate, dependence (HCC)   Nicotine abuse   Hypokalemia   Alcoholic cirrhosis of liver with ascites (HCC)   Multiple sclerosis (HCC)   Distended abdomen   Lower extremity edema   Chronic hypoxemic respiratory failure (HCC)   Cellulitis of lower extremity   Peripheral edema   #1 bilateral lower extremity cellulitis exacerbated by increasing lower extremity edema patient has chronic lower extremity edema secondary to advanced cirrhosis and venous stasis.  Patient is on vancomycin and clindamycin.  DC vancomycin and continue clindamycin.  MRSA PCR negative. Patient continues to complain of increased pain and  itching in the whole body and the lower extremities.  Her erythema in both lower extremities have come down compared to yesterday but still remains erythematous and edematous.  Her d-dimer was elevated at 3.33 venous Doppler of the lower extremities pending. Continue Lasix and spironolactone.  #2 cirrhosis of the liver with ongoing alcohol abuse abdomen seems more distended today in spite of diuretics.  Status post paracentesis 5.7 golden color fluid removed yesterday. she refused to take lactulose.  Advised her to start taking lactulose again.  #3 normocytic anemia of chronic disease  #4 chronic hypoxic respiratory failure patient is on 2 L of oxygen at home at all times.  She continues to smoke.  #5 alcohol abuse continue CIWA protocol no tremors   This patient continues to require inpatient hospital stay due end-stage cirrhosis and O2 dependent COPD with bilateral lower extremity cellulitis and IV antibiotics.  Patient is status post 5.7 L paracentesis yesterday.  If she remains stable and afebrile overnight we will plan to discharge to SNF tomorrow.  Estimated body mass index is 34.96 kg/m as calculated from the following:   Height as of this encounter: 5\' 2"  (1.575 m).   Weight as of this encounter: 86.7 kg.   Subjective:  Complains of itching all over does not want to take lactulose feels breathing is better after 5.7 L removed from belly yesterday. Objective: Vitals:   07/02/18 1417 07/02/18 1439 07/02/18 2109 07/03/18 0432  BP: 95/60 (!) 96/49 106/60 106/61  Pulse:   79 80  Resp:   16 14  Temp:   98 F (36.7 C) 98.2 F (36.8 C)  TempSrc:    Oral  SpO2:  98% 98%  Weight:      Height:        Intake/Output Summary (Last 24 hours) at 07/03/2018 0931 Last data filed at 07/03/2018 0600 Gross per 24 hour  Intake 1249.52 ml  Output 850 ml  Net 399.52 ml   Filed Weights   07/01/18 1930  Weight: 86.7 kg    Examination:  General exam: Appears calm and comfortable    Respiratory system bilateral scattered rhonchi to auscultation. Respiratory effort normal. Cardiovascular system: S1 & S2 heard, RRR. No JVD, murmurs, rubs, gallops or clicks. No pedal edema. Gastrointestinal system: Abdomen is nondistended, soft and nontender. No organomegaly or masses felt. Normal bowel sounds heard. Central nervous system: Alert and oriented. No focal neurological deficits. Extremities: 1+ edema erythema decreasing compared to yesterday skin: No rashes, lesions or ulcers Psychiatry: Judgement and insight appear normal. Mood & affect appropriate.     Data Reviewed: I have personally reviewed following labs and imaging studies  CBC: Recent Labs  Lab 06/30/18 1728 07/01/18 0359  WBC 3.5* 3.5*  NEUTROABS 1.9  --   HGB 8.9* 8.4*  HCT 30.6* 28.6*  MCV 82.9 82.9  PLT 143* 131*   Basic Metabolic Panel: Recent Labs  Lab 06/30/18 1728 07/01/18 0359 07/02/18 0417  NA 139 138 135  K 3.1* 2.8* 3.6  CL 101 98 98  CO2 27 32 31  GLUCOSE 92 104* 134*  BUN <5* <5* <5*  CREATININE 0.42* 0.40* 0.41*  CALCIUM 7.9* 7.4* 7.6*  MG  --  1.7  --    GFR: Estimated Creatinine Clearance: 78.3 mL/min (A) (by C-G formula based on SCr of 0.41 mg/dL (L)). Liver Function Tests: Recent Labs  Lab 06/30/18 1728 07/02/18 0417  AST 33 31  ALT 16 14  ALKPHOS 104 91  BILITOT 1.7* 2.0*  PROT 6.2* 5.6*  ALBUMIN 2.5* 2.2*   No results for input(s): LIPASE, AMYLASE in the last 168 hours. Recent Labs  Lab 07/02/18 0417  AMMONIA 99*   Coagulation Profile: No results for input(s): INR, PROTIME in the last 168 hours. Cardiac Enzymes: No results for input(s): CKTOTAL, CKMB, CKMBINDEX, TROPONINI in the last 168 hours. BNP (last 3 results) No results for input(s): PROBNP in the last 8760 hours. HbA1C: No results for input(s): HGBA1C in the last 72 hours. CBG: No results for input(s): GLUCAP in the last 168 hours. Lipid Profile: No results for input(s): CHOL, HDL, LDLCALC,  TRIG, CHOLHDL, LDLDIRECT in the last 72 hours. Thyroid Function Tests: No results for input(s): TSH, T4TOTAL, FREET4, T3FREE, THYROIDAB in the last 72 hours. Anemia Panel: No results for input(s): VITAMINB12, FOLATE, FERRITIN, TIBC, IRON, RETICCTPCT in the last 72 hours. Sepsis Labs: Recent Labs  Lab 06/30/18 1729  LATICACIDVEN 1.9    Recent Results (from the past 240 hour(s))  SARS Coronavirus 2 (CEPHEID - Performed in [facility] hospital lab), Hosp Order     Status: None   Collection Time: 06/25/18  5:25 AM  Result Value Ref Range Status   SARS Coronavirus 2 NEGATIVE NEGATIVE Final    Comment: (NOTE) If result is NEGATIVE SARS-CoV-2 target nucleic acids are NOT DETECTED. The SARS-CoV-2 RNA is generally detectable in upper and lower  respiratory specimens during the acute phase of infection. The lowest  concentration of SARS-CoV-2 viral copies this assay can detect is 250  copies / mL. A negative result does not preclude SARS-CoV-2 infection  and should not be used as the sole basis for treatment or other  patient  management decisions.  A negative result may occur with  improper specimen collection / handling, submission of specimen other  than nasopharyngeal swab, presence of viral mutation(s) within the  areas targeted by this assay, and inadequate number of viral copies  (<250 copies / mL). A negative result must be combined with clinical  observations, patient history, and epidemiological information. If result is POSITIVE SARS-CoV-2 target nucleic acids are DETECTED. The SARS-CoV-2 RNA is generally detectable in upper and lower  respiratory specimens dur ing the acute phase of infection.  Positive  results are indicative of active infection with SARS-CoV-2.  Clinical  correlation with patient history and other diagnostic information is  necessary to determine patient infection status.  Positive results do  not rule out bacterial infection or co-infection with other  viruses. If result is PRESUMPTIVE POSTIVE SARS-CoV-2 nucleic acids MAY BE PRESENT.   A presumptive positive result was obtained on the submitted specimen  and confirmed on repeat testing.  While 2019 novel coronavirus  (SARS-CoV-2) nucleic acids may be present in the submitted sample  additional confirmatory testing may be necessary for epidemiological  and / or clinical management purposes  to differentiate between  SARS-CoV-2 and other Sarbecovirus currently known to infect humans.  If clinically indicated additional testing with an alternate test  methodology [phone]) is advised. The SARS-CoV-2 RNA is generally  detectable in upper and lower respiratory sp ecimens during the acute  phase of infection. The expected result is Negative. Fact Sheet for Patients:  BoilerBrush.com.cy Fact Sheet for Healthcare Providers: https://pope.com/ This test is not yet approved or cleared by the [place] FDA and has been authorized for detection and/or diagnosis of SARS-CoV-2 by FDA under an Emergency Use Authorization (EUA).  This EUA will remain in effect (meaning this test can be used) for the duration of the COVID-19 declaration under Section 564(b)(1) of the Act, 21 U.S.C. section 360bbb-3(b)(1), unless the authorization is terminated or revoked sooner. Performed at [facility], [address], [place], [place] [address]   SARS Coronavirus 2 (CEPHEID - Performed in [facility] hospital lab), Hosp Order     Status: None   Collection Time: 06/30/18  7:49 PM  Result Value Ref Range Status   SARS Coronavirus 2 NEGATIVE NEGATIVE Final    Comment: (NOTE) If result is NEGATIVE SARS-CoV-2 target nucleic acids are NOT DETECTED. The SARS-CoV-2 RNA is generally detectable in upper and lower  respiratory specimens during the acute phase of infection. The lowest  concentration of SARS-CoV-2 viral copies this assay can detect is  250  copies / mL. A negative result does not preclude SARS-CoV-2 infection  and should not be used as the sole basis for treatment or other  patient management decisions.  A negative result may occur with  improper specimen collection / handling, submission of specimen other  than nasopharyngeal swab, presence of viral mutation(s) within the  areas targeted by this assay, and inadequate number of viral copies  (<250 copies / mL). A negative result must be combined with clinical  observations, patient history, and epidemiological information. If result is POSITIVE SARS-CoV-2 target nucleic acids are DETECTED. The SARS-CoV-2 RNA is generally detectable in upper and lower  respiratory specimens dur ing the acute phase of infection.  Positive  results are indicative of active infection with SARS-CoV-2.  Clinical  correlation with patient history and other diagnostic information is  necessary to determine patient infection status.  Positive results do  not rule out bacterial infection or  co-infection with other viruses. If result is PRESUMPTIVE POSTIVE SARS-CoV-2 nucleic acids MAY BE PRESENT.   A presumptive positive result was obtained on the submitted specimen  and confirmed on repeat testing.  While 2019 novel coronavirus  (SARS-CoV-2) nucleic acids may be present in the submitted sample  additional confirmatory testing may be necessary for epidemiological  and / or clinical management purposes  to differentiate between  SARS-CoV-2 and other Sarbecovirus currently known to infect humans.  If clinically indicated additional testing with an alternate test  methodology [phone]) is advised. The SARS-CoV-2 RNA is generally  detectable in upper and lower respiratory sp ecimens during the acute  phase of infection. The expected result is Negative. Fact Sheet for Patients:  BoilerBrush.com.cy Fact Sheet for Healthcare  Providers: https://pope.com/ This test is not yet approved or cleared by the [place] FDA and has been authorized for detection and/or diagnosis of SARS-CoV-2 by FDA under an Emergency Use Authorization (EUA).  This EUA will remain in effect (meaning this test can be used) for the duration of the COVID-19 declaration under Section 564(b)(1) of the Act, 21 U.S.C. section 360bbb-3(b)(1), unless the authorization is terminated or revoked sooner. Performed at [facility], [address] [place], [place] [address]          Radiology Studies: US Paracentesis  Result Date: 07/02/2018 INDICATION: Patient with history of alcoholic cirrhosis with recurrent ascites. Request is made for therapeutic paracentesis. EXAM: ULTRASOUND GUIDED THERAPEUTIC PARACENTESIS MEDICATIONS: 10 mL 1% lidocaine COMPLICATIONS: None immediate. PROCEDURE: Informed written consent was obtained from the patient after a discussion of the risks, benefits and alternatives to treatment. A timeout was performed prior to the initiation of the procedure. Initial ultrasound scanning demonstrates a moderate amount of ascites within the right lower abdominal quadrant. The right lower abdomen was prepped and draped in the usual sterile fashion. 1% lidocaine was used for local anesthesia. Following this, a 19 gauge, 7-cm, Yueh catheter was introduced. An ultrasound image was saved for documentation purposes. The paracentesis was performed. The catheter was removed and a dressing was applied. The patient tolerated the procedure well without immediate post procedural complication. FINDINGS: A total of approximately 5.7 L of clear gold fluid was removed. IMPRESSION: Successful ultrasound-guided paracentesis yielding 5.7 L of peritoneal fluid. Read by: Elwin Mocha, PA-C Electronically Signed   By: Gilmer Mor D.O.   On: 07/02/2018 14:40        Scheduled Meds:  calamine   Topical QID    diltiazem  120 mg Oral Daily   enoxaparin (LOVENOX) injection  40 mg Subcutaneous QHS   folic acid  1 mg Oral Daily   furosemide  40 mg Oral Daily   gabapentin  200 mg Oral TID   hydrOXYzine  10 mg Oral TID   lactulose  10 g Oral Daily   magnesium oxide  400 mg Oral Daily   multivitamin with minerals  1 tablet Oral Daily   nicotine  21 mg Transdermal Daily   pantoprazole  40 mg Oral Daily   potassium chloride SA  40 mEq Oral BID   sodium chloride flush  3 mL Intravenous Q12H   spironolactone  25 mg Oral Daily   thiamine  100 mg Oral Daily   Or   thiamine  100 mg Intravenous Daily   Continuous Infusions:  sodium chloride Stopped (07/02/18 1645)   clindamycin (CLEOCIN) IV 600 mg (07/03/18 0537)   vancomycin 750 mg (07/03/18 0310)     LOS: 2  days     Alwyn Ren, MD [facility]  If 7PM-7AM, please contact night-coverage [email or web address] Password [email or web address] 07/03/2018, 9:31 AM

## 2018-07-03 NOTE — Progress Notes (Signed)
Pt agitated. Nursing attempted to calm and reason with her. Pt trying to get out of bed, yelling at staff.  IV ativan given. MD called.  Safety sitter ordered.

## 2018-07-03 NOTE — Plan of Care (Signed)
  Problem: Education: Goal: Knowledge of General Education information will improve Description Including pain rating scale, medication(s)/side effects and non-pharmacologic comfort measures Outcome: Progressing   Problem: Health Behavior/Discharge Planning: Goal: Ability to manage health-related needs will improve Outcome: Progressing   Problem: Clinical Measurements: Goal: Ability to maintain clinical measurements within normal limits will improve Outcome: Progressing Goal: Will remain free from infection Outcome: Progressing   Problem: Nutrition: Goal: Adequate nutrition will be maintained Outcome: Progressing   Problem: Pain Managment: Goal: General experience of comfort will improve Outcome: Progressing   Problem: Safety: Goal: Ability to remain free from injury will improve Outcome: Progressing  Plan of care discussed with patient.

## 2018-07-04 DIAGNOSIS — F319 Bipolar disorder, unspecified: Secondary | ICD-10-CM

## 2018-07-04 MED ORDER — LORAZEPAM 1 MG PO TABS
1.0000 mg | ORAL_TABLET | Freq: Four times a day (QID) | ORAL | Status: DC | PRN
  Filled 2018-07-04: qty 1

## 2018-07-04 MED ORDER — LORAZEPAM 2 MG/ML IJ SOLN
1.0000 mg | Freq: Four times a day (QID) | INTRAMUSCULAR | Status: DC | PRN
  Administered 2018-07-04: 06:00:00 1 mg via INTRAVENOUS
  Filled 2018-07-04: qty 1

## 2018-07-04 NOTE — Progress Notes (Signed)
Patient is wanting to leave AMA and asked for paper to sign. AMA form given to patient and Clinical research associate read AMA statement to patient. Dr Jerolyn Center notified. Lina Sar, RN

## 2018-07-04 NOTE — Progress Notes (Signed)
Patient is oriented and has called a taxi to take her home. Dr Jerolyn Center notified. Lina Sar, RN

## 2018-07-04 NOTE — Discharge Summary (Signed)
Physician Discharge Summary  Anne Black:[id number] DOB: 05-08-1960 DOA: 06/30/2018  PCP: Patient, No Pcp Per  Admit date: 06/30/2018 Discharge date: 07/04/2018  Admitted From: Home  disposition: Patient went AGAINST MEDICAL ADVICE  Brief/Interim Summary:58 y.o.femalewith medical history significant formorbid obesity, alcoholic cirrhosis with varices and chronic ascites, copd, bipolar disorder, paroxysmal a-fib not anticoagulated, smoking, and chronic respiratory failure on home o2, presenting w/ the above.  Patient somnolent but arousable. Appears upset and only answers with short phrases. When asked why she is here, she says she doesn't know. When told that she reported leg swelling and pain, she agrees that yes, that's why she came.  She say she has chronic lower extremity swelling but says it has worsened and is not more painful than normal. Also more red than normal. Bilateral. No fevers. Says she is compliant with her home medications. Was recently admitted, discharged 5/12, treated for a fib w/ rvr and therapeutic paracentesis of 6 L performed. Extensive frequent admission history.  She denies cough or shortness of breath changed from baseline. She denies chest pain. She denies abdominal pain or nausea/vomiting. She denies history of dvt/pe.  ED Course:labs, clindomycin, lasix, albuterol   Discharge Diagnoses:  Active Problems:   Bipolar 1 disorder (HCC)   Tobacco abuse   Alcohol use disorder, moderate, dependence (HCC)   Nicotine abuse   Hypokalemia   Alcoholic cirrhosis of liver with ascites (HCC)   Multiple sclerosis (HCC)   Distended abdomen   Lower extremity edema   Chronic hypoxemic respiratory failure (HCC)   Cellulitis of lower extremity   Peripheral edema   #1 bilateral lower extremity cellulitis exacerbated by increasing lower extremity edema patient has chronic lower extremity edema secondary to advanced cirrhosis and venous stasis. Patient was  treated with vancomycin and clindamycin.  Her lower extremities started to improve with decreasing edema and erythema.  MRSA PCR was negative.  D-dimer was elevated lower extremity Doppler venous showed no evidence of acute DVT.  However patient left AGAINST MEDICAL ADVICE today.  She was seen by physical therapy and had recommended SNF.  Patient was awake alert and oriented and refused to take lactulose and did not want a sitter in the room.  She called taxi and left.  #2 cirrhosis of the liver with ongoing alcohol abuse status post 5.7 L removed during this admission.   #3 normocytic anemia of chronic disease  #4 chronic hypoxic respiratory failure patient is on 2 L of oxygen at home at all times. She continues to smoke.  #5 alcohol abuse continue CIWA protocol no tremors   Estimated body mass index is 34.96 kg/m as calculated from the following:   Height as of this encounter: 5\' 2"  (1.575 m).   Weight as of this encounter: 86.7 kg.  Discharge Instructions   Allergies as of 07/04/2018   No Known Allergies     Medication List    ASK your doctor about these medications   acetaminophen 500 MG tablet Commonly known as:  TYLENOL Take 1 tablet (500 mg total) by mouth every 6 (six) hours as needed for mild pain.   diltiazem 120 MG 24 hr capsule Commonly known as:  CARDIZEM CD Take 1 capsule (120 mg total) by mouth daily for 30 days.   furosemide 40 MG tablet Commonly known as:  LASIX Take 1 tablet (40 mg total) by mouth daily.   gabapentin 100 MG capsule Commonly known as:  NEURONTIN Take 2 capsules (200 mg total) by mouth 3 (  three) times daily for 30 days.   hydrOXYzine 25 MG tablet Commonly known as:  ATARAX/VISTARIL Take 1 tablet (25 mg total) by mouth daily as needed for up to 30 doses (anxiety).   magnesium oxide 400 MG tablet Commonly known as:  MAG-OX Take 400 mg by mouth daily.   methocarbamol 500 MG tablet Commonly known as:  ROBAXIN Take 1 tablet (500 mg  total) by mouth 2 (two) times daily as needed for muscle spasms.   ondansetron 4 MG tablet Commonly known as:  ZOFRAN Take 4 mg by mouth every 8 (eight) hours as needed for nausea or vomiting.   pantoprazole 40 MG tablet Commonly known as:  PROTONIX Take 1 tablet (40 mg total) by mouth daily for 30 days.   potassium chloride SA 20 MEQ tablet Commonly known as:  K-DUR Take 2 tablets (40 mEq total) by mouth 2 (two) times daily.   spironolactone 25 MG tablet Commonly known as:  ALDACTONE Take 1 tablet (25 mg total) by mouth daily for 30 days.       No Known Allergies  Consultations:  None   Procedures/Studies: Dg Chest 1 View  Result Date: 06/30/2018 CLINICAL DATA:  Shortness of breath EXAM: CHEST  1 VIEW COMPARISON:  05/30/2018 FINDINGS: The heart size and mediastinal contours are within normal limits. Both lungs are clear. The visualized skeletal structures are unremarkable. IMPRESSION: No active disease. Electronically Signed   By: Deatra Robinson M.D.   On: 06/30/2018 18:19   Ct Head Wo Contrast  Result Date: 06/13/2018 CLINICAL DATA:  Fall altered mental status EXAM: CT HEAD WITHOUT CONTRAST TECHNIQUE: Contiguous axial images were obtained from the base of the skull through the vertex without intravenous contrast. COMPARISON:  CT brain 05/14/2018, 05/12/2018, 05/09/2018 FINDINGS: Brain: No acute territorial infarction, hemorrhage or intracranial mass. Atrophy and moderate small vessel ischemic changes of the white matter. Stable ventricle size. Vascular: No hyperdense vessels.  Carotid vascular calcification Skull: Normal. Negative for fracture or focal lesion. Sinuses/Orbits: No acute finding. Mucosal thickening in the maxillary sinuses. Other: None IMPRESSION: 1. No CT evidence for acute intracranial abnormality. 2. Atrophy and small vessel ischemic changes of the white matter Electronically Signed   By: Jasmine Pang M.D.   On: 06/13/2018 22:01   US Paracentesis  Result Date:  07/02/2018 INDICATION: Patient with history of alcoholic cirrhosis with recurrent ascites. Request is made for therapeutic paracentesis. EXAM: ULTRASOUND GUIDED THERAPEUTIC PARACENTESIS MEDICATIONS: 10 mL 1% lidocaine COMPLICATIONS: None immediate. PROCEDURE: Informed written consent was obtained from the patient after a discussion of the risks, benefits and alternatives to treatment. A timeout was performed prior to the initiation of the procedure. Initial ultrasound scanning demonstrates a moderate amount of ascites within the right lower abdominal quadrant. The right lower abdomen was prepped and draped in the usual sterile fashion. 1% lidocaine was used for local anesthesia. Following this, a 19 gauge, 7-cm, Yueh catheter was introduced. An ultrasound image was saved for documentation purposes. The paracentesis was performed. The catheter was removed and a dressing was applied. The patient tolerated the procedure well without immediate post procedural complication. FINDINGS: A total of approximately 5.7 L of clear gold fluid was removed. IMPRESSION: Successful ultrasound-guided paracentesis yielding 5.7 L of peritoneal fluid. Read by: Elwin Mocha, PA-C Electronically Signed   By: Gilmer Mor D.O.   On: 07/02/2018 14:40   Vas [place] Lower Extremity Venous (dvt)  Result Date: 07/03/2018  Lower Venous Study Indications: Swelling.  Risk Factors: Cirrhosis of liver.  Had 5.7 liters of fluid removed by paracentesis 07/02/2018. Comparison Study: Prior negative study from 04/14/18 is available for comparison. Performing Technologist: Sherren Kerns RVS  Examination Guidelines: A complete evaluation includes B-mode imaging, spectral Doppler, color Doppler, and power Doppler as needed of all accessible portions of each vessel. Bilateral testing is considered an integral part of a complete examination. Limited examinations for reoccurring indications may be performed as noted.   +---------+---------------+---------+-----------+----------+-------+ RIGHT    CompressibilityPhasicitySpontaneityPropertiesSummary +---------+---------------+---------+-----------+----------+-------+ CFV      Full           Yes      Yes                          +---------+---------------+---------+-----------+----------+-------+ SFJ      Full                                                 +---------+---------------+---------+-----------+----------+-------+ FV Prox  Full                                                 +---------+---------------+---------+-----------+----------+-------+ FV Mid   Full                                                 +---------+---------------+---------+-----------+----------+-------+ FV DistalFull                                                 +---------+---------------+---------+-----------+----------+-------+ PFV      Full                                                 +---------+---------------+---------+-----------+----------+-------+ POP      Full           Yes      Yes                          +---------+---------------+---------+-----------+----------+-------+ PTV      Full                                                 +---------+---------------+---------+-----------+----------+-------+ PERO     Full                                                 +---------+---------------+---------+-----------+----------+-------+   +---------+---------------+---------+-----------+----------+-------+ LEFT     CompressibilityPhasicitySpontaneityPropertiesSummary +---------+---------------+---------+-----------+----------+-------+ CFV      Full           Yes      Yes                          +---------+---------------+---------+-----------+----------+-------+  SFJ      Full                                                 +---------+---------------+---------+-----------+----------+-------+ FV Prox  Full                                                  +---------+---------------+---------+-----------+----------+-------+ FV Mid   Full                                                 +---------+---------------+---------+-----------+----------+-------+ FV DistalFull                                                 +---------+---------------+---------+-----------+----------+-------+ PFV      Full                                                 +---------+---------------+---------+-----------+----------+-------+ POP      Full           Yes      Yes                          +---------+---------------+---------+-----------+----------+-------+ PTV      Full                                                 +---------+---------------+---------+-----------+----------+-------+ PERO     Full                                                 +---------+---------------+---------+-----------+----------+-------+     Summary: Right: There is no evidence of deep vein thrombosis in the lower extremity. Left: There is no evidence of deep vein thrombosis in the lower extremity.  *See table(s) above for measurements and observations. Electronically signed by Waverly Ferrari MD on 07/03/2018 at 2:44:23 PM.    Final    Ir Paracentesis  Result Date: 06/14/2018 INDICATION: Patient with history of alcoholic cirrhosis, recurrent ascites. Request made for diagnostic and therapeutic paracentesis. EXAM: ULTRASOUND GUIDED DIAGNOSTIC AND THERAPEUTIC PARACENTESIS MEDICATIONS: None COMPLICATIONS: None immediate. PROCEDURE: Informed written consent was obtained from the patient after a discussion of the risks, benefits and alternatives to treatment. A timeout was performed prior to the initiation of the procedure. Initial ultrasound scanning demonstrates a large amount of ascites within the right lower abdominal quadrant. The right lower abdomen was prepped and draped in the usual sterile fashion. 1%  lidocaine was used for local anesthesia. Following this, a 19 gauge, 10-cm, Yueh catheter was introduced.  An ultrasound image was saved for documentation purposes. The paracentesis was performed. The catheter was removed and a dressing was applied. The patient tolerated the procedure well without immediate post procedural complication. FINDINGS: A total of approximately 6.1 liters of yellow fluid was removed. Samples were sent to the laboratory as requested by the clinical team. IMPRESSION: Successful ultrasound-guided diagnostic and therapeutic paracentesis yielding 6.1 liters of peritoneal fluid. Read by: Jeananne Rama, PA-C Electronically Signed   By: Gilmer Mor D.O.   On: 06/14/2018 16:40    (Echo, Carotid, EGD, Colonoscopy, ERCP)    Subjective: Sitting up in chair asking to go home she fired me as a doctor she is said she is knows where she is and she knows what she is doing and if she goes home it is totally her responsibility even though I explained to her that she could fall down break bones even going to sepsis and even die.  Discharge Exam: Vitals:   07/03/18 2101 07/04/18 0454  BP: 120/90 (!) 117/53  Pulse: 79 87  Resp: 18 18  Temp: 97.8 F (36.6 C) 98.3 F (36.8 C)  SpO2: 100% 99%   Vitals:   07/03/18 1319 07/03/18 1428 07/03/18 2101 07/04/18 0454  BP: (!) 95/42 107/75 120/90 (!) 117/53  Pulse: 79 77 79 87  Resp: 18  18 18   Temp: (!) 97.3 F (36.3 C)  97.8 F (36.6 C) 98.3 F (36.8 C)  TempSrc:   Oral Oral  SpO2: 98%  100% 99%  Weight:      Height:        General: Pt is alert, awake, not in acute distress Cardiovascular: RRR, S1/S2 +, no rubs, no gallops Respiratory: CTA bilaterally, no wheezing, no rhonchi Abdominal: Soft, NT, ND, bowel sounds + Extremities: 2+ pitting edema decreasing erythema   The results of significant diagnostics from this hospitalization (including imaging, microbiology, ancillary and laboratory) are listed below for reference.      Microbiology: Recent Results (from the past 240 hour(s))  SARS Coronavirus 2 (CEPHEID - Performed in [facility] hospital lab), Hosp Order     Status: None   Collection Time: 06/25/18  5:25 AM  Result Value Ref Range Status   SARS Coronavirus 2 NEGATIVE NEGATIVE Final    Comment: (NOTE) If result is NEGATIVE SARS-CoV-2 target nucleic acids are NOT DETECTED. The SARS-CoV-2 RNA is generally detectable in upper and lower  respiratory specimens during the acute phase of infection. The lowest  concentration of SARS-CoV-2 viral copies this assay can detect is 250  copies / mL. A negative result does not preclude SARS-CoV-2 infection  and should not be used as the sole basis for treatment or other  patient management decisions.  A negative result may occur with  improper specimen collection / handling, submission of specimen other  than nasopharyngeal swab, presence of viral mutation(s) within the  areas targeted by this assay, and inadequate number of viral copies  (<250 copies / mL). A negative result must be combined with clinical  observations, patient history, and epidemiological information. If result is POSITIVE SARS-CoV-2 target nucleic acids are DETECTED. The SARS-CoV-2 RNA is generally detectable in upper and lower  respiratory specimens dur ing the acute phase of infection.  Positive  results are indicative of active infection with SARS-CoV-2.  Clinical  correlation with patient history and other diagnostic information is  necessary to determine patient infection status.  Positive results do  not rule out bacterial infection or co-infection with other viruses. If  result is PRESUMPTIVE POSTIVE SARS-CoV-2 nucleic acids MAY BE PRESENT.   A presumptive positive result was obtained on the submitted specimen  and confirmed on repeat testing.  While 2019 novel coronavirus  (SARS-CoV-2) nucleic acids may be present in the submitted sample  additional confirmatory testing may be  necessary for epidemiological  and / or clinical management purposes  to differentiate between  SARS-CoV-2 and other Sarbecovirus currently known to infect humans.  If clinically indicated additional testing with an alternate test  methodology [phone]) is advised. The SARS-CoV-2 RNA is generally  detectable in upper and lower respiratory sp ecimens during the acute  phase of infection. The expected result is Negative. Fact Sheet for Patients:  BoilerBrush.com.cy Fact Sheet for Healthcare Providers: https://pope.com/ This test is not yet approved or cleared by the [place] FDA and has been authorized for detection and/or diagnosis of SARS-CoV-2 by FDA under an Emergency Use Authorization (EUA).  This EUA will remain in effect (meaning this test can be used) for the duration of the COVID-19 declaration under Section 564(b)(1) of the Act, 21 U.S.C. section 360bbb-3(b)(1), unless the authorization is terminated or revoked sooner. Performed at [facility], [address], [place], [place] [address]   SARS Coronavirus 2 (CEPHEID - Performed in [facility] hospital lab), Hosp Order     Status: None   Collection Time: 06/30/18  7:49 PM  Result Value Ref Range Status   SARS Coronavirus 2 NEGATIVE NEGATIVE Final    Comment: (NOTE) If result is NEGATIVE SARS-CoV-2 target nucleic acids are NOT DETECTED. The SARS-CoV-2 RNA is generally detectable in upper and lower  respiratory specimens during the acute phase of infection. The lowest  concentration of SARS-CoV-2 viral copies this assay can detect is 250  copies / mL. A negative result does not preclude SARS-CoV-2 infection  and should not be used as the sole basis for treatment or other  patient management decisions.  A negative result may occur with  improper specimen collection / handling, submission of specimen other  than nasopharyngeal swab, presence of viral  mutation(s) within the  areas targeted by this assay, and inadequate number of viral copies  (<250 copies / mL). A negative result must be combined with clinical  observations, patient history, and epidemiological information. If result is POSITIVE SARS-CoV-2 target nucleic acids are DETECTED. The SARS-CoV-2 RNA is generally detectable in upper and lower  respiratory specimens dur ing the acute phase of infection.  Positive  results are indicative of active infection with SARS-CoV-2.  Clinical  correlation with patient history and other diagnostic information is  necessary to determine patient infection status.  Positive results do  not rule out bacterial infection or co-infection with other viruses. If result is PRESUMPTIVE POSTIVE SARS-CoV-2 nucleic acids MAY BE PRESENT.   A presumptive positive result was obtained on the submitted specimen  and confirmed on repeat testing.  While 2019 novel coronavirus  (SARS-CoV-2) nucleic acids may be present in the submitted sample  additional confirmatory testing may be necessary for epidemiological  and / or clinical management purposes  to differentiate between  SARS-CoV-2 and other Sarbecovirus currently known to infect humans.  If clinically indicated additional testing with an alternate test  methodology [phone]) is advised. The SARS-CoV-2 RNA is generally  detectable in upper and lower respiratory sp ecimens during the acute  phase of infection. The expected result is Negative. Fact Sheet for Patients:  BoilerBrush.com.cy Fact Sheet for Healthcare Providers: https://pope.com/ This test is not yet  approved or cleared by the [place] and has been authorized for detection and/or diagnosis of SARS-CoV-2 by FDA under an Emergency Use Authorization (EUA).  This EUA will remain in effect (meaning this test can be used) for the duration of the COVID-19 declaration under Section 564(b)(1)  of the Act, 21 U.S.C. section 360bbb-3(b)(1), unless the authorization is terminated or revoked sooner. Performed at [facility], [address] [place], [place] [address]      Labs: BNP (last 3 results) Recent Labs    04/24/18 0439 05/30/18 0903 06/30/18 1729  BNP 461.5* 60.2 81.9   Basic Metabolic Panel: Recent Labs  Lab 06/30/18 1728 07/01/18 0359 07/02/18 0417 07/03/18 0944  NA 139 138 135 133*  K 3.1* 2.8* 3.6 3.8  CL 101 98 98 96*  CO2 27 32 31 32  GLUCOSE 92 104* 134* 120*  BUN <5* <5* <5* <5*  CREATININE 0.42* 0.40* 0.41* 0.39*  CALCIUM 7.9* 7.4* 7.6* 7.8*  MG  --  1.7  --   --    Liver Function Tests: Recent Labs  Lab 06/30/18 1728 07/02/18 0417 07/03/18 0944  AST 33 31 23  ALT ALKPHOS 104 91 83  BILITOT 1.7* 2.0* 1.5*  PROT 6.2* 5.6* 5.3*  ALBUMIN 2.5* 2.2* 2.1*   No results for input(s): LIPASE, AMYLASE in the last 168 hours. Recent Labs  Lab 07/02/18 0417  AMMONIA 99*   CBC: Recent Labs  Lab 06/30/18 1728 07/01/18 0359 07/03/18 0944  WBC 3.5* 3.5* 3.1*  NEUTROABS 1.9  --   --   HGB 8.9* 8.4* 8.4*  HCT 30.6* 28.6* 29.6*  MCV 82.9 82.9 84.8  PLT 143* 131* 97*   Cardiac Enzymes: No results for input(s): CKTOTAL, CKMB, CKMBINDEX, TROPONINI in the last 168 hours. BNP: Invalid input(s): POCBNP CBG: No results for input(s): GLUCAP in the last 168 hours. D-Dimer No results for input(s): DDIMER in the last 72 hours. Hgb A1c No results for input(s): HGBA1C in the last 72 hours. Lipid Profile No results for input(s): CHOL, HDL, LDLCALC, TRIG, CHOLHDL, LDLDIRECT in the last 72 hours. Thyroid function studies No results for input(s): TSH, T4TOTAL, T3FREE, THYROIDAB in the last 72 hours.  Invalid input(s): FREET3 Anemia work up No results for input(s): VITAMINB12, FOLATE, FERRITIN, TIBC, IRON, RETICCTPCT in the last 72 hours. Urinalysis    Component Value Date/Time   COLORURINE YELLOW 06/13/2018 2002    APPEARANCEUR CLEAR 06/13/2018 2002   LABSPEC 1.006 06/13/2018 2002   PHURINE 6.0 06/13/2018 2002   GLUCOSEU NEGATIVE 06/13/2018 2002   HGBUR NEGATIVE 06/13/2018 2002   BILIRUBINUR NEGATIVE 06/13/2018 2002   BILIRUBINUR moderate (A) 06/19/2015 1906   KETONESUR NEGATIVE 06/13/2018 2002   PROTEINUR NEGATIVE 06/13/2018 2002   UROBILINOGEN >=8.0 06/19/2015 1906   UROBILINOGEN 0.2 09/14/2011 2141   NITRITE NEGATIVE 06/13/2018 2002   LEUKOCYTESUR NEGATIVE 06/13/2018 2002   Sepsis Labs Invalid input(s): PROCALCITONIN,  WBC,  LACTICIDVEN Microbiology Recent Results (from the past 240 hour(s))  SARS Coronavirus 2 (CEPHEID - Performed in [facility] hospital lab), Hosp Order     Status: None   Collection Time: 06/25/18  5:25 AM  Result Value Ref Range Status   SARS Coronavirus 2 NEGATIVE NEGATIVE Final    Comment: (NOTE) If result is NEGATIVE SARS-CoV-2 target nucleic acids are NOT DETECTED. The SARS-CoV-2 RNA is generally detectable in upper and lower  respiratory specimens during the acute phase of infection. The lowest  concentration of SARS-CoV-2  viral copies this assay can detect is 250  copies / mL. A negative result does not preclude SARS-CoV-2 infection  and should not be used as the sole basis for treatment or other  patient management decisions.  A negative result may occur with  improper specimen collection / handling, submission of specimen other  than nasopharyngeal swab, presence of viral mutation(s) within the  areas targeted by this assay, and inadequate number of viral copies  (<250 copies / mL). A negative result must be combined with clinical  observations, patient history, and epidemiological information. If result is POSITIVE SARS-CoV-2 target nucleic acids are DETECTED. The SARS-CoV-2 RNA is generally detectable in upper and lower  respiratory specimens dur ing the acute phase of infection.  Positive  results are indicative of active infection with SARS-CoV-2.   Clinical  correlation with patient history and other diagnostic information is  necessary to determine patient infection status.  Positive results do  not rule out bacterial infection or co-infection with other viruses. If result is PRESUMPTIVE POSTIVE SARS-CoV-2 nucleic acids MAY BE PRESENT.   A presumptive positive result was obtained on the submitted specimen  and confirmed on repeat testing.  While 2019 novel coronavirus  (SARS-CoV-2) nucleic acids may be present in the submitted sample  additional confirmatory testing may be necessary for epidemiological  and / or clinical management purposes  to differentiate between  SARS-CoV-2 and other Sarbecovirus currently known to infect humans.  If clinically indicated additional testing with an alternate test  methodology [phone]) is advised. The SARS-CoV-2 RNA is generally  detectable in upper and lower respiratory sp ecimens during the acute  phase of infection. The expected result is Negative. Fact Sheet for Patients:  BoilerBrush.com.cy Fact Sheet for Healthcare Providers: https://pope.com/ This test is not yet approved or cleared by the [place] FDA and has been authorized for detection and/or diagnosis of SARS-CoV-2 by FDA under an Emergency Use Authorization (EUA).  This EUA will remain in effect (meaning this test can be used) for the duration of the COVID-19 declaration under Section 564(b)(1) of the Act, 21 U.S.C. section 360bbb-3(b)(1), unless the authorization is terminated or revoked sooner. Performed at [facility], [address], [place], [place] [address]   SARS Coronavirus 2 (CEPHEID - Performed in [facility] hospital lab), Hosp Order     Status: None   Collection Time: 06/30/18  7:49 PM  Result Value Ref Range Status   SARS Coronavirus 2 NEGATIVE NEGATIVE Final    Comment: (NOTE) If result is NEGATIVE SARS-CoV-2 target nucleic acids are NOT  DETECTED. The SARS-CoV-2 RNA is generally detectable in upper and lower  respiratory specimens during the acute phase of infection. The lowest  concentration of SARS-CoV-2 viral copies this assay can detect is 250  copies / mL. A negative result does not preclude SARS-CoV-2 infection  and should not be used as the sole basis for treatment or other  patient management decisions.  A negative result may occur with  improper specimen collection / handling, submission of specimen other  than nasopharyngeal swab, presence of viral mutation(s) within the  areas targeted by this assay, and inadequate number of viral copies  (<250 copies / mL). A negative result must be combined with clinical  observations, patient history, and epidemiological information. If result is POSITIVE SARS-CoV-2 target nucleic acids are DETECTED. The SARS-CoV-2 RNA is generally detectable in upper and lower  respiratory specimens dur ing the acute phase of infection.  Positive  results are  indicative of active infection with SARS-CoV-2.  Clinical  correlation with patient history and other diagnostic information is  necessary to determine patient infection status.  Positive results do  not rule out bacterial infection or co-infection with other viruses. If result is PRESUMPTIVE POSTIVE SARS-CoV-2 nucleic acids MAY BE PRESENT.   A presumptive positive result was obtained on the submitted specimen  and confirmed on repeat testing.  While 2019 novel coronavirus  (SARS-CoV-2) nucleic acids may be present in the submitted sample  additional confirmatory testing may be necessary for epidemiological  and / or clinical management purposes  to differentiate between  SARS-CoV-2 and other Sarbecovirus currently known to infect humans.  If clinically indicated additional testing with an alternate test  methodology [phone]) is advised. The SARS-CoV-2 RNA is generally  detectable in upper and lower respiratory sp ecimens during  the acute  phase of infection. The expected result is Negative. Fact Sheet for Patients:  BoilerBrush.com.cy Fact Sheet for Healthcare Providers: https://pope.com/ This test is not yet approved or cleared by the [place] FDA and has been authorized for detection and/or diagnosis of SARS-CoV-2 by FDA under an Emergency Use Authorization (EUA).  This EUA will remain in effect (meaning this test can be used) for the duration of the COVID-19 declaration under Section 564(b)(1) of the Act, 21 U.S.C. section 360bbb-3(b)(1), unless the authorization is terminated or revoked sooner. Performed at [facility], [address], [place], [place] [address]      Time coordinating discharge:33 minutes  SIGNED:   Alwyn Ren, MD  [facility] 07/04/2018, 11:33 AM Pager   If 7PM-7AM, please contact night-coverage [email or web address] Password [email or web address]

## 2018-07-04 NOTE — Progress Notes (Signed)
Patient refused to get back in bed from the bedside commode. She said she didn't want help. 1mg  of Ativan was given to her. Safety sitter at bedside.

## 2018-07-10 ENCOUNTER — Encounter (HOSPITAL_COMMUNITY): Payer: Self-pay

## 2018-07-10 ENCOUNTER — Emergency Department (HOSPITAL_COMMUNITY)
Admission: EM | Admit: 2018-07-10 | Discharge: 2018-07-10 | Disposition: A | Discharge status: Home / Self Care | Payer: Self-pay | Attending: Emergency Medicine | Admitting: Emergency Medicine

## 2018-07-10 ENCOUNTER — Other Ambulatory Visit: Payer: Self-pay

## 2018-07-10 DIAGNOSIS — F17210 Nicotine dependence, cigarettes, uncomplicated: Secondary | ICD-10-CM | POA: Insufficient documentation

## 2018-07-10 DIAGNOSIS — F319 Bipolar disorder, unspecified: Secondary | ICD-10-CM | POA: Insufficient documentation

## 2018-07-10 DIAGNOSIS — Z9114 Patient's other noncompliance with medication regimen: Secondary | ICD-10-CM | POA: Insufficient documentation

## 2018-07-10 DIAGNOSIS — J449 Chronic obstructive pulmonary disease, unspecified: Secondary | ICD-10-CM | POA: Insufficient documentation

## 2018-07-10 DIAGNOSIS — F10920 Alcohol use, unspecified with intoxication, uncomplicated: Secondary | ICD-10-CM

## 2018-07-10 DIAGNOSIS — Z79899 Other long term (current) drug therapy: Secondary | ICD-10-CM | POA: Insufficient documentation

## 2018-07-10 LAB — ETHANOL: Alcohol, Ethyl (B): 252 mg/dL — ABNORMAL HIGH (ref <10)

## 2018-07-10 LAB — COMPREHENSIVE METABOLIC PANEL
ALT: 21 U/L (ref 0–44)
AST: 72 U/L — ABNORMAL HIGH (ref 15–41)
Albumin: 2.8 g/dL — ABNORMAL LOW (ref 3.5–5.0)
Alkaline Phosphatase: 144 U/L — ABNORMAL HIGH (ref 38–126)
Anion gap: 11 (ref 5–15)
BUN: <5 mg/dL — ABNORMAL LOW (ref 6–20)
CO2: 25 mmol/L (ref 22–32)
Calcium: 7.5 mg/dL — ABNORMAL LOW (ref 8.9–10.3)
Chloride: 100 mmol/L (ref 98–111)
Creatinine, Ser: 0.32 mg/dL — ABNORMAL LOW (ref 0.44–1.00)
GFR calc Af Amer: >60 mL/min (ref >60)
GFR calc non Af Amer: >60 mL/min (ref >60)
Glucose, Bld: 104 mg/dL — ABNORMAL HIGH (ref 70–99)
Potassium: 4 mmol/L (ref 3.5–5.1)
Sodium: 136 mmol/L (ref 135–145)
Total Bilirubin: 1.8 mg/dL — ABNORMAL HIGH (ref 0.3–1.2)
Total Protein: 7 g/dL (ref 6.5–8.1)

## 2018-07-10 LAB — CBC WITH DIFFERENTIAL/PLATELET
Band Neutrophils: 0 %
Basophils Absolute: 0.1 10*3/uL (ref 0.0–0.1)
Basophils Relative: 2 %
Blasts: 0 %
Eosinophils Absolute: 0.1 10*3/uL (ref 0.0–0.5)
Eosinophils Relative: 3 %
HCT: 37.3 % (ref 36.0–46.0)
Hemoglobin: 11.1 g/dL — ABNORMAL LOW (ref 12.0–15.0)
Lymphocytes Relative: 31 %
Lymphs Abs: 0.9 10*3/uL (ref 0.7–4.0)
MCH: 24.2 pg — ABNORMAL LOW (ref 26.0–34.0)
MCHC: 29.8 g/dL — ABNORMAL LOW (ref 30.0–36.0)
MCV: 81.3 fL (ref 80.0–100.0)
Metamyelocytes Relative: 0 %
Monocytes Absolute: 0.5 10*3/uL (ref 0.1–1.0)
Monocytes Relative: 15 %
Myelocytes: 0 %
Neutro Abs: 1.4 10*3/uL — ABNORMAL LOW (ref 1.7–7.7)
Neutrophils Relative %: 49 %
Other: 0 %
Platelets: 150 10*3/uL (ref 150–400)
Promyelocytes Relative: 0 %
RBC: 4.59 MIL/uL (ref 3.87–5.11)
RDW: 21.6 % — ABNORMAL HIGH (ref 11.5–15.5)
WBC: 3 10*3/uL — ABNORMAL LOW (ref 4.0–10.5)
nRBC: 0 % (ref 0.0–0.2)
nRBC: 0 /100 WBC

## 2018-07-10 LAB — LIPASE, BLOOD: Lipase: 32 U/L (ref 11–51)

## 2018-07-10 LAB — AMMONIA: Ammonia: 34 umol/L (ref 9–35)

## 2018-07-10 NOTE — ED Notes (Signed)
Bed: WA14 Expected date:  Expected time:  Means of arrival:  Comments: EMS 

## 2018-07-10 NOTE — ED Notes (Signed)
Pt continues to be argumentative with nursing staff, refusing to stay in bed and hooked to hospital equipment. EDP made aware

## 2018-07-10 NOTE — Discharge Instructions (Addendum)
Avoid drinking excessive amounts of alcohol.  Use the attached information about obtaining help for alcohol cessation.  Take all your medications, as directed.  Follow-up with your primary care doctor soon as possible for further care and treatment.

## 2018-07-10 NOTE — ED Notes (Signed)
EDP at bedside  

## 2018-07-10 NOTE — ED Notes (Addendum)
Spoke with EDP , informs this nurse pt is willing to stay for treatment
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
## 2018-07-10 NOTE — ED Notes (Signed)
Patient discharged from ED to the care of her ex-husband. Patient ambulated from ED without assistance. Patient upset that she did not get her peanut butter and jelly sandwich and a new size of paper scrubs. Patient given discharge paperwork. Patient refused to sign.

## 2018-07-10 NOTE — ED Provider Notes (Signed)
[facility] DEPT Provider Note   CSN: [id number] Arrival date & time: 07/10/18  1128    History   Chief Complaint Chief Complaint  Patient presents with  . Abdominal Pain    HPI Anne Black is a 58 y.o. female.     HPI   She presents for evaluation of a desire to stop drinking alcohol.  She states she called EMS to bring her here today.  Since arrival she has decided that she wants to go directly to a psychiatric care facility, the [facility].  She states that she has previously been there for psychiatric care.  She was recently discharged from the hospital.  She states that she did not take her morning medicines.  Also, he did not continue taking lactulose after she left the hospital because it causes diarrhea.  She continues to drink and states her last alcohol intake was yesterday.  She states that when she stops drinking she has a "strong desire to drink."  She denies a history of delirium tremens, seizure or other significant alcohol withdrawal problems.  She denies recent fever, chills, cough or shortness of breath.  She states that she has a history of atrial fibrillation.  There are no other known modifying factors.  Past Medical History:  Diagnosis Date  . ADHD (attention deficit hyperactivity disorder)   . Alcoholic cirrhosis ([place])    with ascites  . Alcoholism ([place])   . Allergies   . Anxiety   . Arthritis   . Atrial fibrillation with RVR ([place])   . Bipolar disorder ([place])   . COPD (chronic obstructive pulmonary disease) (HCC)    wears 2L chronic O2  . Dyspnea   . EtOH dependence ([place]) 05/14/2018  . GERD (gastroesophageal reflux disease)   . History of hiatal hernia   . Hypokalemia   . Migraine   . Multiple sclerosis ([place])   . Narcolepsy   . Osteoarthritis of knee   . Osteoporosis   . Pneumonia     Patient Active Problem List   Diagnosis Date Noted  . Cellulitis of lower extremity   . Peripheral edema   . Atrial  fibrillation with RVR ([place]) 06/13/2018  . Alcohol dependence ([place]) 05/06/2018  . Electrolyte disturbance 05/05/2018  . Alcohol withdrawal delirium ([place]) 04/28/2018  . Weakness generalized 04/27/2018  . Gait abnormality 04/24/2018  . Chronic hypoxemic respiratory failure ([place]) 04/11/2018  . Atrial fibrillation with rapid ventricular response ([place]) 03/23/2018  . Head injury   . Fall 03/19/2018  . Syncope 03/19/2018  . Orbital fracture 03/19/2018  . Scalp laceration 03/19/2018  . Acute respiratory failure with hypoxia and hypercarbia ([place]) 03/19/2018  . Abdominal tenderness 03/19/2018  . Acute on chronic respiratory failure with hypoxia ([place]) 03/17/2018  . Alcohol abuse with intoxication ([place]) 03/17/2018  . ETOH abuse   . Normocytic anemia 03/16/2018  . Acute metabolic encephalopathy 63/78/5885  . Lower extremity edema 03/16/2018  . Migraine 03/16/2018  . Community acquired pneumonia of left lower lobe of lung ([place])   . Weakness 08/21/2017  . COPD exacerbation ([place]) 06/12/2017  . Chest pain with moderate risk for cardiac etiology 05/30/2017  . Abnormal LFTs 05/30/2017  . Tachycardia 05/30/2017  . COPD (chronic obstructive pulmonary disease) ([place]) 04/19/2017  . Right rib fracture 04/15/2017  . Abdominal wall cellulitis 12/30/2016  . S/P exploratory laparotomy 12/30/2016  . Distal radius fracture, left 09/09/2016  . Thrombocytopenia ([place]) 09/09/2016  . Chest x-ray abnormality   . Umbilical hernia  without obstruction and without gangrene 05/12/2016  . Itching 02/24/2016  . Ventral hernia without obstruction or gangrene 02/24/2016  . Palliative care encounter   . Ascites   . Tachypnea   . Atrial flutter with rapid ventricular response ([place])   . Hypomagnesemia   . Hypoxia   . Dehydration 01/22/2016  . Low back ache 12/30/2015  . Non-intractable vomiting with nausea   . Alcohol abuse with alcohol-induced mood disorder ([place]) 12/15/2015  . Acute alcoholism ([place])   . Distended  abdomen 11/04/2015  . Alcohol abuse   . Dyspnea   . Acute respiratory failure ([place]) 10/15/2015  . Ascites due to alcoholic cirrhosis ([place]) 75/64/3329  . Osteoporosis   . Multiple sclerosis ([place])   . Alcoholic cirrhosis of liver with ascites ([place]) 08/22/2015  . Bipolar disorder, current episode mixed, moderate ([place]) 08/01/2015  . Alcohol use disorder, severe, dependence ([place]) 07/31/2015  . Macrocytic anemia- due to alcohol abuse with normal B12 & folate levels 05/31/2015  . Severe protein-calorie malnutrition ([place]) 05/31/2015  . Hypokalemia 05/26/2015  . Encephalopathy, hepatic ([place]) 05/26/2015  . Alcohol withdrawal ([place]) 05/18/2015  . Abdominal pain 05/18/2015  . Anxiety 05/18/2015  . Stimulant abuse ([place]) 12/27/2014  . Nicotine abuse 12/27/2014  . Hyperprolactinemia ([place]) 11/15/2014  . Dyslipidemia   . Alcohol use disorder, moderate, dependence ([place]) 10/30/2014  . Tobacco abuse 01/23/2014  . Bipolar 1 disorder ([place]) 04/07/2013  . Noncompliance with therapeutic plan 04/04/2013  . Hereditary and idiopathic peripheral neuropathy 05/01/2012  . Depression 05/01/2012  . GERD (gastroesophageal reflux disease) 05/01/2012  . Osteoarthrosis, unspecified whether generalized or localized, involving lower leg 05/01/2012  . Hyponatremia 07/01/2011    Past Surgical History:  Procedure Laterality Date  . ABDOMINAL WALL DEFECT REPAIR N/A 12/30/2016   Procedure: EXPLORATORY LAPAROTOMY WITH REPAIR ABDOMINAL WALL VENTRAL HERNIA;  Surgeon: Rolm Bookbinder, MD;  Location: [facility];  Service: General;  Laterality: N/A;  . APPLICATION OF WOUND VAC N/A 12/30/2016   Procedure: APPLICATION OF WOUND VAC;  Surgeon: Rolm Bookbinder, MD;  Location: [facility];  Service: General;  Laterality: N/A;  . CESAREAN SECTION  [phone]  . FRACTURE SURGERY    . HERNIA REPAIR    . IR PARACENTESIS  06/14/2018  . MYRINGOTOMY WITH TUBE PLACEMENT Bilateral   . ORIF WRIST FRACTURE Left 09/09/2016   Procedure: OPEN REDUCTION  INTERNAL FIXATION (ORIF) LEFT WRIST FRACTURE, LEFT CARPAL TUNNEL RELEASE;  Surgeon: Roseanne Kaufman, MD;  Location: [facility];  Service: Orthopedics;  Laterality: Left;  . TONSILLECTOMY       OB History   No obstetric history on file.      Home Medications    Prior to Admission medications   Medication Sig Start Date End Date Taking? Authorizing Provider  acetaminophen (TYLENOL) 500 MG tablet Take 1 tablet (500 mg total) by mouth every 6 (six) hours as needed for mild pain. Patient taking differently: Take 1,000 mg by mouth every 6 (six) hours as needed for mild pain.  03/28/18   Lavina Hamman, MD  diltiazem (CARDIZEM CD) 120 MG 24 hr capsule Take 1 capsule (120 mg total) by mouth daily for 30 days. 04/14/18 07/01/18  Tegeler, Gwenyth Allegra, MD  furosemide (LASIX) 40 MG tablet Take 1 tablet (40 mg total) by mouth daily. 06/27/18   Maudie Flakes, MD  gabapentin (NEURONTIN) 100 MG capsule Take 2 capsules (200 mg total) by mouth 3 (three) times daily for 30 days. 05/19/18 07/01/18  Lacretia Leigh, MD  hydrOXYzine (ATARAX/VISTARIL) 25  MG tablet Take 1 tablet (25 mg total) by mouth daily as needed for up to 30 doses (anxiety). Patient taking differently: Take 25 mg by mouth daily as needed for anxiety (withdrawal).  04/14/18   Tegeler, Gwenyth Allegra, MD  magnesium oxide (MAG-OX) 400 MG tablet Take 400 mg by mouth daily.    [provider]  methocarbamol (ROBAXIN) 500 MG tablet Take 1 tablet (500 mg total) by mouth 2 (two) times daily as needed for muscle spasms. 06/27/18   Maudie Flakes, MD  ondansetron (ZOFRAN) 4 MG tablet Take 4 mg by mouth every 8 (eight) hours as needed for nausea or vomiting.    [provider]  pantoprazole (PROTONIX) 40 MG tablet Take 1 tablet (40 mg total) by mouth daily for 30 days. 04/14/18 07/01/18  Tegeler, Gwenyth Allegra, MD  potassium chloride SA (K-DUR,KLOR-CON) 20 MEQ tablet Take 2 tablets (40 mEq total) by mouth 2 (two) times daily. 04/19/18   Recardo Evangelist, PA-C  spironolactone (ALDACTONE) 25 MG tablet Take 1 tablet (25 mg total) by mouth daily for 30 days. 06/27/18 07/27/18  Maudie Flakes, MD    Family History Family History  Problem Relation Age of Onset  . Arrhythmia Mother   . Neuropathy Mother   . Coronary artery disease Mother   . Heart disease Mother   . Heart disease Father   . Hypertension Father   . Heart attack Father   . Multiple sclerosis Maternal Aunt     Social History Social History   Tobacco Use  . Smoking status: Current Every Day Smoker    Packs/day: 1.00    Years: 36.00    Pack years: 36.00    Types: Cigarettes  . Smokeless tobacco: Never Used  Substance Use Topics  . Alcohol use: Yes    Comment: Chronic ETOH abuse  . Drug use: No     Allergies   Patient has no known allergies.   Review of Systems Review of Systems  All other systems reviewed and are negative.    Physical Exam Updated Vital Signs BP 113/79   Pulse (!) 103   Temp 97.7 F (36.5 C) (Oral)   Resp (!) 21   Ht '5\' 2"'  (1.575 m)   Wt 86.2 kg   SpO2 99%   BMI 34.76 kg/m   Physical Exam Vitals signs and nursing note reviewed.  Constitutional:      General: She is not in acute distress.    Appearance: She is well-developed. She is not ill-appearing, toxic-appearing or diaphoretic.     Comments: She is disheveled, wearing dirty clothing.  HENT:     Head: Normocephalic and atraumatic.     Right Ear: External ear normal.     Left Ear: External ear normal.     Mouth/Throat:     Pharynx: No pharyngeal swelling or oropharyngeal exudate.  Eyes:     Conjunctiva/sclera: Conjunctivae normal.     Pupils: Pupils are equal, round, and reactive to light.  Neck:     Musculoskeletal: Normal range of motion and neck supple.     Trachea: Phonation normal.  Cardiovascular:     Rate and Rhythm: Normal rate.  Pulmonary:     Effort: Pulmonary effort is normal.  Abdominal:     General: There is no distension.  Musculoskeletal:  Normal range of motion.  Skin:    General: Skin is warm and dry.  Neurological:     Mental Status: She is alert and oriented to  person, place, and time.     Cranial Nerves: No cranial nerve deficit.     Sensory: No sensory deficit.     Motor: No abnormal muscle tone.     Coordination: Coordination normal.     Comments: No dysarthria, or aphasia.  Psychiatric:        Behavior: Behavior normal.        Thought Content: Thought content normal.        Judgment: Judgment normal.     Comments: Somewhat anxious      ED Treatments / Results  Labs (all labs ordered are listed, but only abnormal results are displayed) Labs Reviewed  COMPREHENSIVE METABOLIC PANEL - Abnormal; Notable for the following components:      Result Value   Glucose, Bld 104 (*)    BUN <5 (*)    Creatinine, Ser 0.32 (*)    Calcium 7.5 (*)    Albumin 2.8 (*)    AST 72 (*)    Alkaline Phosphatase 144 (*)    Total Bilirubin 1.8 (*)    All other components within normal limits  CBC WITH DIFFERENTIAL/PLATELET - Abnormal; Notable for the following components:   WBC 3.0 (*)    Hemoglobin 11.1 (*)    MCH 24.2 (*)    MCHC 29.8 (*)    RDW 21.6 (*)    Neutro Abs 1.4 (*)    All other components within normal limits  ETHANOL - Abnormal; Notable for the following components:   Alcohol, Ethyl (B) 252 (*)    All other components within normal limits  LIPASE, BLOOD  AMMONIA     EKG None  Radiology No results found.  Procedures Procedures (including critical care time)  Medications Ordered in ED Medications - No data to display   Initial Impression / Assessment and Plan / ED Course  I have reviewed the triage vital signs and the nursing notes.  Pertinent labs & imaging results that were available during my care of the patient were reviewed by me and considered in my medical decision making (see chart for details).  Clinical Course as of Jul 10 1731  Mon Jul 10, 2018  1415 Normal  Lipase, blood [EW]   1415 Abnormal, high  Ethanol(!) [EW]  1415 Abnormal-white count low, hemoglobin low, RBC indices low  CBC with Differential(!) [EW]  1733 Normal except glucose high, BUN low, creatinine low, calcium low, albumin, AST high, alk phosphatase high, total bilirubin high  Comprehensive metabolic panel(!) [EW]  8891 normal  Ammonia [EW]    Clinical Course User Index [EW] Daleen Bo, MD        Patient Vitals for the past 24 hrs:  BP Temp Temp src Pulse Resp SpO2 Height Weight  07/10/18 1300 113/79 - - (!) 103 (!) 21 99 % - -  07/10/18 1256 100/74 - - (!) 103 16 99 % - -  07/10/18 1142 - - - - - - '5\' 2"'  (1.575 m) 86.2 kg  07/10/18 1140 120/76 97.7 F (36.5 C) Oral (!) 151 16 100 % - -    2:16 PM Reevaluation with update and discussion. After initial assessment and treatment, an updated evaluation reveals she is standing in the hall, at the nurses desk, demanding to leave.  She states that her sister is coming to get her.  I asked her to return to her room and told her to work on her discharge.  Findings discussed with the patient and all questions were answered. Daleen Bo  Medical Decision Making: Alcoholism with alcohol intoxication and medication noncompliance.  Patient desires to go home.  There is no evidence for acute alcohol withdrawal syndrome, evidence for hemodynamic instability or suggestion for unstable CNS process.  CRITICAL CARE-no Performed by: Daleen Bo  Nursing Notes Reviewed/ Care Coordinated Applicable Imaging Reviewed Interpretation of Laboratory Data incorporated into ED treatment  The patient appears reasonably screened and/or stabilized for discharge and I doubt any other medical condition or other [facility] requiring further screening, evaluation, or treatment in the ED at this time prior to discharge.  Plan: Home Medications-continue current medications; Home Treatments-avoid alcohol; return here if the recommended treatment, does not improve the symptoms;  Recommended follow up-PCP soon as possible.  Alcohol treatment center of choice as needed.   Final Clinical Impressions(s) / ED Diagnoses   Final diagnoses:  Alcoholic intoxication without complication [facility])    ED Discharge Orders    None       Daleen Bo, MD 07/10/18 1734

## 2018-07-10 NOTE — ED Notes (Signed)
Pt requesting to leave the hospital, reports she does not need to be here. This  Nurse notified EDP

## 2018-07-10 NOTE — ED Notes (Signed)
Pt given crackers and gingerale, tolerated well

## 2018-07-10 NOTE — ED Notes (Signed)
Pt becomingly increasingly agitated, security at bedside

## 2018-07-11 ENCOUNTER — Encounter (HOSPITAL_COMMUNITY): Payer: Self-pay | Admitting: Emergency Medicine

## 2018-07-11 ENCOUNTER — Other Ambulatory Visit: Payer: Self-pay

## 2018-07-11 ENCOUNTER — Emergency Department (HOSPITAL_COMMUNITY)
Admission: EM | Admit: 2018-07-11 | Discharge: 2018-07-11 | Disposition: A | Discharge status: Home / Self Care | Payer: Self-pay | Attending: Emergency Medicine | Admitting: Emergency Medicine

## 2018-07-11 DIAGNOSIS — F10929 Alcohol use, unspecified with intoxication, unspecified: Secondary | ICD-10-CM | POA: Insufficient documentation

## 2018-07-11 DIAGNOSIS — Z5321 Procedure and treatment not carried out due to patient leaving prior to being seen by health care provider: Secondary | ICD-10-CM | POA: Insufficient documentation

## 2018-07-11 NOTE — ED Notes (Signed)
Pt requesting discharge AMA signed by her.

## 2018-07-11 NOTE — Progress Notes (Signed)
CSW received telephone call from patient after she was discharged from the ED on 07/10/2018. Her speech was slurred and she stated several times that she could not hear CSW. Patient reports she is at home. Patient stated that she was told by the EDP that a FL2 would be submitted for her to get into [facility]. There were no notes or recommendations from EDP that patient would need SNF or care on that level. Patient also reports she needs help with her alcohol use. CSW explained to patient that she will need to address her alcohol use before she would be admitted into a facility. CSW also recommended that patient re-establish care with her PCP that she reports she has not seen in about a year.  CSW provided patient with number to [facility] in [place] to address her alcohol use. Patient then hung up the phone after CSW provided her with the number.   Geralyn Corwin, LCSW Transitions of Care Department [facility] ED [phone]

## 2018-07-11 NOTE — ED Triage Notes (Signed)
Pt comes to ED via EMS [place] for detox from ETOH use. Pt states last drink was last night but is vague on the time frame

## 2018-07-16 ENCOUNTER — Emergency Department (HOSPITAL_COMMUNITY)
Admission: EM | Admit: 2018-07-16 | Discharge: 2018-07-16 | Discharge status: Against Medical Advice | Payer: Self-pay | Attending: Emergency Medicine | Admitting: Emergency Medicine

## 2018-07-16 ENCOUNTER — Encounter (HOSPITAL_COMMUNITY): Payer: Self-pay | Admitting: Emergency Medicine

## 2018-07-16 ENCOUNTER — Encounter (HOSPITAL_COMMUNITY): Payer: Self-pay

## 2018-07-16 ENCOUNTER — Emergency Department (HOSPITAL_COMMUNITY)
Admission: EM | Admit: 2018-07-16 | Discharge: 2018-07-16 | Disposition: A | Discharge status: Home / Self Care | Payer: Self-pay | Attending: Emergency Medicine | Admitting: Emergency Medicine

## 2018-07-16 ENCOUNTER — Other Ambulatory Visit: Payer: Self-pay

## 2018-07-16 DIAGNOSIS — Z5321 Procedure and treatment not carried out due to patient leaving prior to being seen by health care provider: Secondary | ICD-10-CM | POA: Insufficient documentation

## 2018-07-16 DIAGNOSIS — M549 Dorsalgia, unspecified: Secondary | ICD-10-CM | POA: Insufficient documentation

## 2018-07-16 DIAGNOSIS — F17210 Nicotine dependence, cigarettes, uncomplicated: Secondary | ICD-10-CM | POA: Insufficient documentation

## 2018-07-16 DIAGNOSIS — F10920 Alcohol use, unspecified with intoxication, uncomplicated: Secondary | ICD-10-CM

## 2018-07-16 DIAGNOSIS — Z79899 Other long term (current) drug therapy: Secondary | ICD-10-CM | POA: Insufficient documentation

## 2018-07-16 DIAGNOSIS — I4891 Unspecified atrial fibrillation: Secondary | ICD-10-CM | POA: Insufficient documentation

## 2018-07-16 DIAGNOSIS — M7918 Myalgia, other site: Secondary | ICD-10-CM | POA: Insufficient documentation

## 2018-07-16 DIAGNOSIS — R52 Pain, unspecified: Secondary | ICD-10-CM | POA: Insufficient documentation

## 2018-07-16 DIAGNOSIS — F10229 Alcohol dependence with intoxication, unspecified: Secondary | ICD-10-CM | POA: Insufficient documentation

## 2018-07-16 DIAGNOSIS — Z5329 Procedure and treatment not carried out because of patient's decision for other reasons: Secondary | ICD-10-CM | POA: Insufficient documentation

## 2018-07-16 DIAGNOSIS — J449 Chronic obstructive pulmonary disease, unspecified: Secondary | ICD-10-CM | POA: Insufficient documentation

## 2018-07-16 LAB — CBC WITH DIFFERENTIAL/PLATELET
Abs Immature Granulocytes: 0.02 10*3/uL (ref 0.00–0.07)
Basophils Absolute: 0 10*3/uL (ref 0.0–0.1)
Basophils Relative: 1 %
Eosinophils Absolute: 0 10*3/uL (ref 0.0–0.5)
Eosinophils Relative: 1 %
HCT: 29.5 % — ABNORMAL LOW (ref 36.0–46.0)
Hemoglobin: 9.2 g/dL — ABNORMAL LOW (ref 12.0–15.0)
Immature Granulocytes: 1 %
Lymphocytes Relative: 8 %
Lymphs Abs: 0.3 10*3/uL — ABNORMAL LOW (ref 0.7–4.0)
MCH: 24.7 pg — ABNORMAL LOW (ref 26.0–34.0)
MCHC: 31.2 g/dL (ref 30.0–36.0)
MCV: 79.1 fL — ABNORMAL LOW (ref 80.0–100.0)
Monocytes Absolute: 0.5 10*3/uL (ref 0.1–1.0)
Monocytes Relative: 11 %
Neutro Abs: 3.5 10*3/uL (ref 1.7–7.7)
Neutrophils Relative %: 78 %
Platelets: 101 10*3/uL — ABNORMAL LOW (ref 150–400)
RBC: 3.73 MIL/uL — ABNORMAL LOW (ref 3.87–5.11)
RDW: 21.6 % — ABNORMAL HIGH (ref 11.5–15.5)
WBC: 4.4 10*3/uL (ref 4.0–10.5)
nRBC: 0 % (ref 0.0–0.2)

## 2018-07-16 LAB — COMPREHENSIVE METABOLIC PANEL
ALT: 24 U/L (ref 0–44)
AST: 62 U/L — ABNORMAL HIGH (ref 15–41)
Albumin: 2.7 g/dL — ABNORMAL LOW (ref 3.5–5.0)
Alkaline Phosphatase: 163 U/L — ABNORMAL HIGH (ref 38–126)
Anion gap: 13 (ref 5–15)
BUN: 5 mg/dL — ABNORMAL LOW (ref 6–20)
CO2: 22 mmol/L (ref 22–32)
Calcium: 7.6 mg/dL — ABNORMAL LOW (ref 8.9–10.3)
Chloride: 89 mmol/L — ABNORMAL LOW (ref 98–111)
Creatinine, Ser: 0.39 mg/dL — ABNORMAL LOW (ref 0.44–1.00)
GFR calc Af Amer: >60 mL/min (ref >60)
GFR calc non Af Amer: >60 mL/min (ref >60)
Glucose, Bld: 100 mg/dL — ABNORMAL HIGH (ref 70–99)
Potassium: 3.3 mmol/L — ABNORMAL LOW (ref 3.5–5.1)
Sodium: 124 mmol/L — ABNORMAL LOW (ref 135–145)
Total Bilirubin: 3 mg/dL — ABNORMAL HIGH (ref 0.3–1.2)
Total Protein: 6.6 g/dL (ref 6.5–8.1)

## 2018-07-16 LAB — ETHANOL: Alcohol, Ethyl (B): 31 mg/dL — ABNORMAL HIGH (ref <10)

## 2018-07-16 MED ORDER — LORAZEPAM 1 MG PO TABS: 2.0000 mg | ORAL_TABLET | Freq: Once | ORAL | Status: DC

## 2018-07-16 MED ORDER — LORAZEPAM 1 MG PO TABS
1.0000 mg | ORAL_TABLET | Freq: Once | ORAL | Status: AC
  Administered 2018-07-16: 1 mg via ORAL
  Filled 2018-07-16: qty 1

## 2018-07-16 MED ORDER — ACETAMINOPHEN 325 MG PO TABS
650.0000 mg | ORAL_TABLET | Freq: Once | ORAL | Status: AC
  Administered 2018-07-16: 650 mg via ORAL
  Filled 2018-07-16: qty 2

## 2018-07-16 NOTE — ED Notes (Signed)
ED Provider at bedside. 

## 2018-07-16 NOTE — ED Notes (Signed)
Patient is demanding wheel chair and now patient is wanting to leaving.

## 2018-07-16 NOTE — ED Triage Notes (Signed)
Pt presents by [facility] for evaluation of chronic back pain that she was seen at this facility twice today for same complaint.
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
## 2018-07-16 NOTE — ED Provider Notes (Signed)
[facility]-EMERGENCY DEPT Provider Note   CSN: [id number] Arrival date & time: 07/16/18  1408    History   Chief Complaint Chief Complaint  Patient presents with  . Alcohol Intoxication  . MS Pain    HPI Anne Black is a 58 y.o. female.     58 year old female with history of alcoholism as well as MS presents planing of diffuse whole body pain.  Patient seen multiple times the past for similar symptoms.  Questionable use of alcohol today.  Patient states he drinks a bottle of wine a day and last drink was yesterday.  Does note some tremor.  Does have a history of alcoholic cirrhosis.  Denies any fever or chills.  No cough or congestion.  Has been medicating with Neurontin without relief.  Was the ED several hours ago but left eloped     Past Medical History:  Diagnosis Date  . ADHD (attention deficit hyperactivity disorder)   . Alcoholic cirrhosis (HCC)    with ascites  . Alcoholism (HCC)   . Allergies   . Anxiety   . Arthritis   . Atrial fibrillation with RVR (HCC)   . Bipolar disorder (HCC)   . COPD (chronic obstructive pulmonary disease) (HCC)    wears 2L chronic O2  . Dyspnea   . EtOH dependence (HCC) 05/14/2018  . GERD (gastroesophageal reflux disease)   . History of hiatal hernia   . Hypokalemia   . Migraine   . Multiple sclerosis (HCC)   . Narcolepsy   . Osteoarthritis of knee   . Osteoporosis   . Pneumonia     Patient Active Problem List   Diagnosis Date Noted  . Cellulitis of lower extremity   . Peripheral edema   . Atrial fibrillation with RVR (HCC) 06/13/2018  . Alcohol dependence (HCC) 05/06/2018  . Electrolyte disturbance 05/05/2018  . Alcohol withdrawal delirium (HCC) 04/28/2018  . Weakness generalized 04/27/2018  . Gait abnormality 04/24/2018  . Chronic hypoxemic respiratory failure (HCC) 04/11/2018  . Atrial fibrillation with rapid ventricular response (HCC) 03/23/2018  . Head injury   . Fall 03/19/2018  . Syncope  03/19/2018  . Orbital fracture 03/19/2018  . Scalp laceration 03/19/2018  . Acute respiratory failure with hypoxia and hypercarbia (HCC) 03/19/2018  . Abdominal tenderness 03/19/2018  . Acute on chronic respiratory failure with hypoxia (HCC) 03/17/2018  . Alcohol abuse with intoxication (HCC) 03/17/2018  . ETOH abuse   . Normocytic anemia 03/16/2018  . Acute metabolic encephalopathy 03/16/2018  . Lower extremity edema 03/16/2018  . Migraine 03/16/2018  . Community acquired pneumonia of left lower lobe of lung (HCC)   . Weakness 08/21/2017  . COPD exacerbation (HCC) 06/12/2017  . Chest pain with moderate risk for cardiac etiology 05/30/2017  . Abnormal LFTs 05/30/2017  . Tachycardia 05/30/2017  . COPD (chronic obstructive pulmonary disease) (HCC) 04/19/2017  . Right rib fracture 04/15/2017  . Abdominal wall cellulitis 12/30/2016  . S/P exploratory laparotomy 12/30/2016  . Distal radius fracture, left 09/09/2016  . Thrombocytopenia (HCC) 09/09/2016  . Chest x-ray abnormality   . Umbilical hernia without obstruction and without gangrene 05/12/2016  . Itching 02/24/2016  . Ventral hernia without obstruction or gangrene 02/24/2016  . Palliative care encounter   . Ascites   . Tachypnea   . Atrial flutter with rapid ventricular response (HCC)   . Hypomagnesemia   . Hypoxia   . Dehydration 01/22/2016  . Low back ache 12/30/2015  . Non-intractable vomiting with nausea   .  Alcohol abuse with alcohol-induced mood disorder ([place]) 12/15/2015  . Acute alcoholism ([place])   . Distended abdomen 11/04/2015  . Alcohol abuse   . Dyspnea   . Acute respiratory failure ([place]) 10/15/2015  . Ascites due to alcoholic cirrhosis ([place]) 38/25/0539  . Osteoporosis   . Multiple sclerosis ([place])   . Alcoholic cirrhosis of liver with ascites ([place]) 08/22/2015  . Bipolar disorder, current episode mixed, moderate ([place]) 08/01/2015  . Alcohol use disorder, severe, dependence ([place]) 07/31/2015  . Macrocytic  anemia- due to alcohol abuse with normal B12 & folate levels 05/31/2015  . Severe protein-calorie malnutrition ([place]) 05/31/2015  . Hypokalemia 05/26/2015  . Encephalopathy, hepatic ([place]) 05/26/2015  . Alcohol withdrawal ([place]) 05/18/2015  . Abdominal pain 05/18/2015  . Anxiety 05/18/2015  . Stimulant abuse ([place]) 12/27/2014  . Nicotine abuse 12/27/2014  . Hyperprolactinemia ([place]) 11/15/2014  . Dyslipidemia   . Alcohol use disorder, moderate, dependence ([place]) 10/30/2014  . Tobacco abuse 01/23/2014  . Bipolar 1 disorder ([place]) 04/07/2013  . Noncompliance with therapeutic plan 04/04/2013  . Hereditary and idiopathic peripheral neuropathy 05/01/2012  . Depression 05/01/2012  . GERD (gastroesophageal reflux disease) 05/01/2012  . Osteoarthrosis, unspecified whether generalized or localized, involving lower leg 05/01/2012  . Hyponatremia 07/01/2011    Past Surgical History:  Procedure Laterality Date  . ABDOMINAL WALL DEFECT REPAIR N/A 12/30/2016   Procedure: EXPLORATORY LAPAROTOMY WITH REPAIR ABDOMINAL WALL VENTRAL HERNIA;  Surgeon: Rolm Bookbinder, MD;  Location: [facility];  Service: General;  Laterality: N/A;  . APPLICATION OF WOUND VAC N/A 12/30/2016   Procedure: APPLICATION OF WOUND VAC;  Surgeon: Rolm Bookbinder, MD;  Location: [facility];  Service: General;  Laterality: N/A;  . CESAREAN SECTION  [phone]  . FRACTURE SURGERY    . HERNIA REPAIR    . IR PARACENTESIS  06/14/2018  . MYRINGOTOMY WITH TUBE PLACEMENT Bilateral   . ORIF WRIST FRACTURE Left 09/09/2016   Procedure: OPEN REDUCTION INTERNAL FIXATION (ORIF) LEFT WRIST FRACTURE, LEFT CARPAL TUNNEL RELEASE;  Surgeon: Roseanne Kaufman, MD;  Location: [facility];  Service: Orthopedics;  Laterality: Left;  . TONSILLECTOMY       OB History   No obstetric history on file.      Home Medications    Prior to Admission medications   Medication Sig Start Date End Date Taking? Authorizing Provider  acetaminophen (TYLENOL) 500 MG tablet Take  1 tablet (500 mg total) by mouth every 6 (six) hours as needed for mild pain. Patient taking differently: Take 1,000 mg by mouth every 6 (six) hours as needed for mild pain.  03/28/18   Lavina Hamman, MD  diltiazem (CARDIZEM CD) 120 MG 24 hr capsule Take 1 capsule (120 mg total) by mouth daily for 30 days. 04/14/18 07/01/18  Tegeler, Gwenyth Allegra, MD  furosemide (LASIX) 40 MG tablet Take 1 tablet (40 mg total) by mouth daily. 06/27/18   Maudie Flakes, MD  gabapentin (NEURONTIN) 100 MG capsule Take 2 capsules (200 mg total) by mouth 3 (three) times daily for 30 days. 05/19/18 07/01/18  Lacretia Leigh, MD  hydrOXYzine (ATARAX/VISTARIL) 25 MG tablet Take 1 tablet (25 mg total) by mouth daily as needed for up to 30 doses (anxiety). Patient taking differently: Take 25 mg by mouth daily as needed for anxiety (withdrawal).  04/14/18   Tegeler, Gwenyth Allegra, MD  magnesium oxide (MAG-OX) 400 MG tablet Take 400 mg by mouth daily.    [provider]  methocarbamol (ROBAXIN) 500 MG tablet Take 1 tablet (500  mg total) by mouth 2 (two) times daily as needed for muscle spasms. 06/27/18   Sabas SousBero, Michael M, MD  ondansetron (ZOFRAN) 4 MG tablet Take 4 mg by mouth every 8 (eight) hours as needed for nausea or vomiting.    [provider]  pantoprazole (PROTONIX) 40 MG tablet Take 1 tablet (40 mg total) by mouth daily for 30 days. 04/14/18 07/01/18  Tegeler, Canary Brimhristopher J, MD  potassium chloride SA (K-DUR,KLOR-CON) 20 MEQ tablet Take 2 tablets (40 mEq total) by mouth 2 (two) times daily. 04/19/18   [place]hel BornGekas, Kelly Marie, PA-C  spironolactone (ALDACTONE) 25 MG tablet Take 1 tablet (25 mg total) by mouth daily for 30 days. 06/27/18 07/27/18  Sabas SousBero, Michael M, MD    Family History Family History  Problem Relation Age of Onset  . Arrhythmia Mother   . Neuropathy Mother   . Coronary artery disease Mother   . Heart disease Mother   . Heart disease Father   . Hypertension Father   . Heart attack Father   . Multiple  sclerosis Maternal Aunt     Social History Social History   Tobacco Use  . Smoking status: Current Every Day Smoker    Packs/day: 1.00    Years: 36.00    Pack years: 36.00    Types: Cigarettes  . Smokeless tobacco: Never Used  Substance Use Topics  . Alcohol use: Yes    Comment: Chronic ETOH abuse  . Drug use: No     Allergies   Patient has no known allergies.   Review of Systems Review of Systems  All other systems reviewed and are negative.    Physical Exam Updated Vital Signs BP 137/68 (BP Location: Right Arm)   Temp 98.7 F (37.1 C) (Oral)   Resp 18   Ht 1.575 m (5\' 2" )   Wt 86.2 kg   SpO2 95%   BMI 34.76 kg/m   Physical Exam Vitals signs and nursing note reviewed.  Constitutional:      General: She is not in acute distress.    Appearance: Normal appearance. She is well-developed. She is not toxic-appearing.  HENT:     Head: Normocephalic and atraumatic.  Eyes:     General: Lids are normal.     Conjunctiva/sclera: Conjunctivae normal.     Pupils: Pupils are equal, round, and reactive to light.  Neck:     Musculoskeletal: Normal range of motion and neck supple.     Thyroid: No thyroid mass.     Trachea: No tracheal deviation.  Cardiovascular:     Rate and Rhythm: Normal rate and regular rhythm.     Heart sounds: Normal heart sounds. No murmur. No gallop.   Pulmonary:     Effort: Pulmonary effort is normal. No respiratory distress.     Breath sounds: Normal breath sounds. No stridor. No decreased breath sounds, wheezing, rhonchi or rales.  Abdominal:     General: Bowel sounds are normal. There is no distension.     Palpations: Abdomen is soft.     Tenderness: There is no abdominal tenderness. There is no rebound.  Musculoskeletal: Normal range of motion.        General: No tenderness.  Skin:    General: Skin is warm and dry.     Findings: No abrasion or rash.  Neurological:     Mental Status: She is alert and oriented to person, place, and  time.     GCS: GCS eye subscore is 4. GCS verbal subscore  is 5. GCS motor subscore is 6.     Cranial Nerves: No cranial nerve deficit.     Sensory: No sensory deficit.     Motor: Tremor present.     Comments: Strength is 5 of 5 in all 4 extremities  Psychiatric:        Speech: Speech normal.        Behavior: Behavior normal.      ED Treatments / Results  Labs (all labs ordered are listed, but only abnormal results are displayed) Labs Reviewed  ETHANOL  URINALYSIS, ROUTINE W REFLEX MICROSCOPIC  CBC WITH DIFFERENTIAL/PLATELET  COMPREHENSIVE METABOLIC PANEL    EKG None  Radiology No results found.  Procedures Procedures (including critical care time)  Medications Ordered in ED Medications  acetaminophen (TYLENOL) tablet 650 mg (has no administration in time range)     Initial Impression / Assessment and Plan / ED Course  I have reviewed the triage vital signs and the nursing notes.  Pertinent labs & imaging results that were available during my care of the patient were reviewed by me and considered in my medical decision making (see chart for details).        Pt eloped from the dept prior to labs resulting  Final Clinical Impressions(s) / ED Diagnoses   Final diagnoses:  None    ED Discharge Orders    None       Lorre NickAllen, Merridith Dershem, MD 07/18/18 [id number]

## 2018-07-16 NOTE — ED Notes (Signed)
Bed: WA17 Expected date:  Expected time:  Means of arrival:  Comments: ETOH

## 2018-07-16 NOTE — ED Triage Notes (Signed)
Pt BIBA from home. Pt states that she has not had a drink since yesterday and is concerned for pain. Pt is also demanding pain medicine.

## 2018-07-17 ENCOUNTER — Emergency Department (HOSPITAL_COMMUNITY)
Admission: EM | Admit: 2018-07-17 | Discharge: 2018-07-17 | Disposition: A | Discharge status: Home / Self Care | Payer: Self-pay | Attending: Emergency Medicine | Admitting: Emergency Medicine

## 2018-07-19 ENCOUNTER — Emergency Department (HOSPITAL_COMMUNITY)
Admission: EM | Admit: 2018-07-19 | Discharge: 2018-07-19 | Disposition: A | Discharge status: Home / Self Care | Payer: Self-pay | Attending: Emergency Medicine | Admitting: Emergency Medicine

## 2018-07-19 ENCOUNTER — Encounter (HOSPITAL_COMMUNITY): Payer: Self-pay | Admitting: Emergency Medicine

## 2018-07-19 ENCOUNTER — Emergency Department (HOSPITAL_COMMUNITY): Payer: Self-pay

## 2018-07-19 ENCOUNTER — Other Ambulatory Visit: Payer: Self-pay

## 2018-07-19 DIAGNOSIS — R5383 Other fatigue: Secondary | ICD-10-CM | POA: Insufficient documentation

## 2018-07-19 DIAGNOSIS — F909 Attention-deficit hyperactivity disorder, unspecified type: Secondary | ICD-10-CM | POA: Insufficient documentation

## 2018-07-19 DIAGNOSIS — Z79899 Other long term (current) drug therapy: Secondary | ICD-10-CM | POA: Insufficient documentation

## 2018-07-19 DIAGNOSIS — I4891 Unspecified atrial fibrillation: Secondary | ICD-10-CM | POA: Insufficient documentation

## 2018-07-19 DIAGNOSIS — Y906 Blood alcohol level of 120-199 mg/100 ml: Secondary | ICD-10-CM | POA: Insufficient documentation

## 2018-07-19 DIAGNOSIS — R609 Edema, unspecified: Secondary | ICD-10-CM | POA: Insufficient documentation

## 2018-07-19 DIAGNOSIS — F1010 Alcohol abuse, uncomplicated: Secondary | ICD-10-CM | POA: Insufficient documentation

## 2018-07-19 DIAGNOSIS — J449 Chronic obstructive pulmonary disease, unspecified: Secondary | ICD-10-CM | POA: Insufficient documentation

## 2018-07-19 DIAGNOSIS — F17210 Nicotine dependence, cigarettes, uncomplicated: Secondary | ICD-10-CM | POA: Insufficient documentation

## 2018-07-19 DIAGNOSIS — Z760 Encounter for issue of repeat prescription: Secondary | ICD-10-CM | POA: Insufficient documentation

## 2018-07-19 DIAGNOSIS — K7031 Alcoholic cirrhosis of liver with ascites: Secondary | ICD-10-CM | POA: Insufficient documentation

## 2018-07-19 DIAGNOSIS — G35 Multiple sclerosis: Secondary | ICD-10-CM | POA: Insufficient documentation

## 2018-07-19 LAB — URINALYSIS, ROUTINE W REFLEX MICROSCOPIC
Bilirubin Urine: NEGATIVE
Glucose, UA: NEGATIVE mg/dL
Ketones, ur: 5 mg/dL — AB
Leukocytes,Ua: NEGATIVE
Nitrite: NEGATIVE
Protein, ur: NEGATIVE mg/dL
Specific Gravity, Urine: 1.009 (ref 1.005–1.030)
pH: 6 (ref 5.0–8.0)

## 2018-07-19 LAB — ETHANOL: Alcohol, Ethyl (B): 192 mg/dL — ABNORMAL HIGH (ref <10)

## 2018-07-19 LAB — CBC WITH DIFFERENTIAL/PLATELET
Abs Immature Granulocytes: 0.02 10*3/uL (ref 0.00–0.07)
Basophils Absolute: 0 10*3/uL (ref 0.0–0.1)
Basophils Relative: 1 %
Eosinophils Absolute: 0.1 10*3/uL (ref 0.0–0.5)
Eosinophils Relative: 2 %
HCT: 29 % — ABNORMAL LOW (ref 36.0–46.0)
Hemoglobin: 8.9 g/dL — ABNORMAL LOW (ref 12.0–15.0)
Immature Granulocytes: 0 %
Lymphocytes Relative: 15 %
Lymphs Abs: 0.8 10*3/uL (ref 0.7–4.0)
MCH: 24.6 pg — ABNORMAL LOW (ref 26.0–34.0)
MCHC: 30.7 g/dL (ref 30.0–36.0)
MCV: 80.1 fL (ref 80.0–100.0)
Monocytes Absolute: 0.7 10*3/uL (ref 0.1–1.0)
Monocytes Relative: 13 %
Neutro Abs: 3.7 10*3/uL (ref 1.7–7.7)
Neutrophils Relative %: 69 %
Platelets: 110 10*3/uL — ABNORMAL LOW (ref 150–400)
RBC: 3.62 MIL/uL — ABNORMAL LOW (ref 3.87–5.11)
RDW: 22.1 % — ABNORMAL HIGH (ref 11.5–15.5)
WBC: 5.3 10*3/uL (ref 4.0–10.5)
nRBC: 0 % (ref 0.0–0.2)

## 2018-07-19 LAB — COMPREHENSIVE METABOLIC PANEL
ALT: 23 U/L (ref 0–44)
AST: 60 U/L — ABNORMAL HIGH (ref 15–41)
Albumin: 2.3 g/dL — ABNORMAL LOW (ref 3.5–5.0)
Alkaline Phosphatase: 135 U/L — ABNORMAL HIGH (ref 38–126)
Anion gap: 11 (ref 5–15)
BUN: <5 mg/dL — ABNORMAL LOW (ref 6–20)
CO2: 29 mmol/L (ref 22–32)
Calcium: 7.4 mg/dL — ABNORMAL LOW (ref 8.9–10.3)
Chloride: 90 mmol/L — ABNORMAL LOW (ref 98–111)
Creatinine, Ser: 0.66 mg/dL (ref 0.44–1.00)
GFR calc Af Amer: >60 mL/min (ref >60)
GFR calc non Af Amer: >60 mL/min (ref >60)
Glucose, Bld: 103 mg/dL — ABNORMAL HIGH (ref 70–99)
Potassium: 2.7 mmol/L — CL (ref 3.5–5.1)
Sodium: 130 mmol/L — ABNORMAL LOW (ref 135–145)
Total Bilirubin: 2.6 mg/dL — ABNORMAL HIGH (ref 0.3–1.2)
Total Protein: 6.1 g/dL — ABNORMAL LOW (ref 6.5–8.1)

## 2018-07-19 LAB — RAPID URINE DRUG SCREEN, HOSP PERFORMED
Amphetamines: NOT DETECTED
Barbiturates: NOT DETECTED
Benzodiazepines: POSITIVE — AB
Cocaine: NOT DETECTED
Opiates: NOT DETECTED
Tetrahydrocannabinol: NOT DETECTED

## 2018-07-19 LAB — LIPASE, BLOOD: Lipase: 35 U/L (ref 11–51)

## 2018-07-19 MED ORDER — POTASSIUM CHLORIDE CRYS ER 20 MEQ PO TBCR
40.0000 meq | EXTENDED_RELEASE_TABLET | Freq: Once | ORAL | Status: AC
  Administered 2018-07-19: 16:00:00 40 meq via ORAL
  Filled 2018-07-19: qty 2

## 2018-07-19 MED ORDER — IOHEXOL 300 MG/ML  SOLN
100.0000 mL | Freq: Once | INTRAMUSCULAR | Status: AC | PRN
  Administered 2018-07-19: 100 mL via INTRAVENOUS

## 2018-07-19 MED ORDER — GABAPENTIN 100 MG PO CAPS
100.0000 mg | ORAL_CAPSULE | Freq: Once | ORAL | Status: AC
  Administered 2018-07-19: 100 mg via ORAL
  Filled 2018-07-19: qty 1

## 2018-07-19 MED ORDER — PANTOPRAZOLE SODIUM 40 MG PO TBEC: 40.0000 mg | DELAYED_RELEASE_TABLET | Freq: Every day | ORAL | 0 refills | Status: DC

## 2018-07-19 MED ORDER — POTASSIUM CHLORIDE 10 MEQ/100ML IV SOLN
10.0000 meq | Freq: Once | INTRAVENOUS | Status: AC
  Administered 2018-07-19: 10 meq via INTRAVENOUS
  Filled 2018-07-19: qty 100

## 2018-07-19 MED ORDER — GABAPENTIN 100 MG PO CAPS: 200.0000 mg | ORAL_CAPSULE | Freq: Three times a day (TID) | ORAL | 0 refills | Status: AC

## 2018-07-19 NOTE — ED Notes (Signed)
Pt ambulatory to the bathroom 

## 2018-07-19 NOTE — Discharge Instructions (Addendum)
I have refill your neurotin.  It is important that you find a primary care provider and follow up for further care.  Use resources below. Avoid alcohol use as it is harmful for your  health.  You will also benefit from paracentesis procedure to have fluid drawn off your abdomen. Discuss this with your doctor.

## 2018-07-19 NOTE — ED Notes (Signed)
Pts daughter has been called to come pick pt up for discharge. pts daughter will be here within the next hour.

## 2018-07-19 NOTE — ED Notes (Signed)
This RN spoke with the daughter Garnet Koyanagi who is currently outside. Pt wheeled out by chair. Nutrition food tray sent with patient, along with d/c prescriptions and paperwork.   Pt verbalized understanding of discharge instructions and denies any further questions at this time.

## 2018-07-19 NOTE — ED Triage Notes (Signed)
[facility]- pt here for alcohol withdraw x2 weeks. Pt has history of this condition. No other complaints at this time.   BP 98/75 CBG 111 HR 82 Temp 97.9

## 2018-07-19 NOTE — ED Notes (Signed)
Notified PA of pts critical potassium of 2.7

## 2018-07-19 NOTE — ED Provider Notes (Signed)
[facility]   CSN: [id number] Arrival date & time: 07/19/18  1208    History   Chief Complaint No chief complaint on file.   HPI Anne Black is a 58 y.o. female.     The history is provided by the patient and medical records. No language interpreter was used.     58 year old female with known history of alcohol abuse, atrial fibrillation, COPD, bipolar, polysubstance abuse, MS brought here via EMS with request of refill of her Neurontin.  Patient is a poor historian therefore difficult to obtain history.  She mentioned she ran out of her Neurontin for the past several days.  She takes it for peripheral neuropathy.  She now complaining of body aches and pain without her medication.  She also mentioned that she is going through alcohol withdrawal.  States that her last alcohol use was yesterday.  She normally drinks about 4 Styrofoam cups of wine daily.  She states she has been able to drive and therefore have not had a chance to follow-up with her doctor.  Endorse generalized fatigue.  Complains of dizziness.  Report aches and pains throughout body.  Does not report nausea vomiting or diarrhea or fever.  Denies productive cough.  History is limited.  Patient admits that she has abdominal ascites from her alcohol use and liver cirrhosis.  She does not recall the last paracentesis.  Past Medical History:  Diagnosis Date   ADHD (attention deficit hyperactivity disorder)    Alcoholic cirrhosis (HCC)    with ascites   Alcoholism (HCC)    Allergies    Anxiety    Arthritis    Atrial fibrillation with RVR (HCC)    Bipolar disorder (HCC)    COPD (chronic obstructive pulmonary disease) (HCC)    wears 2L chronic O2   Dyspnea    EtOH dependence (HCC) 05/14/2018   GERD (gastroesophageal reflux disease)    History of hiatal hernia    Hypokalemia    Migraine    Multiple sclerosis (HCC)    Narcolepsy     Osteoarthritis of knee    Osteoporosis    Pneumonia     Patient Active Problem List   Diagnosis Date Noted   Cellulitis of lower extremity    Peripheral edema    Atrial fibrillation with RVR (HCC) 06/13/2018   Alcohol dependence (HCC) 05/06/2018   Electrolyte disturbance 05/05/2018   Alcohol withdrawal delirium (HCC) 04/28/2018   Weakness generalized 04/27/2018   Gait abnormality 04/24/2018   Chronic hypoxemic respiratory failure (HCC) 04/11/2018   Atrial fibrillation with rapid ventricular response (HCC) 03/23/2018   Head injury    Fall 03/19/2018   Syncope 03/19/2018   Orbital fracture 03/19/2018   Scalp laceration 03/19/2018   Acute respiratory failure with hypoxia and hypercarbia (HCC) 03/19/2018   Abdominal tenderness 03/19/2018   Acute on chronic respiratory failure with hypoxia (HCC) 03/17/2018   Alcohol abuse with intoxication (HCC) 03/17/2018   ETOH abuse    Normocytic anemia 03/16/2018   Acute metabolic encephalopathy 03/16/2018   Lower extremity edema 03/16/2018   Migraine 03/16/2018   Community acquired pneumonia of left lower lobe of lung (HCC)    Weakness 08/21/2017   COPD exacerbation (HCC) 06/12/2017   Chest pain with moderate risk for cardiac etiology 05/30/2017   Abnormal LFTs 05/30/2017   Tachycardia 05/30/2017   COPD (chronic obstructive pulmonary disease) (HCC) 04/19/2017   Right rib fracture 04/15/2017   Abdominal wall cellulitis 12/30/2016  S/P exploratory laparotomy 12/30/2016   Distal radius fracture, left 09/09/2016   Thrombocytopenia (HCC) 09/09/2016   Chest x-ray abnormality    Umbilical hernia without obstruction and without gangrene 05/12/2016   Itching 02/24/2016   Ventral hernia without obstruction or gangrene 02/24/2016   Palliative care encounter    Ascites    Tachypnea    Atrial flutter with rapid ventricular response (HCC)    Hypomagnesemia    Hypoxia    Dehydration 01/22/2016     Low back ache 12/30/2015   Non-intractable vomiting with nausea    Alcohol abuse with alcohol-induced mood disorder (HCC) 12/15/2015   Acute alcoholism (HCC)    Distended abdomen 11/04/2015   Alcohol abuse    Dyspnea    Acute respiratory failure (HCC) 10/15/2015   Ascites due to alcoholic cirrhosis (HCC) 10/15/2015   Osteoporosis    Multiple sclerosis (HCC)    Alcoholic cirrhosis of liver with ascites (HCC) 08/22/2015   Bipolar disorder, current episode mixed, moderate (HCC) 08/01/2015   Alcohol use disorder, severe, dependence (HCC) 07/31/2015   Macrocytic anemia- due to alcohol abuse with normal B12 & folate levels 05/31/2015   Severe protein-calorie malnutrition (HCC) 05/31/2015   Hypokalemia 05/26/2015   Encephalopathy, hepatic (HCC) 05/26/2015   Alcohol withdrawal (HCC) 05/18/2015   Abdominal pain 05/18/2015   Anxiety 05/18/2015   Stimulant abuse (HCC) 12/27/2014   Nicotine abuse 12/27/2014   Hyperprolactinemia (HCC) 11/15/2014   Dyslipidemia    Alcohol use disorder, moderate, dependence (HCC) 10/30/2014   Tobacco abuse 01/23/2014   Bipolar 1 disorder (HCC) 04/07/2013   Noncompliance with therapeutic plan 04/04/2013   Hereditary and idiopathic peripheral neuropathy 05/01/2012   Depression 05/01/2012   GERD (gastroesophageal reflux disease) 05/01/2012   Osteoarthrosis, unspecified whether generalized or localized, involving lower leg 05/01/2012   Hyponatremia 07/01/2011    Past Surgical History:  Procedure Laterality Date   ABDOMINAL WALL DEFECT REPAIR N/A 12/30/2016   Procedure: EXPLORATORY LAPAROTOMY WITH REPAIR ABDOMINAL WALL VENTRAL HERNIA;  Surgeon: Emelia LoronWakefield, Matthew, MD;  [facility];  Service: General;  Laterality: N/A;   APPLICATION OF WOUND VAC N/A 12/30/2016   Procedure: APPLICATION OF WOUND VAC;  Surgeon: Emelia LoronWakefield, Matthew, MD;  Location: [facility] OR;  Service: General;  Laterality: N/A;   CESAREAN SECTION  [phone]    FRACTURE SURGERY     HERNIA REPAIR     IR PARACENTESIS  06/14/2018   MYRINGOTOMY WITH TUBE PLACEMENT Bilateral    ORIF WRIST FRACTURE Left 09/09/2016   Procedure: OPEN REDUCTION INTERNAL FIXATION (ORIF) LEFT WRIST FRACTURE, LEFT CARPAL TUNNEL RELEASE;  Surgeon: Dominica SeverinGramig, William, MD;  Location: [facility] ORS;  Service: Orthopedics;  Laterality: Left;   TONSILLECTOMY       OB History   No obstetric history on file.      Home Medications    Prior to Admission medications   Medication Sig Start Date End Date Taking? Authorizing Provider  acetaminophen (TYLENOL) 500 MG tablet Take 1 tablet (500 mg total) by mouth every 6 (six) hours as needed for mild pain. Patient taking differently: Take 1,000 mg by mouth every 6 (six) hours as needed for mild pain.  03/28/18   Rolly SalterPatel, Pranav M, MD  diltiazem (CARDIZEM CD) 120 MG 24 hr capsule Take 1 capsule (120 mg total) by mouth daily for 30 days. 04/14/18 07/01/18  Tegeler, Canary Brimhristopher J, MD  furosemide (LASIX) 40 MG tablet Take 1 tablet (40 mg total) by mouth daily. 06/27/18   Sabas SousBero, Michael M, MD  gabapentin (NEURONTIN) 100  MG capsule Take 2 capsules (200 mg total) by mouth 3 (three) times daily for 30 days. 05/19/18 07/01/18  Lorre Nick, MD  hydrOXYzine (ATARAX/VISTARIL) 25 MG tablet Take 1 tablet (25 mg total) by mouth daily as needed for up to 30 doses (anxiety). Patient taking differently: Take 25 mg by mouth daily as needed for anxiety (withdrawal).  04/14/18   Tegeler, Canary Brim, MD  magnesium oxide (MAG-OX) 400 MG tablet Take 400 mg by mouth daily.    [provider]  methocarbamol (ROBAXIN) 500 MG tablet Take 1 tablet (500 mg total) by mouth 2 (two) times daily as needed for muscle spasms. 06/27/18   Sabas Sous, MD  ondansetron (ZOFRAN) 4 MG tablet Take 4 mg by mouth every 8 (eight) hours as needed for nausea or vomiting.    [provider]  pantoprazole (PROTONIX) 40 MG tablet Take 1 tablet (40 mg total) by mouth daily for 30  days. 04/14/18 07/01/18  Tegeler, Canary Brim, MD  potassium chloride SA (K-DUR,KLOR-CON) 20 MEQ tablet Take 2 tablets (40 mEq total) by mouth 2 (two) times daily. 04/19/18   Bethel Born, PA-C  spironolactone (ALDACTONE) 25 MG tablet Take 1 tablet (25 mg total) by mouth daily for 30 days. 06/27/18 07/27/18  Sabas Sous, MD    Family History Family History  Problem Relation Age of Onset   Arrhythmia Mother    Neuropathy Mother    Coronary artery disease Mother    Heart disease Mother    Heart disease Father    Hypertension Father    Heart attack Father    Multiple sclerosis Maternal Aunt     Social History Social History   Tobacco Use   Smoking status: Current Every Day Smoker    Packs/day: 1.00    Years: 36.00    Pack years: 36.00    Types: Cigarettes   Smokeless tobacco: Never Used  Substance Use Topics   Alcohol use: Yes    Comment: Chronic ETOH abuse   Drug use: No     Allergies   Patient has no known allergies.   Review of Systems Review of Systems  All other systems reviewed and are negative.    Physical Exam Updated Vital Signs BP 116/62 (BP Location: Left Arm)    Pulse 76    Temp 97.9 F (36.6 C) (Oral)    Resp 16    Ht  (1.575 m)    Wt 86.2 kg    SpO2 100%    BMI 34.76 kg/m   Physical Exam Vitals signs and nursing note reviewed.  Constitutional:      General: She is not in acute distress.    Appearance: She is well-developed. She is ill-appearing.     Comments: Patient appears to be chronically ill-appearing  HENT:     Head: Normocephalic and atraumatic.     Mouth/Throat:     Mouth: Mucous membranes are dry.  Eyes:     Extraocular Movements: Extraocular movements intact.     Conjunctiva/sclera: Conjunctivae normal.     Pupils: Pupils are equal, round, and reactive to light.  Neck:     Musculoskeletal: Neck supple. No neck rigidity.  Cardiovascular:     Rate and Rhythm: Normal rate and regular rhythm.  Pulmonary:      Breath sounds: Rhonchi and rales present. No wheezing.  Abdominal:     General: There is distension.     Comments: Tense abdominal anxiety without significant tenderness to palpation.  Musculoskeletal:        General: Swelling (2+ pitting edema to bilateral lower extremities with surrounding skin erythema consistent with chronic venous stasis.) present.  Skin:    Findings: Bruising (Patient with multiple skin bruises throughout body including a moderate sized ecchymosis to right lower back) present. No rash.  Neurological:     Mental Status: She is alert.     Comments: Mildly tremulous, slow-moving, decreased attention span.      ED Treatments / Results  Labs (all labs ordered are listed, but only abnormal results are displayed) Labs Reviewed  CBC WITH DIFFERENTIAL/PLATELET - Abnormal; Notable for the following components:      Result Value   RBC 3.62 (*)    Hemoglobin 8.9 (*)    HCT 29.0 (*)    MCH 24.6 (*)    RDW 22.1 (*)    Platelets 110 (*)    All other components within normal limits  COMPREHENSIVE METABOLIC PANEL - Abnormal; Notable for the following components:   Sodium 130 (*)    Potassium 2.7 (*)    Chloride 90 (*)    Glucose, Bld 103 (*)    BUN <5 (*)    Calcium 7.4 (*)    Total Protein 6.1 (*)    Albumin 2.3 (*)    AST 60 (*)    Alkaline Phosphatase 135 (*)    Total Bilirubin 2.6 (*)    All other components within normal limits  URINALYSIS, ROUTINE W REFLEX MICROSCOPIC - Abnormal; Notable for the following components:   Hgb urine dipstick MODERATE (*)    Ketones, ur 5 (*)    Bacteria, UA RARE (*)    All other components within normal limits  ETHANOL - Abnormal; Notable for the following components:   Alcohol, Ethyl (B) 192 (*)    All other components within normal limits  RAPID URINE DRUG SCREEN, HOSP PERFORMED - Abnormal; Notable for the following components:   Benzodiazepines POSITIVE (*)    All other components within normal limits  LIPASE, BLOOD     EKG EKG Interpretation  Date/Time:  Wednesday July 19 2018 15:17:35 EDT Ventricular Rate:  83 PR Interval:    QRS Duration: 108 QT Interval:  410 QTC Calculation: 482 R Axis:   85 Text Interpretation:  Sinus rhythm Low voltage, precordial leads Borderline T abnormalities, anterior leads since last tracing no significant change Confirmed by Mancel BaleWentz, Elliott [id number]) on 07/19/2018 3:36:17 PM   Radiology Ct Abdomen Pelvis W Contrast  Result Date: 07/19/2018 CLINICAL DATA:  Hepatic cirrhosis.  Abdominal distension. EXAM: CT ABDOMEN AND PELVIS WITH CONTRAST TECHNIQUE: Multidetector CT imaging of the abdomen and pelvis was performed using the standard protocol following bolus administration of intravenous contrast. CONTRAST:  100mL OMNIPAQUE IOHEXOL 300 MG/ML  SOLN COMPARISON:  April 27, 2018 FINDINGS: Lower chest: On axial slice 1 series 5, there is a nodular opacity in the inferior lingula measuring 5 mm. There is mild associated scarring in this area. The lung bases elsewhere are clear. Hepatobiliary: The liver is markedly diminished in size and contour, with significant reduction in liver volume compared to less than 3 months prior. There is diffuse dysplastic change throughout the remaining liver. No enhancing liver lesions are evident on this study. Remaining liver has a nodular contour. These are changes indicative of advanced cirrhosis. Gallbladder appears mildly distended. There is no gallbladder wall thickening. No biliary duct dilatation is evident. Pancreas: There is no pancreatic mass or inflammatory focus. Spleen: No splenic lesions are evident.  Spleen is not overtly enlarged. Adrenals/Urinary Tract: Adrenals bilaterally appear normal. Kidneys bilaterally show no evident mass or hydronephrosis on either side. No renal or ureteral calculus is evident. Urinary bladder is midline with wall thickness within normal limits. Stomach/Bowel: There is no appreciable bowel wall or mesenteric  thickening. There is no evident bowel obstruction. There is no free air or portal venous air. Vascular/Lymphatic: There is aortic and iliac artery atherosclerosis. No aneurysm evident. There are esophageal varices as well as perigastric varices. There is umbilical vein recanalization with opacification in the region of the umbilicus, likely with caput medusae type pattern. There is no adenopathy in the abdomen or pelvis. Reproductive: Uterus is anteverted.  No pelvic masses evident. Other: There is extensive ascites throughout the abdomen and pelvis. There is diffuse anasarca involving the abdominal and pelvic walls. There is no periappendiceal region inflammatory change. No abscess is evident in the abdomen or pelvis. Musculoskeletal: No blastic or lytic bone lesions. There is extensive muscle atrophy bilaterally. Breast implants noted bilaterally. IMPRESSION: 1. Advanced hepatic cirrhosis. The liver is much smaller in irregular in contour compared to less than 3 months prior. Extensive liver dysplastic change evident. No enhancing liver mass evident. 2. Extensive varices in the distal esophagus and perigastric regions. Recanalization of the umbilical vein with vascular prominence in the umbilical region, likely with caput medusae type pattern. 3.  Extensive ascites.  Extensive anasarca. 4.  No bowel obstruction.  No abscess in the abdomen or pelvis. 5. No evident renal or ureteral calculus. No hydronephrosis. Urinary bladder wall thickness normal. 6.  There is aortoiliac atherosclerosis. 7. 5 mm nodular opacity in the inferior lingula. No follow-up needed if patient is low-risk. Non-contrast chest CT can be considered in 12 months if patient is high-risk. This recommendation follows the consensus statement: Guidelines for Management of Incidental Pulmonary Nodules Detected on CT Images: From the Fleischner Society 2017; Radiology 2017; 284:228-243. Electronically Signed   By: Bretta Bang III M.D.   On:  07/19/2018 15:16    Procedures Procedures (including critical care time)  Medications Ordered in ED Medications  gabapentin (NEURONTIN) capsule 100 mg (100 mg Oral Given 07/19/18 1348)  potassium chloride SA (K-DUR) CR tablet 40 mEq (40 mEq Oral Given 07/19/18 1549)  potassium chloride 10 mEq in 100 mL IVPB (0 mEq Intravenous Stopped 07/19/18 1809)  iohexol (OMNIPAQUE) 300 MG/ML solution 100 mL (100 mLs Intravenous Contrast Given 07/19/18 1503)     Initial Impression / Assessment and Plan / ED Course  I have reviewed the triage vital signs and the nursing notes.  Pertinent labs & imaging results that were available during my care of the patient were reviewed by me and considered in my medical decision making (see chart for details).        BP (!) 109/51    Pulse 78    Temp 97.9 F (36.6 C) (Oral)    Resp 15    Ht  (1.575 m)    Wt 86.2 kg    SpO2 99%    BMI 34.76 kg/m    Final Clinical Impressions(s) / ED Diagnoses   Final diagnoses:  Alcohol abuse  Ascites due to alcoholic cirrhosis (HCC)  Medication refill    ED Discharge Orders         Ordered    gabapentin (NEURONTIN) 100 MG capsule  3 times daily     07/19/18 1846         12:40 PM Patient's primary reason for ER visit  is a request of refill of her Neurontin for her peripheral neuropathy.  She also has a significant history of alcohol abuse last drink was yesterday.  She also reports and withdrawal however she is not exhibiting symptoms of withdrawal at this time.  She does have multiple ecchymosis throughout her bilateral arms as well as her large ecchymosis to her right side of her mid back.  She did not recall any specific injury.  She is a poor historian.  Will perform screening labs.  Patient has a tense abdomen consistent with abdominal ascites.  She would benefit from a therapeutic paracentesis outpatient.  3:34 PM Pt's Hgb 8.9, near baseline.  UDS with positive benzo, similar to prior.  UA showing moderate  Hgb on urine dipstic, no RBC therefore I suspect myoglobinuria.  No evidence of UTI.  Potassium is 2.7, supplementation given.  Alcohol is 192, pt was allow to rest and sober up.  CT of abd/pelvis demonstrates extensive ascites along with extensive anasarca.  Extensive varices in the distal esophagus as well.  Advanced hepatic cirrhosis.    6:40 PM Patient received supplementation for her low potassium.  I encourage patient to eat a banana daily for the next week and have your potassium level rechecked.  Will prescribe a short course of Neurontin.  Encourage patient to follow-up with PCP for further care.   Domenic Moras, PA-C 07/19/18 1849    Daleen Bo, MD 07/21/18 2209

## 2018-07-19 NOTE — ED Notes (Signed)
Pt stating she wants to go home. PA notified

## 2018-07-19 NOTE — Progress Notes (Signed)
Consult request has been received. CSW attempting to follow up at present time.  CSW sees pt is being picked up by pt's daughter at this time.  Marylou Flesher, LCSW, [place] Social Worker [phone]

## 2018-07-19 NOTE — ED Notes (Signed)
Dinner Tray Ordered @ 1642-per RN called by Perry Molla  

## 2018-07-19 NOTE — ED Notes (Signed)
Pt given ice chips and water 

## 2018-07-20 ENCOUNTER — Encounter (HOSPITAL_COMMUNITY): Payer: Self-pay

## 2018-07-20 ENCOUNTER — Emergency Department (HOSPITAL_COMMUNITY)
Admission: EM | Admit: 2018-07-20 | Discharge: 2018-07-20 | Discharge status: Against Medical Advice | Payer: Self-pay | Attending: Emergency Medicine | Admitting: Emergency Medicine

## 2018-07-20 DIAGNOSIS — Z79899 Other long term (current) drug therapy: Secondary | ICD-10-CM | POA: Insufficient documentation

## 2018-07-20 DIAGNOSIS — R5381 Other malaise: Secondary | ICD-10-CM | POA: Insufficient documentation

## 2018-07-20 DIAGNOSIS — G35 Multiple sclerosis: Secondary | ICD-10-CM | POA: Insufficient documentation

## 2018-07-20 DIAGNOSIS — Z9981 Dependence on supplemental oxygen: Secondary | ICD-10-CM | POA: Insufficient documentation

## 2018-07-20 DIAGNOSIS — J9611 Chronic respiratory failure with hypoxia: Secondary | ICD-10-CM | POA: Insufficient documentation

## 2018-07-20 DIAGNOSIS — I4892 Unspecified atrial flutter: Secondary | ICD-10-CM | POA: Insufficient documentation

## 2018-07-20 DIAGNOSIS — J449 Chronic obstructive pulmonary disease, unspecified: Secondary | ICD-10-CM | POA: Insufficient documentation

## 2018-07-20 DIAGNOSIS — F17210 Nicotine dependence, cigarettes, uncomplicated: Secondary | ICD-10-CM | POA: Insufficient documentation

## 2018-07-20 MED ORDER — ACETAMINOPHEN 325 MG PO TABS: 650.0000 mg | ORAL_TABLET | Freq: Once | ORAL | Status: DC

## 2018-07-20 NOTE — ED Provider Notes (Signed)
[facility] DEPT Provider Note   CSN: [id number] Arrival date & time: 07/20/18  1619    History   Chief Complaint Chief Complaint  Patient presents with  . Vaginal Pain  . Generalized Body Aches    HPI Anne Black is a 58 y.o. female.     HPI She states that she is here for evaluation of "pain all over."  He came by EMS.  She attributes her pain to her multiple sclerosis.  She has frequent visits to the emergency department.  She has chronic alcoholism.  She denies other problems or concerns at this time.  She wishes to go home, as of 5:15 PM.  There are no other known modifying factors.   Past Medical History:  Diagnosis Date  . ADHD (attention deficit hyperactivity disorder)   . Alcoholic cirrhosis ([place])    with ascites  . Alcoholism ([place])   . Allergies   . Anxiety   . Arthritis   . Atrial fibrillation with RVR ([place])   . Bipolar disorder ([place])   . COPD (chronic obstructive pulmonary disease) (HCC)    wears 2L chronic O2  . Dyspnea   . EtOH dependence ([place]) 05/14/2018  . GERD (gastroesophageal reflux disease)   . History of hiatal hernia   . Hypokalemia   . Migraine   . Multiple sclerosis ([place])   . Narcolepsy   . Osteoarthritis of knee   . Osteoporosis   . Pneumonia     Patient Active Problem List   Diagnosis Date Noted  . Cellulitis of lower extremity   . Peripheral edema   . Atrial fibrillation with RVR ([place]) 06/13/2018  . Alcohol dependence ([place]) 05/06/2018  . Electrolyte disturbance 05/05/2018  . Alcohol withdrawal delirium ([place]) 04/28/2018  . Weakness generalized 04/27/2018  . Gait abnormality 04/24/2018  . Chronic hypoxemic respiratory failure ([place]) 04/11/2018  . Atrial fibrillation with rapid ventricular response ([place]) 03/23/2018  . Head injury   . Fall 03/19/2018  . Syncope 03/19/2018  . Orbital fracture 03/19/2018  . Scalp laceration 03/19/2018  . Acute respiratory failure with hypoxia and hypercarbia ([place])  03/19/2018  . Abdominal tenderness 03/19/2018  . Acute on chronic respiratory failure with hypoxia ([place]) 03/17/2018  . Alcohol abuse with intoxication ([place]) 03/17/2018  . ETOH abuse   . Normocytic anemia 03/16/2018  . Acute metabolic encephalopathy 23/53/6144  . Lower extremity edema 03/16/2018  . Migraine 03/16/2018  . Community acquired pneumonia of left lower lobe of lung ([place])   . Weakness 08/21/2017  . COPD exacerbation ([place]) 06/12/2017  . Chest pain with moderate risk for cardiac etiology 05/30/2017  . Abnormal LFTs 05/30/2017  . Tachycardia 05/30/2017  . COPD (chronic obstructive pulmonary disease) ([place]) 04/19/2017  . Right rib fracture 04/15/2017  . Abdominal wall cellulitis 12/30/2016  . S/P exploratory laparotomy 12/30/2016  . Distal radius fracture, left 09/09/2016  . Thrombocytopenia ([place]) 09/09/2016  . Chest x-ray abnormality   . Umbilical hernia without obstruction and without gangrene 05/12/2016  . Itching 02/24/2016  . Ventral hernia without obstruction or gangrene 02/24/2016  . Palliative care encounter   . Ascites   . Tachypnea   . Atrial flutter with rapid ventricular response ([place])   . Hypomagnesemia   . Hypoxia   . Dehydration 01/22/2016  . Low back ache 12/30/2015  . Non-intractable vomiting with nausea   . Alcohol abuse with alcohol-induced mood disorder ([place]) 12/15/2015  . Acute alcoholism ([place])   . Distended abdomen 11/04/2015  .  Alcohol abuse   . Dyspnea   . Acute respiratory failure (HCC) 10/15/2015  . Ascites due to alcoholic cirrhosis (HCC) 10/15/2015  . Osteoporosis   . Multiple sclerosis (HCC)   . Alcoholic cirrhosis of liver with ascites (HCC) 08/22/2015  . Bipolar disorder, current episode mixed, moderate (HCC) 08/01/2015  . Alcohol use disorder, severe, dependence (HCC) 07/31/2015  . Macrocytic anemia- due to alcohol abuse with normal B12 & folate levels 05/31/2015  . Severe protein-calorie malnutrition (HCC) 05/31/2015  . Hypokalemia  05/26/2015  . Encephalopathy, hepatic (HCC) 05/26/2015  . Alcohol withdrawal (HCC) 05/18/2015  . Abdominal pain 05/18/2015  . Anxiety 05/18/2015  . Stimulant abuse (HCC) 12/27/2014  . Nicotine abuse 12/27/2014  . Hyperprolactinemia (HCC) 11/15/2014  . Dyslipidemia   . Alcohol use disorder, moderate, dependence (HCC) 10/30/2014  . Tobacco abuse 01/23/2014  . Bipolar 1 disorder (HCC) 04/07/2013  . Noncompliance with therapeutic plan 04/04/2013  . Hereditary and idiopathic peripheral neuropathy 05/01/2012  . Depression 05/01/2012  . GERD (gastroesophageal reflux disease) 05/01/2012  . Osteoarthrosis, unspecified whether generalized or localized, involving lower leg 05/01/2012  . Hyponatremia 07/01/2011    Past Surgical History:  Procedure Laterality Date  . ABDOMINAL WALL DEFECT REPAIR N/A 12/30/2016   Procedure: EXPLORATORY LAPAROTOMY WITH REPAIR ABDOMINAL WALL VENTRAL HERNIA;  Surgeon: Emelia LoronWakefield, Matthew, MD;  [facility];  Service: General;  Laterality: N/A;  . APPLICATION OF WOUND VAC N/A 12/30/2016   Procedure: APPLICATION OF WOUND VAC;  Surgeon: Emelia LoronWakefield, Matthew, MD;  [facility];  Service: General;  Laterality: N/A;  . CESAREAN SECTION  [id number]  . FRACTURE SURGERY    . HERNIA REPAIR    . IR PARACENTESIS  06/14/2018  . MYRINGOTOMY WITH TUBE PLACEMENT Bilateral   . ORIF WRIST FRACTURE Left 09/09/2016   Procedure: OPEN REDUCTION INTERNAL FIXATION (ORIF) LEFT WRIST FRACTURE, LEFT CARPAL TUNNEL RELEASE;  Surgeon: Dominica SeverinGramig, William, MD;  Location: [facility] ORS;  Service: Orthopedics;  Laterality: Left;  . TONSILLECTOMY       OB History   No obstetric history on file.      Home Medications    Prior to Admission medications   Medication Sig Start Date End Date Taking? Authorizing Provider  acetaminophen (TYLENOL) 500 MG tablet Take 1 tablet (500 mg total) by mouth every 6 (six) hours as needed for mild pain. Patient taking differently: Take 1,000 mg by mouth every 6 (six)  hours as needed for mild pain.  03/28/18   Rolly SalterPatel, Pranav M, MD  diltiazem (CARDIZEM CD) 120 MG 24 hr capsule Take 1 capsule (120 mg total) by mouth daily for 30 days. 04/14/18 07/01/18  Tegeler, Canary Brimhristopher J, MD  furosemide (LASIX) 40 MG tablet Take 1 tablet (40 mg total) by mouth daily. 06/27/18   Sabas SousBero, Michael M, MD  gabapentin (NEURONTIN) 100 MG capsule Take 2 capsules (200 mg total) by mouth 3 (three) times daily for 30 days. 07/19/18 08/18/18  Fayrene Helperran, Bowie, PA-C  hydrOXYzine (ATARAX/VISTARIL) 25 MG tablet Take 1 tablet (25 mg total) by mouth daily as needed for up to 30 doses (anxiety). Patient taking differently: Take 25 mg by mouth daily as needed for anxiety (withdrawal).  04/14/18   Tegeler, Canary Brimhristopher J, MD  magnesium oxide (MAG-OX) 400 MG tablet Take 400 mg by mouth daily.    [provider]  methocarbamol (ROBAXIN) 500 MG tablet Take 1 tablet (500 mg total) by mouth 2 (two) times daily as needed for muscle spasms. 06/27/18   Sabas SousBero, Michael M, MD  ondansetron (ZOFRAN) 4 MG tablet Take 4 mg by mouth every 8 (eight) hours as needed for nausea or vomiting.    [provider]  pantoprazole (PROTONIX) 40 MG tablet Take 1 tablet (40 mg total) by mouth daily for 30 days. 07/19/18 08/18/18  Fayrene Helperran, Bowie, PA-C  potassium chloride SA (K-DUR,KLOR-CON) 20 MEQ tablet Take 2 tablets (40 mEq total) by mouth 2 (two) times daily. 04/19/18   Bethel BornGekas, Kelly Marie, PA-C  spironolactone (ALDACTONE) 25 MG tablet Take 1 tablet (25 mg total) by mouth daily for 30 days. 06/27/18 07/27/18  Sabas SousBero, Michael M, MD    Family History Family History  Problem Relation Age of Onset  . Arrhythmia Mother   . Neuropathy Mother   . Coronary artery disease Mother   . Heart disease Mother   . Heart disease Father   . Hypertension Father   . Heart attack Father   . Multiple sclerosis Maternal Aunt     Social History Social History   Tobacco Use  . Smoking status: Current Every Day Smoker    Packs/day: 1.00     Years: 36.00    Pack years: 36.00    Types: Cigarettes  . Smokeless tobacco: Never Used  Substance Use Topics  . Alcohol use: Yes    Comment: Chronic ETOH abuse  . Drug use: No     Allergies   Patient has no known allergies.   Review of Systems Review of Systems  All other systems reviewed and are negative.    Physical Exam Updated Vital Signs BP (!) 105/50 (BP Location: Right Arm)   Pulse 92   Temp 97.9 F (36.6 C) (Oral)   Resp 20   SpO2 94%   Physical Exam Vitals signs and nursing note reviewed.  Constitutional:      General: She is not in acute distress.    Appearance: She is well-developed. She is not ill-appearing, toxic-appearing or diaphoretic.     Comments: Disheveled  HENT:     Head: Normocephalic and atraumatic.  Eyes:     Conjunctiva/sclera: Conjunctivae normal.     Pupils: Pupils are equal, round, and reactive to light.  Neck:     Musculoskeletal: Normal range of motion and neck supple.     Trachea: Phonation normal.  Cardiovascular:     Rate and Rhythm: Normal rate.  Pulmonary:     Effort: Pulmonary effort is normal.  Musculoskeletal: Normal range of motion.  Skin:    General: Skin is warm and dry.     Comments: Superficial abrasion right dorsal forearm with mild bleeding.  Neurological:     Mental Status: She is alert and oriented to person, place, and time.     Motor: No abnormal muscle tone.     Comments: No dysarthria or aphasia.  No ataxia.  Psychiatric:        Mood and Affect: Mood normal.        Behavior: Behavior normal.      ED Treatments / Results  Labs (all labs ordered are listed, but only abnormal results are displayed) Labs Reviewed - No data to display  EKG None  Radiology Ct Abdomen Pelvis W Contrast  Result Date: 07/19/2018 CLINICAL DATA:  Hepatic cirrhosis.  Abdominal distension. EXAM: CT ABDOMEN AND PELVIS WITH CONTRAST TECHNIQUE: Multidetector CT imaging of the abdomen and pelvis was performed using the  standard protocol following bolus administration of intravenous contrast. CONTRAST:  100mL OMNIPAQUE IOHEXOL 300 MG/ML  SOLN COMPARISON:  April 27, 2018 FINDINGS:  Lower chest: On axial slice 1 series 5, there is a nodular opacity in the inferior lingula measuring 5 mm. There is mild associated scarring in this area. The lung bases elsewhere are clear. Hepatobiliary: The liver is markedly diminished in size and contour, with significant reduction in liver volume compared to less than 3 months prior. There is diffuse dysplastic change throughout the remaining liver. No enhancing liver lesions are evident on this study. Remaining liver has a nodular contour. These are changes indicative of advanced cirrhosis. Gallbladder appears mildly distended. There is no gallbladder wall thickening. No biliary duct dilatation is evident. Pancreas: There is no pancreatic mass or inflammatory focus. Spleen: No splenic lesions are evident. Spleen is not overtly enlarged. Adrenals/Urinary Tract: Adrenals bilaterally appear normal. Kidneys bilaterally show no evident mass or hydronephrosis on either side. No renal or ureteral calculus is evident. Urinary bladder is midline with wall thickness within normal limits. Stomach/Bowel: There is no appreciable bowel wall or mesenteric thickening. There is no evident bowel obstruction. There is no free air or portal venous air. Vascular/Lymphatic: There is aortic and iliac artery atherosclerosis. No aneurysm evident. There are esophageal varices as well as perigastric varices. There is umbilical vein recanalization with opacification in the region of the umbilicus, likely with caput medusae type pattern. There is no adenopathy in the abdomen or pelvis. Reproductive: Uterus is anteverted.  No pelvic masses evident. Other: There is extensive ascites throughout the abdomen and pelvis. There is diffuse anasarca involving the abdominal and pelvic walls. There is no periappendiceal region inflammatory  change. No abscess is evident in the abdomen or pelvis. Musculoskeletal: No blastic or lytic bone lesions. There is extensive muscle atrophy bilaterally. Breast implants noted bilaterally. IMPRESSION: 1. Advanced hepatic cirrhosis. The liver is much smaller in irregular in contour compared to less than 3 months prior. Extensive liver dysplastic change evident. No enhancing liver mass evident. 2. Extensive varices in the distal esophagus and perigastric regions. Recanalization of the umbilical vein with vascular prominence in the umbilical region, likely with caput medusae type pattern. 3.  Extensive ascites.  Extensive anasarca. 4.  No bowel obstruction.  No abscess in the abdomen or pelvis. 5. No evident renal or ureteral calculus. No hydronephrosis. Urinary bladder wall thickness normal. 6.  There is aortoiliac atherosclerosis. 7. 5 mm nodular opacity in the inferior lingula. No follow-up needed if patient is low-risk. Non-contrast chest CT can be considered in 12 months if patient is high-risk. This recommendation follows the consensus statement: Guidelines for Management of Incidental Pulmonary Nodules Detected on CT Images: From the Fleischner Society 2017; Radiology 2017; 284:228-243. Electronically Signed   By: Bretta BangWilliam  Woodruff III M.D.   On: 07/19/2018 15:16    Procedures Procedures (including critical care time)  Medications Ordered in ED Medications  acetaminophen (TYLENOL) tablet 650 mg (has no administration in time range)     Initial Impression / Assessment and Plan / ED Course  I have reviewed the triage vital signs and the nursing notes.  Pertinent labs & imaging results that were available during my care of the patient were reviewed by me and considered in my medical decision making (see chart for details).         Patient Vitals for the past 24 hrs:  BP Temp Temp src Pulse Resp SpO2  07/20/18 1638 (!) 105/50 97.9 F (36.6 C) Oral 92 20 94 %    5:19 PM Reevaluation with  update and discussion. After initial assessment and treatment, an updated evaluation  reveals no change in clinical status. Mancel Bale   Medical Decision Making: Alcoholism with frequent ED visits.  Patient appears to be at her baseline, she is well-known to me.  CRITICAL CARE-no Performed by: Mancel Bale  Nursing Notes Reviewed/ Care Coordinated Applicable Imaging Reviewed Interpretation of Laboratory Data incorporated into ED treatment  The patient appears reasonably screened and/or stabilized for discharge and I doubt any other medical condition or other [place] requiring further screening, evaluation, or treatment in the ED at this time prior to discharge.  Plan: Home Medications-continue usual; Home Treatments-rest, fluids; return here if the recommended treatment, does not improve the symptoms; Recommended follow up-PCP, PRN   Final Clinical Impressions(s) / ED Diagnoses   Final diagnoses:  [facility]    ED Discharge Orders    None       Mancel Bale, MD 07/20/18 1721

## 2018-07-20 NOTE — ED Triage Notes (Signed)
C/o vaginal chafing and "all over pain" r/t MS.    A/Ox4   Refused Vital Signs

## 2018-07-20 NOTE — Discharge Instructions (Signed)
See your doctor for problems.

## 2018-07-20 NOTE — ED Notes (Addendum)
Patient walked out and stated her daughter is here to get her. She does not want to stay. Patient refused to put on pants.   Left AMA.

## 2018-07-23 ENCOUNTER — Emergency Department (HOSPITAL_COMMUNITY)
Admission: EM | Admit: 2018-07-23 | Discharge: 2018-07-23 | Disposition: A | Discharge status: Home / Self Care | Payer: Self-pay | Attending: Emergency Medicine | Admitting: Emergency Medicine

## 2018-07-23 ENCOUNTER — Encounter (HOSPITAL_COMMUNITY): Payer: Self-pay

## 2018-07-23 ENCOUNTER — Other Ambulatory Visit: Payer: Self-pay

## 2018-07-23 ENCOUNTER — Emergency Department (HOSPITAL_COMMUNITY): Payer: Self-pay

## 2018-07-23 DIAGNOSIS — F17210 Nicotine dependence, cigarettes, uncomplicated: Secondary | ICD-10-CM | POA: Insufficient documentation

## 2018-07-23 DIAGNOSIS — Z79899 Other long term (current) drug therapy: Secondary | ICD-10-CM | POA: Insufficient documentation

## 2018-07-23 DIAGNOSIS — R52 Pain, unspecified: Secondary | ICD-10-CM | POA: Insufficient documentation

## 2018-07-23 DIAGNOSIS — J9611 Chronic respiratory failure with hypoxia: Secondary | ICD-10-CM | POA: Insufficient documentation

## 2018-07-23 DIAGNOSIS — I4892 Unspecified atrial flutter: Secondary | ICD-10-CM | POA: Insufficient documentation

## 2018-07-23 DIAGNOSIS — Z9119 Patient's noncompliance with other medical treatment and regimen: Secondary | ICD-10-CM | POA: Insufficient documentation

## 2018-07-23 DIAGNOSIS — Z91199 Patient's noncompliance with other medical treatment and regimen due to unspecified reason: Secondary | ICD-10-CM

## 2018-07-23 DIAGNOSIS — I4891 Unspecified atrial fibrillation: Secondary | ICD-10-CM | POA: Insufficient documentation

## 2018-07-23 DIAGNOSIS — J449 Chronic obstructive pulmonary disease, unspecified: Secondary | ICD-10-CM | POA: Insufficient documentation

## 2018-07-23 DIAGNOSIS — G35 Multiple sclerosis: Secondary | ICD-10-CM | POA: Insufficient documentation

## 2018-07-23 NOTE — ED Notes (Signed)
Pt Refusing Blood Draw

## 2018-07-23 NOTE — ED Notes (Signed)
Pt refused EKG.

## 2018-07-23 NOTE — ED Notes (Signed)
Bed: VH46 Expected date:  Expected time:  Means of arrival:  Comments: EMS 59y Female, ETOH? Combative?

## 2018-07-23 NOTE — ED Notes (Addendum)
Patient refused vital signs upon discharge and refused signature for discharge. Discharge instructions reviewed with patient and opportunity for questions allowed. Patient alert, stable, and ambulatory at discharge.

## 2018-07-23 NOTE — ED Notes (Signed)
Patient refused IV/blood draw and EKG multiple times. Patient keeps yelling out, stating "I just want water and a warm blanket." EDP made aware.

## 2018-07-23 NOTE — ED Triage Notes (Signed)
Patient arrived via [facility] from home. Patient is AOx4 and ambulatory however needs some assistance lifting legs off bed. Patient called 911 due to pain all over, primarily in buttocks. Patient is covered in feces and blood. Patient has been intentionally picking at scabs which is why patient has blood on body. Patient has no other complaints. Patient is currently being verbally rude to EMS and to [facility] staff.

## 2018-07-23 NOTE — ED Provider Notes (Signed)
[facility] DEPT Provider Note   CSN: [id number] Arrival date & time: 07/23/18  1822    History   Chief Complaint Chief Complaint  Patient presents with   Pain all over    HPI Anne Black is a 58 y.o. female.     HPI Patient presents with the complaint of "pain all over".  Patient is a very poor historian.  She is unable to give any other characteristics including timing, quality or location of the pain.  She is been seen multiple times in the emergency department with similar complaints.  Patient has already refused EKG. Past Medical History:  Diagnosis Date   ADHD (attention deficit hyperactivity disorder)    Alcoholic cirrhosis ([place])    with ascites   Alcoholism ([place])    Allergies    Anxiety    Arthritis    Atrial fibrillation with RVR (HCC)    Bipolar disorder (HCC)    COPD (chronic obstructive pulmonary disease) (HCC)    wears 2L chronic O2   Dyspnea    EtOH dependence ([place]) 05/14/2018   GERD (gastroesophageal reflux disease)    History of hiatal hernia    Hypokalemia    Migraine    Multiple sclerosis ([place])    Narcolepsy    Osteoarthritis of knee    Osteoporosis    Pneumonia     Patient Active Problem List   Diagnosis Date Noted   Cellulitis of lower extremity    Peripheral edema    Atrial fibrillation with RVR ([place]) 06/13/2018   Alcohol dependence ([place]) 05/06/2018   Electrolyte disturbance 05/05/2018   Alcohol withdrawal delirium ([place]) 04/28/2018   Weakness generalized 04/27/2018   Gait abnormality 04/24/2018   Chronic hypoxemic respiratory failure ([place]) 04/11/2018   Atrial fibrillation with rapid ventricular response ([place]) 03/23/2018   Head injury    Fall 03/19/2018   Syncope 03/19/2018   Orbital fracture 03/19/2018   Scalp laceration 03/19/2018   Acute respiratory failure with hypoxia and hypercarbia (HCC) 03/19/2018   Abdominal tenderness 03/19/2018   Acute on chronic  respiratory failure with hypoxia ([place]) 03/17/2018   Alcohol abuse with intoxication ([place]) 03/17/2018   ETOH abuse    Normocytic anemia 11/24/5100   Acute metabolic encephalopathy 58/52/7782   Lower extremity edema 03/16/2018   Migraine 03/16/2018   Community acquired pneumonia of left lower lobe of lung ([place])    Weakness 08/21/2017   COPD exacerbation ([place]) 06/12/2017   Chest pain with moderate risk for cardiac etiology 05/30/2017   Abnormal LFTs 05/30/2017   Tachycardia 05/30/2017   COPD (chronic obstructive pulmonary disease) ([place]) 04/19/2017   Right rib fracture 04/15/2017   Abdominal wall cellulitis 12/30/2016   S/P exploratory laparotomy 12/30/2016   Distal radius fracture, left 09/09/2016   Thrombocytopenia ([place]) 09/09/2016   Chest x-ray abnormality    Umbilical hernia without obstruction and without gangrene 05/12/2016   Itching 02/24/2016   Ventral hernia without obstruction or gangrene 02/24/2016   Palliative care encounter    Ascites    Tachypnea    Atrial flutter with rapid ventricular response ([place])    Hypomagnesemia    Hypoxia    Dehydration 01/22/2016   Low back ache 12/30/2015   Non-intractable vomiting with nausea    Alcohol abuse with alcohol-induced mood disorder ([place]) 12/15/2015   Acute alcoholism ([place])    Distended abdomen 11/04/2015   Alcohol abuse    Dyspnea    Acute respiratory failure ([place]) 10/15/2015   Ascites due to alcoholic cirrhosis (  HCC) 10/15/2015   Osteoporosis    Multiple sclerosis (HCC)    Alcoholic cirrhosis of liver with ascites (HCC) 08/22/2015   Bipolar disorder, current episode mixed, moderate (HCC) 08/01/2015   Alcohol use disorder, severe, dependence (HCC) 07/31/2015   Macrocytic anemia- due to alcohol abuse with normal B12 & folate levels 05/31/2015   Severe protein-calorie malnutrition (HCC) 05/31/2015   Hypokalemia 05/26/2015   Encephalopathy, hepatic (HCC) 05/26/2015   Alcohol  withdrawal (HCC) 05/18/2015   Abdominal pain 05/18/2015   Anxiety 05/18/2015   Stimulant abuse (HCC) 12/27/2014   Nicotine abuse 12/27/2014   Hyperprolactinemia (HCC) 11/15/2014   Dyslipidemia    Alcohol use disorder, moderate, dependence (HCC) 10/30/2014   Tobacco abuse 01/23/2014   Bipolar 1 disorder (HCC) 04/07/2013   Noncompliance with therapeutic plan 04/04/2013   Hereditary and idiopathic peripheral neuropathy 05/01/2012   Depression 05/01/2012   GERD (gastroesophageal reflux disease) 05/01/2012   Osteoarthrosis, unspecified whether generalized or localized, involving lower leg 05/01/2012   Hyponatremia 07/01/2011    Past Surgical History:  Procedure Laterality Date   ABDOMINAL WALL DEFECT REPAIR N/A 12/30/2016   Procedure: EXPLORATORY LAPAROTOMY WITH REPAIR ABDOMINAL WALL VENTRAL HERNIA;  Surgeon: [name], MD;  Location: [facility] OR;  Service: General;  Laterality: N/A;   APPLICATION OF WOUND VAC N/A 12/30/2016   Procedure: APPLICATION OF WOUND VAC;  Surgeon: [name], MD;  Location: [facility] OR;  Service: General;  Laterality: N/A;   CESAREAN SECTION  [phone]   FRACTURE SURGERY     HERNIA REPAIR     IR PARACENTESIS  06/14/2018   MYRINGOTOMY WITH TUBE PLACEMENT Bilateral    ORIF WRIST FRACTURE Left 09/09/2016   Procedure: OPEN REDUCTION INTERNAL FIXATION (ORIF) LEFT WRIST FRACTURE, LEFT CARPAL TUNNEL RELEASE;  Surgeon: Dominica Severin, MD;  Location: [facility] ORS;  Service: Orthopedics;  Laterality: Left;   TONSILLECTOMY       OB History   No obstetric history on file.      Home Medications    Prior to Admission medications   Medication Sig Start Date End Date Taking? Authorizing Provider  acetaminophen (TYLENOL) 500 MG tablet Take 1 tablet (500 mg total) by mouth every 6 (six) hours as needed for mild pain. Patient taking differently: Take 1,000 mg by mouth every 6 (six) hours as needed for mild pain.  03/28/18   Rolly Salter, MD    diltiazem (CARDIZEM CD) 120 MG 24 hr capsule Take 1 capsule (120 mg total) by mouth daily for 30 days. 04/14/18 07/01/18  Tegeler, Canary Brim, MD  furosemide (LASIX) 40 MG tablet Take 1 tablet (40 mg total) by mouth daily. 06/27/18   Sabas Sous, MD  gabapentin (NEURONTIN) 100 MG capsule Take 2 capsules (200 mg total) by mouth 3 (three) times daily for 30 days. 07/19/18 08/18/18  Fayrene Helper, PA-C  hydrOXYzine (ATARAX/VISTARIL) 25 MG tablet Take 1 tablet (25 mg total) by mouth daily as needed for up to 30 doses (anxiety). Patient taking differently: Take 25 mg by mouth daily as needed for anxiety (withdrawal).  04/14/18   Tegeler, Canary Brim, MD  magnesium oxide (MAG-OX) 400 MG tablet Take 400 mg by mouth daily.    [provider]  methocarbamol (ROBAXIN) 500 MG tablet Take 1 tablet (500 mg total) by mouth 2 (two) times daily as needed for muscle spasms. 06/27/18   Sabas Sous, MD  ondansetron (ZOFRAN) 4 MG tablet Take 4 mg by mouth every 8 (eight) hours as needed for nausea or vomiting.  [provider]  pantoprazole (PROTONIX) 40 MG tablet Take 1 tablet (40 mg total) by mouth daily for 30 days. 07/19/18 08/18/18  Fayrene Helperran, [place], PA-C  potassium chloride SA (K-DUR,KLOR-CON) 20 MEQ tablet Take 2 tablets (40 mEq total) by mouth 2 (two) times daily. 04/19/18   Bethel BornGekas, Kelly Marie, PA-C  spironolactone (ALDACTONE) 25 MG tablet Take 1 tablet (25 mg total) by mouth daily for 30 days. 06/27/18 07/27/18  Sabas SousBero, Michael M, MD    Family History Family History  Problem Relation Age of Onset   Arrhythmia Mother    Neuropathy Mother    Coronary artery disease Mother    Heart disease Mother    Heart disease Father    Hypertension Father    Heart attack Father    Multiple sclerosis Maternal Aunt     Social History Social History   Tobacco Use   Smoking status: Current Every Day Smoker    Packs/day: 1.00    Years: 36.00    Pack years: 36.00    Types: Cigarettes    Smokeless tobacco: Never Used  Substance Use Topics   Alcohol use: Yes    Comment: Chronic ETOH abuse   Drug use: No     Allergies   Patient has no known allergies.   Review of Systems Review of Systems  Unable to perform ROS: Other     Physical Exam Updated Vital Signs BP (!) 104/59 (BP Location: Left Arm)    Pulse 88    Temp 97.7 F (36.5 C) (Oral)    Resp 18    Ht 5\' 2"  (1.575 m)    Wt 82.6 kg    SpO2 100%    BMI 33.31 kg/m   Physical Exam Vitals signs and nursing note reviewed.  Constitutional:      Appearance: She is well-developed.     Comments: Disheveled appearance.  No acute distress.  HENT:     Head: Normocephalic and atraumatic.     Comments: No obvious head injury.    Mouth/Throat:     Mouth: Mucous membranes are dry.  Eyes:     Extraocular Movements: Extraocular movements intact.     Pupils: Pupils are equal, round, and reactive to light.  Neck:     Musculoskeletal: Normal range of motion and neck supple. No neck rigidity or muscular tenderness.  Cardiovascular:     Rate and Rhythm: Normal rate and regular rhythm.  Pulmonary:     Effort: Pulmonary effort is normal. No respiratory distress.     Breath sounds: Normal breath sounds. No stridor. No wheezing, rhonchi or rales.  Chest:     Chest wall: No tenderness.  Abdominal:     General: Bowel sounds are normal. There is distension.     Palpations: Abdomen is soft.     Tenderness: There is no abdominal tenderness. There is no guarding or rebound.     Comments: Abdomen is distended but does not appear to be tender to palpation.  Musculoskeletal: Normal range of motion.        General: No tenderness.     Right lower leg: Edema present.     Left lower leg: Edema present.     Comments: 1+ bilateral lower extremity pitting edema.  Distal pulses intact.  Lymphadenopathy:     Cervical: No cervical adenopathy.  Skin:    General: Skin is warm and dry.     Capillary Refill: Capillary refill takes less  than 2 seconds.     Findings: No  erythema or rash.  Neurological:     Mental Status: She is alert and oriented to person, place, and time.     Comments: Moving all extremities without focal deficit.  Sensation intact.      ED Treatments / Results  Labs (all labs ordered are listed, but only abnormal results are displayed) Labs Reviewed  COMPREHENSIVE METABOLIC PANEL  CBC WITH DIFFERENTIAL/PLATELET  URINALYSIS, ROUTINE W REFLEX MICROSCOPIC  ETHANOL  MAGNESIUM  CBG MONITORING, ED    EKG None  Radiology Dg Chest Port 1 View  Result Date: 07/23/2018 CLINICAL DATA:  Hypoxia EXAM: PORTABLE CHEST 1 VIEW COMPARISON:  06/30/2018 FINDINGS: Heart and mediastinal contours are within normal limits. No focal opacities or effusions. No acute bony abnormality. IMPRESSION: No active disease. Electronically Signed   By: Charlett NoseKevin  Dover M.D.   On: 07/23/2018 19:31    Procedures Procedures (including critical care time)  Medications Ordered in ED Medications - No data to display   Initial Impression / Assessment and Plan / ED Course  I have reviewed the triage vital signs and the nursing notes.  Pertinent labs & imaging results that were available during my care of the patient were reviewed by me and considered in my medical decision making (see chart for details).        Informed patient we would not be giving narcotics.  Will assess for diffuse causes of pain including electrolyte abnormality.  Patient is refusing all blood work.  She appears competent to make decisions regarding her care.  Chest x-ray without acute findings.  Vital signs are stable.  Patient is on chronic supplemental oxygen.  Final Clinical Impressions(s) / ED Diagnoses   Final diagnoses:  Diffuse pain  Medically noncompliant    ED Discharge Orders    None       Loren RacerYelverton, Mariaelena Cade, MD 07/23/18 2000

## 2018-07-24 ENCOUNTER — Encounter (HOSPITAL_COMMUNITY): Payer: Self-pay | Admitting: Emergency Medicine

## 2018-07-24 ENCOUNTER — Emergency Department (HOSPITAL_COMMUNITY)
Admission: EM | Admit: 2018-07-24 | Discharge: 2018-07-24 | Disposition: A | Discharge status: Home / Self Care | Payer: Self-pay | Attending: Emergency Medicine | Admitting: Emergency Medicine

## 2018-07-24 ENCOUNTER — Encounter (HOSPITAL_COMMUNITY): Payer: Self-pay

## 2018-07-24 ENCOUNTER — Other Ambulatory Visit: Payer: Self-pay

## 2018-07-24 ENCOUNTER — Encounter (HOSPITAL_COMMUNITY): Payer: Self-pay | Admitting: Family Medicine

## 2018-07-24 DIAGNOSIS — R112 Nausea with vomiting, unspecified: Secondary | ICD-10-CM | POA: Insufficient documentation

## 2018-07-24 DIAGNOSIS — R21 Rash and other nonspecific skin eruption: Secondary | ICD-10-CM | POA: Insufficient documentation

## 2018-07-24 DIAGNOSIS — J449 Chronic obstructive pulmonary disease, unspecified: Secondary | ICD-10-CM | POA: Insufficient documentation

## 2018-07-24 DIAGNOSIS — R0602 Shortness of breath: Secondary | ICD-10-CM | POA: Insufficient documentation

## 2018-07-24 DIAGNOSIS — F1010 Alcohol abuse, uncomplicated: Secondary | ICD-10-CM | POA: Insufficient documentation

## 2018-07-24 DIAGNOSIS — R110 Nausea: Secondary | ICD-10-CM | POA: Insufficient documentation

## 2018-07-24 DIAGNOSIS — Z5321 Procedure and treatment not carried out due to patient leaving prior to being seen by health care provider: Secondary | ICD-10-CM | POA: Insufficient documentation

## 2018-07-24 DIAGNOSIS — Z79899 Other long term (current) drug therapy: Secondary | ICD-10-CM | POA: Insufficient documentation

## 2018-07-24 DIAGNOSIS — G8929 Other chronic pain: Secondary | ICD-10-CM | POA: Insufficient documentation

## 2018-07-24 DIAGNOSIS — F17210 Nicotine dependence, cigarettes, uncomplicated: Secondary | ICD-10-CM | POA: Insufficient documentation

## 2018-07-24 DIAGNOSIS — R109 Unspecified abdominal pain: Secondary | ICD-10-CM | POA: Insufficient documentation

## 2018-07-24 DIAGNOSIS — R3989 Other symptoms and signs involving the genitourinary system: Secondary | ICD-10-CM | POA: Insufficient documentation

## 2018-07-24 DIAGNOSIS — R52 Pain, unspecified: Secondary | ICD-10-CM | POA: Insufficient documentation

## 2018-07-24 LAB — COMPREHENSIVE METABOLIC PANEL
ALT: 28 U/L (ref 0–44)
ALT: 28 U/L (ref 0–44)
AST: 82 U/L — ABNORMAL HIGH (ref 15–41)
AST: 78 U/L — ABNORMAL HIGH (ref 15–41)
Albumin: 2.6 g/dL — ABNORMAL LOW (ref 3.5–5.0)
Albumin: 2.2 g/dL — ABNORMAL LOW (ref 3.5–5.0)
Alkaline Phosphatase: 174 U/L — ABNORMAL HIGH (ref 38–126)
Alkaline Phosphatase: 173 U/L — ABNORMAL HIGH (ref 38–126)
Anion gap: 11 (ref 5–15)
Anion gap: 11 (ref 5–15)
BUN: 5 mg/dL — ABNORMAL LOW (ref 6–20)
BUN: 5 mg/dL — ABNORMAL LOW (ref 6–20)
CO2: 28 mmol/L (ref 22–32)
CO2: 26 mmol/L (ref 22–32)
Calcium: 7.6 mg/dL — ABNORMAL LOW (ref 8.9–10.3)
Calcium: 7.5 mg/dL — ABNORMAL LOW (ref 8.9–10.3)
Chloride: 90 mmol/L — ABNORMAL LOW (ref 98–111)
Chloride: 90 mmol/L — ABNORMAL LOW (ref 98–111)
Creatinine, Ser: 0.34 mg/dL — ABNORMAL LOW (ref 0.44–1.00)
Creatinine, Ser: 0.46 mg/dL (ref 0.44–1.00)
GFR calc Af Amer: >60 mL/min (ref >60)
GFR calc Af Amer: >60 mL/min (ref >60)
GFR calc non Af Amer: >60 mL/min (ref >60)
GFR calc non Af Amer: >60 mL/min (ref >60)
Glucose, Bld: 89 mg/dL (ref 70–99)
Glucose, Bld: 124 mg/dL — ABNORMAL HIGH (ref 70–99)
Potassium: 4.4 mmol/L (ref 3.5–5.1)
Potassium: 3.9 mmol/L (ref 3.5–5.1)
Sodium: 129 mmol/L — ABNORMAL LOW (ref 135–145)
Sodium: 127 mmol/L — ABNORMAL LOW (ref 135–145)
Total Bilirubin: 2.8 mg/dL — ABNORMAL HIGH (ref 0.3–1.2)
Total Bilirubin: 2.7 mg/dL — ABNORMAL HIGH (ref 0.3–1.2)
Total Protein: 6.4 g/dL — ABNORMAL LOW (ref 6.5–8.1)
Total Protein: 5.9 g/dL — ABNORMAL LOW (ref 6.5–8.1)

## 2018-07-24 LAB — CBC WITH DIFFERENTIAL/PLATELET
Abs Immature Granulocytes: 0.01 10*3/uL (ref 0.00–0.07)
Basophils Absolute: 0 10*3/uL (ref 0.0–0.1)
Basophils Relative: 1 %
Eosinophils Absolute: 0 10*3/uL (ref 0.0–0.5)
Eosinophils Relative: 0 %
HCT: 29.1 % — ABNORMAL LOW (ref 36.0–46.0)
Hemoglobin: 8.8 g/dL — ABNORMAL LOW (ref 12.0–15.0)
Immature Granulocytes: 0 %
Lymphocytes Relative: 13 %
Lymphs Abs: 0.4 10*3/uL — ABNORMAL LOW (ref 0.7–4.0)
MCH: 24.7 pg — ABNORMAL LOW (ref 26.0–34.0)
MCHC: 30.2 g/dL (ref 30.0–36.0)
MCV: 81.7 fL (ref 80.0–100.0)
Monocytes Absolute: 0.7 10*3/uL (ref 0.1–1.0)
Monocytes Relative: 22 %
Neutro Abs: 1.9 10*3/uL (ref 1.7–7.7)
Neutrophils Relative %: 64 %
Platelets: 102 10*3/uL — ABNORMAL LOW (ref 150–400)
RBC: 3.56 MIL/uL — ABNORMAL LOW (ref 3.87–5.11)
RDW: 21.5 % — ABNORMAL HIGH (ref 11.5–15.5)
WBC: 3 10*3/uL — ABNORMAL LOW (ref 4.0–10.5)
nRBC: 0 % (ref 0.0–0.2)

## 2018-07-24 LAB — CBC
HCT: 28 % — ABNORMAL LOW (ref 36.0–46.0)
Hemoglobin: 8.6 g/dL — ABNORMAL LOW (ref 12.0–15.0)
MCH: 24.9 pg — ABNORMAL LOW (ref 26.0–34.0)
MCHC: 30.7 g/dL (ref 30.0–36.0)
MCV: 81.2 fL (ref 80.0–100.0)
Platelets: 93 10*3/uL — ABNORMAL LOW (ref 150–400)
RBC: 3.45 MIL/uL — ABNORMAL LOW (ref 3.87–5.11)
RDW: 21.6 % — ABNORMAL HIGH (ref 11.5–15.5)
WBC: 2.6 10*3/uL — ABNORMAL LOW (ref 4.0–10.5)
nRBC: 0 % (ref 0.0–0.2)

## 2018-07-24 LAB — DIFFERENTIAL
Abs Immature Granulocytes: 0.02 10*3/uL (ref 0.00–0.07)
Basophils Absolute: 0 10*3/uL (ref 0.0–0.1)
Basophils Relative: 2 %
Eosinophils Absolute: 0 10*3/uL (ref 0.0–0.5)
Eosinophils Relative: 0 %
Immature Granulocytes: 1 %
Lymphocytes Relative: 22 %
Lymphs Abs: 0.6 10*3/uL — ABNORMAL LOW (ref 0.7–4.0)
Monocytes Absolute: 0.6 10*3/uL (ref 0.1–1.0)
Monocytes Relative: 23 %
Neutro Abs: 1.4 10*3/uL — ABNORMAL LOW (ref 1.7–7.7)
Neutrophils Relative %: 52 %

## 2018-07-24 LAB — POC OCCULT BLOOD, ED
Fecal Occult Bld: NEGATIVE
Fecal Occult Bld: POSITIVE — AB

## 2018-07-24 LAB — ETHANOL: Alcohol, Ethyl (B): 235 mg/dL — ABNORMAL HIGH (ref <10)

## 2018-07-24 LAB — LIPASE, BLOOD: Lipase: 29 U/L (ref 11–51)

## 2018-07-24 LAB — TROPONIN I: Troponin I: <0.03 ng/mL (ref <0.03)

## 2018-07-24 MED ORDER — SODIUM CHLORIDE 0.9 % IV BOLUS
500.0000 mL | Freq: Once | INTRAVENOUS | Status: AC
  Administered 2018-07-24: 500 mL via INTRAVENOUS

## 2018-07-24 MED ORDER — FAMOTIDINE IN NACL 20-0.9 MG/50ML-% IV SOLN
20.0000 mg | Freq: Once | INTRAVENOUS | Status: AC
  Administered 2018-07-24: 20 mg via INTRAVENOUS
  Filled 2018-07-24: qty 50

## 2018-07-24 MED ORDER — ONDANSETRON 4 MG PO TBDP
4.0000 mg | ORAL_TABLET | Freq: Once | ORAL | Status: AC
  Administered 2018-07-24: 4 mg via ORAL
  Filled 2018-07-24: qty 1

## 2018-07-24 MED ORDER — FAMOTIDINE 20 MG PO TABS: 20.0000 mg | ORAL_TABLET | Freq: Every day | ORAL | 0 refills | Status: DC

## 2018-07-24 MED ORDER — ALUM & MAG HYDROXIDE-SIMETH 200-200-20 MG/5ML PO SUSP
15.0000 mL | Freq: Once | ORAL | Status: AC
  Administered 2018-07-24: 15 mL via ORAL
  Filled 2018-07-24: qty 30

## 2018-07-24 MED ORDER — ONDANSETRON HCL 4 MG/2ML IJ SOLN
4.0000 mg | Freq: Once | INTRAMUSCULAR | Status: AC
  Administered 2018-07-24: 12:00:00 4 mg via INTRAVENOUS
  Filled 2018-07-24: qty 2

## 2018-07-24 NOTE — ED Provider Notes (Signed)
[facility] EMERGENCY DEPARTMENT Provider Note   CSN: [id number] Arrival date & time: 07/24/18  1105     History   Chief Complaint Chief Complaint  Patient presents with   Multiple Complaints    HPI Anne Black is a 58 y.o. female presenting for evaluation of nausea, vomiting, abdominal pain.  Patient states she is having worsening abdominal pain.  She also reports that she is thrown up again.  Patient states her symptoms keep on getting worse.  She also reports pain all over.  She was seen in the ED several days ago for the same, was given a prescription for her Neurontin but has not gotten it filled.  Patient states that since she left [facility] several hours prior to arrival, she did drink alcohol.  She will not tell me how much.  She denies blood in her emesis.  Denies fever, chills, or chest pain.  Patient states she is shortness of breath, this is chronic.  She is on oxygen at baseline at home, but not wearing any currently.  She denies urinary symptoms.  Patient states she has been having a lot of diarrhea, sometimes pooping on herself.  She denies new back pain.  She denies fall, trauma, or injury.  She denies numbness in her legs.  She is ambulatory.  Additional history obtained from chart review.  Has been seen 48 times in the past 6 months.  This is her seventh visit in the past week, last visit was earlier today.  In the past week, she has had a negative CT and reassuring lab work.     HPI  Past Medical History:  Diagnosis Date   ADHD (attention deficit hyperactivity disorder)    Alcoholic cirrhosis (HCC)    with ascites   Alcoholism (HCC)    Allergies    Anxiety    Arthritis    Atrial fibrillation with RVR (HCC)    Bipolar disorder (HCC)    COPD (chronic obstructive pulmonary disease) (HCC)    wears 2L chronic O2   Dyspnea    EtOH dependence (HCC) 05/14/2018   GERD (gastroesophageal reflux disease)    History of hiatal hernia     Hypokalemia    Migraine    Multiple sclerosis (HCC)    Narcolepsy    Osteoarthritis of knee    Osteoporosis    Pneumonia     Patient Active Problem List   Diagnosis Date Noted   Cellulitis of lower extremity    Peripheral edema    Atrial fibrillation with RVR (HCC) 06/13/2018   Alcohol dependence (HCC) 05/06/2018   Electrolyte disturbance 05/05/2018   Alcohol withdrawal delirium (HCC) 04/28/2018   Weakness generalized 04/27/2018   Gait abnormality 04/24/2018   Chronic hypoxemic respiratory failure (HCC) 04/11/2018   Atrial fibrillation with rapid ventricular response (HCC) 03/23/2018   Head injury    Fall 03/19/2018   Syncope 03/19/2018   Orbital fracture 03/19/2018   Scalp laceration 03/19/2018   Acute respiratory failure with hypoxia and hypercarbia (HCC) 03/19/2018   Abdominal tenderness 03/19/2018   Acute on chronic respiratory failure with hypoxia (HCC) 03/17/2018   Alcohol abuse with intoxication (HCC) 03/17/2018   ETOH abuse    Normocytic anemia 03/16/2018   Acute metabolic encephalopathy 03/16/2018   Lower extremity edema 03/16/2018   Migraine 03/16/2018   Community acquired pneumonia of left lower lobe of lung (HCC)    Weakness 08/21/2017   COPD exacerbation (HCC) 06/12/2017   Chest pain with moderate  risk for cardiac etiology 05/30/2017   Abnormal LFTs 05/30/2017   Tachycardia 05/30/2017   COPD (chronic obstructive pulmonary disease) (HCC) 04/19/2017   Right rib fracture 04/15/2017   Abdominal wall cellulitis 12/30/2016   S/P exploratory laparotomy 12/30/2016   Distal radius fracture, left 09/09/2016   Thrombocytopenia (HCC) 09/09/2016   Chest x-ray abnormality    Umbilical hernia without obstruction and without gangrene 05/12/2016   Itching 02/24/2016   Ventral hernia without obstruction or gangrene 02/24/2016   Palliative care encounter    Ascites    Tachypnea    Atrial flutter with rapid  ventricular response (HCC)    Hypomagnesemia    Hypoxia    Dehydration 01/22/2016   Low back ache 12/30/2015   Non-intractable vomiting with nausea    Alcohol abuse with alcohol-induced mood disorder (HCC) 12/15/2015   Acute alcoholism (HCC)    Distended abdomen 11/04/2015   Alcohol abuse    Dyspnea    Acute respiratory failure (HCC) 10/15/2015   Ascites due to alcoholic cirrhosis (HCC) 10/15/2015   Osteoporosis    Multiple sclerosis (HCC)    Alcoholic cirrhosis of liver with ascites (HCC) 08/22/2015   Bipolar disorder, current episode mixed, moderate (HCC) 08/01/2015   Alcohol use disorder, severe, dependence (HCC) 07/31/2015   Macrocytic anemia- due to alcohol abuse with normal B12 & folate levels 05/31/2015   Severe protein-calorie malnutrition (HCC) 05/31/2015   Hypokalemia 05/26/2015   Encephalopathy, hepatic (HCC) 05/26/2015   Alcohol withdrawal (HCC) 05/18/2015   Abdominal pain 05/18/2015   Anxiety 05/18/2015   Stimulant abuse (HCC) 12/27/2014   Nicotine abuse 12/27/2014   Hyperprolactinemia (HCC) 11/15/2014   Dyslipidemia    Alcohol use disorder, moderate, dependence (HCC) 10/30/2014   Tobacco abuse 01/23/2014   Bipolar 1 disorder (HCC) 04/07/2013   Noncompliance with therapeutic plan 04/04/2013   Hereditary and idiopathic peripheral neuropathy 05/01/2012   Depression 05/01/2012   GERD (gastroesophageal reflux disease) 05/01/2012   Osteoarthrosis, unspecified whether generalized or localized, involving lower leg 05/01/2012   Hyponatremia 07/01/2011    Past Surgical History:  Procedure Laterality Date   ABDOMINAL WALL DEFECT REPAIR N/A 12/30/2016   Procedure: EXPLORATORY LAPAROTOMY WITH REPAIR ABDOMINAL WALL VENTRAL HERNIA;  Surgeon: Emelia LoronWakefield, Matthew, MD;  [facility];  Service: General;  Laterality: N/A;   APPLICATION OF WOUND VAC N/A 12/30/2016   Procedure: APPLICATION OF WOUND VAC;  Surgeon: Emelia LoronWakefield, Matthew, MD;   Location: [facility] OR;  Service: General;  Laterality: N/A;   CESAREAN SECTION  [phone]   FRACTURE SURGERY     HERNIA REPAIR     IR PARACENTESIS  06/14/2018   MYRINGOTOMY WITH TUBE PLACEMENT Bilateral    ORIF WRIST FRACTURE Left 09/09/2016   Procedure: OPEN REDUCTION INTERNAL FIXATION (ORIF) LEFT WRIST FRACTURE, LEFT CARPAL TUNNEL RELEASE;  Surgeon: Dominica SeverinGramig, William, MD;  Location: [facility] ORS;  Service: Orthopedics;  Laterality: Left;   TONSILLECTOMY       OB History   No obstetric history on file.      Home Medications    Prior to Admission medications   Medication Sig Start Date End Date Taking? Authorizing Provider  acetaminophen (TYLENOL) 500 MG tablet Take 1 tablet (500 mg total) by mouth every 6 (six) hours as needed for mild pain. Patient taking differently: Take 1,000 mg by mouth every 6 (six) hours as needed for mild pain.  03/28/18  Yes Rolly SalterPatel, Pranav M, MD  furosemide (LASIX) 40 MG tablet Take 1 tablet (40 mg total) by mouth daily. 06/27/18  Yes  Sabas SousBero, Michael M, MD  gabapentin (NEURONTIN) 100 MG capsule Take 2 capsules (200 mg total) by mouth 3 (three) times daily for 30 days. 07/19/18 08/18/18 Yes Fayrene Helperran, Bowie, PA-C  methocarbamol (ROBAXIN) 500 MG tablet Take 1 tablet (500 mg total) by mouth 2 (two) times daily as needed for muscle spasms. 06/27/18  Yes Sabas SousBero, Michael M, MD  ondansetron (ZOFRAN) 4 MG tablet Take 4 mg by mouth every 8 (eight) hours as needed for nausea or vomiting.   Yes [provider]  pantoprazole (PROTONIX) 40 MG tablet Take 1 tablet (40 mg total) by mouth daily for 30 days. 07/19/18 08/18/18 Yes Fayrene Helperran, Bowie, PA-C  potassium chloride SA (K-DUR,KLOR-CON) 20 MEQ tablet Take 2 tablets (40 mEq total) by mouth 2 (two) times daily. 04/19/18  Yes Bethel BornGekas, Kelly Marie, PA-C  spironolactone (ALDACTONE) 25 MG tablet Take 1 tablet (25 mg total) by mouth daily for 30 days. 06/27/18 07/27/18 Yes Bero, Elmer SowMichael M, MD  diltiazem (CARDIZEM CD) 120 MG 24 hr capsule Take 1 capsule (120  mg total) by mouth daily for 30 days. 04/14/18 07/01/18  Tegeler, Canary Brimhristopher J, MD  famotidine (PEPCID) 20 MG tablet Take 1 tablet (20 mg total) by mouth daily for 14 days. 07/24/18 08/07/18  Deetra Booton, PA-C  hydrOXYzine (ATARAX/VISTARIL) 25 MG tablet Take 1 tablet (25 mg total) by mouth daily as needed for up to 30 doses (anxiety). Patient not taking: Reported on 07/24/2018 04/14/18   Tegeler, Canary Brimhristopher J, MD  magnesium oxide (MAG-OX) 400 MG tablet Take 400 mg by mouth daily.    [provider]    Family History Family History  Problem Relation Age of Onset   Arrhythmia Mother    Neuropathy Mother    Coronary artery disease Mother    Heart disease Mother    Heart disease Father    Hypertension Father    Heart attack Father    Multiple sclerosis Maternal Aunt     Social History Social History   Tobacco Use   Smoking status: Current Every Day Smoker    Packs/day: 1.00    Years: 36.00    Pack years: 36.00    Types: Cigarettes   Smokeless tobacco: Never Used  Substance Use Topics   Alcohol use: Yes    Comment: Chronic ETOH abuse   Drug use: No     Allergies   Patient has no known allergies.   Review of Systems Review of Systems  Gastrointestinal: Positive for abdominal pain, diarrhea, nausea and vomiting.  All other systems reviewed and are negative.    Physical Exam Updated Vital Signs BP 114/62 (BP Location: Right Arm)    Pulse 99    Temp 98.1 F (36.7 C) (Oral)    Resp 13    SpO2 100%   Physical Exam Vitals signs and nursing note reviewed. Exam conducted with a chaperone present.  Constitutional:      General: She is not in acute distress.    Appearance: She is well-developed.     Comments: Appears chronically ill, but not in acute distress  HENT:     Head: Normocephalic and atraumatic.  Eyes:     Extraocular Movements: Extraocular movements intact.     Conjunctiva/sclera: Conjunctivae normal.     Pupils: Pupils are equal, round,  and reactive to light.  Neck:     Musculoskeletal: Normal range of motion and neck supple.  Cardiovascular:     Rate and Rhythm: Normal rate and regular rhythm.     Pulses:  Normal pulses.  Pulmonary:     Effort: Pulmonary effort is normal. No respiratory distress.     Breath sounds: Normal breath sounds. No wheezing.  Abdominal:     General: There is distension.     Palpations: Abdomen is soft. There is no mass.     Tenderness: There is no abdominal tenderness. There is no guarding.     Comments: Abdomen is distended, appears chronic per chart review.  No obvious tenderness palpation.  No rigidity or guarding.  Genitourinary:    Comments: Rectal exam shows skin breakdown around the rectum.  Hemoccult positive. Musculoskeletal: Normal range of motion.  Skin:    General: Skin is warm and dry.     Capillary Refill: Capillary refill takes less than 2 seconds.  Neurological:     Mental Status: She is alert and oriented to person, place, and time.      ED Treatments / Results  Labs (all labs ordered are listed, but only abnormal results are displayed) Labs Reviewed  CBC WITH DIFFERENTIAL/PLATELET - Abnormal; Notable for the following components:      Result Value   WBC 3.0 (*)    RBC 3.56 (*)    Hemoglobin 8.8 (*)    HCT 29.1 (*)    MCH 24.7 (*)    RDW 21.5 (*)    Platelets 102 (*)    Lymphs Abs 0.4 (*)    All other components within normal limits  COMPREHENSIVE METABOLIC PANEL - Abnormal; Notable for the following components:   Sodium 127 (*)    Chloride 90 (*)    Glucose, Bld 124 (*)    BUN 5 (*)    Calcium 7.5 (*)    Total Protein 5.9 (*)    Albumin 2.2 (*)    AST 78 (*)    Alkaline Phosphatase 173 (*)    Total Bilirubin 2.7 (*)    All other components within normal limits  ETHANOL - Abnormal; Notable for the following components:   Alcohol, Ethyl (B) 235 (*)    All other components within normal limits  POC OCCULT BLOOD, ED - Abnormal; Notable for the following  components:   Fecal Occult Bld POSITIVE (*)    All other components within normal limits  POC OCCULT BLOOD, ED    EKG EKG Interpretation  Date/Time:  Monday July 24 2018 11:44:14 EDT Ventricular Rate:  97 PR Interval:    QRS Duration: 98 QT Interval:  369 QTC Calculation: 469 R Axis:   79 Text Interpretation:  Sinus rhythm Low voltage, precordial leads No significant change since last tracing Confirmed by Deno Etienne [phone]) on 07/24/2018 11:51:01 AM   Radiology Dg Chest Port 1 View  Result Date: 07/23/2018 CLINICAL DATA:  Hypoxia EXAM: PORTABLE CHEST 1 VIEW COMPARISON:  06/30/2018 FINDINGS: Heart and mediastinal contours are within normal limits. No focal opacities or effusions. No acute bony abnormality. IMPRESSION: No active disease. Electronically Signed   By: Rolm Baptise M.D.   On: 07/23/2018 19:31    Procedures Procedures (including critical care time)  Medications Ordered in ED Medications  ondansetron (ZOFRAN) injection 4 mg (4 mg Intravenous Given 07/24/18 1217)  sodium chloride 0.9 % bolus 500 mL (0 mLs Intravenous Stopped 07/24/18 1506)  alum & mag hydroxide-simeth (MAALOX/MYLANTA) 200-200-20 MG/5ML suspension 15 mL (15 mLs Oral Given 07/24/18 1219)  famotidine (PEPCID) IVPB 20 mg premix (0 mg Intravenous Stopped 07/24/18 1506)     Initial Impression / Assessment and Plan / ED Course  I  have reviewed the triage vital signs and the nursing notes.  Pertinent labs & imaging results that were available during my care of the patient were reviewed by me and considered in my medical decision making (see chart for details).        Patient presenting for evaluation nausea, vomiting, diarrhea, abdominal pain.  Physical exam shows patient appears chronically ill, but not acutely ill today.  Has been seen 4 times in the past week for the same.  Patient is not taking her home Neurontin for her chronic peripheral pain.  She had reassuring labs this morning.  However, as patient  states her symptoms are worsening, will repeat labs, give medication for symptom control, and reassess.  Labs reassuring, they are at her baseline.  She has chronically low sodium and hemoglobin, both of which are stable.  Her LFTs are elevated, but at baseline.  Ethanol elevated.  Hemoccult was positive today, patient does have skin breakdown around her rectum.  However as her hemoglobin is stable, and she is not on blood thinner, I do not believe she needs to be admitted at this time.  On reassessment, patient reports her symptoms improved with Zofran, Pepcid, and fluids.  She is requesting to speak to social work.  I discussed with social work, who [facility] earlier [facility] at [facility].  Social work states that they gave patient and daughter resources for outpatient alcohol programs and for further investigation as to whether she wants home health.  Social work states there is nothing else they can provide at this time.  I discussed with patient that she needs to follow-up with outpatient programs.  Discussed that she needs to stop drinking alcohol, or this will continue to progress. I doubt sbp at this time, and I do not believe repeat imaging is warranted.  Case discussed with attending, Dr. Leavy CellaBoyd agrees to plan.  At this time, patient appears safe for discharge.  Return precautions given.  Patient states she understands.   Final Clinical Impressions(s) / ED Diagnoses   Final diagnoses:  Chronic abdominal pain  Nausea and vomiting, intractability of vomiting not specified, unspecified vomiting type  Alcohol abuse    ED Discharge Orders         Ordered    famotidine (PEPCID) 20 MG tablet  Daily     07/24/18 1532           Madhuri Vacca, PA-C 07/24/18 1634    Melene PlanFloyd, Dan, DO 07/25/18 1308

## 2018-07-24 NOTE — Discharge Instructions (Signed)
Call your PCP today to schedule a follow up appointment.

## 2018-07-24 NOTE — ED Notes (Signed)
Spoke with pts daughter to let daughter know pt was given taxi voucher for ride home.

## 2018-07-24 NOTE — ED Notes (Signed)
Spoke with pts daughter, Keane Scrape, who understands discharge instructions. Pts daughter stated she will be able to pick up pt from waiting area around 7pm. Pt requesting cab voucher. Will speak to charge for cab voucher. Patient verbalizes understanding of discharge instructions. Opportunity for questioning and answers were provided. Armband removed by staff, pt discharged from ED.

## 2018-07-24 NOTE — Progress Notes (Signed)
CSW spoke with PA Sophia about this patient. Patient was at [facility] this morning and discharged. This CSW spoke with and provided patient's daughter with SA, home health, and ALF resources (see previous notes for details). PA aware that all the resources have been given from social work.  [place]en Circle, LCSW Transitions of Care Department [facility] ED [phone]

## 2018-07-24 NOTE — ED Notes (Signed)
PT refuse to ambulate to restroom state she is not being discharge.

## 2018-07-24 NOTE — ED Notes (Signed)
ED Provider at bedside. 

## 2018-07-24 NOTE — ED Notes (Signed)
Also, patient was given Ginger Ale. Patient appears in no acute distress.

## 2018-07-24 NOTE — ED Notes (Signed)
Pt ambulated to the restroom and back, pt stating she is short of breath. Once back in the bed and hooked back up, pt sating at 100% RA. This nurse instructed to take deep breaths. Pt in no distress.

## 2018-07-24 NOTE — ED Triage Notes (Signed)
Pt arrived via [facility] with complaints of generalized pain. Pt recently discharged from [facility]. Pt yelling upon arrival to get her warm blankets.

## 2018-07-24 NOTE — ED Provider Notes (Signed)
Emergency Department Provider Note   I have reviewed the triage vital signs and the nursing notes.   HISTORY  Chief Complaint Generalized Body Aches   HPI Anne Black is a 58 y.o. female with PMH of COPD, GERD, EtOH abuse, A-fib, and MS returns to the emergency department for evaluation of nausea.  Patient was seen yesterday evening with total body pain.  She is frequently in the emergency department with similar complaints.  During her last ED visit she refused blood work and EKG. patient returns with EMS.  She states that she is developed nausea.  She denies chest pain but does report some shortness of breath.  She states she always has shortness of breath but slightly worse.  Denies fevers.  Patient unable to qualify her pain.    Past Medical History:  Diagnosis Date  . ADHD (attention deficit hyperactivity disorder)   . Alcoholic cirrhosis ([place])    with ascites  . Alcoholism ([place])   . Allergies   . Anxiety   . Arthritis   . Atrial fibrillation with RVR ([place])   . Bipolar disorder ([place])   . COPD (chronic obstructive pulmonary disease) (HCC)    wears 2L chronic O2  . Dyspnea   . EtOH dependence ([place]) 05/14/2018  . GERD (gastroesophageal reflux disease)   . History of hiatal hernia   . Hypokalemia   . Migraine   . Multiple sclerosis ([place])   . Narcolepsy   . Osteoarthritis of knee   . Osteoporosis   . Pneumonia     Patient Active Problem List   Diagnosis Date Noted  . Cellulitis of lower extremity   . Peripheral edema   . Atrial fibrillation with RVR ([place]) 06/13/2018  . Alcohol dependence ([place]) 05/06/2018  . Electrolyte disturbance 05/05/2018  . Alcohol withdrawal delirium ([place]) 04/28/2018  . Weakness generalized 04/27/2018  . Gait abnormality 04/24/2018  . Chronic hypoxemic respiratory failure ([place]) 04/11/2018  . Atrial fibrillation with rapid ventricular response ([place]) 03/23/2018  . Head injury   . Fall 03/19/2018  . Syncope 03/19/2018  . Orbital  fracture 03/19/2018  . Scalp laceration 03/19/2018  . Acute respiratory failure with hypoxia and hypercarbia ([place]) 03/19/2018  . Abdominal tenderness 03/19/2018  . Acute on chronic respiratory failure with hypoxia ([place]) 03/17/2018  . Alcohol abuse with intoxication ([place]) 03/17/2018  . ETOH abuse   . Normocytic anemia 03/16/2018  . Acute metabolic encephalopathy 58/10/9831  . Lower extremity edema 03/16/2018  . Migraine 03/16/2018  . Community acquired pneumonia of left lower lobe of lung ([place])   . Weakness 08/21/2017  . COPD exacerbation ([place]) 06/12/2017  . Chest pain with moderate risk for cardiac etiology 05/30/2017  . Abnormal LFTs 05/30/2017  . Tachycardia 05/30/2017  . COPD (chronic obstructive pulmonary disease) ([place]) 04/19/2017  . Right rib fracture 04/15/2017  . Abdominal wall cellulitis 12/30/2016  . S/P exploratory laparotomy 12/30/2016  . Distal radius fracture, left 09/09/2016  . Thrombocytopenia ([place]) 09/09/2016  . Chest x-ray abnormality   . Umbilical hernia without obstruction and without gangrene 05/12/2016  . Itching 02/24/2016  . Ventral hernia without obstruction or gangrene 02/24/2016  . Palliative care encounter   . Ascites   . Tachypnea   . Atrial flutter with rapid ventricular response ([place])   . Hypomagnesemia   . Hypoxia   . Dehydration 01/22/2016  . Low back ache 12/30/2015  . Non-intractable vomiting with nausea   . Alcohol abuse with alcohol-induced mood disorder ([place]) 12/15/2015  .  Acute alcoholism (HCC)   . Distended abdomen 11/04/2015  . Alcohol abuse   . Dyspnea   . Acute respiratory failure (HCC) 10/15/2015  . Ascites due to alcoholic cirrhosis (HCC) 10/15/2015  . Osteoporosis   . Multiple sclerosis (HCC)   . Alcoholic cirrhosis of liver with ascites (HCC) 08/22/2015  . Bipolar disorder, current episode mixed, moderate (HCC) 08/01/2015  . Alcohol use disorder, severe, dependence (HCC) 07/31/2015  . Macrocytic anemia- due to alcohol abuse  with normal B12 & folate levels 05/31/2015  . Severe protein-calorie malnutrition (HCC) 05/31/2015  . Hypokalemia 05/26/2015  . Encephalopathy, hepatic (HCC) 05/26/2015  . Alcohol withdrawal (HCC) 05/18/2015  . Abdominal pain 05/18/2015  . Anxiety 05/18/2015  . Stimulant abuse (HCC) 12/27/2014  . Nicotine abuse 12/27/2014  . Hyperprolactinemia (HCC) 11/15/2014  . Dyslipidemia   . Alcohol use disorder, moderate, dependence (HCC) 10/30/2014  . Tobacco abuse 01/23/2014  . Bipolar 1 disorder (HCC) 04/07/2013  . Noncompliance with therapeutic plan 04/04/2013  . Hereditary and idiopathic peripheral neuropathy 05/01/2012  . Depression 05/01/2012  . GERD (gastroesophageal reflux disease) 05/01/2012  . Osteoarthrosis, unspecified whether generalized or localized, involving lower leg 05/01/2012  . Hyponatremia 07/01/2011    Past Surgical History:  Procedure Laterality Date  . ABDOMINAL WALL DEFECT REPAIR N/A 12/30/2016   Procedure: EXPLORATORY LAPAROTOMY WITH REPAIR ABDOMINAL WALL VENTRAL HERNIA;  Surgeon: Emelia LoronWakefield, Matthew, MD;  Location: [facility] OR;  Service: General;  Laterality: N/A;  . APPLICATION OF WOUND VAC N/A 12/30/2016   Procedure: APPLICATION OF WOUND VAC;  Surgeon: Emelia LoronWakefield, Matthew, MD;  [facility];  Service: General;  Laterality: N/A;  . CESAREAN SECTION  [phone]  . FRACTURE SURGERY    . HERNIA REPAIR    . IR PARACENTESIS  06/14/2018  . MYRINGOTOMY WITH TUBE PLACEMENT Bilateral   . ORIF WRIST FRACTURE Left 09/09/2016   Procedure: OPEN REDUCTION INTERNAL FIXATION (ORIF) LEFT WRIST FRACTURE, LEFT CARPAL TUNNEL RELEASE;  Surgeon: Dominica SeverinGramig, William, MD;  Location: [facility] ORS;  Service: Orthopedics;  Laterality: Left;  . TONSILLECTOMY      Allergies Patient has no known allergies.  Family History  Problem Relation Age of Onset  . Arrhythmia Mother   . Neuropathy Mother   . Coronary artery disease Mother   . Heart disease Mother   . Heart disease Father   . Hypertension  Father   . Heart attack Father   . Multiple sclerosis Maternal Aunt     Social History Social History   Tobacco Use  . Smoking status: Current Every Day Smoker    Packs/day: 1.00    Years: 36.00    Pack years: 36.00    Types: Cigarettes  . Smokeless tobacco: Never Used  Substance Use Topics  . Alcohol use: Yes    Comment: Chronic ETOH abuse  . Drug use: No    Review of Systems  Constitutional: No fever/chills. Positive total body pain.  Eyes: No visual changes. ENT: No sore throat. Cardiovascular: Denies chest pain. Respiratory: Denies shortness of breath. Gastrointestinal: No abdominal pain. Positive nausea, no vomiting.  No diarrhea.  No constipation. Genitourinary: Negative for dysuria. Musculoskeletal: Negative for back pain. Skin: Negative for rash. Neurological: Negative for headaches, focal weakness or numbness.  10-point ROS otherwise negative.  ____________________________________________   PHYSICAL EXAM:  VITAL SIGNS: ED Triage Vitals [07/24/18 0343]  Enc Vitals Group     BP 120/76     Pulse Rate 88     Resp 18     Temp 97.9  F (36.6 C)     Temp Source Oral     SpO2 100 %   Constitutional: Alert and oriented. Well appearing and in no acute distress. Eyes: Conjunctivae are normal.  Head: Atraumatic. Nose: No congestion/rhinnorhea. Mouth/Throat: Mucous membranes are moist.  Neck: No stridor.  Cardiovascular: Normal rate, regular rhythm. Good peripheral circulation. Grossly normal heart sounds.   Respiratory: Normal respiratory effort.  No retractions. Lungs CTAB. Gastrointestinal: Soft and nontender. No distention.  Musculoskeletal: No lower extremity tenderness with chronic venous stasis changes and edema. No gross deformities of extremities. Neurologic:  Normal speech and language. No gross focal neurologic deficits are appreciated.  Skin:  Skin is warm, dry and intact. No rash noted. Psychiatric: Mood and affect are flat. Not responding to  internal stimuli.   ____________________________________________   LABS (all labs ordered are listed, but only abnormal results are displayed)  Labs Reviewed  COMPREHENSIVE METABOLIC PANEL - Abnormal; Notable for the following components:      Result Value   Sodium 129 (*)    Chloride 90 (*)    BUN 5 (*)    Creatinine, Ser 0.34 (*)    Calcium 7.6 (*)    Total Protein 6.4 (*)    Albumin 2.6 (*)    AST 82 (*)    Alkaline Phosphatase 174 (*)    Total Bilirubin 2.8 (*)    All other components within normal limits  LIPASE, BLOOD  TROPONIN I  CBC WITH DIFFERENTIAL/PLATELET  URINALYSIS, ROUTINE W REFLEX MICROSCOPIC  CBC  DIFFERENTIAL   ____________________________________________  EKG   EKG Interpretation  Date/Time:  Monday July 24 2018 04:34:33 EDT Ventricular Rate:  89 PR Interval:    QRS Duration: 100 QT Interval:  381 QTC Calculation: 464 R Axis:   76 Text Interpretation:  Sinus rhythm Low voltage, precordial leads No STEMI  Confirmed by Alona BeneLong, Joshua [phone]([id number]) on 07/24/2018 4:48:26 AM Also confirmed by Alona BeneLong, Joshua [phone]([id number]), editor Barbette Hairassel, Kerry [phone]([id number])  on 07/24/2018 6:51:15 AM       ____________________________________________  RADIOLOGY  Dg Chest Port 1 View  Result Date: 07/23/2018 CLINICAL DATA:  Hypoxia EXAM: PORTABLE CHEST 1 VIEW COMPARISON:  06/30/2018 FINDINGS: Heart and mediastinal contours are within normal limits. No focal opacities or effusions. No acute bony abnormality. IMPRESSION: No active disease. Electronically Signed   By: Charlett NoseKevin  Dover M.D.   On: 07/23/2018 19:31    ____________________________________________   PROCEDURES  Procedure(s) performed:   Procedures  None  ____________________________________________   INITIAL IMPRESSION / ASSESSMENT AND PLAN / ED COURSE  Pertinent labs & imaging results that were available during my care of the patient were reviewed by me and considered in my medical decision making (see chart for  details).   Patient returns to the emergency department with total body pain, nausea.  She is agreeable to blood work emergently would be reasonable given her symptoms.  Her last set of labs showed hypokalemia which she has had in the past.  Patient's affect is bizarre but she is awake, alert, oriented.  She is able to ambulate to the bathroom but then refuses to wipe herself.  She appears to be physically able to do this.  She is being rude to staff.  Plan for screening labs and reassess.  Patient has been approached by social work this month but refused to meet with them and left AGAINST MEDICAL ADVICE.  During her last admission in late May patient also left AGAINST MEDICAL ADVICE.  SNF was recommended at  that time but patient returned home.  To me, patient does not appear acutely encephalopathic peers to have capacity to make such decisions today. For now is agreeable with plan for Zofran, labs, and reassess. CXR from yesterday reviewed which was normal.   CBC and UA pending.  Placed consult for social work.  They seem familiar with the patient.  Question if she would benefit from SNF but question is whether or not patient would be willing to go to SNF and if she would qualify given her ongoing alcohol abuse. May need home health and outpatient EtOH treatment.  ____________________________________________  FINAL CLINICAL IMPRESSION(S) / ED DIAGNOSES  Final diagnoses:  Nausea  Total body pain    MEDICATIONS GIVEN DURING THIS VISIT:  Medications  ondansetron (ZOFRAN-ODT) disintegrating tablet 4 mg (4 mg Oral Given 07/24/18 0426)     Note:  This document was prepared using Dragon voice recognition software and may include unintentional dictation errors.  Alona BeneJoshua Long, MD Emergency Medicine    Long, Arlyss RepressJoshua G, MD 07/24/18 [phone]

## 2018-07-24 NOTE — ED Triage Notes (Signed)
Patient is from home and transported via [facility]. Per EMS, patient is experiencing generalized pain, bladder spasms, nausea, depression, and buttocks sore with rash. Patient states she thinks she has an UTI. Also, patient was asking EMS what she could say to be admitted. She desires to be admitted to the hospital.

## 2018-07-24 NOTE — ED Notes (Signed)
Bed: LS93 Expected date:  Expected time:  Means of arrival:  Comments: 58 yo F/ pain in buttocks from sitting

## 2018-07-24 NOTE — Progress Notes (Addendum)
CSW spoke with patient at bedside. Patient complained of pain. Patient reports she needs help and does not feel she can care for herself at home and she lives alone. She asked for SNF placement. CSW explained to patient that there is no medical need for SNF at this time (she has been ambulating to a from the bathroom). Patient also stated she needed help with her alcohol use. When asked about her alcohol use, patient hesitantly stated that she drinking 4-5 "cups" of wine, 2-3x weekly. CSW provided patient with substance abuse resources. Patient again asked to go to a facility and CSW explained again that there is no need. Patient stated that she wanted to go home and stated her daughter could not come get her. She requested a voucher. CSW will attempt to make contact with patient's daughter or other family members to pick up patient once she is discharged.  Golden Circle, LCSW Transitions of Care Department [facility] ED [phone]

## 2018-07-24 NOTE — ED Notes (Addendum)
Upon arrival patient is requesting to use the restroom, this nurse and nurse Lovena Le attempted to assist patient to restroom. Pt states "I don't want to get up why cant you just cath me?" This nurse explained that we cannot cath her for comfort that there is risk of infection but we will assist her to the restroom. Pt then ambulated to the restroom with no difficulty. Pt refused to wipe after defecation and demanded this nurse wipe for her. This nurse asked how she normally takes care of herself at home, pt states "I dont I just sit in it." This nurse assisted pt. Pt then refused to wash her hands, this nurse explained the importance and encouraged her to wash them, after refusing again she agreed to use hand sanitizer. Pt assisted back into the bed. Pt now requesting a new nurse stating "The sight of you makes me sick, I could vomit just looking at you." This nurse explained that we just need to use proper hygiene especially with Covid-19 pandemic, pt stated she no longer wants to talk to this nurse and demanded I leave the room.

## 2018-07-24 NOTE — ED Notes (Addendum)
Pt ambulated to the doorway and yelled for help. Di Kindle, RN asked what she needed help with. Pt stated, "I'm leaving." Pt was provided scrub pants and assisted to a wheelchair. Pt asked for a cab to be called to take her home. A cab was called and pt was moved to the lobby.

## 2018-07-24 NOTE — Discharge Instructions (Addendum)
Your daughter was given information regarding alcohol abuse and detox programs.  It is important that you follow-up with 1 of these to help with your alcohol cessation. Pick up your prescriptions from the pharmacy, this will help your chronic pain. Use Pepcid daily to help with your stomach symptoms. Make sure you are staying well-hydrated water. Stop drinking alcohol. Return to the emergency room with any new, worsening, concerning symptoms.

## 2018-07-24 NOTE — ED Triage Notes (Signed)
Pt arrives to ED from a taxi service where pt defecated, urinated, and had one episode of emesis in the back of the car after being discharged from [facility] ER today. Pt states she has generalized pain but worsening abdominal pain.

## 2018-07-24 NOTE — TOC Initial Note (Signed)
Transition of Care [facility](TOC) - Initial/Assessment Note    Patient Details  Name: Anne ApplebaumCarla S Klarich MRN: [id number] Date of Birth: 02-Aug-1960  Transition of Care [facility](TOC) CM/SW Contact:    Montine CircleNovia C Clent Damore, LCSW Phone Number: 07/24/2018, 9:04 AM  Clinical Narrative:                 CSW spoke with patient's daughter, [name] [phone]), regarding concerns about patient. [name] reports patient had difficulty care for herself at home. [name] reports patient has been defecating around the house and states the home is "inhabitable" because of this. [name] feels that home health will not be good due to the cleanliness of the home and patient has had home health in the past and during that time patient would refuse to pay for home health and ask the workers to buy her alcohol. [name] reports there are issues with patient's insurance at this time. She reports they are currently seeking legal guardianship of patient. [name] reports patient drinks about a box of wine a day (patient stated to CSW that she drink 4-5 glasses of wine 2-3x weekly). CSW explained to [name] that patient will need to address her alcohol use as this can be a barrier for inpatient/residential care. CSW to provide patient's daughter with list for SA facilities as well as nursing home facilities and home health.   Expected Discharge Plan: Home/Self Care Barriers to Discharge: No Barriers Identified   Patient Goals and CMS Choice Patient states their goals for this hospitalization and ongoing recovery are:: To better care for self and get rid of pain CMS Medicare.gov Compare Post Acute Care list provided to:: Patient(copy also given to daughter)    Expected Discharge Plan and Services Expected Discharge Plan: Home/Self Care       Living arrangements for the past 2 months: Single Family Home                                      Prior Living Arrangements/Services Living arrangements for the past 2 months: Single Family Home Lives with::  Self Patient language and need for interpreter reviewed:: Yes Do you feel safe going back to the place where you live?: Yes      Need for Family Participation in Patient Care: Yes (Comment) Care giver support system in place?: Yes (comment)   Criminal Activity/Legal Involvement Pertinent to Current Situation/Hospitalization: No - Comment as needed  Activities of Daily Living      Permission Sought/Granted Permission sought to share information with : Family Supports Permission granted to share information with : Yes, Verbal Permission Granted  Share Information with NAME: [name]     Permission granted to share info w Relationship: daughter     Emotional Assessment Appearance:: Appears stated age Attitude/Demeanor/Rapport: Engaged Affect (typically observed): Flat Orientation: : Oriented to Self, Oriented to Place, Oriented to  Time, Oriented to Situation Alcohol / Substance Use: Alcohol Use Psych Involvement: No (comment)  Admission diagnosis:  pain Patient Active Problem List   Diagnosis Date Noted  . Cellulitis of lower extremity   . Peripheral edema   . Atrial fibrillation with RVR (HCC) 06/13/2018  . Alcohol dependence (HCC) 05/06/2018  . Electrolyte disturbance 05/05/2018  . Alcohol withdrawal delirium (HCC) 04/28/2018  . Weakness generalized 04/27/2018  . Gait abnormality 04/24/2018  . Chronic hypoxemic respiratory failure (HCC) 04/11/2018  . Atrial fibrillation with rapid ventricular response (HCC) 03/23/2018  . Head  injury   . Fall 03/19/2018  . Syncope 03/19/2018  . Orbital fracture 03/19/2018  . Scalp laceration 03/19/2018  . Acute respiratory failure with hypoxia and hypercarbia ([place]) 03/19/2018  . Abdominal tenderness 03/19/2018  . Acute on chronic respiratory failure with hypoxia ([place]) 03/17/2018  . Alcohol abuse with intoxication ([place]) 03/17/2018  . ETOH abuse   . Normocytic anemia 03/16/2018  . Acute metabolic encephalopathy 26/83/4196  . Lower  extremity edema 03/16/2018  . Migraine 03/16/2018  . Community acquired pneumonia of left lower lobe of lung ([place])   . Weakness 08/21/2017  . COPD exacerbation ([place]) 06/12/2017  . Chest pain with moderate risk for cardiac etiology 05/30/2017  . Abnormal LFTs 05/30/2017  . Tachycardia 05/30/2017  . COPD (chronic obstructive pulmonary disease) ([place]) 04/19/2017  . Right rib fracture 04/15/2017  . Abdominal wall cellulitis 12/30/2016  . S/P exploratory laparotomy 12/30/2016  . Distal radius fracture, left 09/09/2016  . Thrombocytopenia ([place]) 09/09/2016  . Chest x-ray abnormality   . Umbilical hernia without obstruction and without gangrene 05/12/2016  . Itching 02/24/2016  . Ventral hernia without obstruction or gangrene 02/24/2016  . Palliative care encounter   . Ascites   . Tachypnea   . Atrial flutter with rapid ventricular response ([place])   . Hypomagnesemia   . Hypoxia   . Dehydration 01/22/2016  . Low back ache 12/30/2015  . Non-intractable vomiting with nausea   . Alcohol abuse with alcohol-induced mood disorder ([place]) 12/15/2015  . Acute alcoholism ([place])   . Distended abdomen 11/04/2015  . Alcohol abuse   . Dyspnea   . Acute respiratory failure ([place]) 10/15/2015  . Ascites due to alcoholic cirrhosis ([place]) 22/29/7989  . Osteoporosis   . Multiple sclerosis ([place])   . Alcoholic cirrhosis of liver with ascites ([place]) 08/22/2015  . Bipolar disorder, current episode mixed, moderate ([place]) 08/01/2015  . Alcohol use disorder, severe, dependence ([place]) 07/31/2015  . Macrocytic anemia- due to alcohol abuse with normal B12 & folate levels 05/31/2015  . Severe protein-calorie malnutrition ([place]) 05/31/2015  . Hypokalemia 05/26/2015  . Encephalopathy, hepatic ([place]) 05/26/2015  . Alcohol withdrawal ([place]) 05/18/2015  . Abdominal pain 05/18/2015  . Anxiety 05/18/2015  . Stimulant abuse ([place]) 12/27/2014  . Nicotine abuse 12/27/2014  . Hyperprolactinemia ([place]) 11/15/2014  . Dyslipidemia    . Alcohol use disorder, moderate, dependence ([place]) 10/30/2014  . Tobacco abuse 01/23/2014  . Bipolar 1 disorder ([place]) 04/07/2013  . Noncompliance with therapeutic plan 04/04/2013  . Hereditary and idiopathic peripheral neuropathy 05/01/2012  . Depression 05/01/2012  . GERD (gastroesophageal reflux disease) 05/01/2012  . Osteoarthrosis, unspecified whether generalized or localized, involving lower leg 05/01/2012  . Hyponatremia 07/01/2011   PCP:  Patient, No Pcp Per Pharmacy:   [facility] [place], [place] - [address] [place] [address] [place] [address] [place] [address] Phone: [phone] Fax: [phone]     Social Determinants of Health (SDOH) Interventions    Readmission Risk Interventions No flowsheet data found.

## 2018-07-25 ENCOUNTER — Encounter (HOSPITAL_COMMUNITY): Payer: Self-pay | Admitting: Emergency Medicine

## 2018-07-25 ENCOUNTER — Emergency Department (HOSPITAL_COMMUNITY)
Admission: EM | Admit: 2018-07-25 | Discharge: 2018-07-26 | Disposition: A | Discharge status: Home / Self Care | Payer: Self-pay | Attending: Emergency Medicine | Admitting: Emergency Medicine

## 2018-07-25 DIAGNOSIS — F17210 Nicotine dependence, cigarettes, uncomplicated: Secondary | ICD-10-CM | POA: Insufficient documentation

## 2018-07-25 DIAGNOSIS — J449 Chronic obstructive pulmonary disease, unspecified: Secondary | ICD-10-CM | POA: Insufficient documentation

## 2018-07-25 DIAGNOSIS — Z79899 Other long term (current) drug therapy: Secondary | ICD-10-CM | POA: Insufficient documentation

## 2018-07-25 DIAGNOSIS — F1010 Alcohol abuse, uncomplicated: Secondary | ICD-10-CM | POA: Insufficient documentation

## 2018-07-25 LAB — ETHANOL: Alcohol, Ethyl (B): 10 mg/dL — ABNORMAL HIGH (ref <10)

## 2018-07-25 LAB — CBC WITH DIFFERENTIAL/PLATELET
Abs Immature Granulocytes: 0.01 10*3/uL (ref 0.00–0.07)
Basophils Absolute: 0 10*3/uL (ref 0.0–0.1)
Basophils Relative: 1 %
Eosinophils Absolute: 0 10*3/uL (ref 0.0–0.5)
Eosinophils Relative: 0 %
HCT: 28.2 % — ABNORMAL LOW (ref 36.0–46.0)
Hemoglobin: 8.7 g/dL — ABNORMAL LOW (ref 12.0–15.0)
Immature Granulocytes: 0 %
Lymphocytes Relative: 14 %
Lymphs Abs: 0.7 10*3/uL (ref 0.7–4.0)
MCH: 25.1 pg — ABNORMAL LOW (ref 26.0–34.0)
MCHC: 30.9 g/dL (ref 30.0–36.0)
MCV: 81.3 fL (ref 80.0–100.0)
Monocytes Absolute: 0.7 10*3/uL (ref 0.1–1.0)
Monocytes Relative: 14 %
Neutro Abs: 3.3 10*3/uL (ref 1.7–7.7)
Neutrophils Relative %: 71 %
Platelets: 91 10*3/uL — ABNORMAL LOW (ref 150–400)
RBC: 3.47 MIL/uL — ABNORMAL LOW (ref 3.87–5.11)
RDW: 21.2 % — ABNORMAL HIGH (ref 11.5–15.5)
WBC: 4.6 10*3/uL (ref 4.0–10.5)
nRBC: 0 % (ref 0.0–0.2)

## 2018-07-25 LAB — RAPID URINE DRUG SCREEN, HOSP PERFORMED
Amphetamines: NOT DETECTED
Barbiturates: NOT DETECTED
Benzodiazepines: POSITIVE — AB
Cocaine: NOT DETECTED
Opiates: NOT DETECTED
Tetrahydrocannabinol: NOT DETECTED

## 2018-07-25 LAB — COMPREHENSIVE METABOLIC PANEL
ALT: 29 U/L (ref 0–44)
AST: 70 U/L — ABNORMAL HIGH (ref 15–41)
Albumin: 2.4 g/dL — ABNORMAL LOW (ref 3.5–5.0)
Alkaline Phosphatase: 166 U/L — ABNORMAL HIGH (ref 38–126)
Anion gap: 12 (ref 5–15)
BUN: <5 mg/dL — ABNORMAL LOW (ref 6–20)
CO2: 26 mmol/L (ref 22–32)
Calcium: 7.8 mg/dL — ABNORMAL LOW (ref 8.9–10.3)
Chloride: 90 mmol/L — ABNORMAL LOW (ref 98–111)
Creatinine, Ser: 0.41 mg/dL — ABNORMAL LOW (ref 0.44–1.00)
GFR calc Af Amer: >60 mL/min (ref >60)
GFR calc non Af Amer: >60 mL/min (ref >60)
Glucose, Bld: 99 mg/dL (ref 70–99)
Potassium: 3.8 mmol/L (ref 3.5–5.1)
Sodium: 128 mmol/L — ABNORMAL LOW (ref 135–145)
Total Bilirubin: 3.5 mg/dL — ABNORMAL HIGH (ref 0.3–1.2)
Total Protein: 6.1 g/dL — ABNORMAL LOW (ref 6.5–8.1)

## 2018-07-25 MED ORDER — LORAZEPAM 2 MG/ML IJ SOLN: 0.0000 mg | Freq: Four times a day (QID) | INTRAMUSCULAR | Status: DC

## 2018-07-25 MED ORDER — GABAPENTIN 100 MG PO CAPS
200.0000 mg | ORAL_CAPSULE | Freq: Three times a day (TID) | ORAL | Status: DC
  Administered 2018-07-25 – 2018-07-26 (×2): 200 mg via ORAL
  Filled 2018-07-25 (×2): qty 2

## 2018-07-25 MED ORDER — VITAMIN B-1 100 MG PO TABS
100.0000 mg | ORAL_TABLET | Freq: Every day | ORAL | Status: DC
  Administered 2018-07-25 – 2018-07-26 (×2): 100 mg via ORAL
  Filled 2018-07-25 (×2): qty 1

## 2018-07-25 MED ORDER — FUROSEMIDE 40 MG PO TABS
40.0000 mg | ORAL_TABLET | Freq: Every day | ORAL | Status: DC
  Filled 2018-07-25 (×2): qty 1

## 2018-07-25 MED ORDER — ACETAMINOPHEN 500 MG PO TABS
1000.0000 mg | ORAL_TABLET | Freq: Four times a day (QID) | ORAL | Status: DC | PRN
  Administered 2018-07-26: 1000 mg via ORAL
  Filled 2018-07-25: qty 2

## 2018-07-25 MED ORDER — ONDANSETRON 4 MG PO TBDP
4.0000 mg | ORAL_TABLET | Freq: Once | ORAL | Status: AC
  Administered 2018-07-25: 4 mg via ORAL
  Filled 2018-07-25: qty 1

## 2018-07-25 MED ORDER — ONDANSETRON 8 MG PO TBDP
8.0000 mg | ORAL_TABLET | Freq: Once | ORAL | Status: AC
  Administered 2018-07-25: 8 mg via ORAL
  Filled 2018-07-25: qty 1

## 2018-07-25 MED ORDER — LORAZEPAM 1 MG PO TABS: 0.0000 mg | ORAL_TABLET | Freq: Two times a day (BID) | ORAL | Status: DC

## 2018-07-25 MED ORDER — THIAMINE HCL 100 MG/ML IJ SOLN: 100.0000 mg | Freq: Every day | INTRAMUSCULAR | Status: DC

## 2018-07-25 MED ORDER — METHOCARBAMOL 500 MG PO TABS: 500.0000 mg | ORAL_TABLET | Freq: Two times a day (BID) | ORAL | Status: DC | PRN

## 2018-07-25 MED ORDER — LORAZEPAM 1 MG PO TABS
0.0000 mg | ORAL_TABLET | Freq: Four times a day (QID) | ORAL | Status: DC
  Administered 2018-07-25 (×2): 2 mg via ORAL
  Administered 2018-07-26: 1 mg via ORAL
  Filled 2018-07-25: qty 2
  Filled 2018-07-25: qty 1
  Filled 2018-07-25: qty 2

## 2018-07-25 MED ORDER — LORAZEPAM 2 MG/ML IJ SOLN: 0.0000 mg | Freq: Two times a day (BID) | INTRAMUSCULAR | Status: DC

## 2018-07-25 MED ORDER — FAMOTIDINE 20 MG PO TABS
20.0000 mg | ORAL_TABLET | Freq: Every day | ORAL | Status: DC
  Administered 2018-07-25 – 2018-07-26 (×2): 20 mg via ORAL
  Filled 2018-07-25 (×2): qty 1

## 2018-07-25 MED ORDER — DILTIAZEM HCL ER COATED BEADS 120 MG PO CP24
120.0000 mg | ORAL_CAPSULE | Freq: Every day | ORAL | Status: DC
  Administered 2018-07-25 – 2018-07-26 (×2): 120 mg via ORAL
  Filled 2018-07-25 (×2): qty 1

## 2018-07-25 MED ORDER — MAGNESIUM OXIDE 400 (241.3 MG) MG PO TABS
400.0000 mg | ORAL_TABLET | Freq: Every day | ORAL | Status: DC
  Administered 2018-07-25 – 2018-07-26 (×2): 400 mg via ORAL
  Filled 2018-07-25 (×2): qty 1

## 2018-07-25 MED ORDER — SPIRONOLACTONE 25 MG PO TABS
25.0000 mg | ORAL_TABLET | Freq: Every day | ORAL | Status: DC
  Administered 2018-07-26: 25 mg via ORAL
  Filled 2018-07-25 (×2): qty 1

## 2018-07-25 MED ORDER — PANTOPRAZOLE SODIUM 40 MG PO TBEC
40.0000 mg | DELAYED_RELEASE_TABLET | Freq: Every day | ORAL | Status: DC
  Administered 2018-07-25 – 2018-07-26 (×2): 40 mg via ORAL
  Filled 2018-07-25 (×2): qty 1

## 2018-07-25 NOTE — ED Provider Notes (Signed)
[facility]-EMERGENCY DEPT Provider Note   CSN: [id number] Arrival date & time: 07/25/18  1115     History   Chief Complaint Chief Complaint  Patient presents with  . Medical Clearance    HPI Anne Black is a 58 y.o. female who presents with nausea. PMH significant for frequent ED visits, failure to thrive, ETOH abuse, COPD, A.fib. The patient has a habit of calling EMS for non-emergent complaints such as getting them to get her food and open her wine. She has called EMS >200 times. APS and family are involved. She has a competency hearing on June 30th at the courts but this keeps getting pushed back due to COVID. Multiple facilities have declined to take the patient because she frequently leaves AMA to go home and drink alcohol. EMS has expressed concerns that the patient isn't able to care for herself because there are bugs on the floor, foot rotting, and she is urinating and defecating on herself. She continues to drink alcohol daily. The patient is reporting abdominal pain and nausea. She has not drank alcohol since yesterday. Her husband, Azucena KubaReid, is requesting assistance with getting her in to a facility. The patient would like help with her alcoholism.    HPI  Past Medical History:  Diagnosis Date  . ADHD (attention deficit hyperactivity disorder)   . Alcoholic cirrhosis (HCC)    with ascites  . Alcoholism (HCC)   . Allergies   . Anxiety   . Arthritis   . Atrial fibrillation with RVR (HCC)   . Bipolar disorder (HCC)   . COPD (chronic obstructive pulmonary disease) (HCC)    wears 2L chronic O2  . Dyspnea   . EtOH dependence (HCC) 05/14/2018  . GERD (gastroesophageal reflux disease)   . History of hiatal hernia   . Hypokalemia   . Migraine   . Multiple sclerosis (HCC)   . Narcolepsy   . Osteoarthritis of knee   . Osteoporosis   . Pneumonia     Patient Active Problem List   Diagnosis Date Noted  . Cellulitis of lower extremity   . Peripheral  edema   . Atrial fibrillation with RVR (HCC) 06/13/2018  . Alcohol dependence (HCC) 05/06/2018  . Electrolyte disturbance 05/05/2018  . Alcohol withdrawal delirium (HCC) 04/28/2018  . Weakness generalized 04/27/2018  . Gait abnormality 04/24/2018  . Chronic hypoxemic respiratory failure (HCC) 04/11/2018  . Atrial fibrillation with rapid ventricular response (HCC) 03/23/2018  . Head injury   . Fall 03/19/2018  . Syncope 03/19/2018  . Orbital fracture 03/19/2018  . Scalp laceration 03/19/2018  . Acute respiratory failure with hypoxia and hypercarbia (HCC) 03/19/2018  . Abdominal tenderness 03/19/2018  . Acute on chronic respiratory failure with hypoxia (HCC) 03/17/2018  . Alcohol abuse with intoxication (HCC) 03/17/2018  . ETOH abuse   . Normocytic anemia 03/16/2018  . Acute metabolic encephalopathy 03/16/2018  . Lower extremity edema 03/16/2018  . Migraine 03/16/2018  . Community acquired pneumonia of left lower lobe of lung (HCC)   . Weakness 08/21/2017  . COPD exacerbation (HCC) 06/12/2017  . Chest pain with moderate risk for cardiac etiology 05/30/2017  . Abnormal LFTs 05/30/2017  . Tachycardia 05/30/2017  . COPD (chronic obstructive pulmonary disease) (HCC) 04/19/2017  . Right rib fracture 04/15/2017  . Abdominal wall cellulitis 12/30/2016  . S/P exploratory laparotomy 12/30/2016  . Distal radius fracture, left 09/09/2016  . Thrombocytopenia (HCC) 09/09/2016  . Chest x-ray abnormality   . Umbilical hernia without obstruction and  without gangrene 05/12/2016  . Itching 02/24/2016  . Ventral hernia without obstruction or gangrene 02/24/2016  . Palliative care encounter   . Ascites   . Tachypnea   . Atrial flutter with rapid ventricular response ([place])   . Hypomagnesemia   . Hypoxia   . Dehydration 01/22/2016  . Low back ache 12/30/2015  . Non-intractable vomiting with nausea   . Alcohol abuse with alcohol-induced mood disorder ([place]) 12/15/2015  . Acute alcoholism ([place])    . Distended abdomen 11/04/2015  . Alcohol abuse   . Dyspnea   . Acute respiratory failure ([place]) 10/15/2015  . Ascites due to alcoholic cirrhosis ([place]) 40/97/3532  . Osteoporosis   . Multiple sclerosis ([place])   . Alcoholic cirrhosis of liver with ascites ([place]) 08/22/2015  . Bipolar disorder, current episode mixed, moderate ([place]) 08/01/2015  . Alcohol use disorder, severe, dependence ([place]) 07/31/2015  . Macrocytic anemia- due to alcohol abuse with normal B12 & folate levels 05/31/2015  . Severe protein-calorie malnutrition ([place]) 05/31/2015  . Hypokalemia 05/26/2015  . Encephalopathy, hepatic ([place]) 05/26/2015  . Alcohol withdrawal ([place]) 05/18/2015  . Abdominal pain 05/18/2015  . Anxiety 05/18/2015  . Stimulant abuse ([place]) 12/27/2014  . Nicotine abuse 12/27/2014  . Hyperprolactinemia ([place]) 11/15/2014  . Dyslipidemia   . Alcohol use disorder, moderate, dependence ([place]) 10/30/2014  . Tobacco abuse 01/23/2014  . Bipolar 1 disorder ([place]) 04/07/2013  . Noncompliance with therapeutic plan 04/04/2013  . Hereditary and idiopathic peripheral neuropathy 05/01/2012  . Depression 05/01/2012  . GERD (gastroesophageal reflux disease) 05/01/2012  . Osteoarthrosis, unspecified whether generalized or localized, involving lower leg 05/01/2012  . Hyponatremia 07/01/2011    Past Surgical History:  Procedure Laterality Date  . ABDOMINAL WALL DEFECT REPAIR N/A 12/30/2016   Procedure: EXPLORATORY LAPAROTOMY WITH REPAIR ABDOMINAL WALL VENTRAL HERNIA;  Surgeon: Rolm Bookbinder, MD;  Location: [facility];  Service: General;  Laterality: N/A;  . APPLICATION OF WOUND VAC N/A 12/30/2016   Procedure: APPLICATION OF WOUND VAC;  Surgeon: Rolm Bookbinder, MD;  Location: [facility];  Service: General;  Laterality: N/A;  . CESAREAN SECTION  [phone]  . FRACTURE SURGERY    . HERNIA REPAIR    . IR PARACENTESIS  06/14/2018  . MYRINGOTOMY WITH TUBE PLACEMENT Bilateral   . ORIF WRIST FRACTURE Left 09/09/2016   Procedure:  OPEN REDUCTION INTERNAL FIXATION (ORIF) LEFT WRIST FRACTURE, LEFT CARPAL TUNNEL RELEASE;  Surgeon: Roseanne Kaufman, MD;  Location: [facility];  Service: Orthopedics;  Laterality: Left;  . TONSILLECTOMY       OB History   No obstetric history on file.      Home Medications    Prior to Admission medications   Medication Sig Start Date End Date Taking? Authorizing Provider  acetaminophen (TYLENOL) 500 MG tablet Take 1 tablet (500 mg total) by mouth every 6 (six) hours as needed for mild pain. Patient taking differently: Take 1,000 mg by mouth every 6 (six) hours as needed for mild pain.  03/28/18   Lavina Hamman, MD  diltiazem (CARDIZEM CD) 120 MG 24 hr capsule Take 1 capsule (120 mg total) by mouth daily for 30 days. 04/14/18 07/01/18  Tegeler, Gwenyth Allegra, MD  famotidine (PEPCID) 20 MG tablet Take 1 tablet (20 mg total) by mouth daily for 14 days. 07/24/18 08/07/18  Caccavale, Sophia, PA-C  furosemide (LASIX) 40 MG tablet Take 1 tablet (40 mg total) by mouth daily. 06/27/18   Maudie Flakes, MD  gabapentin (NEURONTIN) 100 MG capsule Take 2 capsules (200  mg total) by mouth 3 (three) times daily for 30 days. 07/19/18 08/18/18  Fayrene Helperran, Bowie, PA-C  hydrOXYzine (ATARAX/VISTARIL) 25 MG tablet Take 1 tablet (25 mg total) by mouth daily as needed for up to 30 doses (anxiety). Patient not taking: Reported on 07/24/2018 04/14/18   Tegeler, Canary Brimhristopher J, MD  magnesium oxide (MAG-OX) 400 MG tablet Take 400 mg by mouth daily.    [provider]  methocarbamol (ROBAXIN) 500 MG tablet Take 1 tablet (500 mg total) by mouth 2 (two) times daily as needed for muscle spasms. 06/27/18   Sabas SousBero, Michael M, MD  ondansetron (ZOFRAN) 4 MG tablet Take 4 mg by mouth every 8 (eight) hours as needed for nausea or vomiting.    [provider]  pantoprazole (PROTONIX) 40 MG tablet Take 1 tablet (40 mg total) by mouth daily for 30 days. 07/19/18 08/18/18  Fayrene Helperran, Bowie, PA-C  potassium chloride SA (K-DUR,KLOR-CON) 20 MEQ  tablet Take 2 tablets (40 mEq total) by mouth 2 (two) times daily. 04/19/18   Bethel BornGekas,  Marie, PA-C  spironolactone (ALDACTONE) 25 MG tablet Take 1 tablet (25 mg total) by mouth daily for 30 days. 06/27/18 07/27/18  Sabas SousBero, Michael M, MD    Family History Family History  Problem Relation Age of Onset  . Arrhythmia Mother   . Neuropathy Mother   . Coronary artery disease Mother   . Heart disease Mother   . Heart disease Father   . Hypertension Father   . Heart attack Father   . Multiple sclerosis Maternal Aunt     Social History Social History   Tobacco Use  . Smoking status: Current Every Day Smoker    Packs/day: 1.00    Years: 36.00    Pack years: 36.00    Types: Cigarettes  . Smokeless tobacco: Never Used  Substance Use Topics  . Alcohol use: Yes    Comment: Chronic ETOH abuse  . Drug use: No     Allergies   Patient has no known allergies.   Review of Systems Review of Systems  Constitutional: Negative for fever.  Respiratory: Negative for shortness of breath.   Cardiovascular: Negative for chest pain.  Gastrointestinal: Positive for abdominal pain and nausea.  Genitourinary: Positive for difficulty urinating and dysuria.  Neurological: Negative for headaches.  All other systems reviewed and are negative.    Physical Exam Updated Vital Signs BP (!) 123/58 (BP Location: Left Arm)   Pulse (!) 103   Temp 98.2 F (36.8 C) (Oral)   Resp 20   SpO2 100%   Physical Exam Vitals signs and nursing note reviewed.  Constitutional:      General: She is not in acute distress.    Appearance: She is well-developed. She is obese. She is not ill-appearing.     Comments: Disheveled. Conversant  HENT:     Head: Normocephalic and atraumatic.  Eyes:     General: No scleral icterus.       Right eye: No discharge.        Left eye: No discharge.     Conjunctiva/sclera: Conjunctivae normal.     Pupils: Pupils are equal, round, and reactive to light.  Neck:      Musculoskeletal: Normal range of motion.  Cardiovascular:     Rate and Rhythm: Regular rhythm. Tachycardia present.  Pulmonary:     Effort: Pulmonary effort is normal. No respiratory distress.     Breath sounds: Normal breath sounds.  Abdominal:     General: There is  no distension.     Palpations: Abdomen is soft.     Tenderness: There is no abdominal tenderness.  Skin:    General: Skin is warm and dry.     Comments: Multiple bruises on the arms  Neurological:     Mental Status: She is alert and oriented to person, place, and time.  Psychiatric:        Behavior: Behavior normal.      ED Treatments / Results  Labs (all labs ordered are listed, but only abnormal results are displayed) Labs Reviewed  COMPREHENSIVE METABOLIC PANEL - Abnormal; Notable for the following components:      Result Value   Sodium 128 (*)    Chloride 90 (*)    BUN <5 (*)    Creatinine, Ser 0.41 (*)    Calcium 7.8 (*)    Total Protein 6.1 (*)    Albumin 2.4 (*)    AST 70 (*)    Alkaline Phosphatase 166 (*)    Total Bilirubin 3.5 (*)    All other components within normal limits  ETHANOL - Abnormal; Notable for the following components:   Alcohol, Ethyl (B) 10 (*)    All other components within normal limits  RAPID URINE DRUG SCREEN, HOSP PERFORMED - Abnormal; Notable for the following components:   Benzodiazepines POSITIVE (*)    All other components within normal limits  CBC WITH DIFFERENTIAL/PLATELET - Abnormal; Notable for the following components:   RBC 3.47 (*)    Hemoglobin 8.7 (*)    HCT 28.2 (*)    MCH 25.1 (*)    RDW 21.2 (*)    Platelets 91 (*)    All other components within normal limits    EKG None  Radiology Dg Chest Port 1 View  Result Date: 07/23/2018 CLINICAL DATA:  Hypoxia EXAM: PORTABLE CHEST 1 VIEW COMPARISON:  06/30/2018 FINDINGS: Heart and mediastinal contours are within normal limits. No focal opacities or effusions. No acute bony abnormality. IMPRESSION: No active  disease. Electronically Signed   By: Charlett NoseKevin  Dover M.D.   On: 07/23/2018 19:31    Procedures Procedures (including critical care time)  Medications Ordered in ED Medications  LORazepam (ATIVAN) injection 0-4 mg ( Intravenous See Alternative 07/25/18 1354)    Or  LORazepam (ATIVAN) tablet 0-4 mg (2 mg Oral Given 07/25/18 1354)  LORazepam (ATIVAN) injection 0-4 mg (has no administration in time range)    Or  LORazepam (ATIVAN) tablet 0-4 mg (has no administration in time range)  thiamine (VITAMIN B-1) tablet 100 mg (100 mg Oral Given 07/25/18 1354)    Or  thiamine (B-1) injection 100 mg ( Intravenous See Alternative 07/25/18 1354)  ondansetron (ZOFRAN-ODT) disintegrating tablet 8 mg (8 mg Oral Given 07/25/18 1311)     Initial Impression / Assessment and Plan / ED Course  I have reviewed the triage vital signs and the nursing notes.  Pertinent labs & imaging results that were available during my care of the patient were reviewed by me and considered in my medical decision making (see chart for details).  58 year old female presents with abdominal pain, nausea, alcoholism.  She is mildly tachycardic but otherwise vital signs are normal.  She is alert and oriented on exam.  Lungs are clear to auscultation.  Abdomen is soft and nontender.  She complains of nausea which is a frequent complaint for her.  Will obtain medical clearance labs and give Zofran.  We will also discuss with case management and social work regarding disposition  CBC is remarkable for anemia (8.7) which is around her baseline and thrombocytopenia (91).  CMP is remarkable for hyponatremia, elevated alkaline phosphatase and LFTs, and bilirubin which are also chronically elevated due to her alcoholism.  EtOH is 10.  UDS is positive for benzos.  EKG is sinus tachycardia.  Case management set the appointment up with the PCP on June 23.  This was discussed with the patient's husband who is unsatisfied because he feels that the patient  was just going to go home and drink more alcohol and feels she is unsafe.  Social work was consulted who attempted to call several different facilities who declined placement or were out-of-state.  I discussed with the patient's husband Renato Gails who called a facility called [facility] in [place] who are willing to do a direct admission.  They are a dual psych and detox facility.  They are requesting the patient's facesheet, clinicals, labs from today.  Overall the patient appears stable medically at this time.   [facility] [phone]   Final Clinical Impressions(s) / ED Diagnoses   Final diagnoses:  Alcohol abuse    ED Discharge Orders    None       Bethel Born, PA-C 07/25/18 1750    Virgina Norfolk, DO 07/26/18 [phone]

## 2018-07-25 NOTE — Progress Notes (Addendum)
3:35p -  CSW received call from patient's husband stating that he may have a facility for patient to go in [place] and patient will have to complete a phone assessment. CSW explained there are no phone's in patient room and asked if this can be done once patient is discharged. Patient's husband is willing to come pick patient up upon discharge. Patient's husband asked that he have an AVS to give to the facility he found in [place]. CSW made PA and RN who is working with patient aware of this. CSW signing off. Please reconsult for any other social work needs.     2:56p - CSW spoke with patient at bedside to explain her options for SA treatment and informed patient that the local SA facilities have no bed avilability ([facility] and [facility]) and the only other facilities that may take patient are out of state. CSW asked patient about her insurance. CSW called patient's husband, Joneen Caraway, at patient's request. Husband reports that patient's medical coverage with [employer] was discontinued by her employer and they are currently working with a lawyer to get it back. Patient still has secondary Medicare Part A per husband, but for patient to get treatment he plans to use self-pay. Husband reports that he was under the impression that patient was to be medically cleared her at the ED and then set up with an appointment with [facility]. Patient has an appointment on 6/23 at 10:45a. Husband was unhappy with this and hoping it would be same day to get patient evaluated there and in to a facility without any gaps in treatment. CSW explained that only appointments can and usually are not same day. CSW recommended patient can go to [facility] to be evaluated and explained patient will go through outpatient treatment with them and not be referred to inpatient, only if patient does not do well in the outpatient program (CSW called [facility] to confirm this).   Upon receiving consult, CSW spoke with TCU RN about patient. TCU RN states she  was told by EMS who brought patient that patient has an outside Education officer, museum who has a bed for her at a [place] facility. After further investigation this is not true. CSW spoke with patient's daughter, [name], who reports that patient does not have a bed anywhere. She reports patient has had SA treatment in the past and they attempted several times to get patient into a facility in [place] called [facility], but this may no longer be an option due to patient agreeing and then refusing to go several times. [name] reports she and patient's other daughter lives in the area. [name] also reports patient would have to detox at a facility in [place] to get into [facility] or possibly detox at any facility. [name] reports she is open to any placement for patient for patient to improve medically and mentally.   CSW spoke with patient who states that she is agreeable to alcohol treatment. She asked that CSW does not talk to her daughter's at this time and stated "this is personal."   [place], LCSW [facility] [phone]

## 2018-07-25 NOTE — ED Notes (Signed)
Patient still not able to give a urine sample.  Patient given food and fluids but is not eating anything, drinking fluids well.
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
## 2018-07-25 NOTE — ED Notes (Signed)
Patient c/o all over pain that she rates as a ten.  Asking for neurontin and tylenol.  PA notified.

## 2018-07-25 NOTE — ED Notes (Signed)
Patient urinated small amount of amber colored urine.

## 2018-07-25 NOTE — ED Notes (Signed)
Patient starting to have tremors again and is very restless.  PA notified.  Nurse told to do another CIWA and treat accordingly.

## 2018-07-25 NOTE — ED Notes (Signed)
Bed: HU31 Expected date:  Expected time:  Means of arrival:  Comments: EMS 57yo med clearance ETOH

## 2018-07-25 NOTE — ED Provider Notes (Addendum)
1734: Hand off from previous EDPA at shift change. See previous note for full details.   Patient presented to ER for placement at [facility] in [place]. Long history of poor social situation. Frequent ER visits. Family now getting involved will pay for placement, they are requesting direct admission to [facility] from ER. EDPA has contacted facility who is requesting labs, face sheet and ER visit documentation prior to accepting patient. We are waiting on labs to fax paperwork to [facility] who will review paperwork and determine if they will direct admit. If facility will not direct admit, husband states he will assume care and take her back from pending placement. She has upcoming competency hearing and [facility].   Plan is to f/u on [facility] for placement   Physical Exam  BP 122/65   Pulse (!) 111   Temp 98.5 F (36.9 C) (Oral)   Resp 18   SpO2 91%   ED Course/Procedures   Clinical Course as of Jul 25 2303  Tue Jul 25, 2018  2052 Spoke to [place] (?) in admission/intake at [facility] who reviewed patient's chart. Patient has not been accepted due to oxygen requirements and tubing facility cannot accept patient due to medical complexity. Will call husband   [CG]  2148 RN and myself have attempted to call husband multiple times to update, unsuccessful. CIWA now 5. Patient sleeping.   [CG]    Clinical Course User Index [CG] Kinnie Feil, PA-C    Procedures  MDM   2000: Work up reviewed and no significant abnormalities, at baseline.   2230: Pt evaluated. She is awake, in NAD. Oriented x 3. Provides appropriate history. Discussed status of placement so far and work up in [place]. Last ETOH was two days ago. She states she is really tired but has no other complaints. States she lives in her own home in [place]. She has oxygen available at home as needed but rarely uses it.    2300: unable to contact ex husband several times. RN  and SW unable to contact any family member.  I have spoken to [employer] (SW) at [facility] who states SW was in the process of finding placement for patient but husband requested admission to [facility] and SW stopped seeking placement.  Discussed with SW again and they will resume placement seeking in the AM.  VSS, mild tachycardia CIWA significantly improved, CIWA 5. No signs of severe alcohol withdrawals.  SpO2 >90%, will order  2 L that is her baseline need at home. Home meds have been ordered.      Kinnie Feil, PA-C 07/25/18 2307    Blanchie Dessert, MD 07/26/18 [phone]

## 2018-07-25 NOTE — ED Notes (Signed)
Patient continues to call on her call bell constantly.  Gave patient 2mg  Ativan po per CIWA protocol.  Her score is 14.  Patient with moderate tremors, anxiety, nausea, and abdominal pain.  Her nausea has improved after giving patient ordered zofran.  Patient is continually asking for a wheelchair to go home but is in no condition to take care of herself.  Patient asking questions and making demands over and over of staff.  Staff tries to meet patient's needs but patient still using call bell over and over.

## 2018-07-25 NOTE — ED Notes (Signed)
Burnell Blanks, pt's ex-husband, request to be called for further updates regarding placement of pt [phone]
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
## 2018-07-25 NOTE — ED Notes (Signed)
Pt alert pt c/o of generalized chronic pain. Pt asking for gabapentin. Dr. Maryan Rued made aware. Pt denies any SI/HI and AVH  at this time. Pt resting bed, will continue to monitor.

## 2018-07-25 NOTE — ED Triage Notes (Signed)
Patient from home to get medically cleared.  Her social worker has a bed for her in an alcohol treatment facility.  Patient's family requests that she not be brought home prior to be admitted into the facility and to call them if she needs a ride.  Multiple family member cooperating to get patient admitted to facility.
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
## 2018-07-26 NOTE — ED Provider Notes (Signed)
Assumed care of patient at change of shift, briefly, 58yo female awaiting SW consult. Unable to go home due to home conditions, family will not pick up, requires home O2. Pending courtcase for competency at the end of June. Hold for today awaiting SW disposition, consider dc to home via [facility]. Physical Exam  BP 119/74   Pulse 86   Temp 98.1 F (36.7 C) (Oral)   Resp 18   SpO2 91%   Physical Exam  ED Course/Procedures   Clinical Course as of Jul 25 924  Tue Jul 25, 2018  2052 Spoke to [place] (?) in admission/intake at [facility] who reviewed patient's chart. Patient has not been accepted due to oxygen requirements and tubing facility cannot accept patient due to medical complexity. Will call husband   [CG]  2148 RN and myself have attempted to call husband multiple times to update, unsuccessful. CIWA now 5. Patient sleeping.   [CG]    Clinical Course User Index [CG] Kinnie Feil, PA-C    Procedures  MDM  Social Worker has spoke with patient and her ex-husband, ex-husband will come and pick her up with plan to go back to her home.      Tacy Learn, PA-C 07/26/18 0926    Francine Graven, DO 07/27/18 [id number]

## 2018-07-26 NOTE — ED Provider Notes (Signed)
58 year old female received at signout from [place] pending social work consult.  In brief, the patient has a history of failure to thrive, alcohol abuse, COPD, and A. fib.  She is well-known to this ER with 49 visits in the last 6 months.  She is called EMS greater than 200 times for nonemergent complaints.  EMS is expressed concerns regarding the patient's deplorable living condition. she has a competency hearing on June 30 that has been pushed back multiple times due to [place]. APS and family are involved.  She returned to the ER yesterday and staff spoke with her ex-husband Joneen Caraway who is requesting assistance with getting her into a facility.  Family initially reports the patient had a bed at [facility] in [place], but there was concern regarding the patient's need for intermittent home oxygen so they declined the patient as they felt that her chronic medical conditions were too complex. If facility will not direct admit, husband states he will assume care and take her back from pending placement.  Unfortunately, social work has had difficulty contacting the patient's husband to pick her up from the ER.   Physical Exam  BP 119/74   Pulse 86   Temp 98.1 F (36.7 C) (Oral)   Resp 18   SpO2 91%   Physical Exam Vitals signs and nursing note reviewed.  Constitutional:      General: She is not in acute distress.    Appearance: She is well-developed. She is not ill-appearing, toxic-appearing or diaphoretic.     Comments: Nasal cannula in place.   HENT:     Head: Normocephalic and atraumatic.  Neck:     Musculoskeletal: Normal range of motion.  Pulmonary:     Effort: Pulmonary effort is normal.     Comments: Speaks in complete, fluent sentences. Musculoskeletal: Normal range of motion.  Neurological:     Mental Status: She is alert and oriented to person, place, and time.     Comments: No slurred speech.      ED Course/Procedures   Clinical Course as of Jul 26 718  Tue Jul 25, 2018  2052 Spoke to [place] (?) in admission/intake at [facility] who reviewed patient's chart. Patient has not been accepted due to oxygen requirements and tubing facility cannot accept patient due to medical complexity. Will call husband   [CG]  2148 RN and myself have attempted to call husband multiple times to update, unsuccessful. CIWA now 5. Patient sleeping.   [CG]    Clinical Course User Index [CG] Kinnie Feil, PA-C    Procedures  MDM   58 year old female with a history of alcohol use disorder and failure to thrive received at signout pending social work consult for assistance with discharge.  Patient is well-known to this ER with 49 visits in the last 6 months.  Previously, she has declined ED assistance for placement and has left against medical advised during recent admissions.  Yesterday, the patient came via EMS and family had requested the patient not be discharged to her home given concerns for deplorable living conditions.  She was thought to have placement at [facility] in [place], but was declined due to patient decisions chronic oxygen requirements.  Social work was unable to contact family prior to leaving at the end of the day and a previously stopped looking for placement given that the patient was initially accepted to [facility].  Social work consult is pending assistance with placement or appropriate disposition at this time.  On my evaluation, patient is resting comfortably in bed with nasal cannula in place.  States that she is feeling anxious and is requesting Tylenol for headache.  PRN orders have been placed.  Patient care transferred to PA. Murphy at the end of my shift. Patient presentation, ED course, and plan of care discussed with review of all pertinent labs and imaging. Please see his/her note for further details regarding further ED course and disposition.       Barkley Boards, PA-C 07/26/18 Cherylann Parr, April, MD 07/30/18  2329

## 2018-07-26 NOTE — Progress Notes (Addendum)
CSW reached out to [facility] after reading past notes and speaking with patient's husband. [facility] reports that they will not be able to accept patient at this time due to being medically complex.   CSW spoke with patient's husband about patient's barriers to placement. CSW provided patient's husband with medicare.gov website to search for nursing home facilities in the area that may be able to assist patient. CSW also provided number to obtain medical records to send to facilities if needed.  Barriers to placement: patient has no insurance, may have medicare part A? (per husband, willing to self-pay), no home health agency is agreeable to take patient due to past aggressive behaviors, patient may be too medically complex for SA treatment (patient uses oxygen intermittently), patient does not have a skillable need for SNF (patient able to ambulate on her own).   CSW has exhausted all resources for the patient and made all recommendations. Patient's husband agreed to pick patient up. CSW signing off.   [place], LCSW Transitions of Care Department [facility] ED [phone]

## 2018-07-28 ENCOUNTER — Emergency Department (HOSPITAL_COMMUNITY)
Admission: EM | Admit: 2018-07-28 | Discharge: 2018-07-28 | Disposition: A | Discharge status: Home / Self Care | Payer: Self-pay | Attending: Emergency Medicine | Admitting: Emergency Medicine

## 2018-07-28 ENCOUNTER — Other Ambulatory Visit: Payer: Self-pay

## 2018-07-28 ENCOUNTER — Encounter (HOSPITAL_COMMUNITY): Payer: Self-pay

## 2018-07-28 ENCOUNTER — Emergency Department (HOSPITAL_COMMUNITY)
Admission: EM | Admit: 2018-07-28 | Discharge: 2018-07-29 | Disposition: A | Discharge status: Home / Self Care | Payer: Self-pay | Attending: Emergency Medicine | Admitting: Emergency Medicine

## 2018-07-28 DIAGNOSIS — R600 Localized edema: Secondary | ICD-10-CM

## 2018-07-28 DIAGNOSIS — J449 Chronic obstructive pulmonary disease, unspecified: Secondary | ICD-10-CM | POA: Insufficient documentation

## 2018-07-28 DIAGNOSIS — R0602 Shortness of breath: Secondary | ICD-10-CM | POA: Insufficient documentation

## 2018-07-28 DIAGNOSIS — G35 Multiple sclerosis: Secondary | ICD-10-CM | POA: Insufficient documentation

## 2018-07-28 DIAGNOSIS — Z5321 Procedure and treatment not carried out due to patient leaving prior to being seen by health care provider: Secondary | ICD-10-CM | POA: Insufficient documentation

## 2018-07-28 DIAGNOSIS — Z590 Homelessness: Secondary | ICD-10-CM | POA: Insufficient documentation

## 2018-07-28 DIAGNOSIS — I4892 Unspecified atrial flutter: Secondary | ICD-10-CM | POA: Insufficient documentation

## 2018-07-28 DIAGNOSIS — R2240 Localized swelling, mass and lump, unspecified lower limb: Secondary | ICD-10-CM | POA: Insufficient documentation

## 2018-07-28 DIAGNOSIS — R1013 Epigastric pain: Secondary | ICD-10-CM | POA: Insufficient documentation

## 2018-07-28 DIAGNOSIS — F17210 Nicotine dependence, cigarettes, uncomplicated: Secondary | ICD-10-CM | POA: Insufficient documentation

## 2018-07-28 DIAGNOSIS — I4891 Unspecified atrial fibrillation: Secondary | ICD-10-CM | POA: Insufficient documentation

## 2018-07-28 IMAGING — US US PARACENTESIS
1 series · 11 of 11 positions shown · non-contrast
Comparison: Previous paracentesis.

MEDICATIONS:
10 cc 1% lidocaine.

COMPLICATIONS:
None immediate.

INDICATION: Recurrent ascites

EXAM:
ULTRASOUND-GUIDED PARACENTESIS
TECHNIQUE: Informed written consent was obtained from the patient after a
discussion of the risks, benefits and alternatives to treatment. A
timeout was performed prior to the initiation of the procedure.

[Series 1: us paracentesis · 0.28mm/px · 11 of 11 slices shown]
[im 1/11]
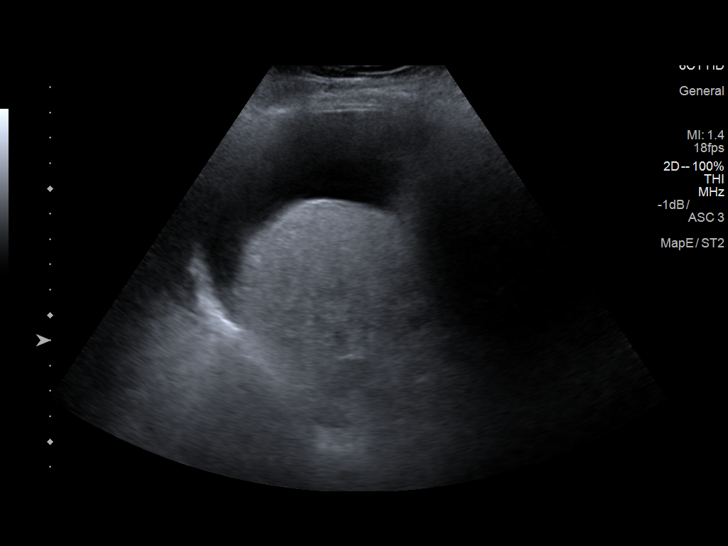
[im 2/11]
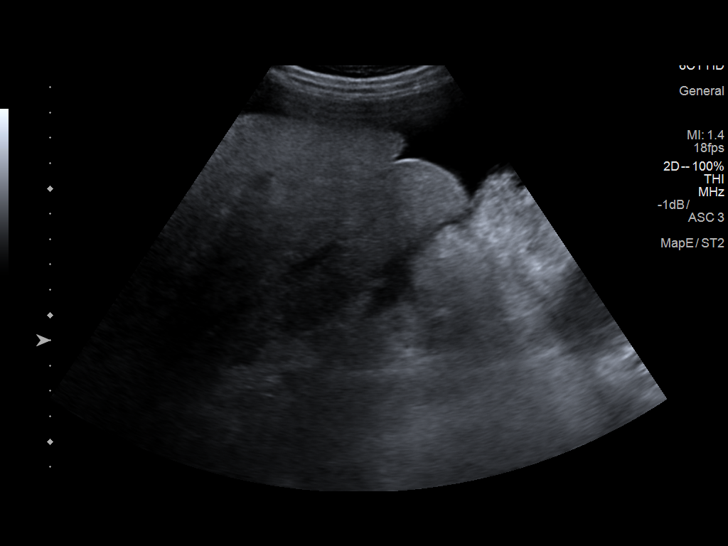
[im 3/11]
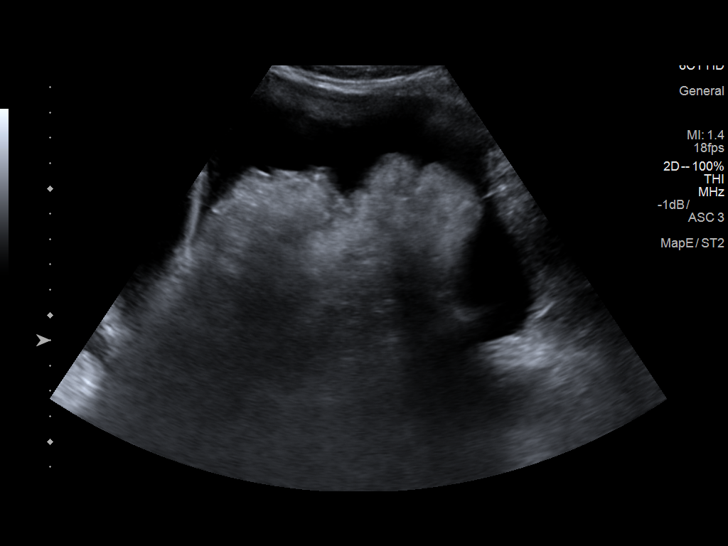
[im 4/11]
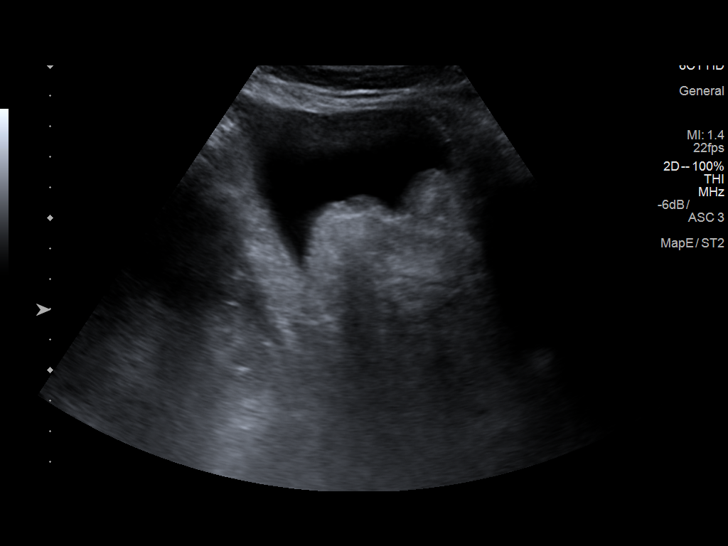
[im 5/11]
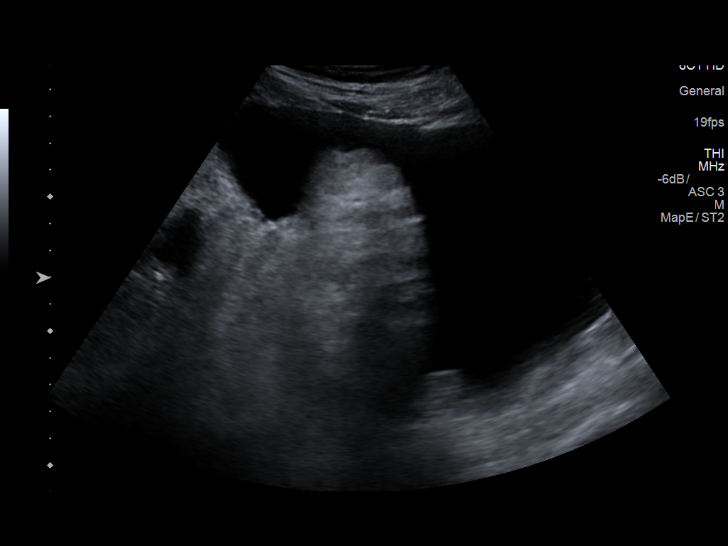
[im 6/11]
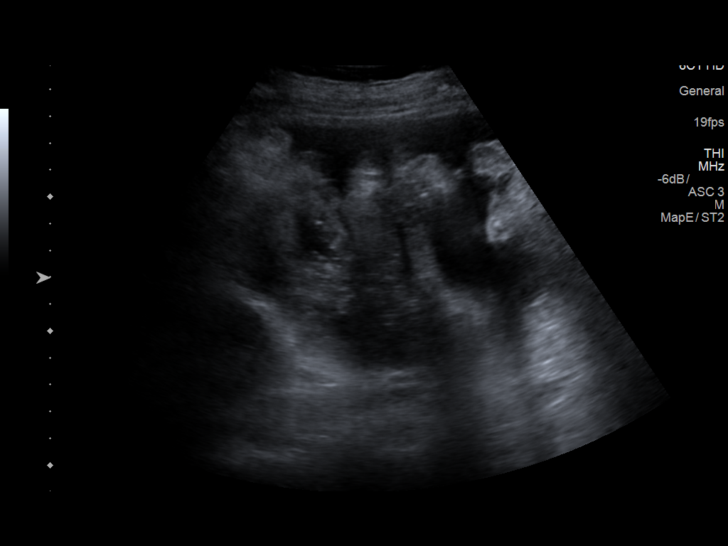
[im 7/11]
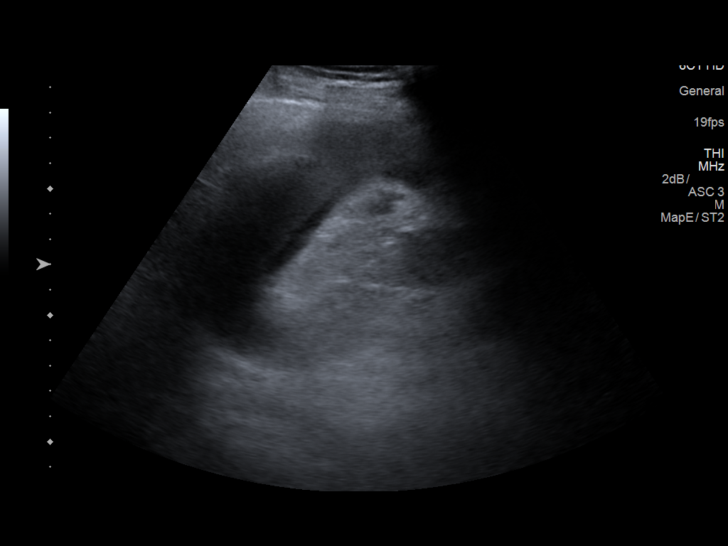
[im 8/11]
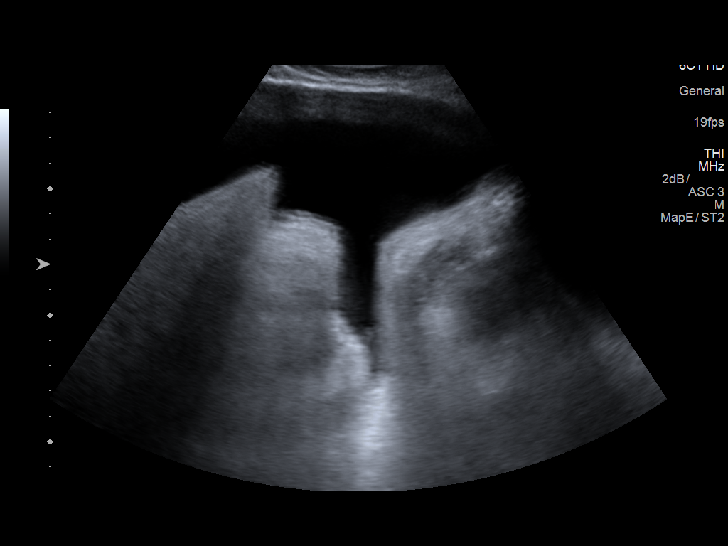
[im 9/11]
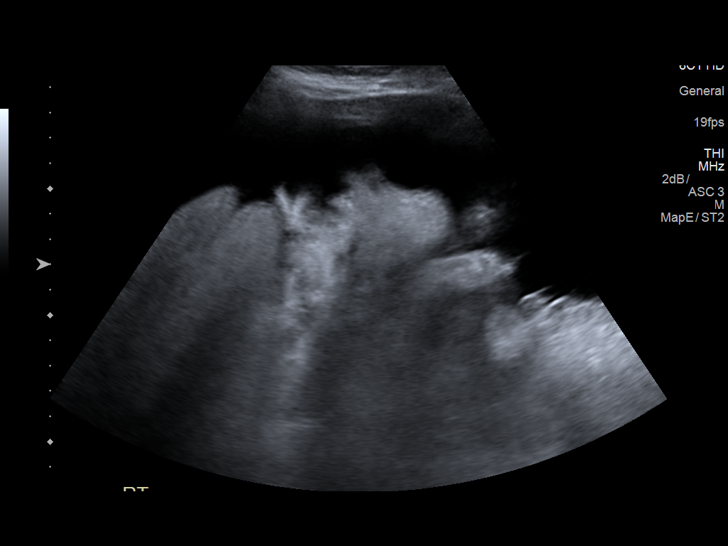
[im 10/11]
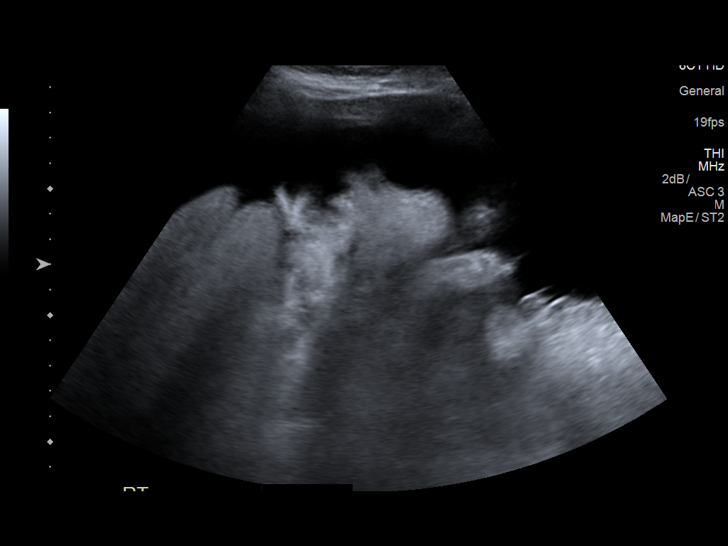
[im 11/11]
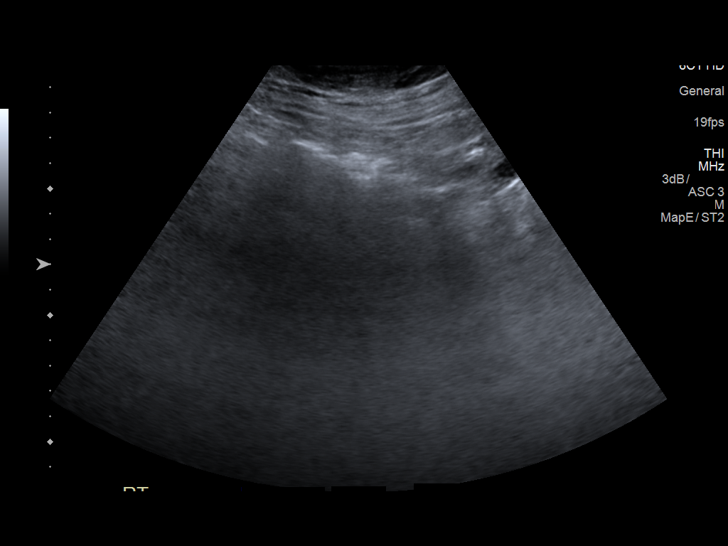

[11 of 11 positions shown; findings below may reference images not displayed]

Initial ultrasound scanning demonstrates a large amount of ascites
within the right lower abdominal quadrant. The right lower abdomen
was prepped and draped in the usual sterile fashion. 1% lidocaine
with epinephrine was used for local anesthesia. Under direct
ultrasound guidance, a 19 gauge, 7-cm, Yueh catheter was introduced.
An ultrasound image was saved for documentation purposed. The
paracentesis was performed. The catheter was removed and a dressing
was applied. The patient tolerated the procedure well without
immediate post procedural complication.
FINDINGS: A total of approximately 3.4 liters of yellow fluid was removed.
Samples were sent to the laboratory as requested by the clinical
team.
IMPRESSION: Successful ultrasound-guided paracentesis yielding 3.4 liters of
peritoneal fluid.

Read by:

Abhijeet Maldonado

## 2018-07-28 NOTE — ED Triage Notes (Signed)
Per EMS - Pt coming from home with SOB and significant generalized edema.   81/56 82HR  96% RA  20 R 97.5  CBG 200  22g lt hand

## 2018-07-28 NOTE — ED Notes (Signed)
[employer] arrived to speak with patient and she has decided that she would like to be seen again.

## 2018-07-28 NOTE — ED Notes (Signed)
Bed: LT90 Expected date:  Expected time:  Means of arrival:  Comments: EMS:  71 F SOB

## 2018-07-28 NOTE — ED Notes (Signed)
Anne Black arrived for patient. She states that she cant get in a van. Taxi company is sending a small car. This RN witness patient able to walk with assistance.   She requested to wait in the foyer of the ED because she states that the lobby is too cold. When asked if she still wants to be seen she says that she wants to leave.

## 2018-07-28 NOTE — ED Notes (Signed)
[employer] left since patient agreed that she wanted to wait to be seen.

## 2018-07-28 NOTE — ED Triage Notes (Signed)
Pt reports abdominal pain and SOB. Left AMA earlier and was unable to find a ride home.

## 2018-07-28 NOTE — ED Notes (Signed)
Pt requesting snacks and a cab ride home. She states that she's not waiting to been seen anymore.

## 2018-07-29 ENCOUNTER — Encounter (HOSPITAL_COMMUNITY): Payer: Self-pay | Admitting: Emergency Medicine

## 2018-07-29 ENCOUNTER — Other Ambulatory Visit: Payer: Self-pay

## 2018-07-29 ENCOUNTER — Emergency Department (HOSPITAL_COMMUNITY)
Admission: EM | Admit: 2018-07-29 | Discharge: 2018-07-30 | Disposition: A | Discharge status: Home / Self Care | Payer: Self-pay | Attending: Emergency Medicine | Admitting: Emergency Medicine

## 2018-07-29 DIAGNOSIS — Z765 Malingerer [conscious simulation]: Secondary | ICD-10-CM | POA: Insufficient documentation

## 2018-07-29 DIAGNOSIS — F10929 Alcohol use, unspecified with intoxication, unspecified: Secondary | ICD-10-CM | POA: Insufficient documentation

## 2018-07-29 DIAGNOSIS — Z79899 Other long term (current) drug therapy: Secondary | ICD-10-CM | POA: Insufficient documentation

## 2018-07-29 DIAGNOSIS — F1020 Alcohol dependence, uncomplicated: Secondary | ICD-10-CM | POA: Insufficient documentation

## 2018-07-29 DIAGNOSIS — J449 Chronic obstructive pulmonary disease, unspecified: Secondary | ICD-10-CM | POA: Insufficient documentation

## 2018-07-29 DIAGNOSIS — F17210 Nicotine dependence, cigarettes, uncomplicated: Secondary | ICD-10-CM | POA: Insufficient documentation

## 2018-07-29 MED ORDER — ONDANSETRON 8 MG PO TBDP
8.0000 mg | ORAL_TABLET | Freq: Once | ORAL | Status: AC
  Administered 2018-07-29: 8 mg via ORAL
  Filled 2018-07-29: qty 1

## 2018-07-29 MED ORDER — PANTOPRAZOLE SODIUM 20 MG PO TBEC: 40.0000 mg | DELAYED_RELEASE_TABLET | Freq: Every day | ORAL | 0 refills | Status: AC

## 2018-07-29 NOTE — ED Notes (Signed)
Cab came and refused to transport pt. Security and Development worker, international aid situation.

## 2018-07-29 NOTE — Discharge Instructions (Addendum)
Please restart your lasix for your leg edema.

## 2018-07-29 NOTE — ED Provider Notes (Signed)
Emergency Department Provider Note   I have reviewed the triage vital signs and the nursing notes.   HISTORY  Chief Complaint Abdominal Pain   HPI Anne Black is a 58 y.o. female with multiple medical problems who presents to the emergency department for the 50th time in 6 months and the 3rd time in last 10 hours. She has multiple complaints to include: leg swelling, bad reflux (out of protonix), homelesness, fatigue. Of note, patient keeps checking in when police are called to assist in getting her home. She denies CP, SOB, fever, cough or syncope.    No other associated or modifying symptoms.    Past Medical History:  Diagnosis Date  . ADHD (attention deficit hyperactivity disorder)   . Alcoholic cirrhosis (HCC)    with ascites  . Alcoholism (HCC)   . Allergies   . Anxiety   . Arthritis   . Atrial fibrillation with RVR (HCC)   . Bipolar disorder (HCC)   . COPD (chronic obstructive pulmonary disease) (HCC)    wears 2L chronic O2  . Dyspnea   . EtOH dependence (HCC) 05/14/2018  . GERD (gastroesophageal reflux disease)   . History of hiatal hernia   . Hypokalemia   . Migraine   . Multiple sclerosis (HCC)   . Narcolepsy   . Osteoarthritis of knee   . Osteoporosis   . Pneumonia     Patient Active Problem List   Diagnosis Date Noted  . Cellulitis of lower extremity   . Peripheral edema   . Atrial fibrillation with RVR (HCC) 06/13/2018  . Alcohol dependence (HCC) 05/06/2018  . Electrolyte disturbance 05/05/2018  . Alcohol withdrawal delirium (HCC) 04/28/2018  . Weakness generalized 04/27/2018  . Gait abnormality 04/24/2018  . Chronic hypoxemic respiratory failure (HCC) 04/11/2018  . Atrial fibrillation with rapid ventricular response (HCC) 03/23/2018  . Head injury   . Fall 03/19/2018  . Syncope 03/19/2018  . Orbital fracture 03/19/2018  . Scalp laceration 03/19/2018  . Acute respiratory failure with hypoxia and hypercarbia (HCC) 03/19/2018  . Abdominal  tenderness 03/19/2018  . Acute on chronic respiratory failure with hypoxia (HCC) 03/17/2018  . Alcohol abuse with intoxication (HCC) 03/17/2018  . ETOH abuse   . Normocytic anemia 03/16/2018  . Acute metabolic encephalopathy 03/16/2018  . Lower extremity edema 03/16/2018  . Migraine 03/16/2018  . Community acquired pneumonia of left lower lobe of lung (HCC)   . Weakness 08/21/2017  . COPD exacerbation (HCC) 06/12/2017  . Chest pain with moderate risk for cardiac etiology 05/30/2017  . Abnormal LFTs 05/30/2017  . Tachycardia 05/30/2017  . COPD (chronic obstructive pulmonary disease) (HCC) 04/19/2017  . Right rib fracture 04/15/2017  . Abdominal wall cellulitis 12/30/2016  . S/P exploratory laparotomy 12/30/2016  . Distal radius fracture, left 09/09/2016  . Thrombocytopenia (HCC) 09/09/2016  . Chest x-ray abnormality   . Umbilical hernia without obstruction and without gangrene 05/12/2016  . Itching 02/24/2016  . Ventral hernia without obstruction or gangrene 02/24/2016  . Palliative care encounter   . Ascites   . Tachypnea   . Atrial flutter with rapid ventricular response (HCC)   . Hypomagnesemia   . Hypoxia   . Dehydration 01/22/2016  . Low back ache 12/30/2015  . Non-intractable vomiting with nausea   . Alcohol abuse with alcohol-induced mood disorder (HCC) 12/15/2015  . Acute alcoholism (HCC)   . Distended abdomen 11/04/2015  . Alcohol abuse   . Dyspnea   . Acute respiratory failure (HCC) 10/15/2015  .  Ascites due to alcoholic cirrhosis ([place]) 16/11/9602  . Osteoporosis   . Multiple sclerosis ([place])   . Alcoholic cirrhosis of liver with ascites ([place]) 08/22/2015  . Bipolar disorder, current episode mixed, moderate ([place]) 08/01/2015  . Alcohol use disorder, severe, dependence ([place]) 07/31/2015  . Macrocytic anemia- due to alcohol abuse with normal B12 & folate levels 05/31/2015  . Severe protein-calorie malnutrition ([place]) 05/31/2015  . Hypokalemia 05/26/2015  .  Encephalopathy, hepatic ([place]) 05/26/2015  . Alcohol withdrawal ([place]) 05/18/2015  . Abdominal pain 05/18/2015  . Anxiety 05/18/2015  . Stimulant abuse ([place]) 12/27/2014  . Nicotine abuse 12/27/2014  . Hyperprolactinemia ([place]) 11/15/2014  . Dyslipidemia   . Alcohol use disorder, moderate, dependence ([place]) 10/30/2014  . Tobacco abuse 01/23/2014  . Bipolar 1 disorder ([place]) 04/07/2013  . Noncompliance with therapeutic plan 04/04/2013  . Hereditary and idiopathic peripheral neuropathy 05/01/2012  . Depression 05/01/2012  . GERD (gastroesophageal reflux disease) 05/01/2012  . Osteoarthrosis, unspecified whether generalized or localized, involving lower leg 05/01/2012  . Hyponatremia 07/01/2011    Past Surgical History:  Procedure Laterality Date  . ABDOMINAL WALL DEFECT REPAIR N/A 12/30/2016   Procedure: EXPLORATORY LAPAROTOMY WITH REPAIR ABDOMINAL WALL VENTRAL HERNIA;  Surgeon: Rolm Bookbinder, MD;  Location: [facility];  Service: General;  Laterality: N/A;  . APPLICATION OF WOUND VAC N/A 12/30/2016   Procedure: APPLICATION OF WOUND VAC;  Surgeon: Rolm Bookbinder, MD;  Location: [facility];  Service: General;  Laterality: N/A;  . CESAREAN SECTION  [phone]  . FRACTURE SURGERY    . HERNIA REPAIR    . IR PARACENTESIS  06/14/2018  . MYRINGOTOMY WITH TUBE PLACEMENT Bilateral   . ORIF WRIST FRACTURE Left 09/09/2016   Procedure: OPEN REDUCTION INTERNAL FIXATION (ORIF) LEFT WRIST FRACTURE, LEFT CARPAL TUNNEL RELEASE;  Surgeon: Roseanne Kaufman, MD;  Location: [facility];  Service: Orthopedics;  Laterality: Left;  . TONSILLECTOMY      Current Outpatient Rx  . Order #: [id number] Class: No Print  . Order #: [id number] Class: Normal  . Order #: [id number] Class: Print  . Order #: [id number] Class: Print  . Order #: [id number] Class: Print  . Order #: [id number] Class: Normal  . Order #: [id number] Class: Historical Med  . Order #: [id number] Class: Print  . Order #: [id number] Class: Historical Med  . Order #:  [id number] Class: Print  . Order #: [id number] Class: Print  . Order #: [id number] Class: Print    Allergies Patient has no known allergies.  Family History  Problem Relation Age of [place]  . Arrhythmia Mother   . Neuropathy Mother   . Coronary artery disease Mother   . Heart disease Mother   . Heart disease Father   . Hypertension Father   . Heart attack Father   . Multiple sclerosis Maternal Aunt     Social History Social History   Tobacco Use  . Smoking status: Current Every Day Smoker    Packs/day: 1.00    Years: 36.00    Pack years: 36.00    Types: Cigarettes  . Smokeless tobacco: Never Used  Substance Use Topics  . Alcohol use: Yes    Comment: Chronic ETOH abuse  . Drug use: No    Review of Systems  All other systems negative except as documented in the HPI. All pertinent positives and negatives as reviewed in the HPI. ____________________________________________   PHYSICAL EXAM:  VITAL SIGNS: ED Triage Vitals  Enc Vitals Group     BP 07/28/18 2235 (!) 108/58     Pulse Rate 07/28/18 2235 81  Resp 07/28/18 2235 18     Temp 07/28/18 2235 97.7 F (36.5 C)     Temp Source 07/28/18 2235 Oral     SpO2 07/28/18 2235 98 %     Weight --      Height --      Head Circumference --      Peak Flow --      Pain Score 07/28/18 2236 10     Pain Loc --      Pain Edu? --      Excl. in GC? --     Constitutional: Alert and oriented. Well appearing and in no acute distress. Eyes: Conjunctivae are normal. PERRL. EOMI. Head: Atraumatic. Nose: No congestion/rhinnorhea. Mouth/Throat: Mucous membranes are moist.  Oropharynx non-erythematous. Neck: No stridor.  No meningeal signs.   Cardiovascular: Normal rate, regular rhythm. Good peripheral circulation. Grossly normal heart sounds.   Respiratory: Normal respiratory effort.  No retractions. Lungs CTAB. Gastrointestinal: Soft and nontender. No distention.  Musculoskeletal: No lower extremity tenderness but has 2+  pitting edema. No gross deformities of extremities. Neurologic:  Normal speech and language. No gross focal neurologic deficits are appreciated.  Skin:  Skin is warm, dry and intact. No rash noted.   ____________________________________________   LABS (all labs ordered are listed, but only abnormal results are displayed)  Labs Reviewed - No data to display ____________________________________________  EKG   EKG Interpretation  Date/Time:  Saturday July 29 2018 00:42:59 EDT Ventricular Rate:  81 PR Interval:    QRS Duration: 100 QT Interval:  426 QTC Calculation: 495 R Axis:   80 Text Interpretation:  Sinus rhythm Low voltage, precordial leads Nonspecific T abnormalities, lateral leads Borderline prolonged QT interval Confirmed by Marily MemosMesner, Aura Bibby [phone]([id number]) on 07/30/2018 10:40:28 AM       ____________________________________________  RADIOLOGY  No results found.  ____________________________________________   PROCEDURES  Procedure(s) performed:   Procedures   ____________________________________________   INITIAL IMPRESSION / ASSESSMENT AND PLAN / ED COURSE  Here with complaints of reflux, filled protonix. Symptoms not c/w ACS and unchanged ECG.  Also has LE edema, not taking lasix as prescribed, will resume.  Suspect malingering; discussed with daughter and will consult sw/cm to see if there is any way to reduce these visits.   Pertinent labs & imaging results that were available during my care of the patient were reviewed by me and considered in my medical decision making (see chart for details).   A medical screening exam was performed and I feel the patient has had an appropriate workup for their chief complaint at this time and likelihood of emergent condition existing is low. They have been counseled on decision, discharge, follow up and which symptoms necessitate immediate return to the emergency department. They or their family verbally stated understanding  and agreement with plan and discharged in stable condition.   ____________________________________________  FINAL CLINICAL IMPRESSION(S) / ED DIAGNOSES  Final diagnoses:  Epigastric pain  Leg edema     MEDICATIONS GIVEN DURING THIS VISIT:  Medications - No data to display   NEW OUTPATIENT MEDICATIONS STARTED DURING THIS VISIT:  Discharge Medication List as of 07/29/2018 12:39 AM      Note:  This note was prepared with assistance of Dragon voice recognition software. Occasional wrong-word or sound-a-like substitutions may have occurred due to the inherent limitations of voice recognition software.   Marily MemosMesner, Ivanna Kocak, MD 07/30/18 [phone]

## 2018-07-29 NOTE — ED Notes (Signed)
Pt daughter came and picked her up. She refused to sign at d/c.

## 2018-07-29 NOTE — ED Provider Notes (Signed)
[facility]-EMERGENCY DEPT Provider Note   CSN: [id number] Arrival date & time: 07/29/18  2247     History   Chief Complaint Chief Complaint  Patient presents with  . Nausea  . Alcohol Intoxication    HPI Anne ApplebaumCarla S Black is a 58 y.o. female.     Patient presents to the emergency department for evaluation of nausea.  She comes from home by EMS. This is her 4th visit in the last 24 hours.     Past Medical History:  Diagnosis Date  . ADHD (attention deficit hyperactivity disorder)   . Alcoholic cirrhosis (HCC)    with ascites  . Alcoholism (HCC)   . Allergies   . Anxiety   . Arthritis   . Atrial fibrillation with RVR (HCC)   . Bipolar disorder (HCC)   . COPD (chronic obstructive pulmonary disease) (HCC)    wears 2L chronic O2  . Dyspnea   . EtOH dependence (HCC) 05/14/2018  . GERD (gastroesophageal reflux disease)   . History of hiatal hernia   . Hypokalemia   . Migraine   . Multiple sclerosis (HCC)   . Narcolepsy   . Osteoarthritis of knee   . Osteoporosis   . Pneumonia     Patient Active Problem List   Diagnosis Date Noted  . Cellulitis of lower extremity   . Peripheral edema   . Atrial fibrillation with RVR (HCC) 06/13/2018  . Alcohol dependence (HCC) 05/06/2018  . Electrolyte disturbance 05/05/2018  . Alcohol withdrawal delirium (HCC) 04/28/2018  . Weakness generalized 04/27/2018  . Gait abnormality 04/24/2018  . Chronic hypoxemic respiratory failure (HCC) 04/11/2018  . Atrial fibrillation with rapid ventricular response (HCC) 03/23/2018  . Head injury   . Fall 03/19/2018  . Syncope 03/19/2018  . Orbital fracture 03/19/2018  . Scalp laceration 03/19/2018  . Acute respiratory failure with hypoxia and hypercarbia (HCC) 03/19/2018  . Abdominal tenderness 03/19/2018  . Acute on chronic respiratory failure with hypoxia (HCC) 03/17/2018  . Alcohol abuse with intoxication (HCC) 03/17/2018  . ETOH abuse   . Normocytic anemia  03/16/2018  . Acute metabolic encephalopathy 03/16/2018  . Lower extremity edema 03/16/2018  . Migraine 03/16/2018  . Community acquired pneumonia of left lower lobe of lung (HCC)   . Weakness 08/21/2017  . COPD exacerbation (HCC) 06/12/2017  . Chest pain with moderate risk for cardiac etiology 05/30/2017  . Abnormal LFTs 05/30/2017  . Tachycardia 05/30/2017  . COPD (chronic obstructive pulmonary disease) (HCC) 04/19/2017  . Right rib fracture 04/15/2017  . Abdominal wall cellulitis 12/30/2016  . S/P exploratory laparotomy 12/30/2016  . Distal radius fracture, left 09/09/2016  . Thrombocytopenia (HCC) 09/09/2016  . Chest x-ray abnormality   . Umbilical hernia without obstruction and without gangrene 05/12/2016  . Itching 02/24/2016  . Ventral hernia without obstruction or gangrene 02/24/2016  . Palliative care encounter   . Ascites   . Tachypnea   . Atrial flutter with rapid ventricular response (HCC)   . Hypomagnesemia   . Hypoxia   . Dehydration 01/22/2016  . Low back ache 12/30/2015  . Non-intractable vomiting with nausea   . Alcohol abuse with alcohol-induced mood disorder (HCC) 12/15/2015  . Acute alcoholism (HCC)   . Distended abdomen 11/04/2015  . Alcohol abuse   . Dyspnea   . Acute respiratory failure (HCC) 10/15/2015  . Ascites due to alcoholic cirrhosis (HCC) 10/15/2015  . Osteoporosis   . Multiple sclerosis (HCC)   . Alcoholic cirrhosis of liver with ascites (  HCC) 08/22/2015  . Bipolar disorder, current episode mixed, moderate (HCC) 08/01/2015  . Alcohol use disorder, severe, dependence (HCC) 07/31/2015  . Macrocytic anemia- due to alcohol abuse with normal B12 & folate levels 05/31/2015  . Severe protein-calorie malnutrition (HCC) 05/31/2015  . Hypokalemia 05/26/2015  . Encephalopathy, hepatic (HCC) 05/26/2015  . Alcohol withdrawal (HCC) 05/18/2015  . Abdominal pain 05/18/2015  . Anxiety 05/18/2015  . Stimulant abuse (HCC) 12/27/2014  . Nicotine abuse  12/27/2014  . Hyperprolactinemia (HCC) 11/15/2014  . Dyslipidemia   . Alcohol use disorder, moderate, dependence (HCC) 10/30/2014  . Tobacco abuse 01/23/2014  . Bipolar 1 disorder (HCC) 04/07/2013  . Noncompliance with therapeutic plan 04/04/2013  . Hereditary and idiopathic peripheral neuropathy 05/01/2012  . Depression 05/01/2012  . GERD (gastroesophageal reflux disease) 05/01/2012  . Osteoarthrosis, unspecified whether generalized or localized, involving lower leg 05/01/2012  . Hyponatremia 07/01/2011    Past Surgical History:  Procedure Laterality Date  . ABDOMINAL WALL DEFECT REPAIR N/A 12/30/2016   Procedure: EXPLORATORY LAPAROTOMY WITH REPAIR ABDOMINAL WALL VENTRAL HERNIA;  Surgeon: Emelia LoronWakefield, Matthew, MD;  [facility];  Service: General;  Laterality: N/A;  . APPLICATION OF WOUND VAC N/A 12/30/2016   Procedure: APPLICATION OF WOUND VAC;  Surgeon: Emelia LoronWakefield, Matthew, MD;  [facility];  Service: General;  Laterality: N/A;  . CESAREAN SECTION  [phone]  . FRACTURE SURGERY    . HERNIA REPAIR    . IR PARACENTESIS  06/14/2018  . MYRINGOTOMY WITH TUBE PLACEMENT Bilateral   . ORIF WRIST FRACTURE Left 09/09/2016   Procedure: OPEN REDUCTION INTERNAL FIXATION (ORIF) LEFT WRIST FRACTURE, LEFT CARPAL TUNNEL RELEASE;  Surgeon: Dominica SeverinGramig, William, MD;  Location: [facility] ORS;  Service: Orthopedics;  Laterality: Left;  . TONSILLECTOMY       OB History   No obstetric history on file.      Home Medications    Prior to Admission medications   Medication Sig Start Date End Date Taking? Authorizing Provider  acetaminophen (TYLENOL) 500 MG tablet Take 1 tablet (500 mg total) by mouth every 6 (six) hours as needed for mild pain. Patient taking differently: Take 1,000 mg by mouth every 6 (six) hours as needed for mild pain.  03/28/18   Rolly SalterPatel, Pranav M, MD  diltiazem (CARDIZEM CD) 120 MG 24 hr capsule Take 1 capsule (120 mg total) by mouth daily for 30 days. 04/14/18 07/01/18  Tegeler, Canary Brimhristopher J,  MD  famotidine (PEPCID) 20 MG tablet Take 1 tablet (20 mg total) by mouth daily for 14 days. 07/24/18 08/07/18  Caccavale, Sophia, PA-C  furosemide (LASIX) 40 MG tablet Take 1 tablet (40 mg total) by mouth daily. 06/27/18   Sabas SousBero, Michael M, MD  gabapentin (NEURONTIN) 100 MG capsule Take 2 capsules (200 mg total) by mouth 3 (three) times daily for 30 days. 07/19/18 08/18/18  Fayrene Helperran, Bowie, PA-C  hydrOXYzine (ATARAX/VISTARIL) 25 MG tablet Take 1 tablet (25 mg total) by mouth daily as needed for up to 30 doses (anxiety). Patient not taking: Reported on 07/24/2018 04/14/18   Tegeler, Canary Brimhristopher J, MD  magnesium oxide (MAG-OX) 400 MG tablet Take 400 mg by mouth daily.    [provider]  methocarbamol (ROBAXIN) 500 MG tablet Take 1 tablet (500 mg total) by mouth 2 (two) times daily as needed for muscle spasms. 06/27/18   Sabas SousBero, Michael M, MD  ondansetron (ZOFRAN) 4 MG tablet Take 4 mg by mouth every 8 (eight) hours as needed for nausea or vomiting.    [provider]  pantoprazole (PROTONIX) 20 MG tablet Take 2 tablets (40 mg total) by mouth daily. 07/29/18   Mesner, Barbara Cower, MD  potassium chloride SA (K-DUR,KLOR-CON) 20 MEQ tablet Take 2 tablets (40 mEq total) by mouth 2 (two) times daily. 04/19/18   Bethel Born, PA-C  spironolactone (ALDACTONE) 25 MG tablet Take 1 tablet (25 mg total) by mouth daily for 30 days. 06/27/18 07/27/18  Sabas Sous, MD    Family History Family History  Problem Relation Age of Onset  . Arrhythmia Mother   . Neuropathy Mother   . Coronary artery disease Mother   . Heart disease Mother   . Heart disease Father   . Hypertension Father   . Heart attack Father   . Multiple sclerosis Maternal Aunt     Social History Social History   Tobacco Use  . Smoking status: Current Every Day Smoker    Packs/day: 1.00    Years: 36.00    Pack years: 36.00    Types: Cigarettes  . Smokeless tobacco: Never Used  Substance Use Topics  . Alcohol use: Yes    Comment:  Chronic ETOH abuse  . Drug use: No     Allergies   Patient has no known allergies.   Review of Systems Review of Systems  Gastrointestinal: Positive for nausea.  All other systems reviewed and are negative.    Physical Exam Updated Vital Signs BP (!) 109/53 (BP Location: Right Arm)   Pulse 87   Temp 97.9 F (36.6 C) (Oral)   Resp 18   Ht 5\' 4"  (1.626 m)   Wt 82.6 kg   SpO2 100% Comment: 2L  BMI 31.26 kg/m   Physical Exam Vitals signs and nursing note reviewed.  Constitutional:      General: She is not in acute distress.    Appearance: Normal appearance. She is well-developed.  HENT:     Head: Normocephalic and atraumatic.     Right Ear: Hearing normal.     Left Ear: Hearing normal.     Nose: Nose normal.  Eyes:     Conjunctiva/sclera: Conjunctivae normal.     Pupils: Pupils are equal, round, and reactive to light.  Neck:     Musculoskeletal: Normal range of motion and neck supple.  Cardiovascular:     Rate and Rhythm: Regular rhythm.     Heart sounds: S1 normal and S2 normal. No murmur. No friction rub. No gallop.   Pulmonary:     Effort: Pulmonary effort is normal. No respiratory distress.     Breath sounds: Normal breath sounds.  Chest:     Chest wall: No tenderness.  Abdominal:     General: Bowel sounds are normal.     Palpations: Abdomen is soft.     Tenderness: There is no abdominal tenderness. There is no guarding or rebound. Negative signs include Murphy's sign and McBurney's sign.     Hernia: No hernia is present.  Musculoskeletal: Normal range of motion.  Skin:    General: Skin is warm and dry.     Findings: No rash.  Neurological:     Mental Status: She is alert and oriented to person, place, and time.     GCS: GCS eye subscore is 4. GCS verbal subscore is 5. GCS motor subscore is 6.     Cranial Nerves: No cranial nerve deficit.     Sensory: No sensory deficit.     Coordination: Coordination normal.  Psychiatric:        Speech:  Speech  normal.        Behavior: Behavior normal.        Thought Content: Thought content normal.      ED Treatments / Results  Labs (all labs ordered are listed, but only abnormal results are displayed) Labs Reviewed - No data to display  EKG    Radiology No results found.  Procedures Procedures (including critical care time)  Medications Ordered in ED Medications  ondansetron (ZOFRAN-ODT) disintegrating tablet 8 mg (has no administration in time range)     Initial Impression / Assessment and Plan / ED Course  I have reviewed the triage vital signs and the nursing notes.  Pertinent labs & imaging results that were available during my care of the patient were reviewed by me and considered in my medical decision making (see chart for details).        Patient arrives to the emergency department by ambulance with complaints of nausea.  This is her fourth visit in 24 hours.  She has greater than 50 visits in 6 months.  She also has a history of EMS abuse with over 200 calls to EMS.  Patient does have history of chronic alcoholism.  She does not appear to be significantly intoxicated at this time but also has no symptoms of withdrawal.  Examination is otherwise unremarkable.  I do not believe she requires yet another work-up at this time.  Will treat nausea with Zofran, patient will be appropriate for discharge.  Final Clinical Impressions(s) / ED Diagnoses   Final diagnoses:  [place]    ED Discharge Orders    None       Orpah Greek, MD 07/29/18 [id number]

## 2018-07-29 NOTE — ED Triage Notes (Signed)
[facility] transferred pt from home to [facility] ED for the third time today and reports the following:  Pt c/o nausea, no vomiting, withdrawal symptoms, chest tight from anxiety.  90% O2 on room air. 100% O2 on 2L.  Family has tried IVC pt but she has been assessed as competent. Family trying to have her house condemned.

## 2018-07-29 NOTE — ED Notes (Signed)
Bed: QD64 Expected date:  Expected time:  Means of arrival:  Comments: EMS 58 yo female alcohol withdrawal

## 2018-07-30 ENCOUNTER — Emergency Department (HOSPITAL_COMMUNITY): Admission: EM | Admit: 2018-07-30 | Discharge: 2018-07-30 | Discharge status: Absent without leave | Payer: Self-pay

## 2018-08-01 ENCOUNTER — Ambulatory Visit: Payer: Self-pay | Attending: Primary Care | Admitting: Primary Care

## 2018-08-01 ENCOUNTER — Other Ambulatory Visit: Payer: Self-pay

## 2018-08-03 IMAGING — US US ABDOMEN COMPLETE
1 series · 13 of 25 positions shown · non-contrast
Comparison: CT abdomen and pelvis 10/15/2015

CLINICAL DATA: Cirrhosis. History of alcohol abuse. History of MS.

EXAM:
ABDOMEN ULTRASOUND COMPLETE

[Series 1: us abdomen complete · 0.23mm/px · 13 of 81 slices shown]
[im 1/81]
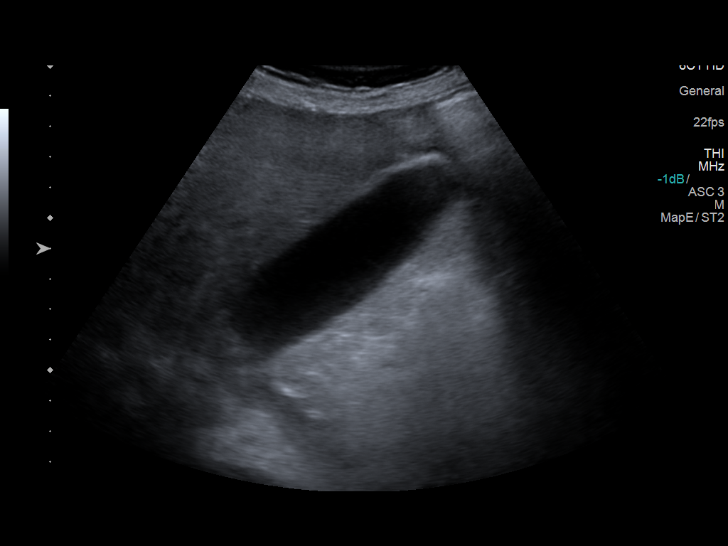
[im 7/81]
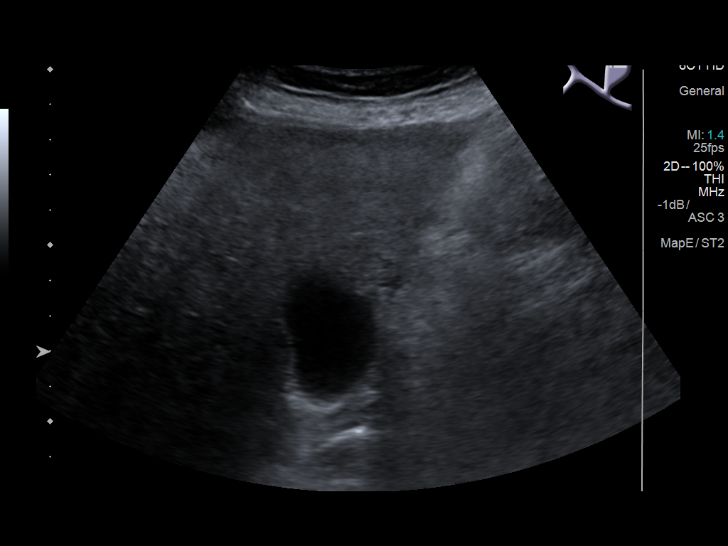
[im 14/81]
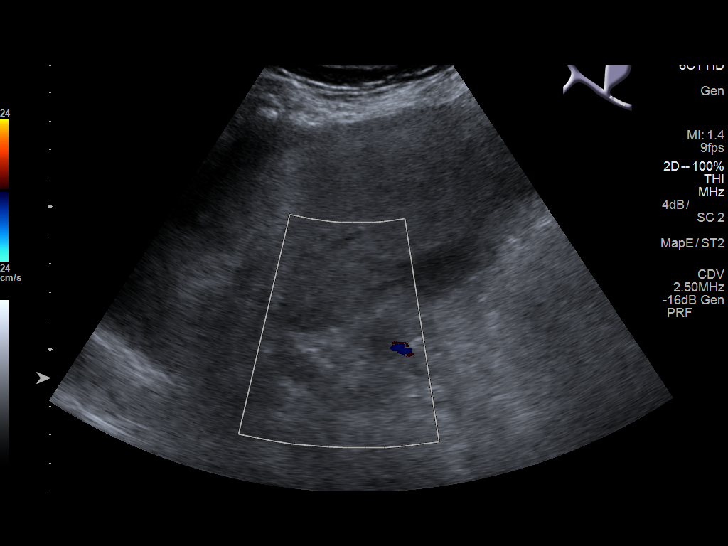
[im 21/81]
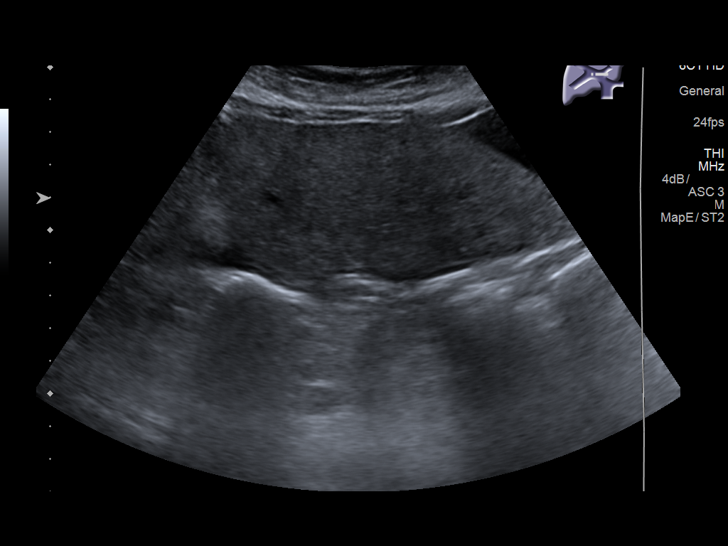
[im 27/81]
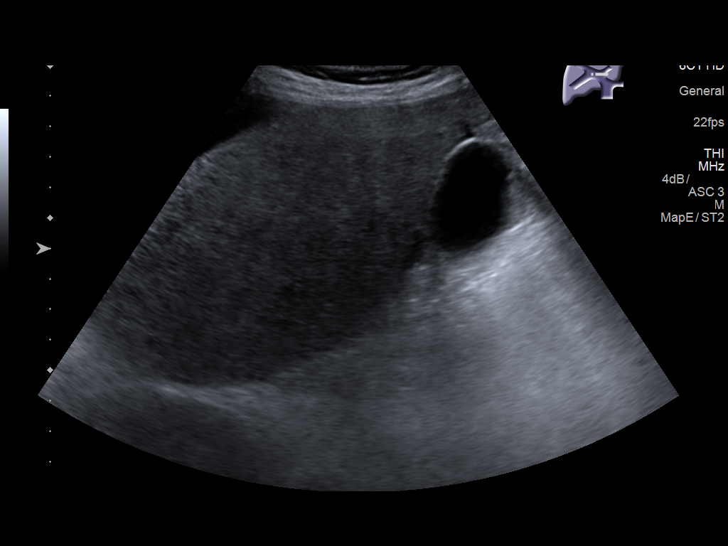
[im 34/81]
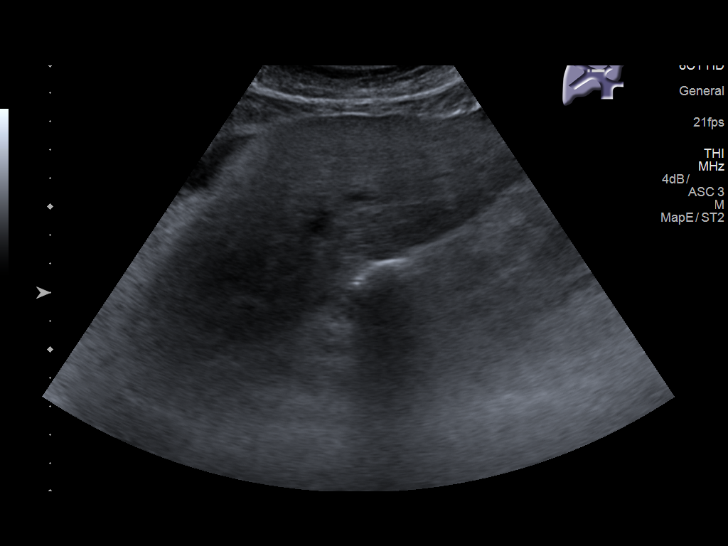
[im 41/81]
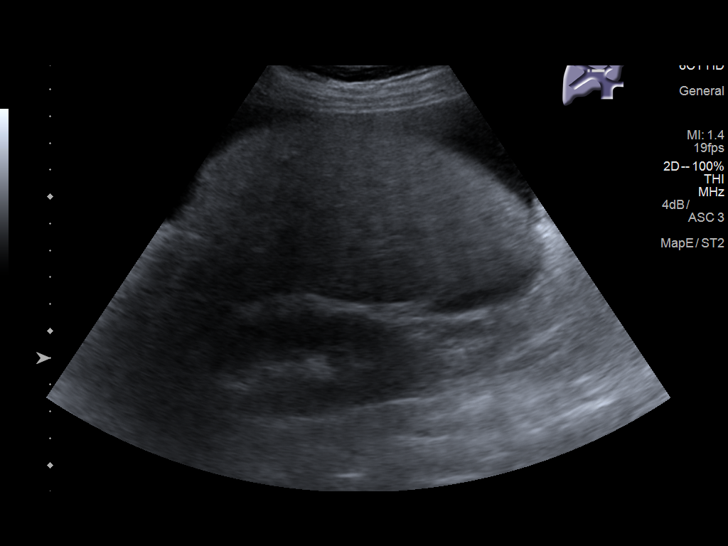
[im 47/81]
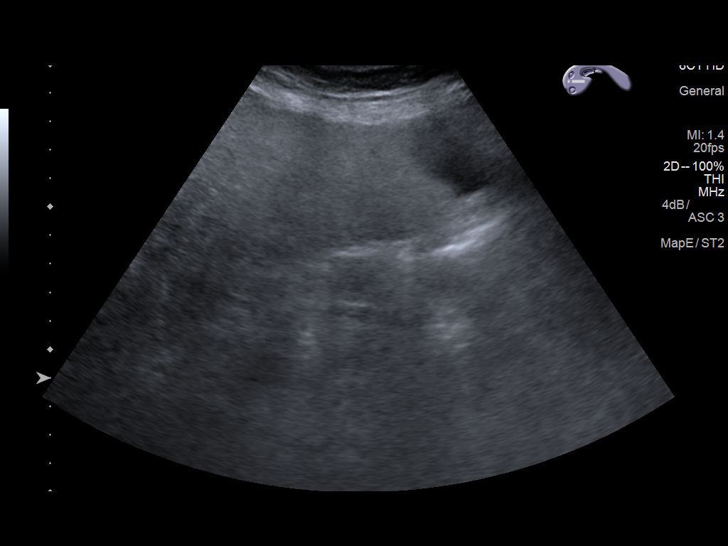
[im 54/81]
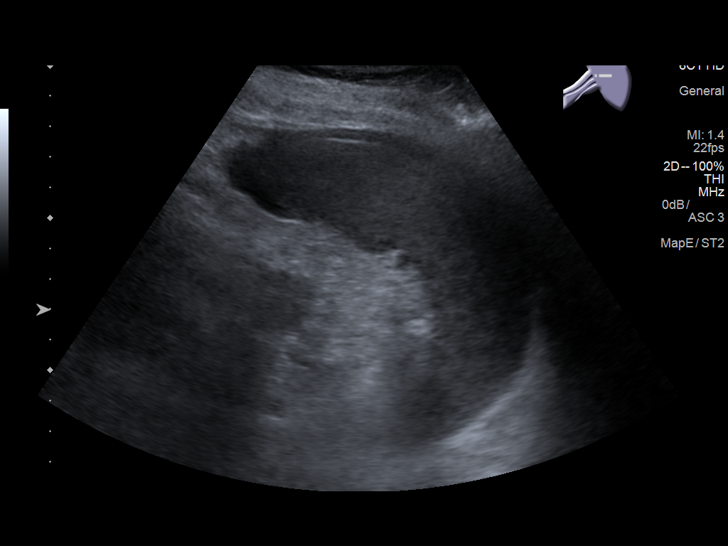
[im 61/81]
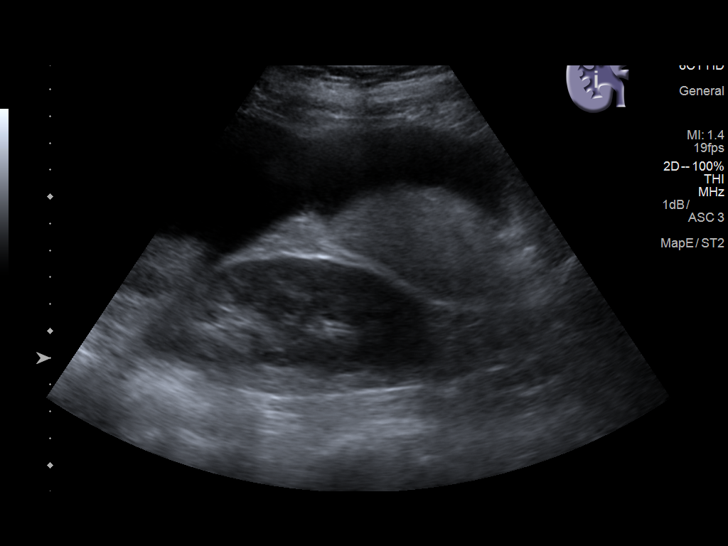
[im 67/81]
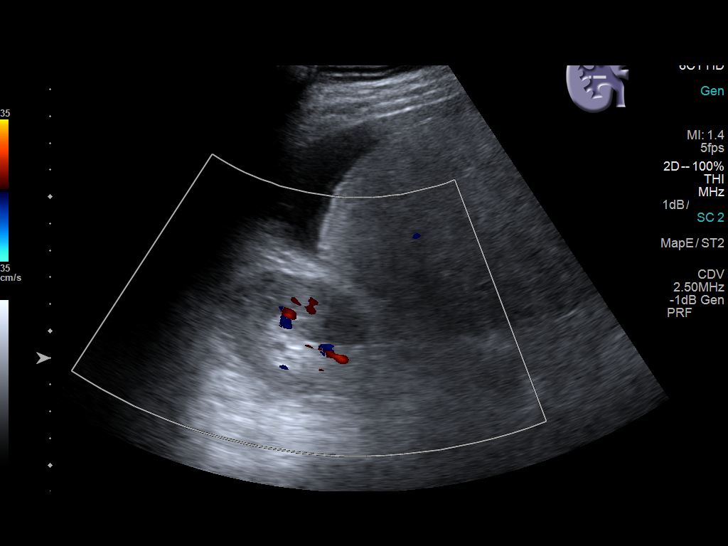
[im 74/81]
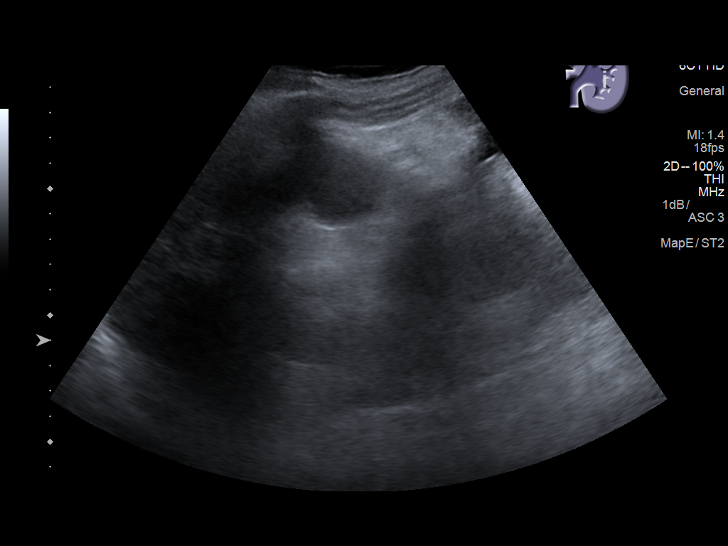
[im 81/81]
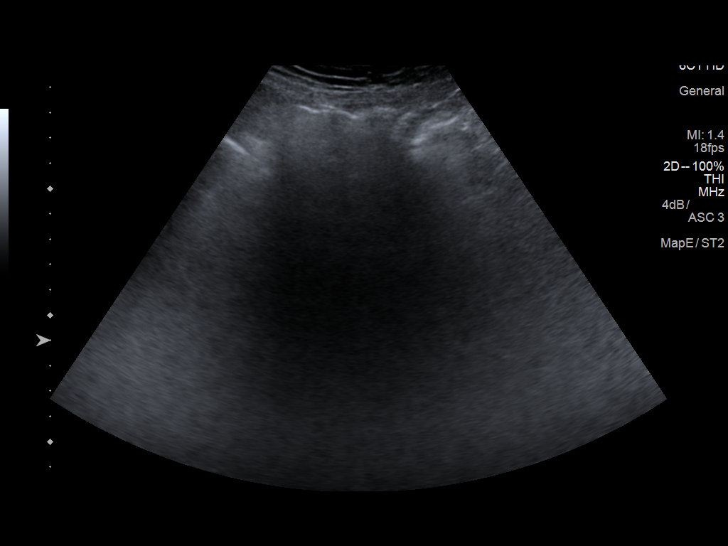

[13 of 25 positions shown; findings below may reference images not displayed]

FINDINGS: Gallbladder: No gallstones or wall thickening visualized. No
sonographic Murphy sign noted by sonographer.

Common bile duct: Diameter: 2.4 mm, normal

Liver: Diffusely increased and coarsened hepatic parenchymal
echotexture with nodular contour to the liver. Changes are
consistent with fatty infiltration and cirrhosis. No focal liver
lesions are identified.

IVC: Not visualized due to overlying bowel gas.

Pancreas: Not visualized due to overlying bowel gas.

Spleen: Size and appearance within normal limits.

Right Kidney: Length: 10.9 cm. Echogenicity within normal limits. No
mass or hydronephrosis visualized.

Left Kidney: Length: 11.5 cm. Echogenicity within normal limits. No
mass or hydronephrosis visualized.

Abdominal aorta: Not visualized due to overlying bowel gas.

Other findings: Moderate diffuse abdominal ascites is present.
IMPRESSION: Hepatic echotexture changes consistent with fatty infiltration and
hepatic cirrhosis. No focal liver lesions identified. Moderate
diffuse abdominal ascites. No evidence of cholelithiasis or
cholecystitis.

## 2018-08-05 ENCOUNTER — Emergency Department (HOSPITAL_COMMUNITY)
Admission: EM | Admit: 2018-08-05 | Discharge: 2018-08-05 | Disposition: A | Discharge status: Home / Self Care | Payer: Self-pay | Attending: Emergency Medicine | Admitting: Emergency Medicine

## 2018-08-05 ENCOUNTER — Encounter (HOSPITAL_COMMUNITY): Payer: Self-pay

## 2018-08-05 ENCOUNTER — Other Ambulatory Visit: Payer: Self-pay

## 2018-08-05 DIAGNOSIS — Z79899 Other long term (current) drug therapy: Secondary | ICD-10-CM | POA: Insufficient documentation

## 2018-08-05 DIAGNOSIS — F1099 Alcohol use, unspecified with unspecified alcohol-induced disorder: Secondary | ICD-10-CM | POA: Insufficient documentation

## 2018-08-05 DIAGNOSIS — F17210 Nicotine dependence, cigarettes, uncomplicated: Secondary | ICD-10-CM | POA: Insufficient documentation

## 2018-08-05 DIAGNOSIS — G35 Multiple sclerosis: Secondary | ICD-10-CM | POA: Insufficient documentation

## 2018-08-05 DIAGNOSIS — J449 Chronic obstructive pulmonary disease, unspecified: Secondary | ICD-10-CM | POA: Insufficient documentation

## 2018-08-05 DIAGNOSIS — R1084 Generalized abdominal pain: Secondary | ICD-10-CM | POA: Insufficient documentation

## 2018-08-05 DIAGNOSIS — R110 Nausea: Secondary | ICD-10-CM | POA: Insufficient documentation

## 2018-08-05 MED ORDER — ONDANSETRON 8 MG PO TBDP
8.0000 mg | ORAL_TABLET | Freq: Once | ORAL | Status: AC
  Administered 2018-08-05: 8 mg via ORAL
  Filled 2018-08-05: qty 1

## 2018-08-05 MED ORDER — ONDANSETRON HCL 4 MG PO TABS: 4.0000 mg | ORAL_TABLET | Freq: Four times a day (QID) | ORAL | 0 refills | Status: DC

## 2018-08-05 MED ORDER — PANTOPRAZOLE SODIUM 20 MG PO TBEC: 20.0000 mg | DELAYED_RELEASE_TABLET | Freq: Every day | ORAL | 0 refills | Status: DC

## 2018-08-05 NOTE — ED Triage Notes (Signed)
Pt BIB EMS c/o abdominal pain, sob, chest tightness, and nausea. Pt sts she is supposed to use O2 at 2-3L, but she has not been wearing it. Pt admits to drinking alcohol (wine) yesterday. Pt is covered in feces, but refuses to clean herself up. Pt sts she has not defecated in 2 days. 97% on r/a. Denies vomiting or diarrhea.

## 2018-08-05 NOTE — ED Notes (Signed)
PT DISCHARGED. INSTRUCTIONS AND PRESCRIPTIONS GIVEN. AAOX4. PT IN NO APPARENT DISTRESS OR PAIN, BUT C/O NAUSEA. ERA MADE AWARE. THE OPPORTUNITY TO ASK QUESTIONS WAS PROVIDED.

## 2018-08-05 NOTE — Discharge Instructions (Addendum)
Return here as needed. Follow up with your doctor. °

## 2018-08-05 NOTE — ED Notes (Signed)
Bed: JH41 Expected date: 08/05/18 Expected time: 11:07 AM Means of arrival: Ambulance Comments: Etoh

## 2018-08-05 NOTE — ED Provider Notes (Signed)
[facility]-EMERGENCY DEPT Provider Note   CSN: [id number] Arrival date & time: 08/05/18  1108     History   Chief Complaint Chief Complaint  Patient presents with  . Abdominal Pain  . Alcohol Problem  . Nausea    HPI Anne Black is a 58 y.o. female.     HPI Patient presents to the emergency department with generalized pain that is chronic for the patient and nausea.  The patient states that she has no other complaints.  Patient states she is nauseated.  She states that she does not have any abdominal pain that is new.  Patient states she would like something to eat.  Patient has no other complaints.  The patient denies chest pain, shortness of breath, headache,blurred vision, neck pain, fever, cough, weakness, numbness, dizziness, anorexia, edema, vomiting, diarrhea, rash, back pain, dysuria, hematemesis, bloody stool, near syncope, or syncope. Past Medical History:  Diagnosis Date  . ADHD (attention deficit hyperactivity disorder)   . Alcoholic cirrhosis (HCC)    with ascites  . Alcoholism (HCC)   . Allergies   . Anxiety   . Arthritis   . Atrial fibrillation with RVR (HCC)   . Bipolar disorder (HCC)   . COPD (chronic obstructive pulmonary disease) (HCC)    wears 2L chronic O2  . Dyspnea   . EtOH dependence (HCC) 05/14/2018  . GERD (gastroesophageal reflux disease)   . History of hiatal hernia   . Hypokalemia   . Migraine   . Multiple sclerosis (HCC)   . Narcolepsy   . Osteoarthritis of knee   . Osteoporosis   . Pneumonia     Patient Active Problem List   Diagnosis Date Noted  . Cellulitis of lower extremity   . Peripheral edema   . Atrial fibrillation with RVR (HCC) 06/13/2018  . Alcohol dependence (HCC) 05/06/2018  . Electrolyte disturbance 05/05/2018  . Alcohol withdrawal delirium (HCC) 04/28/2018  . Weakness generalized 04/27/2018  . Gait abnormality 04/24/2018  . Chronic hypoxemic respiratory failure (HCC) 04/11/2018  . Atrial  fibrillation with rapid ventricular response (HCC) 03/23/2018  . Head injury   . Fall 03/19/2018  . Syncope 03/19/2018  . Orbital fracture 03/19/2018  . Scalp laceration 03/19/2018  . Acute respiratory failure with hypoxia and hypercarbia (HCC) 03/19/2018  . Abdominal tenderness 03/19/2018  . Acute on chronic respiratory failure with hypoxia (HCC) 03/17/2018  . Alcohol abuse with intoxication (HCC) 03/17/2018  . ETOH abuse   . Normocytic anemia 03/16/2018  . Acute metabolic encephalopathy 03/16/2018  . Lower extremity edema 03/16/2018  . Migraine 03/16/2018  . Community acquired pneumonia of left lower lobe of lung (HCC)   . Weakness 08/21/2017  . COPD exacerbation (HCC) 06/12/2017  . Chest pain with moderate risk for cardiac etiology 05/30/2017  . Abnormal LFTs 05/30/2017  . Tachycardia 05/30/2017  . COPD (chronic obstructive pulmonary disease) (HCC) 04/19/2017  . Right rib fracture 04/15/2017  . Abdominal wall cellulitis 12/30/2016  . S/P exploratory laparotomy 12/30/2016  . Distal radius fracture, left 09/09/2016  . Thrombocytopenia (HCC) 09/09/2016  . Chest x-ray abnormality   . Umbilical hernia without obstruction and without gangrene 05/12/2016  . Itching 02/24/2016  . Ventral hernia without obstruction or gangrene 02/24/2016  . Palliative care encounter   . Ascites   . Tachypnea   . Atrial flutter with rapid ventricular response (HCC)   . Hypomagnesemia   . Hypoxia   . Dehydration 01/22/2016  . Low back ache 12/30/2015  .  Non-intractable vomiting with nausea   . Alcohol abuse with alcohol-induced mood disorder ([place]) 12/15/2015  . Acute alcoholism ([place])   . Distended abdomen 11/04/2015  . Alcohol abuse   . Dyspnea   . Acute respiratory failure ([place]) 10/15/2015  . Ascites due to alcoholic cirrhosis ([place]) 33/29/5188  . Osteoporosis   . Multiple sclerosis ([place])   . Alcoholic cirrhosis of liver with ascites ([place]) 08/22/2015  . Bipolar disorder, current episode  mixed, moderate ([place]) 08/01/2015  . Alcohol use disorder, severe, dependence ([place]) 07/31/2015  . Macrocytic anemia- due to alcohol abuse with normal B12 & folate levels 05/31/2015  . Severe protein-calorie malnutrition ([place]) 05/31/2015  . Hypokalemia 05/26/2015  . Encephalopathy, hepatic ([place]) 05/26/2015  . Alcohol withdrawal ([place]) 05/18/2015  . Abdominal pain 05/18/2015  . Anxiety 05/18/2015  . Stimulant abuse ([place]) 12/27/2014  . Nicotine abuse 12/27/2014  . Hyperprolactinemia ([place]) 11/15/2014  . Dyslipidemia   . Alcohol use disorder, moderate, dependence ([place]) 10/30/2014  . Tobacco abuse 01/23/2014  . Bipolar 1 disorder ([place]) 04/07/2013  . Noncompliance with therapeutic plan 04/04/2013  . Hereditary and idiopathic peripheral neuropathy 05/01/2012  . Depression 05/01/2012  . GERD (gastroesophageal reflux disease) 05/01/2012  . Osteoarthrosis, unspecified whether generalized or localized, involving lower leg 05/01/2012  . Hyponatremia 07/01/2011    Past Surgical History:  Procedure Laterality Date  . ABDOMINAL WALL DEFECT REPAIR N/A 12/30/2016   Procedure: EXPLORATORY LAPAROTOMY WITH REPAIR ABDOMINAL WALL VENTRAL HERNIA;  Surgeon: Rolm Bookbinder, MD;  Location: [facility];  Service: General;  Laterality: N/A;  . APPLICATION OF WOUND VAC N/A 12/30/2016   Procedure: APPLICATION OF WOUND VAC;  Surgeon: Rolm Bookbinder, MD;  Location: [facility];  Service: General;  Laterality: N/A;  . CESAREAN SECTION  [phone]  . FRACTURE SURGERY    . HERNIA REPAIR    . IR PARACENTESIS  06/14/2018  . MYRINGOTOMY WITH TUBE PLACEMENT Bilateral   . ORIF WRIST FRACTURE Left 09/09/2016   Procedure: OPEN REDUCTION INTERNAL FIXATION (ORIF) LEFT WRIST FRACTURE, LEFT CARPAL TUNNEL RELEASE;  Surgeon: Roseanne Kaufman, MD;  Location: [facility];  Service: Orthopedics;  Laterality: Left;  . TONSILLECTOMY       OB History   No obstetric history on file.      Home Medications    Prior to Admission medications    Medication Sig Start Date End Date Taking? Authorizing Provider  acetaminophen (TYLENOL) 500 MG tablet Take 1 tablet (500 mg total) by mouth every 6 (six) hours as needed for mild pain. Patient taking differently: Take 1,000 mg by mouth every 6 (six) hours as needed for mild pain.  03/28/18  Yes Lavina Hamman, MD  diltiazem (CARDIZEM CD) 120 MG 24 hr capsule Take 1 capsule (120 mg total) by mouth daily for 30 days. 04/14/18 08/05/18 Yes Tegeler, Gwenyth Allegra, MD  famotidine (PEPCID) 20 MG tablet Take 1 tablet (20 mg total) by mouth daily for 14 days. 07/24/18 08/07/18 Yes Caccavale, Sophia, PA-C  furosemide (LASIX) 40 MG tablet Take 1 tablet (40 mg total) by mouth daily. 06/27/18  Yes Maudie Flakes, MD  gabapentin (NEURONTIN) 100 MG capsule Take 2 capsules (200 mg total) by mouth 3 (three) times daily for 30 days. 07/19/18 08/18/18 Yes Domenic Moras, PA-C  methocarbamol (ROBAXIN) 500 MG tablet Take 1 tablet (500 mg total) by mouth 2 (two) times daily as needed for muscle spasms. 06/27/18  Yes Maudie Flakes, MD  ondansetron (ZOFRAN) 4 MG tablet Take 4 mg by mouth every 8 (  eight) hours as needed for nausea or vomiting.   Yes [provider]  pantoprazole (PROTONIX) 20 MG tablet Take 2 tablets (40 mg total) by mouth daily. 07/29/18  Yes Mesner, Barbara CowerJason, MD  spironolactone (ALDACTONE) 25 MG tablet Take 1 tablet (25 mg total) by mouth daily for 30 days. 06/27/18 08/05/18 Yes BeroElmer Sow, Michael M, MD  hydrOXYzine (ATARAX/VISTARIL) 25 MG tablet Take 1 tablet (25 mg total) by mouth daily as needed for up to 30 doses (anxiety). Patient not taking: Reported on 08/05/2018 04/14/18   Tegeler, Canary Brimhristopher J, MD  potassium chloride SA (K-DUR,KLOR-CON) 20 MEQ tablet Take 2 tablets (40 mEq total) by mouth 2 (two) times daily. Patient not taking: Reported on 08/05/2018 04/19/18   Bethel BornGekas, Kelly Marie, PA-C    Family History Family History  Problem Relation Age of Onset  . Arrhythmia Mother   . Neuropathy Mother   .  Coronary artery disease Mother   . Heart disease Mother   . Heart disease Father   . Hypertension Father   . Heart attack Father   . Multiple sclerosis Maternal Aunt     Social History Social History   Tobacco Use  . Smoking status: Current Every Day Smoker    Packs/day: 1.00    Years: 36.00    Pack years: 36.00    Types: Cigarettes  . Smokeless tobacco: Never Used  Substance Use Topics  . Alcohol use: Yes    Comment: Chronic ETOH abuse  . Drug use: No     Allergies   Patient has no known allergies.   Review of Systems Review of Systems All other systems negative except as documented in the HPI. All pertinent positives and negatives as reviewed in the HPI.  Physical Exam Updated Vital Signs BP 129/64   Pulse 83   Temp 98.2 F (36.8 C) (Oral)   Resp 16   Ht 5\' 2"  (1.575 m)   Wt 86.2 kg   SpO2 95%   BMI 34.75 kg/m   Physical Exam Vitals signs and nursing note reviewed.  Constitutional:      General: She is not in acute distress.    Appearance: She is well-developed.  HENT:     Head: Normocephalic and atraumatic.  Eyes:     Pupils: Pupils are equal, round, and reactive to light.  Neck:     Musculoskeletal: Normal range of motion and neck supple.  Cardiovascular:     Rate and Rhythm: Normal rate and regular rhythm.     Heart sounds: Normal heart sounds. No murmur. No friction rub. No gallop.   Pulmonary:     Effort: Pulmonary effort is normal. No respiratory distress.     Breath sounds: Normal breath sounds. No wheezing.  Abdominal:     General: Bowel sounds are normal. There is no distension.     Palpations: Abdomen is soft.  Skin:    General: Skin is warm and dry.     Capillary Refill: Capillary refill takes less than 2 seconds.     Findings: No erythema or rash.  Neurological:     Mental Status: She is alert and oriented to person, place, and time.     Motor: No abnormal muscle tone.     Coordination: Coordination normal.  Psychiatric:         Behavior: Behavior normal.      ED Treatments / Results  Labs (all labs ordered are listed, but only abnormal results are displayed) Labs Reviewed - No data to display  EKG    Radiology No results found.  Procedures Procedures (including critical care time)  Medications Ordered in ED Medications  ondansetron (ZOFRAN-ODT) disintegrating tablet 8 mg (8 mg Oral Given 08/05/18 1235)     Initial Impression / Assessment and Plan / ED Course  I have reviewed the triage vital signs and the nursing notes.  Pertinent labs & imaging results that were available during my care of the patient were reviewed by me and considered in my medical decision making (see chart for details).        Patient will be advised to follow-up with her primary doctor.  Told to return here as needed.  Patient is advised to follow-up with her primary doctor about placement as she feels that this is an issue she would like to pursue.  Final Clinical Impressions(s) / ED Diagnoses   Final diagnoses:  None    ED Discharge Orders    None       Kyra Manges 08/05/18 1336    Jacalyn Lefevre, MD 08/05/18 1359

## 2018-08-05 NOTE — ED Notes (Signed)
Meal tray given 

## 2018-08-05 NOTE — ED Notes (Signed)
Spoke with daughter Garnet Koyanagi [phone], she was given an update and made aware that her mother needs a ride home. Garnet Koyanagi sts she was unaware that her mother was at the hospital, but she will figure out a way to get her home.

## 2018-08-07 ENCOUNTER — Encounter (HOSPITAL_COMMUNITY): Payer: Self-pay | Admitting: Emergency Medicine

## 2018-08-07 ENCOUNTER — Emergency Department (HOSPITAL_COMMUNITY)
Admission: EM | Admit: 2018-08-07 | Discharge: 2018-08-07 | Disposition: A | Discharge status: Home / Self Care | Payer: Self-pay | Attending: Emergency Medicine | Admitting: Emergency Medicine

## 2018-08-07 DIAGNOSIS — J449 Chronic obstructive pulmonary disease, unspecified: Secondary | ICD-10-CM | POA: Insufficient documentation

## 2018-08-07 DIAGNOSIS — Z79899 Other long term (current) drug therapy: Secondary | ICD-10-CM | POA: Insufficient documentation

## 2018-08-07 DIAGNOSIS — G894 Chronic pain syndrome: Secondary | ICD-10-CM | POA: Insufficient documentation

## 2018-08-07 DIAGNOSIS — F17210 Nicotine dependence, cigarettes, uncomplicated: Secondary | ICD-10-CM | POA: Insufficient documentation

## 2018-08-07 MED ORDER — HYDROCODONE-ACETAMINOPHEN 5-325 MG PO TABS
1.0000 | ORAL_TABLET | Freq: Once | ORAL | Status: AC
  Administered 2018-08-07: 1 via ORAL
  Filled 2018-08-07: qty 1

## 2018-08-07 MED ORDER — GABAPENTIN 100 MG PO CAPS
200.0000 mg | ORAL_CAPSULE | Freq: Once | ORAL | Status: AC
  Administered 2018-08-07: 200 mg via ORAL
  Filled 2018-08-07: qty 2

## 2018-08-07 NOTE — ED Notes (Signed)
Bed: WA20 Expected date:  Expected time:  Means of arrival:  Comments: EMS-Wants placement

## 2018-08-07 NOTE — ED Provider Notes (Signed)
[facility]   CSN: [id number] Arrival date & time: 08/07/18  5597     History   Chief Complaint Chief Complaint  Patient presents with  . decreased mobility    HPI Anne Black is a 58 y.o. female.     HPI Patient is a 58 year old female who is well-known to this emergency department who presents the emergency department resulting in generalized pain.  She is on home oxygen.  She has a longstanding history of recurrent pain.  She states she is out of her Neurontin.  She reports wanting her breakfast tray and she wants placement into a nursing facility.  The patient was recently placed in a nursing home but she left.  She is utilized all the home health agencies and been verbally abusive to the staff and therefore no home health agencies will no longer go to her house to provide home health services.  The patient has been seen evaluated by our case management and social work team multiple times.  There is limited resources left for this patient.  She gets around with a wheelchair and a walker and lives at home.  She has a daughter who lives locally.  She reports that she is unable live with her daughter.  She has no other focus complaints at this time.  She denies fevers or chills.  No other complaints.   Past Medical History:  Diagnosis Date  . ADHD (attention deficit hyperactivity disorder)   . Alcoholic cirrhosis (HCC)    with ascites  . Alcoholism (HCC)   . Allergies   . Anxiety   . Arthritis   . Atrial fibrillation with RVR (HCC)   . Bipolar disorder (HCC)   . COPD (chronic obstructive pulmonary disease) (HCC)    wears 2L chronic O2  . Dyspnea   . EtOH dependence (HCC) 05/14/2018  . GERD (gastroesophageal reflux disease)   . History of hiatal hernia   . Hypokalemia   . Migraine   . Multiple sclerosis (HCC)   . Narcolepsy   . Osteoarthritis of knee   . Osteoporosis   . Pneumonia     Patient Active Problem List   Diagnosis Date Noted  . Cellulitis of lower extremity   . Peripheral edema   . Atrial fibrillation with RVR (HCC) 06/13/2018  . Alcohol dependence (HCC) 05/06/2018  . Electrolyte disturbance 05/05/2018  . Alcohol withdrawal delirium (HCC) 04/28/2018  . Weakness generalized 04/27/2018  . Gait abnormality 04/24/2018  . Chronic hypoxemic respiratory failure (HCC) 04/11/2018  . Atrial fibrillation with rapid ventricular response (HCC) 03/23/2018  . Head injury   . Fall 03/19/2018  . Syncope 03/19/2018  . Orbital fracture 03/19/2018  . Scalp laceration 03/19/2018  . Acute respiratory failure with hypoxia and hypercarbia (HCC) 03/19/2018  . Abdominal tenderness 03/19/2018  . Acute on chronic respiratory failure with hypoxia (HCC) 03/17/2018  . Alcohol abuse with intoxication (HCC) 03/17/2018  . ETOH abuse   . Normocytic anemia 03/16/2018  . Acute metabolic encephalopathy 03/16/2018  . Lower extremity edema 03/16/2018  . Migraine 03/16/2018  . Community acquired pneumonia of left lower lobe of lung (HCC)   . Weakness 08/21/2017  . COPD exacerbation (HCC) 06/12/2017  . Chest pain with moderate risk for cardiac etiology 05/30/2017  . Abnormal LFTs 05/30/2017  . Tachycardia 05/30/2017  . COPD (chronic obstructive pulmonary disease) (HCC) 04/19/2017  . Right rib fracture 04/15/2017  . Abdominal wall cellulitis 12/30/2016  . S/P exploratory laparotomy 12/30/2016  .  Distal radius fracture, left 09/09/2016  . Thrombocytopenia (HCC) 09/09/2016  . Chest x-ray abnormality   . Umbilical hernia without obstruction and without gangrene 05/12/2016  . Itching 02/24/2016  . Ventral hernia without obstruction or gangrene 02/24/2016  . Palliative care encounter   . Ascites   . Tachypnea   . Atrial flutter with rapid ventricular response (HCC)   . Hypomagnesemia   . Hypoxia   . Dehydration 01/22/2016  . Low back ache 12/30/2015  . Non-intractable vomiting with nausea   . Alcohol abuse with  alcohol-induced mood disorder (HCC) 12/15/2015  . Acute alcoholism (HCC)   . Distended abdomen 11/04/2015  . Alcohol abuse   . Dyspnea   . Acute respiratory failure (HCC) 10/15/2015  . Ascites due to alcoholic cirrhosis (HCC) 10/15/2015  . Osteoporosis   . Multiple sclerosis (HCC)   . Alcoholic cirrhosis of liver with ascites (HCC) 08/22/2015  . Bipolar disorder, current episode mixed, moderate (HCC) 08/01/2015  . Alcohol use disorder, severe, dependence (HCC) 07/31/2015  . Macrocytic anemia- due to alcohol abuse with normal B12 & folate levels 05/31/2015  . Severe protein-calorie malnutrition (HCC) 05/31/2015  . Hypokalemia 05/26/2015  . Encephalopathy, hepatic (HCC) 05/26/2015  . Alcohol withdrawal (HCC) 05/18/2015  . Abdominal pain 05/18/2015  . Anxiety 05/18/2015  . Stimulant abuse (HCC) 12/27/2014  . Nicotine abuse 12/27/2014  . Hyperprolactinemia (HCC) 11/15/2014  . Dyslipidemia   . Alcohol use disorder, moderate, dependence (HCC) 10/30/2014  . Tobacco abuse 01/23/2014  . Bipolar 1 disorder (HCC) 04/07/2013  . Noncompliance with therapeutic plan 04/04/2013  . Hereditary and idiopathic peripheral neuropathy 05/01/2012  . Depression 05/01/2012  . GERD (gastroesophageal reflux disease) 05/01/2012  . Osteoarthrosis, unspecified whether generalized or localized, involving lower leg 05/01/2012  . Hyponatremia 07/01/2011    Past Surgical History:  Procedure Laterality Date  . ABDOMINAL WALL DEFECT REPAIR N/A 12/30/2016   Procedure: EXPLORATORY LAPAROTOMY WITH REPAIR ABDOMINAL WALL VENTRAL HERNIA;  Surgeon: Emelia LoronWakefield, Matthew, MD;  [facility];  Service: General;  Laterality: N/A;  . APPLICATION OF WOUND VAC N/A 12/30/2016   Procedure: APPLICATION OF WOUND VAC;  Surgeon: Emelia LoronWakefield, Matthew, MD;  [facility];  Service: General;  Laterality: N/A;  . CESAREAN SECTION  [phone]  . FRACTURE SURGERY    . HERNIA REPAIR    . IR PARACENTESIS  06/14/2018  . MYRINGOTOMY WITH  TUBE PLACEMENT Bilateral   . ORIF WRIST FRACTURE Left 09/09/2016   Procedure: OPEN REDUCTION INTERNAL FIXATION (ORIF) LEFT WRIST FRACTURE, LEFT CARPAL TUNNEL RELEASE;  Surgeon: Dominica SeverinGramig, William, MD;  Location: [facility];  Service: Orthopedics;  Laterality: Left;  . TONSILLECTOMY       OB History   No obstetric history on file.      Home Medications    Prior to Admission medications   Medication Sig Start Date End Date Taking? Authorizing Provider  acetaminophen (TYLENOL) 500 MG tablet Take 1 tablet (500 mg total) by mouth every 6 (six) hours as needed for mild pain. Patient taking differently: Take 1,000 mg by mouth every 6 (six) hours as needed for mild pain.  03/28/18   Rolly SalterPatel, Pranav M, MD  diltiazem (CARDIZEM CD) 120 MG 24 hr capsule Take 1 capsule (120 mg total) by mouth daily for 30 days. 04/14/18 08/05/18  Tegeler, Canary Brimhristopher J, MD  famotidine (PEPCID) 20 MG tablet Take 1 tablet (20 mg total) by mouth daily for 14 days. 07/24/18 08/07/18  Caccavale, Sophia, PA-C  furosemide (LASIX) 40 MG tablet Take 1 tablet (  40 mg total) by mouth daily. 06/27/18   Sabas SousBero, Michael M, MD  gabapentin (NEURONTIN) 100 MG capsule Take 2 capsules (200 mg total) by mouth 3 (three) times daily for 30 days. 07/19/18 08/18/18  Fayrene Helperran, Bowie, PA-C  hydrOXYzine (ATARAX/VISTARIL) 25 MG tablet Take 1 tablet (25 mg total) by mouth daily as needed for up to 30 doses (anxiety). Patient not taking: Reported on 08/05/2018 04/14/18   Tegeler, Canary Brimhristopher J, MD  methocarbamol (ROBAXIN) 500 MG tablet Take 1 tablet (500 mg total) by mouth 2 (two) times daily as needed for muscle spasms. 06/27/18   Sabas SousBero, Michael M, MD  ondansetron (ZOFRAN) 4 MG tablet Take 4 mg by mouth every 8 (eight) hours as needed for nausea or vomiting.    [provider]  ondansetron (ZOFRAN) 4 MG tablet Take 1 tablet (4 mg total) by mouth every 6 (six) hours. 08/05/18   Lawyer, Cristal Deerhristopher, PA-C  pantoprazole (PROTONIX) 20 MG tablet Take 2 tablets (40 mg total) by  mouth daily. 07/29/18   Mesner, Barbara CowerJason, MD  pantoprazole (PROTONIX) 20 MG tablet Take 1 tablet (20 mg total) by mouth daily. 08/05/18   Lawyer, Cristal Deerhristopher, PA-C  potassium chloride SA (K-DUR,KLOR-CON) 20 MEQ tablet Take 2 tablets (40 mEq total) by mouth 2 (two) times daily. Patient not taking: Reported on 08/05/2018 04/19/18   Bethel BornGekas, Kelly Marie, PA-C  spironolactone (ALDACTONE) 25 MG tablet Take 1 tablet (25 mg total) by mouth daily for 30 days. 06/27/18 08/05/18  Sabas SousBero, Michael M, MD    Family History Family History  Problem Relation Age of Onset  . Arrhythmia Mother   . Neuropathy Mother   . Coronary artery disease Mother   . Heart disease Mother   . Heart disease Father   . Hypertension Father   . Heart attack Father   . Multiple sclerosis Maternal Aunt     Social History Social History   Tobacco Use  . Smoking status: Current Every Day Smoker    Packs/day: 1.00    Years: 36.00    Pack years: 36.00    Types: Cigarettes  . Smokeless tobacco: Never Used  Substance Use Topics  . Alcohol use: Yes    Comment: Chronic ETOH abuse  . Drug use: No     Allergies   Patient has no known allergies.   Review of Systems Review of Systems  All other systems reviewed and are negative.    Physical Exam Updated Vital Signs BP (!) 108/57 (BP Location: Left Arm)   Pulse 91   Temp 98 F (36.7 C) (Oral)   Resp 18   SpO2 99%   Physical Exam Vitals signs and nursing note reviewed.  Constitutional:      Appearance: She is well-developed.  HENT:     Head: Normocephalic.  Neck:     Musculoskeletal: Normal range of motion.  Pulmonary:     Effort: Pulmonary effort is normal.  Abdominal:     General: There is no distension.  Musculoskeletal: Normal range of motion.     Comments: Moves all 4 extremities  Neurological:     Mental Status: She is alert and oriented to person, place, and time.      ED Treatments / Results  Labs (all labs ordered are listed, but only abnormal  results are displayed) Labs Reviewed - No data to display  EKG    Radiology No results found.  Procedures Procedures (including critical care time)  Medications Ordered in ED Medications  HYDROcodone-acetaminophen (NORCO/VICODIN) 5-325 MG  per tablet 1 tablet (has no administration in time range)  gabapentin (NEURONTIN) capsule 200 mg (has no administration in time range)     Initial Impression / Assessment and Plan / ED Course  I have reviewed the triage vital signs and the nursing notes.  Pertinent labs & imaging results that were available during my care of the patient were reviewed by me and considered in my medical decision making (see chart for details).        Chronic pain treated here in the emergency department.  The patient has a refill of her Neurontin at the drugstore and I recommended that she go to the drugstore and fill this or have a family member fill it for her.  Unfortunately patient has maximized the majority of her home health resources available to her and maximize the utility of involvement of case management and social work.  There is no other resources available to provide the patient at this time.  I recommended a primary care follow-up visit  Final Clinical Impressions(s) / ED Diagnoses   Final diagnoses:  Chronic pain syndrome    ED Discharge Orders    None       Jola Schmidt, MD 08/07/18 [phone]

## 2018-08-07 NOTE — ED Notes (Signed)
Attempted to call pt daughter to pick up pt. No answer x 2 and message says voice mailbox is full. Will look into other options for pt transportation.

## 2018-08-07 NOTE — ED Notes (Signed)
Patient complaining of pain, needing oxygen(placed on 2L), wanting food, Neurontin, etc. Patient states she didn't eat all day yesterday and she'd appreciate a breakfast tray.

## 2018-08-07 NOTE — ED Triage Notes (Signed)
Per EMS-second time this morning she has called EMS-complaining of neuropathy pain, out of Neurontin- mobility issues and problems with ADLs-patient was at [facility] but left-wants home health care or placed in a facility-patient is already demanding a food tray etc

## 2018-08-09 IMAGING — CR DG PELVIS 1-2V
1 series · 1 of 1 positions shown · non-contrast
Comparison: None.

CLINICAL DATA: Fall.  Pelvic pain.  Initial encounter.

EXAM:
PELVIS - 1-2 VIEW

[t pelvis ap]
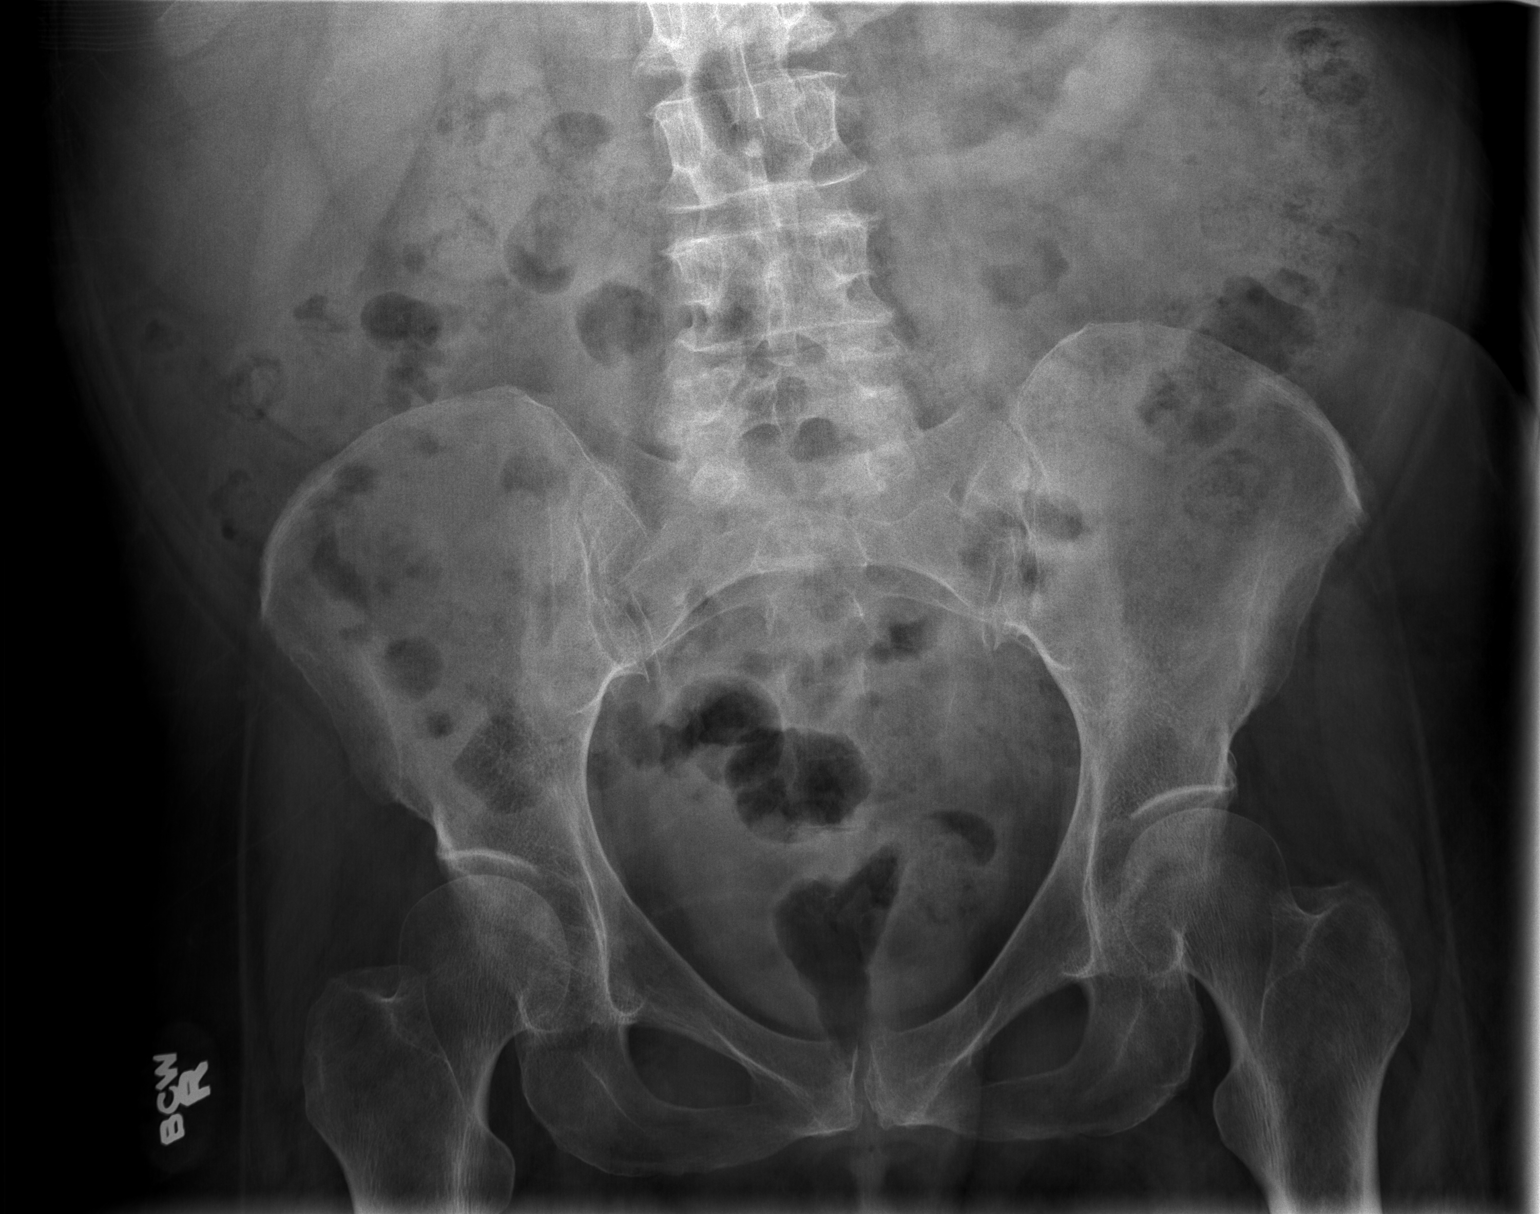

[1 of 1 positions shown; findings below may reference images not displayed]

FINDINGS: There is no evidence of pelvic fracture or diastasis. No pelvic bone
lesions are seen.
IMPRESSION: Negative.

## 2018-08-09 IMAGING — CR DG LUMBAR SPINE COMPLETE 4+V
5 series · 5 of 5 positions shown · non-contrast
Comparison: Abdominal CT 05/18/2015

CLINICAL DATA: Fall with bilateral hip and lumbar spine pain.
Initial encounter.

EXAM:
LUMBAR SPINE - COMPLETE 4+ VIEW

[t lumbar spine ap]
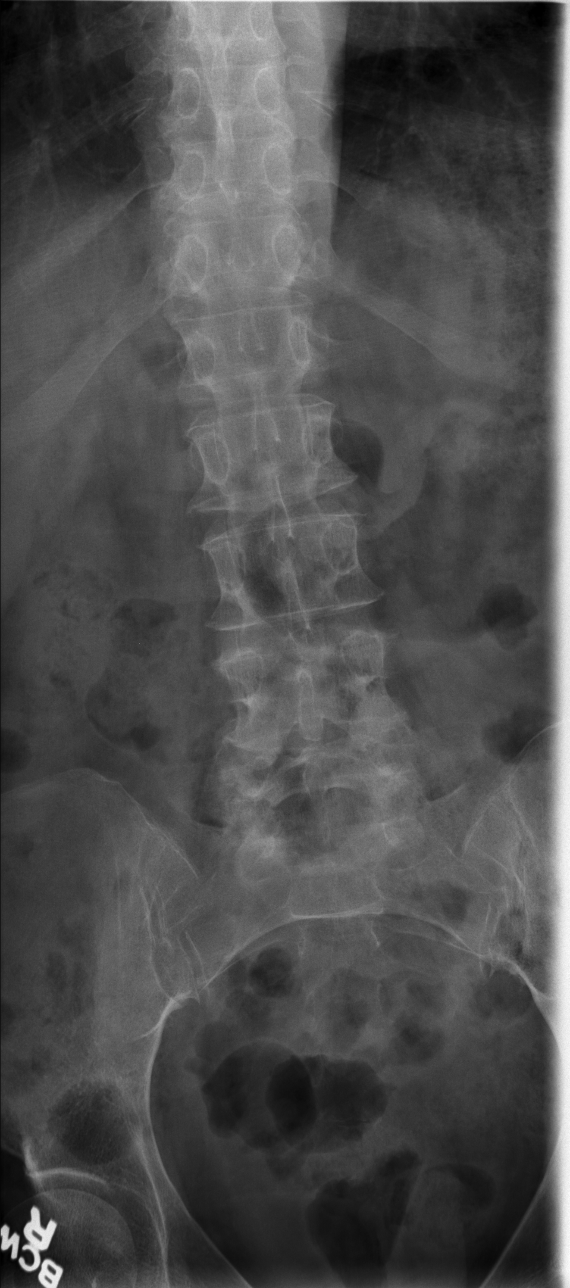

[t lumbar spine obl (1 of 2)]
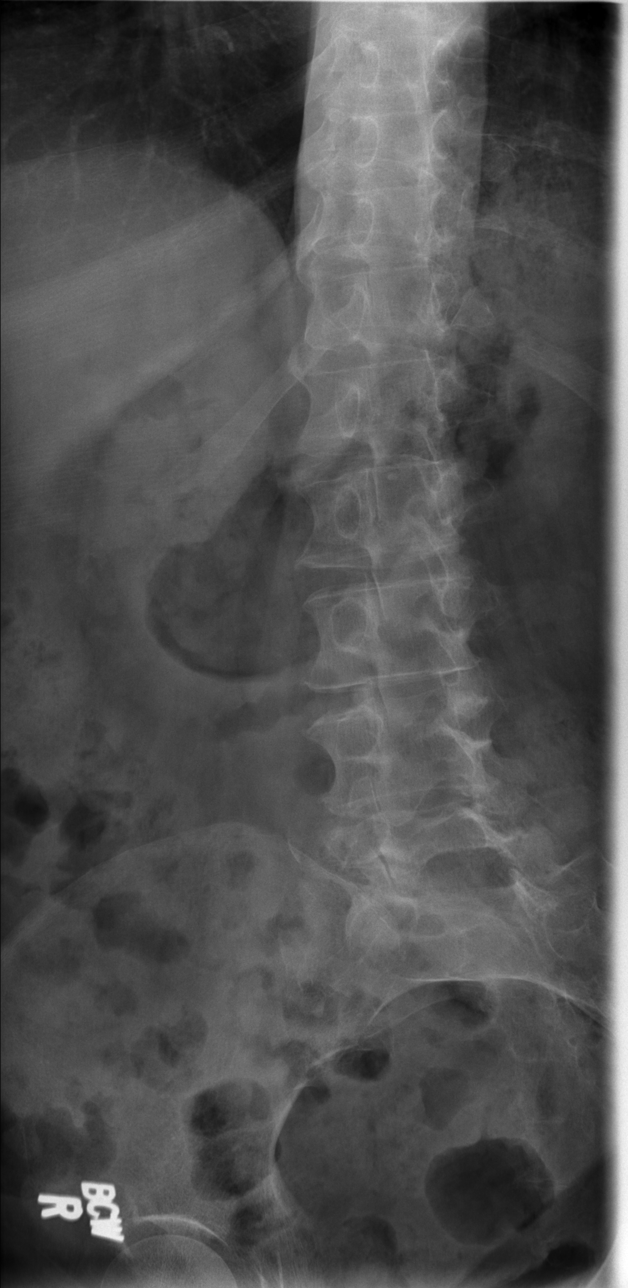

[t lumbar spine obl (2 of 2)]
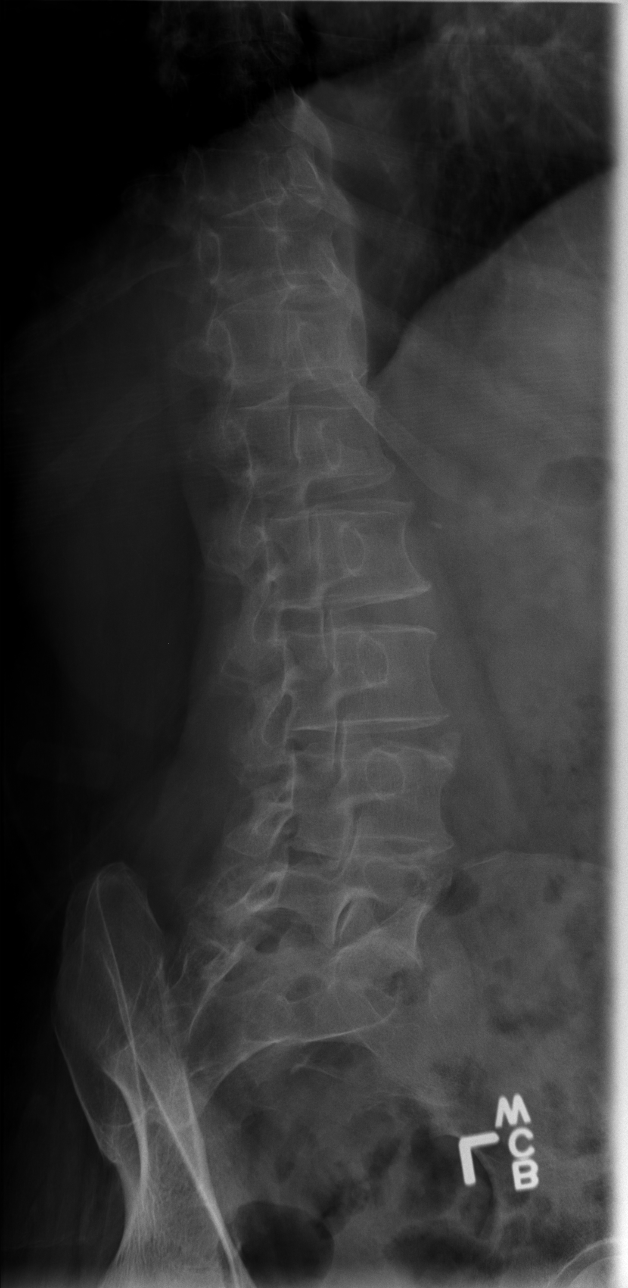

[t lumbar spine lat]
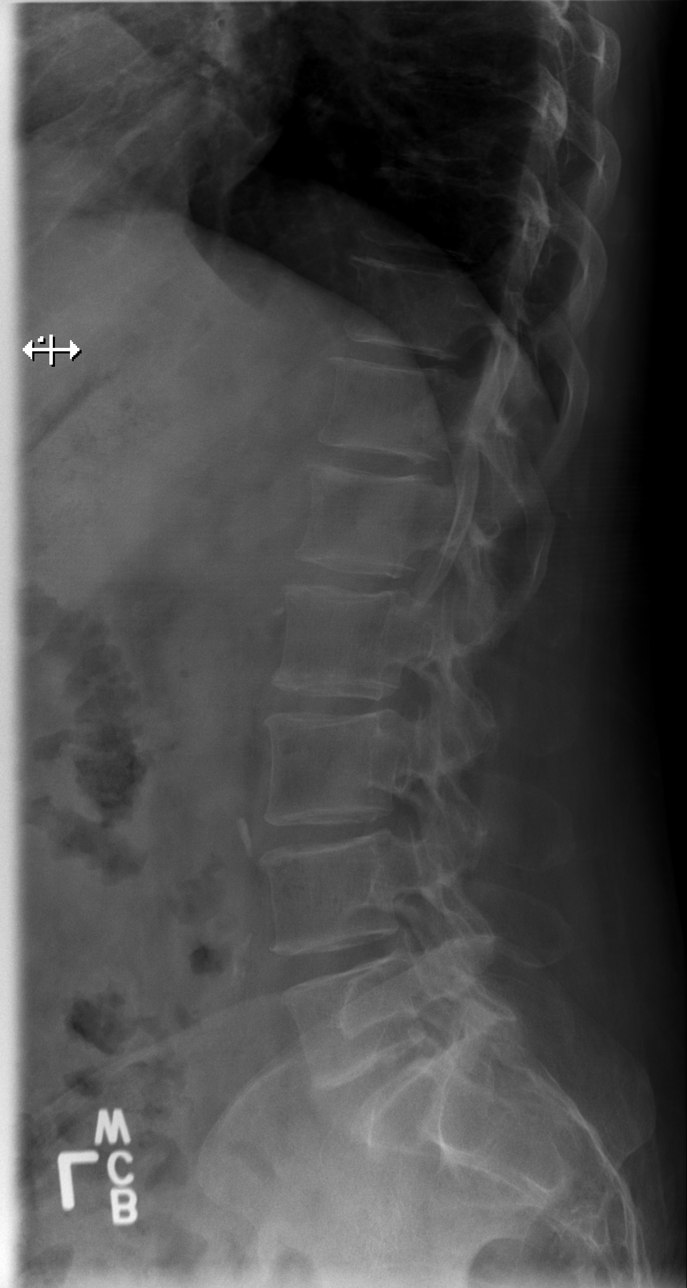

[t lumbar l-5 s-1 spot]
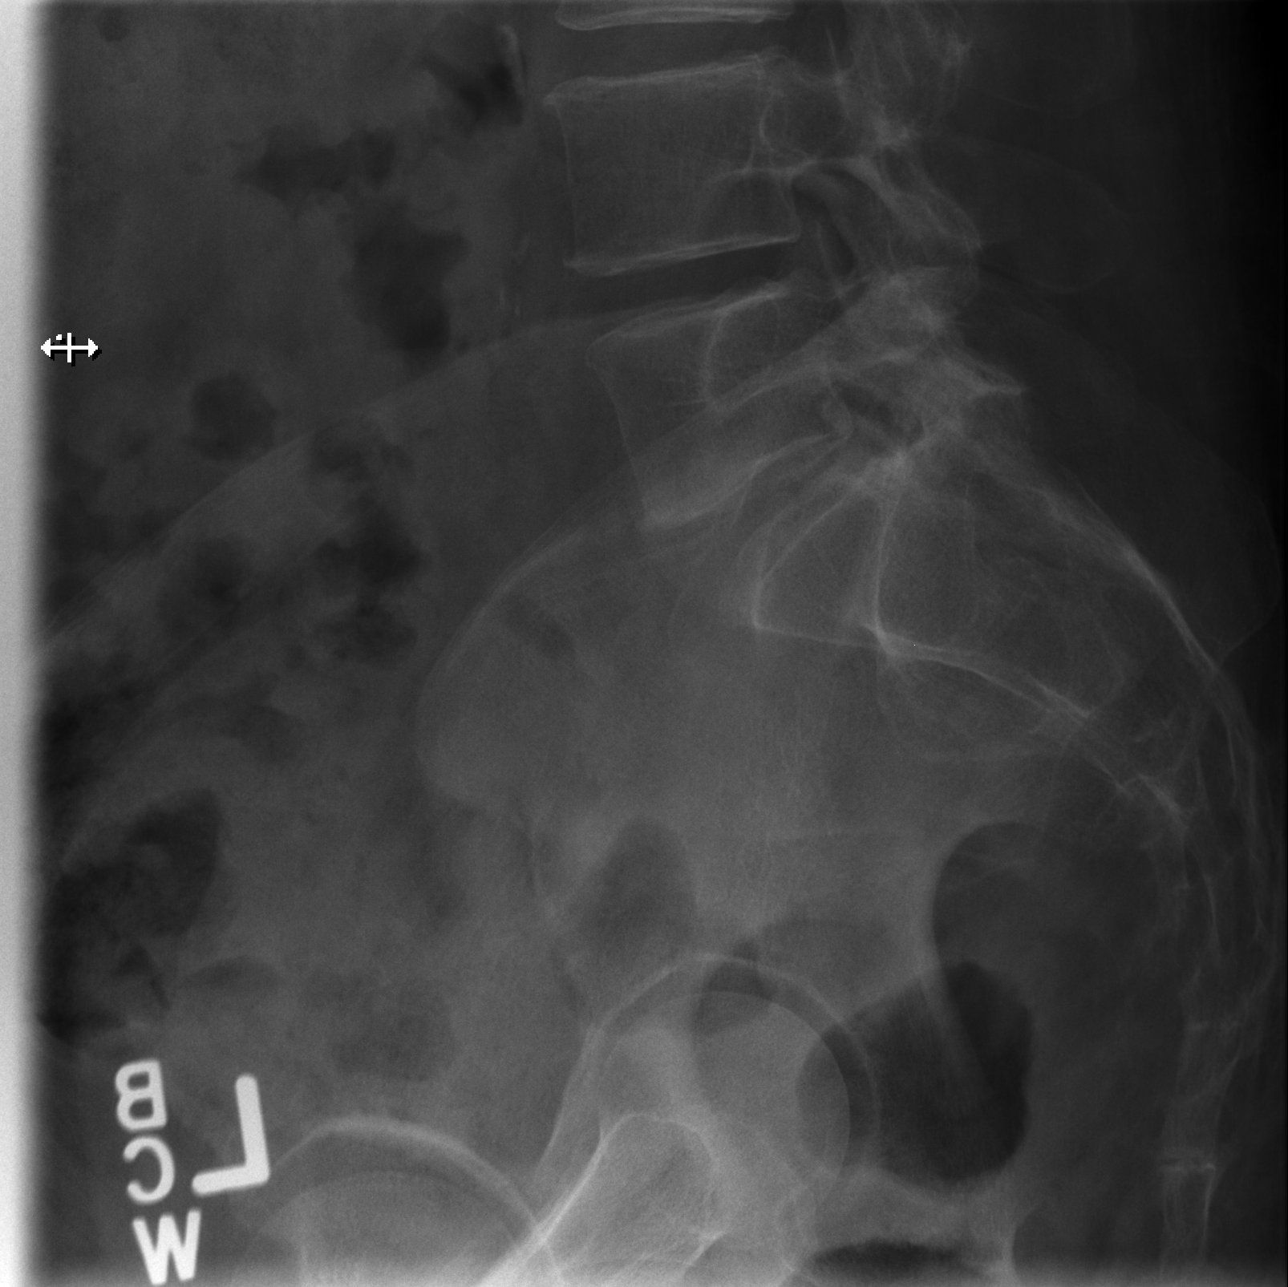

[5 of 5 positions shown; findings below may reference images not displayed]

FINDINGS: There is no evidence of lumbar spine fracture. Alignment is normal.
Intervertebral disc spaces are maintained. Facet arthropathy on the
right at L5-S1.
IMPRESSION: No evidence of injury.

## 2018-08-10 ENCOUNTER — Inpatient Hospital Stay (HOSPITAL_COMMUNITY)
Admission: EM | Admit: 2018-08-10 | Discharge: 2018-08-20 | DRG: 871 | Disposition: A | Discharge status: Skilled Nursing Facility | Payer: Medicare Other | Attending: Family Medicine | Admitting: Family Medicine

## 2018-08-10 ENCOUNTER — Emergency Department (HOSPITAL_COMMUNITY): Payer: Medicare Other

## 2018-08-10 ENCOUNTER — Other Ambulatory Visit: Payer: Self-pay

## 2018-08-10 ENCOUNTER — Encounter (HOSPITAL_COMMUNITY): Payer: Self-pay | Admitting: Emergency Medicine

## 2018-08-10 DIAGNOSIS — K219 Gastro-esophageal reflux disease without esophagitis: Secondary | ICD-10-CM | POA: Diagnosis present

## 2018-08-10 DIAGNOSIS — F319 Bipolar disorder, unspecified: Secondary | ICD-10-CM | POA: Diagnosis present

## 2018-08-10 DIAGNOSIS — N179 Acute kidney failure, unspecified: Secondary | ICD-10-CM | POA: Diagnosis present

## 2018-08-10 DIAGNOSIS — E669 Obesity, unspecified: Secondary | ICD-10-CM | POA: Diagnosis present

## 2018-08-10 DIAGNOSIS — F10129 Alcohol abuse with intoxication, unspecified: Secondary | ICD-10-CM | POA: Diagnosis present

## 2018-08-10 DIAGNOSIS — G35 Multiple sclerosis: Secondary | ICD-10-CM | POA: Diagnosis present

## 2018-08-10 DIAGNOSIS — Y905 Blood alcohol level of 100-119 mg/100 ml: Secondary | ICD-10-CM | POA: Diagnosis present

## 2018-08-10 DIAGNOSIS — K30 Functional dyspepsia: Secondary | ICD-10-CM | POA: Diagnosis present

## 2018-08-10 DIAGNOSIS — K7200 Acute and subacute hepatic failure without coma: Secondary | ICD-10-CM

## 2018-08-10 DIAGNOSIS — F909 Attention-deficit hyperactivity disorder, unspecified type: Secondary | ICD-10-CM | POA: Diagnosis present

## 2018-08-10 DIAGNOSIS — Z6837 Body mass index (BMI) 37.0-37.9, adult: Secondary | ICD-10-CM

## 2018-08-10 DIAGNOSIS — I480 Paroxysmal atrial fibrillation: Secondary | ICD-10-CM | POA: Diagnosis present

## 2018-08-10 DIAGNOSIS — I482 Chronic atrial fibrillation, unspecified: Secondary | ICD-10-CM | POA: Diagnosis present

## 2018-08-10 DIAGNOSIS — K7031 Alcoholic cirrhosis of liver with ascites: Secondary | ICD-10-CM | POA: Diagnosis present

## 2018-08-10 DIAGNOSIS — R6510 Systemic inflammatory response syndrome (SIRS) of non-infectious origin without acute organ dysfunction: Secondary | ICD-10-CM

## 2018-08-10 DIAGNOSIS — T68XXXA Hypothermia, initial encounter: Secondary | ICD-10-CM | POA: Diagnosis present

## 2018-08-10 DIAGNOSIS — I5033 Acute on chronic diastolic (congestive) heart failure: Secondary | ICD-10-CM | POA: Diagnosis present

## 2018-08-10 DIAGNOSIS — Z1159 Encounter for screening for other viral diseases: Secondary | ICD-10-CM

## 2018-08-10 DIAGNOSIS — Z8249 Family history of ischemic heart disease and other diseases of the circulatory system: Secondary | ICD-10-CM

## 2018-08-10 DIAGNOSIS — N390 Urinary tract infection, site not specified: Secondary | ICD-10-CM | POA: Diagnosis present

## 2018-08-10 DIAGNOSIS — F17200 Nicotine dependence, unspecified, uncomplicated: Secondary | ICD-10-CM | POA: Diagnosis present

## 2018-08-10 DIAGNOSIS — K7682 Hepatic encephalopathy: Secondary | ICD-10-CM

## 2018-08-10 DIAGNOSIS — J449 Chronic obstructive pulmonary disease, unspecified: Secondary | ICD-10-CM | POA: Diagnosis present

## 2018-08-10 DIAGNOSIS — J9611 Chronic respiratory failure with hypoxia: Secondary | ICD-10-CM | POA: Diagnosis present

## 2018-08-10 DIAGNOSIS — K7040 Alcoholic hepatic failure without coma: Secondary | ICD-10-CM | POA: Diagnosis present

## 2018-08-10 DIAGNOSIS — Z79899 Other long term (current) drug therapy: Secondary | ICD-10-CM

## 2018-08-10 DIAGNOSIS — D638 Anemia in other chronic diseases classified elsewhere: Secondary | ICD-10-CM | POA: Diagnosis present

## 2018-08-10 DIAGNOSIS — F17210 Nicotine dependence, cigarettes, uncomplicated: Secondary | ICD-10-CM | POA: Diagnosis present

## 2018-08-10 DIAGNOSIS — Z7189 Other specified counseling: Secondary | ICD-10-CM

## 2018-08-10 DIAGNOSIS — F419 Anxiety disorder, unspecified: Secondary | ICD-10-CM | POA: Diagnosis present

## 2018-08-10 DIAGNOSIS — I959 Hypotension, unspecified: Secondary | ICD-10-CM | POA: Diagnosis present

## 2018-08-10 DIAGNOSIS — E871 Hypo-osmolality and hyponatremia: Secondary | ICD-10-CM | POA: Diagnosis present

## 2018-08-10 DIAGNOSIS — Z66 Do not resuscitate: Secondary | ICD-10-CM | POA: Diagnosis present

## 2018-08-10 DIAGNOSIS — Z9981 Dependence on supplemental oxygen: Secondary | ICD-10-CM

## 2018-08-10 DIAGNOSIS — A419 Sepsis, unspecified organism: Secondary | ICD-10-CM | POA: Diagnosis present

## 2018-08-10 DIAGNOSIS — Z820 Family history of epilepsy and other diseases of the nervous system: Secondary | ICD-10-CM

## 2018-08-10 DIAGNOSIS — R0602 Shortness of breath: Secondary | ICD-10-CM

## 2018-08-10 DIAGNOSIS — Z515 Encounter for palliative care: Secondary | ICD-10-CM

## 2018-08-10 DIAGNOSIS — R188 Other ascites: Secondary | ICD-10-CM

## 2018-08-10 DIAGNOSIS — D649 Anemia, unspecified: Secondary | ICD-10-CM

## 2018-08-10 LAB — GRAM STAIN

## 2018-08-10 LAB — LIPASE, BLOOD: Lipase: 35 U/L (ref 11–51)

## 2018-08-10 LAB — CBC WITH DIFFERENTIAL/PLATELET
Abs Immature Granulocytes: 0.02 10*3/uL (ref 0.00–0.07)
Basophils Absolute: 0.1 10*3/uL (ref 0.0–0.1)
Basophils Relative: 1 %
Eosinophils Absolute: 0.1 10*3/uL (ref 0.0–0.5)
Eosinophils Relative: 1 %
HCT: 27.2 % — ABNORMAL LOW (ref 36.0–46.0)
Hemoglobin: 8.5 g/dL — ABNORMAL LOW (ref 12.0–15.0)
Immature Granulocytes: 0 %
Lymphocytes Relative: 18 %
Lymphs Abs: 1 10*3/uL (ref 0.7–4.0)
MCH: 25.1 pg — ABNORMAL LOW (ref 26.0–34.0)
MCHC: 31.3 g/dL (ref 30.0–36.0)
MCV: 80.5 fL (ref 80.0–100.0)
Monocytes Absolute: 0.6 10*3/uL (ref 0.1–1.0)
Monocytes Relative: 12 %
Neutro Abs: 3.7 10*3/uL (ref 1.7–7.7)
Neutrophils Relative %: 68 %
Platelets: 114 10*3/uL — ABNORMAL LOW (ref 150–400)
RBC: 3.38 MIL/uL — ABNORMAL LOW (ref 3.87–5.11)
RDW: 21.3 % — ABNORMAL HIGH (ref 11.5–15.5)
WBC: 5.4 10*3/uL (ref 4.0–10.5)
nRBC: 0 % (ref 0.0–0.2)

## 2018-08-10 LAB — COMPREHENSIVE METABOLIC PANEL
ALT: 25 U/L (ref 0–44)
AST: 49 U/L — ABNORMAL HIGH (ref 15–41)
Albumin: 2.1 g/dL — ABNORMAL LOW (ref 3.5–5.0)
Alkaline Phosphatase: 151 U/L — ABNORMAL HIGH (ref 38–126)
Anion gap: 11 (ref 5–15)
BUN: 7 mg/dL (ref 6–20)
CO2: 27 mmol/L (ref 22–32)
Calcium: 7.6 mg/dL — ABNORMAL LOW (ref 8.9–10.3)
Chloride: 85 mmol/L — ABNORMAL LOW (ref 98–111)
Creatinine, Ser: 1.05 mg/dL — ABNORMAL HIGH (ref 0.44–1.00)
GFR calc Af Amer: >60 mL/min (ref >60)
GFR calc non Af Amer: 58 mL/min — ABNORMAL LOW (ref >60)
Glucose, Bld: 101 mg/dL — ABNORMAL HIGH (ref 70–99)
Potassium: 4.3 mmol/L (ref 3.5–5.1)
Sodium: 123 mmol/L — ABNORMAL LOW (ref 135–145)
Total Bilirubin: 3 mg/dL — ABNORMAL HIGH (ref 0.3–1.2)
Total Protein: 5.9 g/dL — ABNORMAL LOW (ref 6.5–8.1)

## 2018-08-10 LAB — URINALYSIS, ROUTINE W REFLEX MICROSCOPIC
Bilirubin Urine: NEGATIVE
Glucose, UA: NEGATIVE mg/dL
Ketones, ur: NEGATIVE mg/dL
Nitrite: NEGATIVE
Protein, ur: NEGATIVE mg/dL
Specific Gravity, Urine: 1.015 (ref 1.005–1.030)
WBC, UA: >50 WBC/hpf — ABNORMAL HIGH (ref 0–5)
pH: 5 (ref 5.0–8.0)

## 2018-08-10 LAB — PROTEIN, PLEURAL OR PERITONEAL FLUID: Total protein, fluid: <3 g/dL

## 2018-08-10 LAB — LACTIC ACID, PLASMA: Lactic Acid, Venous: 2.4 mmol/L (ref 0.5–1.9)

## 2018-08-10 LAB — PROTIME-INR
INR: 1.7 — ABNORMAL HIGH (ref 0.8–1.2)
Prothrombin Time: 19.5 seconds — ABNORMAL HIGH (ref 11.4–15.2)

## 2018-08-10 LAB — GLUCOSE, PLEURAL OR PERITONEAL FLUID: Glucose, Fluid: 106 mg/dL

## 2018-08-10 LAB — ALBUMIN, PLEURAL OR PERITONEAL FLUID: Albumin, Fluid: <1 g/dL

## 2018-08-10 LAB — LACTATE DEHYDROGENASE: LDH: 18 U/L — ABNORMAL LOW (ref 98–192)

## 2018-08-10 LAB — SARS CORONAVIRUS 2 BY RT PCR (HOSPITAL ORDER, PERFORMED IN ~~LOC~~ HOSPITAL LAB): SARS Coronavirus 2: NEGATIVE

## 2018-08-10 LAB — LACTATE DEHYDROGENASE, PLEURAL OR PERITONEAL FLUID: LD, Fluid: 17 U/L (ref 3–23)

## 2018-08-10 LAB — AMMONIA: Ammonia: 66 umol/L — ABNORMAL HIGH (ref 9–35)

## 2018-08-10 MED ORDER — LACTATED RINGERS IV BOLUS
1000.0000 mL | Freq: Once | INTRAVENOUS | Status: AC
  Administered 2018-08-10: 20:00:00 1000 mL via INTRAVENOUS

## 2018-08-10 MED ORDER — VITAMIN B-1 100 MG PO TABS
100.0000 mg | ORAL_TABLET | Freq: Every day | ORAL | Status: DC
  Administered 2018-08-11 – 2018-08-20 (×10): 100 mg via ORAL
  Filled 2018-08-10 (×10): qty 1

## 2018-08-10 MED ORDER — IPRATROPIUM-ALBUTEROL 0.5-2.5 (3) MG/3ML IN SOLN: 3.0000 mL | Freq: Four times a day (QID) | RESPIRATORY_TRACT | Status: DC | PRN

## 2018-08-10 MED ORDER — LORAZEPAM 1 MG PO TABS
1.0000 mg | ORAL_TABLET | Freq: Four times a day (QID) | ORAL | Status: AC | PRN
  Administered 2018-08-11 – 2018-08-13 (×2): 1 mg via ORAL
  Filled 2018-08-10 (×2): qty 1

## 2018-08-10 MED ORDER — ENOXAPARIN SODIUM 40 MG/0.4ML ~~LOC~~ SOLN: 40.0000 mg | Freq: Every day | SUBCUTANEOUS | Status: DC

## 2018-08-10 MED ORDER — FOLIC ACID 1 MG PO TABS
1.0000 mg | ORAL_TABLET | Freq: Every day | ORAL | Status: DC
  Administered 2018-08-11 – 2018-08-20 (×10): 1 mg via ORAL
  Filled 2018-08-10 (×10): qty 1

## 2018-08-10 MED ORDER — ACETAMINOPHEN 650 MG RE SUPP: 650.0000 mg | Freq: Four times a day (QID) | RECTAL | Status: DC | PRN

## 2018-08-10 MED ORDER — ACETAMINOPHEN 325 MG PO TABS
650.0000 mg | ORAL_TABLET | Freq: Four times a day (QID) | ORAL | Status: DC | PRN
  Administered 2018-08-13 – 2018-08-20 (×13): 650 mg via ORAL
  Filled 2018-08-10 (×16): qty 2

## 2018-08-10 MED ORDER — LORAZEPAM 2 MG/ML IJ SOLN
0.0000 mg | Freq: Two times a day (BID) | INTRAMUSCULAR | Status: DC
  Administered 2018-08-13: 1 mg via INTRAVENOUS
  Filled 2018-08-10: qty 1

## 2018-08-10 MED ORDER — LACTULOSE 10 GM/15ML PO SOLN
10.0000 g | Freq: Two times a day (BID) | ORAL | Status: DC
  Administered 2018-08-11 – 2018-08-19 (×12): 10 g via ORAL
  Filled 2018-08-10 (×18): qty 15

## 2018-08-10 MED ORDER — LACTULOSE 10 GM/15ML PO SOLN: 20.0000 g | ORAL | Status: DC

## 2018-08-10 MED ORDER — ADULT MULTIVITAMIN W/MINERALS CH
1.0000 | ORAL_TABLET | Freq: Every day | ORAL | Status: DC
  Administered 2018-08-11 – 2018-08-20 (×10): 1 via ORAL
  Filled 2018-08-10 (×10): qty 1

## 2018-08-10 MED ORDER — NICOTINE 21 MG/24HR TD PT24
21.0000 mg | MEDICATED_PATCH | Freq: Every day | TRANSDERMAL | Status: DC
  Administered 2018-08-11 – 2018-08-20 (×11): 21 mg via TRANSDERMAL
  Filled 2018-08-10 (×11): qty 1

## 2018-08-10 MED ORDER — VANCOMYCIN HCL 10 G IV SOLR
1500.0000 mg | Freq: Once | INTRAVENOUS | Status: AC
  Administered 2018-08-10: 1500 mg via INTRAVENOUS
  Filled 2018-08-10: qty 1500

## 2018-08-10 MED ORDER — LORAZEPAM 2 MG/ML IJ SOLN
0.0000 mg | Freq: Four times a day (QID) | INTRAMUSCULAR | Status: AC
  Administered 2018-08-11 – 2018-08-12 (×5): 1 mg via INTRAVENOUS
  Filled 2018-08-10 (×7): qty 1

## 2018-08-10 MED ORDER — THIAMINE HCL 100 MG/ML IJ SOLN
100.0000 mg | Freq: Every day | INTRAMUSCULAR | Status: DC
  Filled 2018-08-10 (×2): qty 2

## 2018-08-10 MED ORDER — LIDOCAINE HCL (PF) 1 % IJ SOLN
5.0000 mL | Freq: Once | INTRAMUSCULAR | Status: AC
  Administered 2018-08-10: 5 mL
  Filled 2018-08-10: qty 5

## 2018-08-10 MED ORDER — LACTULOSE 10 GM/15ML PO SOLN
10.0000 g | Freq: Every day | ORAL | Status: DC | PRN
  Administered 2018-08-16: 10 g via ORAL
  Filled 2018-08-10: qty 15

## 2018-08-10 MED ORDER — SODIUM CHLORIDE 0.9 % IV SOLN
2.0000 g | Freq: Once | INTRAVENOUS | Status: AC
  Administered 2018-08-11: 2 g via INTRAVENOUS
  Filled 2018-08-10: qty 2

## 2018-08-10 MED ORDER — LORAZEPAM 2 MG/ML IJ SOLN
1.0000 mg | Freq: Four times a day (QID) | INTRAMUSCULAR | Status: AC | PRN
  Administered 2018-08-11 – 2018-08-12 (×3): 1 mg via INTRAVENOUS
  Filled 2018-08-10 (×2): qty 1

## 2018-08-10 NOTE — ED Notes (Signed)
ED TO INPATIENT HANDOFF REPORT  ED Nurse Name and Phone #: Tina @ [phone]  S Name/Age/Gender Anne Black 58 y.o. female Room/Bed: 022C/022C  Code Status   Code Status: Prior  Home/SNF/Other HomeInetta Black Patient oriented to: self, place and situation Is this baseline? Yes   Triage Complete: Triage complete  Chief Complaint Butt Pain  Triage Note Pt complains of "butt pain" arrives by EMS from home. Pt intoxicated, hx of alcohol abuse. Pt informed EMS that she wants to go back to a rehab facility. Pt denies any fall- pt was laying on a couch when EMS arrived. EMS reports house is cluttered, feces all over floor, horrible smells. BP 114/54, HR 96, spo2 98%. Pt wears 2L [place] at home  Pt observed to have stool on bottom of bil. Feet and on legs.    Allergies No Known Allergies  Level of Care/Admitting Diagnosis ED Disposition    ED Disposition Condition Comment   Admit  The patient appears reasonably stabilized for admission considering the current resources, flow, and capabilities available in the ED at this time, and I doubt any other [facility] requiring further screening and/or treatment in the ED prior to admission is  present.       B Medical/Surgery History Past Medical History:  Diagnosis Date  . ADHD (attention deficit hyperactivity disorder)   . Alcoholic cirrhosis (HCC)    with ascites  . Alcoholism (HCC)   . Allergies   . Anxiety   . Arthritis   . Atrial fibrillation with RVR (HCC)   . Bipolar disorder (HCC)   . COPD (chronic obstructive pulmonary disease) (HCC)    wears 2L chronic O2  . Dyspnea   . EtOH dependence (HCC) 05/14/2018  . GERD (gastroesophageal reflux disease)   . History of hiatal hernia   . Hypokalemia   . Migraine   . Multiple sclerosis (HCC)   . Narcolepsy   . Osteoarthritis of knee   . Osteoporosis   . Pneumonia    Past Surgical History:  Procedure Laterality Date  . ABDOMINAL WALL DEFECT REPAIR N/A 12/30/2016   Procedure: EXPLORATORY  LAPAROTOMY WITH REPAIR ABDOMINAL WALL VENTRAL HERNIA;  Surgeon: Emelia LoronWakefield, Matthew, MD;  [facility];  Service: General;  Laterality: N/A;  . APPLICATION OF WOUND VAC N/A 12/30/2016   Procedure: APPLICATION OF WOUND VAC;  Surgeon: Emelia LoronWakefield, Matthew, MD;  [facility];  Service: General;  Laterality: N/A;  . CESAREAN SECTION  [phone]  . FRACTURE SURGERY    . HERNIA REPAIR    . IR PARACENTESIS  06/14/2018  . MYRINGOTOMY WITH TUBE PLACEMENT Bilateral   . ORIF WRIST FRACTURE Left 09/09/2016   Procedure: OPEN REDUCTION INTERNAL FIXATION (ORIF) LEFT WRIST FRACTURE, LEFT CARPAL TUNNEL RELEASE;  Surgeon: Dominica SeverinGramig, William, MD;  Location: [facility] ORS;  Service: Orthopedics;  Laterality: Left;  . TONSILLECTOMY       A IV Location/Drains/Wounds Patient Lines/Drains/Airways Status   Active Line/Drains/Airways    Name:   Placement date:   Placement time:   Site:   Days:   Peripheral IV 08/10/18 Right Hand   08/10/18    2028    Hand   less than 1          Intake/Output Last 24 hours No intake or output data in the 24 hours ending 08/10/18 2104  Labs/Imaging Results for orders placed or performed during the hospital encounter of 08/10/18 (from the past 48 hour(s))  Comprehensive metabolic panel     Status: Abnormal  Collection Time: 08/10/18  6:03 PM  Result Value Ref Range   Sodium 123 (L) 135 - 145 mmol/L   Potassium 4.3 3.5 - 5.1 mmol/L   Chloride 85 (L) 98 - 111 mmol/L   CO2 27 22 - 32 mmol/L   Glucose, Bld 101 (H) 70 - 99 mg/dL   BUN 7 6 - 20 mg/dL   Creatinine, Ser 5.49 (H) 0.44 - 1.00 mg/dL   Calcium 7.6 (L) 8.9 - 10.3 mg/dL   Total Protein 5.9 (L) 6.5 - 8.1 g/dL   Albumin 2.1 (L) 3.5 - 5.0 g/dL   AST 49 (H) 15 - 41 U/L   ALT 25 0 - 44 U/L   Alkaline Phosphatase 151 (H) 38 - 126 U/L   Total Bilirubin 3.0 (H) 0.3 - 1.2 mg/dL   GFR calc non Af Amer 58 (L) >60 mL/min   GFR calc Af Amer >60 >60 mL/min   Anion gap 11 5 - 15    Comment: Performed at [facility], [address], [place], [place] [address]  Lipase, blood     Status: None   Collection Time: 08/10/18  6:03 PM  Result Value Ref Range   Lipase 35 11 - 51 U/L    Comment: Performed at [facility], [address] [place], [place] [address]  CBC with Diff     Status: Abnormal   Collection Time: 08/10/18  6:03 PM  Result Value Ref Range   WBC 5.4 4.0 - 10.5 K/uL   RBC 3.38 (L) 3.87 - 5.11 MIL/uL   Hemoglobin 8.5 (L) 12.0 - 15.0 g/dL   HCT 40.7 (L) 68.0 - 88.1 %   MCV 80.5 80.0 - 100.0 fL   MCH 25.1 (L) 26.0 - 34.0 pg   MCHC 31.3 30.0 - 36.0 g/dL   RDW 10.3 (H) 15.9 - 45.8 %   Platelets 114 (L) 150 - 400 K/uL    Comment: REPEATED TO VERIFY PLATELET COUNT CONFIRMED BY SMEAR SPECIMEN CHECKED FOR CLOTS    nRBC 0.0 0.0 - 0.2 %   Neutrophils Relative % 68 %   Neutro Abs 3.7 1.7 - 7.7 K/uL   Lymphocytes Relative 18 %   Lymphs Abs 1.0 0.7 - 4.0 K/uL   Monocytes Relative 12 %   Monocytes Absolute 0.6 0.1 - 1.0 K/uL   Eosinophils Relative 1 %   Eosinophils Absolute 0.1 0.0 - 0.5 K/uL   Basophils Relative 1 %   Basophils Absolute 0.1 0.0 - 0.1 K/uL   RBC Morphology FEW TARGET CELLS NOTED    Immature Granulocytes 0 %   Abs Immature Granulocytes 0.02 0.00 - 0.07 K/uL    Comment: Performed at [facility], [address] [place], [place] [address]  Lactic acid, plasma     Status: Abnormal   Collection Time: 08/10/18  6:03 PM  Result Value Ref Range   Lactic Acid, Venous 2.4 (HH) 0.5 - 1.9 mmol/L    Comment: CRITICAL RESULT CALLED TO, READ BACK BY AND VERIFIED WITH: MCKEOWN,A RN 08/10/2018 1913 JORDANS Performed at [facility], [address], [place], [place] [address]   Protime-INR     Status: Abnormal   Collection Time: 08/10/18  6:03 PM  Result Value Ref Range   Prothrombin Time 19.5 (H) 11.4 - 15.2 seconds   INR 1.7 (H) 0.8 - 1.2    Comment: (NOTE) INR goal varies based on device and disease states. Performed at [facility]  Lab, [address] [place],  [place] [id number]   Ammonia     Status: Abnormal   Collection Time: 08/10/18  6:05 PM  Result Value Ref Range   Ammonia 66 (H) 9 - 35 umol/L    Comment: Performed at [facility], [address] [place], [place] [address]  Urinalysis, Routine w reflex microscopic     Status: Abnormal   Collection Time: 08/10/18  6:40 PM  Result Value Ref Range   Color, Urine AMBER (A) YELLOW    Comment: BIOCHEMICALS MAY BE AFFECTED BY COLOR   APPearance CLOUDY (A) CLEAR   Specific Gravity, Urine 1.015 1.005 - 1.030   pH 5.0 5.0 - 8.0   Glucose, UA NEGATIVE NEGATIVE mg/dL   Hgb urine dipstick SMALL (A) NEGATIVE   Bilirubin Urine NEGATIVE NEGATIVE   Ketones, ur NEGATIVE NEGATIVE mg/dL   Protein, ur NEGATIVE NEGATIVE mg/dL   Nitrite NEGATIVE NEGATIVE   Leukocytes,Ua MODERATE (A) NEGATIVE   RBC / HPF 0-5 0 - 5 RBC/hpf   WBC, UA >50 (H) 0 - 5 WBC/hpf   Bacteria, UA MANY (A) NONE SEEN   Squamous Epithelial / LPF 0-5 0 - 5   WBC Clumps PRESENT    Mucus PRESENT    Hyaline Casts, UA PRESENT     Comment: Performed at [facility], [address] [place], [place] [address]   Dg Chest Portable 1 View  Result Date: 08/10/2018 CLINICAL DATA:  Shortness of breath.  Abdominal pain. EXAM: PORTABLE CHEST 1 VIEW COMPARISON:  July 23, 2018 FINDINGS: Heart size remains enlarged. There are progressive interstitial lung markings. There is a new dense retrocardiac opacity. Hazy airspace opacities are noted bilaterally. There may be small bilateral pleural effusions. There is no acute osseous abnormality. No pneumothorax. IMPRESSION: 1. Cardiomegaly with findings suggestive of worsening volume overload. 2. Worsening retrocardiac opacity which may represent atelectasis or infiltrate. 3. Probable trace to small bilateral pleural effusions. Electronically Signed   By: Katherine Mantlehristopher  Green M.D.   On: 08/10/2018 19:30    Pending Labs Unresulted Labs (From admission, onward)    Start     Ordered   08/10/18 1848  Lactate  dehydrogenase (pleural or peritoneal fluid)  (Peritoneal fluid analysis panel (pnl))  ONCE - STAT,   STAT     08/10/18 1847   08/10/18 1848  Glucose, pleural or peritoneal fluid  (Peritoneal fluid analysis panel (pnl))  ONCE - STAT,   STAT     08/10/18 1847   08/10/18 1848  Protein, pleural or peritoneal fluid  (Peritoneal fluid analysis panel (pnl))  ONCE - STAT,   STAT     08/10/18 1847   08/10/18 1848  Albumin, pleural or peritoneal fluid  (Peritoneal fluid analysis panel (pnl))  ONCE - STAT,   STAT     08/10/18 1847   08/10/18 1848  Lactate dehydrogenase  ONCE - STAT,   STAT     08/10/18 1847   08/10/18 1805  Blood culture (routine x 2)  BLOOD CULTURE X 2,   STAT     08/10/18 1804          Vitals/Pain Today's Vitals   08/10/18 1900 08/10/18 1915 08/10/18 2000 08/10/18 2100  BP: (!) 101/54 (!) 94/54 (!) 104/59 (!) 101/56  Pulse: 81 83 82 85  Resp: 11 13 14 12   SpO2: 100% 100% 100% 100%  PainSc:        Isolation Precautions No active isolations  Medications Medications  lactated ringers bolus 1,000 mL (1,000 mLs Intravenous New Bag/Given 08/10/18 2028)  lidocaine (PF) (XYLOCAINE) 1 % injection 5 mL (5 mLs Other Given 08/10/18 1953)    Mobility walks with device High fall risk   Focused Assessments Cardiac Assessment Handoff:  Cardiac Rhythm: Normal sinus rhythm Lab Results  Component Value Date   CKTOTAL 623 (H) 06/14/2018   TROPONINI <0.03 07/24/2018   Lab Results  Component Value Date   DDIMER 3.33 (H) 07/01/2018   Does the Patient currently have chest pain? No     R Recommendations: See Admitting Provider Note  Report given to:   Additional Notes:  Pt has yeast? On legs, perineum. Needs nursing home placement?

## 2018-08-10 NOTE — ED Provider Notes (Addendum)
[facility] EM[facility]GENCY [facility]PARTMENT Provider Note   CSN: [id number] Arrival date & time: 08/10/18  1455    History   Chief Complaint Chief Complaint  Patient presents with   Tailbone Pain    HPI Anne ApplebaumCarla S Black is a 58 y.o. female.     The history is provided by the patient and medical records.  Illness Location:  Generalized  Quality:  Confusion, lower back pain, abdominal pain Severity:  Moderate Onset quality:  Unable to specify Timing:  Constant Progression:  Unable to specify Chronicity:  New Associated symptoms: abdominal pain, fatigue, nausea and shortness of breath   Associated symptoms: no chest pain, no cough, no fever, no headaches and no wheezing    Patient is a 58 year old female with PMHx as below and frequent ED visits for numerous complaints (20 visits to the ED in June 2020 alone), who presents to the ED today by EMS from home for reports of "butt pain." She denies any falls or other recent trauma. EMS reports that there was feces all over the floor in the home.  At the time of my examination, she is repeatedly asking about being placed into a rehab facility and says that she is unable to care for herself. She denies any fever or chills, she does report shortness of breath, abdominal pain and abdominal distention.  She endorses a history of ascites with prior paracentesis.   Past Medical History:  Diagnosis Date   ADHD (attention deficit hyperactivity disorder)    Alcoholic cirrhosis (HCC)    with ascites   Alcoholism (HCC)    Allergies    Anxiety    Arthritis    Atrial fibrillation with RVR (HCC)    Bipolar disorder (HCC)    COPD (chronic obstructive pulmonary disease) (HCC)    wears 2L chronic O2   Dyspnea    EtOH dependence (HCC) 05/14/2018   GERD (gastroesophageal reflux disease)    History of hiatal hernia    Hypokalemia    Migraine    Multiple sclerosis (HCC)    Narcolepsy    Osteoarthritis of knee     Osteoporosis    Pneumonia     Patient Active Problem List   Diagnosis Date Noted   Cellulitis of lower extremity    Peripheral edema    Atrial fibrillation with RVR (HCC) 06/13/2018   Alcohol dependence (HCC) 05/06/2018   Electrolyte disturbance 05/05/2018   Alcohol withdrawal delirium (HCC) 04/28/2018   Weakness generalized 04/27/2018   Gait abnormality 04/24/2018   Chronic hypoxemic respiratory failure (HCC) 04/11/2018   Atrial fibrillation with rapid ventricular response (HCC) 03/23/2018   Head injury    Fall 03/19/2018   Syncope 03/19/2018   Orbital fracture 03/19/2018   Scalp laceration 03/19/2018   Acute respiratory failure with hypoxia and hypercarbia (HCC) 03/19/2018   Abdominal tenderness 03/19/2018   Acute on chronic respiratory failure with hypoxia (HCC) 03/17/2018   Alcohol abuse with intoxication (HCC) 03/17/2018   ETOH abuse    Normocytic anemia 03/16/2018   Acute metabolic encephalopathy 03/16/2018   Lower extremity edema 03/16/2018   Migraine 03/16/2018   Community acquired pneumonia of left lower lobe of lung (HCC)    Weakness 08/21/2017   COPD exacerbation (HCC) 06/12/2017   Chest pain with moderate risk for cardiac etiology 05/30/2017   Abnormal LFTs 05/30/2017   Tachycardia 05/30/2017   COPD (chronic obstructive pulmonary disease) (HCC) 04/19/2017   Right rib fracture 04/15/2017   Abdominal wall cellulitis 12/30/2016  S/P exploratory laparotomy 12/30/2016   Distal radius fracture, left 09/09/2016   Thrombocytopenia (HCC) 09/09/2016   Chest x-ray abnormality    Umbilical hernia without obstruction and without gangrene 05/12/2016   Itching 02/24/2016   Ventral hernia without obstruction or gangrene 02/24/2016   Palliative care encounter    Ascites    Tachypnea    Atrial flutter with rapid ventricular response (HCC)    Hypomagnesemia    Hypoxia    Dehydration 01/22/2016   Low back ache 12/30/2015     Non-intractable vomiting with nausea    Alcohol abuse with alcohol-induced mood disorder (HCC) 12/15/2015   Acute alcoholism (HCC)    Distended abdomen 11/04/2015   Alcohol abuse    Dyspnea    Acute respiratory failure (HCC) 10/15/2015   Ascites due to alcoholic cirrhosis (HCC) 10/15/2015   Osteoporosis    Multiple sclerosis (HCC)    Alcoholic cirrhosis of liver with ascites (HCC) 08/22/2015   Bipolar disorder, current episode mixed, moderate (HCC) 08/01/2015   Alcohol use disorder, severe, dependence (HCC) 07/31/2015   Macrocytic anemia- due to alcohol abuse with normal B12 & folate levels 05/31/2015   Severe protein-calorie malnutrition (HCC) 05/31/2015   Hypokalemia 05/26/2015   Encephalopathy, hepatic (HCC) 05/26/2015   Alcohol withdrawal (HCC) 05/18/2015   Abdominal pain 05/18/2015   Anxiety 05/18/2015   Stimulant abuse (HCC) 12/27/2014   Nicotine abuse 12/27/2014   Hyperprolactinemia (HCC) 11/15/2014   Dyslipidemia    Alcohol use disorder, moderate, dependence (HCC) 10/30/2014   Tobacco abuse 01/23/2014   Bipolar 1 disorder (HCC) 04/07/2013   Noncompliance with therapeutic plan 04/04/2013   Hereditary and idiopathic peripheral neuropathy 05/01/2012   Depression 05/01/2012   GERD (gastroesophageal reflux disease) 05/01/2012   Osteoarthrosis, unspecified whether generalized or localized, involving lower leg 05/01/2012   Hyponatremia 07/01/2011    Past Surgical History:  Procedure Laterality Date   ABDOMINAL WALL DEFECT REPAIR N/A 12/30/2016   Procedure: EXPLORATORY LAPAROTOMY WITH REPAIR ABDOMINAL WALL VENTRAL HERNIA;  Surgeon: [name], MD;  Location: [facility] OR;  Service: General;  Laterality: N/A;   APPLICATION OF WOUND VAC N/A 12/30/2016   Procedure: APPLICATION OF WOUND VAC;  Surgeon: [name], MD;  Location: [facility] OR;  Service: General;  Laterality: N/A;   CESAREAN SECTION  [phone]   FRACTURE SURGERY      HERNIA REPAIR     IR PARACENTESIS  06/14/2018   MYRINGOTOMY WITH TUBE PLACEMENT Bilateral    ORIF WRIST FRACTURE Left 09/09/2016   Procedure: OPEN REDUCTION INTERNAL FIXATION (ORIF) LEFT WRIST FRACTURE, LEFT CARPAL TUNNEL RELEASE;  Surgeon: Dominica Severin, MD;  Location: [facility] ORS;  Service: Orthopedics;  Laterality: Left;   TONSILLECTOMY       OB History   No obstetric history on file.      Home Medications    Prior to Admission medications   Medication Sig Start Date End Date Taking? Authorizing Provider  acetaminophen (TYLENOL) 500 MG tablet Take 1 tablet (500 mg total) by mouth every 6 (six) hours as needed for mild pain. Patient taking differently: Take 1,000 mg by mouth every 6 (six) hours as needed for mild pain.  03/28/18   Rolly Salter, MD  diltiazem (CARDIZEM CD) 120 MG 24 hr capsule Take 1 capsule (120 mg total) by mouth daily for 30 days. 04/14/18 08/05/18  Tegeler, Canary Brim, MD  famotidine (PEPCID) 20 MG tablet Take 1 tablet (20 mg total) by mouth daily for 14 days. 07/24/18 08/07/18  Caccavale, Sophia, PA-C  furosemide (  LASIX) 40 MG tablet Take 1 tablet (40 mg total) by mouth daily. 06/27/18   Sabas SousBero, Michael M, MD  gabapentin (NEURONTIN) 100 MG capsule Take 2 capsules (200 mg total) by mouth 3 (three) times daily for 30 days. 07/19/18 08/18/18  Fayrene Helperran, Bowie, PA-C  hydrOXYzine (ATARAX/VISTARIL) 25 MG tablet Take 1 tablet (25 mg total) by mouth daily as needed for up to 30 doses (anxiety). 04/14/18   Tegeler, Canary Brimhristopher J, MD  methocarbamol (ROBAXIN) 500 MG tablet Take 1 tablet (500 mg total) by mouth 2 (two) times daily as needed for muscle spasms. 06/27/18   Sabas SousBero, Michael M, MD  ondansetron (ZOFRAN) 4 MG tablet Take 4 mg by mouth every 8 (eight) hours as needed for nausea or vomiting.    [provider]  ondansetron (ZOFRAN) 4 MG tablet Take 1 tablet (4 mg total) by mouth every 6 (six) hours. 08/05/18   Lawyer, Cristal Deerhristopher, PA-C  pantoprazole (PROTONIX) 20 MG tablet Take 2  tablets (40 mg total) by mouth daily. 07/29/18   Mesner, Barbara CowerJason, MD  pantoprazole (PROTONIX) 20 MG tablet Take 1 tablet (20 mg total) by mouth daily. 08/05/18   Lawyer, Cristal Deerhristopher, PA-C  potassium chloride SA (K-DUR,KLOR-CON) 20 MEQ tablet Take 2 tablets (40 mEq total) by mouth 2 (two) times daily. 04/19/18   Bethel BornGekas, Kelly Marie, PA-C  spironolactone (ALDACTONE) 25 MG tablet Take 1 tablet (25 mg total) by mouth daily for 30 days. 06/27/18 08/10/18  Sabas SousBero, Michael M, MD    Family History Family History  Problem Relation Age of Onset   Arrhythmia Mother    Neuropathy Mother    Coronary artery disease Mother    Heart disease Mother    Heart disease Father    Hypertension Father    Heart attack Father    Multiple sclerosis Maternal Aunt     Social History Social History   Tobacco Use   Smoking status: Current Every Day Smoker    Packs/day: 1.00    Years: 36.00    Pack years: 36.00    Types: Cigarettes   Smokeless tobacco: Never Used  Substance Use Topics   Alcohol use: Yes    Comment: Chronic ETOH abuse   Drug use: No     Allergies   Patient has no known allergies.   Review of Systems Review of Systems  Constitutional: Positive for activity change, chills and fatigue. Negative for fever.  Eyes: Negative for visual disturbance.  Respiratory: Positive for shortness of breath. Negative for cough and wheezing.   Cardiovascular: Negative for chest pain and palpitations.  Gastrointestinal: Positive for abdominal pain and nausea.  Genitourinary: Negative for difficulty urinating.  Neurological: Positive for light-headedness. Negative for dizziness and headaches.  Psychiatric/Behavioral: Negative for confusion.  All other systems reviewed and are negative.    Physical Exam Updated Vital Signs BP (!) 101/56    Pulse 85    Resp 12    SpO2 100%   Physical Exam Vitals signs and nursing note reviewed.  Constitutional:      Appearance: Normal appearance. She is obese.  She is ill-appearing.  HENT:     Head: Normocephalic and atraumatic.     Right Ear: External ear normal.     Left Ear: External ear normal.     Nose: Nose normal. No congestion.     Mouth/Throat:     Mouth: Mucous membranes are moist.  Eyes:     Pupils: Pupils are equal, round, and reactive to light.  Neck:  Musculoskeletal: Normal range of motion.  Cardiovascular:     Rate and Rhythm: Normal rate.     Pulses: Normal pulses.  Pulmonary:     Breath sounds: Wheezing present.  Abdominal:     General: Abdomen is protuberant. There is distension.     Palpations: There is shifting dullness and fluid wave.     Tenderness: There is generalized abdominal tenderness.  Skin:    General: Skin is warm.  Neurological:     Mental Status: She is alert. She is disoriented.      ED Treatments / Results  Labs (all labs ordered are listed, but only abnormal results are displayed) Labs Reviewed  COMPREHENSIVE METABOLIC PANEL - Abnormal; Notable for the following components:      Result Value   Sodium 123 (*)    Chloride 85 (*)    Glucose, Bld 101 (*)    Creatinine, Ser 1.05 (*)    Calcium 7.6 (*)    Total Protein 5.9 (*)    Albumin 2.1 (*)    AST 49 (*)    Alkaline Phosphatase 151 (*)    Total Bilirubin 3.0 (*)    GFR calc non Af Amer 58 (*)    All other components within normal limits  CBC WITH DIFFERENTIAL/PLATELET - Abnormal; Notable for the following components:   RBC 3.38 (*)    Hemoglobin 8.5 (*)    HCT 27.2 (*)    MCH 25.1 (*)    RDW 21.3 (*)    Platelets 114 (*)    All other components within normal limits  URINALYSIS, ROUTINE W REFLEX MICROSCOPIC - Abnormal; Notable for the following components:   Color, Urine AMBER (*)    APPearance CLOUDY (*)    Hgb urine dipstick SMALL (*)    Leukocytes,Ua MODERATE (*)    WBC, UA >50 (*)    Bacteria, UA MANY (*)    All other components within normal limits  LACTIC ACID, PLASMA - Abnormal; Notable for the following components:    Lactic Acid, Venous 2.4 (*)    All other components within normal limits  PROTIME-INR - Abnormal; Notable for the following components:   Prothrombin Time 19.5 (*)    INR 1.7 (*)    All other components within normal limits  AMMONIA - Abnormal; Notable for the following components:   Ammonia 66 (*)    All other components within normal limits  CULTURE, BLOOD (ROUTINE X 2)  CULTURE, BLOOD (ROUTINE X 2)  CULTURE, BODY FLUID-BOTTLE  GRAM STAIN  SARS CORONAVIRUS 2 (HOSPITAL ORDER, PERFORMED IN [facility])  LIPASE, BLOOD  LACTATE DEHYDROGENASE, PLEURAL OR PERITONEAL FLUID  GLUCOSE, PLEURAL OR PERITONEAL FLUID  PROTEIN, PLEURAL OR PERITONEAL FLUID  ALBUMIN, PLEURAL OR PERITONEAL FLUID  LACTATE DEHYDROGENASE  I-STAT BETA HCG BLOOD, ED ([facility], [facility], [facility] ONLY)    EKG EKG Interpretation  Date/Time:  Thursday August 10 2018 18:16:02 EDT Ventricular Rate:  84 PR Interval:    QRS Duration: 98 QT Interval:  383 QTC Calculation: 453 R Axis:   87 Text Interpretation:  Sinus rhythm Low voltage, precordial leads Borderline T abnormalities, anterior leads Abnormal ECG Confirmed by Gerhard Munch [phone]) on 08/10/2018 6:29:00 PM   Radiology Dg Chest Portable 1 View  Result Date: 08/10/2018 CLINICAL DATA:  Shortness of breath.  Abdominal pain. EXAM: PORTABLE CHEST 1 VIEW COMPARISON:  July 23, 2018 FINDINGS: Heart size remains enlarged. There are progressive interstitial lung markings. There is a new dense retrocardiac opacity. Hazy  airspace opacities are noted bilaterally. There may be small bilateral pleural effusions. There is no acute osseous abnormality. No pneumothorax. IMPRESSION: 1. Cardiomegaly with findings suggestive of worsening volume overload. 2. Worsening retrocardiac opacity which may represent atelectasis or infiltrate. 3. Probable trace to small bilateral pleural effusions. Electronically Signed   By: Katherine Mantlehristopher  Green M.D.   On: 08/10/2018 19:30     Procedures .Paracentesis  Date/Time: 08/10/2018 7:00 PM Performed by: Saverio DankerWilliams, Moselle Rister A, MD Authorized by: Gerhard MunchLockwood, Robert, MD   Consent:    Consent obtained:  Verbal   Consent given by:  Patient   Risks discussed:  Infection, bowel perforation, bleeding and pain   Alternatives discussed:  No treatment, delayed treatment, alternative treatment, observation and referral Pre-procedure details:    Procedure purpose:  Diagnostic   Preparation: Patient was prepped and draped in usual sterile fashion   Anesthesia (see MAR for exact dosages):    Anesthesia method:  Local infiltration   Local anesthetic:  Lidocaine 1% w/o epi Procedure details:    Needle gauge:  22   Ultrasound guidance: yes     Puncture site:  R lower quadrant   Fluid removed amount:  100mL   Fluid appearance:  Yellow and cloudy   Dressing:  Adhesive bandage Post-procedure details:    Patient tolerance of procedure:  Tolerated well, no immediate complications   (including critical care time)  Medications Ordered in ED Medications  ceFEPIme (MAXIPIME) 2 g in sodium chloride 0.9 % 100 mL IVPB (has no administration in time range)  vancomycin (VANCOCIN) 1,293 mg in sodium chloride 0.9 % 500 mL IVPB (has no administration in time range)  lactated ringers bolus 1,000 mL (1,000 mLs Intravenous New Bag/Given 08/10/18 2028)  lidocaine (PF) (XYLOCAINE) 1 % injection 5 mL (5 mLs Other Given 08/10/18 1953)     Initial Impression / Assessment and Plan / ED Course  I have reviewed the triage vital signs and the nursing notes.  Pertinent labs & imaging results that were available during my care of the patient were reviewed by me and considered in my medical decision making (see chart for details).  Of note, this patient was evaluated in the Emergency Department for the symptoms described in the history of present illness. She was evaluated in the context of the global COVID-19 pandemic, which necessitated consideration that  the patient might be at risk for infection with the SARS-CoV-2 virus that causes COVID-19. Institutional protocols and algorithms that pertain to the evaluation of patients at risk for COVID-19 are in a state of rapid change based on information released by regulatory bodies including the CDC and federal and state organizations. These policies and algorithms were followed during the patient's care in the ED.  During this patient encounter, the patient was wearing a mask, and throughout this encounter I was wearing at least a surgical mask.  I was not within 6 feet of this patient for more than 15 minutes without eye protection when they were not wearing mask.        Patient is a 58 year old female with past medical history significant for alcoholic cirrhosis with varices and chronic ascites, bipolar disorder, COPD on 2-3L home oxygen who presented to the ED today via EMS for initial report of "butt pain" and was apparently found in her home surrounded in feces and the house in disarray.  At the time of my examination of her, she is perseverating about being here to be placed into a rehab facility as  she says she cannot care for herself.  She was found mildly hypothermic with a rectal temperature of 96.2, she was mildly hypotensive with a systolic blood pressure of 88 and initial heart rate of 110.  She appeared chronically ill though does not appear to be in acute respiratory distress and is on her home oxygen settings.  Her examination was significant for grossly distended abdomen that was generally tender to palpation, diminished lung sounds throughout with mild rhonchi and 3+ pitting edema in the bilateral lower extremities with the appearance of chronic stasis dermatosis.   As seen above, her laboratory work-up was significant for lactic acidosis 2.4, hyponatremia with sodium 123 (which appears to be less than her usual baseline hyponatremia in the 128-130 range), lipase normal, chronically elevated LFTs  are seen.  Ammonia level appears stable at 66 compared to previous.  In and out urinary cath specimen with moderate leukocytes, many bacteria.  Her chest x-ray was significant for signs of volume overload and possible retrocardiac opacity.   I performed a diagnostic paracentesis, results are pending.  She was given 1 L of IV fluid bolus secondary to SIRS with hemodynamic abnormalities, though did not give a full 12mL/kg bolus due to evidence of gross volume overload state and hypoalbuminemia. IV Vanc and Cefepime given and she was admitted to the Hospitalist service for continued inpatient care and management.   The care of this patient was supervised by Dr. Carmin Muskrat, who agreed with the plan and management of the patient.   Final Clinical Impressions(s) / ED Diagnoses   Final diagnoses:  Ascites    ED Discharge Orders    None       Jefm Petty, MD 08/11/18 1224    Jefm Petty, MD 08/11/18 1229    Carmin Muskrat, MD 08/14/18 [phone]

## 2018-08-10 NOTE — H&P (Signed)
History and Physical    Anne Black VEH:[id number] DOB: 1960-10-24 DOA: 08/10/2018  PCP: Patient, No Pcp Per Patient coming from: Home  Chief Complaint: None  HPI: Anne Black is a 58 y.o. female with medical history significant of alcoholic cirrhosis with varices and chronic ascites, COPD, bipolar disorder, MS, paroxysmal atrial fibrillation not anticoagulated, smoking, chronic respiratory failure on 2-3 L home oxygen presenting to the hospital via EMS.  Patient is not sure why she is here.  She lives alone and is not sure who called EMS.  Denies having any fevers or chills.  States she has chronic shortness of breath and uses 2 to 3 L home oxygen, no recent change.  Denies any chest pain.  States her abdomen appears distended.  Denies any dysuria.  States she does not take lactulose because it makes her have frequent bowel movements.  States she takes Lasix and spironolactone at home.  No additional history could be obtained from her.  ED Course: Per ED provider, patient was hypotensive on arrival with systolic in the 83M.  Heart rate in the 110s.  Hypothermic with rectal temperature 96.2 F.  No leukocytosis.  Lactic acid 2.4.  Sodium 123, chronically low.  Sodium was ranging between 124-130 last month.  BUN 7.  Creatinine 1.0, baseline 0.3-0.4.  Lipase normal.  AST 49, alk phos 151, T bili 3.0.  LFTs slightly improved in comparison to labs done on July 25, 2018. INR 1.7, was 1.5 a month ago.  Ammonia level 66.  Blood culture x2 pending.  UA with moderate amount of leukocytes, greater than 50 WBCs, and many bacteria.  COVID-19 rapid test pending. Chest x-ray showing cardiomegaly with findings suggestive of worsening volume overload.  Also showing worsening retrocardiac opacity which may represent atelectasis or infiltrate.  Probable trace to small bilateral pleural effusions. Underwent diagnostic paracentesis in the ED. 100 cc peritoneal fluid removed.  Gram stain showing no organisms.   Peritoneal fluid culture pending. Patient received vancomycin, cefepime, and 1 L fluid bolus for sepsis secondary to UTI.  Review of Systems:  All systems reviewed and apart from history of presenting illness, are negative.  Past Medical History:  Diagnosis Date  . ADHD (attention deficit hyperactivity disorder)   . Alcoholic cirrhosis ([place])    with ascites  . Alcoholism ([place])   . Allergies   . Anxiety   . Arthritis   . Atrial fibrillation with RVR ([place])   . Bipolar disorder ([place])   . COPD (chronic obstructive pulmonary disease) (H[facility])    wears 2L chronic O2  . Dyspnea   . EtOH dependence ([place]) 05/14/2018  . GERD (gastroesophageal reflux disease)   . History of hiatal hernia   . Hypokalemia   . Migraine   . Multiple sclerosis ([place])   . Narcolepsy   . Osteoarthritis of knee   . Osteoporosis   . Pneumonia     Past Surgical History:  Procedure Laterality Date  . ABDOMINAL WALL DEFECT REPAIR N/A 12/30/2016   Procedure: EXPLORATORY LAPAROTOMY WITH REPAIR ABDOMINAL WALL VENTRAL HERNIA;  Surgeon: Rolm Bookbinder, MD;  Location: [facility];  Service: General;  Laterality: N/A;  . APPLICATION OF WOUND VAC N/A 12/30/2016   Procedure: APPLICATION OF WOUND VAC;  Surgeon: Rolm Bookbinder, MD;  Location: [facility];  Service: General;  Laterality: N/A;  . CESAREAN SECTION  [phone]  . FRACTURE SURGERY    . HERNIA REPAIR    . IR PARACENTESIS  06/14/2018  . MYRINGOTOMY WITH TUBE  PLACEMENT Bilateral   . ORIF WRIST FRACTURE Left 09/09/2016   Procedure: OPEN REDUCTION INTERNAL FIXATION (ORIF) LEFT WRIST FRACTURE, LEFT CARPAL TUNNEL RELEASE;  Surgeon: Roseanne Kaufman, MD;  Location: [facility];  Service: Orthopedics;  Laterality: Left;  . TONSILLECTOMY       reports that she has been smoking cigarettes. She has a 36.00 pack-year smoking history. She has never used smokeless tobacco. She reports current alcohol use. She reports that she does not use drugs.  No Known Allergies  Family History   Problem Relation Age of Onset  . Arrhythmia Mother   . Neuropathy Mother   . Coronary artery disease Mother   . Heart disease Mother   . Heart disease Father   . Hypertension Father   . Heart attack Father   . Multiple sclerosis Maternal Aunt     Prior to Admission medications   Medication Sig Start Date End Date Taking? Authorizing Provider  acetaminophen (TYLENOL) 500 MG tablet Take 1 tablet (500 mg total) by mouth every 6 (six) hours as needed for mild pain. Patient taking differently: Take 1,000 mg by mouth every 6 (six) hours as needed for mild pain.  03/28/18   Lavina Hamman, MD  diltiazem (CARDIZEM CD) 120 MG 24 hr capsule Take 1 capsule (120 mg total) by mouth daily for 30 days. 04/14/18 08/05/18  Tegeler, Gwenyth Allegra, MD  famotidine (PEPCID) 20 MG tablet Take 1 tablet (20 mg total) by mouth daily for 14 days. 07/24/18 08/07/18  Caccavale, Sophia, PA-C  furosemide (LASIX) 40 MG tablet Take 1 tablet (40 mg total) by mouth daily. 06/27/18   Maudie Flakes, MD  gabapentin (NEURONTIN) 100 MG capsule Take 2 capsules (200 mg total) by mouth 3 (three) times daily for 30 days. 07/19/18 08/18/18  Domenic Moras, PA-C  hydrOXYzine (ATARAX/VISTARIL) 25 MG tablet Take 1 tablet (25 mg total) by mouth daily as needed for up to 30 doses (anxiety). 04/14/18   Tegeler, Gwenyth Allegra, MD  methocarbamol (ROBAXIN) 500 MG tablet Take 1 tablet (500 mg total) by mouth 2 (two) times daily as needed for muscle spasms. 06/27/18   Maudie Flakes, MD  ondansetron (ZOFRAN) 4 MG tablet Take 4 mg by mouth every 8 (eight) hours as needed for nausea or vomiting.    [provider]  ondansetron (ZOFRAN) 4 MG tablet Take 1 tablet (4 mg total) by mouth every 6 (six) hours. 08/05/18   Lawyer, Harrell Gave, PA-C  pantoprazole (PROTONIX) 20 MG tablet Take 2 tablets (40 mg total) by mouth daily. 07/29/18   Mesner, Corene Cornea, MD  pantoprazole (PROTONIX) 20 MG tablet Take 1 tablet (20 mg total) by mouth daily. 08/05/18   Lawyer,  Harrell Gave, PA-C  potassium chloride SA (K-DUR,KLOR-CON) 20 MEQ tablet Take 2 tablets (40 mEq total) by mouth 2 (two) times daily. 04/19/18   Recardo Evangelist, PA-C  spironolactone (ALDACTONE) 25 MG tablet Take 1 tablet (25 mg total) by mouth daily for 30 days. 06/27/18 08/10/18  Maudie Flakes, MD    Physical Exam: Vitals:   08/10/18 2100 08/10/18 2130 08/10/18 2300 08/10/18 2357  BP: (!) 101/56 105/61  (!) 105/52  Pulse: 85 82  87  Resp: '12 17  20  ' Temp:    97.7 F (36.5 C)  TempSrc:    Oral  SpO2: 100% 100%  97%  Weight:   92.7 kg   Height:   '5\' 2"'  (1.575 m)     Physical Exam  Constitutional: She appears well-developed and  well-nourished. No distress.  HENT:  Head: Normocephalic.  Dry mucous membranes  Eyes: Right eye exhibits no discharge. Left eye exhibits no discharge.  Neck: Neck supple. No JVD present.  Cardiovascular: Normal rate, regular rhythm and intact distal pulses.  Pulmonary/Chest: Effort normal and breath sounds normal. No respiratory distress. She has no wheezes. She has no rales.  Abdominal: Soft. Bowel sounds are normal. She exhibits distension. There is no abdominal tenderness. There is no rebound and no guarding.  Musculoskeletal:        General: Edema present.     Comments: +3 pitting edema of bilateral lower extremities Spine and coccyx nontender to palpation  Neurological:  Awake and alert Oriented to person and place.  Knows it is 2020 but not the exact date. Moving all extremities spontaneously.  Skin: Skin is warm and dry. She is not diaphoretic.  Bilateral lower extremities mildly erythematous and warm to touch Skin breakdown noted in the buttock region.  No drainage.     Labs on Admission: I have personally reviewed following labs and imaging studies  CBC: Recent Labs  Lab 08/10/18 1803  WBC 5.4  NEUTROABS 3.7  HGB 8.5*  HCT 27.2*  MCV 80.5  PLT 974*   Basic Metabolic Panel: Recent Labs  Lab 08/10/18 1803  NA 123*  K 4.3  CL  85*  CO2 27  GLUCOSE 101*  BUN 7  CREATININE 1.05*  CALCIUM 7.6*   GFR: Estimated Creatinine Clearance: 61.9 mL/min (A) (by C-G formula based on SCr of 1.05 mg/dL (H)). Liver Function Tests: Recent Labs  Lab 08/10/18 1803  AST 49*  ALT 25  ALKPHOS 151*  BILITOT 3.0*  PROT 5.9*  ALBUMIN 2.1*   Recent Labs  Lab 08/10/18 1803  LIPASE 35   Recent Labs  Lab 08/10/18 1805  AMMONIA 66*   Coagulation Profile: Recent Labs  Lab 08/10/18 1803  INR 1.7*   Cardiac Enzymes: No results for input(s): CKTOTAL, CKMB, CKMBINDEX, TROPONINI in the last 168 hours. BNP (last 3 results) No results for input(s): PROBNP in the last 8760 hours. HbA1C: No results for input(s): HGBA1C in the last 72 hours. CBG: No results for input(s): GLUCAP in the last 168 hours. Lipid Profile: No results for input(s): CHOL, HDL, LDLCALC, TRIG, CHOLHDL, LDLDIRECT in the last 72 hours. Thyroid Function Tests: No results for input(s): TSH, T4TOTAL, FREET4, T3FREE, THYROIDAB in the last 72 hours. Anemia Panel: No results for input(s): VITAMINB12, FOLATE, FERRITIN, TIBC, IRON, RETICCTPCT in the last 72 hours. Urine analysis:    Component Value Date/Time   COLORURINE AMBER (A) 08/10/2018 1840   APPEARANCEUR CLOUDY (A) 08/10/2018 1840   LABSPEC 1.015 08/10/2018 1840   PHURINE 5.0 08/10/2018 1840   GLUCOSEU NEGATIVE 08/10/2018 1840   HGBUR SMALL (A) 08/10/2018 1840   BILIRUBINUR NEGATIVE 08/10/2018 1840   BILIRUBINUR moderate (A) 06/19/2015 1906   KETONESUR NEGATIVE 08/10/2018 1840   PROTEINUR NEGATIVE 08/10/2018 1840   UROBILINOGEN >=8.0 06/19/2015 1906   UROBILINOGEN 0.2 09/14/2011 2141   NITRITE NEGATIVE 08/10/2018 1840   LEUKOCYTESUR MODERATE (A) 08/10/2018 1840    Radiological Exams on Admission: Dg Chest Portable 1 View  Result Date: 08/10/2018 CLINICAL DATA:  Shortness of breath.  Abdominal pain. EXAM: PORTABLE CHEST 1 VIEW COMPARISON:  July 23, 2018 FINDINGS: Heart size remains enlarged.  There are progressive interstitial lung markings. There is a new dense retrocardiac opacity. Hazy airspace opacities are noted bilaterally. There may be small bilateral pleural effusions. There is no acute osseous  abnormality. No pneumothorax. IMPRESSION: 1. Cardiomegaly with findings suggestive of worsening volume overload. 2. Worsening retrocardiac opacity which may represent atelectasis or infiltrate. 3. Probable trace to small bilateral pleural effusions. Electronically Signed   By: Constance Holster M.D.   On: 08/10/2018 19:30    EKG: Independently reviewed.  Sinus rhythm, no acute ischemic changes.  Assessment/Plan Principal Problem:   UTI (urinary tract infection) Active Problems:   Acute hepatic encephalopathy   Alcoholic cirrhosis of liver with ascites (HCC)   Sepsis ([facility])   AKI (acute kidney injury) ([place])   Sepsis secondary to UTI, possible pneumonia, possible bilateral lower extremity cellulitis Hypothermic, hypotensive, and tachycardic on arrival.  Lactic acid 2.4.  No leukocytosis.  UA suggestive of infection.  Chest x-ray with retrocardiac opacity concerning for possible infiltrate.  Noted to have mild bilateral lower extremity erythema and warmth to touch on exam. -Received 1 L IV fluid bolus and blood pressure now improved -Continue broad-spectrum antibiotics including vancomycin and cefepime at this time -Urine culture pending -Blood culture x2 pending  Acute on chronic hyponatremia Sodium 123, chronically low in the setting of liver cirrhosis.  Sodium was ranging between 124-130 last month.   -Received IV fluid for hypotension and sepsis.  Unable to diurese at this time. -Continue to monitor sodium level  AKI Creatinine 1.0, baseline 0.3-0.4.  Prerenal from sepsis/hypotension versus cardiorenal/ hepatorenal. -Received IV fluid for hypotension and sepsis -Repeat BMP in a.m. -Avoid nephrotoxic agents -Monitor urine output  Decompensated alcoholic cirrhosis with  ascites Underwent diagnostic paracentesis in the ED and only 100 cc peritoneal fluid removed.  Gram stain showing no organisms. -Peritoneal fluid culture pending -Abdomen appears distended.  Consult IR in a.m. for therapeutic paracentesis.  -Antibiotics for SBP prophylaxis -Hold Lasix/spironolactone in the setting of sepsis/hypotension  Hepatic encephalopathy Ammonia level 66.  Appears slightly confused and not completely oriented. -Lactulose  Acutely decompensated diastolic congestive heart failure Currently on 2 L supplemental oxygen, uses 2 to 3 L at home.  Not tachypneic.  Chest x-ray showing cardiomegaly with findings suggestive of worsening volume overload.  Probable trace to small bilateral pleural effusions. Echo done January 2020 showing LVEF 65 to 70% and grade 1 diastolic dysfunction. -Unable to diurese at this time given sepsis/hypotension -She will need diuresis in the morning after blood pressure more stable  Physical deconditioning -PT evaluation  Paroxysmal atrial fibrillation CHA2DS2VASc 2.  Per cardiology documentation, not a candidate for long-term anticoagulation given active alcohol abuse.  Alcohol use disorder No signs of withdrawal at this time. -CIWA protocol; Ativan PRN -Thiamine, folate, multivitamin -Blood ethanol level pending  COPD -Stable.  No wheezing.  DuoNebs PRN.  History of multiple sclerosis Currently not on any medications.  Last neurology visit in March 2020. -Ensure outpatient follow-up  Tobacco use -NicoDerm patch -Counseled to quit  DVT prophylaxis: Subcutaneous heparin Code Status: Full code Family Communication: No family available. Disposition Plan: Anticipate discharge after clinical improvement. Consults called: None Admission status: It is my clinical opinion that admission to INPATIENT is reasonable and necessary in this 58 y.o. female . presenting with sepsis secondary to UTI, possible pneumonia, possible cellulitis of  bilateral lower extremities, acutely decompensated cirrhosis, and acutely decompensated diastolic congestive heart failure.  She will need IV diuresis tomorrow after blood pressure improved.  Needs therapeutic paracentesis.  Given the aforementioned, the predictability of an adverse outcome is felt to be significant. I expect that the patient will require at least 2 midnights in the hospital to treat this condition.  The medical decision making on this patient was of high complexity and the patient is at high risk for clinical deterioration, therefore this is a level 3 visit.  Shela Leff MD [facility] Pager [phone]  If 7PM-7AM, please contact night-coverage [email or web address] Password [email or web address]  08/11/2018, 12:12 AM

## 2018-08-10 NOTE — Care Management (Addendum)
ED CM noted patient to have had 69 ED visits with 10 resulting in hospitalizations, patient has a ED care plan. Patient was last seen at [facility] ED 6/29 c/o of chronic pain. CM noted CSW at [facility] spoke with patient's husband concerning ETOH abuse, it appears family have been trying to get her to treatment but patient has not been compliant with wanting to get the help of treatment.  [facility] services have refused to provide services because patient has been aggressive and threatening with staff. CM will place a CSW consult to follow up with patient and family on a transitional care planning once admitted.

## 2018-08-10 NOTE — ED Notes (Signed)
Attempted to call report; nurse advised in pt's room and unable to take at this time. Asked that I call back in 10 minutes.

## 2018-08-10 NOTE — ED Notes (Signed)
Patient cleaned and linens changed.

## 2018-08-10 NOTE — ED Triage Notes (Signed)
Pt observed to have stool on bottom of bil. Feet and on legs.

## 2018-08-10 NOTE — ED Triage Notes (Addendum)
Pt complains of "butt pain" arrives by EMS from home. Pt intoxicated, hx of alcohol abuse. Pt informed EMS that she wants to go back to a rehab facility. Pt denies any fall- pt was laying on a couch when EMS arrived. EMS reports house is cluttered, feces all over floor, horrible smells. BP 114/54, HR 96, spo2 98%. Pt wears 2L [place] at home

## 2018-08-11 ENCOUNTER — Inpatient Hospital Stay (HOSPITAL_COMMUNITY): Payer: Medicare Other

## 2018-08-11 ENCOUNTER — Encounter (HOSPITAL_COMMUNITY): Payer: Self-pay | Admitting: General Practice

## 2018-08-11 DIAGNOSIS — R319 Hematuria, unspecified: Secondary | ICD-10-CM

## 2018-08-11 DIAGNOSIS — N179 Acute kidney failure, unspecified: Secondary | ICD-10-CM

## 2018-08-11 DIAGNOSIS — A419 Sepsis, unspecified organism: Secondary | ICD-10-CM

## 2018-08-11 DIAGNOSIS — N390 Urinary tract infection, site not specified: Secondary | ICD-10-CM

## 2018-08-11 HISTORY — DX: Urinary tract infection, site not specified: N39.0

## 2018-08-11 LAB — COMPREHENSIVE METABOLIC PANEL
ALT: 22 U/L (ref 0–44)
AST: 46 U/L — ABNORMAL HIGH (ref 15–41)
Albumin: 2 g/dL — ABNORMAL LOW (ref 3.5–5.0)
Alkaline Phosphatase: 146 U/L — ABNORMAL HIGH (ref 38–126)
Anion gap: 7 (ref 5–15)
BUN: 8 mg/dL (ref 6–20)
CO2: 28 mmol/L (ref 22–32)
Calcium: 7.5 mg/dL — ABNORMAL LOW (ref 8.9–10.3)
Chloride: 89 mmol/L — ABNORMAL LOW (ref 98–111)
Creatinine, Ser: 0.86 mg/dL (ref 0.44–1.00)
GFR calc Af Amer: >60 mL/min (ref >60)
GFR calc non Af Amer: >60 mL/min (ref >60)
Glucose, Bld: 98 mg/dL (ref 70–99)
Potassium: 4.2 mmol/L (ref 3.5–5.1)
Sodium: 124 mmol/L — ABNORMAL LOW (ref 135–145)
Total Bilirubin: 2.9 mg/dL — ABNORMAL HIGH (ref 0.3–1.2)
Total Protein: 5.4 g/dL — ABNORMAL LOW (ref 6.5–8.1)

## 2018-08-11 LAB — CBC
HCT: 24.5 % — ABNORMAL LOW (ref 36.0–46.0)
Hemoglobin: 7.6 g/dL — ABNORMAL LOW (ref 12.0–15.0)
MCH: 24.6 pg — ABNORMAL LOW (ref 26.0–34.0)
MCHC: 31 g/dL (ref 30.0–36.0)
MCV: 79.3 fL — ABNORMAL LOW (ref 80.0–100.0)
Platelets: 103 10*3/uL — ABNORMAL LOW (ref 150–400)
RBC: 3.09 MIL/uL — ABNORMAL LOW (ref 3.87–5.11)
RDW: 21.1 % — ABNORMAL HIGH (ref 11.5–15.5)
WBC: 5.1 10*3/uL (ref 4.0–10.5)
nRBC: 0 % (ref 0.0–0.2)

## 2018-08-11 LAB — LACTIC ACID, PLASMA: Lactic Acid, Venous: 2.3 mmol/L (ref 0.5–1.9)

## 2018-08-11 LAB — ETHANOL: Alcohol, Ethyl (B): 118 mg/dL — ABNORMAL HIGH (ref <10)

## 2018-08-11 MED ORDER — SODIUM CHLORIDE 0.9 % IV SOLN
INTRAVENOUS | Status: DC | PRN
  Administered 2018-08-11 – 2018-08-13 (×2): 1000 mL via INTRAVENOUS

## 2018-08-11 MED ORDER — ONDANSETRON HCL 4 MG/2ML IJ SOLN
4.0000 mg | Freq: Four times a day (QID) | INTRAMUSCULAR | Status: DC | PRN
  Administered 2018-08-11 – 2018-08-20 (×13): 4 mg via INTRAVENOUS
  Filled 2018-08-11 (×15): qty 2

## 2018-08-11 MED ORDER — DIPHENHYDRAMINE HCL 25 MG PO CAPS: 25.0000 mg | ORAL_CAPSULE | Freq: Once | ORAL | Status: DC

## 2018-08-11 MED ORDER — HEPARIN SODIUM (PORCINE) 5000 UNIT/ML IJ SOLN
5000.0000 [IU] | Freq: Three times a day (TID) | INTRAMUSCULAR | Status: DC
  Administered 2018-08-11: 5000 [IU] via SUBCUTANEOUS
  Filled 2018-08-11 (×2): qty 1

## 2018-08-11 MED ORDER — VANCOMYCIN HCL IN DEXTROSE 750-5 MG/150ML-% IV SOLN: 750.0000 mg | INTRAVENOUS | Status: DC

## 2018-08-11 MED ORDER — SODIUM CHLORIDE 0.9 % IV SOLN
2.0000 g | Freq: Three times a day (TID) | INTRAVENOUS | Status: DC
  Administered 2018-08-11 – 2018-08-13 (×5): 2 g via INTRAVENOUS
  Filled 2018-08-11 (×7): qty 2

## 2018-08-11 MED ORDER — SODIUM CHLORIDE 0.9 % IV SOLN
1.0000 g | Freq: Three times a day (TID) | INTRAVENOUS | Status: DC
  Administered 2018-08-11: 1 g via INTRAVENOUS
  Filled 2018-08-11 (×2): qty 1

## 2018-08-11 MED ORDER — GABAPENTIN 100 MG PO CAPS
200.0000 mg | ORAL_CAPSULE | Freq: Three times a day (TID) | ORAL | Status: DC
  Administered 2018-08-11 – 2018-08-14 (×9): 200 mg via ORAL
  Filled 2018-08-11 (×9): qty 2

## 2018-08-11 MED ORDER — LIDOCAINE HCL (PF) 1 % IJ SOLN
INTRAMUSCULAR | Status: AC
  Filled 2018-08-11: qty 30

## 2018-08-11 NOTE — Progress Notes (Signed)
Informed MD Posey Pronto that patient refused procedure.

## 2018-08-11 NOTE — Progress Notes (Signed)
[facility] Progress Note  Patient: Anne Black JJH:[id number]   PCP: Patient, No Pcp Per DOB: [birth year]   DOA: 08/10/2018   DOS: 08/11/2018   Date of Service: the patient was seen and examined on 08/11/2018  Brief hospital course: Pt. with PMH of alcoholic cirrhosis with varices and chronic ascites, COPD, bipolar disorder, MS, paroxysmal atrial fibrillation not anticoagulated, smoking, chronic respiratory failure on 2-3 L home oxygen; admitted on 08/10/2018, presented with complaint of shortness of breath, was found to have recurrent ascites. Currently further plan is follow-up on paracentesis.  Subjective: Continues to have shortness of breath as well as nausea.  Reports severe anxiety.  No vomiting.  No diarrhea.  Continues to drink alcohol at home.  Assessment and Plan: Sepsis secondary to UTI, possible SBP. No pneumonia no cellulitis. Hypothermic, hypotensive, and tachycardic on arrival.  Lactic acid 2.4.  No leukocytosis.  UA suggestive of infection.  Chest x-ray with retrocardiac opacity concerning for possible infiltrate. Noted to have mild bilateral lower extremity erythema and warmth to touch on exam. -Received 1 L IV fluid bolus and blood pressure improved Started on IV cefepime and vancomycin. We will transition to just IV Vanco cefepime to cover for urine as well as abdomen concerning for SBP. Blood cultures are growing 1 out of 4 likely contamination. Urine culture pending.  Acute on chronic hyponatremia Sodium 123, chronically low in the setting of liver cirrhosis.  Sodium was ranging between 124-130 last month.   -Received IV fluid for hypotension and sepsis.  Unable to diurese at this time. -Continue to monitor sodium level  AKI Creatinine 1.0, baseline 0.3-0.4.  Prerenal from sepsis/hypotension versus cardiorenal/ hepatorenal. -Received IV fluid for hypotension and sepsis -Repeat BMP in a.m. -Avoid nephrotoxic agents -Monitor urine output  Decompensated  alcoholic cirrhosis with ascites Underwent diagnostic paracentesis in the ED and only 100 cc peritoneal fluid removed.  Gram stain showing no organisms. -Peritoneal fluid culture pending -Abdomen appears distended.   Patient refusing paracentesis in the morning.  Later on after discussion is now agreeable for the paracentesis. -Hold Lasix/spironolactone in the setting of sepsis/hypotension  Hepatic encephalopathy Ammonia level 66.  Appears slightly confused and not completely oriented. -Lactulose  Acutely decompensated diastolic congestive heart failure Currently on 2 L supplemental oxygen, uses 2 to 3 L at home.  Not tachypneic.  Chest x-ray showing cardiomegaly with findings suggestive of worsening volume overload.  Probable trace to small bilateral pleural effusions. Echo done January 2020 showing LVEF 65 to 70% and grade 1 diastolic dysfunction. -Unable to diurese at this time given sepsis/hypotension  Physical deconditioning -PT evaluation  Paroxysmal atrial fibrillation CHA2DS2VASc 2.  Per cardiology documentation, not a candidate for long-term anticoagulation given active alcohol abuse.  Alcohol use disorder No signs of withdrawal at this time. -CIWA protocol; Ativan PRN -Thiamine, folate, multivitamin -Blood ethanol level pending  COPD -Stable.  No wheezing.  DuoNebs PRN.  History of multiple sclerosis Currently not on any medications.  Last neurology visit in March 2020. -Ensure outpatient follow-up  Tobacco use -NicoDerm patch -Counseled to quit  Diet: Cardiac diet DVT Prophylaxis: SCD, pharmacological prophylaxis contraindicated due to Anemia and cirrhosis  Advance goals of care discussion: Full code, palliative care consulted for goals of care discussion.  Family Communication: no family was present at bedside, at the time of interview.   Disposition:  Discharge to SNF .  Consultants: IR Procedures: none  Scheduled Meds: . folic acid  1 mg Oral  Daily  . gabapentin  200 mg Oral TID  . lactulose  10 g Oral BID  . LORazepam  0-4 mg Intravenous Q6H   Followed by  . [START ON 08/13/2018] LORazepam  0-4 mg Intravenous Q12H  . multivitamin with minerals  1 tablet Oral Daily  . nicotine  21 mg Transdermal Daily  . thiamine  100 mg Oral Daily   Or  . thiamine  100 mg Intravenous Daily   Continuous Infusions: . sodium chloride 1,000 mL (08/11/18 0914)  . ceFEPime (MAXIPIME) IV 2 g (08/11/18 1806)   PRN Meds: sodium chloride, acetaminophen **OR** acetaminophen, ipratropium-albuterol, lactulose **AND** lactulose, LORazepam **OR** LORazepam, ondansetron (ZOFRAN) IV Antibiotics: Anti-infectives (From admission, onward)   Start     Dose/Rate Route Frequency Ordered Stop   08/12/18 0600  vancomycin (VANCOCIN) IVPB 750 mg/150 ml premix  Status:  Discontinued     750 mg 150 mL/hr over 60 Minutes Intravenous Every 24 hours 08/11/18 0107 08/11/18 0828   08/11/18 1800  ceFEPIme (MAXIPIME) 2 g in sodium chloride 0.9 % 100 mL IVPB     2 g 200 mL/hr over 30 Minutes Intravenous Every 8 hours 08/11/18 1315     08/11/18 1000  ceFEPIme (MAXIPIME) 1 g in sodium chloride 0.9 % 100 mL IVPB  Status:  Discontinued     1 g 200 mL/hr over 30 Minutes Intravenous Every 8 hours 08/11/18 0107 08/11/18 1315   08/10/18 2115  ceFEPIme (MAXIPIME) 2 g in sodium chloride 0.9 % 100 mL IVPB     2 g 200 mL/hr over 30 Minutes Intravenous  Once 08/10/18 2107 08/11/18 0148   08/10/18 2115  vancomycin (VANCOCIN) 1,500 mg in sodium chloride 0.9 % 500 mL IVPB     1,500 mg 250 mL/hr over 120 Minutes Intravenous  Once 08/10/18 2107 08/11/18 0100       Objective: Physical Exam: Vitals:   08/11/18 1407 08/11/18 1608 08/11/18 1656 08/11/18 2014  BP: (!) 99/51  (!) 109/55 (!) 113/55  Pulse: (!) 105 100 100 (!) 104  Resp: 20 18 20 20   Temp:    98.5 F (36.9 C)  TempSrc:    Oral  SpO2: 97% 97% 97% 98%  Weight:      Height:        Intake/Output Summary (Last 24 hours)  at 08/11/2018 2058 Last data filed at 08/11/2018 1806 Gross per 24 hour  Intake 1300 ml  Output 1275 ml  Net 25 ml   Filed Weights   08/10/18 2300 08/11/18 0603  Weight: 92.7 kg 92.7 kg   General: alert and oriented to time, place, and person. Appear in mild distress, affect tearful Eyes: PERRL, Conjunctiva normal ENT: Oral Mucosa Clear, moist  Neck: difficult to assess  JVD, ono Abnormal Mass Or lumps Cardiovascular: S1 and S2 Present, no Murmur, peripheral pulses symmetrical Respiratory: normal respiratory effort, Bilateral Air entry equal and Decreased, no use of accessory muscle, Clear to Auscultation, no Crackles, no wheezes Abdomen: Bowel Sound present, Soft and mild tenderness, no hernia Skin: no rashes  Extremities: no Pedal edema, no calf tenderness Neurologic: normal without focal findings, mental status, speech normal, alert and oriented x3, PERLA, Motor strength 5/5 and symmetric and sensation grossly normal to light touch Gait not checked due to patient safety concerns  Data Reviewed: CBC: Recent Labs  Lab 08/10/18 1803 08/11/18 0414  WBC 5.4 5.1  NEUTROABS 3.7  --   HGB 8.5* 7.6*  HCT 27.2* 24.5*  MCV 80.5 79.3*  PLT 114* 103*  Basic Metabolic Panel: Recent Labs  Lab 08/10/18 1803 08/11/18 0414  NA 123* 124*  K 4.3 4.2  CL 85* 89*  CO2 27 28  GLUCOSE 101* 98  BUN 7 8  CREATININE 1.05* 0.86  CALCIUM 7.6* 7.5*    Liver Function Tests: Recent Labs  Lab 08/10/18 1803 08/11/18 0414  AST 49* 46*  ALT 25 22  ALKPHOS 151* 146*  BILITOT 3.0* 2.9*  PROT 5.9* 5.4*  ALBUMIN 2.1* 2.0*   Recent Labs  Lab 08/10/18 1803  LIPASE 35   Recent Labs  Lab 08/10/18 1805  AMMONIA 66*   Coagulation Profile: Recent Labs  Lab 08/10/18 1803  INR 1.7*   Cardiac Enzymes: No results for input(s): CKTOTAL, CKMB, CKMBINDEX, TROPONINI in the last 168 hours. BNP (last 3 results) No results for input(s): PROBNP in the last 8760 hours. CBG: No results for  input(s): GLUCAP in the last 168 hours. Studies: [place] Ascites (abdomen Limited)  Result Date: 08/11/2018 CLINICAL DATA:  58 year old female with hepatic cirrhosis, portal hypertension and large volume ascites EXAM: LIMITED ABDOMEN ULTRASOUND FOR ASCITES TECHNIQUE: Limited ultrasound survey for ascites was performed in all four abdominal quadrants. COMPARISON:  CT scan of the abdomen and pelvis 07/19/2018 FINDINGS: Sonographic interrogation of the 4 quadrants of the abdomen demonstrates large volume ascites. The patient refused paracentesis. IMPRESSION: Large volume ascites. Patient refused paracentesis. Electronically Signed   By: Malachy Moan M.D.   On: 08/11/2018 11:37     Time spent: 35 minutes  Author: Lynden Oxford, MD [facility] 08/11/2018 8:58 PM  To reach On-call, see care teams to locate the attending and reach out to them via [email or web address]. If 7PM-7AM, please contact night-coverage If you still have difficulty reaching the attending provider, please page the [facility] (Director on Call) for [facility] on amion for assistance.

## 2018-08-11 NOTE — Progress Notes (Signed)
PHARMACY - PHYSICIAN COMMUNICATION CRITICAL VALUE ALERT - BLOOD CULTURE IDENTIFICATION (BCID)  Anne Black is an 58 y.o. female who presented to [facility] on 08/10/2018.  Assessment: started on vancomycin and cefepime on admit for sepsis, but antibiotics now narrowed to cefepime.  1 of 4 bottle is growing GPR, likely a contaminant.  Name of physician (or Provider) Contacted: Dr. Posey Pronto  Current antibiotics: cefepime  Changes to prescribed antibiotics recommended:  Patient is on recommended antibiotics - No changes needed  Consider repeating blood cultures  No results found for this or any previous visit.   Anne Black, PharmD, BCPS, [place] 08/11/2018, 6:42 PM

## 2018-08-11 NOTE — Progress Notes (Signed)
Patient was given anxiety medication at 12 and 3 pm,. She states that she is still anxious and that she is getting SOB and she need something additional for anxiety.Paged MD and let him know. Oxygen 97%

## 2018-08-11 NOTE — Progress Notes (Signed)
  Palliative medicine [place] note  Patient seen briefly, chart reviewed.  Patient was scheduled for paracentesis this morning.  She did go down to paracentesis but refused procedure when she got there.  When I met that the bedside this afternoon attempting to initiate goals of care discussion and introduced palliative medicine services, patient began requesting to go back to IR for paracentesis that she is now short of breath and very anxious.  She was unable to speak to me further because of her concerns about anxiety and requests for paracentesis  She does state "I want to go to a facility when I leave here".  She says she has been to [facility], [facility] and one other facility, any of which she stated would be fine.  She states she is no longer able to care for herself at home and this is been impacting her re-hospitalizations  Staffed with Dr. Posey Pronto.  We will follow-up and hopefully be able to begin goals of care conversations with patient in the morning Thank you Romona Curls , NP No charge note

## 2018-08-11 NOTE — Progress Notes (Signed)
Pharmacy Antibiotic Note  Anne Black is a 58 y.o. female admitted on 08/10/2018 with UTI and sepsis.  Pharmacy has been consulted for Vancomycin and Cefepime dosing.  Vancomycin 1500 mg IV given in ED at 2230  Plan: Vancomycin 750 mg IV q24h Cefepime 1 g IV q8h  Height: 5\' 2"  (157.5 cm) Weight: 204 lb 5.9 oz (92.7 kg) IBW/kg (Calculated) : 50.1  Temp (24hrs), Avg:97.7 F (36.5 C), Min:97.7 F (36.5 C), Max:97.7 F (36.5 C)  Recent Labs  Lab 08/10/18 1803  WBC 5.4  CREATININE 1.05*  LATICACIDVEN 2.4*    Estimated Creatinine Clearance: 61.9 mL/min (A) (by C-G formula based on SCr of 1.05 mg/dL (H)).    No Known Allergies   Anne Black 08/11/2018 1:00 AM

## 2018-08-11 NOTE — Progress Notes (Signed)
Patient brought to radiology for possible paracentesis.  Patient is alert, oriented x4.  She understands she is in radiology for paracentesis and is able to clearly explain this to me.  She consented to procedure and was scanned for fluid assessment.  She does have ascites as expected.  Prepared to proceed with procedure when patient pulled up the covers and stated "I want to do this tomorrow.  I'm exhausted."  Again able to demonstrate understanding of procedure and advocate for self.  Discussed that she was in radiology at present for procedure, that MD wanted for lab work, but she refused to uncover and again stated "I'll do it tomorrow."   Informed patient that we would continue to work towards procedure as schedule allows over the weekend.  No procedure performed.  Patient returned to unit.  MD paged.  Brynda Greathouse, MS RD PA-C

## 2018-08-11 NOTE — Progress Notes (Signed)
Asked patient should I call and update family member or friend, she stated there is no need, she is ok.

## 2018-08-11 NOTE — Progress Notes (Signed)
Update given to mother.

## 2018-08-11 NOTE — Evaluation (Signed)
Physical Therapy Evaluation Patient Details Name: Anne Black MRN: [id number] DOB: 17-Feb-1960 Today's Date: 08/11/2018   History of Present Illness  58 y.o. female with medical history significant of alcoholic cirrhosis with varices and chronic ascites, COPD, bipolar disorder, MS, paroxysmal atrial fibrillation not anticoagulated, smoking, chronic respiratory failure on 2-3 L home oxygen presenting to the hospital via EMS. Found to be hypotensive, HR-110's Chest x-ray showing cardiomegaly with findings suggestive of worsening volume overload. Admitted 08/10/2018 for sepsis secondary to UTI with possible pnuemonia, and B LE cellulitis.  Clinical Impression  Pt asleep in bed, saturated in urine due to pt leg placement with [place]. Pt lethargic and reluctant to participate with therapy. Pt repeatedly reports that she is due in court today and she needs to contact her attorney. Pt told that courts are closed due to 4th of July holiday. Pt does not comprehend. PTA pt living alone in multistory home with 1 step to enter. Pt reports using RW for mobility in home and requiring assist for bathing and dressing, however family only checks on her periodically. Pt found laying in feces on entry. Pt is limited in safe mobility by bilateral LE and abdominal pain, and confusion as well as decreased ROM, strength, and balance. Pt requires maxA for bed mobility and min A for transfers and ambulation of 3 feet to recliner. PT recommends SNF level rehab a discharge, however pt has been difficult to place due to alcoholism, and aggressive behavior. PT will continue to follow acutely.     Follow Up Recommendations SNF    Equipment Recommendations  None recommended by PT    Recommendations for Other Services OT consult     Precautions / Restrictions Precautions Precautions: None Restrictions Weight Bearing Restrictions: No      Mobility  Bed Mobility Overal bed mobility: Needs Assistance Bed Mobility: Supine  to Sit     Supine to sit: Max assist     General bed mobility comments: maxA for management of LE to EoB, and trunk to upright, grimacing with sitting upright, due to increased abdominal edema (ascites)  Transfers Overall transfer level: Needs assistance Equipment used: Rolling walker (2 wheeled) Transfers: Sit to/from Stand Sit to Stand: Min assist         General transfer comment: minA for power up and steadying with RW, decreased control with sitting in chair, refuses to assist with scooting hips back in chair  Ambulation/Gait Ambulation/Gait assistance: Min assist Gait Distance (Feet): 3 Feet Assistive device: Rolling walker (2 wheeled) Gait Pattern/deviations: Decreased step length - right;Decreased step length - left;Shuffle;Trunk flexed;Step-to pattern Gait velocity: slowed Gait velocity interpretation: <1.31 ft/sec, indicative of household ambulator General Gait Details: minA for steadying with stepping to RW, slow, steps to recliner       Balance Overall balance assessment: Needs assistance Sitting-balance support: Feet supported;No upper extremity supported Sitting balance-Leahy Scale: Poor Sitting balance - Comments: requires 2 minutes to achieve independent sitting balance, still needs B UE support to maintian Postural control: Posterior lean Standing balance support: Bilateral upper extremity supported Standing balance-Leahy Scale: Poor Standing balance comment: requires B UE support to maintain balance                             Pertinent Vitals/Pain Pain Assessment: Faces Faces Pain Scale: Hurts even more Pain Location: belly and B LE with movement Pain Descriptors / Indicators: Grimacing Pain Intervention(s): Limited activity within patient's tolerance;Monitored during session;Repositioned  Home Living Family/patient expects to be discharged to:: Unsure Living Arrangements: Alone Available Help at Discharge: Available  PRN/intermittently;Family Type of Home: House Home Access: Stairs to enter   [employer] of Steps: 1 Home Layout: Two level;Able to live on main level with bedroom/bathroom Home Equipment: Dan Humphreys - 2 wheels;Cane - single point;Wheelchair - Lawyer Comments: pt able to provide information that agrees with prior admission     Prior Function Level of Independence: Needs assistance   Gait / Transfers Assistance Needed: ambulates with RW in home  ADL's / Homemaking Assistance Needed: reports that she needs assist for bathing and dressing and has no one to help her, EMS notes that pt found in feces with foul smell to house on entry           Extremity/Trunk Assessment   Upper Extremity Assessment Upper Extremity Assessment: Generalized weakness    Lower Extremity Assessment Lower Extremity Assessment: LLE deficits/detail;RLE deficits/detail RLE Deficits / Details: increased edema limiting LE ROM, strength grossly assessed in mobility to be 3/5 LLE Deficits / Details: increased edema limiting LE ROM, strength grossly assessed in mobility to be 3/5       Communication   Communication: No difficulties  Cognition Arousal/Alertness: Lethargic Behavior During Therapy: Agitated;Flat affect Overall Cognitive Status: History of cognitive impairments - at baseline                                 General Comments: oriented to person, place and situation, pt states she is due in court today and that she needs to speak to her attorney, despite being told repeatedly that it is the holiday weekend and the court is closed      General Comments General comments (skin integrity, edema, etc.): Pt with increased edema, errythema, and warmth in bilateral LE, abdomen is taught and painful with trunk flexion. Pt on 3L O2 via [place] on entry SaO2 >90%O2, VSS        Assessment/Plan    PT Assessment Patient needs continued PT services  PT Problem List  Decreased strength;Decreased range of motion;Decreased activity tolerance;Decreased balance;Decreased mobility;Decreased cognition;Decreased safety awareness;Pain       PT Treatment Interventions DME instruction;Gait training;Functional mobility training;Stair training;Therapeutic activities;Therapeutic exercise;Balance training;Patient/family education;Cognitive remediation    PT Goals (Current goals can be found in the Care Plan section)  Acute Rehab PT Goals Patient Stated Goal: contact attorney PT Goal Formulation: With patient Time For Goal Achievement: 08/25/18 Potential to Achieve Goals: Fair    Frequency Min 2X/week   Barriers to discharge Decreased caregiver support         AM-PAC PT "6 Clicks" Mobility  Outcome Measure Help needed turning from your back to your side while in a flat bed without using bedrails?: A Little Help needed moving from lying on your back to sitting on the side of a flat bed without using bedrails?: Total Help needed moving to and from a bed to a chair (including a wheelchair)?: A Little Help needed standing up from a chair using your arms (e.g., wheelchair or bedside chair)?: A Little Help needed to walk in hospital room?: A Lot Help needed climbing 3-5 steps with a railing? : Total 6 Click Score: 13    End of Session Equipment Utilized During Treatment: Gait belt;Oxygen Activity Tolerance: Treatment limited secondary to agitation Patient left: in chair;with call bell/phone within reach;with nursing/sitter in room;with chair alarm set Nurse Communication: Mobility status;Other (  comment)(repeated request for attorney ) PT Visit Diagnosis: Unsteadiness on feet (R26.81);Other abnormalities of gait and mobility (R26.89);Muscle weakness (generalized) (M62.81);History of falling (Z91.81);Difficulty in walking, not elsewhere classified (R26.2);Pain;Adult, failure to thrive (R62.7) Pain - Right/Left: (bilateral ) Pain - part of body: Leg(abdomen)     Time: 3794-3276 PT Time Calculation (min) (ACUTE ONLY): 21 min   Charges:   PT Evaluation $PT Eval Moderate Complexity: 1 Mod          Jaylianna Tatlock B. Migdalia Dk PT, DPT Acute Rehabilitation Services Pager [phone] Office [phone]   [place] 08/11/2018, 8:31 AM

## 2018-08-11 NOTE — Progress Notes (Signed)
Lactic acid 2.3, MD notified, will continue to monitor, Thanks Arvella Nigh RN.

## 2018-08-11 NOTE — Progress Notes (Signed)
Procedure will not be done today. Notified MD as well

## 2018-08-12 ENCOUNTER — Inpatient Hospital Stay (HOSPITAL_COMMUNITY): Payer: Medicare Other

## 2018-08-12 DIAGNOSIS — Z515 Encounter for palliative care: Secondary | ICD-10-CM

## 2018-08-12 DIAGNOSIS — K7200 Acute and subacute hepatic failure without coma: Secondary | ICD-10-CM

## 2018-08-12 DIAGNOSIS — Z7189 Other specified counseling: Secondary | ICD-10-CM

## 2018-08-12 LAB — BLOOD CULTURE ID PANEL (REFLEXED)

## 2018-08-12 LAB — CBC WITH DIFFERENTIAL/PLATELET
Abs Immature Granulocytes: 0.01 10*3/uL (ref 0.00–0.07)
Basophils Absolute: 0 10*3/uL (ref 0.0–0.1)
Basophils Relative: 1 %
Eosinophils Absolute: 0.1 10*3/uL (ref 0.0–0.5)
Eosinophils Relative: 1 %
HCT: 26.3 % — ABNORMAL LOW (ref 36.0–46.0)
Hemoglobin: 8 g/dL — ABNORMAL LOW (ref 12.0–15.0)
Immature Granulocytes: 0 %
Lymphocytes Relative: 15 %
Lymphs Abs: 0.6 10*3/uL — ABNORMAL LOW (ref 0.7–4.0)
MCH: 25.3 pg — ABNORMAL LOW (ref 26.0–34.0)
MCHC: 30.4 g/dL (ref 30.0–36.0)
MCV: 83.2 fL (ref 80.0–100.0)
Monocytes Absolute: 0.6 10*3/uL (ref 0.1–1.0)
Monocytes Relative: 15 %
Neutro Abs: 2.9 10*3/uL (ref 1.7–7.7)
Neutrophils Relative %: 68 %
Platelets: 81 10*3/uL — ABNORMAL LOW (ref 150–400)
RBC: 3.16 MIL/uL — ABNORMAL LOW (ref 3.87–5.11)
RDW: 21.6 % — ABNORMAL HIGH (ref 11.5–15.5)
WBC: 4.2 10*3/uL (ref 4.0–10.5)
nRBC: 0 % (ref 0.0–0.2)

## 2018-08-12 LAB — COMPREHENSIVE METABOLIC PANEL WITH GFR
ALT: 22 U/L (ref 0–44)
AST: 46 U/L — ABNORMAL HIGH (ref 15–41)
Albumin: 2 g/dL — ABNORMAL LOW (ref 3.5–5.0)
Alkaline Phosphatase: 145 U/L — ABNORMAL HIGH (ref 38–126)
Anion gap: 6 (ref 5–15)
BUN: 6 mg/dL (ref 6–20)
CO2: 30 mmol/L (ref 22–32)
Calcium: 7.9 mg/dL — ABNORMAL LOW (ref 8.9–10.3)
Chloride: 92 mmol/L — ABNORMAL LOW (ref 98–111)
Creatinine, Ser: 0.62 mg/dL (ref 0.44–1.00)
GFR calc Af Amer: >60 mL/min
GFR calc non Af Amer: >60 mL/min
Glucose, Bld: 93 mg/dL (ref 70–99)
Potassium: 4.5 mmol/L (ref 3.5–5.1)
Sodium: 128 mmol/L — ABNORMAL LOW (ref 135–145)
Total Bilirubin: 4.4 mg/dL — ABNORMAL HIGH (ref 0.3–1.2)
Total Protein: 5.6 g/dL — ABNORMAL LOW (ref 6.5–8.1)

## 2018-08-12 LAB — BODY FLUID CELL COUNT WITH DIFFERENTIAL
Lymphs, Fluid: 85 %
Monocyte-Macrophage-Serous Fluid: 14 % — ABNORMAL LOW (ref 50–90)
Neutrophil Count, Fluid: 1 % (ref 0–25)
Total Nucleated Cell Count, Fluid: 67 cu mm (ref 0–1000)

## 2018-08-12 LAB — GRAM STAIN

## 2018-08-12 LAB — MAGNESIUM: Magnesium: 2 mg/dL (ref 1.7–2.4)

## 2018-08-12 LAB — LACTATE DEHYDROGENASE: LDH: 154 U/L (ref 98–192)

## 2018-08-12 LAB — URINE CULTURE: Culture: <10000 — AB

## 2018-08-12 MED ORDER — SPIRONOLACTONE 25 MG PO TABS
25.0000 mg | ORAL_TABLET | Freq: Every day | ORAL | Status: DC
  Administered 2018-08-12 – 2018-08-15 (×4): 25 mg via ORAL
  Filled 2018-08-12 (×4): qty 1

## 2018-08-12 MED ORDER — FUROSEMIDE 40 MG PO TABS
40.0000 mg | ORAL_TABLET | Freq: Every day | ORAL | Status: DC
  Administered 2018-08-12 – 2018-08-20 (×9): 40 mg via ORAL
  Filled 2018-08-12 (×9): qty 1

## 2018-08-12 MED ORDER — LIDOCAINE HCL (PF) 1 % IJ SOLN
INTRAMUSCULAR | Status: AC
  Filled 2018-08-12: qty 10

## 2018-08-12 MED ORDER — ALBUMIN HUMAN 25 % IV SOLN
25.0000 g | Freq: Four times a day (QID) | INTRAVENOUS | Status: AC
  Administered 2018-08-12 – 2018-08-13 (×2): 25 g via INTRAVENOUS
  Filled 2018-08-12 (×2): qty 100

## 2018-08-12 NOTE — Progress Notes (Signed)
[facility] Progress Note  Patient: Anne Black GLO:[id number]   PCP: Patient, No Pcp Per DOB: 07/11/1960   DOA: 08/10/2018   DOS: 08/12/2018   Date of Service: the patient was seen and examined on 08/12/2018  Brief hospital course: Pt. with PMH of alcoholic cirrhosis with varices and chronic ascites, COPD, bipolar disorder, MS, paroxysmal atrial fibrillation not anticoagulated, smoking, chronic respiratory failure on 2-3 L home oxygen; admitted on 08/10/2018, presented with complaint of shortness of breath, was found to have recurrent ascites. Currently further plan is follow-up on paracentesis.  Subjective: Breathing still having.  Reports anxiety.  No nausea no vomiting.  No diarrhea.  Agreeable for the paracentesis procedure today.  Assessment and Plan: Sepsis secondary to UTI, possible SBP. No pneumonia no cellulitis. Hypothermic, hypotensive, and tachycardic on arrival.  Lactic acid 2.4.  No leukocytosis.  UA suggestive of infection.  Chest x-ray with retrocardiac opacity concerning for possible infiltrate. Noted to have mild bilateral lower extremity erythema and warmth to touch on exam. -Received 1 L IV fluid bolus and blood pressure improved Started on IV cefepime and vancomycin. We will transition to just IV cefepime to cover for urine as well as abdomen concerning for SBP. Blood cultures are growing 1 out of 4 likely contamination. Urine culture pending.  Acute on chronic hyponatremia Sodium 123, chronically low in the setting of liver cirrhosis.  Sodium was ranging between 124-130 last month.   -Received IV fluid for hypotension and sepsis.  Unable to diurese at this time. -Continue to monitor sodium level  AKI Creatinine 1.0, baseline 0.3-0.4.  Prerenal from sepsis/hypotension versus cardiorenal/ hepatorenal. -Received IV fluid for hypotension and sepsis -Repeat BMP in a.m. -Avoid nephrotoxic agents -Monitor urine output  Decompensated alcoholic cirrhosis with  ascites Underwent diagnostic paracentesis in the ED and only 100 cc peritoneal fluid removed.  Gram stain showing no organisms. -Peritoneal fluid culture pending -Abdomen appears distended.   Underwent paracentesis.  Procedure had to be stopped due to hypotension.  Will provide IV albumin every 6 hours x2. -Hold Lasix/spironolactone in the setting of sepsis/hypotension  Hepatic encephalopathy Ammonia level 66.  Appears slightly confused and not completely oriented. -Lactulose  Acutely decompensated diastolic congestive heart failure Currently on 2 L supplemental oxygen, uses 2 to 3 L at home.  Not tachypneic.  Chest x-ray showing cardiomegaly with findings suggestive of worsening volume overload.  Probable trace to small bilateral pleural effusions. Echo done January 2020 showing LVEF 65 to 70% and grade 1 diastolic dysfunction. -Unable to diurese at this time given sepsis/hypotension  Physical deconditioning -PT evaluation  Paroxysmal atrial fibrillation CHA2DS2VASc 2.  Per cardiology documentation, not a candidate for long-term anticoagulation given active alcohol abuse.  Alcohol use disorder No signs of withdrawal at this time. -CIWA protocol; Ativan PRN -Thiamine, folate, multivitamin -Blood ethanol level pending  COPD -Stable.  No wheezing.  DuoNebs PRN.  History of multiple sclerosis Currently not on any medications.  Last neurology visit in March 2020. -Ensure outpatient follow-up  Tobacco use -NicoDerm patch -Counseled to quit  Diet: Cardiac diet DVT Prophylaxis: SCD, pharmacological prophylaxis contraindicated due to Anemia and cirrhosis  Advance goals of care discussion: Full code, palliative care consulted for goals of care discussion.  Family Communication: no family was present at bedside, at the time of interview.   Disposition:  Discharge to SNF .  Consultants: IR Procedures: none  Scheduled Meds: . lidocaine (PF)      . folic acid  1 mg  Oral Daily  .  furosemide  40 mg Oral Daily  . gabapentin  200 mg Oral TID  . lactulose  10 g Oral BID  . LORazepam  0-4 mg Intravenous Q6H   Followed by  . [START ON 08/13/2018] LORazepam  0-4 mg Intravenous Q12H  . multivitamin with minerals  1 tablet Oral Daily  . nicotine  21 mg Transdermal Daily  . spironolactone  25 mg Oral Daily  . thiamine  100 mg Oral Daily   Or  . thiamine  100 mg Intravenous Daily   Continuous Infusions: . sodium chloride 1,000 mL (08/11/18 0914)  . albumin human Stopped (08/12/18 1647)  . ceFEPime (MAXIPIME) IV 200 mL/hr at 08/12/18 1700   PRN Meds: sodium chloride, acetaminophen **OR** acetaminophen, ipratropium-albuterol, lactulose **AND** lactulose, LORazepam **OR** LORazepam, ondansetron (ZOFRAN) IV Antibiotics: Anti-infectives (From admission, onward)   Start     Dose/Rate Route Frequency Ordered Stop   08/12/18 0600  vancomycin (VANCOCIN) IVPB 750 mg/150 ml premix  Status:  Discontinued     750 mg 150 mL/hr over 60 Minutes Intravenous Every 24 hours 08/11/18 0107 08/11/18 0828   08/11/18 1800  ceFEPIme (MAXIPIME) 2 g in sodium chloride 0.9 % 100 mL IVPB     2 g 200 mL/hr over 30 Minutes Intravenous Every 8 hours 08/11/18 1315     08/11/18 1000  ceFEPIme (MAXIPIME) 1 g in sodium chloride 0.9 % 100 mL IVPB  Status:  Discontinued     1 g 200 mL/hr over 30 Minutes Intravenous Every 8 hours 08/11/18 0107 08/11/18 1315   08/10/18 2115  ceFEPIme (MAXIPIME) 2 g in sodium chloride 0.9 % 100 mL IVPB     2 g 200 mL/hr over 30 Minutes Intravenous  Once 08/10/18 2107 08/11/18 0148   08/10/18 2115  vancomycin (VANCOCIN) 1,500 mg in sodium chloride 0.9 % 500 mL IVPB     1,500 mg 250 mL/hr over 120 Minutes Intravenous  Once 08/10/18 2107 08/11/18 0100       Objective: Physical Exam: Vitals:   08/12/18 1536 08/12/18 1609 08/12/18 1648 08/12/18 1734  BP: (!) 96/50 (!) 93/50 (!) 95/45 (!) 101/50  Pulse:  93  66  Resp:  18    Temp:  98.5 F (36.9 C)     TempSrc:  Oral    SpO2:  98%    Weight:      Height:        Intake/Output Summary (Last 24 hours) at 08/12/2018 1817 Last data filed at 08/12/2018 1808 Gross per 24 hour  Intake 1761.9 ml  Output 4600 ml  Net -2838.1 ml   Filed Weights   08/10/18 2300 08/11/18 0603 08/12/18 0604  Weight: 92.7 kg 92.7 kg 93.1 kg   General: alert and oriented to time, place, and person. Appear in mild distress, affect tearful Eyes: PERRL, Conjunctiva normal ENT: Oral Mucosa Clear, moist  Neck: difficult to assess  JVD, ono Abnormal Mass Or lumps Cardiovascular: S1 and S2 Present, no Murmur, peripheral pulses symmetrical Respiratory: normal respiratory effort, Bilateral Air entry equal and Decreased, no use of accessory muscle, Clear to Auscultation, no Crackles, no wheezes Abdomen: Bowel Sound present, Soft and mild tenderness, no hernia Skin: no rashes  Extremities: no Pedal edema, no calf tenderness Neurologic: normal without focal findings, mental status, speech normal, alert and oriented x3, PERLA, Motor strength 5/5 and symmetric and sensation grossly normal to light touch Gait not checked due to patient safety concerns  Data Reviewed: CBC: Recent Labs  Lab 08/10/18 1803 08/11/18  0414 08/12/18 0420  WBC 5.4 5.1 4.2  NEUTROABS 3.7  --  2.9  HGB 8.5* 7.6* 8.0*  HCT 27.2* 24.5* 26.3*  MCV 80.5 79.3* 83.2  PLT 114* 103* 81*   Basic Metabolic Panel: Recent Labs  Lab 08/10/18 1803 08/11/18 0414 08/12/18 0420  NA 123* 124* 128*  K 4.3 4.2 4.5  CL 85* 89* 92*  CO2 27 28 30   GLUCOSE 101* 98 93  BUN 7 8 6   CREATININE 1.05* 0.86 0.62  CALCIUM 7.6* 7.5* 7.9*  MG  --   --  2.0    Liver Function Tests: Recent Labs  Lab 08/10/18 1803 08/11/18 0414 08/12/18 0420  AST 49* 46* 46*  ALT 25 22 22   ALKPHOS 151* 146* 145*  BILITOT 3.0* 2.9* 4.4*  PROT 5.9* 5.4* 5.6*  ALBUMIN 2.1* 2.0* 2.0*   Recent Labs  Lab 08/10/18 1803  LIPASE 35   Recent Labs  Lab 08/10/18 1805  AMMONIA  66*   Coagulation Profile: Recent Labs  Lab 08/10/18 1803  INR 1.7*   Cardiac Enzymes: No results for input(s): CKTOTAL, CKMB, CKMBINDEX, TROPONINI in the last 168 hours. BNP (last 3 results) No results for input(s): PROBNP in the last 8760 hours. CBG: No results for input(s): GLUCAP in the last 168 hours. Studies: US Paracentesis  Result Date: 08/12/2018 INDICATION: Patient with recurrent ascites. Request made for diagnostic and therapeutic paracentesis. EXAM: ULTRASOUND GUIDED DIAGNOSTIC AND THERAPEUTIC PARACENTESIS MEDICATIONS: 10 mL 1% lidocaine COMPLICATIONS: None immediate. PROCEDURE: Informed written consent was obtained from the patient after a discussion of the risks, benefits and alternatives to treatment. A timeout was performed prior to the initiation of the procedure. Initial ultrasound scanning demonstrates a moderate amount of ascites within the right lower abdominal quadrant. The right lower abdomen was prepped and draped in the usual sterile fashion. 1% lidocaine was used for local anesthesia. Following this, a 19 gauge, 7-cm, Yueh catheter was introduced. An ultrasound image was saved for documentation purposes. The paracentesis was performed. The catheter was removed and a dressing was applied. The patient tolerated the procedure well without immediate post procedural complication. FINDINGS: A total of approximately 2.7 liters of clear, yellow fluid was removed. Samples were sent to the laboratory as requested by the clinical team. Procedure stopped prior to removal of all fluid due to hypotension. IMPRESSION: Successful ultrasound-guided diagnostic and therapeutic paracentesis yielding 2.7 liters of peritoneal fluid. Read by: Brynda Greathouse PA-C Electronically Signed   By: Jerilynn Mages.  Shick M.D.   On: 08/12/2018 14:07     Time spent: 35 minutes  Author: Berle Mull, MD [facility] 08/12/2018 6:17 PM  To reach On-call, see care teams to locate the attending and reach out to  them via [email or web address]. If 7PM-7AM, please contact night-coverage If you still have difficulty reaching the attending provider, please page the [facility] (Director on Call) for [facility] on amion for assistance.

## 2018-08-12 NOTE — NC FL2 (Signed)
[place] LEVEL OF CARE SCREENING TOOL     IDENTIFICATION  Patient Name: Anne Black Birthdate: [birth year] Sex: female Admission Date (Current Location): 08/10/2018  [facility] and [place] Number:  Herbalist and Address:  [facility] [place] [place] [address] [place], [place] [address]      Provider Number: [id number]  Attending Physician Name and Address:  Lavina Hamman, MD  Relative Name and Phone Number:  Garnet Koyanagi ([phone]    Current Level of Care: Hospital Recommended Level of Care: [place] Prior Approval Number:    Date Approved/Denied:   PASRR Number: (under review)  Discharge Plan: SNF    Current Diagnoses: Patient Active Problem List   Diagnosis Date Noted  . Palliative care by specialist   . Sepsis ([place]) 08/11/2018  . AKI (acute kidney injury) ([place]) 08/11/2018  . UTI (urinary tract infection) 08/10/2018  . Cellulitis of lower extremity   . Peripheral edema   . Atrial fibrillation with RVR ([place]) 06/13/2018  . Alcohol dependence ([place]) 05/06/2018  . Electrolyte disturbance 05/05/2018  . Alcohol withdrawal delirium ([place]) 04/28/2018  . Weakness generalized 04/27/2018  . Gait abnormality 04/24/2018  . Chronic hypoxemic respiratory failure ([place]) 04/11/2018  . Atrial fibrillation with rapid ventricular response ([place]) 03/23/2018  . Head injury   . Fall 03/19/2018  . Syncope 03/19/2018  . Orbital fracture 03/19/2018  . Scalp laceration 03/19/2018  . Acute respiratory failure with hypoxia and hypercarbia ([place]) 03/19/2018  . Abdominal tenderness 03/19/2018  . Acute on chronic respiratory failure with hypoxia ([place]) 03/17/2018  . Alcohol abuse with intoxication ([place]) 03/17/2018  . ETOH abuse   . Normocytic anemia 03/16/2018  . Acute metabolic encephalopathy 73/41/9379  . Lower extremity edema 03/16/2018  . Migraine 03/16/2018  . Community acquired pneumonia of left lower lobe of lung ([place])    . Weakness 08/21/2017  . COPD exacerbation ([place]) 06/12/2017  . Chest pain with moderate risk for cardiac etiology 05/30/2017  . Abnormal LFTs 05/30/2017  . Tachycardia 05/30/2017  . COPD (chronic obstructive pulmonary disease) ([place]) 04/19/2017  . Right rib fracture 04/15/2017  . Abdominal wall cellulitis 12/30/2016  . S/P exploratory laparotomy 12/30/2016  . Distal radius fracture, left 09/09/2016  . Thrombocytopenia ([place]) 09/09/2016  . Chest x-ray abnormality   . Umbilical hernia without obstruction and without gangrene 05/12/2016  . Itching 02/24/2016  . Ventral hernia without obstruction or gangrene 02/24/2016  . Goals of care, counseling/discussion   . Ascites   . Tachypnea   . Atrial flutter with rapid ventricular response ([place])   . Hypomagnesemia   . Hypoxia   . Dehydration 01/22/2016  . Low back ache 12/30/2015  . Non-intractable vomiting with nausea   . Alcohol abuse with alcohol-induced mood disorder ([place]) 12/15/2015  . Acute alcoholism ([place])   . Distended abdomen 11/04/2015  . Alcohol abuse   . Dyspnea   . Acute respiratory failure ([place]) 10/15/2015  . Ascites due to alcoholic cirrhosis ([place]) 02/40/9735  . Osteoporosis   . Multiple sclerosis ([place])   . Alcoholic cirrhosis of liver with ascites ([place]) 08/22/2015  . Bipolar disorder, current episode mixed, moderate ([place]) 08/01/2015  . Alcohol use disorder, severe, dependence ([place]) 07/31/2015  . Macrocytic anemia- due to alcohol abuse with normal B12 & folate levels 05/31/2015  . Severe protein-calorie malnutrition ([place]) 05/31/2015  . Hypokalemia 05/26/2015  . Acute hepatic encephalopathy 05/26/2015  . Alcohol withdrawal ([place]) 05/18/2015  . Abdominal pain 05/18/2015  . Anxiety  05/18/2015  . Stimulant abuse (HCC) 12/27/2014  . Nicotine abuse 12/27/2014  . Hyperprolactinemia (HCC) 11/15/2014  . Dyslipidemia   . Alcohol use disorder, moderate, dependence (HCC) 10/30/2014  . Tobacco abuse 01/23/2014  . Bipolar 1  disorder (HCC) 04/07/2013  . Noncompliance with therapeutic plan 04/04/2013  . Hereditary and idiopathic peripheral neuropathy 05/01/2012  . Depression 05/01/2012  . GERD (gastroesophageal reflux disease) 05/01/2012  . Osteoarthrosis, unspecified whether generalized or localized, involving lower leg 05/01/2012  . Hyponatremia 07/01/2011    Orientation RESPIRATION BLADDER Height & Weight     Self, Time, Situation, Place  O2(nasal cannula 2L/min) Incontinent, External catheter Weight: 205 lb 4 oz (93.1 kg) Height:  5\' 2"  (157.5 cm)  BEHAVIORAL SYMPTOMS/MOOD NEUROLOGICAL BOWEL NUTRITION STATUS      Continent Diet(see discharge summary)  AMBULATORY STATUS COMMUNICATION OF NEEDS Skin   Extensive Assist Verbally Skin abrasions(abrasion on buttocks, excoriated arms, MASD buttocks and groin)                       Personal Care Assistance Level of Assistance  Bathing, Feeding, Dressing, Total care Bathing Assistance: Limited assistance Feeding assistance: Limited assistance Dressing Assistance: Limited assistance Total Care Assistance: Maximum assistance   Functional Limitations Info  Sight, Hearing, Speech Sight Info: Adequate Hearing Info: Adequate Speech Info: Adequate    SPECIAL CARE FACTORS FREQUENCY  PT (By licensed PT), OT (By licensed OT)     PT Frequency: min 5x weekly OT Frequency: min 5x weekly            Contractures Contractures Info: Not present    Additional Factors Info  Code Status, Allergies Code Status Info: Full Allergies Info: No Known Allergies           Current Medications (08/12/2018):  This is the current hospital active medication list Current Facility-Administered Medications  Medication Dose Route Frequency Provider Last Rate Last Dose  . lidocaine (PF) (XYLOCAINE) 1 % injection           . 0.9 %  sodium chloride infusion   Intravenous PRN Rolly SalterPatel, Pranav M, MD 10 mL/hr at 08/11/18 0914 1,000 mL at 08/11/18 0914  . acetaminophen  (TYLENOL) tablet 650 mg  650 mg Oral Q6H PRN John Giovanniathore, Vasundhra, MD       Or  . acetaminophen (TYLENOL) suppository 650 mg  650 mg Rectal Q6H PRN John Giovanniathore, Vasundhra, MD      . albumin human 25 % solution 25 g  25 g Intravenous Q6H Rolly SalterPatel, Pranav M, MD      . ceFEPIme (MAXIPIME) 2 g in sodium chloride 0.9 % 100 mL IVPB  2 g Intravenous Q8H Robertson, Crystal S, RPH 200 mL/hr at 08/12/18 0948 2 g at 08/12/18 0948  . folic acid (FOLVITE) tablet 1 mg  1 mg Oral Daily John Giovanniathore, Vasundhra, MD   1 mg at 08/12/18 0949  . furosemide (LASIX) tablet 40 mg  40 mg Oral Daily Rolly SalterPatel, Pranav M, MD   40 mg at 08/12/18 1041  . gabapentin (NEURONTIN) capsule 200 mg  200 mg Oral TID Rolly SalterPatel, Pranav M, MD   200 mg at 08/12/18 0949  . ipratropium-albuterol (DUONEB) 0.5-2.5 (3) MG/3ML nebulizer solution 3 mL  3 mL Nebulization Q6H PRN John Giovanniathore, Vasundhra, MD      . lactulose (CHRONULAC) 10 GM/15ML solution 10 g  10 g Oral BID John Giovanniathore, Vasundhra, MD   10 g at 08/11/18 1113   And  . lactulose (CHRONULAC) 10 GM/15ML solution 10 g  10 g Oral Daily PRN John Giovanniathore, Vasundhra, MD      . LORazepam (ATIVAN) injection 0-4 mg  0-4 mg Intravenous Q6H John Giovanniathore, Vasundhra, MD   1 mg at 08/12/18 1213   Followed by  . [START ON 08/13/2018] LORazepam (ATIVAN) injection 0-4 mg  0-4 mg Intravenous Q12H John Giovanniathore, Vasundhra, MD      . LORazepam (ATIVAN) tablet 1 mg  1 mg Oral Q6H PRN John Giovanniathore, Vasundhra, MD   1 mg at 08/11/18 1451   Or  . LORazepam (ATIVAN) injection 1 mg  1 mg Intravenous Q6H PRN John Giovanniathore, Vasundhra, MD   1 mg at 08/12/18 0955  . multivitamin with minerals tablet 1 tablet  1 tablet Oral Daily John Giovanniathore, Vasundhra, MD   1 tablet at 08/12/18 0949  . nicotine (NICODERM CQ - dosed in mg/24 hours) patch 21 mg  21 mg Transdermal Daily John Giovanniathore, Vasundhra, MD   21 mg at 08/12/18 0953  . ondansetron (ZOFRAN) injection 4 mg  4 mg Intravenous Q6H PRN Rolly SalterPatel, Pranav M, MD   4 mg at 08/11/18 1110  . spironolactone (ALDACTONE) tablet 25 mg  25 mg Oral  Daily Rolly SalterPatel, Pranav M, MD   25 mg at 08/12/18 1041  . thiamine (VITAMIN B-1) tablet 100 mg  100 mg Oral Daily John Giovanniathore, Vasundhra, MD   100 mg at 08/12/18 [id number]   Or  . thiamine (B-1) injection 100 mg  100 mg Intravenous Daily John Giovanniathore, Vasundhra, MD         Discharge Medications: Please see discharge summary for a list of discharge medications.  Relevant Imaging Results:  Relevant Lab Results:   Additional Information SSN: [id number]  Gildardo GriffesAshley M Heaton Sarin, LCSW

## 2018-08-12 NOTE — Evaluation (Signed)
Occupational Therapy Evaluation Patient Details Name: Anne Black MRN: [id number] DOB: 24-Nov-1960 Today's Date: 08/12/2018    History of Present Illness 58 y.o. female with medical history significant of alcoholic cirrhosis with varices and chronic ascites, COPD, bipolar disorder, MS, paroxysmal atrial fibrillation not anticoagulated, smoking, chronic respiratory failure on 2-3 L home oxygen presenting to the hospital via EMS. Found to be hypotensive, HR-110's Chest x-ray showing cardiomegaly with findings suggestive of worsening volume overload. Admitted 08/10/2018 for sepsis secondary to UTI with possible pnuemonia, and B LE cellulitis.   Clinical Impression   Pt PTA: living alone without ability to care for self. Pt currently performing bed mobility with modA; transfers with minA and inability to stand for <1 min due to weakness and groin region soreness. Pt set-upA for feeding; minA for UB ADL and maxA to [place] for LB ADL. Pt limited by pain, weakness and poor activity tolerance. Pt very unmotivated stating "do I have to do this?" Pt stating "I need to get off my bottom." OTR explained the importance of sit to stads adn sitting upright, but pt refusing and returning to supine to conlcude.  Pt would benefit from continued OT skilled services for ADL, mobiltiy and energy conservation. Pt unsafe to go home with 24/7 care and ability to ambulate/transfer and cook meals. SNF recommendation. OT following acutely.  VSS. O2 >90% on 3L.    Follow Up Recommendations  SNF    Equipment Recommendations  3 in 1 bedside commode    Recommendations for Other Services       Precautions / Restrictions Precautions Precautions: None Restrictions Weight Bearing Restrictions: No      Mobility Bed Mobility Overal bed mobility: Needs Assistance Bed Mobility: Supine to Sit     Supine to sit: Mod assist     General bed mobility comments: modA for initiating BLE movement and then trunk elevation with  hips  Transfers Overall transfer level: Needs assistance Equipment used: Rolling walker (2 wheeled) Transfers: Sit to/from Stand Sit to Stand: Min assist         General transfer comment: minA for power up. Refusing to transfer to recliner.    Balance Overall balance assessment: Needs assistance Sitting-balance support: Feet supported;No upper extremity supported Sitting balance-Leahy Scale: Poor     Standing balance support: Bilateral upper extremity supported Standing balance-Leahy Scale: Poor Standing balance comment: requires B UE support to maintain balance                           ADL either performed or assessed with clinical judgement   ADL Overall ADL's : Needs assistance/impaired Eating/Feeding: Set up;Sitting   Grooming: Minimal assistance;Sitting   Upper Body Bathing: Minimal assistance;Sitting;Cueing for safety   Lower Body Bathing: Maximal assistance;Total assistance;Sitting/lateral leans;Bed level   Upper Body Dressing : Minimal assistance;Sitting   Lower Body Dressing: Maximal assistance;Sitting/lateral leans   Toilet Transfer: Total assistance Toilet Transfer Details (indicate cue type and reason): pt denied need for transfer at this time. stating "I don't even want to do this."  Toileting- Clothing Manipulation and Hygiene: Total assistance;Sitting/lateral lean;Bed level Toileting - Clothing Manipulation Details (indicate cue type and reason): use purewick     Functional mobility during ADLs: Total assistance;Rolling walker;Cueing for sequencing General ADL Comments: set-upA for eating/drinking; minA for UB and MaxA to [place] for LB ADL.     Vision Patient Visual Report: Other (comment)(eyes were not always centered) Vision Assessment?: Vision impaired- to be further  tested in functional context     Perception     Praxis      Pertinent Vitals/Pain Pain Assessment: Faces Faces Pain Scale: Hurts even more Pain Location: belly and  B LE with movement Pain Descriptors / Indicators: Grimacing Pain Intervention(s): Limited activity within patient's tolerance;RN gave pain meds during session     Hand Dominance     Extremity/Trunk Assessment Upper Extremity Assessment Upper Extremity Assessment: Generalized weakness   Lower Extremity Assessment Lower Extremity Assessment: Defer to PT evaluation RLE Deficits / Details: increased edema limiting LE ROM LLE Deficits / Details: increased edema limiting LE ROM,   Cervical / Trunk Assessment Cervical / Trunk Assessment: Other exceptions Cervical / Trunk Exceptions: large body habitus   Communication Communication Communication: No difficulties   Cognition Arousal/Alertness: Lethargic Behavior During Therapy: Agitated;Flat affect Overall Cognitive Status: History of cognitive impairments - at baseline                                 General Comments: oriented to person, place and situation, pt states she is due in court today and that she needs to speak to her attorney, despite being told repeatedly that it is the holiday weekend and the court is closed   General Comments  Pt very unmotivated stating "do I have to do this?" Pt stating "I need to get off my bottom." OTR explained the importance of sit to stads adn sitting upright, but pt refusing and returning to supine to conlcude.     Exercises     Shoulder Instructions      Home Living Family/patient expects to be discharged to:: Unsure Living Arrangements: Alone Available Help at Discharge: Available PRN/intermittently;Family Type of Home: House Home Access: Stairs to enter [employer] of Steps: 1   Home Layout: Two level;Able to live on main level with bedroom/bathroom     Bathroom Shower/Tub: Producer, television/film/video: Standard Bathroom Accessibility: Yes   Home Equipment: Environmental consultant - 2 wheels;Cane - single point;Wheelchair - Sport and exercise psychologist Comments:  pt able to provide information that agrees with prior admission       Prior Functioning/Environment Level of Independence: Needs assistance  Gait / Transfers Assistance Needed: ambulates with RW in home ADL's / Homemaking Assistance Needed: reports that she needs assist for bathing and dressing and has no one to help her, EMS notes that pt found in feces with foul smell to house on entry            OT Problem List: Decreased strength;Decreased activity tolerance;Impaired balance (sitting and/or standing);Impaired vision/perception;Decreased safety awareness;Decreased cognition;Decreased coordination;Decreased knowledge of use of DME or AE;Pain      OT Treatment/Interventions: Self-care/ADL training;Therapeutic exercise;Neuromuscular education;Energy conservation;DME and/or AE instruction;Therapeutic activities;Patient/family education;Balance training    OT Goals(Current goals can be found in the care plan section) Acute Rehab OT Goals Patient Stated Goal: to hire help at home OT Goal Formulation: With patient Time For Goal Achievement: 08/26/18 Potential to Achieve Goals: Good ADL Goals Pt Will Perform Grooming: with modified independence;sitting Pt Will Perform Upper Body Dressing: with set-up;sitting Pt Will Perform Lower Body Dressing: with min assist;with adaptive equipment;sit to/from stand;sitting/lateral leans Pt Will Transfer to Toilet: with set-up;stand pivot transfer;bedside commode Pt Will Perform Toileting - Clothing Manipulation and hygiene: with min assist;sit to/from stand Additional ADL Goal #1: Pt will stand x3 mins for OOB ADL with fair balance and set-upA for task.  OT Frequency: Min 2X/week   Barriers to D/C: Decreased caregiver support  Family not available 24/7       Co-evaluation              AM-PAC OT "6 Clicks" Daily Activity     Outcome Measure Help from another person eating meals?: None Help from another person taking care of personal  grooming?: A Little Help from another person toileting, which includes using toliet, bedpan, or urinal?: Total Help from another person bathing (including washing, rinsing, drying)?: A Lot Help from another person to put on and taking off regular upper body clothing?: A Lot Help from another person to put on and taking off regular lower body clothing?: Total 6 Click Score: 13   End of Session Equipment Utilized During Treatment: Gait belt;Rolling walker Nurse Communication: Mobility status  Activity Tolerance: Treatment limited secondary to agitation;Patient limited by pain Patient left: in bed;with bed alarm set;with call bell/phone within reach  OT Visit Diagnosis: Unsteadiness on feet (R26.81);Muscle weakness (generalized) (M62.81);Adult, failure to thrive (R62.7)                Time: 2956-21300933-1004 OT Time Calculation (min): 31 min Charges:  OT General Charges $OT Visit: 1 Visit OT Evaluation $OT Eval Moderate Complexity: 1 Mod OT Treatments $Self Care/Home Management : 8-22 mins  Revonda StandardAllison Cecil Cranker([place]) [place] OTR/L Acute Rehabilitation Services Pager: [phone] Office: [phone]   Anthonyjames Bargar J Jeweldean Drohan 08/12/2018, 11:09 AM

## 2018-08-12 NOTE — Procedures (Signed)
PROCEDURE SUMMARY:  Successful US guided paracentesis from right lateral abdomen.  Yielded 2.7 liters of yellow, clear fluid.  No immediate complications. Procedure was stopped prior to removal of all fluid due to hypotension. Pt tolerated well.   Specimen was sent for labs.  EBL < 49mL  Docia Barrier PA-C 08/12/2018 2:11 PM

## 2018-08-12 NOTE — Progress Notes (Signed)
PHARMACY - PHYSICIAN COMMUNICATION CRITICAL VALUE ALERT - BLOOD CULTURE IDENTIFICATION (BCID)  Anne Black is an 58 y.o. female who presented to [facility] on 08/10/2018 wit  GPC in clusters, BCID with no detection  Afebrile, WBC WNL  Name of physician (or Provider) Contacted: [facility] ([facility])  Current antibiotics: Cefepime   Changes to prescribed antibiotics recommended:  No changes needed, possible contaminant   Results for orders placed or performed during the hospital encounter of 08/10/18  Blood Culture ID Panel (Reflexed) (Collected: 08/10/2018  6:15 PM)  Result Value Ref Range   Enterococcus species NOT DETECTED NOT DETECTED   Listeria monocytogenes NOT DETECTED NOT DETECTED   Staphylococcus species NOT DETECTED NOT DETECTED   Staphylococcus aureus (BCID) NOT DETECTED NOT DETECTED   Streptococcus species NOT DETECTED NOT DETECTED   Streptococcus agalactiae NOT DETECTED NOT DETECTED   Streptococcus pneumoniae NOT DETECTED NOT DETECTED   Streptococcus pyogenes NOT DETECTED NOT DETECTED   Acinetobacter baumannii NOT DETECTED NOT DETECTED   Enterobacteriaceae species NOT DETECTED NOT DETECTED   Enterobacter cloacae complex NOT DETECTED NOT DETECTED   Escherichia coli NOT DETECTED NOT DETECTED   Klebsiella oxytoca NOT DETECTED NOT DETECTED   Klebsiella pneumoniae NOT DETECTED NOT DETECTED   Proteus species NOT DETECTED NOT DETECTED   Serratia marcescens NOT DETECTED NOT DETECTED   Haemophilus influenzae NOT DETECTED NOT DETECTED   Neisseria meningitidis NOT DETECTED NOT DETECTED   Pseudomonas aeruginosa NOT DETECTED NOT DETECTED   Candida albicans NOT DETECTED NOT DETECTED   Candida glabrata NOT DETECTED NOT DETECTED   Candida krusei NOT DETECTED NOT DETECTED   Candida parapsilosis NOT DETECTED NOT DETECTED   Candida tropicalis NOT DETECTED NOT DETECTED    [name] 08/12/2018  4:02 AM

## 2018-08-12 NOTE — Consult Note (Signed)
Consultation Note Date: 08/12/2018   Patient Name: Anne Black  DOB: [birth year]  MRN: [id number]  Age / Sex: 58 y.o., female  PCP: Patient, No Pcp Per Referring Physician: Rolly Salter, MD  Reason for Consultation: Establishing goals of care and Psychosocial/spiritual support  HPI/Patient Profile: 58 y.o. female  with past medical history of advanced alcoholic cirrhosis, esophageal varices, portal hypertension, bipolar disorder, multiple sclerosis, A. fib, chronic ascites with paracentesis, COPD, diastolic heart failure admitted on 08/10/2018 with shortness of breath and altered mental status.  Patient has had 43 emergency room visits in the last 6 months, 11 inpatient admissions.  She reported not being able to tolerate her lactulose lately because the severity of her diarrhea.  Her ammonia level was 66 on admission.  Also found to have a urinary tract infection.  Consult ordered for goals of care.  Palliative medicine team has seen [name] back in 2017.   Clinical Assessment and Goals of Care: Patient seen, chart reviewed.  She is alert and oriented to person place and situation.  She shows limited insight into her alcoholism although she is verbalizing readiness for sobriety.  She reports a period of time of "years", of sobriety in the past.  She tells me she either wants to go to a skilled nursing facility where she will be unable to obtain alcohol or sober living community where she can stay long-term.  Patient is on disability.  She is divorced but still has communication with her ex-husband, Azizi Lile at [phone].  I asked [name] if she had a healthcare power of attorney and she stated she did not.  I told her in the state of [place] it would automatically fall to her daughter, Anne Black, who is 85 years old at [phone].  When palliative medicinemet with [name] in the past,  conversations were held with patient's mother, [name] as well as daughter.  There are no other children involved.  Also discussed CODE STATUS.  Defined full code as well as DNR.  Patient at this point is opting for full scope of treatment.  She is asking me to go clean her house   SUMMARY OF RECOMMENDATIONS   Confirmed full code, full scope of treatment Discharge to skilled nursing facility or long-term sober living  Community Patient would clearly benefit from ongoing goals of care.  If she is discharged to a skilled nursing facility please place on discharge summary resources for the SNF to contact for community-based palliative care: Hospice and palliative care of 's palliative medicine division at [phone]; care connection at [phone]  Code Status/Advance Care Planning:  Full code    Symptom Management:   Anxiety: Patient likely has been using alcohol to self medicate underlying anxiety disorder.  Hydroxyzine may be an alternative to benzodiazepines going forward at 25 to 50 mg 3 times daily as needed  Palliative Prophylaxis:   Aspiration, Bowel Regimen, Delirium Protocol, Eye Care, Frequent Pain Assessment, Oral Care and Turn Reposition  Additional Recommendations (Limitations,  Scope, Preferences):  Full Scope Treatment  Psycho-social/Spiritual:   Desire for further Chaplaincy support:no  Additional Recommendations: Referral to Community Resources   Prognosis:   Unable to determine  Discharge Planning: Skilled Nursing Facility for rehab with Palliative care service follow-up      Primary Diagnoses: Present on Admission: . UTI (urinary tract infection) . Alcoholic cirrhosis of liver with ascites (HCC)   I have reviewed the medical record, interviewed the patient and family, and examined the patient. The following aspects are pertinent.  Past Medical History:  Diagnosis Date  . ADHD (attention deficit hyperactivity disorder)   .  Alcoholic cirrhosis (HCC)    with ascites  . Alcoholism (HCC)   . Allergies   . Anxiety   . Arthritis   . Atrial fibrillation with RVR (HCC)   . Bipolar disorder (HCC)   . COPD (chronic obstructive pulmonary disease) (HCC)    wears 2L chronic O2  . Dyspnea   . EtOH dependence (HCC) 05/14/2018  . GERD (gastroesophageal reflux disease)   . History of hiatal hernia   . Hypokalemia   . Migraine   . Multiple sclerosis (HCC)   . Narcolepsy   . Osteoarthritis of knee   . Osteoporosis   . Pneumonia   . UTI (urinary tract infection) 08/11/2018   Social History   Socioeconomic History  . Marital status: Divorced    Spouse name: n/a  . Number of children: 5  . Years of education: College  . Highest education level: Not on file  Occupational History  . Occupation: disabled    Comment: on social security disability for MS  Social Needs  . Financial resource strain: Somewhat hard  . Food insecurity    Worry: Never true    Inability: Never true  . Transportation needs    Medical: No    Non-medical: No  Tobacco Use  . Smoking status: Current Every Day Smoker    Packs/day: 1.00    Years: 36.00    Pack years: 36.00    Types: Cigarettes  . Smokeless tobacco: Never Used  Substance and Sexual Activity  . Alcohol use: Yes    Comment: Chronic ETOH abuse  . Drug use: No  . Sexual activity: Not Currently  Lifestyle  . Physical activity    Days per week: 0 days    Minutes per session: 0 min  . Stress: Rather much  Relationships  . Social connections    Talks on phone: More than three times a week    Gets together: Three times a week    Attends religious service: Never    Active member of club or organization: No    Attends meetings of clubs or organizations: Never    Relationship status: Divorced  Other Topics Concern  . Not on file  Social History Narrative   Former Environmental consultantpharmaceutical representative.   Started graduate school in counseling, but didn't finish when a good job  came open.   Lives alone.   One daughter lives in [place].   Another daughter is in [place]    The other three live in [place], along with her mother.   [place] in the [place] area.   Family History  Problem Relation Age of Onset  . Arrhythmia Mother   . Neuropathy Mother   . Coronary artery disease Mother   . Heart disease Mother   . Heart disease Father   . Hypertension Father   . Heart attack Father   . Multiple  sclerosis Maternal Aunt    Scheduled Meds: . folic acid  1 mg Oral Daily  . furosemide  40 mg Oral Daily  . gabapentin  200 mg Oral TID  . lactulose  10 g Oral BID  . LORazepam  0-4 mg Intravenous Q6H   Followed by  . [START ON 08/13/2018] LORazepam  0-4 mg Intravenous Q12H  . multivitamin with minerals  1 tablet Oral Daily  . nicotine  21 mg Transdermal Daily  . spironolactone  25 mg Oral Daily  . thiamine  100 mg Oral Daily   Or  . thiamine  100 mg Intravenous Daily   Continuous Infusions: . sodium chloride 1,000 mL (08/11/18 0914)  . ceFEPime (MAXIPIME) IV 2 g (08/12/18 0948)   PRN Meds:.sodium chloride, acetaminophen **OR** acetaminophen, ipratropium-albuterol, lactulose **AND** lactulose, LORazepam **OR** LORazepam, ondansetron (ZOFRAN) IV Medications Prior to Admission:  Prior to Admission medications   Medication Sig Start Date End Date Taking? Authorizing Provider  acetaminophen (TYLENOL) 500 MG tablet Take 1 tablet (500 mg total) by mouth every 6 (six) hours as needed for mild pain. Patient taking differently: Take 1,000 mg by mouth every 6 (six) hours as needed for mild pain.  03/28/18   Rolly SalterPatel, Pranav M, MD  diltiazem (CARDIZEM CD) 120 MG 24 hr capsule Take 1 capsule (120 mg total) by mouth daily for 30 days. 04/14/18 08/05/18  Tegeler, Canary Brimhristopher J, MD  famotidine (PEPCID) 20 MG tablet Take 1 tablet (20 mg total) by mouth daily for 14 days. 07/24/18 08/07/18  Caccavale, Sophia, PA-C  furosemide (LASIX) 40 MG tablet Take 1 tablet (40 mg total) by  mouth daily. 06/27/18   Sabas SousBero, Michael M, MD  gabapentin (NEURONTIN) 100 MG capsule Take 2 capsules (200 mg total) by mouth 3 (three) times daily for 30 days. 07/19/18 08/18/18  Fayrene Helperran, Bowie, PA-C  hydrOXYzine (ATARAX/VISTARIL) 25 MG tablet Take 1 tablet (25 mg total) by mouth daily as needed for up to 30 doses (anxiety). 04/14/18   Tegeler, Canary Brimhristopher J, MD  methocarbamol (ROBAXIN) 500 MG tablet Take 1 tablet (500 mg total) by mouth 2 (two) times daily as needed for muscle spasms. 06/27/18   Sabas SousBero, Michael M, MD  ondansetron (ZOFRAN) 4 MG tablet Take 4 mg by mouth every 8 (eight) hours as needed for nausea or vomiting.    [provider]  ondansetron (ZOFRAN) 4 MG tablet Take 1 tablet (4 mg total) by mouth every 6 (six) hours. 08/05/18   Lawyer, Cristal Deerhristopher, PA-C  pantoprazole (PROTONIX) 20 MG tablet Take 2 tablets (40 mg total) by mouth daily. 07/29/18   Mesner, Barbara CowerJason, MD  pantoprazole (PROTONIX) 20 MG tablet Take 1 tablet (20 mg total) by mouth daily. 08/05/18   Lawyer, Cristal Deerhristopher, PA-C  potassium chloride SA (K-DUR,KLOR-CON) 20 MEQ tablet Take 2 tablets (40 mEq total) by mouth 2 (two) times daily. 04/19/18   Bethel BornGekas, Kelly Marie, PA-C  spironolactone (ALDACTONE) 25 MG tablet Take 1 tablet (25 mg total) by mouth daily for 30 days. 06/27/18 08/10/18  Sabas SousBero, Michael M, MD   No Known Allergies Review of Systems  Unable to perform ROS: Mental status change    Physical Exam Vitals signs and nursing note reviewed.  Constitutional:      Appearance: She is ill-appearing.     Comments: Alert, oriented to person place and situation  HENT:     Head: Normocephalic and atraumatic.  Neck:     Musculoskeletal: Normal range of motion.  Cardiovascular:     Rate and  Rhythm: Normal rate.  Pulmonary:     Comments: No increased work of breathing at rest Wearing oxygen Musculoskeletal: Normal range of motion.  Skin:    General: Skin is warm and dry.  Neurological:     Mental Status: She is oriented to person,  place, and time.     Motor: Weakness present.  Psychiatric:     Comments: Anxious Thought processes circumstantial     Vital Signs: BP (!) 112/53 (BP Location: Right Arm)   Pulse 90   Temp 98.1 F (36.7 C) (Oral)   Resp 17   Ht 5\' 2"  (1.575 m)   Wt 93.1 kg   SpO2 100%   BMI 37.54 kg/m  Pain Scale: 0-10   Pain Score: 0-No pain   SpO2: SpO2: 100 % O2 Device:SpO2: 100 % O2 Flow Rate: .O2 Flow Rate (L/min): 2 L/min  IO: Intake/output summary:   Intake/Output Summary (Last 24 hours) at 08/12/2018 1151 Last data filed at 08/12/2018 1033 Gross per 24 hour  Intake 1501.44 ml  Output 4200 ml  Net -2698.56 ml    LBM: Last BM Date: (PTA) Baseline Weight: Weight: 92.7 kg Most recent weight: Weight: 93.1 kg     Palliative Assessment/Data:   Flowsheet Rows     Most Recent Value  Intake Tab  Referral Department  Hospitalist  Unit at Time of Referral  Med/Surg Unit  Palliative Care Primary Diagnosis  Sepsis/Infectious Disease  Date Notified  08/11/18  Palliative Care Type  New Palliative care  Reason for referral  Clarify Goals of Care, Psychosocial or Spiritual support  Date of Admission  08/10/18  Date first seen by Palliative Care  08/12/18  # of days Palliative referral response time  1 Day(s)  # of days IP prior to Palliative referral  1  Clinical Assessment  Palliative Performance Scale Score  40%  Pain Max last 24 hours  Not able to report  Pain Min Last 24 hours  Not able to report  Dyspnea Max Last 24 Hours  Not able to report  Dyspnea Min Last 24 hours  Not able to report  Nausea Max Last 24 Hours  Not able to report  Nausea Min Last 24 Hours  Not able to report  Anxiety Max Last 24 Hours  Not able to report  Anxiety Min Last 24 Hours  Not able to report  Other Max Last 24 Hours  Not able to report  Psychosocial & Spiritual Assessment  Palliative Care Outcomes  Patient/Family meeting held?  Yes  Who was at the meeting?  pt  Palliative Care follow-up  planned  No      Time In: 0900 Time Out: 1010 Time Total: 70 min Greater than 50%  of this time was spent counseling and coordinating care related to the above assessment and plan.  Signed by: Dory Horn, NP   Please contact Palliative Medicine Team phone at [phone] for questions and concerns.  For individual provider: See Shea Evans

## 2018-08-12 NOTE — Progress Notes (Signed)
Received pt. post Paracentesis, R lower abdominal dressing, with blood stain, marked. Pt. Still drowsy at this time. Will monitor accordingly.

## 2018-08-13 LAB — BASIC METABOLIC PANEL
Anion gap: 7 (ref 5–15)
BUN: 7 mg/dL (ref 6–20)
CO2: 30 mmol/L (ref 22–32)
Calcium: 8 mg/dL — ABNORMAL LOW (ref 8.9–10.3)
Chloride: 93 mmol/L — ABNORMAL LOW (ref 98–111)
Creatinine, Ser: 0.56 mg/dL (ref 0.44–1.00)
GFR calc Af Amer: >60 mL/min (ref >60)
GFR calc non Af Amer: >60 mL/min (ref >60)
Glucose, Bld: 83 mg/dL (ref 70–99)
Potassium: 4.1 mmol/L (ref 3.5–5.1)
Sodium: 130 mmol/L — ABNORMAL LOW (ref 135–145)

## 2018-08-13 LAB — MAGNESIUM: Magnesium: 1.9 mg/dL (ref 1.7–2.4)

## 2018-08-13 MED ORDER — ALUM & MAG HYDROXIDE-SIMETH 200-200-20 MG/5ML PO SUSP
15.0000 mL | Freq: Four times a day (QID) | ORAL | Status: DC | PRN
  Administered 2018-08-13 – 2018-08-20 (×4): 15 mL via ORAL
  Filled 2018-08-13 (×4): qty 30

## 2018-08-13 MED ORDER — PANTOPRAZOLE SODIUM 40 MG PO TBEC
40.0000 mg | DELAYED_RELEASE_TABLET | Freq: Every day | ORAL | Status: DC
  Administered 2018-08-13 – 2018-08-20 (×8): 40 mg via ORAL
  Filled 2018-08-13 (×8): qty 1

## 2018-08-13 MED ORDER — CEPHALEXIN 250 MG PO CAPS
500.0000 mg | ORAL_CAPSULE | Freq: Two times a day (BID) | ORAL | Status: AC
  Administered 2018-08-13 – 2018-08-16 (×6): 500 mg via ORAL
  Filled 2018-08-13 (×6): qty 2

## 2018-08-13 NOTE — Progress Notes (Signed)
Complains of heart burn, Maalox ordered

## 2018-08-13 NOTE — Progress Notes (Signed)
No need to call family for updates. Will call if patient changes her mind.

## 2018-08-13 NOTE — Progress Notes (Signed)
Requests Protonix, MD aware.Medication ordered

## 2018-08-13 NOTE — Progress Notes (Signed)
[facility] Progress Note  Patient: Anne Black ZOX:[id number]   PCP: Patient, No Pcp Per DOB: 06/19/1960   DOA: 08/10/2018   DOS: 08/13/2018   Date of Service: the patient was seen and examined on 08/13/2018  Brief hospital course: Pt. with PMH of alcoholic cirrhosis with varices and chronic ascites, COPD, bipolar disorder, MS, paroxysmal atrial fibrillation not anticoagulated, smoking, chronic respiratory failure on 2-3 L home oxygen; admitted on 08/10/2018, presented with complaint of shortness of breath, was found to have recurrent ascites. Currently further plan is follow-up on paracentesis.  Subjective: Reports indigestion.  Still has abdominal distention.  No nausea no vomiting no fever no chills.  Assessment and Plan: Sepsis secondary to UTI, possible SBP. No pneumonia no cellulitis. Hypothermic, hypotensive, and tachycardic on arrival.  Lactic acid 2.4.  No leukocytosis.  UA suggestive of infection.  Chest x-ray with retrocardiac opacity concerning for possible infiltrate. Noted to have mild bilateral lower extremity erythema and warmth to touch on exam. -Received 1 L IV fluid bolus and blood pressure improved Started on IV cefepime and vancomycin. We will transition to oral Keflex to cover for urine as well as abdomen concerning for SBP.  So far cultures are negative. Blood cultures are growing 1 out of 4 likely contamination. Urine culture negative.  Acute on chronic hyponatremia Sodium 123, chronically low in the setting of liver cirrhosis.  Sodium was ranging between 124-130 last month.   -Received IV fluid for hypotension and sepsis.  Unable to diurese at this time. -Continue to monitor sodium level  AKI Creatinine 1.0, baseline 0.3-0.4.  Prerenal from sepsis/hypotension versus cardiorenal/ hepatorenal. -Received IV fluid for hypotension and sepsis -Repeat BMP in a.m. -Avoid nephrotoxic agents -Monitor urine output  Decompensated alcoholic cirrhosis with ascites  Underwent diagnostic paracentesis in the ED and only 100 cc peritoneal fluid removed.  Gram stain showing no organisms. -Peritoneal fluid culture pending -Abdomen appears distended.   Underwent paracentesis.  Procedure had to be stopped due to hypotension.  Will provide IV albumin every 6 hours x2. Continue Lasix and Aldactone.  Brisk diuresis noted.  May require albumin support again.  Hepatic encephalopathy Ammonia level 66.  Appears slightly confused and not completely oriented. -Lactulose  Acutely decompensated diastolic congestive heart failure Currently on 2 L supplemental oxygen, uses 2 to 3 L at home.  Not tachypneic.  Chest x-ray showing cardiomegaly with findings suggestive of worsening volume overload.  Probable trace to small bilateral pleural effusions. Echo done January 2020 showing LVEF 65 to 70% and grade 1 diastolic dysfunction. -Unable to diurese at this time given sepsis/hypotension  Physical deconditioning -PT evaluation  Paroxysmal atrial fibrillation CHA2DS2VASc 2.  Per cardiology documentation, not a candidate for long-term anticoagulation given active alcohol abuse.  Alcohol use disorder No signs of withdrawal at this time. -CIWA protocol; Ativan PRN -Thiamine, folate, multivitamin -Blood ethanol level pending  COPD -Stable.  No wheezing.  DuoNebs PRN.  History of multiple sclerosis Currently not on any medications.  Last neurology visit in March 2020. -Ensure outpatient follow-up  Tobacco use -NicoDerm patch -Counseled to quit  Diet: Cardiac diet DVT Prophylaxis: SCD, pharmacological prophylaxis contraindicated due to Anemia and cirrhosis  Advance goals of care discussion: Full code, palliative care consulted for goals of care discussion.  Family Communication: no family was present at bedside, at the time of interview.   Disposition:  Discharge to SNF .  Consultants: IR Procedures: none  Scheduled Meds: . cephALEXin  500 mg Oral  Q12H  .  folic acid  1 mg Oral Daily  . furosemide  40 mg Oral Daily  . gabapentin  200 mg Oral TID  . lactulose  10 g Oral BID  . LORazepam  0-4 mg Intravenous Q12H  . multivitamin with minerals  1 tablet Oral Daily  . nicotine  21 mg Transdermal Daily  . pantoprazole  40 mg Oral Daily  . spironolactone  25 mg Oral Daily  . thiamine  100 mg Oral Daily   Or  . thiamine  100 mg Intravenous Daily   Continuous Infusions: . sodium chloride 1,000 mL (08/13/18 0305)   PRN Meds: sodium chloride, acetaminophen **OR** acetaminophen, alum & mag hydroxide-simeth, ipratropium-albuterol, lactulose **AND** lactulose, LORazepam **OR** LORazepam, ondansetron (ZOFRAN) IV Antibiotics: Anti-infectives (From admission, onward)   Start     Dose/Rate Route Frequency Ordered Stop   08/13/18 1000  cephALEXin (KEFLEX) capsule 500 mg     500 mg Oral Every 12 hours 08/13/18 0749 08/16/18 0959   08/12/18 0600  vancomycin (VANCOCIN) IVPB 750 mg/150 ml premix  Status:  Discontinued     750 mg 150 mL/hr over 60 Minutes Intravenous Every 24 hours 08/11/18 0107 08/11/18 0828   08/11/18 1800  ceFEPIme (MAXIPIME) 2 g in sodium chloride 0.9 % 100 mL IVPB  Status:  Discontinued     2 g 200 mL/hr over 30 Minutes Intravenous Every 8 hours 08/11/18 1315 08/13/18 0749   08/11/18 1000  ceFEPIme (MAXIPIME) 1 g in sodium chloride 0.9 % 100 mL IVPB  Status:  Discontinued     1 g 200 mL/hr over 30 Minutes Intravenous Every 8 hours 08/11/18 0107 08/11/18 1315   08/10/18 2115  ceFEPIme (MAXIPIME) 2 g in sodium chloride 0.9 % 100 mL IVPB     2 g 200 mL/hr over 30 Minutes Intravenous  Once 08/10/18 2107 08/11/18 0148   08/10/18 2115  vancomycin (VANCOCIN) 1,500 mg in sodium chloride 0.9 % 500 mL IVPB     1,500 mg 250 mL/hr over 120 Minutes Intravenous  Once 08/10/18 2107 08/11/18 0100       Objective: Physical Exam: Vitals:   08/12/18 2022 08/13/18 0558 08/13/18 0842 08/13/18 1736  BP: (!) 94/54 104/63 (!) 115/55    Pulse: 90 88 87   Resp: 19 18 18 20   Temp: 97.9 F (36.6 C) 98.2 F (36.8 C)    TempSrc: Axillary Oral    SpO2: 99% 100% 92%   Weight:  86.7 kg    Height:        Intake/Output Summary (Last 24 hours) at 08/13/2018 1851 Last data filed at 08/13/2018 1736 Gross per 24 hour  Intake 775.91 ml  Output 2500 ml  Net -1724.09 ml   Filed Weights   08/11/18 0603 08/12/18 0604 08/13/18 0558  Weight: 92.7 kg 93.1 kg 86.7 kg   General: alert and oriented to time, place, and person. Appear in mild distress, affect tearful Eyes: PERRL, Conjunctiva normal ENT: Oral Mucosa Clear, moist  Neck: difficult to assess  JVD, ono Abnormal Mass Or lumps Cardiovascular: S1 and S2 Present, no Murmur, peripheral pulses symmetrical Respiratory: normal respiratory effort, Bilateral Air entry equal and Decreased, no use of accessory muscle, Clear to Auscultation, no Crackles, no wheezes Abdomen: Bowel Sound present, Soft and mild tenderness, no hernia Skin: no rashes  Extremities: no Pedal edema, no calf tenderness Neurologic: normal without focal findings, mental status, speech normal, alert and oriented x3, PERLA, Motor strength 5/5 and symmetric and sensation grossly normal to light touch  Gait not checked due to patient safety concerns  Data Reviewed: CBC: Recent Labs  Lab 08/10/18 1803 08/11/18 0414 08/12/18 0420  WBC 5.4 5.1 4.2  NEUTROABS 3.7  --  2.9  HGB 8.5* 7.6* 8.0*  HCT 27.2* 24.5* 26.3*  MCV 80.5 79.3* 83.2  PLT 114* 103* 81*   Basic Metabolic Panel: Recent Labs  Lab 08/10/18 1803 08/11/18 0414 08/12/18 0420 08/13/18 0822  NA 123* 124* 128* 130*  K 4.3 4.2 4.5 4.1  CL 85* 89* 92* 93*  CO2 27 28 30 30   GLUCOSE 101* 98 93 83  BUN 7 8 6 7   CREATININE 1.05* 0.86 0.62 0.56  CALCIUM 7.6* 7.5* 7.9* 8.0*  MG  --   --  2.0 1.9    Liver Function Tests: Recent Labs  Lab 08/10/18 1803 08/11/18 0414 08/12/18 0420  AST 49* 46* 46*  ALT 25 22 22   ALKPHOS 151* 146* 145*  BILITOT  3.0* 2.9* 4.4*  PROT 5.9* 5.4* 5.6*  ALBUMIN 2.1* 2.0* 2.0*   Recent Labs  Lab 08/10/18 1803  LIPASE 35   Recent Labs  Lab 08/10/18 1805  AMMONIA 66*   Coagulation Profile: Recent Labs  Lab 08/10/18 1803  INR 1.7*   Cardiac Enzymes: No results for input(s): CKTOTAL, CKMB, CKMBINDEX, TROPONINI in the last 168 hours. BNP (last 3 results) No results for input(s): PROBNP in the last 8760 hours. CBG: No results for input(s): GLUCAP in the last 168 hours. Studies: No results found.   Time spent: 35 minutes  Author: Lynden OxfordPranav Khamari Sheehan, MD [facility] 08/13/2018 6:51 PM  To reach On-call, see care teams to locate the attending and reach out to them via [email or web address]. If 7PM-7AM, please contact night-coverage If you still have difficulty reaching the attending provider, please page the [facility] (Director on Call) for [facility] on amion for assistance.

## 2018-08-14 ENCOUNTER — Inpatient Hospital Stay (HOSPITAL_COMMUNITY): Payer: Medicare Other

## 2018-08-14 ENCOUNTER — Encounter (HOSPITAL_COMMUNITY): Payer: Self-pay | Admitting: Student

## 2018-08-14 HISTORY — PX: IR PARACENTESIS: IMG2679

## 2018-08-14 LAB — BASIC METABOLIC PANEL
Anion gap: 7 (ref 5–15)
BUN: 7 mg/dL (ref 6–20)
CO2: 33 mmol/L — ABNORMAL HIGH (ref 22–32)
Calcium: 7.9 mg/dL — ABNORMAL LOW (ref 8.9–10.3)
Chloride: 92 mmol/L — ABNORMAL LOW (ref 98–111)
Creatinine, Ser: 0.83 mg/dL (ref 0.44–1.00)
GFR calc Af Amer: >60 mL/min (ref >60)
GFR calc non Af Amer: >60 mL/min (ref >60)
Glucose, Bld: 117 mg/dL — ABNORMAL HIGH (ref 70–99)
Potassium: 3.7 mmol/L (ref 3.5–5.1)
Sodium: 132 mmol/L — ABNORMAL LOW (ref 135–145)

## 2018-08-14 LAB — MAGNESIUM: Magnesium: 1.7 mg/dL (ref 1.7–2.4)

## 2018-08-14 MED ORDER — LIDOCAINE HCL 1 % IJ SOLN
INTRAMUSCULAR | Status: AC
  Filled 2018-08-14: qty 20

## 2018-08-14 MED ORDER — LIDOCAINE HCL 1 % IJ SOLN
INTRAMUSCULAR | Status: DC | PRN
  Administered 2018-08-14: 10 mL

## 2018-08-14 MED ORDER — ALBUMIN HUMAN 25 % IV SOLN
25.0000 g | Freq: Four times a day (QID) | INTRAVENOUS | Status: DC
  Administered 2018-08-14: 25 g via INTRAVENOUS
  Filled 2018-08-14 (×2): qty 100

## 2018-08-14 MED ORDER — GERHARDT'S BUTT CREAM
TOPICAL_CREAM | Freq: Two times a day (BID) | CUTANEOUS | Status: DC
  Administered 2018-08-15 – 2018-08-20 (×12): via TOPICAL
  Filled 2018-08-14: qty 1

## 2018-08-14 MED ORDER — LORAZEPAM 0.5 MG PO TABS
0.5000 mg | ORAL_TABLET | Freq: Four times a day (QID) | ORAL | Status: DC | PRN
  Administered 2018-08-14 – 2018-08-20 (×12): 0.5 mg via ORAL
  Filled 2018-08-14 (×13): qty 1

## 2018-08-14 MED ORDER — LORAZEPAM 2 MG/ML IJ SOLN
0.0000 mg | Freq: Two times a day (BID) | INTRAMUSCULAR | Status: AC
  Administered 2018-08-14: 1 mg via INTRAVENOUS
  Filled 2018-08-14: qty 1

## 2018-08-14 MED ORDER — GABAPENTIN 300 MG PO CAPS
300.0000 mg | ORAL_CAPSULE | Freq: Three times a day (TID) | ORAL | Status: DC
  Administered 2018-08-14 – 2018-08-20 (×18): 300 mg via ORAL
  Filled 2018-08-14 (×18): qty 1

## 2018-08-14 MED ORDER — ALBUMIN HUMAN 25 % IV SOLN
50.0000 g | Freq: Four times a day (QID) | INTRAVENOUS | Status: AC
  Administered 2018-08-14 – 2018-08-15 (×2): 50 g via INTRAVENOUS
  Filled 2018-08-14 (×3): qty 200

## 2018-08-14 NOTE — Procedures (Signed)
PROCEDURE SUMMARY:  Successful image-guided paracentesis from the right lateral abdomen.  Yielded 5.7 liters of clear gold fluid.  No immediate complications.  EBL = 0 mL. Patient tolerated well.   Specimen was not sent for labs.  Claris Pong Partick Musselman PA-C 08/14/2018 11:57 AM

## 2018-08-14 NOTE — Plan of Care (Signed)
  Problem: Health Behavior/Discharge Planning: Goal: Ability to manage health-related needs will improve Outcome: Progressing   Problem: Clinical Measurements: Goal: Will remain free from infection Outcome: Progressing Goal: Respiratory complications will improve Outcome: Progressing   Problem: Elimination: Goal: Will not experience complications related to bowel motility Outcome: Progressing

## 2018-08-14 NOTE — Progress Notes (Signed)
[facility] Progress Note  Patient: Anne Black:[id number]   PCP: Patient, No Pcp Per DOB: Apr 08, 1960   DOA: 08/10/2018   DOS: 08/14/2018   Date of Service: the patient was seen and examined on 08/14/2018  Brief hospital course: Pt. with PMH of alcoholic cirrhosis with varices and chronic ascites, COPD, bipolar disorder, MS, paroxysmal atrial fibrillation not anticoagulated, smoking, chronic respiratory failure on 2-3 L home oxygen; admitted on 08/10/2018, presented with complaint of shortness of breath, was found to have recurrent ascites. Currently further plan is follow-up on paracentesis.  Subjective: No acute complaint.  No nausea no vomiting.  Still distended abdomen.  Still short of breath.  Assessment and Plan: Sepsis secondary to UTI, possible SBP. No pneumonia no cellulitis. Hypothermic, hypotensive, and tachycardic on arrival.  Lactic acid 2.4.  No leukocytosis.  UA suggestive of infection.  Chest x-ray with retrocardiac opacity concerning for possible infiltrate. Noted to have mild bilateral lower extremity erythema and warmth to touch on exam. -Received 1 L IV fluid bolus and blood pressure improved Started on IV cefepime and vancomycin. We will transition to oral Keflex to cover for urine as well as abdomen concerning for SBP.  So far cultures are negative. Blood cultures are growing 1 out of 4 likely contamination. Urine culture negative.  Acute on chronic hyponatremia Sodium 123, chronically low in the setting of liver cirrhosis.  Sodium was ranging between 124-130 last month.   -Received IV fluid for hypotension and sepsis.  Unable to diurese at this time. -Continue to monitor sodium level  AKI Creatinine 1.0, baseline 0.3-0.4.  Prerenal from sepsis/hypotension versus cardiorenal/ hepatorenal. -Received IV fluid for hypotension and sepsis -Repeat BMP in a.m. -Avoid nephrotoxic agents -Monitor urine output  Decompensated alcoholic cirrhosis with ascites  Underwent diagnostic paracentesis in the ED and only 100 cc peritoneal fluid removed.  Gram stain showing no organisms. -Peritoneal fluid culture pending -Abdomen appears distended.   Underwent paracentesis x2.  3 L on 5 L. Will provide IV albumin every 6 hours x2. Continue Lasix and Aldactone.  Brisk diuresis noted.   Continue albumin post paracentesis.   Hepatic encephalopathy Ammonia level 66.  Appears slightly confused and not completely oriented. -Lactulose  Acutely decompensated diastolic congestive heart failure Currently on 2 L supplemental oxygen, uses 2 to 3 L at home.  Not tachypneic.  Chest x-ray showing cardiomegaly with findings suggestive of worsening volume overload.  Probable trace to small bilateral pleural effusions. Echo done January 2020 showing LVEF 65 to 70% and grade 1 diastolic dysfunction. -Unable to diurese at this time given sepsis/hypotension  Physical deconditioning -PT evaluation  Paroxysmal atrial fibrillation CHA2DS2VASc 2.  Per cardiology documentation, not a candidate for long-term anticoagulation given active alcohol abuse.  Alcohol use disorder No signs of withdrawal at this time. -CIWA protocol; Ativan PRN -Thiamine, folate, multivitamin -Blood ethanol level pending  COPD -Stable.  No wheezing.  DuoNebs PRN.  History of multiple sclerosis Currently not on any medications.  Last neurology visit in March 2020. -Ensure outpatient follow-up  Tobacco use -NicoDerm patch -Counseled to quit  Diet: Cardiac diet DVT Prophylaxis: SCD, pharmacological prophylaxis contraindicated due to Anemia and cirrhosis  Advance goals of care discussion: Full code, palliative care consulted for goals of care discussion.  Family Communication: no family was present at bedside, at the time of interview.   Disposition:  Discharge to SNF .  Consultants: IR Procedures: none  Scheduled Meds: . cephALEXin  500 mg Oral Q12H  . folic acid  1 mg Oral  Daily  . furosemide  40 mg Oral Daily  . gabapentin  300 mg Oral TID  . Gerhardt's butt cream   Topical BID  . lactulose  10 g Oral BID  . lidocaine      . multivitamin with minerals  1 tablet Oral Daily  . nicotine  21 mg Transdermal Daily  . pantoprazole  40 mg Oral Daily  . spironolactone  25 mg Oral Daily  . thiamine  100 mg Oral Daily   Or  . thiamine  100 mg Intravenous Daily   Continuous Infusions: . sodium chloride 1,000 mL (08/13/18 0305)  . albumin human 50 g (08/14/18 1743)   PRN Meds: sodium chloride, acetaminophen **OR** acetaminophen, alum & mag hydroxide-simeth, ipratropium-albuterol, lactulose **AND** lactulose, lidocaine, LORazepam, ondansetron (ZOFRAN) IV Antibiotics: Anti-infectives (From admission, onward)   Start     Dose/Rate Route Frequency Ordered Stop   08/13/18 1000  cephALEXin (KEFLEX) capsule 500 mg     500 mg Oral Every 12 hours 08/13/18 0749 08/16/18 0959   08/12/18 0600  vancomycin (VANCOCIN) IVPB 750 mg/150 ml premix  Status:  Discontinued     750 mg 150 mL/hr over 60 Minutes Intravenous Every 24 hours 08/11/18 0107 08/11/18 0828   08/11/18 1800  ceFEPIme (MAXIPIME) 2 g in sodium chloride 0.9 % 100 mL IVPB  Status:  Discontinued     2 g 200 mL/hr over 30 Minutes Intravenous Every 8 hours 08/11/18 1315 08/13/18 0749   08/11/18 1000  ceFEPIme (MAXIPIME) 1 g in sodium chloride 0.9 % 100 mL IVPB  Status:  Discontinued     1 g 200 mL/hr over 30 Minutes Intravenous Every 8 hours 08/11/18 0107 08/11/18 1315   08/10/18 2115  ceFEPIme (MAXIPIME) 2 g in sodium chloride 0.9 % 100 mL IVPB     2 g 200 mL/hr over 30 Minutes Intravenous  Once 08/10/18 2107 08/11/18 0148   08/10/18 2115  vancomycin (VANCOCIN) 1,500 mg in sodium chloride 0.9 % 500 mL IVPB     1,500 mg 250 mL/hr over 120 Minutes Intravenous  Once 08/10/18 2107 08/11/18 0100       Objective: Physical Exam: Vitals:   08/14/18 0537 08/14/18 1121 08/14/18 1203 08/14/18 1225  BP: (!) 105/58 (!)  99/56 (!) 107/59 (!) 101/59  Pulse: (!) 101 89    Resp: 18 18    Temp: 97.6 F (36.4 C) 97.8 F (36.6 C)    TempSrc:  Oral    SpO2: 100% 99%    Weight:      Height:        Intake/Output Summary (Last 24 hours) at 08/14/2018 1829 Last data filed at 08/14/2018 1300 Gross per 24 hour  Intake 1200 ml  Output 250 ml  Net 950 ml   Filed Weights   08/12/18 0604 08/13/18 0558 08/14/18 0537  Weight: 93.1 kg 86.7 kg 89.2 kg   General: alert and oriented to time, place, and person. Appear in mild distress, affect tearful Eyes: PERRL, Conjunctiva normal ENT: Oral Mucosa Clear, moist  Neck: difficult to assess  JVD, ono Abnormal Mass Or lumps Cardiovascular: S1 and S2 Present, no Murmur, peripheral pulses symmetrical Respiratory: normal respiratory effort, Bilateral Air entry equal and Decreased, no use of accessory muscle, Clear to Auscultation, no Crackles, no wheezes Abdomen: Bowel Sound present, Soft and mild tenderness, no hernia Skin: no rashes  Extremities: no Pedal edema, no calf tenderness Neurologic: normal without focal findings, mental status, speech normal, alert and  oriented x3, PERLA, Motor strength 5/5 and symmetric and sensation grossly normal to light touch Gait not checked due to patient safety concerns  Data Reviewed: CBC: Recent Labs  Lab 08/10/18 1803 08/11/18 0414 08/12/18 0420  WBC 5.4 5.1 4.2  NEUTROABS 3.7  --  2.9  HGB 8.5* 7.6* 8.0*  HCT 27.2* 24.5* 26.3*  MCV 80.5 79.3* 83.2  PLT 114* 103* 81*   Basic Metabolic Panel: Recent Labs  Lab 08/10/18 1803 08/11/18 0414 08/12/18 0420 08/13/18 0822 08/14/18 1046  NA 123* 124* 128* 130* 132*  K 4.3 4.2 4.5 4.1 3.7  CL 85* 89* 92* 93* 92*  CO2 27 28 30 30  33*  GLUCOSE 101* 98 93 83 117*  BUN 7 8 6 7 7   CREATININE 1.05* 0.86 0.62 0.56 0.83  CALCIUM 7.6* 7.5* 7.9* 8.0* 7.9*  MG  --   --  2.0 1.9 1.7    Liver Function Tests: Recent Labs  Lab 08/10/18 1803 08/11/18 0414 08/12/18 0420  AST 49* 46*  46*  ALT 25 22 22   ALKPHOS 151* 146* 145*  BILITOT 3.0* 2.9* 4.4*  PROT 5.9* 5.4* 5.6*  ALBUMIN 2.1* 2.0* 2.0*   Recent Labs  Lab 08/10/18 1803  LIPASE 35   Recent Labs  Lab 08/10/18 1805  AMMONIA 66*   Coagulation Profile: Recent Labs  Lab 08/10/18 1803  INR 1.7*   Cardiac Enzymes: No results for input(s): CKTOTAL, CKMB, CKMBINDEX, TROPONINI in the last 168 hours. BNP (last 3 results) No results for input(s): PROBNP in the last 8760 hours. CBG: No results for input(s): GLUCAP in the last 168 hours. Studies: Ir Paracentesis  Result Date: 08/14/2018 INDICATION: Patient with history of alcoholic cirrhosis with recurrent ascites. Request is made for therapeutic paracentesis. EXAM: ULTRASOUND GUIDED THERAPEUTIC PARACENTESIS MEDICATIONS: 10 mL 1% lidocaine COMPLICATIONS: None immediate. PROCEDURE: Informed written consent was obtained from the patient after a discussion of the risks, benefits and alternatives to treatment. A timeout was performed prior to the initiation of the procedure. Initial ultrasound scanning demonstrates a large amount of ascites within the right lower abdominal quadrant. The right lower abdomen was prepped and draped in the usual sterile fashion. 1% lidocaine was used for local anesthesia. Following this, a 19 gauge, 7-cm, Yueh catheter was introduced. An ultrasound image was saved for documentation purposes. The paracentesis was performed. The catheter was removed and a dressing was applied. The patient tolerated the procedure well without immediate post procedural complication. FINDINGS: A total of approximately 5.7 L of clear gold fluid was removed. IMPRESSION: Successful ultrasound-guided paracentesis yielding 5.7 L of peritoneal fluid. Read by: Elwin MochaAlexandra Louk, PA-C Electronically Signed   By: Richarda OverlieAdam  Henn M.D.   On: 08/14/2018 12:27     Time spent: 35 minutes  Author: Lynden OxfordPranav Geoffry Bannister, MD [facility] 08/14/2018 6:29 PM  To reach On-call, see care teams  to locate the attending and reach out to them via [email or web address]. If 7PM-7AM, please contact night-coverage If you still have difficulty reaching the attending provider, please page the [facility] (Director on Call) for [facility] on amion for assistance.

## 2018-08-14 NOTE — Care Management Important Message (Signed)
Important Message  Patient Details  Name: Anne Black MRN: [id number] Date of Birth: 1960-03-27   Medicare Important Message Given:  Yes     Shelda Altes 08/14/2018, 4:13 PM

## 2018-08-15 LAB — BASIC METABOLIC PANEL
Anion gap: 8 (ref 5–15)
BUN: 8 mg/dL (ref 6–20)
CO2: 35 mmol/L — ABNORMAL HIGH (ref 22–32)
Calcium: 8.5 mg/dL — ABNORMAL LOW (ref 8.9–10.3)
Chloride: 89 mmol/L — ABNORMAL LOW (ref 98–111)
Creatinine, Ser: 0.8 mg/dL (ref 0.44–1.00)
GFR calc Af Amer: >60 mL/min (ref >60)
GFR calc non Af Amer: >60 mL/min (ref >60)
Glucose, Bld: 126 mg/dL — ABNORMAL HIGH (ref 70–99)
Potassium: 3.2 mmol/L — ABNORMAL LOW (ref 3.5–5.1)
Sodium: 132 mmol/L — ABNORMAL LOW (ref 135–145)

## 2018-08-15 LAB — CULTURE, BODY FLUID W GRAM STAIN -BOTTLE: Culture: NO GROWTH

## 2018-08-15 LAB — CULTURE, BLOOD (ROUTINE X 2)
Culture: NO GROWTH
Special Requests: ADEQUATE

## 2018-08-15 LAB — MAGNESIUM: Magnesium: 1.8 mg/dL (ref 1.7–2.4)

## 2018-08-15 LAB — PATHOLOGIST SMEAR REVIEW

## 2018-08-15 MED ORDER — SPIRONOLACTONE 25 MG PO TABS
50.0000 mg | ORAL_TABLET | Freq: Every day | ORAL | Status: DC
  Administered 2018-08-16 – 2018-08-20 (×5): 50 mg via ORAL
  Filled 2018-08-15 (×5): qty 2

## 2018-08-15 MED ORDER — SPIRONOLACTONE 25 MG PO TABS
25.0000 mg | ORAL_TABLET | Freq: Once | ORAL | Status: AC
  Administered 2018-08-15: 13:00:00 25 mg via ORAL
  Filled 2018-08-15: qty 1

## 2018-08-15 NOTE — Progress Notes (Signed)
NCM received call from [facility] evaluator, John Giovanni [phone]). Magda Paganini informed NCM  referral received for level 2 PASRR evaluation, process will probably take 1-2 days for approval. Whitman Hero RN,BSN,CM

## 2018-08-15 NOTE — Progress Notes (Signed)
Occupational Therapy Treatment Patient Details Name: Anne Black MRN: [id number] DOB: [birth year] Today's Date: 08/15/2018    History of present illness 58 y.o. female with medical history significant of alcoholic cirrhosis with varices and chronic ascites, COPD, bipolar disorder, MS, paroxysmal atrial fibrillation not anticoagulated, smoking, chronic respiratory failure on 2-3 L home oxygen presenting to the hospital via EMS. Found to be hypotensive, HR-110's Chest x-ray showing cardiomegaly with findings suggestive of worsening volume overload. Admitted 08/10/2018 for sepsis secondary to UTI with possible pnuemonia, and B LE cellulitis.   OT comments  Patient seated in recliner, calling out for assist to commode.  Pt requires min assist using RW for stand pivot transfer to 3:1 commode, total assist for toileting after + BM.  Poor carryover with safety techniques, poor problem solving, STM and awareness.  Assisted pt back to supine with min guard assist for safety. Will follow acutely, SNF remains appropriate.    Follow Up Recommendations  SNF    Equipment Recommendations  3 in 1 bedside commode    Recommendations for Other Services      Precautions / Restrictions Precautions Precautions: None Restrictions Weight Bearing Restrictions: No       Mobility Bed Mobility Overal bed mobility: Needs Assistance Bed Mobility: Sit to Supine     Supine to sit: Mod assist Sit to supine: Min guard   General bed mobility comments: min guard for safety and balance, no physical assist required to return supine   Transfers Overall transfer level: Needs assistance Equipment used: Rolling walker (2 wheeled) Transfers: Sit to/from [employer] Sit to Stand: Min assist Stand pivot transfers: Min assist       General transfer comment: min assist to power up, for safety and balance; cueing for hand placement and safety with poor carryover during transfers    Balance Overall  balance assessment: Needs assistance Sitting-balance support: Feet supported;No upper extremity supported Sitting balance-Leahy Scale: Fair Sitting balance - Comments: min guard to close supervision for safety statically  Postural control: Posterior lean Standing balance support: Bilateral upper extremity supported;During functional activity Standing balance-Leahy Scale: Poor Standing balance comment: requires B UE support to maintain balance                           ADL either performed or assessed with clinical judgement   ADL Overall ADL's : Needs assistance/impaired                         Toilet Transfer: Minimal assistance;Stand-pivot;BSC;RW   Toileting- Clothing Manipulation and Hygiene: Total assistance;Sit to/from stand Toileting - Clothing Manipulation Details (indicate cue type and reason): total assist for clothing mgmt and hygiene, reports "I can't reach" after + BM and reports her daughters assist at baseline      Functional mobility during ADLs: Minimal assistance;Rolling walker;Cueing for safety General ADL Comments: pt limited by cognition, balance, weakness and activity tolerance     Vision       Perception     Praxis      Cognition Arousal/Alertness: Lethargic Behavior During Therapy: Flat affect Overall Cognitive Status: History of cognitive impairments - at baseline                                 General Comments: patient presents with decreased safety awareness, STM, problem solving during session  Exercises     Shoulder Instructions       General Comments pt reporting nauseated at completion of session, RN notified     Pertinent Vitals/ Pain       Pain Assessment: Faces Faces Pain Scale: Hurts even more Pain Location: back Pain Descriptors / Indicators: Grimacing Pain Intervention(s): Limited activity within patient's tolerance;Repositioned  Home Living                                           Prior Functioning/Environment              Frequency  Min 2X/week        Progress Toward Goals  OT Goals(current goals can now be found in the care plan section)  Progress towards OT goals: Progressing toward goals  Acute Rehab OT Goals Patient Stated Goal: to go to the bathroom OT Goal Formulation: With patient  Plan Discharge plan remains appropriate;Frequency remains appropriate    Co-evaluation                 AM-PAC OT "6 Clicks" Daily Activity     Outcome Measure   Help from another person eating meals?: None Help from another person taking care of personal grooming?: A Little Help from another person toileting, which includes using toliet, bedpan, or urinal?: Total Help from another person bathing (including washing, rinsing, drying)?: A Lot Help from another person to put on and taking off regular upper body clothing?: A Little Help from another person to put on and taking off regular lower body clothing?: Total 6 Click Score: 14    End of Session Equipment Utilized During Treatment: Rolling walker;Oxygen  OT Visit Diagnosis: Unsteadiness on feet (R26.81);Muscle weakness (generalized) (M62.81);Adult, failure to thrive (R62.7)   Activity Tolerance Patient tolerated treatment well   Patient Left in bed;with call bell/phone within reach;with bed alarm set;with nursing/sitter in room   Nurse Communication Mobility status        Time: 5852-7782 OT Time Calculation (min): 20 min  Charges: OT General Charges $OT Visit: 1 Visit OT Treatments $Self Care/Home Management : 8-22 mins  Delight Stare, [place] Pager [phone] Office [place] 08/15/2018, 4:45 PM

## 2018-08-15 NOTE — Progress Notes (Signed)
Physical Therapy Treatment Patient Details Name: Anne Black MRN: [id number] DOB: Sep 13, 1960 Today's Date: 08/15/2018    History of Present Illness 58 y.o. female with medical history significant of alcoholic cirrhosis with varices and chronic ascites, COPD, bipolar disorder, MS, paroxysmal atrial fibrillation not anticoagulated, smoking, chronic respiratory failure on 2-3 L home oxygen presenting to the hospital via EMS. Found to be hypotensive, HR-110's Chest x-ray showing cardiomegaly with findings suggestive of worsening volume overload. Admitted 08/10/2018 for sepsis secondary to UTI with possible pnuemonia, and B LE cellulitis.    PT Comments    Pt requires maximal encouragement to get out of bed today. Pt states she is cold. With bed mobility, bed found to be wet with urine. Pt requested pt get up to chair so that bed linens could be changed. Pt requires modA for bed mobility, modAx2 for transfers and ambulation of 3 feet to recliner. Pt refuses any further ambulation with therapy today. D/c plans remain appropriate at this time. PT will continue to follow acutely.    Follow Up Recommendations  SNF     Equipment Recommendations  None recommended by PT    Recommendations for Other Services OT consult     Precautions / Restrictions Precautions Precautions: None Restrictions Weight Bearing Restrictions: No    Mobility  Bed Mobility Overal bed mobility: Needs Assistance Bed Mobility: Supine to Sit     Supine to sit: Mod assist     General bed mobility comments: modA for management of LE to EoB, and trunk to upright,   Transfers Overall transfer level: Needs assistance Equipment used: Rolling walker (2 wheeled) Transfers: Sit to/from Stand Sit to Stand: Min assist         General transfer comment: minA for power up and steadying with RW, decreased control with sitting in chair, refuses to assist with scooting hips back in chair  Ambulation/Gait Ambulation/Gait  assistance: Min assist Gait Distance (Feet): 3 Feet Assistive device: Rolling walker (2 wheeled) Gait Pattern/deviations: Decreased step length - right;Decreased step length - left;Shuffle;Trunk flexed;Step-to pattern Gait velocity: slowed Gait velocity interpretation: <1.31 ft/sec, indicative of household ambulator General Gait Details: minA for steadying with stepping to RW, slow, steps to recliner          Balance Overall balance assessment: Needs assistance Sitting-balance support: Feet supported;No upper extremity supported Sitting balance-Leahy Scale: Poor Sitting balance - Comments: requires 2 minutes to achieve independent sitting balance, still needs B UE support to maintian Postural control: Posterior lean Standing balance support: Bilateral upper extremity supported Standing balance-Leahy Scale: Poor Standing balance comment: requires B UE support to maintain balance                            Cognition Arousal/Alertness: Lethargic Behavior During Therapy: Agitated;Flat affect Overall Cognitive Status: History of cognitive impairments - at baseline                                           General Comments General comments (skin integrity, edema, etc.): Pt found incontinent of urine sitting in wet spot complaining of being cold on entry. Reports not knowing she was incontinent       Pertinent Vitals/Pain Pain Assessment: Faces Faces Pain Scale: Hurts even more Pain Location: back Pain Descriptors / Indicators: Grimacing Pain Intervention(s): Limited activity within patient's tolerance;Monitored during session;Repositioned  PT Goals (current goals can now be found in the care plan section) Acute Rehab PT Goals PT Goal Formulation: With patient Time For Goal Achievement: 08/25/18 Potential to Achieve Goals: Fair Progress towards PT goals: Progressing toward goals    Frequency    Min 2X/week      PT Plan Current  plan remains appropriate       AM-PAC PT "6 Clicks" Mobility   Outcome Measure  Help needed turning from your back to your side while in a flat bed without using bedrails?: A Little Help needed moving from lying on your back to sitting on the side of a flat bed without using bedrails?: Total Help needed moving to and from a bed to a chair (including a wheelchair)?: A Little Help needed standing up from a chair using your arms (e.g., wheelchair or bedside chair)?: A Little Help needed to walk in hospital room?: A Lot Help needed climbing 3-5 steps with a railing? : Total 6 Click Score: 13    End of Session Equipment Utilized During Treatment: Gait belt;Oxygen Activity Tolerance: Treatment limited secondary to agitation Patient left: in chair;with call bell/phone within reach;with nursing/sitter in room;with chair alarm set Nurse Communication: Mobility status;Other (comment)(repeated request for attorney ) PT Visit Diagnosis: Unsteadiness on feet (R26.81);Other abnormalities of gait and mobility (R26.89);Muscle weakness (generalized) (M62.81);History of falling (Z91.81);Difficulty in walking, not elsewhere classified (R26.2);Pain;Adult, failure to thrive (R62.7) Pain - Right/Left: (bilateral ) Pain - part of body: Leg(abdomen)     Time: 1335-1350 PT Time Calculation (min) (ACUTE ONLY): 15 min  Charges:  $Gait Training: 8-22 mins                     Ivett Luebbe B. Beverely Risen PT, DPT Acute Rehabilitation Services Pager [phone] Office [phone]    [place] 08/15/2018, 3:05 PM

## 2018-08-15 NOTE — Progress Notes (Signed)
[facility] Progress Note  Patient: Anne Black YPP:[id number]   PCP: Patient, No Pcp Per DOB: 03/15/1960   DOA: 08/10/2018   DOS: 08/15/2018   Date of Service: the patient was seen and examined on 08/15/2018  Brief hospital course: Pt. with PMH of alcoholic cirrhosis with varices and chronic ascites, COPD, bipolar disorder, MS, paroxysmal atrial fibrillation not anticoagulated, smoking, chronic respiratory failure on 2-3 L home oxygen; admitted on 08/10/2018, presented with complaint of shortness of breath, was found to have recurrent ascites. Currently further plan is follow-up on paracentesis.  Subjective: Patient denies any acute complaint.  No nausea no vomiting no fever no chills.  Assessment and Plan: Sepsis secondary to UTI, possible SBP. No pneumonia no cellulitis. Hypothermic, hypotensive, and tachycardic on arrival.  Lactic acid 2.4.  No leukocytosis.  UA suggestive of infection.  Chest x-ray with retrocardiac opacity concerning for possible infiltrate. Noted to have mild bilateral lower extremity erythema and warmth to touch on exam. -Received 1 L IV fluid bolus and blood pressure improved Started on IV cefepime and vancomycin. We will transition to oral Keflex to cover for urine as well as abdomen concerning for SBP.  So far cultures are negative. Blood cultures are growing 1 out of 4 likely contamination. Urine culture negative.  Acute on chronic hyponatremia On admission sodium 123, chronically low in the setting of liver cirrhosis.  Sodium was ranging between 124-130 last month.   -Received IV fluid for hypotension and sepsis.  Unable to diurese at this time. -Continue to monitor sodium level  AKI Creatinine 1.0, baseline 0.3-0.4. Prerenal from sepsis/hypotension versus cardiorenal/ hepatorenal. -Received IV fluid for hypotension and sepsis -Repeat BMP in a.m. -Avoid nephrotoxic agents -Monitor urine output  Decompensated alcoholic cirrhosis with ascites  Underwent diagnostic paracentesis in the ED and only 100 cc peritoneal fluid removed.  Gram stain showing no organisms. -Peritoneal fluid culture pending -Abdomen appears distended.   Underwent paracentesis x2.  3 L on 5 L. Given albumin post paracentesis. Continue Lasix and Aldactone.  Brisk diuresis noted.    Hepatic encephalopathy Ammonia level 66.  Appears slightly confused and not completely oriented. -Lactulose  Acutely decompensated diastolic congestive heart failure Currently on 2 L supplemental oxygen, uses 2 to 3 L at home.  Not tachypneic.  Chest x-ray showing cardiomegaly with findings suggestive of worsening volume overload.  Probable trace to small bilateral pleural effusions. Echo done January 2020 showing LVEF 65 to 70% and grade 1 diastolic dysfunction. -Unable to diurese at this time given sepsis/hypotension  Physical deconditioning -PT evaluation  Paroxysmal atrial fibrillation CHA2DS2VASc 2.  Per cardiology documentation, not a candidate for long-term anticoagulation given active alcohol abuse.  Alcohol use disorder No signs of withdrawal at this time. -CIWA protocol; Ativan PRN -Thiamine, folate, multivitamin  COPD -Stable.  No wheezing.  DuoNebs PRN.  History of multiple sclerosis Currently not on any medications.  Last neurology visit in March 2020. -Ensure outpatient follow-up  Tobacco use -NicoDerm patch -Counseled to quit  Diet: Cardiac diet DVT Prophylaxis: SCD, pharmacological prophylaxis contraindicated due to Anemia and cirrhosis  Advance goals of care discussion: Full code, palliative care consulted for goals of care discussion.  Family Communication: no family was present at bedside, at the time of interview.   Disposition:  Discharge to SNF .  Consultants: IR Procedures: none  Scheduled Meds: . cephALEXin  500 mg Oral Q12H  . folic acid  1 mg Oral Daily  . furosemide  40 mg Oral Daily  .  gabapentin  300 mg Oral TID  .  Gerhardt's butt cream   Topical BID  . lactulose  10 g Oral BID  . multivitamin with minerals  1 tablet Oral Daily  . nicotine  21 mg Transdermal Daily  . pantoprazole  40 mg Oral Daily  . [START ON 08/16/2018] spironolactone  50 mg Oral Daily  . thiamine  100 mg Oral Daily   Or  . thiamine  100 mg Intravenous Daily   Continuous Infusions: . sodium chloride 1,000 mL (08/13/18 0305)   PRN Meds: sodium chloride, acetaminophen **OR** acetaminophen, alum & mag hydroxide-simeth, ipratropium-albuterol, lactulose **AND** lactulose, lidocaine, LORazepam, ondansetron (ZOFRAN) IV Antibiotics: Anti-infectives (From admission, onward)   Start     Dose/Rate Route Frequency Ordered Stop   08/13/18 1000  cephALEXin (KEFLEX) capsule 500 mg     500 mg Oral Every 12 hours 08/13/18 0749 08/16/18 0959   08/12/18 0600  vancomycin (VANCOCIN) IVPB 750 mg/150 ml premix  Status:  Discontinued     750 mg 150 mL/hr over 60 Minutes Intravenous Every 24 hours 08/11/18 0107 08/11/18 0828   08/11/18 1800  ceFEPIme (MAXIPIME) 2 g in sodium chloride 0.9 % 100 mL IVPB  Status:  Discontinued     2 g 200 mL/hr over 30 Minutes Intravenous Every 8 hours 08/11/18 1315 08/13/18 0749   08/11/18 1000  ceFEPIme (MAXIPIME) 1 g in sodium chloride 0.9 % 100 mL IVPB  Status:  Discontinued     1 g 200 mL/hr over 30 Minutes Intravenous Every 8 hours 08/11/18 0107 08/11/18 1315   08/10/18 2115  ceFEPIme (MAXIPIME) 2 g in sodium chloride 0.9 % 100 mL IVPB     2 g 200 mL/hr over 30 Minutes Intravenous  Once 08/10/18 2107 08/11/18 0148   08/10/18 2115  vancomycin (VANCOCIN) 1,500 mg in sodium chloride 0.9 % 500 mL IVPB     1,500 mg 250 mL/hr over 120 Minutes Intravenous  Once 08/10/18 2107 08/11/18 0100       Objective: Physical Exam: Vitals:   08/14/18 1938 08/15/18 0304 08/15/18 0304 08/15/18 1239  BP: (!) 95/53  (!) 105/50 (!) 99/51  Pulse: 92  86 94  Resp: 18  18 20   Temp: 98 F (36.7 C)  97.9 F (36.6 C) 98 F (36.7 C)   TempSrc:   Oral   SpO2: 100%  100% 100%  Weight:  81.2 kg    Height:        Intake/Output Summary (Last 24 hours) at 08/15/2018 1928 Last data filed at 08/15/2018 1726 Gross per 24 hour  Intake 965.24 ml  Output 1950 ml  Net -984.76 ml   Filed Weights   08/13/18 0558 08/14/18 0537 08/15/18 0304  Weight: 86.7 kg 89.2 kg 81.2 kg   General: alert and oriented to time, place, and person. Appear in mild distress, affect tearful Eyes: PERRL, Conjunctiva normal ENT: Oral Mucosa Clear, moist  Neck: difficult to assess  JVD, ono Abnormal Mass Or lumps Cardiovascular: S1 and S2 Present, no Murmur, peripheral pulses symmetrical Respiratory: normal respiratory effort, Bilateral Air entry equal and Decreased, no use of accessory muscle, Clear to Auscultation, no Crackles, no wheezes Abdomen: Bowel Sound present, Soft and mild tenderness, no hernia Skin: no rashes  Extremities: no Pedal edema, no calf tenderness Neurologic: normal without focal findings, mental status, speech normal, alert and oriented x3, PERLA, Motor strength 5/5 and symmetric and sensation grossly normal to light touch Gait not checked due to patient safety concerns  Data Reviewed: CBC: Recent Labs  Lab 08/10/18 1803 08/11/18 0414 08/12/18 0420  WBC 5.4 5.1 4.2  NEUTROABS 3.7  --  2.9  HGB 8.5* 7.6* 8.0*  HCT 27.2* 24.5* 26.3*  MCV 80.5 79.3* 83.2  PLT 114* 103* 81*   Basic Metabolic Panel: Recent Labs  Lab 08/11/18 0414 08/12/18 0420 08/13/18 0822 08/14/18 1046 08/15/18 0446  NA 124* 128* 130* 132* 132*  K 4.2 4.5 4.1 3.7 3.2*  CL 89* 92* 93* 92* 89*  CO2 28 30 30  33* 35*  GLUCOSE 98 93 83 117* 126*  BUN 8 6 7 7 8   CREATININE 0.86 0.62 0.56 0.83 0.80  CALCIUM 7.5* 7.9* 8.0* 7.9* 8.5*  MG  --  2.0 1.9 1.7 1.8    Liver Function Tests: Recent Labs  Lab 08/10/18 1803 08/11/18 0414 08/12/18 0420  AST 49* 46* 46*  ALT 25 22 22   ALKPHOS 151* 146* 145*  BILITOT 3.0* 2.9* 4.4*  PROT 5.9* 5.4* 5.6*   ALBUMIN 2.1* 2.0* 2.0*   Recent Labs  Lab 08/10/18 1803  LIPASE 35   Recent Labs  Lab 08/10/18 1805  AMMONIA 66*   Coagulation Profile: Recent Labs  Lab 08/10/18 1803  INR 1.7*   Cardiac Enzymes: No results for input(s): CKTOTAL, CKMB, CKMBINDEX, TROPONINI in the last 168 hours. BNP (last 3 results) No results for input(s): PROBNP in the last 8760 hours. CBG: No results for input(s): GLUCAP in the last 168 hours. Studies: No results found.   Time spent: 35 minutes  Author: Lynden OxfordPranav Lipa Knauff, MD [facility] 08/15/2018 7:28 PM  To reach On-call, see care teams to locate the attending and reach out to them via [email or web address]. If 7PM-7AM, please contact night-coverage If you still have difficulty reaching the attending provider, please page the [facility] (Director on Call) for [facility] on amion for assistance.

## 2018-08-16 DIAGNOSIS — D649 Anemia, unspecified: Secondary | ICD-10-CM

## 2018-08-16 DIAGNOSIS — K7031 Alcoholic cirrhosis of liver with ascites: Secondary | ICD-10-CM

## 2018-08-16 LAB — MAGNESIUM: Magnesium: 1.7 mg/dL (ref 1.7–2.4)

## 2018-08-16 LAB — BASIC METABOLIC PANEL
Anion gap: 6 (ref 5–15)
BUN: 6 mg/dL (ref 6–20)
CO2: 38 mmol/L — ABNORMAL HIGH (ref 22–32)
Calcium: 8.4 mg/dL — ABNORMAL LOW (ref 8.9–10.3)
Chloride: 92 mmol/L — ABNORMAL LOW (ref 98–111)
Creatinine, Ser: 0.75 mg/dL (ref 0.44–1.00)
GFR calc Af Amer: >60 mL/min (ref >60)
GFR calc non Af Amer: >60 mL/min (ref >60)
Glucose, Bld: 90 mg/dL (ref 70–99)
Potassium: 3.4 mmol/L — ABNORMAL LOW (ref 3.5–5.1)
Sodium: 136 mmol/L (ref 135–145)

## 2018-08-16 LAB — CBC
HCT: 23.2 % — ABNORMAL LOW (ref 36.0–46.0)
Hemoglobin: 7.1 g/dL — ABNORMAL LOW (ref 12.0–15.0)
MCH: 25.7 pg — ABNORMAL LOW (ref 26.0–34.0)
MCHC: 30.6 g/dL (ref 30.0–36.0)
MCV: 84.1 fL (ref 80.0–100.0)
Platelets: 67 10*3/uL — ABNORMAL LOW (ref 150–400)
RBC: 2.76 MIL/uL — ABNORMAL LOW (ref 3.87–5.11)
RDW: 22.4 % — ABNORMAL HIGH (ref 11.5–15.5)
WBC: 2.9 10*3/uL — ABNORMAL LOW (ref 4.0–10.5)
nRBC: 0 % (ref 0.0–0.2)

## 2018-08-16 NOTE — [place] FL2 (Signed)
[place] MEDICAID FL2 LEVEL OF CARE SCREENING TOOL     IDENTIFICATION  Patient Name: Anne Black Birthdate: 11-06-1960 Sex: female Admission Date (Current Location): 08/10/2018  [facility] [place]   Facility and Address:  [facility], [address], [place], [place] [address]      Provider Number: [id number]  Attending Physician Name and Address:  Standley BrookingGoodrich, Daniel P, MD  Relative Name and Phone Number:  Roanna EpleyGarland (daughter)[phone]    Current Level of Care: Hospital Recommended Level of Care: Skilled Nursing Facility Prior Approval Number:    Date Approved/Denied:   PASRR Number: [id number]  Discharge Plan: SNF    Current Diagnoses: Patient Active Problem List   Diagnosis Date Noted  . Palliative care by specialist   . Sepsis (HCC) 08/11/2018  . AKI (acute kidney injury) (HCC) 08/11/2018  . UTI (urinary tract infection) 08/10/2018  . Cellulitis of lower extremity   . Peripheral edema   . Atrial fibrillation with RVR (HCC) 06/13/2018  . Alcohol dependence (HCC) 05/06/2018  . Electrolyte disturbance 05/05/2018  . Alcohol withdrawal delirium (HCC) 04/28/2018  . Weakness generalized 04/27/2018  . Gait abnormality 04/24/2018  . Chronic hypoxemic respiratory failure (HCC) 04/11/2018  . Atrial fibrillation with rapid ventricular response (HCC) 03/23/2018  . Head injury   . Fall 03/19/2018  . Syncope 03/19/2018  . Orbital fracture 03/19/2018  . Scalp laceration 03/19/2018  . Acute respiratory failure with hypoxia and hypercarbia (HCC) 03/19/2018  . Abdominal tenderness 03/19/2018  . Acute on chronic respiratory failure with hypoxia (HCC) 03/17/2018  . Alcohol abuse with intoxication (HCC) 03/17/2018  . ETOH abuse   . Normocytic anemia 03/16/2018  . Acute metabolic encephalopathy 03/16/2018  . Lower extremity edema 03/16/2018  . Migraine 03/16/2018  . Community acquired pneumonia of left lower lobe of lung (HCC)    . Weakness 08/21/2017  . COPD exacerbation (HCC) 06/12/2017  . Chest pain with moderate risk for cardiac etiology 05/30/2017  . Abnormal LFTs 05/30/2017  . Tachycardia 05/30/2017  . COPD (chronic obstructive pulmonary disease) (HCC) 04/19/2017  . Right rib fracture 04/15/2017  . Abdominal wall cellulitis 12/30/2016  . S/P exploratory laparotomy 12/30/2016  . Distal radius fracture, left 09/09/2016  . Thrombocytopenia (HCC) 09/09/2016  . Chest x-ray abnormality   . Umbilical hernia without obstruction and without gangrene 05/12/2016  . Itching 02/24/2016  . Ventral hernia without obstruction or gangrene 02/24/2016  . Goals of care, counseling/discussion   . Ascites   . Tachypnea   . Atrial flutter with rapid ventricular response (HCC)   . Hypomagnesemia   . Hypoxia   . Dehydration 01/22/2016  . Low back ache 12/30/2015  . Non-intractable vomiting with nausea   . Alcohol abuse with alcohol-induced mood disorder (HCC) 12/15/2015  . Acute alcoholism (HCC)   . Distended abdomen 11/04/2015  . Alcohol abuse   . Dyspnea   . Acute respiratory failure (HCC) 10/15/2015  . Ascites due to alcoholic cirrhosis (HCC) 10/15/2015  . Osteoporosis   . Multiple sclerosis (HCC)   . Alcoholic cirrhosis of liver with ascites (HCC) 08/22/2015  . Bipolar disorder, current episode mixed, moderate (HCC) 08/01/2015  . Alcohol use disorder, severe, dependence (HCC) 07/31/2015  . Macrocytic anemia- due to alcohol abuse with normal B12 & folate levels 05/31/2015  . Severe protein-calorie malnutrition (HCC) 05/31/2015  . Hypokalemia 05/26/2015  . Acute hepatic encephalopathy 05/26/2015  . Alcohol withdrawal (HCC) 05/18/2015  . Abdominal pain 05/18/2015  . Anxiety 05/18/2015  .  Stimulant abuse ([place]) 12/27/2014  . Nicotine abuse 12/27/2014  . Hyperprolactinemia ([place]) 11/15/2014  . Dyslipidemia   . Alcohol use disorder, moderate, dependence ([place]) 10/30/2014  . Tobacco abuse 01/23/2014  . Bipolar 1  disorder ([place]) 04/07/2013  . Noncompliance with therapeutic plan 04/04/2013  . Hereditary and idiopathic peripheral neuropathy 05/01/2012  . Depression 05/01/2012  . GERD (gastroesophageal reflux disease) 05/01/2012  . Osteoarthrosis, unspecified whether generalized or localized, involving lower leg 05/01/2012  . Hyponatremia 07/01/2011    Orientation RESPIRATION BLADDER Height & Weight     Self, Time, Situation, Place  O2(nasal cannula 2L/min) Incontinent, External catheter Weight: 82.4 kg Height:  5\' 2"  (157.5 cm)  BEHAVIORAL SYMPTOMS/MOOD NEUROLOGICAL BOWEL NUTRITION STATUS      Continent Diet(see discharge summary)  AMBULATORY STATUS COMMUNICATION OF NEEDS Skin   Extensive Assist Verbally Skin abrasions(abrasion on buttocks, excoriated arms, MASD buttocks and groin)                       Personal Care Assistance Level of Assistance  Bathing, Feeding, Dressing, Total care Bathing Assistance: Limited assistance Feeding assistance: Limited assistance Dressing Assistance: Limited assistance Total Care Assistance: Maximum assistance   Functional Limitations Info  Sight, Hearing, Speech Sight Info: Adequate Hearing Info: Adequate Speech Info: Adequate    SPECIAL CARE FACTORS FREQUENCY  PT (By licensed PT), OT (By licensed OT)     PT Frequency: min 5x weekly OT Frequency: min 5x weekly            Contractures Contractures Info: Not present    Additional Factors Info  Code Status, Allergies Code Status Info: Full Allergies Info: No Known Allergies           Current Medications (08/16/2018):  This is the current hospital active medication list Current Facility-Administered Medications  Medication Dose Route Frequency Provider Last Rate Last Dose  . 0.9 %  sodium chloride infusion   Intravenous PRN Lavina Hamman, MD 10 mL/hr at 08/13/18 0305 1,000 mL at 08/13/18 0305  . acetaminophen (TYLENOL) tablet 650 mg  650 mg Oral Q6H PRN Shela Leff, MD   650 mg  at 08/16/18 0059   Or  . acetaminophen (TYLENOL) suppository 650 mg  650 mg Rectal Q6H PRN Shela Leff, MD      . alum & mag hydroxide-simeth (MAALOX/MYLANTA) 200-200-20 MG/5ML suspension 15 mL  15 mL Oral Q6H PRN Lavina Hamman, MD   15 mL at 08/16/18 1831  . folic acid (FOLVITE) tablet 1 mg  1 mg Oral Daily Shela Leff, MD   1 mg at 08/16/18 1009  . furosemide (LASIX) tablet 40 mg  40 mg Oral Daily Lavina Hamman, MD   40 mg at 08/16/18 1002  . gabapentin (NEURONTIN) capsule 300 mg  300 mg Oral TID Lavina Hamman, MD   300 mg at 08/16/18 1741  . Gerhardt's butt cream   Topical BID Lavina Hamman, MD      . ipratropium-albuterol (DUONEB) 0.5-2.5 (3) MG/3ML nebulizer solution 3 mL  3 mL Nebulization Q6H PRN Shela Leff, MD      . lactulose (CHRONULAC) 10 GM/15ML solution 10 g  10 g Oral BID Shela Leff, MD   10 g at 08/16/18 1008   And  . lactulose (CHRONULAC) 10 GM/15ML solution 10 g  10 g Oral Daily PRN Shela Leff, MD   10 g at 08/16/18 1002  . lidocaine (XYLOCAINE) 1 % (with pres) injection   Infiltration PRN Louk,  Waylan Boga, PA-C   10 mL at 08/14/18 1203  . LORazepam (ATIVAN) tablet 0.5 mg  0.5 mg Oral Q6H PRN Rolly Salter, MD   0.5 mg at 08/16/18 1742  . multivitamin with minerals tablet 1 tablet  1 tablet Oral Daily John Giovanni, MD   1 tablet at 08/16/18 1009  . nicotine (NICODERM CQ - dosed in mg/24 hours) patch 21 mg  21 mg Transdermal Daily John Giovanni, MD   21 mg at 08/16/18 1009  . ondansetron (ZOFRAN) injection 4 mg  4 mg Intravenous Q6H PRN Rolly Salter, MD   4 mg at 08/16/18 1831  . pantoprazole (PROTONIX) EC tablet 40 mg  40 mg Oral Daily Rolly Salter, MD   40 mg at 08/16/18 1002  . spironolactone (ALDACTONE) tablet 50 mg  50 mg Oral Daily Rolly Salter, MD   50 mg at 08/16/18 1002  . thiamine (VITAMIN B-1) tablet 100 mg  100 mg Oral Daily John Giovanni, MD   100 mg at 08/16/18 1002   Or  . thiamine (B-1)  injection 100 mg  100 mg Intravenous Daily John Giovanni, MD         Discharge Medications: Please see discharge summary for a list of discharge medications.  Relevant Imaging Results:  Relevant Lab Results:   Additional Information SSN: [id number]  Epifanio Lesches, RN

## 2018-08-16 NOTE — Progress Notes (Signed)
PROGRESS NOTE  Anne Black YTK:[id number] DOB: 10-06-1960 DOA: 08/10/2018 PCP: Patient, No Pcp Per  Brief History   58 year old woman PMH alcoholic cirrhosis, varices, chronic ascites, COPD, bipolar disorder, multiple sclerosis, chronic hypoxic respiratory failure on 2-3 L nasal cannula, presenting with recurrent ascites.  Admitted for sepsis secondary to UTI, possible SBP.  A & P  Sepsis secondary to UTI, possible SBP --Appears resolved.  Afebrile, normotensive.  Off antibiotics. --Some concern given to retrocardiac opacity initially, treated with initially with antibiotics to cover this as well as possible SBP --Culture data unrevealing  Decompensated alcoholic cirrhosis, with associated ascites, hyponatremia with reported ongoing alcohol use --Appears stable at this point.  Follow clinically. --Continue lactulose, spironolactone  Acute kidney injury, resolved  Acute on chronic hyponatremia, improved, stable --Secondary to known cirrhosis  Anemia of chronic disease, baseline somewhere around 8-9. --Somewhat lower today although not much different from 48 hours ago.  No evidence of acute bleeding.  Follow clinically.  Repeat CBC in a.m.  Paroxysmal atrial fibrillation, not a candidate for long-term anticoagulation given active alcohol use --Stable.  Alcohol use disorder, no evidence of withdrawal --Continue thiamine, folate, multivitamin. CIWA expired  PMH multiple sclerosis, not currently on any treatment   Stable for transfer to skilled nursing facility   DVT prophylaxis: SCDs Code Status: Full Family Communication: none Disposition Plan: SNF    Murray Hodgkins, MD  [facility] Direct contact: see [email or web address] (further directions at bottom of note if needed) 7PM-7AM contact night coverage as at bottom of note 08/16/2018, 7:38 PM  LOS: 6 days   Consultants  .   Procedures  .   Antibiotics  .   Interval History/Subjective  Reports nausea.  No vomiting  today.  Chronic back pain secondary to MS.  Objective   Vitals:  Vitals:   08/15/18 1933 08/16/18 0349  BP: 104/66 (!) 107/53  Pulse: 98 89  Resp:    Temp: 98.7 F (37.1 C) 97.7 F (36.5 C)  SpO2: 97% 90%    Exam:  Constitutional:  . Appears calm and comfortable Respiratory:  . CTA bilaterally, no w/r/r.  . Respiratory effort normal.  Cardiovascular:  . RRR, no m/r/g . 1+ bilateral LE extremity edema   Abdomen:  . Soft Psychiatric:  . Mental status o Mood, affect appropriate  I have personally reviewed the following:   Today's Data  . Potassium 3.4, remainder BMP unremarkable.  Magnesium 1.7. . Hemoglobin lower, 7.1 . Platelets 67  Lab Data  .   Micro Data  .   Imaging  .   Cardiology Data  .   Other Data  .   Scheduled Meds: . folic acid  1 mg Oral Daily  . furosemide  40 mg Oral Daily  . gabapentin  300 mg Oral TID  . Gerhardt's butt cream   Topical BID  . lactulose  10 g Oral BID  . multivitamin with minerals  1 tablet Oral Daily  . nicotine  21 mg Transdermal Daily  . pantoprazole  40 mg Oral Daily  . spironolactone  50 mg Oral Daily  . thiamine  100 mg Oral Daily   Or  . thiamine  100 mg Intravenous Daily   Continuous Infusions: . sodium chloride 1,000 mL (08/13/18 0305)    Principal Problem:   Alcoholic cirrhosis of liver with ascites (HCC) Active Problems:   Goals of care, counseling/discussion   Palliative care by specialist   LOS: 6 days   How to contact the  TR[facility] Attending or Consulting provider 7A - 7P or covering provider during after hours 7P -7A, for this patient?  1. Check the care team in [facility] and look for a) attending/consulting TR[facility] provider listed and b) the [facility] team listed 2. Log into [email or web address] and use [place]'s universal password to access. If you do not have the password, please contact the hospital operator. 3. Locate the [facility] provider you are looking for under [facility] and page to a number that you  can be directly reached. 4. If you still have difficulty reaching the provider, please page the [facility] (Director on Call) for the Hospitalists listed on amion for assistance.

## 2018-08-16 NOTE — Progress Notes (Signed)
Patient is alert and orient with frequent request for nausea and anxiety meds, Most resent request is for SW to assist with a court appointed custody hearing scheduled next week.

## 2018-08-16 NOTE — Plan of Care (Signed)
  Problem: Clinical Measurements: Goal: Will remain free from infection Outcome: Progressing Goal: Diagnostic test results will improve Outcome: Progressing   Problem: Activity: Goal: Risk for activity intolerance will decrease Outcome: Progressing   Problem: Coping: Goal: Level of anxiety will decrease Outcome: Progressing   Problem: Safety: Goal: Ability to remain free from injury will improve Outcome: Progressing

## 2018-08-17 LAB — CBC
HCT: 23.4 % — ABNORMAL LOW (ref 36.0–46.0)
Hemoglobin: 6.9 g/dL — CL (ref 12.0–15.0)
MCH: 25.1 pg — ABNORMAL LOW (ref 26.0–34.0)
MCHC: 29.5 g/dL — ABNORMAL LOW (ref 30.0–36.0)
MCV: 85.1 fL (ref 80.0–100.0)
Platelets: 66 10*3/uL — ABNORMAL LOW (ref 150–400)
RBC: 2.75 MIL/uL — ABNORMAL LOW (ref 3.87–5.11)
RDW: 22.5 % — ABNORMAL HIGH (ref 11.5–15.5)
WBC: 3.1 10*3/uL — ABNORMAL LOW (ref 4.0–10.5)
nRBC: 0 % (ref 0.0–0.2)

## 2018-08-17 LAB — CULTURE, BLOOD (ROUTINE X 2)
Culture: NO GROWTH
Culture: NO GROWTH

## 2018-08-17 LAB — CULTURE, BODY FLUID W GRAM STAIN -BOTTLE: Culture: NO GROWTH

## 2018-08-17 LAB — PREPARE RBC (CROSSMATCH)

## 2018-08-17 MED ORDER — SODIUM CHLORIDE 0.9% IV SOLUTION
Freq: Once | INTRAVENOUS | Status: AC
  Administered 2018-08-17: 11:00:00 via INTRAVENOUS

## 2018-08-17 NOTE — Progress Notes (Signed)
Pt. With critical Hgb of 6.9 this am. On call for TRH paged to make aware.  

## 2018-08-17 NOTE — Care Management Important Message (Signed)
Important Message  Patient Details  Name: Anne Black MRN: [id number] Date of Birth: 05-26-1960   Medicare Important Message Given:  Yes     Shelda Altes 08/17/2018, 12:40 PM

## 2018-08-17 NOTE — Progress Notes (Signed)
Patient is receiving blood transfusion with not reactions.

## 2018-08-17 NOTE — Progress Notes (Signed)
Spoke with patients mother Cline Crock today and

## 2018-08-17 NOTE — Progress Notes (Signed)
Blood transfusion has started with patient for reassessment.

## 2018-08-17 NOTE — Progress Notes (Signed)
PROGRESS NOTE  KEIMONI ONOFREY DEY:[id number] DOB: 1960/04/01 DOA: 08/10/2018 PCP: Patient, No Pcp Per  Brief History   58 year old woman PMH alcoholic cirrhosis, varices, chronic ascites, COPD, bipolar disorder, multiple sclerosis, chronic hypoxic respiratory failure on 2-3 L nasal cannula, presented from home, found to be mildly hypothermic and hypotensive.  Admitted for suspected sepsis secondary to possible UTI or pneumonia or SBP.  House was reportedly in disarray and filthy.  A & P  Sepsis unclear etiology, resolved.  UTI was considered but culture was unrevealing, chest x-ray was equivocal and SBP was considered but culture data was negative. --Resolved, remains stable off antibiotics.  Decompensated alcoholic cirrhosis, with associated ascites, hyponatremia, hepatic encephalopathy with reported ongoing alcohol use --Appears stable.  Follow clinically. --Continue lactulose, spironolactone  COPD on 2-3 L home oxygen --Appears stable.  Continue supplemental oxygen.  Acute kidney injury, resolved  Acute on chronic hyponatremia, improved, stable --Secondary to known cirrhosis  Anemia of chronic disease, baseline somewhere around 8-9. --Slightly lower today, meets criteria for transfusion.  Receiving 1 unit PRBC, recheck hemoglobin in a.m.  No evidence of bleeding.  Suspect secondary to acute illness superimposed on chronic disease.  Paroxysmal atrial fibrillation, not a candidate for long-term anticoagulation given active alcohol use --Appears stable.  Alcohol use disorder, no evidence of withdrawal --Appears stable.  Continue thiamine, folate, multivitamin. CIWA expired  PMH multiple sclerosis, not currently on any treatment   Overall much improved and appears stable for transfer to skilled nursing facility when bed is available.  Will reexamine in the morning.  If ascites has significantly worsened would consider paracentesis prior to discharge.   DVT prophylaxis: SCDs Code  Status: Full Family Communication: none Disposition Plan: SNF    Brendia Sacks, MD  [facility] Direct contact: see [email or web address] (further directions at bottom of note if needed) 7PM-7AM contact night coverage as at bottom of note 08/17/2018, 1:18 PM  LOS: 7 days   Consultants  . Palliative medicine  Procedures  . 7/2 diagnostic paracentesis in the emergency department . 7/4 ultrasound-guided paracentesis 2.7 L . 7/6 ultrasound-guided paracentesis 5.7 L  Antibiotics  .   Interval History/Subjective  Overall feeling okay today.  Objective   Vitals:  Vitals:   08/16/18 2310 08/17/18 1103  BP: (!) 108/57 (!) 113/49  Pulse: 97 85  Resp: 19 16  Temp: 98.2 F (36.8 C) 98.1 F (36.7 C)  SpO2: 98% 100%    Exam:  Constitutional:   . Appears calm and comfortable ENMT:  . grossly normal hearing  Respiratory:  . CTA bilaterally, no w/r/r.  . Respiratory effort normal. Cardiovascular:  . RRR, no m/r/g . 1+ bilateral LE extremity edema   Abdomen:  . Significantly distended.  Nontender. Psychiatric:  . Mental status o Mood, affect appropriate  I have personally reviewed the following:   Today's Data  . Hemoglobin 6.9, platelets stable at 66, WBC 3.1  Lab Data  . From this hospitalization, reviewed  Micro Data  . Urine culture insignificant growth . Blood culture 1/2 with contaminant . Peritoneal fluid culture no growth  Imaging  . Chest x-ray on admission suggested cardiomegaly with worsening volume overload, retrocardiac opacity atelectasis versus infiltrate.  Cardiology Data  . EKG sinus rhythm, no acute changes  Other Data  .   Scheduled Meds: . folic acid  1 mg Oral Daily  . furosemide  40 mg Oral Daily  . gabapentin  300 mg Oral TID  . Gerhardt's butt cream   Topical BID  .  lactulose  10 g Oral BID  . multivitamin with minerals  1 tablet Oral Daily  . nicotine  21 mg Transdermal Daily  . pantoprazole  40 mg Oral Daily  . spironolactone   50 mg Oral Daily  . thiamine  100 mg Oral Daily   Or  . thiamine  100 mg Intravenous Daily   Continuous Infusions: . sodium chloride 1,000 mL (08/13/18 0305)    Principal Problem:   Alcoholic cirrhosis of liver with ascites (HCC) Active Problems:   Goals of care, counseling/discussion   Palliative care by specialist   Anemia   LOS: 7 days   How to contact the [facility] Attending or Consulting provider [place] or covering provider during after hours [place], for this patient?  1. Check the care team in [facility] and look for a) attending/consulting TR[facility] provider listed and b) the [facility] team listed 2. Log into [email or web address] and use [place]'s universal password to access. If you do not have the password, please contact the hospital operator. 3. Locate the [facility] provider you are looking for under [facility] and page to a number that you can be directly reached. 4. If you still have difficulty reaching the provider, please page the [facility] (Director on Call) for the Hospitalists listed on amion for assistance.

## 2018-08-17 NOTE — Progress Notes (Signed)
PT Cancellation Note  Patient Details Name: Anne Black MRN: [id number] DOB: [birth year]   Cancelled Treatment:    Reason Eval/Treat Not Completed: Medical issues which prohibited therapy(pt with Hgb of 6.9 and awaiting blood transfusion at this time)   [place] 08/17/2018, 11:12 AM  Elwyn Reach, PT Acute Rehabilitation Services Pager: [phone] Office: [phone]

## 2018-08-18 ENCOUNTER — Inpatient Hospital Stay (HOSPITAL_COMMUNITY): Payer: Medicare Other

## 2018-08-18 ENCOUNTER — Encounter (HOSPITAL_COMMUNITY): Payer: Self-pay | Admitting: Student

## 2018-08-18 HISTORY — PX: IR PARACENTESIS: IMG2679

## 2018-08-18 LAB — CBC
HCT: 27.3 % — ABNORMAL LOW (ref 36.0–46.0)
Hemoglobin: 8.4 g/dL — ABNORMAL LOW (ref 12.0–15.0)
MCH: 26.5 pg (ref 26.0–34.0)
MCHC: 30.8 g/dL (ref 30.0–36.0)
MCV: 86.1 fL (ref 80.0–100.0)
Platelets: 78 10*3/uL — ABNORMAL LOW (ref 150–400)
RBC: 3.17 MIL/uL — ABNORMAL LOW (ref 3.87–5.11)
RDW: 21.2 % — ABNORMAL HIGH (ref 11.5–15.5)
WBC: 3.5 10*3/uL — ABNORMAL LOW (ref 4.0–10.5)
nRBC: 0 % (ref 0.0–0.2)

## 2018-08-18 LAB — BPAM RBC
Blood Product Expiration Date: 202007152359
ISSUE DATE / TIME: 202007091115
Unit Type and Rh: 600

## 2018-08-18 LAB — TYPE AND SCREEN
ABO/RH(D): A POS
Antibody Screen: NEGATIVE
Unit division: 0

## 2018-08-18 MED ORDER — ALBUMIN HUMAN 25 % IV SOLN
25.0000 g | Freq: Once | INTRAVENOUS | Status: AC
  Administered 2018-08-18: 25 g via INTRAVENOUS
  Filled 2018-08-18: qty 100

## 2018-08-18 MED ORDER — BISACODYL 10 MG RE SUPP
10.0000 mg | Freq: Once | RECTAL | Status: AC
  Administered 2018-08-18: 10 mg via RECTAL
  Filled 2018-08-18: qty 1

## 2018-08-18 MED ORDER — LIDOCAINE HCL 1 % IJ SOLN
INTRAMUSCULAR | Status: AC
  Filled 2018-08-18: qty 20

## 2018-08-18 NOTE — Progress Notes (Signed)
PROGRESS NOTE  Anne Black WUJ:[id number] DOB: 08/07/1960 DOA: 08/10/2018 PCP: Patient, No Pcp Per  Brief History   58 year old woman PMH alcoholic cirrhosis, varices, chronic ascites, COPD, bipolar disorder, multiple sclerosis, chronic hypoxic respiratory failure on 2-3 L nasal cannula, presented from home, found to be mildly hypothermic and hypotensive.  Admitted for suspected sepsis secondary to possible UTI or pneumonia or SBP.  House was reportedly in [place].  A & P  Sepsis unclear etiology, resolved.  UTI was considered but culture was unrevealing, chest x-ray was equivocal and SBP was considered but culture data was negative. --Resolved, remains stable off antibiotics.  Decompensated alcoholic cirrhosis, with associated ascites, hyponatremia, hepatic encephalopathy with reported ongoing alcohol use --Stable other than increasing ascites. --Large-volume paracentesis today, therapeutic. --Continue lactulose, spironolactone  COPD on 2-3 L home oxygen --Remained stable.  Continue supplemental oxygen.  Acute kidney injury, resolved  Acute on chronic hyponatremia, improved, stable --Secondary to known cirrhosis  Anemia of chronic disease, baseline somewhere around 8-9. --Appropriately increase status post transfusion 1 unit PRBC yesterday.  Monitor clinically.  Paroxysmal atrial fibrillation, not a candidate for long-term anticoagulation given active alcohol use --Stable.  Alcohol use disorder, no evidence of withdrawal --Stable.  Continue thiamine, folate, multivitamin. CIWA expired  PMH multiple sclerosis, not currently on any treatment   Overall improved.  Plan therapeutic paracentesis today, remains stable for discharge.   DVT prophylaxis: SCDs Code Status: Full Family Communication: none Disposition Plan: SNF    Murray Hodgkins, MD  [facility] Direct contact: see [email or web address] (further directions at bottom of note if needed) 7PM-7AM contact  night coverage as at bottom of note 08/18/2018, 10:39 AM  LOS: 8 days   Consultants  . Palliative medicine  Procedures  . 7/2 diagnostic paracentesis in the emergency department . 7/4 ultrasound-guided paracentesis 2.7 L . 7/6 ultrasound-guided paracentesis 5.7 L . 7/10 transfusion 1 unit PRBC  Antibiotics  .   Interval History/Subjective  Feels okay today.  Tolerating diet.  No nausea or vomiting.  Mild abdominal discomfort with palpation.  Was constipated last night but did end up having a bowel movement.  Breathing okay.  Objective   Vitals:  Vitals:   08/18/18 0402 08/18/18 0849  BP: (!) 104/54 108/64  Pulse: 92   Resp: 20   Temp: 97.9 F (36.6 C)   SpO2: 98%     Exam:  Constitutional:   . Appears calm and comfortable Respiratory:  . CTA bilaterally, no w/r/r.  . Respiratory effort normal.  Cardiovascular:  . RRR, no m/r/g . 1+ bilateral LE extremity edema   Abdomen:  . Distended, mild generalized tenderness, fluid wave noted Psychiatric:  . Mental status o Mood, affect appropriate  I have personally reviewed the following:   Today's Data  . Hemoglobin 6.9, platelets stable at 66, WBC 3.1 . Urine output 2150.  -7.5 L since admission. . Hemoglobin appropriately increased status post transfusion 8.4.  Platelets stable 78.  Lab Data  . From this hospitalization, reviewed  Micro Data  . Urine culture insignificant growth . Blood culture 1/2 with contaminant . Peritoneal fluid culture no growth  Imaging  . Chest x-ray on admission suggested cardiomegaly with worsening volume overload, retrocardiac opacity atelectasis versus infiltrate.  Cardiology Data  . EKG sinus rhythm, no acute changes  Other Data  .   Scheduled Meds: . folic acid  1 mg Oral Daily  . furosemide  40 mg Oral Daily  . gabapentin  300 mg Oral TID  .  Gerhardt's butt cream   Topical BID  . lactulose  10 g Oral BID  . multivitamin with minerals  1 tablet Oral Daily  . nicotine   21 mg Transdermal Daily  . pantoprazole  40 mg Oral Daily  . spironolactone  50 mg Oral Daily  . thiamine  100 mg Oral Daily   Or  . thiamine  100 mg Intravenous Daily   Continuous Infusions: . sodium chloride 1,000 mL (08/13/18 0305)  . albumin human      Principal Problem:   Alcoholic cirrhosis of liver with ascites (HCC) Active Problems:   Goals of care, counseling/discussion   Palliative care by specialist   Anemia   LOS: 8 days   How to contact the [facility] Attending or Consulting provider 7A - 7P or covering provider during after hours 7P -7A, for this patient?  1. Check the care team in [facility] and look for a) attending/consulting [facility] provider listed and b) the [facility] team listed 2. Log into [email or web address] and use [place]'s universal password to access. If you do not have the password, please contact the hospital operator. 3. Locate the [facility] provider you are looking for under [facility] and page to a number that you can be directly reached. 4. If you still have difficulty reaching the provider, please page the [facility] (Director on Call) for the Hospitalists listed on amion for assistance.

## 2018-08-18 NOTE — Progress Notes (Signed)
Mom called again-  Gave update on SNF  Patient will be going to.  Spoke to Education officer, museum- will call mother today.

## 2018-08-18 NOTE — Progress Notes (Signed)
Mom called - gave update regarding plan.  Including plan for paracentesis today.

## 2018-08-18 NOTE — TOC Initial Note (Addendum)
Transition of Care [facility]) - Initial/Assessment Note    Patient Details  Name: Anne Black MRN: [id number] Date of Birth: 1960-02-19  Transition [facility]) CM/SW Contact:    Sharin Mons, RN Phone Number: 08/18/2018, 3:13 PM  Clinical Narrative:                  RNCM received consult for possible SNF placement at time of discharge. RNCM spoke with patient regarding PT recommendation of SNF placement at time of discharge. Patient agreeable to SNF placement and states she  is currently unable to care for herself @ home given her current physical needs and fall risk. Patient expressed SNF placement at time of discharge. Patient reports preference for [facility]. RNCM discussed insurance authorization process and provided Medicare SNF ratings list. Patient expressed being hopeful for rehab and to feel better soon. No further questions reported at this time. RNCM to continue to follow and assist with discharge planning needs.   Expected Discharge Plan: [place]     Patient Goals and CMS Choice Patient states their goals for this hospitalization and ongoing recovery are:: to get more independent CMS Medicare.gov Compare Post Acute Care list provided to:: Patient Choice offered to / list presented to : Patient  Expected Discharge Plan and Services Expected Discharge Plan: [place]   Discharge Planning Services: CM Consult   Living arrangements for the past 2 months: Single Family Home                                      Prior Living Arrangements/Services Living arrangements for the past 2 months: Single Family Home Lives with:: Self Patient language and need for interpreter reviewed:: Yes Do you feel safe going back to the place where you live?: Yes      Need for Family Participation in Patient Care: Yes (Comment) Care giver support system in place?: No (comment) Current home services: (owns walker, cane ,wheelchair)    Activities of  Daily Living Home Assistive Devices/Equipment: Walker (specify type) ADL Screening (condition at time of admission) Patient's cognitive ability adequate to safely complete daily activities?: No Is the patient deaf or have difficulty hearing?: No Does the patient have difficulty seeing, even when wearing glasses/contacts?: No Does the patient have difficulty concentrating, remembering, or making decisions?: No Patient able to express need for assistance with ADLs?: Yes Does the patient have difficulty dressing or bathing?: No Independently performs ADLs?: Yes (appropriate for developmental age) Does the patient have difficulty walking or climbing stairs?: Yes Weakness of Legs: Both Weakness of Arms/Hands: None  Permission Sought/Granted Permission sought to share information with : Case Manager, Family Supports Permission granted to share information with : Yes, Verbal Permission Granted  Share Information with NAME: Garnet Koyanagi (daughter)     Permission granted to share info w Relationship: daughters  Permission granted to share info w Contact Information: [phone]  Emotional Assessment Appearance:: Appears stated age Attitude/Demeanor/Rapport: Gracious Affect (typically observed): Accepting Orientation: : Oriented to Self, Oriented to Place, Oriented to  Time, Oriented to Situation Alcohol / Substance Use: Alcohol Use, Tobacco Use(states PTA smokes 1 cig  every 2 hrs) Psych Involvement: No (comment)  Admission diagnosis:  Butt Pain Patient Active Problem List   Diagnosis Date Noted  . Anemia 08/16/2018  . Palliative care by specialist   . Cellulitis of lower extremity   . Peripheral edema   .  Atrial fibrillation with RVR (HCC) 06/13/2018  . Alcohol dependence (HCC) 05/06/2018  . Electrolyte disturbance 05/05/2018  . Alcohol withdrawal delirium (HCC) 04/28/2018  . Weakness generalized 04/27/2018  . Gait abnormality 04/24/2018  . Chronic hypoxemic respiratory failure (HCC)  04/11/2018  . Atrial fibrillation with rapid ventricular response (HCC) 03/23/2018  . Head injury   . Fall 03/19/2018  . Syncope 03/19/2018  . Orbital fracture 03/19/2018  . Scalp laceration 03/19/2018  . Acute respiratory failure with hypoxia and hypercarbia (HCC) 03/19/2018  . Abdominal tenderness 03/19/2018  . Acute on chronic respiratory failure with hypoxia (HCC) 03/17/2018  . Alcohol abuse with intoxication (HCC) 03/17/2018  . ETOH abuse   . Normocytic anemia 03/16/2018  . Acute metabolic encephalopathy 03/16/2018  . Lower extremity edema 03/16/2018  . Migraine 03/16/2018  . Community acquired pneumonia of left lower lobe of lung (HCC)   . Weakness 08/21/2017  . COPD exacerbation (HCC) 06/12/2017  . Chest pain with moderate risk for cardiac etiology 05/30/2017  . Abnormal LFTs 05/30/2017  . Tachycardia 05/30/2017  . COPD (chronic obstructive pulmonary disease) (HCC) 04/19/2017  . Right rib fracture 04/15/2017  . Abdominal wall cellulitis 12/30/2016  . S/P exploratory laparotomy 12/30/2016  . Distal radius fracture, left 09/09/2016  . Thrombocytopenia (HCC) 09/09/2016  . Chest x-ray abnormality   . Umbilical hernia without obstruction and without gangrene 05/12/2016  . Itching 02/24/2016  . Ventral hernia without obstruction or gangrene 02/24/2016  . Goals of care, counseling/discussion   . Ascites   . Tachypnea   . Atrial flutter with rapid ventricular response (HCC)   . Hypomagnesemia   . Hypoxia   . Dehydration 01/22/2016  . Low back ache 12/30/2015  . Non-intractable vomiting with nausea   . Alcohol abuse with alcohol-induced mood disorder (HCC) 12/15/2015  . Acute alcoholism (HCC)   . Distended abdomen 11/04/2015  . Alcohol abuse   . Dyspnea   . Acute respiratory failure (HCC) 10/15/2015  . Ascites due to alcoholic cirrhosis (HCC) 10/15/2015  . Osteoporosis   . Multiple sclerosis (HCC)   . Alcoholic cirrhosis of liver with ascites (HCC) 08/22/2015  .  Bipolar disorder, current episode mixed, moderate (HCC) 08/01/2015  . Alcohol use disorder, severe, dependence (HCC) 07/31/2015  . Macrocytic anemia- due to alcohol abuse with normal B12 & folate levels 05/31/2015  . Severe protein-calorie malnutrition (HCC) 05/31/2015  . Hypokalemia 05/26/2015  . Alcohol withdrawal (HCC) 05/18/2015  . Abdominal pain 05/18/2015  . Anxiety 05/18/2015  . Stimulant abuse (HCC) 12/27/2014  . Nicotine abuse 12/27/2014  . Hyperprolactinemia (HCC) 11/15/2014  . Dyslipidemia   . Alcohol use disorder, moderate, dependence (HCC) 10/30/2014  . Tobacco abuse 01/23/2014  . Bipolar 1 disorder (HCC) 04/07/2013  . Noncompliance with therapeutic plan 04/04/2013  . Hereditary and idiopathic peripheral neuropathy 05/01/2012  . Depression 05/01/2012  . GERD (gastroesophageal reflux disease) 05/01/2012  . Osteoarthrosis, unspecified whether generalized or localized, involving lower leg 05/01/2012  . Hyponatremia 07/01/2011   PCP:  Patient, No Pcp Per Pharmacy:   [id number] - [place], [place] - [address] [place] [id number] Phone: [phone] Fax: [phone]     Social Determinants of Health (SDOH) Interventions    Readmission Risk Interventions No flowsheet data found.

## 2018-08-18 NOTE — Progress Notes (Signed)
Physical Therapy Treatment Patient Details Name: CLAREEN OBOYLE MRN: [id number] DOB: Jun 12, 1960 Today's Date: 08/18/2018    History of Present Illness 58 y.o. female with medical history significant of alcoholic cirrhosis with varices and chronic ascites, COPD, bipolar disorder, MS, paroxysmal atrial fibrillation not anticoagulated, smoking, chronic respiratory failure on 2-3 L home oxygen. Pt admitted with sepsis secondary to UTI with possible pnuemonia, and B LE cellulitis.    PT Comments    Pt supine on arrival agreeable to mobility. Pt transferred to [facility] then refused to attempt gait, HEP or further activity stating she was tired and wanted to sleep. Pt agreeable to OOB to chair due to soiled bed but adamantly refused further participation or questions. Pt required max assist for pericare and demonstrates self-limiting behavior. Will continue to follow to increase mobility and function.   Pt on 2L throughout with sats 95%   Follow Up Recommendations  SNF     Equipment Recommendations  None recommended by PT    Recommendations for Other Services       Precautions / Restrictions Precautions Precautions: Fall Restrictions Weight Bearing Restrictions: No    Mobility  Bed Mobility Overal bed mobility: Needs Assistance Bed Mobility: Supine to Sit     Supine to sit: Supervision     General bed mobility comments: pt able to transition from supine to sit with rail without assist. Once sitting stated need to void with incontinent mucousy BM during transfers to [facility]  Transfers Overall transfer level: Needs assistance   Transfers: Stand Pivot Transfers;Sit to/from Stand Sit to Stand: Min guard Stand pivot transfers: Min guard       General transfer comment: guarding for safety and O2 line with pt able to transfer bed to [facility] then stand from [facility] to walk short distance 6' to chair with RW  Ambulation/Gait Ambulation/Gait assistance: Min guard Gait Distance (Feet): 6  Feet Assistive device: Rolling walker (2 wheeled) Gait Pattern/deviations: Step-through pattern;Decreased stride length   Gait velocity interpretation: 1.31 - 2.62 ft/sec, indicative of limited community ambulator General Gait Details: pt walked 6' from [facility] to recliner and refused to attempt any further gait. Pt wanted to return to bed but bed stripped due to incontinent BM and therefore pt agreeable to sitting in chair   Stairs             Wheelchair Mobility    Modified Rankin (Stroke Patients Only)       Balance Overall balance assessment: Needs assistance   Sitting balance-Leahy Scale: Good       Standing balance-Leahy Scale: Fair Standing balance comment: pt able to stand without UE support to rise from bed and pivot to bSC, RW for gait                            Cognition   Behavior During Therapy: Flat affect Overall Cognitive Status: Difficult to assess Area of Impairment: Safety/judgement                         Safety/Judgement: Decreased awareness of safety     General Comments: pt with flat affect, refuses to answer further questions and will not state how far she walks at baseline. pt sitting prematurely despite cues      Exercises      General Comments        Pertinent Vitals/Pain Pain Assessment: No/denies pain    Home Living  Prior Function            PT Goals (current goals can now be found in the care plan section) Progress towards PT goals: Progressing toward goals(pt with slight progression but limited by pt willingness to participate)    Frequency    Min 2X/week      PT Plan Current plan remains appropriate    Co-evaluation              AM-PAC PT "6 Clicks" Mobility   Outcome Measure  Help needed turning from your back to your side while in a flat bed without using bedrails?: A Little Help needed moving from lying on your back to sitting on the side of a flat  bed without using bedrails?: A Little Help needed moving to and from a bed to a chair (including a wheelchair)?: A Little Help needed standing up from a chair using your arms (e.g., wheelchair or bedside chair)?: A Little Help needed to walk in hospital room?: A Little Help needed climbing 3-5 steps with a railing? : A Lot 6 Click Score: 17    End of Session Equipment Utilized During Treatment: Oxygen;Gait belt Activity Tolerance: Other (comment)(pt reports she is tired, self limiting and refusing to participate in further activity or answer questions regarding function) Patient left: in chair;with call bell/phone within reach;with chair alarm set Nurse Communication: Mobility status PT Visit Diagnosis: Unsteadiness on feet (R26.81);Other abnormalities of gait and mobility (R26.89);Muscle weakness (generalized) (M62.81);Difficulty in walking, not elsewhere classified (R26.2)     Time: 0623-7628 PT Time Calculation (min) (ACUTE ONLY): 14 min  Charges:  $Therapeutic Activity: 8-22 mins                     [place], PT Acute Rehabilitation Services Pager: [phone] Office: [place] 08/18/2018, 1:00 PM

## 2018-08-18 NOTE — Progress Notes (Signed)
Occupational Therapy Treatment Patient Details Name: Anne Black MRN: [id number] DOB: 1960/12/02 Today's Date: 08/18/2018    History of present illness 58 y.o. female with medical history significant of alcoholic cirrhosis with varices and chronic ascites, COPD, bipolar disorder, MS, paroxysmal atrial fibrillation not anticoagulated, smoking, chronic respiratory failure on 2-3 L home oxygen. Pt admitted with sepsis secondary to UTI with possible pnuemonia, and B LE cellulitis.   OT comments  Pt seen for initiation of general UB strengthening HEP, given level 1 theraband. Pt declined return demo. Pt declined OOB this session, reports she recently returned from a procedure. D/c plan remains appropriate.    Follow Up Recommendations  SNF    Equipment Recommendations  3 in 1 bedside commode    Recommendations for Other Services      Precautions / Restrictions Precautions Precautions: Fall Restrictions Weight Bearing Restrictions: No       Mobility Bed Mobility Overal bed mobility: Needs Assistance Bed Mobility: Supine to Sit     Supine to sit: Supervision     General bed mobility comments: pt declined OOB this session. recently returned from procedure  Transfers Overall transfer level: Needs assistance   Transfers: Stand Pivot Transfers;Sit to/from Stand Sit to Stand: Min guard Stand pivot transfers: Min guard       General transfer comment: guarding for safety and O2 line with pt able to transfer bed to [facility] then stand from [facility] to walk short distance 6' to chair with RW    Balance Overall balance assessment: Needs assistance   Sitting balance-Leahy Scale: Good       Standing balance-Leahy Scale: Fair Standing balance comment: pt able to stand without UE support to rise from bed and pivot to bSC, RW for gait                           ADL either performed or assessed with clinical judgement   ADL                                               Vision       Perception     Praxis      Cognition Arousal/Alertness: Lethargic Behavior During Therapy: Flat affect Overall Cognitive Status: Difficult to assess Area of Impairment: Safety/judgement                         Safety/Judgement: Decreased awareness of safety     General Comments: flat affect, difficult to engage at times        Exercises Exercises: Other exercises Other Exercises Other Exercises: Pt provided with level 1 theraband and given verbal and visual instruction on 3 basic strengthening exercises. Pt declined return demo.    Shoulder Instructions       General Comments      Pertinent Vitals/ Pain       Pain Assessment: No/denies pain Faces Pain Scale: Hurts even more Pain Location: abdomen Pain Descriptors / Indicators: Sore;Tiring Pain Intervention(s): Monitored during session;Limited activity within patient's tolerance  Home Living                                          Prior Functioning/Environment  Frequency  Min 2X/week        Progress Toward Goals  OT Goals(current goals can now be found in the care plan section)  Progress towards OT goals: Progressing toward goals  Acute Rehab OT Goals Patient Stated Goal: to go to the bathroom OT Goal Formulation: With patient Time For Goal Achievement: 08/26/18 Potential to Achieve Goals: Good ADL Goals Pt Will Perform Grooming: with modified independence;sitting Pt Will Perform Upper Body Dressing: with set-up;sitting Pt Will Perform Lower Body Dressing: with min assist;with adaptive equipment;sit to/from stand;sitting/lateral leans Pt Will Transfer to Toilet: with set-up;stand pivot transfer;bedside commode Pt Will Perform Toileting - Clothing Manipulation and hygiene: with min assist;sit to/from stand Additional ADL Goal #1: Pt will stand x3 mins for OOB ADL with fair balance and set-upA for task.  Plan Discharge plan  remains appropriate;Frequency remains appropriate    Co-evaluation                 AM-PAC OT "6 Clicks" Daily Activity     Outcome Measure   Help from another person eating meals?: None Help from another person taking care of personal grooming?: A Little Help from another person toileting, which includes using toliet, bedpan, or urinal?: Total Help from another person bathing (including washing, rinsing, drying)?: A Lot Help from another person to put on and taking off regular upper body clothing?: A Little Help from another person to put on and taking off regular lower body clothing?: Total 6 Click Score: 14    End of Session    OT Visit Diagnosis: Unsteadiness on feet (R26.81);Muscle weakness (generalized) (M62.81);Adult, failure to thrive (R62.7)   Activity Tolerance     Patient Left in bed;with call bell/phone within reach;with bed alarm set   Nurse Communication          Time: 1610-9604 OT Time Calculation (min): 8 min  Charges: OT General Charges $OT Visit: 1 Visit OT Treatments $Therapeutic Exercise: 8-22 mins  Tyrone Schimke, OT Acute Rehabilitation Services Pager: [phone] Office: [phone]    [name] 08/18/2018, 1:54 PM

## 2018-08-19 ENCOUNTER — Inpatient Hospital Stay (HOSPITAL_COMMUNITY): Payer: Medicare Other

## 2018-08-19 LAB — NOVEL CORONAVIRUS, NAA (HOSP ORDER, SEND-OUT TO REF LAB; TAT 18-24 HRS): SARS-CoV-2, NAA: NOT DETECTED

## 2018-08-19 MED ORDER — LACTULOSE 10 GM/15ML PO SOLN
20.0000 g | Freq: Two times a day (BID) | ORAL | Status: DC
  Filled 2018-08-19: qty 30

## 2018-08-19 MED ORDER — LACTULOSE 10 GM/15ML PO SOLN: 10.0000 g | Freq: Every day | ORAL | Status: DC | PRN

## 2018-08-19 NOTE — Progress Notes (Signed)
PROGRESS NOTE  Anne Black TKZ:[id number] DOB: 01/29/1961 DOA: 08/10/2018 PCP: Patient, No Pcp Per  Brief History   58 year old woman PMH alcoholic cirrhosis, varices, chronic ascites, COPD, bipolar disorder, multiple sclerosis, chronic hypoxic respiratory failure on 2-3 L nasal cannula, presented from home, found to be mildly hypothermic and hypotensive.  Admitted for suspected sepsis secondary to possible UTI or pneumonia or SBP.  House was reportedly in [place].  A & P  Sepsis unclear etiology, resolved.  UTI was considered but culture was unrevealing, chest x-ray was equivocal and SBP was considered but culture data was negative. --Resolved, remains stable off antibiotics.  Decompensated alcoholic cirrhosis, with associated ascites, hyponatremia, hepatic encephalopathy with reported ongoing alcohol use --Improved status post large-volume paracentesis yesterday --Continue lactulose, spironolactone  COPD on 2-3 L home oxygen --Stable.  Continue supplemental oxygen.  Acute kidney injury, resolved  Acute on chronic hyponatremia, improved, stable --Secondary to known cirrhosis  Anemia of chronic disease, baseline somewhere around 8-9. --Appropriately increased status post transfusion 1 unit PRBC yesterday.  Monitor clinically.  Paroxysmal atrial fibrillation, not a candidate for long-term anticoagulation given active alcohol use --Stable.  Alcohol use disorder, no evidence of withdrawal --Appears stable.  Continue thiamine, folate, multivitamin. CIWA expired  PMH multiple sclerosis, not currently on any treatment   Somewhat worse today, nauseous, feels poorly.  Will monitor to ensure tolerance of diet.  Anticipate discharge 7/12 if stable.   DVT prophylaxis: SCDs Code Status: Full Family Communication: called mother at home and cell, no answer Disposition Plan: SNF    Murray Hodgkins, MD  [facility] Direct contact: see [email or web address] (further directions  at bottom of note if needed) 7PM-7AM contact night coverage as at bottom of note 08/19/2018, 4:09 PM  LOS: 9 days   Consultants  . Palliative medicine  Procedures  . 7/2 diagnostic paracentesis in the emergency department . 7/4 ultrasound-guided paracentesis 2.7 L . 7/6 ultrasound-guided paracentesis 5.7 L . 7/10 transfusion 1 unit PRBC  Antibiotics  .   Interval History/Subjective  Feels poorly today.  Nauseous but eating breakfast.  Reports mild abdominal discomfort.  Nurse reported that she looked short of breath later in the day.  Reports hard stools.  Objective   Vitals:  Vitals:   08/19/18 1001 08/19/18 1214  BP: (!) 110/44 (!) 99/45  Pulse: 82 75  Resp:  18  Temp:  98.2 F (36.8 C)  SpO2: 100% 100%    Exam:  Constitutional:   . Appears calm, uncomfortable, nontoxic Respiratory:  . CTA bilaterally, no w/r/r.  . Respiratory effort normal.  Cardiovascular:  . RRR, no m/r/g . No LE extremity edema   Abdomen:  . Soft, nondistended, possibly mild tenderness in the upper abdomen Skin:  . No rashes, lesions, ulcers noted Psychiatric:  . Mental status o Mood, affect appropriate  I have personally reviewed the following:   Today's Data  . Chest x-ray independently reviewed, no acute disease  Lab Data  . From this hospitalization, reviewed  Micro Data  . Urine culture insignificant growth . Blood culture 1/2 with contaminant . Peritoneal fluid culture no growth  Imaging  . Chest x-ray on admission suggested cardiomegaly with worsening volume overload, retrocardiac opacity atelectasis versus infiltrate.  Cardiology Data  . EKG sinus rhythm, no acute changes  Other Data  .   Scheduled Meds: . folic acid  1 mg Oral Daily  . furosemide  40 mg Oral Daily  . gabapentin  300 mg Oral TID  .  Gerhardt's butt cream   Topical BID  . lactulose  10 g Oral BID  . multivitamin with minerals  1 tablet Oral Daily  . nicotine  21 mg Transdermal Daily  .  pantoprazole  40 mg Oral Daily  . spironolactone  50 mg Oral Daily  . thiamine  100 mg Oral Daily   Or  . thiamine  100 mg Intravenous Daily   Continuous Infusions: . sodium chloride 1,000 mL (08/13/18 0305)    Principal Problem:   Alcoholic cirrhosis of liver with ascites (HCC) Active Problems:   Goals of care, counseling/discussion   Palliative care by specialist   Anemia   LOS: 9 days   How to contact the [facility] Attending or Consulting provider 7A - 7P or covering provider during after hours 7P -7A, for this patient?  1. Check the care team in [facility] and look for a) attending/consulting [facility] provider listed and b) the [facility] team listed 2. Log into [email or web address] and use [place]'s universal password to access. If you do not have the password, please contact the hospital operator. 3. Locate the [facility] provider you are looking for under [facility] and page to a number that you can be directly reached. 4. If you still have difficulty reaching the provider, please page the [facility] (Director on Call) for the Hospitalists listed on amion for assistance.

## 2018-08-19 NOTE — Progress Notes (Signed)
Update on patient-  Still no relief on pain or nausea.  Gave tylenol at 1130.   MD aware-  Also call Social work to get update on bed availability.   Awaiting return call.

## 2018-08-19 NOTE — Progress Notes (Signed)
Spoke to patient's mother-  Gave her update.  Plan to send to SNF tomorrow if patient doing well.   May need to redo Covid test if any further delays.    Spoke to SW- waiting on conformation

## 2018-08-19 NOTE — Progress Notes (Signed)
Patient sitting up in bed pulling scabs off sores causing them to bleed.This nurse asked patient to stop and patient verbalized she would do so.Bandaid applied to bleeding sores.Will continue to monitor.

## 2018-08-19 NOTE — Progress Notes (Signed)
Patient feeling some relief with nausea- has eating 75% of lunch along with 3 cereals.   Patient states pain is still at a  7 out of 10.   Patient seem to be breathing better (more comfortable)

## 2018-08-19 NOTE — Plan of Care (Signed)
  Problem: Clinical Measurements: Goal: Will remain free from infection Outcome: Completed/Met Goal: Respiratory complications will improve Outcome: Completed/Met Goal: Cardiovascular complication will be avoided Outcome: Completed/Met   Problem: Activity: Goal: Risk for activity intolerance will decrease Outcome: Completed/Met   Problem: Nutrition: Goal: Adequate nutrition will be maintained Outcome: Completed/Met   Problem: Elimination: Goal: Will not experience complications related to bowel motility Outcome: Completed/Met Goal: Will not experience complications related to urinary retention Outcome: Completed/Met   Problem: Skin Integrity: Goal: Risk for impaired skin integrity will decrease Outcome: Completed/Met

## 2018-08-19 NOTE — Progress Notes (Signed)
Spoke to MD- patient not feeling well - having generalized Soreness and nausea .   Gave some Zofran; tylenol not due until 1040.     Per MD- will see how she feels at lunch.

## 2018-08-20 MED ORDER — THIAMINE HCL 100 MG PO TABS: 100.0000 mg | ORAL_TABLET | Freq: Every day | ORAL | Status: AC

## 2018-08-20 MED ORDER — LACTULOSE 10 GM/15ML PO SOLN: 20.0000 g | Freq: Two times a day (BID) | ORAL | Status: AC

## 2018-08-20 MED ORDER — FOLIC ACID 1 MG PO TABS: 1.0000 mg | ORAL_TABLET | Freq: Every day | ORAL | Status: DC

## 2018-08-20 MED ORDER — ADULT MULTIVITAMIN W/MINERALS CH: 1.0000 | ORAL_TABLET | Freq: Every day | ORAL | Status: AC

## 2018-08-20 MED ORDER — LORAZEPAM 0.5 MG PO TABS: 0.5000 mg | ORAL_TABLET | Freq: Two times a day (BID) | ORAL | 0 refills | Status: AC | PRN

## 2018-08-20 NOTE — Discharge Summary (Signed)
Physician Discharge Summary  Anne Black QMV:[id number] DOB: [birth year] DOA: 08/10/2018  PCP: Patient, No Pcp Per  Admit date: 08/10/2018 Discharge date: 08/20/2018  Recommendations for Outpatient Follow-up:   Decompensated alcoholic cirrhosis, with associated ascites, hyponatremia, hepatic encephalopathy with reported ongoing alcohol use --Continue lactulose and spironolactone.  Titrate lactulose for 1 or more bowel movements per day. --Consider referral to GI as an outpatient  Paroxysmal atrial fibrillation, not a candidate for long-term anticoagulation given active alcohol use --Stable during this hospitalization.  Remains in sinus rhythm without rate control agent.  Given soft blood pressures throughout hospitalization, will hold Cardizem on discharge.  Alcohol use disorder, no evidence of withdrawal --Continue thiamine, folate, multivitamin.  Continue to encourage abstinence.  Contact information for after-discharge care    Destination    [facility] Preferred SNF .   Service: Skilled Nursing Contact information: [address] [place] [phone]               Discharge Diagnoses: Principal diagnosis is #1 1. Sepsis unclear etiology, resolved.  UTI was considered but culture was unrevealing, chest x-ray was equivocal and SBP was considered but culture data was negative. 2. Decompensated alcoholic cirrhosis, with associated ascites, hyponatremia, hepatic encephalopathy with reported ongoing alcohol use 3. COPD on 2-3 L home oxygen 4. Acute kidney injury, resolved 5. Acute on chronic hyponatremia, improved, stable 6. Anemia of chronic disease, baseline somewhere around 8-9. 7. Paroxysmal atrial fibrillation, not a candidate for long-term anticoagulation given active alcohol use 8. Alcohol use disorder, no evidence of withdrawal  Discharge Condition: improved Disposition: SNF  Diet recommendation: Heart healthy  Filed  Weights   08/18/18 0400 08/19/18 0500 08/20/18 0500  Weight: 83.1 kg 75.9 kg 75 kg    History of present illness:  58 year old woman PMH alcoholic cirrhosis, varices, chronic ascites, COPD, bipolar disorder, multiple sclerosis, chronic hypoxic respiratory failure on 2-3 L nasal cannula, presented from home, found to be mildly hypothermic and hypotensive.  Admitted for suspected sepsis secondary to possible UTI or pneumonia or SBP.  House was reportedly in disarray and filthy.  Hospital Course:  Patient was treated with empiric antibiotics with gradual clinical improvement.  Culture data was unrevealing.  There was no evidence of SBP and urine culture was unrevealing.  She required several large-volume paracenteses but appears to be stable at this point.  COPD and other chronic comorbidities remained for the most part stable.  She did require 1 unit PRBC for anemia of acute illness.  Short-term rehab was recommended.  Individual issues as below.  Sepsis unclear etiology, resolved.  UTI was considered but culture was unrevealing, chest x-ray was equivocal and SBP was considered but culture data was negative. --Resolved.  Stable off antibiotics.  Afebrile.  Hemodynamic stable.  Decompensated alcoholic cirrhosis, with associated ascites, hyponatremia, hepatic encephalopathy with reported ongoing alcohol use --Appears stable.  Continue lactulose and spironolactone.  COPD on 2-3 L home oxygen --Remained stable, continue supplemental oxygen.  Acute kidney injury, resolved  Acute on chronic hyponatremia, improved, stable --Secondary to known cirrhosis  Anemia of chronic disease, baseline somewhere around 8-9. --Appropriately increased status post transfusion 1 unit PRBC.    Paroxysmal atrial fibrillation, not a candidate for long-term anticoagulation given active alcohol use --Stable during this hospitalization.  Remains in sinus rhythm without rate control agent.  Given soft blood  pressures throughout hospitalization, will hold Cardizem on discharge.  Alcohol use disorder, no evidence of withdrawal --Appears stable.  Continue thiamine, folate, multivitamin.  PMH multiple sclerosis, not currently on any treatment  Consultants   Palliative medicine  Procedures   7/2 diagnostic paracentesis in the emergency department  7/4 ultrasound-guided paracentesis 2.7 L  7/6 ultrasound-guided paracentesis 5.7 L  7/10 transfusion 1 unit PRBC  Today's assessment: S: Feels okay today.  Reports some nausea but did tolerate breakfast and also meals yesterday.  Some mild abdominal discomfort.  Breathing unremarkable. O: Vitals:  Vitals:   08/20/18 0500 08/20/18 0851  BP: (!) 105/54 (!) 95/55  Pulse: (!) 56 92  Resp: 18 18  Temp: 98 F (36.7 C)   SpO2: 99% 94%    Constitutional:   Appears calm and comfortable ENMT:   grossly normal hearing  Respiratory:   CTA bilaterally, no w/r/r.   Respiratory effort normal.  Cardiovascular:   RRR, no m/r/g  No LE extremity edema   Abdomen:   Soft, mild distention, no definite tenderness, exam inconsistent Skin:   No rashes, ulcers noted Psychiatric:   Mental status o Mood, affect appropriate o Alert, oriented to location, month, year  Chest x-ray independently reviewed from 7/11, no acute disease  Discharge Instructions   Allergies as of 08/20/2018   No Known Allergies     Medication List    STOP taking these medications   acetaminophen 500 MG tablet Commonly known as: TYLENOL   diltiazem 120 MG 24 hr capsule Commonly known as: CARDIZEM CD   famotidine 20 MG tablet Commonly known as: PEPCID   methocarbamol 500 MG tablet Commonly known as: ROBAXIN     TAKE these medications   folic acid 1 MG tablet Commonly known as: FOLVITE Take 1 tablet (1 mg total) by mouth daily. Start taking on: August 21, 2018   furosemide 40 MG tablet Commonly known as: LASIX Take 1 tablet (40 mg total) by  mouth daily.   gabapentin 100 MG capsule Commonly known as: NEURONTIN Take 2 capsules (200 mg total) by mouth 3 (three) times daily for 30 days.   hydrOXYzine 25 MG tablet Commonly known as: ATARAX/VISTARIL Take 1 tablet (25 mg total) by mouth daily as needed for up to 30 doses (anxiety).   lactulose 10 GM/15ML solution Commonly known as: CHRONULAC Take 30 mLs (20 g total) by mouth 2 (two) times daily.   LORazepam 0.5 MG tablet Commonly known as: ATIVAN Take 1 tablet (0.5 mg total) by mouth every 12 (twelve) hours as needed for anxiety.   multivitamin with minerals Tabs tablet Take 1 tablet by mouth daily. Start taking on: August 21, 2018   ondansetron 4 MG tablet Commonly known as: ZOFRAN Take 1 tablet (4 mg total) by mouth every 6 (six) hours. What changed: Another medication with the same name was removed. Continue taking this medication, and follow the directions you see here.   pantoprazole 20 MG tablet Commonly known as: PROTONIX Take 2 tablets (40 mg total) by mouth daily. What changed: Another medication with the same name was removed. Continue taking this medication, and follow the directions you see here.   potassium chloride SA 20 MEQ tablet Commonly known as: K-DUR Take 2 tablets (40 mEq total) by mouth 2 (two) times daily.   spironolactone 25 MG tablet Commonly known as: ALDACTONE Take 1 tablet (25 mg total) by mouth daily for 30 days.   thiamine 100 MG tablet Take 1 tablet (100 mg total) by mouth daily. Start taking on: August 21, 2018      No Known Allergies  The results of significant diagnostics from  this hospitalization (including imaging, microbiology, ancillary and laboratory) are listed below for reference.    Significant Diagnostic Studies: US Paracentesis  Result Date: 08/12/2018 INDICATION: Patient with recurrent ascites. Request made for diagnostic and therapeutic paracentesis. EXAM: ULTRASOUND GUIDED DIAGNOSTIC AND THERAPEUTIC PARACENTESIS  MEDICATIONS: 10 mL 1% lidocaine COMPLICATIONS: None immediate. PROCEDURE: Informed written consent was obtained from the patient after a discussion of the risks, benefits and alternatives to treatment. A timeout was performed prior to the initiation of the procedure. Initial ultrasound scanning demonstrates a moderate amount of ascites within the right lower abdominal quadrant. The right lower abdomen was prepped and draped in the usual sterile fashion. 1% lidocaine was used for local anesthesia. Following this, a 19 gauge, 7-cm, Yueh catheter was introduced. An ultrasound image was saved for documentation purposes. The paracentesis was performed. The catheter was removed and a dressing was applied. The patient tolerated the procedure well without immediate post procedural complication. FINDINGS: A total of approximately 2.7 liters of clear, yellow fluid was removed. Samples were sent to the laboratory as requested by the clinical team. Procedure stopped prior to removal of all fluid due to hypotension. IMPRESSION: Successful ultrasound-guided diagnostic and therapeutic paracentesis yielding 2.7 liters of peritoneal fluid. Read by: Loyce Dys PA-C Electronically Signed   By: Judie Petit.  Shick M.D.   On: 08/12/2018 14:07   Dg Chest Port 1 View  Result Date: 08/19/2018 CLINICAL DATA:  Shortness of breath. EXAM: PORTABLE CHEST 1 VIEW COMPARISON:  August 10, 2018 FINDINGS: Cardiomediastinal silhouette is normal. Mediastinal contours appear intact. There is no evidence of focal airspace consolidation, pleural effusion or pneumothorax. Osseous structures are without acute abnormality. Soft tissues are grossly normal. IMPRESSION: No active disease. Electronically Signed   By: Ted Mcalpine M.D.   On: 08/19/2018 15:30   Dg Chest Portable 1 View  Result Date: 08/10/2018 CLINICAL DATA:  Shortness of breath.  Abdominal pain. EXAM: PORTABLE CHEST 1 VIEW COMPARISON:  July 23, 2018 FINDINGS: Heart size remains enlarged.  There are progressive interstitial lung markings. There is a new dense retrocardiac opacity. Hazy airspace opacities are noted bilaterally. There may be small bilateral pleural effusions. There is no acute osseous abnormality. No pneumothorax. IMPRESSION: 1. Cardiomegaly with findings suggestive of worsening volume overload. 2. Worsening retrocardiac opacity which may represent atelectasis or infiltrate. 3. Probable trace to small bilateral pleural effusions. Electronically Signed   By: Katherine Mantle M.D.   On: 08/10/2018 19:30   Dg Chest Port 1 View  Result Date: 07/23/2018 CLINICAL DATA:  Hypoxia EXAM: PORTABLE CHEST 1 VIEW COMPARISON:  06/30/2018 FINDINGS: Heart and mediastinal contours are within normal limits. No focal opacities or effusions. No acute bony abnormality. IMPRESSION: No active disease. Electronically Signed   By: Charlett Nose M.D.   On: 07/23/2018 19:31   [place] Ascites (abdomen Limited)  Result Date: 08/11/2018 CLINICAL DATA:  58 year old female with hepatic cirrhosis, portal hypertension and large volume ascites EXAM: LIMITED ABDOMEN ULTRASOUND FOR ASCITES TECHNIQUE: Limited ultrasound survey for ascites was performed in all four abdominal quadrants. COMPARISON:  CT scan of the abdomen and pelvis 07/19/2018 FINDINGS: Sonographic interrogation of the 4 quadrants of the abdomen demonstrates large volume ascites. The patient refused paracentesis. IMPRESSION: Large volume ascites. Patient refused paracentesis. Electronically Signed   By: Malachy Moan M.D.   On: 08/11/2018 11:37   Ir Paracentesis  Result Date: 08/18/2018 INDICATION: Patient with history of abdominal distension, recurrent ascites. Request made for paracentesis. EXAM: ULTRASOUND GUIDED THERAPEUTIC PARACENTESIS MEDICATIONS: 10 mL 1% lidocaine  COMPLICATIONS: None immediate. PROCEDURE: Informed written consent was obtained from the patient after a discussion of the risks, benefits and alternatives to treatment. A timeout  was performed prior to the initiation of the procedure. Initial ultrasound scanning demonstrates a large amount of ascites within the right lower abdominal quadrant. The right lower abdomen was prepped and draped in the usual sterile fashion. 1% lidocaine was used for local anesthesia. Following this, a 19 gauge, 7-cm, Yueh catheter was introduced. An ultrasound image was saved for documentation purposes. The paracentesis was performed. The catheter was removed and a dressing was applied. The patient tolerated the procedure well without immediate post procedural complication. FINDINGS: A total of approximately 3.7 liters of yellow fluid was removed. IMPRESSION: Successful ultrasound-guided therapeutic paracentesis yielding 3.7 liters of peritoneal fluid. Read by: Loyce DysKacie Matthews PA-C Electronically Signed   By: Gilmer MorJaime  Wagner D.O.   On: 08/18/2018 15:05   Ir Paracentesis  Result Date: 08/14/2018 INDICATION: Patient with history of alcoholic cirrhosis with recurrent ascites. Request is made for therapeutic paracentesis. EXAM: ULTRASOUND GUIDED THERAPEUTIC PARACENTESIS MEDICATIONS: 10 mL 1% lidocaine COMPLICATIONS: None immediate. PROCEDURE: Informed written consent was obtained from the patient after a discussion of the risks, benefits and alternatives to treatment. A timeout was performed prior to the initiation of the procedure. Initial ultrasound scanning demonstrates a large amount of ascites within the right lower abdominal quadrant. The right lower abdomen was prepped and draped in the usual sterile fashion. 1% lidocaine was used for local anesthesia. Following this, a 19 gauge, 7-cm, Yueh catheter was introduced. An ultrasound image was saved for documentation purposes. The paracentesis was performed. The catheter was removed and a dressing was applied. The patient tolerated the procedure well without immediate post procedural complication. FINDINGS: A total of approximately 5.7 L of clear gold fluid was  removed. IMPRESSION: Successful ultrasound-guided paracentesis yielding 5.7 L of peritoneal fluid. Read by: Elwin MochaAlexandra Louk, PA-C Electronically Signed   By: Richarda OverlieAdam  Henn M.D.   On: 08/14/2018 12:27    Microbiology: Recent Results (from the past 240 hour(s))  Blood culture (routine x 2)     Status: None   Collection Time: 08/10/18  6:10 PM   Specimen: BLOOD RIGHT HAND  Result Value Ref Range Status   Specimen Description BLOOD RIGHT HAND  Final   Special Requests   Final    BOTTLES DRAWN AEROBIC AND ANAEROBIC Blood Culture adequate volume   Culture   Final    NO GROWTH 5 DAYS Performed at [facility], [address] [place], [place] [address]    Report Status 08/15/2018 FINAL  Final  Blood culture (routine x 2)     Status: Abnormal   Collection Time: 08/10/18  6:15 PM   Specimen: BLOOD RIGHT HAND  Result Value Ref Range Status   Specimen Description BLOOD RIGHT HAND  Final   Special Requests   Final    BOTTLES DRAWN AEROBIC AND ANAEROBIC Blood Culture results may not be optimal due to an inadequate volume of blood received in culture bottles   Culture  Setup Time   Final    AEROBIC BOTTLE ONLY GRAM POSITIVE RODS CRITICAL RESULT CALLED TO, READ BACK BY AND VERIFIED WITH: T DANG PHARMD 08/11/18 1832 JDW ANAEROBIC BOTTLE ONLY GRAM POSITIVE COCCI CRITICAL RESULT CALLED TO, READ BACK BY AND VERIFIED WITHMelven Sartorius: J. LEDFORD, Jacklynn BuePHARMD [id number] 08/12/2018 Girtha Hake. TYSOR Performed at [facility], [address] [place], [place] [address]    Culture (A)  Final  DIPHTHEROIDS(CORYNEBACTERIUM SPECIES) Standardized susceptibility testing for this organism is not available. PEPTOSTREPTOCOCCUS SPECIES    Report Status 08/15/2018 FINAL  Final  Blood Culture ID Panel (Reflexed)     Status: None   Collection Time: 08/10/18  6:15 PM  Result Value Ref Range Status   Enterococcus species NOT DETECTED NOT DETECTED Final   Listeria monocytogenes NOT DETECTED NOT DETECTED Final   Staphylococcus species  NOT DETECTED NOT DETECTED Final   Staphylococcus aureus (BCID) NOT DETECTED NOT DETECTED Final   Streptococcus species NOT DETECTED NOT DETECTED Final   Streptococcus agalactiae NOT DETECTED NOT DETECTED Final   Streptococcus pneumoniae NOT DETECTED NOT DETECTED Final   Streptococcus pyogenes NOT DETECTED NOT DETECTED Final   Acinetobacter baumannii NOT DETECTED NOT DETECTED Final   Enterobacteriaceae species NOT DETECTED NOT DETECTED Final   Enterobacter cloacae complex NOT DETECTED NOT DETECTED Final   Escherichia coli NOT DETECTED NOT DETECTED Final   Klebsiella oxytoca NOT DETECTED NOT DETECTED Final   Klebsiella pneumoniae NOT DETECTED NOT DETECTED Final   Proteus species NOT DETECTED NOT DETECTED Final   Serratia marcescens NOT DETECTED NOT DETECTED Final   Haemophilus influenzae NOT DETECTED NOT DETECTED Final   Neisseria meningitidis NOT DETECTED NOT DETECTED Final   Pseudomonas aeruginosa NOT DETECTED NOT DETECTED Final   Candida albicans NOT DETECTED NOT DETECTED Final   Candida glabrata NOT DETECTED NOT DETECTED Final   Candida krusei NOT DETECTED NOT DETECTED Final   Candida parapsilosis NOT DETECTED NOT DETECTED Final   Candida tropicalis NOT DETECTED NOT DETECTED Final    Comment: Performed at [facility], [address], [place], [place] [address]  Culture, body fluid-bottle     Status: None   Collection Time: 08/10/18  8:30 PM   Specimen: Fluid  Result Value Ref Range Status   Specimen Description FLUID PLEURAL  Final   Special Requests BOTTLES DRAWN AEROBIC AND ANAEROBIC  Final   Culture   Final    NO GROWTH 5 DAYS Performed at [facility], [address], [place], [place] [address]    Report Status 08/15/2018 FINAL  Final  Gram stain     Status: None   Collection Time: 08/10/18  8:30 PM   Specimen: Fluid  Result Value Ref Range Status   Specimen Description FLUID PLEURAL  Final   Special Requests NONE  Final   Gram Stain   Final    FEW WBC  PRESENT, PREDOMINANTLY MONONUCLEAR NO ORGANISMS SEEN Performed at [facility], [address], [place], [place] [address]    Report Status 08/10/2018 FINAL  Final  SARS Coronavirus 2 (CEPHEID- Performed in [facility] hospital lab), Hosp Order     Status: None   Collection Time: 08/10/18  9:39 PM   Specimen: Nasopharyngeal Swab  Result Value Ref Range Status   SARS Coronavirus 2 NEGATIVE NEGATIVE Final    Comment: (NOTE) If result is NEGATIVE SARS-CoV-2 target nucleic acids are NOT DETECTED. The SARS-CoV-2 RNA is generally detectable in upper and lower  respiratory specimens during the acute phase of infection. The lowest  concentration of SARS-CoV-2 viral copies this assay can detect is 250  copies / mL. A negative result does not preclude SARS-CoV-2 infection  and should not be used as the sole basis for treatment or other  patient management decisions.  A negative result may occur with  improper specimen collection / handling, submission of specimen other  than nasopharyngeal swab, presence of viral mutation(s) within  the  areas targeted by this assay, and inadequate number of viral copies  (<250 copies / mL). A negative result must be combined with clinical  observations, patient history, and epidemiological information. If result is POSITIVE SARS-CoV-2 target nucleic acids are DETECTED. The SARS-CoV-2 RNA is generally detectable in upper and lower  respiratory specimens dur ing the acute phase of infection.  Positive  results are indicative of active infection with SARS-CoV-2.  Clinical  correlation with patient history and other diagnostic information is  necessary to determine patient infection status.  Positive results do  not rule out bacterial infection or co-infection with other viruses. If result is PRESUMPTIVE POSTIVE SARS-CoV-2 nucleic acids MAY BE PRESENT.   A presumptive positive result was obtained on the submitted specimen  and confirmed on repeat testing.   While 2019 novel coronavirus  (SARS-CoV-2) nucleic acids may be present in the submitted sample  additional confirmatory testing may be necessary for epidemiological  and / or clinical management purposes  to differentiate between  SARS-CoV-2 and other Sarbecovirus currently known to infect humans.  If clinically indicated additional testing with an alternate test  methodology [phone]) is advised. The SARS-CoV-2 RNA is generally  detectable in upper and lower respiratory sp ecimens during the acute  phase of infection. The expected result is Negative. Fact Sheet for Patients:  BoilerBrush.com.cy Fact Sheet for Healthcare Providers: https://pope.com/ This test is not yet approved or cleared by the [place] FDA and has been authorized for detection and/or diagnosis of SARS-CoV-2 by FDA under an Emergency Use Authorization (EUA).  This EUA will remain in effect (meaning this test can be used) for the duration of the COVID-19 declaration under Section 564(b)(1) of the Act, 21 U.S.C. section 360bbb-3(b)(1), unless the authorization is terminated or revoked sooner. Performed at [facility], [address] [place], [place] [address]   Urine Culture     Status: Abnormal   Collection Time: 08/11/18  3:24 AM   Specimen: Urine, Random  Result Value Ref Range Status   Specimen Description URINE, RANDOM  Final   Special Requests NONE  Final   Culture (A)  Final    <10,000 COLONIES/mL INSIGNIFICANT GROWTH Performed at [facility], [address], [place], [place] [address]    Report Status 08/12/2018 FINAL  Final  Culture, blood (routine x 2)     Status: None   Collection Time: 08/12/18  8:39 AM   Specimen: BLOOD  Result Value Ref Range Status   Specimen Description BLOOD SITE NOT SPECIFIED  Final   Special Requests   Final    BOTTLES DRAWN AEROBIC ONLY Blood Culture results may not be optimal due to an inadequate volume  of blood received in culture bottles   Culture   Final    NO GROWTH 5 DAYS Performed at [facility], [address] [place], [place] [address]    Report Status 08/17/2018 FINAL  Final  Culture, blood (routine x 2)     Status: None   Collection Time: 08/12/18  8:39 AM   Specimen: BLOOD  Result Value Ref Range Status   Specimen Description BLOOD SITE NOT SPECIFIED  Final   Special Requests   Final    BOTTLES DRAWN AEROBIC ONLY Blood Culture results may not be optimal due to an inadequate volume of blood received in culture bottles   Culture   Final    NO GROWTH 5 DAYS Performed at [facility], [address] [place], [place] [address]    Report Status 08/17/2018 FINAL  Final  Culture, body fluid-bottle     Status: None   Collection Time: 08/12/18  1:59 PM   Specimen: Peritoneal Washings  Result Value Ref Range Status   Specimen Description PERITONEAL  Final   Special Requests FLUID  Final   Culture   Final    NO GROWTH 5 DAYS Performed at [facility], [address] [place], [place] [address]    Report Status 08/17/2018 FINAL  Final  Gram stain     Status: None   Collection Time: 08/12/18  1:59 PM   Specimen: Peritoneal Washings  Result Value Ref Range Status   Specimen Description PERITONEAL  Final   Special Requests FLUID  Final   Gram Stain   Final    CYTOSPIN SMEAR WBC PRESENT, PREDOMINANTLY MONONUCLEAR NO ORGANISMS SEEN Performed at [facility], [address] [place], [place] [address]    Report Status 08/12/2018 FINAL  Final  Novel Coronavirus,NAA,(SEND-OUT TO REF LAB - TAT 24-48 hrs); Hosp Order     Status: None   Collection Time: 08/17/18 12:01 AM   Specimen: Nasopharyngeal Swab; Respiratory  Result Value Ref Range Status   SARS-CoV-2, NAA NOT DETECTED NOT DETECTED Final    Comment: (NOTE) This test was developed and its performance characteristics determined by [facility]. This test has not been FDA cleared or  approved. This test has been authorized by FDA under an Emergency Use Authorization (EUA). This test is only authorized for the duration of time the declaration that circumstances exist justifying the authorization of the emergency use of in vitro diagnostic tests for detection of SARS-CoV-2 virus and/or diagnosis of COVID-19 infection under section 564(b)(1) of the Act, 21 U.S.C. [id number]), unless the authorization is terminated or revoked sooner. When diagnostic testing is negative, the possibility of a false negative result should be considered in the context of a patient's recent exposures and the presence of clinical signs and symptoms consistent with COVID-19. An individual without symptoms of COVID-19 and who is not shedding SARS-CoV-2 virus would expect to have a negative (not detected) result in this assay. Performed  At: [facility] [address] [place], [place] [id number] Jolene SchimkeNagendra Sanjai MD IO:[id number]:[phone]    Coronavirus Source NASOPHARYNGEAL  Final    Comment: Performed at [facility], [address] [place], [place] [address]     Labs: Basic Metabolic Panel: Recent Labs  Lab 08/14/18 1046 08/15/18 0446 08/16/18 0432  NA 132* 132* 136  K 3.7 3.2* 3.4*  CL 92* 89* 92*  CO2 33* 35* 38*  GLUCOSE 117* 126* 90  BUN 7 8 6   CREATININE 0.83 0.80 0.75  CALCIUM 7.9* 8.5* 8.4*  MG 1.7 1.8 1.7   CBC: Recent Labs  Lab 08/16/18 0432 08/17/18 0526 08/18/18 0604  WBC 2.9* 3.1* 3.5*  HGB 7.1* 6.9* 8.4*  HCT 23.2* 23.4* 27.3*  MCV 84.1 85.1 86.1  PLT 67* 66* 78*    Recent Labs    04/24/18 0439 05/30/18 0903 06/30/18 1729  BNP 461.5* 60.2 81.9    Principal Problem:   Alcoholic cirrhosis of liver with ascites (HCC) Active Problems:   Goals of care, counseling/discussion   Palliative care by specialist   Anemia   Time coordinating discharge: 35 minutes  Signed:  Brendia Sacksaniel Alontae Chaloux, MD  [facility]  08/20/2018, 10:53 AM

## 2018-08-20 NOTE — TOC Transition Note (Signed)
Transition of Care [facility]) - CM/SW Discharge Note   Patient Details  Name: GREIDYS DELAND MRN: [id number] Date of Birth: Sep 20, 1960  [facility] CM/SW Contact:  Bary Castilla, LCSW Phone Number: 08/20/2018, 11:47 AM   Clinical Narrative:    Patient will DC to: [facility]? Anticipated DC date:?08/20/18 Family notified:?Daughter- Garnet Koyanagi Transport by: Corey Harold   Per MD patient ready for DC to [facility].RN, patient, patient's family, and facility notified of DC. Discharge Summary sent to facility. RN given number for report [phone] room# 103 . DC packet on chart. Ambulance transport requested for patient.   CSW signing off.   [place], [place] [phone]     Final next level of care: Skilled Nursing Facility Barriers to Discharge: No Barriers Identified   Patient Goals and CMS Choice Patient states their goals for this hospitalization and ongoing recovery are:: to get more independent CMS Medicare.gov Compare Post Acute Care list provided to:: Patient Choice offered to / list presented to : Patient  Discharge Placement              Patient chooses bed at: [facility] Patient to be transferred to facility by: [place] Name of family member notified: Daughter- Garnet Koyanagi Patient and family notified of of transfer: 08/20/18  Discharge Plan and Services   Discharge Planning Services: CM Consult                                 Social Determinants of Health ([place]) Interventions     Readmission Risk Interventions No flowsheet data found.

## 2018-08-20 NOTE — Progress Notes (Signed)
Called  RN Sharleene for report to [facility]. All questions answered. Patient ready, awaiting on PTAR

## 2018-08-20 NOTE — Progress Notes (Signed)
Patient discharged: to SNF  Via: PTAR  Discharge paperwork given: to [facility] staff  Reviewed with teach back  IV disconnected  Belongings given to patient

## 2018-08-21 ENCOUNTER — Encounter (HOSPITAL_COMMUNITY): Payer: Self-pay

## 2018-08-21 ENCOUNTER — Emergency Department (HOSPITAL_COMMUNITY)
Admission: EM | Admit: 2018-08-21 | Discharge: 2018-08-21 | Disposition: A | Discharge status: Home / Self Care | Payer: Self-pay | Attending: Emergency Medicine | Admitting: Emergency Medicine

## 2018-08-21 ENCOUNTER — Other Ambulatory Visit: Payer: Self-pay

## 2018-08-21 DIAGNOSIS — G8929 Other chronic pain: Secondary | ICD-10-CM | POA: Insufficient documentation

## 2018-08-21 DIAGNOSIS — J449 Chronic obstructive pulmonary disease, unspecified: Secondary | ICD-10-CM | POA: Insufficient documentation

## 2018-08-21 DIAGNOSIS — R1084 Generalized abdominal pain: Secondary | ICD-10-CM | POA: Insufficient documentation

## 2018-08-21 DIAGNOSIS — F17210 Nicotine dependence, cigarettes, uncomplicated: Secondary | ICD-10-CM | POA: Insufficient documentation

## 2018-08-21 DIAGNOSIS — Z79899 Other long term (current) drug therapy: Secondary | ICD-10-CM | POA: Insufficient documentation

## 2018-08-21 MED ORDER — PANTOPRAZOLE SODIUM 20 MG PO TBEC
20.0000 mg | DELAYED_RELEASE_TABLET | Freq: Once | ORAL | Status: AC
  Administered 2018-08-21: 20 mg via ORAL
  Filled 2018-08-21: qty 1

## 2018-08-21 MED ORDER — LIDOCAINE VISCOUS HCL 2 % MT SOLN
15.0000 mL | Freq: Once | OROMUCOSAL | Status: AC
  Administered 2018-08-21: 15 mL via ORAL
  Filled 2018-08-21: qty 15

## 2018-08-21 MED ORDER — ALUM & MAG HYDROXIDE-SIMETH 200-200-20 MG/5ML PO SUSP
30.0000 mL | Freq: Once | ORAL | Status: AC
  Administered 2018-08-21: 30 mL via ORAL
  Filled 2018-08-21: qty 30

## 2018-08-21 NOTE — ED Triage Notes (Signed)
Patient coming from [facility] with complaints of rib pain that started when she was discharged from the hospital yesterday. The SNF provided the patient with her cell phone and she called [phone] because of her pain. EMS administered 324 mg of aspirin. EKG unremarkable.

## 2018-08-21 NOTE — Discharge Instructions (Addendum)
It is important that you establish care with a primary care provider. You can call the number on the paperwork to help you set this up.  Take your home medications as prescribed.  Return to the ER with any new or concerning symptoms.

## 2018-08-21 NOTE — ED Notes (Addendum)
PTAR called for transport. Report called to [place].

## 2018-08-21 NOTE — ED Provider Notes (Signed)
[facility]-EMERGENCY DEPT Provider Note   CSN: [id number] Arrival date & time: 08/21/18  0524     History   Chief Complaint Chief Complaint  Patient presents with  . Chest Pain    HPI Anne Black is a 58 y.o. female presenting for evaluation of bilateral abd pain.   Pt states she is having pain of bilateral abd. It begins just under her breasts and radiates to her waist. Pain is constant, worse with movement over the torso and arms. No increased pain with respirations. Pain was not present upon d/c from the hospital yesterday, but pt cannot tell me exactly when it began. Pt states this feels similar to her chronic pain. She has not taken anything for it. She has not had her normal morning medications. She did not consider talking to the provider at the facility where she stays. She denies CP, sob, fever, cough. She has chronic nausea/vomiting. Pt states she has 2 paracentesis in the hospital, which helped with her chronic abd pain.   additional history obtained from chart review. Pt seen 54 times in the past 54 months, often for abd pain. H/o alcoholic cirrhosis, anxiety, PAF not on anticoagulation, bipolar, copd on o2, gerd, hiatal hernia, ms.      HPI  Past Medical History:  Diagnosis Date  . ADHD (attention deficit hyperactivity disorder)   . Alcoholic cirrhosis (HCC)    with ascites  . Alcoholism (HCC)   . Allergies   . Anxiety   . Arthritis   . Atrial fibrillation with RVR (HCC)   . Bipolar disorder (HCC)   . COPD (chronic obstructive pulmonary disease) (HCC)    wears 2L chronic O2  . Dyspnea   . EtOH dependence (HCC) 05/14/2018  . GERD (gastroesophageal reflux disease)   . History of hiatal hernia   . Hypokalemia   . Migraine   . Multiple sclerosis (HCC)   . Narcolepsy   . Osteoarthritis of knee   . Osteoporosis   . Pneumonia   . UTI (urinary tract infection) 08/11/2018    Patient Active Problem List   Diagnosis Date Noted  . Anemia  08/16/2018  . Palliative care by specialist   . Cellulitis of lower extremity   . Peripheral edema   . Atrial fibrillation with RVR (HCC) 06/13/2018  . Alcohol dependence (HCC) 05/06/2018  . Electrolyte disturbance 05/05/2018  . Alcohol withdrawal delirium (HCC) 04/28/2018  . Weakness generalized 04/27/2018  . Gait abnormality 04/24/2018  . Chronic hypoxemic respiratory failure (HCC) 04/11/2018  . Atrial fibrillation with rapid ventricular response (HCC) 03/23/2018  . Head injury   . Fall 03/19/2018  . Syncope 03/19/2018  . Orbital fracture 03/19/2018  . Scalp laceration 03/19/2018  . Acute respiratory failure with hypoxia and hypercarbia (HCC) 03/19/2018  . Abdominal tenderness 03/19/2018  . Acute on chronic respiratory failure with hypoxia (HCC) 03/17/2018  . Alcohol abuse with intoxication (HCC) 03/17/2018  . ETOH abuse   . Normocytic anemia 03/16/2018  . Acute metabolic encephalopathy 03/16/2018  . Lower extremity edema 03/16/2018  . Migraine 03/16/2018  . Community acquired pneumonia of left lower lobe of lung (HCC)   . Weakness 08/21/2017  . COPD exacerbation (HCC) 06/12/2017  . Chest pain with moderate risk for cardiac etiology 05/30/2017  . Abnormal LFTs 05/30/2017  . Tachycardia 05/30/2017  . COPD (chronic obstructive pulmonary disease) (HCC) 04/19/2017  . Right rib fracture 04/15/2017  . Abdominal wall cellulitis 12/30/2016  . S/P exploratory laparotomy 12/30/2016  .  Distal radius fracture, left 09/09/2016  . Thrombocytopenia ([place]) 09/09/2016  . Chest x-ray abnormality   . Umbilical hernia without obstruction and without gangrene 05/12/2016  . Itching 02/24/2016  . Ventral hernia without obstruction or gangrene 02/24/2016  . Goals of care, counseling/discussion   . Ascites   . Tachypnea   . Atrial flutter with rapid ventricular response ([place])   . Hypomagnesemia   . Hypoxia   . Dehydration 01/22/2016  . Low back ache 12/30/2015  . Non-intractable vomiting  with nausea   . Alcohol abuse with alcohol-induced mood disorder ([place]) 12/15/2015  . Acute alcoholism ([place])   . Distended abdomen 11/04/2015  . Alcohol abuse   . Dyspnea   . Acute respiratory failure ([place]) 10/15/2015  . Ascites due to alcoholic cirrhosis ([place]) 56/38/7564  . Osteoporosis   . Multiple sclerosis ([place])   . Alcoholic cirrhosis of liver with ascites ([place]) 08/22/2015  . Bipolar disorder, current episode mixed, moderate ([place]) 08/01/2015  . Alcohol use disorder, severe, dependence ([place]) 07/31/2015  . Macrocytic anemia- due to alcohol abuse with normal B12 & folate levels 05/31/2015  . Severe protein-calorie malnutrition ([place]) 05/31/2015  . Hypokalemia 05/26/2015  . Alcohol withdrawal ([place]) 05/18/2015  . Abdominal pain 05/18/2015  . Anxiety 05/18/2015  . Stimulant abuse ([place]) 12/27/2014  . Nicotine abuse 12/27/2014  . Hyperprolactinemia ([place]) 11/15/2014  . Dyslipidemia   . Alcohol use disorder, moderate, dependence ([place]) 10/30/2014  . Tobacco abuse 01/23/2014  . Bipolar 1 disorder ([place]) 04/07/2013  . Noncompliance with therapeutic plan 04/04/2013  . Hereditary and idiopathic peripheral neuropathy 05/01/2012  . Depression 05/01/2012  . GERD (gastroesophageal reflux disease) 05/01/2012  . Osteoarthrosis, unspecified whether generalized or localized, involving lower leg 05/01/2012  . Hyponatremia 07/01/2011    Past Surgical History:  Procedure Laterality Date  . ABDOMINAL WALL DEFECT REPAIR N/A 12/30/2016   Procedure: EXPLORATORY LAPAROTOMY WITH REPAIR ABDOMINAL WALL VENTRAL HERNIA;  Surgeon: Rolm Bookbinder, MD;  Location: [facility];  Service: General;  Laterality: N/A;  . APPLICATION OF WOUND VAC N/A 12/30/2016   Procedure: APPLICATION OF WOUND VAC;  Surgeon: Rolm Bookbinder, MD;  Location: [facility];  Service: General;  Laterality: N/A;  . CESAREAN SECTION  [phone]  . FRACTURE SURGERY    . HERNIA REPAIR    . IR PARACENTESIS  06/14/2018  . IR PARACENTESIS  08/14/2018  .  IR PARACENTESIS  08/18/2018  . MYRINGOTOMY WITH TUBE PLACEMENT Bilateral   . ORIF WRIST FRACTURE Left 09/09/2016   Procedure: OPEN REDUCTION INTERNAL FIXATION (ORIF) LEFT WRIST FRACTURE, LEFT CARPAL TUNNEL RELEASE;  Surgeon: Roseanne Kaufman, MD;  Location: [facility];  Service: Orthopedics;  Laterality: Left;  . TONSILLECTOMY       OB History   No obstetric history on file.      Home Medications    Prior to Admission medications   Medication Sig Start Date End Date Taking? Authorizing Provider  folic acid (FOLVITE) 1 MG tablet Take 1 tablet (1 mg total) by mouth daily. 08/21/18   Samuella Cota, MD  furosemide (LASIX) 40 MG tablet Take 1 tablet (40 mg total) by mouth daily. 06/27/18   Maudie Flakes, MD  gabapentin (NEURONTIN) 100 MG capsule Take 2 capsules (200 mg total) by mouth 3 (three) times daily for 30 days. 07/19/18 08/18/18  Domenic Moras, PA-C  hydrOXYzine (ATARAX/VISTARIL) 25 MG tablet Take 1 tablet (25 mg total) by mouth daily as needed for up to 30 doses (anxiety). 04/14/18   Tegeler, Gwenyth Allegra, MD  lactulose (CHRONULAC) 10 GM/15ML solution Take 30 mLs (20 g total) by mouth 2 (two) times daily. 08/20/18   Standley Brooking, MD  LORazepam (ATIVAN) 0.5 MG tablet Take 1 tablet (0.5 mg total) by mouth every 12 (twelve) hours as needed for anxiety. 08/20/18   Standley Brooking, MD  Multiple Vitamin (MULTIVITAMIN WITH MINERALS) TABS tablet Take 1 tablet by mouth daily. 08/21/18   Standley Brooking, MD  ondansetron (ZOFRAN) 4 MG tablet Take 1 tablet (4 mg total) by mouth every 6 (six) hours. 08/05/18   Lawyer, Cristal Deer, PA-C  pantoprazole (PROTONIX) 20 MG tablet Take 2 tablets (40 mg total) by mouth daily. 07/29/18   Mesner, Barbara Cower, MD  potassium chloride SA (K-DUR,KLOR-CON) 20 MEQ tablet Take 2 tablets (40 mEq total) by mouth 2 (two) times daily. 04/19/18   Bethel Born, PA-C  spironolactone (ALDACTONE) 25 MG tablet Take 1 tablet (25 mg total) by mouth daily for 30 days. 06/27/18  08/10/18  Sabas Sous, MD  thiamine 100 MG tablet Take 1 tablet (100 mg total) by mouth daily. 08/21/18   Standley Brooking, MD    Family History Family History  Problem Relation Age of Onset  . Arrhythmia Mother   . Neuropathy Mother   . Coronary artery disease Mother   . Heart disease Mother   . Heart disease Father   . Hypertension Father   . Heart attack Father   . Multiple sclerosis Maternal Aunt     Social History Social History   Tobacco Use  . Smoking status: Current Every Day Smoker    Packs/day: 1.00    Years: 36.00    Pack years: 36.00    Types: Cigarettes  . Smokeless tobacco: Never Used  Substance Use Topics  . Alcohol use: Yes    Comment: Chronic ETOH abuse  . Drug use: No     Allergies   Patient has no known allergies.   Review of Systems Review of Systems  Gastrointestinal: Positive for abdominal pain and nausea.  All other systems reviewed and are negative.    Physical Exam Updated Vital Signs BP (!) 97/50 (BP Location: Right Arm)   Pulse 81   Temp 98.8 F (37.1 C) (Oral)   Resp 18   Ht 5\' 2"  (1.575 m)   Wt 75 kg   SpO2 100%   BMI 30.24 kg/m   Physical Exam Vitals signs and nursing note reviewed.  Constitutional:      General: She is not in acute distress.    Appearance: She is well-developed.     Comments: Appears nontoxic  HENT:     Head: Normocephalic and atraumatic.  Eyes:     Extraocular Movements: Extraocular movements intact.     Conjunctiva/sclera: Conjunctivae normal.     Pupils: Pupils are equal, round, and reactive to light.  Neck:     Musculoskeletal: Normal range of motion and neck supple.  Cardiovascular:     Rate and Rhythm: Normal rate and regular rhythm.     Pulses: Normal pulses.  Pulmonary:     Effort: Pulmonary effort is normal. No respiratory distress.     Breath sounds: Normal breath sounds. No wheezing.  Abdominal:     Palpations: Abdomen is soft. There is no mass.     Tenderness: There is no  abdominal tenderness. There is no guarding or rebound.     Comments: Generalized ttp of the abd. No rigidity or guarding. Mild distention, chronic 2/2 ascites. Negative rebound. No  signs of peritonitis.  Musculoskeletal: Normal range of motion.  Skin:    General: Skin is warm and dry.     Capillary Refill: Capillary refill takes less than 2 seconds.  Neurological:     Mental Status: She is alert and oriented to person, place, and time.      ED Treatments / Results  Labs (all labs ordered are listed, but only abnormal results are displayed) Labs Reviewed - No data to display  EKG None  [facility]  Result Date: 08/19/2018 CLINICAL DATA:  Shortness of breath. EXAM: PORTABLE CHEST 1 VIEW COMPARISON:  August 10, 2018 FINDINGS: Cardiomediastinal silhouette is normal. Mediastinal contours appear intact. There is no evidence of focal airspace consolidation, pleural effusion or pneumothorax. Osseous structures are without acute abnormality. Soft tissues are grossly normal. IMPRESSION: No active disease. Electronically Signed   By: Ted Mcalpineobrinka  Dimitrova M.D.   On: 08/19/2018 15:30    Procedures Procedures (including critical care time)  Medications Ordered in ED Medications  pantoprazole (PROTONIX) EC tablet 20 mg (has no administration in time range)  alum & mag hydroxide-simeth (MAALOX/MYLANTA) 200-200-20 MG/5ML suspension 30 mL (has no administration in time range)    And  lidocaine (XYLOCAINE) 2 % viscous mouth solution 15 mL (has no administration in time range)     Initial Impression / Assessment and Plan / ED Course  I have reviewed the triage vital signs and the nursing notes.  Pertinent labs & imaging results that were available during my care of the patient were reviewed by me and considered in my medical decision making (see chart for details).        Pt presenting for evaluation of abd pain. Pain is chronic/frequent. On exam, she appears nontoxic. bp  slightly low, not far from baseline. Low suspicion for infection, perf, mesenteric ischemia, obstruction. Pt requesting protonix and maalox. Also requesting ginger ale. I have low suspicion/concern for acute emergency. discussed with pt that for her chronic/reccurent pain, she should follow up with the provider at the facility, as she has recently been placed. discussed possibility of pcp arranging OP paracentesis. Case discussed with attending, Dr. Clayborne DanaMesner agrees to plan. On reassessment, pt tolerating fluids without difficulty. Pt stating she wanted to go to a different SNF, despite multiple notes during which she states she wanted to go to [facility]. I discussed with pt that we cannot send her to a different facility from the ED. She will need to work with the Child psychotherapistsocial worker at her facility if she wants to switch. At this time, pt appears safe for d/c. Return precautions given. Pt states she understands and agrees to plan.      Final Clinical Impressions(s) / ED Diagnoses   Final diagnoses:  Chronic abdominal pain    ED Discharge Orders    None       Alveria ApleyCaccavale, Sharnee Douglass, PA-C 08/21/18 0656    Mesner, Barbara CowerJason, MD 08/22/18 [phone]

## 2018-08-22 ENCOUNTER — Emergency Department (HOSPITAL_COMMUNITY)
Admission: EM | Admit: 2018-08-22 | Discharge: 2018-08-22 | Disposition: A | Discharge status: Home / Self Care | Payer: Self-pay | Attending: Emergency Medicine | Admitting: Emergency Medicine

## 2018-08-22 DIAGNOSIS — R109 Unspecified abdominal pain: Secondary | ICD-10-CM | POA: Insufficient documentation

## 2018-08-22 DIAGNOSIS — J449 Chronic obstructive pulmonary disease, unspecified: Secondary | ICD-10-CM | POA: Insufficient documentation

## 2018-08-22 DIAGNOSIS — R110 Nausea: Secondary | ICD-10-CM | POA: Insufficient documentation

## 2018-08-22 DIAGNOSIS — F17210 Nicotine dependence, cigarettes, uncomplicated: Secondary | ICD-10-CM | POA: Insufficient documentation

## 2018-08-22 DIAGNOSIS — Z79899 Other long term (current) drug therapy: Secondary | ICD-10-CM | POA: Insufficient documentation

## 2018-08-22 MED ORDER — ONDANSETRON HCL 4 MG PO TABS: 4.0000 mg | ORAL_TABLET | Freq: Four times a day (QID) | ORAL | 0 refills | Status: DC

## 2018-08-22 MED ORDER — ONDANSETRON 8 MG PO TBDP
8.0000 mg | ORAL_TABLET | Freq: Once | ORAL | Status: AC
  Administered 2018-08-22: 8 mg via ORAL
  Filled 2018-08-22: qty 1

## 2018-08-22 NOTE — Discharge Instructions (Signed)
You were seen in the emergency department for nausea and being out of your nausea medication.  We are providing you with a prescription for Zofran.  You ate something here in the department.  Please contact your primary care doctor for follow-up.

## 2018-08-22 NOTE — ED Provider Notes (Signed)
[facility]-EMERGENCY DEPT Provider Note   CSN: [id number] Arrival date & time: 08/22/18  1011     History   Chief Complaint Chief Complaint  Patient presents with  . Abdominal Pain    HPI Anne Black is a 58 y.o. female.  Patient is a frequent visitor to the emergency department and has a care plan in place.  She has chronic abdominal symptoms and is here complaining of some nausea and not having eaten today.  She denies any pain or any fever.  She was here yesterday for same and says she left [facility] because of a disagreement with them and now is at home.  She is asking for social work.  She said she is out of her Zofran.     The history is provided by the patient.  Illness Quality:  Nausea Severity:  Moderate Onset quality:  Gradual Timing:  Intermittent Progression:  Unchanged Chronicity:  Chronic Relieved by:  Nothing tried Worsened by:  Nothing Ineffective treatments:  Nothing Associated symptoms: nausea   Associated symptoms: no abdominal pain, no chest pain, no cough, no fever, no headaches, no shortness of breath and no sore throat   Risk factors:  Alcohol abuse   Past Medical History:  Diagnosis Date  . ADHD (attention deficit hyperactivity disorder)   . Alcoholic cirrhosis (HCC)    with ascites  . Alcoholism (HCC)   . Allergies   . Anxiety   . Arthritis   . Atrial fibrillation with RVR (HCC)   . Bipolar disorder (HCC)   . COPD (chronic obstructive pulmonary disease) (HCC)    wears 2L chronic O2  . Dyspnea   . EtOH dependence (HCC) 05/14/2018  . GERD (gastroesophageal reflux disease)   . History of hiatal hernia   . Hypokalemia   . Migraine   . Multiple sclerosis (HCC)   . Narcolepsy   . Osteoarthritis of knee   . Osteoporosis   . Pneumonia   . UTI (urinary tract infection) 08/11/2018    Patient Active Problem List   Diagnosis Date Noted  . Anemia 08/16/2018  . Palliative care by specialist   . Cellulitis of  lower extremity   . Peripheral edema   . Atrial fibrillation with RVR (HCC) 06/13/2018  . Alcohol dependence (HCC) 05/06/2018  . Electrolyte disturbance 05/05/2018  . Alcohol withdrawal delirium (HCC) 04/28/2018  . Weakness generalized 04/27/2018  . Gait abnormality 04/24/2018  . Chronic hypoxemic respiratory failure (HCC) 04/11/2018  . Atrial fibrillation with rapid ventricular response (HCC) 03/23/2018  . Head injury   . Fall 03/19/2018  . Syncope 03/19/2018  . Orbital fracture 03/19/2018  . Scalp laceration 03/19/2018  . Acute respiratory failure with hypoxia and hypercarbia (HCC) 03/19/2018  . Abdominal tenderness 03/19/2018  . Acute on chronic respiratory failure with hypoxia (HCC) 03/17/2018  . Alcohol abuse with intoxication (HCC) 03/17/2018  . ETOH abuse   . Normocytic anemia 03/16/2018  . Acute metabolic encephalopathy 03/16/2018  . Lower extremity edema 03/16/2018  . Migraine 03/16/2018  . Community acquired pneumonia of left lower lobe of lung (HCC)   . Weakness 08/21/2017  . COPD exacerbation (HCC) 06/12/2017  . Chest pain with moderate risk for cardiac etiology 05/30/2017  . Abnormal LFTs 05/30/2017  . Tachycardia 05/30/2017  . COPD (chronic obstructive pulmonary disease) (HCC) 04/19/2017  . Right rib fracture 04/15/2017  . Abdominal wall cellulitis 12/30/2016  . S/P exploratory laparotomy 12/30/2016  . Distal radius fracture, left 09/09/2016  . Thrombocytopenia (  [place]) 09/09/2016  . Chest x-ray abnormality   . Umbilical hernia without obstruction and without gangrene 05/12/2016  . Itching 02/24/2016  . Ventral hernia without obstruction or gangrene 02/24/2016  . Goals of care, counseling/discussion   . Ascites   . Tachypnea   . Atrial flutter with rapid ventricular response ([place])   . Hypomagnesemia   . Hypoxia   . Dehydration 01/22/2016  . Low back ache 12/30/2015  . Non-intractable vomiting with nausea   . Alcohol abuse with alcohol-induced mood disorder  ([place]) 12/15/2015  . Acute alcoholism ([place])   . Distended abdomen 11/04/2015  . Alcohol abuse   . Dyspnea   . Acute respiratory failure ([place]) 10/15/2015  . Ascites due to alcoholic cirrhosis ([place]) 97/35/3299  . Osteoporosis   . Multiple sclerosis ([place])   . Alcoholic cirrhosis of liver with ascites ([place]) 08/22/2015  . Bipolar disorder, current episode mixed, moderate ([place]) 08/01/2015  . Alcohol use disorder, severe, dependence ([place]) 07/31/2015  . Macrocytic anemia- due to alcohol abuse with normal B12 & folate levels 05/31/2015  . Severe protein-calorie malnutrition ([place]) 05/31/2015  . Hypokalemia 05/26/2015  . Alcohol withdrawal ([place]) 05/18/2015  . Abdominal pain 05/18/2015  . Anxiety 05/18/2015  . Stimulant abuse ([place]) 12/27/2014  . Nicotine abuse 12/27/2014  . Hyperprolactinemia ([place]) 11/15/2014  . Dyslipidemia   . Alcohol use disorder, moderate, dependence ([place]) 10/30/2014  . Tobacco abuse 01/23/2014  . Bipolar 1 disorder ([place]) 04/07/2013  . Noncompliance with therapeutic plan 04/04/2013  . Hereditary and idiopathic peripheral neuropathy 05/01/2012  . Depression 05/01/2012  . GERD (gastroesophageal reflux disease) 05/01/2012  . Osteoarthrosis, unspecified whether generalized or localized, involving lower leg 05/01/2012  . Hyponatremia 07/01/2011    Past Surgical History:  Procedure Laterality Date  . ABDOMINAL WALL DEFECT REPAIR N/A 12/30/2016   Procedure: EXPLORATORY LAPAROTOMY WITH REPAIR ABDOMINAL WALL VENTRAL HERNIA;  Surgeon: Rolm Bookbinder, MD;  Location: [facility];  Service: General;  Laterality: N/A;  . APPLICATION OF WOUND VAC N/A 12/30/2016   Procedure: APPLICATION OF WOUND VAC;  Surgeon: Rolm Bookbinder, MD;  Location: [facility];  Service: General;  Laterality: N/A;  . CESAREAN SECTION  [phone]  . FRACTURE SURGERY    . HERNIA REPAIR    . IR PARACENTESIS  06/14/2018  . IR PARACENTESIS  08/14/2018  . IR PARACENTESIS  08/18/2018  . MYRINGOTOMY WITH TUBE PLACEMENT  Bilateral   . ORIF WRIST FRACTURE Left 09/09/2016   Procedure: OPEN REDUCTION INTERNAL FIXATION (ORIF) LEFT WRIST FRACTURE, LEFT CARPAL TUNNEL RELEASE;  Surgeon: Roseanne Kaufman, MD;  Location: [facility];  Service: Orthopedics;  Laterality: Left;  . TONSILLECTOMY       OB History   No obstetric history on file.      Home Medications    Prior to Admission medications   Medication Sig Start Date End Date Taking? Authorizing Provider  folic acid (FOLVITE) 1 MG tablet Take 1 tablet (1 mg total) by mouth daily. 08/21/18   Samuella Cota, MD  furosemide (LASIX) 40 MG tablet Take 1 tablet (40 mg total) by mouth daily. 06/27/18   Maudie Flakes, MD  gabapentin (NEURONTIN) 100 MG capsule Take 2 capsules (200 mg total) by mouth 3 (three) times daily for 30 days. 07/19/18 08/18/18  Domenic Moras, PA-C  hydrOXYzine (ATARAX/VISTARIL) 25 MG tablet Take 1 tablet (25 mg total) by mouth daily as needed for up to 30 doses (anxiety). 04/14/18   Tegeler, Gwenyth Allegra, MD  lactulose (CHRONULAC) 10 GM/15ML solution Take 30  mLs (20 g total) by mouth 2 (two) times daily. 08/20/18   Standley BrookingGoodrich, Daniel P, MD  LORazepam (ATIVAN) 0.5 MG tablet Take 1 tablet (0.5 mg total) by mouth every 12 (twelve) hours as needed for anxiety. 08/20/18   Standley BrookingGoodrich, Daniel P, MD  Multiple Vitamin (MULTIVITAMIN WITH MINERALS) TABS tablet Take 1 tablet by mouth daily. 08/21/18   Standley BrookingGoodrich, Daniel P, MD  ondansetron (ZOFRAN) 4 MG tablet Take 1 tablet (4 mg total) by mouth every 6 (six) hours. 08/05/18   Lawyer, Cristal Deerhristopher, PA-C  pantoprazole (PROTONIX) 20 MG tablet Take 2 tablets (40 mg total) by mouth daily. 07/29/18   Mesner, Barbara CowerJason, MD  potassium chloride SA (K-DUR,KLOR-CON) 20 MEQ tablet Take 2 tablets (40 mEq total) by mouth 2 (two) times daily. 04/19/18   Bethel BornGekas, Kelly Marie, PA-C  spironolactone (ALDACTONE) 25 MG tablet Take 1 tablet (25 mg total) by mouth daily for 30 days. 06/27/18 08/10/18  Sabas SousBero,  M, MD  thiamine 100 MG tablet Take 1 tablet  (100 mg total) by mouth daily. 08/21/18   Standley BrookingGoodrich, Daniel P, MD    Family History Family History  Problem Relation Age of Onset  . Arrhythmia Mother   . Neuropathy Mother   . Coronary artery disease Mother   . Heart disease Mother   . Heart disease Father   . Hypertension Father   . Heart attack Father   . Multiple sclerosis Maternal Aunt     Social History Social History   Tobacco Use  . Smoking status: Current Every Day Smoker    Packs/day: 1.00    Years: 36.00    Pack years: 36.00    Types: Cigarettes  . Smokeless tobacco: Never Used  Substance Use Topics  . Alcohol use: Yes    Comment: Chronic ETOH abuse  . Drug use: No     Allergies   Patient has no known allergies.   Review of Systems Review of Systems  Constitutional: Negative for fever.  HENT: Negative for sore throat.   Eyes: Negative for visual disturbance.  Respiratory: Negative for cough and shortness of breath.   Cardiovascular: Negative for chest pain.  Gastrointestinal: Positive for nausea. Negative for abdominal pain.  Genitourinary: Negative for dysuria.  Musculoskeletal: Negative for neck pain.  Skin: Negative for wound.  Neurological: Negative for headaches.     Physical Exam Updated Vital Signs BP (!) 106/57 (BP Location: Right Arm)   Pulse 75   Temp 98.1 F (36.7 C) (Oral)   Resp 16   Ht 5\' 2"  (1.575 m)   Wt 81.6 kg   SpO2 100%   BMI 32.92 kg/m   Physical Exam Vitals signs and nursing note reviewed.  Constitutional:      General: She is not in acute distress.    Appearance: She is well-developed.  HENT:     Head: Normocephalic and atraumatic.  Eyes:     Conjunctiva/sclera: Conjunctivae normal.  Neck:     Musculoskeletal: Neck supple.  Cardiovascular:     Rate and Rhythm: Normal rate and regular rhythm.     Heart sounds: No murmur.  Pulmonary:     Effort: Pulmonary effort is normal. No respiratory distress.     Breath sounds: Normal breath sounds.  Abdominal:      Palpations: Abdomen is soft.     Tenderness: There is no abdominal tenderness. There is no guarding or rebound.  Musculoskeletal: Normal range of motion.  Skin:    General: Skin is warm and dry.  Capillary Refill: Capillary refill takes less than 2 seconds.  Neurological:     General: No focal deficit present.     Mental Status: She is alert. Mental status is at baseline.      ED Treatments / Results  Labs (all labs ordered are listed, but only abnormal results are displayed) Labs Reviewed - No data to display  EKG None  Radiology No results found.  Procedures Procedures (including critical care time)  Medications Ordered in ED Medications  ondansetron (ZOFRAN-ODT) disintegrating tablet 8 mg (has no administration in time range)     Initial Impression / Assessment and Plan / ED Course  I have reviewed the triage vital signs and the nursing notes.  Pertinent labs & imaging results that were available during my care of the patient were reviewed by me and considered in my medical decision making (see chart for details).  Clinical Course as of Aug 21 1648  Tue Aug 22, 2018  [id number] 58 year old female with chronic abdominal pain complaining of nausea in the setting of having just left her care facility.  She is asking for something to eat and talk to social work.   [MB]  1204 Social worker evaluated the patient.  They gave [facility] the [facility] versus returning home.  She is reaching out to family   [MB]  1227 Patient reportedly ate a sandwich here without any difficulty.  The nurse that the patient's been calling out on her phone to a secretary station demanding to be discharged.   [MB]    Clinical Course User Index [MB] Terrilee FilesButler,  C, MD        Final Clinical Impressions(s) / ED Diagnoses   Final diagnoses:  Nausea    ED Discharge Orders         Ordered    ondansetron (ZOFRAN) 4 MG tablet  Every 6 hours      08/22/18 1231           Terrilee FilesButler,  C, MD 08/22/18 1651

## 2018-08-22 NOTE — ED Notes (Signed)
Informed by unit secretary that pt is calling unit phone from her room and requesting to be discharged, this nurse notified EDP.

## 2018-08-22 NOTE — Progress Notes (Addendum)
12:30p CSW spoke with patient's daughter [name] who states she is in [place], [place] and unable to pick up patient. CSW then called patient's daughter Garnet Koyanagi who states she will be able to pick patient up now. During conversation with Garnet Koyanagi, [place] recommended again that patient get a PCP and they can assist with placement needs vs patient coming to the ED. CSW sent Garnet Koyanagi number to [facility].  11:55a CSW spoke with EDP who states CSW was consulted because patient left [facility]. Per EDP he does not see a reason for patient to be held her in the ED for a PT consult and placement at this time. Patient to return home. CSW informed patient that we will be sending her back home. Patient asked CSW several times about being place at other facilities one of them being [facility]. CSW explained to patient that she can work on placement on her own and will have [facility] give her a call if they will accept her back. Patient stated that she does not want to go back to [facility]. Patient was agreeable to CSW calling patient's daughter for pick up.    11:50a CSW spoke with patient via bedside. Patient explained she left [facility] because the staff there was "mean and rude to me" and started telling a story about one of the staff members not giving her something she wanted. Patient was placed at [facility] after an inpatient stay a [facility] from the July 2nd to July 12th. CSW explained to patient that her options are to return to [facility] (if they will take patient back) or return home once medically cleared. Patient then asked if she can be placed at [facility]. CSW explained that we will contact [facility] and speak with the EDP before making a decision to place her somewhere else.   CSW spoke with Juliann Pulse with [facility] to see if they will take her back. CSW awaiting a decision on this. CSW also called [place] Supervisor on how to proceed with this patient being that patient has an ED Care Plan and patient has a hx of not following through on disposition plans.   [place], LCSW Transitions of Care Department [facility] ED [phone]

## 2018-08-22 NOTE — ED Notes (Signed)
Patient called triage and told this RN that "my social worker left abruptly and I wanted to tell her that I wanted to go to [place] home"

## 2018-08-22 NOTE — ED Notes (Signed)
Social worker at bedside.

## 2018-08-22 NOTE — ED Notes (Signed)
Pt ate sandwich and drank diet coke. Pt tolerating PO intake well.

## 2018-08-22 NOTE — ED Triage Notes (Signed)
Pt to ED c/o abdominal pain. Pain started today, generalized. Reports nausea, no vomiting.  A&O x 4.

## 2018-08-22 NOTE — ED Notes (Signed)
Pt requesting to speak to Education officer, museum. Informs this nurse they have "been working on some things" when asked, pt not forthcoming with information. This nurse to notify EDP.

## 2018-08-22 NOTE — ED Notes (Signed)
Pt given [place] Sandwich and Diet coke per her request.

## 2018-08-22 NOTE — TOC Initial Note (Signed)
Transition of Care [facility]) - Initial/Assessment Note    Patient Details  Name: Anne Black MRN: [id number] Date of Birth: Dec 21, 1960  Transition of Care [facility]) CM/SW Contact:    Elliot Cousin, RN Phone Number: [phone] 08/22/2018, 7:47 PM  Clinical Narrative:    2 ED visits/11 admissions               Spoke to pt and states she does not drive to her appts. She usually calls a taxi or her dtr to take her to appts. She is agreeable to [facility] CM arrange appt at [facility] for follow up. [facility] will not accept pt back. She has been not consistent or complaint with her treatment plan. Please see previous TOC CM/SW notes.   Will arrange follow up appointment with [facility] and provide pt/dtrs with information.   Expected Discharge Plan: Home/Self Care Barriers to Discharge: No Barriers Identified   Patient Goals and CMS Choice Patient states their goals for this hospitalization and ongoing recovery are:: not sure what I need to do CMS Medicare.gov Compare Post Acute Care list provided to:: Patient    Expected Discharge Plan and Services Expected Discharge Plan: Home/Self Care   Discharge Planning Services: CM Consult   Living arrangements for the past 2 months: Single Family Home                                      Prior Living Arrangements/Services Living arrangements for the past 2 months: Single Family Home Lives with:: Self Patient language and need for interpreter reviewed:: Yes Do you feel safe going back to the place where you live?: Yes      Need for Family Participation in Patient Care: Yes (Comment) Care giver support system in place?: No (comment)   Criminal Activity/Legal Involvement Pertinent to Current Situation/Hospitalization: No - Comment as needed  Activities of Daily Living      Permission Sought/Granted Permission sought to share information with : Case Manager, PCP, Family Supports, Other (comment) Permission granted to share  information with : Yes, Verbal Permission Granted  Share Information with NAME: Sallye Ober  Permission granted to share info w AGENCY: [employer]  Permission granted to share info w Relationship: daughters  Permission granted to share info w Contact Information: [phone]  Emotional Assessment   Attitude/Demeanor/Rapport: Inconsistent   Orientation: : Oriented to Self, Oriented to Place, Oriented to Situation   Psych Involvement: No (comment)  Admission diagnosis:  Abdominal Pain Patient Active Problem List   Diagnosis Date Noted  . Anemia 08/16/2018  . Palliative care by specialist   . Cellulitis of lower extremity   . Peripheral edema   . Atrial fibrillation with RVR (HCC) 06/13/2018  . Alcohol dependence (HCC) 05/06/2018  . Electrolyte disturbance 05/05/2018  . Alcohol withdrawal delirium (HCC) 04/28/2018  . Weakness generalized 04/27/2018  . Gait abnormality 04/24/2018  . Chronic hypoxemic respiratory failure (HCC) 04/11/2018  . Atrial fibrillation with rapid ventricular response (HCC) 03/23/2018  . Head injury   . Fall 03/19/2018  . Syncope 03/19/2018  . Orbital fracture 03/19/2018  . Scalp laceration 03/19/2018  . Acute respiratory failure with hypoxia and hypercarbia (HCC) 03/19/2018  . Abdominal tenderness 03/19/2018  . Acute on chronic respiratory failure with hypoxia (HCC) 03/17/2018  . Alcohol abuse with intoxication (HCC) 03/17/2018  . ETOH abuse   . Normocytic anemia 03/16/2018  . Acute metabolic encephalopathy  03/16/2018  . Lower extremity edema 03/16/2018  . Migraine 03/16/2018  . Community acquired pneumonia of left lower lobe of lung ([place])   . Weakness 08/21/2017  . COPD exacerbation ([place]) 06/12/2017  . Chest pain with moderate risk for cardiac etiology 05/30/2017  . Abnormal LFTs 05/30/2017  . Tachycardia 05/30/2017  . COPD (chronic obstructive pulmonary disease) ([place]) 04/19/2017  . Right rib fracture 04/15/2017  . Abdominal wall  cellulitis 12/30/2016  . S/P exploratory laparotomy 12/30/2016  . Distal radius fracture, left 09/09/2016  . Thrombocytopenia ([place]) 09/09/2016  . Chest x-ray abnormality   . Umbilical hernia without obstruction and without gangrene 05/12/2016  . Itching 02/24/2016  . Ventral hernia without obstruction or gangrene 02/24/2016  . Goals of care, counseling/discussion   . Ascites   . Tachypnea   . Atrial flutter with rapid ventricular response ([place])   . Hypomagnesemia   . Hypoxia   . Dehydration 01/22/2016  . Low back ache 12/30/2015  . Non-intractable vomiting with nausea   . Alcohol abuse with alcohol-induced mood disorder ([place]) 12/15/2015  . Acute alcoholism ([place])   . Distended abdomen 11/04/2015  . Alcohol abuse   . Dyspnea   . Acute respiratory failure ([place]) 10/15/2015  . Ascites due to alcoholic cirrhosis ([place]) 48/54/6270  . Osteoporosis   . Multiple sclerosis ([place])   . Alcoholic cirrhosis of liver with ascites ([place]) 08/22/2015  . Bipolar disorder, current episode mixed, moderate ([place]) 08/01/2015  . Alcohol use disorder, severe, dependence ([place]) 07/31/2015  . Macrocytic anemia- due to alcohol abuse with normal B12 & folate levels 05/31/2015  . Severe protein-calorie malnutrition ([place]) 05/31/2015  . Hypokalemia 05/26/2015  . Alcohol withdrawal ([place]) 05/18/2015  . Abdominal pain 05/18/2015  . Anxiety 05/18/2015  . Stimulant abuse ([place]) 12/27/2014  . Nicotine abuse 12/27/2014  . Hyperprolactinemia ([place]) 11/15/2014  . Dyslipidemia   . Alcohol use disorder, moderate, dependence ([place]) 10/30/2014  . Tobacco abuse 01/23/2014  . Bipolar 1 disorder ([place]) 04/07/2013  . Noncompliance with therapeutic plan 04/04/2013  . Hereditary and idiopathic peripheral neuropathy 05/01/2012  . Depression 05/01/2012  . GERD (gastroesophageal reflux disease) 05/01/2012  . Osteoarthrosis, unspecified whether generalized or localized, involving lower leg 05/01/2012  . Hyponatremia 07/01/2011    PCP:  Patient, No Pcp Per Pharmacy:   [facility] [place], [place] - [address] [place] [address] [place] [address] Phone: [phone] Fax: [phone]     Social Determinants of Health (SDOH) Interventions    Readmission Risk Interventions No flowsheet data found.

## 2018-08-23 ENCOUNTER — Emergency Department (HOSPITAL_COMMUNITY): Admission: EM | Admit: 2018-08-23 | Discharge: 2018-08-23 | Discharge status: Against Medical Advice | Payer: Self-pay

## 2018-08-23 NOTE — ED Notes (Signed)
This RN attempted to triage this pt and pt would not sit in the bed from the EMS stretcher and requested to leave. Informed pt that she would be leaving AMA and she understood and still wanted to leave. Pt transferred self to wheelchair and taken out to waiting room with belongings and given a phone number for a cab. Pt was in no acute distress and last seen alert.

## 2018-08-24 IMAGING — DX DG CHEST 2V
2 series · 2 of 2 positions shown · non-contrast
Comparison: 08/22/2015

CLINICAL DATA: Chest pain, shortness of breath, nausea

EXAM:
CHEST  2 VIEW

[x chest ap]
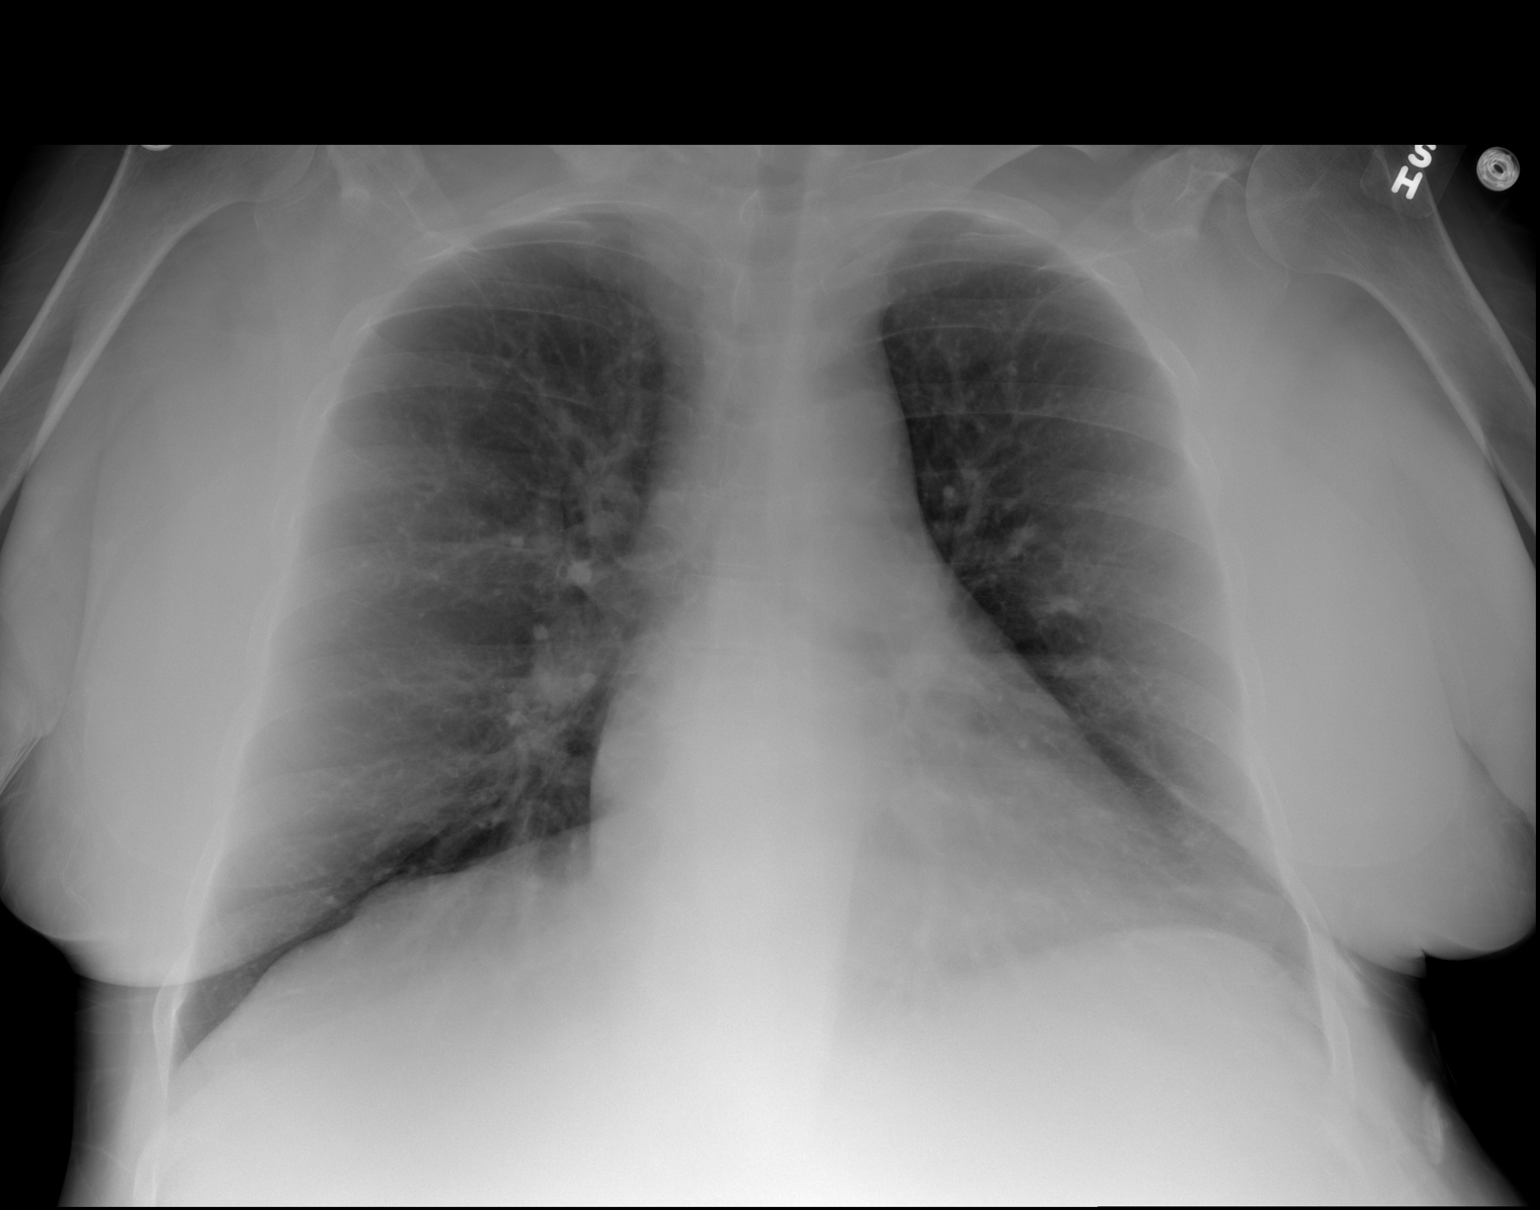

[w chest lat]
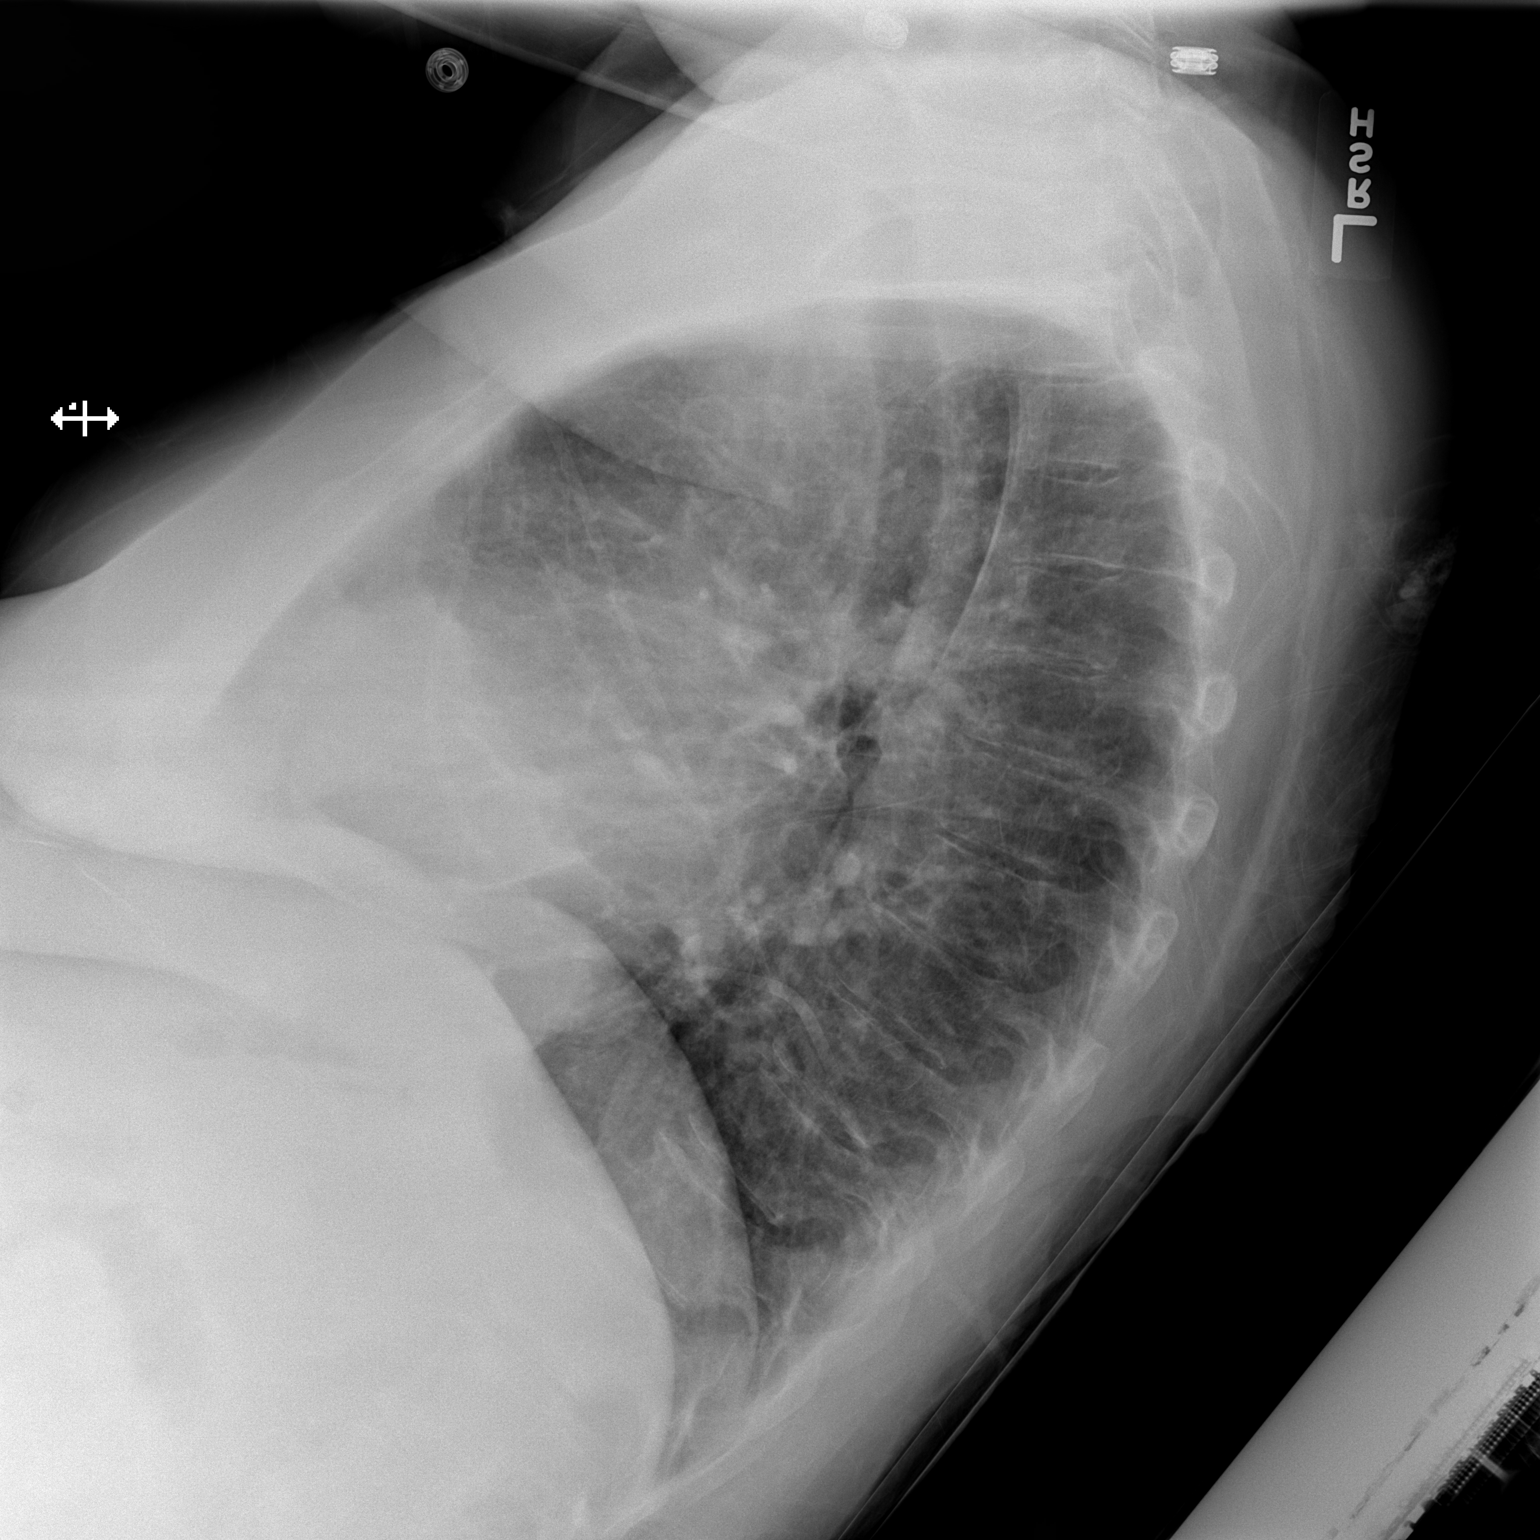

[2 of 2 positions shown; findings below may reference images not displayed]

FINDINGS: Lungs are clear.  No pleural effusion or pneumothorax.

The heart is normal in size.

Visualized osseous structures are within normal limits.
IMPRESSION: Normal chest radiographs.

## 2018-08-24 IMAGING — CT CT ABD-PELV W/ CM
2 of 5 series · 16 of 46 positions shown, 18 images · IV contrast (APPLIED)
Comparison: 05/18/2015

CLINICAL DATA: Diffuse abdominal pain, cirrhosis, ascites

EXAM:
CT ABDOMEN AND PELVIS WITH CONTRAST
TECHNIQUE: Multidetector CT imaging of the abdomen and pelvis was performed
using the standard protocol following bolus administration of
intravenous contrast.
CONTRAST:  100mL 0H2THL-KCC IOPAMIDOL (0H2THL-KCC) INJECTION 61%

[Series 2: abd/ pelvis 5.0 i30f 1 · axial · 0.77mm/px · z∈[+710,+1110]mm · 13 of 90 slices shown, 15 images]
[im 5/90  soft-tissue]
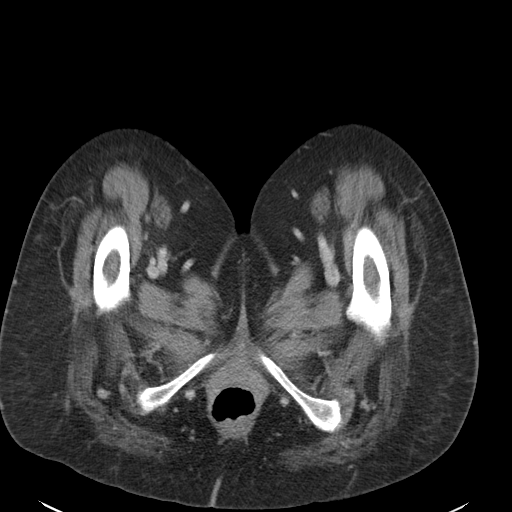
[im 5/90  bone]
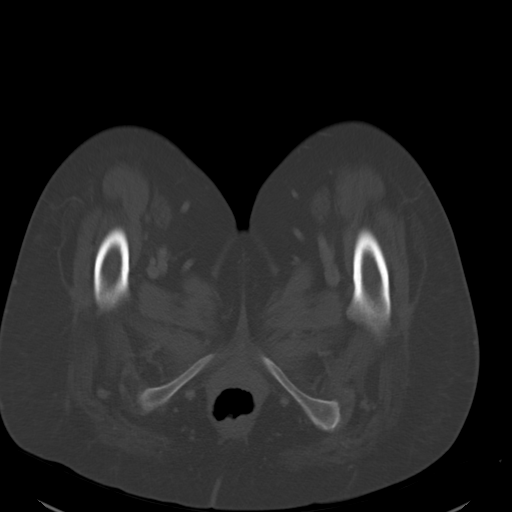
[im 15/90  soft-tissue]
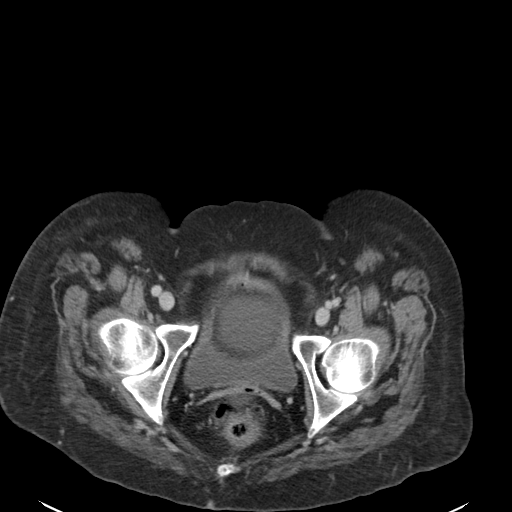
[im 19/90  soft-tissue]
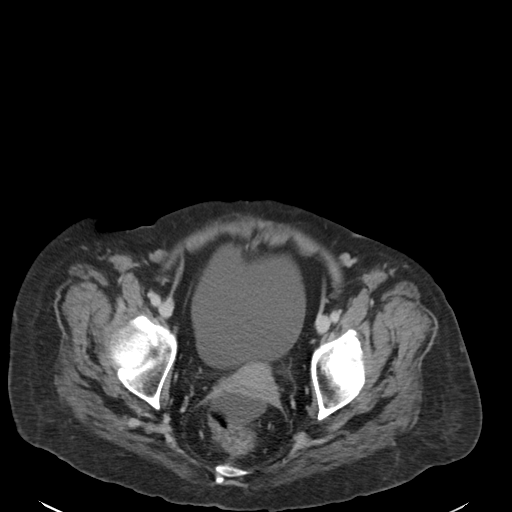
[im 24/90  soft-tissue]
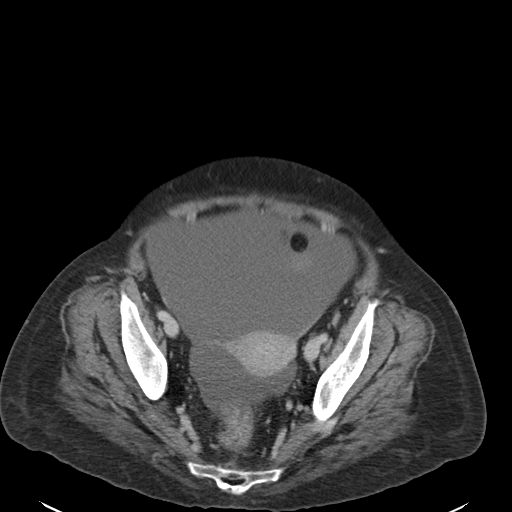
[im 33/90  soft-tissue]
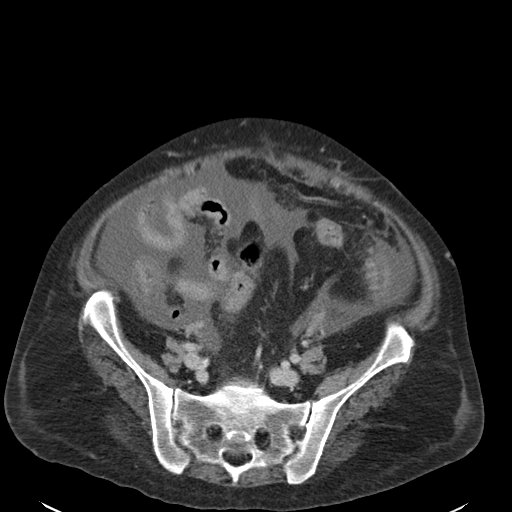
[im 38/90  soft-tissue]
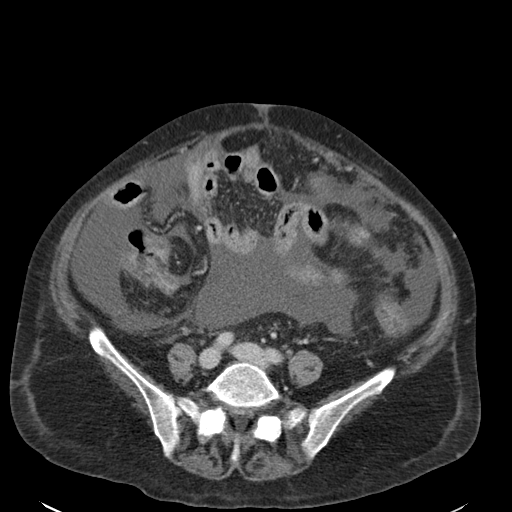
[im 47/90  soft-tissue]
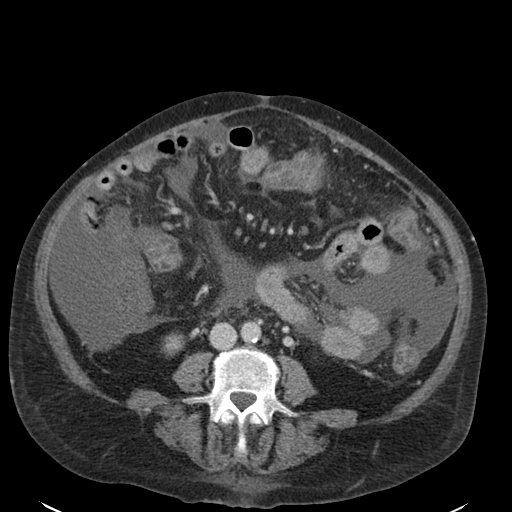
[im 52/90  soft-tissue]
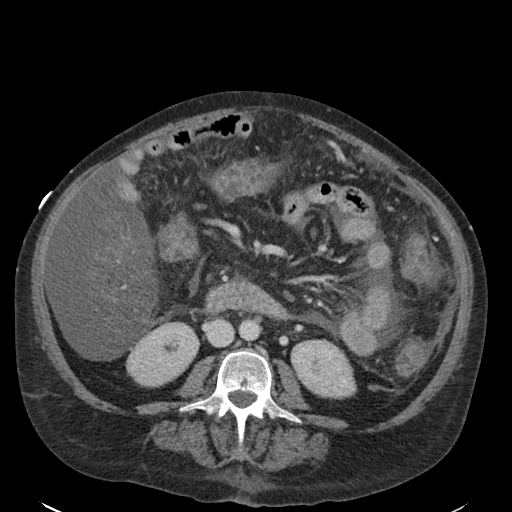
[im 57/90  soft-tissue]
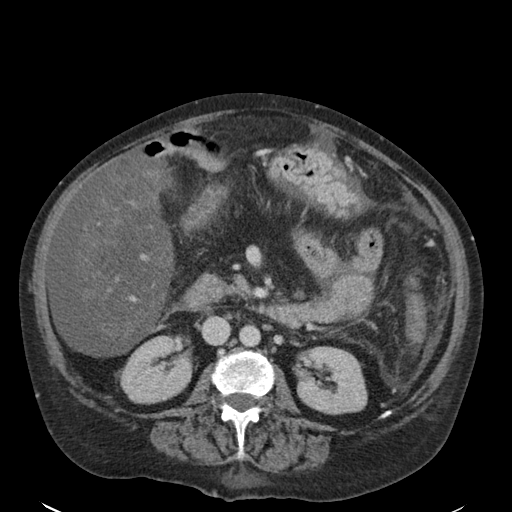
[im 57/90  bone]
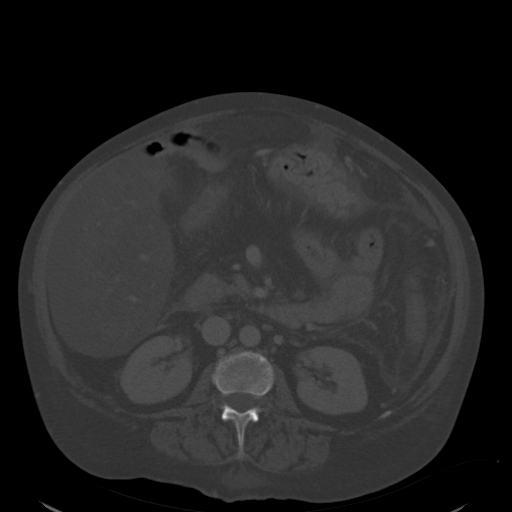
[im 66/90  soft-tissue]
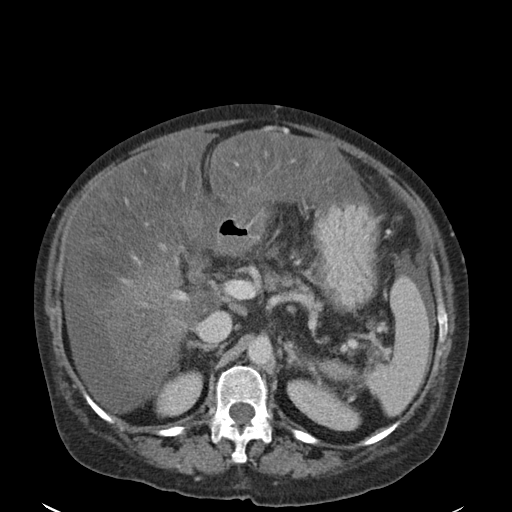
[im 71/90  soft-tissue]
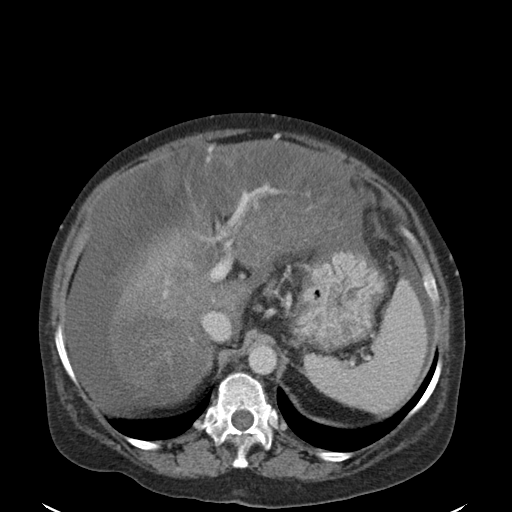
[im 75/90  soft-tissue]
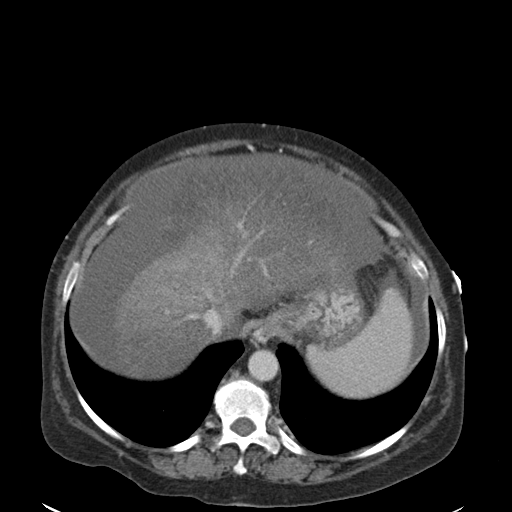
[im 85/90  soft-tissue]
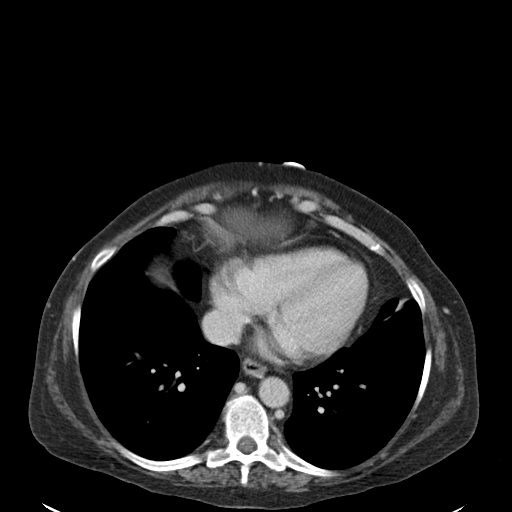

[Series 5: coronal soft tissue · coronal · 0.77mm/px · 3 of 110 slices shown]
[im 37/110  soft-tissue]
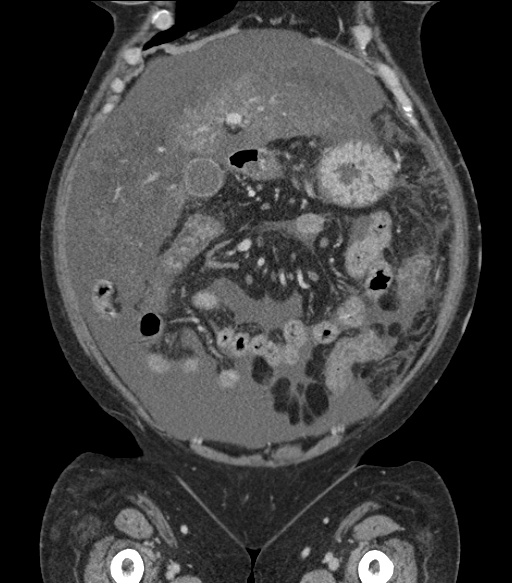
[im 49/110  soft-tissue]
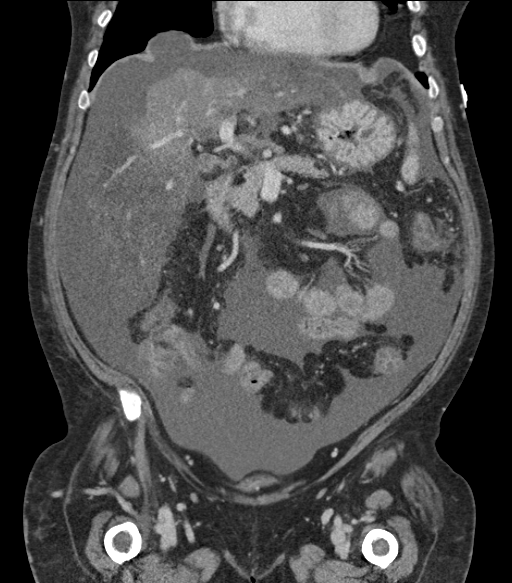
[im 61/110  soft-tissue]
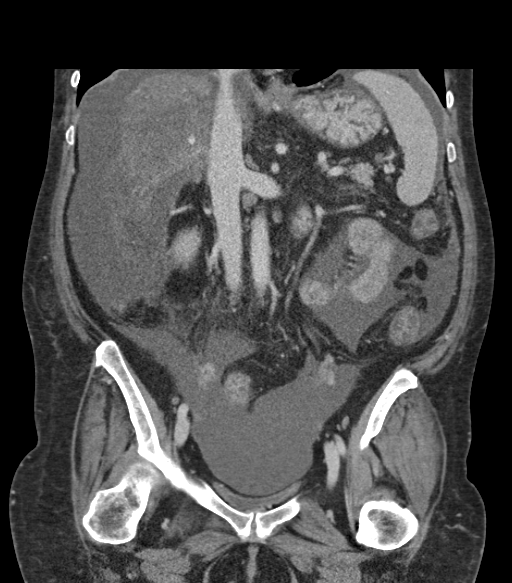

[16 of 46 positions shown; findings below may reference images not displayed]

FINDINGS: The lung bases are unremarkable

Small hiatal hernia is noted.

Again noted significant fatty infiltration of the liver. Nodular
contour of the liver consistent with cirrhosis. Moderate perihepatic
and perisplenic ascites.

Moderate ascites is noted bilateral paracolic gutters. Moderate
ascites mid lower mesentery.

Enhanced pancreas is unremarkable. Enhanced spleen is unremarkable.
The spleen measures 10.4 cm in length.

No adrenal gland mass. Enhanced kidneys are symmetrical in size. No
hydronephrosis or hydroureter.

Delayed renal images shows bilateral renal symmetrical excretion.
Bilateral visualized proximal ureter is unremarkable.

No calcified calculi are noted within under distended urinary
bladder.

There is no small bowel obstruction. No pericecal inflammation.
Normal appendix is partially visualized. No distal colonic
obstruction.

The uterus is atrophic. No adnexal masses noted. Moderate pelvic
ascites.

Some stool and gas noted within rectum.

There is no inguinal adenopathy.  No evidence of free abdominal air.

Sagittal images of the spine shows no destructive bony lesions. Mild
degenerative changes lower thoracic spine.

No destructive bony lesions are noted within pelvis.
IMPRESSION: 1. Again noted subtle nodular contour of the liver consistent with
cirrhosis. Significant geographic fatty infiltration of the liver.
2. No hydronephrosis or hydroureter.
3. Abdominal and pelvic ascites is noted.
4. No small bowel obstruction. No pericecal inflammation. Normal
appendix partially visualized.
5. No adnexal mass.  Atrophic uterus.

## 2018-08-27 ENCOUNTER — Emergency Department (HOSPITAL_COMMUNITY)
Admission: EM | Admit: 2018-08-27 | Discharge: 2018-08-27 | Disposition: A | Discharge status: Home / Self Care | Payer: Self-pay | Attending: Emergency Medicine | Admitting: Emergency Medicine

## 2018-08-27 DIAGNOSIS — R251 Tremor, unspecified: Secondary | ICD-10-CM | POA: Insufficient documentation

## 2018-08-27 DIAGNOSIS — Z5321 Procedure and treatment not carried out due to patient leaving prior to being seen by health care provider: Secondary | ICD-10-CM | POA: Insufficient documentation

## 2018-08-27 NOTE — ED Triage Notes (Signed)
Pt BIBA from home.   Per EMS- Pt reports she woke up with tremors today.  Wanted to be evaluated.

## 2018-08-27 NOTE — ED Notes (Addendum)
Pt seen to be standing in doorway, demanding to speak with a provider.  Pt informed that provider was aware she was here, that she would be seen soon, please wait in the room for them to see you.  Pt began yelling, saying she wasn't going to wait, she was leaving.  Pt began walking down hallway, shouting "how do I get out of here?!"  Pt informed that she need to return to room, she would be leaving against medical advice.  Pt continued to walk out.  Pt ambulatory.  Charge RN made aware of situation.

## 2018-08-28 ENCOUNTER — Encounter (HOSPITAL_COMMUNITY): Payer: Self-pay

## 2018-08-28 ENCOUNTER — Emergency Department (HOSPITAL_COMMUNITY)
Admission: EM | Admit: 2018-08-28 | Discharge: 2018-08-28 | Discharge status: Against Medical Advice | Payer: Self-pay | Attending: Emergency Medicine | Admitting: Emergency Medicine

## 2018-08-28 DIAGNOSIS — Z5321 Procedure and treatment not carried out due to patient leaving prior to being seen by health care provider: Secondary | ICD-10-CM | POA: Insufficient documentation

## 2018-08-28 NOTE — ED Triage Notes (Signed)
Pt BIBA from home.   Pt recently dx with UTI. Pt wants refill on gabapentin. Pt is c/o acid reflux today. Pt states that she hasn't drank in 5 days.

## 2018-08-28 NOTE — ED Notes (Signed)
Pt not visualized in lobby 

## 2018-08-28 NOTE — ED Notes (Signed)
Pt sts she has called for a ride and is waiting to be picked up at this time

## 2018-09-01 ENCOUNTER — Encounter (HOSPITAL_COMMUNITY): Payer: Self-pay | Admitting: Emergency Medicine

## 2018-09-01 ENCOUNTER — Emergency Department (HOSPITAL_COMMUNITY)
Admission: EM | Admit: 2018-09-01 | Discharge: 2018-09-01 | Disposition: A | Discharge status: Home / Self Care | Payer: Self-pay | Attending: Emergency Medicine | Admitting: Emergency Medicine

## 2018-09-01 DIAGNOSIS — R5381 Other malaise: Secondary | ICD-10-CM | POA: Insufficient documentation

## 2018-09-01 DIAGNOSIS — F10920 Alcohol use, unspecified with intoxication, uncomplicated: Secondary | ICD-10-CM | POA: Insufficient documentation

## 2018-09-01 DIAGNOSIS — J449 Chronic obstructive pulmonary disease, unspecified: Secondary | ICD-10-CM | POA: Insufficient documentation

## 2018-09-01 DIAGNOSIS — F17210 Nicotine dependence, cigarettes, uncomplicated: Secondary | ICD-10-CM | POA: Insufficient documentation

## 2018-09-01 DIAGNOSIS — Z79899 Other long term (current) drug therapy: Secondary | ICD-10-CM | POA: Insufficient documentation

## 2018-09-01 DIAGNOSIS — G35 Multiple sclerosis: Secondary | ICD-10-CM | POA: Insufficient documentation

## 2018-09-01 NOTE — ED Notes (Signed)
Pt refused vital signs.

## 2018-09-01 NOTE — Discharge Instructions (Addendum)
Avoid all forms of alcohol.  Follow-up with your primary care doctor for medical management.  Follow-up with the alcohol treatment center of your choice.  Go to AA.

## 2018-09-01 NOTE — ED Notes (Signed)
Patient refused vital signs again.

## 2018-09-01 NOTE — ED Triage Notes (Signed)
Per EMS-patient called EMS to take her to ED because she wants [place] to fix her incontinence-states patient complaining of loose stool-states paramedic was out earlier and cleaned her up-there were 4 empty LOCO's at her house-patient has a Education officer, museum working with her-patient appears to be abusing resources presented to her, not willing to abide to care plan-patient refusing triage process

## 2018-09-01 NOTE — ED Provider Notes (Signed)
[facility]-EMERGENCY DEPT Provider Note   CSN: [id number] Arrival date & time: 09/01/18  1019    History   Chief Complaint Chief Complaint  Patient presents with  . Alcohol Intoxication    HPI Anne Black is a 58 y.o. female.     HPI   Patient presents by EMS for evaluation of several problems.  She describes having a sore on her bottom, reflux, and shortness of breath.  She also requests placement in a facility for alcoholism.  Patient is well-known to me as well as many providers in the emergency department, and has a care plan.  The updated care plan was reviewed by me.  Patient also requested that her care plan be changed so that EMS does not bring her to [facility] but rather to [facility].  After discussion of the history stated above, patient reported to me that she did not want any care at [facility] department today.  There are no other known modifying factors.     Past Medical History:  Diagnosis Date  . ADHD (attention deficit hyperactivity disorder)   . Alcoholic cirrhosis (HCC)    with ascites  . Alcoholism (HCC)   . Allergies   . Anxiety   . Arthritis   . Atrial fibrillation with RVR (HCC)   . Bipolar disorder (HCC)   . COPD (chronic obstructive pulmonary disease) (HCC)    wears 2L chronic O2  . Dyspnea   . EtOH dependence (HCC) 05/14/2018  . GERD (gastroesophageal reflux disease)   . History of hiatal hernia   . Hypokalemia   . Migraine   . Multiple sclerosis (HCC)   . Narcolepsy   . Osteoarthritis of knee   . Osteoporosis   . Pneumonia   . UTI (urinary tract infection) 08/11/2018    Patient Active Problem List   Diagnosis Date Noted  . Anemia 08/16/2018  . Palliative care by specialist   . Cellulitis of lower extremity   . Peripheral edema   . Atrial fibrillation with RVR (HCC) 06/13/2018  . Alcohol dependence (HCC) 05/06/2018  . Electrolyte disturbance 05/05/2018  . Alcohol withdrawal  delirium (HCC) 04/28/2018  . Weakness generalized 04/27/2018  . Gait abnormality 04/24/2018  . Chronic hypoxemic respiratory failure (HCC) 04/11/2018  . Atrial fibrillation with rapid ventricular response (HCC) 03/23/2018  . Head injury   . Fall 03/19/2018  . Syncope 03/19/2018  . Orbital fracture 03/19/2018  . Scalp laceration 03/19/2018  . Acute respiratory failure with hypoxia and hypercarbia (HCC) 03/19/2018  . Abdominal tenderness 03/19/2018  . Acute on chronic respiratory failure with hypoxia (HCC) 03/17/2018  . Alcohol abuse with intoxication (HCC) 03/17/2018  . ETOH abuse   . Normocytic anemia 03/16/2018  . Acute metabolic encephalopathy 03/16/2018  . Lower extremity edema 03/16/2018  . Migraine 03/16/2018  . Community acquired pneumonia of left lower lobe of lung (HCC)   . Weakness 08/21/2017  . COPD exacerbation (HCC) 06/12/2017  . Chest pain with moderate risk for cardiac etiology 05/30/2017  . Abnormal LFTs 05/30/2017  . Tachycardia 05/30/2017  . COPD (chronic obstructive pulmonary disease) (HCC) 04/19/2017  . Right rib fracture 04/15/2017  . Abdominal wall cellulitis 12/30/2016  . S/P exploratory laparotomy 12/30/2016  . Distal radius fracture, left 09/09/2016  . Thrombocytopenia (HCC) 09/09/2016  . Chest x-ray abnormality   . Umbilical hernia without obstruction and without gangrene 05/12/2016  . Itching 02/24/2016  . Ventral hernia without obstruction or gangrene 02/24/2016  .  Goals of care, counseling/discussion   . Ascites   . Tachypnea   . Atrial flutter with rapid ventricular response ([place])   . Hypomagnesemia   . Hypoxia   . Dehydration 01/22/2016  . Low back ache 12/30/2015  . Non-intractable vomiting with nausea   . Alcohol abuse with alcohol-induced mood disorder ([place]) 12/15/2015  . Acute alcoholism ([place])   . Distended abdomen 11/04/2015  . Alcohol abuse   . Dyspnea   . Acute respiratory failure ([place]) 10/15/2015  . Ascites due to alcoholic  cirrhosis ([place]) 10/15/2015  . Osteoporosis   . Multiple sclerosis ([place])   . Alcoholic cirrhosis of liver with ascites ([place]) 08/22/2015  . Bipolar disorder, current episode mixed, moderate ([place]) 08/01/2015  . Alcohol use disorder, severe, dependence ([place]) 07/31/2015  . Macrocytic anemia- due to alcohol abuse with normal B12 & folate levels 05/31/2015  . Severe protein-calorie malnutrition ([place]) 05/31/2015  . Hypokalemia 05/26/2015  . Alcohol withdrawal ([place]) 05/18/2015  . Abdominal pain 05/18/2015  . Anxiety 05/18/2015  . Stimulant abuse ([place]) 12/27/2014  . Nicotine abuse 12/27/2014  . Hyperprolactinemia ([place]) 11/15/2014  . Dyslipidemia   . Alcohol use disorder, moderate, dependence ([place]) 10/30/2014  . Tobacco abuse 01/23/2014  . Bipolar 1 disorder ([place]) 04/07/2013  . Noncompliance with therapeutic plan 04/04/2013  . Hereditary and idiopathic peripheral neuropathy 05/01/2012  . Depression 05/01/2012  . GERD (gastroesophageal reflux disease) 05/01/2012  . Osteoarthrosis, unspecified whether generalized or localized, involving lower leg 05/01/2012  . Hyponatremia 07/01/2011    Past Surgical History:  Procedure Laterality Date  . ABDOMINAL WALL DEFECT REPAIR N/A 12/30/2016   Procedure: EXPLORATORY LAPAROTOMY WITH REPAIR ABDOMINAL WALL VENTRAL HERNIA;  Surgeon: Rolm Bookbinder, MD;  Location: [facility];  Service: General;  Laterality: N/A;  . APPLICATION OF WOUND VAC N/A 12/30/2016   Procedure: APPLICATION OF WOUND VAC;  Surgeon: Rolm Bookbinder, MD;  Location: [place];  Service: General;  Laterality: N/A;  . CESAREAN SECTION  [phone]  . FRACTURE SURGERY    . HERNIA REPAIR    . IR PARACENTESIS  06/14/2018  . IR PARACENTESIS  08/14/2018  . IR PARACENTESIS  08/18/2018  . MYRINGOTOMY WITH TUBE PLACEMENT Bilateral   . ORIF WRIST FRACTURE Left 09/09/2016   Procedure: OPEN REDUCTION INTERNAL FIXATION (ORIF) LEFT WRIST FRACTURE, LEFT CARPAL TUNNEL RELEASE;  Surgeon: Roseanne Kaufman, MD;   Location: [facility];  Service: Orthopedics;  Laterality: Left;  . TONSILLECTOMY       OB History   No obstetric history on file.      Home Medications    Prior to Admission medications   Medication Sig Start Date End Date Taking? Authorizing Provider  folic acid (FOLVITE) 1 MG tablet Take 1 tablet (1 mg total) by mouth daily. 08/21/18   Samuella Cota, MD  furosemide (LASIX) 40 MG tablet Take 1 tablet (40 mg total) by mouth daily. 06/27/18   Maudie Flakes, MD  gabapentin (NEURONTIN) 100 MG capsule Take 2 capsules (200 mg total) by mouth 3 (three) times daily for 30 days. 07/19/18 08/18/18  Domenic Moras, PA-C  hydrOXYzine (ATARAX/VISTARIL) 25 MG tablet Take 1 tablet (25 mg total) by mouth daily as needed for up to 30 doses (anxiety). 04/14/18   Tegeler, Gwenyth Allegra, MD  lactulose (CHRONULAC) 10 GM/15ML solution Take 30 mLs (20 g total) by mouth 2 (two) times daily. 08/20/18   Samuella Cota, MD  LORazepam (ATIVAN) 0.5 MG tablet Take 1 tablet (0.5 mg total) by mouth every 12 (  twelve) hours as needed for anxiety. 08/20/18   Standley BrookingGoodrich, Daniel P, MD  Multiple Vitamin (MULTIVITAMIN WITH MINERALS) TABS tablet Take 1 tablet by mouth daily. 08/21/18   Standley BrookingGoodrich, Daniel P, MD  ondansetron (ZOFRAN) 4 MG tablet Take 1 tablet (4 mg total) by mouth every 6 (six) hours. 08/22/18   Terrilee FilesButler, Michael C, MD  pantoprazole (PROTONIX) 20 MG tablet Take 2 tablets (40 mg total) by mouth daily. 07/29/18   Mesner, Barbara CowerJason, MD  potassium chloride SA (K-DUR,KLOR-CON) 20 MEQ tablet Take 2 tablets (40 mEq total) by mouth 2 (two) times daily. 04/19/18   Bethel BornGekas, Kelly Marie, PA-C  spironolactone (ALDACTONE) 25 MG tablet Take 1 tablet (25 mg total) by mouth daily for 30 days. 06/27/18 08/10/18  Sabas SousBero, Michael M, MD  thiamine 100 MG tablet Take 1 tablet (100 mg total) by mouth daily. 08/21/18   Standley BrookingGoodrich, Daniel P, MD    Family History Family History  Problem Relation Age of Onset  . Arrhythmia Mother   . Neuropathy Mother   .  Coronary artery disease Mother   . Heart disease Mother   . Heart disease Father   . Hypertension Father   . Heart attack Father   . Multiple sclerosis Maternal Aunt     Social History Social History   Tobacco Use  . Smoking status: Current Every Day Smoker    Packs/day: 1.00    Years: 36.00    Pack years: 36.00    Types: Cigarettes  . Smokeless tobacco: Never Used  Substance Use Topics  . Alcohol use: Yes    Comment: Chronic ETOH abuse  . Drug use: No     Allergies   Patient has no known allergies.   Review of Systems Review of Systems  All other systems reviewed and are negative.    Physical Exam Updated Vital Signs There were no vitals taken for this visit.  Physical Exam Vitals signs and nursing note reviewed.  Constitutional:      Appearance: She is well-developed. She is obese.     Comments: Patient actually looks better than usual, she is well-dressed today appears fairly clean, and is much brighter in her interactions with me.  HENT:     Head: Normocephalic and atraumatic.     Right Ear: External ear normal.     Left Ear: External ear normal.  Eyes:     Conjunctiva/sclera: Conjunctivae normal.     Pupils: Pupils are equal, round, and reactive to light.  Neck:     Musculoskeletal: Normal range of motion and neck supple.     Trachea: Phonation normal.  Cardiovascular:     Rate and Rhythm: Normal rate.  Pulmonary:     Effort: Pulmonary effort is normal.  Musculoskeletal: Normal range of motion.  Skin:    General: Skin is warm and dry.  Neurological:     Mental Status: She is alert and oriented to person, place, and time.     Cranial Nerves: No cranial nerve deficit.     Sensory: No sensory deficit.     Motor: No abnormal muscle tone.     Coordination: Coordination normal.     Comments: Mild dysarthria is present consistent with alcohol intoxication.  There is no aphasia, or nystagmus.  Patient was ambulating per her usual, with her walker.   Psychiatric:        Mood and Affect: Mood normal.        Behavior: Behavior normal.      ED Treatments /  Results  Labs (all labs ordered are listed, but only abnormal results are displayed) Labs Reviewed - No data to display  EKG None  Radiology No results found.  Procedures Procedures (including critical care time)  Medications Ordered in ED Medications - No data to display   Initial Impression / Assessment and Plan / ED Course  I have reviewed the triage vital signs and the nursing notes.  Pertinent labs & imaging results that were available during my care of the patient were reviewed by me and considered in my medical decision making (see chart for details).         No data found.  10:43 AM Reevaluation with update and discussion. After initial assessment and treatment, an updated evaluation reveals since the patient is not wanting to proceed with care here she will be discharged. Mancel Bale   Medical Decision Making: Alcoholism with alcohol intoxication and chronic multiple complaints.  Clinically she appears better than her baseline.  CRITICAL CARE-no Performed by: Mancel Bale  Nursing Notes Reviewed/ Care Coordinated Applicable Imaging Reviewed Interpretation of Laboratory Data incorporated into ED treatment  The patient appears reasonably screened and/or stabilized for discharge and I doubt any other medical condition or other [facility] requiring further screening, evaluation, or treatment in the ED at this time prior to discharge.  Plan: Home Medications-continue usual; Home Treatments-rest, fluids; avoid alcohol, return here if the recommended treatment, does not improve the symptoms; Recommended follow up-PCP, PRN.  Alcohol treatment center of choice   Final Clinical Impressions(s) / ED Diagnoses   Final diagnoses:  Alcoholic intoxication without complication [facility])  [facility]    ED Discharge Orders    None       Mancel Bale, MD 09/01/18  1045

## 2018-09-01 NOTE — ED Notes (Signed)
Patient given discharge instructions. Patient refused to sign discharge . Security and off duty [employer] notified that the patient is refusing to leave. Security and off duty [employer] at the doorway. Patient ambulated with her walker out of the triage area while being escorted by off duty [employer] and security.

## 2018-09-05 ENCOUNTER — Emergency Department (HOSPITAL_COMMUNITY)
Admission: EM | Admit: 2018-09-05 | Discharge: 2018-09-05 | Disposition: A | Discharge status: Home / Self Care | Payer: Self-pay | Attending: Emergency Medicine | Admitting: Emergency Medicine

## 2018-09-05 ENCOUNTER — Encounter (HOSPITAL_COMMUNITY): Payer: Self-pay

## 2018-09-05 DIAGNOSIS — I4891 Unspecified atrial fibrillation: Secondary | ICD-10-CM | POA: Insufficient documentation

## 2018-09-05 DIAGNOSIS — J449 Chronic obstructive pulmonary disease, unspecified: Secondary | ICD-10-CM | POA: Insufficient documentation

## 2018-09-05 DIAGNOSIS — R110 Nausea: Secondary | ICD-10-CM | POA: Insufficient documentation

## 2018-09-05 DIAGNOSIS — Z79899 Other long term (current) drug therapy: Secondary | ICD-10-CM | POA: Insufficient documentation

## 2018-09-05 DIAGNOSIS — F17210 Nicotine dependence, cigarettes, uncomplicated: Secondary | ICD-10-CM | POA: Insufficient documentation

## 2018-09-05 DIAGNOSIS — Z760 Encounter for issue of repeat prescription: Secondary | ICD-10-CM | POA: Insufficient documentation

## 2018-09-05 MED ORDER — ONDANSETRON 8 MG PO TBDP: 8.0000 mg | ORAL_TABLET | Freq: Three times a day (TID) | ORAL | 0 refills | Status: AC | PRN

## 2018-09-05 MED ORDER — ONDANSETRON 8 MG PO TBDP: 8.0000 mg | ORAL_TABLET | Freq: Once | ORAL | Status: DC

## 2018-09-05 NOTE — TOC Initial Note (Addendum)
Transition of Care [facility](TOC) - Initial/Assessment Note    Patient Details  Name: Anne ApplebaumCarla S Black MRN: [id number] Date of Birth: Aug 11, 1960  Transition of Care [facility](TOC) CM/SW Contact:    Elliot CousinShavis, Aftan Vint Ellen, RN Phone Number: [phone] 09/05/2018, 5:29 PM  Clinical Narrative:     45 ED visits/10 hospital admission in six months            Spoke to pt and states she has GERD and anxiety and feels she needs to come to ED with these symptoms. TOC CM reviewed foods that may cause GERD to worsen. Pt agreeable to follow up appt with PCP. States she uses taxi to go to her appts. Will arrange appt for pt on 09/06/2018.  TOC CM mailed out education brochure from [facility] teaching materials on GERD and information about appt. Spoke to dtr and states she will be will to take her to the appt on 09/14/2018 at [facility] at 1:30 pm.   Expected Discharge Plan: Home/Self Care Barriers to Discharge: No Barriers Identified   Patient Goals and CMS Choice        Expected Discharge Plan and Services Expected Discharge Plan: Home/Self Care   Discharge Planning Services: CM Consult   Living arrangements for the past 2 months: Single Family Home                                      Prior Living Arrangements/Services Living arrangements for the past 2 months: Single Family Home Lives with:: Self Patient language and need for interpreter reviewed:: Yes Do you feel safe going back to the place where you live?: Yes      Need for Family Participation in Patient Care: Yes (Comment) Care giver support system in place?: Yes (comment)   Criminal Activity/Legal Involvement Pertinent to Current Situation/Hospitalization: No - Comment as needed  Activities of Daily Living      Permission Sought/Granted   Permission granted to share information with : Yes, Verbal Permission Granted  Share Information with NAME: Sallye OberGarland, Olivia     Permission granted to share info w Relationship: daughter  Permission  granted to share info w Contact Information: [phone]  Emotional Assessment           Psych Involvement: No (comment)  Admission diagnosis:  anxiety abdominal pain Patient Active Problem List   Diagnosis Date Noted  . Anemia 08/16/2018  . Palliative care by specialist   . Cellulitis of lower extremity   . Peripheral edema   . Atrial fibrillation with RVR (HCC) 06/13/2018  . Alcohol dependence (HCC) 05/06/2018  . Electrolyte disturbance 05/05/2018  . Alcohol withdrawal delirium (HCC) 04/28/2018  . Weakness generalized 04/27/2018  . Gait abnormality 04/24/2018  . Chronic hypoxemic respiratory failure (HCC) 04/11/2018  . Atrial fibrillation with rapid ventricular response (HCC) 03/23/2018  . Head injury   . Fall 03/19/2018  . Syncope 03/19/2018  . Orbital fracture 03/19/2018  . Scalp laceration 03/19/2018  . Acute respiratory failure with hypoxia and hypercarbia (HCC) 03/19/2018  . Abdominal tenderness 03/19/2018  . Acute on chronic respiratory failure with hypoxia (HCC) 03/17/2018  . Alcohol abuse with intoxication (HCC) 03/17/2018  . ETOH abuse   . Normocytic anemia 03/16/2018  . Acute metabolic encephalopathy 03/16/2018  . Lower extremity edema 03/16/2018  . Migraine 03/16/2018  . Community acquired pneumonia of left lower lobe of lung (HCC)   . Weakness 08/21/2017  .  COPD exacerbation ([place]) 06/12/2017  . Chest pain with moderate risk for cardiac etiology 05/30/2017  . Abnormal LFTs 05/30/2017  . Tachycardia 05/30/2017  . COPD (chronic obstructive pulmonary disease) ([place]) 04/19/2017  . Right rib fracture 04/15/2017  . Abdominal wall cellulitis 12/30/2016  . S/P exploratory laparotomy 12/30/2016  . Distal radius fracture, left 09/09/2016  . Thrombocytopenia ([place]) 09/09/2016  . Chest x-ray abnormality   . Umbilical hernia without obstruction and without gangrene 05/12/2016  . Itching 02/24/2016  . Ventral hernia without obstruction or gangrene 02/24/2016  .  Goals of care, counseling/discussion   . Ascites   . Tachypnea   . Atrial flutter with rapid ventricular response ([place])   . Hypomagnesemia   . Hypoxia   . Dehydration 01/22/2016  . Low back ache 12/30/2015  . Non-intractable vomiting with nausea   . Alcohol abuse with alcohol-induced mood disorder ([place]) 12/15/2015  . Acute alcoholism ([place])   . Distended abdomen 11/04/2015  . Alcohol abuse   . Dyspnea   . Acute respiratory failure ([place]) 10/15/2015  . Ascites due to alcoholic cirrhosis ([place]) 67/61/9509  . Osteoporosis   . Multiple sclerosis ([place])   . Alcoholic cirrhosis of liver with ascites ([place]) 08/22/2015  . Bipolar disorder, current episode mixed, moderate ([place]) 08/01/2015  . Alcohol use disorder, severe, dependence ([place]) 07/31/2015  . Macrocytic anemia- due to alcohol abuse with normal B12 & folate levels 05/31/2015  . Severe protein-calorie malnutrition ([place]) 05/31/2015  . Hypokalemia 05/26/2015  . Alcohol withdrawal ([place]) 05/18/2015  . Abdominal pain 05/18/2015  . Anxiety 05/18/2015  . Stimulant abuse ([place]) 12/27/2014  . Nicotine abuse 12/27/2014  . Hyperprolactinemia ([place]) 11/15/2014  . Dyslipidemia   . Alcohol use disorder, moderate, dependence ([place]) 10/30/2014  . Tobacco abuse 01/23/2014  . Bipolar 1 disorder ([place]) 04/07/2013  . Noncompliance with therapeutic plan 04/04/2013  . Hereditary and idiopathic peripheral neuropathy 05/01/2012  . Depression 05/01/2012  . GERD (gastroesophageal reflux disease) 05/01/2012  . Osteoarthrosis, unspecified whether generalized or localized, involving lower leg 05/01/2012  . Hyponatremia 07/01/2011   PCP:  Patient, No Pcp Per Pharmacy:   [facility] [place], [place] - [address] [place] [address] [place] [address] [place] [address] [place] [address] Phone: [phone] Fax: [phone]     Social Determinants of Health (SDOH) Interventions    Readmission Risk Interventions No flowsheet  data found.

## 2018-09-05 NOTE — ED Provider Notes (Signed)
[name]-EMERGENCY DEPT Provider Note   CSN: [id number] Arrival date & time: 09/05/18  [id number]     History   Chief Complaint Chief Complaint  Patient presents with  . Abdominal Pain    HPI Anne Black is a 58 y.o. female.     HPI Patient is a 58 year old female who is well-known to myself in this department.  She presents the emergency department complaints of nausea and out of her Zofran.  She denies vomiting.  Denies fevers and chills.  No urinary complaints.  Denies diarrhea.  Reports crampy abdominal pain.  No focal abdominal pain.  No back or flank pain.  No chest pain or shortness of breath.  No fevers or chills or cough.  She is requesting a refill of her Zofran at this time.  She is currently without a primary care physician.  She requests medicine for anxiety.  She has had 55 visits to the emergency department the past 6 months.  She has a care plan.   Past Medical History:  Diagnosis Date  . ADHD (attention deficit hyperactivity disorder)   . Alcoholic cirrhosis (HCC)    with ascites  . Alcoholism (HCC)   . Allergies   . Anxiety   . Arthritis   . Atrial fibrillation with RVR (HCC)   . Bipolar disorder (HCC)   . COPD (chronic obstructive pulmonary disease) (HCC)    wears 2L chronic O2  . Dyspnea   . EtOH dependence (HCC) 05/14/2018  . GERD (gastroesophageal reflux disease)   . History of hiatal hernia   . Hypokalemia   . Migraine   . Multiple sclerosis (HCC)   . Narcolepsy   . Osteoarthritis of knee   . Osteoporosis   . Pneumonia   . UTI (urinary tract infection) 08/11/2018    Patient Active Problem List   Diagnosis Date Noted  . Anemia 08/16/2018  . Palliative care by specialist   . Cellulitis of lower extremity   . Peripheral edema   . Atrial fibrillation with RVR (HCC) 06/13/2018  . Alcohol dependence (HCC) 05/06/2018  . Electrolyte disturbance 05/05/2018  . Alcohol withdrawal delirium (HCC) 04/28/2018  . Weakness  generalized 04/27/2018  . Gait abnormality 04/24/2018  . Chronic hypoxemic respiratory failure (HCC) 04/11/2018  . Atrial fibrillation with rapid ventricular response (HCC) 03/23/2018  . Head injury   . Fall 03/19/2018  . Syncope 03/19/2018  . Orbital fracture 03/19/2018  . Scalp laceration 03/19/2018  . Acute respiratory failure with hypoxia and hypercarbia (HCC) 03/19/2018  . Abdominal tenderness 03/19/2018  . Acute on chronic respiratory failure with hypoxia (HCC) 03/17/2018  . Alcohol abuse with intoxication (HCC) 03/17/2018  . ETOH abuse   . Normocytic anemia 03/16/2018  . Acute metabolic encephalopathy 03/16/2018  . Lower extremity edema 03/16/2018  . Migraine 03/16/2018  . Community acquired pneumonia of left lower lobe of lung (HCC)   . Weakness 08/21/2017  . COPD exacerbation (HCC) 06/12/2017  . Chest pain with moderate risk for cardiac etiology 05/30/2017  . Abnormal LFTs 05/30/2017  . Tachycardia 05/30/2017  . COPD (chronic obstructive pulmonary disease) (HCC) 04/19/2017  . Right rib fracture 04/15/2017  . Abdominal wall cellulitis 12/30/2016  . S/P exploratory laparotomy 12/30/2016  . Distal radius fracture, left 09/09/2016  . Thrombocytopenia (HCC) 09/09/2016  . Chest x-ray abnormality   . Umbilical hernia without obstruction and without gangrene 05/12/2016  . Itching 02/24/2016  . Ventral hernia without obstruction or gangrene 02/24/2016  . Goals of care,  counseling/discussion   . Ascites   . Tachypnea   . Atrial flutter with rapid ventricular response (HCC)   . Hypomagnesemia   . Hypoxia   . Dehydration 01/22/2016  . Low back ache 12/30/2015  . Non-intractable vomiting with nausea   . Alcohol abuse with alcohol-induced mood disorder (HCC) 12/15/2015  . Acute alcoholism (HCC)   . Distended abdomen 11/04/2015  . Alcohol abuse   . Dyspnea   . Acute respiratory failure (HCC) 10/15/2015  . Ascites due to alcoholic cirrhosis (HCC) 10/15/2015  . Osteoporosis    . Multiple sclerosis (HCC)   . Alcoholic cirrhosis of liver with ascites (HCC) 08/22/2015  . Bipolar disorder, current episode mixed, moderate (HCC) 08/01/2015  . Alcohol use disorder, severe, dependence (HCC) 07/31/2015  . Macrocytic anemia- due to alcohol abuse with normal B12 & folate levels 05/31/2015  . Severe protein-calorie malnutrition (HCC) 05/31/2015  . Hypokalemia 05/26/2015  . Alcohol withdrawal (HCC) 05/18/2015  . Abdominal pain 05/18/2015  . Anxiety 05/18/2015  . Stimulant abuse (HCC) 12/27/2014  . Nicotine abuse 12/27/2014  . Hyperprolactinemia (HCC) 11/15/2014  . Dyslipidemia   . Alcohol use disorder, moderate, dependence (HCC) 10/30/2014  . Tobacco abuse 01/23/2014  . Bipolar 1 disorder (HCC) 04/07/2013  . Noncompliance with therapeutic plan 04/04/2013  . Hereditary and idiopathic peripheral neuropathy 05/01/2012  . Depression 05/01/2012  . GERD (gastroesophageal reflux disease) 05/01/2012  . Osteoarthrosis, unspecified whether generalized or localized, involving lower leg 05/01/2012  . Hyponatremia 07/01/2011    Past Surgical History:  Procedure Laterality Date  . ABDOMINAL WALL DEFECT REPAIR N/A 12/30/2016   Procedure: EXPLORATORY LAPAROTOMY WITH REPAIR ABDOMINAL WALL VENTRAL HERNIA;  Surgeon: Emelia LoronWakefield, Matthew, MD;  [facility];  Service: General;  Laterality: N/A;  . APPLICATION OF WOUND VAC N/A 12/30/2016   Procedure: APPLICATION OF WOUND VAC;  Surgeon: Emelia LoronWakefield, Matthew, MD;  [facility];  Service: General;  Laterality: N/A;  . CESAREAN SECTION  [phone],2000  . FRACTURE SURGERY    . HERNIA REPAIR    . IR PARACENTESIS  06/14/2018  . IR PARACENTESIS  08/14/2018  . IR PARACENTESIS  08/18/2018  . MYRINGOTOMY WITH TUBE PLACEMENT Bilateral   . ORIF WRIST FRACTURE Left 09/09/2016   Procedure: OPEN REDUCTION INTERNAL FIXATION (ORIF) LEFT WRIST FRACTURE, LEFT CARPAL TUNNEL RELEASE;  Surgeon: Dominica SeverinGramig, William, MD;  Location: [facility] ORS;  Service: Orthopedics;   Laterality: Left;  . TONSILLECTOMY       OB History   No obstetric history on file.      Home Medications    Prior to Admission medications   Medication Sig Start Date End Date Taking? Authorizing Provider  folic acid (FOLVITE) 1 MG tablet Take 1 tablet (1 mg total) by mouth daily. 08/21/18   Standley BrookingGoodrich, Daniel P, MD  furosemide (LASIX) 40 MG tablet Take 1 tablet (40 mg total) by mouth daily. 06/27/18   Sabas SousBero, Michael M, MD  gabapentin (NEURONTIN) 100 MG capsule Take 2 capsules (200 mg total) by mouth 3 (three) times daily for 30 days. 07/19/18 08/18/18  Fayrene Helperran, Bowie, PA-C  hydrOXYzine (ATARAX/VISTARIL) 25 MG tablet Take 1 tablet (25 mg total) by mouth daily as needed for up to 30 doses (anxiety). 04/14/18   Tegeler, Canary Brimhristopher J, MD  lactulose (CHRONULAC) 10 GM/15ML solution Take 30 mLs (20 g total) by mouth 2 (two) times daily. 08/20/18   Standley BrookingGoodrich, Daniel P, MD  LORazepam (ATIVAN) 0.5 MG tablet Take 1 tablet (0.5 mg total) by mouth every 12 (twelve) hours as  needed for anxiety. 08/20/18   Samuella Cota, MD  Multiple Vitamin (MULTIVITAMIN WITH MINERALS) TABS tablet Take 1 tablet by mouth daily. 08/21/18   Samuella Cota, MD  ondansetron (ZOFRAN ODT) 8 MG disintegrating tablet Take 1 tablet (8 mg total) by mouth every 8 (eight) hours as needed for nausea or vomiting. 09/05/18   Jola Schmidt, MD  ondansetron (ZOFRAN) 4 MG tablet Take 1 tablet (4 mg total) by mouth every 6 (six) hours. 08/22/18   Hayden Rasmussen, MD  pantoprazole (PROTONIX) 20 MG tablet Take 2 tablets (40 mg total) by mouth daily. 07/29/18   Mesner, Corene Cornea, MD  potassium chloride SA (K-DUR,KLOR-CON) 20 MEQ tablet Take 2 tablets (40 mEq total) by mouth 2 (two) times daily. 04/19/18   Recardo Evangelist, PA-C  spironolactone (ALDACTONE) 25 MG tablet Take 1 tablet (25 mg total) by mouth daily for 30 days. 06/27/18 08/10/18  Maudie Flakes, MD  thiamine 100 MG tablet Take 1 tablet (100 mg total) by mouth daily. 08/21/18   Samuella Cota, MD    Family History Family History  Problem Relation Age of Onset  . Arrhythmia Mother   . Neuropathy Mother   . Coronary artery disease Mother   . Heart disease Mother   . Heart disease Father   . Hypertension Father   . Heart attack Father   . Multiple sclerosis Maternal Aunt     Social History Social History   Tobacco Use  . Smoking status: Current Every Day Smoker    Packs/day: 1.00    Years: 36.00    Pack years: 36.00    Types: Cigarettes  . Smokeless tobacco: Never Used  Substance Use Topics  . Alcohol use: Yes    Comment: Chronic ETOH abuse  . Drug use: No     Allergies   Patient has no known allergies.   Review of Systems Review of Systems  All other systems reviewed and are negative.    Physical Exam Updated Vital Signs BP (!) 117/55 (BP Location: Left Arm)   Pulse 89   Temp 97.9 F (36.6 C) (Oral)   Resp 17   SpO2 95%   Physical Exam Vitals signs and nursing note reviewed.  Constitutional:      General: She is not in acute distress.    Appearance: She is well-developed.  HENT:     Head: Normocephalic and atraumatic.  Neck:     Musculoskeletal: Normal range of motion.  Cardiovascular:     Rate and Rhythm: Normal rate and regular rhythm.     Heart sounds: Normal heart sounds.  Pulmonary:     Effort: Pulmonary effort is normal.     Breath sounds: Normal breath sounds.  Abdominal:     General: There is no distension.     Palpations: Abdomen is soft.     Tenderness: There is no abdominal tenderness. There is no guarding or rebound.  Musculoskeletal: Normal range of motion.  Skin:    General: Skin is warm and dry.  Neurological:     Mental Status: She is alert and oriented to person, place, and time.  Psychiatric:        Judgment: Judgment normal.      ED Treatments / Results  Labs (all labs ordered are listed, but only abnormal results are displayed) Labs Reviewed - No data to display  EKG None  Radiology No  results found.  Procedures Procedures (including critical care time)  Medications Ordered in ED  Medications  ondansetron (ZOFRAN-ODT) disintegrating tablet 8 mg (has no administration in time range)     Initial Impression / Assessment and Plan / ED Course  I have reviewed the triage vital signs and the nursing notes.  Pertinent labs & imaging results that were available during my care of the patient were reviewed by me and considered in my medical decision making (see chart for details).        Reassuring abdominal exam.  Reassuring vital signs.  Overall well-appearing.  Appears to be baseline in my interactions with her.  I do not think she needs an aggressive work-up here in the emergency department.  She will be prescribed Zofran.  She has been given a list of low-cost clinics in the community.  No indication for additional work-up.  Medical screening examination complete.  Final Clinical Impressions(s) / ED Diagnoses   Final diagnoses:  Nausea    ED Discharge Orders         Ordered    ondansetron (ZOFRAN ODT) 8 MG disintegrating tablet  Every 8 hours PRN     09/05/18 0751           Azalia Bilis, MD 09/05/18 [id number]

## 2018-09-05 NOTE — ED Notes (Signed)
PT able to ambulate to restroom. PT refuse to have brief place on her due to incontinent

## 2018-09-05 NOTE — ED Triage Notes (Signed)
Patient c/o of abdominal pain, back, pain, and anxiety.    A/Ox4 Ambulatory with ems.

## 2018-09-07 ENCOUNTER — Observation Stay (HOSPITAL_COMMUNITY): Payer: Self-pay

## 2018-09-07 ENCOUNTER — Encounter (HOSPITAL_COMMUNITY): Payer: Self-pay

## 2018-09-07 ENCOUNTER — Observation Stay (HOSPITAL_COMMUNITY)
Admission: EM | Admit: 2018-09-07 | Discharge: 2018-09-07 | Discharge status: Against Medical Advice | Payer: Self-pay | Attending: Internal Medicine | Admitting: Internal Medicine

## 2018-09-07 ENCOUNTER — Emergency Department (HOSPITAL_COMMUNITY)
Admission: EM | Admit: 2018-09-07 | Discharge: 2018-09-07 | Disposition: A | Discharge status: Home / Self Care | Payer: Self-pay | Attending: Emergency Medicine | Admitting: Emergency Medicine

## 2018-09-07 ENCOUNTER — Other Ambulatory Visit: Payer: Self-pay

## 2018-09-07 DIAGNOSIS — K7031 Alcoholic cirrhosis of liver with ascites: Secondary | ICD-10-CM | POA: Insufficient documentation

## 2018-09-07 DIAGNOSIS — F1020 Alcohol dependence, uncomplicated: Secondary | ICD-10-CM | POA: Insufficient documentation

## 2018-09-07 DIAGNOSIS — K219 Gastro-esophageal reflux disease without esophagitis: Secondary | ICD-10-CM | POA: Insufficient documentation

## 2018-09-07 DIAGNOSIS — R140 Abdominal distension (gaseous): Secondary | ICD-10-CM

## 2018-09-07 DIAGNOSIS — Z5321 Procedure and treatment not carried out due to patient leaving prior to being seen by health care provider: Secondary | ICD-10-CM | POA: Insufficient documentation

## 2018-09-07 DIAGNOSIS — Z9981 Dependence on supplemental oxygen: Secondary | ICD-10-CM | POA: Insufficient documentation

## 2018-09-07 DIAGNOSIS — F10129 Alcohol abuse with intoxication, unspecified: Secondary | ICD-10-CM | POA: Diagnosis present

## 2018-09-07 DIAGNOSIS — F909 Attention-deficit hyperactivity disorder, unspecified type: Secondary | ICD-10-CM | POA: Insufficient documentation

## 2018-09-07 DIAGNOSIS — Z20828 Contact with and (suspected) exposure to other viral communicable diseases: Secondary | ICD-10-CM | POA: Insufficient documentation

## 2018-09-07 DIAGNOSIS — M1710 Unilateral primary osteoarthritis, unspecified knee: Secondary | ICD-10-CM | POA: Insufficient documentation

## 2018-09-07 DIAGNOSIS — E876 Hypokalemia: Secondary | ICD-10-CM | POA: Insufficient documentation

## 2018-09-07 DIAGNOSIS — D61818 Other pancytopenia: Secondary | ICD-10-CM | POA: Insufficient documentation

## 2018-09-07 DIAGNOSIS — F17210 Nicotine dependence, cigarettes, uncomplicated: Secondary | ICD-10-CM | POA: Insufficient documentation

## 2018-09-07 DIAGNOSIS — J449 Chronic obstructive pulmonary disease, unspecified: Secondary | ICD-10-CM | POA: Insufficient documentation

## 2018-09-07 DIAGNOSIS — Z9114 Patient's other noncompliance with medication regimen: Secondary | ICD-10-CM | POA: Insufficient documentation

## 2018-09-07 DIAGNOSIS — Z79899 Other long term (current) drug therapy: Secondary | ICD-10-CM | POA: Insufficient documentation

## 2018-09-07 DIAGNOSIS — I959 Hypotension, unspecified: Secondary | ICD-10-CM | POA: Insufficient documentation

## 2018-09-07 DIAGNOSIS — I4891 Unspecified atrial fibrillation: Secondary | ICD-10-CM | POA: Insufficient documentation

## 2018-09-07 DIAGNOSIS — F319 Bipolar disorder, unspecified: Secondary | ICD-10-CM | POA: Insufficient documentation

## 2018-09-07 DIAGNOSIS — R7989 Other specified abnormal findings of blood chemistry: Secondary | ICD-10-CM | POA: Diagnosis present

## 2018-09-07 DIAGNOSIS — R945 Abnormal results of liver function studies: Secondary | ICD-10-CM | POA: Diagnosis present

## 2018-09-07 DIAGNOSIS — R0602 Shortness of breath: Secondary | ICD-10-CM | POA: Insufficient documentation

## 2018-09-07 DIAGNOSIS — G35 Multiple sclerosis: Secondary | ICD-10-CM | POA: Insufficient documentation

## 2018-09-07 DIAGNOSIS — R21 Rash and other nonspecific skin eruption: Principal | ICD-10-CM | POA: Insufficient documentation

## 2018-09-07 DIAGNOSIS — R079 Chest pain, unspecified: Secondary | ICD-10-CM | POA: Insufficient documentation

## 2018-09-07 DIAGNOSIS — Z7141 Alcohol abuse counseling and surveillance of alcoholic: Secondary | ICD-10-CM | POA: Insufficient documentation

## 2018-09-07 DIAGNOSIS — M810 Age-related osteoporosis without current pathological fracture: Secondary | ICD-10-CM | POA: Insufficient documentation

## 2018-09-07 LAB — COMPREHENSIVE METABOLIC PANEL
ALT: 14 U/L (ref 0–44)
AST: 41 U/L (ref 15–41)
Albumin: 2.7 g/dL — ABNORMAL LOW (ref 3.5–5.0)
Alkaline Phosphatase: 130 U/L — ABNORMAL HIGH (ref 38–126)
Anion gap: 10 (ref 5–15)
BUN: <5 mg/dL — ABNORMAL LOW (ref 6–20)
CO2: 38 mmol/L — ABNORMAL HIGH (ref 22–32)
Calcium: 7.6 mg/dL — ABNORMAL LOW (ref 8.9–10.3)
Chloride: 88 mmol/L — ABNORMAL LOW (ref 98–111)
Creatinine, Ser: 0.48 mg/dL (ref 0.44–1.00)
GFR calc Af Amer: >60 mL/min (ref >60)
GFR calc non Af Amer: >60 mL/min (ref >60)
Glucose, Bld: 93 mg/dL (ref 70–99)
Potassium: 2.6 mmol/L — CL (ref 3.5–5.1)
Sodium: 136 mmol/L (ref 135–145)
Total Bilirubin: 3.5 mg/dL — ABNORMAL HIGH (ref 0.3–1.2)
Total Protein: 6.2 g/dL — ABNORMAL LOW (ref 6.5–8.1)

## 2018-09-07 LAB — CBC
HCT: 30.4 % — ABNORMAL LOW (ref 36.0–46.0)
Hemoglobin: 9.1 g/dL — ABNORMAL LOW (ref 12.0–15.0)
MCH: 26.8 pg (ref 26.0–34.0)
MCHC: 29.9 g/dL — ABNORMAL LOW (ref 30.0–36.0)
MCV: 89.4 fL (ref 80.0–100.0)
Platelets: 99 10*3/uL — ABNORMAL LOW (ref 150–400)
RBC: 3.4 MIL/uL — ABNORMAL LOW (ref 3.87–5.11)
RDW: 23.3 % — ABNORMAL HIGH (ref 11.5–15.5)
WBC: 2.9 10*3/uL — ABNORMAL LOW (ref 4.0–10.5)
nRBC: 0 % (ref 0.0–0.2)

## 2018-09-07 LAB — TROPONIN I (HIGH SENSITIVITY): Troponin I (High Sensitivity): 5 ng/L (ref <18)

## 2018-09-07 LAB — MAGNESIUM: Magnesium: 1.9 mg/dL (ref 1.7–2.4)

## 2018-09-07 LAB — SARS CORONAVIRUS 2 BY RT PCR (HOSPITAL ORDER, PERFORMED IN ~~LOC~~ HOSPITAL LAB): SARS Coronavirus 2: NEGATIVE

## 2018-09-07 LAB — ETHANOL: Alcohol, Ethyl (B): 201 mg/dL — ABNORMAL HIGH (ref <10)

## 2018-09-07 MED ORDER — GERHARDT'S BUTT CREAM: TOPICAL_CREAM | Freq: Three times a day (TID) | CUTANEOUS | Status: DC

## 2018-09-07 MED ORDER — SODIUM CHLORIDE 0.9 % IV SOLN
Freq: Once | INTRAVENOUS | Status: AC
  Administered 2018-09-07: 09:00:00 via INTRAVENOUS

## 2018-09-07 MED ORDER — FOLIC ACID 1 MG PO TABS
1.0000 mg | ORAL_TABLET | Freq: Every day | ORAL | Status: DC
  Administered 2018-09-07: 1 mg via ORAL
  Filled 2018-09-07: qty 1

## 2018-09-07 MED ORDER — VITAMIN B-1 100 MG PO TABS
100.0000 mg | ORAL_TABLET | Freq: Every day | ORAL | Status: DC
  Administered 2018-09-07: 100 mg via ORAL
  Filled 2018-09-07: qty 1

## 2018-09-07 MED ORDER — THIAMINE HCL 100 MG/ML IJ SOLN: 100.0000 mg | Freq: Every day | INTRAMUSCULAR | Status: DC

## 2018-09-07 MED ORDER — NYSTATIN 100000 UNIT/GM EX POWD
Freq: Once | CUTANEOUS | Status: AC
  Administered 2018-09-07: 1 via TOPICAL
  Filled 2018-09-07: qty 15

## 2018-09-07 MED ORDER — POTASSIUM CHLORIDE 10 MEQ/100ML IV SOLN
10.0000 meq | INTRAVENOUS | Status: AC
  Administered 2018-09-07 (×3): 10 meq via INTRAVENOUS
  Filled 2018-09-07 (×3): qty 100

## 2018-09-07 MED ORDER — POTASSIUM CHLORIDE CRYS ER 20 MEQ PO TBCR
40.0000 meq | EXTENDED_RELEASE_TABLET | Freq: Once | ORAL | Status: AC
  Administered 2018-09-07: 40 meq via ORAL
  Filled 2018-09-07: qty 2

## 2018-09-07 MED ORDER — SODIUM CHLORIDE 0.9 % IV BOLUS
1000.0000 mL | Freq: Once | INTRAVENOUS | Status: AC
  Administered 2018-09-07: 1000 mL via INTRAVENOUS

## 2018-09-07 MED ORDER — MAGNESIUM SULFATE 2 GM/50ML IV SOLN
2.0000 g | Freq: Once | INTRAVENOUS | Status: AC
  Administered 2018-09-07: 2 g via INTRAVENOUS
  Filled 2018-09-07: qty 50

## 2018-09-07 MED ORDER — ADULT MULTIVITAMIN W/MINERALS CH
1.0000 | ORAL_TABLET | Freq: Every day | ORAL | Status: DC
  Administered 2018-09-07: 1 via ORAL
  Filled 2018-09-07: qty 1

## 2018-09-07 MED ORDER — POTASSIUM CHLORIDE CRYS ER 20 MEQ PO TBCR: 40.0000 meq | EXTENDED_RELEASE_TABLET | Freq: Once | ORAL | Status: DC

## 2018-09-07 MED ORDER — LORAZEPAM 1 MG PO TABS
1.0000 mg | ORAL_TABLET | Freq: Four times a day (QID) | ORAL | Status: DC | PRN
  Administered 2018-09-07: 1 mg via ORAL
  Filled 2018-09-07: qty 1

## 2018-09-07 MED ORDER — LORAZEPAM 2 MG/ML IJ SOLN: 1.0000 mg | Freq: Four times a day (QID) | INTRAMUSCULAR | Status: DC | PRN

## 2018-09-07 NOTE — ED Notes (Addendum)
This RN entered room d/t patient screaming.  Pt found slouched down in bed, heels hanging off end of bed.  Pt screaming at this RN that she was not provided with call bell, that is was this RNs duty to ensure she had her call bell.  Call bell was located on edge of bed.  This RN handed pt call bell, asked if there was other help that she needed.  Pt now screaming that she was leaving, demanding to be given her clothing.  This RN informed pt that she would contact the admitting provider and allow them to come and speak with her.  Pt refused to shift up in the bed.  Call bell within reach of pt when this RN left room. Bed locked in lowest position, side rails up for safety. Admitting provider, Regalado, made aware of pts desire to leave. Will continue to monitor.

## 2018-09-07 NOTE — Progress Notes (Addendum)
IR requested by Dr. Tyrell Antonio for possible image-guided paracentesis.  Upon calling patient to unit for exam, patient refusing procedure because she wanted to eat. Attempted to explain to patient importance of procedure, and explained that we have multiple [place] procedures today and can only do her procedure now. Again, patient refusing procedure. Dr. Tyrell Antonio made aware, states patient is leaving AMA.  IR available in future if needed.   Bea Graff Louk, PA-C 09/07/2018, 1:49 PM

## 2018-09-07 NOTE — ED Notes (Addendum)
CSW at bedside. Call bell in reach, bed locked in lowest position.  Will continue to monitor.

## 2018-09-07 NOTE — ED Triage Notes (Signed)
Per EMS - Pt states she has a rash on her buttock, possibly from soiled unchanged brief.   110 80 80 HR  16 R 98% on 2L

## 2018-09-07 NOTE — ED Notes (Signed)
Date and time results received: 09/07/18 8:28 AM   Test: Potassium Critical Value: 2.6  Name of Provider Notified: Dr Sedonia Small  Orders Received? Or Actions Taken?: Orders Received - See Orders for details

## 2018-09-07 NOTE — ED Notes (Signed)
Despite being placed on bedpan, patient had bowel movement in bed. Patient assisted to bedside commode and full bed change completed.

## 2018-09-07 NOTE — ED Notes (Signed)
Purewick applied to pt, per her request.

## 2018-09-07 NOTE — ED Triage Notes (Signed)
Pt c.o SOB upon arrival to ED, oxygen 85% on room air. Pt states she always wears 3L [place], 3L applied at sats increased to 99%. During triage pt states she wanted to leave, pt then proceeded to urinate and defecate all over herself.

## 2018-09-07 NOTE — ED Notes (Addendum)
[place] came to take pt for her paracentesis. She refused the procedure at this time because "I want to eat, theyll just have to wait." This RN tried to inform her that staff are here to provided necessary medical care that the doctor ordered and she may not be able to have the procedure done later d/t [place] availability. Pt again refused to go. MD Regalado made aware.

## 2018-09-07 NOTE — H&P (Addendum)
History and Physical  Anne Black ZOX:[id number] DOB: 1960/10/19 DOA: 09/07/2018  PCP: Patient, No Pcp Per Patient coming from: Home   I have personally briefly [facility]   Chief Complaint: Rash buttock  HPI: Anne Black is a 58 y.o. female with past medical history significant for alcoholic cirrhosis, ADHD, A. fib, bipolar disorder, COPD on 2 L of oxygen, ethanol dependence, multiple sclerosis who present complaining of rash in her buttock for last week.  She use  dependent.  She lives at home by herself, she does not have any help.  Reports some shortness of breath and chest pain on exertion for the last 3 to 4 weeks.  She also reports worsening abdominal distention, worsening lower extremity edema.  She has not been taking her diuretic as she should.  She reports nausea, mild abdominal discomfort.  She denies fever.  She is alert and oriented x3.  She continued to drink alcohol.  She is willing to go to a skilled nursing facility.  Evaluation in the ED: Sodium 136, potassium 2.6, chloride 88, CO2 38 BUN 5 creatinine 0.4, alkaline phosphatase 130, total protein 6.2, white blood cell 2.9, hemoglobin 9.1, platelets 99, alcohol level 201. Chest x-ray:  no active disease.  -Patient received in the ED 10 mEq of IV KCl x3 run, oral potassium 40 mEq and 1 L of IV fluids.    Review of Systems: All systems reviewed and apart from history of presenting illness, are negative.  Past Medical History:  Diagnosis Date  . ADHD (attention deficit hyperactivity disorder)   . Alcoholic cirrhosis (HCC)    with ascites  . Alcoholism (HCC)   . Allergies   . Anxiety   . Arthritis   . Atrial fibrillation with RVR (HCC)   . Bipolar disorder (HCC)   . COPD (chronic obstructive pulmonary disease) (HCC)    wears 2L chronic O2  . Dyspnea   . EtOH dependence (HCC) 05/14/2018  . GERD (gastroesophageal reflux disease)   . History of hiatal hernia   .  Hypokalemia   . Migraine   . Multiple sclerosis (HCC)   . Narcolepsy   . Osteoarthritis of knee   . Osteoporosis   . Pneumonia   . UTI (urinary tract infection) 08/11/2018   Past Surgical History:  Procedure Laterality Date  . ABDOMINAL WALL DEFECT REPAIR N/A 12/30/2016   Procedure: EXPLORATORY LAPAROTOMY WITH REPAIR ABDOMINAL WALL VENTRAL HERNIA;  Surgeon: Emelia LoronWakefield, Matthew, MD;  [facility];  Service: General;  Laterality: N/A;  . APPLICATION OF WOUND VAC N/A 12/30/2016   Procedure: APPLICATION OF WOUND VAC;  Surgeon: Emelia LoronWakefield, Matthew, MD;  [facility];  Service: General;  Laterality: N/A;  . CESAREAN SECTION  [phone],2000  . FRACTURE SURGERY    . HERNIA REPAIR    . IR PARACENTESIS  06/14/2018  . IR PARACENTESIS  08/14/2018  . IR PARACENTESIS  08/18/2018  . MYRINGOTOMY WITH TUBE PLACEMENT Bilateral   . ORIF WRIST FRACTURE Left 09/09/2016   Procedure: OPEN REDUCTION INTERNAL FIXATION (ORIF) LEFT WRIST FRACTURE, LEFT CARPAL TUNNEL RELEASE;  Surgeon: Dominica SeverinGramig, William, MD;  Location: [facility] ORS;  Service: Orthopedics;  Laterality: Left;  . TONSILLECTOMY     Social History:  reports that she has been smoking cigarettes. She has a 36.00 pack-year smoking history. She has never used smokeless tobacco. She reports current alcohol use. She reports that she does not use drugs.   No Known Allergies  Family History  Problem Relation Age of Onset  . Arrhythmia Mother   . Neuropathy Mother   . Coronary artery disease Mother   . Heart disease Mother   . Heart disease Father   . Hypertension Father   . Heart attack Father   . Multiple sclerosis Maternal Aunt     Prior to Admission medications   Medication Sig Start Date End Date Taking? Authorizing Provider  folic acid (FOLVITE) 1 MG tablet Take 1 tablet (1 mg total) by mouth daily. 08/21/18   Standley BrookingGoodrich, Daniel P, MD  furosemide (LASIX) 40 MG tablet Take 1 tablet (40 mg total) by mouth daily. 06/27/18   Sabas SousBero, Michael M, MD  gabapentin  (NEURONTIN) 100 MG capsule Take 2 capsules (200 mg total) by mouth 3 (three) times daily for 30 days. 07/19/18 08/18/18  Fayrene Helperran, Bowie, PA-C  hydrOXYzine (ATARAX/VISTARIL) 25 MG tablet Take 1 tablet (25 mg total) by mouth daily as needed for up to 30 doses (anxiety). 04/14/18   Tegeler, Canary Brimhristopher J, MD  lactulose (CHRONULAC) 10 GM/15ML solution Take 30 mLs (20 g total) by mouth 2 (two) times daily. 08/20/18   Standley BrookingGoodrich, Daniel P, MD  LORazepam (ATIVAN) 0.5 MG tablet Take 1 tablet (0.5 mg total) by mouth every 12 (twelve) hours as needed for anxiety. 08/20/18   Standley BrookingGoodrich, Daniel P, MD  Multiple Vitamin (MULTIVITAMIN WITH MINERALS) TABS tablet Take 1 tablet by mouth daily. 08/21/18   Standley BrookingGoodrich, Daniel P, MD  ondansetron (ZOFRAN ODT) 8 MG disintegrating tablet Take 1 tablet (8 mg total) by mouth every 8 (eight) hours as needed for nausea or vomiting. 09/05/18   Azalia Bilisampos, Kevin, MD  ondansetron (ZOFRAN) 4 MG tablet Take 1 tablet (4 mg total) by mouth every 6 (six) hours. 08/22/18   Terrilee FilesButler, Michael C, MD  pantoprazole (PROTONIX) 20 MG tablet Take 2 tablets (40 mg total) by mouth daily. 07/29/18   Mesner, Barbara CowerJason, MD  potassium chloride SA (K-DUR,KLOR-CON) 20 MEQ tablet Take 2 tablets (40 mEq total) by mouth 2 (two) times daily. 04/19/18   Bethel BornGekas, Kelly Marie, PA-C  spironolactone (ALDACTONE) 25 MG tablet Take 1 tablet (25 mg total) by mouth daily for 30 days. 06/27/18 08/10/18  Sabas SousBero, Michael M, MD  thiamine 100 MG tablet Take 1 tablet (100 mg total) by mouth daily. 08/21/18   Standley BrookingGoodrich, Daniel P, MD   Physical Exam: Vitals:   09/07/18 0645 09/07/18 0649 09/07/18 0810  BP:  (!) 96/47 122/65  Pulse:  88 87  Resp:  16 16  Temp:  98.6 F (37 C)   TempSrc:  Oral   SpO2: 98% 92% 98%     General exam: Moderately built and nourished patient, lying comfortably supine on the gurney in no obvious distress.  Head, eyes and ENT: Nontraumatic and normocephalic. Pupils equally reacting to light and accommodation. Oral mucosa moist.   Neck: Supple. No JVD, carotid bruit or thyromegaly.  Lymphatics: No lymphadenopathy.  Respiratory system: Clear to auscultation. No increased work of breathing.  Cardiovascular system: S1 and S2 heard, RRR. No JVD, murmurs, gallops.  +2 edema  Gastrointestinal system: Abdomen is distended, mild redness soft and nontender. Normal bowel sounds heard. No organomegaly or masses appreciated.  Central nervous system: Alert and oriented. No focal neurological deficits.  Extremities: Symmetric 5 x 5 power. Peripheral pulses symmetrically felt.   Skin: No rashes or acute findings.  Musculoskeletal system: Negative exam.  Psychiatry: Pleasant and cooperative.   Labs on Admission:  Basic Metabolic Panel: Recent Labs  Lab 09/07/18 0738  NA 136  K 2.6*  CL 88*  CO2 38*  GLUCOSE 93  BUN <5*  CREATININE 0.48  CALCIUM 7.6*   Liver Function Tests: Recent Labs  Lab 09/07/18 0738  AST 41  ALT 14  ALKPHOS 130*  BILITOT 3.5*  PROT 6.2*  ALBUMIN 2.7*   No results for input(s): LIPASE, AMYLASE in the last 168 hours. No results for input(s): AMMONIA in the last 168 hours. CBC: Recent Labs  Lab 09/07/18 0738  WBC 2.9*  HGB 9.1*  HCT 30.4*  MCV 89.4  PLT 99*   Cardiac Enzymes: No results for input(s): CKTOTAL, CKMB, CKMBINDEX, TROPONINI in the last 168 hours.  BNP (last 3 results) No results for input(s): PROBNP in the last 8760 hours. CBG: No results for input(s): GLUCAP in the last 168 hours.  Radiological Exams on Admission: No results found.  EKG:   Assessment/Plan Active Problems:   Bipolar 1 disorder (HCC)   Hypokalemia   Alcohol use disorder, severe, dependence (HCC)   Ascites due to alcoholic cirrhosis (HCC)   Abnormal LFTs   A-fib (HCC)   Alcohol abuse with intoxication ([place])   ADHD (attention deficit hyperactivity disorder)  1-Hypokalemia;  Replete IV and orally.  Will give extra dose this afternoon.  Check Mg level. Replete as needed.     2-Alcoholic cirrhosis, ascites; Will order paracentesis. Hold diuretic until correction of hypokalemia, and blood pressure is stable. Resume lactulose.  3-pancytopenia: Monitor.  Likely related to alcohol use.  4-alcohol use: Counseling provided to patient. She wants to go to skilled nursing facility. Will order CIWA protocol.  5-History of A fib; unclear if paroxysmal or permanent.  Not candidate for anticoagulation due to current alcohol use, history of varices, cirrhosis.  Not on rate controlled medications.   6-Hypotension; mild SP on admission in the high 90. Received IV fluids.  Hold on further IV fluids for now to avoid further volume overload.   7-Dyspnea on exertion, Chest  pain on exertion;  Check troponin, EKG, ECHO.  Paracentesis might help with symptoms.   8-Covid 19 screening pending: Denies symptoms. Prior sick contact, her ex-husband was diagnosed with COVID couple of weeks ago.  She was tested and she was negative. -I use  regular mask, face shield, gloves.    9-buttock rash;  Cream barrier ordered.   DVT Prophylaxis: SCD Code Status: Full code Family Communication: Care discussed with patient. Disposition Plan: admit under observation. SW consulted , patient might need placement.   Time spent: 75 minutes.   Elmarie Shiley MD [facility]   09/07/2018, 9:22 AM

## 2018-09-07 NOTE — ED Notes (Signed)
Patient placed on bedpan.

## 2018-09-07 NOTE — ED Notes (Signed)
Pt assisted to change into hospital gown.  Able to roll with minimal assistance.

## 2018-09-07 NOTE — ED Notes (Signed)
Pt demanding to leave.  Pt demanding assistance with getting dressed, states she feels weak.  This RN informed her that it is her decision to leave AMA.  Pt assisted with dressing, wheeled to ED entrance and provided with a list of cab phone numbers.

## 2018-09-07 NOTE — Progress Notes (Signed)
CSW spoke with patient via bedside at request of the RN. RN reports patient wanting to leave AMA. Patient reports she recently lost parental rights to her twin daughters. She reports she missed the hearing because her husband gave her the wrong date for the hearing and then brought a paper to her with the decision. Patient was focused on getting out of the hospital to appeal the decision. Patient requested she wanted her daughters to come visit. CSW explained that there can not be visitors at this time due to [place]. CSW encourage patient to stay and get the medical attention she needs.   Radiology then came to see patient for her paracentesis procedure and patient refused to go stating she wanted to eat. Patient also tried to continue to speak with CSW about her daughters disregarding the radiologist. Frederic and RN assisted with encouraging patient to do the procedure. Patient again refused stating she is going to eat.   No further social work needs noted. CSW signing off.   [place] Circle, LCSW Transitions of Care Department [facility] [phone]

## 2018-09-07 NOTE — ED Notes (Signed)
Admitting provider at bedside.

## 2018-09-07 NOTE — ED Provider Notes (Signed)
[facility] Emergency Department Provider Note MRN:  [id number]  Arrival date & time: 09/07/18     Chief Complaint   Rash   History of Present Illness   Anne Black is a 58 y.o. year-old female with a history of alcohol abuse, A. fib, COPD presenting to the ED with chief complaint of rash.  Painful rash to the buttocks for the past 2 or 3 days.  Patient is feeling general malaise, fatigue, nausea but no vomiting, denies chest pain or shortness of breath, no abdominal pain.  Denies fever.  Pain is constant, worse with motion or palpation, no other exacerbating or relieving factors.  Review of Systems  A complete 10 system review of systems was obtained and all systems are negative except as noted in the HPI and PMH.   Patient's Health History    Past Medical History:  Diagnosis Date  . ADHD (attention deficit hyperactivity disorder)   . Alcoholic cirrhosis ([place])    with ascites  . Alcoholism ([place])   . Allergies   . Anxiety   . Arthritis   . Atrial fibrillation with RVR ([place])   . Bipolar disorder ([place])   . COPD (chronic obstructive pulmonary disease) (HCC)    wears 2L chronic O2  . Dyspnea   . EtOH dependence ([place]) 05/14/2018  . GERD (gastroesophageal reflux disease)   . History of hiatal hernia   . Hypokalemia   . Migraine   . Multiple sclerosis ([place])   . Narcolepsy   . Osteoarthritis of knee   . Osteoporosis   . Pneumonia   . UTI (urinary tract infection) 08/11/2018    Past Surgical History:  Procedure Laterality Date  . ABDOMINAL WALL DEFECT REPAIR N/A 12/30/2016   Procedure: EXPLORATORY LAPAROTOMY WITH REPAIR ABDOMINAL WALL VENTRAL HERNIA;  Surgeon: Rolm Bookbinder, MD;  Location: [facility];  Service: General;  Laterality: N/A;  . APPLICATION OF WOUND VAC N/A 12/30/2016   Procedure: APPLICATION OF WOUND VAC;  Surgeon: Rolm Bookbinder, MD;  Location: [facility];  Service: General;  Laterality: N/A;  . CESAREAN SECTION  [phone]  . FRACTURE  SURGERY    . HERNIA REPAIR    . IR PARACENTESIS  06/14/2018  . IR PARACENTESIS  08/14/2018  . IR PARACENTESIS  08/18/2018  . MYRINGOTOMY WITH TUBE PLACEMENT Bilateral   . ORIF WRIST FRACTURE Left 09/09/2016   Procedure: OPEN REDUCTION INTERNAL FIXATION (ORIF) LEFT WRIST FRACTURE, LEFT CARPAL TUNNEL RELEASE;  Surgeon: Roseanne Kaufman, MD;  Location: [facility];  Service: Orthopedics;  Laterality: Left;  . TONSILLECTOMY      Family History  Problem Relation Age of Onset  . Arrhythmia Mother   . Neuropathy Mother   . Coronary artery disease Mother   . Heart disease Mother   . Heart disease Father   . Hypertension Father   . Heart attack Father   . Multiple sclerosis Maternal Aunt     Social History   Socioeconomic History  . Marital status: Divorced    Spouse name: n/a  . Number of children: 5  . Years of education: College  . Highest education level: Not on file  Occupational History  . Occupation: disabled    Comment: on social security disability for [employer]  . Financial resource strain: Somewhat hard  . Food insecurity    Worry: Never true    Inability: Never true  . Transportation needs    Medical: No    Non-medical: No  Tobacco Use  .  Smoking status: Current Every Day Smoker    Packs/day: 1.00    Years: 36.00    Pack years: 36.00    Types: Cigarettes  . Smokeless tobacco: Never Used  Substance and Sexual Activity  . Alcohol use: Yes    Comment: Chronic ETOH abuse  . Drug use: No  . Sexual activity: Not Currently  Lifestyle  . Physical activity    Days per week: 0 days    Minutes per session: 0 min  . Stress: Rather much  Relationships  . Social connections    Talks on phone: More than three times a week    Gets together: Three times a week    Attends religious service: Never    Active member of club or organization: No    Attends meetings of clubs or organizations: Never    Relationship status: Divorced  . Intimate partner violence    Fear of current  or ex partner: Patient refused    Emotionally abused: Patient refused    Physically abused: Patient refused    Forced sexual activity: Patient refused  Other Topics Concern  . Not on file  Social History Narrative   Former Environmental consultant.   Started graduate school in counseling, but didn't finish when a good job came open.   Lives alone.   One daughter lives in [place].   Another daughter is in [place]    The other three live in [place], along with her mother.   Father lives in the [place], [place] area.     Physical Exam  Vital Signs and Nursing Notes reviewed Vitals:   09/07/18 1230 09/07/18 1300  BP: (!) 105/58 109/62  Pulse: 87 87  Resp: 12 19  Temp:    SpO2: 100% 100%    CONSTITUTIONAL: Chronically ill-appearing, NAD NEURO:  Alert and oriented x 3, no focal deficits EYES:  eyes equal and reactive ENT/NECK:  no LAD, no JVD CARDIO: Regular rate, well-perfused, normal S1 and S2 PULM:  CTAB no wheezing or rhonchi GI/GU:  normal bowel sounds, non-distended, non-tender MSK/SPINE:  No gross deformities, no edema SKIN:  no rash, erythematous rash to the bilateral buttocks and perineal region [facility]:  Appropriate speech and behavior  Diagnostic and Interventional Summary    Labs Reviewed  CBC - Abnormal; Notable for the following components:      Result Value   WBC 2.9 (*)    RBC 3.40 (*)    Hemoglobin 9.1 (*)    HCT 30.4 (*)    MCHC 29.9 (*)    RDW 23.3 (*)    Platelets 99 (*)    All other components within normal limits  COMPREHENSIVE METABOLIC PANEL - Abnormal; Notable for the following components:   Potassium 2.6 (*)    Chloride 88 (*)    CO2 38 (*)    BUN <5 (*)    Calcium 7.6 (*)    Total Protein 6.2 (*)    Albumin 2.7 (*)    Alkaline Phosphatase 130 (*)    Total Bilirubin 3.5 (*)    All other components within normal limits  ETHANOL - Abnormal; Notable for the following components:   Alcohol, Ethyl (B) 201 (*)    All other components  within normal limits  SARS CORONAVIRUS 2 (HOSPITAL ORDER, PERFORMED IN [facility])  MAGNESIUM  TROPONIN I (HIGH SENSITIVITY)  TROPONIN I (HIGH SENSITIVITY)    US Paracentesis    (Results Pending)    Medications  potassium chloride SA (K-DUR)  CR tablet 40 mEq (has no administration in time range)  LORazepam (ATIVAN) tablet 1 mg (1 mg Oral Given 09/07/18 1316)    Or  LORazepam (ATIVAN) injection 1 mg ( Intravenous See Alternative 09/07/18 1316)  thiamine (VITAMIN B-1) tablet 100 mg (100 mg Oral Given 09/07/18 1010)    Or  thiamine (B-1) injection 100 mg ( Intravenous See Alternative 09/07/18 1010)  folic acid (FOLVITE) tablet 1 mg (1 mg Oral Given 09/07/18 1011)  multivitamin with minerals tablet 1 tablet (1 tablet Oral Given 09/07/18 1011)  Gerhardt's butt cream ( Topical Not Given 09/07/18 1237)  sodium chloride 0.9 % bolus 1,000 mL (0 mLs Intravenous Stopped 09/07/18 0856)  potassium chloride SA (K-DUR) CR tablet 40 mEq (40 mEq Oral Given 09/07/18 0852)  potassium chloride 10 mEq in 100 mL IVPB (0 mEq Intravenous Stopped 09/07/18 1211)  nystatin (MYCOSTATIN/NYSTOP) topical powder (1 Bottle Topical Given 09/07/18 0925)  0.9 %  sodium chloride infusion ( Intravenous Stopped 09/07/18 1240)  magnesium sulfate IVPB 2 g 50 mL ( Intravenous Stopped 09/07/18 1118)     Procedures Critical Care Critical Care Documentation Critical care time provided by me (excluding procedures): 33 minutes  Condition necessitating critical care: Hypokalemia  Components of critical care management: reviewing of prior records, laboratory and imaging interpretation, frequent re-examination and reassessment of vital signs, administration of IV potassium, discussion with consulting services    ED Course and Medical Decision Making  I have reviewed the triage vital signs and the nursing notes.  Pertinent labs & imaging results that were available during my care of the patient were reviewed by me and  considered in my medical decision making (see below for details).  Chronically ill-appearing alcoholic here with hypotension, rash, abdominal distention with peripheral edema.  Favoring dehydration in the setting of alcohol intoxication, fluids, labs pending. Chaperone to evaluate rash.  Rashes consistent with candidal rash related to depend use.  Patient's labs reveal profound hypokalemia, admitted to hospital service for further care.  Elmer SowMichael M. Pilar PlateBero, MD [facility] [email or web address]  Final Clinical Impressions(s) / ED Diagnoses     ICD-10-CM   1. Hypokalemia  E87.6   2. Abdominal distension  R14.0 US Paracentesis    US Paracentesis    CANCELED: IR Paracentesis    CANCELED: IR Paracentesis    CANCELED: US Paracentesis    CANCELED: US Paracentesis  3. Rash  R21     ED Discharge Orders    None         Sabas SousBero, Ziaire Hagos M, MD 09/07/18 [phone]

## 2018-09-07 NOTE — CV Procedure (Signed)
Patient is refusing her echo, patient states that she is worried about the cost of her stay and neccessary tests. I attempted to emphasize how heart function can be related to her symptoms, but patient still refused her exam. Her nurse is aware of patients decision.  Darlina Sicilian RDCS

## 2018-09-07 NOTE — TOC Initial Note (Signed)
Transition of Care [facility]) - Initial/Assessment Note    Patient Details  Name: Anne Black MRN: [id number] Date of Birth: 1961-01-12  Transition of Care [facility]) CM/SW Contact:    Elliot Cousin, RN Phone Number: [phone] 09/07/2018, 3:04 PM  Clinical Narrative:                 Pt states she is signing out AMA. TOC CM spoke to pt about importance of medical treatment. States she has legal issues at home that need to be taken care of.   Expected Discharge Plan: Home/Self Care Barriers to Discharge: Patient left Against Medical Advice [facility])   Patient Goals and CMS Choice        Expected Discharge Plan and Services Expected Discharge Plan: Home/Self Care         Expected Discharge Date: (unknown)                                    Prior Living Arrangements/Services                       Activities of Daily Living Home Assistive Devices/Equipment: Cane (specify quad or straight), Walker (specify type), Shower chair without back, Wheelchair, Other (Comment)(walk-in shower, front wheeled walker, single point cane) ADL Screening (condition at time of admission) Patient's cognitive ability adequate to safely complete daily activities?: Yes Is the patient deaf or have difficulty hearing?: No Does the patient have difficulty seeing, even when wearing glasses/contacts?: No Does the patient have difficulty concentrating, remembering, or making decisions?: No Patient able to express need for assistance with ADLs?: Yes Does the patient have difficulty dressing or bathing?: Yes Independently performs ADLs?: No Communication: Independent Dressing (OT): Independent Grooming: Independent Feeding: Independent Bathing: Needs assistance Is this a change from baseline?: Pre-admission baseline Toileting: Needs assistance Is this a change from baseline?: Pre-admission baseline In/Out Bed: Needs assistance Is this a change from baseline?: Pre-admission  baseline Walks in Home: Needs assistance Is this a change from baseline?: Pre-admission baseline Does the patient have difficulty walking or climbing stairs?: Yes Weakness of Legs: Both Weakness of Arms/Hands: None  Permission Sought/Granted                  Emotional Assessment              Admission diagnosis:  rash Patient Active Problem List   Diagnosis Date Noted  . ADHD (attention deficit hyperactivity disorder)   . Anemia 08/16/2018  . Palliative care by specialist   . Cellulitis of lower extremity   . Peripheral edema   . Atrial fibrillation with RVR (HCC) 06/13/2018  . Alcohol dependence (HCC) 05/06/2018  . Electrolyte disturbance 05/05/2018  . Alcohol withdrawal delirium (HCC) 04/28/2018  . Weakness generalized 04/27/2018  . Gait abnormality 04/24/2018  . Chronic hypoxemic respiratory failure (HCC) 04/11/2018  . Atrial fibrillation with rapid ventricular response (HCC) 03/23/2018  . Head injury   . Fall 03/19/2018  . Syncope 03/19/2018  . Orbital fracture 03/19/2018  . Scalp laceration 03/19/2018  . Acute respiratory failure with hypoxia and hypercarbia (HCC) 03/19/2018  . Abdominal tenderness 03/19/2018  . Acute on chronic respiratory failure with hypoxia (HCC) 03/17/2018  . Alcohol abuse with intoxication (HCC) 03/17/2018  . ETOH abuse   . Normocytic anemia 03/16/2018  . Acute metabolic encephalopathy 03/16/2018  . Lower extremity edema 03/16/2018  . Migraine 03/16/2018  .  A-fib ([place]) 02/18/2018  . Community acquired pneumonia of left lower lobe of lung ([place])   . Weakness 08/21/2017  . COPD exacerbation ([place]) 06/12/2017  . Chest pain with moderate risk for cardiac etiology 05/30/2017  . Abnormal LFTs 05/30/2017  . Tachycardia 05/30/2017  . COPD (chronic obstructive pulmonary disease) ([place]) 04/19/2017  . Right rib fracture 04/15/2017  . Abdominal wall cellulitis 12/30/2016  . S/P exploratory laparotomy 12/30/2016  . Distal radius fracture,  left 09/09/2016  . Thrombocytopenia ([place]) 09/09/2016  . Chest x-ray abnormality   . Umbilical hernia without obstruction and without gangrene 05/12/2016  . Itching 02/24/2016  . Ventral hernia without obstruction or gangrene 02/24/2016  . Goals of care, counseling/discussion   . Ascites   . Tachypnea   . Atrial flutter with rapid ventricular response ([place])   . Hypomagnesemia   . Hypoxia   . Dehydration 01/22/2016  . Low back ache 12/30/2015  . Non-intractable vomiting with nausea   . Alcohol abuse with alcohol-induced mood disorder ([place]) 12/15/2015  . Acute alcoholism ([place])   . Distended abdomen 11/04/2015  . Alcohol abuse   . Dyspnea   . Acute respiratory failure ([place]) 10/15/2015  . Ascites due to alcoholic cirrhosis ([place]) 16/11/9602  . Osteoporosis   . Multiple sclerosis ([place])   . Alcoholic cirrhosis of liver with ascites ([place]) 08/22/2015  . Bipolar disorder, current episode mixed, moderate ([place]) 08/01/2015  . Alcohol use disorder, severe, dependence ([place]) 07/31/2015  . Macrocytic anemia- due to alcohol abuse with normal B12 & folate levels 05/31/2015  . Severe protein-calorie malnutrition ([place]) 05/31/2015  . Hypokalemia 05/26/2015  . Alcohol withdrawal ([place]) 05/18/2015  . Abdominal pain 05/18/2015  . Anxiety 05/18/2015  . Stimulant abuse ([place]) 12/27/2014  . Nicotine abuse 12/27/2014  . Hyperprolactinemia ([place]) 11/15/2014  . Dyslipidemia   . Alcohol use disorder, moderate, dependence ([place]) 10/30/2014  . Tobacco abuse 01/23/2014  . Bipolar 1 disorder ([place]) 04/07/2013  . Noncompliance with therapeutic plan 04/04/2013  . Hereditary and idiopathic peripheral neuropathy 05/01/2012  . Depression 05/01/2012  . GERD (gastroesophageal reflux disease) 05/01/2012  . Osteoarthrosis, unspecified whether generalized or localized, involving lower leg 05/01/2012  . Hyponatremia 07/01/2011   PCP:  Patient, No Pcp Per Pharmacy:   [facility] [place], [place] - [id number] [place] [id number] [place] [address] Phone: [phone] Fax: [phone]     Social Determinants of Health (SDOH) Interventions    Readmission Risk Interventions No flowsheet data found.

## 2018-09-07 NOTE — ED Notes (Signed)
Pt again stating that she wanted to leave, demanding assistance to get dressed.  MD Regalado made aware.

## 2018-09-07 NOTE — ED Notes (Signed)
Pt left AMA. Pt paid for a taxi home.

## 2018-09-08 ENCOUNTER — Inpatient Hospital Stay (HOSPITAL_COMMUNITY)
Admission: EM | Admit: 2018-09-08 | Discharge: 2018-09-13 | DRG: 432 | Disposition: A | Discharge status: Skilled Nursing Facility | Payer: Medicare Other | Attending: Internal Medicine | Admitting: Internal Medicine

## 2018-09-08 ENCOUNTER — Encounter (HOSPITAL_COMMUNITY): Payer: Self-pay

## 2018-09-08 ENCOUNTER — Other Ambulatory Visit: Payer: Self-pay

## 2018-09-08 DIAGNOSIS — Z9981 Dependence on supplemental oxygen: Secondary | ICD-10-CM

## 2018-09-08 DIAGNOSIS — E7220 Disorder of urea cycle metabolism, unspecified: Secondary | ICD-10-CM | POA: Diagnosis present

## 2018-09-08 DIAGNOSIS — R188 Other ascites: Secondary | ICD-10-CM

## 2018-09-08 DIAGNOSIS — R109 Unspecified abdominal pain: Secondary | ICD-10-CM | POA: Diagnosis present

## 2018-09-08 DIAGNOSIS — K219 Gastro-esophageal reflux disease without esophagitis: Secondary | ICD-10-CM | POA: Diagnosis present

## 2018-09-08 DIAGNOSIS — K7031 Alcoholic cirrhosis of liver with ascites: Secondary | ICD-10-CM | POA: Diagnosis not present

## 2018-09-08 DIAGNOSIS — Z6830 Body mass index (BMI) 30.0-30.9, adult: Secondary | ICD-10-CM

## 2018-09-08 DIAGNOSIS — K852 Alcohol induced acute pancreatitis without necrosis or infection: Secondary | ICD-10-CM | POA: Diagnosis present

## 2018-09-08 DIAGNOSIS — R140 Abdominal distension (gaseous): Secondary | ICD-10-CM

## 2018-09-08 DIAGNOSIS — E871 Hypo-osmolality and hyponatremia: Secondary | ICD-10-CM | POA: Diagnosis present

## 2018-09-08 DIAGNOSIS — F32A Depression, unspecified: Secondary | ICD-10-CM | POA: Diagnosis present

## 2018-09-08 DIAGNOSIS — Y906 Blood alcohol level of 120-199 mg/100 ml: Secondary | ICD-10-CM | POA: Diagnosis present

## 2018-09-08 DIAGNOSIS — D61818 Other pancytopenia: Secondary | ICD-10-CM | POA: Diagnosis present

## 2018-09-08 DIAGNOSIS — G609 Hereditary and idiopathic neuropathy, unspecified: Secondary | ICD-10-CM | POA: Diagnosis present

## 2018-09-08 DIAGNOSIS — J9611 Chronic respiratory failure with hypoxia: Secondary | ICD-10-CM | POA: Diagnosis present

## 2018-09-08 DIAGNOSIS — Z720 Tobacco use: Secondary | ICD-10-CM | POA: Diagnosis present

## 2018-09-08 DIAGNOSIS — F10229 Alcohol dependence with intoxication, unspecified: Secondary | ICD-10-CM | POA: Diagnosis present

## 2018-09-08 DIAGNOSIS — J449 Chronic obstructive pulmonary disease, unspecified: Secondary | ICD-10-CM | POA: Diagnosis present

## 2018-09-08 DIAGNOSIS — Z20828 Contact with and (suspected) exposure to other viral communicable diseases: Secondary | ICD-10-CM | POA: Diagnosis present

## 2018-09-08 DIAGNOSIS — F17210 Nicotine dependence, cigarettes, uncomplicated: Secondary | ICD-10-CM | POA: Diagnosis present

## 2018-09-08 DIAGNOSIS — S30811A Abrasion of abdominal wall, initial encounter: Secondary | ICD-10-CM | POA: Diagnosis present

## 2018-09-08 DIAGNOSIS — K7040 Alcoholic hepatic failure without coma: Secondary | ICD-10-CM | POA: Diagnosis present

## 2018-09-08 DIAGNOSIS — E43 Unspecified severe protein-calorie malnutrition: Secondary | ICD-10-CM | POA: Diagnosis present

## 2018-09-08 DIAGNOSIS — D638 Anemia in other chronic diseases classified elsewhere: Secondary | ICD-10-CM | POA: Diagnosis present

## 2018-09-08 DIAGNOSIS — F1020 Alcohol dependence, uncomplicated: Secondary | ICD-10-CM | POA: Diagnosis present

## 2018-09-08 DIAGNOSIS — G35 Multiple sclerosis: Secondary | ICD-10-CM | POA: Diagnosis present

## 2018-09-08 DIAGNOSIS — R1084 Generalized abdominal pain: Secondary | ICD-10-CM | POA: Diagnosis not present

## 2018-09-08 DIAGNOSIS — F329 Major depressive disorder, single episode, unspecified: Secondary | ICD-10-CM | POA: Diagnosis present

## 2018-09-08 DIAGNOSIS — X58XXXA Exposure to other specified factors, initial encounter: Secondary | ICD-10-CM | POA: Diagnosis present

## 2018-09-08 DIAGNOSIS — M810 Age-related osteoporosis without current pathological fracture: Secondary | ICD-10-CM | POA: Diagnosis present

## 2018-09-08 DIAGNOSIS — I8510 Secondary esophageal varices without bleeding: Secondary | ICD-10-CM | POA: Diagnosis present

## 2018-09-08 DIAGNOSIS — F419 Anxiety disorder, unspecified: Secondary | ICD-10-CM | POA: Diagnosis present

## 2018-09-08 DIAGNOSIS — E876 Hypokalemia: Secondary | ICD-10-CM | POA: Diagnosis present

## 2018-09-08 DIAGNOSIS — F10920 Alcohol use, unspecified with intoxication, uncomplicated: Secondary | ICD-10-CM

## 2018-09-08 LAB — COMPREHENSIVE METABOLIC PANEL
ALT: 15 U/L (ref 0–44)
AST: 37 U/L (ref 15–41)
Albumin: 2.6 g/dL — ABNORMAL LOW (ref 3.5–5.0)
Alkaline Phosphatase: 124 U/L (ref 38–126)
Anion gap: 8 (ref 5–15)
BUN: <5 mg/dL — ABNORMAL LOW (ref 6–20)
CO2: 37 mmol/L — ABNORMAL HIGH (ref 22–32)
Calcium: 7.5 mg/dL — ABNORMAL LOW (ref 8.9–10.3)
Chloride: 89 mmol/L — ABNORMAL LOW (ref 98–111)
Creatinine, Ser: 0.37 mg/dL — ABNORMAL LOW (ref 0.44–1.00)
GFR calc Af Amer: >60 mL/min (ref >60)
GFR calc non Af Amer: >60 mL/min (ref >60)
Glucose, Bld: 97 mg/dL (ref 70–99)
Potassium: 2.7 mmol/L — CL (ref 3.5–5.1)
Sodium: 134 mmol/L — ABNORMAL LOW (ref 135–145)
Total Bilirubin: 3.1 mg/dL — ABNORMAL HIGH (ref 0.3–1.2)
Total Protein: 6 g/dL — ABNORMAL LOW (ref 6.5–8.1)

## 2018-09-08 LAB — RAPID URINE DRUG SCREEN, HOSP PERFORMED
Amphetamines: NOT DETECTED
Barbiturates: NOT DETECTED
Benzodiazepines: POSITIVE — AB
Cocaine: NOT DETECTED
Opiates: NOT DETECTED
Tetrahydrocannabinol: NOT DETECTED

## 2018-09-08 LAB — ETHANOL: Alcohol, Ethyl (B): 163 mg/dL — ABNORMAL HIGH (ref <10)

## 2018-09-08 LAB — CBC WITH DIFFERENTIAL/PLATELET
Abs Immature Granulocytes: 0.01 10*3/uL (ref 0.00–0.07)
Basophils Absolute: 0.1 10*3/uL (ref 0.0–0.1)
Basophils Relative: 2 %
Eosinophils Absolute: 0 10*3/uL (ref 0.0–0.5)
Eosinophils Relative: 1 %
HCT: 27.3 % — ABNORMAL LOW (ref 36.0–46.0)
Hemoglobin: 8.3 g/dL — ABNORMAL LOW (ref 12.0–15.0)
Immature Granulocytes: 0 %
Lymphocytes Relative: 24 %
Lymphs Abs: 0.7 10*3/uL (ref 0.7–4.0)
MCH: 27.5 pg (ref 26.0–34.0)
MCHC: 30.4 g/dL (ref 30.0–36.0)
MCV: 90.4 fL (ref 80.0–100.0)
Monocytes Absolute: 0.4 10*3/uL (ref 0.1–1.0)
Monocytes Relative: 15 %
Neutro Abs: 1.7 10*3/uL (ref 1.7–7.7)
Neutrophils Relative %: 58 %
Platelets: 83 10*3/uL — ABNORMAL LOW (ref 150–400)
RBC: 3.02 MIL/uL — ABNORMAL LOW (ref 3.87–5.11)
RDW: 23.2 % — ABNORMAL HIGH (ref 11.5–15.5)
WBC: 3 10*3/uL — ABNORMAL LOW (ref 4.0–10.5)
nRBC: 0 % (ref 0.0–0.2)

## 2018-09-08 LAB — SARS CORONAVIRUS 2 BY RT PCR (HOSPITAL ORDER, PERFORMED IN ~~LOC~~ HOSPITAL LAB): SARS Coronavirus 2: NEGATIVE

## 2018-09-08 LAB — AMMONIA: Ammonia: 61 umol/L — ABNORMAL HIGH (ref 9–35)

## 2018-09-08 LAB — LIPASE, BLOOD: Lipase: 55 U/L — ABNORMAL HIGH (ref 11–51)

## 2018-09-08 IMAGING — US US ABDOMEN LIMITED
1 series · 5 of 5 positions shown · non-contrast
Comparison: None.

CLINICAL DATA: Patient with a history of cirrhosis and recurrent
abdominal ascites. Request was made today for paracentesis.

EXAM:
LIMITED ABDOMEN ULTRASOUND FOR ASCITES
TECHNIQUE: Limited ultrasound survey for ascites was performed in all four
abdominal quadrants.

[Series 1: us abdomen limited · 0.26mm/px · 5 of 5 slices shown]
[im 1/5]
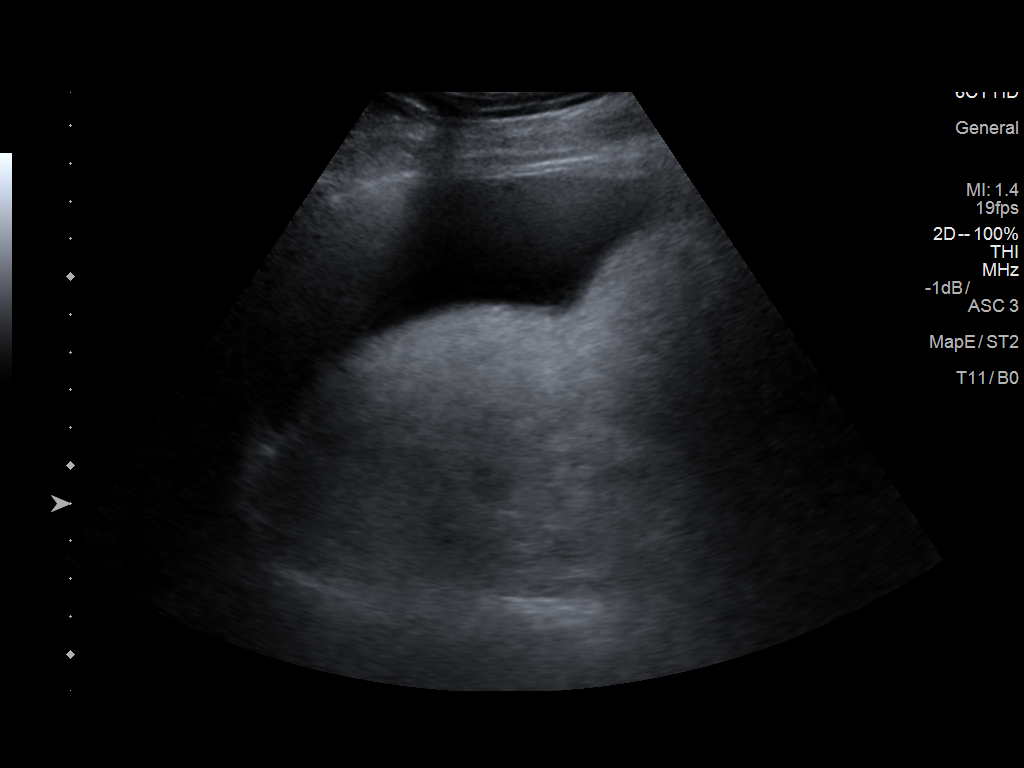
[im 2/5]
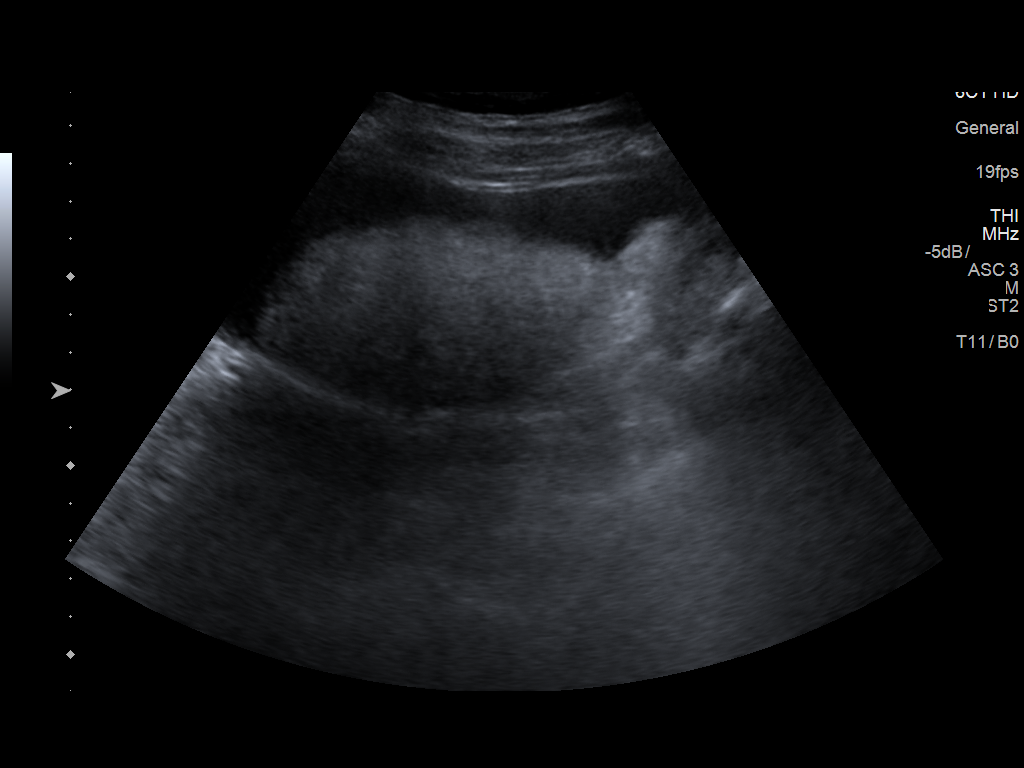
[im 3/5]
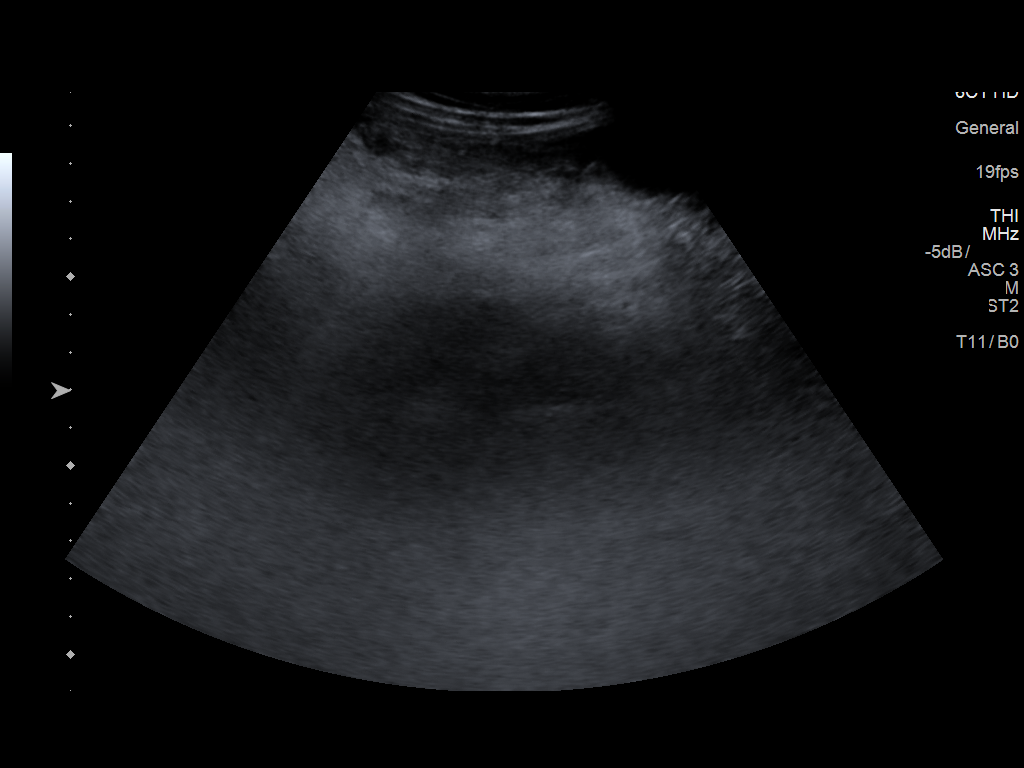
[im 4/5]
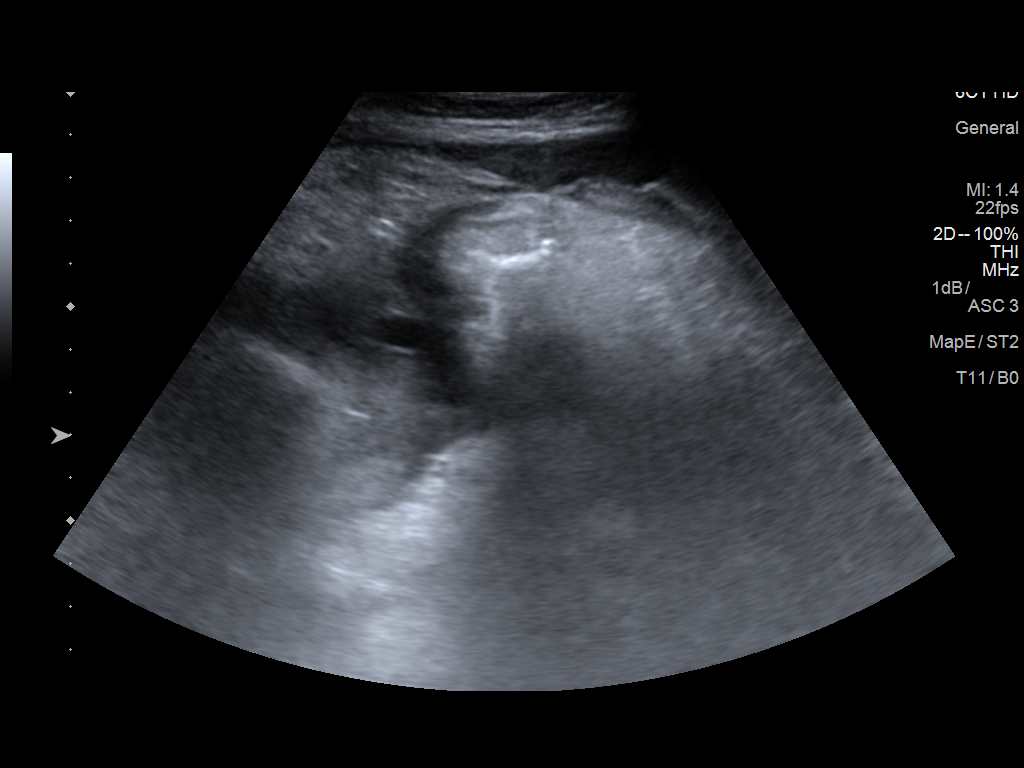
[im 5/5]
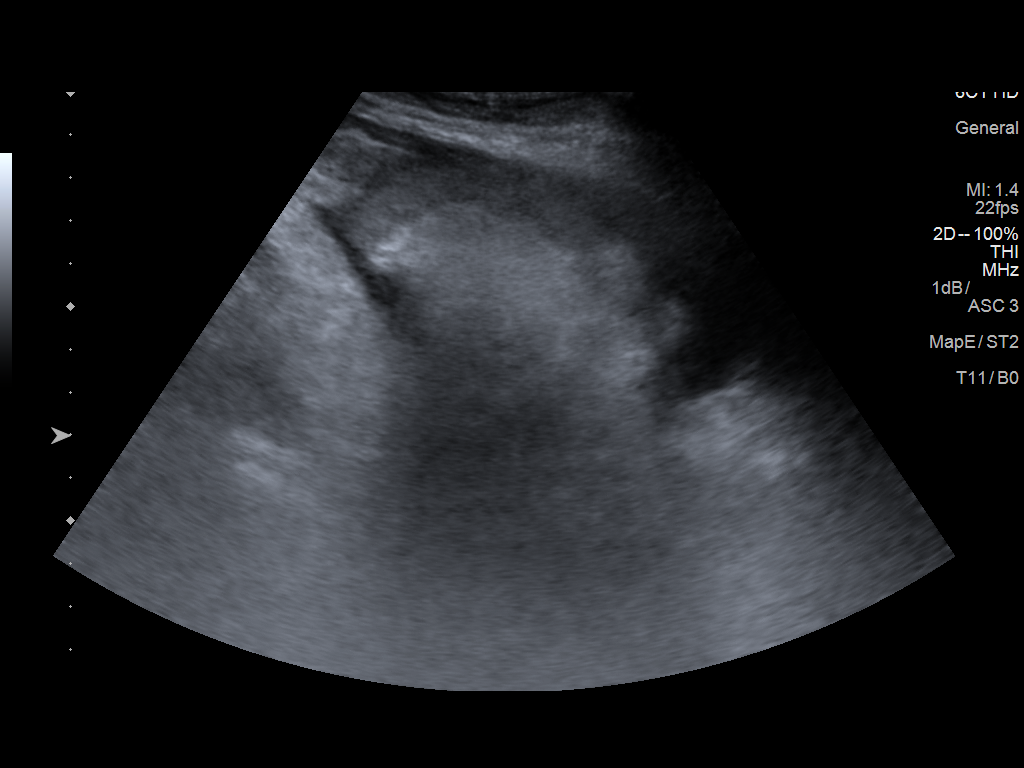

[5 of 5 positions shown; findings below may reference images not displayed]

FINDINGS: A minimal amount of ascites is noted throughout the abdomen. Due to
bowel in close proximity to her peritoneum, there is no safe window
in order to perform the paracentesis for fluid removal.
IMPRESSION: Minimal ascites with unsafe window to perform paracentesis.
Procedure not done.

## 2018-09-08 MED ORDER — CHLORDIAZEPOXIDE HCL 25 MG PO CAPS
25.0000 mg | ORAL_CAPSULE | Freq: Once | ORAL | Status: AC
  Administered 2018-09-09: 25 mg via ORAL
  Filled 2018-09-08: qty 1

## 2018-09-08 MED ORDER — HYDROXYZINE HCL 25 MG PO TABS
25.0000 mg | ORAL_TABLET | Freq: Every day | ORAL | Status: DC | PRN
  Administered 2018-09-09: 25 mg via ORAL
  Filled 2018-09-08 (×2): qty 1

## 2018-09-08 MED ORDER — SODIUM CHLORIDE 0.9% FLUSH: 3.0000 mL | INTRAVENOUS | Status: DC | PRN

## 2018-09-08 MED ORDER — POTASSIUM CHLORIDE 10 MEQ/100ML IV SOLN
10.0000 meq | INTRAVENOUS | Status: AC
  Administered 2018-09-08 (×3): 10 meq via INTRAVENOUS
  Filled 2018-09-08 (×2): qty 100

## 2018-09-08 MED ORDER — SPIRONOLACTONE 25 MG PO TABS
25.0000 mg | ORAL_TABLET | Freq: Every day | ORAL | Status: DC
  Administered 2018-09-09 – 2018-09-10 (×2): 25 mg via ORAL
  Filled 2018-09-08 (×2): qty 1

## 2018-09-08 MED ORDER — LORAZEPAM 1 MG PO TABS: 1.0000 mg | ORAL_TABLET | Freq: Four times a day (QID) | ORAL | Status: DC | PRN

## 2018-09-08 MED ORDER — SODIUM CHLORIDE 0.9% FLUSH
3.0000 mL | Freq: Two times a day (BID) | INTRAVENOUS | Status: DC
  Administered 2018-09-09 – 2018-09-13 (×9): 3 mL via INTRAVENOUS

## 2018-09-08 MED ORDER — FOLIC ACID 1 MG PO TABS
1.0000 mg | ORAL_TABLET | Freq: Every day | ORAL | Status: DC
  Administered 2018-09-08 – 2018-09-13 (×6): 1 mg via ORAL
  Filled 2018-09-08 (×6): qty 1

## 2018-09-08 MED ORDER — POTASSIUM CHLORIDE 10 MEQ/100ML IV SOLN
10.0000 meq | INTRAVENOUS | Status: AC
  Filled 2018-09-08: qty 100

## 2018-09-08 MED ORDER — POTASSIUM CHLORIDE CRYS ER 20 MEQ PO TBCR
40.0000 meq | EXTENDED_RELEASE_TABLET | Freq: Two times a day (BID) | ORAL | Status: DC
  Administered 2018-09-08 – 2018-09-13 (×10): 40 meq via ORAL
  Filled 2018-09-08 (×10): qty 2

## 2018-09-08 MED ORDER — LORAZEPAM 2 MG/ML IJ SOLN: 1.0000 mg | Freq: Four times a day (QID) | INTRAMUSCULAR | Status: DC | PRN

## 2018-09-08 MED ORDER — LORAZEPAM 2 MG/ML IJ SOLN
0.0000 mg | Freq: Two times a day (BID) | INTRAMUSCULAR | Status: AC
  Administered 2018-09-12: 1 mg via INTRAVENOUS
  Filled 2018-09-08: qty 1

## 2018-09-08 MED ORDER — FUROSEMIDE 40 MG PO TABS
40.0000 mg | ORAL_TABLET | Freq: Every day | ORAL | Status: DC
  Administered 2018-09-09 – 2018-09-10 (×2): 40 mg via ORAL
  Filled 2018-09-08 (×2): qty 1

## 2018-09-08 MED ORDER — LACTULOSE 10 GM/15ML PO SOLN
20.0000 g | Freq: Two times a day (BID) | ORAL | Status: DC
  Administered 2018-09-08 – 2018-09-12 (×6): 20 g via ORAL
  Filled 2018-09-08 (×9): qty 30

## 2018-09-08 MED ORDER — GABAPENTIN 100 MG PO CAPS
200.0000 mg | ORAL_CAPSULE | Freq: Three times a day (TID) | ORAL | Status: DC
  Administered 2018-09-08 – 2018-09-13 (×14): 200 mg via ORAL
  Filled 2018-09-08 (×14): qty 2

## 2018-09-08 MED ORDER — KETOROLAC TROMETHAMINE 30 MG/ML IJ SOLN
30.0000 mg | Freq: Once | INTRAMUSCULAR | Status: AC
  Administered 2018-09-09: 30 mg via INTRAVENOUS
  Filled 2018-09-08: qty 1

## 2018-09-08 MED ORDER — VITAMIN B-1 100 MG PO TABS
100.0000 mg | ORAL_TABLET | Freq: Every day | ORAL | Status: DC
  Administered 2018-09-08 – 2018-09-13 (×6): 100 mg via ORAL
  Filled 2018-09-08 (×6): qty 1

## 2018-09-08 MED ORDER — PANTOPRAZOLE SODIUM 40 MG PO TBEC
40.0000 mg | DELAYED_RELEASE_TABLET | Freq: Every day | ORAL | Status: DC
  Administered 2018-09-09 – 2018-09-13 (×5): 40 mg via ORAL
  Filled 2018-09-08 (×5): qty 1

## 2018-09-08 MED ORDER — ADULT MULTIVITAMIN W/MINERALS CH
1.0000 | ORAL_TABLET | Freq: Every day | ORAL | Status: DC
  Administered 2018-09-08 – 2018-09-13 (×6): 1 via ORAL
  Filled 2018-09-08 (×6): qty 1

## 2018-09-08 MED ORDER — LORAZEPAM 2 MG/ML IJ SOLN
0.0000 mg | Freq: Four times a day (QID) | INTRAMUSCULAR | Status: AC
  Administered 2018-09-09 – 2018-09-10 (×3): 2 mg via INTRAVENOUS
  Filled 2018-09-08 (×3): qty 1

## 2018-09-08 MED ORDER — THIAMINE HCL 100 MG/ML IJ SOLN: 100.0000 mg | Freq: Every day | INTRAMUSCULAR | Status: DC

## 2018-09-08 MED ORDER — PRO-STAT SUGAR FREE PO LIQD
30.0000 mL | Freq: Two times a day (BID) | ORAL | Status: DC
  Administered 2018-09-08 – 2018-09-13 (×8): 30 mL via ORAL
  Filled 2018-09-08 (×8): qty 30

## 2018-09-08 MED ORDER — CHLORDIAZEPOXIDE HCL 25 MG PO CAPS
50.0000 mg | ORAL_CAPSULE | Freq: Once | ORAL | Status: AC
  Administered 2018-09-08: 21:00:00 50 mg via ORAL
  Filled 2018-09-08: qty 2

## 2018-09-08 MED ORDER — SODIUM CHLORIDE 0.9 % IV SOLN: 250.0000 mL | INTRAVENOUS | Status: DC | PRN

## 2018-09-08 MED ORDER — ONDANSETRON 4 MG PO TBDP
8.0000 mg | ORAL_TABLET | Freq: Three times a day (TID) | ORAL | Status: DC | PRN
  Administered 2018-09-09 – 2018-09-12 (×3): 8 mg via ORAL
  Filled 2018-09-08 (×3): qty 2
  Filled 2018-09-08: qty 1

## 2018-09-08 NOTE — ED Notes (Signed)
ED TO INPATIENT HANDOFF REPORT  ED Nurse Name and Phone #: Tereso NewcomerZach T  S Name/Age/Gender Anne Black 58 y.o. female Room/Bed: WA25/WA25  Code Status   Code Status: Full Code  Home/SNF/Other Given to floor Patient oriented to: self, place, time and situation Is this baseline? Yes   Triage Complete: Triage complete  Chief Complaint abdominal pain  Triage Note Per EMS Pt is complaining of abd pain, and acid reflux for the past 2x days. Abd distention, skin tear present on the left forearm. Paper work was signed on pt to designate her as incompetent, and husband is now the legal guardian. Husband could not be present due to being sick with covid (pt does not live with husband), pts daughters can be contacted in [place] husband.  Guardian would also like to try and place the pt.   Allergies No Known Allergies  Level of Care/Admitting Diagnosis ED Disposition    ED Disposition Condition Comment   Admit  [facility] [[id number]]  Level of Care: Telemetry [5]  Admit to tele based on following criteria: Monitor for Ischemic changes  Covid Evaluation: Person Under Investigation (PUI)  Diagnosis: Abdominal pain [161096][[id number]]  Admitting Physician: Pearson GrippeKIM, JAMES [[id number]]  Attending Physician: Pearson GrippeKIM, JAMES [[id number]]  PT Class (Do Not Modify): Observation [104]  PT Acc Code (Do Not Modify): Observation [10022]       B Medical/Surgery History Past Medical History:  Diagnosis Date  . ADHD (attention deficit hyperactivity disorder)   . Alcoholic cirrhosis (HCC)    with ascites  . Alcoholism (HCC)   . Allergies   . Anxiety   . Arthritis   . Atrial fibrillation with RVR (HCC)   . Bipolar disorder (HCC)   . COPD (chronic obstructive pulmonary disease) (HCC)    wears 2L chronic O2  . Dyspnea   . EtOH dependence (HCC) 05/14/2018  . GERD (gastroesophageal reflux disease)   . History of hiatal hernia   . Hypokalemia   . Migraine   . Multiple sclerosis (HCC)    . Narcolepsy   . Osteoarthritis of knee   . Osteoporosis   . Pneumonia   . UTI (urinary tract infection) 08/11/2018   Past Surgical History:  Procedure Laterality Date  . ABDOMINAL WALL DEFECT REPAIR N/A 12/30/2016   Procedure: EXPLORATORY LAPAROTOMY WITH REPAIR ABDOMINAL WALL VENTRAL HERNIA;  Surgeon: Emelia LoronWakefield, Matthew, MD;  [facility];  Service: General;  Laterality: N/A;  . APPLICATION OF WOUND VAC N/A 12/30/2016   Procedure: APPLICATION OF WOUND VAC;  Surgeon: Emelia LoronWakefield, Matthew, MD;  [facility];  Service: General;  Laterality: N/A;  . CESAREAN SECTION  [phone]  . FRACTURE SURGERY    . HERNIA REPAIR    . IR PARACENTESIS  06/14/2018  . IR PARACENTESIS  08/14/2018  . IR PARACENTESIS  08/18/2018  . MYRINGOTOMY WITH TUBE PLACEMENT Bilateral   . ORIF WRIST FRACTURE Left 09/09/2016   Procedure: OPEN REDUCTION INTERNAL FIXATION (ORIF) LEFT WRIST FRACTURE, LEFT CARPAL TUNNEL RELEASE;  Surgeon: Dominica SeverinGramig, William, MD;  Location: [facility] ORS;  Service: Orthopedics;  Laterality: Left;  . TONSILLECTOMY       A IV Location/Drains/Wounds Patient Lines/Drains/Airways Status   Active Line/Drains/Airways    Name:   Placement date:   Placement time:   Site:   Days:   Peripheral IV 09/08/18 Right Hand   09/08/18    1649    Hand   less than 1   Peripheral IV 09/08/18 Right Antecubital  09/08/18    1808    Antecubital   less than 1          Intake/Output Last 24 hours  Intake/Output Summary (Last 24 hours) at 09/08/2018 2232 Last data filed at 09/08/2018 2117 Gross per 24 hour  Intake 200 ml  Output -  Net 200 ml    Labs/Imaging Results for orders placed or performed during the hospital encounter of 09/08/18 (from the past 48 hour(s))  CBC with Differential/Platelet     Status: Abnormal   Collection Time: 09/08/18  4:42 PM  Result Value Ref Range   WBC 3.0 (L) 4.0 - 10.5 K/uL   RBC 3.02 (L) 3.87 - 5.11 MIL/uL   Hemoglobin 8.3 (L) 12.0 - 15.0 g/dL   HCT 40.927.3 (L) 81.136.0 - 91.446.0 %    MCV 90.4 80.0 - 100.0 fL   MCH 27.5 26.0 - 34.0 pg   MCHC 30.4 30.0 - 36.0 g/dL   RDW 78.223.2 (H) 95.611.5 - 21.315.5 %   Platelets 83 (L) 150 - 400 K/uL    Comment: REPEATED TO VERIFY PLATELET COUNT CONFIRMED BY SMEAR SPECIMEN CHECKED FOR CLOTS Immature Platelet Fraction may be clinically indicated, consider ordering this additional test [id number]    nRBC 0.0 0.0 - 0.2 %   Neutrophils Relative % 58 %   Neutro Abs 1.7 1.7 - 7.7 K/uL   Lymphocytes Relative 24 %   Lymphs Abs 0.7 0.7 - 4.0 K/uL   Monocytes Relative 15 %   Monocytes Absolute 0.4 0.1 - 1.0 K/uL   Eosinophils Relative 1 %   Eosinophils Absolute 0.0 0.0 - 0.5 K/uL   Basophils Relative 2 %   Basophils Absolute 0.1 0.0 - 0.1 K/uL   RBC Morphology MORPHOLOGY UNREMARKABLE    Immature Granulocytes 0 %   Abs Immature Granulocytes 0.01 0.00 - 0.07 K/uL    Comment: Performed at [facility], [address] [place], [place] [address]  Comprehensive metabolic panel     Status: Abnormal   Collection Time: 09/08/18  4:42 PM  Result Value Ref Range   Sodium 134 (L) 135 - 145 mmol/L   Potassium 2.7 (LL) 3.5 - 5.1 mmol/L    Comment: CRITICAL RESULT CALLED TO, READ BACK BY AND VERIFIED WITH: PETERS,Z RN @1744  ON 09/08/2018 JACKSON,K    Chloride 89 (L) 98 - 111 mmol/L   CO2 37 (H) 22 - 32 mmol/L   Glucose, Bld 97 70 - 99 mg/dL   BUN <5 (L) 6 - 20 mg/dL   Creatinine, Ser 2.840.37 (L) 0.44 - 1.00 mg/dL   Calcium 7.5 (L) 8.9 - 10.3 mg/dL   Total Protein 6.0 (L) 6.5 - 8.1 g/dL   Albumin 2.6 (L) 3.5 - 5.0 g/dL   AST 37 15 - 41 U/L   ALT 15 0 - 44 U/L   Alkaline Phosphatase 124 38 - 126 U/L   Total Bilirubin 3.1 (H) 0.3 - 1.2 mg/dL   GFR calc non Af Amer >60 >60 mL/min   GFR calc Af Amer >60 >60 mL/min   Anion gap 8 5 - 15    Comment: Performed at [facility], [address] [place], [place] [address]  Lipase, blood     Status: Abnormal   Collection Time: 09/08/18  4:42 PM  Result Value Ref Range    Lipase 55 (H) 11 - 51 U/L    Comment: Performed at [facility], [address] [place], [place] [address]  Ammonia  Status: Abnormal   Collection Time: 09/08/18  4:42 PM  Result Value Ref Range   Ammonia 61 (H) 9 - 35 umol/L    Comment: Performed at [facility], [place] [address], [place], [place] [address]  Ethanol     Status: Abnormal   Collection Time: 09/08/18  4:42 PM  Result Value Ref Range   Alcohol, Ethyl (B) 163 (H) <10 mg/dL    Comment: (NOTE) Lowest detectable limit for serum alcohol is 10 mg/dL. For medical purposes only. Performed at [facility], [place] [address] [place], [place] [address]   SARS Coronavirus 2 [facility] order, Performed in  [facility] hospital lab)     Status: None   Collection Time: 09/08/18  7:45 PM  Result Value Ref Range   SARS Coronavirus 2 NEGATIVE NEGATIVE    Comment: (NOTE) If result is NEGATIVE SARS-CoV-2 target nucleic acids are NOT DETECTED. The SARS-CoV-2 RNA is generally detectable in upper and lower  respiratory specimens during the acute phase of infection. The lowest  concentration of SARS-CoV-2 viral copies this assay can detect is 250  copies / mL. A negative result does not preclude SARS-CoV-2 infection  and should not be used as the sole basis for treatment or other  patient management decisions.  A negative result may occur with  improper specimen collection / handling, submission of specimen other  than nasopharyngeal swab, presence of viral mutation(s) within the  areas targeted by this assay, and inadequate number of viral copies  (<250 copies / mL). A negative result must be combined with clinical  observations, patient history, and epidemiological information. If result is POSITIVE SARS-CoV-2 target nucleic acids are DETECTED. The SARS-CoV-2 RNA is generally detectable in upper and lower  respiratory specimens dur ing the acute phase of infection.  Positive   results are indicative of active infection with SARS-CoV-2.  Clinical  correlation with patient history and other diagnostic information is  necessary to determine patient infection status.  Positive results do  not rule out bacterial infection or co-infection with other viruses. If result is PRESUMPTIVE POSTIVE SARS-CoV-2 nucleic acids MAY BE PRESENT.   A presumptive positive result was obtained on the submitted specimen  and confirmed on repeat testing.  While 2019 novel coronavirus  (SARS-CoV-2) nucleic acids may be present in the submitted sample  additional confirmatory testing may be necessary for epidemiological  and / or clinical management purposes  to differentiate between  SARS-CoV-2 and other Sarbecovirus currently known to infect humans.  If clinically indicated additional testing with an alternate test  methodology [phone]) is advised. The SARS-CoV-2 RNA is generally  detectable in upper and lower respiratory sp ecimens during the acute  phase of infection. The expected result is Negative. Fact Sheet for Patients:  StrictlyIdeas.no Fact Sheet for Healthcare Providers: BankingDealers.co.za This test is not yet approved or cleared by the [place] FDA and has been authorized for detection and/or diagnosis of SARS-CoV-2 by FDA under an Emergency Use Authorization (EUA).  This EUA will remain in effect (meaning this test can be used) for the duration of the COVID-19 declaration under Section 564(b)(1) of the Act, 21 U.S.C. section 360bbb-3(b)(1), unless the authorization is terminated or revoked sooner. Performed at [facility], [place] [address] [place], [place] [address]    No results found.  Pending Labs Unresulted Labs (From admission, onward)    Start     Ordered   09/09/18 0500  Comprehensive metabolic panel  Tomorrow morning,  R     09/08/18 2037   09/09/18 0500  CBC  Tomorrow morning,   R      09/08/18 2037   09/09/18 0500  Lipase, blood  Tomorrow morning,   R     09/08/18 2038   09/08/18 1631  Rapid urine drug screen (hospital performed)  ONCE - STAT,   STAT     09/08/18 1630          Vitals/Pain Today's Vitals   09/08/18 1730 09/08/18 1900 09/08/18 2000 09/08/18 2100  BP: (!) 109/55 105/60 (!) 99/46 (!) 108/52  Pulse: 84 87 91 85  Resp:  14 (!) 22 18  Temp:      SpO2: 100% 100% 100% 99%  PainSc:        Isolation Precautions No active isolations  Medications Medications  sodium chloride flush (NS) 0.9 % injection 3 mL (has no administration in time range)  sodium chloride flush (NS) 0.9 % injection 3 mL (has no administration in time range)  0.9 %  sodium chloride infusion (has no administration in time range)  chlordiazePOXIDE (LIBRIUM) capsule 25 mg (has no administration in time range)  LORazepam (ATIVAN) tablet 1 mg (has no administration in time range)    Or  LORazepam (ATIVAN) injection 1 mg (has no administration in time range)  thiamine (VITAMIN B-1) tablet 100 mg (has no administration in time range)    Or  thiamine (B-1) injection 100 mg (has no administration in time range)  folic acid (FOLVITE) tablet 1 mg (has no administration in time range)  multivitamin with minerals tablet 1 tablet (has no administration in time range)  LORazepam (ATIVAN) injection 0-4 mg (has no administration in time range)    Followed by  LORazepam (ATIVAN) injection 0-4 mg (has no administration in time range)  potassium chloride 10 mEq in 100 mL IVPB (has no administration in time range)  feeding supplement (PRO-STAT SUGAR FREE 64) liquid 30 mL (has no administration in time range)  ondansetron (ZOFRAN-ODT) disintegrating tablet 8 mg (has no administration in time range)  potassium chloride 10 mEq in 100 mL IVPB (10 mEq Intravenous New Bag/Given 09/08/18 2120)  chlordiazePOXIDE (LIBRIUM) capsule 50 mg (50 mg Oral Given 09/08/18 2119)    Mobility non-ambulatory High  fall risk   Focused Assessments Cardiac Assessment Handoff:    Lab Results  Component Value Date   CKTOTAL 623 (H) 06/14/2018   TROPONINI <0.03 07/24/2018   Lab Results  Component Value Date   DDIMER 3.33 (H) 07/01/2018   Does the Patient currently have chest pain? No     R Recommendations: See Admitting Provider Note  Report given to:   Additional Notes:

## 2018-09-08 NOTE — ED Notes (Signed)
Date and time results received: 09/08/18 1745 (use smartphrase ".now" to insert current time)  Test: K+ Critical Value: 2.7  Name of Provider Notified: Dr. Lita Mains  Orders Received? Or Actions Taken?: Orders Received - See Orders for details

## 2018-09-08 NOTE — ED Triage Notes (Addendum)
Per EMS Pt is complaining of abd pain, and acid reflux for the past 2x days. Abd distention, skin tear present on the left forearm. Paper work was signed on pt to designate her as incompetent, and husband is now the legal guardian. Husband could not be present due to being sick with covid (pt does not live with husband), pts daughters can be contacted in [place] of husband.

## 2018-09-08 NOTE — H&P (Addendum)
TRH H&P    Patient Demographics:    Anne Black, is a 58 y.o. female  MRN: [id number]  DOB - [birth year]  Admit Date - 09/08/2018  Referring MD/NP/PA:  Julianne Rice   Outpatient Primary MD for the patient is Patient, No Pcp Per  Patient coming from:   home  Chief complaint- abdominal pain   HPI:    Anne Black  is a 58 y.o. female,  Bipolar, ADD, Multiple Sclerosis, PAfib, Etoh dep, Alcholic cirrhosis w esophageal varices, and ascites, w h/o paracentesis, Lung nodule 4m (inferior lingular), , Gerd, presents with c/o abdominal pain which is mostly epigastric in nature for the past 2 days.  Patient states that she has some nausea but no vomiting.  Patient denies fever chills diarrhea bright red blood per rectum black stool.  She has been taking Prilosec.  The pain is fairly constant.  Food does not seem to make the pain better or worse.  Patient admits to being an alcoholic.  In ED Temperature 98.2 pulse 93 respirations 13 blood pressure 118/57 pulse ox 98% on room air  Wbc 3.0, Hgb 8.3, Plt 83 Na 134, K 2.7 Glucose 97 Bun <5, Creatinine 0.37 Alb 2.6 Ast 37, Alt 15, Alk phos 124, T. Bili 3.1  ETOH 163 Ammonia 61  Pt will be admitted for etoh intoxication, severe hypokalemia, hyponatremia, pancytopenia secondary to etoh, and ascites,      Review of systems:    In addition to the HPI above,  No Fever-chills, No Headache, No changes with Vision or hearing, No problems swallowing food or Liquids, No Chest pain, Cough or Shortness of Breath, No  Vomiting, bowel movements are regular, No Blood in stool or Urine, No dysuria, No new skin rashes or bruises, No new joints pains-aches,  No new weakness, tingling, numbness in any extremity, No recent weight gain or loss, No polyuria, polydypsia or polyphagia, No significant Mental Stressors.  All other systems reviewed and are negative.    Past History of the following :    Past Medical History:  Diagnosis Date  . ADHD (attention deficit hyperactivity disorder)   . Alcoholic cirrhosis ([place]    with ascites  . Alcoholism ([facility]   . Allergies   . Anxiety   . Arthritis   . Atrial fibrillation with RVR ([facility]   . Bipolar disorder ([facility]   . COPD (chronic obstructive pulmonary disease) (HCC)    wears 2L chronic O2  . Dyspnea   . EtOH dependence ([place] 05/14/2018  . GERD (gastroesophageal reflux disease)   . History of hiatal hernia   . Hypokalemia   . Migraine   . Multiple sclerosis ([place]   . Narcolepsy   . Osteoarthritis of knee   . Osteoporosis   . Pneumonia   . UTI (urinary tract infection) 08/11/2018      Past Surgical History:  Procedure Laterality Date  . ABDOMINAL WALL DEFECT REPAIR N/A 12/30/2016   Procedure: EXPLORATORY LAPAROTOMY WITH REPAIR ABDOMINAL WALL VENTRAL HERNIA;  Surgeon: WRolm Bookbinder MD;  [place] [facility]  Service: General;  Laterality: N/A;  . APPLICATION OF WOUND VAC N/A 12/30/2016   Procedure: APPLICATION OF WOUND VAC;  Surgeon: Rolm Bookbinder, MD;  Location: [facility];  Service: General;  Laterality: N/A;  . CESAREAN SECTION  [phone]  . FRACTURE SURGERY    . HERNIA REPAIR    . IR PARACENTESIS  06/14/2018  . IR PARACENTESIS  08/14/2018  . IR PARACENTESIS  08/18/2018  . MYRINGOTOMY WITH TUBE PLACEMENT Bilateral   . ORIF WRIST FRACTURE Left 09/09/2016   Procedure: OPEN REDUCTION INTERNAL FIXATION (ORIF) LEFT WRIST FRACTURE, LEFT CARPAL TUNNEL RELEASE;  Surgeon: Roseanne Kaufman, MD;  Location[facility];  Service: Orthopedics;  Laterality: Left;  . TONSILLECTOMY        Social History:      Social History   Tobacco Use  . Smoking status: Current Every Day Smoker    Packs/day: 1.00    Years: 36.00    Pack years: 36.00    Types: Cigarettes  . Smokeless tobacco: Never Used  Substance Use Topics  . Alcohol use: Yes    Comment: Chronic ETOH abuse       Family History :      Family History  Problem Relation Age of Onset  . Arrhythmia Mother   . Neuropathy Mother   . Coronary artery disease Mother   . Heart disease Mother   . Heart disease Father   . Hypertension Father   . Heart attack Father   . Multiple sclerosis Maternal Aunt        Home Medications:   Prior to Admission medications   Medication Sig Start Date End Date Taking? Authorizing Provider  folic acid (FOLVITE) 1 MG tablet Take 1 tablet (1 mg total) by mouth daily. 08/21/18   Samuella Cota, MD  furosemide (LASIX) 40 MG tablet Take 1 tablet (40 mg total) by mouth daily. 06/27/18   Maudie Flakes, MD  gabapentin (NEURONTIN) 100 MG capsule Take 2 capsules (200 mg total) by mouth 3 (three) times daily for 30 days. 07/19/18 08/18/18  Domenic Moras, PA-C  hydrOXYzine (ATARAX/VISTARIL) 25 MG tablet Take 1 tablet (25 mg total) by mouth daily as needed for up to 30 doses (anxiety). 04/14/18   Tegeler, Gwenyth Allegra, MD  lactulose (CHRONULAC) 10 GM/15ML solution Take 30 mLs (20 g total) by mouth 2 (two) times daily. 08/20/18   Samuella Cota, MD  LORazepam (ATIVAN) 0.5 MG tablet Take 1 tablet (0.5 mg total) by mouth every 12 (twelve) hours as needed for anxiety. 08/20/18   Samuella Cota, MD  Multiple Vitamin (MULTIVITAMIN WITH MINERALS) TABS tablet Take 1 tablet by mouth daily. 08/21/18   Samuella Cota, MD  ondansetron (ZOFRAN ODT) 8 MG disintegrating tablet Take 1 tablet (8 mg total) by mouth every 8 (eight) hours as needed for nausea or vomiting. 09/05/18   Jola Schmidt, MD  ondansetron (ZOFRAN) 4 MG tablet Take 1 tablet (4 mg total) by mouth every 6 (six) hours. 08/22/18   Hayden Rasmussen, MD  pantoprazole (PROTONIX) 20 MG tablet Take 2 tablets (40 mg total) by mouth daily. 07/29/18   Mesner, Corene Cornea, MD  potassium chloride SA (K-DUR,KLOR-CON) 20 MEQ tablet Take 2 tablets (40 mEq total) by mouth 2 (two) times daily. 04/19/18   Recardo Evangelist, PA-C  spironolactone (ALDACTONE) 25 MG tablet Take 1  tablet (25 mg total) by mouth daily for 30 days. 06/27/18 08/10/18  Maudie Flakes, MD  thiamine 100 MG tablet Take 1 tablet (  100 mg total) by mouth daily. 08/21/18   Samuella Cota, MD     Allergies:    No Known Allergies   Physical Exam:   Vitals  Blood pressure (!) 109/55, pulse 84, temperature 98.2 F (36.8 C), resp. rate 13, SpO2 100 %.  1.  General: Axoxo3,   2. Psychiatric: euthymic  3. Neurologic: cn2-12 intact, reflexes 2+ symmetric, diffuse with no clonus, motor 5/5 in all 4 ext   4. HEENMT:  Anicteric, pupils 1.56m symmetric, direct, consensual, near intact Neck: no jvd  5. Respiratory : CTAB  6. Cardiovascular : rrr s1, s2, no m/g/r  7. Gastrointestinal:  Abd:: soft, slightly distended, nt, + bs (normoactive)  8. Skin:  Ext: no c/c/e, no rash Palmar erythema  9.Musculoskeletal:  Good ROM,   No adenopathy    Data Review:    CBC Recent Labs  Lab 09/07/18 0738 09/08/18 1642  WBC 2.9* 3.0*  HGB 9.1* 8.3*  HCT 30.4* 27.3*  PLT 99* 83*  MCV 89.4 90.4  MCH 26.8 27.5  MCHC 29.9* 30.4  RDW 23.3* 23.2*  LYMPHSABS  --  0.7  MONOABS  --  0.4  EOSABS  --  0.0  BASOSABS  --  0.1   ------------------------------------------------------------------------------------------------------------------  Results for orders placed or performed during the hospital encounter of 09/08/18 (from the past 48 hour(s))  CBC with Differential/Platelet     Status: Abnormal   Collection Time: 09/08/18  4:42 PM  Result Value Ref Range   WBC 3.0 (L) 4.0 - 10.5 K/uL   RBC 3.02 (L) 3.87 - 5.11 MIL/uL   Hemoglobin 8.3 (L) 12.0 - 15.0 g/dL   HCT 27.3 (L) 36.0 - 46.0 %   MCV 90.4 80.0 - 100.0 fL   MCH 27.5 26.0 - 34.0 pg   MCHC 30.4 30.0 - 36.0 g/dL   RDW 23.2 (H) 11.5 - 15.5 %   Platelets 83 (L) 150 - 400 K/uL    Comment: REPEATED TO VERIFY PLATELET COUNT CONFIRMED BY SMEAR SPECIMEN CHECKED FOR CLOTS Immature Platelet Fraction may be clinically indicated,  consider ordering this additional test [id number]   nRBC 0.0 0.0 - 0.2 %   Neutrophils Relative % 58 %   Neutro Abs 1.7 1.7 - 7.7 K/uL   Lymphocytes Relative 24 %   Lymphs Abs 0.7 0.7 - 4.0 K/uL   Monocytes Relative 15 %   Monocytes Absolute 0.4 0.1 - 1.0 K/uL   Eosinophils Relative 1 %   Eosinophils Absolute 0.0 0.0 - 0.5 K/uL   Basophils Relative 2 %   Basophils Absolute 0.1 0.0 - 0.1 K/uL   RBC Morphology MORPHOLOGY UNREMARKABLE    Immature Granulocytes 0 %   Abs Immature Granulocytes 0.01 0.00 - 0.07 K/uL    Comment: [facility]formed at [facility] [id number], [place]  [id number] Comprehensive metabolic panel     Status: Abnormal   Collection Time: 09/08/18  4:42 PM  Result Value Ref Range   Sodium 134 (L) 135 - 145 mmol/L   Potassium 2.7 (LL) 3.5 - 5.1 mmol/L    Comment: CRITICAL RESULT CALLED TO, READ BACK BY AND VERIFIED WITH: PETERS,Z RN _0  ON 09/08/2018 JACKSON,K    Chloride 89 (L) 98 - 111 mmol/L   CO2 37 (H) 22 - 32 mmol/L   Glucose, Bld 97 70 - 99 mg/dL   BUN <5 (L) 6 - 20 mg/dL   Creatinine, Ser 0.37 (L) 0.44 - 1.00 mg/dL   Calcium  7.5 (L) 8.9 - 10.3 mg/dL   Total Protein 6.0 (L) 6.5 - 8.1 g/dL   Albumin 2.6 (L) 3.5 - 5.0 g/dL   AST 37 15 - 41 U/L   ALT 15 0 - 44 U/L   Alkaline Phosphatase 124 38 - 126 U/L   Total Bilirubin 3.1 (H) 0.3 - 1.2 mg/dL   GFR calc non Af Amer >60 >60 mL/min   GFR calc Af Amer >60 >60 mL/min   Anion gap 8 5 - 15    Comment: Performed at [facility], [place] [address], [place], [place] [address]  Lipase, blood     Status: Abnormal   Collection Time: 09/08/18  4:42 PM  Result Value Ref Range   Lipase 55 (H) 11 - 51 U/L    Comment: Performed at [facility], [place] [address] [place], [place] [address]  Ammonia     Status: Abnormal   Collection Time: 09/08/18  4:42 PM  Result Value Ref Range   Ammonia 61 (H) 9 - 35 umol/L    Comment: Performed at [facility], [place] [address], [place], [place] [address]  Ethanol     Status: Abnormal   Collection Time: 09/08/18  4:42 PM  Result Value Ref Range   Alcohol, Ethyl (B) 163 (H) <10 mg/dL    Comment: (NOTE) Lowest detectable limit for serum alcohol is 10 mg/dL. For medical purposes only. Performed at [facility], [address] [place], [place] [address]     Chemistries  Recent Labs  Lab 09/07/18 [phone] 09/08/18 1642  NA 136 134*  K 2.6* 2.7*  CL 88* 89*  CO2 38* 37*  GLUCOSE 93 97  BUN <5* <5*  CREATININE 0.48 0.37*  CALCIUM 7.6* 7.5*  MG 1.9  --   AST 41 37  ALT 14 15  ALKPHOS 130* 124  BILITOT 3.5* 3.1*   ------------------------------------------------------------------------------------------------------------------  ------------------------------------------------------------------------------------------------------------------ GFR: CrCl cannot be calculated (Unknown ideal weight.). Liver Function Tests: Recent Labs  Lab 09/07/18 0738 09/08/18 1642  AST 41 37  ALT 14 15  ALKPHOS 130* 124  BILITOT 3.5* 3.1*  PROT 6.2* 6.0*  ALBUMIN 2.7* 2.6*   Recent Labs  Lab 09/08/18 1642  LIPASE 55*   Recent Labs  Lab 09/08/18 1642  AMMONIA 61*   Coagulation Profile: No results for input(s): INR, PROTIME in the last 168 hours. Cardiac Enzymes: No results for input(s): CKTOTAL, CKMB, CKMBINDEX, TROPONINI in the last 168 hours. BNP (last 3 results) No results for input(s): PROBNP in the last 8760 hours. HbA1C: No results for input(s): HGBA1C in the last 72 hours. CBG: No results for input(s): GLUCAP in the last 168 hours. Lipid Profile: No results for input(s): CHOL, HDL, LDLCALC, TRIG, CHOLHDL, LDLDIRECT in the last 72 hours. Thyroid Function Tests: No results for input(s): TSH, T4TOTAL, FREET4, T3FREE, THYROIDAB in the last 72 hours. Anemia Panel: No results for input(s): VITAMINB12, FOLATE, FERRITIN, TIBC, IRON, RETICCTPCT in the last  72 hours.  --------------------------------------------------------------------------------------------------------------- Urine analysis:    Component Value Date/Time   COLORURINE AMBER (A) 08/10/2018 1840   APPEARANCEUR CLOUDY (A) 08/10/2018 1840   LABSPEC 1.015 08/10/2018 1840   PHURINE 5.0 08/10/2018 1840   GLUCOSEU NEGATIVE 08/10/2018 1840   HGBUR SMALL (A) 08/10/2018 1840   BILIRUBINUR NEGATIVE 08/10/2018 1840   BILIRUBINUR moderate (A) 06/19/2015 1906   KETONESUR NEGATIVE 08/10/2018 1840   PROTEINUR NEGATIVE 08/10/2018 1840   UROBILINOGEN >=8.0 06/19/2015 1906   UROBILINOGEN  0.2 09/14/2011 2141   NITRITE NEGATIVE 08/10/2018 1840   LEUKOCYTESUR MODERATE (A) 08/10/2018 1840      Imaging Results:    No results found.     Assessment & Plan:    Principal Problem:   Abdominal pain Active Problems:   Depression   GERD (gastroesophageal reflux disease)   Tobacco abuse   Alcohol use disorder, moderate, dependence (HCC)   Anxiety  Abdominal discomfort likely secondary to ascites U/s guided paracentesis r/o SBP Rocephin iv  ETOH intoxication Librium 73m po qday x1 day then 233mpo qday x 1 day CIWA  ETOH cirrhosis with esohpageal varices, ascites Cont PPI Cont Spironolactone 2534mo qday Cont Lasix 24m28m qday Pt counselled on cessation of etoh use  Hyperammonemia Cont Lactulose  Gerd Cont PPI  Anxiety Cont Atarax Cont Lorazepam  Pancytopenia Please stop ETOH use  Severe protein calorie malnutrition prostat 30mL79mbid  DVT Prophylaxis-   SCDs   AM Labs Ordered, also please review Full Orders  Family Communication: Admission, patients condition and plan of care including tests being ordered have been discussed with the patient  who indicate understanding and agree with the plan and Code Status.  Code Status:  FULL CODE< , attempted to call spouse,  No response, left message that patient admitted to [facility] ([facility]-[phone] status:  Observation: Based on patients clinical presentation and evaluation of above clinical data, I have made determination that patient meets observation criteria at this time.  Time spent in minutes : 70 minutes   JamesJani Gravelon 09/08/2018 at 7:31 PM

## 2018-09-08 NOTE — ED Notes (Signed)
Pt placed on purewick for urine collection. 

## 2018-09-08 NOTE — ED Provider Notes (Signed)
[facility] Note   CSN: [id number] Arrival date & time: 09/08/18  1421    History   Chief Complaint Chief Complaint  Patient presents with  . Abdominal Pain    HPI Anne Black is a 58 y.o. female.     HPI Patient with history of chronic alcoholism, cirrhosis, COPD presents with generalized abdominal pain.  Patient appears to be intoxicated and is not a reliable historian.  Level 5 caveat applies. Past Medical History:  Diagnosis Date  . ADHD (attention deficit hyperactivity disorder)   . Alcoholic cirrhosis (HCC)    with ascites  . Alcoholism (HCC)   . Allergies   . Anxiety   . Arthritis   . Atrial fibrillation with RVR (HCC)   . Bipolar disorder (HCC)   . COPD (chronic obstructive pulmonary disease) (HCC)    wears 2L chronic O2  . Dyspnea   . EtOH dependence (HCC) 05/14/2018  . GERD (gastroesophageal reflux disease)   . History of hiatal hernia   . Hypokalemia   . Migraine   . Multiple sclerosis (HCC)   . Narcolepsy   . Osteoarthritis of knee   . Osteoporosis   . Pneumonia   . UTI (urinary tract infection) 08/11/2018    Patient Active Problem List   Diagnosis Date Noted  . ADHD (attention deficit hyperactivity disorder)   . Anemia 08/16/2018  . Palliative care by specialist   . Cellulitis of lower extremity   . Peripheral edema   . Atrial fibrillation with RVR (HCC) 06/13/2018  . Alcohol dependence (HCC) 05/06/2018  . Electrolyte disturbance 05/05/2018  . Alcohol withdrawal delirium (HCC) 04/28/2018  . Weakness generalized 04/27/2018  . Gait abnormality 04/24/2018  . Chronic hypoxemic respiratory failure (HCC) 04/11/2018  . Atrial fibrillation with rapid ventricular response (HCC) 03/23/2018  . Head injury   . Fall 03/19/2018  . Syncope 03/19/2018  . Orbital fracture 03/19/2018  . Scalp laceration 03/19/2018  . Acute respiratory failure with hypoxia and hypercarbia (HCC) 03/19/2018  . Abdominal  tenderness 03/19/2018  . Acute on chronic respiratory failure with hypoxia (HCC) 03/17/2018  . Alcohol abuse with intoxication (HCC) 03/17/2018  . ETOH abuse   . Normocytic anemia 03/16/2018  . Acute metabolic encephalopathy 03/16/2018  . Lower extremity edema 03/16/2018  . Migraine 03/16/2018  . A-fib (HCC) 02/18/2018  . Community acquired pneumonia of left lower lobe of lung (HCC)   . Weakness 08/21/2017  . COPD exacerbation (HCC) 06/12/2017  . Chest pain with moderate risk for cardiac etiology 05/30/2017  . Abnormal LFTs 05/30/2017  . Tachycardia 05/30/2017  . COPD (chronic obstructive pulmonary disease) (HCC) 04/19/2017  . Right rib fracture 04/15/2017  . Abdominal wall cellulitis 12/30/2016  . S/P exploratory laparotomy 12/30/2016  . Distal radius fracture, left 09/09/2016  . Thrombocytopenia (HCC) 09/09/2016  . Chest x-ray abnormality   . Umbilical hernia without obstruction and without gangrene 05/12/2016  . Itching 02/24/2016  . Ventral hernia without obstruction or gangrene 02/24/2016  . Goals of care, counseling/discussion   . Ascites   . Tachypnea   . Atrial flutter with rapid ventricular response (HCC)   . Hypomagnesemia   . Hypoxia   . Dehydration 01/22/2016  . Low back ache 12/30/2015  . Non-intractable vomiting with nausea   . Alcohol abuse with alcohol-induced mood disorder (HCC) 12/15/2015  . Acute alcoholism (HCC)   . Distended abdomen 11/04/2015  . Alcohol abuse   . Dyspnea   . Acute respiratory failure (HCC)  10/15/2015  . Ascites due to alcoholic cirrhosis ([place]) 84/69/6295  . Osteoporosis   . Multiple sclerosis ([place])   . Alcoholic cirrhosis of liver with ascites ([place]) 08/22/2015  . Bipolar disorder, current episode mixed, moderate ([place]) 08/01/2015  . Alcohol use disorder, severe, dependence ([place]) 07/31/2015  . Macrocytic anemia- due to alcohol abuse with normal B12 & folate levels 05/31/2015  . Severe protein-calorie malnutrition ([place]) 05/31/2015  .  Hypokalemia 05/26/2015  . Alcohol withdrawal ([place]) 05/18/2015  . Abdominal pain 05/18/2015  . Anxiety 05/18/2015  . Stimulant abuse ([place]) 12/27/2014  . Nicotine abuse 12/27/2014  . Hyperprolactinemia ([place]) 11/15/2014  . Dyslipidemia   . Alcohol use disorder, moderate, dependence ([place]) 10/30/2014  . Tobacco abuse 01/23/2014  . Bipolar 1 disorder ([place]) 04/07/2013  . Noncompliance with therapeutic plan 04/04/2013  . Hereditary and idiopathic peripheral neuropathy 05/01/2012  . Depression 05/01/2012  . GERD (gastroesophageal reflux disease) 05/01/2012  . Osteoarthrosis, unspecified whether generalized or localized, involving lower leg 05/01/2012  . Hyponatremia 07/01/2011    Past Surgical History:  Procedure Laterality Date  . ABDOMINAL WALL DEFECT REPAIR N/A 12/30/2016   Procedure: EXPLORATORY LAPAROTOMY WITH REPAIR ABDOMINAL WALL VENTRAL HERNIA;  Surgeon: Rolm Bookbinder, MD;  Location: [place];  Service: General;  Laterality: N/A;  . APPLICATION OF WOUND VAC N/A 12/30/2016   Procedure: APPLICATION OF WOUND VAC;  Surgeon: Rolm Bookbinder, MD;  Location: [place];  Service: General;  Laterality: N/A;  . CESAREAN SECTION  [phone]  . FRACTURE SURGERY    . HERNIA REPAIR    . IR PARACENTESIS  06/14/2018  . IR PARACENTESIS  08/14/2018  . IR PARACENTESIS  08/18/2018  . MYRINGOTOMY WITH TUBE PLACEMENT Bilateral   . ORIF WRIST FRACTURE Left 09/09/2016   Procedure: OPEN REDUCTION INTERNAL FIXATION (ORIF) LEFT WRIST FRACTURE, LEFT CARPAL TUNNEL RELEASE;  Surgeon: Roseanne Kaufman, MD;  Location: [facility];  Service: Orthopedics;  Laterality: Left;  . TONSILLECTOMY       OB History   No obstetric history on file.      Home Medications    Prior to Admission medications   Medication Sig Start Date End Date Taking? Authorizing Provider  folic acid (FOLVITE) 1 MG tablet Take 1 tablet (1 mg total) by mouth daily. 08/21/18   Samuella Cota, MD  furosemide (LASIX) 40 MG tablet Take 1 tablet (40  mg total) by mouth daily. 06/27/18   Maudie Flakes, MD  gabapentin (NEURONTIN) 100 MG capsule Take 2 capsules (200 mg total) by mouth 3 (three) times daily for 30 days. 07/19/18 08/18/18  Domenic Moras, PA-C  hydrOXYzine (ATARAX/VISTARIL) 25 MG tablet Take 1 tablet (25 mg total) by mouth daily as needed for up to 30 doses (anxiety). 04/14/18   Tegeler, Gwenyth Allegra, MD  lactulose (CHRONULAC) 10 GM/15ML solution Take 30 mLs (20 g total) by mouth 2 (two) times daily. 08/20/18   Samuella Cota, MD  LORazepam (ATIVAN) 0.5 MG tablet Take 1 tablet (0.5 mg total) by mouth every 12 (twelve) hours as needed for anxiety. 08/20/18   Samuella Cota, MD  Multiple Vitamin (MULTIVITAMIN WITH MINERALS) TABS tablet Take 1 tablet by mouth daily. 08/21/18   Samuella Cota, MD  ondansetron (ZOFRAN ODT) 8 MG disintegrating tablet Take 1 tablet (8 mg total) by mouth every 8 (eight) hours as needed for nausea or vomiting. 09/05/18   Jola Schmidt, MD  ondansetron (ZOFRAN) 4 MG tablet Take 1 tablet (4 mg total) by mouth every 6 (six) hours.  08/22/18   Terrilee FilesButler, Michael C, MD  pantoprazole (PROTONIX) 20 MG tablet Take 2 tablets (40 mg total) by mouth daily. 07/29/18   Mesner, Barbara CowerJason, MD  potassium chloride SA (K-DUR,KLOR-CON) 20 MEQ tablet Take 2 tablets (40 mEq total) by mouth 2 (two) times daily. 04/19/18   Bethel BornGekas, Kelly Marie, PA-C  spironolactone (ALDACTONE) 25 MG tablet Take 1 tablet (25 mg total) by mouth daily for 30 days. 06/27/18 08/10/18  Sabas SousBero, Michael M, MD  thiamine 100 MG tablet Take 1 tablet (100 mg total) by mouth daily. 08/21/18   Standley BrookingGoodrich, Daniel P, MD    Family History Family History  Problem Relation Age of Onset  . Arrhythmia Mother   . Neuropathy Mother   . Coronary artery disease Mother   . Heart disease Mother   . Heart disease Father   . Hypertension Father   . Heart attack Father   . Multiple sclerosis Maternal Aunt     Social History Social History   Tobacco Use  . Smoking status: Current  Every Day Smoker    Packs/day: 1.00    Years: 36.00    Pack years: 36.00    Types: Cigarettes  . Smokeless tobacco: Never Used  Substance Use Topics  . Alcohol use: Yes    Comment: Chronic ETOH abuse  . Drug use: No     Allergies   Patient has no known allergies.   Review of Systems Review of Systems  Unable to perform ROS: Mental status change  Gastrointestinal: Positive for abdominal distention and abdominal pain.     Physical Exam Updated Vital Signs BP (!) 109/55   Pulse 84   Temp 98.2 F (36.8 C)   Resp 13   SpO2 100%   Physical Exam Vitals signs and nursing note reviewed.  Constitutional:      Appearance: She is well-developed.     Comments: Drowsy but arousable.  Disheveled  HENT:     Head: Normocephalic and atraumatic.     Nose: Nose normal.     Mouth/Throat:     Mouth: Mucous membranes are moist.  Eyes:     Extraocular Movements: Extraocular movements intact.     Pupils: Pupils are equal, round, and reactive to light.  Neck:     Musculoskeletal: Normal range of motion and neck supple. No neck rigidity or muscular tenderness.  Cardiovascular:     Rate and Rhythm: Normal rate and regular rhythm.     Heart sounds: No murmur. No friction rub. No gallop.   Pulmonary:     Effort: Pulmonary effort is normal.     Breath sounds: Normal breath sounds.  Abdominal:     General: Bowel sounds are normal. There is distension.     Palpations: Abdomen is soft.     Tenderness: There is no abdominal tenderness. There is no guarding or rebound.     Comments: Abdomen is diffusely distended.  Patient has a small abrasion in the periumbilical region with mild surrounding erythema.  Does not appear to be tender to palpation.  Musculoskeletal: Normal range of motion.        General: No swelling, tenderness, deformity or signs of injury.     Right lower leg: No edema.     Left lower leg: No edema.  Lymphadenopathy:     Cervical: No cervical adenopathy.  Skin:     General: Skin is warm and dry.     Findings: No erythema or rash.  Neurological:     Comments: Drowsy  but arousable.  Follows simple commands.  5/5 motor in all extremities.  No obvious asterixis.      ED Treatments / Results  Labs (all labs ordered are listed, but only abnormal results are displayed) Labs Reviewed  CBC WITH DIFFERENTIAL/PLATELET - Abnormal; Notable for the following components:      Result Value   WBC 3.0 (*)    RBC 3.02 (*)    Hemoglobin 8.3 (*)    HCT 27.3 (*)    RDW 23.2 (*)    Platelets 83 (*)    All other components within normal limits  COMPREHENSIVE METABOLIC PANEL - Abnormal; Notable for the following components:   Sodium 134 (*)    Potassium 2.7 (*)    Chloride 89 (*)    CO2 37 (*)    BUN <5 (*)    Creatinine, Ser 0.37 (*)    Calcium 7.5 (*)    Total Protein 6.0 (*)    Albumin 2.6 (*)    Total Bilirubin 3.1 (*)    All other components within normal limits  LIPASE, BLOOD - Abnormal; Notable for the following components:   Lipase 55 (*)    All other components within normal limits  AMMONIA - Abnormal; Notable for the following components:   Ammonia 61 (*)    All other components within normal limits  ETHANOL - Abnormal; Notable for the following components:   Alcohol, Ethyl (B) 163 (*)    All other components within normal limits  SARS CORONAVIRUS 2  RAPID URINE DRUG SCREEN, HOSP PERFORMED    EKG None  Radiology No results found.  Procedures Procedures (including critical care time)  Medications Ordered in ED Medications  potassium chloride 10 mEq in 100 mL IVPB (10 mEq Intravenous New Bag/Given 09/08/18 1823)     Initial Impression / Assessment and Plan / ED Course  I have reviewed the triage vital signs and the nursing notes.  Pertinent labs & imaging results that were available during my care of the patient were reviewed by me and considered in my medical decision making (see chart for details).       Patient appears to  have reaccumulated ascites which is likely causing her abdominal discomfort.  Low suspicion for SBP.  Patient is mildly intoxicated.  She also has hypokalemic and have initiated potassium replacement in the emergency department.  Discussed with hospitalist who will see patient emergency department and admit.   Final Clinical Impressions(s) / ED Diagnoses   Final diagnoses:  Abdominal distension  Hypokalemia  Alcoholic intoxication without complication [facility])    ED Discharge Orders    None       Loren Racer, MD 09/10/18 2223

## 2018-09-09 ENCOUNTER — Inpatient Hospital Stay (HOSPITAL_COMMUNITY): Payer: Medicare Other

## 2018-09-09 DIAGNOSIS — K7040 Alcoholic hepatic failure without coma: Secondary | ICD-10-CM | POA: Diagnosis present

## 2018-09-09 DIAGNOSIS — I8510 Secondary esophageal varices without bleeding: Secondary | ICD-10-CM | POA: Diagnosis present

## 2018-09-09 DIAGNOSIS — X58XXXA Exposure to other specified factors, initial encounter: Secondary | ICD-10-CM | POA: Diagnosis present

## 2018-09-09 DIAGNOSIS — G35 Multiple sclerosis: Secondary | ICD-10-CM | POA: Diagnosis present

## 2018-09-09 DIAGNOSIS — J449 Chronic obstructive pulmonary disease, unspecified: Secondary | ICD-10-CM | POA: Diagnosis present

## 2018-09-09 DIAGNOSIS — E43 Unspecified severe protein-calorie malnutrition: Secondary | ICD-10-CM | POA: Diagnosis present

## 2018-09-09 DIAGNOSIS — K852 Alcohol induced acute pancreatitis without necrosis or infection: Secondary | ICD-10-CM | POA: Diagnosis present

## 2018-09-09 DIAGNOSIS — Z9981 Dependence on supplemental oxygen: Secondary | ICD-10-CM | POA: Diagnosis not present

## 2018-09-09 DIAGNOSIS — E871 Hypo-osmolality and hyponatremia: Secondary | ICD-10-CM | POA: Diagnosis present

## 2018-09-09 DIAGNOSIS — Z6830 Body mass index (BMI) 30.0-30.9, adult: Secondary | ICD-10-CM | POA: Diagnosis not present

## 2018-09-09 DIAGNOSIS — Z20828 Contact with and (suspected) exposure to other viral communicable diseases: Secondary | ICD-10-CM | POA: Diagnosis present

## 2018-09-09 DIAGNOSIS — D638 Anemia in other chronic diseases classified elsewhere: Secondary | ICD-10-CM | POA: Diagnosis present

## 2018-09-09 DIAGNOSIS — G609 Hereditary and idiopathic neuropathy, unspecified: Secondary | ICD-10-CM | POA: Diagnosis present

## 2018-09-09 DIAGNOSIS — F419 Anxiety disorder, unspecified: Secondary | ICD-10-CM | POA: Diagnosis present

## 2018-09-09 DIAGNOSIS — F17210 Nicotine dependence, cigarettes, uncomplicated: Secondary | ICD-10-CM | POA: Diagnosis present

## 2018-09-09 DIAGNOSIS — Y906 Blood alcohol level of 120-199 mg/100 ml: Secondary | ICD-10-CM | POA: Diagnosis present

## 2018-09-09 DIAGNOSIS — S30811A Abrasion of abdominal wall, initial encounter: Secondary | ICD-10-CM | POA: Diagnosis present

## 2018-09-09 DIAGNOSIS — K219 Gastro-esophageal reflux disease without esophagitis: Secondary | ICD-10-CM | POA: Diagnosis present

## 2018-09-09 DIAGNOSIS — E876 Hypokalemia: Secondary | ICD-10-CM | POA: Diagnosis present

## 2018-09-09 DIAGNOSIS — E7220 Disorder of urea cycle metabolism, unspecified: Secondary | ICD-10-CM | POA: Diagnosis present

## 2018-09-09 DIAGNOSIS — F10229 Alcohol dependence with intoxication, unspecified: Secondary | ICD-10-CM | POA: Diagnosis present

## 2018-09-09 DIAGNOSIS — J9611 Chronic respiratory failure with hypoxia: Secondary | ICD-10-CM | POA: Diagnosis present

## 2018-09-09 DIAGNOSIS — M810 Age-related osteoporosis without current pathological fracture: Secondary | ICD-10-CM | POA: Diagnosis present

## 2018-09-09 DIAGNOSIS — K7031 Alcoholic cirrhosis of liver with ascites: Secondary | ICD-10-CM | POA: Diagnosis present

## 2018-09-09 DIAGNOSIS — D61818 Other pancytopenia: Secondary | ICD-10-CM | POA: Diagnosis present

## 2018-09-09 DIAGNOSIS — R1084 Generalized abdominal pain: Secondary | ICD-10-CM | POA: Diagnosis present

## 2018-09-09 LAB — COMPREHENSIVE METABOLIC PANEL
ALT: 14 U/L (ref 0–44)
AST: 56 U/L — ABNORMAL HIGH (ref 15–41)
Albumin: 2.5 g/dL — ABNORMAL LOW (ref 3.5–5.0)
Alkaline Phosphatase: 134 U/L — ABNORMAL HIGH (ref 38–126)
Anion gap: 9 (ref 5–15)
BUN: <5 mg/dL — ABNORMAL LOW (ref 6–20)
CO2: 35 mmol/L — ABNORMAL HIGH (ref 22–32)
Calcium: 7.8 mg/dL — ABNORMAL LOW (ref 8.9–10.3)
Chloride: 93 mmol/L — ABNORMAL LOW (ref 98–111)
Creatinine, Ser: 0.38 mg/dL — ABNORMAL LOW (ref 0.44–1.00)
GFR calc Af Amer: >60 mL/min (ref >60)
GFR calc non Af Amer: >60 mL/min (ref >60)
Glucose, Bld: 83 mg/dL (ref 70–99)
Potassium: 4 mmol/L (ref 3.5–5.1)
Sodium: 137 mmol/L (ref 135–145)
Total Bilirubin: 3.3 mg/dL — ABNORMAL HIGH (ref 0.3–1.2)
Total Protein: 5.9 g/dL — ABNORMAL LOW (ref 6.5–8.1)

## 2018-09-09 LAB — CBC
HCT: 29.7 % — ABNORMAL LOW (ref 36.0–46.0)
Hemoglobin: 8.8 g/dL — ABNORMAL LOW (ref 12.0–15.0)
MCH: 27.5 pg (ref 26.0–34.0)
MCHC: 29.6 g/dL — ABNORMAL LOW (ref 30.0–36.0)
MCV: 92.8 fL (ref 80.0–100.0)
Platelets: 71 10*3/uL — ABNORMAL LOW (ref 150–400)
RBC: 3.2 MIL/uL — ABNORMAL LOW (ref 3.87–5.11)
RDW: 23.3 % — ABNORMAL HIGH (ref 11.5–15.5)
WBC: 3.4 10*3/uL — ABNORMAL LOW (ref 4.0–10.5)
nRBC: 0 % (ref 0.0–0.2)

## 2018-09-09 LAB — AMMONIA: Ammonia: 56 umol/L — ABNORMAL HIGH (ref 9–35)

## 2018-09-09 LAB — LIPASE, BLOOD: Lipase: 42 U/L (ref 11–51)

## 2018-09-09 MED ORDER — HYDROXYZINE HCL 25 MG PO TABS
25.0000 mg | ORAL_TABLET | Freq: Four times a day (QID) | ORAL | Status: DC | PRN
  Administered 2018-09-11 – 2018-09-13 (×3): 25 mg via ORAL
  Filled 2018-09-09 (×3): qty 1

## 2018-09-09 MED ORDER — LIDOCAINE HCL 1 % IJ SOLN
INTRAMUSCULAR | Status: AC
  Filled 2018-09-09: qty 10

## 2018-09-09 MED ORDER — LORAZEPAM 1 MG PO TABS
1.0000 mg | ORAL_TABLET | ORAL | Status: DC | PRN
  Administered 2018-09-09 – 2018-09-12 (×2): 1 mg via ORAL
  Filled 2018-09-09 (×3): qty 1

## 2018-09-09 MED ORDER — TRAMADOL HCL 50 MG PO TABS
50.0000 mg | ORAL_TABLET | Freq: Four times a day (QID) | ORAL | Status: DC | PRN
  Administered 2018-09-09 – 2018-09-13 (×7): 50 mg via ORAL
  Filled 2018-09-09 (×7): qty 1

## 2018-09-09 MED ORDER — LORAZEPAM 2 MG/ML IJ SOLN
1.0000 mg | Freq: Four times a day (QID) | INTRAMUSCULAR | Status: DC | PRN
  Administered 2018-09-12 – 2018-09-13 (×2): 1 mg via INTRAVENOUS
  Filled 2018-09-09 (×2): qty 1

## 2018-09-09 NOTE — Progress Notes (Signed)
PROGRESS NOTE    Anne Black  ZOX:[id number] DOB: 14-Nov-1960 DOA: 09/08/2018 PCP: Patient, No Pcp Per  Brief Narrative:  Anne Black  is a 58 y.o. female,  Bipolar, ADD, Multiple Sclerosis, PAfib, Etoh dep, Alcholic cirrhosis w esophageal varices, and ascites, w h/o paracentesis, Lung nodule 5mm (inferior lingular), , Gerd, presents with c/o abdominal pain which is mostly epigastric in nature for the past 2 days.  Patient states that she has some nausea but no vomiting. Her alcohol level was elevated. She was admitted for alcohol intoxication.     Assessment & Plan:   Principal Problem:   Abdominal pain Active Problems:   Depression   GERD (gastroesophageal reflux disease)   Tobacco abuse   Alcohol use disorder, moderate, dependence (HCC)   Anxiety   Protein-calorie malnutrition, severe (HCC)   Nausea, vomiting possibly from alcohol intoxication.  On CIWA AT this time.    Abdominal discomfort:  Possibly from ascites. US paracentesis ordered.      Liver cirrhosis leading to ascites:  Resume lasix and spironolactone.  US paracentesis ordered.   GERD Resume PPI.    Severe protein calorie malnutrition:  Resume prostat.   Hyperammonemia:  Continue with lactulose.    DVT prophylaxis: scd's Code Status: full code.  Family Communication: discussed with husband over the phone.  Disposition Plan: pending clinical improvement.    Consultants:   None.    Procedures: US paracentesis.    Antimicrobials: Rocephin.    Subjective: Wants to go home.   Objective: Vitals:   09/09/18 1300 09/09/18 1310 09/09/18 1320 09/09/18 1548  BP: 115/66 (!) 109/53 (!) 104/55   Pulse:    96  Resp:      Temp:      TempSrc:      SpO2:      Weight:      Height:        Intake/Output Summary (Last 24 hours) at 09/09/2018 1752 Last data filed at 09/09/2018 1715 Gross per 24 hour  Intake 1400 ml  Output 825 ml  Net 575 ml   Filed Weights   09/08/18 2310 09/09/18 0428   Weight: 82.1 kg 82.6 kg    Examination:  General exam: disheveled appearance.  Respiratory system: Clear to auscultation. Respiratory effort normal. Cardiovascular system: S1 & S2 heard, RRR.  Gastrointestinal system: Abdomen is soft NT ND BS+ Central nervous system: Alert BUT CONFUSED.  Skin: No rashes, lesions or ulcers Psychiatry:  Mood & affect appropriate.     Data Reviewed: I have personally reviewed following labs and imaging studies  CBC: Recent Labs  Lab 09/07/18 0738 09/08/18 1642 09/09/18 0619  WBC 2.9* 3.0* 3.4*  NEUTROABS  --  1.7  --   HGB 9.1* 8.3* 8.8*  HCT 30.4* 27.3* 29.7*  MCV 89.4 90.4 92.8  PLT 99* 83* 71*   Basic Metabolic Panel: Recent Labs  Lab 09/07/18 0738 09/08/18 1642 09/09/18 0619  NA 136 134* 137  K 2.6* 2.7* 4.0  CL 88* 89* 93*  CO2 38* 37* 35*  GLUCOSE 93 97 83  BUN <5* <5* <5*  CREATININE 0.48 0.37* 0.38*  CALCIUM 7.6* 7.5* 7.8*  MG 1.9  --   --    GFR: Estimated Creatinine Clearance: 76.4 mL/min (A) (by C-G formula based on SCr of 0.38 mg/dL (L)). Liver Function Tests: Recent Labs  Lab 09/07/18 0738 09/08/18 1642 09/09/18 0619  AST 41 37 56*  ALT 14 15 14   ALKPHOS 130* 124 134*  BILITOT  3.5* 3.1* 3.3*  PROT 6.2* 6.0* 5.9*  ALBUMIN 2.7* 2.6* 2.5*   Recent Labs  Lab 09/08/18 1642 09/09/18 0619  LIPASE 55* 42   Recent Labs  Lab 09/08/18 1642 09/09/18 1022  AMMONIA 61* 56*   Coagulation Profile: No results for input(s): INR, PROTIME in the last 168 hours. Cardiac Enzymes: No results for input(s): CKTOTAL, CKMB, CKMBINDEX, TROPONINI in the last 168 hours. BNP (last 3 results) No results for input(s): PROBNP in the last 8760 hours. HbA1C: No results for input(s): HGBA1C in the last 72 hours. CBG: No results for input(s): GLUCAP in the last 168 hours. Lipid Profile: No results for input(s): CHOL, HDL, LDLCALC, TRIG, CHOLHDL, LDLDIRECT in the last 72 hours. Thyroid Function Tests: No results for input(s):  TSH, T4TOTAL, FREET4, T3FREE, THYROIDAB in the last 72 hours. Anemia Panel: No results for input(s): VITAMINB12, FOLATE, FERRITIN, TIBC, IRON, RETICCTPCT in the last 72 hours. Sepsis Labs: No results for input(s): PROCALCITON, LATICACIDVEN in the last 168 hours.  Recent Results (from the past 240 hour(s))  SARS Coronavirus 2 (CEPHEID - Performed in [place] hospital lab), Hosp Order     Status: None   Collection Time: 09/07/18  9:26 AM   Specimen: Nasopharyngeal Swab  Result Value Ref Range Status   SARS Coronavirus 2 NEGATIVE NEGATIVE Final    Comment: (NOTE) If result is NEGATIVE SARS-CoV-2 target nucleic acids are NOT DETECTED. The SARS-CoV-2 RNA is generally detectable in upper and lower  respiratory specimens during the acute phase of infection. The lowest  concentration of SARS-CoV-2 viral copies this assay can detect is 250  copies / mL. A negative result does not preclude SARS-CoV-2 infection  and should not be used as the sole basis for treatment or other  patient management decisions.  A negative result may occur with  improper specimen collection / handling, submission of specimen other  than nasopharyngeal swab, presence of viral mutation(s) within the  areas targeted by this assay, and inadequate number of viral copies  (<250 copies / mL). A negative result must be combined with clinical  observations, patient history, and epidemiological information. If result is POSITIVE SARS-CoV-2 target nucleic acids are DETECTED. The SARS-CoV-2 RNA is generally detectable in upper and lower  respiratory specimens dur ing the acute phase of infection.  Positive  results are indicative of active infection with SARS-CoV-2.  Clinical  correlation with patient history and other diagnostic information is  necessary to determine patient infection status.  Positive results do  not rule out bacterial infection or co-infection with other viruses. If result is PRESUMPTIVE POSTIVE  SARS-CoV-2 nucleic acids MAY BE PRESENT.   A presumptive positive result was obtained on the submitted specimen  and confirmed on repeat testing.  While 2019 novel coronavirus  (SARS-CoV-2) nucleic acids may be present in the submitted sample  additional confirmatory testing may be necessary for epidemiological  and / or clinical management purposes  to differentiate between  SARS-CoV-2 and other Sarbecovirus currently known to infect humans.  If clinically indicated additional testing with an alternate test  methodology [phone]) is advised. The SARS-CoV-2 RNA is generally  detectable in upper and lower respiratory sp ecimens during the acute  phase of infection. The expected result is Negative. Fact Sheet for Patients:  StrictlyIdeas.no Fact Sheet for Healthcare Providers: BankingDealers.co.za This test is not yet approved or cleared by the [place] FDA and has been authorized for detection and/or diagnosis of SARS-CoV-2 by FDA under an Emergency Use Authorization (EUA).  This EUA will remain in effect (meaning this test can be used) for the duration of the COVID-19 declaration under Section 564(b)(1) of the Act, 21 U.S.C. section 360bbb-3(b)(1), unless the authorization is terminated or revoked sooner. Performed at [facility], [address] [place], [place] [address]   SARS Coronavirus 2 [facility](Hospital order, Performed in [facility] lab)     Status: None   Collection Time: 09/08/18  7:45 PM  Result Value Ref Range Status   SARS Coronavirus 2 NEGATIVE NEGATIVE Final    Comment: (NOTE) If result is NEGATIVE SARS-CoV-2 target nucleic acids are NOT DETECTED. The SARS-CoV-2 RNA is generally detectable in upper and lower  respiratory specimens during the acute phase of infection. The lowest  concentration of SARS-CoV-2 viral copies this assay can detect is 250  copies / mL. A negative result does not  preclude SARS-CoV-2 infection  and should not be used as the sole basis for treatment or other  patient management decisions.  A negative result may occur with  improper specimen collection / handling, submission of specimen other  than nasopharyngeal swab, presence of viral mutation(s) within the  areas targeted by this assay, and inadequate number of viral copies  (<250 copies / mL). A negative result must be combined with clinical  observations, patient history, and epidemiological information. If result is POSITIVE SARS-CoV-2 target nucleic acids are DETECTED. The SARS-CoV-2 RNA is generally detectable in upper and lower  respiratory specimens dur ing the acute phase of infection.  Positive  results are indicative of active infection with SARS-CoV-2.  Clinical  correlation with patient history and other diagnostic information is  necessary to determine patient infection status.  Positive results do  not rule out bacterial infection or co-infection with other viruses. If result is PRESUMPTIVE POSTIVE SARS-CoV-2 nucleic acids MAY BE PRESENT.   A presumptive positive result was obtained on the submitted specimen  and confirmed on repeat testing.  While 2019 novel coronavirus  (SARS-CoV-2) nucleic acids may be present in the submitted sample  additional confirmatory testing may be necessary for epidemiological  and / or clinical management purposes  to differentiate between  SARS-CoV-2 and other Sarbecovirus currently known to infect humans.  If clinically indicated additional testing with an alternate test  methodology [phone]([id number]) is advised. The SARS-CoV-2 RNA is generally  detectable in upper and lower respiratory sp ecimens during the acute  phase of infection. The expected result is Negative. Fact Sheet for Patients:  BoilerBrush.com.cyhttps://www.fda.gov/media/136312/download Fact Sheet for Healthcare Providers: https://pope.com/https://www.fda.gov/media/136313/download This test is not yet approved or cleared by  the [place] FDA and has been authorized for detection and/or diagnosis of SARS-CoV-2 by FDA under an Emergency Use Authorization (EUA).  This EUA will remain in effect (meaning this test can be used) for the duration of the COVID-19 declaration under Section 564(b)(1) of the Act, 21 U.S.C. section 360bbb-3(b)(1), unless the authorization is terminated or revoked sooner. Performed at [facility], [address] [place], [place] [address]          Radiology Studies: [name] Paracentesis  Result Date: 09/09/2018 INDICATION: Cirrhosis, abdominal distension, recurrent ascites EXAM: ULTRASOUND GUIDED THERAPEUTIC PARACENTESIS MEDICATIONS: 1% lidocaine local COMPLICATIONS: None immediate. PROCEDURE: Informed written consent was obtained from the patient after a discussion of the risks, benefits and alternatives to treatment. A timeout was performed prior to the initiation of the procedure. Initial ultrasound scanning demonstrates a large amount of ascites within the right lower abdominal quadrant. The right lower  abdomen was prepped and draped in the usual sterile fashion. 1% lidocaine with epinephrine was used for local anesthesia. Following this, a 6 Fr Safe-T-Centesis catheter was introduced. An ultrasound image was saved for documentation purposes. The paracentesis was performed. The catheter was removed and a dressing was applied. The patient tolerated the procedure well without immediate post procedural complication. FINDINGS: A total of approximately 6.6 L of clear peritoneal fluid was removed. Sample was not sent for laboratory analysis IMPRESSION: Successful ultrasound-guided paracentesis yielding 6.6 liters of peritoneal fluid. Electronically Signed   By: Judie PetitM.  Shick M.D.   On: 09/09/2018 13:32        Scheduled Meds: . chlordiazePOXIDE  25 mg Oral Once  . feeding supplement (PRO-STAT SUGAR FREE 64)  30 mL Oral BID  . folic acid  1 mg Oral Daily  . furosemide  40 mg  Oral Daily  . gabapentin  200 mg Oral TID  . lactulose  20 g Oral BID  . lidocaine      . LORazepam  0-4 mg Intravenous Q6H   Followed by  . [START ON 09/11/2018] LORazepam  0-4 mg Intravenous Q12H  . multivitamin with minerals  1 tablet Oral Daily  . pantoprazole  40 mg Oral Daily  . potassium chloride SA  40 mEq Oral BID  . sodium chloride flush  3 mL Intravenous Q12H  . spironolactone  25 mg Oral Daily  . thiamine  100 mg Oral Daily   Or  . thiamine  100 mg Intravenous Daily   Continuous Infusions: . sodium chloride       LOS: 0 days    Time spent: 32 minutes.     Kathlen ModyVijaya Lucciano Vitali, MD [facility] Pager [phone]  If 7PM-7AM, please contact night-coverage [email or web address] Password [email or web address] 09/09/2018, 5:52 PM

## 2018-09-09 NOTE — TOC Progression Note (Signed)
Transition of Care [facility]) - Progression Note    Patient Details  Name: Anne Black MRN: [id number] Date of Birth: Jun 06, 1960  Transition of [facility] CM/SW Contact  Ross Ludwig, [place] Phone Number: 09/09/2018, 3:01 PM  Clinical Narrative:     CSW received consult that the physician was wanting to IVC patient.  CSW spoke to the physician and she said patient has now agreed to stay.  She has cancelled the IVC.  CSW signing off, please reconsult if social work needs arise.        Expected Discharge Plan and Services                                                 Social Determinants of Health (SDOH) Interventions    Readmission Risk Interventions No flowsheet data found.

## 2018-09-10 NOTE — Evaluation (Signed)
Occupational Therapy Evaluation Patient Details Name: Anne Black MRN: [id number] DOB: 1960/02/10 Today's Date: 09/10/2018    History of Present Illness Ptis a57 y.o.female,Bipolar, ADD, Multiple Sclerosis, PAfib, Etoh dep, Alcholic cirrhosis w esophageal varices, and ascites, w h/o paracentesis, Lung nodule 5mm (inferior lingular), Gerd, on home O2 (2-3L) presents with c/o abdominal pain for the past 2 days. Patient states that she has some nausea but no vomiting. Her alcohol level was elevated.   Clinical Impression   [facility] and required assist for ADL (history of self-neglect) and using RW with assist. Pt today is min A for SPT to BSC, set up to mod A for UB ADL, max A for LB ADL. Pt presenting with decreased cognition and safety awareness. Pt will require post-acute care at SNF - [facility] to maximize safety and independence in ADL and functional transfers.    Follow Up Recommendations  SNF(wants [facility] or [facility])    Equipment Recommendations       Recommendations for Other Services Other (comment)(Palliative Medicine)     Precautions / Restrictions Precautions Precautions: Fall Precaution Comments: on 2-3L O2 at baseline Restrictions Weight Bearing Restrictions: No      Mobility Bed Mobility Overal bed mobility: Needs Assistance Bed Mobility: Supine to Sit;Sit to Supine     Supine to sit: Supervision Sit to supine: Min guard   General bed mobility comments: increased time and effort  Transfers Overall transfer level: Needs assistance Equipment used: 1 person hand held assist Transfers: Stand Pivot Transfers   Stand pivot transfers: Min assist       General transfer comment: min A for boost and balance as well as line management    Balance Overall balance assessment: Needs assistance Sitting-balance support: Feet supported;No upper extremity supported Sitting balance-Leahy Scale: Fair Sitting balance -  Comments: min guard to close supervision for safety statically    Standing balance support: Single extremity supported Standing balance-Leahy Scale: Poor                             ADL either performed or assessed with clinical judgement   ADL Overall ADL's : Needs assistance/impaired Eating/Feeding: Set up;Sitting   Grooming: Wash/dry hands;Wash/dry face;Minimal assistance;Sitting;Brushing hair;Maximal assistance(max A to brush hair due to matting in the back)   Upper Body Bathing: Moderate assistance;Sitting   Lower Body Bathing: Maximal assistance   Upper Body Dressing : Moderate assistance   Lower Body Dressing: Maximal assistance;Sitting/lateral leans   Toilet Transfer: Minimal assistance;Stand-pivot;BSC Toilet Transfer Details (indicate cue type and reason): Pt stating "I want a bed-pan! this isn't safe" but made it without LOB with min support from therapist Toileting- Clothing Manipulation and Hygiene: Maximal assistance;Sit to/from stand Toileting - Clothing Manipulation Details (indicate cue type and reason): max A for clothing management including mesh underwear. Pt required encouragement to perform peri care - OT follow through for thoroughness     Functional mobility during ADLs: Minimal assistance;Cueing for safety General ADL Comments: Pt limited by cognition, balance, activity tolerance. Pt also with history of self-neglect/self-limiting     Vision   Additional Comments: Pt prefers to keep eyes closed throughout session     Perception     Praxis      Pertinent Vitals/Pain Pain Assessment: Faces Faces Pain Scale: Hurts a little bit Pain Location: bottom sitting on BSC Pain Descriptors / Indicators: Sore Pain Intervention(s): Limited activity within patient's tolerance;Monitored during session;Repositioned  Hand Dominance Right   Extremity/Trunk Assessment Upper Extremity Assessment Upper Extremity Assessment: Generalized weakness    Lower Extremity Assessment Lower Extremity Assessment: Defer to PT evaluation   Cervical / Trunk Assessment Cervical / Trunk Exceptions: large body habitus   Communication Communication Communication: No difficulties   Cognition Arousal/Alertness: Awake/alert Behavior During Therapy: Flat affect Overall Cognitive Status: Impaired/Different from baseline Area of Impairment: Safety/judgement;Awareness;Problem solving;Following commands                       Following Commands: Follows one step commands with increased time Safety/Judgement: Decreased awareness of safety Awareness: Emergent Problem Solving: Requires verbal cues;Slow processing General Comments: Pt confused and self-limiting   General Comments  Pt stating she needs a note for court tomorrow    Exercises     Shoulder Instructions      Home Living Family/patient expects to be discharged to:: Private residence Living Arrangements: Alone Available Help at Discharge: Available PRN/intermittently;Family Type of Home: House Home Access: Stairs to enter [employer] of Steps: 1 Entrance Stairs-Rails: Right Home Layout: Two level;Able to live on main level with bedroom/bathroom     Bathroom Shower/Tub: Producer, television/film/video: Standard Bathroom Accessibility: Yes How Accessible: Accessible via walker Home Equipment: Walker - 2 wheels;Cane - single point;Wheelchair - Sport and exercise psychologist Comments: pt able to provide information that agrees with prior admission       Prior Functioning/Environment Level of Independence: Needs assistance  Gait / Transfers Assistance Needed: uses RW for ambulation ADL's / Homemaking Assistance Needed: cannot perform independently, requires assist but does not have it   Comments: history of self neglect and etoh abuse        OT Problem List: Decreased strength;Decreased activity tolerance;Impaired balance (sitting and/or  standing);Impaired vision/perception;Decreased safety awareness;Decreased cognition;Decreased coordination;Decreased knowledge of use of DME or AE;Pain      OT Treatment/Interventions: Self-care/ADL training;Therapeutic exercise;Neuromuscular education;Energy conservation;DME and/or AE instruction;Therapeutic activities;Patient/family education;Balance training    OT Goals(Current goals can be found in the care plan section) Acute Rehab OT Goals Patient Stated Goal: get to [facility] Goal Formulation: With patient Time For Goal Achievement: 09/24/18 Potential to Achieve Goals: Fair  OT Frequency: Min 2X/week   Barriers to D/C: Decreased caregiver support  family not available 24/7       Co-evaluation              AM-PAC OT "6 Clicks" Daily Activity     Outcome Measure Help from another person eating meals?: None Help from another person taking care of personal grooming?: A Little Help from another person toileting, which includes using toliet, bedpan, or urinal?: A Lot Help from another person bathing (including washing, rinsing, drying)?: A Lot Help from another person to put on and taking off regular upper body clothing?: A Little Help from another person to put on and taking off regular lower body clothing?: A Lot 6 Click Score: 16   End of Session Equipment Utilized During Treatment: Gait belt;Oxygen(2L`) Nurse Communication: Mobility status  Activity Tolerance: Patient tolerated treatment well Patient left: in bed;with call bell/phone within reach;with bed alarm set  OT Visit Diagnosis: Unsteadiness on feet (R26.81);Muscle weakness (generalized) (M62.81);Adult, failure to thrive (R62.7)                Time: 1420-1450 OT Time Calculation (min): 30 min Charges:  OT General Charges $OT Visit: 1 Visit OT Evaluation $OT Eval Moderate Complexity: 1 Mod OT Treatments $  Self Care/Home Management : 8-22 mins  Hulda Humphrey OTR/L Acute Rehabilitation  Services Pager: [phone] Office: [place] 09/10/2018, 3:07 PM

## 2018-09-10 NOTE — Evaluation (Addendum)
Physical Therapy Evaluation Patient Details Name: Anne Black MRN: [id number] DOB: May 18, 1960 Today's Date: 09/10/2018   History of Present Illness  58 yo female admitted with abd pain. Hx of multiple ED visits, ETOH cirrhosis, MS, ADD, bipolar d/o, COPD, A fib, chronic ascites  Clinical Impression  Bed level eval only. Pt declined OOB activity with PT on today. Discussed importance of mobilizing. Pt expressed a desire to go to a facility because she is having difficulty caring for herself at home. Will follow and progress activity as pt will allow. At this time, recommendation is for SNF.     Follow Up Recommendations SNF    Equipment Recommendations  None recommended by PT    Recommendations for Other Services       Precautions / Restrictions Precautions Precautions: Fall Precaution Comments: on 2-3L O2 at baseline Restrictions Weight Bearing Restrictions: No      Mobility  Bed Mobility Overal bed mobility: Needs Assistance Bed Mobility: Rolling Rolling: Min guard     General bed mobility comments: Increased time. Rolling to L and R sides. Pt declined OOB activity with PT on today.  Transfers       Ambulation/Gait                Stairs            Wheelchair Mobility    Modified Rankin (Stroke Patients Only)       Balance                               Pertinent Vitals/Pain Pain Assessment: Faces Faces Pain Scale: Hurts little more Pain Location: buttocks Pain Descriptors / Indicators: Discomfort;Sore Pain Intervention(s): Limited activity within patient's tolerance;Monitored during session    [place] expects to be discharged to:: Private residence Living Arrangements: Alone Available Help at Discharge: Available PRN/intermittently;Family Type of Home: House Home Access: Stairs to enter Entrance Stairs-Rails: Right Entrance Stairs-Number of Steps: 1 Home Layout: Two level;Able to live on main level  with bedroom/bathroom Home Equipment: Gilford Rile - 2 wheels;Cane - single point;Wheelchair - Sport and exercise psychologist Comments: pt able to provide information that agrees with prior admission     Prior Function Level of Independence: Needs assistance   Gait / Transfers Assistance Needed: uses RW for ambulation  ADL's / Homemaking Assistance Needed: cannot perform independently, requires assist but does not have it (per pt)  Comments: history of self neglect and etoh abuse     Hand Dominance   Dominant Hand: Right    Extremity/Trunk Assessment   Upper Extremity Assessment Upper Extremity Assessment: Defer to OT evaluation    Lower Extremity Assessment Lower Extremity Assessment: Generalized weakness    Cervical / Trunk Assessment Cervical / Trunk Exceptions: large body habitus  Communication   Communication: No difficulties  Cognition Arousal/Alertness: Awake/alert Behavior During Therapy: WFL for tasks assessed/performed Overall Cognitive Status: No family/caregiver present to determine baseline cognitive functioning Area of Impairment: Safety/judgement;Awareness;Problem solving;Following commands                       Following Commands: Follows one step commands with increased time Safety/Judgement: Decreased awareness of safety Awareness: Emergent Problem Solving: Requires verbal cues;Slow processing General Comments: Pt self limits activity      General Comments General comments (skin integrity, edema, etc.): Pt stating she needs a note for court tomorrow    Exercises     Assessment/Plan  PT Assessment Patient needs continued PT services  PT Problem List Decreased strength;Decreased mobility;Decreased cognition;Decreased balance;Decreased activity tolerance;Decreased safety awareness;Pain       PT Treatment Interventions DME instruction;Gait training;Therapeutic exercise;Therapeutic activities;Patient/family education;Balance  training;Functional mobility training    PT Goals (Current goals can be found in the Care Plan section)  Acute Rehab PT Goals Patient Stated Goal: to go to a facility PT Goal Formulation: With patient Time For Goal Achievement: 09/24/18 Potential to Achieve Goals: Fair    Frequency Min 2X/week   Barriers to discharge        Co-evaluation               AM-PAC PT "6 Clicks" Mobility  Outcome Measure Help needed turning from your back to your side while in a flat bed without using bedrails?: A Little Help needed moving from lying on your back to sitting on the side of a flat bed without using bedrails?: A Little Help needed moving to and from a bed to a chair (including a wheelchair)?: A Little Help needed standing up from a chair using your arms (e.g., wheelchair or bedside chair)?: A Little Help needed to walk in hospital room?: A Lot Help needed climbing 3-5 steps with a railing? : A Lot 6 Click Score: 16    End of Session Equipment Utilized During Treatment: Oxygen Activity Tolerance: Patient tolerated treatment well Patient left: in bed;with call bell/phone within reach;with bed alarm set   PT Visit Diagnosis: Muscle weakness (generalized) (M62.81);Unsteadiness on feet (R26.81);Repeated falls (R29.6);History of falling (Z91.81)    Time: [id number] PT Time Calculation (min) (ACUTE ONLY): 8 min   Charges:   PT Evaluation $PT Eval Moderate Complexity: 1 Mod           Rebeca AlertJannie Tiara Maultsby, PT Acute Rehabilitation Services Pager: [phone] Office: [phone]

## 2018-09-10 NOTE — Progress Notes (Signed)
PROGRESS NOTE    Anne Black  [id number]:[id number] DOB: May 31, 1960 DOA: 09/08/2018 PCP: Patient, No Pcp Per  Brief Narrative:  Anne Black  is a 58 y.o. female,  Bipolar, ADD, Multiple Sclerosis, PAfib, Etoh dep, Alcholic cirrhosis w esophageal varices, and ascites, w h/o paracentesis, Lung nodule 59mm (inferior lingular), , Gerd, presents with c/o abdominal pain which is mostly epigastric in nature for the past 2 days.  Patient states that she has some nausea but no vomiting. Her alcohol level was elevated. She was admitted for alcohol intoxication.     Assessment & Plan:   Principal Problem:   Abdominal pain Active Problems:   Depression   GERD (gastroesophageal reflux disease)   Tobacco abuse   Alcohol use disorder, moderate, dependence (HCC)   Anxiety   Protein-calorie malnutrition, severe (HCC)   Nausea, vomiting possibly from alcohol intoxication.  On CIWA at this time.  Improving.    Abdominal discomfort:  Possibly from ascites. US paracentesis ordered, about 6.6 liters of fluid taken out.  She reports abd discomfort has improved.   Hypokalemia replaced.    Acute hepatic encephalopathy:  Continue with lactulose , ammonia levels remain high.    Liver cirrhosis leading to ascites:  Resume lasix and spironolactone.  US paracentesis ordered.  Hold lasix and spironolactone for SBP less than 100.   GERD Resume PPI.    Anemia of chronic disease:  Hemoglobin stable around 8.    Severe protein calorie malnutrition:  Resume prostat.   Hyperammonemia:  Continue with lactulose.    DVT prophylaxis: scd's Code Status: full code.  Family Communication: discussed with husband over the phone.  Disposition Plan: pending clinical improvement. Discharges to SNF when bed available.    Consultants:   None.    Procedures: US paracentesis.    Antimicrobials: Rocephin.    Subjective: Alert and comfortable.   Objective: Vitals:   09/10/18 0036 09/10/18 0611  09/10/18 0613 09/10/18 1423  BP: (!) 101/58  (!) 95/56 (!) 97/52  Pulse: 88  84 80  Resp: 14  16 16   Temp: 98.3 F (36.8 C)  98.2 F (36.8 C) (!) 97.5 F (36.4 C)  TempSrc: Oral  Oral Oral  SpO2: 99%  100% 100%  Weight:  75.9 kg    Height:        Intake/Output Summary (Last 24 hours) at 09/10/2018 1911 Last data filed at 09/10/2018 1300 Gross per 24 hour  Intake 480 ml  Output 1150 ml  Net -670 ml   Filed Weights   09/08/18 2310 09/09/18 0428 09/10/18 0611  Weight: 82.1 kg 82.6 kg 75.9 kg    Examination:  General exam: disheveled appearance. Not in distress.  Respiratory system: Clear to auscultation. Respiratory effort normal. No wheezing or rhonchi.  Cardiovascular system: S1 & S2 heard, RRR.  Gastrointestinal system: Abdomen is soft NT ND BS+ Central nervous system: Alert , able to answer simple questions.  Skin: No rashes, lesions or ulcers Psychiatry:  Mood & affect appropriate.     Data Reviewed: I have personally reviewed following labs and imaging studies  CBC: Recent Labs  Lab 09/07/18 0738 09/08/18 1642 09/09/18 0619  WBC 2.9* 3.0* 3.4*  NEUTROABS  --  1.7  --   HGB 9.1* 8.3* 8.8*  HCT 30.4* 27.3* 29.7*  MCV 89.4 90.4 92.8  PLT 99* 83* 71*   Basic Metabolic Panel: Recent Labs  Lab 09/07/18 0738 09/08/18 1642 09/09/18 0619  NA 136 134* 137  K 2.6* 2.7* 4.0  CL 88* 89* 93*  CO2 38* 37* 35*  GLUCOSE 93 97 83  BUN <5* <5* <5*  CREATININE 0.48 0.37* 0.38*  CALCIUM 7.6* 7.5* 7.8*  MG 1.9  --   --    GFR: Estimated Creatinine Clearance: 73.1 mL/min (A) (by C-G formula based on SCr of 0.38 mg/dL (L)). Liver Function Tests: Recent Labs  Lab 09/07/18 0738 09/08/18 1642 09/09/18 0619  AST 41 37 56*  ALT 14 15 14   ALKPHOS 130* 124 134*  BILITOT 3.5* 3.1* 3.3*  PROT 6.2* 6.0* 5.9*  ALBUMIN 2.7* 2.6* 2.5*   Recent Labs  Lab 09/08/18 1642 09/09/18 0619  LIPASE 55* 42   Recent Labs  Lab 09/08/18 1642 09/09/18 1022  AMMONIA 61* 56*    Coagulation Profile: No results for input(s): INR, PROTIME in the last 168 hours. Cardiac Enzymes: No results for input(s): CKTOTAL, CKMB, CKMBINDEX, TROPONINI in the last 168 hours. BNP (last 3 results) No results for input(s): PROBNP in the last 8760 hours. HbA1C: No results for input(s): HGBA1C in the last 72 hours. CBG: No results for input(s): GLUCAP in the last 168 hours. Lipid Profile: No results for input(s): CHOL, HDL, LDLCALC, TRIG, CHOLHDL, LDLDIRECT in the last 72 hours. Thyroid Function Tests: No results for input(s): TSH, T4TOTAL, FREET4, T3FREE, THYROIDAB in the last 72 hours. Anemia Panel: No results for input(s): VITAMINB12, FOLATE, FERRITIN, TIBC, IRON, RETICCTPCT in the last 72 hours. Sepsis Labs: No results for input(s): PROCALCITON, LATICACIDVEN in the last 168 hours.  Recent Results (from the past 240 hour(s))  SARS Coronavirus 2 (CEPHEID - Performed in [facility] hospital lab), Hosp Order     Status: None   Collection Time: 09/07/18  9:26 AM   Specimen: Nasopharyngeal Swab  Result Value Ref Range Status   SARS Coronavirus 2 NEGATIVE NEGATIVE Final    Comment: (NOTE) If result is NEGATIVE SARS-CoV-2 target nucleic acids are NOT DETECTED. The SARS-CoV-2 RNA is generally detectable in upper and lower  respiratory specimens during the acute phase of infection. The lowest  concentration of SARS-CoV-2 viral copies this assay can detect is 250  copies / mL. A negative result does not preclude SARS-CoV-2 infection  and should not be used as the sole basis for treatment or other  patient management decisions.  A negative result may occur with  improper specimen collection / handling, submission of specimen other  than nasopharyngeal swab, presence of viral mutation(s) within the  areas targeted by this assay, and inadequate number of viral copies  (<250 copies / mL). A negative result must be combined with clinical  observations, patient history, and  epidemiological information. If result is POSITIVE SARS-CoV-2 target nucleic acids are DETECTED. The SARS-CoV-2 RNA is generally detectable in upper and lower  respiratory specimens dur ing the acute phase of infection.  Positive  results are indicative of active infection with SARS-CoV-2.  Clinical  correlation with patient history and other diagnostic information is  necessary to determine patient infection status.  Positive results do  not rule out bacterial infection or co-infection with other viruses. If result is PRESUMPTIVE POSTIVE SARS-CoV-2 nucleic acids MAY BE PRESENT.   A presumptive positive result was obtained on the submitted specimen  and confirmed on repeat testing.  While 2019 novel coronavirus  (SARS-CoV-2) nucleic acids may be present in the submitted sample  additional confirmatory testing may be necessary for epidemiological  and / or clinical management purposes  to differentiate between  SARS-CoV-2 and other Sarbecovirus currently known  to infect humans.  If clinically indicated additional testing with an alternate test  methodology [phone]([id number]) is advised. The SARS-CoV-2 RNA is generally  detectable in upper and lower respiratory sp ecimens during the acute  phase of infection. The expected result is Negative. Fact Sheet for Patients:  BoilerBrush.com.cyhttps://www.fda.gov/media/136312/download Fact Sheet for Healthcare Providers: https://pope.com/https://www.fda.gov/media/136313/download This test is not yet approved or cleared by the [place] FDA and has been authorized for detection and/or diagnosis of SARS-CoV-2 by FDA under an Emergency Use Authorization (EUA).  This EUA will remain in effect (meaning this test can be used) for the duration of the COVID-19 declaration under Section 564(b)(1) of the Act, 21 U.S.C. section 360bbb-3(b)(1), unless the authorization is terminated or revoked sooner. Performed at [facility], [address] [place], [place] [address]     SARS Coronavirus 2 [facility](Hospital order, Performed in [facility] hospital lab)     Status: None   Collection Time: 09/08/18  7:45 PM  Result Value Ref Range Status   SARS Coronavirus 2 NEGATIVE NEGATIVE Final    Comment: (NOTE) If result is NEGATIVE SARS-CoV-2 target nucleic acids are NOT DETECTED. The SARS-CoV-2 RNA is generally detectable in upper and lower  respiratory specimens during the acute phase of infection. The lowest  concentration of SARS-CoV-2 viral copies this assay can detect is 250  copies / mL. A negative result does not preclude SARS-CoV-2 infection  and should not be used as the sole basis for treatment or other  patient management decisions.  A negative result may occur with  improper specimen collection / handling, submission of specimen other  than nasopharyngeal swab, presence of viral mutation(s) within the  areas targeted by this assay, and inadequate number of viral copies  (<250 copies / mL). A negative result must be combined with clinical  observations, patient history, and epidemiological information. If result is POSITIVE SARS-CoV-2 target nucleic acids are DETECTED. The SARS-CoV-2 RNA is generally detectable in upper and lower  respiratory specimens dur ing the acute phase of infection.  Positive  results are indicative of active infection with SARS-CoV-2.  Clinical  correlation with patient history and other diagnostic information is  necessary to determine patient infection status.  Positive results do  not rule out bacterial infection or co-infection with other viruses. If result is PRESUMPTIVE POSTIVE SARS-CoV-2 nucleic acids MAY BE PRESENT.   A presumptive positive result was obtained on the submitted specimen  and confirmed on repeat testing.  While 2019 novel coronavirus  (SARS-CoV-2) nucleic acids may be present in the submitted sample  additional confirmatory testing may be necessary for epidemiological  and / or clinical management purposes   to differentiate between  SARS-CoV-2 and other Sarbecovirus currently known to infect humans.  If clinically indicated additional testing with an alternate test  methodology [phone]([id number]) is advised. The SARS-CoV-2 RNA is generally  detectable in upper and lower respiratory sp ecimens during the acute  phase of infection. The expected result is Negative. Fact Sheet for Patients:  BoilerBrush.com.cyhttps://www.fda.gov/media/136312/download Fact Sheet for Healthcare Providers: https://pope.com/https://www.fda.gov/media/136313/download This test is not yet approved or cleared by the [place] FDA and has been authorized for detection and/or diagnosis of SARS-CoV-2 by FDA under an Emergency Use Authorization (EUA).  This EUA will remain in effect (meaning this test can be used) for the duration of the COVID-19 declaration under Section 564(b)(1) of the Act, 21 U.S.C. section 360bbb-3(b)(1), unless the authorization is terminated or revoked sooner. Performed at [facility], [address], [place], [place] [address]          Radiology Studies: US Paracentesis  Result Date: 09/09/2018 INDICATION: Cirrhosis, abdominal distension, recurrent ascites EXAM: ULTRASOUND GUIDED THERAPEUTIC PARACENTESIS MEDICATIONS: 1% lidocaine local COMPLICATIONS: None immediate. PROCEDURE: Informed written consent was obtained from the patient after a discussion of the risks, benefits and alternatives to treatment. A timeout was performed prior to the initiation of the procedure. Initial ultrasound scanning demonstrates a large amount of ascites within the right lower abdominal quadrant. The right lower abdomen was prepped and draped in the usual sterile fashion. 1% lidocaine with epinephrine was used for local anesthesia. Following this, a 6 Fr Safe-T-Centesis catheter was introduced. An ultrasound image was saved for documentation purposes. The paracentesis was performed. The catheter was removed and a dressing was applied. The  patient tolerated the procedure well without immediate post procedural complication. FINDINGS: A total of approximately 6.6 L of clear peritoneal fluid was removed. Sample was not sent for laboratory analysis IMPRESSION: Successful ultrasound-guided paracentesis yielding 6.6 liters of peritoneal fluid. Electronically Signed   By: Jerilynn Mages.  Shick M.D.   On: 09/09/2018 13:32        Scheduled Meds:  feeding supplement (PRO-STAT SUGAR FREE 64)  30 mL Oral BID   folic acid  1 mg Oral Daily   furosemide  40 mg Oral Daily   gabapentin  200 mg Oral TID   lactulose  20 g Oral BID   LORazepam  0-4 mg Intravenous Q6H   Followed by   Derrill Memo ON 09/11/2018] LORazepam  0-4 mg Intravenous Q12H   multivitamin with minerals  1 tablet Oral Daily   pantoprazole  40 mg Oral Daily   potassium chloride SA  40 mEq Oral BID   sodium chloride flush  3 mL Intravenous Q12H   spironolactone  25 mg Oral Daily   thiamine  100 mg Oral Daily   Or   thiamine  100 mg Intravenous Daily   Continuous Infusions:  sodium chloride       LOS: 1 day    Time spent: 32 minutes.     Hosie Poisson, MD [facility] Pager [phone]  If 7PM-7AM, please contact night-coverage [email or web address] Password [email or web address] 09/10/2018, 7:11 PM

## 2018-09-11 LAB — COMPREHENSIVE METABOLIC PANEL
ALT: 12 U/L (ref 0–44)
AST: 38 U/L (ref 15–41)
Albumin: 2.2 g/dL — ABNORMAL LOW (ref 3.5–5.0)
Alkaline Phosphatase: 128 U/L — ABNORMAL HIGH (ref 38–126)
Anion gap: 9 (ref 5–15)
BUN: 7 mg/dL (ref 6–20)
CO2: 36 mmol/L — ABNORMAL HIGH (ref 22–32)
Calcium: 8 mg/dL — ABNORMAL LOW (ref 8.9–10.3)
Chloride: 92 mmol/L — ABNORMAL LOW (ref 98–111)
Creatinine, Ser: 0.44 mg/dL (ref 0.44–1.00)
GFR calc Af Amer: >60 mL/min (ref >60)
GFR calc non Af Amer: >60 mL/min (ref >60)
Glucose, Bld: 84 mg/dL (ref 70–99)
Potassium: 4.5 mmol/L (ref 3.5–5.1)
Sodium: 137 mmol/L (ref 135–145)
Total Bilirubin: 3 mg/dL — ABNORMAL HIGH (ref 0.3–1.2)
Total Protein: 5.6 g/dL — ABNORMAL LOW (ref 6.5–8.1)

## 2018-09-11 LAB — VITAMIN B12: Vitamin B-12: 917 pg/mL — ABNORMAL HIGH (ref 180–914)

## 2018-09-11 IMAGING — US US PARACENTESIS
1 series · 13 of 13 positions shown · non-contrast
Comparison: none

INDICATION: Alcoholic cirrhosis. Recurrent ascites. Request diagnostic and
therapeutic paracentesis.

[Series 1: us paracentesis · 0.26mm/px · 13 of 13 slices shown]
[im 1/13]
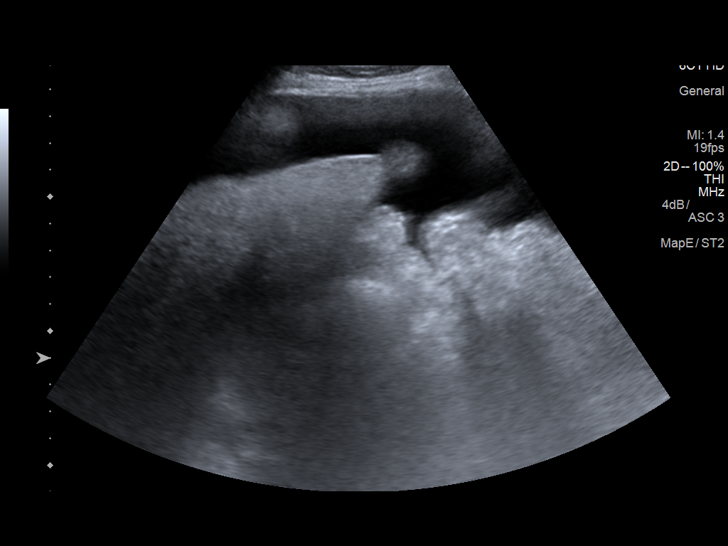
[im 2/13]
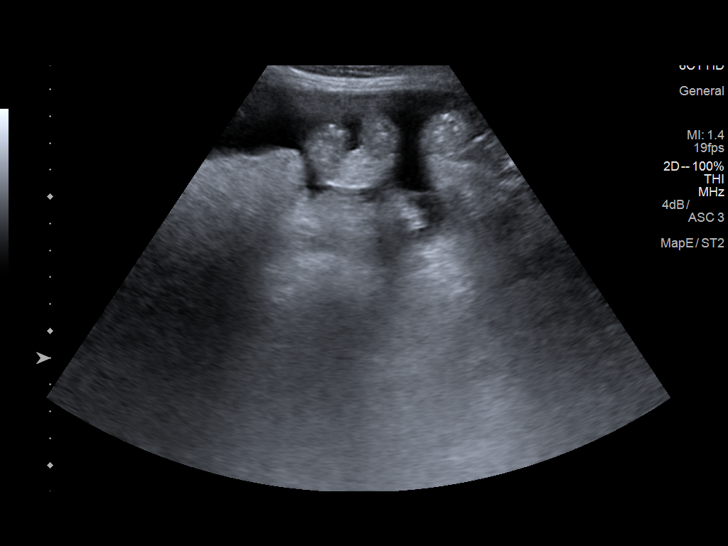
[im 3/13]
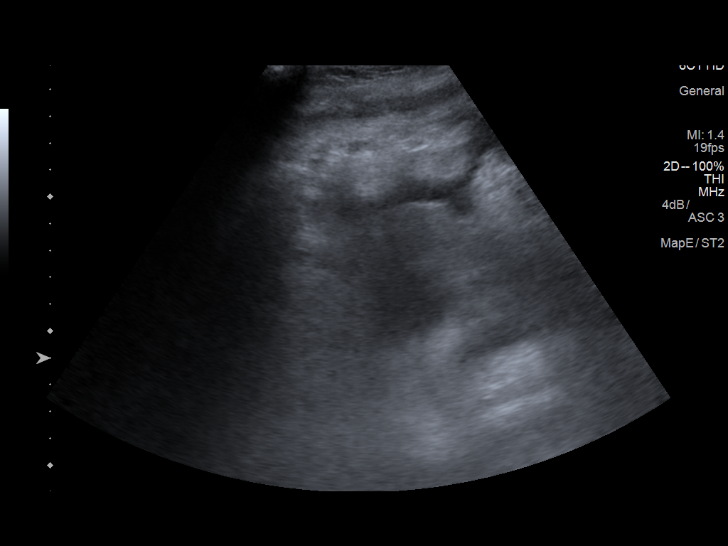
[im 4/13]
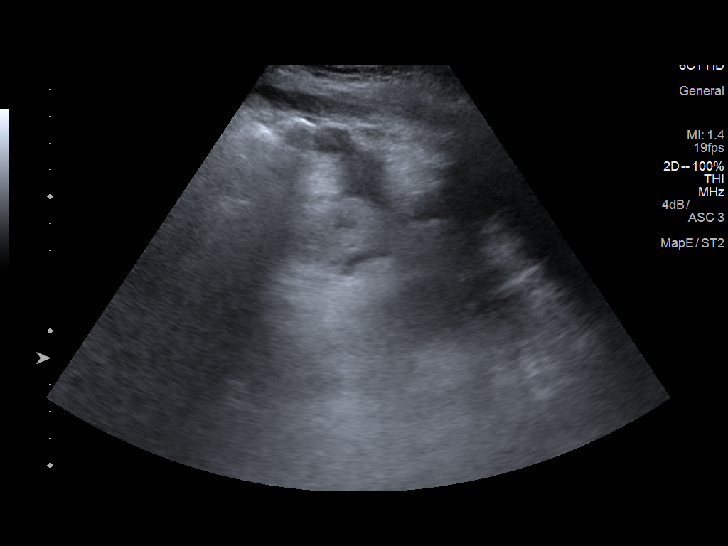
[im 5/13]
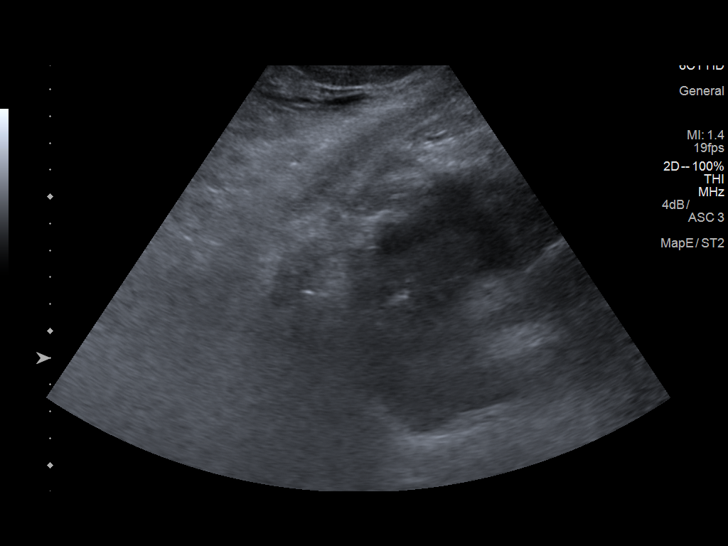
[im 6/13]
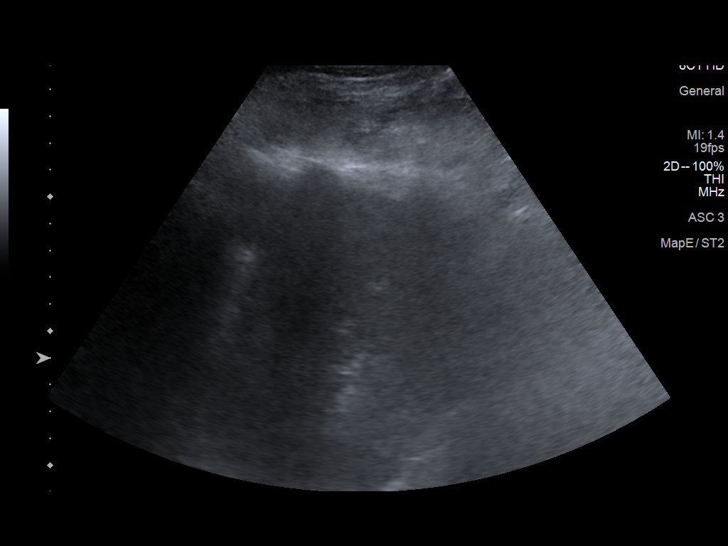
[im 7/13]
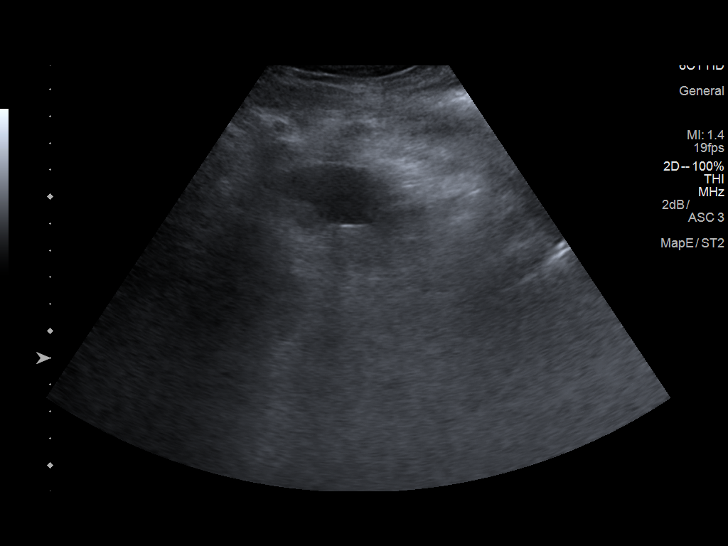
[im 8/13]
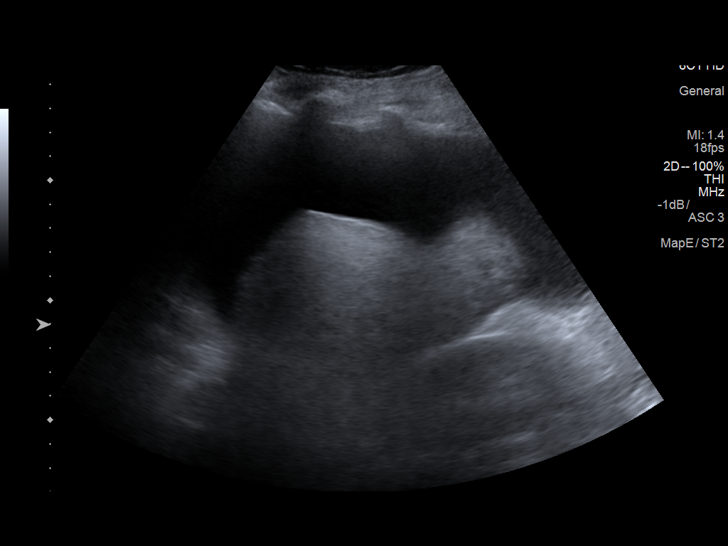
[im 9/13]
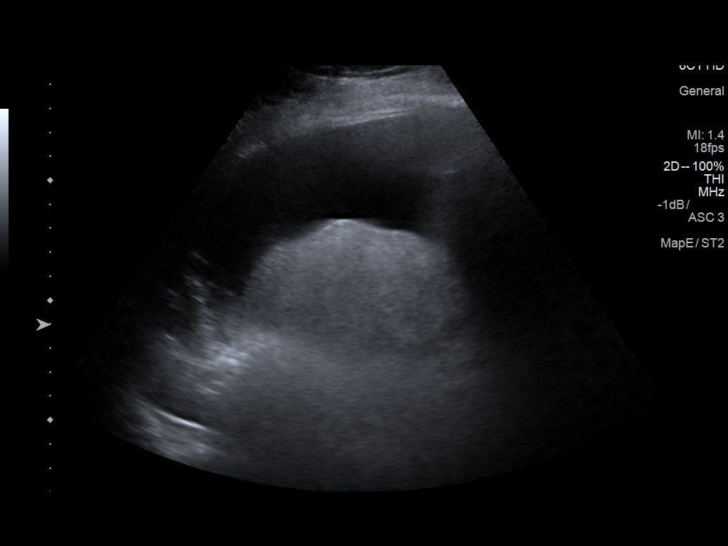
[im 10/13]
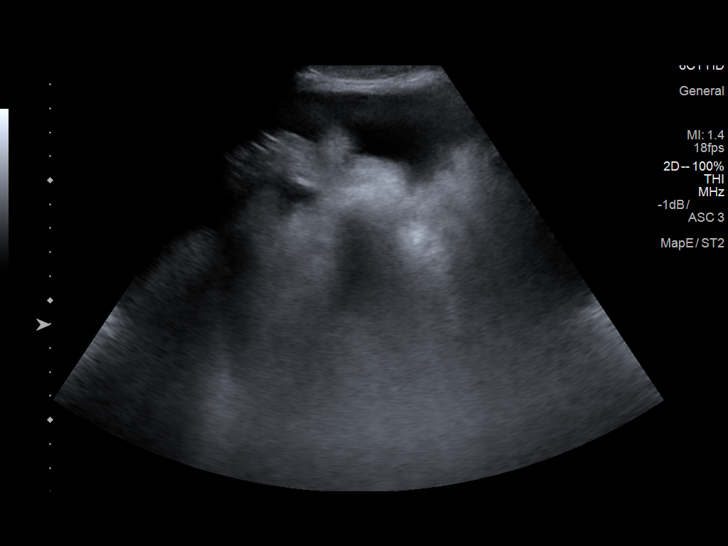
[im 11/13]
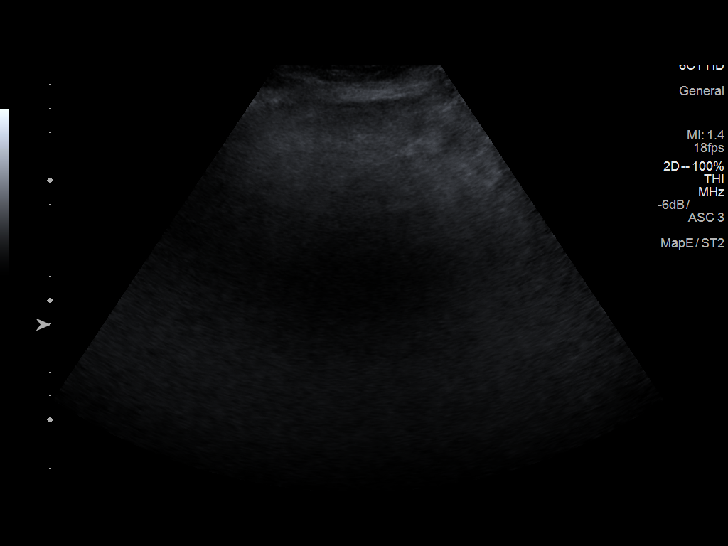
[im 12/13]
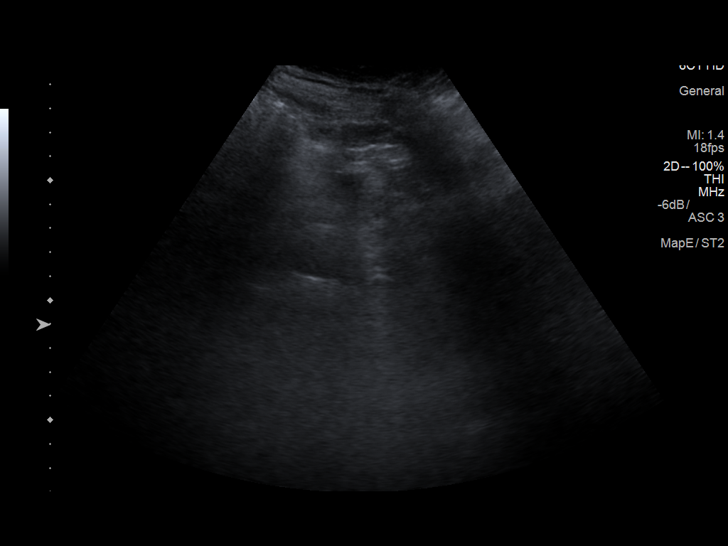
[im 13/13]
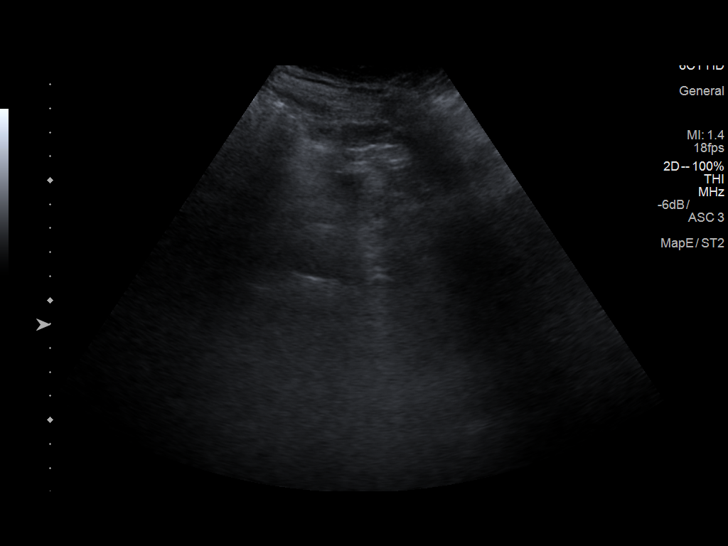

[13 of 13 positions shown; findings below may reference images not displayed]

EXAM:
ULTRASOUND GUIDED RIGHT LOWER QUADRANT PARACENTESIS

MEDICATIONS:
None.

COMPLICATIONS:
None immediate.

PROCEDURE:
Informed written consent was obtained from the patient after a
discussion of the risks, benefits and alternatives to treatment. A
timeout was performed prior to the initiation of the procedure.

Initial ultrasound scanning demonstrates a large amount of ascites
within the right lower abdominal quadrant. The right lower abdomen
was prepped and draped in the usual sterile fashion. 1% lidocaine
with epinephrine was used for local anesthesia.

Following this, a Safe-T-Centesis catheter was introduced. An
ultrasound image was saved for documentation purposes. The
paracentesis was performed. The catheter was removed and a dressing
was applied. The patient tolerated the procedure well without
immediate post procedural complication.
FINDINGS: A total of approximately 2.7 L of clear yellow fluid was removed.
Samples were sent to the laboratory as requested by the clinical
team.
IMPRESSION: Successful ultrasound-guided paracentesis yielding 2.7 liters of
peritoneal fluid.

## 2018-09-11 MED ORDER — SPIRONOLACTONE 12.5 MG HALF TABLET
12.5000 mg | ORAL_TABLET | Freq: Every day | ORAL | Status: DC
  Administered 2018-09-11 – 2018-09-13 (×3): 12.5 mg via ORAL
  Filled 2018-09-11 (×3): qty 1

## 2018-09-11 MED ORDER — FUROSEMIDE 40 MG PO TABS
40.0000 mg | ORAL_TABLET | Freq: Every day | ORAL | Status: DC
  Administered 2018-09-12 – 2018-09-13 (×2): 40 mg via ORAL
  Filled 2018-09-11 (×2): qty 1

## 2018-09-11 NOTE — Progress Notes (Signed)
PROGRESS NOTE    Anne Black  OJJ:[id number] DOB: 16-Sep-1960 DOA: 09/08/2018 PCP: Patient, No Pcp Per  Brief Narrative:  Anne Black  is a 58 y.o. female,  Bipolar, ADD, Multiple Sclerosis, PAfib, Etoh dep, Alcholic cirrhosis w esophageal varices, and ascites, w h/o paracentesis, Lung nodule 62mm (inferior lingular), , Gerd, presents with c/o abdominal pain which is mostly epigastric in nature for the past 2 days.  Patient states that she has some nausea but no vomiting. Her alcohol level was elevated. She was admitted for alcohol intoxication.     Assessment & Plan:   Principal Problem:   Abdominal pain Active Problems:   Depression   GERD (gastroesophageal reflux disease)   Tobacco abuse   Alcohol use disorder, moderate, dependence (HCC)   Anxiety   Protein-calorie malnutrition, severe (HCC)   Nausea, vomiting possibly from alcohol intoxication.  On CIWA at this time. No vomiting, some nausea, .  Improving.   Abdominal discomfort:  Possibly from ascites. US paracentesis ordered, about 6.6 liters of fluid taken out.  She reports abd discomfort has improved.   Hypokalemia replaced.    Acute hepatic encephalopathy:  Continue with lactulose , ammonia levels remain high.    Liver cirrhosis leading to ascites:  Resume lasix and spironolactone.  US paracentesis ordered.  Hold lasix and spironolactone for SBP less than 100.   GERD Resume PPI.   Mild pancreatitis from alcohol intake Resolved.   Anemia of chronic disease:  Hemoglobin stable around 8.    Severe protein calorie malnutrition:  Resume prostat.   Hyperammonemia:  Continue with lactulose.    DVT prophylaxis: scd's Code Status: full code.  Family Communication: discussed with husband over the phone.  Disposition Plan: pending clinical improvement. Discharges to SNF when bed available.    Consultants:   None.    Procedures: US paracentesis.    Antimicrobials: Rocephin.    Subjective:  Alert and comfortable.   Objective: Vitals:   09/11/18 0002 09/11/18 0549 09/11/18 0551 09/11/18 1130  BP: (!) 95/53 (!) 96/59  102/64  Pulse: 80 88  92  Resp: 14 16  18   Temp:  98.4 F (36.9 C)  98.5 F (36.9 C)  TempSrc: Oral Oral  Oral  SpO2: 100% 92%  94%  Weight:   74.4 kg   Height:        Intake/Output Summary (Last 24 hours) at 09/11/2018 1504 Last data filed at 09/11/2018 1300 Gross per 24 hour  Intake 240 ml  Output 900 ml  Net -660 ml   Filed Weights   09/09/18 0428 09/10/18 0611 09/11/18 0551  Weight: 82.6 kg 75.9 kg 74.4 kg    Examination:  General exam: disheveled appearance. Not in distress.  Respiratory system: Clear to auscultation. Respiratory effort normal. No wheezing or rhonchi.  Cardiovascular system: S1 & S2 heard, RRR.  Gastrointestinal system: Abdomen is soft NT ND BS+ Central nervous system: Alert , able to answer simple questions.  Skin: No rashes, lesions or ulcers Psychiatry:  Mood & affect appropriate.     Data Reviewed: I have personally reviewed following labs and imaging studies  CBC: Recent Labs  Lab 09/07/18 0738 09/08/18 1642 09/09/18 0619  WBC 2.9* 3.0* 3.4*  NEUTROABS  --  1.7  --   HGB 9.1* 8.3* 8.8*  HCT 30.4* 27.3* 29.7*  MCV 89.4 90.4 92.8  PLT 99* 83* 71*   Basic Metabolic Panel: Recent Labs  Lab 09/07/18 0738 09/08/18 1642 09/09/18 0619 09/11/18 0504  NA  136 134* 137 137  K 2.6* 2.7* 4.0 4.5  CL 88* 89* 93* 92*  CO2 38* 37* 35* 36*  GLUCOSE 93 97 83 84  BUN <5* <5* <5* 7  CREATININE 0.48 0.37* 0.38* 0.44  CALCIUM 7.6* 7.5* 7.8* 8.0*  MG 1.9  --   --   --    GFR: Estimated Creatinine Clearance: 72.4 mL/min (by C-G formula based on SCr of 0.44 mg/dL). Liver Function Tests: Recent Labs  Lab 09/07/18 0738 09/08/18 1642 09/09/18 0619 09/11/18 0504  AST 41 37 56* 38  ALT 14 15 14 12   ALKPHOS 130* 124 134* 128*  BILITOT 3.5* 3.1* 3.3* 3.0*  PROT 6.2* 6.0* 5.9* 5.6*  ALBUMIN 2.7* 2.6* 2.5* 2.2*    Recent Labs  Lab 09/08/18 1642 09/09/18 0619  LIPASE 55* 42   Recent Labs  Lab 09/08/18 1642 09/09/18 1022  AMMONIA 61* 56*   Coagulation Profile: No results for input(s): INR, PROTIME in the last 168 hours. Cardiac Enzymes: No results for input(s): CKTOTAL, CKMB, CKMBINDEX, TROPONINI in the last 168 hours. BNP (last 3 results) No results for input(s): PROBNP in the last 8760 hours. HbA1C: No results for input(s): HGBA1C in the last 72 hours. CBG: No results for input(s): GLUCAP in the last 168 hours. Lipid Profile: No results for input(s): CHOL, HDL, LDLCALC, TRIG, CHOLHDL, LDLDIRECT in the last 72 hours. Thyroid Function Tests: No results for input(s): TSH, T4TOTAL, FREET4, T3FREE, THYROIDAB in the last 72 hours. Anemia Panel: Recent Labs    09/11/18 0504  VITAMINB12 917*   Sepsis Labs: No results for input(s): PROCALCITON, LATICACIDVEN in the last 168 hours.  Recent Results (from the past 240 hour(s))  SARS Coronavirus 2 (CEPHEID - Performed in [facility] hospital lab), Hosp Order     Status: None   Collection Time: 09/07/18  9:26 AM   Specimen: Nasopharyngeal Swab  Result Value Ref Range Status   SARS Coronavirus 2 NEGATIVE NEGATIVE Final    Comment: (NOTE) If result is NEGATIVE SARS-CoV-2 target nucleic acids are NOT DETECTED. The SARS-CoV-2 RNA is generally detectable in upper and lower  respiratory specimens during the acute phase of infection. The lowest  concentration of SARS-CoV-2 viral copies this assay can detect is 250  copies / mL. A negative result does not preclude SARS-CoV-2 infection  and should not be used as the sole basis for treatment or other  patient management decisions.  A negative result may occur with  improper specimen collection / handling, submission of specimen other  than nasopharyngeal swab, presence of viral mutation(s) within the  areas targeted by this assay, and inadequate number of viral copies  (<250 copies / mL). A  negative result must be combined with clinical  observations, patient history, and epidemiological information. If result is POSITIVE SARS-CoV-2 target nucleic acids are DETECTED. The SARS-CoV-2 RNA is generally detectable in upper and lower  respiratory specimens dur ing the acute phase of infection.  Positive  results are indicative of active infection with SARS-CoV-2.  Clinical  correlation with patient history and other diagnostic information is  necessary to determine patient infection status.  Positive results do  not rule out bacterial infection or co-infection with other viruses. If result is PRESUMPTIVE POSTIVE SARS-CoV-2 nucleic acids MAY BE PRESENT.   A presumptive positive result was obtained on the submitted specimen  and confirmed on repeat testing.  While 2019 novel coronavirus  (SARS-CoV-2) nucleic acids may be present in the submitted sample  additional confirmatory testing  may be necessary for epidemiological  and / or clinical management purposes  to differentiate between  SARS-CoV-2 and other Sarbecovirus currently known to infect humans.  If clinically indicated additional testing with an alternate test  methodology [phone]([id number]) is advised. The SARS-CoV-2 RNA is generally  detectable in upper and lower respiratory sp ecimens during the acute  phase of infection. The expected result is Negative. Fact Sheet for Patients:  BoilerBrush.com.cyhttps://www.fda.gov/media/136312/download Fact Sheet for Healthcare Providers: https://pope.com/https://www.fda.gov/media/136313/download This test is not yet approved or cleared by the [place] FDA and has been authorized for detection and/or diagnosis of SARS-CoV-2 by FDA under an Emergency Use Authorization (EUA).  This EUA will remain in effect (meaning this test can be used) for the duration of the COVID-19 declaration under Section 564(b)(1) of the Act, 21 U.S.C. section 360bbb-3(b)(1), unless the authorization is terminated or revoked sooner. Performed  at [facility], [address] [place], [place] [address]   SARS Coronavirus 2 [facility](Hospital order, Performed in [facility] hospital lab)     Status: None   Collection Time: 09/08/18  7:45 PM  Result Value Ref Range Status   SARS Coronavirus 2 NEGATIVE NEGATIVE Final    Comment: (NOTE) If result is NEGATIVE SARS-CoV-2 target nucleic acids are NOT DETECTED. The SARS-CoV-2 RNA is generally detectable in upper and lower  respiratory specimens during the acute phase of infection. The lowest  concentration of SARS-CoV-2 viral copies this assay can detect is 250  copies / mL. A negative result does not preclude SARS-CoV-2 infection  and should not be used as the sole basis for treatment or other  patient management decisions.  A negative result may occur with  improper specimen collection / handling, submission of specimen other  than nasopharyngeal swab, presence of viral mutation(s) within the  areas targeted by this assay, and inadequate number of viral copies  (<250 copies / mL). A negative result must be combined with clinical  observations, patient history, and epidemiological information. If result is POSITIVE SARS-CoV-2 target nucleic acids are DETECTED. The SARS-CoV-2 RNA is generally detectable in upper and lower  respiratory specimens dur ing the acute phase of infection.  Positive  results are indicative of active infection with SARS-CoV-2.  Clinical  correlation with patient history and other diagnostic information is  necessary to determine patient infection status.  Positive results do  not rule out bacterial infection or co-infection with other viruses. If result is PRESUMPTIVE POSTIVE SARS-CoV-2 nucleic acids MAY BE PRESENT.   A presumptive positive result was obtained on the submitted specimen  and confirmed on repeat testing.  While 2019 novel coronavirus  (SARS-CoV-2) nucleic acids may be present in the submitted sample  additional confirmatory  testing may be necessary for epidemiological  and / or clinical management purposes  to differentiate between  SARS-CoV-2 and other Sarbecovirus currently known to infect humans.  If clinically indicated additional testing with an alternate test  methodology [phone]([id number]) is advised. The SARS-CoV-2 RNA is generally  detectable in upper and lower respiratory sp ecimens during the acute  phase of infection. The expected result is Negative. Fact Sheet for Patients:  BoilerBrush.com.cyhttps://www.fda.gov/media/136312/download Fact Sheet for Healthcare Providers: https://pope.com/https://www.fda.gov/media/136313/download This test is not yet approved or cleared by the [place] FDA and has been authorized for detection and/or diagnosis of SARS-CoV-2 by FDA under an Emergency Use Authorization (EUA).  This EUA will remain in effect (meaning this test can be used) for the duration of the COVID-19 declaration under Section 564(b)(1) of  the Act, 21 U.S.C. section 360bbb-3(b)(1), unless the authorization is terminated or revoked sooner. Performed at [facility], [place] [address] [place], [place] [address]          Radiology Studies: No results found.      Scheduled Meds: . feeding supplement (PRO-STAT SUGAR FREE 64)  30 mL Oral BID  . folic acid  1 mg Oral Daily  . gabapentin  200 mg Oral TID  . lactulose  20 g Oral BID  . LORazepam  0-4 mg Intravenous Q12H  . multivitamin with minerals  1 tablet Oral Daily  . pantoprazole  40 mg Oral Daily  . potassium chloride SA  40 mEq Oral BID  . sodium chloride flush  3 mL Intravenous Q12H  . thiamine  100 mg Oral Daily   Or  . thiamine  100 mg Intravenous Daily   Continuous Infusions: . sodium chloride       LOS: 2 days    Time spent: 32 minutes.     Hosie Poisson, MD [facility] Pager [phone]  If 7PM-7AM, please contact night-coverage [email or web address] Password [email or web address] 09/11/2018, 3:04 PM

## 2018-09-11 NOTE — TOC Progression Note (Signed)
Transition of Care [facility]) - Progression Note    Patient Details  Name: Anne Black MRN: [id number] Date of Birth: 1960-11-10  [facility]) CM/SW [place], LCSW Phone Number: 09/11/2018, 4:35 PM  Clinical Narrative:  CSW spoke to patients husband Anne Black). Anne Black has guardianship over patient and paperwork has been scanned to patients chart. Anne Black stated that he is agreeable with physical therapy's recommendation and would like patient to dc to a SNF. CSW to follow up with patient once bed offers are available            Expected Discharge Plan and Services                                                 Social Determinants of Health (SDOH) Interventions    Readmission Risk Interventions No flowsheet data found.

## 2018-09-11 NOTE — NC FL2 (Signed)
MEDICAID FL2 LEVEL OF CARE SCREENING TOOL     IDENTIFICATION  Patient Name: Anne Black Birthdate: 03-14-1960 Sex: female Admission Date (Current Location): 09/08/2018  [facility] [place]   Facility and Address:  [facility],  [address], [place] [address]      Provider Number: [id number]  Attending Physician Name and Address:  Kathlen ModyAkula, Vijaya, MD  Relative Name and Phone Number:  Azucena KubaReid [place],[phone]    Current Level of Care: SNF Recommended Level of Care:   Prior Approval Number:    Date Approved/Denied:   PASRR Number: [id number]  Discharge Plan: SNF    Current Diagnoses: Patient Active Problem List   Diagnosis Date Noted  . ADHD (attention deficit hyperactivity disorder)   . Anemia 08/16/2018  . Palliative care by specialist   . Cellulitis of lower extremity   . Peripheral edema   . Atrial fibrillation with RVR (HCC) 06/13/2018  . Alcohol dependence (HCC) 05/06/2018  . Electrolyte disturbance 05/05/2018  . Alcohol withdrawal delirium (HCC) 04/28/2018  . Weakness generalized 04/27/2018  . Gait abnormality 04/24/2018  . Chronic hypoxemic respiratory failure (HCC) 04/11/2018  . Atrial fibrillation with rapid ventricular response (HCC) 03/23/2018  . Head injury   . Fall 03/19/2018  . Syncope 03/19/2018  . Orbital fracture 03/19/2018  . Scalp laceration 03/19/2018  . Acute respiratory failure with hypoxia and hypercarbia (HCC) 03/19/2018  . Abdominal tenderness 03/19/2018  . Acute on chronic respiratory failure with hypoxia (HCC) 03/17/2018  . Alcohol abuse with intoxication (HCC) 03/17/2018  . ETOH abuse   . Normocytic anemia 03/16/2018  . Acute metabolic encephalopathy 03/16/2018  . Lower extremity edema 03/16/2018  . Migraine 03/16/2018  . A-fib (HCC) 02/18/2018  . Community acquired pneumonia of left lower lobe of lung (HCC)   . Weakness 08/21/2017  . COPD exacerbation (HCC) 06/12/2017  . Chest  pain with moderate risk for cardiac etiology 05/30/2017  . Abnormal LFTs 05/30/2017  . Tachycardia 05/30/2017  . COPD (chronic obstructive pulmonary disease) (HCC) 04/19/2017  . Right rib fracture 04/15/2017  . Abdominal wall cellulitis 12/30/2016  . S/P exploratory laparotomy 12/30/2016  . Distal radius fracture, left 09/09/2016  . Thrombocytopenia (HCC) 09/09/2016  . Chest x-ray abnormality   . Umbilical hernia without obstruction and without gangrene 05/12/2016  . Itching 02/24/2016  . Ventral hernia without obstruction or gangrene 02/24/2016  . Goals of care, counseling/discussion   . Ascites   . Tachypnea   . Atrial flutter with rapid ventricular response (HCC)   . Hypomagnesemia   . Hypoxia   . Dehydration 01/22/2016  . Low back ache 12/30/2015  . Non-intractable vomiting with nausea   . Alcohol abuse with alcohol-induced mood disorder (HCC) 12/15/2015  . Acute alcoholism (HCC)   . Distended abdomen 11/04/2015  . Alcohol abuse   . Dyspnea   . Acute respiratory failure (HCC) 10/15/2015  . Ascites due to alcoholic cirrhosis (HCC) 10/15/2015  . Osteoporosis   . Multiple sclerosis (HCC)   . Alcoholic cirrhosis of liver with ascites (HCC) 08/22/2015  . Bipolar disorder, current episode mixed, moderate (HCC) 08/01/2015  . Alcohol use disorder, severe, dependence (HCC) 07/31/2015  . Macrocytic anemia- due to alcohol abuse with normal B12 & folate levels 05/31/2015  . Protein-calorie malnutrition, severe (HCC) 05/31/2015  . Hypokalemia 05/26/2015  . Alcohol withdrawal (HCC) 05/18/2015  . Abdominal pain 05/18/2015  . Anxiety 05/18/2015  . Stimulant abuse (HCC) 12/27/2014  . Nicotine abuse 12/27/2014  . Hyperprolactinemia (  [place]) 11/15/2014  . Dyslipidemia   . Alcohol use disorder, moderate, dependence ([place]) 10/30/2014  . Tobacco abuse 01/23/2014  . Bipolar 1 disorder ([place]) 04/07/2013  . Noncompliance with therapeutic plan 04/04/2013  . Hereditary and idiopathic peripheral  neuropathy 05/01/2012  . Depression 05/01/2012  . GERD (gastroesophageal reflux disease) 05/01/2012  . Osteoarthrosis, unspecified whether generalized or localized, involving lower leg 05/01/2012  . Hyponatremia 07/01/2011    Orientation RESPIRATION BLADDER Height & Weight     Self, Time, Situation, Place  O2(4L) Incontinent, External catheter Weight: 164 lb 0.4 oz (74.4 kg) Height:  5\' 2"  (157.5 cm)  BEHAVIORAL SYMPTOMS/MOOD NEUROLOGICAL BOWEL NUTRITION STATUS      Incontinent Diet(soft)  AMBULATORY STATUS COMMUNICATION OF NEEDS Skin   Extensive Assist Verbally                         Personal Care Assistance Level of Assistance  Bathing, Feeding, Dressing Bathing Assistance: Limited assistance Feeding assistance: Limited assistance Dressing Assistance: Limited assistance Total Care Assistance: Maximum assistance   Functional Limitations Info  Hearing, Sight, Speech Sight Info: Adequate Hearing Info: Adequate Speech Info: Adequate    SPECIAL CARE FACTORS FREQUENCY  PT (By licensed PT), OT (By licensed OT)     PT Frequency: 5x wk OT Frequency: 5x wk            Contractures Contractures Info: Not present    Additional Factors Info  Code Status, Allergies Code Status Info: full code Allergies Info: No known allergies           Current Medications (09/11/2018):  This is the current hospital active medication list Current Facility-Administered Medications  Medication Dose Route Frequency Provider Last Rate Last Dose  . 0.9 %  sodium chloride infusion  250 mL Intravenous PRN Jani Gravel, MD      . feeding supplement (PRO-STAT SUGAR FREE 64) liquid 30 mL  30 mL Oral BID Jani Gravel, MD   30 mL at 09/11/18 0939  . folic acid (FOLVITE) tablet 1 mg  1 mg Oral Daily Jani Gravel, MD   1 mg at 09/11/18 1191  . [START ON 09/12/2018] furosemide (LASIX) tablet 40 mg  40 mg Oral Daily Hosie Poisson, MD      . gabapentin (NEURONTIN) capsule 200 mg  200 mg Oral TID Jani Gravel,  MD   200 mg at 09/11/18 4782  . hydrOXYzine (ATARAX/VISTARIL) tablet 25 mg  25 mg Oral Q6H PRN Hosie Poisson, MD   25 mg at 09/11/18 1009  . lactulose (CHRONULAC) 10 GM/15ML solution 20 g  20 g Oral BID Jani Gravel, MD   20 g at 09/11/18 9562  . LORazepam (ATIVAN) injection 0-4 mg  0-4 mg Intravenous Q12H Jani Gravel, MD      . LORazepam (ATIVAN) tablet 1 mg  1 mg Oral Q4H PRN Hosie Poisson, MD   1 mg at 09/09/18 1050   Or  . LORazepam (ATIVAN) injection 1 mg  1 mg Intravenous Q6H PRN Hosie Poisson, MD      . multivitamin with minerals tablet 1 tablet  1 tablet Oral Daily Jani Gravel, MD   1 tablet at 09/11/18 [phone]  . ondansetron (ZOFRAN-ODT) disintegrating tablet 8 mg  8 mg Oral Q8H PRN Jani Gravel, MD   8 mg at 09/10/18 0111  . pantoprazole (PROTONIX) EC tablet 40 mg  40 mg Oral Daily Jani Gravel, MD   40 mg at 09/11/18 6578  . potassium chloride SA (  K-DUR) CR tablet 40 mEq  40 mEq Oral BID Pearson GrippeKim, James, MD   40 mEq at 09/11/18 [id number]  . sodium chloride flush (NS) 0.9 % injection 3 mL  3 mL Intravenous Q12H Pearson GrippeKim, James, MD   3 mL at 09/11/18 0941  . sodium chloride flush (NS) 0.9 % injection 3 mL  3 mL Intravenous PRN Pearson GrippeKim, James, MD      . spironolactone (ALDACTONE) tablet 12.5 mg  12.5 mg Oral Daily Kathlen ModyAkula, Vijaya, MD      . thiamine (VITAMIN B-1) tablet 100 mg  100 mg Oral Daily Pearson GrippeKim, James, MD   100 mg at 09/11/18 [id number]   Or  . thiamine (B-1) injection 100 mg  100 mg Intravenous Daily Pearson GrippeKim, James, MD      . traMADol Janean Sark(ULTRAM) tablet 50 mg  50 mg Oral Q6H PRN Kathlen ModyAkula, Vijaya, MD   50 mg at 09/11/18 1009     Discharge Medications: Please see discharge summary for a list of discharge medications.  Relevant Imaging Results:  Relevant Lab Results:   Additional Information SSN: [id number]  Althea CharonAshley C Javonn Gauger, LCSW

## 2018-09-12 LAB — SARS CORONAVIRUS 2 (TAT 6-24 HRS): SARS Coronavirus 2: NEGATIVE

## 2018-09-12 MED ORDER — PRO-STAT SUGAR FREE PO LIQD: 30.0000 mL | Freq: Two times a day (BID) | ORAL | 0 refills | Status: AC

## 2018-09-12 MED ORDER — FOLIC ACID 1 MG PO TABS: 1.0000 mg | ORAL_TABLET | Freq: Every day | ORAL | Status: AC

## 2018-09-12 NOTE — Progress Notes (Signed)
TOC CM contacted [facility] to cancel new PCP appt. Plan is for pt to dc to SNF. Jonnie Finner RN CCM Case Mgmt phone [phone]

## 2018-09-12 NOTE — Discharge Summary (Addendum)
Physician Discharge Summary  Anne Black GNF:[id number] DOB: [birth year] DOA: 09/08/2018  PCP: Patient, No Pcp Per  Admit date: 09/08/2018 Discharge date: 09/13/2018  Admitted From: Home.  Disposition:  SNF  Recommendations for Outpatient Follow-up:  1. Follow up with PCP in 1-2 weeks 2. Please obtain BMP/CBC in one week Please follow up with gastroenterology in one week.    Discharge Condition:stable.  CODE STATUS: full code.  Diet recommendation: Heart Healthy  Brief/Interim Summary: CarlaHarveyis a57 y.o.female,Bipolar, ADD, Multiple Sclerosis, PAfib, Etoh dep, Alcholic cirrhosis w esophageal varices, and ascites, w h/o paracentesis, Lung nodule 5mm (inferior lingular), , Gerd, presents with c/o abdominal pain which is mostly epigastric in nature for the past 2 days. Patient states that she has some nausea but no vomiting. Her alcohol level was elevated. She was admitted for alcohol intoxication.   Discharge Diagnoses:  Principal Problem:   Abdominal pain Active Problems:   Depression   GERD (gastroesophageal reflux disease)   Tobacco abuse   Alcohol use disorder, moderate, dependence (HCC)   Anxiety   Protein-calorie malnutrition, severe (HCC)  Nausea, vomiting possibly from alcohol intoxication.  Nausea and vomiting has improved.    Abdominal discomfort:  Possibly from ascites. US paracentesis ordered, about 6.6 liters of fluid taken out.  She reports abd discomfort has improved.   Hypokalemia replaced.    Acute hepatic encephalopathy:  Improved. Continue with lactulose ,    Liver cirrhosis leading to ascites:  Resume lasix and spironolactone.  US paracentesis ordered and >6 lit of fluid removed.     GERD Resume PPI.   Mild pancreatitis from alcohol intake Resolved.   Anemia of chronic disease:  Hemoglobin stable around 8.    Severe protein calorie malnutrition:  Resume prostat.   Hyperammonemia:  Continue with lactulose.    Discharge Instructions  Discharge Instructions    Diet - low sodium heart healthy   Complete by: As directed    Discharge instructions   Complete by: As directed    Please follow up with gastroenterology in 1 to 2 weeks.   Increase activity slowly   Complete by: As directed      Allergies as of 09/12/2018   No Known Allergies     Medication List    STOP taking these medications   ondansetron 4 MG tablet Commonly known as: ZOFRAN     TAKE these medications   feeding supplement (PRO-STAT SUGAR FREE 64) Liqd Take 30 mLs by mouth 2 (two) times daily.   folic acid 1 MG tablet Commonly known as: FOLVITE Take 1 tablet (1 mg total) by mouth daily.   furosemide 40 MG tablet Commonly known as: LASIX Take 1 tablet (40 mg total) by mouth daily.   gabapentin 100 MG capsule Commonly known as: NEURONTIN Take 2 capsules (200 mg total) by mouth 3 (three) times daily for 30 days.   hydrOXYzine 25 MG tablet Commonly known as: ATARAX/VISTARIL Take 1 tablet (25 mg total) by mouth daily as needed for up to 30 doses (anxiety).   lactulose 10 GM/15ML solution Commonly known as: CHRONULAC Take 30 mLs (20 g total) by mouth 2 (two) times daily.   LORazepam 0.5 MG tablet Commonly known as: ATIVAN Take 1 tablet (0.5 mg total) by mouth every 12 (twelve) hours as needed for anxiety.   multivitamin with minerals Tabs tablet Take 1 tablet by mouth daily.   ondansetron 8 MG disintegrating tablet Commonly known as: Zofran ODT Take 1 tablet (8 mg total) by mouth every  8 (eight) hours as needed for nausea or vomiting.   pantoprazole 20 MG tablet Commonly known as: PROTONIX Take 2 tablets (40 mg total) by mouth daily.   potassium chloride SA 20 MEQ tablet Commonly known as: K-DUR Take 2 tablets (40 mEq total) by mouth 2 (two) times daily.   spironolactone 25 MG tablet Commonly known as: ALDACTONE Take 1 tablet (25 mg total) by mouth daily for 30 days.   thiamine 100 MG tablet Take  1 tablet (100 mg total) by mouth daily.       No Known Allergies  Consultations:  None.    Procedures/Studies: US Paracentesis  Result Date: 09/09/2018 INDICATION: Cirrhosis, abdominal distension, recurrent ascites EXAM: ULTRASOUND GUIDED THERAPEUTIC PARACENTESIS MEDICATIONS: 1% lidocaine local COMPLICATIONS: None immediate. PROCEDURE: Informed written consent was obtained from the patient after a discussion of the risks, benefits and alternatives to treatment. A timeout was performed prior to the initiation of the procedure. Initial ultrasound scanning demonstrates a large amount of ascites within the right lower abdominal quadrant. The right lower abdomen was prepped and draped in the usual sterile fashion. 1% lidocaine with epinephrine was used for local anesthesia. Following this, a 6 Fr Safe-T-Centesis catheter was introduced. An ultrasound image was saved for documentation purposes. The paracentesis was performed. The catheter was removed and a dressing was applied. The patient tolerated the procedure well without immediate post procedural complication. FINDINGS: A total of approximately 6.6 L of clear peritoneal fluid was removed. Sample was not sent for laboratory analysis IMPRESSION: Successful ultrasound-guided paracentesis yielding 6.6 liters of peritoneal fluid. Electronically Signed   By: Jerilynn Mages.  Shick M.D.   On: 09/09/2018 13:32   Dg Chest Port 1 View  Result Date: 08/19/2018 CLINICAL DATA:  Shortness of breath. EXAM: PORTABLE CHEST 1 VIEW COMPARISON:  August 10, 2018 FINDINGS: Cardiomediastinal silhouette is normal. Mediastinal contours appear intact. There is no evidence of focal airspace consolidation, pleural effusion or pneumothorax. Osseous structures are without acute abnormality. Soft tissues are grossly normal. IMPRESSION: No active disease. Electronically Signed   By: Fidela Salisbury M.D.   On: 08/19/2018 15:30   Ir Paracentesis  Result Date: 08/18/2018 INDICATION: Patient  with history of abdominal distension, recurrent ascites. Request made for paracentesis. EXAM: ULTRASOUND GUIDED THERAPEUTIC PARACENTESIS MEDICATIONS: 10 mL 1% lidocaine COMPLICATIONS: None immediate. PROCEDURE: Informed written consent was obtained from the patient after a discussion of the risks, benefits and alternatives to treatment. A timeout was performed prior to the initiation of the procedure. Initial ultrasound scanning demonstrates a large amount of ascites within the right lower abdominal quadrant. The right lower abdomen was prepped and draped in the usual sterile fashion. 1% lidocaine was used for local anesthesia. Following this, a 19 gauge, 7-cm, Yueh catheter was introduced. An ultrasound image was saved for documentation purposes. The paracentesis was performed. The catheter was removed and a dressing was applied. The patient tolerated the procedure well without immediate post procedural complication. FINDINGS: A total of approximately 3.7 liters of yellow fluid was removed. IMPRESSION: Successful ultrasound-guided therapeutic paracentesis yielding 3.7 liters of peritoneal fluid. Read by: Brynda Greathouse PA-C Electronically Signed   By: Corrie Mckusick D.O.   On: 08/18/2018 15:05   Ir Paracentesis  Result Date: 08/14/2018 INDICATION: Patient with history of alcoholic cirrhosis with recurrent ascites. Request is made for therapeutic paracentesis. EXAM: ULTRASOUND GUIDED THERAPEUTIC PARACENTESIS MEDICATIONS: 10 mL 1% lidocaine COMPLICATIONS: None immediate. PROCEDURE: Informed written consent was obtained from the patient after a discussion of the risks, benefits and  alternatives to treatment. A timeout was performed prior to the initiation of the procedure. Initial ultrasound scanning demonstrates a large amount of ascites within the right lower abdominal quadrant. The right lower abdomen was prepped and draped in the usual sterile fashion. 1% lidocaine was used for local anesthesia. Following this,  a 19 gauge, 7-cm, Yueh catheter was introduced. An ultrasound image was saved for documentation purposes. The paracentesis was performed. The catheter was removed and a dressing was applied. The patient tolerated the procedure well without immediate post procedural complication. FINDINGS: A total of approximately 5.7 L of clear gold fluid was removed. IMPRESSION: Successful ultrasound-guided paracentesis yielding 5.7 L of peritoneal fluid. Read by: Elwin Mocha, PA-C Electronically Signed   By: Richarda Overlie M.D.   On: 08/14/2018 12:27    (   Subjective: No chest pain or sob.   Discharge Exam: Vitals:   09/12/18 0100 09/12/18 0615  BP: 104/62 (!) 101/50  Pulse: 82 87  Resp: 14 16  Temp: 98 F (36.7 C) 98.2 F (36.8 C)  SpO2: 100% 100%   Vitals:   09/11/18 1130 09/11/18 1904 09/12/18 0100 09/12/18 0615  BP: 102/64 (!) 98/49 104/62 (!) 101/50  Pulse: 92 84 82 87  Resp: 18 18 14 16   Temp: 98.5 F (36.9 C) 98 F (36.7 C) 98 F (36.7 C) 98.2 F (36.8 C)  TempSrc: Oral Oral Oral Axillary  SpO2: 94% 100% 100% 100%  Weight:    74.5 kg  Height:        General: Pt is alert, awake, not in acute distress Cardiovascular: RRR, S1/S2 +, no rubs, no gallops Respiratory: CTA bilaterally, no wheezing, no rhonchi Abdominal: soft mildly distended. . Extremities: no edema, no cyanosis    The results of significant diagnostics from this hospitalization (including imaging, microbiology, ancillary and laboratory) are listed below for reference.     Microbiology: Recent Results (from the past 240 hour(s))  SARS Coronavirus 2 (CEPHEID - Performed in [facility] hospital lab), Hosp Order     Status: None   Collection Time: 09/07/18  9:26 AM   Specimen: Nasopharyngeal Swab  Result Value Ref Range Status   SARS Coronavirus 2 NEGATIVE NEGATIVE Final    Comment: (NOTE) If result is NEGATIVE SARS-CoV-2 target nucleic acids are NOT DETECTED. The SARS-CoV-2 RNA is generally detectable in upper  and lower  respiratory specimens during the acute phase of infection. The lowest  concentration of SARS-CoV-2 viral copies this assay can detect is 250  copies / mL. A negative result does not preclude SARS-CoV-2 infection  and should not be used as the sole basis for treatment or other  patient management decisions.  A negative result may occur with  improper specimen collection / handling, submission of specimen other  than nasopharyngeal swab, presence of viral mutation(s) within the  areas targeted by this assay, and inadequate number of viral copies  (<250 copies / mL). A negative result must be combined with clinical  observations, patient history, and epidemiological information. If result is POSITIVE SARS-CoV-2 target nucleic acids are DETECTED. The SARS-CoV-2 RNA is generally detectable in upper and lower  respiratory specimens dur ing the acute phase of infection.  Positive  results are indicative of active infection with SARS-CoV-2.  Clinical  correlation with patient history and other diagnostic information is  necessary to determine patient infection status.  Positive results do  not rule out bacterial infection or co-infection with other viruses. If result is PRESUMPTIVE POSTIVE SARS-CoV-2 nucleic acids  MAY BE PRESENT.   A presumptive positive result was obtained on the submitted specimen  and confirmed on repeat testing.  While 2019 novel coronavirus  (SARS-CoV-2) nucleic acids may be present in the submitted sample  additional confirmatory testing may be necessary for epidemiological  and / or clinical management purposes  to differentiate between  SARS-CoV-2 and other Sarbecovirus currently known to infect humans.  If clinically indicated additional testing with an alternate test  methodology [phone]([id number]) is advised. The SARS-CoV-2 RNA is generally  detectable in upper and lower respiratory sp ecimens during the acute  phase of infection. The expected result is  Negative. Fact Sheet for Patients:  BoilerBrush.com.cyhttps://www.fda.gov/media/136312/download Fact Sheet for Healthcare Providers: https://pope.com/https://www.fda.gov/media/136313/download This test is not yet approved or cleared by the [place] FDA and has been authorized for detection and/or diagnosis of SARS-CoV-2 by FDA under an Emergency Use Authorization (EUA).  This EUA will remain in effect (meaning this test can be used) for the duration of the COVID-19 declaration under Section 564(b)(1) of the Act, 21 U.S.C. section 360bbb-3(b)(1), unless the authorization is terminated or revoked sooner. Performed at [facility], [address] [place], [place] [address]   SARS Coronavirus 2 [facility](Hospital order, Performed in [facility] lab)     Status: None   Collection Time: 09/08/18  7:45 PM  Result Value Ref Range Status   SARS Coronavirus 2 NEGATIVE NEGATIVE Final    Comment: (NOTE) If result is NEGATIVE SARS-CoV-2 target nucleic acids are NOT DETECTED. The SARS-CoV-2 RNA is generally detectable in upper and lower  respiratory specimens during the acute phase of infection. The lowest  concentration of SARS-CoV-2 viral copies this assay can detect is 250  copies / mL. A negative result does not preclude SARS-CoV-2 infection  and should not be used as the sole basis for treatment or other  patient management decisions.  A negative result may occur with  improper specimen collection / handling, submission of specimen other  than nasopharyngeal swab, presence of viral mutation(s) within the  areas targeted by this assay, and inadequate number of viral copies  (<250 copies / mL). A negative result must be combined with clinical  observations, patient history, and epidemiological information. If result is POSITIVE SARS-CoV-2 target nucleic acids are DETECTED. The SARS-CoV-2 RNA is generally detectable in upper and lower  respiratory specimens dur ing the acute phase of infection.   Positive  results are indicative of active infection with SARS-CoV-2.  Clinical  correlation with patient history and other diagnostic information is  necessary to determine patient infection status.  Positive results do  not rule out bacterial infection or co-infection with other viruses. If result is PRESUMPTIVE POSTIVE SARS-CoV-2 nucleic acids MAY BE PRESENT.   A presumptive positive result was obtained on the submitted specimen  and confirmed on repeat testing.  While 2019 novel coronavirus  (SARS-CoV-2) nucleic acids may be present in the submitted sample  additional confirmatory testing may be necessary for epidemiological  and / or clinical management purposes  to differentiate between  SARS-CoV-2 and other Sarbecovirus currently known to infect humans.  If clinically indicated additional testing with an alternate test  methodology [phone]([id number]) is advised. The SARS-CoV-2 RNA is generally  detectable in upper and lower respiratory sp ecimens during the acute  phase of infection. The expected result is Negative. Fact Sheet for Patients:  BoilerBrush.com.cyhttps://www.fda.gov/media/136312/download Fact Sheet for Healthcare Providers: https://pope.com/https://www.fda.gov/media/136313/download This test is not yet approved or cleared by the [place] FDA and  has been authorized for detection and/or diagnosis of SARS-CoV-2 by FDA under an Emergency Use Authorization (EUA).  This EUA will remain in effect (meaning this test can be used) for the duration of the COVID-19 declaration under Section 564(b)(1) of the Act, 21 U.S.C. section 360bbb-3(b)(1), unless the authorization is terminated or revoked sooner. Performed at [facility], [address] [place], [place] [address]      Labs: BNP (last 3 results) Recent Labs    04/24/18 0439 05/30/18 0903 06/30/18 1729  BNP 461.5* 60.2 81.9   Basic Metabolic Panel: Recent Labs  Lab 09/07/18 0738 09/08/18 1642 09/09/18 0619 09/11/18 0504   NA 136 134* 137 137  K 2.6* 2.7* 4.0 4.5  CL 88* 89* 93* 92*  CO2 38* 37* 35* 36*  GLUCOSE 93 97 83 84  BUN <5* <5* <5* 7  CREATININE 0.48 0.37* 0.38* 0.44  CALCIUM 7.6* 7.5* 7.8* 8.0*  MG 1.9  --   --   --    Liver Function Tests: Recent Labs  Lab 09/07/18 0738 09/08/18 1642 09/09/18 0619 09/11/18 0504  AST 41 37 56* 38  ALT 14 15 14 12   ALKPHOS 130* 124 134* 128*  BILITOT 3.5* 3.1* 3.3* 3.0*  PROT 6.2* 6.0* 5.9* 5.6*  ALBUMIN 2.7* 2.6* 2.5* 2.2*   Recent Labs  Lab 09/08/18 1642 09/09/18 0619  LIPASE 55* 42   Recent Labs  Lab 09/08/18 1642 09/09/18 1022  AMMONIA 61* 56*   CBC: Recent Labs  Lab 09/07/18 0738 09/08/18 1642 09/09/18 0619  WBC 2.9* 3.0* 3.4*  NEUTROABS  --  1.7  --   HGB 9.1* 8.3* 8.8*  HCT 30.4* 27.3* 29.7*  MCV 89.4 90.4 92.8  PLT 99* 83* 71*   Cardiac Enzymes: No results for input(s): CKTOTAL, CKMB, CKMBINDEX, TROPONINI in the last 168 hours. BNP: Invalid input(s): POCBNP CBG: No results for input(s): GLUCAP in the last 168 hours. D-Dimer No results for input(s): DDIMER in the last 72 hours. Hgb A1c No results for input(s): HGBA1C in the last 72 hours. Lipid Profile No results for input(s): CHOL, HDL, LDLCALC, TRIG, CHOLHDL, LDLDIRECT in the last 72 hours. Thyroid function studies No results for input(s): TSH, T4TOTAL, T3FREE, THYROIDAB in the last 72 hours.  Invalid input(s): FREET3 Anemia work up Recent Labs    09/11/18 0504  VITAMINB12 917*   Urinalysis    Component Value Date/Time   COLORURINE AMBER (A) 08/10/2018 1840   APPEARANCEUR CLOUDY (A) 08/10/2018 1840   LABSPEC 1.015 08/10/2018 1840   PHURINE 5.0 08/10/2018 1840   GLUCOSEU NEGATIVE 08/10/2018 1840   HGBUR SMALL (A) 08/10/2018 1840   BILIRUBINUR NEGATIVE 08/10/2018 1840   BILIRUBINUR moderate (A) 06/19/2015 1906   KETONESUR NEGATIVE 08/10/2018 1840   PROTEINUR NEGATIVE 08/10/2018 1840   UROBILINOGEN >=8.0 06/19/2015 1906   UROBILINOGEN 0.2 09/14/2011  2141   NITRITE NEGATIVE 08/10/2018 1840   LEUKOCYTESUR MODERATE (A) 08/10/2018 1840   Sepsis Labs Invalid input(s): PROCALCITONIN,  WBC,  LACTICIDVEN Microbiology Recent Results (from the past 240 hour(s))  SARS Coronavirus 2 (CEPHEID - Performed in [facility] lab), Hosp Order     Status: None   Collection Time: 09/07/18  9:26 AM   Specimen: Nasopharyngeal Swab  Result Value Ref Range Status   SARS Coronavirus 2 NEGATIVE NEGATIVE Final    Comment: (NOTE) If result is NEGATIVE SARS-CoV-2 target nucleic acids are NOT DETECTED. The SARS-CoV-2 RNA is generally detectable in upper and lower  respiratory specimens during  the acute phase of infection. The lowest  concentration of SARS-CoV-2 viral copies this assay can detect is 250  copies / mL. A negative result does not preclude SARS-CoV-2 infection  and should not be used as the sole basis for treatment or other  patient management decisions.  A negative result may occur with  improper specimen collection / handling, submission of specimen other  than nasopharyngeal swab, presence of viral mutation(s) within the  areas targeted by this assay, and inadequate number of viral copies  (<250 copies / mL). A negative result must be combined with clinical  observations, patient history, and epidemiological information. If result is POSITIVE SARS-CoV-2 target nucleic acids are DETECTED. The SARS-CoV-2 RNA is generally detectable in upper and lower  respiratory specimens dur ing the acute phase of infection.  Positive  results are indicative of active infection with SARS-CoV-2.  Clinical  correlation with patient history and other diagnostic information is  necessary to determine patient infection status.  Positive results do  not rule out bacterial infection or co-infection with other viruses. If result is PRESUMPTIVE POSTIVE SARS-CoV-2 nucleic acids MAY BE PRESENT.   A presumptive positive result was obtained on the submitted  specimen  and confirmed on repeat testing.  While 2019 novel coronavirus  (SARS-CoV-2) nucleic acids may be present in the submitted sample  additional confirmatory testing may be necessary for epidemiological  and / or clinical management purposes  to differentiate between  SARS-CoV-2 and other Sarbecovirus currently known to infect humans.  If clinically indicated additional testing with an alternate test  methodology [phone]([id number]) is advised. The SARS-CoV-2 RNA is generally  detectable in upper and lower respiratory sp ecimens during the acute  phase of infection. The expected result is Negative. Fact Sheet for Patients:  BoilerBrush.com.cyhttps://www.fda.gov/media/136312/download Fact Sheet for Healthcare Providers: https://pope.com/https://www.fda.gov/media/136313/download This test is not yet approved or cleared by the [place] FDA and has been authorized for detection and/or diagnosis of SARS-CoV-2 by FDA under an Emergency Use Authorization (EUA).  This EUA will remain in effect (meaning this test can be used) for the duration of the COVID-19 declaration under Section 564(b)(1) of the Act, 21 U.S.C. section 360bbb-3(b)(1), unless the authorization is terminated or revoked sooner. Performed at [facility], [address] [place], [place] [address]   SARS Coronavirus 2 [facility](Hospital order, Performed in [facility] hospital lab)     Status: None   Collection Time: 09/08/18  7:45 PM  Result Value Ref Range Status   SARS Coronavirus 2 NEGATIVE NEGATIVE Final    Comment: (NOTE) If result is NEGATIVE SARS-CoV-2 target nucleic acids are NOT DETECTED. The SARS-CoV-2 RNA is generally detectable in upper and lower  respiratory specimens during the acute phase of infection. The lowest  concentration of SARS-CoV-2 viral copies this assay can detect is 250  copies / mL. A negative result does not preclude SARS-CoV-2 infection  and should not be used as the sole basis for treatment or other  patient  management decisions.  A negative result may occur with  improper specimen collection / handling, submission of specimen other  than nasopharyngeal swab, presence of viral mutation(s) within the  areas targeted by this assay, and inadequate number of viral copies  (<250 copies / mL). A negative result must be combined with clinical  observations, patient history, and epidemiological information. If result is POSITIVE SARS-CoV-2 target nucleic acids are DETECTED. The SARS-CoV-2 RNA is generally detectable in upper and lower  respiratory specimens dur ing the  acute phase of infection.  Positive  results are indicative of active infection with SARS-CoV-2.  Clinical  correlation with patient history and other diagnostic information is  necessary to determine patient infection status.  Positive results do  not rule out bacterial infection or co-infection with other viruses. If result is PRESUMPTIVE POSTIVE SARS-CoV-2 nucleic acids MAY BE PRESENT.   A presumptive positive result was obtained on the submitted specimen  and confirmed on repeat testing.  While 2019 novel coronavirus  (SARS-CoV-2) nucleic acids may be present in the submitted sample  additional confirmatory testing may be necessary for epidemiological  and / or clinical management purposes  to differentiate between  SARS-CoV-2 and other Sarbecovirus currently known to infect humans.  If clinically indicated additional testing with an alternate test  methodology [phone] is advised. The SARS-CoV-2 RNA is generally  detectable in upper and lower respiratory sp ecimens during the acute  phase of infection. The expected result is Negative. Fact Sheet for Patients:  BoilerBrush.com.cy Fact Sheet for Healthcare Providers: https://pope.com/ This test is not yet approved or cleared by the [place] FDA and has been authorized for detection and/or diagnosis of SARS-CoV-2 by FDA under  an Emergency Use Authorization (EUA).  This EUA will remain in effect (meaning this test can be used) for the duration of the COVID-19 declaration under Section 564(b)(1) of the Act, 21 U.S.C. section 360bbb-3(b)(1), unless the authorization is terminated or revoked sooner. Performed at [facility], [address] [place], [place] [address]      Time coordinating discharge: 32 minutes  SIGNED:   Kathlen Mody, MD  [facility] 09/12/2018, 11:54 AM Pager   If 7PM-7AM, please contact night-coverage [email or web address] Password [email or web address]

## 2018-09-12 NOTE — Care Management Important Message (Signed)
Important Message  Patient Details IM Letter given to Kathrin Greathouse SW to present to the Patient Name: Anne Black MRN: [id number] Date of Birth: [birth year]   Medicare Important Message Given:  Yes     Kerin Salen 09/12/2018, 11:06 AM

## 2018-09-12 NOTE — TOC Progression Note (Signed)
Transition of Care [facility]) - Progression Note    Patient Details  Name: Anne Black MRN: [id number] Date of Birth: 09/18/1960  Transition of Care [facility]) CM/SW [place], [place] Phone Number: 09/12/2018, 3:09 PM  Clinical Narrative:    COVID-19 test pending. CSW reached out to the patient spouse/ legal guardian. CSW provided SNF options.  CSW explain other SNF options. The legal guardian requests time to discuss SNF options with the patients adult children. CSW will continue for discharge needs.      Expected Discharge Plan and [place]          Expected Discharge Date: 09/12/18                                   Social Determinants of Health (SDOH) Interventions    Readmission Risk Interventions No flowsheet data found.

## 2018-09-12 NOTE — Progress Notes (Signed)
OT Cancellation Note  Patient Details Name: Anne Black MRN: [id number] DOB: 1960-07-22   Cancelled Treatment:    Reason Eval/Treat Not Completed: Other (comment)(Pt reporting nausea and declined OOB activity and bed level exercises. Notified RN of nausea. Provided pt with cool wash cloth on her forehead.)  [place], OTR/L Acute Rehab Pager: [phone] Office: [phone] 09/12/2018, 11:13 AM

## 2018-09-12 NOTE — Plan of Care (Signed)
  Problem: Education: Goal: Knowledge of General Education information will improve Description: Including pain rating scale, medication(s)/side effects and non-pharmacologic comfort measures Outcome: Not Progressing   Problem: Activity: Goal: Risk for activity intolerance will decrease Outcome: Not Progressing   Problem: Coping: Goal: Level of anxiety will decrease Outcome: Not Progressing   

## 2018-09-13 DIAGNOSIS — F10920 Alcohol use, unspecified with intoxication, uncomplicated: Secondary | ICD-10-CM

## 2018-09-13 DIAGNOSIS — E876 Hypokalemia: Secondary | ICD-10-CM

## 2018-09-13 DIAGNOSIS — F1020 Alcohol dependence, uncomplicated: Secondary | ICD-10-CM

## 2018-09-13 DIAGNOSIS — F419 Anxiety disorder, unspecified: Secondary | ICD-10-CM

## 2018-09-13 DIAGNOSIS — R109 Unspecified abdominal pain: Secondary | ICD-10-CM

## 2018-09-13 DIAGNOSIS — K219 Gastro-esophageal reflux disease without esophagitis: Secondary | ICD-10-CM

## 2018-09-13 NOTE — Progress Notes (Signed)
[facility].  PROGRESS NOTE    Ladon ApplebaumCarla S Belcher  ZOX:[id number] DOB: 08-Dec-1960 DOA: 09/08/2018 PCP: Patient, No Pcp Per   Brief Narrative:   CarlaHarveyis a58 y.o.female,Bipolar, ADD, Multiple Sclerosis, PAfib, Etoh dep, Alcholic cirrhosis w esophageal varices, and ascites, w h/o paracentesis, Lung nodule 5mm (inferior lingular), , Gerd, presents with c/o abdominal pain which is mostly epigastric in nature for the past 2 days. Patient states that she has some nausea but no vomiting. Her alcohol level was elevated. She was admitted for alcohol intoxication.    Assessment & Plan:   Principal Problem:   Abdominal pain Active Problems:   Depression   GERD (gastroesophageal reflux disease)   Tobacco abuse   Alcohol use disorder, moderate, dependence (HCC)   Anxiety   Protein-calorie malnutrition, severe (HCC)   Abdominal discomfort     - Possibly from ascites.      - US paracentesis ordered, about 6.6 liters of fluid taken out.      - She reports abd discomfort has improved.   Hypokalemia      - replete, monitor    Acute hepatic encephalopathy:      - Improved.     - Continue with lactulose   Liver cirrhosis leading to ascites:      - Resume lasix and spironolactone.      - US paracentesis ordered and >6 lit of fluid removed.    GERD     - Resume PPI.  Mild pancreatitis from alcohol intake     - Resolved.  Anemia of chronic disease:      - Hemoglobin stable around 8.   Severe protein calorie malnutrition:      - Resume prostat.   Hyperammonemia:      - Continue with lactulose.   Stable. Ok for discharge as per d/c summary from 09/12/2018.  DVT prophylaxis: SCDs Code Status: FULL Family Communication: None at bedside   Disposition Plan: To SNF   Procedures:   Paracentesis  ROS:  Denies abdominal pain, SOB, CP, N, V. Remainder 10-pt ROS is negative for all not previously mentioned.  Subjective: "I can stay here."  Objective: Vitals:   09/12/18  0615 09/12/18 1527 09/12/18 2114 09/13/18 0500  BP: (!) 101/50 (!) 104/56 (!) 102/55   Pulse: 87 90 94   Resp: 16 20 20    Temp: 98.2 F (36.8 C) 98.7 F (37.1 C) 99.9 F (37.7 C)   TempSrc: Axillary Oral Oral   SpO2: 100% 100% 100%   Weight: 74.5 kg   75.9 kg  Height:        Intake/Output Summary (Last 24 hours) at 09/13/2018 1214 Last data filed at 09/13/2018 0132 Gross per 24 hour  Intake 120 ml  Output 800 ml  Net -680 ml   Filed Weights   09/11/18 0551 09/12/18 0615 09/13/18 0500  Weight: 74.4 kg 74.5 kg 75.9 kg    Examination:  General: 58 y.o. female resting in bed in NAD Cardiovascular: RRR, +S1, S2, no m/g/r, equal pulses throughout Respiratory: CTABL, no w/r/r, normal WOB GI: BS+, Distended, but soft, NT, no masses noted, no organomegaly noted   Data Reviewed: I have personally reviewed following labs and imaging studies.  CBC: Recent Labs  Lab 09/07/18 0738 09/08/18 1642 09/09/18 0619  WBC 2.9* 3.0* 3.4*  NEUTROABS  --  1.7  --   HGB 9.1* 8.3* 8.8*  HCT 30.4* 27.3* 29.7*  MCV 89.4 90.4 92.8  PLT 99* 83* 71*   Basic Metabolic Panel:  Recent Labs  Lab 09/07/18 0738 09/08/18 1642 09/09/18 0619 09/11/18 0504  NA 136 134* 137 137  K 2.6* 2.7* 4.0 4.5  CL 88* 89* 93* 92*  CO2 38* 37* 35* 36*  GLUCOSE 93 97 83 84  BUN <5* <5* <5* 7  CREATININE 0.48 0.37* 0.38* 0.44  CALCIUM 7.6* 7.5* 7.8* 8.0*  MG 1.9  --   --   --    GFR: Estimated Creatinine Clearance: 73.1 mL/min (by C-G formula based on SCr of 0.44 mg/dL). Liver Function Tests: Recent Labs  Lab 09/07/18 0738 09/08/18 1642 09/09/18 0619 09/11/18 0504  AST 41 37 56* 38  ALT 14 15 14 12   ALKPHOS 130* 124 134* 128*  BILITOT 3.5* 3.1* 3.3* 3.0*  PROT 6.2* 6.0* 5.9* 5.6*  ALBUMIN 2.7* 2.6* 2.5* 2.2*   Recent Labs  Lab 09/08/18 1642 09/09/18 0619  LIPASE 55* 42   Recent Labs  Lab 09/08/18 1642 09/09/18 1022  AMMONIA 61* 56*   Coagulation Profile: No results for input(s): INR,  PROTIME in the last 168 hours. Cardiac Enzymes: No results for input(s): CKTOTAL, CKMB, CKMBINDEX, TROPONINI in the last 168 hours. BNP (last 3 results) No results for input(s): PROBNP in the last 8760 hours. HbA1C: No results for input(s): HGBA1C in the last 72 hours. CBG: No results for input(s): GLUCAP in the last 168 hours. Lipid Profile: No results for input(s): CHOL, HDL, LDLCALC, TRIG, CHOLHDL, LDLDIRECT in the last 72 hours. Thyroid Function Tests: No results for input(s): TSH, T4TOTAL, FREET4, T3FREE, THYROIDAB in the last 72 hours. Anemia Panel: Recent Labs    09/11/18 0504  VITAMINB12 917*   Sepsis Labs: No results for input(s): PROCALCITON, LATICACIDVEN in the last 168 hours.  Recent Results (from the past 240 hour(s))  SARS Coronavirus 2 (CEPHEID - Performed in [facility] hospital lab), Hosp Order     Status: None   Collection Time: 09/07/18  9:26 AM   Specimen: Nasopharyngeal Swab  Result Value Ref Range Status   SARS Coronavirus 2 NEGATIVE NEGATIVE Final    Comment: (NOTE) If result is NEGATIVE SARS-CoV-2 target nucleic acids are NOT DETECTED. The SARS-CoV-2 RNA is generally detectable in upper and lower  respiratory specimens during the acute phase of infection. The lowest  concentration of SARS-CoV-2 viral copies this assay can detect is 250  copies / mL. A negative result does not preclude SARS-CoV-2 infection  and should not be used as the sole basis for treatment or other  patient management decisions.  A negative result may occur with  improper specimen collection / handling, submission of specimen other  than nasopharyngeal swab, presence of viral mutation(s) within the  areas targeted by this assay, and inadequate number of viral copies  (<250 copies / mL). A negative result must be combined with clinical  observations, patient history, and epidemiological information. If result is POSITIVE SARS-CoV-2 target nucleic acids are DETECTED. The  SARS-CoV-2 RNA is generally detectable in upper and lower  respiratory specimens dur ing the acute phase of infection.  Positive  results are indicative of active infection with SARS-CoV-2.  Clinical  correlation with patient history and other diagnostic information is  necessary to determine patient infection status.  Positive results do  not rule out bacterial infection or co-infection with other viruses. If result is PRESUMPTIVE POSTIVE SARS-CoV-2 nucleic acids MAY BE PRESENT.   A presumptive positive result was obtained on the submitted specimen  and confirmed on repeat testing.  While 2019 novel coronavirus  (  SARS-CoV-2) nucleic acids may be present in the submitted sample  additional confirmatory testing may be necessary for epidemiological  and / or clinical management purposes  to differentiate between  SARS-CoV-2 and other Sarbecovirus currently known to infect humans.  If clinically indicated additional testing with an alternate test  methodology [phone]) is advised. The SARS-CoV-2 RNA is generally  detectable in upper and lower respiratory sp ecimens during the acute  phase of infection. The expected result is Negative. Fact Sheet for Patients:  BoilerBrush.com.cyhttps://www.fda.gov/media/136312/download Fact Sheet for Healthcare Providers: https://pope.com/https://www.fda.gov/media/136313/download This test is not yet approved or cleared by the [place] FDA and has been authorized for detection and/or diagnosis of SARS-CoV-2 by FDA under an Emergency Use Authorization (EUA).  This EUA will remain in effect (meaning this test can be used) for the duration of the COVID-19 declaration under Section 564(b)(1) of the Act, 21 U.S.C. section 360bbb-3(b)(1), unless the authorization is terminated or revoked sooner. Performed at [facility], [address] [place], [place] [address]   SARS Coronavirus 2 [facility](Hospital order, Performed in [facility] hospital lab)     Status: None    Collection Time: 09/08/18  7:45 PM  Result Value Ref Range Status   SARS Coronavirus 2 NEGATIVE NEGATIVE Final    Comment: (NOTE) If result is NEGATIVE SARS-CoV-2 target nucleic acids are NOT DETECTED. The SARS-CoV-2 RNA is generally detectable in upper and lower  respiratory specimens during the acute phase of infection. The lowest  concentration of SARS-CoV-2 viral copies this assay can detect is 250  copies / mL. A negative result does not preclude SARS-CoV-2 infection  and should not be used as the sole basis for treatment or other  patient management decisions.  A negative result may occur with  improper specimen collection / handling, submission of specimen other  than nasopharyngeal swab, presence of viral mutation(s) within the  areas targeted by this assay, and inadequate number of viral copies  (<250 copies / mL). A negative result must be combined with clinical  observations, patient history, and epidemiological information. If result is POSITIVE SARS-CoV-2 target nucleic acids are DETECTED. The SARS-CoV-2 RNA is generally detectable in upper and lower  respiratory specimens dur ing the acute phase of infection.  Positive  results are indicative of active infection with SARS-CoV-2.  Clinical  correlation with patient history and other diagnostic information is  necessary to determine patient infection status.  Positive results do  not rule out bacterial infection or co-infection with other viruses. If result is PRESUMPTIVE POSTIVE SARS-CoV-2 nucleic acids MAY BE PRESENT.   A presumptive positive result was obtained on the submitted specimen  and confirmed on repeat testing.  While 2019 novel coronavirus  (SARS-CoV-2) nucleic acids may be present in the submitted sample  additional confirmatory testing may be necessary for epidemiological  and / or clinical management purposes  to differentiate between  SARS-CoV-2 and other Sarbecovirus currently known to infect humans.   If clinically indicated additional testing with an alternate test  methodology [phone]) is advised. The SARS-CoV-2 RNA is generally  detectable in upper and lower respiratory sp ecimens during the acute  phase of infection. The expected result is Negative. Fact Sheet for Patients:  BoilerBrush.com.cyhttps://www.fda.gov/media/136312/download Fact Sheet for Healthcare Providers: https://pope.com/https://www.fda.gov/media/136313/download This test is not yet approved or cleared by the [place] FDA and has been authorized for detection and/or diagnosis of SARS-CoV-2 by FDA under an Emergency Use Authorization (EUA).  This EUA will remain in effect (meaning this test  can be used) for the duration of the COVID-19 declaration under Section 564(b)(1) of the Act, 21 U.S.C. section 360bbb-3(b)(1), unless the authorization is terminated or revoked sooner. Performed at [facility], [place] [address] [place], [place] [address]   SARS CORONAVIRUS 2 Nasal Swab Aptima Multi Swab     Status: None   Collection Time: 09/12/18  1:07 PM   Specimen: Aptima Multi Swab; Nasal Swab  Result Value Ref Range Status   SARS Coronavirus 2 NEGATIVE NEGATIVE Final    Comment: (NOTE) SARS-CoV-2 target nucleic acids are NOT DETECTED. The SARS-CoV-2 RNA is generally detectable in upper and lower respiratory specimens during the acute phase of infection. Negative results do not preclude SARS-CoV-2 infection, do not rule out co-infections with other pathogens, and should not be used as the sole basis for treatment or other patient management decisions. Negative results must be combined with clinical observations, patient history, and epidemiological information. The expected result is Negative. Fact Sheet for Patients: SugarRoll.be Fact Sheet for Healthcare Providers: https://www.woods-mathews.com/ This test is not yet approved or cleared by the [place] FDA and  has been  authorized for detection and/or diagnosis of SARS-CoV-2 by FDA under an Emergency Use Authorization (EUA). This EUA will remain  in effect (meaning this test can be used) for the duration of the COVID-19 declaration under Section 56 4(b)(1) of the Act, 21 U.S.C. section 360bbb-3(b)(1), unless the authorization is terminated or revoked sooner. Performed at [facility], [place] [address] [place], [place] [address]       Radiology Studies: No results found.   Scheduled Meds: . feeding supplement (PRO-STAT SUGAR FREE 64)  30 mL Oral BID  . folic acid  1 mg Oral Daily  . furosemide  40 mg Oral Daily  . gabapentin  200 mg Oral TID  . lactulose  20 g Oral BID  . multivitamin with minerals  1 tablet Oral Daily  . pantoprazole  40 mg Oral Daily  . potassium chloride SA  40 mEq Oral BID  . sodium chloride flush  3 mL Intravenous Q12H  . spironolactone  12.5 mg Oral Daily  . thiamine  100 mg Oral Daily   Or  . thiamine  100 mg Intravenous Daily   Continuous Infusions: . sodium chloride       LOS: 4 days    Time spent: 15 minutes spent in the coordination of care today.   Jonnie Finner, DO [facility] Pager [phone]  If 7PM-7AM, please contact night-coverage [email or web address] Password [email or web address] 09/13/2018, 12:14 PM

## 2018-09-13 NOTE — Progress Notes (Signed)
Occupational Therapy Treatment Patient Details Name: Anne Black MRN: [id number] DOB: 1961-01-28 Today's Date: 09/13/2018    History of present illness 58 yo female admitted with abd pain. Hx of multiple ED visits, ETOH cirrhosis, MS, ADD, bipolar d/o, COPD, A fib, chronic ascites   OT comments  Pt fatiques quickly. Did not have enough time to get to [facility] for urine. Stood for hygiene and to change bed pads   Follow Up Recommendations  SNF    Equipment Recommendations  3 in 1 bedside commode    Recommendations for Other Services      Precautions / Restrictions Precautions Precautions: Fall Precaution Comments: on 2-3L O2 at baseline Restrictions Weight Bearing Restrictions: No       Mobility Bed Mobility           Sit to supine: Min assist   General bed mobility comments: for trunk for OOB; pt got herself back into bed  Transfers   Equipment used: Rolling walker (2 wheeled)   Sit to Stand: Min guard              Balance     Sitting balance-Leahy Scale: Fair       Standing balance-Leahy Scale: Poor Standing balance comment: reliant upon RW                           ADL either performed or assessed with clinical judgement   ADL                               Toileting- Clothing Manipulation and Hygiene: Maximal assistance;Sit to/from stand         General ADL Comments: pt needed to use toilet and could not hold bladder long enough to get mats moved and rails down.  Stood for hygiene and to change pads under her.  Did not want to get OOB for lunch, but did want to eat ASAP. Fatiques easily     Manufacturing systems engineer      Cognition Arousal/Alertness: Awake/alert Behavior During Therapy: WFL for tasks assessed/performed Overall Cognitive Status: No family/caregiver present to determine baseline cognitive functioning                           Safety/Judgement: Decreased awareness of safety   Problem Solving: Requires verbal cues;Slow processing          Exercises     Shoulder Instructions       General Comments      Pertinent Vitals/ Pain       Pain Assessment: No/denies pain  Home Living                                          Prior Functioning/Environment              Frequency           Progress Toward Goals  OT Goals(current goals can now be found in the care plan section)  Progress towards OT goals: Progressing toward goals(slowly)     Plan      Co-evaluation                 AM-PAC OT "6 Clicks" Daily Activity  Outcome Measure   Help from another person eating meals?: None Help from another person taking care of personal grooming?: A Little Help from another person toileting, which includes using toliet, bedpan, or urinal?: A Lot Help from another person bathing (including washing, rinsing, drying)?: A Lot Help from another person to put on and taking off regular upper body clothing?: A Little Help from another person to put on and taking off regular lower body clothing?: A Lot 6 Click Score: 16    End of Session        Activity Tolerance Patient limited by fatigue   Patient Left in bed;with call bell/phone within reach;with bed alarm set   Nurse Communication          Time: 3013-1438 OT Time Calculation (min): 20 min  Charges: OT General Charges $OT Visit: 1 Visit OT Treatments $Self Care/Home Management : 8-22 mins  Marica Otter, OTR/L Acute Rehabilitation Services [phone] [facility] pager [phone] office 09/13/2018   Anne Black 09/13/2018, 2:03 PM

## 2018-09-13 NOTE — TOC Transition Note (Signed)
Transition of Care [facility]) - CM/SW Discharge Note   Patient Details  Name: BRIGGETTE NAJARIAN MRN: [id number] Date of Birth: [birth year]  [facility]) CM/SW Contact:  Wende Neighbors, LCSW Phone Number: 09/13/2018, 2:11 PM   Clinical Narrative:  Patient to Dc to [facility] and Rehab.Family aware of Dc plan. RN to please call [phone] (rm# [phone]) for report     Final next level of care: Skilled Nursing Facility Barriers to Discharge: No Barriers Identified   Patient Goals and CMS Choice        Discharge Placement                Patient to be transferred to facility by: ptar Name of family member notified: spouse Patient and family notified of of transfer: 09/13/18  Discharge Plan and Services                                     Social Determinants of Health (SDOH) Interventions     Readmission Risk Interventions No flowsheet data found.

## 2018-09-13 NOTE — Plan of Care (Signed)
  Problem: Education: Goal: Knowledge of General Education information will improve Description: Including pain rating scale, medication(s)/side effects and non-pharmacologic comfort measures 09/13/2018 1441 by Margarette Canada, RN Outcome: Adequate for Discharge 09/13/2018 1441 by Margarette Canada, RN Outcome: Adequate for Discharge   Problem: Health Behavior/Discharge Planning: Goal: Ability to manage health-related needs will improve 09/13/2018 1441 by Margarette Canada, RN Outcome: Adequate for Discharge 09/13/2018 1441 by Margarette Canada, RN Outcome: Adequate for Discharge   Problem: Clinical Measurements: Goal: Ability to maintain clinical measurements within normal limits will improve 09/13/2018 1441 by Margarette Canada, RN Outcome: Adequate for Discharge 09/13/2018 1441 by Margarette Canada, RN Outcome: Adequate for Discharge Goal: Will remain free from infection 09/13/2018 1441 by Margarette Canada, RN Outcome: Adequate for Discharge 09/13/2018 1441 by Margarette Canada, RN Outcome: Adequate for Discharge Goal: Diagnostic test results will improve 09/13/2018 1441 by Margarette Canada, RN Outcome: Adequate for Discharge 09/13/2018 1441 by Margarette Canada, RN Outcome: Adequate for Discharge Goal: Respiratory complications will improve 09/13/2018 1441 by Margarette Canada, RN Outcome: Adequate for Discharge 09/13/2018 1441 by Margarette Canada, RN Outcome: Adequate for Discharge Goal: Cardiovascular complication will be avoided 09/13/2018 1441 by Margarette Canada, RN Outcome: Adequate for Discharge 09/13/2018 1441 by Margarette Canada, RN Outcome: Adequate for Discharge   Problem: Activity: Goal: Risk for activity intolerance will decrease 09/13/2018 1441 by Margarette Canada, RN Outcome: Adequate for Discharge 09/13/2018 1441 by Margarette Canada, RN Outcome: Adequate for Discharge   Problem: Nutrition: Goal: Adequate nutrition will be maintained 09/13/2018 1441 by Margarette Canada, RN Outcome: Adequate for Discharge 09/13/2018 1441 by Margarette Canada, RN Outcome:  Adequate for Discharge   Problem: Coping: Goal: Level of anxiety will decrease 09/13/2018 1441 by Margarette Canada, RN Outcome: Adequate for Discharge 09/13/2018 1441 by Margarette Canada, RN Outcome: Adequate for Discharge   Problem: Elimination: Goal: Will not experience complications related to bowel motility 09/13/2018 1441 by Margarette Canada, RN Outcome: Adequate for Discharge 09/13/2018 1441 by Margarette Canada, RN Outcome: Adequate for Discharge   Problem: Pain Managment: Goal: General experience of comfort will improve 09/13/2018 1441 by Margarette Canada, RN Outcome: Adequate for Discharge 09/13/2018 1441 by Margarette Canada, RN Outcome: Adequate for Discharge

## 2018-09-13 NOTE — Progress Notes (Signed)
Report called to [place]. Stacey Drain

## 2018-09-14 ENCOUNTER — Inpatient Hospital Stay (INDEPENDENT_AMBULATORY_CARE_PROVIDER_SITE_OTHER): Payer: Self-pay | Admitting: Primary Care

## 2018-09-16 IMAGING — US US PARACENTESIS
1 series · 10 of 10 positions shown · non-contrast
Comparison: Previous paracentesis

MEDICATIONS:
10 cc 1% lidocaine

COMPLICATIONS:
None immediate.

INDICATION: Recurrent ascites

EXAM:
ULTRASOUND-GUIDED PARACENTESIS
TECHNIQUE: Informed written consent was obtained from the patient after a
discussion of the risks, benefits and alternatives to treatment. A
timeout was performed prior to the initiation of the procedure.

[Series 1: us paracentesis · 0.26mm/px · 10 of 10 slices shown]
[im 1/10]
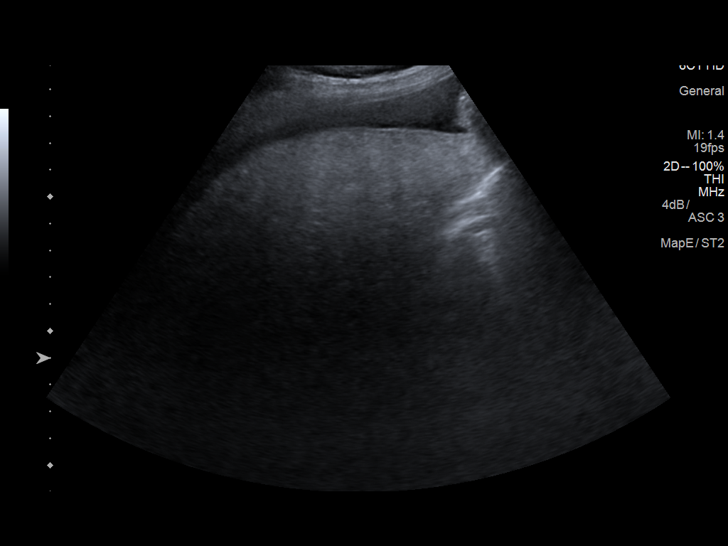
[im 2/10]
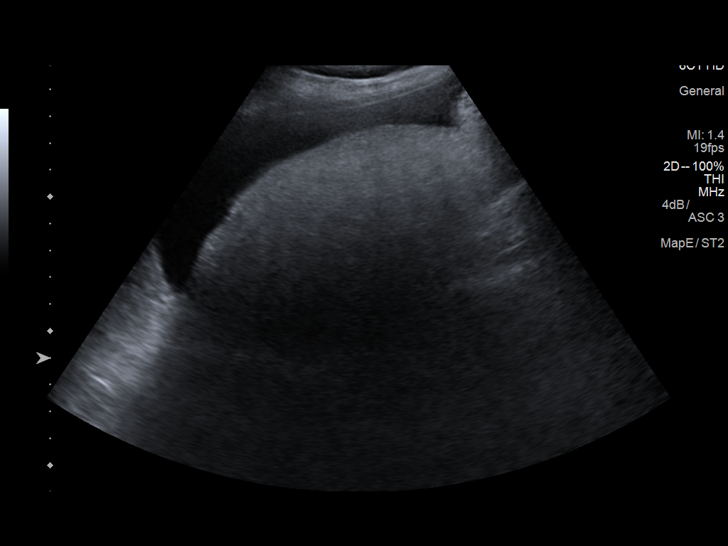
[im 3/10]
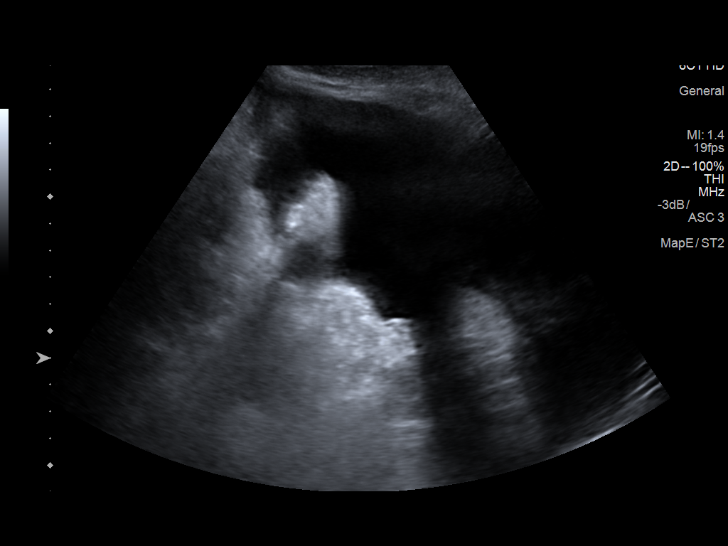
[im 4/10]
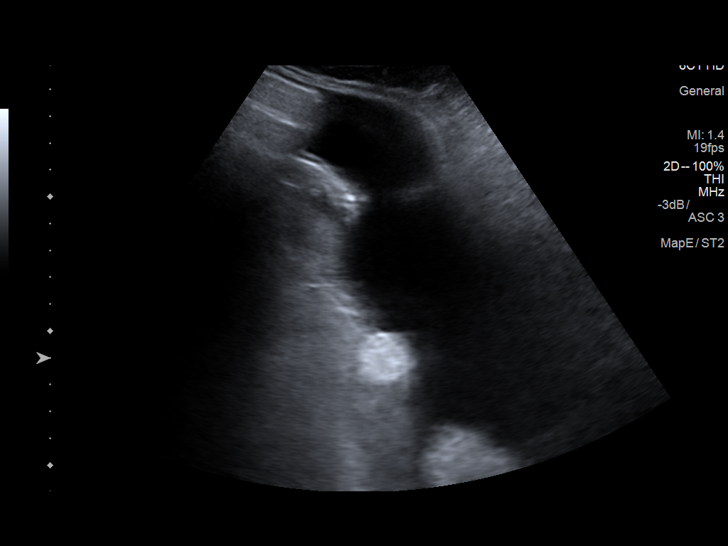
[im 5/10]
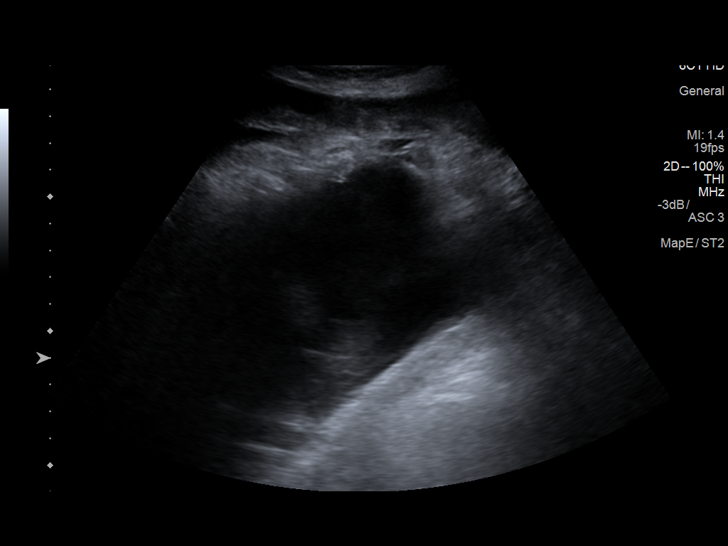
[im 6/10]
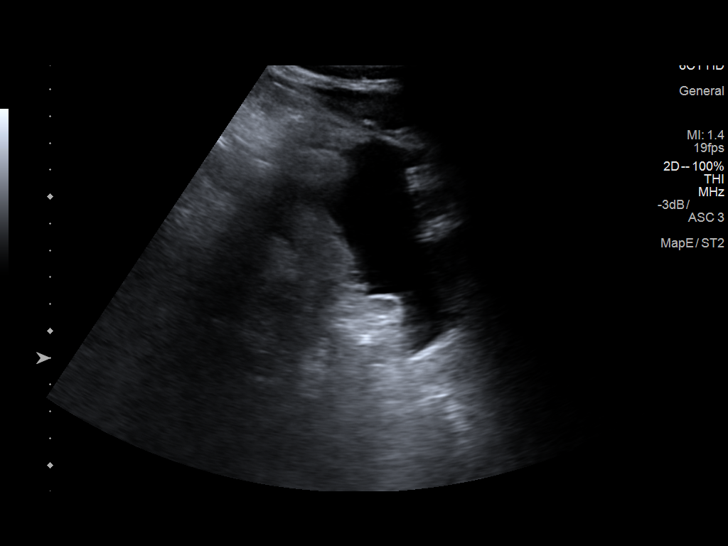
[im 7/10]
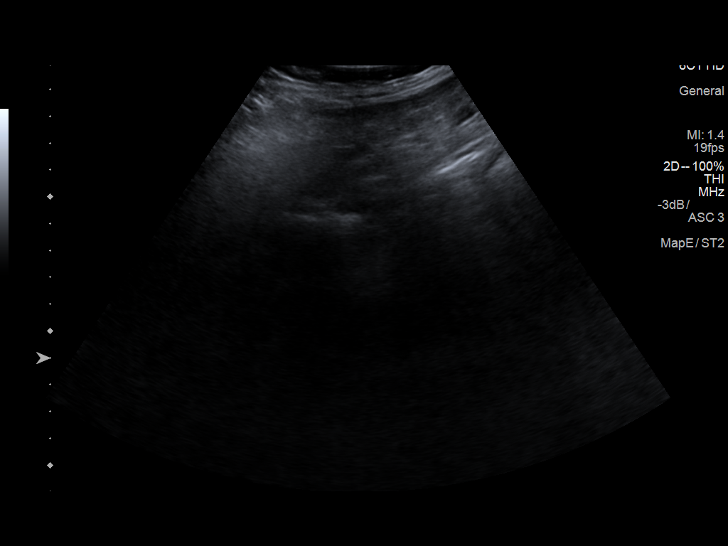
[im 8/10]
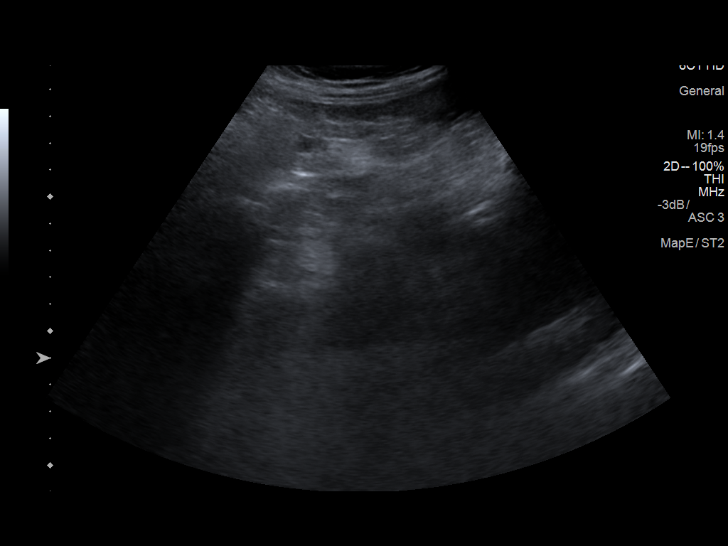
[im 9/10]
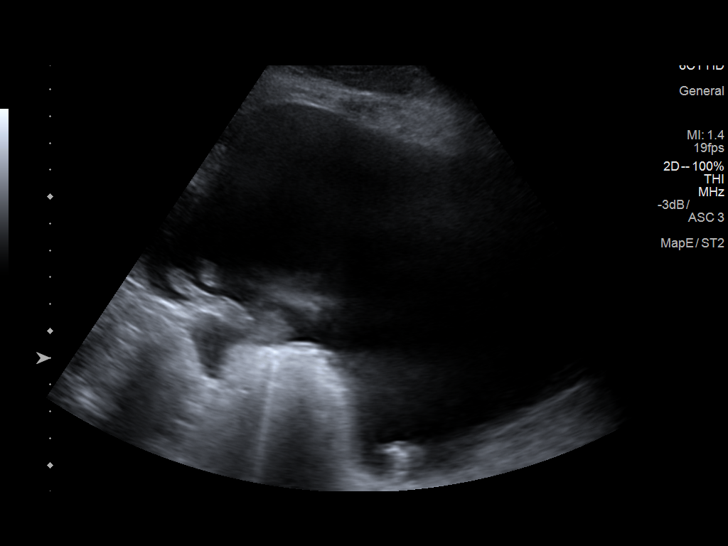
[im 10/10]
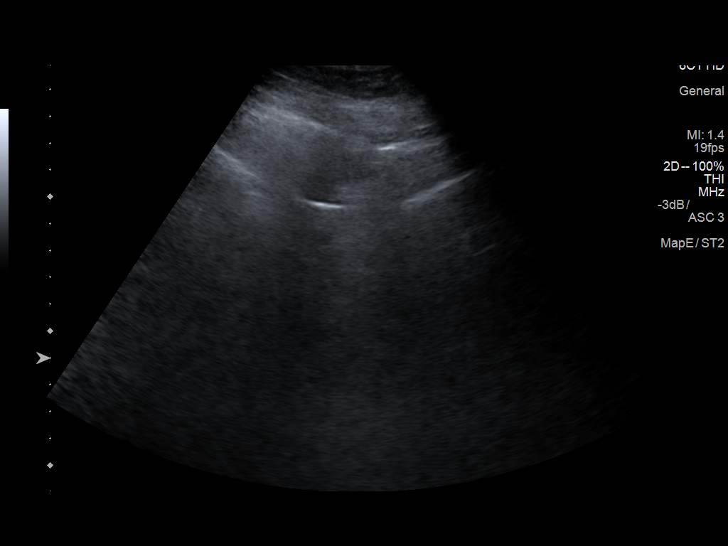

[10 of 10 positions shown; findings below may reference images not displayed]

Initial ultrasound scanning demonstrates a large amount of ascites
within the right lower abdominal quadrant. The right lower abdomen
was prepped and draped in the usual sterile fashion. 1% lidocaine
with epinephrine was used for local anesthesia. Under direct
ultrasound guidance, a 19 gauge, 7-cm, Yueh catheter was introduced.
An ultrasound image was saved for documentation purposed. The
paracentesis was performed. The catheter was removed and a dressing
was applied. The patient tolerated the procedure well without
immediate post procedural complication.
FINDINGS: A total of approximately 3 liters of yellow fluid was removed.
Samples were sent to the laboratory as requested by the clinical
team.
IMPRESSION: Successful ultrasound-guided paracentesis yielding 3 liters of
peritoneal fluid.

Read by

Ole Georg Marskar

## 2018-09-20 ENCOUNTER — Other Ambulatory Visit: Payer: Self-pay

## 2018-09-20 ENCOUNTER — Emergency Department (HOSPITAL_COMMUNITY)
Admission: EM | Admit: 2018-09-20 | Discharge: 2018-10-09 | Payer: Self-pay | Attending: Emergency Medicine | Admitting: Emergency Medicine

## 2018-09-20 DIAGNOSIS — J449 Chronic obstructive pulmonary disease, unspecified: Secondary | ICD-10-CM | POA: Insufficient documentation

## 2018-09-20 DIAGNOSIS — F319 Bipolar disorder, unspecified: Secondary | ICD-10-CM | POA: Insufficient documentation

## 2018-09-20 DIAGNOSIS — F17210 Nicotine dependence, cigarettes, uncomplicated: Secondary | ICD-10-CM | POA: Insufficient documentation

## 2018-09-20 DIAGNOSIS — R079 Chest pain, unspecified: Secondary | ICD-10-CM

## 2018-09-20 DIAGNOSIS — Z79899 Other long term (current) drug therapy: Secondary | ICD-10-CM | POA: Insufficient documentation

## 2018-09-20 DIAGNOSIS — K7031 Alcoholic cirrhosis of liver with ascites: Secondary | ICD-10-CM | POA: Insufficient documentation

## 2018-09-20 DIAGNOSIS — F1997 Other psychoactive substance use, unspecified with psychoactive substance-induced persisting dementia: Secondary | ICD-10-CM

## 2018-09-20 DIAGNOSIS — Z20828 Contact with and (suspected) exposure to other viral communicable diseases: Secondary | ICD-10-CM | POA: Insufficient documentation

## 2018-09-20 DIAGNOSIS — Z008 Encounter for other general examination: Secondary | ICD-10-CM

## 2018-09-20 DIAGNOSIS — F1020 Alcohol dependence, uncomplicated: Secondary | ICD-10-CM | POA: Insufficient documentation

## 2018-09-20 NOTE — ED Notes (Addendum)
Pt wanded by security, however still proceed with caution, pt bedbound unable to fully scan without interference.

## 2018-09-20 NOTE — ED Triage Notes (Signed)
Pt bib ptar and [facility] from [facility] with reports of diarrhea, diaper rash, hemorrhoids, edema BLE and ascites. Pt with IVC papers as well.

## 2018-09-20 NOTE — ED Notes (Signed)
Pt refused labdraw

## 2018-09-20 NOTE — ED Notes (Addendum)
Pt placed on purewick. Aware UA needed. 

## 2018-09-21 LAB — CBC WITH DIFFERENTIAL/PLATELET
Abs Immature Granulocytes: 0.01 10*3/uL (ref 0.00–0.07)
Basophils Absolute: 0.1 10*3/uL (ref 0.0–0.1)
Basophils Relative: 2 %
Eosinophils Absolute: 0 10*3/uL (ref 0.0–0.5)
Eosinophils Relative: 0 %
HCT: 27.2 % — ABNORMAL LOW (ref 36.0–46.0)
Hemoglobin: 8.6 g/dL — ABNORMAL LOW (ref 12.0–15.0)
Immature Granulocytes: 0 %
Lymphocytes Relative: 19 %
Lymphs Abs: 0.6 10*3/uL — ABNORMAL LOW (ref 0.7–4.0)
MCH: 29.3 pg (ref 26.0–34.0)
MCHC: 31.6 g/dL (ref 30.0–36.0)
MCV: 92.5 fL (ref 80.0–100.0)
Monocytes Absolute: 0.5 10*3/uL (ref 0.1–1.0)
Monocytes Relative: 16 %
Neutro Abs: 2.2 10*3/uL (ref 1.7–7.7)
Neutrophils Relative %: 63 %
Platelets: 146 10*3/uL — ABNORMAL LOW (ref 150–400)
RBC: 2.94 MIL/uL — ABNORMAL LOW (ref 3.87–5.11)
RDW: 25.1 % — ABNORMAL HIGH (ref 11.5–15.5)
WBC: 3.4 10*3/uL — ABNORMAL LOW (ref 4.0–10.5)
nRBC: 0 % (ref 0.0–0.2)

## 2018-09-21 LAB — URINALYSIS, ROUTINE W REFLEX MICROSCOPIC
Bilirubin Urine: NEGATIVE
Glucose, UA: NEGATIVE mg/dL
Hgb urine dipstick: NEGATIVE
Ketones, ur: NEGATIVE mg/dL
Leukocytes,Ua: NEGATIVE
Nitrite: NEGATIVE
Protein, ur: NEGATIVE mg/dL
Specific Gravity, Urine: 1.008 (ref 1.005–1.030)
pH: 6 (ref 5.0–8.0)

## 2018-09-21 LAB — ACETAMINOPHEN LEVEL: Acetaminophen (Tylenol), Serum: 16 ug/mL (ref 10–30)

## 2018-09-21 LAB — COMPREHENSIVE METABOLIC PANEL
ALT: 14 U/L (ref 0–44)
AST: 31 U/L (ref 15–41)
Albumin: 2.1 g/dL — ABNORMAL LOW (ref 3.5–5.0)
Alkaline Phosphatase: 103 U/L (ref 38–126)
Anion gap: 8 (ref 5–15)
BUN: <5 mg/dL — ABNORMAL LOW (ref 6–20)
CO2: 30 mmol/L (ref 22–32)
Calcium: 7.6 mg/dL — ABNORMAL LOW (ref 8.9–10.3)
Chloride: 96 mmol/L — ABNORMAL LOW (ref 98–111)
Creatinine, Ser: 0.6 mg/dL (ref 0.44–1.00)
GFR calc Af Amer: >60 mL/min (ref >60)
GFR calc non Af Amer: >60 mL/min (ref >60)
Glucose, Bld: 95 mg/dL (ref 70–99)
Potassium: 3.6 mmol/L (ref 3.5–5.1)
Sodium: 134 mmol/L — ABNORMAL LOW (ref 135–145)
Total Bilirubin: 1.9 mg/dL — ABNORMAL HIGH (ref 0.3–1.2)
Total Protein: 5.8 g/dL — ABNORMAL LOW (ref 6.5–8.1)

## 2018-09-21 LAB — AMMONIA: Ammonia: 62 umol/L — ABNORMAL HIGH (ref 9–35)

## 2018-09-21 LAB — ETHANOL: Alcohol, Ethyl (B): <10 mg/dL (ref <10)

## 2018-09-21 LAB — RAPID URINE DRUG SCREEN, HOSP PERFORMED
Amphetamines: NOT DETECTED
Barbiturates: NOT DETECTED
Benzodiazepines: POSITIVE — AB
Cocaine: NOT DETECTED
Opiates: NOT DETECTED
Tetrahydrocannabinol: NOT DETECTED

## 2018-09-21 LAB — I-STAT BETA HCG BLOOD, ED (MC, WL, AP ONLY): I-stat hCG, quantitative: <5 m[IU]/mL (ref <5)

## 2018-09-21 LAB — SARS CORONAVIRUS 2 BY RT PCR (HOSPITAL ORDER, PERFORMED IN ~~LOC~~ HOSPITAL LAB): SARS Coronavirus 2: NEGATIVE

## 2018-09-21 LAB — SALICYLATE LEVEL: Salicylate Lvl: <7 mg/dL (ref 2.8–30.0)

## 2018-09-21 MED ORDER — LORAZEPAM 2 MG/ML IJ SOLN: 1.0000 mg | Freq: Once | INTRAMUSCULAR | Status: DC

## 2018-09-21 MED ORDER — ONDANSETRON 4 MG PO TBDP
8.0000 mg | ORAL_TABLET | Freq: Three times a day (TID) | ORAL | Status: DC | PRN
  Administered 2018-09-21 – 2018-10-08 (×10): 8 mg via ORAL
  Filled 2018-09-21 (×14): qty 2

## 2018-09-21 MED ORDER — GABAPENTIN 100 MG PO CAPS
200.0000 mg | ORAL_CAPSULE | Freq: Three times a day (TID) | ORAL | Status: DC
  Administered 2018-09-21 – 2018-09-22 (×4): 200 mg via ORAL
  Administered 2018-09-23: 100 mg via ORAL
  Administered 2018-09-23 – 2018-10-09 (×41): 200 mg via ORAL
  Filled 2018-09-21 (×52): qty 2

## 2018-09-21 MED ORDER — LACTULOSE 10 GM/15ML PO SOLN
20.0000 g | Freq: Two times a day (BID) | ORAL | Status: DC
  Administered 2018-09-27 – 2018-10-01 (×4): 20 g via ORAL
  Filled 2018-09-21 (×13): qty 30

## 2018-09-21 MED ORDER — LORAZEPAM 1 MG PO TABS
2.0000 mg | ORAL_TABLET | Freq: Once | ORAL | Status: AC
  Administered 2018-09-21: 2 mg via ORAL
  Filled 2018-09-21: qty 2

## 2018-09-21 MED ORDER — LORAZEPAM 2 MG/ML IJ SOLN
0.0000 mg | Freq: Four times a day (QID) | INTRAMUSCULAR | Status: AC
  Administered 2018-09-22: 2 mg via INTRAVENOUS
  Filled 2018-09-21 (×2): qty 1

## 2018-09-21 MED ORDER — LORAZEPAM 2 MG/ML IJ SOLN
1.0000 mg | Freq: Once | INTRAMUSCULAR | Status: DC
  Filled 2018-09-21 (×2): qty 1

## 2018-09-21 MED ORDER — LORAZEPAM 1 MG PO TABS
0.0000 mg | ORAL_TABLET | Freq: Two times a day (BID) | ORAL | Status: AC
  Administered 2018-09-24: 1 mg via ORAL
  Administered 2018-09-24 – 2018-09-25 (×2): 2 mg via ORAL
  Administered 2018-09-25: 1 mg via ORAL
  Filled 2018-09-21 (×2): qty 1
  Filled 2018-09-21 (×2): qty 2

## 2018-09-21 MED ORDER — FUROSEMIDE 20 MG PO TABS
40.0000 mg | ORAL_TABLET | Freq: Every day | ORAL | Status: DC
  Administered 2018-09-21 – 2018-10-09 (×19): 40 mg via ORAL
  Filled 2018-09-21 (×19): qty 2

## 2018-09-21 MED ORDER — PRO-STAT SUGAR FREE PO LIQD
30.0000 mL | Freq: Two times a day (BID) | ORAL | Status: DC
  Administered 2018-09-29 – 2018-10-05 (×6): 30 mL via ORAL
  Filled 2018-09-21 (×22): qty 30

## 2018-09-21 MED ORDER — HYDROXYZINE HCL 25 MG PO TABS
25.0000 mg | ORAL_TABLET | Freq: Every day | ORAL | Status: DC | PRN
  Administered 2018-09-21 – 2018-10-07 (×20): 25 mg via ORAL
  Filled 2018-09-21 (×26): qty 1

## 2018-09-21 MED ORDER — ACETAMINOPHEN 325 MG PO TABS
650.0000 mg | ORAL_TABLET | ORAL | Status: DC | PRN
  Administered 2018-09-21 – 2018-10-08 (×36): 650 mg via ORAL
  Filled 2018-09-21 (×38): qty 2

## 2018-09-21 MED ORDER — LORAZEPAM 2 MG/ML IJ SOLN
2.0000 mg | Freq: Once | INTRAMUSCULAR | Status: AC
  Administered 2018-09-21: 16:00:00 2 mg via INTRAMUSCULAR
  Filled 2018-09-21: qty 1

## 2018-09-21 MED ORDER — PANTOPRAZOLE SODIUM 40 MG PO TBEC
40.0000 mg | DELAYED_RELEASE_TABLET | Freq: Every day | ORAL | Status: DC
  Administered 2018-09-21 – 2018-10-09 (×19): 40 mg via ORAL
  Filled 2018-09-21 (×19): qty 1

## 2018-09-21 MED ORDER — FOLIC ACID 1 MG PO TABS
1.0000 mg | ORAL_TABLET | Freq: Every day | ORAL | Status: DC
  Administered 2018-09-21 – 2018-10-08 (×18): 1 mg via ORAL
  Filled 2018-09-21 (×19): qty 1

## 2018-09-21 MED ORDER — LORAZEPAM 1 MG PO TABS
0.0000 mg | ORAL_TABLET | Freq: Four times a day (QID) | ORAL | Status: AC
  Administered 2018-09-21 – 2018-09-23 (×5): 1 mg via ORAL
  Filled 2018-09-21: qty 2
  Filled 2018-09-21 (×2): qty 1
  Filled 2018-09-21: qty 2

## 2018-09-21 MED ORDER — VITAMIN B-1 100 MG PO TABS
100.0000 mg | ORAL_TABLET | Freq: Every day | ORAL | Status: DC
  Administered 2018-09-21 – 2018-10-09 (×19): 100 mg via ORAL
  Filled 2018-09-21 (×20): qty 1

## 2018-09-21 MED ORDER — ADULT MULTIVITAMIN W/MINERALS CH
1.0000 | ORAL_TABLET | Freq: Every day | ORAL | Status: DC
  Administered 2018-09-21 – 2018-10-08 (×17): 1 via ORAL
  Filled 2018-09-21 (×19): qty 1

## 2018-09-21 MED ORDER — LORAZEPAM 2 MG/ML IJ SOLN: 0.0000 mg | Freq: Two times a day (BID) | INTRAMUSCULAR | Status: AC

## 2018-09-21 NOTE — ED Notes (Addendum)
RN attempted to call husband to come signs papers as petitioner for IVC. Informed MD that still attempting to contact him. Danielys remains agitated. Declining ativan injection

## 2018-09-21 NOTE — ED Notes (Signed)
Breakfast ordered 

## 2018-09-21 NOTE — ED Notes (Signed)
ED TO INPATIENT HANDOFF REPORT  ED Nurse Name and Phone #: Osborne CascoNadia, RN  S Name/Age/Gender Anne Applebaumarla S Black 58 y.o. female Room/Bed: 037C/037C  Code Status   Code Status: Full Code  Home/SNF/Other Skilled nursing facility Patient oriented to: self and place Is this baseline? yes  Triage Complete: Triage complete  Chief Complaint Diarrhea, IVC  Triage Note Pt bib ptar and G[facility] from [facility] with reports of diarrhea, diaper rash, hemorrhoids, edema BLE and ascites. Pt with IVC papers as well.   Allergies No Known Allergies  Level of Care/Admitting Diagnosis ED Disposition    None      B Medical/Surgery History Past Medical History:  Diagnosis Date  . ADHD (attention deficit hyperactivity disorder)   . Alcoholic cirrhosis (HCC)    with ascites  . Alcoholism (HCC)   . Allergies   . Anxiety   . Arthritis   . Atrial fibrillation with RVR (HCC)   . Bipolar disorder (HCC)   . COPD (chronic obstructive pulmonary disease) (HCC)    wears 2L chronic O2  . Dyspnea   . EtOH dependence (HCC) 05/14/2018  . GERD (gastroesophageal reflux disease)   . History of hiatal hernia   . Hypokalemia   . Migraine   . Multiple sclerosis (HCC)   . Narcolepsy   . Osteoarthritis of knee   . Osteoporosis   . Pneumonia   . UTI (urinary tract infection) 08/11/2018   Past Surgical History:  Procedure Laterality Date  . ABDOMINAL WALL DEFECT REPAIR N/A 12/30/2016   Procedure: EXPLORATORY LAPAROTOMY WITH REPAIR ABDOMINAL WALL VENTRAL HERNIA;  Surgeon: Emelia LoronWakefield, Matthew, MD;  [facility];  Service: General;  Laterality: N/A;  . APPLICATION OF WOUND VAC N/A 12/30/2016   Procedure: APPLICATION OF WOUND VAC;  Surgeon: Emelia LoronWakefield, Matthew, MD;  [facility];  Service: General;  Laterality: N/A;  . CESAREAN SECTION  [id number]  . FRACTURE SURGERY    . HERNIA REPAIR    . IR PARACENTESIS  06/14/2018  . IR PARACENTESIS  08/14/2018  . IR PARACENTESIS  08/18/2018  . MYRINGOTOMY WITH TUBE  PLACEMENT Bilateral   . ORIF WRIST FRACTURE Left 09/09/2016   Procedure: OPEN REDUCTION INTERNAL FIXATION (ORIF) LEFT WRIST FRACTURE, LEFT CARPAL TUNNEL RELEASE;  Surgeon: Dominica SeverinGramig, William, MD;  Location: [facility] ORS;  Service: Orthopedics;  Laterality: Left;  . TONSILLECTOMY       A IV Location/Drains/Wounds Patient Lines/Drains/Airways Status   Active Line/Drains/Airways    Name:   Placement date:   Placement time:   Site:   Days:   External Urinary Catheter   09/08/18    2315    -   13          Intake/Output Last 24 hours  Intake/Output Summary (Last 24 hours) at 09/21/2018 1449 Last data filed at 09/21/2018 1400 Gross per 24 hour  Intake 240 ml  Output 1000 ml  Net -760 ml    Labs/Imaging Results for orders placed or performed during the hospital encounter of 09/20/18 (from the past 48 hour(s))  Rapid urine drug screen (hospital performed)     Status: Abnormal   Collection Time: 09/21/18 12:15 AM  Result Value Ref Range   Opiates NONE DETECTED NONE DETECTED   Cocaine NONE DETECTED NONE DETECTED   Benzodiazepines POSITIVE (A) NONE DETECTED   Amphetamines NONE DETECTED NONE DETECTED   Tetrahydrocannabinol NONE DETECTED NONE DETECTED   Barbiturates NONE DETECTED NONE DETECTED    Comment: (NOTE) DRUG SCREEN FOR MEDICAL PURPOSES ONLY.  IF CONFIRMATION IS NEEDED FOR ANY PURPOSE, NOTIFY LAB WITHIN 5 DAYS. LOWEST DETECTABLE LIMITS FOR URINE DRUG SCREEN Drug Class                     Cutoff (ng/mL) Amphetamine and metabolites    1000 Barbiturate and metabolites    200 Benzodiazepine                 200 Tricyclics and metabolites     300 Opiates and metabolites        300 Cocaine and metabolites        300 THC                            50 Performed at [facility], [address] [place], [place] [address]   Urinalysis, Routine w reflex microscopic     Status: None   Collection Time: 09/21/18 12:15 AM  Result Value Ref Range   Color, Urine YELLOW YELLOW    APPearance CLEAR CLEAR   Specific Gravity, Urine 1.008 1.005 - 1.030   pH 6.0 5.0 - 8.0   Glucose, UA NEGATIVE NEGATIVE mg/dL   Hgb urine dipstick NEGATIVE NEGATIVE   Bilirubin Urine NEGATIVE NEGATIVE   Ketones, ur NEGATIVE NEGATIVE mg/dL   Protein, ur NEGATIVE NEGATIVE mg/dL   Nitrite NEGATIVE NEGATIVE   Leukocytes,Ua NEGATIVE NEGATIVE    Comment: Performed at [facility], [address] [place], [place] [address]  Comprehensive metabolic panel     Status: Abnormal   Collection Time: 09/21/18 12:20 AM  Result Value Ref Range   Sodium 134 (L) 135 - 145 mmol/L   Potassium 3.6 3.5 - 5.1 mmol/L   Chloride 96 (L) 98 - 111 mmol/L   CO2 30 22 - 32 mmol/L   Glucose, Bld 95 70 - 99 mg/dL   BUN <5 (L) 6 - 20 mg/dL   Creatinine, Ser 0.980.60 0.44 - 1.00 mg/dL   Calcium 7.6 (L) 8.9 - 10.3 mg/dL   Total Protein 5.8 (L) 6.5 - 8.1 g/dL   Albumin 2.1 (L) 3.5 - 5.0 g/dL   AST 31 15 - 41 U/L   ALT 14 0 - 44 U/L   Alkaline Phosphatase 103 38 - 126 U/L   Total Bilirubin 1.9 (H) 0.3 - 1.2 mg/dL   GFR calc non Af Amer >60 >60 mL/min   GFR calc Af Amer >60 >60 mL/min   Anion gap 8 5 - 15    Comment: Performed at [facility], [address] [place], [place] [address]  Ethanol     Status: None   Collection Time: 09/21/18 12:20 AM  Result Value Ref Range   Alcohol, Ethyl (B) <10 <10 mg/dL    Comment: (NOTE) Lowest detectable limit for serum alcohol is 10 mg/dL. For medical purposes only. Performed at [facility], [address] [place], [place] [address]   Salicylate level     Status: None   Collection Time: 09/21/18 12:20 AM  Result Value Ref Range   Salicylate Lvl <7.0 2.8 - 30.0 mg/dL    Comment: Performed at [facility], [address] [place], [place] [address]  Acetaminophen level     Status: None   Collection Time: 09/21/18 12:20 AM  Result Value Ref Range   Acetaminophen (Tylenol), Serum 16 10 - 30 ug/mL    Comment: (NOTE) Therapeutic concentrations vary  significantly. A  range of 10-30 ug/mL  may be an effective concentration for many patients. However, some  are best treated at concentrations outside of this range. Acetaminophen concentrations >150 ug/mL at 4 hours after ingestion  and >50 ug/mL at 12 hours after ingestion are often associated with  toxic reactions. Performed at [facility], [address], [place], [place] [address]   CBC with Differential     Status: Abnormal   Collection Time: 09/21/18 12:20 AM  Result Value Ref Range   WBC 3.4 (L) 4.0 - 10.5 K/uL   RBC 2.94 (L) 3.87 - 5.11 MIL/uL   Hemoglobin 8.6 (L) 12.0 - 15.0 g/dL   HCT 86.7 (L) 67.2 - 09.4 %   MCV 92.5 80.0 - 100.0 fL   MCH 29.3 26.0 - 34.0 pg   MCHC 31.6 30.0 - 36.0 g/dL   RDW 70.9 (H) 62.8 - 36.6 %   Platelets 146 (L) 150 - 400 K/uL   nRBC 0.0 0.0 - 0.2 %   Neutrophils Relative % 63 %   Neutro Abs 2.2 1.7 - 7.7 K/uL   Lymphocytes Relative 19 %   Lymphs Abs 0.6 (L) 0.7 - 4.0 K/uL   Monocytes Relative 16 %   Monocytes Absolute 0.5 0.1 - 1.0 K/uL   Eosinophils Relative 0 %   Eosinophils Absolute 0.0 0.0 - 0.5 K/uL   Basophils Relative 2 %   Basophils Absolute 0.1 0.0 - 0.1 K/uL   Immature Granulocytes 0 %   Abs Immature Granulocytes 0.01 0.00 - 0.07 K/uL    Comment: Performed at [facility], [address] [place], [place] [address]  I-Stat beta hCG blood, ED     Status: None   Collection Time: 09/21/18 12:38 AM  Result Value Ref Range   I-stat hCG, quantitative <5.0 <5 mIU/mL   Comment 3            Comment:   GEST. AGE      CONC.  (mIU/mL)   <=1 WEEK        5 - 50     2 WEEKS       50 - 500     3 WEEKS       100 - 10,000     4 WEEKS     1,000 - 30,000        FEMALE AND NON-PREGNANT FEMALE:     LESS THAN 5 mIU/mL   Ammonia     Status: Abnormal   Collection Time: 09/21/18 12:39 AM  Result Value Ref Range   Ammonia 62 (H) 9 - 35 umol/L    Comment: Performed at [facility], [address], [place], [place] [address]   No  results found.  Pending Labs [employer] (From admission, onward)    Start     Ordered   09/21/18 1440  SARS Coronavirus 2 [facility] order, Performed in [facility] hospital lab) Nasopharyngeal Nasopharyngeal Swab  (Symptomatic/High Risk of Exposure/Tier 1 Patients Labs with Precautions)  Once,   STAT    Question Answer Comment  Is this test for diagnosis or screening Screening   Symptomatic for COVID-19 as defined by CDC No   Hospitalized for COVID-19 No   Admitted to ICU for COVID-19 No   Previously tested for COVID-19 Yes   Resident in a congregate (group) care setting No   Employed in healthcare setting No   Pregnant No      09/21/18 1439  Vitals/Pain Today's Vitals   09/21/18 0700 09/21/18 0921 09/21/18 1152 09/21/18 1411  BP: 99/63   116/63  Pulse: 86   96  Resp: 14   16  SpO2: 98%   99%  PainSc:  6  Asleep     Isolation Precautions Airborne and Contact precautions  Medications Medications  acetaminophen (TYLENOL) tablet 650 mg (650 mg Oral Given 09/21/18 0922)  feeding supplement (PRO-STAT SUGAR FREE 64) liquid 30 mL (30 mLs Oral Not Given 09/03/18 3559)  folic acid (FOLVITE) tablet 1 mg (1 mg Oral Given 09/21/18 1432)  furosemide (LASIX) tablet 40 mg (40 mg Oral Given 09/21/18 1011)  gabapentin (NEURONTIN) capsule 200 mg (200 mg Oral Not Given 09/21/18 1431)  hydrOXYzine (ATARAX/VISTARIL) tablet 25 mg (25 mg Oral Given 09/21/18 0425)  lactulose (CHRONULAC) 10 GM/15ML solution 20 g (20 g Oral Not Given 09/21/18 1430)  pantoprazole (PROTONIX) EC tablet 40 mg (40 mg Oral Given 09/21/18 1012)  ondansetron (ZOFRAN-ODT) disintegrating tablet 8 mg (8 mg Oral Given 09/21/18 0424)  multivitamin with minerals tablet 1 tablet (1 tablet Oral Given 09/21/18 1012)  thiamine (VITAMIN B-1) tablet 100 mg (100 mg Oral Given 09/21/18 1012)  LORazepam (ATIVAN) tablet 2 mg (2 mg Oral Given 09/21/18 1012)    Mobility non-ambulatory High fall risk   Focused Assessments Neuro  Assessment Handoff:  Swallow screen pass? Yes  Cardiac Rhythm: Normal sinus rhythm       Neuro Assessment: Exceptions to WDL Neuro Checks:      Last Documented NIHSS Modified Score:   Has TPA been given? No If patient is a Neuro Trauma and patient is going to OR before floor call report to [place] nurse: [phone] or [phone]     R Recommendations: See Admitting Provider Note  Report given to:   Additional Notes:

## 2018-09-21 NOTE — ED Notes (Signed)
Pt found in hallway looking for RN. Patient claiming she needs to use the bathroom and was looking for somebody to speak to. RN cautions pt about leaving bed without assitance and the risk it poses to patient own health. Patient understands but is adamant about speaking with someone.

## 2018-09-21 NOTE — ED Provider Notes (Signed)
  Physical Exam  BP (!) 95/52   Pulse 85   Resp 17   SpO2 100%   Physical Exam  ED Course/Procedures     Procedures  MDM   Assuming care of patient from Dr. Vallery Ridge.   Patient in the ED for diarrhea and behavioral issues.  She resides at a skilled nursing facility.  IVC completed by husband as she is abusive towards the staff. Workup shows no acute abdominal discomfort.  Patient is at baseline with her mental status.  Important pending results are lab analysis  According to Dr. Vallery Ridge, plan is to follow-up on the lab results, which if clear then she can be assessed for psych evaluation.  Patient had no complains, no concerns from the nursing side. Will continue to monitor. Nursing staff reported that patient has soiled the toilet.    Varney Biles, MD 09/21/18 [phone]

## 2018-09-21 NOTE — Progress Notes (Addendum)
Pt meets inpatient criteria per Lindell Spar, NP. Referral information has been sent to the following hospitals for review:  [facility]  [facility]  Disposition will continue to assist with inpatient placement needs.   Audree Camel, LCSW, [facility] [phone]

## 2018-09-21 NOTE — BH Assessment (Signed)
Clinician attempted to complete pt's BH Assessment but pt stated she couldn't participate at this time.because she was tired and not feeling well. Will attempt again at a later time.

## 2018-09-21 NOTE — ED Notes (Signed)
Staff at [facility] called and staff told that "she was incompetent" and has been physical aggressive- tries to swing at staff, pulls away, throws herself on the floor to use her as tactic to manipulate. She can walk to wheelchair to bathroom but selects when she will walk. She will wheel her chair right up to people in threatening manor. Staff reports that she has delusions that she is better than staff and everyone is less than she is.

## 2018-09-21 NOTE — ED Notes (Signed)
Pt yelling out wanting pain meds for headache, Pt offered tylenol but pt refused.

## 2018-09-21 NOTE — ED Notes (Signed)
Patient is resting comfortably. 

## 2018-09-21 NOTE — ED Provider Notes (Addendum)
I was asked to complete first exam for IVC. This has been done. Patient is resting comfortably without distress.   Sherwood Gambler, MD 09/21/18 [phone]  Nurse reports she is agitated, and didn't sleep much last night. Has order for IM ativan, but is refusing. She's not a current threat/violent patient at this time, will try po ativan.   Sherwood Gambler, MD 09/21/18 1002

## 2018-09-21 NOTE — ED Notes (Signed)
Lunch Tray Ordered @ 0954-per Percell Locus, RN called by Levada Dy

## 2018-09-21 NOTE — ED Notes (Signed)
Pt resting quietly at this time with eyes closed.  Sitter remains at bedside.
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
## 2018-09-21 NOTE — ED Notes (Signed)
Pt demanding to speak to [employer] and attorney. She denies being verbally and physically aggressive with them. Requesting to see her records. RN established boundaries with pt and explained that is not possible.

## 2018-09-21 NOTE — ED Provider Notes (Signed)
I received call from nurse requesting as needed orders for agitation.  They report the patient is yelling in the hallways, and generally being uncooperative.  Chart review shows that patient has a significant history of alcohol abuse.  Nurse reports that CIWA orders are not currently in place, and that when patient showed up she reportedly came with a "four loko" alcoholic beverage.  Nurse also reports that patient is slightly shaky.  CIWA protocol orders placed.   Lorin Glass, PA-C 09/22/18 0001    Quintella Reichert, MD 09/22/18 [phone]

## 2018-09-21 NOTE — ED Notes (Signed)
Pt resting comfortably at this time.

## 2018-09-21 NOTE — ED Notes (Addendum)
Pt yelling this NT's name states want to talk to attorney. This NT notified pt that pt gets 2 phone calls with 5 minute duration pt states "that isn't long enough and don't want to waste a phone call". Pt wants to know when pt can leave or go to [place], this NT notified when the social worker calls over the TTS to ask those questions. Pt called attorney and was satisfied. Pt still wants to talk to a Education officer, museum and go to [place], pt yelling.

## 2018-09-22 MED ORDER — STERILE WATER FOR INJECTION IJ SOLN
INTRAMUSCULAR | Status: AC
  Administered 2018-09-22: 10 mL
  Filled 2018-09-22: qty 10

## 2018-09-22 MED ORDER — ZIPRASIDONE MESYLATE 20 MG IM SOLR
20.0000 mg | Freq: Once | INTRAMUSCULAR | Status: AC
  Administered 2018-09-22: 20 mg via INTRAMUSCULAR
  Filled 2018-09-22: qty 20

## 2018-09-22 MED ORDER — HYDROCORTISONE ACETATE 25 MG RE SUPP
25.0000 mg | Freq: Two times a day (BID) | RECTAL | Status: DC | PRN
  Filled 2018-09-22: qty 1

## 2018-09-22 MED ORDER — HYDROCORTISONE ACETATE 25 MG RE SUPP
25.0000 mg | Freq: Once | RECTAL | Status: DC
  Filled 2018-09-22: qty 1

## 2018-09-22 MED ORDER — HYDROCORTISONE (PERIANAL) 2.5 % EX CREA
TOPICAL_CREAM | Freq: Two times a day (BID) | CUTANEOUS | Status: DC | PRN
  Administered 2018-09-23: 1 via RECTAL
  Administered 2018-09-24 – 2018-10-03 (×6): via RECTAL
  Filled 2018-09-22 (×3): qty 28.35

## 2018-09-22 MED ORDER — HYDROCORTISONE ACETATE 25 MG RE SUPP
25.0000 mg | Freq: Once | RECTAL | Status: AC
  Administered 2018-09-22: 14:00:00 25 mg via RECTAL
  Filled 2018-09-22: qty 1

## 2018-09-22 NOTE — ED Notes (Signed)
Dinner tray called 

## 2018-09-22 NOTE — ED Notes (Signed)
Pt requesting pain med when I  Went in to [facility] her where she was hurting she was asleep    I did not wake her up

## 2018-09-22 NOTE — ED Notes (Signed)
Warm blankets given.

## 2018-09-22 NOTE — ED Notes (Signed)
Pt stating that she "can't breathe." Oxygen sat 99% on 2L [place]

## 2018-09-22 NOTE — ED Notes (Signed)
Pt still waiting on her  Hemorrhoid cream

## 2018-09-22 NOTE — ED Notes (Signed)
Pt requesting a pure wick  It has been placed  Now asking for pain med.

## 2018-09-22 NOTE — ED Notes (Signed)
Applied purewick on pt., RN agreed too after pt. Asked for it.

## 2018-09-22 NOTE — ED Notes (Signed)
Pt. Yelling for an ER doctor. The writer, Sharyn Lull NT, the RN, and the officer explained that she could not see the doctor until her assessment has been done. Pt. Still very agitated.

## 2018-09-22 NOTE — ED Notes (Signed)
The pt reports that she has hemorrhoids and would like some cream for that
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
## 2018-09-22 NOTE — ED Notes (Signed)
Hemorrhoid pain

## 2018-09-22 NOTE — ED Notes (Signed)
Pt continuously asking for something to drink. Pt has had 5 cups of soda and water in the past few hours. Pt continuously getting up to go to the bathroom. Told pt she is unable to have anything else right now and that she needs to attempt to go to sleep. Pt restless and frequently yelling out in the hallway that she needs "help" with odds and ends in her room.

## 2018-09-22 NOTE — BH Assessment (Signed)
[place] Assessment Progress Note Per [place] NP patient continues to meet inpatient criteria as appropriate bed placement is investigated.

## 2018-09-22 NOTE — ED Notes (Addendum)
Pt refused other 1mg  of Ativan from original order. Pt increasingly agitated and getting up out of bed yelling. Gave pt other 1mg  of Ativan IM with [employer] holding pt's arm. Pt did not fight against security. Informed pt that since she was IVCed [employer] could intervene on a necessary basis.

## 2018-09-22 NOTE — ED Notes (Signed)
Pt just had a shower  Loud otherwise

## 2018-09-22 NOTE — ED Notes (Signed)
Unknown if pt has family or friends inquiring about her

## 2018-09-22 NOTE — ED Notes (Addendum)
Pt began screaming that she "wants to leave and go to [facility] or [place] because I'm in pain and I'm not being treated for my MS." informed pt she was here for psych issues and pt began arguing that was not what she was here for. Pt spoke with Camera operator and continued arguing. Pt given CIWA orders for Ativan for increased anxiety, agitation, and shaking

## 2018-09-22 NOTE — ED Notes (Signed)
Behavorial called and reports that the pt continues to meet state criteria

## 2018-09-23 ENCOUNTER — Emergency Department (HOSPITAL_COMMUNITY): Payer: Self-pay

## 2018-09-23 NOTE — ED Notes (Signed)
Sitter brought patient to the desk with bleeding skin tear; Sitter reports that patient started picking at skin because she wanted to ask the nurse a question but the nurse was busy at the time; skin tear has bleeding controlled and wound dressed with gauze; Patient has disorganized thought pattern and asked RN if she can have transfer to a facility in [place].-Monique,RN

## 2018-09-23 NOTE — ED Notes (Signed)
Pt noted to be asking for multiple items - individually. Pt given Tylenol as requested. Pt asking for pitcher of unsweet tea. Advised pt unable to provide. Pt noted to be attempting to be argumentative.

## 2018-09-23 NOTE — Progress Notes (Signed)
CSW spoke with admissions at [facility]. Patient had been discharged from the facility. CSW clarified patient was not sent from facility to [facility] directly as a patient but was discharged from their care prior to arriving at [facility].  Lamonte Richer, LCSW, [place] Worker II [phone]

## 2018-09-23 NOTE — ED Notes (Signed)
Pt continues to be talkative to [place] - voice is noted to be lower than previously.

## 2018-09-23 NOTE — ED Notes (Signed)
Pt refusing Lactulose and Pro-Stat even after much encouragement.

## 2018-09-23 NOTE — Progress Notes (Signed)
Patient ID: Anne Black, female   DOB: Oct 26, 1960, 58 y.o.   MRN: [id number]  Pt was seen and chart reviewed with treatment team and Dr Dwyane Dee. Pt has a legal guardian, her husband, Elham Fini. He was contacted by TTS and gave collateral that Pt is at her baseline. She has been residing at [facility] but they are unwilling to take her back. Pt will need SW assistance for placement. Consult order will be placed. Pt is psychiatrically cleared. Case staffed with Dr Dwyane Dee.   Ethelene Hal, NP-C 09/23/2018        1514

## 2018-09-23 NOTE — ED Notes (Signed)
Pt asking for salad - RN advised pt unable to provide - pt then continued asking. Pt noted to continue w/repetitive questions and statements.

## 2018-09-23 NOTE — ED Notes (Signed)
Pt has been Sales executive - Order received for SW Consult for SNF placement d/t [facility] will not accept pt back.

## 2018-09-23 NOTE — ED Notes (Signed)
Pt able to ambulate to bathroom and back to room w/o difficulty.

## 2018-09-23 NOTE — ED Notes (Signed)
Pt asking for "plain piece of chicken" from staff member who brought her tray, from RN - who advised pt no special orders, and then from [place] repeatedly. Advised pt she may have [place] sandwich if needed - declined. Soft drink given as requested.

## 2018-09-23 NOTE — Progress Notes (Signed)
CSW received consult for SNF placement. CSW reached out to [place] to discuss legal guardian report that patient may not return to clarify the situation.  Lamonte Richer, LCSW, [place] Worker II [phone]

## 2018-09-23 NOTE — ED Notes (Signed)
Pt ambulated to bathroom and back to room w/walker w/o difficulty. Pt repeatedly asking for walker w/wheels - advised pt there is not one available at this time. Pt asking for channel on tv to be changed continuously after asking for it to be turned to certain channel. Pt asking for nurses' reading glasses so she can see remote. Advised pt staff will change channels for pt.

## 2018-09-23 NOTE — ED Notes (Signed)
Diet was ordered for Lunch. 

## 2018-09-23 NOTE — ED Notes (Signed)
Pt given Ativan d/t pt remains to appear manic - asking same repetitive questions continuously. Pt asking for Nicotine Patch - states she has not smoked in weeks - not given. Pt given ice chips as requested.

## 2018-09-23 NOTE — BH Assessment (Addendum)
[place] Assessment Progress Note This Probation officer spoke with patient this date to evaluate current mental health status. Patient denies any S/I, H/I or AVH. Patient is oriented x 4 and is requesting to be discharged after her "medical issues" are resolved. Patient is here from [facility] with IVC due to altered mental status and aggressive behavior. Patient was initially assessed on 09/21/18 and was recommended for a inpatient admission at that time. This Probation officer spoke with patient's guardian Anne Black (Husband) [phone] who reported patient was informed she could no longer return to [facility] once patient is cleared by psychiatry due to current behaviors. Husband stated that he is unsure of new level of care once patient is discharged since she no longer can reside at their residence. Husband is requesting a consult from [place] to assist with placement once patient has been cleared by psychiatry.  Disposition pending staffing with provider.

## 2018-09-23 NOTE — TOC Initial Note (Signed)
Transition of Care [facility](TOC) - Initial/Assessment Note    Patient Details  Name: Anne ApplebaumCarla S Black MRN: [id number] Date of Birth: 10/04/1960  Transition of Care [facility](TOC) CM/SW Contact:    Annalee GentaJoey R Ceili Boshers, LCSW Phone Number: 09/23/2018, 4:34 PM  Clinical Narrative:  CSW consulted regarding SNF placement. CSW spoke with [facility] and noted patient had been discharged by facility prior to her returning to the emergency department. CSW spoke with patient's legal guardian and noted the family was unable to support patient in living independently at this time and wanted to pursue SNF placement. CSW discussed referral process for SNF and discussed backup options. CSW noted patient would be able to financially pay for a locked memory care unit if SNF is not recommended or not available.              Expected Discharge Plan: Skilled Nursing Facility Barriers to Discharge: Insurance Authorization   Patient Goals and CMS Choice Patient states their goals for this hospitalization and ongoing recovery are:: "I don't want to be in the hospital." CMS Medicare.gov Compare Post Acute Care list provided to:: Legal Guardian    Expected Discharge Plan and Services Expected Discharge Plan: Skilled Nursing Facility       Living arrangements for the past 2 months: Skilled Nursing Facility, Single Family Home                                      Prior Living Arrangements/Services Living arrangements for the past 2 months: Skilled Nursing Facility, Single Family Home Lives with:: Self, Facility Resident Patient language and need for interpreter reviewed:: No Do you feel safe going back to the place where you live?: Yes      Need for Family Participation in Patient Care: Yes (Comment) Care giver support system in place?: Yes (comment)   Criminal Activity/Legal Involvement Pertinent to Current Situation/Hospitalization: No - Comment as needed  Activities of Daily Living Home Assistive  Devices/Equipment: None ADL Screening (condition at time of admission) Patient's cognitive ability adequate to safely complete daily activities?: Yes Is the patient deaf or have difficulty hearing?: No Does the patient have difficulty seeing, even when wearing glasses/contacts?: No Does the patient have difficulty concentrating, remembering, or making decisions?: No Patient able to express need for assistance with ADLs?: Yes Does the patient have difficulty dressing or bathing?: No Independently performs ADLs?: Yes (appropriate for developmental age) Does the patient have difficulty walking or climbing stairs?: No Weakness of Legs: None Weakness of Arms/Hands: None  Permission Sought/Granted Permission sought to share information with : Facility Medical sales representativeContact Representative, Sports coachCase Manager, Guardian Permission granted to share information with : Yes, Verbal Permission Granted     Permission granted to share info w AGENCY: Facility contacts for referral        Emotional Assessment Appearance:: Appears stated age Attitude/Demeanor/Rapport: Inconsistent Affect (typically observed): Restless Orientation: : Oriented to Self, Oriented to Place, Oriented to Situation, Oriented to  Time Alcohol / Substance Use: Not Applicable Psych Involvement: No (comment)  Admission diagnosis:  Diarrhea, IVC Patient Active Problem List   Diagnosis Date Noted  . ADHD (attention deficit hyperactivity disorder)   . Anemia 08/16/2018  . Palliative care by specialist   . Cellulitis of lower extremity   . Peripheral edema   . Atrial fibrillation with RVR (HCC) 06/13/2018  . Alcohol dependence (HCC) 05/06/2018  . Electrolyte disturbance 05/05/2018  . Alcohol withdrawal delirium (HCC)  04/28/2018  . Weakness generalized 04/27/2018  . Gait abnormality 04/24/2018  . Chronic hypoxemic respiratory failure ([place]) 04/11/2018  . Atrial fibrillation with rapid ventricular response ([place]) 03/23/2018  . Head injury   .  Fall 03/19/2018  . Syncope 03/19/2018  . Orbital fracture 03/19/2018  . Scalp laceration 03/19/2018  . Acute respiratory failure with hypoxia and hypercarbia ([place]) 03/19/2018  . Abdominal tenderness 03/19/2018  . Acute on chronic respiratory failure with hypoxia ([place]) 03/17/2018  . Alcohol abuse with intoxication ([place]) 03/17/2018  . ETOH abuse   . Normocytic anemia 03/16/2018  . Acute metabolic encephalopathy 44/04/4740  . Lower extremity edema 03/16/2018  . Migraine 03/16/2018  . A-fib ([place]) 02/18/2018  . Community acquired pneumonia of left lower lobe of lung ([place])   . Weakness 08/21/2017  . COPD exacerbation ([place]) 06/12/2017  . Chest pain with moderate risk for cardiac etiology 05/30/2017  . Abnormal LFTs 05/30/2017  . Tachycardia 05/30/2017  . COPD (chronic obstructive pulmonary disease) ([place]) 04/19/2017  . Right rib fracture 04/15/2017  . Abdominal wall cellulitis 12/30/2016  . S/P exploratory laparotomy 12/30/2016  . Distal radius fracture, left 09/09/2016  . Thrombocytopenia ([place]) 09/09/2016  . Chest x-ray abnormality   . Umbilical hernia without obstruction and without gangrene 05/12/2016  . Itching 02/24/2016  . Ventral hernia without obstruction or gangrene 02/24/2016  . Goals of care, counseling/discussion   . Ascites   . Tachypnea   . Atrial flutter with rapid ventricular response ([place])   . Hypomagnesemia   . Hypoxia   . Dehydration 01/22/2016  . Low back ache 12/30/2015  . Non-intractable vomiting with nausea   . Alcohol abuse with alcohol-induced mood disorder ([place]) 12/15/2015  . Acute alcoholism ([place])   . Distended abdomen 11/04/2015  . Alcohol abuse   . Dyspnea   . Acute respiratory failure ([place]) 10/15/2015  . Ascites due to alcoholic cirrhosis ([place]) 59/56/3875  . Osteoporosis   . Multiple sclerosis ([place])   . Alcoholic cirrhosis of liver with ascites ([place]) 08/22/2015  . Bipolar disorder, current episode mixed, moderate ([place]) 08/01/2015  . Alcohol use  disorder, severe, dependence ([place]) 07/31/2015  . Macrocytic anemia- due to alcohol abuse with normal B12 & folate levels 05/31/2015  . Protein-calorie malnutrition, severe ([place]) 05/31/2015  . Hypokalemia 05/26/2015  . Alcohol withdrawal ([place]) 05/18/2015  . Abdominal pain 05/18/2015  . Anxiety 05/18/2015  . Stimulant abuse ([place]) 12/27/2014  . Nicotine abuse 12/27/2014  . Hyperprolactinemia ([place]) 11/15/2014  . Dyslipidemia   . Alcohol use disorder, moderate, dependence ([place]) 10/30/2014  . Tobacco abuse 01/23/2014  . Bipolar 1 disorder ([place]) 04/07/2013  . Noncompliance with therapeutic plan 04/04/2013  . Hereditary and idiopathic peripheral neuropathy 05/01/2012  . Depression 05/01/2012  . GERD (gastroesophageal reflux disease) 05/01/2012  . Osteoarthrosis, unspecified whether generalized or localized, involving lower leg 05/01/2012  . Hyponatremia 07/01/2011   PCP:  Patient, No Pcp Per Pharmacy:   [facility] [place], [place] - [address] [place] [address] [place] [address] Phone: [phone] Fax: [phone]     Social Determinants of Health (SDOH) Interventions    Readmission Risk Interventions No flowsheet data found.

## 2018-09-23 NOTE — ED Notes (Signed)
Pt awake and noted to be talking loudly continuously. Asking repetitive questions.

## 2018-09-23 NOTE — ED Notes (Signed)
Patient was given Diet Ginger Ale. A Regular Diet was ordered for Dinner.

## 2018-09-23 NOTE — ED Notes (Addendum)
Pt awake - asking for Tylenol. Pt noted w/O2 infusing at 2L/min via n/c.

## 2018-09-23 NOTE — BH Assessment (Addendum)
[facility] Assessment Progress Note Patient per Bobby Rumpf NP will be cleared by psychiatry this date and social work will be consulted to assist with placement issues. Guardian Jaylenne Hamelin was contacted to inform of disposition.

## 2018-09-24 LAB — CBC WITH DIFFERENTIAL/PLATELET
Abs Immature Granulocytes: 0.01 10*3/uL (ref 0.00–0.07)
Basophils Absolute: 0.1 10*3/uL (ref 0.0–0.1)
Basophils Relative: 2 %
Eosinophils Absolute: 0 10*3/uL (ref 0.0–0.5)
Eosinophils Relative: 0 %
HCT: 29.5 % — ABNORMAL LOW (ref 36.0–46.0)
Hemoglobin: 9.1 g/dL — ABNORMAL LOW (ref 12.0–15.0)
Immature Granulocytes: 0 %
Lymphocytes Relative: 19 %
Lymphs Abs: 0.6 10*3/uL — ABNORMAL LOW (ref 0.7–4.0)
MCH: 28.8 pg (ref 26.0–34.0)
MCHC: 30.8 g/dL (ref 30.0–36.0)
MCV: 93.4 fL (ref 80.0–100.0)
Monocytes Absolute: 0.4 10*3/uL (ref 0.1–1.0)
Monocytes Relative: 12 %
Neutro Abs: 2.2 10*3/uL (ref 1.7–7.7)
Neutrophils Relative %: 67 %
Platelets: 163 10*3/uL (ref 150–400)
RBC: 3.16 MIL/uL — ABNORMAL LOW (ref 3.87–5.11)
RDW: 23.7 % — ABNORMAL HIGH (ref 11.5–15.5)
WBC: 3.3 10*3/uL — ABNORMAL LOW (ref 4.0–10.5)
nRBC: 0 % (ref 0.0–0.2)

## 2018-09-24 LAB — LIPASE, BLOOD: Lipase: 37 U/L (ref 11–51)

## 2018-09-24 MED ORDER — POTASSIUM CHLORIDE CRYS ER 20 MEQ PO TBCR
40.0000 meq | EXTENDED_RELEASE_TABLET | Freq: Once | ORAL | Status: AC
  Administered 2018-09-24: 40 meq via ORAL
  Filled 2018-09-24: qty 2

## 2018-09-24 MED ORDER — BACITRACIN ZINC 500 UNIT/GM EX OINT
TOPICAL_OINTMENT | Freq: Two times a day (BID) | CUTANEOUS | Status: DC
  Administered 2018-09-24: 1 via TOPICAL
  Administered 2018-09-25: 13:00:00 via TOPICAL
  Filled 2018-09-24: qty 0.9

## 2018-09-24 MED ORDER — ALUM & MAG HYDROXIDE-SIMETH 200-200-20 MG/5ML PO SUSP
30.0000 mL | Freq: Once | ORAL | Status: AC
  Administered 2018-09-24: 30 mL via ORAL
  Filled 2018-09-24: qty 30

## 2018-09-24 MED ORDER — POTASSIUM CHLORIDE 10 MEQ/100ML IV SOLN: 10.0000 meq | Freq: Once | INTRAVENOUS | Status: DC

## 2018-09-24 MED ORDER — ALUM & MAG HYDROXIDE-SIMETH 200-200-20 MG/5ML PO SUSP
30.0000 mL | Freq: Once | ORAL | Status: AC
  Administered 2018-09-25: 04:00:00 30 mL via ORAL
  Filled 2018-09-24: qty 30

## 2018-09-24 NOTE — ED Notes (Signed)
Pt is yelling at me and she is so rude all day . She tell me that she don't like and  want to request another sitter

## 2018-09-24 NOTE — ED Notes (Signed)
After pt soiled herself w/urine, she asked for bedside commode. Pt being cleansed.

## 2018-09-24 NOTE — ED Notes (Addendum)
Pt c/o pain to her hemorrhoid. Yelling out from room. Told pt not to yell out and that was unacceptable. Pt crying and yelling stating "I want to see my daughter" "When can I leave here?". Repeating that no one has seen her today for her abd pain. Made aware that the PA did come see her today. Pt states that I need to put cream on her hemorrhoids. Told pt that she can put the cream on them and that I will give it to her and she can do it. Pt requesting a bedpan. Told pt that she is ambulatory and that she needed to get up and walk to the restroom.

## 2018-09-24 NOTE — ED Notes (Signed)
Message sent to L Layden, PA, Lab called - K+ 2.6.

## 2018-09-24 NOTE — ED Notes (Signed)
Pt ambulated to restroom without difficulty

## 2018-09-24 NOTE — ED Notes (Signed)
Pt c/o abd pain and diarrhea x 3. Abd appears to be distended - unsure what is normal for pt. Message sent to B [place], [place].

## 2018-09-24 NOTE — ED Notes (Signed)
Pt continues to refuse Lactulose and Pro-Stat even after much encouragement.

## 2018-09-24 NOTE — ED Notes (Signed)
Pt ambulated to bathroom and back to room w/walker. Pt asked to make phone call - Pt attempting to make phone call from nurses' desk.

## 2018-09-24 NOTE — ED Notes (Signed)
Pt asking for bedpan rather than ambulating to bathroom. Encouraging pt to ambulate.

## 2018-09-24 NOTE — ED Notes (Signed)
Pt requesting for new Sitter - stating she does not like this Sitter assigned to her. Advised pt Sitter is to remain w/her. Pt continues to ask for multiple things continuously. Pt asking for GI Cocktail. Advised pt will ask Layden, PA - note sent. Pt aware of tx plan - seeking SNF/Assisted Living. Pt asking for RN to contact certain facilities for her. Advised pt SW working on this.

## 2018-09-24 NOTE — ED Notes (Signed)
Pt given orange juice and encouraged to drink it - Potassium IVPB to be discontinued by Carlota Raspberry, PA.

## 2018-09-24 NOTE — ED Provider Notes (Signed)
ED psych holding rounding  58 year old female in this department for greater than 84 hours at this time.  Patient originally presented for diarrhea and behavioral issues, IVC completed by husband after patient was abusive to nursing facility staff.  Patient was medically cleared for psychiatric evaluation by previous team.  According to most recent social work note from 09/23/2018 appears that they are trying to place patient in a skilled nursing facility.  CIWA in place.  Most recent labs: COVID-19 negative Tylenol level 16 Salicylate level negative Ammonia 62 Beta-hCG negative Ethanol negative CBC with anemia of 8.6, appears stable from prior CMP nonacute UDS positive for benzodiazepines Urinalysis negative Chest x-ray:  IMPRESSION:  No acute abnormality. Stable mild chronic bronchitic changes.   EKG: Reviewed by Dr. Jodi Mourning without acute findings - I have been asked to evaluate this patient due to her picking at her arms, reportedly has a several small bleeding areas.  No other overnight events to report.  Most recent vital signs from 09/24/2018 at 7:19 AM: Temperature 97.9 F, pulse 83, blood pressure 101/54, respiratory rate 18, SPO2 96%.  Physical Exam  BP (!) (P) 101/54 (BP Location: Right Arm)   Pulse (P) 83   Temp (P) 97.9 F (36.6 C) (Oral)   Resp (P) 18   SpO2 (P) 96%   Physical Exam Constitutional:      General: She is not in acute distress.    Appearance: Normal appearance. She is well-developed. She is not ill-appearing or diaphoretic.  HENT:     Head: Normocephalic and atraumatic.     Right Ear: External ear normal.     Left Ear: External ear normal.     Nose: Nose normal.  Eyes:     General: Vision grossly intact. Gaze aligned appropriately.     Pupils: Pupils are equal, round, and reactive to light.  Neck:     Musculoskeletal: Normal range of motion.     Trachea: Trachea and phonation normal. No tracheal deviation.  Pulmonary:     Effort: Pulmonary  effort is normal. No respiratory distress.  Abdominal:     General: There is no distension.     Palpations: Abdomen is soft.     Tenderness: There is no abdominal tenderness. There is no guarding or rebound.  Musculoskeletal: Normal range of motion.  Skin:    General: Skin is warm and dry.     Comments: Several small excoriations, most significant to left forearm approximately 5 mm in diameter with scant bleeding.  No erythema, fluctuance, induration or purulent drainage, no streaking.  Neurological:     Mental Status: She is alert.     GCS: GCS eye subscore is 4. GCS verbal subscore is 5. GCS motor subscore is 6.     Comments: Speech is clear and goal oriented, follows commands Major Cranial nerves without deficit, no facial droop Moves extremities without ataxia, coordination intact  Psychiatric:        Behavior: Behavior normal.     ED Course/Procedures     Procedures  MDM  Patient with several small discolorations of her bilateral upper extremities, all appear very superficial, no signs of superimposed infection necessitating oral antibiotics.  Patient to be provided with bacitracin and wound care by nursing staff.  On evaluation patient is sleeping easily arousable to voice states she is feeling well.  She has eaten some of her breakfast, she reports that the English muffin that she had just eaten has given her somewhat of an upset stomach she  is requesting a GI cocktail reports this is worked well for her in the past for upset stomach.  Nonacute abdomen no indication for imaging or repeat blood work at this time.  At this time there does not appear to be any evidence of an acute emergency medical condition and the patient appears stable for continued pysch observation.  Note: Portions of this report may have been transcribed using voice recognition software. Every effort was made to ensure accuracy; however, inadvertent computerized transcription errors may still be present.    Deliah Boston, PA-C 09/24/18 [id number]    Gareth Morgan, MD 09/26/18 [phone]

## 2018-09-24 NOTE — ED Notes (Signed)
Pt in shower. Sitter encouraging pt to wash herself d/t pt states repeatedly "I'm done!" before she would attempt to wash herself.

## 2018-09-24 NOTE — ED Notes (Signed)
Patient ambulated to the bathroom.

## 2018-09-24 NOTE — ED Notes (Signed)
L Layden, PA, aware pt's Potassium 2.6.

## 2018-09-24 NOTE — ED Notes (Signed)
Pt wound cleaned and redressed with bacitracin ointment and sterile gauze.

## 2018-09-24 NOTE — ED Notes (Signed)
Pt c/o abd pain, Hemorid pain. States that no one has seen her for her pain. Told pt that she was seen by the PA today and labs done. Pt complains about everything she can to get anyone to stay in the room to listen to her.

## 2018-09-24 NOTE — ED Provider Notes (Signed)
ED psychiatric rounding note  Called to patient's room by nursing staff as patient complaining of abdominal pain.  Upon evaluation patient is actively eating lunch resting comfortably in no acute distress.  She reports a generalized abdominal pain aching sensation that has been intermittent for the past several days.  During palpation of abdomen patient is asking for refill of her Diet Coke.  Abdomen is soft she reports a generalized tenderness there is no rebound, guarding or focal tenderness.  Patient with obese abdomen does not feel necessarily distended, patient reports this fluctuates for her.  Will obtain screening lab work including CBC, CMP and lipase as these have not been repeated in the past 3 days.  Do not feel patient needs CT scan at this time with nonacute abdominal exam and she is actively eating and drinking here in the emergency department. - Labs currently pending care handoff given to Providence Lanius PA-C to follow laboratory values for acute changes.    Note: Portions of this report may have been transcribed using voice recognition software. Every effort was made to ensure accuracy; however, inadvertent computerized transcription errors may still be present.   Deliah Boston, PA-C 09/24/18 1621    Gareth Morgan, MD 09/26/18 1615

## 2018-09-24 NOTE — ED Provider Notes (Signed)
Assumed care from Nuala Alpha, PA-C with labs pending.  Patient has been complaining of some abdominal pain.  Patient with benign abdominal exam.  She was eating and drinking soda while exam was being done with no signs of distress.  Basic labs ordered for evaluation.  CBC shows no leukocytosis.  Hemoglobin stable at 9.1.  CMP shows normal BUN and creatinine.  Potassium is 2.6.  Lipase within normal limits.  Given hypokalemia, will plan for potassium repletion.  RN informed me that patient did not have an IV. Will give oral potassium.  Patient signed out to oncoming PA.   Portions of this note were generated with Lobbyist. Dictation errors may occur despite best attempts at proofreading.    Volanda Napoleon, PA-C 09/25/18 0004    Elnora Morrison, MD 09/25/18 [phone]

## 2018-09-24 NOTE — ED Notes (Signed)
Pt ambulatory w/o difficulty to bathroom and back to room w/walker.

## 2018-09-25 LAB — AMMONIA: Ammonia: 46 umol/L — ABNORMAL HIGH (ref 9–35)

## 2018-09-25 LAB — COMPREHENSIVE METABOLIC PANEL
ALT: 18 U/L (ref 0–44)
ALT: 17 U/L (ref 0–44)
AST: 39 U/L (ref 15–41)
AST: 37 U/L (ref 15–41)
Albumin: 2.1 g/dL — ABNORMAL LOW (ref 3.5–5.0)
Albumin: 2.3 g/dL — ABNORMAL LOW (ref 3.5–5.0)
Alkaline Phosphatase: 90 U/L (ref 38–126)
Alkaline Phosphatase: 93 U/L (ref 38–126)
Anion gap: 11 (ref 5–15)
Anion gap: 9 (ref 5–15)
BUN: <5 mg/dL — ABNORMAL LOW (ref 6–20)
BUN: <5 mg/dL — ABNORMAL LOW (ref 6–20)
CO2: 34 mmol/L — ABNORMAL HIGH (ref 22–32)
CO2: 38 mmol/L — ABNORMAL HIGH (ref 22–32)
Calcium: 7.9 mg/dL — ABNORMAL LOW (ref 8.9–10.3)
Calcium: 8.2 mg/dL — ABNORMAL LOW (ref 8.9–10.3)
Chloride: 92 mmol/L — ABNORMAL LOW (ref 98–111)
Chloride: 90 mmol/L — ABNORMAL LOW (ref 98–111)
Creatinine, Ser: 0.58 mg/dL (ref 0.44–1.00)
Creatinine, Ser: 0.57 mg/dL (ref 0.44–1.00)
GFR calc Af Amer: >60 mL/min (ref >60)
GFR calc Af Amer: >60 mL/min (ref >60)
GFR calc non Af Amer: >60 mL/min (ref >60)
GFR calc non Af Amer: >60 mL/min (ref >60)
Glucose, Bld: 113 mg/dL — ABNORMAL HIGH (ref 70–99)
Glucose, Bld: 113 mg/dL — ABNORMAL HIGH (ref 70–99)
Potassium: 2.6 mmol/L — CL (ref 3.5–5.1)
Potassium: 2.6 mmol/L — CL (ref 3.5–5.1)
Sodium: 137 mmol/L (ref 135–145)
Sodium: 137 mmol/L (ref 135–145)
Total Bilirubin: 1.6 mg/dL — ABNORMAL HIGH (ref 0.3–1.2)
Total Bilirubin: 1.8 mg/dL — ABNORMAL HIGH (ref 0.3–1.2)
Total Protein: 6 g/dL — ABNORMAL LOW (ref 6.5–8.1)
Total Protein: 6.2 g/dL — ABNORMAL LOW (ref 6.5–8.1)

## 2018-09-25 LAB — BASIC METABOLIC PANEL
Anion gap: 5 (ref 5–15)
BUN: <5 mg/dL — ABNORMAL LOW (ref 6–20)
CO2: 37 mmol/L — ABNORMAL HIGH (ref 22–32)
Calcium: 7.8 mg/dL — ABNORMAL LOW (ref 8.9–10.3)
Chloride: 95 mmol/L — ABNORMAL LOW (ref 98–111)
Creatinine, Ser: 0.62 mg/dL (ref 0.44–1.00)
GFR calc Af Amer: >60 mL/min (ref >60)
GFR calc non Af Amer: >60 mL/min (ref >60)
Glucose, Bld: 95 mg/dL (ref 70–99)
Potassium: 3.1 mmol/L — ABNORMAL LOW (ref 3.5–5.1)
Sodium: 137 mmol/L (ref 135–145)

## 2018-09-25 MED ORDER — POTASSIUM CHLORIDE CRYS ER 20 MEQ PO TBCR
20.0000 meq | EXTENDED_RELEASE_TABLET | Freq: Every day | ORAL | Status: DC
  Administered 2018-09-25 – 2018-09-26 (×2): 20 meq via ORAL
  Filled 2018-09-25: qty 1

## 2018-09-25 MED ORDER — MAGNESIUM CHLORIDE 64 MG PO TBEC
1.0000 | DELAYED_RELEASE_TABLET | Freq: Every day | ORAL | Status: DC
  Administered 2018-09-26 – 2018-09-29 (×4): 64 mg via ORAL
  Filled 2018-09-25 (×5): qty 1

## 2018-09-25 MED ORDER — LIDOCAINE HCL URETHRAL/MUCOSAL 2 % EX GEL: 1.0000 "application " | Freq: Once | CUTANEOUS | Status: DC

## 2018-09-25 MED ORDER — MAGNESIUM SULFATE 2 GM/50ML IV SOLN
2.0000 g | Freq: Once | INTRAVENOUS | Status: DC
  Filled 2018-09-25: qty 50

## 2018-09-25 MED ORDER — LIDOCAINE HCL URETHRAL/MUCOSAL 2 % EX GEL
1.0000 "application " | Freq: Two times a day (BID) | CUTANEOUS | Status: DC | PRN
  Administered 2018-09-25 – 2018-09-28 (×2): 1 via TOPICAL
  Filled 2018-09-25 (×3): qty 20

## 2018-09-25 MED ORDER — CALCIUM GLUCONATE-NACL 1-0.675 GM/50ML-% IV SOLN
1.0000 g | Freq: Once | INTRAVENOUS | Status: DC
  Filled 2018-09-25: qty 50

## 2018-09-25 MED ORDER — POTASSIUM CHLORIDE CRYS ER 20 MEQ PO TBCR
40.0000 meq | EXTENDED_RELEASE_TABLET | Freq: Once | ORAL | Status: AC
  Administered 2018-09-25: 40 meq via ORAL
  Filled 2018-09-25: qty 2

## 2018-09-25 MED ORDER — CALCIUM CARBONATE ANTACID 500 MG PO CHEW
400.0000 mg | CHEWABLE_TABLET | Freq: Every day | ORAL | Status: DC
  Administered 2018-09-26 – 2018-10-09 (×13): 400 mg via ORAL
  Filled 2018-09-25 (×13): qty 2

## 2018-09-25 NOTE — ED Notes (Addendum)
Pt to desk. Stating "My sitter was ignoring me all night long" "She wouldn't even speak to me". This RN told pt that Miss Anne Black had been speaking to her all night. Pt states that "she did not speak to me". Sitter changed pts bed and helped with her brief multiple times throughout the night. Sitter had been very nice to the patient.

## 2018-09-25 NOTE — Consult Note (Addendum)
Anne Black is a 58 year old female with pmh bipolar disorder, ADD, multiple sclerosis, paroxysmal atrial fibrillation, alcohol dependence, and alcoholic cirrhosis with esophageal varices requiring paracentesis in the past; who presented with complaints of diarrhea.  Patient noted to have an IVC papers due to abusiveness fall at [facility] facility.  She was evaluated by psychiatry and medically cleared.  Patient currently pending placement.  In reviewing her labs she has had a mildly elevated ammonia level of 62 on 8/13.  Consulted for altered mental status, but it appears patient near her baseline on evaluation.  Complaining of having a headache needing to see her neurologist at [facility] and diarrhea.  Patient has been anywhere from 2-4 bowel movements per day.  Despite the patient refusing lactulose her repeat ammonia level 46.  From a medical standpoint patient appears fairly stable and at her baseline.  Recommending continued monitoring of electrolytes and adjustment of daily supplementation.  Encourage lactulose to maintain 2-4 bowel movements per day.

## 2018-09-25 NOTE — ED Notes (Signed)
Reordered a breakfast tray for this pt.

## 2018-09-25 NOTE — ED Notes (Signed)
Pt up and to nurses station in only a brief and shirt. States that she wants pain medication and wants this RN to page her neurologist. Made pt aware that the RN does not do that here and that her care is by the ER MD.

## 2018-09-25 NOTE — ED Notes (Signed)
Pt yelling from room for this RN to come to her room. When entering, pt begins to take deep breaths and says "Wait a minute, I need to catch my breath" then proceeds to ask multiple questions that have already been answered. Pt requesting "hydrocodone" for her abd pain. MD aware. When looking in on pt throughout the night, pt is in no distress, and sleeping. When pt wakes up, she starts asking about pain medication again.

## 2018-09-25 NOTE — ED Provider Notes (Signed)
Blood pressure 117/66, pulse 73, temperature 97.6 F (36.4 C), temperature source Oral, resp. rate 18, SpO2 100 %.  In short, Anne Black is a 58 y.o. female with a chief complaint of Altered Mental Status .  Refer to the original H&P for additional details.  03:40 PM  Spoke with TRH Dr. Tamala Julian regarding elevated ammonia and AMS. Will repeat potassium and ammonia now. Patient is refusing her Lactulose here in the ED. TRH with consult. Appreciate assistance with case.   Spoke with Dr. Tamala Julian after his evaluation. No indication for admit at this time. Continue placement from the ED. Discussed the down-trending K. Plan for continued PO and has increased supplement dose. Ammonia down-trending from initial value.    Margette Fast, MD 09/25/18 [phone]

## 2018-09-25 NOTE — ED Notes (Signed)
Breakfast tray ordered 

## 2018-09-25 NOTE — ED Notes (Signed)
Pt was incontinent of both urine and stool while in bed pt has been ambulatory and continent all day prior to this and is choosing not to get up at this time. RN notified.

## 2018-09-25 NOTE — ED Notes (Signed)
Pt refused v/s,  Nurse aware.

## 2018-09-25 NOTE — ED Notes (Signed)
Patient walked to RN station to use the phone; patient currently on the phone with family/friend; pt continues to yelling at staff and demand staff to do things that she is able to do herself; pt ambulated back to room using walker-Monique,RN

## 2018-09-25 NOTE — Progress Notes (Addendum)
2pm: CSW spoke with patient's guardian Joneen Caraway to obtain information about patient's life prior to her arrival at [facility]. Joneen Caraway reports that the patient was living at home prior to coming to [name] ED. Joneen Caraway reports that she would often drink abundant amounts of alcohol and sit in feces all day without cleaning herself. Joneen Caraway reports that the patient has six pending criminal charges and CSW confirmed those pending charges on [place] [address] calendar database. CSW to speak with [facility] supervisor for further guidance on how to proceed.  11:40am: CSW spoke with Chantel at [facility] to obtain clarity as to why this patient left the facility. Chantel reports that the patient was escorted off the premises by [employer] due to her having an outstanding warrant for her arrest. Chantel states that this patient is banned from [place] for life.  10am: CSW received return call from Burnell Blanks. Joneen Caraway informed CSW that he is not the patient's spouse, that the two have been divorced for 20 years but he is the active legal guardian. Joneen Caraway and CSW discussed the barriers that have been encountered to place this patient in a facility including lack of insurance and documented negative behaviors. CSW explained to Joneen Caraway that this patient is appropriate for memory care instead of skilled nursing due to her lacking a skillable need. Patient is able to complete majority of her ADL's independently and CSW has witnessed her ability to ambulate. Joneen Caraway reports that there are no financial limitations for finding placement for this patient. Joneen Caraway reports that the patient has five daughters but none of them live nearby. Joneen Caraway reports that he is willing to prepay for memory care placement regardless of cost.   CSW agreed to pursue private pay route for memory care placement to determine if there is availability.  9:45am: CSW attempted to reach patient's husband Joneen Caraway without success - voicemail was left requesting a return call.  9am: CSW spoke with Kuristin at  [facility] to request a review of this patient. Kuristin reports that they cannot offer a bed to this patient due to her behaviors (verbally abusive) and history with staff at the facility. CSW spoke with [place] at [facility] to request a review of this patient. Daleen Snook states that due to the patient's behaviors, they cannot accept her.  At this time, patient has been declined at four facilities with zero acceptances.   Madilyn Fireman, MSW, LCSW-A Clinical Social Worker [facility] [phone]

## 2018-09-25 NOTE — ED Notes (Signed)
Pt up to restroom. Ambulates without difficulty. States "I'm wet". Linens changed and bed cleaned. Pt now has a brief on and new pants. Maalox given for "abd pain".

## 2018-09-25 NOTE — ED Notes (Addendum)
Pt is demanding bedpan to use for bathroom. Pt denied bedpan at this time as she is ambulatory. In response, pt removed brief and defecated in bed. Charge nurse at bedside.

## 2018-09-25 NOTE — ED Notes (Signed)
Patient stood up on the room and pooped all there the floor; pt was directed to the bathroom but patient sat in chair in the common areas and refused to get up; IV present to placed IV but patient refused IV start; Patient demanding her purse and was advised that her belongings are locked up and will remain until she leave facility; Pt is yelling at staff and had to be phyically picked up by 2 RNs to get back into room; Agricultural consultant notified-Monique,RN

## 2018-09-25 NOTE — ED Notes (Signed)
Pts dinner tray is at bedside.

## 2018-09-25 NOTE — ED Notes (Signed)
Patient continues to refuse to get up and walk; Pt demands sitter to change her brief; Pt is having small amounts of diarrhea

## 2018-09-25 NOTE — ED Notes (Signed)
Pt refused all medications and demands to use the phone to make a call; pt is told that the phone is off limits at this hour; 2nd RN attempted to talk to patient about care plan for her health; before leaving patient's room she agreed to take all PO meds-Monique,RN

## 2018-09-25 NOTE — ED Notes (Signed)
EDP has been made aware of pt's condition by [facility]; patient continues to have diarrhea at this time;and previous labs has been noted-Monique,RN

## 2018-09-25 NOTE — Progress Notes (Signed)
CSW updated on status of patient by reviewing chart. CSW informed by chart that pt is not allowed to return to [facility]. CSW aware that family is willing to pay privately for ALF/memory care unit. CSW sent out referrals to ALF via the hub.  [facility] [employer] Transitions of Care  Clinical Social Worker  Ph: [phone]

## 2018-09-25 NOTE — ED Notes (Signed)
Patient refused vitals from [facility] and Nurse Tech

## 2018-09-25 NOTE — ED Notes (Signed)
Pt insisting we call her neurologist at [facility]. Attempting to explain to pt that she is being followed by the doctors here and they will consult neurologists here if they feel the need, Dr Laverta Baltimore informed of pt request

## 2018-09-25 NOTE — ED Notes (Signed)
Pt c/o headache, stomache and pain at hemmrhoid site. Lidocaine applied to buttocks, pt has been given tylenol and ativan and reminded of next time she can have zofran. Pt previously wet bed and had total linen changed. Pt stated she needed to use bathroom pt instructed to ambulate to bathroom, where she did without apparent difficulty but stated she was "weak and unsteady."

## 2018-09-25 NOTE — ED Notes (Signed)
Patient is refusing to allow ED RN to continue to attempt to obtain IV; patient folded arms and put covers over her head in an attempt to keep from getting IV; IV team has been consulted for IV start; Agricultural consultant notifed-Monique,RN

## 2018-09-25 NOTE — Progress Notes (Signed)
Arrived on unit to patient out of room in hall stating over and over "No IVs".  Spoke with nurse helping bedside nurse regarding the ability to start IV safely with patient in current state.  It was decided that consult for IV start would be reentered once patient was more cooperative for IV start.  Carolee Rota, RN VAST

## 2018-09-25 NOTE — ED Provider Notes (Signed)
58 year old female here under IVC.  Patient originally presented with diarrhea and behavioral issues.  Patient was medically cleared from a psychiatric evaluation and is waiting skilled nursing placement.  Patient's labs today show improvement in her potassium at 3.1, she is not on daily potassium supplement I will have her initiate potassium 20 mg daily while here in the hospital.  Her ammonia is elevated at 62 today, looking back she is chronically elevated for the most part.  Attempts at oral lactulose have failed, given the fact that she is not acutely encephalopathic I do not feel it necessary to force this at the moment to treat the elevated number.  Encouraged nursing staff to continue trying oral lactulose.  Patient is resting comfortably in exam bed in no acute distress at the time my evaluation.  Vitals:   09/25/18 0757 09/25/18 1235  BP: 117/60 117/66  Pulse: 79 73  Resp: 19 18  Temp: 97.9 F (36.6 C) 97.6 F (36.4 C)  SpO2: 97% 100%      Okey Regal, PA-C 09/25/18 1541    Charlesetta Shanks, MD 09/27/18 [phone]

## 2018-09-25 NOTE — ED Notes (Signed)
Patient is at the RN station speaking with [employer] about being "protected" from staff placing an IV without her consent; Pt standing at RN station pooping on the floor; Patient attempted to wipe herself in common areas but was redirected by staff back to her room-Monique,RN

## 2018-09-25 NOTE — ED Notes (Signed)
Pt c/o stomachache, being cold, hungry, and needing to use the bathroom. Pt given zofran. Up to bathroom after much encouragement, wanted to stay in bed and be soil brief. Ambulated to bathroom without difficulty with use of walker after this RN insisted to her it would be more sanitary for her to use the bathroom instead of being changed in bed.

## 2018-09-26 LAB — BASIC METABOLIC PANEL
Anion gap: 6 (ref 5–15)
BUN: <5 mg/dL — ABNORMAL LOW (ref 6–20)
CO2: 38 mmol/L — ABNORMAL HIGH (ref 22–32)
Calcium: 8.2 mg/dL — ABNORMAL LOW (ref 8.9–10.3)
Chloride: 94 mmol/L — ABNORMAL LOW (ref 98–111)
Creatinine, Ser: 0.48 mg/dL (ref 0.44–1.00)
GFR calc Af Amer: >60 mL/min (ref >60)
GFR calc non Af Amer: >60 mL/min (ref >60)
Glucose, Bld: 87 mg/dL (ref 70–99)
Potassium: 2.9 mmol/L — ABNORMAL LOW (ref 3.5–5.1)
Sodium: 138 mmol/L (ref 135–145)

## 2018-09-26 LAB — C DIFFICILE QUICK SCREEN W PCR REFLEX
C Diff antigen: NEGATIVE
C Diff interpretation: NOT DETECTED
C Diff toxin: NEGATIVE

## 2018-09-26 LAB — MAGNESIUM: Magnesium: 1.4 mg/dL — ABNORMAL LOW (ref 1.7–2.4)

## 2018-09-26 MED ORDER — MAGNESIUM OXIDE 400 (241.3 MG) MG PO TABS
400.0000 mg | ORAL_TABLET | Freq: Once | ORAL | Status: AC
  Administered 2018-09-26: 400 mg via ORAL
  Filled 2018-09-26: qty 1

## 2018-09-26 MED ORDER — POTASSIUM CHLORIDE CRYS ER 20 MEQ PO TBCR
40.0000 meq | EXTENDED_RELEASE_TABLET | Freq: Once | ORAL | Status: AC
  Administered 2018-09-26: 40 meq via ORAL
  Filled 2018-09-26: qty 2

## 2018-09-26 NOTE — ED Notes (Signed)
Pt noted w/incont of stool multiple episodes. Pt continues to constantly yell "Hello!" "I need help!" When staff enter room, pt states "I have a diaper load full!" or "I need ice". Pt ate 50% of breakfast at this time. States "I am afraid to eat" but refuses for tray to be removed. Pt shown she has ice chips on bedside table. Pt is able to ambulate, however, refuses. Pt yelling "I want my diaper changed" every few minutes but then refuses to ambulate to bathroom. States "I'm too unstable". Pt then yells at staff when attempts to clean her "Get your hands off me!"

## 2018-09-26 NOTE — ED Notes (Signed)
Pt walked out of room - down hallway. Escorted back to room. States in loud voice she needs to get to emergency department where she can be taken care of. Pt is able to verbalize she is at [facility] but she needs to get to the "medical part". Encouraged pt to lower voice when talking.

## 2018-09-26 NOTE — ED Notes (Signed)
Pt noted to be continuously talking - argumentative w/staff.

## 2018-09-26 NOTE — ED Notes (Signed)
Pt given Diet Ginger Ale as requested.

## 2018-09-26 NOTE — ED Notes (Signed)
Patient refused Lactulose again and was given boundaries of no sodas or juices til breakfast; Pt may have ice water at needed throughout the night; pt voiced understanding of terms due to continued refusal of important lactulose; Pt was given the importance of not taking Lactulose due to dx; Pt states she does not care and will not take the medication,RN

## 2018-09-26 NOTE — Progress Notes (Addendum)
CSW made APS report to [facility] APS. Patient is well known to the APS system and staff. Patient's ex husband Delene Morais was granted guardianship of the patient on 09/01/2018.   Madilyn Fireman, MSW, LCSW-A Clinical Social Worker [facility] [place] [facility] [phone]

## 2018-09-26 NOTE — ED Notes (Signed)
Pt has been sleeping intermittently - continues to yell as soon as she wakes.

## 2018-09-26 NOTE — ED Notes (Signed)
Pt continues to yell intermittently. Staff encouraging pt to lower voice.

## 2018-09-26 NOTE — ED Notes (Signed)
Pt noted to be irritable - constantly yelling out "I need help!" "Hello!". Pt ambulated to hallway - stool noted to be on floor, on floor in room, and on bed. Stool noted to be brown - loose/soft. Pt w/multiple c/o's - abd pain, headache, nausea. Pt noted to be confused. Per report, pt refused IV and pt stating she is refusing for labs to be drawn. Pt's Ammonia level noted to be 46.

## 2018-09-26 NOTE — ED Notes (Signed)
Patient daughter Meriel Pica calling asking for patient to call back [phone]

## 2018-09-26 NOTE — ED Notes (Signed)
J Hedges, PA, assessed pt.

## 2018-09-26 NOTE — ED Notes (Signed)
Regular Diet was ordered for Lunch. 

## 2018-09-26 NOTE — ED Notes (Signed)
Pt wakes intermittently and talks loudly to staff - demanding.

## 2018-09-26 NOTE — ED Notes (Signed)
Pt asking multiple questions/requests of staff. Pt noted to be demanding.

## 2018-09-26 NOTE — ED Provider Notes (Signed)
58 year old female originally presented with altered mental status under IVC with history of alcohol abuse.  Please see previous providers note for full H&P.  Patient has had fluctuating laboratory abnormalities including hypokalemia and elevated ammonia here.  She intermittently is refusing to take her medications.  She has ongoing diarrhea.  Yesterday with the ongoing hypokalemia discussed case with [facility] for hospital admission, the recommendation would be continued p.o. potassium here in the ED.  Upon my assessment patient is resting in exam bed.  She is very argumentative but is in no acute distress.  I discussed the need for ongoing potassium supplementation for her.  It is my understanding the patient has come from the nursing home who refuses to accept her along with numerous other facilities within the region.  Also her husband refusing to assume care for her.  We will continue discussions with case management for placement, will continue medical management here.  Vitals:   09/25/18 1652 09/26/18 0711  BP: (!) 113/50 (!) 106/55  Pulse: 79 88  Resp: 19 16  Temp: 98.1 F (36.7 C) 98.2 F (36.8 C)  SpO2: 100% 96%      Okey Regal, PA-C 09/26/18 1001    Charlesetta Shanks, MD 09/27/18 [phone]

## 2018-09-26 NOTE — ED Notes (Signed)
Anne Black [phone] pts daughter wants mother to call her, wanting to wish her a happy birthday

## 2018-09-26 NOTE — ED Notes (Signed)
No Sitter available for pt Brewing technologist aware.

## 2018-09-26 NOTE — ED Notes (Signed)
J Hedges, PA, aware pt's Potassium 2.9.

## 2018-09-26 NOTE — Discharge Planning (Signed)
EDCM following for dispostion needs.

## 2018-09-26 NOTE — ED Notes (Addendum)
Pt noted w/constant stools - incont. Refusing to ambulate to bathroom. Pt refusing to assist w/cleaning self - stated "That's what you get paid to do!" "It;s your job!" to EMT after she had removed brief herself. Pt continuously yelling.

## 2018-09-26 NOTE — ED Notes (Signed)
Pt noted to be irritable and continues to ask for multiple items. Pt asking for meal tray - stating "I'm so hungry". When meal tray delivered, states she does not want to eat the food. Encouraging pt to eat.

## 2018-09-26 NOTE — ED Notes (Signed)
Patient provided cell phone to call family member back-Monique,RN

## 2018-09-26 NOTE — ED Notes (Addendum)
Pt has had multiple incont loose stools. Pt refuses to clean herself. Pt cleaned by staff x 2. Pt eating cake for lunch. Lunch tray at bedside table. Pt aware her mother and daughter called to tell her happy birthday.

## 2018-09-27 ENCOUNTER — Emergency Department (HOSPITAL_COMMUNITY): Payer: Self-pay

## 2018-09-27 LAB — BASIC METABOLIC PANEL
Anion gap: 7 (ref 5–15)
BUN: <5 mg/dL — ABNORMAL LOW (ref 6–20)
CO2: 36 mmol/L — ABNORMAL HIGH (ref 22–32)
Calcium: 8.5 mg/dL — ABNORMAL LOW (ref 8.9–10.3)
Chloride: 96 mmol/L — ABNORMAL LOW (ref 98–111)
Creatinine, Ser: 0.53 mg/dL (ref 0.44–1.00)
GFR calc Af Amer: >60 mL/min (ref >60)
GFR calc non Af Amer: >60 mL/min (ref >60)
Glucose, Bld: 106 mg/dL — ABNORMAL HIGH (ref 70–99)
Potassium: 3 mmol/L — ABNORMAL LOW (ref 3.5–5.1)
Sodium: 139 mmol/L (ref 135–145)

## 2018-09-27 LAB — CBC
HCT: 30.8 % — ABNORMAL LOW (ref 36.0–46.0)
Hemoglobin: 9.4 g/dL — ABNORMAL LOW (ref 12.0–15.0)
MCH: 28.7 pg (ref 26.0–34.0)
MCHC: 30.5 g/dL (ref 30.0–36.0)
MCV: 94.2 fL (ref 80.0–100.0)
Platelets: 175 10*3/uL (ref 150–400)
RBC: 3.27 MIL/uL — ABNORMAL LOW (ref 3.87–5.11)
RDW: 23.1 % — ABNORMAL HIGH (ref 11.5–15.5)
WBC: 3.8 10*3/uL — ABNORMAL LOW (ref 4.0–10.5)
nRBC: 0 % (ref 0.0–0.2)

## 2018-09-27 LAB — GASTROINTESTINAL PANEL BY PCR, STOOL (REPLACES STOOL CULTURE)

## 2018-09-27 LAB — MAGNESIUM: Magnesium: 1.5 mg/dL — ABNORMAL LOW (ref 1.7–2.4)

## 2018-09-27 LAB — AMMONIA: Ammonia: 69 umol/L — ABNORMAL HIGH (ref 9–35)

## 2018-09-27 MED ORDER — ALBUTEROL SULFATE HFA 108 (90 BASE) MCG/ACT IN AERS
2.0000 | INHALATION_SPRAY | RESPIRATORY_TRACT | Status: DC
  Administered 2018-09-27 – 2018-10-01 (×15): 2 via RESPIRATORY_TRACT
  Filled 2018-09-27 (×3): qty 6.7

## 2018-09-27 MED ORDER — LORAZEPAM 2 MG/ML IJ SOLN
1.0000 mg | Freq: Once | INTRAMUSCULAR | Status: AC
  Administered 2018-09-27: 18:00:00 1 mg via INTRAMUSCULAR

## 2018-09-27 MED ORDER — POTASSIUM CHLORIDE CRYS ER 20 MEQ PO TBCR
40.0000 meq | EXTENDED_RELEASE_TABLET | Freq: Every day | ORAL | Status: DC
  Administered 2018-09-27 – 2018-10-09 (×13): 40 meq via ORAL
  Filled 2018-09-27 (×13): qty 2

## 2018-09-27 MED ORDER — LORAZEPAM 2 MG/ML IJ SOLN
INTRAMUSCULAR | Status: AC
  Filled 2018-09-27: qty 1

## 2018-09-27 MED ORDER — IPRATROPIUM BROMIDE HFA 17 MCG/ACT IN AERS
2.0000 | INHALATION_SPRAY | Freq: Four times a day (QID) | RESPIRATORY_TRACT | Status: DC
  Administered 2018-09-27 – 2018-10-01 (×13): 2 via RESPIRATORY_TRACT
  Filled 2018-09-27: qty 12.9

## 2018-09-27 MED ORDER — LORAZEPAM 2 MG/ML IJ SOLN
1.0000 mg | INTRAMUSCULAR | Status: DC | PRN
  Administered 2018-09-28 – 2018-09-29 (×6): 1 mg via INTRAMUSCULAR
  Filled 2018-09-27 (×6): qty 1

## 2018-09-27 MED ORDER — LORAZEPAM 2 MG/ML IJ SOLN: 1.0000 mg | Freq: Once | INTRAMUSCULAR | Status: DC

## 2018-09-27 MED ORDER — LORAZEPAM 1 MG PO TABS
1.0000 mg | ORAL_TABLET | Freq: Once | ORAL | Status: AC
  Administered 2018-09-27: 1 mg via ORAL
  Filled 2018-09-27: qty 1

## 2018-09-27 NOTE — Progress Notes (Addendum)
3:30pm: CSW completed extensive referral packet and application for patient for possible admission to [facility] in [place]. CSW sent over 63 pages of notes including the [facility], MD notes, RN notes, recent labs, recent psychiatric evaluation, patient's H&P, and current medication list. [place] faxed information to [phone].  10am: CSW received return call from [employer], intake social worker at [facility] regarding recent report that was made on this patient. Justice states that the report was not accepted due to the patient being at the hospital and not being in any immediate danger due to her current location. Justice provided CSW with possible options for placement for neurocognitive treatment at [facility] in [place], [place] and [facility] in [place], [place].  [facility] spoke with patient's guardian Dion Parrow to seek permission to send the patient's information to the two facilities listed above. Joneen Caraway stated agreement to have the information sent over to the facilities for review.   CSW spoke with Lamm Bond in admissions at [facility] to request a review of this patient. Mr. Edilia Bo states that the facility is accepting new patients at this time but he did emphasize the amount of time this process will take due to the patient being private pay. CSW will prepare application packet and fax it to admissions at [phone].  Madilyn Fireman, MSW, LCSW-A Clinical Social Worker Transitions of [place] [facility] [phone]

## 2018-09-27 NOTE — TOC Transition Note (Signed)
Transition of Care [facility]) - CM/SW Discharge Note   Patient Details  Name: Anne Black MRN: [id number] Date of Birth: 05-Aug-1960  Transition [facility]) CM/SW Contact:  Fuller Mandril, RN Phone Number: 09/27/2018, 12:07 PM   Clinical Narrative:    [facility] spoke with pt at bedside regarding disposition needs. Pt states she is 'up in the air' because she doesn't know what is going to happen to her house.  Pt continued to talk about her kids and family and how she misses them.     Barriers to Discharge: Insurance Authorization   Patient Goals and CMS Choice Patient states their goals for this hospitalization and ongoing recovery are:: "I don't want to be in the hospital." CMS Medicare.gov Compare Post Acute Care list provided to:: Legal Guardian    Discharge Placement     [facility] to follow for disposition needs.                   Discharge Plan and Services                                     Social Determinants of Health (SDOH) Interventions     Readmission Risk Interventions No flowsheet data found.

## 2018-09-27 NOTE — ED Notes (Signed)
Barrier cream applied

## 2018-09-27 NOTE — ED Notes (Signed)
Pt up to ambulate to BR states having diarrhea unsteady on her feet ,had taken off nonskid socks refusing lab draws and xray

## 2018-09-27 NOTE — ED Notes (Addendum)
Pt continues to call out stating she needs to call [facility] so they can give me orders for her feet, told her I could not take orders from her dr at [facility],  then states I need to call her pharmacy ,states she  Needs  more gabapentin, then she states she needs meds for her feet , has her O2 on states she is nauseated , I had already given her zofran, states she cannot breath well , had given her her breathing meds.

## 2018-09-27 NOTE — ED Notes (Signed)
Pt has threatened to poop in the bed or that she needed bedpan , we got pt up each time and she went BR in the BR

## 2018-09-27 NOTE — Progress Notes (Signed)
Late entry for 09/26/18 CSW received call on yesterday from [place] at [facility] APs and she reports that the intake for the pt has been denied because she is safe in the care of the hospital. Justice provided CSW with 2 locations to submit referrals to. 1) [place] 2)  [place].  CSW in contact with [facility]memory care in [facility]. [place]: [phone]. CSW was informed by Admission Director Mortimer Fries that they were not accepting pts at the moment due to a COVID outbreak within the facility.   Referral Packet submitted by 1st shift CSW to [facility].   TOC team will continue to follow pt for discharge needs.

## 2018-09-27 NOTE — ED Notes (Signed)
Pt is upset because she is IVC'D and doesn't understand why. Explained to Pt that the Dr. Did it this morning and that we would have someone come and talk to her about it. Pt expressed that she wants to go home and that she has a house to go to. Pt keeps stating "Why doesn't she like me? I haven't done anything wrong" Explained to pt that we aren't sure and that we would ask the Dr. To come and talk to her. Pt has been walking back and forth to the bathroom to have a bowel movement up until now when she stood up and pooped in the floor. Pt taken to the bathroom and cleaned up. Pt insisting on using the phone and keeps asking about when she is supposed to go to court. Dr. Gilford Raid was sent a message asking for something to help the patient calm down and asked to please come and talk to patient current situation.

## 2018-09-27 NOTE — ED Notes (Signed)
IVC papers were to expire today dr Clent Jacks renewed,   noterized and faxed to magistrate, [employer] notified that need to serve

## 2018-09-27 NOTE — ED Provider Notes (Signed)
Rounding check and renewal of IVC.  Patient is awaiting placement.  There is been significant difficulty in placement due to multiple facilities refusing to accept the patient and family members refusal to take the patient home.  Patient has significant behavioral disorder and at times is refusing medications, with potential significant compromise to her medical care and condition.  Intermittently patient is abusive and aggressive.  She was feeling to care for herself at home and unable to be cared for by staff at [facility] facility due to noncompliance and behavioral disorder.  Patient is ambulatory.  She did ambulate to the bathroom off of oxygen.  She felt short of breath upon returning.  Patient was placed back on her oxygen.  Her mental status is alert and oriented.  Mild increased work of breathing after ambulating off O2 but became in full sentences without difficulty.  Lung sounds are diffusely diminished.  Heart regular no gross rub murmur gallop.  Abdomen is protuberant but nontender.  Lower extremities have 1-2+ pitting edema.  Skin warm and dry.  Patient does not show signs of being acutely encephalopathic.  Nursing staff continues to have difficulty with patient taking medications as prescribed.  Will recheck basic chemistry, CBC, ammonia and chest x-ray to make sure these are stable.  Patient also has history of COPD.  Will need to add albuterol and ipratropium for baseline control of COPD.  Plan is to continue medical management and reassess these findings to determine if there is any need to change in patient's medical care.  Social work to continue attempting placement for the patient.  IVC renewed as patient is not competent for self-care and reportedly family is not able to care for the patient in her home.   Charlesetta Shanks, MD 09/27/18 [phone]

## 2018-09-27 NOTE — ED Notes (Signed)
Finally let tech draw labs but did not get her xray c/o rectal pain and itching, pt given cream for that

## 2018-09-27 NOTE — ED Notes (Signed)
Pt incontinent of stool on floor , pt very apologetic, pt cleaned and room cleaned , clothes changed

## 2018-09-27 NOTE — ED Notes (Signed)
Pt took one sip of lactolose and then refused rest  States she is going to die of diarrhea

## 2018-09-28 LAB — CBC WITH DIFFERENTIAL/PLATELET
Abs Immature Granulocytes: 0.01 10*3/uL (ref 0.00–0.07)
Basophils Absolute: 0 10*3/uL (ref 0.0–0.1)
Basophils Relative: 1 %
Eosinophils Absolute: 0 10*3/uL (ref 0.0–0.5)
Eosinophils Relative: 1 %
HCT: 29.2 % — ABNORMAL LOW (ref 36.0–46.0)
Hemoglobin: 8.9 g/dL — ABNORMAL LOW (ref 12.0–15.0)
Immature Granulocytes: 0 %
Lymphocytes Relative: 19 %
Lymphs Abs: 0.6 10*3/uL — ABNORMAL LOW (ref 0.7–4.0)
MCH: 29.1 pg (ref 26.0–34.0)
MCHC: 30.5 g/dL (ref 30.0–36.0)
MCV: 95.4 fL (ref 80.0–100.0)
Monocytes Absolute: 0.4 10*3/uL (ref 0.1–1.0)
Monocytes Relative: 12 %
Neutro Abs: 2.1 10*3/uL (ref 1.7–7.7)
Neutrophils Relative %: 67 %
Platelets: 156 10*3/uL (ref 150–400)
RBC: 3.06 MIL/uL — ABNORMAL LOW (ref 3.87–5.11)
RDW: 23 % — ABNORMAL HIGH (ref 11.5–15.5)
WBC: 3.1 10*3/uL — ABNORMAL LOW (ref 4.0–10.5)
nRBC: 0 % (ref 0.0–0.2)

## 2018-09-28 LAB — BASIC METABOLIC PANEL
Anion gap: 5 (ref 5–15)
BUN: <5 mg/dL — ABNORMAL LOW (ref 6–20)
CO2: 35 mmol/L — ABNORMAL HIGH (ref 22–32)
Calcium: 8.3 mg/dL — ABNORMAL LOW (ref 8.9–10.3)
Chloride: 99 mmol/L (ref 98–111)
Creatinine, Ser: 0.48 mg/dL (ref 0.44–1.00)
GFR calc Af Amer: >60 mL/min (ref >60)
GFR calc non Af Amer: >60 mL/min (ref >60)
Glucose, Bld: 104 mg/dL — ABNORMAL HIGH (ref 70–99)
Potassium: 3.2 mmol/L — ABNORMAL LOW (ref 3.5–5.1)
Sodium: 139 mmol/L (ref 135–145)

## 2018-09-28 LAB — AMMONIA: Ammonia: 79 umol/L — ABNORMAL HIGH (ref 9–35)

## 2018-09-28 MED ORDER — HYDROCODONE-ACETAMINOPHEN 5-325 MG PO TABS
1.0000 | ORAL_TABLET | Freq: Once | ORAL | Status: AC
  Administered 2018-09-28: 1 via ORAL
  Filled 2018-09-28: qty 1

## 2018-09-28 MED ORDER — NYSTATIN 100000 UNIT/GM EX POWD
Freq: Two times a day (BID) | CUTANEOUS | Status: DC | PRN
  Administered 2018-10-01 – 2018-10-02 (×2): via TOPICAL
  Filled 2018-09-28: qty 15

## 2018-09-28 MED ORDER — GERHARDT'S BUTT CREAM
TOPICAL_CREAM | Freq: Two times a day (BID) | CUTANEOUS | Status: DC
  Administered 2018-09-28: 1 via TOPICAL
  Administered 2018-09-29 – 2018-10-03 (×8): via TOPICAL
  Filled 2018-09-28: qty 1

## 2018-09-28 MED ORDER — ZINC OXIDE 40 % EX OINT
TOPICAL_OINTMENT | Freq: Two times a day (BID) | CUTANEOUS | Status: DC
  Filled 2018-09-28: qty 57

## 2018-09-28 MED ORDER — LORAZEPAM 2 MG/ML IJ SOLN
1.0000 mg | Freq: Once | INTRAMUSCULAR | Status: AC
  Administered 2018-09-28: 1 mg via INTRAMUSCULAR
  Filled 2018-09-28: qty 1

## 2018-09-28 MED ORDER — GERHARDT'S BUTT CREAM
TOPICAL_CREAM | Freq: Two times a day (BID) | CUTANEOUS | Status: DC
  Filled 2018-09-28: qty 1

## 2018-09-28 MED ORDER — ZINC OXIDE 40 % EX OINT
TOPICAL_OINTMENT | Freq: Two times a day (BID) | CUTANEOUS | Status: DC
  Administered 2018-09-28: 1 via TOPICAL
  Administered 2018-09-29 – 2018-09-30 (×4): via TOPICAL
  Administered 2018-10-01: 1 via TOPICAL
  Administered 2018-10-01 – 2018-10-03 (×3): via TOPICAL
  Filled 2018-09-28 (×2): qty 57

## 2018-09-28 NOTE — ED Provider Notes (Addendum)
  Rounded on patient this morning.    Patient sitting in bed.  She did have an episode of diarrhea in bed.  Patient refusing her albuterol inhalers morning.  On evaluation, she has no evidence of wheezing.  No evidence of respiratory distress.  Vitals otherwise stable.  CBC shows leukopenia of 3.1.  Hemoglobin is 8.9.  Ammonia is 79.  She has a history of elevated ammonia.  This is slightly higher than it was previously.  She is on lactulose.  At this time, she is not acutely encephalopathic.  She is irritable and wanting to go home but she is able to be redirected and answer questions.  BMP shows potassium of 3.2.  RN inform me that patient was more agitated.  I went to evaluate patient and she was resting comfortably in bed.  Indication for medications at this time.  RN again asked me to evaluate patient given concerns of agitation.  On my evaluation, she is sitting comfortably in bed.  She states that she wants to leave but is otherwise in no danger and is not being aggressive towards staff.  Patient states that she is concerned about diarrhea and skin irritation.  I examined her buttock which did show some mild skin irritation but no evidence of skin breakdown/ulceration.  Barrier cream has been applied.  At this time, patient is sitting comfortably on bed.  I do not feel that sedative medication is needed at this time.  Portions of this note were generated with Lobbyist. Dictation errors may occur despite best attempts at proofreading.     Volanda Napoleon, PA-C 09/28/18 1537    Volanda Napoleon, PA-C 09/28/18 1555    Elnora Morrison, MD 09/28/18 [phone]

## 2018-09-28 NOTE — ED Notes (Signed)
Refused vital signs. Still refusing to wear oxygen but appears to be in no acute respiratory distress

## 2018-09-28 NOTE — ED Notes (Signed)
Pt demanding to make phone call to [facility], was given the phone to call while being cleaned up but did not push button to put call through. At this time is more agitated and aggressive with staff and therefore is not permitted to make a phone call at this time. Also insisting on not wearing breif/refusing to allow this RN to secure brief as "oxygen helps the skin heal" Walked out of room with breif hanging around ankles demanding to call [employer] and to speak with the police officer. Then defecated on the floor. Cleaned up in the bathroom after much encouragement from sitter and LEO to go to bathroom to be cleaned up, ambulated without difficulty. In bed now. Pt does not like this RN at this time, saying "get lost, don't touch me" as I was securing her oxygen.

## 2018-09-28 NOTE — ED Notes (Signed)
While being cleaned refused to roll back over dirty linens until new orders for medications have arrived

## 2018-09-28 NOTE — ED Notes (Signed)
Breakfast Tray Ordered. 

## 2018-09-28 NOTE — ED Notes (Signed)
Pt now stating that she is constipated. After having diarrhea throughout the day, is requesting something to make her go to the restroom.

## 2018-09-28 NOTE — ED Notes (Signed)
Pt continuing to demand to go to the hospital "to get the care I need." Telling all present staff that they are fired, demanding to speak to [employer]'s boss after being escorted back to her room following her yelling "I haven't been served, you are lying," at this RN when I informed her she was involuntarily committed and therefore could not choose to leave.

## 2018-09-28 NOTE — ED Notes (Signed)
Pt cried out "I had an explosion" (of bowel movement) approximately five minutes after sitter asked if she had to go to the bathroom. Pt changed and ambulated without difficulty to the bathroom with sitter.

## 2018-09-28 NOTE — ED Notes (Addendum)
Pt is now lying in bed refusing to put oxygen on. Wants to go to the "wound unit" and wants a rectal tube.

## 2018-09-28 NOTE — ED Notes (Signed)
Pt talking and mumbling in sleep. Patient had incontinent episode of urine and stool. Pads changed.

## 2018-09-28 NOTE — ED Notes (Signed)
Patient continues to yelling at staff while attempting to clean her; 3 staff members assisting to clean patient and place a new brief on; pt refusing to ambulate to the bathroom stating "It's your job to clean me up!"-Monique,RN

## 2018-09-28 NOTE — ED Notes (Signed)
Cleaned pt of stool and urine again, pt became agitated at sitter yelling "don't touch me!" beleiving sitter was trying to wipe "the tender spots" (sitter was attempting to clean the pt of stool). Does not want brief, purpose of brief explained to pt. Pt is demanding to be taken "to the hospital" explained to pt she is already in the hospital.

## 2018-09-28 NOTE — ED Notes (Addendum)
Refusing to allow sitter to clean her up until after she "calls the wound team." Will continue to monitor. ADDENDUM: when sitter attempted to clean pt again, pt was demanding sitter use a sheet to clean her instead of a wash cloth or tissue. When this was denied by this RN, pt demanded sitter and RN leave room halfway through being cleaned up. Now demanding that the sitter "leave the facility."

## 2018-09-28 NOTE — ED Notes (Signed)
Pt yelling from in room. Pt asked to stop yelling and that she is disturbing other patients.

## 2018-09-28 NOTE — Progress Notes (Addendum)
2:40pm: CSW received phone call from [place] at [facility] stating that this patient could not be accepted any of the facilities at this time due to behaviors.   2pm: CSW attempted to reach admissions again, additional voicemail left.  8am: CSW attempted to reach admissions at [facility] without success, left voicemail requesting a return call.

## 2018-09-28 NOTE — ED Notes (Addendum)
Patient refused to clean her self up; pt was provided with material to get poop off her and Siter was attempting to assit; pt was cleaned by 3 staff member and cream placed on bottom; pt also provided with ice pack by request; Pt walked out in hallway and stood there while pooping sayihng "I can't help it"-Monqiue,RN

## 2018-09-28 NOTE — ED Notes (Signed)
Uncooperative with vital signs but this RN managed to get Spo2 on room air.

## 2018-09-28 NOTE — ED Notes (Signed)
Pt refusing to take inhalers, insisting that she has already taken them, refuses to believe she has not. PA aware, listened to patient's lungs, states that the inhaler is not an immediate need at this time.

## 2018-09-29 LAB — MAGNESIUM: Magnesium: 1.5 mg/dL — ABNORMAL LOW (ref 1.7–2.4)

## 2018-09-29 LAB — BASIC METABOLIC PANEL
Anion gap: 5 (ref 5–15)
BUN: <5 mg/dL — ABNORMAL LOW (ref 6–20)
CO2: 34 mmol/L — ABNORMAL HIGH (ref 22–32)
Calcium: 7.9 mg/dL — ABNORMAL LOW (ref 8.9–10.3)
Chloride: 98 mmol/L (ref 98–111)
Creatinine, Ser: 0.43 mg/dL — ABNORMAL LOW (ref 0.44–1.00)
GFR calc Af Amer: >60 mL/min (ref >60)
GFR calc non Af Amer: >60 mL/min (ref >60)
Glucose, Bld: 89 mg/dL (ref 70–99)
Potassium: 3.4 mmol/L — ABNORMAL LOW (ref 3.5–5.1)
Sodium: 137 mmol/L (ref 135–145)

## 2018-09-29 LAB — AMMONIA: Ammonia: 49 umol/L — ABNORMAL HIGH (ref 9–35)

## 2018-09-29 MED ORDER — MAGNESIUM CHLORIDE 64 MG PO TBEC
2.0000 | DELAYED_RELEASE_TABLET | Freq: Every day | ORAL | Status: DC
  Administered 2018-09-30 – 2018-10-09 (×8): 128 mg via ORAL
  Filled 2018-09-29 (×12): qty 2

## 2018-09-29 MED ORDER — POTASSIUM CHLORIDE CRYS ER 20 MEQ PO TBCR
40.0000 meq | EXTENDED_RELEASE_TABLET | Freq: Once | ORAL | Status: AC
  Administered 2018-09-29: 19:00:00 40 meq via ORAL
  Filled 2018-09-29: qty 2

## 2018-09-29 MED ORDER — LIDOCAINE VISCOUS HCL 2 % MT SOLN: 15.0000 mL | Freq: Once | OROMUCOSAL | Status: DC

## 2018-09-29 MED ORDER — LORAZEPAM 1 MG PO TABS
1.0000 mg | ORAL_TABLET | ORAL | Status: AC | PRN
  Administered 2018-09-30: 1 mg via ORAL
  Filled 2018-09-29: qty 1

## 2018-09-29 MED ORDER — STERILE WATER FOR INJECTION IJ SOLN
INTRAMUSCULAR | Status: AC
  Filled 2018-09-29: qty 10

## 2018-09-29 MED ORDER — ALUM & MAG HYDROXIDE-SIMETH 200-200-20 MG/5ML PO SUSP: 30.0000 mL | Freq: Once | ORAL | Status: DC

## 2018-09-29 MED ORDER — OLANZAPINE 5 MG PO TBDP
5.0000 mg | ORAL_TABLET | Freq: Three times a day (TID) | ORAL | Status: DC | PRN
  Administered 2018-09-30 – 2018-10-08 (×16): 5 mg via ORAL
  Filled 2018-09-29 (×16): qty 1

## 2018-09-29 MED ORDER — ZIPRASIDONE MESYLATE 20 MG IM SOLR
10.0000 mg | Freq: Once | INTRAMUSCULAR | Status: AC
  Administered 2018-09-29: 10 mg via INTRAMUSCULAR
  Filled 2018-09-29: qty 20

## 2018-09-29 MED ORDER — OLANZAPINE 10 MG IM SOLR
10.0000 mg | Freq: Two times a day (BID) | INTRAMUSCULAR | Status: DC | PRN
  Administered 2018-09-29 – 2018-10-09 (×3): 10 mg via INTRAMUSCULAR
  Filled 2018-09-29 (×4): qty 10

## 2018-09-29 MED ORDER — ZIPRASIDONE MESYLATE 20 MG IM SOLR: 20.0000 mg | Freq: Two times a day (BID) | INTRAMUSCULAR | Status: DC | PRN

## 2018-09-29 NOTE — ED Notes (Signed)
Pt cleaned of urine and changed, VS taken. Pt arousable but immediately goes back to sleep. Will offer PO meds when more alert.

## 2018-09-29 NOTE — ED Notes (Signed)
Pt is resting comfortably at this time, respirations noted.

## 2018-09-29 NOTE — ED Notes (Addendum)
Consulted with Dr Coralyn Pear concerning pt's current EKG and BP. Safe to give Geodon at this time but will continue to monitor.  Pt cooperative with injection, follow-up EKG to be obtained.

## 2018-09-29 NOTE — ED Notes (Signed)
Breakfast Tray ordered  

## 2018-09-29 NOTE — ED Notes (Signed)
Pt called sitter "a big fat waste" and then stated she wanted ginger ale, insisting she had nothing to drink (pt had three various drinks on tray, including a ginger ale in a cup). Pt's request for ginger ale denied at this time.

## 2018-09-29 NOTE — ED Notes (Addendum)
Pt defecating in bed and continuously yelling at staff attempting to clean her. Immdiately after being cleaned up pt demanded to be cleaned again after stooling  and then got up without difficulty and removed diaper.

## 2018-09-29 NOTE — ED Notes (Addendum)
Patient allowed to use a phone to call her family . Stool incontinence care provided by nursing tech/sitter , complete bed linen change provided . Assisted back to bed by nursing tech/sitter .

## 2018-09-29 NOTE — Progress Notes (Addendum)
2pm: CSW spoke with Tammy at [facility] [phone]) in [place], [place] who stated the facility is not accepting new patients right now due to no bed availability. Tammy states the facility is holding beds for quarantining returning patients.   CSW spoke with Rip Harbour, admissions director at [facility] in [place] who stated the facility is not accepting new patients until September 1st, 2020.  CSW received fax from Mr. Edilia Bo at [facility] in [place] stating this patient does not meet criteria for admission.  10am: CSW spoke with Lowella Petties, RN regarding this patient. CSW to continue following to assist with a safe discharge plan.  Madilyn Fireman, MSW, LCSW-A Clinical Social Worker [facility] [phone]

## 2018-09-29 NOTE — ED Notes (Signed)
Patient states she is feeling anxious and request medication; PRN given-Monique,RN

## 2018-09-29 NOTE — Discharge Planning (Signed)
Clinical Social Work is seeking post-discharge placement for this patient at the following level of care: SNF.    

## 2018-09-29 NOTE — ED Notes (Addendum)
Patient got out of bed and stood bedside it and had a BM. On the floor. Patient gown was changed and bed pads was changed also. The floor was cleaned with bleach whips.

## 2018-09-30 MED ORDER — STERILE WATER FOR INJECTION IJ SOLN
INTRAMUSCULAR | Status: AC
  Administered 2018-09-30: 10 mL
  Filled 2018-09-30: qty 10

## 2018-09-30 NOTE — Progress Notes (Signed)
RT attempt to give pt her daily morning inhalers. Pt refused at this time. RT will attempt later.

## 2018-09-30 NOTE — ED Notes (Signed)
Pt's mother, Lavonia Dana, called and advised pt left her a message to retrieve her clothing and Coke's at nursing facility. Advised she got them the next day after pt left.

## 2018-09-30 NOTE — ED Notes (Signed)
Barrier cream and nyostatin powder applied to patient buttock area.

## 2018-09-30 NOTE — Progress Notes (Signed)
Pt refused medication this AM. RN notified.

## 2018-09-30 NOTE — Progress Notes (Signed)
CSW reviewing patient chart. CSW noted weekday CSW has consulted with [facility] supervisors. CSW reached out to review with them the course of action. CSW notes barriers that ALF's will not accept patient due to behaviors, SNF's have denied patient, and long leaf application has been submitted which typically takes some time as that facility generally has a waiting list.   Lamonte Richer, LCSW, [place] Worker II [phone]

## 2018-09-30 NOTE — ED Notes (Signed)
Pt on phone at nurses' desk leaving message asking for the person to get the rest of her belongings from nursing home.

## 2018-09-30 NOTE — ED Notes (Signed)
Pt initially refused Lactulose. Pt then stated "Here goes the diarrhea again". Encouraged pt to take Lactulose - pt agreed.

## 2018-09-30 NOTE — ED Notes (Signed)
Dinner ordered 

## 2018-09-30 NOTE — Progress Notes (Signed)
CSW reached on-call [place] and staffed patient's case. CSW noted they would be open to all possible discharge options for the patient while still pursuing neuro-med and other SNF level facilities.  CSW spoke with legal guardian regarding patient returning to her residence with custodial care and home health. CSW noted per guardian report the cleaning crew they hired is unable to clean her home to Monday and that they are worried if she returns home she will return to heavy alcohol use, erratic behavior, and be aggressive towards home care staff. CSW discussed looking at a few other intermediary options as Neuro-med referrals typically take some time and noted family was open but wanted to look at other options. CSW noted there were few options left and noted guardian stated they did not care for financial cost or area where patient is placed. CSW informed group home could possibly be an option but that they would likely require group home high funding which would need to be pursued in the community. CSW noted a possible group home in [place] that takes self-pay for group home high. CSW additionally discussed two assisted living facilities that have taking individuals with mental health and behavioral issues in the past [facility] in [place]) and received verbal consent to pursue all three options.  CSW researched and noted the group home in [place] had closed down two months ago. CSW will attempt to follow up with assisted living facilities.  Lamonte Richer, LCSW, [place] Worker II [phone]

## 2018-09-30 NOTE — ED Notes (Signed)
Ordered bfast 

## 2018-10-01 MED ORDER — IPRATROPIUM BROMIDE 0.02 % IN SOLN: 0.5000 mg | RESPIRATORY_TRACT | Status: DC | PRN

## 2018-10-01 MED ORDER — ALBUTEROL SULFATE HFA 108 (90 BASE) MCG/ACT IN AERS: 2.0000 | INHALATION_SPRAY | RESPIRATORY_TRACT | Status: DC | PRN

## 2018-10-01 MED ORDER — ALBUTEROL SULFATE (2.5 MG/3ML) 0.083% IN NEBU: 2.5000 mg | INHALATION_SOLUTION | RESPIRATORY_TRACT | Status: DC | PRN

## 2018-10-01 MED ORDER — IPRATROPIUM BROMIDE HFA 17 MCG/ACT IN AERS: 2.0000 | INHALATION_SPRAY | RESPIRATORY_TRACT | Status: DC | PRN

## 2018-10-01 NOTE — ED Notes (Signed)
Dinner tray ordered.

## 2018-10-01 NOTE — ED Notes (Signed)
Joey, SW, spoke w/pt at length. Pt in agreement to take shower then stated she will wait until later.

## 2018-10-01 NOTE — Progress Notes (Signed)
While giving pt her AM meds. Pt stated that she does not want to continue to take the medications. RN made aware. Will check back with pt at later time.

## 2018-10-01 NOTE — ED Provider Notes (Signed)
Emergency Medicine Observation Re-evaluation Note  Anne Black is a 58 y.o. female, seen on rounds today.  Pt initially presented to the ED for complaints of Altered Mental Status Currently, the patient is resting comfortably in bed.  Physical Exam  BP (!) 114/58 (BP Location: Left Arm)   Pulse 80   Temp 98.1 F (36.7 C) (Oral)   Resp 18   SpO2 97%  Physical Exam Vitals signs and nursing note reviewed.  Constitutional:      General: She is not in acute distress.    Appearance: She is well-developed. She is not ill-appearing or diaphoretic.     Comments: Resting comfortably in bed, pt alert but calm  HENT:     Head: Normocephalic and atraumatic.  Eyes:     General:        Right eye: No discharge.        Left eye: No discharge.  Pulmonary:     Effort: Pulmonary effort is normal. No respiratory distress.  Neurological:     Mental Status: She is alert.     Coordination: Coordination normal.  Psychiatric:        Behavior: Behavior normal.     ED Course / MDM  EKG:EKG Interpretation  Date/Time:  Friday September 29 2018 14:29:41 EDT Ventricular Rate:  77 PR Interval:  152 QRS Duration: 88 QT Interval:  422 QTC Calculation: 477 R Axis:   83 Text Interpretation:  Normal sinus rhythm Normal ECG When compared with ECG of 09/24/2018, No significant change was found Confirmed by Delora Fuel ([id number]) on 09/30/2018 12:51:52 AM    I have reviewed the labs performed to date as well as medications administered while in observation.  No recent changes in the last 24 hours. Plan  Current plan is for placement, CSW working with patient and guardian to find placement. Patient is under full IVC at this time.   Jacqlyn Larsen, PA-C 10/01/18 6045    Pattricia Boss, MD 10/01/18 [phone]

## 2018-10-01 NOTE — Consult Note (Signed)
Chart reviewed per social worker's request for medication management.  Patient appears to be stable with current medications. No changes are recommended at this time. Patient was psychiatrically cleared on 09/25/2018. Continue with agitation protocol as needed.

## 2018-10-01 NOTE — ED Notes (Signed)
Pt lying on bed watching tv. States she is feeling "OK" this am. Pt then asking "Who owns this hospital? It is [facility], correct? What is the address?"

## 2018-10-01 NOTE — Progress Notes (Signed)
CSW met with patient per patient request. CSW provided patient with an overview and process towards discharge in initially seeking SNF placement, then assisted living placement, and discussed the barriers to finding placement. CSW informed patient that staff have additionally been working with her ex-husband, her legal guardian, to work towards discharge and find supports to return home but at this time does not have consent. CSW noted patient wanted to leave today and processed the negative feelings of being in the ED for the past ten days. CSW processed the feelings and verbalized affirmation at the difficulty of discharge with her own wants and legal guardian's wants. CSW informed patient that other options will be reviewed tomorrow and that CSW can not promise a discharge date.  CSW spoke with RN who noted patient's behaviors have been less pronounced with a recent PRN added and called covering provider Tamika at RN request to see what kind of evaluation could be done to review medication.  Lamonte Richer, LCSW, [place] Worker II [phone]

## 2018-10-01 NOTE — ED Notes (Signed)
Pt refusing Lactulose and Pro-Stat.

## 2018-10-01 NOTE — ED Notes (Signed)
Lunch tray ordered 

## 2018-10-02 NOTE — ED Notes (Signed)
Pt awoke breifly but was asleep again once this RN returned with meds. Will give when pt is awake as per previous nurse pt did not sleep on night shift.

## 2018-10-02 NOTE — ED Notes (Signed)
Pt pull her rectal tube out letting all the poop on the bed and floor. Pt cleaned and dressed hospital gown.

## 2018-10-02 NOTE — ED Notes (Signed)
Rectal tube in per Dr. Christy Gentles order for loose stool incontinence to prevent any skin breakdown.

## 2018-10-02 NOTE — Progress Notes (Signed)
CSW received call back from [facility] declining the referral for this pt. TOC team will continue follow pt for discharge needs   [place] [facility] of [facility]  Clinical Social Worker  Ph: [phone]

## 2018-10-02 NOTE — Progress Notes (Addendum)
1:30pm: CSW spoke with [place] at [facility] to confirm receipt of referral and Lowry Ram stated that it was received and a decision will be made within the next 24 hours.   11am: CSW spoke with [name] in admissions at [facility] who stated that the information that was sent over for this patient is still under review at this time.  CSW spoke with [place] at [facility] who agreed to review this patient's information. CSW sent over information for review.  8:45am: CSW spoke with Vaughan Basta, Network engineer at [facility] in [place], [place] who states that the admissions staff is not in yet and advised CSW to return call after 9am.  CSW spoke with [name] in admissions who agreed to review this patient's clinical information. CSW faxed over approximately 54 pages of notes for review.  Madilyn Fireman, MSW, LCSW-A Clinical Social Worker [facility] [phone]

## 2018-10-02 NOTE — ED Notes (Signed)
Anne Black pts daughter [phone]

## 2018-10-03 NOTE — ED Notes (Signed)
Pt escorted to phone at nurses' desk via w/c -pt attempted to call her daughter, Meriel Pica, back - left message.

## 2018-10-03 NOTE — ED Notes (Signed)
Pt continues to yell out for someone to come in her room even though there is a sitter outside of her room. Pt wants to continue to be argumentative. Told pt that her behavior will not be tolerated and that she will not speak or treat anyone the way she is treating her sitter. Pt states understanding and apologizes.

## 2018-10-03 NOTE — Progress Notes (Signed)
CSW received return call from [place] at [facility] in [place] who stated this patient has been denied admission into the facility.   Madilyn Fireman, MSW, LCSW-A Clinical Social Worker [facility] [phone]

## 2018-10-03 NOTE — ED Notes (Signed)
Pt has been using bedside commode. Pt continues w/diarrhea intermittently. Pt asking for multiple items repeatedly - wanting to speak w/SW, wanting narcotics for headache/generalized pain/abd pain.

## 2018-10-03 NOTE — ED Notes (Signed)
Breakfast tray ordered 

## 2018-10-03 NOTE — ED Notes (Signed)
Pt attempted to place phone call - no answer.

## 2018-10-03 NOTE — Discharge Planning (Signed)
EDCM spoke to Anne Black, guardian regarding disposition for pt. EDCM updated Guardian on latest declinations and suggests [facility] as an option.  Guardian refuses this as an option at this time due to his travel and his daughters' out of county residence- no one will be available for immediate needs.  Guardian stated pt would likely trash hotel and get evicted.    Guardian states he and family have been searching for a safe residence post discharge in tandem with [place].  He suggests [place] reaching out to [facility] Anne Black [phone]) in [place], [place].     Guardian states pt is insured under Medicare Part A.  EDCM and EDSW will continue to follow.

## 2018-10-03 NOTE — ED Notes (Signed)
Pt continues to argue about why she is here and wanting to leave. Explained the IVC process to her for the 5th time tonight.

## 2018-10-04 MED ORDER — NICOTINE 21 MG/24HR TD PT24
21.0000 mg | MEDICATED_PATCH | Freq: Once | TRANSDERMAL | Status: AC
  Administered 2018-10-04: 22:00:00 21 mg via TRANSDERMAL
  Filled 2018-10-04: qty 1

## 2018-10-04 NOTE — ED Notes (Signed)
ED Provider at bedside to assess concerns of increased redness on abdomen.

## 2018-10-04 NOTE — ED Notes (Signed)
Patient was given Cranberry and Sugar Free Cookies.

## 2018-10-04 NOTE — ED Notes (Signed)
Regular Diet was ordered for Lunch. 

## 2018-10-04 NOTE — ED Notes (Signed)
Patient refused vitals.

## 2018-10-04 NOTE — ED Provider Notes (Signed)
Informed by RN that pt has concern ofr infection on abd.   On my evaluation, pt has old-appearing skin tear of abd without surrounding erytehma or induration. No active drainage. Pt states she is picking at the skin/scab and making it bleeding. Skin tear is along previous incision site.   Pt upset that she is not going home.  I discussed the concern is where she will be discharged to.   Nicotine patch given. Patient requesting something for sleep at night, has Vistaril ordered which she can have at night as needed.   Franchot Heidelberg, PA-C 10/04/18 1929    Hayden Rasmussen, MD 10/05/18 1329

## 2018-10-04 NOTE — ED Notes (Signed)
Pt mom Marge Hatley [phone] Cell [phone]

## 2018-10-04 NOTE — [place] FL2 (Addendum)
[place] MEDICAID FL2 LEVEL OF CARE SCREENING TOOL     IDENTIFICATION  Patient Name: Anne Black Birthdate: 10-Jun-1960 Sex: female Admission Date (Current Location): 09/20/2018  [facility] [place]  [place]   Facility and Address:  [facility], [address], [place], [place] [address]      Provider Number: [id number]  Attending Physician Name and Address:  Default, Provider, MD  Relative Name and Phone Number:  Claudie LeachReid Callan, [phone]    Current Level of Care: Hospital Recommended Level of Care:   Prior Approval Number:    Date Approved/Denied:   PASRR Number: [id number] [id number]  Discharge Plan:      Current Diagnoses: Patient Active Problem List   Diagnosis Date Noted  . ADHD (attention deficit hyperactivity disorder)   . Anemia 08/16/2018  . Palliative care by specialist   . Cellulitis of lower extremity   . Peripheral edema   . Atrial fibrillation with RVR (HCC) 06/13/2018  . Alcohol dependence (HCC) 05/06/2018  . Electrolyte disturbance 05/05/2018  . Alcohol withdrawal delirium (HCC) 04/28/2018  . Weakness generalized 04/27/2018  . Gait abnormality 04/24/2018  . Chronic hypoxemic respiratory failure (HCC) 04/11/2018  . Atrial fibrillation with rapid ventricular response (HCC) 03/23/2018  . Head injury   . Fall 03/19/2018  . Syncope 03/19/2018  . Orbital fracture 03/19/2018  . Scalp laceration 03/19/2018  . Acute respiratory failure with hypoxia and hypercarbia (HCC) 03/19/2018  . Abdominal tenderness 03/19/2018  . Acute on chronic respiratory failure with hypoxia (HCC) 03/17/2018  . Alcohol abuse with intoxication (HCC) 03/17/2018  . ETOH abuse   . Normocytic anemia 03/16/2018  . Acute metabolic encephalopathy 03/16/2018  . Lower extremity edema 03/16/2018  . Migraine 03/16/2018  . A-fib (HCC) 02/18/2018  . Community acquired pneumonia of left lower lobe of lung (HCC)   . Weakness 08/21/2017  . COPD exacerbation  (HCC) 06/12/2017  . Chest pain with moderate risk for cardiac etiology 05/30/2017  . Abnormal LFTs 05/30/2017  . Tachycardia 05/30/2017  . COPD (chronic obstructive pulmonary disease) (HCC) 04/19/2017  . Right rib fracture 04/15/2017  . Abdominal wall cellulitis 12/30/2016  . S/P exploratory laparotomy 12/30/2016  . Distal radius fracture, left 09/09/2016  . Thrombocytopenia (HCC) 09/09/2016  . Chest x-ray abnormality   . Umbilical hernia without obstruction and without gangrene 05/12/2016  . Itching 02/24/2016  . Ventral hernia without obstruction or gangrene 02/24/2016  . Goals of care, counseling/discussion   . Ascites   . Tachypnea   . Atrial flutter with rapid ventricular response (HCC)   . Hypomagnesemia   . Hypoxia   . Dehydration 01/22/2016  . Low back ache 12/30/2015  . Non-intractable vomiting with nausea   . Alcohol abuse with alcohol-induced mood disorder (HCC) 12/15/2015  . Acute alcoholism (HCC)   . Distended abdomen 11/04/2015  . Alcohol abuse   . Dyspnea   . Acute respiratory failure (HCC) 10/15/2015  . Ascites due to alcoholic cirrhosis (HCC) 10/15/2015  . Osteoporosis   . Multiple sclerosis (HCC)   . Alcoholic cirrhosis of liver with ascites (HCC) 08/22/2015  . Bipolar disorder, current episode mixed, moderate (HCC) 08/01/2015  . Alcohol use disorder, severe, dependence (HCC) 07/31/2015  . Macrocytic anemia- due to alcohol abuse with normal B12 & folate levels 05/31/2015  . Protein-calorie malnutrition, severe (HCC) 05/31/2015  . Hypokalemia 05/26/2015  . Alcohol withdrawal (HCC) 05/18/2015  . Abdominal pain 05/18/2015  . Anxiety 05/18/2015  . Stimulant abuse (HCC) 12/27/2014  . Nicotine  abuse 12/27/2014  . Hyperprolactinemia ([place]) 11/15/2014  . Dyslipidemia   . Alcohol use disorder, moderate, dependence ([place]) 10/30/2014  . Tobacco abuse 01/23/2014  . Bipolar 1 disorder ([place]) 04/07/2013  . Noncompliance with therapeutic plan 04/04/2013  . Hereditary  and idiopathic peripheral neuropathy 05/01/2012  . Depression 05/01/2012  . GERD (gastroesophageal reflux disease) 05/01/2012  . Osteoarthrosis, unspecified whether generalized or localized, involving lower leg 05/01/2012  . Hyponatremia 07/01/2011    Orientation RESPIRATION BLADDER Height & Weight     Self, Time, Situation, Place  Normal, O2(PRN oxygen) Incontinent Weight:   Height:     BEHAVIORAL SYMPTOMS/MOOD NEUROLOGICAL BOWEL NUTRITION STATUS  Verbally abusive   Incontinent Diet  AMBULATORY STATUS COMMUNICATION OF NEEDS Skin   Extensive Assist Verbally Normal                       Personal Care Assistance Level of Assistance  Bathing, Feeding, Dressing, Total care Bathing Assistance: Limited assistance Feeding assistance: Independent Dressing Assistance: Limited assistance Total Care Assistance: Limited assistance   Functional Limitations Info  Speech, Hearing, Sight Sight Info: Adequate Hearing Info: Adequate Speech Info: Adequate    SPECIAL CARE FACTORS FREQUENCY                       Contractures Contractures Info: Not present    Additional Factors Info  Code Status Code Status Info: Full Code Allergies Info: No known allergies           Current Medications (10/04/2018):  This is the current hospital active medication list Current Facility-Administered Medications  Medication Dose Route Frequency Provider Last Rate Last Dose  . acetaminophen (TYLENOL) tablet 650 mg  650 mg Oral Q4H PRN Varney Biles, MD   650 mg at 10/03/18 1647  . albuterol (PROVENTIL) (2.5 MG/3ML) 0.083% nebulizer solution 2.5 mg  2.5 mg Nebulization Q4H PRN Curatolo, Adam, DO      . calcium carbonate (TUMS - dosed in mg elemental calcium) chewable tablet 400 mg of elemental calcium  400 mg of elemental calcium Oral Daily Long, Wonda Olds, MD   400 mg of elemental calcium at 10/03/18 0912  . calcium gluconate 1 g/ 50 mL sodium chloride IVPB  1 g Intravenous Once Smith, Rondell  A, MD      . feeding supplement (PRO-STAT SUGAR FREE 64) liquid 30 mL  30 mL Oral BID Kathrynn Humble, Ankit, MD   30 mL at 19/50/93 2671  . folic acid (FOLVITE) tablet 1 mg  1 mg Oral Daily Nanavati, Ankit, MD   1 mg at 10/04/18 1016  . furosemide (LASIX) tablet 40 mg  40 mg Oral Daily Kathrynn Humble, Ankit, MD   40 mg at 10/04/18 1015  . gabapentin (NEURONTIN) capsule 200 mg  200 mg Oral TID Varney Biles, MD   200 mg at 10/04/18 1016  . Gerhardt's butt cream   Topical BID Blanchie Dessert, MD      . hydrocortisone (ANUSOL-HC) 2.5 % rectal cream   Rectal BID PRN Domenic Moras, PA-C      . hydrocortisone (ANUSOL-HC) suppository 25 mg  25 mg Rectal Once Domenic Moras, PA-C      . hydrOXYzine (ATARAX/VISTARIL) tablet 25 mg  25 mg Oral Daily PRN Varney Biles, MD   25 mg at 10/03/18 2116  . ipratropium (ATROVENT) nebulizer solution 0.5 mg  0.5 mg Nebulization Q4H PRN Curatolo, Adam, DO      . lactulose (CHRONULAC) 10 GM/15ML solution 20 g  20 g Oral BID Derwood KaplanNanavati, Ankit, MD   20 g at 10/01/18 2121  . lidocaine (XYLOCAINE) 2 % jelly 1 application  1 application Topical BID PRN Eyvonne MechanicHedges, Jeffrey, PA-C   1 application at 09/28/18 0840  . liver oil-zinc oxide (DESITIN) 40 % ointment   Topical BID Plunkett, Whitney, MD      . magnesium chloride (SLOW-MAG) 64 MG SR tablet 128 mg  2 tablet Oral Daily Cathren LaineSteinl, Kevin, MD   128 mg at 10/03/18 0912  . magnesium sulfate IVPB 2 g 50 mL  2 g Intravenous Once Madelyn FlavorsSmith, Rondell A, MD      . multivitamin with minerals tablet 1 tablet  1 tablet Oral Daily Rhunette CroftNanavati, Ankit, MD   1 tablet at 10/04/18 1015  . nystatin (MYCOSTATIN/NYSTOP) topical powder   Topical BID PRN Hedges, Tinnie GensJeffrey, PA-C      . OLANZapine (ZYPREXA) injection 10 mg  10 mg Intramuscular BID PRN Maryagnes AmosStarkes-Perry, Takia S, FNP   10 mg at 09/30/18 2003  . OLANZapine zydis (ZYPREXA) disintegrating tablet 5 mg  5 mg Oral Q8H PRN Maryagnes AmosStarkes-Perry, Takia S, FNP   5 mg at 10/04/18 1020  . ondansetron (ZOFRAN-ODT) disintegrating tablet  8 mg  8 mg Oral Q8H PRN Derwood KaplanNanavati, Ankit, MD   8 mg at 10/01/18 1039  . pantoprazole (PROTONIX) EC tablet 40 mg  40 mg Oral Daily Rhunette CroftNanavati, Ankit, MD   40 mg at 10/04/18 1015  . potassium chloride SA (K-DUR) CR tablet 40 mEq  40 mEq Oral Daily Arby BarrettePfeiffer, Marcy, MD   40 mEq at 10/04/18 1017  . thiamine (VITAMIN B-1) tablet 100 mg  100 mg Oral Daily Rhunette CroftNanavati, Ankit, MD   100 mg at 10/04/18 1016   Current Outpatient Medications  Medication Sig Dispense Refill  . Amino Acids-Protein Hydrolys (FEEDING SUPPLEMENT, PRO-STAT SUGAR FREE 64,) LIQD Take 30 mLs by mouth 2 (two) times daily. 887 mL 0  . folic acid (FOLVITE) 1 MG tablet Take 1 tablet (1 mg total) by mouth daily.    . furosemide (LASIX) 20 MG tablet Take 10 mg by mouth daily.    Marland Kitchen. gabapentin (NEURONTIN) 300 MG capsule Take 300 mg by mouth every 8 (eight) hours.    . hydrocortisone cream (PREPARATION H) 1 % Apply 1 application topically 3 (three) times daily as needed for itching.    . hydrOXYzine (ATARAX/VISTARIL) 25 MG tablet Take 1 tablet (25 mg total) by mouth daily as needed for up to 30 doses (anxiety). 30 tablet 0  . ipratropium-albuterol (DUONEB) 0.5-2.5 (3) MG/3ML SOLN Take 3 mLs by nebulization every 6 (six) hours as needed (wheezing, SOB).    . LORazepam (ATIVAN) 0.5 MG tablet Take 1 tablet (0.5 mg total) by mouth every 12 (twelve) hours as needed for anxiety. 6 tablet 0  . Multiple Vitamin (MULTIVITAMIN WITH MINERALS) TABS tablet Take 1 tablet by mouth daily.    . ondansetron (ZOFRAN ODT) 8 MG disintegrating tablet Take 1 tablet (8 mg total) by mouth every 8 (eight) hours as needed for nausea or vomiting. 10 tablet 0  . OXYGEN Inhale 2 L into the lungs as needed (to maintain levels).    . pantoprazole (PROTONIX) 20 MG tablet Take 2 tablets (40 mg total) by mouth daily. 40 tablet 0  . potassium chloride SA (K-DUR,KLOR-CON) 20 MEQ tablet Take 2 tablets (40 mEq total) by mouth 2 (two) times daily. 30 tablet 0  . spironolactone (ALDACTONE)  25 MG tablet Take 1 tablet (25 mg total) by mouth  daily for 30 days. 30 tablet 1  . thiamine 100 MG tablet Take 1 tablet (100 mg total) by mouth daily.    . furosemide (LASIX) 40 MG tablet Take 1 tablet (40 mg total) by mouth daily. (Patient not taking: Reported on 09/08/2018) 30 tablet 1  . gabapentin (NEURONTIN) 100 MG capsule Take 2 capsules (200 mg total) by mouth 3 (three) times daily for 30 days. 60 capsule 0  . lactulose (CHRONULAC) 10 GM/15ML solution Take 30 mLs (20 g total) by mouth 2 (two) times daily. (Patient not taking: Reported on 09/08/2018)       Discharge Medications: Please see discharge summary for a list of discharge medications.  Relevant Imaging Results:  Relevant Lab Results:   Additional Information SSN: [id number]  Inis Sizerarol L Braeleigh Pyper, LCSW

## 2018-10-04 NOTE — ED Notes (Signed)
Breakfast Ordered 

## 2018-10-04 NOTE — ED Notes (Signed)
This patient approached the nurse's station x3 in the last 30 mins. Pt insisted she has special dietary needs according to her neurologist from [facility] due to her GERD. Attempted to redirect patient regarding limits; and directed back to room.

## 2018-10-04 NOTE — Progress Notes (Addendum)
12:30pm: CSW in communication with Dr. Wilson Singer regarding patient. Adjustments will be made to patient's diagnoses to aide in discharge planning.   10am: CSW, TOC supervisor Denyse Amass, and Dr. Leilani Merl had a discussion about possible safe discharge options for this patient. Dr. Mariea Clonts is familiar with this patient as she has assessed her during this hospital stay and also in the past. CSW was advised to contact EDP to discuss obtaining a neuro consult for this patient.  CSW spoke with Martinique Robinson, PA about obtaining a neuro consult and provided an explanation as to why it would benefit the patient. PA to attempt to obtain neurology's involvement for this patient.  Madilyn Fireman, MSW, LCSW-A Clinical Social Worker [facility] [phone]

## 2018-10-05 NOTE — ED Notes (Addendum)
Attempted to call [name] with [facility] facility placement x2

## 2018-10-05 NOTE — Discharge Planning (Signed)
EDCM received call from Joneen Caraway ([place] guardian) asking for update on  [facility] in [place] referral.  [facility] informed Joneen Caraway that [facility] has reached out to center and are awaiting response from them.  Will update him as information comes available.Marland Kitchen

## 2018-10-05 NOTE — Progress Notes (Signed)
2nd shift CSW in contact with [name] [facility]: [phone]) at [facility] in [place]. [name] states that he just got done speaking with patient and completed assessment. [name] goes into detail and explains that he has sent over the assessment for review. [name] explains that the facility can expect to accept pt upon receiving medical record Hx paperwork.   1st shift CSW has already faxed over medical hx documentation needed to complete referral process. [name] inquires about if pt has been medically cleared, detoxed from alchohol and if pt can ambulate up and down stairs. CSW assured [name] that Pt ambulates well and has been free of alcohol since admission.   Pt needing assistance with coordinating gathering belongings from family members for her transfer to facility in [place].   TOC team will continue to follow pt for any discharge needs.   Expected date of discharge: 10/06/2018 to [facility]  Clinical Social Worker  Ph: [phone]

## 2018-10-05 NOTE — ED Notes (Signed)
Pt made second phone call to daughter at nurses station.

## 2018-10-05 NOTE — ED Notes (Signed)
[employer] called to serve IVC papers

## 2018-10-05 NOTE — ED Notes (Signed)
Pt is on phone with a pt placement coordinator(Michael) per social worker note

## 2018-10-05 NOTE — Progress Notes (Addendum)
12pm: CSW spoke with Sharyn Lull at [facility] who is agreeable to receive this patient's information for review. CSW faxed information to [phone].   10am: CSW attempted to reach admissions at [facility] without success.  CSW spoke with [name] [phone]) at [facility] in [place] regarding this patient. [name] states he is familiar with this patient as she was considered for admission in the past. [name] requesting a phone interview with this patient at some point today.  CSW spoke with Jenny Reichmann, RN about facilitating this phone interview. RN agreeable to assist with phone call whenever patient is in a cooperative state.  CSW updated patient's guardian Sabrea Sankey of information.  9am: CSW attempted to reach admissions staff at [facility] in [place] to request a review of this patient, no answer so a voicemail was left requesting a return call.  Madilyn Fireman, MSW, LCSW-A Clinical Social Worker [facility] [phone]

## 2018-10-05 NOTE — ED Notes (Signed)
Pt states "[name] has approved me to leave and I am ready to go now. My ex husband has spoken to Insurance claims handler, and you can let me go now to [place]," expresses concern about needing to find transport. Escorted pt back to her room, explained plan for pt to be interviewed by the [facility] facility, approve whether placement is appropriate, and that transport would be coordinated by staff. Pt continued to express escalating frustration, told pt that this RN would give her some space to relax and go back to the nurse's area until pt can speak constructively.

## 2018-10-05 NOTE — ED Notes (Addendum)
Called Michael from [facility] facility placement, requested call back in 30 min to finish with another pt

## 2018-10-05 NOTE — ED Notes (Signed)
Regular Diet was ordered for Lunch. 

## 2018-10-05 NOTE — ED Notes (Signed)
Pt frustrated and notified this NT. This NT notified pt that while her visit in purple zone all patients are required to stay within room unless needing to use the restroom or phone call and pt has a sitter that can answer her question and if sitter couldn't that the sitter will notify RN, pt understands. Pt disagrees with continuously going in and out of room, RN notifies this NT that pt does go in and out of room. Pt ask for this NT to leave room, NT acknowledges. Cindy(RN) aware of pt to NT conversation.

## 2018-10-05 NOTE — ED Notes (Signed)
EDP arrived to round on pt, attempted to reassess wound to abd, pt uncooperative and refused to be examined.

## 2018-10-05 NOTE — ED Notes (Signed)
Patient was given a Diet Sprite Zero.

## 2018-10-05 NOTE — ED Provider Notes (Signed)
I was asked to renew IVC paperwork.  Patient has been here for quite some time.  Patient does not appear to have decision-making capacity based on prior exams.  Will renew IVC as she is currently awaiting placement.   Sherwood Gambler, MD 10/05/18 [phone]

## 2018-10-05 NOTE — ED Notes (Signed)
Called to pts room by sitter. Pt has been "picking at her scab on her belly" and now it is oozing blood. Pt has a blood stain on her gown. Looked at site and it is not actively bleeding, but does have fresh blood on it. Pt states the incision is about a year old but she continues to pick at it and it keeps opening up the scab. Pt refuses for this RN to clean it or apply a bandage. Pt is watching the [facility] convention and this RN will try again after it goes off.

## 2018-10-05 NOTE — ED Notes (Signed)
Pt walks out to nursing desk, complains that she thinks that her tech has an attitude (tech has been attentive to needs and courteous). Pt escorted back to room, expresses irritation towards this RN and other staff. Asks to call back [name] from previous note (staff who spoke to her earlier from placement facility.) Explained to pt that [name] was to call us back, not [place] call him, that if he has not called back by 1p we will contact him to follow-up. Pt expresses irritation at this.Pt requests to use one of her phone calls at this time, aware that she may only make two 5 minute calls per day. Pt is uncooperative and agitated while making this call. On her way to her room (walks independently), pt states that she would rather use BSC in room instead of bathroom because it is "[the tech's] job to clean it up"

## 2018-10-06 MED ORDER — NICOTINE 21 MG/24HR TD PT24
21.0000 mg | MEDICATED_PATCH | Freq: Once | TRANSDERMAL | Status: AC
  Administered 2018-10-06: 15:00:00 21 mg via TRANSDERMAL
  Filled 2018-10-06: qty 1

## 2018-10-06 NOTE — ED Notes (Signed)
Breakfast tray ordered 

## 2018-10-06 NOTE — Progress Notes (Addendum)
1:45pm: CSW spoke with [name] to obtain any updates about this patient, [name] states that her admission is still currently pending.  8:30am: CSW spoke with [name] at [facility] who stated this patient would likely be accepted into the facility today after clinical information was provided for review. CSW faxed information to [facility] for review. CSW will await return call from [name] to facilitate transfer.  Madilyn Fireman, MSW, LCSW-A Clinical Social Worker [facility] [phone]

## 2018-10-06 NOTE — ED Notes (Signed)
Pt's dinner ordered °

## 2018-10-06 NOTE — Progress Notes (Signed)
CSW at bedside to provide update regarding disposition to patient. CSW later reached out to  [name] at [facility]: [phone]) to get an update on application process. CSW left VM requesting call back.   [place] [facility] of [facility]  Clinical Social Worker  Ph: [phone]

## 2018-10-06 NOTE — ED Notes (Signed)
Pt awoke breifly then immediately went back to sleep, will give daily meds when pt is more awake

## 2018-10-07 NOTE — ED Notes (Signed)
Pt now awake - asking for Diet Ginger Ale - advised she may have w/dinner. Pt refused Neurontin.

## 2018-10-07 NOTE — ED Notes (Signed)
Breakfast ordered 

## 2018-10-07 NOTE — ED Notes (Signed)
Pt refused to let this tech get vital signs. Pt upset at this time that she is here.

## 2018-10-07 NOTE — ED Notes (Signed)
Pt eating breakfast and asking for additional food. - pt states she wants another breakfast tray. Offered pt sandwich - declined.

## 2018-10-07 NOTE — Progress Notes (Signed)
CSW received call from [place] with the transportation department for [facility]. Per Harrington Challenger they will pick patient up at 10:00AM Monday and left the following number for call back if any issues arise, [phone].  Lamonte Richer, LCSW, [place] Worker II [phone]

## 2018-10-07 NOTE — Progress Notes (Signed)
CSW contacted [facility] facility contact [name] and left a HIPPA compliant v/m. CSW contacted facility and spoke with Abbe Amsterdam in admissions who informed CSW they will follow-up and have someone reach out to [place] soon.  Lamonte Richer, LCSW, [place] Worker II [phone]

## 2018-10-07 NOTE — ED Notes (Signed)
Pt eating lunch - asking for salad to be ordered as she was able to get one ordered 2 days ago. Advised pt no special orders - house trays only.

## 2018-10-07 NOTE — Progress Notes (Signed)
Patient will discharge Monday to [facility] and they will provide transportation.   Lamonte Richer, LCSW, [place] Worker II [phone]

## 2018-10-08 NOTE — ED Provider Notes (Signed)
Emergency Medicine Observation Re-evaluation Note  Anne Black is a 58 y.o. female, seen on rounds today.  Pt initially presented to the ED for complaints of Altered Mental Status Currently, the patient is asking for Tylenol for headache.  She states that this is her usual headache.  She is upset and wishes to go home.  She has IVC.  Physical Exam  BP (!) 141/85 (BP Location: Left Arm)   Pulse 97   Temp 98.1 F (36.7 C) (Oral)   Resp 20   SpO2 97%  Physical Exam Vitals signs and nursing note reviewed.  Constitutional:      General: She is not in acute distress.    Comments: Sitting up in bed, eating  Neurological:     Mental Status: She is alert.     Comments: Facial movements are symmetric, speech is not slurred.  Psychiatric:     Comments: Agitated, says that she wants to leave, that she "did not sign up to spend the rest of my life in a facility."     ED Course / MDM  EKG:EKG Interpretation  Date/Time:  Friday September 29 2018 14:29:41 EDT Ventricular Rate:  77 PR Interval:  152 QRS Duration: 88 QT Interval:  422 QTC Calculation: 477 R Axis:   83 Text Interpretation:  Normal sinus rhythm Normal ECG When compared with ECG of 09/24/2018, No significant change was found Confirmed by Delora Fuel ([id number]) on 09/30/2018 12:51:52 AM    I have reviewed the labs performed to date as well as medications administered while in observation.  Recent changes in the last 24 hours include none. Plan  Current plan is for disposition in the next 1 to 2 days. Patient is under full IVC at this time.   Lorin Glass, PA-C 10/08/18 Thomson, [place], DO 10/08/18 1216

## 2018-10-08 NOTE — ED Notes (Signed)
Gave pt ice water, per [facility] - Therapist, sports.

## 2018-10-08 NOTE — ED Notes (Signed)
Pt bathing in room - declined to shower.

## 2018-10-08 NOTE — ED Notes (Signed)
Pt is not allowing sitter to take Vital Signs. Sitter informed this NT and Nurse.

## 2018-10-08 NOTE — Progress Notes (Signed)
[facility] called CSW and informed they would need to change time of pickup to 11:00AM Monday.  Lamonte Richer, LCSW, [place] Worker II [phone]

## 2018-10-09 MED ORDER — STERILE WATER FOR INJECTION IJ SOLN
INTRAMUSCULAR | Status: AC
  Administered 2018-10-09: 2.1 mL
  Filled 2018-10-09: qty 10

## 2018-10-09 MED ORDER — OLANZAPINE 5 MG PO TABS: 5.0000 mg | ORAL_TABLET | Freq: Three times a day (TID) | ORAL | 0 refills | Status: AC | PRN

## 2018-10-09 NOTE — ED Provider Notes (Signed)
10:30 AM Nursing reports that patient finally has obtained placement for her further psychiatric management.  They request a prescription printed of Zyprexa 5 mg every 8 as needed for agitation for this rehab facility to utilize.  Patient otherwise has not had any new complaints and will be discharged to this facility.  The behavioral team requested we resend the IVC so she can be safely discharged to this facility.  Patient discharged for further management of the rehab facility   Tegeler, Gwenyth Allegra, MD 10/09/18 2008

## 2018-10-09 NOTE — Discharge Planning (Signed)
[facility] met with pt at bedside prior to discharge.  Pt encourage to put forth best effort so she may live on her own in the future.  Pt apprehensive, but agreed to go with transport to [facility] in [place], [place].

## 2018-10-09 NOTE — Progress Notes (Addendum)
11am: CSW present on unit to encourage patient to go to [facility] for treatment for her alcohol abuse. Patient repeatedly stated that she did not want to go and that CSW could not make her go. CSW informed patient that her legal guardian had made this decision on her behalf with her best interest in mind. CSW and RN Lowella Petties spoke with charge RN Claiborne Billings about patient refusing to go. Charge advised CSW and RN to have patient escorted out wit security if she continues to refuse. CSW requested RN CM to provide her presence and offer support and encouragement.  8am: CSW spoke with Harrington Challenger, a driver from [employer] who confirmed this patient's pickup time of 11am. Harrington Challenger states that Raquel Sarna will be the patient's driver today. CSW confirmed that a second COVID screening was not required due to the center having testing capabilities. CSW updated patient's legal guardian Kamya Watling. Joneen Caraway is to bring a suitcase for the patient to travel with. CSW updated patient's RN Lowella Petties of plan.  Madilyn Fireman, MSW, LCSW-A Clinical Social Worker [facility] [phone]

## 2018-10-10 ENCOUNTER — Emergency Department (HOSPITAL_COMMUNITY)
Admission: EM | Admit: 2018-10-10 | Discharge: 2018-10-11 | Disposition: A | Discharge status: Home / Self Care | Payer: Self-pay | Attending: Emergency Medicine | Admitting: Emergency Medicine

## 2018-10-10 DIAGNOSIS — E876 Hypokalemia: Secondary | ICD-10-CM

## 2018-10-10 DIAGNOSIS — D649 Anemia, unspecified: Secondary | ICD-10-CM

## 2018-10-10 DIAGNOSIS — I4891 Unspecified atrial fibrillation: Secondary | ICD-10-CM | POA: Insufficient documentation

## 2018-10-10 DIAGNOSIS — R109 Unspecified abdominal pain: Secondary | ICD-10-CM | POA: Insufficient documentation

## 2018-10-10 DIAGNOSIS — F17210 Nicotine dependence, cigarettes, uncomplicated: Secondary | ICD-10-CM | POA: Insufficient documentation

## 2018-10-10 DIAGNOSIS — Z79899 Other long term (current) drug therapy: Secondary | ICD-10-CM | POA: Insufficient documentation

## 2018-10-10 DIAGNOSIS — F319 Bipolar disorder, unspecified: Secondary | ICD-10-CM

## 2018-10-10 DIAGNOSIS — Z9981 Dependence on supplemental oxygen: Secondary | ICD-10-CM | POA: Insufficient documentation

## 2018-10-10 DIAGNOSIS — J449 Chronic obstructive pulmonary disease, unspecified: Secondary | ICD-10-CM | POA: Insufficient documentation

## 2018-10-10 NOTE — ED Triage Notes (Signed)
Patient here via [facility] IVC. Hx of bipolar and borderline disorder. Substance abuse. No taking meds. Aggressive behavior.

## 2018-10-11 ENCOUNTER — Emergency Department (HOSPITAL_COMMUNITY): Payer: Self-pay

## 2018-10-11 ENCOUNTER — Encounter (HOSPITAL_COMMUNITY): Payer: Self-pay | Admitting: Emergency Medicine

## 2018-10-11 LAB — CBC WITH DIFFERENTIAL/PLATELET
Abs Immature Granulocytes: 0.01 10*3/uL (ref 0.00–0.07)
Basophils Absolute: 0 10*3/uL (ref 0.0–0.1)
Basophils Relative: 1 %
Eosinophils Absolute: 0 10*3/uL (ref 0.0–0.5)
Eosinophils Relative: 0 %
HCT: 26.2 % — ABNORMAL LOW (ref 36.0–46.0)
Hemoglobin: 8.4 g/dL — ABNORMAL LOW (ref 12.0–15.0)
Immature Granulocytes: 0 %
Lymphocytes Relative: 19 %
Lymphs Abs: 0.9 10*3/uL (ref 0.7–4.0)
MCH: 30.2 pg (ref 26.0–34.0)
MCHC: 32.1 g/dL (ref 30.0–36.0)
MCV: 94.2 fL (ref 80.0–100.0)
Monocytes Absolute: 0.6 10*3/uL (ref 0.1–1.0)
Monocytes Relative: 13 %
Neutro Abs: 3 10*3/uL (ref 1.7–7.7)
Neutrophils Relative %: 67 %
Platelets: 153 10*3/uL (ref 150–400)
RBC: 2.78 MIL/uL — ABNORMAL LOW (ref 3.87–5.11)
RDW: 21.2 % — ABNORMAL HIGH (ref 11.5–15.5)
WBC: 4.5 10*3/uL (ref 4.0–10.5)
nRBC: 0 % (ref 0.0–0.2)

## 2018-10-11 LAB — BODY FLUID CELL COUNT WITH DIFFERENTIAL
Lymphs, Fluid: 65 %
Monocyte-Macrophage-Serous Fluid: 34 % — ABNORMAL LOW (ref 50–90)
Neutrophil Count, Fluid: 1 % (ref 0–25)
Total Nucleated Cell Count, Fluid: 95 cu mm (ref 0–1000)

## 2018-10-11 LAB — COMPREHENSIVE METABOLIC PANEL
ALT: 16 U/L (ref 0–44)
AST: 28 U/L (ref 15–41)
Albumin: 2.3 g/dL — ABNORMAL LOW (ref 3.5–5.0)
Alkaline Phosphatase: 85 U/L (ref 38–126)
Anion gap: 6 (ref 5–15)
BUN: 6 mg/dL (ref 6–20)
CO2: 29 mmol/L (ref 22–32)
Calcium: 7.9 mg/dL — ABNORMAL LOW (ref 8.9–10.3)
Chloride: 101 mmol/L (ref 98–111)
Creatinine, Ser: 0.62 mg/dL (ref 0.44–1.00)
GFR calc Af Amer: >60 mL/min (ref >60)
GFR calc non Af Amer: >60 mL/min (ref >60)
Glucose, Bld: 117 mg/dL — ABNORMAL HIGH (ref 70–99)
Potassium: 2.9 mmol/L — ABNORMAL LOW (ref 3.5–5.1)
Sodium: 136 mmol/L (ref 135–145)
Total Bilirubin: 1.1 mg/dL (ref 0.3–1.2)
Total Protein: 6.2 g/dL — ABNORMAL LOW (ref 6.5–8.1)

## 2018-10-11 LAB — I-STAT BETA HCG BLOOD, ED (MC, WL, AP ONLY): I-stat hCG, quantitative: <5 m[IU]/mL (ref <5)

## 2018-10-11 LAB — ETHANOL: Alcohol, Ethyl (B): <10 mg/dL (ref <10)

## 2018-10-11 MED ORDER — ACETAMINOPHEN 325 MG PO TABS
650.0000 mg | ORAL_TABLET | ORAL | Status: DC | PRN
  Administered 2018-10-11: 650 mg via ORAL
  Filled 2018-10-11: qty 2

## 2018-10-11 MED ORDER — VITAMIN B-1 100 MG PO TABS
100.0000 mg | ORAL_TABLET | Freq: Every day | ORAL | Status: DC
  Administered 2018-10-11: 100 mg via ORAL
  Filled 2018-10-11: qty 1

## 2018-10-11 MED ORDER — IPRATROPIUM-ALBUTEROL 0.5-2.5 (3) MG/3ML IN SOLN: 3.0000 mL | Freq: Four times a day (QID) | RESPIRATORY_TRACT | Status: DC | PRN

## 2018-10-11 MED ORDER — ONDANSETRON HCL 4 MG PO TABS: 4.0000 mg | ORAL_TABLET | Freq: Three times a day (TID) | ORAL | Status: DC | PRN

## 2018-10-11 MED ORDER — OLANZAPINE 5 MG PO TABS: 5.0000 mg | ORAL_TABLET | Freq: Three times a day (TID) | ORAL | Status: DC | PRN

## 2018-10-11 MED ORDER — SODIUM CHLORIDE (PF) 0.9 % IJ SOLN
INTRAMUSCULAR | Status: AC
  Filled 2018-10-11: qty 50

## 2018-10-11 MED ORDER — IOHEXOL 300 MG/ML  SOLN: 100.0000 mL | Freq: Once | INTRAMUSCULAR | Status: DC | PRN

## 2018-10-11 MED ORDER — ALUM & MAG HYDROXIDE-SIMETH 200-200-20 MG/5ML PO SUSP: 30.0000 mL | Freq: Four times a day (QID) | ORAL | Status: DC | PRN

## 2018-10-11 MED ORDER — GABAPENTIN 300 MG PO CAPS
300.0000 mg | ORAL_CAPSULE | Freq: Three times a day (TID) | ORAL | Status: DC
  Filled 2018-10-11: qty 1

## 2018-10-11 MED ORDER — PRO-STAT SUGAR FREE PO LIQD
30.0000 mL | Freq: Two times a day (BID) | ORAL | Status: DC
  Filled 2018-10-11 (×3): qty 30

## 2018-10-11 MED ORDER — PANTOPRAZOLE SODIUM 40 MG PO TBEC
40.0000 mg | DELAYED_RELEASE_TABLET | Freq: Every day | ORAL | Status: DC
  Administered 2018-10-11: 40 mg via ORAL
  Filled 2018-10-11: qty 1

## 2018-10-11 MED ORDER — LIDOCAINE HCL (PF) 1 % IJ SOLN
5.0000 mL | Freq: Once | INTRAMUSCULAR | Status: AC
  Administered 2018-10-11: 12:00:00 5 mL
  Filled 2018-10-11: qty 5

## 2018-10-11 MED ORDER — ADULT MULTIVITAMIN W/MINERALS CH
1.0000 | ORAL_TABLET | Freq: Every day | ORAL | Status: DC
  Administered 2018-10-11: 1 via ORAL
  Filled 2018-10-11: qty 1

## 2018-10-11 MED ORDER — FOLIC ACID 1 MG PO TABS
1.0000 mg | ORAL_TABLET | Freq: Every day | ORAL | Status: DC
  Administered 2018-10-11: 12:00:00 1 mg via ORAL
  Filled 2018-10-11: qty 1

## 2018-10-11 MED ORDER — NICOTINE 14 MG/24HR TD PT24
14.0000 mg | MEDICATED_PATCH | Freq: Every day | TRANSDERMAL | Status: DC
  Administered 2018-10-11: 12:00:00 14 mg via TRANSDERMAL
  Filled 2018-10-11: qty 1

## 2018-10-11 MED ORDER — FUROSEMIDE 20 MG PO TABS
10.0000 mg | ORAL_TABLET | Freq: Every day | ORAL | Status: DC
  Filled 2018-10-11 (×2): qty 0.5

## 2018-10-11 MED ORDER — SPIRONOLACTONE 25 MG PO TABS
25.0000 mg | ORAL_TABLET | Freq: Every day | ORAL | Status: DC
  Filled 2018-10-11 (×2): qty 1

## 2018-10-11 MED ORDER — POTASSIUM CHLORIDE CRYS ER 20 MEQ PO TBCR
40.0000 meq | EXTENDED_RELEASE_TABLET | ORAL | Status: DC
  Administered 2018-10-11 (×2): 40 meq via ORAL
  Filled 2018-10-11 (×2): qty 2

## 2018-10-11 NOTE — Progress Notes (Signed)
1100: Supervisor attempted to contact pt guardian, Anne Black.  No answer, left message. 1110: Supervisor spoke to Anne Black, pt daughter, regarding Anne Black request that she come pick pt up from [facility].  Anne Black quite upset, states she was the one who arranged treatment in [place] while pt was recently at [facility], states [facility] won't help her mother because she is uninsured.  Supervisor attempted to engaged with Anne Black about a plan moving forward, she stated she would not come pick pt up and hung up. 1200: Supervisor was able to reach Anne Black by phone, informed him that Anne Black refused to pick pt up.  Anne Black will contact his other daughter, said he is not in [place] right now and cannot come himself. Anne Black, MSW, LCSW Advanced Care Supervisor 10/11/2018 12:11 PM
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
## 2018-10-11 NOTE — B[facility] Counselor (Signed)
Per Lisette Grinder, RN pt is sleeping. Clinician to assess once pt is alert, oriented and able to engage.   Vertell Novak, [place], [facility], [facility] Triage Specialist [phone]

## 2018-10-11 NOTE — ED Provider Notes (Signed)
58 year old lady who presented originally to the emergency department after concern for medication noncompliance, abnormal behavior.  Initial work-up unremarkable except for some hypokalemia for which she received oral potassium, some anemia but unchanged from prior.  Plan for TTS evaluation and social work consultation in morning.  TTS evaluated patient, they recommended against any inpatient therapy and recommended outpatient therapy.  I personally evaluated patient and patient exhibited no SI, HI, is calm, very cooperative.  States recent facility she was at was concerned about abdominal distention and abdominal pain.  Patient's abdomen was mildly distended on my exam, mildly tender to palpation, she has known history of ascites.  CT abdomen pelvis was negative for acute process.  I performed a diagnostic paracentesis which was negative for SBP.  Believe patient from both medical and psychiatric standpoint is stable for discharge after above work-up and consultations with family, social work, TTS.  Social work arranged transportation home with family.   Procedures: Diagnostic paracentesis Date: 10/11/18 Time: 10:00 AM Description: I reviewed risk benefits of patient with procedure, need to rule out SBP, patient agreeable.  Identified large fluid pocket in right lower quadrant using ultrasound.  Prepped with ChloraPrep, following sterile fashion, injected 3 mL of 1% lidocaine without epinephrine at site marked.  Then inserted 21-gauge needle, aspirated 10 mL of peritoneal fluid, yellow, clear.  Sent to lab.  Patient tolerated well, no immediate complications.  After the discussed management above, the patient was determined to be safe for discharge.  The patient was in agreement with this plan and all questions regarding their care were answered.  ED return precautions were discussed and the patient will return to the ED with any significant worsening of condition.   Lucrezia Starch, MD 10/11/18  1355

## 2018-10-11 NOTE — Progress Notes (Addendum)
11:00a CSW received call back from [place] stating that patient's daughter [name] was not happy during their conversation and hung up on him. CSW made a [facility] Coordination referral. Upon speaking to [facility], patient is already in their system, but they did take the new information.   10:19a CSW contacted patient's legal guardian Loraina Stauffer [phone]) to inform him that patient has been psych cleared and is ready to be picked up. Joneen Caraway reports he is currently out on the road for business and at the time in front of his [employer]. Joneen Caraway provided CSW with the numbers to his daughter who are currently in the area [place] - [phone] and Amedeo Plenty - [phone]).   CSW contacted [name] and informed her patient was ready to be picked up. [name] stated that she was under the impression that CSW was working on placement for patient. CSW explained that we are not currently working on placement for patient as there is no recommendation for placement and patient has been psych and medically cleared. Patient was admitted into a SA facility in [place] 2 days ago after being in the [facility] ED for 3 weeks. [name] reports that the placement did not workout due to patient being too medically complex. Olivia continuously asked that [place] work to get patient placement and she will assist on her end. CSW explained that due to the efforts that were made when she was at [facility] and over patient's several hospital visits that all efforts have been exhausted and we can not hold her in the ED. [name] stated that she is refusing to pick up patient unless placement can be secured.  CSW reached out to Advance Care Supervisor, Lurline Idol, to make him aware. Marya Amsler reports he will make phone calls to patient's guardian and family and get back to CSW, but finding placement for patient is not the recommendation at this time.   [place]en Circle, LCSW Transitions of Care Department [facility] ED [phone]
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
## 2018-10-11 NOTE — Patient Outreach (Signed)
CPSS tried to meet with the patient in order to provide substance use recovery support and provide information for substance use recovery resources. Patient reports that she did not want meet with CPSS and wants to continue to sleep. CPSS informed the patient that CPSS would leave information for substance use recovery resources at bedside. Some of these resources include residential/outpatient substance use treatment center list, information for [place], in-person AA meeting list, and CPSS contact information. Patient was discharged from [facility] yesterday due to the treatment center not being able to accommodate for the patient's medical needs.

## 2018-10-11 NOTE — ED Notes (Signed)
Per Agricultural consultant, Pt was picked up by "a daughter."

## 2018-10-11 NOTE — ED Notes (Signed)
Patient sleeping ; resting quietly

## 2018-10-12 LAB — PATHOLOGIST SMEAR REVIEW

## 2018-10-14 LAB — BODY FLUID CULTURE
Culture: NO GROWTH
Special Requests: NORMAL

## 2018-10-16 ENCOUNTER — Emergency Department (HOSPITAL_COMMUNITY)
Admission: EM | Admit: 2018-10-16 | Discharge: 2018-10-16 | Disposition: A | Discharge status: Home / Self Care | Payer: Self-pay | Attending: Emergency Medicine | Admitting: Emergency Medicine

## 2018-10-16 DIAGNOSIS — R0602 Shortness of breath: Secondary | ICD-10-CM | POA: Insufficient documentation

## 2018-10-16 DIAGNOSIS — F17210 Nicotine dependence, cigarettes, uncomplicated: Secondary | ICD-10-CM | POA: Insufficient documentation

## 2018-10-16 DIAGNOSIS — J449 Chronic obstructive pulmonary disease, unspecified: Secondary | ICD-10-CM | POA: Insufficient documentation

## 2018-10-16 MED ORDER — ALBUTEROL SULFATE HFA 108 (90 BASE) MCG/ACT IN AERS
2.0000 | INHALATION_SPRAY | Freq: Once | RESPIRATORY_TRACT | Status: AC
  Administered 2018-10-16: 12:00:00 2 via RESPIRATORY_TRACT
  Filled 2018-10-16: qty 6.7

## 2018-10-16 MED ORDER — PANTOPRAZOLE SODIUM 40 MG PO TBEC
40.0000 mg | DELAYED_RELEASE_TABLET | Freq: Once | ORAL | Status: AC
  Administered 2018-10-16: 40 mg via ORAL
  Filled 2018-10-16: qty 1

## 2018-10-16 NOTE — TOC Initial Note (Addendum)
Transition of Care [facility](TOC) - Initial/Assessment Note    Patient Details  Name: Anne Black MRN: [id number] Date of Birth: 12-20-1960  Transition of Care [facility](TOC) CM/SW Contact:    Elliot CousinShavis, Larry Alcock Ellen, RN Phone Number: [phone] 10/16/2018, 1:53 PM  Clinical Narrative:                 Spoke to pt and states she lives in her home alone. States her family moved her furniture out of the house and some they moved to one side in her Master Bedroom. States she does not believe the oxygen concentrator is in the home. Contacted dtr, Madilyn FiremanHayes states she did see green tanks but unable to give any details about oxygen. Pt believes her oxygen is arranged with [facility], [phone] . [employer] is closed for the Labor Day Holiday. Called the after hours number and waiting on call back. Left HIPAA compliant message for pt's ex husband, legal guardian to return call.   225 pm Received call from [facility] on call, Britta MccreedyBarbara, # [phone] and they will deliver two portable tanks to pt's ED room and put in a request to have [employer] [facility] on call go by patient's home today to verify oxygen is working properly. Contacted dtr Zollie ScaleOlivia, and states she is out of town. She can have her sister, [name] # [phone] check on pt tonight or tomorrow. She states oxygen is in the home that family did not remove. Will provide pt with taxi voucher to home. Contacted legal guardian, with no answer. Message left earlier today.   Expected Discharge Plan: Home/Self Care Barriers to Discharge: Equipment Delay   Patient Goals and CMS Choice        Expected Discharge Plan and Services Expected Discharge Plan: Home/Self Care   Discharge Planning Services: CM Consult                                          Prior Living Arrangements/Services     Patient language and need for interpreter reviewed:: Yes Do you feel safe going back to the place where you live?: Yes      Need for Family Participation in  Patient Care: Yes (Comment) Care giver support system in place?: Yes (comment)   Criminal Activity/Legal Involvement Pertinent to Current Situation/Hospitalization: No - Comment as needed  Activities of Daily Living      Permission Sought/Granted Permission sought to share information with : Guardian, Case Manager, PCP, Family Supports Permission granted to share information with : Yes, Verbal Permission Granted  Share Information with NAME: Claudie Leacheid Black, Evelena Asalivia Hinds, Hayes Deramo  Permission granted to share info w AGENCY: [facility]  Permission granted to share info w Relationship: daughter, ex husband  Permission granted to share info w Contact Information: [phone], [phone]  Emotional Assessment Appearance:: Appears older than stated age Attitude/Demeanor/Rapport: Engaged Affect (typically observed): Accepting Orientation: : Oriented to Self, Oriented to Place, Oriented to  Time, Oriented to Situation   Psych Involvement: No (comment)  Admission diagnosis:  shob Patient Active Problem List   Diagnosis Date Noted  . ADHD (attention deficit hyperactivity disorder)   . Anemia 08/16/2018  . Palliative care by specialist   . Cellulitis of lower extremity   . Peripheral edema   . Atrial fibrillation with RVR (HCC) 06/13/2018  . Alcohol  dependence ([place]) 05/06/2018  . Electrolyte disturbance 05/05/2018  . Alcohol withdrawal delirium ([place]) 04/28/2018  . Weakness generalized 04/27/2018  . Gait abnormality 04/24/2018  . Chronic hypoxemic respiratory failure ([place]) 04/11/2018  . Atrial fibrillation with rapid ventricular response ([place]) 03/23/2018  . Head injury   . Fall 03/19/2018  . Syncope 03/19/2018  . Orbital fracture 03/19/2018  . Scalp laceration 03/19/2018  . Acute respiratory failure with hypoxia and hypercarbia ([place]) 03/19/2018  . Abdominal tenderness 03/19/2018  . Acute on chronic respiratory failure with hypoxia ([place]) 03/17/2018  . Alcohol  abuse with intoxication ([place]) 03/17/2018  . ETOH abuse   . Normocytic anemia 03/16/2018  . Acute metabolic encephalopathy 94/85/4627  . Lower extremity edema 03/16/2018  . Migraine 03/16/2018  . A-fib ([place]) 02/18/2018  . Community acquired pneumonia of left lower lobe of lung ([place])   . Weakness 08/21/2017  . COPD exacerbation ([place]) 06/12/2017  . Chest pain with moderate risk for cardiac etiology 05/30/2017  . Abnormal LFTs 05/30/2017  . Tachycardia 05/30/2017  . COPD (chronic obstructive pulmonary disease) ([place]) 04/19/2017  . Right rib fracture 04/15/2017  . Abdominal wall cellulitis 12/30/2016  . S/P exploratory laparotomy 12/30/2016  . Distal radius fracture, left 09/09/2016  . Thrombocytopenia ([place]) 09/09/2016  . Chest x-ray abnormality   . Umbilical hernia without obstruction and without gangrene 05/12/2016  . Itching 02/24/2016  . Ventral hernia without obstruction or gangrene 02/24/2016  . Goals of care, counseling/discussion   . Ascites   . Tachypnea   . Atrial flutter with rapid ventricular response ([place])   . Hypomagnesemia   . Hypoxia   . Dehydration 01/22/2016  . Low back ache 12/30/2015  . Non-intractable vomiting with nausea   . Alcohol abuse with alcohol-induced mood disorder ([place]) 12/15/2015  . Acute alcoholism ([place])   . Distended abdomen 11/04/2015  . Alcohol abuse   . Dyspnea   . Acute respiratory failure ([place]) 10/15/2015  . Ascites due to alcoholic cirrhosis ([place]) 03/50/0938  . Osteoporosis   . Multiple sclerosis ([place])   . Alcoholic cirrhosis of liver with ascites ([place]) 08/22/2015  . Bipolar disorder, current episode mixed, moderate ([place]) 08/01/2015  . Alcohol use disorder, severe, dependence ([place]) 07/31/2015  . Macrocytic anemia- due to alcohol abuse with normal B12 & folate levels 05/31/2015  . Protein-calorie malnutrition, severe ([place]) 05/31/2015  . Hypokalemia 05/26/2015  . Alcohol withdrawal ([place]) 05/18/2015  . Abdominal pain 05/18/2015  .  Anxiety 05/18/2015  . Stimulant abuse ([place]) 12/27/2014  . Nicotine abuse 12/27/2014  . Hyperprolactinemia ([place]) 11/15/2014  . Dyslipidemia   . Alcohol use disorder, moderate, dependence ([place]) 10/30/2014  . Tobacco abuse 01/23/2014  . Bipolar 1 disorder ([place]) 04/07/2013  . Noncompliance with therapeutic plan 04/04/2013  . Hereditary and idiopathic peripheral neuropathy 05/01/2012  . Depression 05/01/2012  . GERD (gastroesophageal reflux disease) 05/01/2012  . Osteoarthrosis, unspecified whether generalized or localized, involving lower leg 05/01/2012  . Hyponatremia 07/01/2011   PCP:  Patient, No Pcp Per Pharmacy:   [facility] [place] - [address] [place] [address] [place] [address] Phone: [phone] Fax: [phone]     Social Determinants of Health (SDOH) Interventions    Readmission Risk Interventions No flowsheet data found.

## 2018-10-16 NOTE — ED Notes (Signed)
Took patient out to car and helped put her in the car with her daughter. Gave discharge teaching to daughter. Daughter states that home health refuses to give pt more oxygen tanks because pt smokes cigarettes near o2 tank. Educated daughter and pt that smoking near o2 tanks is a safety risk, daughter confirmed that she is aware of that but pt refuses to listen and will not stop smoking. Daughter states that pt will leave o2 tanks on and let them run dry. Daughter asked for RN to forward this information on so next time pt (her mother) is in the ED we won't try to give her more O2 tanks.

## 2018-10-16 NOTE — ED Notes (Signed)
Pts daughter Anne Black agreeable to come and pick up pt at time of discharge.

## 2018-10-16 NOTE — ED Notes (Signed)
Notified pts Legal guardian, Twila Rappa, of pt being provided with home O2.  Legal guardian also informed of pts pending discharge status.  He reported that he is not currently in town and unavailable to pick her up.  He recommended to call pts daughter, Jodi Geralds [phone]).  This RN attempted to contact daughter to arrange transport for pt and there was no answer at number provided.  Charge RN made aware.

## 2018-10-16 NOTE — ED Notes (Signed)
ED Provider at bedside. 

## 2018-10-16 NOTE — ED Provider Notes (Signed)
[facility]-EMERGENCY DEPT Provider Note   CSN: [id number] Arrival date & time: 10/16/18  1049     History   Chief Complaint Chief Complaint  Patient presents with  . Shortness of Breath    HPI Anne Black is a 58 y.o. female past medical history of alcoholic cirrhosis, chronic ascites, COPD on 3 L nasal cannula home oxygen, bipolar disorder, presenting to emergency department with complaint of shortness of breath.  She reports that she ran out of her home oxygen approximally 1 day ago and has been feeling increasingly short of breath this morning.  She reports she did not want to come into the emergency department but was "forced" by the paramedics, as she reportedly had a pulse ox of 80% on room air.  She is unable to provide any information as to who is supposed to replace her oxygen tanks, although she states "my ex-husband told me that there's a tank is somewhere in the house, but I can't find it."  She denies any fevers or chills.  She reports abdominal pain which she states is chronic and severe.  She reports nausea but denies vomiting.  She reports she chronically has some urinary and fecal incontinence and wears adult diapers at home.  The patient's chart review demonstrates multiple emergency department visits for variety of complaints, with extensive outreach efforts by [facility] social worker and case management to assist with placement.  Most recently the patient was seen in the emergency department on September 2 with similar complaint of abdominal pain. She had a CT abdomen performed that time which was negative for acute process, but noted severe cirrhosis with significant ascites.  She also had a diagnostic paracentesis done during that visit, which was negative for evidence of SBP (no organism growth, few WBC present).     HPI  Past Medical History:  Diagnosis Date  . ADHD (attention deficit hyperactivity disorder)   . Alcoholic cirrhosis (HCC)    with  ascites  . Alcoholism (HCC)   . Allergies   . Anxiety   . Arthritis   . Atrial fibrillation with RVR (HCC)   . Bipolar disorder (HCC)   . COPD (chronic obstructive pulmonary disease) (HCC)    wears 2L chronic O2  . Dyspnea   . EtOH dependence (HCC) 05/14/2018  . GERD (gastroesophageal reflux disease)   . History of hiatal hernia   . Hypokalemia   . Migraine   . Multiple sclerosis (HCC)   . Narcolepsy   . Osteoarthritis of knee   . Osteoporosis   . Pneumonia   . UTI (urinary tract infection) 08/11/2018    Patient Active Problem List   Diagnosis Date Noted  . ADHD (attention deficit hyperactivity disorder)   . Anemia 08/16/2018  . Palliative care by specialist   . Cellulitis of lower extremity   . Peripheral edema   . Atrial fibrillation with RVR (HCC) 06/13/2018  . Alcohol dependence (HCC) 05/06/2018  . Electrolyte disturbance 05/05/2018  . Alcohol withdrawal delirium (HCC) 04/28/2018  . Weakness generalized 04/27/2018  . Gait abnormality 04/24/2018  . Chronic hypoxemic respiratory failure (HCC) 04/11/2018  . Atrial fibrillation with rapid ventricular response (HCC) 03/23/2018  . Head injury   . Fall 03/19/2018  . Syncope 03/19/2018  . Orbital fracture 03/19/2018  . Scalp laceration 03/19/2018  . Acute respiratory failure with hypoxia and hypercarbia (HCC) 03/19/2018  . Abdominal tenderness 03/19/2018  . Acute on chronic respiratory failure with hypoxia (HCC) 03/17/2018  . Alcohol  abuse with intoxication (HCC) 03/17/2018  . ETOH abuse   . Normocytic anemia 03/16/2018  . Acute metabolic encephalopathy 03/16/2018  . Lower extremity edema 03/16/2018  . Migraine 03/16/2018  . A-fib (HCC) 02/18/2018  . Community acquired pneumonia of left lower lobe of lung (HCC)   . Weakness 08/21/2017  . COPD exacerbation (HCC) 06/12/2017  . Chest pain with moderate risk for cardiac etiology 05/30/2017  . Abnormal LFTs 05/30/2017  . Tachycardia 05/30/2017  . COPD (chronic  obstructive pulmonary disease) (HCC) 04/19/2017  . Right rib fracture 04/15/2017  . Abdominal wall cellulitis 12/30/2016  . S/P exploratory laparotomy 12/30/2016  . Distal radius fracture, left 09/09/2016  . Thrombocytopenia (HCC) 09/09/2016  . Chest x-ray abnormality   . Umbilical hernia without obstruction and without gangrene 05/12/2016  . Itching 02/24/2016  . Ventral hernia without obstruction or gangrene 02/24/2016  . Goals of care, counseling/discussion   . Ascites   . Tachypnea   . Atrial flutter with rapid ventricular response (HCC)   . Hypomagnesemia   . Hypoxia   . Dehydration 01/22/2016  . Low back ache 12/30/2015  . Non-intractable vomiting with nausea   . Alcohol abuse with alcohol-induced mood disorder (HCC) 12/15/2015  . Acute alcoholism (HCC)   . Distended abdomen 11/04/2015  . Alcohol abuse   . Dyspnea   . Acute respiratory failure (HCC) 10/15/2015  . Ascites due to alcoholic cirrhosis (HCC) 10/15/2015  . Osteoporosis   . Multiple sclerosis (HCC)   . Alcoholic cirrhosis of liver with ascites (HCC) 08/22/2015  . Bipolar disorder, current episode mixed, moderate (HCC) 08/01/2015  . Alcohol use disorder, severe, dependence (HCC) 07/31/2015  . Macrocytic anemia- due to alcohol abuse with normal B12 & folate levels 05/31/2015  . Protein-calorie malnutrition, severe (HCC) 05/31/2015  . Hypokalemia 05/26/2015  . Alcohol withdrawal (HCC) 05/18/2015  . Abdominal pain 05/18/2015  . Anxiety 05/18/2015  . Stimulant abuse (HCC) 12/27/2014  . Nicotine abuse 12/27/2014  . Hyperprolactinemia (HCC) 11/15/2014  . Dyslipidemia   . Alcohol use disorder, moderate, dependence (HCC) 10/30/2014  . Tobacco abuse 01/23/2014  . Bipolar 1 disorder (HCC) 04/07/2013  . Noncompliance with therapeutic plan 04/04/2013  . Hereditary and idiopathic peripheral neuropathy 05/01/2012  . Depression 05/01/2012  . GERD (gastroesophageal reflux disease) 05/01/2012  . Osteoarthrosis,  unspecified whether generalized or localized, involving lower leg 05/01/2012  . Hyponatremia 07/01/2011    Past Surgical History:  Procedure Laterality Date  . ABDOMINAL WALL DEFECT REPAIR N/A 12/30/2016   Procedure: EXPLORATORY LAPAROTOMY WITH REPAIR ABDOMINAL WALL VENTRAL HERNIA;  Surgeon: Emelia LoronWakefield, Takao Lizer, MD;  Location[facility] OR;  Service: General;  Laterality: N/A;  . APPLICATION OF WOUND VAC N/A 12/30/2016   Procedure: APPLICATION OF WOUND VAC;  Surgeon: Emelia LoronWakefield, Danyella Mcginty, MD;  [facility] OR;  Service: General;  Laterality: N/A;  . CESAREAN SECTION  [phone]  . FRACTURE SURGERY    . HERNIA REPAIR    . IR PARACENTESIS  06/14/2018  . IR PARACENTESIS  08/14/2018  . IR PARACENTESIS  08/18/2018  . MYRINGOTOMY WITH TUBE PLACEMENT Bilateral   . ORIF WRIST FRACTURE Left 09/09/2016   Procedure: OPEN REDUCTION INTERNAL FIXATION (ORIF) LEFT WRIST FRACTURE, LEFT CARPAL TUNNEL RELEASE;  Surgeon: Dominica SeverinGramig, William, MD;  Location: [facility] ORS;  Service: Orthopedics;  Laterality: Left;  . TONSILLECTOMY       OB History   No obstetric history on file.      Home Medications    Prior to Admission medications   Medication Sig Start  Date End Date Taking? Authorizing Provider  gabapentin (NEURONTIN) 100 MG capsule Take 2 capsules (200 mg total) by mouth 3 (three) times daily for 30 days. 07/19/18 10/16/18 Yes Domenic Moras, PA-C  gabapentin (NEURONTIN) 300 MG capsule Take 300 mg by mouth every 8 (eight) hours.   Yes [provider]  ondansetron (ZOFRAN ODT) 8 MG disintegrating tablet Take 1 tablet (8 mg total) by mouth every 8 (eight) hours as needed for nausea or vomiting. 09/05/18  Yes Jola Schmidt, MD  pantoprazole (PROTONIX) 20 MG tablet Take 2 tablets (40 mg total) by mouth daily. 07/29/18  Yes Mesner, Corene Cornea, MD  potassium chloride SA (K-DUR,KLOR-CON) 20 MEQ tablet Take 2 tablets (40 mEq total) by mouth 2 (two) times daily. 04/19/18  Yes Recardo Evangelist, PA-C  spironolactone (ALDACTONE) 25 MG  tablet Take 1 tablet (25 mg total) by mouth daily for 30 days. 06/27/18 10/16/18 Yes Bero, Barth Kirks, MD  albuterol (VENTOLIN HFA) 108 (90 Base) MCG/ACT inhaler Inhale 1-2 puffs into the lungs every 6 (six) hours as needed for wheezing or shortness of breath.    [provider]  Amino Acids-Protein Hydrolys (FEEDING SUPPLEMENT, PRO-STAT SUGAR FREE 64,) LIQD Take 30 mLs by mouth 2 (two) times daily. 09/12/18   Hosie Poisson, MD  folic acid (FOLVITE) 1 MG tablet Take 1 tablet (1 mg total) by mouth daily. 09/12/18   Hosie Poisson, MD  furosemide (LASIX) 20 MG tablet Take 10 mg by mouth daily.    [provider]  furosemide (LASIX) 40 MG tablet Take 1 tablet (40 mg total) by mouth daily. Patient not taking: Reported on 09/08/2018 06/27/18   Maudie Flakes, MD  hydrocortisone cream (PREPARATION H) 1 % Apply 1 application topically 3 (three) times daily as needed for itching.    [provider]  hydrOXYzine (ATARAX/VISTARIL) 25 MG tablet Take 1 tablet (25 mg total) by mouth daily as needed for up to 30 doses (anxiety). 04/14/18   Tegeler, Gwenyth Allegra, MD  ipratropium-albuterol (DUONEB) 0.5-2.5 (3) MG/3ML SOLN Take 3 mLs by nebulization every 6 (six) hours as needed (wheezing, SOB).    [provider]  lactulose (CHRONULAC) 10 GM/15ML solution Take 30 mLs (20 g total) by mouth 2 (two) times daily. Patient not taking: Reported on 09/08/2018 08/20/18   Samuella Cota, MD  LORazepam (ATIVAN) 0.5 MG tablet Take 1 tablet (0.5 mg total) by mouth every 12 (twelve) hours as needed for anxiety. 08/20/18   Samuella Cota, MD  Multiple Vitamin (MULTIVITAMIN WITH MINERALS) TABS tablet Take 1 tablet by mouth daily. 08/21/18   Samuella Cota, MD  OLANZapine (ZYPREXA) 5 MG tablet Take 1 tablet (5 mg total) by mouth every 8 (eight) hours as needed (agitation). 10/09/18   Tegeler, Gwenyth Allegra, MD  OXYGEN Inhale 2 L into the lungs as needed (to maintain levels).    [provider]   thiamine 100 MG tablet Take 1 tablet (100 mg total) by mouth daily. 08/21/18   Samuella Cota, MD    Family History Family History  Problem Relation Age of Onset  . Arrhythmia Mother   . Neuropathy Mother   . Coronary artery disease Mother   . Heart disease Mother   . Heart disease Father   . Hypertension Father   . Heart attack Father   . Multiple sclerosis Maternal Aunt     Social History Social History   Tobacco Use  . Smoking status: Current Every Day Smoker  Packs/day: 1.00    Years: 36.00    Pack years: 36.00    Types: Cigarettes  . Smokeless tobacco: Never Used  Substance Use Topics  . Alcohol use: Yes    Comment: Chronic ETOH abuse  . Drug use: No    Comment: Pt denied; UDS NA     Allergies   Patient has no known allergies.   Review of Systems Review of Systems  Constitutional: Negative for chills and fever.  Respiratory: Positive for shortness of breath. Negative for cough and wheezing.   Cardiovascular: Negative for chest pain and palpitations.  Gastrointestinal: Positive for abdominal distention, abdominal pain, diarrhea and nausea. Negative for constipation and vomiting.  Neurological: Negative for seizures and syncope.  All other systems reviewed and are negative.    Physical Exam Updated Vital Signs BP 116/61   Pulse (!) 102   Temp 98.2 F (36.8 C) (Oral)   Resp (!) 22   SpO2 96%   Physical Exam Vitals signs and nursing note reviewed.  Constitutional:      General: She is not in acute distress.    Appearance: She is well-developed.  HENT:     Head: Normocephalic and atraumatic.  Eyes:     Conjunctiva/sclera: Conjunctivae normal.  Neck:     Musculoskeletal: Neck supple.  Cardiovascular:     Rate and Rhythm: Normal rate and regular rhythm.     Heart sounds: No murmur.  Pulmonary:     Effort: Pulmonary effort is normal. No respiratory distress.     Breath sounds: Normal breath sounds.     Comments: Mild bilateral end  expiratory wheezing 95% on 3L [place] Abdominal:     General: There is distension.     Palpations: Abdomen is soft.     Tenderness: There is abdominal tenderness in the epigastric area and periumbilical area. There is no guarding or rebound.     Comments: Paraumbilical surgical scar appears clean, dry, intact, nonerythematous  Skin:    General: Skin is warm and dry.  Neurological:     Mental Status: She is alert.      ED Treatments / Results  Labs (all labs ordered are listed, but only abnormal results are displayed) Labs Reviewed - No data to display  EKG None  Radiology No results found.  Procedures Procedures (including critical care time)  Medications Ordered in ED Medications  albuterol (VENTOLIN HFA) 108 (90 Base) MCG/ACT inhaler 2 puff (2 puffs Inhalation Given 10/16/18 1130)  pantoprazole (PROTONIX) EC tablet 40 mg (40 mg Oral Given 10/16/18 1133)     Initial Impression / Assessment and Plan / ED Course  I have reviewed the triage vital signs and the nursing notes.  Pertinent labs & imaging results that were available during my care of the patient were reviewed by me and considered in my medical decision making (see chart for details).  58 year old female with medical history as noted above presenting to emergency department by EMS with an episode of hypoxia at home in the setting of having "run out of" her home oxygen supply.   She normally wears 3 L at home.  On 3 L  here in the ER she is satting 95%.  She demonstrates a mild end expiratory wheezing which we will treat with albuterol.  She otherwise has no fever or focal findings for pneumonia.  Likewise, given the likely cause of her hypoxia being lack of supplemental O2, I have a lower suspicion for acute coronary syndrome or pulmonary embolism.  She does demonstrate abdominal distention which was noted on prior ED charts.  This appears to be chronic for her.  There may be some element of diaphragmatic compression  from her ascites, and we discussed the need for her to have therapeutic paracentesis done as an outpatient.  However she is not in significant respiratory distress, and I do not believe she needs this procedure at this time.   She is unable to tell me how long she has been having abdominal pain for.  Isuspect her symptoms are likely chronic.  She has no signs or symptoms of SBP.  She had a negative diagnostic work-up for this approximately 5 days ago.  She also had a CT abdomen pelvis done on that recent visit which demonstrated no acute surgical or inflammatory processes.   I will reach out to our case manager about her home oxygen situation.  If she feels improved after albuterol with supplemental O2, we can discharge home.   Final Clinical Impressions(s) / ED Diagnoses   Final diagnoses:  SOB (shortness of breath)    ED Discharge Orders    None       Kaaren Nass, Kermit BaloMatthew J, MD 10/16/18 [phone]

## 2018-10-16 NOTE — Discharge Instructions (Signed)
Please follow up with your GI doctor or your primary care doctor about getting the fluid drained from your abdomen.

## 2018-10-16 NOTE — ED Triage Notes (Signed)
Pt BIBA from home.   Per EMS- Pt c/o "being unable to put on adult diaper and that made her short of breath."  Initial SpO2 on RA was 80%.  Pt reports being out of home supply of O2.  POA requested transport to nearest facility.   Pt refused  VS by EMS.

## 2018-10-27 IMAGING — CR DG CHEST 2V
2 series · 2 of 2 positions shown · non-contrast
Comparison: 10/15/2015

CLINICAL DATA: Shortness of breath and back pain

EXAM:
CHEST  2 VIEW

[chest lat]
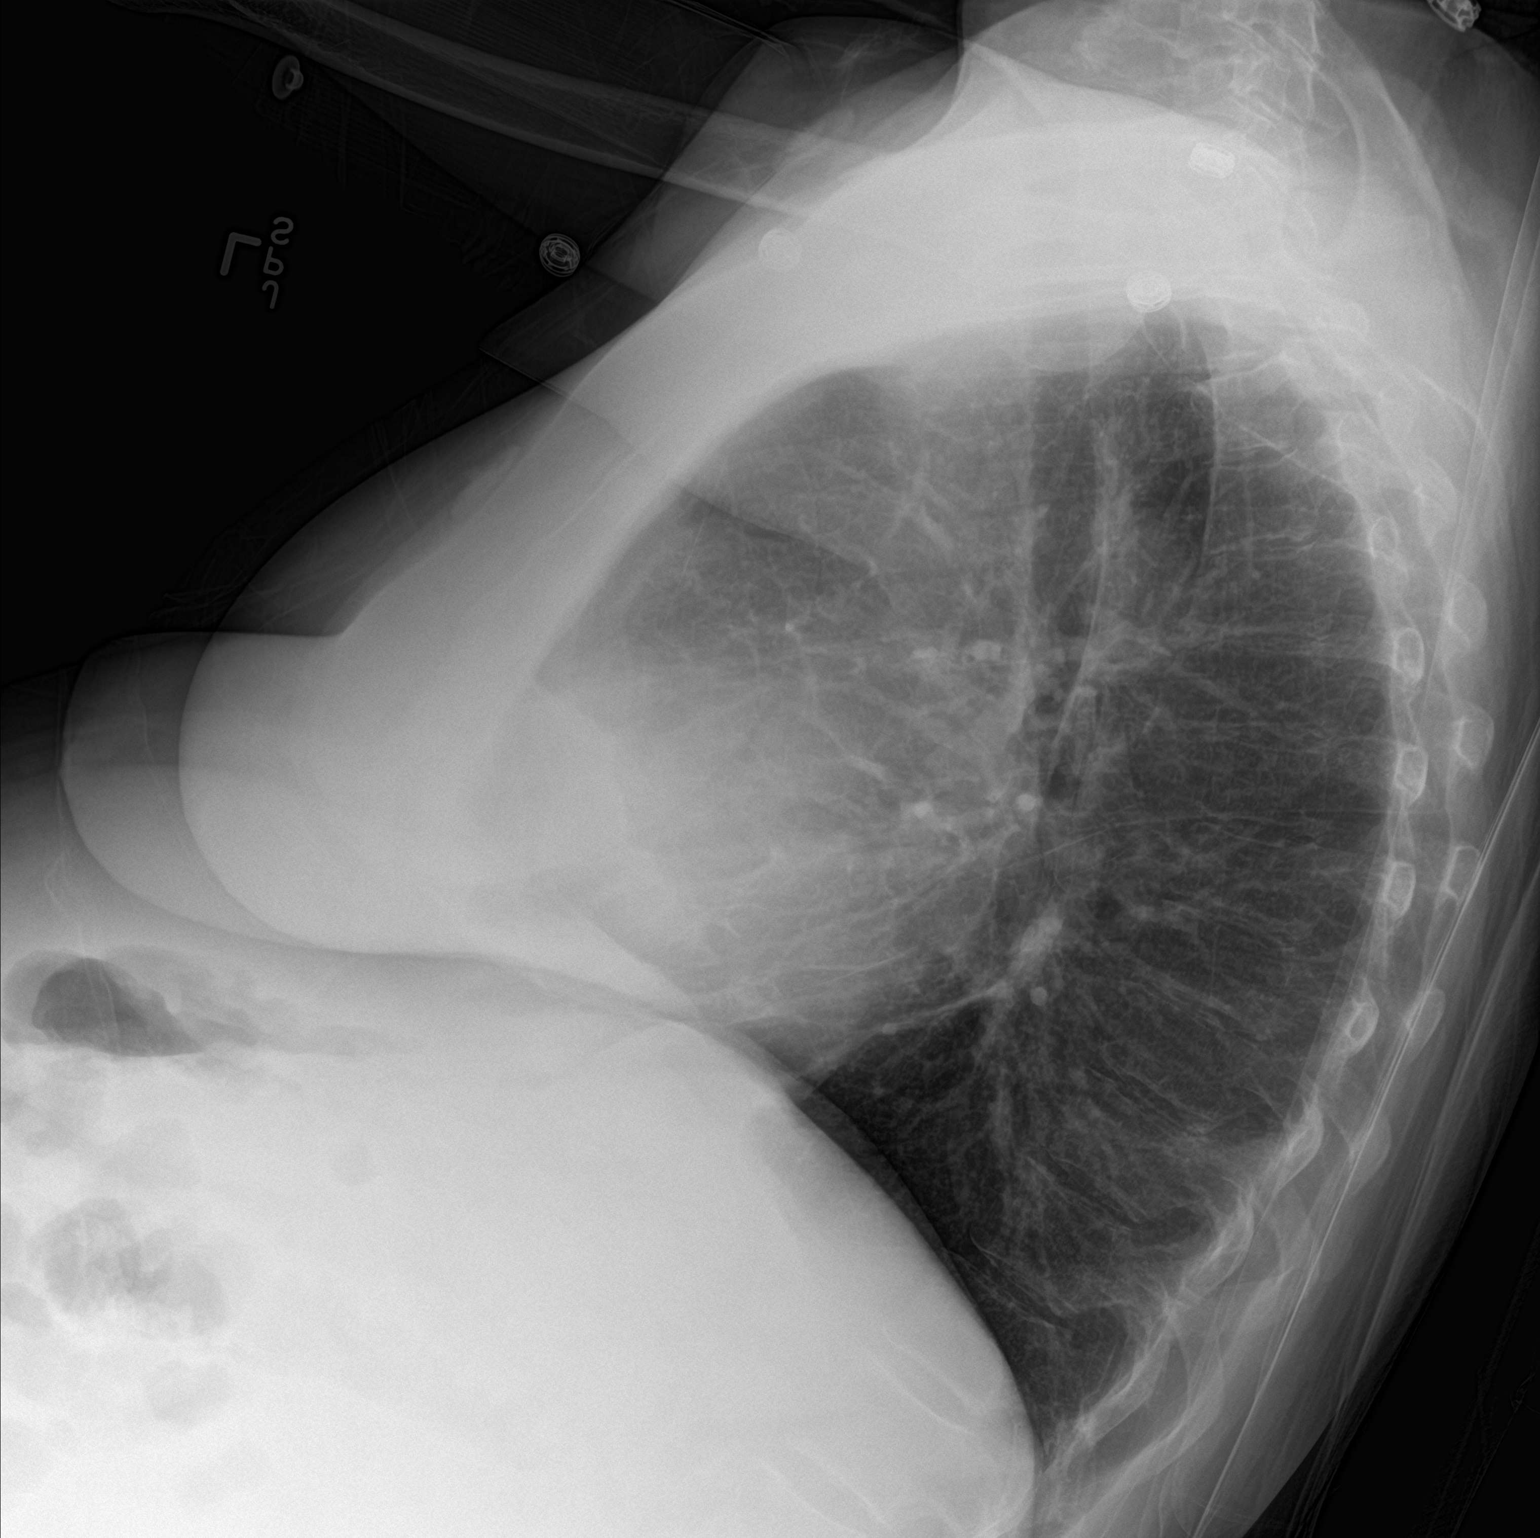

[chest ap]
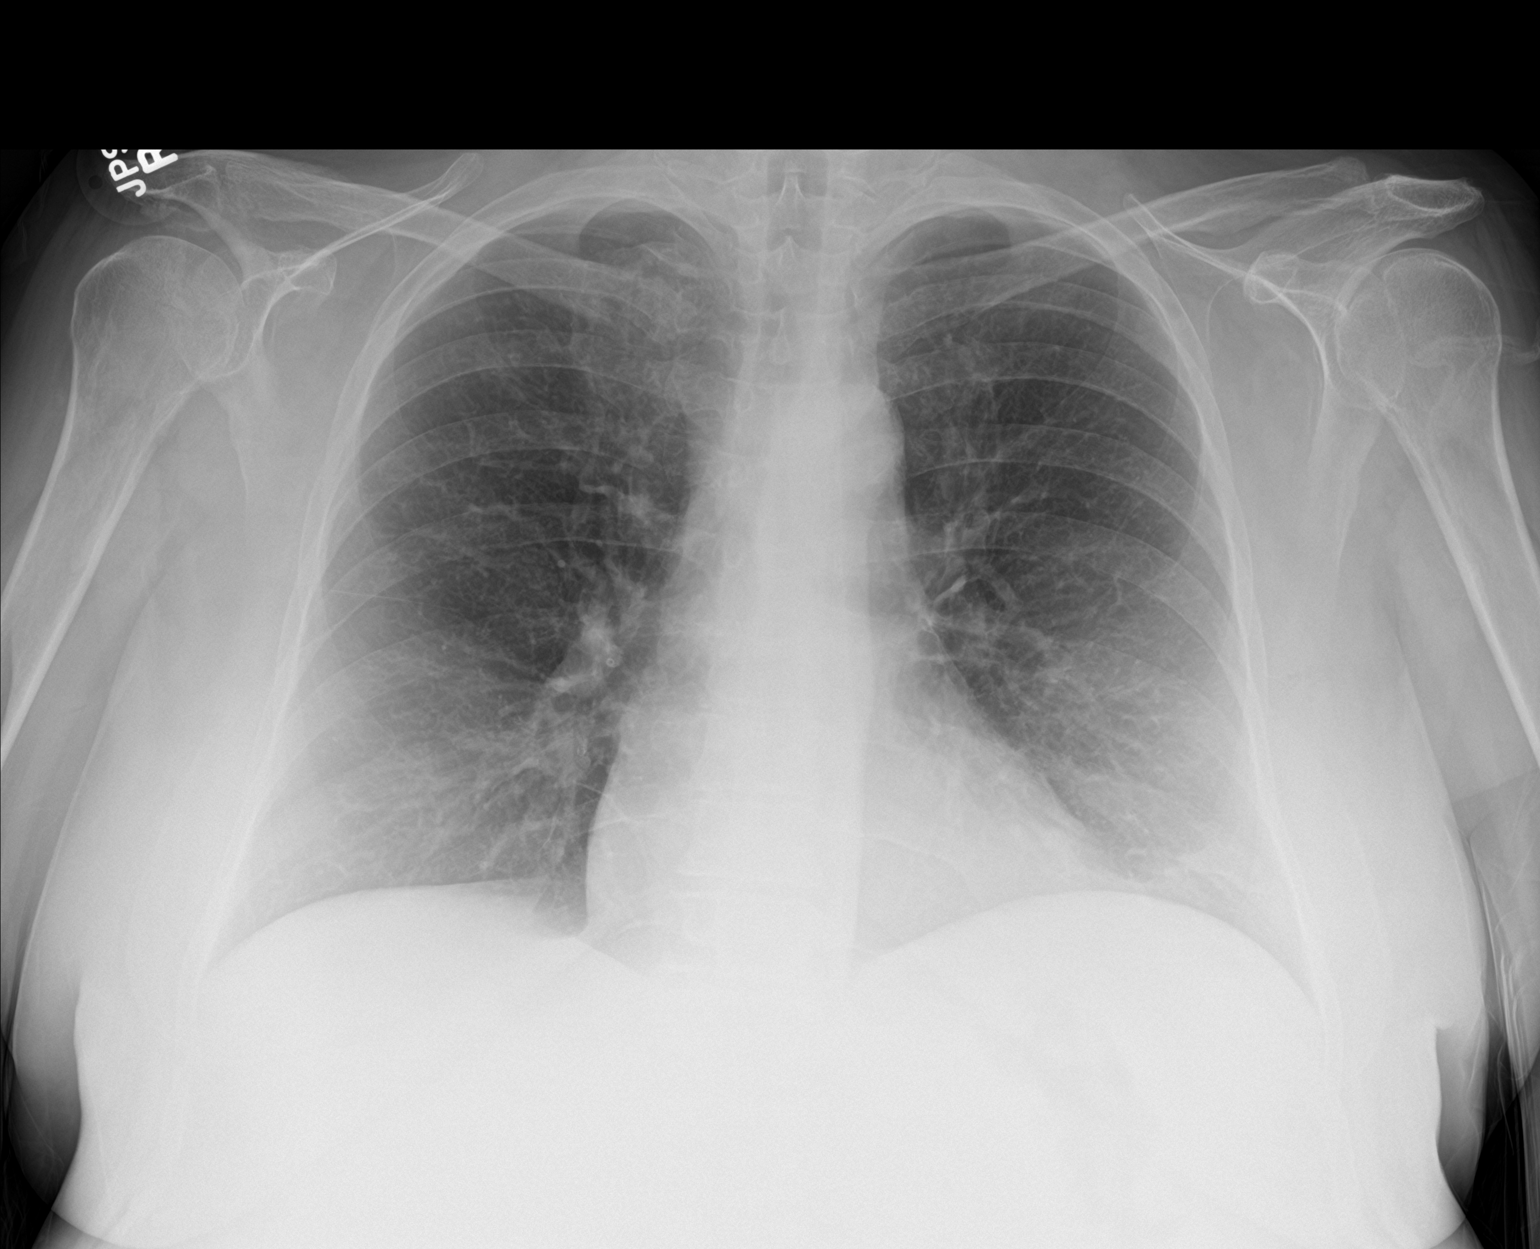

[2 of 2 positions shown; findings below may reference images not displayed]

FINDINGS: The heart size and mediastinal contours are within normal limits.
Both lungs are clear. The visualized skeletal structures are
unremarkable.
IMPRESSION: No active cardiopulmonary disease.

## 2018-11-04 ENCOUNTER — Emergency Department (HOSPITAL_COMMUNITY)
Admission: EM | Admit: 2018-11-04 | Discharge: 2018-11-04 | Disposition: A | Discharge status: Home / Self Care | Payer: Self-pay | Attending: Emergency Medicine | Admitting: Emergency Medicine

## 2018-11-04 DIAGNOSIS — J449 Chronic obstructive pulmonary disease, unspecified: Secondary | ICD-10-CM | POA: Insufficient documentation

## 2018-11-04 DIAGNOSIS — Y658 Other specified misadventures during surgical and medical care: Secondary | ICD-10-CM | POA: Insufficient documentation

## 2018-11-04 DIAGNOSIS — Z79899 Other long term (current) drug therapy: Secondary | ICD-10-CM | POA: Insufficient documentation

## 2018-11-04 DIAGNOSIS — F17210 Nicotine dependence, cigarettes, uncomplicated: Secondary | ICD-10-CM | POA: Insufficient documentation

## 2018-11-04 DIAGNOSIS — K7031 Alcoholic cirrhosis of liver with ascites: Secondary | ICD-10-CM | POA: Insufficient documentation

## 2018-11-04 IMAGING — US US PARACENTESIS
1 series · 4 of 4 positions shown · non-contrast
Comparison: none

INDICATION: Cirrhosis, recurrent ascites. Request made for therapeutic
paracentesis.

[Series 1: us paracentesis · 0.26mm/px · 4 of 4 slices shown]
[im 1/4]
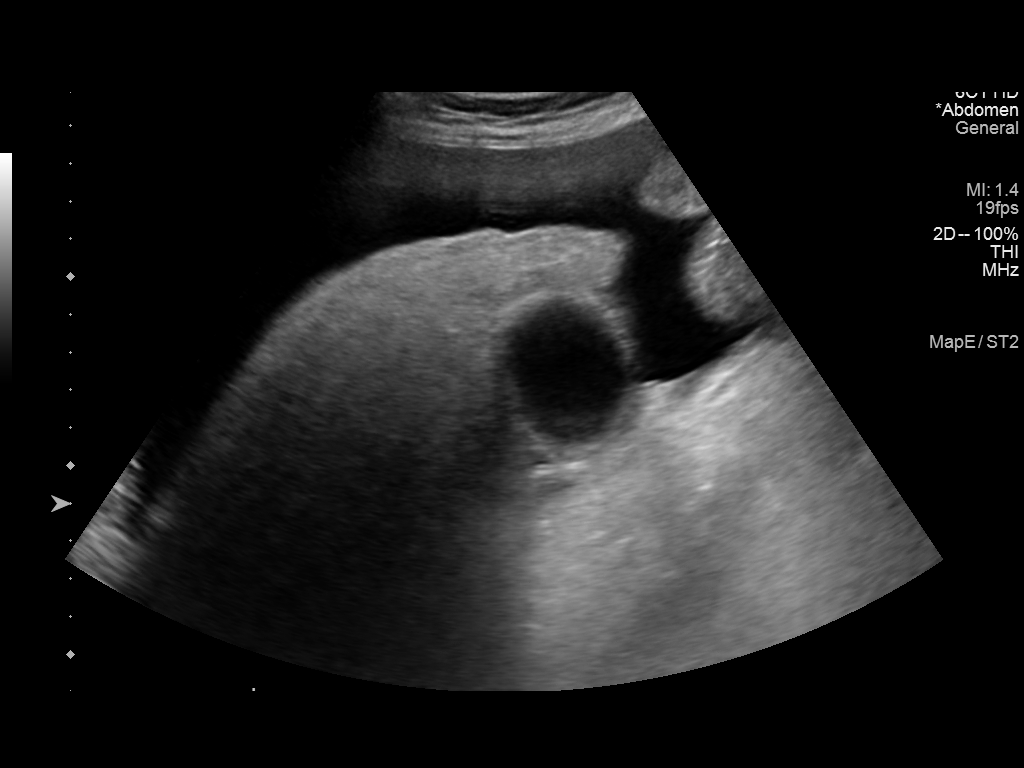
[im 2/4]
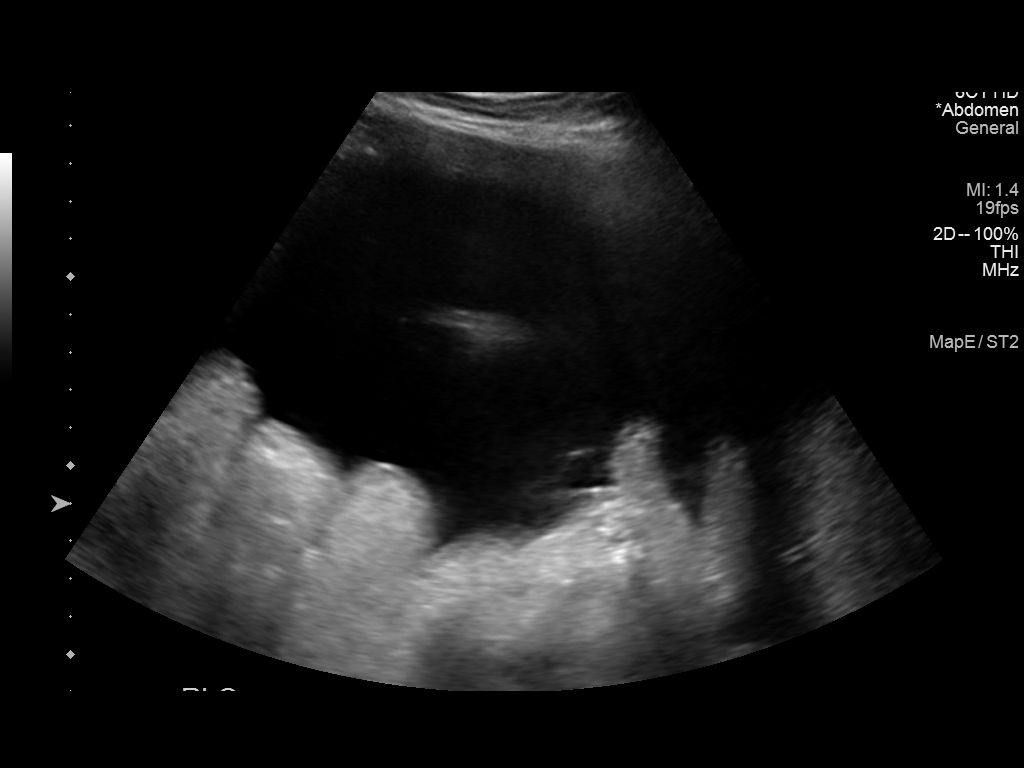
[im 3/4]
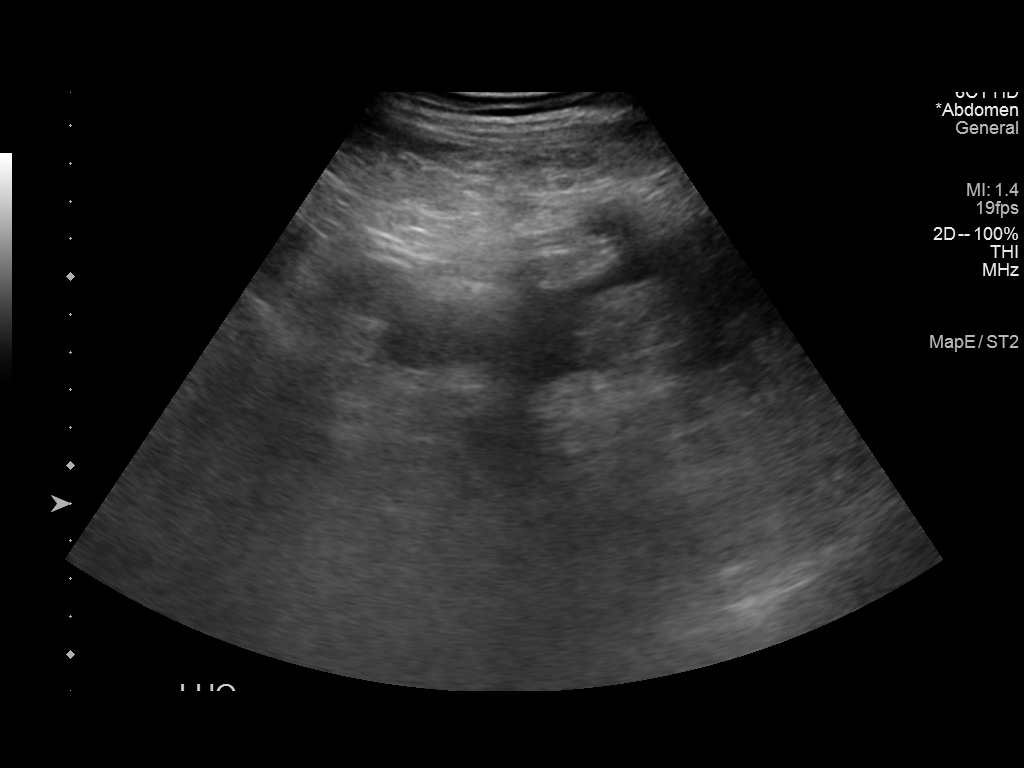
[im 4/4]
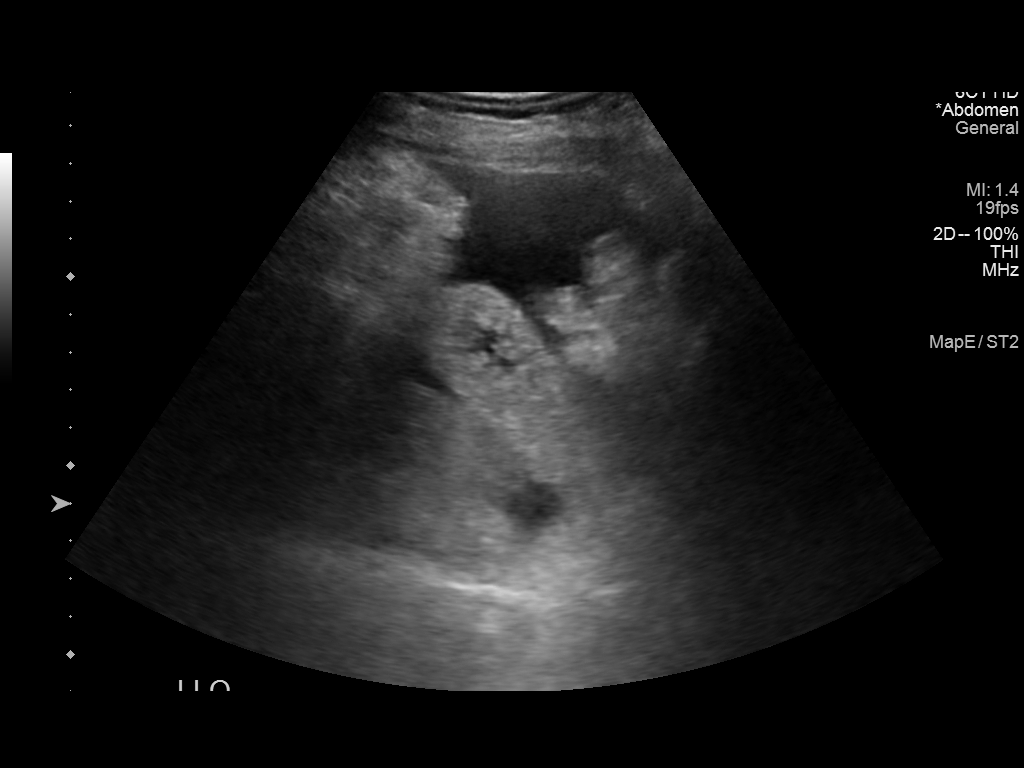

[4 of 4 positions shown; findings below may reference images not displayed]

EXAM:
ULTRASOUND GUIDED THERAPEUTIC PARACENTESIS

MEDICATIONS:
None.

COMPLICATIONS:
None immediate.

PROCEDURE:
Informed written consent was obtained from the patient after a
discussion of the risks, benefits and alternatives to treatment. A
timeout was performed prior to the initiation of the procedure.

Initial ultrasound scanning demonstrates a moderate-to-large amount
of ascites within the right lower abdominal quadrant. The right
lower abdomen was prepped and draped in the usual sterile fashion.
1% lidocaine was used for local anesthesia.

Following this, a Yueh catheter was introduced. An ultrasound image
was saved for documentation purposes. The paracentesis was
performed. The catheter was removed and a dressing was applied. The
patient tolerated the procedure well without immediate post
procedural complication.
FINDINGS: A total of approximately 4.4 liters of slightly hazy, yellow fluid
was removed.
IMPRESSION: Successful ultrasound-guided therapeutic paracentesis yielding
liters of peritoneal fluid.

## 2018-11-04 IMAGING — CT CT HEAD W/O CM
3 of 8 series · 13 of 47 positions shown, 15 images · non-contrast
Comparison: Brain and cervical spine MR examinations dated
08/02/2015.

CLINICAL DATA: Headache following a fall this morning.

EXAM:
CT HEAD WITHOUT CONTRAST
CT CERVICAL SPINE WITHOUT CONTRAST
TECHNIQUE: Multidetector CT imaging of the head and cervical spine was
performed following the standard protocol without intravenous
contrast. Multiplanar CT image reconstructions of the cervical spine
were also generated.

[Series 10: sagittal · sagittal · 0.34mm/px · 2 of 74 slices shown]
[im 25/74  brain]
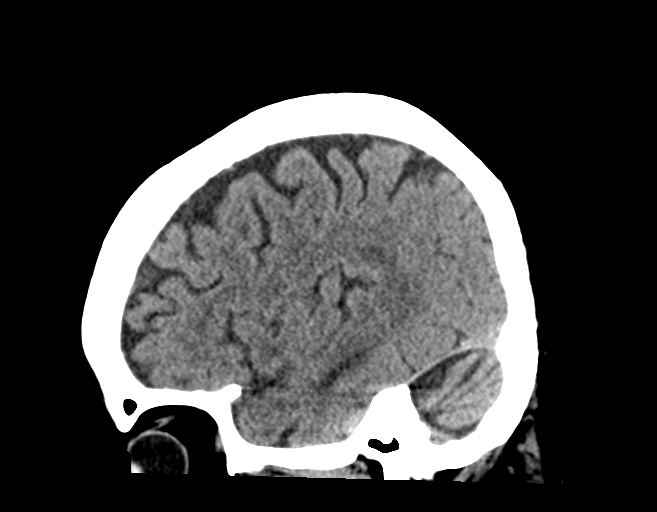
[im 49/74  brain]
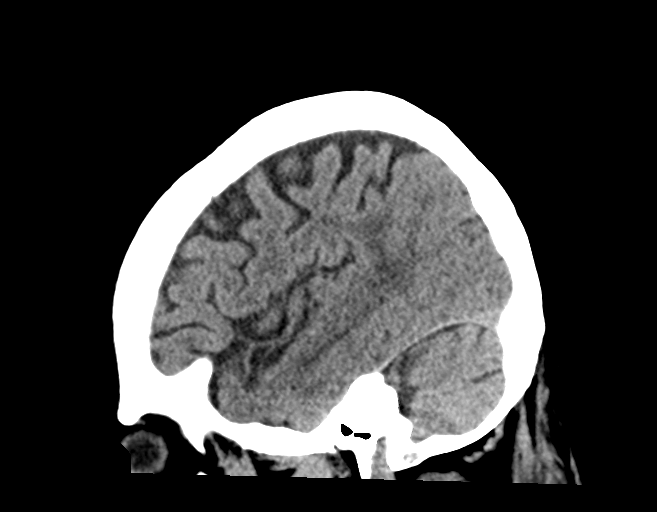

[Series 11: axial recon · axial · 0.23mm/px · z∈[+1279,+1449]mm · 8 of 112 slices shown, 10 images]
[im 11/112  brain]
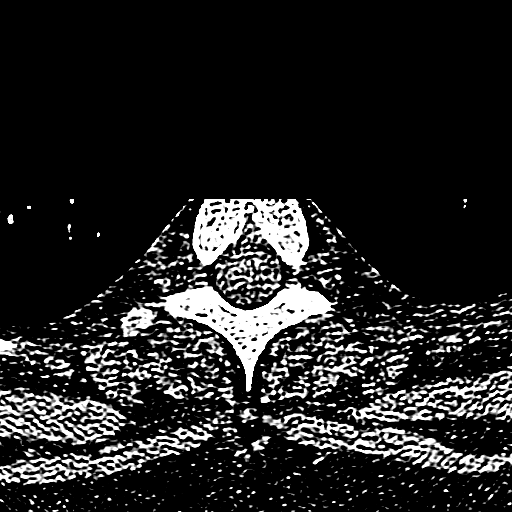
[im 11/112  bone]
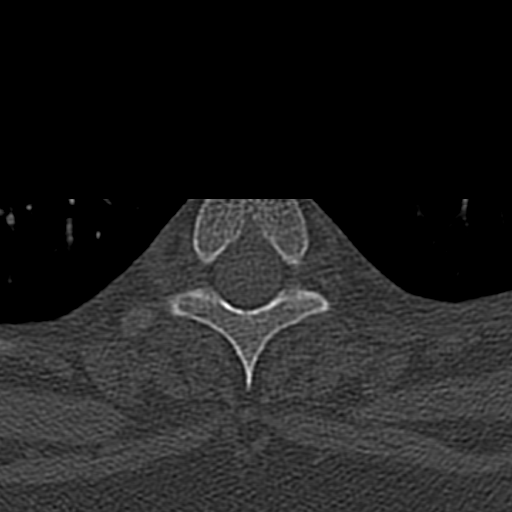
[im 21/112  brain]
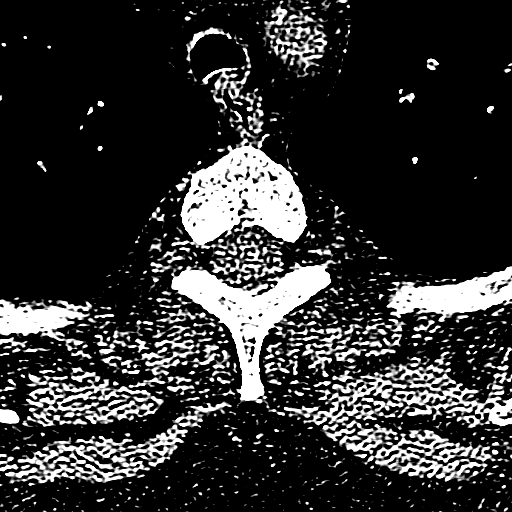
[im 41/112  brain]
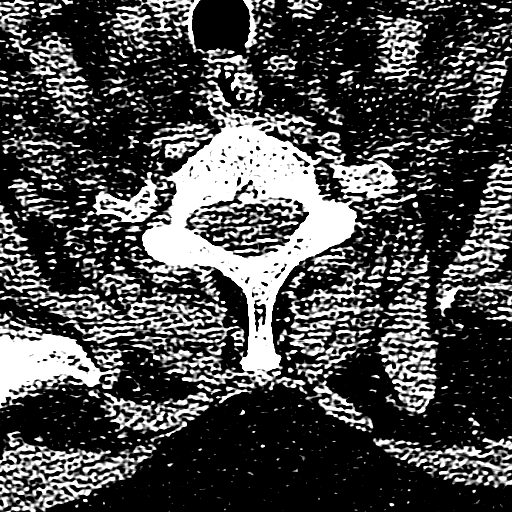
[im 51/112  brain]
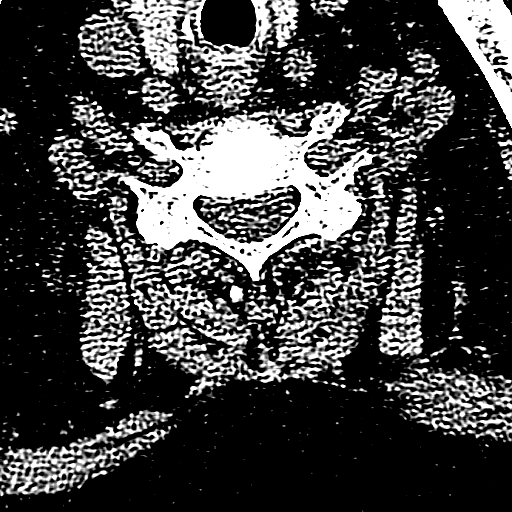
[im 61/112  brain]
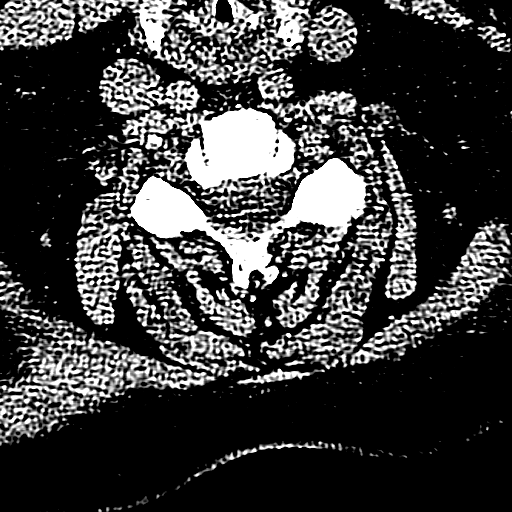
[im 61/112  bone]
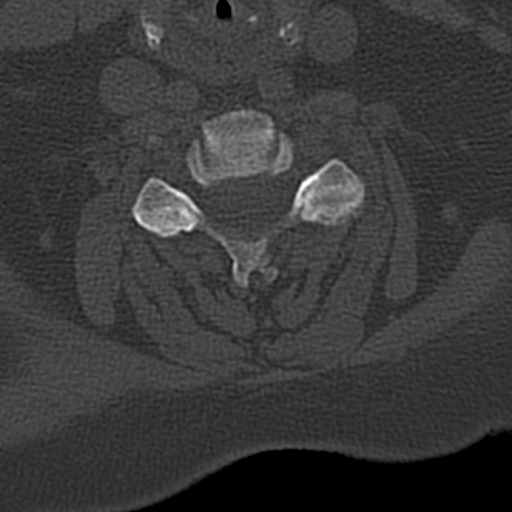
[im 71/112  brain]
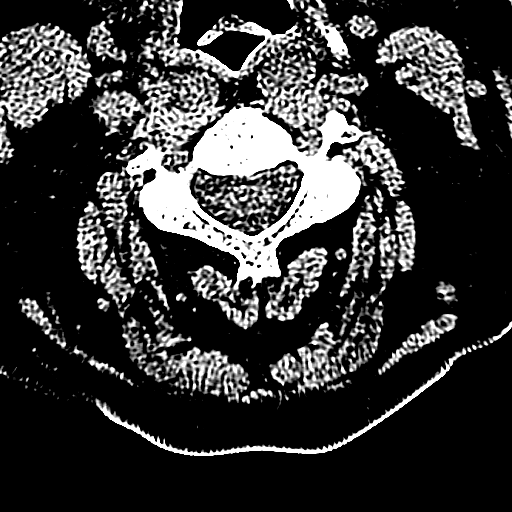
[im 91/112  brain]
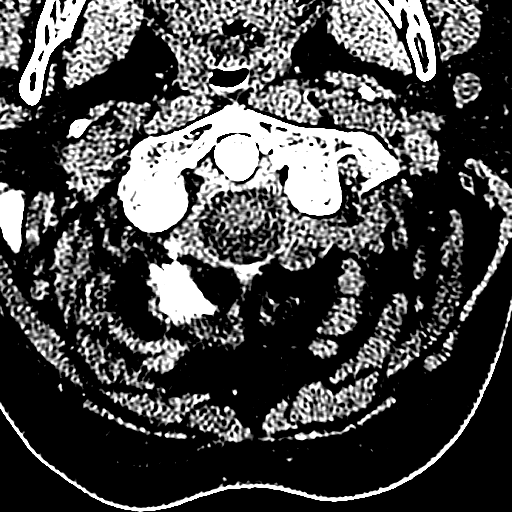
[im 101/112  brain]
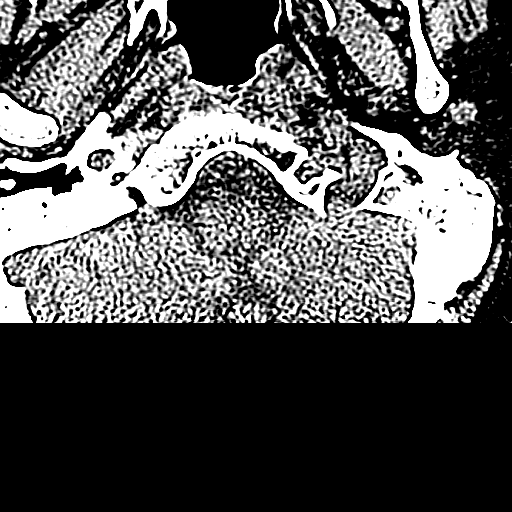

[Series 12: coronal · coronal · 0.26mm/px · 3 of 61 slices shown]
[im 18/61  brain]
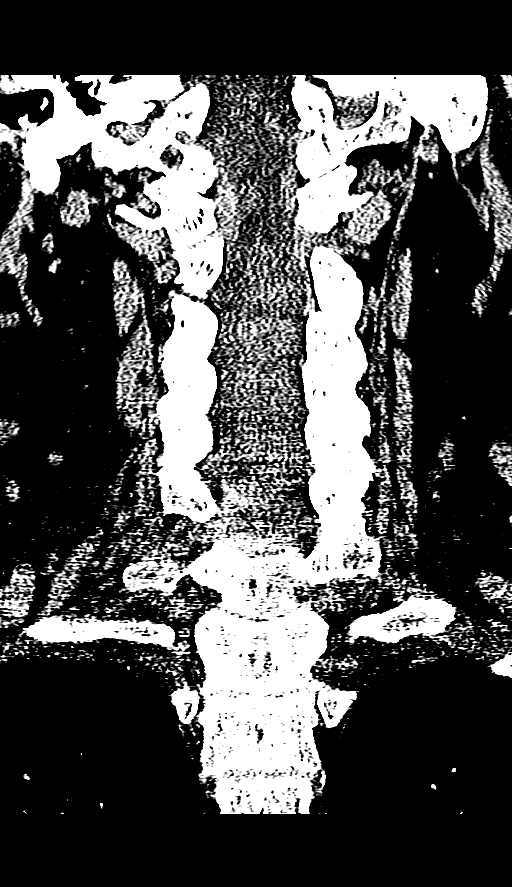
[im 26/61  brain]
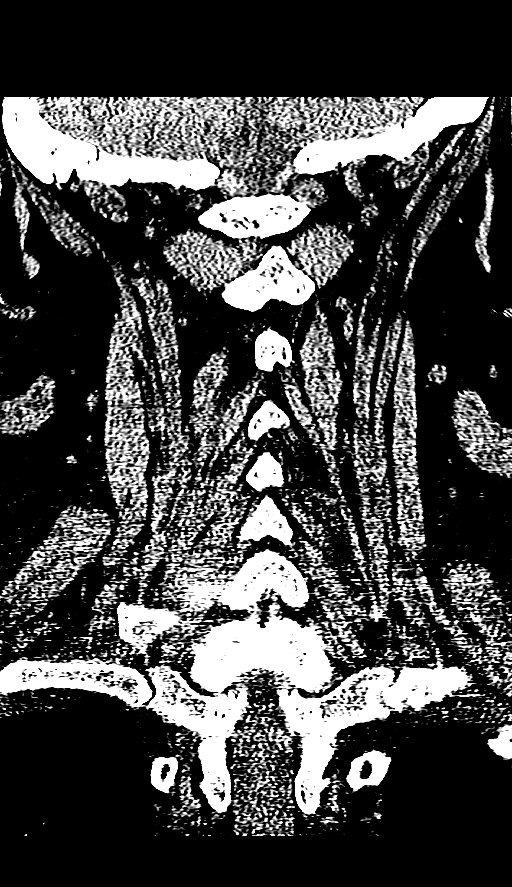
[im 35/61  brain]
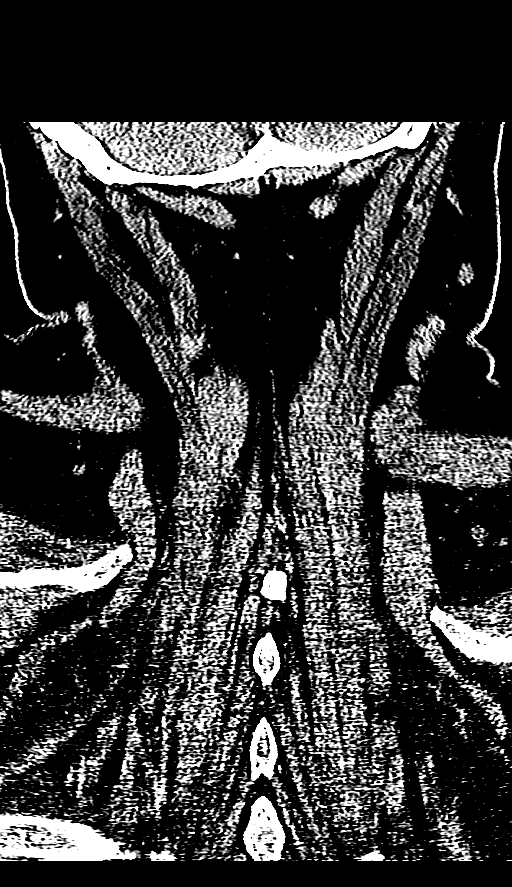

[13 of 47 positions shown; findings below may reference images not displayed]

FINDINGS: CT HEAD FINDINGS

Brain: Diffusely enlarged ventricles and subarachnoid spaces. Patchy
white matter low density in both cerebral hemispheres. No
intracranial hemorrhage, mass lesion or CT evidence of acute
infarction.

Vascular: No hyperdense vessel or unexpected calcification.

Skull: Normal. Negative for fracture or focal lesion.

Sinuses/Orbits: No acute finding.

Other: None.

CT CERVICAL SPINE FINDINGS

Alignment: Normal.

Skull base and vertebrae: No acute fracture. No primary bone lesion
or focal pathologic process.

Soft tissues and spinal canal: No prevertebral fluid or swelling. No
visible canal hematoma.

Disc levels:  Minimal degenerative changes at multiple levels.

Upper chest: Clear lung apices.

Other: None.
IMPRESSION: 1. No skull fracture or intracranial hemorrhage.
2. No cervical spine fracture or subluxation.
3. Stable atrophy and chronic small vessel white matter ischemic
changes in both cerebral hemispheres.
4. Minimal cervical spine degenerative changes.

## 2018-11-04 MED ORDER — CHLORDIAZEPOXIDE HCL 25 MG PO CAPS
25.0000 mg | ORAL_CAPSULE | Freq: Once | ORAL | Status: AC
  Administered 2018-11-04: 15:00:00 25 mg via ORAL
  Filled 2018-11-04: qty 1

## 2018-11-04 MED ORDER — CHLORDIAZEPOXIDE HCL 25 MG PO CAPS: ORAL_CAPSULE | ORAL | 0 refills | Status: AC

## 2018-11-04 NOTE — ED Provider Notes (Signed)
[facility] Black DEPT Provider Note   CSN: [id number] Arrival date & time: 11/04/18  1314     History   Chief Complaint Chief Complaint  Patient presents with  . Abdominal Pain    HPI Anne Black is a 58 y.o. female.     HPI  58 year old female comes in a chief complaint of drainage from abdominal site.  Patient states that over the last week she has had clear liquid oozing out of her surgical incision site over the abdomen.  The surgical incision site stems from previous hernia repair.  She denies any pain, fevers, chills, nausea, vomiting.  Of note, patient's guardianship has been awarded to the state.  I called the number reported as being of her spouse and there was no response.  Few minutes later Anne Black herself called me back stating that she received a voicemail to the number.  We called the home number as well and nobody picked up.   Patient reports that she was getting paracentesis in the past but she does not have any insurance right now and is in the process of getting new insurance.  She would not want ascites drained at this time.  Past Medical History:  Diagnosis Date  . ADHD (attention deficit hyperactivity disorder)   . Alcoholic cirrhosis ([place])    with ascites  . Alcoholism ([place])   . Allergies   . Anxiety   . Arthritis   . Atrial fibrillation with RVR ([place])   . Bipolar disorder ([place])   . COPD (chronic obstructive pulmonary disease) (HCC)    wears 2L chronic O2  . Dyspnea   . EtOH dependence ([place]) 05/14/2018  . GERD (gastroesophageal reflux disease)   . History of hiatal hernia   . Hypokalemia   . Migraine   . Multiple sclerosis ([place])   . Narcolepsy   . Osteoarthritis of knee   . Osteoporosis   . Pneumonia   . UTI (urinary tract infection) 08/11/2018    Patient Active Problem List   Diagnosis Date Noted  . ADHD (attention deficit hyperactivity disorder)   . Anemia 08/16/2018  . Palliative care by specialist    . Cellulitis of lower extremity   . Peripheral edema   . Atrial fibrillation with RVR ([place]) 06/13/2018  . Alcohol dependence ([place]) 05/06/2018  . Electrolyte disturbance 05/05/2018  . Alcohol withdrawal delirium ([place]) 04/28/2018  . Weakness generalized 04/27/2018  . Gait abnormality 04/24/2018  . Chronic hypoxemic respiratory failure ([place]) 04/11/2018  . Atrial fibrillation with rapid ventricular response ([place]) 03/23/2018  . Head injury   . Fall 03/19/2018  . Syncope 03/19/2018  . Orbital fracture 03/19/2018  . Scalp laceration 03/19/2018  . Acute respiratory failure with hypoxia and hypercarbia ([place]) 03/19/2018  . Abdominal tenderness 03/19/2018  . Acute on chronic respiratory failure with hypoxia ([place]) 03/17/2018  . Alcohol abuse with intoxication ([place]) 03/17/2018  . ETOH abuse   . Normocytic anemia 03/16/2018  . Acute metabolic encephalopathy 30/08/6224  . Lower extremity edema 03/16/2018  . Migraine 03/16/2018  . A-fib ([place]) 02/18/2018  . Community acquired pneumonia of left lower lobe of lung ([place])   . Weakness 08/21/2017  . COPD exacerbation ([place]) 06/12/2017  . Chest pain with moderate risk for cardiac etiology 05/30/2017  . Abnormal LFTs 05/30/2017  . Tachycardia 05/30/2017  . COPD (chronic obstructive pulmonary disease) ([place]) 04/19/2017  . Right rib fracture 04/15/2017  . Abdominal wall cellulitis 12/30/2016  . S/P exploratory laparotomy 12/30/2016  .  Distal radius fracture, left 09/09/2016  . Thrombocytopenia (HCC) 09/09/2016  . Chest x-ray abnormality   . Umbilical hernia without obstruction and without gangrene 05/12/2016  . Itching 02/24/2016  . Ventral hernia without obstruction or gangrene 02/24/2016  . Goals of care, counseling/discussion   . Ascites   . Tachypnea   . Atrial flutter with rapid ventricular response (HCC)   . Hypomagnesemia   . Hypoxia   . Dehydration 01/22/2016  . Low back ache 12/30/2015  . Non-intractable vomiting with nausea   .  Alcohol abuse with alcohol-induced mood disorder (HCC) 12/15/2015  . Acute alcoholism (HCC)   . Distended abdomen 11/04/2015  . Alcohol abuse   . Dyspnea   . Acute respiratory failure (HCC) 10/15/2015  . Ascites due to alcoholic cirrhosis (HCC) 10/15/2015  . Osteoporosis   . Multiple sclerosis (HCC)   . Alcoholic cirrhosis of liver with ascites (HCC) 08/22/2015  . Bipolar disorder, current episode mixed, moderate (HCC) 08/01/2015  . Alcohol use disorder, severe, dependence (HCC) 07/31/2015  . Macrocytic anemia- due to alcohol abuse with normal B12 & folate levels 05/31/2015  . Protein-calorie malnutrition, severe (HCC) 05/31/2015  . Hypokalemia 05/26/2015  . Alcohol withdrawal (HCC) 05/18/2015  . Abdominal pain 05/18/2015  . Anxiety 05/18/2015  . Stimulant abuse (HCC) 12/27/2014  . Nicotine abuse 12/27/2014  . Hyperprolactinemia (HCC) 11/15/2014  . Dyslipidemia   . Alcohol use disorder, moderate, dependence (HCC) 10/30/2014  . Tobacco abuse 01/23/2014  . Bipolar 1 disorder (HCC) 04/07/2013  . Noncompliance with therapeutic plan 04/04/2013  . Hereditary and idiopathic peripheral neuropathy 05/01/2012  . Depression 05/01/2012  . GERD (gastroesophageal reflux disease) 05/01/2012  . Osteoarthrosis, unspecified whether generalized or localized, involving lower leg 05/01/2012  . Hyponatremia 07/01/2011    Past Surgical History:  Procedure Laterality Date  . ABDOMINAL WALL DEFECT REPAIR N/A 12/30/2016   Procedure: EXPLORATORY LAPAROTOMY WITH REPAIR ABDOMINAL WALL VENTRAL HERNIA;  Surgeon: [name], MD;  Location: [facility] OR;  Service: General;  Laterality: N/A;  . APPLICATION OF WOUND VAC N/A 12/30/2016   Procedure: APPLICATION OF WOUND VAC;  Surgeon: [name], MD;  Location: [facility] OR;  Service: General;  Laterality: N/A;  . CESAREAN SECTION  [phone]  . FRACTURE SURGERY    . HERNIA REPAIR    . IR PARACENTESIS  06/14/2018  . IR PARACENTESIS  08/14/2018  . IR PARACENTESIS   08/18/2018  . MYRINGOTOMY WITH TUBE PLACEMENT Bilateral   . ORIF WRIST FRACTURE Left 09/09/2016   Procedure: OPEN REDUCTION INTERNAL FIXATION (ORIF) LEFT WRIST FRACTURE, LEFT CARPAL TUNNEL RELEASE;  Surgeon: Dominica Severin, MD;  Location: [facility] ORS;  Service: Orthopedics;  Laterality: Left;  . TONSILLECTOMY       OB History   No obstetric history on file.      Home Medications    Prior to Admission medications   Medication Sig Start Date End Date Taking? Authorizing Provider  albuterol (VENTOLIN HFA) 108 (90 Base) MCG/ACT inhaler Inhale 1-2 puffs into the lungs every 6 (six) hours as needed for wheezing or shortness of breath.    [provider]  Amino Acids-Protein Hydrolys (FEEDING SUPPLEMENT, PRO-STAT SUGAR FREE 64,) LIQD Take 30 mLs by mouth 2 (two) times daily. 09/12/18   Kathlen Mody, MD  folic acid (FOLVITE) 1 MG tablet Take 1 tablet (1 mg total) by mouth daily. 09/12/18   Kathlen Mody, MD  furosemide (LASIX) 20 MG tablet Take 10 mg by mouth daily.    [provider]  furosemide (LASIX)  40 MG tablet Take 1 tablet (40 mg total) by mouth daily. Patient not taking: Reported on 09/08/2018 06/27/18   Sabas SousBero, Michael M, MD  gabapentin (NEURONTIN) 100 MG capsule Take 2 capsules (200 mg total) by mouth 3 (three) times daily for 30 days. 07/19/18 10/16/18  Fayrene Helperran, Bowie, PA-C  gabapentin (NEURONTIN) 300 MG capsule Take 300 mg by mouth every 8 (eight) hours.    [provider]  hydrocortisone cream (PREPARATION H) 1 % Apply 1 application topically 3 (three) times daily as needed for itching.    [provider]  hydrOXYzine (ATARAX/VISTARIL) 25 MG tablet Take 1 tablet (25 mg total) by mouth daily as needed for up to 30 doses (anxiety). 04/14/18   Tegeler, Canary Brimhristopher J, MD  ipratropium-albuterol (DUONEB) 0.5-2.5 (3) MG/3ML SOLN Take 3 mLs by nebulization every 6 (six) hours as needed (wheezing, SOB).    [provider]  lactulose (CHRONULAC) 10 GM/15ML solution  Take 30 mLs (20 g total) by mouth 2 (two) times daily. Patient not taking: Reported on 09/08/2018 08/20/18   Standley BrookingGoodrich, Daniel P, MD  LORazepam (ATIVAN) 0.5 MG tablet Take 1 tablet (0.5 mg total) by mouth every 12 (twelve) hours as needed for anxiety. 08/20/18   Standley BrookingGoodrich, Daniel P, MD  Multiple Vitamin (MULTIVITAMIN WITH MINERALS) TABS tablet Take 1 tablet by mouth daily. 08/21/18   Standley BrookingGoodrich, Daniel P, MD  OLANZapine (ZYPREXA) 5 MG tablet Take 1 tablet (5 mg total) by mouth every 8 (eight) hours as needed (agitation). 10/09/18   Tegeler, Canary Brimhristopher J, MD  ondansetron (ZOFRAN ODT) 8 MG disintegrating tablet Take 1 tablet (8 mg total) by mouth every 8 (eight) hours as needed for nausea or vomiting. 09/05/18   Azalia Bilisampos, Kevin, MD  OXYGEN Inhale 2 L into the lungs as needed (to maintain levels).    [provider]  pantoprazole (PROTONIX) 20 MG tablet Take 2 tablets (40 mg total) by mouth daily. 07/29/18   Mesner, Barbara CowerJason, MD  potassium chloride SA (K-DUR,KLOR-CON) 20 MEQ tablet Take 2 tablets (40 mEq total) by mouth 2 (two) times daily. 04/19/18   [place]hel BornGekas, Kelly Marie, PA-C  spironolactone (ALDACTONE) 25 MG tablet Take 1 tablet (25 mg total) by mouth daily for 30 days. 06/27/18 10/16/18  Sabas SousBero, Michael M, MD  thiamine 100 MG tablet Take 1 tablet (100 mg total) by mouth daily. 08/21/18   Standley BrookingGoodrich, Daniel P, MD    Family History Family History  Problem Relation Age of Onset  . Arrhythmia Mother   . Neuropathy Mother   . Coronary artery disease Mother   . Heart disease Mother   . Heart disease Father   . Hypertension Father   . Heart attack Father   . Multiple sclerosis Maternal Aunt     Social History Social History   Tobacco Use  . Smoking status: Current Every Day Smoker    Packs/day: 1.00    Years: 36.00    Pack years: 36.00    Types: Cigarettes  . Smokeless tobacco: Never Used  Substance Use Topics  . Alcohol use: Yes    Comment: Chronic ETOH abuse  . Drug use: No    Comment: Pt denied;  UDS NA     Allergies   Patient has no known allergies.   Review of Systems Review of Systems  Constitutional: Positive for activity change.  Gastrointestinal: Positive for abdominal distention.  Skin: Positive for rash.  Allergic/Immunologic: Positive for immunocompromised state.     Physical Exam Updated Vital Signs BP 129/65  Pulse 84   Temp 98.2 F (36.8 C) (Oral)   Resp 19   Ht 5\' 2"  (1.575 m)   Wt 75.9 kg   SpO2 100%   BMI 30.60 kg/m   Physical Exam Vitals signs and nursing note reviewed.  Cardiovascular:     Rate and Rhythm: Normal rate.  Abdominal:     General: There is distension.     Tenderness: There is no abdominal tenderness.     Comments: Infraumbilical vertical incision wound.  There is crusting over the top of the wound site.  No evidence of seroma, induration, erythema, warmth to touch.  There is some clear fluid noted on the dressing that patient had applied.  Neurological:     Mental Status: She is alert.      ED Treatments / Results  Labs (all labs ordered are listed, but only abnormal results are displayed) Labs Reviewed - No data to display  EKG None  Radiology No results found.  Procedures Procedures (including critical care time)  Medications Ordered in ED Medications - No data to display   Initial Impression / Assessment and Plan / ED Course  I have reviewed the triage vital signs and the nursing notes.  Pertinent labs & imaging results that were available during my care of the patient were reviewed by me and considered in my medical decision making (see chart for details).        58 year old with alcoholic liver cirrhosis comes in a chief complaint of clear fluid drainage from her abdomen and abdominal distention.  She has known ascites.  I reviewed the CT scan from September 7 and also from August.  She did not have any seroma, abscess at that time.  Clinical exam is not consistent with infectious process or even a  seroma right now.  I think the fluid draining is her ascitic fluid.  Patient has declined paracentesis.  There is no indication for emergent paracentesis therefore despite patient's guardianship has been awarded to the state, I do not think we need to use that authority to force her into paracentesis.  She states that she is trying to get insurance squared up and then will get her paracentesis done.  We have applied Steri-Strip dressing to the wound. She is stable for discharge.  I have called all the phone numbers listed in patient's record. If you are unable to get in touch with her ex-husband then we will discharge her via [facility].  Final Clinical Impressions(s) / ED Diagnoses   Final diagnoses:  Ascites due to alcoholic cirrhosis [facility](HCC)    ED Discharge Orders    None       Derwood KaplanNanavati, Zuhayr Deeney, MD 11/04/18 1415

## 2018-11-04 NOTE — ED Notes (Signed)
Unable to complete full triage due to pt refusing to answer all questions.

## 2018-11-04 NOTE — Discharge Instructions (Addendum)
The fluid oozing from your abdominal wound site is likely ascites.  We would like you to call your GI doctor and get the paracentesis completed at the earliest opportunity you get. Librium has been prescribed at your request for alcohol withdrawal.

## 2018-11-04 NOTE — ED Triage Notes (Signed)
Pt arrives to ED via [facility] coming from home. Pt c/o abdominal pain but would not allow EMS to perform any assessment. Pt was ambulatory to room with EDP at bedside during triage.

## 2018-11-10 ENCOUNTER — Emergency Department (HOSPITAL_COMMUNITY): Admission: EM | Admit: 2018-11-10 | Discharge: 2018-11-10 | Discharge status: Against Medical Advice | Payer: Self-pay

## 2018-11-10 NOTE — ED Notes (Signed)
Pt refuses triage. Wants to go to the lobby and leave.

## 2018-11-13 ENCOUNTER — Encounter (HOSPITAL_COMMUNITY): Payer: Self-pay | Admitting: Emergency Medicine

## 2018-11-13 ENCOUNTER — Emergency Department (HOSPITAL_COMMUNITY)
Admission: EM | Admit: 2018-11-13 | Discharge: 2018-11-13 | Disposition: A | Discharge status: Home / Self Care | Payer: Self-pay | Attending: Emergency Medicine | Admitting: Emergency Medicine

## 2018-11-13 DIAGNOSIS — R69 Illness, unspecified: Secondary | ICD-10-CM

## 2018-11-13 DIAGNOSIS — Z9181 History of falling: Secondary | ICD-10-CM | POA: Insufficient documentation

## 2018-11-13 DIAGNOSIS — R0902 Hypoxemia: Secondary | ICD-10-CM | POA: Insufficient documentation

## 2018-11-13 DIAGNOSIS — J449 Chronic obstructive pulmonary disease, unspecified: Secondary | ICD-10-CM | POA: Insufficient documentation

## 2018-11-13 DIAGNOSIS — E785 Hyperlipidemia, unspecified: Secondary | ICD-10-CM | POA: Insufficient documentation

## 2018-11-13 DIAGNOSIS — G35 Multiple sclerosis: Secondary | ICD-10-CM | POA: Insufficient documentation

## 2018-11-13 DIAGNOSIS — K6289 Other specified diseases of anus and rectum: Secondary | ICD-10-CM | POA: Insufficient documentation

## 2018-11-13 DIAGNOSIS — I4891 Unspecified atrial fibrillation: Secondary | ICD-10-CM | POA: Insufficient documentation

## 2018-11-13 DIAGNOSIS — F17210 Nicotine dependence, cigarettes, uncomplicated: Secondary | ICD-10-CM | POA: Insufficient documentation

## 2018-11-13 DIAGNOSIS — W19XXXA Unspecified fall, initial encounter: Secondary | ICD-10-CM

## 2018-11-13 DIAGNOSIS — Z79899 Other long term (current) drug therapy: Secondary | ICD-10-CM | POA: Insufficient documentation

## 2018-11-13 NOTE — ED Triage Notes (Signed)
Pt brought in by [facility] psych eval. Pt was off O2 but dropped to 85% per [facility] so put back on O2 2L. BP 134/78, 98HR, temp 97.2. R 30.

## 2018-11-13 NOTE — ED Notes (Signed)
Voicemail left for guardian .

## 2018-11-13 NOTE — ED Triage Notes (Signed)
Pt reports she fell earlier today and her tail hurts.

## 2018-11-13 NOTE — ED Provider Notes (Signed)
[facility]-EMERGENCY DEPT Provider Note   CSN: [id number] Arrival date & time: 11/13/18  1218     History   Chief Complaint Chief Complaint  Patient presents with  . Psychiatric Evaluation  . Fall  . Rectal Pain    HPI Anne Black is a 58 y.o. female.     HPI Patient reports that she fell onto her buttocks earlier today.  She reports that her bottom hurts.  No other associated complaints are reported injury.  Patient has chronic abdominal distention and ascites.  No acute complaints.  Patient is not reporting any suicidal ideation. Past Medical History:  Diagnosis Date  . ADHD (attention deficit hyperactivity disorder)   . Alcoholic cirrhosis (HCC)    with ascites  . Alcoholism (HCC)   . Allergies   . Anxiety   . Arthritis   . Atrial fibrillation with RVR (HCC)   . Bipolar disorder (HCC)   . COPD (chronic obstructive pulmonary disease) (HCC)    wears 2L chronic O2  . Dyspnea   . EtOH dependence (HCC) 05/14/2018  . GERD (gastroesophageal reflux disease)   . History of hiatal hernia   . Hypokalemia   . Migraine   . Multiple sclerosis (HCC)   . Narcolepsy   . Osteoarthritis of knee   . Osteoporosis   . Pneumonia   . UTI (urinary tract infection) 08/11/2018    Patient Active Problem List   Diagnosis Date Noted  . ADHD (attention deficit hyperactivity disorder)   . Anemia 08/16/2018  . Palliative care by specialist   . Cellulitis of lower extremity   . Peripheral edema   . Atrial fibrillation with RVR (HCC) 06/13/2018  . Alcohol dependence (HCC) 05/06/2018  . Electrolyte disturbance 05/05/2018  . Alcohol withdrawal delirium (HCC) 04/28/2018  . Weakness generalized 04/27/2018  . Gait abnormality 04/24/2018  . Chronic hypoxemic respiratory failure (HCC) 04/11/2018  . Atrial fibrillation with rapid ventricular response (HCC) 03/23/2018  . Head injury   . Fall 03/19/2018  . Syncope 03/19/2018  . Orbital fracture (HCC) 03/19/2018  .  Scalp laceration 03/19/2018  . Acute respiratory failure with hypoxia and hypercarbia (HCC) 03/19/2018  . Abdominal tenderness 03/19/2018  . Acute on chronic respiratory failure with hypoxia (HCC) 03/17/2018  . Alcohol abuse with intoxication (HCC) 03/17/2018  . ETOH abuse   . Normocytic anemia 03/16/2018  . Acute metabolic encephalopathy 03/16/2018  . Lower extremity edema 03/16/2018  . Migraine 03/16/2018  . A-fib (HCC) 02/18/2018  . Community acquired pneumonia of left lower lobe of lung   . Weakness 08/21/2017  . COPD exacerbation (HCC) 06/12/2017  . Chest pain with moderate risk for cardiac etiology 05/30/2017  . Abnormal LFTs 05/30/2017  . Tachycardia 05/30/2017  . COPD (chronic obstructive pulmonary disease) (HCC) 04/19/2017  . Right rib fracture 04/15/2017  . Abdominal wall cellulitis 12/30/2016  . S/P exploratory laparotomy 12/30/2016  . Distal radius fracture, left 09/09/2016  . Thrombocytopenia (HCC) 09/09/2016  . Chest x-ray abnormality   . Umbilical hernia without obstruction and without gangrene 05/12/2016  . Itching 02/24/2016  . Ventral hernia without obstruction or gangrene 02/24/2016  . Goals of care, counseling/discussion   . Ascites   . Tachypnea   . Atrial flutter with rapid ventricular response (HCC)   . Hypomagnesemia   . Hypoxia   . Dehydration 01/22/2016  . Low back ache 12/30/2015  . Non-intractable vomiting with nausea   . Alcohol abuse with alcohol-induced mood disorder (HCC) 12/15/2015  . Acute  alcoholism (HCC)   . Distended abdomen 11/04/2015  . Alcohol abuse   . Dyspnea   . Acute respiratory failure (HCC) 10/15/2015  . Ascites due to alcoholic cirrhosis (HCC) 10/15/2015  . Osteoporosis   . Multiple sclerosis (HCC)   . Alcoholic cirrhosis of liver with ascites (HCC) 08/22/2015  . Bipolar disorder, current episode mixed, moderate (HCC) 08/01/2015  . Alcohol use disorder, severe, dependence (HCC) 07/31/2015  . Macrocytic anemia- due to  alcohol abuse with normal B12 & folate levels 05/31/2015  . Protein-calorie malnutrition, severe (HCC) 05/31/2015  . Hypokalemia 05/26/2015  . Alcohol withdrawal (HCC) 05/18/2015  . Abdominal pain 05/18/2015  . Anxiety 05/18/2015  . Stimulant abuse (HCC) 12/27/2014  . Nicotine abuse 12/27/2014  . Hyperprolactinemia (HCC) 11/15/2014  . Dyslipidemia   . Alcohol use disorder, moderate, dependence (HCC) 10/30/2014  . Tobacco abuse 01/23/2014  . Bipolar 1 disorder (HCC) 04/07/2013  . Noncompliance with therapeutic plan 04/04/2013  . Hereditary and idiopathic peripheral neuropathy 05/01/2012  . Depression 05/01/2012  . GERD (gastroesophageal reflux disease) 05/01/2012  . Osteoarthrosis, unspecified whether generalized or localized, involving lower leg 05/01/2012  . Hyponatremia 07/01/2011    Past Surgical History:  Procedure Laterality Date  . ABDOMINAL WALL DEFECT REPAIR N/A 12/30/2016   Procedure: EXPLORATORY LAPAROTOMY WITH REPAIR ABDOMINAL WALL VENTRAL HERNIA;  Surgeon: [name], MD;  Location: [facility] OR;  Service: General;  Laterality: N/A;  . APPLICATION OF WOUND VAC N/A 12/30/2016   Procedure: APPLICATION OF WOUND VAC;  Surgeon: [name], MD;  Location: [facility] OR;  Service: General;  Laterality: N/A;  . CESAREAN SECTION  [phone]  . FRACTURE SURGERY    . HERNIA REPAIR    . IR PARACENTESIS  06/14/2018  . IR PARACENTESIS  08/14/2018  . IR PARACENTESIS  08/18/2018  . MYRINGOTOMY WITH TUBE PLACEMENT Bilateral   . ORIF WRIST FRACTURE Left 09/09/2016   Procedure: OPEN REDUCTION INTERNAL FIXATION (ORIF) LEFT WRIST FRACTURE, LEFT CARPAL TUNNEL RELEASE;  Surgeon: Dominica Severin, MD;  Location: [facility] ORS;  Service: Orthopedics;  Laterality: Left;  . TONSILLECTOMY       OB History   No obstetric history on file.      Home Medications    Prior to Admission medications   Medication Sig Start Date End Date Taking? Authorizing Provider  albuterol (VENTOLIN HFA) 108 (90 Base)  MCG/ACT inhaler Inhale 1-2 puffs into the lungs every 6 (six) hours as needed for wheezing or shortness of breath.    [provider]  Amino Acids-Protein Hydrolys (FEEDING SUPPLEMENT, PRO-STAT SUGAR FREE 64,) LIQD Take 30 mLs by mouth 2 (two) times daily. 09/12/18   Kathlen Mody, MD  chlordiazePOXIDE (LIBRIUM) 25 MG capsule 50mg  PO TID x 1D, then 25-50mg  PO BID X 1D, then 25-50mg  PO QD X 1D 11/04/18   11/06/18, MD  folic acid (FOLVITE) 1 MG tablet Take 1 tablet (1 mg total) by mouth daily. 09/12/18   11/12/18, MD  furosemide (LASIX) 20 MG tablet Take 10 mg by mouth daily.    [provider]  furosemide (LASIX) 40 MG tablet Take 1 tablet (40 mg total) by mouth daily. Patient not taking: Reported on 09/08/2018 06/27/18   06/29/18, MD  gabapentin (NEURONTIN) 100 MG capsule Take 2 capsules (200 mg total) by mouth 3 (three) times daily for 30 days. 07/19/18 10/16/18  12/16/18, PA-C  gabapentin (NEURONTIN) 300 MG capsule Take 300 mg by mouth every 8 (eight) hours.    [provider]  hydrocortisone cream (PREPARATION H) 1 % Apply 1 application topically 3 (three) times daily as needed for itching.    [provider]  hydrOXYzine (ATARAX/VISTARIL) 25 MG tablet Take 1 tablet (25 mg total) by mouth daily as needed for up to 30 doses (anxiety). 04/14/18   Tegeler, Canary Brimhristopher J, MD  ipratropium-albuterol (DUONEB) 0.5-2.5 (3) MG/3ML SOLN Take 3 mLs by nebulization every 6 (six) hours as needed (wheezing, SOB).    [provider]  lactulose (CHRONULAC) 10 GM/15ML solution Take 30 mLs (20 g total) by mouth 2 (two) times daily. Patient not taking: Reported on 09/08/2018 08/20/18   Standley BrookingGoodrich, Daniel P, MD  LORazepam (ATIVAN) 0.5 MG tablet Take 1 tablet (0.5 mg total) by mouth every 12 (twelve) hours as needed for anxiety. 08/20/18   Standley BrookingGoodrich, Daniel P, MD  Multiple Vitamin (MULTIVITAMIN WITH MINERALS) TABS tablet Take 1 tablet by mouth daily. 08/21/18   Standley BrookingGoodrich,  Daniel P, MD  OLANZapine (ZYPREXA) 5 MG tablet Take 1 tablet (5 mg total) by mouth every 8 (eight) hours as needed (agitation). 10/09/18   Tegeler, Canary Brimhristopher J, MD  ondansetron (ZOFRAN ODT) 8 MG disintegrating tablet Take 1 tablet (8 mg total) by mouth every 8 (eight) hours as needed for nausea or vomiting. 09/05/18   Azalia Bilisampos, Kevin, MD  OXYGEN Inhale 2 L into the lungs as needed (to maintain levels).    [provider]  pantoprazole (PROTONIX) 20 MG tablet Take 2 tablets (40 mg total) by mouth daily. 07/29/18   Mesner, Barbara CowerJason, MD  potassium chloride SA (K-DUR,KLOR-CON) 20 MEQ tablet Take 2 tablets (40 mEq total) by mouth 2 (two) times daily. 04/19/18   Bethel BornGekas, Kelly Marie, PA-C  spironolactone (ALDACTONE) 25 MG tablet Take 1 tablet (25 mg total) by mouth daily for 30 days. 06/27/18 10/16/18  Sabas SousBero, Michael M, MD  thiamine 100 MG tablet Take 1 tablet (100 mg total) by mouth daily. 08/21/18   Standley BrookingGoodrich, Daniel P, MD    Family History Family History  Problem Relation Age of Onset  . Arrhythmia Mother   . Neuropathy Mother   . Coronary artery disease Mother   . Heart disease Mother   . Heart disease Father   . Hypertension Father   . Heart attack Father   . Multiple sclerosis Maternal Aunt     Social History Social History   Tobacco Use  . Smoking status: Current Every Day Smoker    Packs/day: 1.00    Years: 36.00    Pack years: 36.00    Types: Cigarettes  . Smokeless tobacco: Never Used  Substance Use Topics  . Alcohol use: Yes    Comment: Chronic ETOH abuse  . Drug use: No    Comment: Pt denied; UDS NA     Allergies   Patient has no known allergies.   Review of Systems Review of Systems Constitutional: No fever no chills no malaise Respiratory: No shortness of breath or cough.  Physical Exam Updated Vital Signs BP 123/67   Pulse 93   Temp 98.3 F (36.8 C) (Oral)   Resp 15   SpO2 95%   Physical Exam Constitutional:      Comments: Patient is chronically  deconditioned in appearance.  She has central obesity.  Poorly groomed and maintained.  HENT:     Head: Normocephalic and atraumatic.     Mouth/Throat:     Pharynx: Oropharynx is clear.  Eyes:     Extraocular Movements: Extraocular movements intact.     Pupils: Pupils  are equal, round, and reactive to light.  Neck:     Musculoskeletal: Neck supple.  Cardiovascular:     Rate and Rhythm: Normal rate.  Pulmonary:     Effort: Pulmonary effort is normal.     Breath sounds: Normal breath sounds.  Abdominal:     Comments: Abdomen is moderately distended.  Nontender.  Musculoskeletal:     Comments: 2+ symmetric edema.  Feet are very dirty and poorly maintained.  No acute wounds or deformities.  Skin:    General: Skin is warm and dry.  Neurological:     Comments: Patient is oriented to situation.  She is answering questions.  No focal motor deficits.      ED Treatments / Results  Labs (all labs ordered are listed, but only abnormal results are displayed) Labs Reviewed - No data to display  EKG None  Radiology No results found.  Procedures Procedures (including critical care time)  Medications Ordered in ED Medications - No data to display   Initial Impression / Assessment and Plan / ED Course  I have reviewed the triage vital signs and the nursing notes.  Pertinent labs & imaging results that were available during my care of the patient were reviewed by me and considered in my medical decision making (see chart for details).       Patient presents with report of a fall onto her buttocks.  No apparent fractures.  Patient has chronic deconditioning with chronic abdominal distention and ascites.  At this time no acute findings.  Will return to home with recommended outpatient follow-up.  Final Clinical Impressions(s) / ED Diagnoses   Final diagnoses:  Fall, initial encounter  Severe comorbid illness    ED Discharge Orders    None       Charlesetta Shanks, MD  11/13/18 [phone]

## 2018-12-01 IMAGING — CR DG CHEST 1V PORT
1 series · 1 of 1 positions shown · non-contrast
Comparison: 12/18/2015 chest radiograph

CLINICAL DATA: 55 y/o  F; shortness of breath.

EXAM:
PORTABLE CHEST 1 VIEW

[AP]
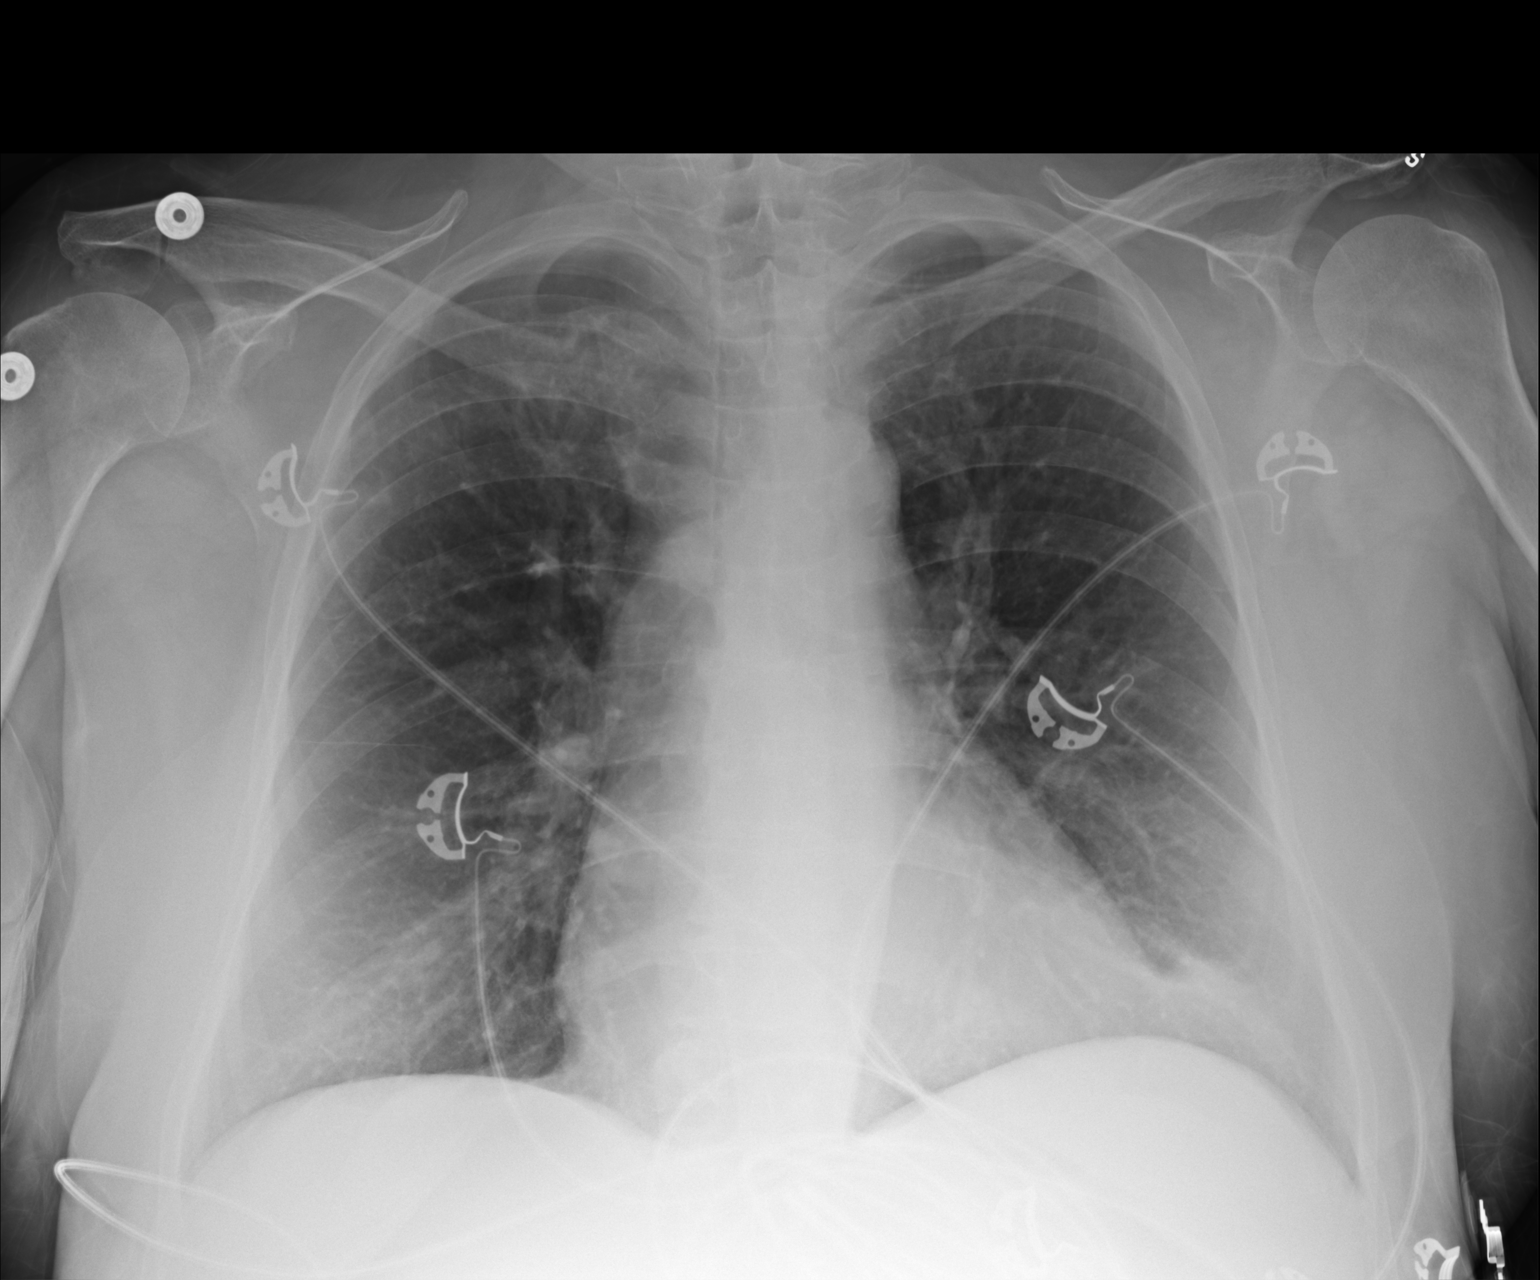

[1 of 1 positions shown; findings below may reference images not displayed]

FINDINGS: Stable cardiac silhouette within normal limits. Prominent epicardial
fat pad. No focal consolidation. No pleural effusion. Bones are
unremarkable.
IMPRESSION: No active disease.

By: Jang Warwick M.D.

## 2018-12-02 IMAGING — CR DG ABDOMEN 1V
2 series · 2 of 2 positions shown · non-contrast
Comparison: None.

CLINICAL DATA: Hepatic cirrhosis.  Abdominal pain.

EXAM:
ABDOMEN - 1 VIEW

[abdomen kub (1 of 2)]
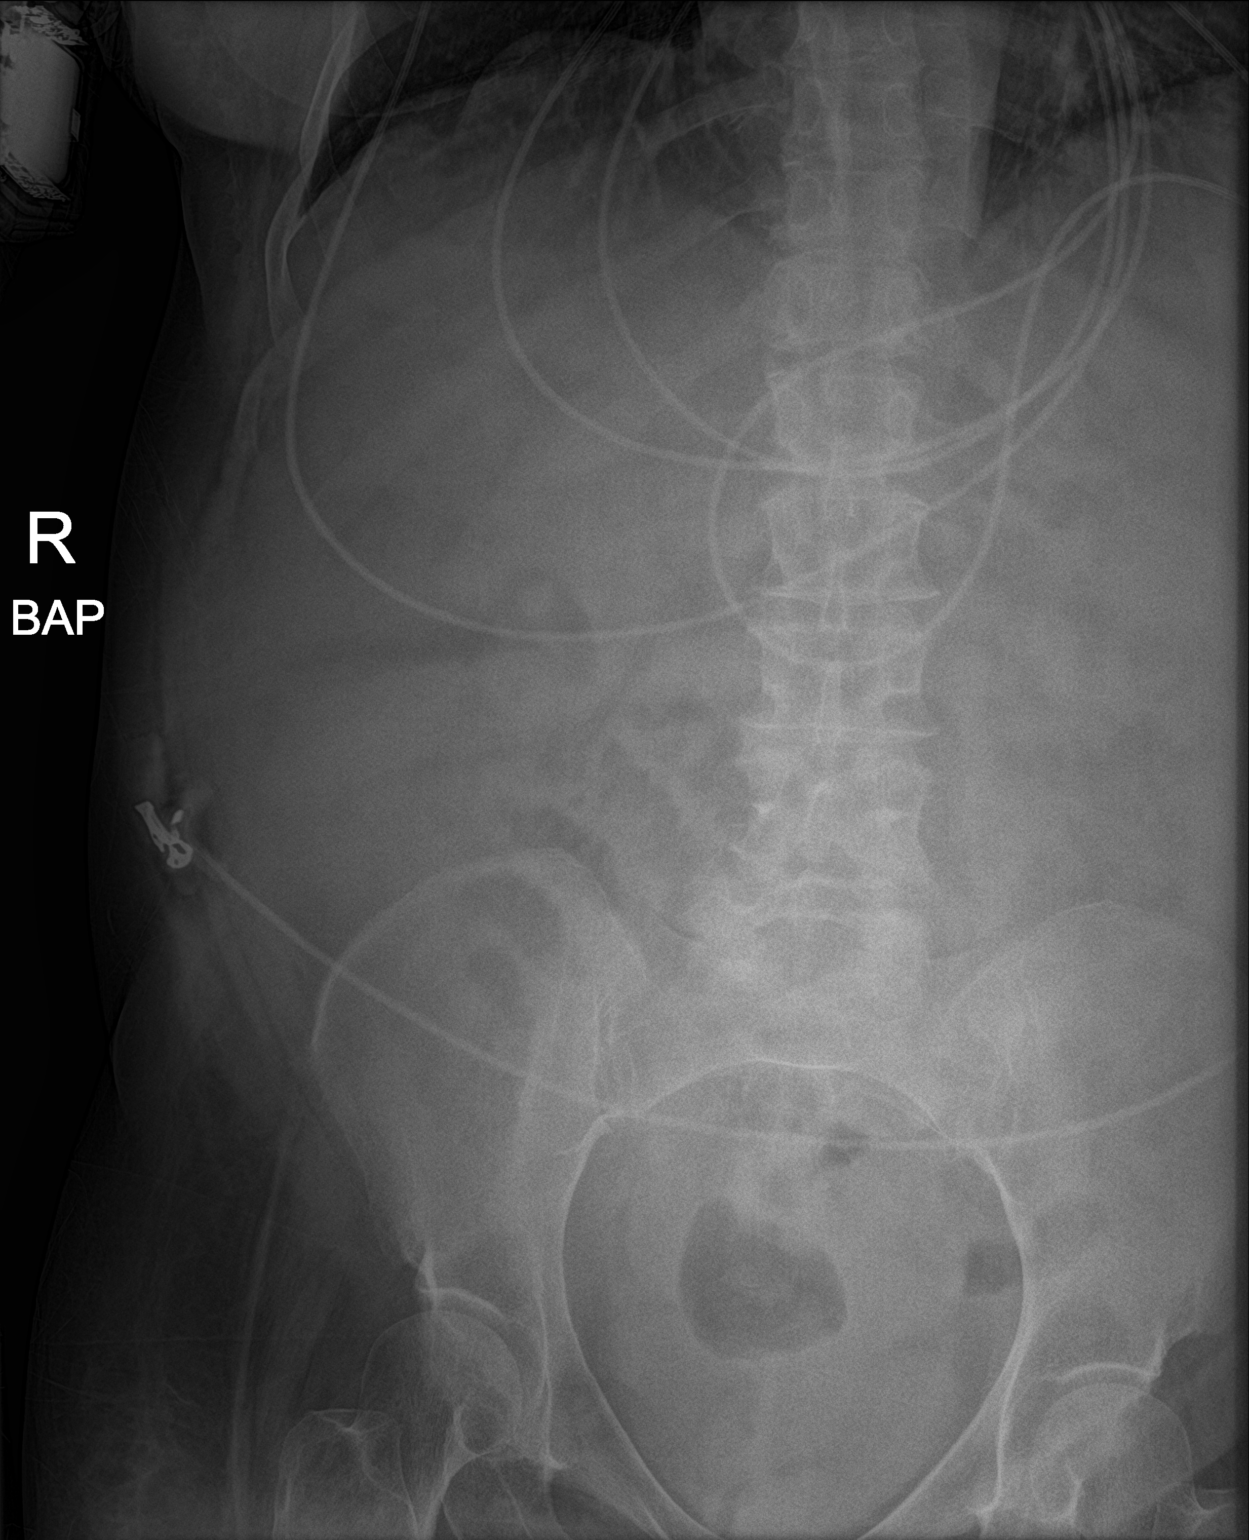

[abdomen kub (2 of 2)]
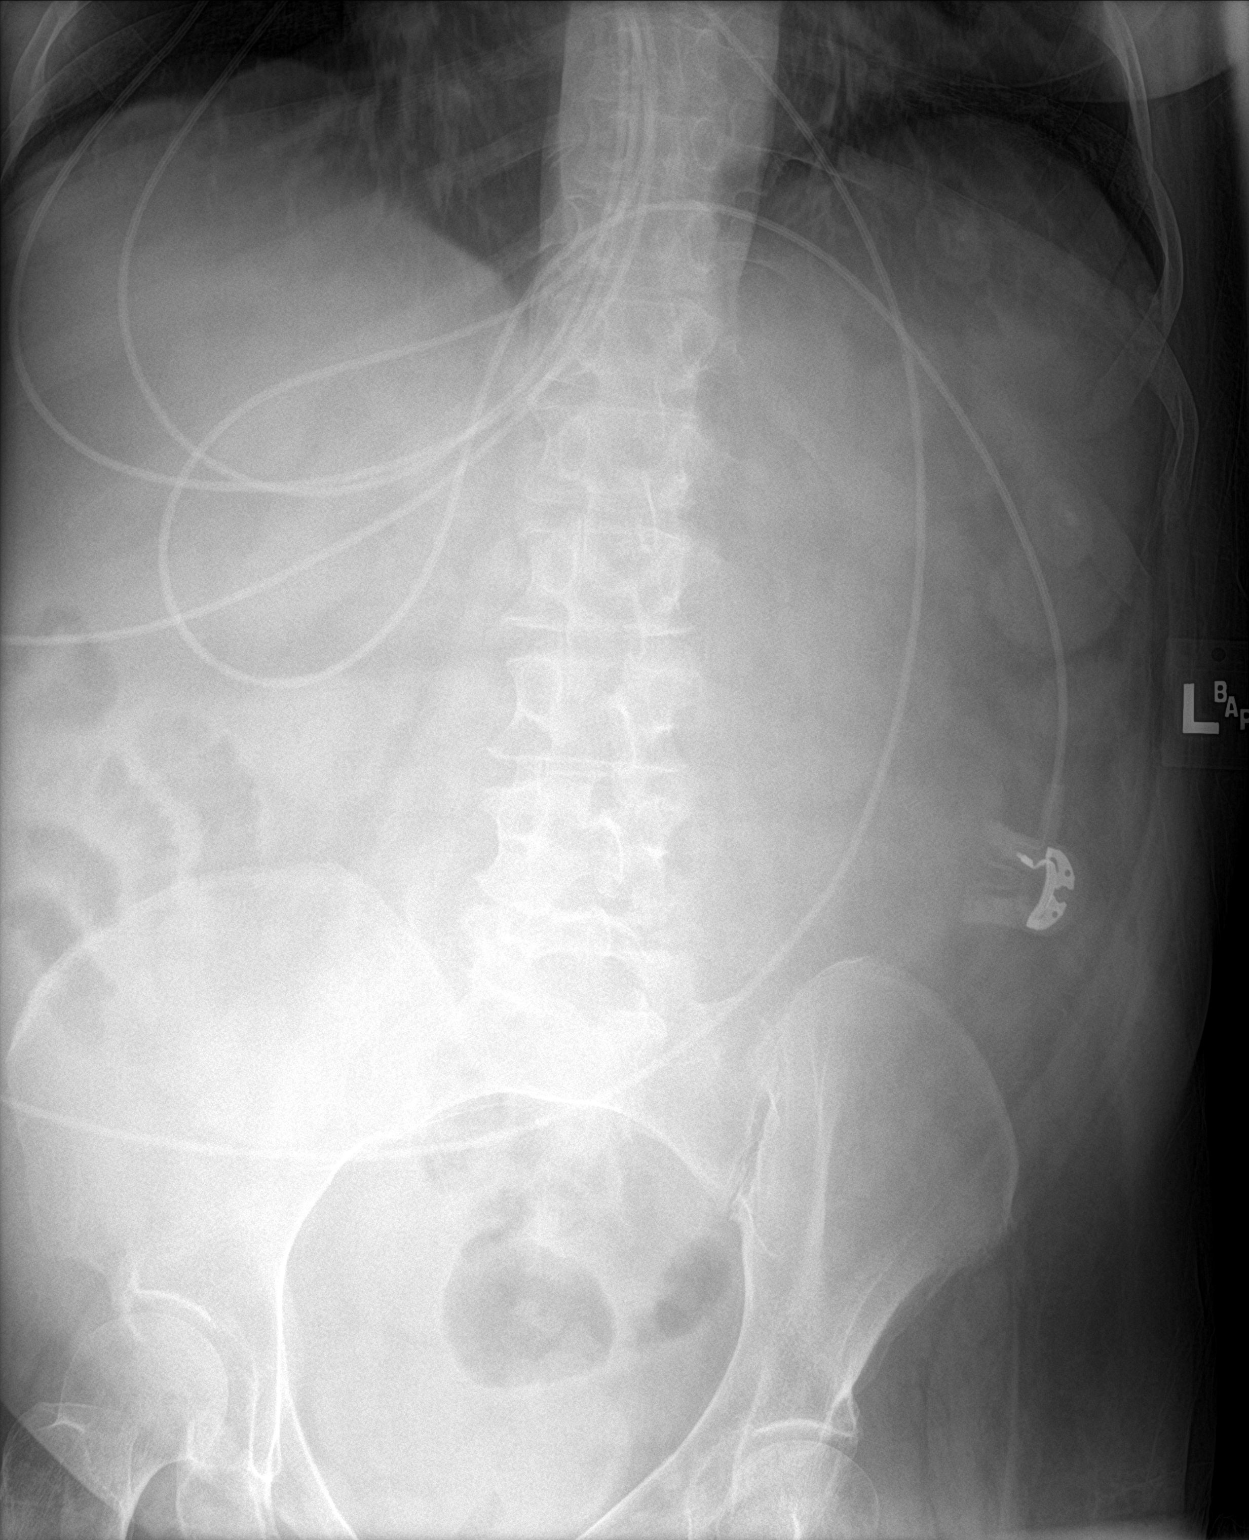

[2 of 2 positions shown; findings below may reference images not displayed]

FINDINGS: The bowel gas pattern is normal. No radio-opaque calculi or other
significant radiographic abnormality are seen.
IMPRESSION: No evidence of bowel obstruction or ileus.

## 2018-12-04 IMAGING — DX DG CHEST 2V
2 series · 2 of 2 positions shown · non-contrast
Comparison: Previous examinations, the most recent dated
01/22/2016.

CLINICAL DATA: Tachypnea.  Cough.

EXAM:
CHEST  2 VIEW

[chest lat]
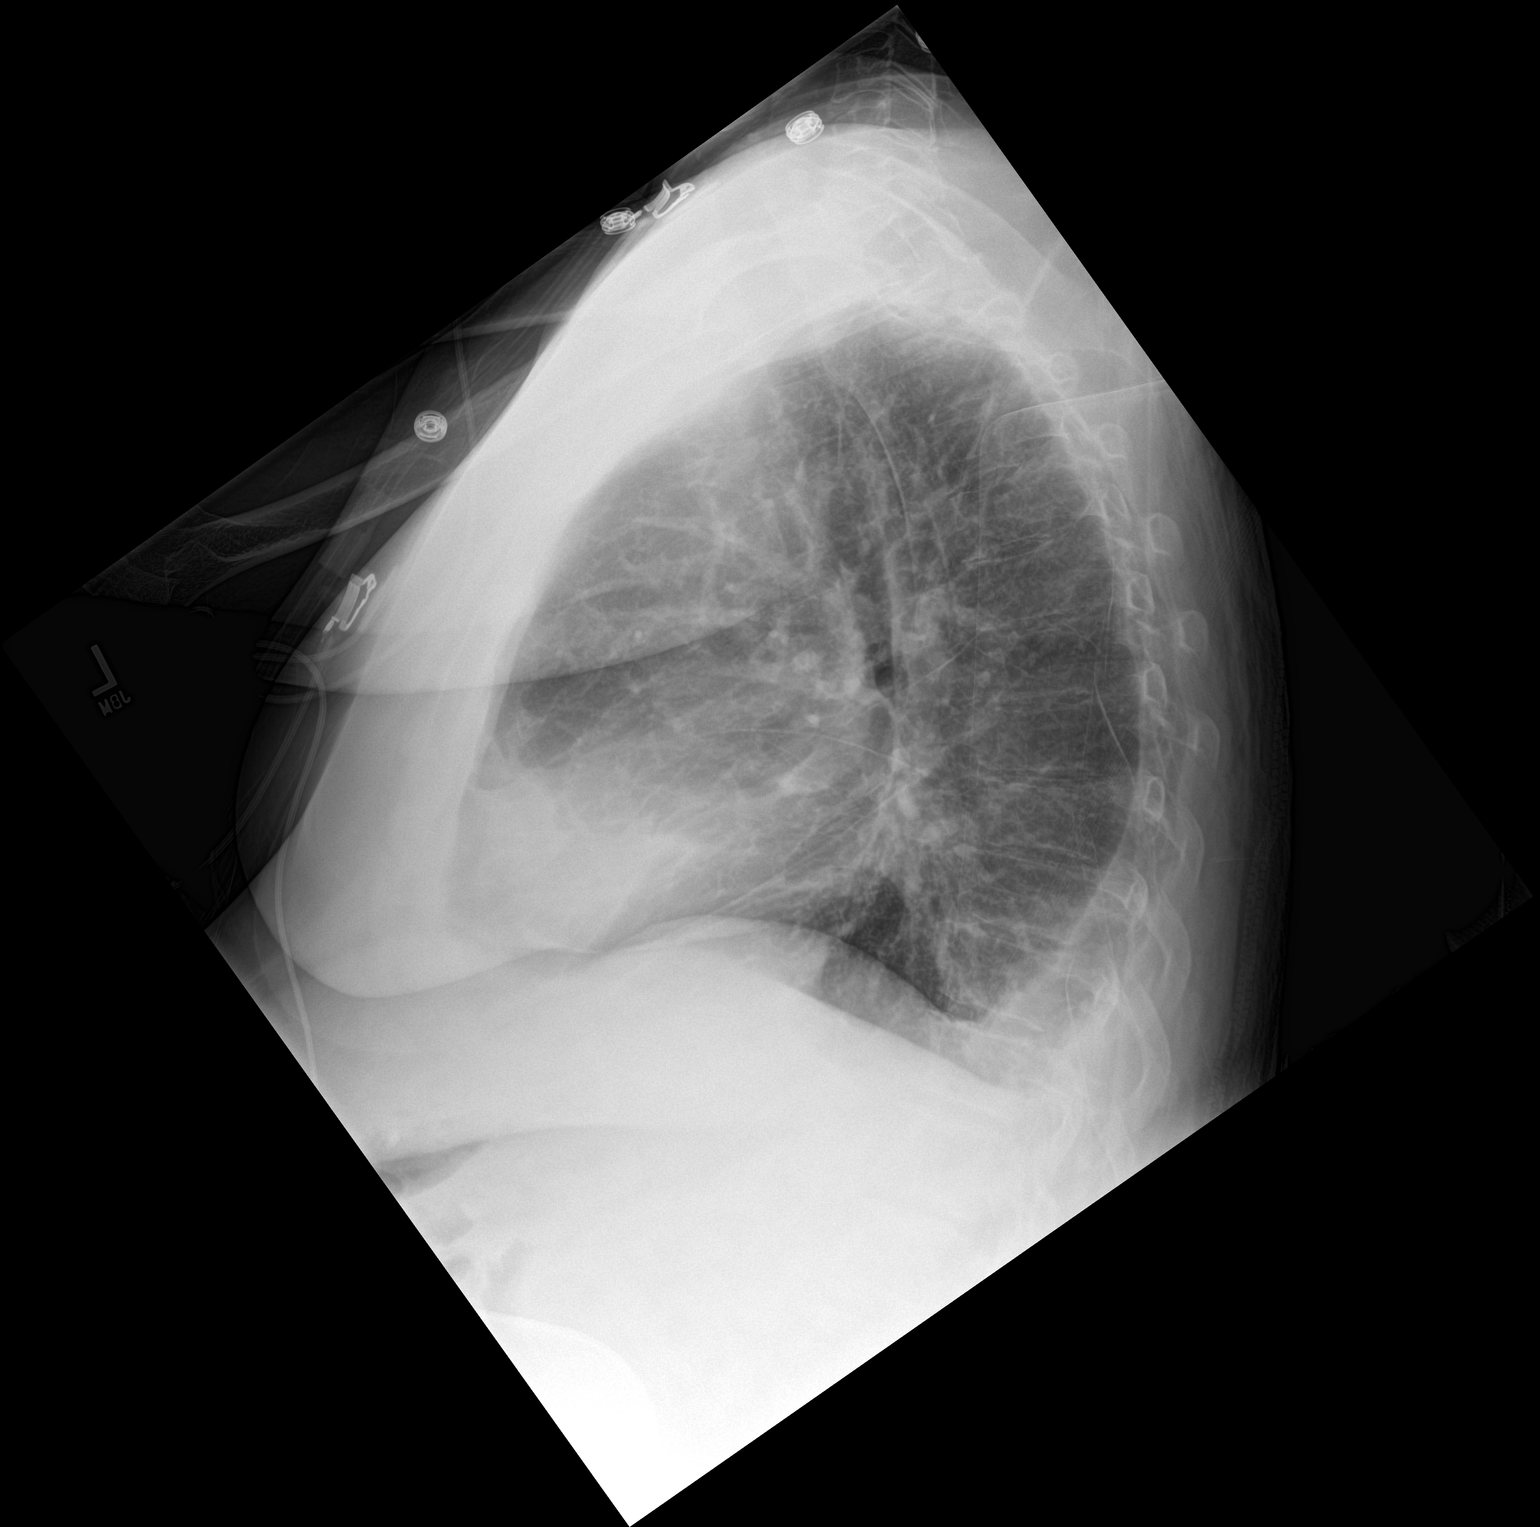

[chest ap]
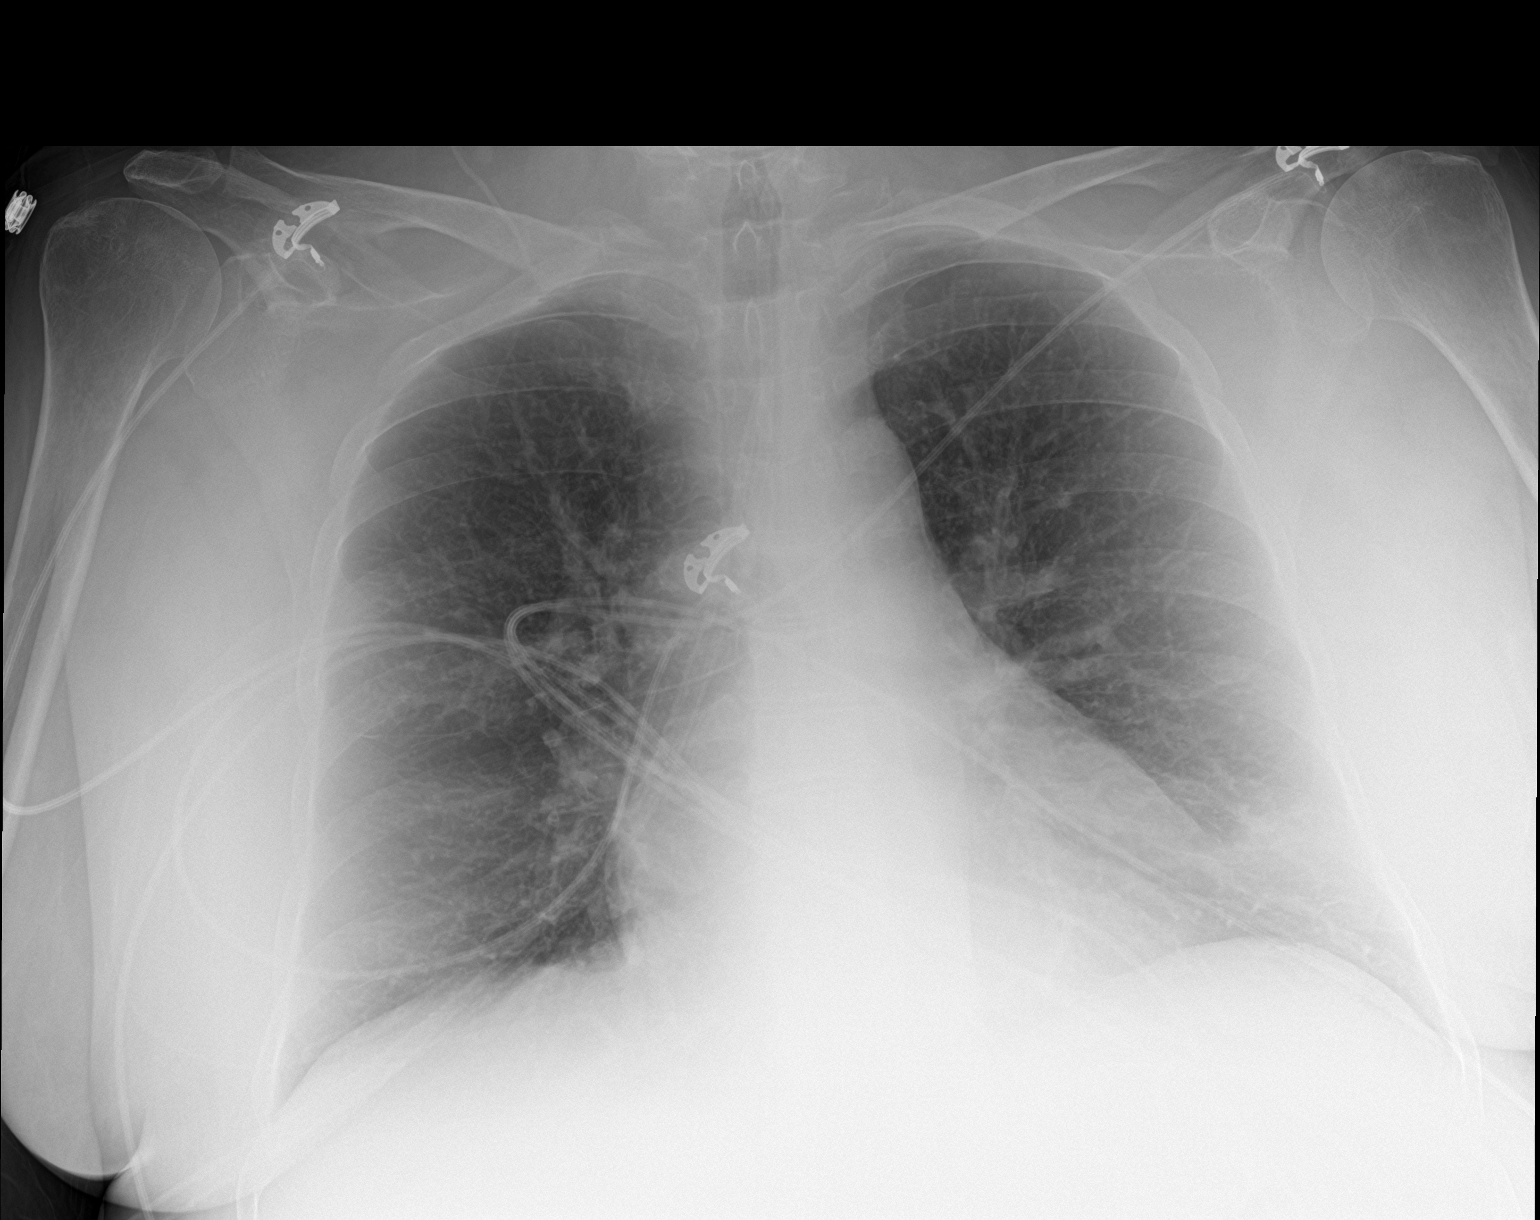

[2 of 2 positions shown; findings below may reference images not displayed]

FINDINGS: The cardiac silhouette remains borderline enlarged. A prominent
epicardial fat pad is again demonstrated with a small area of
probable scarring in that region without significant change.
Otherwise, the lungs are clear with mildly prominent interstitial
markings. Diffuse osteopenia.
IMPRESSION: No acute abnormality. Mild chronic interstitial lung disease and
borderline cardiomegaly.

## 2018-12-04 IMAGING — CR DG ABD PORTABLE 1V
2 series · 2 of 2 positions shown · non-contrast
Comparison: 01/23/2016

CLINICAL DATA: Abdominal distension decreased bowel sounds

EXAM:
PORTABLE ABDOMEN - 1 VIEW

[AP (1 of 2)]
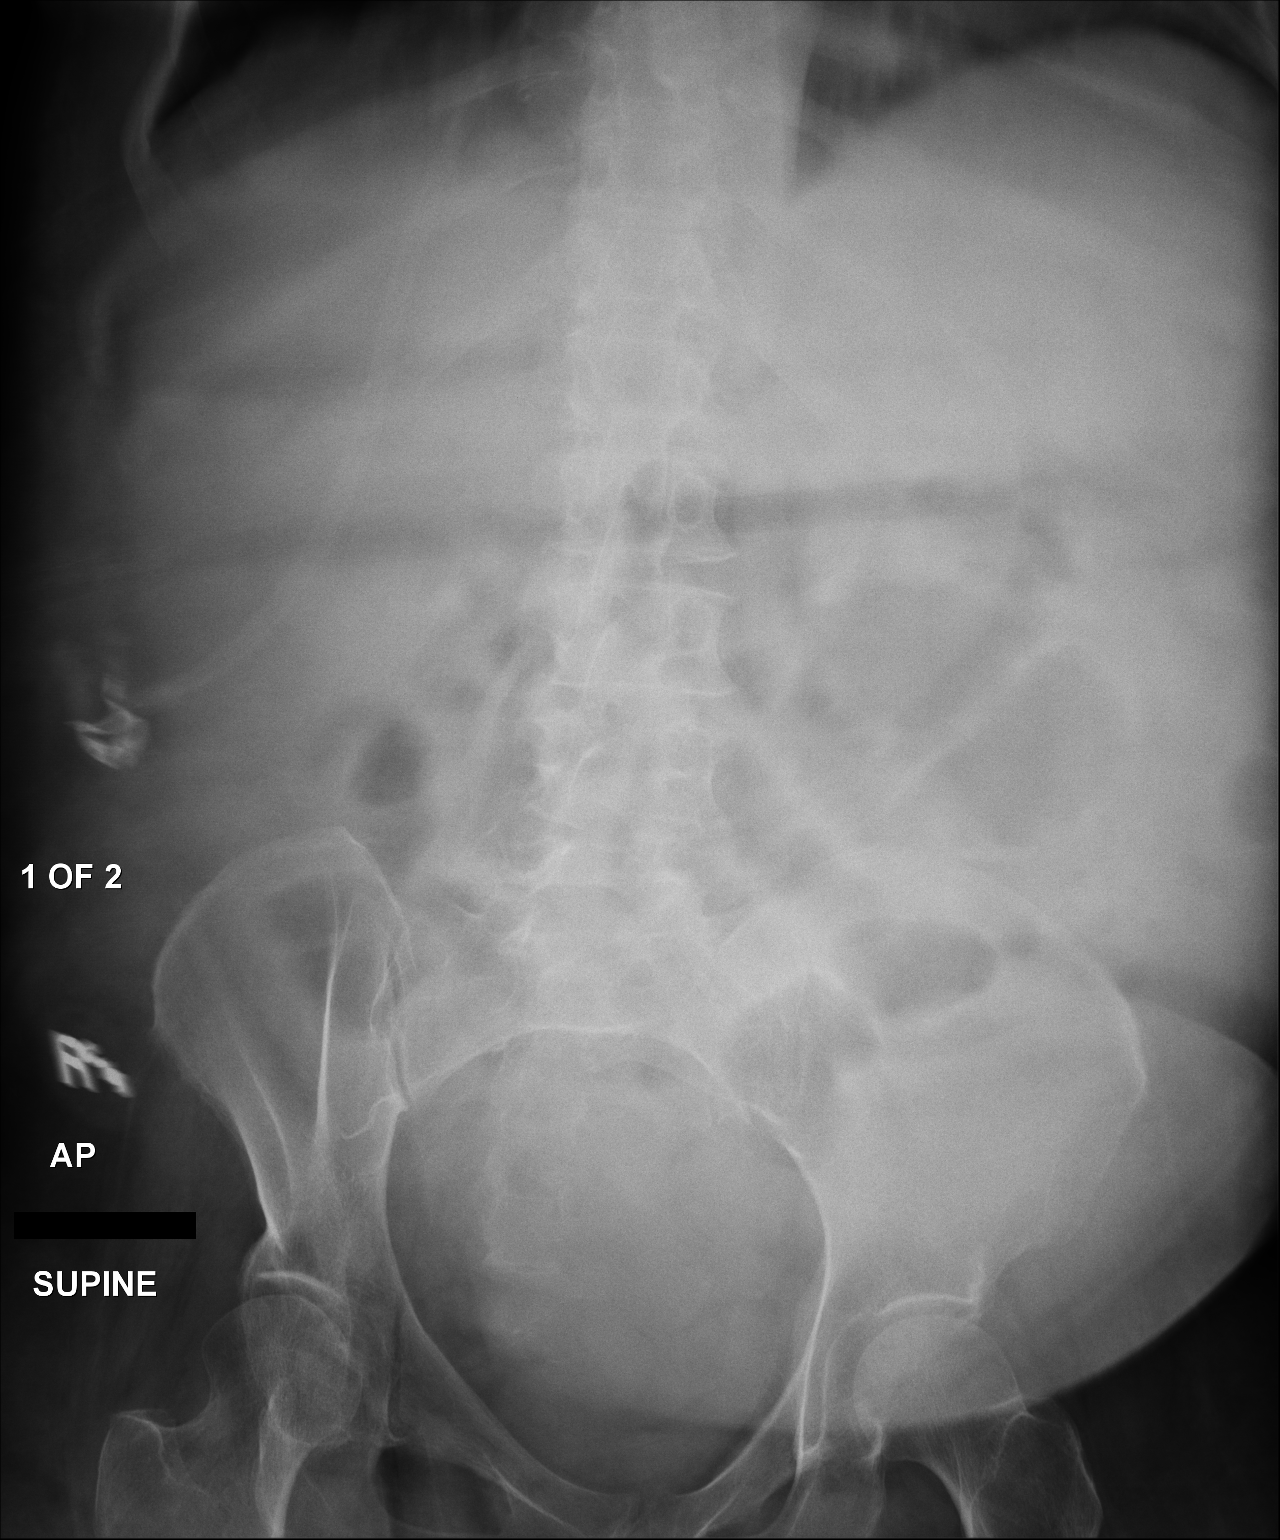

[AP (2 of 2)]
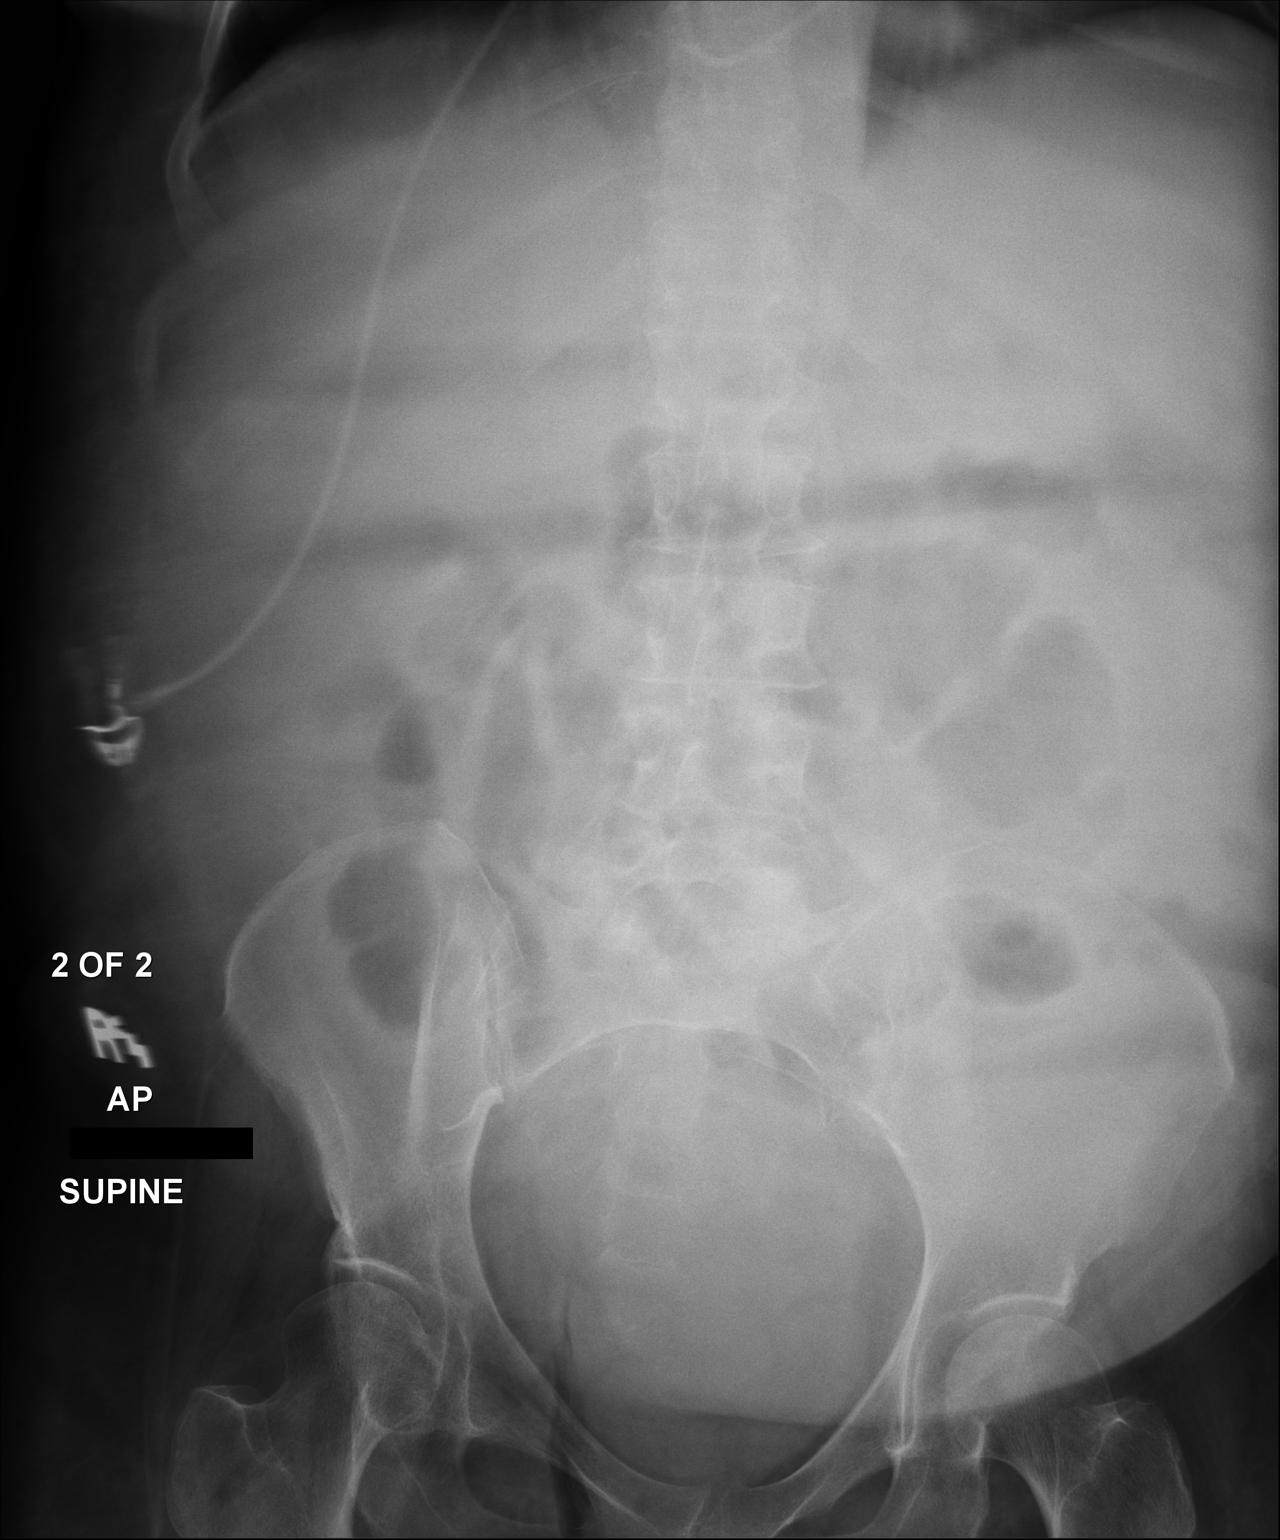

[2 of 2 positions shown; findings below may reference images not displayed]

FINDINGS: Scattered large and small bowel gas is noted. No obstructive changes
are seen. No acute bony abnormality is noted. No mass lesion is
seen.
IMPRESSION: No acute abnormality noted.

## 2018-12-07 IMAGING — CR DG CHEST 1V PORT
1 series · 1 of 1 positions shown · non-contrast
Comparison: 01/27/2016

CLINICAL DATA: Shortness of breath

EXAM:
PORTABLE CHEST 1 VIEW

[AP]
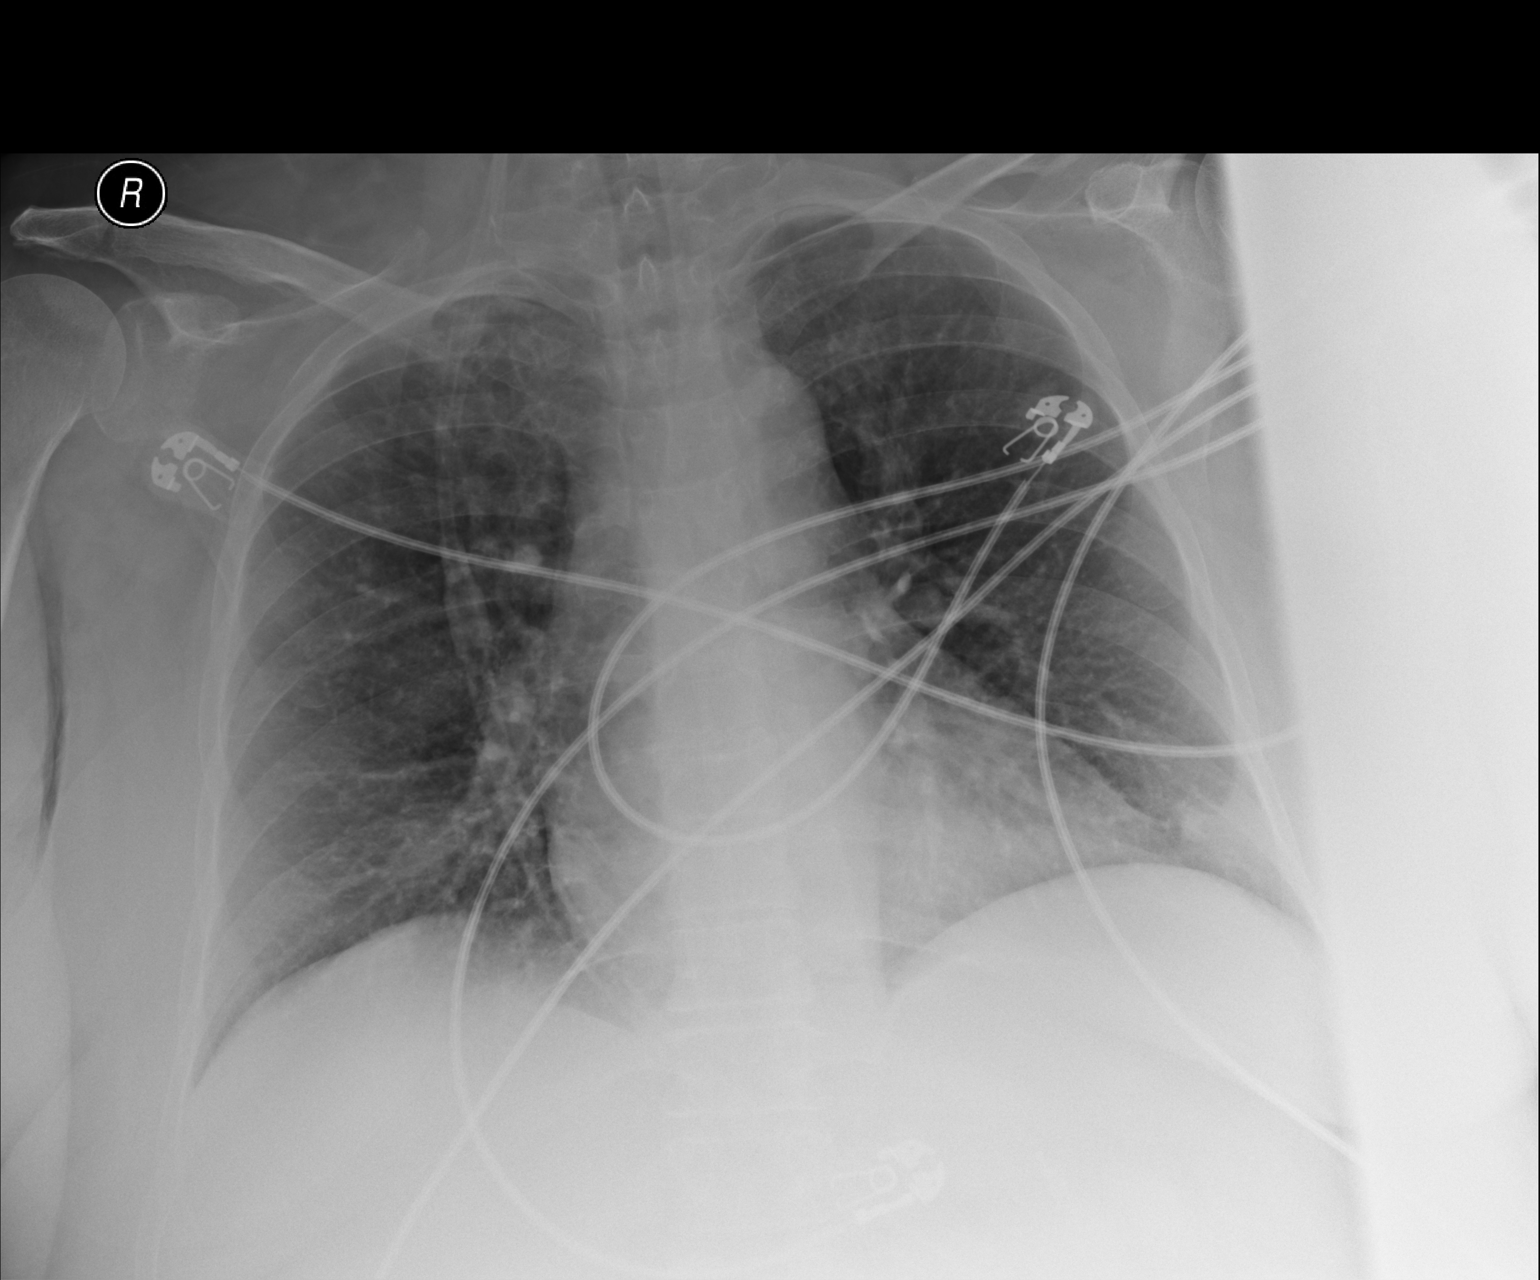

[1 of 1 positions shown; findings below may reference images not displayed]

FINDINGS: The heart size and mediastinal contours are within normal limits.
Both lungs are clear. The visualized skeletal structures are
unremarkable.
IMPRESSION: No active disease.

## 2018-12-08 IMAGING — DX DG ABDOMEN 1V
2 series · 2 of 2 positions shown · non-contrast
Comparison: 01/25/2016.  01/23/2016

CLINICAL DATA: Abdominal distention

EXAM:
ABDOMEN - 1 VIEW

[abdomen kub (1 of 2)]
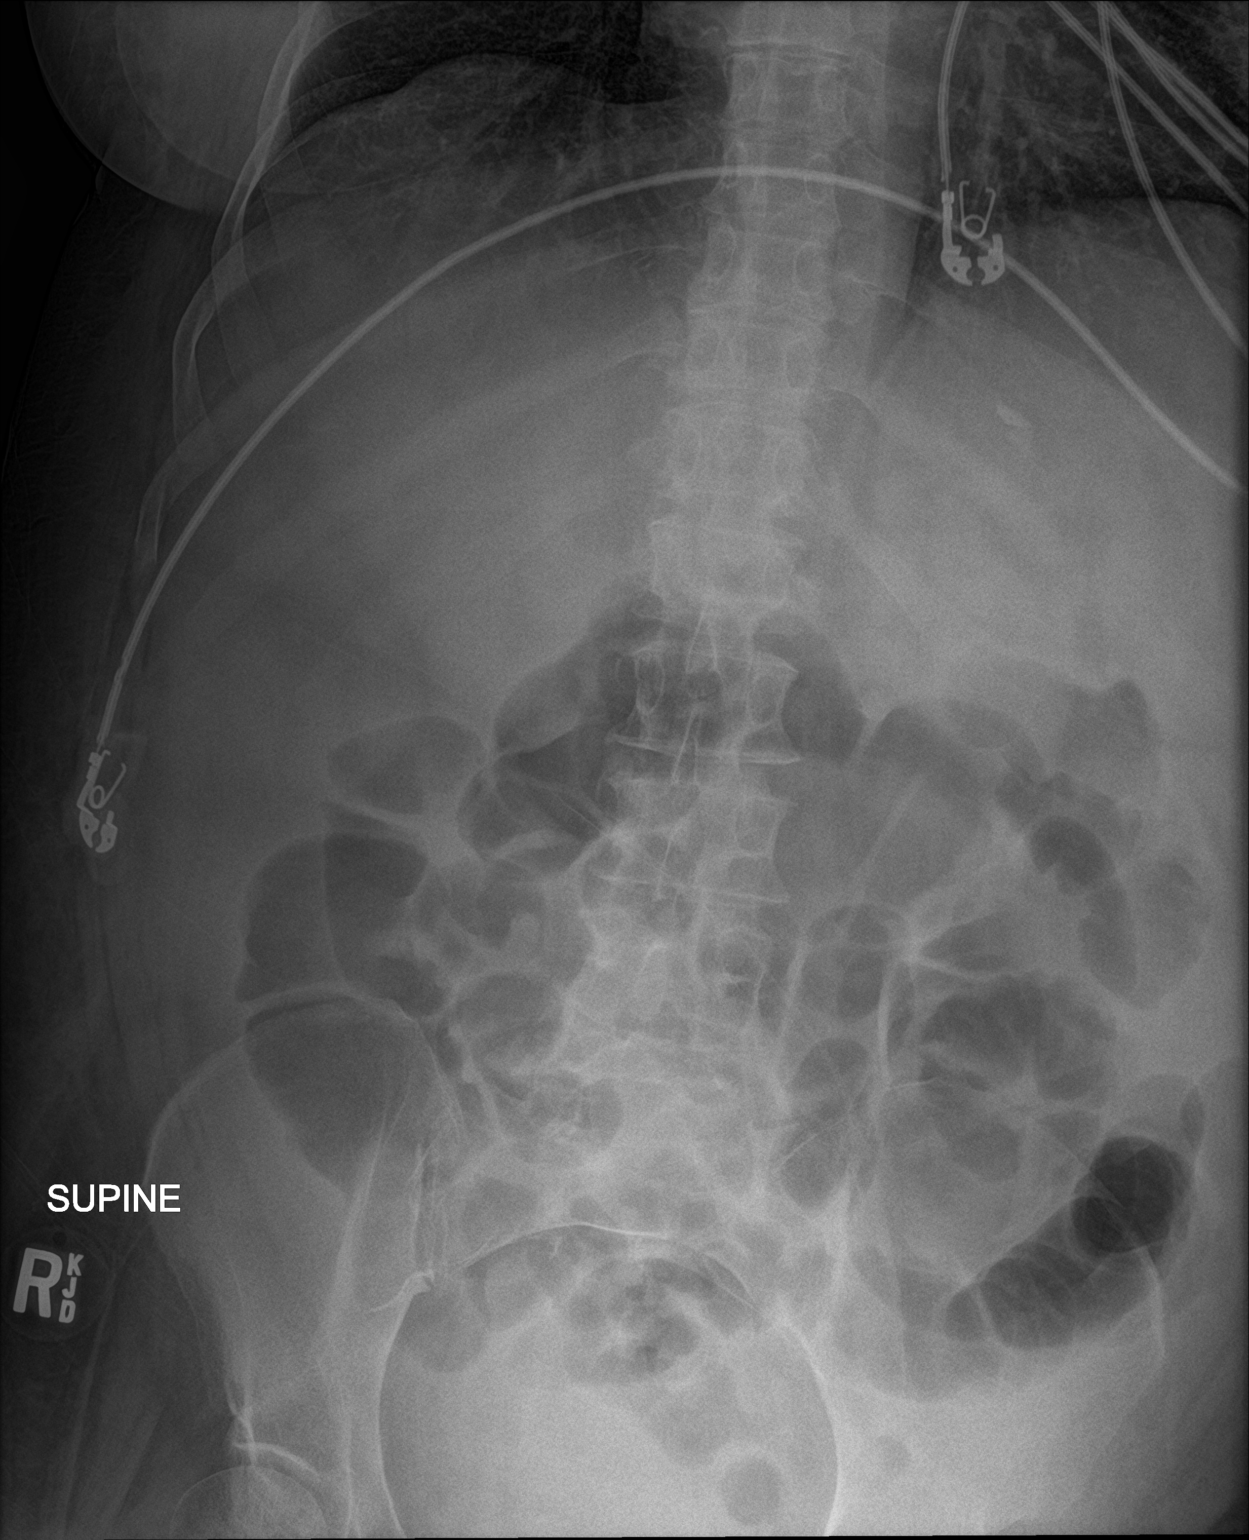

[abdomen kub (2 of 2)]
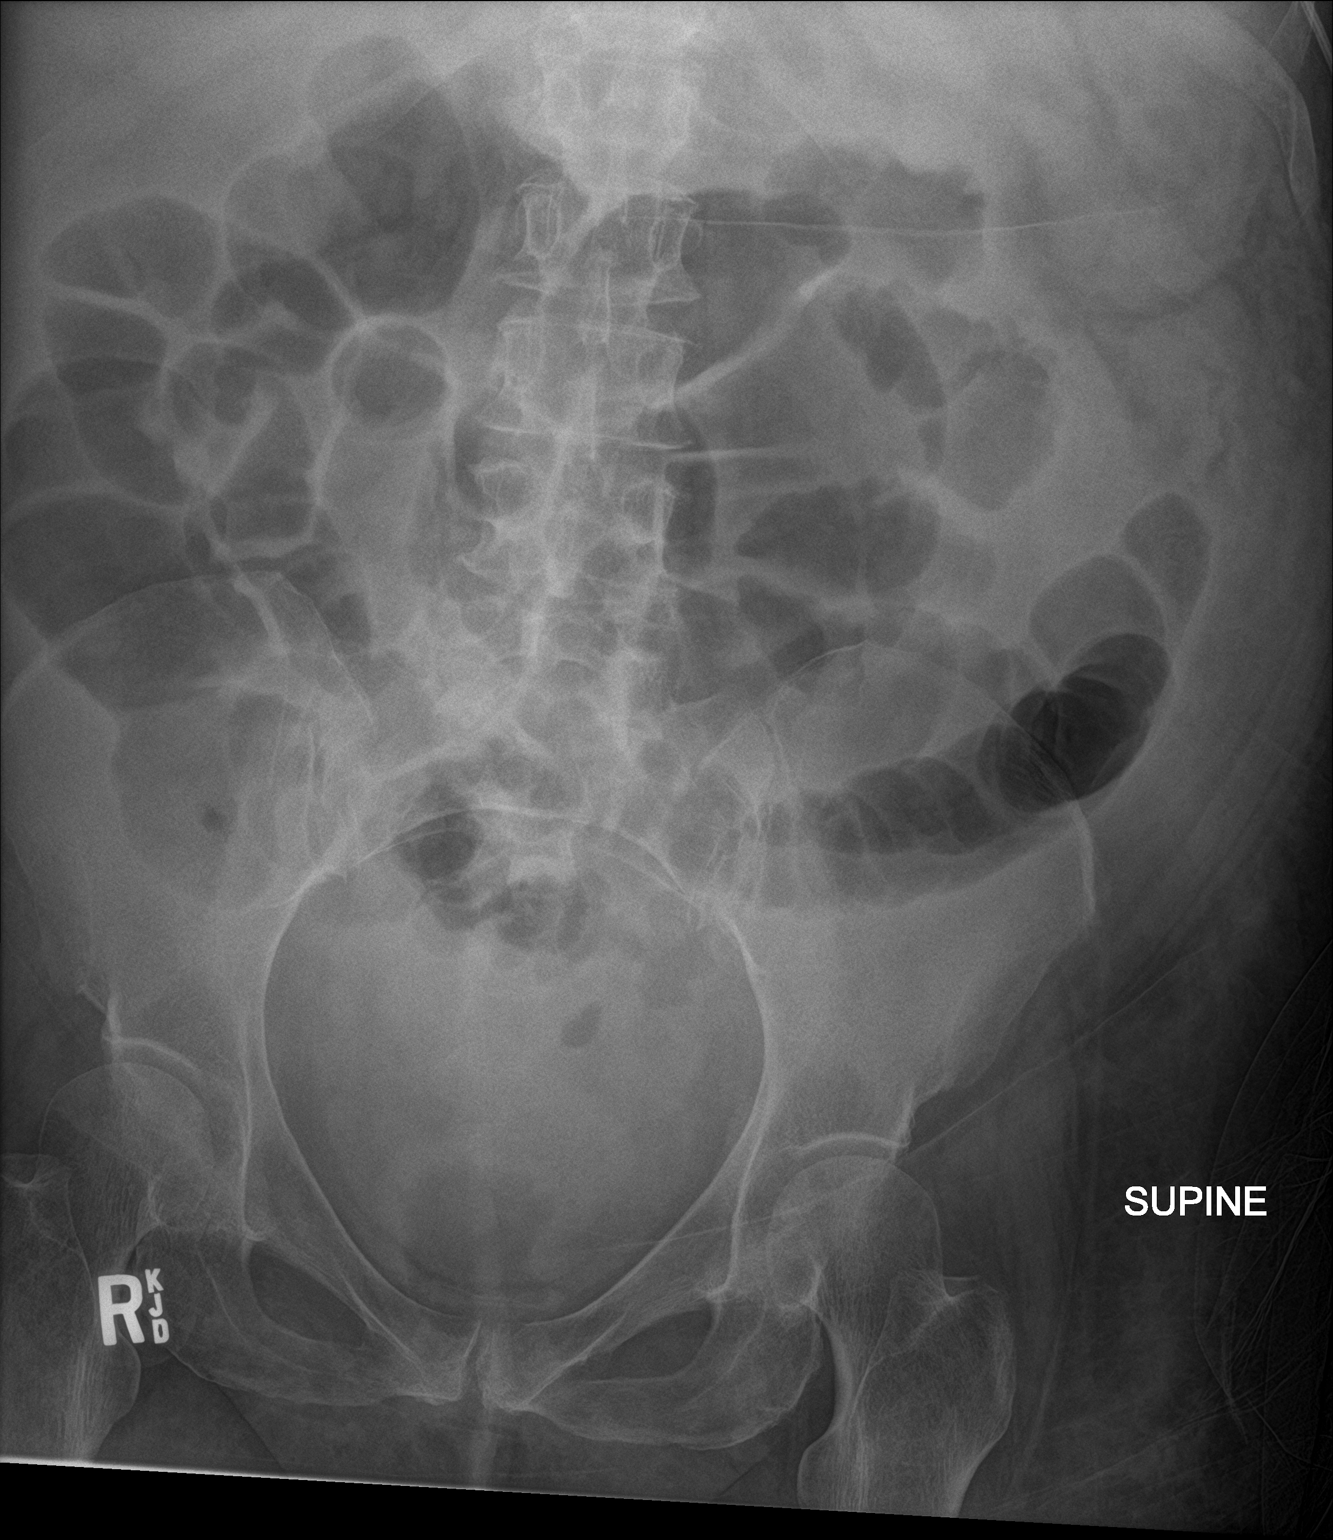

[2 of 2 positions shown; findings below may reference images not displayed]

FINDINGS: Interval progression of diffuse gaseous bowel distention with a
central predominance. Visualized bony anatomy is unremarkable.
Telemetry leads overlie the lower chest.
IMPRESSION: Gaseous bowel distention in the central abdomen compatible with
ileus or evolving obstruction. Centralization of bowel loops
suggests associated ascites.

## 2018-12-10 DEATH — deceased

## 2019-07-20 IMAGING — CR DG CHEST 2V SAME DAY
2 series · 2 of 2 positions shown · non-contrast
Comparison: Earlier the same day

CLINICAL DATA: Abnormality seen at right lung base on prior chest
x-ray.

EXAM:
CHEST  2 VIEW

[w chest lat]
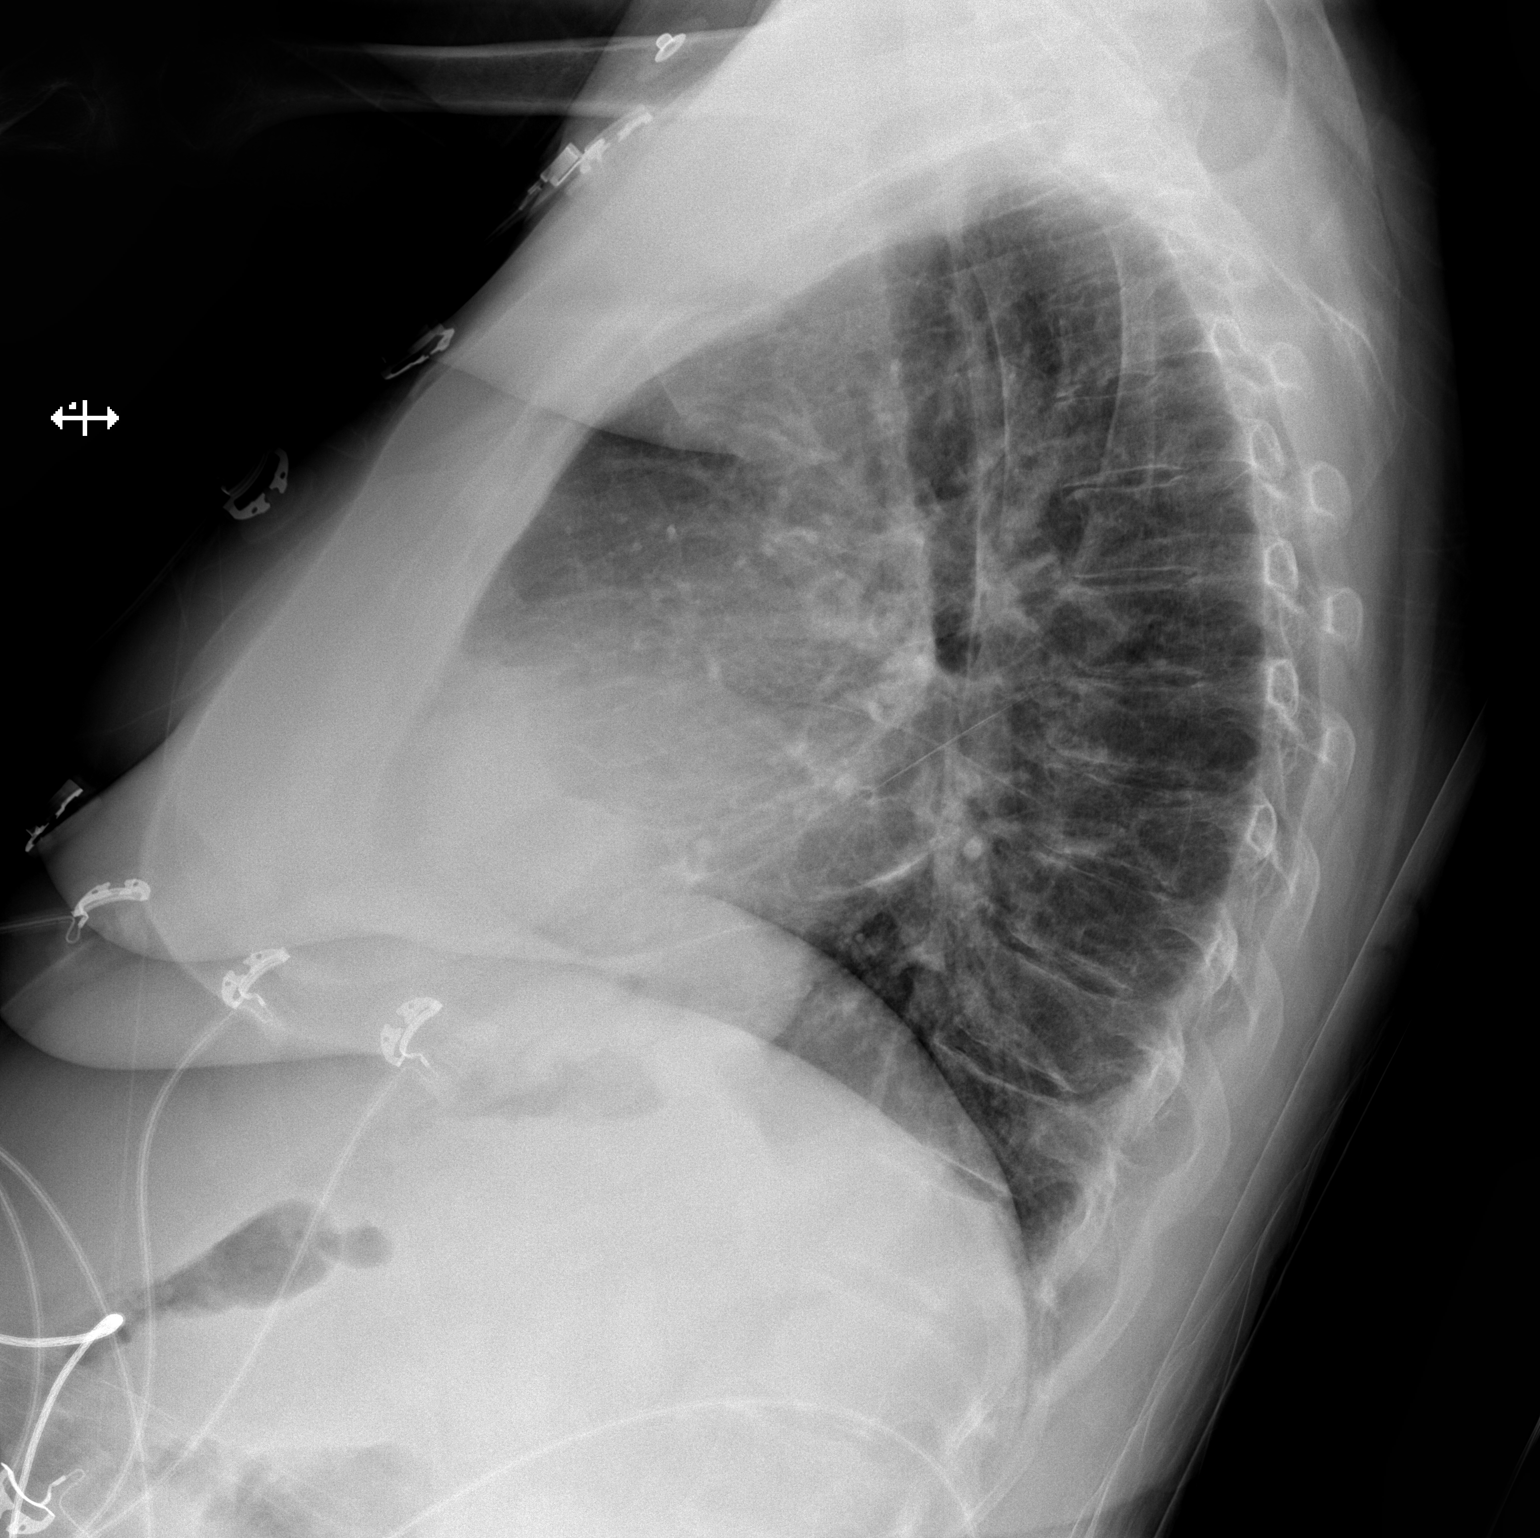

[x chest ap]
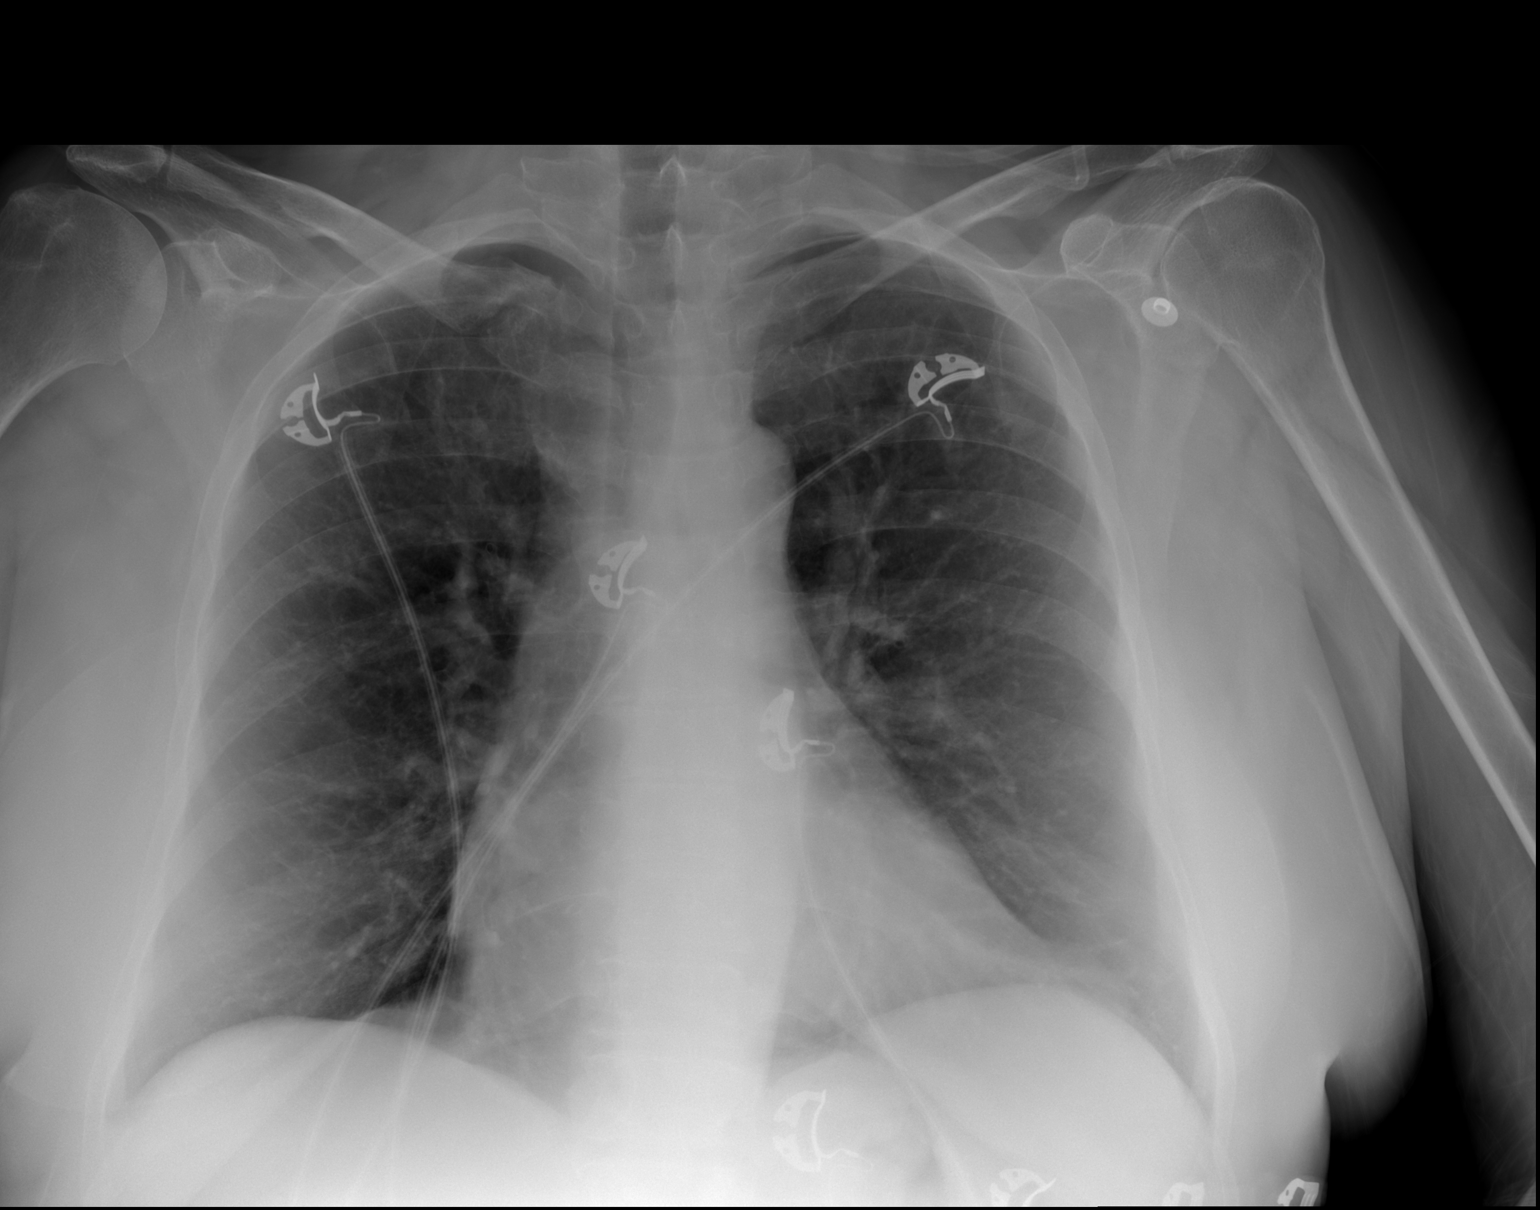

[2 of 2 positions shown; findings below may reference images not displayed]

FINDINGS: Patient was unable to stand for true PA frontal positioning. As
such, an AP exam was again obtained. The focal opacity seen at the
right lung base on the prior study is not evident on this film. Left
lung is clear. The cardiopericardial silhouette is within normal
limits for size. The visualized bony structures of the thorax are
intact. Telemetry leads overlie the chest.
IMPRESSION: Limited study due to the patient's inability to stand for PA
imaging. Abnormality at the right base on the prior study not
readily evident on this film. Finding unlikely to be an acute
abnormality and repeat nonemergent two-view chest x-ray, after
resolution of acute symptoms, could be used to further evaluate.

## 2019-07-20 IMAGING — DX DG WRIST COMPLETE 3+V*L*
2 series · 2 of 2 positions shown · non-contrast
Comparison: None.

CLINICAL DATA: Status post fall with wrist pain.

EXAM:
LEFT WRIST - COMPLETE 3+ VIEW

[wrist ap]
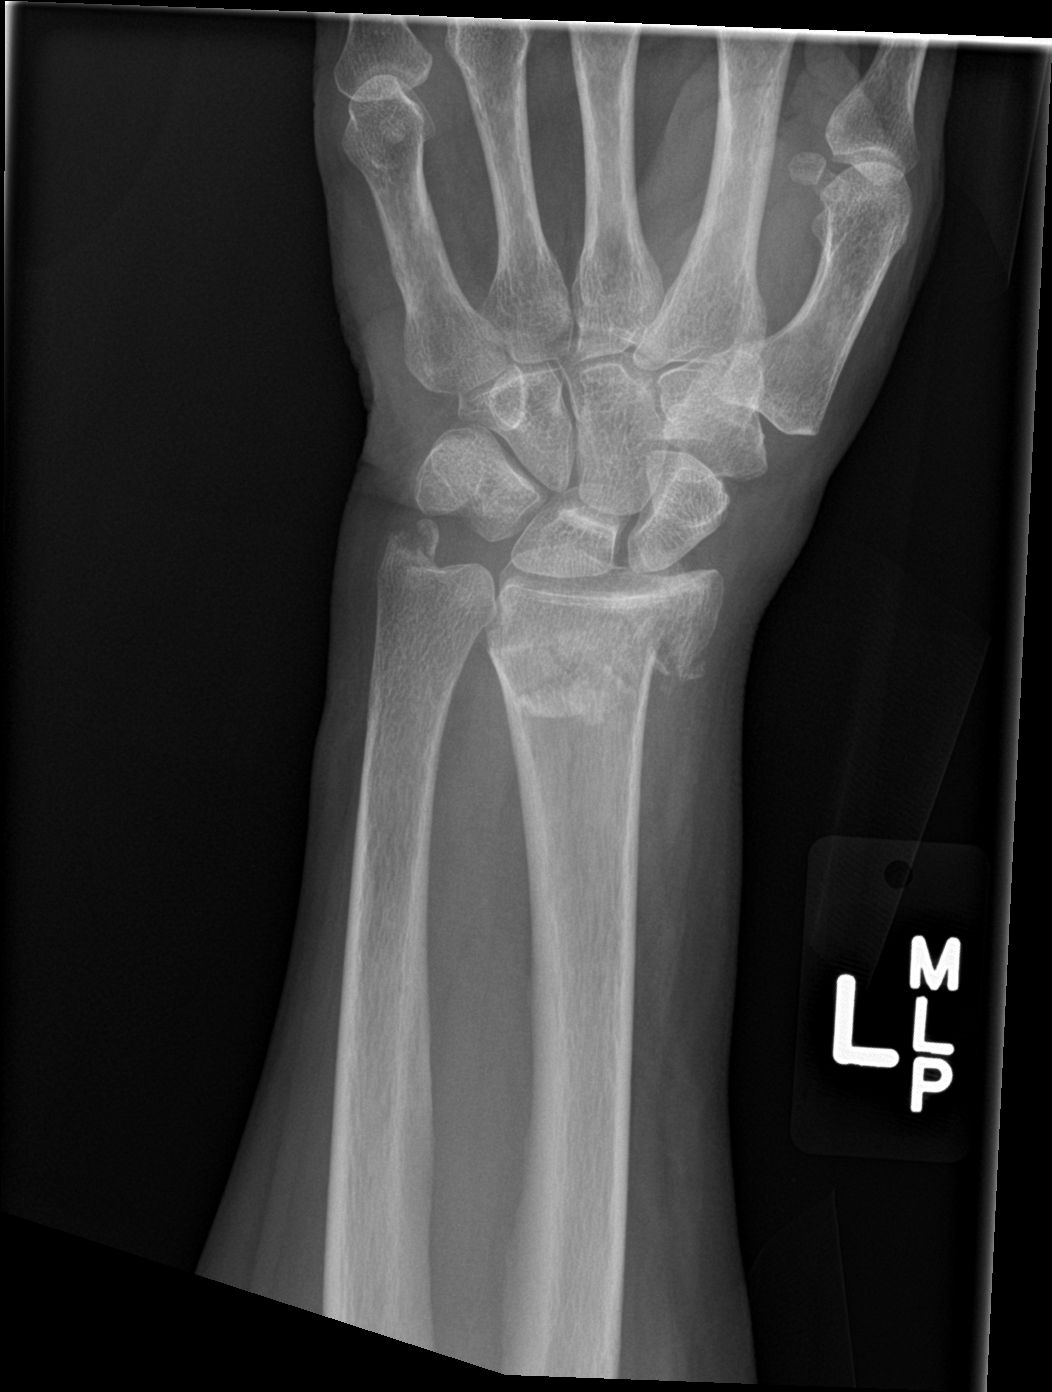

[wrist obl]
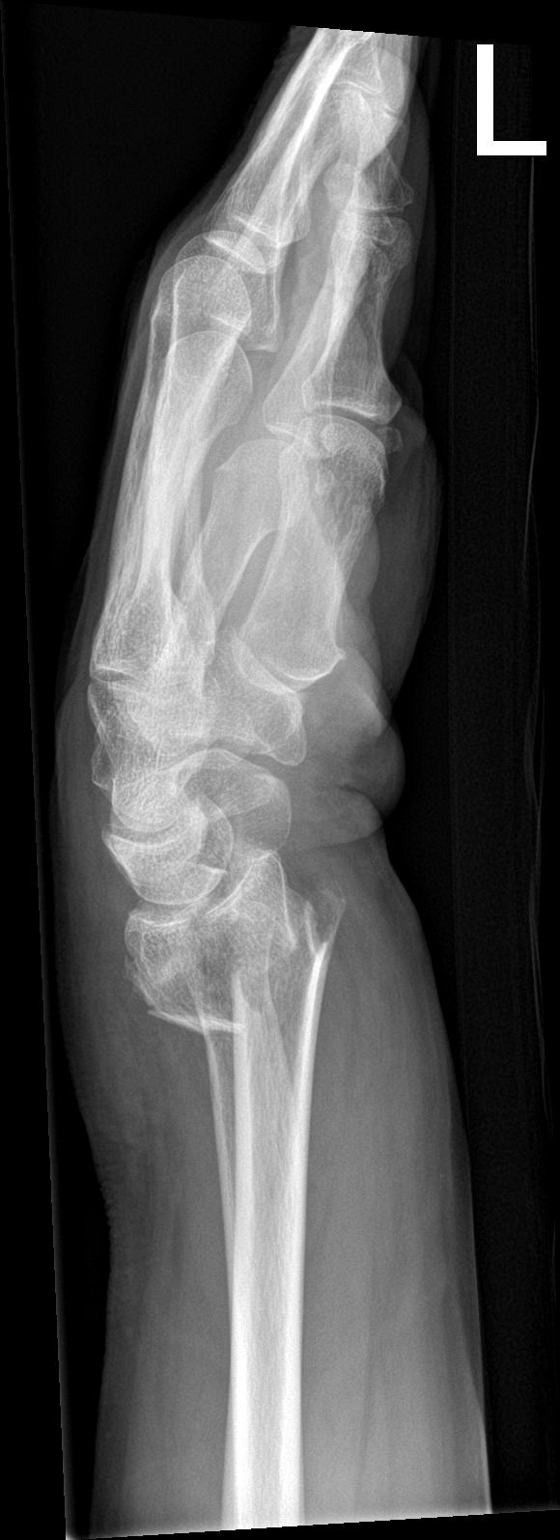

[2 of 2 positions shown; findings below may reference images not displayed]

FINDINGS: There is an impacted comminuted intra-articular fracture of the
distal left radius. There is a [DATE] shaft posterior dislocation of
the distal fracture fragment. There is an accompanied avulsion
fracture of the ulnar styloid process. Associated soft tissue
swelling.
IMPRESSION: Impacted comminuted intra-articular fracture of the distal left
radius.

Accompanied avulsion fracture of the ulnar styloid process.

## 2019-07-20 IMAGING — DX DG CHEST 1V PORT
1 series · 1 of 1 positions shown · non-contrast
Comparison: Chest x-ray of January 28, 2016

CLINICAL DATA: Hypoxia.  Long-term smoker.  History of COPD.

EXAM:
PORTABLE CHEST 1 VIEW

[chest ap]
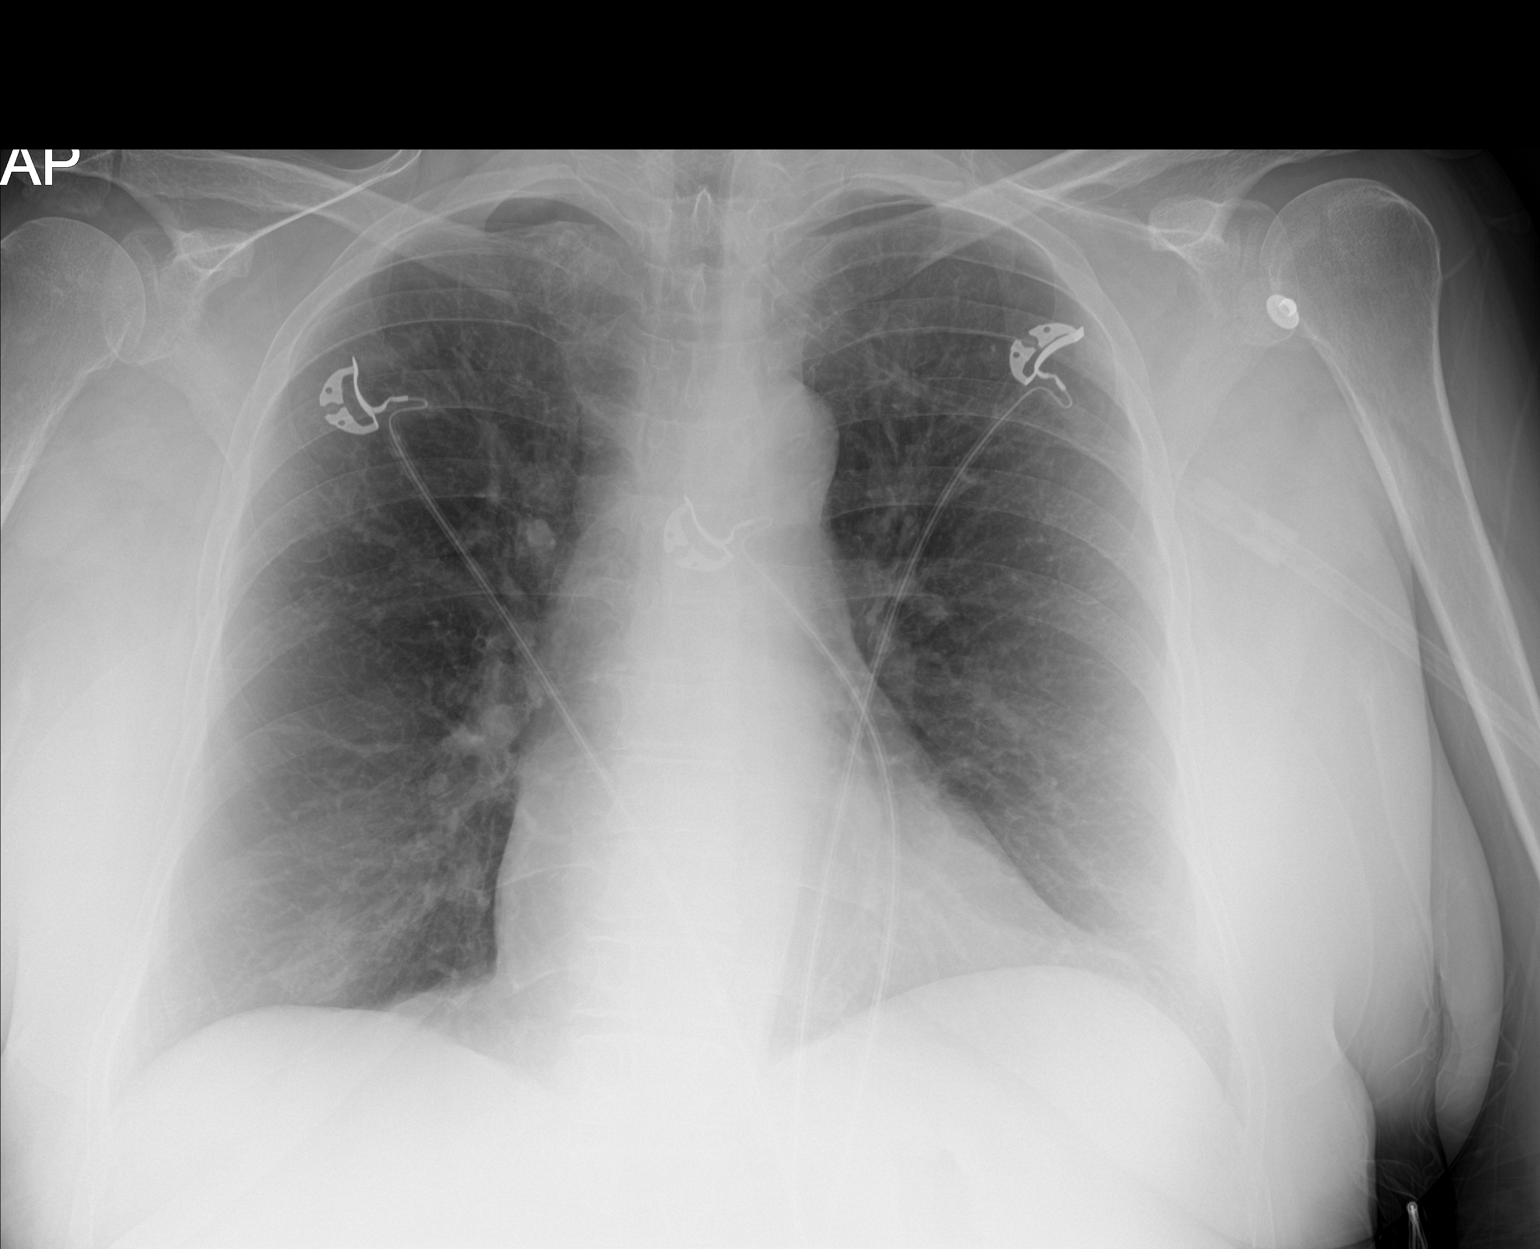

[1 of 1 positions shown; findings below may reference images not displayed]

FINDINGS: The lungs are well-expanded. There is subtle ovoid increased density
in the right infrahilar region. The interstitial markings are
coarse. There is no pleural effusion. The heart is normal in size.
The pulmonary vascularity is not engorged. There is calcification in
the wall of the aortic arch. The trachea is midline. The bony thorax
exhibits no acute abnormality.
IMPRESSION: COPD. Subtle increased density just above the right hemidiaphragm
merits further evaluation either with a PA and lateral chest x-ray
or chest CT scan to exclude an abnormal nodule. There is no CHF.

Thoracic aortic atherosclerosis.

## 2019-10-18 IMAGING — DX DG CHEST 1V PORT
2 series · 2 of 2 positions shown · non-contrast
Comparison: Chest radiograph dated 09/09/2016

CLINICAL DATA: 56-year-old female with shortness of breath.

EXAM:
PORTABLE CHEST 1 VIEW

[chest ap (1 of 2)]
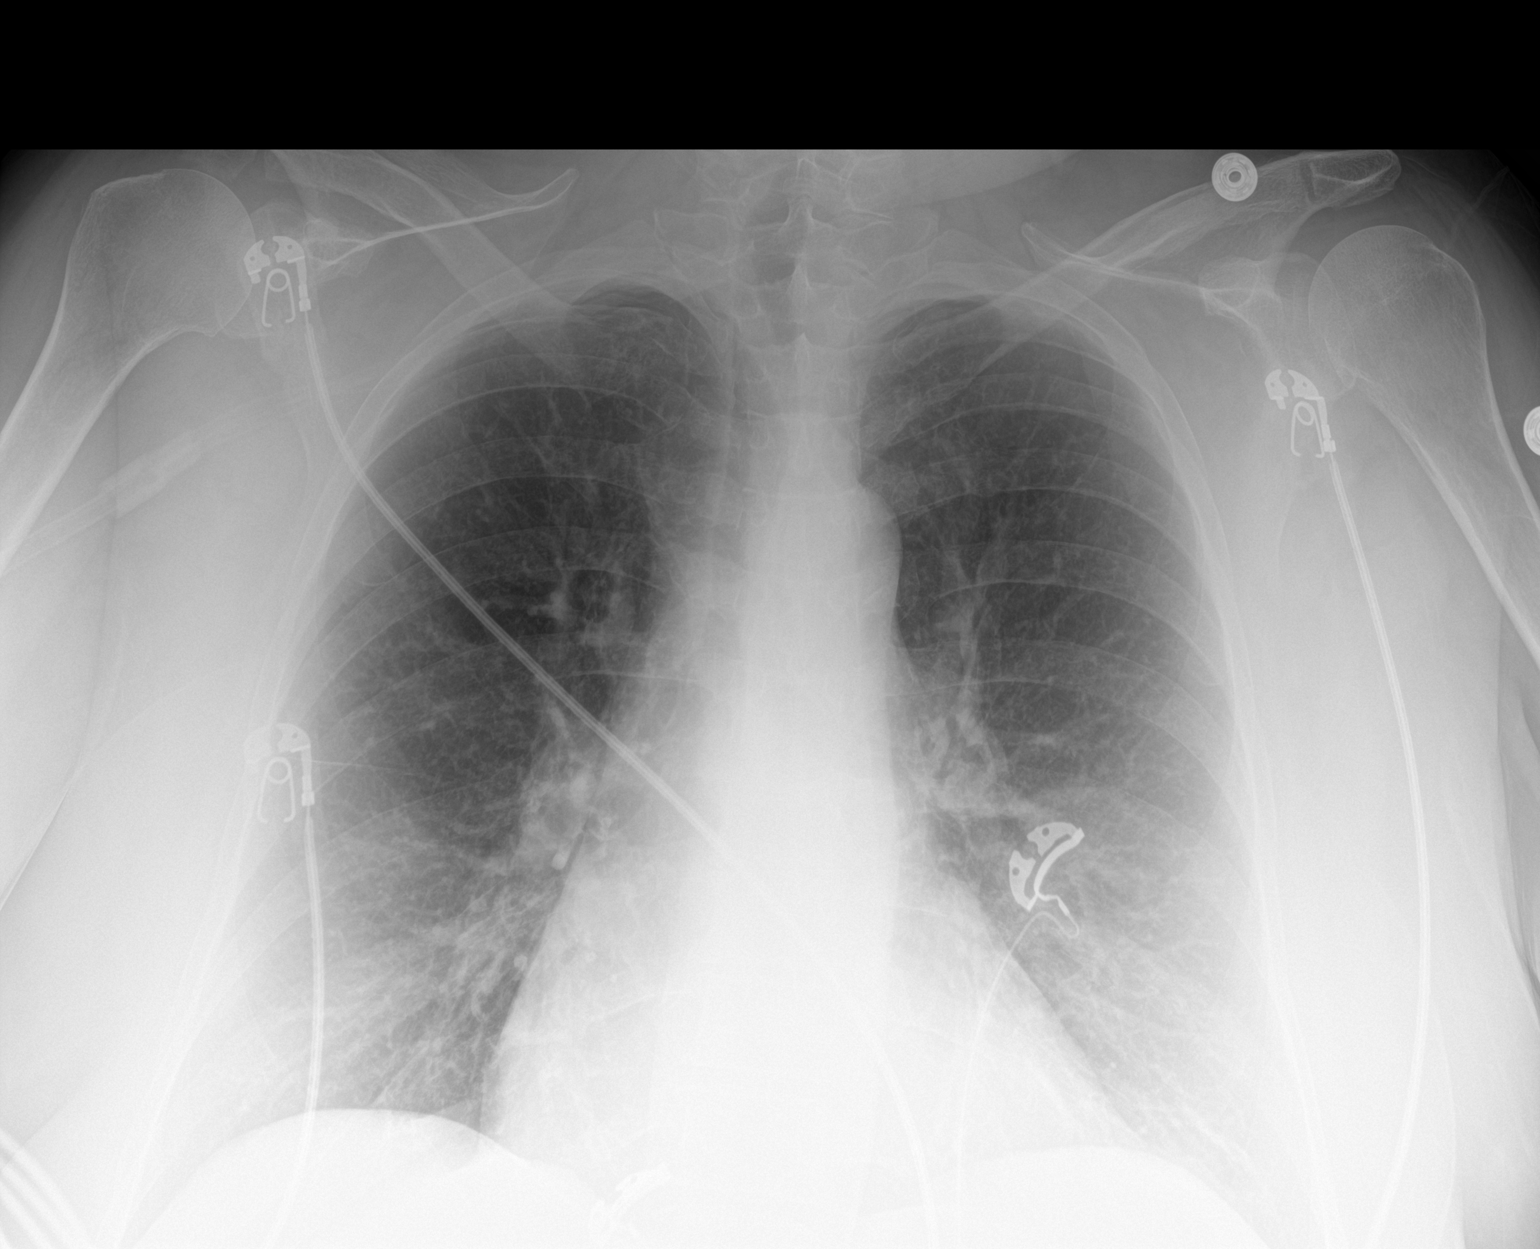

[chest ap (2 of 2)]
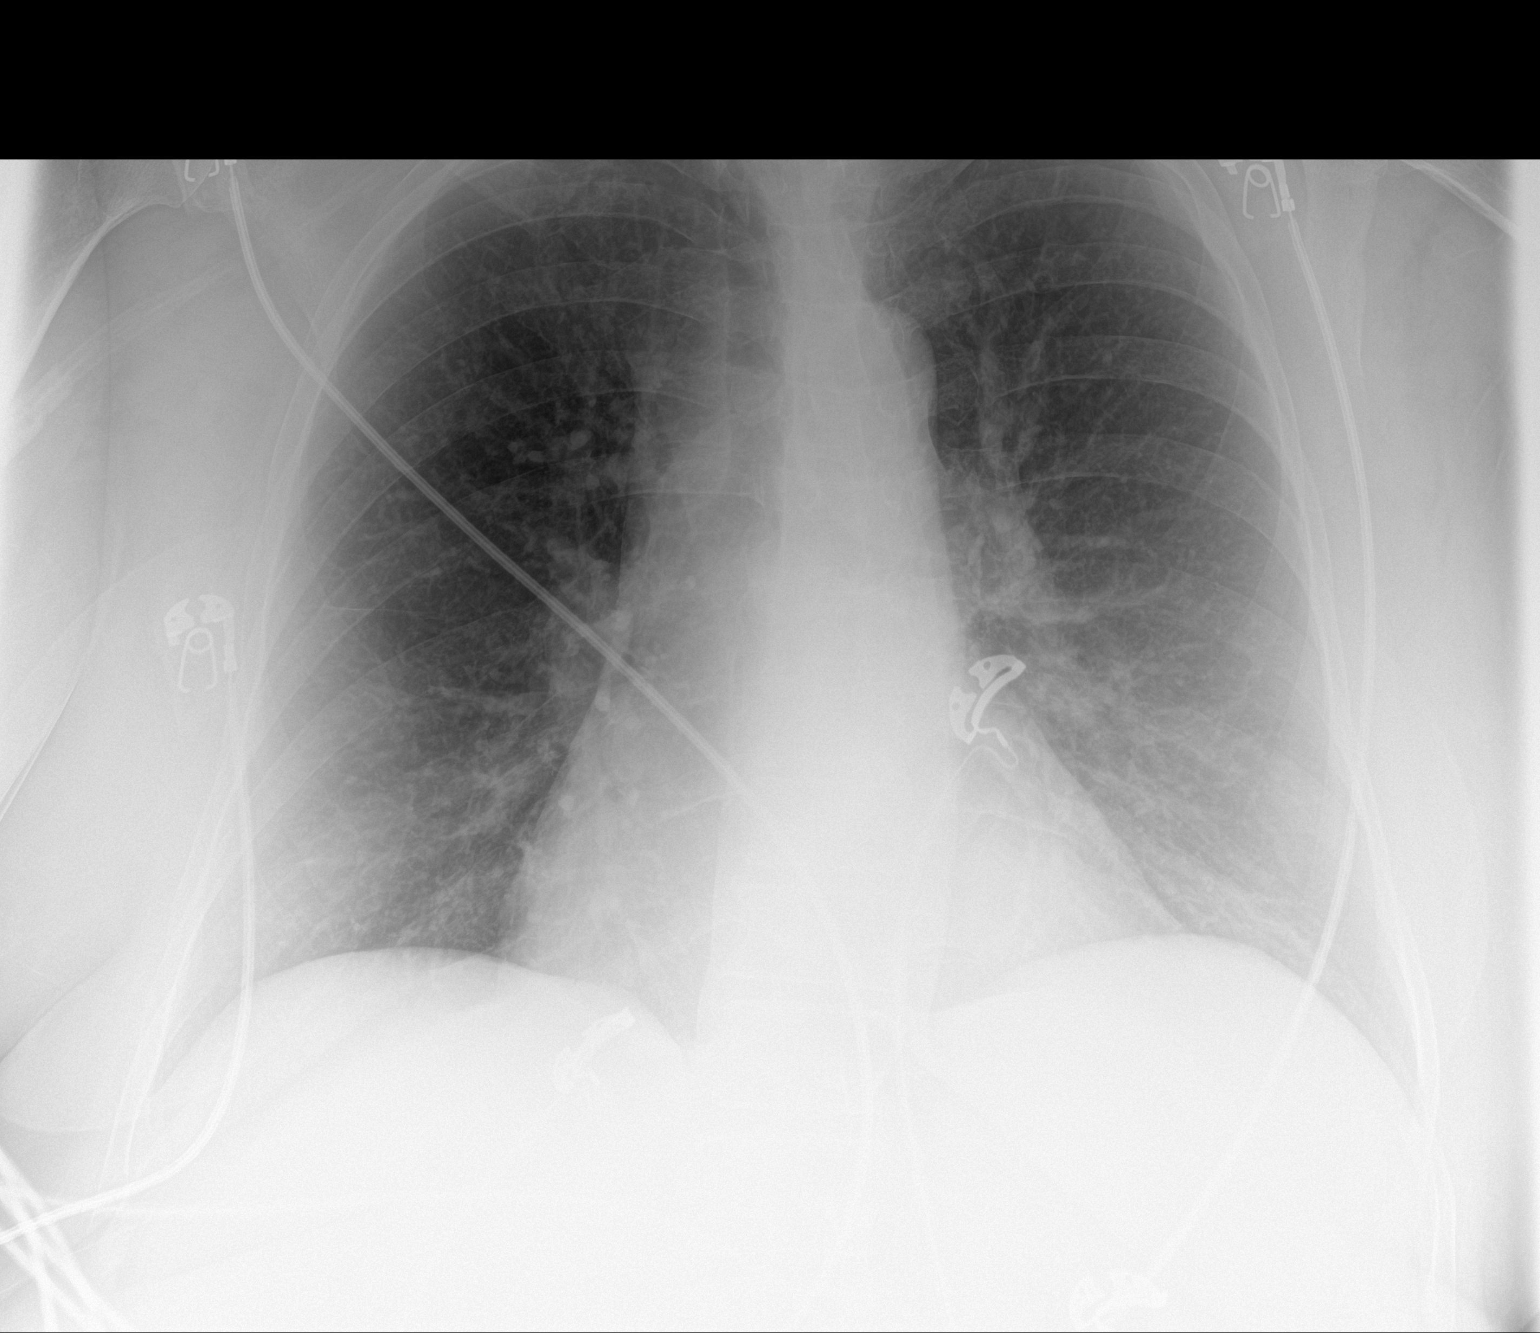

[2 of 2 positions shown; findings below may reference images not displayed]

FINDINGS: There is emphysematous changes of the lungs. No focal consolidation,
pleural effusion, or pneumothorax. The cardiac silhouette is within
normal limits. No acute osseous pathology.
IMPRESSION: No active disease.

## 2019-10-19 IMAGING — CT CT ANGIO CHEST
2 of 6 series · 18 of 36 positions shown · IV contrast (ISOVUE 370)
Comparison: Chest radiograph yesterday

CLINICAL DATA: Cough, shortness of breath. Intermediate probability
for pulmonary embolus. Elevated D-dimer.

EXAM:
CT ANGIOGRAPHY CHEST WITH CONTRAST
TECHNIQUE: Multidetector CT imaging of the chest was performed using the
standard protocol during bolus administration of intravenous
contrast. Multiplanar CT image reconstructions and MIPs were
obtained to evaluate the vascular anatomy.
CONTRAST:  100 cc Isovue 370 IV

[Series 5: thins for pacs · axial · 0.63mm/px · z∈[-142,+84]mm · 17 of 252 slices shown]
[im 13/252  lung]
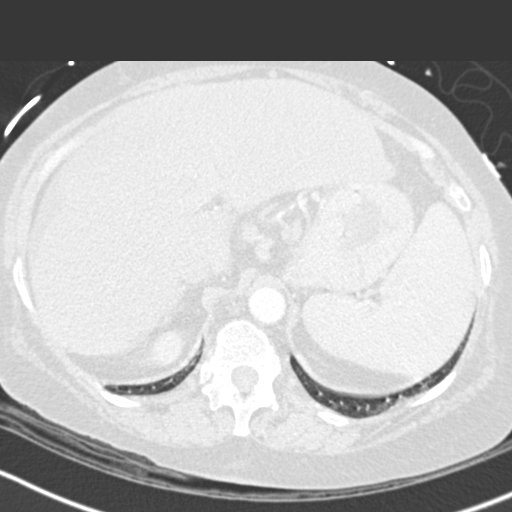
[im 26/252  mediastinal]
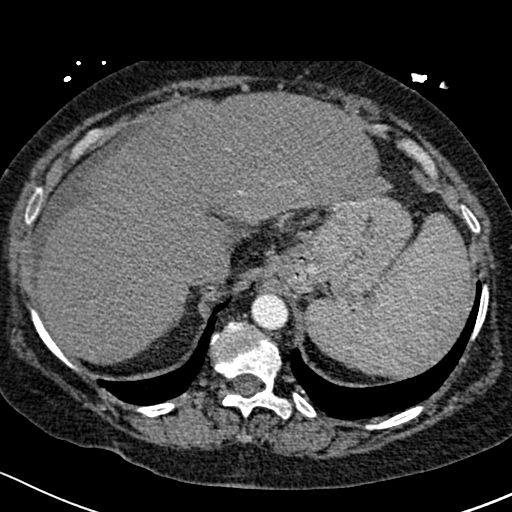
[im 38/252  lung]
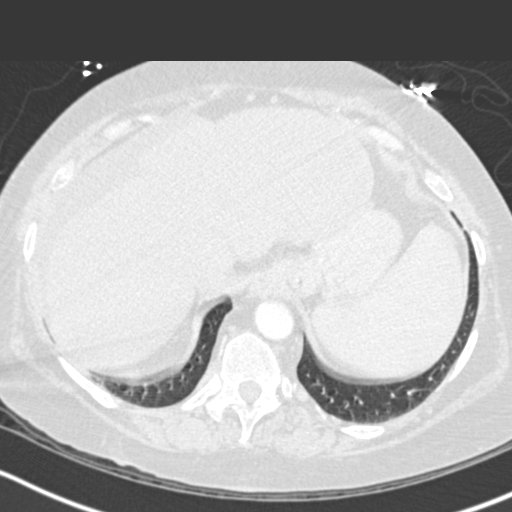
[im 51/252  mediastinal]
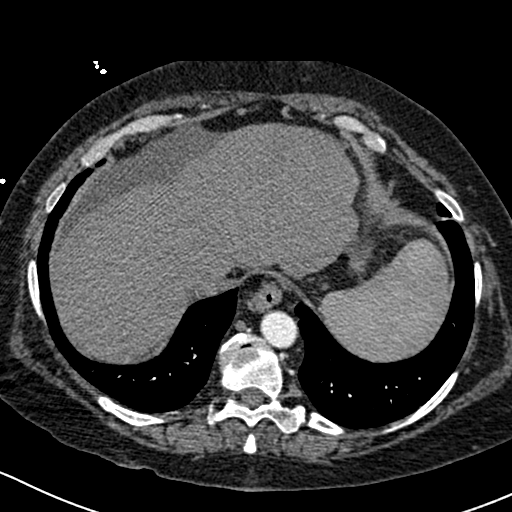
[im 76/252  lung]
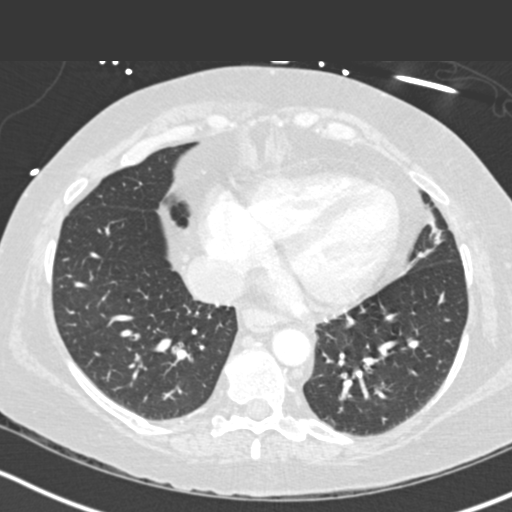
[im 88/252  mediastinal]
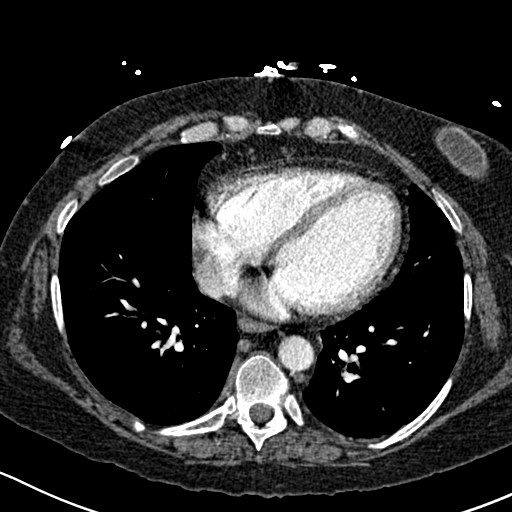
[im 101/252  lung]
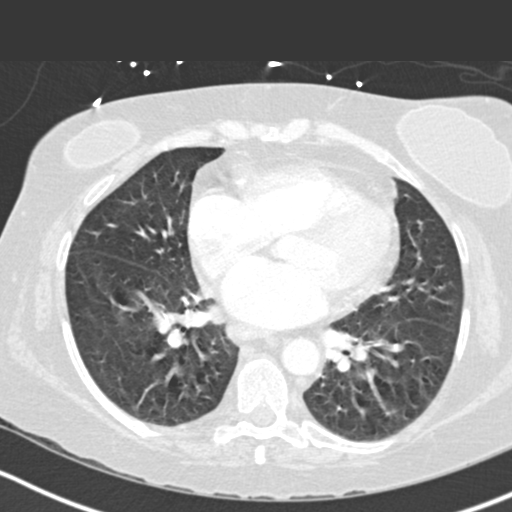
[im 113/252  mediastinal]
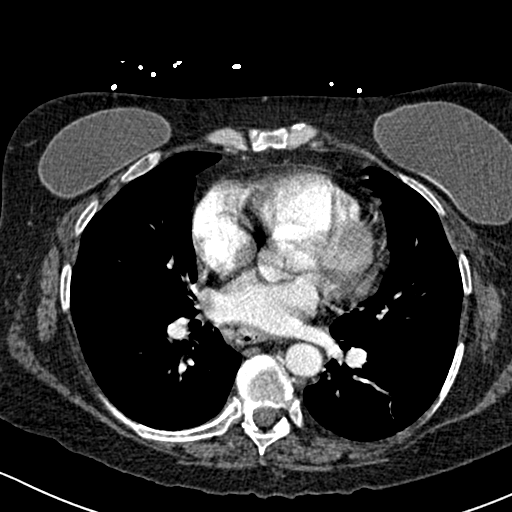
[im 126/252  lung]
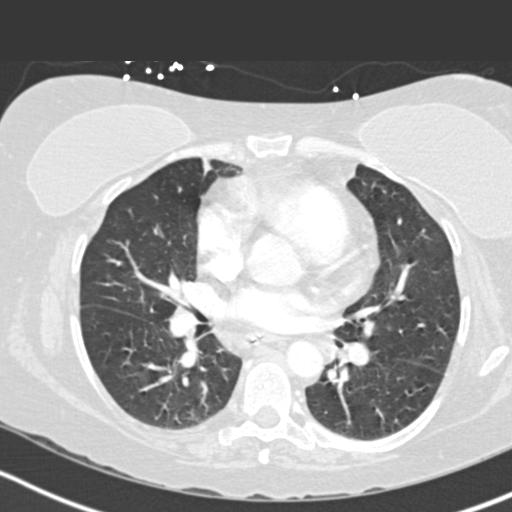
[im 139/252  mediastinal]
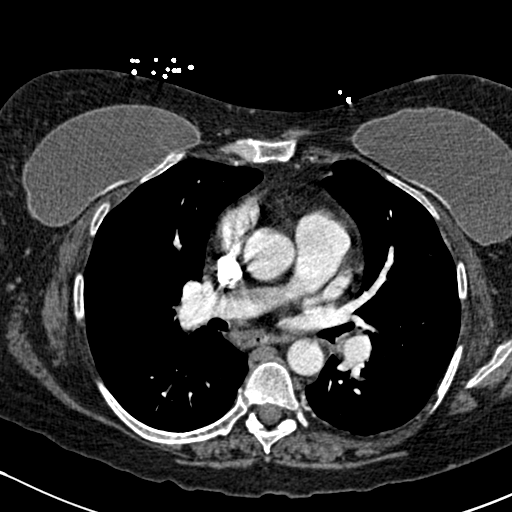
[im 151/252  lung]
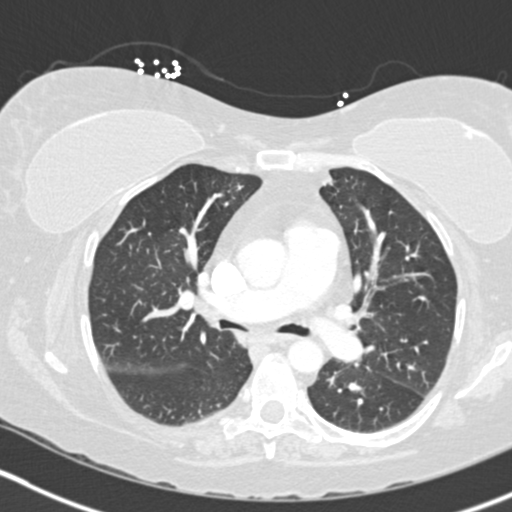
[im 164/252  mediastinal]
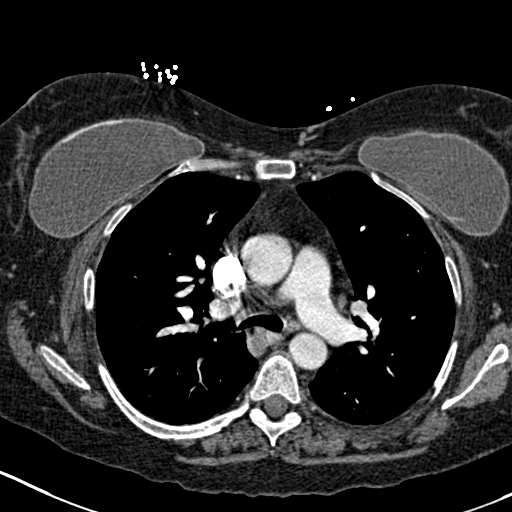
[im 176/252  lung]
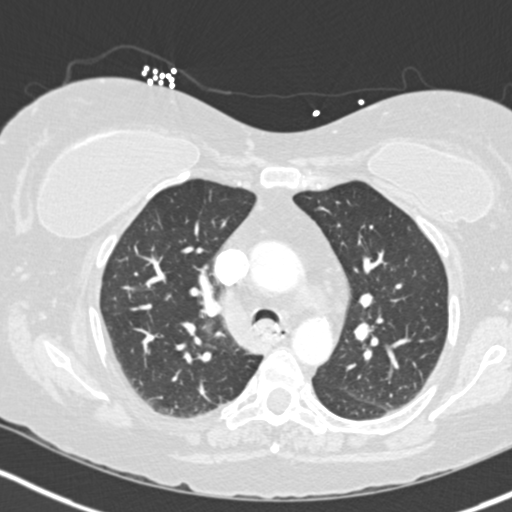
[im 201/252  mediastinal]
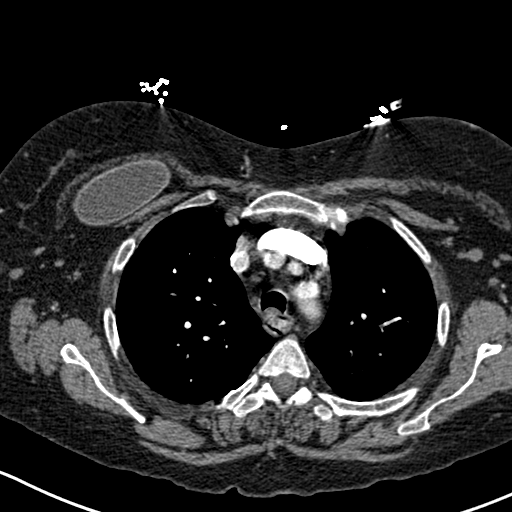
[im 214/252  lung]
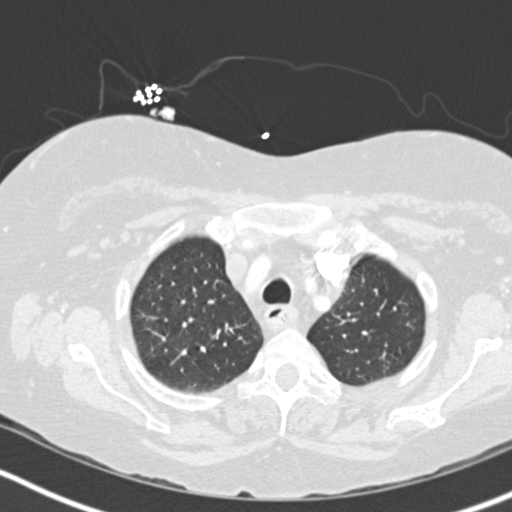
[im 226/252  mediastinal]
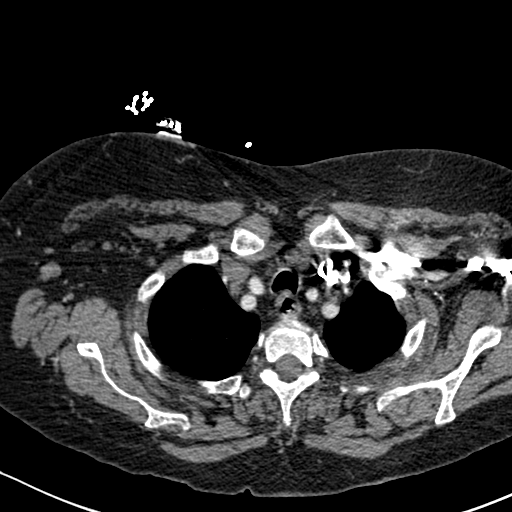
[im 239/252  lung]
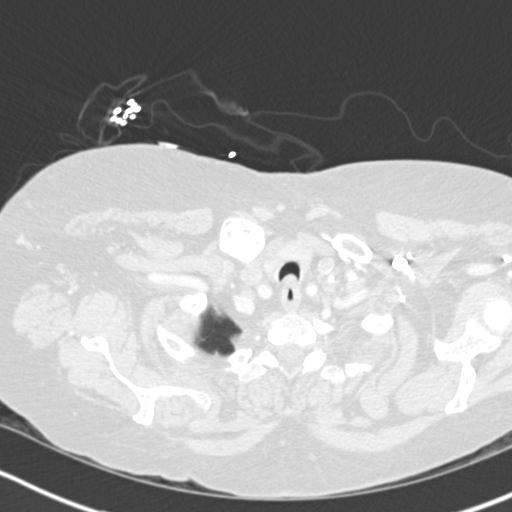

[Series 7: coronal mpr · coronal · 0.49mm/px · 1 of 134 slices shown]
[im 67/134  mediastinal]
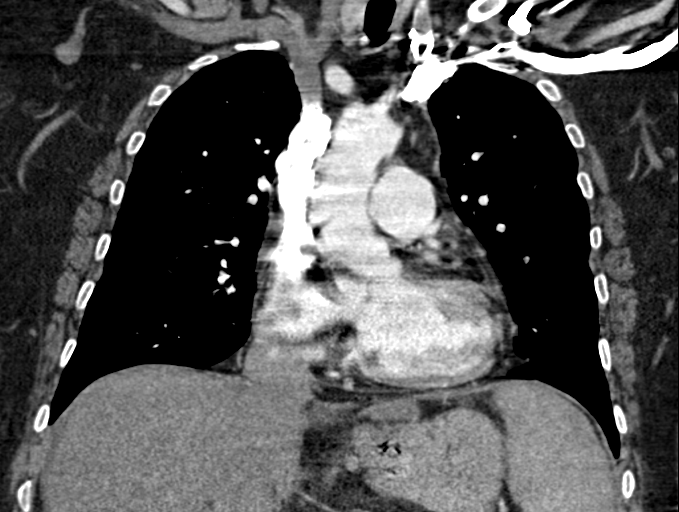

[18 of 36 positions shown; findings below may reference images not displayed]

FINDINGS: Cardiovascular: Mild breathing motion artifact, patient was asleep
and had difficulty following destruction. Allowing for this, there
are no filling defects within the pulmonary arteries to suggest
pulmonary embolus. Mild atherosclerosis of the thoracic aorta
without dissection or aneurysm. Mild multi chamber cardiomegaly. No
pericardial effusion.

Mediastinum/Nodes: No mediastinal or hilar adenopathy. Ingested
material in the midesophagus. Visualized thyroid gland is normal. No
axillary adenopathy. Bilateral breast prosthesis noted.

Lungs/Pleura: Lingular atelectasis or scarring. No consolidation. No
pulmonary edema. No pleural fluid. No pulmonary mass. Breathing
motion artifact partially obscures evaluation for pulmonary nodules.

Upper Abdomen: Cirrhotic liver with small amount perihepatic
ascites. Prominent spleen partially included.

Musculoskeletal: There are no acute or suspicious osseous
abnormalities. Remote medial right clavicle fracture with probable
nonunion.

Review of the MIP images confirms the above findings.
IMPRESSION: 1. No pulmonary embolus.
2. Ingested material in the esophagus suggesting delayed esophageal
transit, less likely reflux.

Aortic Atherosclerosis ([id number]-7X0.0).

## 2019-10-31 IMAGING — CT CT ABD-PELV W/ CM
2 of 5 series · 15 of 46 positions shown, 17 images · IV contrast (isovue)
Comparison: CT of the abdomen and pelvis from 10/15/2015

CLINICAL DATA: Acute onset of generalized abdominal pain after
drinking a bottle of wine. Current history of hepatic cirrhosis.

EXAM:
CT ABDOMEN AND PELVIS WITH CONTRAST
TECHNIQUE: Multidetector CT imaging of the abdomen and pelvis was performed
using the standard protocol following bolus administration of
intravenous contrast.
CONTRAST:  100 mL of Isovue 300 IV contrast

[Series 2: abd/pel with · axial · 0.76mm/px · z∈[-414,-44]mm · 12 of 86 slices shown, 14 images]
[im 6/86  soft-tissue]
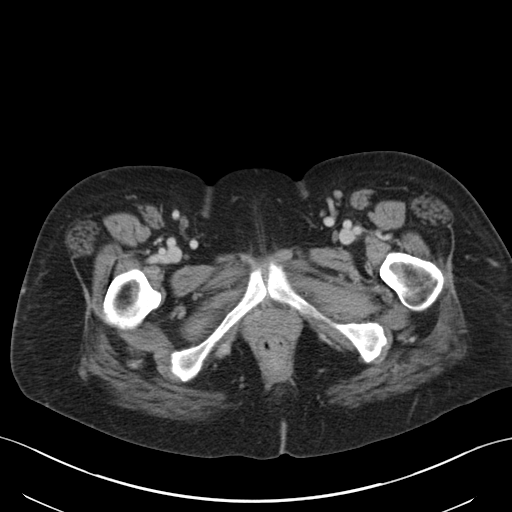
[im 6/86  bone]
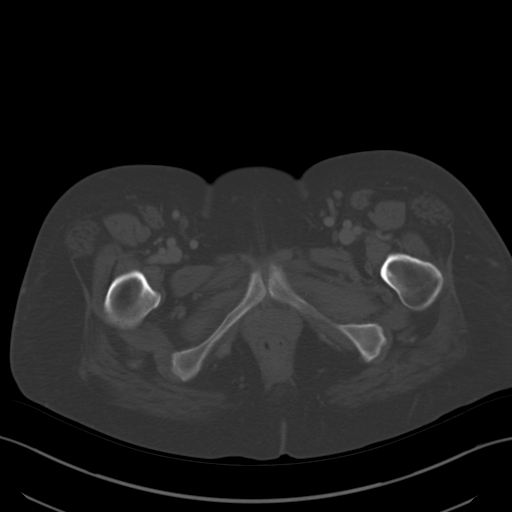
[im 12/86  soft-tissue]
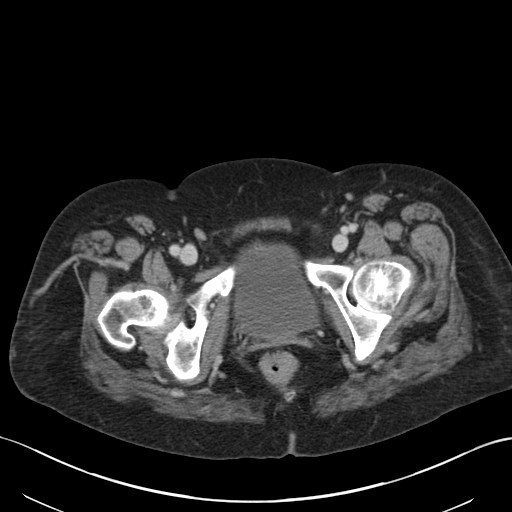
[im 18/86  soft-tissue]
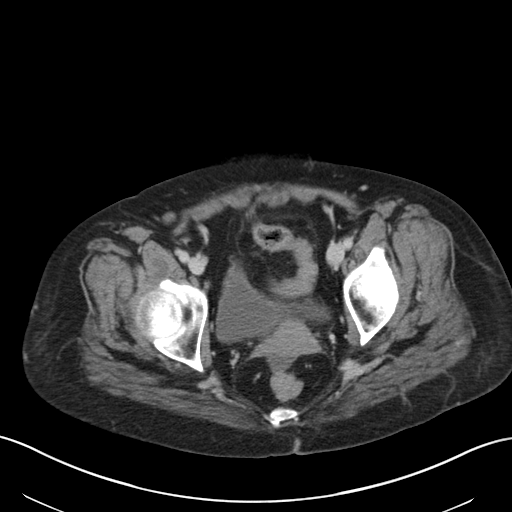
[im 29/86  soft-tissue]
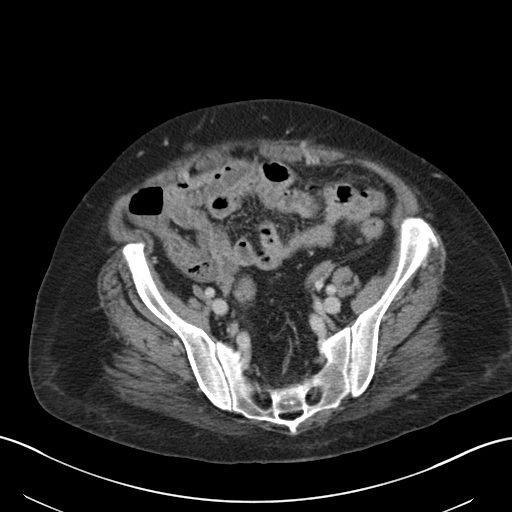
[im 35/86  soft-tissue]
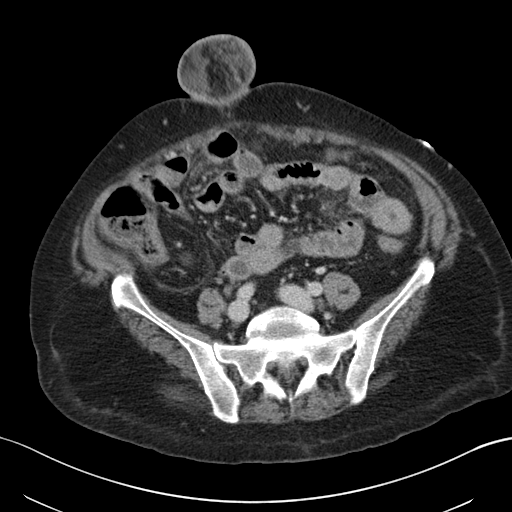
[im 40/86  soft-tissue]
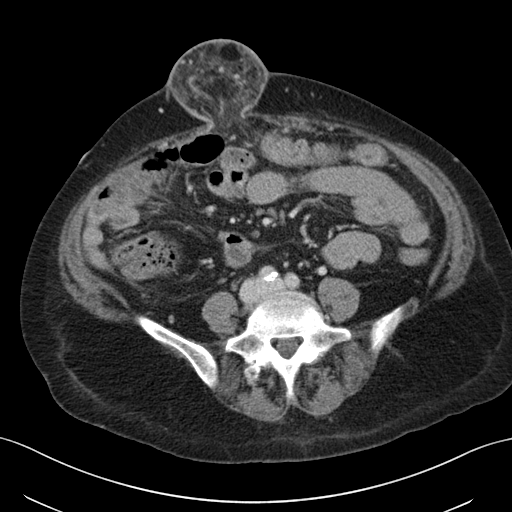
[im 46/86  soft-tissue]
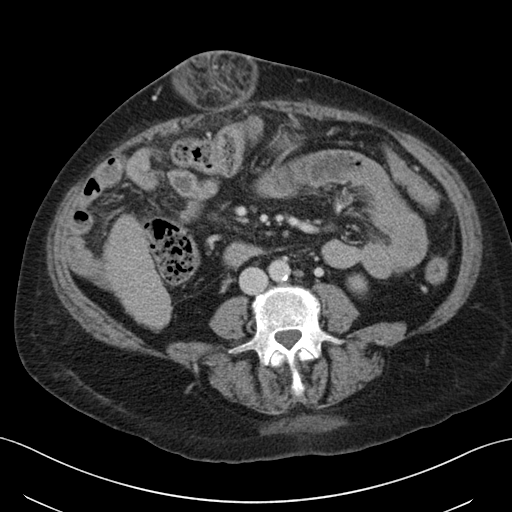
[im 52/86  soft-tissue]
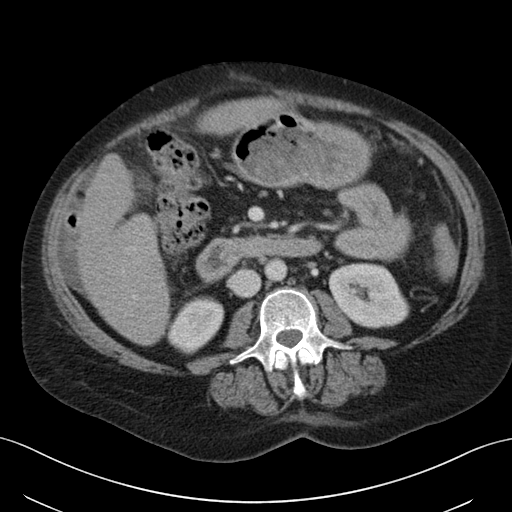
[im 57/86  soft-tissue]
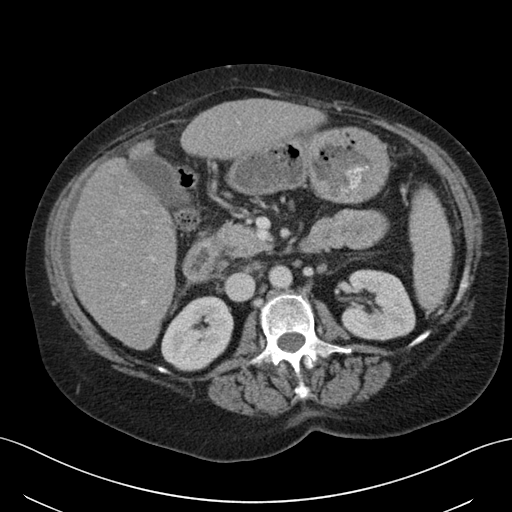
[im 57/86  bone]
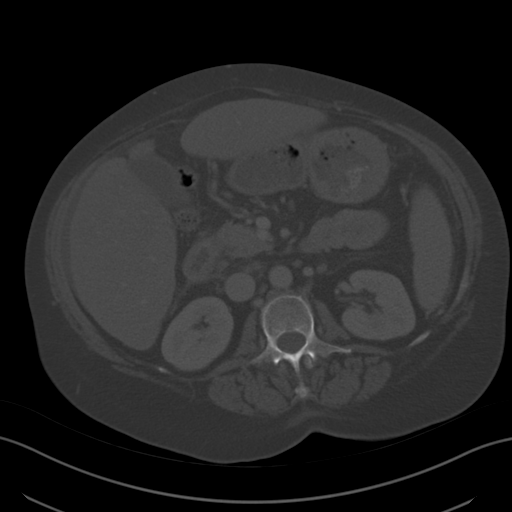
[im 69/86  soft-tissue]
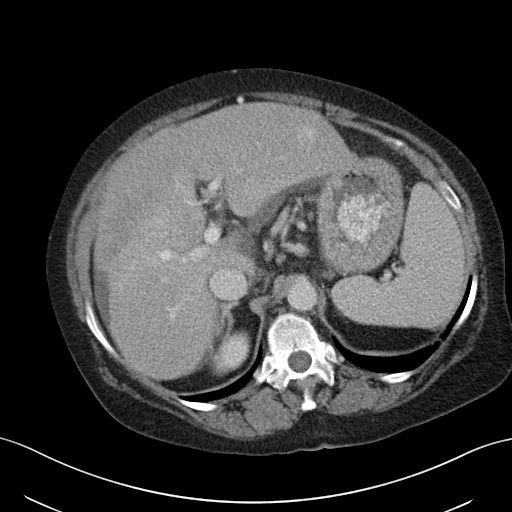
[im 74/86  soft-tissue]
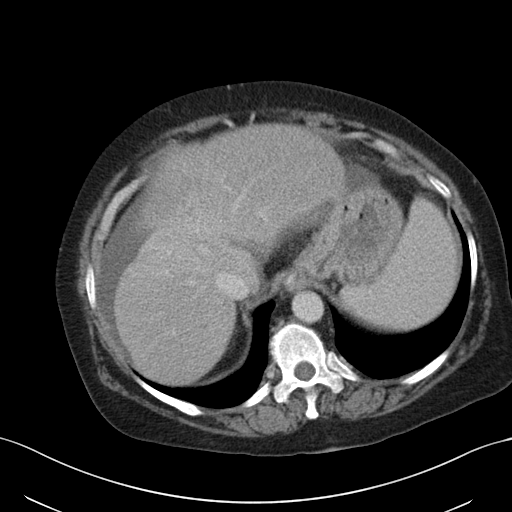
[im 80/86  soft-tissue]
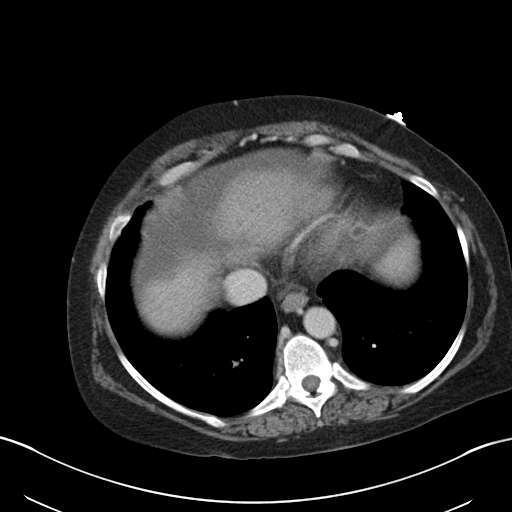

[Series 4: coronal a/|p · coronal · 0.70mm/px · 3 of 159 slices shown]
[im 53/159  soft-tissue]
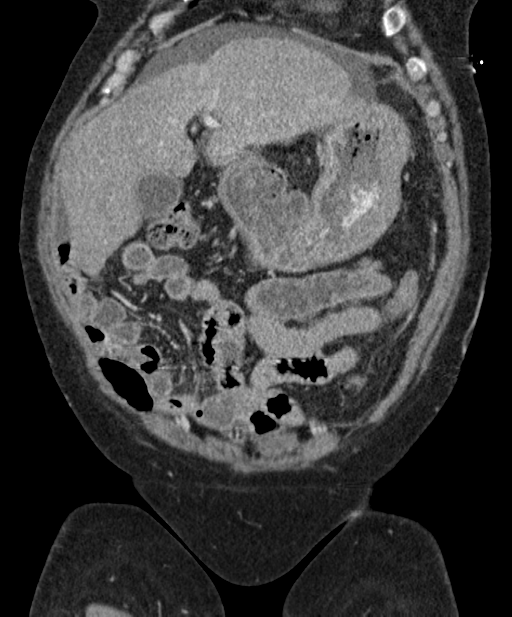
[im 71/159  soft-tissue]
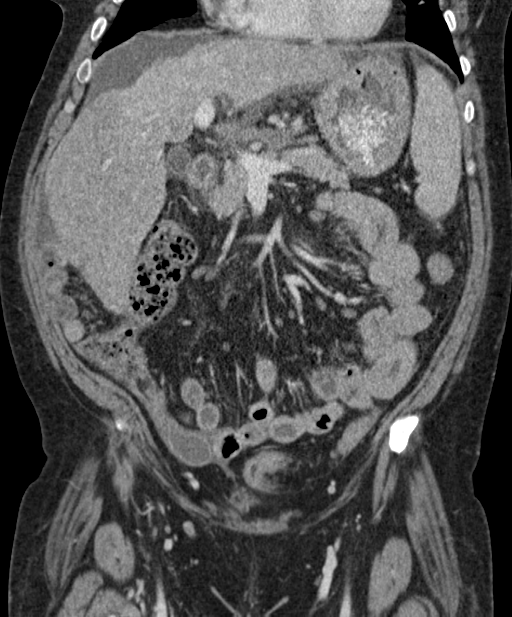
[im 88/159  soft-tissue]
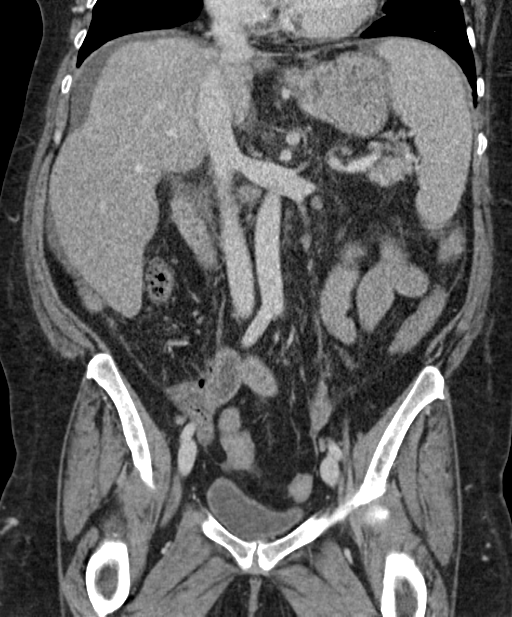

[15 of 46 positions shown; findings below may reference images not displayed]

FINDINGS: Lower chest: Mild scarring is noted at the left lingula. The lung
bases are otherwise clear. The visualized portions of the
mediastinum are unremarkable.

Hepatobiliary: Previously noted diffuse fatty infiltration within
the liver is less apparent. A vague focus of contrast blush is noted
at the left hepatic lobe. Changes of hepatic cirrhosis are again
noted. The gallbladder is difficult to fully assess due to
underlying ascites. Small volume ascites is seen tracking about the
liver.

The common bile duct remains normal in caliber. Mild gastric and
esophageal varices are noted.

Pancreas: The pancreas is within normal limits.

Spleen: The spleen is unremarkable in appearance.

Adrenals/Urinary Tract: The adrenal glands are unremarkable in
appearance. The kidneys are within normal limits. There is no
evidence of hydronephrosis. No renal or ureteral stones are
identified. No perinephric stranding is seen.

Stomach/Bowel: The stomach is unremarkable in appearance. The small
bowel is within normal limits. The appendix is normal in caliber,
without evidence of appendicitis. The colon is unremarkable in
appearance.

Vascular/Lymphatic: Scattered calcification is seen along the
abdominal aorta and its branches. The abdominal aorta is otherwise
grossly unremarkable. The inferior vena cava is grossly
unremarkable. No retroperitoneal lymphadenopathy is seen. No pelvic
sidewall lymphadenopathy is identified.

Reproductive: The bladder is mildly distended and grossly
unremarkable. The uterus is unremarkable. The ovaries are relatively
symmetric. No suspicious adnexal masses are seen.

Other: A prominent moderate anterior abdominal wall hernia is noted,
new from 7263, with associated soft tissue edema. There is no
evidence of bowel herniation at this time.

Musculoskeletal: No acute osseous abnormalities are identified. The
visualized musculature is unremarkable in appearance.
IMPRESSION: 1. Changes of hepatic cirrhosis again noted. Mild gastric and
esophageal varices seen. Nonspecific focus of contrast blush again
seen at the left hepatic lobe.
2. Small volume ascites noted tracking about the liver.
3. Prominent moderate anterior abdominal wall hernia, new from 7263,
with associated diffuse soft tissue edema. No evidence of bowel
herniation at this time.
4. Mild scarring at the left lingula.
5.  Aortic Atherosclerosis (HHIHM-C78.8).

## 2019-10-31 IMAGING — DX DG CHEST 1V PORT
1 series · 1 of 1 positions shown · non-contrast
Comparison: Chest radiograph performed 12/13/2016

CLINICAL DATA: Acute onset of shortness of breath.

EXAM:
PORTABLE CHEST 1 VIEW

[chest ap]
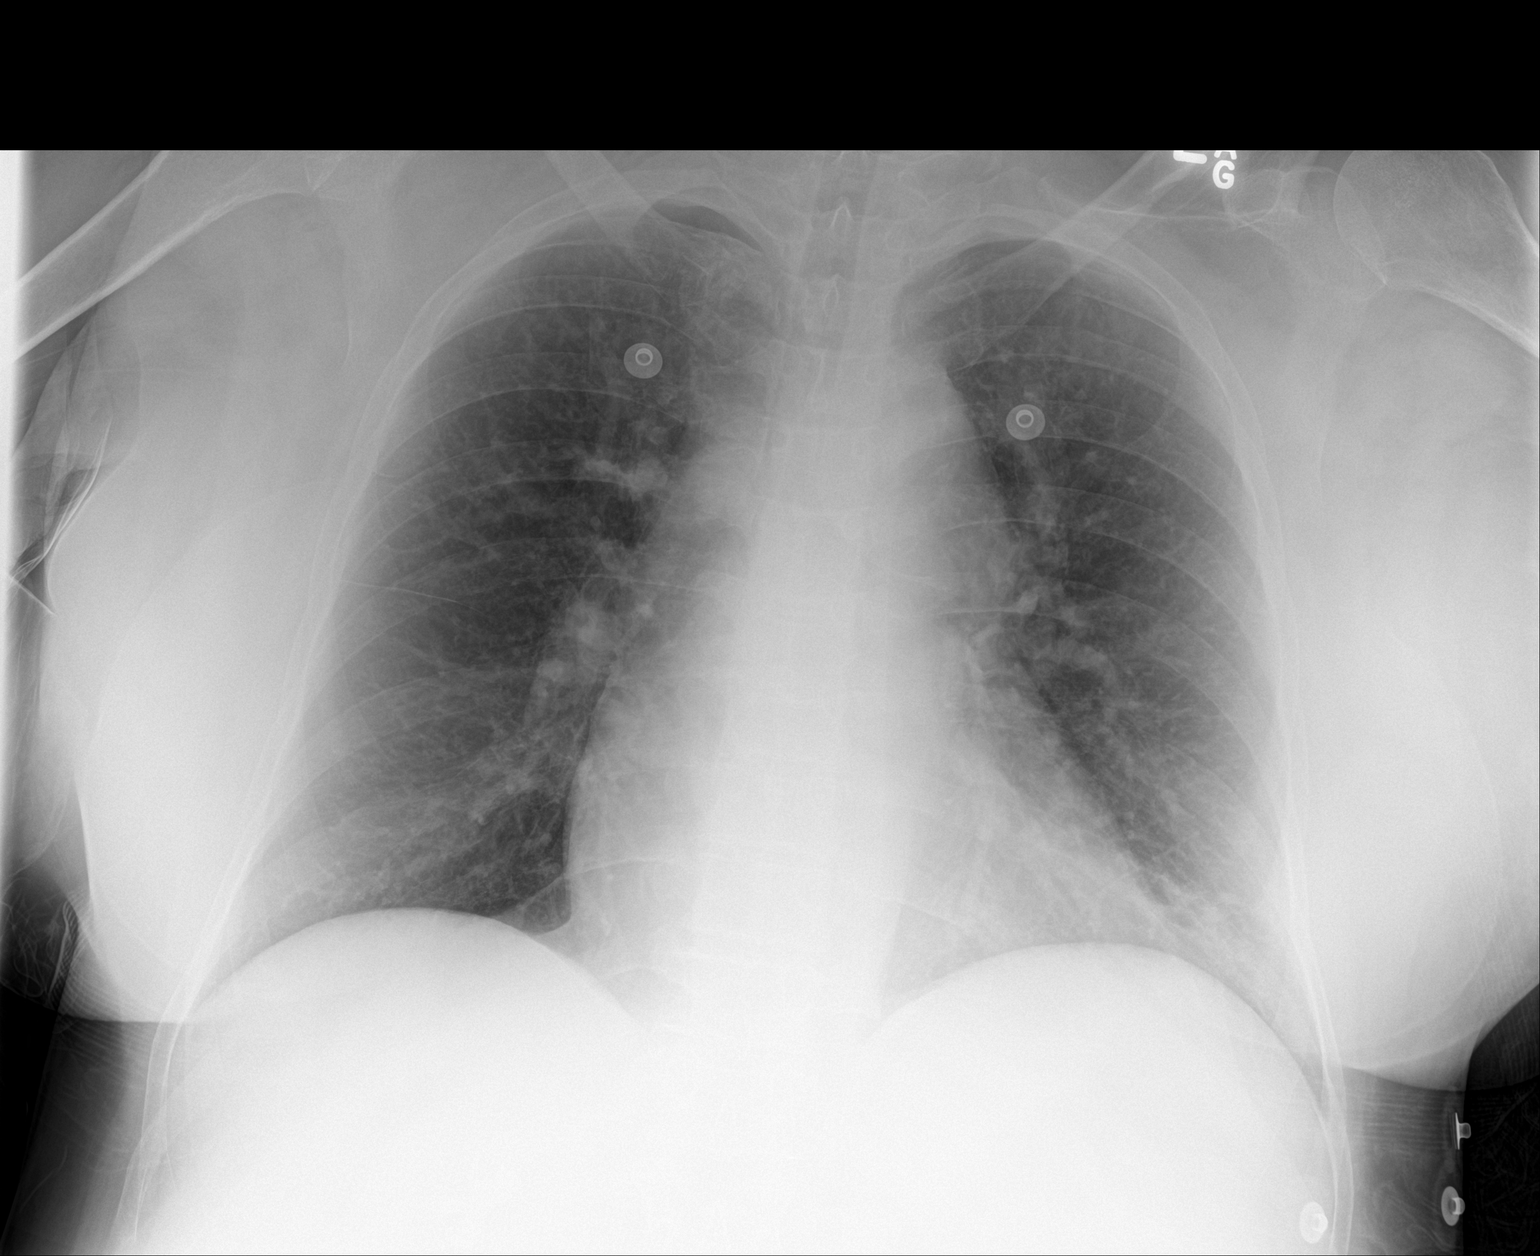

[1 of 1 positions shown; findings below may reference images not displayed]

FINDINGS: The lungs are well-aerated. Mild left basilar airspace opacity
raises concern for pneumonia. There is no evidence of pleural
effusion or pneumothorax.

The cardiomediastinal silhouette is borderline normal in size. No
acute osseous abnormalities are seen.
IMPRESSION: Mild left basilar airspace opacity raises concern for pneumonia.

## 2019-11-09 IMAGING — DX DG CHEST 1V PORT
1 series · 1 of 1 positions shown · non-contrast
Comparison: December 21, 2016

CLINICAL DATA: Hypoxia

EXAM:
PORTABLE CHEST 1 VIEW

[chest ap]
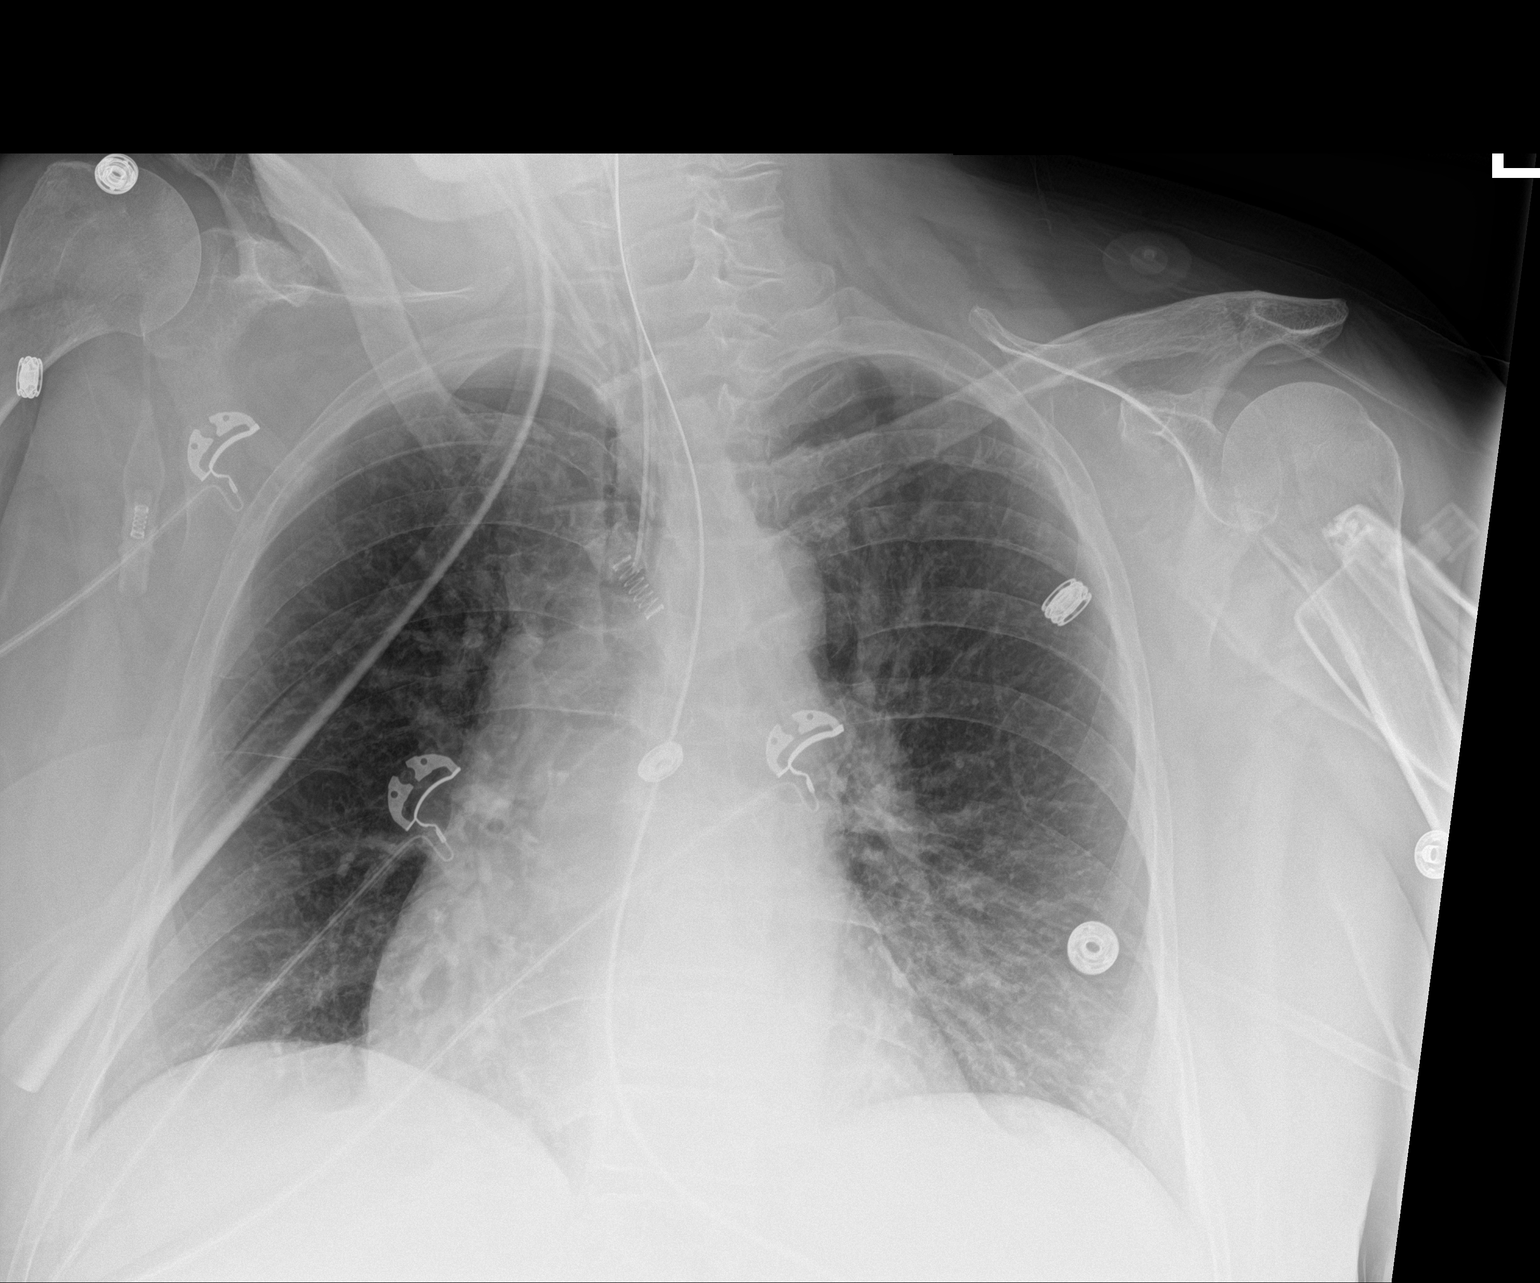

[1 of 1 positions shown; findings below may reference images not displayed]

FINDINGS: Endotracheal tube tip is 2.9 cm above the carina. Nasogastric tube
tip and side port are below the diaphragm. No pneumothorax. There is
no edema or consolidation. The heart is upper normal in size with
pulmonary vascularity within normal limits. No adenopathy. No bone
lesions.
IMPRESSION: Tube positions as described without pneumothorax. No edema or
consolidation. Stable cardiac silhouette.

## 2019-11-09 IMAGING — US US ABDOMEN LIMITED
1 series · 14 of 25 positions shown · non-contrast
Comparison: CT abdomen and pelvis December 21, 2016

CLINICAL DATA: Right upper quadrant pain

EXAM:
ULTRASOUND ABDOMEN LIMITED RIGHT UPPER QUADRANT

[Series 1: us abdomen limited · 0.22mm/px · 14 of 45 slices shown]
[im 1/45]
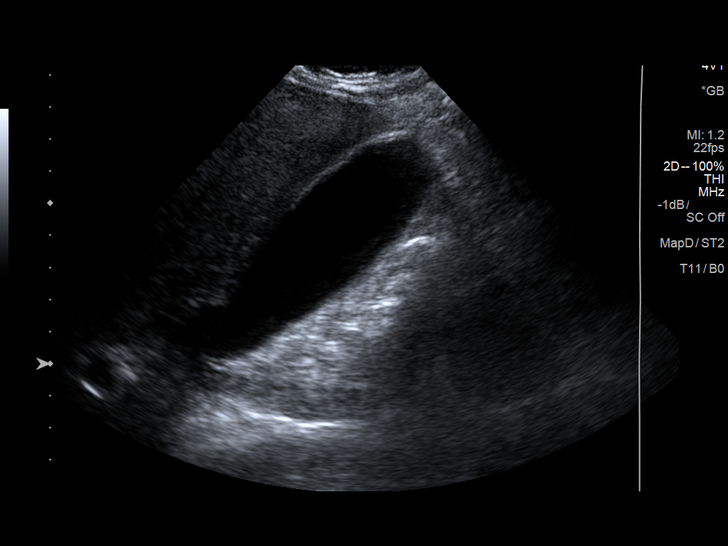
[im 4/45]
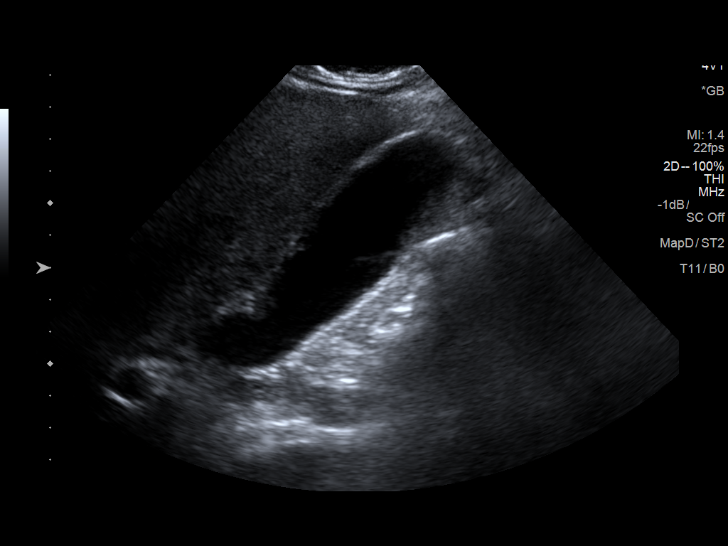
[im 8/45]
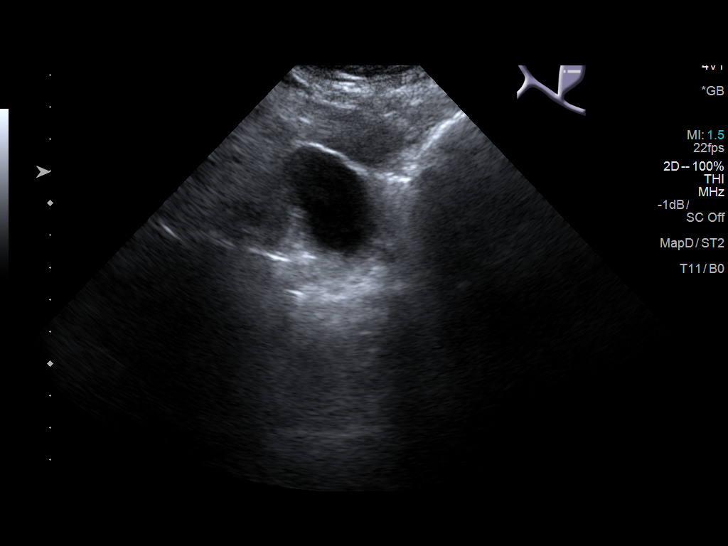
[im 12/45]
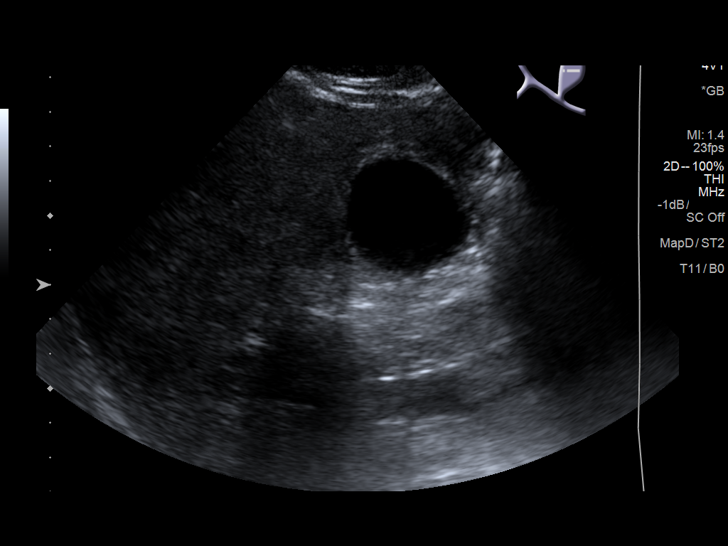
[im 15/45]
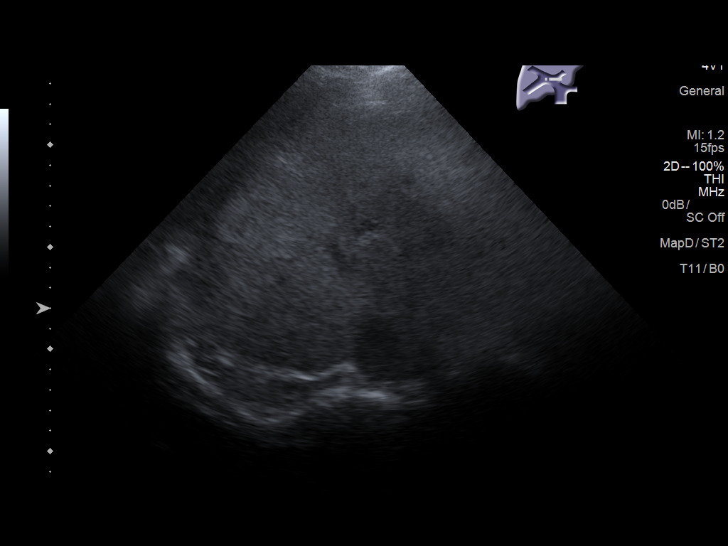
[im 17/45]
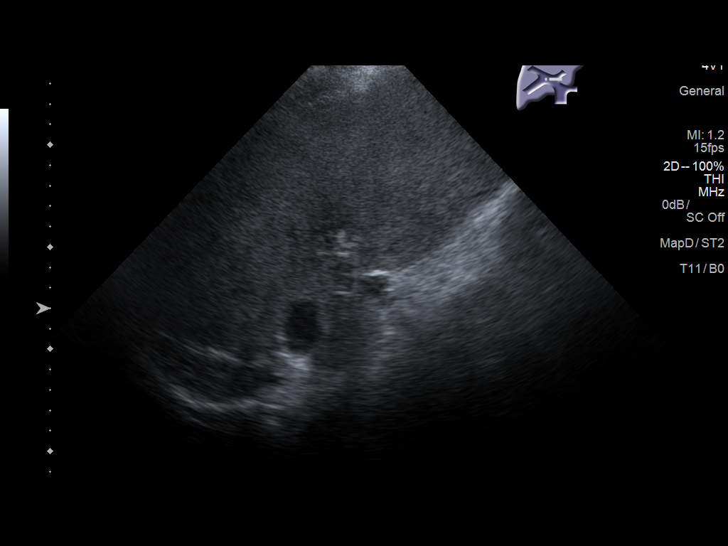
[im 21/45]
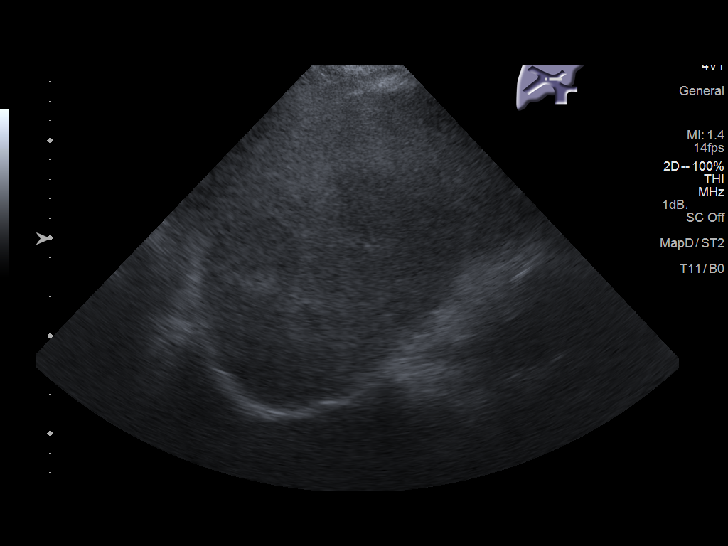
[im 24/45]
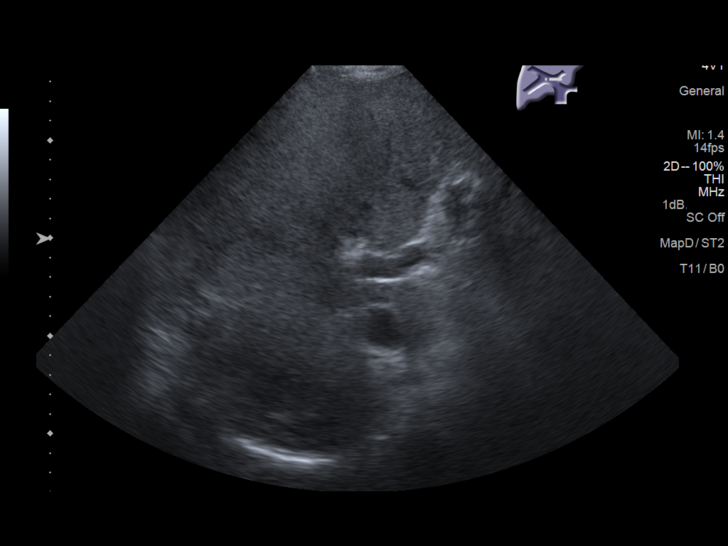
[im 28/45]
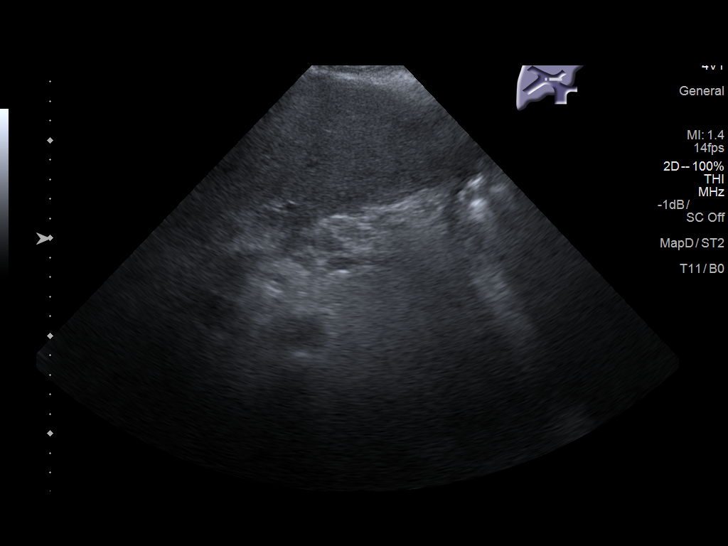
[im 30/45]
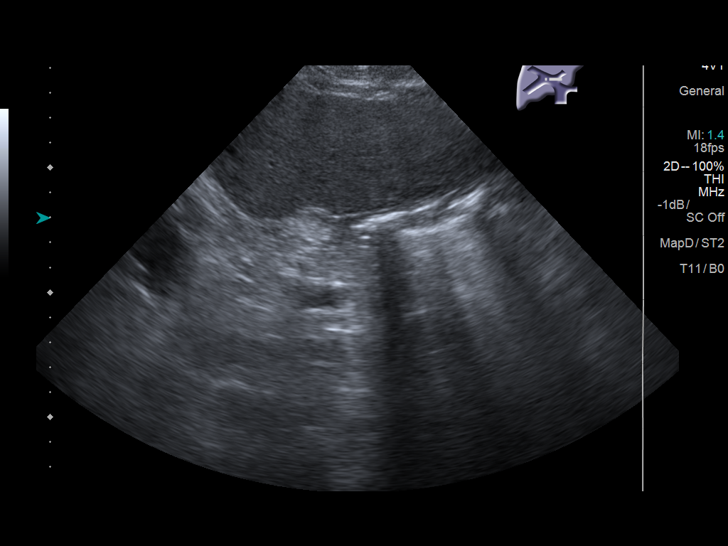
[im 34/45]
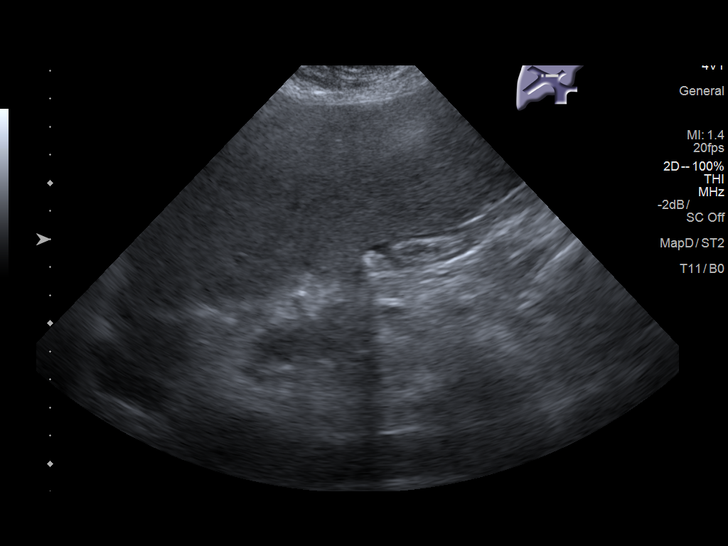
[im 37/45]
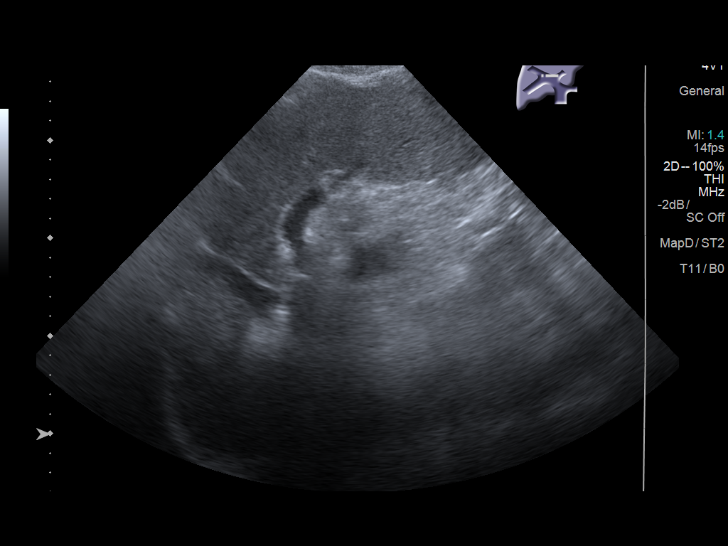
[im 41/45]
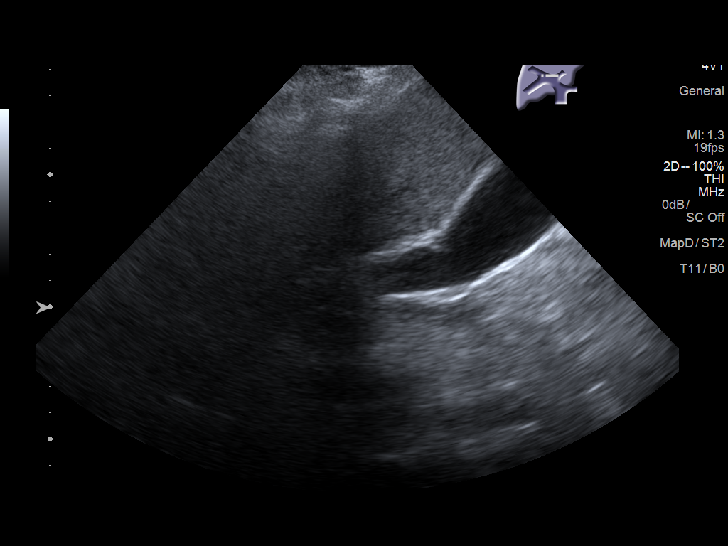
[im 45/45]
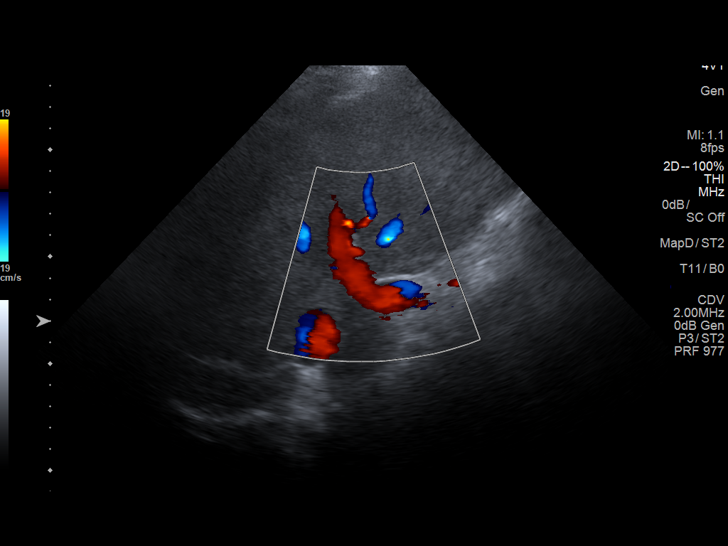

[14 of 25 positions shown; findings below may reference images not displayed]

FINDINGS: Gallbladder:

No gallstones or wall thickening visualized. There is no
pericholecystic fluid. No sonographic Murphy sign noted by
sonographer.

Common bile duct:

Diameter: 2 mm. No intrahepatic or extrahepatic biliary duct
dilatation.

Liver:

No focal lesion identified. Liver echogenicity overall is increased.
Portal vein is patent on color Doppler imaging with normal direction
of blood flow towards the liver.
IMPRESSION: Increase in liver echogenicity, a finding indicative of hepatic
steatosis with potential underlying parenchymal liver disease. While
no focal liver lesions are evident on this study, it must be
cautioned that the sensitivity of ultrasound for detection of focal
liver lesions is diminished in this circumstance.

Study otherwise unremarkable.

## 2019-11-10 IMAGING — DX DG CHEST 1V PORT
1 series · 1 of 1 positions shown · non-contrast
Comparison: 12/30/2016 .

CLINICAL DATA: Respiratory failure with hypoxia.

EXAM:
PORTABLE CHEST 1 VIEW

[chest]
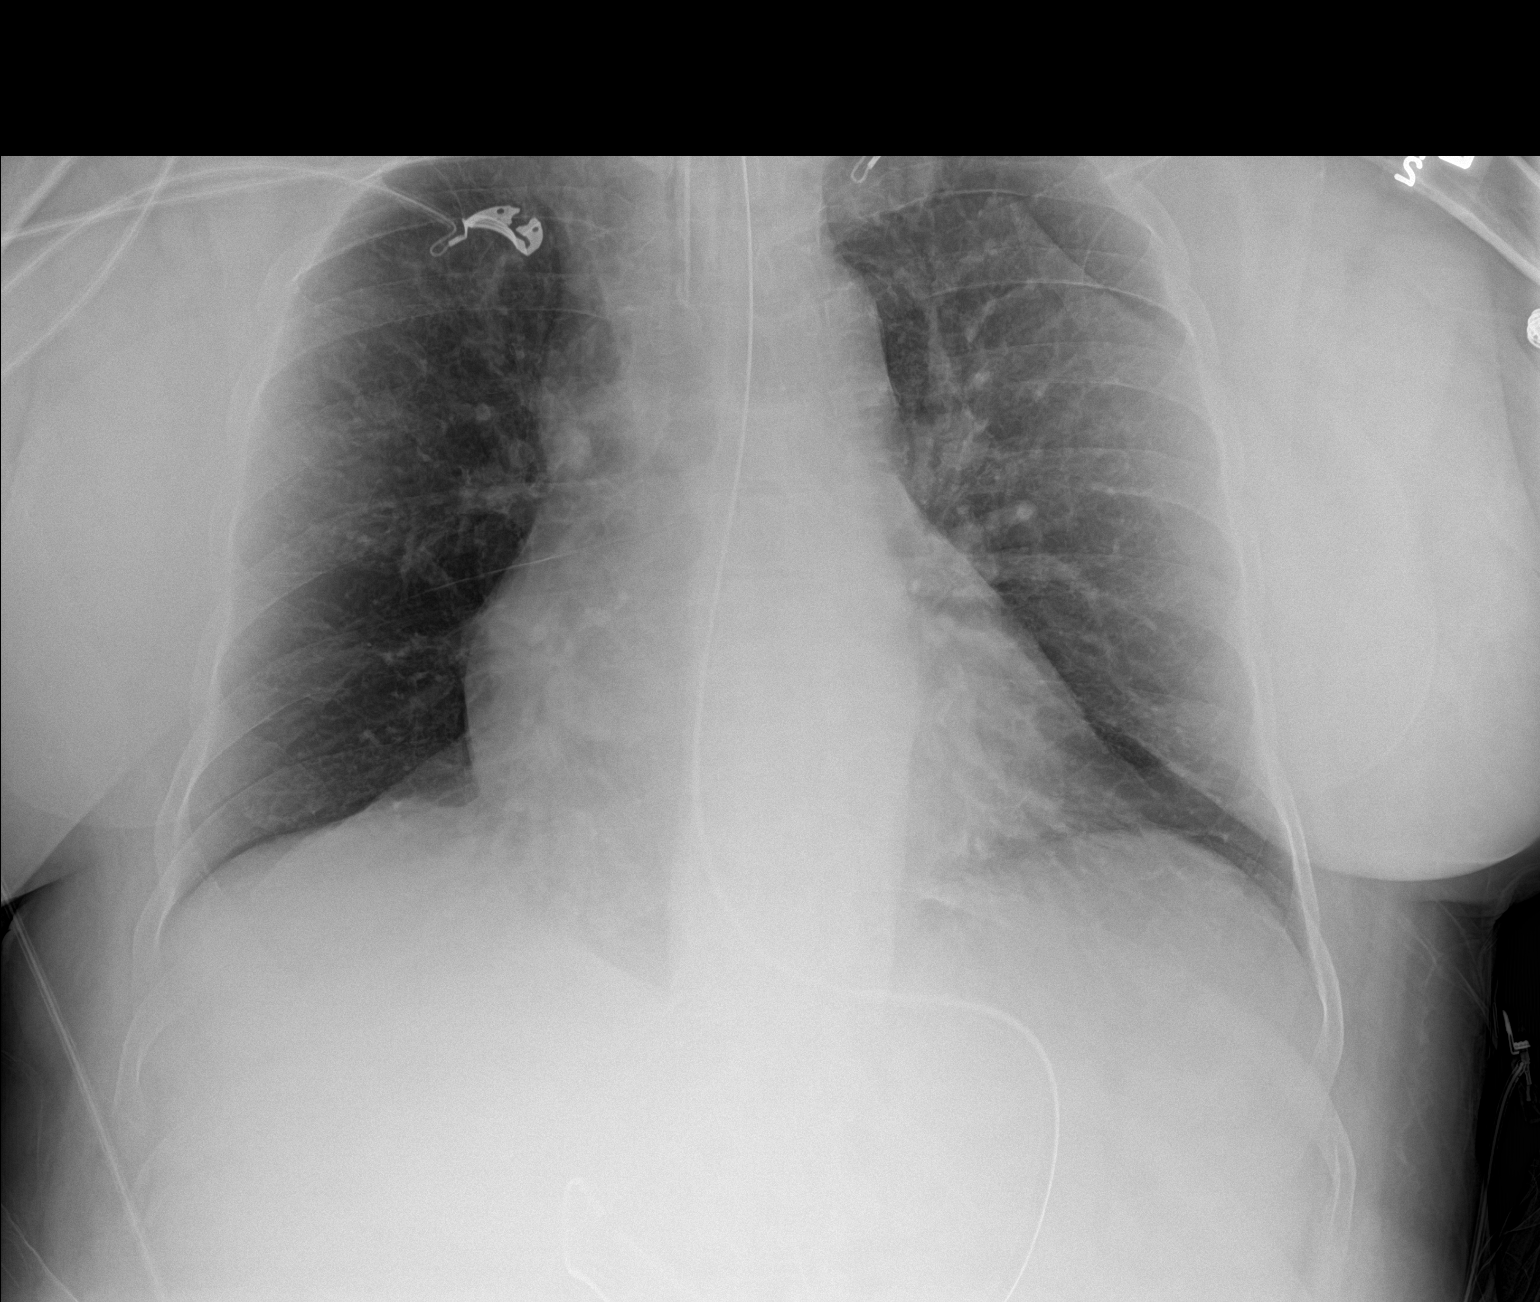

[1 of 1 positions shown; findings below may reference images not displayed]

FINDINGS: Endotracheal tube and NG tube in stable position. Heart size stable.
Lungs are clear. No pleural effusion or pneumothorax.
IMPRESSION: 1. Lines and tubes in stable position.

2.  Low lung volumes.  No acute infiltrate.  Heart size stable.

## 2019-11-12 IMAGING — DX DG CHEST 1V PORT
1 series · 1 of 1 positions shown · non-contrast
Comparison: 12/31/2016

CLINICAL DATA: Respiratory failure.

EXAM:
PORTABLE CHEST 1 VIEW

[chest]
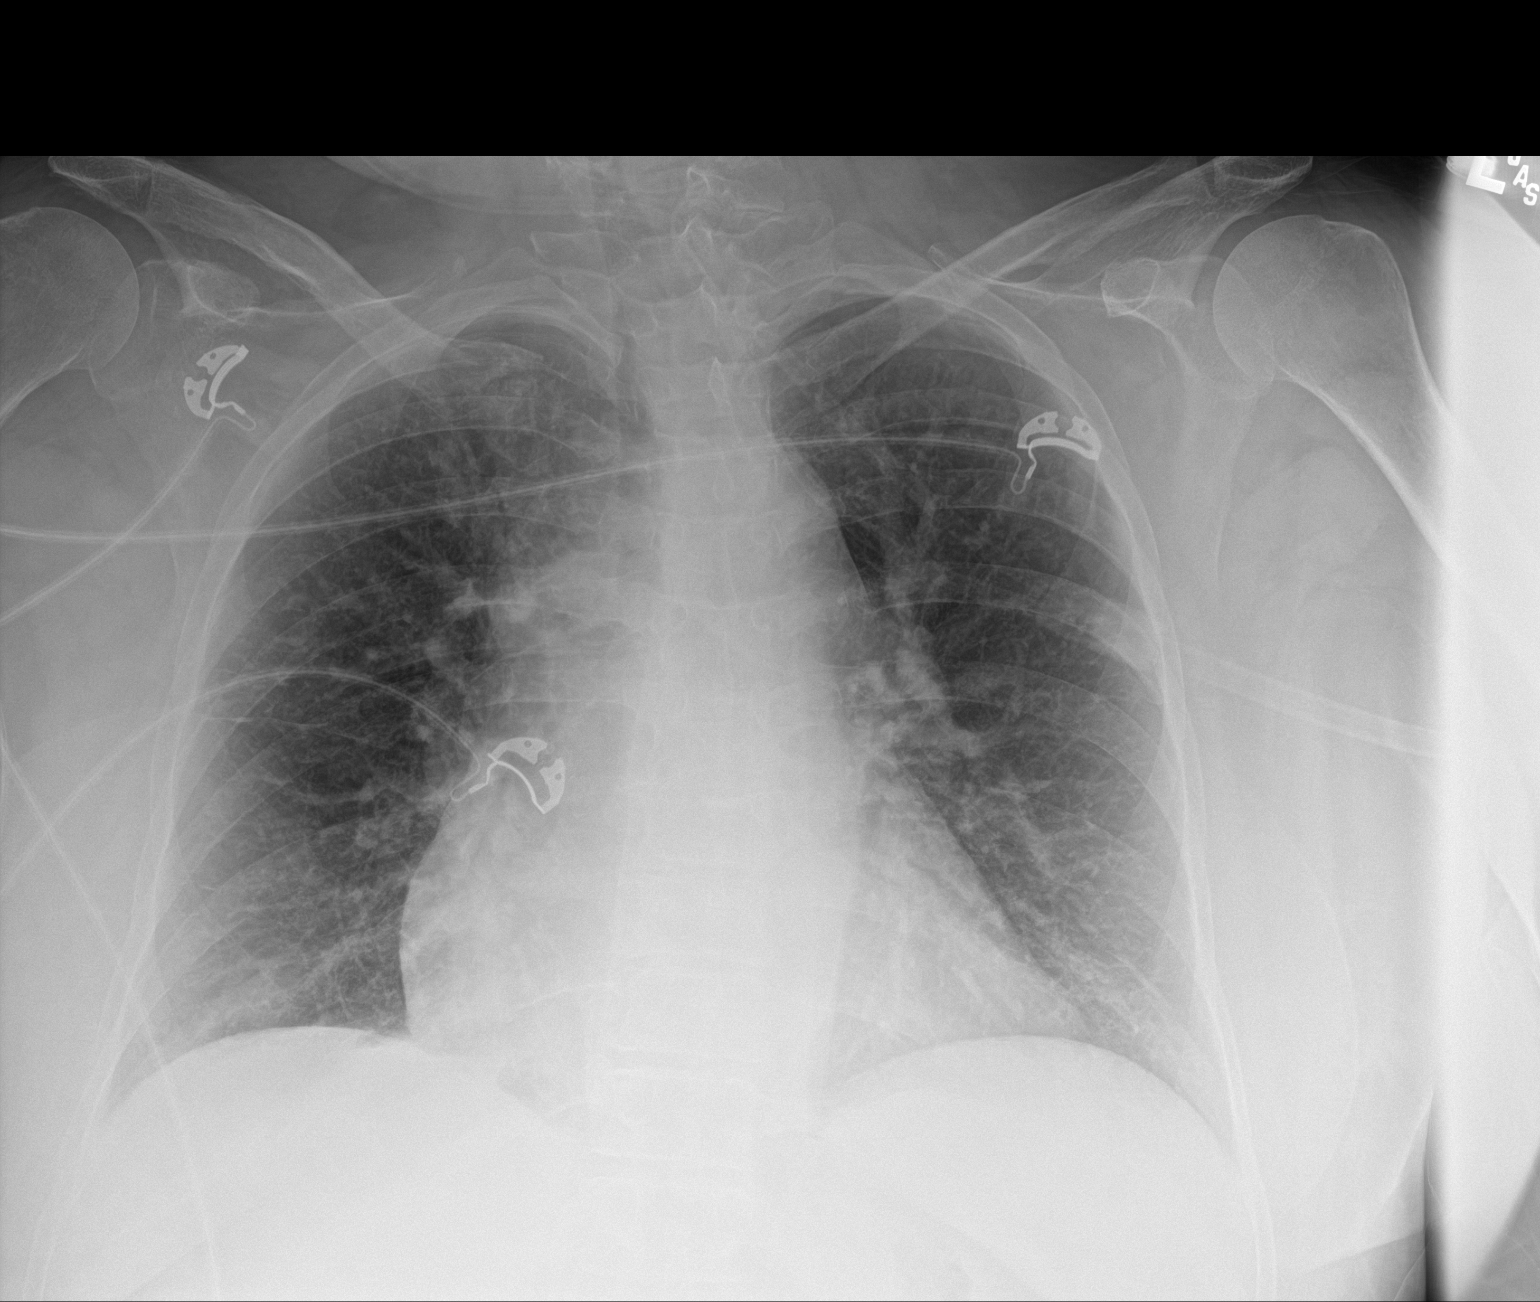

[1 of 1 positions shown; findings below may reference images not displayed]

FINDINGS: Endotracheal and enteric tubes have been removed. The
cardiomediastinal silhouette is unchanged. There is minimal new
opacity in both lung bases. No sizable pleural effusion or
pneumothorax is identified.
IMPRESSION: Minimal bibasilar opacity, likely atelectasis.

## 2019-11-28 IMAGING — CT CT ANGIO CHEST
2 of 6 series · 18 of 36 positions shown · IV contrast (Omni 300)
Comparison: Chest CT angiogram December 09, 2016; chest radiograph
January 02, 2017

CLINICAL DATA: Chest pain and shortness of breath

EXAM:
CT ANGIOGRAPHY CHEST WITH CONTRAST
TECHNIQUE: Multidetector CT imaging of the chest was performed using the
standard protocol during bolus administration of intravenous
contrast. Multiplanar CT image reconstructions and MIPs were
obtained to evaluate the vascular anatomy.
CONTRAST:  40 mL IEXE2K-STC IOPAMIDOL (IEXE2K-STC) INJECTION 76%

[Series 7: pe thins · axial · 0.70mm/px · z∈[+1015,+1297]mm · 17 of 318 slices shown]
[im 18/318  lung]
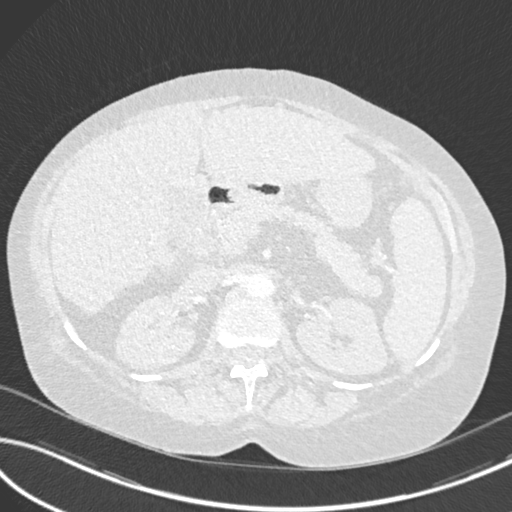
[im 36/318  mediastinal]
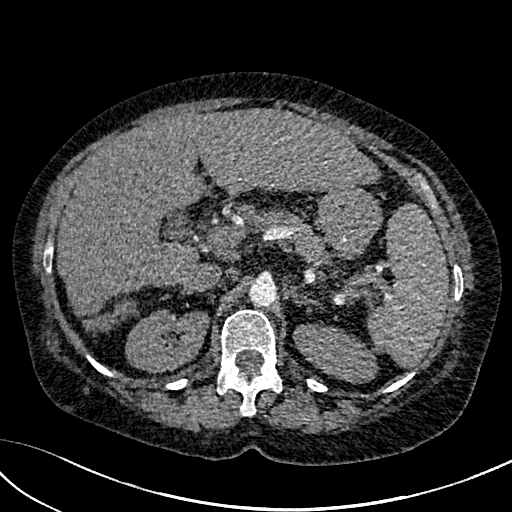
[im 53/318  lung]
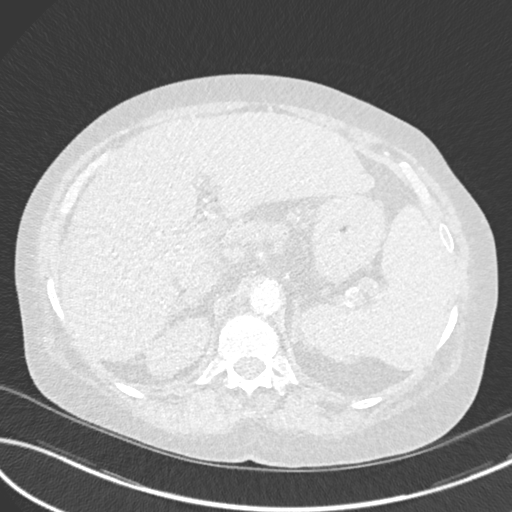
[im 71/318  mediastinal]
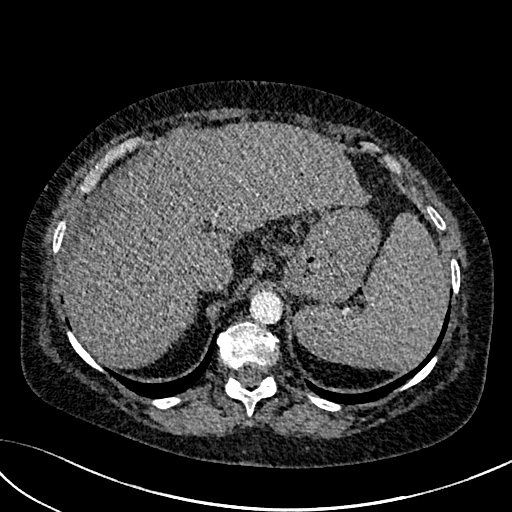
[im 89/318  lung]
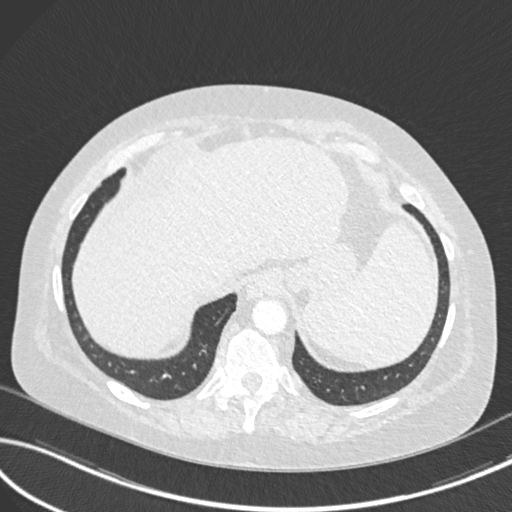
[im 106/318  mediastinal]
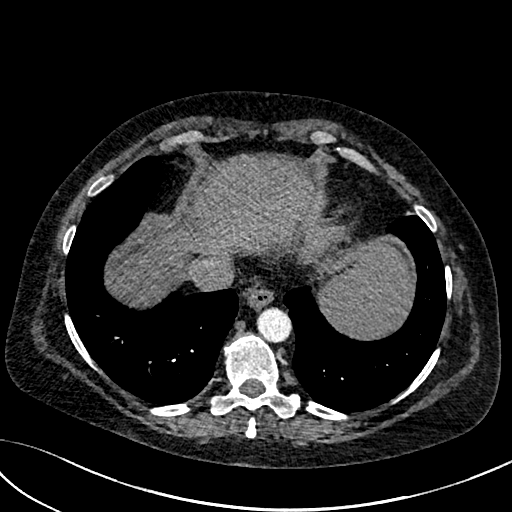
[im 124/318  lung]
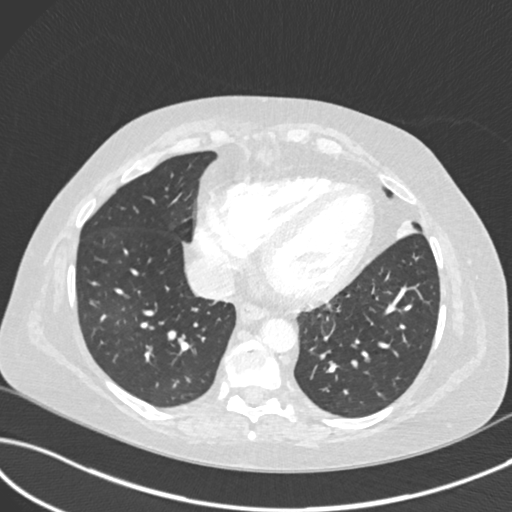
[im 141/318  mediastinal]
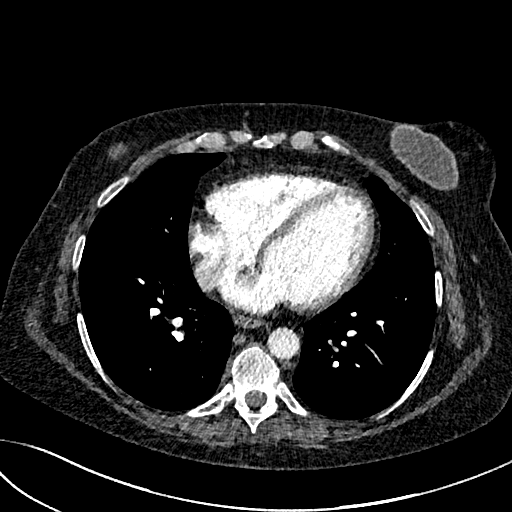
[im 159/318  lung]
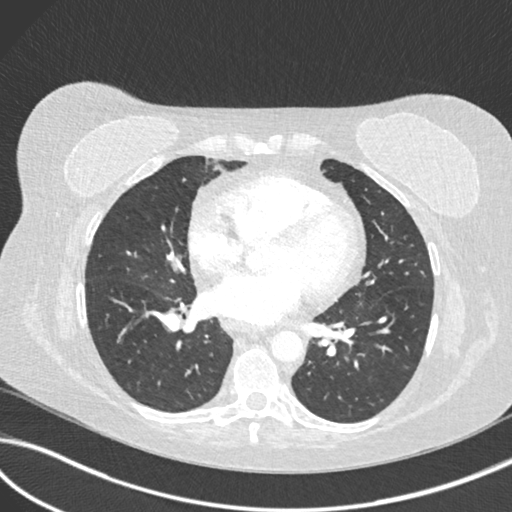
[im 177/318  mediastinal]
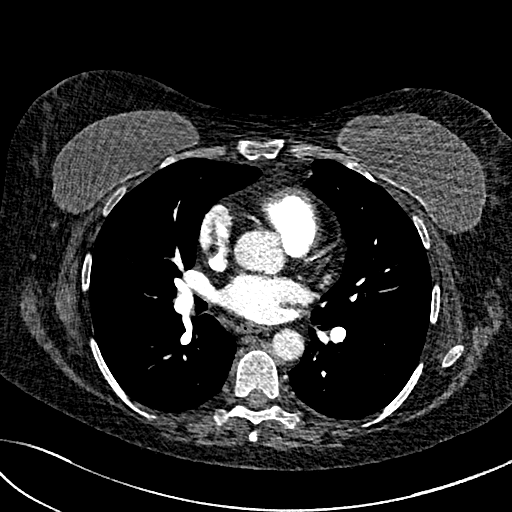
[im 194/318  lung]
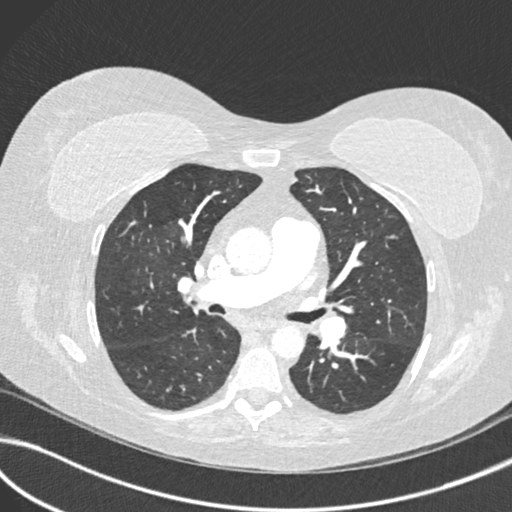
[im 212/318  mediastinal]
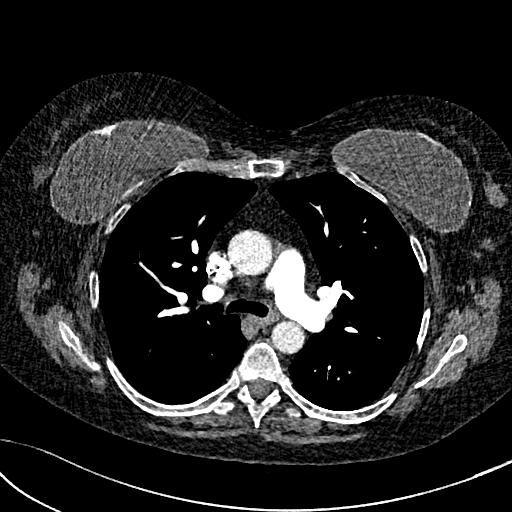
[im 229/318  lung]
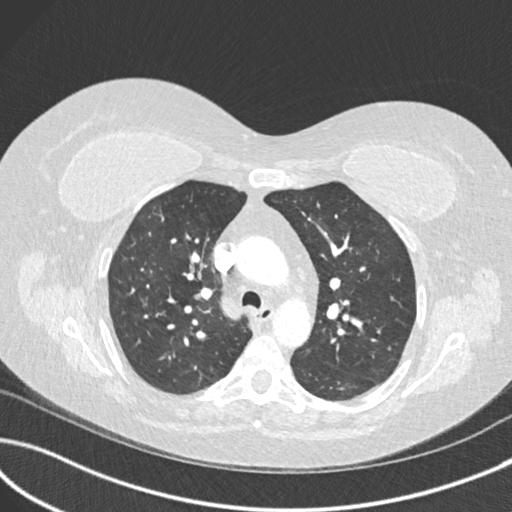
[im 247/318  mediastinal]
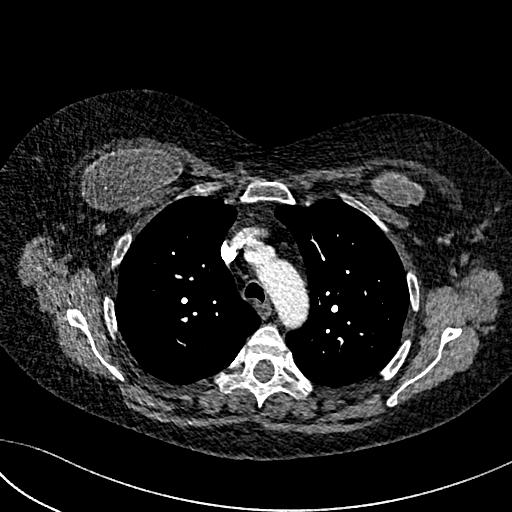
[im 265/318  lung]
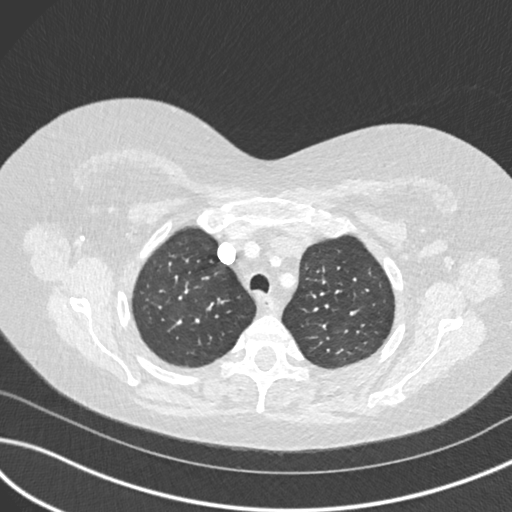
[im 282/318  mediastinal]
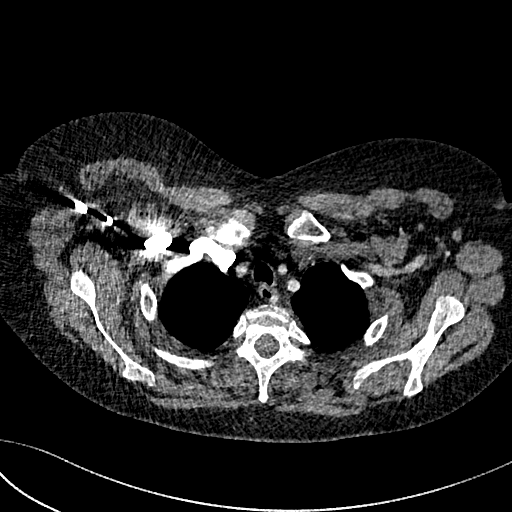
[im 300/318  lung]
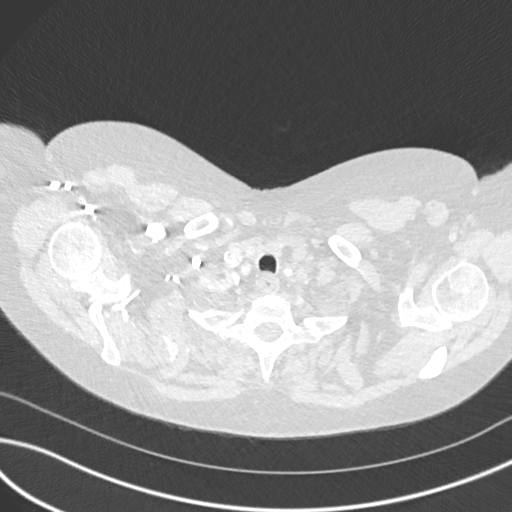

[Series 8: pe 2mm cor · coronal · 0.59mm/px · 1 of 141 slices shown]
[im 71/141  mediastinal]
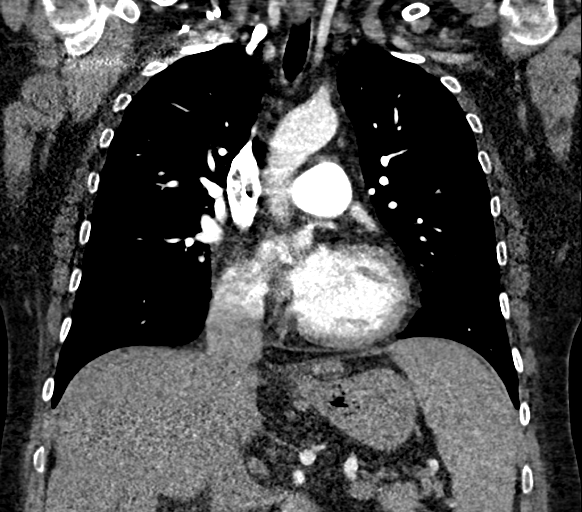

[18 of 36 positions shown; findings below may reference images not displayed]

FINDINGS: Cardiovascular: There is no demonstrable pulmonary embolus. There is
no thoracic aortic aneurysm or dissection. The visualized great
vessels appear normal. There is mild calcification in the thoracic
aorta. Pericardium is not appreciably thickened. There is no evident
pericardial effusion.

Mediastinum/Nodes: Visualized thyroid appears normal. There is no
appreciable thoracic adenopathy. No esophageal lesions are currently
evident.

Lungs/Pleura: There is stable scarring in the lingula. There is mild
upper lobe atelectatic change bilaterally. There is no edema or
consolidation. No pleural effusion or pleural thickening.

Upper Abdomen: There is a slight amount of ascites adjacent to the
liver. Visualized upper abdominal structures otherwise appear
unremarkable.

Musculoskeletal: There are breast implants bilaterally. There are no
evident blastic or lytic bone lesions.

Review of the MIP images confirms the above findings.
IMPRESSION: 1.  No demonstrable pulmonary embolus.

2. Mild scarring in the lingula. Mild atelectasis in the upper
lobes. No edema or consolidation.

3.  No thoracic adenopathy appreciable.

4. Small amount of upper abdominal ascites noted along the superior
rightward aspect of the liver.

5.  Mild aortic atherosclerotic calcification.

Aortic Atherosclerosis ([id number]-DZ3.3).

## 2020-01-15 IMAGING — CT CT HEAD W/O CM
3 series · 16 of 47 positions shown, 19 images · non-contrast
Comparison: 12/26/2015 head CT and 08/02/2015 MRI

CLINICAL DATA: Expressive aphasia. Altered mental status. History
of multiple sclerosis.

EXAM:
CT HEAD WITHOUT CONTRAST
TECHNIQUE: Contiguous axial images were obtained from the base of the skull
through the vertex without intravenous contrast.

[Series 3: head 5.0 h30s · axial · 0.41mm/px · z∈[-173,-28]mm · 10 of 35 slices shown, 13 images]
[im 3/35  brain]
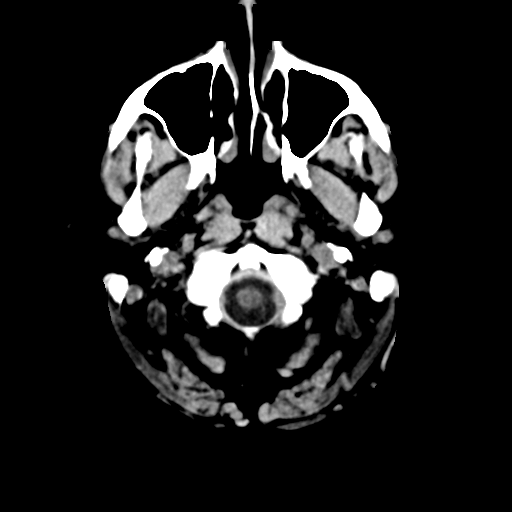
[im 3/35  bone]
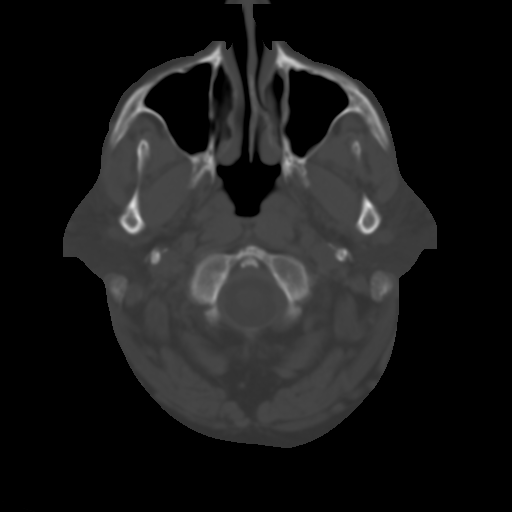
[im 6/35  brain]
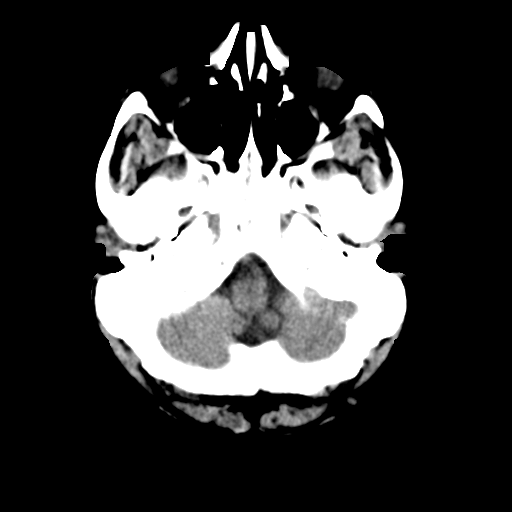
[im 10/35  brain]
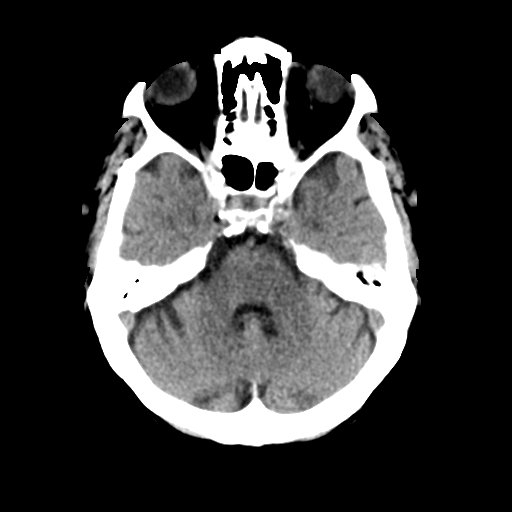
[im 12/35  brain]
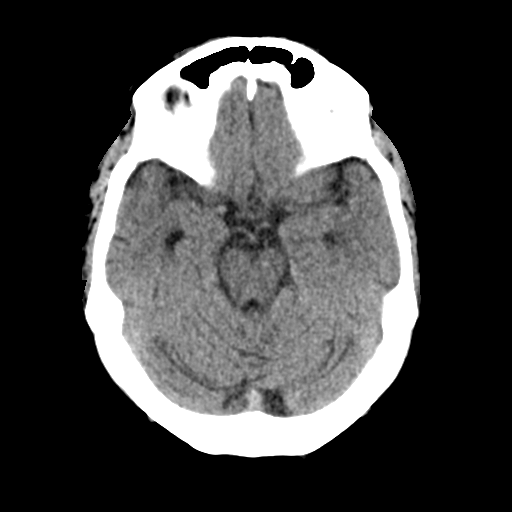
[im 16/35  brain]
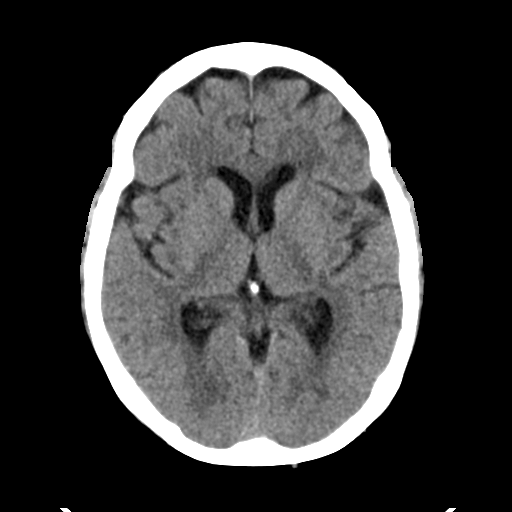
[im 16/35  bone]
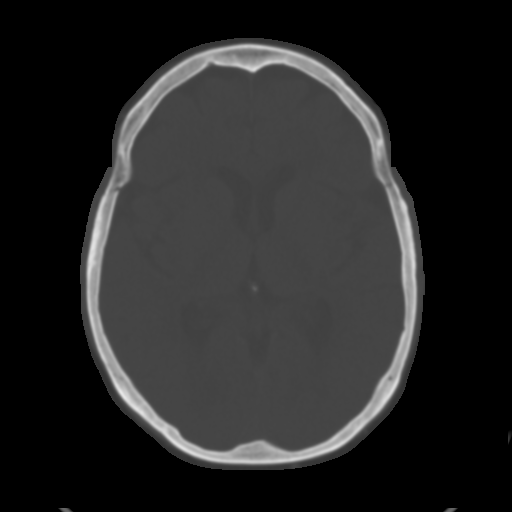
[im 19/35  brain]
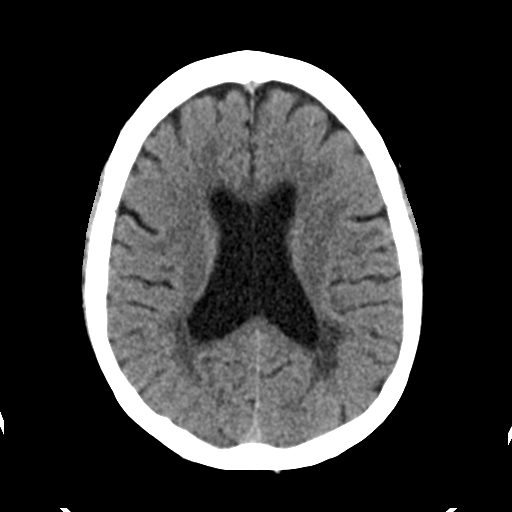
[im 23/35  brain]
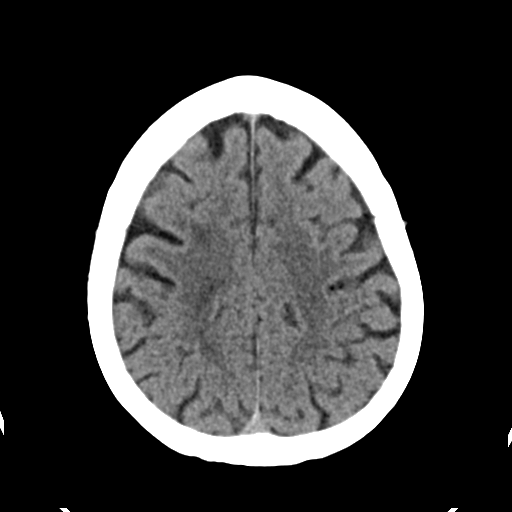
[im 26/35  brain]
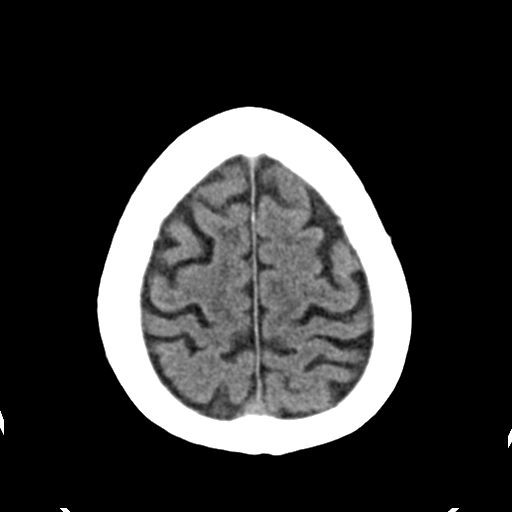
[im 29/35  brain]
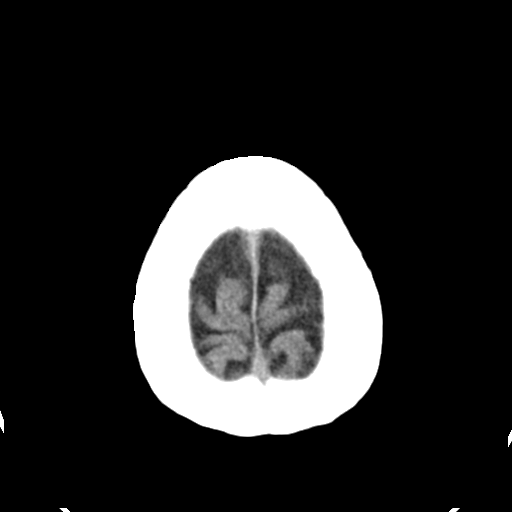
[im 29/35  bone]
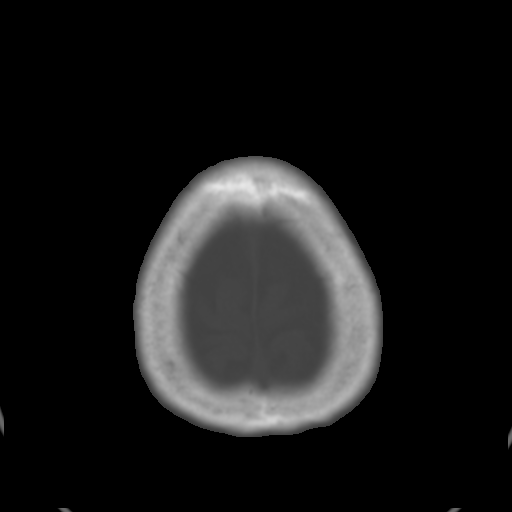
[im 32/35  brain]
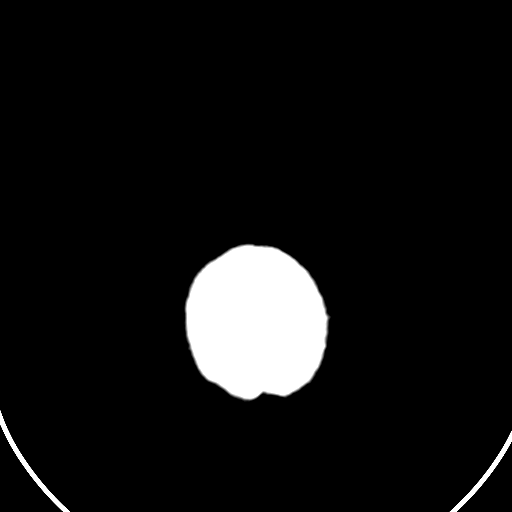

[Series 5: head 3.0 mpr cor · coronal · 0.33mm/px · 3 of 67 slices shown]
[im 23/67  brain]
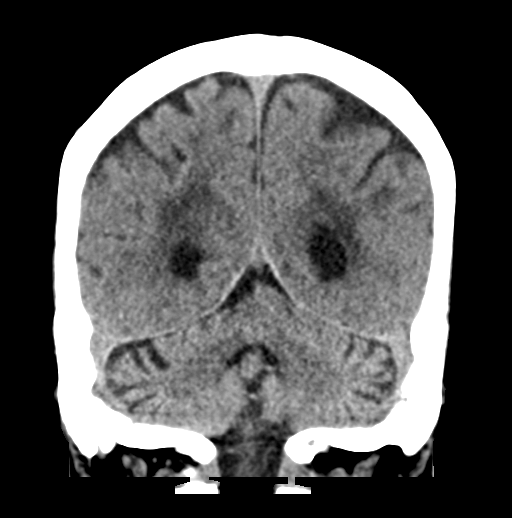
[im 30/67  brain]
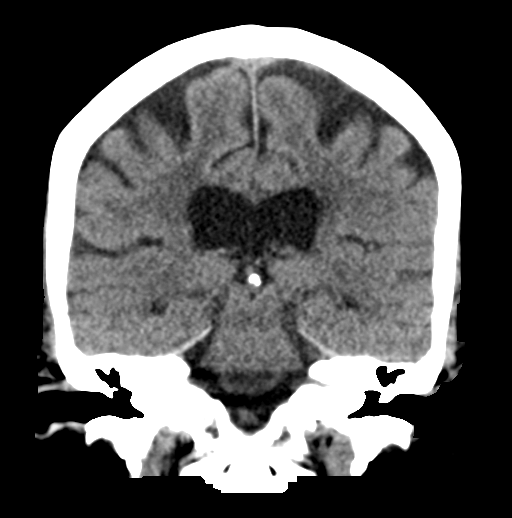
[im 37/67  brain]
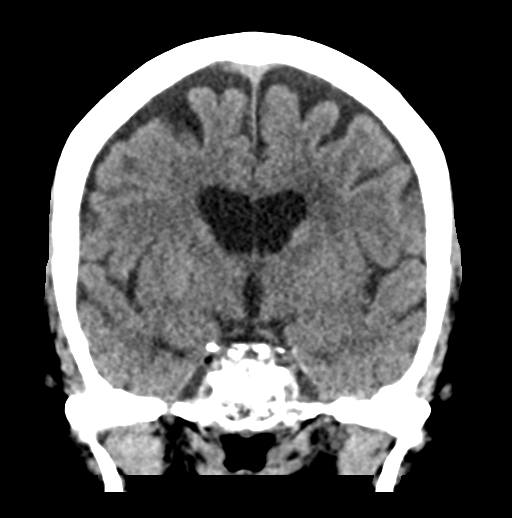

[Series 6: head 3.0 mpr sag · sagittal · 0.34mm/px · 3 of 57 slices shown]
[im 19/57  brain]
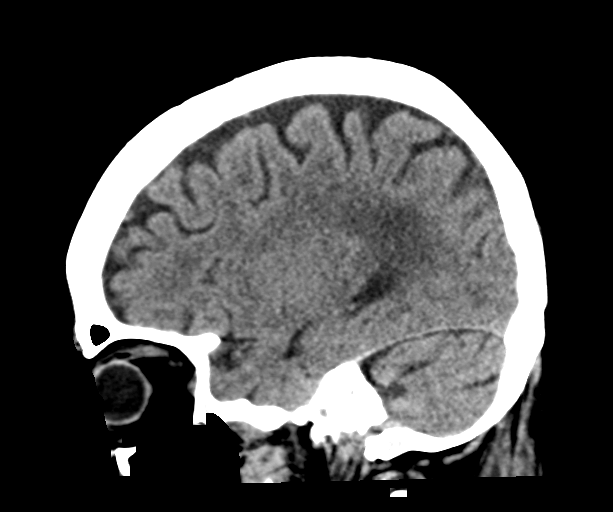
[im 29/57  brain]
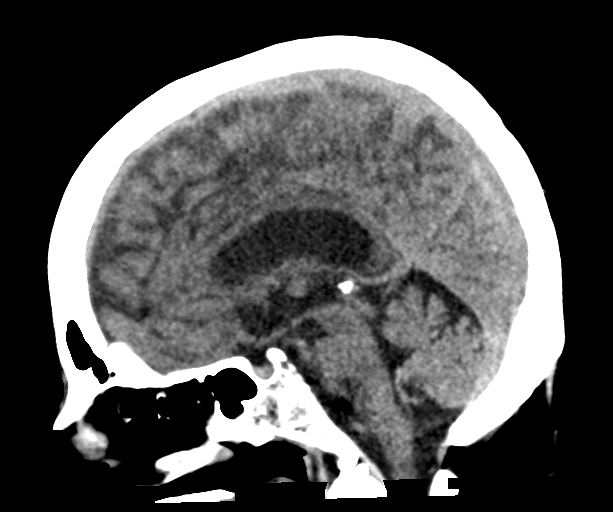
[im 38/57  brain]
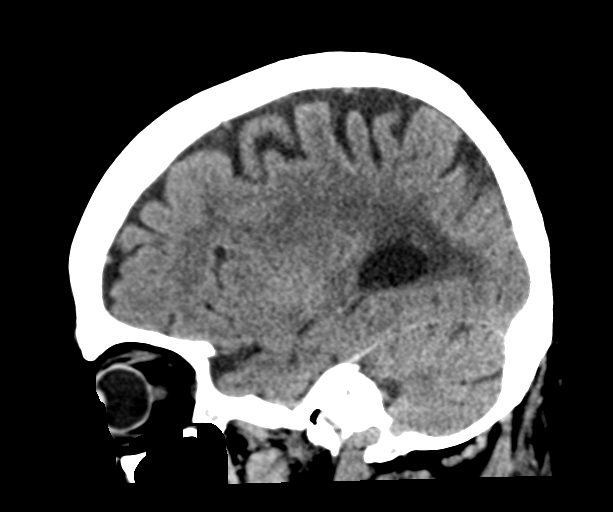

[16 of 47 positions shown; findings below may reference images not displayed]

FINDINGS: Brain: There is no evidence of acute infarct, intracranial
hemorrhage, mass, midline shift, or extra-axial fluid collection.
Generalized cerebral atrophy is unchanged and mildly advanced for
age. Patchy to confluent bilateral cerebral white matter
hypodensities are similar to the prior CT and moderate for age.

Vascular: Calcified atherosclerosis at the skull base. No hyperdense
vessel.

Skull: No fracture or focal osseous lesion.

Sinuses/Orbits: Minimal scattered paranasal sinus mucosal
thickening. Underdeveloped mastoid air cells bilaterally.
Unremarkable orbits.

Other: None.
IMPRESSION: 1. No evidence of acute intracranial abnormality.
2. Similar appearance of chronic white matter disease and cerebral
atrophy in this patient with a history of multiple sclerosis.

## 2020-01-22 IMAGING — CR DG CHEST 2V
2 series · 2 of 2 positions shown · non-contrast
Comparison: 03/07/2017, 01/02/2017

CLINICAL DATA: Cough

EXAM:
CHEST  2 VIEW

[w chest lat]
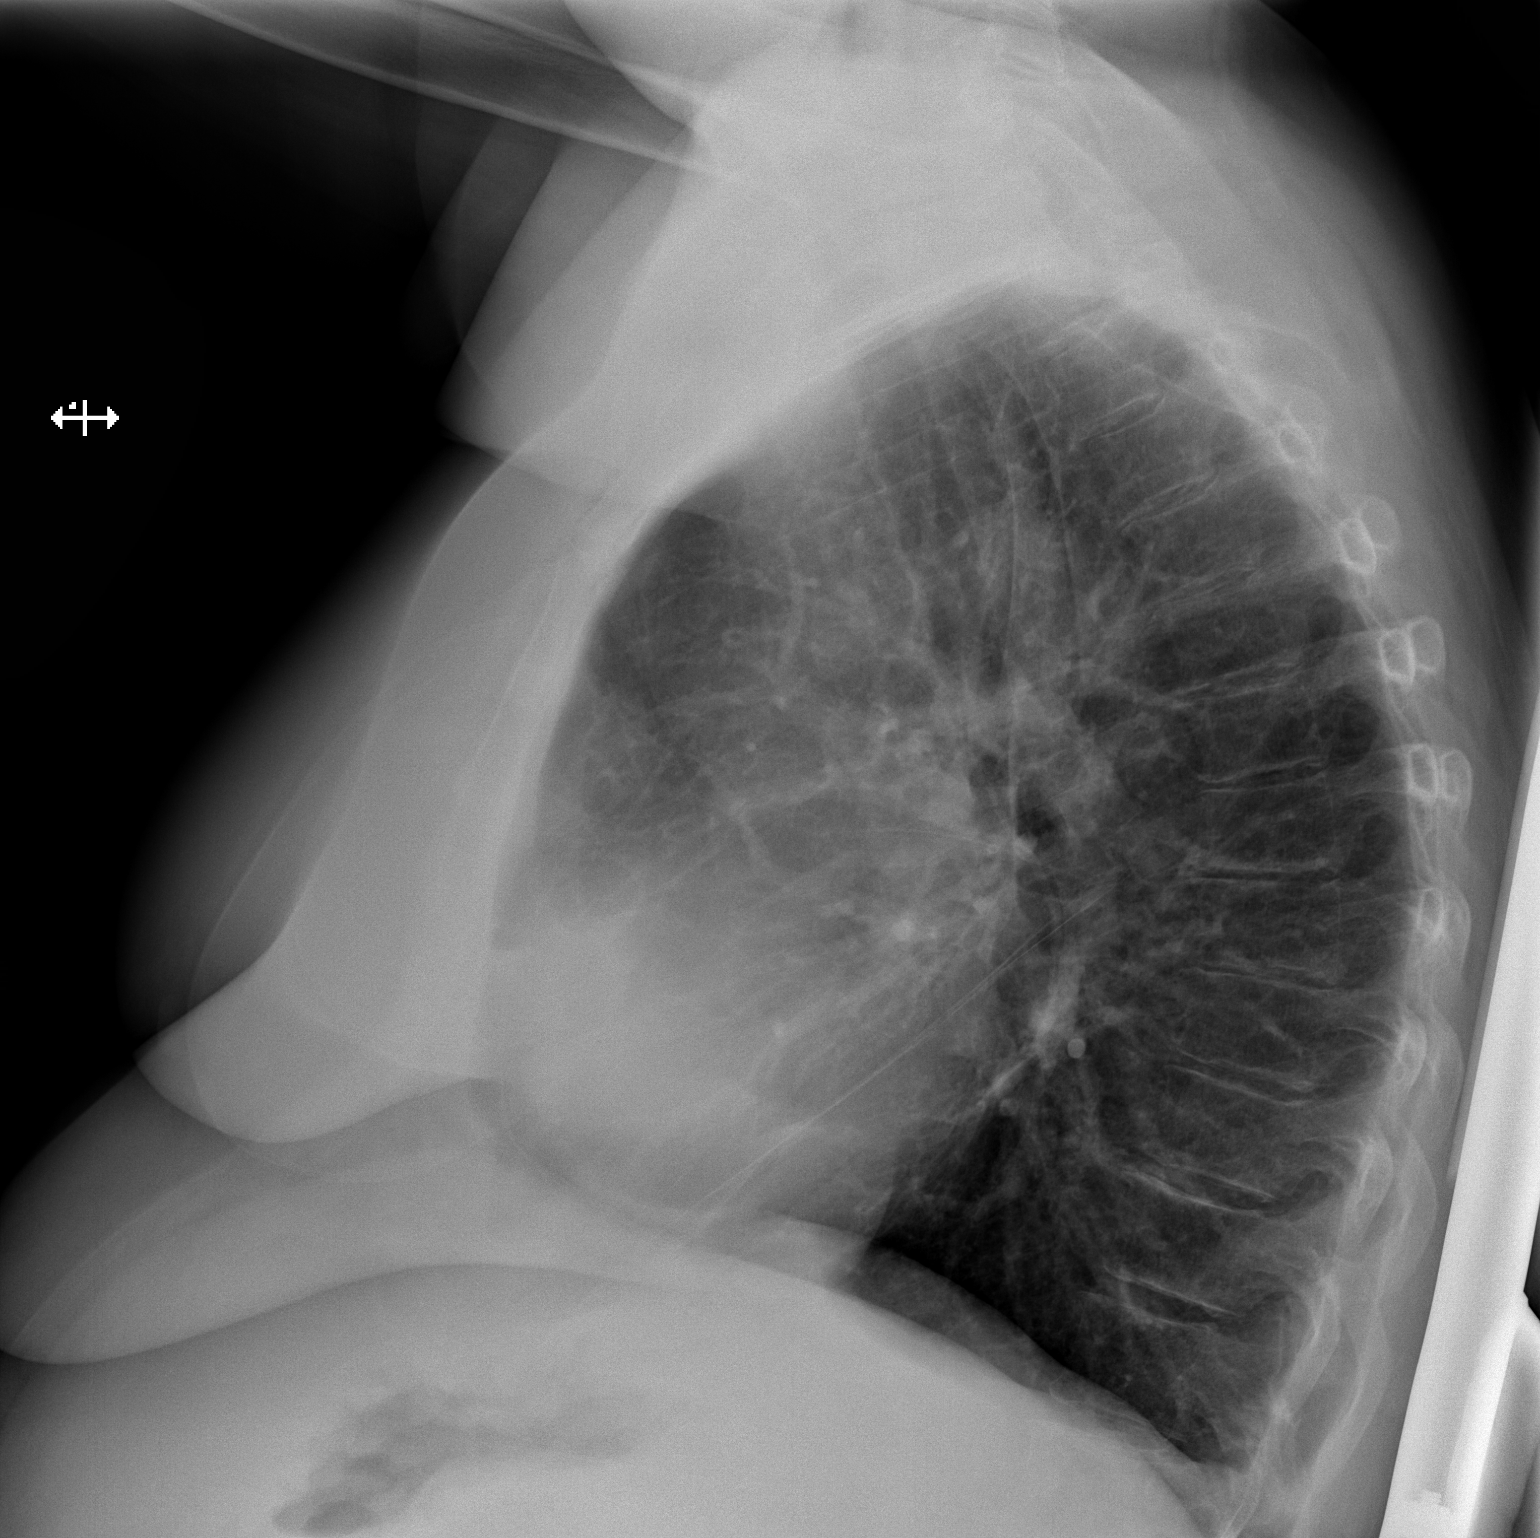

[x chest ap]
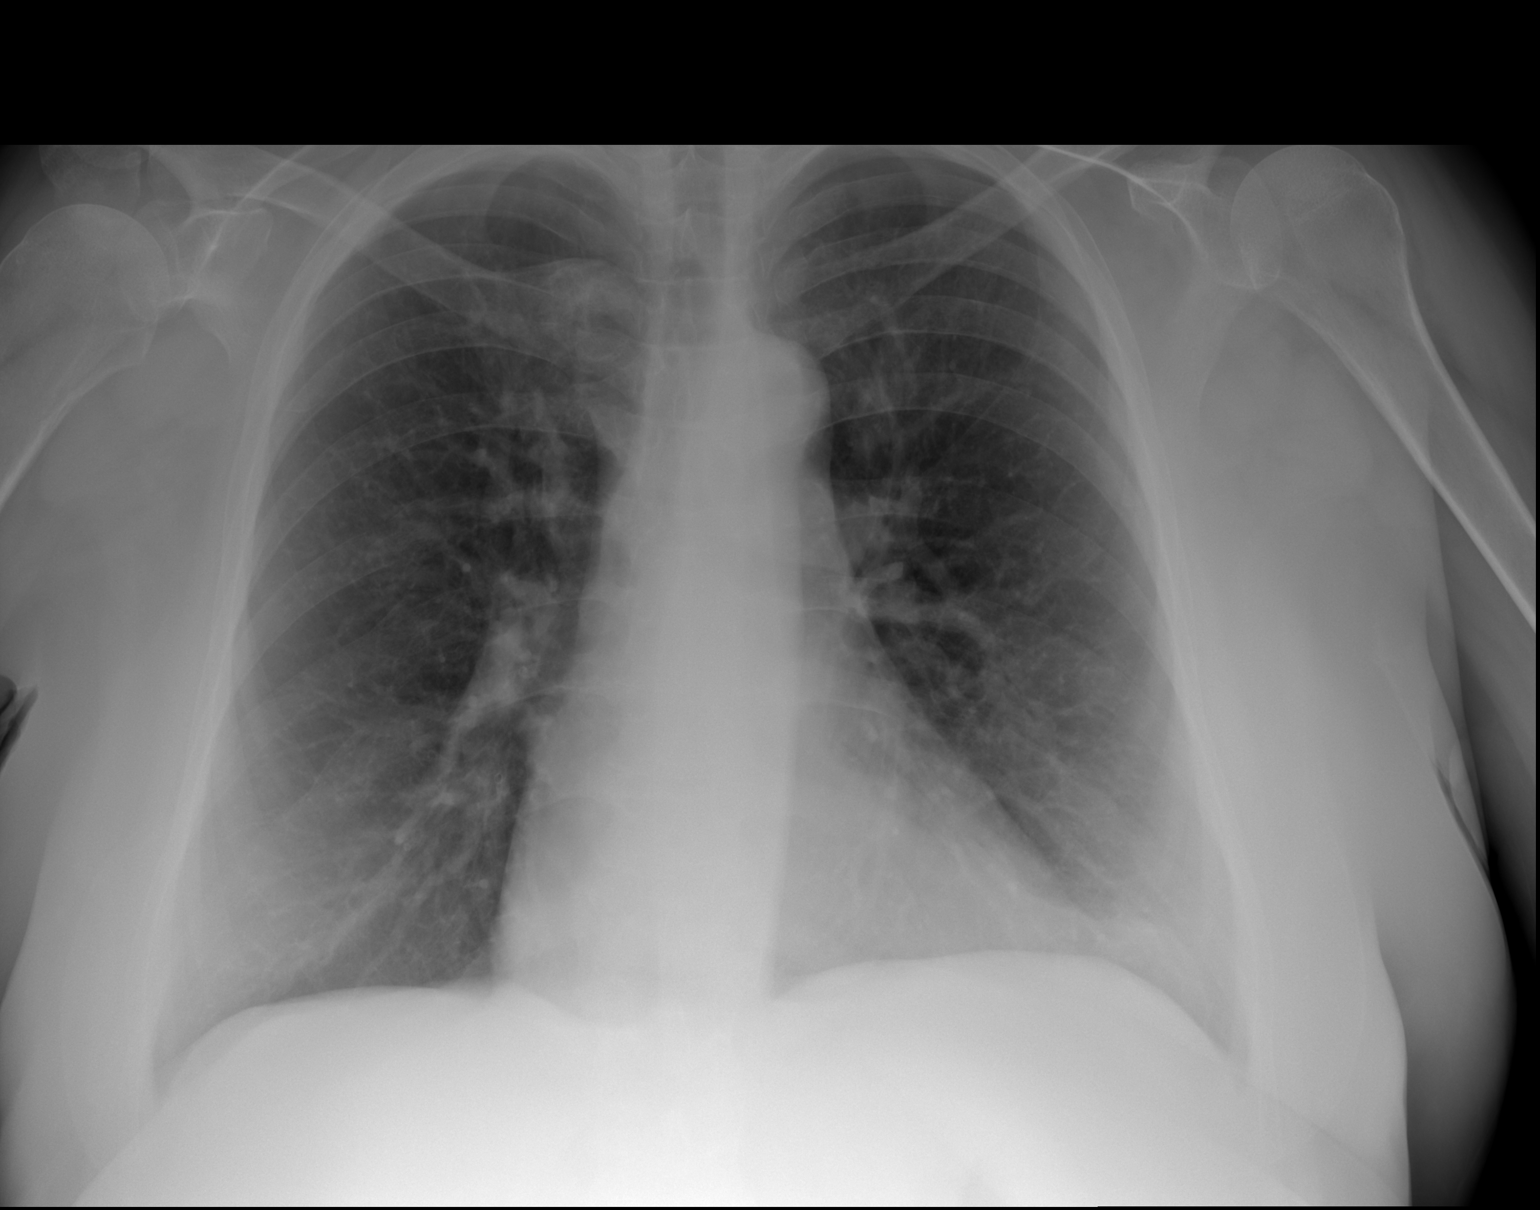

[2 of 2 positions shown; findings below may reference images not displayed]

FINDINGS: Hyperinflation with mild diffuse coarse interstitial opacity, likely
chronic. No focal pulmonary opacity, consolidation or pleural
effusion is evident. Stable cardiomediastinal silhouette. No
pneumothorax. No acute osseous abnormality.
IMPRESSION: No active cardiopulmonary disease.

## 2020-01-27 IMAGING — CT CT HEAD W/O CM
4 series · 15 of 47 positions shown, 17 images · non-contrast
Comparison: CT head dated March 07, 2017.

CLINICAL DATA: Headache and nausea.

EXAM:
CT HEAD WITHOUT CONTRAST
TECHNIQUE: Contiguous axial images were obtained from the base of the skull
through the vertex without intravenous contrast.

[Series 3: head wo · axial · 0.39mm/px · z∈[+1371,+1491]mm · 7 of 33 slices shown, 9 images]
[im 5/33  brain]
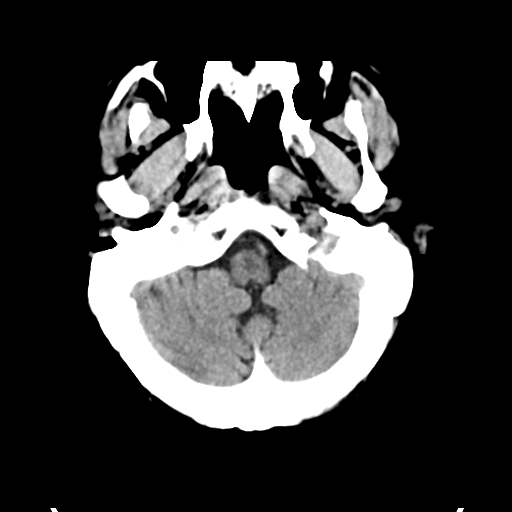
[im 5/33  bone]
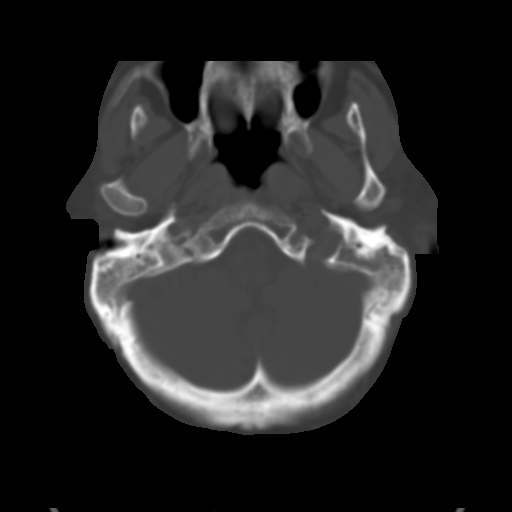
[im 9/33  brain]
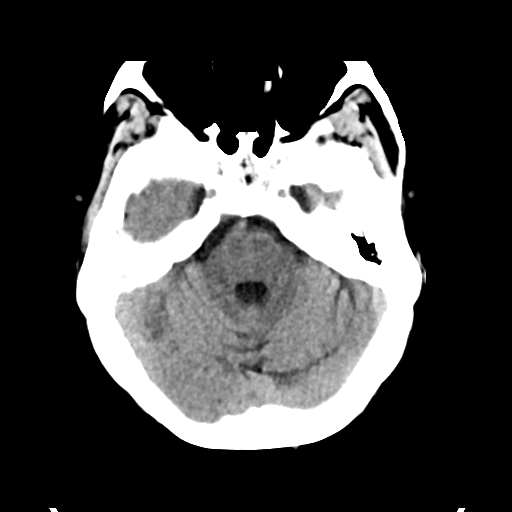
[im 13/33  brain]
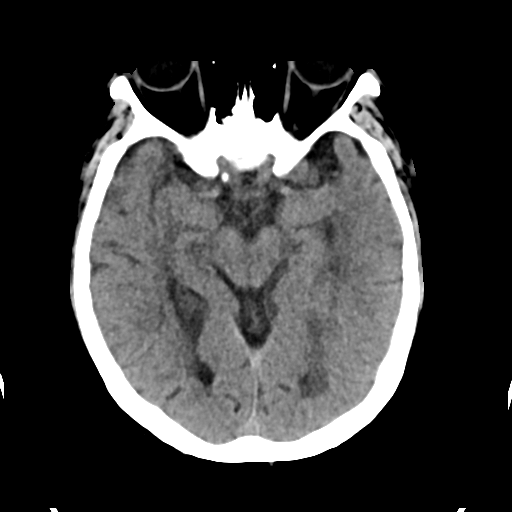
[im 17/33  brain]
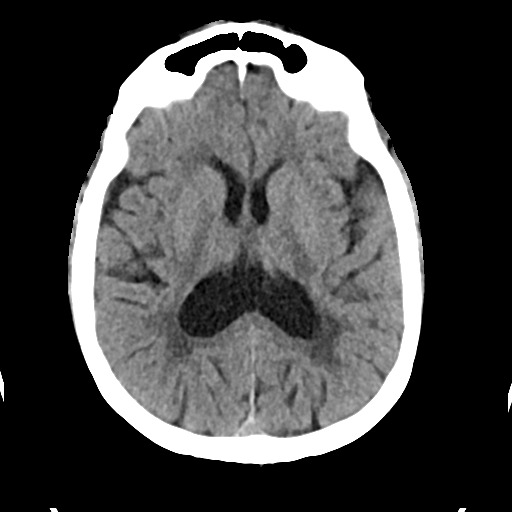
[im 21/33  brain]
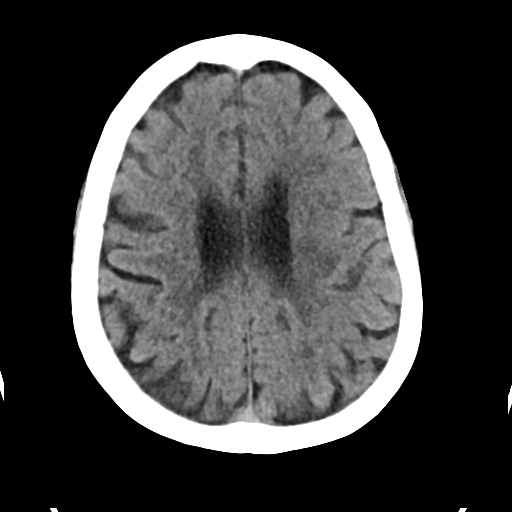
[im 21/33  bone]
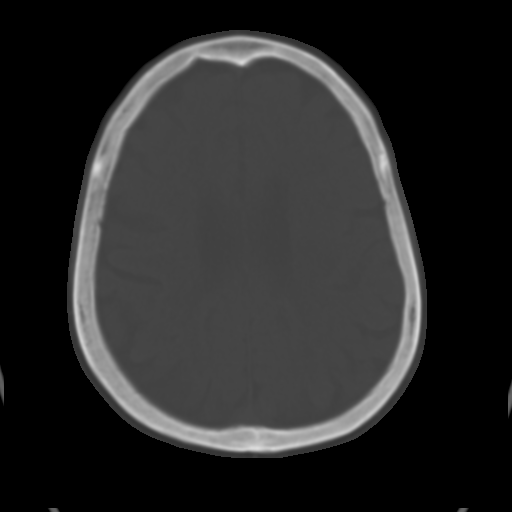
[im 25/33  brain]
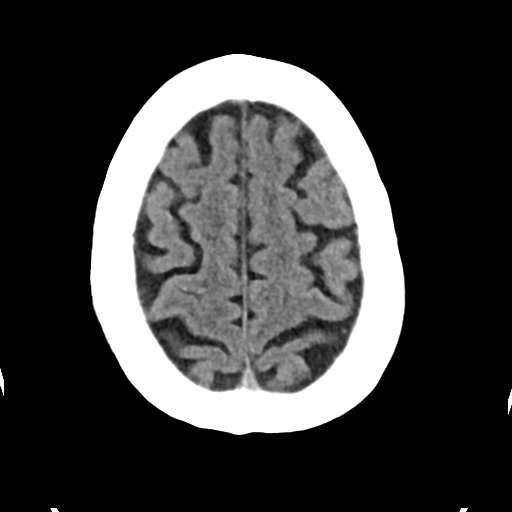
[im 29/33  brain]
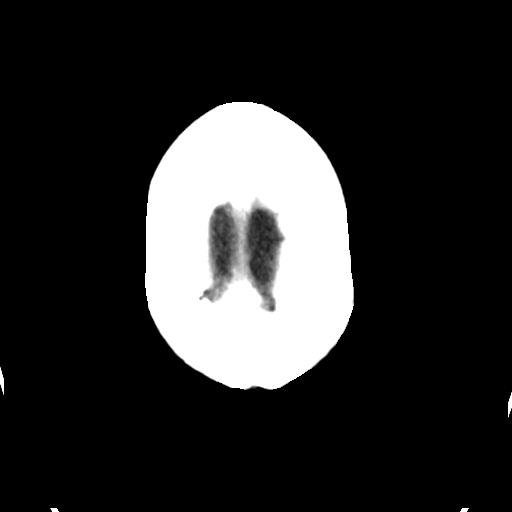

[Series 4: head bone · axial · 0.39mm/px · z∈[+1367,+1383]mm · 2 of 81 slices shown]
[im 9/81  bone]
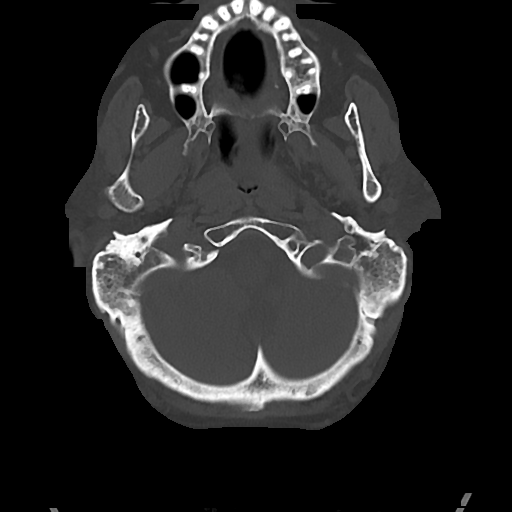
[im 17/81  bone]
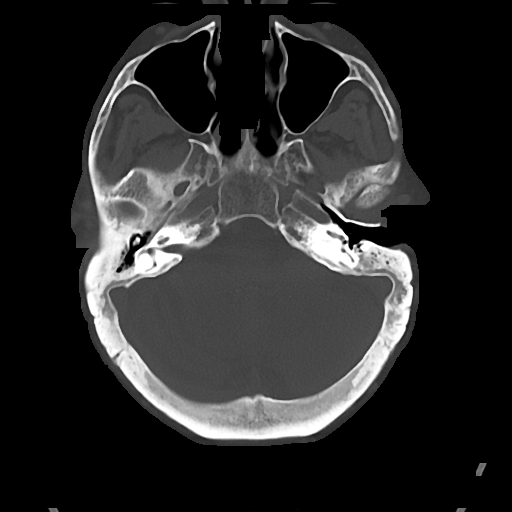

[Series 5: cor soft · coronal · 0.31mm/px · 3 of 67 slices shown]
[im 23/67  brain]
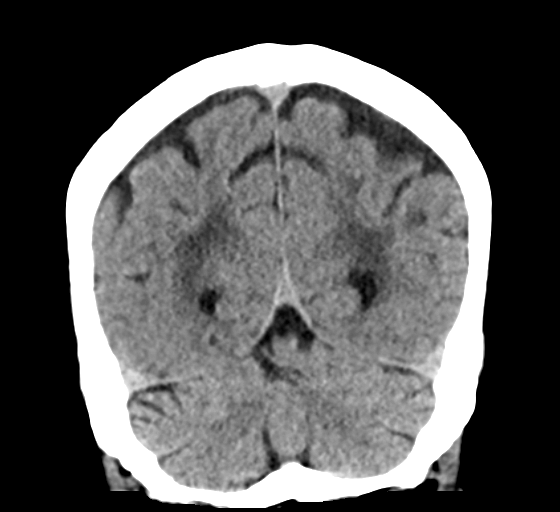
[im 30/67  brain]
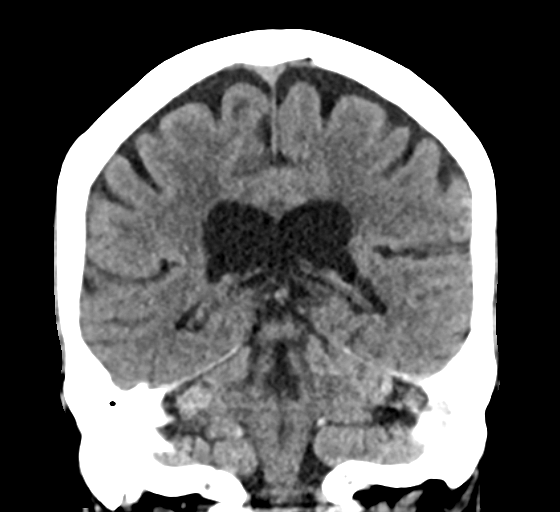
[im 37/67  brain]
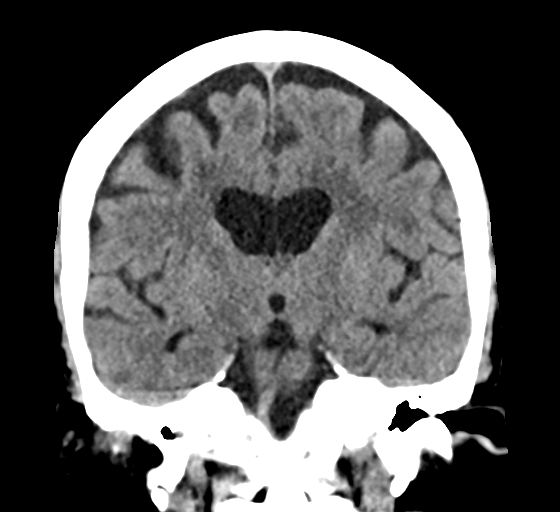

[Series 6: sag soft · sagittal · 0.31mm/px · 3 of 67 slices shown]
[im 23/67  brain]
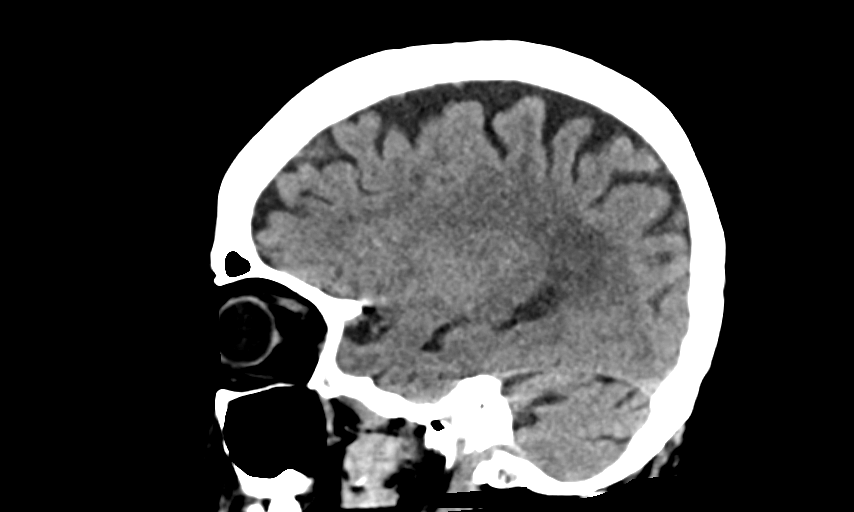
[im 34/67  brain]
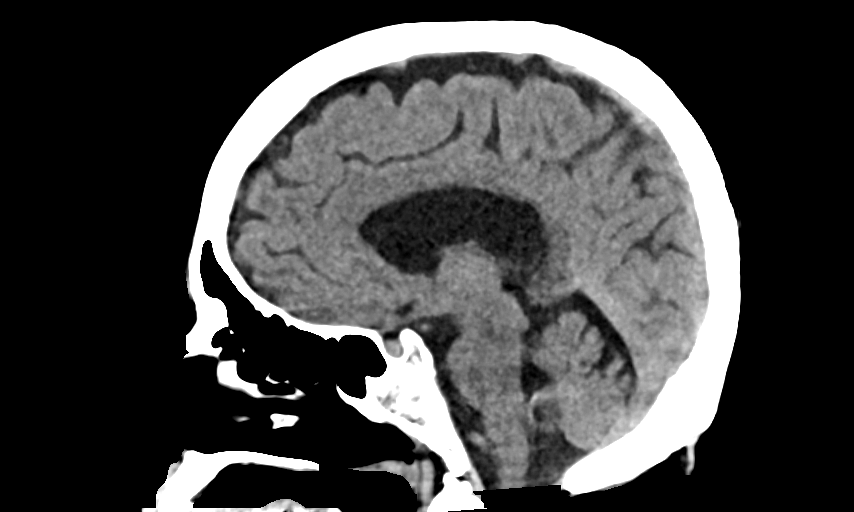
[im 45/67  brain]
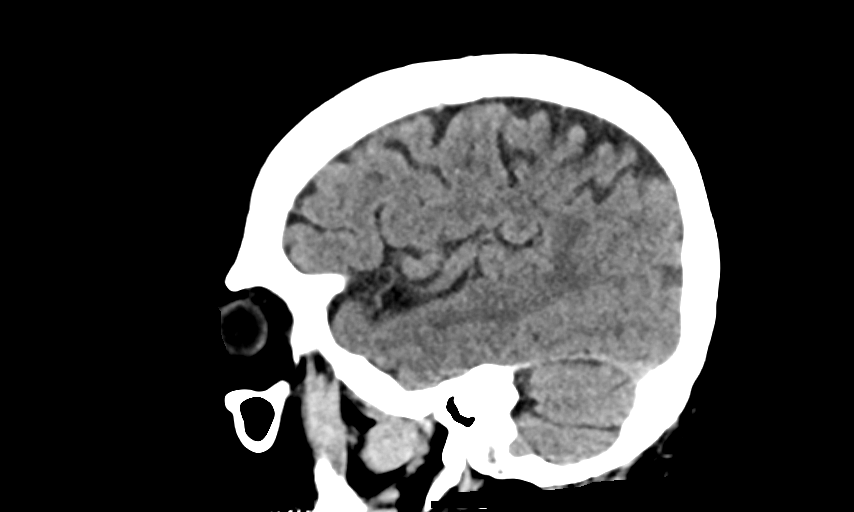

[15 of 47 positions shown; findings below may reference images not displayed]

FINDINGS: Brain: No evidence of acute infarction, hemorrhage, hydrocephalus,
extra-axial collection or mass lesion/mass effect. Stable atrophy
and moderate chronic microvascular ischemic changes.

Vascular: Atherosclerotic vascular calcification of the carotid
siphons. No hyperdense vessel.

Skull: Normal. Negative for fracture or focal lesion.

Sinuses/Orbits: No acute finding. Underdeveloped bilateral mastoid
air cells again noted.

Other: None.
IMPRESSION: 1. No acute intracranial abnormality. Stable atrophy and chronic
microvascular ischemic changes.

## 2020-02-23 IMAGING — CR DG RIBS W/ CHEST 3+V*R*
4 series · 4 of 4 positions shown · non-contrast
Comparison: 03/14/2017

CLINICAL DATA: Fall with right chest wall pain

EXAM:
RIGHT RIBS AND CHEST - 3+ VIEW

[x chest ap (1 of 4)]
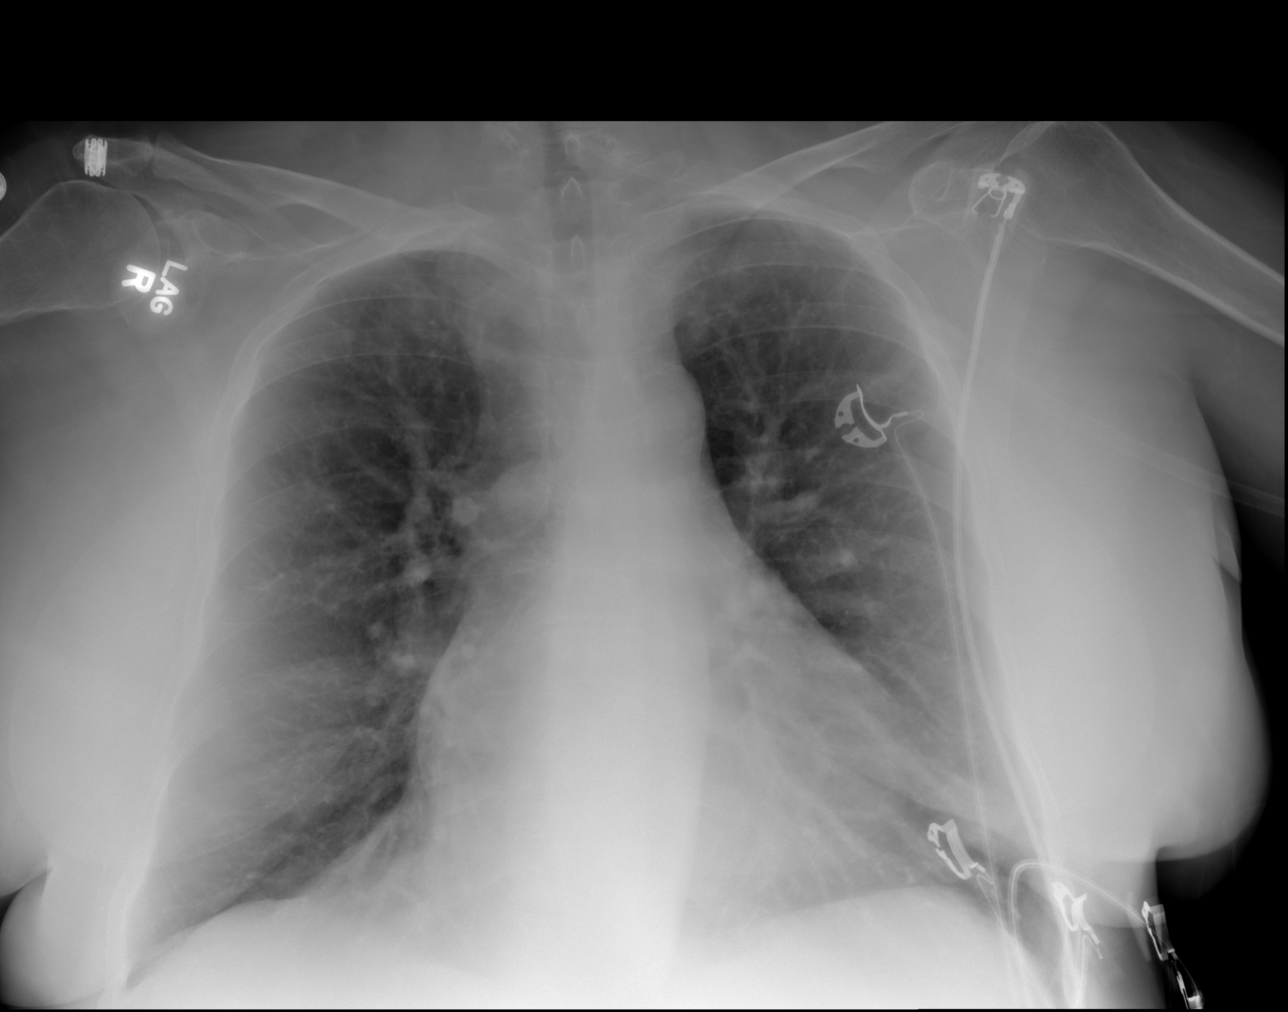

[x chest ap (2 of 4)]
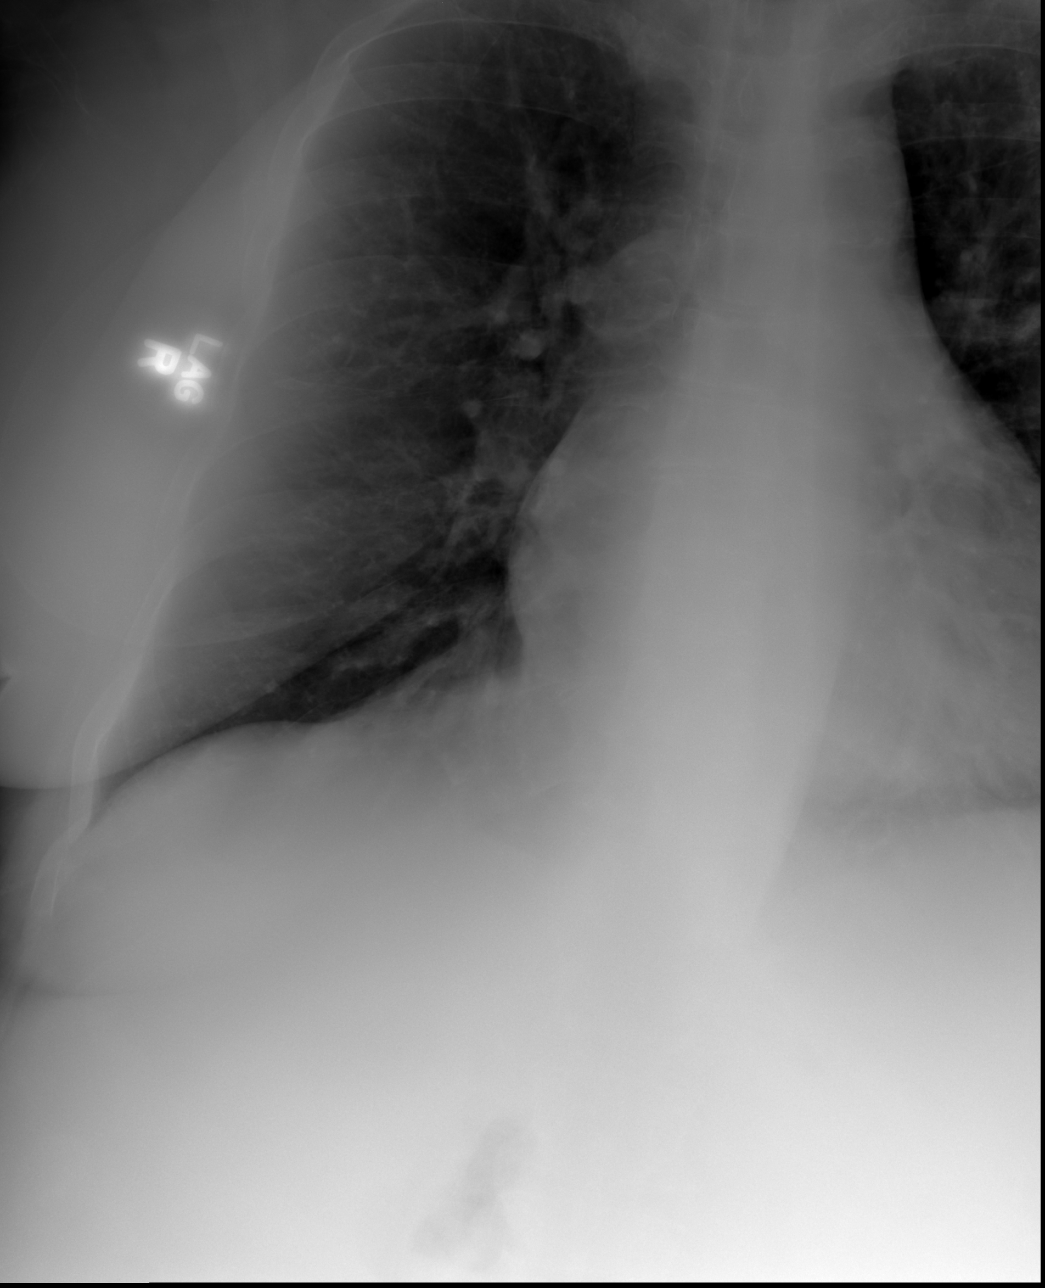

[x chest ap (3 of 4)]
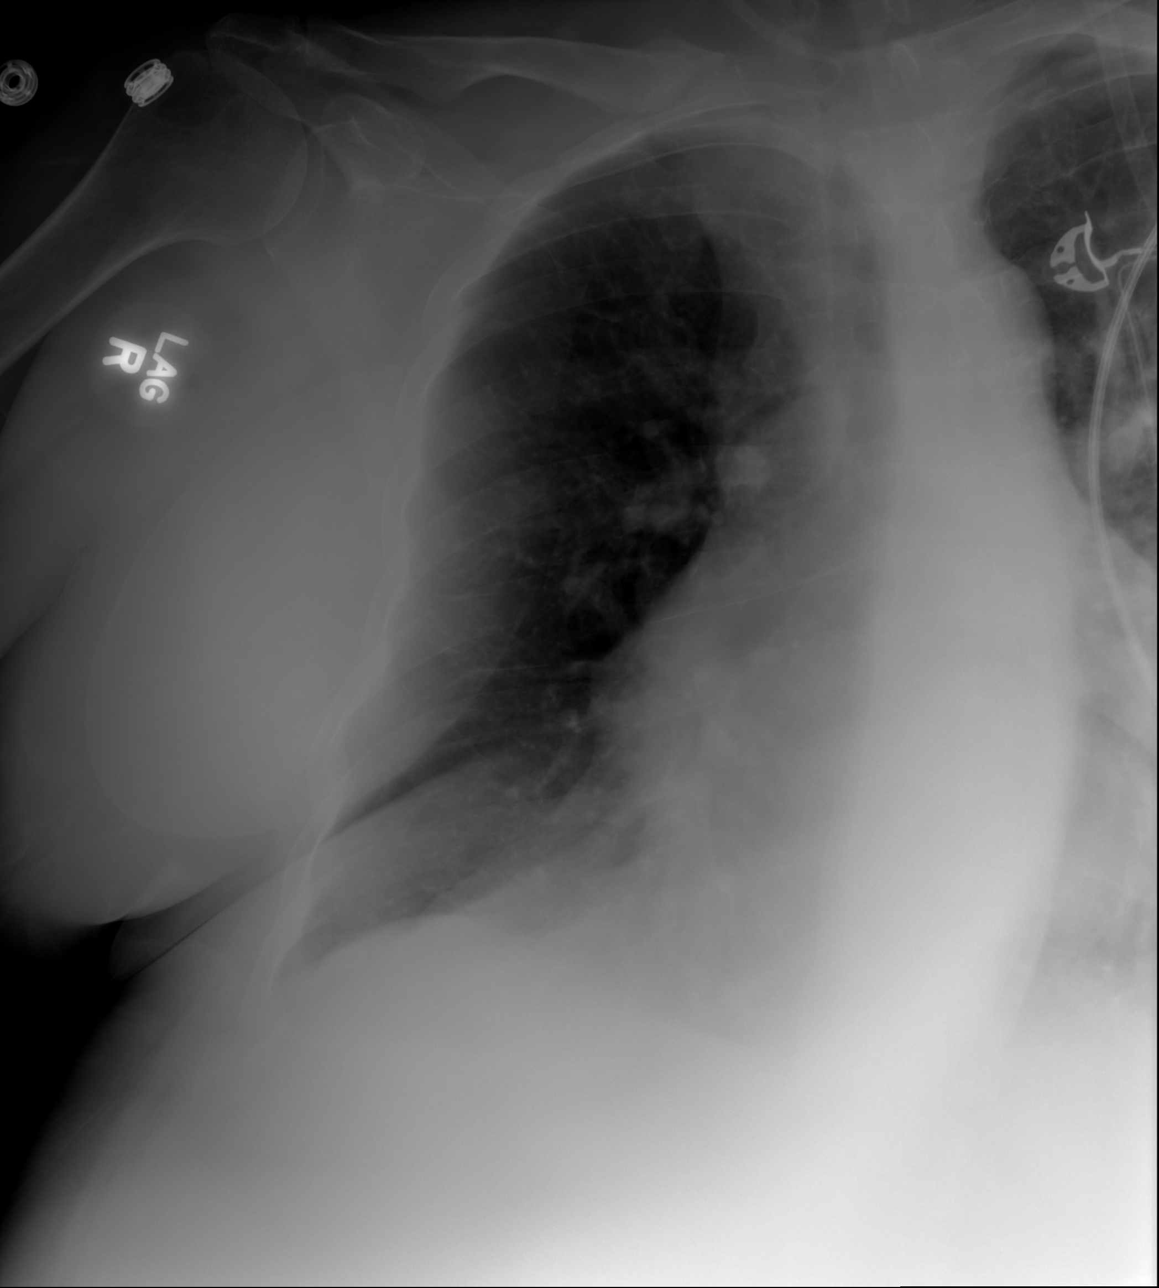

[x chest ap (4 of 4)]
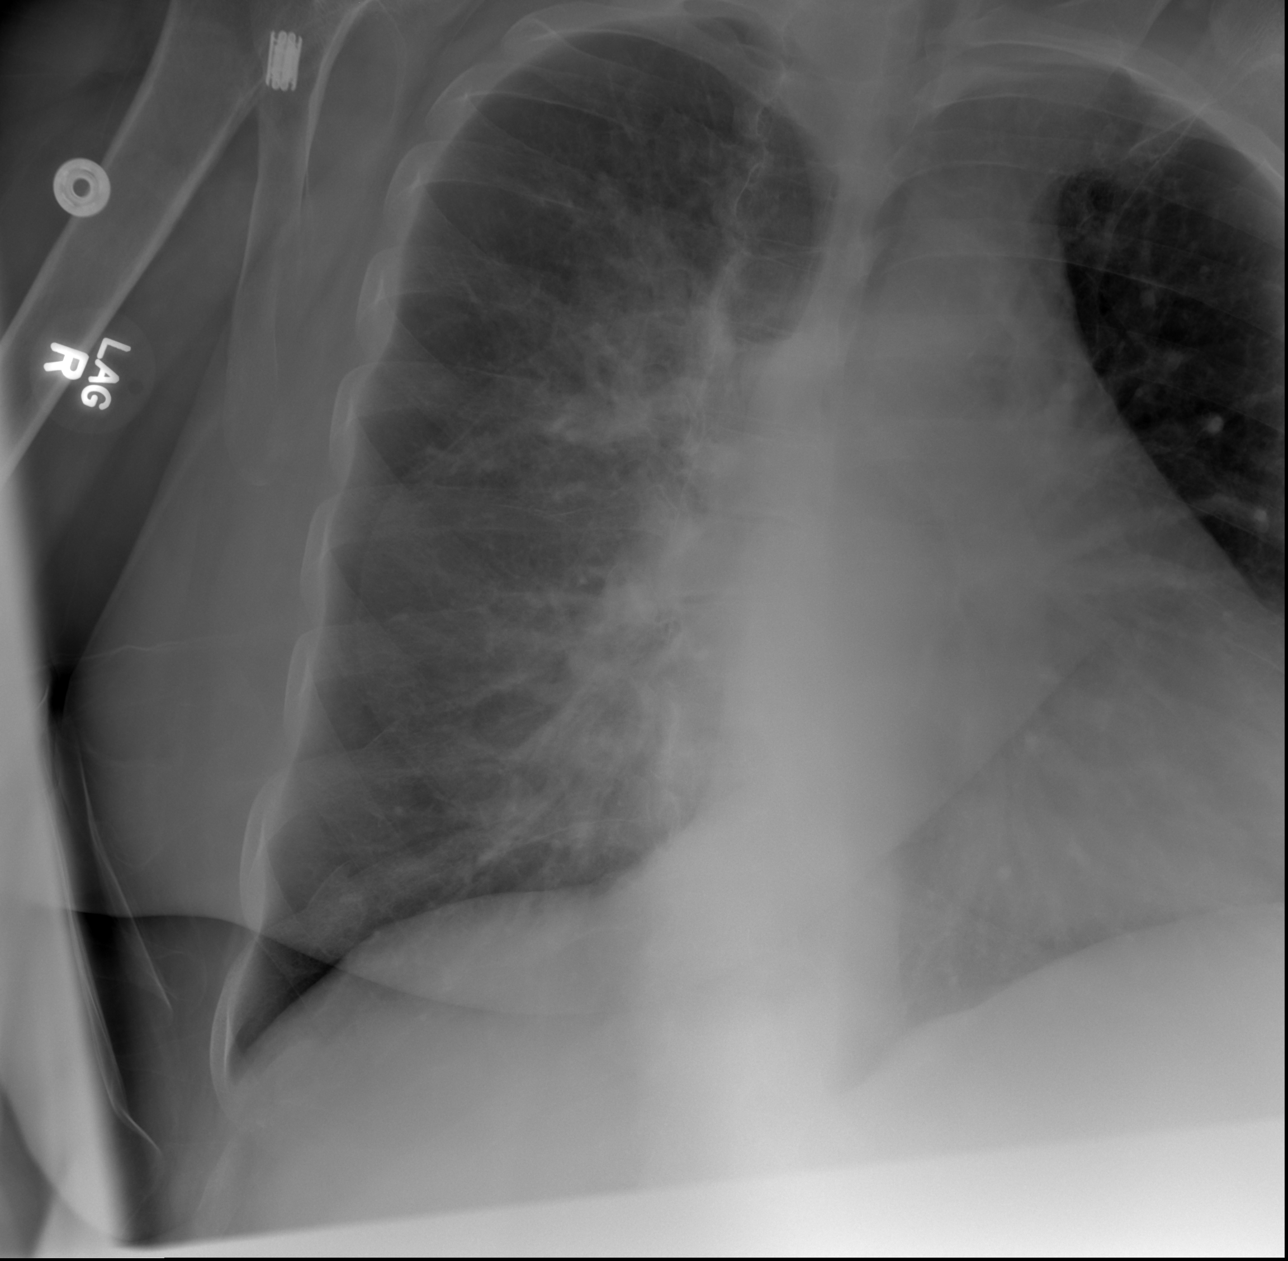

[4 of 4 positions shown; findings below may reference images not displayed]

FINDINGS: Single-view chest demonstrates cardiomegaly with vascular
congestion. No consolidation or pleural effusion. No pneumothorax.

Right rib series demonstrates probable acute right ninth and tenth
rib fractures.
IMPRESSION: 1. Negative for pneumothorax or pleural effusion
2. Probable acute right ninth and tenth rib fractures
3. Cardiomegaly with vascular congestion.

## 2020-02-26 IMAGING — DX DG CHEST 1V
1 series · 1 of 1 positions shown · non-contrast
Comparison: Plain film of the chest and right ribs dated
04/15/2017.

CLINICAL DATA: Follow-up rib fractures. History of chronic alcohol
abuse, alcoholic liver disease, possible alcohol withdrawal.

EXAM:
CHEST 1 VIEW

[chest ap]
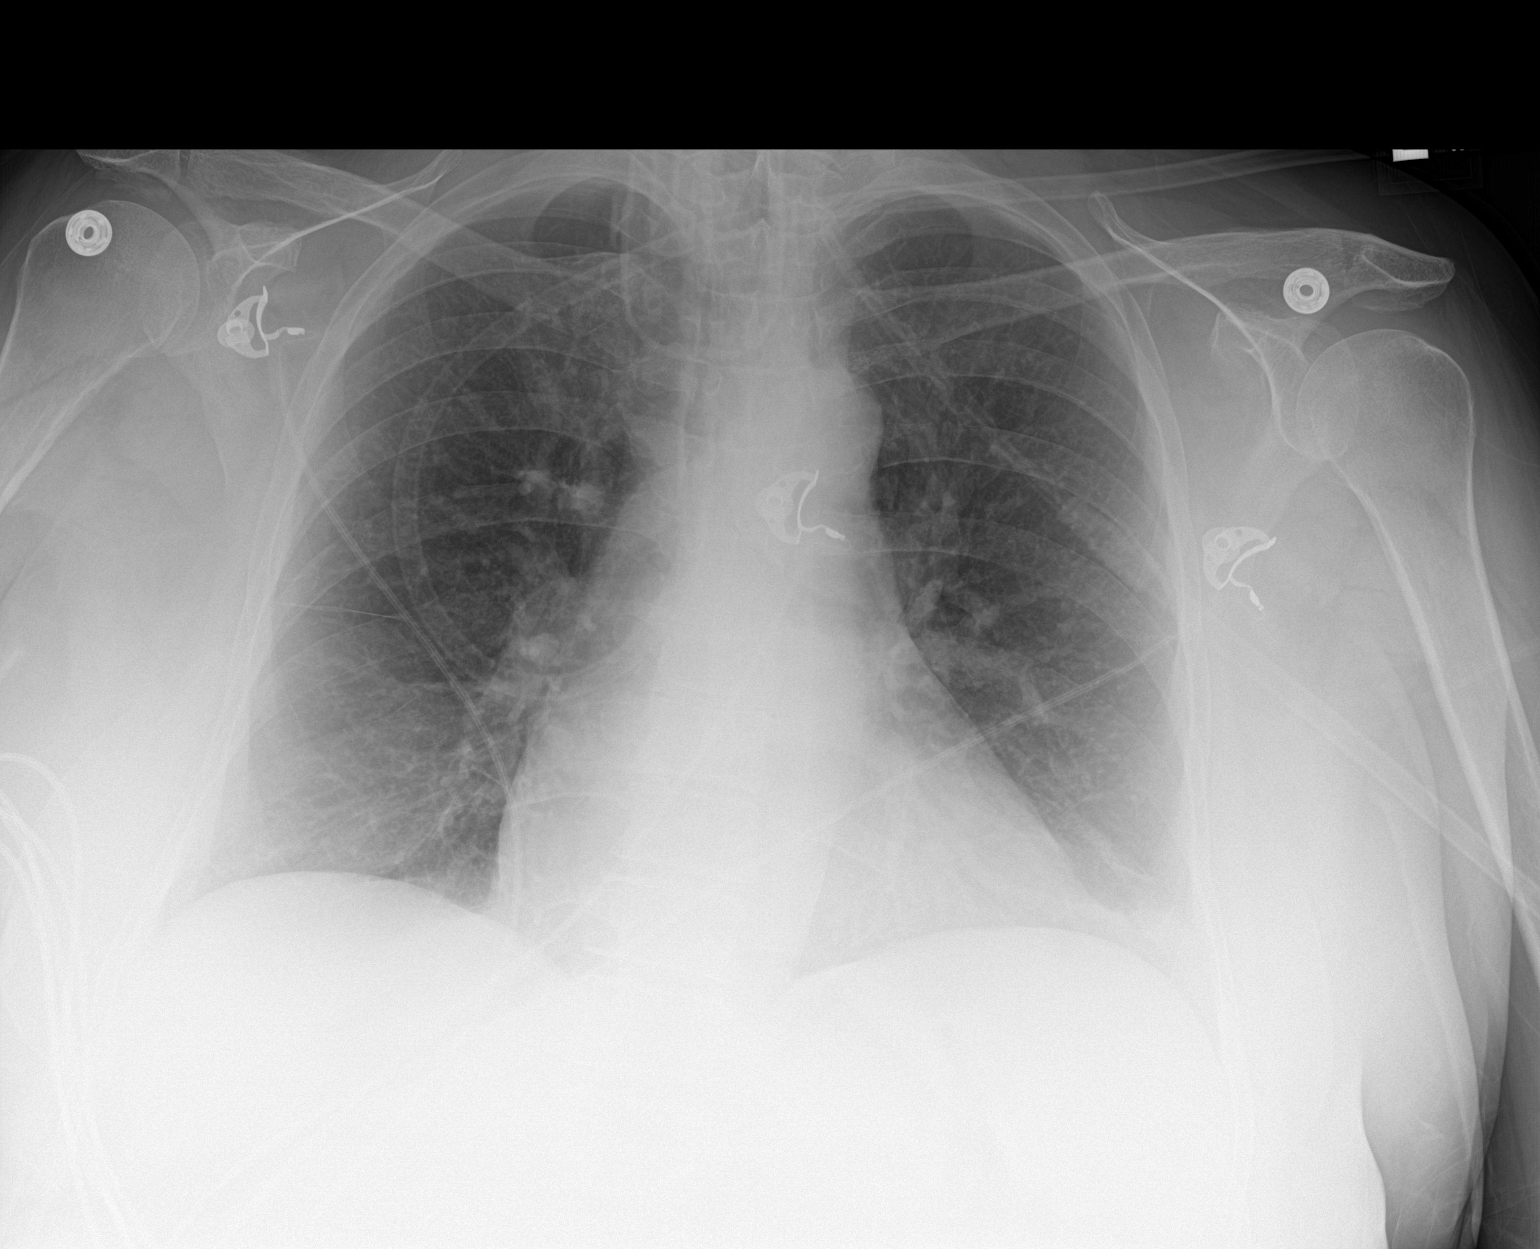

[1 of 1 positions shown; findings below may reference images not displayed]

FINDINGS: Mild cardiomegaly is stable. Overall cardiomediastinal silhouette is
stable. Lungs are clear. The displaced fractures of the posterior
right eighth and ninth ribs appear grossly stable, better seen on
earlier rib films. No new osseous abnormality.
IMPRESSION: 1. Lungs are clear. No pneumonia, pleural effusion or pneumothorax
seen.
2. Displaced fractures of the posterior right ribs appear grossly
stable, better seen on earlier rib films.

## 2020-03-15 IMAGING — CT CT ABD-PELV W/ CM
2 of 5 series · 16 of 46 positions shown, 18 images · IV contrast (ISOVUE)
Comparison: CT abdomen and pelvis 12/21/2016.

CLINICAL DATA: Vomiting and abdominal pain for 2 days. Recent rib
fractures. Cirrhosis.

EXAM:
CT ABDOMEN AND PELVIS WITH CONTRAST
TECHNIQUE: Multidetector CT imaging of the abdomen and pelvis was performed
using the standard protocol following bolus administration of
intravenous contrast.
CONTRAST:  100mL ZNHQB0-TFF IOPAMIDOL (ZNHQB0-TFF) INJECTION 61%

[Series 2: axial st · axial · 0.85mm/px · z∈[+1141,+1476]mm · 13 of 79 slices shown, 15 images]
[im 6/79  soft-tissue]
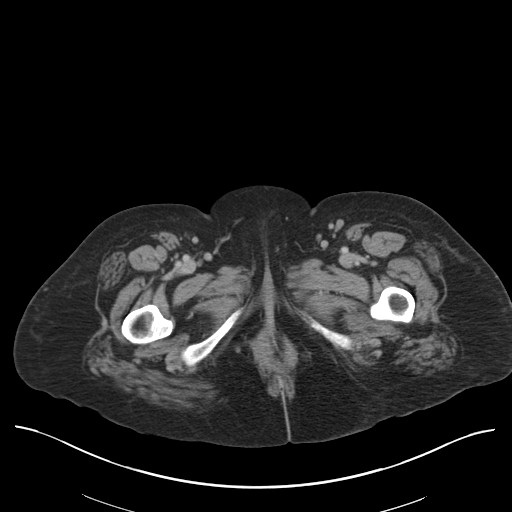
[im 6/79  bone]
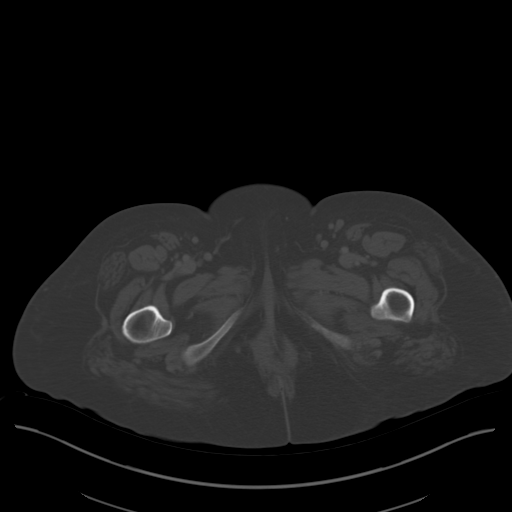
[im 12/79  soft-tissue]
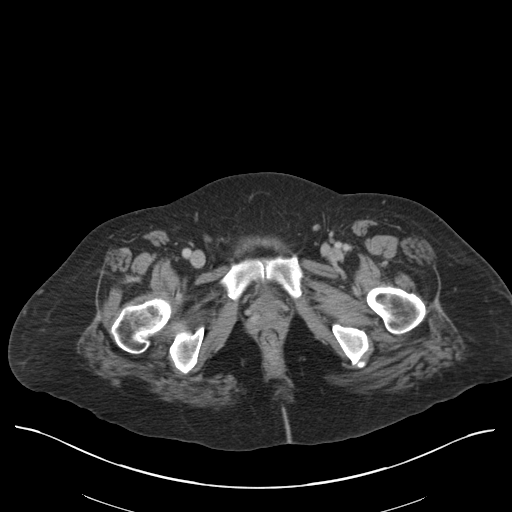
[im 17/79  soft-tissue]
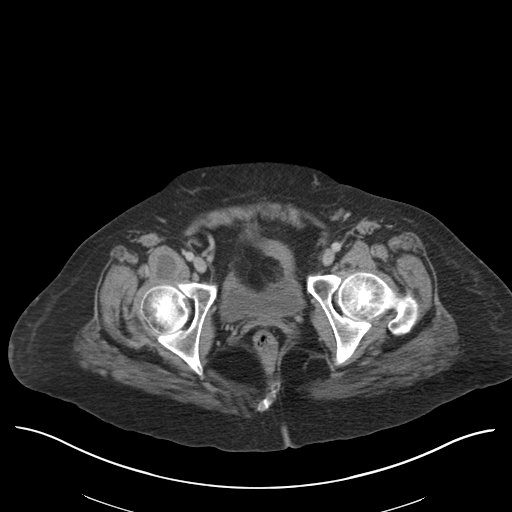
[im 23/79  soft-tissue]
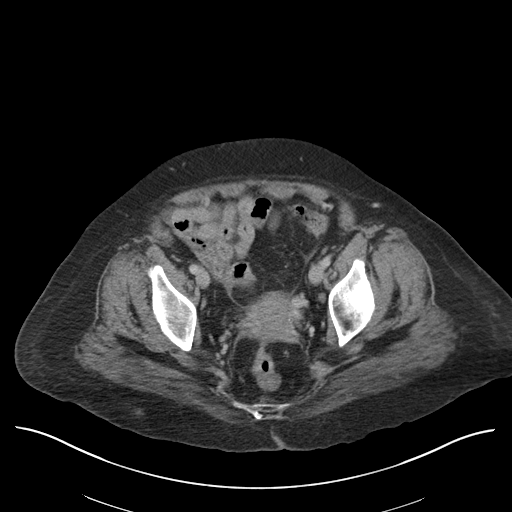
[im 28/79  soft-tissue]
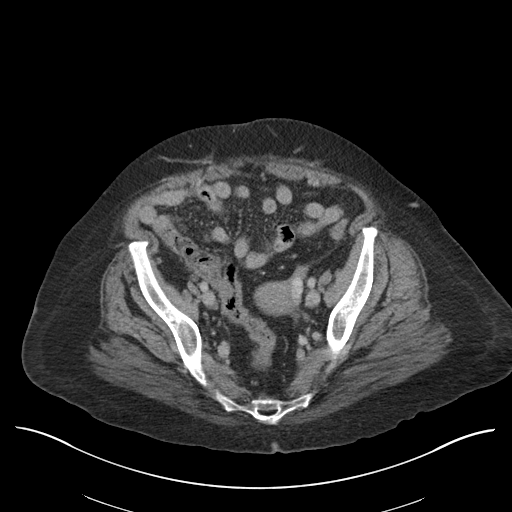
[im 34/79  soft-tissue]
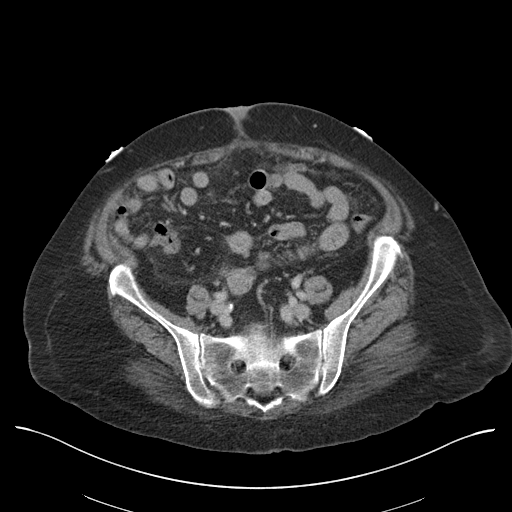
[im 40/79  soft-tissue]
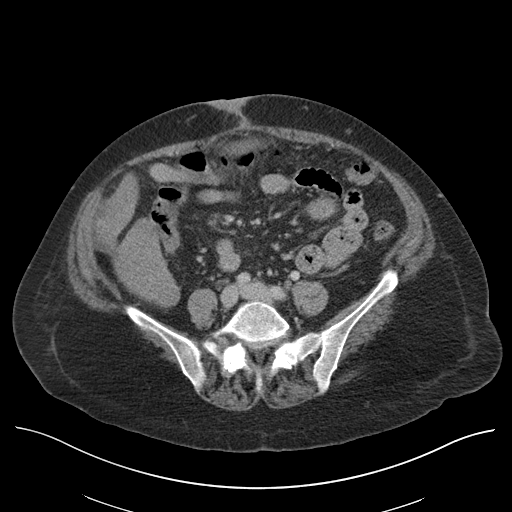
[im 45/79  soft-tissue]
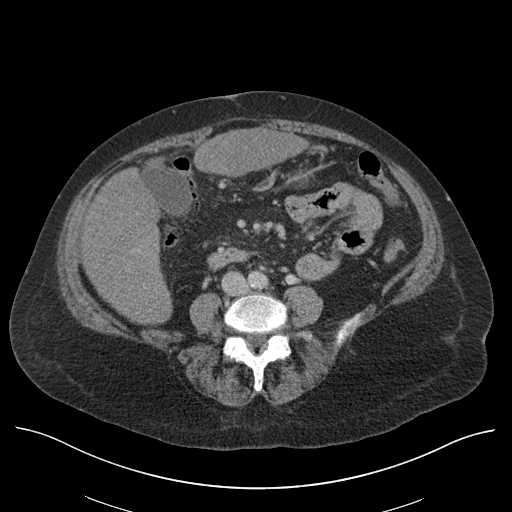
[im 51/79  soft-tissue]
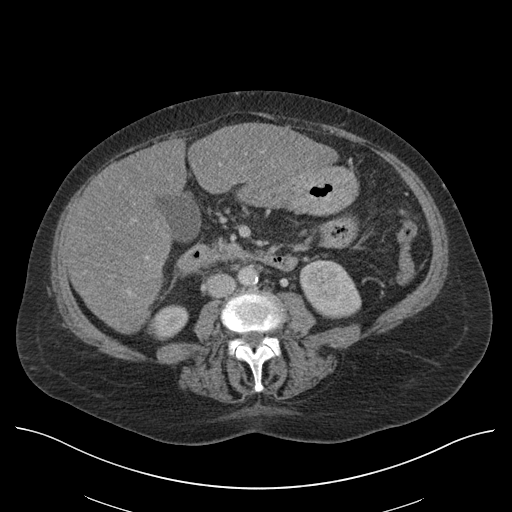
[im 51/79  bone]
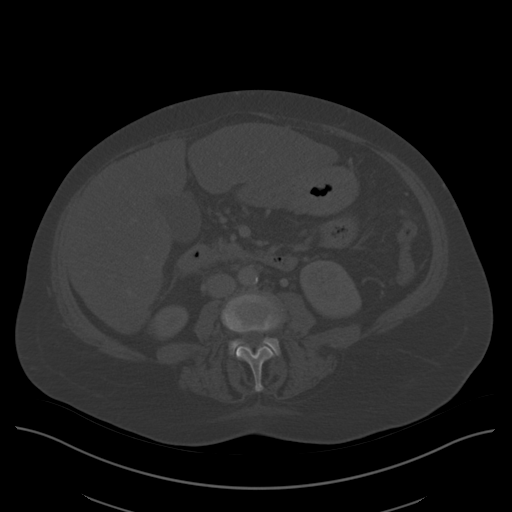
[im 56/79  soft-tissue]
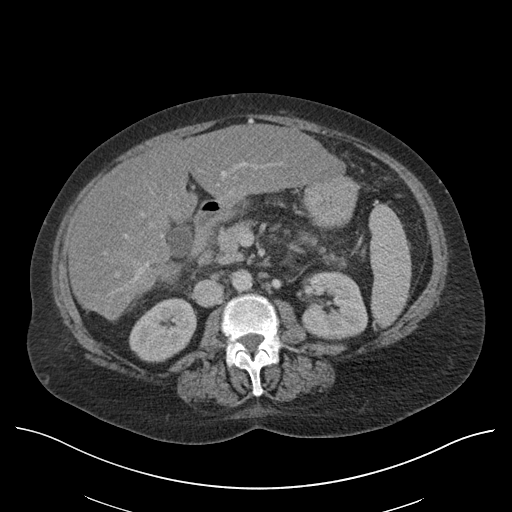
[im 62/79  soft-tissue]
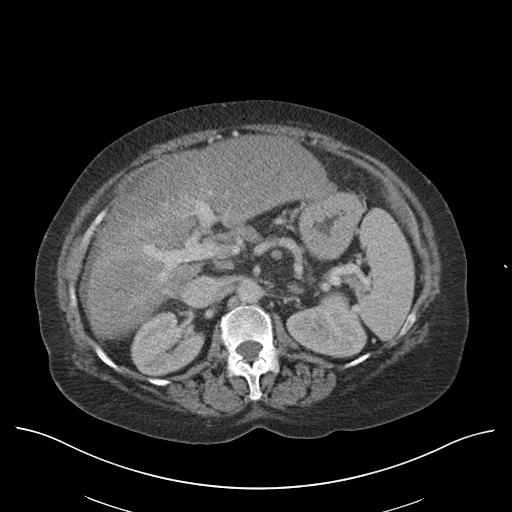
[im 67/79  soft-tissue]
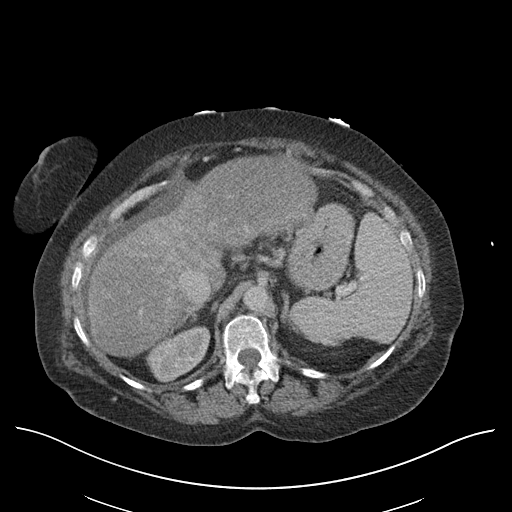
[im 73/79  soft-tissue]
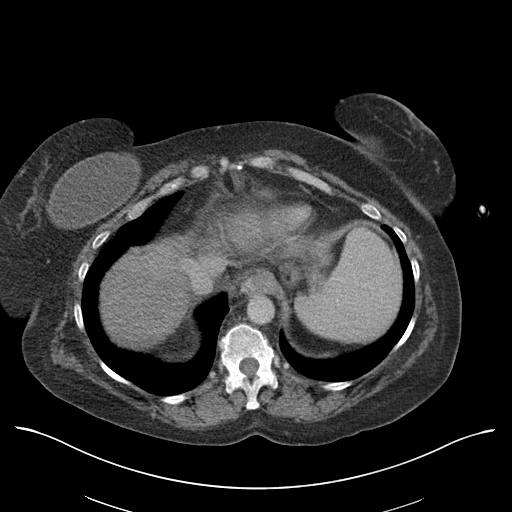

[Series 5: coronal st · coronal · 0.75mm/px · 3 of 112 slices shown]
[im 38/112  soft-tissue]
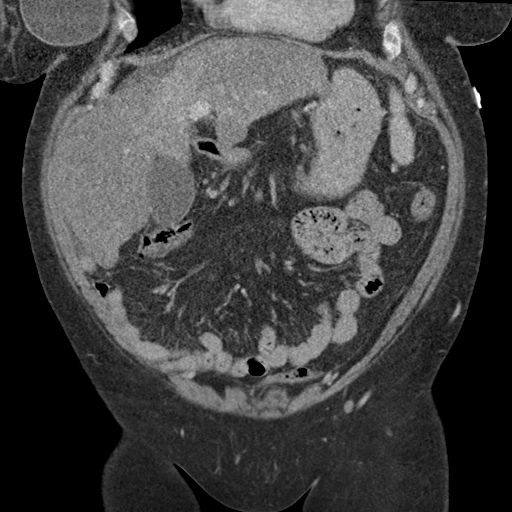
[im 50/112  soft-tissue]
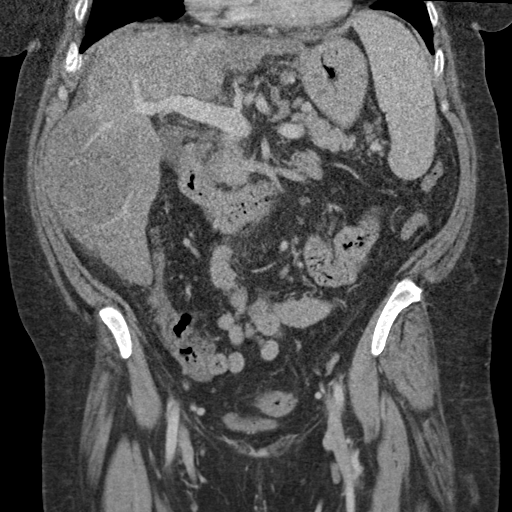
[im 62/112  soft-tissue]
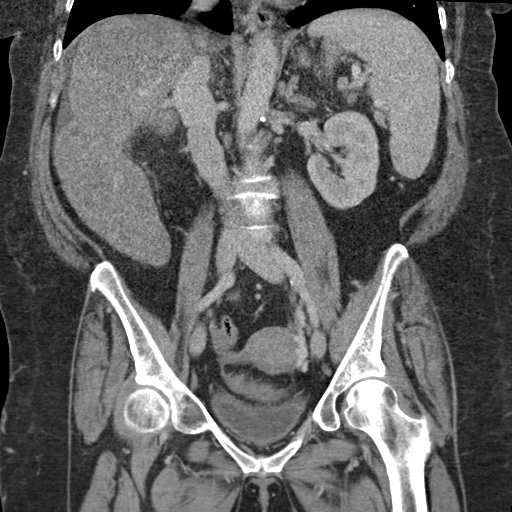

[16 of 46 positions shown; findings below may reference images not displayed]

FINDINGS: Lower chest: Lung bases are clear without focal nodule, mass, or
airspace disease. Heart size is normal. No significant pleural or
pericardial effusion is present. Bilateral breast implants are
noted.

Hepatobiliary: Changes of hepatic cirrhosis are again noted. No
discrete lesion is present. The common bile duct and gallbladder are
within normal limits.

Pancreas: Unremarkable. No pancreatic ductal dilatation or
surrounding inflammatory changes.

Spleen: Spleen is enlarged, measuring 12.6 cm. No focal lesions are
present..

Adrenals/Urinary Tract: Adrenal glands are normal bilaterally.
Kidneys and ureters are within normal limits. The urinary bladder is
unremarkable.

Stomach/Bowel: Stomach and duodenum are within normal limits. Small
bowel is unremarkable.

Vascular/Lymphatic: Multiple gastric varices are again noted.
Atherosclerotic calcifications are present in the distal aorta
without aneurysm or stenosis.

Reproductive: Uterus and bilateral adnexa are unremarkable.

Other: A small amount of free fluid is present about the liver and
along the right side of the pelvis. No significant ventral hernia is
present.

Musculoskeletal: Healing right sixth, seventh, eighth, and ninth
anterolateral rib fractures are noted.
IMPRESSION: 1. Similar findings of hepatic cirrhosis.
2. Sequelae of splenomegaly and gastric varices.
3. Small volume ascites is similar the prior exam.
4. Healing right-sided rib fractures.
5. No acute abnormality.

## 2020-03-15 IMAGING — CR DG ABDOMEN ACUTE W/ 1V CHEST
3 series · 3 of 3 positions shown · non-contrast
Comparison: 04/18/2017

CLINICAL DATA: Diarrhea over the last 3 days.

EXAM:
DG ABDOMEN ACUTE W/ 1V CHEST

[w chest pa]
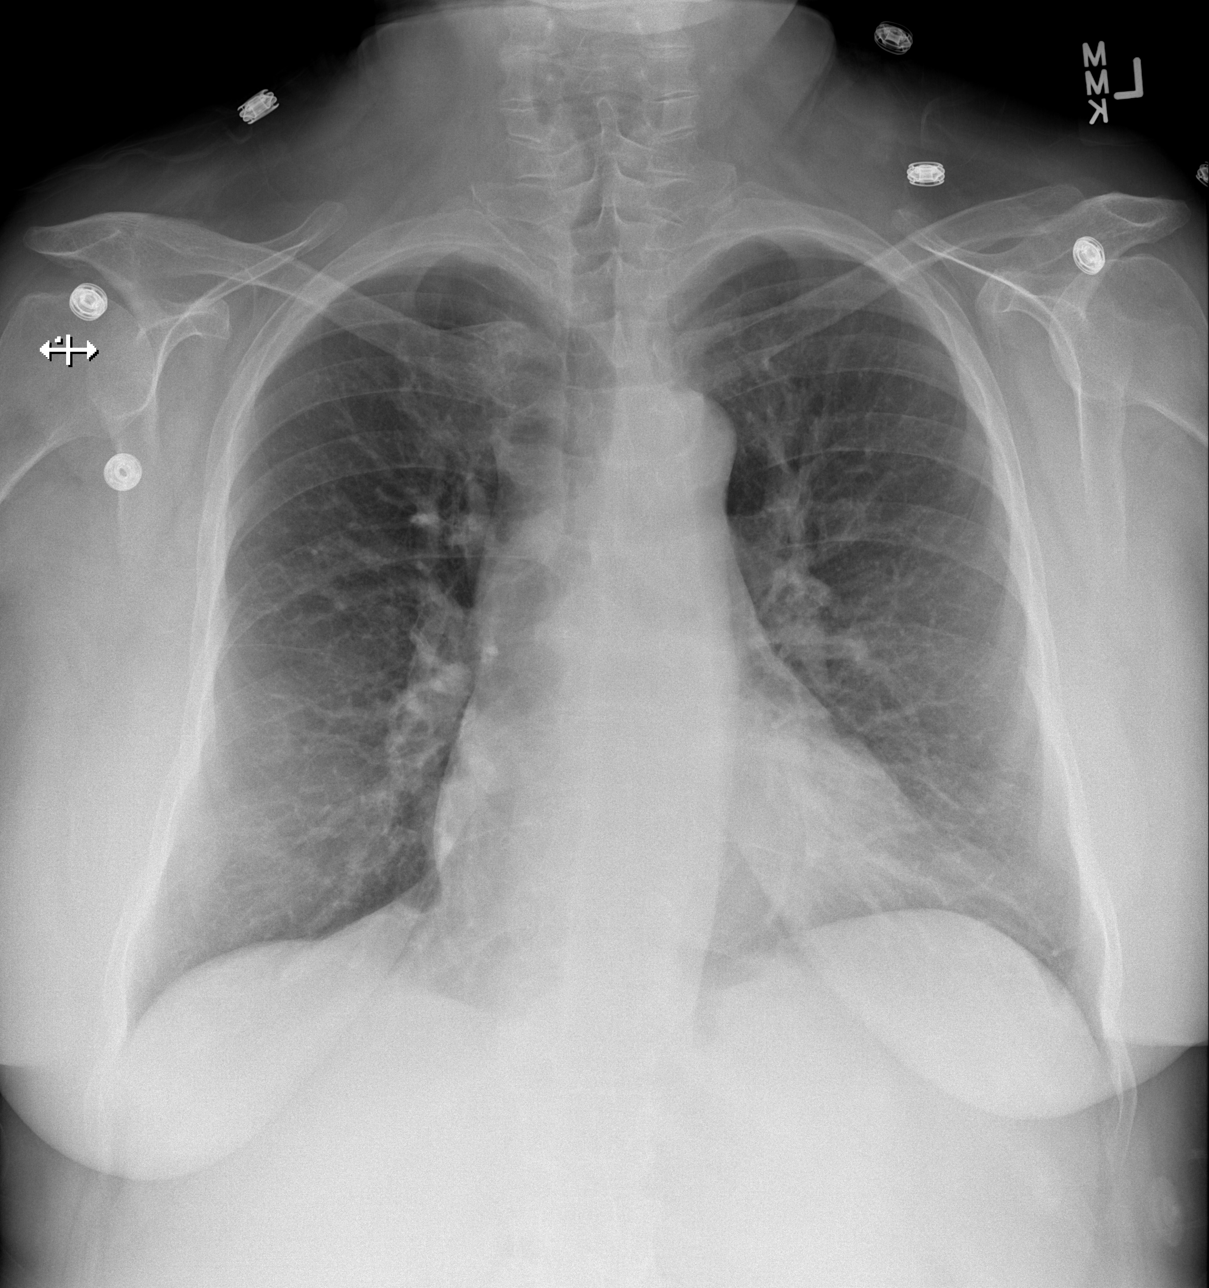

[w abdomen upright]
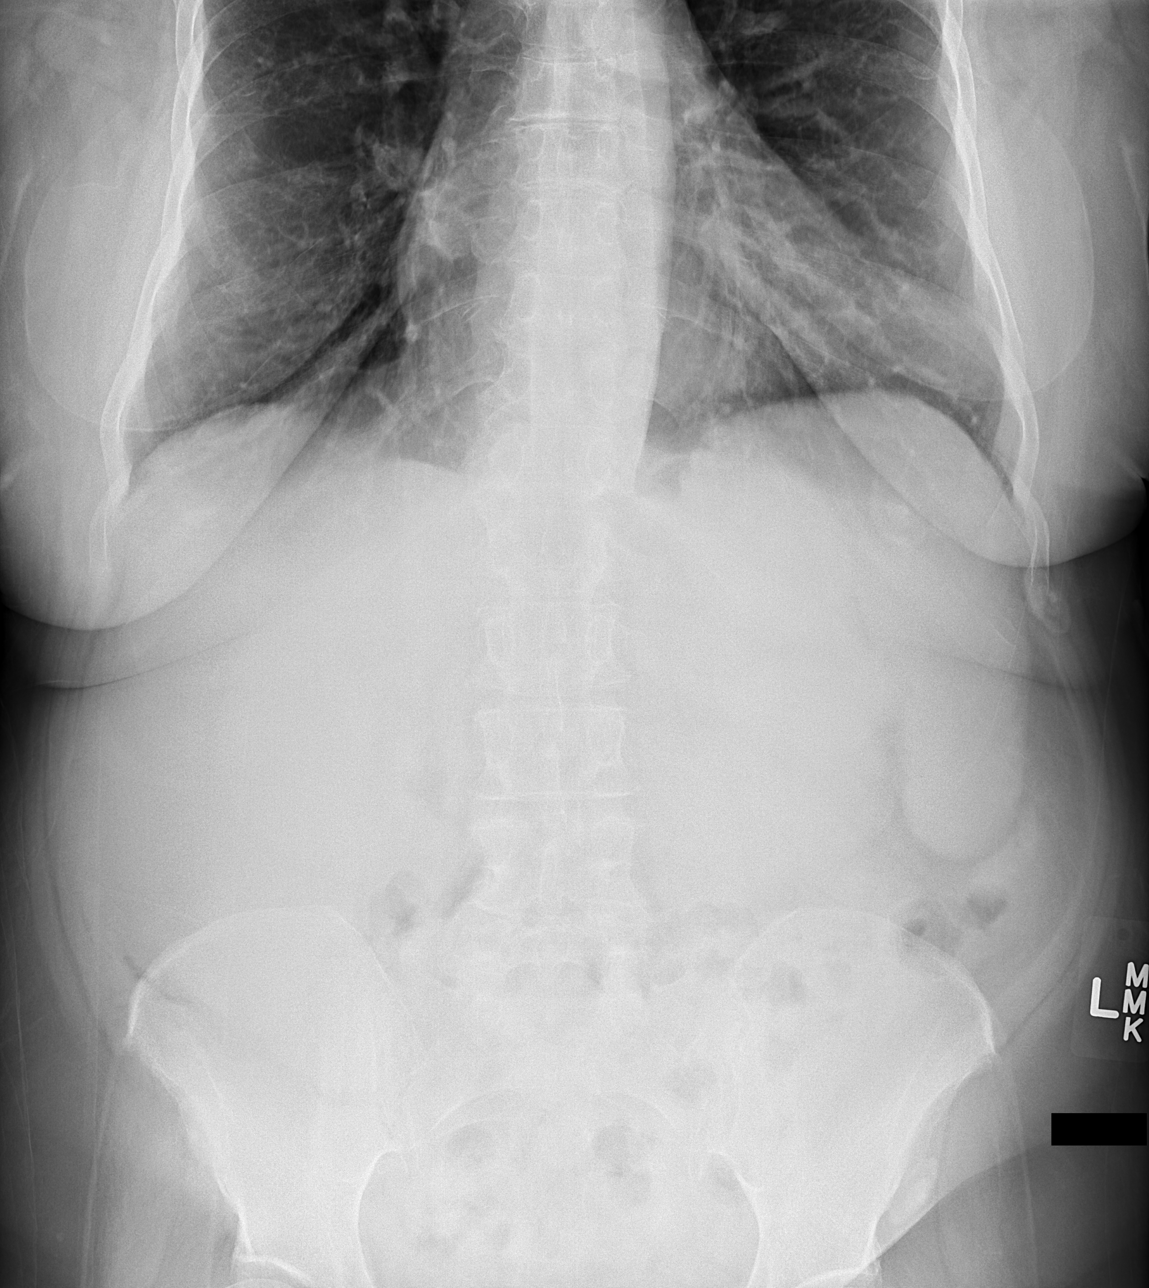

[t abdomen supine]
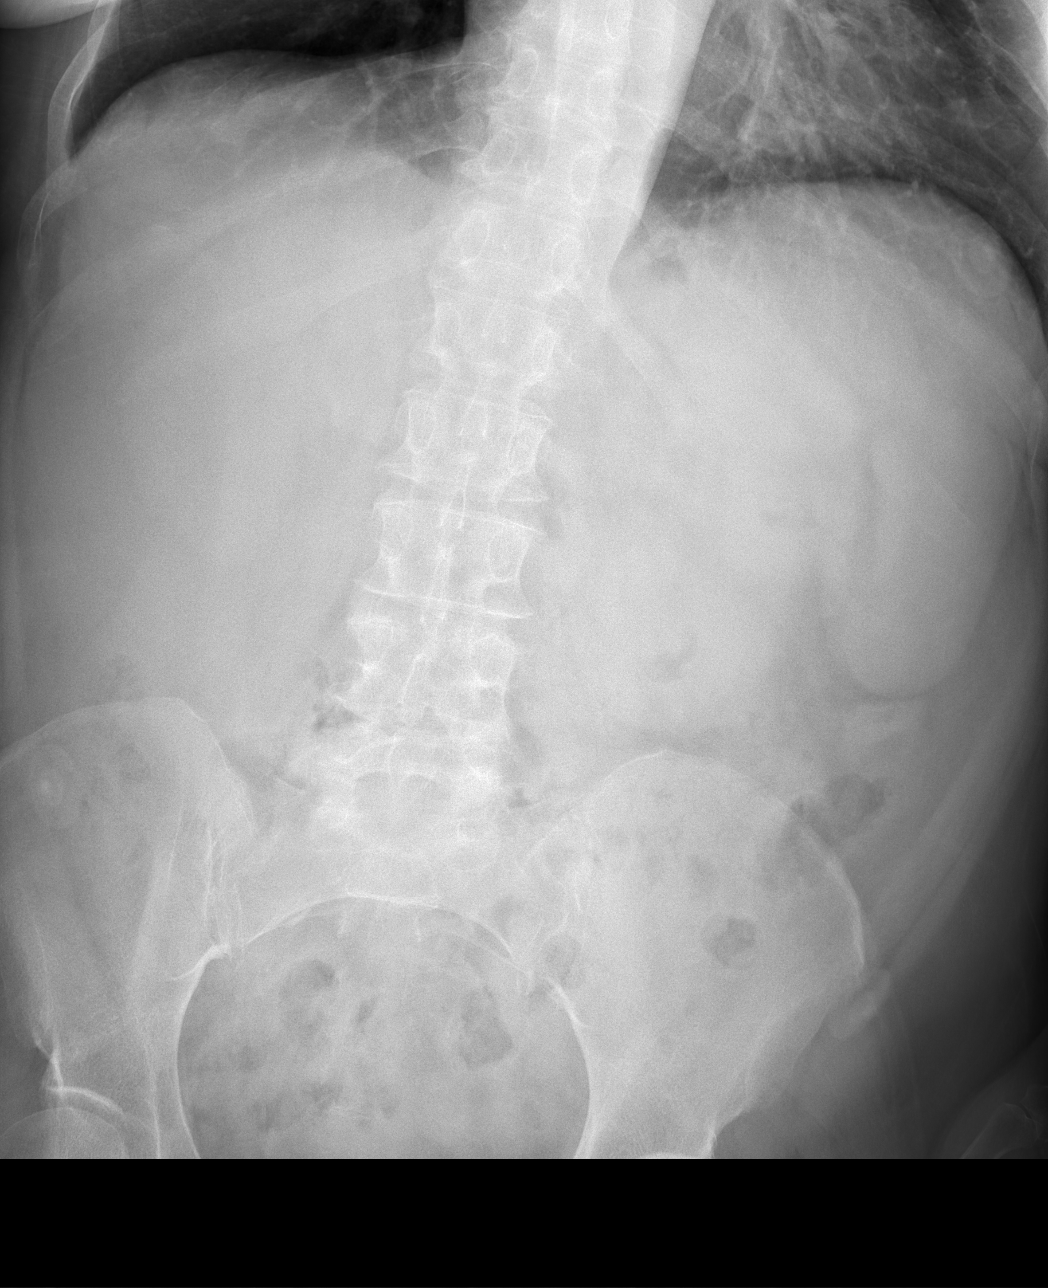

[3 of 3 positions shown; findings below may reference images not displayed]

FINDINGS: Bowel gas pattern does not show evidence ileus, obstruction or free
air. No abnormal calcifications or significant bone findings.

One-view chest shows normal heart size and mediastinal shadows. The
lungs are clear. Healed or healing proximal clavicle fracture on the
right.
IMPRESSION: Negative abdominal radiographs.  No acute cardiopulmonary disease.

## 2020-04-08 IMAGING — CR DG CHEST 2V
2 series · 2 of 2 positions shown · non-contrast
Comparison: Chest x-ray of May 06, 2017

CLINICAL DATA: Right-sided chest pain associated with shortness of
breath, nausea, dizziness, and tachycardia. History of COPD,
cirrhosis, multiple scleroses, current smoker.

EXAM:
CHEST - 2 VIEW

[chest lat]
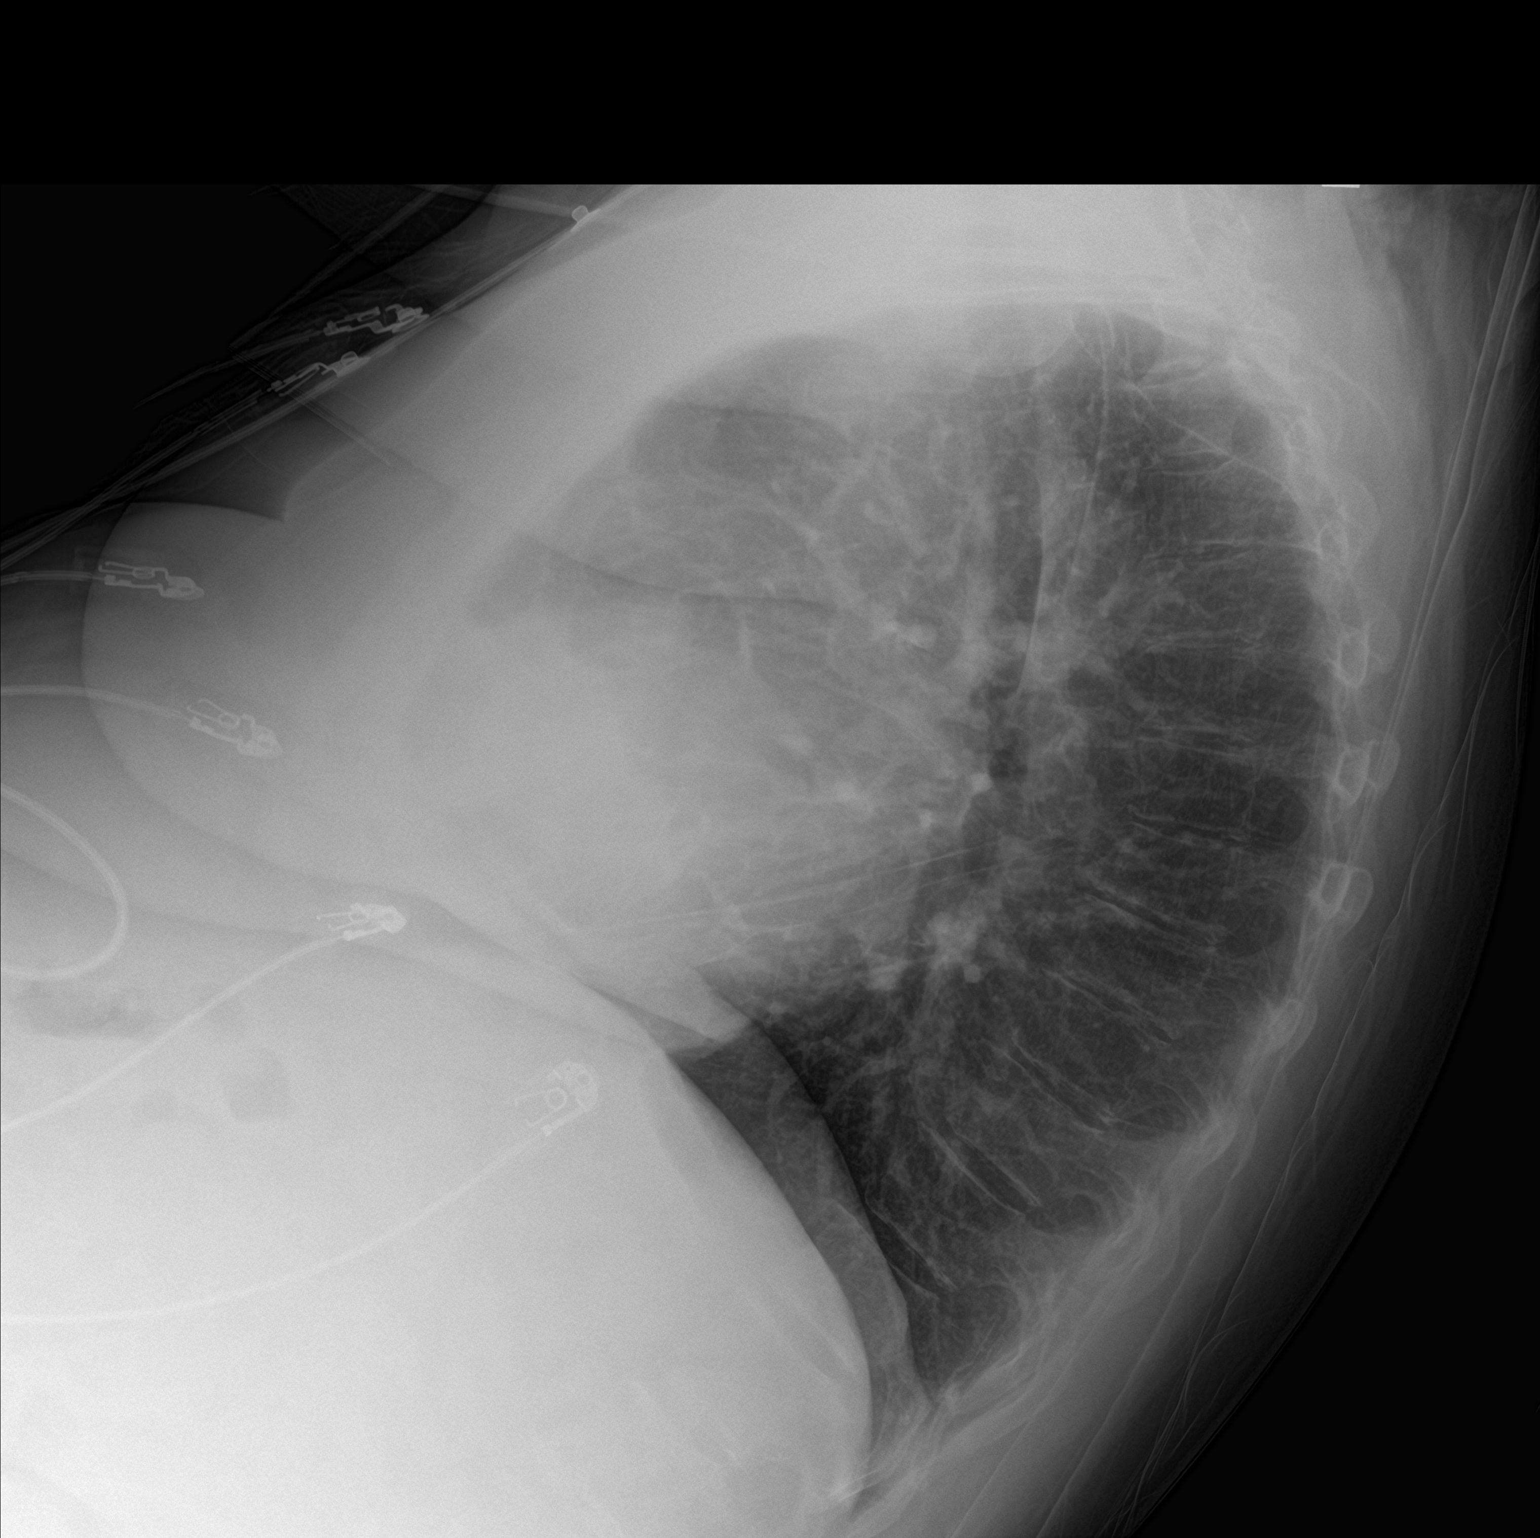

[chest ap]
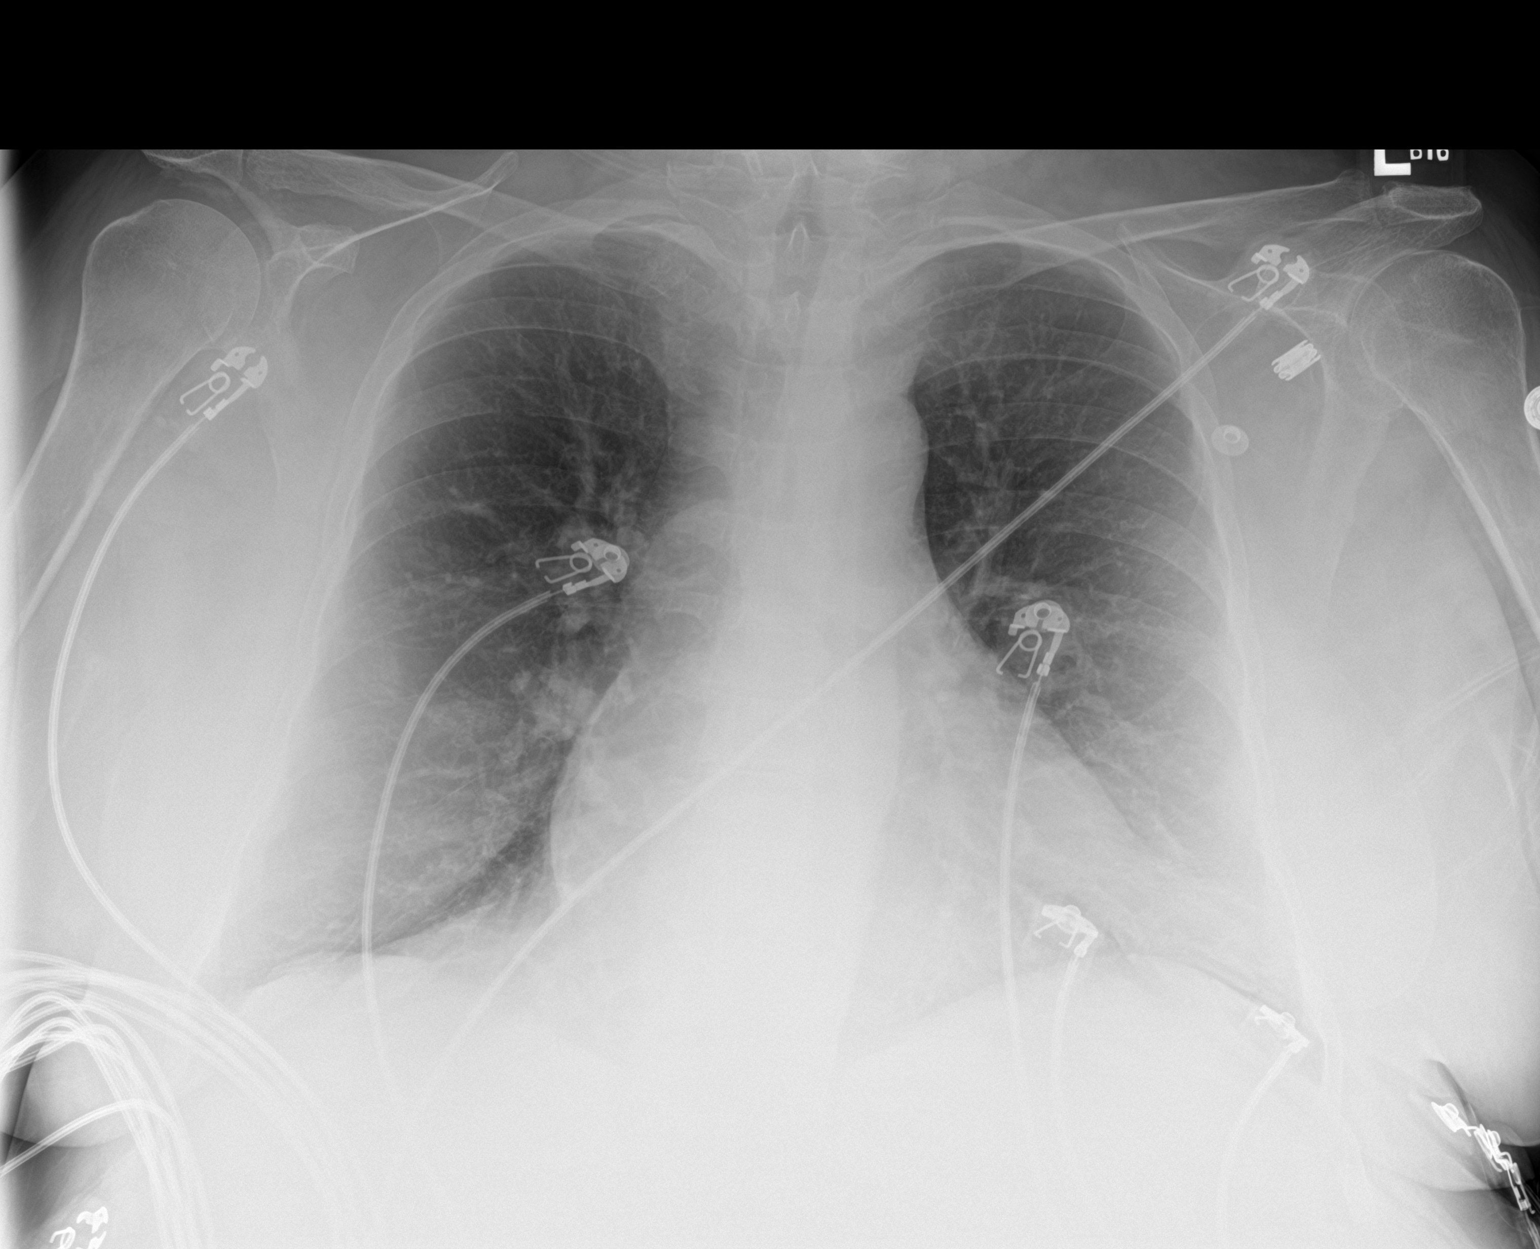

[2 of 2 positions shown; findings below may reference images not displayed]

FINDINGS: The lungs are adequately inflated. There is no focal infiltrate.
There is no pleural effusion or pneumothorax. The cardiac silhouette
is mildly enlarged. The pulmonary vascularity is normal. There is
calcification in the wall of the aortic arch. The trachea is
midline. The bony thorax exhibits no acute abnormality. There is
multilevel degenerative disc disease of the thoracic spine.
IMPRESSION: Mild cardiomegaly without pulmonary edema. Mild chronic
bronchitic-smoking related changes.

## 2020-04-21 IMAGING — CT CT ABD-PELV W/ CM
2 of 5 series · 15 of 46 positions shown, 17 images · IV contrast (iopamidol)
Comparison: Chest CT 01/18/2017.  Abdominopelvic CT 05/06/2017.

CLINICAL DATA: Chest and abdominal pain with nausea. Atrial
fibrillation. Abdominal surgical wound. Clinical suspicion of
pulmonary embolism.

EXAM:
CT ANGIOGRAPHY CHEST
CT ABDOMEN AND PELVIS WITH CONTRAST
TECHNIQUE: Multidetector CT imaging of the chest was performed using the
standard protocol during bolus administration of intravenous
contrast. Multiplanar CT image reconstructions and MIPs were
obtained to evaluate the vascular anatomy. Multidetector CT imaging
of the abdomen and pelvis was performed using the standard protocol
during bolus administration of intravenous contrast.
CONTRAST:  100mL TICO9L-EGU IOPAMIDOL (TICO9L-EGU) INJECTION 76%

[Series 2: axial st · axial · 0.85mm/px · z∈[-419,-44]mm · 12 of 89 slices shown, 14 images]
[im 7/89  soft-tissue]
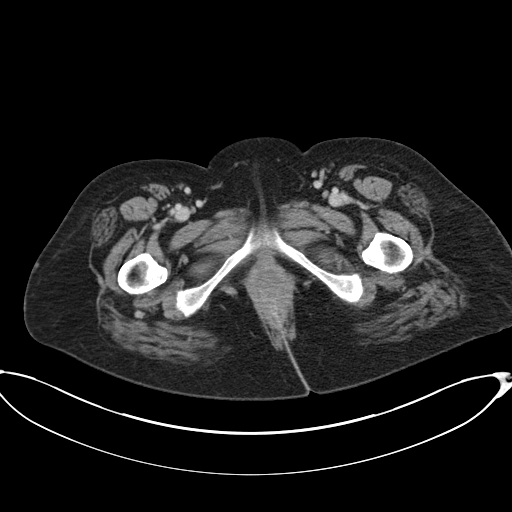
[im 7/89  bone]
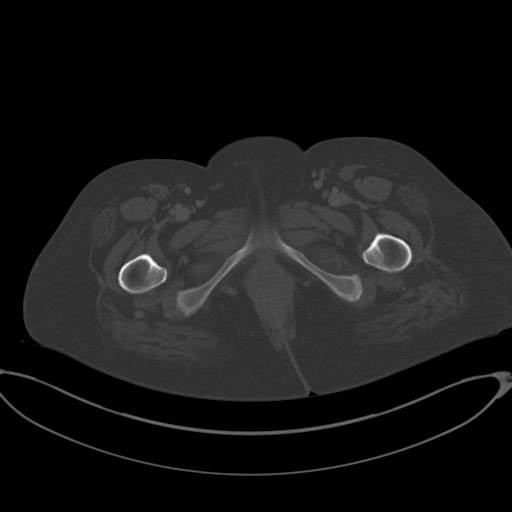
[im 14/89  soft-tissue]
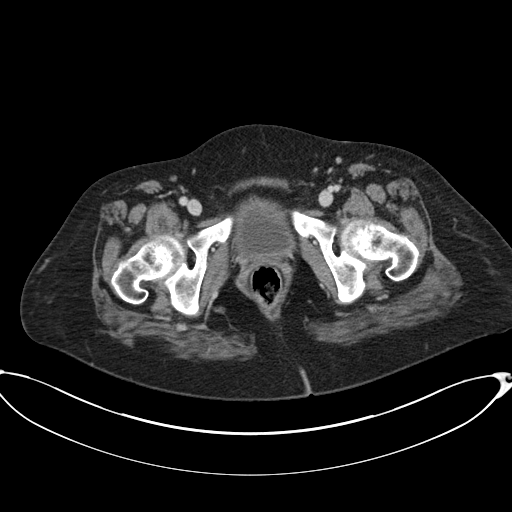
[im 21/89  soft-tissue]
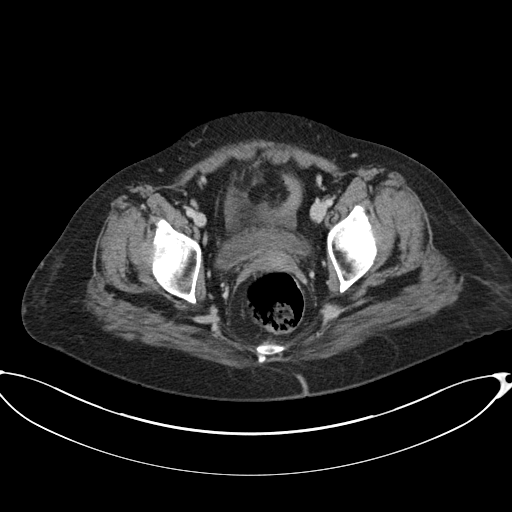
[im 28/89  soft-tissue]
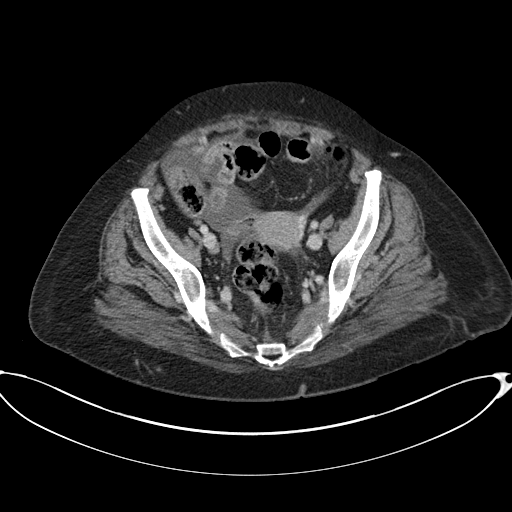
[im 34/89  soft-tissue]
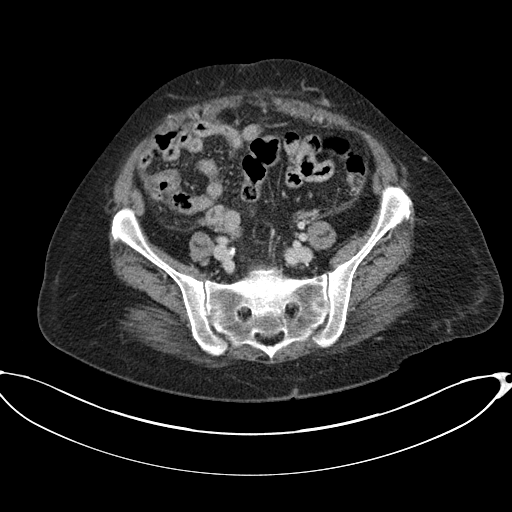
[im 41/89  soft-tissue]
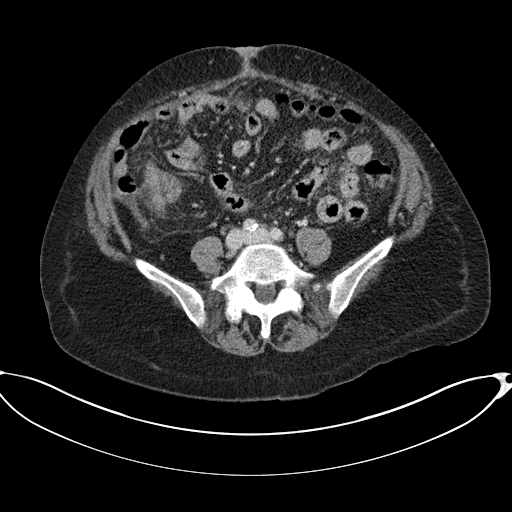
[im 48/89  soft-tissue]
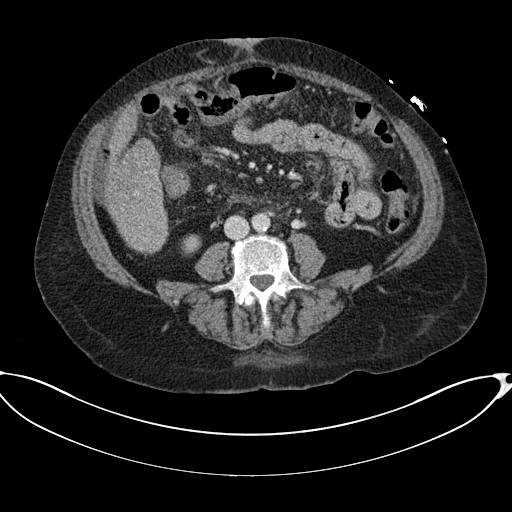
[im 55/89  soft-tissue]
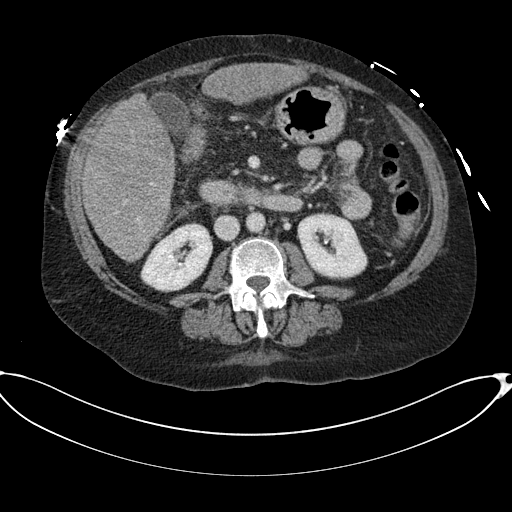
[im 61/89  soft-tissue]
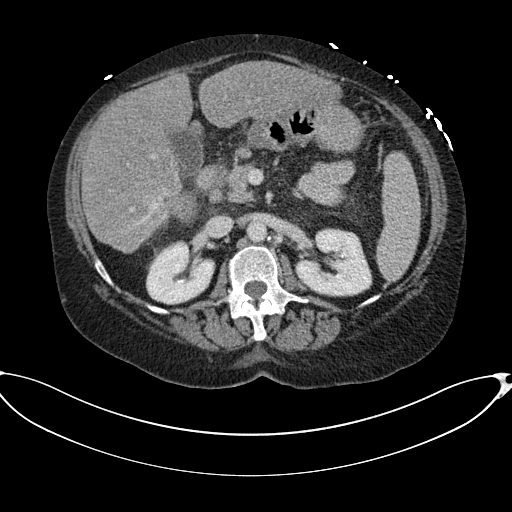
[im 61/89  bone]
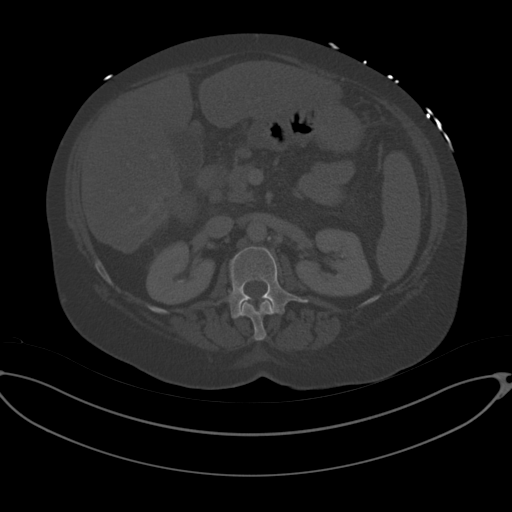
[im 68/89  soft-tissue]
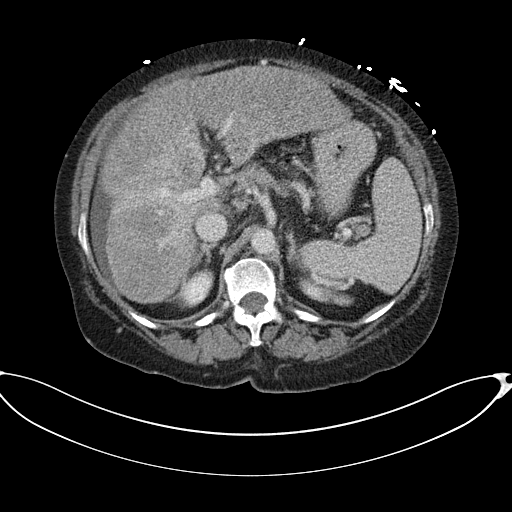
[im 75/89  soft-tissue]
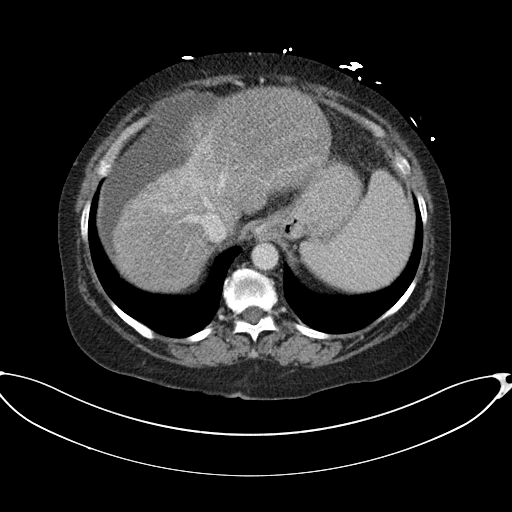
[im 82/89  soft-tissue]
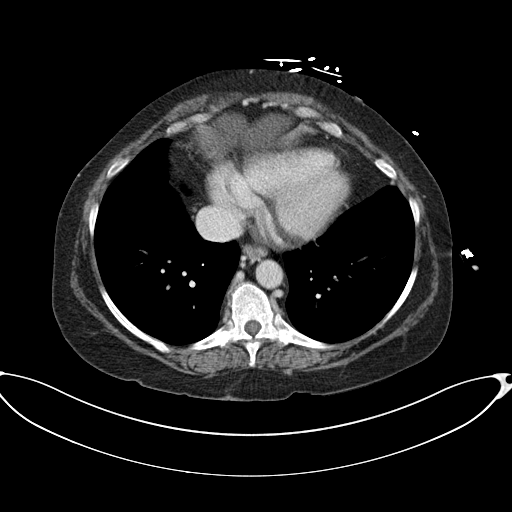

[Series 4: coronal st · coronal · 0.84mm/px · 3 of 120 slices shown]
[im 40/120  soft-tissue]
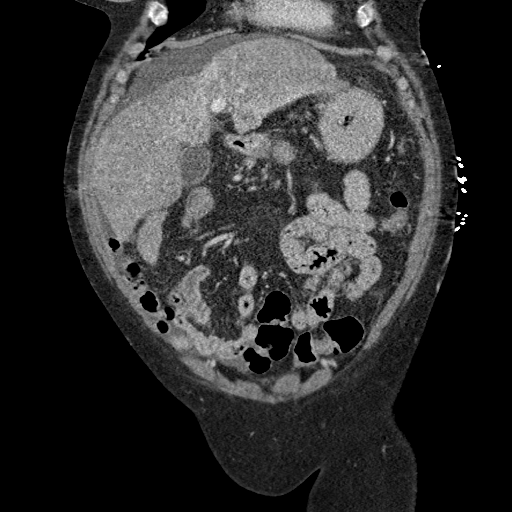
[im 53/120  soft-tissue]
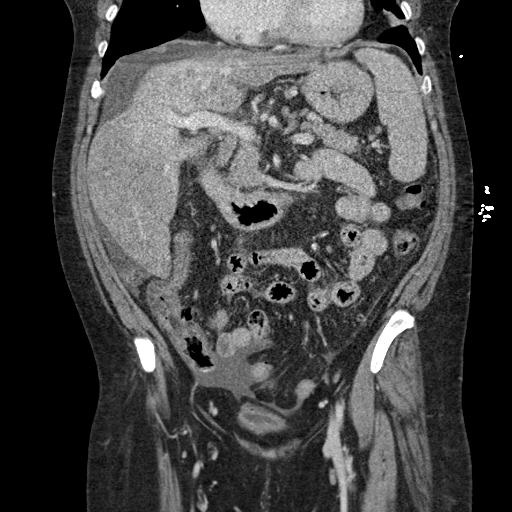
[im 67/120  soft-tissue]
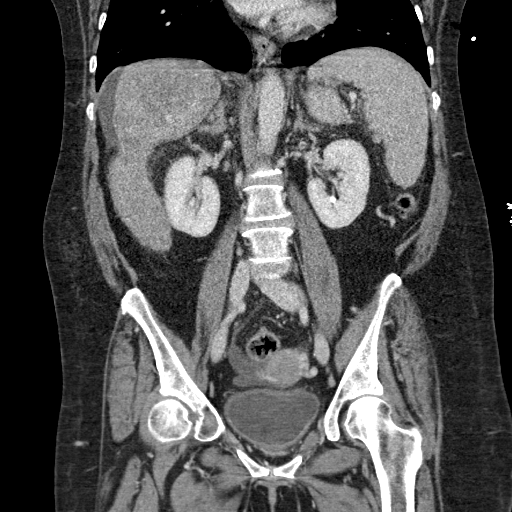

[15 of 46 positions shown; findings below may reference images not displayed]

FINDINGS: CTA CHEST FINDINGS

Cardiovascular: The pulmonary arteries are well opacified with
contrast to the level of the subsegmental branches. There is no
evidence of acute pulmonary embolism. Minimal aortic atherosclerosis
without acute systemic arterial abnormalities. The heart size is
normal. There is no pericardial effusion.

Mediastinum/Nodes: There are no enlarged mediastinal, hilar or
axillary lymph nodes. The thyroid gland, trachea and esophagus
demonstrate no significant findings.

Lungs/Pleura: There is no pleural effusion. The lungs are clear.

Musculoskeletal/Chest wall: There is no chest wall mass or
suspicious osseous finding. Bilateral subpectoral breast implants.
Several old rib fractures on the right, incompletely healed.

Review of the MIP images confirms the above findings.

CT ABDOMEN and PELVIS FINDINGS

Hepatobiliary: Changes of hepatic cirrhosis are again noted. There
is progressive capsular retraction, volume loss and ill-defined
increased density anteriorly in the right hepatic lobe. Ill-defined
hyperdense 18 mm lesion in the left lobe on image [DATE] is stable. No
evidence of gallstones, gallbladder wall thickening or biliary
dilatation.

Pancreas: Unremarkable. No pancreatic ductal dilatation or
surrounding inflammatory changes.

Spleen: Spleen size is stable at the upper limits of normal. No
focal abnormality.

Adrenals/Urinary Tract: Both adrenal glands appear normal. Both
kidneys appear normal without evidence of urinary tract calculus,
renal mass hydronephrosis. The bladder appears normal.

Stomach/Bowel: No enteric contrast was administered. The stomach and
right colon are incompletely distended. Allowing for this, no
apparent bowel wall thickening or surrounding inflammation. The
appendix appears normal.

Vascular/Lymphatic: Several prominent lymph nodes in the porta
hepatis and gastrohepatic ligament are similar to the previous
study, likely reactive and related to the patient's cirrhosis. There
is no retroperitoneal lymphadenopathy. There is mild aortic and
branch vessel atherosclerosis. Small gastric and distal esophageal
varices. Recanalized left paraumbilical vein. Stable mild dilatation
of the left ovarian vein.

Reproductive: The uterus and ovaries appear normal. No evidence of
adnexal mass.

Other: Stable postsurgical changes in the periumbilical anterior
abdominal wall with skin thickening and increased density in the
underlying subcutaneous fat. There is no focal fluid collection or
hernia. There is a small amount of ascites around the liver and in
the pelvis, increased from the prior study.

Musculoskeletal: No acute osseous findings. Chronic sclerosis of the
left femoral head without collapse, consistent with mild chronic
avascular necrosis. Right iliopsoas bursal fluid collection appears
stable.
IMPRESSION: 1. No evidence of acute pulmonary embolism or other acute chest
findings.
2. Healing right-sided rib fractures.
3. Progressive changes of hepatic cirrhosis with probable confluent
hepatic fibrosis. Hypervascular lesion in the left lobe is stable.
4. Stable findings consistent with mild portal hypertension with
borderline splenomegaly, varices and increasing ascites.
5.  Aortic Atherosclerosis ([id number]-PDP.P).

## 2020-04-21 IMAGING — CR DG CHEST 2V
2 series · 2 of 2 positions shown · non-contrast
Comparison: 05/30/2017 chest radiograph.

CLINICAL DATA: Cough and dyspnea

EXAM:
CHEST - 2 VIEW

[w chest lat]
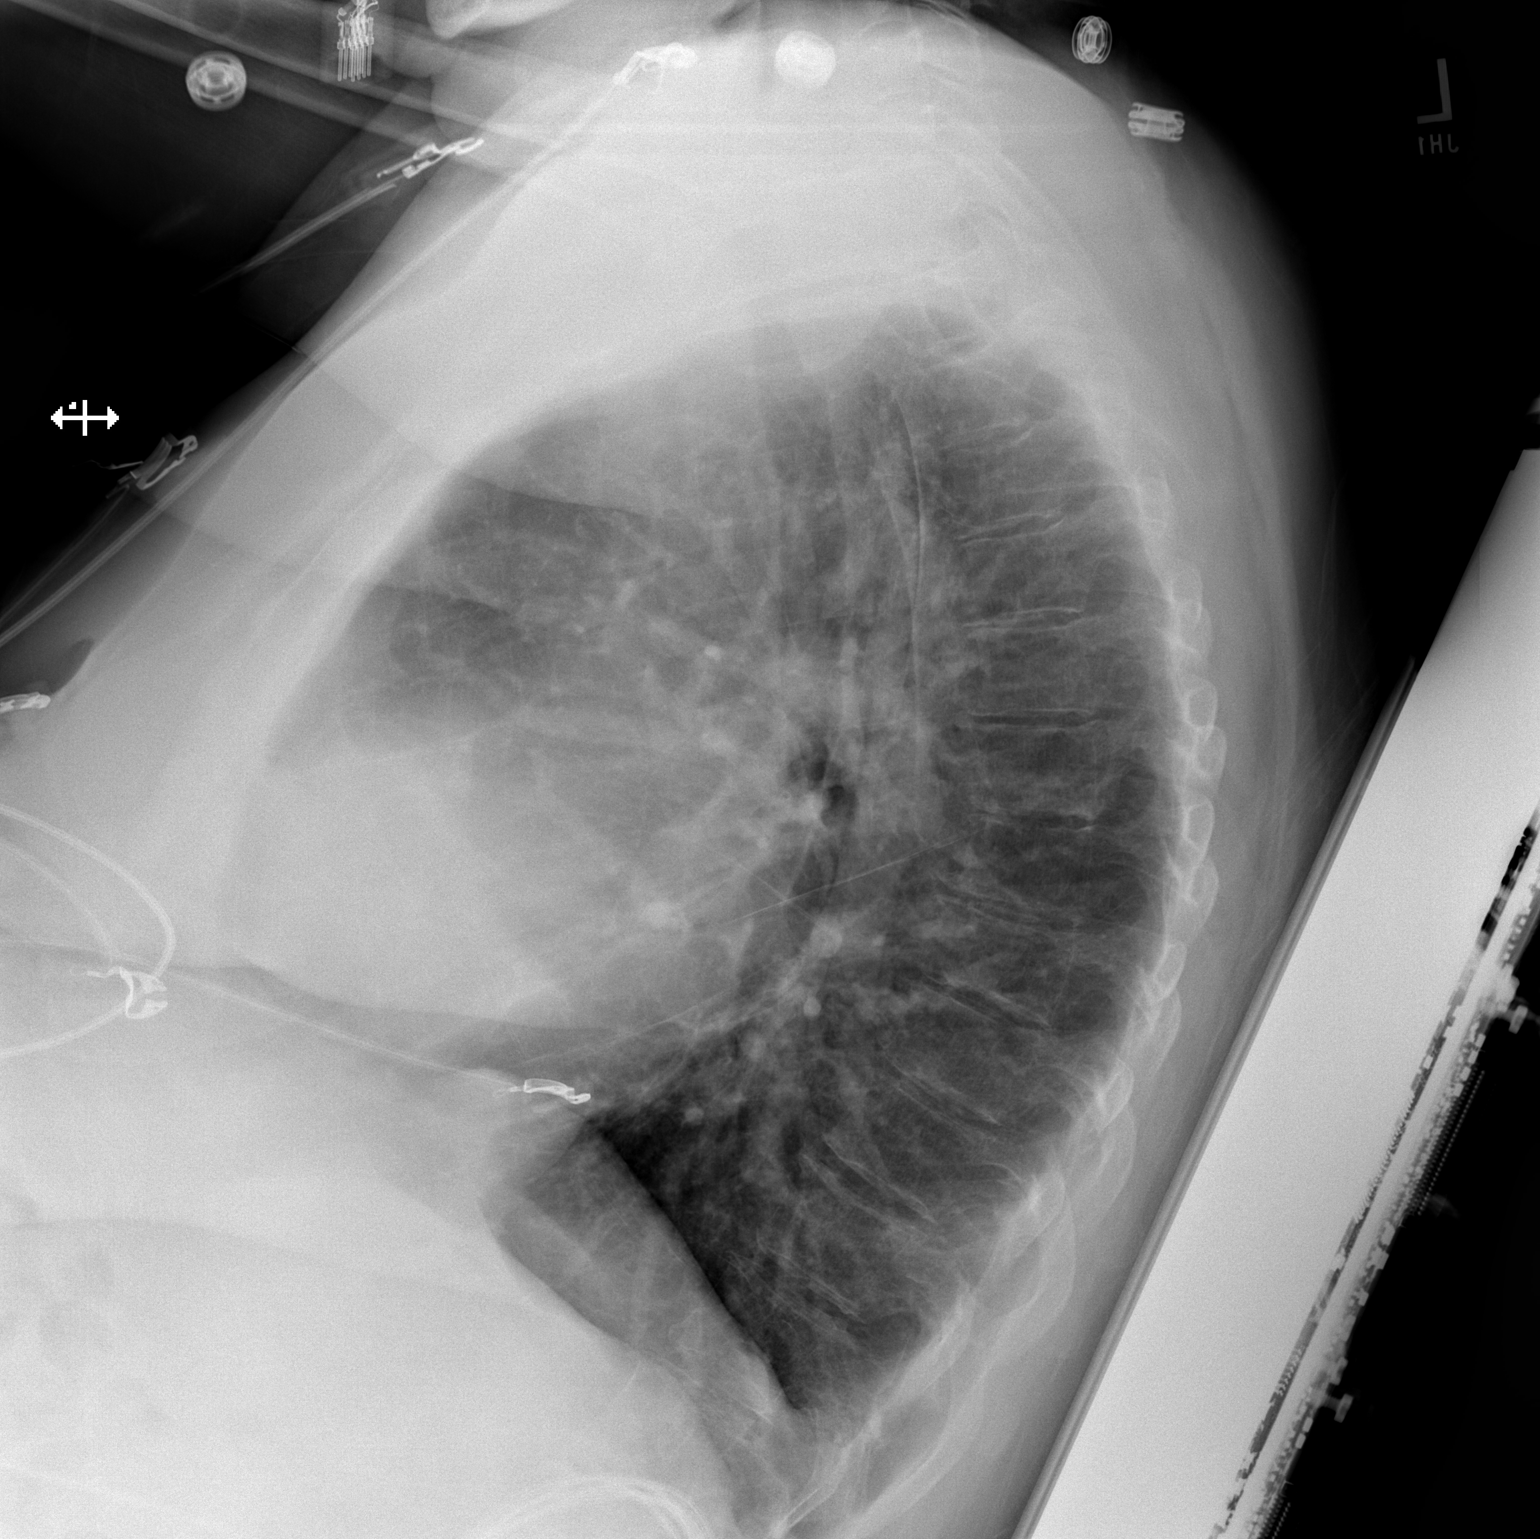

[x chest ap]
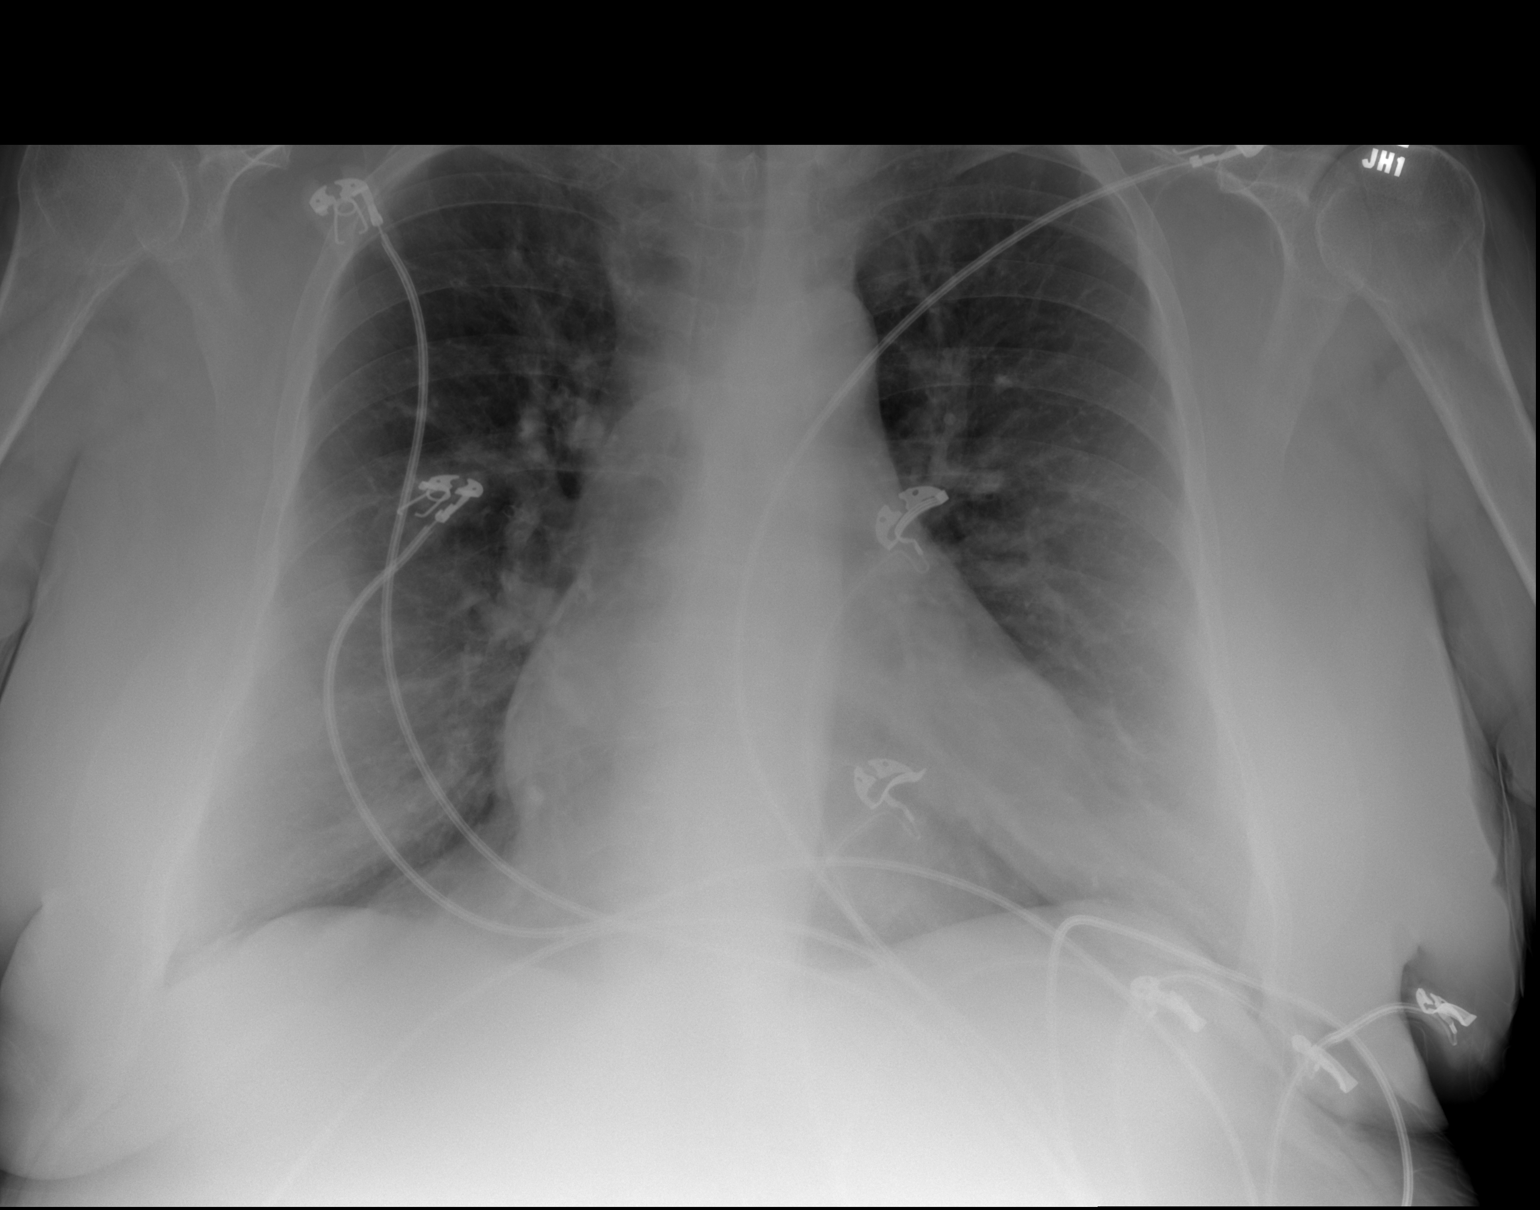

[2 of 2 positions shown; findings below may reference images not displayed]

FINDINGS: Stable cardiomediastinal silhouette with mild cardiomegaly. No
pneumothorax. No pleural effusion. Lungs appear clear, with no acute
consolidative airspace disease and no pulmonary edema.
IMPRESSION: Stable mild cardiomegaly without pulmonary edema. No active
pulmonary disease.

## 2020-04-21 IMAGING — CT CT ANGIO CHEST
2 of 6 series · 16 of 46 positions shown · IV contrast (ISOVUE)
Comparison: Chest CT 01/18/2017.  Abdominopelvic CT 05/06/2017.

CLINICAL DATA: Chest and abdominal pain with nausea. Atrial
fibrillation. Abdominal surgical wound. Clinical suspicion of
pulmonary embolism.

EXAM:
CT ANGIOGRAPHY CHEST
CT ABDOMEN AND PELVIS WITH CONTRAST
TECHNIQUE: Multidetector CT imaging of the chest was performed using the
standard protocol during bolus administration of intravenous
contrast. Multiplanar CT image reconstructions and MIPs were
obtained to evaluate the vascular anatomy. Multidetector CT imaging
of the abdomen and pelvis was performed using the standard protocol
during bolus administration of intravenous contrast.
CONTRAST:  100mL TICO9L-EGU IOPAMIDOL (TICO9L-EGU) INJECTION 76%

[Series 6: thins · axial · 0.62mm/px · z∈[-119,+162]mm · 13 of 309 slices shown]
[im 14/309  lung]
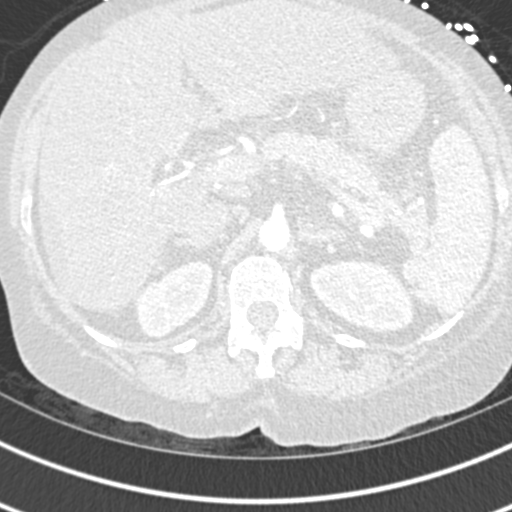
[im 41/309  soft-tissue]
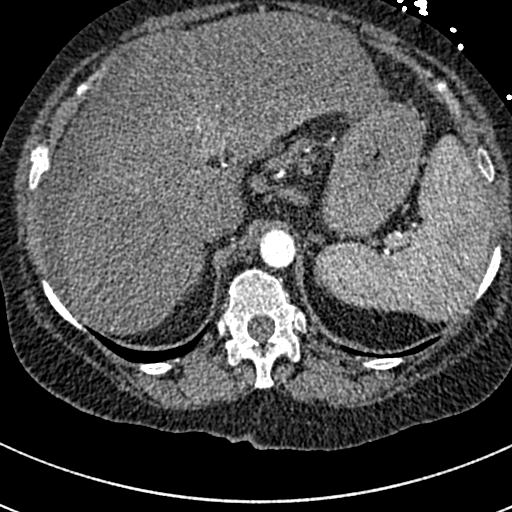
[im 67/309  lung]
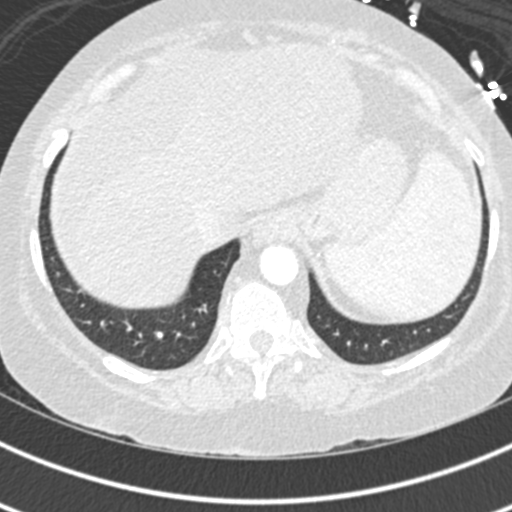
[im 81/309  soft-tissue]
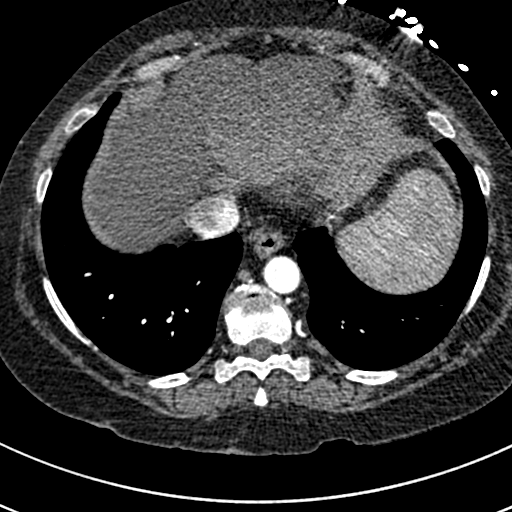
[im 108/309  lung]
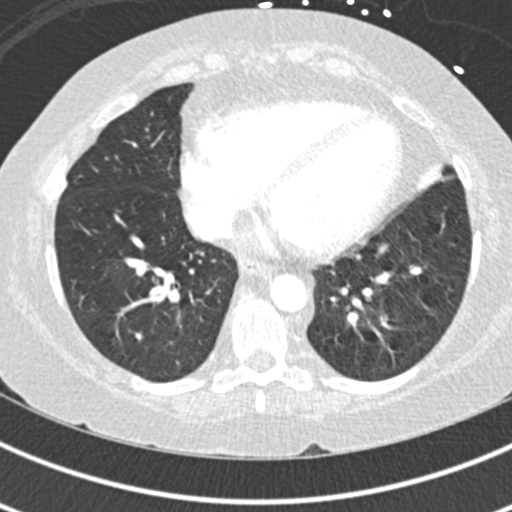
[im 134/309  soft-tissue]
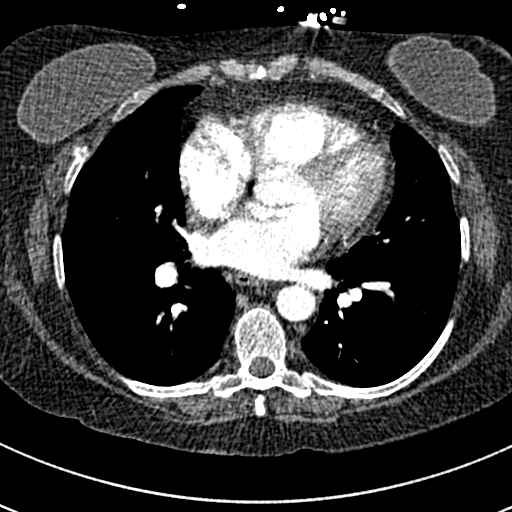
[im 161/309  lung]
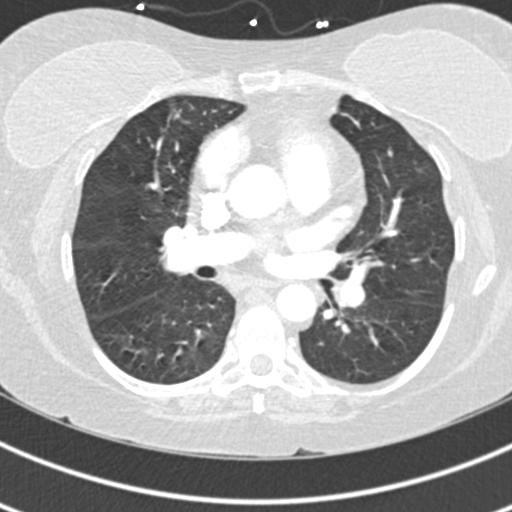
[im 175/309  soft-tissue]
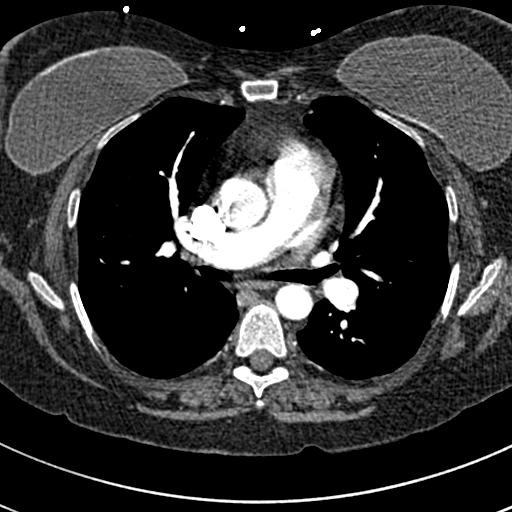
[im 201/309  lung]
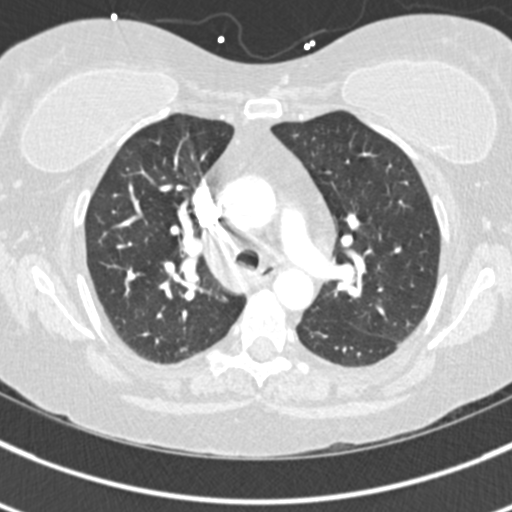
[im 228/309  soft-tissue]
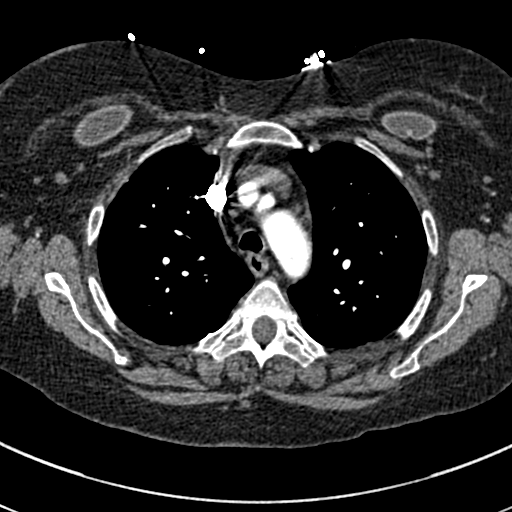
[im 242/309  lung]
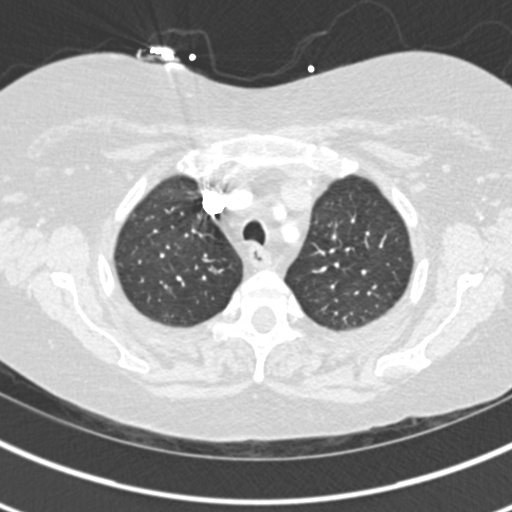
[im 268/309  soft-tissue]
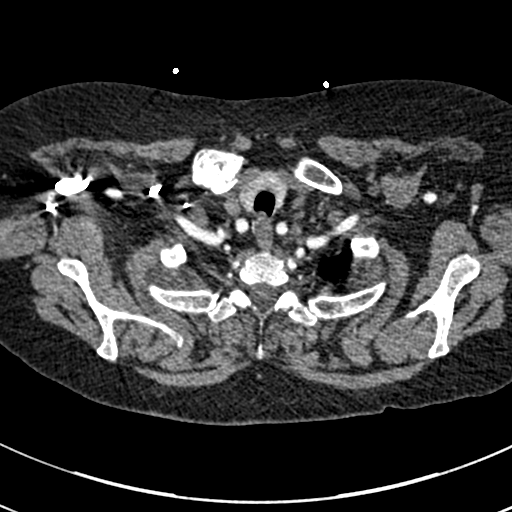
[im 295/309  lung]
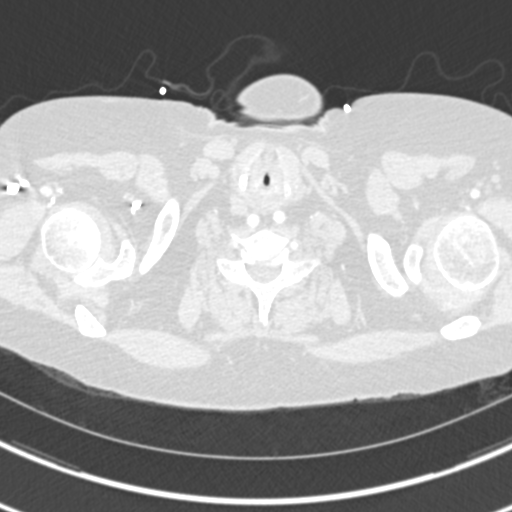

[Series 8: coronal mpr · coronal · 0.61mm/px · 3 of 151 slices shown]
[im 38/151  soft-tissue]
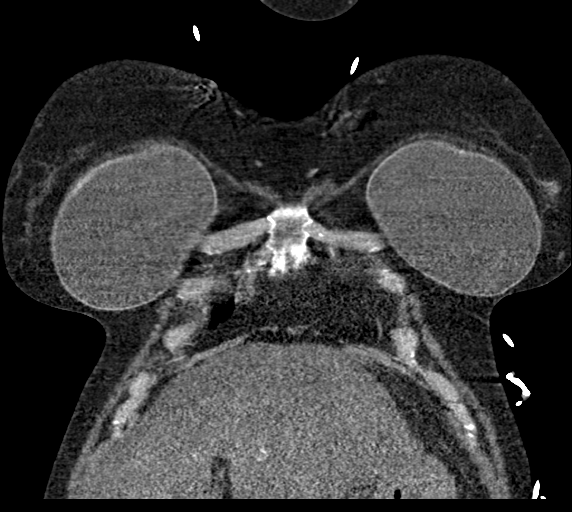
[im 76/151  soft-tissue]
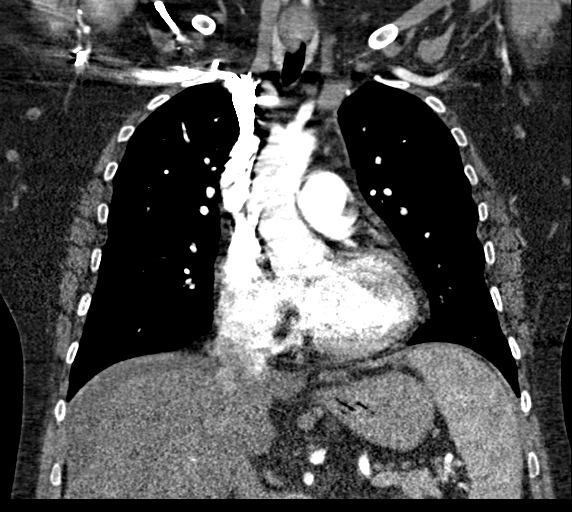
[im 113/151  soft-tissue]
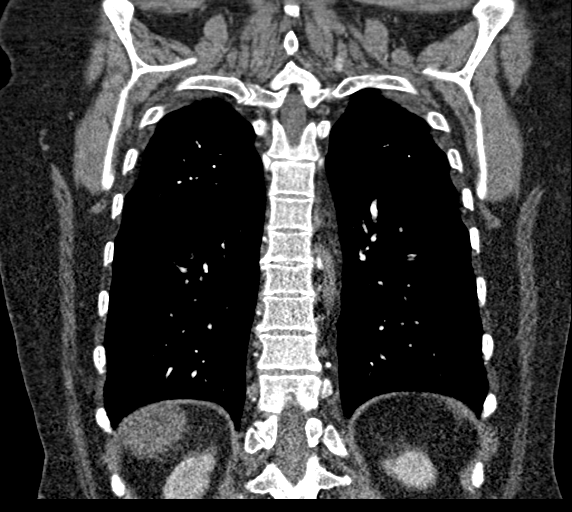

[16 of 46 positions shown; findings below may reference images not displayed]

FINDINGS: CTA CHEST FINDINGS

Cardiovascular: The pulmonary arteries are well opacified with
contrast to the level of the subsegmental branches. There is no
evidence of acute pulmonary embolism. Minimal aortic atherosclerosis
without acute systemic arterial abnormalities. The heart size is
normal. There is no pericardial effusion.

Mediastinum/Nodes: There are no enlarged mediastinal, hilar or
axillary lymph nodes. The thyroid gland, trachea and esophagus
demonstrate no significant findings.

Lungs/Pleura: There is no pleural effusion. The lungs are clear.

Musculoskeletal/Chest wall: There is no chest wall mass or
suspicious osseous finding. Bilateral subpectoral breast implants.
Several old rib fractures on the right, incompletely healed.

Review of the MIP images confirms the above findings.

CT ABDOMEN and PELVIS FINDINGS

Hepatobiliary: Changes of hepatic cirrhosis are again noted. There
is progressive capsular retraction, volume loss and ill-defined
increased density anteriorly in the right hepatic lobe. Ill-defined
hyperdense 18 mm lesion in the left lobe on image [DATE] is stable. No
evidence of gallstones, gallbladder wall thickening or biliary
dilatation.

Pancreas: Unremarkable. No pancreatic ductal dilatation or
surrounding inflammatory changes.

Spleen: Spleen size is stable at the upper limits of normal. No
focal abnormality.

Adrenals/Urinary Tract: Both adrenal glands appear normal. Both
kidneys appear normal without evidence of urinary tract calculus,
renal mass hydronephrosis. The bladder appears normal.

Stomach/Bowel: No enteric contrast was administered. The stomach and
right colon are incompletely distended. Allowing for this, no
apparent bowel wall thickening or surrounding inflammation. The
appendix appears normal.

Vascular/Lymphatic: Several prominent lymph nodes in the porta
hepatis and gastrohepatic ligament are similar to the previous
study, likely reactive and related to the patient's cirrhosis. There
is no retroperitoneal lymphadenopathy. There is mild aortic and
branch vessel atherosclerosis. Small gastric and distal esophageal
varices. Recanalized left paraumbilical vein. Stable mild dilatation
of the left ovarian vein.

Reproductive: The uterus and ovaries appear normal. No evidence of
adnexal mass.

Other: Stable postsurgical changes in the periumbilical anterior
abdominal wall with skin thickening and increased density in the
underlying subcutaneous fat. There is no focal fluid collection or
hernia. There is a small amount of ascites around the liver and in
the pelvis, increased from the prior study.

Musculoskeletal: No acute osseous findings. Chronic sclerosis of the
left femoral head without collapse, consistent with mild chronic
avascular necrosis. Right iliopsoas bursal fluid collection appears
stable.
IMPRESSION: 1. No evidence of acute pulmonary embolism or other acute chest
findings.
2. Healing right-sided rib fractures.
3. Progressive changes of hepatic cirrhosis with probable confluent
hepatic fibrosis. Hypervascular lesion in the left lobe is stable.
4. Stable findings consistent with mild portal hypertension with
borderline splenomegaly, varices and increasing ascites.
5.  Aortic Atherosclerosis (513WU-PDP.P).

## 2020-04-22 IMAGING — US US ABDOMEN LIMITED
1 series · 5 of 5 positions shown · non-contrast
Comparison: CT abdomen pelvis 06/12/2017

CLINICAL DATA: Ascites

EXAM:
LIMITED ABDOMEN ULTRASOUND FOR ASCITES
TECHNIQUE: Limited ultrasound survey for ascites was performed in all four
abdominal quadrants.

[Series 1: us abdomen limited · 0.26mm/px · 5 of 5 slices shown]
[im 1/5]
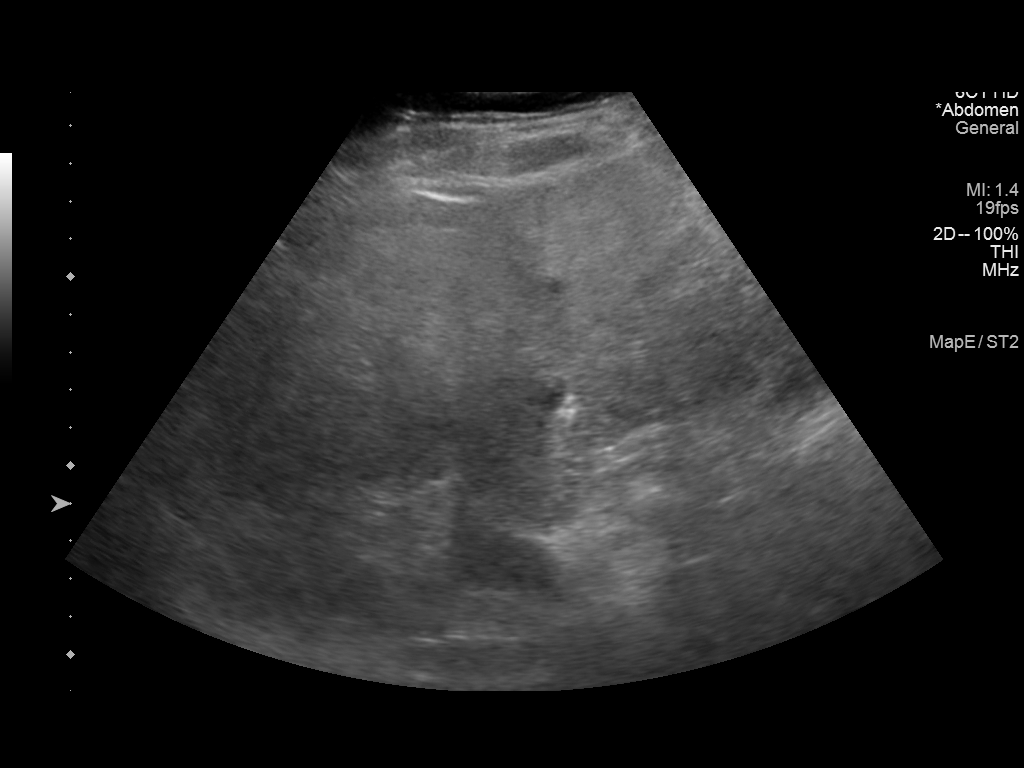
[im 2/5]
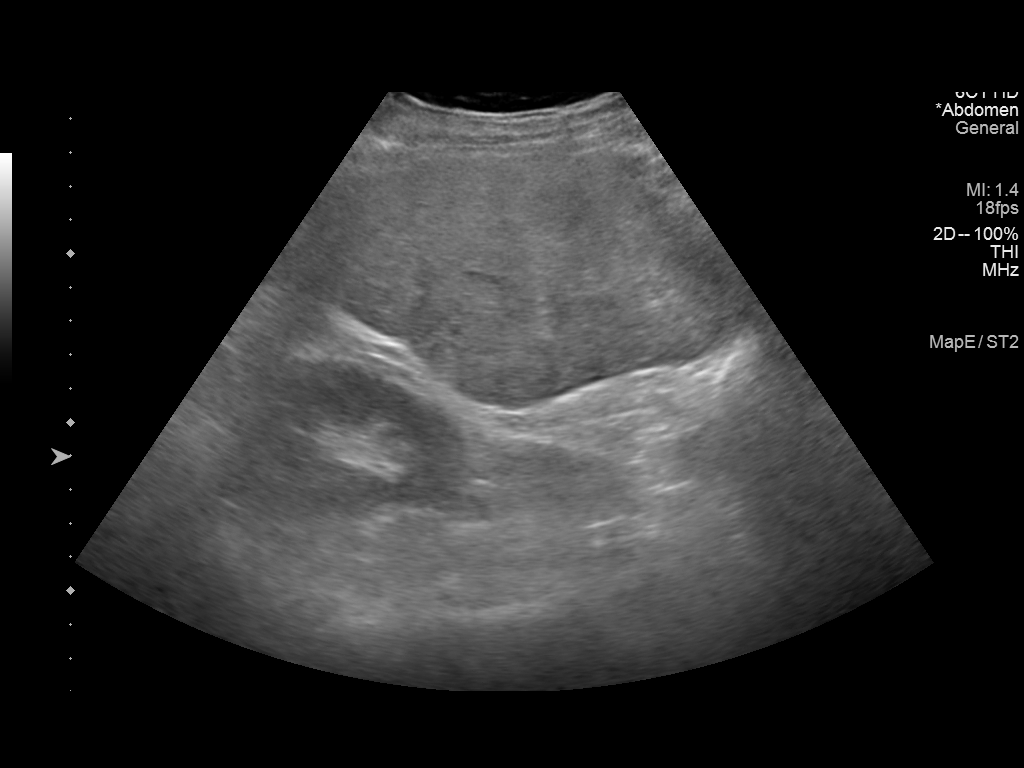
[im 3/5]
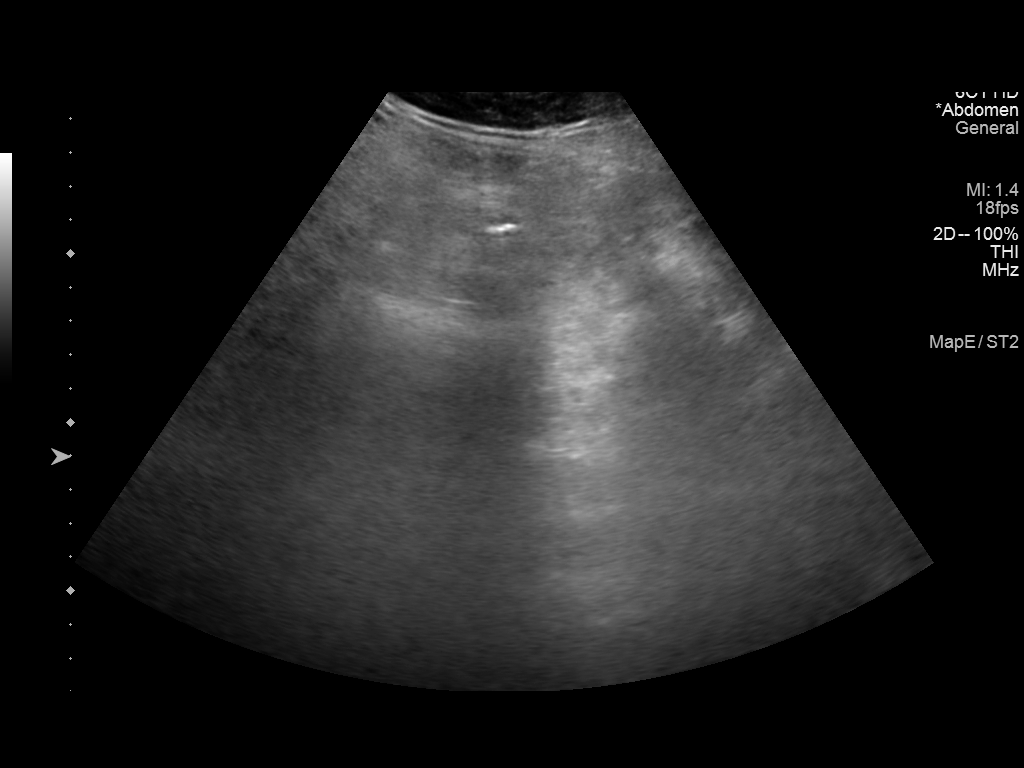
[im 4/5]
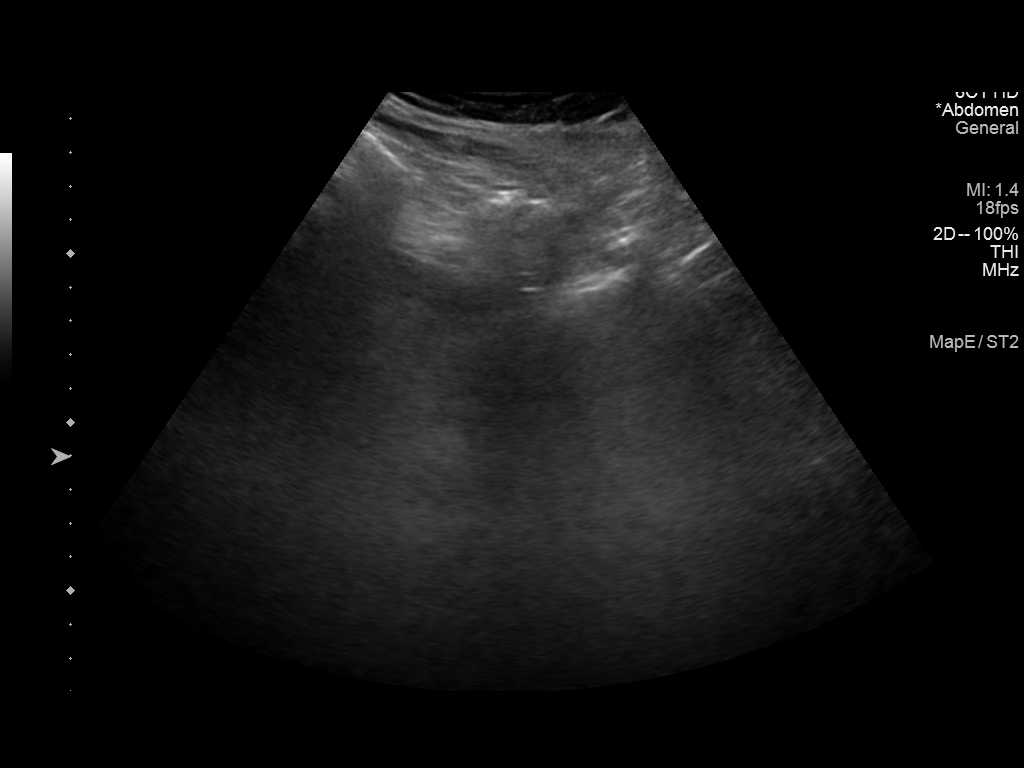
[im 5/5]
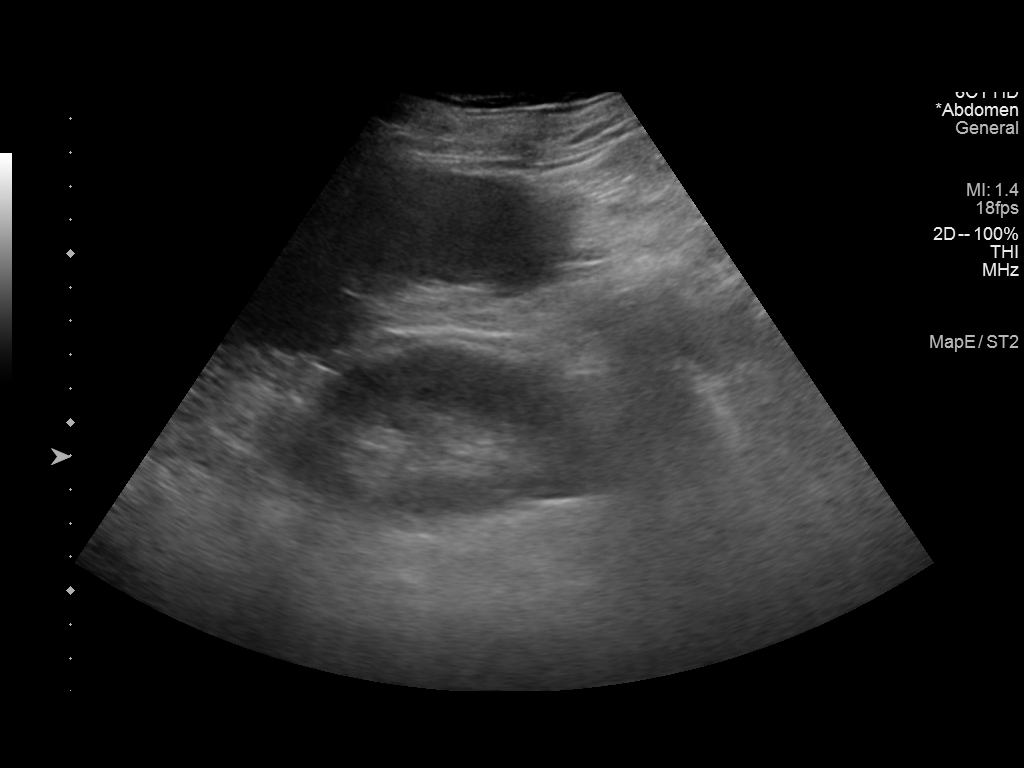

[5 of 5 positions shown; findings below may reference images not displayed]

FINDINGS: No significant ascites identified.

Perihepatic ascites seen on the recent CT exam not visualized.
IMPRESSION: No significant ascites identified.

## 2020-06-30 IMAGING — DX DG LUMBAR SPINE COMPLETE 4+V
5 series · 5 of 5 positions shown · non-contrast
Comparison: CT, 06/12/2017

CLINICAL DATA: from home with complaints of back pain, recent
falls, nausea, HA, chest tightness. Pt was discharged [REDACTED] after
spending 2 weeks in the hospital due to COPD

EXAM:
LUMBAR SPINE - COMPLETE 4+ VIEW

[l-spine ap]
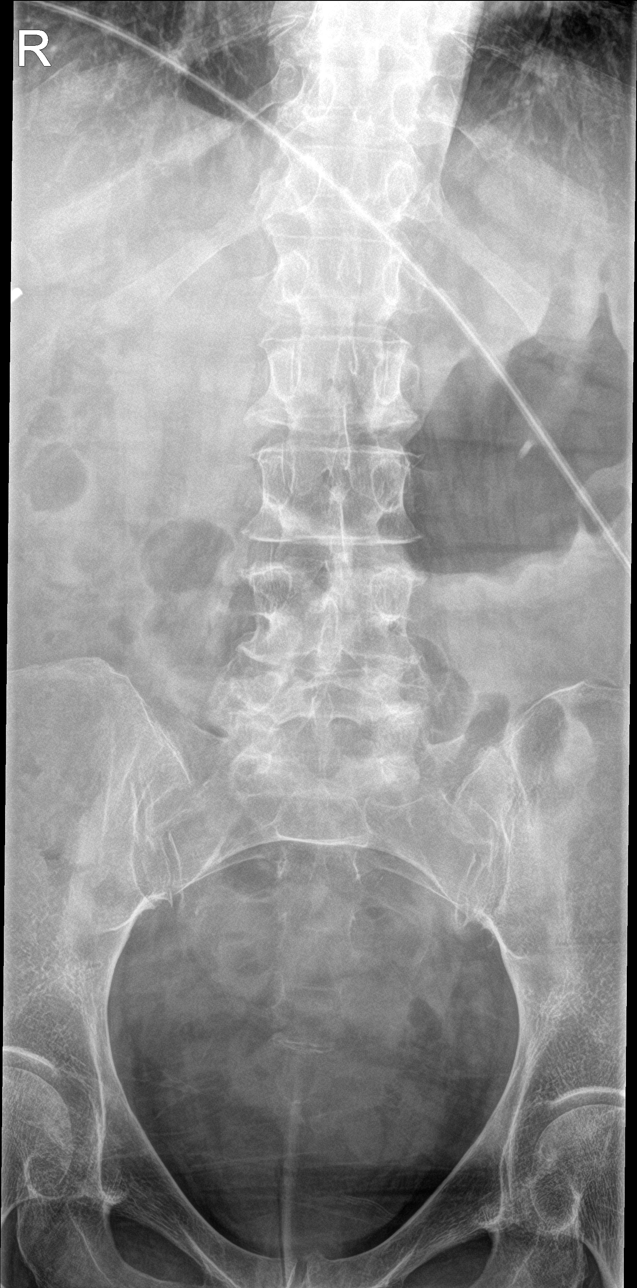

[l-spine obl (1 of 2)]
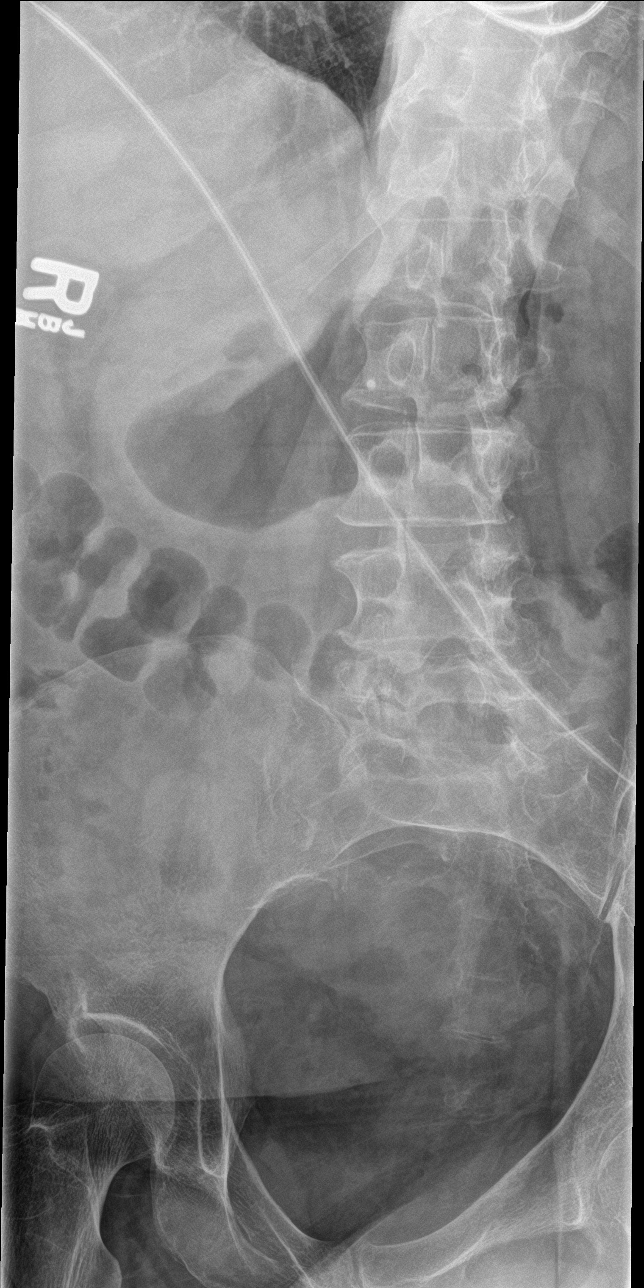

[l-spine obl (2 of 2)]
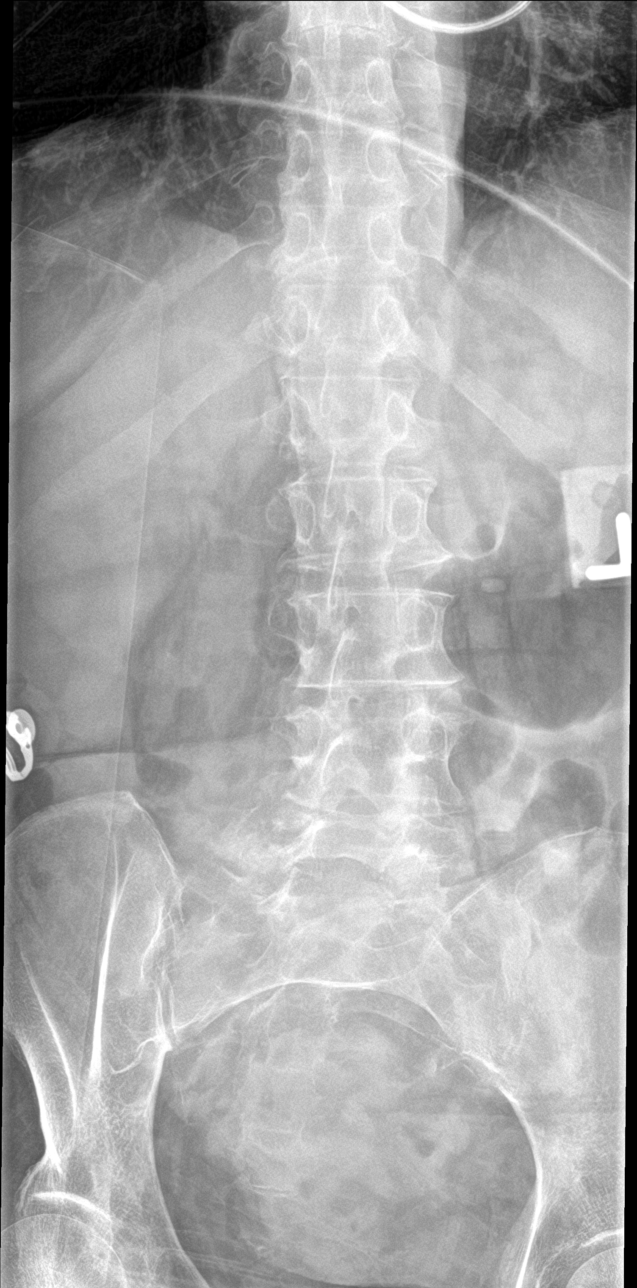

[l-spine lat]
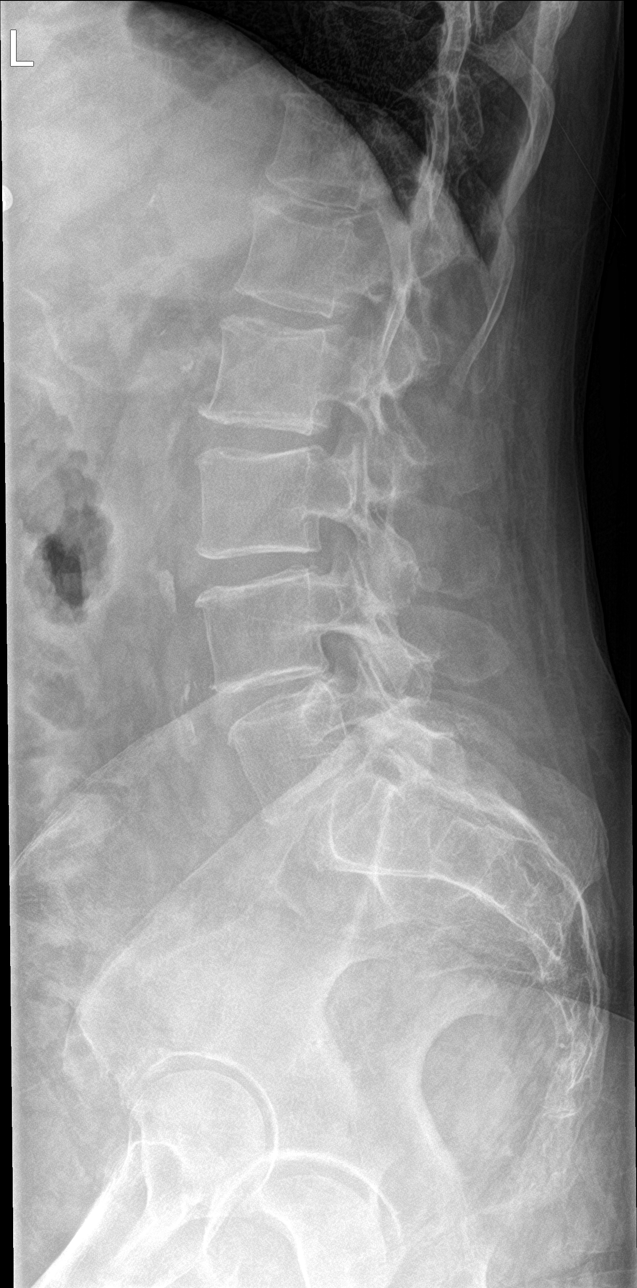

[l-spine spot]
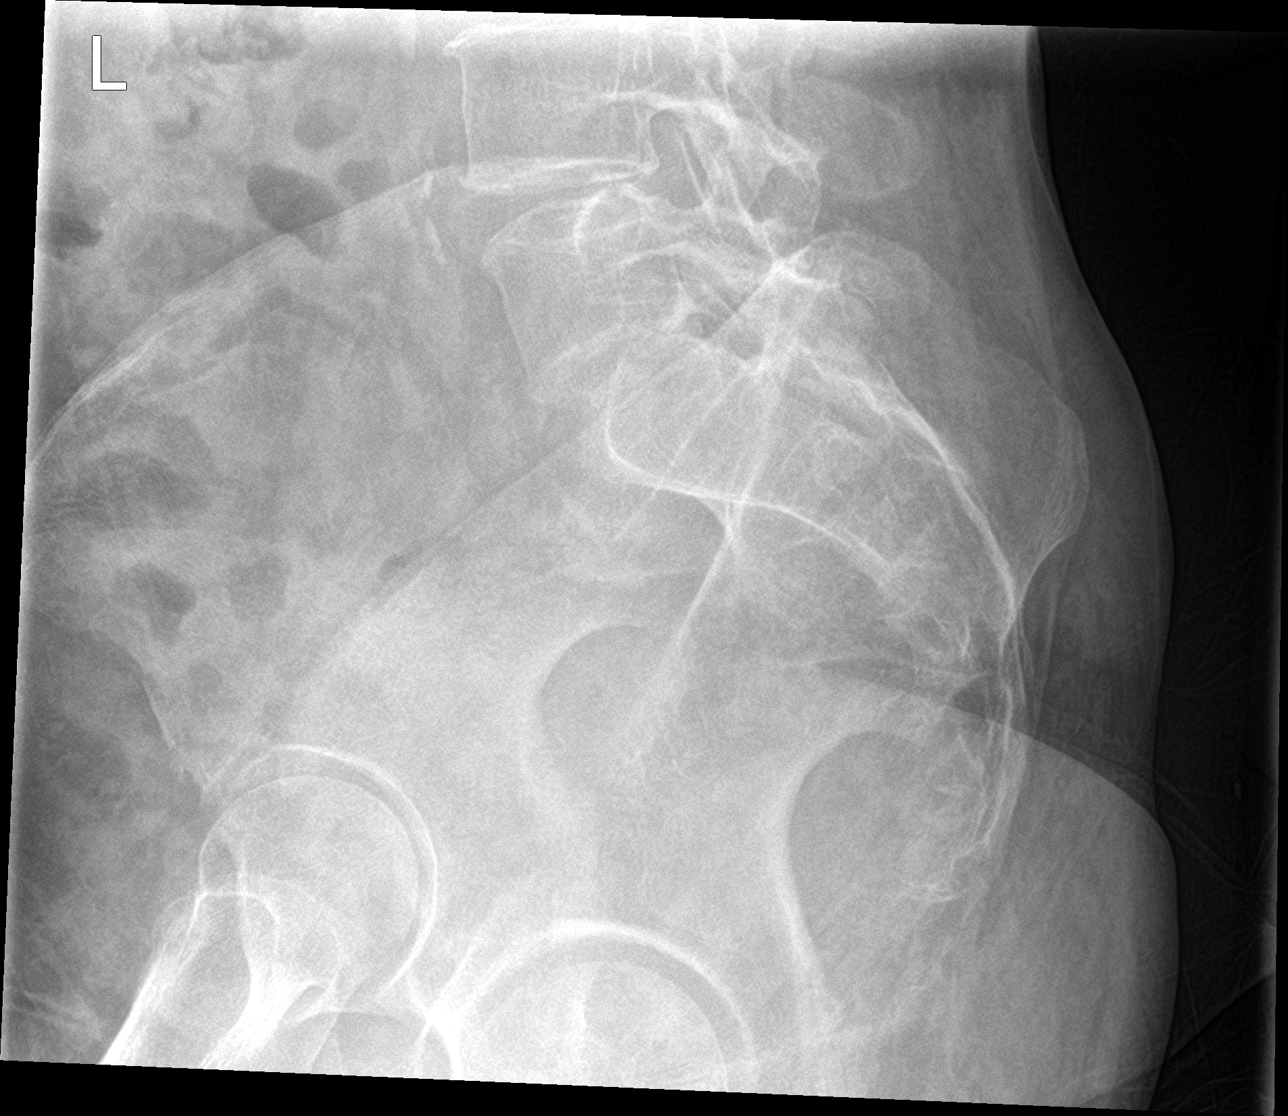

[5 of 5 positions shown; findings below may reference images not displayed]

FINDINGS: No fracture. No spondylolisthesis. Mild loss of disc height at
L4-L5. Minor endplate osteophytes from L2 through L5. Mild facet
degenerative change in the lower lumbar spine.

Scattered aortic atherosclerotic calcifications. Soft tissues are
otherwise unremarkable.
IMPRESSION: 1. No fracture or acute finding.  Mild degenerative changes.

## 2020-06-30 IMAGING — DX DG CHEST 2V
2 series · 2 of 2 positions shown · non-contrast
Comparison: 06/12/2017

CLINICAL DATA: from home with complaints of back pain, recent
falls, nausea, HA, chest tightness. Pt was discharged [REDACTED] after
spending 2 weeks in the hospital due to COPD

EXAM:
CHEST - 2 VIEW

[chest lat]
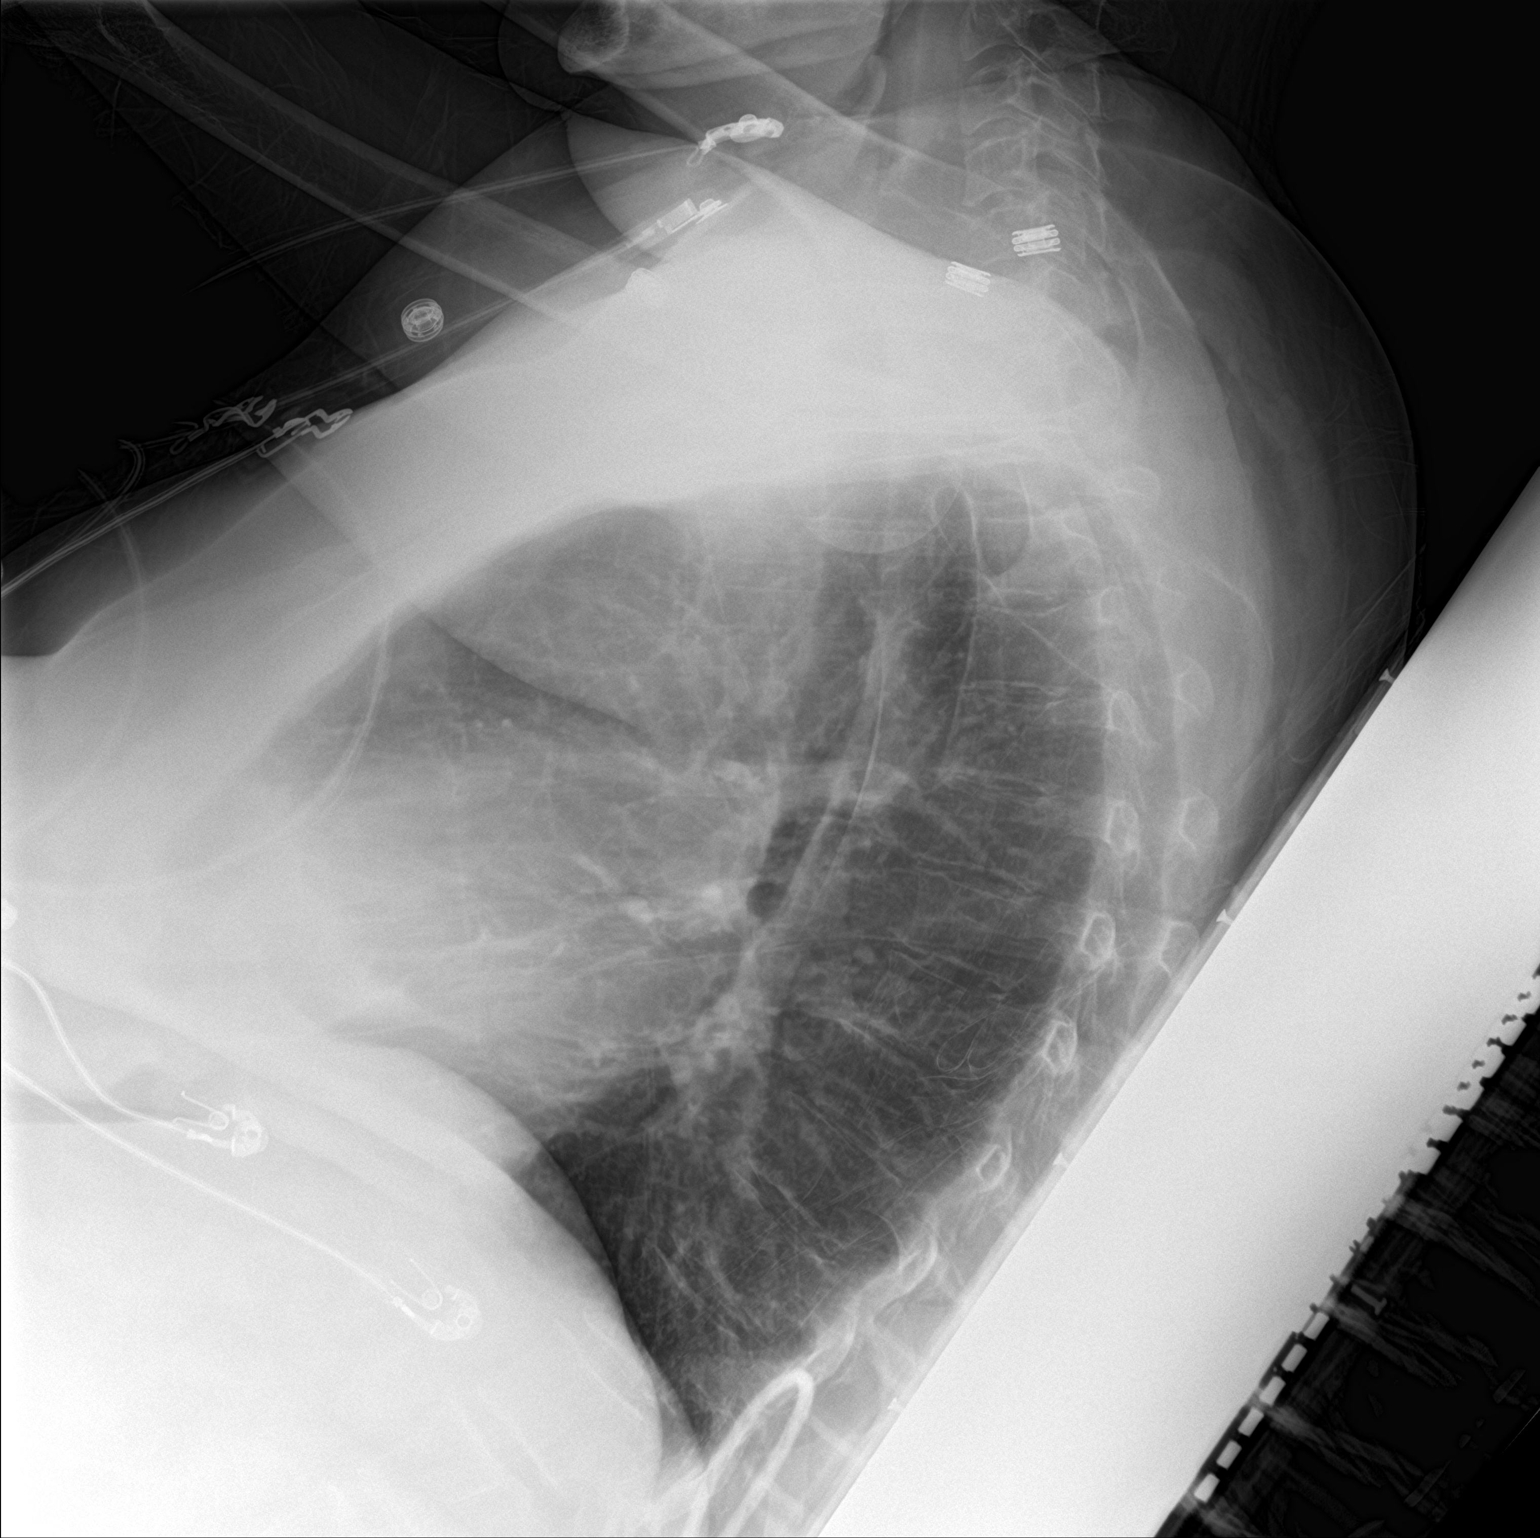

[chest ap]
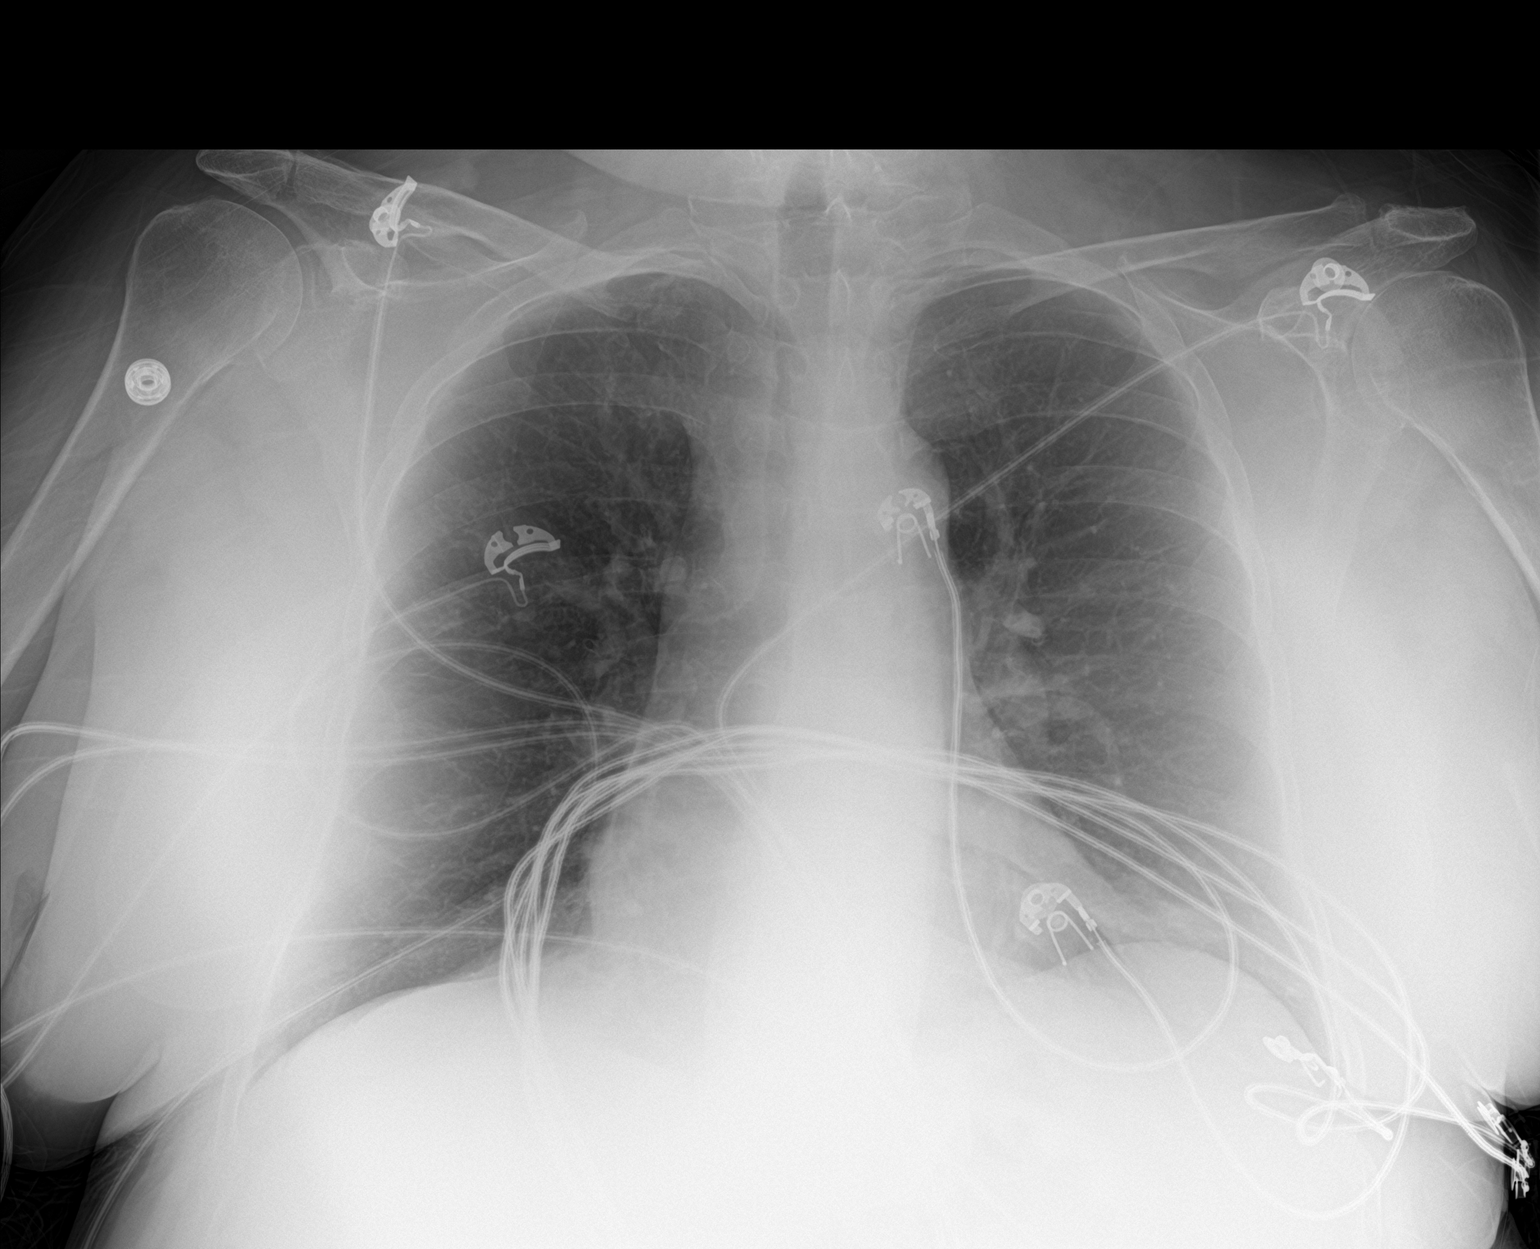

[2 of 2 positions shown; findings below may reference images not displayed]

FINDINGS: Cardiac silhouette is normal in size. No mediastinal or hilar
masses. No evidence of adenopathy.

Clear lungs.

No pleural effusion or pneumothorax.

Skeletal structures are intact.
IMPRESSION: No active cardiopulmonary disease.

## 2020-07-07 IMAGING — CT CT ANGIO CHEST
2 of 6 series · 18 of 46 positions shown · IV contrast (ISOVUE)
Comparison: 06/12/2017 and 01/18/2017

CLINICAL DATA: Back pain.

EXAM:
CT ANGIOGRAPHY CHEST WITH CONTRAST
TECHNIQUE: Multidetector CT imaging of the chest was performed using the
standard protocol during bolus administration of intravenous
contrast. Multiplanar CT image reconstructions and MIPs were
obtained to evaluate the vascular anatomy.
CONTRAST:  100mL [id number]-[id number] IOPAMIDOL (SPT3MF-VZY) INJECTION 76%

[Series 6: thins · axial · 0.62mm/px · z∈[+1289,+1569]mm · 15 of 308 slices shown]
[im 14/308  lung]
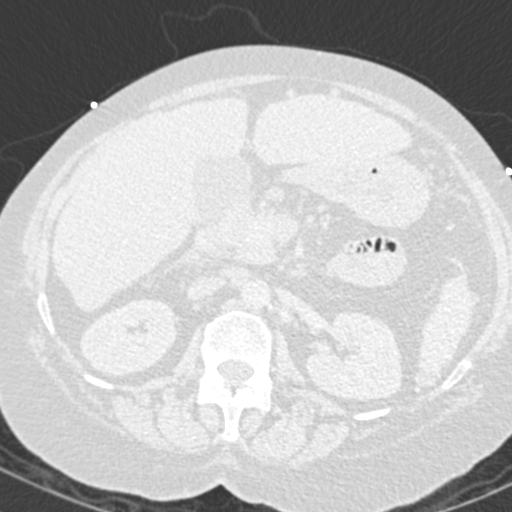
[im 41/308  soft-tissue]
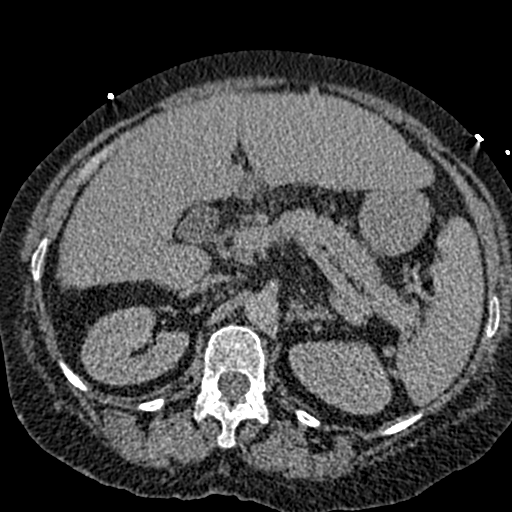
[im 54/308  lung]
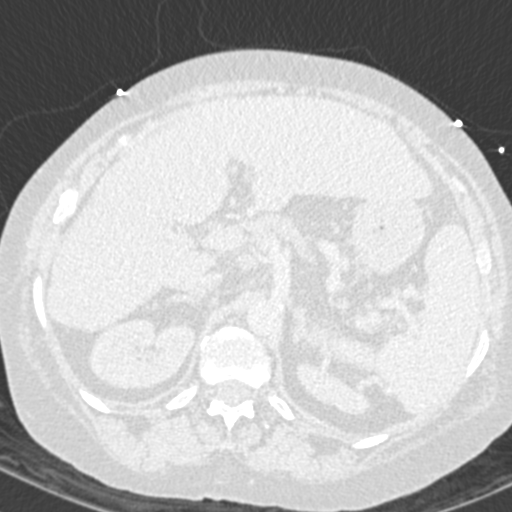
[im 81/308  soft-tissue]
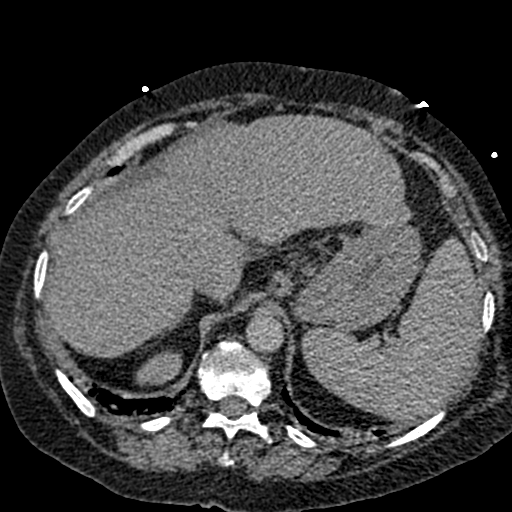
[im 94/308  lung]
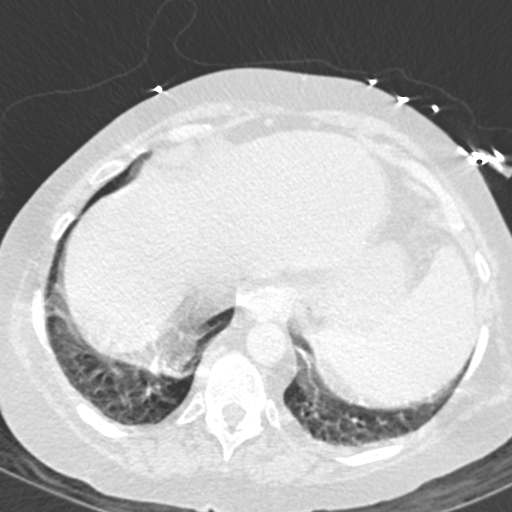
[im 121/308  soft-tissue]
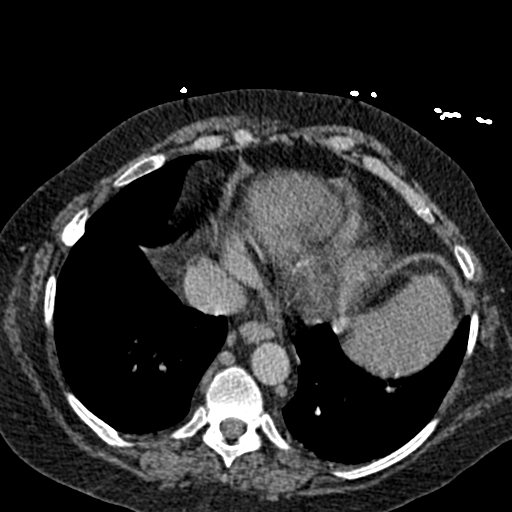
[im 134/308  lung]
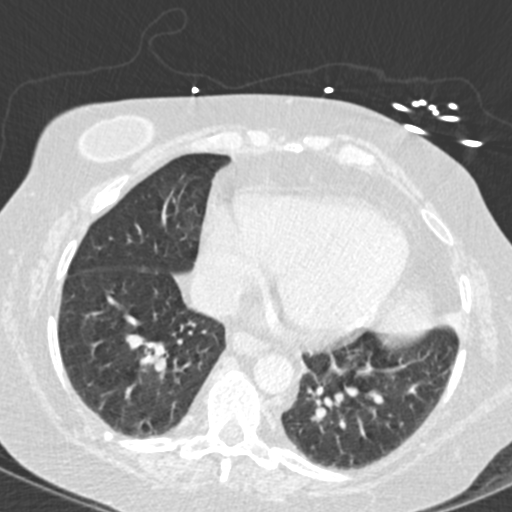
[im 161/308  soft-tissue]
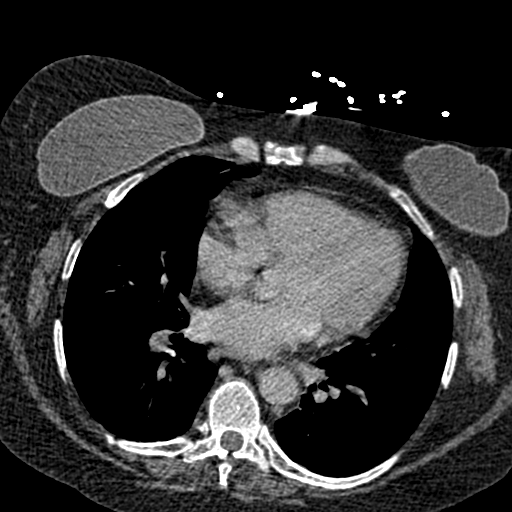
[im 174/308  lung]
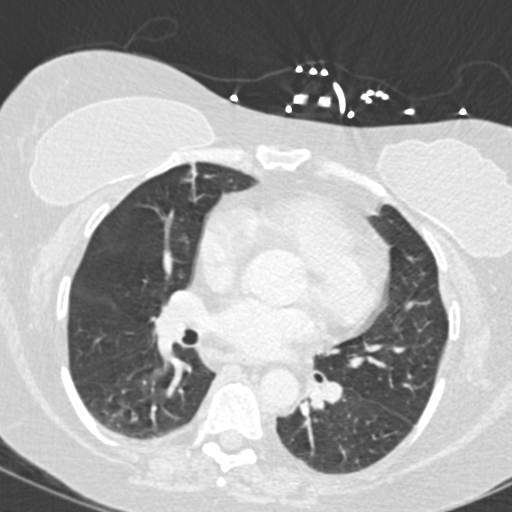
[im 187/308  soft-tissue]
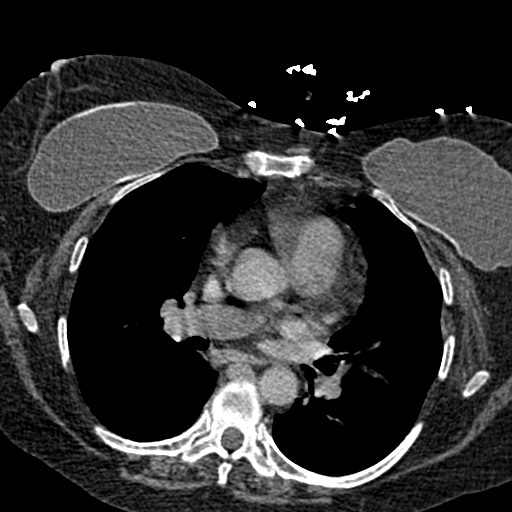
[im 214/308  lung]
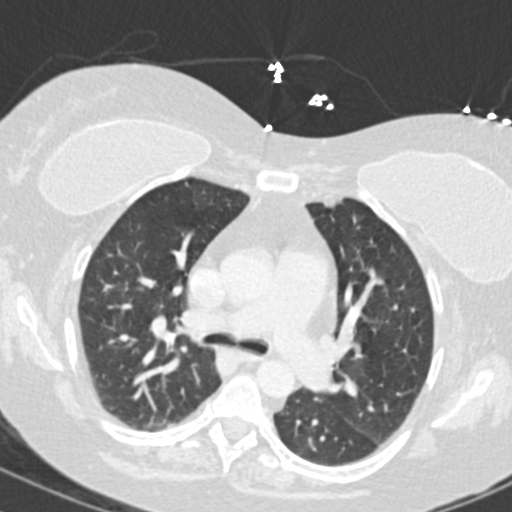
[im 227/308  soft-tissue]
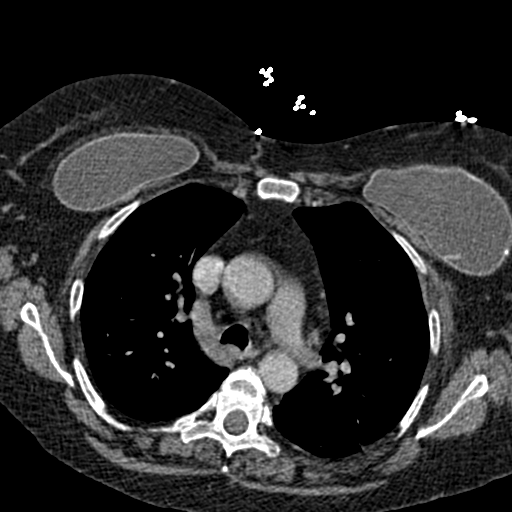
[im 254/308  lung]
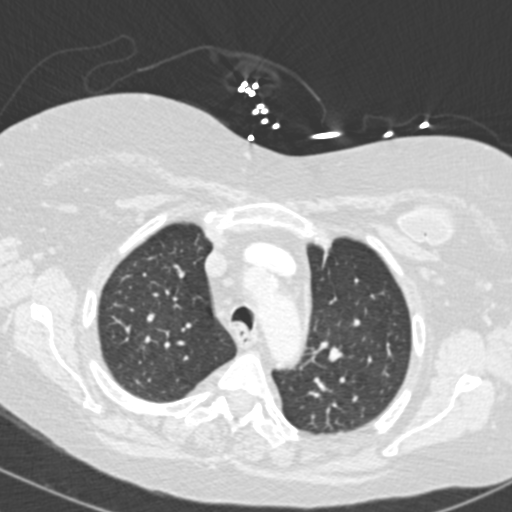
[im 267/308  soft-tissue]
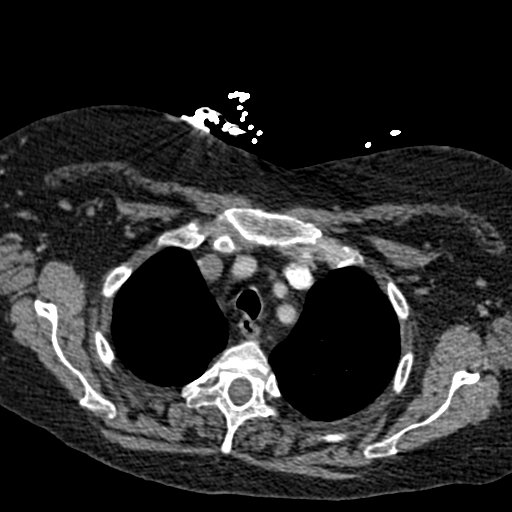
[im 294/308  lung]
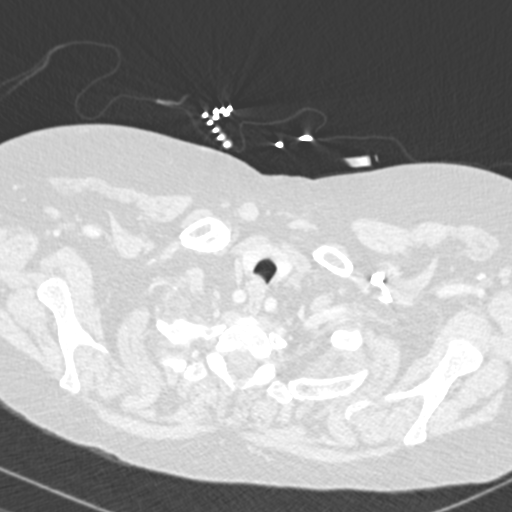

[Series 8: coronal mpr · coronal · 0.56mm/px · 3 of 151 slices shown]
[im 38/151  soft-tissue]
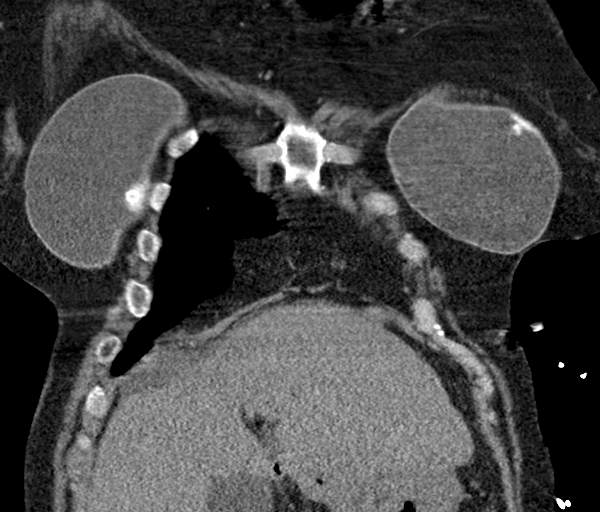
[im 76/151  soft-tissue]
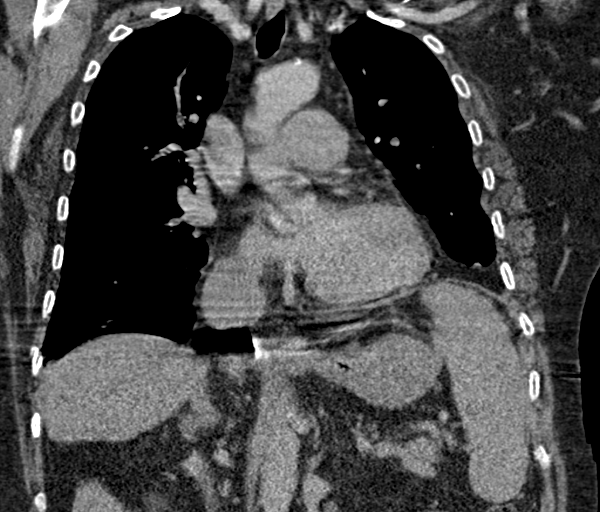
[im 113/151  soft-tissue]
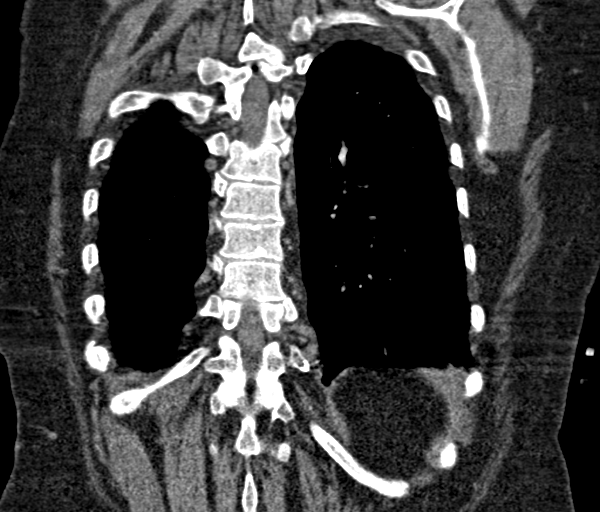

[18 of 46 positions shown; findings below may reference images not displayed]

FINDINGS: Note that there was a malfunction during contrast injection as only
a tiny amount of contrast was injected as tubing became disconnected
and most of contrast ended up on the floor external to the patient.
Patient declined additional contrast injection.

Cardiovascular: Borderline cardiomegaly. Thoracic aorta and takeoff
of great vessels is within normal. No significant contrast
opacification of the pulmonary arteries which are otherwise
unremarkable.

Mediastinum/Nodes: No mediastinal or hilar adenopathy. Remaining
mediastinal structures are unremarkable.

Lungs/Pleura: Lungs are well inflated without focal airspace
consolidation or effusion. Airways are normal.

Upper Abdomen: Subtle ill definition of the peripancreatic fat
planes adjacent the head the pancreas on the most inferior images
with suggestion of minimal adjacent free fluid. This is not fully
evaluated on this exam and could be due to acute pancreatitis.
Minimal calcified plaque over the abdominal aorta.

Musculoskeletal: Minimal degenerate change of the spine. Bilateral
breast implants.

Review of the MIP images confirms the above findings.
IMPRESSION: Exam is nondiagnostic for pulmonary embolism as described due to
malfunction during contrast administration. No acute cardiopulmonary
disease.

Mild ill definition of the peripancreatic fat planes adjacent the
head of the pancreas possible adjacent fluid on the most inferior
abdominal images as this is not fully evaluated and may be due to
acute pancreatitis. Recommend clinical correlation.

Mild cardiomegaly.

Aortic Atherosclerosis ([id number]-WYY.[id number]).

## 2020-11-22 IMAGING — DX DG CHEST 1V PORT
1 series · 1 of 1 positions shown · non-contrast
Comparison: 08/28/2017, chest CT

CLINICAL DATA: Headache, shortness of breath and productive cough.

EXAM:
PORTABLE CHEST 1 VIEW

[chest]
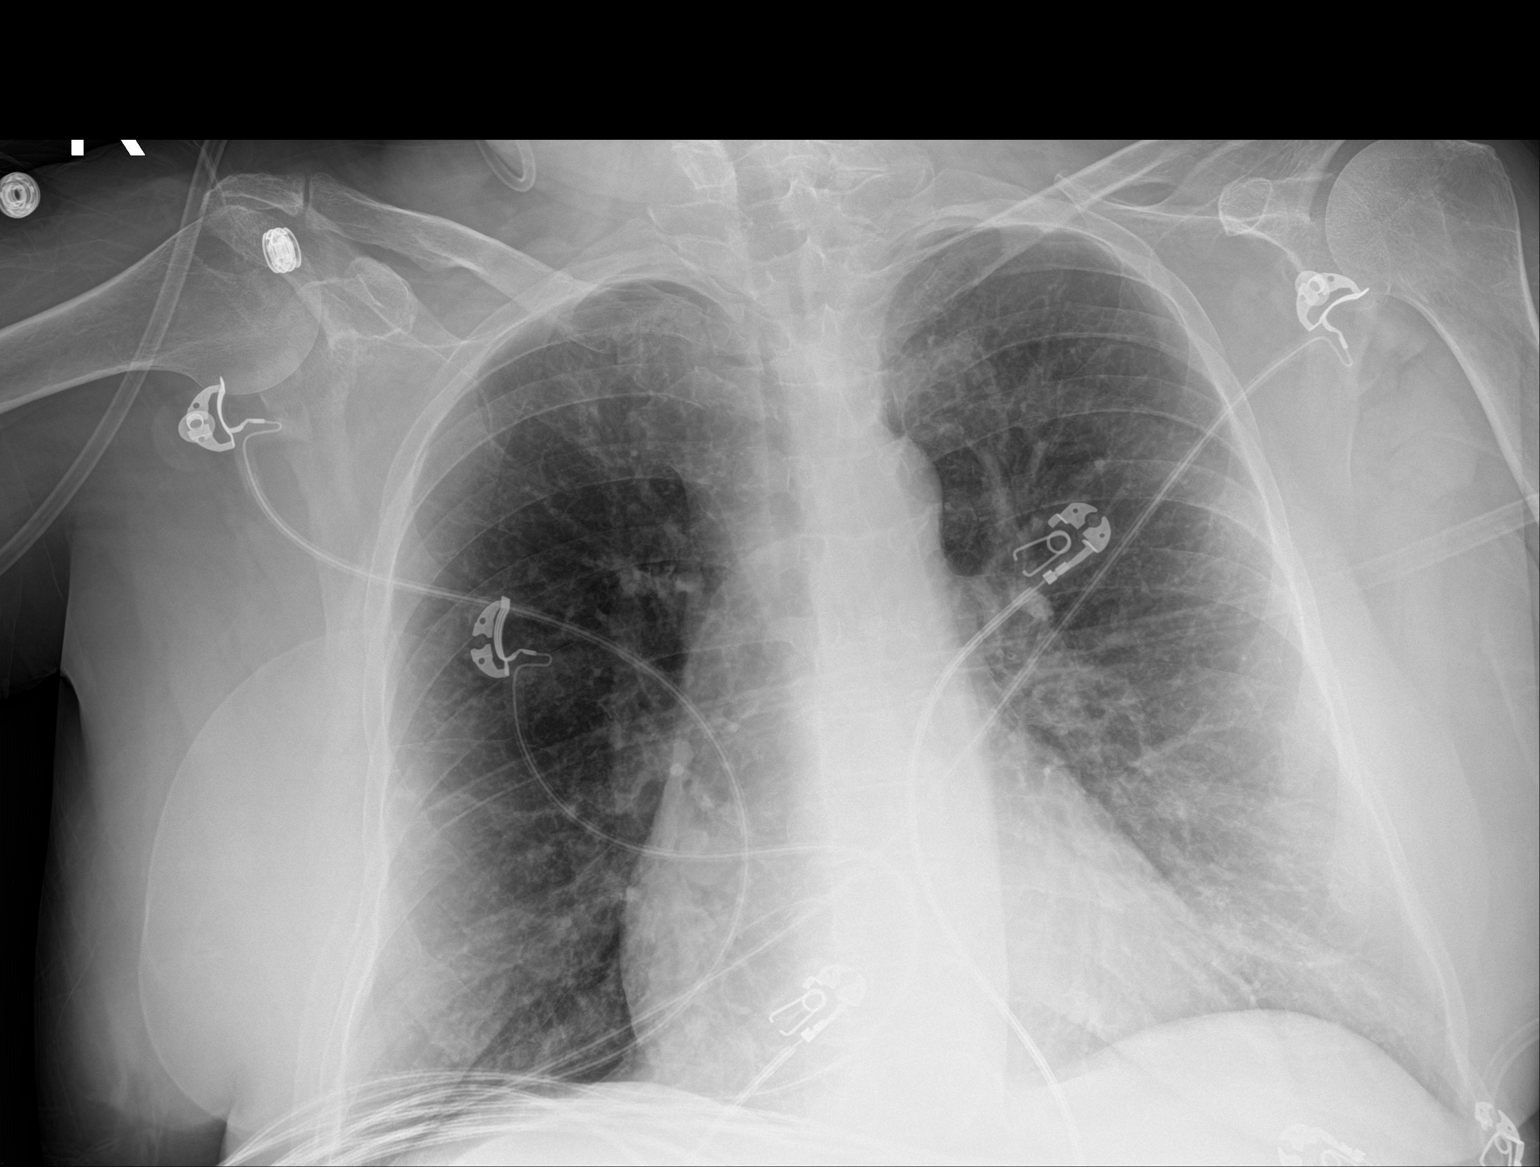

[1 of 1 positions shown; findings below may reference images not displayed]

FINDINGS: Cardiomediastinal silhouette is normal. Mediastinal contours appear
intact.

There is no pleural effusion or pneumothorax. Peribronchial airspace
consolidation in the left lower thorax.

Osseous structures are without acute abnormality. Soft tissues are
grossly normal. Breast implants noted.
IMPRESSION: Peribronchial airspace consolidation the left lower thorax may
represent atelectasis or early infectious consolidation.

## 2020-11-23 IMAGING — DX DG CHEST 1V PORT
1 series · 1 of 1 positions shown · non-contrast
Comparison: Chest x-ray from yesterday.

CLINICAL DATA: Pneumonia.

EXAM:
PORTABLE CHEST 1 VIEW

[chest ap]
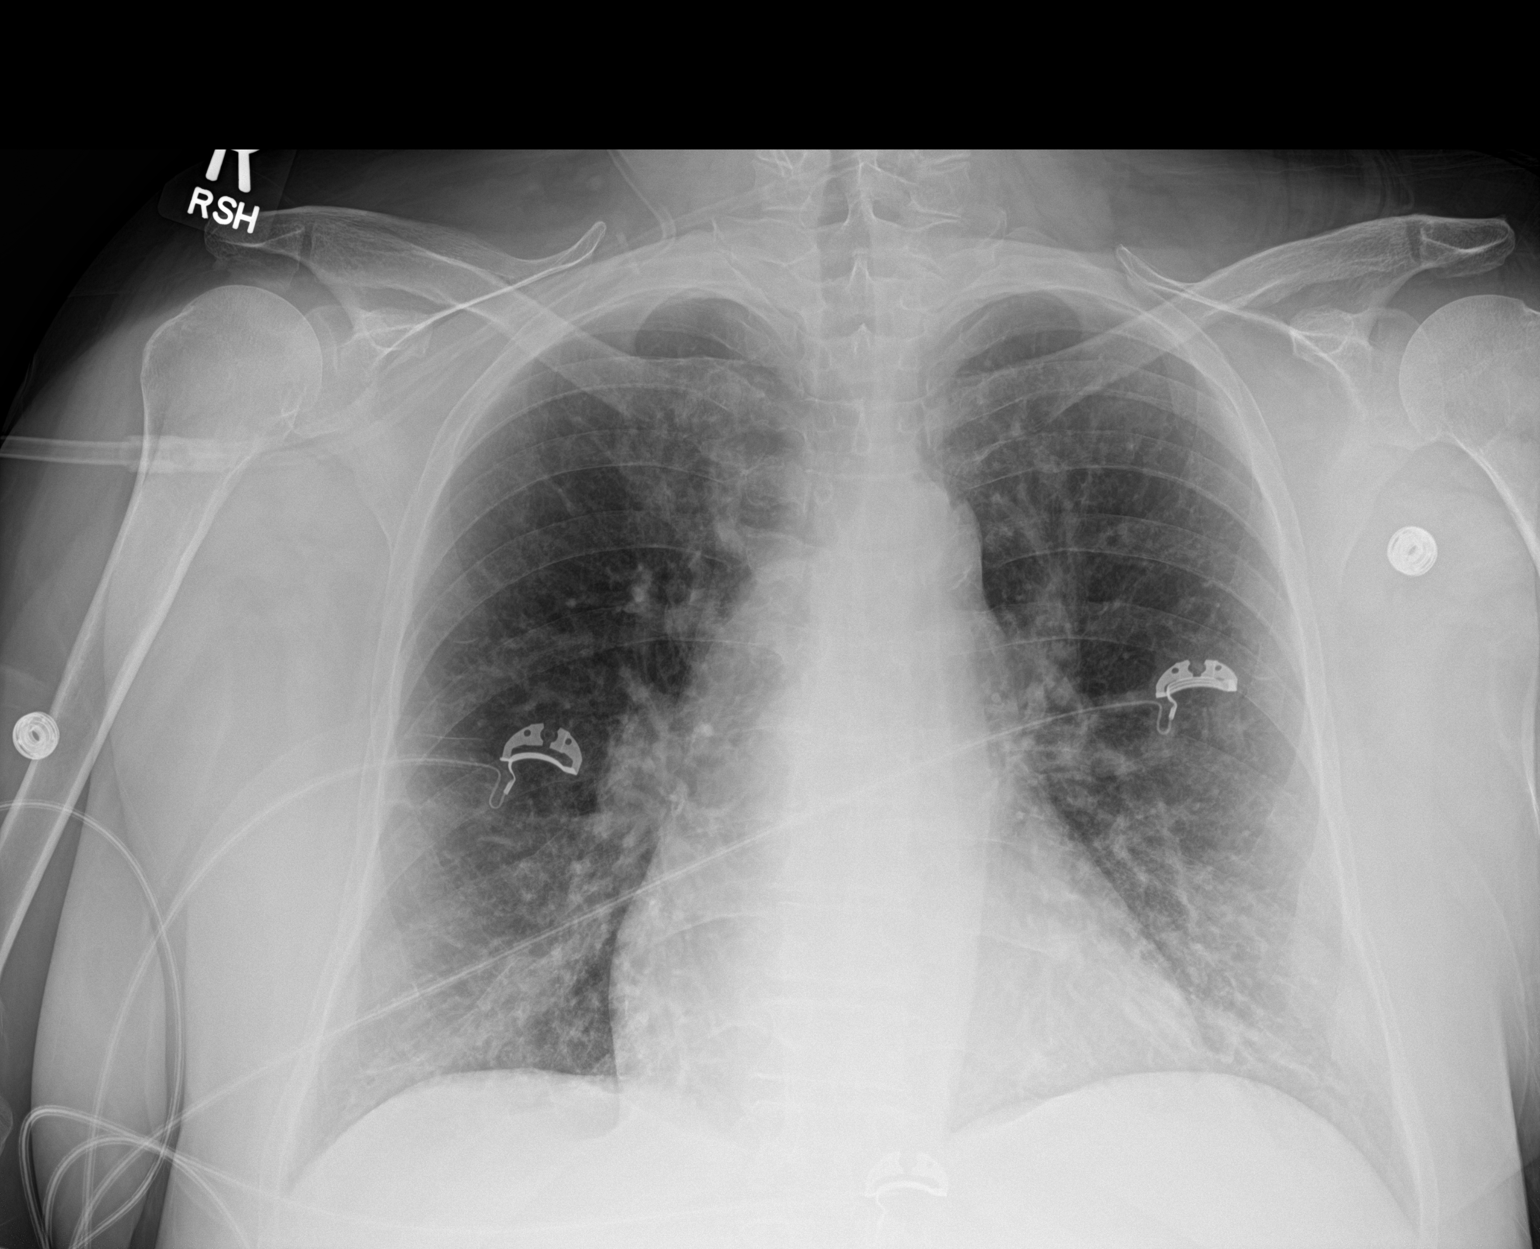

[1 of 1 positions shown; findings below may reference images not displayed]

FINDINGS: The heart size and mediastinal contours are within normal limits.
Normal pulmonary vascularity. Unchanged somewhat linear opacities at
the left lung base. No pleural effusion or pneumothorax. No acute
osseous abnormality.
IMPRESSION: Unchanged left lower lobe atelectasis versus infiltrate.

## 2020-11-29 IMAGING — CR DG CHEST 2V
2 series · 2 of 2 positions shown · non-contrast
Comparison: 01/14/2018

CLINICAL DATA: Cough, shortness of breath

EXAM:
CHEST - 2 VIEW

[chest lat]
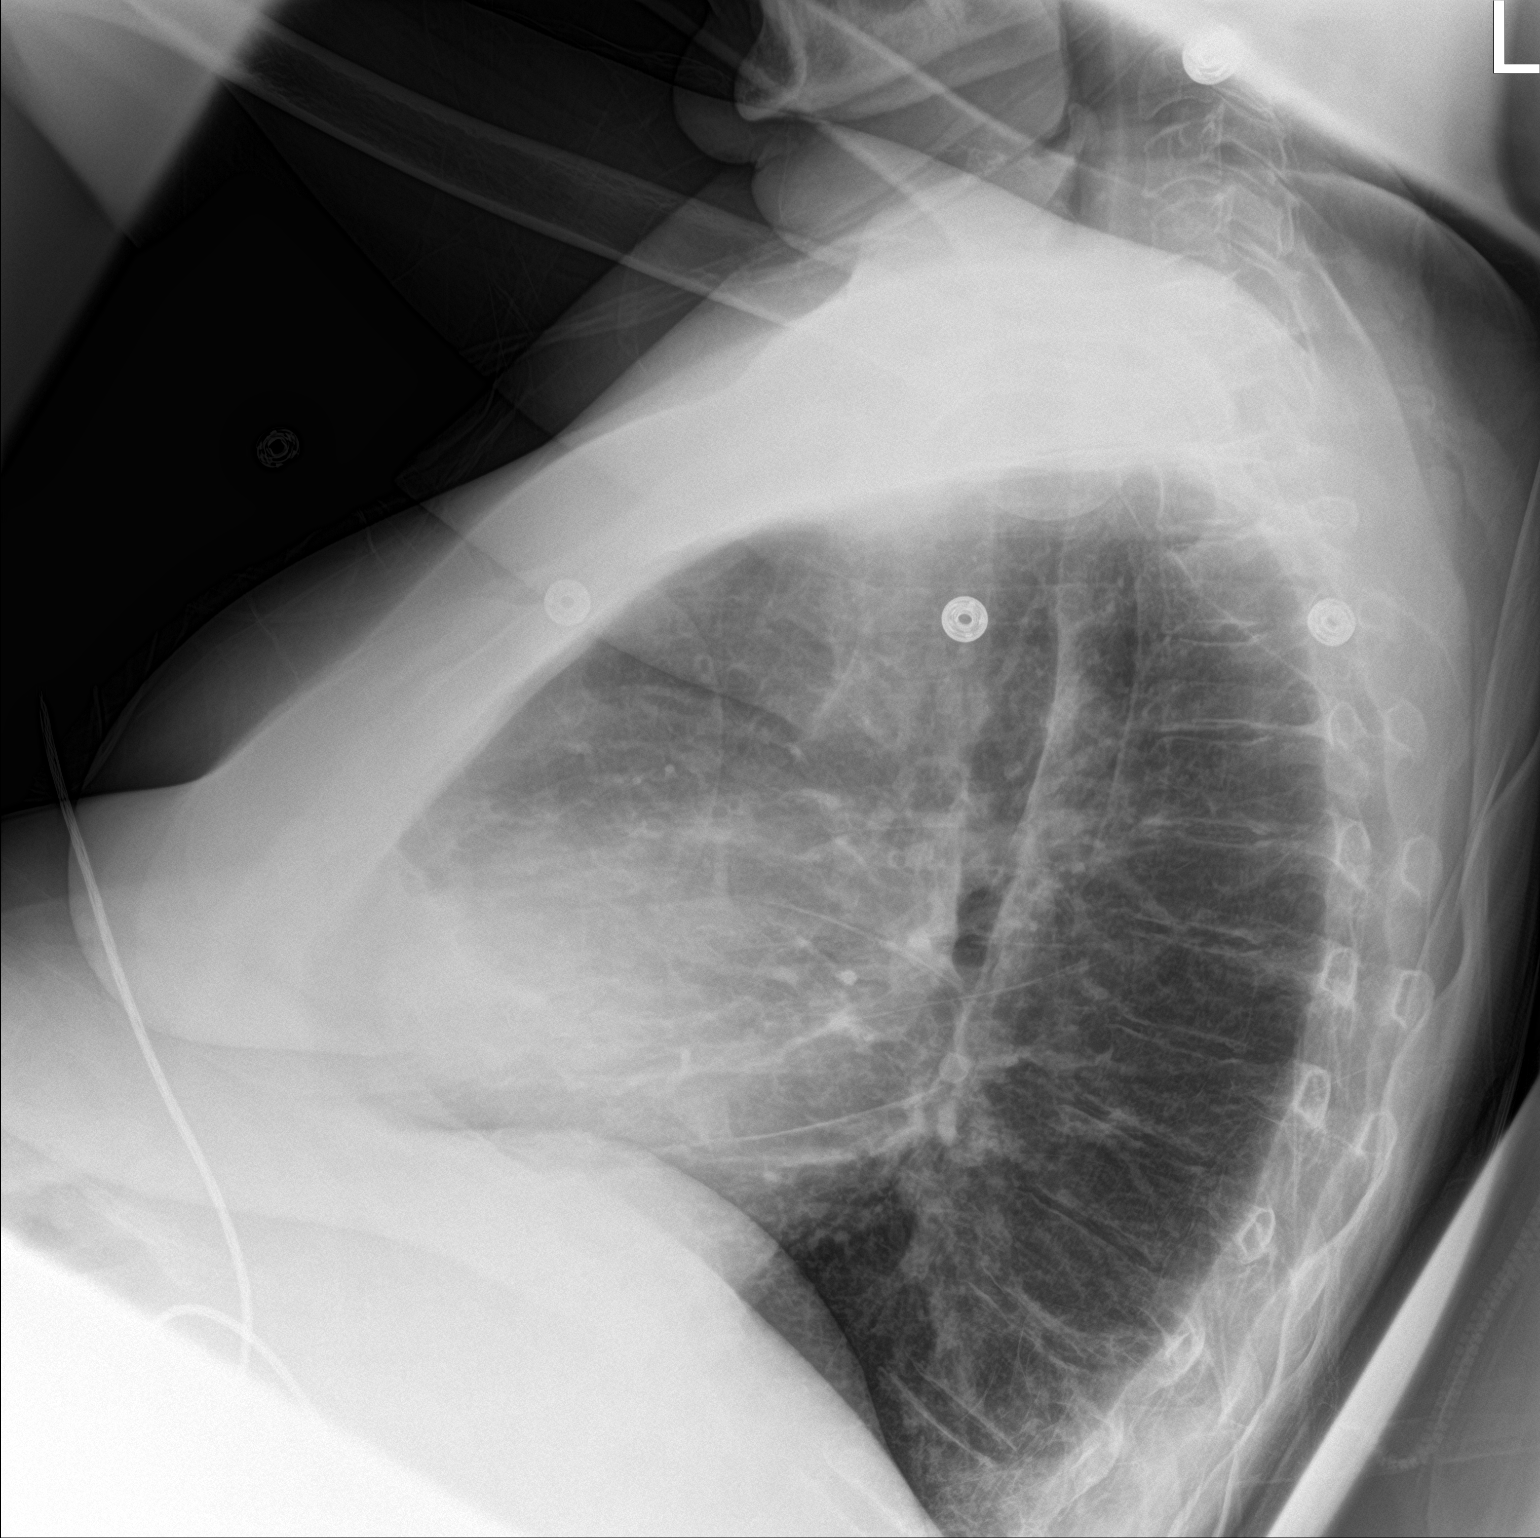

[chest ap]
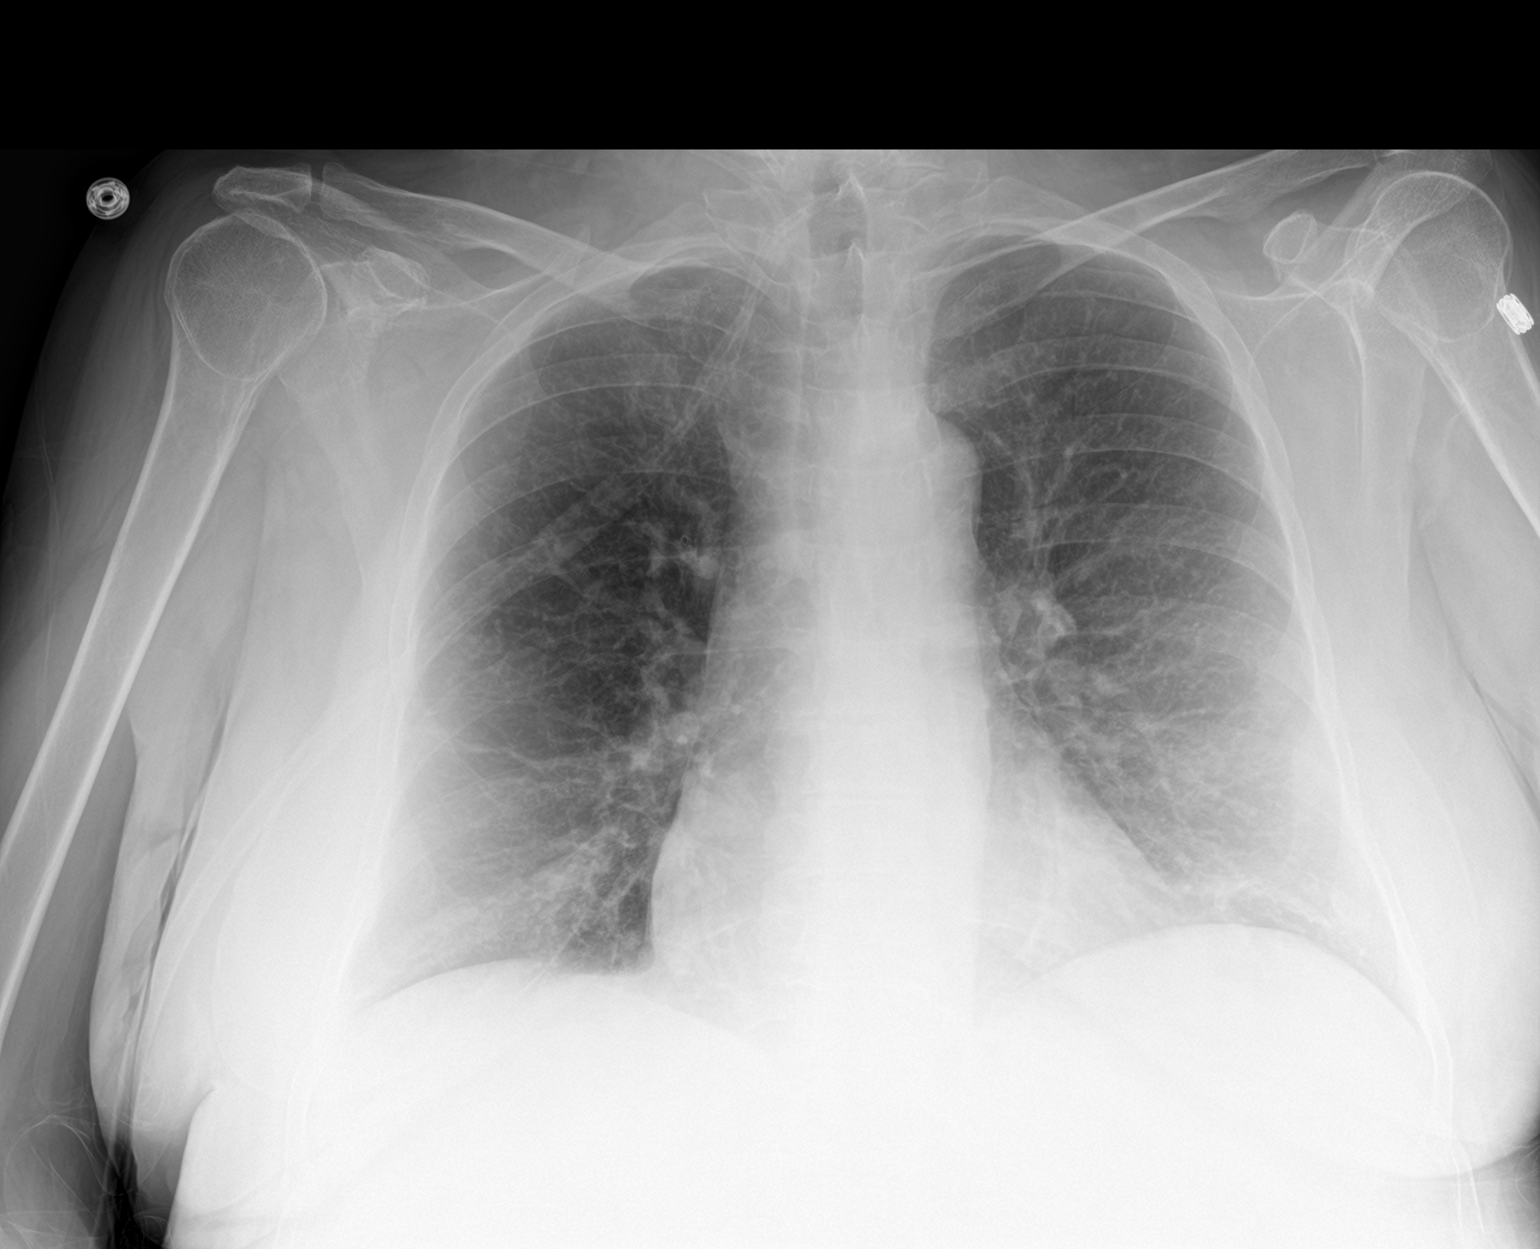

[2 of 2 positions shown; findings below may reference images not displayed]

FINDINGS: Heart and mediastinal contours are within normal limits. No focal
opacities or effusions. No acute bony abnormality.
IMPRESSION: No active cardiopulmonary disease.

## 2020-12-04 IMAGING — CR DG CHEST 2V
2 series · 2 of 2 positions shown · non-contrast
Comparison: Chest x-ray dated January 20, 2018.

CLINICAL DATA: Abdominal pain with nausea and vomiting. History of
cirrhosis.

EXAM:
CHEST - 2 VIEW

[w chest pa]
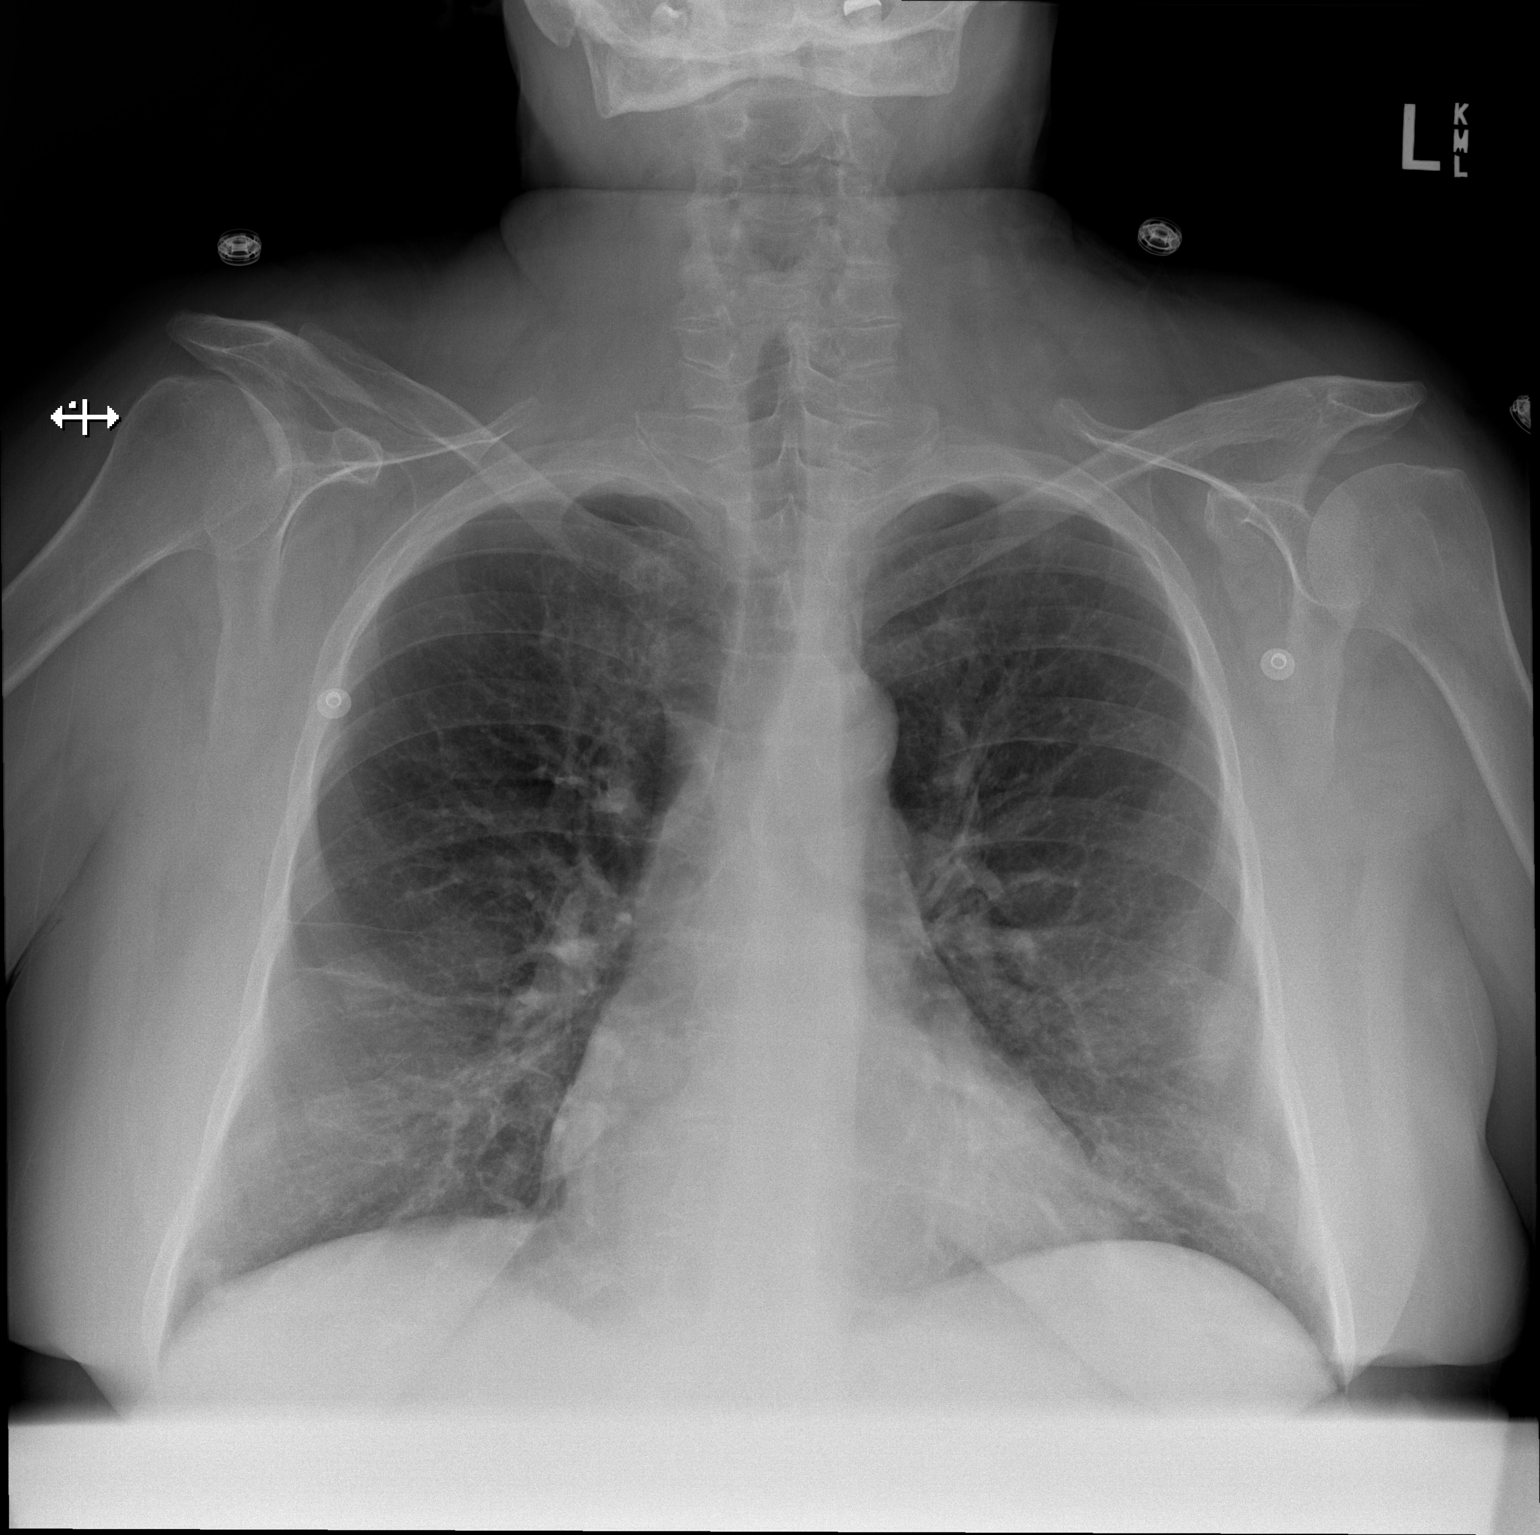

[w chest lat]
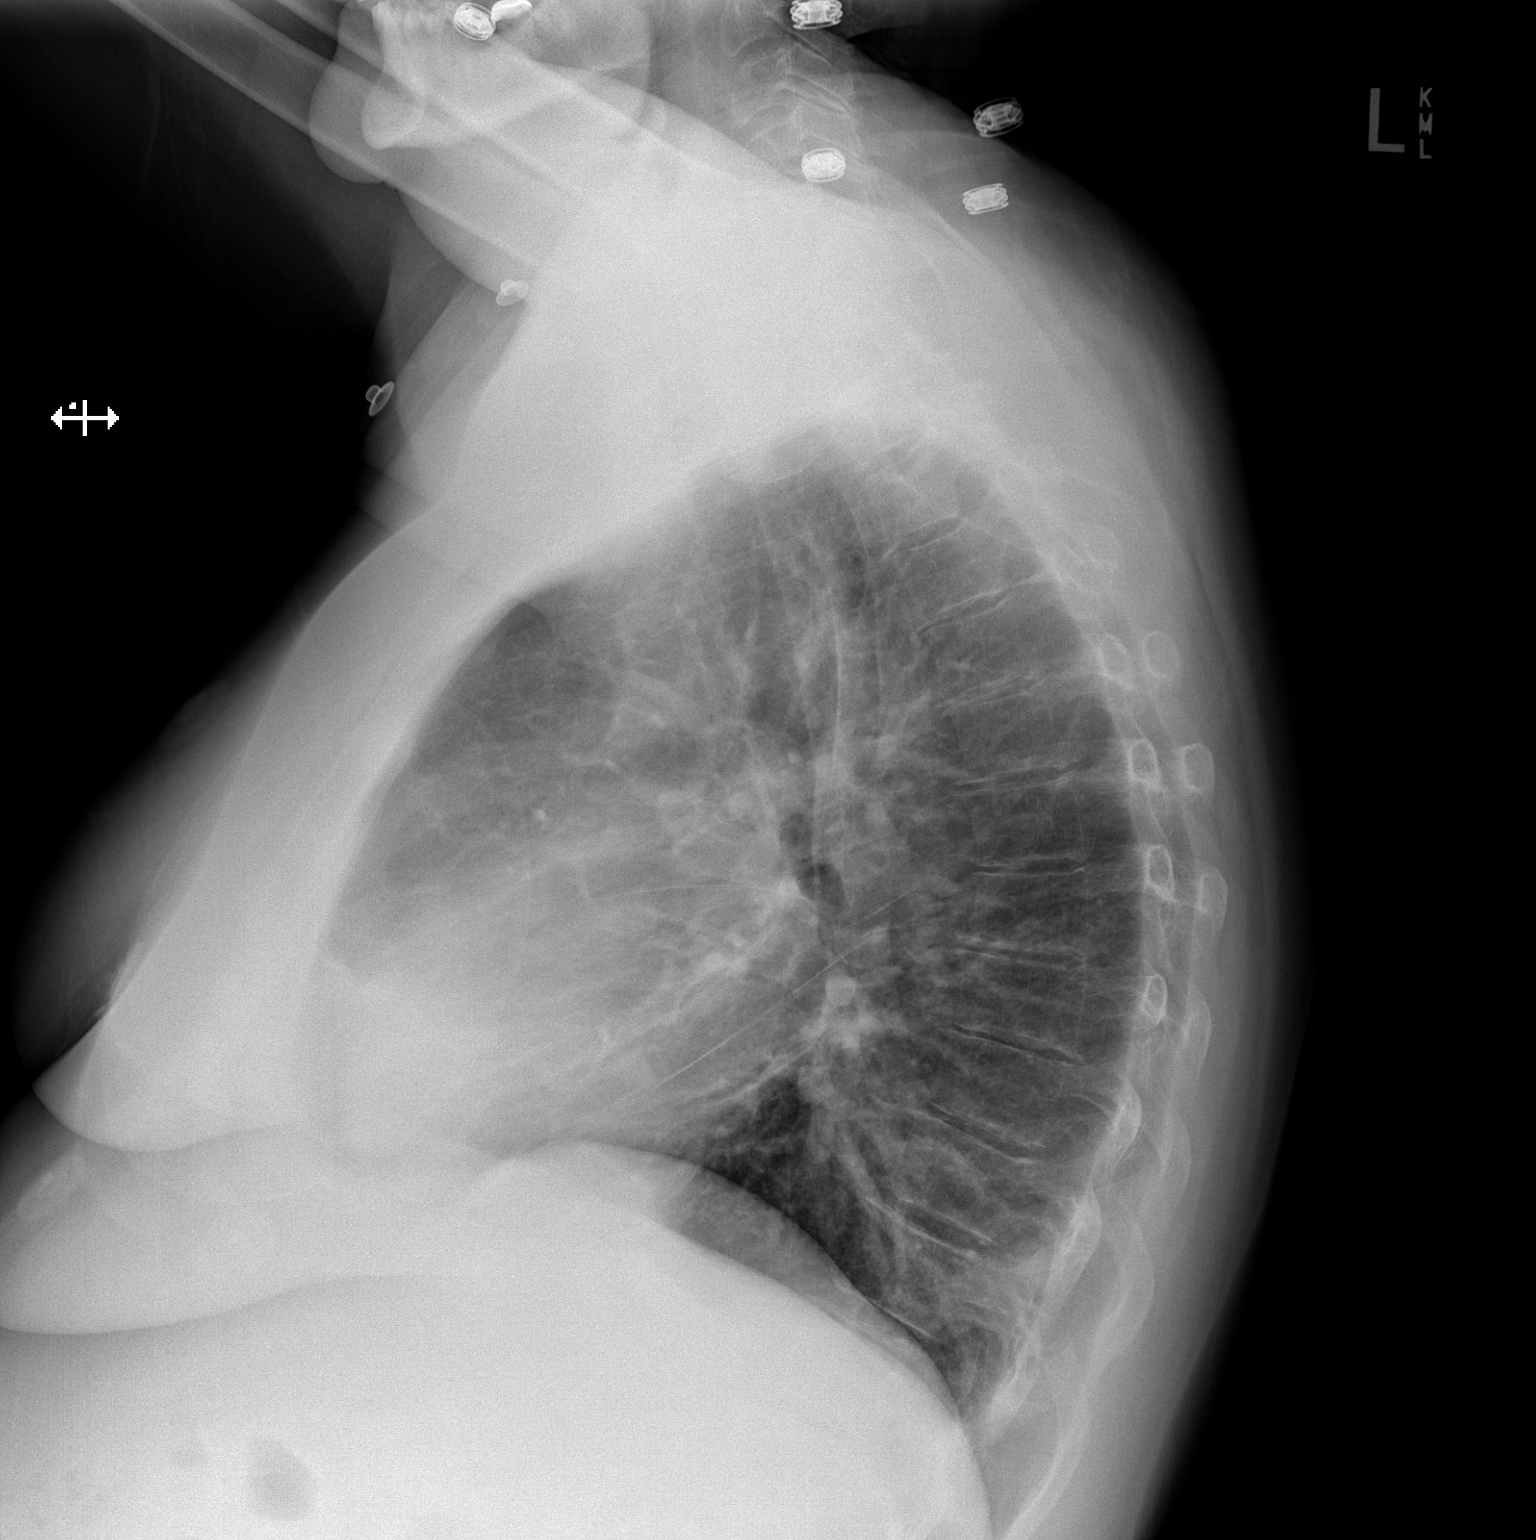

[2 of 2 positions shown; findings below may reference images not displayed]

FINDINGS: The heart size and mediastinal contours are within normal limits.
Normal pulmonary vascularity. Unchanged linear scarring/atelectasis
at the lung bases. No focal consolidation, pleural effusion, or
pneumothorax. No acute osseous abnormality.
IMPRESSION: No active cardiopulmonary disease.

## 2021-01-02 IMAGING — CR DG CHEST 2V
2 series · 2 of 2 positions shown · non-contrast
Comparison: January 25, 2018

CLINICAL DATA: ETOH addiction for detox

EXAM:
CHEST - 2 VIEW

[chest lat]
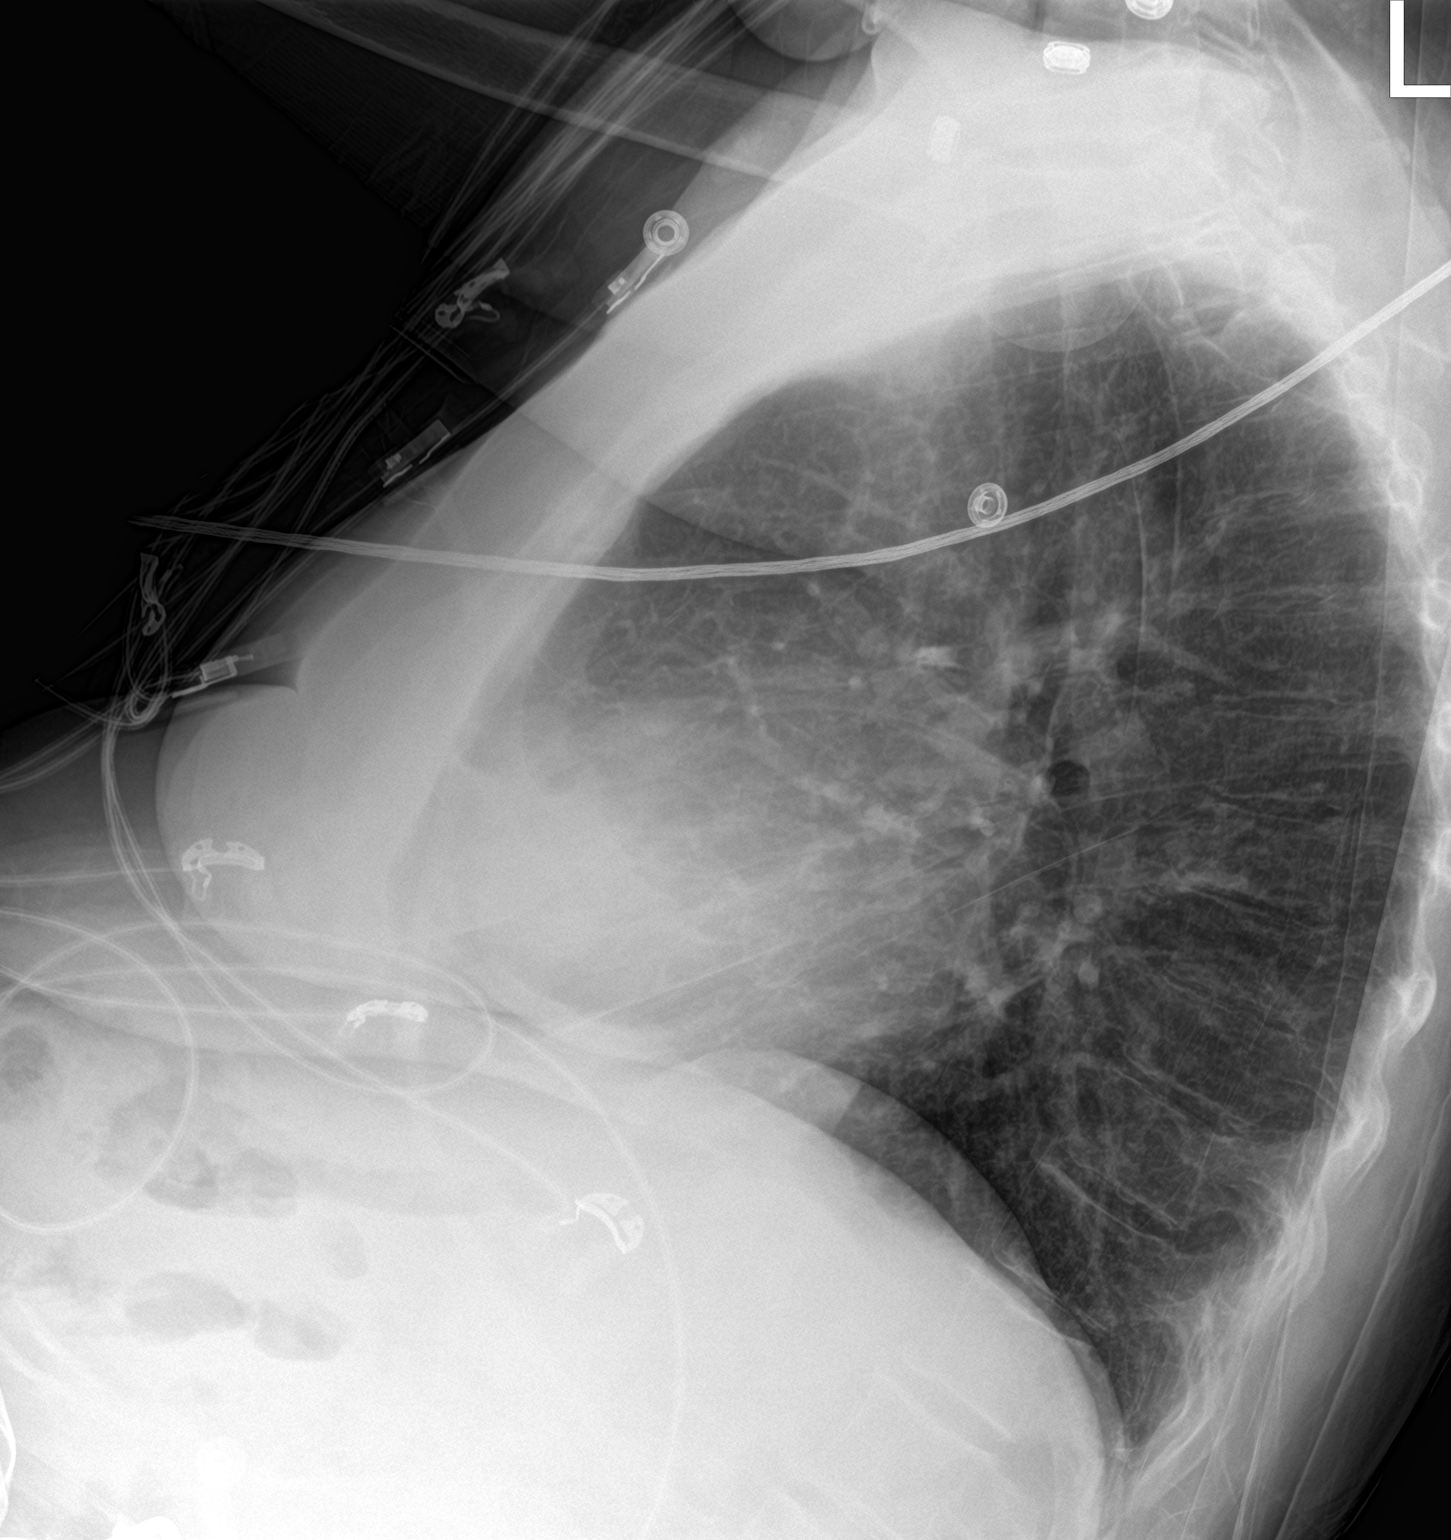

[chest ap]
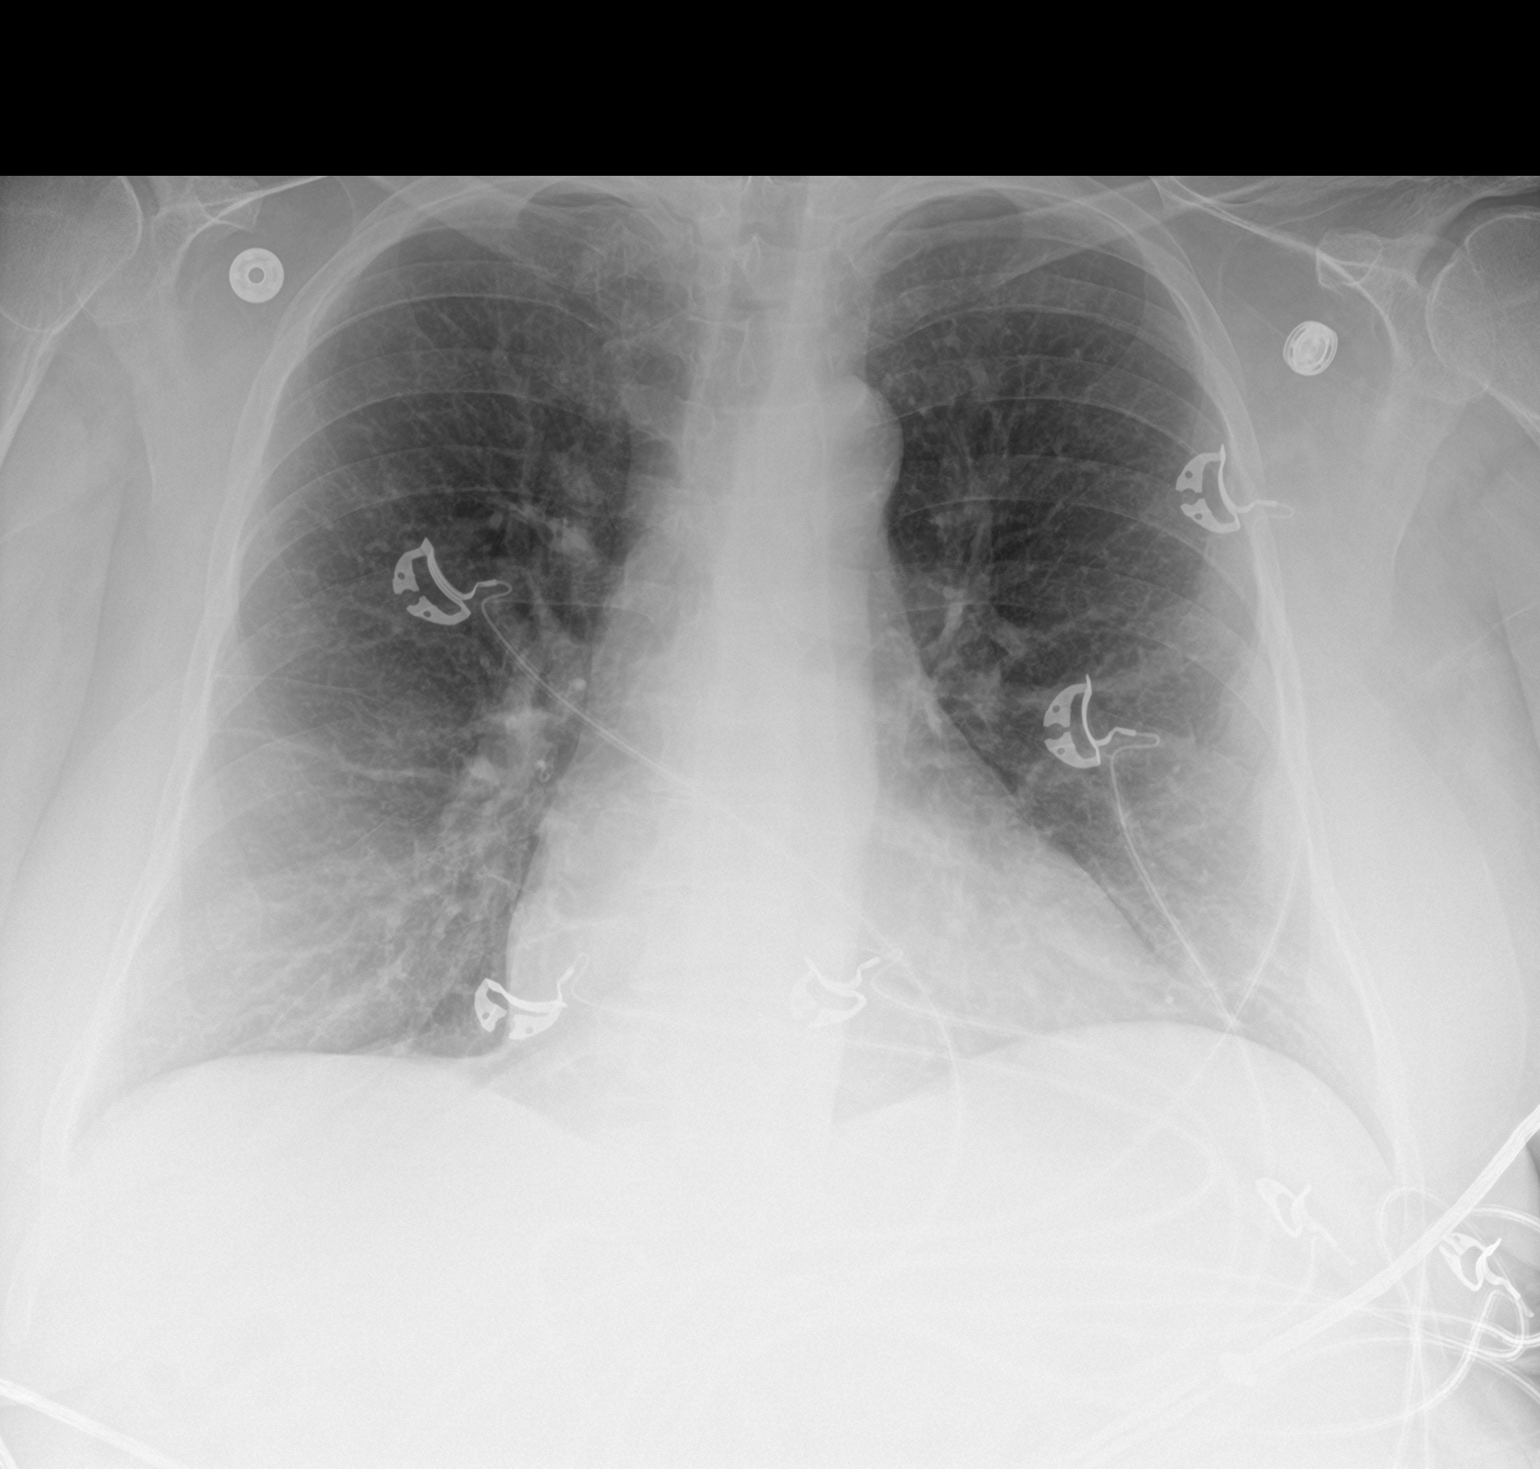

[2 of 2 positions shown; findings below may reference images not displayed]

FINDINGS: The heart size and mediastinal contours are within normal limits.
Mild scar is identified in the right lung base. There is no focal
infiltrate, pulmonary edema, or pleural effusion. The visualized
skeletal structures are stable.
IMPRESSION: No active cardiopulmonary disease.

## 2021-01-07 IMAGING — CT CT ABD-PELV W/ CM
2 of 5 series · 16 of 46 positions shown, 18 images · IV contrast (ISOVUE)
Comparison: 06/12/2017

CLINICAL DATA: Abdominal distension

EXAM:
CT ABDOMEN AND PELVIS WITH CONTRAST
TECHNIQUE: Multidetector CT imaging of the abdomen and pelvis was performed
using the standard protocol following bolus administration of
intravenous contrast.
CONTRAST:  100mL 1FWZMC-ELL IOPAMIDOL (1FWZMC-ELL) INJECTION 61%

[Series 2: axial st · axial · 0.82mm/px · z∈[+1002,+1382]mm · 13 of 88 slices shown, 15 images]
[im 6/88  soft-tissue]
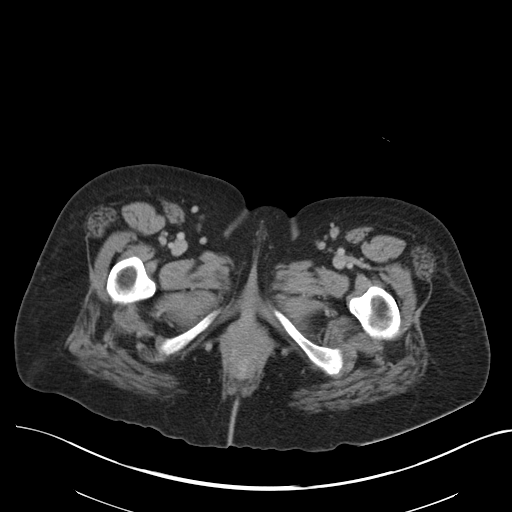
[im 6/88  bone]
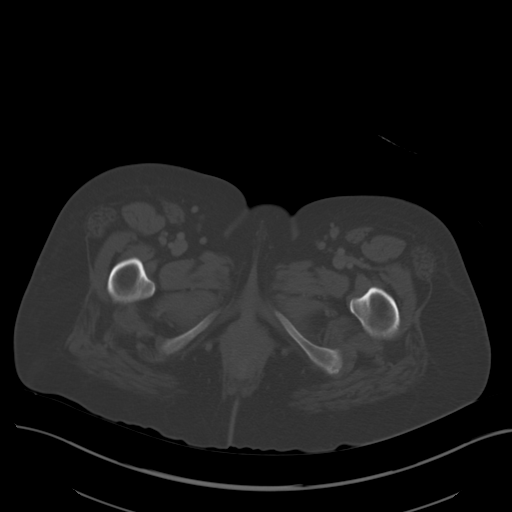
[im 12/88  soft-tissue]
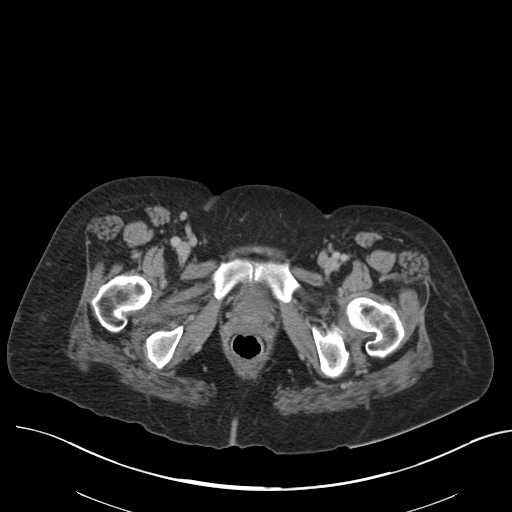
[im 18/88  soft-tissue]
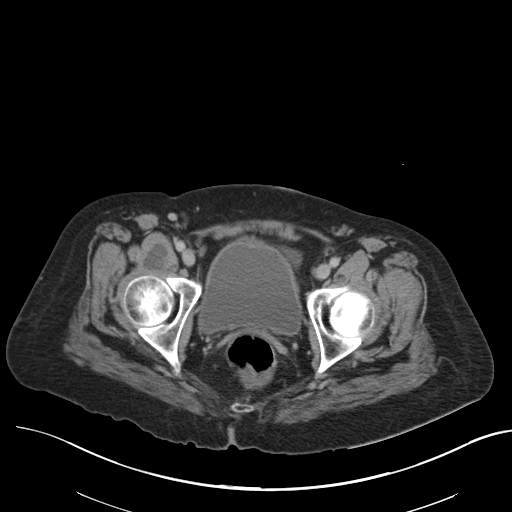
[im 24/88  soft-tissue]
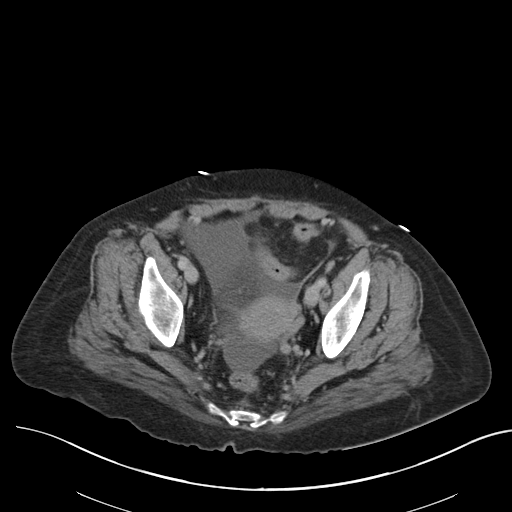
[im 30/88  soft-tissue]
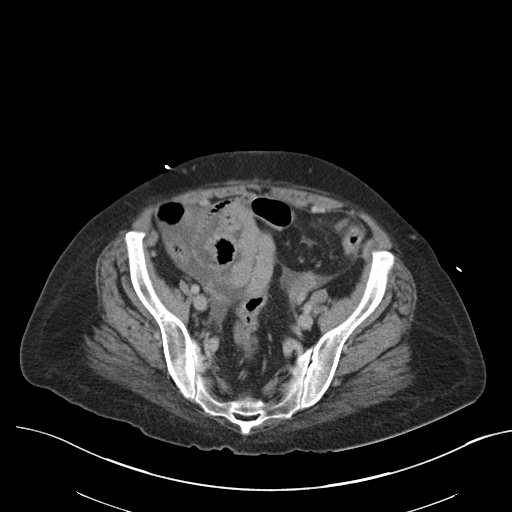
[im 35/88  soft-tissue]
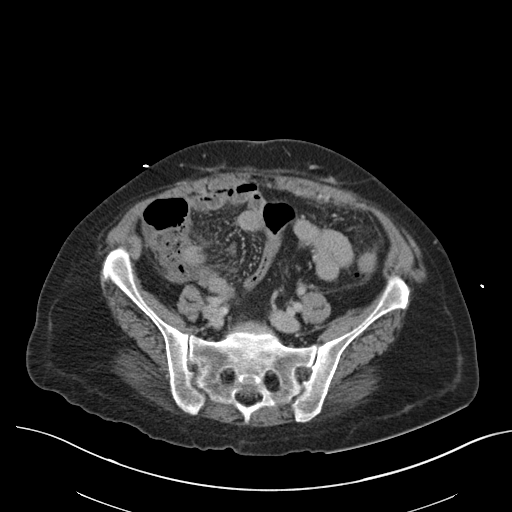
[im 47/88  soft-tissue]
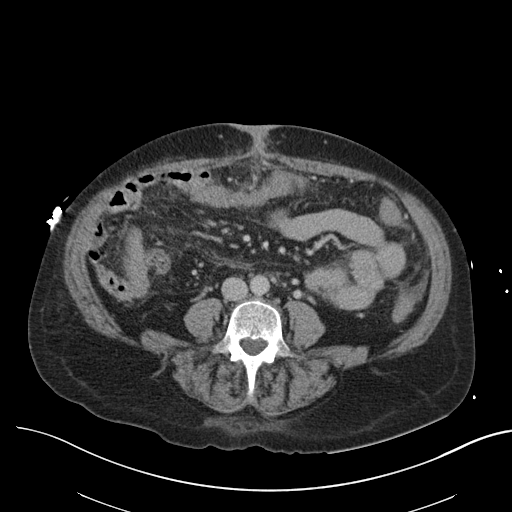
[im 53/88  soft-tissue]
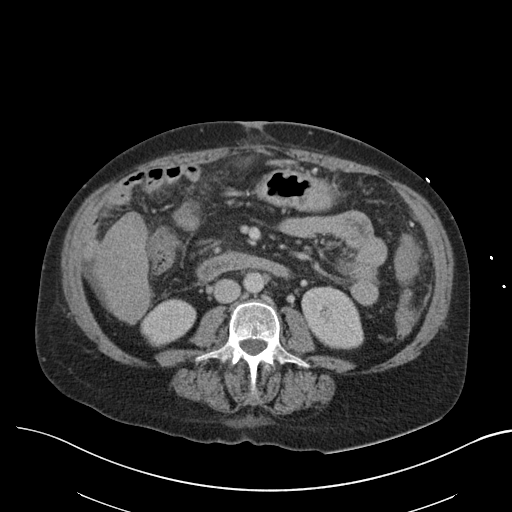
[im 59/88  soft-tissue]
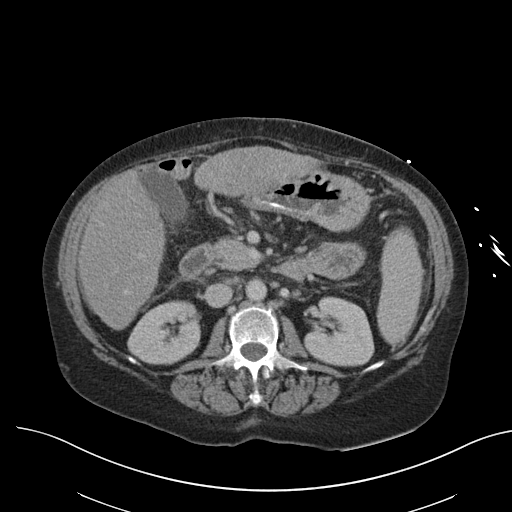
[im 59/88  bone]
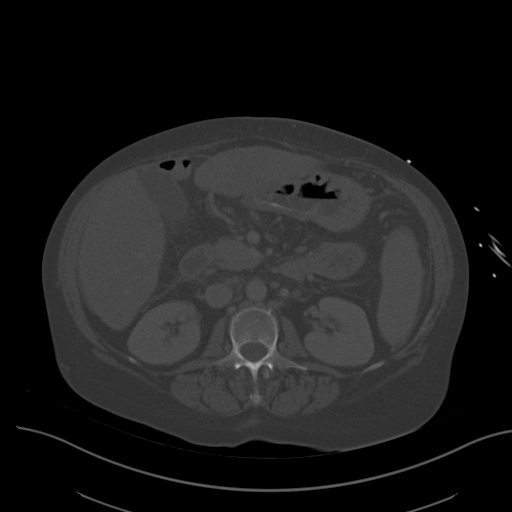
[im 64/88  soft-tissue]
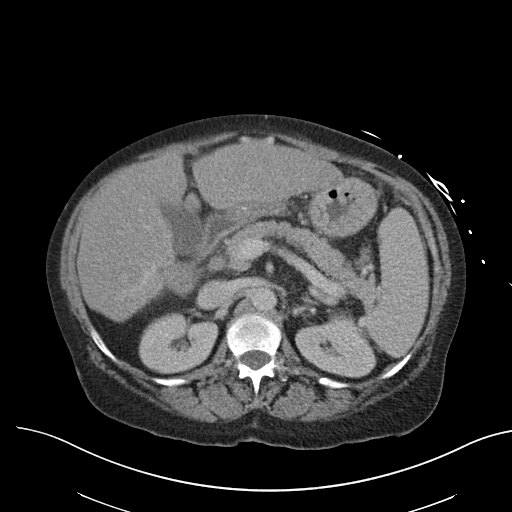
[im 70/88  soft-tissue]
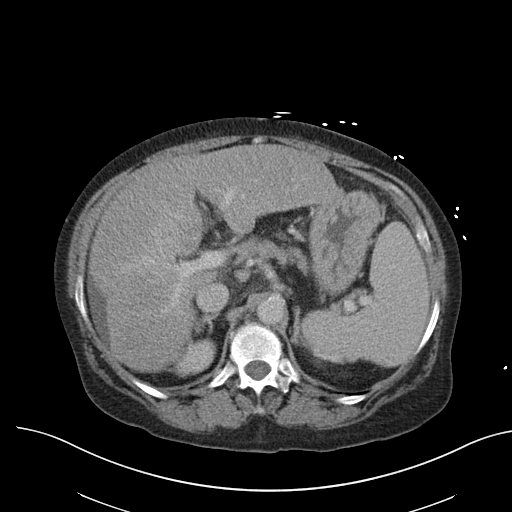
[im 76/88  soft-tissue]
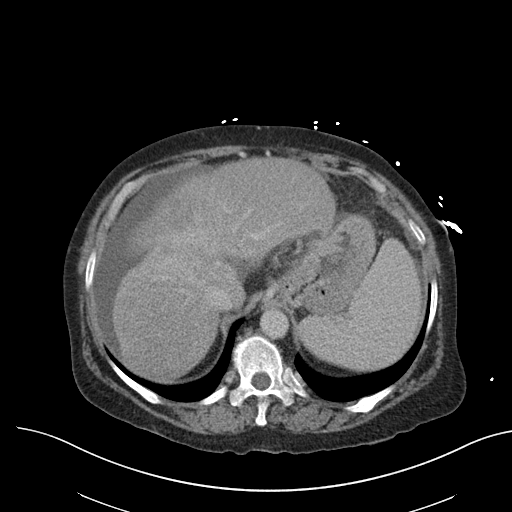
[im 82/88  soft-tissue]
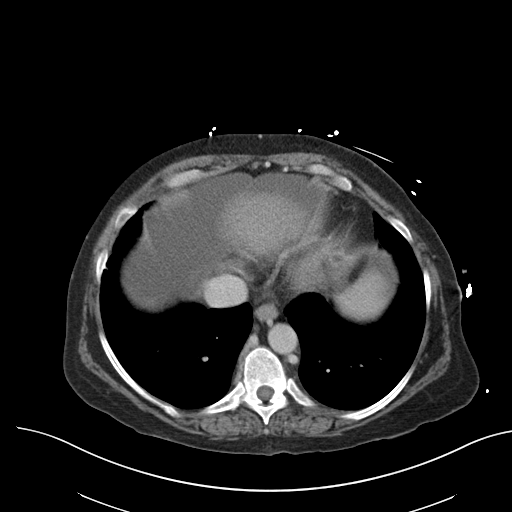

[Series 4: coronal st · coronal · 0.86mm/px · 3 of 88 slices shown]
[im 30/88  soft-tissue]
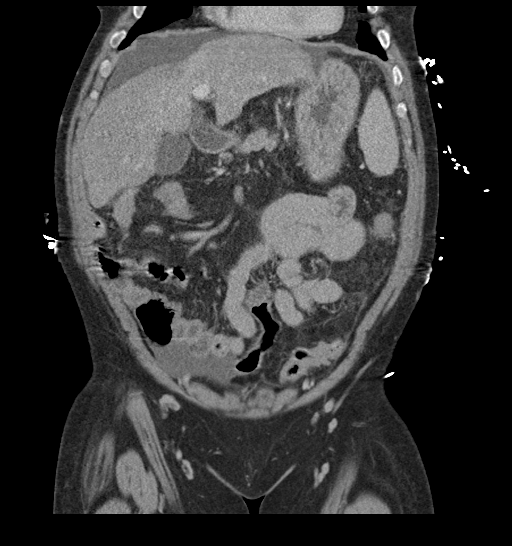
[im 39/88  soft-tissue]
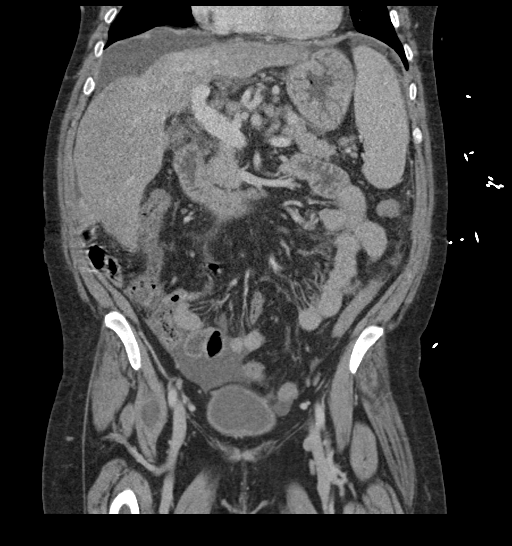
[im 49/88  soft-tissue]
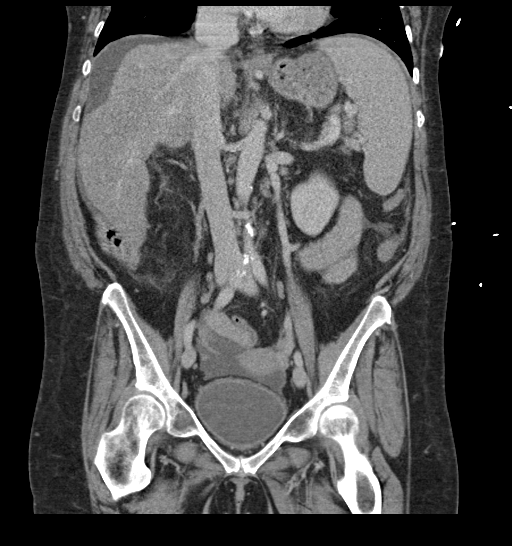

[16 of 46 positions shown; findings below may reference images not displayed]

FINDINGS: Lower chest: Lung bases are free of acute infiltrate or sizable
effusion.

Hepatobiliary: Heterogeneity of the liver is noted consistent with
underlying cirrhosis. Small hyperdense lesion is noted in the
lateral aspect of the left lobe of the liver best seen on image
number 17 of series 2 consistent with small hemangioma. This is
stable from the prior exam. Mild perihepatic fluid is noted.

Pancreas: Unremarkable. No pancreatic ductal dilatation or
surrounding inflammatory changes.

Spleen: Spleen is prominent with mild perisplenic varices consistent
with portal hypertension.

Adrenals/Urinary Tract: Adrenal glands are unremarkable. Kidneys are
normal, without renal calculi, focal lesion, or hydronephrosis.
Bladder is unremarkable.

Stomach/Bowel: Mild diverticular change of the colon is noted
without evidence of diverticulitis. The colon is predominately
decompressed. The appendix is air-filled and within normal limits.
Small bowel shows no obstructive changes. The stomach is within
normal limits. Scattered esophageal varices are noted distally.

Vascular/Lymphatic: Changes consistent with portal hypertension are
noted as described above. Aortic calcifications are noted without
aneurysmal dilatation. Scattered likely reactive lymph nodes are
noted adjacent to the celiac axis as well as in the portacaval space
and periaortic region.

Reproductive: Uterus and bilateral adnexa are unremarkable.

Other: Free fluid is noted within the pelvis consistent with ascites
and the underlying portal hypertension.

Musculoskeletal: Mild degenerative changes of lumbar spine are
noted.
IMPRESSION: Changes consistent with cirrhosis of the liver with portal
hypertension and variceal changes. Mild ascites and splenomegaly are
noted as well.

Diverticulosis without diverticulitis.

## 2021-01-07 IMAGING — CR DG CHEST 2V
2 series · 2 of 2 positions shown · non-contrast
Comparison: 02/23/2018.

CLINICAL DATA: Coughing and vomiting.

EXAM:
CHEST - 2 VIEW

[w chest lat]
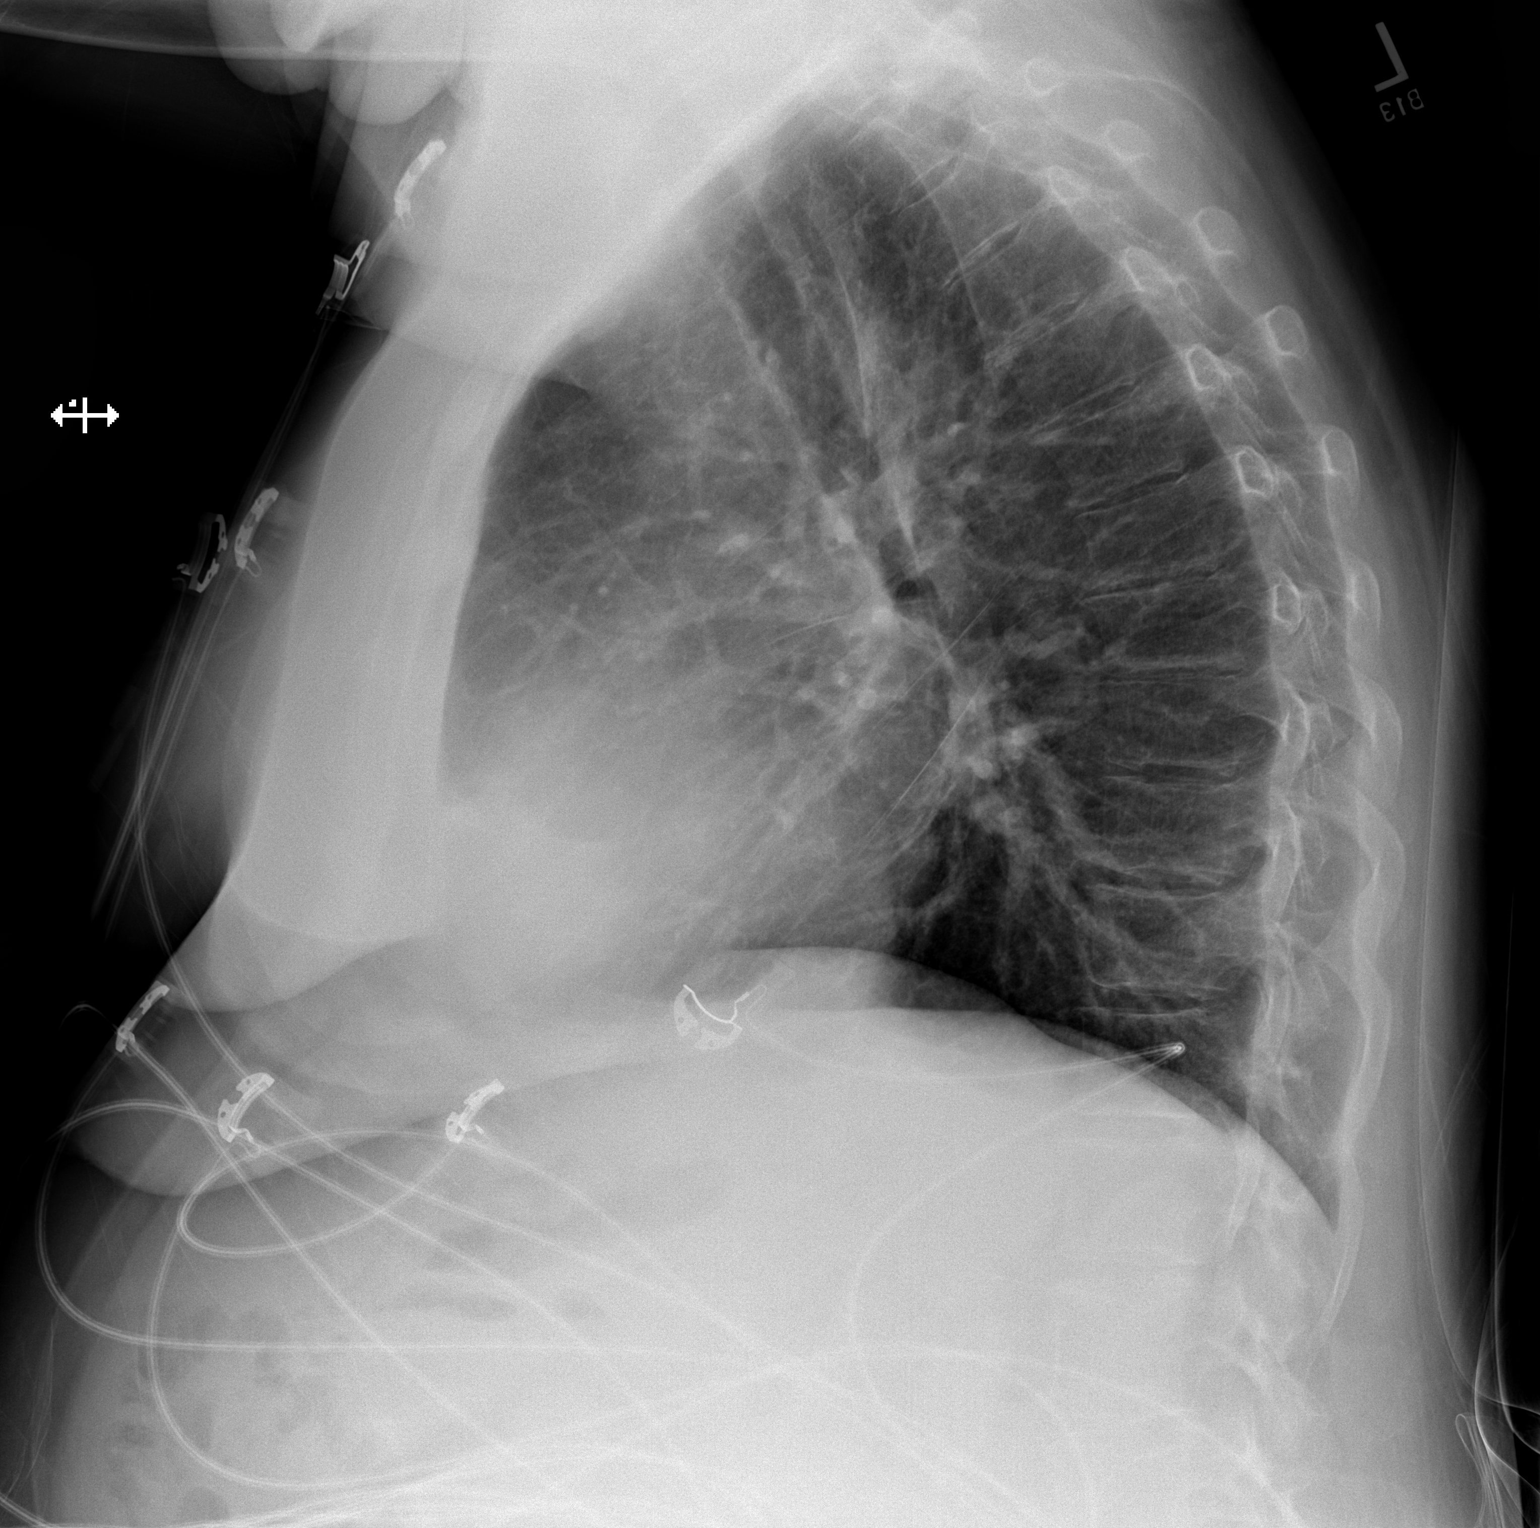

[x chest ap]
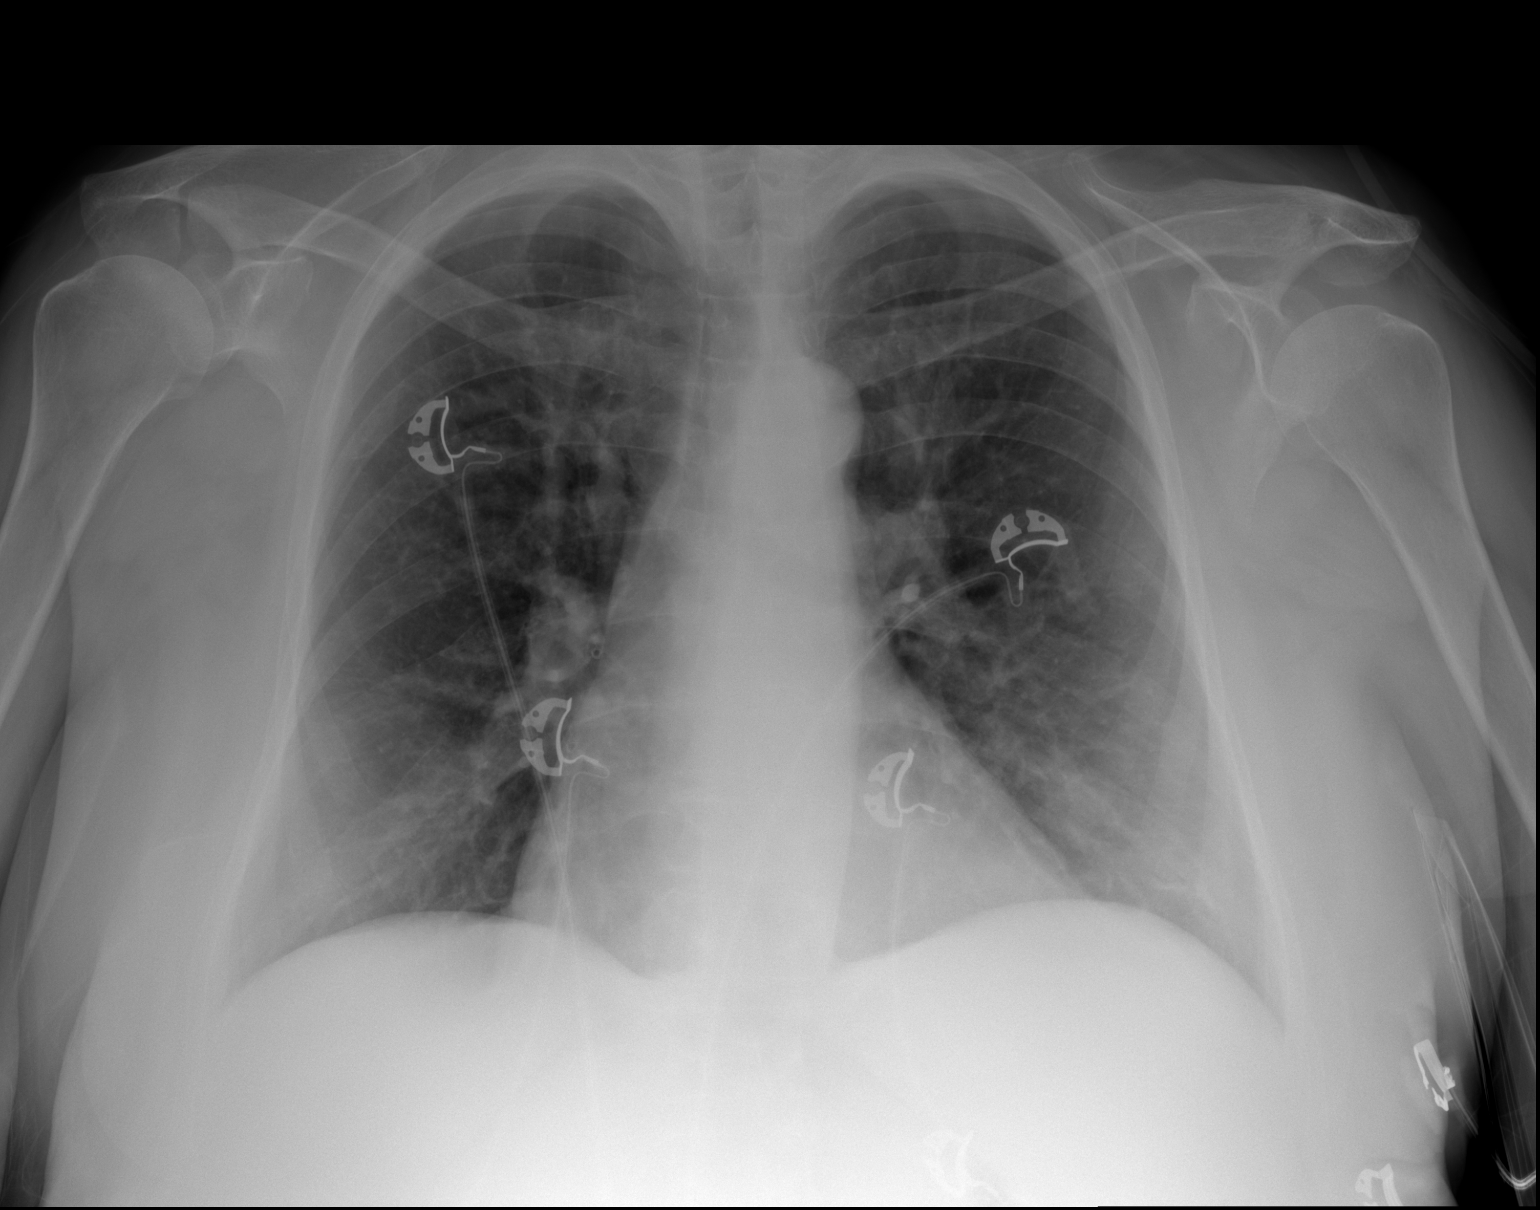

[2 of 2 positions shown; findings below may reference images not displayed]

FINDINGS: Mediastinum and hilar structures normal. Heart size normal. Mild
peribronchial cuffing. No focal infiltrate. No pleural effusion or
pneumothorax. No acute bony abnormality.
IMPRESSION: Mild peribronchial cuffing. Mild bronchitis could not be excluded.
Exam is otherwise unremarkable.

## 2021-01-23 IMAGING — CT CT HEAD W/O CM
3 series · 16 of 37 positions shown, 18 images · non-contrast
Comparison: 03/19/2017

CLINICAL DATA: Altered level of consciousness. History of alcohol
ingestion. Responsive to painful stimuli.

EXAM:
CT HEAD WITHOUT CONTRAST
TECHNIQUE: Contiguous axial images were obtained from the base of the skull
through the vertex without intravenous contrast.

[Series 3: head wo · axial · 0.44mm/px · z∈[-102,+8]mm · 6 of 32 slices shown, 8 images]
[im 5/32  brain]
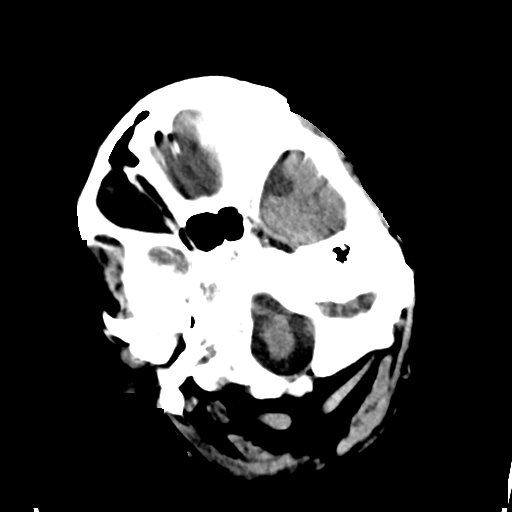
[im 5/32  bone]
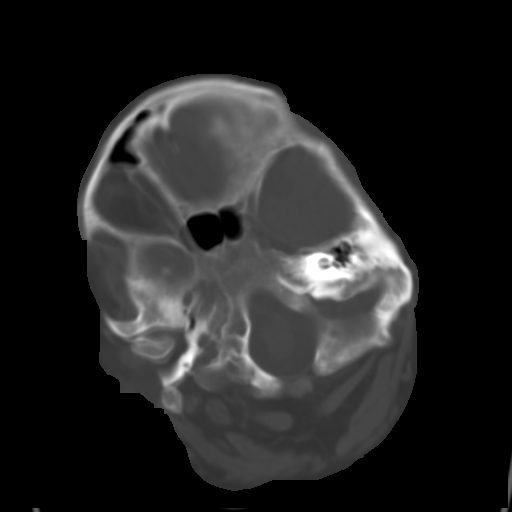
[im 9/32  brain]
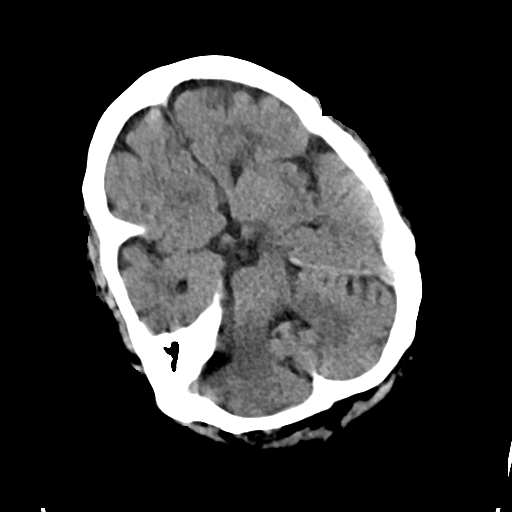
[im 14/32  brain]
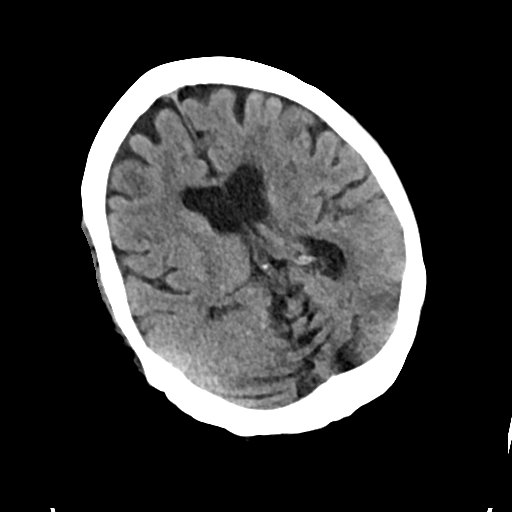
[im 18/32  brain]
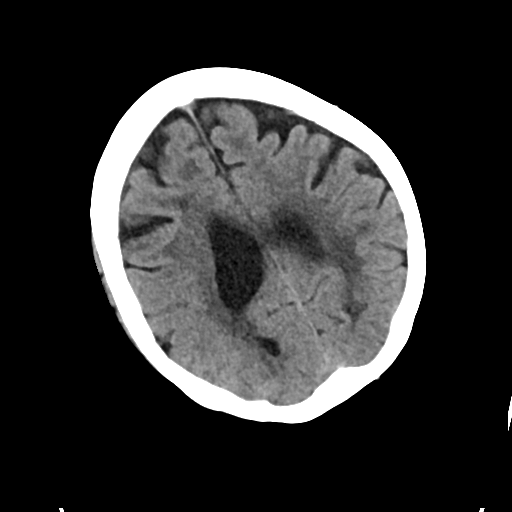
[im 23/32  brain]
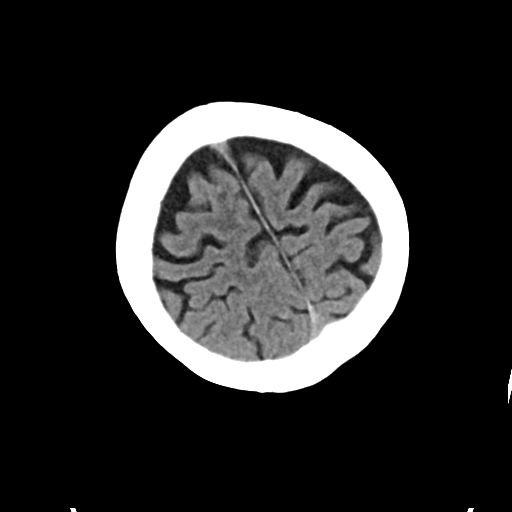
[im 23/32  bone]
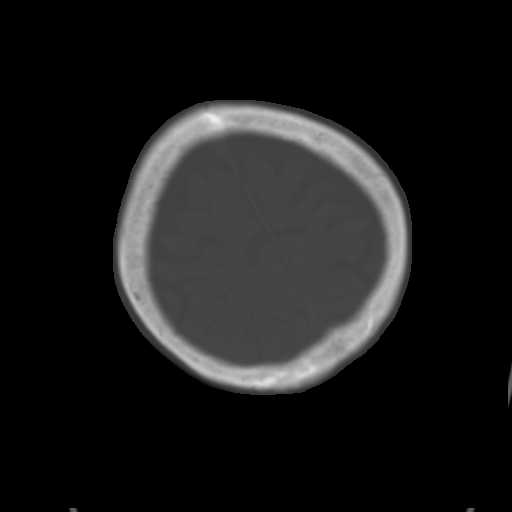
[im 27/32  brain]
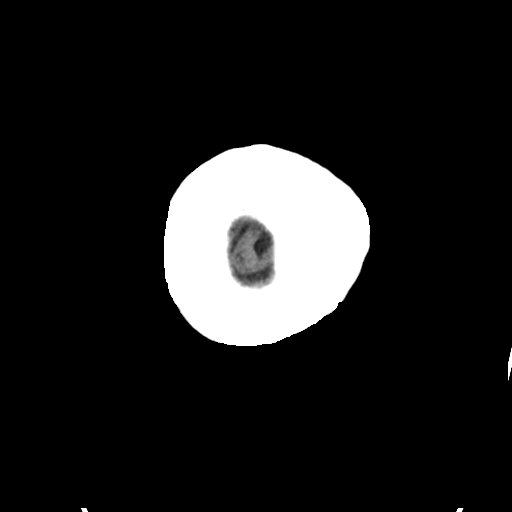

[Series 4: head bone · axial · 0.44mm/px · z∈[-108,+6]mm · 7 of 81 slices shown]
[im 8/81  bone]
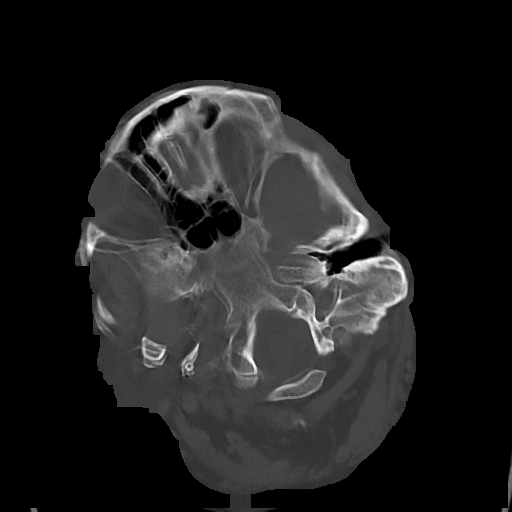
[im 16/81  bone]
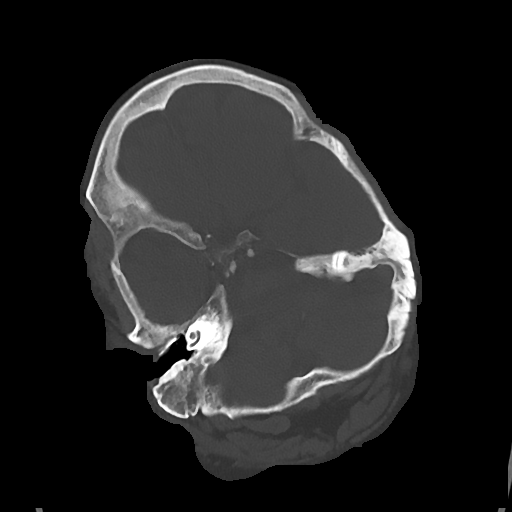
[im 27/81  bone]
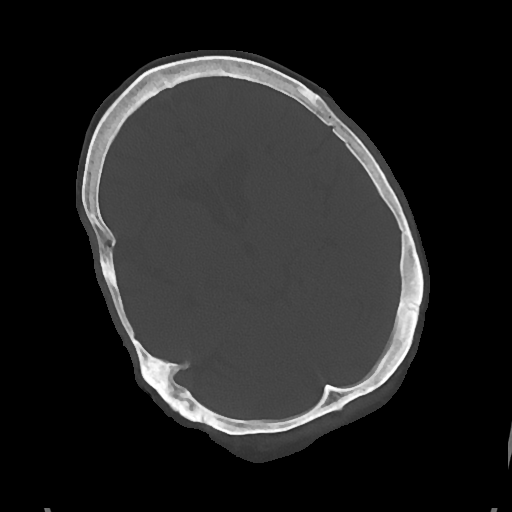
[im 35/81  bone]
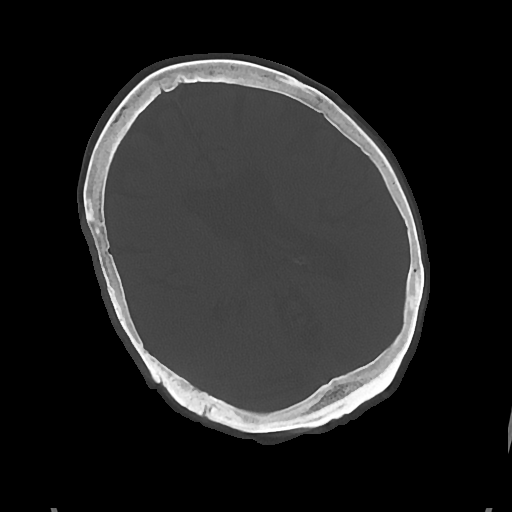
[im 46/81  bone]
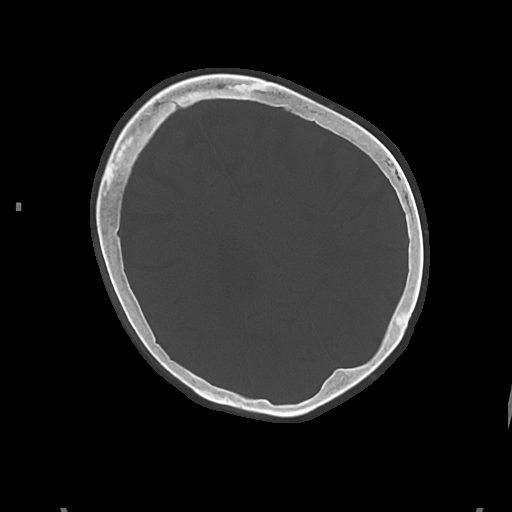
[im 54/81  bone]
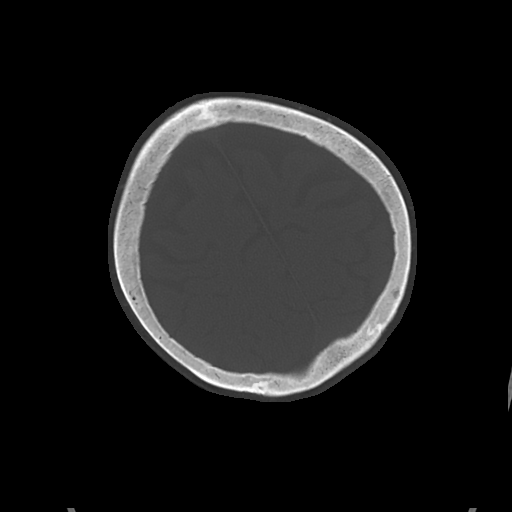
[im 65/81  bone]
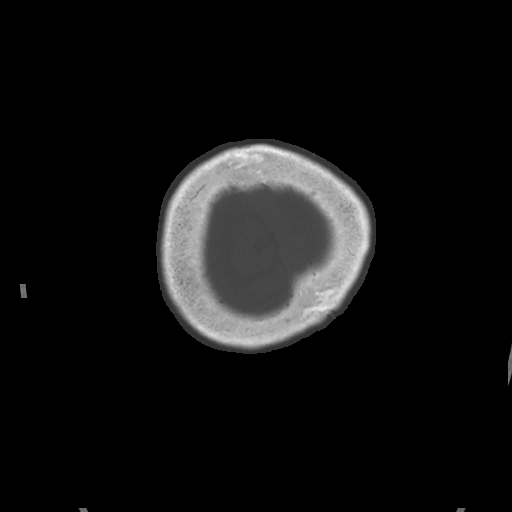

[Series 6: sag soft · sagittal · 0.31mm/px · 3 of 61 slices shown]
[im 25/61  brain]
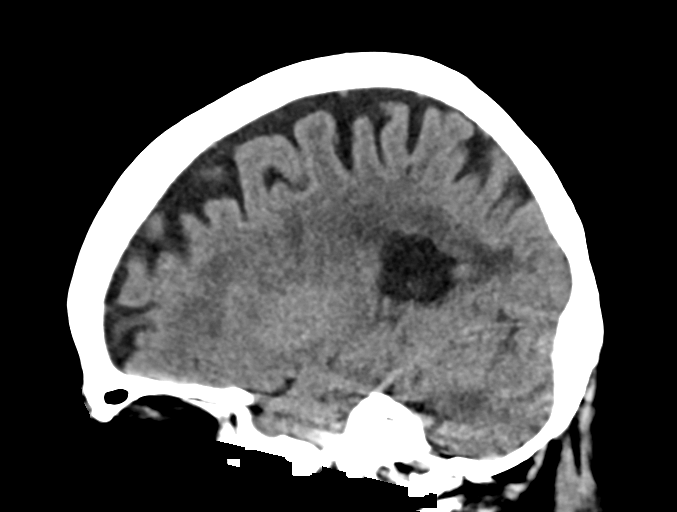
[im 31/61  brain]
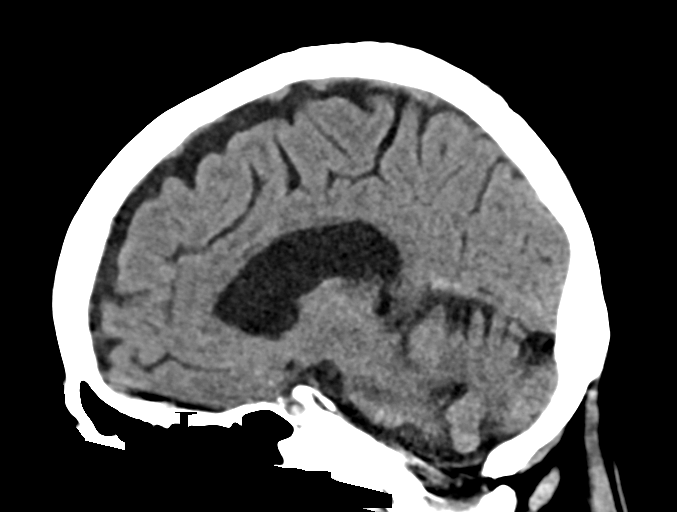
[im 37/61  brain]
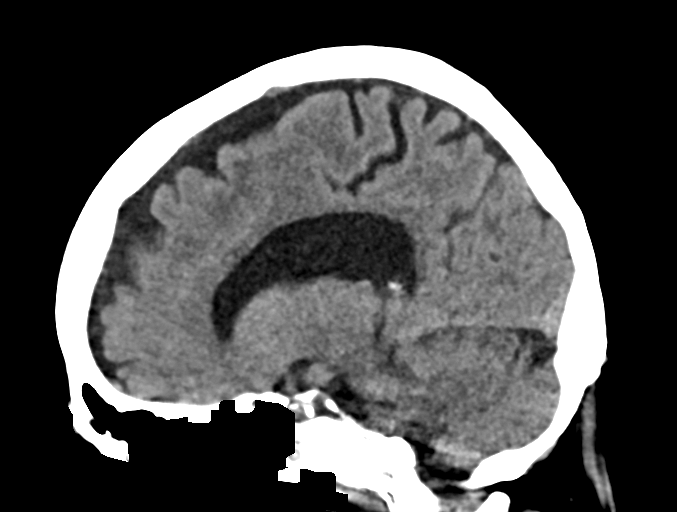

[16 of 37 positions shown; findings below may reference images not displayed]

FINDINGS: Motion artifact limits examination.

Brain: Mild diffuse cerebral atrophy. Mild ventricular dilatation
consistent with central atrophy. Patchy low-attenuation changes in
the deep white matter likely due to small vessel ischemia. No
mass-effect or midline shift. No abnormal extra-axial fluid
collections. Gray-white matter junctions are distinct. Basal
cisterns are not effaced. No acute intracranial hemorrhage.

Vascular: No hyperdense vessel or unexpected calcification.

Skull: Calvarium appears intact. No acute depressed skull fractures
identified.

Sinuses/Orbits: Visualized paranasal sinuses and mastoid air cells
are clear. Visualization is severely limited due to motion artifact.

Other: None.
IMPRESSION: No acute intracranial abnormalities. Chronic atrophy and small
vessel ischemic changes.

## 2021-01-23 IMAGING — DX DG CHEST 1V PORT
1 series · 1 of 1 positions shown · non-contrast
Comparison: Chest radiograph February 28, 2018

CLINICAL DATA: Poorly responsive after alcohol intake.

EXAM:
PORTABLE CHEST 1 VIEW

[chest ap]
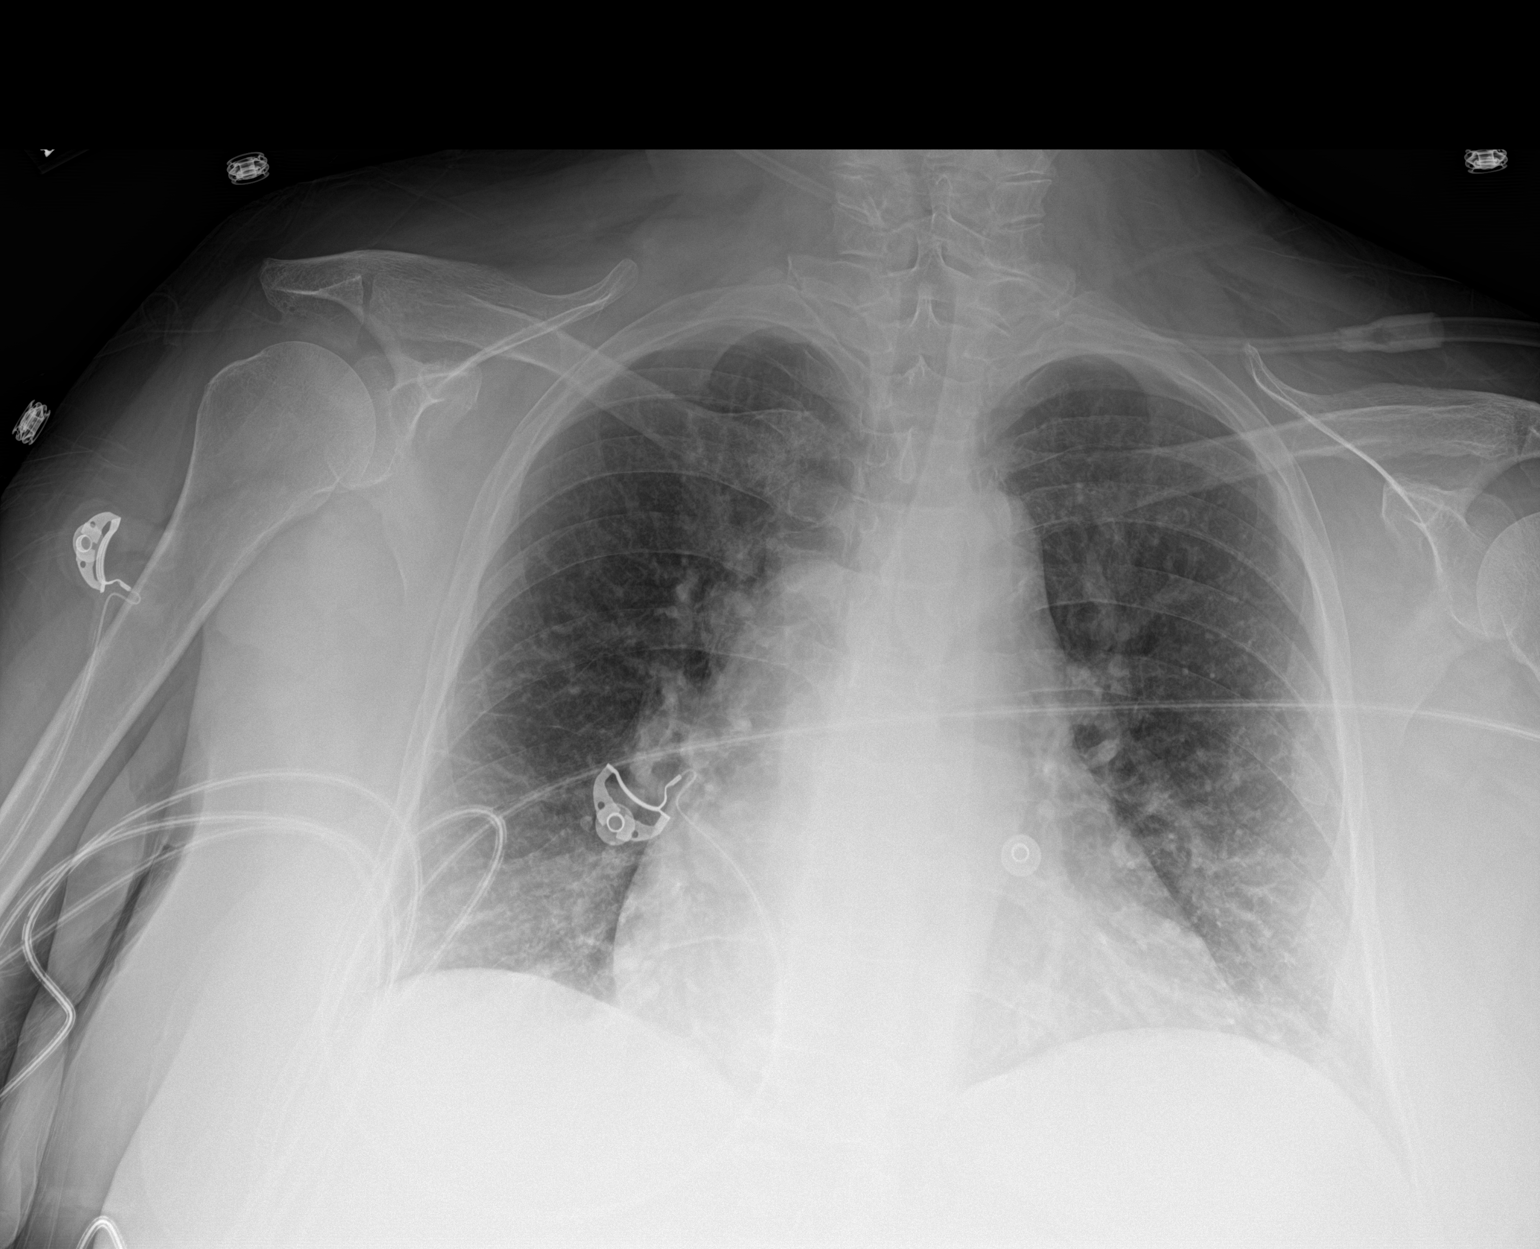

[1 of 1 positions shown; findings below may reference images not displayed]

FINDINGS: Stable cardiomegaly. Interstitial prominence with pulmonary vascular
congestion. No pleural effusion or focal consolidation. No
pneumothorax. Old LEFT rib fractures. Breast implants.
IMPRESSION: Stable cardiomegaly and pulmonary vascular congestion.

Interstitial prominence seen with atypical infection and/or
pulmonary edema.

## 2021-01-25 IMAGING — CT CT MAXILLOFACIAL W/O CM
3 of 7 series · 12 of 47 positions shown, 14 images · non-contrast
Comparison: 03/18/2018 CT head.

CLINICAL DATA: 57 y/o F; found on floor with laceration to head and
injury to right cheek bone.

EXAM:
CT MAXILLOFACIAL WITHOUT CONTRAST
CT CERVICAL SPINE WITHOUT CONTRAST
TECHNIQUE: Multidetector CT imaging of the maxillofacial structures was
performed. Multiplanar CT image reconstructions were also generated.
A small metallic BB was placed on the right temple in order to
reliably differentiate right from left.
Multidetector CT imaging of the cervical spine was performed without
intravenous contrast. Multiplanar CT image reconstructions were also
generated.

[Series 7: orthogonal axials · axial · 0.23mm/px · z∈[-311,-134]mm · 8 of 114 slices shown, 10 images]
[im 11/114  brain]
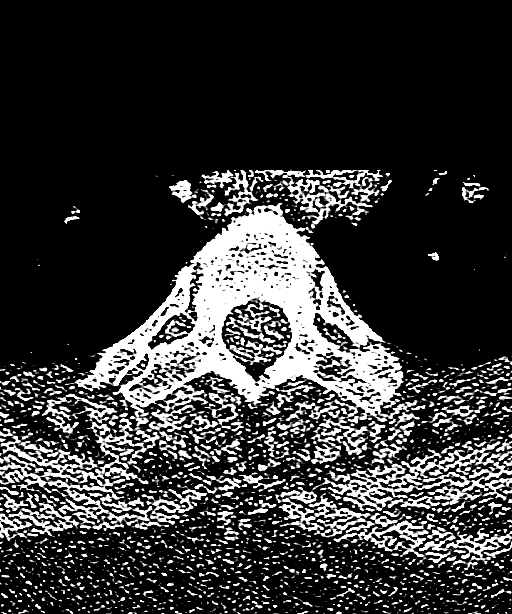
[im 11/114  bone]
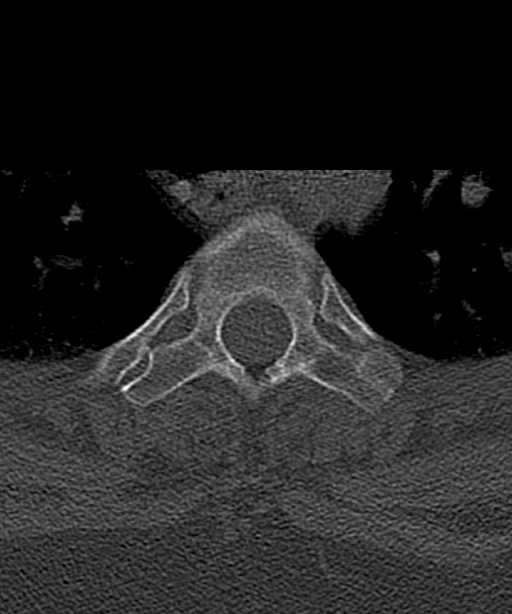
[im 21/114  bone]
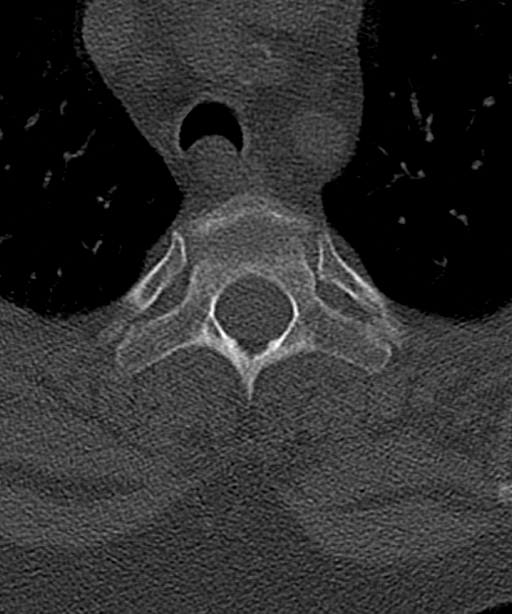
[im 42/114  bone]
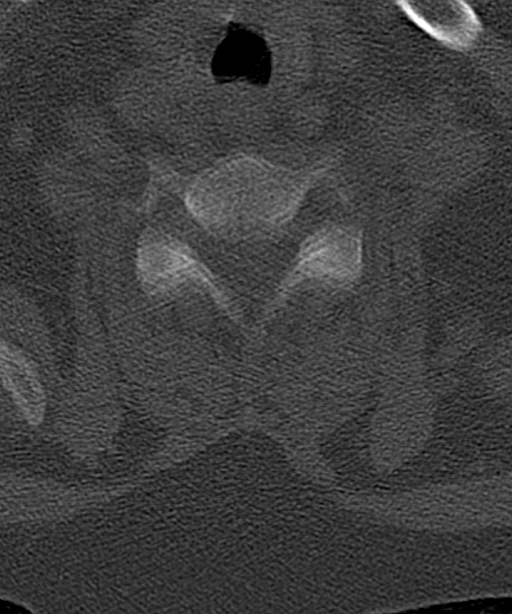
[im 52/114  bone]
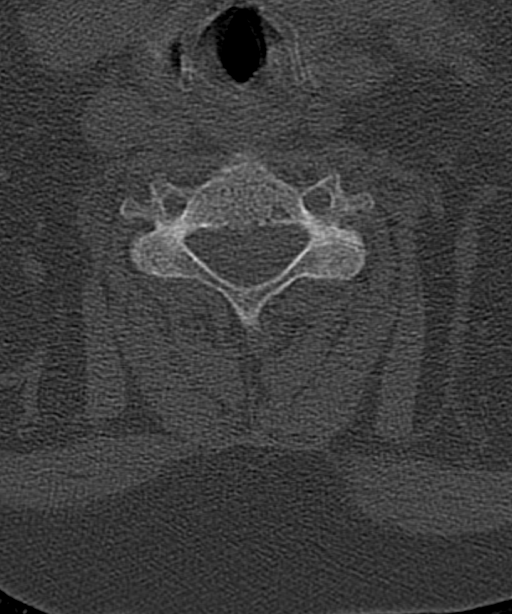
[im 62/114  brain]
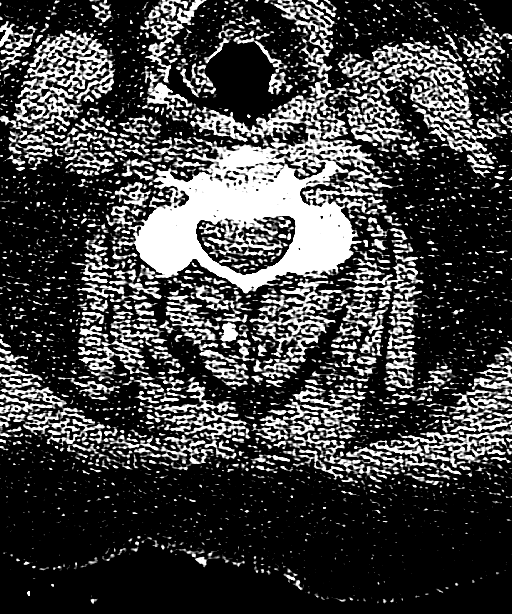
[im 62/114  bone]
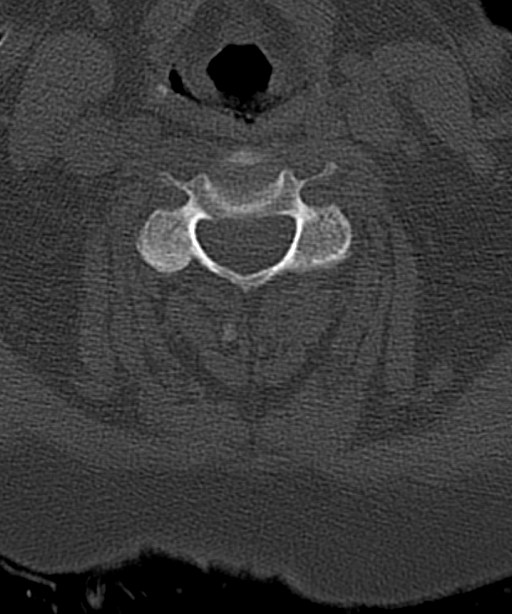
[im 72/114  bone]
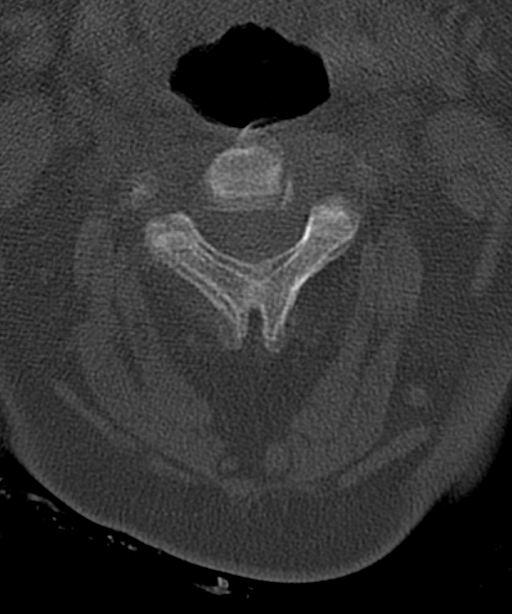
[im 93/114  bone]
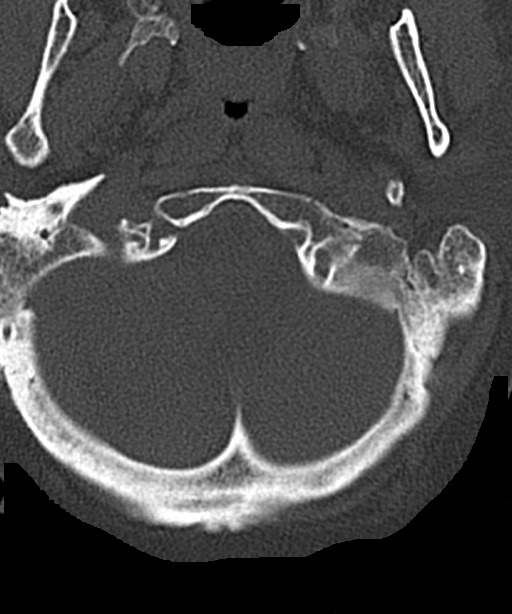
[im 103/114  bone]
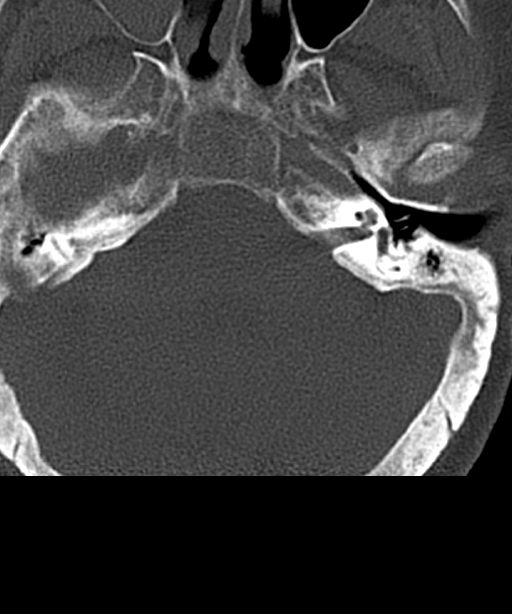

[Series 8: coronal bone · coronal · 0.28mm/px · 3 of 69 slices shown]
[im 18/69  bone]
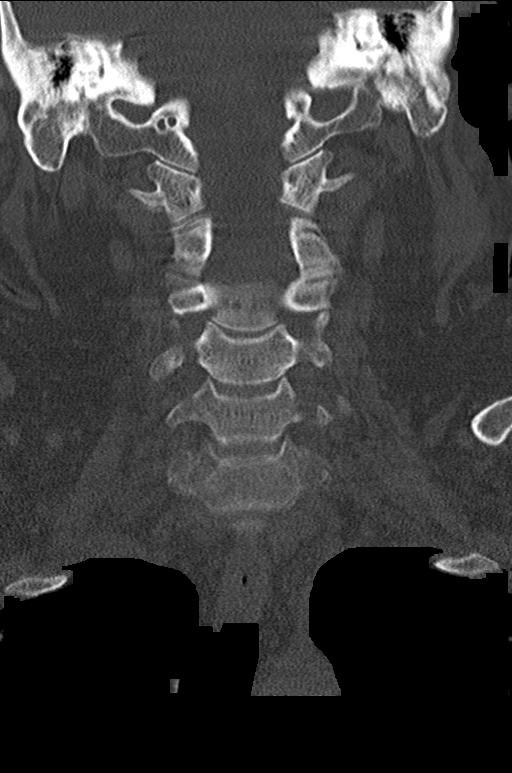
[im 35/69  bone]
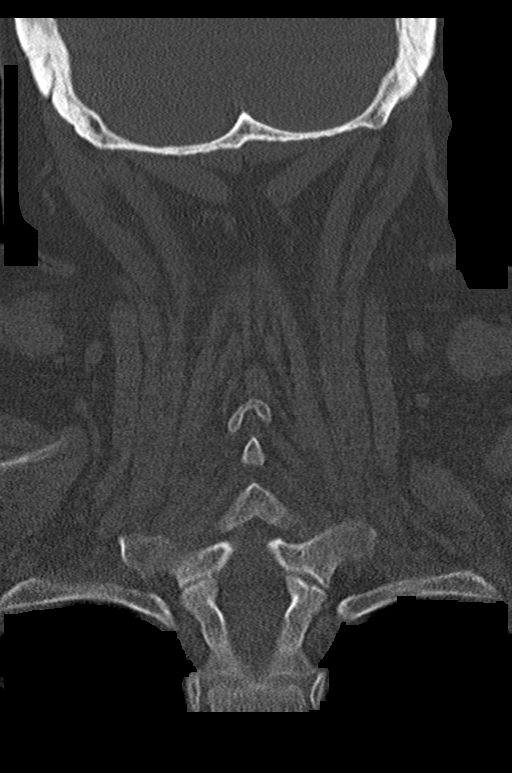
[im 52/69  bone]
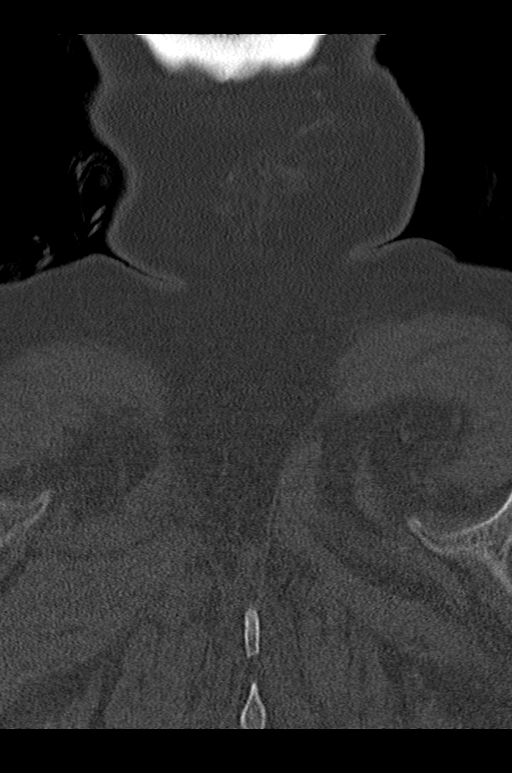

[Series 9: sagittal bone · sagittal · 0.29mm/px · 1 of 61 slices shown]
[im 31/61  bone]
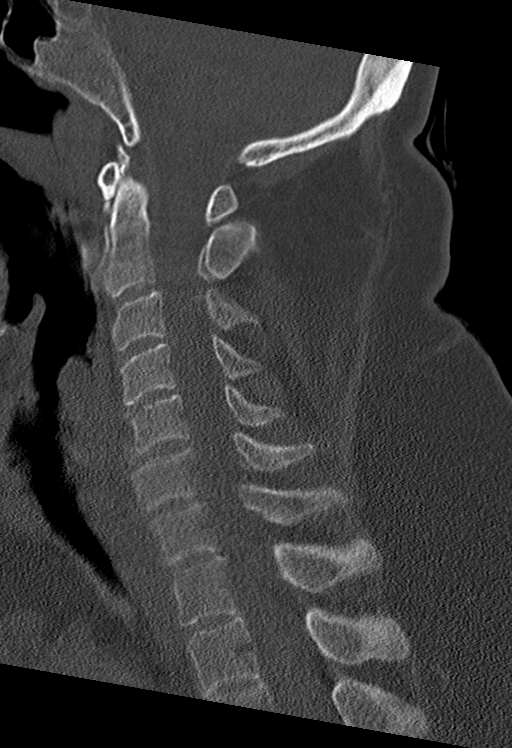

[12 of 47 positions shown; findings below may reference images not displayed]

FINDINGS: CT MAXILLOFACIAL FINDINGS

Osseous: Multiple levels of motion artifact through the face. Acute
fracture of the right floor of orbit measuring 12 x 10 mm (AP by ML
series 16, image 30 and series 15, image 29) with 6 mm inferior
displacement into the right maxillary sinus. No additional displaced
facial fracture or mandibular dislocation identified.

Orbits: Small amount of emphysema is present within the right
inferior palpebral as well as the extraconal fat of the right
inferior orbit. No orbital or retro-orbital hematoma. No significant
proptosis. Extraocular muscles are intact without findings of
entrapment.

Normal left orbit.

Sinuses: Right maxillary sinus blood fluid level. Additional visible
paranasal sinuses are normally aerated. Under pneumatization of the
mastoid air cells. Pneumatized mastoid air cells are normally
aerated.

Soft tissues: Contusion of right facial and periorbital soft
tissues.

Limited intracranial: Please refer to same-day CT head.

CT CERVICAL FINDINGS

Alignment: Normal.

Skull base and vertebrae: Motion artifact at the C2 and C6 levels.
No acute fracture identified. No primary bone lesion or focal
pathologic process.

Soft tissues and spinal canal: No prevertebral fluid or swelling. No
visible canal hematoma.

Disc levels: Mild multilevel discogenic degenerative changes with
small endplate marginal osteophytes. No significant bony foraminal
or spinal canal stenosis.

Upper chest: Negative.

Other: Negative.
IMPRESSION: Motion degraded study.

1. Acute fracture of the right floor of orbit measuring up to 12 mm
with 6 mm inferior displacement into the right maxillary sinus. Mild
infraorbital extraconal pneumatosis. No orbital or retro-orbital
hematoma. No significant proptosis. No muscle entrapment.
2. Right maxillary sinus blood fluid level.
3. No additional displaced fracture or dislocation of the facial
bones.
4. Contusion of right facial and periorbital soft tissues.
5. No acute fracture or dislocation of the cervical spine.
6. Mild cervical spine degenerative changes. No significant bony
foraminal or spinal canal stenosis.

## 2021-01-25 IMAGING — CT CT HEAD W/O CM
3 of 4 series · 15 of 47 positions shown, 18 images · non-contrast
Comparison: 03/16/2018 head CT.

CLINICAL DATA: Syncopal episode. Posterior head laceration. Found
down on floor. Multiple sclerosis.

EXAM:
CT HEAD WITHOUT CONTRAST
TECHNIQUE: Contiguous axial images were obtained from the base of the skull
through the vertex without intravenous contrast.

[Series 2: head wo · axial · 0.42mm/px · z∈[-132,-12]mm · 9 of 31 slices shown, 12 images]
[im 4/31  brain]
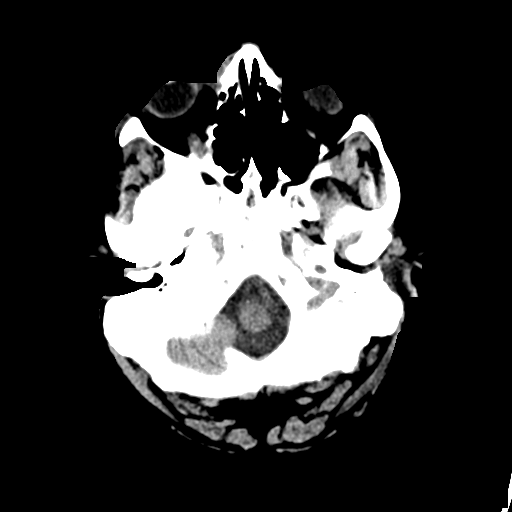
[im 4/31  bone]
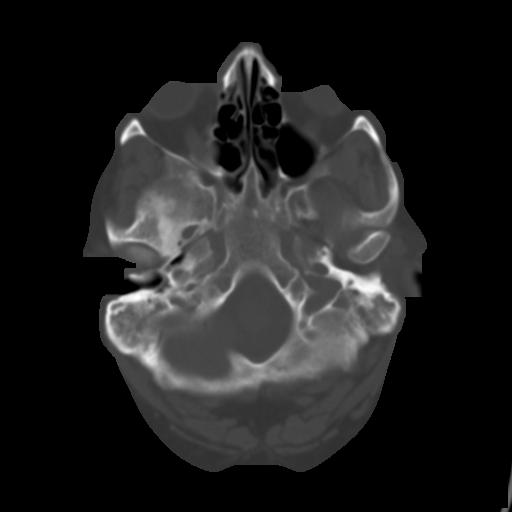
[im 7/31  brain]
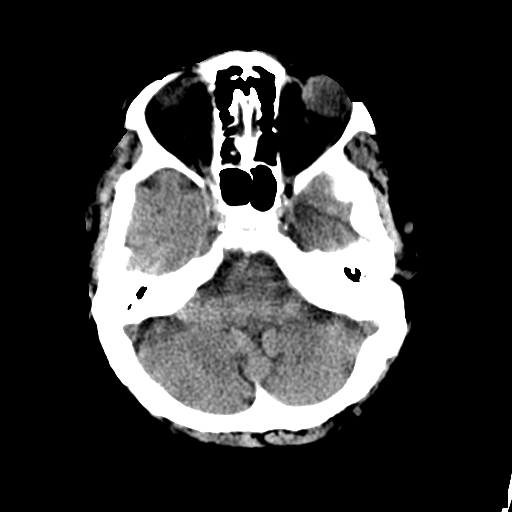
[im 10/31  brain]
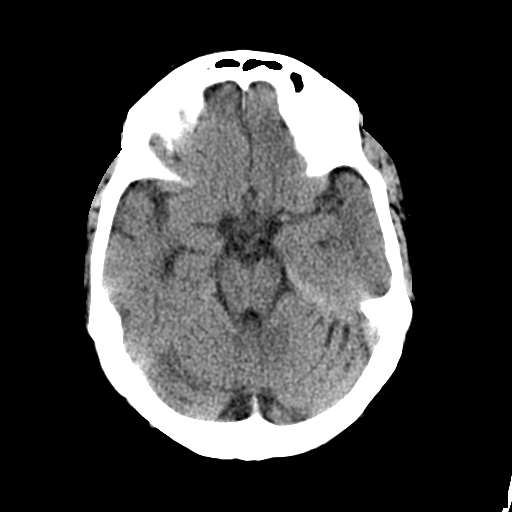
[im 13/31  brain]
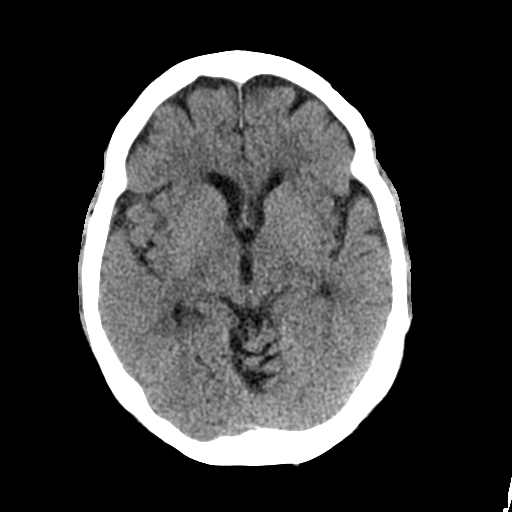
[im 16/31  brain]
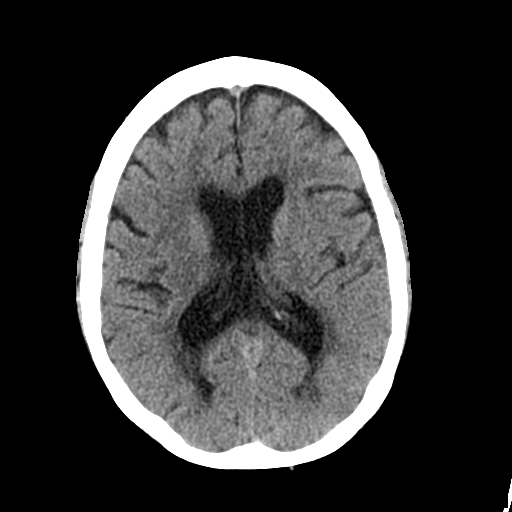
[im 16/31  bone]
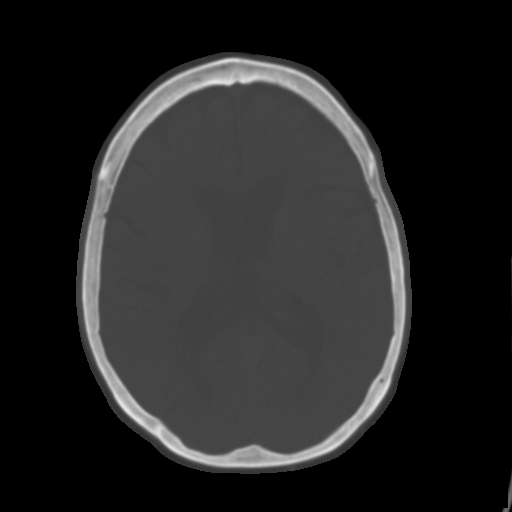
[im 19/31  brain]
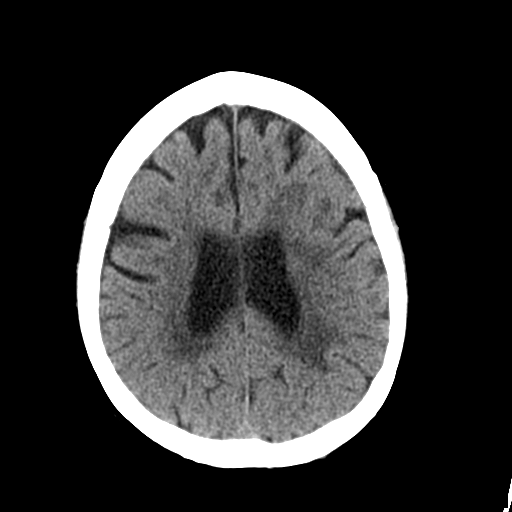
[im 22/31  brain]
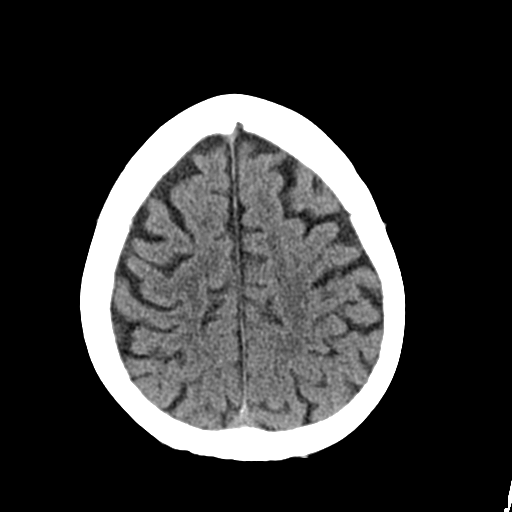
[im 25/31  brain]
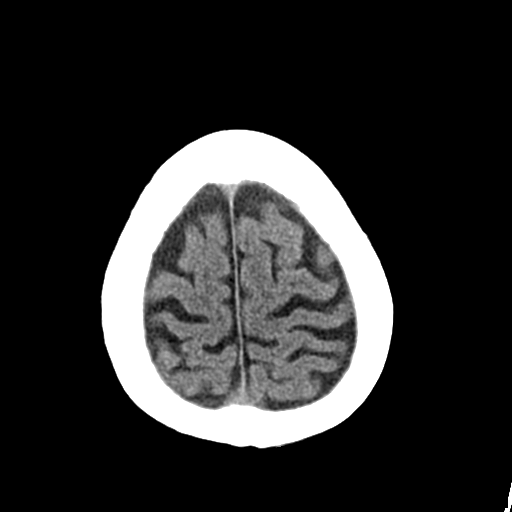
[im 28/31  brain]
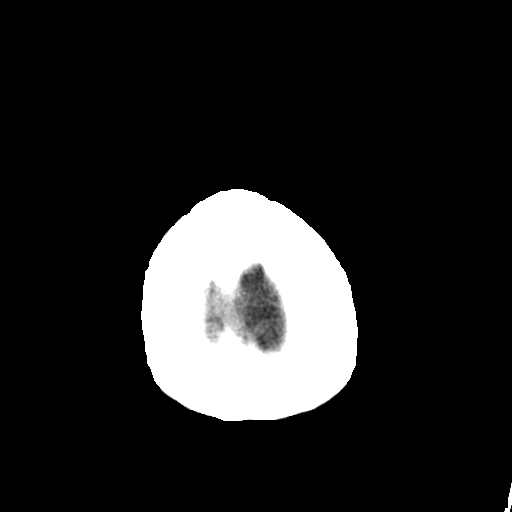
[im 28/31  bone]
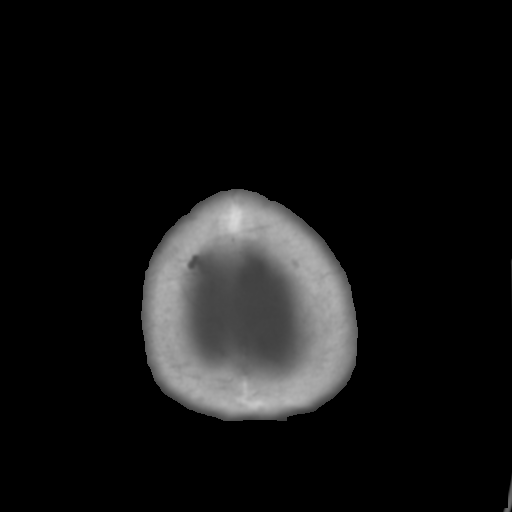

[Series 5: coronal soft tissue · coronal · 0.30mm/px · 3 of 65 slices shown]
[im 22/65  brain]
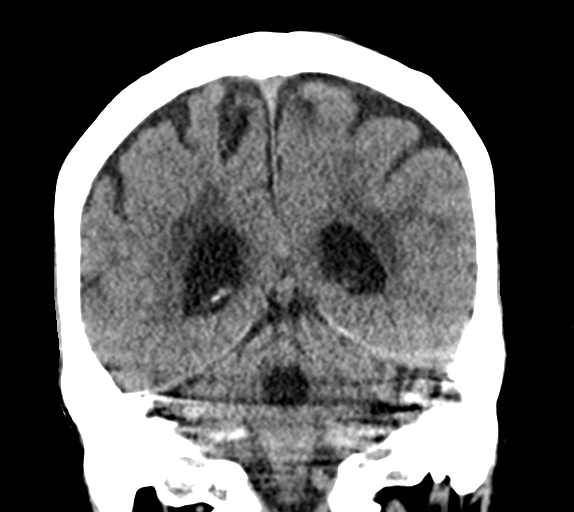
[im 29/65  brain]
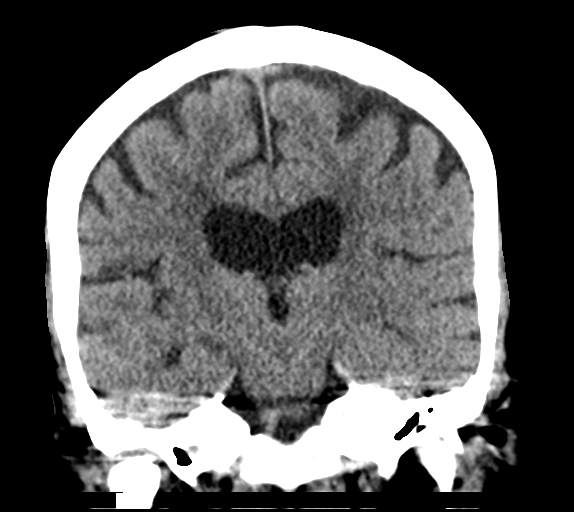
[im 36/65  brain]
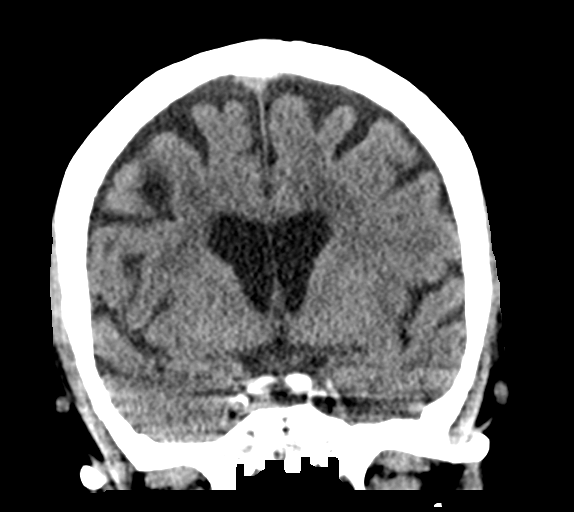

[Series 6: sagittal soft tissue · sagittal · 0.31mm/px · 3 of 51 slices shown]
[im 17/51  brain]
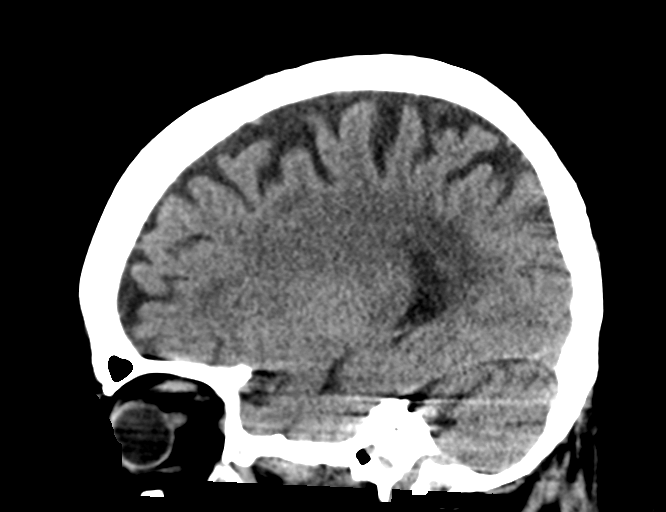
[im 26/51  brain]
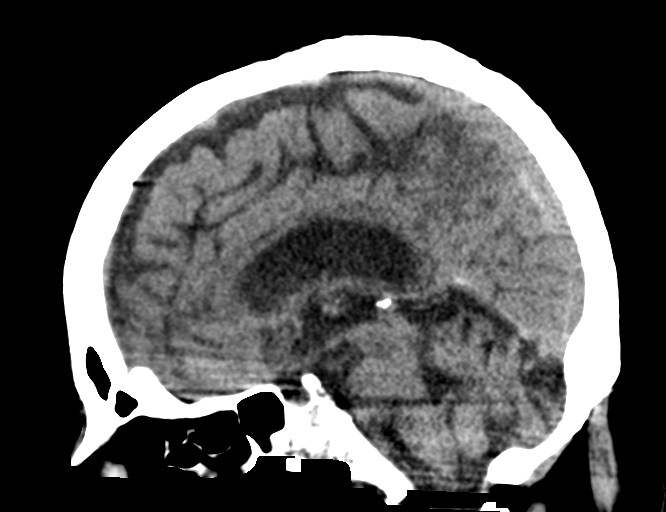
[im 34/51  brain]
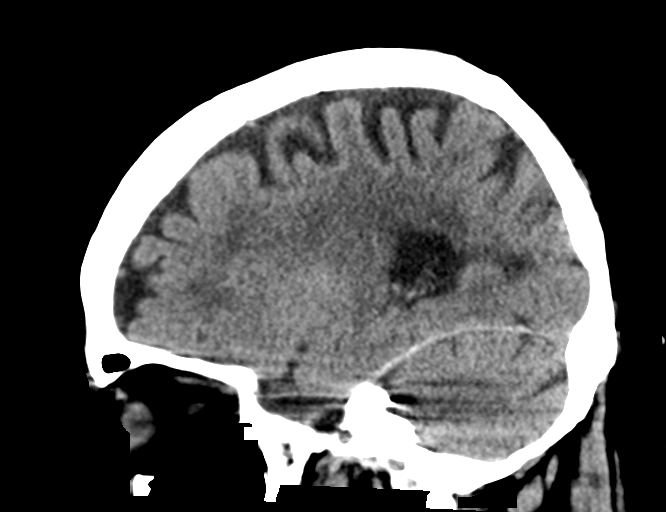

[15 of 47 positions shown; findings below may reference images not displayed]

FINDINGS: Brain: Nonspecific moderate subcortical and periventricular white
matter hypodensity, stable. No evidence of parenchymal hemorrhage or
extra-axial fluid collection. No mass lesion, mass effect, or
midline shift. No CT evidence of acute infarction. Cerebral volume
is age appropriate. No ventriculomegaly.

Vascular: No acute abnormality.

Skull: No evidence of calvarial fracture.

Sinuses/Orbits: There is an acute fracture of right orbital floor
with near complete opacification of the right maxillary sinus. No
evidence of herniated right orbital muscle. Soft tissue emphysema is
noted in. Septal inferior right orbit.

Other: Small scalp laceration with associated minimal scalp
emphysema in the medial left parietal scalp. The mastoid air cells
are unopacified.
IMPRESSION: 1. Small scalp laceration with associated minimal scalp emphysema in
the medial left parietal scalp.
2. No evidence of acute intracranial abnormality. No evidence of
calvarial fracture.
3. Acute fracture of the right orbital floor with near complete
opacification of the right maxillary sinus. Soft tissue emphysema in
the inferior right orbit.
4. Chronic moderate white matter low-attenuation foci compatible
with the provided clinical history of multiple sclerosis.

## 2021-01-26 IMAGING — US US ABDOMEN COMPLETE
1 series · 13 of 25 positions shown · non-contrast
Comparison: CT of the abdomen and pelvis on 02/28/2018

CLINICAL DATA: Abdominal pain.  Abdominal hernia.  Cirrhosis.

EXAM:
ABDOMEN ULTRASOUND COMPLETE

[Series 1: us abdomen complete · 13 of 55 slices shown]
[im 1/55]
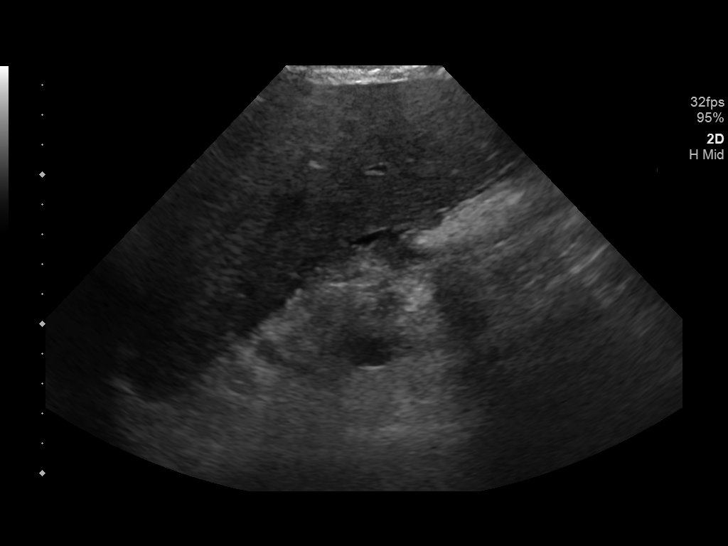
[im 5/55]
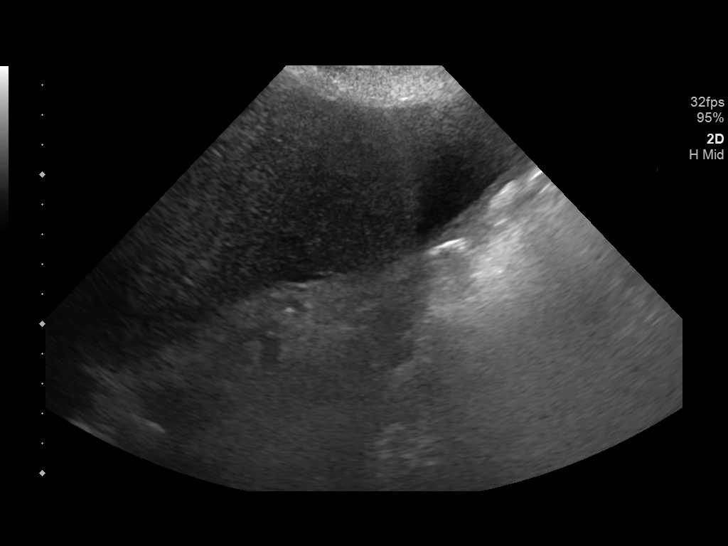
[im 10/55]
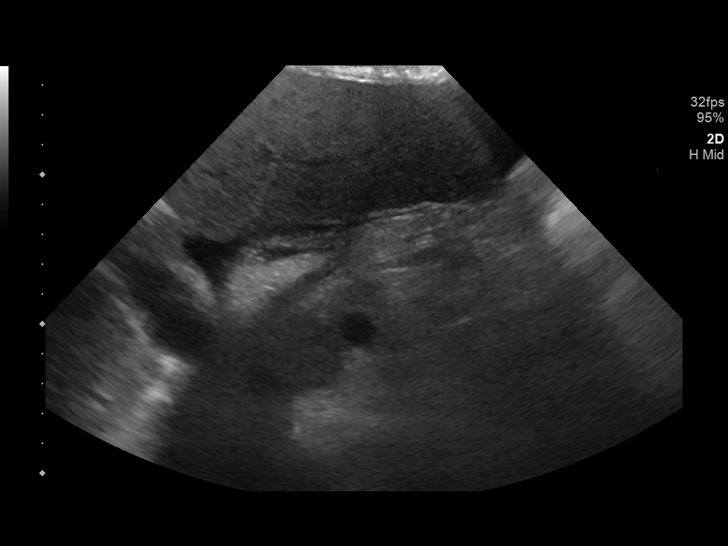
[im 14/55]
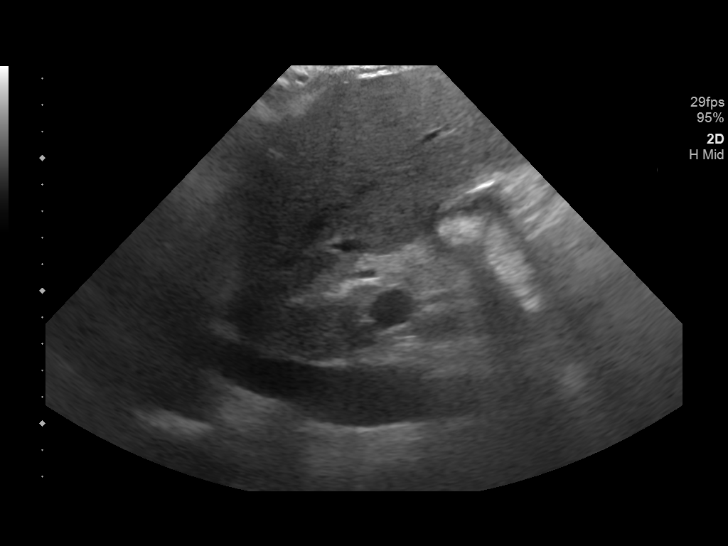
[im 19/55]
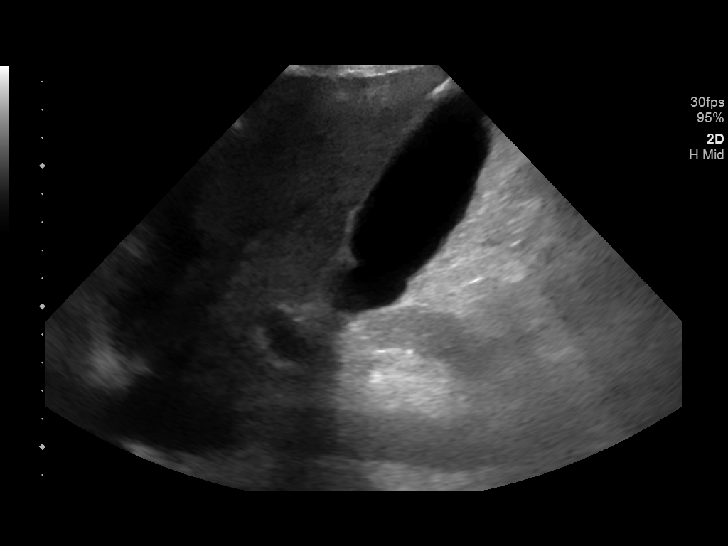
[im 23/55]
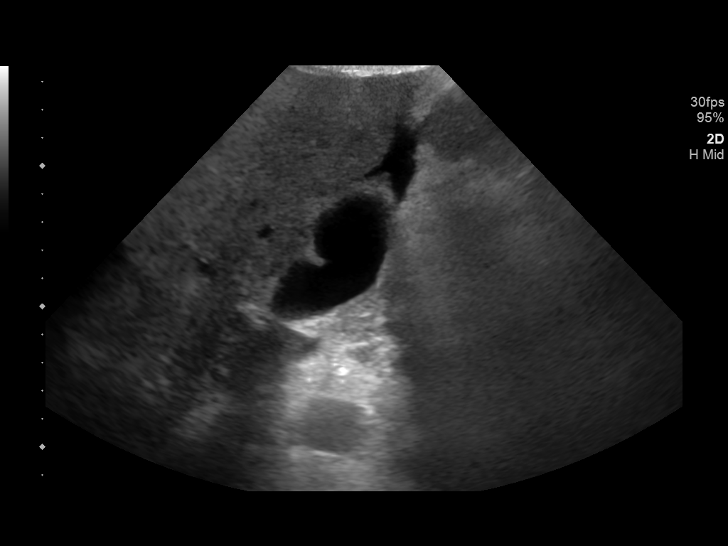
[im 28/55]
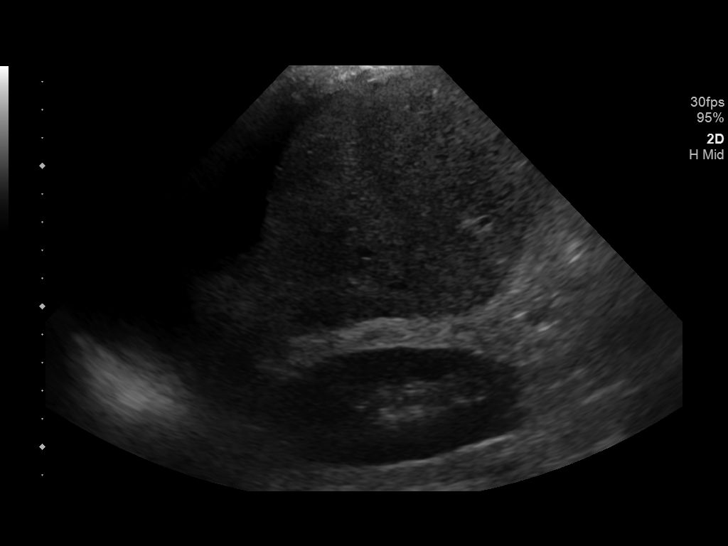
[im 32/55]
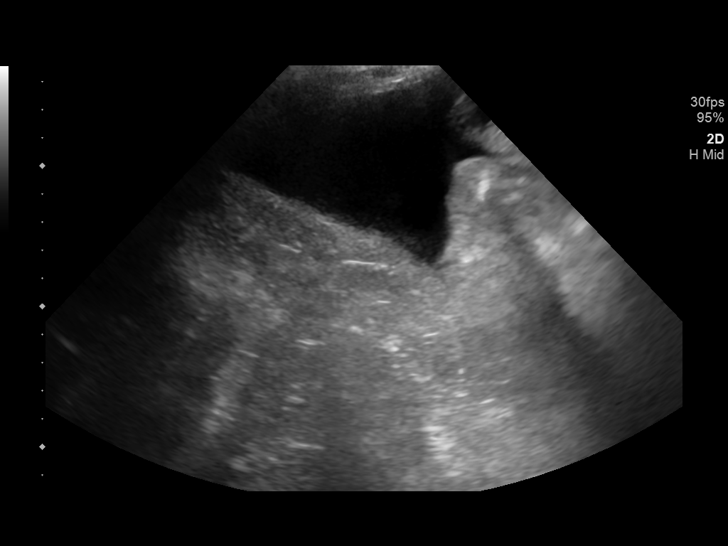
[im 37/55]
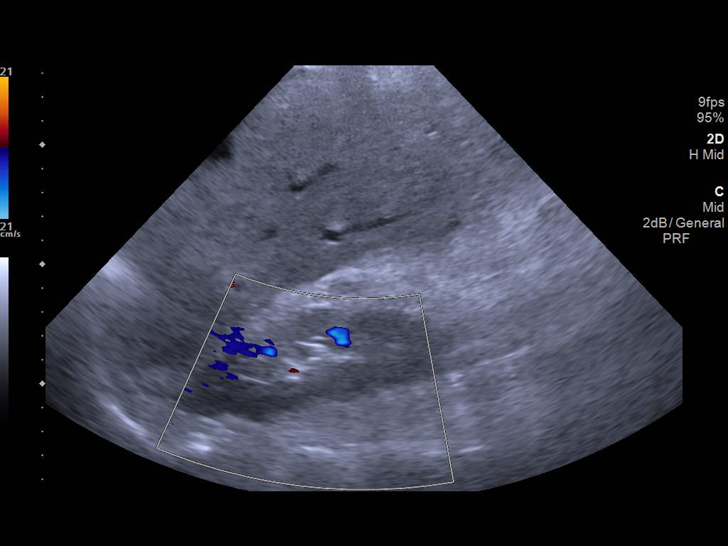
[im 41/55]
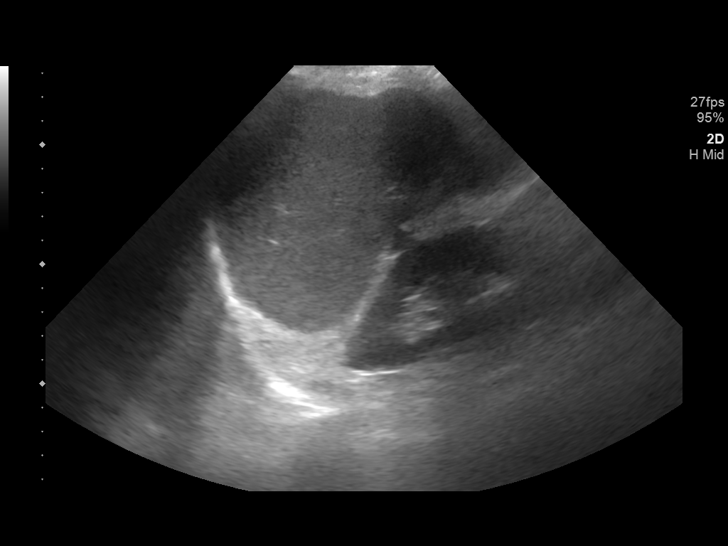
[im 46/55]
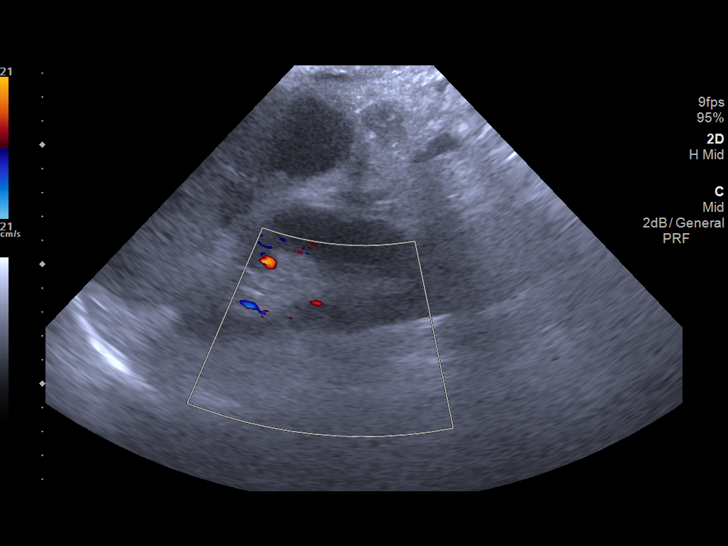
[im 50/55]
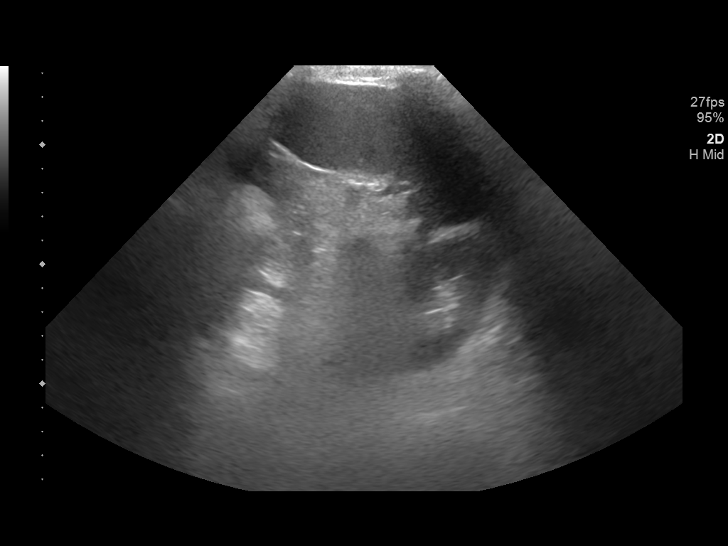
[im 55/55]
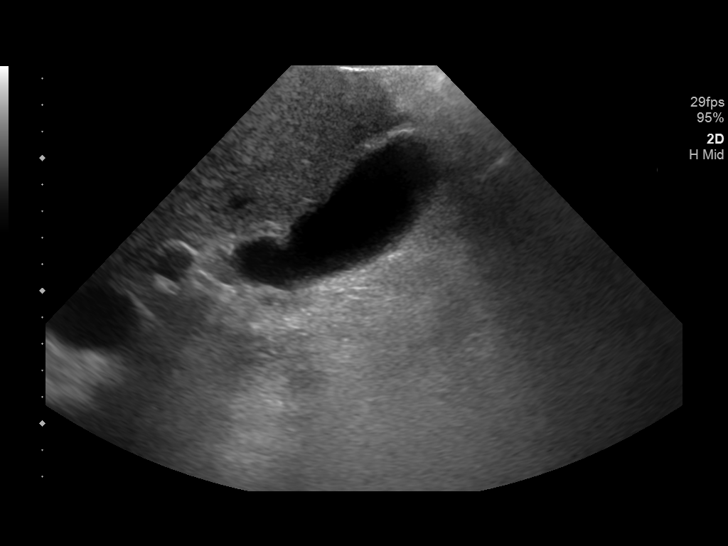

[13 of 25 positions shown; findings below may reference images not displayed]

FINDINGS: Gallbladder: Gallbladder has a normal appearance. Gallbladder wall
is 0.9 millimeters, within normal limits. No stones or
pericholecystic fluid.

Common bile duct: Diameter: 3.4 millimeters

Liver: Heterogeneous echotexture. Nodular surface contour.
Perihepatic ascites. No focal liver lesions are identified. Portal
vein is patent on color Doppler imaging with normal direction of
blood flow towards the liver.

IVC: No abnormality visualized.

Pancreas: Mildly heterogeneous without focal mass.

Spleen: Somewhat nodular and enlarged.  No focal splenic lesion.

Right Kidney: Length: 11.5 centimeters. Echogenicity within normal
limits. No mass or hydronephrosis visualized.

Left Kidney: Length: 11.7 centimeter. Echogenicity within normal
limits. No mass or hydronephrosis visualized.

Abdominal aorta: Visualized portion is not aneurysmal,
centimeters. Distal aorta is obscured by bowel gas.

Other findings: Ascites.
IMPRESSION: 1. Ascites.
2. Cirrhotic morphology of the liver. No focal liver lesions
identified sonographically.
3. Splenomegaly.
4. Positive sonographic Murphy sign without other ultrasound
findings to suggest acute cholecystitis. Findings raise a question
of acalculus cholecystitis.

## 2021-01-26 IMAGING — CR DG CHEST 2V
2 series · 2 of 2 positions shown · non-contrast
Comparison: 03/16/2018

CLINICAL DATA: Found on ground.

EXAM:
CHEST - 2 VIEW

[w chest lat]
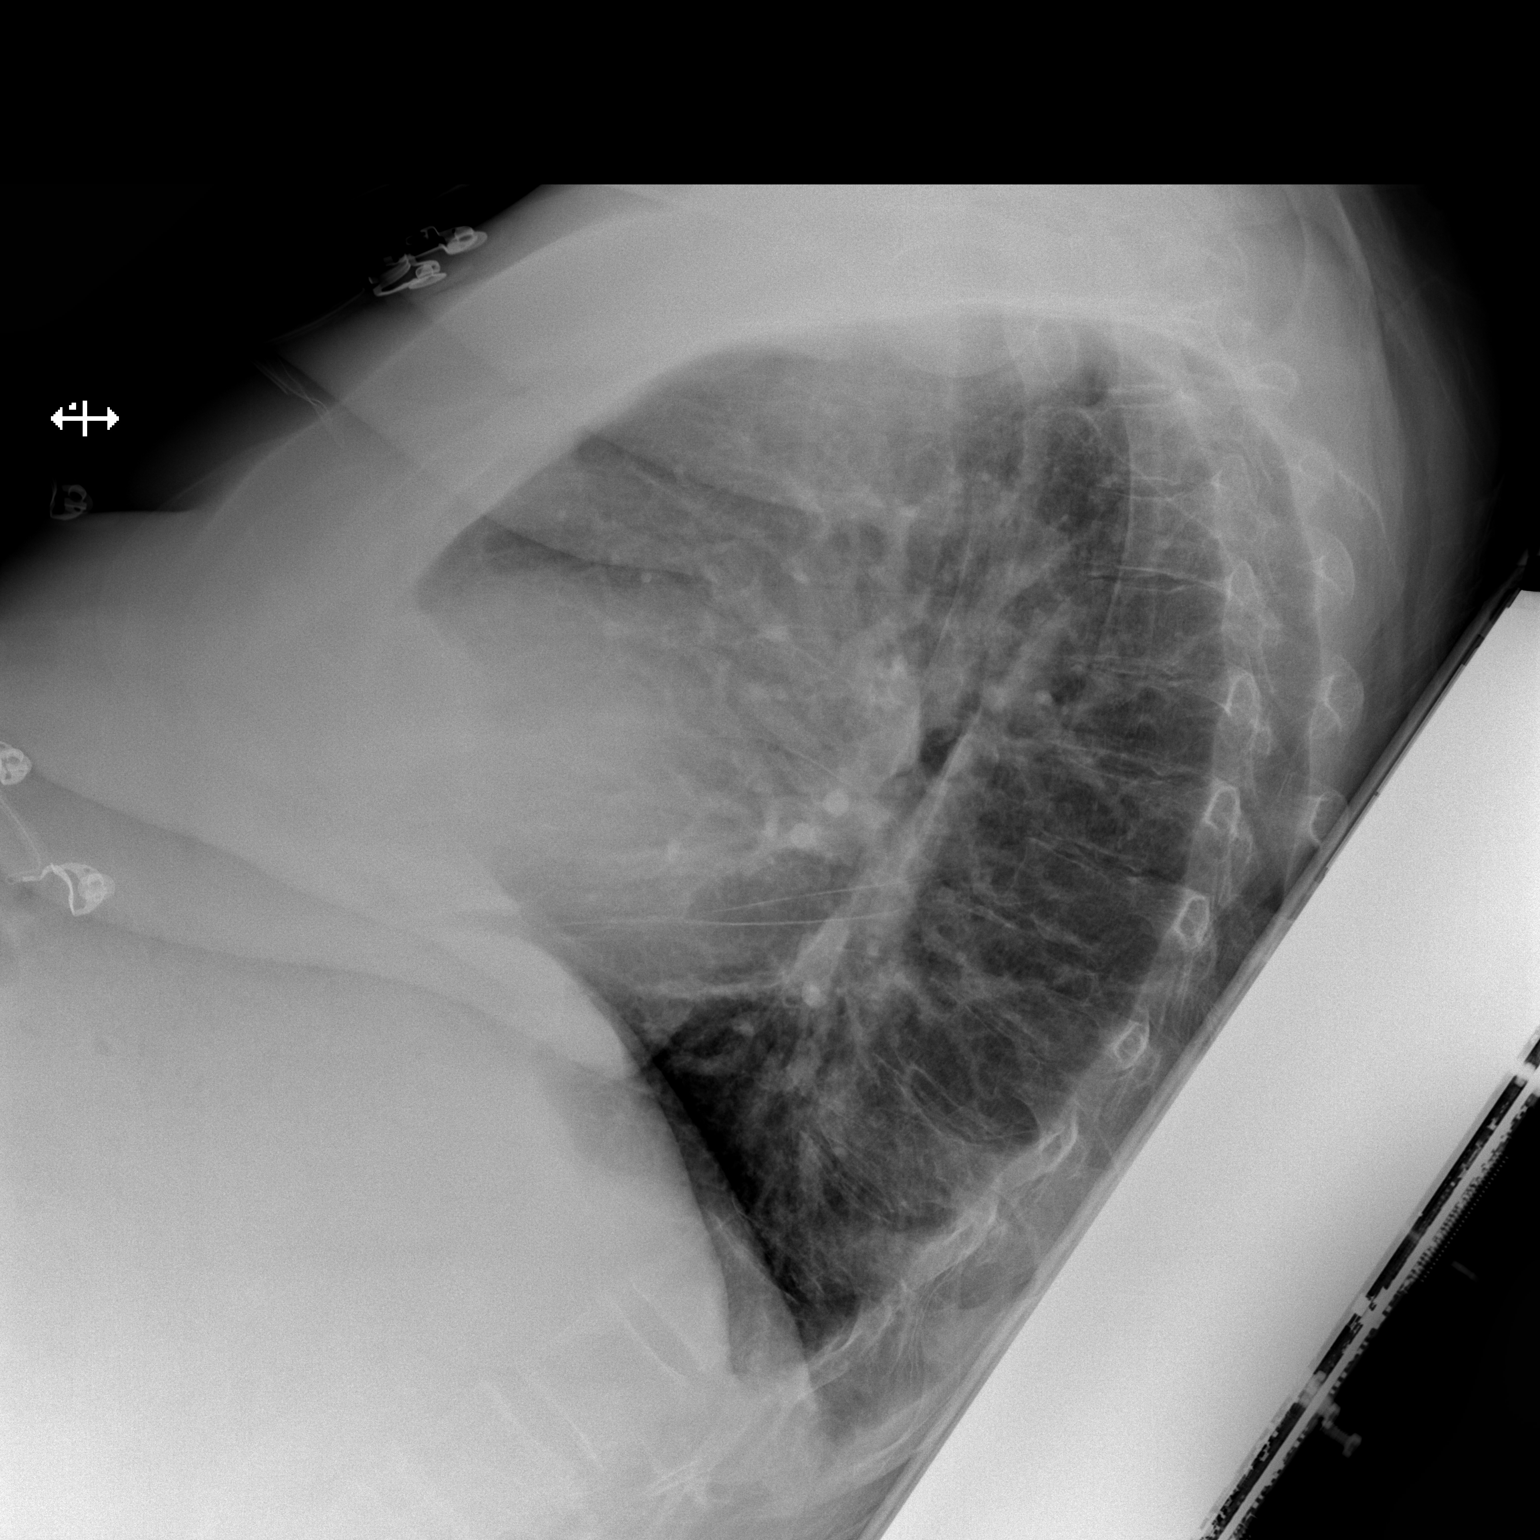

[x chest ap]
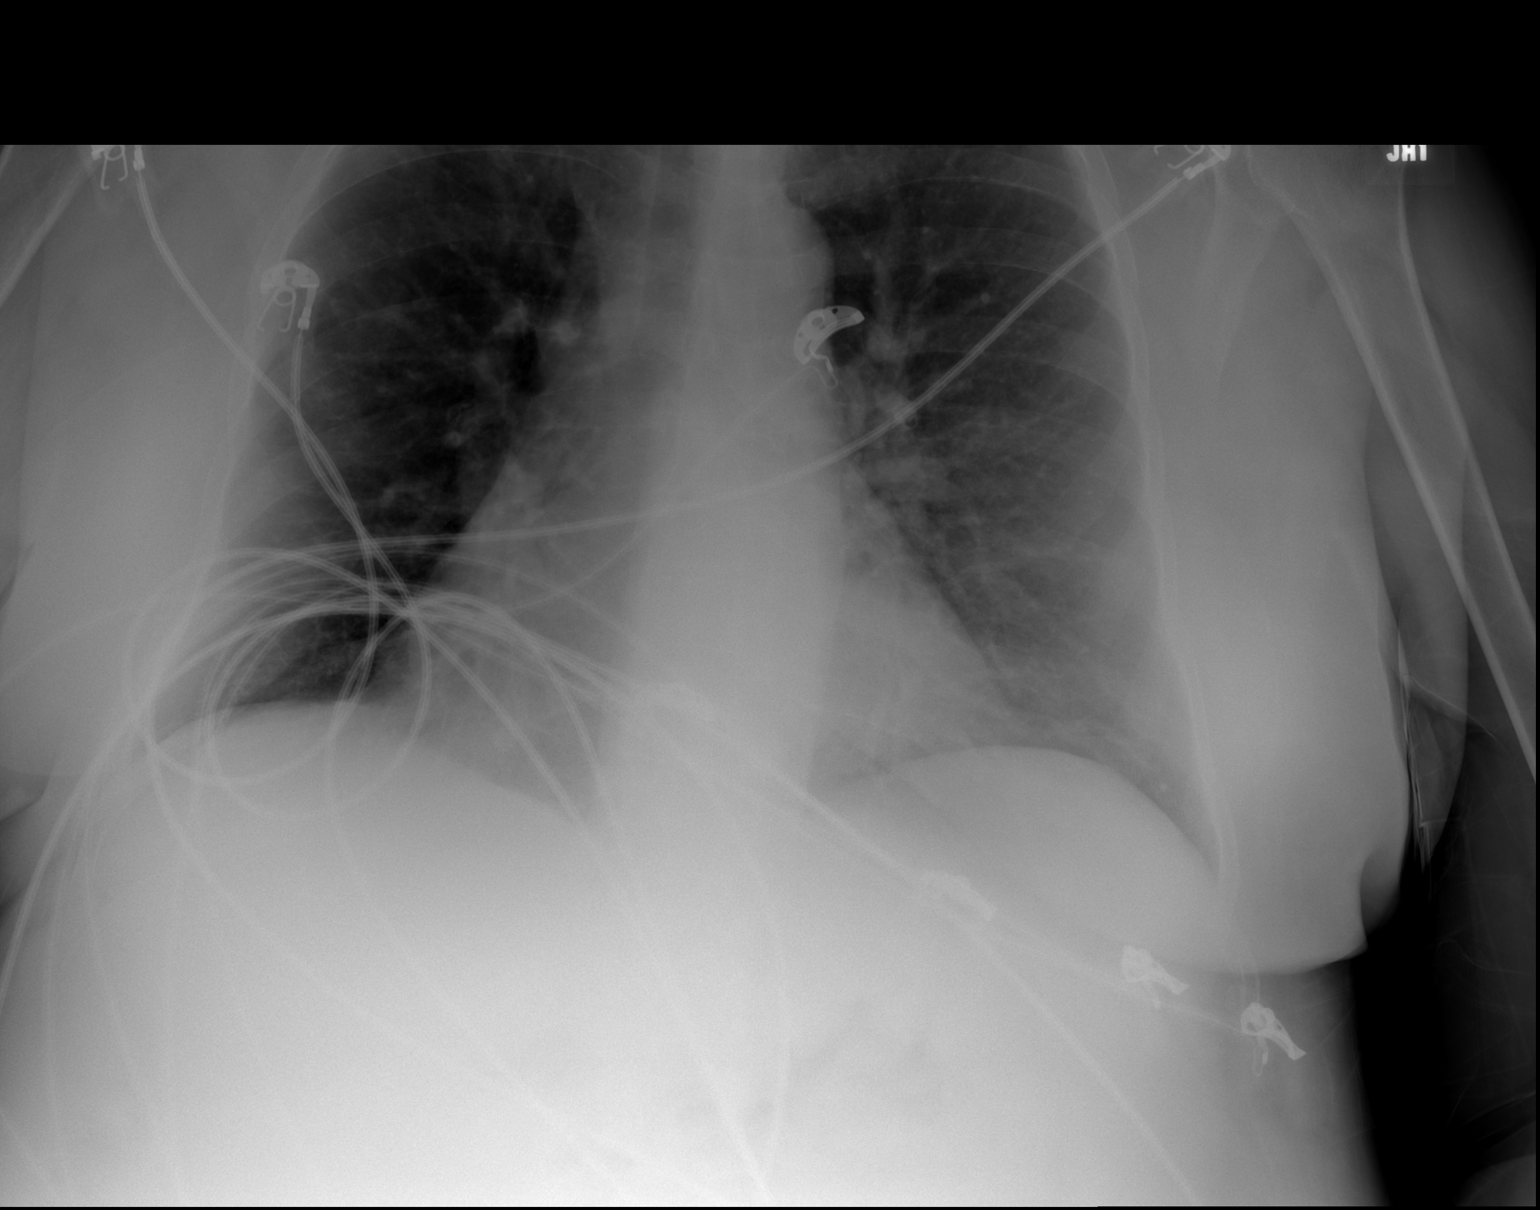

[2 of 2 positions shown; findings below may reference images not displayed]

FINDINGS: The heart is upper limits of normal in size given the AP projection
and portable technique. No change since prior study. The lungs are
clear except for streaky basilar atelectasis. No pleural effusion or
pneumothorax. The bony thorax is intact.
IMPRESSION: Streaky basilar atelectasis but no infiltrates, edema or effusions.

## 2021-01-30 IMAGING — CT CT HEAD W/O CM
4 series · 16 of 47 positions shown, 18 images · non-contrast
Comparison: 03/18/2018

CLINICAL DATA: Altered mental status

EXAM:
CT HEAD WITHOUT CONTRAST
TECHNIQUE: Contiguous axial images were obtained from the base of the skull
through the vertex without intravenous contrast.

[Series 3: head wo · axial · 0.40mm/px · z∈[-166,-46]mm · 7 of 33 slices shown, 9 images]
[im 5/33  brain]
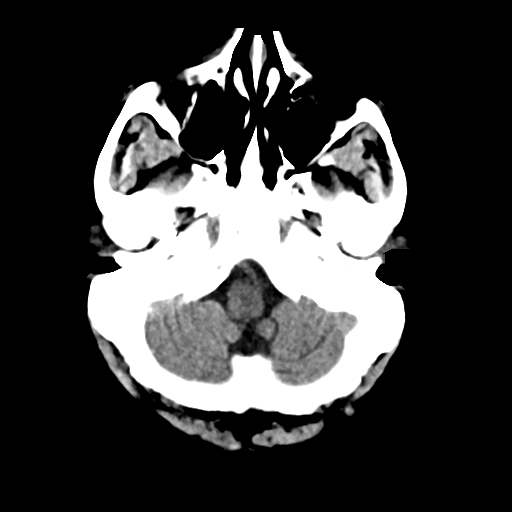
[im 5/33  bone]
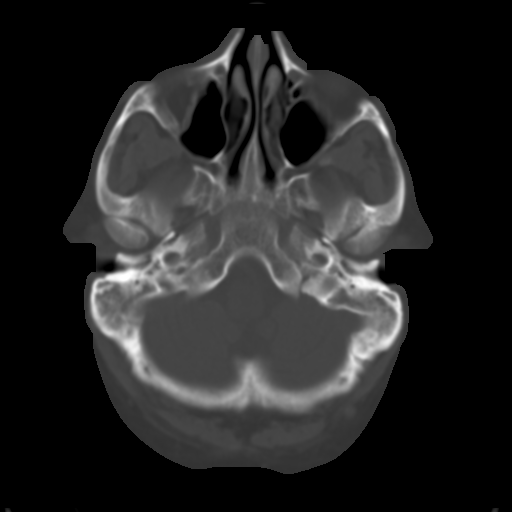
[im 9/33  brain]
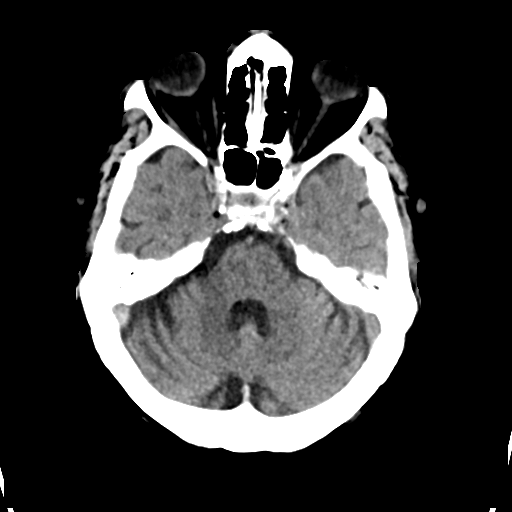
[im 13/33  brain]
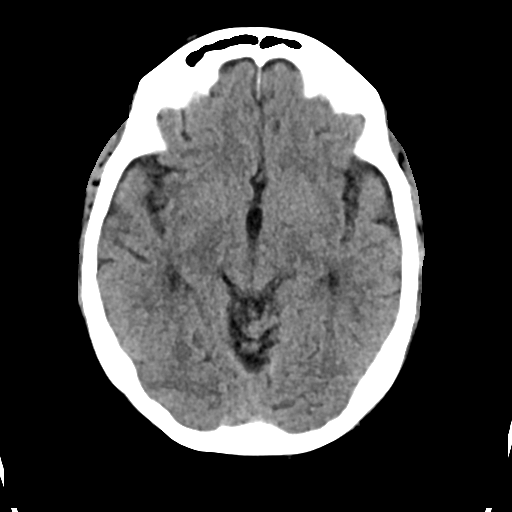
[im 17/33  brain]
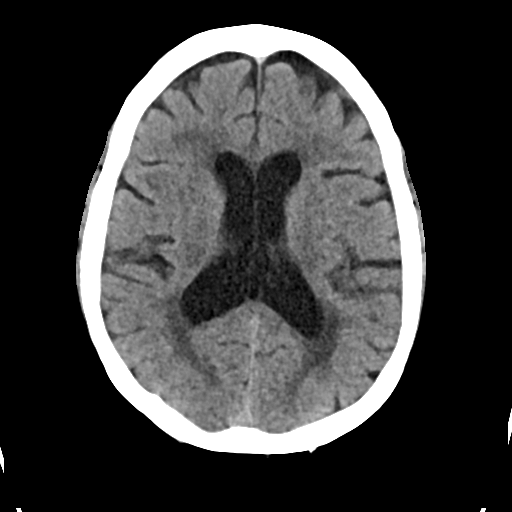
[im 21/33  brain]
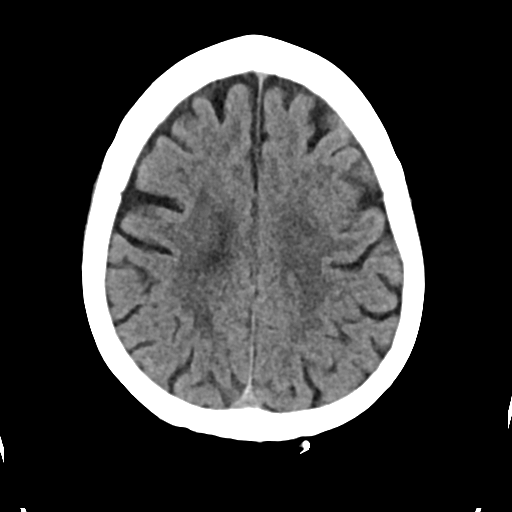
[im 21/33  bone]
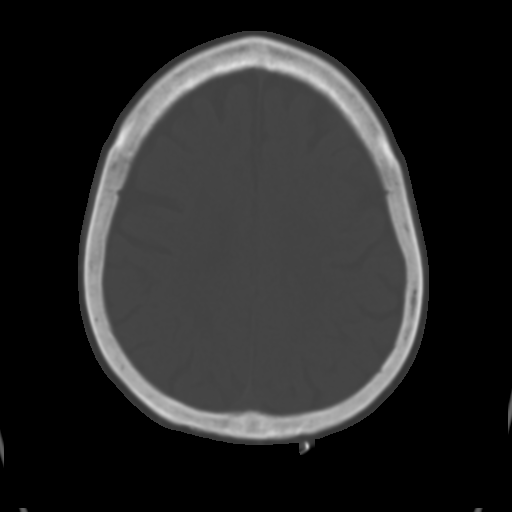
[im 25/33  brain]
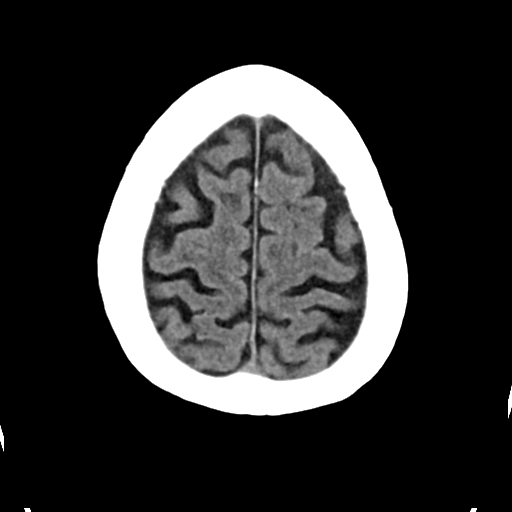
[im 29/33  brain]
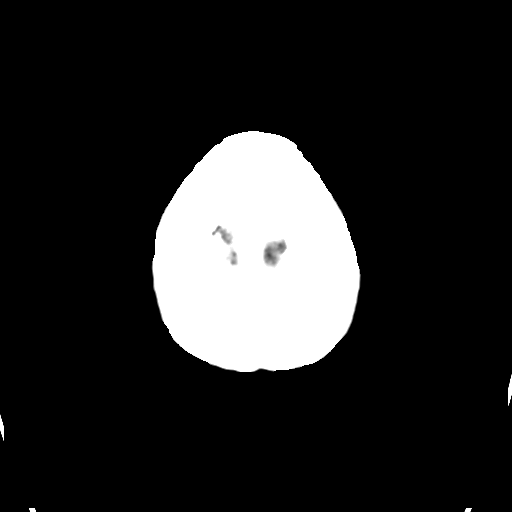

[Series 4: head bone · axial · 0.40mm/px · z∈[-170,-138]mm · 3 of 82 slices shown]
[im 9/82  bone]
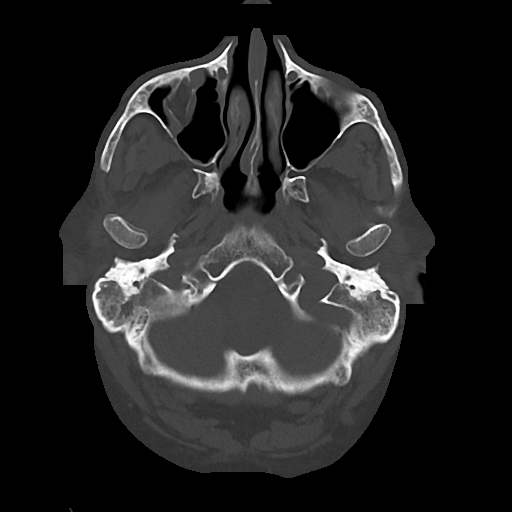
[im 17/82  bone]
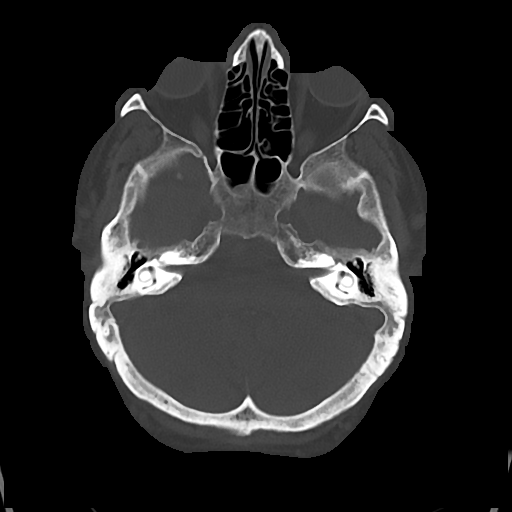
[im 25/82  bone]
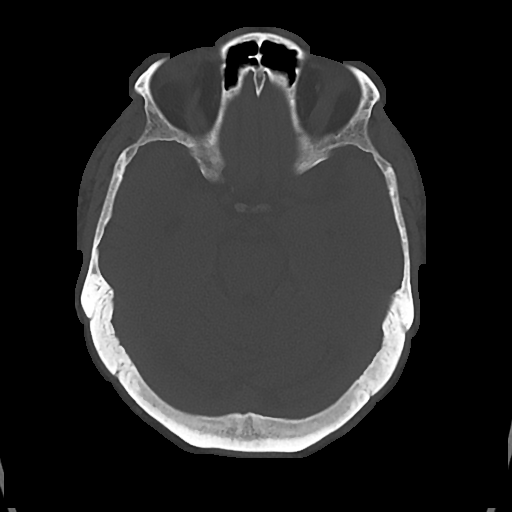

[Series 5: cor soft · coronal · 0.32mm/px · 3 of 67 slices shown]
[im 23/67  brain]
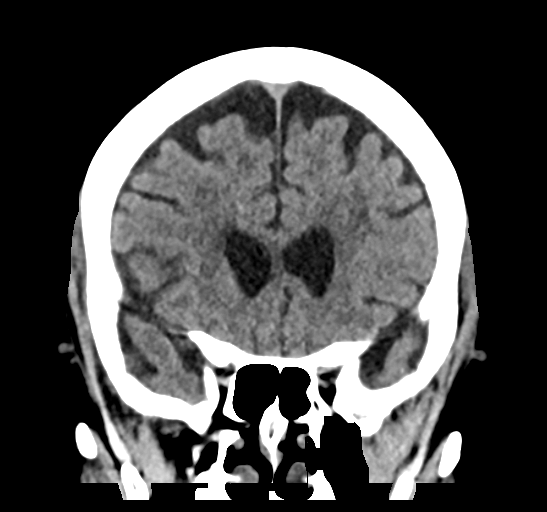
[im 30/67  brain]
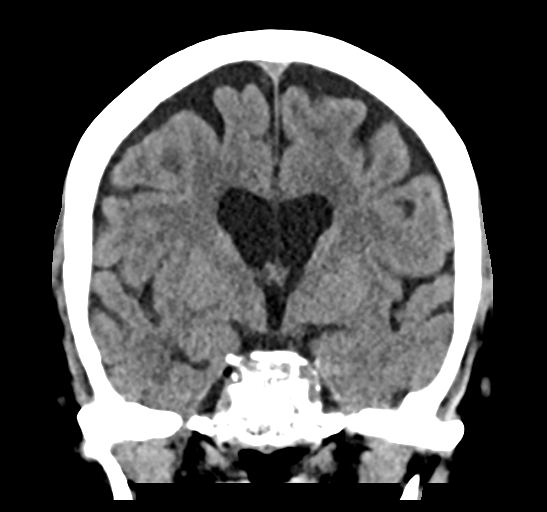
[im 37/67  brain]
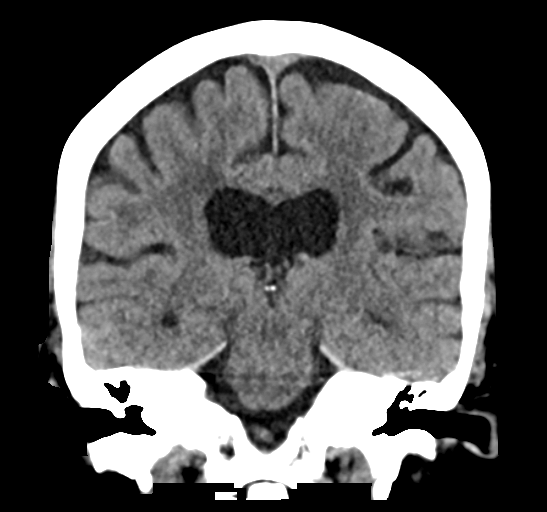

[Series 6: sag soft · sagittal · 0.32mm/px · 3 of 58 slices shown]
[im 20/58  brain]
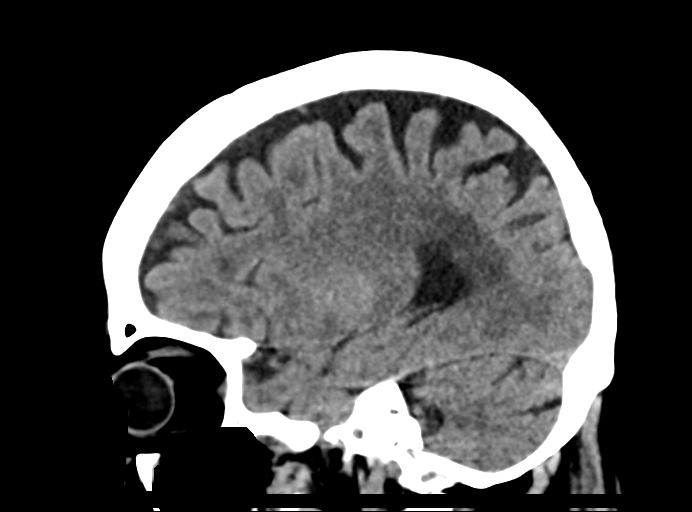
[im 29/58  brain]
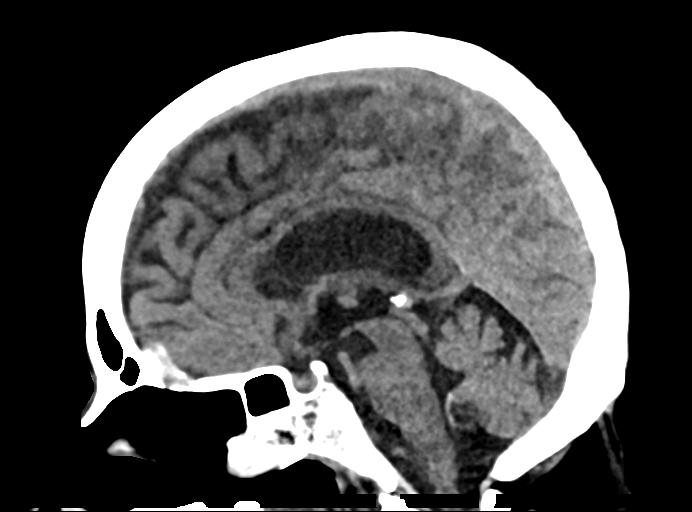
[im 39/58  brain]
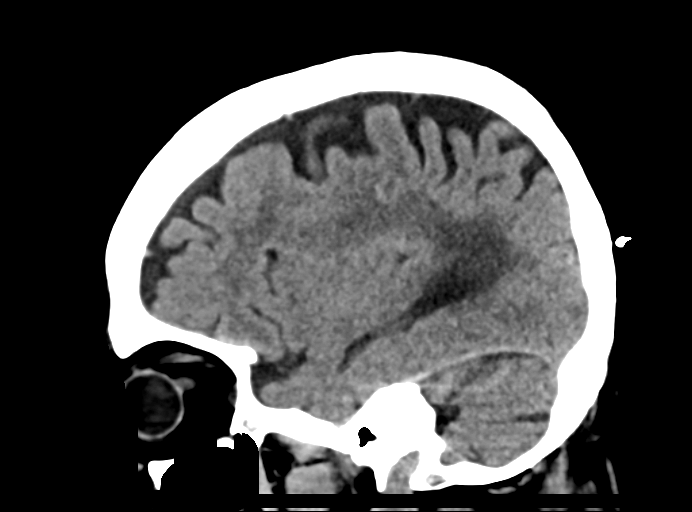

[16 of 47 positions shown; findings below may reference images not displayed]

FINDINGS: Brain: No evidence of acute infarction, hemorrhage, hydrocephalus,
extra-axial collection or mass lesion/mass effect. Periventricular
white matter hypodensity.

Vascular: No hyperdense vessel or unexpected calcification.

Skull: Normal. Negative for fracture or focal lesion.

Sinuses/Orbits: Redemonstrated displaced fracture of the right
orbital floor, better evaluated by recent dedicated CT examination
of the facial bones.

Other: None.
IMPRESSION: 1. No acute intracranial pathology. Small-vessel white matter
disease.

2. Redemonstrated displaced fracture of the right orbital floor,
better evaluated by recent dedicated CT examination of the facial
bones.

## 2021-01-30 IMAGING — DX DG CHEST 1V PORT
1 series · 1 of 1 positions shown · non-contrast
Comparison: 03/19/2018

CLINICAL DATA: Patient fell last evening. Bruising of the right
eye. Dyspnea.

EXAM:
PORTABLE CHEST 1 VIEW

[chest]
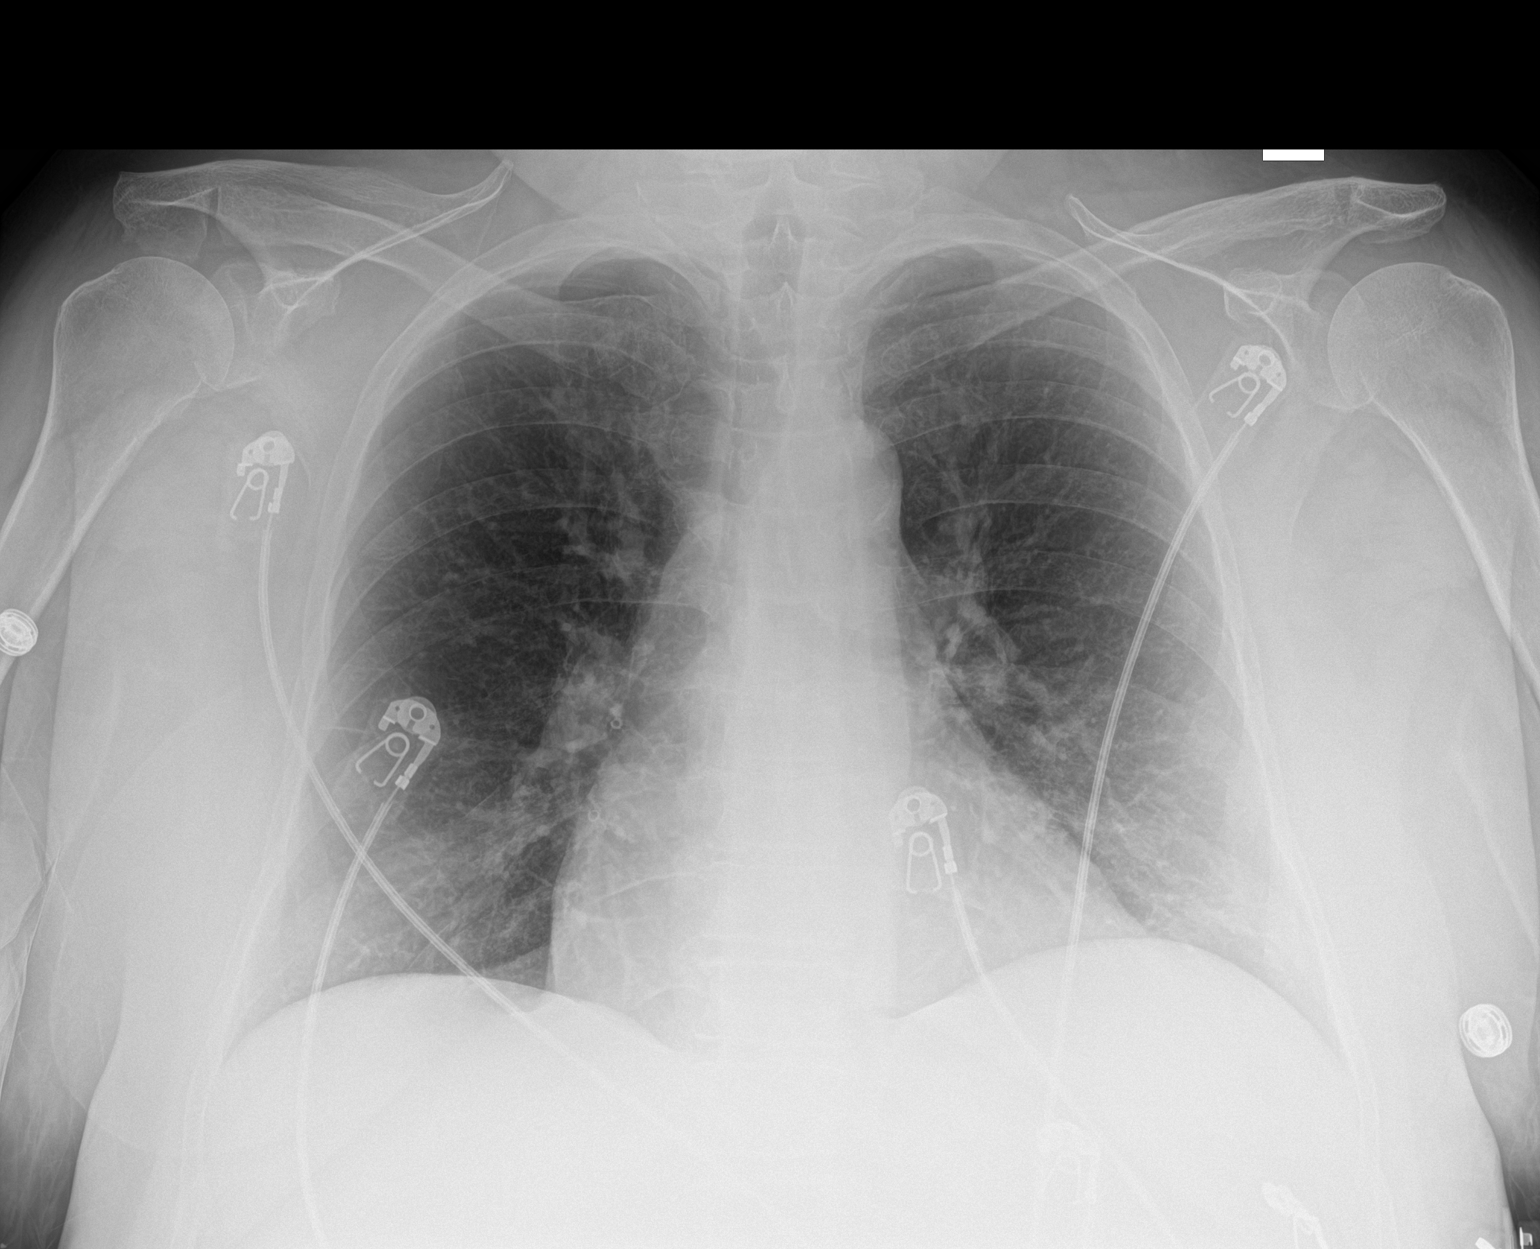

[1 of 1 positions shown; findings below may reference images not displayed]

FINDINGS: The cardiopericardial silhouette is mildly enlarged but stable. Mild
aortic atherosclerosis is noted at the arch without aneurysm.
Chronic bronchitic change of the lungs without alveolar
consolidation effusion or pneumothorax.
IMPRESSION: Chronic bronchitic change of the lungs. Mild cardiomegaly with
aortic atherosclerosis.

## 2021-02-16 IMAGING — DX DG CHEST 1V PORT
1 series · 1 of 1 positions shown · non-contrast
Comparison: Prior radiograph from 03/23/2018

CLINICAL DATA: Initial evaluation for COPD.

EXAM:
PORTABLE CHEST 1 VIEW

[chest ap]
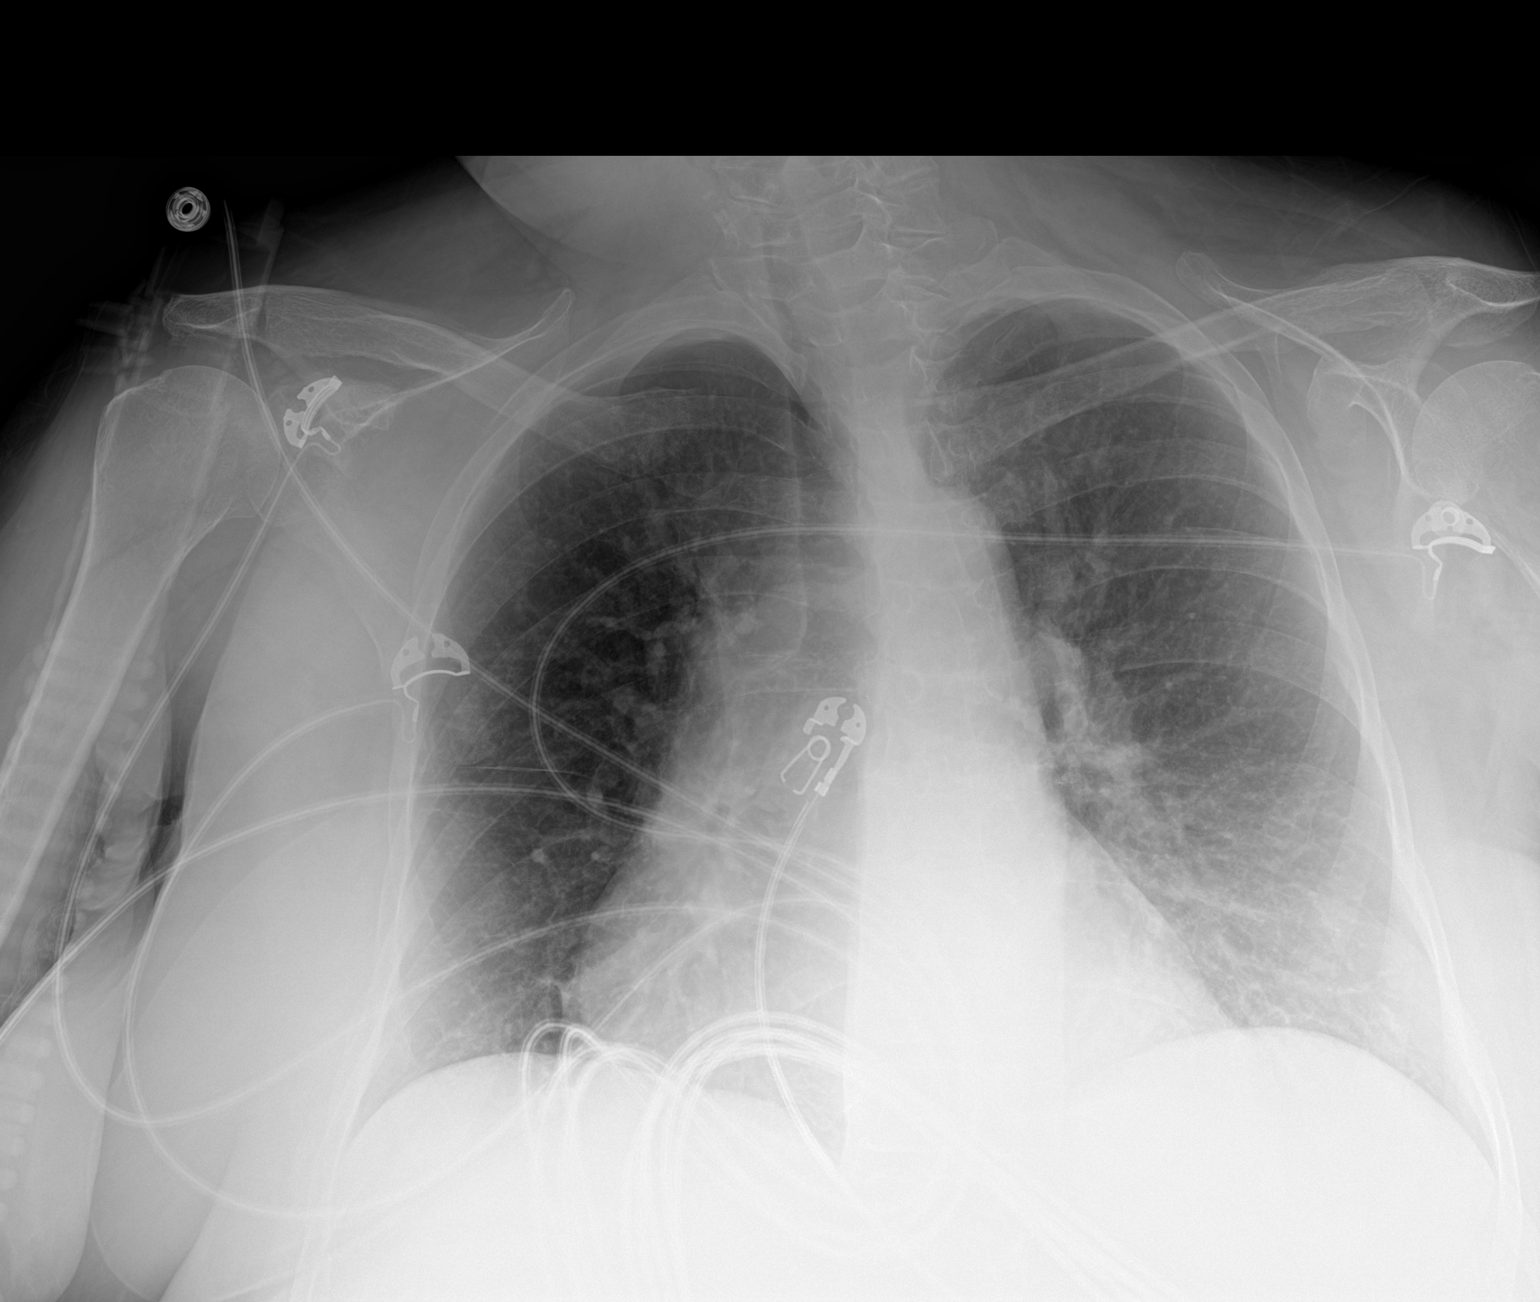

[1 of 1 positions shown; findings below may reference images not displayed]

FINDINGS: Patient is markedly rotated to the right. Cardiomegaly grossly
stable. Mediastinal silhouette within normal limits. Aortic
atherosclerosis.

Lungs hypoinflated. Underlying changes related to COPD. No focal
infiltrates. No superimposed edema or pleural effusion. No
pneumothorax.

No acute osseous finding.
IMPRESSION: 1. COPD.  No other superimposed active cardiopulmonary disease.
2. Stable mild cardiomegaly with aortic atherosclerosis.

## 2021-02-16 IMAGING — DX DG KNEE 1-2V PORT*L*
4 series · 4 of 4 positions shown · non-contrast
Comparison: None.

CLINICAL DATA: 49-year-old female with left knee pain.

EXAM:
PORTABLE LEFT KNEE - 1-2 VIEW

[knee ap]
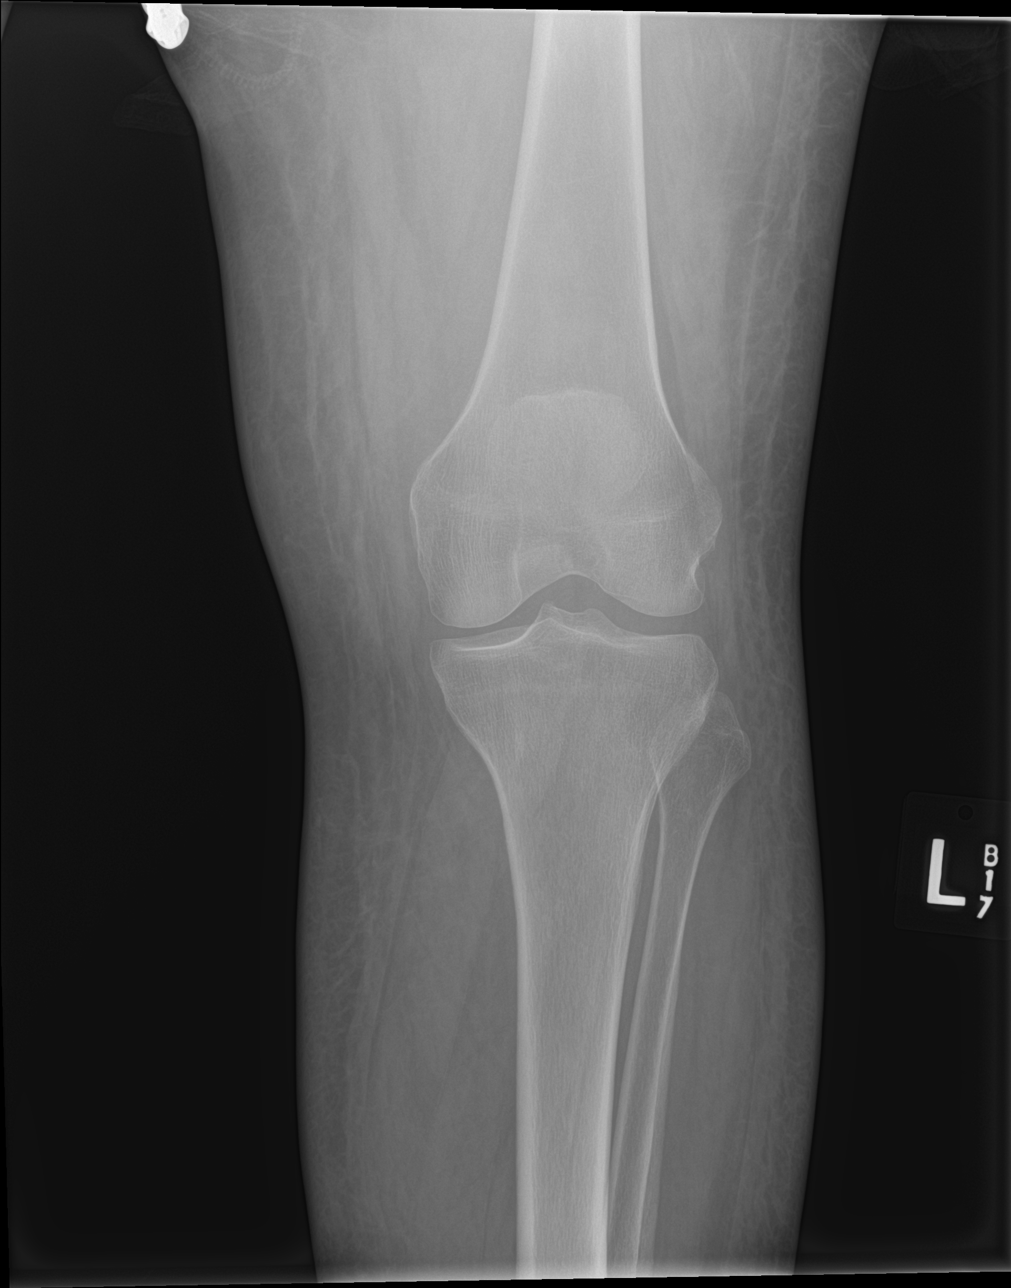

[knee lat]
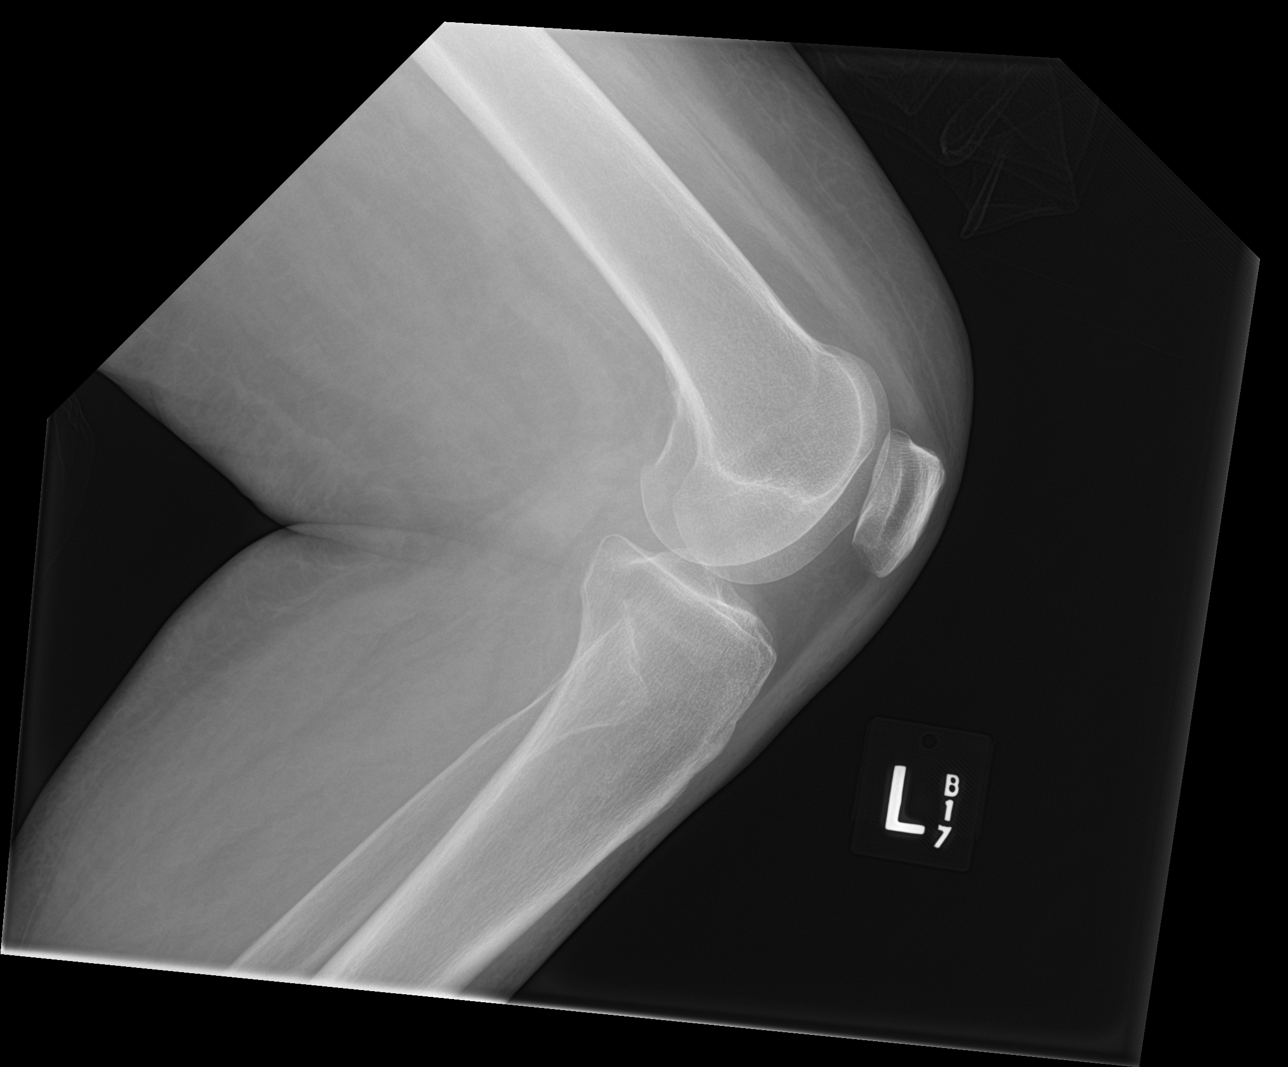

[knee obl (1 of 2)]
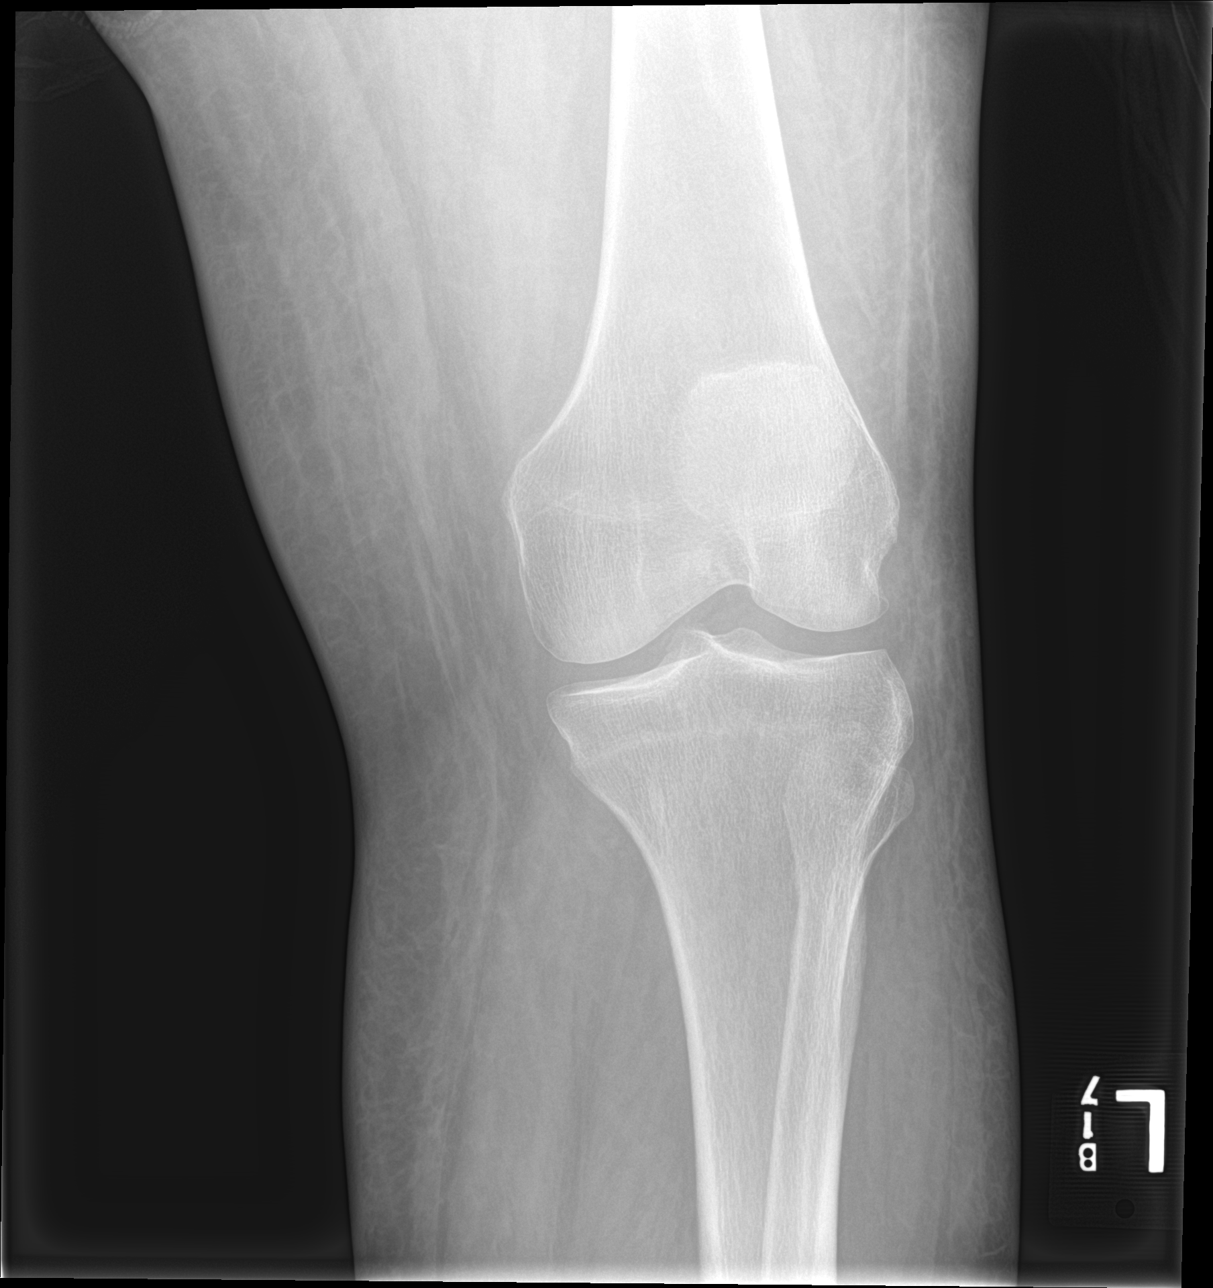

[knee obl (2 of 2)]
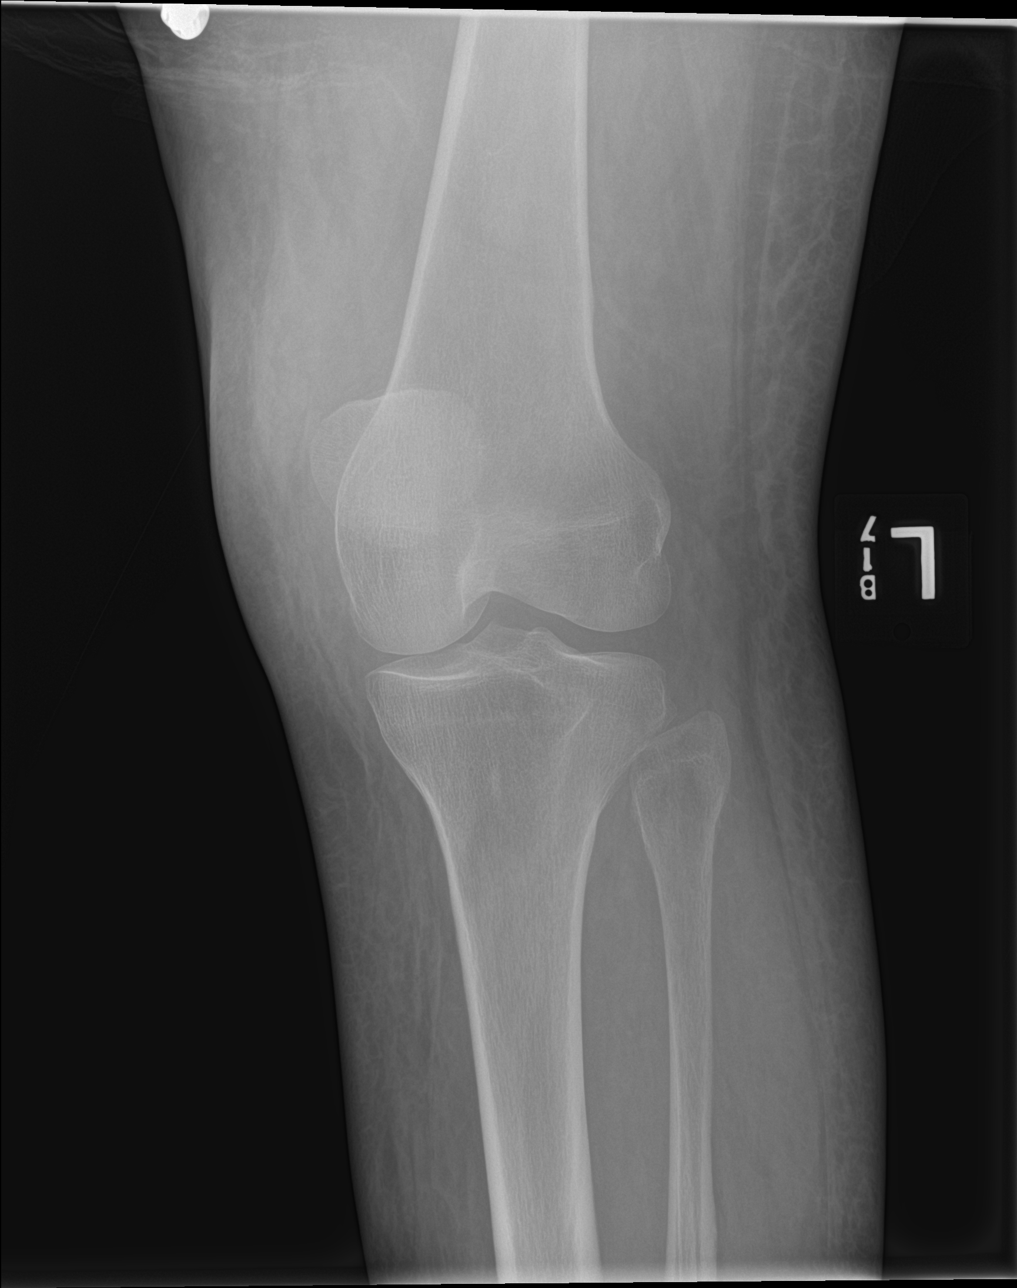

[4 of 4 positions shown; findings below may reference images not displayed]

FINDINGS: There is no acute fracture or dislocation. Mild osteopenia. No joint
effusion. There is diffuse subcutaneous soft tissue edema.
IMPRESSION: No acute fracture or dislocation.

## 2021-02-18 IMAGING — CT CT ANGIO CHEST
2 of 7 series · 18 of 46 positions shown · IV contrast (OMNIPAQUE)
Comparison: 02/28/2018

CLINICAL DATA: Difficulty breathing

EXAM:
CT ANGIOGRAPHY CHEST WITH CONTRAST
TECHNIQUE: Multidetector CT imaging of the chest was performed using the
standard protocol during bolus administration of intravenous
contrast. Multiplanar CT image reconstructions and MIPs were
obtained to evaluate the vascular anatomy.
CONTRAST:  100mL OMNIPAQUE IOHEXOL 350 MG/ML SOLN

[Series 5: thins · axial · 0.74mm/px · z∈[-592,-324]mm · 15 of 304 slices shown]
[im 18/304  lung]
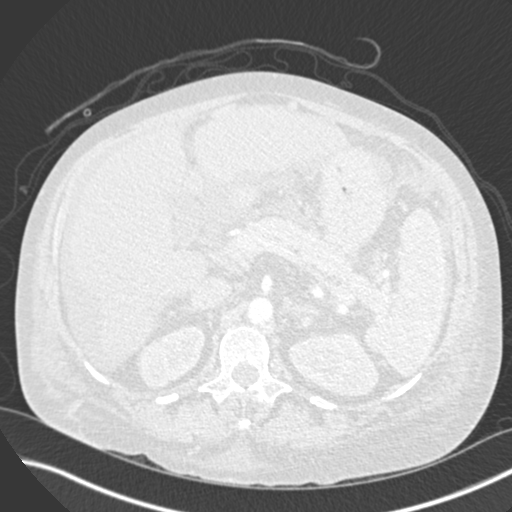
[im 36/304  soft-tissue]
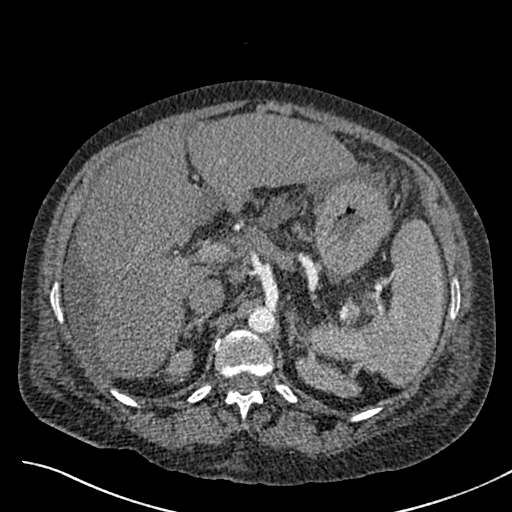
[im 54/304  lung]
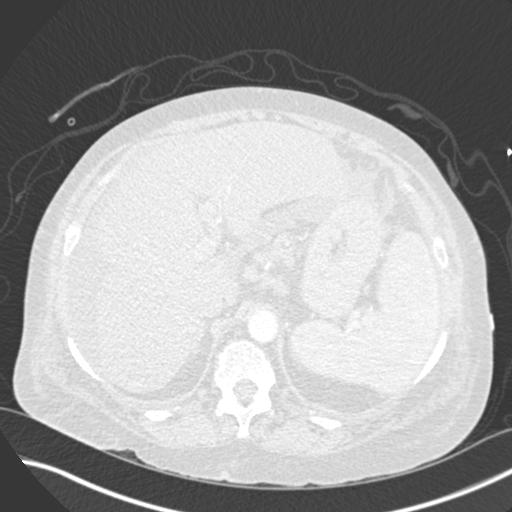
[im 72/304  soft-tissue]
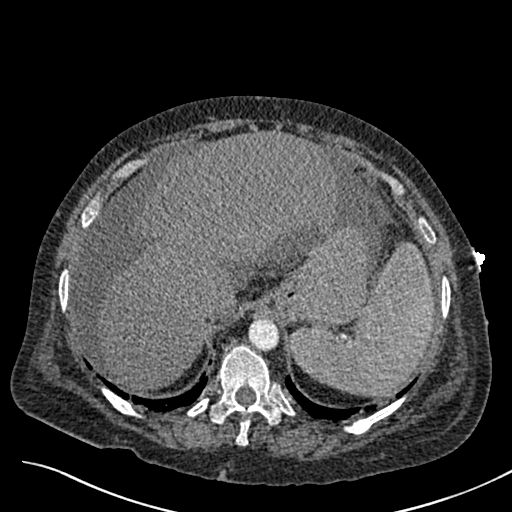
[im 90/304  lung]
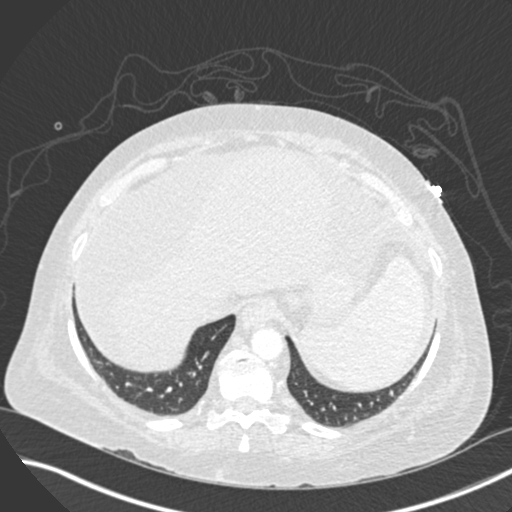
[im 107/304  soft-tissue]
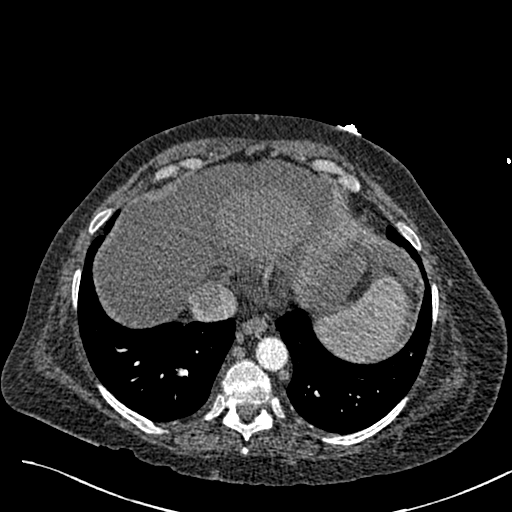
[im 125/304  lung]
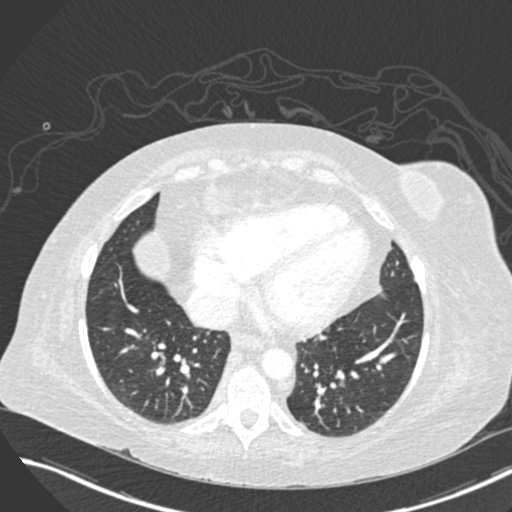
[im 161/304  soft-tissue]
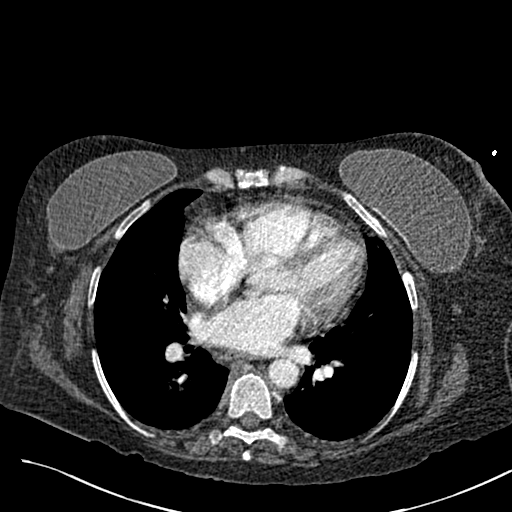
[im 179/304  lung]
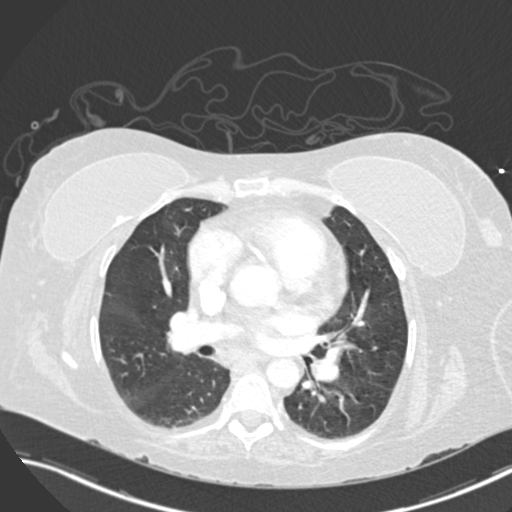
[im 197/304  soft-tissue]
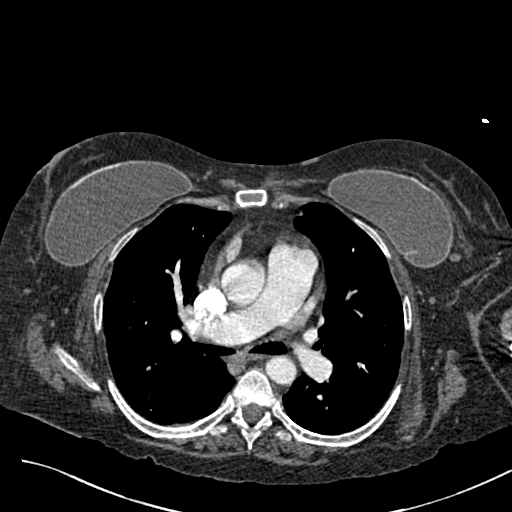
[im 214/304  lung]
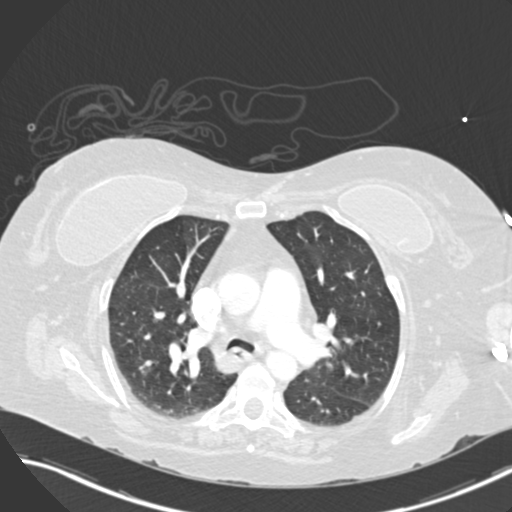
[im 232/304  soft-tissue]
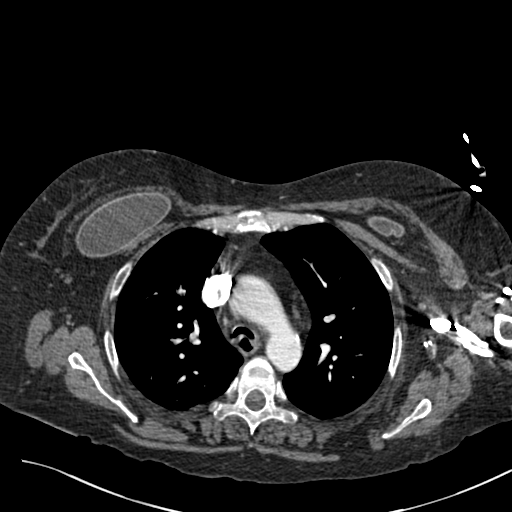
[im 250/304  lung]
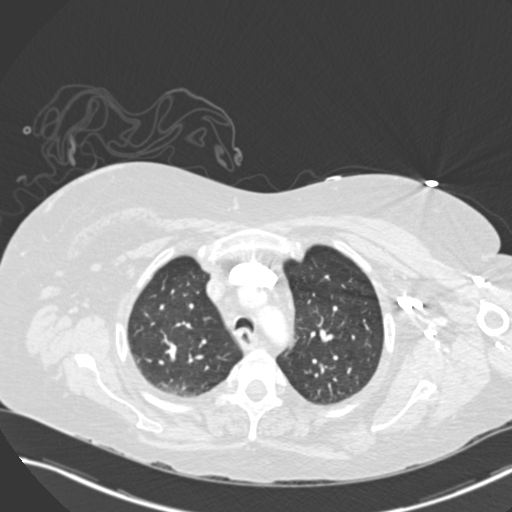
[im 268/304  soft-tissue]
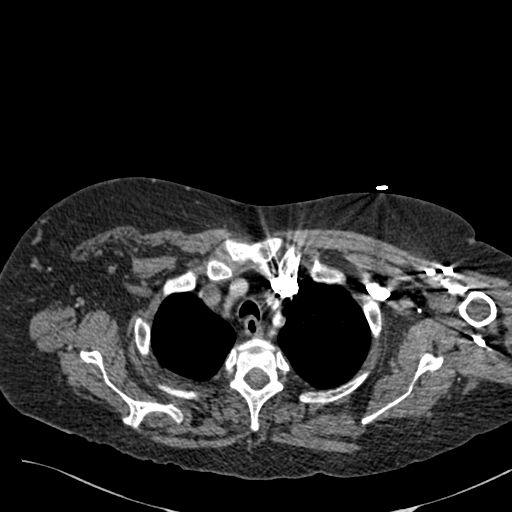
[im 286/304  lung]
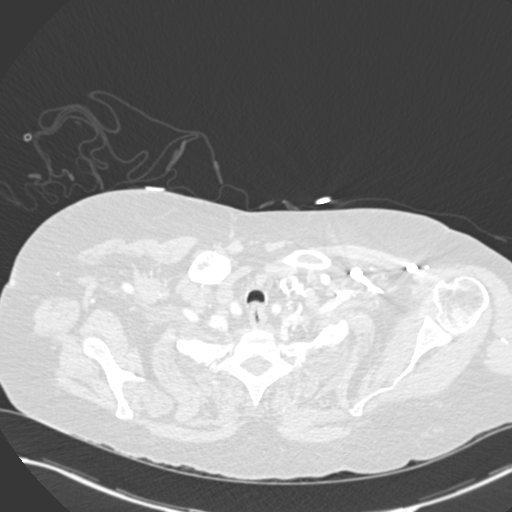

[Series 6: coronal mpr · coronal · 0.63mm/px · 3 of 139 slices shown]
[im 35/139  soft-tissue]
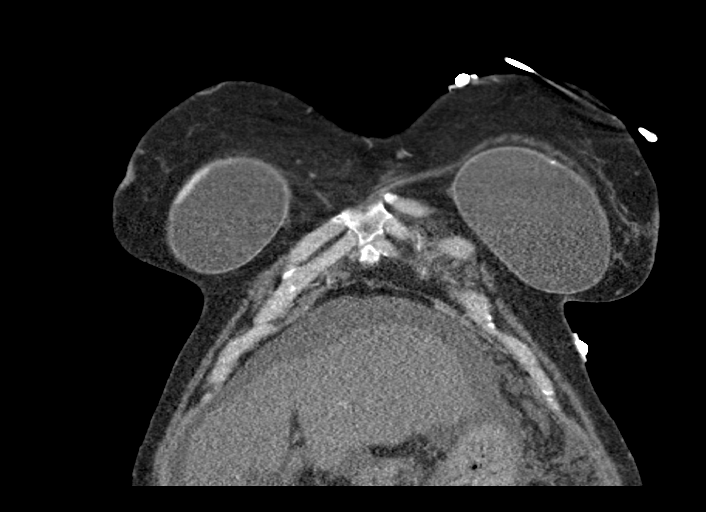
[im 70/139  soft-tissue]
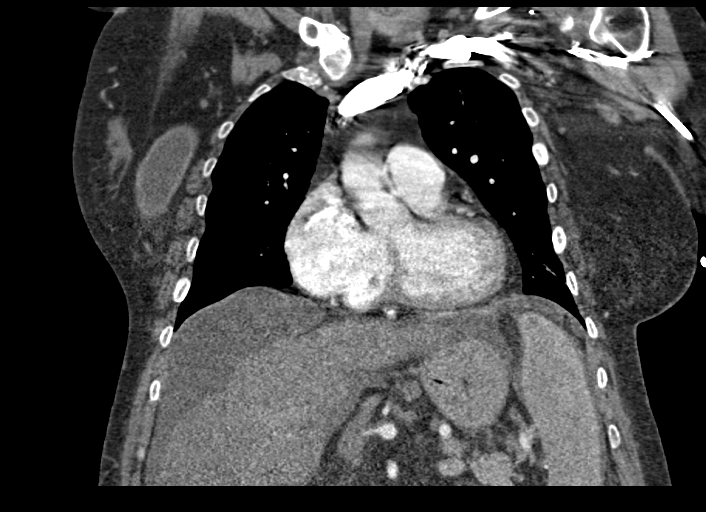
[im 104/139  soft-tissue]
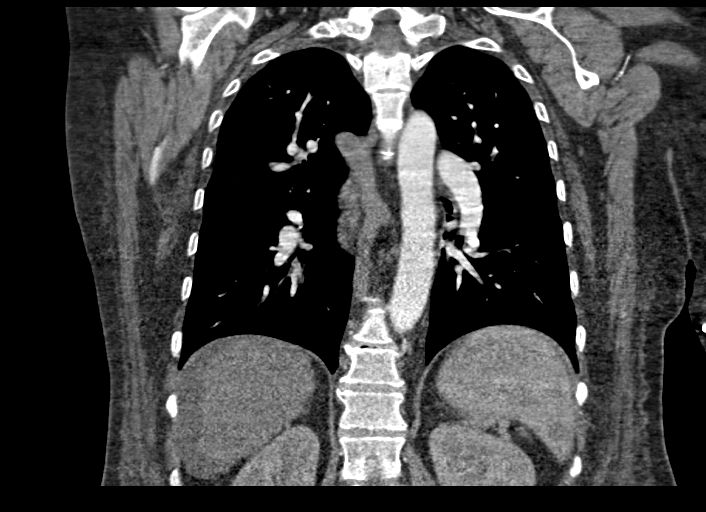

[18 of 46 positions shown; findings below may reference images not displayed]

FINDINGS: Cardiovascular: Thoracic aorta demonstrates a normal branching
pattern. No aneurysmal dilatation or dissection is seen. Mild
cardiac enlargement is noted. Pulmonary artery shows a normal
branching pattern without intraluminal filling defect to suggest
pulmonary embolism.

Mediastinum/Nodes: Thoracic inlet is within normal limits. No
sizable hilar or mediastinal adenopathy is noted. The esophagus as
visualized is within normal limits.

Lungs/Pleura: The lungs are well aerated bilaterally. No focal
infiltrate or sizable effusion is seen. No parenchymal nodules are
noted.

Upper Abdomen: Cirrhotic change of the liver is noted with evidence
of perihepatic ascites slightly increased when compared with the
prior exam. Previously seen varices are again identified but not
opacified due to the timing of the contrast bolus. The remainder of
the upper abdomen is within normal limits.

Musculoskeletal: Bilateral breast implants are noted. Degenerative
changes of the thoracic spine are seen. Healed rib fractures are
noted bilaterally.

Review of the MIP images confirms the above findings.
IMPRESSION: No evidence of pulmonary emboli.

Cirrhotic change of the liver with slight increase in ascites when
compared with the prior exam.

## 2021-02-18 IMAGING — DX DG CHEST 2V
2 series · 2 of 2 positions shown · non-contrast
Comparison: CT the abdomen and pelvis 04/09/2018.

CLINICAL DATA: 57-year-old female with history of alcohol abuse
presenting with abdominal pain and nausea.

EXAM:
CHEST - 2 VIEW

[chest lat]
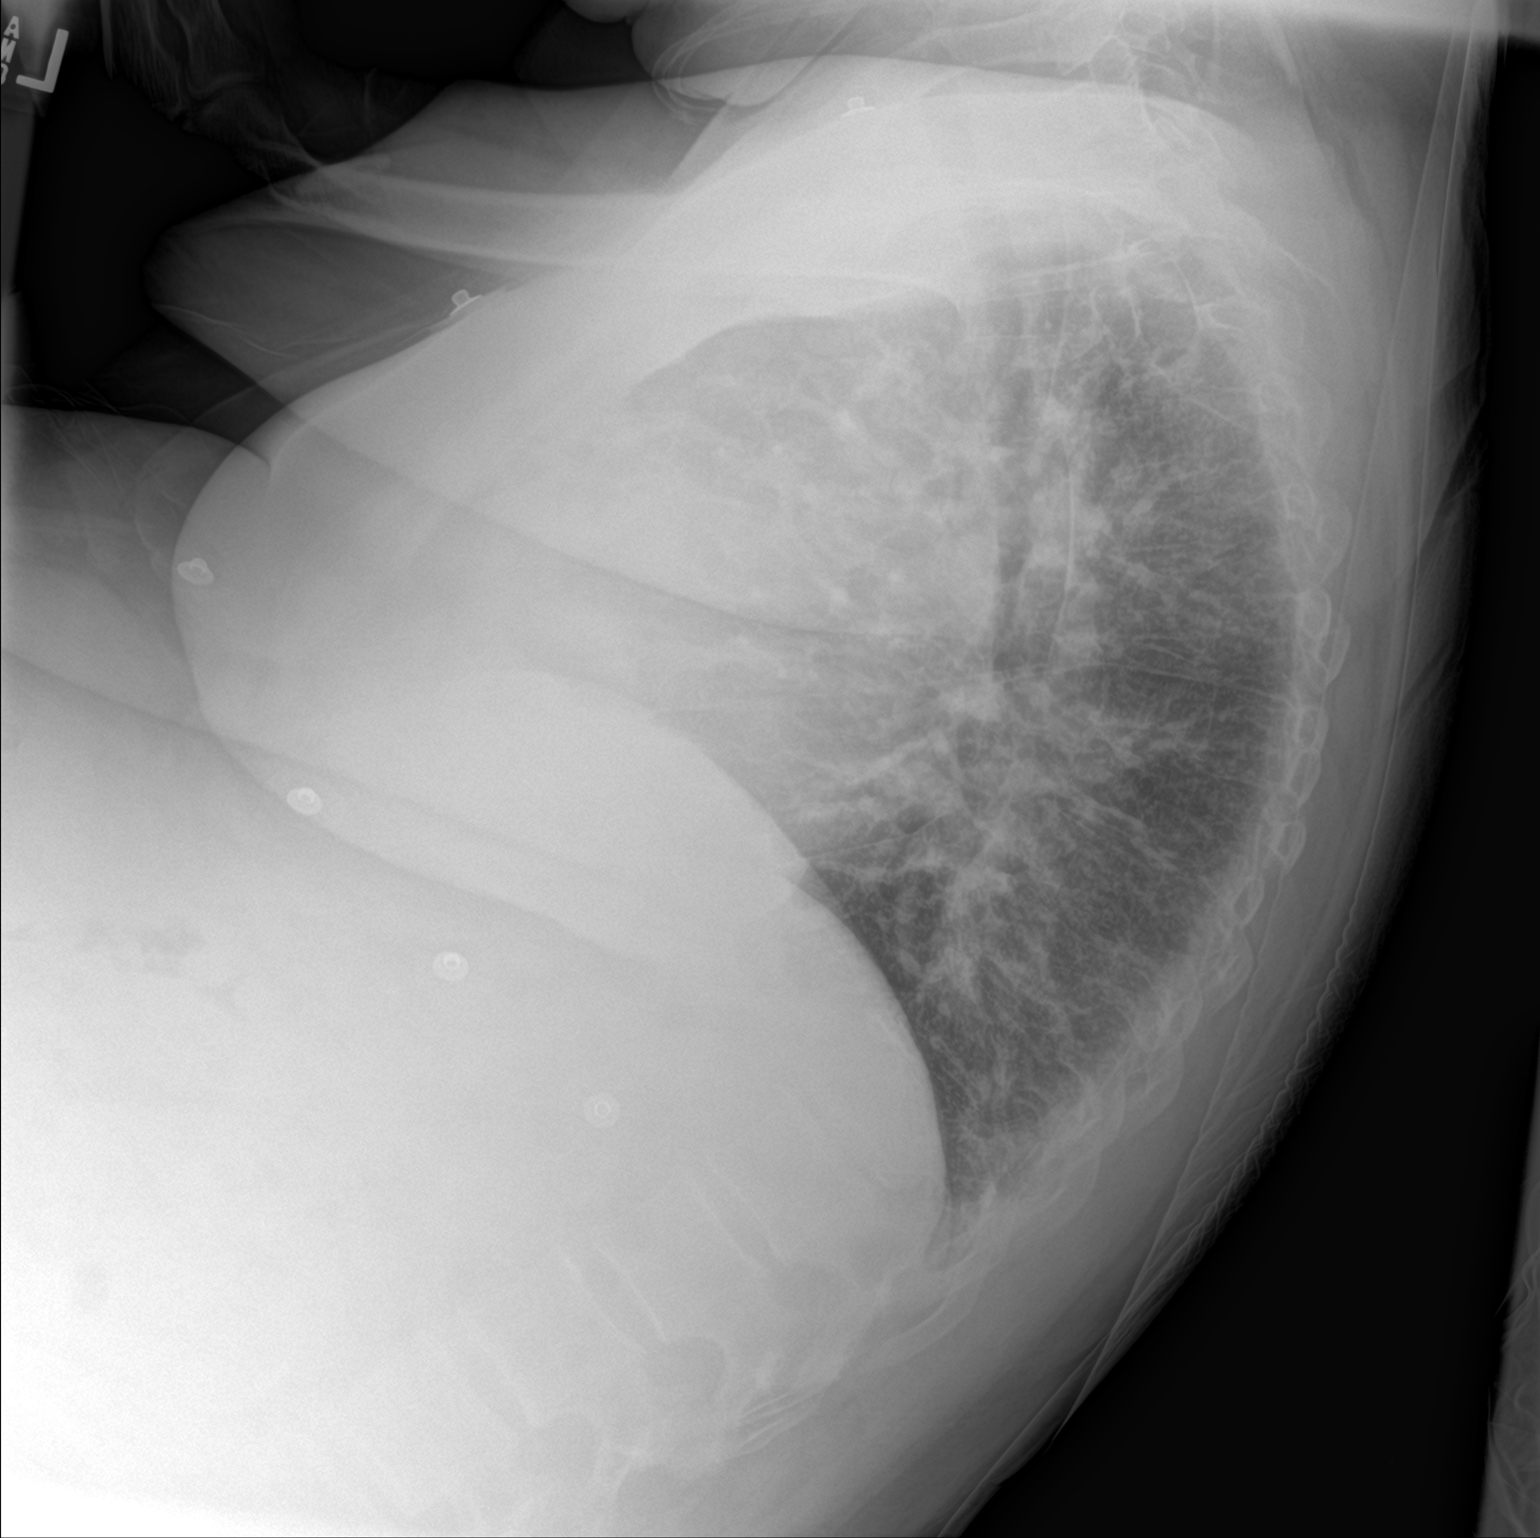

[chest ap]
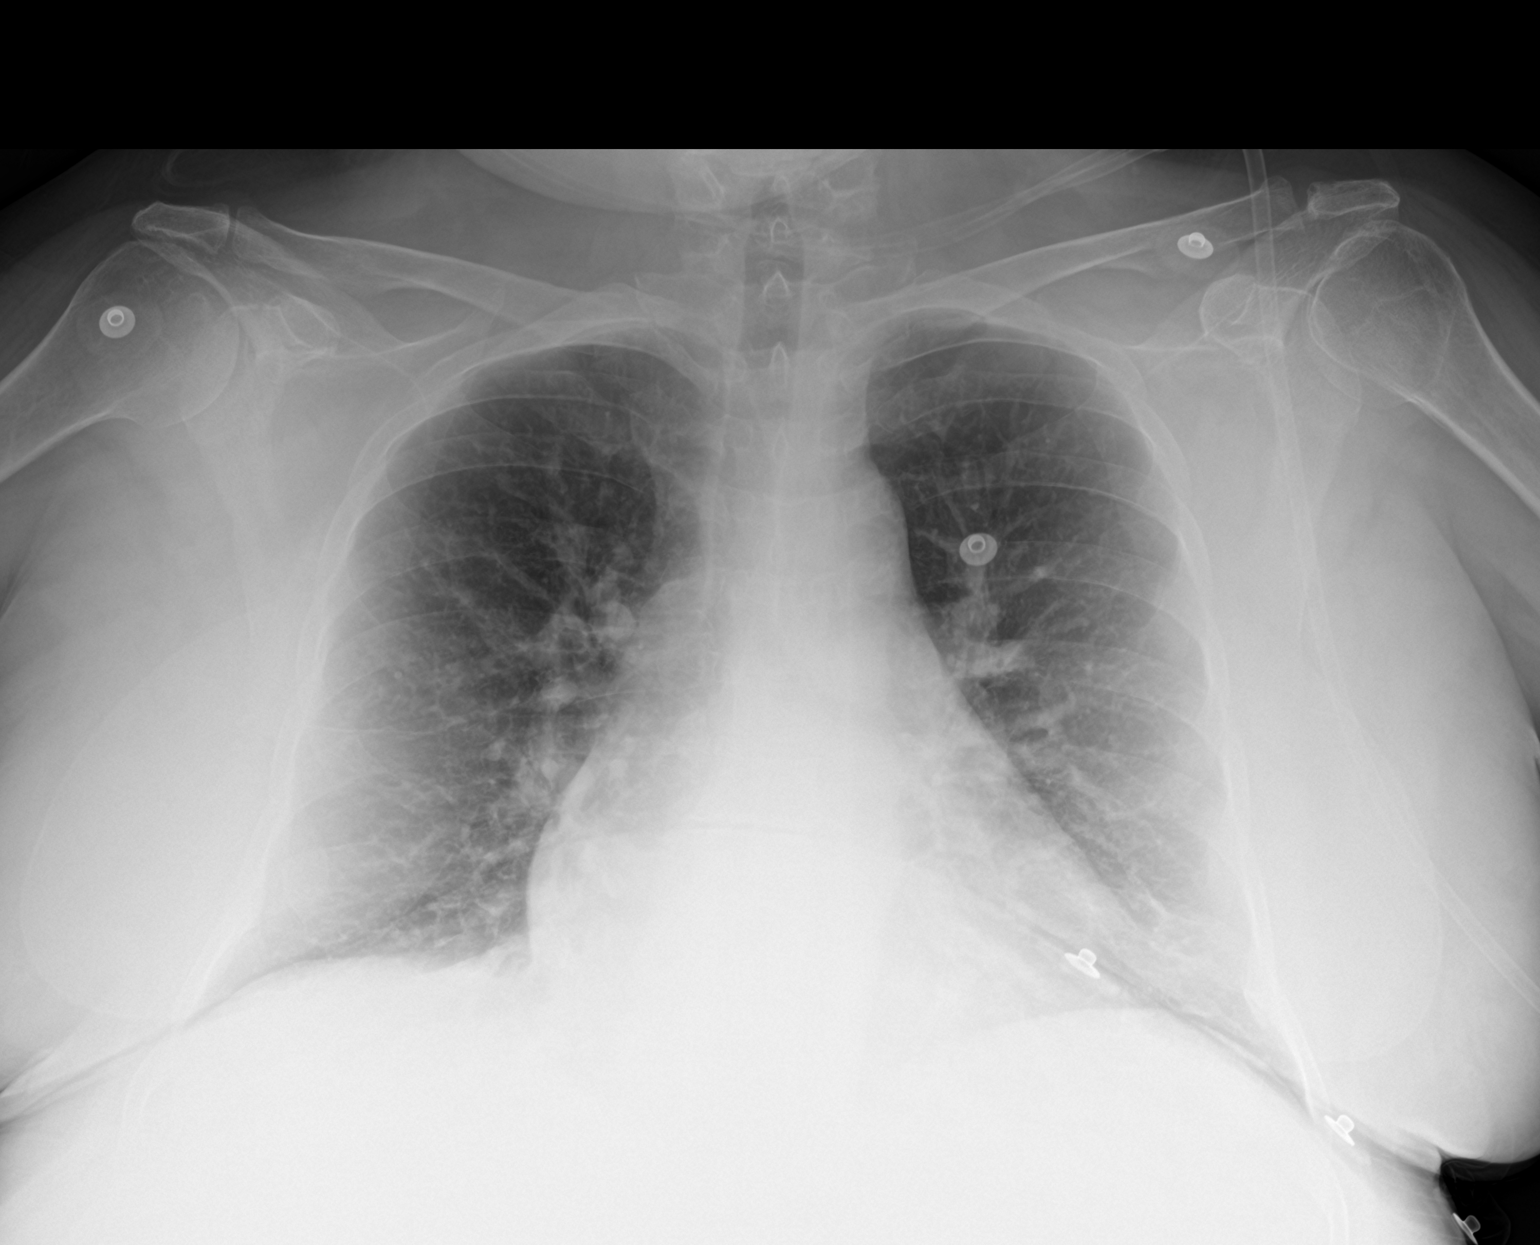

[2 of 2 positions shown; findings below may reference images not displayed]

FINDINGS: Lung volumes are low. No consolidative airspace disease. No pleural
effusions. No suspicious appearing pulmonary nodules or masses. No
pneumothorax. No evidence of pulmonary edema. Heart size is mildly
enlarged. Upper mediastinal contours are within normal limits.
IMPRESSION: 1. No radiographic evidence of acute cardiopulmonary disease.
2. Mild cardiomegaly.

## 2021-02-21 IMAGING — CR DG KNEE COMPLETE 4+V*L*
4 series · 4 of 4 positions shown · non-contrast
Comparison: 04/09/2018

CLINICAL DATA: LEFT knee pain post fall

EXAM:
LEFT KNEE - COMPLETE 4+ VIEW

[x knee ap left (1 of 3)]
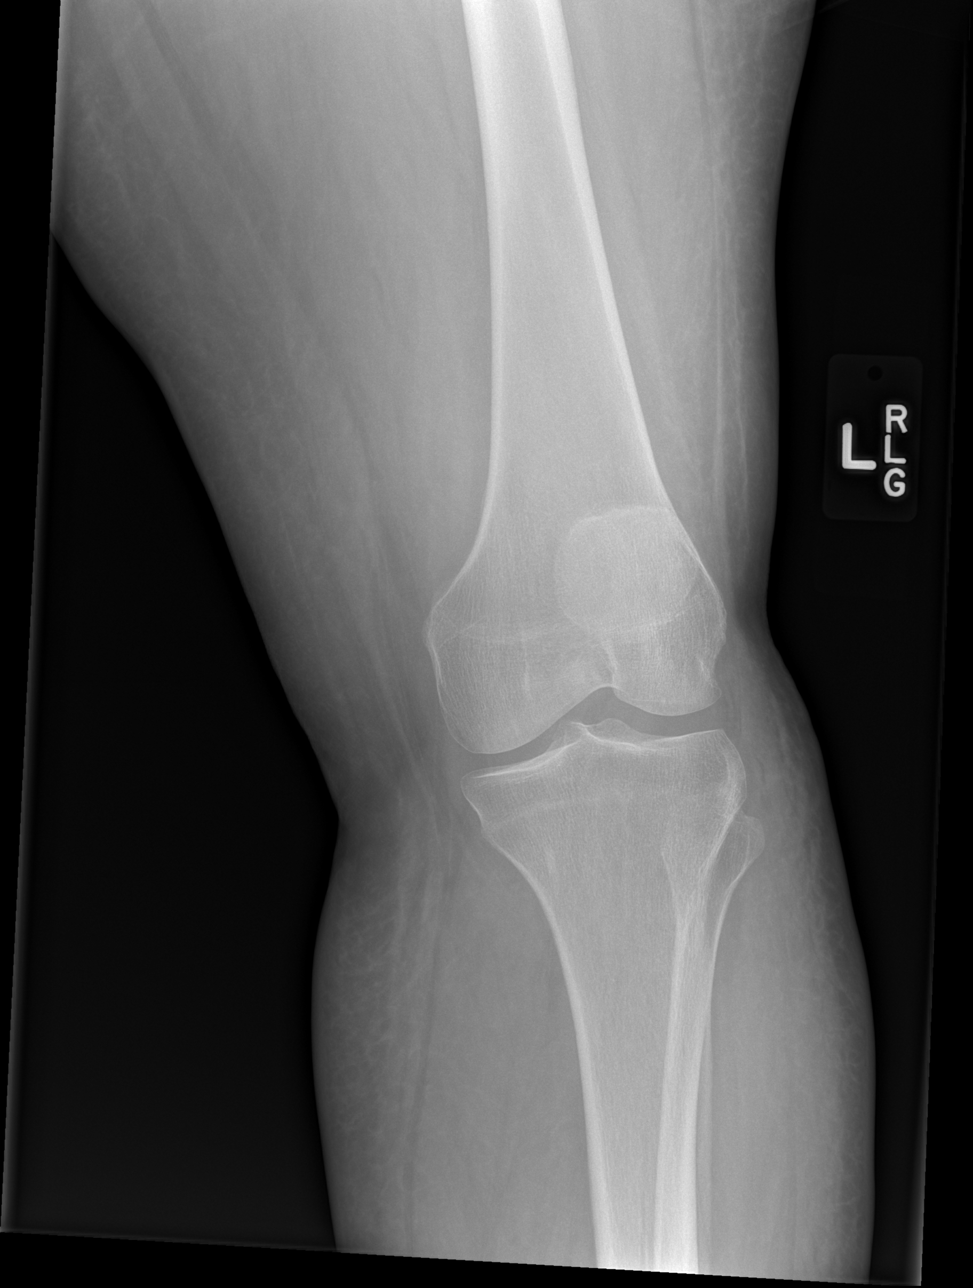

[x knee ap left (2 of 3)]
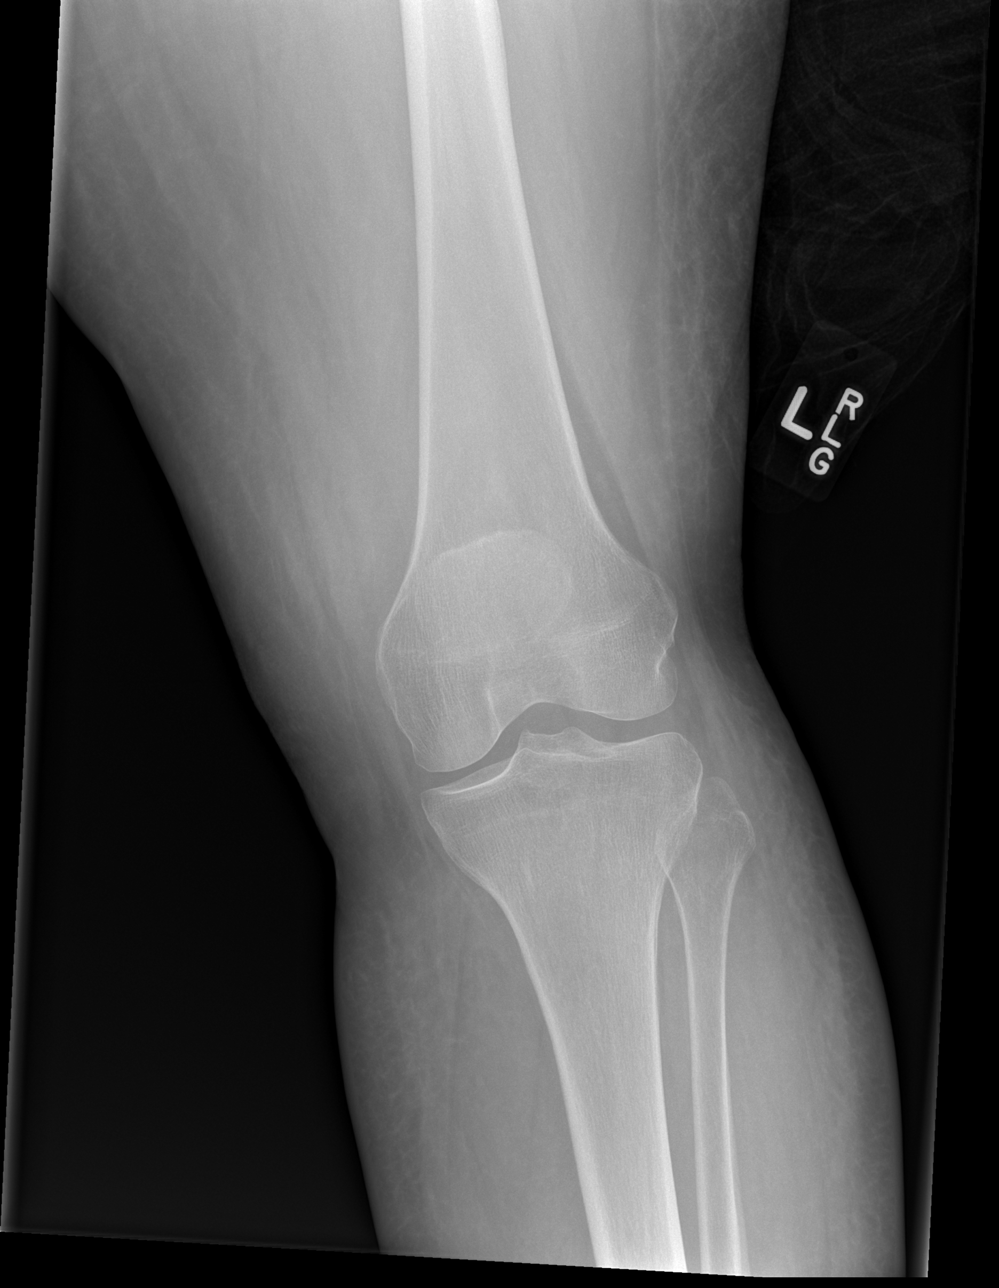

[x knee ap left (3 of 3)]
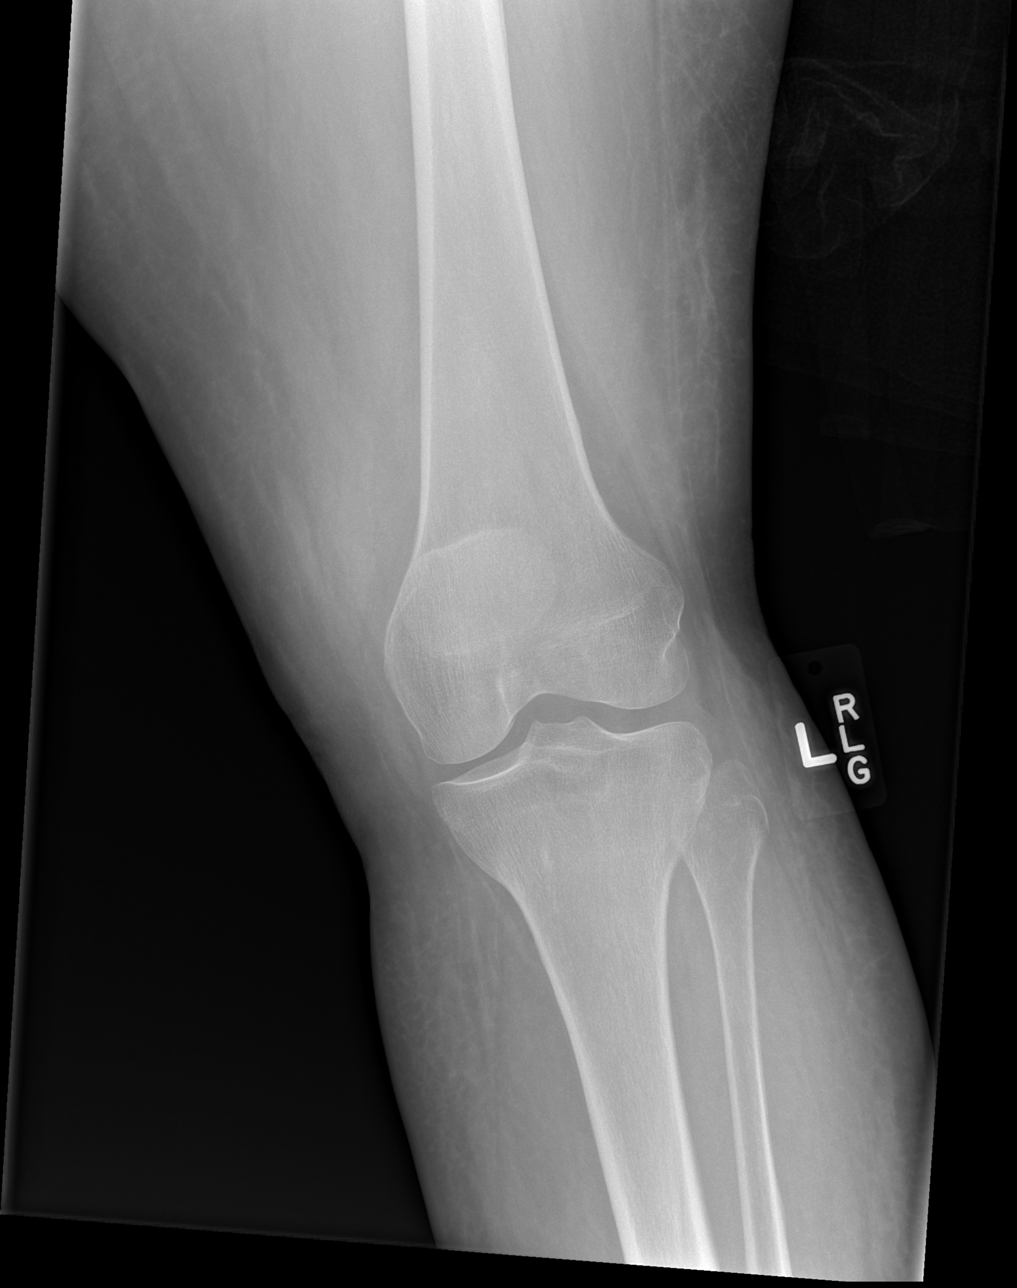

[x knee lat left]
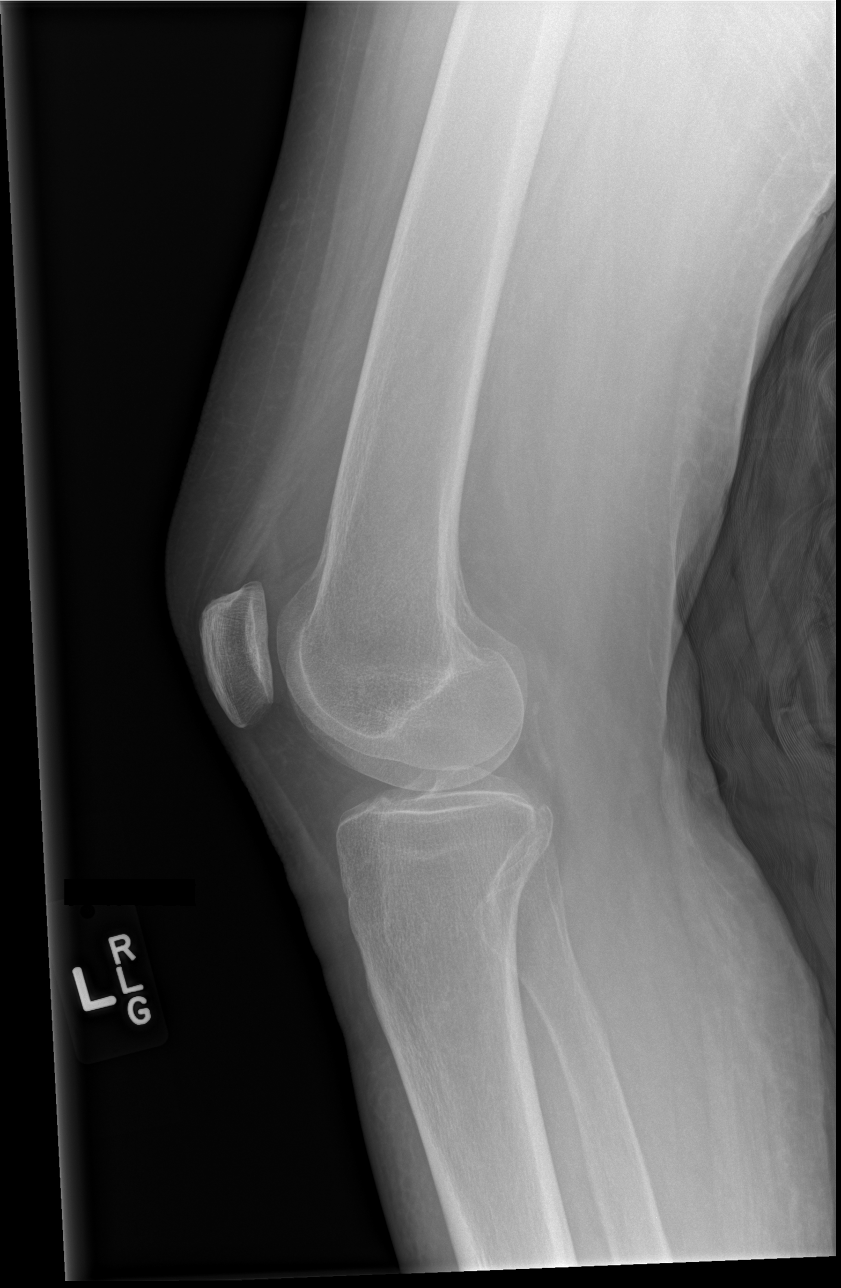

[4 of 4 positions shown; findings below may reference images not displayed]

FINDINGS: Osseous mineralization appears diffusely decreased.

Joint spaces preserved.

No acute fracture, dislocation, or bone destruction.

No knee joint effusion.
IMPRESSION: No acute osseous abnormalities.

## 2021-02-21 IMAGING — CR DG CHEST 2V
2 series · 2 of 2 positions shown · non-contrast
Comparison: Chest CT 04/11/2018

CLINICAL DATA: Hypoxia, abnormal breath sounds

EXAM:
CHEST - 2 VIEW

[w chest lat]
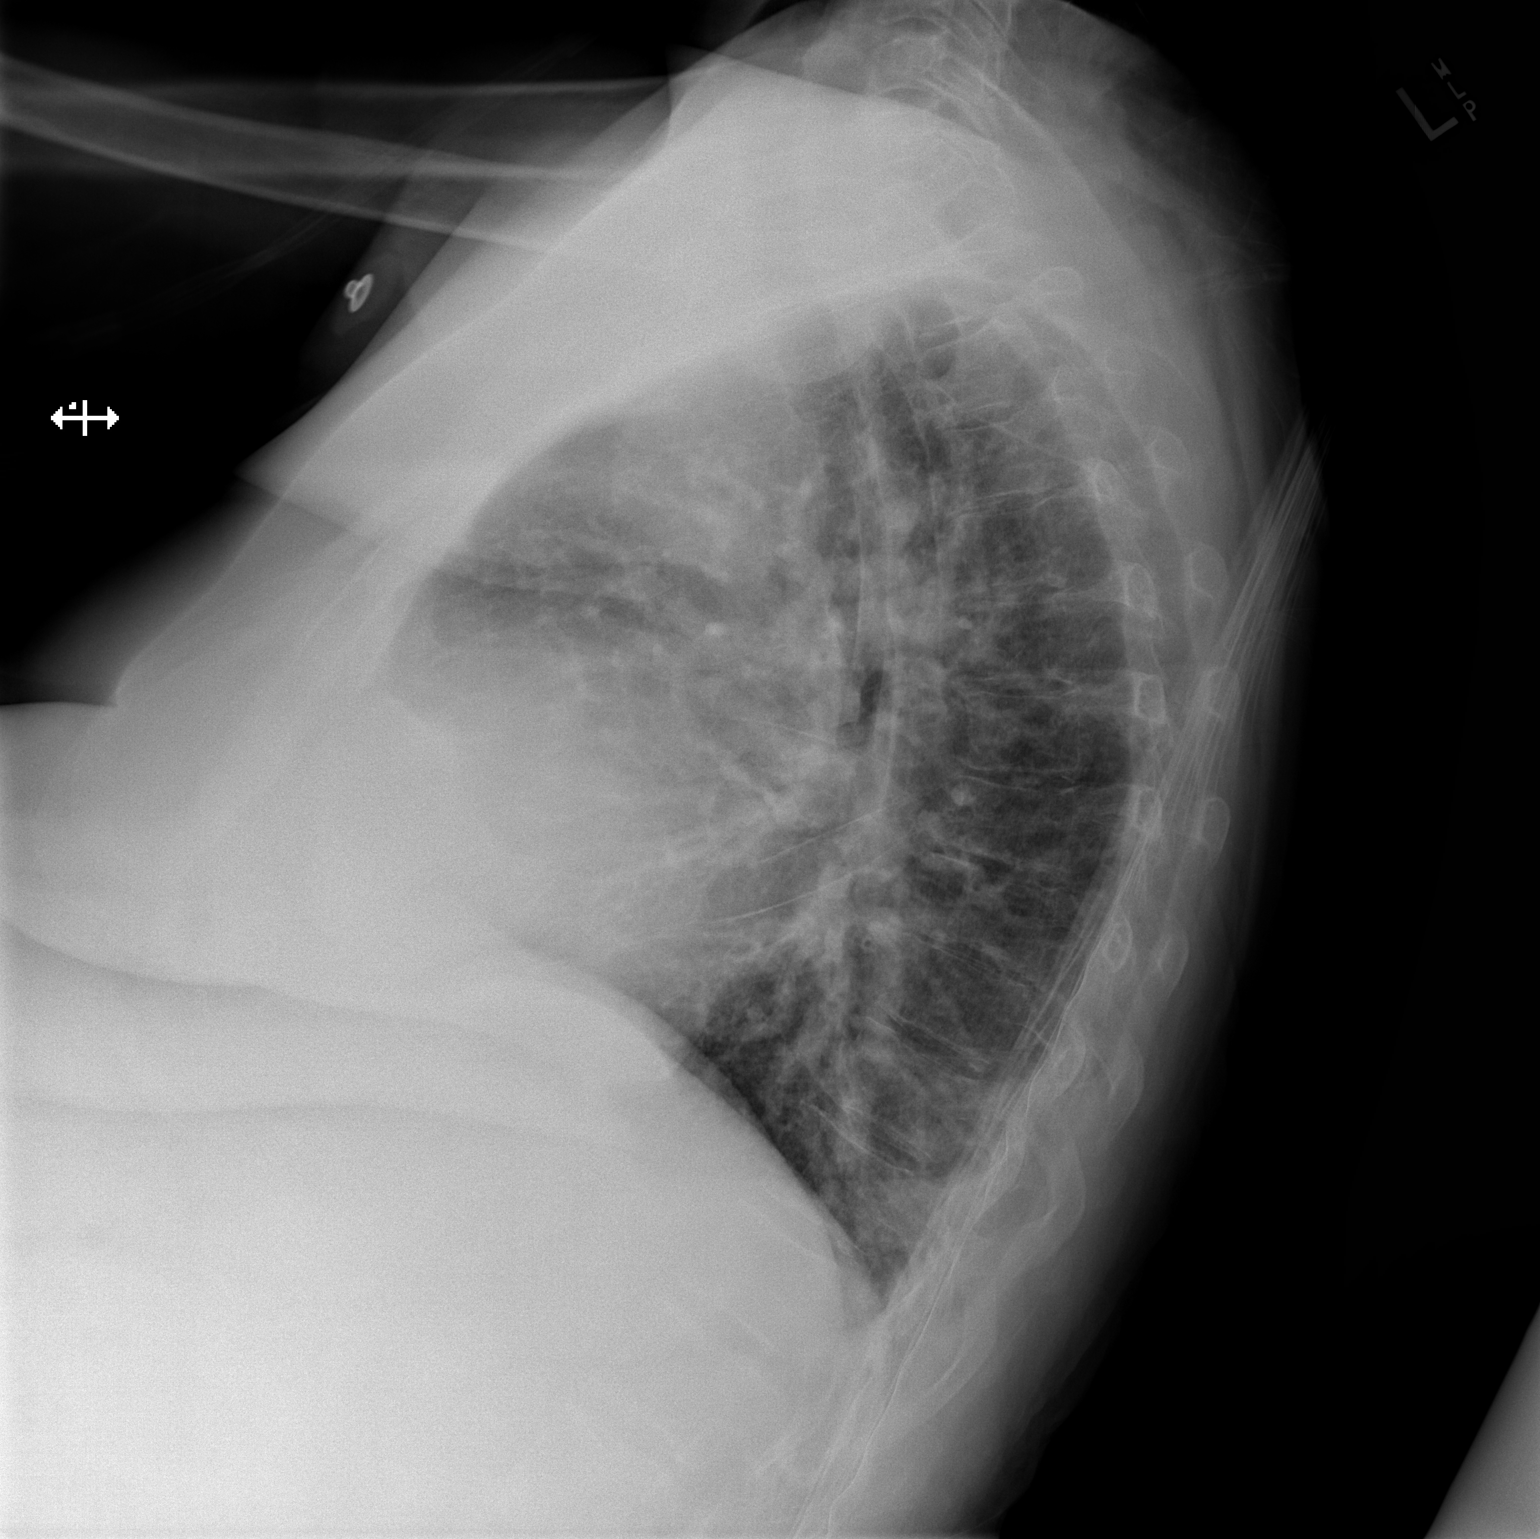

[x chest ap]
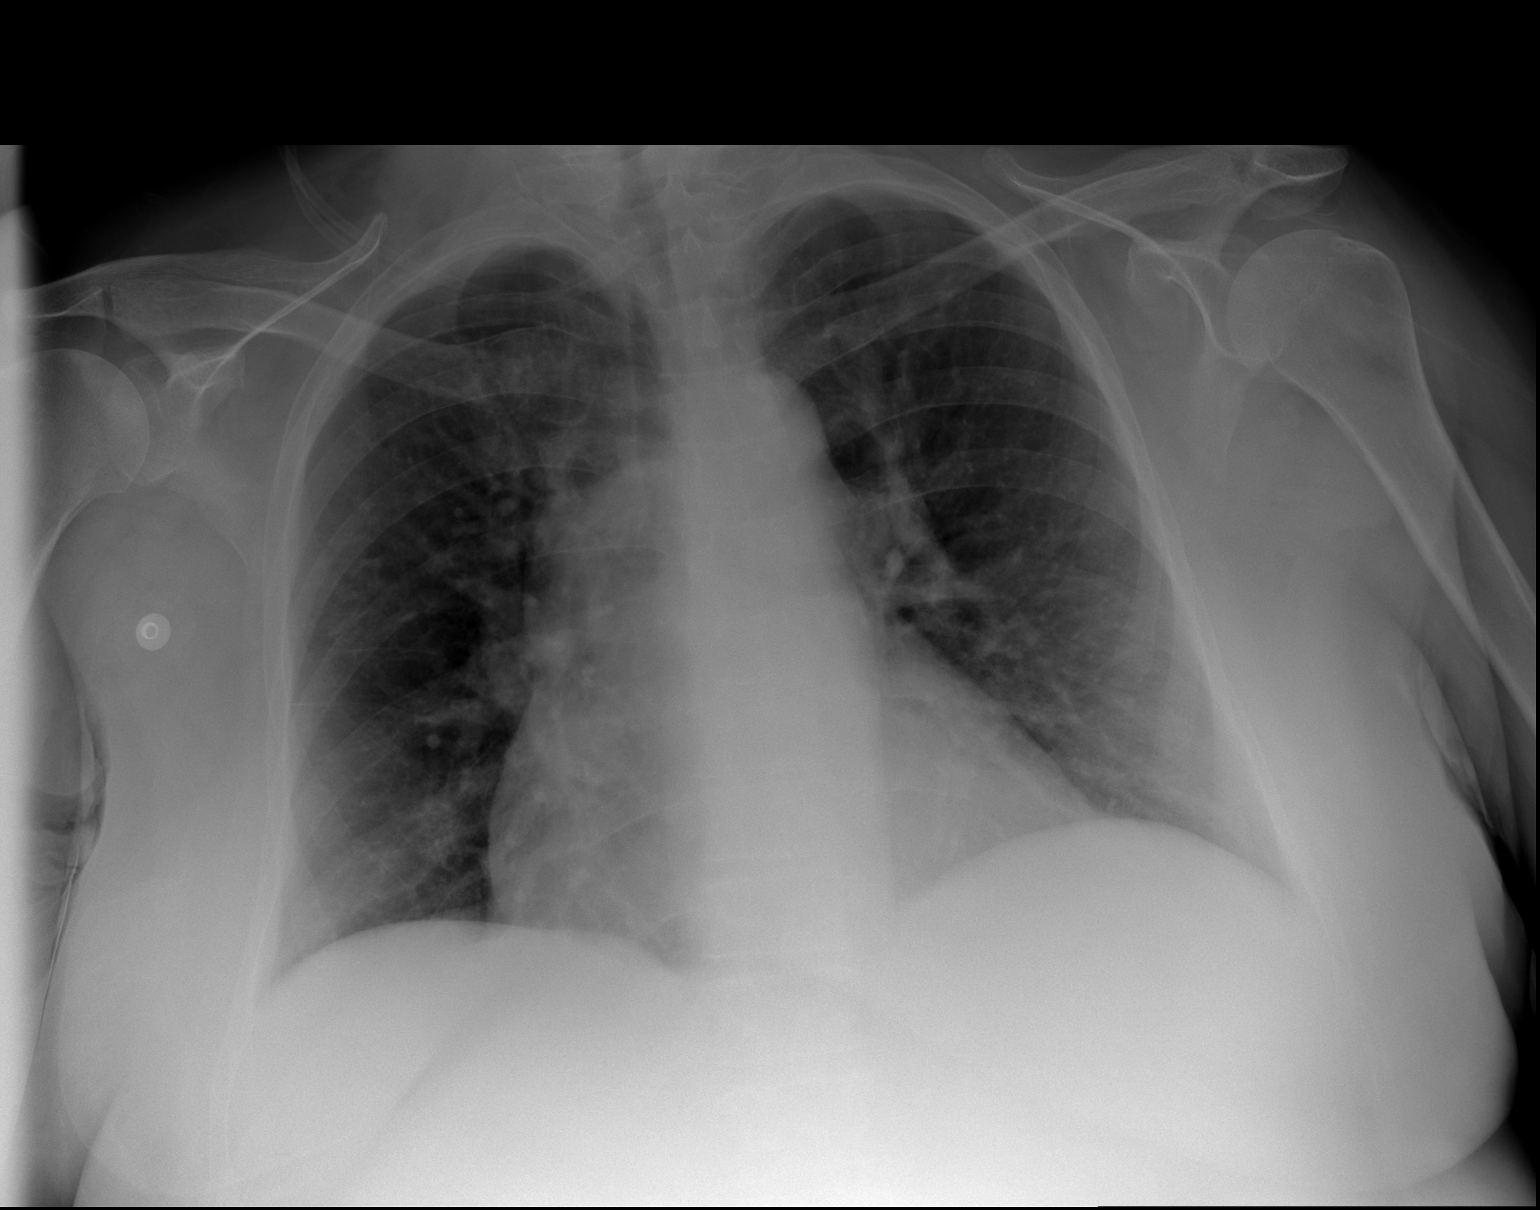

[2 of 2 positions shown; findings below may reference images not displayed]

FINDINGS: Mild cardiomegaly. No confluent opacities, effusions or edema. No
acute bony abnormality.
IMPRESSION: Cardiomegaly.  No active disease.

## 2021-02-23 IMAGING — DX DG CHEST 1V PORT
1 series · 1 of 1 positions shown · non-contrast
Comparison: 04/14/2018

CLINICAL DATA: Chest pain

EXAM:
PORTABLE CHEST 1 VIEW

[chest]
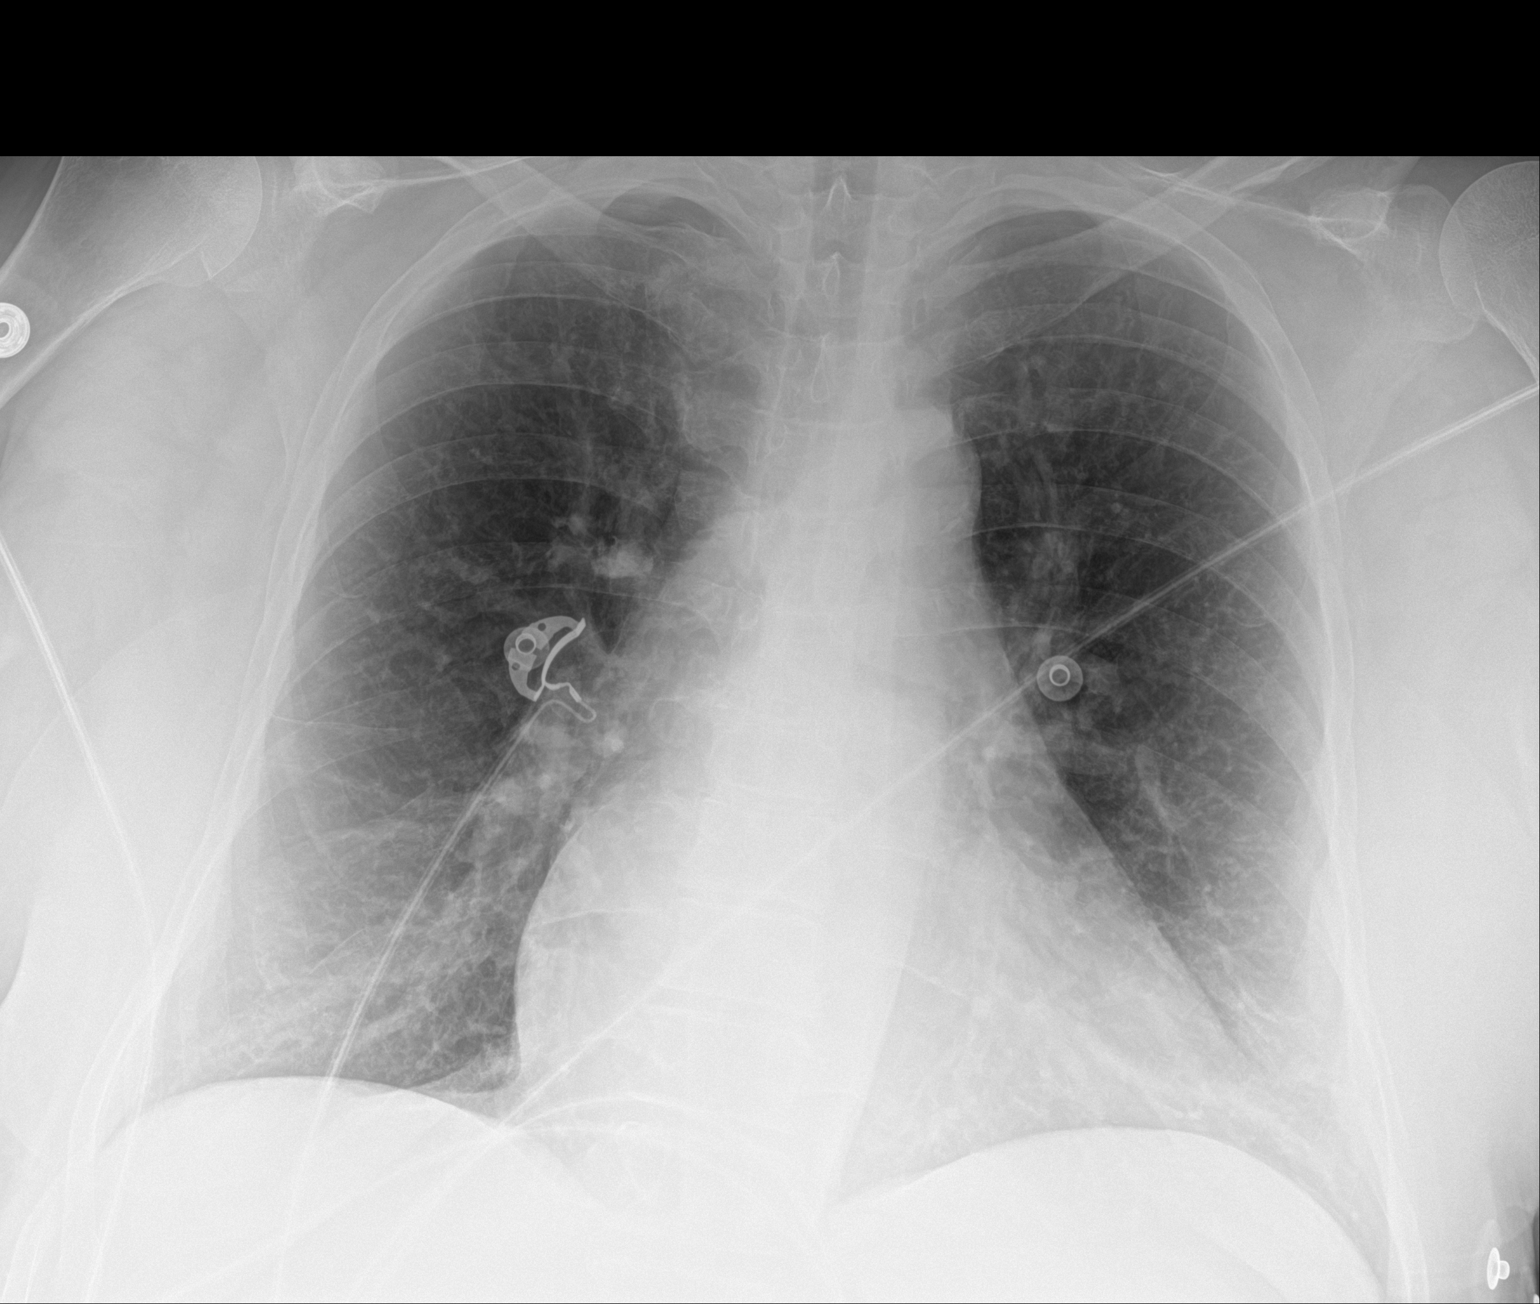

[1 of 1 positions shown; findings below may reference images not displayed]

FINDINGS: Stable cardiomegaly. Minimal aortic atherosclerosis without
aneurysm. No acute pulmonary consolidation, effusion or
pneumothorax. Minimal left basilar atelectasis. No acute nor
suspicious osseous abnormalities.
IMPRESSION: Stable cardiomegaly without active pulmonary disease.

## 2021-02-24 IMAGING — CR DG THORACIC SPINE 2V
4 series · 4 of 4 positions shown · non-contrast
Comparison: 04/14/2018 CXR

CLINICAL DATA: Pain after fall.

EXAM:
THORACIC SPINE 2 VIEWS

[t-spine ap]
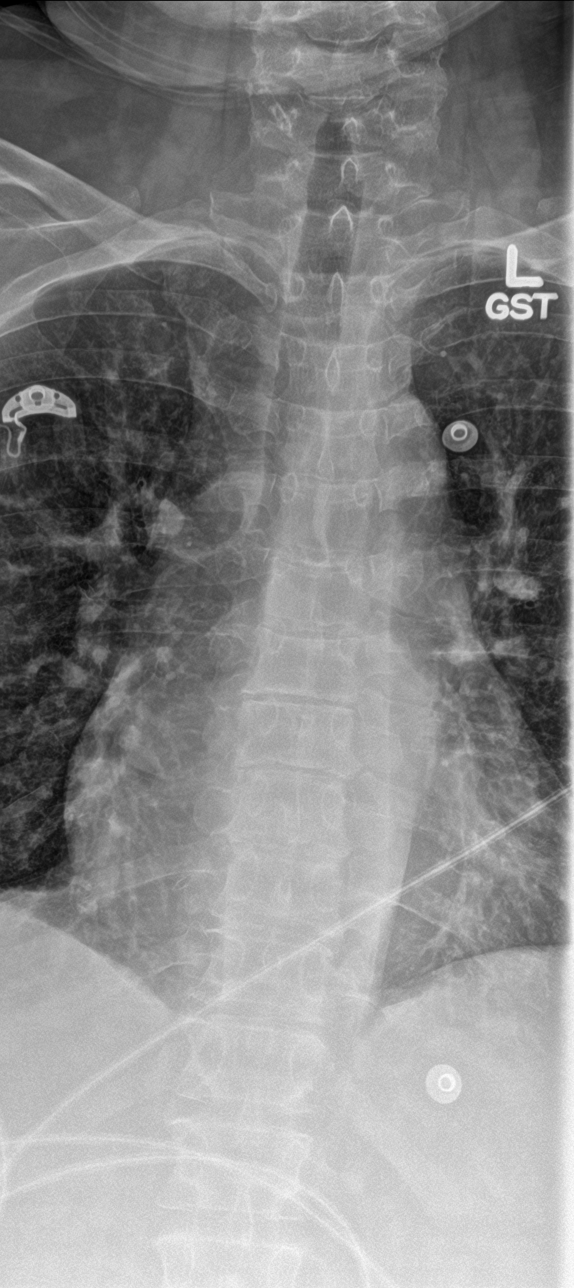

[t-spine lat (1 of 2)]
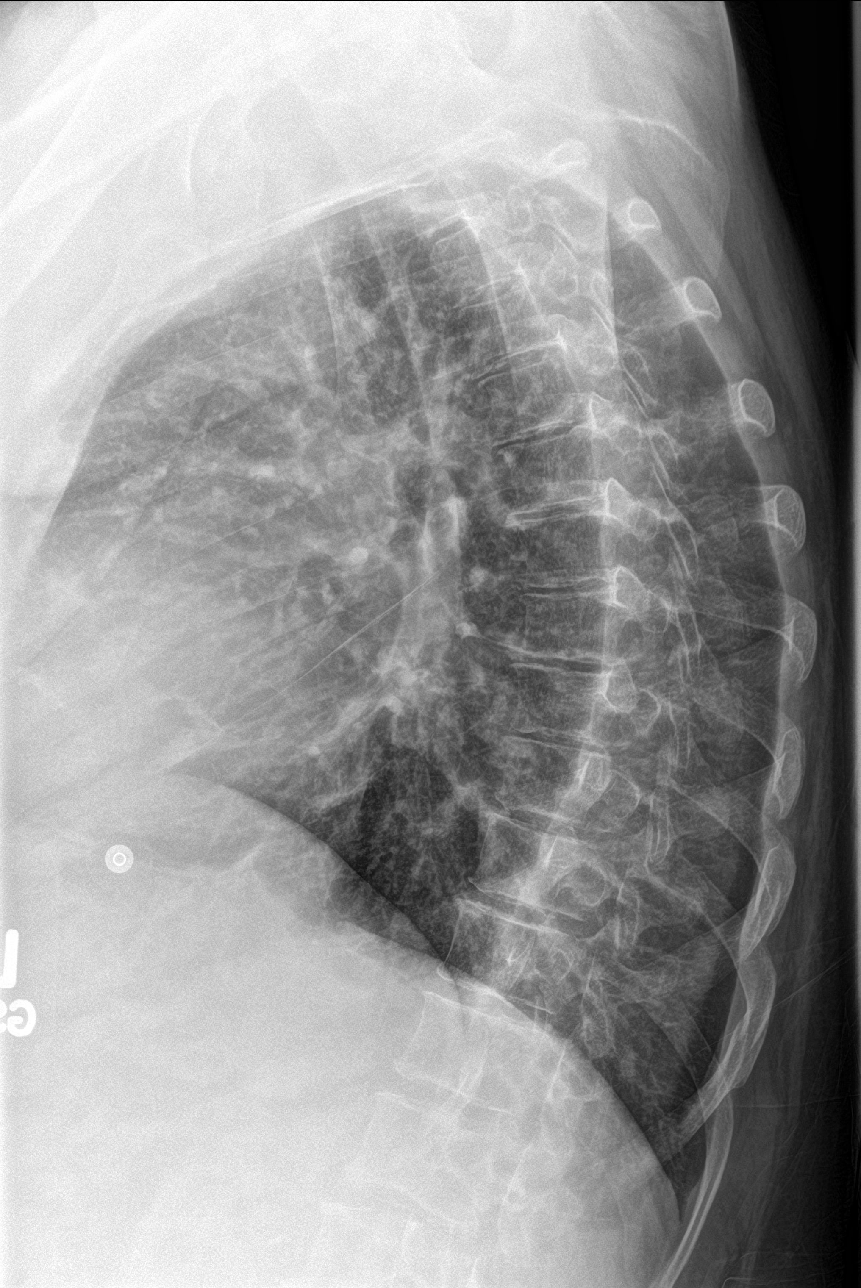

[t-spine swimmers]
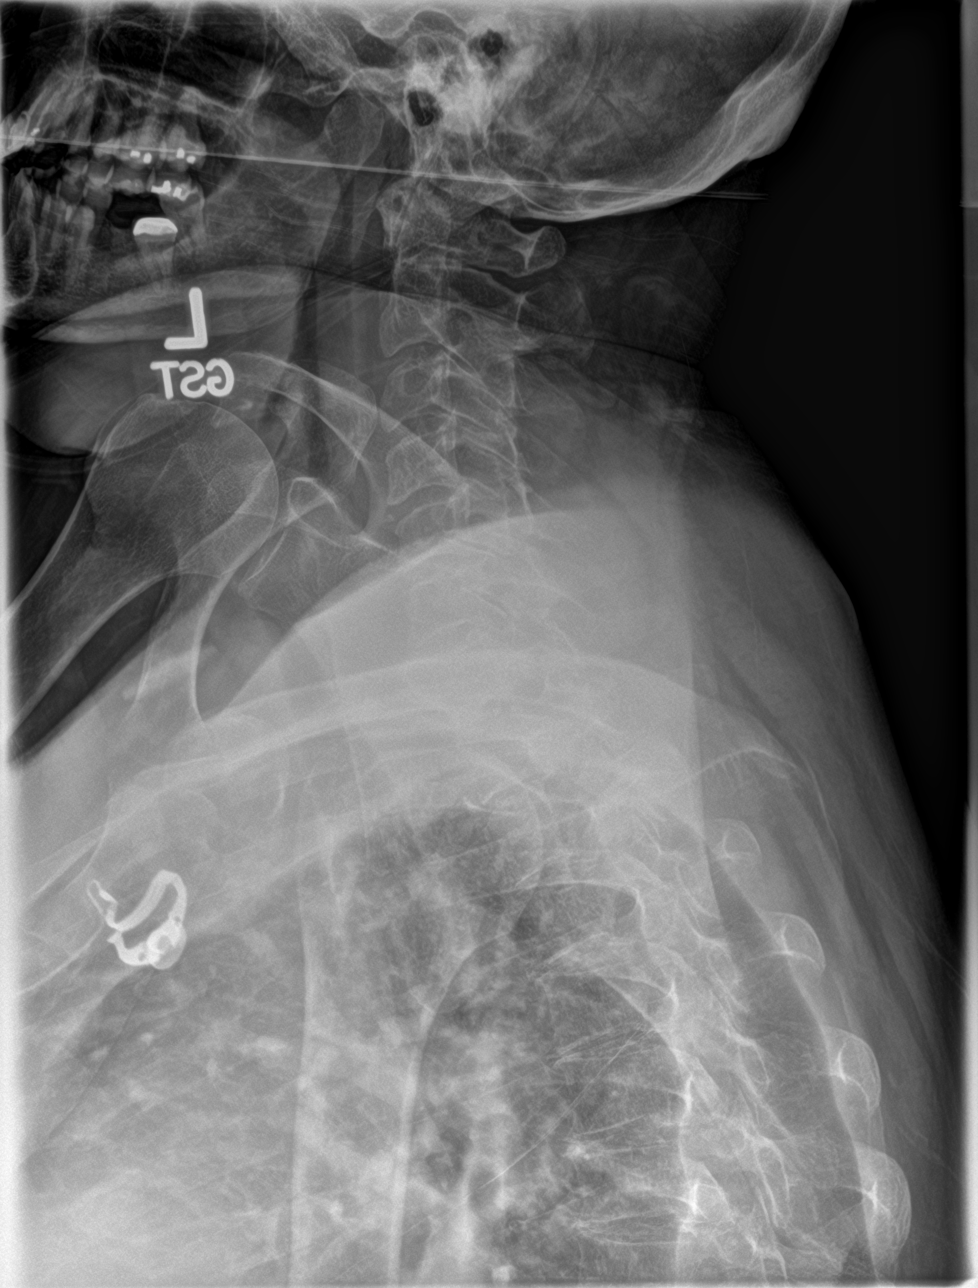

[t-spine lat (2 of 2)]
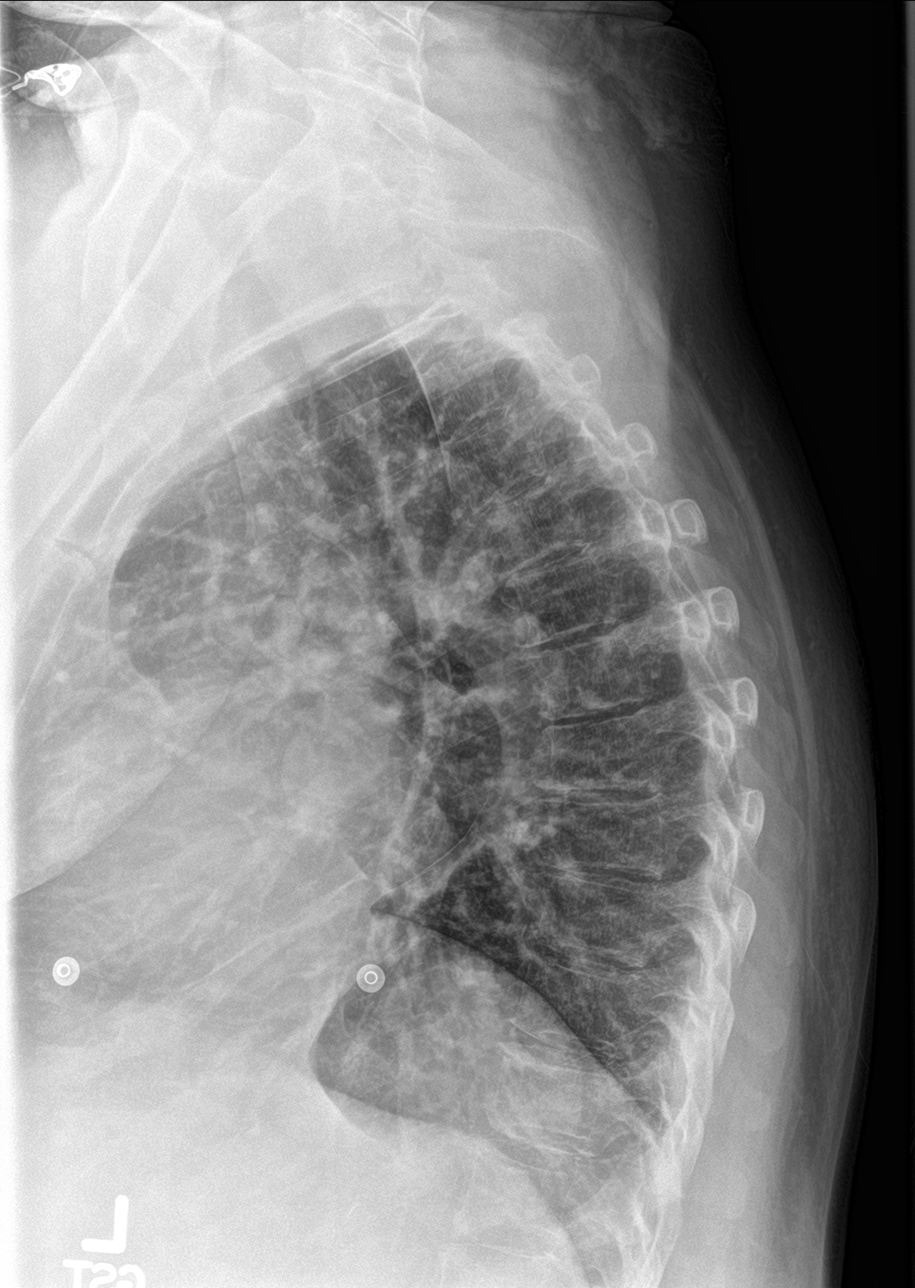

[4 of 4 positions shown; findings below may reference images not displayed]

FINDINGS: There is no evidence of thoracic spine fracture. Alignment is
normal. Mild multilevel disc space narrowing and minimal endplate
spurring is seen. No other significant bone abnormalities are
identified.
IMPRESSION: Mild degenerative change along the thoracic spine without acute
osseous appearing abnormality.

## 2021-02-25 IMAGING — CT CT HEAD WITHOUT CONTRAST
3 series · 15 of 46 positions shown, 18 images · non-contrast
Comparison: April 14, 2018

CLINICAL DATA: Altered mental status with recent fall.

EXAM:
CT HEAD WITHOUT CONTRAST
TECHNIQUE: Contiguous axial images were obtained from the base of the skull
through the vertex without intravenous contrast.

[Series 2: head wo · axial · 0.47mm/px · z∈[+1295,+1415]mm · 9 of 29 slices shown, 12 images]
[im 3/29  brain]
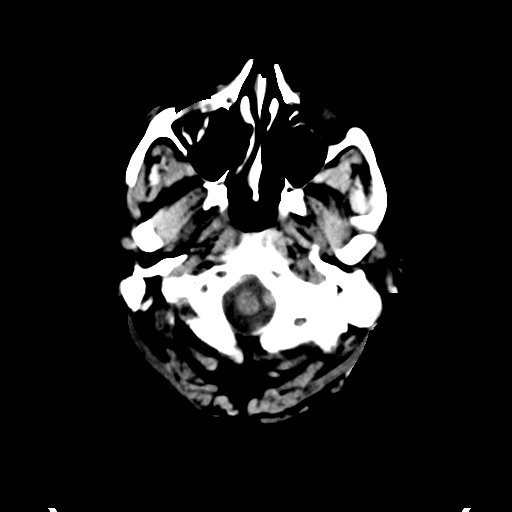
[im 3/29  bone]
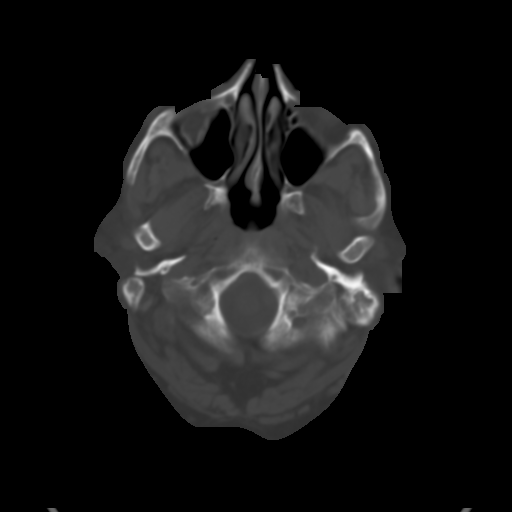
[im 6/29  brain]
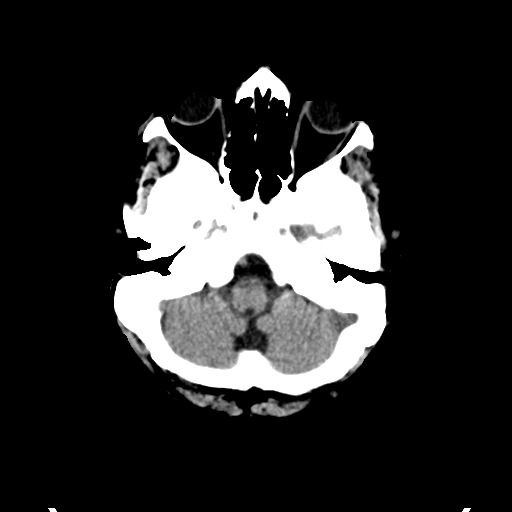
[im 9/29  brain]
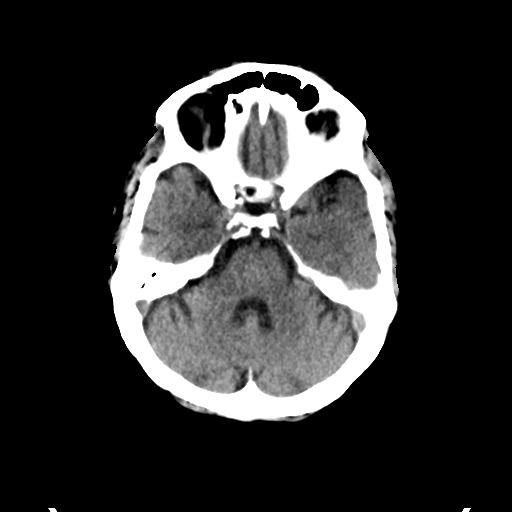
[im 12/29  brain]
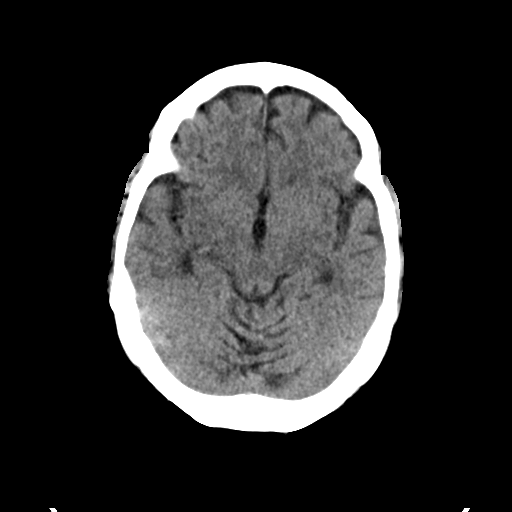
[im 15/29  brain]
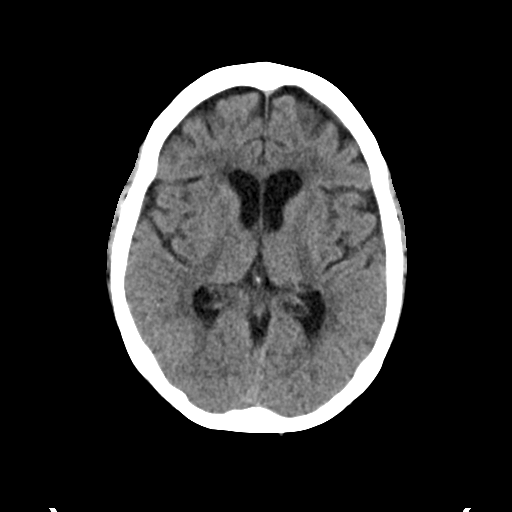
[im 15/29  bone]
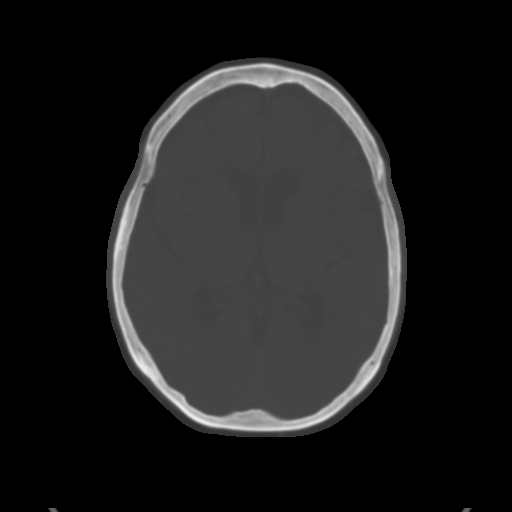
[im 18/29  brain]
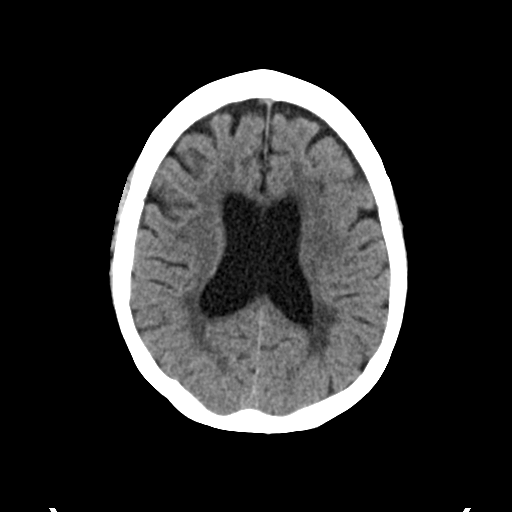
[im 21/29  brain]
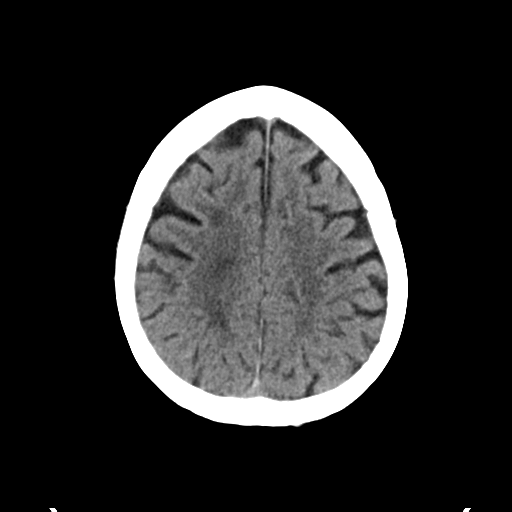
[im 24/29  brain]
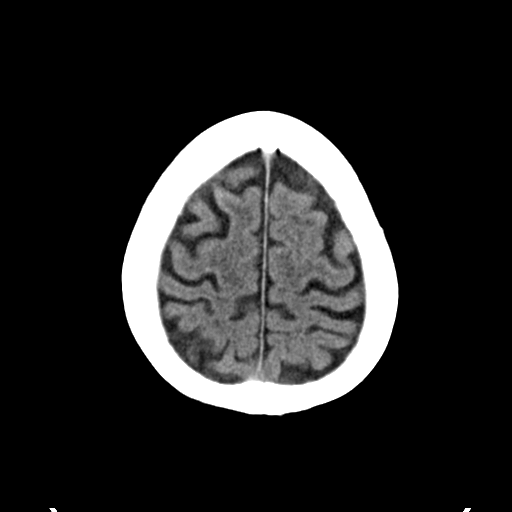
[im 27/29  brain]
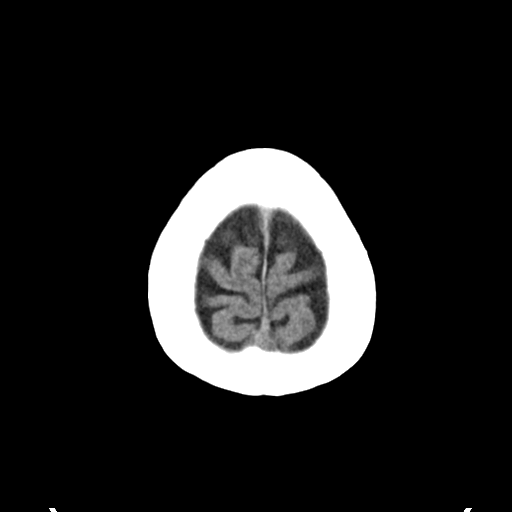
[im 27/29  bone]
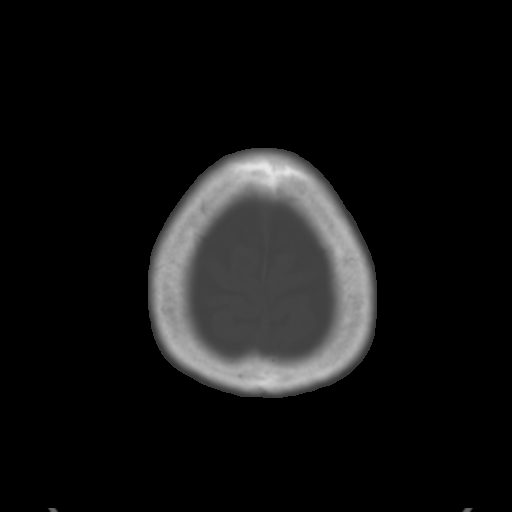

[Series 4: coronal soft tissue · coronal · 0.35mm/px · 3 of 64 slices shown]
[im 22/64  brain]
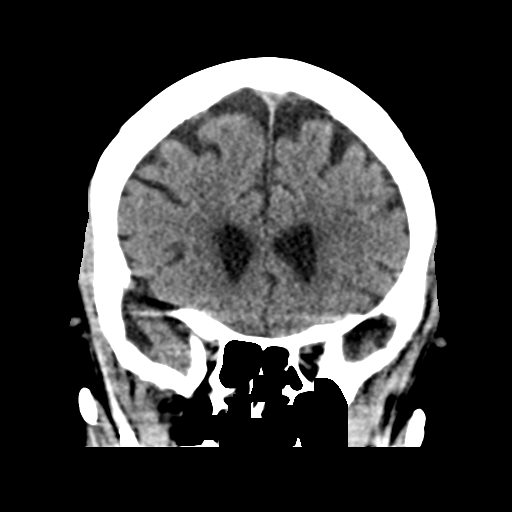
[im 29/64  brain]
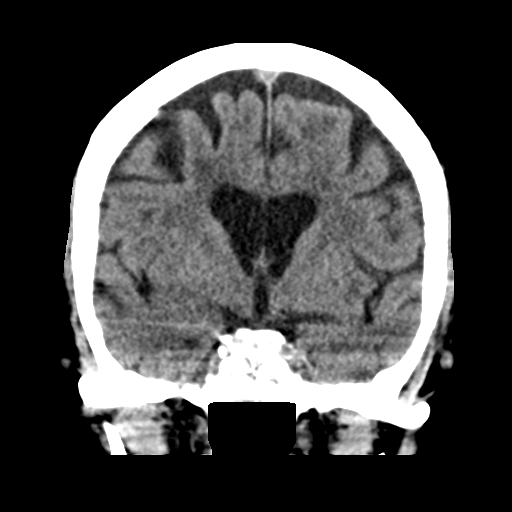
[im 36/64  brain]
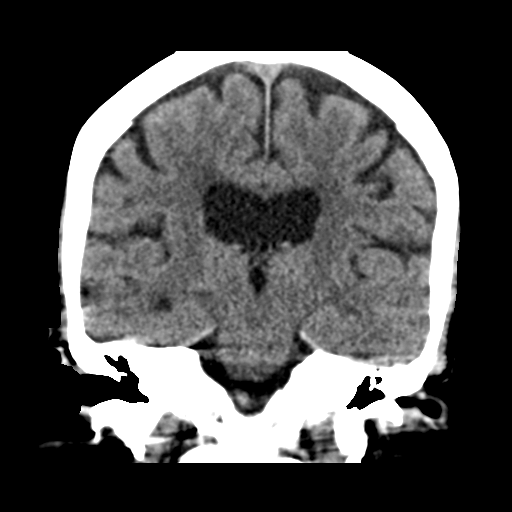

[Series 5: sagittal soft tissue · sagittal · 0.31mm/px · 3 of 54 slices shown]
[im 18/54  brain]
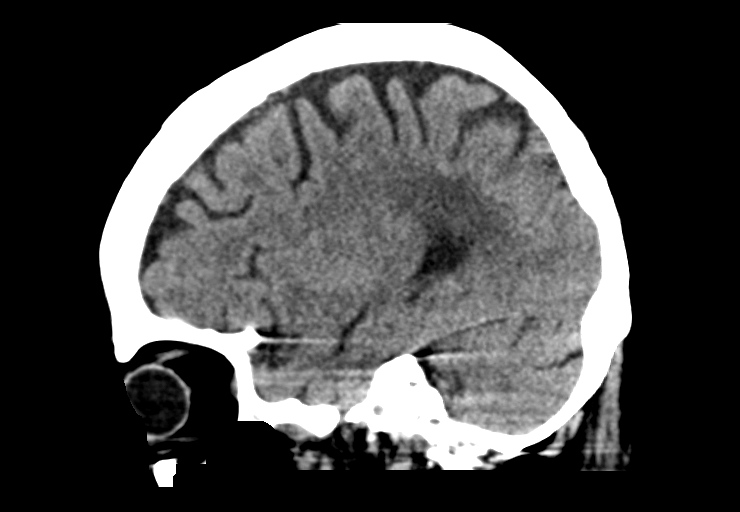
[im 27/54  brain]
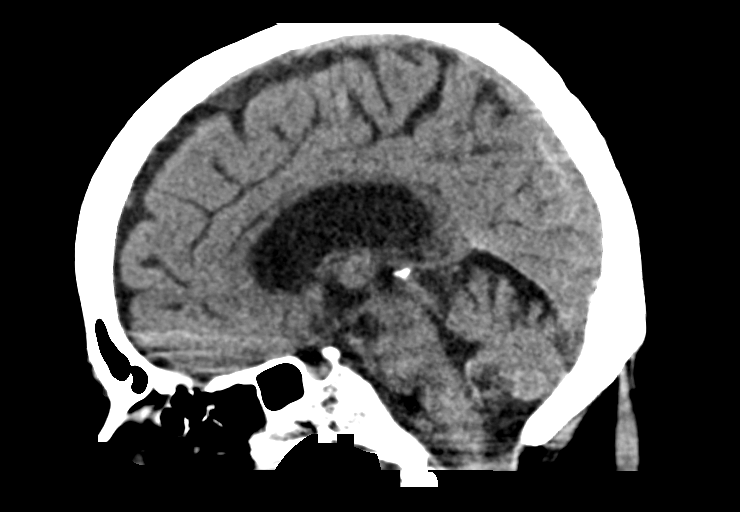
[im 36/54  brain]
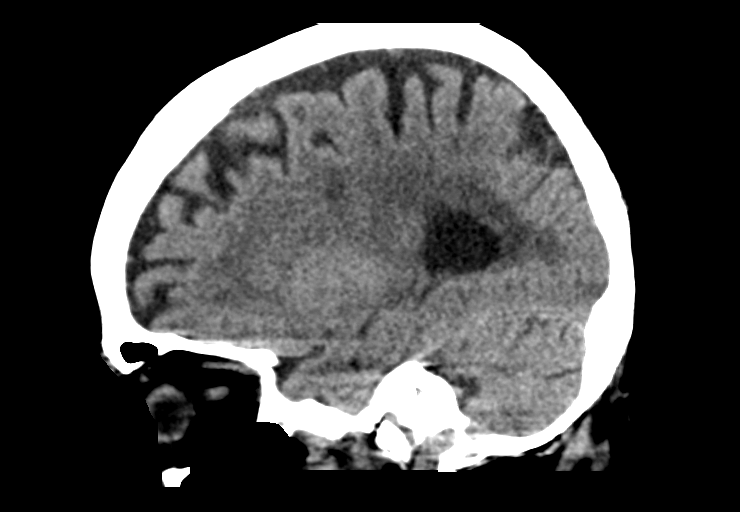

[15 of 46 positions shown; findings below may reference images not displayed]

FINDINGS: Brain: There is mild diffuse atrophy. There is no intracranial mass,
hemorrhage, extra-axial fluid collection, or midline shift. There is
patchy small vessel disease throughout portions of the centra
semiovale bilaterally, stable. No acute appearing infarct is
evident.

Vascular: No hyperdense vessel. Foci of calcification are noted in
each carotid siphon region.

Skull: The bony calvarium appears intact.

Sinuses/Orbits: There is a retention cyst in the right maxillary
antrum. There is mild mucosal thickening in anterior ethmoid air
cells. Other visualized paranasal sinuses are clear. Orbits appear
symmetric bilaterally.

Other: Mastoids bilaterally are somewhat hypoplastic but clear.
IMPRESSION: Stable atrophy with patchy periventricular small vessel disease. No
acute infarct is demonstrable. No mass or hemorrhage.

Foci of arterial vascular calcification noted. Areas of mild
paranasal sinus disease noted.

## 2021-02-25 IMAGING — DX PORTABLE CHEST - 1 VIEW
1 series · 1 of 1 positions shown · non-contrast
Comparison: 04/16/2018

CLINICAL DATA: Shortness of breath, hypoxia, CHF.

EXAM:
PORTABLE CHEST 1 VIEW

[chest ap]
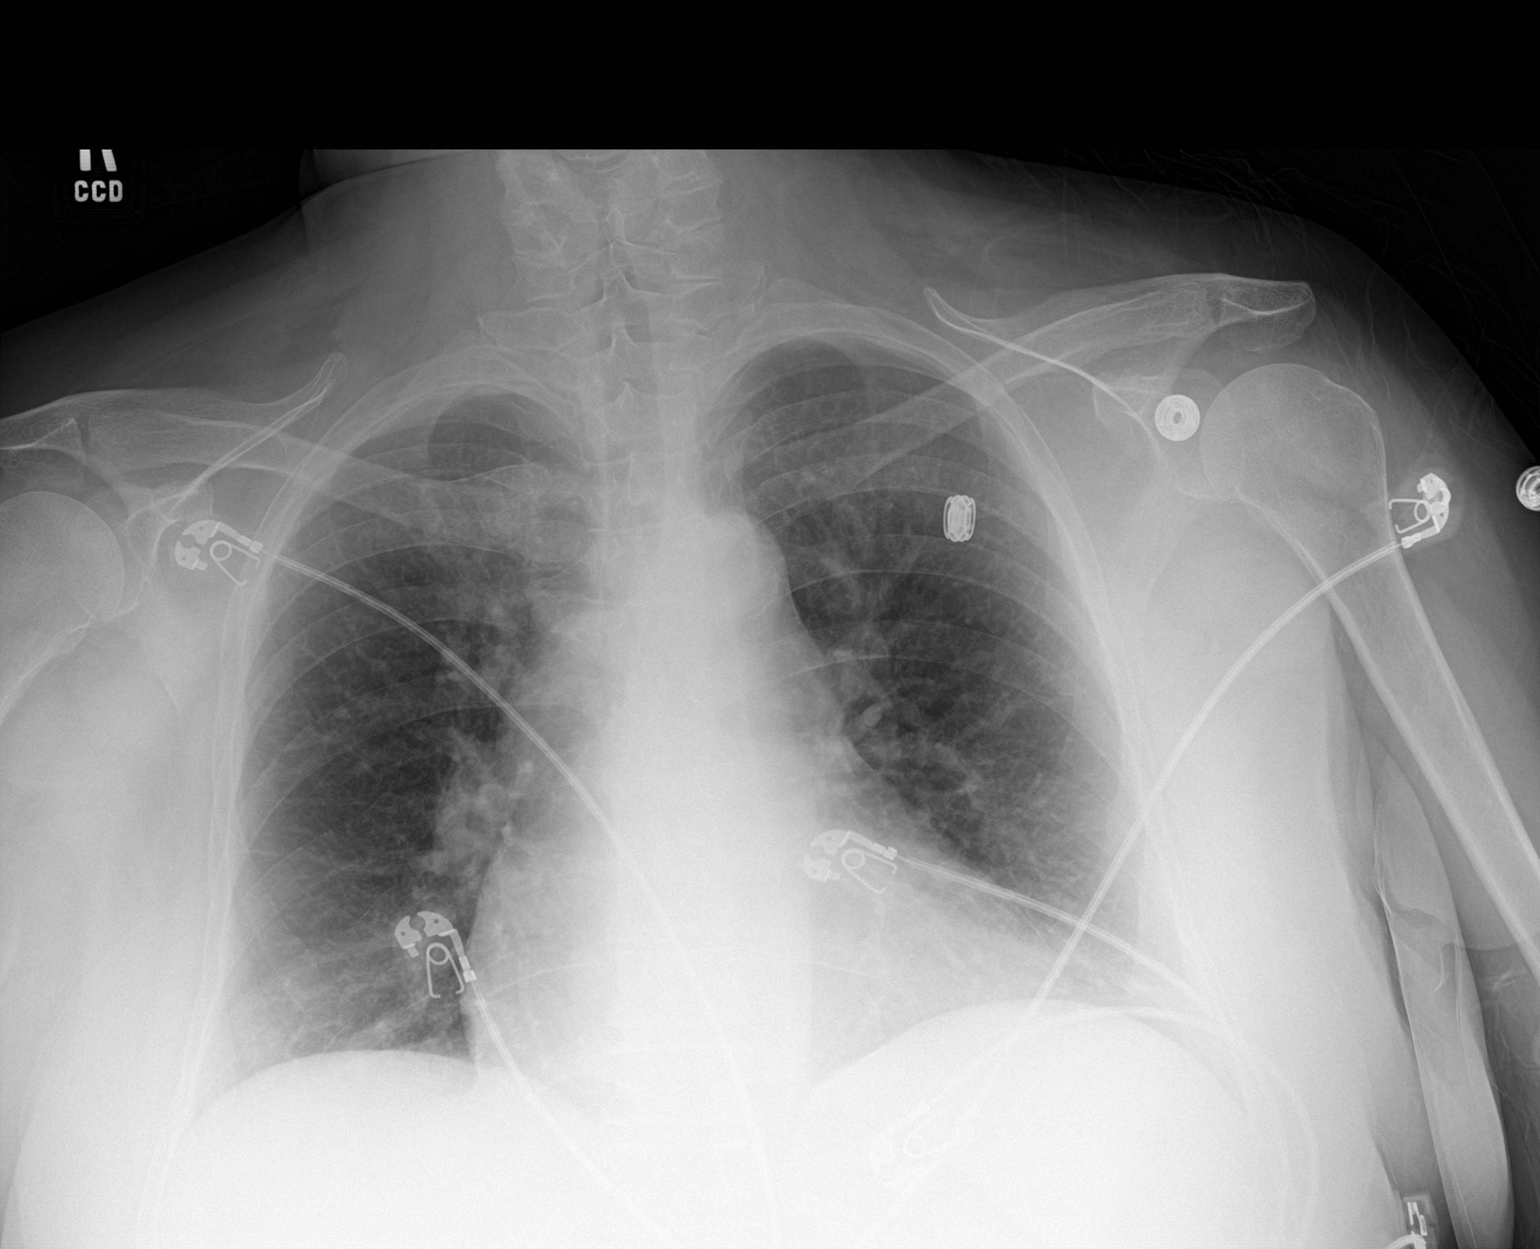

[1 of 1 positions shown; findings below may reference images not displayed]

FINDINGS: The cardiac silhouette remains enlarged. Aortic atherosclerosis is
noted. The lungs are mildly hypoinflated with mild central pulmonary
vascular congestion. No overt edema, airspace consolidation,
sizeable pleural effusion, or pneumothorax is identified. No acute
osseous abnormality is seen.
IMPRESSION: Cardiomegaly and mild hypoinflation with mild pulmonary vascular
congestion.

## 2021-03-03 IMAGING — DX PORTABLE CHEST - 1 VIEW
1 series · 1 of 1 positions shown · non-contrast
Comparison: CT chest 04/11/2018.  PA and lateral chest 01/25/2018.

CLINICAL DATA: Shortness of breath.

EXAM:
PORTABLE CHEST 1 VIEW

[chest ap]
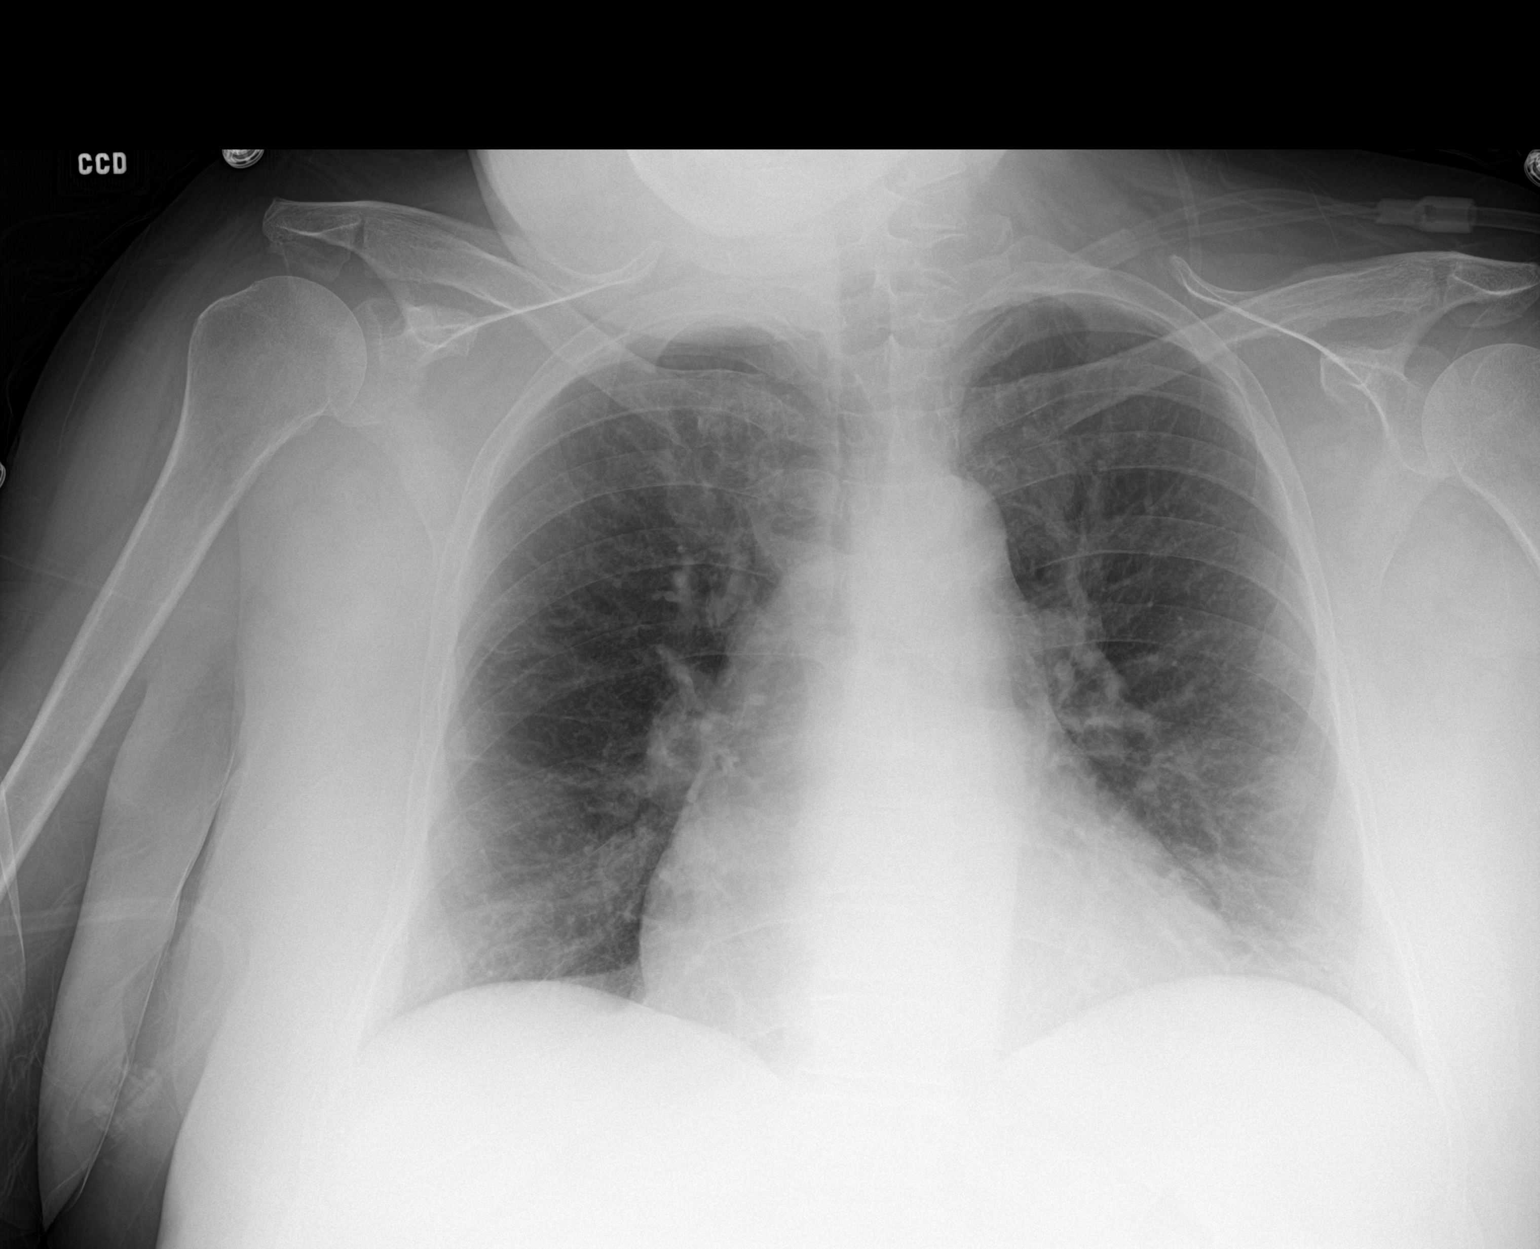

[1 of 1 positions shown; findings below may reference images not displayed]

FINDINGS: The lungs are clear. Heart size is upper normal. No pneumothorax or
pleural fluid. No acute or focal bony abnormality.
IMPRESSION: No acute disease.

## 2021-03-03 IMAGING — CT CT CERVICAL SPINE WITHOUT CONTRAST
4 of 11 series · 9 of 33 positions shown, 10 images · non-contrast
Comparison: Head CT 04/18/2018

CLINICAL DATA: Head trauma. Neck and back pain.

EXAM:
CT HEAD WITHOUT CONTRAST
CT CERVICAL SPINE WITHOUT CONTRAST
TECHNIQUE: Multidetector CT imaging of the head and cervical spine was
performed following the standard protocol without intravenous
contrast. Multiplanar CT image reconstructions of the cervical spine
were also generated.

[Series 6: coronal soft tissue · coronal · 0.31mm/px · 1 of 70 slices shown]
[im 35/70  bone]
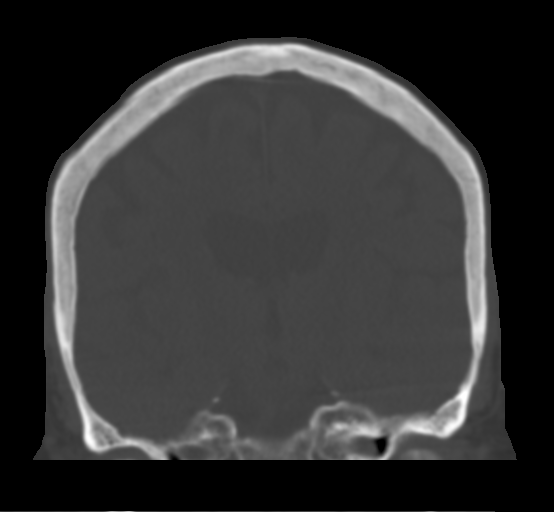

[Series 9: c spine soft · axial · 0.27mm/px · z∈[-190,-138]mm · 2 of 79 slices shown]
[im 27/79  soft-tissue]
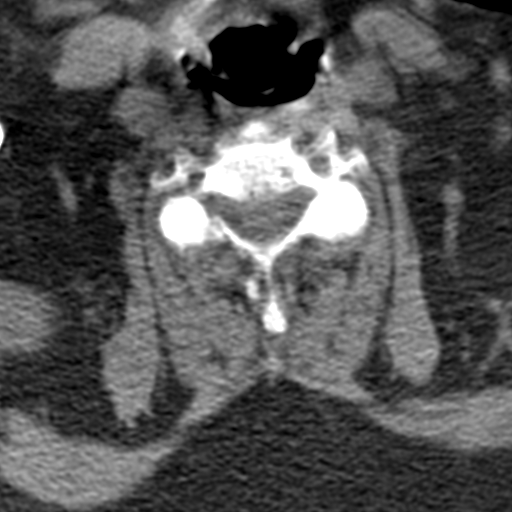
[im 53/79  soft-tissue]
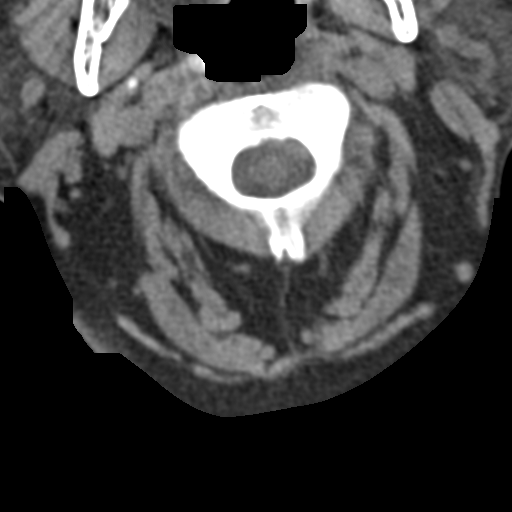

[Series 11: orthogonal bone · axial · 0.23mm/px · z∈[-236,-145]mm · 3 of 97 slices shown, 4 images]
[im 25/97  soft-tissue]
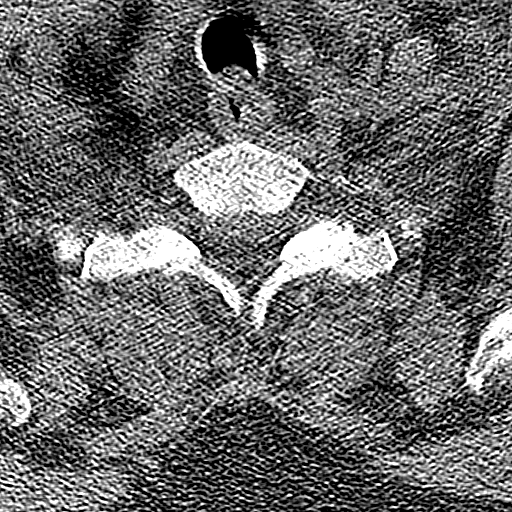
[im 25/97  bone]
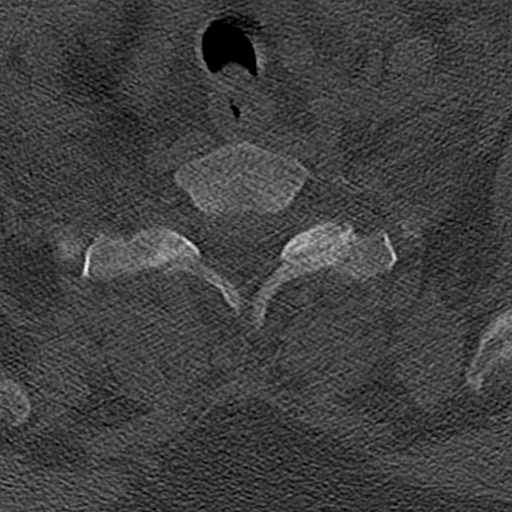
[im 49/97  bone]
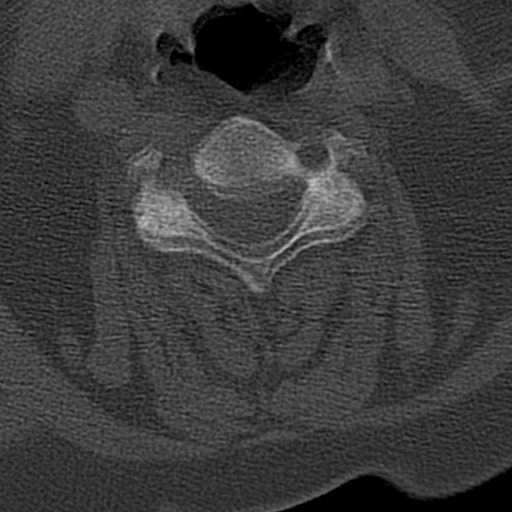
[im 73/97  bone]
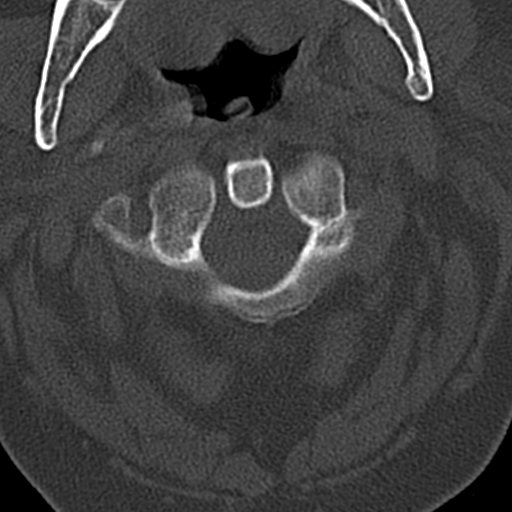

[Series 13: sagittal bone · sagittal · 0.23mm/px · 3 of 64 slices shown]
[im 16/64  bone]
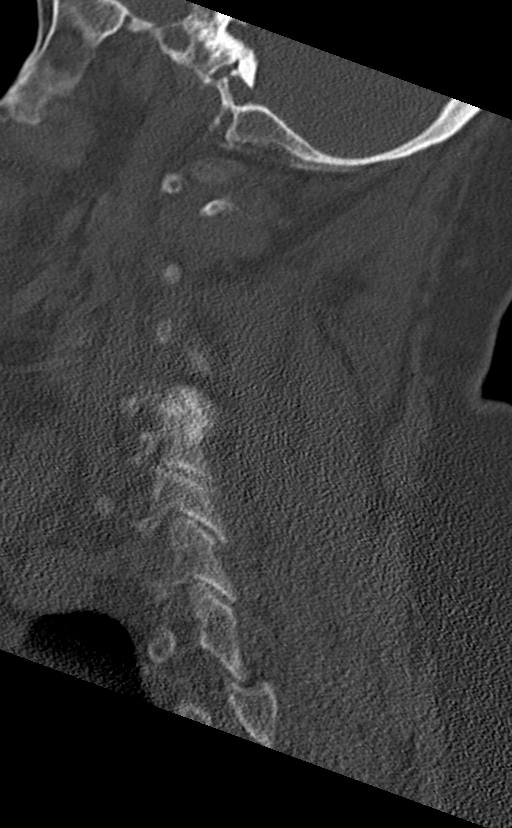
[im 32/64  bone]
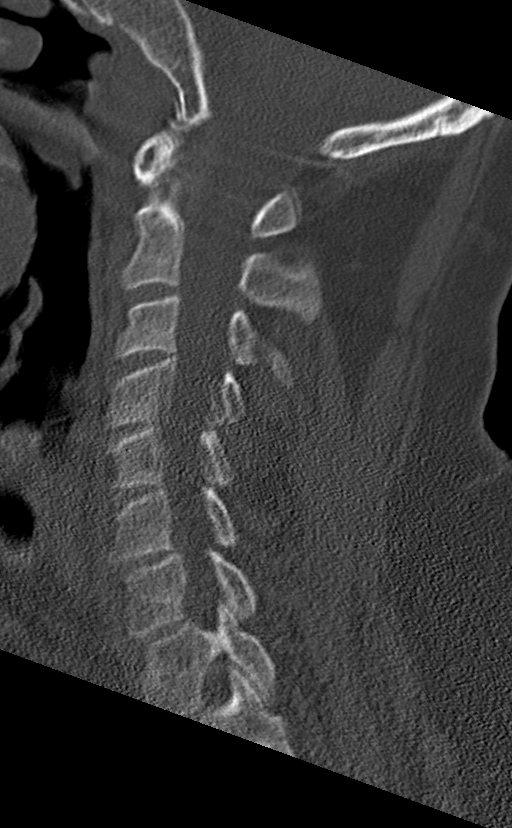
[im 48/64  bone]
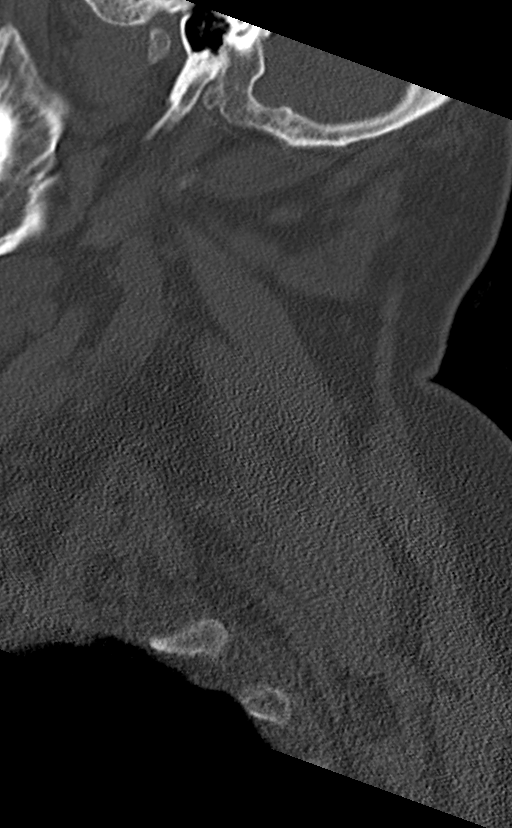

[9 of 33 positions shown; findings below may reference images not displayed]

FINDINGS: CT HEAD FINDINGS

Brain: There is no mass, hemorrhage or extra-axial collection. The
size and configuration of the ventricles and extra-axial CSF spaces
are normal. There is hypoattenuation of the periventricular white
matter, most commonly indicating chronic ischemic microangiopathy.

Vascular: No abnormal hyperdensity of the major intracranial
arteries or dural venous sinuses. No intracranial atherosclerosis.

Skull: The visualized skull base, calvarium and extracranial soft
tissues are normal.

Sinuses/Orbits: No fluid levels or advanced mucosal thickening of
the visualized paranasal sinuses. No mastoid or middle ear effusion.
The orbits are normal.

CT CERVICAL SPINE FINDINGS

Alignment: No static subluxation. Facets are aligned. Occipital
condyles are normally positioned.

Skull base and vertebrae: No acute fracture.

Soft tissues and spinal canal: No prevertebral fluid or swelling. No
visible canal hematoma.

Disc levels: No advanced spinal canal or neural foraminal stenosis.

Upper chest: No pneumothorax, pulmonary nodule or pleural effusion.

Other: Normal visualized paraspinal cervical soft tissues.
IMPRESSION: 1. No acute intracranial abnormality.
2. No acute fracture or static subluxation of the cervical spine.

## 2021-03-06 IMAGING — CT CT ABDOMEN AND PELVIS WITH CONTRAST
2 of 5 series · 16 of 46 positions shown, 18 images · IV contrast (ISOVUE)
Comparison: 02/28/2018

CLINICAL DATA: Acute generalized abdominal pain

EXAM:
CT ABDOMEN AND PELVIS WITH CONTRAST
TECHNIQUE: Multidetector CT imaging of the abdomen and pelvis was performed
using the standard protocol following bolus administration of
intravenous contrast.
CONTRAST:  100mL OSSMD6-2MM IOPAMIDOL (OSSMD6-2MM) INJECTION 61%

[Series 2: axial st · axial · 0.72mm/px · z∈[+1028,+1403]mm · 13 of 87 slices shown, 15 images]
[im 6/87  soft-tissue]
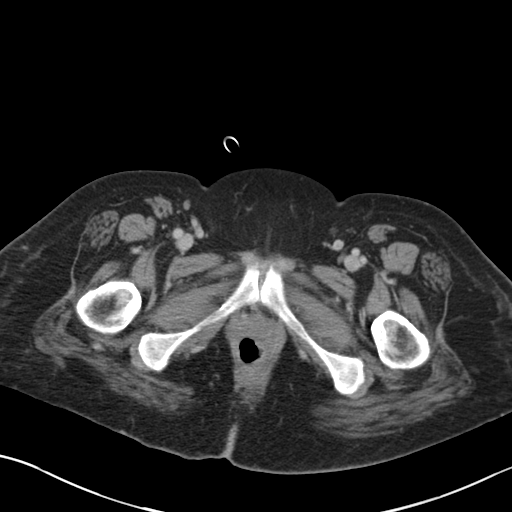
[im 6/87  bone]
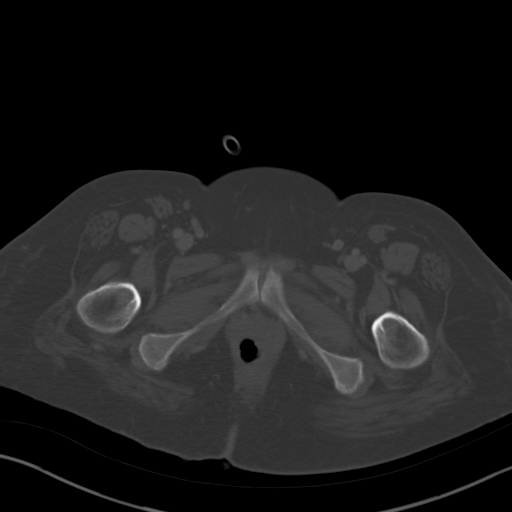
[im 11/87  soft-tissue]
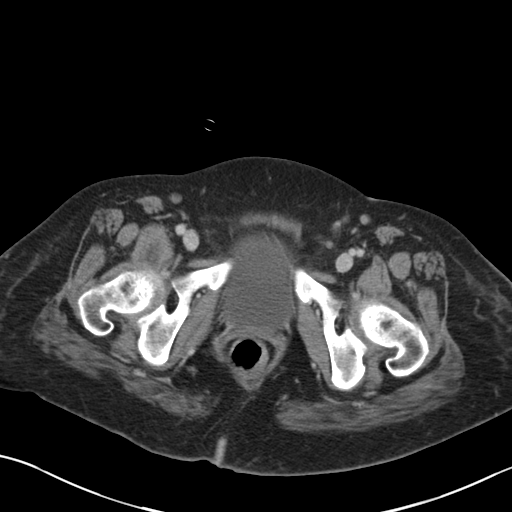
[im 17/87  soft-tissue]
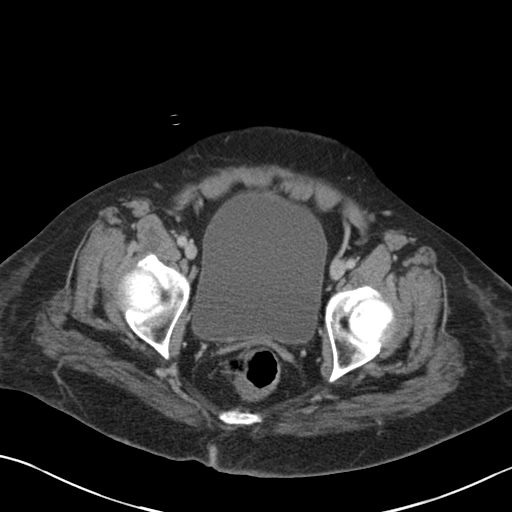
[im 27/87  soft-tissue]
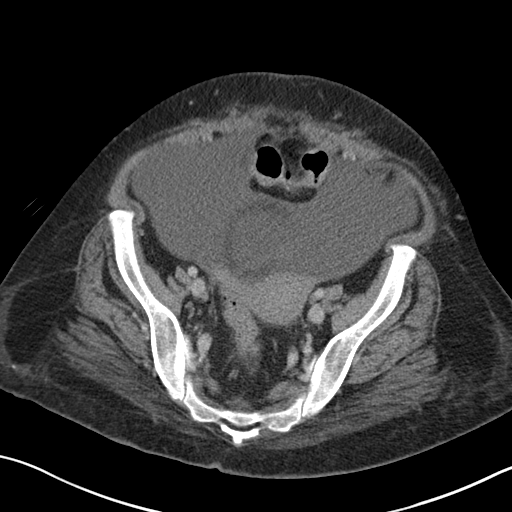
[im 33/87  soft-tissue]
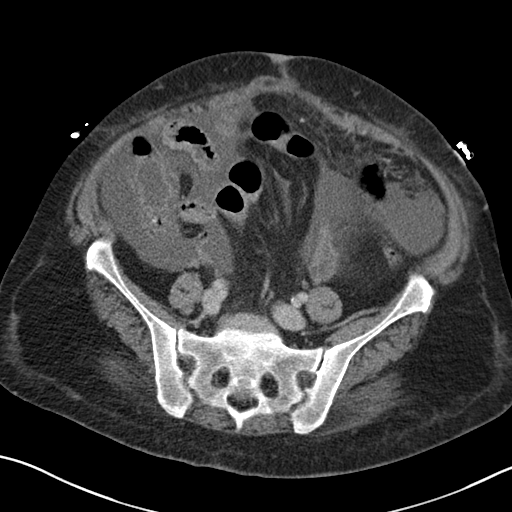
[im 38/87  soft-tissue]
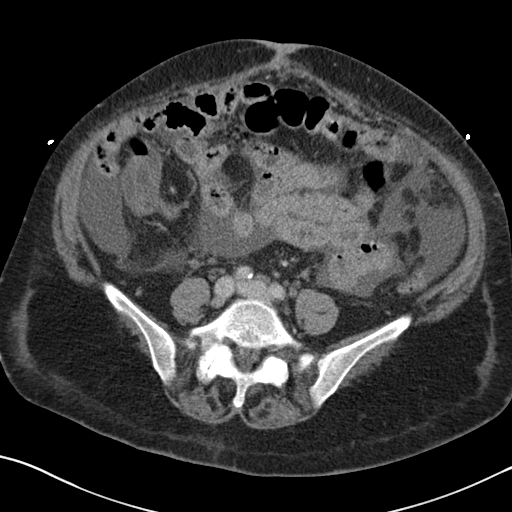
[im 44/87  soft-tissue]
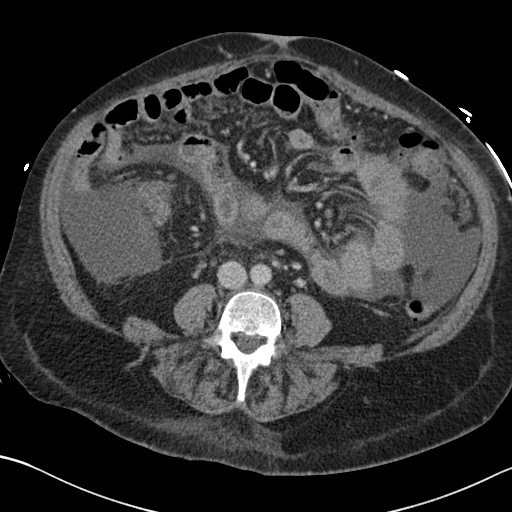
[im 49/87  soft-tissue]
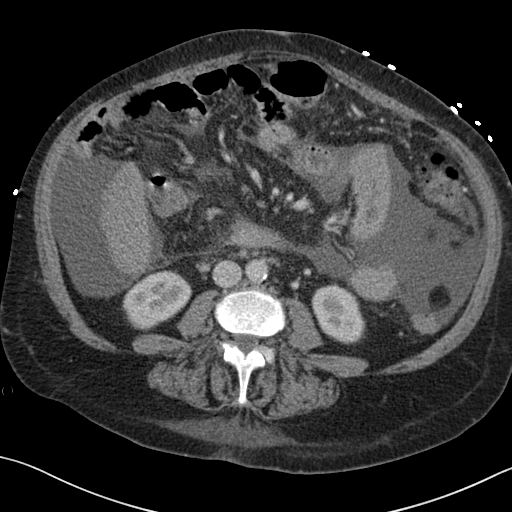
[im 54/87  soft-tissue]
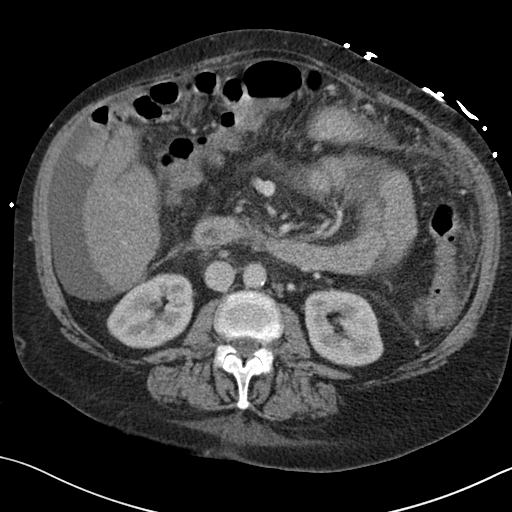
[im 54/87  bone]
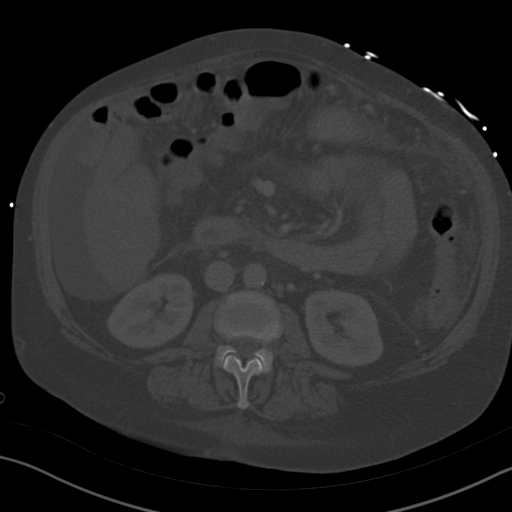
[im 60/87  soft-tissue]
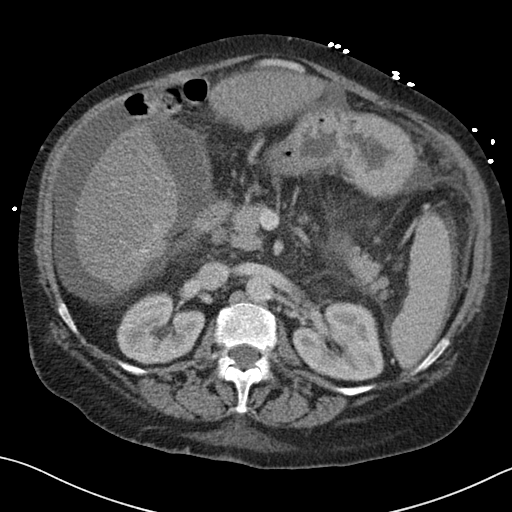
[im 70/87  soft-tissue]
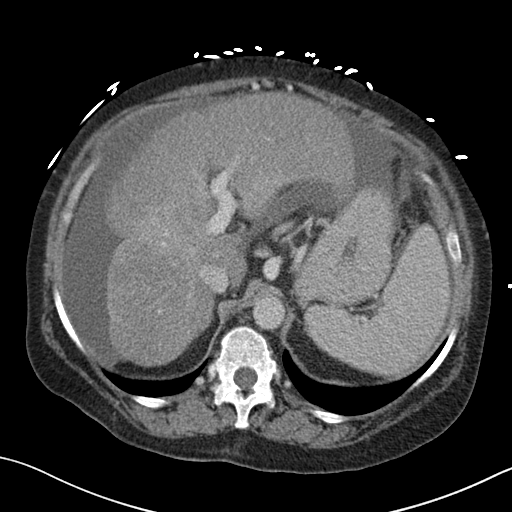
[im 76/87  soft-tissue]
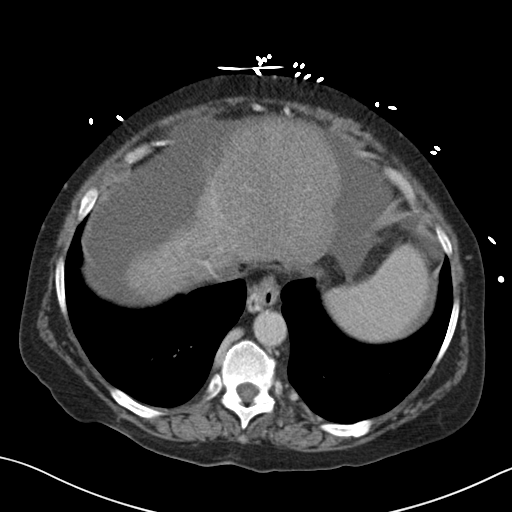
[im 81/87  soft-tissue]
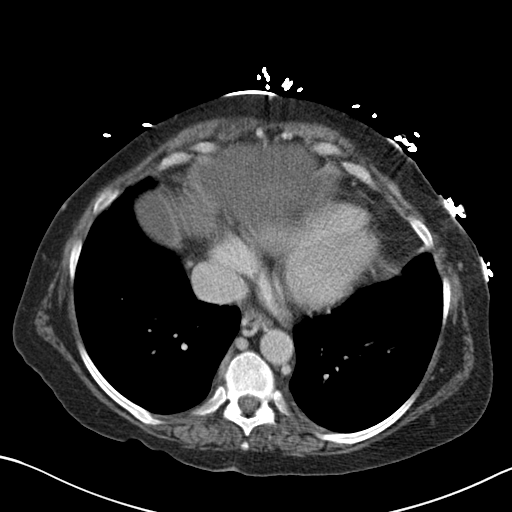

[Series 5: coronal st · coronal · 0.73mm/px · 3 of 156 slices shown]
[im 52/156  soft-tissue]
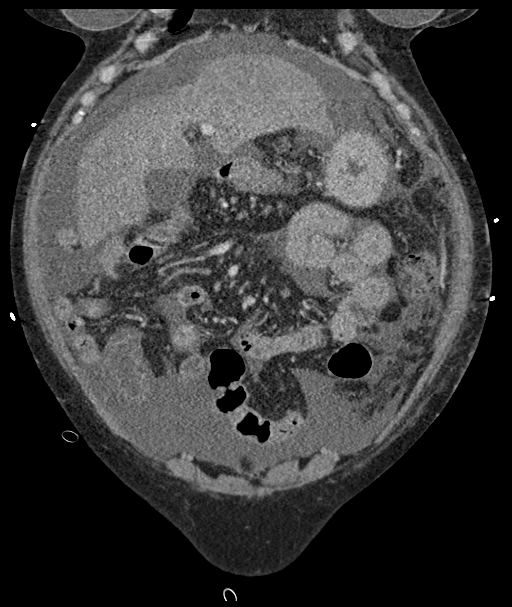
[im 69/156  soft-tissue]
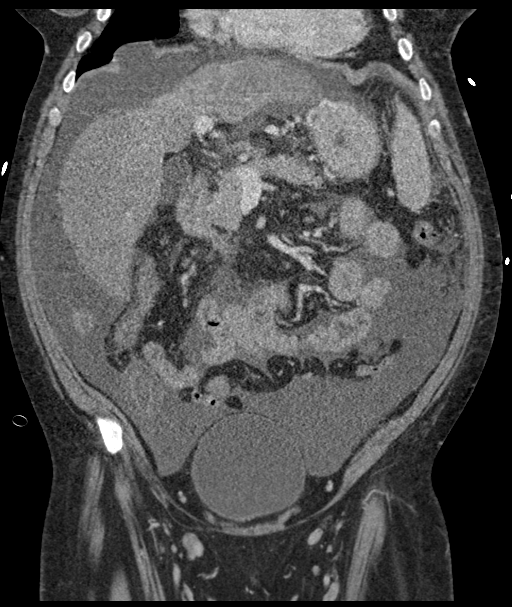
[im 87/156  soft-tissue]
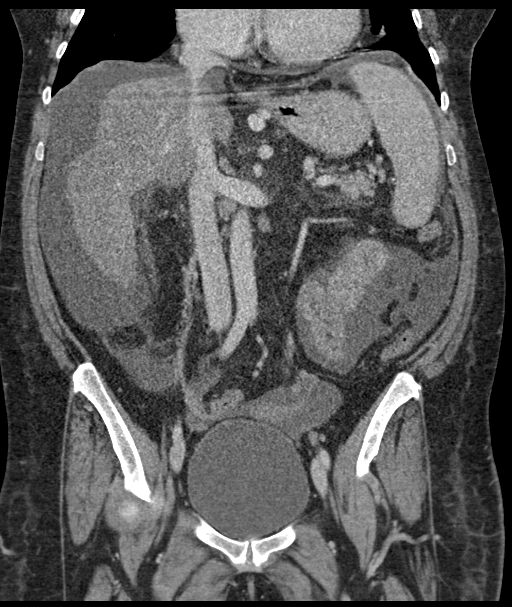

[16 of 46 positions shown; findings below may reference images not displayed]

FINDINGS: Lower chest: Partially covered bilateral breast implants. Esophageal
varices.

Hepatobiliary: Steatotic, small and lobulated liver. Recanalized
periumbilical vein. Moderate ascites without loculation. Patent main
portal vein. No masslike finding in the liver-a wispy area of
hypervascularity in the lateral segment left liver has been stable
since 2570.No evidence of biliary obstruction or stone.

Pancreas: Unremarkable.

Spleen: Mild enlargement in the setting of portal hypertension. No
acute or focal finding

Adrenals/Urinary Tract: Negative adrenals. No hydronephrosis or
stone. Unremarkable bladder.

Stomach/Bowel: No obstruction. No appendicitis. Mild, stable
submucosal thickening of the proximal colon which is likely from
portal hypertension.

Vascular/Lymphatic: Periesophageal varices. Mild atherosclerotic
calcification. No acute vascular finding. No mass or adenopathy.

Reproductive:Negative.

Other: No pneumoperitoneum

Musculoskeletal: No acute finding. Fluid is present within the right
ileus psoas bursa.
IMPRESSION: 1. No acute finding compared to priors.
2. Cirrhosis with portal hypertension, esophageal varices, and
moderate ascites.

## 2021-03-12 IMAGING — CT CT CERVICAL SPINE WITHOUT CONTRAST
5 of 10 series · 11 of 33 positions shown, 12 images · non-contrast
Comparison: CT head 04/24/2018

CLINICAL DATA: Frequent falls.  Headache.  Head injury.

EXAM:
CT HEAD WITHOUT CONTRAST
CT MAXILLOFACIAL WITHOUT CONTRAST
CT CERVICAL SPINE WITHOUT CONTRAST
TECHNIQUE: Multidetector CT imaging of the head, cervical spine, and
maxillofacial structures were performed using the standard protocol
without intravenous contrast. Multiplanar CT image reconstructions
of the cervical spine and maxillofacial structures were also
generated.

[Series 6: max soft · axial · 0.34mm/px · z∈[+810,+858]mm · 2 of 72 slices shown]
[im 24/72  soft-tissue]
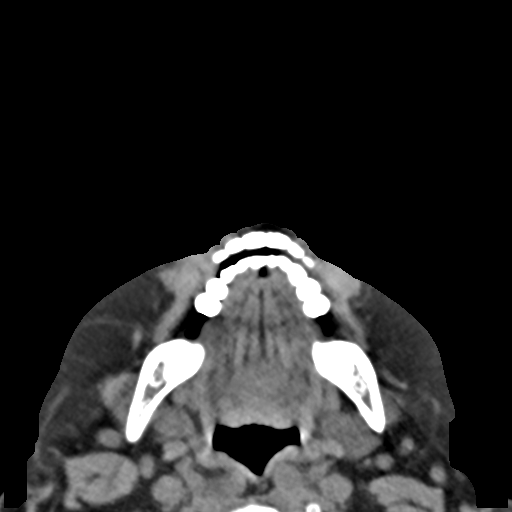
[im 48/72  soft-tissue]
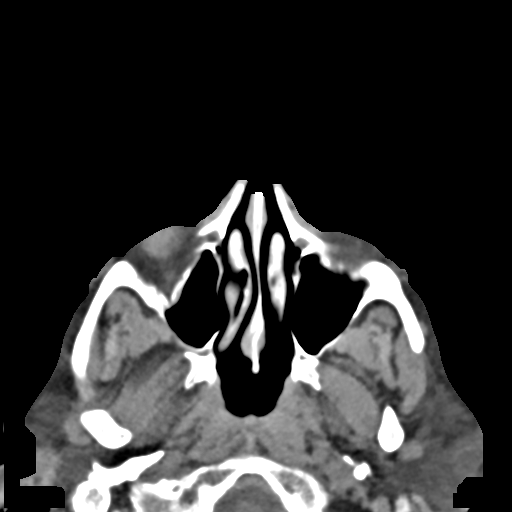

[Series 15: c spine soft · axial · 0.33mm/px · z∈[+776,+824]mm · 2 of 74 slices shown]
[im 25/74  soft-tissue]
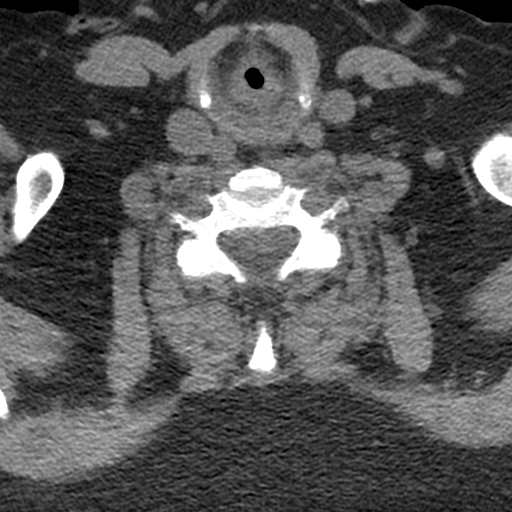
[im 49/74  soft-tissue]
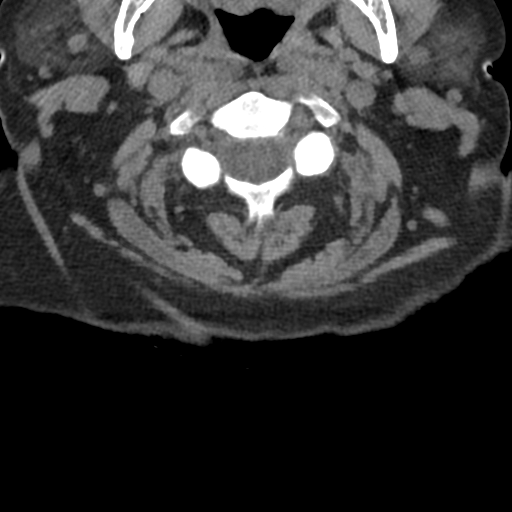

[Series 16: sagittal bone · sagittal · 0.32mm/px · 3 of 93 slices shown]
[im 24/93  bone]
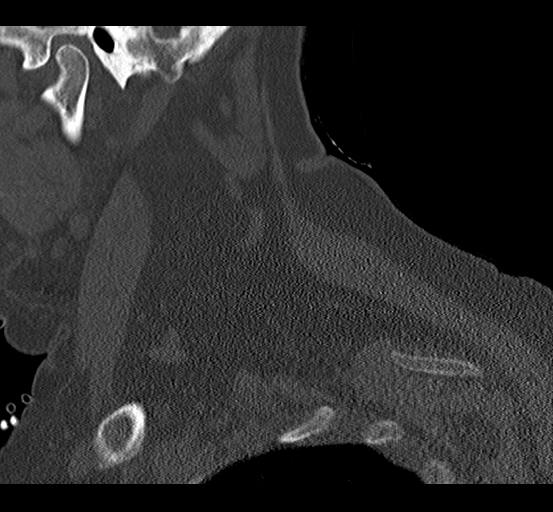
[im 47/93  bone]
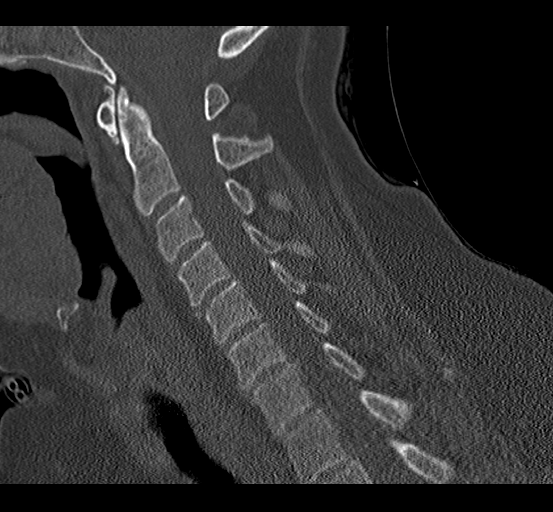
[im 70/93  bone]
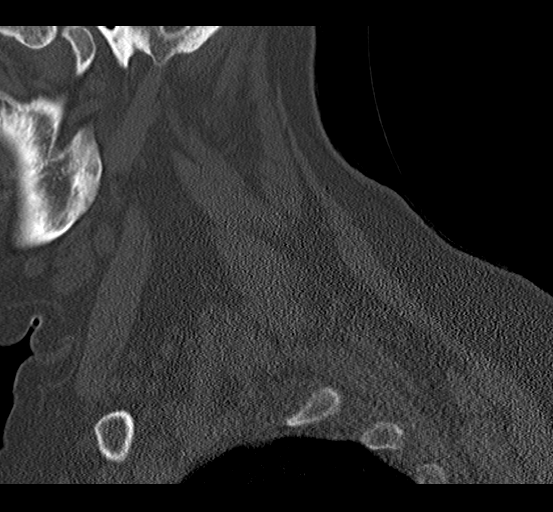

[Series 17: coronal bone · coronal · 0.34mm/px · 1 of 81 slices shown]
[im 63/81  bone]
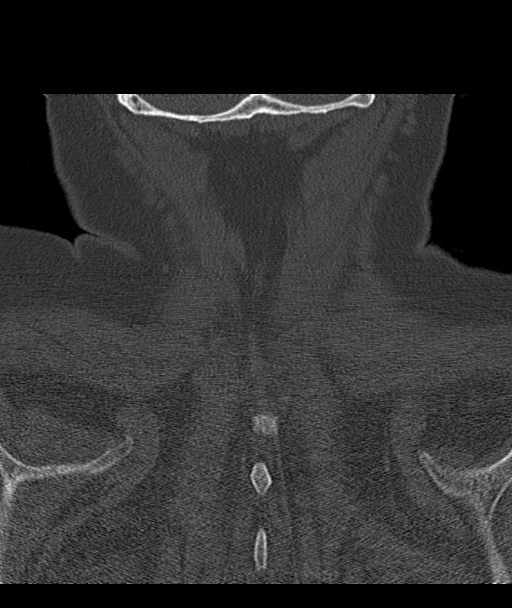

[Series 18: orthogonal axials · axial · 0.29mm/px · z∈[+706,+799]mm · 3 of 104 slices shown, 4 images]
[im 26/104  soft-tissue]
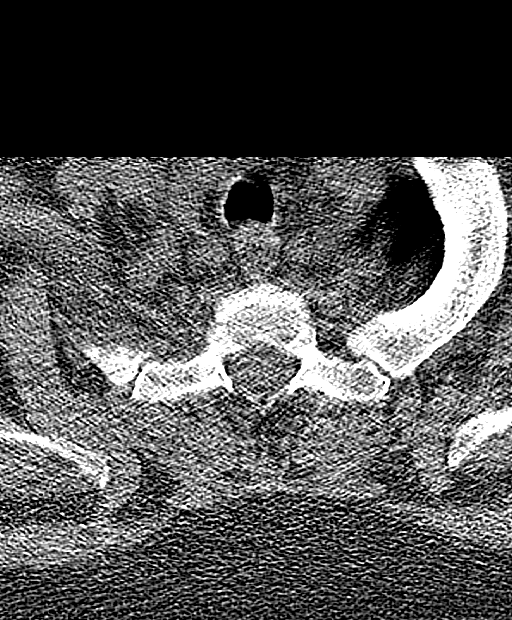
[im 26/104  bone]
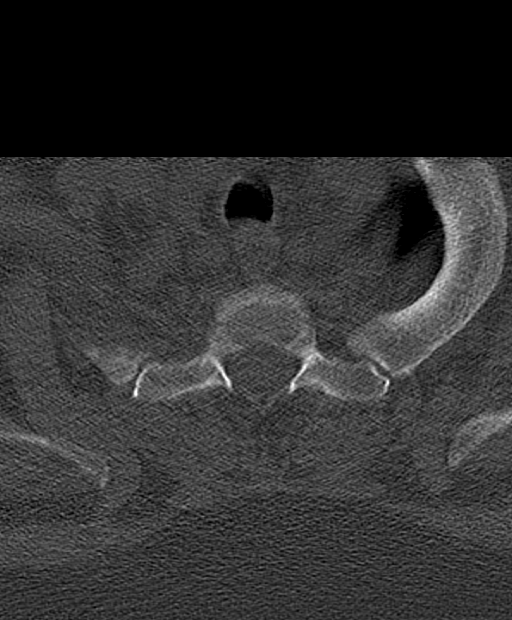
[im 52/104  bone]
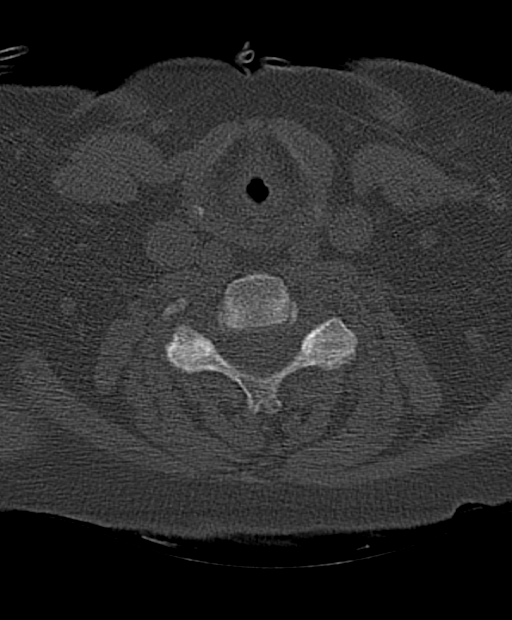
[im 78/104  bone]
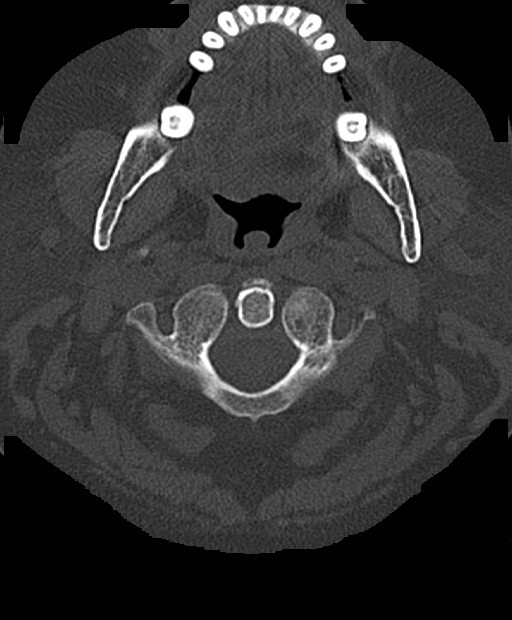

[11 of 33 positions shown; findings below may reference images not displayed]

FINDINGS: CT HEAD FINDINGS

Brain: Mild atrophy. Moderate chronic microvascular ischemic change
in the white matter. No acute infarct, hemorrhage, or mass.

Vascular: Negative for hyperdense vessel

Skull: Negative for skull fracture

Other: Right orbital floor fracture, see below

CT MAXILLOFACIAL FINDINGS

Osseous: Depressed fracture right orbital floor is unchanged and not
acute. This appeared first on the CT face of 03/19/2018
approximately 9 mm defect in the orbital floor.

No other facial fracture identified.

Orbits: No orbital mass or edema. Right orbital floor fracture as
above.

Sinuses: Paranasal sinuses clear.  No air-fluid level.

Soft tissues: No significant soft tissue contusion or mass.

CT CERVICAL SPINE FINDINGS

Alignment: Normal

Skull base and vertebrae: Negative for fracture

Soft tissues and spinal canal: Negative for mass or soft tissue
contusion

Disc levels:  Disc spaces maintained.  Mild facet degeneration.

Upper chest: Negative

Other: None
IMPRESSION: 1. No acute intracranial abnormality. Atrophy and chronic
microvascular ischemia.
2. Nonacute depressed fracture right orbital floor unchanged from
prior studies. Paranasal sinuses clear. No acute fracture
3. Negative for cervical spine fracture.

## 2021-03-12 IMAGING — CR LUMBAR SPINE - COMPLETE 4+ VIEW
5 series · 5 of 5 positions shown · non-contrast
Comparison: CT scan of April 27, 2018.

CLINICAL DATA: Acute low back pain after fall several days ago.

EXAM:
LUMBAR SPINE - COMPLETE 4+ VIEW

[t l-spine a.p.]
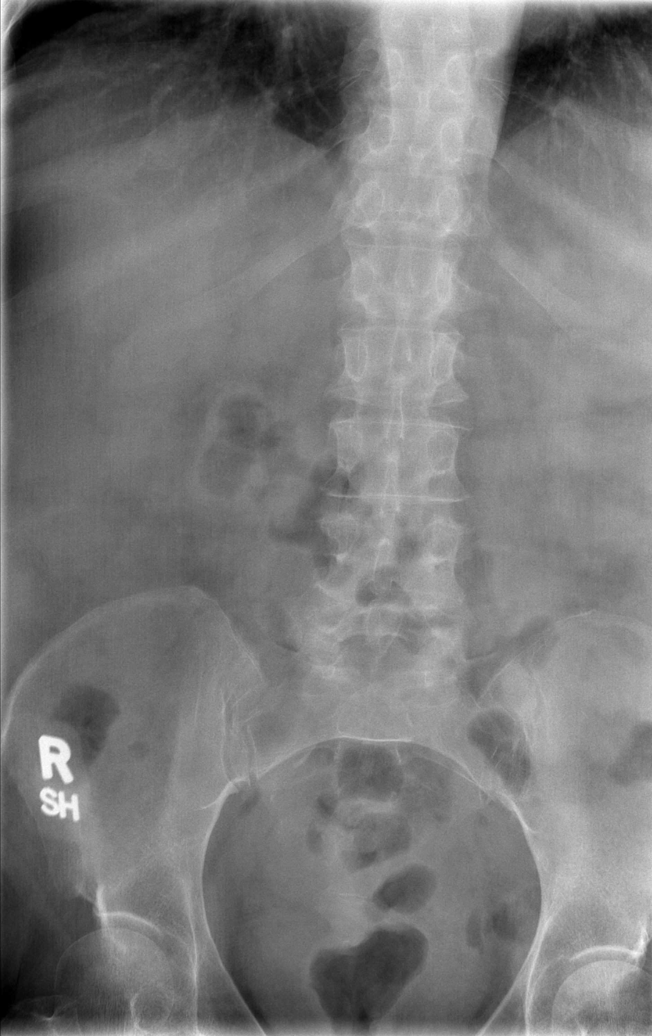

[t l-spine oblique exposure (1 of 2)]
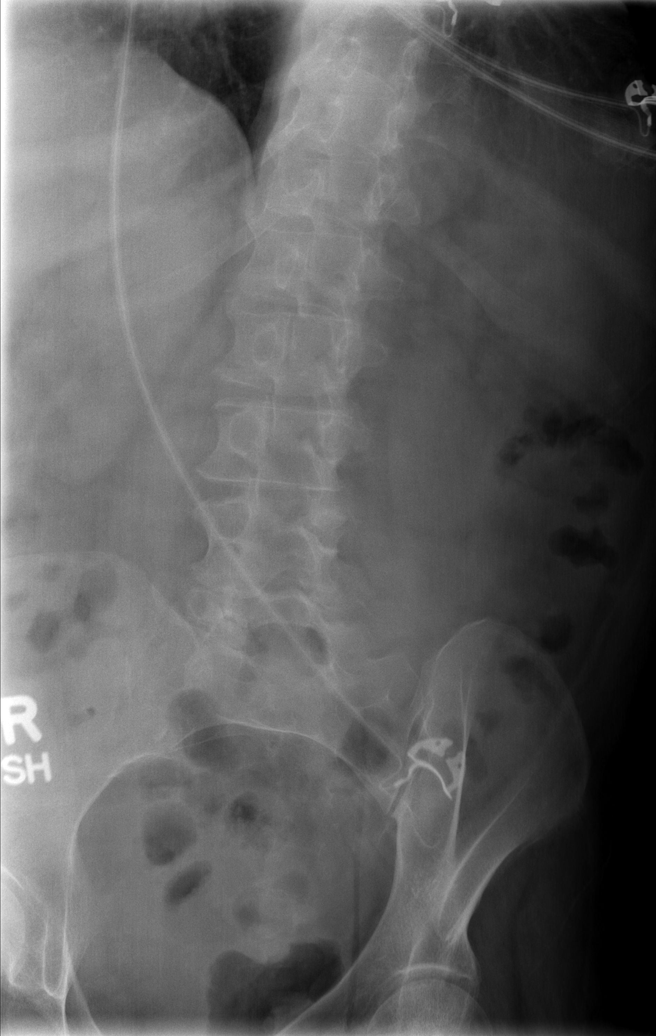

[t l-spine oblique exposure (2 of 2)]
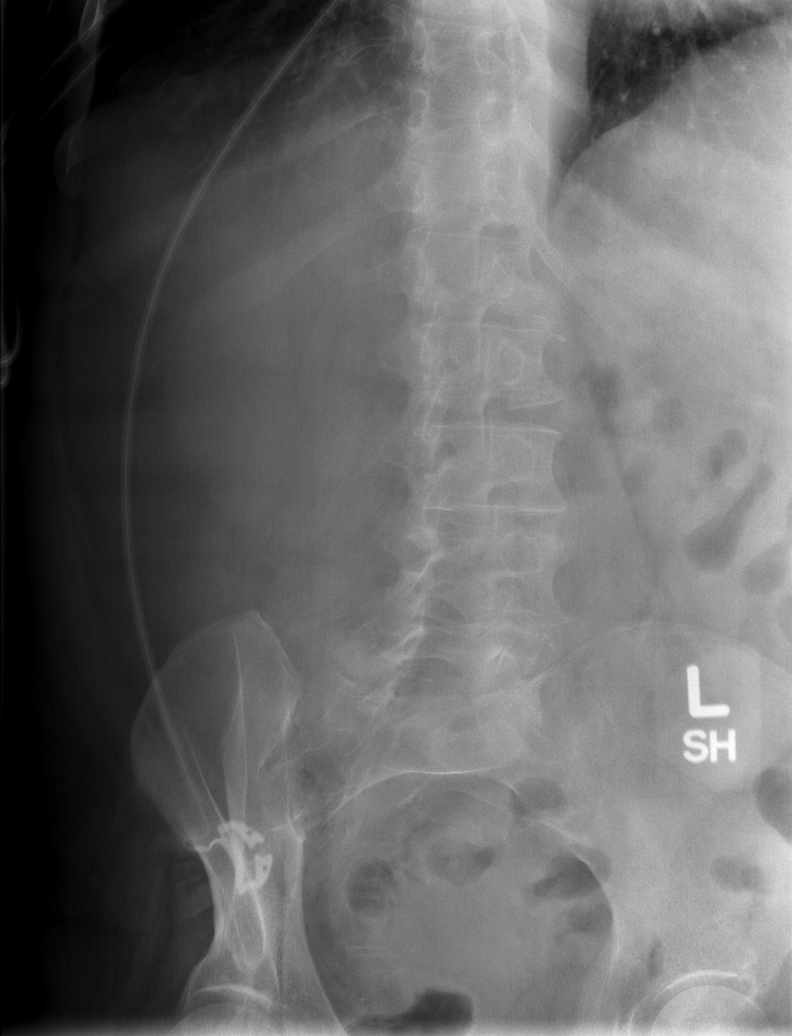

[t l-spine lat]
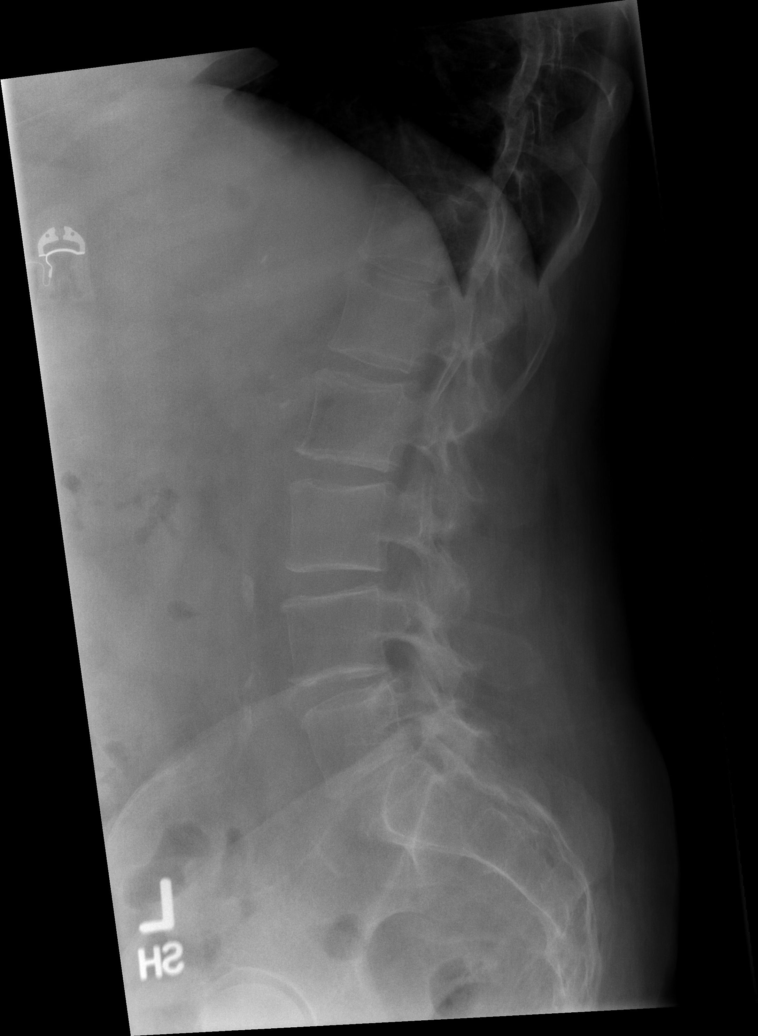

[t l-spine l5-s1 spot]
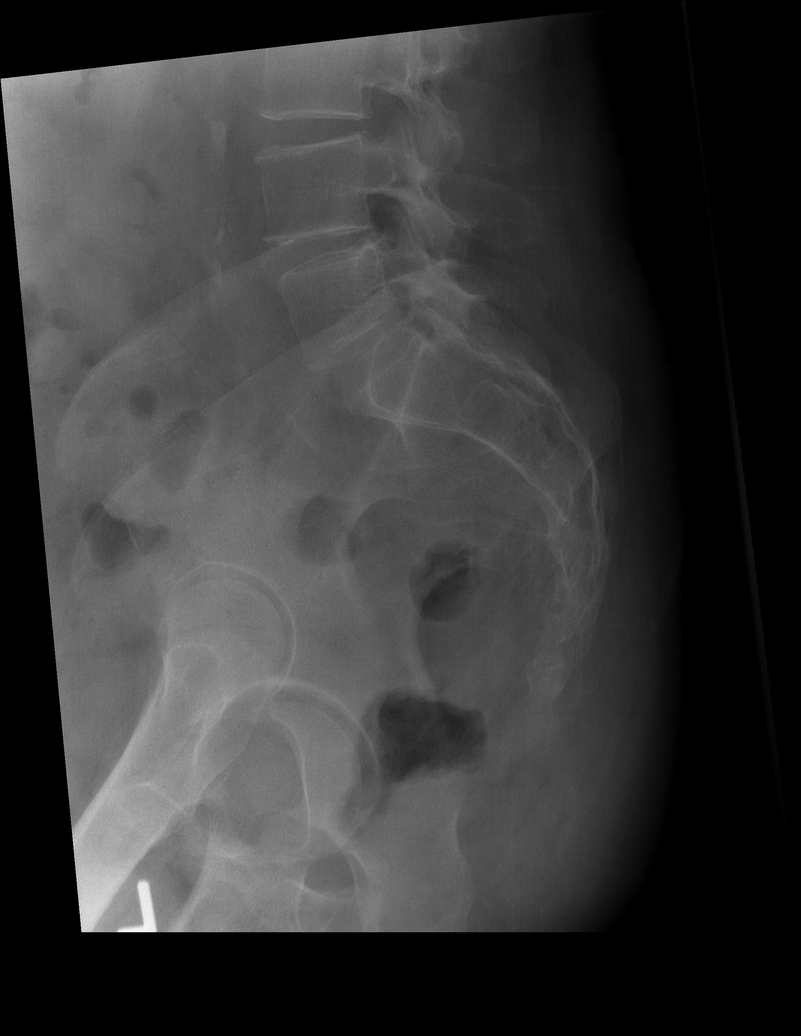

[5 of 5 positions shown; findings below may reference images not displayed]

FINDINGS: There is no evidence of lumbar spine fracture. Alignment is normal.
Intervertebral disc spaces are maintained.
IMPRESSION: Negative.

## 2021-03-14 IMAGING — CR DG ABDOMEN ACUTE W/ 1V CHEST
4 series · 4 of 4 positions shown · non-contrast
Comparison: CT scan April 27, 2018.

CLINICAL DATA: Vomiting, acute generalized abdominal pain.

EXAM:
DG ABDOMEN ACUTE W/ 1V CHEST

[x chest ap]
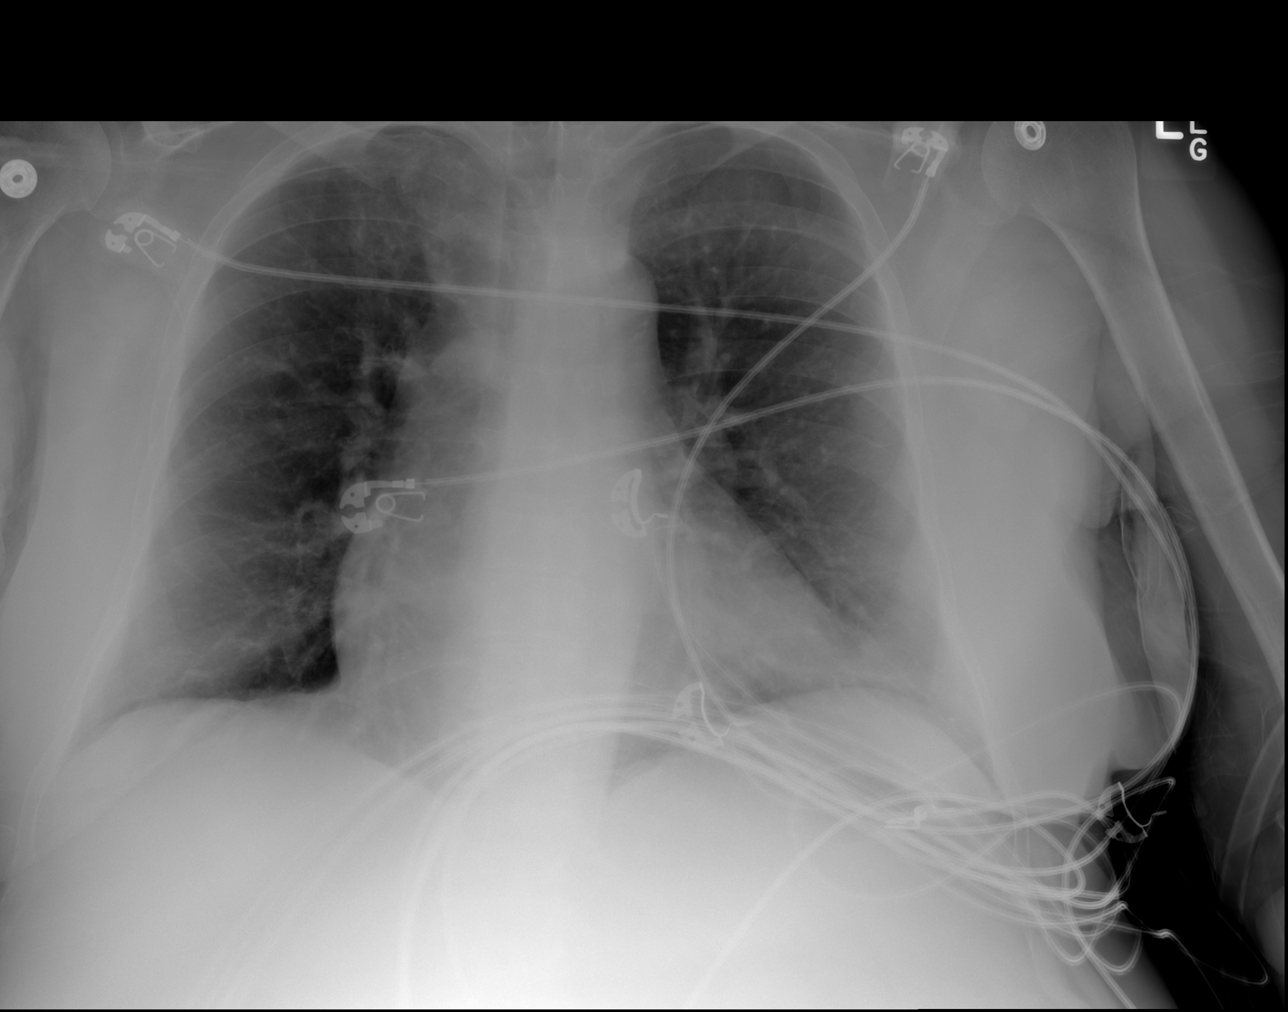

[x abdomen supine (1 of 2)]
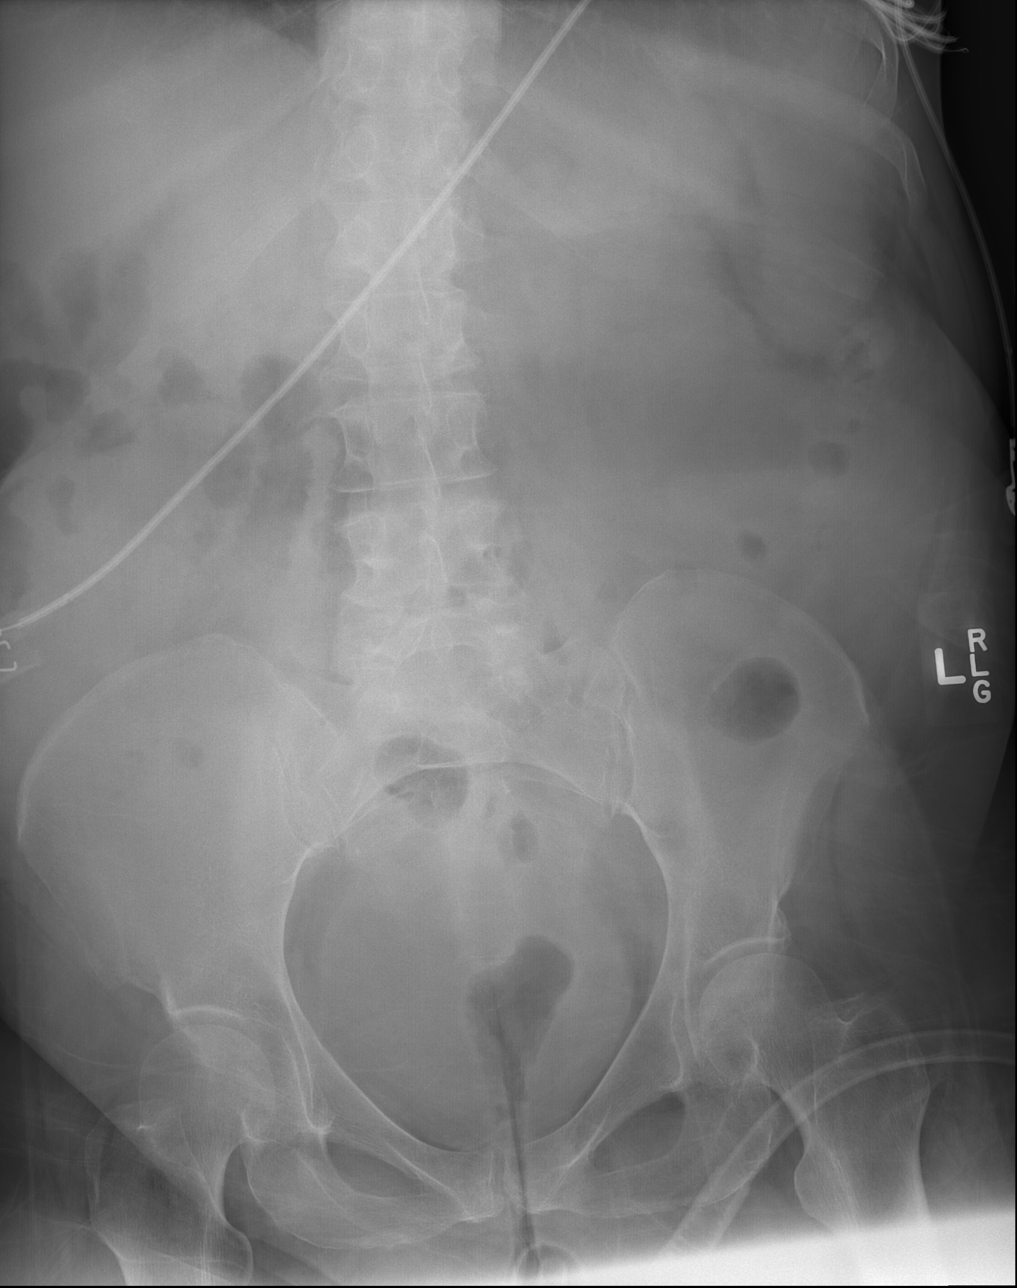

[x abdomen supine (2 of 2)]
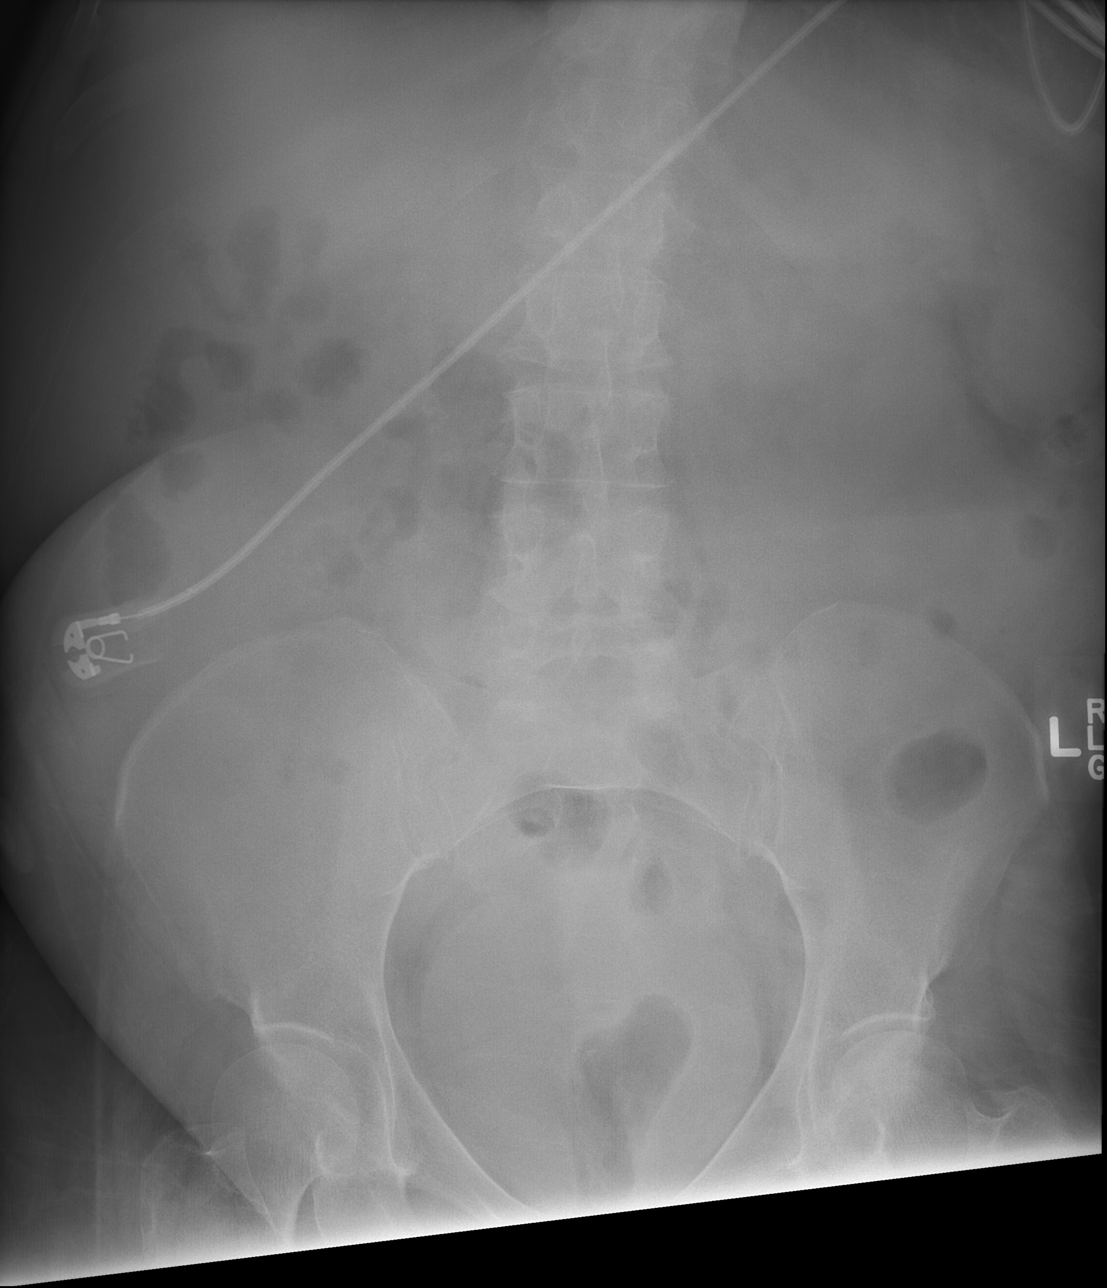

[w abdomen decub]
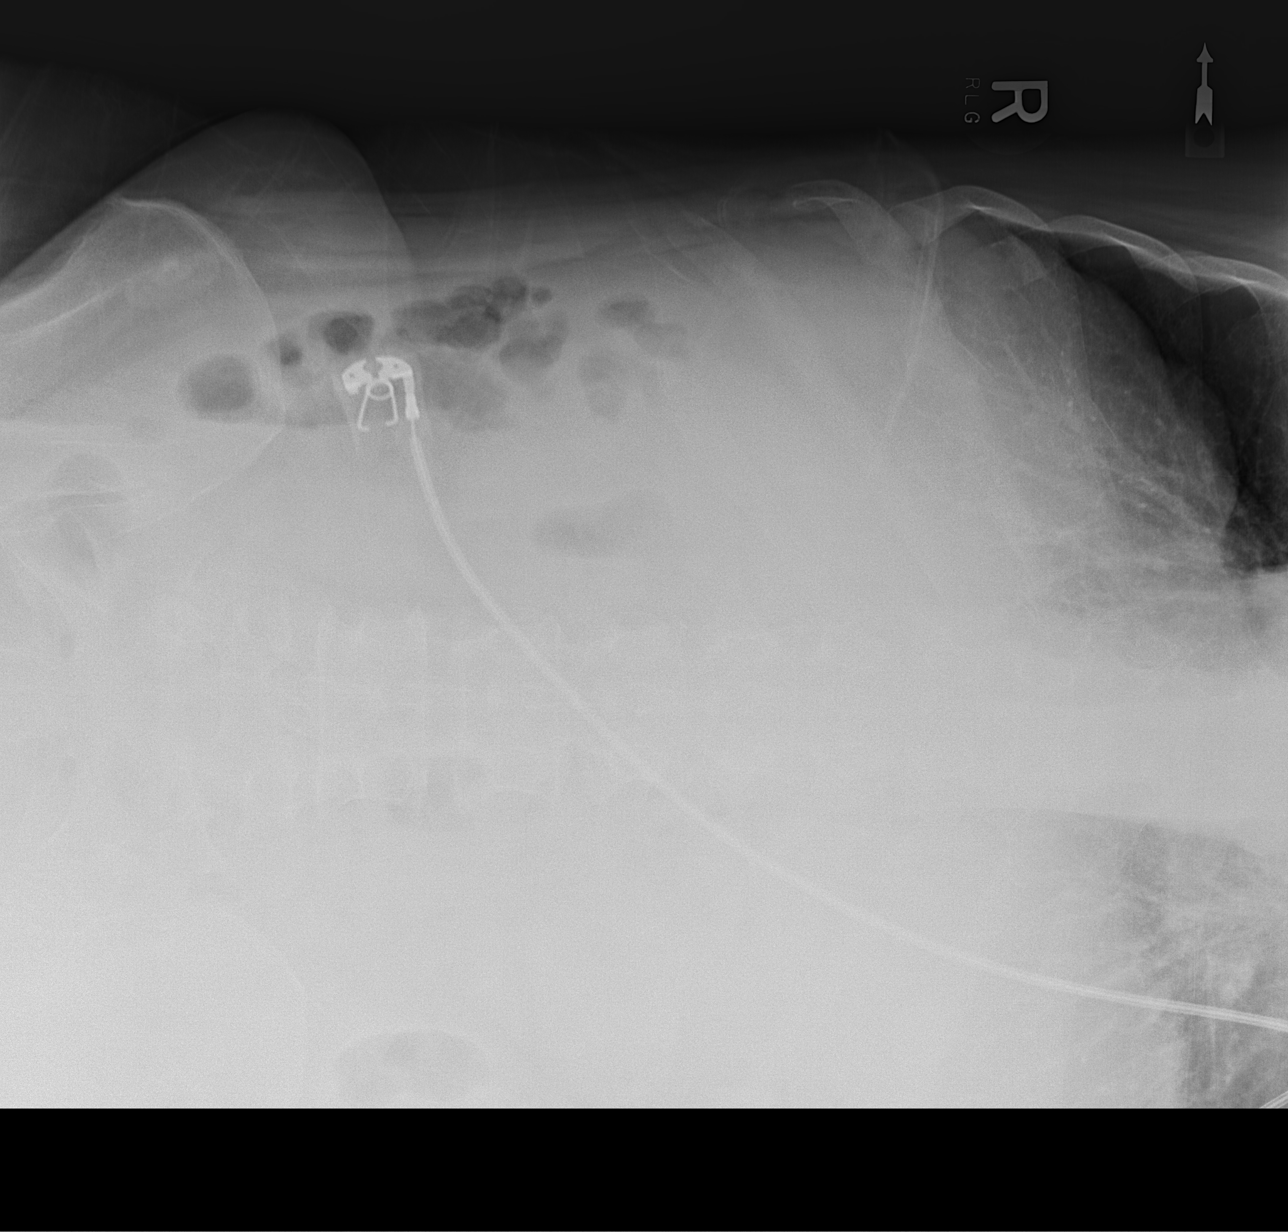

[4 of 4 positions shown; findings below may reference images not displayed]

FINDINGS: There is no evidence of dilated bowel loops or free intraperitoneal
air. No radiopaque calculi or other significant radiographic
abnormality is seen. Mild cardiomegaly is noted. Both lungs are
clear.
IMPRESSION: No evidence of bowel obstruction or ileus. No acute cardiopulmonary
disease.

## 2021-03-18 IMAGING — CT CT HEAD WITHOUT CONTRAST
3 series · 14 of 47 positions shown, 16 images · non-contrast
Comparison: Recent prior head CT 05/03/2018

CLINICAL DATA: 57-year-old female with nausea and headache after
drinking a bottle of vodka.

EXAM:
CT HEAD WITHOUT CONTRAST
TECHNIQUE: Contiguous axial images were obtained from the base of the skull
through the vertex without intravenous contrast.

[Series 2: head wo · axial · 0.47mm/px · z∈[-116,+14]mm · 8 of 32 slices shown, 10 images]
[im 3/32  brain]
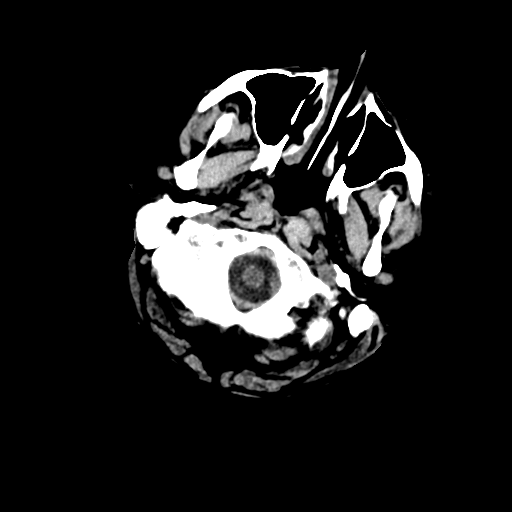
[im 3/32  bone]
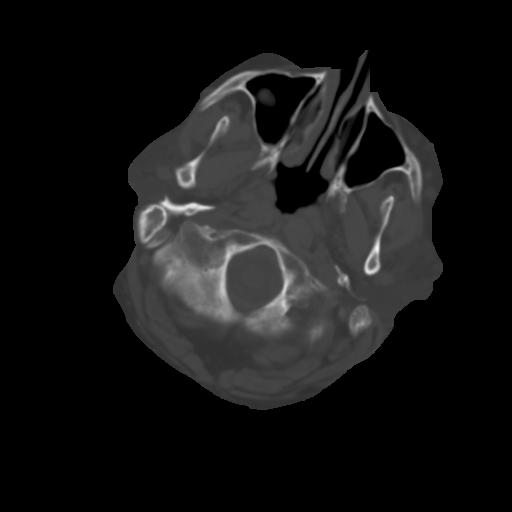
[im 7/32  brain]
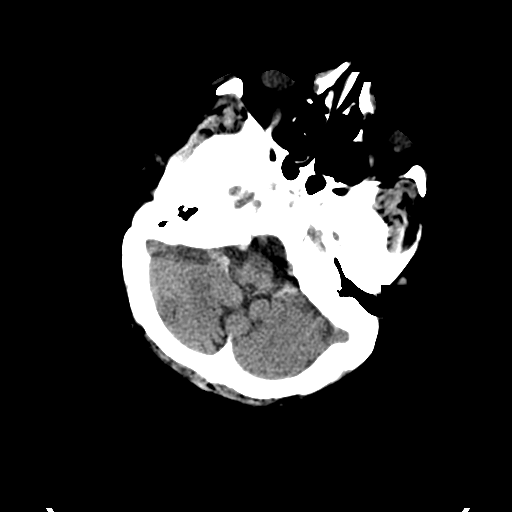
[im 10/32  brain]
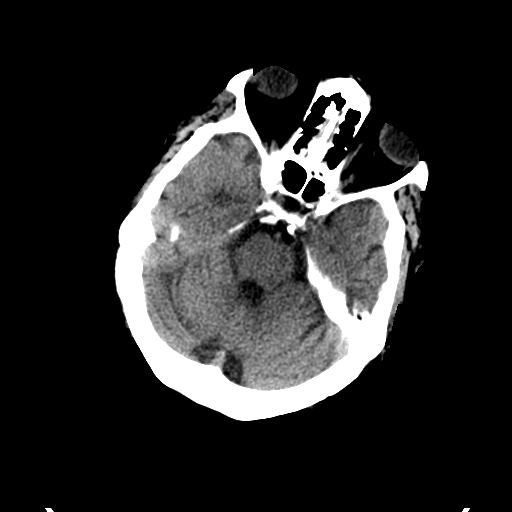
[im 14/32  brain]
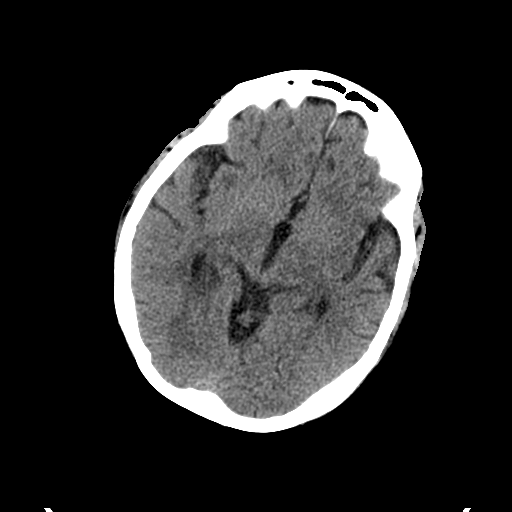
[im 18/32  brain]
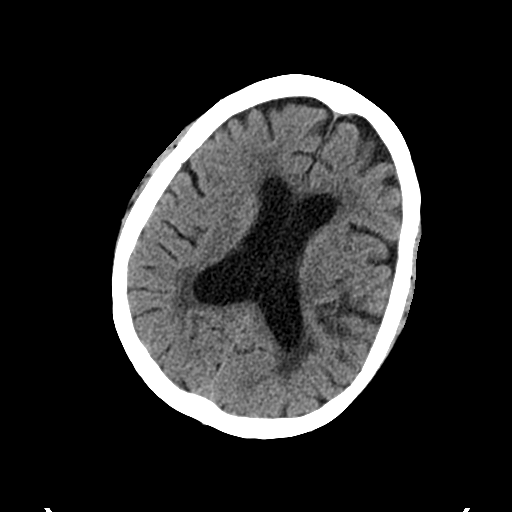
[im 18/32  bone]
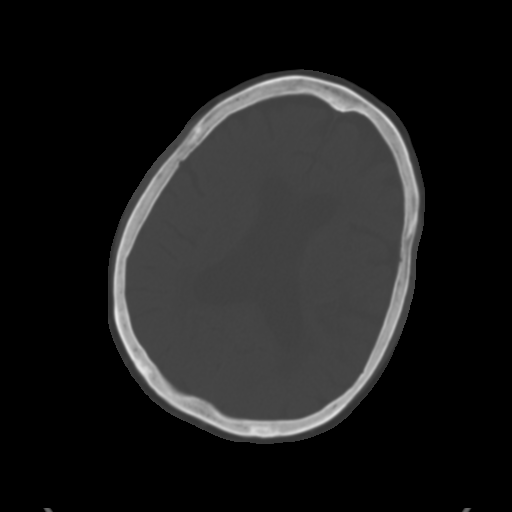
[im 22/32  brain]
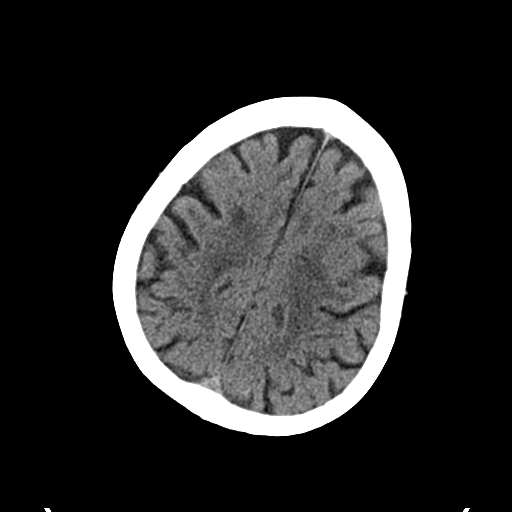
[im 25/32  brain]
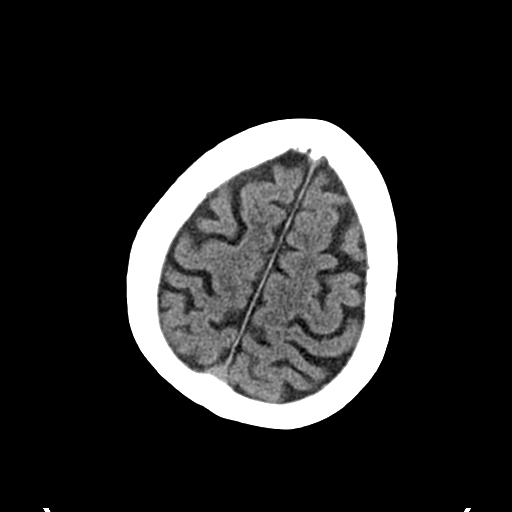
[im 29/32  brain]
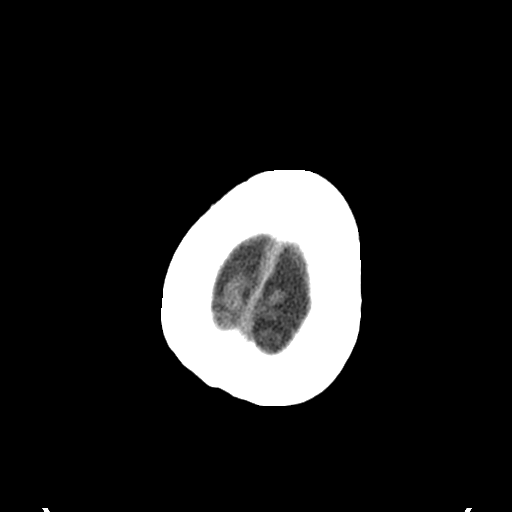

[Series 4: coronal soft tissue · coronal · 0.42mm/px · 3 of 70 slices shown]
[im 24/70  brain]
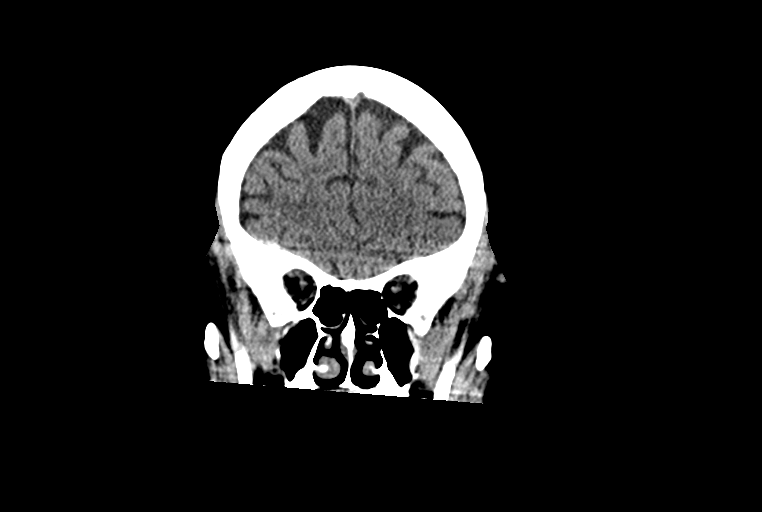
[im 31/70  brain]
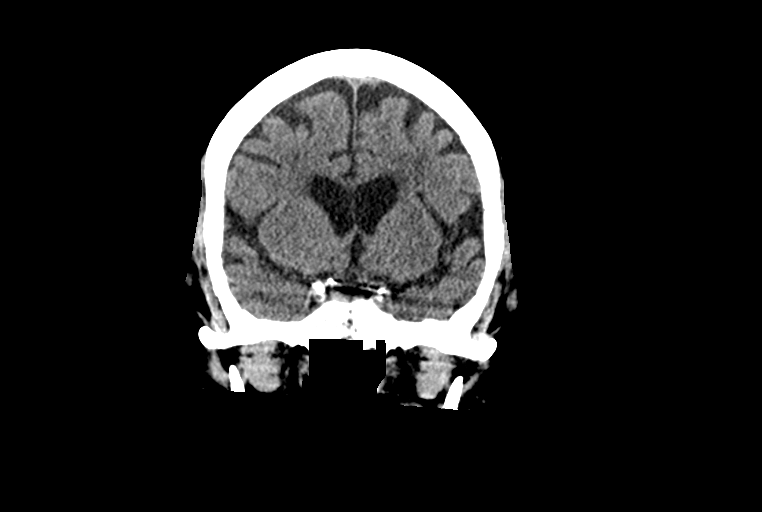
[im 39/70  brain]
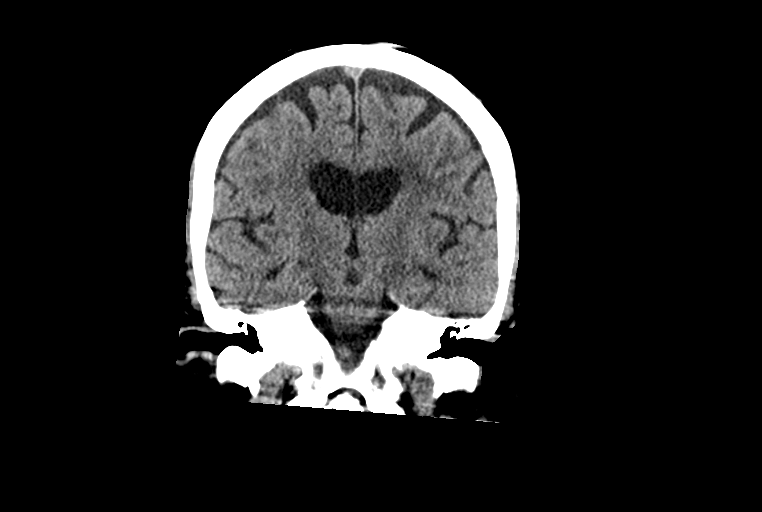

[Series 5: sagittal soft tissue · sagittal · 0.45mm/px · 3 of 51 slices shown]
[im 17/51  brain]
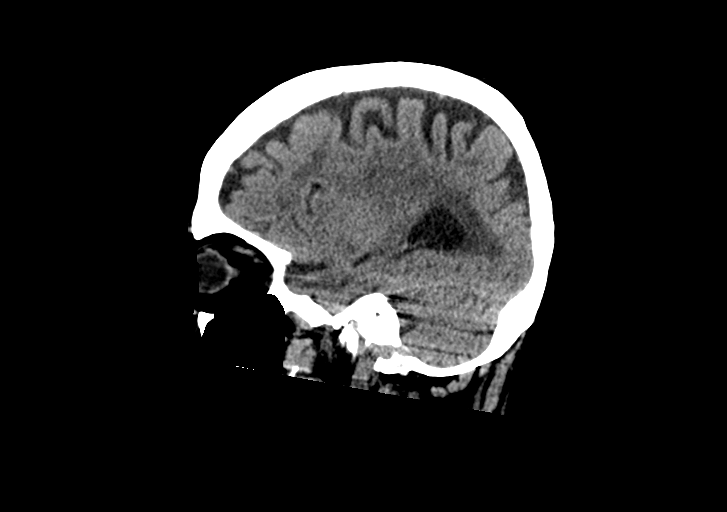
[im 26/51  brain]
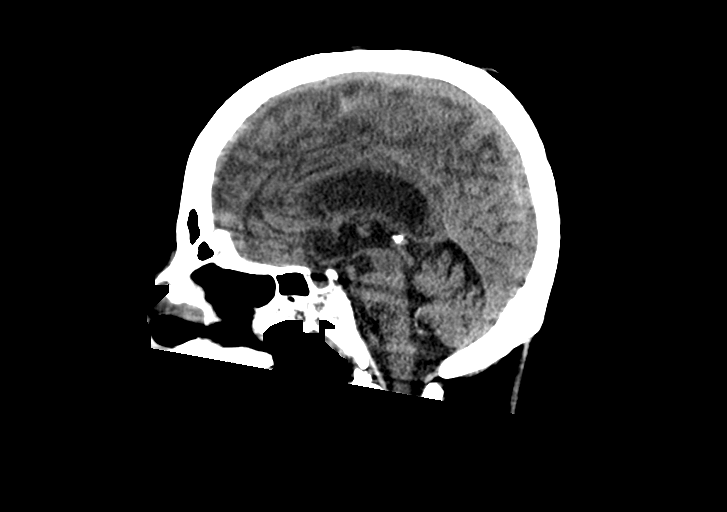
[im 34/51  brain]
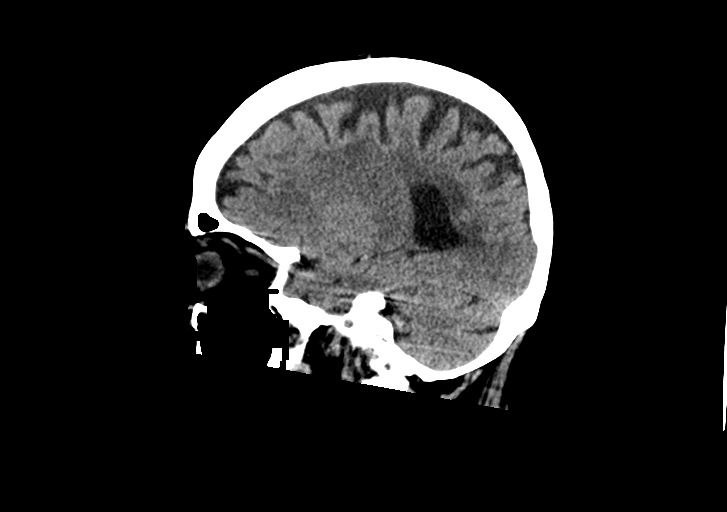

[14 of 47 positions shown; findings below may reference images not displayed]

FINDINGS: Brain: No evidence of acute infarction, hemorrhage, hydrocephalus,
extra-axial collection or mass lesion/mass effect. Similar
appearance of age advanced cortical volume loss and periventricular,
subcortical and deep white matter hypoattenuation consistent with
chronic microvascular ischemic white matter disease.

Vascular: No hyperdense vessel or unexpected calcification.

Skull: Normal. Negative for fracture or focal lesion.

Sinuses/Orbits: No acute finding. Stable depressed right orbital
floor fracture with associated mucous retention cyst.

Other: None.
IMPRESSION: 1. No acute intracranial abnormality.
2. Age advanced cerebral cortical volume loss.
3. Moderate chronic microvascular ischemic white matter disease.

## 2021-03-18 IMAGING — CR CHEST - 2 VIEW
2 series · 2 of 2 positions shown · non-contrast
Comparison: 05/05/2018

CLINICAL DATA: Nausea, vomiting

EXAM:
CHEST - 2 VIEW

[w chest lat]
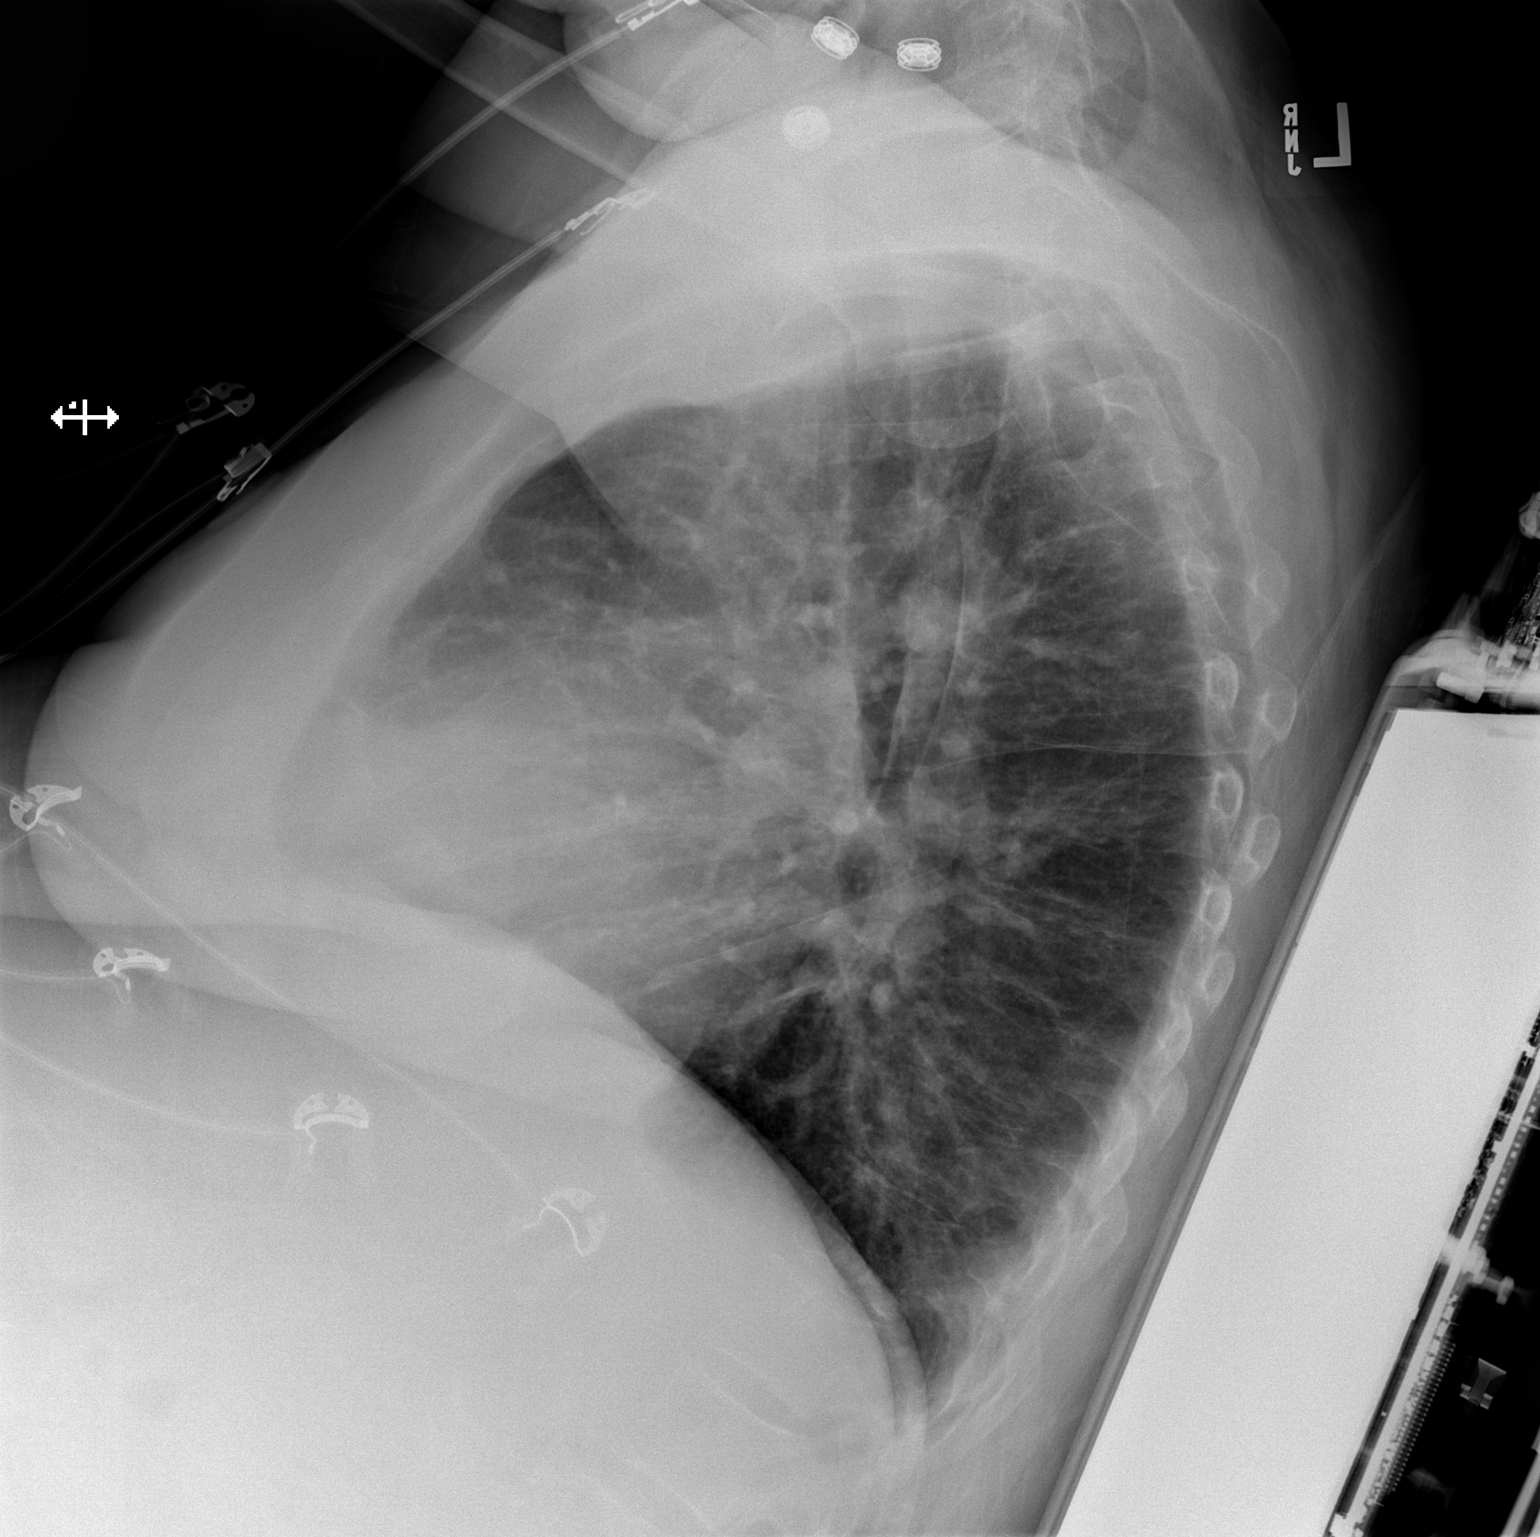

[x chest ap]
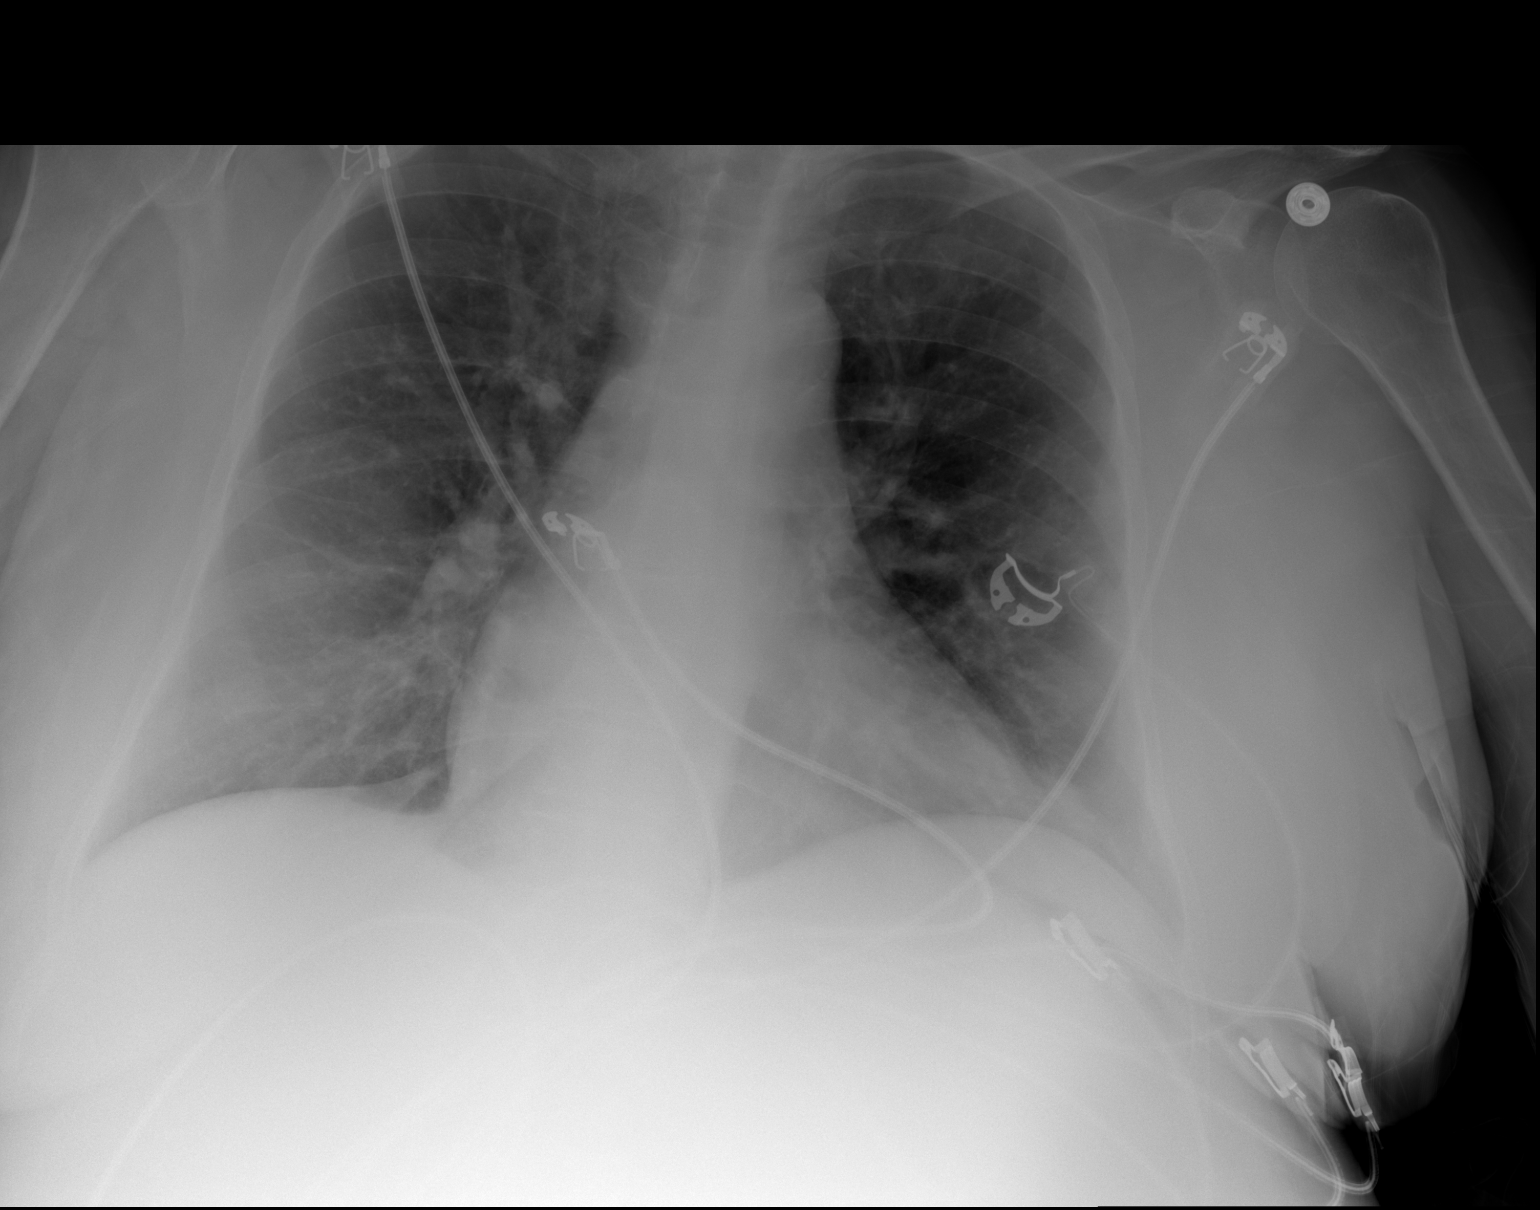

[2 of 2 positions shown; findings below may reference images not displayed]

FINDINGS: Borderline heart size. No confluent airspace opacities or effusions.
No acute bony abnormality.
IMPRESSION: No active cardiopulmonary disease.

## 2021-03-21 IMAGING — CT CT HEAD WITHOUT CONTRAST
3 series · 15 of 47 positions shown, 18 images · non-contrast
Comparison: 05/09/2018

CLINICAL DATA: Head trauma, unwitnessed fall

EXAM:
CT HEAD WITHOUT CONTRAST
TECHNIQUE: Contiguous axial images were obtained from the base of the skull
through the vertex without intravenous contrast.

[Series 2: head wo · axial · 0.47mm/px · z∈[-169,-44]mm · 9 of 31 slices shown, 12 images]
[im 3/31  brain]
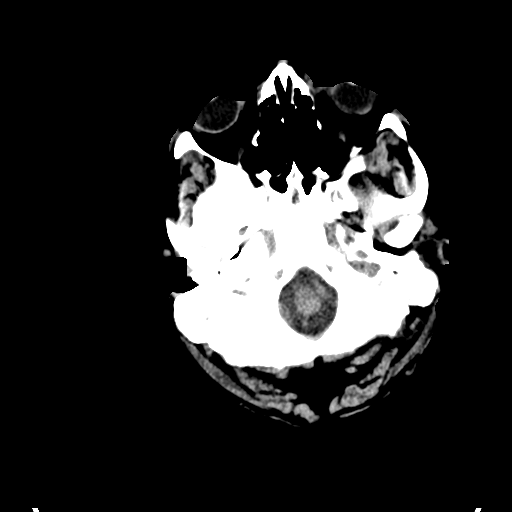
[im 3/31  bone]
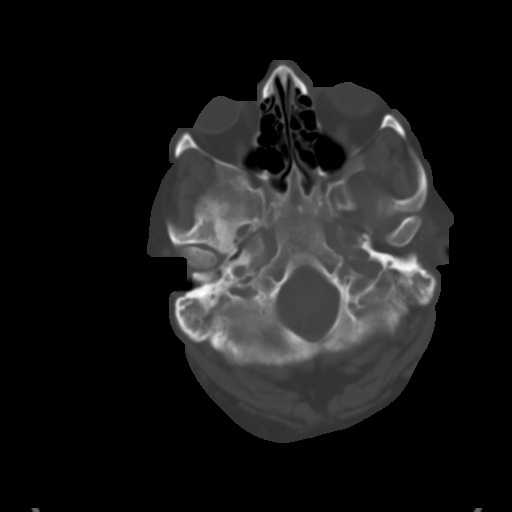
[im 6/31  brain]
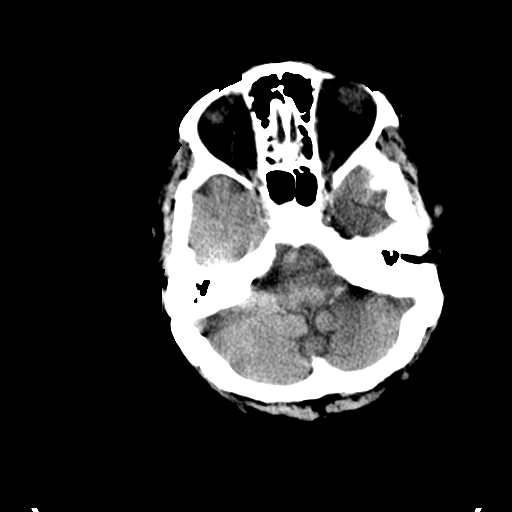
[im 9/31  brain]
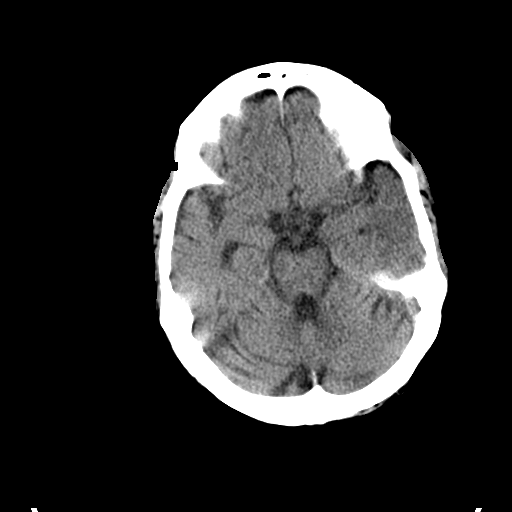
[im 12/31  brain]
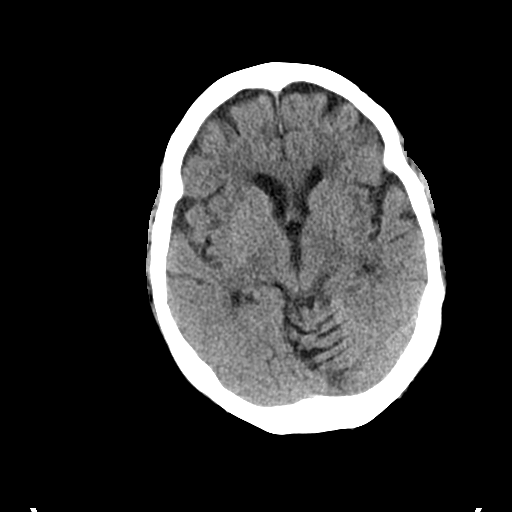
[im 16/31  brain]
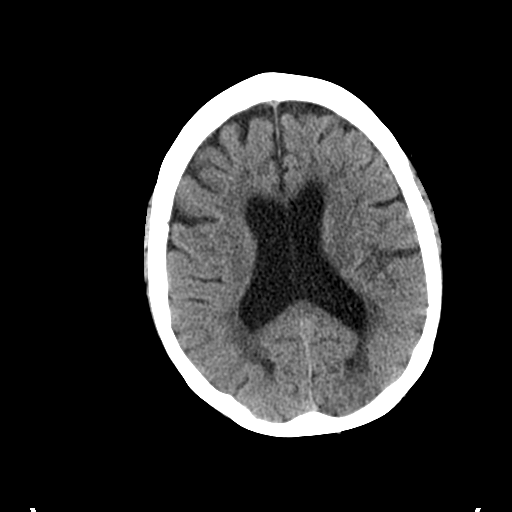
[im 16/31  bone]
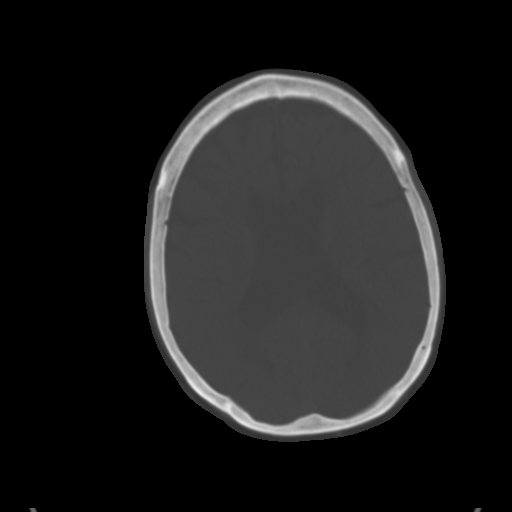
[im 19/31  brain]
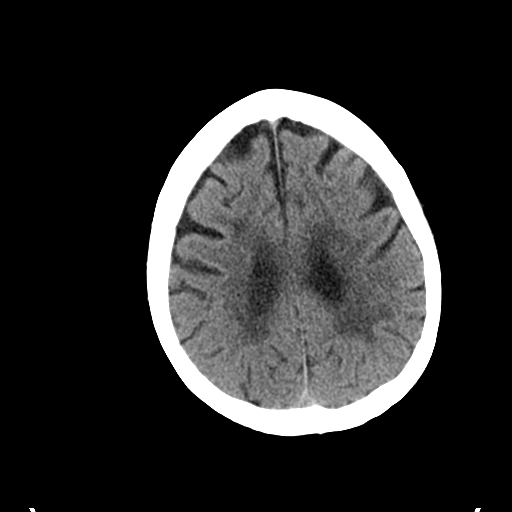
[im 22/31  brain]
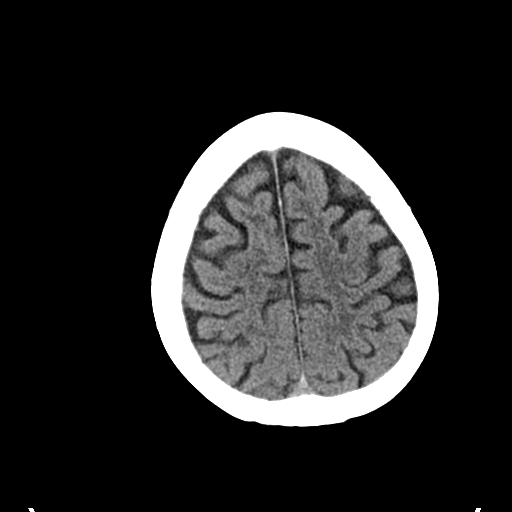
[im 25/31  brain]
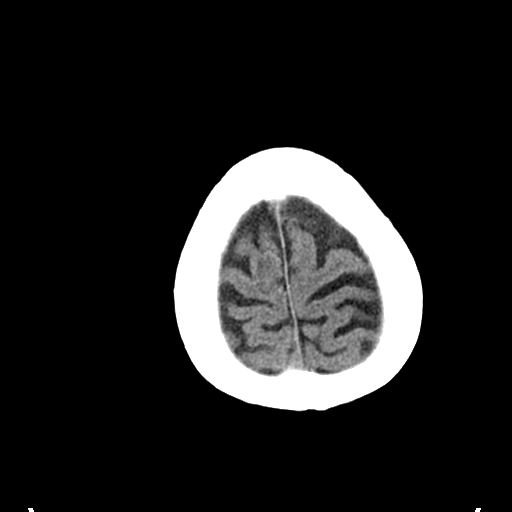
[im 28/31  brain]
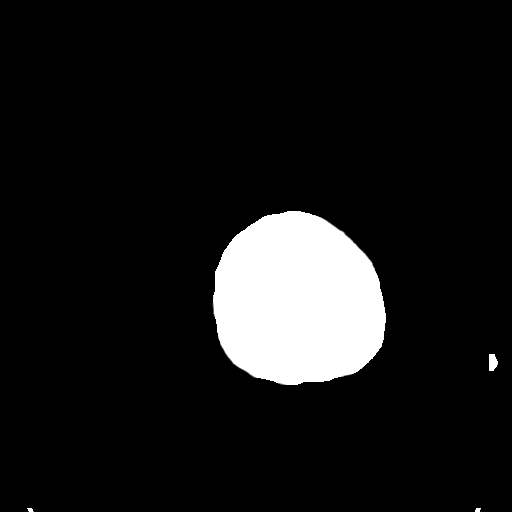
[im 28/31  bone]
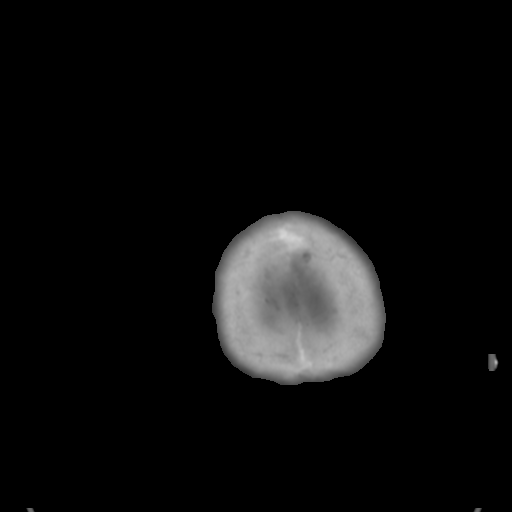

[Series 4: coronal soft tissue · coronal · 0.30mm/px · 3 of 75 slices shown]
[im 25/75  brain]
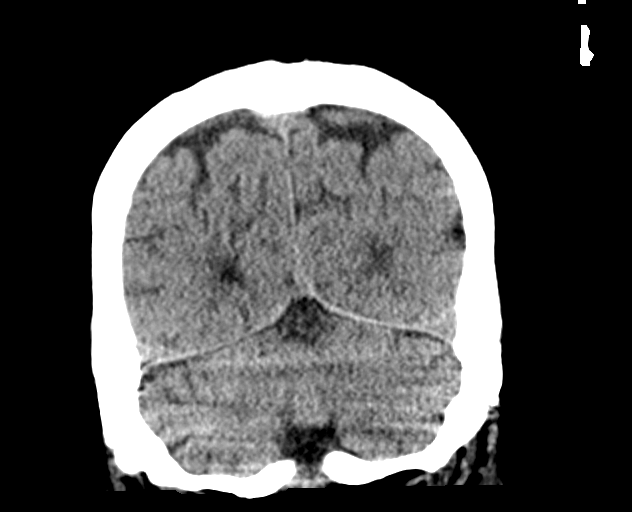
[im 33/75  brain]
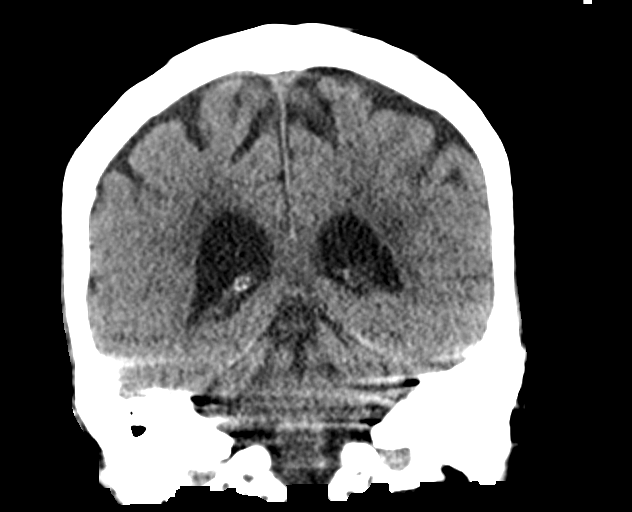
[im 42/75  brain]
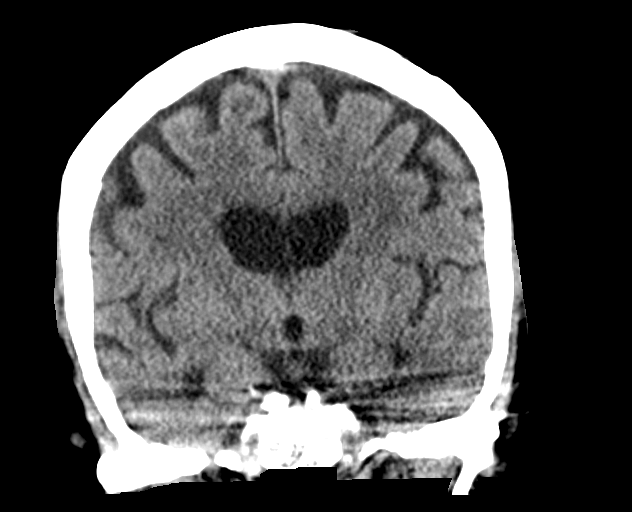

[Series 5: sagittal soft tissue · sagittal · 0.30mm/px · 3 of 60 slices shown]
[im 20/60  brain]
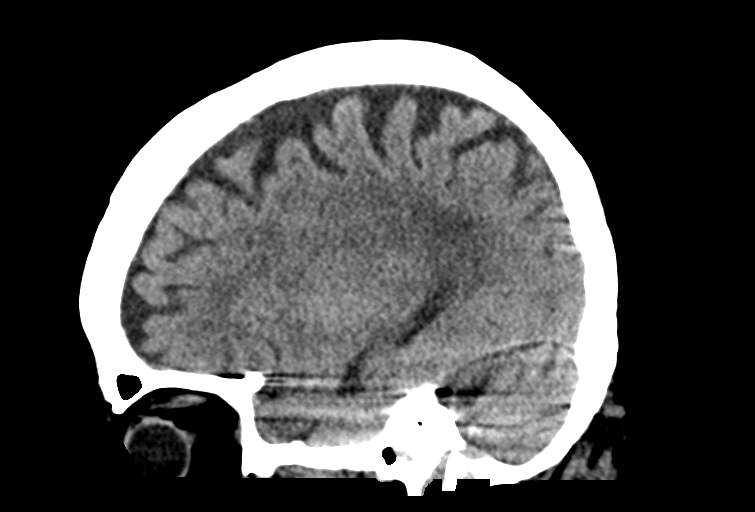
[im 30/60  brain]
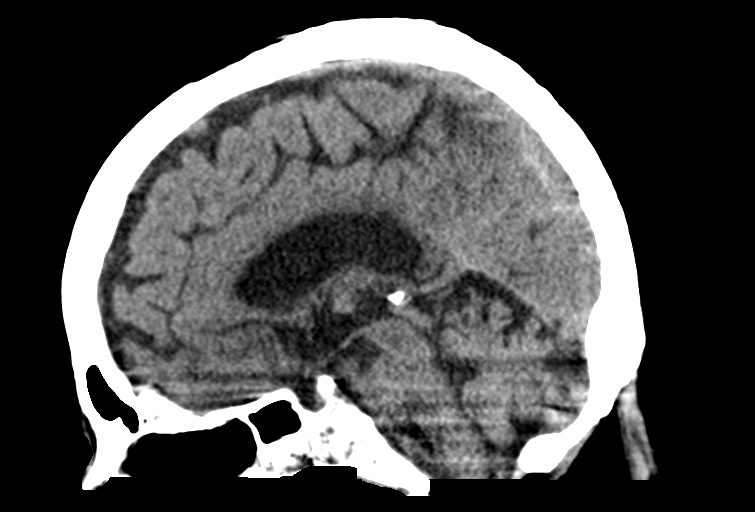
[im 40/60  brain]
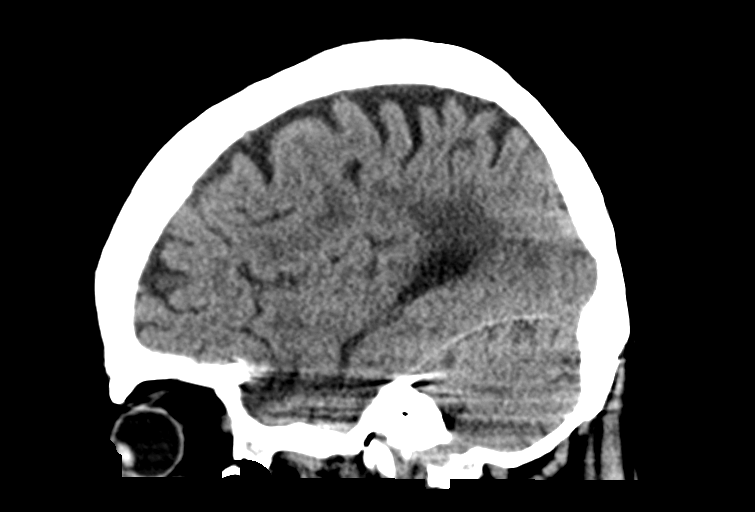

[15 of 47 positions shown; findings below may reference images not displayed]

FINDINGS: Brain: There is atrophy and chronic small vessel disease changes. No
acute intracranial abnormality. Specifically, no hemorrhage,
hydrocephalus, mass lesion, acute infarction, or significant
intracranial injury.

Vascular: No hyperdense vessel or unexpected calcification.

Skull: No acute calvarial abnormality.

Sinuses/Orbits: Visualized paranasal sinuses and mastoids clear.
Orbital soft tissues unremarkable.

Other: None
IMPRESSION: Atrophy, chronic microvascular disease.

No acute intracranial abnormality.

## 2021-03-23 IMAGING — CR DG HIP (WITH OR WITHOUT PELVIS) 2-3V LEFT
3 series · 3 of 3 positions shown · non-contrast
Comparison: 04/09/2018

CLINICAL DATA: Fell 2 days ago.  Left hip pain.

EXAM:
DG HIP (WITH OR WITHOUT PELVIS) 2-3V LEFT

[pelvis ap]
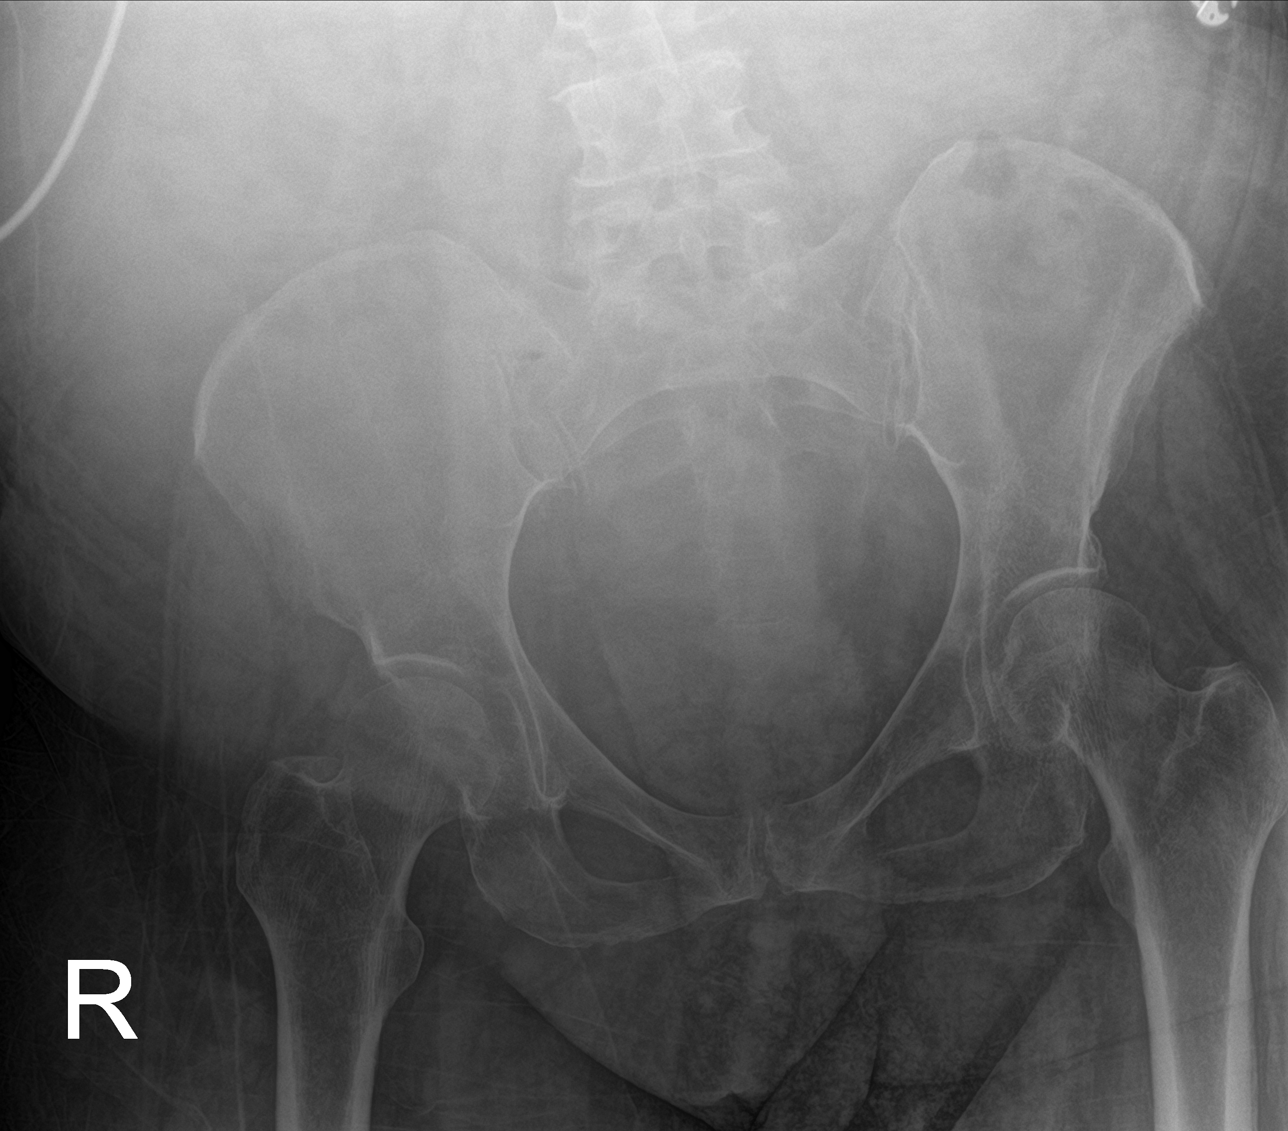

[hip ap]
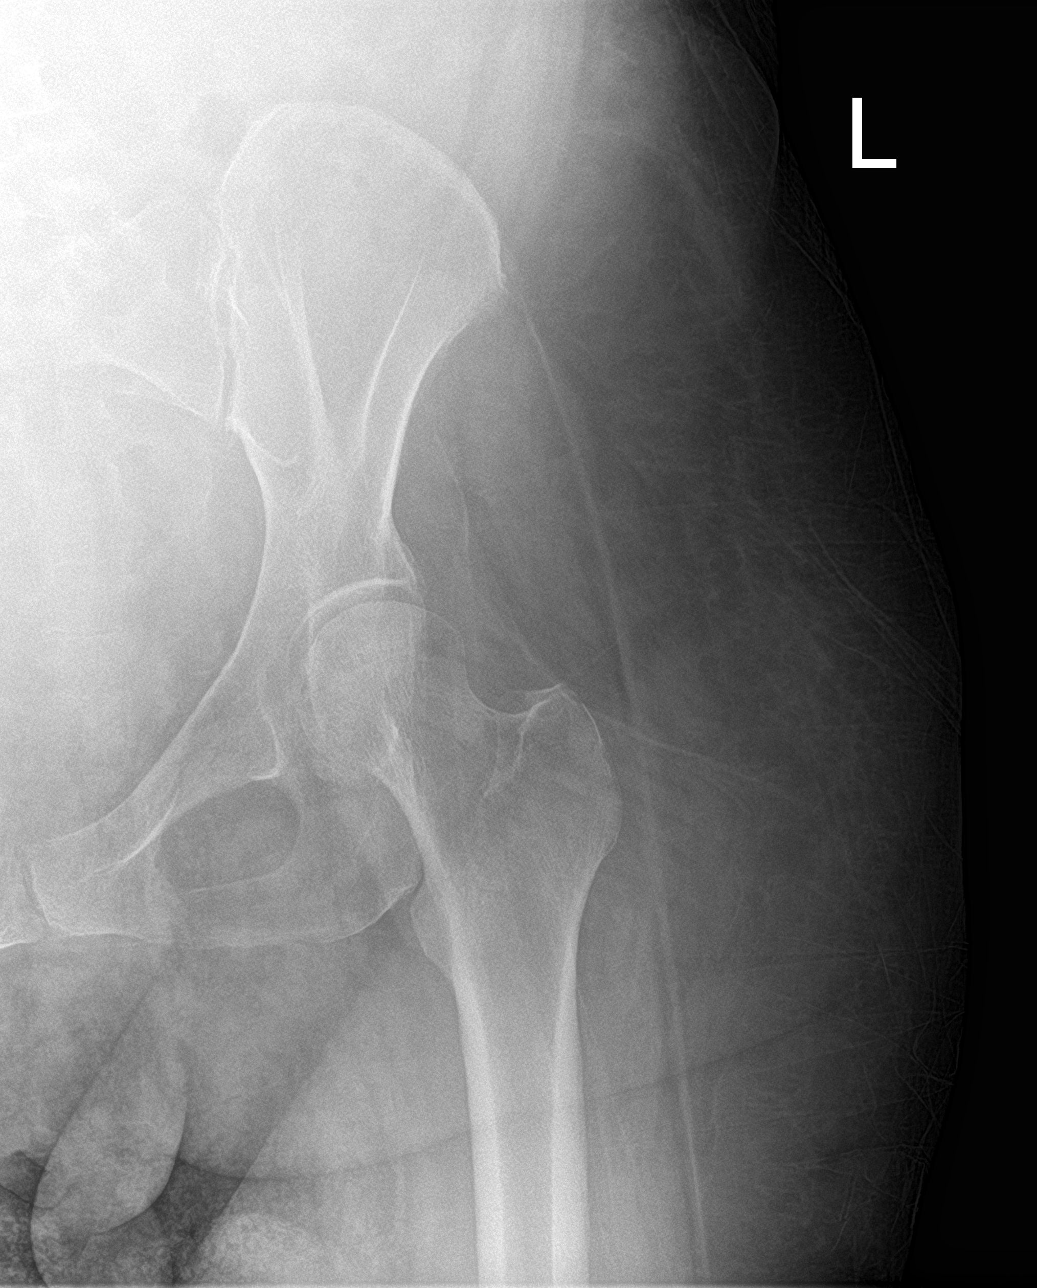

[hip lat]
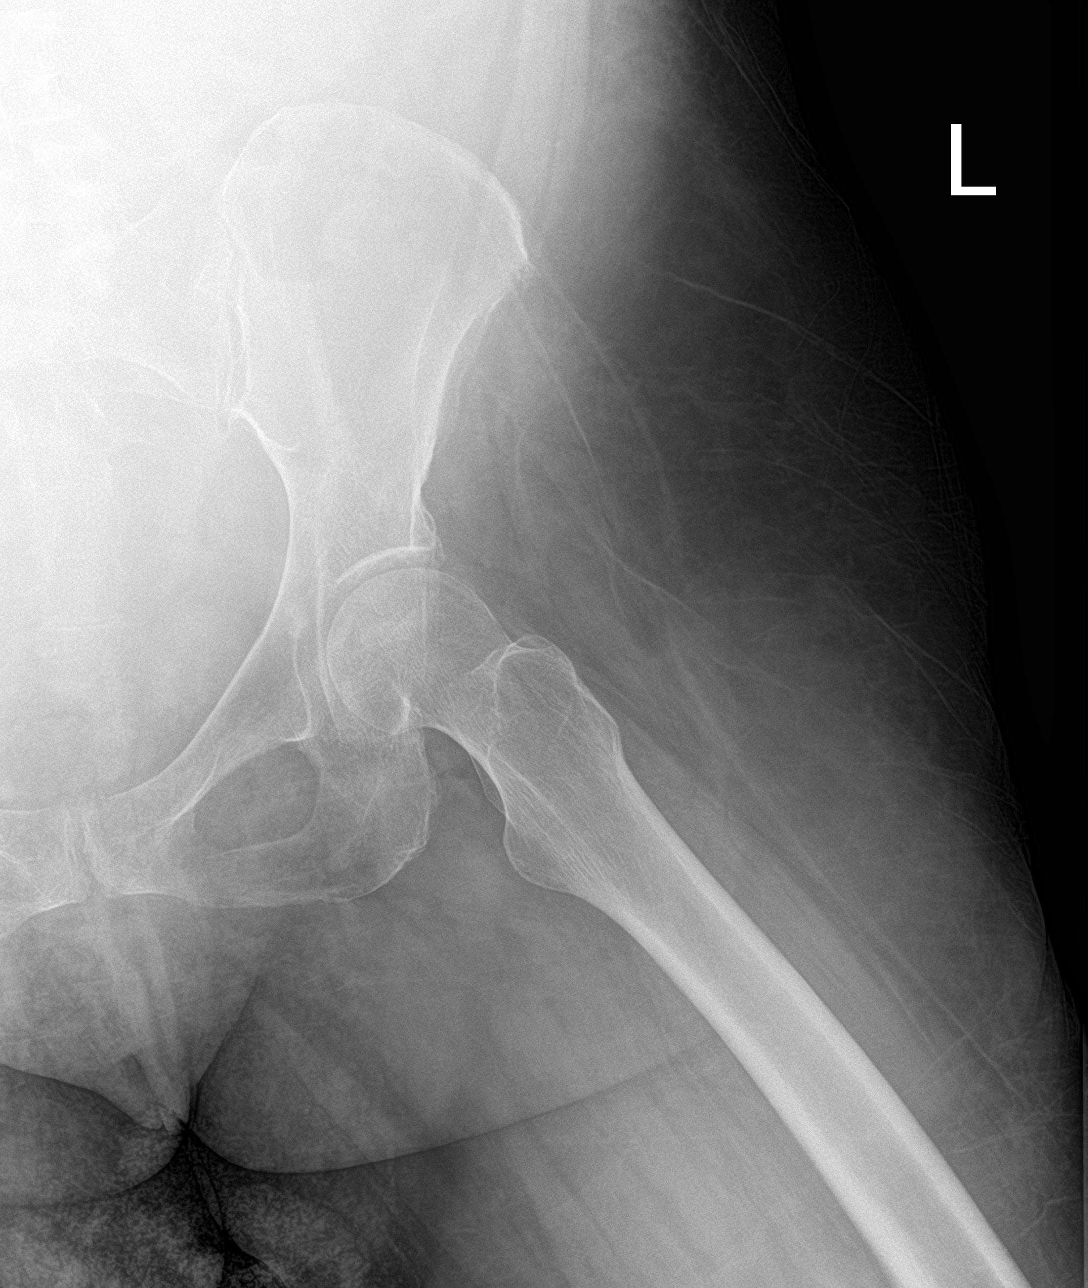

[3 of 3 positions shown; findings below may reference images not displayed]

FINDINGS: There is no evidence of hip fracture or dislocation. There is no
evidence of arthropathy or other focal bone abnormality.
IMPRESSION: Negative.

## 2021-03-23 IMAGING — CR LEFT KNEE - COMPLETE 4+ VIEW
5 series · 5 of 5 positions shown · non-contrast
Comparison: 04/14/2018

CLINICAL DATA: Fell 2 days ago.  Left knee pain.

EXAM:
LEFT KNEE - COMPLETE 4+ VIEW

[knee ap]
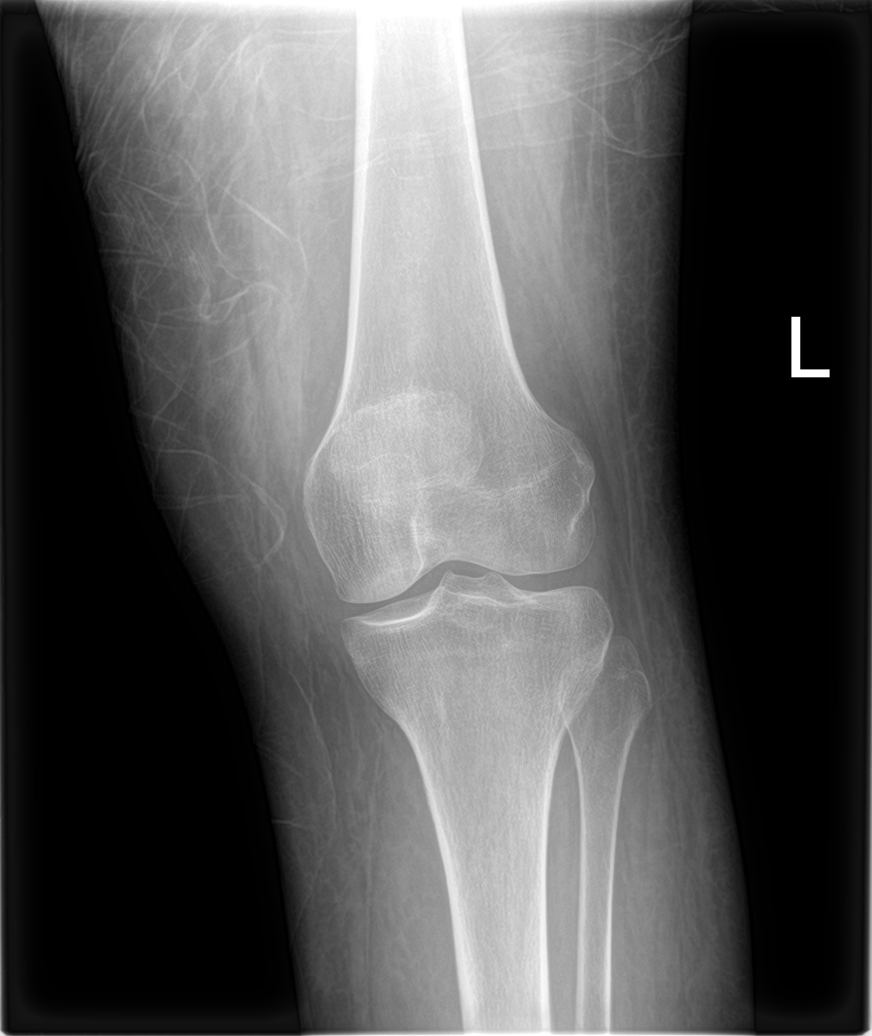

[knee lat (1 of 2)]
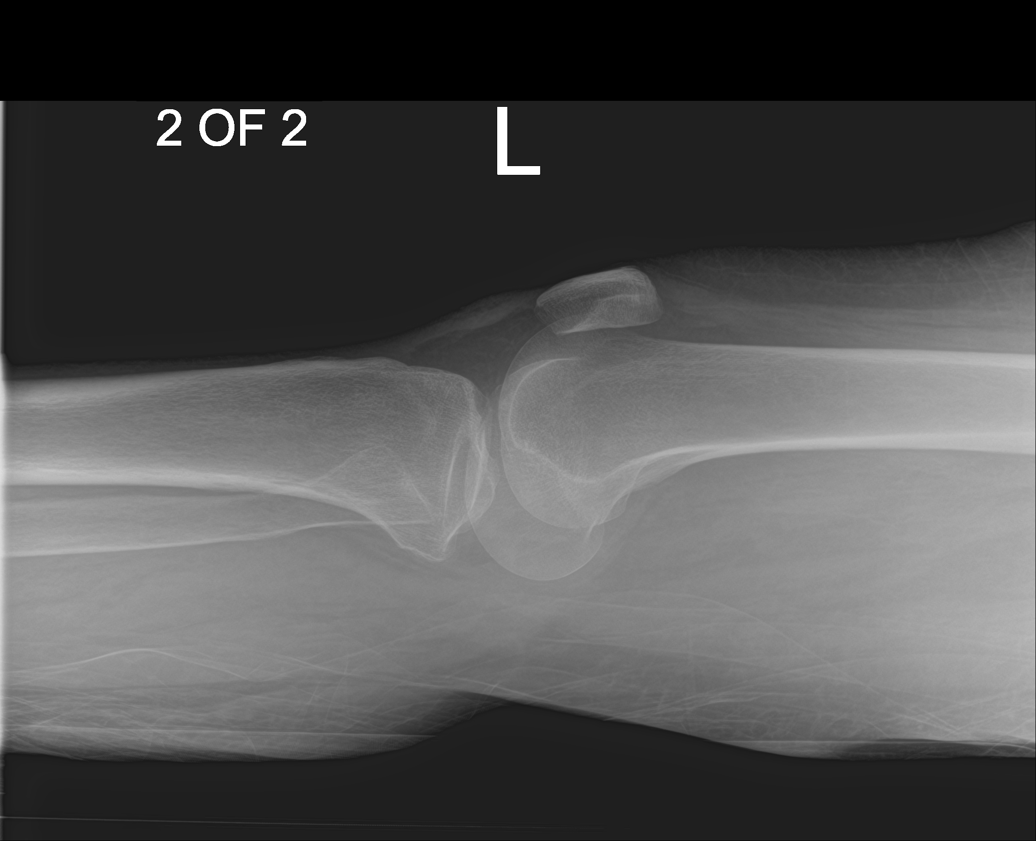

[knee obl (1 of 2)]
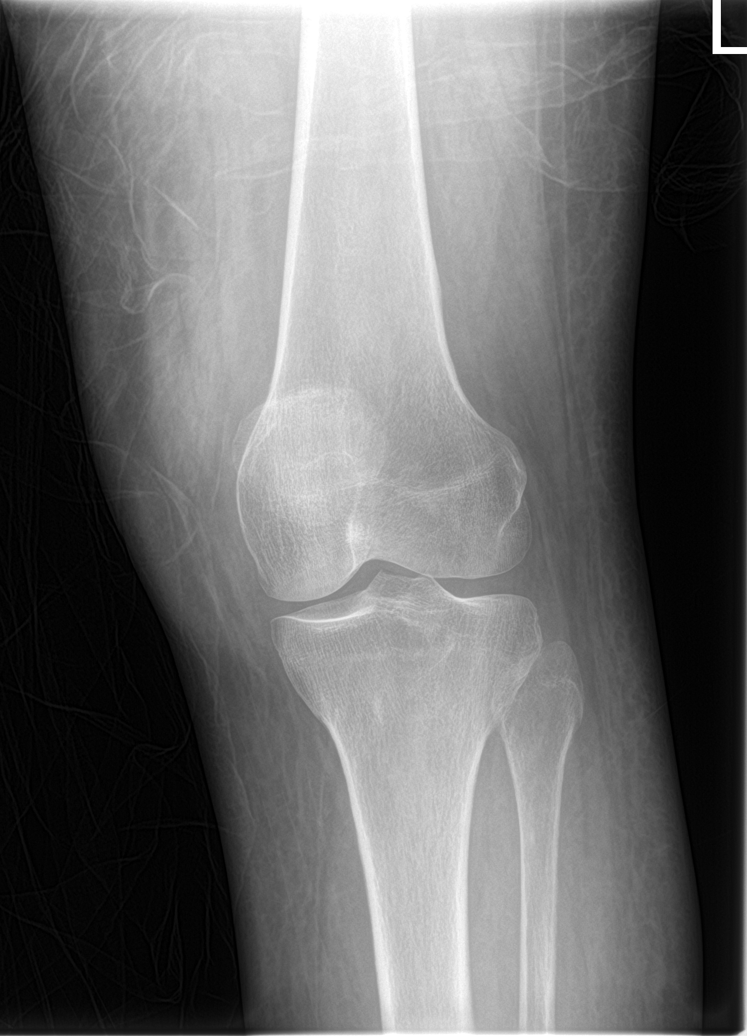

[knee obl (2 of 2)]
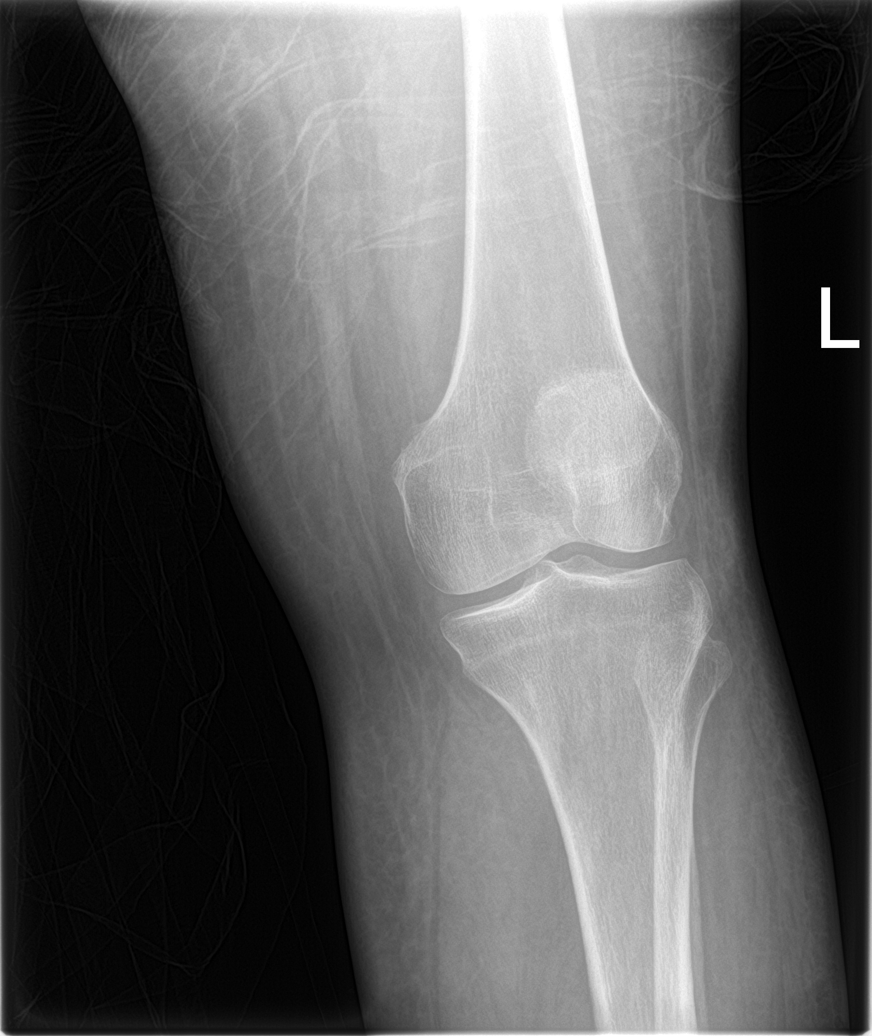

[knee lat (2 of 2)]
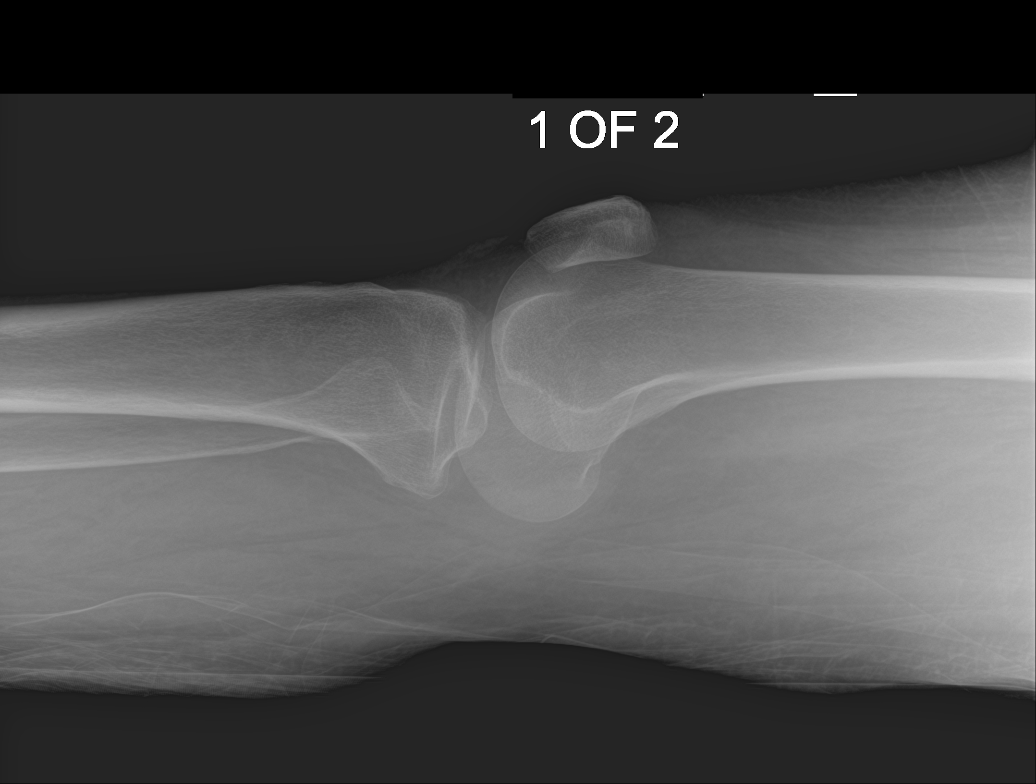

[5 of 5 positions shown; findings below may reference images not displayed]

FINDINGS: No evidence of fracture, dislocation, or joint effusion. No evidence
of arthropathy or other focal bone abnormality. Soft tissues are
unremarkable.
IMPRESSION: Negative.

## 2021-03-23 IMAGING — CT CT CERVICAL SPINE WITHOUT CONTRAST
5 of 8 series · 11 of 33 positions shown, 12 images · non-contrast
Comparison: None.

CLINICAL DATA: Head trauma status post fall

EXAM:
CT HEAD WITHOUT CONTRAST
CT CERVICAL SPINE WITHOUT CONTRAST
TECHNIQUE: Multidetector CT imaging of the head and cervical spine was
performed following the standard protocol without intravenous
contrast. Multiplanar CT image reconstructions of the cervical spine
were also generated.

[Series 4: head bone · axial · 0.44mm/px · z∈[-69,-15]mm · 2 of 82 slices shown]
[im 28/82  bone]
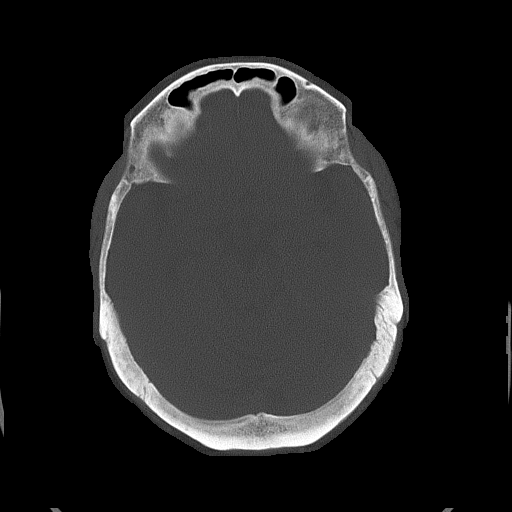
[im 55/82  bone]
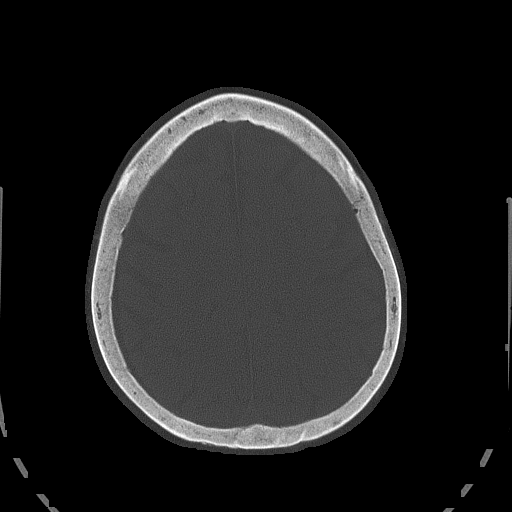

[Series 8: c_spine 2.0 st · axial · 0.32mm/px · z∈[-195,-141]mm · 2 of 83 slices shown, 3 images]
[im 28/83  soft-tissue]
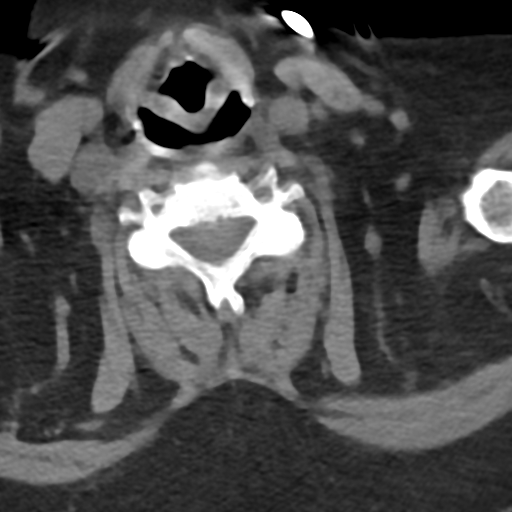
[im 28/83  bone]
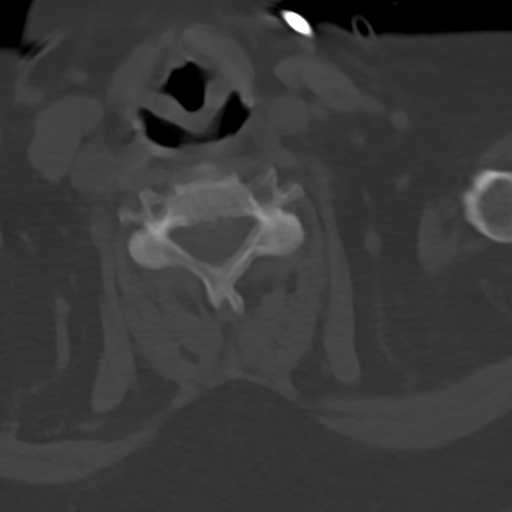
[im 55/83  bone]
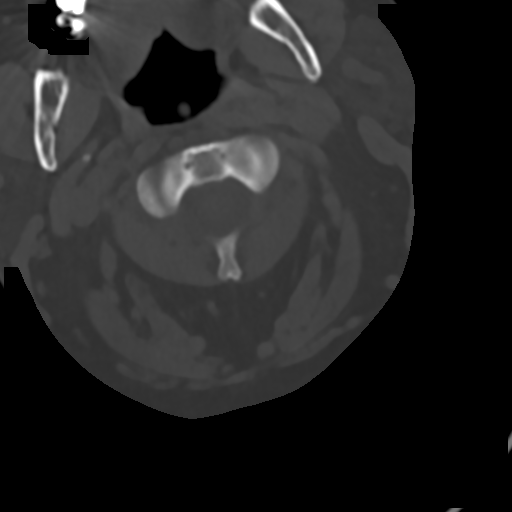

[Series 10: c_spine 2.0 sag bone · sagittal · 0.24mm/px · 4 of 61 slices shown]
[im 13/61  bone]
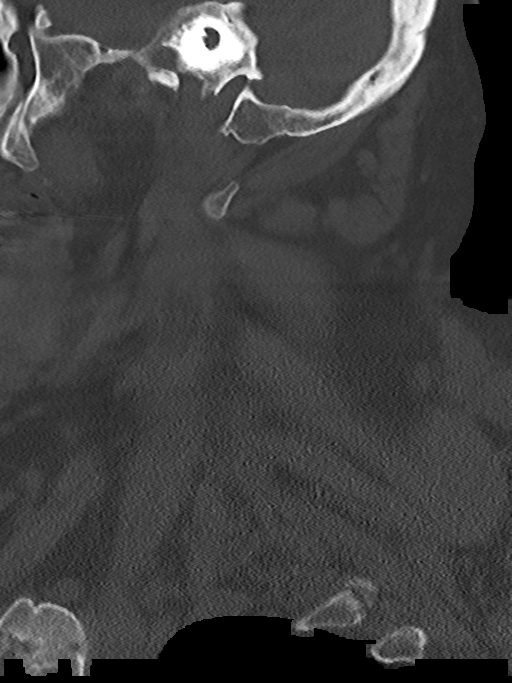
[im 25/61  bone]
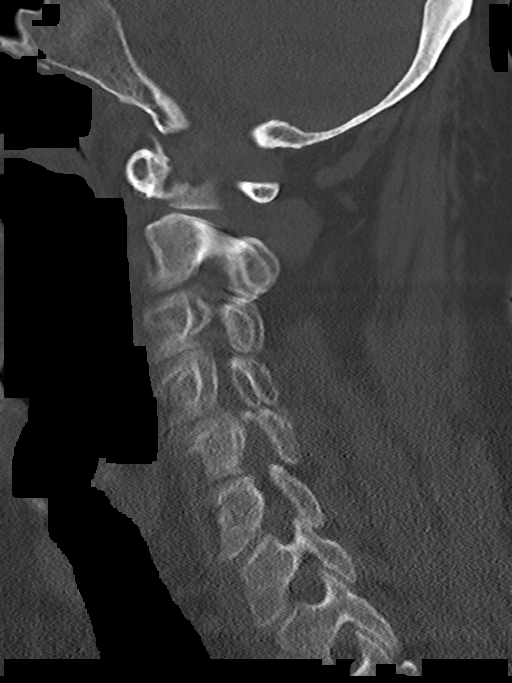
[im 37/61  bone]
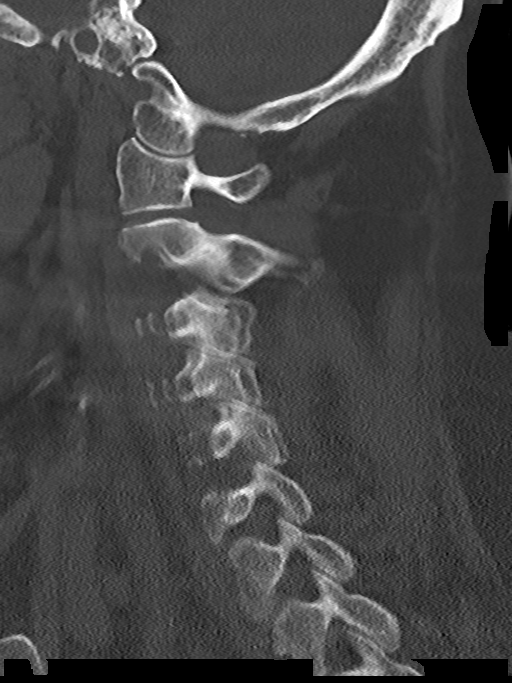
[im 49/61  bone]
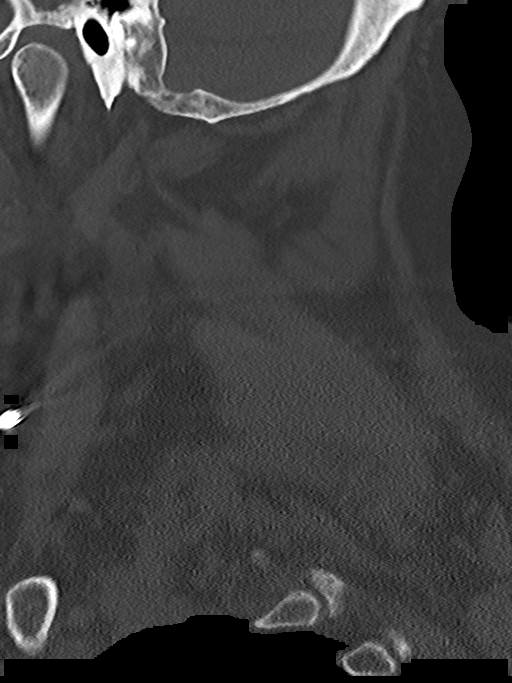

[Series 11: c_spine 2.0 cor bone · coronal · 0.24mm/px · 1 of 61 slices shown]
[im 31/61  bone]
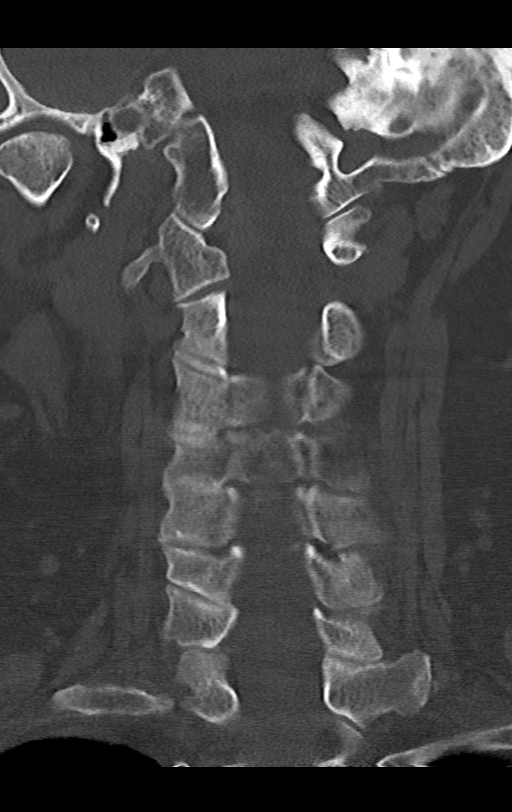

[Series 12: c_spine 2.0 orthogonals · axial · 0.21mm/px · z∈[-222,-173]mm · 2 of 77 slices shown]
[im 26/77  bone]
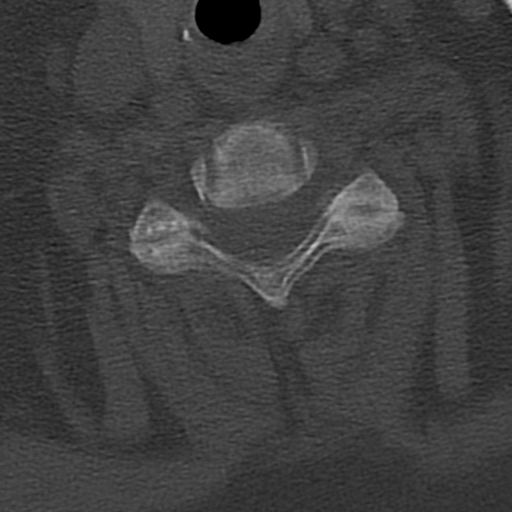
[im 51/77  bone]
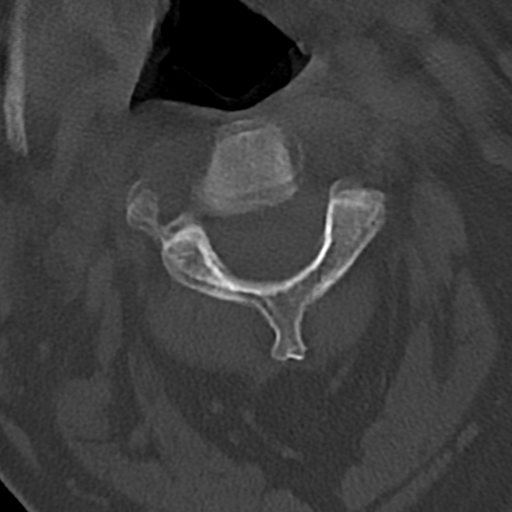

[11 of 33 positions shown; findings below may reference images not displayed]

FINDINGS: CT HEAD FINDINGS

Brain: No evidence of acute infarction, hemorrhage, extra-axial
collection, ventriculomegaly, or mass effect. Generalized cerebral
atrophy. Periventricular white matter low attenuation likely
secondary to microangiopathy.

Vascular: Cerebrovascular atherosclerotic calcifications are noted.

Skull: Negative for fracture or focal lesion.

Sinuses/Orbits: Visualized portions of the orbits are unremarkable.
Visualized portions of the paranasal sinuses and mastoid air cells
are unremarkable.

Other: None.

CT CERVICAL SPINE FINDINGS

Significant patient motion degrades image quality limiting
evaluation.

Alignment: Normal.

Skull base and vertebrae: No acute fracture. No primary bone lesion
or focal pathologic process.

Soft tissues and spinal canal: No prevertebral fluid or swelling. No
visible canal hematoma.

Disc levels:  Disc spaces are preserved.

Upper chest: Lung apices are clear.

Other: No fluid collection or hematoma.
IMPRESSION: 1. No acute intracranial pathology.
2. No acute osseous injury of the cervical spine. Evaluation of the
cervical spine is limited secondary to patient motion throughout the
examination.

## 2021-04-08 IMAGING — DX PORTABLE PELVIS 1-2 VIEWS
1 series · 1 of 1 positions shown · non-contrast
Comparison: CT Abdomen and Pelvis 04/27/2018.

CLINICAL DATA: 57-year-old female status post fall from bed.
Combative.

EXAM:
PORTABLE PELVIS 1-2 VIEWS

[pelvis ap]
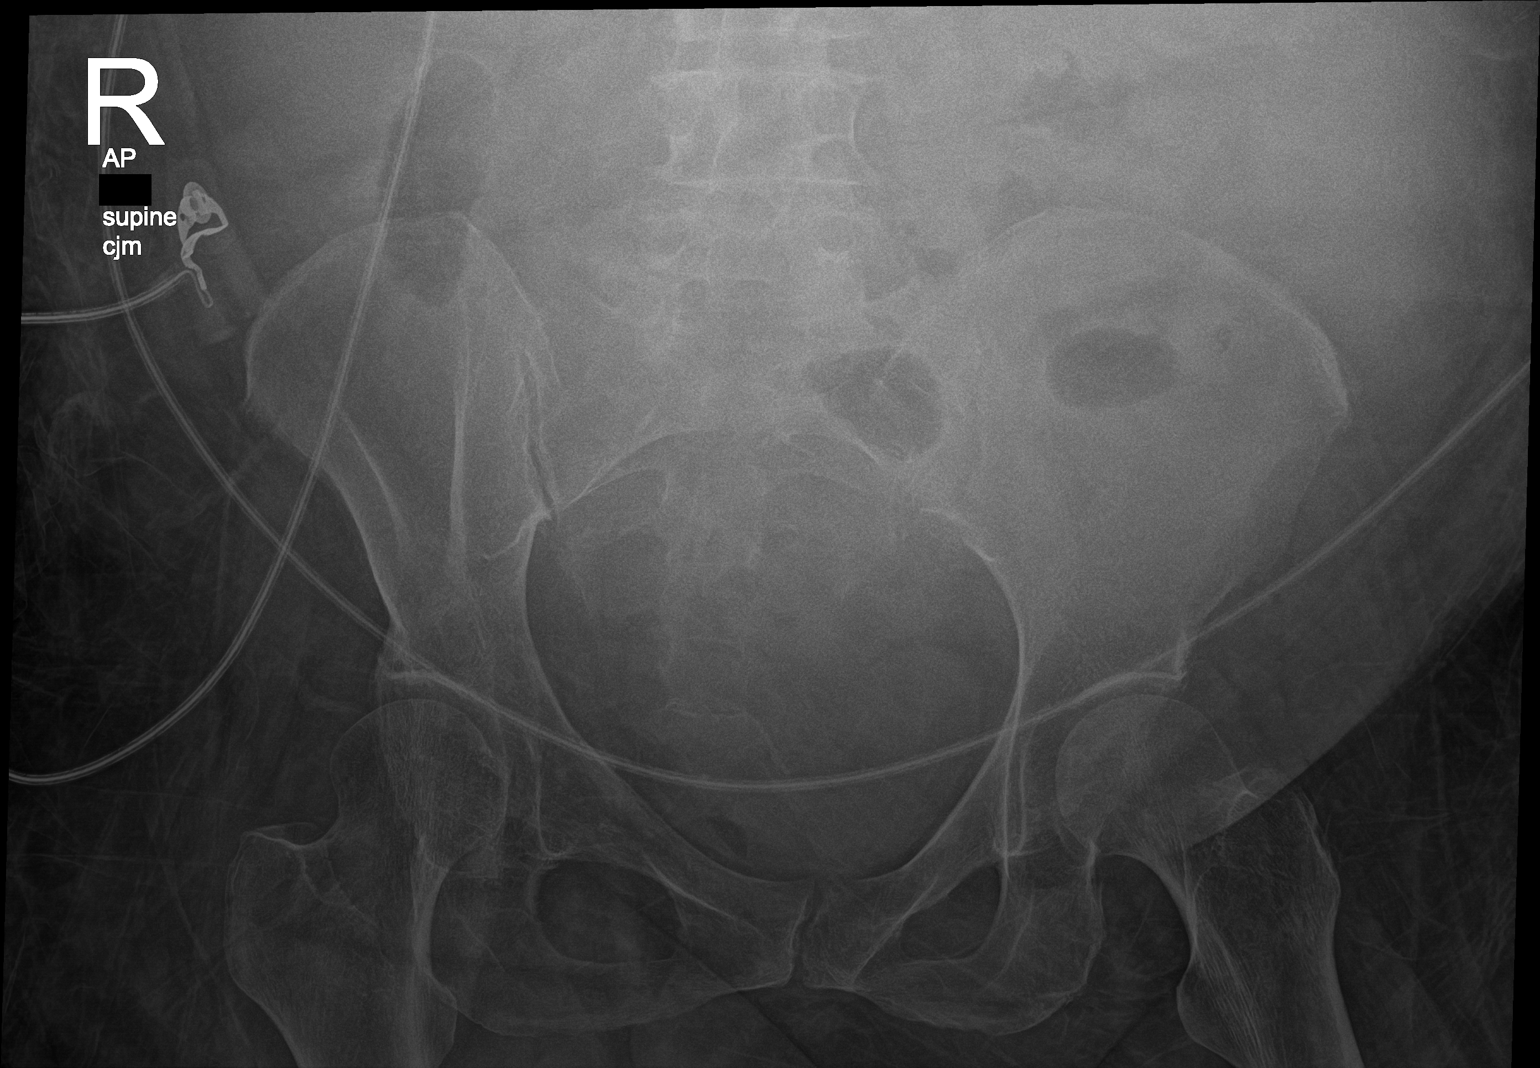

[1 of 1 positions shown; findings below may reference images not displayed]

FINDINGS: Portable AP supine view at 1559 hours. Mildly rotated to the left.
Femoral heads are normally located. Hip joint spaces appear normal.
Grossly intact visible proximal femurs. No pelvis fracture
identified. Negative visible bowel gas pattern.
IMPRESSION: No acute fracture or dislocation identified about the pelvis.

## 2021-04-23 IMAGING — US IR PARACENTESIS
1 series · 2 of 2 positions shown · non-contrast
Comparison: none

INDICATION: Patient with history of alcoholic cirrhosis, recurrent ascites.
Request made for diagnostic and therapeutic paracentesis.

[Series 1: ir (id) (id)/(id)/(id) ir · 2 of 2 slices shown]
[im 1/2]
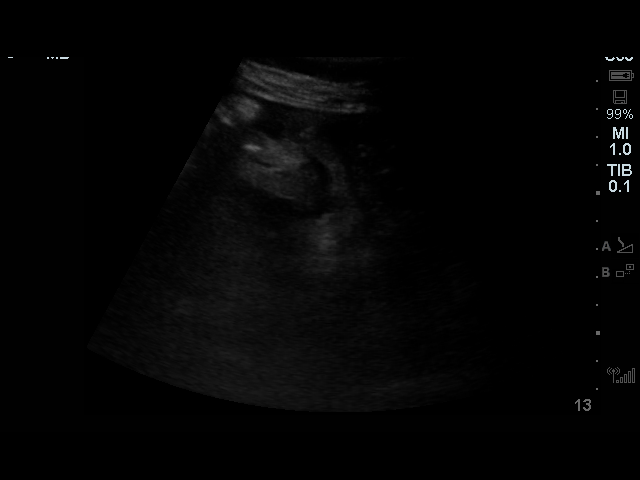
[im 2/2]
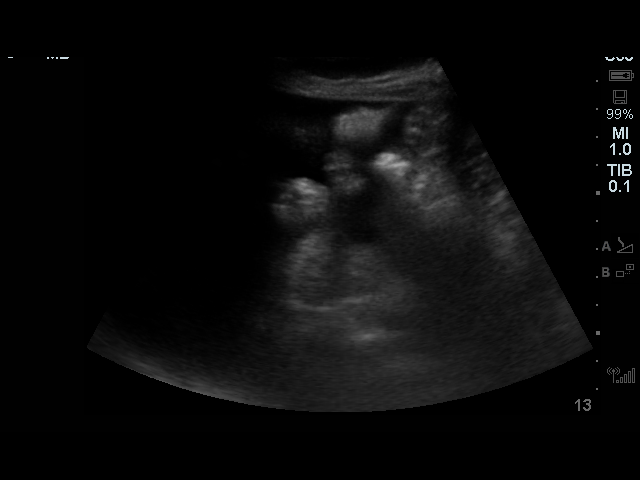

[2 of 2 positions shown; findings below may reference images not displayed]

EXAM:
ULTRASOUND GUIDED DIAGNOSTIC AND THERAPEUTIC PARACENTESIS

MEDICATIONS:
None

COMPLICATIONS:
None immediate.

PROCEDURE:
Informed written consent was obtained from the patient after a
discussion of the risks, benefits and alternatives to treatment. A
timeout was performed prior to the initiation of the procedure.

Initial ultrasound scanning demonstrates a large amount of ascites
within the right lower abdominal quadrant. The right lower abdomen
was prepped and draped in the usual sterile fashion. 1% lidocaine
was used for local anesthesia.

Following this, a 19 gauge, 10-cm, Yueh catheter was introduced. An
ultrasound image was saved for documentation purposes. The
paracentesis was performed. The catheter was removed and a dressing
was applied. The patient tolerated the procedure well without
immediate post procedural complication.
FINDINGS: A total of approximately 6.1 liters of yellow fluid was removed.
Samples were sent to the laboratory as requested by the clinical
team.
IMPRESSION: Successful ultrasound-guided diagnostic and therapeutic paracentesis
yielding 6.1 liters of peritoneal fluid.

## 2021-05-09 IMAGING — DX CHEST  1 VIEW
1 series · 1 of 1 positions shown · non-contrast
Comparison: 05/30/2018

CLINICAL DATA: Shortness of breath

EXAM:
CHEST  1 VIEW

[chest ap]
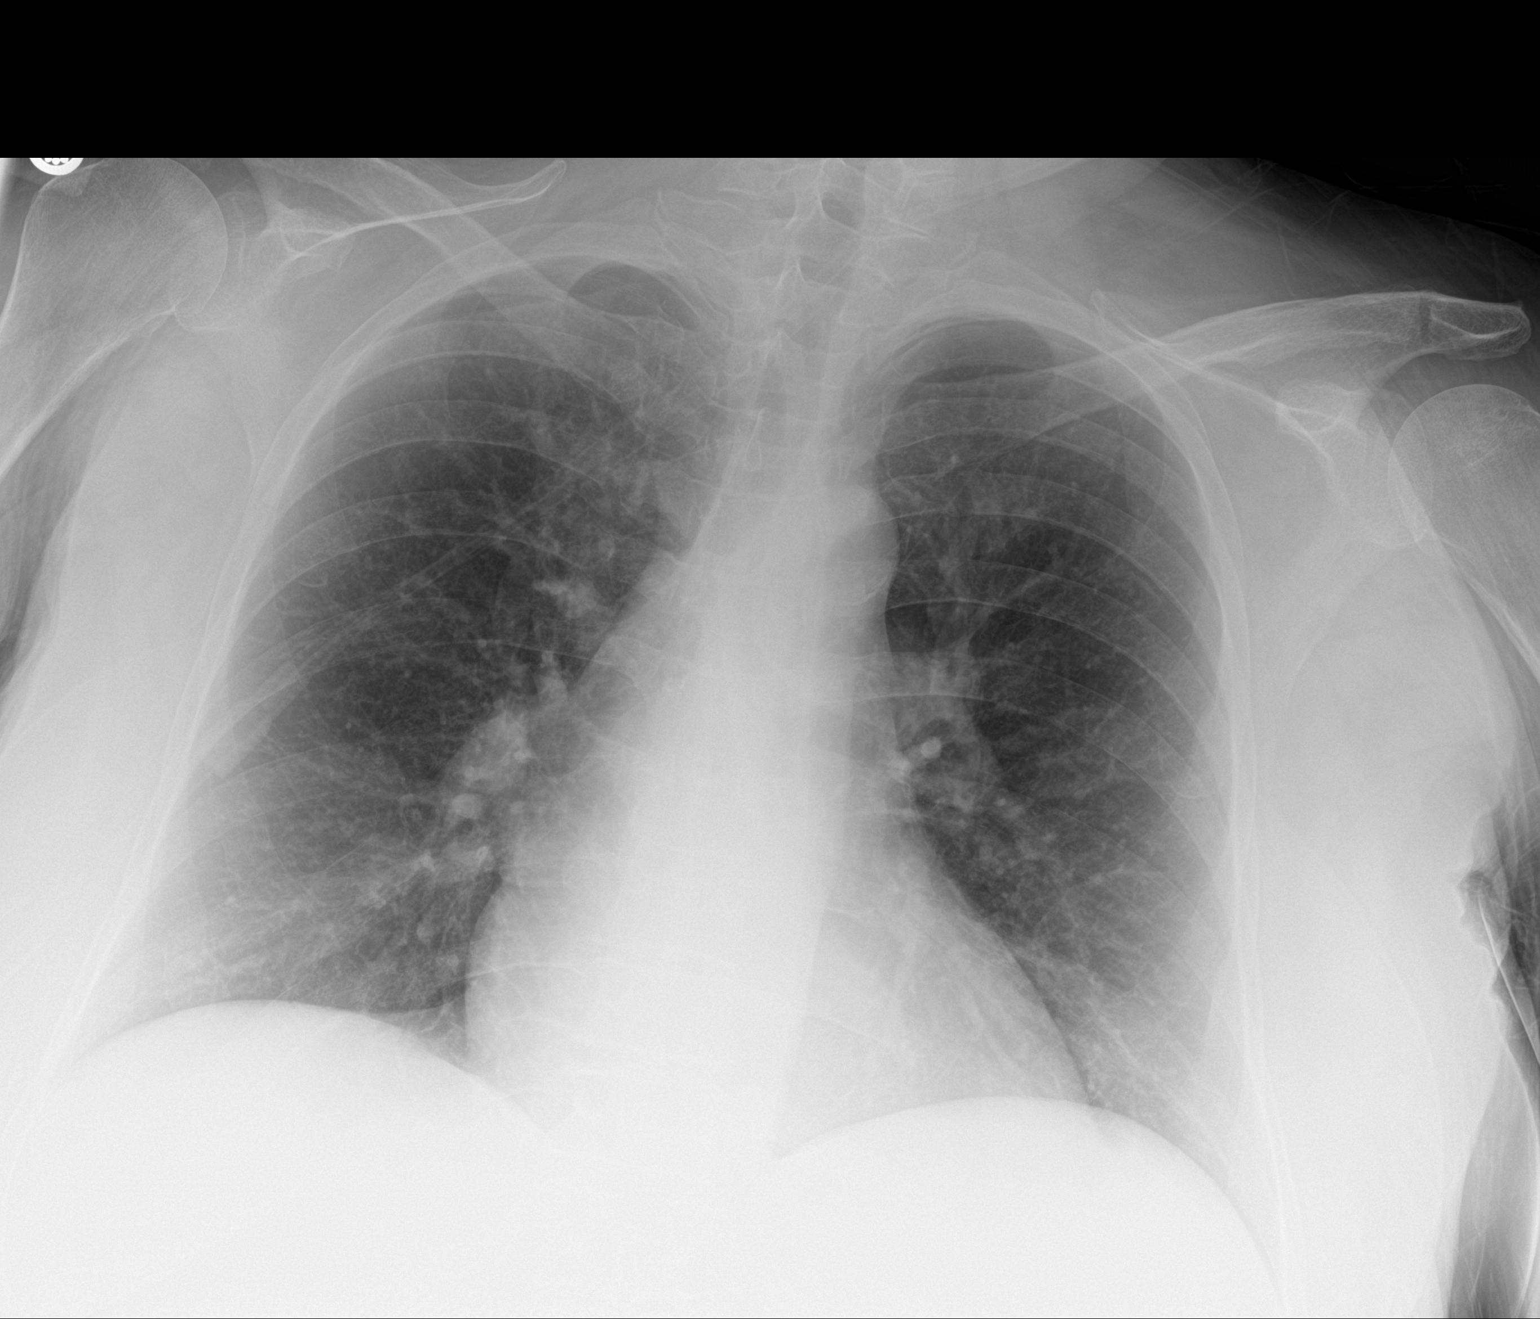

[1 of 1 positions shown; findings below may reference images not displayed]

FINDINGS: The heart size and mediastinal contours are within normal limits.
Both lungs are clear. The visualized skeletal structures are
unremarkable.
IMPRESSION: No active disease.

## 2021-05-11 IMAGING — US PARACENTESIS WITH ULTRASOUND GUIDANCE
1 series · 5 of 5 positions shown · non-contrast
Comparison: none

INDICATION: Patient with history of alcoholic cirrhosis with recurrent ascites.
Request is made for therapeutic paracentesis.

[Series 1: paracentesis with ultrasound guidance · 5 of 5 slices shown]
[im 1/5]
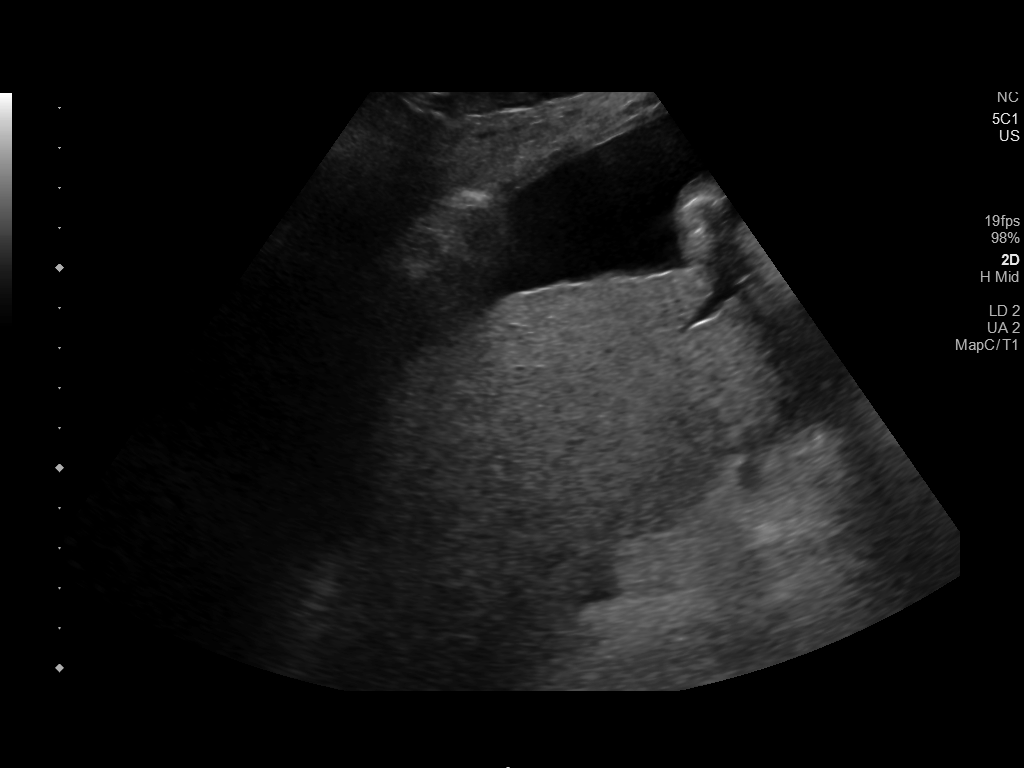
[im 2/5]
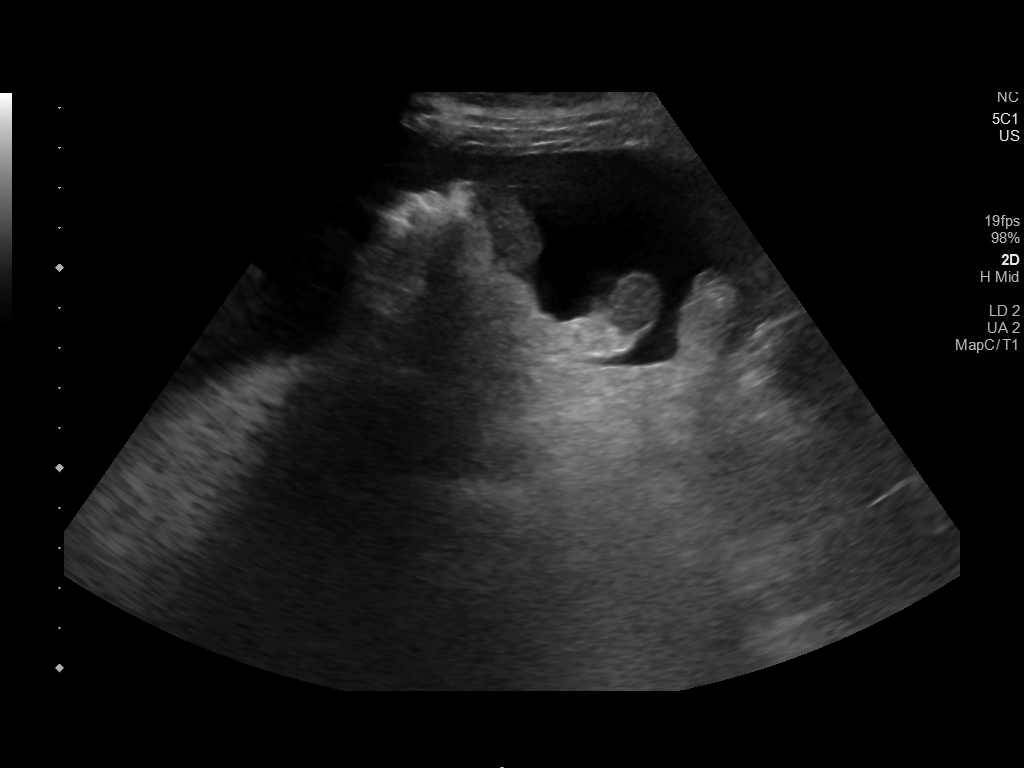
[im 3/5]
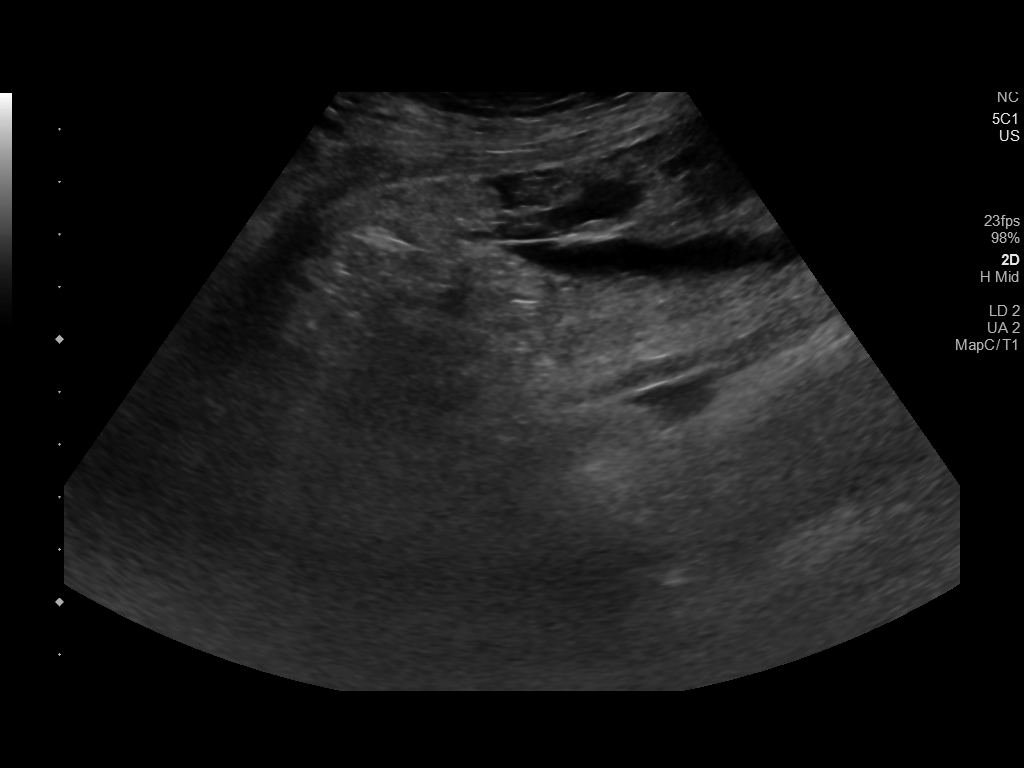
[im 4/5]
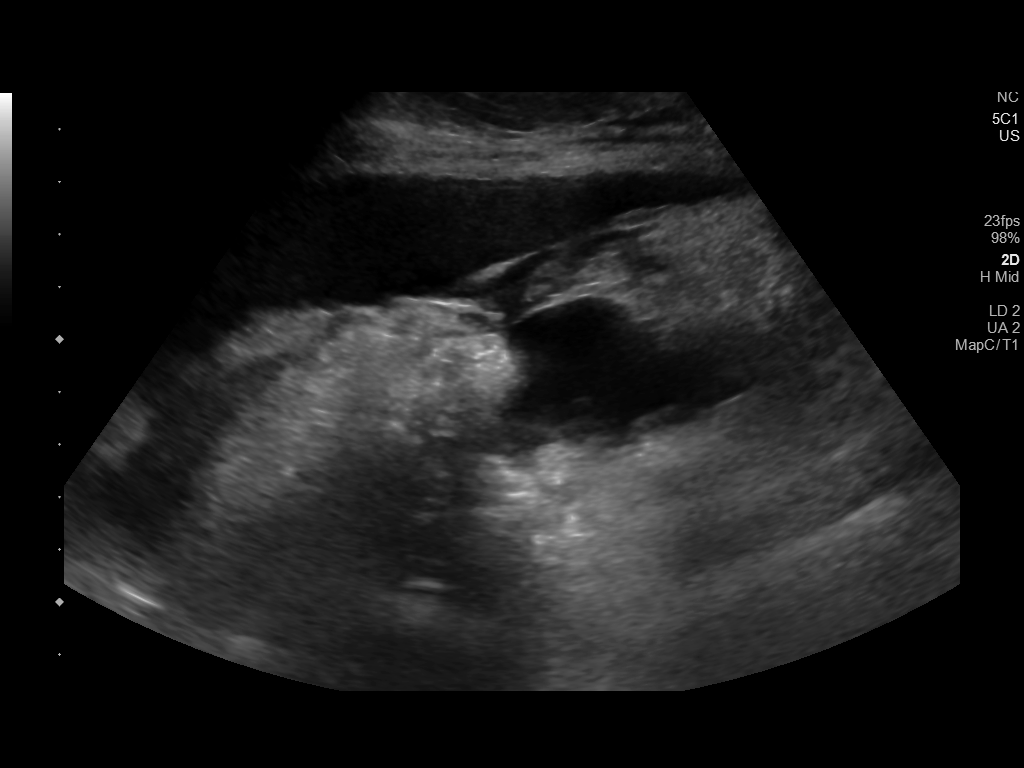
[im 5/5]
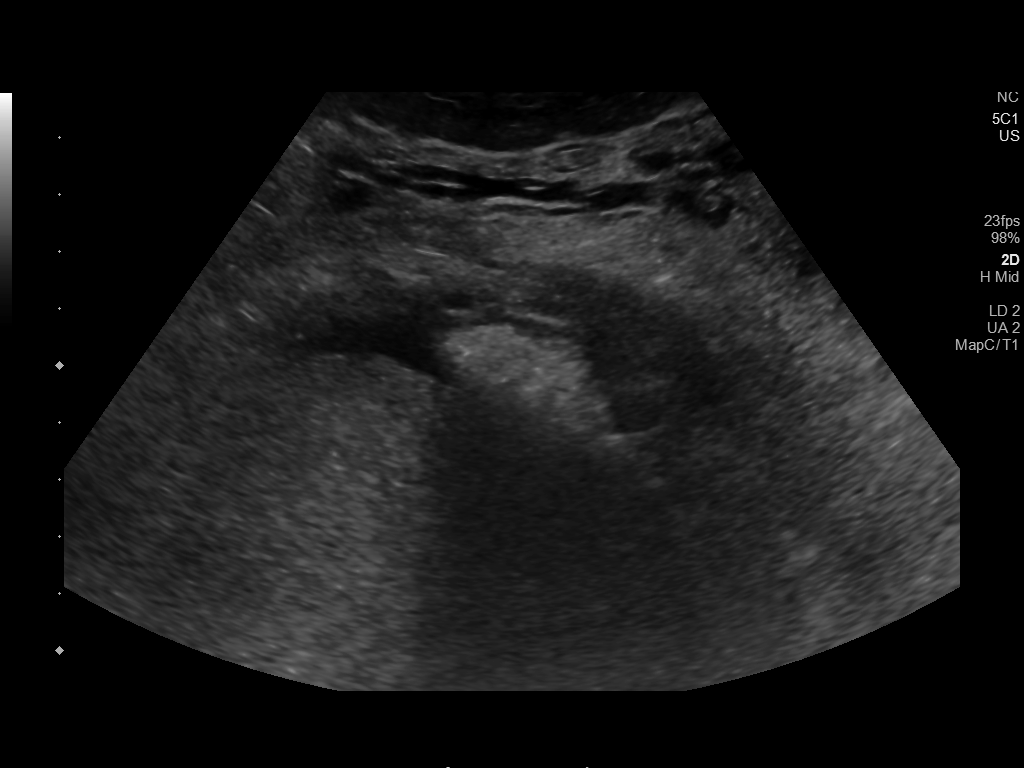

[5 of 5 positions shown; findings below may reference images not displayed]

EXAM:
ULTRASOUND GUIDED THERAPEUTIC PARACENTESIS

MEDICATIONS:
10 mL 1% lidocaine

COMPLICATIONS:
None immediate.

PROCEDURE:
Informed written consent was obtained from the patient after a
discussion of the risks, benefits and alternatives to treatment. A
timeout was performed prior to the initiation of the procedure.

Initial ultrasound scanning demonstrates a moderate amount of
ascites within the right lower abdominal quadrant. The right lower
abdomen was prepped and draped in the usual sterile fashion. 1%
lidocaine was used for local anesthesia.

Following this, a 19 gauge, 7-cm, Yueh catheter was introduced. An
ultrasound image was saved for documentation purposes. The
paracentesis was performed. The catheter was removed and a dressing
was applied. The patient tolerated the procedure well without
immediate post procedural complication.
FINDINGS: A total of approximately 5.7 L of clear gold fluid was removed.
IMPRESSION: Successful ultrasound-guided paracentesis yielding 5.7 L of
peritoneal fluid.

## 2021-05-28 IMAGING — CT CT ABDOMEN AND PELVIS WITH CONTRAST
2 of 5 series · 15 of 46 positions shown, 17 images · IV contrast (APPLIED)
Comparison: April 27, 2018

CLINICAL DATA: Hepatic cirrhosis.  Abdominal distension.

EXAM:
CT ABDOMEN AND PELVIS WITH CONTRAST
TECHNIQUE: Multidetector CT imaging of the abdomen and pelvis was performed
using the standard protocol following bolus administration of
intravenous contrast.
CONTRAST:  100mL OMNIPAQUE IOHEXOL 300 MG/ML  SOLN

[Series 3: abd/ pelvis 5.0 i30f 2 · axial · 0.93mm/px · z∈[+807,+1222]mm · 12 of 93 slices shown, 14 images]
[im 5/93  soft-tissue]
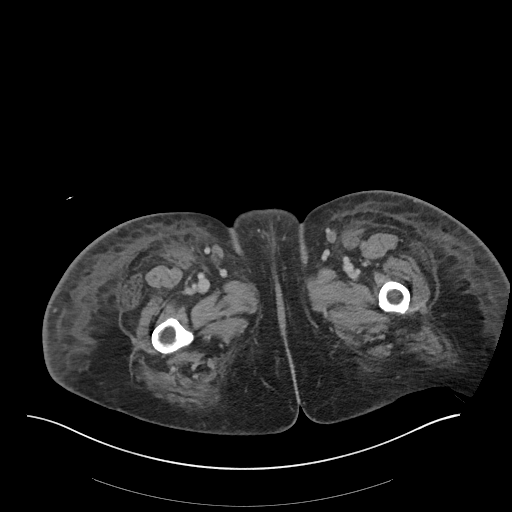
[im 5/93  bone]
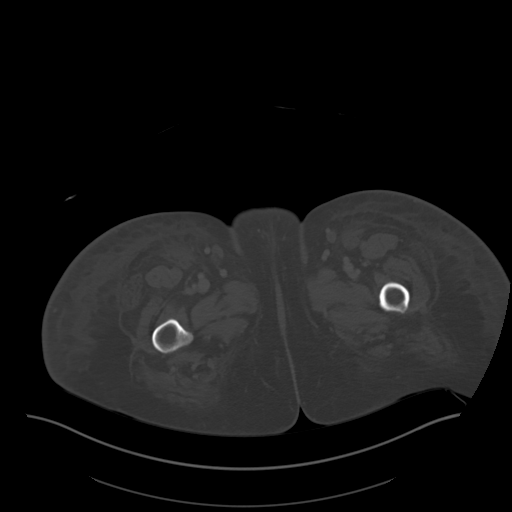
[im 14/93  soft-tissue]
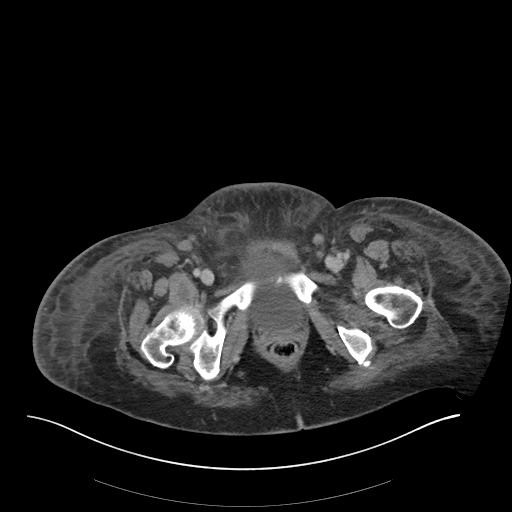
[im 19/93  soft-tissue]
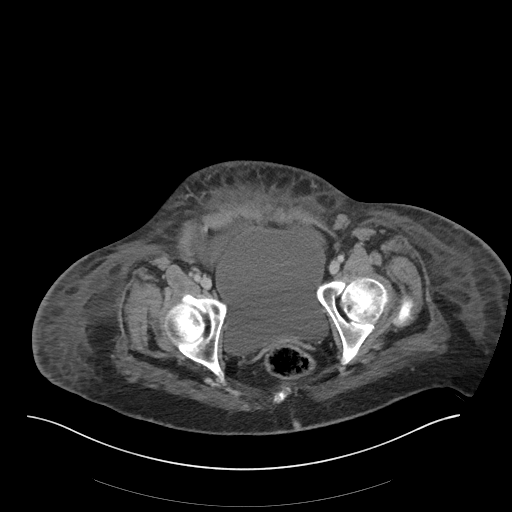
[im 28/93  soft-tissue]
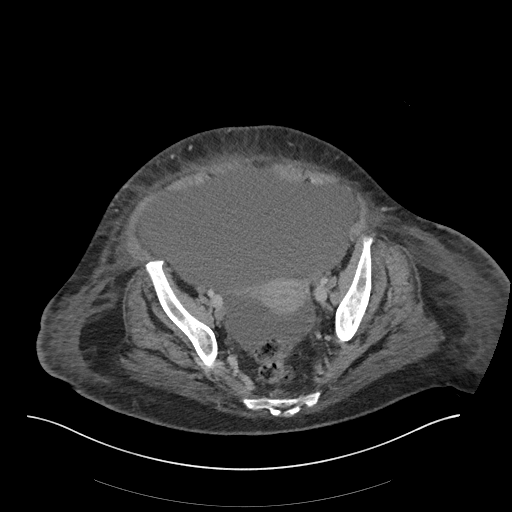
[im 37/93  soft-tissue]
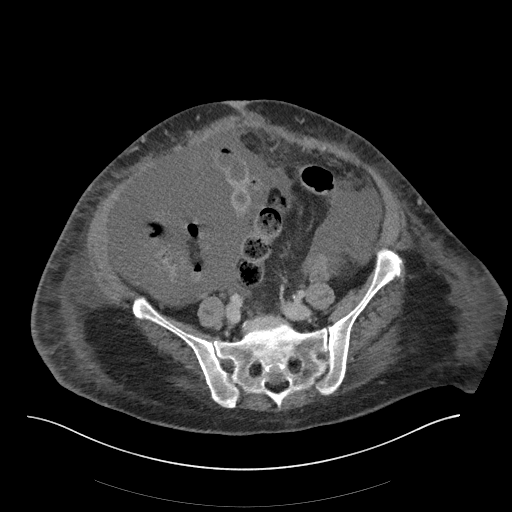
[im 42/93  soft-tissue]
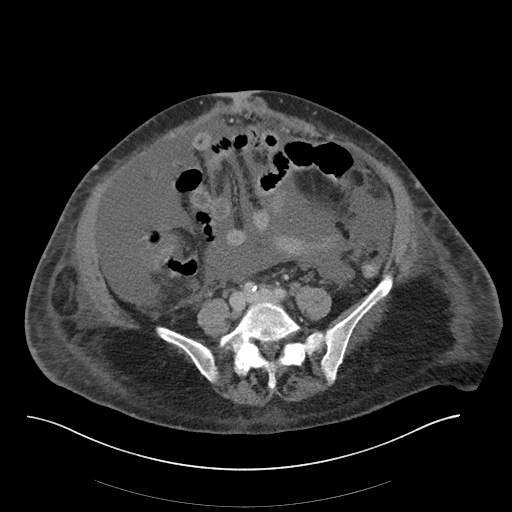
[im 51/93  soft-tissue]
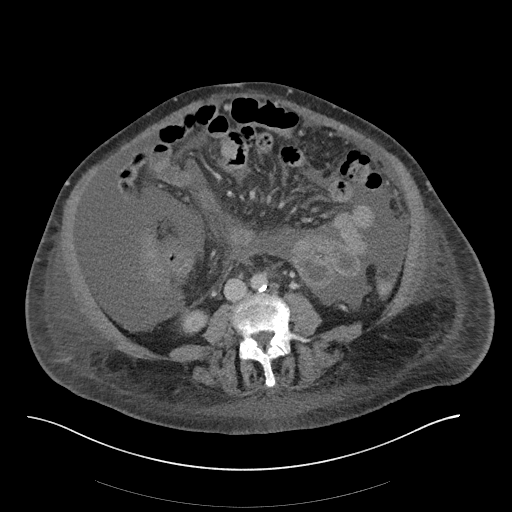
[im 56/93  soft-tissue]
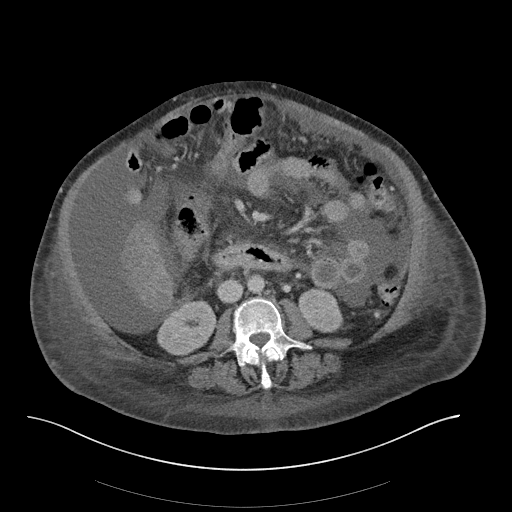
[im 65/93  soft-tissue]
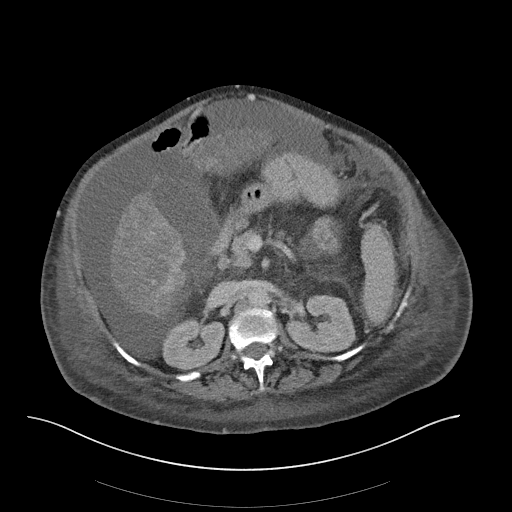
[im 65/93  bone]
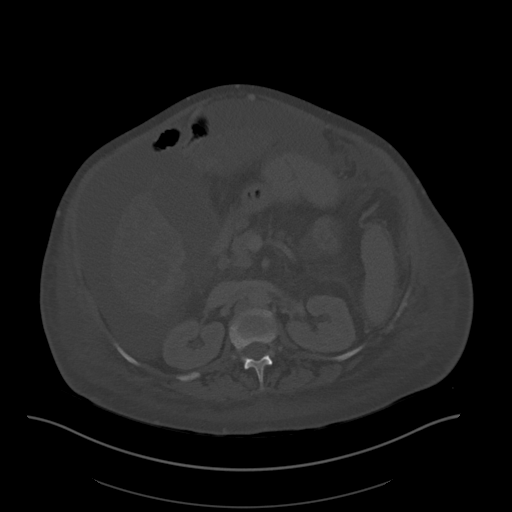
[im 74/93  soft-tissue]
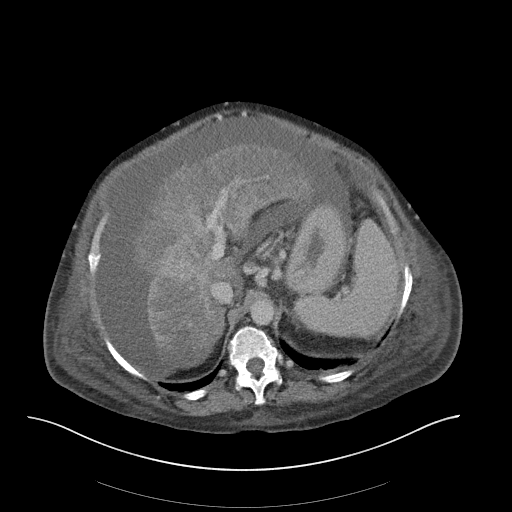
[im 79/93  soft-tissue]
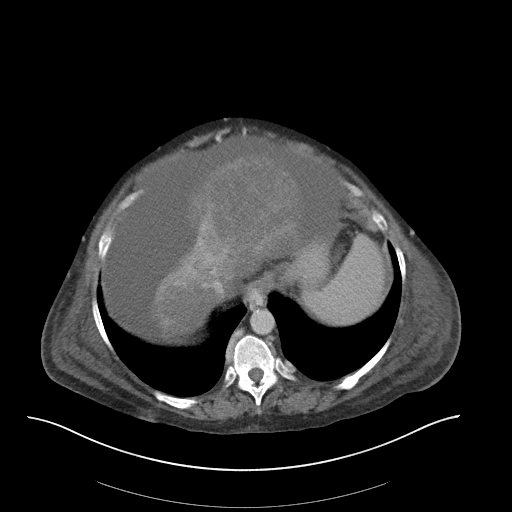
[im 88/93  soft-tissue]
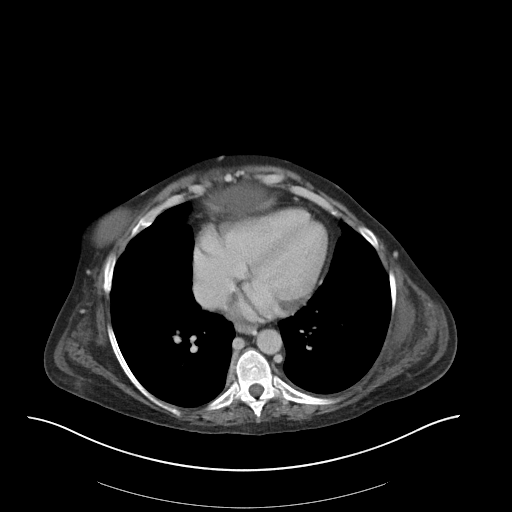

[Series 6: coronal soft tissue · coronal · 0.91mm/px · 3 of 122 slices shown]
[im 41/122  soft-tissue]
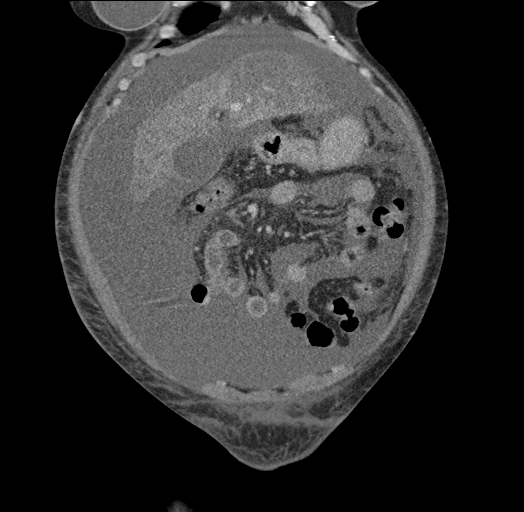
[im 54/122  soft-tissue]
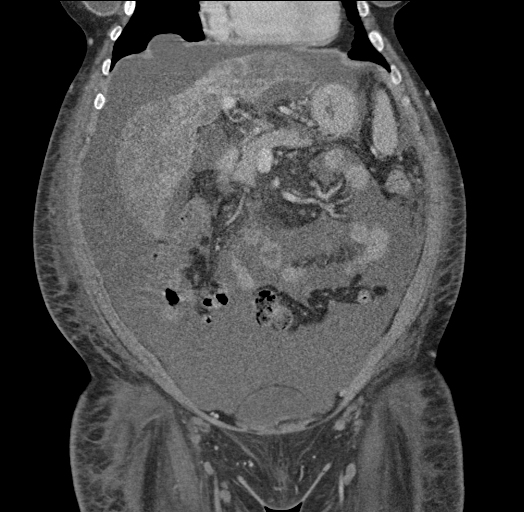
[im 68/122  soft-tissue]
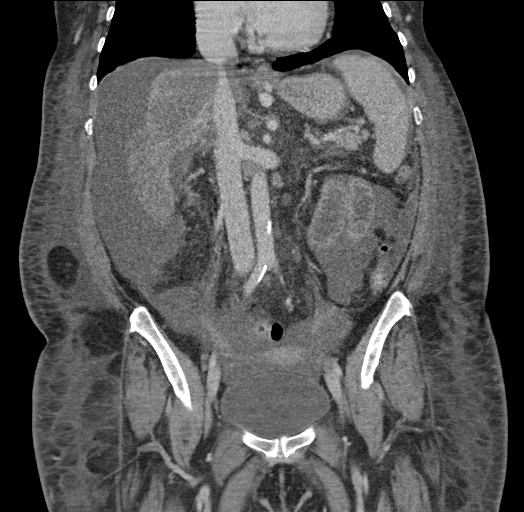

[15 of 46 positions shown; findings below may reference images not displayed]

FINDINGS: Lower chest: On axial slice 1 series 5, there is a nodular opacity
in the inferior lingula measuring 5 mm. There is mild associated
scarring in this area. The lung bases elsewhere are clear.

Hepatobiliary: The liver is markedly diminished in size and contour,
with significant reduction in liver volume compared to less than 3
months prior. There is diffuse dysplastic change throughout the
remaining liver. No enhancing liver lesions are evident on this
study. Remaining liver has a nodular contour. These are changes
indicative of advanced cirrhosis. Gallbladder appears mildly
distended. There is no gallbladder wall thickening. No biliary duct
dilatation is evident.

Pancreas: There is no pancreatic mass or inflammatory focus.

Spleen: No splenic lesions are evident. Spleen is not overtly
enlarged.

Adrenals/Urinary Tract: Adrenals bilaterally appear normal. Kidneys
bilaterally show no evident mass or hydronephrosis on either side.
No renal or ureteral calculus is evident. Urinary bladder is midline
with wall thickness within normal limits.

Stomach/Bowel: There is no appreciable bowel wall or mesenteric
thickening. There is no evident bowel obstruction. There is no free
air or portal venous air.

Vascular/Lymphatic: There is aortic and iliac artery
atherosclerosis. No aneurysm evident. There are esophageal varices
as well as perigastric varices. There is umbilical vein
recanalization with opacification in the region of the umbilicus,
likely with caput medusae type pattern. There is no adenopathy in
the abdomen or pelvis.

Reproductive: Uterus is anteverted.  No pelvic masses evident.

Other: There is extensive ascites throughout the abdomen and pelvis.
There is diffuse anasarca involving the abdominal and pelvic walls.
There is no periappendiceal region inflammatory change. No abscess
is evident in the abdomen or pelvis.

Musculoskeletal: No blastic or lytic bone lesions. There is
extensive muscle atrophy bilaterally. Breast implants noted
bilaterally.
IMPRESSION: 1. Advanced hepatic cirrhosis. The liver is much smaller in
irregular in contour compared to less than 3 months prior. Extensive
liver dysplastic change evident. No enhancing liver mass evident.

2. Extensive varices in the distal esophagus and perigastric
regions. Recanalization of the umbilical vein with vascular
prominence in the umbilical region, likely with caput medusae type
pattern.

3.  Extensive ascites.  Extensive anasarca.

4.  No bowel obstruction.  No abscess in the abdomen or pelvis.

5. No evident renal or ureteral calculus. No hydronephrosis. Urinary
bladder wall thickness normal.

6.  There is aortoiliac atherosclerosis.

7. 5 mm nodular opacity in the inferior lingula. No follow-up needed
if patient is low-risk. Non-contrast chest CT can be considered in
12 months if patient is high-risk. This recommendation follows the
consensus statement: Guidelines for Management of Incidental
Pulmonary Nodules Detected on CT Images: From the [HOSPITAL]

## 2021-06-01 IMAGING — DX PORTABLE CHEST - 1 VIEW
1 series · 1 of 1 positions shown · non-contrast
Comparison: 06/30/2018

CLINICAL DATA: Hypoxia

EXAM:
PORTABLE CHEST 1 VIEW

[chest ap]
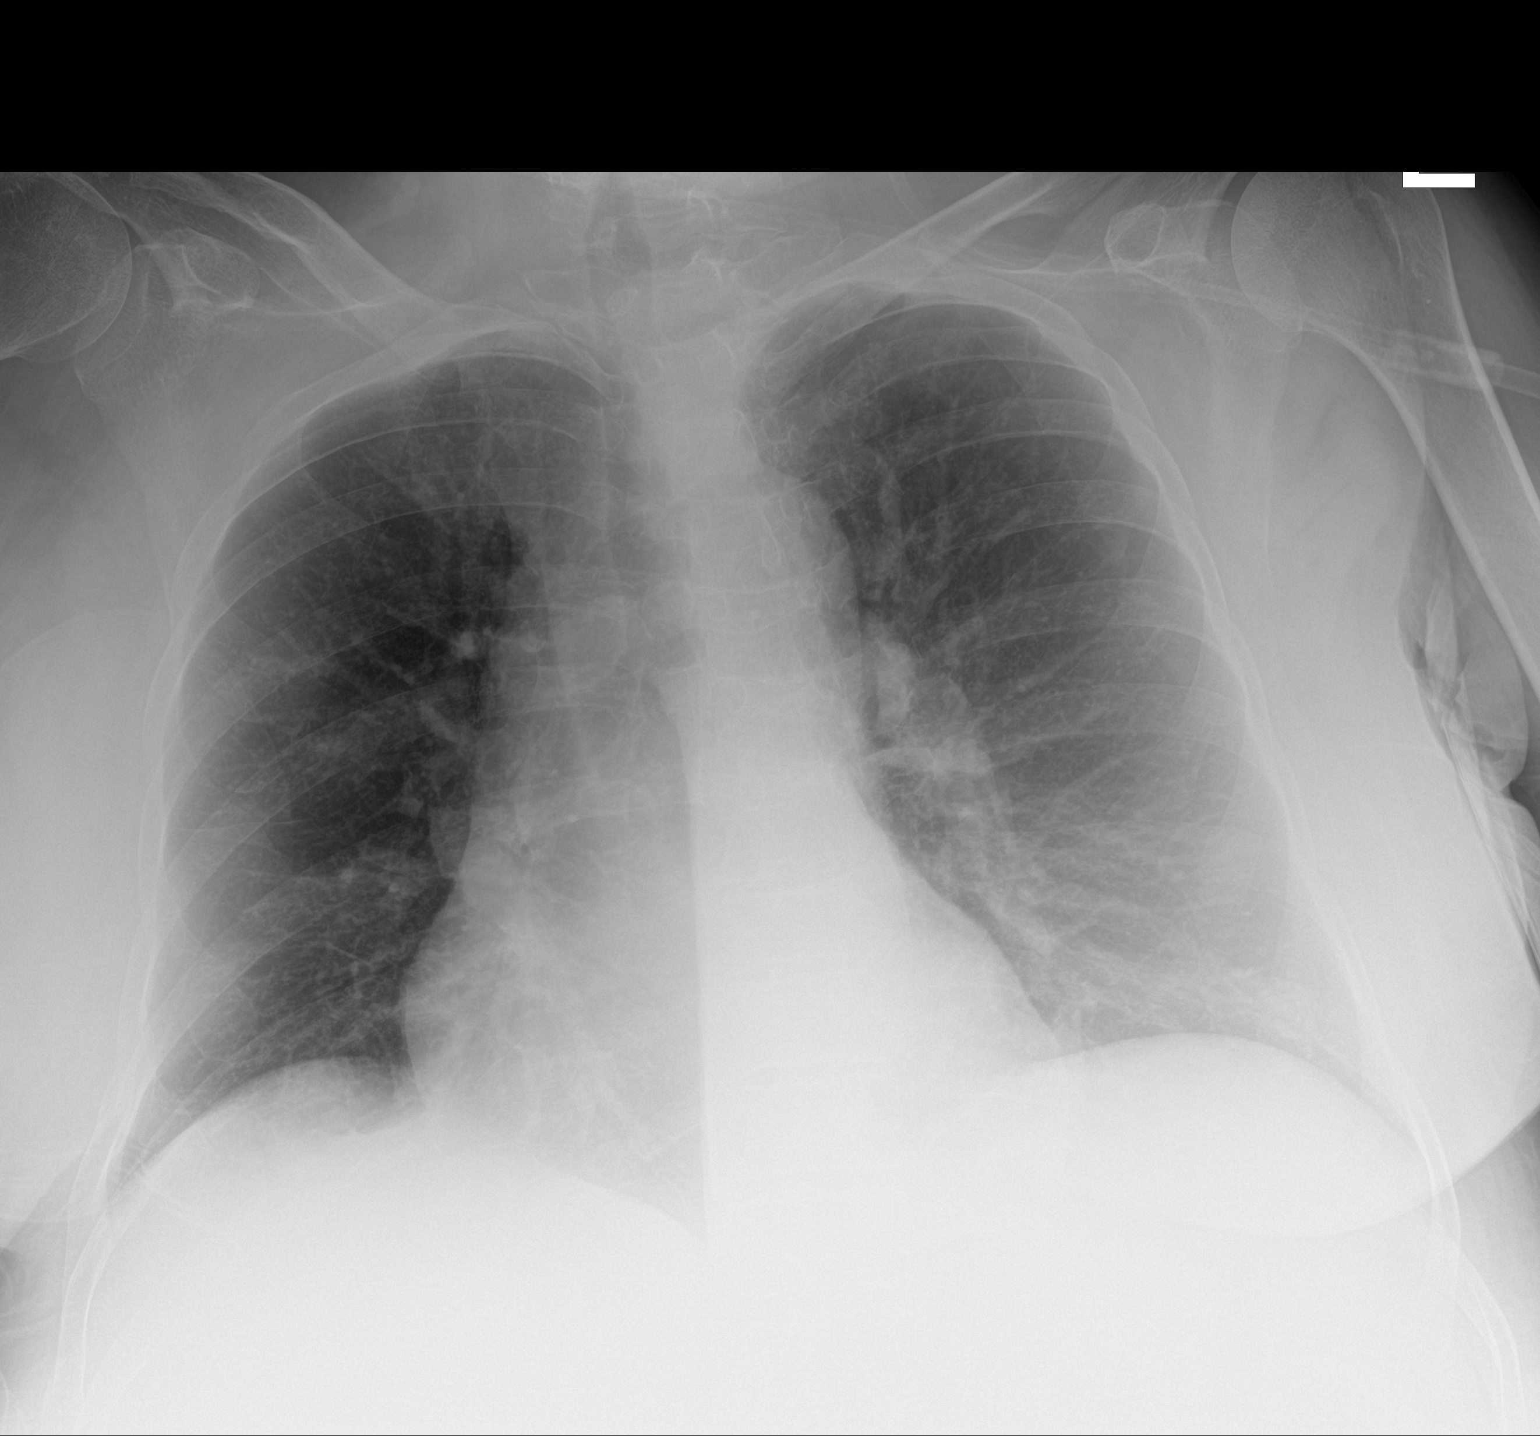

[1 of 1 positions shown; findings below may reference images not displayed]

FINDINGS: Heart and mediastinal contours are within normal limits. No focal
opacities or effusions. No acute bony abnormality.
IMPRESSION: No active disease.

## 2021-06-19 IMAGING — DX PORTABLE CHEST - 1 VIEW
1 series · 1 of 1 positions shown · non-contrast
Comparison: July 23, 2018

CLINICAL DATA: Shortness of breath.  Abdominal pain.

EXAM:
PORTABLE CHEST 1 VIEW

[chest ap]
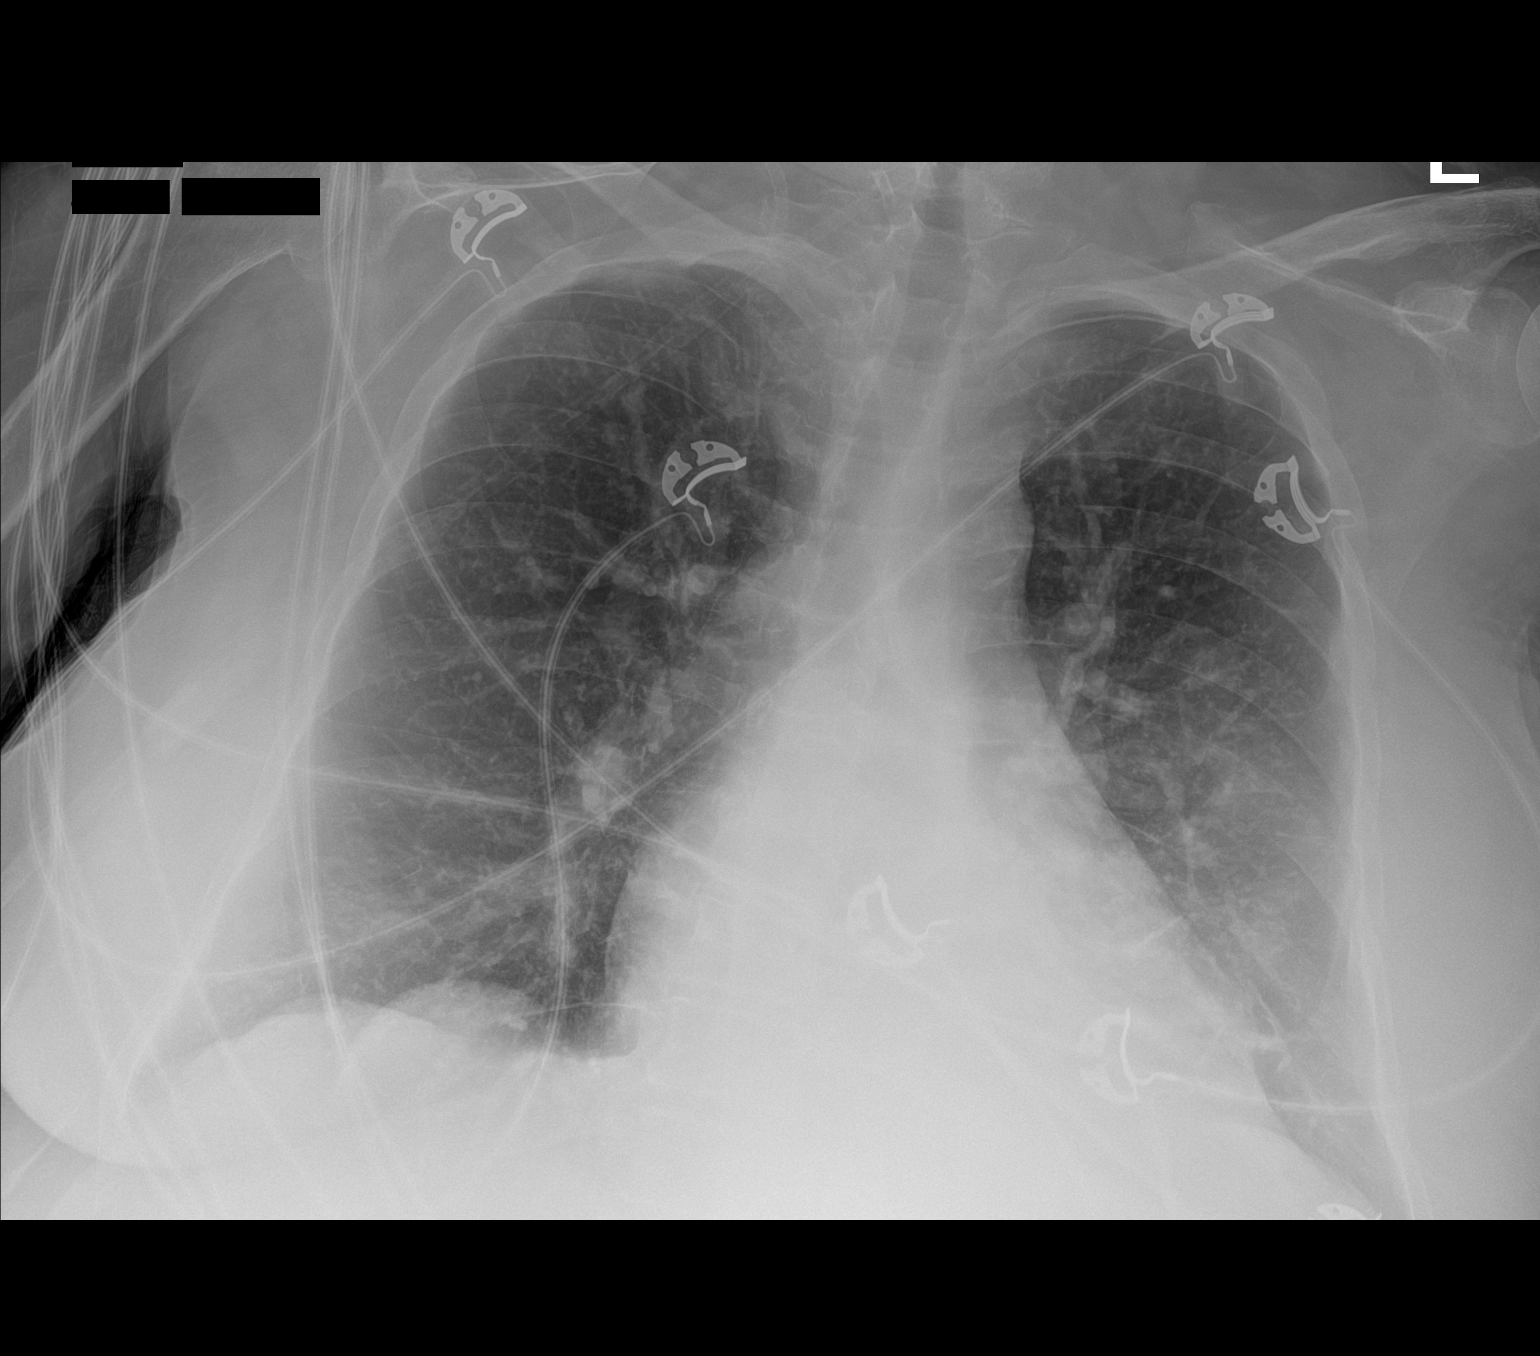

[1 of 1 positions shown; findings below may reference images not displayed]

FINDINGS: Heart size remains enlarged. There are progressive interstitial lung
markings. There is a new dense retrocardiac opacity. Hazy airspace
opacities are noted bilaterally. There may be small bilateral
pleural effusions. There is no acute osseous abnormality. No
pneumothorax.
IMPRESSION: 1. Cardiomegaly with findings suggestive of worsening volume
overload.
2. Worsening retrocardiac opacity which may represent atelectasis or
infiltrate.
3. Probable trace to small bilateral pleural effusions.

## 2021-06-20 IMAGING — US ULTRASOUND ABDOMEN LIMITED
1 series · 13 of 13 positions shown · non-contrast
Comparison: CT scan of the abdomen and pelvis 07/19/2018

CLINICAL DATA: 57-year-old female with hepatic cirrhosis, portal
hypertension and large volume ascites

EXAM:
LIMITED ABDOMEN ULTRASOUND FOR ASCITES
TECHNIQUE: Limited ultrasound survey for ascites was performed in all four
abdominal quadrants.

[Series 1: ultrasound abdomen limited · 13 of 13 slices shown]
[im 1/13]
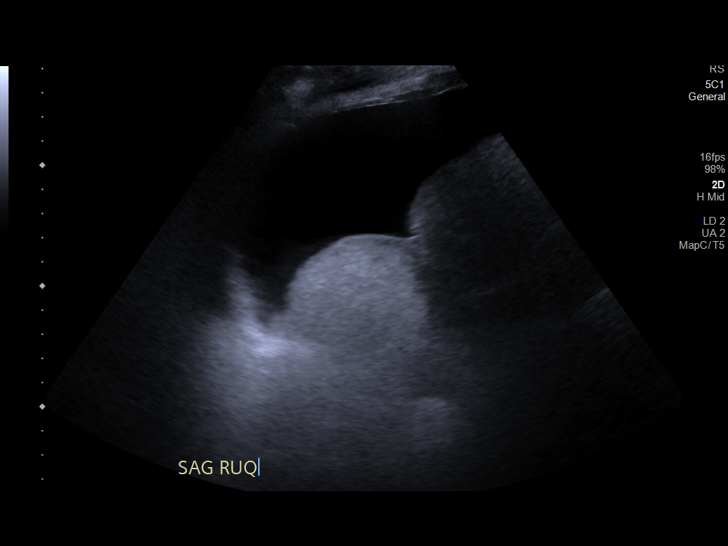
[im 2/13]
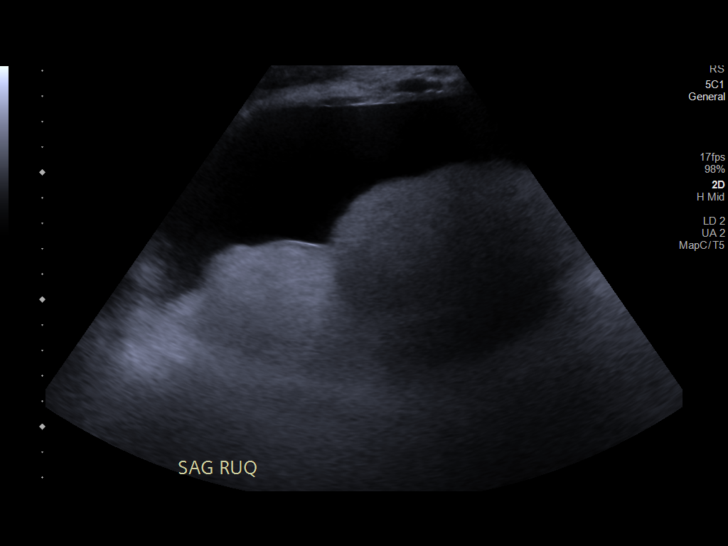
[im 3/13]
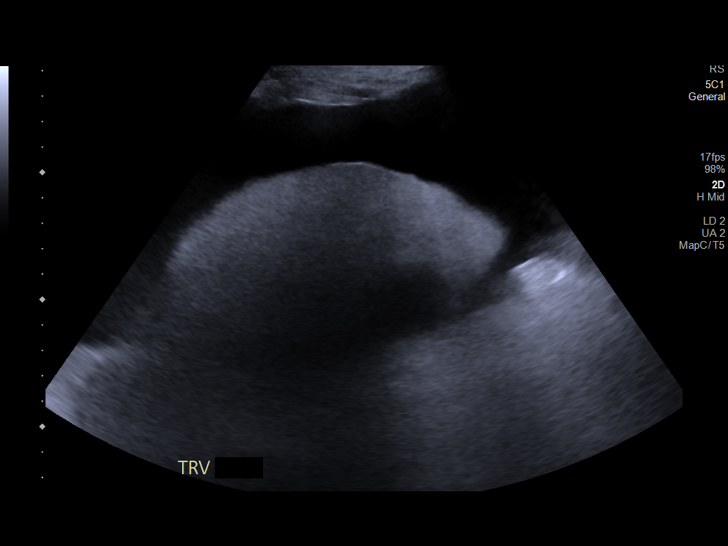
[im 4/13]
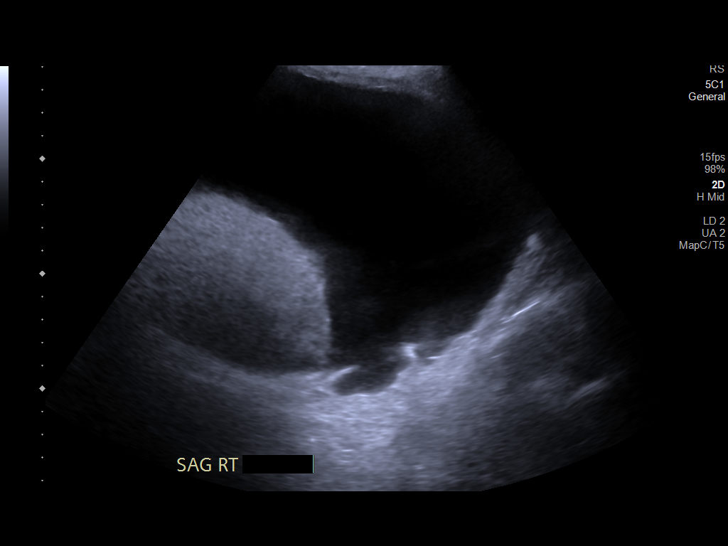
[im 5/13]
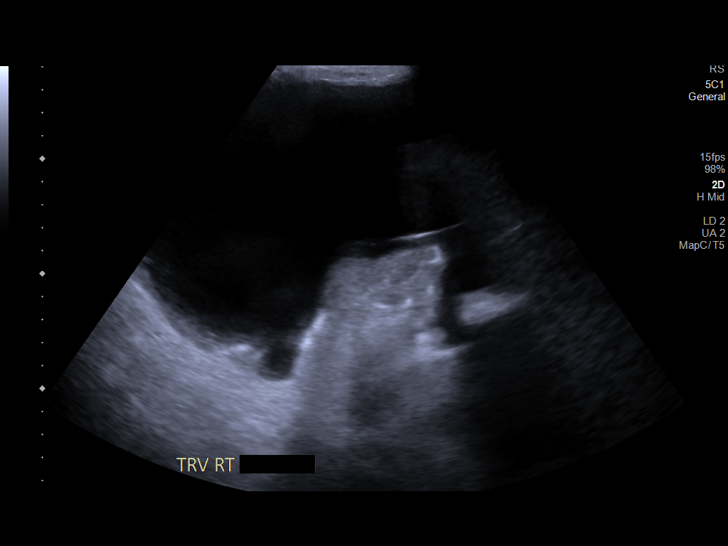
[im 6/13]
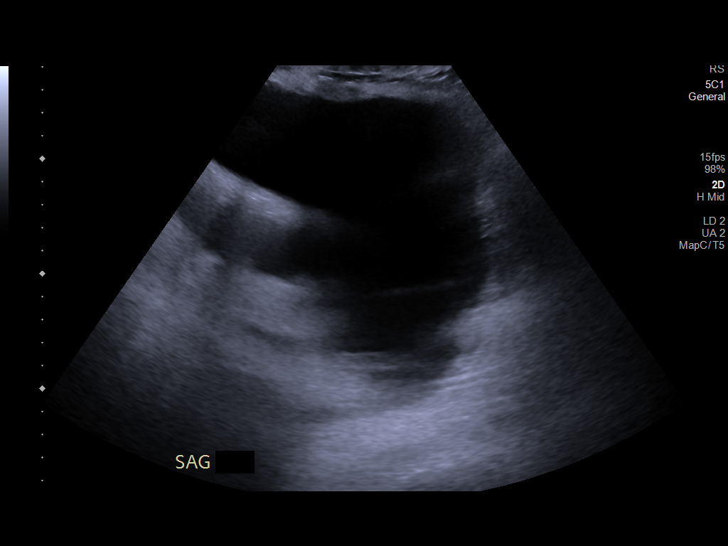
[im 7/13]
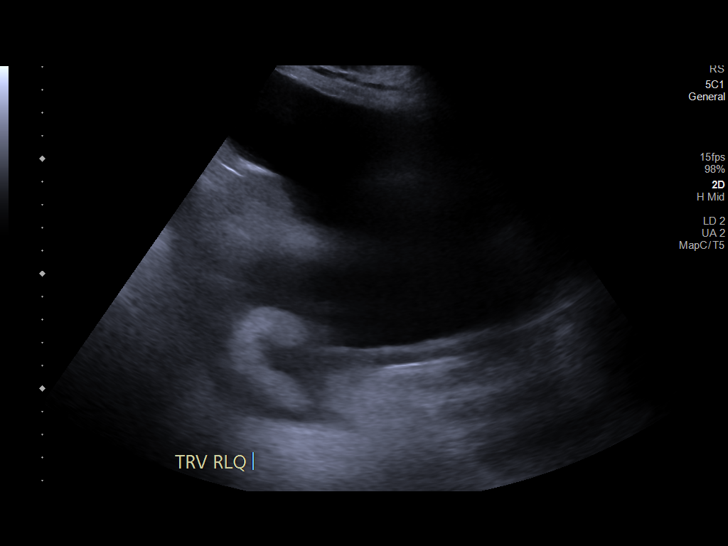
[im 8/13]
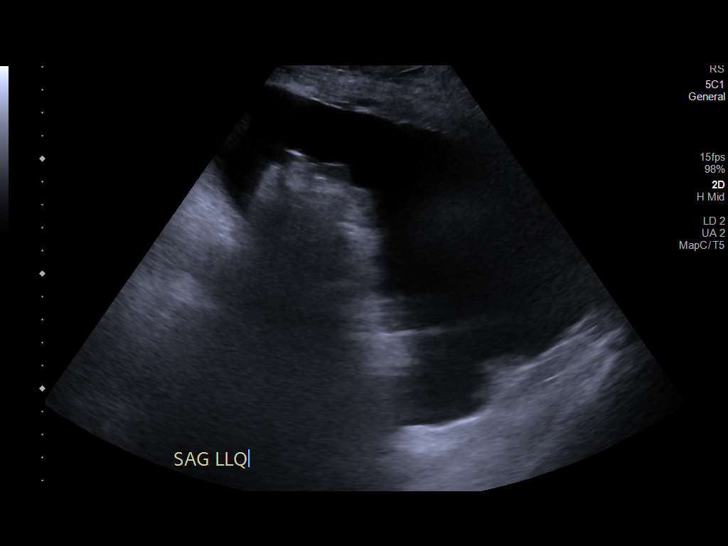
[im 9/13]
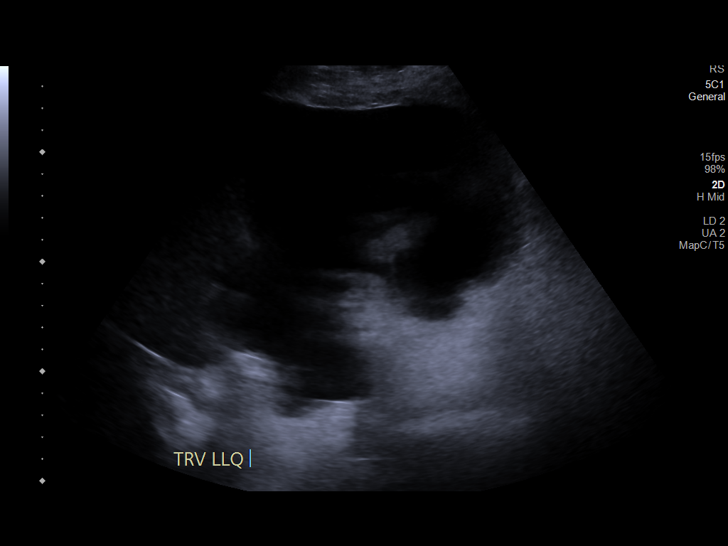
[im 10/13]
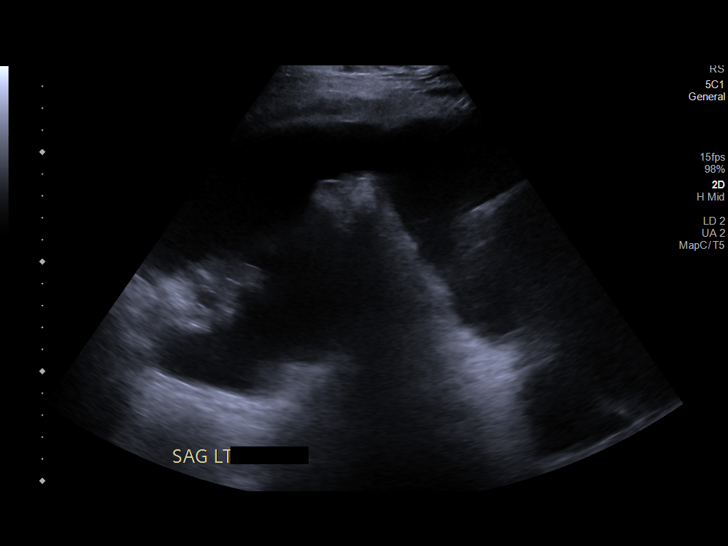
[im 11/13]
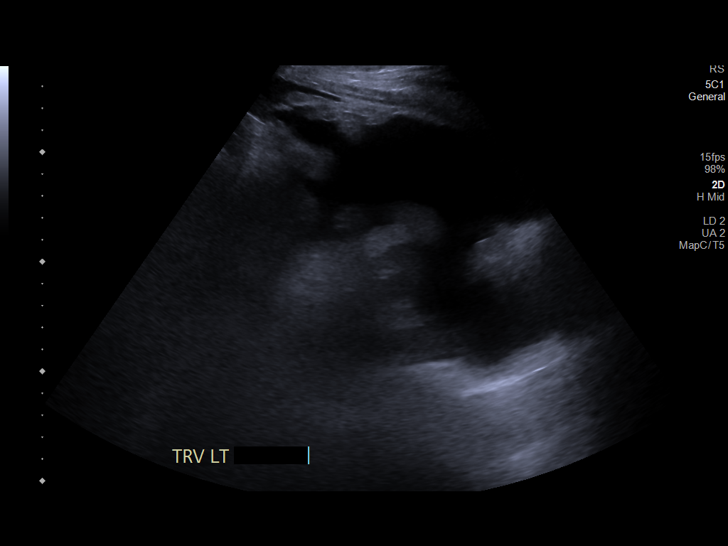
[im 12/13]
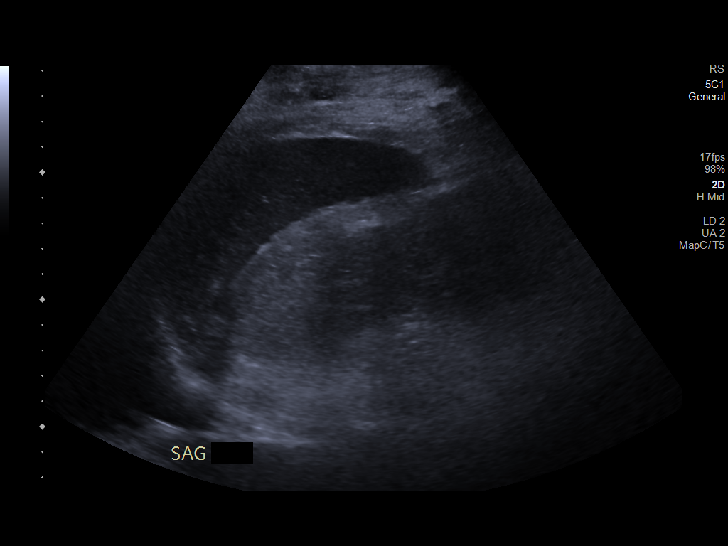
[im 13/13]
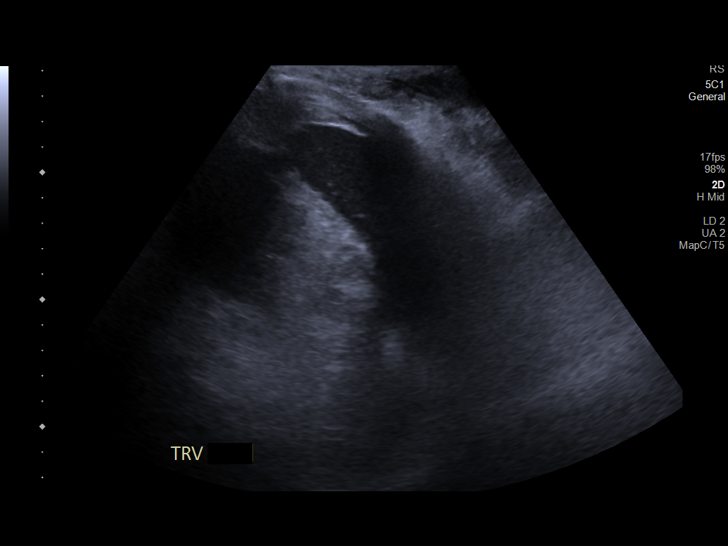

[13 of 13 positions shown; findings below may reference images not displayed]

FINDINGS: Sonographic interrogation of the 4 quadrants of the abdomen
demonstrates large volume ascites. The patient refused paracentesis.
IMPRESSION: Large volume ascites.

Patient refused paracentesis.

## 2021-06-21 IMAGING — US PARACENTESIS WITH ULTRASOUND GUIDANCE
1 series · 5 of 5 positions shown · non-contrast
Comparison: none

INDICATION: Patient with recurrent ascites. Request made for diagnostic and
therapeutic paracentesis.

[Series 1: paracentesis with ultrasound guidance · 5 of 5 slices shown]
[im 1/5]
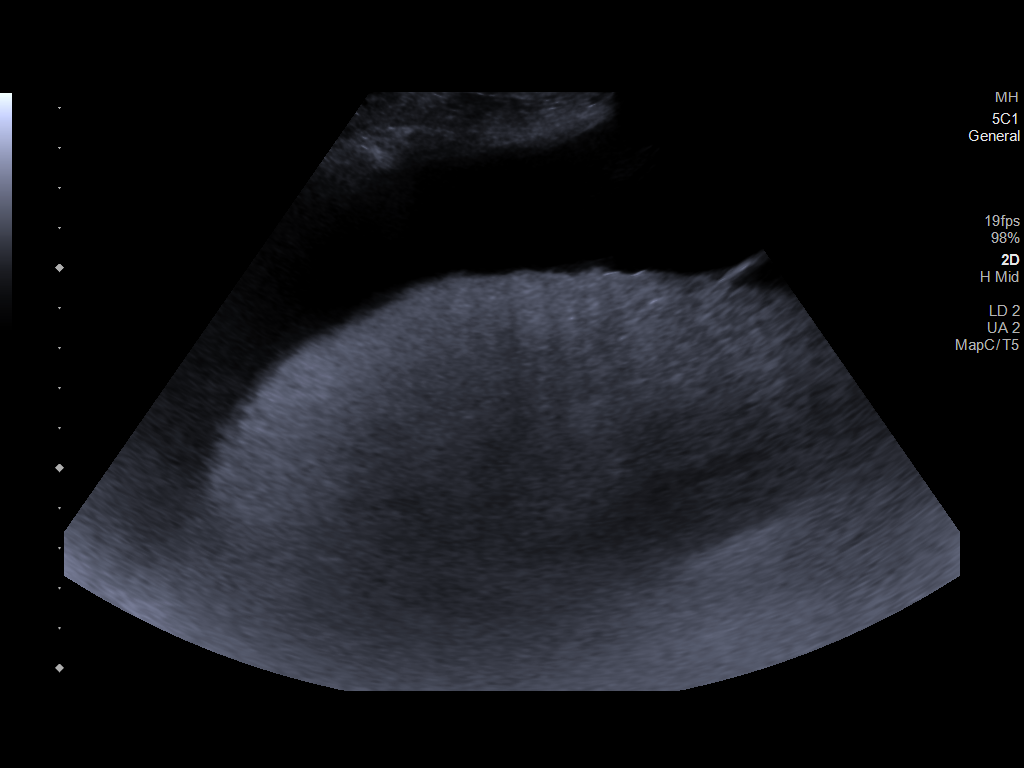
[im 2/5]
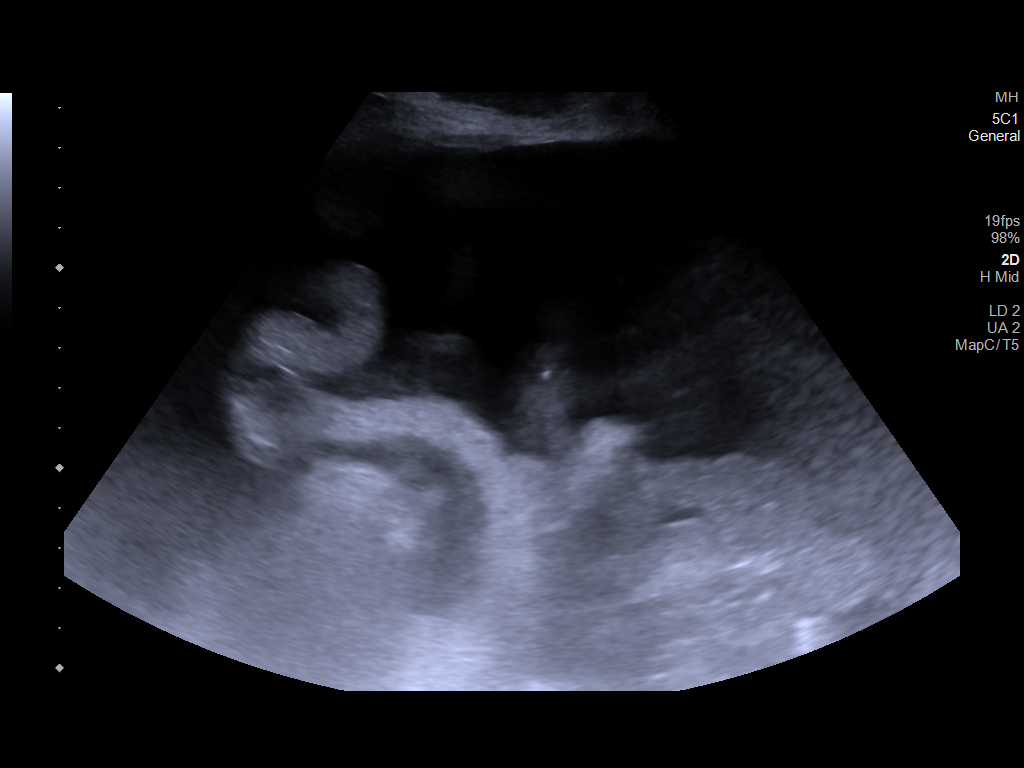
[im 3/5]
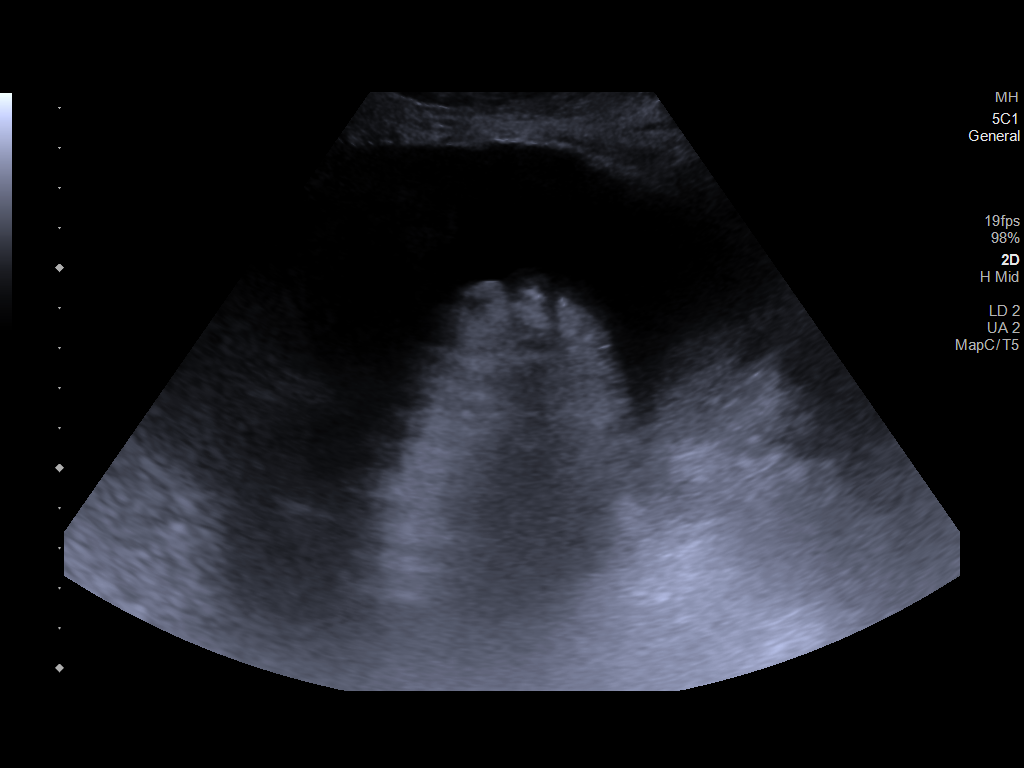
[im 4/5]
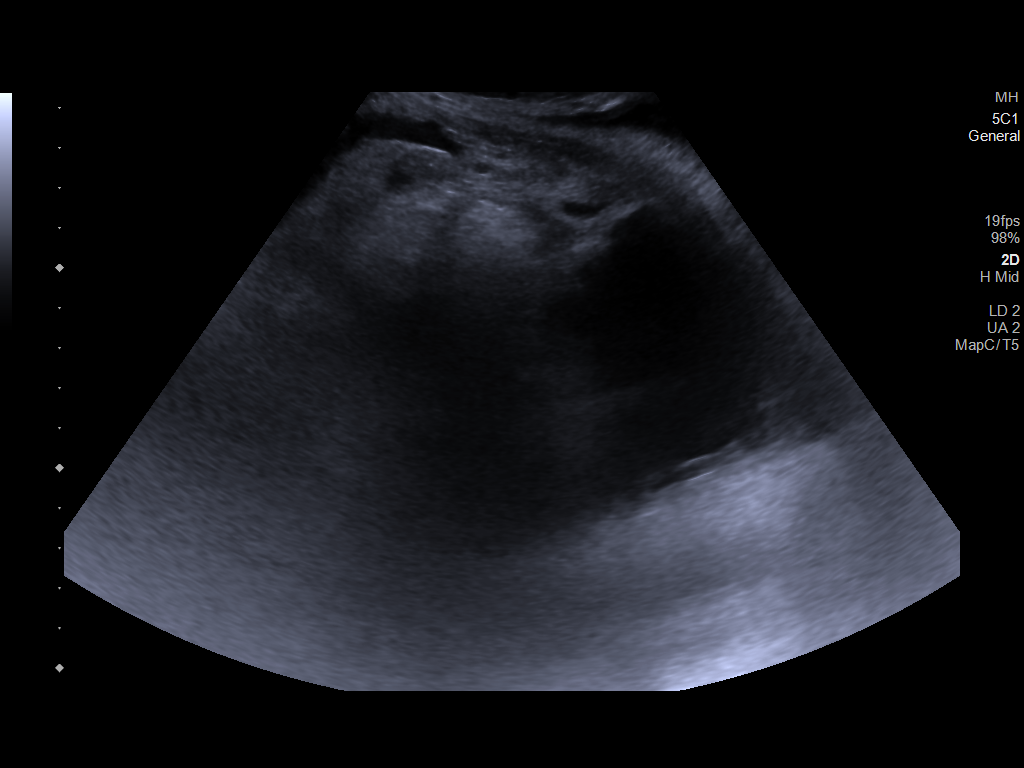
[im 5/5]
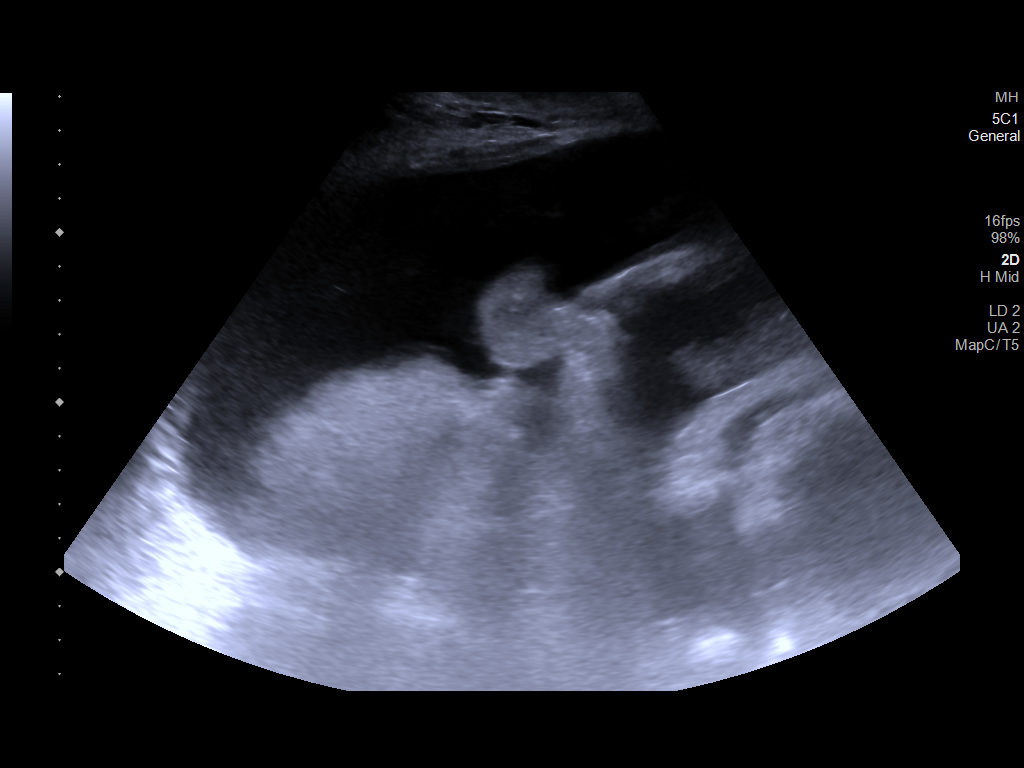

[5 of 5 positions shown; findings below may reference images not displayed]

EXAM:
ULTRASOUND GUIDED DIAGNOSTIC AND THERAPEUTIC PARACENTESIS

MEDICATIONS:
10 mL 1% lidocaine

COMPLICATIONS:
None immediate.

PROCEDURE:
Informed written consent was obtained from the patient after a
discussion of the risks, benefits and alternatives to treatment. A
timeout was performed prior to the initiation of the procedure.

Initial ultrasound scanning demonstrates a moderate amount of
ascites within the right lower abdominal quadrant. The right lower
abdomen was prepped and draped in the usual sterile fashion. 1%
lidocaine was used for local anesthesia.

Following this, a 19 gauge, 7-cm, Yueh catheter was introduced. An
ultrasound image was saved for documentation purposes. The
paracentesis was performed. The catheter was removed and a dressing
was applied. The patient tolerated the procedure well without
immediate post procedural complication.
FINDINGS: A total of approximately 2.7 liters of clear, yellow fluid was
removed. Samples were sent to the laboratory as requested by the
clinical team. Procedure stopped prior to removal of all fluid due
to hypotension.
IMPRESSION: Successful ultrasound-guided diagnostic and therapeutic paracentesis
yielding 2.7 liters of peritoneal fluid.

## 2021-06-23 IMAGING — US IR PARACENTESIS
1 series · 5 of 5 positions shown · non-contrast
Comparison: none

INDICATION: Patient with history of alcoholic cirrhosis with recurrent ascites.
Request is made for therapeutic paracentesis.

[Series 1: ir (id) (id)/(id)/(id) ir · 5 of 5 slices shown]
[im 1/5]
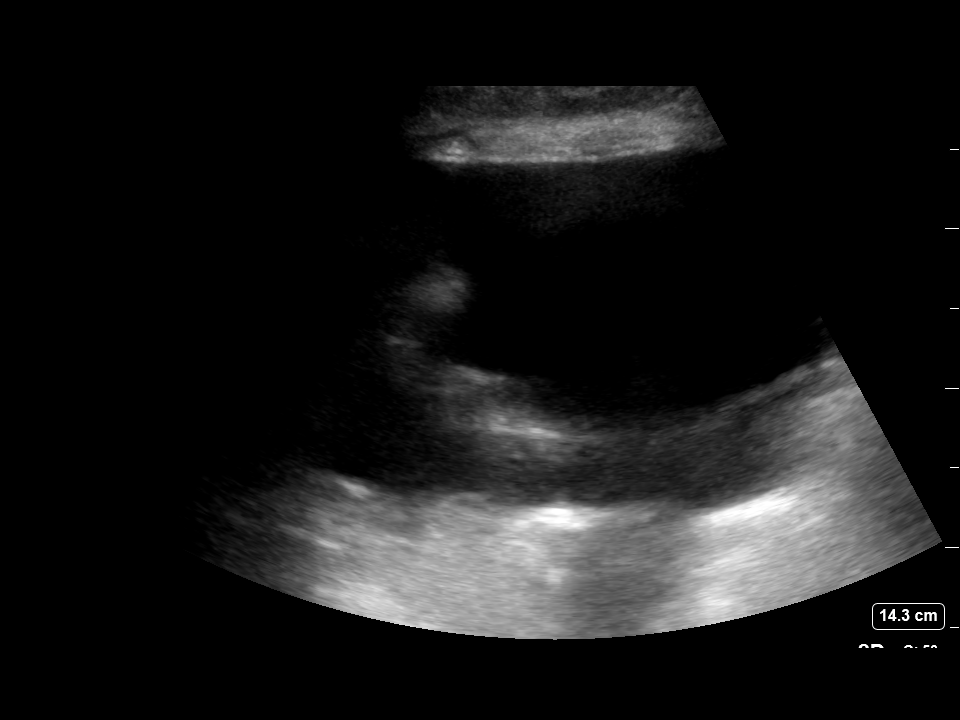
[im 2/5]
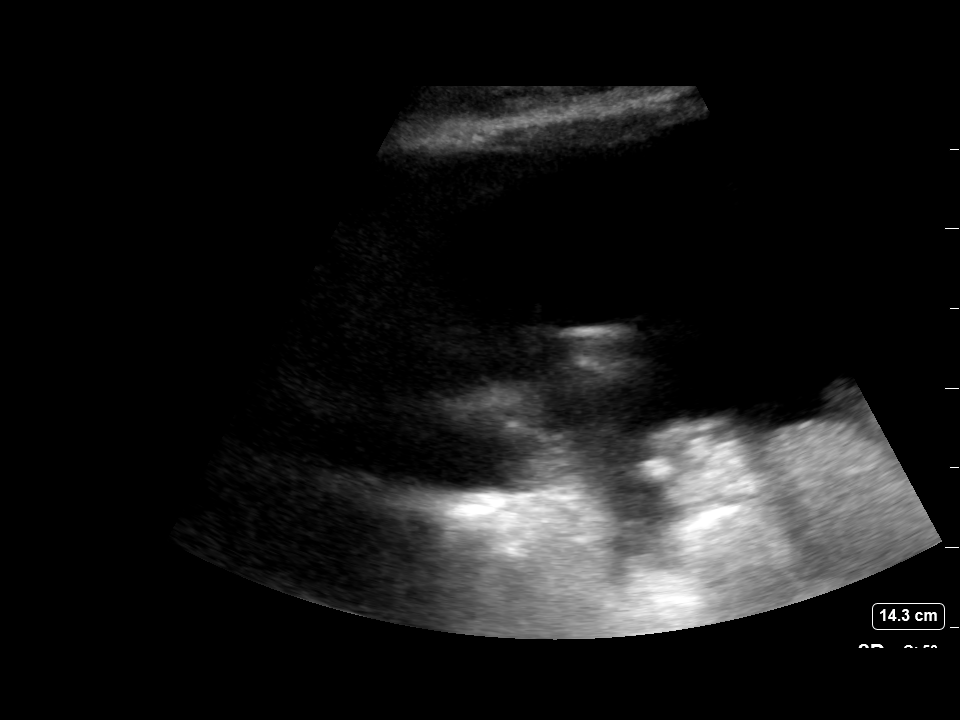
[im 3/5]
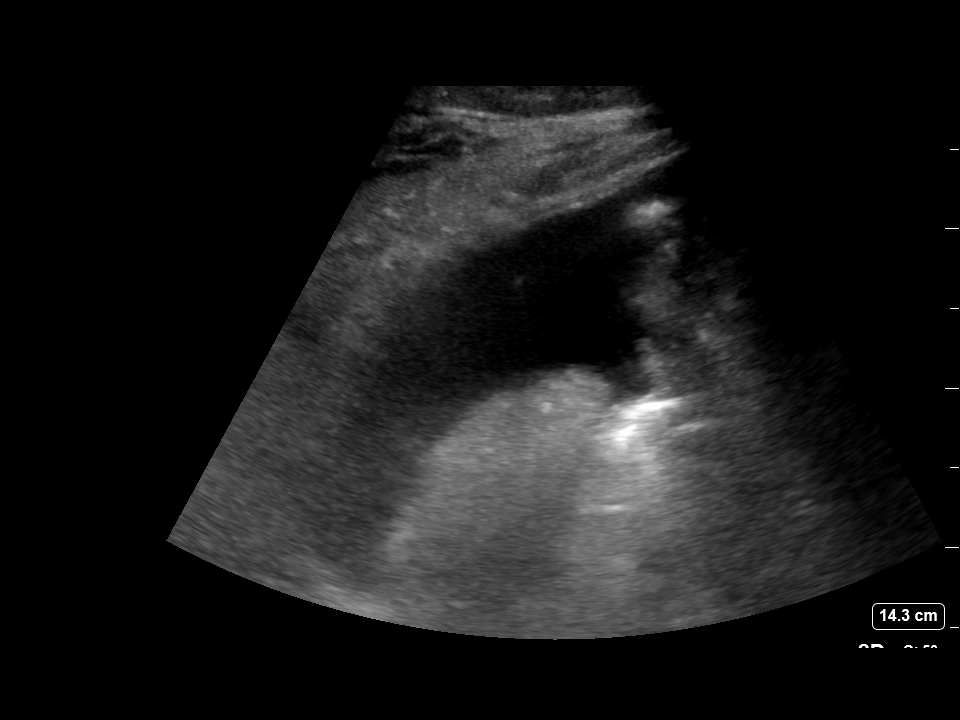
[im 4/5]
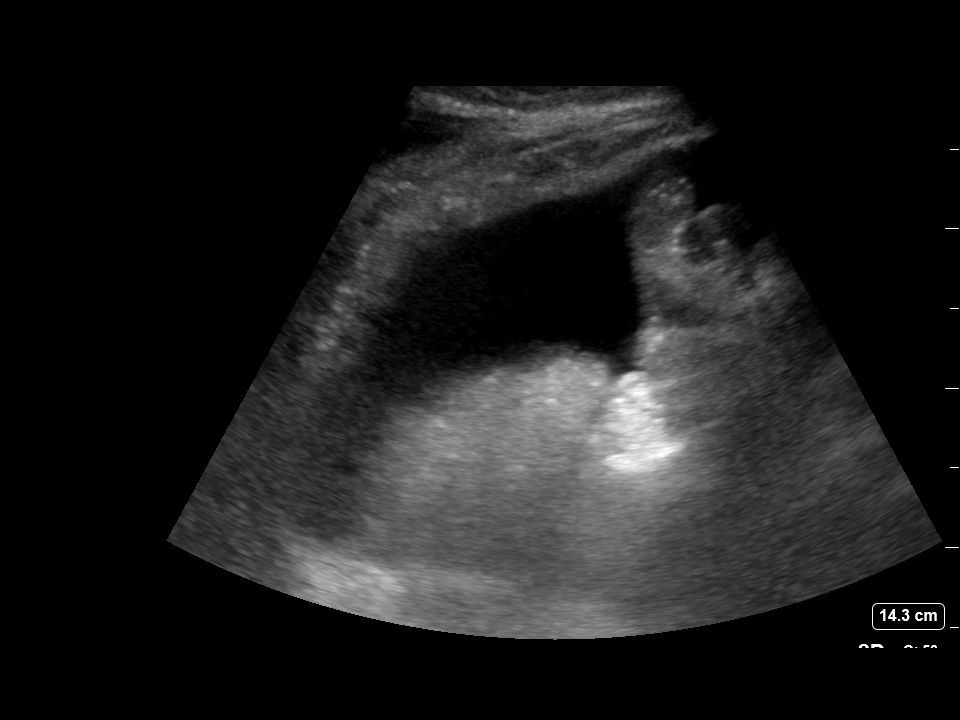
[im 5/5]
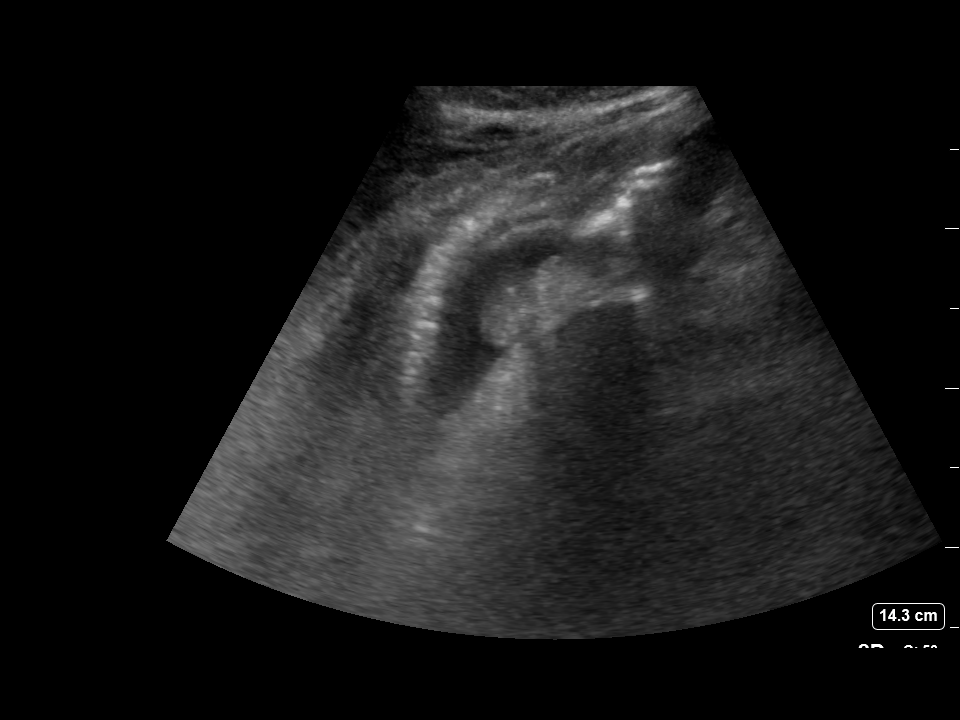

[5 of 5 positions shown; findings below may reference images not displayed]

EXAM:
ULTRASOUND GUIDED THERAPEUTIC PARACENTESIS

MEDICATIONS:
10 mL 1% lidocaine

COMPLICATIONS:
None immediate.

PROCEDURE:
Informed written consent was obtained from the patient after a
discussion of the risks, benefits and alternatives to treatment. A
timeout was performed prior to the initiation of the procedure.

Initial ultrasound scanning demonstrates a large amount of ascites
within the right lower abdominal quadrant. The right lower abdomen
was prepped and draped in the usual sterile fashion. 1% lidocaine
was used for local anesthesia.

Following this, a 19 gauge, 7-cm, Yueh catheter was introduced. An
ultrasound image was saved for documentation purposes. The
paracentesis was performed. The catheter was removed and a dressing
was applied. The patient tolerated the procedure well without
immediate post procedural complication.
FINDINGS: A total of approximately 5.7 L of clear gold fluid was removed.
IMPRESSION: Successful ultrasound-guided paracentesis yielding 5.7 L of
peritoneal fluid.

## 2021-06-27 IMAGING — US IR PARACENTESIS
1 series · 2 of 2 positions shown · non-contrast
Comparison: none

INDICATION: Patient with history of abdominal distension, recurrent ascites.
Request made for paracentesis.

[Series 1: ir (id) (id)/(id)/(id) ir · 2 of 2 slices shown]
[im 1/2]
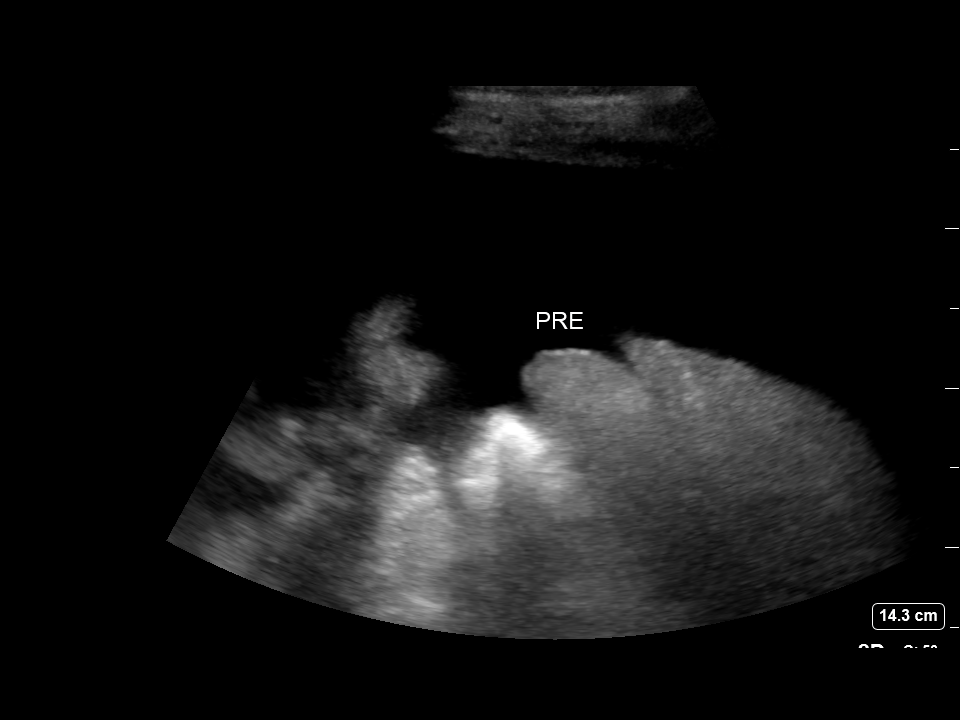
[im 2/2]
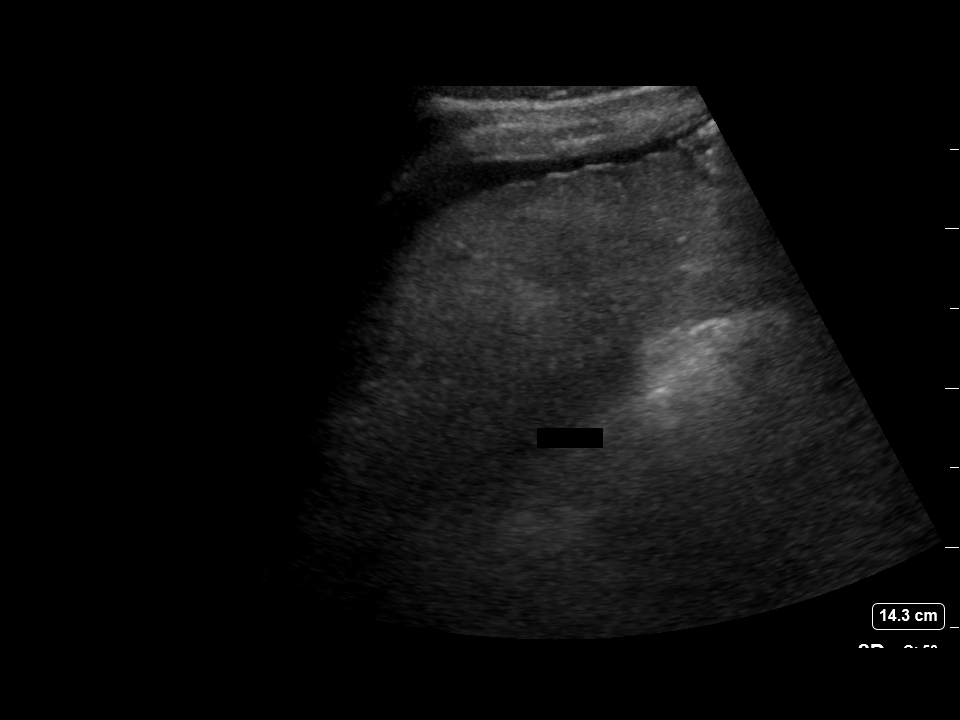

[2 of 2 positions shown; findings below may reference images not displayed]

EXAM:
ULTRASOUND GUIDED THERAPEUTIC PARACENTESIS

MEDICATIONS:
10 mL 1% lidocaine

COMPLICATIONS:
None immediate.

PROCEDURE:
Informed written consent was obtained from the patient after a
discussion of the risks, benefits and alternatives to treatment. A
timeout was performed prior to the initiation of the procedure.

Initial ultrasound scanning demonstrates a large amount of ascites
within the right lower abdominal quadrant. The right lower abdomen
was prepped and draped in the usual sterile fashion. 1% lidocaine
was used for local anesthesia.

Following this, a 19 gauge, 7-cm, Yueh catheter was introduced. An
ultrasound image was saved for documentation purposes. The
paracentesis was performed. The catheter was removed and a dressing
was applied. The patient tolerated the procedure well without
immediate post procedural complication.
FINDINGS: A total of approximately 3.7 liters of yellow fluid was removed.
IMPRESSION: Successful ultrasound-guided therapeutic paracentesis yielding
liters of peritoneal fluid.

## 2021-06-28 IMAGING — DX PORTABLE CHEST - 1 VIEW
1 series · 1 of 1 positions shown · non-contrast
Comparison: August 10, 2018

CLINICAL DATA: Shortness of breath.

EXAM:
PORTABLE CHEST 1 VIEW

[chest ap]
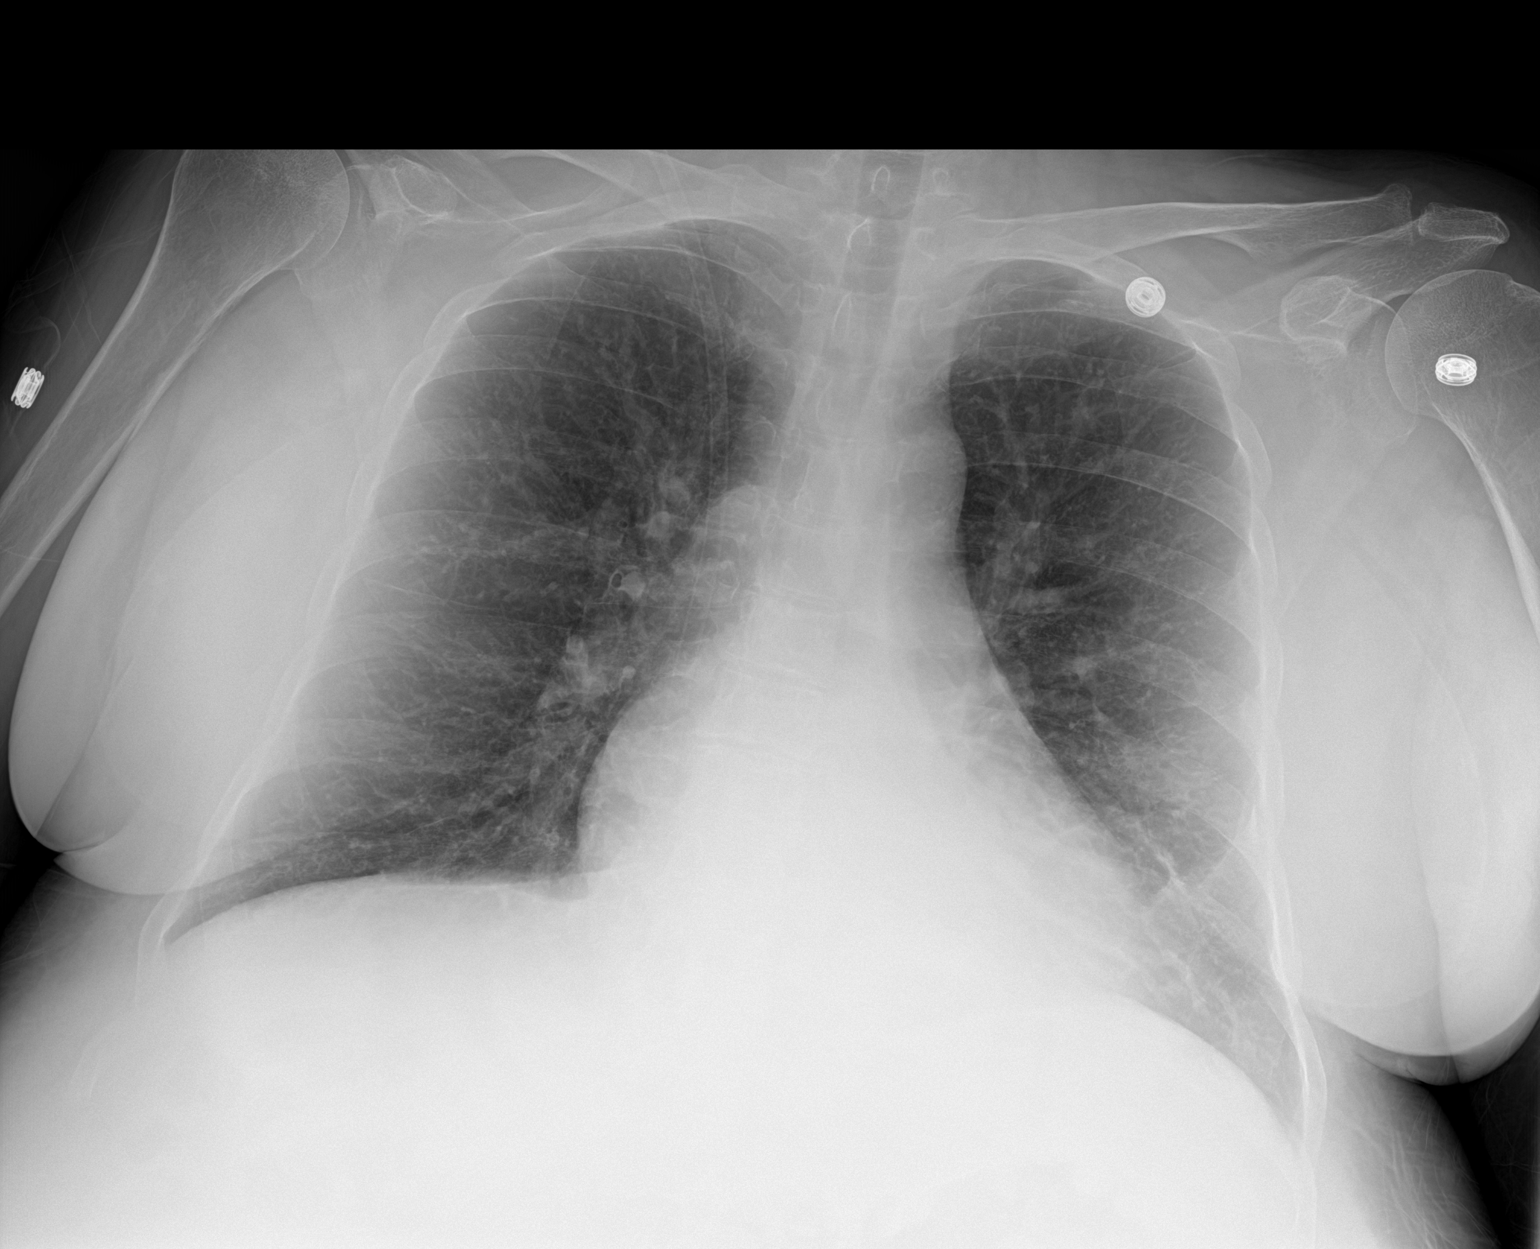

[1 of 1 positions shown; findings below may reference images not displayed]

FINDINGS: Cardiomediastinal silhouette is normal. Mediastinal contours appear
intact.

There is no evidence of focal airspace consolidation, pleural
effusion or pneumothorax.

Osseous structures are without acute abnormality. Soft tissues are
grossly normal.
IMPRESSION: No active disease.

## 2021-07-19 IMAGING — US PARACENTESIS WITH ULTRASOUND GUIDANCE
1 series · 7 of 7 positions shown · non-contrast
Comparison: none

INDICATION: Cirrhosis, abdominal distension, recurrent ascites

[Series 1: paracentesis with ultrasound guidance · 7 of 7 slices shown]
[im 1/7]
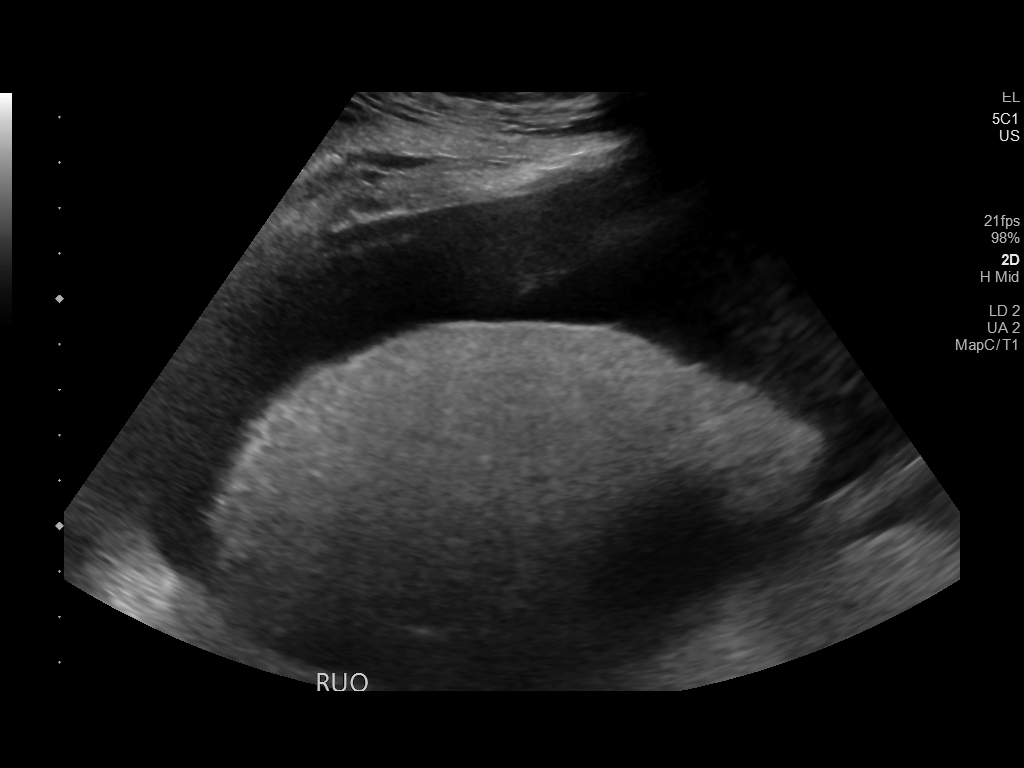
[im 2/7]
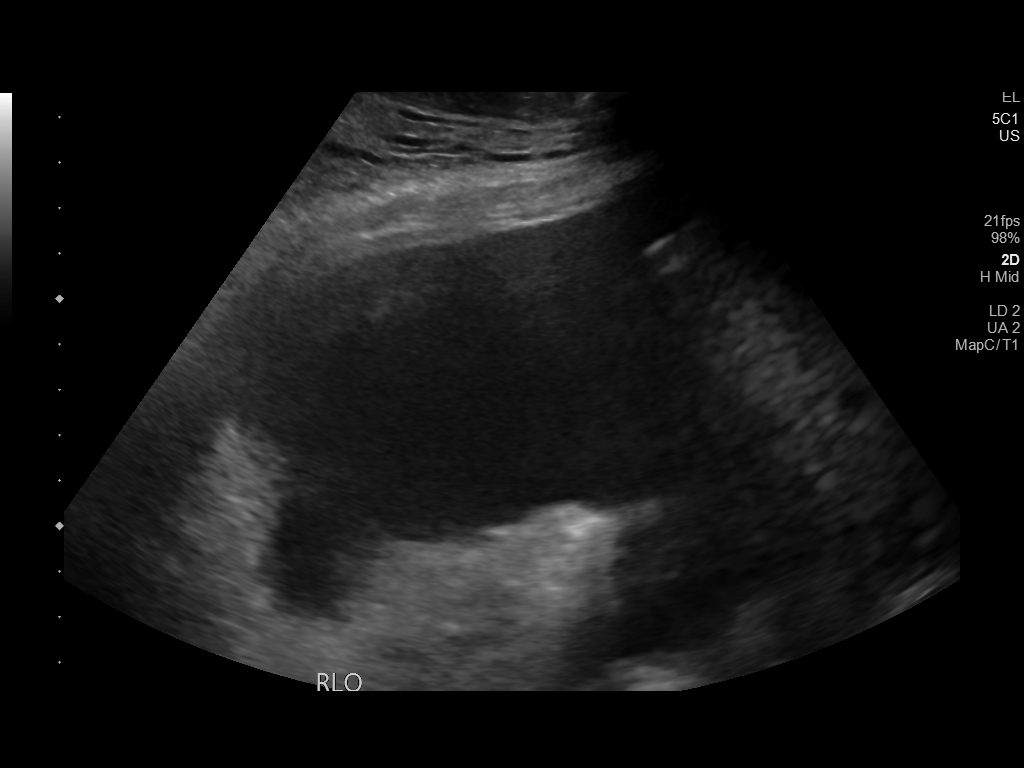
[im 3/7]
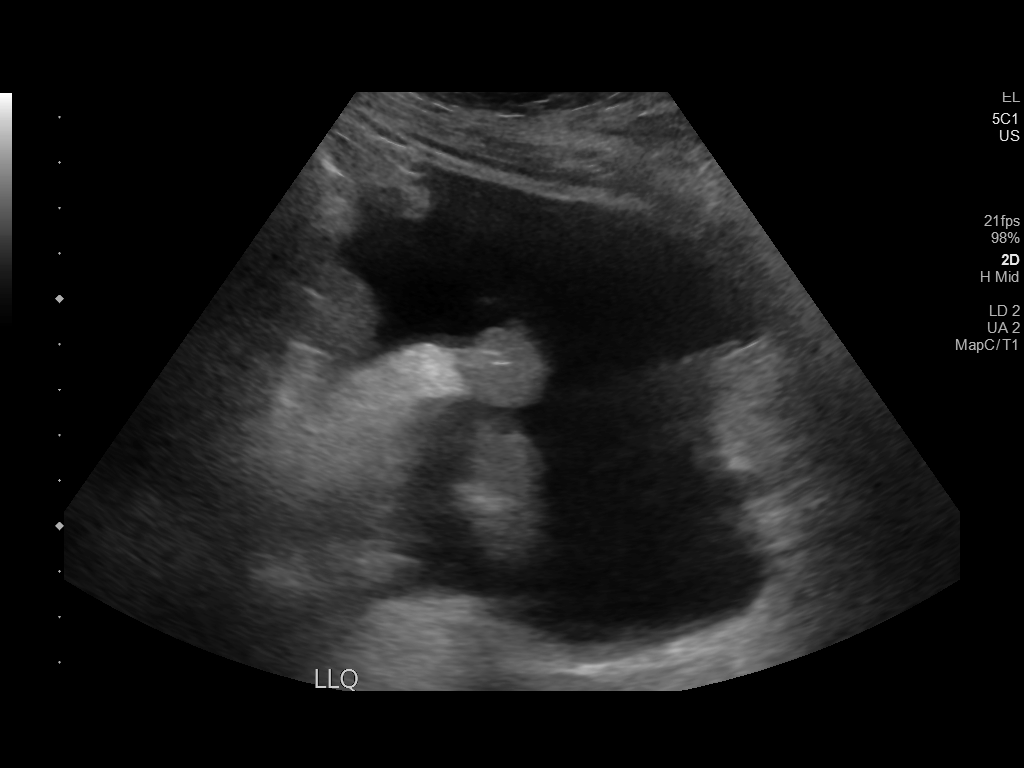
[im 4/7]
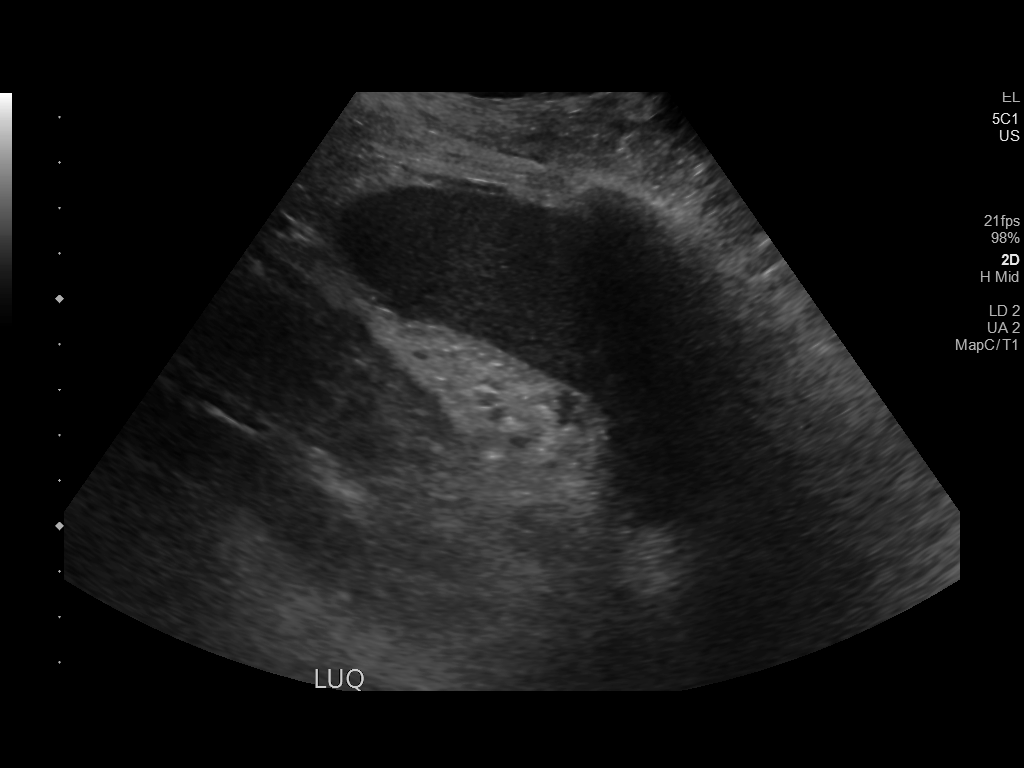
[im 5/7]
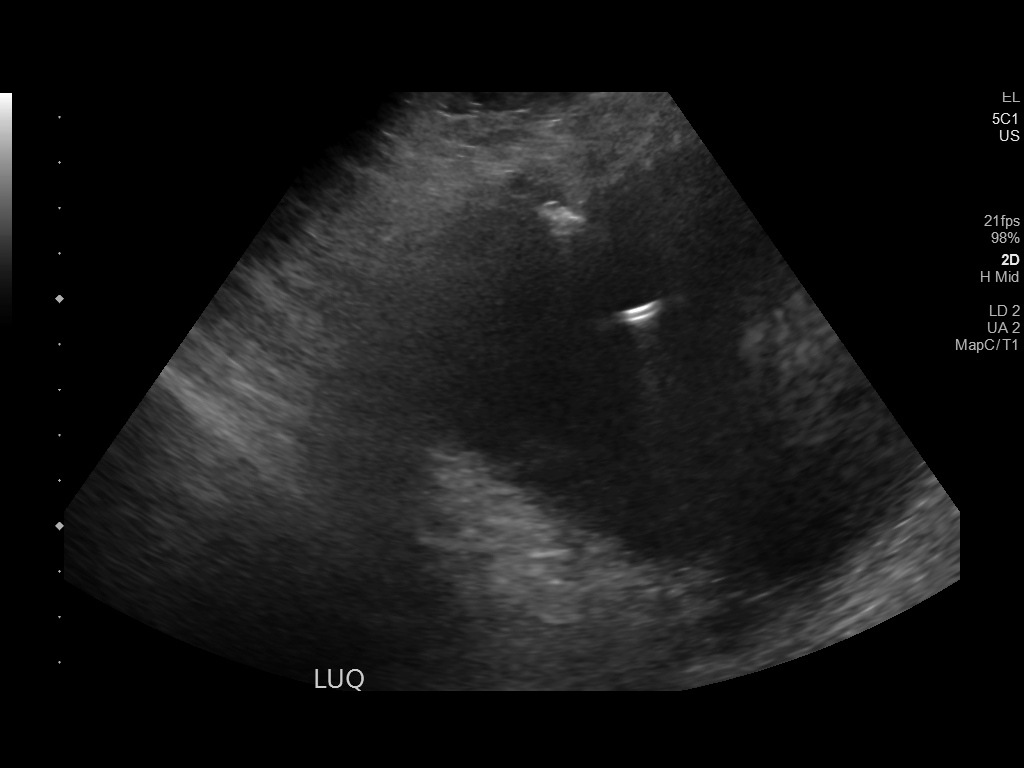
[im 6/7]
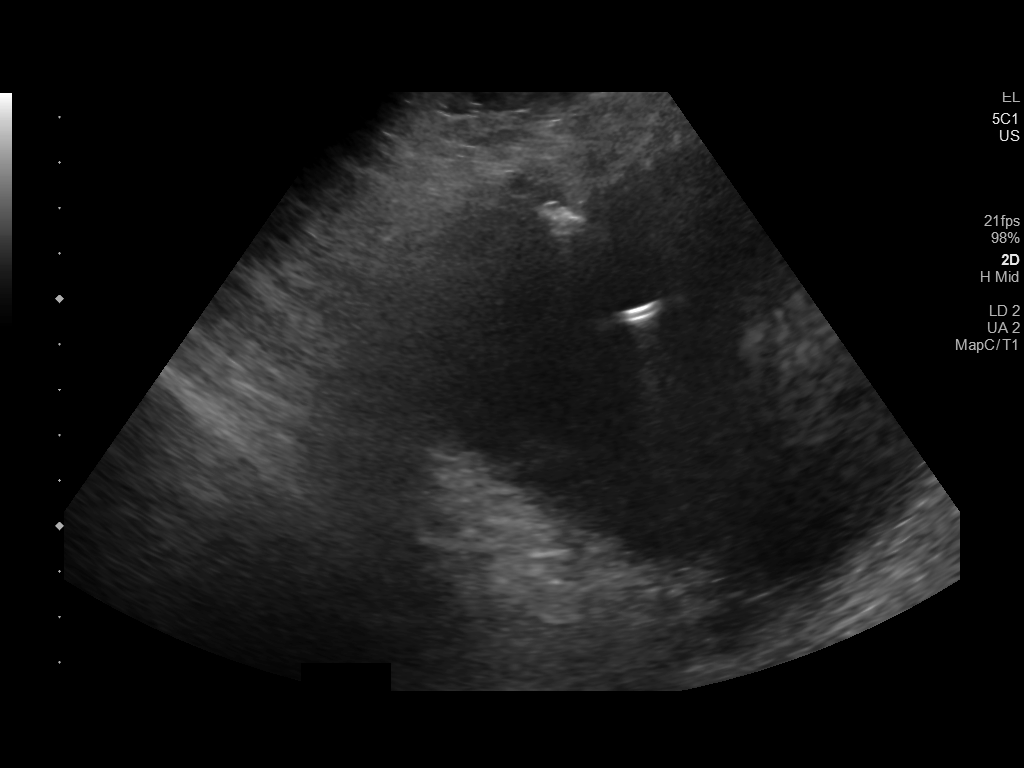
[im 7/7]
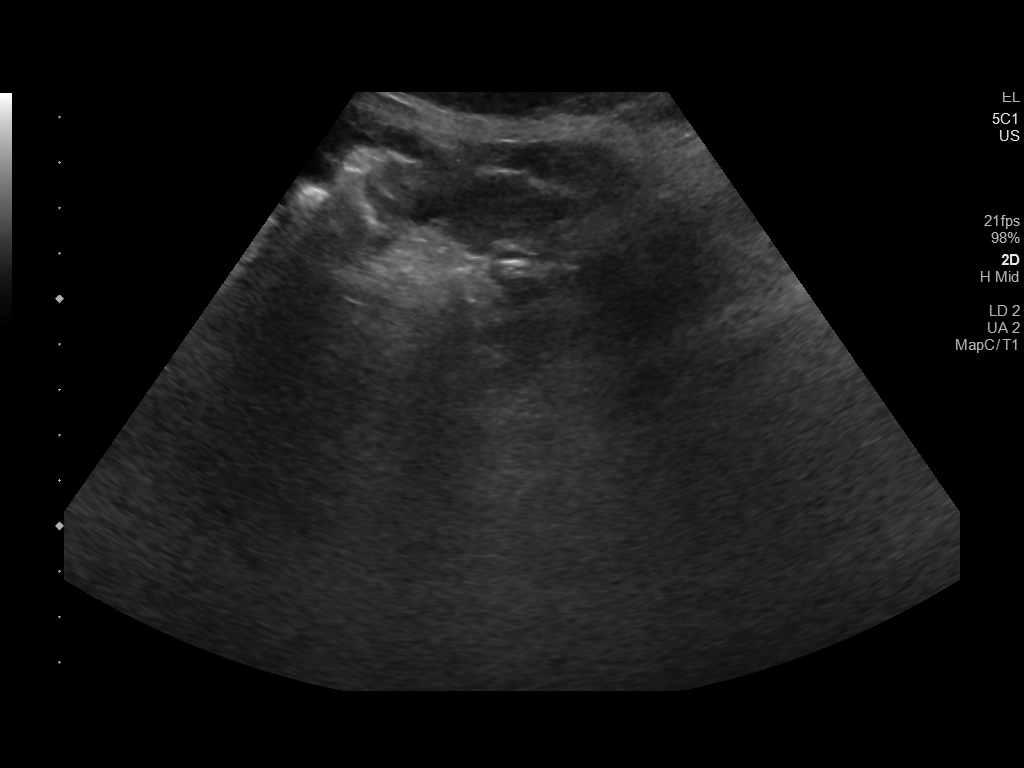

[7 of 7 positions shown; findings below may reference images not displayed]

EXAM:
ULTRASOUND GUIDED THERAPEUTIC PARACENTESIS

MEDICATIONS:
1% lidocaine local

COMPLICATIONS:
None immediate.

PROCEDURE:
Informed written consent was obtained from the patient after a
discussion of the risks, benefits and alternatives to treatment. A
timeout was performed prior to the initiation of the procedure.

Initial ultrasound scanning demonstrates a large amount of ascites
within the right lower abdominal quadrant. The right lower abdomen
was prepped and draped in the usual sterile fashion. 1% lidocaine
with epinephrine was used for local anesthesia.

Following this, a 6 Fr Safe-T-Centesis catheter was introduced. An
ultrasound image was saved for documentation purposes. The
paracentesis was performed. The catheter was removed and a dressing
was applied. The patient tolerated the procedure well without
immediate post procedural complication.
FINDINGS: A total of approximately 6.6 L of clear peritoneal fluid was
removed. Sample was not sent for laboratory analysis
IMPRESSION: Successful ultrasound-guided paracentesis yielding 6.6 liters of
peritoneal fluid.

## 2021-08-02 IMAGING — DX CHEST  1 VIEW
1 series · 1 of 1 positions shown · non-contrast
Comparison: 08/19/2018.

CLINICAL DATA: Altered mental status. Smoker.

EXAM:
CHEST  1 VIEW

[chest ap]
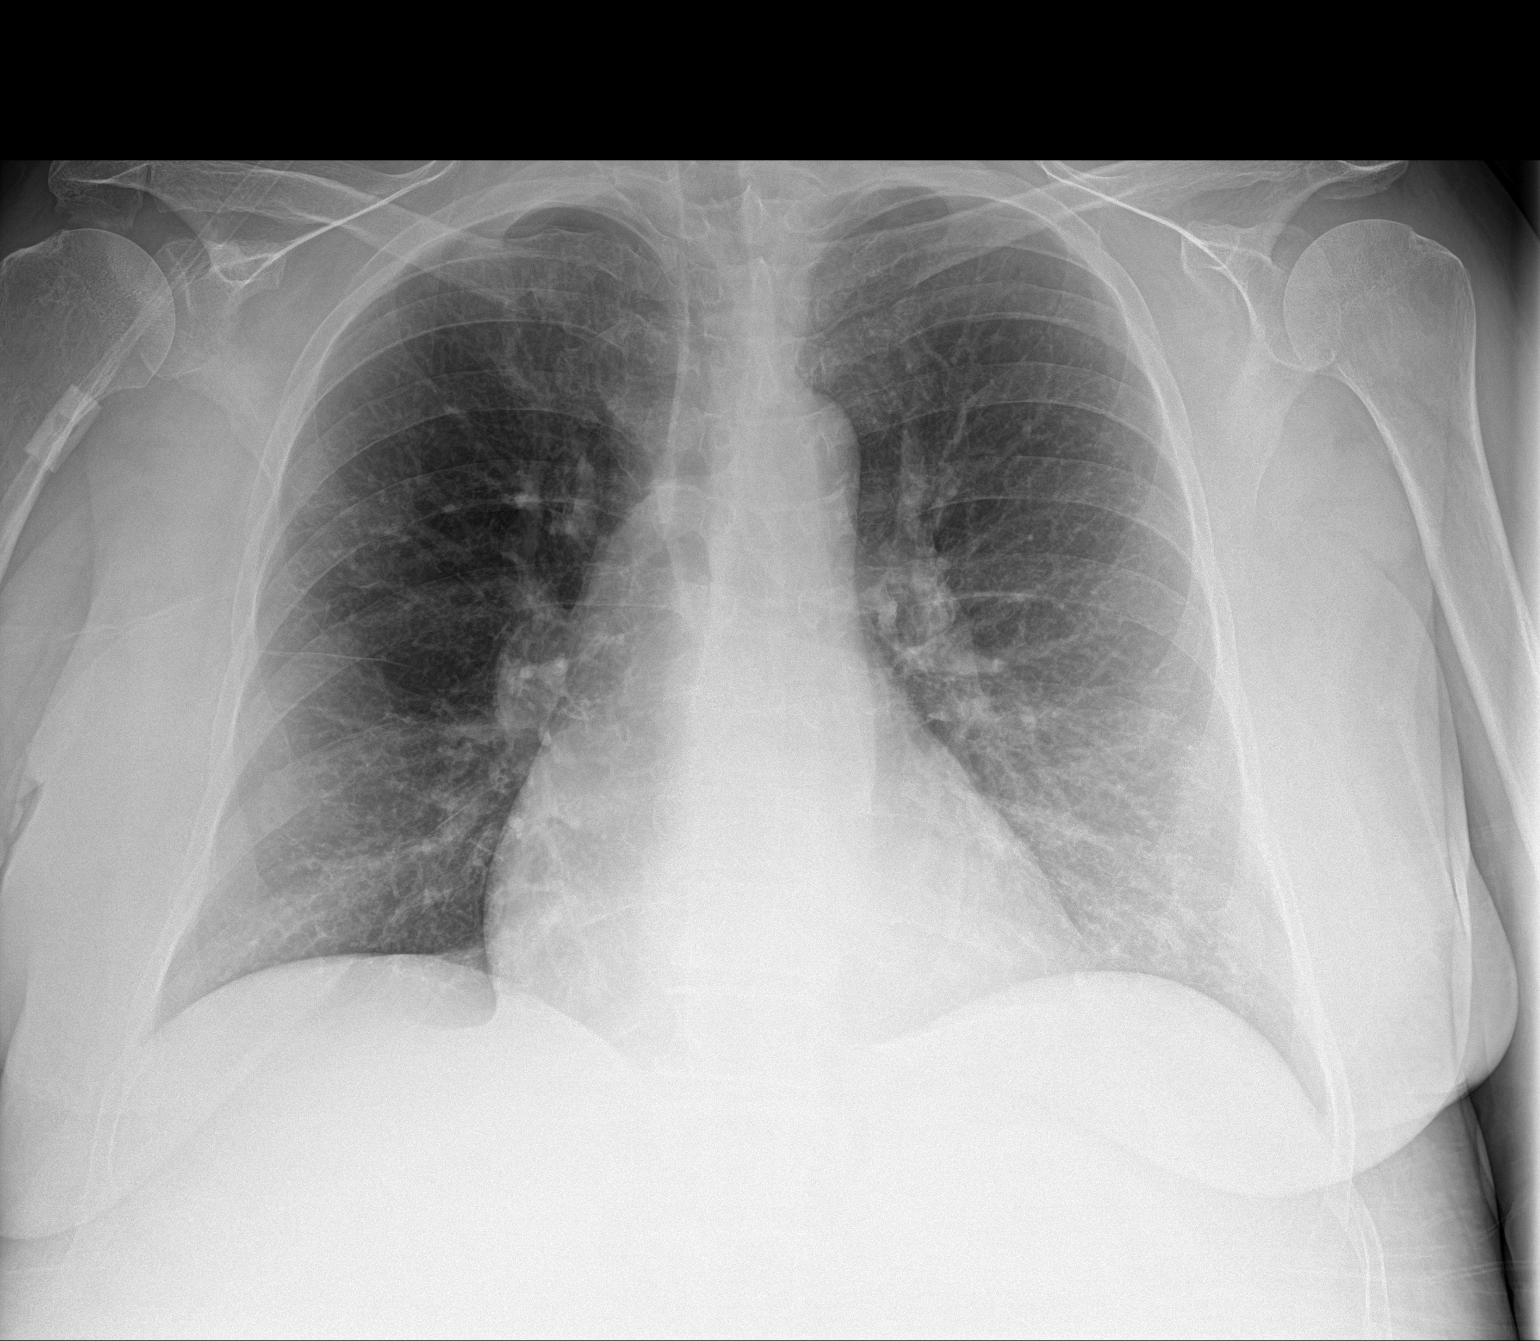

[1 of 1 positions shown; findings below may reference images not displayed]

FINDINGS: Normal sized heart. Clear lungs. Stable mild diffuse peribronchial
thickening and accentuation of the interstitial markings. Diffuse
osteopenia.
IMPRESSION: No acute abnormality. Stable mild chronic bronchitic changes.

## 2021-08-06 IMAGING — DX PORTABLE CHEST - 1 VIEW
1 series · 1 of 1 positions shown · non-contrast
Comparison: 09/23/2018

CLINICAL DATA: Chest pain

EXAM:
PORTABLE CHEST 1 VIEW

[chest ap]
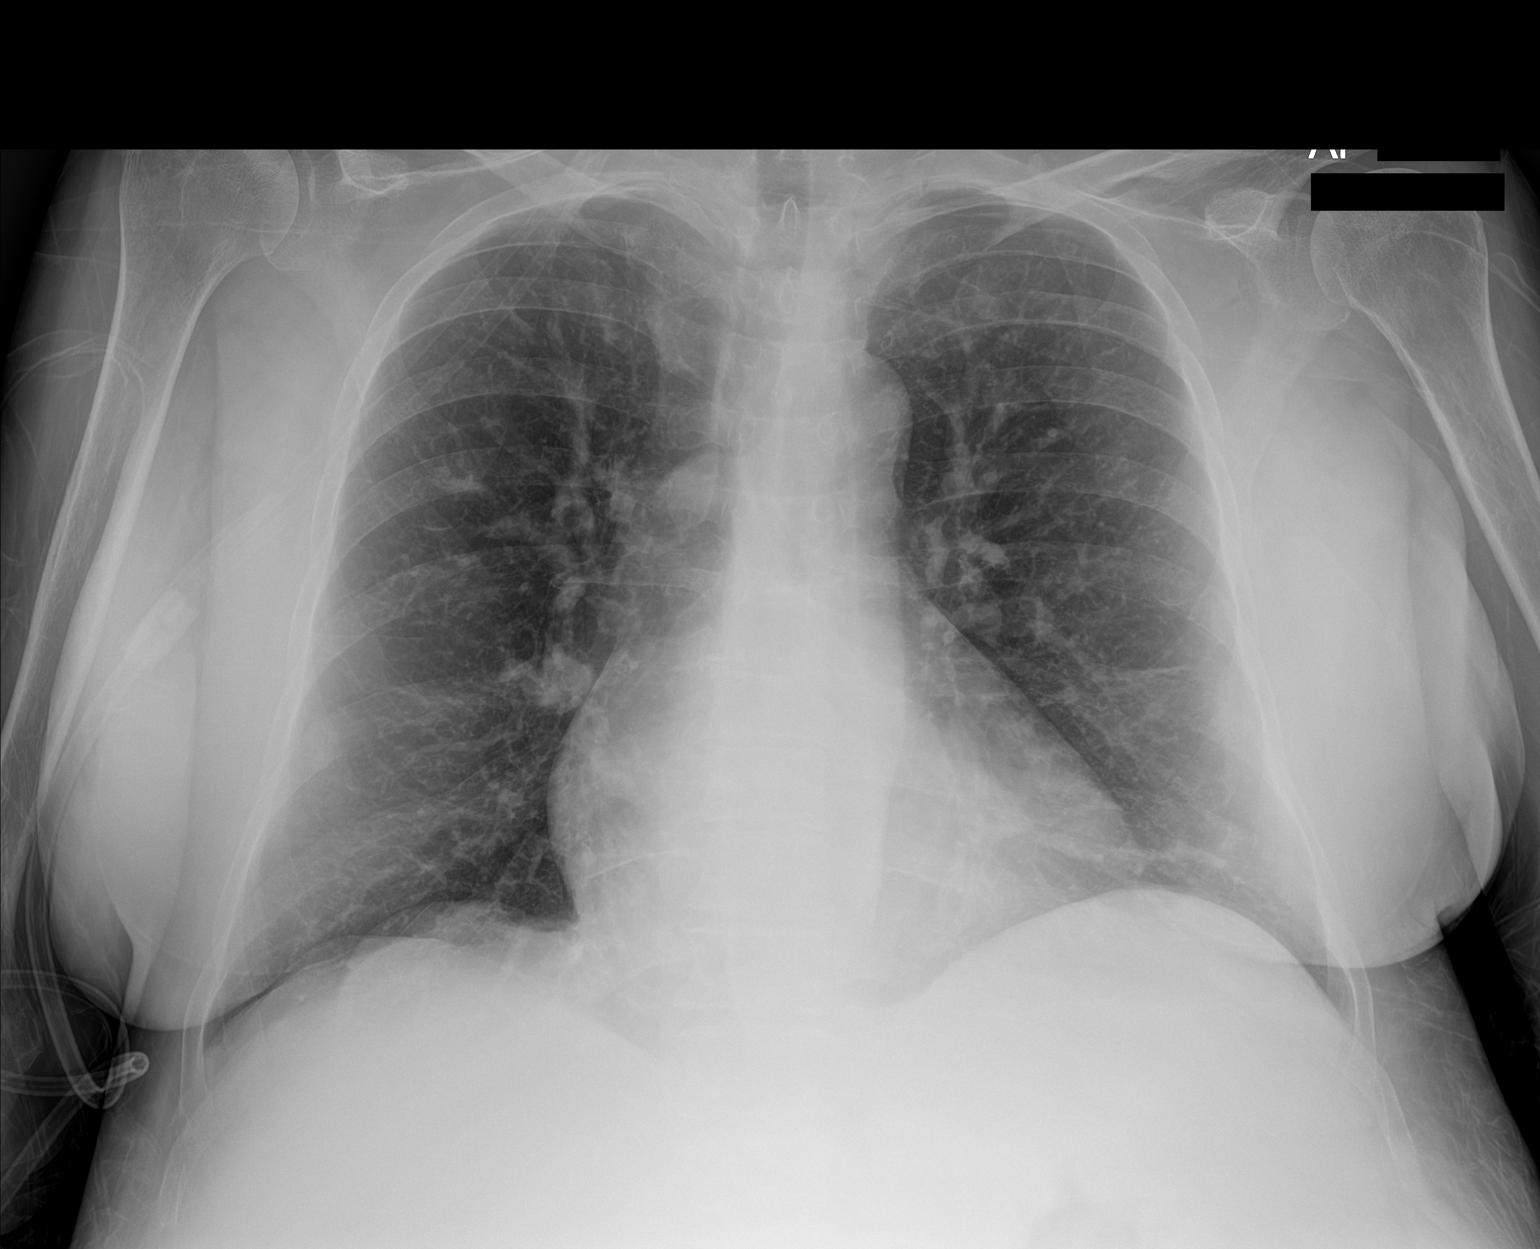

[1 of 1 positions shown; findings below may reference images not displayed]

FINDINGS: Heart and mediastinal contours are within normal limits. No focal
opacities or effusions. No acute bony abnormality.
IMPRESSION: No active disease.

## 2021-08-20 IMAGING — CT CT ABD-PELV W/O CM
2 of 4 series · 16 of 46 positions shown, 18 images · non-contrast
Comparison: CT the abdomen and pelvis 07/19/2018.

CLINICAL DATA: 58-year-old female with history of cirrhosis and
ascites presenting with a history of abdominal pain. Involuntary
commitment. Bipolar disorder.

EXAM:
CT ABDOMEN AND PELVIS WITHOUT CONTRAST
TECHNIQUE: Multidetector CT imaging of the abdomen and pelvis was performed
following the standard protocol without IV contrast.

[Series 2: axial st · axial · 0.83mm/px · z∈[-446,-36]mm · 13 of 92 slices shown, 15 images]
[im 5/92  soft-tissue]
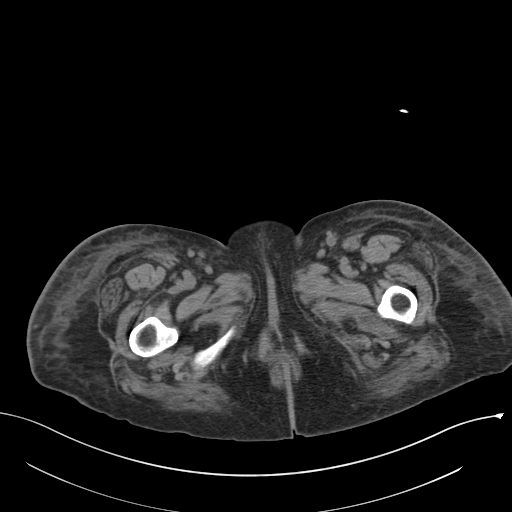
[im 5/92  bone]
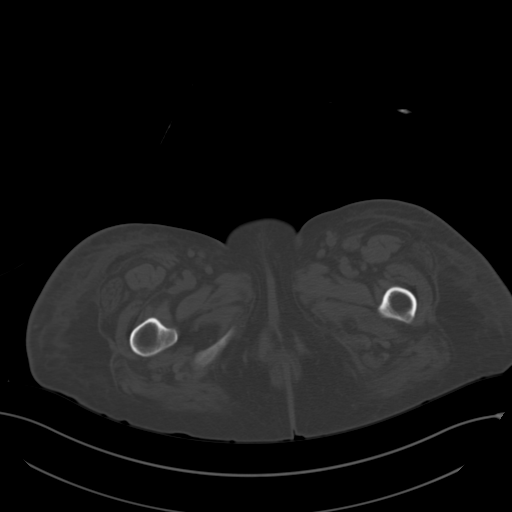
[im 15/92  soft-tissue]
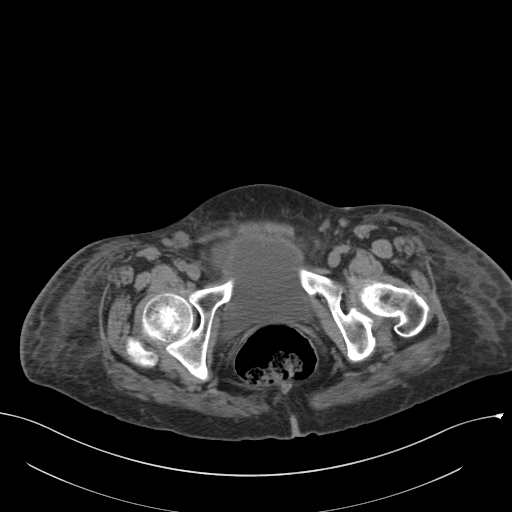
[im 20/92  soft-tissue]
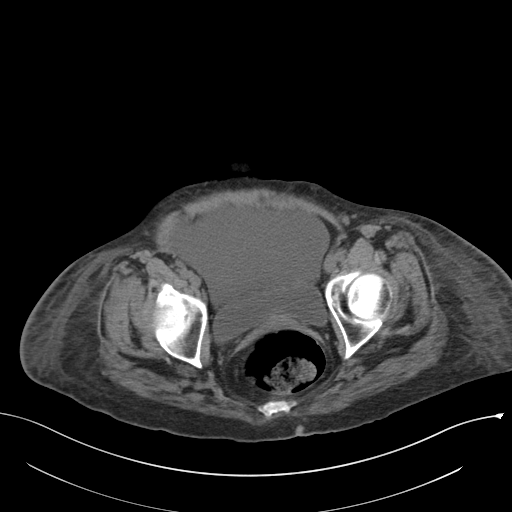
[im 24/92  soft-tissue]
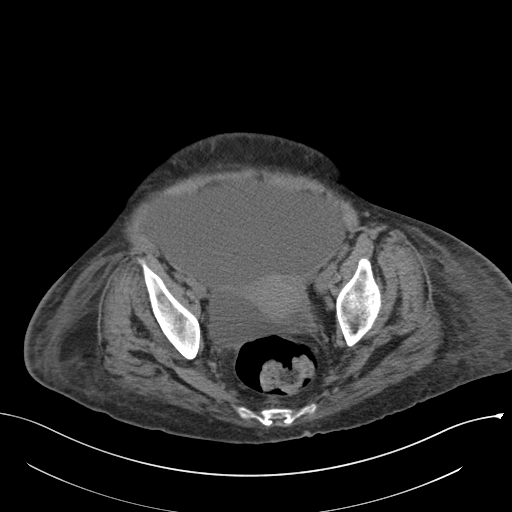
[im 34/92  soft-tissue]
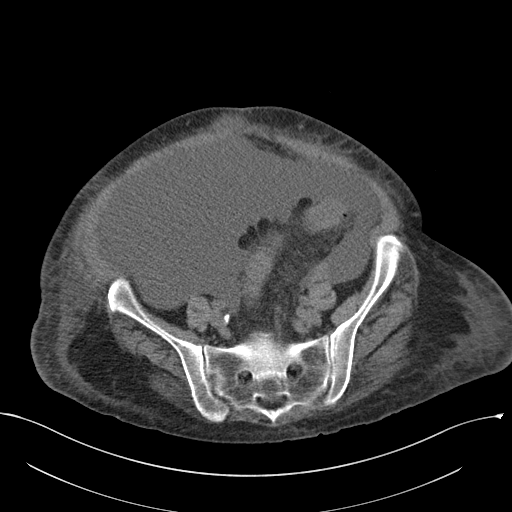
[im 39/92  soft-tissue]
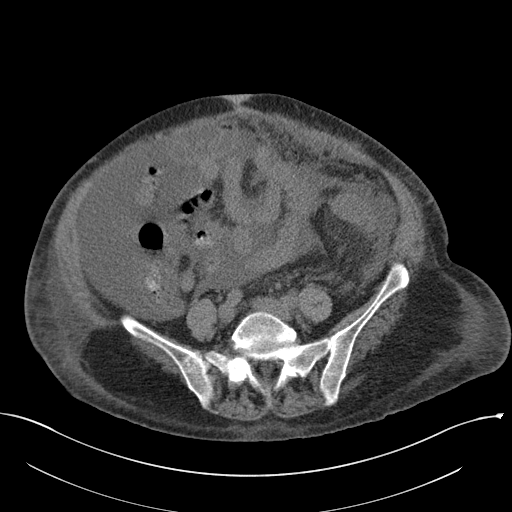
[im 48/92  soft-tissue]
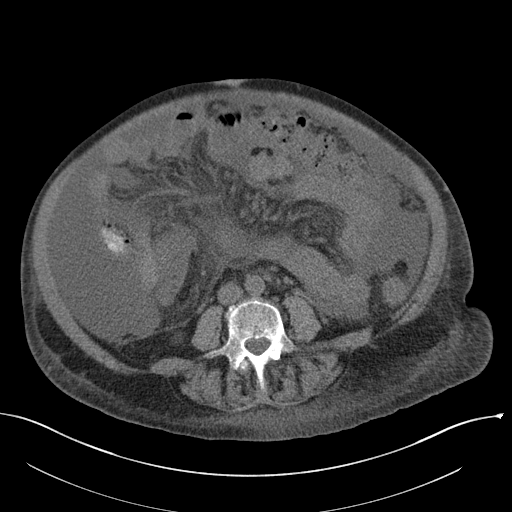
[im 53/92  soft-tissue]
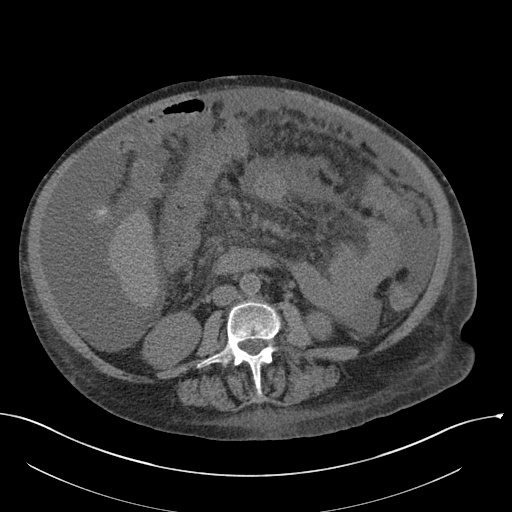
[im 58/92  soft-tissue]
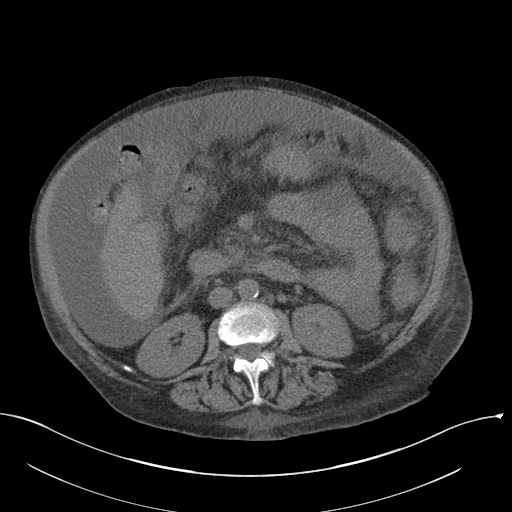
[im 58/92  bone]
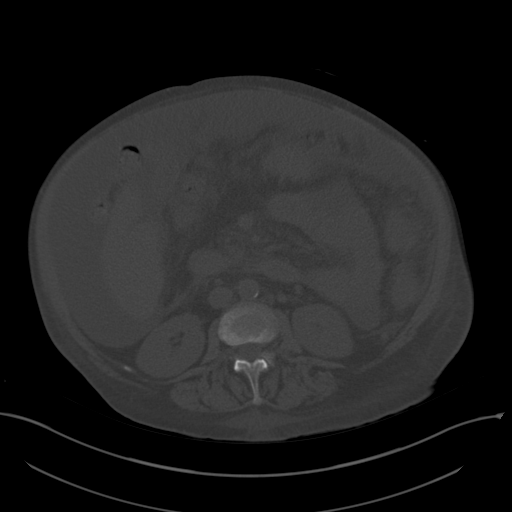
[im 68/92  soft-tissue]
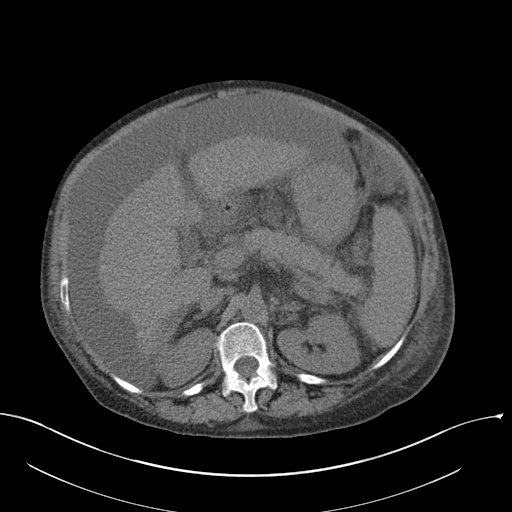
[im 72/92  soft-tissue]
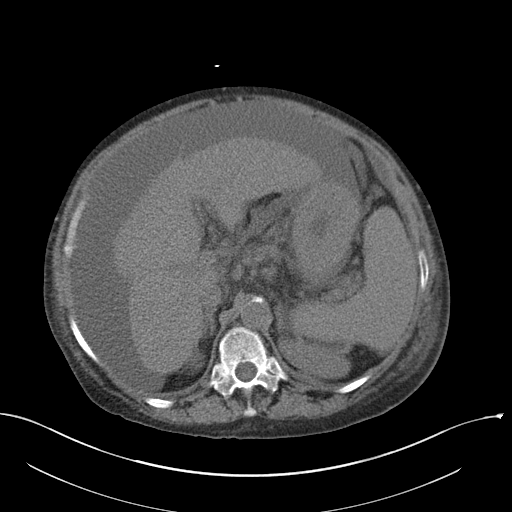
[im 77/92  soft-tissue]
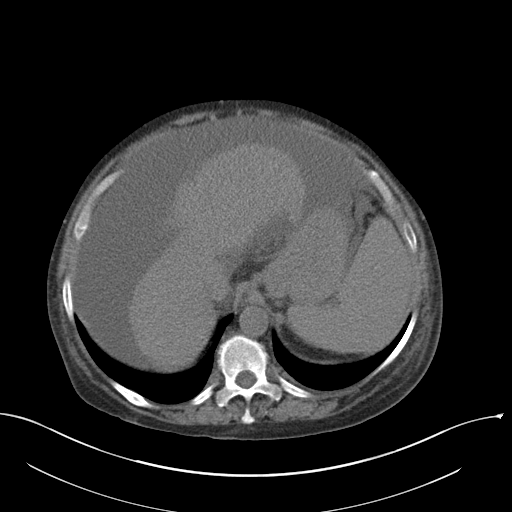
[im 87/92  soft-tissue]
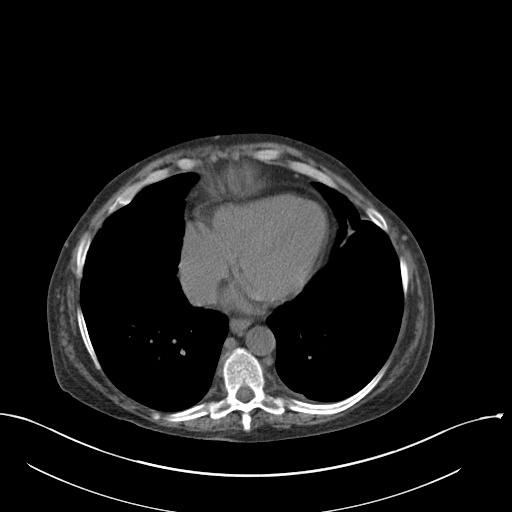

[Series 5: coronal st · coronal · 0.76mm/px · 3 of 108 slices shown]
[im 36/108  soft-tissue]
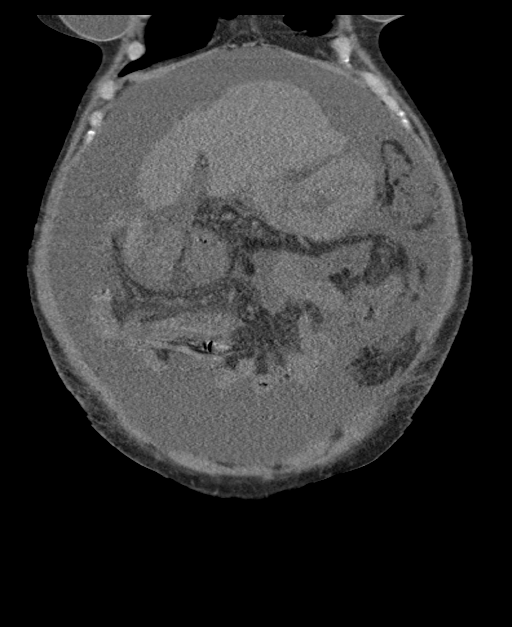
[im 48/108  soft-tissue]
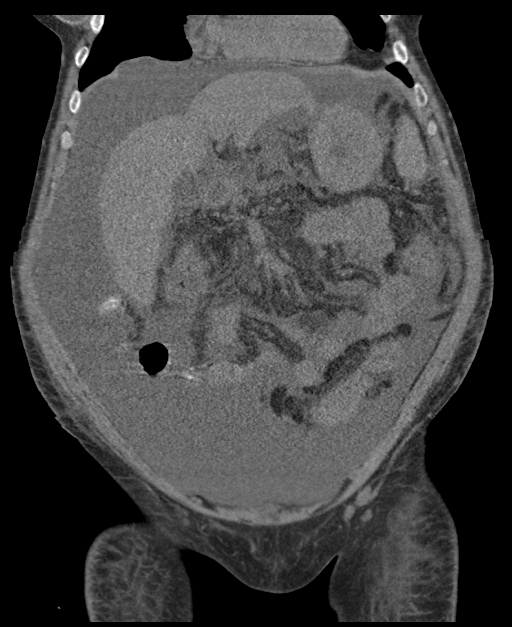
[im 60/108  soft-tissue]
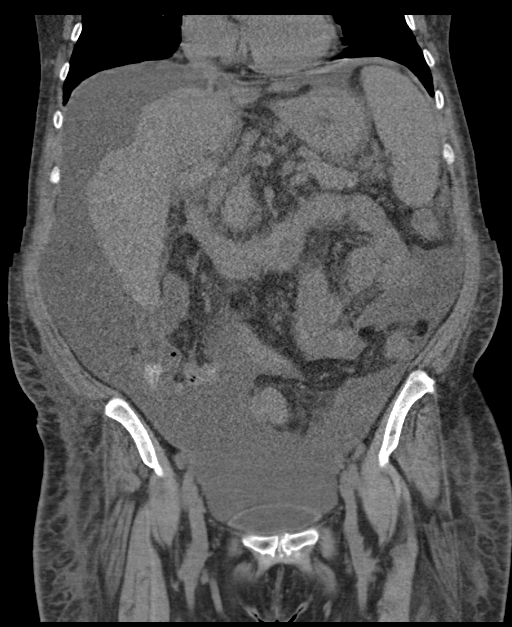

[16 of 46 positions shown; findings below may reference images not displayed]

FINDINGS: Lower chest: Bilateral breast implants are incidentally noted. Mild
multifocal scarring throughout the lung bases bilaterally.

Hepatobiliary: Liver has a severely shrunken appearance and nodular
contour, indicative of advanced cirrhosis. No discrete cystic or
solid hepatic lesions are confidently identified on today's
noncontrast CT examination. Unenhanced appearance of the gallbladder
is normal.

Pancreas: No definite pancreatic mass or peripancreatic fluid
collections or inflammatory changes noted on today's noncontrast CT
examination.

Spleen: Spleen appears borderline enlarged measuring 11.9 x 6.1 x
12.1 cm (estimated splenic volume of 439 mL).

Adrenals/Urinary Tract: Unenhanced appearance of the kidneys are
normal. No hydroureteronephrosis and bilateral adrenal glands.
Urinary bladder is normal in appearance.

Stomach/Bowel: Unenhanced appearance of the stomach is unremarkable.
No pathologic dilatation of small bowel or colon. Normal appendix.

Vascular/Lymphatic: Aortic atherosclerosis. No lymphadenopathy noted
in the abdomen or pelvis.

Reproductive: Uterus and ovaries are unremarkable in appearance.

Other: Large volume of ascites. No pneumoperitoneum. Mild diffuse
mesenteric edema. Mild diffuse retroperitoneal edema. Mild diffuse
body edema.

Musculoskeletal: There are no aggressive appearing lytic or blastic
lesions noted in the visualized portions of the skeleton.
IMPRESSION: 1. Severe cirrhosis with evidence of portal hypertension, including
borderline splenomegaly and large volume of ascites.
2. Ascites, diffuse mesenteric and retroperitoneal edema and body
wall edema, suggestive of a state of anasarca.
3. No other acute findings are noted in the abdomen or pelvis.

## 2021-08-20 IMAGING — DX DG CHEST 1V PORT
1 series · 1 of 1 positions shown · non-contrast
Comparison: Radiograph September 27, 2018.

CLINICAL DATA: Hypoxia.

EXAM:
PORTABLE CHEST 1 VIEW

[chest ap]
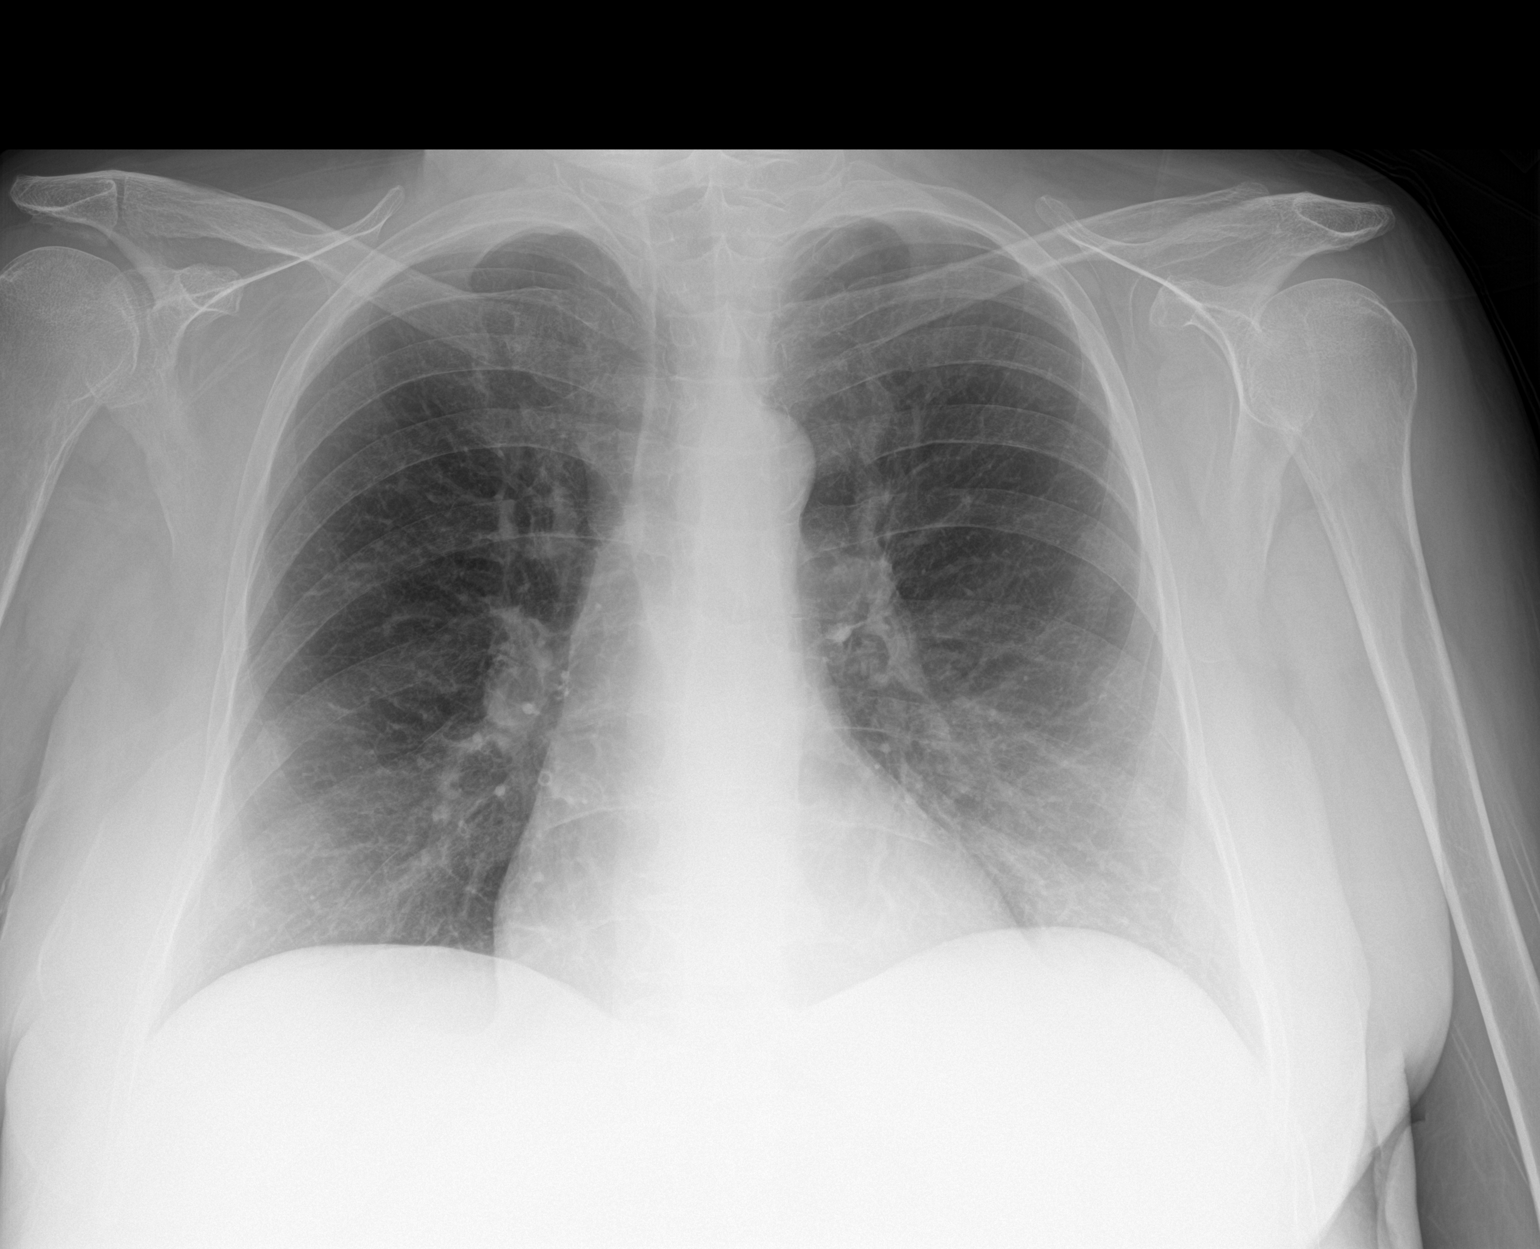

[1 of 1 positions shown; findings below may reference images not displayed]

FINDINGS: The heart size and mediastinal contours are within normal limits.
Both lungs are clear. No pneumothorax pleural effusion is noted. The
visualized skeletal structures are unremarkable.
IMPRESSION: No active disease.
